# Patient Record
Sex: Female | Born: 1950 | Race: Black or African American | Hispanic: No | Marital: Married | Smoking: Former smoker
Health system: Southern US, Community
[De-identification: ages and names within clinical notes are randomized; demographics above are authoritative.]

## PROBLEM LIST (undated history)

## (undated) DIAGNOSIS — J302 Other seasonal allergic rhinitis: Secondary | ICD-10-CM

## (undated) DIAGNOSIS — G629 Polyneuropathy, unspecified: Secondary | ICD-10-CM

## (undated) DIAGNOSIS — J189 Pneumonia, unspecified organism: Secondary | ICD-10-CM

## (undated) DIAGNOSIS — I10 Essential (primary) hypertension: Secondary | ICD-10-CM

## (undated) DIAGNOSIS — S0990XA Unspecified injury of head, initial encounter: Secondary | ICD-10-CM

## (undated) DIAGNOSIS — E113521 Type 2 diabetes mellitus with proliferative diabetic retinopathy with traction retinal detachment involving the macula, right eye: Secondary | ICD-10-CM

## (undated) DIAGNOSIS — Z8719 Personal history of other diseases of the digestive system: Secondary | ICD-10-CM

## (undated) DIAGNOSIS — D649 Anemia, unspecified: Secondary | ICD-10-CM

## (undated) DIAGNOSIS — Z9889 Other specified postprocedural states: Secondary | ICD-10-CM

## (undated) DIAGNOSIS — J45909 Unspecified asthma, uncomplicated: Secondary | ICD-10-CM

## (undated) DIAGNOSIS — M1990 Unspecified osteoarthritis, unspecified site: Secondary | ICD-10-CM

## (undated) DIAGNOSIS — R112 Nausea with vomiting, unspecified: Secondary | ICD-10-CM

## (undated) DIAGNOSIS — R0989 Other specified symptoms and signs involving the circulatory and respiratory systems: Secondary | ICD-10-CM

## (undated) DIAGNOSIS — N186 End stage renal disease: Secondary | ICD-10-CM

## (undated) DIAGNOSIS — IMO0001 Reserved for inherently not codable concepts without codable children: Secondary | ICD-10-CM

## (undated) DIAGNOSIS — K5792 Diverticulitis of intestine, part unspecified, without perforation or abscess without bleeding: Secondary | ICD-10-CM

## (undated) DIAGNOSIS — F419 Anxiety disorder, unspecified: Secondary | ICD-10-CM

## (undated) DIAGNOSIS — K219 Gastro-esophageal reflux disease without esophagitis: Secondary | ICD-10-CM

## (undated) DIAGNOSIS — T4145XA Adverse effect of unspecified anesthetic, initial encounter: Secondary | ICD-10-CM

## (undated) DIAGNOSIS — J449 Chronic obstructive pulmonary disease, unspecified: Secondary | ICD-10-CM

## (undated) DIAGNOSIS — G4730 Sleep apnea, unspecified: Secondary | ICD-10-CM

## (undated) DIAGNOSIS — M5090 Cervical disc disorder, unspecified, unspecified cervical region: Secondary | ICD-10-CM

## (undated) DIAGNOSIS — M810 Age-related osteoporosis without current pathological fracture: Secondary | ICD-10-CM

## (undated) DIAGNOSIS — K922 Gastrointestinal hemorrhage, unspecified: Secondary | ICD-10-CM

## (undated) DIAGNOSIS — E785 Hyperlipidemia, unspecified: Secondary | ICD-10-CM

## (undated) DIAGNOSIS — I739 Peripheral vascular disease, unspecified: Secondary | ICD-10-CM

## (undated) DIAGNOSIS — I509 Heart failure, unspecified: Secondary | ICD-10-CM

## (undated) DIAGNOSIS — T8859XA Other complications of anesthesia, initial encounter: Secondary | ICD-10-CM

## (undated) HISTORY — DX: Other specified symptoms and signs involving the circulatory and respiratory systems: R09.89

## (undated) HISTORY — PX: ABDOMINAL HYSTERECTOMY: SHX81

## (undated) HISTORY — DX: Type 2 diabetes mellitus with proliferative diabetic retinopathy with traction retinal detachment involving the macula, right eye: E11.3521

## (undated) HISTORY — PX: EYE SURGERY: SHX253

## (undated) HISTORY — DX: Age-related osteoporosis without current pathological fracture: M81.0

---

## 1998-06-14 ENCOUNTER — Emergency Department (HOSPITAL_COMMUNITY): Admission: EM | Admit: 1998-06-14 | Discharge: 1998-06-14 | Payer: Self-pay | Admitting: Emergency Medicine

## 1998-06-14 ENCOUNTER — Encounter: Payer: Self-pay | Admitting: Emergency Medicine

## 1998-06-17 ENCOUNTER — Encounter: Admission: RE | Admit: 1998-06-17 | Discharge: 1998-09-15 | Payer: Self-pay | Admitting: *Deleted

## 1998-07-03 ENCOUNTER — Emergency Department (HOSPITAL_COMMUNITY): Admission: EM | Admit: 1998-07-03 | Discharge: 1998-07-03 | Payer: Self-pay | Admitting: Emergency Medicine

## 1998-07-03 ENCOUNTER — Encounter: Payer: Self-pay | Admitting: Emergency Medicine

## 2000-12-12 ENCOUNTER — Emergency Department (HOSPITAL_COMMUNITY): Admission: EM | Admit: 2000-12-12 | Discharge: 2000-12-12 | Payer: Self-pay | Admitting: Emergency Medicine

## 2001-07-18 ENCOUNTER — Encounter: Payer: Self-pay | Admitting: Emergency Medicine

## 2001-07-18 ENCOUNTER — Emergency Department (HOSPITAL_COMMUNITY): Admission: EM | Admit: 2001-07-18 | Discharge: 2001-07-18 | Payer: Self-pay | Admitting: Emergency Medicine

## 2002-07-01 ENCOUNTER — Emergency Department (HOSPITAL_COMMUNITY): Admission: EM | Admit: 2002-07-01 | Discharge: 2002-07-01 | Payer: Self-pay | Admitting: Emergency Medicine

## 2002-07-26 ENCOUNTER — Encounter: Payer: Self-pay | Admitting: Urology

## 2002-07-31 ENCOUNTER — Observation Stay (HOSPITAL_COMMUNITY): Admission: AD | Admit: 2002-07-31 | Discharge: 2002-08-01 | Payer: Self-pay | Admitting: Urology

## 2005-06-10 ENCOUNTER — Emergency Department (HOSPITAL_COMMUNITY): Admission: EM | Admit: 2005-06-10 | Discharge: 2005-06-10 | Payer: Self-pay | Admitting: Emergency Medicine

## 2005-08-10 ENCOUNTER — Ambulatory Visit (HOSPITAL_COMMUNITY): Admission: RE | Admit: 2005-08-10 | Discharge: 2005-08-10 | Payer: Self-pay | Admitting: Cardiovascular Disease

## 2007-12-27 ENCOUNTER — Ambulatory Visit: Payer: Self-pay | Admitting: Internal Medicine

## 2007-12-27 DIAGNOSIS — J301 Allergic rhinitis due to pollen: Secondary | ICD-10-CM | POA: Insufficient documentation

## 2007-12-27 DIAGNOSIS — I10 Essential (primary) hypertension: Secondary | ICD-10-CM | POA: Insufficient documentation

## 2007-12-27 DIAGNOSIS — E1169 Type 2 diabetes mellitus with other specified complication: Secondary | ICD-10-CM | POA: Insufficient documentation

## 2007-12-27 DIAGNOSIS — J45909 Unspecified asthma, uncomplicated: Secondary | ICD-10-CM | POA: Insufficient documentation

## 2007-12-27 DIAGNOSIS — E119 Type 2 diabetes mellitus without complications: Secondary | ICD-10-CM | POA: Insufficient documentation

## 2007-12-27 DIAGNOSIS — Z8679 Personal history of other diseases of the circulatory system: Secondary | ICD-10-CM | POA: Insufficient documentation

## 2007-12-27 DIAGNOSIS — E785 Hyperlipidemia, unspecified: Secondary | ICD-10-CM

## 2008-01-03 ENCOUNTER — Ambulatory Visit: Payer: Self-pay | Admitting: Internal Medicine

## 2008-04-12 ENCOUNTER — Ambulatory Visit: Payer: Self-pay | Admitting: Internal Medicine

## 2008-05-10 ENCOUNTER — Telehealth: Payer: Self-pay | Admitting: Internal Medicine

## 2008-05-22 ENCOUNTER — Encounter: Payer: Self-pay | Admitting: Internal Medicine

## 2008-11-29 ENCOUNTER — Inpatient Hospital Stay (HOSPITAL_COMMUNITY): Admission: EM | Admit: 2008-11-29 | Discharge: 2008-12-04 | Payer: Self-pay | Admitting: Emergency Medicine

## 2008-12-06 ENCOUNTER — Encounter: Admission: RE | Admit: 2008-12-06 | Discharge: 2008-12-06 | Payer: Self-pay | Admitting: Internal Medicine

## 2009-01-09 ENCOUNTER — Encounter: Admission: RE | Admit: 2009-01-09 | Discharge: 2009-01-09 | Payer: Self-pay | Admitting: Internal Medicine

## 2009-10-15 ENCOUNTER — Emergency Department (HOSPITAL_COMMUNITY): Admission: EM | Admit: 2009-10-15 | Discharge: 2009-10-15 | Payer: Self-pay | Admitting: Emergency Medicine

## 2010-01-01 ENCOUNTER — Emergency Department (HOSPITAL_COMMUNITY): Admission: EM | Admit: 2010-01-01 | Discharge: 2010-01-01 | Payer: Self-pay | Admitting: Emergency Medicine

## 2010-08-17 ENCOUNTER — Encounter: Payer: Self-pay | Admitting: Internal Medicine

## 2010-08-26 NOTE — Assessment & Plan Note (Signed)
Summary: 2 MONTH ROV/NJR/resch with tp/jls   Vital Signs:  Patient Profile:   60 Years Old Female Height:     66 inches Weight:      150 pounds Temp:     98.3 degrees F oral Pulse rate:   117 / minute BP sitting:   160 / 120  (left arm) Cuff size:   regular  Vitals Entered By: Julious Payer (April 12, 2008 3:39 PM)                 Chief Complaint:  2 month rov.  History of Present Illness: 60 year old female seen today for follow-up of her diabetes, hypertension, and dyslipidemia.  Still no home blood sugar monitoring, blood pressure on arrival to that was quite high.  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  She has asthma which has been stable    Current Allergies: No known allergies   Past Medical History:    Reviewed history from 12/27/2007 and no changes required:       Asthma       Diabetes mellitus, type II       Hyperlipidemia       Hypertension       Allergic rhinitis   Family History:    Reviewed history from 12/27/2007 and no changes required:       father  age  35, unclear  health              Without a 74 history of hypertension; died of cancer, unclear type              one brother in good health    Review of Systems  The patient denies anorexia, fever, weight loss, weight gain, vision loss, decreased hearing, hoarseness, chest pain, syncope, dyspnea on exertion, peripheral edema, prolonged cough, headaches, hemoptysis, abdominal pain, melena, hematochezia, severe indigestion/heartburn, hematuria, incontinence, genital sores, muscle weakness, suspicious skin lesions, transient blindness, difficulty walking, depression, unusual weight change, abnormal bleeding, enlarged lymph nodes, angioedema, and breast masses.     Physical Exam  General:     well-developed.  repeat blood pressure 160/100 Head:     Normocephalic and atraumatic without obvious abnormalities. No apparent alopecia or balding. Eyes:     No corneal or  conjunctival inflammation noted. EOMI. Perrla. Funduscopic exam benign, without hemorrhages, exudates or papilledema. Vision grossly normal. Mouth:     Oral mucosa and oropharynx without lesions or exudates.  Teeth in good repair. Neck:     No deformities, masses, or tenderness noted. Lungs:     Normal respiratory effort, chest expands symmetrically. Lungs are clear to auscultation, no crackles or wheezes. Heart:     Normal rate and regular rhythm. S1 and S2 normal without gallop, murmur, click, rub or other extra sounds. Abdomen:     Bowel sounds positive,abdomen soft and non-tender without masses, organomegaly or hernias noted.    Impression & Recommendations:  Problem # 1:  HYPERTENSION (ICD-401.9)  The following medications were removed from the medication list:    Lisinopril-hydrochlorothiazide 20-25 Mg Tabs (Lisinopril-hydrochlorothiazide) ..... One daily  Her updated medication list for this problem includes:    Lisinopril-hydrochlorothiazide 20-12.5 Mg Tabs (Lisinopril-hydrochlorothiazide) .Marland Kitchen..Marland Kitchen Two daily compliance issues stressed  Problem # 2:  DIABETES MELLITUS, TYPE II (ICD-250.00)  The following medications were removed from the medication list:    Lisinopril-hydrochlorothiazide 20-25 Mg Tabs (Lisinopril-hydrochlorothiazide) ..... One daily  Her updated medication list for this problem includes:    Glimepiride 4 Mg  Tabs (Glimepiride) ..... One every am    Metformin Hcl 500 Mg Tb24 (Metformin hcl) .Marland Kitchen... 2  twice daily    Lisinopril-hydrochlorothiazide 20-12.5 Mg Tabs (Lisinopril-hydrochlorothiazide) .Marland Kitchen..Marland Kitchen Two daily a complementary glucometer, and new prescriptions for test strips and lancets, dispensed   Problem # 3:  ASTHMA (ICD-493.90)  Her updated medication list for this problem includes:    Proair Hfa 108 (90 Base) Mcg/act Aers (Albuterol sulfate) .Marland Kitchen... As directed   Problem # 4:  HYPERLIPIDEMIA (B2193296.4)  Her updated medication list for this problem  includes:    Simvastatin 40 Mg Tabs (Simvastatin) ..... One daily   Complete Medication List: 1)  Amlodipine Besylate 10 Mg Tabs (amlodipine Besylate)  .... One daily 2)  Glimepiride 4 Mg Tabs (Glimepiride) .... One every am 3)  Metformin Hcl 500 Mg Tb24 (Metformin hcl) .... 2  twice daily 4)  Simvastatin 40 Mg Tabs (Simvastatin) .... One daily 5)  Proair Hfa 108 (90 Base) Mcg/act Aers (Albuterol sulfate) .... As directed 6)  Lisinopril-hydrochlorothiazide 20-12.5 Mg Tabs (Lisinopril-hydrochlorothiazide) .... Two daily 7)  Freestyle Lite Test Strp (Glucose blood) .... Use daily 8)  Monolet Lancets Misc (Lancets) .... Use as directed   Patient Instructions: 1)  Please schedule a follow-up appointment in 1 month. 2)  Limit your Sodium (Salt) to less than 2 grams a day(slightly less than 1/2 a teaspoon) to prevent fluid retention, swelling, or worsening of symptoms. 3)  Check your blood sugars regularly. If your readings are usually above : or below 70 you should contact our office. 4)  It is important that your Diabetic A1c level is checked every 3 months. 5)  See your eye doctor yearly to check for diabetic eye damage.   Prescriptions: MONOLET LANCETS  MISC (LANCETS) use as directed  #90 x 6   Entered and Authorized by:   Marletta Lor  MD   Signed by:   Marletta Lor  MD on 04/12/2008   Method used:   Print then Give to Patient   RxID:   [id number] FREESTYLE LITE TEST  STRP (GLUCOSE BLOOD) use daily  #90 x 6   Entered and Authorized by:   Marletta Lor  MD   Signed by:   Marletta Lor  MD on 04/12/2008   Method used:   Print then Give to Patient   RxID:   [id number] LISINOPRIL-HYDROCHLOROTHIAZIDE 20-12.5 MG TABS (LISINOPRIL-HYDROCHLOROTHIAZIDE) two daily  #180 x 6   Entered and Authorized by:   Marletta Lor  MD   Signed by:   Marletta Lor  MD on 04/12/2008   Method used:   Print then Give to Patient   [id number]  ]

## 2010-08-26 NOTE — Letter (Signed)
Summary: [facility]  [facility]   Imported By: Laural Benes 06/14/2008 13:47:16  _____________________________________________________________________  External Attachment:    Type:   Image     Comment:   External Document

## 2010-08-26 NOTE — Assessment & Plan Note (Signed)
Summary: new pt to establish/[employer]/CCM   Vital Signs:  Patient Profile:   60 Years Old Female Height:     66 inches Weight:      158 pounds Temp:     97.2 degrees F oral BP sitting:   220 / 110  (left arm) Cuff size:   regular  Vitals Entered By: Nira Conn LPN (June  2, 579FGE 579FGE PM)                 Chief Complaint:  to establish and HBP concerns.  History of Present Illness: 60 year old, black female, seen today to establish with our practice.  She has a long history of type 2 diabetes, asthma, hypertension, and dyslipidemia.  There has been much confusion about  her medications and difficulty with compliance due to cost issues. She does receive annual gynecologic checks mammograms and eye exams.  She states that she does not tolerate clonidine due to drowsiness.  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      Physical Exam  General:     Well-developed,well-nourished,in no acute distress; alert,appropriate and cooperative throughout examination ;blood pressure 200 100 Head:     Normocephalic and atraumatic without obvious abnormalities. No apparent alopecia or balding. Eyes:     No corneal or conjunctival inflammation noted. EOMI. Perrla. Funduscopic exam benign, without hemorrhages, exudates or papilledema. Vision grossly normal. Ears:     External ear exam shows no significant lesions or deformities.  Otoscopic examination reveals clear canals, tympanic membranes are intact bilaterally without bulging, retraction, inflammation or discharge. Hearing is grossly normal bilaterally. Mouth:     scattered dentition in poor repair Neck:     No deformities, masses, or tenderness noted. Chest Wall:     No deformities, masses, or tenderness noted. Breasts:     No mass, nodules, thickening, tenderness, bulging, retraction, inflamation, nipple discharge or skin changes noted.   Lungs:     Normal respiratory effort, chest expands symmetrically. Lungs are clear to auscultation, no crackles or wheezes. Heart:     sinus tachycardia, no gallop Abdomen:     Bowel sounds positive,abdomen soft and non-tender without masses, organomegaly or hernias noted. Msk:     No deformity or scoliosis noted of thoracic or lumbar spine.   Pulses:     R and L  carotid,radial,femoral,dorsalis pedis and posterior tibial pulses are full and equal bilaterally Extremities:     No clubbing, cyanosis, edema, or deformity noted with normal full range of motion of all joints.      Impression & Recommendations:  Problem # 1:  HYPERTENSION, HX OF (ICD-V12.50)  Orders: EKG w/ Interpretation (93000) extremely poor control; suspect  compliance with her medications.  A huge issue, both in terms of cost, and side effects  Problem # 2:  DIABETES MELLITUS, TYPE II (ICD-250.00)  Her updated medication list for this problem  includes:    Lisinopril-hydrochlorothiazide 20-25 Mg Tabs (Lisinopril-hydrochlorothiazide) ..... One daily    Glimepiride 4 Mg Tabs (Glimepiride) ..... One every am    Metformin Hcl 500 Mg Tb24 (Metformin hcl) .Marland Kitchen... 2  twice daily poor  control;  Orders: Glucose, (CBG) [id number])   Problem # 3:  HYPERLIPIDEMIA (ICD-272.4)  Her updated medication list for this problem includes:    Simvastatin 40 Mg Tabs (Simvastatin) ..... One daily   Complete Medication List: 1)  Lisinopril-hydrochlorothiazide 20-25 Mg Tabs (Lisinopril-hydrochlorothiazide) .... One daily 2)  Amlodipine Besylate 5 Mg Tabs (Amlodipine besylate) .... One daily 3)  Glimepiride 4 Mg Tabs (Glimepiride) .... One every am 4)  Metformin Hcl 500 Mg Tb24 (Metformin hcl) .... 2  twice daily 5)  Simvastatin 40 Mg Tabs (Simvastatin) .... One daily 6)  Proair Hfa 108 (90 Base) Mcg/act Aers (Albuterol sulfate) .... As directed   Patient Instructions: 1)  return office visit in one week 2)  Limit your Sodium (Salt) to less than 2 grams a day(slightly less than 1/2 a teaspoon) to prevent fluid retention, swelling, or worsening of symptoms. 3)  It is important that you exercise regularly at least 20 minutes 5 times a week. If you develop chest pain, have severe difficulty breathing, or feel very tired , stop exercising immediately and seek medical attention. 4)  Schedule a colonoscopy/sigmoidoscopy to help detect colon cancer. 5)  Check your blood sugars regularly. If your readings are usually above : or below 70 you should contact our office. 6)  It is important that your Diabetic A1c level is checked every 3 months. 7)  See your eye doctor yearly to check for diabetic eye damage.   Prescriptions: PROAIR HFA 108 (90 BASE) MCG/ACT  AERS (ALBUTEROL SULFATE) as directed  #3 x 4   Entered and Authorized by:   Marletta Lor  MD   Signed by:   Marletta Lor  MD on 12/27/2007   Method used:   Print then Give to Patient   RxID:    [id number] SIMVASTATIN 40 MG  TABS (SIMVASTATIN) one daily  #90 x 4   Entered and Authorized by:   Marletta Lor  MD   Signed by:   Marletta Lor  MD on 12/27/2007   Method used:   Print then Give to Patient   RxID:   [id number] METFORMIN HCL 500 MG  TB24 (METFORMIN HCL) 2  twice daily  #180 x 4   Entered and Authorized by:   Marletta Lor  MD   Signed by:   Marletta Lor  MD on 12/27/2007   Method used:   Print then Give to Patient   RxID:   [id number] GLIMEPIRIDE 4 MG  TABS (GLIMEPIRIDE) one every am  #90 x 4   Entered and Authorized by:   Marletta Lor  MD   Signed by:   Marletta Lor  MD on 12/27/2007  Method used:   Print then Give to Patient   RxID:   [id number] AMLODIPINE BESYLATE 5 MG  TABS (AMLODIPINE BESYLATE) one daily  #90 x 4   Entered and Authorized by:   Marletta Lor  MD   Signed by:   Marletta Lor  MD on 12/27/2007   Method used:   Print then Give to Patient   RxID:   [id number] LISINOPRIL-HYDROCHLOROTHIAZIDE 20-25 MG  TABS (LISINOPRIL-HYDROCHLOROTHIAZIDE) one daily  #90 x 4   Entered and Authorized by:   Marletta Lor  MD   Signed by:   Marletta Lor  MD on 12/27/2007   Method used:   Print then Give to Patient   RxID:   [id number]  ]

## 2010-08-26 NOTE — Assessment & Plan Note (Signed)
Summary: 1 wk f/up//db   Vital Signs:  Patient Profile:   60 Years Old Female Height:     66 inches Weight:      158 pounds Temp:     98.3 degrees F Pulse rate:   128 / minute Pulse rhythm:   irregular BP sitting:   180 / 78  (left arm) Cuff size:   regular  Vitals Entered By: Chipper Oman, RN (January 03, 2008 3:22 PM)                 Chief Complaint:  1 week ROV.Marland Kitchen  History of Present Illness: 60 year old female seen today for follow-up of her hypertension and type 2 diabetes.  Has no test strips to check blood sugars.  She does feel well today    Current Allergies: No known allergies       Physical Exam  General:     Well-developed,well-nourished,in no acute distress; alert,appropriate and cooperative throughout examination;  somewhat anxious, but pressure 160/80 Head:     Normocephalic and atraumatic without obvious abnormalities. No apparent alopecia or balding. Neck:     No deformities, masses, or tenderness noted. Lungs:     Normal respiratory effort, chest expands symmetrically. Lungs are clear to auscultation, no crackles or wheezes. Heart:     Normal rate and regular rhythm. S1 and S2 normal without gallop, murmur, click, rub or other extra sounds. regular tachycardia with rate of 110    Impression & Recommendations:  Problem # 1:  HYPERTENSION (ICD-401.9)  Her updated medication list for this problem includes:    Lisinopril-hydrochlorothiazide 20-25 Mg Tabs (Lisinopril-hydrochlorothiazide) ..... One daily   Problem # 2:  DIABETES MELLITUS, TYPE II (ICD-250.00)  Her updated medication list for this problem includes:    Lisinopril-hydrochlorothiazide 20-25 Mg Tabs (Lisinopril-hydrochlorothiazide) ..... One daily    Glimepiride 4 Mg Tabs (Glimepiride) ..... One every am    Metformin Hcl 500 Mg Tb24 (Metformin hcl) .Marland Kitchen... 2  twice daily   Complete Medication List: 1)  Lisinopril-hydrochlorothiazide 20-25 Mg Tabs (Lisinopril-hydrochlorothiazide)  .... One daily 2)  Amlodipine Besylate 10 Mg Tabs (amlodipine Besylate)  .... One daily 3)  Glimepiride 4 Mg Tabs (Glimepiride) .... One every am 4)  Metformin Hcl 500 Mg Tb24 (Metformin hcl) .... 2  twice daily 5)  Simvastatin 40 Mg Tabs (Simvastatin) .... One daily 6)  Proair Hfa 108 (90 Base) Mcg/act Aers (Albuterol sulfate) .... As directed   Patient Instructions: 1)  Please schedule a follow-up appointment in 2 months. 2)  Limit your Sodium (Salt) to less than 2 grams a day(slightly less than 1/2 a teaspoon) to prevent fluid retention, swelling, or worsening of symptoms. 3)  It is important that you exercise regularly at least 20 minutes 5 times a week. If you develop chest pain, have severe difficulty breathing, or feel very tired , stop exercising immediately and seek medical attention. 4)  You need to lose weight. Consider a lower calorie diet and regular exercise.  5)  Check your blood sugars regularly. If your readings are usually above : or below 70 you should contact our office. 6)  It is important that your Diabetic A1c level is checked every 3 months. 7)  See your eye doctor yearly to check for diabetic eye damage.   Prescriptions: AMLODIPINE BESYLATE 10 MG  TABS (AMLODIPINE BESYLATE) one daily  #90 x 6   Entered and Authorized by:   Marletta Lor  MD   Signed by:   Collier Salina  Sherwood Gambler  MD on 01/03/2008   Method used:   Print then Give to Patient   RxID:   [id number]  ]

## 2010-10-13 LAB — URINE MICROSCOPIC-ADD ON

## 2010-10-13 LAB — DIFFERENTIAL
Basophils Absolute: 0 10*3/uL (ref 0.0–0.1)
Basophils Relative: 1 % (ref 0–1)
Eosinophils Absolute: 0.1 10*3/uL (ref 0.0–0.7)
Eosinophils Relative: 1 % (ref 0–5)
Lymphocytes Relative: 30 % (ref 12–46)
Lymphs Abs: 2.3 10*3/uL (ref 0.7–4.0)
Monocytes Absolute: 0.4 10*3/uL (ref 0.1–1.0)
Monocytes Relative: 5 % (ref 3–12)
Neutro Abs: 4.9 10*3/uL (ref 1.7–7.7)
Neutrophils Relative %: 64 % (ref 43–77)

## 2010-10-13 LAB — BASIC METABOLIC PANEL
BUN: 8 mg/dL (ref 6–23)
CO2: 35 mEq/L — ABNORMAL HIGH (ref 19–32)
Calcium: 9.1 mg/dL (ref 8.4–10.5)
Chloride: 91 mEq/L — ABNORMAL LOW (ref 96–112)
Creatinine, Ser: 0.52 mg/dL (ref 0.4–1.2)
GFR calc Af Amer: >60 mL/min (ref >60)
GFR calc non Af Amer: >60 mL/min (ref >60)
Glucose, Bld: 175 mg/dL — ABNORMAL HIGH (ref 70–99)
Potassium: 3 mEq/L — ABNORMAL LOW (ref 3.5–5.1)
Sodium: 133 mEq/L — ABNORMAL LOW (ref 135–145)

## 2010-10-13 LAB — URINALYSIS, ROUTINE W REFLEX MICROSCOPIC
Bilirubin Urine: NEGATIVE
Glucose, UA: NEGATIVE mg/dL
Hgb urine dipstick: NEGATIVE
Ketones, ur: NEGATIVE mg/dL
Leukocytes, UA: NEGATIVE
Nitrite: NEGATIVE
Protein, ur: 100 mg/dL — AB
Specific Gravity, Urine: 1.012 (ref 1.005–1.030)
Urobilinogen, UA: 1 mg/dL (ref 0.0–1.0)
pH: 8 (ref 5.0–8.0)

## 2010-10-13 LAB — CBC
HCT: 40.6 % (ref 36.0–46.0)
Hemoglobin: 13.6 g/dL (ref 12.0–15.0)
MCHC: 33.5 g/dL (ref 30.0–36.0)
MCV: 90.8 fL (ref 78.0–100.0)
Platelets: 401 10*3/uL — ABNORMAL HIGH (ref 150–400)
RBC: 4.47 MIL/uL (ref 3.87–5.11)
RDW: 12.6 % (ref 11.5–15.5)
WBC: 7.7 10*3/uL (ref 4.0–10.5)

## 2010-10-20 LAB — POCT I-STAT, CHEM 8
BUN: 7 mg/dL (ref 6–23)
Calcium, Ion: 1.07 mmol/L — ABNORMAL LOW (ref 1.12–1.32)
Chloride: 92 mEq/L — ABNORMAL LOW (ref 96–112)
Creatinine, Ser: 0.7 mg/dL (ref 0.4–1.2)
Glucose, Bld: 394 mg/dL — ABNORMAL HIGH (ref 70–99)
HCT: 43 % (ref 36.0–46.0)
Hemoglobin: 14.6 g/dL (ref 12.0–15.0)
Potassium: 3.5 mEq/L (ref 3.5–5.1)
Sodium: 132 mEq/L — ABNORMAL LOW (ref 135–145)
TCO2: 38 mmol/L (ref 0–100)

## 2010-10-20 LAB — GLUCOSE, CAPILLARY: Glucose-Capillary: 394 mg/dL — ABNORMAL HIGH (ref 70–99)

## 2010-10-24 ENCOUNTER — Observation Stay (HOSPITAL_COMMUNITY)
Admission: EM | Admit: 2010-10-24 | Discharge: 2010-10-25 | Disposition: A | Discharge status: Home / Self Care | Payer: BC Managed Care – PPO | Attending: Emergency Medicine | Admitting: Emergency Medicine

## 2010-10-24 ENCOUNTER — Emergency Department (HOSPITAL_COMMUNITY): Payer: BC Managed Care – PPO

## 2010-10-24 ENCOUNTER — Encounter (HOSPITAL_COMMUNITY): Payer: Self-pay | Admitting: Radiology

## 2010-10-24 DIAGNOSIS — Z9119 Patient's noncompliance with other medical treatment and regimen: Secondary | ICD-10-CM | POA: Insufficient documentation

## 2010-10-24 DIAGNOSIS — E119 Type 2 diabetes mellitus without complications: Principal | ICD-10-CM | POA: Insufficient documentation

## 2010-10-24 DIAGNOSIS — R1110 Vomiting, unspecified: Secondary | ICD-10-CM | POA: Insufficient documentation

## 2010-10-24 DIAGNOSIS — R0602 Shortness of breath: Secondary | ICD-10-CM | POA: Insufficient documentation

## 2010-10-24 DIAGNOSIS — Z794 Long term (current) use of insulin: Secondary | ICD-10-CM | POA: Insufficient documentation

## 2010-10-24 DIAGNOSIS — Z91199 Patient's noncompliance with other medical treatment and regimen due to unspecified reason: Secondary | ICD-10-CM | POA: Insufficient documentation

## 2010-10-24 DIAGNOSIS — E876 Hypokalemia: Secondary | ICD-10-CM | POA: Insufficient documentation

## 2010-10-24 DIAGNOSIS — E860 Dehydration: Secondary | ICD-10-CM | POA: Insufficient documentation

## 2010-10-24 HISTORY — DX: Heart failure, unspecified: I50.9

## 2010-10-24 LAB — DIFFERENTIAL
Basophils Absolute: 0 10*3/uL (ref 0.0–0.1)
Basophils Relative: 0 % (ref 0–1)
Eosinophils Absolute: 0.1 10*3/uL (ref 0.0–0.7)
Eosinophils Relative: 1 % (ref 0–5)
Lymphocytes Relative: 32 % (ref 12–46)
Lymphs Abs: 2.9 10*3/uL (ref 0.7–4.0)
Monocytes Absolute: 0.5 10*3/uL (ref 0.1–1.0)
Monocytes Relative: 6 % (ref 3–12)
Neutro Abs: 5.7 10*3/uL (ref 1.7–7.7)
Neutrophils Relative %: 62 % (ref 43–77)

## 2010-10-24 LAB — COMPREHENSIVE METABOLIC PANEL
ALT: 27 U/L (ref 0–35)
AST: 21 U/L (ref 0–37)
Albumin: 2.8 g/dL — ABNORMAL LOW (ref 3.5–5.2)
Alkaline Phosphatase: 306 U/L — ABNORMAL HIGH (ref 39–117)
BUN: 15 mg/dL (ref 6–23)
CO2: 37 mEq/L — ABNORMAL HIGH (ref 19–32)
Calcium: 8.4 mg/dL (ref 8.4–10.5)
Chloride: 84 mEq/L — ABNORMAL LOW (ref 96–112)
Creatinine, Ser: 1.17 mg/dL (ref 0.4–1.2)
GFR calc Af Amer: 57 mL/min — ABNORMAL LOW (ref >60)
GFR calc non Af Amer: 47 mL/min — ABNORMAL LOW (ref >60)
Glucose, Bld: 456 mg/dL — ABNORMAL HIGH (ref 70–99)
Potassium: 2.7 mEq/L — CL (ref 3.5–5.1)
Sodium: 128 mEq/L — ABNORMAL LOW (ref 135–145)
Total Bilirubin: 0.4 mg/dL (ref 0.3–1.2)
Total Protein: 6.1 g/dL (ref 6.0–8.3)

## 2010-10-24 LAB — URINALYSIS, ROUTINE W REFLEX MICROSCOPIC
Bilirubin Urine: NEGATIVE
Glucose, UA: >1000 mg/dL — AB
Hgb urine dipstick: NEGATIVE
Ketones, ur: NEGATIVE mg/dL
Leukocytes, UA: NEGATIVE
Nitrite: NEGATIVE
Protein, ur: >300 mg/dL — AB
Specific Gravity, Urine: 1.023 (ref 1.005–1.030)
Urobilinogen, UA: 1 mg/dL (ref 0.0–1.0)
pH: 5.5 (ref 5.0–8.0)

## 2010-10-24 LAB — GLUCOSE, CAPILLARY
Glucose-Capillary: 310 mg/dL — ABNORMAL HIGH (ref 70–99)
Glucose-Capillary: 206 mg/dL — ABNORMAL HIGH (ref 70–99)

## 2010-10-24 LAB — CBC
HCT: 34.3 % — ABNORMAL LOW (ref 36.0–46.0)
Hemoglobin: 11.7 g/dL — ABNORMAL LOW (ref 12.0–15.0)
MCH: 28.9 pg (ref 26.0–34.0)
MCHC: 34.1 g/dL (ref 30.0–36.0)
MCV: 84.7 fL (ref 78.0–100.0)
Platelets: 376 10*3/uL (ref 150–400)
RBC: 4.05 MIL/uL (ref 3.87–5.11)
RDW: 12.3 % (ref 11.5–15.5)
WBC: 9.3 10*3/uL (ref 4.0–10.5)

## 2010-10-24 LAB — BASIC METABOLIC PANEL
BUN: 12 mg/dL (ref 6–23)
CO2: 36 mEq/L — ABNORMAL HIGH (ref 19–32)
Calcium: 8.1 mg/dL — ABNORMAL LOW (ref 8.4–10.5)
Chloride: 92 mEq/L — ABNORMAL LOW (ref 96–112)
Creatinine, Ser: 0.86 mg/dL (ref 0.4–1.2)
GFR calc Af Amer: >60 mL/min (ref >60)
GFR calc non Af Amer: >60 mL/min (ref >60)
Glucose, Bld: 166 mg/dL — ABNORMAL HIGH (ref 70–99)
Potassium: 2.5 mEq/L — CL (ref 3.5–5.1)
Sodium: 136 mEq/L (ref 135–145)

## 2010-10-24 LAB — POCT CARDIAC MARKERS
CKMB, poc: 1.9 ng/mL (ref 1.0–8.0)
Myoglobin, poc: 92.7 ng/mL (ref 12–200)
Troponin i, poc: <0.05 ng/mL (ref 0.00–0.09)

## 2010-10-24 LAB — POCT I-STAT 3, VENOUS BLOOD GAS (G3P V)
Acid-Base Excess: 14 mmol/L — ABNORMAL HIGH (ref 0.0–2.0)
Bicarbonate: 41 mEq/L — ABNORMAL HIGH (ref 20.0–24.0)
O2 Saturation: 53 %
Patient temperature: 98.4
TCO2: 43 mmol/L (ref 0–100)
pCO2, Ven: 60.6 mmHg — ABNORMAL HIGH (ref 45.0–50.0)
pH, Ven: 7.438 — ABNORMAL HIGH (ref 7.250–7.300)
pO2, Ven: 28 mmHg — CL (ref 30.0–45.0)

## 2010-10-24 LAB — URINE MICROSCOPIC-ADD ON

## 2010-10-25 LAB — BASIC METABOLIC PANEL
BUN: 13 mg/dL (ref 6–23)
CO2: 31 mEq/L (ref 19–32)
Calcium: 8 mg/dL — ABNORMAL LOW (ref 8.4–10.5)
Chloride: 96 mEq/L (ref 96–112)
Creatinine, Ser: 0.8 mg/dL (ref 0.4–1.2)
GFR calc Af Amer: >60 mL/min (ref >60)
GFR calc non Af Amer: >60 mL/min (ref >60)
Glucose, Bld: 285 mg/dL — ABNORMAL HIGH (ref 70–99)
Potassium: 3.2 mEq/L — ABNORMAL LOW (ref 3.5–5.1)
Sodium: 134 mEq/L — ABNORMAL LOW (ref 135–145)

## 2010-11-04 LAB — GLUCOSE, CAPILLARY
Comment 3: 0
Glucose-Capillary: 103 mg/dL — ABNORMAL HIGH (ref 70–99)
Glucose-Capillary: 155 mg/dL — ABNORMAL HIGH (ref 70–99)
Glucose-Capillary: 157 mg/dL — ABNORMAL HIGH (ref 70–99)
Glucose-Capillary: 158 mg/dL — ABNORMAL HIGH (ref 70–99)
Glucose-Capillary: 165 mg/dL — ABNORMAL HIGH (ref 70–99)
Glucose-Capillary: 166 mg/dL — ABNORMAL HIGH (ref 70–99)
Glucose-Capillary: 180 mg/dL — ABNORMAL HIGH (ref 70–99)
Glucose-Capillary: 194 mg/dL — ABNORMAL HIGH (ref 70–99)
Glucose-Capillary: 195 mg/dL — ABNORMAL HIGH (ref 70–99)
Glucose-Capillary: 205 mg/dL — ABNORMAL HIGH (ref 70–99)
Glucose-Capillary: 207 mg/dL — ABNORMAL HIGH (ref 70–99)
Glucose-Capillary: 212 mg/dL — ABNORMAL HIGH (ref 70–99)
Glucose-Capillary: 218 mg/dL — ABNORMAL HIGH (ref 70–99)
Glucose-Capillary: 229 mg/dL — ABNORMAL HIGH (ref 70–99)
Glucose-Capillary: 230 mg/dL — ABNORMAL HIGH (ref 70–99)
Glucose-Capillary: 232 mg/dL — ABNORMAL HIGH (ref 70–99)
Glucose-Capillary: 235 mg/dL — ABNORMAL HIGH (ref 70–99)
Glucose-Capillary: 254 mg/dL — ABNORMAL HIGH (ref 70–99)
Glucose-Capillary: 257 mg/dL — ABNORMAL HIGH (ref 70–99)
Glucose-Capillary: 261 mg/dL — ABNORMAL HIGH (ref 70–99)
Glucose-Capillary: 265 mg/dL — ABNORMAL HIGH (ref 70–99)
Glucose-Capillary: 269 mg/dL — ABNORMAL HIGH (ref 70–99)
Glucose-Capillary: 283 mg/dL — ABNORMAL HIGH (ref 70–99)
Glucose-Capillary: 299 mg/dL — ABNORMAL HIGH (ref 70–99)
Glucose-Capillary: 310 mg/dL — ABNORMAL HIGH (ref 70–99)
Glucose-Capillary: 320 mg/dL — ABNORMAL HIGH (ref 70–99)
Glucose-Capillary: 361 mg/dL — ABNORMAL HIGH (ref 70–99)
Glucose-Capillary: 362 mg/dL — ABNORMAL HIGH (ref 70–99)
Glucose-Capillary: 363 mg/dL — ABNORMAL HIGH (ref 70–99)
Glucose-Capillary: 423 mg/dL — ABNORMAL HIGH (ref 70–99)
Glucose-Capillary: 448 mg/dL — ABNORMAL HIGH (ref 70–99)
Glucose-Capillary: 68 mg/dL — ABNORMAL LOW (ref 70–99)
Glucose-Capillary: 68 mg/dL — ABNORMAL LOW (ref 70–99)

## 2010-11-04 LAB — POCT CARDIAC MARKERS
CKMB, poc: 1.6 ng/mL (ref 1.0–8.0)
Myoglobin, poc: 74 ng/mL (ref 12–200)
Troponin i, poc: <0.05 ng/mL (ref 0.00–0.09)

## 2010-11-04 LAB — BASIC METABOLIC PANEL
BUN: 11 mg/dL (ref 6–23)
BUN: 11 mg/dL (ref 6–23)
BUN: 5 mg/dL — ABNORMAL LOW (ref 6–23)
BUN: 6 mg/dL (ref 6–23)
BUN: 6 mg/dL (ref 6–23)
CO2: 26 mEq/L (ref 19–32)
CO2: 27 mEq/L (ref 19–32)
CO2: 28 mEq/L (ref 19–32)
CO2: 29 mEq/L (ref 19–32)
CO2: 31 mEq/L (ref 19–32)
Calcium: 8.7 mg/dL (ref 8.4–10.5)
Calcium: 8.9 mg/dL (ref 8.4–10.5)
Calcium: 9.1 mg/dL (ref 8.4–10.5)
Calcium: 9.2 mg/dL (ref 8.4–10.5)
Calcium: 9.6 mg/dL (ref 8.4–10.5)
Chloride: 100 mEq/L (ref 96–112)
Chloride: 100 mEq/L (ref 96–112)
Chloride: 103 mEq/L (ref 96–112)
Chloride: 90 mEq/L — ABNORMAL LOW (ref 96–112)
Chloride: 98 mEq/L (ref 96–112)
Creatinine, Ser: 0.48 mg/dL (ref 0.4–1.2)
Creatinine, Ser: 0.49 mg/dL (ref 0.4–1.2)
Creatinine, Ser: 0.56 mg/dL (ref 0.4–1.2)
Creatinine, Ser: 0.58 mg/dL (ref 0.4–1.2)
Creatinine, Ser: 0.59 mg/dL (ref 0.4–1.2)
GFR calc Af Amer: >60 mL/min (ref >60)
GFR calc Af Amer: >60 mL/min (ref >60)
GFR calc Af Amer: >60 mL/min (ref >60)
GFR calc Af Amer: >60 mL/min (ref >60)
GFR calc Af Amer: >60 mL/min (ref >60)
GFR calc non Af Amer: >60 mL/min (ref >60)
GFR calc non Af Amer: >60 mL/min (ref >60)
GFR calc non Af Amer: >60 mL/min (ref >60)
GFR calc non Af Amer: >60 mL/min (ref >60)
GFR calc non Af Amer: >60 mL/min (ref >60)
Glucose, Bld: 126 mg/dL — ABNORMAL HIGH (ref 70–99)
Glucose, Bld: 174 mg/dL — ABNORMAL HIGH (ref 70–99)
Glucose, Bld: 276 mg/dL — ABNORMAL HIGH (ref 70–99)
Glucose, Bld: 329 mg/dL — ABNORMAL HIGH (ref 70–99)
Glucose, Bld: 422 mg/dL — ABNORMAL HIGH (ref 70–99)
Potassium: 2.8 mEq/L — ABNORMAL LOW (ref 3.5–5.1)
Potassium: 3.1 mEq/L — ABNORMAL LOW (ref 3.5–5.1)
Potassium: 3.1 mEq/L — ABNORMAL LOW (ref 3.5–5.1)
Potassium: 3.5 mEq/L (ref 3.5–5.1)
Potassium: 3.9 mEq/L (ref 3.5–5.1)
Sodium: 133 mEq/L — ABNORMAL LOW (ref 135–145)
Sodium: 134 mEq/L — ABNORMAL LOW (ref 135–145)
Sodium: 135 mEq/L (ref 135–145)
Sodium: 136 mEq/L (ref 135–145)
Sodium: 138 mEq/L (ref 135–145)

## 2010-11-04 LAB — CROSSMATCH
ABO/RH(D): B POS
Antibody Screen: NEGATIVE

## 2010-11-04 LAB — CARDIAC PANEL(CRET KIN+CKTOT+MB+TROPI)
CK, MB: 2.1 ng/mL (ref 0.3–4.0)
CK, MB: 2.2 ng/mL (ref 0.3–4.0)
Relative Index: INVALID (ref 0.0–2.5)
Relative Index: INVALID (ref 0.0–2.5)
Total CK: 36 U/L (ref 7–177)
Total CK: 38 U/L (ref 7–177)
Troponin I: 0.03 ng/mL (ref 0.00–0.06)
Troponin I: 0.04 ng/mL (ref 0.00–0.06)

## 2010-11-04 LAB — URINALYSIS, ROUTINE W REFLEX MICROSCOPIC
Bilirubin Urine: NEGATIVE
Glucose, UA: >1000 mg/dL — AB
Ketones, ur: 15 mg/dL — AB
Nitrite: NEGATIVE
Protein, ur: 100 mg/dL — AB
Specific Gravity, Urine: 1.035 — ABNORMAL HIGH (ref 1.005–1.030)
Urobilinogen, UA: 1 mg/dL (ref 0.0–1.0)
pH: 6 (ref 5.0–8.0)

## 2010-11-04 LAB — HEMOGLOBIN AND HEMATOCRIT, BLOOD
HCT: 35.9 % — ABNORMAL LOW (ref 36.0–46.0)
HCT: 38.4 % (ref 36.0–46.0)
Hemoglobin: 12.4 g/dL (ref 12.0–15.0)
Hemoglobin: 13.4 g/dL (ref 12.0–15.0)

## 2010-11-04 LAB — URINE MICROSCOPIC-ADD ON

## 2010-11-04 LAB — COMPREHENSIVE METABOLIC PANEL
ALT: 32 U/L (ref 0–35)
AST: 36 U/L (ref 0–37)
Albumin: 2.7 g/dL — ABNORMAL LOW (ref 3.5–5.2)
Alkaline Phosphatase: 196 U/L — ABNORMAL HIGH (ref 39–117)
BUN: 4 mg/dL — ABNORMAL LOW (ref 6–23)
CO2: 27 mEq/L (ref 19–32)
Calcium: 8.6 mg/dL (ref 8.4–10.5)
Chloride: 102 mEq/L (ref 96–112)
Creatinine, Ser: 0.44 mg/dL (ref 0.4–1.2)
GFR calc Af Amer: >60 mL/min (ref >60)
GFR calc non Af Amer: >60 mL/min (ref >60)
Glucose, Bld: 253 mg/dL — ABNORMAL HIGH (ref 70–99)
Potassium: 4.2 mEq/L (ref 3.5–5.1)
Sodium: 134 mEq/L — ABNORMAL LOW (ref 135–145)
Total Bilirubin: 0.8 mg/dL (ref 0.3–1.2)
Total Protein: 5.9 g/dL — ABNORMAL LOW (ref 6.0–8.3)

## 2010-11-04 LAB — FERRITIN: Ferritin: 122 ng/mL (ref 10–291)

## 2010-11-04 LAB — CBC
HCT: 26.8 % — ABNORMAL LOW (ref 36.0–46.0)
HCT: 39.1 % (ref 36.0–46.0)
Hemoglobin: 13.4 g/dL (ref 12.0–15.0)
Hemoglobin: 8.8 g/dL — ABNORMAL LOW (ref 12.0–15.0)
MCHC: 33 g/dL (ref 30.0–36.0)
MCHC: 34.4 g/dL (ref 30.0–36.0)
MCV: 87.7 fL (ref 78.0–100.0)
MCV: 88.5 fL (ref 78.0–100.0)
Platelets: 144 10*3/uL — ABNORMAL LOW (ref 150–400)
Platelets: 357 10*3/uL (ref 150–400)
RBC: 3.05 MIL/uL — ABNORMAL LOW (ref 3.87–5.11)
RBC: 4.41 MIL/uL (ref 3.87–5.11)
RDW: 13.1 % (ref 11.5–15.5)
RDW: 18.1 % — ABNORMAL HIGH (ref 11.5–15.5)
WBC: 10.9 10*3/uL — ABNORMAL HIGH (ref 4.0–10.5)
WBC: 5 10*3/uL (ref 4.0–10.5)

## 2010-11-04 LAB — LIPID PANEL
Cholesterol: 132 mg/dL (ref 0–200)
HDL: 36 mg/dL — ABNORMAL LOW (ref >39)
LDL Cholesterol: 82 mg/dL (ref 0–99)
Total CHOL/HDL Ratio: 3.7 RATIO
Triglycerides: 72 mg/dL (ref <150)
VLDL: 14 mg/dL (ref 0–40)

## 2010-11-04 LAB — POCT I-STAT, CHEM 8
BUN: 5 mg/dL — ABNORMAL LOW (ref 6–23)
Calcium, Ion: 1.11 mmol/L — ABNORMAL LOW (ref 1.12–1.32)
Chloride: 91 mEq/L — ABNORMAL LOW (ref 96–112)
Creatinine, Ser: 0.7 mg/dL (ref 0.4–1.2)
Glucose, Bld: 445 mg/dL — ABNORMAL HIGH (ref 70–99)
HCT: 47 % — ABNORMAL HIGH (ref 36.0–46.0)
Hemoglobin: 16 g/dL — ABNORMAL HIGH (ref 12.0–15.0)
Potassium: 3 mEq/L — ABNORMAL LOW (ref 3.5–5.1)
Sodium: 133 mEq/L — ABNORMAL LOW (ref 135–145)
TCO2: 34 mmol/L (ref 0–100)

## 2010-11-04 LAB — URINE CULTURE: Colony Count: >=100000

## 2010-11-04 LAB — ABO/RH: ABO/RH(D): B POS

## 2010-11-04 LAB — FOLATE: Folate: 16.1 ng/mL

## 2010-11-04 LAB — TSH: TSH: 0.801 u[IU]/mL (ref 0.350–4.500)

## 2010-11-04 LAB — CULTURE, BLOOD (ROUTINE X 2)
Culture: NO GROWTH
Culture: NO GROWTH

## 2010-11-04 LAB — HEMOCCULT GUIAC POC 1CARD (OFFICE)
Fecal Occult Bld: NEGATIVE
Fecal Occult Bld: NEGATIVE

## 2010-11-04 LAB — MAGNESIUM
Magnesium: 1.5 mg/dL (ref 1.5–2.5)
Magnesium: 1.5 mg/dL (ref 1.5–2.5)
Magnesium: 1.8 mg/dL (ref 1.5–2.5)

## 2010-11-04 LAB — RETICULOCYTES
RBC.: 4.49 MIL/uL (ref 3.87–5.11)
Retic Count, Absolute: 76.3 10*3/uL (ref 19.0–186.0)
Retic Ct Pct: 1.7 % (ref 0.4–3.1)

## 2010-11-04 LAB — IRON AND TIBC
Iron: 59 ug/dL (ref 42–135)
Saturation Ratios: 23 % (ref 20–55)
TIBC: 255 ug/dL (ref 250–470)
UIBC: 196 ug/dL

## 2010-11-04 LAB — HEMOGLOBIN A1C
Hgb A1c MFr Bld: 17.6 % — ABNORMAL HIGH (ref 4.6–6.1)
Mean Plasma Glucose: 458 mg/dL

## 2010-11-04 LAB — VITAMIN B12: Vitamin B-12: 930 pg/mL — ABNORMAL HIGH (ref 211–911)

## 2010-11-30 ENCOUNTER — Emergency Department (HOSPITAL_COMMUNITY): Payer: BC Managed Care – PPO

## 2010-11-30 ENCOUNTER — Inpatient Hospital Stay (HOSPITAL_COMMUNITY)
Admission: EM | Admit: 2010-11-30 | Discharge: 2010-12-04 | DRG: 127 | Disposition: A | Discharge status: Home Health | Payer: BC Managed Care – PPO | Attending: Internal Medicine | Admitting: Internal Medicine

## 2010-11-30 DIAGNOSIS — I509 Heart failure, unspecified: Secondary | ICD-10-CM | POA: Diagnosis present

## 2010-11-30 DIAGNOSIS — K5900 Constipation, unspecified: Secondary | ICD-10-CM | POA: Diagnosis not present

## 2010-11-30 DIAGNOSIS — R0602 Shortness of breath: Secondary | ICD-10-CM | POA: Diagnosis present

## 2010-11-30 DIAGNOSIS — F411 Generalized anxiety disorder: Secondary | ICD-10-CM | POA: Diagnosis present

## 2010-11-30 DIAGNOSIS — I10 Essential (primary) hypertension: Secondary | ICD-10-CM | POA: Diagnosis present

## 2010-11-30 DIAGNOSIS — E876 Hypokalemia: Secondary | ICD-10-CM | POA: Diagnosis present

## 2010-11-30 DIAGNOSIS — I5031 Acute diastolic (congestive) heart failure: Principal | ICD-10-CM | POA: Diagnosis present

## 2010-11-30 DIAGNOSIS — F17200 Nicotine dependence, unspecified, uncomplicated: Secondary | ICD-10-CM | POA: Diagnosis present

## 2010-11-30 DIAGNOSIS — Z794 Long term (current) use of insulin: Secondary | ICD-10-CM

## 2010-11-30 DIAGNOSIS — E1169 Type 2 diabetes mellitus with other specified complication: Secondary | ICD-10-CM | POA: Diagnosis present

## 2010-11-30 LAB — URINE MICROSCOPIC-ADD ON

## 2010-11-30 LAB — COMPREHENSIVE METABOLIC PANEL
ALT: 26 U/L (ref 0–35)
AST: 31 U/L (ref 0–37)
Albumin: 3.1 g/dL — ABNORMAL LOW (ref 3.5–5.2)
Alkaline Phosphatase: 250 U/L — ABNORMAL HIGH (ref 39–117)
BUN: 9 mg/dL (ref 6–23)
CO2: 29 mEq/L (ref 19–32)
Calcium: 9.6 mg/dL (ref 8.4–10.5)
Chloride: 95 mEq/L — ABNORMAL LOW (ref 96–112)
Creatinine, Ser: 0.66 mg/dL (ref 0.4–1.2)
GFR calc Af Amer: >60 mL/min (ref >60)
GFR calc non Af Amer: >60 mL/min (ref >60)
Glucose, Bld: 171 mg/dL — ABNORMAL HIGH (ref 70–99)
Potassium: 3.1 mEq/L — ABNORMAL LOW (ref 3.5–5.1)
Sodium: 135 mEq/L (ref 135–145)
Total Bilirubin: 0.3 mg/dL (ref 0.3–1.2)
Total Protein: 7.4 g/dL (ref 6.0–8.3)

## 2010-11-30 LAB — DIFFERENTIAL
Basophils Absolute: 0 10*3/uL (ref 0.0–0.1)
Basophils Relative: 0 % (ref 0–1)
Eosinophils Absolute: 0.2 10*3/uL (ref 0.0–0.7)
Eosinophils Relative: 2 % (ref 0–5)
Lymphocytes Relative: 41 % (ref 12–46)
Lymphs Abs: 4.3 10*3/uL — ABNORMAL HIGH (ref 0.7–4.0)
Monocytes Absolute: 0.6 10*3/uL (ref 0.1–1.0)
Monocytes Relative: 6 % (ref 3–12)
Neutro Abs: 5.3 10*3/uL (ref 1.7–7.7)
Neutrophils Relative %: 51 % (ref 43–77)

## 2010-11-30 LAB — CBC
HCT: 35.9 % — ABNORMAL LOW (ref 36.0–46.0)
Hemoglobin: 12.2 g/dL (ref 12.0–15.0)
MCH: 29 pg (ref 26.0–34.0)
MCHC: 34 g/dL (ref 30.0–36.0)
MCV: 85.5 fL (ref 78.0–100.0)
Platelets: 397 10*3/uL (ref 150–400)
RBC: 4.2 MIL/uL (ref 3.87–5.11)
RDW: 12.8 % (ref 11.5–15.5)
WBC: 10.4 10*3/uL (ref 4.0–10.5)

## 2010-11-30 LAB — URINALYSIS, ROUTINE W REFLEX MICROSCOPIC
Bilirubin Urine: NEGATIVE
Glucose, UA: NEGATIVE mg/dL
Ketones, ur: NEGATIVE mg/dL
Leukocytes, UA: NEGATIVE
Nitrite: NEGATIVE
Protein, ur: 100 mg/dL — AB
Specific Gravity, Urine: 1.007 (ref 1.005–1.030)
Urobilinogen, UA: 0.2 mg/dL (ref 0.0–1.0)
pH: 7 (ref 5.0–8.0)

## 2010-11-30 LAB — LIPASE, BLOOD: Lipase: 15 U/L (ref 11–59)

## 2010-11-30 LAB — GLUCOSE, CAPILLARY: Glucose-Capillary: 180 mg/dL — ABNORMAL HIGH (ref 70–99)

## 2010-12-01 ENCOUNTER — Inpatient Hospital Stay (HOSPITAL_COMMUNITY): Payer: BC Managed Care – PPO

## 2010-12-01 LAB — CARDIAC PANEL(CRET KIN+CKTOT+MB+TROPI)
CK, MB: 2.3 ng/mL (ref 0.3–4.0)
Relative Index: INVALID (ref 0.0–2.5)
Total CK: 47 U/L (ref 7–177)
Troponin I: <0.3 ng/mL (ref <0.30)

## 2010-12-01 LAB — LIPID PANEL
Cholesterol: 184 mg/dL (ref 0–200)
HDL: 88 mg/dL (ref >39)
LDL Cholesterol: 79 mg/dL (ref 0–99)
Total CHOL/HDL Ratio: 2.1 RATIO
Triglycerides: 86 mg/dL (ref <150)
VLDL: 17 mg/dL (ref 0–40)

## 2010-12-01 LAB — CBC
HCT: 29.8 % — ABNORMAL LOW (ref 36.0–46.0)
Hemoglobin: 10.1 g/dL — ABNORMAL LOW (ref 12.0–15.0)
MCH: 29.4 pg (ref 26.0–34.0)
MCHC: 33.9 g/dL (ref 30.0–36.0)
MCV: 86.6 fL (ref 78.0–100.0)
Platelets: 332 10*3/uL (ref 150–400)
RBC: 3.44 MIL/uL — ABNORMAL LOW (ref 3.87–5.11)
RDW: 13 % (ref 11.5–15.5)
WBC: 7.9 10*3/uL (ref 4.0–10.5)

## 2010-12-01 LAB — GLUCOSE, CAPILLARY
Glucose-Capillary: 247 mg/dL — ABNORMAL HIGH (ref 70–99)
Glucose-Capillary: 117 mg/dL — ABNORMAL HIGH (ref 70–99)
Glucose-Capillary: 173 mg/dL — ABNORMAL HIGH (ref 70–99)
Glucose-Capillary: 384 mg/dL — ABNORMAL HIGH (ref 70–99)
Glucose-Capillary: 110 mg/dL — ABNORMAL HIGH (ref 70–99)
Glucose-Capillary: 38 mg/dL — CL (ref 70–99)
Glucose-Capillary: 55 mg/dL — ABNORMAL LOW (ref 70–99)

## 2010-12-01 LAB — HEPATIC FUNCTION PANEL
ALT: 18 U/L (ref 0–35)
AST: 20 U/L (ref 0–37)
Albumin: 2.4 g/dL — ABNORMAL LOW (ref 3.5–5.2)
Alkaline Phosphatase: 190 U/L — ABNORMAL HIGH (ref 39–117)
Bilirubin, Direct: 0.1 mg/dL (ref 0.0–0.3)
Indirect Bilirubin: 0.2 mg/dL — ABNORMAL LOW (ref 0.3–0.9)
Total Bilirubin: 0.3 mg/dL (ref 0.3–1.2)
Total Protein: 5.7 g/dL — ABNORMAL LOW (ref 6.0–8.3)

## 2010-12-01 LAB — POCT CARDIAC MARKERS
CKMB, poc: 1.9 ng/mL (ref 1.0–8.0)
Myoglobin, poc: 75.8 ng/mL (ref 12–200)
Troponin i, poc: <0.05 ng/mL (ref 0.00–0.09)

## 2010-12-01 LAB — PROTIME-INR
INR: 0.99 (ref 0.00–1.49)
Prothrombin Time: 13.3 seconds (ref 11.6–15.2)

## 2010-12-01 LAB — MAGNESIUM: Magnesium: 1.7 mg/dL (ref 1.5–2.5)

## 2010-12-01 LAB — DIFFERENTIAL
Basophils Absolute: 0 10*3/uL (ref 0.0–0.1)
Basophils Relative: 0 % (ref 0–1)
Eosinophils Absolute: 0.2 10*3/uL (ref 0.0–0.7)
Eosinophils Relative: 2 % (ref 0–5)
Lymphocytes Relative: 30 % (ref 12–46)
Lymphs Abs: 2.3 10*3/uL (ref 0.7–4.0)
Monocytes Absolute: 0.7 10*3/uL (ref 0.1–1.0)
Monocytes Relative: 9 % (ref 3–12)
Neutro Abs: 4.7 10*3/uL (ref 1.7–7.7)
Neutrophils Relative %: 59 % (ref 43–77)

## 2010-12-01 LAB — HEMOGLOBIN A1C
Hgb A1c MFr Bld: 10 % — ABNORMAL HIGH (ref <5.7)
Mean Plasma Glucose: 240 mg/dL — ABNORMAL HIGH (ref <117)

## 2010-12-01 LAB — GAMMA GT: GGT: 301 U/L — ABNORMAL HIGH (ref 7–51)

## 2010-12-01 LAB — TSH: TSH: 1.97 u[IU]/mL (ref 0.350–4.500)

## 2010-12-01 LAB — D-DIMER, QUANTITATIVE (NOT AT ARMC): D-Dimer, Quant: 1.08 ug/mL-FEU — ABNORMAL HIGH (ref 0.00–0.48)

## 2010-12-01 LAB — PHOSPHORUS: Phosphorus: 3.6 mg/dL (ref 2.3–4.6)

## 2010-12-01 LAB — TROPONIN I: Troponin I: <0.3 ng/mL (ref <0.30)

## 2010-12-01 LAB — APTT: aPTT: 31 seconds (ref 24–37)

## 2010-12-01 LAB — PRO B NATRIURETIC PEPTIDE
Pro B Natriuretic peptide (BNP): 3855 pg/mL — ABNORMAL HIGH (ref 0–125)
Pro B Natriuretic peptide (BNP): 4141 pg/mL — ABNORMAL HIGH (ref 0–125)

## 2010-12-01 LAB — MRSA PCR SCREENING: MRSA by PCR: NEGATIVE

## 2010-12-02 LAB — GLUCOSE, CAPILLARY
Glucose-Capillary: 100 mg/dL — ABNORMAL HIGH (ref 70–99)
Glucose-Capillary: 108 mg/dL — ABNORMAL HIGH (ref 70–99)
Glucose-Capillary: 127 mg/dL — ABNORMAL HIGH (ref 70–99)
Glucose-Capillary: 141 mg/dL — ABNORMAL HIGH (ref 70–99)
Glucose-Capillary: 51 mg/dL — ABNORMAL LOW (ref 70–99)
Glucose-Capillary: 72 mg/dL (ref 70–99)
Glucose-Capillary: 79 mg/dL (ref 70–99)
Glucose-Capillary: 88 mg/dL (ref 70–99)
Glucose-Capillary: 96 mg/dL (ref 70–99)

## 2010-12-02 LAB — BASIC METABOLIC PANEL
BUN: 20 mg/dL (ref 6–23)
CO2: 32 mEq/L (ref 19–32)
Calcium: 9.8 mg/dL (ref 8.4–10.5)
Chloride: 95 mEq/L — ABNORMAL LOW (ref 96–112)
Creatinine, Ser: 0.78 mg/dL (ref 0.4–1.2)
GFR calc Af Amer: >60 mL/min (ref >60)
GFR calc non Af Amer: >60 mL/min (ref >60)
Glucose, Bld: 138 mg/dL — ABNORMAL HIGH (ref 70–99)
Potassium: 3.7 mEq/L (ref 3.5–5.1)
Sodium: 135 mEq/L (ref 135–145)

## 2010-12-02 LAB — TSH: TSH: 2.088 u[IU]/mL (ref 0.350–4.500)

## 2010-12-02 LAB — PRO B NATRIURETIC PEPTIDE: Pro B Natriuretic peptide (BNP): 2831 pg/mL — ABNORMAL HIGH (ref 0–125)

## 2010-12-03 LAB — BASIC METABOLIC PANEL
BUN: 18 mg/dL (ref 6–23)
CO2: 33 mEq/L — ABNORMAL HIGH (ref 19–32)
Calcium: 9.5 mg/dL (ref 8.4–10.5)
Chloride: 96 mEq/L (ref 96–112)
Creatinine, Ser: 0.67 mg/dL (ref 0.4–1.2)
GFR calc Af Amer: >60 mL/min (ref >60)
GFR calc non Af Amer: >60 mL/min (ref >60)
Glucose, Bld: 101 mg/dL — ABNORMAL HIGH (ref 70–99)
Potassium: 3.4 mEq/L — ABNORMAL LOW (ref 3.5–5.1)
Sodium: 136 mEq/L (ref 135–145)

## 2010-12-03 LAB — PRO B NATRIURETIC PEPTIDE: Pro B Natriuretic peptide (BNP): 2816 pg/mL — ABNORMAL HIGH (ref 0–125)

## 2010-12-03 LAB — GLUCOSE, CAPILLARY
Glucose-Capillary: 103 mg/dL — ABNORMAL HIGH (ref 70–99)
Glucose-Capillary: 181 mg/dL — ABNORMAL HIGH (ref 70–99)
Glucose-Capillary: 100 mg/dL — ABNORMAL HIGH (ref 70–99)
Glucose-Capillary: 89 mg/dL (ref 70–99)
Glucose-Capillary: 234 mg/dL — ABNORMAL HIGH (ref 70–99)
Glucose-Capillary: 248 mg/dL — ABNORMAL HIGH (ref 70–99)
Glucose-Capillary: 222 mg/dL — ABNORMAL HIGH (ref 70–99)
Glucose-Capillary: 245 mg/dL — ABNORMAL HIGH (ref 70–99)

## 2010-12-04 LAB — BASIC METABOLIC PANEL
BUN: 22 mg/dL (ref 6–23)
CO2: 31 mEq/L (ref 19–32)
Calcium: 9.8 mg/dL (ref 8.4–10.5)
Chloride: 95 mEq/L — ABNORMAL LOW (ref 96–112)
Creatinine, Ser: 0.74 mg/dL (ref 0.4–1.2)
GFR calc Af Amer: >60 mL/min (ref >60)
GFR calc non Af Amer: >60 mL/min (ref >60)
Glucose, Bld: 94 mg/dL (ref 70–99)
Potassium: 4.1 mEq/L (ref 3.5–5.1)
Sodium: 133 mEq/L — ABNORMAL LOW (ref 135–145)

## 2010-12-04 LAB — GLUCOSE, CAPILLARY
Glucose-Capillary: 109 mg/dL — ABNORMAL HIGH (ref 70–99)
Glucose-Capillary: 97 mg/dL (ref 70–99)

## 2010-12-04 LAB — PRO B NATRIURETIC PEPTIDE: Pro B Natriuretic peptide (BNP): 2580 pg/mL — ABNORMAL HIGH (ref 0–125)

## 2010-12-04 NOTE — Consult Note (Signed)
NAMEBELLAH, Destiny Day            ACCOUNT NO.:  [id number]  MEDICAL RECORD NO.:  [id number]           PATIENT TYPE:  I  LOCATION:  [id number]                         FACILITY[facility]  [facility]  PHYSICIAN:  Jettie Booze, MDDATE OF BIRTH:  04-05-1951  DATE OF CONSULTATION:  12/01/2010 DATE OF DISCHARGE:                                CONSULTATION   PRIMARY CARE PHYSICIAN:  Wenda Low, MD.  REASON FOR CONSULTATION:  Shortness of breath.  HISTORY OF PRESENT ILLNESS:  The patient is a 54-year with hypertension who has been short of breath for the past few days.  She stopped taking her blood pressure medicines several days ago thinking that they were making her symptoms worse.  She continued to be short of breath and had trouble lying flat.  She finally came to the emergency room and was found to have a blood pressure in the [id number] systolic.  She also had an elevated BNP and evidence of fluid overload on x-ray.  She was admitted and had her blood pressure lowered gradually.  She is also diuresed. She is feeling somewhat better.  She does still have abdominal fullness and discomfort.  Her abdominal x-ray showed evidence of constipation.  PAST MEDICAL HISTORY: 1. Hypertension. 2. Diabetes. 3. Hyperlipidemia.  HOME MEDICATIONS: 1. Norvasc 10 mg daily. 2. Losartan/HCTZ 100/25. 3. Metoprolol 100 mg b.i.d. 4. Simvastatin 40 mg daily. 5. Potassium 20 mEq daily. 6. Metformin 500 mg b.i.d. 7. Aspirin 81 mg daily. 8. Humulin 70/30. 9. Hydralazine 25 mg b.i.d.  PAST SURGICAL HISTORY:  Prior cardiac cath with abdominal aortogram showed no significant CAD, no significant renal artery stenosis.  ALLERGIES:  She is intolerant of lisinopril, cough.  FAMILY HISTORY:  Mother died of cancer. She has a sister with diabetes.  SOCIAL HISTORY:  She smokes intermittently.  She is trying to quit.  No alcohol or drugs.  She is married.  REVIEW OF SYSTEMS:  Significant for the abdominal discomfort,  shortness of breath.  No significant swelling.  No bleeding problems.  No focal weakness.  No rash.  All other systems negative.  PHYSICAL EXAMINATION:  VITAL SIGNS:  Blood pressure 167/84, heart rate 82. GENERAL:  She is awake and alert, no apparent distress. HEAD:  Normocephalic, atraumatic. EYES:  Extraocular movements intact. NECK:  No JVD. CARDIOVASCULAR:  Regular rate and rhythm, S1, S2. LUNGS:  Bibasilar crackles. ABDOMEN:  Soft, mildly tender.  No rebound or guarding. EXTREMITIES:  Showed no edema. NEURO:  No focal motor or sensory deficits. SKIN:  No rash. BACK:  No kyphosis. PSYCH:  Normal mood and affect.  LABORATORY DATA:  Lab work shows a creatinine of 0.6, hemoglobin 10.1, potassium 3.1.  ECG shows normal sinus rhythm with ST-segment changes in the lateral leads which are somewhat dynamic. They could be consistent with either ischemia or LV strain, troponin negative.  Echocardiogram showed LVH and normal LV function.  Chest x-ray findings as noted above.  ASSESSMENT/PLAN: 1. 59-year or shortness of breath likely secondary to acute diastolic     heart failure.  Continue Lasix.  She needs aggressive blood     pressure control, could up titrate  hydralazine.  Otherwise I agree     with the calcium channel blocker, ARB, and beta-blocker. 2. Constipation is probably was bothering her the most.  Currently     given that she is on Norvasc and she will need to be on something     chronic to prevent constipation. 3. Given the shortness of breath and evidence of failure, we would     consider repeating an outpatient stress test.  It is reassuring     that she had a negative cardiac cath back in 2007.  She also had a     full workup for renal artery stenosis at that time which was     negative. Will follow while she is in the hospital.     Jettie Booze, MD     JSV/MEDQ  D:  12/01/2010  T:  12/02/2010  Job:  [id number]  Electronically Signed by Larae Grooms  MD on 12/04/2010 12:52:07 PM

## 2010-12-09 NOTE — H&P (Signed)
Destiny Day, CLARIZIO            ACCOUNT NO.:  [id number]   MEDICAL RECORD NO.:  [id number]          PATIENT TYPE:  INP   LOCATION:                               FACILITY:  [facility]   PHYSICIAN:  Rise Patience, MDDATE OF BIRTH:  June 15, 1951   DATE OF ADMISSION:  11/29/2008  DATE OF DISCHARGE:                              HISTORY & PHYSICAL   PRIMARY CARE PHYSICIAN:  Unassigned.   CHIEF COMPLAINT:  Increased urination.   HISTORY OF PRESENT ILLNESS:  This 60 year old female with a history of  diabetes mellitus type 2 and hypertension, on no medications for around  six months, as the patient stated that she is finding it difficult to  take so many medications. She came to the ER because of increasing  urination and frequent urination for the last one month.  In the ER the  patient was found to have high blood pressure and high blood sugar and  presently is on IV insulin drip with an anion gap of 13.  The patient  has been admitted for further observation and management.   The patient states she has been having increased urination but did not  have any dysuria.  Denies any abdominal pain, nausea, vomiting,  diarrhea, dysuria or discharges.  Denies any fever or chills.  Denies  any chest pain, shortness of breath.  Has fatigue.  Denies any focal  deficits, headache or any blood vision.   PAST MEDICAL HISTORY:  1. Diabetes mellitus, type 2.  2. Hypertension.   PAST SURGICAL HISTORY:  1. Hysterectomy.  2. Cardiac catheterization, which was normal.   MEDICATIONS ON ADMISSION:  None.   ALLERGIES:  NO KNOWN DRUG ALLERGIES, but __________ causes her to have a  cough.   FAMILY HISTORY:  Nothing contributory.   SOCIAL HISTORY:  The patient smokes.  Strongly advised to quit smoking.  Denies any alcohol or drug abuse.   REVIEW OF SYSTEMS:  As stated in the history of present illness.  Nothing else significant.   PHYSICAL EXAMINATION:  The patient examined at bedside, not in  acute  distress.  VITAL SIGNS:  Blood pressure is 189/110.  When she came in it was  214/136, pulse 110, O2 sat 98%, respiration 20 per minute, temperature  98.2.  HEENT: Anicteric.  No pallor.  The tongue is appearing very dry  CHEST:  Bilateral air entry present.  No crepitation.  HEART:  S1 and S2 heard.  ABDOMEN:  Soft, nontender.  Bowel sounds heard.  NEUROLOGIC:  Alert, awake and oriented to person, place and person.  EXTREMITIES:  Both upper and lower extremities:  Intact.  Peripheral  pulses felt.  No edema.   LABORATORY DATA:  Electrocardiogram:  Normal sinus rhythm with mild  tachycardia, nonspecific ST-T changes.   BMET:  Sodium 135, potassium 2.8, initially was 133 and 3 respectively,  chloride 90, carbon dioxide 31, anion gap 14, BUN 6,  creatinine 0.5,  calcium 9.6.  Troponin-I less than 0.05.  A UA showing ketones, glucose  more than 1000, leukocytes moderate, bacteria many, WBCs too numerous to  count.   ASSESSMENT:  1. Diabetic ketoacidosis.  2. Axillary hypertension.  3. Dehydration.  4. Urinary tract infection.  5. Noncompliance.   PLAN:  Will admit the patient to he step-down unit.  Will follow DKA  protocol, IV insulin drip and Cipro for urinary tract infection.  Aggressively hydrate the patient intravenously.  Will send urine for  urine cultures, blood cultures and also check cardiac enzymes, as at  this time the patient is not having any chest pain.  The patient advised  to quit smoking and has to be compliant with medication.  The patient  already has a plan to follow with Dr. Katina Degree, her  endocrinologist.  Will also need to be set up with her primary care  physician for the patient, once she gets discharged.      Rise Patience, MD  Electronically Signed     ANK/MEDQ  D:  11/29/2008  T:  11/29/2008  Job:  [id number]

## 2010-12-09 NOTE — Discharge Summary (Signed)
[name], Day            ACCOUNT NO.:  [id number]   MEDICAL RECORD NO.:  [id number]          PATIENT TYPE:  INP   LOCATION:  [id number]                         FACILITY:  [facility]   PHYSICIAN:  Vernell Leep, MD     DATE OF BIRTH:  1950-10-13   DATE OF ADMISSION:  11/29/2008  DATE OF DISCHARGE:  12/04/2008                               DISCHARGE SUMMARY   PRIMARY MEDICAL DOCTOR:  The patient will see Dr. Berneta Sages as a new  patient.   DISCHARGE DIAGNOSES:  1. Accelerated hypertension.  2. Uncontrolled type 2 diabetes mellitus, insulin requiring.  3. Methicillin-sensitive Staphylococcus aureus urinary tract      infection.  4. Hypomagnesemia and hypokalemia.  5. Dehydration, resolved.   DISCHARGE MEDICATIONS:  1. NovoLog sliding scale insulin as per directions.  2. Olmesartan 40 mg p.o. daily.  3. Doxycycline 100 mg p.o. b.i.d. for 4 days.  4. Lantus 15 units subcutaneously daily.  5. NovoLog 5 units subcutaneously t.i.d. with meals.  6. Hydrochlorothiazide 25 mg p.o. daily.  7. Metoprolol 100 mg p.o. b.i.d.  8. Amlodipine 10 mg p.o. daily.  9. Potassium chloride 20 meq p.o. daily.   PROCEDURES:  None.   PERTINENT LABORATORY DATA:  Magnesium 1.5.  Basic metabolic panel today  with potassium 3.5, BUN 11, creatinine 0.59.  Fecal occult blood testing  negative.  Urine culture with Staphylococcus aureus that was sensitive  to gentamicin, levofloxacin, nitrofurantoin, oxacillin, rifampin,  Bactrim, vancomycin and tetracycline.  Was resistant to penicillin.   Anemia panel with absolute reticulocyte 76.3, iron 59, total iron-  binding capacity 255% saturation 23.  Vitamin B12 930, serum folate 16.1  and ferritin 122.  Blood cultures from May 6:  No growth to date.  Lipid  panel with HDL 36, triglycerides 72, cholesterol 132, LDL 82, VLDL 14.  Hemoglobin A1c of 17.6.  TSH 0.801.  Cardiac panel was cycled and  negative.  Urinalysis with many bacteria and too numerous to count  white  blood cells.  CBCs with hemoglobin 13.4, hematocrit 39.1, white blood  cell 10.9, platelets 357.   CONSULTATIONS:  None.   HOSPITAL COURSE AND PATIENT DISPOSITION:  Please refer to the history  and physical note for initial admission details.  In summary, Destiny Day  is a very pleasant 60 year old African American female patient with  history of type 2 diabetes and hypertension who has been noncompliant  with her medications for 6 months.  She now presented to the emergency  room with polyuria.  In the emergency room, she was found to have very  high blood pressure and high blood sugar.  She was thereby admitted to  the hospital for further evaluation and management.  Her bicarbonate on  admission was 31 and glucose of 445.   1. Accelerated hypertension.  The patient had a blood pressure of      214/136 mmHg initially.  She was admitted to the hospital.  She was      started on Benicar and p.r.n. labetalol.  However, over the next      few days, her blood pressures continued to be  high following which      medications were added and doses were adjusted.  Today, on the      above regimen, her blood pressures mostly range in the 170s/80s to      90s.  Will increase the dose of her hydrochlorothiazide and      amlodipine.  Some of this high blood pressure may be related to      white-coat hypertension and anxiety related to being in the      hospital.  In any event, the patient indicates she has been      hypertensive since the age of 6 or 12 when she had her first      child.  She is asymptomatic with no chest pain, dyspnea, headache,      visual symptoms.  She is ambulating in the halls without any      complaints.  She has been advised to follow-up with her new primary      medical doctor within the next 3-5 days so that she can have her      blood pressure check and necessary blood pressure medication      adjustment.  Also consider outpatient evaluation for secondary       causes of hypertension if not able to adequately control.  2. Uncontrolled type 2 diabetes mellitus.  The patient was briefly      placed on IV insulin drip which was soon changed to Lantus.  Again,      over the course of the next few days, the patient's insulins were      adjusted.  Currently on Lantus 10 units and NovoLog 4 units t.i.d.      Her sugars are in the mid 260s.  According to the diabetes      treatment program, they recommend Lantus 20 units daily and      recommend increasing the mealtime as needed.  Will increase Lantus      to 15 units subcutaneously daily and NovoLog mealtime to 5 units      subcutaneously t.i.d. with meals.  The patient has used insulin in      the past, and she has been educated regarding insulin self-      administration, CBG checks and monitoring for hypoglycemia.  Also,      an outpatient diabetes education consult has been requested.  The      patient was provided with nutrition consult.  3. Methicillin-sensitive Staphylococcus aureus.  The patient was      initially placed on ciprofloxacin which once the results were back      was changed to doxycycline of which she will complete a week's      course.  4. Hypomagnesemia and hypokalemia.  Will replete prior to discharge.  5. Dehydration, resolved.   I have been talking to the patient and her spouse and rest of the family  almost on a daily basis, updating them about her care.  All their  questions have been answered to their satisfaction.  The patient also  wanted a letter to take to her work place.  I have provided a letter  whereby the patient will need to see her primary MD in the next couple  of days and cleared prior to returning to work.  The patient's spouse  indicates that he has already called Dr. Danna Hefty office for an  appointment to be seen by the end of this week or early next week.   The patient  at this time is stable for discharge home.   Time taken in coordinating this discharge  is 45 minutes.      Vernell Leep, MD  Electronically Signed     AH/MEDQ  D:  12/04/2008  T:  12/04/2008  Job:  [id number]   cc:   Berneta Sages, M.D.  Fax: [phone]

## 2010-12-12 NOTE — Op Note (Signed)
NAME:  Destiny Day, Destiny Day                      ACCOUNT NO.:  [id number]   MEDICAL RECORD NO.:  [id number]                   PATIENT TYPE:  AMB   LOCATION:  DAY                                  FACILITY:  [facility]   PHYSICIAN:  Bernestine Amass, M.D.               DATE OF BIRTH:  Jul 03, 1951   DATE OF PROCEDURE:  07/31/2002  DATE OF DISCHARGE:                                 OPERATIVE REPORT   PREOPERATIVE DIAGNOSES:  1. Urethral diverticulum.  2. Chronic cystitis.   POSTOPERATIVE DIAGNOSES:  1. Urethral diverticulum.  2. Chronic cystitis.   PROCEDURE PERFORMED:  Excision of urethral diverticulum with urethral  closure.   SURGEON:  Bernestine Amass, M.D.   ASSISTANT:  Dutch Gray, M.D. and Sigmund I. Gaynelle Arabian, M.D.   ANESTHESIA:  General.   INDICATIONS:  The patient is Day 60 year old female.  She presented with some  nonspecific voiding complaints, vaginal irritation and discomfort.  She was  seen at one of the urgent care centers and thought to have acute cystitis.  There was also some concern about Day urethral mass.  When we saw her she had  Day tender suburethral mass.  Initial urine showed considerable pyuria but Day  culture showed mixed flora.  When she returned, her urinalysis continued to  show too numerous to count white blood cells.  Day catheterized urine was  performed which was relatively clear, indicating that the inflammation was  indeed within her urethra.  Palpably, she had Day tender 3-4 cm suburethral  mass consistent with Day probable urethral diverticulum that was inflamed.  We  could not rule out the possibility of periurethral gland inflammation.  Cystoscopy in our office revealed some purulent material in the urethra with  compression of this mass suggesting Day communication to the urethra.  Given  her abnormal urinalysis we also felt that there was likely Day communication  and likely to be Day urethral diverticulum.  The patient was put on extensive  course of Keflex  and presents now for exploration and excision of this.  In  the interim, her symptoms have resolved.   TECHNIQUE AND FINDINGS:  The patient was brought to the operating room where  she had successful induction of general anesthesia.  She was placed in  lithotomy position and prepped and draped in the usual manner.  The majority  of the suburethral mass had resolved at the time of examination under  anesthesia.  There was still some induration along the course of her mid  urethra but Day discrete mass was no longer present.  Cystoscopy did not  reveal an obvious opening but we felt that there was likely to be Day  communication.  We infiltrated the anterior vaginal mucosa with some  lidocaine.  An inverted U incision was then made from the distal urethra  back down towards the bladder neck and Day flap of vaginal tissue was raised.  Underlying tissue  overlying the mid urethra was markedly indurated and there  were essentially no normal tissue planes.  We were able to get Day second  layer of tissue dissected before getting right on top of the mid urethra.  There we were able to identify what appeared to be Day small and fibrotic sac  consistent with urethral diverticulum.  Cystoscopy was then again performed  and we thought we saw small ostium in the mid urethra just about the 7  o'clock position but we were unable to cannulate this.  After dissecting  around the urethra and determining the extent of the small fibrotic sac, the  sac itself was opened.  The appearance did appear to be consistent with Day  small and somewhat fibrotic and scarred urethral diverticular sac.  The  entire sac was removed and Day very small opening in the urethra was then  identified which presumably was the ostium.  Day couple of 3-0 Vicryl sutures  were used to close this.  The remaining tissue around the urethra itself  looked reasonably normal, though again there was some induration and some  thickening.  We were able to  bring several flaps of tissue over this area to  buttress the urethra and provide multiple layers of closure and tissue  interposition between the urethra and vaginal wall.  We then trimmed the  vaginal mucosa and were able to advance this flap up towards the most distal  aspect of the urethra to provide Day suture line well away from the previous  suture line.  The vaginal incision was closed with some 2-0 Vicryl suture.  Some standard vaginal packing was utilized.  Day urethral catheter was left  indwelling.  Blood loss was quite minimal and there were no obvious  complications or problems.                                                 Bernestine Amass, M.D.    DSG/MEDQ  D:  07/31/2002  T:  07/31/2002  Job:  [id number]

## 2010-12-12 NOTE — Cardiovascular Report (Signed)
NAME:  Destiny Day, AROSTEGUI            ACCOUNT NO.:  [id number]   MEDICAL RECORD NO.:  [id number]          PATIENT TYPE:  AMB   LOCATION:  SDS                          FACILITY:  [facility]   PHYSICIAN:  Quay Burow, M.D.   DATE OF BIRTH:  [birth year]   DATE OF PROCEDURE:  DATE OF DISCHARGE:  08/10/2005                              CARDIAC CATHETERIZATION   [name] is a 60 year old African American female, patient of Dr. Morrie Sheldon at [facility] who was referred for evaluation of  uncontrolled hypertension.  Renal Dopplers suggested moderate right renal  artery stenosis.  Persantine Cardiolite revealed mild lateral ischemia.  The  patient presents now for cardiac catheterization and abdominal aortography  plus/minus __________  angiography for evaluation of renal vascular  hypertension and positive cardiac stress test.   PROCEDURE DESCRIPTION:  The patient was brought to the second floor [facility] cardiac cath lab in a postabsorptive state.  She was premedicated with  p.o. valium.  Her right groin was prepped and shaved in usual sterile  fashion.  One percent Xylocaine was used for local anesthesia.  A 6 French  sheath was inserted into the right femoral artery using standard Seldinger  technique.  A 6 French right and left Judkins' catheter, as well as a [place]  pigtail catheter were used for selective coronary angiography, left  ventricular angiography and additional abdominal aortography.  Visipaque dye  was used for the entirety of the case.  Retrograde aortic, left ventricular  ample provided pressures were recorded.   HEMODYNAMICS:  1.  The aortic systolic pressure Q000111Q, diastolic pressure A999333.  2.  Left ventricular systolic pressure A999333 and diastolic pressure 9. There      did not appear however to be a pullback gradient on pullback.   SELECTIVE CORONARY ANGIOGRAPHY:  1.  Left main normal.  2.  LAD normal.  3.  Left circumflex normal.  4.  Right coronary  was dominant normal.   LEFT VENTRICULOGRAPHY:  RAO left ventriculogram was performed using 25 cc of  Visipaque dye at 12 cc per second.  The overall LV EF was estimated greater  than 60% without focal wall motion abnormalities.   DISTAL ABDOMINAL AORTOGRAPHY:  Distal abdominal aortogram was performed  using 20 cc of Visipaque dye at 20 cc per second.  The renal arteries were  mildly patent.  The renal abdominal aortogram and __________  prior changes.   IMPRESSION:  Ms. Savary has essential normal coronary arteries to normal LV  function, as well as renal arteries.  I believe her Cardiolite was false-  positive, probably related to breast attenuation and her hypertension is  essentially.  There is no renal artery stenosis noted.  The patient will  receive IV labetalol for blood pressure control prior to  sheath removal.  She will be discharged home later today as an outpatient.  She will see me in the office in 1-2 weeks in follow up.   Medical therapy will be recommended. She left the lab in stable condition.      Quay Burow, M.D.  Electronically Signed  JB/MEDQ  D:  08/10/2005  T:  08/10/2005  Job:  [id number]   cc:   [facility]   [facility]  [address]  [place]  [place] [address]   Chipper Herb, M.D.  Fax: [phone]

## 2010-12-18 NOTE — Discharge Summary (Signed)
Destiny Day, Destiny Day            ACCOUNT NO.:  [id number]  MEDICAL RECORD NO.:  [id number]           PATIENT TYPE:  I  LOCATION:  [id number]                         [facility]ACILITY:  [facility]  PHYSICIAN:  Wenda Low, MD      DATE OF BIRTH:  1950/09/20  DATE OF ADMISSION:  11/30/2010 DATE OF DISCHARGE:  12/04/2010                              DISCHARGE SUMMARY   DISCHARGE DIAGNOSES: 1. Acute congestive heart failure diastolic dysfunction. 2. Hypertensive crisis. 3. Diabetes. 4. Constipation. 5. Hypokalemia. 6. History of tobacco use. 7. Mild anxiety.  MEDICATION ON DISCHARGE: 1. Hydralazine 50 mg p.o. t.i.d. 2. Aspirin 81 mg daily. 3. Colace 100 mg b.i.d. p.r.n. 4. Losartan 100 mg every day. 5. Potassium 20 mEq twice a day. 6. Lasix 40 mg b.i.d. 7. Lopressor 100 mg b.i.d. 8. MiraLax 17 g daily p.r.n. 9. Norvasc 10 mg q.p.m. 10.Insulin NovoLog mix 70/30, 16 units in the evening, 20 units in the     morning. 11.Metformin 500 mg b.i.d. 12.Senokot p.r.n.  CONSULTATION:  Cardiology.  TESTS DONE.:  A 2-D echocardiogram, normal with EF of 60-65% with some LVH without any valvular disorder.  LABORATORY DATA:  Cardiac markers negative.  Chest x-ray showed CHF. Blood chemistries; potassium 4.1, creatinine of 0.74, BUN of 22.  Sodium 133, hemoglobin of 10.1, white count of 7.9, TSH of 2.0.  BNP initially at the time of admission was 1300, at time of discharge was 2500.  A1c was 10.0.  D-dimer was mildly elevated.  HOSPITAL COURSE:  This is a 60 year old female admitted with hypertensive crisis, blood pressure systolic over A999333, found to have acute CHF with shortness of breath and hypoxia: 1. Cardiac.  The patient was admitted to ICU step-down, was monitored,     cardiac marker was negative.  Her blood pressure controlled     initially with IV nitroglycerin drip and IV hydralazine.  She also     started on IV diuresing with Lasix and responded very well with IV     Lasix with improved  shortness of breath and BNP improved.     Cardiology was consulted, had 2-D echocardiogram showed EF of 60-     65% with LVH thought to be diastolic dysfunction in setting of     hypertensive crisis.  She had a normal cath in 2007.  The patient     was medically managed with diuresing, Lasix changed to p.o. 40 mg.     Blood pressure medication was adjusted.  Her blood pressure     remained in the 160s.  She will get outpatient followup and     clinically improved shortness of breath, no chest pain.  Her room     air oxygenation remained 95-97%. 2. Hypokalemia, due to diuresing which was replaced.  Potassium was     adjusted. 3. Constipation.  Because of multiple medication effects she required     some bowel regimen and laxative which good results.  X-ray of the     abdomen did not show any obstruction. 4. Type 2 diabetes.  She did have hypoglycemia in the hospital     initially.  Her  insulin regular dose was cut down.  Her blood sugar     at time of discharge was going over 200 which we will resume her     home dose of blood pressure medication as well as her metformin. 5. History of tobacco use.  The patient requires nitroglycerin.  The     patient did cut some Nicorette patch for tobacco cessation.  I     encouraged her to quit tobacco and counseling.  The patient was     ambulatory and oxygen saturation remained stable without any chest     pain or shortness of breath.  The patient will be discharged to     home with home health.  I will follow her up in 1 week and we will     do an outpatient cardiology evaluation if needed.  Discharge planning time taken over 30 minutes.     Wenda Low, MD     KH/MEDQ  D:  12/04/2010  T:  12/04/2010  Job:  [id number]  Electronically Signed by Wenda Low MD on 12/18/2010 08:57:57 AM

## 2010-12-25 NOTE — H&P (Signed)
Destiny Day, Destiny Day            ACCOUNT NO.:  [id number]  MEDICAL RECORD NO.:  [id number]           PATIENT TYPE:  E  LOCATION:  WLED                         FACILITY:  [facility]  PHYSICIAN:  Barbette Merino, M.D.      DATE OF BIRTH:  May 27, 1951  DATE OF ADMISSION:  11/30/2010 DATE OF DISCHARGE:                             HISTORY & PHYSICAL   DATE OF ADMISSION:  Dec 01, 2010  CHIEF COMPLAINT:  Short of breath.  HISTORY OF PRESENT ILLNESS:  This 60 year old female is followed in primary care by Dr. Lysle Rubens.  She presents to [facility] Emergency Room with complaints of shortness of breath.  This started about 3 days ago with a sensation of abdominal bloating.  She stopped taking her prescribed blood pressure medications because she felt they were making this abdominal bloating worse.  This progressed to shortness of breath and orthopnea which ultimately brought her to the hospital today.  This shortness of breath has been progressively worsening over the past 2 days.  With evaluation in the emergency room she was noted to have a hypertensive crisis systolic blood pressure in the 220 range.  Her chest x-ray suggests congestive heart failure and she has a markedly elevated pro BNP.  She has no prior cardiac history.  She has experienced no chest pain or palpitation.  She has had no nausea or vomiting.  She has some dyspnea with exertion.  She is referred for admission to [facility] and will be under the service of Dr. Lysle Rubens.  PAST MEDICAL HISTORY: 1. Accelerated hypertension. 2. Diabetes. 3. MRSA urinary tract infection. 4. Hypomagnesemia. 5. Hypokalemia. 6. Dehydration with hospitalization May, 2010. 7. Prior hysterectomy. 8. Prior cardiac catheterization, January 2007 reported as normal. 9. Hyperlipidemia.  MEDICATIONS:  List pending completion of med reconciliation.  ALLERGIES:  No known drug allergies.  FAMILY HISTORY:  Mother died of complications of cancer.   Father is living but in unknown health.  The patient has five siblings all in relatively good health.  One sister with diabetes.  Family propensity for diabetes and hypertension.  SOCIAL HISTORY:  The patient is married.  She continues to work but will retire later this year.  She smokes about one-quarter pack cigarettes daily.  No alcohol.  No illicit drugs.  REVIEW OF SYSTEMS:  EYES:  No cataracts or glaucoma.  No visual change. EARS: No hearing loss, discharge, pain.  NOSE:  No rhinitis or sinusitis.  MOUTH/THROAT:  No oral or dental pain.  No dysphagia. CARDIAC:  No history of coronary artery disease.  She does have a history of hypertension and hyperlipidemia.  No central chest pain or palpitation.  LUNGS:  Dyspnea with exertion and orthopnea. No cough or sputum.  The patient states that she does have a history of asthma. Progressive shortness of breath and a feeling of abdominal distention as above. ABDOMEN:  Again, the patient has described a feeling of abdominal distention over the past 3 days.  She believes she has gained weight and possibly 4 to 6 pounds.  URINARY/GENITAL:  No dysuria or other symptoms to suggest urinary tract infection.  EXTREMITIES: No peripheral  edema. Denies any calf pain.  MUSCULOSKELETAL:  Denies any falls or trauma.  No myalgias or arthralgias.  Neurologic: Denies any focal weakness.  No history of stroke or seizure.  Hematologic no abnormal bleeding or bruising.  PHYSICAL EXAM:  VITAL SIGNS: Temperature 98.5, pulse 80 to 107, respirations 19 to 22.  Present blood pressure 238/127.  Currently systolic blood pressure in the 150s, on a nitroglycerin drip.  O2 sats 96%. GENERAL APPEARANCE:  A well-developed, middle-aged female in no distress.  She is alert and oriented. HEENT: Head normocephalic. Eyes: Pupils equal and reactive.  Ears: Canals clear and hearing normal to conversational tone.  Nose: Nares patent without discharge noted.  Oral mucosa pink  and moist. NECK: No jugular venous distention, bruits, adenopathy, or thyromegaly. CARDIAC:  Rate and rhythm regular with a split S2 intermittently. Occasional irregular beat.  No murmur appreciated.  No peripheral edema. Negative Homans. LUNGS:  Crackles midlung to base bilaterally.  Otherwise, breath sounds are clear and there is no distress with stable O2 sats on low-flow nasal cannula oxygen. ABDOMEN:  Soft with positive bowel sounds in all four quadrants.  No pain, guarding, rebound tenderness.  No masses or bruits noted. URINARY/GENITAL:  No bladder pain or CVA tenderness. MUSCULOSKELETAL:  Range of motion is full in all four extremities. Strength 5/5 and equal x4. NEUROLOGIC:  Cranial nerves II-XII grossly intact.  No unilateral or focal defects. HEMATOLOGIC: No abnormal bleeding or bruising seen.  RADIOLOGY AND LABORATORY DATA:  A one-view chest x-ray notes interstitial edema related congestive heart failure. CBC with diff found WBC 10.4, hemoglobin 12.2, hematocrit 35.9, platelets 397.  Differential essentially unremarkable.  Comprehensive metabolic panel, sodium [id number], potassium low 3.1, chloride 95, CO2 29, BUN 9, and creatinine 0.66.  GFR greater than 60.  Glucose 171.  Alkaline phosphatase, high 250 with other liver components unremarkable.  Albumin low at 3.1.  Lipase 15. Urine microscopic 32 RBC, zero to 2 WBCs, rare bacteria.  Point-of-care cardiac markers CK-MB 1.9, troponin less than 0.05, myoglobin 75.8. Troponin less than 0.30.  B natriuretic peptide elevated at 3855.  IMPRESSION/PLAN: 1. Hypertensive emergency.  Will continue nitroglycerin drip as     started by emergency department physician.  I have requested 5 mcg     rate and target systolic blood pressure of 140.  Will place in step-     down unit to monitor her closely.  A 12-lead EKG q.6 h for three     results.  Also cardiac enzymes for three results every 6 hours.     Will check a 2-D echocardiogram in  a.m..  Will need to clarify her     home medications with completion of med reconciliation and     introduce these.  I have also placed her on hydralazine p.r.n.. 2. Congestive heart failure.  There is no prior history of this.  This     is confirmed by both chest x-ray and B natriuretic peptide. He may     well be that the failure is secondary to her hypertensive urgency.     I will place her on Lasix 40 mg IV q.12 h and again a 2-D     echocardiogram in a.m.Marland Kitchen  Cardiac consult per discretion of rounding     physician.  Will recheck a B natriuretic peptide in a.m. 3. Hypokalemia.  Will replace with 4 runs of 10 mEq potassium     chloride.  Follow a basic metabolic panel with morning labs.  Will     check a magnesium and phosphorus level and replace these if needed. 4. Diabetes.  Unsure of her home medications.  Apparently, she is on     insulin.  Will place her on a carb modified diet as tolerated.  CBG     is every 4 hours initially and cover with a moderate NovoLog     sliding scale. 5. Elevated alkaline phosphatase.  Etiology is unclear. Will obtain a     serum GGT and if elevated then a further GI workup may well be     needed.  I will also check a KUB of the abdomen.  Given the     patient's complaints of bloating sensation. 6. Tobacco smoker.  The patient declines nicotine patch but does agree     to tobacco cessation consult. 7. DVT prophylaxis.  We will use Lovenox 40 mg subcu daily. 8. Code status.  The patient is a full code.     Harless Litten, N.P.   ______________________________ Barbette Merino, M.D.    TC/MEDQ  D:  12/01/2010  T:  12/01/2010  Job:  [id number]  cc:   Wenda Low, MD Fax: [phone]  Electronically Signed by Harless Litten N.P. on 12/05/2010 06:58:18 PM Electronically Signed by Barbette Merino M.D. on 12/25/2010 11:44:03 AM

## 2011-05-16 ENCOUNTER — Emergency Department (HOSPITAL_COMMUNITY): Payer: BC Managed Care – PPO

## 2011-05-16 ENCOUNTER — Inpatient Hospital Stay (HOSPITAL_COMMUNITY)
Admission: EM | Admit: 2011-05-16 | Discharge: 2011-05-20 | DRG: 127 | Disposition: A | Discharge status: Home Health | Payer: BC Managed Care – PPO | Attending: Interventional Cardiology | Admitting: Interventional Cardiology

## 2011-05-16 DIAGNOSIS — R0602 Shortness of breath: Secondary | ICD-10-CM

## 2011-05-16 DIAGNOSIS — I509 Heart failure, unspecified: Secondary | ICD-10-CM | POA: Diagnosis present

## 2011-05-16 DIAGNOSIS — E8779 Other fluid overload: Secondary | ICD-10-CM | POA: Diagnosis present

## 2011-05-16 DIAGNOSIS — Z91199 Patient's noncompliance with other medical treatment and regimen due to unspecified reason: Secondary | ICD-10-CM

## 2011-05-16 DIAGNOSIS — F17200 Nicotine dependence, unspecified, uncomplicated: Secondary | ICD-10-CM | POA: Diagnosis present

## 2011-05-16 DIAGNOSIS — Z9119 Patient's noncompliance with other medical treatment and regimen: Secondary | ICD-10-CM

## 2011-05-16 DIAGNOSIS — Z23 Encounter for immunization: Secondary | ICD-10-CM

## 2011-05-16 DIAGNOSIS — I5031 Acute diastolic (congestive) heart failure: Principal | ICD-10-CM | POA: Diagnosis present

## 2011-05-16 DIAGNOSIS — Z794 Long term (current) use of insulin: Secondary | ICD-10-CM

## 2011-05-16 DIAGNOSIS — E1169 Type 2 diabetes mellitus with other specified complication: Secondary | ICD-10-CM | POA: Diagnosis present

## 2011-05-16 DIAGNOSIS — E785 Hyperlipidemia, unspecified: Secondary | ICD-10-CM | POA: Diagnosis present

## 2011-05-16 DIAGNOSIS — Z7982 Long term (current) use of aspirin: Secondary | ICD-10-CM

## 2011-05-16 DIAGNOSIS — Z79899 Other long term (current) drug therapy: Secondary | ICD-10-CM

## 2011-05-16 DIAGNOSIS — Z87440 Personal history of urinary (tract) infections: Secondary | ICD-10-CM

## 2011-05-16 DIAGNOSIS — I10 Essential (primary) hypertension: Secondary | ICD-10-CM | POA: Diagnosis present

## 2011-05-16 DIAGNOSIS — Z8614 Personal history of Methicillin resistant Staphylococcus aureus infection: Secondary | ICD-10-CM

## 2011-05-16 LAB — CBC
HCT: 37.7 % (ref 36.0–46.0)
Hemoglobin: 12.8 g/dL (ref 12.0–15.0)
MCH: 29.6 pg (ref 26.0–34.0)
MCHC: 34 g/dL (ref 30.0–36.0)
MCV: 87.3 fL (ref 78.0–100.0)
Platelets: 342 10*3/uL (ref 150–400)
RBC: 4.32 MIL/uL (ref 3.87–5.11)
RDW: 12.6 % (ref 11.5–15.5)
WBC: 11.9 10*3/uL — ABNORMAL HIGH (ref 4.0–10.5)

## 2011-05-16 LAB — DIFFERENTIAL
Basophils Absolute: 0 10*3/uL (ref 0.0–0.1)
Basophils Relative: 0 % (ref 0–1)
Eosinophils Absolute: 0.3 10*3/uL (ref 0.0–0.7)
Eosinophils Relative: 2 % (ref 0–5)
Lymphocytes Relative: 24 % (ref 12–46)
Lymphs Abs: 2.9 10*3/uL (ref 0.7–4.0)
Monocytes Absolute: 0.4 10*3/uL (ref 0.1–1.0)
Monocytes Relative: 3 % (ref 3–12)
Neutro Abs: 8.4 10*3/uL — ABNORMAL HIGH (ref 1.7–7.7)
Neutrophils Relative %: 70 % (ref 43–77)

## 2011-05-16 LAB — PRO B NATRIURETIC PEPTIDE: Pro B Natriuretic peptide (BNP): 5654 pg/mL — ABNORMAL HIGH (ref 0–125)

## 2011-05-16 LAB — BASIC METABOLIC PANEL
BUN: 14 mg/dL (ref 6–23)
CO2: 29 mEq/L (ref 19–32)
Calcium: 9.3 mg/dL (ref 8.4–10.5)
Chloride: 94 mEq/L — ABNORMAL LOW (ref 96–112)
Creatinine, Ser: 0.94 mg/dL (ref 0.50–1.10)
GFR calc Af Amer: 75 mL/min — ABNORMAL LOW (ref >90)
GFR calc non Af Amer: 65 mL/min — ABNORMAL LOW (ref >90)
Glucose, Bld: 267 mg/dL — ABNORMAL HIGH (ref 70–99)
Potassium: 2.9 mEq/L — ABNORMAL LOW (ref 3.5–5.1)
Sodium: 134 mEq/L — ABNORMAL LOW (ref 135–145)

## 2011-05-16 LAB — CK: Total CK: 72 U/L (ref 7–177)

## 2011-05-16 LAB — TROPONIN I: Troponin I: <0.3 ng/mL (ref <0.30)

## 2011-05-17 LAB — CBC
HCT: 31.9 % — ABNORMAL LOW (ref 36.0–46.0)
Hemoglobin: 10.5 g/dL — ABNORMAL LOW (ref 12.0–15.0)
MCH: 28.7 pg (ref 26.0–34.0)
MCHC: 32.9 g/dL (ref 30.0–36.0)
MCV: 87.2 fL (ref 78.0–100.0)
Platelets: 352 10*3/uL (ref 150–400)
RBC: 3.66 MIL/uL — ABNORMAL LOW (ref 3.87–5.11)
RDW: 12.7 % (ref 11.5–15.5)
WBC: 12.8 10*3/uL — ABNORMAL HIGH (ref 4.0–10.5)

## 2011-05-17 LAB — TSH: TSH: 1.241 u[IU]/mL (ref 0.350–4.500)

## 2011-05-17 LAB — CK TOTAL AND CKMB (NOT AT ARMC)
CK, MB: 3.6 ng/mL (ref 0.3–4.0)
CK, MB: 3.5 ng/mL (ref 0.3–4.0)
Relative Index: INVALID (ref 0.0–2.5)
Relative Index: INVALID (ref 0.0–2.5)
Total CK: 52 U/L (ref 7–177)
Total CK: 54 U/L (ref 7–177)

## 2011-05-17 LAB — BASIC METABOLIC PANEL
BUN: 17 mg/dL (ref 6–23)
CO2: 30 mEq/L (ref 19–32)
Calcium: 8.7 mg/dL (ref 8.4–10.5)
Chloride: 98 mEq/L (ref 96–112)
Creatinine, Ser: 1.18 mg/dL — ABNORMAL HIGH (ref 0.50–1.10)
GFR calc Af Amer: 57 mL/min — ABNORMAL LOW (ref >90)
GFR calc non Af Amer: 49 mL/min — ABNORMAL LOW (ref >90)
Glucose, Bld: 197 mg/dL — ABNORMAL HIGH (ref 70–99)
Potassium: 3.8 mEq/L (ref 3.5–5.1)
Sodium: 137 mEq/L (ref 135–145)

## 2011-05-17 LAB — MRSA PCR SCREENING: MRSA by PCR: NEGATIVE

## 2011-05-17 LAB — GLUCOSE, CAPILLARY
Glucose-Capillary: 188 mg/dL — ABNORMAL HIGH (ref 70–99)
Glucose-Capillary: 73 mg/dL (ref 70–99)
Glucose-Capillary: 144 mg/dL — ABNORMAL HIGH (ref 70–99)
Glucose-Capillary: 74 mg/dL (ref 70–99)

## 2011-05-17 LAB — HEMOGLOBIN A1C
Hgb A1c MFr Bld: 9.3 % — ABNORMAL HIGH (ref <5.7)
Mean Plasma Glucose: 220 mg/dL — ABNORMAL HIGH (ref <117)

## 2011-05-17 LAB — TROPONIN I
Troponin I: <0.3 ng/mL (ref <0.30)
Troponin I: <0.3 ng/mL (ref <0.30)

## 2011-05-18 LAB — GLUCOSE, CAPILLARY
Glucose-Capillary: 128 mg/dL — ABNORMAL HIGH (ref 70–99)
Glucose-Capillary: 60 mg/dL — ABNORMAL LOW (ref 70–99)
Glucose-Capillary: 84 mg/dL (ref 70–99)
Glucose-Capillary: 107 mg/dL — ABNORMAL HIGH (ref 70–99)
Glucose-Capillary: 107 mg/dL — ABNORMAL HIGH (ref 70–99)

## 2011-05-18 LAB — BASIC METABOLIC PANEL
BUN: 21 mg/dL (ref 6–23)
CO2: 30 mEq/L (ref 19–32)
Calcium: 8.9 mg/dL (ref 8.4–10.5)
Chloride: 96 mEq/L (ref 96–112)
Creatinine, Ser: 1.08 mg/dL (ref 0.50–1.10)
GFR calc Af Amer: 63 mL/min — ABNORMAL LOW (ref >90)
GFR calc non Af Amer: 55 mL/min — ABNORMAL LOW (ref >90)
Glucose, Bld: 134 mg/dL — ABNORMAL HIGH (ref 70–99)
Potassium: 4.1 mEq/L (ref 3.5–5.1)
Sodium: 133 mEq/L — ABNORMAL LOW (ref 135–145)

## 2011-05-18 LAB — PRO B NATRIURETIC PEPTIDE
Pro B Natriuretic peptide (BNP): 3200 pg/mL — ABNORMAL HIGH (ref 0–125)
Pro B Natriuretic peptide (BNP): 3325 pg/mL — ABNORMAL HIGH (ref 0–125)

## 2011-05-19 LAB — BASIC METABOLIC PANEL
BUN: 19 mg/dL (ref 6–23)
CO2: 28 mEq/L (ref 19–32)
Calcium: 9.3 mg/dL (ref 8.4–10.5)
Chloride: 94 mEq/L — ABNORMAL LOW (ref 96–112)
Creatinine, Ser: 1.05 mg/dL (ref 0.50–1.10)
GFR calc Af Amer: 66 mL/min — ABNORMAL LOW (ref >90)
GFR calc non Af Amer: 57 mL/min — ABNORMAL LOW (ref >90)
Glucose, Bld: 135 mg/dL — ABNORMAL HIGH (ref 70–99)
Potassium: 4.2 mEq/L (ref 3.5–5.1)
Sodium: 132 mEq/L — ABNORMAL LOW (ref 135–145)

## 2011-05-19 LAB — GLUCOSE, CAPILLARY
Glucose-Capillary: 139 mg/dL — ABNORMAL HIGH (ref 70–99)
Glucose-Capillary: 208 mg/dL — ABNORMAL HIGH (ref 70–99)
Glucose-Capillary: 192 mg/dL — ABNORMAL HIGH (ref 70–99)
Glucose-Capillary: 196 mg/dL — ABNORMAL HIGH (ref 70–99)

## 2011-05-20 LAB — BASIC METABOLIC PANEL
BUN: 22 mg/dL (ref 6–23)
CO2: 32 mEq/L (ref 19–32)
Calcium: 9.3 mg/dL (ref 8.4–10.5)
Chloride: 96 mEq/L (ref 96–112)
Creatinine, Ser: 1.02 mg/dL (ref 0.50–1.10)
GFR calc Af Amer: 68 mL/min — ABNORMAL LOW (ref >90)
GFR calc non Af Amer: 59 mL/min — ABNORMAL LOW (ref >90)
Glucose, Bld: 171 mg/dL — ABNORMAL HIGH (ref 70–99)
Potassium: 4.3 mEq/L (ref 3.5–5.1)
Sodium: 134 mEq/L — ABNORMAL LOW (ref 135–145)

## 2011-05-20 LAB — PRO B NATRIURETIC PEPTIDE: Pro B Natriuretic peptide (BNP): 2924 pg/mL — ABNORMAL HIGH (ref 0–125)

## 2011-05-20 LAB — GLUCOSE, CAPILLARY: Glucose-Capillary: 158 mg/dL — ABNORMAL HIGH (ref 70–99)

## 2011-05-25 NOTE — Discharge Summary (Signed)
  Destiny Day, Destiny Day            ACCOUNT NO.:  [id number]  MEDICAL RECORD NO.:  [id number]  LOCATION:  [id number]                         FACILITY:  [facility]  PHYSICIAN:  Jettie Booze, MDDATE OF BIRTH:  1950-08-22  DATE OF ADMISSION:  05/16/2011 DATE OF DISCHARGE:  05/20/2011                              DISCHARGE SUMMARY   FINAL DIAGNOSES: 1. Acute diastolic heart failure. 2. Hypertension. 3. Fluid overload. 4. Diabetes.  HOSPITAL COURSE:  The patient was admitted due to shortness of breath. She was found to have pulmonary edema and significantly elevated blood pressure.  She had been out of her medicines and she also had been eating excessive salt at fast food restaurants.  She was having trouble affording her medications and therefore was not taking some of them. She was diuresed in the hospital.  Her home medicines were restarted. She still had elevated blood pressure.  Spironolactone 25 mg daily was added and her blood pressure was better controlled.  She diuresed well. Her kidney function stayed stable as did her potassium.  She walked in the halls on the day of discharge, and had no further chest discomfort. She did request a Podiatry consult, which we could not do as an inpatient.  She will follow up with them as an outpatient.  She also had diabetes education.  She had a couple of hypoglycemic episodes while in the hospital.  Overall, she felt well at the time of discharge.  I stressed the importance of taking her medications regularly.  She is agreeable.  We set up home health for CHF check.  DISCHARGE MEDICATIONS: 1. Furosemide 40 mg p.o. b.i.d. 2. Spironolactone 25 mg daily. 3. Xanax 0.25 mg b.i.d. p.r.n. 4. Amlodipine 10 mg daily. 5. Aspirin 81 mg daily. 6. Cetirizine 10 mg daily p.r.n. 7. Celexa 20 mg daily. 8. Vitamin D. 9. Humulin 70/30. 10.Hydralazine 50 mg p.o. t.i.d. 11.Potassium 20 mEq b.i.d. 12.Losartan 100 mg p.o. daily. 13.Metformin 500 mg  b.i.d. 14.Metoprolol 100 mg p.o. b.i.d. 15.Polyethylene glycol p.r.n. 16.Simvastatin 40 mg daily.  INSTRUCTIONS:  Follow up with Dr. Hassell Done office on May 27, 2011, at 10:15 a.m.  She will also have labs at that time.  Of note, we will likely have to switch her simvastatin from the 40 mg dose to either atorvastatin 20 mg or simvastatin due to the interaction with amlodipine, currently are working on finding out where the cheapest medicines for her would be.  ACTIVITY:  Increase activity slowly.  She should try to increase her exercise.  DIET:  Low-sodium heart healthy diet.  SPECIAL INSTRUCTIONS:  She will call us if she has any further shortness of breath.     Jettie Booze, MD     JSV/MEDQ  D:  05/21/2011  T:  05/21/2011  Job:  [id number]  Electronically Signed by Larae Grooms MD on 05/25/2011 01:26:33 PM

## 2011-05-26 NOTE — H&P (Signed)
Destiny Day, Destiny Day            ACCOUNT NO.:  [id number]  MEDICAL RECORD NO.:  [id number]  LOCATION:  2602                         FACILITY:  [facility]  PHYSICIAN:  Minus Breeding, MD, FACCDATE OF BIRTH:  [birth year]  DATE OF ADMISSION:  05/16/2011 DATE OF DISCHARGE:                             HISTORY & PHYSICAL   PRIMARY PHYSICIAN:  Wenda Low, MD  CARDIOLOGIST:  Jettie Booze, MD  REASON FOR PRESENTATION:  Evaluate the patient with acute shortness of breath.  HISTORY OF PRESENT ILLNESS:  The patient is a pleasant 60 year old female with a history of diastolic dysfunction.  She has had a preserved ejection fraction.  However, she has had previous presentations with acute pulmonary edema.  She has had very difficult to control high blood pressure over the years.  She actually has not been taking all of her medications because she has been trying to make them last.  Her co-pays are too expensive.  She does not always watch her salt.  Earlier today, she had C[employer] rtice and [employer].  She later in the evening developed some shortness of breath, followed by anxiety, followed by progressive acute dyspnea.  By the time she presented to the emergency room, her blood pressure was 238/133.  She was in acute pulmonary edema.  She was treated with IV diuresis, BiPAP and Lasix and had significant improvement.  She is now quite comfortable.  She has not been having any chest discomfort, neck or arm discomfort.  She has not been having any palpitations, presyncope or syncope.  She has had no new shortness of breath prior to this.  She sleeps chronically with her head elevated, but is not describing classic PND or orthopnea.  She does not weigh herself daily, but she has had no swelling.  PAST MEDICAL HISTORY:  Diastolic dysfunction with congestive heart failure, hypertension, difficult to control, diabetes, hyperlipidemia, MRSA, UTI.  PAST SURGICAL HISTORY:   Hysterectomy.  ALLERGIES:  None.  MEDICATIONS: 1. Alprazolam. 2. Losartan. 3. Amlodipine 10 mg daily. 4. Aspirin 81 mg daily. 5. Cetirizine. 6. Hydralazine 50 mg t.i.d. 7. Lasix 40 mg b.i.d. p.r.n. 8. Metoprolol 100 mg b.i.d. 9. Metformin 500 mg b.i.d. 10.Citalopram. 11.Potassium 20 mEq daily. 12.Vitamin D. 13.Humulin insulin as needed. 14.MiraLax. 15.Simvastatin 40 mg daily.  SOCIAL HISTORY:  The patient does still smoke a few cigarettes daily, but she is finding these hard to afford.  She is married.  FAMILY HISTORY:  Noncontributory for early coronary artery disease.  REVIEW OF SYSTEMS:  As stated in the HPI and otherwise negative for all other systems.  PHYSICAL EXAMINATION:  GENERAL:  The patient is in no distress. VITAL SIGNS:  Blood pressure 155/90, heart rate 76 and regular, afebrile. HEENT:  Eyelids are unremarkable.  Pupils equal, round and reactive to light.  Fundi not visualized, oral mucosa unremarkable, poor dentition. NECK:  No jugular venous distention at 45 degrees.  Carotid upstroke brisk and symmetric.  No bruits, no thyromegaly. LYMPHATICS:  No cervical, axillary or inguinal adenopathy. LUNGS:  Bilateral diffuse crackles, no wheezing. BACK:  No costovertebral angle tenderness. CHEST:  Unremarkable. HEART:  PMI not displaced or sustained, distant heart sounds.  S1 and S2 within normal  limits.  No S3, no S4.  No clicks, no rubs, no murmurs. ABDOMEN:  Flat, positive bowel sounds.  Normal in frequency and pitch. No bruits, no rebound, no guarding or midline pulsatile mass.  No hepatomegaly, splenomegaly. SKIN:  No rashes, no nodules. EXTREMITIES:  Pulses 2+ throughout.  No edema, no cyanosis or clubbing. NEUROLOGIC:  Oriented to person, place and time.  Cranial nerves II through XII grossly intact, motor grossly intact throughout.  EKG sinus rhythm, rate 97, axis within normal limits, QTc prolonged, poor anterior R-wave progression, no acute ST-T  wave changes.  LABORATORY DATA:  Sodium 134, potassium 2.9, BUN 14, creatinine 0.94, WBC 11.9, hemoglobin 12.8, platelets 342. Chest x-ray, bilateral acute pulmonary edema.  ASSESSMENT AND PLAN: 1. Congestive heart failure.  This is secondary to hypertensive     urgency combined with missing her medications and salt as well as     diastolic dysfunction.  I had a long discussion with her and her     husband about this.  She is currently oxygenating well and I will     switch her to nasal cannula off of her BiPAP.  I will continue her     IV nitroglycerin for now and resume her previous medications.  She     will get IV Lasix.  She will need education.  She ultimately needs     to be switched to generic medications to try to lower her co-pay.     Her list appears to be predominantly generic now, but there maybe     some tweaking we can do. 2. Hypertension.  Blood pressure will be managed as above. 3. Diabetes.  I will continue the previous medications with sliding     scale insulin.  I will check a hemoglobin A1c. 4. Dyslipidemia.  She ultimately could not be sent home on Zocor as     she is on the amlodipine.  This should be changed.  She will have     Lipitor substituted in the hospital. 5. Tobacco. She has been educated and we will continue to do this.     Minus Breeding, MD, [facility]     JH/MEDQ  D:  05/17/2011  T:  05/17/2011  Job:  [id number]  Electronically Signed by Minus Breeding MD [facility] on 05/26/2011 05:46:55 PM

## 2011-08-22 ENCOUNTER — Encounter (HOSPITAL_COMMUNITY): Payer: Self-pay

## 2011-08-22 ENCOUNTER — Inpatient Hospital Stay (HOSPITAL_COMMUNITY)
Admission: EM | Admit: 2011-08-22 | Discharge: 2011-08-24 | DRG: 127 | Disposition: A | Discharge status: Home Health | Payer: BC Managed Care – PPO | Attending: Internal Medicine | Admitting: Internal Medicine

## 2011-08-22 ENCOUNTER — Emergency Department (HOSPITAL_COMMUNITY): Payer: BC Managed Care – PPO

## 2011-08-22 ENCOUNTER — Other Ambulatory Visit: Payer: Self-pay

## 2011-08-22 DIAGNOSIS — Z79899 Other long term (current) drug therapy: Secondary | ICD-10-CM

## 2011-08-22 DIAGNOSIS — Z9119 Patient's noncompliance with other medical treatment and regimen: Secondary | ICD-10-CM

## 2011-08-22 DIAGNOSIS — Z794 Long term (current) use of insulin: Secondary | ICD-10-CM

## 2011-08-22 DIAGNOSIS — Z91199 Patient's noncompliance with other medical treatment and regimen due to unspecified reason: Secondary | ICD-10-CM

## 2011-08-22 DIAGNOSIS — R9431 Abnormal electrocardiogram [ECG] [EKG]: Secondary | ICD-10-CM | POA: Diagnosis present

## 2011-08-22 DIAGNOSIS — E119 Type 2 diabetes mellitus without complications: Secondary | ICD-10-CM

## 2011-08-22 DIAGNOSIS — I5033 Acute on chronic diastolic (congestive) heart failure: Principal | ICD-10-CM

## 2011-08-22 DIAGNOSIS — Z7982 Long term (current) use of aspirin: Secondary | ICD-10-CM

## 2011-08-22 DIAGNOSIS — K5730 Diverticulosis of large intestine without perforation or abscess without bleeding: Secondary | ICD-10-CM | POA: Diagnosis present

## 2011-08-22 DIAGNOSIS — F17200 Nicotine dependence, unspecified, uncomplicated: Secondary | ICD-10-CM | POA: Diagnosis present

## 2011-08-22 DIAGNOSIS — I10 Essential (primary) hypertension: Secondary | ICD-10-CM | POA: Diagnosis present

## 2011-08-22 DIAGNOSIS — IMO0001 Reserved for inherently not codable concepts without codable children: Secondary | ICD-10-CM | POA: Diagnosis present

## 2011-08-22 DIAGNOSIS — I509 Heart failure, unspecified: Secondary | ICD-10-CM

## 2011-08-22 DIAGNOSIS — E785 Hyperlipidemia, unspecified: Secondary | ICD-10-CM

## 2011-08-22 HISTORY — DX: Hyperlipidemia, unspecified: E78.5

## 2011-08-22 HISTORY — DX: Essential (primary) hypertension: I10

## 2011-08-22 HISTORY — DX: Diverticulitis of intestine, part unspecified, without perforation or abscess without bleeding: K57.92

## 2011-08-22 LAB — DIFFERENTIAL
Basophils Absolute: 0 10*3/uL (ref 0.0–0.1)
Basophils Relative: 0 % (ref 0–1)
Eosinophils Absolute: 0.2 10*3/uL (ref 0.0–0.7)
Eosinophils Relative: 2 % (ref 0–5)
Lymphocytes Relative: 25 % (ref 12–46)
Lymphs Abs: 2.7 10*3/uL (ref 0.7–4.0)
Monocytes Absolute: 0.5 10*3/uL (ref 0.1–1.0)
Monocytes Relative: 5 % (ref 3–12)
Neutro Abs: 7.3 10*3/uL (ref 1.7–7.7)
Neutrophils Relative %: 68 % (ref 43–77)

## 2011-08-22 LAB — CBC
HCT: 35.5 % — ABNORMAL LOW (ref 36.0–46.0)
Hemoglobin: 11.8 g/dL — ABNORMAL LOW (ref 12.0–15.0)
MCH: 28.8 pg (ref 26.0–34.0)
MCHC: 33.2 g/dL (ref 30.0–36.0)
MCV: 86.6 fL (ref 78.0–100.0)
Platelets: 363 10*3/uL (ref 150–400)
RBC: 4.1 MIL/uL (ref 3.87–5.11)
RDW: 13.6 % (ref 11.5–15.5)
WBC: 10.8 10*3/uL — ABNORMAL HIGH (ref 4.0–10.5)

## 2011-08-22 LAB — URINALYSIS, ROUTINE W REFLEX MICROSCOPIC
Bilirubin Urine: NEGATIVE
Glucose, UA: NEGATIVE mg/dL
Ketones, ur: NEGATIVE mg/dL
Leukocytes, UA: NEGATIVE
Nitrite: NEGATIVE
Protein, ur: 100 mg/dL — AB
Specific Gravity, Urine: 1.008 (ref 1.005–1.030)
Urobilinogen, UA: 0.2 mg/dL (ref 0.0–1.0)
pH: 6.5 (ref 5.0–8.0)

## 2011-08-22 LAB — BASIC METABOLIC PANEL
BUN: 14 mg/dL (ref 6–23)
CO2: 27 mEq/L (ref 19–32)
Calcium: 9.1 mg/dL (ref 8.4–10.5)
Chloride: 102 mEq/L (ref 96–112)
Creatinine, Ser: 0.76 mg/dL (ref 0.50–1.10)
GFR calc Af Amer: >90 mL/min (ref >90)
GFR calc non Af Amer: 90 mL/min — ABNORMAL LOW (ref >90)
Glucose, Bld: 110 mg/dL — ABNORMAL HIGH (ref 70–99)
Potassium: 3.5 mEq/L (ref 3.5–5.1)
Sodium: 141 mEq/L (ref 135–145)

## 2011-08-22 LAB — POCT I-STAT TROPONIN I: Troponin i, poc: 0.01 ng/mL (ref 0.00–0.08)

## 2011-08-22 LAB — GLUCOSE, CAPILLARY
Glucose-Capillary: 217 mg/dL — ABNORMAL HIGH (ref 70–99)
Glucose-Capillary: 164 mg/dL — ABNORMAL HIGH (ref 70–99)
Glucose-Capillary: 139 mg/dL — ABNORMAL HIGH (ref 70–99)

## 2011-08-22 LAB — CARDIAC PANEL(CRET KIN+CKTOT+MB+TROPI)
CK, MB: 4 ng/mL (ref 0.3–4.0)
Relative Index: INVALID (ref 0.0–2.5)
Total CK: 74 U/L (ref 7–177)
Troponin I: <0.3 ng/mL (ref <0.30)

## 2011-08-22 LAB — URINE MICROSCOPIC-ADD ON

## 2011-08-22 LAB — MAGNESIUM: Magnesium: 1.5 mg/dL (ref 1.5–2.5)

## 2011-08-22 LAB — HEMOGLOBIN A1C
Hgb A1c MFr Bld: 7.4 % — ABNORMAL HIGH (ref <5.7)
Mean Plasma Glucose: 166 mg/dL — ABNORMAL HIGH (ref <117)

## 2011-08-22 LAB — PRO B NATRIURETIC PEPTIDE: Pro B Natriuretic peptide (BNP): 5539 pg/mL — ABNORMAL HIGH (ref 0–125)

## 2011-08-22 MED ORDER — AMLODIPINE BESYLATE 10 MG PO TABS
10.0000 mg | ORAL_TABLET | Freq: Every day | ORAL | Status: DC
  Administered 2011-08-22 – 2011-08-24 (×3): 10 mg via ORAL
  Filled 2011-08-22 (×3): qty 1

## 2011-08-22 MED ORDER — SPIRONOLACTONE 25 MG PO TABS
25.0000 mg | ORAL_TABLET | Freq: Every day | ORAL | Status: DC
  Administered 2011-08-22 – 2011-08-24 (×3): 25 mg via ORAL
  Filled 2011-08-22 (×3): qty 1

## 2011-08-22 MED ORDER — ACETAMINOPHEN 325 MG PO TABS: 650.0000 mg | ORAL_TABLET | Freq: Four times a day (QID) | ORAL | Status: DC | PRN

## 2011-08-22 MED ORDER — FUROSEMIDE 10 MG/ML IJ SOLN
40.0000 mg | Freq: Once | INTRAMUSCULAR | Status: AC
  Administered 2011-08-22: 40 mg via INTRAVENOUS
  Filled 2011-08-22: qty 4

## 2011-08-22 MED ORDER — HYDRALAZINE HCL 50 MG PO TABS
50.0000 mg | ORAL_TABLET | Freq: Three times a day (TID) | ORAL | Status: DC
  Administered 2011-08-22 – 2011-08-23 (×3): 50 mg via ORAL
  Filled 2011-08-22 (×5): qty 1

## 2011-08-22 MED ORDER — ALBUTEROL SULFATE (5 MG/ML) 0.5% IN NEBU: 2.5000 mg | INHALATION_SOLUTION | RESPIRATORY_TRACT | Status: DC | PRN

## 2011-08-22 MED ORDER — METFORMIN HCL 500 MG PO TABS
500.0000 mg | ORAL_TABLET | Freq: Two times a day (BID) | ORAL | Status: DC
  Administered 2011-08-22 – 2011-08-24 (×4): 500 mg via ORAL
  Filled 2011-08-22 (×6): qty 1

## 2011-08-22 MED ORDER — INSULIN ASPART 100 UNIT/ML ~~LOC~~ SOLN
0.0000 [IU] | Freq: Three times a day (TID) | SUBCUTANEOUS | Status: DC
  Administered 2011-08-22 – 2011-08-23 (×2): 2 [IU] via SUBCUTANEOUS
  Administered 2011-08-23: 1 [IU] via SUBCUTANEOUS
  Administered 2011-08-23: 2 [IU] via SUBCUTANEOUS
  Administered 2011-08-24: 1 [IU] via SUBCUTANEOUS
  Filled 2011-08-22: qty 3

## 2011-08-22 MED ORDER — SODIUM CHLORIDE 0.9 % IV SOLN
INTRAVENOUS | Status: DC
  Administered 2011-08-22: 09:00:00 via INTRAVENOUS

## 2011-08-22 MED ORDER — ROSUVASTATIN CALCIUM 10 MG PO TABS
10.0000 mg | ORAL_TABLET | Freq: Every evening | ORAL | Status: DC
Start: 2011-08-22 — End: 2011-08-24
  Administered 2011-08-22 – 2011-08-23 (×2): 10 mg via ORAL
  Filled 2011-08-22 (×3): qty 1

## 2011-08-22 MED ORDER — ENOXAPARIN SODIUM 40 MG/0.4ML ~~LOC~~ SOLN
40.0000 mg | SUBCUTANEOUS | Status: DC
  Administered 2011-08-22 – 2011-08-23 (×2): 40 mg via SUBCUTANEOUS
  Filled 2011-08-22 (×3): qty 0.4

## 2011-08-22 MED ORDER — METOPROLOL TARTRATE 100 MG PO TABS
100.0000 mg | ORAL_TABLET | Freq: Two times a day (BID) | ORAL | Status: DC
Start: 2011-08-22 — End: 2011-08-24
  Administered 2011-08-22 – 2011-08-24 (×4): 100 mg via ORAL
  Filled 2011-08-22 (×5): qty 1

## 2011-08-22 MED ORDER — POTASSIUM CHLORIDE CRYS ER 20 MEQ PO TBCR
20.0000 meq | EXTENDED_RELEASE_TABLET | Freq: Two times a day (BID) | ORAL | Status: DC
  Administered 2011-08-22 – 2011-08-24 (×5): 20 meq via ORAL
  Filled 2011-08-22 (×6): qty 1

## 2011-08-22 MED ORDER — INSULIN ASPART 100 UNIT/ML ~~LOC~~ SOLN: 0.0000 [IU] | Freq: Every day | SUBCUTANEOUS | Status: DC

## 2011-08-22 MED ORDER — ACETAMINOPHEN 650 MG RE SUPP: 650.0000 mg | Freq: Four times a day (QID) | RECTAL | Status: DC | PRN

## 2011-08-22 MED ORDER — ASPIRIN 81 MG PO CHEW
81.0000 mg | CHEWABLE_TABLET | Freq: Every day | ORAL | Status: DC
  Administered 2011-08-22 – 2011-08-24 (×3): 81 mg via ORAL
  Filled 2011-08-22 (×3): qty 1

## 2011-08-22 MED ORDER — INSULIN ASPART PROT & ASPART (70-30 MIX) 100 UNIT/ML ~~LOC~~ SUSP
15.0000 [IU] | Freq: Two times a day (BID) | SUBCUTANEOUS | Status: DC
  Administered 2011-08-22 – 2011-08-24 (×4): 15 [IU] via SUBCUTANEOUS
  Filled 2011-08-22: qty 3

## 2011-08-22 MED ORDER — LOSARTAN POTASSIUM 50 MG PO TABS
100.0000 mg | ORAL_TABLET | Freq: Every day | ORAL | Status: DC
  Administered 2011-08-23 – 2011-08-24 (×2): 100 mg via ORAL
  Filled 2011-08-22 (×2): qty 2

## 2011-08-22 MED ORDER — POTASSIUM CHLORIDE CRYS ER 20 MEQ PO TBCR
40.0000 meq | EXTENDED_RELEASE_TABLET | Freq: Once | ORAL | Status: AC
  Administered 2011-08-22: 40 meq via ORAL
  Filled 2011-08-22: qty 2

## 2011-08-22 MED ORDER — FUROSEMIDE 10 MG/ML IJ SOLN
40.0000 mg | Freq: Two times a day (BID) | INTRAMUSCULAR | Status: DC
  Administered 2011-08-22 – 2011-08-24 (×4): 40 mg via INTRAVENOUS
  Filled 2011-08-22 (×6): qty 4

## 2011-08-22 MED ORDER — HYDRALAZINE HCL 20 MG/ML IJ SOLN
10.0000 mg | Freq: Once | INTRAMUSCULAR | Status: AC
  Administered 2011-08-22: 10 mg via INTRAVENOUS
  Filled 2011-08-22: qty 0.5

## 2011-08-22 NOTE — ED Notes (Signed)
This RN with 2 IV attempts, unsuccessful, another RN in to attempt

## 2011-08-22 NOTE — H&P (Addendum)
PCP:   Wenda Low, MD, MD   Primary cardiologist: Dr. Thamas Jaegers  Chief Complaint:  Worsening dyspnea and cough  HPI: 61 year old African American female patient with history of difficult to control hypertension, type 2 diabetes mellitus/insulin-dependent, chronic diastolic congestive heart failure, hyperlipidemia who indicates that she was in her usual state of health until approximately 4 AM today. She denies any dyspnea, dyspnea on exertion, chest pain, palpitations or cough when she went to bed last night. She complains of easy fatigability since diagnosis of congestive heart failure in May of 2012. She also chronically sleeps on 4-5 pillows. At approximately 4 this morning she woke up to use the bathroom and on returning noticed that she was having difficulty breathing and started coughing white colored sputum. The symptoms progressively got worse and she informed her husband who brought her to the emergency department. She denies any chest pain, fevers or chills or sickly contacts. She denies leg swelling or abdominal swelling. She and her husband volunteer to compliance with her medications but they do verbalize that she has been chronically consuming food with excess salt content at home and drinks a lot of liquids. In the emergency department she was found to have markedly elevated blood pressures of 230/108 mmHg and clinical picture suggestive of decompensated congestive heart failure. She has not taken her medications today. She was given a dose of IV Lasix and patient indicates that she feels a whole lot better with improvement in her dyspnea. [facility]ologists were contacted by the ED physician and the cardiologist recommended that the hospitalists admit for further evaluation and management.  Past Medical History: Past Medical History  Diagnosis Date  . CHF (congestive heart failure)   . Diabetes mellitus   . Hypertension   . Diverticulitis   . Hyperlipidemia     Past  Surgical History: Past Surgical History  Procedure Date  . Abdominal hysterectomy     Allergies:   Allergies  Allergen Reactions  . Lisinopril Cough    Medications: Prior to Admission medications   Medication Sig Start Date End Date Taking? Authorizing Provider  amLODipine (NORVASC) 10 MG tablet Take 10 mg by mouth daily.   Yes Historical Provider, MD  aspirin 81 MG chewable tablet Chew 81 mg by mouth daily.   Yes Historical Provider, MD  furosemide (LASIX) 40 MG tablet Take 40 mg by mouth 2 (two) times daily.   Yes Historical Provider, MD  hydrALAZINE (APRESOLINE) 50 MG tablet Take 50 mg by mouth 3 (three) times daily.   Yes Historical Provider, MD  Insulin Isophane & Regular (HUMULIN 70/30 [place]) Inject 15-20 Units into the skin 2 (two) times daily. Takes 20 units in the morning and 15 units in the evening   Yes Historical Provider, MD  losartan (COZAAR) 100 MG tablet Take 100 mg by mouth daily.   Yes Historical Provider, MD  metFORMIN (GLUCOPHAGE) 500 MG tablet Take 500 mg by mouth 2 (two) times daily with a meal.   Yes Historical Provider, MD  metoprolol (LOPRESSOR) 100 MG tablet Take 100 mg by mouth 2 (two) times daily.   Yes Historical Provider, MD  potassium chloride SA (K-DUR,KLOR-CON) 20 MEQ tablet Take 20 mEq by mouth 2 (two) times daily. Due to the size of this pill, the patient may take by dissolving in a spoonful of water   Yes Historical Provider, MD  simvastatin (ZOCOR) 40 MG tablet Take 40 mg by mouth every evening.   Yes Historical Provider, MD  spironolactone (ALDACTONE) 25  MG tablet Take 25 mg by mouth daily.   Yes Historical Provider, MD    Family History: History reviewed. No pertinent family history.  Social History:  reports that she has been smoking.  She does not have any smokeless tobacco history on file. She reports that she does not drink alcohol or use illicit drugs. patient has a 30 year smoking history and has been trying to quit. Currently she's been  smoking one to 2 cigarettes per day. She is a retired Optometrist. She lives with her spouse and is independent of activities of daily living.  Review of Systems:  All systems reviewed and apart from history of presenting illness is negative  Physical Exam: Filed Vitals:   08/22/11 1130 08/22/11 1200 08/22/11 1206 08/22/11 1300  BP: 171/76 139/104 139/104 159/74  Pulse: 84 87 87 88  Resp: 21 28 19    Height:      Weight:      SpO2: 99% 97% 99% 98%   General exam: Moderately built and nourished pleasant female patient who is lying propped up in the gurney comfortably and in no obvious distress. Head, eyes and ENT: Bespectacled, pupils equally reacting to light and accommodation. Head is nontraumatic and normocephalic. Oral mucosa is moist. Lymphatics: No lymphadenopathy. Neck: Supple. JVD present. No carotid bruit. Respiratory system: Slightly decreased breath sounds in the bases with few fine basal crackles. Rest of the lung fields are clear. No increased work of breathing. Cardiovascular system: First and second heart sounds heard, regular. No JVD or pedal edema. Gastrointestinal system: Abdomen is nondistended, soft and normal bowel sounds heard. No organomegaly or masses appreciated. Central nervous system: Alert and oriented. No focal neurological deficits. Extremities: Symmetric 5 x 5 power. Skin: Without any rashes  Labs on Admission:   [facility] 08/22/11 0808  NA 141  K 3.5  CL 102  CO2 27  GLUCOSE 110*  BUN 14  CREATININE 0.76  CALCIUM 9.1  MG --  PHOS --   No results found for this basename: AST:2,ALT:2,ALKPHOS:2,BILITOT:2,PROT:2,ALBUMIN:2 in the last 72 hours No results found for this basename: LIPASE:2,AMYLASE:2 in the last 72 hours  Basename 08/22/11 0808  WBC 10.8*  NEUTROABS 7.3  HGB 11.8*  HCT 35.5*  MCV 86.6  PLT 363   pro BNP is 5539  No results found for this basename: CKTOTAL:3,CKMB:3,CKMBINDEX:3,TROPONINI:3 in the last 72 hours No results  found for this basename: TSH,T4TOTAL,FREET3,T3FREE,THYROIDAB in the last 72 hours No results found for this basename: VITAMINB12:2,FOLATE:2,FERRITIN:2,TIBC:2,IRON:2,RETICCTPCT:2 in the last 72 hours  Radiological Exams on Admission: Dg Chest Port 1 View  08/22/2011  *RADIOLOGY REPORT*  Clinical Data: Evaluate for infiltrate, congestive heart failure  PORTABLE CHEST - 1 VIEW  Comparison: 05/16/2011; 11/30/2010  Findings: Grossly unchanged cardiac silhouette and mediastinal contours.  Overall improved aeration of the bilateral lungs with persistent minimal bibasilar heterogeneous opacities.  Mild pulmonary venous congestion without frank evidence of pulmonary edema.  No pleural effusion or pneumothorax.  Unchanged bones.  IMPRESSION: 1.  Overall improved aeration of the lungs with persistent bibasilar opacities favored to represent atelectasis or scar. 2.  Mild pulmonary venous congestion without frank evidence of pulmonary edema.  Original Report Authenticated By: Rachel Moulds, M.D.   EKG shows sinus rhythm at 93 beats per minute, normal axis, T wave inversion in lateral leads and QTC. of 497 milliseconds      Assessment/Plan 1. Acute on chronic diastolic congestive heart failure, possibly secondary to uncontrolled hypertension and dietary indiscretion. Admit to telemetry.  Cycle cardiac enzymes. Repeat 2-D echocardiogram. Change Lasix to 40 mg IV twice a day. Strict input output and daily weights. Continue aspirin and metoprolol. Patient and spouse have been counseled regarding compliance with diet and fluid restriction. 2. Accelerated hypertension: Resume all home medications and monitor blood pressures closely. 3. Uncontrolled type 2 diabetes mellitus/insulin-dependent: Check hemoglobin A1c and continue home 70/30 insulin and sensitive sliding scale insulin. Monitor. 4. Anemia: Possibly chronic. Follow CBC tomorrow. 5. Prolonged QTc: Keep potassium greater than 4 and magnesium greater than 2.  Repeat EKG tomorrow. 6. Tobacco abuse: Cessation counseled 7. Full code  Discussed patient's care at length with her spouse was at the bedside and answered all questions.   Dejanay Wamboldt 08/22/2011, 1:25 PM

## 2011-08-22 NOTE — ED Notes (Signed)
MD at bedside.admission MD

## 2011-08-22 NOTE — ED Notes (Signed)
P t got up to go to bathroom and  Couldn't go back to sleep, started coughing and having SOB, coughing clear phlegm

## 2011-08-22 NOTE — ED Notes (Signed)
X-ray at bedside

## 2011-08-22 NOTE — ED Notes (Signed)
Patient undressed and in a gown. Cardiac monitor, pulse ox, and bp cuff hooked up and on.

## 2011-08-22 NOTE — ED Notes (Signed)
Dr Lindaann Pascal notified of patient CBG of 217, is writing orders

## 2011-08-22 NOTE — ED Provider Notes (Signed)
History     CSN: [id number]  Arrival date & time 08/22/11  0746     Chief Complaint  Patient presents with  . Shortness of Breath    HPI Pt was seen at 0800.  Per pt, c/o gradual onset and worsening of persistent SOB for the past several days, worse this morning.  Pt states her SOB worsens when she lays down or ambulates/exerts herself.  States she has been unable to "walk as far as she used to" without getting SOB and having to stop and rest for "a while now."  Pt states she has sporadically not been taking her meds as prescribed, often skipping doses, because she was "trying to make them last."  Also endorses she "knows I haven't been eating the right things" regarding her sodium intake.  Denies palpitations/CP, no abd pain, no back pain, no fevers, no N/V/D.     PMD:  Dr. Roland Earl Cards:  Dr. Irish Lack Past Medical History  Diagnosis Date  . CHF (congestive heart failure)   . Diabetes mellitus   . Hypertension   . Diverticulitis   . Hyperlipidemia     Past Surgical History  Procedure Date  . Abdominal hysterectomy     History  Substance Use Topics  . Smoking status: Current Everyday Smoker  . Smokeless tobacco: Not on file  . Alcohol Use: No    Review of Systems ROS: Statement: All systems negative except as marked or noted in the HPI; Constitutional: Negative for fever and chills. ; ; Eyes: Negative for eye pain, redness and discharge. ; ; ENMT: Negative for ear pain, hoarseness, nasal congestion, sinus pressure and sore throat. ; ; Cardiovascular: Negative for chest pain, palpitations, diaphoresis, and peripheral edema. ; ; Respiratory: +SOB, DOE. Negative for cough, wheezing and stridor. ; ; Gastrointestinal: Negative for nausea, vomiting, diarrhea, abdominal pain, blood in stool, hematemesis, jaundice and rectal bleeding. . ; ; Genitourinary: Negative for dysuria, flank pain and hematuria. ; ; Musculoskeletal: Negative for back pain and neck pain. Negative for swelling  and trauma.; ; Skin: Negative for pruritus, rash, abrasions, blisters, bruising and skin lesion.; ; Neuro: Negative for headache, lightheadedness and neck stiffness. Negative for weakness, altered level of consciousness , altered mental status, extremity weakness, paresthesias, involuntary movement, seizure and syncope.     Allergies  Lisinopril  Home Medications   Current Outpatient Rx  Name Route Sig Dispense Refill  . AMLODIPINE BESYLATE 10 MG PO TABS Oral Take 10 mg by mouth daily.    . ASPIRIN 81 MG PO CHEW Oral Chew 81 mg by mouth daily.    . FUROSEMIDE 40 MG PO TABS Oral Take 40 mg by mouth 2 (two) times daily.    Marland Kitchen HYDRALAZINE HCL 50 MG PO TABS Oral Take 50 mg by mouth 3 (three) times daily.    Marland Kitchen HUMULIN 70/30 [place] Subcutaneous Inject 15-20 Units into the skin 2 (two) times daily. Takes 20 units in the morning and 15 units in the evening    . LOSARTAN POTASSIUM 100 MG PO TABS Oral Take 100 mg by mouth daily.    Marland Kitchen METFORMIN HCL 500 MG PO TABS Oral Take 500 mg by mouth 2 (two) times daily with a meal.    . METOPROLOL TARTRATE 100 MG PO TABS Oral Take 100 mg by mouth 2 (two) times daily.    Marland Kitchen POTASSIUM CHLORIDE CRYS ER 20 MEQ PO TBCR Oral Take 20 mEq by mouth 2 (two) times daily. Due to the size  of this pill, the patient may take by dissolving in a spoonful of water    . SIMVASTATIN 40 MG PO TABS Oral Take 40 mg by mouth every evening.    Marland Kitchen SPIRONOLACTONE 25 MG PO TABS Oral Take 25 mg by mouth daily.      BP 230/108  Resp 28  Ht 5\' 4"  (1.626 m)  Wt 140 lb (63.504 kg)  BMI 24.03 kg/m2  SpO2 100%  Physical Exam 0805: Physical examination:  Nursing notes reviewed; Vital signs and O2 SAT reviewed;  Constitutional: Well developed, Well nourished, Well hydrated, In no acute distress; Head:  Normocephalic, atraumatic; Eyes: EOMI, PERRL, No scleral icterus; ENMT: Mouth and pharynx normal, Mucous membranes moist; Neck: Supple, Full range of motion, No lymphadenopathy; Cardiovascular:  Regular rate and rhythm, No murmur or gallop; Respiratory: Breath sounds coarse & equal bilaterally, No wheezes, Normal respiratory effort/excursion; Chest: Nontender, Movement normal; Abdomen: Soft, Nontender, Nondistended, Normal bowel sounds; Extremities: Pulses normal, No tenderness, No edema, No calf edema or asymmetry.; Neuro: AA&Ox3, Major CN grossly intact. Speech clear, no facial droop. No gross focal motor or sensory deficits in extremities.; Skin: Color normal, Warm, Dry, no rash.   ED Course  Procedures   MDM  MDM Reviewed: previous chart, nursing note and vitals Reviewed previous: ECG and labs Interpretation: ECG, x-ray and labs Consults: admitting MD and cardiology    Date: 08/22/2011  Rate: 93  Rhythm: normal sinus rhythm  QRS Axis: normal  Intervals: normal  ST/T Wave abnormalities: nonspecific ST/T changes  Conduction Disutrbances:none  Narrative Interpretation:   Old EKG Reviewed: unchanged; no significant changes from previous EKG dated 05/17/2011.  Results for orders placed during the hospital encounter of 08/22/11  PRO B NATRIURETIC PEPTIDE      Component Value Range   Pro B Natriuretic peptide (BNP) 5539.0 (*) 0 - 125 (pg/mL)  BASIC METABOLIC PANEL      Component Value Range   Sodium 141  135 - 145 (mEq/L)   Potassium 3.5  3.5 - 5.1 (mEq/L)   Chloride 102  96 - 112 (mEq/L)   CO2 27  19 - 32 (mEq/L)   Glucose, Bld 110 (*) 70 - 99 (mg/dL)   BUN 14  6 - 23 (mg/dL)   Creatinine, Ser 0.76  0.50 - 1.10 (mg/dL)   Calcium 9.1  8.4 - 10.5 (mg/dL)   GFR calc non Af Amer 90 (*) >90 (mL/min)   GFR calc Af Amer >90  >90 (mL/min)  CBC      Component Value Range   WBC 10.8 (*) 4.0 - 10.5 (K/uL)   RBC 4.10  3.87 - 5.11 (MIL/uL)   Hemoglobin 11.8 (*) 12.0 - 15.0 (g/dL)   HCT 35.5 (*) 36.0 - 46.0 (%)   MCV 86.6  78.0 - 100.0 (fL)   MCH 28.8  26.0 - 34.0 (pg)   MCHC 33.2  30.0 - 36.0 (g/dL)   RDW 13.6  11.5 - 15.5 (%)   Platelets 363  150 - 400 (K/uL)    DIFFERENTIAL      Component Value Range   Neutrophils Relative 68  43 - 77 (%)   Neutro Abs 7.3  1.7 - 7.7 (K/uL)   Lymphocytes Relative 25  12 - 46 (%)   Lymphs Abs 2.7  0.7 - 4.0 (K/uL)   Monocytes Relative 5  3 - 12 (%)   Monocytes Absolute 0.5  0.1 - 1.0 (K/uL)   Eosinophils Relative 2  0 - 5 (%)  Eosinophils Absolute 0.2  0.0 - 0.7 (K/uL)   Basophils Relative 0  0 - 1 (%)   Basophils Absolute 0.0  0.0 - 0.1 (K/uL)  URINALYSIS, ROUTINE W REFLEX MICROSCOPIC      Component Value Range   Color, Urine YELLOW  YELLOW    APPearance CLEAR  CLEAR    Specific Gravity, Urine 1.008  1.005 - 1.030    pH 6.5  5.0 - 8.0    Glucose, UA NEGATIVE  NEGATIVE (mg/dL)   Hgb urine dipstick SMALL (*) NEGATIVE    Bilirubin Urine NEGATIVE  NEGATIVE    Ketones, ur NEGATIVE  NEGATIVE (mg/dL)   Protein, ur 100 (*) NEGATIVE (mg/dL)   Urobilinogen, UA 0.2  0.0 - 1.0 (mg/dL)   Nitrite NEGATIVE  NEGATIVE    Leukocytes, UA NEGATIVE  NEGATIVE   POCT I-STAT TROPONIN I      Component Value Range   Troponin i, poc 0.01  0.00 - 0.08 (ng/mL)   Comment 3           URINE MICROSCOPIC-ADD ON      Component Value Range   WBC, UA 0-2  <3 (WBC/hpf)   RBC / HPF 3-6  <3 (RBC/hpf)    Results for HOWARD, MESSNER (MRN [id number]) as of 08/22/2011 11:01  Ref. Range 05/20/2011 06:05 08/22/2011 08:08  Pro B Natriuretic peptide (BNP) Latest Range: 0-125 pg/mL 2924.0 (H) 5539.0 (H)    Dg Chest Port 1 View 08/22/2011  *RADIOLOGY REPORT*  Clinical Data: Evaluate for infiltrate, congestive heart failure  PORTABLE CHEST - 1 VIEW  Comparison: 05/16/2011; 11/30/2010  Findings: Grossly unchanged cardiac silhouette and mediastinal contours.  Overall improved aeration of the bilateral lungs with persistent minimal bibasilar heterogeneous opacities.  Mild pulmonary venous congestion without frank evidence of pulmonary edema.  No pleural effusion or pneumothorax.  Unchanged bones.  IMPRESSION: 1.  Overall improved aeration of the  lungs with persistent bibasilar opacities favored to represent atelectasis or scar. 2.  Mild pulmonary venous congestion without frank evidence of pulmonary edema.  Original Report Authenticated By: Rachel Moulds, M.D.    [phone]:  No overt pulmonary edema on CXR.  Sats maintain 97-100% on R/A and O2 2L N/C.  Denies CP.  Will give IV hydralazine (pt did not take her own this morning) for HTN.   1000:  BNP elevated from previous, will give IV lasix.  IV hydralazine given with SBP improving to 170's.  SOB improved.  Pt continues to deny CP.  Will need admit.  Dx testing d/w pt and family.  Questions answered.  Verb understanding, agreeable to admit.  1010:  T/C to Cards Dr. Marlou Porch (on call for Dr. Irish Lack), case discussed, including:  HPI, pertinent PM/SHx, VS/PE, dx testing, ED course and treatment.  Agreeable with ED treatment.  Requests to admit to Internal Medicine service, Cards can consult if needed.  1045:  T/C to [facility] Dr. Algis Liming, case discussed, including:  HPI, pertinent PM/SHx, VS/PE, dx testing, ED course and treatment.  Agreeable to admit.  Requests to obtain  temporary orders, tele bed to Dr. Glenna Durand service.    [place], DO 08/23/11 2026

## 2011-08-23 LAB — CBC
HCT: 31.1 % — ABNORMAL LOW (ref 36.0–46.0)
Hemoglobin: 10.2 g/dL — ABNORMAL LOW (ref 12.0–15.0)
MCH: 28.3 pg (ref 26.0–34.0)
MCHC: 32.8 g/dL (ref 30.0–36.0)
MCV: 86.1 fL (ref 78.0–100.0)
Platelets: 348 10*3/uL (ref 150–400)
RBC: 3.61 MIL/uL — ABNORMAL LOW (ref 3.87–5.11)
RDW: 13.6 % (ref 11.5–15.5)
WBC: 10.5 10*3/uL (ref 4.0–10.5)

## 2011-08-23 LAB — BASIC METABOLIC PANEL
BUN: 19 mg/dL (ref 6–23)
CO2: 28 mEq/L (ref 19–32)
Calcium: 8.8 mg/dL (ref 8.4–10.5)
Chloride: 101 mEq/L (ref 96–112)
Creatinine, Ser: 0.95 mg/dL (ref 0.50–1.10)
GFR calc Af Amer: 74 mL/min — ABNORMAL LOW (ref >90)
GFR calc non Af Amer: 64 mL/min — ABNORMAL LOW (ref >90)
Glucose, Bld: 145 mg/dL — ABNORMAL HIGH (ref 70–99)
Potassium: 3.8 mEq/L (ref 3.5–5.1)
Sodium: 139 mEq/L (ref 135–145)

## 2011-08-23 LAB — URINE CULTURE
Colony Count: 15000
Culture  Setup Time: 201301261211

## 2011-08-23 LAB — CARDIAC PANEL(CRET KIN+CKTOT+MB+TROPI)
CK, MB: 3.3 ng/mL (ref 0.3–4.0)
CK, MB: 3.4 ng/mL (ref 0.3–4.0)
Relative Index: INVALID (ref 0.0–2.5)
Relative Index: INVALID (ref 0.0–2.5)
Total CK: 66 U/L (ref 7–177)
Total CK: 73 U/L (ref 7–177)
Troponin I: <0.3 ng/mL (ref <0.30)
Troponin I: <0.3 ng/mL (ref <0.30)

## 2011-08-23 LAB — GLUCOSE, CAPILLARY
Glucose-Capillary: 143 mg/dL — ABNORMAL HIGH (ref 70–99)
Glucose-Capillary: 183 mg/dL — ABNORMAL HIGH (ref 70–99)
Glucose-Capillary: 190 mg/dL — ABNORMAL HIGH (ref 70–99)
Glucose-Capillary: 81 mg/dL (ref 70–99)

## 2011-08-23 MED ORDER — HYDRALAZINE HCL 100 MG PO TABS: 100.0000 mg | ORAL_TABLET | Freq: Three times a day (TID) | ORAL | Status: DC

## 2011-08-23 MED ORDER — HYDRALAZINE HCL 50 MG PO TABS
100.0000 mg | ORAL_TABLET | Freq: Three times a day (TID) | ORAL | Status: DC
  Administered 2011-08-23 – 2011-08-24 (×3): 100 mg via ORAL
  Filled 2011-08-23 (×6): qty 2

## 2011-08-23 NOTE — Plan of Care (Signed)
Problem: Phase II Progression Outcomes Goal: Case manager referral Outcome: Completed/Met Date Met:  08/23/11 Pt needs scale for home. Unable to get a scale on Sunday will follow up with Case manager on Monday Delray Alt RN

## 2011-08-23 NOTE — Progress Notes (Signed)
   CARE MANAGEMENT NOTE 08/23/2011  Patient:  Day,[name]   Account Number:  [id number]  Date Initiated:  08/23/2011  Documentation initiated by:  [facility]  Subjective/Objective Assessment:   CHF     Action/Plan:   lives at home with husband and grandson   Anticipated DC Date:     Anticipated DC Plan:  [place]  CM consult      Choice offered to / List presented to:             Status of service:  In process, will continue to follow Medicare Important Message given?   (If response is "NO", the following Medicare IM given date fields will be blank) Date Medicare IM given:   Date Additional Medicare IM given:    Discharge Disposition:    Per UR Regulation:    Comments:  08/23/2011 1500 Pt is requesting a scale. States he has been out of work since May 2012. States she has Living Better with Heart Failure packet. States she is reviewing the information. States she tries to monitor her sodium intake and plans to monitor her fluid intake. Will have HF NCM follow up with pt. Jonnie Finner RN CCM Case Mgmt phone [phone]

## 2011-08-23 NOTE — Progress Notes (Signed)
Subjective: 60yof with phm of HTN, DM and diastolic dysfunction presents with acute shortness of breath over night.  This morning she is doing much better.  No further shortness of breath   Objective: Vital signs in last 24 hours: Temp:  [98.1 F (36.7 C)-98.7 F (37.1 C)] 98.7 F (37.1 C) (01/27 1333) Pulse Rate:  [77-112] 77  (01/27 1333) Resp:  [18-22] 18  (01/27 1333) BP: (155-184)/(74-96) 158/74 mmHg (01/27 1333) SpO2:  [95 %-100 %] 100 % (01/27 1333) Weight:  [64.6 kg (142 lb 6.7 oz)] 64.6 kg (142 lb 6.7 oz) (01/27 0448) Weight change:  Last BM Date: 08/22/11  Intake/Output from previous day: 01/26 0701 - 01/27 0700 In: 1064 [P.O.:860; I.V.:200; IV Piggyback:4] Out: 1125 [Urine:1125] Intake/Output this shift: Total I/O In: 480 [P.O.:480] Out: 900 [Urine:900]  General appearance: alert, cooperative and no distress Resp: clear to auscultation bilaterally Cardio: regular rate and rhythm, S1, S2 normal, no murmur, click, rub or gallop GI: soft, non-tender; bowel sounds normal; no masses,  no organomegaly Extremities: extremities normal, atraumatic, no cyanosis or edema  Lab Results:  Basename 08/23/11 0127 08/22/11 0808  WBC 10.5 10.8*  HGB 10.2* 11.8*  HCT 31.1* 35.5*  PLT 348 363   BMET  Basename 08/23/11 0127 08/22/11 0808  NA 139 141  K 3.8 3.5  CL 101 102  CO2 28 27  GLUCOSE 145* 110*  BUN 19 14  CREATININE 0.95 0.76  CALCIUM 8.8 9.1    Studies/Results: Dg Chest Port 1 View  08/22/2011  *RADIOLOGY REPORT*  Clinical Data: Evaluate for infiltrate, congestive heart failure  PORTABLE CHEST - 1 VIEW  Comparison: 05/16/2011; 11/30/2010  Findings: Grossly unchanged cardiac silhouette and mediastinal contours.  Overall improved aeration of the bilateral lungs with persistent minimal bibasilar heterogeneous opacities.  Mild pulmonary venous congestion without frank evidence of pulmonary edema.  No pleural effusion or pneumothorax.  Unchanged bones.  IMPRESSION:  1.  Overall improved aeration of the lungs with persistent bibasilar opacities favored to represent atelectasis or scar. 2.  Mild pulmonary venous congestion without frank evidence of pulmonary edema.  Original Report Authenticated By: Rachel Moulds, M.D.    Medications:  I have reviewed the patient's current medications. Prior to Admission:  Prescriptions prior to admission  Medication Sig Dispense Refill  . amLODipine (NORVASC) 10 MG tablet Take 10 mg by mouth daily.      Marland Kitchen aspirin 81 MG chewable tablet Chew 81 mg by mouth daily.      . furosemide (LASIX) 40 MG tablet Take 40 mg by mouth 2 (two) times daily.      . hydrALAZINE (APRESOLINE) 50 MG tablet Take 50 mg by mouth 3 (three) times daily.      . Insulin Isophane & Regular (HUMULIN 70/30 [place]) Inject 15-20 Units into the skin 2 (two) times daily. Takes 20 units in the morning and 15 units in the evening      . losartan (COZAAR) 100 MG tablet Take 100 mg by mouth daily.      . metFORMIN (GLUCOPHAGE) 500 MG tablet Take 500 mg by mouth 2 (two) times daily with a meal.      . metoprolol (LOPRESSOR) 100 MG tablet Take 100 mg by mouth 2 (two) times daily.      . potassium chloride SA (K-DUR,KLOR-CON) 20 MEQ tablet Take 20 mEq by mouth 2 (two) times daily. Due to the size of this pill, the patient may take by dissolving in a spoonful of water      .  simvastatin (ZOCOR) 40 MG tablet Take 40 mg by mouth every evening.      Marland Kitchen spironolactone (ALDACTONE) 25 MG tablet Take 25 mg by mouth daily.       Scheduled:   . amLODipine  10 mg Oral Daily  . aspirin  81 mg Oral Daily  . enoxaparin  40 mg Subcutaneous Q24H  . furosemide  40 mg Intravenous BID  . hydrALAZINE  50 mg Oral TID  . insulin aspart  0-5 Units Subcutaneous QHS  . insulin aspart  0-9 Units Subcutaneous TID WC  . insulin aspart protamine-insulin aspart  15 Units Subcutaneous BID WC  . losartan  100 mg Oral Daily  . metFORMIN  500 mg Oral BID WC  . metoprolol  100 mg Oral BID  .  potassium chloride SA  20 mEq Oral BID  . potassium chloride  40 mEq Oral Once  . rosuvastatin  10 mg Oral QPM  . spironolactone  25 mg Oral Daily   Continuous:   . DISCONTD: sodium chloride Stopped (08/22/11 1310)   [id number], acetaminophen, albuterol  Assessment/Plan: 1)  Diastolic CHF, acute on chronic: Elevated BNP and shortness of breath.  Now doing much better after diuresis.  Cardiac enzymes are negative.  Awaiting ECHO report.  Had a long discussion regarding salt intake, blood pressure control. 2) diabetes mellitus: cbg's high in hospital. A1C is 7.4 indicating good control at home. Continue metformin and sliding scale, carb modified diet. 3) HTN (hypertension), malignant: still elevated today in the 160/90's.  Currently asymptomatic. Continue amlodipine, increase hydralazine, continue losartan and spironolactone. 4) dispo: clinically much improved.  Had considered discharge today, but BP still elevated and ECHO pending. Likely dc in am.    LOS: 1 day   WALSH,CATHERINE 08/23/2011, 1:44 PM

## 2011-08-23 NOTE — Plan of Care (Signed)
Problem: Consults Goal: Heart Failure Patient Education (See Patient Education module for education specifics.)  Outcome: Progressing Went over low or no salt added foods , Daily weights using teach back method. Delray Alt RN

## 2011-08-23 NOTE — Progress Notes (Signed)
2D Echo completed.  Alvin Critchley, RDCS

## 2011-08-24 LAB — BASIC METABOLIC PANEL
BUN: 25 mg/dL — ABNORMAL HIGH (ref 6–23)
CO2: 28 mEq/L (ref 19–32)
Calcium: 9.5 mg/dL (ref 8.4–10.5)
Chloride: 100 mEq/L (ref 96–112)
Creatinine, Ser: 0.99 mg/dL (ref 0.50–1.10)
GFR calc Af Amer: 70 mL/min — ABNORMAL LOW (ref >90)
GFR calc non Af Amer: 61 mL/min — ABNORMAL LOW (ref >90)
Glucose, Bld: 130 mg/dL — ABNORMAL HIGH (ref 70–99)
Potassium: 3.9 mEq/L (ref 3.5–5.1)
Sodium: 139 mEq/L (ref 135–145)

## 2011-08-24 LAB — CBC
HCT: 32.4 % — ABNORMAL LOW (ref 36.0–46.0)
Hemoglobin: 10.5 g/dL — ABNORMAL LOW (ref 12.0–15.0)
MCH: 28.1 pg (ref 26.0–34.0)
MCHC: 32.4 g/dL (ref 30.0–36.0)
MCV: 86.6 fL (ref 78.0–100.0)
Platelets: 352 10*3/uL (ref 150–400)
RBC: 3.74 MIL/uL — ABNORMAL LOW (ref 3.87–5.11)
RDW: 13.7 % (ref 11.5–15.5)
WBC: 8.1 10*3/uL (ref 4.0–10.5)

## 2011-08-24 LAB — PRO B NATRIURETIC PEPTIDE: Pro B Natriuretic peptide (BNP): 3814 pg/mL — ABNORMAL HIGH (ref 0–125)

## 2011-08-24 LAB — GLUCOSE, CAPILLARY: Glucose-Capillary: 124 mg/dL — ABNORMAL HIGH (ref 70–99)

## 2011-08-24 NOTE — Progress Notes (Signed)
08/24/11 1400 UR Completed. Llana Aliment, RN, BSN

## 2011-08-24 NOTE — Discharge Summary (Signed)
Physician Discharge Summary  Patient ID: Destiny Day MRN: [id number] DOB/AGE: 03/31/1951 61 y.o.  Admit date: 08/22/2011 Discharge date: 08/24/2011  Admission Diagnoses:  Discharge Diagnoses:  Principal Problem:  *Diastolic CHF, acute on chronic Active Problems:  HTN (hypertension), malignant Type 2 diabetes  Disposition: Home-Health Care Svc   Consults: None  Significant Diagnostic Studies: labs: blood chemistries normal, creatinine 0.9 BUN of 20 sodium 139 count ration 3.8 hemoglobin 10.2 white count 10.5 cardiac markers x3 negative hemoglobin A1c 7.4 initial proBNP 5539, improved to  3000 and radiology: CXR: CHF  Treatments: cardiac meds: metoprolol, norvasc and furosemide  Hospital Course: 61 years old female with a known history of diastolic heart failure, hypertension, diabetes admitted with shortness of breath acute diastolic dysfunction, elevated blood pressure. Acute diastolic CHF, most likely due to uncontrolled hypertension noncompliance and dietary discretion, patient improved with controlling the blood pressure was shortness of breath improved, her BNP improved, blood pressure medication adjusted with increasing the hydralazine to 3 times a day 100 mg, other medications stay the same patient continued on Lasix twice a day. Her blood work including blood chemistries cardiac markers normal. Patient was instructed with low sodium diet, with weighing herself daily. And poor compliance with the medication. Diabetes: A1c 7.4 blood sugar in the could control, continue her insulin and her oral medications. Without any change Hypertension uncontrolled with admission hypertension over systolic of [id number], with medication adjustment and compliance patient will continue on her blood pressure medication. Dyslipidemia continue on her Zocor Follow up in one week  Discharged Condition: improved  Discharge Orders    Future Orders Please Complete By Expires   Diet - low sodium heart  healthy      Diet - low sodium heart healthy      Increase activity slowly      Increase activity slowly        Medication List  As of 08/24/2011  8:27 AM   TAKE these medications         amLODipine 10 MG tablet   Commonly known as: NORVASC   Take 10 mg by mouth daily.      aspirin 81 MG chewable tablet   Chew 81 mg by mouth daily.      furosemide 40 MG tablet   Commonly known as: LASIX   Take 40 mg by mouth 2 (two) times daily.      HUMULIN 70/30 [place]   Inject 15-20 Units into the skin 2 (two) times daily. Takes 20 units in the morning and 15 units in the evening      hydrALAZINE 100 MG tablet   Commonly known as: APRESOLINE   Take 1 tablet (100 mg total) by mouth every 8 (eight) hours.      losartan 100 MG tablet   Commonly known as: COZAAR   Take 100 mg by mouth daily.      metFORMIN 500 MG tablet   Commonly known as: GLUCOPHAGE   Take 500 mg by mouth 2 (two) times daily with a meal.      metoprolol 100 MG tablet   Commonly known as: LOPRESSOR   Take 100 mg by mouth 2 (two) times daily.      potassium chloride SA 20 MEQ tablet   Commonly known as: K-DUR,KLOR-CON   Take 20 mEq by mouth 2 (two) times daily. Due to the size of this pill, the patient may take by dissolving in a spoonful of water      simvastatin 40 MG  tablet   Commonly known as: ZOCOR   Take 40 mg by mouth every evening.      spironolactone 25 MG tablet   Commonly known as: ALDACTONE   Take 25 mg by mouth daily.           Follow-up Information    Follow up with Wenda Low, MD in 1 week.         SignedWenda Low 08/24/2011, 8:27 AM

## 2011-08-24 NOTE — Progress Notes (Signed)
CSW met with patient at bedside and provided support and also provided education on CHF management. Patient had also requested a scale and CSW provided patient with a scale from the gift fund. CSW reviewed all CHF management guidelines with the patient and stressed the importance of adhering to all guidelines. The patient was well educated about CHF management and reported that she was going to change her ways. Patient reported no symptoms of depression and also had no home health needs. Patient reported good support from her husband and children. Clinical Social Worker will sign off for now as social work intervention is no longer needed. Please consult [place] again if new need arises.

## 2011-08-24 NOTE — Progress Notes (Signed)
   CARE MANAGEMENT NOTE 08/24/2011  Patient:  Day,[name]   Account Number:  [id number]  Date Initiated:  08/23/2011  Documentation initiated by:  [facility]  Subjective/Objective Assessment:   CHF     Action/Plan:   lives at home with husband and grandson   Anticipated DC Date:     Anticipated DC Plan:  [place]  CM consult      Choice offered to / List presented to:          [facility] arranged  [place] - 11 Patient Refused      Status of service:  Completed, signed off Medicare Important Message given?  NO (If response is "NO", the following Medicare IM given date fields will be blank) Date Medicare IM given:   Date Additional Medicare IM given:    Discharge Disposition:  HOME/SELF CARE  Per UR Regulation:  Reviewed for med. necessity/level of care/duration of stay  Comments:  08/24/11 1400 UR Completed. Llana Aliment, RN, BSN   08/24/11 1100 HF CSW Amy Stuckey gave pt. a scale.  She also completed HF education with pt.   Pt. refused [place] RN for HF management. Llana Aliment, RN, BSN   08/23/2011 1500 Pt is requesting a scale. States he has been out of work since May 2012. States she has Living Better with Heart Failure packet. States she is reviewing the information. States she tries to monitor her sodium intake and plans to monitor her fluid intake. Will have HF NCM follow up with pt. Jonnie Finner RN CCM Case Mgmt phone [phone]

## 2011-08-24 NOTE — Progress Notes (Signed)
Pt and husband declined to view HF video said they have both seen it before. Pt has HF ed book, and 2 gm NA diet info and meal sample plan. Teach back done on both. Pt now has scales to weigh self daily. Delray Alt RN

## 2011-08-24 NOTE — Progress Notes (Signed)
Pt discharged per W/C per volunteer  after all D/C instruction completed. Pt aware of follow up MD appt and home meds. Prescription given. Pt has all belongings.

## 2011-09-11 ENCOUNTER — Emergency Department (HOSPITAL_COMMUNITY)
Admission: EM | Admit: 2011-09-11 | Discharge: 2011-09-11 | Disposition: A | Discharge status: Home / Self Care | Payer: Self-pay | Attending: Emergency Medicine | Admitting: Emergency Medicine

## 2011-09-11 ENCOUNTER — Encounter (HOSPITAL_COMMUNITY): Payer: Self-pay

## 2011-09-11 ENCOUNTER — Emergency Department (HOSPITAL_COMMUNITY): Payer: Self-pay

## 2011-09-11 DIAGNOSIS — S92909A Unspecified fracture of unspecified foot, initial encounter for closed fracture: Secondary | ICD-10-CM

## 2011-09-11 DIAGNOSIS — S92309A Fracture of unspecified metatarsal bone(s), unspecified foot, initial encounter for closed fracture: Secondary | ICD-10-CM | POA: Insufficient documentation

## 2011-09-11 DIAGNOSIS — S60221A Contusion of right hand, initial encounter: Secondary | ICD-10-CM

## 2011-09-11 DIAGNOSIS — I509 Heart failure, unspecified: Secondary | ICD-10-CM | POA: Insufficient documentation

## 2011-09-11 DIAGNOSIS — I10 Essential (primary) hypertension: Secondary | ICD-10-CM | POA: Insufficient documentation

## 2011-09-11 DIAGNOSIS — M79609 Pain in unspecified limb: Secondary | ICD-10-CM | POA: Insufficient documentation

## 2011-09-11 DIAGNOSIS — Z7982 Long term (current) use of aspirin: Secondary | ICD-10-CM | POA: Insufficient documentation

## 2011-09-11 DIAGNOSIS — E119 Type 2 diabetes mellitus without complications: Secondary | ICD-10-CM | POA: Insufficient documentation

## 2011-09-11 DIAGNOSIS — S60229A Contusion of unspecified hand, initial encounter: Secondary | ICD-10-CM | POA: Insufficient documentation

## 2011-09-11 DIAGNOSIS — W010XXA Fall on same level from slipping, tripping and stumbling without subsequent striking against object, initial encounter: Secondary | ICD-10-CM | POA: Insufficient documentation

## 2011-09-11 DIAGNOSIS — Z794 Long term (current) use of insulin: Secondary | ICD-10-CM | POA: Insufficient documentation

## 2011-09-11 DIAGNOSIS — E785 Hyperlipidemia, unspecified: Secondary | ICD-10-CM | POA: Insufficient documentation

## 2011-09-11 DIAGNOSIS — M7989 Other specified soft tissue disorders: Secondary | ICD-10-CM | POA: Insufficient documentation

## 2011-09-11 DIAGNOSIS — Z79899 Other long term (current) drug therapy: Secondary | ICD-10-CM | POA: Insufficient documentation

## 2011-09-11 DIAGNOSIS — Y9289 Other specified places as the place of occurrence of the external cause: Secondary | ICD-10-CM | POA: Insufficient documentation

## 2011-09-11 MED ORDER — HYDROCODONE-ACETAMINOPHEN 5-325 MG PO TABS: 1.0000 | ORAL_TABLET | Freq: Four times a day (QID) | ORAL | Status: AC | PRN

## 2011-09-11 MED ORDER — TETANUS-DIPHTH-ACELL PERTUSSIS 5-2.5-18.5 LF-MCG/0.5 IM SUSP
0.5000 mL | Freq: Once | INTRAMUSCULAR | Status: AC
  Administered 2011-09-11: 0.5 mL via INTRAMUSCULAR
  Filled 2011-09-11: qty 0.5

## 2011-09-11 NOTE — Progress Notes (Signed)
Orthopedic Tech Progress Note Patient Details:  Destiny Day 02-08-1951 [id number]  Other Ortho Devices Type of Ortho Device: Postop boot;Crutches Ortho Device Location: (L) LE Ortho Device Interventions: Application;Ordered   Braulio Bosch 09/11/2011, 6:18 PM

## 2011-09-11 NOTE — ED Provider Notes (Signed)
History     CSN: [id number]  Arrival date & time 09/11/11  1532   First MD Initiated Contact with Patient 09/11/11 1717      Chief Complaint  Patient presents with  . Fall    (Consider location/radiation/quality/duration/timing/severity/associated sxs/prior treatment) Patient is a 61 y.o. female presenting with fall. The history is provided by the patient (Patient states that she was walking in a parking lot at [facility] and tried to avoid someone standing there and slipped and fell she hurt her left foot in her right hand.). No language interpreter was used.  Fall The accident occurred 3 to 5 hours ago. The fall occurred while walking. She fell from a height of 1 to 2 ft. She landed on concrete. The volume of blood lost was minimal. Point of impact: Right hand and left foot. The pain is at a severity of 3/10. The pain is moderate. She was ambulatory at the scene. There was no drug use involved in the accident. Pertinent negatives include no abdominal pain, no hematuria and no headaches. The symptoms are aggravated by activity. She has tried nothing for the symptoms.    Past Medical History  Diagnosis Date  . CHF (congestive heart failure)   . Diabetes mellitus   . Hypertension   . Diverticulitis   . Hyperlipidemia     Past Surgical History  Procedure Date  . Abdominal hysterectomy     History reviewed. No pertinent family history.  History  Substance Use Topics  . Smoking status: Former Research scientist (life sciences)  . Smokeless tobacco: Not on file  . Alcohol Use: No    OB History    Grav Para Term Preterm Abortions TAB SAB Ect Mult Living                  Review of Systems  Constitutional: Negative for fatigue.  HENT: Negative for congestion, sinus pressure and ear discharge.   Eyes: Negative for discharge.  Respiratory: Negative for cough.   Cardiovascular: Negative for chest pain.  Gastrointestinal: Negative for abdominal pain and diarrhea.  Genitourinary: Negative for  frequency and hematuria.  Musculoskeletal: Negative for back pain.       Pain in right hand in left foot  Skin: Negative for rash.  Neurological: Negative for seizures and headaches.  Hematological: Negative.   Psychiatric/Behavioral: Negative for hallucinations.    Allergies  Lisinopril  Home Medications   Current Outpatient Rx  Name Route Sig Dispense Refill  . AMLODIPINE BESYLATE 10 MG PO TABS Oral Take 10 mg by mouth daily.    . ASPIRIN 81 MG PO CHEW Oral Chew 81 mg by mouth daily.    . FUROSEMIDE 40 MG PO TABS Oral Take 40 mg by mouth 2 (two) times daily.    Marland Kitchen HYDRALAZINE HCL 100 MG PO TABS Oral Take 100 mg by mouth every 8 (eight) hours.    Marland Kitchen HUMULIN 70/30 [place] Subcutaneous Inject 15-20 Units into the skin 2 (two) times daily. Takes 20 units in the morning and 15 units in the evening    . LOSARTAN POTASSIUM 100 MG PO TABS Oral Take 100 mg by mouth daily.    Marland Kitchen METFORMIN HCL 500 MG PO TABS Oral Take 500 mg by mouth 2 (two) times daily with a meal.    . METOPROLOL TARTRATE 100 MG PO TABS Oral Take 100 mg by mouth 2 (two) times daily.    Marland Kitchen POTASSIUM CHLORIDE CRYS ER 20 MEQ PO TBCR Oral Take 20 mEq by mouth 2 (  two) times daily. Due to the size of this pill, the patient may take by dissolving in a spoonful of water    . SIMVASTATIN 40 MG PO TABS Oral Take 40 mg by mouth every evening.    Marland Kitchen SPIRONOLACTONE 25 MG PO TABS Oral Take 25 mg by mouth daily.    Marland Kitchen HYDROCODONE-ACETAMINOPHEN 5-325 MG PO TABS Oral Take 1 tablet by mouth every 6 (six) hours as needed for pain. 20 tablet 0    BP 174/80  Pulse 78  Temp(Src) 99 F (37.2 C) (Oral)  Resp 18  Physical Exam  Constitutional: She is oriented to person, place, and time. She appears well-developed.  HENT:  Head: Normocephalic and atraumatic.  Eyes: Conjunctivae and EOM are normal. No scleral icterus.  Neck: Neck supple. No thyromegaly present.  Cardiovascular: Normal rate and regular rhythm.  Exam reveals no gallop and no friction  rub.   No murmur heard. Pulmonary/Chest: No stridor. She has no wheezes. She has no rales. She exhibits no tenderness.  Abdominal: She exhibits no distension. There is no tenderness. There is no rebound.  Musculoskeletal: She exhibits no edema.       Mild tenderness to dorsum of right hand no swelling. Left foot has swelling laterally near the fifth metatarsal. Neurovascular exam normal on both extremities  Lymphadenopathy:    She has no cervical adenopathy.  Neurological: She is oriented to person, place, and time. Coordination normal.  Skin: No rash noted. No erythema.  Psychiatric: She has a normal mood and affect. Her behavior is normal.    ED Course  Procedures (including critical care time)  Labs Reviewed - No data to display Dg Hand Complete Right  09/11/2011  *RADIOLOGY REPORT*  Clinical Data: Fall, pain  RIGHT HAND - COMPLETE 3+ VIEW  Comparison:  None.  Findings:  There is no evidence of fracture or dislocation.  There is no evidence of arthropathy or other focal bone abnormality. Soft tissues are unremarkable.  IMPRESSION: Negative.  Original Report Authenticated By: Staci Righter, M.D.   Dg Foot Complete Left  09/11/2011  *RADIOLOGY REPORT*  Clinical Data: Fall, pain  LEFT FOOT - COMPLETE 3+ VIEW  Comparison: None.  Findings: Slightly displaced acute fracture base of fifth metatarsal, with soft tissue swelling.  Achilles and plantar spurs. Vascular calcification consistent with diabetes.  IMPRESSION: Slightly displaced fracture base of fifth metatarsal, with mild soft tissue swelling.  Original Report Authenticated By: Staci Righter, M.D.     1. Foot fracture   2. Contusion of right hand       MDM          Maudry Diego, MD 09/11/11 1754

## 2011-09-11 NOTE — ED Notes (Signed)
Ortho paged. 

## 2011-09-11 NOTE — ED Notes (Signed)
Pt was walking and tripped while walking on a sidewalk. She is having right hand pain and swelling and left foot pain and swelling. No meds pta. Alert and oriented. Pt has small abrasion to the left hand.

## 2011-10-05 ENCOUNTER — Emergency Department (HOSPITAL_COMMUNITY): Payer: BC Managed Care – PPO

## 2011-10-05 ENCOUNTER — Emergency Department (HOSPITAL_COMMUNITY)
Admission: EM | Admit: 2011-10-05 | Discharge: 2011-10-05 | Disposition: A | Discharge status: Home / Self Care | Payer: BC Managed Care – PPO | Attending: Emergency Medicine | Admitting: Emergency Medicine

## 2011-10-05 ENCOUNTER — Encounter (HOSPITAL_COMMUNITY): Payer: Self-pay | Admitting: *Deleted

## 2011-10-05 ENCOUNTER — Other Ambulatory Visit: Payer: Self-pay

## 2011-10-05 DIAGNOSIS — R1110 Vomiting, unspecified: Secondary | ICD-10-CM | POA: Insufficient documentation

## 2011-10-05 DIAGNOSIS — J40 Bronchitis, not specified as acute or chronic: Secondary | ICD-10-CM | POA: Insufficient documentation

## 2011-10-05 DIAGNOSIS — E785 Hyperlipidemia, unspecified: Secondary | ICD-10-CM | POA: Insufficient documentation

## 2011-10-05 DIAGNOSIS — R062 Wheezing: Secondary | ICD-10-CM | POA: Insufficient documentation

## 2011-10-05 DIAGNOSIS — R059 Cough, unspecified: Secondary | ICD-10-CM | POA: Insufficient documentation

## 2011-10-05 DIAGNOSIS — R05 Cough: Secondary | ICD-10-CM | POA: Insufficient documentation

## 2011-10-05 DIAGNOSIS — E119 Type 2 diabetes mellitus without complications: Secondary | ICD-10-CM | POA: Insufficient documentation

## 2011-10-05 DIAGNOSIS — I10 Essential (primary) hypertension: Secondary | ICD-10-CM | POA: Insufficient documentation

## 2011-10-05 DIAGNOSIS — Z794 Long term (current) use of insulin: Secondary | ICD-10-CM | POA: Insufficient documentation

## 2011-10-05 DIAGNOSIS — Z79899 Other long term (current) drug therapy: Secondary | ICD-10-CM | POA: Insufficient documentation

## 2011-10-05 LAB — DIFFERENTIAL
Basophils Absolute: 0 10*3/uL (ref 0.0–0.1)
Basophils Relative: 0 % (ref 0–1)
Eosinophils Absolute: 0 10*3/uL (ref 0.0–0.7)
Eosinophils Relative: 0 % (ref 0–5)
Lymphocytes Relative: 20 % (ref 12–46)
Lymphs Abs: 1.9 10*3/uL (ref 0.7–4.0)
Monocytes Absolute: 0.5 10*3/uL (ref 0.1–1.0)
Monocytes Relative: 5 % (ref 3–12)
Neutro Abs: 7.1 10*3/uL (ref 1.7–7.7)
Neutrophils Relative %: 75 % (ref 43–77)

## 2011-10-05 LAB — BASIC METABOLIC PANEL
BUN: 21 mg/dL (ref 6–23)
CO2: 26 mEq/L (ref 19–32)
Calcium: 8.9 mg/dL (ref 8.4–10.5)
Chloride: 93 mEq/L — ABNORMAL LOW (ref 96–112)
Creatinine, Ser: 1.12 mg/dL — ABNORMAL HIGH (ref 0.50–1.10)
GFR calc Af Amer: 61 mL/min — ABNORMAL LOW (ref >90)
GFR calc non Af Amer: 52 mL/min — ABNORMAL LOW (ref >90)
Glucose, Bld: 171 mg/dL — ABNORMAL HIGH (ref 70–99)
Potassium: 3.9 mEq/L (ref 3.5–5.1)
Sodium: 130 mEq/L — ABNORMAL LOW (ref 135–145)

## 2011-10-05 LAB — CBC
HCT: 29.8 % — ABNORMAL LOW (ref 36.0–46.0)
Hemoglobin: 10.1 g/dL — ABNORMAL LOW (ref 12.0–15.0)
MCH: 28.6 pg (ref 26.0–34.0)
MCHC: 33.9 g/dL (ref 30.0–36.0)
MCV: 84.4 fL (ref 78.0–100.0)
Platelets: 323 10*3/uL (ref 150–400)
RBC: 3.53 MIL/uL — ABNORMAL LOW (ref 3.87–5.11)
RDW: 13.2 % (ref 11.5–15.5)
WBC: 9.5 10*3/uL (ref 4.0–10.5)

## 2011-10-05 LAB — PRO B NATRIURETIC PEPTIDE: Pro B Natriuretic peptide (BNP): 8370 pg/mL — ABNORMAL HIGH (ref 0–125)

## 2011-10-05 MED ORDER — ALBUTEROL SULFATE (5 MG/ML) 0.5% IN NEBU
5.0000 mg | INHALATION_SOLUTION | Freq: Once | RESPIRATORY_TRACT | Status: AC
  Administered 2011-10-05: 5 mg via RESPIRATORY_TRACT
  Filled 2011-10-05: qty 1

## 2011-10-05 MED ORDER — ACETAMINOPHEN 325 MG PO TABS
650.0000 mg | ORAL_TABLET | Freq: Once | ORAL | Status: AC
  Administered 2011-10-05: 650 mg via ORAL
  Filled 2011-10-05: qty 2

## 2011-10-05 MED ORDER — MOXIFLOXACIN HCL IN NACL 400 MG/250ML IV SOLN
400.0000 mg | Freq: Once | INTRAVENOUS | Status: AC
  Administered 2011-10-05: 400 mg via INTRAVENOUS
  Filled 2011-10-05: qty 250

## 2011-10-05 MED ORDER — ONDANSETRON HCL 4 MG/2ML IJ SOLN
4.0000 mg | Freq: Once | INTRAMUSCULAR | Status: DC
  Filled 2011-10-05: qty 2

## 2011-10-05 MED ORDER — IPRATROPIUM BROMIDE 0.02 % IN SOLN
0.5000 mg | Freq: Once | RESPIRATORY_TRACT | Status: AC
  Administered 2011-10-05: 0.5 mg via RESPIRATORY_TRACT
  Filled 2011-10-05: qty 2.5

## 2011-10-05 MED ORDER — MOXIFLOXACIN HCL 400 MG PO TABS: 400.0000 mg | ORAL_TABLET | Freq: Every day | ORAL | Status: AC

## 2011-10-05 NOTE — ED Provider Notes (Signed)
History     CSN: [id number]  Arrival date & time 10/05/11  70   First MD Initiated Contact with Patient 10/05/11 1808      Chief Complaint  Patient presents with  . Emesis    reports vomiting x's 5 on yesterday.   . Cough    pt reports cough starting on saturday reports vomiting started yesterday. pt c/o aches all over.     (Consider location/radiation/quality/duration/timing/severity/associated sxs/prior treatment) HPI  Patient presents to ER complaining of a 3 day hx of gradual onset productive cough and chills with acute onset vomiting x 5 times after taking PO cough syrup yesterday but denies abdominal pain or vomiting today. Patient states she was hospitalized in ER in January for CHF exacerbation and is followed by Dr. Loralie Champagne. Patient states that 3 days ago she felt hot and chilled and then began to have productive cough with going chills and productive cough and therefore took a cough syrup yesterday without relief of symptoms. She has taken no OTC antipyretics or pain medication PTA. Denies associated CP or hemoptysis. Patient denies abdominal pain, diarrhea, dysuria, hematuria or blood in stool. She denies aggravating or alleviating factors. Patient states cough is associated with SOB. Patient has no recorded known fever but felt "feverish." patient states SOB is mostly when coughing stating that " I dont feel like I have fluid on me" denying peripheral edema, SOB at rest, PND, orthopnea or weight gain stating she monitors her weight daily without weight increase.   Past Medical History  Diagnosis Date  . CHF (congestive heart failure)   . Diabetes mellitus   . Hypertension   . Diverticulitis   . Hyperlipidemia     Past Surgical History  Procedure Date  . Abdominal hysterectomy     History reviewed. No pertinent family history.  History  Substance Use Topics  . Smoking status: Former Research scientist (life sciences)  . Smokeless tobacco: Not on file  . Alcohol Use: No    OB History      Grav Para Term Preterm Abortions TAB SAB Ect Mult Living                  Review of Systems  All other systems reviewed and are negative.    Allergies  Lisinopril  Home Medications   Current Outpatient Rx  Name Route Sig Dispense Refill  . ACETAMINOPHEN 500 MG PO TABS Oral Take 500 mg by mouth every 6 (six) hours as needed. For pain/fever    . AMLODIPINE BESYLATE 10 MG PO TABS Oral Take 10 mg by mouth daily.    . ASPIRIN 81 MG PO CHEW Oral Chew 81 mg by mouth daily.    . FUROSEMIDE 40 MG PO TABS Oral Take 40 mg by mouth 2 (two) times daily.    Marland Kitchen HYDRALAZINE HCL 100 MG PO TABS Oral Take 100 mg by mouth every 8 (eight) hours.    Marland Kitchen HUMULIN 70/30 [place] Subcutaneous Inject 15-20 Units into the skin 2 (two) times daily. Takes 20 units in the morning and 15 units in the evening    . LOSARTAN POTASSIUM 100 MG PO TABS Oral Take 100 mg by mouth daily.    Marland Kitchen METFORMIN HCL 500 MG PO TABS Oral Take 500 mg by mouth 2 (two) times daily with a meal.    . METOPROLOL TARTRATE 100 MG PO TABS Oral Take 100 mg by mouth 2 (two) times daily.    Marland Kitchen POTASSIUM CHLORIDE CRYS ER 20 MEQ PO TBCR Oral  Take 20 mEq by mouth 2 (two) times daily. Due to the size of this pill, the patient may take by dissolving in a spoonful of water    . SIMVASTATIN 40 MG PO TABS Oral Take 40 mg by mouth every evening.    Marland Kitchen SPIRONOLACTONE 25 MG PO TABS Oral Take 25 mg by mouth daily.      BP 159/64  Pulse 102  Temp(Src) 100.2 F (37.9 C) (Oral)  Resp 20  SpO2 95%  Physical Exam  Nursing note and vitals reviewed. Constitutional: She is oriented to person, place, and time. She appears well-developed and well-nourished. No distress.  HENT:  Head: Normocephalic and atraumatic.  Eyes: Conjunctivae are normal.  Neck: Normal range of motion. Neck supple.  Cardiovascular: Normal rate, regular rhythm, normal heart sounds and intact distal pulses.  Exam reveals no gallop and no friction rub.   No murmur heard. Pulmonary/Chest:  Effort normal. No respiratory distress. She has wheezes. She has rales. She exhibits no tenderness.       Faint bilateral crackles and exp wheezing.   Abdominal: Soft. Bowel sounds are normal. She exhibits no distension and no mass. There is no tenderness. There is no rebound and no guarding.  Musculoskeletal: Normal range of motion. She exhibits no edema and no tenderness.  Neurological: She is alert and oriented to person, place, and time.  Skin: Skin is warm and dry. No rash noted. She is not diaphoretic. No erythema.  Psychiatric: She has a normal mood and affect.    ED Course  Procedures (including critical care time)  Neb albuterol/atrovent  Labs Reviewed  CBC - Abnormal; Notable for the following:    RBC 3.53 (*)    Hemoglobin 10.1 (*)    HCT 29.8 (*)    All other components within normal limits  BASIC METABOLIC PANEL - Abnormal; Notable for the following:    Sodium 130 (*)    Chloride 93 (*)    Glucose, Bld 171 (*)    Creatinine, Ser 1.12 (*)    GFR calc non Af Amer 52 (*)    GFR calc Af Amer 61 (*)    All other components within normal limits  PRO B NATRIURETIC PEPTIDE - Abnormal; Notable for the following:    Pro B Natriuretic peptide (BNP) 8370.0 (*)    All other components within normal limits  DIFFERENTIAL   Dg Chest 2 View  10/05/2011  *RADIOLOGY REPORT*  Clinical Data: Cough  CHEST - 2 VIEW  Comparison: 08/22/2011  Findings: Mild cardiomegaly.  Vascular congestion.  Improved bibasilar opacities.  No pneumothorax.  Stable bony framework.  No pleural effusion.  IMPRESSION: Improving bibasilar pulmonary opacities.  Original Report Authenticated By: Jamas Lav, M.D.     1. Bronchitis       MDM  Patient without clinical evidence of CHF with no fluid overload on cxr with improving chest xray. Question bronchitis with fever but no respiratory distress and no evidence of PNA on xray. VSS. Pulse ox >95% on room air. Patient is agreeable to taking oral abx at home  and close follow up with PCP but returning to ER for changing or worsening of symptoms.         Eben Burow, [place] 10/05/11 2031

## 2011-10-05 NOTE — ED Notes (Signed)
Pt. Discharge to home with husband via w/c, pt. Alert and oriented, NAD noted, respirations, even and regular

## 2011-10-05 NOTE — Discharge Instructions (Signed)
Take antibiotic in full course and stay well hydrated. Use at home needed inhaler for cough. Call your primary care provider and/or cardiologist tomorrow for close follow up appointment but return to ER for changing or worsening of symptoms.   Bronchitis Bronchitis is the body's way of reacting to injury and/or infection (inflammation) of the bronchi. Bronchi are the air tubes that extend from the windpipe into the lungs. If the inflammation becomes severe, it may cause shortness of breath. CAUSES  Inflammation may be caused by:  A virus.   Germs (bacteria).   Dust.   Allergens.   Pollutants and many other irritants.  The cells lining the bronchial tree are covered with tiny hairs (cilia). These constantly beat upward, away from the lungs, toward the mouth. This keeps the lungs free of pollutants. When these cells become too irritated and are unable to do their job, mucus begins to develop. This causes the characteristic cough of bronchitis. The cough clears the lungs when the cilia are unable to do their job. Without either of these protective mechanisms, the mucus would settle in the lungs. Then you would develop pneumonia. Smoking is a common cause of bronchitis and can contribute to pneumonia. Stopping this habit is the single most important thing you can do to help yourself. TREATMENT   Your caregiver may prescribe an antibiotic if the cough is caused by bacteria. Also, medicines that open up your airways make it easier to breathe. Your caregiver may also recommend or prescribe an expectorant. It will loosen the mucus to be coughed up. Only take over-the-counter or prescription medicines for pain, discomfort, or fever as directed by your caregiver.   Removing whatever causes the problem (smoking, for example) is critical to preventing the problem from getting worse.   Cough suppressants may be prescribed for relief of cough symptoms.   Inhaled medicines may be prescribed to help with  symptoms now and to help prevent problems from returning.   For those with recurrent (chronic) bronchitis, there may be a need for steroid medicines.  SEEK IMMEDIATE MEDICAL CARE IF:   During treatment, you develop more pus-like mucus (purulent sputum).   You have a fever.   Your baby is older than 3 months with a rectal temperature of 102 F (38.9 C) or higher.   Your baby is 72 months old or younger with a rectal temperature of 100.4 F (38 C) or higher.   You become progressively more ill.   You have increased difficulty breathing, wheezing, or shortness of breath.  It is necessary to seek immediate medical care if you are elderly or sick from any other disease. MAKE SURE YOU:   Understand these instructions.   Will watch your condition.   Will get help right away if you are not doing well or get worse.  Document Released: 07/13/2005 Document Revised: 07/02/2011 Document Reviewed: 05/22/2008 [facility] Patient Information 2012 [place].

## 2011-10-06 LAB — GLUCOSE, CAPILLARY: Glucose-Capillary: 163 mg/dL — ABNORMAL HIGH (ref 70–99)

## 2011-10-06 NOTE — ED Provider Notes (Signed)
Medical screening examination/treatment/procedure(s) were performed by non-physician practitioner and as supervising physician I was immediately available for consultation/collaboration.  Richarda Blade, MD 10/06/11 1302

## 2011-10-07 ENCOUNTER — Emergency Department (HOSPITAL_COMMUNITY)
Admission: EM | Admit: 2011-10-07 | Discharge: 2011-10-07 | Disposition: A | Discharge status: Home Health | Payer: BC Managed Care – PPO | Attending: Emergency Medicine | Admitting: Emergency Medicine

## 2011-10-07 ENCOUNTER — Emergency Department (HOSPITAL_COMMUNITY): Payer: BC Managed Care – PPO

## 2011-10-07 ENCOUNTER — Encounter (HOSPITAL_COMMUNITY): Payer: Self-pay | Admitting: *Deleted

## 2011-10-07 DIAGNOSIS — IMO0001 Reserved for inherently not codable concepts without codable children: Secondary | ICD-10-CM | POA: Insufficient documentation

## 2011-10-07 DIAGNOSIS — R059 Cough, unspecified: Secondary | ICD-10-CM | POA: Insufficient documentation

## 2011-10-07 DIAGNOSIS — Z79899 Other long term (current) drug therapy: Secondary | ICD-10-CM | POA: Insufficient documentation

## 2011-10-07 DIAGNOSIS — R0602 Shortness of breath: Secondary | ICD-10-CM | POA: Insufficient documentation

## 2011-10-07 DIAGNOSIS — R071 Chest pain on breathing: Secondary | ICD-10-CM | POA: Insufficient documentation

## 2011-10-07 DIAGNOSIS — I10 Essential (primary) hypertension: Secondary | ICD-10-CM | POA: Insufficient documentation

## 2011-10-07 DIAGNOSIS — R5381 Other malaise: Secondary | ICD-10-CM | POA: Insufficient documentation

## 2011-10-07 DIAGNOSIS — I509 Heart failure, unspecified: Secondary | ICD-10-CM | POA: Insufficient documentation

## 2011-10-07 DIAGNOSIS — R5383 Other fatigue: Secondary | ICD-10-CM | POA: Insufficient documentation

## 2011-10-07 DIAGNOSIS — E119 Type 2 diabetes mellitus without complications: Secondary | ICD-10-CM | POA: Insufficient documentation

## 2011-10-07 DIAGNOSIS — R0989 Other specified symptoms and signs involving the circulatory and respiratory systems: Secondary | ICD-10-CM | POA: Insufficient documentation

## 2011-10-07 DIAGNOSIS — Z794 Long term (current) use of insulin: Secondary | ICD-10-CM | POA: Insufficient documentation

## 2011-10-07 DIAGNOSIS — J3489 Other specified disorders of nose and nasal sinuses: Secondary | ICD-10-CM | POA: Insufficient documentation

## 2011-10-07 DIAGNOSIS — R509 Fever, unspecified: Secondary | ICD-10-CM | POA: Insufficient documentation

## 2011-10-07 DIAGNOSIS — R05 Cough: Secondary | ICD-10-CM | POA: Insufficient documentation

## 2011-10-07 DIAGNOSIS — E785 Hyperlipidemia, unspecified: Secondary | ICD-10-CM | POA: Insufficient documentation

## 2011-10-07 DIAGNOSIS — J189 Pneumonia, unspecified organism: Secondary | ICD-10-CM | POA: Insufficient documentation

## 2011-10-07 LAB — DIFFERENTIAL
Basophils Absolute: 0 10*3/uL (ref 0.0–0.1)
Basophils Relative: 0 % (ref 0–1)
Eosinophils Absolute: 0 10*3/uL (ref 0.0–0.7)
Eosinophils Relative: 0 % (ref 0–5)
Lymphocytes Relative: 16 % (ref 12–46)
Lymphs Abs: 1.4 10*3/uL (ref 0.7–4.0)
Monocytes Absolute: 0.3 10*3/uL (ref 0.1–1.0)
Monocytes Relative: 3 % (ref 3–12)
Neutro Abs: 7.2 10*3/uL (ref 1.7–7.7)
Neutrophils Relative %: 80 % — ABNORMAL HIGH (ref 43–77)

## 2011-10-07 LAB — BASIC METABOLIC PANEL
BUN: 24 mg/dL — ABNORMAL HIGH (ref 6–23)
CO2: 25 mEq/L (ref 19–32)
Calcium: 8.6 mg/dL (ref 8.4–10.5)
Chloride: 92 mEq/L — ABNORMAL LOW (ref 96–112)
Creatinine, Ser: 1.27 mg/dL — ABNORMAL HIGH (ref 0.50–1.10)
GFR calc Af Amer: 52 mL/min — ABNORMAL LOW (ref >90)
GFR calc non Af Amer: 45 mL/min — ABNORMAL LOW (ref >90)
Glucose, Bld: 91 mg/dL (ref 70–99)
Potassium: 3.9 mEq/L (ref 3.5–5.1)
Sodium: 128 mEq/L — ABNORMAL LOW (ref 135–145)

## 2011-10-07 LAB — CBC
HCT: 30.2 % — ABNORMAL LOW (ref 36.0–46.0)
Hemoglobin: 10.3 g/dL — ABNORMAL LOW (ref 12.0–15.0)
MCH: 28.6 pg (ref 26.0–34.0)
MCHC: 34.1 g/dL (ref 30.0–36.0)
MCV: 83.9 fL (ref 78.0–100.0)
Platelets: 290 10*3/uL (ref 150–400)
RBC: 3.6 MIL/uL — ABNORMAL LOW (ref 3.87–5.11)
RDW: 12.9 % (ref 11.5–15.5)
WBC: 9 10*3/uL (ref 4.0–10.5)

## 2011-10-07 MED ORDER — ACETAMINOPHEN 325 MG PO TABS
650.0000 mg | ORAL_TABLET | Freq: Once | ORAL | Status: AC
  Administered 2011-10-07: 650 mg via ORAL
  Filled 2011-10-07: qty 2

## 2011-10-07 MED ORDER — SODIUM CHLORIDE 0.9 % IV BOLUS (SEPSIS)
1000.0000 mL | Freq: Once | INTRAVENOUS | Status: AC
  Administered 2011-10-07: 1000 mL via INTRAVENOUS

## 2011-10-07 MED ORDER — IBUPROFEN 800 MG PO TABS
800.0000 mg | ORAL_TABLET | Freq: Once | ORAL | Status: AC
  Administered 2011-10-07: 800 mg via ORAL
  Filled 2011-10-07: qty 1

## 2011-10-07 MED ORDER — HYDROCOD POLST-CHLORPHEN POLST 10-8 MG/5ML PO LQCR
5.0000 mL | Freq: Once | ORAL | Status: AC
  Administered 2011-10-07: 5 mL via ORAL
  Filled 2011-10-07: qty 5

## 2011-10-07 MED ORDER — HYDROCOD POLST-CHLORPHEN POLST 10-8 MG/5ML PO LQCR: 5.0000 mL | Freq: Two times a day (BID) | ORAL | Status: DC

## 2011-10-07 NOTE — Discharge Instructions (Signed)
Please continue to take your antibiotics as prescribed.  Drink plenty of fluids to stay hydrated.  Please follow up with a primary care provider.  Return to the emergency room for worsening shortness of breath.  Take Tylenol and or Ibuprofen for fever.   Pneumonia, Adult Pneumonia is an infection of the lungs.  CAUSES Pneumonia may be caused by bacteria or a virus. Usually, these infections are caused by breathing infectious particles into the lungs (respiratory tract). SYMPTOMS   Cough.   Fever.   Chest pain.   Increased rate of breathing.   Wheezing.   Mucus production.  DIAGNOSIS  If you have the common symptoms of pneumonia, your caregiver will typically confirm the diagnosis with a chest X-ray. The X-ray will show an abnormality in the lung (pulmonary infiltrate) if you have pneumonia. Other tests of your blood, urine, or sputum may be done to find the specific cause of your pneumonia. Your caregiver may also do tests (blood gases or pulse oximetry) to see how well your lungs are working. TREATMENT  Some forms of pneumonia may be spread to other people when you cough or sneeze. You may be asked to wear a mask before and during your exam. Pneumonia that is caused by bacteria is treated with antibiotic medicine. Pneumonia that is caused by the influenza virus may be treated with an antiviral medicine. Most other viral infections must run their course. These infections will not respond to antibiotics.  PREVENTION A pneumococcal shot (vaccine) is available to prevent a common bacterial cause of pneumonia. This is usually suggested for:  People over 78 years old.   Patients on chemotherapy.   People with chronic lung problems, such as bronchitis or emphysema.   People with immune system problems.  If you are over 65 or have a high risk condition, you may receive the pneumococcal vaccine if you have not received it before. In some countries, a routine influenza vaccine is also  recommended. This vaccine can help prevent some cases of pneumonia.You may be offered the influenza vaccine as part of your care. If you smoke, it is time to quit. You may receive instructions on how to stop smoking. Your caregiver can provide medicines and counseling to help you quit. HOME CARE INSTRUCTIONS   Cough suppressants may be used if you are losing too much rest. However, coughing protects you by clearing your lungs. You should avoid using cough suppressants if you can.   Your caregiver may have prescribed medicine if he or she thinks your pneumonia is caused by a bacteria or influenza. Finish your medicine even if you start to feel better.   Your caregiver may also prescribe an expectorant. This loosens the mucus to be coughed up.   Only take over-the-counter or prescription medicines for pain, discomfort, or fever as directed by your caregiver.   Do not smoke. Smoking is a common cause of bronchitis and can contribute to pneumonia. If you are a smoker and continue to smoke, your cough may last several weeks after your pneumonia has cleared.   A cold steam vaporizer or humidifier in your room or home may help loosen mucus.   Coughing is often worse at night. Sleeping in a semi-upright position in a recliner or using a couple pillows under your head will help with this.   Get rest as you feel it is needed. Your body will usually let you know when you need to rest.  SEEK IMMEDIATE MEDICAL CARE IF:   Your illness  becomes worse. This is especially true if you are elderly or weakened from any other disease.   You cannot control your cough with suppressants and are losing sleep.   You begin coughing up blood.   You develop pain which is getting worse or is uncontrolled with medicines.   You have a fever.   Any of the symptoms which initially brought you in for treatment are getting worse rather than better.   You develop shortness of breath or chest pain.  MAKE SURE YOU:    Understand these instructions.   Will watch your condition.   Will get help right away if you are not doing well or get worse.  Document Released: 07/13/2005 Document Revised: 07/02/2011 Document Reviewed: 10/02/2010 [facility] Patient Information 2012 [place].     RESOURCE GUIDE  Dental Problems  Patients with Medicaid: [place] [facility] Phone:  [phone]                                                  Phone:  [phone]  If unable to pay or uninsured, contact:  [facility] or [facility]. to become qualified for the adult dental clinic.  Chronic Pain Problems Contact [facility]  [phone] Patients need to be referred by their primary care doctor.  Insufficient Money for Medicine Contact [employer]:  call "211" or [place] [phone].  No Primary Care Doctor Call [facility]  [phone] Other agencies that provide inexpensive medical care    [place]  [phone]    [facility]  [place]  [phone]    [facility]  [phone]    [facility]  [place]  [place]  [phone] [place]   [phone] (emergency services ([phone])  [facility]  ([phone] [facility] [phone] [facility] [place] [phone] [place] [place] [phone] Residential & Outpatient Substance Abuse Program  ([phone]  Abuse/Neglect [place] [phone] [place] [phone] (After Hours)  Emergency [place] [phone]  [place] at the [place] [phone] [place] [phone]  MRSA Hotline #:   [phone]    [facility] Dept. [address]      [place]  [place]ela [place] Phone:  [phone]                                   Phone:  [phone]                 Phone:  [place] Phone:  [place] [phone] (After Hours)

## 2011-10-07 NOTE — ED Notes (Signed)
Pt seen at [facility] on Monday. Dx with bronchitis and taking abx. Family states reluctance to give ibu or tylenol for fear of affecting chf or dm. Education provided.

## 2011-10-07 NOTE — ED Notes (Signed)
Patient with cough and shortness of breath since Saturday.  Patient has been coughing up white phlegm.  Patient also is experiencing fever and took 500mg  tylenol around 2:15 am.  Respiratory intact.

## 2011-10-07 NOTE — ED Notes (Signed)
Removed pt's iv before pt left for discharge

## 2011-10-07 NOTE — ED Notes (Signed)
Patient was seen at [facility] ED on 10/06/11 for same.

## 2011-10-07 NOTE — ED Provider Notes (Signed)
History     CSN: [id number]  Arrival date & time 10/07/11  [id number]   First MD Initiated Contact with Patient 10/07/11 0335      Chief Complaint  Patient presents with  . Shortness of Breath     HPI  History provided by the patient. Patient is a 61 year old female with history of hypertension, hyperlipidemia, diabetes and CHF who presents with complaints of productive cough for the past several days. Patient reports symptoms began with slight runny nose and cough Saturday 4 days ago. Symptoms have progressed since that time with feelings of fever and chills at home. Patient was evaluated for these symptoms on Monday at the emergency room. She was diagnosed with bronchitis given an albuterol inhaler to use her symptoms. Patient has been taking this without change in symptoms. She also has Tylenol with last dose around 2 AM for her fever symptoms. Patient complains of continued shortness of breath and generalized fatigue and body aches. Cough has been productive of white phlegm. She denies hemoptysis. She has some chest wall aches and pains worse breathing or cough.   Past Medical History  Diagnosis Date  . CHF (congestive heart failure)   . Diabetes mellitus   . Hypertension   . Diverticulitis   . Hyperlipidemia     Past Surgical History  Procedure Date  . Abdominal hysterectomy     No family history on file.  History  Substance Use Topics  . Smoking status: Former Research scientist (life sciences)  . Smokeless tobacco: Not on file  . Alcohol Use: No    OB History    Grav Para Term Preterm Abortions TAB SAB Ect Mult Living                  Review of Systems  Constitutional: Positive for fever and chills.  HENT: Positive for rhinorrhea. Negative for congestion and sore throat.   Respiratory: Positive for cough and shortness of breath.   Cardiovascular: Positive for chest pain.  Gastrointestinal: Negative for vomiting, abdominal pain, diarrhea and constipation.  All other systems reviewed and are  negative.    Allergies  Lisinopril  Home Medications   Current Outpatient Rx  Name Route Sig Dispense Refill  . ACETAMINOPHEN 500 MG PO TABS Oral Take 500 mg by mouth every 6 (six) hours as needed. For pain/fever    . AMLODIPINE BESYLATE 10 MG PO TABS Oral Take 10 mg by mouth daily.    . ASPIRIN 81 MG PO CHEW Oral Chew 81 mg by mouth daily.    . FUROSEMIDE 40 MG PO TABS Oral Take 40 mg by mouth 2 (two) times daily.    Marland Kitchen HYDRALAZINE HCL 100 MG PO TABS Oral Take 100 mg by mouth every 8 (eight) hours.    Marland Kitchen HUMULIN 70/30 [place] Subcutaneous Inject 15-20 Units into the skin 2 (two) times daily. Takes 20 units in the morning and 15 units in the evening    . LOSARTAN POTASSIUM 100 MG PO TABS Oral Take 100 mg by mouth daily.    Marland Kitchen METFORMIN HCL 500 MG PO TABS Oral Take 500 mg by mouth 2 (two) times daily with a meal.    . METOPROLOL TARTRATE 100 MG PO TABS Oral Take 100 mg by mouth 2 (two) times daily.    Marland Kitchen MOXIFLOXACIN HCL 400 MG PO TABS Oral Take 1 tablet (400 mg total) by mouth daily. 7 tablet 0  . POTASSIUM CHLORIDE CRYS ER 20 MEQ PO TBCR Oral Take 20 mEq by mouth  2 (two) times daily. Due to the size of this pill, the patient may take by dissolving in a spoonful of water    . SIMVASTATIN 40 MG PO TABS Oral Take 40 mg by mouth every evening.    Marland Kitchen SPIRONOLACTONE 25 MG PO TABS Oral Take 25 mg by mouth daily.      BP 152/65  Pulse 81  Temp(Src) 102.2 F (39 C) (Oral)  Resp 20  SpO2 96%  Physical Exam  Nursing note and vitals reviewed. Constitutional: She is oriented to person, place, and time. She appears well-developed and well-nourished. No distress.  HENT:  Head: Normocephalic and atraumatic.  Mouth/Throat: Oropharynx is clear and moist.  Cardiovascular: Normal rate and regular rhythm.   Pulmonary/Chest: Effort normal. No respiratory distress. She has no wheezes. She has rales. She exhibits no tenderness.  Abdominal: Soft. She exhibits no distension. There is no tenderness.    Musculoskeletal: She exhibits no edema and no tenderness.  Neurological: She is alert and oriented to person, place, and time.  Skin: Skin is warm and dry. No rash noted.  Psychiatric: She has a normal mood and affect. Her behavior is normal.    ED Course  Procedures   Results for orders placed during the hospital encounter of 10/07/11  CBC      Component Value Range   WBC 9.0  4.0 - 10.5 (K/uL)   RBC 3.60 (*) 3.87 - 5.11 (MIL/uL)   Hemoglobin 10.3 (*) 12.0 - 15.0 (g/dL)   HCT 30.2 (*) 36.0 - 46.0 (%)   MCV 83.9  78.0 - 100.0 (fL)   MCH 28.6  26.0 - 34.0 (pg)   MCHC 34.1  30.0 - 36.0 (g/dL)   RDW 12.9  11.5 - 15.5 (%)   Platelets 290  150 - 400 (K/uL)  DIFFERENTIAL      Component Value Range   Neutrophils Relative 80 (*) 43 - 77 (%)   Neutro Abs 7.2  1.7 - 7.7 (K/uL)   Lymphocytes Relative 16  12 - 46 (%)   Lymphs Abs 1.4  0.7 - 4.0 (K/uL)   Monocytes Relative 3  3 - 12 (%)   Monocytes Absolute 0.3  0.1 - 1.0 (K/uL)   Eosinophils Relative 0  0 - 5 (%)   Eosinophils Absolute 0.0  0.0 - 0.7 (K/uL)   Basophils Relative 0  0 - 1 (%)   Basophils Absolute 0.0  0.0 - 0.1 (K/uL)        Dg Chest 2 View  10/07/2011  *RADIOLOGY REPORT*  Clinical Data: Cough, fever, shortness of breath.  CHEST - 2 VIEW  Comparison: 10/05/2011  Findings: Bilateral lower lobe nodular airspace opacities have increased in the interval.  Cardiomediastinal contours are unchanged.  Aortic arch atherosclerosis is mild.  No pneumothorax. No pleural effusion.  No acute osseous abnormality.  IMPRESSION: Increased bilateral patchy and nodular airspace opacities. Infection or edema favored given the clinical history and rapid progression.  Recommend radiographic follow-up to resolution.  Original Report Authenticated By: Suanne Marker, M.D.     1. CAP (community acquired pneumonia)       MDM  Patient seen and evaluated. Patient in no acute distress.  Pt discussed with attending physician.  Pt  currently on avelox fof possible CAP.  She has only taken one dose and is early in treatment.  Pt is not tachycardic or hypoxic.  Temp has improved after treatment with Ibuprofen and tylenol.  Pt instructed to continue medications and increase  tylenol dose at home with addition of ibuprofen for fever.  Plan to d/c home with pcp followup.      Martie Lee, [place] 10/08/11 [phone]

## 2011-10-11 NOTE — ED Provider Notes (Signed)
Medical screening examination/treatment/procedure(s) were performed by non-physician practitioner and as supervising physician I was immediately available for consultation/collaboration.   Carmin Muskrat, MD 10/11/11 1510

## 2012-02-04 ENCOUNTER — Encounter (HOSPITAL_COMMUNITY): Payer: Self-pay | Admitting: *Deleted

## 2012-02-04 ENCOUNTER — Emergency Department (HOSPITAL_COMMUNITY)
Admission: EM | Admit: 2012-02-04 | Discharge: 2012-02-05 | Disposition: A | Discharge status: Home / Self Care | Payer: BC Managed Care – PPO | Attending: Emergency Medicine | Admitting: Emergency Medicine

## 2012-02-04 ENCOUNTER — Emergency Department (HOSPITAL_COMMUNITY): Payer: BC Managed Care – PPO

## 2012-02-04 DIAGNOSIS — E119 Type 2 diabetes mellitus without complications: Secondary | ICD-10-CM | POA: Insufficient documentation

## 2012-02-04 DIAGNOSIS — E785 Hyperlipidemia, unspecified: Secondary | ICD-10-CM | POA: Insufficient documentation

## 2012-02-04 DIAGNOSIS — R0602 Shortness of breath: Secondary | ICD-10-CM | POA: Insufficient documentation

## 2012-02-04 DIAGNOSIS — R0989 Other specified symptoms and signs involving the circulatory and respiratory systems: Secondary | ICD-10-CM | POA: Insufficient documentation

## 2012-02-04 DIAGNOSIS — I10 Essential (primary) hypertension: Secondary | ICD-10-CM | POA: Insufficient documentation

## 2012-02-04 DIAGNOSIS — E876 Hypokalemia: Secondary | ICD-10-CM

## 2012-02-04 DIAGNOSIS — Z79899 Other long term (current) drug therapy: Secondary | ICD-10-CM | POA: Insufficient documentation

## 2012-02-04 DIAGNOSIS — Z7982 Long term (current) use of aspirin: Secondary | ICD-10-CM | POA: Insufficient documentation

## 2012-02-04 DIAGNOSIS — R0609 Other forms of dyspnea: Secondary | ICD-10-CM | POA: Insufficient documentation

## 2012-02-04 DIAGNOSIS — I509 Heart failure, unspecified: Secondary | ICD-10-CM | POA: Insufficient documentation

## 2012-02-04 LAB — POCT I-STAT, CHEM 8
BUN: 15 mg/dL (ref 6–23)
Calcium, Ion: 1.13 mmol/L (ref 1.13–1.30)
Chloride: 100 mEq/L (ref 96–112)
Creatinine, Ser: 1.4 mg/dL — ABNORMAL HIGH (ref 0.50–1.10)
Glucose, Bld: 243 mg/dL — ABNORMAL HIGH (ref 70–99)
HCT: 34 % — ABNORMAL LOW (ref 36.0–46.0)
Hemoglobin: 11.6 g/dL — ABNORMAL LOW (ref 12.0–15.0)
Potassium: 3 mEq/L — ABNORMAL LOW (ref 3.5–5.1)
Sodium: 137 mEq/L (ref 135–145)
TCO2: 26 mmol/L (ref 0–100)

## 2012-02-04 LAB — CBC WITH DIFFERENTIAL/PLATELET
Basophils Absolute: 0 10*3/uL (ref 0.0–0.1)
Basophils Relative: 0 % (ref 0–1)
Eosinophils Absolute: 0.3 10*3/uL (ref 0.0–0.7)
Eosinophils Relative: 3 % (ref 0–5)
HCT: 33.2 % — ABNORMAL LOW (ref 36.0–46.0)
Hemoglobin: 11 g/dL — ABNORMAL LOW (ref 12.0–15.0)
Lymphocytes Relative: 18 % (ref 12–46)
Lymphs Abs: 1.8 10*3/uL (ref 0.7–4.0)
MCH: 28.4 pg (ref 26.0–34.0)
MCHC: 33.1 g/dL (ref 30.0–36.0)
MCV: 85.6 fL (ref 78.0–100.0)
Monocytes Absolute: 0.4 10*3/uL (ref 0.1–1.0)
Monocytes Relative: 4 % (ref 3–12)
Neutro Abs: 7.3 10*3/uL (ref 1.7–7.7)
Neutrophils Relative %: 75 % (ref 43–77)
Platelets: 443 10*3/uL — ABNORMAL HIGH (ref 150–400)
RBC: 3.88 MIL/uL (ref 3.87–5.11)
RDW: 13.4 % (ref 11.5–15.5)
WBC: 9.6 10*3/uL (ref 4.0–10.5)

## 2012-02-04 LAB — POCT I-STAT 3, VENOUS BLOOD GAS (G3P V)
Acid-Base Excess: 4 mmol/L — ABNORMAL HIGH (ref 0.0–2.0)
Bicarbonate: 29.6 mEq/L — ABNORMAL HIGH (ref 20.0–24.0)
O2 Saturation: 72 %
TCO2: 31 mmol/L (ref 0–100)
pCO2, Ven: 48.7 mmHg (ref 45.0–50.0)
pH, Ven: 7.392 — ABNORMAL HIGH (ref 7.250–7.300)
pO2, Ven: 39 mmHg (ref 30.0–45.0)

## 2012-02-04 MED ORDER — SODIUM CHLORIDE 0.9 % IV SOLN
Freq: Once | INTRAVENOUS | Status: AC
  Administered 2012-02-04: via INTRAVENOUS

## 2012-02-04 MED ORDER — NITROGLYCERIN IN D5W 200-5 MCG/ML-% IV SOLN
INTRAVENOUS | Status: AC
  Filled 2012-02-04: qty 250

## 2012-02-04 MED ORDER — NITROGLYCERIN IN D5W 200-5 MCG/ML-% IV SOLN
2.0000 ug/min | Freq: Once | INTRAVENOUS | Status: AC
  Administered 2012-02-04: 5 ug/min via INTRAVENOUS

## 2012-02-04 NOTE — ED Notes (Signed)
The pt  C/o   Sob.  sats 78  On a non-rebreather.  The pt arrived on c-pap .   Wheezes .  No c/o  Pain.  Alert

## 2012-02-04 NOTE — ED Provider Notes (Signed)
History     CSN: [id number]  Arrival date & time 02/04/12  2300   First MD Initiated Contact with Patient 02/04/12 2318      Chief Complaint  Patient presents with  . Shortness of Breath    (Consider location/radiation/quality/duration/timing/severity/associated sxs/prior treatment) HPI Level 5 Caveat: on BiPAP. This is a 45 black female with a history of congestive heart failure. She had the fairly sudden onset of dyspnea this evening. Her husband gave her 40 mg of Lasix by mouth. He was driving her to the hospital but she worsens so he took her to a nearby far station. The first dictation provided oxygen and EMS was summoned. EMS placed her on CPAP. They report that she initially had diffuse rales but by the time she arrived here she only had rales in the bases. She was placed on BiPAP by respiratory therapy on arrival here. Her shortness of breath was moderate to severe at its worst but is improved significantly on BiPAP. She denies chest pain. She has had nausea and vomiting which is common with her episodes of CHF. She's had diarrhea which she attributes to her diabetes medications.  Past Medical History  Diagnosis Date  . CHF (congestive heart failure)   . Diabetes mellitus   . Hypertension   . Diverticulitis   . Hyperlipidemia     Past Surgical History  Procedure Date  . Abdominal hysterectomy     No family history on file.  History  Substance Use Topics  . Smoking status: Former Research scientist (life sciences)  . Smokeless tobacco: Not on file  . Alcohol Use: No    OB History    Grav Para Term Preterm Abortions TAB SAB Ect Mult Living                  Review of Systems  Unable to perform ROS   Allergies  Lisinopril  Home Medications   Current Outpatient Rx  Name Route Sig Dispense Refill  . ACETAMINOPHEN 500 MG PO TABS Oral Take 500 mg by mouth every 6 (six) hours as needed. For pain/fever    . AMLODIPINE BESYLATE 10 MG PO TABS Oral Take 10 mg by mouth daily.      . ASPIRIN 81 MG PO CHEW Oral Chew 81 mg by mouth daily.    Marland Kitchen HYDROCOD POLST-CPM POLST ER 10-8 MG/5ML PO LQCR Oral Take 5 mLs by mouth every 12 (twelve) hours. Take 5 mLs by mouth every 12 (twelve) hours. 140 mL 0  . FUROSEMIDE 40 MG PO TABS Oral Take 40 mg by mouth 2 (two) times daily.    Marland Kitchen HYDRALAZINE HCL 100 MG PO TABS Oral Take 100 mg by mouth every 8 (eight) hours.    . INSULIN ISOPHANE & REGULAR (70-30) 100 UNIT/ML [place] SUSP Subcutaneous Inject 15-20 Units into the skin 2 (two) times daily with a meal. Use 20 units in the morning Use 15 units in the evening    . LOSARTAN POTASSIUM 100 MG PO TABS Oral Take 100 mg by mouth daily.    Marland Kitchen METFORMIN HCL 500 MG PO TABS Oral Take 500 mg by mouth 2 (two) times daily with a meal.    . METOPROLOL TARTRATE 100 MG PO TABS Oral Take 100 mg by mouth 2 (two) times daily.    Marland Kitchen POTASSIUM CHLORIDE CRYS ER 20 MEQ PO TBCR Oral Take 20 mEq by mouth 2 (two) times daily. Due to the size of this pill, the patient may take by dissolving in a  spoonful of water    . SIMVASTATIN 40 MG PO TABS Oral Take 40 mg by mouth every evening.    Marland Kitchen SPIRONOLACTONE 25 MG PO TABS Oral Take 25 mg by mouth daily.      BP 218/124  Pulse 127  Temp 96.7 F (35.9 C) (Axillary)  Resp 28  SpO2 100%  Physical Exam General: Well-developed, well-nourished female in no acute distress; appearance consistent with age of record HENT: normocephalic, atraumatic Eyes: pupils equal round and reactive to light; extraocular muscles intact Neck: supple Heart: regular rate and rhythm; tachycardic; distant sounds Lungs: On BiPAP; tachypnea; rales in bases Abdomen: soft; nondistended; nontender Extremities: No deformity; full range of motion Neurologic: Awake, alert; motor function intact in all extremities and symmetric; no facial droop Skin: Warm and dry     ED Course  Procedures (including critical care time)     MDM   Nursing notes and vitals signs, including pulse oximetry,  reviewed.  Summary of this visit's results, reviewed by myself:  Labs:  Results for orders placed during the hospital encounter of 02/04/12  URINALYSIS, ROUTINE W REFLEX MICROSCOPIC      Component Value Range   Color, Urine YELLOW  YELLOW   APPearance CLEAR  CLEAR   Specific Gravity, Urine 1.014  1.005 - 1.030   pH 6.5  5.0 - 8.0   Glucose, UA 250 (*) NEGATIVE mg/dL   Hgb urine dipstick SMALL (*) NEGATIVE   Bilirubin Urine NEGATIVE  NEGATIVE   Ketones, ur NEGATIVE  NEGATIVE mg/dL   Protein, ur >300 (*) NEGATIVE mg/dL   Urobilinogen, UA 1.0  0.0 - 1.0 mg/dL   Nitrite NEGATIVE  NEGATIVE   Leukocytes, UA NEGATIVE  NEGATIVE  CBC WITH DIFFERENTIAL      Component Value Range   WBC 9.6  4.0 - 10.5 K/uL   RBC 3.88  3.87 - 5.11 MIL/uL   Hemoglobin 11.0 (*) 12.0 - 15.0 g/dL   HCT 33.2 (*) 36.0 - 46.0 %   MCV 85.6  78.0 - 100.0 fL   MCH 28.4  26.0 - 34.0 pg   MCHC 33.1  30.0 - 36.0 g/dL   RDW 13.4  11.5 - 15.5 %   Platelets 443 (*) 150 - 400 K/uL   Neutrophils Relative 75  43 - 77 %   Neutro Abs 7.3  1.7 - 7.7 K/uL   Lymphocytes Relative 18  12 - 46 %   Lymphs Abs 1.8  0.7 - 4.0 K/uL   Monocytes Relative 4  3 - 12 %   Monocytes Absolute 0.4  0.1 - 1.0 K/uL   Eosinophils Relative 3  0 - 5 %   Eosinophils Absolute 0.3  0.0 - 0.7 K/uL   Basophils Relative 0  0 - 1 %   Basophils Absolute 0.0  0.0 - 0.1 K/uL  TROPONIN I      Component Value Range   Troponin I <0.30  <0.30 ng/mL  PRO B NATRIURETIC PEPTIDE      Component Value Range   Pro B Natriuretic peptide (BNP) 8233.0 (*) 0 - 125 pg/mL  POCT I-STAT 3, BLOOD GAS (G3P V)      Component Value Range   pH, Ven 7.392 (*) 7.250 - 7.300   pCO2, Ven 48.7  45.0 - 50.0 mmHg   pO2, Ven 39.0  30.0 - 45.0 mmHg   Bicarbonate 29.6 (*) 20.0 - 24.0 mEq/L   TCO2 31  0 - 100 mmol/L   O2 Saturation 72.0  Acid-Base Excess 4.0 (*) 0.0 - 2.0 mmol/L   Sample type VENOUS     Comment NOTIFIED PHYSICIAN    POCT I-STAT, CHEM 8      Component  Value Range   Sodium 137  135 - 145 mEq/L   Potassium 3.0 (*) 3.5 - 5.1 mEq/L   Chloride 100  96 - 112 mEq/L   BUN 15  6 - 23 mg/dL   Creatinine, Ser 1.40 (*) 0.50 - 1.10 mg/dL   Glucose, Bld 243 (*) 70 - 99 mg/dL   Calcium, Ion 1.13  1.13 - 1.30 mmol/L   TCO2 26  0 - 100 mmol/L   Hemoglobin 11.6 (*) 12.0 - 15.0 g/dL   HCT 34.0 (*) 36.0 - 46.0 %    Imaging Studies: Dg Chest Portable 1 View  02/04/2012  *RADIOLOGY REPORT*  Clinical Data: Shortness of breath.  PORTABLE CHEST - 1 VIEW  Comparison: Chest radiograph performed 10/07/2011  Findings: The lungs are well-aerated.  Mild vascular congestion is noted, with mild bibasilar atelectasis.  There is no evidence of pleural effusion or pneumothorax.  The cardiomediastinal silhouette is borderline normal in size.  No acute osseous abnormalities are seen.  IMPRESSION: Mild vascular congestion, and mild bibasilar atelectasis.  Original Report Authenticated By: Santa Lighter, M.D.   1:04 AM Comfortable on nasal cannula @ 3 L.  2:54 AM The patient is now ambulating around the department without dyspnea. Oxygen saturation on room air is 94%. She does not wish to be admitted, but states she will return if her symptoms worsen.   EKG Interpretation:  Date & Time: 02/04/2012 11:02 PM  Rate: 126  Rhythm: sinus tachycardia  QRS Axis: normal  Intervals: normal  ST/T Wave abnormalities: nonspecific T wave changes  Conduction Disutrbances:none  Narrative Interpretation: Poor R-wave progression  Old EKG Reviewed: Rate is faster         Wynetta Fines, MD 02/05/12 0255

## 2012-02-04 NOTE — ED Notes (Signed)
Pt come into ED via EMS in respiratory distress and on CPAP. RT placed pt on BIPAP 10/5, 50%. Pt tolerating well, RT will continue to monitor.

## 2012-02-05 LAB — URINE MICROSCOPIC-ADD ON

## 2012-02-05 LAB — URINALYSIS, ROUTINE W REFLEX MICROSCOPIC
Bilirubin Urine: NEGATIVE
Glucose, UA: 250 mg/dL — AB
Ketones, ur: NEGATIVE mg/dL
Leukocytes, UA: NEGATIVE
Nitrite: NEGATIVE
Protein, ur: >300 mg/dL — AB
Specific Gravity, Urine: 1.014 (ref 1.005–1.030)
Urobilinogen, UA: 1 mg/dL (ref 0.0–1.0)
pH: 6.5 (ref 5.0–8.0)

## 2012-02-05 LAB — PRO B NATRIURETIC PEPTIDE: Pro B Natriuretic peptide (BNP): 8233 pg/mL — ABNORMAL HIGH (ref 0–125)

## 2012-02-05 LAB — TROPONIN I: Troponin I: <0.3 ng/mL (ref <0.30)

## 2012-02-05 MED ORDER — POTASSIUM CHLORIDE CRYS ER 20 MEQ PO TBCR
40.0000 meq | EXTENDED_RELEASE_TABLET | Freq: Once | ORAL | Status: AC
  Administered 2012-02-05: 40 meq via ORAL
  Filled 2012-02-05: qty 2

## 2012-02-05 NOTE — ED Notes (Signed)
Spoke with Dr. Florina Ou, okay to stop Nitro and continue to assess pt. Pt is currently on Venti Mask, saturations 88-92% on mask at this time.  Pt is resting. Will continue to assess

## 2012-08-23 ENCOUNTER — Emergency Department (HOSPITAL_COMMUNITY): Payer: BC Managed Care – PPO

## 2012-08-23 ENCOUNTER — Inpatient Hospital Stay (HOSPITAL_COMMUNITY)
Admission: EM | Admit: 2012-08-23 | Discharge: 2012-08-26 | DRG: 127 | Disposition: A | Discharge status: Home Health | Payer: BC Managed Care – PPO | Attending: Internal Medicine | Admitting: Internal Medicine

## 2012-08-23 ENCOUNTER — Encounter (HOSPITAL_COMMUNITY): Payer: Self-pay

## 2012-08-23 DIAGNOSIS — Z79899 Other long term (current) drug therapy: Secondary | ICD-10-CM

## 2012-08-23 DIAGNOSIS — Z87891 Personal history of nicotine dependence: Secondary | ICD-10-CM

## 2012-08-23 DIAGNOSIS — Z794 Long term (current) use of insulin: Secondary | ICD-10-CM

## 2012-08-23 DIAGNOSIS — E1165 Type 2 diabetes mellitus with hyperglycemia: Secondary | ICD-10-CM | POA: Diagnosis not present

## 2012-08-23 DIAGNOSIS — Z91199 Patient's noncompliance with other medical treatment and regimen due to unspecified reason: Secondary | ICD-10-CM

## 2012-08-23 DIAGNOSIS — Z7982 Long term (current) use of aspirin: Secondary | ICD-10-CM

## 2012-08-23 DIAGNOSIS — I5033 Acute on chronic diastolic (congestive) heart failure: Principal | ICD-10-CM | POA: Diagnosis present

## 2012-08-23 DIAGNOSIS — I509 Heart failure, unspecified: Secondary | ICD-10-CM | POA: Diagnosis present

## 2012-08-23 DIAGNOSIS — J45909 Unspecified asthma, uncomplicated: Secondary | ICD-10-CM | POA: Diagnosis present

## 2012-08-23 DIAGNOSIS — E1169 Type 2 diabetes mellitus with other specified complication: Secondary | ICD-10-CM | POA: Diagnosis present

## 2012-08-23 DIAGNOSIS — I160 Hypertensive urgency: Secondary | ICD-10-CM

## 2012-08-23 DIAGNOSIS — I10 Essential (primary) hypertension: Secondary | ICD-10-CM | POA: Diagnosis present

## 2012-08-23 DIAGNOSIS — E785 Hyperlipidemia, unspecified: Secondary | ICD-10-CM | POA: Diagnosis present

## 2012-08-23 DIAGNOSIS — R739 Hyperglycemia, unspecified: Secondary | ICD-10-CM

## 2012-08-23 DIAGNOSIS — M1710 Unilateral primary osteoarthritis, unspecified knee: Secondary | ICD-10-CM | POA: Diagnosis present

## 2012-08-23 DIAGNOSIS — J309 Allergic rhinitis, unspecified: Secondary | ICD-10-CM

## 2012-08-23 DIAGNOSIS — R0600 Dyspnea, unspecified: Secondary | ICD-10-CM

## 2012-08-23 DIAGNOSIS — E119 Type 2 diabetes mellitus without complications: Secondary | ICD-10-CM | POA: Diagnosis present

## 2012-08-23 DIAGNOSIS — E876 Hypokalemia: Secondary | ICD-10-CM | POA: Diagnosis present

## 2012-08-23 DIAGNOSIS — Z9119 Patient's noncompliance with other medical treatment and regimen: Secondary | ICD-10-CM

## 2012-08-23 DIAGNOSIS — IMO0002 Reserved for concepts with insufficient information to code with codable children: Secondary | ICD-10-CM | POA: Diagnosis not present

## 2012-08-23 HISTORY — DX: Unspecified asthma, uncomplicated: J45.909

## 2012-08-23 HISTORY — DX: Unspecified osteoarthritis, unspecified site: M19.90

## 2012-08-23 LAB — CBC WITH DIFFERENTIAL/PLATELET
Basophils Absolute: 0 10*3/uL (ref 0.0–0.1)
Basophils Relative: 0 % (ref 0–1)
Eosinophils Absolute: 0.4 10*3/uL (ref 0.0–0.7)
Eosinophils Relative: 4 % (ref 0–5)
HCT: 31.1 % — ABNORMAL LOW (ref 36.0–46.0)
Hemoglobin: 10.6 g/dL — ABNORMAL LOW (ref 12.0–15.0)
Lymphocytes Relative: 32 % (ref 12–46)
Lymphs Abs: 3.6 10*3/uL (ref 0.7–4.0)
MCH: 29 pg (ref 26.0–34.0)
MCHC: 34.1 g/dL (ref 30.0–36.0)
MCV: 85.2 fL (ref 78.0–100.0)
Monocytes Absolute: 0.7 10*3/uL (ref 0.1–1.0)
Monocytes Relative: 6 % (ref 3–12)
Neutro Abs: 6.5 10*3/uL (ref 1.7–7.7)
Neutrophils Relative %: 58 % (ref 43–77)
Platelets: 464 10*3/uL — ABNORMAL HIGH (ref 150–400)
RBC: 3.65 MIL/uL — ABNORMAL LOW (ref 3.87–5.11)
RDW: 12.8 % (ref 11.5–15.5)
WBC: 11.2 10*3/uL — ABNORMAL HIGH (ref 4.0–10.5)

## 2012-08-23 MED ORDER — ASPIRIN 81 MG PO CHEW
324.0000 mg | CHEWABLE_TABLET | Freq: Once | ORAL | Status: AC
  Administered 2012-08-23: 324 mg via ORAL
  Filled 2012-08-23: qty 4

## 2012-08-23 MED ORDER — FUROSEMIDE 10 MG/ML IJ SOLN
40.0000 mg | Freq: Once | INTRAMUSCULAR | Status: AC
  Administered 2012-08-23: 40 mg via INTRAVENOUS
  Filled 2012-08-23: qty 4

## 2012-08-23 MED ORDER — HYDRALAZINE HCL 20 MG/ML IJ SOLN
10.0000 mg | Freq: Once | INTRAMUSCULAR | Status: AC
  Administered 2012-08-23: 10 mg via INTRAVENOUS
  Filled 2012-08-23: qty 1

## 2012-08-23 MED ORDER — NITROGLYCERIN IN D5W 200-5 MCG/ML-% IV SOLN: 5.0000 ug/min | INTRAVENOUS | Status: DC

## 2012-08-23 NOTE — ED Notes (Addendum)
Patient presents with c/o cough x 1 week. PCP phoned in [place] which she completed today. Patient continues to have a productive cough, occasional chills and midchest tightness. Denies fevers, arm pain, shoulder pain, N/V, headache or dizziness.

## 2012-08-23 NOTE — ED Provider Notes (Signed)
History     CSN: [id number]  Arrival date & time 08/23/12  2317   First MD Initiated Contact with Patient 08/23/12 2318      Chief Complaint  Patient presents with  . Cough  . Chest Pain  . Shortness of Breath    (Consider location/radiation/quality/duration/timing/severity/associated sxs/prior treatment) HPI 62 year old female presents to emergency room from home with complaint of shortness of breath. She reports she's had a cough for the last week and a half years she finished a Z-Pak today. She denies any fever, denies any chest pain. She reports she laid down for a nap, and awoke acutely at 11:30 with worsening shortness of breath. She has history of hypertension, diabetes, congestive heart failure. She reports she's not taken doses of her evening meds, but has otherwise been compliant with her medication regimen. She reports recently had a change in her blood pressure medicines. She's been off the clonidine patch for 2 weeks. She denies any headache, no weakness, no change in vision. Cough is nonproductive. Husband reports she has had posttussive emesis at times. Past Medical History  Diagnosis Date  . CHF (congestive heart failure)   . Diabetes mellitus   . Hypertension   . Diverticulitis   . Hyperlipidemia     Past Surgical History  Procedure Date  . Abdominal hysterectomy     No family history on file.  History  Substance Use Topics  . Smoking status: Former Research scientist (life sciences)  . Smokeless tobacco: Never Used  . Alcohol Use: No    OB History    Grav Para Term Preterm Abortions TAB SAB Ect Mult Living                  Review of Systems  See History of Present Illness; otherwise all other systems are reviewed and negative  Allergies  Lisinopril  Home Medications   Current Outpatient Rx  Name  Route  Sig  Dispense  Refill  . ACETAMINOPHEN 500 MG PO TABS   Oral   Take 500 mg by mouth every 6 (six) hours as needed. For pain/fever         . AMLODIPINE BESYLATE 10  MG PO TABS   Oral   Take 10 mg by mouth daily.         . ASPIRIN 81 MG PO CHEW   Oral   Chew 81 mg by mouth daily.         Marland Kitchen HYDROCOD POLST-CPM POLST ER 10-8 MG/5ML PO LQCR   Oral   Take 5 mLs by mouth every 12 (twelve) hours. Take 5 mLs by mouth every 12 (twelve) hours.   140 mL   0   . FUROSEMIDE 40 MG PO TABS   Oral   Take 40 mg by mouth 2 (two) times daily.         Marland Kitchen HYDRALAZINE HCL 100 MG PO TABS   Oral   Take 100 mg by mouth every 8 (eight) hours.         . INSULIN ISOPHANE & REGULAR (70-30) 100 UNIT/ML [place] SUSP   Subcutaneous   Inject 15-20 Units into the skin 2 (two) times daily with a meal. Use 20 units in the morning Use 15 units in the evening         . LOSARTAN POTASSIUM 100 MG PO TABS   Oral   Take 100 mg by mouth daily.         Marland Kitchen METFORMIN HCL 500 MG PO TABS  Oral   Take 500 mg by mouth 2 (two) times daily with a meal.         . METOPROLOL TARTRATE 100 MG PO TABS   Oral   Take 100 mg by mouth 2 (two) times daily.         Marland Kitchen POTASSIUM CHLORIDE CRYS ER 20 MEQ PO TBCR   Oral   Take 20 mEq by mouth 2 (two) times daily. Due to the size of this pill, the patient may take by dissolving in a spoonful of water         . SIMVASTATIN 40 MG PO TABS   Oral   Take 40 mg by mouth every evening.         Marland Kitchen SPIRONOLACTONE 25 MG PO TABS   Oral   Take 25 mg by mouth daily.           BP 255/120  Temp 97.7 F (36.5 C) (Oral)  Resp 22  SpO2 94%  Physical Exam  Nursing note and vitals reviewed. Constitutional: She is oriented to person, place, and time. She appears well-developed and well-nourished. She appears distressed (mild tachypnea).  HENT:  Head: Normocephalic and atraumatic.  Nose: Nose normal.  Mouth/Throat: Oropharynx is clear and moist.  Eyes: Conjunctivae normal and EOM are normal. Pupils are equal, round, and reactive to light.  Neck: Normal range of motion. Neck supple. No JVD present. No tracheal deviation present. No  thyromegaly present.  Cardiovascular: Regular rhythm, normal heart sounds and intact distal pulses.  Exam reveals no gallop and no friction rub.   No murmur heard.      Significant tachycardia noted  Pulmonary/Chest: Effort normal. No stridor. No respiratory distress. She has no wheezes. She has rales (in bases bilaterally). She exhibits no tenderness.       Patient with low oxygen saturations, 90-91 on room air, noted to be tachypneic  Abdominal: Soft. Bowel sounds are normal. She exhibits no distension and no mass. There is no tenderness. There is no rebound and no guarding.  Musculoskeletal: Normal range of motion. She exhibits no edema and no tenderness.  Lymphadenopathy:    She has no cervical adenopathy.  Neurological: She is alert and oriented to person, place, and time. She exhibits normal muscle tone. Coordination normal.  Skin: Skin is warm and dry. No rash noted. No erythema. No pallor.  Psychiatric: She has a normal mood and affect. Her behavior is normal. Judgment and thought content normal.    ED Course  Procedures (including critical care time)  Labs Reviewed  CBC WITH DIFFERENTIAL - Abnormal; Notable for the following:    WBC 11.2 (*)     RBC 3.65 (*)     Hemoglobin 10.6 (*)     HCT 31.1 (*)     Platelets 464 (*)     All other components within normal limits  PRO B NATRIURETIC PEPTIDE  TROPONIN I  TROPONIN I  TROPONIN I  BASIC METABOLIC PANEL   No results found.   Date: 08/23/2012  Rate: 106  Rhythm: sinus tachycardia  QRS Axis: normal  Intervals: QT prolonged  ST/T Wave abnormalities: nonspecific T wave changes  Conduction Disutrbances:none  Narrative Interpretation:   Old EKG Reviewed: unchanged   Date: 08/23/2012  Rate: 141  Rhythm: sinus tachycardia  QRS Axis: normal  Intervals: QT prolonged  ST/T Wave abnormalities: nonspecific T wave changes  Conduction Disutrbances:none  Narrative Interpretation:   Old EKG Reviewed: unchanged     1.  Hypertensive  urgency   2. Diastolic CHF, acute on chronic   3. DIABETES MELLITUS, TYPE II       MDM  62 year old female with acute shortness of breath after 8-10 days of coughing. Patient with significant hypertension and tachycardia upon arrival with tachypnea. Will check chest x-ray, BNP baseline labs and troponin. We'll give her her evening doses of medications. Patient may require admission to the hospital if she is in signficant congestive heart failure.   12:31 AM Patient noted to have hyperglycemia, hypokalemia, and signs of congestive heart failure. She has elevated BNP, as well as fluid noted on chest x-ray. Her blood pressure is slightly improved after IV Lasix and hydralazine. Will discuss with hospitalist for admission. It appears that she was recently taken off her ARB and data blocker, as well as clonidine patch. It is unclear if this change in medications prompted her symptoms.       Kalman Drape, MD 08/24/12 [phone]

## 2012-08-23 NOTE — ED Notes (Signed)
MD at bedside. 

## 2012-08-24 DIAGNOSIS — E876 Hypokalemia: Secondary | ICD-10-CM

## 2012-08-24 DIAGNOSIS — R0609 Other forms of dyspnea: Secondary | ICD-10-CM

## 2012-08-24 DIAGNOSIS — I5033 Acute on chronic diastolic (congestive) heart failure: Principal | ICD-10-CM

## 2012-08-24 DIAGNOSIS — I509 Heart failure, unspecified: Secondary | ICD-10-CM

## 2012-08-24 DIAGNOSIS — I10 Essential (primary) hypertension: Secondary | ICD-10-CM

## 2012-08-24 DIAGNOSIS — E119 Type 2 diabetes mellitus without complications: Secondary | ICD-10-CM

## 2012-08-24 LAB — GLUCOSE, CAPILLARY
Glucose-Capillary: 226 mg/dL — ABNORMAL HIGH (ref 70–99)
Glucose-Capillary: 192 mg/dL — ABNORMAL HIGH (ref 70–99)
Glucose-Capillary: 61 mg/dL — ABNORMAL LOW (ref 70–99)
Glucose-Capillary: 68 mg/dL — ABNORMAL LOW (ref 70–99)
Glucose-Capillary: 109 mg/dL — ABNORMAL HIGH (ref 70–99)
Glucose-Capillary: 161 mg/dL — ABNORMAL HIGH (ref 70–99)
Glucose-Capillary: 39 mg/dL — CL (ref 70–99)
Glucose-Capillary: 42 mg/dL — CL (ref 70–99)
Glucose-Capillary: 79 mg/dL (ref 70–99)
Glucose-Capillary: 174 mg/dL — ABNORMAL HIGH (ref 70–99)

## 2012-08-24 LAB — PRO B NATRIURETIC PEPTIDE: Pro B Natriuretic peptide (BNP): 7978 pg/mL — ABNORMAL HIGH (ref 0–125)

## 2012-08-24 LAB — BASIC METABOLIC PANEL
BUN: 24 mg/dL — ABNORMAL HIGH (ref 6–23)
BUN: 24 mg/dL — ABNORMAL HIGH (ref 6–23)
CO2: 24 mEq/L (ref 19–32)
CO2: 27 mEq/L (ref 19–32)
Calcium: 8.6 mg/dL (ref 8.4–10.5)
Calcium: 8.7 mg/dL (ref 8.4–10.5)
Chloride: 100 mEq/L (ref 96–112)
Chloride: 97 mEq/L (ref 96–112)
Creatinine, Ser: 1.31 mg/dL — ABNORMAL HIGH (ref 0.50–1.10)
Creatinine, Ser: 1.35 mg/dL — ABNORMAL HIGH (ref 0.50–1.10)
GFR calc Af Amer: 48 mL/min — ABNORMAL LOW (ref >90)
GFR calc Af Amer: 50 mL/min — ABNORMAL LOW (ref >90)
GFR calc non Af Amer: 41 mL/min — ABNORMAL LOW (ref >90)
GFR calc non Af Amer: 43 mL/min — ABNORMAL LOW (ref >90)
Glucose, Bld: 241 mg/dL — ABNORMAL HIGH (ref 70–99)
Glucose, Bld: 295 mg/dL — ABNORMAL HIGH (ref 70–99)
Potassium: 2.7 mEq/L — CL (ref 3.5–5.1)
Potassium: 3.2 mEq/L — ABNORMAL LOW (ref 3.5–5.1)
Sodium: 136 mEq/L (ref 135–145)
Sodium: 137 mEq/L (ref 135–145)

## 2012-08-24 LAB — CBC
HCT: 29.8 % — ABNORMAL LOW (ref 36.0–46.0)
Hemoglobin: 9.9 g/dL — ABNORMAL LOW (ref 12.0–15.0)
MCH: 28.3 pg (ref 26.0–34.0)
MCHC: 33.2 g/dL (ref 30.0–36.0)
MCV: 85.1 fL (ref 78.0–100.0)
Platelets: 447 10*3/uL — ABNORMAL HIGH (ref 150–400)
RBC: 3.5 MIL/uL — ABNORMAL LOW (ref 3.87–5.11)
RDW: 12.7 % (ref 11.5–15.5)
WBC: 11.3 10*3/uL — ABNORMAL HIGH (ref 4.0–10.5)

## 2012-08-24 LAB — TROPONIN I
Troponin I: <0.3 ng/mL (ref <0.30)
Troponin I: <0.3 ng/mL (ref <0.30)
Troponin I: <0.3 ng/mL (ref <0.30)
Troponin I: <0.3 ng/mL (ref <0.30)

## 2012-08-24 LAB — HEMOGLOBIN A1C
Hgb A1c MFr Bld: 9.5 % — ABNORMAL HIGH (ref <5.7)
Mean Plasma Glucose: 226 mg/dL — ABNORMAL HIGH (ref <117)

## 2012-08-24 MED ORDER — HYDRALAZINE HCL 50 MG PO TABS
100.0000 mg | ORAL_TABLET | Freq: Two times a day (BID) | ORAL | Status: DC
  Administered 2012-08-24 (×3): 100 mg via ORAL
  Filled 2012-08-24 (×5): qty 2

## 2012-08-24 MED ORDER — LABETALOL HCL 5 MG/ML IV SOLN
5.0000 mg | INTRAVENOUS | Status: DC | PRN
  Administered 2012-08-25: 5 mg via INTRAVENOUS
  Filled 2012-08-24 (×2): qty 4

## 2012-08-24 MED ORDER — HYDROCOD POLST-CHLORPHEN POLST 10-8 MG/5ML PO LQCR
5.0000 mL | Freq: Once | ORAL | Status: AC
  Administered 2012-08-24: 5 mL via ORAL
  Filled 2012-08-24: qty 5

## 2012-08-24 MED ORDER — ACETAMINOPHEN 650 MG RE SUPP: 650.0000 mg | Freq: Four times a day (QID) | RECTAL | Status: DC | PRN

## 2012-08-24 MED ORDER — SODIUM CHLORIDE 0.9 % IJ SOLN: 3.0000 mL | Freq: Two times a day (BID) | INTRAMUSCULAR | Status: DC

## 2012-08-24 MED ORDER — INSULIN ASPART 100 UNIT/ML ~~LOC~~ SOLN
0.0000 [IU] | Freq: Three times a day (TID) | SUBCUTANEOUS | Status: DC
  Administered 2012-08-24 (×2): 3 [IU] via SUBCUTANEOUS

## 2012-08-24 MED ORDER — POTASSIUM CHLORIDE CRYS ER 20 MEQ PO TBCR
20.0000 meq | EXTENDED_RELEASE_TABLET | Freq: Two times a day (BID) | ORAL | Status: DC
  Administered 2012-08-24 – 2012-08-26 (×6): 20 meq via ORAL
  Filled 2012-08-24 (×7): qty 1

## 2012-08-24 MED ORDER — MORPHINE SULFATE 2 MG/ML IJ SOLN: 2.0000 mg | INTRAMUSCULAR | Status: DC | PRN

## 2012-08-24 MED ORDER — ONDANSETRON HCL 4 MG PO TABS: 4.0000 mg | ORAL_TABLET | Freq: Four times a day (QID) | ORAL | Status: DC | PRN

## 2012-08-24 MED ORDER — ACETAMINOPHEN 325 MG PO TABS: 650.0000 mg | ORAL_TABLET | Freq: Four times a day (QID) | ORAL | Status: DC | PRN

## 2012-08-24 MED ORDER — METOPROLOL TARTRATE 50 MG PO TABS
50.0000 mg | ORAL_TABLET | Freq: Two times a day (BID) | ORAL | Status: DC
  Administered 2012-08-24 – 2012-08-25 (×2): 50 mg via ORAL
  Filled 2012-08-24 (×3): qty 1

## 2012-08-24 MED ORDER — HYDROCODONE-ACETAMINOPHEN 5-325 MG PO TABS: 1.0000 | ORAL_TABLET | ORAL | Status: DC | PRN

## 2012-08-24 MED ORDER — MORPHINE SULFATE 2 MG/ML IJ SOLN
2.0000 mg | Freq: Once | INTRAMUSCULAR | Status: AC
  Administered 2012-08-24: 2 mg via INTRAVENOUS
  Filled 2012-08-24: qty 1

## 2012-08-24 MED ORDER — INSULIN ASPART PROT & ASPART (70-30 MIX) 100 UNIT/ML ~~LOC~~ SUSP
15.0000 [IU] | Freq: Two times a day (BID) | SUBCUTANEOUS | Status: DC
  Administered 2012-08-24 (×2): 15 [IU] via SUBCUTANEOUS
  Filled 2012-08-24: qty 10

## 2012-08-24 MED ORDER — SODIUM CHLORIDE 0.9 % IV SOLN: 250.0000 mL | INTRAVENOUS | Status: DC | PRN

## 2012-08-24 MED ORDER — POTASSIUM CHLORIDE 10 MEQ/100ML IV SOLN
10.0000 meq | INTRAVENOUS | Status: AC
  Administered 2012-08-24 (×3): 10 meq via INTRAVENOUS
  Filled 2012-08-24 (×3): qty 100

## 2012-08-24 MED ORDER — AMLODIPINE BESYLATE 10 MG PO TABS
10.0000 mg | ORAL_TABLET | Freq: Every day | ORAL | Status: DC
  Administered 2012-08-24 – 2012-08-25 (×2): 10 mg via ORAL
  Filled 2012-08-24 (×2): qty 1

## 2012-08-24 MED ORDER — SODIUM CHLORIDE 0.9 % IJ SOLN
3.0000 mL | Freq: Two times a day (BID) | INTRAMUSCULAR | Status: DC
  Administered 2012-08-24 – 2012-08-26 (×5): 3 mL via INTRAVENOUS

## 2012-08-24 MED ORDER — METOPROLOL TARTRATE 25 MG PO TABS
25.0000 mg | ORAL_TABLET | Freq: Once | ORAL | Status: AC
  Administered 2012-08-24: 25 mg via ORAL
  Filled 2012-08-24: qty 1

## 2012-08-24 MED ORDER — ONDANSETRON HCL 4 MG/2ML IJ SOLN: 4.0000 mg | Freq: Four times a day (QID) | INTRAMUSCULAR | Status: DC | PRN

## 2012-08-24 MED ORDER — FUROSEMIDE 10 MG/ML IJ SOLN
60.0000 mg | Freq: Two times a day (BID) | INTRAMUSCULAR | Status: DC
  Administered 2012-08-24 – 2012-08-25 (×3): 60 mg via INTRAVENOUS
  Filled 2012-08-24 (×5): qty 6

## 2012-08-24 MED ORDER — POTASSIUM CHLORIDE CRYS ER 20 MEQ PO TBCR
40.0000 meq | EXTENDED_RELEASE_TABLET | Freq: Once | ORAL | Status: AC
  Administered 2012-08-24: 40 meq via ORAL
  Filled 2012-08-24: qty 2

## 2012-08-24 MED ORDER — SPIRONOLACTONE 25 MG PO TABS
25.0000 mg | ORAL_TABLET | Freq: Every day | ORAL | Status: DC
  Administered 2012-08-24 – 2012-08-25 (×2): 25 mg via ORAL
  Filled 2012-08-24 (×2): qty 1

## 2012-08-24 MED ORDER — ASPIRIN 81 MG PO CHEW
81.0000 mg | CHEWABLE_TABLET | Freq: Every day | ORAL | Status: DC
  Administered 2012-08-24 – 2012-08-26 (×3): 81 mg via ORAL
  Filled 2012-08-24 (×3): qty 1

## 2012-08-24 MED ORDER — METOPROLOL TARTRATE 25 MG PO TABS
25.0000 mg | ORAL_TABLET | Freq: Two times a day (BID) | ORAL | Status: DC
  Administered 2012-08-24 (×2): 25 mg via ORAL
  Filled 2012-08-24 (×4): qty 1

## 2012-08-24 MED ORDER — SODIUM CHLORIDE 0.9 % IJ SOLN: 3.0000 mL | INTRAMUSCULAR | Status: DC | PRN

## 2012-08-24 MED ORDER — GLUCOSE-VITAMIN C 4-6 GM-MG PO CHEW
CHEWABLE_TABLET | ORAL | Status: AC
  Filled 2012-08-24: qty 1

## 2012-08-24 MED ORDER — ZOLPIDEM TARTRATE 5 MG PO TABS
5.0000 mg | ORAL_TABLET | Freq: Every evening | ORAL | Status: DC | PRN
  Filled 2012-08-24: qty 1

## 2012-08-24 MED ORDER — METFORMIN HCL 500 MG PO TABS
500.0000 mg | ORAL_TABLET | Freq: Two times a day (BID) | ORAL | Status: DC
  Administered 2012-08-24: 500 mg via ORAL
  Filled 2012-08-24 (×3): qty 1

## 2012-08-24 MED ORDER — ENOXAPARIN SODIUM 40 MG/0.4ML ~~LOC~~ SOLN
40.0000 mg | SUBCUTANEOUS | Status: DC
  Administered 2012-08-24 – 2012-08-26 (×3): 40 mg via SUBCUTANEOUS
  Filled 2012-08-24 (×3): qty 0.4

## 2012-08-24 MED ORDER — HYDRALAZINE HCL 100 MG PO TABS: 100.0000 mg | ORAL_TABLET | Freq: Two times a day (BID) | ORAL | Status: DC

## 2012-08-24 MED ORDER — SIMVASTATIN 40 MG PO TABS
40.0000 mg | ORAL_TABLET | Freq: Every evening | ORAL | Status: DC
  Administered 2012-08-24 – 2012-08-25 (×2): 40 mg via ORAL
  Filled 2012-08-24 (×3): qty 1

## 2012-08-24 NOTE — Progress Notes (Signed)
Patient ID: Destiny Day  female  [id number]    DOB: 1951/02/22    DOA: 08/23/2012  PCP: Salena Saner., MD  Assessment/Plan:   HTN (hypertension), malignant: likely precipitated by stress at home (grandson now living with her and husband, financial issues), dietary indiscretion. States takes medications and is compliant - Will increase BB, continue Lasix, aldactone , Norvasc, hydralazine   - Nutrition consult for diet education   Diastolic CHF, acute on chronic - Await 2-D echo, continue IV Lasix   DIABETES MELLITUS, TYPE II: Uncontrolled - On metformin, insulin, hemoglobin A1c 9.5   HYPERLIPIDEMIA: Continue Zocor    Hypokalemia: replaced  DVT Prophylaxis: lovenox  Code Status:  Disposition: DC tomorrow if stable, BP controlled    Subjective: Stressed out at home, no SI or HI, no CP, SOB, abd pain   Objective: Weight change:   Intake/Output Summary (Last 24 hours) at 08/24/12 1132 Last data filed at 08/24/12 1109  Gross per 24 hour  Intake    720 ml  Output   1500 ml  Net   -780 ml   Blood pressure 151/70, pulse 84, temperature 97.8 F (36.6 C), temperature source Oral, resp. rate 18, height 5\' 5"  (1.651 m), weight 69.4 kg (153 lb), SpO2 100.00%.  Physical Exam: General: Alert and awake, oriented x3, not in any acute distress. HEENT: anicteric sclera, pupils reactive to light and accommodation, EOMI CVS: S1-S2 clear, no murmur rubs or gallops Chest: clear to auscultation bilaterally, no wheezing, rales or rhonchi Abdomen: soft nontender, nondistended, normal bowel sounds, no organomegaly Extremities: no cyanosis, clubbing or edema noted bilaterally Neuro: Cranial nerves II-XII intact, no focal neurological deficits  Lab Results: Basic Metabolic Panel:  Lab [id number] 0235 08/23/12 2336  NA 136 137  K 3.2* 2.7*  CL 100 97  CO2 24 27  GLUCOSE 241* 295*  BUN 24* 24*  CREATININE 1.31* 1.35*  CALCIUM 8.6 8.7  MG -- --  PHOS -- --  CBC:  Lab  08/24/12 0235 08/23/12 2336  WBC 11.3* 11.2*  NEUTROABS -- 6.5  HGB 9.9* 10.6*  HCT 29.8* 31.1*  MCV 85.1 85.2  PLT 447* 464*   Cardiac Enzymes:  Lab 08/24/12 0907 08/24/12 0232 08/23/12 2335  CKTOTAL -- -- --  CKMB -- -- --  CKMBINDEX -- -- --  TROPONINI <0.30 <0.30 <0.30   BNP: No components found with this basename: POCBNP:2 CBG:  Lab 08/24/12 0643 08/24/12 0210  GLUCAP 192* 226*     Micro Results: No results found for this or any previous visit (from the past 240 hour(s)).  Studies/Results: Dg Chest Port 1 View  08/24/2012  *RADIOLOGY REPORT*  Clinical Data: Shortness of breath  PORTABLE CHEST - 1 VIEW  Comparison: 02/04/2012  Findings: Cardiomegaly.  Central vascular congestion.  Bilateral nodular and patchy airspace opacities. Small effusions not excluded.  No pneumothorax.  No acute osseous finding.  IMPRESSION: Bilateral nodular and patchy airspace opacities, favor edema or infection.  Recommend radiograph follow-up after therapy to document resolution/exclude underlying nodules.   Original Report Authenticated By: Carlos Levering, M.D.     Medications: Scheduled Meds:   . amLODipine  10 mg Oral Daily  . aspirin  81 mg Oral Daily  . enoxaparin (LOVENOX) injection  40 mg Subcutaneous Q24H  . furosemide  60 mg Intravenous Q12H  . hydrALAZINE  100 mg Oral BID  . insulin aspart  0-15 Units Subcutaneous TID WC  . insulin aspart protamine-insulin aspart  15 Units Subcutaneous BID  WC  . metFORMIN  500 mg Oral BID WC  . metoprolol tartrate  50 mg Oral BID  . potassium chloride  20 mEq Oral BID  . simvastatin  40 mg Oral QPM  . sodium chloride  3 mL Intravenous Q12H  . sodium chloride  3 mL Intravenous Q12H  . spironolactone  25 mg Oral Daily      LOS: 1 day   Destiny Day M.D. [facility] 08/24/2012, 11:32 AM Pager: [phone]  If 7PM-7AM, please contact night-coverage [email or web address] Password [email or web address]

## 2012-08-24 NOTE — Progress Notes (Signed)
Pt's blood sugar 61mg /dl at 1120 & 68mg /dl.& 109mg /dl at 1251.  Pt declined additional hypoglycemic med.  Stated I feel ok and will just eat my lunch and will be fine.  Pt remain asymptomatic.  Will continue to monitor.  Karie Kirks, Therapist, sports.

## 2012-08-24 NOTE — Plan of Care (Addendum)
Problem: Food- and Nutrition-Related Knowledge Deficit (NB-1.1) Goal: Nutrition education Formal process to instruct or train a patient/client in a skill or to impart knowledge to help patients/clients voluntarily manage or modify food choices and eating behavior to maintain or improve health.  Outcome: Completed/Met Date Met:  08/24/12 Body mass index is 25.46 kg/(m^2). - Overweight  PO intake good  (100% at breakfast)  Pt is 62 yo female admitted for exacerbation of congestive heart failure. Pt has PMH of diastolic congestive heart failure and T2DM.  Pt's DM is uncontrolled and pt reportedly noncompliant with fluid restriction.   RD consulted for diet education regarding sodium and fluid restriction. When speaking with pt, pt was aware of sodium restriction but confused about the numbers. Pt stated she was not adding extra salt to her foods. Pt stated she was unaware of her fluid restriction. RD provided pt with low sodium diet education handout from [facility] and handout with an example of a nutrition food label.  RD reviewied foods in pt's home diet that did not follow the low sodium diet.  Problem foods for pt include canned vegetables, soups, [place] bacon and fast food. RD suggested purchasing low sodium foods, frozen vegetables, rinsing canned vegetables.  Also discussed alternate ways to cook food (steam vegetables, bake chicken) as well as alternate ways to season foods (mrs. Dash, herbs, spices).  RD clarified 1500 mg sodium limit and how to read food labels. Emphasized that serving size, and servings per container must be taken into consideration. RD pointed out that any food with more than 300 mg of sodium was not a good option.  Lastly, RD reviewed fluid restriction of 2L with pt. Made sure pt was aware that any food that is liquid at room temperature must be included in her 2L restriction.    Pt seemed curious and asked questions when she needed clarification. Pt seemed willing to make some of the  suggested changes but also a bit overwhelmed. Pt especially surprised to learn about the fluid restriction.    Body mass index is 25.46 kg/(m^2). overweight Diet: Carb Mod Low PO intake: 100%  No other nutrition interventions necessary at this time. Please consult RD if needed.          Orson Slick RD, LDN Pager [phone] After Hours pager [phone]

## 2012-08-24 NOTE — Plan of Care (Signed)
Problem: Food- and Nutrition-Related Knowledge Deficit (NB-1.1) Goal: Nutrition education Formal process to instruct or train a patient/client in a skill or to impart knowledge to help patients/clients voluntarily manage or modify food choices and eating behavior to maintain or improve health. Outcome: Completed/Met Date Met:  08/24/12 RD consulted for low sodium and fluid restriction diet education.  RD reviewed food high in sodium in pt's diet. RD also reviewed how to read a food label and what foods high in sodium to avoid.  Also reviewed fluid restriction and informed pt to count foods liquids at room temp.

## 2012-08-24 NOTE — Progress Notes (Signed)
Notified Melissa in echo that pt can travel off tele and ready to be picked up.  Stated ok.  Karie Kirks, Therapist, sports.

## 2012-08-24 NOTE — Progress Notes (Signed)
Utilization review completed.  P.J. Jacquette Canales,RN,BSN Case Manager [phone]  

## 2012-08-24 NOTE — H&P (Signed)
[facility]                                                                                    Patient Demographics  Destiny Day, is a 62 y.o. female  CSN: [id number]  MRN: [id number]  DOB - 01-27-1951  Admit Date - 08/23/2012  Outpatient Primary MD for the patient is Salena Saner., MD   With History of -  Past Medical History  Diagnosis Date  . CHF (congestive heart failure)   . Diabetes mellitus   . Hypertension   . Diverticulitis   . Hyperlipidemia       Past Surgical History  Procedure Date  . Abdominal hysterectomy     in for   Chief Complaint  Patient presents with  . Cough  . Chest Pain  . Shortness of Breath     HPI  Destiny Day  is a 62 y.o. female,  with past medical history significant for diastolic congestive heart failure presenting with a one-week history of cough with frothy sputum. Her primary medical doctor prescribed her Z-Pak last week. Today she became more dyspneic at night. No chest pain, had no fever but chills reported yesterday. No nausea vomiting, no history of leg swelling. Patient is noncompliant with fluid intake.    Review of Systems    In addition to the HPI above,  Had chills yesterday, No Headache, No changes with Vision or hearing, No problems swallowing food or Liquids, No Chest pain, No Abdominal pain, No Nausea or Vommitting, Bowel movements are regular, No Blood in stool or Urine, No dysuria, No new skin rashes or bruises, No new joints pains-aches,  No new weakness, tingling, numbness in any extremity, No recent weight gain or loss, No polyuria, polydypsia or polyphagia, No significant Mental Stressors.  A full 10 point Review of Systems was done, except as stated above, all other Review of Systems were negative.   Social History History  Substance Use Topics  . Smoking status: Former Research scientist (life sciences)  . Smokeless tobacco: Never Used  . Alcohol Use: No     Family History Diabetes  mellitus and hypertension run in her family  Prior to Admission medications   Medication Sig Start Date End Date Taking? Authorizing Provider  acetaminophen (TYLENOL) 500 MG tablet Take 500 mg by mouth every 6 (six) hours as needed. For pain/fever   Yes Historical Provider, MD  amLODipine (NORVASC) 10 MG tablet Take 10 mg by mouth daily.   Yes Historical Provider, MD  aspirin 81 MG chewable tablet Chew 81 mg by mouth daily.   Yes Historical Provider, MD  furosemide (LASIX) 40 MG tablet Take 40 mg by mouth 2 (two) times daily.   Yes Historical Provider, MD  hydrALAZINE (APRESOLINE) 100 MG tablet Take 100 mg by mouth 2 (two) times daily.   Yes Historical Provider, MD  insulin NPH-insulin regular (NOVOLIN 70/30) (70-30) 100 UNIT/ML injection Inject 15-20 Units into the skin 2 (two) times daily with a meal. Use 20 units in the morning Use 15 units in the evening   Yes Historical Provider, MD  metFORMIN (GLUCOPHAGE) 500 MG tablet Take 500 mg by mouth 2 (two) times  daily with a meal.   Yes Historical Provider, MD  potassium chloride SA (K-DUR,KLOR-CON) 20 MEQ tablet Take 20 mEq by mouth daily.   Yes Historical Provider, MD  simvastatin (ZOCOR) 40 MG tablet Take 40 mg by mouth every evening.   Yes Historical Provider, MD  spironolactone (ALDACTONE) 25 MG tablet Take 25 mg by mouth daily.   Yes Historical Provider, MD  chlorpheniramine-HYDROcodone (TUSSIONEX PENNKINETIC ER) 10-8 MG/5ML LQCR Take 5 mLs by mouth every 12 (twelve) hours. Take 5 mLs by mouth every 12 (twelve) hours. 10/07/11   Martie Lee, PA  hydrALAZINE (APRESOLINE) 100 MG tablet Take 100 mg by mouth every 8 (eight) hours. 08/23/11 08/22/12  Wenda Low, MD    Allergies  Allergen Reactions  . Lisinopril Cough    Physical Exam  Vitals  Blood pressure 186/91, pulse 94, temperature 97.7 F (36.5 C), temperature source Oral, resp. rate 20, SpO2 99.00%.   1. General African American female in moderate respiratory distress  2.  Normal affect and insight, Not Suicidal or Homicidal, Awake Alert, Oriented X 3.  3. No F.N deficits, ALL C.Nerves Intact, Strength 5/5 all 4 extremities, Sensation intact all 4 extremities, Plantars down going.  4. Ears and Eyes appear Normal, Conjunctivae clear, PERRLA. Moist Oral Mucosa.  5. Supple Neck, mild JVD, No cervical lymphadenopathy appriciated, No Carotid Bruits.  6. Symmetrical Chest wall movement, Good air movement bilaterally, bilateral basal crackles.  7. RRR, No Gallops, Rubs or Murmurs, No Parasternal Heave.  8. Positive Bowel Sounds, Abdomen Soft, Non tender, No organomegaly appriciated,No rebound -guarding or rigidity.  9.  No Cyanosis, Normal Skin Turgor, No Skin Rash or Bruise.  10. Good muscle tone,  joints appear normal , no effusions, Normal ROM.  11. No Palpable Lymph Nodes in Neck or Axillae  12. Extremities: No clubbing cyanosis or edema.    Data Review  CBC  Lab 08/23/12 2336  WBC 11.2*  HGB 10.6*  HCT 31.1*  PLT 464*  MCV 85.2  MCH 29.0  MCHC 34.1  RDW 12.8  LYMPHSABS 3.6  MONOABS 0.7  EOSABS 0.4  BASOSABS 0.0  BANDABS --   ------------------------------------------------------------------------------------------------------------------  Chemistries   Lab 08/23/12 2336  NA 137  K 2.7*  CL 97  CO2 27  GLUCOSE 295*  BUN 24*  CREATININE 1.35*  CALCIUM 8.7  MG --  AST --  ALT --  ALKPHOS --  BILITOT --   ------------------------------------------------------------------------------------------------------------------ -------------------------------------------------------------------------------------------------------------------  Cardiac Enzymes  Lab 08/23/12 2335  CKMB --  TROPONINI <0.30  MYOGLOBIN --   ------------------------------------------------------------------------------------------------------------------ No components found with this basename:  POCBNP:3   ---------------------------------------------------------------------------------------------------------------  Urinalysis    Component Value Date/Time   COLORURINE YELLOW 02/05/2012 0014   APPEARANCEUR CLEAR 02/05/2012 0014   LABSPEC 1.014 02/05/2012 0014   PHURINE 6.5 02/05/2012 0014   GLUCOSEU 250* 02/05/2012 0014   HGBUR SMALL* 02/05/2012 0014   BILIRUBINUR NEGATIVE 02/05/2012 0014   KETONESUR NEGATIVE 02/05/2012 0014   PROTEINUR >300* 02/05/2012 0014   UROBILINOGEN 1.0 02/05/2012 0014   NITRITE NEGATIVE 02/05/2012 0014   LEUKOCYTESUR NEGATIVE 02/05/2012 0014    ----------------------------------------------------------------------------------------------------------------     Imaging results:   Dg Chest Port 1 View  08/24/2012  *RADIOLOGY REPORT*  Clinical Data: Shortness of breath  PORTABLE CHEST - 1 VIEW  Comparison: 02/04/2012  Findings: Cardiomegaly.  Central vascular congestion.  Bilateral nodular and patchy airspace opacities. Small effusions not excluded.  No pneumothorax.  No acute osseous finding.  IMPRESSION: Bilateral nodular and patchy airspace  opacities, favor edema or infection.  Recommend radiograph follow-up after therapy to document resolution/exclude underlying nodules.   Original Report Authenticated By: Carlos Levering, M.D.     My personal review of EKG: Sinus tachycardia at a heart rate of 141 with by at the radial enlargement and possible incomplete left bundle branch block.  Personally reviewed Old Chart from 08/23/2011  Assessment & Plan  1. acute exacerbation of congestive heart failure. Patient has history of diastolic congestive heart failure with last echocardiogram done on 08/23/2011 showing a grade 3 diastolic congestive heart failure with 55-60% ejection fraction 2. Urgent hypertension 3. Hypokalemia 3 potassium IV riders ordered in the ER  4. Diabetes mellitus 2 not controlled on insulin  5. Questionable lung nodules to be followed  after congestive heart failure episode resolves.  Plan Admit to telemetry IV Lasix Recheck her echo By mouth Lopressor Continue her medications Morphine sulfate. Consult Dr. Everitt Amber. In  AM. 4 congestive heart failure Serial troponins  replace potassium and check level today at 6 AM   DVT Prophylaxis Lovenox  AM Labs Ordered, also please review Full Orders  Family Communication: Admission, patients condition and plan of care including tests being ordered have been discussed with the patient and husband who indicate understanding and agree with the plan and Code Status.  Code Status full  Disposition Plan: Admit to telemetry then discharge home when stable  Time spent in minutes : 50 minutes  Condition GUARDED

## 2012-08-24 NOTE — Progress Notes (Signed)
Dr. Tana Coast returned call r/t pt's blood sugar being 61, 68 and 109mg .  Instructed will d/c pt's metformin.  Will continue to monitor.  Karie Kirks, Therapist, sports.

## 2012-08-24 NOTE — Progress Notes (Signed)
  Echocardiogram 2D Echocardiogram has been performed.  Destiny Day 08/24/2012, 1:59 PM

## 2012-08-25 ENCOUNTER — Inpatient Hospital Stay (HOSPITAL_COMMUNITY): Payer: BC Managed Care – PPO

## 2012-08-25 ENCOUNTER — Encounter (HOSPITAL_COMMUNITY): Payer: Self-pay | Admitting: General Practice

## 2012-08-25 DIAGNOSIS — I10 Essential (primary) hypertension: Secondary | ICD-10-CM

## 2012-08-25 DIAGNOSIS — E785 Hyperlipidemia, unspecified: Secondary | ICD-10-CM

## 2012-08-25 LAB — BASIC METABOLIC PANEL
BUN: 27 mg/dL — ABNORMAL HIGH (ref 6–23)
CO2: 26 mEq/L (ref 19–32)
Calcium: 9.3 mg/dL (ref 8.4–10.5)
Chloride: 97 mEq/L (ref 96–112)
Creatinine, Ser: 1.39 mg/dL — ABNORMAL HIGH (ref 0.50–1.10)
GFR calc Af Amer: 46 mL/min — ABNORMAL LOW (ref >90)
GFR calc non Af Amer: 40 mL/min — ABNORMAL LOW (ref >90)
Glucose, Bld: 280 mg/dL — ABNORMAL HIGH (ref 70–99)
Potassium: 4.3 mEq/L (ref 3.5–5.1)
Sodium: 137 mEq/L (ref 135–145)

## 2012-08-25 LAB — HEMOGLOBIN A1C
Hgb A1c MFr Bld: 9.3 % — ABNORMAL HIGH (ref <5.7)
Mean Plasma Glucose: 220 mg/dL — ABNORMAL HIGH (ref <117)

## 2012-08-25 LAB — GLUCOSE, CAPILLARY
Glucose-Capillary: 136 mg/dL — ABNORMAL HIGH (ref 70–99)
Glucose-Capillary: 264 mg/dL — ABNORMAL HIGH (ref 70–99)
Glucose-Capillary: 102 mg/dL — ABNORMAL HIGH (ref 70–99)
Glucose-Capillary: 74 mg/dL (ref 70–99)
Glucose-Capillary: 184 mg/dL — ABNORMAL HIGH (ref 70–99)

## 2012-08-25 MED ORDER — BENZONATATE 100 MG PO CAPS
100.0000 mg | ORAL_CAPSULE | Freq: Three times a day (TID) | ORAL | Status: DC
  Administered 2012-08-25 – 2012-08-26 (×4): 100 mg via ORAL
  Filled 2012-08-25 (×7): qty 1

## 2012-08-25 MED ORDER — AMLODIPINE BESYLATE 10 MG PO TABS
10.0000 mg | ORAL_TABLET | Freq: Every day | ORAL | Status: DC
  Administered 2012-08-26: 10 mg via ORAL
  Filled 2012-08-25: qty 1

## 2012-08-25 MED ORDER — AMLODIPINE BESYLATE-VALSARTAN 10-320 MG PO TABS: 1.0000 | ORAL_TABLET | Freq: Every day | ORAL | Status: DC

## 2012-08-25 MED ORDER — HYDRALAZINE HCL 50 MG PO TABS
100.0000 mg | ORAL_TABLET | Freq: Three times a day (TID) | ORAL | Status: DC
  Administered 2012-08-25 – 2012-08-26 (×4): 100 mg via ORAL
  Filled 2012-08-25 (×8): qty 2

## 2012-08-25 MED ORDER — IRBESARTAN 300 MG PO TABS
300.0000 mg | ORAL_TABLET | Freq: Every day | ORAL | Status: DC
  Administered 2012-08-25 – 2012-08-26 (×2): 300 mg via ORAL
  Filled 2012-08-25 (×2): qty 1

## 2012-08-25 MED ORDER — FUROSEMIDE 40 MG PO TABS
60.0000 mg | ORAL_TABLET | Freq: Two times a day (BID) | ORAL | Status: DC
  Administered 2012-08-25 – 2012-08-26 (×3): 60 mg via ORAL
  Filled 2012-08-25 (×6): qty 1

## 2012-08-25 MED ORDER — INSULIN ASPART 100 UNIT/ML ~~LOC~~ SOLN
0.0000 [IU] | Freq: Three times a day (TID) | SUBCUTANEOUS | Status: DC
  Administered 2012-08-25: 13:00:00 via SUBCUTANEOUS
  Administered 2012-08-26: 1 [IU] via SUBCUTANEOUS

## 2012-08-25 MED ORDER — INSULIN ASPART 100 UNIT/ML ~~LOC~~ SOLN: 0.0000 [IU] | Freq: Every day | SUBCUTANEOUS | Status: DC

## 2012-08-25 MED ORDER — HYDROCOD POLST-CHLORPHEN POLST 10-8 MG/5ML PO LQCR
5.0000 mL | Freq: Two times a day (BID) | ORAL | Status: DC
  Administered 2012-08-25 – 2012-08-26 (×3): 5 mL via ORAL
  Filled 2012-08-25 (×3): qty 5

## 2012-08-25 MED ORDER — CARVEDILOL 25 MG PO TABS
25.0000 mg | ORAL_TABLET | Freq: Two times a day (BID) | ORAL | Status: DC
  Administered 2012-08-25 – 2012-08-26 (×2): 25 mg via ORAL
  Filled 2012-08-25 (×4): qty 1

## 2012-08-25 MED ORDER — CLONIDINE HCL 0.2 MG/24HR TD PTWK
0.2000 mg | MEDICATED_PATCH | TRANSDERMAL | Status: DC
  Administered 2012-08-25: 0.2 mg via TRANSDERMAL
  Filled 2012-08-25: qty 1

## 2012-08-25 MED ORDER — INSULIN ASPART PROT & ASPART (70-30 MIX) 100 UNIT/ML ~~LOC~~ SUSP
10.0000 [IU] | Freq: Two times a day (BID) | SUBCUTANEOUS | Status: DC
  Filled 2012-08-25: qty 10

## 2012-08-25 NOTE — Progress Notes (Signed)
1/30 Spoke with patient about her diabetes. States that she has had diabetes for a long time.  Was on Lantus and Novolog before she retired, but had to change to 70/30 mix insulin due to cost.  Sees Dr. Karlton Lemon for her PCP.  States that she may have a low blood sugar in the evening every other day.  Does not check her blood sugars the way she should....usually checks only in the morning because she is saving strips due to her set income.  Suggested the Reli-on meter and strips from [employer] which is much cheaper than the one she has. Left a prescription form for a home blood glucose meter on the shadow chart to be signed by physician.  Will give patient information on sample meals to prepare.  Will continue to follow while in hospital.  Recommend a lower dosage of 70/30 until she can be seen by her PCP.  Does know S/S of hypoglycemia and how to treat.   Harvel Ricks RN BSN CDE

## 2012-08-25 NOTE — Progress Notes (Signed)
Pt had a CBG 42, gave OJ, crackers&PB, fruit cup, rechecked 79, gave another fruit cup due to prevent it from dropping significantly again, rechecked about an hour later and went to 174, will continue to monitor, Thanks, Arvella Nigh RN

## 2012-08-25 NOTE — Progress Notes (Signed)
Patient ID: Destiny Day  female  [id number]    DOB: [birth year]    DOA: 08/23/2012  PCP: Salena Saner., MD  Assessment/Plan:   HTN (hypertension), malignant: likely precipitated by stress at home (grandson now living with her and husband, financial issues), dietary indiscretion and medication noncompliance (patient is confused between what medication she is supposed to take, she has been to a different medical practices with the last one month) - Spoke to Dr. Einar Gip (patient's cardiologist), and was found to be on entirely different antihypertensives that she is on inpatient. Discussed with pharmacy and highly appreciate for changing the medications. DC'ed the current medications and restarted the medications as per recommendations from Dr Einar Gip.    - CXR shows improved patchy bilateral opacities, now favoring pulmonary edema, changed lasix to oral today. - She is not supposed to be on aldactone per Dr Irven Shelling recommendations but was taking it. I have requested Dr Einar Gip to follow-up inpatient, her BP is till very uncontrolled.    - Nutrition consult for diet education obtained.    Diastolic CHF, acute on chronic - 2-D echo done,EF [id number], grade 2 diastolic dysfunction, cont lasix   DIABETES MELLITUS, TYPE II: Uncontrolled with hypoglycemia episodes, again non-compliant at home does not check her blood sugars, has been having low blood sugar at home as well. hemoglobin A1c 9.5 - Discontinued metformin, reduce the sliding scale coverage to sensitive - Decreased NovoLog 70/30 to 10 units BID (was on 15units BID inpatient. at home she is 20units qam, 15units qpm)    HYPERLIPIDEMIA: Continue Zocor    Hypokalemia: RESOLVED  DVT Prophylaxis: lovenox  Code Status:  Disposition: DC tomorrow if BP controlled, bs are stable    Subjective: Denies any chest pain, shortness of breath abdomen pain. Multiple hypoglycemia episodes.   Objective: Weight change: 1.089 kg (2 lb 6.4  oz)  Intake/Output Summary (Last 24 hours) at 08/25/12 1443 Last data filed at 08/25/12 1200  Gross per 24 hour  Intake   1092 ml  Output   2000 ml  Net   -908 ml   Blood pressure 172/83, pulse 78, temperature 98 F (36.7 C), temperature source Oral, resp. rate 20, height 5\' 5"  (1.651 m), weight 70.489 kg (155 lb 6.4 oz), SpO2 100.00%.  Physical Exam: General: Alert and awake, oriented x3, not in any acute distress. HEENT: anicteric sclera, pupils reactive to light and accommodation, EOMI CVS: S1-S2 clear, no murmur rubs or gallops Chest: clear to auscultation bilaterally, no wheezing, rales or rhonchi Abdomen: soft nontender, nondistended, normal bowel sounds, no organomegaly Extremities: no cyanosis, clubbing or edema noted bilaterally Neuro: Cranial nerves II-XII intact, no focal neurological deficits  Lab Results: Basic Metabolic Panel:  Lab [id number] 0912 08/24/12 0235  NA 137 136  K 4.3 3.2*  CL 97 100  CO2 26 24  GLUCOSE 280* 241*  BUN 27* 24*  CREATININE 1.39* 1.31*  CALCIUM 9.3 8.6  MG -- --  PHOS -- --  CBC:  Lab 08/24/12 0235 08/23/12 2336  WBC 11.3* 11.2*  NEUTROABS -- 6.5  HGB 9.9* 10.6*  HCT 29.8* 31.1*  MCV 85.1 85.2  PLT 447* 464*   Cardiac Enzymes:  Lab 08/24/12 1420 08/24/12 0907 08/24/12 0232  CKTOTAL -- -- --  CKMB -- -- --  CKMBINDEX -- -- --  TROPONINI <0.30 <0.30 <0.30   BNP: No components found with this basename: POCBNP:2 CBG:  Lab 08/25/12 0601 08/24/12 2246 08/24/12 2138 08/24/12 2106 08/24/12 2104  GLUCAP 136* 174*  79 39* 42*     Micro Results: No results found for this or any previous visit (from the past 240 hour(s)).  Studies/Results: Dg Chest Port 1 View  08/24/2012  *RADIOLOGY REPORT*  Clinical Data: Shortness of breath  PORTABLE CHEST - 1 VIEW  Comparison: 02/04/2012  Findings: Cardiomegaly.  Central vascular congestion.  Bilateral nodular and patchy airspace opacities. Small effusions not excluded.  No pneumothorax.   No acute osseous finding.  IMPRESSION: Bilateral nodular and patchy airspace opacities, favor edema or infection.  Recommend radiograph follow-up after therapy to document resolution/exclude underlying nodules.   Original Report Authenticated By: Carlos Levering, M.D.     Medications: Scheduled Meds:    . amLODipine  10 mg Oral Daily  . aspirin  81 mg Oral Daily  . benzonatate  100 mg Oral TID  . carvedilol  25 mg Oral BID WC  . chlorpheniramine-HYDROcodone  5 mL Oral Q12H  . cloNIDine  0.2 mg Transdermal Weekly  . enoxaparin (LOVENOX) injection  40 mg Subcutaneous Q24H  . furosemide  60 mg Oral BID  . hydrALAZINE  100 mg Oral Q8H  . insulin aspart  0-5 Units Subcutaneous QHS  . insulin aspart  0-9 Units Subcutaneous TID WC  . insulin aspart protamine-insulin aspart  10 Units Subcutaneous BID WC  . irbesartan  300 mg Oral Daily  . potassium chloride  20 mEq Oral BID  . simvastatin  40 mg Oral QPM  . sodium chloride  3 mL Intravenous Q12H  . sodium chloride  3 mL Intravenous Q12H      LOS: 2 days   RAI,RIPUDEEP M.D. [facility] 08/25/2012, 2:43 PM Pager: [phone]  If 7PM-7AM, please contact night-coverage [email or web address] Password [email or web address]

## 2012-08-25 NOTE — Progress Notes (Signed)
1/30 Given Living Well with Diabetes booklet and CHO counting information folder.  States that she does not need to see a dietician.  Suggested that she change a couple of things with her eating over the next 6-8 weeks such as changing to diet soda and using sugar substitute in her tea.  Husband was with her.  Had some good questions to answer.  Will need follow up with her PCP at discharge.  Harvel Ricks RN BSN CDe

## 2012-08-25 NOTE — Plan of Care (Signed)
Problem: Phase II Progression Outcomes Goal: Discharge plan established Outcome: Completed/Met Date Met:  08/25/12 Discharge to home with spouse.  Drives self to MD appts.  Denied problems with purchasing medications.

## 2012-08-26 DIAGNOSIS — R7309 Other abnormal glucose: Secondary | ICD-10-CM

## 2012-08-26 DIAGNOSIS — J45909 Unspecified asthma, uncomplicated: Secondary | ICD-10-CM

## 2012-08-26 LAB — GLUCOSE, CAPILLARY: Glucose-Capillary: 138 mg/dL — ABNORMAL HIGH (ref 70–99)

## 2012-08-26 MED ORDER — BENZONATATE 100 MG PO CAPS: 100.0000 mg | ORAL_CAPSULE | Freq: Three times a day (TID) | ORAL | Status: DC

## 2012-08-26 MED ORDER — AMLODIPINE BESYLATE-VALSARTAN 10-320 MG PO TABS: 1.0000 | ORAL_TABLET | Freq: Every day | ORAL | Status: DC

## 2012-08-26 MED ORDER — INSULIN ASPART PROT & ASPART (70-30 MIX) 100 UNIT/ML ~~LOC~~ SUSP: 8.0000 [IU] | Freq: Two times a day (BID) | SUBCUTANEOUS | Status: DC

## 2012-08-26 MED ORDER — CLONIDINE HCL 0.2 MG/24HR TD PTWK: 1.0000 | MEDICATED_PATCH | TRANSDERMAL | Status: DC

## 2012-08-26 MED ORDER — INSULIN NPH ISOPHANE & REGULAR (70-30) 100 UNIT/ML ~~LOC~~ SUSP: 8.0000 [IU] | Freq: Two times a day (BID) | SUBCUTANEOUS | Status: DC

## 2012-08-26 MED ORDER — HYDRALAZINE HCL 100 MG PO TABS: 100.0000 mg | ORAL_TABLET | Freq: Three times a day (TID) | ORAL | Status: DC

## 2012-08-26 MED ORDER — CARVEDILOL 25 MG PO TABS: 25.0000 mg | ORAL_TABLET | Freq: Two times a day (BID) | ORAL | Status: DC

## 2012-08-26 MED ORDER — FUROSEMIDE 40 MG PO TABS: 40.0000 mg | ORAL_TABLET | Freq: Two times a day (BID) | ORAL | Status: DC

## 2012-08-26 NOTE — Discharge Summary (Signed)
Physician Discharge Summary  Patient ID: Destiny [name] MRN: [id number] DOB/AGE: October 04, 1950 62 y.o.  Admit date: 08/23/2012 Discharge date: 08/26/2012  Primary Care Physician:  Salena Saner., MD  Discharge Diagnoses:    . Hypokalemia . Diastolic CHF, acute on chronic- now compensated  . HTN (hypertension), malignant . DIABETES MELLITUS, TYPE II- uncontrolled  . HYPERLIPIDEMIA  Consults: Cardiology, Dr. Einar Gip   Discharge Medications:   Medication List     As of 08/26/2012 10:26 AM    STOP taking these medications         metFORMIN 500 MG tablet   Commonly known as: GLUCOPHAGE      spironolactone 25 MG tablet   Commonly known as: ALDACTONE      TAKE these medications         acetaminophen 500 MG tablet   Commonly known as: TYLENOL   Take 500 mg by mouth every 6 (six) hours as needed. For pain/fever      amLODipine-valsartan 10-320 MG per tablet   Commonly known as: EXFORGE   Take 1 tablet by mouth daily.      aspirin 81 MG chewable tablet   Chew 81 mg by mouth daily.      benzonatate 100 MG capsule   Commonly known as: TESSALON   Take 1 capsule (100 mg total) by mouth 3 (three) times daily. X 10 days      carvedilol 25 MG tablet   Commonly known as: COREG   Take 1 tablet (25 mg total) by mouth 2 (two) times daily with a meal.      chlorpheniramine-HYDROcodone 10-8 MG/5ML Lqcr   Commonly known as: TUSSIONEX   Take 5 mLs by mouth every 12 (twelve) hours. Take 5 mLs by mouth every 12 (twelve) hours.      cloNIDine 0.2 mg/24hr patch   Commonly known as: CATAPRES - Dosed in mg/24 hr   Place 1 patch (0.2 mg total) onto the skin once a week. On Thursdays      furosemide 40 MG tablet   Commonly known as: LASIX   Take 1 tablet (40 mg total) by mouth 2 (two) times daily.      hydrALAZINE 100 MG tablet   Commonly known as: APRESOLINE   Take 1 tablet (100 mg total) by mouth 3 (three) times daily.      insulin NPH-insulin regular (70-30) 100 UNIT/ML  injection   Commonly known as: NOVOLIN 70/30   Inject 8 Units into the skin 2 (two) times daily with a meal.      potassium chloride SA 20 MEQ tablet   Commonly known as: K-DUR,KLOR-CON   Take 20 mEq by mouth daily.      simvastatin 40 MG tablet   Commonly known as: ZOCOR   Take 40 mg by mouth every evening.         Brief H and P: For complete details please refer to admission H and P, but in brief Destiny [name] is a 62 y.o. female, with past medical history significant for diastolic congestive heart failure presented with a one-week history of cough with frothy sputum. Her primary medical doctor prescribed her Z-Pak last week. On the day of admission she had become more dyspneic at night but she denied any chest pain, had no fever but chills. No nausea vomiting, no history of leg swelling. Patient is noncompliant with fluid intake   Hospital Course:  HTN (hypertension), malignant: likely precipitated by stress at home (grandson now living with her and  husband, financial issues), dietary indiscretion and medication noncompliance (patient is confused between what medication she is supposed to take, she has been to a different medical practices with the last one month)  Cardiology was consulted and patient was seen by Dr. Einar Gip (patient's cardiologist). Patient's medications were adjusted and changed as there was significant confusion as to what she was actually taking due to change in medical practices within last month. Patient was started on IV Lasix at the time of admission, chest x-ray was obtained and showed improved patchy bilateral opacities, favoring pulmonary edema. Lasix was transitioned to oral and will be resumed on home dose. She is not supposed to be on aldactone per Dr Irven Shelling recommendations but was taking it. Nutrition consult for diet education was obtained. Patient is also extremely noncompliant with her diet and medications hands home health nurse was arranged to review  her pill box and medication changes.  Diastolic CHF, acute on chronic: Improved, 2-D echo was done which showed EF [id number], grade 2 diastolic dysfunction, cont lasix   DIABETES MELLITUS, TYPE II: Uncontrolled with hypoglycemia episodes inpatient, again non-compliant at home does not check her blood sugars, has been having low blood sugar at home as well. hemoglobin A1c 9.5. Metformin was discontinued and decreased NovoLog 70/30 to 8 units BID at discharge (was on 15units BID inpatient. at home she is 20units qam, 15units qpm). Patient was instructed to at least check the blood sugars twice daily and take the log to her primary care physician on appointment for further adjustments in the insulin regimen. Prescription was provided for glucometer.   HYPERLIPIDEMIA: Continue Zocor  Hypokalemia: RESOLVED    [name] of Discharge BP 143/70  Pulse 77  Temp 97.9 F (36.6 C) (Oral)  Resp 18  Ht 5\' 5"  (1.651 m)  Wt 69.31 kg (152 lb 12.8 oz)  BMI 25.43 kg/m2  SpO2 100%  Physical Exam: General: Alert and awake oriented x3 not in any acute distress. HEENT: anicteric sclera, pupils reactive to light and accommodation CVS: S1-S2 clear no murmur rubs or gallops Chest: clear to auscultation bilaterally, no wheezing rales or rhonchi Abdomen: soft nontender, nondistended, normal bowel sounds, no organomegaly Extremities: no cyanosis, clubbing or edema noted bilaterally Neuro: Cranial nerves II-XII intact, no focal neurological deficits   The results of significant diagnostics from this hospitalization (including imaging, microbiology, ancillary and laboratory) are listed below for reference.    LAB RESULTS: Basic Metabolic Panel:  Lab [id number] 08/24/12 0235  NA 137 136  K 4.3 3.2*  CL 97 100  CO2 26 24  GLUCOSE 280* 241*  BUN 27* 24*  CREATININE 1.39* 1.31*  CALCIUM 9.3 8.6  MG -- --  PHOS -- --   CBC:  Lab 08/24/12 0235 08/23/12 2336  WBC 11.3* 11.2*  NEUTROABS -- 6.5  HGB 9.9*  10.6*  HCT 29.8* 31.1*  MCV 85.1 --  PLT 447* 464*   Cardiac Enzymes:  Lab 08/24/12 1420 08/24/12 0907  CKTOTAL -- --  CKMB -- --  CKMBINDEX -- --  TROPONINI <0.30 <0.30   BNP: No components found with this basename: POCBNP:2 CBG:  Lab 08/26/12 0622 08/25/12 1956  GLUCAP 138* 184*    Significant Diagnostic Studies:  Dg Chest Port 1 View  08/24/2012  *RADIOLOGY REPORT*  Clinical Data: Shortness of breath  PORTABLE CHEST - 1 VIEW  Comparison: 02/04/2012  Findings: Cardiomegaly.  Central vascular congestion.  Bilateral nodular and patchy airspace opacities. Small effusions not excluded.  No pneumothorax.  No  acute osseous finding.  IMPRESSION: Bilateral nodular and patchy airspace opacities, favor edema or infection.  Recommend radiograph follow-up after therapy to document resolution/exclude underlying nodules.   Original Report Authenticated By: Carlos Levering, M.D.      Disposition and Follow-up:     Discharge Orders    Future Orders Please Complete By Expires   Diet Carb Modified      Increase activity slowly      Discharge instructions      Comments:   1) Please review all your medications, several changes have been made according to your cardiologist's instructions. 2) stop metformin, decrease NovoLog 70/30 insulin to 8units twice a day with meals       DISPOSITION: Home DIET: Carb modified diet, low salt ACTIVITY: As tolerated  DISCHARGE FOLLOW-UP Follow-up Information    Follow up with Laverda Page, MD. On 09/02/2012. (12 noon. Arrive 15 min early. Bring all medications with you.)    Contact information:   [address] [place] [address] [phone]       Follow up with Salena Saner., MD. Schedule an appointment as soon as possible for a visit in 10 days. (for your diabetes. check your BS twice a day and take your log to the appt.)    Contact information:   [address] [place] [address] [phone]           Time spent on Discharge: 38 minutes  Signed:   RAI,RIPUDEEP M.D. [facility] 08/26/2012, 10:26 AM Pager: [phone]

## 2012-08-26 NOTE — Care Management Note (Signed)
    Page 1 of 1   08/26/2012     11:50:03 AM   CARE MANAGEMENT NOTE 08/26/2012  Patient:  Destiny [name]   Account Number:  [id number]  Date Initiated:  08/26/2012  Documentation initiated by:  Marvetta Gibbons  Subjective/Objective Assessment:   Pt admitted with C/P, DM     Action/Plan:   PTA pt lived at home with spouse-   Anticipated DC Date:  08/26/2012   Anticipated DC Plan:  [place]  CM consult      [facility] Choice  HOME HEALTH   Choice offered to / List presented to:  C-1 Patient        [place] arranged  [place] - 11 Patient Refused      Status of service:  Completed, signed off Medicare Important Message given?   (If response is "NO", the following Medicare IM given date fields will be blank) Date Medicare IM given:   Date Additional Medicare IM given:    Discharge Disposition:  HOME/SELF CARE  Per UR Regulation:  Reviewed for med. necessity/level of care/duration of stay  If discussed at [place] of Stay Meetings, dates discussed:    Comments:  08/26/12- 1130- Marvetta Gibbons RN, BSN [phone] Referral for [facility]- RN- pt for d/c today- in to speak with pt - per conversation pt states that she has had orders for HH-RN in the past and has wanted to use [facility]- but her insurance  copay for [facility] services is $150 and she states she can not afford that on her fixed income- she politely declines [place] services at this time due to cost. No referral made. RN aware pt ready for discharge and no HH services arranged

## 2012-08-26 NOTE — Consult Note (Addendum)
CARDIOLOGY CONSULT NOTE  Patient ID: Destiny [name] MRN: [id number] DOB/AGE: [birth year] 62 y.o.  Admit date: 08/23/2012 Referring Physician  Ripu Rai, MD Primary Physician:  Salena Saner., MD Reason for Consultation  Hypertensive urgency  HPI: Patient is a 62 year old African American female with history of uncontrolled diabetes, uncontrolled hypertension mostly due to noncompliance. And recently acquired her after her previous cardiologist had discontinued care do to noncompliance. I have been seeing her on a very frequent basis and she had done really well until recently again after her brother died in an accident, she has started to be noncompliant again. She is now admitted with worsening shortness of breath and dyspnea on exertion and hypertensive urgency. She also did not bring any of her medications with her and there was confusion regarding medication history.  Presently patient states that she is feeling a whole lot better and she's laying flat in bed and states that her dyspnea has resolved. She denies any chest pain or palpitations. She denies any neurological weaknesses, visual disturbances, nausea or vomiting.  Past Medical History  Diagnosis Date  . CHF (congestive heart failure)   . Hypertension   . Diverticulitis   . Hyperlipidemia   . Asthma   . Diabetes mellitus     insulin dependent  . Arthritis     knee     Past Surgical History  Procedure Date  . Abdominal hysterectomy      History reviewed. No pertinent family history.   Social History: History   Social History  . Marital Status: Married    Spouse Name: N/A    Number of Children: N/A  . Years of Education: N/A   Occupational History  . Not on file.   Social History Main Topics  . Smoking status: Former Smoker    Quit date: 05/10/2012  . Smokeless tobacco: Never Used  . Alcohol Use: No  . Drug Use: No  . Sexually Active:    Other Topics Concern  . Not on file   Social History  Narrative  . No narrative on file     Prescriptions prior to admission  Medication Sig Dispense Refill  . acetaminophen (TYLENOL) 500 MG tablet Take 500 mg by mouth every 6 (six) hours as needed. For pain/fever      . amLODipine-valsartan (EXFORGE) 10-320 MG per tablet Take 1 tablet by mouth daily.      Marland Kitchen aspirin 81 MG chewable tablet Chew 81 mg by mouth daily.      . carvedilol (COREG) 25 MG tablet Take 25 mg by mouth 2 (two) times daily with a meal.      . cloNIDine (CATAPRES - DOSED IN MG/24 HR) 0.2 mg/24hr patch Place 1 patch onto the skin once a week. On Sundays      . furosemide (LASIX) 40 MG tablet Take 40 mg by mouth 2 (two) times daily.      . hydrALAZINE (APRESOLINE) 100 MG tablet Take 100 mg by mouth 2 (two) times daily. MD instructions q8hrs but pt only takes BID      . insulin NPH-insulin regular (NOVOLIN 70/30) (70-30) 100 UNIT/ML injection Inject 15-20 Units into the skin 2 (two) times daily with a meal. Use 20 units in the morning Use 15 units in the evening      . metFORMIN (GLUCOPHAGE) 500 MG tablet Take 500 mg by mouth 2 (two) times daily with a meal.      . potassium chloride SA (K-DUR,KLOR-CON) 20 MEQ tablet Take 20  mEq by mouth daily.      . simvastatin (ZOCOR) 40 MG tablet Take 40 mg by mouth every evening.      Marland Kitchen spironolactone (ALDACTONE) 25 MG tablet Take 25 mg by mouth daily.      . chlorpheniramine-HYDROcodone (TUSSIONEX PENNKINETIC ER) 10-8 MG/5ML LQCR Take 5 mLs by mouth every 12 (twelve) hours. Take 5 mLs by mouth every 12 (twelve) hours.  140 mL  0  . hydrALAZINE (APRESOLINE) 100 MG tablet Take 100 mg by mouth every 8 (eight) hours.        Scheduled Meds:   . amLODipine  10 mg Oral Daily  . aspirin  81 mg Oral Daily  . benzonatate  100 mg Oral TID  . carvedilol  25 mg Oral BID WC  . chlorpheniramine-HYDROcodone  5 mL Oral Q12H  . cloNIDine  0.2 mg Transdermal Weekly  . enoxaparin (LOVENOX) injection  40 mg Subcutaneous Q24H  . furosemide  60 mg Oral BID   . hydrALAZINE  100 mg Oral Q8H  . insulin aspart  0-5 Units Subcutaneous QHS  . insulin aspart  0-9 Units Subcutaneous TID WC  . irbesartan  300 mg Oral Daily  . potassium chloride  20 mEq Oral BID  . simvastatin  40 mg Oral QPM  . sodium chloride  3 mL Intravenous Q12H  . sodium chloride  3 mL Intravenous Q12H   Continuous Infusions:  PRN Meds:.sodium chloride, acetaminophen, acetaminophen, HYDROcodone-acetaminophen, labetalol, morphine injection, ondansetron (ZOFRAN) IV, ondansetron, sodium chloride, zolpidem  ROS: General: no fevers/chills/night sweats Eyes: no blurry vision, diplopia, or amaurosis ENT: no sore throat or hearing loss Resp: no cough, wheezing, or hemoptysis, has mild dyspnea which is chronic but she thinks she is back to baseline. CV: no edema or palpitations GI: no abdominal pain, nausea, vomiting, diarrhea, or constipation GU: no dysuria, frequency, or hematuria Skin: no rash Neuro: no headache, numbness, tingling, or weakness of extremities Musculoskeletal: no joint pain or swelling Heme: no bleeding, DVT, or easy bruising Endo: no polydipsia or polyuria    Physical Exam: Blood pressure 133/61, pulse 87, temperature 98.5 F (36.9 C), temperature source Oral, resp. rate 20, height 5\' 5"  (1.651 m), weight 69.31 kg (152 lb 12.8 oz), SpO2 99.00%.   General appearance: alert, cooperative, appears stated age and no distress Neck: no adenopathy, no carotid bruit, no JVD, supple, symmetrical, trachea midline and thyroid not enlarged, symmetric, no tenderness/mass/nodules Lungs: clear to auscultation bilaterally Heart: S1, S2 normal, S4 present, no click and no rub Abdomen: soft, non-tender; bowel sounds normal; no masses,  no organomegaly Extremities: extremities normal, atraumatic, no cyanosis or edema Pulses: Carotid pulses are normal without any bruit. No prominent abdominal aortic pulsation. No bowel bruit. Femoral pulses are 2+, popliteal pulses are 1+  bilaterally and pedal pulses are very faint. Neurologic: Grossly normal  Labs:   Lab Results  Component Value Date   WBC 11.3* 08/24/2012   HGB 9.9* 08/24/2012   HCT 29.8* 08/24/2012   MCV 85.1 08/24/2012   PLT 447* 08/24/2012    Lab 08/25/12 0912  NA 137  K 4.3  CL 97  CO2 26  BUN 27*  CREATININE 1.39*  CALCIUM 9.3  PROT --  BILITOT --  ALKPHOS --  ALT --  AST --  GLUCOSE 280*   Lab Results  Component Value Date   CKTOTAL 73 08/23/2011   CKMB 3.4 08/23/2011   TROPONINI <0.30 08/24/2012    Lipid Panel     Component Value  Date/Time   CHOL 184 12/01/2010 0455   TRIG 86 12/01/2010 0455   HDL 88 12/01/2010 0455   CHOLHDL 2.1 12/01/2010 0455   VLDL 17 12/01/2010 0455   LDLCALC  Value: 79        Total Cholesterol/HDL:CHD Risk Coronary Heart Disease Risk Table                     Men   Women  1/2 Average Risk   3.4   3.3  Average Risk       5.0   4.4  2 X Average Risk   9.6   7.1  3 X Average Risk  23.4   11.0        Use the calculated Patient Ratio above and the CHD Risk Table to determine the patient's CHD Risk.        ATP III CLASSIFICATION (LDL):  <100     mg/dL   Optimal  100-129  mg/dL   Near or Above                    Optimal  130-159  mg/dL   Borderline  160-189  mg/dL   High  >190     mg/dL   Very High 12/01/2010 0455    EKG: Independently read and reported 08/24/2012: unchanged from previous tracings, sinus rhythm at a rate of 76 beats a minute, normal axis, left atrial abnormality, LVH with repolarization abnormality cannot exclude lateral wall ischemia..    Radiology: X-ray Chest Pa And Lateral   08/25/2012  *RADIOLOGY REPORT*  Clinical Data: Cough.  Pulmonary nodules.  CHEST - 2 VIEW  Comparison: Plain film chest 02/04/2012 and 09/14.  Findings: There is mild cardiomegaly.  Patchy bilateral airspace opacities persist but have improved.  New very small bilateral pleural effusions are noted.  Heart size is upper normal.  IMPRESSION: Improved patchy bilateral airspace opacities  likely due to decreased edema.  Recommend continued follow-up.   Original Report Authenticated By: Orlean Patten, M.D.    Echocardiogram: 08/25/2011: Normal her physical systolic function, moderate left hypertrophy, grade 2 diastolic dysfunction.   ASSESSMENT AND PLAN:  1. Hypertensive urgency with patient presenting with acute on chronic diastolic heart failure and uncontrolled hypertension 2. Noncompliance with medical advice 3. Diabetes mellitus with renal manifestation, mild renal insufficiency with estimated GFR of 50 mL. 4. Diabetes mellitus type 2 uncontrolled hemoglobin A1c 9.3%. 5. Hyperlipidemia  Recommendation: Patient's blood pressure is now stabilized with the medication that had started her on. I will set her up appointment to see me next week to improve the compliance and I will continue to see her on a frequent basis to avoid hospitalization. I have outlined discussed with the patient that she bring all her medications with her every time she is a visit either at the hospital or the physician's office. Patient does admit to being noncompliant with salt and fluid restriction and does not give me a good reason. I be treated this again. Her pressure is stable today and she can be discharged from my standpoint. I will set up an appointment to see me next week. Laverda Page, MD 08/26/2012, 8:45 AM [facility] [place] Pager: [phone] Office: [phone] If no answer Cell [phone]

## 2013-02-15 ENCOUNTER — Other Ambulatory Visit: Payer: Self-pay

## 2013-02-15 DIAGNOSIS — Z1231 Encounter for screening mammogram for malignant neoplasm of breast: Secondary | ICD-10-CM

## 2013-02-28 ENCOUNTER — Ambulatory Visit
Admission: RE | Admit: 2013-02-28 | Discharge: 2013-02-28 | Disposition: A | Discharge status: Home / Self Care | Payer: BC Managed Care – PPO | Source: Ambulatory Visit

## 2013-02-28 DIAGNOSIS — Z1231 Encounter for screening mammogram for malignant neoplasm of breast: Secondary | ICD-10-CM

## 2013-03-31 ENCOUNTER — Inpatient Hospital Stay (HOSPITAL_COMMUNITY)
Admission: EM | Admit: 2013-03-31 | Discharge: 2013-04-01 | DRG: 174 | Disposition: A | Discharge status: Home / Self Care | Payer: BC Managed Care – PPO | Attending: Internal Medicine | Admitting: Internal Medicine

## 2013-03-31 ENCOUNTER — Encounter (HOSPITAL_COMMUNITY): Payer: Self-pay | Admitting: Emergency Medicine

## 2013-03-31 ENCOUNTER — Emergency Department (HOSPITAL_COMMUNITY): Payer: BC Managed Care – PPO

## 2013-03-31 ENCOUNTER — Encounter (HOSPITAL_COMMUNITY)
Admission: EM | Disposition: A | Discharge status: Home / Self Care | Payer: Self-pay | Source: Home / Self Care | Attending: Internal Medicine

## 2013-03-31 DIAGNOSIS — J309 Allergic rhinitis, unspecified: Secondary | ICD-10-CM

## 2013-03-31 DIAGNOSIS — W19XXXA Unspecified fall, initial encounter: Secondary | ICD-10-CM | POA: Diagnosis present

## 2013-03-31 DIAGNOSIS — Z888 Allergy status to other drugs, medicaments and biological substances status: Secondary | ICD-10-CM

## 2013-03-31 DIAGNOSIS — K319 Disease of stomach and duodenum, unspecified: Secondary | ICD-10-CM | POA: Diagnosis present

## 2013-03-31 DIAGNOSIS — Z794 Long term (current) use of insulin: Secondary | ICD-10-CM

## 2013-03-31 DIAGNOSIS — Z87891 Personal history of nicotine dependence: Secondary | ICD-10-CM

## 2013-03-31 DIAGNOSIS — I509 Heart failure, unspecified: Secondary | ICD-10-CM | POA: Diagnosis present

## 2013-03-31 DIAGNOSIS — I5033 Acute on chronic diastolic (congestive) heart failure: Secondary | ICD-10-CM

## 2013-03-31 DIAGNOSIS — Y92009 Unspecified place in unspecified non-institutional (private) residence as the place of occurrence of the external cause: Secondary | ICD-10-CM

## 2013-03-31 DIAGNOSIS — Z7982 Long term (current) use of aspirin: Secondary | ICD-10-CM

## 2013-03-31 DIAGNOSIS — J45909 Unspecified asthma, uncomplicated: Secondary | ICD-10-CM

## 2013-03-31 DIAGNOSIS — I10 Essential (primary) hypertension: Secondary | ICD-10-CM

## 2013-03-31 DIAGNOSIS — K922 Gastrointestinal hemorrhage, unspecified: Secondary | ICD-10-CM

## 2013-03-31 DIAGNOSIS — E119 Type 2 diabetes mellitus without complications: Secondary | ICD-10-CM | POA: Diagnosis present

## 2013-03-31 DIAGNOSIS — E876 Hypokalemia: Secondary | ICD-10-CM | POA: Diagnosis present

## 2013-03-31 DIAGNOSIS — R55 Syncope and collapse: Secondary | ICD-10-CM

## 2013-03-31 DIAGNOSIS — K920 Hematemesis: Principal | ICD-10-CM

## 2013-03-31 DIAGNOSIS — E785 Hyperlipidemia, unspecified: Secondary | ICD-10-CM

## 2013-03-31 HISTORY — PX: ESOPHAGOGASTRODUODENOSCOPY: SHX5428

## 2013-03-31 HISTORY — DX: Gastrointestinal hemorrhage, unspecified: K92.2

## 2013-03-31 LAB — APTT: aPTT: 26 seconds (ref 24–37)

## 2013-03-31 LAB — CBC WITH DIFFERENTIAL/PLATELET
Basophils Absolute: 0 10*3/uL (ref 0.0–0.1)
Basophils Relative: 0 % (ref 0–1)
Eosinophils Absolute: 0.3 10*3/uL (ref 0.0–0.7)
Eosinophils Relative: 3 % (ref 0–5)
HCT: 33.2 % — ABNORMAL LOW (ref 36.0–46.0)
Hemoglobin: 11.8 g/dL — ABNORMAL LOW (ref 12.0–15.0)
Lymphocytes Relative: 45 % (ref 12–46)
Lymphs Abs: 4.2 10*3/uL — ABNORMAL HIGH (ref 0.7–4.0)
MCH: 29.8 pg (ref 26.0–34.0)
MCHC: 35.5 g/dL (ref 30.0–36.0)
MCV: 83.8 fL (ref 78.0–100.0)
Monocytes Absolute: 0.5 10*3/uL (ref 0.1–1.0)
Monocytes Relative: 5 % (ref 3–12)
Neutro Abs: 4.4 10*3/uL (ref 1.7–7.7)
Neutrophils Relative %: 47 % (ref 43–77)
Platelets: 402 10*3/uL — ABNORMAL HIGH (ref 150–400)
RBC: 3.96 MIL/uL (ref 3.87–5.11)
RDW: 12.1 % (ref 11.5–15.5)
WBC: 9.3 10*3/uL (ref 4.0–10.5)

## 2013-03-31 LAB — CBC
HCT: 31.5 % — ABNORMAL LOW (ref 36.0–46.0)
Hemoglobin: 11.1 g/dL — ABNORMAL LOW (ref 12.0–15.0)
MCH: 29.7 pg (ref 26.0–34.0)
MCHC: 35.2 g/dL (ref 30.0–36.0)
MCV: 84.2 fL (ref 78.0–100.0)
Platelets: 394 10*3/uL (ref 150–400)
RBC: 3.74 MIL/uL — ABNORMAL LOW (ref 3.87–5.11)
RDW: 12.3 % (ref 11.5–15.5)
WBC: 9.1 10*3/uL (ref 4.0–10.5)

## 2013-03-31 LAB — POCT I-STAT TROPONIN I: Troponin i, poc: 0.06 ng/mL (ref 0.00–0.08)

## 2013-03-31 LAB — BASIC METABOLIC PANEL
BUN: 28 mg/dL — ABNORMAL HIGH (ref 6–23)
CO2: 29 mEq/L (ref 19–32)
Calcium: 9.6 mg/dL (ref 8.4–10.5)
Chloride: 94 mEq/L — ABNORMAL LOW (ref 96–112)
Creatinine, Ser: 1.55 mg/dL — ABNORMAL HIGH (ref 0.50–1.10)
GFR calc Af Amer: 40 mL/min — ABNORMAL LOW (ref >90)
GFR calc non Af Amer: 35 mL/min — ABNORMAL LOW (ref >90)
Glucose, Bld: 127 mg/dL — ABNORMAL HIGH (ref 70–99)
Potassium: 2.4 mEq/L — CL (ref 3.5–5.1)
Sodium: 136 mEq/L (ref 135–145)

## 2013-03-31 LAB — ABO/RH: ABO/RH(D): B POS

## 2013-03-31 LAB — TYPE AND SCREEN
ABO/RH(D): B POS
Antibody Screen: NEGATIVE

## 2013-03-31 LAB — GLUCOSE, CAPILLARY
Glucose-Capillary: 170 mg/dL — ABNORMAL HIGH (ref 70–99)
Glucose-Capillary: 195 mg/dL — ABNORMAL HIGH (ref 70–99)
Glucose-Capillary: 178 mg/dL — ABNORMAL HIGH (ref 70–99)
Glucose-Capillary: 315 mg/dL — ABNORMAL HIGH (ref 70–99)

## 2013-03-31 LAB — HEPATIC FUNCTION PANEL
ALT: 17 U/L (ref 0–35)
AST: 20 U/L (ref 0–37)
Albumin: 3 g/dL — ABNORMAL LOW (ref 3.5–5.2)
Alkaline Phosphatase: 142 U/L — ABNORMAL HIGH (ref 39–117)
Bilirubin, Direct: <0.1 mg/dL (ref 0.0–0.3)
Total Bilirubin: 0.1 mg/dL — ABNORMAL LOW (ref 0.3–1.2)
Total Protein: 7.4 g/dL (ref 6.0–8.3)

## 2013-03-31 LAB — PROTIME-INR
INR: 0.82 (ref 0.00–1.49)
Prothrombin Time: 11.2 seconds — ABNORMAL LOW (ref 11.6–15.2)

## 2013-03-31 LAB — OCCULT BLOOD GASTRIC / DUODENUM (SPECIMEN CUP): Occult Blood, Gastric: POSITIVE — AB

## 2013-03-31 SURGERY — EGD (ESOPHAGOGASTRODUODENOSCOPY)
Anesthesia: Moderate Sedation

## 2013-03-31 MED ORDER — FENTANYL CITRATE 0.05 MG/ML IJ SOLN
INTRAMUSCULAR | Status: AC
  Filled 2013-03-31: qty 2

## 2013-03-31 MED ORDER — FENTANYL CITRATE 0.05 MG/ML IJ SOLN
INTRAMUSCULAR | Status: DC | PRN
  Administered 2013-03-31 (×2): 25 ug via INTRAVENOUS

## 2013-03-31 MED ORDER — HYDRALAZINE HCL 20 MG/ML IJ SOLN: 10.0000 mg | Freq: Four times a day (QID) | INTRAMUSCULAR | Status: DC | PRN

## 2013-03-31 MED ORDER — ATORVASTATIN CALCIUM 20 MG PO TABS
20.0000 mg | ORAL_TABLET | Freq: Every day | ORAL | Status: DC
  Administered 2013-03-31: 20 mg via ORAL
  Filled 2013-03-31 (×2): qty 1

## 2013-03-31 MED ORDER — METOCLOPRAMIDE HCL 5 MG/ML IJ SOLN
10.0000 mg | Freq: Once | INTRAMUSCULAR | Status: AC
  Administered 2013-03-31: 10 mg via INTRAVENOUS
  Filled 2013-03-31: qty 2

## 2013-03-31 MED ORDER — CLONIDINE HCL 0.2 MG/24HR TD PTWK: 0.2000 mg | MEDICATED_PATCH | TRANSDERMAL | Status: DC

## 2013-03-31 MED ORDER — CARVEDILOL 25 MG PO TABS
25.0000 mg | ORAL_TABLET | Freq: Two times a day (BID) | ORAL | Status: DC
  Administered 2013-03-31 – 2013-04-01 (×3): 25 mg via ORAL
  Filled 2013-03-31 (×5): qty 1

## 2013-03-31 MED ORDER — SODIUM CHLORIDE 0.9 % IV SOLN
8.0000 mg/h | INTRAVENOUS | Status: DC
  Administered 2013-03-31: 8 mg/h via INTRAVENOUS
  Filled 2013-03-31 (×3): qty 80

## 2013-03-31 MED ORDER — AMLODIPINE BESYLATE-VALSARTAN 10-320 MG PO TABS: 1.0000 | ORAL_TABLET | Freq: Every day | ORAL | Status: DC

## 2013-03-31 MED ORDER — MIDAZOLAM HCL 10 MG/2ML IJ SOLN
INTRAMUSCULAR | Status: DC | PRN
  Administered 2013-03-31 (×2): 2 mg via INTRAVENOUS

## 2013-03-31 MED ORDER — BUTAMBEN-TETRACAINE-BENZOCAINE 2-2-14 % EX AERO
INHALATION_SPRAY | CUTANEOUS | Status: DC | PRN
  Administered 2013-03-31: 2 via TOPICAL

## 2013-03-31 MED ORDER — IRBESARTAN 300 MG PO TABS
300.0000 mg | ORAL_TABLET | Freq: Every day | ORAL | Status: DC
  Administered 2013-03-31 – 2013-04-01 (×2): 300 mg via ORAL
  Filled 2013-03-31 (×2): qty 1

## 2013-03-31 MED ORDER — ONDANSETRON HCL 4 MG/2ML IJ SOLN
4.0000 mg | Freq: Once | INTRAMUSCULAR | Status: AC
  Administered 2013-03-31: 4 mg via INTRAVENOUS
  Filled 2013-03-31: qty 2

## 2013-03-31 MED ORDER — MIDAZOLAM HCL 5 MG/ML IJ SOLN
INTRAMUSCULAR | Status: AC
  Filled 2013-03-31: qty 2

## 2013-03-31 MED ORDER — PANTOPRAZOLE SODIUM 40 MG IV SOLR: 40.0000 mg | Freq: Two times a day (BID) | INTRAVENOUS | Status: DC

## 2013-03-31 MED ORDER — SODIUM CHLORIDE 0.9 % IV SOLN: INTRAVENOUS | Status: DC

## 2013-03-31 MED ORDER — POTASSIUM CHLORIDE 10 MEQ/100ML IV SOLN
10.0000 meq | INTRAVENOUS | Status: AC
  Administered 2013-03-31 (×4): 10 meq via INTRAVENOUS
  Filled 2013-03-31 (×4): qty 100

## 2013-03-31 MED ORDER — DIPHENHYDRAMINE HCL 50 MG/ML IJ SOLN
INTRAMUSCULAR | Status: AC
  Filled 2013-03-31: qty 1

## 2013-03-31 MED ORDER — HYDRALAZINE HCL 50 MG PO TABS
100.0000 mg | ORAL_TABLET | Freq: Three times a day (TID) | ORAL | Status: DC
  Administered 2013-03-31 – 2013-04-01 (×4): 100 mg via ORAL
  Filled 2013-03-31 (×7): qty 2

## 2013-03-31 MED ORDER — POTASSIUM CHLORIDE 10 MEQ/100ML IV SOLN
10.0000 meq | Freq: Once | INTRAVENOUS | Status: DC
  Filled 2013-03-31: qty 100

## 2013-03-31 MED ORDER — PANTOPRAZOLE SODIUM 40 MG IV SOLR
80.0000 mg | Freq: Once | INTRAVENOUS | Status: AC
  Administered 2013-03-31: 80 mg via INTRAVENOUS
  Filled 2013-03-31: qty 80

## 2013-03-31 MED ORDER — POTASSIUM CHLORIDE 10 MEQ/100ML IV SOLN: 10.0000 meq | Freq: Once | INTRAVENOUS | Status: DC

## 2013-03-31 MED ORDER — SODIUM CHLORIDE 0.9 % IV BOLUS (SEPSIS): 250.0000 mL | INTRAVENOUS | Status: DC | PRN

## 2013-03-31 MED ORDER — SIMVASTATIN 40 MG PO TABS
40.0000 mg | ORAL_TABLET | Freq: Every evening | ORAL | Status: DC
  Filled 2013-03-31: qty 1

## 2013-03-31 MED ORDER — AMLODIPINE BESYLATE 10 MG PO TABS
10.0000 mg | ORAL_TABLET | Freq: Every day | ORAL | Status: DC
  Administered 2013-03-31 – 2013-04-01 (×2): 10 mg via ORAL
  Filled 2013-03-31 (×2): qty 1

## 2013-03-31 MED ORDER — PANTOPRAZOLE SODIUM 40 MG IV SOLR
40.0000 mg | Freq: Once | INTRAVENOUS | Status: AC
  Administered 2013-03-31: 40 mg via INTRAVENOUS
  Filled 2013-03-31: qty 40

## 2013-03-31 MED ORDER — SODIUM CHLORIDE 0.9 % IJ SOLN
3.0000 mL | Freq: Two times a day (BID) | INTRAMUSCULAR | Status: DC
  Administered 2013-03-31 (×2): 3 mL via INTRAVENOUS

## 2013-03-31 MED ORDER — INSULIN ASPART 100 UNIT/ML ~~LOC~~ SOLN
0.0000 [IU] | SUBCUTANEOUS | Status: DC
  Administered 2013-03-31 – 2013-04-01 (×3): 2 [IU] via SUBCUTANEOUS
  Administered 2013-04-01: 5 [IU] via SUBCUTANEOUS

## 2013-03-31 MED ORDER — POTASSIUM CHLORIDE CRYS ER 20 MEQ PO TBCR
20.0000 meq | EXTENDED_RELEASE_TABLET | Freq: Every day | ORAL | Status: DC
  Administered 2013-03-31: 20 meq via ORAL
  Filled 2013-03-31 (×2): qty 1

## 2013-03-31 NOTE — Progress Notes (Signed)
Pt rec from endo. Pt no complaints of pain n/v or sob. Meal order for Pt.  freq vs set to 4x43min. Will continue to monitor Pt

## 2013-03-31 NOTE — ED Notes (Addendum)
Pt's husband reports, pt fell and hit her head. Per pt's husband, pt's eyes rolled to the back of her head and she lost consciousness for a few minutes. Per pt's husband, pt has been vomiting ever since. Pt presented to the ED vomiting. 4mg  of zofran administered to the pt en route by EMS. Pt is A&O.

## 2013-03-31 NOTE — ED Notes (Signed)
Attempted to give report 

## 2013-03-31 NOTE — ED Notes (Signed)
Pt's husband reports pt has a hx of very high BP readings, pt's husband reports that pt takes her BP medication regularly.

## 2013-03-31 NOTE — ED Notes (Signed)
Iv team called, two RNs attempted to start an IV on pt.

## 2013-03-31 NOTE — Progress Notes (Signed)
[facility] PROGRESS NOTE Assessment/Plan: *UGI bleed: - protonix drip. Hbg stable. No further vomiting. - NPO, GI consulted. No use of NSAID's or steroids.   HTN (hypertension), malignant: - resume home meds. - hydralazine PRN.  DIABETES MELLITUS, TYPE II - NPO, SSI.  Hypokalemia: - replete IV. - b-met in am.  Code Status: full Family Communication: husband  Disposition Plan: inpatient   Consultants:  GI  Procedures:  EGD  Antibiotics:  none   HPI/Subjective: Complaing of head aches  Objective: Filed Vitals:   03/31/13 0630 03/31/13 0742 03/31/13 0747 03/31/13 0800  BP: 145/73  227/96 211/91  Pulse: 63  71 71  Temp:      TempSrc:      Resp: 16  18   Height:   5\' 4"  (1.626 m)   Weight:  63.821 kg (140 lb 11.2 oz) 63.821 kg (140 lb 11.2 oz)   SpO2: 100%  100%     Intake/Output Summary (Last 24 hours) at 03/31/13 0817 Last data filed at 03/31/13 [id number]  Gross per 24 hour  Intake    400 ml  Output      0 ml  Net    400 ml   Filed Weights   03/31/13 0742 03/31/13 0747  Weight: 63.821 kg (140 lb 11.2 oz) 63.821 kg (140 lb 11.2 oz)    Exam:  General: Alert, awake, oriented x3, in no acute distress.  HEENT: No bruits, no goiter.  Heart: Regular rate and rhythm, without murmurs, rubs, gallops.  Lungs: Good air movement, clear to auscultation. Abdomen: Soft, nontender, nondistended, positive bowel sounds.  Neuro: Grossly intact, nonfocal.   Data Reviewed: Basic Metabolic Panel:  Recent Labs Lab 03/31/13 0225  NA 136  K 2.4*  CL 94*  CO2 29  GLUCOSE 127*  BUN 28*  CREATININE 1.55*  CALCIUM 9.6   Liver Function Tests:  Recent Labs Lab 03/31/13 0225  AST 20  ALT 17  ALKPHOS 142*  BILITOT 0.1*  PROT 7.4  ALBUMIN 3.0*   No results found for this basename: LIPASE, AMYLASE,  in the last 168 hours No results found for this basename: AMMONIA,  in the last 168 hours CBC:  Recent Labs Lab 03/31/13 0225 03/31/13 0517  WBC 9.3  9.1  NEUTROABS 4.4  --   HGB 11.8* 11.1*  HCT 33.2* 31.5*  MCV 83.8 84.2  PLT 402* 394   Cardiac Enzymes: No results found for this basename: CKTOTAL, CKMB, CKMBINDEX, TROPONINI,  in the last 168 hours BNP (last 3 results)  Recent Labs  08/23/12 2335  PROBNP 7978.0*   CBG: No results found for this basename: GLUCAP,  in the last 168 hours  No results found for this or any previous visit (from the past 240 hour(s)).   Studies: Ct Head Wo Contrast  03/31/2013   *RADIOLOGY REPORT*  Clinical Data: Dizziness, vomiting  CT HEAD WITHOUT CONTRAST  Technique:  Contiguous axial images were obtained from the base of the skull through the vertex without contrast.  Comparison: None.  Findings: There is no acute intracranial hemorrhage or infarct.  No mass lesion or midline shift.  Subtle calcification is noted within the basal ganglia bilaterally.  Gray-white matter differentiation is maintained.  No midline shift or mass lesion.  No extra-axial fluid collection.  Calvarium is intact.  Orbital soft tissues are normal.  There is scattered mucosal thickening within the ethmoidal air cells bilaterally.  Paranasal sinuses are otherwise clear.  Mastoid air cells well pneumatized.  IMPRESSION:  No acute intracranial process.   Original Report Authenticated By: Jeannine Boga, M.D.   Dg Chest Portable 1 View  03/31/2013   *RADIOLOGY REPORT*  Clinical Data: Vomiting.  PORTABLE CHEST - 1 VIEW  Comparison: 08/25/2012.  Findings: Mild cardiomegaly is unchanged from prior, as are upper mediastinal contours.  Aortic arch atherosclerosis.  No infiltrate, edema, effusion, pneumothorax.  Glenohumeral and acromioclavicular osteoarthritis, right more than left.  IMPRESSION: No evidence of acute cardiopulmonary disease.   Original Report Authenticated By: Jorje Guild    Scheduled Meds: . amLODipine  10 mg Oral Daily   And  . irbesartan  300 mg Oral Daily  . carvedilol  25 mg Oral BID WC  . [START ON  04/06/2013] cloNIDine  0.2 mg Transdermal Q Thu  . hydrALAZINE  100 mg Oral Q8H  . insulin aspart  0-9 Units Subcutaneous Q4H  . [START ON 04/03/2013] pantoprazole (PROTONIX) IV  40 mg Intravenous Q12H  . potassium chloride  10 mEq Intravenous Once  . potassium chloride  10 mEq Intravenous Q1 Hr x 4  . potassium chloride SA  20 mEq Oral Daily  . simvastatin  40 mg Oral QPM  . sodium chloride  3 mL Intravenous Q12H   Continuous Infusions: . pantoprozole (PROTONIX) infusion 8 mg/hr (03/31/13 0615)     Charlynne Cousins  [facility] Pager [phone]. If 8PM-8AM, please contact night-coverage at [email or web address], password [facility] 03/31/2013, 8:17 AM  LOS: 0 days

## 2013-03-31 NOTE — Consult Note (Signed)
[facility] Consult: 8:40 AM 03/31/2013   Referring Provider:  Dr Alcario Drought Primary Care Physician:  Salena Saner., MD Primary Gastroenterologist:  Althia Forts, none  Reason for Consultation:  Hematemesis  HPI: Destiny Day is a 62 y.o. female.  Hx IDDM, diastolic heart failure.  Remote hx of diverticulitis hx but pt has never had colonoscopy.    After taking HS insulin, went to bed feeling fine.  Awakened with nausea 1 AM and proceeded to vomit brown, maroon emesis several times.  No abd pain.  No recent GI problems, does not have heart burn.  Nausea, vomiting resolved after meds in ED.  She had dizziness at home and fell backwards.  Her head hit the dresser.  CT of head is benign.  Hgb is stable went from 11.8 to 11.1 within several hours.  On BID IV Protonix.  BUN and creatinine are slightly increased over baseline.  Takes 81 mg ASA daily but no thinners, no NSAIDs.  Only dysphagia is with swallowing Potassium pill.   Last BM was in AM yesterday, it was brown.   Past Medical History  Diagnosis Date  . CHF (congestive heart failure)   . Hypertension   . Diverticulitis   . Hyperlipidemia   . Asthma   . Diabetes mellitus     insulin dependent  . Arthritis     knee    Past Surgical History  Procedure Laterality Date  . Abdominal hysterectomy      Prior to Admission medications   Medication Sig Start Date End Date Taking? Authorizing Provider  acetaminophen (TYLENOL) 500 MG tablet Take 500 mg by mouth every 6 (six) hours as needed. For pain/fever    Historical Provider, MD  amLODipine-valsartan (EXFORGE) 10-320 MG per tablet Take 1 tablet by mouth daily. 08/26/12   Ripudeep Krystal Eaton, MD  aspirin 81 MG chewable tablet Chew 81 mg by mouth daily.    Historical Provider, MD  benzonatate (TESSALON) 100 MG capsule Take 1 capsule (100 mg total) by mouth 3 (three) times daily. X 10 days 08/26/12   Ripudeep Krystal Eaton, MD  carvedilol (COREG) 25  MG tablet Take 1 tablet (25 mg total) by mouth 2 (two) times daily with a meal. 08/26/12   Ripudeep Krystal Eaton, MD  chlorpheniramine-HYDROcodone (TUSSIONEX PENNKINETIC ER) 10-8 MG/5ML LQCR Take 5 mLs by mouth every 12 (twelve) hours. Take 5 mLs by mouth every 12 (twelve) hours. 10/07/11   Ruthell Rummage Dammen, PA-C  cloNIDine (CATAPRES - DOSED IN MG/24 HR) 0.2 mg/24hr patch Place 1 patch (0.2 mg total) onto the skin once a week. On Thursdays 08/26/12   Ripudeep Krystal Eaton, MD  furosemide (LASIX) 40 MG tablet Take 1 tablet (40 mg total) by mouth 2 (two) times daily. 08/26/12   Ripudeep Krystal Eaton, MD  hydrALAZINE (APRESOLINE) 100 MG tablet Take 100 mg by mouth every 8 (eight) hours. 08/23/11 08/22/12  Wenda Low, MD  hydrALAZINE (APRESOLINE) 100 MG tablet Take 1 tablet (100 mg total) by mouth 3 (three) times daily. 08/26/12 08/26/13  Ripudeep Krystal Eaton, MD  insulin NPH-insulin regular (NOVOLIN 70/30) (70-30) 100 UNIT/ML injection Inject 8 Units into the skin 2 (two) times daily with a meal. 08/26/12   Ripudeep K Rai, MD  potassium chloride SA (K-DUR,KLOR-CON) 20 MEQ tablet Take 20 mEq by mouth daily.    Historical Provider, MD  simvastatin (ZOCOR) 40 MG tablet Take 40 mg by mouth every evening.    Historical Provider, MD    Scheduled Meds: . amLODipine  10 mg Oral Daily   And  . irbesartan  300 mg Oral Daily  . carvedilol  25 mg Oral BID WC  . [START ON 04/06/2013] cloNIDine  0.2 mg Transdermal Q Thu  . hydrALAZINE  100 mg Oral Q8H  . insulin aspart  0-9 Units Subcutaneous Q4H  . [START ON 04/03/2013] pantoprazole (PROTONIX) IV  40 mg Intravenous Q12H  . potassium chloride  10 mEq Intravenous Once  . potassium chloride SA  20 mEq Oral Daily  . simvastatin  40 mg Oral QPM  . sodium chloride  3 mL Intravenous Q12H   Infusions: . pantoprozole (PROTONIX) infusion 8 mg/hr (03/31/13 0615)   PRN Meds: hydrALAZINE   Allergies as of 03/31/2013 - Review Complete 03/31/2013  Allergen Reaction Noted  . Lisinopril Cough  08/22/2011    No family history on file.  History   Social History  . Marital Status: Married    Spouse Name: N/A    Number of Children: N/A  . Years of Education: N/A   Occupational History  . Not on file.   Social History Main Topics  . Smoking status: Former Smoker    Quit date: 05/10/2012  . Smokeless tobacco: Never Used  . Alcohol Use: No  . Drug Use: No  . Sexual Activity:    Other Topics Concern  . Not on file   Social History Narrative  . No narrative on file    REVIEW OF SYSTEMS: Constitutional:  Stable weight.  Still works part time as International aid/development worker, if she had gone to work today, she would have worked 20 hours this week ENT:  No nose bleeds, no dental problems Pulm:  No dyspnea or cough CV:  No palpitations GU:  No blood in urine, + occasional frequency, no incontinence GI:  Per HPI Heme:  No hx anemia.    Transfusions:  none Neuro:  No headaches MS:  No pain in body Derm:  No rash, itching or sores Endocrine:  No sweats no chills.  Sugars run 70s to high 100s.  Immunization:  Not queried Travel:  None.    PHYSICAL EXAM: Vital signs in last 24 hours: Temp:  [97.5 F (36.4 C)] 97.5 F (36.4 C) (09/05 0221) Pulse Rate:  [62-78] 71 (09/05 0800) Resp:  [13-19] 18 (09/05 0747) BP: (129-227)/(63-103) 211/91 mmHg (09/05 0800) SpO2:  [98 %-100 %] 100 % (09/05 0747) Weight:  [63.821 kg (140 lb 11.2 oz)] 63.821 kg (140 lb 11.2 oz) (09/05 0747)  General: pleasant, tired but well appearing AAF, NAD Head:  No signs of trauma  Eyes:  No icterus, no pallor Ears:  Not HOH  Nose:  No discharge Mouth:  Rusty brown staining on tongue.  Moist oral MM Neck:  No mass, no TMG, no bruits, no JVD Lungs:  Clear bil.  No cough or dyspnea Heart: RRR.  No MRG Abdomen:  Soft, NT, ND.  No mass or HSM.   Rectal: deferred   Musc/Skeltl: no joint swelling, no signs of trauma Extremities:  No CCE  Neurologic:  No confusion, no limb weakness, no tremor   Oriented x 3.  Skin:  No rash, no lacerations Tattoos:  none Nodes:  No cervical adenopathy   Psych:  Pleasant, cooperative, flat affect.   Intake/Output from previous day: 09/04 0701 - 09/05 0700 In: 500 [I.V.:400; IV Piggyback:100] Out: -  Intake/Output this shift: Total I/O In: 157.1 [I.V.:57.1; IV Piggyback:100] Out: -   LAB RESULTS:  Recent Labs  03/31/13  0225 03/31/13 0517  WBC 9.3 9.1  HGB 11.8* 11.1*  HCT 33.2* 31.5*  PLT 402* 394   BMET Lab Results  Component Value Date   NA 136 03/31/2013   NA 137 08/25/2012   NA 136 08/24/2012   K 2.4* 03/31/2013   K 4.3 08/25/2012   K 3.2* 08/24/2012   CL 94* 03/31/2013   CL 97 08/25/2012   CL 100 08/24/2012   CO2 29 03/31/2013   CO2 26 08/25/2012   CO2 24 08/24/2012   GLUCOSE 127* 03/31/2013   GLUCOSE 280* 08/25/2012   GLUCOSE 241* 08/24/2012   BUN 28* 03/31/2013   BUN 27* 08/25/2012   BUN 24* 08/24/2012   CREATININE 1.55* 03/31/2013   CREATININE 1.39* 08/25/2012   CREATININE 1.31* 08/24/2012   CALCIUM 9.6 03/31/2013   CALCIUM 9.3 08/25/2012   CALCIUM 8.6 08/24/2012   LFT  Recent Labs  03/31/13 0225  PROT 7.4  ALBUMIN 3.0*  AST 20  ALT 17  ALKPHOS 142*  BILITOT 0.1*  BILIDIR <0.1  IBILI NOT CALCULATED   PT/INR Lab Results  Component Value Date   INR 0.82 03/31/2013   INR 0.99 12/01/2010    RADIOLOGY STUDIES: Ct Head Wo Contrast  03/31/2013   *RADIOLOGY REPORT*  Clinical Data: Dizziness, vomiting  CT HEAD WITHOUT CONTRAST  Technique:  Contiguous axial images were obtained from the base of the skull through the vertex without contrast.  Comparison: None.  Findings: There is no acute intracranial hemorrhage or infarct.  No mass lesion or midline shift.  Subtle calcification is noted within the basal ganglia bilaterally.  Gray-white matter differentiation is maintained.  No midline shift or mass lesion.  No extra-axial fluid collection.  Calvarium is intact.  Orbital soft tissues are normal.  There is scattered mucosal thickening  within the ethmoidal air cells bilaterally.  Paranasal sinuses are otherwise clear.  Mastoid air cells well pneumatized.  IMPRESSION: No acute intracranial process.   Original Report Authenticated By: Jeannine Boga, M.D.   Dg Chest Portable 1 View  03/31/2013   *RADIOLOGY REPORT*  Clinical Data: Vomiting.  PORTABLE CHEST - 1 VIEW  Comparison: 08/25/2012.  Findings: Mild cardiomegaly is unchanged from prior, as are upper mediastinal contours.  Aortic arch atherosclerosis.  No infiltrate, edema, effusion, pneumothorax.  Glenohumeral and acromioclavicular osteoarthritis, right more than left.  IMPRESSION: No evidence of acute cardiopulmonary disease.   Original Report Authenticated By: Jorje Guild    ENDOSCOPIC STUDIES: None ever  IMPRESSION: *  UGI bleed *  Syncope with head hitting dresser during fall, CT head non-traumatic.  *  Stable Hgb *  IDDM  PLAN: *  EGD this afternoon.   *  OK to have Ice chips, sips until 1 PM today.    LOS: 0 days   Azucena Freed  03/31/2013, 8:40 AM Pager: [phone]     [facility] Attending  I have also seen and assessed the patient and agree with the above note. EGD to evaluate hematemesis The risks and benefits as well as alternatives of endoscopic procedure(s) have been discussed and reviewed. All questions answered. The patient agrees to proceed.  Gatha Mayer, MD, [facility] [phone] (pager) 03/31/2013 4:49 PM

## 2013-03-31 NOTE — ED Notes (Signed)
Potassium chloride stopped. Inquired MD if he wanted 2nd run of potassium chloride, MD said not at this time. Wait at least one hour.

## 2013-03-31 NOTE — Op Note (Signed)
[facility] [place], [id number]   ENDOSCOPY PROCEDURE REPORT  PATIENT: Destiny, Day  MR#: [id number] BIRTHDATE: 1951/07/06 , 62  yrs. old GENDER: Female ENDOSCOPIST: Gatha Mayer, MD, [facility] PROCEDURE DATE:  03/31/2013 PROCEDURE:  EGD w/ biopsy ASA CLASS:     Class II INDICATIONS:  Hematemesis. MEDICATIONS: Fentanyl 50 mcg IV and Versed 4 mg IV TOPICAL ANESTHETIC: Cetacaine Spray  DESCRIPTION OF PROCEDURE: After the risks benefits and alternatives of the procedure were thoroughly explained, informed consent was obtained.  The PENTAX GASTOROSCOPE [id number] endoscope was introduced through the mouth and advanced to the second portion of the duodenum. Without limitations.  The instrument was slowly withdrawn as the mucosa was fully examined.        STOMACH: Mild gastropathy was found in the gastric fundus. Patchy irreghular and erythematous mucosa Multiple biopsies were performed using cold forceps.  Sample sent for histology.  The remainder of the upper endoscopy exam was otherwise normal. Retroflexed views revealed as above.     The scope was then withdrawn from the patient and the procedure completed.  COMPLICATIONS: There were no complications. ENDOSCOPIC IMPRESSION: 1.   Gastropathy was found in the gastric fundus; multiple biopsies - this could have been a result of vomiting and a source of bleeding but not a source for major bleeding, it seems 2.   The remainder of the upper endoscopy exam was otherwise normal  RECOMMENDATIONS: Supportive care - not clear she will need PPI beyond hospitalization I will f/u tomorrow - have orderd iron studies and B12    eSigned:  Gatha Mayer, MD, [facility] 03/31/2013 5:56 PM CC:Kim Karlton Lemon, MD and The Patient

## 2013-03-31 NOTE — ED Notes (Signed)
Admitting MD at bedside.

## 2013-03-31 NOTE — ED Notes (Signed)
Per EMS: Pt from home, husband called EMS after syncopal episode. Pt found on floor vomiting. Pt denies taking her BP medication before bed like she normally does. 4mg  Zofran given EMS. No obvious injuries denies pain. BP 212/120, P80, R18, CBG 194.

## 2013-03-31 NOTE — ED Notes (Signed)
Report given at 0645, unable to transport pt at time of report given. Transporting pt now.

## 2013-03-31 NOTE — Progress Notes (Signed)
Utilization review completed. Lamarr Feenstra, RN, BSN. 

## 2013-03-31 NOTE — H&P (Signed)
[facility] History and Physical  Destiny Day [id number] DOB: [birth year] DOA: 03/31/2013  Referring physician: ED PCP: Salena Saner., MD   Chief Complaint: Syncope, hematemesis  HPI: Destiny Day is a 62 y.o. female with PMH of malignant HTN, CHF, who presents to the ED.  She had acute onset of dizziness / lightheadedness that started while lying down in bed, she stood up because she was feeling warm (husband states room was very hot actually), after standing up she fell backwards and hit her head on the dresser.  She then was confused for about a minute per husband and eyes rolled to back of head.  After waking up patient began vomiting dark material which was confirmed to be heme positive in the ED.  After treating the patient with Reglan she is feeling much better and has had no additional episodes of emesis for the past 3 hours.  GI has been consulted and feels that patient stable to wait for a couple of hours before she is seen for her EGD (already 5:15 AM at this point), hospitalist has been asked to admit.  Review of Systems: 12 systems reviewed and otherwise negative.  Past Medical History  Diagnosis Date  . CHF (congestive heart failure)   . Hypertension   . Diverticulitis   . Hyperlipidemia   . Asthma   . Diabetes mellitus     insulin dependent  . Arthritis     knee   Past Surgical History  Procedure Laterality Date  . Abdominal hysterectomy     Social History:  reports that she quit smoking about 10 months ago. She has never used smokeless tobacco. She reports that she does not drink alcohol or use illicit drugs.   Allergies  Allergen Reactions  . Lisinopril Cough    No family history on file.  Prior to Admission medications   Medication Sig Start Date End Date Taking? Authorizing Provider  acetaminophen (TYLENOL) 500 MG tablet Take 500 mg by mouth every 6 (six) hours as needed. For pain/fever    Historical Provider, MD   amLODipine-valsartan (EXFORGE) 10-320 MG per tablet Take 1 tablet by mouth daily. 08/26/12   Ripudeep Krystal Eaton, MD  aspirin 81 MG chewable tablet Chew 81 mg by mouth daily.    Historical Provider, MD  benzonatate (TESSALON) 100 MG capsule Take 1 capsule (100 mg total) by mouth 3 (three) times daily. X 10 days 08/26/12   Ripudeep Krystal Eaton, MD  carvedilol (COREG) 25 MG tablet Take 1 tablet (25 mg total) by mouth 2 (two) times daily with a meal. 08/26/12   Ripudeep Krystal Eaton, MD  chlorpheniramine-HYDROcodone (TUSSIONEX PENNKINETIC ER) 10-8 MG/5ML LQCR Take 5 mLs by mouth every 12 (twelve) hours. Take 5 mLs by mouth every 12 (twelve) hours. 10/07/11   Ruthell Rummage Dammen, PA-C  cloNIDine (CATAPRES - DOSED IN MG/24 HR) 0.2 mg/24hr patch Place 1 patch (0.2 mg total) onto the skin once a week. On Thursdays 08/26/12   Ripudeep Krystal Eaton, MD  furosemide (LASIX) 40 MG tablet Take 1 tablet (40 mg total) by mouth 2 (two) times daily. 08/26/12   Ripudeep Krystal Eaton, MD  hydrALAZINE (APRESOLINE) 100 MG tablet Take 100 mg by mouth every 8 (eight) hours. 08/23/11 08/22/12  Wenda Low, MD  hydrALAZINE (APRESOLINE) 100 MG tablet Take 1 tablet (100 mg total) by mouth 3 (three) times daily. 08/26/12 08/26/13  Ripudeep Krystal Eaton, MD  insulin NPH-insulin regular (NOVOLIN 70/30) (70-30) 100 UNIT/ML injection Inject 8 Units into  the skin 2 (two) times daily with a meal. 08/26/12   Ripudeep K Rai, MD  potassium chloride SA (K-DUR,KLOR-CON) 20 MEQ tablet Take 20 mEq by mouth daily.    Historical Provider, MD  simvastatin (ZOCOR) 40 MG tablet Take 40 mg by mouth every evening.    Historical Provider, MD   Physical Exam: Filed Vitals:   03/31/13 0315  BP: 172/81  Pulse: 70  Temp:   Resp: 19    General:  NAD, resting comfortably in bed Eyes: PEERLA EOMI ENT: mucous membranes moist Neck: supple w/o JVD Cardiovascular: RRR w/o MRG Respiratory: CTA B Abdomen: soft, nt, nd, bs+ Skin: no rash nor lesion Musculoskeletal: MAE, full ROM all 4  extremities Psychiatric: normal tone and affect Neurologic: AAOx3, grossly non-focal  Labs on Admission:  Basic Metabolic Panel:  Recent Labs Lab 03/31/13 0225  NA 136  K 2.4*  CL 94*  CO2 29  GLUCOSE 127*  BUN 28*  CREATININE 1.55*  CALCIUM 9.6   Liver Function Tests:  Recent Labs Lab 03/31/13 0225  AST 20  ALT 17  ALKPHOS 142*  BILITOT 0.1*  PROT 7.4  ALBUMIN 3.0*   No results found for this basename: LIPASE, AMYLASE,  in the last 168 hours No results found for this basename: AMMONIA,  in the last 168 hours CBC:  Recent Labs Lab 03/31/13 0225  WBC 9.3  NEUTROABS 4.4  HGB 11.8*  HCT 33.2*  MCV 83.8  PLT 402*   Cardiac Enzymes: No results found for this basename: CKTOTAL, CKMB, CKMBINDEX, TROPONINI,  in the last 168 hours  BNP (last 3 results)  Recent Labs  08/23/12 2335  PROBNP 7978.0*   CBG: No results found for this basename: GLUCAP,  in the last 168 hours  Radiological Exams on Admission: Ct Head Wo Contrast  03/31/2013   *RADIOLOGY REPORT*  Clinical Data: Dizziness, vomiting  CT HEAD WITHOUT CONTRAST  Technique:  Contiguous axial images were obtained from the base of the skull through the vertex without contrast.  Comparison: None.  Findings: There is no acute intracranial hemorrhage or infarct.  No mass lesion or midline shift.  Subtle calcification is noted within the basal ganglia bilaterally.  Gray-white matter differentiation is maintained.  No midline shift or mass lesion.  No extra-axial fluid collection.  Calvarium is intact.  Orbital soft tissues are normal.  There is scattered mucosal thickening within the ethmoidal air cells bilaterally.  Paranasal sinuses are otherwise clear.  Mastoid air cells well pneumatized.  IMPRESSION: No acute intracranial process.   Original Report Authenticated By: Jeannine Boga, M.D.   Dg Chest Portable 1 View  03/31/2013   *RADIOLOGY REPORT*  Clinical Data: Vomiting.  PORTABLE CHEST - 1 VIEW  Comparison:  08/25/2012.  Findings: Mild cardiomegaly is unchanged from prior, as are upper mediastinal contours.  Aortic arch atherosclerosis.  No infiltrate, edema, effusion, pneumothorax.  Glenohumeral and acromioclavicular osteoarthritis, right more than left.  IMPRESSION: No evidence of acute cardiopulmonary disease.   Original Report Authenticated By: Jorje Guild    EKG: Independently reviewed.  Assessment/Plan Principal Problem:   UGI bleed Active Problems:   DIABETES MELLITUS, TYPE II   HTN (hypertension), malignant   1. UGI bleed - GI consulted, EGD in a couple of hours hopefully, no further vomiting for past 3 hours, admit to tele monitor, repeat HGB to see how much blood loss she actually has had, give blood if needed, appears hemodynamically stable at this point.  PPI bolus and gtt. 2.  DM 2 - NPO and on low dose SSI 3. HTN - continue home meds other than lasix    Code Status: Full Code (must indicate code status--if unknown or must be presumed, indicate so) Family Communication: Spoke with husband at bedside (indicate person spoken with, if applicable, with phone number if by telephone) Disposition Plan: Admit to inpatient (indicate anticipated LOS)  Time spent: 70 min  Lizmarie Witters M. [facility] Pager [phone]  If 7PM-7AM, please contact night-coverage [email or web address] Password [email or web address] 03/31/2013, 5:15 AM

## 2013-03-31 NOTE — ED Provider Notes (Signed)
CSN: [id number]     Arrival date & time 03/31/13  0200 History   First MD Initiated Contact with Patient 03/31/13 0206     No chief complaint on file.  (Consider location/radiation/quality/duration/timing/severity/associated sxs/prior Treatment) HPI Comments: 62 year old female with acute dizziness/lightheadedness that started while she was laying in bed. She got up because she was feeling warm and dizzy and fell backwards towards a dresser. Her husband heard this and got up and found her slumped over at the dresser. He states she was confused for about a minute and believes she passed out. Since that time patient has been having vomiting of dark material. She denies any headaches, blurry vision, numbness, weakness, neck pain, chest pain or abdominal pain. She's not had any melanotic or bloody stools. She is up her blood pressure in the past and EMS her blood pressure was over 200.  The history is provided by the patient and the spouse.    Past Medical History  Diagnosis Date  . CHF (congestive heart failure)   . Hypertension   . Diverticulitis   . Hyperlipidemia   . Asthma   . Diabetes mellitus     insulin dependent  . Arthritis     knee   Past Surgical History  Procedure Laterality Date  . Abdominal hysterectomy     No family history on file. History  Substance Use Topics  . Smoking status: Former Smoker    Quit date: 05/10/2012  . Smokeless tobacco: Never Used  . Alcohol Use: No   OB History   Grav Para Term Preterm Abortions TAB SAB Ect Mult Living                 Review of Systems  Constitutional: Positive for diaphoresis. Negative for fever and chills.  Respiratory: Negative for shortness of breath.   Cardiovascular: Negative for chest pain.  Gastrointestinal: Positive for nausea and vomiting. Negative for abdominal pain and blood in stool.  Neurological: Positive for dizziness, syncope and light-headedness. Negative for weakness and headaches.  All other systems  reviewed and are negative.    Allergies  Lisinopril  Home Medications   Current Outpatient Rx  Name  Route  Sig  Dispense  Refill  . acetaminophen (TYLENOL) 500 MG tablet   Oral   Take 500 mg by mouth every 6 (six) hours as needed. For pain/fever         . amLODipine-valsartan (EXFORGE) 10-320 MG per tablet   Oral   Take 1 tablet by mouth daily.   30 tablet   3   . aspirin 81 MG chewable tablet   Oral   Chew 81 mg by mouth daily.         . benzonatate (TESSALON) 100 MG capsule   Oral   Take 1 capsule (100 mg total) by mouth 3 (three) times daily. X 10 days   30 capsule   0   . carvedilol (COREG) 25 MG tablet   Oral   Take 1 tablet (25 mg total) by mouth 2 (two) times daily with a meal.   60 tablet   3   . chlorpheniramine-HYDROcodone (TUSSIONEX PENNKINETIC ER) 10-8 MG/5ML LQCR   Oral   Take 5 mLs by mouth every 12 (twelve) hours. Take 5 mLs by mouth every 12 (twelve) hours.   140 mL   0   . cloNIDine (CATAPRES - DOSED IN MG/24 HR) 0.2 mg/24hr patch   Transdermal   Place 1 patch (0.2 mg total) onto the skin  once a week. On Thursdays   4 patch   3   . furosemide (LASIX) 40 MG tablet   Oral   Take 1 tablet (40 mg total) by mouth 2 (two) times daily.   60 tablet   3   . EXPIRED: hydrALAZINE (APRESOLINE) 100 MG tablet   Oral   Take 100 mg by mouth every 8 (eight) hours.         . hydrALAZINE (APRESOLINE) 100 MG tablet   Oral   Take 1 tablet (100 mg total) by mouth 3 (three) times daily.   90 tablet   3   . insulin NPH-insulin regular (NOVOLIN 70/30) (70-30) 100 UNIT/ML injection   Subcutaneous   Inject 8 Units into the skin 2 (two) times daily with a meal.   10 mL   3   . potassium chloride SA (K-DUR,KLOR-CON) 20 MEQ tablet   Oral   Take 20 mEq by mouth daily.         . simvastatin (ZOCOR) 40 MG tablet   Oral   Take 40 mg by mouth every evening.          BP 226/103  Pulse 78  Temp(Src) 97.5 F (36.4 C) (Oral)  Resp 18  SpO2  100% Physical Exam  Nursing note and vitals reviewed. Constitutional: She is oriented to person, place, and time. She appears well-developed and well-nourished.  vomiting  HENT:  Head: Normocephalic and atraumatic.  Right Ear: External ear normal.  Left Ear: External ear normal.  Nose: Nose normal.  Eyes: EOM are normal. Pupils are equal, round, and reactive to light. Right eye exhibits no discharge. Left eye exhibits no discharge.  Cardiovascular: Normal rate, regular rhythm and normal heart sounds.   Pulmonary/Chest: Effort normal and breath sounds normal.  Abdominal: Soft. There is no tenderness.  Neurological: She is alert and oriented to person, place, and time. She has normal strength. No cranial nerve deficit. Coordination normal. GCS eye subscore is 4. GCS verbal subscore is 5. GCS motor subscore is 6.  Skin: Skin is warm and dry.    ED Course  Procedures (including critical care time) Labs Review Labs Reviewed  OCCULT BLOOD GASTRIC / DUODENUM (SPECIMEN CUP) - Abnormal; Notable for the following:    Occult Blood, Gastric POSITIVE (*)    All other components within normal limits  CBC WITH DIFFERENTIAL - Abnormal; Notable for the following:    Hemoglobin 11.8 (*)    HCT 33.2 (*)    Platelets 402 (*)    Lymphs Abs 4.2 (*)    All other components within normal limits  BASIC METABOLIC PANEL - Abnormal; Notable for the following:    Potassium 2.4 (*)    Chloride 94 (*)    Glucose, Bld 127 (*)    BUN 28 (*)    Creatinine, Ser 1.55 (*)    GFR calc non Af Amer 35 (*)    GFR calc Af Amer 40 (*)    All other components within normal limits  HEPATIC FUNCTION PANEL - Abnormal; Notable for the following:    Albumin 3.0 (*)    Alkaline Phosphatase 142 (*)    Total Bilirubin 0.1 (*)    All other components within normal limits  PROTIME-INR - Abnormal; Notable for the following:    Prothrombin Time 11.2 (*)    All other components within normal limits  APTT  POCT I-STAT  TROPONIN I  TYPE AND SCREEN   Imaging Review Ct Head Wo Contrast  03/31/2013   *RADIOLOGY REPORT*  Clinical Data: Dizziness, vomiting  CT HEAD WITHOUT CONTRAST  Technique:  Contiguous axial images were obtained from the base of the skull through the vertex without contrast.  Comparison: None.  Findings: There is no acute intracranial hemorrhage or infarct.  No mass lesion or midline shift.  Subtle calcification is noted within the basal ganglia bilaterally.  Gray-white matter differentiation is maintained.  No midline shift or mass lesion.  No extra-axial fluid collection.  Calvarium is intact.  Orbital soft tissues are normal.  There is scattered mucosal thickening within the ethmoidal air cells bilaterally.  Paranasal sinuses are otherwise clear.  Mastoid air cells well pneumatized.  IMPRESSION: No acute intracranial process.   Original Report Authenticated By: Jeannine Boga, M.D.   Dg Chest Portable 1 View  03/31/2013   *RADIOLOGY REPORT*  Clinical Data: Vomiting.  PORTABLE CHEST - 1 VIEW  Comparison: 08/25/2012.  Findings: Mild cardiomegaly is unchanged from prior, as are upper mediastinal contours.  Aortic arch atherosclerosis.  No infiltrate, edema, effusion, pneumothorax.  Glenohumeral and acromioclavicular osteoarthritis, right more than left.  IMPRESSION: No evidence of acute cardiopulmonary disease.   Original Report Authenticated By: Jorje Guild    MDM   1. Hematemesis   2. Syncope   3. Hypertension    New onset of hematemesis tonight. Neuro exam is normal, and head CT is normal as well. Unlikely to be stroke as well as acute GI bleed. Vomiting stopped after multiple doses of zofran as well as protonix and reglan. Benign abd exam. Not on blood thinners. HTN likely related to acute vomiting, her BP normalized after she stopped vomiting. D/w GI and hospitalist, will admit to step down and get scope later today.     Ephraim Hamburger, MD 03/31/13 [phone]

## 2013-03-31 NOTE — Progress Notes (Signed)
Pt rec for ED. PT O4x, no complaints of Pain BP in 200s med aware, PO BP meds giving. Will continue to monitor

## 2013-04-01 DIAGNOSIS — E119 Type 2 diabetes mellitus without complications: Secondary | ICD-10-CM

## 2013-04-01 LAB — BASIC METABOLIC PANEL
BUN: 34 mg/dL — ABNORMAL HIGH (ref 6–23)
CO2: 27 mEq/L (ref 19–32)
Calcium: 8.7 mg/dL (ref 8.4–10.5)
Chloride: 92 mEq/L — ABNORMAL LOW (ref 96–112)
Creatinine, Ser: 2.16 mg/dL — ABNORMAL HIGH (ref 0.50–1.10)
GFR calc Af Amer: 27 mL/min — ABNORMAL LOW (ref >90)
GFR calc non Af Amer: 23 mL/min — ABNORMAL LOW (ref >90)
Glucose, Bld: 318 mg/dL — ABNORMAL HIGH (ref 70–99)
Potassium: 3.7 mEq/L (ref 3.5–5.1)
Sodium: 130 mEq/L — ABNORMAL LOW (ref 135–145)

## 2013-04-01 LAB — GLUCOSE, CAPILLARY
Glucose-Capillary: 195 mg/dL — ABNORMAL HIGH (ref 70–99)
Glucose-Capillary: 286 mg/dL — ABNORMAL HIGH (ref 70–99)
Glucose-Capillary: 165 mg/dL — ABNORMAL HIGH (ref 70–99)

## 2013-04-01 LAB — IRON AND TIBC
Iron: 33 ug/dL — ABNORMAL LOW (ref 42–135)
Saturation Ratios: 15 % — ABNORMAL LOW (ref 20–55)
TIBC: 227 ug/dL — ABNORMAL LOW (ref 250–470)
UIBC: 194 ug/dL (ref 125–400)

## 2013-04-01 LAB — VITAMIN B12: Vitamin B-12: 419 pg/mL (ref 211–911)

## 2013-04-01 LAB — FERRITIN: Ferritin: 124 ng/mL (ref 10–291)

## 2013-04-01 MED ORDER — POTASSIUM CHLORIDE CRYS ER 20 MEQ PO TBCR
60.0000 meq | EXTENDED_RELEASE_TABLET | Freq: Two times a day (BID) | ORAL | Status: DC
  Administered 2013-04-01: 60 meq via ORAL
  Filled 2013-04-01: qty 3

## 2013-04-01 NOTE — Progress Notes (Signed)
[facility] Progress Note  Subjective:  Ate and tolerated breakfast.  No nausea or vomiting.  Feels well.  Objective:  Vital signs in last 24 hours: Temp:  [98.1 F (36.7 C)-98.7 F (37.1 C)] 98.2 F (36.8 C) (09/06 0448) Pulse Rate:  [73-84] 74 (09/06 0448) Resp:  [14-18] 18 (09/06 0448) BP: (104-196)/(63-108) 131/68 mmHg (09/06 0448) SpO2:  [98 %-100 %] 99 % (09/06 0448) Weight:  [142 lb 8 oz (64.638 kg)] 142 lb 8 oz (64.638 kg) (09/06 0448) Last BM Date: 03/31/13 General:   Alert,Well-developed, in NAD Heart:  Regular rate and rhythm; no murmurs Pulm:  CTAB.  No W/R/R. Abdomen:  Soft, non-tender and non-distended. Normal bowel sounds. Extremities:  Without edema. Neurologic:  Alert and  oriented x4;  grossly normal neurologically. Psych:  Alert and cooperative. Normal mood and affect.  Intake/Output from previous day: 09/05 0701 - 09/06 0700 In: 1047.1 [P.O.:300; I.V.:307.1; IV Piggyback:200] Out: 975 [Urine:975]  Lab Results:  Recent Labs  03/31/13 0225 03/31/13 0517  WBC 9.3 9.1  HGB 11.8* 11.1*  HCT 33.2* 31.5*  PLT 402* 394   BMET  Recent Labs  03/31/13 0225  NA 136  K 2.4*  CL 94*  CO2 29  GLUCOSE 127*  BUN 28*  CREATININE 1.55*  CALCIUM 9.6   LFT  Recent Labs  03/31/13 0225  PROT 7.4  ALBUMIN 3.0*  AST 20  ALT 17  ALKPHOS 142*  BILITOT 0.1*  BILIDIR <0.1  IBILI NOT CALCULATED   PT/INR  Recent Labs  03/31/13 0225  LABPROT 11.2*  INR 0.82   Assessment / Plan: -Hematemesis:  Possibly secondary to diffuse gastropathy seen on EGD 9/5.  Hgb stable this AM.  Follow-up biopsy results.  Await results of iron and Vitamin B12 studies.  [place] for discharge from GI standpoint.   LOS: 1 day   ZEHR, JESSICA D.  04/01/2013, 8:51 AM  Pager number [phone]   [place] Attending  I have also seen and assessed the patient and agree with the above note. Will call her w/ pathology results and also arrange a screening  colonoscopy.  Gatha Mayer, MD, [facility] [phone] (pager) 04/01/2013 10:44 AM

## 2013-04-01 NOTE — Discharge Summary (Signed)
Physician Discharge Summary  Destiny Day [id number] DOB: [birth year] DOA: 03/31/2013  PCP: Salena Saner., MD  Admit date: 03/31/2013 Discharge date: 04/01/2013  Time spent: 35 minutes  Recommendations for Outpatient Follow-up:  1. Follow up with in 4 weeks.  Discharge Diagnoses:  Principal Problem:   UGI bleed Active Problems:   DIABETES MELLITUS, TYPE II   HTN (hypertension), malignant   Discharge Condition: stable  Diet recommendation: cab modified   Filed Weights   03/31/13 0742 03/31/13 0747 04/01/13 0448  Weight: 63.821 kg (140 lb 11.2 oz) 63.821 kg (140 lb 11.2 oz) 64.638 kg (142 lb 8 oz)    History of present illness:  62 y.o. female with PMH of malignant HTN, CHF, who presents to the ED. She had acute onset of dizziness / lightheadedness that started while lying down in bed, she stood up because she was feeling warm (husband states room was very hot actually), after standing up she fell backwards and hit her head on the dresser. She then was confused for about a minute per husband and eyes rolled to back of head. After waking up patient began vomiting dark material which was confirmed to be heme positive in the ED. After treating the patient with Reglan she is feeling much better and has had no additional episodes of emesis for the past 3 hours. GI has been consulted and feels that patient stable to wait for a couple of hours before she is seen for her EGD (already 5:15 AM at this point), hospitalist has been asked to admit.   Hospital Course:  UGI bleed:  - protonix drip. Hbg stable. No further vomiting.  - EGD on 9.5.2014: Gastropathy was found in the gastric fundus; multiple biopsies - this could have been a result of vomiting and a source of bleeding but not a source for major bleeding - GI consulted. No use of NSAID's or steroids.   HTN (hypertension), malignant:  - resume home meds.   DIABETES MELLITUS, TYPE II  - resume home meds.  Hypokalemia:  -  due to N/V, replete.   Procedures:  EGD  Consultations:  GI  Discharge Exam: Filed Vitals:   04/01/13 0448  BP: 131/68  Pulse: 74  Temp: 98.2 F (36.8 C)  Resp: 18    General: A&O x3 Cardiovascular: RRR Respiratory: good air movement CTA B/L  Discharge Instructions  Discharge Orders   Future Orders Complete By Expires   Diet - low sodium heart healthy  As directed    Increase activity slowly  As directed        Medication List         acetaminophen 500 MG tablet  Commonly known as:  TYLENOL  Take 500 mg by mouth every 6 (six) hours as needed. For pain/fever     aspirin 81 MG chewable tablet  Chew 81 mg by mouth daily.     carvedilol 25 MG tablet  Commonly known as:  COREG  Take 1 tablet (25 mg total) by mouth 2 (two) times daily with a meal.     chlorthalidone 25 MG tablet  Commonly known as:  HYGROTON  Take 25 mg by mouth every evening.     cloNIDine 0.2 mg/24hr patch  Commonly known as:  CATAPRES - Dosed in mg/24 hr  Place 1 patch (0.2 mg total) onto the skin once a week. On Thursdays     furosemide 40 MG tablet  Commonly known as:  LASIX  Take 1 tablet (40 mg total)  by mouth 2 (two) times daily.     hydrALAZINE 100 MG tablet  Commonly known as:  APRESOLINE  Take 1 tablet (100 mg total) by mouth 3 (three) times daily.     insulin NPH-regular (70-30) 100 UNIT/ML injection  Commonly known as:  NOVOLIN 70/30  Inject 8 Units into the skin 2 (two) times daily with a meal.     metFORMIN 500 MG tablet  Commonly known as:  GLUCOPHAGE  Take 1,000 mg by mouth 2 (two) times daily with a meal.     potassium chloride SA 20 MEQ tablet  Commonly known as:  K-DUR,KLOR-CON  Take 20 mEq by mouth daily.     simvastatin 40 MG tablet  Commonly known as:  ZOCOR  Take 40 mg by mouth every evening.       Allergies  Allergen Reactions  . Lisinopril Cough       Follow-up Information   Follow up with Silvano Rusk, MD In 2 weeks. (hospital follow up)     Specialty:  Gastroenterology   Contact information:   [address] [place] [address] [phone]        The results of significant diagnostics from this hospitalization (including imaging, microbiology, ancillary and laboratory) are listed below for reference.    Significant Diagnostic Studies: Ct Head Wo Contrast  03/31/2013   *RADIOLOGY REPORT*  Clinical Data: Dizziness, vomiting  CT HEAD WITHOUT CONTRAST  Technique:  Contiguous axial images were obtained from the base of the skull through the vertex without contrast.  Comparison: None.  Findings: There is no acute intracranial hemorrhage or infarct.  No mass lesion or midline shift.  Subtle calcification is noted within the basal ganglia bilaterally.  Gray-white matter differentiation is maintained.  No midline shift or mass lesion.  No extra-axial fluid collection.  Calvarium is intact.  Orbital soft tissues are normal.  There is scattered mucosal thickening within the ethmoidal air cells bilaterally.  Paranasal sinuses are otherwise clear.  Mastoid air cells well pneumatized.  IMPRESSION: No acute intracranial process.   Original Report Authenticated By: Jeannine Boga, M.D.   Dg Chest Portable 1 View  03/31/2013   *RADIOLOGY REPORT*  Clinical Data: Vomiting.  PORTABLE CHEST - 1 VIEW  Comparison: 08/25/2012.  Findings: Mild cardiomegaly is unchanged from prior, as are upper mediastinal contours.  Aortic arch atherosclerosis.  No infiltrate, edema, effusion, pneumothorax.  Glenohumeral and acromioclavicular osteoarthritis, right more than left.  IMPRESSION: No evidence of acute cardiopulmonary disease.   Original Report Authenticated By: Jorje Guild    Microbiology: No results found for this or any previous visit (from the past 240 hour(s)).   Labs: Basic Metabolic Panel:  Recent Labs Lab 03/31/13 0225  NA 136  K 2.4*  CL 94*  CO2 29  GLUCOSE 127*  BUN 28*  CREATININE 1.55*  CALCIUM 9.6   Liver Function  Tests:  Recent Labs Lab 03/31/13 0225  AST 20  ALT 17  ALKPHOS 142*  BILITOT 0.1*  PROT 7.4  ALBUMIN 3.0*   No results found for this basename: LIPASE, AMYLASE,  in the last 168 hours No results found for this basename: AMMONIA,  in the last 168 hours CBC:  Recent Labs Lab 03/31/13 0225 03/31/13 0517  WBC 9.3 9.1  NEUTROABS 4.4  --   HGB 11.8* 11.1*  HCT 33.2* 31.5*  MCV 83.8 84.2  PLT 402* 394   Cardiac Enzymes: No results found for this basename: CKTOTAL, CKMB, CKMBINDEX, TROPONINI,  in the  last 168 hours BNP: BNP (last 3 results)  Recent Labs  08/23/12 2335  PROBNP 7978.0*   CBG:  Recent Labs Lab 03/31/13 1341 03/31/13 1827 03/31/13 2024 03/31/13 2350 04/01/13 0356  GLUCAP 195* 178* 315* 286* 195*       Signed:  Charlynne Cousins  [facility] 04/01/2013, 8:56 AM

## 2013-04-03 ENCOUNTER — Encounter (HOSPITAL_COMMUNITY): Payer: Self-pay | Admitting: Internal Medicine

## 2013-04-04 NOTE — Progress Notes (Signed)
Quick Note:  Let her know the biopsies showed minmila stomach inflammation - not a problem in my mind As long as she is doing ok/feeling better let's have her set up a screening colonoscopy (pre-visit, etc)  If still having problems then Office visit me or APP   ______

## 2013-04-12 ENCOUNTER — Telehealth: Payer: Self-pay | Admitting: *Deleted

## 2013-04-12 ENCOUNTER — Ambulatory Visit: Payer: BC Managed Care – PPO | Admitting: *Deleted

## 2013-04-12 VITALS — Ht 64.0 in | Wt 141.8 lb

## 2013-04-12 DIAGNOSIS — Z1211 Encounter for screening for malignant neoplasm of colon: Secondary | ICD-10-CM

## 2013-04-12 DIAGNOSIS — E871 Hypo-osmolality and hyponatremia: Secondary | ICD-10-CM

## 2013-04-12 DIAGNOSIS — N289 Disorder of kidney and ureter, unspecified: Secondary | ICD-10-CM

## 2013-04-12 MED ORDER — NA SULFATE-K SULFATE-MG SULF 17.5-3.13-1.6 GM/177ML PO SOLN: 1.0000 | Freq: Once | ORAL | Status: DC

## 2013-04-12 NOTE — Progress Notes (Signed)
No egg or soy allergy. No anesthesia problems.  

## 2013-04-12 NOTE — Telephone Encounter (Signed)
Pt was seen in pre-visit today for Direct colon on 04/21/13. Pt has CHF and is on 2 liter/day fluid restriction. Per prep instructions, we advise  to drink at least 64 oz before they begin their prep,she was given suprep instructions which would add another 48 oz to her fluid intake, Completed pre-visit. Her EF was 60-65% January 2014. On 03/31/13 she had EGD by you.  Any different instruction for fluid intake for procedure on 04/21/13? Does pt need to be seen in office? Please review chart and advise-adm

## 2013-04-12 NOTE — Telephone Encounter (Signed)
Pt was seen in pre-visit today for Direct colon on 04/21/13. Pt has CHF and is on 2 liter/day fluid restriction. Per prep instructions, we advise to drink at least 64 oz before they begin their prep,she was given suprep instructions which would add another 48 oz to her fluid intake, Completed pre-visit. Her EF was 60-65% January 2014. On 03/31/13 she had EGD by you. Any different instruction for fluid intake for procedure on 04/21/13? Does pt need to be seen in office? Please review chart and advise-adm

## 2013-04-13 ENCOUNTER — Inpatient Hospital Stay (HOSPITAL_COMMUNITY)
Admission: EM | Admit: 2013-04-13 | Discharge: 2013-04-14 | DRG: 566 | Disposition: A | Discharge status: Home / Self Care | Payer: BC Managed Care – PPO | Attending: Internal Medicine | Admitting: Internal Medicine

## 2013-04-13 ENCOUNTER — Encounter: Payer: Self-pay | Admitting: Internal Medicine

## 2013-04-13 ENCOUNTER — Other Ambulatory Visit (INDEPENDENT_AMBULATORY_CARE_PROVIDER_SITE_OTHER): Payer: BC Managed Care – PPO

## 2013-04-13 ENCOUNTER — Encounter (HOSPITAL_COMMUNITY): Payer: Self-pay | Admitting: *Deleted

## 2013-04-13 DIAGNOSIS — E876 Hypokalemia: Secondary | ICD-10-CM

## 2013-04-13 DIAGNOSIS — I509 Heart failure, unspecified: Secondary | ICD-10-CM | POA: Diagnosis present

## 2013-04-13 DIAGNOSIS — J45909 Unspecified asthma, uncomplicated: Secondary | ICD-10-CM | POA: Diagnosis present

## 2013-04-13 DIAGNOSIS — E871 Hypo-osmolality and hyponatremia: Secondary | ICD-10-CM

## 2013-04-13 DIAGNOSIS — Z5989 Other problems related to housing and economic circumstances: Secondary | ICD-10-CM

## 2013-04-13 DIAGNOSIS — E785 Hyperlipidemia, unspecified: Secondary | ICD-10-CM | POA: Diagnosis present

## 2013-04-13 DIAGNOSIS — Z7982 Long term (current) use of aspirin: Secondary | ICD-10-CM

## 2013-04-13 DIAGNOSIS — Z79899 Other long term (current) drug therapy: Secondary | ICD-10-CM

## 2013-04-13 DIAGNOSIS — Z794 Long term (current) use of insulin: Secondary | ICD-10-CM

## 2013-04-13 DIAGNOSIS — I10 Essential (primary) hypertension: Secondary | ICD-10-CM

## 2013-04-13 DIAGNOSIS — N183 Chronic kidney disease, stage 3 unspecified: Secondary | ICD-10-CM | POA: Diagnosis present

## 2013-04-13 DIAGNOSIS — E870 Hyperosmolality and hypernatremia: Secondary | ICD-10-CM | POA: Diagnosis present

## 2013-04-13 DIAGNOSIS — E1101 Type 2 diabetes mellitus with hyperosmolarity with coma: Principal | ICD-10-CM | POA: Diagnosis present

## 2013-04-13 DIAGNOSIS — Z23 Encounter for immunization: Secondary | ICD-10-CM

## 2013-04-13 DIAGNOSIS — I5032 Chronic diastolic (congestive) heart failure: Secondary | ICD-10-CM

## 2013-04-13 DIAGNOSIS — N289 Disorder of kidney and ureter, unspecified: Secondary | ICD-10-CM

## 2013-04-13 DIAGNOSIS — Z5987 Material hardship due to limited financial resources, not elsewhere classified: Secondary | ICD-10-CM

## 2013-04-13 DIAGNOSIS — E1100 Type 2 diabetes mellitus with hyperosmolarity without nonketotic hyperglycemic-hyperosmolar coma (NKHHC): Secondary | ICD-10-CM

## 2013-04-13 DIAGNOSIS — R739 Hyperglycemia, unspecified: Secondary | ICD-10-CM

## 2013-04-13 DIAGNOSIS — M810 Age-related osteoporosis without current pathological fracture: Secondary | ICD-10-CM | POA: Diagnosis present

## 2013-04-13 DIAGNOSIS — E119 Type 2 diabetes mellitus without complications: Secondary | ICD-10-CM

## 2013-04-13 DIAGNOSIS — M1710 Unilateral primary osteoarthritis, unspecified knee: Secondary | ICD-10-CM | POA: Diagnosis present

## 2013-04-13 DIAGNOSIS — E1169 Type 2 diabetes mellitus with other specified complication: Secondary | ICD-10-CM | POA: Diagnosis present

## 2013-04-13 DIAGNOSIS — Z598 Other problems related to housing and economic circumstances: Secondary | ICD-10-CM

## 2013-04-13 DIAGNOSIS — J309 Allergic rhinitis, unspecified: Secondary | ICD-10-CM

## 2013-04-13 DIAGNOSIS — I129 Hypertensive chronic kidney disease with stage 1 through stage 4 chronic kidney disease, or unspecified chronic kidney disease: Secondary | ICD-10-CM | POA: Diagnosis present

## 2013-04-13 DIAGNOSIS — Z87891 Personal history of nicotine dependence: Secondary | ICD-10-CM

## 2013-04-13 DIAGNOSIS — I5033 Acute on chronic diastolic (congestive) heart failure: Secondary | ICD-10-CM | POA: Diagnosis present

## 2013-04-13 LAB — BASIC METABOLIC PANEL
BUN: 22 mg/dL (ref 6–23)
CO2: 32 mEq/L (ref 19–32)
Calcium: 8.7 mg/dL (ref 8.4–10.5)
Chloride: 89 mEq/L — ABNORMAL LOW (ref 96–112)
Creatinine, Ser: 1.6 mg/dL — ABNORMAL HIGH (ref 0.4–1.2)
GFR: 40.81 mL/min — ABNORMAL LOW (ref >60.00)
Glucose, Bld: 636 mg/dL (ref 70–99)
Potassium: 3.1 mEq/L — ABNORMAL LOW (ref 3.5–5.1)
Sodium: 128 mEq/L — ABNORMAL LOW (ref 135–145)

## 2013-04-13 LAB — CBC
HCT: 31.2 % — ABNORMAL LOW (ref 36.0–46.0)
Hemoglobin: 11.2 g/dL — ABNORMAL LOW (ref 12.0–15.0)
MCH: 29.8 pg (ref 26.0–34.0)
MCHC: 35.9 g/dL (ref 30.0–36.0)
MCV: 83 fL (ref 78.0–100.0)
Platelets: 404 10*3/uL — ABNORMAL HIGH (ref 150–400)
RBC: 3.76 MIL/uL — ABNORMAL LOW (ref 3.87–5.11)
RDW: 11.9 % (ref 11.5–15.5)
WBC: 8.7 10*3/uL (ref 4.0–10.5)

## 2013-04-13 LAB — URINALYSIS, ROUTINE W REFLEX MICROSCOPIC
Bilirubin Urine: NEGATIVE
Glucose, UA: >1000 mg/dL — AB
Ketones, ur: NEGATIVE mg/dL
Leukocytes, UA: NEGATIVE
Nitrite: NEGATIVE
Protein, ur: >300 mg/dL — AB
Specific Gravity, Urine: 1.023 (ref 1.005–1.030)
Urobilinogen, UA: 0.2 mg/dL (ref 0.0–1.0)
pH: 5.5 (ref 5.0–8.0)

## 2013-04-13 LAB — POCT I-STAT 3, VENOUS BLOOD GAS (G3P V)
Acid-Base Excess: 6 mmol/L — ABNORMAL HIGH (ref 0.0–2.0)
Bicarbonate: 31.7 mEq/L — ABNORMAL HIGH (ref 20.0–24.0)
O2 Saturation: 99 %
TCO2: 33 mmol/L (ref 0–100)
pCO2, Ven: 47.9 mmHg (ref 45.0–50.0)
pH, Ven: 7.429 — ABNORMAL HIGH (ref 7.250–7.300)
pO2, Ven: 116 mmHg — ABNORMAL HIGH (ref 30.0–45.0)

## 2013-04-13 LAB — URINE MICROSCOPIC-ADD ON

## 2013-04-13 LAB — COMPREHENSIVE METABOLIC PANEL
ALT: 18 U/L (ref 0–35)
AST: 18 U/L (ref 0–37)
Albumin: 2.9 g/dL — ABNORMAL LOW (ref 3.5–5.2)
Alkaline Phosphatase: 177 U/L — ABNORMAL HIGH (ref 39–117)
BUN: 22 mg/dL (ref 6–23)
CO2: 28 mEq/L (ref 19–32)
Calcium: 9.2 mg/dL (ref 8.4–10.5)
Chloride: 81 mEq/L — ABNORMAL LOW (ref 96–112)
Creatinine, Ser: 1.44 mg/dL — ABNORMAL HIGH (ref 0.50–1.10)
GFR calc Af Amer: 44 mL/min — ABNORMAL LOW (ref >90)
GFR calc non Af Amer: 38 mL/min — ABNORMAL LOW (ref >90)
Glucose, Bld: 601 mg/dL (ref 70–99)
Potassium: 2.5 mEq/L — CL (ref 3.5–5.1)
Sodium: 123 mEq/L — ABNORMAL LOW (ref 135–145)
Total Bilirubin: 0.1 mg/dL — ABNORMAL LOW (ref 0.3–1.2)
Total Protein: 7.1 g/dL (ref 6.0–8.3)

## 2013-04-13 LAB — GLUCOSE, CAPILLARY
Glucose-Capillary: >600 mg/dL (ref 70–99)
Glucose-Capillary: 456 mg/dL — ABNORMAL HIGH (ref 70–99)
Glucose-Capillary: 368 mg/dL — ABNORMAL HIGH (ref 70–99)
Glucose-Capillary: 362 mg/dL — ABNORMAL HIGH (ref 70–99)

## 2013-04-13 MED ORDER — FUROSEMIDE 40 MG PO TABS
40.0000 mg | ORAL_TABLET | Freq: Two times a day (BID) | ORAL | Status: DC
  Administered 2013-04-14: 40 mg via ORAL
  Filled 2013-04-13 (×3): qty 1

## 2013-04-13 MED ORDER — IRON 325 (65 FE) MG PO TABS: 1.0000 | ORAL_TABLET | Freq: Every day | ORAL | Status: DC

## 2013-04-13 MED ORDER — SODIUM CHLORIDE 0.9 % IV SOLN
INTRAVENOUS | Status: DC
  Administered 2013-04-13: 6 [IU]/h via INTRAVENOUS

## 2013-04-13 MED ORDER — IRBESARTAN 300 MG PO TABS
300.0000 mg | ORAL_TABLET | Freq: Every day | ORAL | Status: DC
  Administered 2013-04-14: 300 mg via ORAL
  Filled 2013-04-13: qty 1

## 2013-04-13 MED ORDER — SODIUM CHLORIDE 0.9 % IV SOLN: INTRAVENOUS | Status: DC

## 2013-04-13 MED ORDER — ACETAMINOPHEN 650 MG RE SUPP: 650.0000 mg | Freq: Four times a day (QID) | RECTAL | Status: DC | PRN

## 2013-04-13 MED ORDER — CLONIDINE HCL 0.2 MG/24HR TD PTWK
0.2000 mg | MEDICATED_PATCH | TRANSDERMAL | Status: DC
  Administered 2013-04-14: 0.2 mg via TRANSDERMAL
  Filled 2013-04-13: qty 1

## 2013-04-13 MED ORDER — POTASSIUM CHLORIDE CRYS ER 20 MEQ PO TBCR
30.0000 meq | EXTENDED_RELEASE_TABLET | Freq: Once | ORAL | Status: AC
  Administered 2013-04-13: 30 meq via ORAL
  Filled 2013-04-13: qty 1.5

## 2013-04-13 MED ORDER — ONDANSETRON HCL 4 MG PO TABS: 4.0000 mg | ORAL_TABLET | Freq: Four times a day (QID) | ORAL | Status: DC | PRN

## 2013-04-13 MED ORDER — CHOLECALCIFEROL 50 MCG (2000 UT) PO CAPS: 1.0000 | ORAL_CAPSULE | Freq: Every day | ORAL | Status: DC

## 2013-04-13 MED ORDER — SODIUM CHLORIDE 0.9 % IV SOLN
INTRAVENOUS | Status: DC
  Administered 2013-04-13: 3.1 [IU]/h via INTRAVENOUS
  Filled 2013-04-13: qty 1

## 2013-04-13 MED ORDER — SODIUM CHLORIDE 0.9 % IV SOLN
INTRAVENOUS | Status: DC
  Administered 2013-04-13: 23:00:00 via INTRAVENOUS

## 2013-04-13 MED ORDER — ALUM & MAG HYDROXIDE-SIMETH 200-200-20 MG/5ML PO SUSP: 30.0000 mL | Freq: Four times a day (QID) | ORAL | Status: DC | PRN

## 2013-04-13 MED ORDER — CALCIUM 600+D PLUS MINERALS 600-400 MG-UNIT PO TABS: 1.0000 | ORAL_TABLET | Freq: Every day | ORAL | Status: DC

## 2013-04-13 MED ORDER — ONDANSETRON HCL 4 MG/2ML IJ SOLN: 4.0000 mg | Freq: Four times a day (QID) | INTRAMUSCULAR | Status: DC | PRN

## 2013-04-13 MED ORDER — POTASSIUM CHLORIDE 10 MEQ/100ML IV SOLN
10.0000 meq | INTRAVENOUS | Status: DC
  Administered 2013-04-13 (×2): 10 meq via INTRAVENOUS
  Filled 2013-04-13 (×4): qty 100

## 2013-04-13 MED ORDER — ALBUTEROL SULFATE HFA 108 (90 BASE) MCG/ACT IN AERS: 2.0000 | INHALATION_SPRAY | Freq: Four times a day (QID) | RESPIRATORY_TRACT | Status: DC | PRN

## 2013-04-13 MED ORDER — ASPIRIN 81 MG PO CHEW
81.0000 mg | CHEWABLE_TABLET | Freq: Every day | ORAL | Status: DC
  Administered 2013-04-14: 81 mg via ORAL
  Filled 2013-04-13: qty 1

## 2013-04-13 MED ORDER — DEXTROSE 50 % IV SOLN: 25.0000 mL | INTRAVENOUS | Status: DC | PRN

## 2013-04-13 MED ORDER — HYDRALAZINE HCL 20 MG/ML IJ SOLN: 10.0000 mg | Freq: Four times a day (QID) | INTRAMUSCULAR | Status: DC | PRN

## 2013-04-13 MED ORDER — ENOXAPARIN SODIUM 40 MG/0.4ML ~~LOC~~ SOLN
40.0000 mg | SUBCUTANEOUS | Status: DC
  Administered 2013-04-14: 40 mg via SUBCUTANEOUS
  Filled 2013-04-13: qty 0.4

## 2013-04-13 MED ORDER — FERROUS SULFATE 325 (65 FE) MG PO TABS
325.0000 mg | ORAL_TABLET | Freq: Every day | ORAL | Status: DC
  Administered 2013-04-14: 325 mg via ORAL
  Filled 2013-04-13 (×2): qty 1

## 2013-04-13 MED ORDER — ACETAMINOPHEN 325 MG PO TABS: 650.0000 mg | ORAL_TABLET | Freq: Four times a day (QID) | ORAL | Status: DC | PRN

## 2013-04-13 MED ORDER — HYDROCHLOROTHIAZIDE 25 MG PO TABS
25.0000 mg | ORAL_TABLET | Freq: Every day | ORAL | Status: DC
  Administered 2013-04-14: 25 mg via ORAL
  Filled 2013-04-13: qty 1

## 2013-04-13 MED ORDER — CARVEDILOL 25 MG PO TABS
25.0000 mg | ORAL_TABLET | Freq: Two times a day (BID) | ORAL | Status: DC
  Administered 2013-04-14: 25 mg via ORAL
  Filled 2013-04-13 (×3): qty 1

## 2013-04-13 MED ORDER — SODIUM CHLORIDE 0.9 % IJ SOLN
3.0000 mL | Freq: Two times a day (BID) | INTRAMUSCULAR | Status: DC
  Administered 2013-04-14: 3 mL via INTRAVENOUS

## 2013-04-13 MED ORDER — DEXTROSE-NACL 5-0.45 % IV SOLN
INTRAVENOUS | Status: DC
  Administered 2013-04-14: 02:00:00 via INTRAVENOUS

## 2013-04-13 MED ORDER — SODIUM CHLORIDE 0.9 % IV BOLUS (SEPSIS)
1000.0000 mL | Freq: Once | INTRAVENOUS | Status: AC
  Administered 2013-04-13: 1000 mL via INTRAVENOUS

## 2013-04-13 MED ORDER — VITAMIN D3 25 MCG (1000 UNIT) PO TABS
2000.0000 [IU] | ORAL_TABLET | Freq: Every day | ORAL | Status: DC
  Administered 2013-04-14: 2000 [IU] via ORAL
  Filled 2013-04-13: qty 2

## 2013-04-13 MED ORDER — CALCIUM CARBONATE-VITAMIN D 500-200 MG-UNIT PO TABS
1.0000 | ORAL_TABLET | Freq: Every day | ORAL | Status: DC
  Administered 2013-04-14: 09:00:00 1 via ORAL
  Filled 2013-04-13 (×2): qty 1

## 2013-04-13 MED ORDER — INSULIN REGULAR BOLUS VIA INFUSION
0.0000 [IU] | Freq: Three times a day (TID) | INTRAVENOUS | Status: DC
  Filled 2013-04-13: qty 10

## 2013-04-13 MED ORDER — OLMESARTAN-AMLODIPINE-HCTZ 40-5-25 MG PO TABS: 1.0000 | ORAL_TABLET | Freq: Every day | ORAL | Status: DC

## 2013-04-13 MED ORDER — SIMVASTATIN 40 MG PO TABS
40.0000 mg | ORAL_TABLET | Freq: Every evening | ORAL | Status: DC
  Filled 2013-04-13: qty 1

## 2013-04-13 MED ORDER — POTASSIUM CHLORIDE CRYS ER 20 MEQ PO TBCR
40.0000 meq | EXTENDED_RELEASE_TABLET | Freq: Three times a day (TID) | ORAL | Status: DC
  Administered 2013-04-13 – 2013-04-14 (×2): 40 meq via ORAL
  Filled 2013-04-13 (×4): qty 2

## 2013-04-13 NOTE — ED Provider Notes (Signed)
.  Medical screening examination/treatment/procedure(s) were performed by non-physician practitioner and as supervising physician I was immediately available for consultation/collaboration.  [place], DO 04/13/13 2259

## 2013-04-13 NOTE — Progress Notes (Signed)
Quick Note:  Please call her - glucose dangerously high and needs to get to urgent care or ED  Copy PCP please ______

## 2013-04-13 NOTE — ED Notes (Signed)
Placed patient on 5 lead.

## 2013-04-13 NOTE — Progress Notes (Signed)
Quick Note:  We did this as part of evaluation to see if she was going to be ok for colonoscopy prep, etc.  Will need to know she is doing ok re: blood sugars etc before going through with colonoscopy - please have her see me or an APP next week to revisit things. Colonoscopy is 9/26   ______

## 2013-04-13 NOTE — ED Notes (Signed)
Pharmacy notified of missing Potassium PO.  Informed that pyxis is not allowing this RN to remove medication.  Pharmacy stated they would send down missing dose.

## 2013-04-13 NOTE — H&P (Addendum)
PATIENT DETAILS Name: Destiny Day Age: 62 y.o. Sex: female Date of Birth: 1950/10/08 Admit Date: 04/13/2013 [id number] R., MD   CHIEF COMPLAINT:  Hyperglycemia  HPI: Destiny Day is a 62 y.o. female with a Past Medical History of diabetes, chronic diastolic heart failure, uncontrolled hypertension who presents today with the above noted complaint. Patient was recently hospitalized for an upper GI bleed, and was doing well until last week, when she claims she had a hypoglycemic episode. She normally takes insulin 70/3020 units in the morning and 15 units in the evening, because of the hypoglycemic episodes, she cut down to 2 units in the morning and 2 units in the evening. She is currently seeing Dr. Silvano Rusk of [facility] gastroenterology and is scheduled for a colonoscopy, he had ordered some routine labs before the procedure, which showed a blood glucose of more than 600, she was then asked to go to the emergency room for further evaluation and treatment. I was then subsequently asked to admit this patient. During my evaluation, patient was very comfortable, she was awake and alert and not in any distress. She did confirm some symptoms of polyuria and polydipsia. She is now being admitted to the hospitalist service for further evaluation and treatment.   ALLERGIES:   Allergies  Allergen Reactions  . Lisinopril Cough    PAST MEDICAL HISTORY: Past Medical History  Diagnosis Date  . Hypertension   . Diverticulitis   . Hyperlipidemia   . Asthma   . Diabetes mellitus     insulin dependent  . Arthritis     knee  . GI bleed 03/31/2013  . CHF (congestive heart failure)   . Osteoporosis     PAST SURGICAL HISTORY: Past Surgical History  Procedure Laterality Date  . Abdominal hysterectomy    . Esophagogastroduodenoscopy N/A 03/31/2013    Procedure: ESOPHAGOGASTRODUODENOSCOPY (EGD);  Surgeon: Gatha Mayer, MD;  Location: [facility] ENDOSCOPY;  Service: Endoscopy;   Laterality: N/A;    MEDICATIONS AT HOME: Prior to Admission medications   Medication Sig Start Date End Date Taking? Authorizing Provider  albuterol (PROVENTIL HFA;VENTOLIN HFA) 108 (90 BASE) MCG/ACT inhaler Inhale 2 puffs into the lungs every 6 (six) hours as needed for wheezing.   Yes Historical Provider, MD  aspirin 81 MG chewable tablet Chew 81 mg by mouth daily.   Yes Historical Provider, MD  Calcium Carbonate-Vit D-Min (CALCIUM 600+D PLUS MINERALS) 600-400 MG-UNIT TABS Take 1 tablet by mouth daily.   Yes Historical Provider, MD  carvedilol (COREG) 25 MG tablet Take 1 tablet (25 mg total) by mouth 2 (two) times daily with a meal. 08/26/12  Yes Ripudeep K Rai, MD  Cholecalciferol (D3 SUPER STRENGTH) 2000 UNITS CAPS Take 1 capsule by mouth daily.   Yes Historical Provider, MD  cloNIDine (CATAPRES - DOSED IN MG/24 HR) 0.2 mg/24hr patch Place 1 patch (0.2 mg total) onto the skin once a week. On Thursdays 08/26/12  Yes Ripudeep Krystal Eaton, MD  Ferrous Sulfate (IRON) 325 (65 FE) MG TABS Take 1 capsule by mouth daily.   Yes Historical Provider, MD  furosemide (LASIX) 40 MG tablet Take 1 tablet (40 mg total) by mouth 2 (two) times daily. 08/26/12  Yes Ripudeep Krystal Eaton, MD  insulin NPH-insulin regular (NOVOLIN 70/30) (70-30) 100 UNIT/ML injection Inject 8 Units into the skin 2 (two) times daily with a meal. 08/26/12  Yes Ripudeep K Rai, MD  metFORMIN (GLUCOPHAGE) 500 MG tablet Take 1,000 mg by mouth 2 (two) times daily  with a meal.   Yes Historical Provider, MD  Olmesartan-Amlodipine-HCTZ (TRIBENZOR) 40-5-25 MG TABS Take 1 tablet by mouth daily.   Yes Historical Provider, MD  potassium chloride SA (K-DUR,KLOR-CON) 20 MEQ tablet Take 20 mEq by mouth daily.   Yes Historical Provider, MD  simvastatin (ZOCOR) 40 MG tablet Take 40 mg by mouth every evening.   Yes Historical Provider, MD  Na Sulfate-K Sulfate-Mg Sulf (SUPREP BOWEL PREP) SOLN Take 1 kit by mouth once. Name brand only, suprep as directed, no  substitutions 04/12/13   Gatha Mayer, MD    FAMILY HISTORY: Family History  Problem Relation Age of Onset  . Colon cancer Neg Hx     SOCIAL HISTORY:  reports that she quit smoking about 11 months ago. She has never used smokeless tobacco. She reports that she does not drink alcohol or use illicit drugs.  REVIEW OF SYSTEMS:  Constitutional:   No  weight loss, night sweats,  Fevers, chills, fatigue.  HEENT:    No headaches, Difficulty swallowing,Tooth/dental problems,Sore throat,  No sneezing, itching, ear ache, nasal congestion, post nasal drip,   Cardio-vascular: No chest pain,  Orthopnea, PND, swelling in lower extremities, anasarca,         dizziness, palpitations  GI:  No heartburn, indigestion, abdominal pain, nausea, vomiting, diarrhea, change in       bowel habits, loss of appetite  Resp: No shortness of breath with exertion or at rest.  No excess mucus, no productive cough, No non-productive cough,  No coughing up of blood.No change in color of mucus.No wheezing.No chest wall deformity  Skin:  no rash or lesions.  GU:  no dysuria, change in color of urine, no urgency   No flank pain.  Musculoskeletal: No joint pain or swelling.  No decreased range of motion.  No back pain.  Psych: No change in mood or affect. No depression or anxiety.  No memory loss.   PHYSICAL EXAM: Blood pressure 215/87, pulse 43, temperature 98.8 F (37.1 C), temperature source Oral, resp. rate 17, height 5\' 4"  (1.626 m), weight 65.499 kg (144 lb 6.4 oz), SpO2 98.00%.  General appearance :Awake, alert, not in any distress. Speech Clear. Not toxic Looking HEENT: Atraumatic and Normocephalic, pupils equally reactive to light and accomodation Neck: supple, no JVD. No cervical lymphadenopathy.  Chest:Good air entry bilaterally, no added sounds  CVS: S1 S2 regular, no murmurs.  Abdomen: Bowel sounds present, Non tender and not distended with no gaurding, rigidity or rebound. Extremities:  B/L Lower Ext shows no edema, both legs are warm to touch Neurology: Awake alert, and oriented X 3, CN II-XII intact, Non focal Skin:No Rash Wounds:N/A  LABS ON ADMISSION:   Recent Labs  04/13/13 1203 04/13/13 1713  NA 128* 123*  K 3.1* 2.5*  CL 89* 81*  CO2 32 28  GLUCOSE 636* 601*  BUN 22 22  CREATININE 1.6* 1.44*  CALCIUM 8.7 9.2    Recent Labs  04/13/13 1713  AST 18  ALT 18  ALKPHOS 177*  BILITOT 0.1*  PROT 7.1  ALBUMIN 2.9*   No results found for this basename: LIPASE, AMYLASE,  in the last 72 hours  Recent Labs  04/13/13 1641  WBC 8.7  HGB 11.2*  HCT 31.2*  MCV 83.0  PLT 404*   No results found for this basename: CKTOTAL, CKMB, CKMBINDEX, TROPONINI,  in the last 72 hours No results found for this basename: DDIMER,  in the last 72 hours No components found with  this basename: POCBNP,    RADIOLOGIC STUDIES ON ADMISSION: No results found.   ASSESSMENT AND PLAN: Present on Admission:  . Type 2 diabetes mellitus with hyperosmolar nonketotic hyperglycemia - Will admit to telemetry, placed on insulin infusion.  - Uncontrolled hyperglycemia is secondary to being on significantly low doses of insulin.  - She has chronic kidney disease at baseline, I do not think she should be on metformin as an outpatient as well. While inpatient metformin will be on hold.  - Once her sugars are in range, we will transition her to subcutaneous insulin   . HTN (hypertension), malignant - BP continues to be uncontrolled, we will continue all her usual antihypertensive medications.  - Will use as needed hydralazine if SBP is more than 180 mmHg  - We'll continue to monitor closely and adjust medications as needed   . HYPERLIPIDEMIA - Continue statin   . Hypokalemia - ED physician has already given her 6 runs of IV KCL prior to starting her on a insulin infusion, I will continue KCL 40 mEq 3 times a day, and check the lytes frequently and replace potassium as needed   .  Chronic diastolic CHF (congestive heart failure) - She does have a history of chronic diastolic heart failure, monitor closely while she is on IV fluids. Currently she is clinically compensated, lungs are completely clear on exam and she has no edema in her legs.  . Stage III chronic kidney disease - Monitor electrolytes closely-currently reacting not very far from her usual baseline.  Further plan will depend as patient's clinical course evolves and further radiologic and laboratory data become available. Patient will be monitored closely.   DVT Prophylaxis: Prophylactic Lovenox   Code Status: Full Code   Total time spent for admission equals 45 minutes.  [facility] Pager [phone]  If 7PM-7AM, please contact night-coverage [email or web address] Password [email or web address] 04/13/2013, 9:00 PM

## 2013-04-13 NOTE — Telephone Encounter (Signed)
She is probably ok since the prep is not absorbed - it passeds through and sucks fluid out Her creatinine was up at end of hospitalization earlier this month ( I saw her then)   Have her get a BMET  Today or tomorrow and I will review and decide further  Use dx hyponatremmia and renal insufficiency

## 2013-04-13 NOTE — ED Notes (Signed)
Pt was seen at pcp today for labs pre-colonoscopy.  Found her blood glucose level in 600's and told her to come here.  Presently glucose meter reads HIGH.  Pt denies fatigue, dizziness, nausea or pain.  AO x 4. Recently d/c'd from hospital for tx for hypoglycemia.  Pt has been taking decreased doses of insulin b/c she is afraid to have another hypoglycemic episode.

## 2013-04-13 NOTE — Telephone Encounter (Signed)
Called pt, advised pt Dr. Carlean Purl wanted her to have labs drawn today in basement, lab orders were entered, pt states she will come this afternoon, instructions were given to go to lab in basement and if any problems to have them call up to admitting. Also advised pt that Dr. Carlean Purl thought prep instructions would be fine but just wanted to check labs and he would reevaluate after labs were drawn-adm

## 2013-04-13 NOTE — ED Provider Notes (Signed)
CSN: ID:[id number]     Arrival date & time 04/13/13  1626 History   First MD Initiated Contact with Patient 04/13/13 1703     Chief Complaint  Patient presents with  . Hyperglycemia   (Consider location/radiation/quality/duration/timing/severity/associated sxs/prior Treatment) Patient is a 62 y.o. female presenting with hyperglycemia. The history is provided by the patient. No language interpreter was used.  Hyperglycemia Blood sugar level PTA:  "High" Severity:  Mild Onset quality:  Unable to specify Diabetes status:  Controlled with insulin Current diabetic therapy:  Sliding scale insulin tid Context comment:  Patient with recent hypoglycemic episode, has been reluctant to take full sliding scale dose of insulin Associated symptoms: polyuria   Associated symptoms: no nausea and no vomiting     Past Medical History  Diagnosis Date  . Hypertension   . Diverticulitis   . Hyperlipidemia   . Asthma   . Diabetes mellitus     insulin dependent  . Arthritis     knee  . GI bleed 03/31/2013  . CHF (congestive heart failure)   . Osteoporosis    Past Surgical History  Procedure Laterality Date  . Abdominal hysterectomy    . Esophagogastroduodenoscopy N/A 03/31/2013    Procedure: ESOPHAGOGASTRODUODENOSCOPY (EGD);  Surgeon: Gatha Mayer, MD;  Location: [facility] ENDOSCOPY;  Service: Endoscopy;  Laterality: N/A;   Family History  Problem Relation Age of Onset  . Colon cancer Neg Hx    History  Substance Use Topics  . Smoking status: Former Smoker    Quit date: 05/10/2012  . Smokeless tobacco: Never Used  . Alcohol Use: No   OB History   Grav Para Term Preterm Abortions TAB SAB Ect Mult Living                 Review of Systems  Gastrointestinal: Negative for nausea and vomiting.  Endocrine: Positive for polyuria.  Genitourinary: Positive for frequency.  All other systems reviewed and are negative.    Allergies  Lisinopril  Home Medications   Current Outpatient Rx  Name   Route  Sig  Dispense  Refill  . albuterol (PROVENTIL HFA;VENTOLIN HFA) 108 (90 BASE) MCG/ACT inhaler   Inhalation   Inhale 2 puffs into the lungs every 6 (six) hours as needed for wheezing.         Marland Kitchen acetaminophen (TYLENOL) 500 MG tablet   Oral   Take 500 mg by mouth every 6 (six) hours as needed. For pain/fever         . aspirin 81 MG chewable tablet   Oral   Chew 81 mg by mouth daily.         . Calcium Carbonate-Vit D-Min (CALCIUM 600+D PLUS MINERALS) 600-400 MG-UNIT TABS   Oral   Take 1 tablet by mouth daily.         . carvedilol (COREG) 25 MG tablet   Oral   Take 1 tablet (25 mg total) by mouth 2 (two) times daily with a meal.   60 tablet   3   . Cholecalciferol (D3 SUPER STRENGTH) 2000 UNITS CAPS   Oral   Take 1 capsule by mouth daily.         . cloNIDine (CATAPRES - DOSED IN MG/24 HR) 0.2 mg/24hr patch   Transdermal   Place 1 patch (0.2 mg total) onto the skin once a week. On Thursdays   4 patch   3   . Ferrous Sulfate (IRON) 325 (65 FE) MG TABS   Oral  Take 1 capsule by mouth daily.         . furosemide (LASIX) 40 MG tablet   Oral   Take 1 tablet (40 mg total) by mouth 2 (two) times daily.   60 tablet   3   . hydrALAZINE (APRESOLINE) 100 MG tablet   Oral   Take 1 tablet (100 mg total) by mouth 3 (three) times daily.   90 tablet   3   . insulin NPH-insulin regular (NOVOLIN 70/30) (70-30) 100 UNIT/ML injection   Subcutaneous   Inject 8 Units into the skin 2 (two) times daily with a meal.   10 mL   3   . metFORMIN (GLUCOPHAGE) 500 MG tablet   Oral   Take 1,000 mg by mouth 2 (two) times daily with a meal.         . Na Sulfate-K Sulfate-Mg Sulf (SUPREP BOWEL PREP) SOLN   Oral   Take 1 kit by mouth once. Name brand only, suprep as directed, no substitutions   354 mL   0   . Olmesartan-Amlodipine-HCTZ (TRIBENZOR) 40-5-25 MG TABS   Oral   Take 1 tablet by mouth daily.         . potassium chloride SA (K-DUR,KLOR-CON) 20 MEQ tablet    Oral   Take 20 mEq by mouth daily.         . simvastatin (ZOCOR) 40 MG tablet   Oral   Take 40 mg by mouth every evening.          BP 222/103  Pulse 91  Temp(Src) 98.8 F (37.1 C) (Oral)  Resp 16  Ht 5\' 4"  (1.626 m)  Wt 144 lb 6.4 oz (65.499 kg)  BMI 24.77 kg/m2  SpO2 98% Physical Exam  Nursing note and vitals reviewed. Constitutional: She is oriented to person, place, and time. She appears well-developed and well-nourished.  HENT:  Head: Normocephalic and atraumatic.  Eyes: Conjunctivae are normal. Pupils are equal, round, and reactive to light.  Neck: Normal range of motion.  Cardiovascular: Normal rate and regular rhythm.   Pulmonary/Chest: Effort normal and breath sounds normal.  Abdominal: Soft. Bowel sounds are normal.  Musculoskeletal: Normal range of motion. She exhibits no edema and no tenderness.  Lymphadenopathy:    She has no cervical adenopathy.  Neurological: She is alert and oriented to person, place, and time.  Skin: Skin is warm and dry.  Psychiatric: She has a normal mood and affect. Her behavior is normal. Judgment and thought content normal.    ED Course  Procedures (including critical care time) Labs Review Labs Reviewed  CBC - Abnormal; Notable for the following:    RBC 3.76 (*)    Hemoglobin 11.2 (*)    HCT 31.2 (*)    Platelets 404 (*)    All other components within normal limits  GLUCOSE, CAPILLARY - Abnormal; Notable for the following:    Glucose-Capillary >600 (*)    All other components within normal limits  URINALYSIS, ROUTINE W REFLEX MICROSCOPIC  COMPREHENSIVE METABOLIC PANEL   Imaging Review No results found. Non-toxic appearing patient with hyperglycemic episode--meter reading "high".  Will check labs, IV fluids, insulin coverage. Potassium of 2.5. Replacement started.   Non-ketotic hyperglycemia, anion gap of 26.  Will admit.  Patient discussed with Dr. Leonides Schanz. MDM   Hyperglycemia, non-ketotic. Hypokalemia.    Norman Herrlich, NP 04/13/13 2109

## 2013-04-14 DIAGNOSIS — E119 Type 2 diabetes mellitus without complications: Secondary | ICD-10-CM

## 2013-04-14 DIAGNOSIS — J309 Allergic rhinitis, unspecified: Secondary | ICD-10-CM

## 2013-04-14 DIAGNOSIS — R7309 Other abnormal glucose: Secondary | ICD-10-CM

## 2013-04-14 LAB — BASIC METABOLIC PANEL
BUN: 18 mg/dL (ref 6–23)
BUN: 17 mg/dL (ref 6–23)
CO2: 28 mEq/L (ref 19–32)
CO2: 25 mEq/L (ref 19–32)
Calcium: 8.7 mg/dL (ref 8.4–10.5)
Calcium: 8.5 mg/dL (ref 8.4–10.5)
Chloride: 94 mEq/L — ABNORMAL LOW (ref 96–112)
Chloride: 100 mEq/L (ref 96–112)
Creatinine, Ser: 1.21 mg/dL — ABNORMAL HIGH (ref 0.50–1.10)
Creatinine, Ser: 1.11 mg/dL — ABNORMAL HIGH (ref 0.50–1.10)
GFR calc Af Amer: 54 mL/min — ABNORMAL LOW (ref >90)
GFR calc Af Amer: 60 mL/min — ABNORMAL LOW (ref >90)
GFR calc non Af Amer: 47 mL/min — ABNORMAL LOW (ref >90)
GFR calc non Af Amer: 52 mL/min — ABNORMAL LOW (ref >90)
Glucose, Bld: 277 mg/dL — ABNORMAL HIGH (ref 70–99)
Glucose, Bld: 166 mg/dL — ABNORMAL HIGH (ref 70–99)
Potassium: 2.8 mEq/L — ABNORMAL LOW (ref 3.5–5.1)
Potassium: 3 mEq/L — ABNORMAL LOW (ref 3.5–5.1)
Sodium: 133 mEq/L — ABNORMAL LOW (ref 135–145)
Sodium: 135 mEq/L (ref 135–145)

## 2013-04-14 LAB — COMPREHENSIVE METABOLIC PANEL
ALT: 14 U/L (ref 0–35)
AST: 15 U/L (ref 0–37)
Albumin: 2.6 g/dL — ABNORMAL LOW (ref 3.5–5.2)
Alkaline Phosphatase: 132 U/L — ABNORMAL HIGH (ref 39–117)
BUN: 17 mg/dL (ref 6–23)
CO2: 30 mEq/L (ref 19–32)
Calcium: 8.5 mg/dL (ref 8.4–10.5)
Chloride: 99 mEq/L (ref 96–112)
Creatinine, Ser: 1.2 mg/dL — ABNORMAL HIGH (ref 0.50–1.10)
GFR calc Af Amer: 55 mL/min — ABNORMAL LOW (ref >90)
GFR calc non Af Amer: 47 mL/min — ABNORMAL LOW (ref >90)
Glucose, Bld: 105 mg/dL — ABNORMAL HIGH (ref 70–99)
Potassium: 3 mEq/L — ABNORMAL LOW (ref 3.5–5.1)
Sodium: 136 mEq/L (ref 135–145)
Total Bilirubin: 0.2 mg/dL — ABNORMAL LOW (ref 0.3–1.2)
Total Protein: 6.2 g/dL (ref 6.0–8.3)

## 2013-04-14 LAB — CBC
HCT: 29.5 % — ABNORMAL LOW (ref 36.0–46.0)
HCT: 30.3 % — ABNORMAL LOW (ref 36.0–46.0)
Hemoglobin: 10.3 g/dL — ABNORMAL LOW (ref 12.0–15.0)
Hemoglobin: 10.7 g/dL — ABNORMAL LOW (ref 12.0–15.0)
MCH: 29.2 pg (ref 26.0–34.0)
MCH: 29.3 pg (ref 26.0–34.0)
MCHC: 34.9 g/dL (ref 30.0–36.0)
MCHC: 35.3 g/dL (ref 30.0–36.0)
MCV: 82.6 fL (ref 78.0–100.0)
MCV: 83.8 fL (ref 78.0–100.0)
Platelets: 358 10*3/uL (ref 150–400)
Platelets: 358 10*3/uL (ref 150–400)
RBC: 3.52 MIL/uL — ABNORMAL LOW (ref 3.87–5.11)
RBC: 3.67 MIL/uL — ABNORMAL LOW (ref 3.87–5.11)
RDW: 11.7 % (ref 11.5–15.5)
RDW: 12 % (ref 11.5–15.5)
WBC: 9.2 10*3/uL (ref 4.0–10.5)
WBC: 9.9 10*3/uL (ref 4.0–10.5)

## 2013-04-14 LAB — GLUCOSE, CAPILLARY
Glucose-Capillary: 111 mg/dL — ABNORMAL HIGH (ref 70–99)
Glucose-Capillary: 114 mg/dL — ABNORMAL HIGH (ref 70–99)
Glucose-Capillary: 126 mg/dL — ABNORMAL HIGH (ref 70–99)
Glucose-Capillary: 154 mg/dL — ABNORMAL HIGH (ref 70–99)
Glucose-Capillary: 170 mg/dL — ABNORMAL HIGH (ref 70–99)
Glucose-Capillary: 179 mg/dL — ABNORMAL HIGH (ref 70–99)
Glucose-Capillary: 189 mg/dL — ABNORMAL HIGH (ref 70–99)
Glucose-Capillary: 247 mg/dL — ABNORMAL HIGH (ref 70–99)

## 2013-04-14 LAB — HEMOGLOBIN A1C
Hgb A1c MFr Bld: 15.6 % — ABNORMAL HIGH (ref <5.7)
Mean Plasma Glucose: 401 mg/dL — ABNORMAL HIGH (ref <117)

## 2013-04-14 MED ORDER — INSULIN NPH ISOPHANE & REGULAR (70-30) 100 UNIT/ML ~~LOC~~ SUSP: SUBCUTANEOUS | Status: DC

## 2013-04-14 MED ORDER — LIVING WELL WITH DIABETES BOOK
Freq: Once | Status: AC
  Administered 2013-04-14: 12:00:00
  Filled 2013-04-14: qty 1

## 2013-04-14 MED ORDER — INFLUENZA VAC SPLIT QUAD 0.5 ML IM SUSP: 0.5000 mL | INTRAMUSCULAR | Status: DC

## 2013-04-14 MED ORDER — INSULIN ASPART 100 UNIT/ML ~~LOC~~ SOLN
0.0000 [IU] | SUBCUTANEOUS | Status: DC
  Administered 2013-04-14: 1 [IU] via SUBCUTANEOUS
  Administered 2013-04-14 (×2): 2 [IU] via SUBCUTANEOUS

## 2013-04-14 MED ORDER — SODIUM CHLORIDE 0.9 % IV SOLN
INTRAVENOUS | Status: DC
  Administered 2013-04-14: 06:00:00 via INTRAVENOUS

## 2013-04-14 MED ORDER — INFLUENZA VAC SPLIT QUAD 0.5 ML IM SUSP
0.5000 mL | Freq: Once | INTRAMUSCULAR | Status: AC
  Administered 2013-04-14: 0.5 mL via INTRAMUSCULAR
  Filled 2013-04-14: qty 0.5

## 2013-04-14 MED ORDER — INSULIN GLARGINE 100 UNIT/ML ~~LOC~~ SOLN
10.0000 [IU] | Freq: Every day | SUBCUTANEOUS | Status: DC
  Administered 2013-04-14: 10 [IU] via SUBCUTANEOUS
  Filled 2013-04-14 (×2): qty 0.1

## 2013-04-14 NOTE — Progress Notes (Signed)
Pt has had 3rd reading within target range of 140-180. Per glucostabilizer to set new rate to 0. Paged MD Rogue Bussing for further orders. Will continue to monitor pt.

## 2013-04-14 NOTE — Discharge Summary (Signed)
Physician Discharge Summary  MALENI BETKE [id number] DOB: April 10, 1951 DOA: 04/13/2013  PCP: Salena Saner., MD  Admit date: 04/13/2013 Discharge date: 04/14/2013  Time spent: 45 minutes  Recommendations for Outpatient Follow-up:  1. Continue to monitor BP, compliance vs change in medication 2. Continue to monitor CBG 3. Continue diabetic education/nutrition counseling 4. Patient assistance with medical necessities (glucometer strips, etc)   Discharge Diagnoses:  Principal Problem:   Type 2 diabetes mellitus with hyperosmolar nonketotic hyperglycemia Active Problems:   HYPERLIPIDEMIA   HYPERTENSION   HTN (hypertension), malignant   Hypokalemia   Chronic diastolic CHF (congestive heart failure)   Discharge Condition: Stable  Diet recommendation: Carb modified and heart healthy  Filed Weights   04/13/13 1637 04/13/13 2259  Weight: 65.499 kg (144 lb 6.4 oz) 64.1 kg (141 lb 5 oz)    History of present illness:  LOANA WAFER is a 62 y.o. female with a Past Medical History of diabetes, chronic diastolic heart failure, uncontrolled hypertension who presents today with the above noted complaint. Patient was recently hospitalized for an upper GI bleed, and was doing well until last week, when she claims she had a hypoglycemic episode. She normally takes insulin 70/30 20 units in the morning and 15 units in the evening, but because of the hypoglycemic episodes, she cut down to 2 units in the morning and 2 units in the evening. She is currently seeing Dr. Silvano Rusk of [facility] gastroenterology and is scheduled for a colonoscopy (04/21/13). He had ordered some routine labs before the procedure, which showed a blood glucose of more than 600, she was then asked to go to the emergency room for further evaluation and treatment. I was then subsequently asked to admit this patient.  During my evaluation, patient was very comfortable, she was awake and alert and not in any distress.  She did confirm some symptoms of polyuria and polydipsia. She is now being admitted to the hospitalist service for further evaluation and treatment.   Hospital Course:   DM2 with Hyperosmolar Nonketotic Hyperglycemia: - Secondary to self-reduced SQ insulin injections without monitoring (reports no monitoring strips due to financial constraints) - denies polydipsia and polyuria currently - IV insulin drip,  transitioned back to SQ insulin - tolerated well and feels "good"  - SQ insulin at home will be 8 units am and pm, but she was taking only 1-2 units of the 70/30 insulin. - Continue to monitor CBG - Continue patient diabetic education - Asked the patient to take 15 units in the AM and 10 units with dinner.  Chronic Diastolic Heart Failure: - Stable and compensated - Lungs clear to auscultation, no peripheral edema - Continue current Tx (lasix)   Uncontrolled HTN: - No Sx currently (H/A, blurred vision) - Continue preadmission home medications.  Procedures:  None  Consultations:  Diabetic Coordinator  Nutrition Consult  Discharge Exam: Filed Vitals:   04/14/13 0605  BP: 177/78  Pulse: 68  Temp: 98.6 F (37 C)  Resp: 18   General: No apparent distress.  Resting comfortably in bed Cardiovascular: RRR, no murmur, gallop, rub Respiratory: Clear to auscultation bilaterally.  No rhonchi, rales, or wheezes. Abdomen: Soft, non-tender.  Bowel sounds heard in all 4 quadranst. Musculoskeletal: Full ROM, 5/5 strength all extremities.  No peripheral edema.  Distal pulses intact and symmetrical. Psychiatric: Good affect, smiling, compliant, responds appropriately, well groomed  Discharge Instructions   Future Appointments Provider Department Dept Phone   04/21/2013 2:00 PM Gatha Mayer, MD [place]  [place] [phone]       Medication List    ASK your doctor about these medications       albuterol 108 (90 BASE) MCG/ACT inhaler  Commonly known as:   PROVENTIL HFA;VENTOLIN HFA  Inhale 2 puffs into the lungs every 6 (six) hours as needed for wheezing.     aspirin 81 MG chewable tablet  Chew 81 mg by mouth daily.     CALCIUM 600+D PLUS MINERALS 600-400 MG-UNIT Tabs  Take 1 tablet by mouth daily.     carvedilol 25 MG tablet  Commonly known as:  COREG  Take 1 tablet (25 mg total) by mouth 2 (two) times daily with a meal.     cloNIDine 0.2 mg/24hr patch  Commonly known as:  CATAPRES - Dosed in mg/24 hr  Place 1 patch (0.2 mg total) onto the skin once a week. On Thursdays     D3 SUPER STRENGTH 2000 UNITS Caps  Generic drug:  Cholecalciferol  Take 1 capsule by mouth daily.     furosemide 40 MG tablet  Commonly known as:  LASIX  Take 1 tablet (40 mg total) by mouth 2 (two) times daily.     insulin NPH-regular (70-30) 100 UNIT/ML injection  Commonly known as:  NOVOLIN 70/30  Inject 8 Units into the skin 2 (two) times daily with a meal.     Iron 325 (65 FE) MG Tabs  Take 1 capsule by mouth daily.     metFORMIN 500 MG tablet  Commonly known as:  GLUCOPHAGE  Take 1,000 mg by mouth 2 (two) times daily with a meal.     Na Sulfate-K Sulfate-Mg Sulf Soln  Commonly known as:  SUPREP BOWEL PREP  Take 1 kit by mouth once. Name brand only, suprep as directed, no substitutions     potassium chloride SA 20 MEQ tablet  Commonly known as:  K-DUR,KLOR-CON  Take 20 mEq by mouth daily.     simvastatin 40 MG tablet  Commonly known as:  ZOCOR  Take 40 mg by mouth every evening.     TRIBENZOR 40-5-25 MG Tabs  Generic drug:  Olmesartan-Amlodipine-HCTZ  Take 1 tablet by mouth daily.       Allergies  Allergen Reactions  . Lisinopril Cough       Follow-up Information   Follow up with Salena Saner., MD. Schedule an appointment as soon as possible for a visit in 2 weeks.   Specialty:  Internal Medicine   Contact information:   [address] [place] [address] [phone]        The results of significant  diagnostics from this hospitalization (including imaging, microbiology, ancillary and laboratory) are listed below for reference.    Significant Diagnostic Studies: Ct Head Wo Contrast  03/31/2013   *RADIOLOGY REPORT*  Clinical Data: Dizziness, vomiting  CT HEAD WITHOUT CONTRAST  Technique:  Contiguous axial images were obtained from the base of the skull through the vertex without contrast.  Comparison: None.  Findings: There is no acute intracranial hemorrhage or infarct.  No mass lesion or midline shift.  Subtle calcification is noted within the basal ganglia bilaterally.  Gray-white matter differentiation is maintained.  No midline shift or mass lesion.  No extra-axial fluid collection.  Calvarium is intact.  Orbital soft tissues are normal.  There is scattered mucosal thickening within the ethmoidal air cells bilaterally.  Paranasal sinuses are otherwise clear.  Mastoid air cells well pneumatized.  IMPRESSION: No acute intracranial process.  Original Report Authenticated By: Jeannine Boga, M.D.   Dg Chest Portable 1 View  03/31/2013   *RADIOLOGY REPORT*  Clinical Data: Vomiting.  PORTABLE CHEST - 1 VIEW  Comparison: 08/25/2012.  Findings: Mild cardiomegaly is unchanged from prior, as are upper mediastinal contours.  Aortic arch atherosclerosis.  No infiltrate, edema, effusion, pneumothorax.  Glenohumeral and acromioclavicular osteoarthritis, right more than left.  IMPRESSION: No evidence of acute cardiopulmonary disease.   Original Report Authenticated By: Jorje Guild    Microbiology: No results found for this or any previous visit (from the past 240 hour(s)).   Labs: Basic Metabolic Panel:  Recent Labs Lab 04/13/13 1203 04/13/13 1713 04/13/13 2355 04/14/13 0245 04/14/13 0635  NA 128* 123* 133* 136 135  K 3.1* 2.5* 2.8* 3.0* 3.0*  CL 89* 81* 94* 99 100  CO2 32 28 28 30 25   GLUCOSE 636* 601* 277* 105* 166*  BUN 22 22 18 17 17   CREATININE 1.6* 1.44* 1.21* 1.20* 1.11*  CALCIUM  8.7 9.2 8.7 8.5 8.5   Liver Function Tests:  Recent Labs Lab 04/13/13 1713 04/14/13 0245  AST 18 15  ALT 18 14  ALKPHOS 177* 132*  BILITOT 0.1* 0.2*  PROT 7.1 6.2  ALBUMIN 2.9* 2.6*   CBC:  Recent Labs Lab 04/13/13 1641 04/13/13 2355 04/14/13 0245  WBC 8.7 9.9 9.2  HGB 11.2* 10.7* 10.3*  HCT 31.2* 30.3* 29.5*  MCV 83.0 82.6 83.8  PLT 404* 358 358   BNP: BNP (last 3 results)  Recent Labs  08/23/12 2335  PROBNP 7978.0*   CBG:  Recent Labs Lab 04/14/13 0214 04/14/13 0315 04/14/13 0423 04/14/13 0527 04/14/13 0823  GLUCAP 114* 111* 154* 170* 126*     Signed:  Fenton Malling PA-S  [facility] 04/14/2013, 11:32 AM    Addendum  Patient seen and examined, chart and data base reviewed.  I agree with the above assessment and plan.  For full details please see Mrs. Fenton Malling PA-S note.  Addition and revision was done by my to the above note   Birdie Hopes, MD [facility] Pager: [phone] 04/14/2013, 12:27 PM

## 2013-04-14 NOTE — Plan of Care (Signed)
Problem: Limited Adherence to Nutrition-Related Recommendations (NB-1.6) Goal: Nutrition education Formal process to instruct or train a patient/client in a skill or to impart knowledge to help patients/clients voluntarily manage or modify food choices and eating behavior to maintain or improve health. Outcome: Completed/Met Date Met:  04/14/13  RD consulted for nutrition education regarding diabetes.     Lab Results  Component Value Date    HGBA1C 9.3* 08/25/2012    RD provided "Carbohydrate Counting for People with Diabetes" handout from the Academy of Nutrition and Dietetics. Discussed different food groups and their effects on blood sugar, emphasizing carbohydrate-containing foods. Provided list of carbohydrates and recommended serving sizes of common foods.  Discussed importance of controlled and consistent carbohydrate intake throughout the day. Provided examples of ways to balance meals/snacks and encouraged intake of high-fiber, whole grain complex carbohydrates. Teach back method used.  Expect fair compliance.  Body mass index is 24.24 kg/(m^2). Pt meets criteria for WNL based on current BMI.  Current diet order is Carb Mod, patient is consuming approximately 85% of meals at this time. Labs and medications reviewed. No further nutrition interventions warranted at this time. RD contact information provided. If additional nutrition issues arise, please re-consult RD.  Pricilla Holm RD, LDN Pager [phone] After Hours pager [phone]

## 2013-04-14 NOTE — Progress Notes (Signed)
Received a consult for diabetes Coordinator:  Spoke with patient about her diabetes.  States that she was diagnosed in 85.  Sees Dr. Willey Blade as her PCP.  Was last in hospital on 03/31/13 with low blood sugar and was afraid to take the dosage of insulin that she had been on prior to that time.  States that she had been taking 70/30 insulin 20 units in the AM and 15 units in the PM.  Was only taking 1-2 units at home because she was by herself and was afraid she would go low and pass out.  Was not checking her blood sugars since she could not afford the strips.  States that she is on a fixed income. States she will make appointment with Dr. Karlton Lemon next month when she can afford to pay.  Suggested that she get a [employer] meter and strips for the price.  Dietician consult has been ordered since patient has questions about her meal planning. Living Well with Diabetes booklet ordered.  Recommend patient taking about half of dosage of 70/30 insulin that she was on which would be equal to  8 or 10 units BID.  Stressed to the patient that she needs to check her blood sugars and take her insulin.  If in doubt, call her doctor. Will continue to follow while in hospital.  Harvel Ricks RN BSN CDE

## 2013-04-14 NOTE — Progress Notes (Signed)
Discharge home.  Patient educated on discharge instructions.  AVF signed.  Flu vaccination administered.  Belongings gathered.  IV removed.  Escorted off unit via wheelchair with volunteer services.

## 2013-04-18 ENCOUNTER — Other Ambulatory Visit: Payer: Self-pay

## 2013-04-18 DIAGNOSIS — E871 Hypo-osmolality and hyponatremia: Secondary | ICD-10-CM

## 2013-04-18 NOTE — Progress Notes (Signed)
Quick Note:    Noted    ______

## 2013-04-21 ENCOUNTER — Encounter: Payer: BC Managed Care – PPO | Admitting: Internal Medicine

## 2013-05-23 ENCOUNTER — Emergency Department (HOSPITAL_COMMUNITY): Payer: BC Managed Care – PPO

## 2013-05-23 ENCOUNTER — Encounter (HOSPITAL_COMMUNITY): Payer: Self-pay | Admitting: Emergency Medicine

## 2013-05-23 ENCOUNTER — Inpatient Hospital Stay (HOSPITAL_COMMUNITY)
Admission: EM | Admit: 2013-05-23 | Discharge: 2013-06-01 | DRG: 291 | Disposition: A | Discharge status: Home / Self Care | Payer: BC Managed Care – PPO | Attending: Internal Medicine | Admitting: Internal Medicine

## 2013-05-23 DIAGNOSIS — Z91199 Patient's noncompliance with other medical treatment and regimen due to unspecified reason: Secondary | ICD-10-CM

## 2013-05-23 DIAGNOSIS — Z7982 Long term (current) use of aspirin: Secondary | ICD-10-CM

## 2013-05-23 DIAGNOSIS — E1100 Type 2 diabetes mellitus with hyperosmolarity without nonketotic hyperglycemic-hyperosmolar coma (NKHHC): Secondary | ICD-10-CM

## 2013-05-23 DIAGNOSIS — N183 Chronic kidney disease, stage 3 unspecified: Secondary | ICD-10-CM | POA: Diagnosis present

## 2013-05-23 DIAGNOSIS — M1710 Unilateral primary osteoarthritis, unspecified knee: Secondary | ICD-10-CM | POA: Diagnosis present

## 2013-05-23 DIAGNOSIS — E1122 Type 2 diabetes mellitus with diabetic chronic kidney disease: Secondary | ICD-10-CM | POA: Diagnosis present

## 2013-05-23 DIAGNOSIS — Z79899 Other long term (current) drug therapy: Secondary | ICD-10-CM

## 2013-05-23 DIAGNOSIS — J9600 Acute respiratory failure, unspecified whether with hypoxia or hypercapnia: Secondary | ICD-10-CM | POA: Diagnosis present

## 2013-05-23 DIAGNOSIS — I119 Hypertensive heart disease without heart failure: Secondary | ICD-10-CM

## 2013-05-23 DIAGNOSIS — J811 Chronic pulmonary edema: Secondary | ICD-10-CM | POA: Diagnosis present

## 2013-05-23 DIAGNOSIS — Z9119 Patient's noncompliance with other medical treatment and regimen: Secondary | ICD-10-CM

## 2013-05-23 DIAGNOSIS — E1121 Type 2 diabetes mellitus with diabetic nephropathy: Secondary | ICD-10-CM

## 2013-05-23 DIAGNOSIS — E785 Hyperlipidemia, unspecified: Secondary | ICD-10-CM | POA: Diagnosis present

## 2013-05-23 DIAGNOSIS — E1129 Type 2 diabetes mellitus with other diabetic kidney complication: Secondary | ICD-10-CM | POA: Diagnosis present

## 2013-05-23 DIAGNOSIS — Z87891 Personal history of nicotine dependence: Secondary | ICD-10-CM

## 2013-05-23 DIAGNOSIS — M810 Age-related osteoporosis without current pathological fracture: Secondary | ICD-10-CM | POA: Diagnosis present

## 2013-05-23 DIAGNOSIS — E1169 Type 2 diabetes mellitus with other specified complication: Secondary | ICD-10-CM | POA: Diagnosis present

## 2013-05-23 DIAGNOSIS — E1101 Type 2 diabetes mellitus with hyperosmolarity with coma: Secondary | ICD-10-CM

## 2013-05-23 DIAGNOSIS — Z794 Long term (current) use of insulin: Secondary | ICD-10-CM

## 2013-05-23 DIAGNOSIS — J45909 Unspecified asthma, uncomplicated: Secondary | ICD-10-CM | POA: Diagnosis present

## 2013-05-23 DIAGNOSIS — I509 Heart failure, unspecified: Secondary | ICD-10-CM

## 2013-05-23 DIAGNOSIS — I5032 Chronic diastolic (congestive) heart failure: Secondary | ICD-10-CM

## 2013-05-23 DIAGNOSIS — E119 Type 2 diabetes mellitus without complications: Secondary | ICD-10-CM | POA: Diagnosis present

## 2013-05-23 DIAGNOSIS — IMO0002 Reserved for concepts with insufficient information to code with codable children: Secondary | ICD-10-CM

## 2013-05-23 DIAGNOSIS — I10 Essential (primary) hypertension: Secondary | ICD-10-CM | POA: Diagnosis present

## 2013-05-23 DIAGNOSIS — E876 Hypokalemia: Secondary | ICD-10-CM

## 2013-05-23 DIAGNOSIS — N289 Disorder of kidney and ureter, unspecified: Secondary | ICD-10-CM

## 2013-05-23 DIAGNOSIS — I130 Hypertensive heart and chronic kidney disease with heart failure and stage 1 through stage 4 chronic kidney disease, or unspecified chronic kidney disease: Principal | ICD-10-CM | POA: Diagnosis present

## 2013-05-23 DIAGNOSIS — I5033 Acute on chronic diastolic (congestive) heart failure: Secondary | ICD-10-CM | POA: Diagnosis present

## 2013-05-23 DIAGNOSIS — I160 Hypertensive urgency: Secondary | ICD-10-CM | POA: Insufficient documentation

## 2013-05-23 DIAGNOSIS — I161 Hypertensive emergency: Secondary | ICD-10-CM

## 2013-05-23 DIAGNOSIS — R739 Hyperglycemia, unspecified: Secondary | ICD-10-CM

## 2013-05-23 DIAGNOSIS — Z66 Do not resuscitate: Secondary | ICD-10-CM | POA: Diagnosis present

## 2013-05-23 DIAGNOSIS — N058 Unspecified nephritic syndrome with other morphologic changes: Secondary | ICD-10-CM | POA: Diagnosis present

## 2013-05-23 LAB — CBC
HCT: 28.6 % — ABNORMAL LOW (ref 36.0–46.0)
Hemoglobin: 9.7 g/dL — ABNORMAL LOW (ref 12.0–15.0)
MCH: 29.3 pg (ref 26.0–34.0)
MCHC: 33.9 g/dL (ref 30.0–36.0)
MCV: 86.4 fL (ref 78.0–100.0)
Platelets: 349 10*3/uL (ref 150–400)
RBC: 3.31 MIL/uL — ABNORMAL LOW (ref 3.87–5.11)
RDW: 12.7 % (ref 11.5–15.5)
WBC: 11.6 10*3/uL — ABNORMAL HIGH (ref 4.0–10.5)

## 2013-05-23 LAB — BASIC METABOLIC PANEL
BUN: 18 mg/dL (ref 6–23)
CO2: 21 mEq/L (ref 19–32)
Calcium: 8.6 mg/dL (ref 8.4–10.5)
Chloride: 97 mEq/L (ref 96–112)
Creatinine, Ser: 1.56 mg/dL — ABNORMAL HIGH (ref 0.50–1.10)
GFR calc Af Amer: 40 mL/min — ABNORMAL LOW (ref >90)
GFR calc non Af Amer: 35 mL/min — ABNORMAL LOW (ref >90)
Glucose, Bld: 350 mg/dL — ABNORMAL HIGH (ref 70–99)
Potassium: 3.5 mEq/L (ref 3.5–5.1)
Sodium: 135 mEq/L (ref 135–145)

## 2013-05-23 LAB — URINALYSIS, ROUTINE W REFLEX MICROSCOPIC
Bilirubin Urine: NEGATIVE
Glucose, UA: 500 mg/dL — AB
Ketones, ur: NEGATIVE mg/dL
Leukocytes, UA: NEGATIVE
Nitrite: NEGATIVE
Protein, ur: >300 mg/dL — AB
Specific Gravity, Urine: 1.013 (ref 1.005–1.030)
Urobilinogen, UA: 0.2 mg/dL (ref 0.0–1.0)
pH: 6.5 (ref 5.0–8.0)

## 2013-05-23 LAB — GLUCOSE, CAPILLARY
Glucose-Capillary: 318 mg/dL — ABNORMAL HIGH (ref 70–99)
Glucose-Capillary: 60 mg/dL — ABNORMAL LOW (ref 70–99)
Glucose-Capillary: 69 mg/dL — ABNORMAL LOW (ref 70–99)
Glucose-Capillary: 95 mg/dL (ref 70–99)

## 2013-05-23 LAB — CBC WITH DIFFERENTIAL/PLATELET
Basophils Absolute: 0 10*3/uL (ref 0.0–0.1)
Basophils Relative: 0 % (ref 0–1)
Eosinophils Absolute: 0.3 10*3/uL (ref 0.0–0.7)
Eosinophils Relative: 2 % (ref 0–5)
HCT: 32.7 % — ABNORMAL LOW (ref 36.0–46.0)
Hemoglobin: 10.7 g/dL — ABNORMAL LOW (ref 12.0–15.0)
Lymphocytes Relative: 45 % (ref 12–46)
Lymphs Abs: 6.4 10*3/uL — ABNORMAL HIGH (ref 0.7–4.0)
MCH: 29.1 pg (ref 26.0–34.0)
MCHC: 32.7 g/dL (ref 30.0–36.0)
MCV: 88.9 fL (ref 78.0–100.0)
Monocytes Absolute: 1 10*3/uL (ref 0.1–1.0)
Monocytes Relative: 7 % (ref 3–12)
Neutro Abs: 6.5 10*3/uL (ref 1.7–7.7)
Neutrophils Relative %: 46 % (ref 43–77)
Platelets: 425 10*3/uL — ABNORMAL HIGH (ref 150–400)
RBC: 3.68 MIL/uL — ABNORMAL LOW (ref 3.87–5.11)
RDW: 12.8 % (ref 11.5–15.5)
WBC: 14.2 10*3/uL — ABNORMAL HIGH (ref 4.0–10.5)

## 2013-05-23 LAB — URINE MICROSCOPIC-ADD ON

## 2013-05-23 LAB — HEMOGLOBIN A1C
Hgb A1c MFr Bld: 12.2 % — ABNORMAL HIGH (ref <5.7)
Mean Plasma Glucose: 303 mg/dL — ABNORMAL HIGH (ref <117)

## 2013-05-23 LAB — CREATININE, SERUM
Creatinine, Ser: 1.62 mg/dL — ABNORMAL HIGH (ref 0.50–1.10)
GFR calc Af Amer: 38 mL/min — ABNORMAL LOW (ref >90)
GFR calc non Af Amer: 33 mL/min — ABNORMAL LOW (ref >90)

## 2013-05-23 LAB — PRO B NATRIURETIC PEPTIDE: Pro B Natriuretic peptide (BNP): 12897 pg/mL — ABNORMAL HIGH (ref 0–125)

## 2013-05-23 LAB — TROPONIN I
Troponin I: <0.3 ng/mL (ref <0.30)
Troponin I: <0.3 ng/mL (ref <0.30)
Troponin I: <0.3 ng/mL (ref <0.30)
Troponin I: <0.3 ng/mL (ref <0.30)

## 2013-05-23 LAB — PROTIME-INR
INR: 0.95 (ref 0.00–1.49)
Prothrombin Time: 12.5 seconds (ref 11.6–15.2)

## 2013-05-23 LAB — MRSA PCR SCREENING: MRSA by PCR: NEGATIVE

## 2013-05-23 LAB — TSH: TSH: 2.307 u[IU]/mL (ref 0.350–4.500)

## 2013-05-23 MED ORDER — CLONIDINE HCL 0.1 MG PO TABS
0.1000 mg | ORAL_TABLET | Freq: Three times a day (TID) | ORAL | Status: DC
  Administered 2013-05-23 – 2013-05-24 (×6): 0.1 mg via ORAL
  Filled 2013-05-23 (×9): qty 1

## 2013-05-23 MED ORDER — ONDANSETRON HCL 4 MG PO TABS
4.0000 mg | ORAL_TABLET | Freq: Four times a day (QID) | ORAL | Status: DC | PRN
  Administered 2013-05-26: 4 mg via ORAL
  Filled 2013-05-23: qty 1

## 2013-05-23 MED ORDER — HEPARIN SODIUM (PORCINE) 5000 UNIT/ML IJ SOLN
5000.0000 [IU] | Freq: Three times a day (TID) | INTRAMUSCULAR | Status: DC
  Administered 2013-05-23 – 2013-06-01 (×26): 5000 [IU] via SUBCUTANEOUS
  Filled 2013-05-23 (×30): qty 1

## 2013-05-23 MED ORDER — NITROGLYCERIN 0.4 MG SL SUBL
0.4000 mg | SUBLINGUAL_TABLET | SUBLINGUAL | Status: DC | PRN
  Administered 2013-05-23: 0.4 mg via SUBLINGUAL

## 2013-05-23 MED ORDER — SODIUM CHLORIDE 0.9 % IJ SOLN
3.0000 mL | Freq: Two times a day (BID) | INTRAMUSCULAR | Status: DC
  Administered 2013-05-23 – 2013-05-29 (×13): 3 mL via INTRAVENOUS

## 2013-05-23 MED ORDER — AMLODIPINE BESYLATE 5 MG PO TABS: 5.0000 mg | ORAL_TABLET | Freq: Every day | ORAL | Status: DC

## 2013-05-23 MED ORDER — FUROSEMIDE 10 MG/ML IJ SOLN
40.0000 mg | Freq: Once | INTRAMUSCULAR | Status: AC
  Administered 2013-05-23: 40 mg via INTRAVENOUS
  Filled 2013-05-23: qty 4

## 2013-05-23 MED ORDER — CARVEDILOL 12.5 MG PO TABS
12.5000 mg | ORAL_TABLET | Freq: Two times a day (BID) | ORAL | Status: DC
  Filled 2013-05-23 (×3): qty 1

## 2013-05-23 MED ORDER — FUROSEMIDE 10 MG/ML IJ SOLN
20.0000 mg | Freq: Once | INTRAMUSCULAR | Status: AC
  Administered 2013-05-23: 20 mg via INTRAVENOUS
  Filled 2013-05-23: qty 2

## 2013-05-23 MED ORDER — INSULIN ASPART PROT & ASPART (70-30 MIX) 100 UNIT/ML ~~LOC~~ SUSP
25.0000 [IU] | Freq: Two times a day (BID) | SUBCUTANEOUS | Status: DC
  Administered 2013-05-23: 25 [IU] via SUBCUTANEOUS
  Administered 2013-05-24: 15 [IU] via SUBCUTANEOUS
  Filled 2013-05-23 (×3): qty 10

## 2013-05-23 MED ORDER — ASPIRIN 81 MG PO CHEW
81.0000 mg | CHEWABLE_TABLET | Freq: Every day | ORAL | Status: DC
  Administered 2013-05-23 – 2013-06-01 (×10): 81 mg via ORAL
  Filled 2013-05-23 (×10): qty 1

## 2013-05-23 MED ORDER — VALSARTAN 160 MG PO TABS
320.0000 mg | ORAL_TABLET | Freq: Every day | ORAL | Status: DC
  Administered 2013-05-23 – 2013-06-01 (×10): 320 mg via ORAL
  Filled 2013-05-23 (×11): qty 2

## 2013-05-23 MED ORDER — FUROSEMIDE 10 MG/ML IJ SOLN: 40.0000 mg | Freq: Two times a day (BID) | INTRAMUSCULAR | Status: DC

## 2013-05-23 MED ORDER — ALBUTEROL SULFATE (5 MG/ML) 0.5% IN NEBU: 2.5000 mg | INHALATION_SOLUTION | RESPIRATORY_TRACT | Status: DC | PRN

## 2013-05-23 MED ORDER — ATORVASTATIN CALCIUM 20 MG PO TABS
20.0000 mg | ORAL_TABLET | Freq: Every day | ORAL | Status: DC
  Administered 2013-05-23 – 2013-05-31 (×9): 20 mg via ORAL
  Filled 2013-05-23 (×10): qty 1

## 2013-05-23 MED ORDER — ACETAMINOPHEN 325 MG PO TABS: 650.0000 mg | ORAL_TABLET | Freq: Four times a day (QID) | ORAL | Status: DC | PRN

## 2013-05-23 MED ORDER — CARVEDILOL 25 MG PO TABS
25.0000 mg | ORAL_TABLET | Freq: Two times a day (BID) | ORAL | Status: DC
  Administered 2013-05-23 – 2013-06-01 (×18): 25 mg via ORAL
  Filled 2013-05-23 (×20): qty 1

## 2013-05-23 MED ORDER — LABETALOL HCL 5 MG/ML IV SOLN
20.0000 mg | Freq: Once | INTRAVENOUS | Status: AC
  Administered 2013-05-23: 20 mg via INTRAVENOUS
  Filled 2013-05-23: qty 4

## 2013-05-23 MED ORDER — HYDRALAZINE HCL 20 MG/ML IJ SOLN
10.0000 mg | Freq: Four times a day (QID) | INTRAMUSCULAR | Status: DC | PRN
  Administered 2013-05-27: 10 mg via INTRAVENOUS
  Filled 2013-05-23 (×2): qty 1

## 2013-05-23 MED ORDER — FERROUS SULFATE 325 (65 FE) MG PO TABS
325.0000 mg | ORAL_TABLET | Freq: Every day | ORAL | Status: DC
  Administered 2013-05-24 – 2013-06-01 (×9): 325 mg via ORAL
  Filled 2013-05-23 (×10): qty 1

## 2013-05-23 MED ORDER — AMLODIPINE BESYLATE-VALSARTAN 10-320 MG PO TABS: 1.0000 | ORAL_TABLET | Freq: Every day | ORAL | Status: DC

## 2013-05-23 MED ORDER — IRON 325 (65 FE) MG PO TABS: 1.0000 | ORAL_TABLET | Freq: Every day | ORAL | Status: DC

## 2013-05-23 MED ORDER — LORAZEPAM 0.5 MG PO TABS
0.5000 mg | ORAL_TABLET | Freq: Once | ORAL | Status: AC
  Administered 2013-05-23: 0.5 mg via ORAL
  Filled 2013-05-23: qty 1

## 2013-05-23 MED ORDER — MAGNESIUM SULFATE 40 MG/ML IJ SOLN
1.0000 g | Freq: Once | INTRAMUSCULAR | Status: AC
  Administered 2013-05-23: 1 g via INTRAVENOUS
  Filled 2013-05-23: qty 50

## 2013-05-23 MED ORDER — NITROGLYCERIN IN D5W 200-5 MCG/ML-% IV SOLN
5.0000 ug/min | INTRAVENOUS | Status: DC
  Administered 2013-05-23: 10 ug/min via INTRAVENOUS
  Filled 2013-05-23: qty 250

## 2013-05-23 MED ORDER — ONDANSETRON HCL 4 MG/2ML IJ SOLN
4.0000 mg | Freq: Once | INTRAMUSCULAR | Status: AC
  Administered 2013-05-23: 4 mg via INTRAVENOUS
  Filled 2013-05-23: qty 2

## 2013-05-23 MED ORDER — POLYETHYLENE GLYCOL 3350 17 G PO PACK
17.0000 g | PACK | Freq: Every day | ORAL | Status: DC | PRN
  Administered 2013-05-24: 17 g via ORAL
  Filled 2013-05-23: qty 1

## 2013-05-23 MED ORDER — ACETAMINOPHEN 650 MG RE SUPP: 650.0000 mg | Freq: Four times a day (QID) | RECTAL | Status: DC | PRN

## 2013-05-23 MED ORDER — ISOSORBIDE MONONITRATE ER 30 MG PO TB24
30.0000 mg | ORAL_TABLET | Freq: Every day | ORAL | Status: DC
  Administered 2013-05-24: 30 mg via ORAL
  Filled 2013-05-23 (×2): qty 1

## 2013-05-23 MED ORDER — GUAIFENESIN-DM 100-10 MG/5ML PO SYRP
5.0000 mL | ORAL_SOLUTION | ORAL | Status: DC | PRN
  Administered 2013-05-24 – 2013-05-30 (×3): 5 mL via ORAL
  Filled 2013-05-23 (×4): qty 5

## 2013-05-23 MED ORDER — HYDROCODONE-ACETAMINOPHEN 5-325 MG PO TABS
1.0000 | ORAL_TABLET | ORAL | Status: DC | PRN
  Administered 2013-05-25 – 2013-05-28 (×3): 1 via ORAL
  Filled 2013-05-23: qty 1
  Filled 2013-05-23: qty 2
  Filled 2013-05-23: qty 1

## 2013-05-23 MED ORDER — ONDANSETRON HCL 4 MG/2ML IJ SOLN: 4.0000 mg | Freq: Four times a day (QID) | INTRAMUSCULAR | Status: DC | PRN

## 2013-05-23 MED ORDER — FUROSEMIDE 10 MG/ML IJ SOLN
40.0000 mg | Freq: Two times a day (BID) | INTRAMUSCULAR | Status: DC
  Administered 2013-05-24 – 2013-05-25 (×3): 40 mg via INTRAVENOUS
  Filled 2013-05-23 (×5): qty 4

## 2013-05-23 MED ORDER — AMLODIPINE BESYLATE 10 MG PO TABS
10.0000 mg | ORAL_TABLET | Freq: Every day | ORAL | Status: DC
  Administered 2013-05-23 – 2013-05-27 (×5): 10 mg via ORAL
  Filled 2013-05-23 (×6): qty 1

## 2013-05-23 MED ORDER — POTASSIUM CHLORIDE CRYS ER 20 MEQ PO TBCR
40.0000 meq | EXTENDED_RELEASE_TABLET | Freq: Three times a day (TID) | ORAL | Status: AC
  Administered 2013-05-23 (×2): 40 meq via ORAL
  Filled 2013-05-23 (×2): qty 2

## 2013-05-23 MED ORDER — INSULIN ASPART 100 UNIT/ML ~~LOC~~ SOLN
0.0000 [IU] | Freq: Three times a day (TID) | SUBCUTANEOUS | Status: DC
  Administered 2013-05-23: 7 [IU] via SUBCUTANEOUS
  Administered 2013-05-24 – 2013-05-25 (×2): 1 [IU] via SUBCUTANEOUS
  Administered 2013-05-25: 3 [IU] via SUBCUTANEOUS
  Administered 2013-05-26: 1 [IU] via SUBCUTANEOUS
  Administered 2013-05-26: 3 [IU] via SUBCUTANEOUS
  Administered 2013-05-27 (×3): 1 [IU] via SUBCUTANEOUS

## 2013-05-23 MED ORDER — SIMVASTATIN 40 MG PO TABS: 40.0000 mg | ORAL_TABLET | Freq: Every evening | ORAL | Status: DC

## 2013-05-23 MED ORDER — FUROSEMIDE 10 MG/ML IJ SOLN
40.0000 mg | Freq: Three times a day (TID) | INTRAMUSCULAR | Status: AC
  Administered 2013-05-23 (×2): 40 mg via INTRAVENOUS
  Filled 2013-05-23 (×2): qty 4

## 2013-05-23 NOTE — H&P (Signed)
[facility]                                                                                    Patient Demographics  Destiny Day, is a 62 y.o. female  MRN: [id number]   DOB - 07-Jun-1951  Admit Date - 05/23/2013  Outpatient Primary MD for the patient is Salena Saner., MD   With History of -  Past Medical History  Diagnosis Date  . Hypertension   . Diverticulitis   . Hyperlipidemia   . Asthma   . Diabetes mellitus     insulin dependent  . Arthritis     knee  . GI bleed 03/31/2013  . CHF (congestive heart failure)   . Osteoporosis       Past Surgical History  Procedure Laterality Date  . Abdominal hysterectomy    . Esophagogastroduodenoscopy N/A 03/31/2013    Procedure: ESOPHAGOGASTRODUODENOSCOPY (EGD);  Surgeon: Gatha Mayer, MD;  Location: [facility] ENDOSCOPY;  Service: Endoscopy;  Laterality: N/A;    in for   Chief Complaint  Patient presents with  . Congestive Heart Failure     HPI  Destiny Day  is a 62 y.o. female, with history of hypertension ROM a type 2 diabetes mellitus, poor medication compliance, chronic diastolic CHF, asthma, dyslipidemia, arthritis presents to the hospital with chief complaints of shortness of breath, in the ER she was found to be in hypertensive crisis with blood pressure 220/115, per husband patient is not compliant with her medications, also her glucose was in excess of 300, chest x-ray was suggestive of pulmonary edema, she was given nitroglycerin IV drip, Lasix along with CPAP with good results, pulmonary critical care was consulted who suggested hospitalist admission.    By the time of my interview patient is feeling much better she is off of CPAP, blood pressures have improved, she is being titrated down on nitro drip, denies any headache, no chest pain or palpitations, no cough fever, no abdominal pain, no blood in stool or urine. No focal weakness. No problems with vision or hearing.     Review of Systems    In  addition to the HPI above,   No Fever-chills, No Headache, No changes with Vision or hearing, No problems swallowing food or Liquids, No Chest pain, Cough , ++ Shortness of Breath mild orthopnea, No Abdominal pain, No Nausea or Vommitting, Bowel movements are regular, No Blood in stool or Urine, No dysuria, No new skin rashes or bruises, No new joints pains-aches,  No new weakness, tingling, numbness in any extremity, No recent weight gain or loss, No polyuria, polydypsia or polyphagia, No significant Mental Stressors.  A full 10 point Review of Systems was done, except as stated above, all other Review of Systems were negative.   Social History History  Substance Use Topics  . Smoking status: Former Smoker    Quit date: 05/10/2012  . Smokeless tobacco: Never Used  . Alcohol Use: No      Family History Family History  Problem Relation Age of Onset  . Colon cancer Neg Hx       Prior to Admission medications   Medication Sig Start Date End  Date Taking? Authorizing Provider  albuterol (PROVENTIL HFA;VENTOLIN HFA) 108 (90 BASE) MCG/ACT inhaler Inhale 2 puffs into the lungs every 6 (six) hours as needed for wheezing.   Yes Historical Provider, MD  amLODipine-valsartan (EXFORGE) 10-320 MG per tablet Take 1 tablet by mouth daily.  05/08/13  Yes Historical Provider, MD  aspirin 81 MG chewable tablet Chew 81 mg by mouth daily.   Yes Historical Provider, MD  Calcium Carbonate-Vit D-Min (CALCIUM 600+D PLUS MINERALS) 600-400 MG-UNIT TABS Take 1 tablet by mouth daily.   Yes Historical Provider, MD  carvedilol (COREG) 25 MG tablet Take 1 tablet (25 mg total) by mouth 2 (two) times daily with a meal. 08/26/12  Yes Ripudeep K Rai, MD  Cholecalciferol (D3 SUPER STRENGTH) 2000 UNITS CAPS Take 1 capsule by mouth daily.   Yes Historical Provider, MD  cloNIDine (CATAPRES - DOSED IN MG/24 HR) 0.2 mg/24hr patch Place 1 patch onto the skin once a week. On Sundays   Yes Historical Provider, MD   Ferrous Sulfate (IRON) 325 (65 FE) MG TABS Take 1 capsule by mouth daily.   Yes Historical Provider, MD  furosemide (LASIX) 40 MG tablet Take 1 tablet (40 mg total) by mouth 2 (two) times daily. 08/26/12  Yes Ripudeep Krystal Eaton, MD  HYDROcodone-homatropine (HYCODAN) 5-1.5 MG/5ML syrup Take 5 mLs by mouth every 6 (six) hours as needed for cough.  05/11/13  Yes Historical Provider, MD  insulin NPH-regular (NOVOLIN 70/30) (70-30) 100 UNIT/ML injection Take 15 units in AM and 10 units with dinner 04/14/13  Yes Verlee Monte, MD  metFORMIN (GLUCOPHAGE) 500 MG tablet Take 1,000 mg by mouth 2 (two) times daily with a meal.   Yes Historical Provider, MD  Olmesartan-Amlodipine-HCTZ (TRIBENZOR) 40-5-25 MG TABS Take 1 tablet by mouth daily.   Yes Historical Provider, MD  potassium chloride SA (K-DUR,KLOR-CON) 20 MEQ tablet Take 20 mEq by mouth daily.   Yes Historical Provider, MD  simvastatin (ZOCOR) 40 MG tablet Take 40 mg by mouth every evening.   Yes Historical Provider, MD  azithromycin (ZITHROMAX) 250 MG tablet Take 250 mg by mouth daily.  05/11/13   Historical Provider, MD  predniSONE (STERAPRED UNI-PAK) 10 MG tablet Take 10 mg by mouth daily.  05/11/13   Historical Provider, MD    Allergies  Allergen Reactions  . Lisinopril Cough    Physical Exam  Vitals  Blood pressure 171/81, pulse 73, resp. rate 21, SpO2 100.00%.   1. General middle-aged African American female lying in bed in NAD,     2. Normal affect and insight, Not Suicidal or Homicidal, Awake Alert, Oriented X 3.  3. No F.N deficits, ALL C.Nerves Intact, Strength 5/5 all 4 extremities, Sensation intact all 4 extremities, Plantars down going.  4. Ears and Eyes appear Normal, Conjunctivae clear, PERRLA. Moist Oral Mucosa.  5. Supple Neck, + JVD, No cervical lymphadenopathy appriciated, No Carotid Bruits.  6. Symmetrical Chest wall movement, Good air movement bilaterally, few basilar rales  7. RRR, No Gallops, Rubs or Murmurs, No  Parasternal Heave.  8. Positive Bowel Sounds, Abdomen Soft, Non tender, No organomegaly appriciated,No rebound -guarding or rigidity.  9.  No Cyanosis, Normal Skin Turgor, No Skin Rash or Bruise.  10. Good muscle tone,  joints appear normal , no effusions, Normal ROM.  11. No Palpable Lymph Nodes in Neck or Axillae     Data Review  CBC  Recent Labs Lab 05/23/13 0540  WBC 14.2*  HGB 10.7*  HCT 32.7*  PLT 425*  MCV 88.9  MCH 29.1  MCHC 32.7  RDW 12.8  LYMPHSABS 6.4*  MONOABS 1.0  EOSABS 0.3  BASOSABS 0.0   ------------------------------------------------------------------------------------------------------------------  Chemistries   Recent Labs Lab 05/23/13 0540  NA 135  K 3.5  CL 97  CO2 21  GLUCOSE 350*  BUN 18  CREATININE 1.56*  CALCIUM 8.6   ------------------------------------------------------------------------------------------------------------------ CrCl is unknown because both a height and weight (above a minimum accepted value) are required for this calculation. ------------------------------------------------------------------------------------------------------------------ No results found for this basename: TSH, T4TOTAL, FREET3, T3FREE, THYROIDAB,  in the last 72 hours   Coagulation profile No results found for this basename: INR, PROTIME,  in the last 168 hours ------------------------------------------------------------------------------------------------------------------- No results found for this basename: DDIMER,  in the last 72 hours -------------------------------------------------------------------------------------------------------------------  Cardiac Enzymes  Recent Labs Lab 05/23/13 0540  TROPONINI <0.30   ------------------------------------------------------------------------------------------------------------------ No components found with this basename: POCBNP,     ---------------------------------------------------------------------------------------------------------------  Urinalysis    Component Value Date/Time   COLORURINE STRAW* 05/23/2013 0615   APPEARANCEUR CLEAR 05/23/2013 0615   LABSPEC 1.013 05/23/2013 0615   PHURINE 6.5 05/23/2013 0615   GLUCOSEU 500* 05/23/2013 0615   HGBUR SMALL* 05/23/2013 0615   BILIRUBINUR NEGATIVE 05/23/2013 0615   KETONESUR NEGATIVE 05/23/2013 0615   PROTEINUR >300* 05/23/2013 0615   UROBILINOGEN 0.2 05/23/2013 0615   NITRITE NEGATIVE 05/23/2013 0615   LEUKOCYTESUR NEGATIVE 05/23/2013 0615    ----------------------------------------------------------------------------------------------------------------  Imaging results:   Dg Chest Portable 1 View  05/23/2013   CLINICAL DATA:  Congestive heart failure  EXAM: PORTABLE CHEST - 1 VIEW  COMPARISON:  03/31/2013  FINDINGS: Extensive bilateral interstitial and airspace disease. Chronic cardiopericardial enlargement. There could be small pleural effusions. No pneumothorax.  IMPRESSION: Congestive heart failure with extensive alveolar edema.   Electronically Signed   By: Jorje Guild M.D.   On: 05/23/2013 05:34    My personal review of EKG: Rhythm NSR,   no Acute ST changes    Assessment & Plan    1. Hypertensive crisis causing acute on chronic diastolic CHF and pulmonary edema. Patient will be admitted to step down, nitroglycerin drip will be titrated, oral blood pressure medications have been adjusted and will be given in the ER, once systolic blood pressure is less than 160 nitroglycerin drip will be removed. IV Lasix has already been given with good effect. Patient has been counseled on compliance with medications.    2. Acute respiratory failure due to acute on chronic diastolic CHF (EF [id number] on echogram done in January 2014)  secondary to hypertensive crisis. Blood pressure treatment as above, and long-acting nitrate, IV Lasix, fluid salt  restriction. Oxygen nebulizer treatment as needed, off of CPAP.     3. Diabetes mellitus type 2. Poor compliance, sugars greater than 300, check A1c, home insulin regimen adjusted, and sliding scale. Hold Glucophage for now    4. CK D. stage III baseline creatinine around 1.6. Close to baseline monitor.    5. History of asthma. No wheezing. No acute issues.    6. Dyslipidemia continue home dose statin.      DVT Prophylaxis Heparin    AM Labs Ordered, also please review Full Orders  Family Communication: Admission, patients condition and plan of care including tests being ordered have been discussed with the patient who indicates understanding and agree with the plan and Code Status.  Code Status DNR  Likely DC to  home  Condition GUARDED   Time spent in minutes : [place]  K M.D on 05/23/2013 at 8:43 AM  Between 7am to 7pm - Pager - [phone]  After 7pm go to [email or web address] - password [email or web address]  And look for the night coverage person covering me after hours  [facility] Office  [phone]

## 2013-05-23 NOTE — ED Notes (Signed)
Attempt to give report, receiving RN was in another patients room.

## 2013-05-23 NOTE — ED Notes (Signed)
Singh MD at bedside

## 2013-05-23 NOTE — Progress Notes (Signed)
CRITICAL VALUE ALERT  Critical value received: 60 Blood sugar  Date of notification:  05/23/2013  Time of notification:  2054  Critical value read back:yes  Nurse who received alert:  Glee Arvin  MD notified (1st page):  K.Kirby midlevel  Time of first page:  2084  MD notified (2nd page):  Time of second page:  Responding MD:  *K.Kirby  Time MD responded:

## 2013-05-23 NOTE — ED Notes (Signed)
Yacoumb MD at bedside.

## 2013-05-23 NOTE — ED Notes (Signed)
Report given to [facility] on 2600, pt will be transported to room 2612 via stretcher on monitor by [employer]

## 2013-05-23 NOTE — Progress Notes (Signed)
  Echocardiogram  2D Echocardiogram has been performed.  Mauricio Po 05/23/2013, 3:34 PM

## 2013-05-23 NOTE — ED Provider Notes (Signed)
CSN: [id number]     Arrival date & time 05/23/13  0454 History   First MD Initiated Contact with Patient 05/23/13 0525     Chief Complaint  Patient presents with  . Congestive Heart Failure   (Consider location/radiation/quality/duration/timing/severity/associated sxs/prior Treatment) HPI 62 year old female presents to emergency department from home with complaint of acute onset of shortness of breath.  At 1 AM this morning, along with elevated blood pressure.  Husband gives most of the history, as patient has severe respiratory distress.  Patient has had coughing of white frothy sputum and some posttussive emesis.  Initial O2 saturation 60% on room air.  She's had similar presentation in the past.  Husband reports "she is so stubborn, I try to get her to take her medications, but she doesn't".  Patient reports that she is compliant to medications, but noted not to be wearing clonidine patch prescribed to her.  She denies any alteration in her diet or increase salt.  Past Medical History  Diagnosis Date  . Hypertension   . Diverticulitis   . Hyperlipidemia   . Asthma   . Diabetes mellitus     insulin dependent  . Arthritis     knee  . GI bleed 03/31/2013  . CHF (congestive heart failure)   . Osteoporosis    Past Surgical History  Procedure Laterality Date  . Abdominal hysterectomy    . Esophagogastroduodenoscopy N/A 03/31/2013    Procedure: ESOPHAGOGASTRODUODENOSCOPY (EGD);  Surgeon: Gatha Mayer, MD;  Location: [facility] ENDOSCOPY;  Service: Endoscopy;  Laterality: N/A;   Family History  Problem Relation Age of Onset  . Colon cancer Neg Hx    History  Substance Use Topics  . Smoking status: Former Smoker    Quit date: 05/10/2012  . Smokeless tobacco: Never Used  . Alcohol Use: No   OB History   Grav Para Term Preterm Abortions TAB SAB Ect Mult Living                 Review of Systems  Unable to perform ROS: Acuity of condition    Allergies  Lisinopril  Home  Medications   Current Outpatient Rx  Name  Route  Sig  Dispense  Refill  . albuterol (PROVENTIL HFA;VENTOLIN HFA) 108 (90 BASE) MCG/ACT inhaler   Inhalation   Inhale 2 puffs into the lungs every 6 (six) hours as needed for wheezing.         Marland Kitchen amLODipine-valsartan (EXFORGE) 10-320 MG per tablet   Oral   Take 1 tablet by mouth daily.          Marland Kitchen aspirin 81 MG chewable tablet   Oral   Chew 81 mg by mouth daily.         . Calcium Carbonate-Vit D-Min (CALCIUM 600+D PLUS MINERALS) 600-400 MG-UNIT TABS   Oral   Take 1 tablet by mouth daily.         . carvedilol (COREG) 25 MG tablet   Oral   Take 1 tablet (25 mg total) by mouth 2 (two) times daily with a meal.   60 tablet   3   . Cholecalciferol (D3 SUPER STRENGTH) 2000 UNITS CAPS   Oral   Take 1 capsule by mouth daily.         . cloNIDine (CATAPRES - DOSED IN MG/24 HR) 0.2 mg/24hr patch   Transdermal   Place 1 patch onto the skin once a week. On Sundays         . Ferrous Sulfate (  IRON) 325 (65 FE) MG TABS   Oral   Take 1 capsule by mouth daily.         . furosemide (LASIX) 40 MG tablet   Oral   Take 1 tablet (40 mg total) by mouth 2 (two) times daily.   60 tablet   3   . HYDROcodone-homatropine (HYCODAN) 5-1.5 MG/5ML syrup   Oral   Take 5 mLs by mouth every 6 (six) hours as needed for cough.          . insulin NPH-regular (NOVOLIN 70/30) (70-30) 100 UNIT/ML injection      Take 15 units in AM and 10 units with dinner         . metFORMIN (GLUCOPHAGE) 500 MG tablet   Oral   Take 1,000 mg by mouth 2 (two) times daily with a meal.         . Olmesartan-Amlodipine-HCTZ (TRIBENZOR) 40-5-25 MG TABS   Oral   Take 1 tablet by mouth daily.         . potassium chloride SA (K-DUR,KLOR-CON) 20 MEQ tablet   Oral   Take 20 mEq by mouth daily.         . simvastatin (ZOCOR) 40 MG tablet   Oral   Take 40 mg by mouth every evening.         Marland Kitchen azithromycin (ZITHROMAX) 250 MG tablet   Oral   Take 250 mg  by mouth daily.          . predniSONE (STERAPRED UNI-PAK) 10 MG tablet   Oral   Take 10 mg by mouth daily.           BP 208/111  Pulse 96  Resp 25  SpO2 100% Physical Exam  Constitutional: She appears distressed.  HENT:  Head: Normocephalic and atraumatic.  Nose: Nose normal.  Mouth/Throat: Oropharynx is clear and moist.  Eyes: Conjunctivae and EOM are normal. Pupils are equal, round, and reactive to light.  Neck: Normal range of motion. Neck supple. JVD present. No tracheal deviation present. No thyromegaly present.  Cardiovascular: Intact distal pulses.  Exam reveals no gallop and no friction rub.   No murmur heard. Tachycardia noted  Pulmonary/Chest: No stridor. She is in respiratory distress. She has rales.  Tachypnea, tripod position, hypoxia  Abdominal: Soft. Bowel sounds are normal. She exhibits no distension and no mass. There is no tenderness. There is no rebound and no guarding.  Musculoskeletal: She exhibits edema.  Lymphadenopathy:    She has no cervical adenopathy.  Skin: She is diaphoretic.    ED Course  Procedures (including critical care time)  CRITICAL CARE Performed by: Kalman Drape Total critical care time: 60 min Critical care time was exclusive of separately billable procedures and treating other patients. Critical care was necessary to treat or prevent imminent or life-threatening deterioration. Critical care was time spent personally by me on the following activities: development of treatment plan with patient and/or surrogate as well as nursing, discussions with consultants, evaluation of patient's response to treatment, examination of patient, obtaining history from patient or surrogate, ordering and performing treatments and interventions, ordering and review of laboratory studies, ordering and review of radiographic studies, pulse oximetry and re-evaluation of patient's condition.  Labs Review Labs Reviewed  CBC WITH DIFFERENTIAL - Abnormal;  Notable for the following:    WBC 14.2 (*)    RBC 3.68 (*)    Hemoglobin 10.7 (*)    HCT 32.7 (*)    Platelets 425 (*)  Lymphs Abs 6.4 (*)    All other components within normal limits  BASIC METABOLIC PANEL - Abnormal; Notable for the following:    Glucose, Bld 350 (*)    Creatinine, Ser 1.56 (*)    GFR calc non Af Amer 35 (*)    GFR calc Af Amer 40 (*)    All other components within normal limits  PRO B NATRIURETIC PEPTIDE - Abnormal; Notable for the following:    Pro B Natriuretic peptide (BNP) 12897.0 (*)    All other components within normal limits  TROPONIN I  URINALYSIS, ROUTINE W REFLEX MICROSCOPIC   Imaging Review Dg Chest Portable 1 View  05/23/2013   CLINICAL DATA:  Congestive heart failure  EXAM: PORTABLE CHEST - 1 VIEW  COMPARISON:  03/31/2013  FINDINGS: Extensive bilateral interstitial and airspace disease. Chronic cardiopericardial enlargement. There could be small pleural effusions. No pneumothorax.  IMPRESSION: Congestive heart failure with extensive alveolar edema.   Electronically Signed   By: Jorje Guild M.D.   On: 05/23/2013 05:34     EKG Interpretation     Ventricular Rate:  115 PR Interval:  154 QRS Duration: 90 QT Interval:  370 QTC Calculation: 512 R Axis:   56 Text Interpretation:  Sinus tachycardia Probable left atrial enlargement Anteroseptal infarct, old Repol abnrm suggests ischemia, lateral leads Prolonged QT interval No significant change since last tracing            MDM   1. Hypertensive emergency   2. CHF (congestive heart failure)   3. Hyperglycemia without ketosis   4. Acute renal insufficiency   5. Medically noncompliant    62 yo female with acute shortness of breath tonight at 1 am.  Pt is critically ill upon arrival, poor air movement, hypoxia, rales, diaphoresis.  She was emergently started on bipap, ntg drip.  Drip required upward titration several times with minimal improvement.  BP improved with iv labetalol.  D/w  hospitalist who felt pt required ICU admission due to iv control of BP.  D/w ICU who will see and admit.  Pt's husband and patient updated on findings and plan.    Kalman Drape, MD 05/23/13 [phone]

## 2013-05-23 NOTE — Progress Notes (Addendum)
*  Preliminary Results* Bilateral lower extremity venous duplex completed. Bilateral lower extremities are negative for deep vein thrombosis. There is evidence of Baker's cyst bilaterally.  05/23/2013  Maudry Mayhew, RVT, RDCS, RDMS

## 2013-05-23 NOTE — ED Notes (Signed)
VO V&RB by Dr. Candiss Norse SBP around 160 you can take off of Nitro drip

## 2013-05-23 NOTE — ED Notes (Signed)
Husband noticed her having difficulty breathing around 0100 and it has progressively gotten worse throughout the night.  O2  Saturation was 60 on room air upon arrival.  Pt is posturing and coughing up sputum

## 2013-05-23 NOTE — Consult Note (Signed)
PULMONARY  / CRITICAL CARE MEDICINE  Name: Destiny Day MRN: [id number] DOB: 07/04/1951    ADMISSION DATE:  05/23/2013 CONSULTATION DATE:  05/23/2013  REFERRING MD :  EDP PRIMARY SERVICE: TRH  CHIEF COMPLAINT:  SOB and HTN emergency  BRIEF PATIENT DESCRIPTION: 62 year old with history of CHF who has been noticing increase in leg edema for the past 3-4 days presents to the hospital after going to bed in NAD but woke up at 4 AM gasping for air.  Upon presenting to the hospital was noted to have saturation in the 60's on RA that improved to 88% on 5L [place].  Patient was seen by EDP, NTG drip was started and patient was given 20 mg of IV labetalol and 40 mg of IV lasix and PCCM was asked to admit.  Upon evaluating the patient, she was speaking in full sentences, sat of 100% on 40% BiPAP.  BiPAP was removed and interestingly the patient felt better.  SBP in EDP upon my evaluation was 160 on 30 of NTG which was decreased to avoid over reduction in BP.  Patient denied any CP but did report orthopnea and PND.  History of asthma only on albuterol.  Patient normally on norvasc, valsartan, clonidine and coreg at home but does not remember if she took them all.  CXR in ED showed significant pulmonary edema and troponins were negative.  SIGNIFICANT EVENTS / STUDIES:  10/28 Respiratory failure due to pulmonary edema requiring BiPAP.  LINES / TUBES: PIV  CULTURES: None  ANTIBIOTICS: None  PAST MEDICAL HISTORY :  Past Medical History  Diagnosis Date  . Hypertension   . Diverticulitis   . Hyperlipidemia   . Asthma   . Diabetes mellitus     insulin dependent  . Arthritis     knee  . GI bleed 03/31/2013  . CHF (congestive heart failure)   . Osteoporosis    Past Surgical History  Procedure Laterality Date  . Abdominal hysterectomy    . Esophagogastroduodenoscopy N/A 03/31/2013    Procedure: ESOPHAGOGASTRODUODENOSCOPY (EGD);  Surgeon: Gatha Mayer, MD;  Location: [facility] ENDOSCOPY;  Service:  Endoscopy;  Laterality: N/A;   Prior to Admission medications   Medication Sig Start Date End Date Taking? Authorizing Provider  albuterol (PROVENTIL HFA;VENTOLIN HFA) 108 (90 BASE) MCG/ACT inhaler Inhale 2 puffs into the lungs every 6 (six) hours as needed for wheezing.   Yes Historical Provider, MD  amLODipine-valsartan (EXFORGE) 10-320 MG per tablet Take 1 tablet by mouth daily.  05/08/13  Yes Historical Provider, MD  aspirin 81 MG chewable tablet Chew 81 mg by mouth daily.   Yes Historical Provider, MD  Calcium Carbonate-Vit D-Min (CALCIUM 600+D PLUS MINERALS) 600-400 MG-UNIT TABS Take 1 tablet by mouth daily.   Yes Historical Provider, MD  carvedilol (COREG) 25 MG tablet Take 1 tablet (25 mg total) by mouth 2 (two) times daily with a meal. 08/26/12  Yes Ripudeep K Rai, MD  Cholecalciferol (D3 SUPER STRENGTH) 2000 UNITS CAPS Take 1 capsule by mouth daily.   Yes Historical Provider, MD  cloNIDine (CATAPRES - DOSED IN MG/24 HR) 0.2 mg/24hr patch Place 1 patch onto the skin once a week. On Sundays   Yes Historical Provider, MD  Ferrous Sulfate (IRON) 325 (65 FE) MG TABS Take 1 capsule by mouth daily.   Yes Historical Provider, MD  furosemide (LASIX) 40 MG tablet Take 1 tablet (40 mg total) by mouth 2 (two) times daily. 08/26/12  Yes Ripudeep Krystal Eaton, MD  HYDROcodone-homatropine (HYCODAN) 5-1.5 MG/5ML syrup Take 5 mLs by mouth every 6 (six) hours as needed for cough.  05/11/13  Yes Historical Provider, MD  insulin NPH-regular (NOVOLIN 70/30) (70-30) 100 UNIT/ML injection Take 15 units in AM and 10 units with dinner 04/14/13  Yes Verlee Monte, MD  metFORMIN (GLUCOPHAGE) 500 MG tablet Take 1,000 mg by mouth 2 (two) times daily with a meal.   Yes Historical Provider, MD  Olmesartan-Amlodipine-HCTZ (TRIBENZOR) 40-5-25 MG TABS Take 1 tablet by mouth daily.   Yes Historical Provider, MD  potassium chloride SA (K-DUR,KLOR-CON) 20 MEQ tablet Take 20 mEq by mouth daily.   Yes Historical Provider, MD  simvastatin  (ZOCOR) 40 MG tablet Take 40 mg by mouth every evening.   Yes Historical Provider, MD  azithromycin (ZITHROMAX) 250 MG tablet Take 250 mg by mouth daily.  05/11/13   Historical Provider, MD  predniSONE (STERAPRED UNI-PAK) 10 MG tablet Take 10 mg by mouth daily.  05/11/13   Historical Provider, MD   Allergies  Allergen Reactions  . Lisinopril Cough    FAMILY HISTORY:  Family History  Problem Relation Age of Onset  . Colon cancer Neg Hx    SOCIAL HISTORY:  reports that she quit smoking about a year ago. She has never used smokeless tobacco. She reports that she does not drink alcohol or use illicit drugs.  REVIEW OF SYSTEMS:  12 point review of system is negative other than above.  SUBJECTIVE: SOB.  VITAL SIGNS: Pulse Rate:  [52-115] 73 (10/28 0815) Resp:  [13-29] 21 (10/28 0815) BP: (142-251)/(75-226) 171/81 mmHg (10/28 0815) SpO2:  [87 %-100 %] 100 % (10/28 0815) FiO2 (%):  [40 %] 40 % (10/28 0720)  HEMODYNAMICS:   VENTILATOR SETTINGS: Vent Mode:  [-]  FiO2 (%):  [40 %] 40 % INTAKE / OUTPUT: Intake/Output   None    PHYSICAL EXAMINATION: General:  Chronically ill appearing female, currently in NAD. Neuro:  Alert and interactive, moving all ext to command. HEENT:  [place]/AT, PERRL, EOM-I and -LAN. Cardiovascular:  RRR, Nl S1/S2, -M/R/G. Lungs:  Diffuse crackles in all lung fields. Abdomen:  Soft, NT, ND and +BS. Musculoskeletal:  1+ edema R>L. Skin:  Intact.  LABS:  CBC Recent Labs     05/23/13  0540  WBC  14.2*  HGB  10.7*  HCT  32.7*  PLT  425*   Coag's No results found for this basename: APTT, INR,  in the last 72 hours BMET Recent Labs     05/23/13  0540  NA  135  K  3.5  CL  97  CO2  21  BUN  18  CREATININE  1.56*  GLUCOSE  350*   Electrolytes Recent Labs     05/23/13  0540  CALCIUM  8.6   Sepsis Markers No results found for this basename: LACTICACIDVEN, PROCALCITON, O2SATVEN,  in the last 72 hours ABG No results found for this basename:  PHART, PCO2ART, PO2ART,  in the last 72 hours Liver Enzymes No results found for this basename: AST, ALT, ALKPHOS, BILITOT, ALBUMIN,  in the last 72 hours Cardiac Enzymes Recent Labs     05/23/13  0540  TROPONINI  <0.30  PROBNP  12897.0*   Glucose No results found for this basename: GLUCAP,  in the last 72 hours  Imaging Dg Chest Portable 1 View  05/23/2013   CLINICAL DATA:  Congestive heart failure  EXAM: PORTABLE CHEST - 1 VIEW  COMPARISON:  03/31/2013  FINDINGS: Extensive bilateral interstitial  and airspace disease. Chronic cardiopericardial enlargement. There could be small pleural effusions. No pneumothorax.  IMPRESSION: Congestive heart failure with extensive alveolar edema.   Electronically Signed   By: Jorje Guild M.D.   On: 05/23/2013 05:34   CXR: Diffuse pulmonary edema.  ASSESSMENT / PLAN:  PULMONARY A: VDRF due to pulmonary edema.  History of asthma. P:   - D/C BiPAP. - Titrate O2 for sats of 88-92%. - Albuterol PRN. - IS per RT protocol.  CARDIOVASCULAR A: CHF by history, HTN urgency, unknown whether patient took her anti-HTN overnight.  Her primary cardiology is Dr. Einar Gip. P:  - Give half home dose of amlodipine and coreg. - Hold ARB. - Hold clonidine. - Titrate NTG to off for target BP of 160-170. - Follow troponin. - 2D echo ordered. - Recommend calling Dr. Einar Gip but will defer to [facility] as the admitting service.  RENAL A:  ARF likely diabetes and HTN related. P:   - Lasix given x1, will give two additional doses. - K-dur x2 doses given. - Strict I/O. - BMET in AM.  GASTROINTESTINAL A:  No active issues. P:   - If tolerating off BiPAP will consider starting diet, NPO for now.  HEMATOLOGIC A:  No active issues. P:  - CBC in AM.  INFECTIOUS A:  No active infection. P:   - Monitor WBC and fever curve.  ENDOCRINE A:  DM by history.  On Metformin.   P:   - Will defer to primary.  NEUROLOGIC A:  No active issues. P:   -  Monitor.  Patient stable for SDU, TRH to admit and PCCM will be available PRN.  I have personally obtained a history, examined the patient, evaluated laboratory and imaging results, formulated the assessment and plan and placed orders.  Rush Farmer, M.D. Pulmonary and [place] Pager: [phone]  05/23/2013, 8:21 AM

## 2013-05-23 NOTE — ED Notes (Addendum)
This RN spoke with Dr. Candiss Norse about pt's new DNR status at the request of the receiving RN. Dr. Candiss Norse reports this complete documentation can be found in pt's chart on the computer

## 2013-05-23 NOTE — ED Notes (Signed)
RT at bedside, VO  V&RB by [place] to take pt off of Bipap and place on 5L [place] titrate as needed to keep O2 sats above 88%

## 2013-05-24 LAB — CBC
HCT: 26.9 % — ABNORMAL LOW (ref 36.0–46.0)
Hemoglobin: 9.1 g/dL — ABNORMAL LOW (ref 12.0–15.0)
MCH: 29.4 pg (ref 26.0–34.0)
MCHC: 33.8 g/dL (ref 30.0–36.0)
MCV: 87.1 fL (ref 78.0–100.0)
Platelets: 336 10*3/uL (ref 150–400)
RBC: 3.09 MIL/uL — ABNORMAL LOW (ref 3.87–5.11)
RDW: 13 % (ref 11.5–15.5)
WBC: 9.8 10*3/uL (ref 4.0–10.5)

## 2013-05-24 LAB — GLUCOSE, CAPILLARY
Glucose-Capillary: 78 mg/dL (ref 70–99)
Glucose-Capillary: 140 mg/dL — ABNORMAL HIGH (ref 70–99)
Glucose-Capillary: 127 mg/dL — ABNORMAL HIGH (ref 70–99)
Glucose-Capillary: 80 mg/dL (ref 70–99)

## 2013-05-24 LAB — BASIC METABOLIC PANEL
BUN: 26 mg/dL — ABNORMAL HIGH (ref 6–23)
CO2: 26 mEq/L (ref 19–32)
Calcium: 8.4 mg/dL (ref 8.4–10.5)
Chloride: 99 mEq/L (ref 96–112)
Creatinine, Ser: 1.94 mg/dL — ABNORMAL HIGH (ref 0.50–1.10)
GFR calc Af Amer: 31 mL/min — ABNORMAL LOW (ref >90)
GFR calc non Af Amer: 27 mL/min — ABNORMAL LOW (ref >90)
Glucose, Bld: 55 mg/dL — ABNORMAL LOW (ref 70–99)
Potassium: 3.5 mEq/L (ref 3.5–5.1)
Sodium: 134 mEq/L — ABNORMAL LOW (ref 135–145)

## 2013-05-24 LAB — PHOSPHORUS: Phosphorus: 3.7 mg/dL (ref 2.3–4.6)

## 2013-05-24 LAB — MAGNESIUM: Magnesium: 1.8 mg/dL (ref 1.5–2.5)

## 2013-05-24 MED ORDER — ALPRAZOLAM 0.5 MG PO TABS
0.5000 mg | ORAL_TABLET | Freq: Three times a day (TID) | ORAL | Status: DC | PRN
  Administered 2013-05-24 – 2013-05-29 (×5): 0.5 mg via ORAL
  Filled 2013-05-24 (×5): qty 1

## 2013-05-24 MED ORDER — INSULIN ASPART PROT & ASPART (70-30 MIX) 100 UNIT/ML ~~LOC~~ SUSP
15.0000 [IU] | Freq: Every day | SUBCUTANEOUS | Status: DC
  Administered 2013-05-25: 15 [IU] via SUBCUTANEOUS
  Filled 2013-05-24: qty 10

## 2013-05-24 MED ORDER — INSULIN ASPART PROT & ASPART (70-30 MIX) 100 UNIT/ML ~~LOC~~ SUSP
10.0000 [IU] | Freq: Every day | SUBCUTANEOUS | Status: DC
Start: 2013-05-24 — End: 2013-05-26
  Administered 2013-05-24 – 2013-05-25 (×2): 10 [IU] via SUBCUTANEOUS

## 2013-05-24 NOTE — Progress Notes (Signed)
[facility] Progress Note [place] TEAM 1 - Stepdown/ICU TEAM   Destiny Day [id number] DOB: 03-30-1951 DOA: 05/23/2013 PCP: Salena Saner., MD  Admit HPI / Brief Narrative: 62 y.o. female, with history of hypertension, type 2 diabetes mellitus, poor medication compliance, chronic diastolic CHF, asthma, dyslipidemia, and arthritis who presented to the hospital with a chief complaints of shortness of breath.  In the ER she was found to be in hypertensive crisis with blood pressure 220/115.  Per husband patient is not compliant with her medications.  Her glucose was in excess of 300 and her chest x-ray was suggestive of pulmonary edema.  She was given nitroglycerin IV drip and Lasix along with CPAP with good results.    Assessment/Plan:  Hypertensive crisis causing acute on chronic diastolic CHF and pulmonary edema - BP now well controlled - likely a noncompliance issue - pt states "lasix was taking the fluid off like in the beginning" - may require higher home diuretic dose (vows compliance w/ fluid restriction of 2L)  Acute hypoxic respiratory failure due to acute on chronic diastolic CHF (EF 123456 on echogram done in January 2014) secondary to hypertensive crisis - much improved - begin to wean O2  Chronic diastolic CHF with acute exacerbation due to HTN crisis  tx plan as discussed above - follow Is/Os and daily weights   Diabetes mellitus type 2 CBGs erratic - adjust tx gently and follow trend - A1c markedly elevated at 12.2, likely due to dietary and medication non-compliance at home   CKD stage III baseline creatinine around 1.6 - follow trend with stabilization in BP - avoid potential nephrotoxins - balance diuresis with adequate renal perfusion   Dyslipidemia continue home dose statin  Code Status: NO CODE Family Communication: no family present at time of exam Disposition Plan: transfer to tele bed - cont to diurese - follow CBG - ambulate - possible d/c  in 48hrs or more   Consultants: none  Procedures: none  Antibiotics: none  DVT prophylaxis: lovenox  HPI/Subjective: The patient states she feels better overall but is not yet back to her baseline.  She feels some shortness of breath when attempting to ambulate.  She denies chest pain nausea vomiting fevers chills or abdominal pain.  Objective: Blood pressure 130/62, pulse 64, temperature 97.8 F (36.6 C), temperature source Oral, resp. rate 18, height 5\' 4"  (1.626 m), weight 75 kg (165 lb 5.5 oz), SpO2 100.00%.  Intake/Output Summary (Last 24 hours) at 05/24/13 1228 Last data filed at 05/24/13 1202  Gross per 24 hour  Intake  961.5 ml  Output   1950 ml  Net -988.5 ml   Exam: General: No acute respiratory distress at rest Lungs: Mild bilateral basilar crackles with no wheeze with good air movement throughout other fields Cardiovascular: Regular rate and rhythm without murmur gallop or rub normal S1 and S2 Abdomen: Nontender, nondistended, soft, bowel sounds positive, no rebound, no ascites, no appreciable mass Extremities: 1+ bilateral lower extremity edema without cyanosis or clubbing appreciable  Data Reviewed: Basic Metabolic Panel:  Recent Labs Lab 05/23/13 0540 05/23/13 0852 05/24/13 0503  NA 135  --  134*  K 3.5  --  3.5  CL 97  --  99  CO2 21  --  26  GLUCOSE 350*  --  55*  BUN 18  --  26*  CREATININE 1.56* 1.62* 1.94*  CALCIUM 8.6  --  8.4  MG  --   --  1.8  PHOS  --   --  3.7   Liver Function Tests: No results found for this basename: AST, ALT, ALKPHOS, BILITOT, PROT, ALBUMIN,  in the last 168 hours  CBC:  Recent Labs Lab 05/23/13 0540 05/23/13 1215 05/24/13 0503  WBC 14.2* 11.6* 9.8  NEUTROABS 6.5  --   --   HGB 10.7* 9.7* 9.1*  HCT 32.7* 28.6* 26.9*  MCV 88.9 86.4 87.1  PLT 425* 349 336   Cardiac Enzymes:  Recent Labs Lab 05/23/13 0540 05/23/13 0852 05/23/13 1515 05/23/13 2045  TROPONINI <0.30 <0.30 <0.30 <0.30   BNP (last 3  results)  Recent Labs  08/23/12 2335 05/23/13 0540  PROBNP 7978.0* 12897.0*   CBG:  Recent Labs Lab 05/23/13 2038 05/23/13 2205 05/23/13 2306 05/24/13 0803 05/24/13 1156  GLUCAP 60* 69* 95 78 140*    Recent Results (from the past 240 hour(s))  MRSA PCR SCREENING     Status: None   Collection Time    05/23/13 11:46 AM      Result Value Range Status   MRSA by PCR NEGATIVE  NEGATIVE Final   Comment:            The GeneXpert MRSA Assay (FDA     approved for NASAL specimens     only), is one component of a     comprehensive MRSA colonization     surveillance program. It is not     intended to diagnose MRSA     infection nor to guide or     monitor treatment for     MRSA infections.     Studies:  Recent x-ray studies have been reviewed in detail by the Attending Physician  Scheduled Meds:  Scheduled Meds: . amLODipine  10 mg Oral Daily   And  . valsartan  320 mg Oral Daily  . aspirin  81 mg Oral Daily  . atorvastatin  20 mg Oral q1800  . carvedilol  25 mg Oral BID WC  . cloNIDine  0.1 mg Oral TID  . ferrous sulfate  325 mg Oral Q breakfast  . furosemide  40 mg Intravenous BID  . heparin  5,000 Units Subcutaneous Q8H  . insulin aspart  0-9 Units Subcutaneous TID WC  . insulin aspart protamine- aspart  25 Units Subcutaneous BID WC  . isosorbide mononitrate  30 mg Oral Daily  . sodium chloride  3 mL Intravenous Q12H    Time spent on care of this patient: 35 mins   [place]  [phone] Pager - Text Page per Shea Evans as per below:  On-Call/Text Page:      [email or web address]      password [email or web address]  If 7PM-7AM, please contact night-coverage [email or web address] Password [email or web address] 05/24/2013, 12:28 PM   LOS: 1 day

## 2013-05-24 NOTE — Progress Notes (Signed)
Pt transferred from [facility] to 4E15.  Pt has been placed on telemetry and is currently NSR.  Vitals taken.  Afebrile.  BP 155/78, HR 70, R 16, 100% on RA.  CCMD has been notified of pt tx and information has been confirmed.  Pt has been oriented to unit.  Call bell within reach.  Will review orders and follow up. Gae Gallop RN

## 2013-05-24 NOTE — Progress Notes (Signed)
Inpatient Diabetes Program Recommendations  AACE/ADA: New Consensus Statement on Inpatient Glycemic Control (2013)  Target Ranges:  Prepandial:   less than 140 mg/dL      Peak postprandial:   less than 180 mg/dL (1-2 hours)      Critically ill patients:  140 - 180 mg/dL  Results for TAVIONA, TRIBE (MRN [id number]) as of 05/24/2013 09:46  Ref. Range 05/23/2013 17:13 05/23/2013 20:38 05/23/2013 22:05 05/23/2013 23:06 05/24/2013 08:03  Glucose-Capillary Latest Range: 70-99 mg/dL 318 (H) 60 (L) 69 (L) 95 78  Results for JUSTYNE, BARTLETTE A (MRN [id number]) as of 05/24/2013 09:46  Ref. Range 05/24/2013 05:03  Glucose Latest Range: 70-99 mg/dL 55 (L)   Inpatient Diabetes Program Recommendations Insulin - Basal: decrease 70/30 to home doses per med reconciliation  HgbA1C: =12.2 considering low Hgb. Thank you  Raoul Pitch BSN, RN,CDE Inpatient Diabetes Coordinator [phone] (team pager)

## 2013-05-24 NOTE — Evaluation (Signed)
Occupational Therapy Evaluation Patient Details Name: Destiny Day MRN: [id number] DOB: Feb 21, 1951 Today's Date: 05/24/2013 Time: [id number] OT Time Calculation (min): 17 min  OT Assessment / Plan / Recommendation History of present illness 62 y.o. female, with history of hypertension, type 2 diabetes mellitus, poor medication compliance, chronic diastolic CHF, asthma, dyslipidemia, and arthritis who presented to the hospital with a chief complaints of shortness of breath.  In the ER she was found to be in hypertensive crisis with blood pressure 220/115.  Per husband patient is not compliant with her medications   Clinical Impression   Pt admitted with above.  She presents to OT with generalized weakness and impaired balance.  SHe will benefit from continued acute OT to maximize safety and independence with BADLs.  Feel she would benefit from [facility]; however, pt report that her copays are >$100/visit for [facility], and that she can't afford it.   Discussed options for tub DME.  Will continue to follow     OT Assessment  Patient needs continued OT Services    Follow Up Recommendations  Home health OT;Supervision/Assistance - 24 hour    Barriers to Discharge Decreased caregiver support spouse works  Diplomatic Services operational officer for Other Services    Frequency  Min 2X/week    Precautions / Restrictions Precautions Precautions: Fall   Pertinent Vitals/Pain     ADL  Eating/Feeding: Independent Where Assessed - Eating/Feeding: Chair Grooming: Wash/dry hands;Wash/dry face;Teeth care;Minimal assistance Where Assessed - Grooming: Supported standing;Unsupported standing Upper Body Bathing: Set up;Supervision/safety Where Assessed - Upper Body Bathing: Unsupported sitting Lower Body Bathing: Minimal assistance Where Assessed - Lower Body Bathing: Supported sit to stand Upper Body Dressing: Set up;Supervision/safety Where Assessed - Upper Body Dressing:  Unsupported sitting Lower Body Dressing: Minimal assistance Where Assessed - Lower Body Dressing: Unsupported sit to stand;Supported sit to Lobbyist: Psychiatric nurse Method: Sit to Loss adjuster, chartered: Comfort height toilet Toileting - Water quality scientist and Hygiene: Minimal assistance Where Assessed - Best boy and Hygiene: Standing Transfers/Ambulation Related to ADLs: min a ADL Comments: Pt requires min A due to decreased balance.  Discussed options for tub DME - tub chair vs. tub transfer bench    OT Diagnosis: Generalized weakness  OT Problem List: Decreased strength;Decreased activity tolerance;Impaired balance (sitting and/or standing);Decreased knowledge of use of DME or AE OT Treatment Interventions: Self-care/ADL training;DME and/or AE instruction;Therapeutic activities;Patient/family education;Balance training   OT Goals(Current goals can be found in the care plan section) Acute Rehab OT Goals Patient Stated Goal: To get better OT Goal Formulation: With patient Time For Goal Achievement: 05/31/13 Potential to Achieve Goals: Good ADL Goals Pt Will Perform Grooming: with modified independence;standing Pt Will Perform Lower Body Bathing: with modified independence;sit to/from stand Pt Will Perform Lower Body Dressing: with modified independence;sit to/from stand Pt Will Transfer to Toilet: with modified independence;regular height toilet;ambulating Pt Will Perform Toileting - Clothing Manipulation and hygiene: with modified independence;sit to/from stand Pt Will Perform Tub/Shower Transfer: Tub transfer;with modified independence;ambulating;tub bench  Visit Information  Last OT Received On: 05/24/13 Assistance Needed: +1 History of Present Illness: 62 y.o. female, with history of hypertension, type 2 diabetes mellitus, poor medication compliance, chronic diastolic CHF, asthma, dyslipidemia, and arthritis who  presented to the hospital with a chief complaints of shortness of breath.  In the ER she was found to be in hypertensive crisis with blood pressure 220/115.  Per husband patient is not compliant with her medications  Prior [place] expects to be discharged to:: Private residence Living Arrangements: Spouse/significant other Available Help at Discharge: Available PRN/intermittently (spouse works during day) Type of Home: [place] Access: Level entry (from garage) Home Layout: Two level;Able to live on main level with bedroom/bathroom (but her bedroom is upstairs) Alternate Level Stairs-Number of Steps: 12 Alternate Level Stairs-Rails: Right Home Equipment: Walker - 2 wheels Additional Comments: Pt has a borrowed walker Prior Function Level of Independence: Independent Comments: Pt reports she was independent with BADLs PTA Communication Communication: No difficulties Dominant Hand: Right         Vision/Perception     Cognition  Cognition Arousal/Alertness: Awake/alert Behavior During Therapy: WFL for tasks assessed/performed Overall Cognitive Status: Within Functional Limits for tasks assessed    Extremity/Trunk Assessment Upper Extremity Assessment Upper Extremity Assessment: Overall WFL for tasks assessed Lower Extremity Assessment Lower Extremity Assessment: Defer to PT evaluation Cervical / Trunk Assessment Cervical / Trunk Assessment: Normal     Mobility Bed Mobility Bed Mobility: Not assessed Transfers Transfers: Sit to Stand;Stand to Sit Sit to Stand: 4: Min assist;With upper extremity assist;From chair/3-in-1 Stand to Sit: 4: Min assist;With upper extremity assist;To chair/3-in-1 Details for Transfer Assistance: assist to steady     Exercise     Balance     End of Session OT - End of Session Activity Tolerance: Patient limited by fatigue Patient left: in chair;with call bell/phone within reach;Other (comment)  (in w/c to be transported to 4E) Nurse Communication: Mobility status  GO     Davon Abdelaziz, Ellard Artis M 05/24/2013, 5:25 PM

## 2013-05-24 NOTE — Progress Notes (Signed)
Pt transferred from [facility] with foley catheter in place.  Currently no indication to keep foley in place.  Foley d/c'd at 17:30 pm.  Will continue to monitor for post removal void. Gae Gallop RN

## 2013-05-24 NOTE — Progress Notes (Signed)
Pt has voided post catheter removal. Gae Gallop RN

## 2013-05-24 NOTE — Progress Notes (Signed)
RN arrived to pt room to find pt sitting on the bedside appearing tearful.  Pt states "I am sorry.  I am having an anxiety attack."  Pt comforted.  MD notified.  New orders given. Gae Gallop RN

## 2013-05-25 DIAGNOSIS — I119 Hypertensive heart disease without heart failure: Secondary | ICD-10-CM | POA: Diagnosis present

## 2013-05-25 DIAGNOSIS — I5033 Acute on chronic diastolic (congestive) heart failure: Secondary | ICD-10-CM

## 2013-05-25 DIAGNOSIS — N183 Chronic kidney disease, stage 3 unspecified: Secondary | ICD-10-CM | POA: Diagnosis present

## 2013-05-25 LAB — CBC
HCT: 30.2 % — ABNORMAL LOW (ref 36.0–46.0)
Hemoglobin: 9.7 g/dL — ABNORMAL LOW (ref 12.0–15.0)
MCH: 28.1 pg (ref 26.0–34.0)
MCHC: 32.1 g/dL (ref 30.0–36.0)
MCV: 87.5 fL (ref 78.0–100.0)
Platelets: 360 10*3/uL (ref 150–400)
RBC: 3.45 MIL/uL — ABNORMAL LOW (ref 3.87–5.11)
RDW: 12.6 % (ref 11.5–15.5)
WBC: 8.6 10*3/uL (ref 4.0–10.5)

## 2013-05-25 LAB — BASIC METABOLIC PANEL
BUN: 32 mg/dL — ABNORMAL HIGH (ref 6–23)
CO2: 27 mEq/L (ref 19–32)
Calcium: 8.8 mg/dL (ref 8.4–10.5)
Chloride: 98 mEq/L (ref 96–112)
Creatinine, Ser: 1.86 mg/dL — ABNORMAL HIGH (ref 0.50–1.10)
GFR calc Af Amer: 32 mL/min — ABNORMAL LOW (ref >90)
GFR calc non Af Amer: 28 mL/min — ABNORMAL LOW (ref >90)
Glucose, Bld: 198 mg/dL — ABNORMAL HIGH (ref 70–99)
Potassium: 4 mEq/L (ref 3.5–5.1)
Sodium: 137 mEq/L (ref 135–145)

## 2013-05-25 LAB — GLUCOSE, CAPILLARY
Glucose-Capillary: 228 mg/dL — ABNORMAL HIGH (ref 70–99)
Glucose-Capillary: 130 mg/dL — ABNORMAL HIGH (ref 70–99)
Glucose-Capillary: 113 mg/dL — ABNORMAL HIGH (ref 70–99)
Glucose-Capillary: 138 mg/dL — ABNORMAL HIGH (ref 70–99)

## 2013-05-25 MED ORDER — ISOSORB DINITRATE-HYDRALAZINE 20-37.5 MG PO TABS
1.0000 | ORAL_TABLET | Freq: Two times a day (BID) | ORAL | Status: DC
  Administered 2013-05-25 – 2013-05-27 (×6): 1 via ORAL
  Filled 2013-05-25 (×8): qty 1

## 2013-05-25 MED ORDER — INSULIN ASPART PROT & ASPART (70-30 MIX) 100 UNIT/ML ~~LOC~~ SUSP
22.0000 [IU] | Freq: Every day | SUBCUTANEOUS | Status: DC
  Administered 2013-05-26: 22 [IU] via SUBCUTANEOUS
  Filled 2013-05-25: qty 10

## 2013-05-25 MED ORDER — HYDRALAZINE HCL 10 MG PO TABS: 10.0000 mg | ORAL_TABLET | Freq: Three times a day (TID) | ORAL | Status: DC

## 2013-05-25 MED ORDER — INSULIN ASPART PROT & ASPART (70-30 MIX) 100 UNIT/ML ~~LOC~~ SUSP: 25.0000 [IU] | Freq: Every day | SUBCUTANEOUS | Status: DC

## 2013-05-25 MED ORDER — FUROSEMIDE 10 MG/ML IJ SOLN
40.0000 mg | Freq: Three times a day (TID) | INTRAMUSCULAR | Status: DC
  Administered 2013-05-25 (×2): 40 mg via INTRAVENOUS
  Filled 2013-05-25 (×4): qty 4

## 2013-05-25 NOTE — Progress Notes (Signed)
[facility] PROGRESS NOTE  Assessment/Plan: Hypertensive heart disease/*Malignant hypertension - BP stable, elevation likely due to non compliance. - On norvasc, valsartan, coreg, lasix, add Bidil. D/c clonidine and imdur.  - Good UOP on current dose. - I think her Cr will continue to rise as I think it is hemodiluted. She still has JVD and lower extremity edema. - Will call her cardiologist. - Estimated dry weight is 140 lb.  Diastolic CHF, acute on chronic - On lasix, max dose ARB and cored still her BP is High - Add Bidil. - D/c clonidine as there is no decrease mortality in heart failure with clonidine.  Acute respiratory failure due to  Pulmonary edema - Resolved due to non complains wit lasix. - sat >90% on RA.  Diabetic nephropathy/CKD stage 3 - Creat 1.6 - i think her Cr. Is hemodiluted as the pt becomes evulosemic. Her creatinine will increase.  DIABETES MELLITUS, TYPE II - poorly controlled. - increase 70/30.   HYPERLIPIDEMIA - cont statins    Code Status: full Family Communication: none  Disposition Plan: inaptient   Consultants:  none  Procedures:  ECho pending  Antibiotics:  none  HPI/Subjective: Relates her SOB is improved.  Objective: Filed Vitals:   05/24/13 1500 05/24/13 1622 05/24/13 2047 05/25/13 0523  BP:  155/78 121/52 168/60  Pulse: 64 70 64 68  Temp:  97.5 F (36.4 C) 97.5 F (36.4 C) 97.4 F (36.3 C)  TempSrc:  Oral Oral Oral  Resp: 15 16 18 18   Height:  5\' 4"  (1.626 m)    Weight:  72.303 kg (159 lb 6.4 oz)  72.848 kg (160 lb 9.6 oz)  SpO2: 99% 100% 99% 100%    Intake/Output Summary (Last 24 hours) at 05/25/13 0855 Last data filed at 05/25/13 0500  Gross per 24 hour  Intake   1080 ml  Output   2150 ml  Net  -1070 ml   Filed Weights   05/24/13 0500 05/24/13 1622 05/25/13 0523  Weight: 75 kg (165 lb 5.5 oz) 72.303 kg (159 lb 6.4 oz) 72.848 kg (160 lb 9.6 oz)    Exam:  General: Alert, awake, oriented x3, in  no acute distress.  HEENT: No bruits, no goiter. +JVD Heart: Regular rate and rhythm, lower extremity edema. Lungs: Good air movement, bilateral air movement.  Abdomen: Soft, nontender, nondistended, positive bowel sounds.  Neuro: Grossly intact, nonfocal.   Data Reviewed: Basic Metabolic Panel:  Recent Labs Lab 05/23/13 0540 05/23/13 0852 05/24/13 0503 05/25/13 0516  NA 135  --  134* 137  K 3.5  --  3.5 4.0  CL 97  --  99 98  CO2 21  --  26 27  GLUCOSE 350*  --  55* 198*  BUN 18  --  26* 32*  CREATININE 1.56* 1.62* 1.94* 1.86*  CALCIUM 8.6  --  8.4 8.8  MG  --   --  1.8  --   PHOS  --   --  3.7  --    Liver Function Tests: No results found for this basename: AST, ALT, ALKPHOS, BILITOT, PROT, ALBUMIN,  in the last 168 hours No results found for this basename: LIPASE, AMYLASE,  in the last 168 hours No results found for this basename: AMMONIA,  in the last 168 hours CBC:  Recent Labs Lab 05/23/13 0540 05/23/13 1215 05/24/13 0503 05/25/13 0516  WBC 14.2* 11.6* 9.8 8.6  NEUTROABS 6.5  --   --   --   HGB 10.7*  9.7* 9.1* 9.7*  HCT 32.7* 28.6* 26.9* 30.2*  MCV 88.9 86.4 87.1 87.5  PLT 425* 349 336 360   Cardiac Enzymes:  Recent Labs Lab 05/23/13 0540 05/23/13 0852 05/23/13 1515 05/23/13 2045  TROPONINI <0.30 <0.30 <0.30 <0.30   BNP (last 3 results)  Recent Labs  08/23/12 2335 05/23/13 0540  PROBNP 7978.0* 12897.0*   CBG:  Recent Labs Lab 05/24/13 0803 05/24/13 1156 05/24/13 1605 05/24/13 2127 05/25/13 0614  GLUCAP 78 140* 127* 80 228*    Recent Results (from the past 240 hour(s))  MRSA PCR SCREENING     Status: None   Collection Time    05/23/13 11:46 AM      Result Value Range Status   MRSA by PCR NEGATIVE  NEGATIVE Final   Comment:            The GeneXpert MRSA Assay (FDA     approved for NASAL specimens     only), is one component of a     comprehensive MRSA colonization     surveillance program. It is not     intended to diagnose  MRSA     infection nor to guide or     monitor treatment for     MRSA infections.     Studies: No results found.  Scheduled Meds: . amLODipine  10 mg Oral Daily   And  . valsartan  320 mg Oral Daily  . aspirin  81 mg Oral Daily  . atorvastatin  20 mg Oral q1800  . carvedilol  25 mg Oral BID WC  . cloNIDine  0.1 mg Oral TID  . ferrous sulfate  325 mg Oral Q breakfast  . furosemide  40 mg Intravenous BID  . heparin  5,000 Units Subcutaneous Q8H  . insulin aspart  0-9 Units Subcutaneous TID WC  . insulin aspart protamine- aspart  10 Units Subcutaneous Q supper  . insulin aspart protamine- aspart  15 Units Subcutaneous Q breakfast  . isosorbide mononitrate  30 mg Oral Daily  . sodium chloride  3 mL Intravenous Q12H   Continuous Infusions:    Charlynne Cousins  [facility] Pager [phone]. If 8PM-8AM, please contact night-coverage at [email or web address], password [facility] 05/25/2013, 8:55 AM  LOS: 2 days

## 2013-05-25 NOTE — Evaluation (Signed)
Physical Therapy Evaluation Patient Details Name: Destiny Day MRN: [id number] DOB: 1950-11-11 Today's Date: 05/25/2013 Time: [id number] PT Time Calculation (min): 22 min  PT Assessment / Plan / Recommendation History of Present Illness  62 y.o. female, with history of hypertension, type 2 diabetes mellitus, poor medication compliance, chronic diastolic CHF, asthma, dyslipidemia, and arthritis who presented to the hospital with a chief complaints of shortness of breath.  In the ER she was found to be in hypertensive crisis with blood pressure 220/115.  Per husband patient is not compliant with her medications  Clinical Impression  Pt very pleasant and eager to be able to care for herself again. Pt with below deficits of gait, balance and function who will benefit from acute therapy to maximize mobility, safety and function to decrease burden of care. Pt states HHPT is too high of a copay and she will not be able to have these services but understands the recommendation. Pt encouraged to increase mobility in general and discussed the ability [place] had [place].     PT Assessment  Patient needs continued PT services    Follow Up Recommendations  Home health PT;Supervision - Intermittent    Does the patient have the potential to tolerate intense rehabilitation      Barriers to Discharge Decreased caregiver support      Equipment Recommendations  Rolling walker with 5" wheels    Recommendations for Other Services     Frequency Min 3X/week    Precautions / Restrictions Precautions Precautions: Fall   Pertinent Vitals/Pain No pain      Mobility  Bed Mobility Bed Mobility: Not assessed Details for Bed Mobility Assistance: pt standing on arrival and sitting EOB end of session Transfers Stand to Sit: 4: Min guard;To bed Ambulation/Gait Ambulation/Gait Assistance: 4: Min guard Ambulation Distance (Feet): 300 Feet Assistive device:  Rolling walker Ambulation/Gait Assistance Details: Pt standing holding onto spouse on arrival and walked 47ft in room reaching for environmental supports throughout, use of RW throughout gait with increased stability but continued slow cautious gait Gait Pattern: Step-through pattern;Decreased stride length Gait velocity: decreased 20'/42 sec= .476 placing pt at high fall risk Stairs: Yes Stairs Assistance: 5: Supervision Stairs Assistance Details (indicate cue type and reason): supervision for safety with education for how to ascend with folded RW Stair Management Technique: One rail Left;Forwards;Step to pattern Number of Stairs: 11    Exercises     PT Diagnosis: Difficulty walking  PT Problem List: Decreased strength;Decreased activity tolerance;Decreased balance;Decreased mobility;Decreased knowledge of use of DME PT Treatment Interventions: Gait training;Functional mobility training;Therapeutic activities;Therapeutic exercise;Patient/family education;Balance training;DME instruction     PT Goals(Current goals can be found in the care plan section) Acute Rehab PT Goals Patient Stated Goal: be able to walk better PT Goal Formulation: With patient Time For Goal Achievement: 06/08/13 Potential to Achieve Goals: Fair  Visit Information  Last PT Received On: 05/25/13 Assistance Needed: +1 History of Present Illness: 62 y.o. female, with history of hypertension, type 2 diabetes mellitus, poor medication compliance, chronic diastolic CHF, asthma, dyslipidemia, and arthritis who presented to the hospital with a chief complaints of shortness of breath.  In the ER she was found to be in hypertensive crisis with blood pressure 220/115.  Per husband patient is not compliant with her medications       Prior [place] expects to be discharged to:: Private residence Living Arrangements: Spouse/significant other Available Help at Discharge:  Available  PRN/intermittently (spouse works during day) Type of Home: House Home Access: Level entry (from garage) Home Layout: Two level;Bed/bath upstairs (but her bedroom is upstairs, split level) Alternate Level Stairs-Number of Steps: 2 sets of 7 Alternate Level Stairs-Rails: Left Home Equipment: Walker - 2 wheels Additional Comments: Pt has a borrowed walker Prior Function Level of Independence: Independent Comments: Pt reports she was independent with BADLs PTA Communication Communication: No difficulties Dominant Hand: Right    Cognition  Cognition Arousal/Alertness: Awake/alert Behavior During Therapy: WFL for tasks assessed/performed Overall Cognitive Status: Within Functional Limits for tasks assessed    Extremity/Trunk Assessment Lower Extremity Assessment Lower Extremity Assessment: Generalized weakness (Right hip flexion 3/5, all other bil LE myotomes 4/5) Cervical / Trunk Assessment Cervical / Trunk Assessment: Normal   Balance    End of Session PT - End of Session Equipment Utilized During Treatment: Gait belt Activity Tolerance: Patient tolerated treatment well Patient left: in bed;with call bell/phone within reach;with nursing/sitter in room (EOB) Nurse Communication: Mobility status  GP     Lanetta Inch Beth 05/25/2013, 1:14 PM Elwyn Reach, [place]

## 2013-05-26 LAB — CREATININE, URINE, RANDOM: Creatinine, Urine: 62.99 mg/dL

## 2013-05-26 LAB — GLUCOSE, CAPILLARY
Glucose-Capillary: 134 mg/dL — ABNORMAL HIGH (ref 70–99)
Glucose-Capillary: 102 mg/dL — ABNORMAL HIGH (ref 70–99)
Glucose-Capillary: 53 mg/dL — ABNORMAL LOW (ref 70–99)
Glucose-Capillary: 56 mg/dL — ABNORMAL LOW (ref 70–99)
Glucose-Capillary: 58 mg/dL — ABNORMAL LOW (ref 70–99)
Glucose-Capillary: 124 mg/dL — ABNORMAL HIGH (ref 70–99)
Glucose-Capillary: 243 mg/dL — ABNORMAL HIGH (ref 70–99)
Glucose-Capillary: 198 mg/dL — ABNORMAL HIGH (ref 70–99)
Glucose-Capillary: 157 mg/dL — ABNORMAL HIGH (ref 70–99)

## 2013-05-26 LAB — SODIUM, URINE, RANDOM: Sodium, Ur: 42 mEq/L

## 2013-05-26 MED ORDER — METOLAZONE 2.5 MG PO TABS
2.5000 mg | ORAL_TABLET | Freq: Once | ORAL | Status: AC
  Administered 2013-05-26: 2.5 mg via ORAL
  Filled 2013-05-26: qty 1

## 2013-05-26 MED ORDER — INSULIN DETEMIR 100 UNIT/ML ~~LOC~~ SOLN
30.0000 [IU] | Freq: Every day | SUBCUTANEOUS | Status: DC
  Administered 2013-05-26: 30 [IU] via SUBCUTANEOUS
  Filled 2013-05-26 (×2): qty 0.3

## 2013-05-26 MED ORDER — FUROSEMIDE 10 MG/ML IJ SOLN
60.0000 mg | Freq: Three times a day (TID) | INTRAMUSCULAR | Status: DC
  Administered 2013-05-26 (×3): 60 mg via INTRAVENOUS
  Filled 2013-05-26 (×5): qty 6

## 2013-05-26 MED ORDER — GLUCOSE 40 % PO GEL
ORAL | Status: AC
  Administered 2013-05-26: 1
  Filled 2013-05-26: qty 1

## 2013-05-26 MED ORDER — INSULIN DETEMIR 100 UNIT/ML ~~LOC~~ SOLN
30.0000 [IU] | Freq: Every day | SUBCUTANEOUS | Status: DC
  Filled 2013-05-26: qty 0.3

## 2013-05-26 MED ORDER — INSULIN ASPART PROT & ASPART (70-30 MIX) 100 UNIT/ML ~~LOC~~ SUSP
25.0000 [IU] | Freq: Every day | SUBCUTANEOUS | Status: DC
  Filled 2013-05-26: qty 10

## 2013-05-26 MED ORDER — METOLAZONE 2.5 MG PO TABS
2.5000 mg | ORAL_TABLET | Freq: Once | ORAL | Status: AC
  Administered 2013-05-26: 2.5 mg via ORAL
  Filled 2013-05-26 (×2): qty 1

## 2013-05-26 MED ORDER — INSULIN DETEMIR 100 UNIT/ML ~~LOC~~ SOLN: 30.0000 [IU] | Freq: Every day | SUBCUTANEOUS | Status: DC

## 2013-05-26 NOTE — Progress Notes (Signed)
Occupational Therapy Treatment Patient Details Name: Destiny Day MRN: [id number] DOB: September 09, 1950 Today's Date: 05/26/2013 Time: 1010-1039 OT Time Calculation (min): 29 min  OT Assessment / Plan / Recommendation  History of present illness 62 y.o. female, with history of hypertension, type 2 diabetes mellitus, poor medication compliance, chronic diastolic CHF, asthma, dyslipidemia, and arthritis who presented to the hospital with a chief complaints of shortness of breath.  In the ER she was found to be in hypertensive crisis with blood pressure 220/115.  Per husband patient is not compliant with her medications   OT comments  Pt making progress with functional goals and should continue with acute OT services to help restore PLOF to return home safely. Pt became nauseous while seated in recliner during bathing activities and requested crackers to eat  Follow Up Recommendations  Home health OT;Supervision/Assistance - 24 hour    Barriers to Discharge   None    Equipment Recommendations  Tub/shower bench    Recommendations for Other Services    Frequency Min 2X/week   Progress towards OT Goals Progress towards OT goals: Progressing toward goals  Plan Discharge plan remains appropriate    Precautions / Restrictions Precautions Precautions: Fall Restrictions Weight Bearing Restrictions: No   Pertinent Vitals/Pain No c/o pain    ADL  Grooming: Wash/dry hands;Wash/dry face;Teeth care;Min guard Lower Body Bathing: Performed;Min guard Where Assessed - Lower Body Bathing: Supported sit to stand;Unsupported sitting Upper Body Dressing: Performed;Min guard Where Assessed - Upper Body Dressing: Unsupported standing Toilet Transfer: Performed;Min guard Toilet Transfer Method: Sit to Loss adjuster, chartered: Regular height toilet;Grab bars;Bedside commode Toileting - Clothing Manipulation and Hygiene: Performed;Min guard Where Assessed - Camera operator Manipulation and  Hygiene: Standing Tub/Shower Transfer Method: Not assessed Transfers/Ambulation Related to ADLs: assist to steady, pt guarded and moving at slow pace requiring increased time to complete ADL/ADL mobility tasks ADL Comments: pt began to feel nauseous during bathing while seated in recliner    OT Diagnosis:    OT Problem List:   OT Treatment Interventions:     OT Goals(current goals can now be found in the care plan section)    Visit Information  Last OT Received On: 05/26/13 Assistance Needed: +1 History of Present Illness: 62 y.o. female, with history of hypertension, type 2 diabetes mellitus, poor medication compliance, chronic diastolic CHF, asthma, dyslipidemia, and arthritis who presented to the hospital with a chief complaints of shortness of breath.  In the ER she was found to be in hypertensive crisis with blood pressure 220/115.  Per husband patient is not compliant with her medications    Subjective Data      Prior Functioning       Cognition  Cognition Arousal/Alertness: Awake/alert Behavior During Therapy: WFL for tasks assessed/performed Overall Cognitive Status: Within Functional Limits for tasks assessed    Mobility  Bed Mobility Bed Mobility: Not assessed Transfers Transfers: Sit to Stand;Stand to Sit Sit to Stand: 4: Min guard;From chair/3-in-1;From toilet;With upper extremity assist Stand to Sit: 4: Min guard;To chair/3-in-1;To toilet;Without upper extremity assist Details for Transfer Assistance: assist to steady, pt guarded and moving at slow pace    Exercises      Balance Balance Balance Assessed: Yes Dynamic Sitting Balance Dynamic Sitting - Balance Support: No upper extremity supported;Feet unsupported;During functional activity Dynamic Sitting - Level of Assistance: 5: Stand by assistance Dynamic Standing Balance Dynamic Standing - Balance Support: No upper extremity supported;During functional activity Dynamic Standing - Level of Assistance: 5:  Stand by  assistance;Other (comment) (min guard A)   End of Session OT - End of Session Equipment Utilized During Treatment: Rolling walker Activity Tolerance: Patient limited by fatigue;Other (comment) (pt became nauseous) Patient left: in chair;with call bell/phone within reach;with nursing/sitter in room  GO     Britt Bottom 05/26/2013, 12:03 PM

## 2013-05-26 NOTE — Plan of Care (Signed)
Problem: Phase I Progression Outcomes Goal: OOB as tolerated unless otherwise ordered Outcome: Completed/Met Date Met:  05/26/13 Pt OOB to chair walking in hallway with walker and husband. Goal: Initial discharge plan identified Outcome: Completed/Met Date Met:  05/26/13 Pt to return home with husband at discharge

## 2013-05-26 NOTE — Care Management Note (Addendum)
  Page 2 of 2   05/26/2013     11:10:47 AM   CARE MANAGEMENT NOTE 05/26/2013  Patient:  Destiny Day,Destiny Day   Account Number:  [id number]  Date Initiated:  05/26/2013  Documentation initiated by:  [facility]  Subjective/Objective Assessment:   62 y.o. female, hx of HTN ROM Day type 2 DM, poor medication compliance, chronic diastolic CHF, asthma, dyslipidemia, arthritis presents to the hospital with chief complaints of SOB, in the ER HTN crisis 220/115. / HM with spouse     Action/Plan:   IV antihypertensives; IV diuretic; CPAP//hm with HHPT; benefits check for Bidil   Anticipated DC Date:  05/27/2013   Anticipated DC Plan:  [place]  CM consult      [facility] Choice  HOME HEALTH   Choice offered to / List presented to:  C-1 Patient        [place] arranged  HH-2 PT      [facility]   Status of service:  Completed, signed off Medicare Important Message given?   (If response is "NO", the following Medicare IM given date fields will be blank) Date Medicare IM given:   Date Additional Medicare IM given:    Discharge Disposition:    Per UR Regulation:    If discussed at Long Length of Stay Meetings, dates discussed:    Comments:  05/26/13 [place], RN, BSN, [employer] [phone] Spoke with pt at bedside regarding discharge planning for [facility]. Offered pt list of home health agencies to choose from.  Pt chose [facility] to render services of PT. Debbie  of [place] notified.  No DME needs identified at this time.   Benefits check for Bidil are as followed: [employer] @ [employer] # [phone] COVERED-YES PRIOR APPROVAL - NO 30 DAY SUPPLY $ 64.00 PHARMACY:  [facility], [facility] , [facility] [address]. Sharene Skeans notified.

## 2013-05-26 NOTE — Progress Notes (Signed)
Report given to receiving RN. Patient is stable with no verbal complaints and no signs or symptoms of distress or discomfort.

## 2013-05-26 NOTE — Progress Notes (Signed)
Physical Therapy Treatment Patient Details Name: Destiny Day MRN: [id number] DOB: 1950-11-29 Today's Date: 05/26/2013 Time: 0950-1015 PT Time Calculation (min): 25 min  PT Assessment / Plan / Recommendation  History of Present Illness 62 y.o. female, with history of hypertension, type 2 diabetes mellitus, poor medication compliance, chronic diastolic CHF, asthma, dyslipidemia, and arthritis who presented to the hospital with a chief complaints of shortness of breath.  In the ER she was found to be in hypertensive crisis with blood pressure 220/115.  Per husband patient is not compliant with her medications   PT Comments   Pt admitted with above. Pt currently with functional limitations due to balance and endurance deficits.  Pt will benefit from skilled PT to increase their independence and safety with mobility to allow discharge to the venue listed below.   Follow Up Recommendations  Home health PT;Supervision - Intermittent                 Equipment Recommendations  Rolling walker with 5" wheels        Frequency Min 3X/week   Progress towards PT Goals Progress towards PT goals: Progressing toward goals  Plan Current plan remains appropriate    Precautions / Restrictions Precautions Precautions: Fall Restrictions Weight Bearing Restrictions: No   Pertinent Vitals/Pain VSS, no pain    Mobility  Bed Mobility Bed Mobility: Rolling Right;Right Sidelying to Sit;Sitting - Scoot to Edge of Bed Rolling Right: 5: Supervision Right Sidelying to Sit: 5: Supervision Sitting - Scoot to Edge of Bed: 5: Supervision Transfers Transfers: Sit to Stand;Stand to Sit Sit to Stand: 4: Min guard;From chair/3-in-1;From toilet;With upper extremity assist Stand to Sit: 4: Min guard;To chair/3-in-1;Without upper extremity assist Details for Transfer Assistance: assist to steady, pt guarded and moving at slow pace Ambulation/Gait Ambulation/Gait Assistance: 4: Min guard Ambulation  Distance (Feet): 400 Feet Assistive device: Rolling walker Ambulation/Gait Assistance Details: Pt with good safety overall with RW.  Good technique.  Slow and cautious.   Gait Pattern: Step-through pattern;Decreased stride length Stairs: No Wheelchair Mobility Wheelchair Mobility: No    Exercises General Exercises - Lower Extremity Ankle Circles/Pumps: AROM;Both;10 reps;Seated Long Arc Quad: AROM;Both;10 reps;Seated Hip Flexion/Marching: AROM;Both;10 reps;Seated   PT Goals (current goals can now be found in the care plan section)    Visit Information  Last PT Received On: 05/26/13 Assistance Needed: +1 History of Present Illness: 62 y.o. female, with history of hypertension, type 2 diabetes mellitus, poor medication compliance, chronic diastolic CHF, asthma, dyslipidemia, and arthritis who presented to the hospital with a chief complaints of shortness of breath.  In the ER she was found to be in hypertensive crisis with blood pressure 220/115.  Per husband patient is not compliant with her medications    Subjective Data  Subjective: "I need tobe up more.  It's so tiring.":   Cognition  Cognition Arousal/Alertness: Awake/alert Behavior During Therapy: WFL for tasks assessed/performed Overall Cognitive Status: Within Functional Limits for tasks assessed    Balance  Balance Balance Assessed: Yes Dynamic Sitting Balance Dynamic Sitting - Balance Support: No upper extremity supported;Feet unsupported;During functional activity Dynamic Sitting - Level of Assistance: 5: Stand by assistance Dynamic Standing Balance Dynamic Standing - Balance Support: No upper extremity supported;During functional activity Dynamic Standing - Level of Assistance: 5: Stand by assistance;Other (comment) (min guard A)  End of Session PT - End of Session Equipment Utilized During Treatment: Gait belt Activity Tolerance: Patient tolerated treatment well Patient left: in chair;with call bell/phone within  reach  Nurse Communication: Mobility status        INGOLD,Alysse Rathe 05/26/2013, 12:54 PM  [facility] [phone] (pager)

## 2013-05-26 NOTE — Progress Notes (Signed)
[facility] PROGRESS NOTE  Assessment/Plan: Hypertensive heart disease/*Malignant hypertension - BP stable, elevation likely due to non compliance. - On norvasc, valsartan, coreg, lasix, add Bidil.  - Moderate UOP on current dose. - I think her Cr will continue to rise as I think it is hemodiluted. She still has JVD and lower extremity edema. - Estimated dry weight is 140 lb. Now 161 lb.  Diastolic CHF, acute on chronic - Increase IV  lasix, max dose ARB and coreg still her BP is High - cont Bidil small dose zaroxolyn.  Acute respiratory failure due to  Pulmonary edema - Resolved due to non complains wit lasix. - sat >90% on RA.  Diabetic nephropathy/CKD stage 3 - Creat 1.6 - i think her Cr. Is hemodiluted as the pt becomes evulosemic. Her creatinine will increase.  DIABETES MELLITUS, TYPE II - poorly controlled. - increase 70/30.   HYPERLIPIDEMIA - cont statins    Code Status: full Family Communication: none  Disposition Plan: inaptient   Consultants:  none  Procedures:  ECho pending  Antibiotics:  none  HPI/Subjective: Relates her SOB is improved.  Objective: Filed Vitals:   05/26/13 0927 05/26/13 0931 05/26/13 0933 05/26/13 0938  BP: 132/49 143/57 135/56 146/61  Pulse: 70 73 74 77  Temp:      TempSrc:      Resp:      Height:      Weight:      SpO2:        Intake/Output Summary (Last 24 hours) at 05/26/13 1021 Last data filed at 05/26/13 0930  Gross per 24 hour  Intake   1203 ml  Output   1600 ml  Net   -397 ml   Filed Weights   05/24/13 1622 05/25/13 0523 05/26/13 0437  Weight: 72.303 kg (159 lb 6.4 oz) 72.848 kg (160 lb 9.6 oz) 73.12 kg (161 lb 3.2 oz)    Exam:  General: Alert, awake, oriented x3, in no acute distress.  HEENT: No bruits, no goiter. +JVD Heart: Regular rate and rhythm, lower extremity edema + 2. Lungs: Good air movement, bilateral air movement.  Abdomen: Soft, nontender, nondistended, positive bowel sounds.   Neuro: Grossly intact, nonfocal.   Data Reviewed: Basic Metabolic Panel:  Recent Labs Lab 05/23/13 0540 05/23/13 0852 05/24/13 0503 05/25/13 0516  NA 135  --  134* 137  K 3.5  --  3.5 4.0  CL 97  --  99 98  CO2 21  --  26 27  GLUCOSE 350*  --  55* 198*  BUN 18  --  26* 32*  CREATININE 1.56* 1.62* 1.94* 1.86*  CALCIUM 8.6  --  8.4 8.8  MG  --   --  1.8  --   PHOS  --   --  3.7  --    Liver Function Tests: No results found for this basename: AST, ALT, ALKPHOS, BILITOT, PROT, ALBUMIN,  in the last 168 hours No results found for this basename: LIPASE, AMYLASE,  in the last 168 hours No results found for this basename: AMMONIA,  in the last 168 hours CBC:  Recent Labs Lab 05/23/13 0540 05/23/13 1215 05/24/13 0503 05/25/13 0516  WBC 14.2* 11.6* 9.8 8.6  NEUTROABS 6.5  --   --   --   HGB 10.7* 9.7* 9.1* 9.7*  HCT 32.7* 28.6* 26.9* 30.2*  MCV 88.9 86.4 87.1 87.5  PLT 425* 349 336 360   Cardiac Enzymes:  Recent Labs Lab 05/23/13 0540 05/23/13 0852 05/23/13  1515 05/23/13 2045  TROPONINI <0.30 <0.30 <0.30 <0.30   BNP (last 3 results)  Recent Labs  08/23/12 2335 05/23/13 0540  PROBNP 7978.0* 12897.0*   CBG:  Recent Labs Lab 05/25/13 0614 05/25/13 1111 05/25/13 1634 05/25/13 2125 05/26/13 0548  GLUCAP 228* 130* 113* 138* 134*    Recent Results (from the past 240 hour(s))  MRSA PCR SCREENING     Status: None   Collection Time    05/23/13 11:46 AM      Result Value Range Status   MRSA by PCR NEGATIVE  NEGATIVE Final   Comment:            The GeneXpert MRSA Assay (FDA     approved for NASAL specimens     only), is one component of a     comprehensive MRSA colonization     surveillance program. It is not     intended to diagnose MRSA     infection nor to guide or     monitor treatment for     MRSA infections.     Studies: No results found.  Scheduled Meds: . amLODipine  10 mg Oral Daily   And  . valsartan  320 mg Oral Daily  . aspirin   81 mg Oral Daily  . atorvastatin  20 mg Oral q1800  . carvedilol  25 mg Oral BID WC  . ferrous sulfate  325 mg Oral Q breakfast  . furosemide  60 mg Intravenous TID  . heparin  5,000 Units Subcutaneous Q8H  . insulin aspart  0-9 Units Subcutaneous TID WC  . insulin aspart protamine- aspart  10 Units Subcutaneous Q supper  . insulin aspart protamine- aspart  22 Units Subcutaneous Q breakfast  . isosorbide-hydrALAZINE  1 tablet Oral BID  . sodium chloride  3 mL Intravenous Q12H   Continuous Infusions:    Charlynne Cousins  [facility] Pager [phone]. If 8PM-8AM, please contact night-coverage at [email or web address], password [facility] 05/26/2013, 10:21 AM  LOS: 3 days

## 2013-05-26 NOTE — Progress Notes (Signed)
CRITICAL VALUE ALERT  Critical value received: CBG=53  Date of notification:  05-26-13  Time of notification:  1208PM  Critical value read back:yes  Nurse who received alert: Lonzo Cloud RN  MD notified (1st page):  Feliz-Ortiz  Time of first page:  Verbally at 1208  MD notified (2nd page):  Time of second page:  Responding MD:  Aileen Fass  Time MD responded: 1208  New orders given.

## 2013-05-27 DIAGNOSIS — I119 Hypertensive heart disease without heart failure: Secondary | ICD-10-CM

## 2013-05-27 LAB — GLUCOSE, CAPILLARY
Glucose-Capillary: 127 mg/dL — ABNORMAL HIGH (ref 70–99)
Glucose-Capillary: 122 mg/dL — ABNORMAL HIGH (ref 70–99)
Glucose-Capillary: 142 mg/dL — ABNORMAL HIGH (ref 70–99)
Glucose-Capillary: 274 mg/dL — ABNORMAL HIGH (ref 70–99)

## 2013-05-27 LAB — BASIC METABOLIC PANEL
BUN: 40 mg/dL — ABNORMAL HIGH (ref 6–23)
CO2: 30 mEq/L (ref 19–32)
Calcium: 8.8 mg/dL (ref 8.4–10.5)
Chloride: 94 mEq/L — ABNORMAL LOW (ref 96–112)
Creatinine, Ser: 2.07 mg/dL — ABNORMAL HIGH (ref 0.50–1.10)
GFR calc Af Amer: 28 mL/min — ABNORMAL LOW (ref >90)
GFR calc non Af Amer: 25 mL/min — ABNORMAL LOW (ref >90)
Glucose, Bld: 134 mg/dL — ABNORMAL HIGH (ref 70–99)
Potassium: 3.8 mEq/L (ref 3.5–5.1)
Sodium: 135 mEq/L (ref 135–145)

## 2013-05-27 MED ORDER — POTASSIUM CHLORIDE CRYS ER 20 MEQ PO TBCR
20.0000 meq | EXTENDED_RELEASE_TABLET | Freq: Two times a day (BID) | ORAL | Status: AC
  Administered 2013-05-27 (×2): 20 meq via ORAL
  Filled 2013-05-27 (×2): qty 1

## 2013-05-27 MED ORDER — METOLAZONE 2.5 MG PO TABS
2.5000 mg | ORAL_TABLET | Freq: Two times a day (BID) | ORAL | Status: DC
  Administered 2013-05-27: 2.5 mg via ORAL
  Filled 2013-05-27 (×2): qty 1

## 2013-05-27 MED ORDER — INSULIN DETEMIR 100 UNIT/ML ~~LOC~~ SOLN
32.0000 [IU] | Freq: Every day | SUBCUTANEOUS | Status: DC
  Administered 2013-05-27: 32 [IU] via SUBCUTANEOUS
  Filled 2013-05-27: qty 0.32

## 2013-05-27 MED ORDER — METOLAZONE 2.5 MG PO TABS
2.5000 mg | ORAL_TABLET | Freq: Every day | ORAL | Status: DC
  Administered 2013-05-28: 2.5 mg via ORAL
  Filled 2013-05-27 (×3): qty 1

## 2013-05-27 MED ORDER — FUROSEMIDE 10 MG/ML IJ SOLN
8.0000 mg/h | INTRAVENOUS | Status: DC
  Administered 2013-05-27: 10 mg/h via INTRAVENOUS
  Administered 2013-05-28 – 2013-05-30 (×3): 8 mg/h via INTRAVENOUS
  Filled 2013-05-27 (×6): qty 25

## 2013-05-27 NOTE — Progress Notes (Signed)
Blood pressure 180/90 manually. PRN Hydralazine given. Will recheck blood pressure in about an hour. Patient complains of no headache, pain, or anxiety. Patient stated she was up walking with walker in hallway about 60minutes prior to blood pressure being taken. Will continue to monitor to end of shift and will recheck blood pressure in about an hour.

## 2013-05-27 NOTE — Progress Notes (Addendum)
[facility] PROGRESS NOTE  Assessment/Plan: Diastolic CHF, acute on chronic - Change to lasix drip and metolazone. - max dose ARB and coreg still her BP is High - cont Bidil small. - Moderate UOP on current dose. Still overloaded, + edema with + JVD. - Estimated dry weight is 140 lb. Now 161 lb.  Hypertensive heart disease/*Malignant hypertension - BP stable, elevation likely due to non compliance. - On norvasc, valsartan, coreg,  Lasix and Bidil.   Acute respiratory failure due to  Pulmonary edema - Resolved. - sat >90% on RA.  Diabetic nephropathy/CKD stage 3 - Creat 1.6 - i think her Cr. Is hemodiluted as the pt becomes evulosemic. Her creatinine will increase.  DIABETES MELLITUS, TYPE II - poorly controlled. - increase 70/30.   HYPERLIPIDEMIA - cont statins    Code Status: full Family Communication: none  Disposition Plan: inaptient   Consultants:  none  Procedures:  ECho pending  Antibiotics:  none  HPI/Subjective: Relates her SOB is improved.  Objective: Filed Vitals:   05/26/13 2125 05/27/13 0539 05/27/13 0543 05/27/13 0847  BP: 166/69 187/76 170/78 161/65  Pulse: 77 86  80  Temp: 98.1 F (36.7 C) 98.1 F (36.7 C)    TempSrc: Oral Oral    Resp: 16 18  20   Height:      Weight:  72.031 kg (158 lb 12.8 oz)    SpO2: 97% 95%  99%    Intake/Output Summary (Last 24 hours) at 05/27/13 1003 Last data filed at 05/27/13 0856  Gross per 24 hour  Intake    840 ml  Output   3400 ml  Net  -2560 ml   Filed Weights   05/25/13 0523 05/26/13 0437 05/27/13 0539  Weight: 72.848 kg (160 lb 9.6 oz) 73.12 kg (161 lb 3.2 oz) 72.031 kg (158 lb 12.8 oz)    Exam:  General: Alert, awake, oriented x3, in no acute distress.  HEENT: No bruits, no goiter. +JVD Heart: Regular rate and rhythm, lower extremity edema + 2. Lungs: Good air movement, bilateral air movement.  Abdomen: Soft, nontender, nondistended, positive bowel sounds.  Neuro: Grossly  intact, nonfocal.   Data Reviewed: Basic Metabolic Panel:  Recent Labs Lab 05/23/13 0540 05/23/13 0852 05/24/13 0503 05/25/13 0516  NA 135  --  134* 137  K 3.5  --  3.5 4.0  CL 97  --  99 98  CO2 21  --  26 27  GLUCOSE 350*  --  55* 198*  BUN 18  --  26* 32*  CREATININE 1.56* 1.62* 1.94* 1.86*  CALCIUM 8.6  --  8.4 8.8  MG  --   --  1.8  --   PHOS  --   --  3.7  --    Liver Function Tests: No results found for this basename: AST, ALT, ALKPHOS, BILITOT, PROT, ALBUMIN,  in the last 168 hours No results found for this basename: LIPASE, AMYLASE,  in the last 168 hours No results found for this basename: AMMONIA,  in the last 168 hours CBC:  Recent Labs Lab 05/23/13 0540 05/23/13 1215 05/24/13 0503 05/25/13 0516  WBC 14.2* 11.6* 9.8 8.6  NEUTROABS 6.5  --   --   --   HGB 10.7* 9.7* 9.1* 9.7*  HCT 32.7* 28.6* 26.9* 30.2*  MCV 88.9 86.4 87.1 87.5  PLT 425* 349 336 360   Cardiac Enzymes:  Recent Labs Lab 05/23/13 0540 05/23/13 0852 05/23/13 1515 05/23/13 2045  TROPONINI <0.30 <0.30 <0.30 <0.30  BNP (last 3 results)  Recent Labs  08/23/12 2335 05/23/13 0540  PROBNP 7978.0* 12897.0*   CBG:  Recent Labs Lab 05/26/13 1339 05/26/13 1644 05/26/13 1918 05/26/13 2143 05/27/13 0602  GLUCAP 124* 243* 198* 157* 127*    Recent Results (from the past 240 hour(s))  MRSA PCR SCREENING     Status: None   Collection Time    05/23/13 11:46 AM      Result Value Range Status   MRSA by PCR NEGATIVE  NEGATIVE Final   Comment:            The GeneXpert MRSA Assay (FDA     approved for NASAL specimens     only), is one component of a     comprehensive MRSA colonization     surveillance program. It is not     intended to diagnose MRSA     infection nor to guide or     monitor treatment for     MRSA infections.     Studies: No results found.  Scheduled Meds: . amLODipine  10 mg Oral Daily   And  . valsartan  320 mg Oral Daily  . aspirin  81 mg Oral Daily   . atorvastatin  20 mg Oral q1800  . carvedilol  25 mg Oral BID WC  . ferrous sulfate  325 mg Oral Q breakfast  . furosemide  60 mg Intravenous TID  . heparin  5,000 Units Subcutaneous Q8H  . insulin aspart  0-9 Units Subcutaneous TID WC  . insulin detemir  30 Units Subcutaneous QHS  . isosorbide-hydrALAZINE  1 tablet Oral BID  . metolazone  2.5 mg Oral BID  . potassium chloride  20 mEq Oral BID  . sodium chloride  3 mL Intravenous Q12H   Continuous Infusions:    Charlynne Cousins  [facility] Pager [phone]. If 8PM-8AM, please contact night-coverage at [email or web address], password [facility] 05/27/2013, 10:03 AM  LOS: 4 days

## 2013-05-27 NOTE — Progress Notes (Signed)
Rechecked blood pressure and has decreased to 153/65. Notified oncoming nurse of this issue of having to give PRN hydralazine to help lower blood pressure. Will continue to monitor to end of shift.

## 2013-05-28 LAB — BASIC METABOLIC PANEL
BUN: 44 mg/dL — ABNORMAL HIGH (ref 6–23)
CO2: 30 mEq/L (ref 19–32)
Calcium: 8.9 mg/dL (ref 8.4–10.5)
Chloride: 96 mEq/L (ref 96–112)
Creatinine, Ser: 2.03 mg/dL — ABNORMAL HIGH (ref 0.50–1.10)
GFR calc Af Amer: 29 mL/min — ABNORMAL LOW (ref >90)
GFR calc non Af Amer: 25 mL/min — ABNORMAL LOW (ref >90)
Glucose, Bld: 94 mg/dL (ref 70–99)
Potassium: 3.5 mEq/L (ref 3.5–5.1)
Sodium: 137 mEq/L (ref 135–145)

## 2013-05-28 LAB — GLUCOSE, CAPILLARY
Glucose-Capillary: 110 mg/dL — ABNORMAL HIGH (ref 70–99)
Glucose-Capillary: 53 mg/dL — ABNORMAL LOW (ref 70–99)
Glucose-Capillary: 266 mg/dL — ABNORMAL HIGH (ref 70–99)
Glucose-Capillary: 93 mg/dL (ref 70–99)
Glucose-Capillary: 231 mg/dL — ABNORMAL HIGH (ref 70–99)

## 2013-05-28 MED ORDER — INSULIN DETEMIR 100 UNIT/ML ~~LOC~~ SOLN
28.0000 [IU] | Freq: Every day | SUBCUTANEOUS | Status: DC
  Administered 2013-05-28 – 2013-05-30 (×3): 28 [IU] via SUBCUTANEOUS
  Filled 2013-05-28 (×3): qty 0.28

## 2013-05-28 MED ORDER — ISOSORB DINITRATE-HYDRALAZINE 20-37.5 MG PO TABS
2.0000 | ORAL_TABLET | Freq: Two times a day (BID) | ORAL | Status: DC
  Administered 2013-05-28 – 2013-06-01 (×9): 2 via ORAL
  Filled 2013-05-28 (×10): qty 2

## 2013-05-28 MED ORDER — INSULIN ASPART 100 UNIT/ML ~~LOC~~ SOLN
0.0000 [IU] | Freq: Three times a day (TID) | SUBCUTANEOUS | Status: DC
  Administered 2013-05-28: 5 [IU] via SUBCUTANEOUS
  Administered 2013-05-29: 3 [IU] via SUBCUTANEOUS
  Administered 2013-05-30 (×2): 2 [IU] via SUBCUTANEOUS
  Administered 2013-05-31: 3 [IU] via SUBCUTANEOUS
  Administered 2013-05-31: 1 [IU] via SUBCUTANEOUS
  Administered 2013-06-01: 2 [IU] via SUBCUTANEOUS
  Administered 2013-06-01: 5 [IU] via SUBCUTANEOUS

## 2013-05-28 MED ORDER — INSULIN ASPART 100 UNIT/ML ~~LOC~~ SOLN
3.0000 [IU] | Freq: Three times a day (TID) | SUBCUTANEOUS | Status: DC
  Administered 2013-05-28 – 2013-05-31 (×8): 3 [IU] via SUBCUTANEOUS
  Administered 2013-05-31: 13:00:00 via SUBCUTANEOUS
  Administered 2013-06-01 (×2): 3 [IU] via SUBCUTANEOUS

## 2013-05-28 MED ORDER — GLUCOSE 40 % PO GEL
ORAL | Status: AC
  Administered 2013-05-28: 37.5 g
  Filled 2013-05-28: qty 1

## 2013-05-28 MED ORDER — INSULIN ASPART 100 UNIT/ML ~~LOC~~ SOLN
0.0000 [IU] | Freq: Every day | SUBCUTANEOUS | Status: DC
  Administered 2013-05-28 – 2013-05-30 (×2): 2 [IU] via SUBCUTANEOUS

## 2013-05-28 NOTE — Progress Notes (Signed)
Hypoglycemic Event  CBG: 53  Treatment: oral glucose  Symptoms: weakness, dizziness  Follow-up CBG: Time:0605 CBG Result 110  Possible Reasons for Event: Lantus 32 units given last night  Comments/MD notified:hypoglycemic order set initiated    Vernon Prey  Remember to initiate Hypoglycemia Order Set & complete

## 2013-05-28 NOTE — Progress Notes (Signed)
[facility] PROGRESS NOTE  Assessment/Plan: Diastolic CHF, acute on chronic - Cont lasix drip and metolazone. - max dose ARB and coreg, still her BP is High - increase Bidil. - Good UOP on current dose. Still overloaded, + edema with + JVD. - Estimated dry weight is 115 lb.   Hypertensive heart disease/*Malignant hypertension - BP stable, elevation likely due to non compliance. - D/cnorvasc, valsartan, coreg,  Lasix and increase Bidil.   Acute respiratory failure due to  Pulmonary edema - Resolved. - sat >90% on RA.  Diabetic nephropathy/CKD stage 3 - Creat 1.6 - I think her Cr. Is hemodiluted as the pt becomes evulosemic. Her creatinine will increase.  DIABETES MELLITUS, TYPE II - poorly controlled. - change to lantus.   HYPERLIPIDEMIA - cont statins    Code Status: full Family Communication: none  Disposition Plan: inaptient   Consultants:  none  Procedures:  ECho pending  Antibiotics:  none  HPI/Subjective: No SOB  Objective: Filed Vitals:   05/27/13 1755 05/27/13 1946 05/27/13 2143 05/28/13 0632  BP: 180/90 153/65 146/55 143/62  Pulse:  79 82 77  Temp:   98.3 F (36.8 C) 98.1 F (36.7 C)  TempSrc:   Oral Oral  Resp:   18 18  Height:      Weight:    72.1 kg (158 lb 15.2 oz)  SpO2:   97% 99%    Intake/Output Summary (Last 24 hours) at 05/28/13 0835 Last data filed at 05/28/13 0600  Gross per 24 hour  Intake 683.63 ml  Output   2950 ml  Net -2266.37 ml   Filed Weights   05/26/13 0437 05/27/13 0539 05/28/13 [id number]  Weight: 73.12 kg (161 lb 3.2 oz) 72.031 kg (158 lb 12.8 oz) 72.1 kg (158 lb 15.2 oz)    Exam:  General: Alert, awake, oriented x3, in no acute distress.  HEENT: No bruits, no goiter. No JVD, able to lay flat Heart: Regular rate and rhythm, lower extremity edema + 1. Lungs: Good air movement, bilateral air movement.  Abdomen: Soft, nontender, nondistended, positive bowel sounds.  Neuro: Grossly intact,  nonfocal.   Data Reviewed: Basic Metabolic Panel:  Recent Labs Lab 05/23/13 0540 05/23/13 0852 05/24/13 0503 05/25/13 0516 05/27/13 1020 05/28/13 0625  NA 135  --  134* 137 135 137  K 3.5  --  3.5 4.0 3.8 3.5  CL 97  --  99 98 94* 96  CO2 21  --  26 27 30 30   GLUCOSE 350*  --  55* 198* 134* 94  BUN 18  --  26* 32* 40* 44*  CREATININE 1.56* 1.62* 1.94* 1.86* 2.07* 2.03*  CALCIUM 8.6  --  8.4 8.8 8.8 8.9  MG  --   --  1.8  --   --   --   PHOS  --   --  3.7  --   --   --    Liver Function Tests: No results found for this basename: AST, ALT, ALKPHOS, BILITOT, PROT, ALBUMIN,  in the last 168 hours No results found for this basename: LIPASE, AMYLASE,  in the last 168 hours No results found for this basename: AMMONIA,  in the last 168 hours CBC:  Recent Labs Lab 05/23/13 0540 05/23/13 1215 05/24/13 0503 05/25/13 0516  WBC 14.2* 11.6* 9.8 8.6  NEUTROABS 6.5  --   --   --   HGB 10.7* 9.7* 9.1* 9.7*  HCT 32.7* 28.6* 26.9* 30.2*  MCV 88.9 86.4 87.1 87.5  PLT 425* 349 336 360   Cardiac Enzymes:  Recent Labs Lab 05/23/13 0540 05/23/13 0852 05/23/13 1515 05/23/13 2045  TROPONINI <0.30 <0.30 <0.30 <0.30   BNP (last 3 results)  Recent Labs  08/23/12 2335 05/23/13 0540  PROBNP 7978.0* 12897.0*   CBG:  Recent Labs Lab 05/27/13 1130 05/27/13 1606 05/27/13 2127 05/28/13 0542 05/28/13 0609  GLUCAP 122* 142* 274* 53* 110*    Recent Results (from the past 240 hour(s))  MRSA PCR SCREENING     Status: None   Collection Time    05/23/13 11:46 AM      Result Value Range Status   MRSA by PCR NEGATIVE  NEGATIVE Final   Comment:            The GeneXpert MRSA Assay (FDA     approved for NASAL specimens     only), is one component of a     comprehensive MRSA colonization     surveillance program. It is not     intended to diagnose MRSA     infection nor to guide or     monitor treatment for     MRSA infections.     Studies: No results found.  Scheduled  Meds: . aspirin  81 mg Oral Daily  . atorvastatin  20 mg Oral q1800  . carvedilol  25 mg Oral BID WC  . ferrous sulfate  325 mg Oral Q breakfast  . heparin  5,000 Units Subcutaneous Q8H  . insulin aspart  0-5 Units Subcutaneous QHS  . insulin aspart  0-9 Units Subcutaneous TID WC  . insulin aspart  3 Units Subcutaneous TID WC  . insulin detemir  28 Units Subcutaneous QHS  . isosorbide-hydrALAZINE  1 tablet Oral BID  . metolazone  2.5 mg Oral Daily  . sodium chloride  3 mL Intravenous Q12H  . valsartan  320 mg Oral Daily   Continuous Infusions: . furosemide (LASIX) infusion 4 mg/hr (05/27/13 1558)     Venetia Constable Marguarite Arbour  [facility] Pager [phone]. If 8PM-8AM, please contact night-coverage at [email or web address], password [facility] 05/28/2013, 8:35 AM  LOS: 5 days

## 2013-05-29 DIAGNOSIS — N058 Unspecified nephritic syndrome with other morphologic changes: Secondary | ICD-10-CM

## 2013-05-29 DIAGNOSIS — E1129 Type 2 diabetes mellitus with other diabetic kidney complication: Secondary | ICD-10-CM

## 2013-05-29 LAB — GLUCOSE, CAPILLARY
Glucose-Capillary: 89 mg/dL (ref 70–99)
Glucose-Capillary: 120 mg/dL — ABNORMAL HIGH (ref 70–99)
Glucose-Capillary: 220 mg/dL — ABNORMAL HIGH (ref 70–99)
Glucose-Capillary: 125 mg/dL — ABNORMAL HIGH (ref 70–99)

## 2013-05-29 LAB — BASIC METABOLIC PANEL
BUN: 47 mg/dL — ABNORMAL HIGH (ref 6–23)
CO2: 29 mEq/L (ref 19–32)
Calcium: 9 mg/dL (ref 8.4–10.5)
Chloride: 91 mEq/L — ABNORMAL LOW (ref 96–112)
Creatinine, Ser: 1.93 mg/dL — ABNORMAL HIGH (ref 0.50–1.10)
GFR calc Af Amer: 31 mL/min — ABNORMAL LOW (ref >90)
GFR calc non Af Amer: 27 mL/min — ABNORMAL LOW (ref >90)
Glucose, Bld: 138 mg/dL — ABNORMAL HIGH (ref 70–99)
Potassium: 4.1 mEq/L (ref 3.5–5.1)
Sodium: 132 mEq/L — ABNORMAL LOW (ref 135–145)

## 2013-05-29 LAB — PRO B NATRIURETIC PEPTIDE: Pro B Natriuretic peptide (BNP): 3477 pg/mL — ABNORMAL HIGH (ref 0–125)

## 2013-05-29 MED ORDER — METOLAZONE 5 MG PO TABS: 5.0000 mg | ORAL_TABLET | Freq: Every day | ORAL | Status: DC

## 2013-05-29 MED ORDER — METOLAZONE 5 MG PO TABS
5.0000 mg | ORAL_TABLET | Freq: Two times a day (BID) | ORAL | Status: DC
  Administered 2013-05-29 (×2): 5 mg via ORAL
  Filled 2013-05-29 (×5): qty 1

## 2013-05-29 NOTE — Progress Notes (Signed)
Physical Therapy Treatment Patient Details Name: Destiny Day MRN: [id number] DOB: 20-Mar-1951 Today's Date: 05/29/2013 Time: [id number] PT Time Calculation (min): 15 min  PT Assessment / Plan / Recommendation  History of Present Illness 62 y.o. female, with history of hypertension, type 2 diabetes mellitus, poor medication compliance, chronic diastolic CHF, asthma, dyslipidemia, and arthritis who presented to the hospital with a chief complaints of shortness of breath.  In the ER she was found to be in hypertensive crisis with blood pressure 220/115.  Per husband patient is not compliant with her medications   PT Comments   Pt cont' to progress but remains to have decreased overall activity tolerance. Pt reports sister-in-law to be coming to assist pt at home. Pt progressing to supervision level but remains to depend on RW for safe ambulation. Acute PT to con't to follow to progress amb.   Follow Up Recommendations  Home health PT;Supervision - Intermittent     Does the patient have the potential to tolerate intense rehabilitation     Barriers to Discharge        Equipment Recommendations  Rolling walker with 5" wheels    Recommendations for Other Services    Frequency Min 3X/week   Progress towards PT Goals Progress towards PT goals: Progressing toward goals  Plan Current plan remains appropriate    Precautions / Restrictions Precautions Precautions: Fall   Pertinent Vitals/Pain Denies pain    Mobility  Bed Mobility Bed Mobility: Not assessed (sitting eob) Transfers Transfers: Sit to Stand;Stand to Sit Sit to Stand: 5: Supervision;With upper extremity assist;From chair/3-in-1 Stand to Sit: 5: Supervision;With upper extremity assist;To chair/3-in-1 Details for Transfer Assistance: pt with good technique Ambulation/Gait Ambulation/Gait Assistance: 5: Supervision Ambulation Distance (Feet): 120 Feet Assistive device: Rolling walker Ambulation/Gait Assistance Details:  slow/guarded, v/c's to stand more upright and not push walker to far in front. pt with increased bilat UE WBing, mild SOB Gait Pattern: Step-through pattern;Decreased stride length Gait velocity: slow/cautious General Gait Details: pt assisted to bathroom. pt used rail and was supervision with pericare and hand washing. v/c's for safe walker management Stairs: No    Exercises     PT Diagnosis:    PT Problem List:   PT Treatment Interventions:     PT Goals (current goals can now be found in the care plan section)    Visit Information  Last PT Received On: 05/29/13 Assistance Needed: +1 History of Present Illness: 62 y.o. female, with history of hypertension, type 2 diabetes mellitus, poor medication compliance, chronic diastolic CHF, asthma, dyslipidemia, and arthritis who presented to the hospital with a chief complaints of shortness of breath.  In the ER she was found to be in hypertensive crisis with blood pressure 220/115.  Per husband patient is not compliant with her medications    Subjective Data  Subjective: i need to go to the bathroom   Cognition  Cognition Arousal/Alertness: Awake/alert Behavior During Therapy: WFL for tasks assessed/performed Overall Cognitive Status: Within Functional Limits for tasks assessed    Balance     End of Session PT - End of Session Equipment Utilized During Treatment: Gait belt Activity Tolerance: Patient tolerated treatment well Patient left: with nursing/sitter in room;with family/visitor present (sitting EOB) Nurse Communication: Mobility status   GP     Destiny Day 05/29/2013, 5:08 PM  Destiny Day, PT, DPT Pager #: [phone] Office #: [phone]

## 2013-05-29 NOTE — Progress Notes (Signed)
Occupational Therapy Treatment Patient Details Name: Destiny Day MRN: [id number] DOB: 1951-06-06 Today's Date: 05/29/2013 Time: [id number] OT Time Calculation (min): 48 min  OT Assessment / Plan / Recommendation  History of present illness 62 y.o. female, with history of hypertension, type 2 diabetes mellitus, poor medication compliance, chronic diastolic CHF, asthma, dyslipidemia, and arthritis who presented to the hospital with a chief complaints of shortness of breath.  In the ER she was found to be in hypertensive crisis with blood pressure 220/115.  Per husband patient is not compliant with her medications   OT comments  Pt voicing many concerns about having to comply with diabetic diet. Improved mobility and increasing independence in mobility.  Pt continues to have decreased activity tolerance and balance deficits. Provided pt with handout with picture of tub transfer bench.    Follow Up Recommendations  Home health OT;Supervision/Assistance - 24 hour    Barriers to Discharge       Equipment Recommendations   (tub bench recommended, ??pt can afford)    Recommendations for Other Services    Frequency Min 2X/week   Progress towards OT Goals Progress towards OT goals: Progressing toward goals  Plan Discharge plan remains appropriate    Precautions / Restrictions Precautions Precautions: Fall   Pertinent Vitals/Pain VSS, no c/o pain    ADL  Grooming: Wash/dry hands;Wash/dry face;Teeth care;Supervision/safety Where Assessed - Grooming: Unsupported standing Toilet Transfer: Copy Method: Sit to Loss adjuster, chartered: Regular height toilet;Grab bars Toileting - Clothing Manipulation and Hygiene: Supervision/safety Where Assessed - Best boy and Hygiene: Standing Equipment Used: Rolling walker Transfers/Ambulation Related to ADLs: moves slowly, supervision for use of RW, but did not require physical assist ADL  Comments: pt with nausea with activity    OT Diagnosis:    OT Problem List:   OT Treatment Interventions:     OT Goals(current goals can now be found in the care plan section) Acute Rehab OT Goals Patient Stated Goal: home with husband  Visit Information  Last OT Received On: 05/29/13 Assistance Needed: +1 History of Present Illness: 62 y.o. female, with history of hypertension, type 2 diabetes mellitus, poor medication compliance, chronic diastolic CHF, asthma, dyslipidemia, and arthritis who presented to the hospital with a chief complaints of shortness of breath.  In the ER she was found to be in hypertensive crisis with blood pressure 220/115.  Per husband patient is not compliant with her medications    Subjective Data      Prior Functioning       Cognition  Cognition Arousal/Alertness: Awake/alert Behavior During Therapy: WFL for tasks assessed/performed Overall Cognitive Status: Within Functional Limits for tasks assessed    Mobility  Bed Mobility Bed Mobility: Not assessed (pt already up in chair) Transfers Transfers: Sit to Stand;Stand to Sit Sit to Stand: 5: Supervision;With upper extremity assist;From chair/3-in-1 Stand to Sit: 5: Supervision;With upper extremity assist;To chair/3-in-1    Exercises      Balance     End of Session OT - End of Session Activity Tolerance: Patient limited by fatigue Patient left: in chair;with call bell/phone within reach  GO     Destiny Day 05/29/2013, 12:57 PM [phone]

## 2013-05-29 NOTE — Progress Notes (Signed)
[facility] PROGRESS NOTE  Assessment/Plan: Diastolic CHF, acute on chronic - Cont lasix drip and metolazone. Still vol overloaded. - max dose ARB, bidil and coreg, still her BP is High - Good UOP on current dose.  - Estimated dry weight is 130 lb.   Hypertensive heart disease/*Malignant hypertension - BP stable, elevation likely due to non compliance. - D/cnorvasc, valsartan, coreg,  Lasix and increase Bidil.   Acute respiratory failure due to  Pulmonary edema - Resolved. - sat >90% on RA.  Diabetic nephropathy/CKD stage 3 - Creat 1.6 - I think her Cr. Is hemodiluted as the pt becomes evulosemic. Her creatinine will increase.  DIABETES MELLITUS, TYPE II - poorly controlled. - change to lantus.   HYPERLIPIDEMIA - cont statins    Code Status: full Family Communication: none  Disposition Plan: inaptient   Consultants:  none  Procedures: ECho 0000000: grade 2 diastolic dysfunction   Antibiotics:  none  HPI/Subjective: No SOB  Objective: Filed Vitals:   05/28/13 1217 05/28/13 1432 05/28/13 2209 05/29/13 0617  BP: 176/80 149/63 144/59 144/63  Pulse:  75 78 72  Temp:  97.4 F (36.3 C) 98.1 F (36.7 C) 97.6 F (36.4 C)  TempSrc:  Oral Oral Oral  Resp:  19 18 17   Height:      Weight:    71.4 kg (157 lb 6.5 oz)  SpO2:  100% 98% 100%    Intake/Output Summary (Last 24 hours) at 05/29/13 0843 Last data filed at 05/29/13 [id number]  Gross per 24 hour  Intake    240 ml  Output   3350 ml  Net  -3110 ml   Filed Weights   05/27/13 0539 05/28/13 0632 05/29/13 0617  Weight: 72.031 kg (158 lb 12.8 oz) 72.1 kg (158 lb 15.2 oz) 71.4 kg (157 lb 6.5 oz)    Exam:  General: Alert, awake, oriented x3, in no acute distress.  HEENT: No bruits, no goiter. No JVD, able to lay flat Heart: Regular rate and rhythm, lower extremity edema + 1. Lungs: Good air movement, bilateral air movement.  Abdomen: Soft, nontender, nondistended, positive bowel sounds.  Neuro:  Grossly intact, nonfocal.   Data Reviewed: Basic Metabolic Panel:  Recent Labs Lab 05/23/13 0540 05/23/13 0852 05/24/13 0503 05/25/13 0516 05/27/13 1020 05/28/13 0625  NA 135  --  134* 137 135 137  K 3.5  --  3.5 4.0 3.8 3.5  CL 97  --  99 98 94* 96  CO2 21  --  26 27 30 30   GLUCOSE 350*  --  55* 198* 134* 94  BUN 18  --  26* 32* 40* 44*  CREATININE 1.56* 1.62* 1.94* 1.86* 2.07* 2.03*  CALCIUM 8.6  --  8.4 8.8 8.8 8.9  MG  --   --  1.8  --   --   --   PHOS  --   --  3.7  --   --   --    Liver Function Tests: No results found for this basename: AST, ALT, ALKPHOS, BILITOT, PROT, ALBUMIN,  in the last 168 hours No results found for this basename: LIPASE, AMYLASE,  in the last 168 hours No results found for this basename: AMMONIA,  in the last 168 hours CBC:  Recent Labs Lab 05/23/13 0540 05/23/13 1215 05/24/13 0503 05/25/13 0516  WBC 14.2* 11.6* 9.8 8.6  NEUTROABS 6.5  --   --   --   HGB 10.7* 9.7* 9.1* 9.7*  HCT 32.7* 28.6* 26.9* 30.2*  MCV 88.9 86.4 87.1 87.5  PLT 425* 349 336 360   Cardiac Enzymes:  Recent Labs Lab 05/23/13 0540 05/23/13 0852 05/23/13 1515 05/23/13 2045  TROPONINI <0.30 <0.30 <0.30 <0.30   BNP (last 3 results)  Recent Labs  08/23/12 2335 05/23/13 0540 05/29/13 0520  PROBNP 7978.0* 12897.0* 3477.0*   CBG:  Recent Labs Lab 05/28/13 0609 05/28/13 1147 05/28/13 1621 05/28/13 2147 05/29/13 0607  GLUCAP 110* 266* 93 231* 89    Recent Results (from the past 240 hour(s))  MRSA PCR SCREENING     Status: None   Collection Time    05/23/13 11:46 AM      Result Value Range Status   MRSA by PCR NEGATIVE  NEGATIVE Final   Comment:            The GeneXpert MRSA Assay (FDA     approved for NASAL specimens     only), is one component of a     comprehensive MRSA colonization     surveillance program. It is not     intended to diagnose MRSA     infection nor to guide or     monitor treatment for     MRSA infections.      Studies: No results found.  Scheduled Meds: . aspirin  81 mg Oral Daily  . atorvastatin  20 mg Oral q1800  . carvedilol  25 mg Oral BID WC  . ferrous sulfate  325 mg Oral Q breakfast  . heparin  5,000 Units Subcutaneous Q8H  . insulin aspart  0-5 Units Subcutaneous QHS  . insulin aspart  0-9 Units Subcutaneous TID WC  . insulin aspart  3 Units Subcutaneous TID WC  . insulin detemir  28 Units Subcutaneous QHS  . isosorbide-hydrALAZINE  2 tablet Oral BID  . metolazone  5 mg Oral BID  . sodium chloride  3 mL Intravenous Q12H  . valsartan  320 mg Oral Daily   Continuous Infusions: . furosemide (LASIX) infusion 8 mg/hr (05/29/13 0521)     Charlynne Cousins  [facility] Pager [phone]. If 8PM-8AM, please contact night-coverage at [email or web address], password [facility] 05/29/2013, 8:43 AM  LOS: 6 days

## 2013-05-30 LAB — GLUCOSE, CAPILLARY
Glucose-Capillary: 180 mg/dL — ABNORMAL HIGH (ref 70–99)
Glucose-Capillary: 199 mg/dL — ABNORMAL HIGH (ref 70–99)
Glucose-Capillary: 99 mg/dL (ref 70–99)
Glucose-Capillary: 200 mg/dL — ABNORMAL HIGH (ref 70–99)
Glucose-Capillary: 146 mg/dL — ABNORMAL HIGH (ref 70–99)

## 2013-05-30 LAB — BASIC METABOLIC PANEL
BUN: 53 mg/dL — ABNORMAL HIGH (ref 6–23)
CO2: 33 mEq/L — ABNORMAL HIGH (ref 19–32)
Calcium: 8.8 mg/dL (ref 8.4–10.5)
Chloride: 90 mEq/L — ABNORMAL LOW (ref 96–112)
Creatinine, Ser: 2.16 mg/dL — ABNORMAL HIGH (ref 0.50–1.10)
GFR calc Af Amer: 27 mL/min — ABNORMAL LOW (ref >90)
GFR calc non Af Amer: 23 mL/min — ABNORMAL LOW (ref >90)
Glucose, Bld: 228 mg/dL — ABNORMAL HIGH (ref 70–99)
Potassium: 3.3 mEq/L — ABNORMAL LOW (ref 3.5–5.1)
Sodium: 134 mEq/L — ABNORMAL LOW (ref 135–145)

## 2013-05-30 MED ORDER — METOLAZONE 2.5 MG PO TABS
2.5000 mg | ORAL_TABLET | Freq: Two times a day (BID) | ORAL | Status: DC
  Administered 2013-05-30: 2.5 mg via ORAL
  Filled 2013-05-30 (×2): qty 1

## 2013-05-30 MED ORDER — METOLAZONE 2.5 MG PO TABS
2.5000 mg | ORAL_TABLET | Freq: Two times a day (BID) | ORAL | Status: DC
  Administered 2013-05-30 – 2013-05-31 (×2): 2.5 mg via ORAL
  Administered 2013-05-31: 12:00:00 via ORAL
  Administered 2013-06-01: 2.5 mg via ORAL
  Filled 2013-05-30 (×5): qty 1

## 2013-05-30 MED ORDER — POTASSIUM CHLORIDE CRYS ER 20 MEQ PO TBCR
40.0000 meq | EXTENDED_RELEASE_TABLET | Freq: Two times a day (BID) | ORAL | Status: AC
  Administered 2013-05-30 – 2013-05-31 (×2): 40 meq via ORAL
  Filled 2013-05-30 (×2): qty 2

## 2013-05-30 MED ORDER — MAGNESIUM OXIDE 400 (241.3 MG) MG PO TABS
400.0000 mg | ORAL_TABLET | Freq: Two times a day (BID) | ORAL | Status: AC
  Administered 2013-05-30 – 2013-05-31 (×2): 400 mg via ORAL
  Filled 2013-05-30 (×4): qty 1

## 2013-05-30 MED ORDER — METOLAZONE 2.5 MG PO TABS: 2.5000 mg | ORAL_TABLET | Freq: Every day | ORAL | Status: DC

## 2013-05-30 NOTE — Progress Notes (Signed)
Occupational Therapy Treatment Patient Details Name: Destiny Day MRN: [id number] DOB: 1950-12-27 Today's Date: 05/30/2013 Time: [id number] OT Time Calculation (min): 24 min  OT Assessment / Plan / Recommendation  History of present illness 62 y.o. female, with history of hypertension, type 2 diabetes mellitus, poor medication compliance, chronic diastolic CHF, asthma, dyslipidemia, and arthritis who presented to the hospital with a chief complaints of shortness of breath.  In the ER she was found to be in hypertensive crisis with blood pressure 220/115.  Per husband patient is not compliant with her medications   OT comments  Pt progressing nicely toward POC/goals for acute OT. She is overall supervision level for ADL, transfers. Cont to demonstrate impairments w/ activity tolerance, however she states that this is slowly improving as well. Check goals next visit.  Follow Up Recommendations  Home health OT;Supervision/Assistance - 24 hour    Barriers to Discharge       Equipment Recommendations       Recommendations for Other Services    Frequency Min 2X/week   Progress towards OT Goals Progress towards OT goals: Progressing toward goals  Plan Discharge plan remains appropriate    Precautions / Restrictions Precautions Precautions: Fall Restrictions Weight Bearing Restrictions: No   Pertinent Vitals/Pain No c/o pain/nausea today.    ADL  Grooming: Performed;Wash/dry hands;Wash/dry face;Supervision/safety Where Assessed - Grooming: Unsupported standing Lower Body Dressing: Performed;Supervision/safety;Set up (Don/doff slippers, socks) Where Assessed - Lower Body Dressing: Unsupported sitting;Supported sit to stand Toilet Transfer: Performed;Supervision/safety Toilet Transfer Method: Sit to Loss adjuster, chartered: Regular height toilet;Grab bars Toileting - Clothing Manipulation and Hygiene: Supervision/safety;Performed Where Assessed - Toileting Clothing  Manipulation and Hygiene: Standing Transfers/Ambulation Related to ADLs: moves slowly, supervision for use of RW, but does not require physical assist ADL Comments: Pt also performing functional mobility and ambulation in hallway to assist w/ increase endurance & activity tolerance today. Discussed tub bench use and other possible areas to get them, ie. church, [employer], [employer]. Pt progressing nicely, no c/o nausea today.    OT Diagnosis:    OT Problem List:   OT Treatment Interventions:     OT Goals(current goals can now be found in the care plan section) Acute Rehab OT Goals Patient Stated Goal: home with husband Time For Goal Achievement: 05/31/13 Potential to Achieve Goals: Good  Visit Information  Last OT Received On: 05/30/13 Assistance Needed: +1 History of Present Illness: 62 y.o. female, with history of hypertension, type 2 diabetes mellitus, poor medication compliance, chronic diastolic CHF, asthma, dyslipidemia, and arthritis who presented to the hospital with a chief complaints of shortness of breath.  In the ER she was found to be in hypertensive crisis with blood pressure 220/115.  Per husband patient is not compliant with her medications    Subjective Data      Prior Functioning       Cognition  Cognition Arousal/Alertness: Awake/alert Behavior During Therapy: WFL for tasks assessed/performed Overall Cognitive Status: Within Functional Limits for tasks assessed    Mobility  Bed Mobility Bed Mobility: Not assessed Transfers Transfers: Sit to Stand;Stand to Sit Sit to Stand: 5: Supervision;With upper extremity assist;From chair/3-in-1 Stand to Sit: 5: Supervision;To chair/3-in-1;With armrests Details for Transfer Assistance: pt with good technique            End of Session OT - End of Session Equipment Utilized During Treatment: Rolling walker Activity Tolerance: Patient tolerated treatment well Patient left: in chair;with call bell/phone within  reach  GO  Josephine Igo Dixon 05/30/2013, 10:48 AM

## 2013-05-30 NOTE — Progress Notes (Signed)
Physical Therapy Treatment Patient Details Name: Destiny Day MRN: [id number] DOB: 11-26-1950 Today's Date: 05/30/2013 Time: [id number] PT Time Calculation (min): 24 min  PT Assessment / Plan / Recommendation  History of Present Illness 62 y.o. female, with history of hypertension, type 2 diabetes mellitus, poor medication compliance, chronic diastolic CHF, asthma, dyslipidemia, and arthritis who presented to the hospital with a chief complaints of shortness of breath.  In the ER she was found to be in hypertensive crisis with blood pressure 220/115.  Per husband patient is not compliant with her medications   PT Comments   Pt admitted with above. Pt currently with functional limitations due to balance and endurance deficits. Pt Needs to continue practice with up and down stairs and has desire to progress to cane.  Will continue PT.  Pt will benefit from skilled PT to increase their independence and safety with mobility to allow discharge to the venue listed below.   Follow Up Recommendations  Home health PT;Supervision - Intermittent                 Equipment Recommendations  Rolling walker with 5" wheels        Frequency Min 3X/week   Progress towards PT Goals Progress towards PT goals: Progressing toward goals  Plan Current plan remains appropriate    Precautions / Restrictions Precautions Precautions: Fall Restrictions Weight Bearing Restrictions: No   Pertinent Vitals/Pain VSS, No pain    Mobility  Bed Mobility Bed Mobility: Not assessed Transfers Transfers: Sit to Stand;Stand to Sit Sit to Stand: 5: Supervision;With upper extremity assist;From chair/3-in-1 Stand to Sit: 5: Supervision;To chair/3-in-1;With armrests Details for Transfer Assistance: pt with good technique Ambulation/Gait Ambulation/Gait Assistance: 6: Modified independent (Device/Increase time) Ambulation Distance (Feet): 350 Feet Assistive device: Rolling walker Ambulation/Gait Assistance  Details: ambulates with slow and guarded gait with verbal cues to stand upright and stay close to RW.  Incr bil UE weight bearing as she fatigues as well as incr SOB as she fatigues.  C/o weakness in LEs toward end of walk.   Gait Pattern: Step-through pattern;Decreased stride length Gait velocity: slow/cautious Stairs: Yes Stairs Assistance: 4: Min guard Stairs Assistance Details (indicate cue type and reason): needed cues for technique and steadying assit.  Made pt very tired.  Showed pt how to go up with RW backwards as well.   Stair Management Technique: One rail Left;Forwards;Step to pattern Number of Stairs: 4 Wheelchair Mobility Wheelchair Mobility: No    PT Goals (current goals can now be found in the care plan section)    Visit Information  Last PT Received On: 05/30/13 Assistance Needed: +1 History of Present Illness: 63 y.o. female, with history of hypertension, type 2 diabetes mellitus, poor medication compliance, chronic diastolic CHF, asthma, dyslipidemia, and arthritis who presented to the hospital with a chief complaints of shortness of breath.  In the ER she was found to be in hypertensive crisis with blood pressure 220/115.  Per husband patient is not compliant with her medications    Subjective Data  Subjective: "I can do the stairs."   Cognition  Cognition Arousal/Alertness: Awake/alert Behavior During Therapy: WFL for tasks assessed/performed Overall Cognitive Status: Within Functional Limits for tasks assessed    Balance  Dynamic Sitting Balance Dynamic Sitting - Level of Assistance: Not tested (comment) Dynamic Standing Balance Dynamic Standing - Balance Support: Bilateral upper extremity supported;During functional activity Dynamic Standing - Level of Assistance: 4: Min assist  End of Session PT - End of Session Equipment  Utilized During Treatment: Gait belt Activity Tolerance: Patient tolerated treatment well Patient left: in chair;with call bell/phone  within reach Nurse Communication: Mobility status        Day,Destiny Mahurin 05/30/2013, 4:00 PM [facility] Acute Rehabilitation [phone] (pager)

## 2013-05-30 NOTE — Progress Notes (Deleted)
Physical Therapy Treatment Patient Details Name: Destiny Day MRN: [id number] DOB: August 17, 1950 Today's Date: 05/30/2013 Time: [id number] PT Time Calculation (min): 24 min  PT Assessment / Plan / Recommendation  History of Present Illness 62 y.o. female, with history of hypertension, type 2 diabetes mellitus, poor medication compliance, chronic diastolic CHF, asthma, dyslipidemia, and arthritis who presented to the hospital with a chief complaints of shortness of breath.  In the ER she was found to be in hypertensive crisis with blood pressure 220/115.  Per husband patient is not compliant with her medications   PT Comments   Pt admitted with above. Pt currently with functional limitations due to balance and endurance deficits. Pt will benefit from skilled PT to increase their independence and safety with mobility to allow discharge to the venue listed below.   Follow Up Recommendations  Home health PT;Supervision - Intermittent                 Equipment Recommendations  Rolling walker with 5" wheels        Frequency Min 3X/week   Progress towards PT Goals Progress towards PT goals: Progressing toward goals  Plan Current plan remains appropriate    Precautions / Restrictions Precautions Precautions: Fall Restrictions Weight Bearing Restrictions: No   Pertinent Vitals/Pain VSS,No pain    Mobility  Bed Mobility Bed Mobility: Not assessed Transfers Transfers: Sit to Stand;Stand to Sit Sit to Stand: 5: Supervision;With upper extremity assist;From chair/3-in-1 Stand to Sit: 5: Supervision;To chair/3-in-1;With armrests Details for Transfer Assistance: pt with good technique Ambulation/Gait Ambulation/Gait Assistance: 6: Modified independent (Device/Increase time) Ambulation Distance (Feet): 350 Feet Assistive device: Rolling walker Ambulation/Gait Assistance Details: ambulates with slow and guarded gait with verbal cues to stand upright and stay close to RW.  Incr bil UE  weight bearing as she fatigues as well as incr SOB as she fatigues.  C/o weakness in LEs toward end of walk.   Gait Pattern: Step-through pattern;Decreased stride length Gait velocity: slow/cautious Stairs: Yes Stairs Assistance: 4: Min guard Stairs Assistance Details (indicate cue type and reason): needed cues for technique and steadying assit.  Made pt very tired.  Showed pt how to go up with RW backwards as well.   Stair Management Technique: One rail Left;Forwards;Step to pattern Number of Stairs: 4 Wheelchair Mobility Wheelchair Mobility: No     PT Goals (current goals can now be found in the care plan section)    Visit Information  Last PT Received On: 05/30/13 Assistance Needed: +1 History of Present Illness: 62 y.o. female, with history of hypertension, type 2 diabetes mellitus, poor medication compliance, chronic diastolic CHF, asthma, dyslipidemia, and arthritis who presented to the hospital with a chief complaints of shortness of breath.  In the ER she was found to be in hypertensive crisis with blood pressure 220/115.  Per husband patient is not compliant with her medications    Subjective Data  Subjective: "I can do the stairs."   Cognition  Cognition Arousal/Alertness: Awake/alert Behavior During Therapy: WFL for tasks assessed/performed Overall Cognitive Status: Within Functional Limits for tasks assessed    Balance  Dynamic Sitting Balance Dynamic Sitting - Level of Assistance: Not tested (comment) Dynamic Standing Balance Dynamic Standing - Balance Support: Bilateral upper extremity supported;During functional activity Dynamic Standing - Level of Assistance: 4: Min assist  End of Session PT - End of Session Equipment Utilized During Treatment: Gait belt Activity Tolerance: Patient tolerated treatment well Patient left: in chair;with call bell/phone within reach Nurse Communication: Mobility  status       INGOLD,Keryl Gholson 05/30/2013, 3:55 PM [facility] Acute  Rehabilitation [phone] (pager)

## 2013-05-30 NOTE — Progress Notes (Addendum)
[facility] PROGRESS NOTE  Assessment/Plan: Diastolic CHF, acute on chronic - Cont lasix drip and metolazone. + JVD and lower extremity edema. - Max dose ARB, bidil and coreg,  BP improved. - Good UOP on current dose.  - Estimated dry weight is 140 lb. Check standing weights. - check for RAS renal doppler flow.  Hypertensive heart disease/*Malignant hypertension - BP stable, elevation likely due to non compliance. - D/cnorvasc. - valsartan, coreg,  Lasix and Bidil. Avoid aldactone due to elevated Cr.  Acute respiratory failure due to  Pulmonary edema - Resolved. - sat >90% on RA.  Diabetic nephropathy/CKD stage 3 - Creat 1.6 - I think her Cr. was hemodiluted. - Cr stable 1.9-2.1  DIABETES MELLITUS, TYPE II - improved controlled on current regimen. - d'cd 70/30 and now Levemir plus SSI. - change to lantus.   HYPERLIPIDEMIA - cont statins    Code Status: full Family Communication: none  Disposition Plan: inaptient   Consultants:  none  Procedures: ECho 0000000: grade 2 diastolic dysfunction   Antibiotics:  none  HPI/Subjective: No SOB  Objective: Filed Vitals:   05/29/13 0617 05/29/13 1423 05/29/13 2007 05/30/13 0451  BP: 144/63 168/72 152/71 137/62  Pulse: 72 80 74 74  Temp: 97.6 F (36.4 C) 98.3 F (36.8 C) 98.1 F (36.7 C) 98 F (36.7 C)  TempSrc: Oral Oral Oral Oral  Resp: 17 18 20 20   Height:      Weight: 71.4 kg (157 lb 6.5 oz)   70.6 kg (155 lb 10.3 oz)  SpO2: 100% 100% 97% 95%    Intake/Output Summary (Last 24 hours) at 05/30/13 0728 Last data filed at 05/30/13 [id number]  Gross per 24 hour  Intake 1339.2 ml  Output   3250 ml  Net -1910.8 ml   Filed Weights   05/28/13 0632 05/29/13 0617 05/30/13 0451  Weight: 72.1 kg (158 lb 15.2 oz) 71.4 kg (157 lb 6.5 oz) 70.6 kg (155 lb 10.3 oz)    Exam:  General: Alert, awake, oriented x3, in no acute distress.  HEENT: No bruits, no goiter. + JVD, able to lay flat Heart: Regular rate  and rhythm, lower extremity edema + 1. Lungs: Good air movement, bilateral air movement.  Abdomen: Soft, nontender, nondistended, positive bowel sounds.  Neuro: Grossly intact, nonfocal.   Data Reviewed: Basic Metabolic Panel:  Recent Labs Lab 05/24/13 0503 05/25/13 0516 05/27/13 1020 05/28/13 0625 05/29/13 1000 05/30/13 0440  NA 134* 137 135 137 132* 134*  K 3.5 4.0 3.8 3.5 4.1 3.3*  CL 99 98 94* 96 91* 90*  CO2 26 27 30 30 29  33*  GLUCOSE 55* 198* 134* 94 138* 228*  BUN 26* 32* 40* 44* 47* 53*  CREATININE 1.94* 1.86* 2.07* 2.03* 1.93* 2.16*  CALCIUM 8.4 8.8 8.8 8.9 9.0 8.8  MG 1.8  --   --   --   --   --   PHOS 3.7  --   --   --   --   --    Liver Function Tests: No results found for this basename: AST, ALT, ALKPHOS, BILITOT, PROT, ALBUMIN,  in the last 168 hours No results found for this basename: LIPASE, AMYLASE,  in the last 168 hours No results found for this basename: AMMONIA,  in the last 168 hours CBC:  Recent Labs Lab 05/23/13 1215 05/24/13 0503 05/25/13 0516  WBC 11.6* 9.8 8.6  HGB 9.7* 9.1* 9.7*  HCT 28.6* 26.9* 30.2*  MCV 86.4 87.1 87.5  PLT  349 336 360   Cardiac Enzymes:  Recent Labs Lab 05/23/13 0852 05/23/13 1515 05/23/13 2045  TROPONINI <0.30 <0.30 <0.30   BNP (last 3 results)  Recent Labs  08/23/12 2335 05/23/13 0540 05/29/13 0520  PROBNP 7978.0* 12897.0* 3477.0*   CBG:  Recent Labs Lab 05/29/13 1035 05/29/13 1614 05/29/13 2058 05/30/13 0548 05/30/13 0610  GLUCAP 120* 220* 125* 199* 180*    Recent Results (from the past 240 hour(s))  MRSA PCR SCREENING     Status: None   Collection Time    05/23/13 11:46 AM      Result Value Range Status   MRSA by PCR NEGATIVE  NEGATIVE Final   Comment:            The GeneXpert MRSA Assay (FDA     approved for NASAL specimens     only), is one component of a     comprehensive MRSA colonization     surveillance program. It is not     intended to diagnose MRSA     infection nor to  guide or     monitor treatment for     MRSA infections.     Studies: No results found.  Scheduled Meds: . aspirin  81 mg Oral Daily  . atorvastatin  20 mg Oral q1800  . carvedilol  25 mg Oral BID WC  . ferrous sulfate  325 mg Oral Q breakfast  . heparin  5,000 Units Subcutaneous Q8H  . insulin aspart  0-5 Units Subcutaneous QHS  . insulin aspart  0-9 Units Subcutaneous TID WC  . insulin aspart  3 Units Subcutaneous TID WC  . insulin detemir  28 Units Subcutaneous QHS  . isosorbide-hydrALAZINE  2 tablet Oral BID  . metolazone  5 mg Oral BID  . potassium chloride  40 mEq Oral BID  . sodium chloride  3 mL Intravenous Q12H  . valsartan  320 mg Oral Daily   Continuous Infusions: . furosemide (LASIX) infusion 8 mg/hr (05/29/13 0521)     Charlynne Cousins  [facility] Pager [phone]. If 8PM-8AM, please contact night-coverage at [email or web address], password [facility] 05/30/2013, 7:28 AM  LOS: 7 days

## 2013-05-30 NOTE — Progress Notes (Signed)
I cosign for [employer]'s Production assistant, radio) assessment, I&O's, care plan, education, and med administration.

## 2013-05-31 ENCOUNTER — Ambulatory Visit: Payer: BC Managed Care – PPO | Admitting: Cardiology

## 2013-05-31 LAB — GLUCOSE, CAPILLARY
Glucose-Capillary: 80 mg/dL (ref 70–99)
Glucose-Capillary: 51 mg/dL — ABNORMAL LOW (ref 70–99)
Glucose-Capillary: 140 mg/dL — ABNORMAL HIGH (ref 70–99)
Glucose-Capillary: 135 mg/dL — ABNORMAL HIGH (ref 70–99)
Glucose-Capillary: 233 mg/dL — ABNORMAL HIGH (ref 70–99)
Glucose-Capillary: 147 mg/dL — ABNORMAL HIGH (ref 70–99)

## 2013-05-31 LAB — BASIC METABOLIC PANEL
BUN: 60 mg/dL — ABNORMAL HIGH (ref 6–23)
CO2: 36 mEq/L — ABNORMAL HIGH (ref 19–32)
Calcium: 9.4 mg/dL (ref 8.4–10.5)
Chloride: 91 mEq/L — ABNORMAL LOW (ref 96–112)
Creatinine, Ser: 2.27 mg/dL — ABNORMAL HIGH (ref 0.50–1.10)
GFR calc Af Amer: 25 mL/min — ABNORMAL LOW (ref >90)
GFR calc non Af Amer: 22 mL/min — ABNORMAL LOW (ref >90)
Glucose, Bld: 122 mg/dL — ABNORMAL HIGH (ref 70–99)
Potassium: 3.8 mEq/L (ref 3.5–5.1)
Sodium: 137 mEq/L (ref 135–145)

## 2013-05-31 MED ORDER — DEXTROSE 50 % IV SOLN: 25.0000 mL | Freq: Once | INTRAVENOUS | Status: AC | PRN

## 2013-05-31 MED ORDER — FUROSEMIDE 40 MG PO TABS
40.0000 mg | ORAL_TABLET | Freq: Two times a day (BID) | ORAL | Status: DC
  Administered 2013-05-31 – 2013-06-01 (×2): 40 mg via ORAL
  Filled 2013-05-31 (×5): qty 1

## 2013-05-31 MED ORDER — INSULIN DETEMIR 100 UNIT/ML ~~LOC~~ SOLN
20.0000 [IU] | Freq: Every day | SUBCUTANEOUS | Status: DC
  Filled 2013-05-31 (×2): qty 0.2

## 2013-05-31 MED ORDER — GLUCOSE 40 % PO GEL
1.0000 | ORAL | Status: DC | PRN
  Administered 2013-05-31: 37.5 g via ORAL

## 2013-05-31 MED ORDER — AMLODIPINE BESYLATE 10 MG PO TABS
10.0000 mg | ORAL_TABLET | Freq: Every day | ORAL | Status: DC
  Administered 2013-05-31 – 2013-06-01 (×2): 10 mg via ORAL
  Filled 2013-05-31 (×2): qty 1

## 2013-05-31 MED ORDER — GLUCOSE 40 % PO GEL
ORAL | Status: AC
  Filled 2013-05-31: qty 1

## 2013-05-31 NOTE — Significant Event (Signed)
Hypoglycemic Event  CBG: 51  Treatment: glucose gel and breakfast PO  Symptoms: "cold sweat, hungry, kind of out of it."  Follow-up CBG: Time:0750hrs CBG Result:140  Possible Reasons for Event: Patient did not receive sufficient snacks during the night.  Comments/MD notified: Gherghe MD    Altha Harm T  Remember to initiate Hypoglycemia Order Set & complete

## 2013-05-31 NOTE — Progress Notes (Signed)
[facility] PROGRESS NOTE  Destiny Day [id number] DOB: [birth year] DOA: 05/23/2013 PCP: Salena Saner., MD  HPI: Destiny Day is a 62 y.o. female, with history of hypertension ROM a type 2 diabetes mellitus, poor medication compliance, chronic diastolic CHF, asthma, dyslipidemia, arthritis presents to the hospital with chief complaints of shortness of breath, in the ER she was found to be in hypertensive crisis with blood pressure 220/115, per husband patient is not compliant with her medications, also her glucose was in excess of 300, chest x-ray was suggestive of pulmonary edema, she was given nitroglycerin IV drip, Lasix along with CPAP with good results, pulmonary critical care was consulted who suggested hospitalist admission.  Assessment/Plan: Diastolic CHF, acute on chronic  - discontinue Lasix gtt today given worsening renal function. Switch to po. Continue Metolazone.   - Max dose ARB, bidil and coreg, BP improved.  - Good UOP still, monitor now that she is off of her lasix drip. - Estimated dry weight is 140 lb. Check standing weights.  - check for RAS renal doppler flow.  Hypertensive heart disease/Malignant hypertension  - BP elevated.  - resume norvasc.  - valsartan, coreg, Lasix and Bidil. Avoid aldactone due to elevated Cr.  Acute respiratory failure due to Pulmonary edema  - Resolved.  - sat >90% on RA.  Diabetic nephropathy/CKD stage 3 - Creat 1.6  - Cr relatively stable, mild worsening today DIABETES MELLITUS, TYPE II  - improved controlled on current regimen.  - d'cd 70/30 and now Levemir plus SSI.  - change to lantus.  HYPERLIPIDEMIA  - cont statins  Diet: carb modified Fluids: none DVT Prophylaxis: heparin  Code Status: Full Family Communication: none  Disposition Plan: inpatient  Consultants:  none  Procedures:  2D echo pending.   Antibiotics - none  HPI/Subjective: Feeling better, breathing  comfortably.  Objective: Filed Vitals:   05/30/13 2006 05/31/13 0506 05/31/13 0850 05/31/13 1019  BP: 142/69 143/60 197/79 181/83  Pulse: 76 72 68   Temp: 98.2 F (36.8 C) 98 F (36.7 C) 97.9 F (36.6 C)   TempSrc: Oral Oral Oral   Resp: 20 20 17    Height:      Weight:  69.1 kg (152 lb 5.4 oz)    SpO2: 99% 98% 97%     Intake/Output Summary (Last 24 hours) at 05/31/13 1247 Last data filed at 05/31/13 1200  Gross per 24 hour  Intake 1522.4 ml  Output   4850 ml  Net -3327.6 ml   Filed Weights   05/29/13 0617 05/30/13 0451 05/31/13 0506  Weight: 71.4 kg (157 lb 6.5 oz) 70.6 kg (155 lb 10.3 oz) 69.1 kg (152 lb 5.4 oz)    Exam:   General:  NAD  Cardiovascular: regular rate and rhythm, without MRG, minimal JVD  Respiratory: good air movement, clear to auscultation throughout, no wheezing, ronchi or rales  Abdomen: soft, not tender to palpation, positive bowel sounds  MSK: 1+ peripheral edema  Neuro: non focal  Data Reviewed: Basic Metabolic Panel:  Recent Labs Lab 05/27/13 1020 05/28/13 0625 05/29/13 1000 05/30/13 0440 05/31/13 0415  NA 135 137 132* 134* 137  K 3.8 3.5 4.1 3.3* 3.8  CL 94* 96 91* 90* 91*  CO2 30 30 29  33* 36*  GLUCOSE 134* 94 138* 228* 122*  BUN 40* 44* 47* 53* 60*  CREATININE 2.07* 2.03* 1.93* 2.16* 2.27*  CALCIUM 8.8 8.9 9.0 8.8 9.4   CBC:  Recent Labs Lab 05/25/13 0516  WBC 8.6  HGB 9.7*  HCT 30.2*  MCV 87.5  PLT 360   BNP (last 3 results)  Recent Labs  08/23/12 2335 05/23/13 0540 05/29/13 0520  PROBNP 7978.0* 12897.0* 3477.0*   CBG:  Recent Labs Lab 05/30/13 2101 05/31/13 0611 05/31/13 0725 05/31/13 0749 05/31/13 1124  GLUCAP 146* 80 51* 140* 135*    Recent Results (from the past 240 hour(s))  MRSA PCR SCREENING     Status: None   Collection Time    05/23/13 11:46 AM      Result Value Range Status   MRSA by PCR NEGATIVE  NEGATIVE Final   Comment:            The GeneXpert MRSA Assay (FDA     approved  for NASAL specimens     only), is one component of a     comprehensive MRSA colonization     surveillance program. It is not     intended to diagnose MRSA     infection nor to guide or     monitor treatment for     MRSA infections.     Studies: No results found.  Scheduled Meds: . aspirin  81 mg Oral Daily  . atorvastatin  20 mg Oral q1800  . carvedilol  25 mg Oral BID WC  . ferrous sulfate  325 mg Oral Q breakfast  . furosemide  40 mg Oral BID  . heparin  5,000 Units Subcutaneous Q8H  . insulin aspart  0-5 Units Subcutaneous QHS  . insulin aspart  0-9 Units Subcutaneous TID WC  . insulin aspart  3 Units Subcutaneous TID WC  . insulin detemir  20 Units Subcutaneous QHS  . isosorbide-hydrALAZINE  2 tablet Oral BID  . metolazone  2.5 mg Oral BID  . valsartan  320 mg Oral Daily   Continuous Infusions:   Principal Problem:   Malignant hypertension Active Problems:   Type II or unspecified type diabetes mellitus without mention of complication, not stated as uncontrolled   HYPERLIPIDEMIA   ASTHMA   Diastolic CHF, acute on chronic   HTN (hypertension), malignant   Chronic diastolic CHF (congestive heart failure)   Acute respiratory failure   Pulmonary edema   Type 2 diabetes mellitus with diabetic nephropathy   Hypertensive heart disease   CKD (chronic kidney disease) stage 3, GFR 30-59 ml/min   Time spent: Sarg[place], MD [facility] Pager [phone]. If 7 PM - 7 AM, please contact night-coverage at [email or web address], password [facility] 05/31/2013, 12:47 PM  LOS: 8 days

## 2013-06-01 LAB — BASIC METABOLIC PANEL
BUN: 63 mg/dL — ABNORMAL HIGH (ref 6–23)
CO2: 33 mEq/L — ABNORMAL HIGH (ref 19–32)
Calcium: 9.6 mg/dL (ref 8.4–10.5)
Chloride: 89 mEq/L — ABNORMAL LOW (ref 96–112)
Creatinine, Ser: 2.22 mg/dL — ABNORMAL HIGH (ref 0.50–1.10)
GFR calc Af Amer: 26 mL/min — ABNORMAL LOW (ref >90)
GFR calc non Af Amer: 23 mL/min — ABNORMAL LOW (ref >90)
Glucose, Bld: 188 mg/dL — ABNORMAL HIGH (ref 70–99)
Potassium: 3.7 mEq/L (ref 3.5–5.1)
Sodium: 134 mEq/L — ABNORMAL LOW (ref 135–145)

## 2013-06-01 LAB — GLUCOSE, CAPILLARY
Glucose-Capillary: 139 mg/dL — ABNORMAL HIGH (ref 70–99)
Glucose-Capillary: 177 mg/dL — ABNORMAL HIGH (ref 70–99)
Glucose-Capillary: 255 mg/dL — ABNORMAL HIGH (ref 70–99)

## 2013-06-01 LAB — CBC
HCT: 30.2 % — ABNORMAL LOW (ref 36.0–46.0)
Hemoglobin: 10.1 g/dL — ABNORMAL LOW (ref 12.0–15.0)
MCH: 29.4 pg (ref 26.0–34.0)
MCHC: 33.4 g/dL (ref 30.0–36.0)
MCV: 88 fL (ref 78.0–100.0)
Platelets: 373 10*3/uL (ref 150–400)
RBC: 3.43 MIL/uL — ABNORMAL LOW (ref 3.87–5.11)
RDW: 13.2 % (ref 11.5–15.5)
WBC: 7.5 10*3/uL (ref 4.0–10.5)

## 2013-06-01 MED ORDER — AMLODIPINE BESYLATE-VALSARTAN 10-320 MG PO TABS: 1.0000 | ORAL_TABLET | Freq: Every day | ORAL | Status: DC

## 2013-06-01 MED ORDER — ISOSORB DINITRATE-HYDRALAZINE 20-37.5 MG PO TABS: 2.0000 | ORAL_TABLET | Freq: Two times a day (BID) | ORAL | Status: DC

## 2013-06-01 MED ORDER — CARVEDILOL 25 MG PO TABS: 25.0000 mg | ORAL_TABLET | Freq: Two times a day (BID) | ORAL | Status: DC

## 2013-06-01 MED ORDER — ATORVASTATIN CALCIUM 20 MG PO TABS: 20.0000 mg | ORAL_TABLET | Freq: Every day | ORAL | Status: DC

## 2013-06-01 MED ORDER — FUROSEMIDE 40 MG PO TABS: 40.0000 mg | ORAL_TABLET | Freq: Two times a day (BID) | ORAL | Status: DC

## 2013-06-01 NOTE — Progress Notes (Signed)
Occupational Therapy Treatment Patient Details Name: Destiny Day MRN: [id number] DOB: [birth year] Today's Date: 06/01/2013 Time: [id number] OT Time Calculation (min): 36 min  OT Assessment / Plan / Recommendation  History of present illness 62 y.o. female, with history of hypertension, type 2 diabetes mellitus, poor medication compliance, chronic diastolic CHF, asthma, dyslipidemia, and arthritis who presented to the hospital with a chief complaints of shortness of breath.  In the ER she was found to be in hypertensive crisis with blood pressure 220/115.  Per husband patient is not compliant with her medications   OT comments  Pt plans to d/c home later today w/ husband assist. Reviewed OT recommendations for DME and energy conservation techniques. BP after funtional mobility in room/hall 122/70, HR 80. Pt Mod I level and meeting goals, see care plan.  Follow Up Recommendations  Home health OT;Supervision/Assistance - 24 hour    Barriers to Discharge       Equipment Recommendations       Recommendations for Other Services    Frequency Min 2X/week   Progress towards OT Goals Progress towards OT goals: Goals met and updated - see care plan  Plan Discharge plan remains appropriate    Precautions / Restrictions Precautions Precautions: Fall Restrictions Weight Bearing Restrictions: No   Pertinent Vitals/Pain No denies pain    ADL  Grooming: Performed;Modified independent;Wash/dry hands;Wash/dry face;Teeth care Where Assessed - Grooming: Unsupported sitting;Unsupported standing Upper Body Bathing: Performed;Chest;Right arm;Left arm;Abdomen;Modified independent Where Assessed - Upper Body Bathing: Unsupported sitting Lower Body Bathing: Performed;Modified independent Where Assessed - Lower Body Bathing: Unsupported sitting;Supported sit to stand Upper Body Dressing: Performed;Modified independent Where Assessed - Upper Body Dressing: Unsupported sitting Lower Body Dressing:  Modified independent;Performed Where Assessed - Lower Body Dressing: Unsupported sitting;Supported sit to stand Toilet Transfer: Performed;Modified independent Toilet Transfer Method: Sit to Loss adjuster, chartered: Comfort height toilet Toileting - Clothing Manipulation and Hygiene: Performed;Modified independent Where Assessed - Toileting Clothing Manipulation and Hygiene: Standing Tub/Shower Transfer Method: Not assessed Equipment Used: Rolling walker Transfers/Ambulation Related to ADLs: Pt ambulating Mod I in hall upon OT arrival today. Pt Mod I toilet transfers and functional mobility in room ADL Comments: Pt Mod I for grooming, bathing, dressing UB/LB today. Pt mod I toileting all aspects. No c/o fatigue or activity intolerance. Pt plans to d/c home later today w/ husband assist. Reviewed OT recommendations for DME and energy conservation techniques. BP after funtional mobility in room/hall 122/70, HR 80.    OT Diagnosis:    OT Problem List:   OT Treatment Interventions:     OT Goals(current goals can now be found in the care plan section) Acute Rehab OT Goals Patient Stated Goal: home with husband OT Goal Formulation: With patient Time For Goal Achievement: 05/31/13 Potential to Achieve Goals: Good  Visit Information  Last OT Received On: 06/01/13 History of Present Illness: 62 y.o. female, with history of hypertension, type 2 diabetes mellitus, poor medication compliance, chronic diastolic CHF, asthma, dyslipidemia, and arthritis who presented to the hospital with a chief complaints of shortness of breath.  In the ER she was found to be in hypertensive crisis with blood pressure 220/115.  Per husband patient is not compliant with her medications    Subjective Data      Prior Functioning       Cognition  Cognition Arousal/Alertness: Awake/alert Behavior During Therapy: WFL for tasks assessed/performed Overall Cognitive Status: Within Functional Limits for tasks  assessed    Mobility  Bed Mobility Bed Mobility:  Not assessed Transfers Transfers: Sit to Stand;Stand to Sit Sit to Stand: 6: Modified independent (Device/Increase time);From bed;From chair/3-in-1;From toilet;With armrests Stand to Sit: 6: Modified independent (Device/Increase time);To bed;To chair/3-in-1;To toilet;With armrests Details for Transfer Assistance: pt with good technique             End of Session OT - End of Session Equipment Utilized During Treatment: Rolling walker Activity Tolerance: Patient tolerated treatment well Patient left: in chair;with call bell/phone within reach;with nursing/sitter in room Nurse Communication: Mobility status;Other (comment) (BP 122/70 HR 80 after activity today)  GO     Carlynn Herald, Shalinda Burkholder Beth Dixon 06/01/2013, 9:22 AM

## 2013-06-01 NOTE — Progress Notes (Signed)
Physical Therapy Treatment and D/C Patient Details Name: Destiny Day MRN: [id number] DOB: [birth year] Today's Date: 06/01/2013 Time: [id number] PT Time Calculation (min): 9 min  PT Assessment / Plan / Recommendation  History of Present Illness 62 y.o. female, with history of hypertension, type 2 diabetes mellitus, poor medication compliance, chronic diastolic CHF, asthma, dyslipidemia, and arthritis who presented to the hospital with a chief complaints of shortness of breath.  In the ER she was found to be in hypertensive crisis with blood pressure 220/115.  Per husband patient is not compliant with her medications   PT Comments   Pt admitted with above. Pt currently has met PT goals for inpatient stay.  Pt no longer needs skilled PT.  Talked about home set up as pt states she cannot afford HHPT f/u.  Discussed safety in home and pt appears to have home set up for safety based on discussion.  No further PT needs at this time.     Follow Up Recommendations  No PT follow up;Supervision - Intermittent                 Equipment Recommendations  Rolling walker with 5" wheels        Frequency Min 3X/week   Progress towards PT Goals Progress towards PT goals: Goals met/education completed, patient discharged from PT  Plan Current plan remains appropriate    Precautions / Restrictions Precautions Precautions: None Restrictions Weight Bearing Restrictions: No   Pertinent Vitals/Pain VSS, No pain    Mobility  Bed Mobility Bed Mobility: Not assessed Transfers Transfers: Sit to Stand;Stand to Sit Sit to Stand: 6: Modified independent (Device/Increase time);From bed;From chair/3-in-1;From toilet;With armrests Stand to Sit: 6: Modified independent (Device/Increase time);To bed;To chair/3-in-1;To toilet;With armrests Details for Transfer Assistance: pt with good technique Ambulation/Gait Ambulation/Gait Assistance: 6: Modified independent (Device/Increase time) Ambulation Distance  (Feet): 200 Feet Assistive device: Rolling walker Ambulation/Gait Assistance Details: Pt ambulates with RW in room and halls with Modif I.  All PT goals met.  Pt ready for d/c home when medically stable.   Gait Pattern: Within Functional Limits Gait velocity: slow/cautious Stairs: No Stairs Assistance Details (indicate cue type and reason): offered to practice again but pt declined. Wheelchair Mobility Wheelchair Mobility: No     PT Goals (current goals can now be found in the care plan section) Acute Rehab PT Goals Patient Stated Goal: home with husband  Visit Information  Last PT Received On: 06/01/13 Assistance Needed: +1 History of Present Illness: 62 y.o. female, with history of hypertension, type 2 diabetes mellitus, poor medication compliance, chronic diastolic CHF, asthma, dyslipidemia, and arthritis who presented to the hospital with a chief complaints of shortness of breath.  In the ER she was found to be in hypertensive crisis with blood pressure 220/115.  Per husband patient is not compliant with her medications    Subjective Data  Subjective: "I am ready to go." Patient Stated Goal: home with husband   Cognition  Cognition Arousal/Alertness: Awake/alert Behavior During Therapy: WFL for tasks assessed/performed Overall Cognitive Status: Within Functional Limits for tasks assessed    Balance  Dynamic Sitting Balance Dynamic Sitting - Level of Assistance: Not tested (comment) Dynamic Standing Balance Dynamic Standing - Balance Support: Bilateral upper extremity supported;During functional activity Dynamic Standing - Level of Assistance: 6: Modified independent (Device/Increase time)  End of Session PT - End of Session Equipment Utilized During Treatment: Gait belt Activity Tolerance: Patient tolerated treatment well Patient left: in chair;with call bell/phone within reach Nurse  Communication: Mobility status        INGOLD,Destiny Day 06/01/2013, 10:18 AM [facility] [phone] (pager)

## 2013-06-01 NOTE — Discharge Summary (Signed)
Physician Discharge Summary  Destiny Day [id number] DOB: 1950-09-09 DOA: 05/23/2013  PCP: Salena Saner., MD  Admit date: 05/23/2013 Discharge date: 06/01/2013  Time spent: 45 minutes  Recommendations for Outpatient Follow-up:  1. Follow up with PCP in ~5 days. Appointment has been made for Monday Nov 10th at 9:30 2. Follow up with Dr. Einar Gip in 1 week   Recommendations for primary care physician for things to follow:  Repeat BMP  Discharge Diagnoses:  Principal Problem:   Malignant hypertension Active Problems:   Type II or unspecified type diabetes mellitus without mention of complication, not stated as uncontrolled   HYPERLIPIDEMIA   ASTHMA   Diastolic CHF, acute on chronic   HTN (hypertension), malignant   Chronic diastolic CHF (congestive heart failure)   Acute respiratory failure   Pulmonary edema   Type 2 diabetes mellitus with diabetic nephropathy   Hypertensive heart disease   CKD (chronic kidney disease) stage 3, GFR 30-59 ml/min  Discharge Condition: stable  Diet recommendation: low sodium, heart healthy  Filed Weights   05/30/13 0451 05/31/13 0506 06/01/13 0525  Weight: 70.6 kg (155 lb 10.3 oz) 69.1 kg (152 lb 5.4 oz) 64.501 kg (142 lb 3.2 oz)   History of present illness:  Destiny Day is a 62 y.o. female, with history of hypertension ROM a type 2 diabetes mellitus, poor medication compliance, chronic diastolic CHF, asthma, dyslipidemia, arthritis presents to the hospital with chief complaints of shortness of breath, in the ER she was found to be in hypertensive crisis with blood pressure 220/115, per husband patient is not compliant with her medications, also her glucose was in excess of 300, chest x-ray was suggestive of pulmonary edema, she was given nitroglycerin IV drip, Lasix along with CPAP with good results, pulmonary critical care was consulted who suggested hospitalist admission.  Hospital Course:  Diastolic CHF, acute on chronic -  patient was on Lasix gtt with excellent urine output; her drip was discontinued on 11/5 as patient was euvolemic and had mild AKI and was transitioned to oral Lasix. During this hospitalization she was net negative 17.2 L, with significant improvement in her edema. Patient was clinically at baseline and she was able to ambulate on her own in the hallway breathing comfortably on room air. She was counseled extensively regarding daily weights, salt restriction, dietary approach and compliance with her medications. An appointment was scheduled for her to see her PCP on 06/05/2013 at 9:30 am. With transitioning from Lasix gtt to oral Lasix her renal function has stabilized and she will need a repeat BMP in her PCP office. She was given prescriptions for all of her home medications on discharge including Lasix.  Hypertensive heart disease/Malignant hypertension - BP with better control now, she is to follow up with her PCP on Monday for recheck.  Acute respiratory failure due to Pulmonary edema  - Resolved.  - sat >90% on RA.  Diabetic nephropathy/CKD stage 3 - Cr overall stable after diuresis, repeat BMP on Monday in PCP office.  DIABETES MELLITUS, TYPE II - to resume her home medications.  HYPERLIPIDEMIA - cont statins  Procedures:  2D echo Study Conclusions - Left ventricle: The cavity size was normal. Wall thickness was increased in a pattern of mild LVH. There was mild concentric hypertrophy. Systolic function was normal. The estimated ejection fraction was in the range of 50% to 55%. Wall motion was normal; there were no regional wall motion abnormalities. Features are consistent with a pseudonormal left ventricular filling pattern, with  concomitant abnormal relaxation and increased filling pressure (grade 2 diastolic dysfunction). Doppler parameters are consistent with both elevated ventricular end-diastolic filling pressure and elevated left atrial filling pressure. - Atrial septum: No defect or  patent foramen ovale was identified.   Consultations:  none  Discharge Exam: Filed Vitals:   06/01/13 0625 06/01/13 0629 06/01/13 0937 06/01/13 1117  BP: 170/70 190/70 168/80 122/62  Pulse: 77 77 76   Temp:      TempSrc:      Resp:      Height:      Weight:      SpO2:       General: NAD Cardiovascular: RRR Respiratory: CTA biL  Discharge Instructions    Medication List    STOP taking these medications       cloNIDine 0.2 mg/24hr patch  Commonly known as:  CATAPRES - Dosed in mg/24 hr     metFORMIN 500 MG tablet  Commonly known as:  GLUCOPHAGE     predniSONE 10 MG tablet  Commonly known as:  STERAPRED UNI-PAK     simvastatin 40 MG tablet  Commonly known as:  ZOCOR     TRIBENZOR 40-5-25 MG Tabs  Generic drug:  Olmesartan-Amlodipine-HCTZ      TAKE these medications       albuterol 108 (90 BASE) MCG/ACT inhaler  Commonly known as:  PROVENTIL HFA;VENTOLIN HFA  Inhale 2 puffs into the lungs every 6 (six) hours as needed for wheezing.     amLODipine-valsartan 10-320 MG per tablet  Commonly known as:  EXFORGE  Take 1 tablet by mouth daily.     aspirin 81 MG chewable tablet  Chew 81 mg by mouth daily.     atorvastatin 20 MG tablet  Commonly known as:  LIPITOR  Take 1 tablet (20 mg total) by mouth daily at 6 PM.     azithromycin 250 MG tablet  Commonly known as:  ZITHROMAX  Take 250 mg by mouth daily.     CALCIUM 600+D PLUS MINERALS 600-400 MG-UNIT Tabs  Take 1 tablet by mouth daily.     carvedilol 25 MG tablet  Commonly known as:  COREG  Take 1 tablet (25 mg total) by mouth 2 (two) times daily with a meal.     D3 SUPER STRENGTH 2000 UNITS Caps  Generic drug:  Cholecalciferol  Take 1 capsule by mouth daily.     furosemide 40 MG tablet  Commonly known as:  LASIX  Take 1 tablet (40 mg total) by mouth 2 (two) times daily.     HYDROcodone-homatropine 5-1.5 MG/5ML syrup  Commonly known as:  HYCODAN  Take 5 mLs by mouth every 6 (six) hours as needed  for cough.     insulin NPH-regular (70-30) 100 UNIT/ML injection  Commonly known as:  NOVOLIN 70/30  Take 15 units in AM and 10 units with dinner     Iron 325 (65 FE) MG Tabs  Take 1 capsule by mouth daily.     isosorbide-hydrALAZINE 20-37.5 MG per tablet  Commonly known as:  BIDIL  Take 2 tablets by mouth 2 (two) times daily.     potassium chloride SA 20 MEQ tablet  Commonly known as:  K-DUR,KLOR-CON  Take 20 mEq by mouth daily.       Follow-up Information   Follow up with [facility]     On 05/31/2013. (Please call and make your appt when discharged )    Contact information:   [address]  [place] [address] [phone]      Follow up with [facility]. (Home Health Physical Therapy )    Contact information:   [phone]      Follow up with Laverda Page, MD. Schedule an appointment as soon as possible for a visit in 1 week.   Specialty:  Cardiology   Contact information:   [address] [place] [address] [phone]       Follow up with Salena Saner., MD. (Monday Nov 10th at 9:30)    Specialty:  Internal Medicine   Contact information:   [address] [place] [address] [phone]       The results of significant diagnostics from this hospitalization (including imaging, microbiology, ancillary and laboratory) are listed below for reference.    Significant Diagnostic Studies: Dg Chest Portable 1 View  05/23/2013   CLINICAL DATA:  Congestive heart failure  EXAM: PORTABLE CHEST - 1 VIEW  COMPARISON:  03/31/2013  FINDINGS: Extensive bilateral interstitial and airspace disease. Chronic cardiopericardial enlargement. There could be small pleural effusions. No pneumothorax.  IMPRESSION: Congestive heart failure with extensive alveolar edema.   Electronically Signed   By: Jorje Guild M.D.   On: 05/23/2013 05:34    Microbiology: Recent Results (from the past 240  hour(s))  MRSA PCR SCREENING     Status: None   Collection Time    05/23/13 11:46 AM      Result Value Range Status   MRSA by PCR NEGATIVE  NEGATIVE Final   Comment:            The GeneXpert MRSA Assay (FDA     approved for NASAL specimens     only), is one component of a     comprehensive MRSA colonization     surveillance program. It is not     intended to diagnose MRSA     infection nor to guide or     monitor treatment for     MRSA infections.     Labs: Basic Metabolic Panel:  Recent Labs Lab 05/28/13 0625 05/29/13 1000 05/30/13 0440 05/31/13 0415 06/01/13 0540  NA 137 132* 134* 137 134*  K 3.5 4.1 3.3* 3.8 3.7  CL 96 91* 90* 91* 89*  CO2 30 29 33* 36* 33*  GLUCOSE 94 138* 228* 122* 188*  BUN 44* 47* 53* 60* 63*  CREATININE 2.03* 1.93* 2.16* 2.27* 2.22*  CALCIUM 8.9 9.0 8.8 9.4 9.6   CBC:  Recent Labs Lab 06/01/13 0540  WBC 7.5  HGB 10.1*  HCT 30.2*  MCV 88.0  PLT 373   BNP: BNP (last 3 results)  Recent Labs  08/23/12 2335 05/23/13 0540 05/29/13 0520  PROBNP 7978.0* 12897.0* 3477.0*   CBG:  Recent Labs Lab 05/31/13 1632 05/31/13 1848 05/31/13 2126 06/01/13 0637 06/01/13 1056  GLUCAP 233* 147* 139* 177* 255*   Signed:  Cruzita Lederer, COSTIN  [facility] 06/01/2013, 2:28 PM

## 2013-07-12 ENCOUNTER — Encounter (HOSPITAL_COMMUNITY): Payer: Self-pay | Admitting: Emergency Medicine

## 2013-07-12 ENCOUNTER — Emergency Department (HOSPITAL_COMMUNITY): Payer: BC Managed Care – PPO

## 2013-07-12 ENCOUNTER — Emergency Department (HOSPITAL_COMMUNITY)
Admission: EM | Admit: 2013-07-12 | Discharge: 2013-07-12 | Disposition: A | Discharge status: Home / Self Care | Payer: BC Managed Care – PPO | Attending: Emergency Medicine | Admitting: Emergency Medicine

## 2013-07-12 DIAGNOSIS — J329 Chronic sinusitis, unspecified: Secondary | ICD-10-CM

## 2013-07-12 DIAGNOSIS — Z7982 Long term (current) use of aspirin: Secondary | ICD-10-CM | POA: Insufficient documentation

## 2013-07-12 DIAGNOSIS — R079 Chest pain, unspecified: Secondary | ICD-10-CM | POA: Insufficient documentation

## 2013-07-12 DIAGNOSIS — Z8659 Personal history of other mental and behavioral disorders: Secondary | ICD-10-CM | POA: Insufficient documentation

## 2013-07-12 DIAGNOSIS — I509 Heart failure, unspecified: Secondary | ICD-10-CM | POA: Insufficient documentation

## 2013-07-12 DIAGNOSIS — Z79899 Other long term (current) drug therapy: Secondary | ICD-10-CM | POA: Insufficient documentation

## 2013-07-12 DIAGNOSIS — E785 Hyperlipidemia, unspecified: Secondary | ICD-10-CM | POA: Insufficient documentation

## 2013-07-12 DIAGNOSIS — IMO0002 Reserved for concepts with insufficient information to code with codable children: Secondary | ICD-10-CM | POA: Insufficient documentation

## 2013-07-12 DIAGNOSIS — E119 Type 2 diabetes mellitus without complications: Secondary | ICD-10-CM | POA: Insufficient documentation

## 2013-07-12 DIAGNOSIS — M1710 Unilateral primary osteoarthritis, unspecified knee: Secondary | ICD-10-CM | POA: Insufficient documentation

## 2013-07-12 DIAGNOSIS — M810 Age-related osteoporosis without current pathological fracture: Secondary | ICD-10-CM | POA: Insufficient documentation

## 2013-07-12 DIAGNOSIS — Z87891 Personal history of nicotine dependence: Secondary | ICD-10-CM | POA: Insufficient documentation

## 2013-07-12 DIAGNOSIS — J45901 Unspecified asthma with (acute) exacerbation: Secondary | ICD-10-CM | POA: Insufficient documentation

## 2013-07-12 DIAGNOSIS — I10 Essential (primary) hypertension: Secondary | ICD-10-CM | POA: Insufficient documentation

## 2013-07-12 DIAGNOSIS — Z8719 Personal history of other diseases of the digestive system: Secondary | ICD-10-CM | POA: Insufficient documentation

## 2013-07-12 DIAGNOSIS — Z794 Long term (current) use of insulin: Secondary | ICD-10-CM | POA: Insufficient documentation

## 2013-07-12 HISTORY — DX: Other seasonal allergic rhinitis: J30.2

## 2013-07-12 HISTORY — DX: Anxiety disorder, unspecified: F41.9

## 2013-07-12 LAB — POCT I-STAT TROPONIN I: Troponin i, poc: 0.01 ng/mL (ref 0.00–0.08)

## 2013-07-12 LAB — BASIC METABOLIC PANEL
BUN: 33 mg/dL — ABNORMAL HIGH (ref 6–23)
CO2: 27 mEq/L (ref 19–32)
Calcium: 9 mg/dL (ref 8.4–10.5)
Chloride: 100 mEq/L (ref 96–112)
Creatinine, Ser: 1.78 mg/dL — ABNORMAL HIGH (ref 0.50–1.10)
GFR calc Af Amer: 34 mL/min — ABNORMAL LOW (ref >90)
GFR calc non Af Amer: 29 mL/min — ABNORMAL LOW (ref >90)
Glucose, Bld: 224 mg/dL — ABNORMAL HIGH (ref 70–99)
Potassium: 3.3 mEq/L — ABNORMAL LOW (ref 3.5–5.1)
Sodium: 137 mEq/L (ref 135–145)

## 2013-07-12 LAB — PRO B NATRIURETIC PEPTIDE: Pro B Natriuretic peptide (BNP): 5576 pg/mL — ABNORMAL HIGH (ref 0–125)

## 2013-07-12 LAB — CBC
HCT: 31.3 % — ABNORMAL LOW (ref 36.0–46.0)
Hemoglobin: 10.4 g/dL — ABNORMAL LOW (ref 12.0–15.0)
MCH: 28.7 pg (ref 26.0–34.0)
MCHC: 33.2 g/dL (ref 30.0–36.0)
MCV: 86.5 fL (ref 78.0–100.0)
Platelets: 360 10*3/uL (ref 150–400)
RBC: 3.62 MIL/uL — ABNORMAL LOW (ref 3.87–5.11)
RDW: 13.3 % (ref 11.5–15.5)
WBC: 7.3 10*3/uL (ref 4.0–10.5)

## 2013-07-12 MED ORDER — AMOXICILLIN-POT CLAVULANATE 875-125 MG PO TABS: 1.0000 | ORAL_TABLET | Freq: Two times a day (BID) | ORAL | Status: DC

## 2013-07-12 NOTE — ED Notes (Signed)
Pt c/o chest tightness with sob x approx 30-45 minutes.  Pt states she has been having problems with allergies (bilateral eye drainage, nose sores, non-productive cough) and started receiving allergy shots 3 weeks ago.  States she was seen at PCP office on Thursday and they were suppose to call Rx in but did not.  States she went back to PCP office on Monday and was started on Z-pack.  Denies nausea and vomiting.

## 2013-07-12 NOTE — ED Provider Notes (Signed)
CSN: [id number]     Arrival date & time 07/12/13  2014 History   First MD Initiated Contact with Patient 07/12/13 2107     Chief Complaint  Patient presents with  . Chest Pain  . Shortness of Breath   (Consider location/radiation/quality/duration/timing/severity/associated sxs/prior Treatment) Patient is a 62 y.o. female presenting with chest pain and shortness of breath. The history is provided by the patient.  Chest Pain Associated symptoms: shortness of breath   Shortness of Breath Associated symptoms: chest pain    patient here complaining of Head congestion with associated nonproductive cough without fever x3 weeks. Symptoms started after she received her allergy shots. Was seen by her physician this week and was started on a Z-Pak and given Tessalon Perles. No relief of her symptoms with this. Denies any anginal type chest pain. Denies any peripheral edema. Symptoms are worse at night but it is unsure if she is having orthopnea. She does have a history of CHF. She also notes actually sinus pain as well as postnasal drip. No visual changes. No severe headaches.  Past Medical History  Diagnosis Date  . Hypertension   . Diverticulitis   . Hyperlipidemia   . Asthma   . Diabetes mellitus     insulin dependent  . Arthritis     knee  . GI bleed 03/31/2013  . CHF (congestive heart failure)   . Osteoporosis   . Seasonal allergies   . Anxiety    Past Surgical History  Procedure Laterality Date  . Abdominal hysterectomy    . Esophagogastroduodenoscopy N/A 03/31/2013    Procedure: ESOPHAGOGASTRODUODENOSCOPY (EGD);  Surgeon: Gatha Mayer, MD;  Location[facility] ENDOSCOPY;  Service: Endoscopy;  Laterality: N/A;   Family History  Problem Relation Age of Onset  . Colon cancer Neg Hx    History  Substance Use Topics  . Smoking status: Former Smoker    Quit date: 05/10/2012  . Smokeless tobacco: Never Used  . Alcohol Use: No   OB History   Grav Para Term Preterm Abortions TAB SAB  Ect Mult Living                 Review of Systems  Respiratory: Positive for shortness of breath.   Cardiovascular: Positive for chest pain.  All other systems reviewed and are negative.    Allergies  Lisinopril  Home Medications   Current Outpatient Rx  Name  Route  Sig  Dispense  Refill  . albuterol (PROVENTIL HFA;VENTOLIN HFA) 108 (90 BASE) MCG/ACT inhaler   Inhalation   Inhale 2 puffs into the lungs every 6 (six) hours as needed for wheezing.         Marland Kitchen amLODipine-valsartan (EXFORGE) 10-320 MG per tablet   Oral   Take 1 tablet by mouth daily.   30 tablet   1   . aspirin 81 MG chewable tablet   Oral   Chew 81 mg by mouth daily.         Marland Kitchen atorvastatin (LIPITOR) 20 MG tablet   Oral   Take 20 mg by mouth daily at 6 PM.         . azithromycin (ZITHROMAX) 250 MG tablet   Oral   Take 250-500 mg by mouth daily. Start 12.15.14 for 5 days, 2 tabs day 1, then 1 tab on day 2-5         . benzonatate (TESSALON) 100 MG capsule   Oral   Take 100 mg by mouth 3 (three) times daily as  needed for cough.         . budesonide-formoterol (SYMBICORT) 160-4.5 MCG/ACT inhaler   Inhalation   Inhale 1 puff into the lungs every evening.         . Calcium Carbonate-Vit D-Min (CALCIUM 600+D PLUS MINERALS) 600-400 MG-UNIT TABS   Oral   Take 1 tablet by mouth daily.         . carvedilol (COREG) 25 MG tablet   Oral   Take 1 tablet (25 mg total) by mouth 2 (two) times daily with a meal.   60 tablet   1   . Cholecalciferol (D3 SUPER STRENGTH) 2000 UNITS CAPS   Oral   Take 1 capsule by mouth daily.         . Ferrous Sulfate (IRON) 325 (65 FE) MG TABS   Oral   Take 1 capsule by mouth daily.         . furosemide (LASIX) 40 MG tablet   Oral   Take 1 tablet (40 mg total) by mouth 2 (two) times daily.   60 tablet   1   . insulin aspart (NOVOLOG) 100 UNIT/ML injection   Subcutaneous   Inject into the skin 3 (three) times daily before meals. Sliding scale           . insulin detemir (LEVEMIR) 100 UNIT/ML injection   Subcutaneous   Inject 10 Units into the skin at bedtime.         . isosorbide-hydrALAZINE (BIDIL) 20-37.5 MG per tablet   Oral   Take 1 tablet by mouth 3 (three) times daily.         . potassium chloride SA (K-DUR,KLOR-CON) 20 MEQ tablet   Oral   Take 20 mEq by mouth 2 (two) times daily.          BP 213/92  Pulse 87  Temp(Src) 97.3 F (36.3 C) (Oral)  Resp 22  Ht 5\' 4"  (1.626 m)  Wt 161 lb 12.8 oz (73.392 kg)  BMI 27.76 kg/m2  SpO2 92% Physical Exam  Nursing note and vitals reviewed. Constitutional: She is oriented to person, place, and time. She appears well-developed and well-nourished.  Non-toxic appearance. No distress.  HENT:  Head: Normocephalic and atraumatic.    Eyes: Conjunctivae, EOM and lids are normal. Pupils are equal, round, and reactive to light.  Neck: Normal range of motion. Neck supple. No tracheal deviation present. No mass present.  Cardiovascular: Normal rate, regular rhythm and normal heart sounds.  Exam reveals no gallop.   No murmur heard. Pulmonary/Chest: Effort normal. No stridor. No respiratory distress. She has no decreased breath sounds. She has no wheezes. She has no rhonchi. She has no rales.  Abdominal: Soft. Normal appearance and bowel sounds are normal. She exhibits no distension. There is no tenderness. There is no rebound and no CVA tenderness.  Musculoskeletal: Normal range of motion. She exhibits no edema and no tenderness.  Neurological: She is alert and oriented to person, place, and time. She has normal strength. No cranial nerve deficit or sensory deficit. GCS eye subscore is 4. GCS verbal subscore is 5. GCS motor subscore is 6.  Skin: Skin is warm and dry. No abrasion and no rash noted.  Psychiatric: She has a normal mood and affect. Her speech is normal and behavior is normal.    ED Course  Procedures (including critical care time) Labs Review Labs Reviewed  CBC -  Abnormal; Notable for the following:    RBC 3.62 (*)  Hemoglobin 10.4 (*)    HCT 31.3 (*)    All other components within normal limits  BASIC METABOLIC PANEL - Abnormal; Notable for the following:    Potassium 3.3 (*)    Glucose, Bld 224 (*)    BUN 33 (*)    Creatinine, Ser 1.78 (*)    GFR calc non Af Amer 29 (*)    GFR calc Af Amer 34 (*)    All other components within normal limits  PRO B NATRIURETIC PEPTIDE - Abnormal; Notable for the following:    Pro B Natriuretic peptide (BNP) 5576.0 (*)    All other components within normal limits  POCT I-STAT TROPONIN I   Imaging Review No results found.  EKG Interpretation    Date/Time:  Wednesday July 12 2013 20:19:07 EST Ventricular Rate:  91 PR Interval:  164 QRS Duration: 82 QT Interval:  388 QTC Calculation: 477 R Axis:   66 Text Interpretation:  Normal sinus rhythm Normal ECG No significant change since last tracing Confirmed by [place] ([id number]) on 07/12/2013 9:08:30 PM            MDM  No diagnosis found. The patient and her normal blood pressure medication this evening a blood pressure has improved. Her CHF has improved to baseline her chest x-ray. She also took her evening dose of Lasix while she was here. Suspect that she probably has sinusitis and will place her on Augmentin. She has no evidence of ACS. She is stable for discharge    Leota Jacobsen, MD 07/12/13 2323

## 2013-07-20 ENCOUNTER — Other Ambulatory Visit: Payer: Self-pay

## 2013-07-20 ENCOUNTER — Inpatient Hospital Stay (HOSPITAL_COMMUNITY)
Admission: EM | Admit: 2013-07-20 | Discharge: 2013-07-23 | DRG: 291 | Disposition: A | Discharge status: Home / Self Care | Payer: BC Managed Care – PPO | Attending: Family Medicine | Admitting: Family Medicine

## 2013-07-20 ENCOUNTER — Emergency Department (HOSPITAL_COMMUNITY): Payer: BC Managed Care – PPO

## 2013-07-20 ENCOUNTER — Encounter (HOSPITAL_COMMUNITY): Payer: Self-pay | Admitting: Emergency Medicine

## 2013-07-20 DIAGNOSIS — E1100 Type 2 diabetes mellitus with hyperosmolarity without nonketotic hyperglycemic-hyperosmolar coma (NKHHC): Secondary | ICD-10-CM

## 2013-07-20 DIAGNOSIS — N058 Unspecified nephritic syndrome with other morphologic changes: Secondary | ICD-10-CM | POA: Diagnosis present

## 2013-07-20 DIAGNOSIS — Z91199 Patient's noncompliance with other medical treatment and regimen due to unspecified reason: Secondary | ICD-10-CM

## 2013-07-20 DIAGNOSIS — I509 Heart failure, unspecified: Secondary | ICD-10-CM

## 2013-07-20 DIAGNOSIS — J309 Allergic rhinitis, unspecified: Secondary | ICD-10-CM

## 2013-07-20 DIAGNOSIS — I161 Hypertensive emergency: Secondary | ICD-10-CM

## 2013-07-20 DIAGNOSIS — Z7982 Long term (current) use of aspirin: Secondary | ICD-10-CM

## 2013-07-20 DIAGNOSIS — E119 Type 2 diabetes mellitus without complications: Secondary | ICD-10-CM

## 2013-07-20 DIAGNOSIS — Z794 Long term (current) use of insulin: Secondary | ICD-10-CM

## 2013-07-20 DIAGNOSIS — I119 Hypertensive heart disease without heart failure: Secondary | ICD-10-CM | POA: Diagnosis present

## 2013-07-20 DIAGNOSIS — M810 Age-related osteoporosis without current pathological fracture: Secondary | ICD-10-CM | POA: Diagnosis present

## 2013-07-20 DIAGNOSIS — E785 Hyperlipidemia, unspecified: Secondary | ICD-10-CM

## 2013-07-20 DIAGNOSIS — K922 Gastrointestinal hemorrhage, unspecified: Secondary | ICD-10-CM

## 2013-07-20 DIAGNOSIS — Z22322 Carrier or suspected carrier of Methicillin resistant Staphylococcus aureus: Secondary | ICD-10-CM

## 2013-07-20 DIAGNOSIS — Z87891 Personal history of nicotine dependence: Secondary | ICD-10-CM

## 2013-07-20 DIAGNOSIS — F411 Generalized anxiety disorder: Secondary | ICD-10-CM | POA: Diagnosis present

## 2013-07-20 DIAGNOSIS — Z9119 Patient's noncompliance with other medical treatment and regimen: Secondary | ICD-10-CM

## 2013-07-20 DIAGNOSIS — I160 Hypertensive urgency: Secondary | ICD-10-CM

## 2013-07-20 DIAGNOSIS — E1169 Type 2 diabetes mellitus with other specified complication: Secondary | ICD-10-CM | POA: Diagnosis present

## 2013-07-20 DIAGNOSIS — I5033 Acute on chronic diastolic (congestive) heart failure: Secondary | ICD-10-CM | POA: Diagnosis present

## 2013-07-20 DIAGNOSIS — N183 Chronic kidney disease, stage 3 unspecified: Secondary | ICD-10-CM

## 2013-07-20 DIAGNOSIS — D631 Anemia in chronic kidney disease: Secondary | ICD-10-CM | POA: Diagnosis present

## 2013-07-20 DIAGNOSIS — E876 Hypokalemia: Secondary | ICD-10-CM | POA: Diagnosis present

## 2013-07-20 DIAGNOSIS — J45909 Unspecified asthma, uncomplicated: Secondary | ICD-10-CM

## 2013-07-20 DIAGNOSIS — I110 Hypertensive heart disease with heart failure: Principal | ICD-10-CM | POA: Diagnosis present

## 2013-07-20 DIAGNOSIS — E1121 Type 2 diabetes mellitus with diabetic nephropathy: Secondary | ICD-10-CM

## 2013-07-20 DIAGNOSIS — J811 Chronic pulmonary edema: Secondary | ICD-10-CM

## 2013-07-20 DIAGNOSIS — I5032 Chronic diastolic (congestive) heart failure: Secondary | ICD-10-CM

## 2013-07-20 DIAGNOSIS — E1129 Type 2 diabetes mellitus with other diabetic kidney complication: Secondary | ICD-10-CM | POA: Diagnosis present

## 2013-07-20 DIAGNOSIS — I10 Essential (primary) hypertension: Secondary | ICD-10-CM

## 2013-07-20 DIAGNOSIS — J9600 Acute respiratory failure, unspecified whether with hypoxia or hypercapnia: Secondary | ICD-10-CM | POA: Diagnosis present

## 2013-07-20 DIAGNOSIS — E1122 Type 2 diabetes mellitus with diabetic chronic kidney disease: Secondary | ICD-10-CM | POA: Diagnosis present

## 2013-07-20 DIAGNOSIS — M129 Arthropathy, unspecified: Secondary | ICD-10-CM | POA: Diagnosis present

## 2013-07-20 LAB — POCT I-STAT 3, ART BLOOD GAS (G3+)
Bicarbonate: 26.2 mEq/L — ABNORMAL HIGH (ref 20.0–24.0)
O2 Saturation: 98 %
Patient temperature: 98.6
TCO2: 28 mmol/L (ref 0–100)
pCO2 arterial: 48.7 mmHg — ABNORMAL HIGH (ref 35.0–45.0)
pH, Arterial: 7.339 — ABNORMAL LOW (ref 7.350–7.450)
pO2, Arterial: 106 mmHg — ABNORMAL HIGH (ref 80.0–100.0)

## 2013-07-20 LAB — CBC
HCT: 31.7 % — ABNORMAL LOW (ref 36.0–46.0)
Hemoglobin: 10.7 g/dL — ABNORMAL LOW (ref 12.0–15.0)
MCH: 29.4 pg (ref 26.0–34.0)
MCHC: 33.8 g/dL (ref 30.0–36.0)
MCV: 87.1 fL (ref 78.0–100.0)
Platelets: 361 10*3/uL (ref 150–400)
RBC: 3.64 MIL/uL — ABNORMAL LOW (ref 3.87–5.11)
RDW: 13.6 % (ref 11.5–15.5)
WBC: 7.8 10*3/uL (ref 4.0–10.5)

## 2013-07-20 LAB — BASIC METABOLIC PANEL
BUN: 25 mg/dL — ABNORMAL HIGH (ref 6–23)
CO2: 24 mEq/L (ref 19–32)
Calcium: 8.8 mg/dL (ref 8.4–10.5)
Chloride: 100 mEq/L (ref 96–112)
Creatinine, Ser: 1.61 mg/dL — ABNORMAL HIGH (ref 0.50–1.10)
GFR calc Af Amer: 39 mL/min — ABNORMAL LOW (ref >90)
GFR calc non Af Amer: 33 mL/min — ABNORMAL LOW (ref >90)
Glucose, Bld: 181 mg/dL — ABNORMAL HIGH (ref 70–99)
Potassium: 3.3 mEq/L — ABNORMAL LOW (ref 3.5–5.1)
Sodium: 137 mEq/L (ref 135–145)

## 2013-07-20 LAB — POCT I-STAT, CHEM 8
BUN: 25 mg/dL — ABNORMAL HIGH (ref 6–23)
Calcium, Ion: 1.17 mmol/L (ref 1.13–1.30)
Chloride: 104 mEq/L (ref 96–112)
Creatinine, Ser: 1.7 mg/dL — ABNORMAL HIGH (ref 0.50–1.10)
Glucose, Bld: 187 mg/dL — ABNORMAL HIGH (ref 70–99)
HCT: 34 % — ABNORMAL LOW (ref 36.0–46.0)
Hemoglobin: 11.6 g/dL — ABNORMAL LOW (ref 12.0–15.0)
Potassium: 3.3 mEq/L — ABNORMAL LOW (ref 3.5–5.1)
Sodium: 139 mEq/L (ref 135–145)
TCO2: 25 mmol/L (ref 0–100)

## 2013-07-20 LAB — POCT I-STAT TROPONIN I: Troponin i, poc: 0.01 ng/mL (ref 0.00–0.08)

## 2013-07-20 MED ORDER — POTASSIUM CHLORIDE CRYS ER 20 MEQ PO TBCR
40.0000 meq | EXTENDED_RELEASE_TABLET | Freq: Once | ORAL | Status: AC
  Administered 2013-07-21: 40 meq via ORAL
  Filled 2013-07-20: qty 2

## 2013-07-20 MED ORDER — NITROGLYCERIN IN D5W 200-5 MCG/ML-% IV SOLN: 2.0000 ug/min | INTRAVENOUS | Status: DC

## 2013-07-20 MED ORDER — NITROGLYCERIN IN D5W 200-5 MCG/ML-% IV SOLN
10.0000 ug/min | INTRAVENOUS | Status: DC
  Administered 2013-07-20: 10 ug/min via INTRAVENOUS

## 2013-07-20 MED ORDER — SODIUM CHLORIDE 0.9 % IV SOLN: 250.0000 mL | INTRAVENOUS | Status: DC | PRN

## 2013-07-20 MED ORDER — HEPARIN SODIUM (PORCINE) 5000 UNIT/ML IJ SOLN
5000.0000 [IU] | Freq: Three times a day (TID) | INTRAMUSCULAR | Status: DC
  Administered 2013-07-21 – 2013-07-23 (×7): 5000 [IU] via SUBCUTANEOUS
  Filled 2013-07-20 (×11): qty 1

## 2013-07-20 MED ORDER — NITROGLYCERIN IN D5W 200-5 MCG/ML-% IV SOLN
INTRAVENOUS | Status: AC
  Filled 2013-07-20: qty 250

## 2013-07-20 MED ORDER — ALBUTEROL SULFATE HFA 108 (90 BASE) MCG/ACT IN AERS: 2.0000 | INHALATION_SPRAY | Freq: Four times a day (QID) | RESPIRATORY_TRACT | Status: DC | PRN

## 2013-07-20 MED ORDER — FUROSEMIDE 10 MG/ML IJ SOLN
80.0000 mg | Freq: Once | INTRAMUSCULAR | Status: AC
  Administered 2013-07-20: 80 mg via INTRAVENOUS
  Filled 2013-07-20: qty 8

## 2013-07-20 MED ORDER — ASPIRIN 300 MG RE SUPP: 300.0000 mg | RECTAL | Status: AC

## 2013-07-20 MED ORDER — SODIUM CHLORIDE 0.9 % IV SOLN
250.0000 mL | INTRAVENOUS | Status: DC | PRN
  Administered 2013-07-21: 10 mL via INTRAVENOUS

## 2013-07-20 MED ORDER — ASPIRIN 81 MG PO CHEW
324.0000 mg | CHEWABLE_TABLET | ORAL | Status: AC
  Administered 2013-07-21: 324 mg via ORAL
  Filled 2013-07-20: qty 4

## 2013-07-20 MED ORDER — SODIUM CHLORIDE 0.9 % IJ SOLN: 3.0000 mL | INTRAMUSCULAR | Status: DC | PRN

## 2013-07-20 MED ORDER — ATORVASTATIN CALCIUM 20 MG PO TABS
20.0000 mg | ORAL_TABLET | Freq: Every day | ORAL | Status: DC
  Administered 2013-07-21 – 2013-07-22 (×2): 20 mg via ORAL
  Filled 2013-07-20 (×3): qty 1

## 2013-07-20 MED ORDER — BUDESONIDE-FORMOTEROL FUMARATE 160-4.5 MCG/ACT IN AERO
1.0000 | INHALATION_SPRAY | Freq: Every evening | RESPIRATORY_TRACT | Status: DC
  Administered 2013-07-22: 1 via RESPIRATORY_TRACT
  Filled 2013-07-20 (×2): qty 6

## 2013-07-20 MED ORDER — SODIUM CHLORIDE 0.9 % IJ SOLN
3.0000 mL | Freq: Two times a day (BID) | INTRAMUSCULAR | Status: DC
  Administered 2013-07-21 (×2): 3 mL via INTRAVENOUS

## 2013-07-20 MED ORDER — PANTOPRAZOLE SODIUM 40 MG PO TBEC
40.0000 mg | DELAYED_RELEASE_TABLET | Freq: Every day | ORAL | Status: DC
  Administered 2013-07-21: 40 mg via ORAL
  Filled 2013-07-20: qty 1

## 2013-07-20 MED ORDER — NITROGLYCERIN IN D5W 200-5 MCG/ML-% IV SOLN: 2.0000 ug/min | Freq: Once | INTRAVENOUS | Status: DC

## 2013-07-20 MED ORDER — CARVEDILOL 25 MG PO TABS
25.0000 mg | ORAL_TABLET | Freq: Two times a day (BID) | ORAL | Status: DC
  Administered 2013-07-21 – 2013-07-23 (×5): 25 mg via ORAL
  Filled 2013-07-20 (×7): qty 1

## 2013-07-20 MED ORDER — FUROSEMIDE 10 MG/ML IJ SOLN
80.0000 mg | Freq: Three times a day (TID) | INTRAMUSCULAR | Status: DC
Start: 2013-07-20 — End: 2013-07-21
  Administered 2013-07-21 (×2): 80 mg via INTRAVENOUS
  Filled 2013-07-20 (×3): qty 8

## 2013-07-20 NOTE — ED Notes (Signed)
Pt's husband reports pt began having SOB tonight.  Pt sent straight back from triage without being triaged to POD A-1.  Pt tripoding, RR >30, RA sats 80's.  EDP [place] to bedside.

## 2013-07-20 NOTE — ED Provider Notes (Addendum)
CSN: [id number]     Arrival date & time 07/20/13  2124 History   First MD Initiated Contact with Patient 07/20/13 2127    Level V caveat: Acute dyspnea, difficulty communicating Chief complaint shortness of breath HPI Patient presents to the emergency room with complaints of acute shortness of breath. The patient has a history of congestive heart failure and malignant hypertension. The patient states today she started having severe difficulty with her breathing. This started somewhat acutely. She denies any chest pain. She denies any fevers. History is primarily obtained by the patient's significant other. The patient has been taking her blood pressure medications. She denies any recent salt intake. Past Medical History  Diagnosis Date  . Hypertension   . Diverticulitis   . Hyperlipidemia   . Asthma   . Diabetes mellitus     insulin dependent  . Arthritis     knee  . GI bleed 03/31/2013  . CHF (congestive heart failure)   . Osteoporosis   . Seasonal allergies   . Anxiety    Past Surgical History  Procedure Laterality Date  . Abdominal hysterectomy    . Esophagogastroduodenoscopy N/A 03/31/2013    Procedure: ESOPHAGOGASTRODUODENOSCOPY (EGD);  Surgeon: Gatha Mayer, MD;  Location: [facility] ENDOSCOPY;  Service: Endoscopy;  Laterality: N/A;   Family History  Problem Relation Age of Onset  . Colon cancer Neg Hx    History  Substance Use Topics  . Smoking status: Former Smoker    Quit date: 05/10/2012  . Smokeless tobacco: Never Used  . Alcohol Use: No   OB History   Grav Para Term Preterm Abortions TAB SAB Ect Mult Living                 Review of Systems  All other systems reviewed and are negative.    Allergies  Lisinopril  Home Medications   Current Outpatient Rx  Name  Route  Sig  Dispense  Refill  . albuterol (PROVENTIL HFA;VENTOLIN HFA) 108 (90 BASE) MCG/ACT inhaler   Inhalation   Inhale 2 puffs into the lungs every 6 (six) hours as needed for wheezing.          Marland Kitchen amLODipine-valsartan (EXFORGE) 10-320 MG per tablet   Oral   Take 1 tablet by mouth daily.   30 tablet   1   . amoxicillin-clavulanate (AUGMENTIN) 875-125 MG per tablet   Oral   Take 1 tablet by mouth 2 (two) times daily.   20 tablet   0   . aspirin 81 MG chewable tablet   Oral   Chew 81 mg by mouth daily.         Marland Kitchen atorvastatin (LIPITOR) 20 MG tablet   Oral   Take 20 mg by mouth daily at 6 PM.         . benzonatate (TESSALON) 100 MG capsule   Oral   Take 100 mg by mouth 3 (three) times daily as needed for cough.         . budesonide-formoterol (SYMBICORT) 160-4.5 MCG/ACT inhaler   Inhalation   Inhale 1 puff into the lungs every evening.         . Calcium Carbonate-Vit D-Min (CALCIUM 600+D PLUS MINERALS) 600-400 MG-UNIT TABS   Oral   Take 1 tablet by mouth daily.         . carvedilol (COREG) 25 MG tablet   Oral   Take 1 tablet (25 mg total) by mouth 2 (two) times daily with a meal.  60 tablet   1   . Cholecalciferol (D3 SUPER STRENGTH) 2000 UNITS CAPS   Oral   Take 1 capsule by mouth daily.         . Ferrous Sulfate (IRON) 325 (65 FE) MG TABS   Oral   Take 1 capsule by mouth daily.         . furosemide (LASIX) 40 MG tablet   Oral   Take 1 tablet (40 mg total) by mouth 2 (two) times daily.   60 tablet   1   . insulin aspart (NOVOLOG) 100 UNIT/ML injection   Subcutaneous   Inject into the skin 3 (three) times daily before meals. Sliding scale         . insulin detemir (LEVEMIR) 100 UNIT/ML injection   Subcutaneous   Inject 10 Units into the skin at bedtime.         . isosorbide-hydrALAZINE (BIDIL) 20-37.5 MG per tablet   Oral   Take 1 tablet by mouth 3 (three) times daily.         . potassium chloride SA (K-DUR,KLOR-CON) 20 MEQ tablet   Oral   Take 20 mEq by mouth 2 (two) times daily.          BP 198/83  Pulse 94  Temp(Src) 97.6 F (36.4 C) (Axillary)  Resp 24  SpO2 100% Physical Exam  Nursing note and vitals  reviewed. Constitutional: She appears distressed.  HENT:  Head: Normocephalic and atraumatic.  Right Ear: External ear normal.  Left Ear: External ear normal.  Mouth/Throat: No oropharyngeal exudate.  Eyes: Conjunctivae are normal. Right eye exhibits no discharge. Left eye exhibits no discharge. No scleral icterus.  Neck: Neck supple. No tracheal deviation present.  Cardiovascular: Regular rhythm and intact distal pulses.  Tachycardia present.   Pulmonary/Chest: Accessory muscle usage present. No stridor. Tachypnea noted. She is in respiratory distress. She has no wheezes. She has rales.  Coughing up clear frothy sputum  Abdominal: Soft. Bowel sounds are normal. She exhibits no distension. There is no tenderness. There is no rebound and no guarding.  Musculoskeletal: She exhibits no edema and no tenderness.  Neurological: She is alert. She has normal strength. No sensory deficit. Cranial nerve deficit:  no gross defecits noted. She exhibits normal muscle tone. She displays no seizure activity. Coordination normal.  Skin: Skin is warm. No rash noted. She is diaphoretic.  Psychiatric: She has a normal mood and affect.    ED Course  Procedures  IV Insertion US guided IV placed Left AC.  20 gauge needle inserted by me.  Blood drawn back, flushed easily.    Bipap initiated 2258  Pt continues to improve.  BP has been improving and pt is breathing more easily.  Tolerating Bipap  CRITICAL CARE Performed by: Kathalene Frames Total critical care time: 45 Critical care time was exclusive of separately billable procedures and treating other patients. Critical care was necessary to treat or prevent imminent or life-threatening deterioration. Critical care was time spent personally by me on the following activities: development of treatment plan with patient and/or surrogate as well as nursing, discussions with consultants, evaluation of patient's response to treatment, examination of patient, obtaining  history from patient or surrogate, ordering and performing treatments and interventions, ordering and review of laboratory studies, ordering and review of radiographic studies, pulse oximetry and re-evaluation of patient's condition.   Labs Review Labs Reviewed  BASIC METABOLIC PANEL - Abnormal; Notable for the following:    Potassium 3.3 (*)  Glucose, Bld 181 (*)    BUN 25 (*)    Creatinine, Ser 1.61 (*)    GFR calc non Af Amer 33 (*)    GFR calc Af Amer 39 (*)    All other components within normal limits  CBC - Abnormal; Notable for the following:    RBC 3.64 (*)    Hemoglobin 10.7 (*)    HCT 31.7 (*)    All other components within normal limits  POCT I-STAT 3, BLOOD GAS (G3+) - Abnormal; Notable for the following:    pH, Arterial 7.339 (*)    pCO2 arterial 48.7 (*)    pO2, Arterial 106.0 (*)    Bicarbonate 26.2 (*)    All other components within normal limits  POCT I-STAT, CHEM 8 - Abnormal; Notable for the following:    Potassium 3.3 (*)    BUN 25 (*)    Creatinine, Ser 1.70 (*)    Glucose, Bld 187 (*)    Hemoglobin 11.6 (*)    HCT 34.0 (*)    All other components within normal limits  PRO B NATRIURETIC PEPTIDE  POCT I-STAT TROPONIN I   Imaging Review Dg Chest Port 1 View  07/20/2013   CLINICAL DATA:  Shortness of breath.  EXAM: PORTABLE CHEST - 1 VIEW  COMPARISON:  07/12/2013  FINDINGS: Moderate interstitial edema/ CHF present. There are likely small bilateral pleural effusions. Stable cardiomegaly present.  IMPRESSION: Moderate CHF.   Electronically Signed   By: Aletta Edouard M.D.   On: 07/20/2013 21:58    EKG Interpretation    Date/Time:  Thursday July 20 2013 21:50:54 EST Ventricular Rate:  132 PR Interval:  116 QRS Duration: 98 QT Interval:  338 QTC Calculation: 501 R Axis:   51 Text Interpretation:  Age not entered, assumed to be  62 years old for purpose of ECG interpretation Sinus tachycardia Probable anteroseptal infarct, old Repol abnrm suggests  ischemia, lateral leads, new since last tracing Prolonged QT interval Confirmed by Lemond Griffee  MD-J, Adamariz Gillott ([id number]) on 07/20/2013 10:10:40 PM            Repeat EKG at 2317 Sinus rhythm rate 84 Normal axis, normal intervals Borderline prolonged QT Improved ST-T wave Medications  nitroGLYCERIN 0.2 mg/mL in dextrose 5 % infusion (10 mcg/min Intravenous New Bag/Given 07/20/13 2157)  furosemide (LASIX) injection 80 mg (80 mg Intravenous Given 07/20/13 2214)     MDM   1. Acute congestive heart failure   2. Hypertensive emergency    Patient has an unfortunate history of recurrent episodes of hypertensive emergency and flash pulmonary edema. Patient had a recurrent episode today. She was emergently treated with BiPAP and a nitroglycerin infusion. Patient has improved significantly. I will consult with the critical care service regarding admission. The patient does seem to be responding well to treatment.   Kathalene Frames, MD 07/20/13 Momence Damiana Berrian, MD 07/20/13 [phone]

## 2013-07-21 ENCOUNTER — Inpatient Hospital Stay (HOSPITAL_COMMUNITY): Payer: BC Managed Care – PPO

## 2013-07-21 DIAGNOSIS — I10 Essential (primary) hypertension: Secondary | ICD-10-CM

## 2013-07-21 LAB — GLUCOSE, CAPILLARY
Glucose-Capillary: 114 mg/dL — ABNORMAL HIGH (ref 70–99)
Glucose-Capillary: 153 mg/dL — ABNORMAL HIGH (ref 70–99)
Glucose-Capillary: 204 mg/dL — ABNORMAL HIGH (ref 70–99)
Glucose-Capillary: 205 mg/dL — ABNORMAL HIGH (ref 70–99)
Glucose-Capillary: 34 mg/dL — CL (ref 70–99)
Glucose-Capillary: 78 mg/dL (ref 70–99)
Glucose-Capillary: 88 mg/dL (ref 70–99)

## 2013-07-21 LAB — URINE MICROSCOPIC-ADD ON

## 2013-07-21 LAB — URINALYSIS, ROUTINE W REFLEX MICROSCOPIC
Bilirubin Urine: NEGATIVE
Glucose, UA: 100 mg/dL — AB
Ketones, ur: NEGATIVE mg/dL
Leukocytes, UA: NEGATIVE
Nitrite: NEGATIVE
Protein, ur: >300 mg/dL — AB
Specific Gravity, Urine: 1.009 (ref 1.005–1.030)
Urobilinogen, UA: 0.2 mg/dL (ref 0.0–1.0)
pH: 5.5 (ref 5.0–8.0)

## 2013-07-21 LAB — TROPONIN I
Troponin I: <0.3 ng/mL (ref <0.30)
Troponin I: <0.3 ng/mL (ref <0.30)
Troponin I: <0.3 ng/mL (ref <0.30)

## 2013-07-21 LAB — PRO B NATRIURETIC PEPTIDE: Pro B Natriuretic peptide (BNP): 7797 pg/mL — ABNORMAL HIGH (ref 0–125)

## 2013-07-21 LAB — PROTIME-INR
INR: 0.91 (ref 0.00–1.49)
Prothrombin Time: 12.1 seconds (ref 11.6–15.2)

## 2013-07-21 LAB — MRSA PCR SCREENING: MRSA by PCR: POSITIVE — AB

## 2013-07-21 LAB — APTT: aPTT: 30 seconds (ref 24–37)

## 2013-07-21 MED ORDER — HYDRALAZINE HCL 20 MG/ML IJ SOLN
10.0000 mg | INTRAMUSCULAR | Status: DC | PRN
  Administered 2013-07-21: 20 mg via INTRAVENOUS
  Administered 2013-07-23: 10 mg via INTRAVENOUS
  Filled 2013-07-21 (×2): qty 1

## 2013-07-21 MED ORDER — INSULIN DETEMIR 100 UNIT/ML ~~LOC~~ SOLN
10.0000 [IU] | Freq: Every day | SUBCUTANEOUS | Status: DC
  Administered 2013-07-21 – 2013-07-22 (×3): 10 [IU] via SUBCUTANEOUS
  Filled 2013-07-21 (×4): qty 0.1

## 2013-07-21 MED ORDER — INSULIN ASPART 100 UNIT/ML ~~LOC~~ SOLN
0.0000 [IU] | SUBCUTANEOUS | Status: DC
  Administered 2013-07-21: 3 [IU] via SUBCUTANEOUS
  Administered 2013-07-21 (×2): 5 [IU] via SUBCUTANEOUS

## 2013-07-21 MED ORDER — BIOTENE DRY MOUTH MT LIQD
15.0000 mL | Freq: Two times a day (BID) | OROMUCOSAL | Status: DC
  Administered 2013-07-21: 15 mL via OROMUCOSAL

## 2013-07-21 MED ORDER — CHLORHEXIDINE GLUCONATE CLOTH 2 % EX PADS: 6.0000 | MEDICATED_PAD | Freq: Every day | CUTANEOUS | Status: DC

## 2013-07-21 MED ORDER — FUROSEMIDE 10 MG/ML IJ SOLN
40.0000 mg | Freq: Two times a day (BID) | INTRAMUSCULAR | Status: DC
  Administered 2013-07-22 – 2013-07-23 (×3): 40 mg via INTRAVENOUS
  Filled 2013-07-21 (×4): qty 4

## 2013-07-21 MED ORDER — MUPIROCIN 2 % EX OINT
1.0000 "application " | TOPICAL_OINTMENT | Freq: Two times a day (BID) | CUTANEOUS | Status: DC
  Administered 2013-07-21 – 2013-07-23 (×5): 1 via NASAL
  Filled 2013-07-21: qty 22

## 2013-07-21 MED ORDER — HYDRALAZINE HCL 25 MG PO TABS
25.0000 mg | ORAL_TABLET | Freq: Three times a day (TID) | ORAL | Status: DC
  Administered 2013-07-21 – 2013-07-23 (×7): 25 mg via ORAL
  Filled 2013-07-21 (×10): qty 1

## 2013-07-21 MED ORDER — CHLORHEXIDINE GLUCONATE CLOTH 2 % EX PADS
6.0000 | MEDICATED_PAD | Freq: Every day | CUTANEOUS | Status: DC
  Administered 2013-07-21: 6 via TOPICAL

## 2013-07-21 MED ORDER — AMLODIPINE BESYLATE 10 MG PO TABS
10.0000 mg | ORAL_TABLET | Freq: Every day | ORAL | Status: DC
  Administered 2013-07-21 – 2013-07-23 (×3): 10 mg via ORAL
  Filled 2013-07-21 (×4): qty 1

## 2013-07-21 MED ORDER — INSULIN ASPART 100 UNIT/ML ~~LOC~~ SOLN
0.0000 [IU] | Freq: Three times a day (TID) | SUBCUTANEOUS | Status: DC
Start: 2013-07-22 — End: 2013-07-23
  Administered 2013-07-22 – 2013-07-23 (×2): 3 [IU] via SUBCUTANEOUS

## 2013-07-21 MED ORDER — ASPIRIN 81 MG PO CHEW
81.0000 mg | CHEWABLE_TABLET | Freq: Every day | ORAL | Status: DC
  Administered 2013-07-21 – 2013-07-23 (×3): 81 mg via ORAL
  Filled 2013-07-21: qty 1

## 2013-07-21 MED ORDER — GUAIFENESIN 100 MG/5ML PO SYRP
200.0000 mg | ORAL_SOLUTION | ORAL | Status: DC | PRN
  Administered 2013-07-21 – 2013-07-23 (×3): 200 mg via ORAL
  Filled 2013-07-21 (×3): qty 10

## 2013-07-21 NOTE — Progress Notes (Signed)
CRITICAL VALUE ALERT  Critical value received:  CBG 34   Date of notification: 07/21/2013  Time of notification:  7:18 AM  Critical value read back:yes  Nurse who received alert:  BShepherd RN  MD notified (1st page):  Zubelevistey  Time of first page:  7:19 AM  MD notified (2nd page):  Time of second page:  Responding MD:  Lauris Chroman  Time MD responded:  7:20 AM

## 2013-07-21 NOTE — Progress Notes (Signed)
Pt received as transfer from the unit. In Isolation for pos MRSA family and pt educated.. VS done and placed on telemetry. Alert/oriented oriented to room pt was accompanied by daughter and husband.

## 2013-07-21 NOTE — Progress Notes (Signed)
Utilization review completed. Tiwan Schnitker, RN, BSN. 

## 2013-07-21 NOTE — H&P (Signed)
Name: Destiny Day MRN: [id number] DOB: Sep 26, 1950    ADMISSION DATE:  07/20/2013  REFERRING MD :  Tomi Bamberger, ED PRIMARY SERVICE: PCCM PCP: Salena Saner., MD Cards - ganji  CHIEF COMPLAINT:  resp distress  BRIEF PATIENT DESCRIPTION: 5 th admission last 34months for this 62 y.o , Htn, DM-2, CKD-3  with hypertensive crisis & acute pulmonary edema ,  Adm to ICU on CPAP, NTG gtt  SIGNIFICANT EVENTS / STUDIES:  Echo 04/2013 - gr 2 diast dysfn  LINES / TUBES:   CULTURES:   ANTIBIOTICS:   HISTORY OF PRESENT ILLNESS:  5 th admission last 52months for this 62 y.o , Htn, DM-2 with hypertensive crisis & acute pulmonary edema , poor compliance with meds. Adm to ICU on CPAP, NTG gtt BIBEMS with sudden  Onset resp distress, BP 164/146 on arrival, improved with bipap Lasi adm 04/2013  Saw PCP & Cards within last week - Bp 160 range, sugars were OK, claims compliance with meds, has generic for exforge Denies blurred vision, headache or hematuria, c/o  dyspnea, lower extremity edema,inspite of taking lasix bid    PAST MEDICAL HISTORY :  Past Medical History  Diagnosis Date  . Hypertension   . Diverticulitis   . Hyperlipidemia   . Asthma   . Diabetes mellitus     insulin dependent  . Arthritis     knee  . GI bleed 03/31/2013  . CHF (congestive heart failure)   . Osteoporosis   . Seasonal allergies   . Anxiety    Past Surgical History  Procedure Laterality Date  . Abdominal hysterectomy    . Esophagogastroduodenoscopy N/A 03/31/2013    Procedure: ESOPHAGOGASTRODUODENOSCOPY (EGD);  Surgeon: Gatha Mayer, MD;  Location: [facility] ENDOSCOPY;  Service: Endoscopy;  Laterality: N/A;   Prior to Admission medications   Medication Sig Start Date End Date Taking? Authorizing Provider  albuterol (PROVENTIL HFA;VENTOLIN HFA) 108 (90 BASE) MCG/ACT inhaler Inhale 2 puffs into the lungs every 6 (six) hours as needed for wheezing.   Yes Historical Provider, MD  amLODipine-valsartan  (EXFORGE) 10-320 MG per tablet Take 1 tablet by mouth daily. 06/01/13  Yes Costin Karlyne Greenspan, MD  amoxicillin-clavulanate (AUGMENTIN) 875-125 MG per tablet Take 1 tablet by mouth 2 (two) times daily. 07/12/13  Yes Leota Jacobsen, MD  aspirin 81 MG chewable tablet Chew 81 mg by mouth daily.   Yes Historical Provider, MD  atorvastatin (LIPITOR) 20 MG tablet Take 20 mg by mouth daily at 6 PM.   Yes Historical Provider, MD  benzonatate (TESSALON) 100 MG capsule Take 100 mg by mouth 3 (three) times daily as needed for cough.   Yes Historical Provider, MD  budesonide-formoterol (SYMBICORT) 160-4.5 MCG/ACT inhaler Inhale 1 puff into the lungs every evening.   Yes Historical Provider, MD  Calcium Carbonate-Vit D-Min (CALCIUM 600+D PLUS MINERALS) 600-400 MG-UNIT TABS Take 1 tablet by mouth daily.   Yes Historical Provider, MD  carvedilol (COREG) 25 MG tablet Take 1 tablet (25 mg total) by mouth 2 (two) times daily with a meal. 06/01/13  Yes Costin Karlyne Greenspan, MD  Cholecalciferol (D3 SUPER STRENGTH) 2000 UNITS CAPS Take 1 capsule by mouth daily.   Yes Historical Provider, MD  Ferrous Sulfate (IRON) 325 (65 FE) MG TABS Take 1 capsule by mouth daily.   Yes Historical Provider, MD  furosemide (LASIX) 40 MG tablet Take 1 tablet (40 mg total) by mouth 2 (two) times daily. 06/01/13  Yes Costin Karlyne Greenspan, MD  insulin  aspart (NOVOLOG) 100 UNIT/ML injection Inject into the skin 3 (three) times daily before meals. Sliding scale   Yes Historical Provider, MD  insulin detemir (LEVEMIR) 100 UNIT/ML injection Inject 10 Units into the skin at bedtime.   Yes Historical Provider, MD  isosorbide-hydrALAZINE (BIDIL) 20-37.5 MG per tablet Take 1 tablet by mouth 3 (three) times daily.   Yes Historical Provider, MD  potassium chloride SA (K-DUR,KLOR-CON) 20 MEQ tablet Take 20 mEq by mouth 2 (two) times daily.   Yes Historical Provider, MD   Allergies  Allergen Reactions  . Lisinopril Cough    FAMILY HISTORY:  Family History    Problem Relation Age of Onset  . Colon cancer Neg Hx    SOCIAL HISTORY:  reports that she quit smoking about 14 months ago. She has never used smokeless tobacco. She reports that she does not drink alcohol or use illicit drugs.  REVIEW OF SYSTEMS:  Constitutional: negative for anorexia, fevers and sweats  Eyes: negative for irritation, redness and visual disturbance  Ears, nose, mouth, throat, and face: negative for earaches, epistaxis, nasal congestion and sore throat  Respiratory: negative for cough, sputum and wheezing  Cardiovascular: negative for chest pain, orthopnea, palpitations and syncope  Gastrointestinal: negative for abdominal pain, constipation, diarrhea, melena, nausea and vomiting  Genitourinary:negative for dysuria, frequency and hematuria  Hematologic/lymphatic: negative for bleeding, easy bruising and lymphadenopathy  Musculoskeletal:negative for arthralgias, muscle weakness and stiff joints  Neurological: negative for coordination problems, gait problems, headaches and weakness  Endocrine: negative for diabetic symptoms including polydipsia, polyuria and weight loss   SUBJECTIVE:   VITAL SIGNS: Temp:  [97.6 F (36.4 C)-98 F (36.7 C)] 98 F (36.7 C) (12/26 0004) Pulse Rate:  [79-143] 79 (12/25 2345) Resp:  [18-33] 18 (12/25 2345) BP: (164-294)/(79-194) 195/86 mmHg (12/25 2345) SpO2:  [95 %-100 %] 97 % (12/25 2345) HEMODYNAMICS:   VENTILATOR SETTINGS:   INTAKE / OUTPUT: Intake/Output   None     PHYSICAL EXAMINATION: Gen. Pleasant, well-nourished, in no distress, anxious affect ENT - no lesions, no post nasal drip Neck: No JVD, no thyromegaly, no carotid bruits Lungs: no use of accessory muscles, no dullness to percussion, bibasal rales , no rhonchi  Cardiovascular: Rhythm regular, heart sounds  normal, no murmurs, no peripheral edema Abdomen: soft and non-tender, no hepatosplenomegaly, BS normal. Musculoskeletal: No deformities, no cyanosis or  clubbing Neuro:  alert, non focal Skin:  Warm, no lesions/ rash   LABS:  CBC  Recent Labs Lab 07/20/13 2210 07/20/13 2229  WBC 7.8  --   HGB 10.7* 11.6*  HCT 31.7* 34.0*  PLT 361  --    Coag's No results found for this basename: APTT, INR,  in the last 168 hours BMET  Recent Labs Lab 07/20/13 2210 07/20/13 2229  NA 137 139  K 3.3* 3.3*  CL 100 104  CO2 24  --   BUN 25* 25*  CREATININE 1.61* 1.70*  GLUCOSE 181* 187*   Electrolytes  Recent Labs Lab 07/20/13 2210  CALCIUM 8.8   Sepsis Markers No results found for this basename: LATICACIDVEN, PROCALCITON, O2SATVEN,  in the last 168 hours ABG  Recent Labs Lab 07/20/13 2201  PHART 7.339*  PCO2ART 48.7*  PO2ART 106.0*   Liver Enzymes No results found for this basename: AST, ALT, ALKPHOS, BILITOT, ALBUMIN,  in the last 168 hours Cardiac Enzymes  Recent Labs Lab 07/20/13 2319 07/20/13 2335  TROPONINI <0.30  --   PROBNP  --  7797.0*   Glucose  No results found for this basename: GLUCAP,  in the last 168 hours  Imaging Dg Chest Port 1 View  07/20/2013   CLINICAL DATA:  Shortness of breath.  EXAM: PORTABLE CHEST - 1 VIEW  COMPARISON:  07/12/2013  FINDINGS: Moderate interstitial edema/ CHF present. There are likely small bilateral pleural effusions. Stable cardiomegaly present.  IMPRESSION: Moderate CHF.   Electronically Signed   By: Aletta Edouard M.D.   On: 07/20/2013 21:58     CXR: pulm edema  ASSESSMENT / PLAN:  PULMONARY A:Acute resp failure due to acute pulm edema P:   Bipap prn - ok to take off & place on [place] Consider sleep study as outpt given difficult to control htn requiring > 3 meds-high pre test prob  CARDIOVASCULAR A: Acute pulm edema Diastolic CHF Hypertensive emergency P:  Lasix 80 q 8h x 3 doses , then reduce Resume coreg, amlodipin NTG gtt  Hydralazine po 25 tid & titrate, IV prn Aim for MAP 100-110 range next 12 h, use cardene gtt if unable to control with NTG alone or  if limited by headache but doubt will be needed  RENAL A:  CKD- 3 P:   Was on exforge -prefer to avoid valsartan Consider wu for secondary causes - renal US to r/o RAS, urine for pheo?   GASTROINTESTINAL A:  No issues P:   Resume diet if stays off bipap by am  HEMATOLOGIC A:  Anemia of CKD P:  Not a candidate for epo  INFECTIOUS A:  No issues P:     ENDOCRINE A:  DM-2, poorly controlled with complications   P:   SSI + lantus 5    NEUROLOGIC A:  No issues P:     TODAY'S SUMMARY: Aim for MAP 100 acutely & normalise after 12h - consider wu for secondary causes & call cards in am. Try to get off drips by starting PO hydralazine & nitrates  I have personally obtained a history, examined the patient, evaluated laboratory and imaging results, formulated the assessment and plan and placed orders. CRITICAL CARE: The patient is critically ill with multiple organ systems failure and requires high complexity decision making for assessment and support, frequent evaluation and titration of therapies, application of advanced monitoring technologies and extensive interpretation of multiple databases. Critical Care Time devoted to patient care services described in this note is 60  minutes.   Rigoberto Noel  Pulmonary and [place] Pager: [phone]  07/21/2013, 12:28 AM

## 2013-07-21 NOTE — H&P (Signed)
PULMONARY / CRITICAL CARE MEDICINE  Name: Destiny Day MRN: [id number] DOB: 08/27/1950    ADMISSION DATE:  07/20/2013  REFERRING MD :  EDP PRIMARY SERVICE: PCCM  CHIEF COMPLAINT:  Acute respiratory failure  BRIEF PATIENT DESCRIPTION: 62 yo with CKD, HTN and chronic diastolic heart failure ( multiple admissions for hypertensive emergency since 6 months ago ) admitted with hypertensive emergency, pulmonary edema and acute respiratory failure requiring BiPAP and Nitroglycerin gtt.  SIGNIFICANT EVENTS / STUDIES:  12/26  Renal artery Doppler >>>  LINES / TUBES:  CULTURES:  ANTIBIOTICS:  SUBJECTIVE: Denies headache and dyspnea. Off BiPAP / Nitroglycerin gtt  VITAL SIGNS: Temp:  [97.6 F (36.4 C)-98.3 F (36.8 C)] 98 F (36.7 C) (12/26 1153) Pulse Rate:  [70-143] 74 (12/26 1430) Resp:  [13-33] 20 (12/26 1430) BP: (132-294)/(54-194) 159/60 mmHg (12/26 1430) SpO2:  [90 %-100 %] 96 % (12/26 1430) Weight:  [70.4 kg (155 lb 3.3 oz)] 70.4 kg (155 lb 3.3 oz) (12/26 0500)  HEMODYNAMICS:   VENTILATOR SETTINGS:   INTAKE / OUTPUT: Intake/Output     12/25 0701 - 12/26 0700 12/26 0701 - 12/27 0700   P.O. 30    I.V. (mL/kg) 116.3 (1.7) 74.3 (1.1)   Total Intake(mL/kg) 146.3 (2.1) 74.3 (1.1)   Urine (mL/kg/hr) 800 350 (0.6)   Total Output 800 350   Net -653.7 -275.7          PHYSICAL EXAMINATION: General:  No distress, resting comfortable breathing ambient air Neuro:  Awake, alert, cooperative HEENT:  PERRL Cardiovascular:  RRR, no m/r/g Lungs:  Clear to ausculation Abdomen:  Soft, nontender, bowel sounds normal Musculoskeletal:  Moves all extremities, no edema Skin:  Intact  LABS:  CBC  Recent Labs Lab 07/20/13 2210 07/20/13 2229  WBC 7.8  --   HGB 10.7* 11.6*  HCT 31.7* 34.0*  PLT 361  --    Coag's  Recent Labs Lab 07/20/13 2335  APTT 30  INR 0.91   BMET  Recent Labs Lab 07/20/13 2210 07/20/13 2229  NA 137 139  K 3.3* 3.3*  CL 100 104  CO2  24  --   BUN 25* 25*  CREATININE 1.61* 1.70*  GLUCOSE 181* 187*   Electrolytes  Recent Labs Lab 07/20/13 2210  CALCIUM 8.8   Sepsis Markers No results found for this basename: LATICACIDVEN, PROCALCITON, O2SATVEN,  in the last 168 hours ABG  Recent Labs Lab 07/20/13 2201  PHART 7.339*  PCO2ART 48.7*  PO2ART 106.0*   Liver Enzymes No results found for this basename: AST, ALT, ALKPHOS, BILITOT, ALBUMIN,  in the last 168 hours Cardiac Enzymes  Recent Labs Lab 07/20/13 2319 07/20/13 2335 07/21/13 0445 07/21/13 1220  TROPONINI <0.30  --  <0.30 <0.30  PROBNP  --  7797.0*  --   --    Glucose  Recent Labs Lab 07/21/13 0030 07/21/13 0404 07/21/13 0713 07/21/13 0845 07/21/13 1153  GLUCAP 205* 153* 34* 78 114*   CXR: 12/26 >>> Resolving bilateral airspace disease, effusions  ASSESSMENT / PLAN:  PULMONARY A: Acute resp failure in setting of acute pulmonary edema. Reactive airway disease? P:   Goal SpO2>92 Supplemental oxygen PRN D/c BiPAP Consider sleep study as outpt given difficult to control hypertension Albuterol / Symbicort   CARDIOVASCULAR A:  Acute on chronic diastolic heart failure.  Hypertensive emergency. Discussed with Dr.Ganji - patient is noncompliant with medical regiment. P:  Goal BP<160/90 D/c Nitroglycerin gtt ASA, Lipitor Coreg, Norvasc, Hydralazine Hydralazine PRN Renal US Would not pursue  any further workup if normotensive in controlled setting  RENAL A:  CKD. Hypokalemia. P:   Was on exforge - prefer to avoid valsartan K 40 x 1 last night Trend BMP Lasix decrease to 40 q12h  GASTROINTESTINAL A:  No active issues. P:   Cardiac diet D/c Protonix - no indications  HEMATOLOGIC A:  Anemia of CKD. P:  Trend CBC  INFECTIOUS A:  No active issues. P:   No intervention required  ENDOCRINE A:  DM2. Hyperglycemia. P:   SSI Levemir 5  NEUROLOGIC A:  No active issues. P: No intervention required  Transfer to  telemetry. TRH to assume care 12/27.  I have personally obtained history, examined patient, evaluated and interpreted laboratory and imaging results, reviewed medical records, formulated assessment / plan and placed orders.  CRITICAL CARE:  The patient is critically ill with multiple organ systems failure and requires high complexity decision making for assessment and support, frequent evaluation and titration of therapies, application of advanced monitoring technologies and extensive interpretation of multiple databases. Critical Care Time devoted to patient care services described in this note is 30 minutes.   Doree Fudge, MD Pulmonary and [place] Pager: [phone]  07/21/2013, 4:18 PM

## 2013-07-21 NOTE — Progress Notes (Addendum)
Bilateral renal artery duplex completed.  No evidence of significant renal artery stenosis.  Abnormal intrarenal resistive indices.

## 2013-07-22 DIAGNOSIS — N183 Chronic kidney disease, stage 3 unspecified: Secondary | ICD-10-CM

## 2013-07-22 DIAGNOSIS — I10 Essential (primary) hypertension: Secondary | ICD-10-CM

## 2013-07-22 DIAGNOSIS — I5032 Chronic diastolic (congestive) heart failure: Secondary | ICD-10-CM

## 2013-07-22 DIAGNOSIS — J9600 Acute respiratory failure, unspecified whether with hypoxia or hypercapnia: Secondary | ICD-10-CM

## 2013-07-22 DIAGNOSIS — I509 Heart failure, unspecified: Secondary | ICD-10-CM

## 2013-07-22 DIAGNOSIS — I119 Hypertensive heart disease without heart failure: Secondary | ICD-10-CM

## 2013-07-22 DIAGNOSIS — I5033 Acute on chronic diastolic (congestive) heart failure: Secondary | ICD-10-CM

## 2013-07-22 LAB — GLUCOSE, CAPILLARY
Glucose-Capillary: 118 mg/dL — ABNORMAL HIGH (ref 70–99)
Glucose-Capillary: 152 mg/dL — ABNORMAL HIGH (ref 70–99)
Glucose-Capillary: 181 mg/dL — ABNORMAL HIGH (ref 70–99)
Glucose-Capillary: 158 mg/dL — ABNORMAL HIGH (ref 70–99)

## 2013-07-22 MED ORDER — SPIRONOLACTONE 12.5 MG HALF TABLET
12.5000 mg | ORAL_TABLET | Freq: Every day | ORAL | Status: DC
  Administered 2013-07-22 – 2013-07-23 (×2): 12.5 mg via ORAL
  Filled 2013-07-22 (×2): qty 1

## 2013-07-22 NOTE — Progress Notes (Signed)
[facility] PROGRESS NOTE  Destiny Day [id number] DOB: 1950-11-26 DOA: 07/20/2013 PCP: Salena Saner., MD  Assessment/Plan: 1. Acute respiratory failure - Resolved and most likely 2ary to acute on chronic diastolic HF - improved with improvement in blood pressure control and Lasix administration per notes from PCCM  2. Malignant Htn - patient will require further work up for 2ary causes of hypertension like hyperaldosteronism, sleep apnea, or pheochromocytoma (last one least likely given no reports in HPI suspicious for this) - Add spironolactone, +/- imdur depending on blood pressure values on spironolactone  3. Diastolic HF - Will transition to oral lasix regimen today 07/22/13  4. HPL - Continue statin  5. DM - diabetic diet - continue Levemir and SSI while in house.   Code Status: full Family Communication: Discussed with patient and husband. Spent more than 25 minutes explaining condition and treatment. Disposition Plan: d/c home with improvement in BP's   Consultants:  PCCM initially involved  Procedures:  none  Antibiotics:  None  HPI/Subjective: Pt feels better today. Requesting discharge home. PCCM did not feel comfortable discharging home due to elevated blood pressures.  Patient aware and agreeable.  Objective: Filed Vitals:   07/22/13 1300  BP: 176/70  Pulse: 82  Temp: 98 F (36.7 C)  Resp: 20    Intake/Output Summary (Last 24 hours) at 07/22/13 1419 Last data filed at 07/22/13 1300  Gross per 24 hour  Intake   1020 ml  Output   1700 ml  Net   -680 ml   Filed Weights   07/21/13 0500 07/21/13 1834 07/22/13 0525  Weight: 70.4 kg (155 lb 3.3 oz) 71.5 kg (157 lb 10.1 oz) 71.532 kg (157 lb 11.2 oz)    Exam:   General:  Pt in NAD, Alert and awake  Cardiovascular: RRR, no MRG  Respiratory: CTA BL, no wheezes, no increased WOB, comfortable on room air.  Abdomen: soft, NT, ND  Musculoskeletal: no cyanosis or  clubbing   Data Reviewed: Basic Metabolic Panel:  Recent Labs Lab 07/20/13 2210 07/20/13 2229  NA 137 139  K 3.3* 3.3*  CL 100 104  CO2 24  --   GLUCOSE 181* 187*  BUN 25* 25*  CREATININE 1.61* 1.70*  CALCIUM 8.8  --    Liver Function Tests: No results found for this basename: AST, ALT, ALKPHOS, BILITOT, PROT, ALBUMIN,  in the last 168 hours No results found for this basename: LIPASE, AMYLASE,  in the last 168 hours No results found for this basename: AMMONIA,  in the last 168 hours CBC:  Recent Labs Lab 07/20/13 2210 07/20/13 2229  WBC 7.8  --   HGB 10.7* 11.6*  HCT 31.7* 34.0*  MCV 87.1  --   PLT 361  --    Cardiac Enzymes:  Recent Labs Lab 07/20/13 2319 07/21/13 0445 07/21/13 1220  TROPONINI <0.30 <0.30 <0.30   BNP (last 3 results)  Recent Labs  05/29/13 0520 07/12/13 2035 07/20/13 2335  PROBNP 3477.0* 5576.0* 7797.0*   CBG:  Recent Labs Lab 07/21/13 0845 07/21/13 1153 07/21/13 1524 07/21/13 2035 07/22/13 0655  GLUCAP 78 114* 204* 88 118*    Recent Results (from the past 240 hour(s))  MRSA PCR SCREENING     Status: Abnormal   Collection Time    07/21/13 12:28 AM      Result Value Range Status   MRSA by PCR POSITIVE (*) NEGATIVE Final   Comment:  The GeneXpert MRSA Assay (FDA     approved for NASAL specimens     only), is one component of a     comprehensive MRSA colonization     surveillance program. It is not     intended to diagnose MRSA     infection nor to guide or     monitor treatment for     MRSA infections.     RESULT CALLED TO, READ BACK BY AND VERIFIED WITH:     CALLED TO RN Izola Price [facility] [id number] @0449  THANEY     Studies: Dg Chest Port 1 View  07/21/2013   CLINICAL DATA:  62 year old female shortness of breath. Acute respiratory failure. Initial encounter.  EXAM: PORTABLE CHEST - 1 VIEW  COMPARISON:  07/20/2013 and earlier.  FINDINGS: Portable AP semi upright view at 0551 hrs. Stable lung volumes. Stable  cardiac size and mediastinal contours. No pneumothorax. Mild veiling opacity at both lung bases. Interval decreased widespread interstitial opacity. Residual infrahilar streaky opacity. No definite consolidation.  IMPRESSION: Interval decreased interstitial opacity with mild residual streaky and veiling bibasilar. Favor regression of pulmonary edema with residual small effusions and atelectasis.   Electronically Signed   By: Lars Pinks M.D.   On: 07/21/2013 08:03   Dg Chest Port 1 View  07/20/2013   CLINICAL DATA:  Shortness of breath.  EXAM: PORTABLE CHEST - 1 VIEW  COMPARISON:  07/12/2013  FINDINGS: Moderate interstitial edema/ CHF present. There are likely small bilateral pleural effusions. Stable cardiomegaly present.  IMPRESSION: Moderate CHF.   Electronically Signed   By: Aletta Edouard M.D.   On: 07/20/2013 21:58    Scheduled Meds: . amLODipine  10 mg Oral Daily  . aspirin  81 mg Oral Daily  . atorvastatin  20 mg Oral q1800  . budesonide-formoterol  1 puff Inhalation QPM  . carvedilol  25 mg Oral BID WC  . Chlorhexidine Gluconate Cloth  6 each Topical Q0600  . furosemide  40 mg Intravenous Q12H  . heparin  5,000 Units Subcutaneous Q8H  . hydrALAZINE  25 mg Oral TID  . insulin aspart  0-15 Units Subcutaneous TID AC  . insulin detemir  10 Units Subcutaneous QHS  . mupirocin ointment  1 application Nasal BID   Continuous Infusions:    Time spent: > 45 minutes    Velvet Bathe  [facility] Pager [phone] If 7PM-7AM, please contact night-coverage at [email or web address], password [facility] 07/22/2013, 2:19 PM  LOS: 2 days

## 2013-07-22 NOTE — Progress Notes (Signed)
Pt systolic BP elevated. MAP=107. Pt asymptomatic no c/o chest pains nor SOB. Will recheck BP manually after giving !o PM meds. Continued to observe closely.

## 2013-07-23 DIAGNOSIS — E785 Hyperlipidemia, unspecified: Secondary | ICD-10-CM

## 2013-07-23 LAB — BASIC METABOLIC PANEL
BUN: 34 mg/dL — ABNORMAL HIGH (ref 6–23)
CO2: 25 mEq/L (ref 19–32)
Calcium: 8.6 mg/dL (ref 8.4–10.5)
Chloride: 100 mEq/L (ref 96–112)
Creatinine, Ser: 1.63 mg/dL — ABNORMAL HIGH (ref 0.50–1.10)
GFR calc Af Amer: 38 mL/min — ABNORMAL LOW (ref >90)
GFR calc non Af Amer: 33 mL/min — ABNORMAL LOW (ref >90)
Glucose, Bld: 91 mg/dL (ref 70–99)
Potassium: 3.2 mEq/L — ABNORMAL LOW (ref 3.5–5.1)
Sodium: 137 mEq/L (ref 135–145)

## 2013-07-23 LAB — GLUCOSE, CAPILLARY
Glucose-Capillary: 78 mg/dL (ref 70–99)
Glucose-Capillary: 166 mg/dL — ABNORMAL HIGH (ref 70–99)

## 2013-07-23 MED ORDER — SPIRONOLACTONE 25 MG PO TABS
25.0000 mg | ORAL_TABLET | Freq: Every day | ORAL | Status: DC
Start: 2013-07-23 — End: 2013-07-23
  Administered 2013-07-23: 25 mg via ORAL
  Filled 2013-07-23: qty 1

## 2013-07-23 MED ORDER — ISOSORB DINITRATE-HYDRALAZINE 20-37.5 MG PO TABS
1.0000 | ORAL_TABLET | Freq: Three times a day (TID) | ORAL | Status: DC
  Filled 2013-07-23 (×2): qty 1

## 2013-07-23 MED ORDER — SPIRONOLACTONE 25 MG PO TABS: 25.0000 mg | ORAL_TABLET | Freq: Every day | ORAL | Status: DC

## 2013-07-23 MED ORDER — ISOSORB DINITRATE-HYDRALAZINE 20-37.5 MG PO TABS
1.0000 | ORAL_TABLET | Freq: Three times a day (TID) | ORAL | Status: DC
  Administered 2013-07-23: 1 via ORAL
  Filled 2013-07-23 (×3): qty 1

## 2013-07-23 MED ORDER — AMLODIPINE BESYLATE 10 MG PO TABS: 10.0000 mg | ORAL_TABLET | Freq: Every day | ORAL | Status: DC

## 2013-07-23 NOTE — Discharge Summary (Signed)
Physician Discharge Summary  Destiny Day [id number] DOB: 1951/07/15 DOA: 07/20/2013  PCP: Salena Saner., MD  Admit date: 07/20/2013 Discharge date: 07/23/2013  Time spent: > 35 minutes  Recommendations for Outpatient Follow-up:  1. Please be sure to followup with your primary care physician in one week 2. Consider reassessing serum creatinine and potassium levels 3. Encourage compliance with new antihypertensive regimen. 4. Reassess blood pressures and adjust antihypertensive medication as needed. 5. Consider further workup for secondary hypertension such as hyperaldosterone anemia,bilateral renal artery stenosis, sleep apnea, pheochromocytoma, etc.  Discharge Diagnoses:  Active Problems:   HYPERLIPIDEMIA   Diastolic CHF, acute on chronic   HTN (hypertension), malignant   Acute respiratory failure   Type 2 diabetes mellitus with diabetic nephropathy   Hypertensive heart disease   Discharge Condition: stable  Diet recommendation: Low sodium/heart healthy/diabetic diet  Filed Weights   07/21/13 1834 07/22/13 0525 07/23/13 0530  Weight: 71.5 kg (157 lb 10.1 oz) 71.532 kg (157 lb 11.2 oz) 70.716 kg (155 lb 14.4 oz)    History of present illness:  Patient is a 62 year old with chronic kidney disease, hypertension, diastolic heart failure with multiple hospital admissions secondary to hypertensive emergency, who presented to the hospital in acute respiratory failure with pulmonary edema requiring BiPAP and nitroglycerin drip.  Hospital Course:  1. Acute respiratory failure - Resolved and most likely 2ary to acute on chronic diastolic HF secondary to uncontrolled hypertension - improved with improvement in blood pressure control and Lasix administration per notes from PCCM  - as mentioned above initially patient had to be admitted by critical care and patient required BiPAP and nitroglycerin drip of which she was able to be weaned off.  2. Malignant Htn  -  patient will require further work up for 2ary causes of hypertension like hyperaldosteronism, sleep apnea, or pheochromocytoma, etc. We'll defer to outpatient primary care physician - Add spironolactone and Bidil, last blood pressureof 142/58  3. Diastolic HF  - currently compensated we'll continue home medication  4. HPL  - Continue statin   5. DM  - diabetic diet  - continue home regimen  Procedures:  none  Consultations:  Critical care  Discharge Exam: Filed Vitals:   07/23/13 0530  BP: 188/76  Pulse: 76  Temp: 98 F (36.7 C)  Resp: 18    General: Pt in NAD, alert and awake Cardiovascular: RRR, no MRG Respiratory: CTA BL, no wheezes, no increased wob, breathing comfortably on room air.  Discharge Instructions  Discharge Orders   Future Orders Complete By Expires   Call MD for:  difficulty breathing, headache or visual disturbances  As directed    Call MD for:  extreme fatigue  As directed    Call MD for:  temperature >100.4  As directed    Diet - low sodium heart healthy  As directed    Discharge instructions  As directed    Comments:     Please take your antihypertensive medication as recommended. Also you're in need to followup with your primary care physician within the next week. I would recommend having your potassium levels rechecked at that time.   Increase activity slowly  As directed        Medication List    STOP taking these medications       amLODipine-valsartan 10-320 MG per tablet  Commonly known as:  EXFORGE     amoxicillin-clavulanate 875-125 MG per tablet  Commonly known as:  AUGMENTIN      TAKE these medications  albuterol 108 (90 BASE) MCG/ACT inhaler  Commonly known as:  PROVENTIL HFA;VENTOLIN HFA  Inhale 2 puffs into the lungs every 6 (six) hours as needed for wheezing.     amLODipine 10 MG tablet  Commonly known as:  NORVASC  Take 1 tablet (10 mg total) by mouth daily.     aspirin 81 MG chewable tablet  Chew 81 mg  by mouth daily.     atorvastatin 20 MG tablet  Commonly known as:  LIPITOR  Take 20 mg by mouth daily at 6 PM.     benzonatate 100 MG capsule  Commonly known as:  TESSALON  Take 100 mg by mouth 3 (three) times daily as needed for cough.     budesonide-formoterol 160-4.5 MCG/ACT inhaler  Commonly known as:  SYMBICORT  Inhale 1 puff into the lungs every evening.     CALCIUM 600+D PLUS MINERALS 600-400 MG-UNIT Tabs  Take 1 tablet by mouth daily.     carvedilol 25 MG tablet  Commonly known as:  COREG  Take 1 tablet (25 mg total) by mouth 2 (two) times daily with a meal.     D3 SUPER STRENGTH 2000 UNITS Caps  Generic drug:  Cholecalciferol  Take 1 capsule by mouth daily.     furosemide 40 MG tablet  Commonly known as:  LASIX  Take 1 tablet (40 mg total) by mouth 2 (two) times daily.     insulin aspart 100 UNIT/ML injection  Commonly known as:  novoLOG  Inject into the skin 3 (three) times daily before meals. Sliding scale     insulin detemir 100 UNIT/ML injection  Commonly known as:  LEVEMIR  Inject 10 Units into the skin at bedtime.     Iron 325 (65 FE) MG Tabs  Take 1 capsule by mouth daily.     isosorbide-hydrALAZINE 20-37.5 MG per tablet  Commonly known as:  BIDIL  Take 1 tablet by mouth 3 (three) times daily.     potassium chloride SA 20 MEQ tablet  Commonly known as:  K-DUR,KLOR-CON  Take 20 mEq by mouth 2 (two) times daily.     spironolactone 25 MG tablet  Commonly known as:  ALDACTONE  Take 1 tablet (25 mg total) by mouth daily.       Allergies  Allergen Reactions  . Lisinopril Cough      The results of significant diagnostics from this hospitalization (including imaging, microbiology, ancillary and laboratory) are listed below for reference.    Significant Diagnostic Studies: Dg Chest 2 View  07/12/2013   CLINICAL DATA:  Chest tightness, shortness of breath, history hypertension, diabetes, asthma, CHF, former smoker  EXAM: CHEST  2 VIEW   COMPARISON:  05/23/2013  FINDINGS: Enlargement of cardiac silhouette with pulmonary vascular congestion.  Atherosclerotic calcification aorta.  Scattered interstitial prominence improved since previous exam compatible with improved failure.  Underlying emphysematous and bronchitic changes.  No pleural effusion, segmental consolidation or pneumothorax.  Diffuse osseous demineralization.  IMPRESSION: Improved CHF.  Question underlying COPD.   Electronically Signed   By: Lavonia Dana M.D.   On: 07/12/2013 22:03   Dg Chest Port 1 View  07/21/2013   CLINICAL DATA:  62 year old female shortness of breath. Acute respiratory failure. Initial encounter.  EXAM: PORTABLE CHEST - 1 VIEW  COMPARISON:  07/20/2013 and earlier.  FINDINGS: Portable AP semi upright view at 0551 hrs. Stable lung volumes. Stable cardiac size and mediastinal contours. No pneumothorax. Mild veiling opacity at both lung bases. Interval decreased widespread  interstitial opacity. Residual infrahilar streaky opacity. No definite consolidation.  IMPRESSION: Interval decreased interstitial opacity with mild residual streaky and veiling bibasilar. Favor regression of pulmonary edema with residual small effusions and atelectasis.   Electronically Signed   By: Lars Pinks M.D.   On: 07/21/2013 08:03   Dg Chest Port 1 View  07/20/2013   CLINICAL DATA:  Shortness of breath.  EXAM: PORTABLE CHEST - 1 VIEW  COMPARISON:  07/12/2013  FINDINGS: Moderate interstitial edema/ CHF present. There are likely small bilateral pleural effusions. Stable cardiomegaly present.  IMPRESSION: Moderate CHF.   Electronically Signed   By: Aletta Edouard M.D.   On: 07/20/2013 21:58    Microbiology: Recent Results (from the past 240 hour(s))  MRSA PCR SCREENING     Status: Abnormal   Collection Time    07/21/13 12:28 AM      Result Value Range Status   MRSA by PCR POSITIVE (*) NEGATIVE Final   Comment:            The GeneXpert MRSA Assay (FDA     approved for NASAL  specimens     only), is one component of a     comprehensive MRSA colonization     surveillance program. It is not     intended to diagnose MRSA     infection nor to guide or     monitor treatment for     MRSA infections.     RESULT CALLED TO, READ BACK BY AND VERIFIED WITH:     CALLED TO RN [facility] [id number] @0449  THANEY     Labs: Basic Metabolic Panel:  Recent Labs Lab 07/20/13 2210 07/20/13 2229 07/23/13 0641  NA 137 139 137  K 3.3* 3.3* 3.2*  CL 100 104 100  CO2 24  --  25  GLUCOSE 181* 187* 91  BUN 25* 25* 34*  CREATININE 1.61* 1.70* 1.63*  CALCIUM 8.8  --  8.6   Liver Function Tests: No results found for this basename: AST, ALT, ALKPHOS, BILITOT, PROT, ALBUMIN,  in the last 168 hours No results found for this basename: LIPASE, AMYLASE,  in the last 168 hours No results found for this basename: AMMONIA,  in the last 168 hours CBC:  Recent Labs Lab 07/20/13 2210 07/20/13 2229  WBC 7.8  --   HGB 10.7* 11.6*  HCT 31.7* 34.0*  MCV 87.1  --   PLT 361  --    Cardiac Enzymes:  Recent Labs Lab 07/20/13 2319 07/21/13 0445 07/21/13 1220  TROPONINI <0.30 <0.30 <0.30   BNP: BNP (last 3 results)  Recent Labs  05/29/13 0520 07/12/13 2035 07/20/13 2335  PROBNP 3477.0* 5576.0* 7797.0*   CBG:  Recent Labs Lab 07/22/13 1041 07/22/13 1547 07/22/13 2115 07/23/13 0551 07/23/13 1037  GLUCAP 152* 181* 158* 78 166*       Signed:  Ota Ebersole  [facility] 07/23/2013, 2:49 PM

## 2013-07-26 LAB — ALDOSTERONE + RENIN ACTIVITY W/ RATIO
ALDO / PRA Ratio: 27.3 Ratio (ref 0.9–28.9)
Aldosterone: 3 ng/dL
PRA LC/MS/MS: 0.11 ng/mL/h — ABNORMAL LOW (ref 0.25–5.82)

## 2013-09-08 ENCOUNTER — Emergency Department (HOSPITAL_COMMUNITY)
Admission: EM | Admit: 2013-09-08 | Discharge: 2013-09-08 | Disposition: A | Discharge status: Home / Self Care | Payer: BC Managed Care – PPO | Attending: Emergency Medicine | Admitting: Emergency Medicine

## 2013-09-08 ENCOUNTER — Encounter (HOSPITAL_COMMUNITY): Payer: Self-pay | Admitting: Emergency Medicine

## 2013-09-08 ENCOUNTER — Emergency Department (HOSPITAL_COMMUNITY): Payer: BC Managed Care – PPO

## 2013-09-08 DIAGNOSIS — IMO0002 Reserved for concepts with insufficient information to code with codable children: Secondary | ICD-10-CM | POA: Insufficient documentation

## 2013-09-08 DIAGNOSIS — Z794 Long term (current) use of insulin: Secondary | ICD-10-CM | POA: Insufficient documentation

## 2013-09-08 DIAGNOSIS — I10 Essential (primary) hypertension: Secondary | ICD-10-CM | POA: Insufficient documentation

## 2013-09-08 DIAGNOSIS — E785 Hyperlipidemia, unspecified: Secondary | ICD-10-CM | POA: Insufficient documentation

## 2013-09-08 DIAGNOSIS — I509 Heart failure, unspecified: Secondary | ICD-10-CM | POA: Insufficient documentation

## 2013-09-08 DIAGNOSIS — Z79899 Other long term (current) drug therapy: Secondary | ICD-10-CM | POA: Insufficient documentation

## 2013-09-08 DIAGNOSIS — Z7982 Long term (current) use of aspirin: Secondary | ICD-10-CM | POA: Insufficient documentation

## 2013-09-08 DIAGNOSIS — J45901 Unspecified asthma with (acute) exacerbation: Secondary | ICD-10-CM | POA: Insufficient documentation

## 2013-09-08 DIAGNOSIS — Z8719 Personal history of other diseases of the digestive system: Secondary | ICD-10-CM | POA: Insufficient documentation

## 2013-09-08 DIAGNOSIS — F411 Generalized anxiety disorder: Secondary | ICD-10-CM | POA: Insufficient documentation

## 2013-09-08 DIAGNOSIS — M810 Age-related osteoporosis without current pathological fracture: Secondary | ICD-10-CM | POA: Insufficient documentation

## 2013-09-08 DIAGNOSIS — M1710 Unilateral primary osteoarthritis, unspecified knee: Secondary | ICD-10-CM | POA: Insufficient documentation

## 2013-09-08 DIAGNOSIS — Z87891 Personal history of nicotine dependence: Secondary | ICD-10-CM | POA: Insufficient documentation

## 2013-09-08 DIAGNOSIS — E119 Type 2 diabetes mellitus without complications: Secondary | ICD-10-CM | POA: Insufficient documentation

## 2013-09-08 LAB — CBC
HCT: 32.2 % — ABNORMAL LOW (ref 36.0–46.0)
Hemoglobin: 10.7 g/dL — ABNORMAL LOW (ref 12.0–15.0)
MCH: 28.4 pg (ref 26.0–34.0)
MCHC: 33.2 g/dL (ref 30.0–36.0)
MCV: 85.4 fL (ref 78.0–100.0)
Platelets: 324 10*3/uL (ref 150–400)
RBC: 3.77 MIL/uL — ABNORMAL LOW (ref 3.87–5.11)
RDW: 13.5 % (ref 11.5–15.5)
WBC: 6.6 10*3/uL (ref 4.0–10.5)

## 2013-09-08 LAB — BASIC METABOLIC PANEL
BUN: 30 mg/dL — ABNORMAL HIGH (ref 6–23)
CO2: 27 mEq/L (ref 19–32)
Calcium: 8.5 mg/dL (ref 8.4–10.5)
Chloride: 100 mEq/L (ref 96–112)
Creatinine, Ser: 1.95 mg/dL — ABNORMAL HIGH (ref 0.50–1.10)
GFR calc Af Amer: 31 mL/min — ABNORMAL LOW (ref >90)
GFR calc non Af Amer: 26 mL/min — ABNORMAL LOW (ref >90)
Glucose, Bld: 146 mg/dL — ABNORMAL HIGH (ref 70–99)
Potassium: 3.7 mEq/L (ref 3.7–5.3)
Sodium: 139 mEq/L (ref 137–147)

## 2013-09-08 LAB — PRO B NATRIURETIC PEPTIDE: Pro B Natriuretic peptide (BNP): 10751 pg/mL — ABNORMAL HIGH (ref 0–125)

## 2013-09-08 LAB — POCT I-STAT TROPONIN I: Troponin i, poc: 0.03 ng/mL (ref 0.00–0.08)

## 2013-09-08 MED ORDER — MOMETASONE FUROATE 50 MCG/ACT NA SUSP: 2.0000 | Freq: Every day | NASAL | Status: DC

## 2013-09-08 MED ORDER — CARVEDILOL 25 MG PO TABS
25.0000 mg | ORAL_TABLET | Freq: Two times a day (BID) | ORAL | Status: DC
  Administered 2013-09-08: 25 mg via ORAL
  Filled 2013-09-08 (×2): qty 1

## 2013-09-08 MED ORDER — ISOSORB DINITRATE-HYDRALAZINE 20-37.5 MG PO TABS
1.0000 | ORAL_TABLET | Freq: Three times a day (TID) | ORAL | Status: DC
  Administered 2013-09-08: 1 via ORAL
  Filled 2013-09-08: qty 1

## 2013-09-08 NOTE — ED Notes (Signed)
Patient transported to X-ray 

## 2013-09-08 NOTE — Discharge Instructions (Signed)
Heart Failure Heart failure means your heart has trouble pumping blood. This makes it hard for your body to work well. Heart failure is usually a long-term (chronic) condition. You must take good care of yourself and follow your doctor's treatment plan. HOME CARE  Take your heart medicine as told by your doctor.  Do not stop taking medicine unless your doctor tells you to.  Do not skip any dose of medicine.  Refill your medicines before they run out.  Take other medicines only as told by your doctor or pharmacist.  Stay active if told by your doctor. The elderly and people with severe heart failure should talk with a doctor about physical activity.  Eat heart healthy foods. Choose foods that are without trans fat and are low in saturated fat, cholesterol, and salt (sodium). This includes fresh or frozen fruits and vegetables, fish, lean meats, fat-free or low-fat dairy foods, whole grains, and high-fiber foods. Lentils and dried peas and beans (legumes) are also good choices.  Limit salt if told by your doctor.  Cook in a healthy way. Roast, grill, broil, bake, poach, steam, or stir-fry foods.  Limit fluids as told by your doctor.  Weigh yourself every morning. Do this after you pee (urinate) and before you eat breakfast. Write down your weight to give to your doctor.  Take your blood pressure and write it down if your doctor tell you to.  Ask your doctor how to check your pulse. Check your pulse as told.  Lose weight if told by your doctor.  Stop smoking or chewing tobacco. Do not use gum or patches that help you quit without your doctor's approval.  Schedule and go to doctor visits as told.  Nonpregnant women should have no more than 1 drink a day. Men should have no more than 2 drinks a day. Talk to your doctor about drinking alcohol.  Stop illegal drug use.  Stay current with shots (immunizations).  Manage your health conditions as told by your doctor.  Learn to manage  your stress.  Rest when you are tired.  If it is really hot outside:  Avoid intense activities.  Use air conditioning or fans, or get in a cooler place.  Avoid caffeine and alcohol.  Wear loose-fitting, lightweight, and light-colored clothing.  If it is really cold outside:  Avoid intense activities.  Layer your clothing.  Wear mittens or gloves, a hat, and a scarf when going outside.  Avoid alcohol.  Learn about heart failure and get support as needed.  Get help to maintain or improve your quality of life and your ability to care for yourself as needed. GET HELP IF:   You gain 03 lb/1.4 kg or more in 1 day or 05 lb/2.3 kg in a week.  You are more short of breath than usual.  You cannot do your normal activities.  You tire easily.  You cough more than normal, especially with activity.  You have any or more puffiness (swelling) in areas such as your hands, feet, ankles, or belly (abdomen).  You cannot sleep because it is hard to breathe.  You feel like your heart is beating fast (palpitations).  You get dizzy or lightheaded when you stand up. GET HELP RIGHT AWAY IF:   You have trouble breathing.  There is a change in mental status, such as becoming less alert or not being able to focus.  You have chest pain or discomfort.  You faint. MAKE SURE YOU:   Understand these   instructions.  Will watch your condition.  Will get help right away if you are not doing well or get worse. Document Released: 04/21/2008 Document Revised: 11/07/2012 Document Reviewed: 02/11/2012 [employer] Patient Information 2014 [employer].  

## 2013-09-08 NOTE — ED Notes (Signed)
Pt has had a nonproductive cough for a couple of days. Husband concerned that pt may be in fluid overload from CHF. Pt has no edema in extremities.

## 2013-09-08 NOTE — ED Provider Notes (Addendum)
CSN: [id number]     Arrival date & time 09/08/13  1858 History   First MD Initiated Contact with Patient 09/08/13 2003     Chief Complaint  Patient presents with  . Nasal Congestion  . Shortness of Breath     (Consider location/radiation/quality/duration/timing/severity/associated sxs/prior Treatment) HPI Comments: Pt came in with her husband as he felt over the last 24hrs she had had more trouble with her breathing.  Pt states she always has nasal congestion and a dry cough and that she feels the same as normal.  She denies SOB, wheezing, fever, productive cough, chest pain or swelling.  She is taking all her medication as prescribed.  She is hTN here and states she had a salty meal for lunch and is due for her night meds.  The history is provided by the patient and the spouse.    Past Medical History  Diagnosis Date  . Hypertension   . Diverticulitis   . Hyperlipidemia   . Asthma   . Diabetes mellitus     insulin dependent  . Arthritis     knee  . GI bleed 03/31/2013  . CHF (congestive heart failure)   . Osteoporosis   . Seasonal allergies   . Anxiety    Past Surgical History  Procedure Laterality Date  . Abdominal hysterectomy    . Esophagogastroduodenoscopy N/A 03/31/2013    Procedure: ESOPHAGOGASTRODUODENOSCOPY (EGD);  Surgeon: Gatha Mayer, MD;  Location: [facility] ENDOSCOPY;  Service: Endoscopy;  Laterality: N/A;   Family History  Problem Relation Age of Onset  . Colon cancer Neg Hx    History  Substance Use Topics  . Smoking status: Former Smoker    Quit date: 05/10/2012  . Smokeless tobacco: Never Used  . Alcohol Use: No   OB History   Grav Para Term Preterm Abortions TAB SAB Ect Mult Living                 Review of Systems  HENT: Positive for postnasal drip, rhinorrhea and sinus pressure.   Respiratory: Positive for cough. Negative for shortness of breath.   All other systems reviewed and are negative.      Allergies  Lisinopril and  Prednisone  Home Medications   Current Outpatient Rx  Name  Route  Sig  Dispense  Refill  . albuterol (PROVENTIL HFA;VENTOLIN HFA) 108 (90 BASE) MCG/ACT inhaler   Inhalation   Inhale 2 puffs into the lungs every 6 (six) hours as needed for wheezing.         Marland Kitchen amLODipine (NORVASC) 10 MG tablet   Oral   Take 1 tablet (10 mg total) by mouth daily.   30 tablet   0   . aspirin 81 MG chewable tablet   Oral   Chew 81 mg by mouth daily.         Marland Kitchen atorvastatin (LIPITOR) 20 MG tablet   Oral   Take 20 mg by mouth daily at 6 PM.         . budesonide-formoterol (SYMBICORT) 160-4.5 MCG/ACT inhaler   Inhalation   Inhale 1 puff into the lungs every evening.         . Calcium Carbonate-Vit D-Min (CALCIUM 600+D PLUS MINERALS) 600-400 MG-UNIT TABS   Oral   Take 1 tablet by mouth daily.         . carvedilol (COREG) 25 MG tablet   Oral   Take 1 tablet (25 mg total) by mouth 2 (two) times daily with a  meal.   60 tablet   1   . Cholecalciferol (D3 SUPER STRENGTH) 2000 UNITS CAPS   Oral   Take 1 capsule by mouth daily.         . Ferrous Sulfate (IRON) 325 (65 FE) MG TABS   Oral   Take 1 capsule by mouth daily.         . furosemide (LASIX) 40 MG tablet   Oral   Take 1 tablet (40 mg total) by mouth 2 (two) times daily.   60 tablet   1   . insulin aspart (NOVOLOG) 100 UNIT/ML injection   Subcutaneous   Inject 0-8 Units into the skin 3 (three) times daily before meals. Sliding scale         . insulin detemir (LEVEMIR) 100 UNIT/ML injection   Subcutaneous   Inject 10 Units into the skin at bedtime.         . isosorbide-hydrALAZINE (BIDIL) 20-37.5 MG per tablet   Oral   Take 1 tablet by mouth 3 (three) times daily.         Marland Kitchen losartan (COZAAR) 25 MG tablet   Oral   Take 25 mg by mouth daily.          . potassium chloride SA (K-DUR,KLOR-CON) 20 MEQ tablet   Oral   Take 20 mEq by mouth 2 (two) times daily.         Marland Kitchen spironolactone (ALDACTONE) 25 MG  tablet   Oral   Take 1 tablet (25 mg total) by mouth daily.   30 tablet   0    BP 196/94  Pulse 77  Temp(Src) 97.8 F (36.6 C) (Oral)  Resp 19  Ht 5\' 4"  (1.626 m)  Wt 149 lb (67.586 kg)  BMI 25.56 kg/m2  SpO2 100% Physical Exam  Nursing note and vitals reviewed. Constitutional: She is oriented to person, place, and time. She appears well-developed and well-nourished. No distress.  HENT:  Head: Normocephalic and atraumatic.  Nose: Mucosal edema present. Right sinus exhibits no maxillary sinus tenderness and no frontal sinus tenderness. Left sinus exhibits no maxillary sinus tenderness and no frontal sinus tenderness.  Mouth/Throat: Oropharynx is clear and moist.  Swollen nasal turbinates bilaterally with dried blood and superficial ulcers  Eyes: Conjunctivae and EOM are normal. Pupils are equal, round, and reactive to light.  Neck: Normal range of motion. Neck supple.  Cardiovascular: Normal rate, regular rhythm and intact distal pulses.   No murmur heard. Pulmonary/Chest: Effort normal and breath sounds normal. No respiratory distress. She has no wheezes. She has no rales.  Abdominal: Soft. She exhibits no distension. There is no tenderness. There is no rebound and no guarding.  Musculoskeletal: Normal range of motion. She exhibits no edema and no tenderness.  Neurological: She is alert and oriented to person, place, and time.  Skin: Skin is warm and dry. No rash noted. No erythema.  Psychiatric: She has a normal mood and affect. Her behavior is normal.    ED Course  Procedures (including critical care time) Labs Review Labs Reviewed  CBC - Abnormal; Notable for the following:    RBC 3.77 (*)    Hemoglobin 10.7 (*)    HCT 32.2 (*)    All other components within normal limits  BASIC METABOLIC PANEL - Abnormal; Notable for the following:    Glucose, Bld 146 (*)    BUN 30 (*)    Creatinine, Ser 1.95 (*)    GFR calc non Af Amer 26 (*)  GFR calc Af Amer 31 (*)    All  other components within normal limits  PRO B NATRIURETIC PEPTIDE - Abnormal; Notable for the following:    Pro B Natriuretic peptide (BNP) 10751.0 (*)    All other components within normal limits  POCT I-STAT TROPONIN I   Imaging Review Dg Chest 2 View  09/08/2013   CLINICAL DATA:  Cough, short of breath, congestion  EXAM: CHEST  2 VIEW  COMPARISON:  Prior chest x-ray 07/21/2013  FINDINGS: Stable cardiomegaly. Ectatic and atherosclerotic thoracic aorta. Prominence of right paratracheal soft tissues are similar compared to prior and favored to reflect underlying vascular structures. Diffuse chronic bronchitic changes, interstitial prominence and bilateral upper lung emphysematous changes suggest underlying COPD. No pneumothorax or pleural effusion. No edema. Degenerative changes noted in the right shoulder joint. No acute osseous abnormality.  IMPRESSION: 1. Background changes suggest COPD and emphysema. 2. No acute cardiopulmonary process by conventional radiography. 3. Stable cardiomegaly without evidence of edema.   Electronically Signed   By: Jacqulynn Cadet M.D.   On: 09/08/2013 20:52    EKG Interpretation    Date/Time:  Friday September 08 2013 19:04:26 EST Ventricular Rate:  85 PR Interval:  154 QRS Duration: 80 QT Interval:  394 QTC Calculation: 468 R Axis:   22 Text Interpretation:  Normal sinus rhythm Septal infarct , age undetermined ST \\T \ T wave abnormality, consider inferolateral ischemia No significant change since last tracing Confirmed by Maryan Rued  MD, Zyair Russi ([id number]) on 09/08/2013 8:44:13 PM            MDM   Final diagnoses:  Congestive heart failure    Patient brought in by her husband because he felt that she was having difficulty breathing. Patient states that she has constant allergies with nasal congestion and drainage and a dry cough but she denies worsening shortness of breath, nocturnal dyspnea, swelling, and patient denies any chest pain. She still is  taking her medications as prescribed. She states that she had a very salty meal today and feels that that is why her blood pressure is higher than normal and she is scheduled to take her medications around this time.  No sx suggestive of PE and no wheezing or productive cough fever to make me concerned for asthma exacerbation or PNA. Patient's lung sounds are clear with no evidence of distal edema. She does have swollen irritated nasal turbinates with some post pharyngeal drainage but she is otherwise well-appearing and satting 100% on room air in no acute distress. X-ray today without any signs of fluid overload. Her BNP is slightly up from baseline and creatinine is 1.95 however she ranges from 1.6-2.2.  EKG with some flattening of t-waves inferiorly but o/w unchanged.  Encouraged patient to avoid salty meals and followup with her PCP as well as taking her medications as she regularly does. Patient will follow up with her doctor next week for any problems or return sooner.    Blanchie Dessert, MD 09/08/13 2125  Blanchie Dessert, MD 09/08/13 2250

## 2013-09-08 NOTE — ED Notes (Signed)
Pt in with SOB, dry cough, and nasal congestion x2 days. Pt denies chest pain, dizziness, abd/back pain, or diaphoresis

## 2014-01-07 ENCOUNTER — Emergency Department (HOSPITAL_COMMUNITY)
Admission: EM | Admit: 2014-01-07 | Discharge: 2014-01-07 | Disposition: A | Discharge status: Home / Self Care | Payer: BC Managed Care – PPO | Attending: Emergency Medicine | Admitting: Emergency Medicine

## 2014-01-07 ENCOUNTER — Encounter (HOSPITAL_COMMUNITY): Payer: Self-pay | Admitting: Emergency Medicine

## 2014-01-07 ENCOUNTER — Emergency Department (HOSPITAL_COMMUNITY): Payer: BC Managed Care – PPO

## 2014-01-07 DIAGNOSIS — E119 Type 2 diabetes mellitus without complications: Secondary | ICD-10-CM | POA: Insufficient documentation

## 2014-01-07 DIAGNOSIS — Z87891 Personal history of nicotine dependence: Secondary | ICD-10-CM | POA: Insufficient documentation

## 2014-01-07 DIAGNOSIS — E785 Hyperlipidemia, unspecified: Secondary | ICD-10-CM | POA: Insufficient documentation

## 2014-01-07 DIAGNOSIS — F411 Generalized anxiety disorder: Secondary | ICD-10-CM | POA: Insufficient documentation

## 2014-01-07 DIAGNOSIS — R05 Cough: Secondary | ICD-10-CM | POA: Insufficient documentation

## 2014-01-07 DIAGNOSIS — I10 Essential (primary) hypertension: Secondary | ICD-10-CM | POA: Insufficient documentation

## 2014-01-07 DIAGNOSIS — R059 Cough, unspecified: Secondary | ICD-10-CM

## 2014-01-07 DIAGNOSIS — Z7982 Long term (current) use of aspirin: Secondary | ICD-10-CM | POA: Insufficient documentation

## 2014-01-07 DIAGNOSIS — Z8719 Personal history of other diseases of the digestive system: Secondary | ICD-10-CM | POA: Insufficient documentation

## 2014-01-07 DIAGNOSIS — M810 Age-related osteoporosis without current pathological fracture: Secondary | ICD-10-CM | POA: Insufficient documentation

## 2014-01-07 DIAGNOSIS — J45909 Unspecified asthma, uncomplicated: Secondary | ICD-10-CM | POA: Insufficient documentation

## 2014-01-07 DIAGNOSIS — M1710 Unilateral primary osteoarthritis, unspecified knee: Secondary | ICD-10-CM | POA: Insufficient documentation

## 2014-01-07 DIAGNOSIS — IMO0002 Reserved for concepts with insufficient information to code with codable children: Secondary | ICD-10-CM | POA: Insufficient documentation

## 2014-01-07 DIAGNOSIS — Z794 Long term (current) use of insulin: Secondary | ICD-10-CM | POA: Insufficient documentation

## 2014-01-07 DIAGNOSIS — Z79899 Other long term (current) drug therapy: Secondary | ICD-10-CM | POA: Insufficient documentation

## 2014-01-07 DIAGNOSIS — M549 Dorsalgia, unspecified: Secondary | ICD-10-CM | POA: Insufficient documentation

## 2014-01-07 DIAGNOSIS — I509 Heart failure, unspecified: Secondary | ICD-10-CM | POA: Insufficient documentation

## 2014-01-07 DIAGNOSIS — R079 Chest pain, unspecified: Secondary | ICD-10-CM | POA: Insufficient documentation

## 2014-01-07 LAB — BASIC METABOLIC PANEL
BUN: 28 mg/dL — ABNORMAL HIGH (ref 6–23)
CO2: 26 mEq/L (ref 19–32)
Calcium: 8.4 mg/dL (ref 8.4–10.5)
Chloride: 96 mEq/L (ref 96–112)
Creatinine, Ser: 2.35 mg/dL — ABNORMAL HIGH (ref 0.50–1.10)
GFR calc Af Amer: 24 mL/min — ABNORMAL LOW (ref >90)
GFR calc non Af Amer: 21 mL/min — ABNORMAL LOW (ref >90)
Glucose, Bld: 276 mg/dL — ABNORMAL HIGH (ref 70–99)
Potassium: 2.8 mEq/L — CL (ref 3.7–5.3)
Sodium: 137 mEq/L (ref 137–147)

## 2014-01-07 LAB — CBC
HCT: 30.7 % — ABNORMAL LOW (ref 36.0–46.0)
Hemoglobin: 9.9 g/dL — ABNORMAL LOW (ref 12.0–15.0)
MCH: 27.5 pg (ref 26.0–34.0)
MCHC: 32.2 g/dL (ref 30.0–36.0)
MCV: 85.3 fL (ref 78.0–100.0)
Platelets: 286 10*3/uL (ref 150–400)
RBC: 3.6 MIL/uL — ABNORMAL LOW (ref 3.87–5.11)
RDW: 13.3 % (ref 11.5–15.5)
WBC: 7.7 10*3/uL (ref 4.0–10.5)

## 2014-01-07 LAB — I-STAT TROPONIN, ED: Troponin i, poc: 0.01 ng/mL (ref 0.00–0.08)

## 2014-01-07 LAB — PRO B NATRIURETIC PEPTIDE: Pro B Natriuretic peptide (BNP): 15049 pg/mL — ABNORMAL HIGH (ref 0–125)

## 2014-01-07 MED ORDER — POTASSIUM CHLORIDE CRYS ER 20 MEQ PO TBCR
40.0000 meq | EXTENDED_RELEASE_TABLET | Freq: Once | ORAL | Status: AC
  Administered 2014-01-07: 40 meq via ORAL
  Filled 2014-01-07: qty 2

## 2014-01-07 MED ORDER — HYDROCOD POLST-CHLORPHEN POLST 10-8 MG/5ML PO LQCR: 5.0000 mL | Freq: Two times a day (BID) | ORAL | Status: DC

## 2014-01-07 MED ORDER — HYDROCOD POLST-CHLORPHEN POLST 10-8 MG/5ML PO LQCR
5.0000 mL | Freq: Once | ORAL | Status: AC
  Administered 2014-01-07: 5 mL via ORAL
  Filled 2014-01-07: qty 5

## 2014-01-07 NOTE — ED Notes (Signed)
Sub sternal cp when coughing. Productive cough - Destiny Day.  Now, bilateral shoulders and lower back pain.

## 2014-01-07 NOTE — Discharge Instructions (Signed)
Back Pain, Adult Low back pain is very common. About 1 in 5 people have back pain.The cause of low back pain is rarely dangerous. The pain often gets better over time.About half of people with a sudden onset of back pain feel better in just 2 weeks. About 8 in 10 people feel better by 6 weeks.  CAUSES Some common causes of back pain include:  Strain of the muscles or ligaments supporting the spine.  Wear and tear (degeneration) of the spinal discs.  Arthritis.  Direct injury to the back. DIAGNOSIS Most of the time, the direct cause of low back pain is not known.However, back pain can be treated effectively even when the exact cause of the pain is unknown.Answering your caregiver's questions about your overall health and symptoms is one of the most accurate ways to make sure the cause of your pain is not dangerous. If your caregiver needs more information, he or she may order lab work or imaging tests (X-rays or MRIs).However, even if imaging tests show changes in your back, this usually does not require surgery. HOME CARE INSTRUCTIONS For many people, back pain returns.Since low back pain is rarely dangerous, it is often a condition that people can learn to [facility] their own.   Remain active. It is stressful on the back to sit or stand in one place. Do not sit, drive, or stand in one place for more than 30 minutes at a time. Take short walks on level surfaces as soon as pain allows.Try to increase the length of time you walk each day.  Do not stay in bed.Resting more than 1 or 2 days can delay your recovery.  Do not avoid exercise or work.Your body is made to move.It is not dangerous to be active, even though your back may hurt.Your back will likely heal faster if you return to being active before your pain is gone.  Pay attention to your body when you bend and lift. Many people have less discomfortwhen lifting if they bend their knees, keep the load close to their bodies,and  avoid twisting. Often, the most comfortable positions are those that put less stress on your recovering back.  Find a comfortable position to sleep. Use a firm mattress and lie on your side with your knees slightly bent. If you lie on your back, put a pillow under your knees.  Only take over-the-counter or prescription medicines as directed by your caregiver. Over-the-counter medicines to reduce pain and inflammation are often the most helpful.Your caregiver may prescribe muscle relaxant drugs.These medicines help dull your pain so you can more quickly return to your normal activities and healthy exercise.  Put ice on the injured area.  Put ice in a plastic bag.  Place a towel between your skin and the bag.  Leave the ice on for 15-20 minutes, 03-04 times a day for the first 2 to 3 days. After that, ice and heat may be alternated to reduce pain and spasms.  Ask your caregiver about trying back exercises and gentle massage. This may be of some benefit.  Avoid feeling anxious or stressed.Stress increases muscle tension and can worsen back pain.It is important to recognize when you are anxious or stressed and learn ways to manage it.Exercise is a great option. SEEK MEDICAL CARE IF:  You have pain that is not relieved with rest or medicine.  You have pain that does not improve in 1 week.  You have new symptoms.  You are generally not feeling well. SEEK  IMMEDIATE MEDICAL CARE IF:   You have pain that radiates from your back into your legs.  You develop new bowel or bladder control problems.  You have unusual weakness or numbness in your arms or legs.  You develop nausea or vomiting.  You develop abdominal pain.  You feel faint. Document Released: 07/13/2005 Document Revised: 01/12/2012 Document Reviewed: 12/01/2010 [facility] Patient Information 2014 [place], [place].  Cough, Adult  A cough is a reflex that helps clear your throat and airways. It can help heal the body or may  be a reaction to an irritated airway. A cough may only last 2 or 3 weeks (acute) or may last more than 8 weeks (chronic).  CAUSES Acute cough:  Viral or bacterial infections. Chronic cough:  Infections.  Allergies.  Asthma.  Post-nasal drip.  Smoking.  Heartburn or acid reflux.  Some medicines.  Chronic lung problems (COPD).  Cancer. SYMPTOMS   Cough.  Fever.  Chest pain.  Increased breathing rate.  High-pitched whistling sound when breathing (wheezing).  Colored mucus that you cough up (sputum). TREATMENT   A bacterial cough may be treated with antibiotic medicine.  A viral cough must run its course and will not respond to antibiotics.  Your caregiver may recommend other treatments if you have a chronic cough. HOME CARE INSTRUCTIONS   Only take over-the-counter or prescription medicines for pain, discomfort, or fever as directed by your caregiver. Use cough suppressants only as directed by your caregiver.  Use a cold steam vaporizer or humidifier in your bedroom or home to help loosen secretions.  Sleep in a semi-upright position if your cough is worse at night.  Rest as needed.  Stop smoking if you smoke. SEEK IMMEDIATE MEDICAL CARE IF:   You have pus in your sputum.  Your cough starts to worsen.  You cannot control your cough with suppressants and are losing sleep.  You begin coughing up blood.  You have difficulty breathing.  You develop pain which is getting worse or is uncontrolled with medicine.  You have a fever. MAKE SURE YOU:   Understand these instructions.  Will watch your condition.  Will get help right away if you are not doing well or get worse. Document Released: 01/09/2011 Document Revised: 10/05/2011 Document Reviewed: 01/09/2011 [facility] Patient Information 2014 [place].

## 2014-01-07 NOTE — ED Provider Notes (Signed)
CSN: [id number]     Arrival date & time 01/07/14  0049 History   First MD Initiated Contact with Patient 01/07/14 0253     Chief Complaint  Patient presents with  . Chest Pain  . Cough     (Consider location/radiation/quality/duration/timing/severity/associated sxs/prior Treatment) Patient is a 63 y.o. female presenting with chest pain and cough. The history is provided by the patient.  Chest Pain Associated symptoms: cough   Cough Associated symptoms: chest pain    She complains of chronic cough, productive of white sputum. She came in tonight because of right scapular pain , which started spontaneously, today. She denies fever, chills, nausea, or vomiting. She's taking her usual medications, without relief. She denies chest pain. No weakness, or dizziness. There are no other known modifying factors.   Past Medical History  Diagnosis Date  . Hypertension   . Diverticulitis   . Hyperlipidemia   . Asthma   . Diabetes mellitus     insulin dependent  . Arthritis     knee  . GI bleed 03/31/2013  . CHF (congestive heart failure)   . Osteoporosis   . Seasonal allergies   . Anxiety    Past Surgical History  Procedure Laterality Date  . Abdominal hysterectomy    . Esophagogastroduodenoscopy N/A 03/31/2013    Procedure: ESOPHAGOGASTRODUODENOSCOPY (EGD);  Surgeon: Gatha Mayer, MD;  Location: [facility] ENDOSCOPY;  Service: Endoscopy;  Laterality: N/A;   Family History  Problem Relation Age of Onset  . Colon cancer Neg Hx    History  Substance Use Topics  . Smoking status: Former Smoker    Quit date: 05/10/2012  . Smokeless tobacco: Never Used  . Alcohol Use: No   OB History   Grav Para Term Preterm Abortions TAB SAB Ect Mult Living                 Review of Systems  Respiratory: Positive for cough.   Cardiovascular: Positive for chest pain.  All other systems reviewed and are negative.     Allergies  Lisinopril and Prednisone  Home Medications   Prior to  Admission medications   Medication Sig Start Date End Date Taking? Authorizing Provider  albuterol (PROVENTIL HFA;VENTOLIN HFA) 108 (90 BASE) MCG/ACT inhaler Inhale 2 puffs into the lungs every 6 (six) hours as needed for wheezing.   Yes Historical Provider, MD  amLODipine (NORVASC) 10 MG tablet Take 1 tablet (10 mg total) by mouth daily. 07/23/13  Yes Velvet Bathe, MD  aspirin 81 MG chewable tablet Chew 81 mg by mouth daily.   Yes Historical Provider, MD  atorvastatin (LIPITOR) 20 MG tablet Take 20 mg by mouth daily at 6 PM.   Yes Historical Provider, MD  budesonide-formoterol (SYMBICORT) 160-4.5 MCG/ACT inhaler Inhale 1 puff into the lungs every evening.   Yes Historical Provider, MD  Calcium Carbonate-Vit D-Min (CALCIUM 600+D PLUS MINERALS) 600-400 MG-UNIT TABS Take 1 tablet by mouth daily.   Yes Historical Provider, MD  carvedilol (COREG) 25 MG tablet Take 1 tablet (25 mg total) by mouth 2 (two) times daily with a meal. 06/01/13  Yes Costin Karlyne Greenspan, MD  escitalopram (LEXAPRO) 10 MG tablet Take 10 mg by mouth daily as needed (ANXIETY).   Yes Historical Provider, MD  Ferrous Sulfate (IRON) 325 (65 FE) MG TABS Take 1 capsule by mouth daily.   Yes Historical Provider, MD  furosemide (LASIX) 40 MG tablet Take 1 tablet (40 mg total) by mouth 2 (two) times daily. 06/01/13  Yes Costin Karlyne Greenspan, MD  insulin aspart (NOVOLOG) 100 UNIT/ML injection Inject 0-8 Units into the skin 3 (three) times daily before meals. Sliding scale   Yes Historical Provider, MD  insulin detemir (LEVEMIR) 100 UNIT/ML injection Inject 10 Units into the skin at bedtime.   Yes Historical Provider, MD  isosorbide-hydrALAZINE (BIDIL) 20-37.5 MG per tablet Take 1 tablet by mouth 3 (three) times daily.   Yes Historical Provider, MD  loratadine (CLARITIN) 10 MG tablet Take 10 mg by mouth daily as needed for allergies.   Yes Historical Provider, MD  mometasone (NASONEX) 50 MCG/ACT nasal spray Place 2 sprays into both nostrils daily as  needed (ALLERGIES).   Yes Historical Provider, MD  potassium chloride SA (K-DUR,KLOR-CON) 20 MEQ tablet Take 20 mEq by mouth 2 (two) times daily.   Yes Historical Provider, MD  Vitamin D, Ergocalciferol, (DRISDOL) 50000 UNITS CAPS capsule Take 50,000 Units by mouth every 7 (seven) days. On wednesday   Yes Historical Provider, MD  chlorpheniramine-HYDROcodone (TUSSIONEX PENNKINETIC ER) 10-8 MG/5ML LQCR Take 5 mLs by mouth 2 (two) times daily. For cough, or pain 01/07/14   Richarda Blade, MD   BP 210/97  Pulse 98  Temp(Src) 98.4 F (36.9 C) (Oral)  Resp 21  Wt 151 lb 9.6 oz (68.765 kg)  SpO2 97% Physical Exam  Nursing note and vitals reviewed. Constitutional: She is oriented to person, place, and time. She appears well-developed.  Elderly, frail  HENT:  Head: Normocephalic and atraumatic.  Eyes: Conjunctivae and EOM are normal. Pupils are equal, round, and reactive to light.  Neck: Normal range of motion and phonation normal. Neck supple.  Cardiovascular: Normal rate, regular rhythm and intact distal pulses.   Pulmonary/Chest: Effort normal and breath sounds normal. She exhibits no tenderness.  Abdominal: Soft. She exhibits no distension. There is no tenderness. There is no guarding.  Musculoskeletal: Normal range of motion.  Neurological: She is alert and oriented to person, place, and time. She exhibits normal muscle tone.  Skin: Skin is warm and dry.  Psychiatric: She has a normal mood and affect. Her behavior is normal. Judgment and thought content normal.    ED Course  Procedures (including critical care time) Labs Review Labs Reviewed  CBC - Abnormal; Notable for the following:    RBC 3.60 (*)    Hemoglobin 9.9 (*)    HCT 30.7 (*)    All other components within normal limits  BASIC METABOLIC PANEL - Abnormal; Notable for the following:    Potassium 2.8 (*)    Glucose, Bld 276 (*)    BUN 28 (*)    Creatinine, Ser 2.35 (*)    GFR calc non Af Amer 21 (*)    GFR calc Af  Amer 24 (*)    All other components within normal limits  PRO B NATRIURETIC PEPTIDE - Abnormal; Notable for the following:    Pro B Natriuretic peptide (BNP) 15049.0 (*)    All other components within normal limits  I-STAT TROPOININ, ED   Component     Latest Ref Rng 09/08/2013 01/07/2014           Sodium     137 - 147 mEq/L 139 137  Potassium     3.7 - 5.3 mEq/L 3.7 2.8 (LL)  Chloride     96 - 112 mEq/L 100 96  CO2     19 - 32 mEq/L 27 26  Glucose     70 - 99 mg/dL 146 (H) 276 (  H)  BUN     6 - 23 mg/dL 30 (H) 28 (H)  Creatinine     0.50 - 1.10 mg/dL 1.95 (H) 2.35 (H)  Calcium     8.4 - 10.5 mg/dL 8.5 8.4  GFR calc non Af Amer     >90 mL/min 26 (L) 21 (L)  GFR calc Af Amer     >90 mL/min 31 (L) 24 (L)     Imaging Review Dg Chest 2 View  01/07/2014   CLINICAL DATA:  Cough, substernal chest pain  EXAM: CHEST  2 VIEW  COMPARISON:  09/08/2013  FINDINGS: Chronic interstitial markings. No focal consolidation. No pleural effusion or pneumothorax.  The heart is top-normal in size.  Degenerative changes of the visualized thoracolumbar spine.  IMPRESSION: No evidence of acute cardiopulmonary disease.   Electronically Signed   By: Julian Hy M.D.   On: 01/07/2014 02:10     Date: 01/07/14  Rate: 79  Rhythm: normal sinus rhythm  QRS Axis: normal  PR and QT Intervals: normal  ST/T Wave abnormalities: nonspecific T wave changes  PR and QRS Conduction Disutrbances:none  Narrative Interpretation:   Old EKG Reviewed: unchanged- 09/08/13    EKG Interpretation None      MDM   Final diagnoses:  Cough  Back pain    Nonspecific, cough, and chest pain. Doubt ACS, PE, or pneumonia. Elevated BNP, but chest x-ray, not indicative of congestive heart failure. She has increased renal insufficiency, from her baseline 4 months ago. There is incidental hypokalemia.    Richarda Blade, MD 01/07/14 [phone]

## 2014-01-09 ENCOUNTER — Emergency Department (HOSPITAL_COMMUNITY): Payer: BC Managed Care – PPO

## 2014-01-09 ENCOUNTER — Inpatient Hospital Stay (HOSPITAL_COMMUNITY)
Admission: EM | Admit: 2014-01-09 | Discharge: 2014-01-12 | DRG: 291 | Disposition: A | Discharge status: Home / Self Care | Payer: BC Managed Care – PPO | Attending: Internal Medicine | Admitting: Internal Medicine

## 2014-01-09 DIAGNOSIS — Z794 Long term (current) use of insulin: Secondary | ICD-10-CM

## 2014-01-09 DIAGNOSIS — N183 Chronic kidney disease, stage 3 unspecified: Secondary | ICD-10-CM

## 2014-01-09 DIAGNOSIS — I471 Supraventricular tachycardia, unspecified: Secondary | ICD-10-CM | POA: Diagnosis not present

## 2014-01-09 DIAGNOSIS — E119 Type 2 diabetes mellitus without complications: Secondary | ICD-10-CM

## 2014-01-09 DIAGNOSIS — E1165 Type 2 diabetes mellitus with hyperglycemia: Secondary | ICD-10-CM | POA: Diagnosis present

## 2014-01-09 DIAGNOSIS — I129 Hypertensive chronic kidney disease with stage 1 through stage 4 chronic kidney disease, or unspecified chronic kidney disease: Secondary | ICD-10-CM | POA: Diagnosis present

## 2014-01-09 DIAGNOSIS — J9600 Acute respiratory failure, unspecified whether with hypoxia or hypercapnia: Secondary | ICD-10-CM

## 2014-01-09 DIAGNOSIS — Z79899 Other long term (current) drug therapy: Secondary | ICD-10-CM

## 2014-01-09 DIAGNOSIS — I5033 Acute on chronic diastolic (congestive) heart failure: Principal | ICD-10-CM | POA: Diagnosis present

## 2014-01-09 DIAGNOSIS — E876 Hypokalemia: Secondary | ICD-10-CM | POA: Diagnosis present

## 2014-01-09 DIAGNOSIS — Z9119 Patient's noncompliance with other medical treatment and regimen: Secondary | ICD-10-CM

## 2014-01-09 DIAGNOSIS — J9601 Acute respiratory failure with hypoxia: Secondary | ICD-10-CM

## 2014-01-09 DIAGNOSIS — Z7982 Long term (current) use of aspirin: Secondary | ICD-10-CM

## 2014-01-09 DIAGNOSIS — Z91199 Patient's noncompliance with other medical treatment and regimen due to unspecified reason: Secondary | ICD-10-CM

## 2014-01-09 DIAGNOSIS — J45909 Unspecified asthma, uncomplicated: Secondary | ICD-10-CM | POA: Diagnosis present

## 2014-01-09 DIAGNOSIS — E1121 Type 2 diabetes mellitus with diabetic nephropathy: Secondary | ICD-10-CM

## 2014-01-09 DIAGNOSIS — I4891 Unspecified atrial fibrillation: Secondary | ICD-10-CM | POA: Diagnosis not present

## 2014-01-09 DIAGNOSIS — I10 Essential (primary) hypertension: Secondary | ICD-10-CM

## 2014-01-09 DIAGNOSIS — E1129 Type 2 diabetes mellitus with other diabetic kidney complication: Secondary | ICD-10-CM | POA: Diagnosis present

## 2014-01-09 DIAGNOSIS — I509 Heart failure, unspecified: Secondary | ICD-10-CM | POA: Diagnosis present

## 2014-01-09 DIAGNOSIS — M810 Age-related osteoporosis without current pathological fracture: Secondary | ICD-10-CM | POA: Diagnosis present

## 2014-01-09 DIAGNOSIS — I160 Hypertensive urgency: Secondary | ICD-10-CM

## 2014-01-09 DIAGNOSIS — I5032 Chronic diastolic (congestive) heart failure: Secondary | ICD-10-CM

## 2014-01-09 DIAGNOSIS — I498 Other specified cardiac arrhythmias: Secondary | ICD-10-CM | POA: Diagnosis not present

## 2014-01-09 DIAGNOSIS — N058 Unspecified nephritic syndrome with other morphologic changes: Secondary | ICD-10-CM | POA: Diagnosis present

## 2014-01-09 DIAGNOSIS — Z87891 Personal history of nicotine dependence: Secondary | ICD-10-CM

## 2014-01-09 DIAGNOSIS — N289 Disorder of kidney and ureter, unspecified: Secondary | ICD-10-CM

## 2014-01-09 DIAGNOSIS — J810 Acute pulmonary edema: Secondary | ICD-10-CM

## 2014-01-09 DIAGNOSIS — N184 Chronic kidney disease, stage 4 (severe): Secondary | ICD-10-CM | POA: Diagnosis present

## 2014-01-09 DIAGNOSIS — E785 Hyperlipidemia, unspecified: Secondary | ICD-10-CM

## 2014-01-09 LAB — I-STAT ARTERIAL BLOOD GAS, ED
Acid-Base Excess: 1 mmol/L (ref 0.0–2.0)
Bicarbonate: 27.4 mEq/L — ABNORMAL HIGH (ref 20.0–24.0)
O2 Saturation: 94 %
Patient temperature: 98.6
TCO2: 29 mmol/L (ref 0–100)
pCO2 arterial: 49.3 mmHg — ABNORMAL HIGH (ref 35.0–45.0)
pH, Arterial: 7.354 (ref 7.350–7.450)
pO2, Arterial: 76 mmHg — ABNORMAL LOW (ref 80.0–100.0)

## 2014-01-09 LAB — CBC WITH DIFFERENTIAL/PLATELET
Basophils Absolute: 0 10*3/uL (ref 0.0–0.1)
Basophils Relative: 0 % (ref 0–1)
Eosinophils Absolute: 0.3 10*3/uL (ref 0.0–0.7)
Eosinophils Relative: 3 % (ref 0–5)
HCT: 37.8 % (ref 36.0–46.0)
Hemoglobin: 12.4 g/dL (ref 12.0–15.0)
Lymphocytes Relative: 51 % — ABNORMAL HIGH (ref 12–46)
Lymphs Abs: 4.7 10*3/uL — ABNORMAL HIGH (ref 0.7–4.0)
MCH: 28.2 pg (ref 26.0–34.0)
MCHC: 32.8 g/dL (ref 30.0–36.0)
MCV: 86.1 fL (ref 78.0–100.0)
Monocytes Absolute: 0.5 10*3/uL (ref 0.1–1.0)
Monocytes Relative: 5 % (ref 3–12)
Neutro Abs: 3.9 10*3/uL (ref 1.7–7.7)
Neutrophils Relative %: 41 % — ABNORMAL LOW (ref 43–77)
Platelets: 382 10*3/uL (ref 150–400)
RBC: 4.39 MIL/uL (ref 3.87–5.11)
RDW: 13.4 % (ref 11.5–15.5)
WBC: 9.4 10*3/uL (ref 4.0–10.5)

## 2014-01-09 LAB — I-STAT CHEM 8, ED
BUN: 24 mg/dL — ABNORMAL HIGH (ref 6–23)
Calcium, Ion: 1.06 mmol/L — ABNORMAL LOW (ref 1.13–1.30)
Chloride: 104 mEq/L (ref 96–112)
Creatinine, Ser: 2.4 mg/dL — ABNORMAL HIGH (ref 0.50–1.10)
Glucose, Bld: 313 mg/dL — ABNORMAL HIGH (ref 70–99)
HCT: 42 % (ref 36.0–46.0)
Hemoglobin: 14.3 g/dL (ref 12.0–15.0)
Potassium: 3.3 mEq/L — ABNORMAL LOW (ref 3.7–5.3)
Sodium: 136 mEq/L — ABNORMAL LOW (ref 137–147)
TCO2: 27 mmol/L (ref 0–100)

## 2014-01-09 LAB — PROTIME-INR
INR: 0.96 (ref 0.00–1.49)
Prothrombin Time: 12.6 seconds (ref 11.6–15.2)

## 2014-01-09 LAB — I-STAT CG4 LACTIC ACID, ED: Lactic Acid, Venous: 2.37 mmol/L — ABNORMAL HIGH (ref 0.5–2.2)

## 2014-01-09 LAB — MAGNESIUM: Magnesium: 1.7 mg/dL (ref 1.5–2.5)

## 2014-01-09 LAB — I-STAT TROPONIN, ED: Troponin i, poc: 0.03 ng/mL (ref 0.00–0.08)

## 2014-01-09 LAB — PRO B NATRIURETIC PEPTIDE: Pro B Natriuretic peptide (BNP): 20261 pg/mL — ABNORMAL HIGH (ref 0–125)

## 2014-01-09 MED ORDER — VANCOMYCIN HCL IN DEXTROSE 1-5 GM/200ML-% IV SOLN
1000.0000 mg | Freq: Once | INTRAVENOUS | Status: AC
  Administered 2014-01-10: 1000 mg via INTRAVENOUS
  Filled 2014-01-09: qty 200

## 2014-01-09 MED ORDER — FUROSEMIDE 10 MG/ML IJ SOLN
40.0000 mg | Freq: Once | INTRAMUSCULAR | Status: AC
  Administered 2014-01-09: 40 mg via INTRAVENOUS
  Filled 2014-01-09: qty 4

## 2014-01-09 MED ORDER — ALBUTEROL SULFATE (2.5 MG/3ML) 0.083% IN NEBU: 2.5000 mg | INHALATION_SOLUTION | Freq: Four times a day (QID) | RESPIRATORY_TRACT | Status: DC

## 2014-01-09 MED ORDER — LEVALBUTEROL HCL 0.63 MG/3ML IN NEBU
0.6300 mg | INHALATION_SOLUTION | RESPIRATORY_TRACT | Status: AC
  Administered 2014-01-10: 0.63 mg via RESPIRATORY_TRACT
  Filled 2014-01-09: qty 3

## 2014-01-09 MED ORDER — ASPIRIN 300 MG RE SUPP
300.0000 mg | Freq: Once | RECTAL | Status: AC
  Administered 2014-01-10: 300 mg via RECTAL
  Filled 2014-01-09: qty 1

## 2014-01-09 MED ORDER — NITROGLYCERIN IN D5W 200-5 MCG/ML-% IV SOLN
2.0000 ug/min | Freq: Once | INTRAVENOUS | Status: AC
Start: 2014-01-09 — End: 2014-01-09
  Administered 2014-01-09: 5 ug/min via INTRAVENOUS

## 2014-01-09 MED ORDER — PIPERACILLIN-TAZOBACTAM 3.375 G IVPB 30 MIN
3.3750 g | Freq: Once | INTRAVENOUS | Status: AC
  Administered 2014-01-10: 3.375 g via INTRAVENOUS
  Filled 2014-01-09: qty 50

## 2014-01-09 MED ORDER — ASPIRIN 81 MG PO CHEW
324.0000 mg | CHEWABLE_TABLET | Freq: Once | ORAL | Status: DC
  Filled 2014-01-09: qty 4

## 2014-01-09 MED ORDER — POTASSIUM CHLORIDE 10 MEQ/100ML IV SOLN
10.0000 meq | Freq: Once | INTRAVENOUS | Status: AC
  Administered 2014-01-10: 10 meq via INTRAVENOUS
  Filled 2014-01-09: qty 100

## 2014-01-09 MED ORDER — NITROGLYCERIN IN D5W 200-5 MCG/ML-% IV SOLN
INTRAVENOUS | Status: AC
  Filled 2014-01-09: qty 250

## 2014-01-09 NOTE — ED Notes (Signed)
[facility] with sudden onset shortness of breath. Hx of CHF. Patient placed on c-pap en route.

## 2014-01-09 NOTE — ED Provider Notes (Signed)
CSN: [id number]     Arrival date & time 01/09/14  2311 History   First MD Initiated Contact with Patient 01/09/14 2316     Chief Complaint  Patient presents with  . Shortness of Breath     (Consider location/radiation/quality/duration/timing/severity/associated sxs/prior Treatment) Patient is a 63 y.o. female presenting with shortness of breath. The history is provided by the EMS personnel. The history is limited by the condition of the patient.  Shortness of Breath Severity:  Severe Onset quality:  Gradual Timing:  Constant Progression:  Worsening Chronicity:  Recurrent Relieved by:  Nothing Worsened by:  Nothing tried Ineffective treatments:  None tried Associated symptoms: no fever   Risk factors: no oral contraceptive use     Past Medical History  Diagnosis Date  . Hypertension   . Diverticulitis   . Hyperlipidemia   . Asthma   . Diabetes mellitus     insulin dependent  . Arthritis     knee  . GI bleed 03/31/2013  . CHF (congestive heart failure)   . Osteoporosis   . Seasonal allergies   . Anxiety    Past Surgical History  Procedure Laterality Date  . Abdominal hysterectomy    . Esophagogastroduodenoscopy N/A 03/31/2013    Procedure: ESOPHAGOGASTRODUODENOSCOPY (EGD);  Surgeon: Gatha Mayer, MD;  Location: [facility] ENDOSCOPY;  Service: Endoscopy;  Laterality: N/A;   Family History  Problem Relation Age of Onset  . Colon cancer Neg Hx    History  Substance Use Topics  . Smoking status: Former Smoker    Quit date: 05/10/2012  . Smokeless tobacco: Never Used  . Alcohol Use: No   OB History   Grav Para Term Preterm Abortions TAB SAB Ect Mult Living                 Review of Systems  Unable to perform ROS Constitutional: Negative for fever.  Respiratory: Positive for shortness of breath.       Allergies  Lisinopril and Prednisone  Home Medications   Prior to Admission medications   Medication Sig Start Date End Date Taking? Authorizing Provider   albuterol (PROVENTIL HFA;VENTOLIN HFA) 108 (90 BASE) MCG/ACT inhaler Inhale 2 puffs into the lungs every 6 (six) hours as needed for wheezing.    Historical Provider, MD  amLODipine (NORVASC) 10 MG tablet Take 1 tablet (10 mg total) by mouth daily. 07/23/13   Velvet Bathe, MD  aspirin 81 MG chewable tablet Chew 81 mg by mouth daily.    Historical Provider, MD  atorvastatin (LIPITOR) 20 MG tablet Take 20 mg by mouth daily at 6 PM.    Historical Provider, MD  budesonide-formoterol (SYMBICORT) 160-4.5 MCG/ACT inhaler Inhale 1 puff into the lungs every evening.    Historical Provider, MD  Calcium Carbonate-Vit D-Min (CALCIUM 600+D PLUS MINERALS) 600-400 MG-UNIT TABS Take 1 tablet by mouth daily.    Historical Provider, MD  carvedilol (COREG) 25 MG tablet Take 1 tablet (25 mg total) by mouth 2 (two) times daily with a meal. 06/01/13   Costin Karlyne Greenspan, MD  chlorpheniramine-HYDROcodone (TUSSIONEX PENNKINETIC ER) 10-8 MG/5ML LQCR Take 5 mLs by mouth 2 (two) times daily. For cough, or pain 01/07/14   Richarda Blade, MD  escitalopram (LEXAPRO) 10 MG tablet Take 10 mg by mouth daily as needed (ANXIETY).    Historical Provider, MD  Ferrous Sulfate (IRON) 325 (65 FE) MG TABS Take 1 capsule by mouth daily.    Historical Provider, MD  furosemide (LASIX) 40 MG tablet  Take 1 tablet (40 mg total) by mouth 2 (two) times daily. 06/01/13   Costin Karlyne Greenspan, MD  insulin aspart (NOVOLOG) 100 UNIT/ML injection Inject 0-8 Units into the skin 3 (three) times daily before meals. Sliding scale    Historical Provider, MD  insulin detemir (LEVEMIR) 100 UNIT/ML injection Inject 10 Units into the skin at bedtime.    Historical Provider, MD  isosorbide-hydrALAZINE (BIDIL) 20-37.5 MG per tablet Take 1 tablet by mouth 3 (three) times daily.    Historical Provider, MD  loratadine (CLARITIN) 10 MG tablet Take 10 mg by mouth daily as needed for allergies.    Historical Provider, MD  mometasone (NASONEX) 50 MCG/ACT nasal spray Place 2  sprays into both nostrils daily as needed (ALLERGIES).    Historical Provider, MD  potassium chloride SA (K-DUR,KLOR-CON) 20 MEQ tablet Take 20 mEq by mouth 2 (two) times daily.    Historical Provider, MD  Vitamin D, Ergocalciferol, (DRISDOL) 50000 UNITS CAPS capsule Take 50,000 Units by mouth every 7 (seven) days. On wednesday    Historical Provider, MD   BP 209/134  Pulse 110  Temp(Src) 96.4 F (35.8 C) (Axillary)  Resp 21  SpO2 100% Physical Exam  Constitutional: She appears well-developed and well-nourished.  HENT:  Head: Normocephalic and atraumatic.  Eyes: Conjunctivae are normal. Pupils are equal, round, and reactive to light.  Neck: Normal range of motion. Neck supple.  Cardiovascular: Regular rhythm.  Tachycardia present.   Pulmonary/Chest: No stridor. She is in respiratory distress. She has wheezes. She has rales. She exhibits no tenderness.  Abdominal: Soft. Bowel sounds are normal. There is no tenderness. There is no rebound and no guarding.  Musculoskeletal: She exhibits no edema and no tenderness.  Neurological: She is alert. She has normal reflexes.  Skin: Skin is warm and dry. No rash noted. She is not diaphoretic.    ED Course  Procedures (including critical care time) Labs Review Labs Reviewed  CBC WITH DIFFERENTIAL - Abnormal; Notable for the following:    Neutrophils Relative % 41 (*)    Lymphocytes Relative 51 (*)    Lymphs Abs 4.7 (*)    All other components within normal limits  I-STAT CHEM 8, ED - Abnormal; Notable for the following:    Sodium 136 (*)    Potassium 3.3 (*)    BUN 24 (*)    Creatinine, Ser 2.40 (*)    Glucose, Bld 313 (*)    Calcium, Ion 1.06 (*)    All other components within normal limits  I-STAT CG4 LACTIC ACID, ED - Abnormal; Notable for the following:    Lactic Acid, Venous 2.37 (*)    All other components within normal limits  I-STAT ARTERIAL BLOOD GAS, ED - Abnormal; Notable for the following:    pCO2 arterial 49.3 (*)     pO2, Arterial 76.0 (*)    Bicarbonate 27.4 (*)    All other components within normal limits  PRO B NATRIURETIC PEPTIDE  PROTIME-INR  MAGNESIUM  I-STAT TROPOININ, ED    Imaging Review Dg Chest Portable 1 View  01/09/2014   CLINICAL DATA:  Shortness of breath.  EXAM: PORTABLE CHEST - 1 VIEW  COMPARISON:  Chest radiograph January 07, 2014  FINDINGS: Cardiac silhouette appears mildly enlarged, mediastinal silhouette is nonsuspicious, mildly calcified aortic knob. Mild central pulmonary vasculature congestion with decreasing interstitial prominence, right lower lobe airspace opacity. No pleural effusions. No pneumothorax. Moderate degenerate change of the shoulders. Multiple EKG lines overlie the patient and may  obscure subtle underlying pathology.  IMPRESSION: Stable cardiomegaly, increasing interstitial prominence with right lower lobe airspace opacity concerning for pulmonary edema and confluent edema versus pneumonia. Recommend followup chest radiograph after treatment to verify improvement.   Electronically Signed   By: Elon Alas   On: 01/09/2014 23:34     EKG Interpretation   Date/Time:  Tuesday January 09 2014 23:14:40 EDT Ventricular Rate:  150 PR Interval:  106 QRS Duration: 89 QT Interval:  326 QTC Calculation: 515 R Axis:   59 Text Interpretation:  Sinus tachycardia Anteroseptal infarct, old  Repolarization abnormality, prob rate related Prolonged QT interval  Confirmed by [facility]  MD, APRIL ([id number]) on 01/09/2014 11:29:57 PM      MDM   Final diagnoses:  None    MDM Reviewed: previous chart, nursing note and vitals Reviewed previous: labs, ECG and x-ray Interpretation: labs, ECG and x-ray (hypokalemia renal insufficiency) Total time providing critical care: 75-105 minutes. This excludes time spent performing separately reportable procedures and services. Consults: pulmonary   Medications  vancomycin (VANCOCIN) IVPB 1000 mg/200 mL premix (1,000 mg Intravenous  New Bag/Given 01/10/14 0042)  piperacillin-tazobactam (ZOSYN) IVPB 3.375 g (3.375 g Intravenous New Bag/Given 01/10/14 0042)  potassium chloride 10 mEq in 100 mL IVPB (not administered)  potassium chloride 10 mEq in 100 mL IVPB (not administered)  furosemide (LASIX) injection 20 mg (not administered)  furosemide (LASIX) injection 40 mg (40 mg Intravenous Given 01/09/14 2343)  nitroGLYCERIN 0.2 mg/mL in dextrose 5 % infusion (10 mcg/min Intravenous Rate/Dose Change 01/09/14 2344)  levalbuterol (XOPENEX) nebulizer solution 0.63 mg (0.63 mg Nebulization Given 01/10/14 0053)  aspirin suppository 300 mg (300 mg Rectal Given 01/10/14 0056)   CRITICAL CARE Performed by: Carlisle Beers Total critical care time: 90 minutes Critical care time was exclusive of separately billable procedures and treating other patients. Critical care was necessary to treat or prevent imminent or life-threatening deterioration. Critical care was time spent personally by me on the following activities: development of treatment plan with patient and/or surrogate as well as nursing, discussions with consultants, evaluation of patient's response to treatment, examination of patient, obtaining history from patient or surrogate, ordering and performing treatments and interventions, ordering and review of laboratory studies, ordering and review of radiographic studies, pulse oximetry and re-evaluation of patient's condition.     Carlisle Beers, MD 01/10/14 [phone]

## 2014-01-10 ENCOUNTER — Encounter (HOSPITAL_COMMUNITY): Payer: Self-pay | Admitting: Emergency Medicine

## 2014-01-10 DIAGNOSIS — I5032 Chronic diastolic (congestive) heart failure: Secondary | ICD-10-CM

## 2014-01-10 DIAGNOSIS — J9600 Acute respiratory failure, unspecified whether with hypoxia or hypercapnia: Secondary | ICD-10-CM

## 2014-01-10 DIAGNOSIS — I509 Heart failure, unspecified: Secondary | ICD-10-CM | POA: Diagnosis present

## 2014-01-10 LAB — PROCALCITONIN: Procalcitonin: 0.28 ng/mL

## 2014-01-10 LAB — BASIC METABOLIC PANEL
BUN: 26 mg/dL — ABNORMAL HIGH (ref 6–23)
BUN: 28 mg/dL — ABNORMAL HIGH (ref 6–23)
CO2: 27 mEq/L (ref 19–32)
CO2: 26 mEq/L (ref 19–32)
Calcium: 8.4 mg/dL (ref 8.4–10.5)
Calcium: 8.7 mg/dL (ref 8.4–10.5)
Chloride: 95 mEq/L — ABNORMAL LOW (ref 96–112)
Chloride: 98 mEq/L (ref 96–112)
Creatinine, Ser: 2.13 mg/dL — ABNORMAL HIGH (ref 0.50–1.10)
Creatinine, Ser: 2.24 mg/dL — ABNORMAL HIGH (ref 0.50–1.10)
GFR calc Af Amer: 27 mL/min — ABNORMAL LOW (ref >90)
GFR calc Af Amer: 26 mL/min — ABNORMAL LOW (ref >90)
GFR calc non Af Amer: 24 mL/min — ABNORMAL LOW (ref >90)
GFR calc non Af Amer: 22 mL/min — ABNORMAL LOW (ref >90)
Glucose, Bld: 331 mg/dL — ABNORMAL HIGH (ref 70–99)
Glucose, Bld: 267 mg/dL — ABNORMAL HIGH (ref 70–99)
Potassium: 3.3 mEq/L — ABNORMAL LOW (ref 3.7–5.3)
Potassium: 3.3 mEq/L — ABNORMAL LOW (ref 3.7–5.3)
Sodium: 135 mEq/L — ABNORMAL LOW (ref 137–147)
Sodium: 138 mEq/L (ref 137–147)

## 2014-01-10 LAB — GLUCOSE, CAPILLARY
Glucose-Capillary: 297 mg/dL — ABNORMAL HIGH (ref 70–99)
Glucose-Capillary: 45 mg/dL — ABNORMAL LOW (ref 70–99)
Glucose-Capillary: 75 mg/dL (ref 70–99)
Glucose-Capillary: 156 mg/dL — ABNORMAL HIGH (ref 70–99)
Glucose-Capillary: 252 mg/dL — ABNORMAL HIGH (ref 70–99)
Glucose-Capillary: 265 mg/dL — ABNORMAL HIGH (ref 70–99)
Glucose-Capillary: 62 mg/dL — ABNORMAL LOW (ref 70–99)

## 2014-01-10 LAB — CBC
HCT: 30.9 % — ABNORMAL LOW (ref 36.0–46.0)
Hemoglobin: 10 g/dL — ABNORMAL LOW (ref 12.0–15.0)
MCH: 27.8 pg (ref 26.0–34.0)
MCHC: 32.4 g/dL (ref 30.0–36.0)
MCV: 85.8 fL (ref 78.0–100.0)
Platelets: 295 10*3/uL (ref 150–400)
RBC: 3.6 MIL/uL — ABNORMAL LOW (ref 3.87–5.11)
RDW: 13.4 % (ref 11.5–15.5)
WBC: 10.3 10*3/uL (ref 4.0–10.5)

## 2014-01-10 LAB — TROPONIN I
Troponin I: <0.3 ng/mL (ref <0.30)
Troponin I: <0.3 ng/mL (ref <0.30)
Troponin I: <0.3 ng/mL (ref <0.30)

## 2014-01-10 LAB — MAGNESIUM
Magnesium: 1.6 mg/dL (ref 1.5–2.5)
Magnesium: 2.9 mg/dL — ABNORMAL HIGH (ref 1.5–2.5)

## 2014-01-10 LAB — PHOSPHORUS
Phosphorus: 3.7 mg/dL (ref 2.3–4.6)
Phosphorus: 3 mg/dL (ref 2.3–4.6)

## 2014-01-10 LAB — MRSA PCR SCREENING: MRSA by PCR: POSITIVE — AB

## 2014-01-10 MED ORDER — HEPARIN SODIUM (PORCINE) 5000 UNIT/ML IJ SOLN
5000.0000 [IU] | Freq: Three times a day (TID) | INTRAMUSCULAR | Status: DC
  Administered 2014-01-10 – 2014-01-11 (×5): 5000 [IU] via SUBCUTANEOUS
  Filled 2014-01-10 (×8): qty 1

## 2014-01-10 MED ORDER — SODIUM CHLORIDE 0.9 % IV SOLN: 250.0000 mL | INTRAVENOUS | Status: DC | PRN

## 2014-01-10 MED ORDER — IRBESARTAN 300 MG PO TABS
300.0000 mg | ORAL_TABLET | Freq: Every day | ORAL | Status: DC
  Administered 2014-01-10 – 2014-01-12 (×3): 300 mg via ORAL
  Filled 2014-01-10 (×3): qty 1

## 2014-01-10 MED ORDER — BIOTENE DRY MOUTH MT LIQD
15.0000 mL | Freq: Two times a day (BID) | OROMUCOSAL | Status: DC
  Administered 2014-01-10 – 2014-01-12 (×4): 15 mL via OROMUCOSAL

## 2014-01-10 MED ORDER — ASPIRIN 81 MG PO CHEW: 324.0000 mg | CHEWABLE_TABLET | ORAL | Status: AC

## 2014-01-10 MED ORDER — HYDRALAZINE HCL 20 MG/ML IJ SOLN
5.0000 mg | INTRAMUSCULAR | Status: DC | PRN
  Administered 2014-01-10: 5 mg via INTRAVENOUS
  Filled 2014-01-10: qty 1

## 2014-01-10 MED ORDER — MUPIROCIN 2 % EX OINT
1.0000 "application " | TOPICAL_OINTMENT | Freq: Two times a day (BID) | CUTANEOUS | Status: DC
  Administered 2014-01-10 – 2014-01-12 (×5): 1 via NASAL
  Filled 2014-01-10 (×2): qty 22

## 2014-01-10 MED ORDER — ATORVASTATIN CALCIUM 20 MG PO TABS
20.0000 mg | ORAL_TABLET | Freq: Every day | ORAL | Status: DC
  Administered 2014-01-10 – 2014-01-11 (×2): 20 mg via ORAL
  Filled 2014-01-10 (×3): qty 1

## 2014-01-10 MED ORDER — AMLODIPINE BESYLATE 10 MG PO TABS
10.0000 mg | ORAL_TABLET | Freq: Every day | ORAL | Status: DC
  Administered 2014-01-10 – 2014-01-12 (×3): 10 mg via ORAL
  Filled 2014-01-10 (×3): qty 1

## 2014-01-10 MED ORDER — SODIUM CHLORIDE 0.9 % IJ SOLN: 3.0000 mL | INTRAMUSCULAR | Status: DC | PRN

## 2014-01-10 MED ORDER — POTASSIUM CHLORIDE 10 MEQ/100ML IV SOLN: 10.0000 meq | INTRAVENOUS | Status: DC

## 2014-01-10 MED ORDER — INSULIN ASPART 100 UNIT/ML ~~LOC~~ SOLN
0.0000 [IU] | SUBCUTANEOUS | Status: DC
  Administered 2014-01-10: 8 [IU] via SUBCUTANEOUS
  Administered 2014-01-10: 3 [IU] via SUBCUTANEOUS

## 2014-01-10 MED ORDER — MAGNESIUM SULFATE 50 % IJ SOLN
4.0000 g | Freq: Once | INTRAVENOUS | Status: DC
  Filled 2014-01-10: qty 8

## 2014-01-10 MED ORDER — POTASSIUM CHLORIDE 10 MEQ/100ML IV SOLN
10.0000 meq | INTRAVENOUS | Status: AC
  Administered 2014-01-10 (×2): 10 meq via INTRAVENOUS

## 2014-01-10 MED ORDER — POTASSIUM CHLORIDE 10 MEQ/100ML IV SOLN
10.0000 meq | Freq: Once | INTRAVENOUS | Status: DC
  Filled 2014-01-10: qty 100

## 2014-01-10 MED ORDER — ARFORMOTEROL TARTRATE 15 MCG/2ML IN NEBU
15.0000 ug | INHALATION_SOLUTION | Freq: Two times a day (BID) | RESPIRATORY_TRACT | Status: DC
  Administered 2014-01-10 – 2014-01-12 (×5): 15 ug via RESPIRATORY_TRACT
  Filled 2014-01-10 (×7): qty 2

## 2014-01-10 MED ORDER — FUROSEMIDE 10 MG/ML IJ SOLN
20.0000 mg | Freq: Once | INTRAMUSCULAR | Status: AC
  Administered 2014-01-10: 20 mg via INTRAVENOUS
  Filled 2014-01-10: qty 2

## 2014-01-10 MED ORDER — INSULIN ASPART 100 UNIT/ML ~~LOC~~ SOLN
0.0000 [IU] | Freq: Three times a day (TID) | SUBCUTANEOUS | Status: DC
  Administered 2014-01-10: 8 [IU] via SUBCUTANEOUS
  Administered 2014-01-11: 3 [IU] via SUBCUTANEOUS
  Administered 2014-01-11: 8 [IU] via SUBCUTANEOUS
  Administered 2014-01-12 (×2): 3 [IU] via SUBCUTANEOUS

## 2014-01-10 MED ORDER — HYDRALAZINE HCL 20 MG/ML IJ SOLN
10.0000 mg | INTRAMUSCULAR | Status: DC | PRN
  Administered 2014-01-10: 10 mg via INTRAVENOUS
  Filled 2014-01-10: qty 2

## 2014-01-10 MED ORDER — ALBUTEROL SULFATE (2.5 MG/3ML) 0.083% IN NEBU: 2.5000 mg | INHALATION_SOLUTION | RESPIRATORY_TRACT | Status: DC | PRN

## 2014-01-10 MED ORDER — MAGNESIUM SULFATE 4000MG/100ML IJ SOLN
4.0000 g | Freq: Once | INTRAMUSCULAR | Status: AC
  Administered 2014-01-10: 4 g via INTRAVENOUS
  Filled 2014-01-10: qty 100

## 2014-01-10 MED ORDER — SODIUM CHLORIDE 0.9 % IJ SOLN
3.0000 mL | Freq: Two times a day (BID) | INTRAMUSCULAR | Status: DC
Start: 2014-01-10 — End: 2014-01-12
  Administered 2014-01-10 – 2014-01-11 (×3): 3 mL via INTRAVENOUS

## 2014-01-10 MED ORDER — BUDESONIDE 0.25 MG/2ML IN SUSP
0.5000 mg | Freq: Two times a day (BID) | RESPIRATORY_TRACT | Status: DC
  Administered 2014-01-10 – 2014-01-11 (×4): 0.5 mg via RESPIRATORY_TRACT
  Filled 2014-01-10 (×7): qty 4

## 2014-01-10 MED ORDER — NITROGLYCERIN IN D5W 200-5 MCG/ML-% IV SOLN: 2.0000 ug/min | INTRAVENOUS | Status: DC

## 2014-01-10 MED ORDER — ASPIRIN 300 MG RE SUPP: 300.0000 mg | RECTAL | Status: AC

## 2014-01-10 MED ORDER — GUAIFENESIN 100 MG/5ML PO SYRP
100.0000 mg | ORAL_SOLUTION | ORAL | Status: DC | PRN
Start: 2014-01-10 — End: 2014-01-12
  Administered 2014-01-10 – 2014-01-11 (×2): 100 mg via ORAL
  Filled 2014-01-10 (×2): qty 5

## 2014-01-10 MED ORDER — CARVEDILOL 25 MG PO TABS
25.0000 mg | ORAL_TABLET | Freq: Two times a day (BID) | ORAL | Status: DC
  Administered 2014-01-10 – 2014-01-12 (×4): 25 mg via ORAL
  Filled 2014-01-10 (×6): qty 1

## 2014-01-10 MED ORDER — CHLORHEXIDINE GLUCONATE CLOTH 2 % EX PADS
6.0000 | MEDICATED_PAD | Freq: Every day | CUTANEOUS | Status: DC
  Administered 2014-01-10 – 2014-01-12 (×3): 6 via TOPICAL

## 2014-01-10 MED ORDER — FUROSEMIDE 10 MG/ML IJ SOLN
40.0000 mg | Freq: Two times a day (BID) | INTRAMUSCULAR | Status: AC
  Administered 2014-01-10 (×2): 40 mg via INTRAVENOUS
  Filled 2014-01-10 (×2): qty 4

## 2014-01-10 MED ORDER — SPIRONOLACTONE-HCTZ 25-25 MG PO TABS: 1.0000 | ORAL_TABLET | Freq: Every day | ORAL | Status: DC

## 2014-01-10 NOTE — Progress Notes (Signed)
Pt transported to [place] room 22 on 2 lt nasal canula. Sats remained 95-97%. No WOB noted.  Pt was in no distress during transport.

## 2014-01-10 NOTE — Progress Notes (Signed)
Hypoglycemic Event  CBG: 45  Treatment: 15 GM carbohydrate snack finished at 0840  Symptoms: Shaky, just doesn't "feel right"  Follow-up CBG: Time:0901 CBG Result:75  Possible Reasons for Event: Other: pt states she has this happen sometimes first thing in the morning  Comments/MD notified:Dr. D. Rockey Situ, Winifred Olive  Remember to initiate Hypoglycemia Order Set & complete

## 2014-01-10 NOTE — H&P (Signed)
PULMONARY / CRITICAL CARE MEDICINE   Name: Destiny Day MRN: [id number] DOB: January 07, 1951    ADMISSION DATE:  01/09/2014 CONSULTATION DATE:  01/10/2014  REFERRING MD :  EDP PRIMARY SERVICE: PCCM  CHIEF COMPLAINT:  SOB  BRIEF PATIENT DESCRIPTION: 63 y.o. F with known CHF presents with dyspnea x 3 - 4 days.  In ED, CXR suggestive of edema, BNP 20.2K.  Pt started on BiPAP and PCCM consulted for admission.  SIGNIFICANT EVENTS / STUDIES:  6/14 - presented to ED for non-productive cough, given tussionex pearls. 6/16 - presented to ED again for dyspnea / SOB, presumed secondary to CHF exac.  LINES / TUBES: PIV  CULTURES: Blood 6/17 >>>  ANTIBIOTICS: Vanc 6/17 x 1 Zosyn 6/17 x 1  HISTORY OF PRESENT ILLNESS:  Destiny Day is a 63 y.o. F with PMH as outlined below.  She presented to the [facility] ED on 6/16 with complaints of SOB / dyspnea x 3 - 4 days.  She states that she actually came to ED on 6/14 for a non-productive cough and chest tightness.  At that time, she was given tussionex pearls which did help her cough and helped loosen her chest tightness. She states that she also had mild SOB at the time, but didn't think much of it.  SOB has persisted since then and on evening of 6/16 while on the couch watching TV, she got significant more dyspneic.  Her husband took her to the fire department that is near their house and EMS was dispatched. Upon arrival to ED, pt was tachypneic and hypertensive.  CXR suggested pulmonary edema and BNP was elevated at 20.2K  She was started on BiPAP, nitro drip, lasix.  PCCM was consulted for admission.  PAST MEDICAL HISTORY :  Past Medical History  Diagnosis Date  . Hypertension   . Diverticulitis   . Hyperlipidemia   . Asthma   . Diabetes mellitus     insulin dependent  . Arthritis     knee  . GI bleed 03/31/2013  . CHF (congestive heart failure)   . Osteoporosis   . Seasonal allergies   . Anxiety    Past Surgical History  Procedure Laterality  Date  . Abdominal hysterectomy    . Esophagogastroduodenoscopy N/A 03/31/2013    Procedure: ESOPHAGOGASTRODUODENOSCOPY (EGD);  Surgeon: Gatha Mayer, MD;  Location: [facility] ENDOSCOPY;  Service: Endoscopy;  Laterality: N/A;   Prior to Admission medications   Medication Sig Start Date End Date Taking? Authorizing Provider  albuterol (PROVENTIL HFA;VENTOLIN HFA) 108 (90 BASE) MCG/ACT inhaler Inhale 2 puffs into the lungs every 6 (six) hours as needed for wheezing.   Yes Historical Provider, MD  amLODipine (NORVASC) 10 MG tablet Take 1 tablet (10 mg total) by mouth daily. 07/23/13  Yes Velvet Bathe, MD  aspirin 81 MG chewable tablet Chew 81 mg by mouth daily.   Yes Historical Provider, MD  atorvastatin (LIPITOR) 20 MG tablet Take 20 mg by mouth daily at 6 PM.   Yes Historical Provider, MD  budesonide-formoterol (SYMBICORT) 160-4.5 MCG/ACT inhaler Inhale 1 puff into the lungs every evening.   Yes Historical Provider, MD  Calcium Carbonate-Vit D-Min (CALCIUM 600+D PLUS MINERALS) 600-400 MG-UNIT TABS Take 1 tablet by mouth daily.   Yes Historical Provider, MD  carvedilol (COREG) 25 MG tablet Take 1 tablet (25 mg total) by mouth 2 (two) times daily with a meal. 06/01/13  Yes Costin Karlyne Greenspan, MD  chlorpheniramine-HYDROcodone (TUSSIONEX PENNKINETIC ER) 10-8 MG/5ML LQCR Take 5 mLs  by mouth 2 (two) times daily. For cough, or pain 01/07/14  Yes Richarda Blade, MD  escitalopram (LEXAPRO) 10 MG tablet Take 10 mg by mouth daily as needed (ANXIETY).   Yes Historical Provider, MD  Ferrous Sulfate (IRON) 325 (65 FE) MG TABS Take 1 capsule by mouth daily.   Yes Historical Provider, MD  furosemide (LASIX) 40 MG tablet Take 1 tablet (40 mg total) by mouth 2 (two) times daily. 06/01/13  Yes Costin Karlyne Greenspan, MD  insulin aspart (NOVOLOG) 100 UNIT/ML injection Inject 0-8 Units into the skin 3 (three) times daily before meals. Sliding scale   Yes Historical Provider, MD  insulin detemir (LEVEMIR) 100 UNIT/ML injection Inject 10  Units into the skin at bedtime.   Yes Historical Provider, MD  isosorbide-hydrALAZINE (BIDIL) 20-37.5 MG per tablet Take 1 tablet by mouth 3 (three) times daily.   Yes Historical Provider, MD  loratadine (CLARITIN) 10 MG tablet Take 10 mg by mouth daily as needed for allergies.   Yes Historical Provider, MD  mometasone (NASONEX) 50 MCG/ACT nasal spray Place 2 sprays into both nostrils daily as needed (ALLERGIES).   Yes Historical Provider, MD  potassium chloride SA (K-DUR,KLOR-CON) 20 MEQ tablet Take 20 mEq by mouth 2 (two) times daily.   Yes Historical Provider, MD  Vitamin D, Ergocalciferol, (DRISDOL) 50000 UNITS CAPS capsule Take 50,000 Units by mouth every 7 (seven) days. On wednesday   Yes Historical Provider, MD   Allergies  Allergen Reactions  . Lisinopril Cough  . Prednisone Other (See Comments)    Excessive fluid buildup    FAMILY HISTORY:  Family History  Problem Relation Age of Onset  . Colon cancer Neg Hx    SOCIAL HISTORY:  reports that she quit smoking about 20 months ago. She has never used smokeless tobacco. She reports that she does not drink alcohol or use illicit drugs.  REVIEW OF SYSTEMS:  All negative; except for those that are bolded, which indicate positives.  Constitutional: weight loss, weight gain, night sweats, fevers, chills, fatigue, weakness.  HEENT: headaches, sore throat, sneezing, nasal congestion, post nasal drip, difficulty swallowing, tooth/dental problems, visual complaints, visual changes, ear aches. Neuro: difficulty with speech, weakness, numbness, ataxia. CV:  chest pain, orthopnea, PND, swelling in lower extremities, dizziness, palpitations, syncope.  Resp: cough, hemoptysis, dyspnea, wheezing. GI  heartburn, indigestion, abdominal pain, nausea, vomiting, diarrhea, constipation, change in bowel habits, loss of appetite, hematemesis, melena, hematochezia.  GU: dysuria, change in color of urine, urgency or frequency, flank pain, hematuria. MSK:  joint pain or swelling, decreased range of motion. Psych: change in mood or affect, depression, anxiety, suicidal ideations, homicidal ideations. Skin: rash, itching, bruising.   SUBJECTIVE: Feels much better on BiPAP.  VITAL SIGNS: Temp:  [96.4 F (35.8 C)] 96.4 F (35.8 C) (06/16 2320) Pulse Rate:  [97-147] 97 (06/17 0020) Resp:  [20-34] 20 (06/17 0020) BP: (176-258)/(98-162) 180/99 mmHg (06/17 0020) SpO2:  [99 %-100 %] 100 % (06/17 0020) FiO2 (%):  [50 %] 50 % (06/16 2340) HEMODYNAMICS:   VENTILATOR SETTINGS: Vent Mode:  [-] BIPAP FiO2 (%):  [50 %] 50 % Set Rate:  [15 bmp] 15 bmp INTAKE / OUTPUT: Intake/Output   None     PHYSICAL EXAMINATION: General: Pleasant elderly female, resting in bed, in NAD, on BiPAP. Neuro: A&O x 3, non-focal.  HEENT: [place]/AT. PERRL, sclerae anicteric.  BiPAP in place. Cardiovascular: RRR, no M/R/G.  Lungs: Respirations even and unlabored.  Crackles bilaterally. Abdomen: BS x 4, soft,  NT/ND.  Musculoskeletal: No gross deformities, no edema.  Skin: Intact, warm, no rashes.    LABS:  CBC  Recent Labs Lab 01/07/14 0121 01/09/14 2320 01/09/14 2326  WBC 7.7 9.4  --   HGB 9.9* 12.4 14.3  HCT 30.7* 37.8 42.0  PLT 286 382  --    Coag's  Recent Labs Lab 01/09/14 2320  INR 0.96   BMET  Recent Labs Lab 01/07/14 0121 01/09/14 2326  NA 137 136*  K 2.8* 3.3*  CL 96 104  CO2 26  --   BUN 28* 24*  CREATININE 2.35* 2.40*  GLUCOSE 276* 313*   Electrolytes  Recent Labs Lab 01/07/14 0121 01/09/14 2320  CALCIUM 8.4  --   MG  --  1.7   Sepsis Markers  Recent Labs Lab 01/09/14 2329  LATICACIDVEN 2.37*   ABG  Recent Labs Lab 01/09/14 2325  PHART 7.354  PCO2ART 49.3*  PO2ART 76.0*   Liver Enzymes No results found for this basename: AST, ALT, ALKPHOS, BILITOT, ALBUMIN,  in the last 168 hours Cardiac Enzymes  Recent Labs Lab 01/07/14 0122 01/09/14 2320  PROBNP 15049.0* 20261.0*   Glucose No results found for  this basename: GLUCAP,  in the last 168 hours  Imaging Dg Chest Portable 1 View  01/09/2014   CLINICAL DATA:  Shortness of breath.  EXAM: PORTABLE CHEST - 1 VIEW  COMPARISON:  Chest radiograph January 07, 2014  FINDINGS: Cardiac silhouette appears mildly enlarged, mediastinal silhouette is nonsuspicious, mildly calcified aortic knob. Mild central pulmonary vasculature congestion with decreasing interstitial prominence, right lower lobe airspace opacity. No pleural effusions. No pneumothorax. Moderate degenerate change of the shoulders. Multiple EKG lines overlie the patient and may obscure subtle underlying pathology.  IMPRESSION: Stable cardiomegaly, increasing interstitial prominence with right lower lobe airspace opacity concerning for pulmonary edema and confluent edema versus pneumonia. Recommend followup chest radiograph after treatment to verify improvement.   Electronically Signed   By: Elon Alas   On: 01/09/2014 23:34     ASSESSMENT / PLAN:  PULMONARY A: Pulmonary Edema Asthma HTN emergency contribution P:   - Nitro gtt, lasix, continuous BiPAP, will need interruption this am goal 1 hr off, 4 hr on cycle - Hold outpatient Symbicort. - Use Budesonide / Brovana while in hospital. - Albuterol q4hrs PRN wheezing. -neg balance further goals -HTn control, see cvs -pcxr in am   CARDIOVASCULAR A:  Acute on chronic diastolic CHF Exacerbation HTN emegrency Hx HLD P:  - Hold outpatient Amlodipine, Atorvastatin, Carvedilol, Hydralazine while NPO. -likely to start orals this am  - Nitro gtt. - Lasix to neg balance - Echo (last one in 2014), reviewed prior with grade 2 diastolic dz -[id number] reduction from highest MAP from admission  RENAL A:   AKI Hypokalemia P:   - Lasix required - K repletion. -mag supp - BMP in AM. -bmet, mg, phos in afternoon -kvo  GASTROINTESTINAL A:   Nutrition P:   - NPO for now -re eval off nimv to determine diet  HEMATOLOGIC A: DVt  prveention No acute issues. P:  - CBC in AM -sub q hep  INFECTIOUS A:  Concern for PNA - doubt so given no productive cough, leukocytosis.  CXR appears more like edema. P:   - D/c vanc / zosyn as low suspicion for PNA. - Monitor fever curve / WBC's. - PCT algo to  Limit abx  ENDOCRINE A:  DM  P:   - CBG's q4h. - SSI.  NEUROLOGIC A:  No acute issues. P:   - Hold outpatient Lexapro while NPO. - onitor.  Global:  Admit to SDU.  Goal to reduce bipap, BP control, likley to add oral meds, NTG, hydral, echo, trop   Montey Hora, PA - [facility] Pgr: [phone] - [phone]  or [phone] [phone]  I have personally obtained a history, examined the patient, evaluated laboratory and imaging results, formulated the assessment and plan and placed orders.  Lavon Paganini. Titus Mould, MD, [place] Pgr: [facility]  Pulmonary and [place] Pager: [phone]  01/10/2014, 1:45 AM

## 2014-01-10 NOTE — Consult Note (Signed)
CARDIOLOGY CONSULT NOTE  Patient ID: Destiny Day MRN: [id number] DOB/AGE: 02-Aug-1950 63 y.o.  Admit date: 01/09/2014 Referring Physician  Merrie Roof, MD Primary Physician:  Velna Hatchet, MD Reason for Consultation  CHF  HPI: Patient is a 63 year old after female with history of difficult to control hypertension, uncontrolled diabetes mellitus, multiple hospital admissions due to noncompliance who had been doing well and was recently evaluated in the emergency room one-week ago when she complained of cough. She was discharged home on cough medicine. Over the weekend she developed chest pain worsening shortness of breath and was admitted to the hospital with hypertensive emergency and congestive heart failure. I was consulted for the management of hypertension and CHF.  Patient states that since being in the hospital she is feeling better, has not had any further chest pain. She is able to lay flat, but feels generally weak and tired. No neurologic weakness. No headache, no visual disturbances.  On further questioning, patient states that she stopped taking BiDil as the pharmacy did not have it for a few days and also patient previously was on Edarbyclor 40/25 mg by mouth daily which had controlled her blood pressure very well. She said that the medication had been discontinued as per pharmacy and hence stopped taking the medication about 10 days to 2 weeks ago.  Past Medical History  Diagnosis Date  . Hypertension   . Diverticulitis   . Hyperlipidemia   . Asthma   . Diabetes mellitus     insulin dependent  . Arthritis     knee  . GI bleed 03/31/2013  . CHF (congestive heart failure)   . Osteoporosis   . Seasonal allergies   . Anxiety      Past Surgical History  Procedure Laterality Date  . Abdominal hysterectomy    . Esophagogastroduodenoscopy N/A 03/31/2013    Procedure: ESOPHAGOGASTRODUODENOSCOPY (EGD);  Surgeon: Gatha Mayer, MD;  Location: [facility] ENDOSCOPY;  Service:  Endoscopy;  Laterality: N/A;     Family History  Problem Relation Age of Onset  . Colon cancer Neg Hx      Social History: History   Social History  . Marital Status: Married    Spouse Name: N/A    Number of Children: N/A  . Years of Education: N/A   Occupational History  . Not on file.   Social History Main Topics  . Smoking status: Former Smoker    Quit date: 05/10/2012  . Smokeless tobacco: Never Used  . Alcohol Use: No  . Drug Use: No  . Sexual Activity: Not on file   Other Topics Concern  . Not on file   Social History Narrative  . No narrative on file     Prescriptions prior to admission  Medication Sig Dispense Refill  . albuterol (PROVENTIL HFA;VENTOLIN HFA) 108 (90 BASE) MCG/ACT inhaler Inhale 2 puffs into the lungs every 6 (six) hours as needed for wheezing.      Marland Kitchen amLODipine (NORVASC) 10 MG tablet Take 1 tablet (10 mg total) by mouth daily.  30 tablet  0  . aspirin 81 MG chewable tablet Chew 81 mg by mouth daily.      Marland Kitchen atorvastatin (LIPITOR) 20 MG tablet Take 20 mg by mouth daily at 6 PM.      . budesonide-formoterol (SYMBICORT) 160-4.5 MCG/ACT inhaler Inhale 1 puff into the lungs every evening.      . Calcium Carbonate-Vit D-Min (CALCIUM 600+D PLUS MINERALS) 600-400 MG-UNIT TABS Take 1 tablet by mouth daily.      Marland Kitchen  carvedilol (COREG) 25 MG tablet Take 1 tablet (25 mg total) by mouth 2 (two) times daily with a meal.  60 tablet  1  . chlorpheniramine-HYDROcodone (TUSSIONEX PENNKINETIC ER) 10-8 MG/5ML LQCR Take 5 mLs by mouth 2 (two) times daily. For cough, or pain  120 mL  0  . escitalopram (LEXAPRO) 10 MG tablet Take 10 mg by mouth daily as needed (ANXIETY).      . Ferrous Sulfate (IRON) 325 (65 FE) MG TABS Take 1 capsule by mouth daily.      . furosemide (LASIX) 40 MG tablet Take 1 tablet (40 mg total) by mouth 2 (two) times daily.  60 tablet  1  . insulin aspart (NOVOLOG) 100 UNIT/ML injection Inject 0-8 Units into the skin 3 (three) times daily before  meals. Sliding scale      . insulin detemir (LEVEMIR) 100 UNIT/ML injection Inject 10 Units into the skin at bedtime.      . isosorbide-hydrALAZINE (BIDIL) 20-37.5 MG per tablet Take 1 tablet by mouth 3 (three) times daily.      Marland Kitchen loratadine (CLARITIN) 10 MG tablet Take 10 mg by mouth daily as needed for allergies.      . mometasone (NASONEX) 50 MCG/ACT nasal spray Place 2 sprays into both nostrils daily as needed (ALLERGIES).      . potassium chloride SA (K-DUR,KLOR-CON) 20 MEQ tablet Take 20 mEq by mouth 2 (two) times daily.      . Vitamin D, Ergocalciferol, (DRISDOL) 50000 UNITS CAPS capsule Take 50,000 Units by mouth every 7 (seven) days. On wednesday        Scheduled Meds: . amLODipine  10 mg Oral Daily  . antiseptic oral rinse  15 mL Mouth Rinse BID  . arformoterol  15 mcg Nebulization BID  . aspirin  324 mg Oral NOW   Or  . aspirin  300 mg Rectal NOW  . atorvastatin  20 mg Oral q1800  . budesonide  0.5 mg Nebulization BID  . carvedilol  25 mg Oral BID WC  . Chlorhexidine Gluconate Cloth  6 each Topical Q0600  . heparin  5,000 Units Subcutaneous 3 times per day  . insulin aspart  0-15 Units Subcutaneous TID WC  . irbesartan  300 mg Oral Daily  . mupirocin ointment  1 application Nasal BID  . potassium chloride  10 mEq Intravenous Once  . sodium chloride  3 mL Intravenous Q12H   Continuous Infusions: . nitroGLYCERIN 10 mcg/min (01/10/14 0700)   PRN Meds:.sodium chloride, sodium chloride, albuterol, hydrALAZINE, sodium chloride  ROS: General: no fevers/chills/night sweats Eyes: no blurry vision, diplopia, or amaurosis ENT: no sore throat or hearing loss CV: no edema or palpitations GI: no abdominal pain, nausea, vomiting, diarrhea, or constipation GU: no dysuria, frequency, or hematuria Skin: no rash Neuro: no headache, numbness, tingling, or weakness of extremities Musculoskeletal: no joint pain or swelling Heme: no bleeding, DVT, or easy bruising Endo: no polydipsia  or polyuria   Physical Exam: Blood pressure 201/95, pulse 117, temperature 98.3 F (36.8 C), temperature source Oral, resp. rate 18, height 5\' 5"  (1.651 m), weight 68 kg (149 lb 14.6 oz), SpO2 100.00%.   General appearance: alert, cooperative, appears stated age and no distress Neck: no adenopathy, no carotid bruit, no JVD and thyroid not enlarged, symmetric, no tenderness/mass/nodules Lungs: clear to auscultation bilaterally Heart: S1, S2 normal, S4 present, no rub and No murmur Abdomen: soft, non-tender; bowel sounds normal; no masses,  no organomegaly Extremities: extremities normal, atraumatic, no  cyanosis or edema Pulses:  Carotids soft bruit bilateral. Extremities: Femoral pulse normal. bilateral bruit present. Popliteal pulse 1 plus ; Pedal pulse feeble. Prominent abdominal bruit heard. Otherwise No prominent pulse felt in the abdomen. Neurologic: Grossly normal  Labs:   Lab Results  Component Value Date   WBC 10.3 01/10/2014   HGB 10.0* 01/10/2014   HCT 30.9* 01/10/2014   MCV 85.8 01/10/2014   PLT 295 01/10/2014    Recent Labs Lab 01/10/14 1625  NA 138  K 3.3*  CL 98  CO2 26  BUN 28*  CREATININE 2.24*  CALCIUM 8.7  GLUCOSE 267*   Lab Results  Component Value Date   CKTOTAL 73 08/23/2011   CKMB 3.4 08/23/2011   TROPONINI <0.30 01/10/2014    Lipid Panel     Component Value Date/Time   CHOL 184 12/01/2010 0455   TRIG 86 12/01/2010 0455   HDL 88 12/01/2010 0455   CHOLHDL 2.1 12/01/2010 0455   VLDL 17 12/01/2010 0455   LDLCALC  Value: 79        Total Cholesterol/HDL:CHD Risk Coronary Heart Disease Risk Table                     Men   Women  1/2 Average Risk   3.4   3.3  Average Risk       5.0   4.4  2 X Average Risk   9.6   7.1  3 X Average Risk  23.4   11.0        Use the calculated Patient Ratio above and the CHD Risk Table to determine the patient's CHD Risk.        ATP III CLASSIFICATION (LDL):  <100     mg/dL   Optimal  100-129  mg/dL   Near or Above                     Optimal  130-159  mg/dL   Borderline  160-189  mg/dL   High  >190     mg/dL   Very High 12/01/2010 0455    EKG: EKG 01/09/2014: Normal sinus rhythm, left hypertrophy nonspecific T. abnormality. EKG performed 01/09/2014 at 11:14 PM reveals sinus tachycardia at a rate of 150 beats a minute. I cannot exclude brief episode of atrial tachycardia.   Radiology: Dg Chest Portable 1 View  01/09/2014   CLINICAL DATA:  Shortness of breath.  EXAM: PORTABLE CHEST - 1 VIEW  COMPARISON:  Chest radiograph January 07, 2014  FINDINGS: Cardiac silhouette appears mildly enlarged, mediastinal silhouette is nonsuspicious, mildly calcified aortic knob. Mild central pulmonary vasculature congestion with decreasing interstitial prominence, right lower lobe airspace opacity. No pleural effusions. No pneumothorax. Moderate degenerate change of the shoulders. Multiple EKG lines overlie the patient and may obscure subtle underlying pathology.  IMPRESSION: Stable cardiomegaly, increasing interstitial prominence with right lower lobe airspace opacity concerning for pulmonary edema and confluent edema versus pneumonia. Recommend followup chest radiograph after treatment to verify improvement.   Electronically Signed   By: Elon Alas   On: 01/09/2014 23:34    Renal duplex 03/14/12: Normal renal duplex without stenosis. Abdominal aorta mild atheresclerotic plaque.  Echocardiogram 01/10/2014: Low normal LV systolic function, EF 99991111. Grade 3 diastolic dysfunction with elevated left ventricle end-diastolic and left atrial pressure. IVC was dilated suggestive of elevated right heart pressure. No other significant valvular abnormalities.   ASSESSMENT AND PLAN:  1. Acute on chronic diastolic heart failure secondary to  noncompliance with medication. 2. 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 3. Chronic kidney disease stage IV, 09/25/2013: Serum glucose elevated at 174 mg. BUN 31, serum creatinine 2.05. E. GFR 29 mL.  serum creatinine stable in spite of use of high dose of ARB. 4. Abdominal atherosclerosis and abdominal bruit 5. History of tobacco use disorder, patient now history smokes, previously heavy smoker 6. Hypertensive crisis with acute pulmonary edema  Recommendation: I again discussed with the patient and her husband at the bedside regarding compliance with medications and to contact me if there is any issues regarding nonavailability of the medications. I reviewed her outpatient medical records and also her medications, she started her outpatient medications, patient's blood pressure had been well controlled on ARB Edarbyclor and in spite of use of maximum dose of the medication, her serum creatinine had remained stable. I will restart ARB here in the hospital and at this point continue IV Lasix for diuresis. And then to follow the patient along with you.  I suspect patient will Monday have significant cardiovascular event due to noncompliance with diabetes control and hypertension control and continued smoking although she says she is completely quit husband feels that she probably smokes occasionally when she is stressed.  Laverda Page, MD 01/10/2014, 7:13 PM [facility]. [place] Pager: [phone] Office: [phone] If no answer Cell [phone]

## 2014-01-10 NOTE — Progress Notes (Signed)
eLink Physician-Brief Progress Note Patient Name: Destiny Day DOB: 07/28/1950 MRN: [id number]  Date of Service  01/10/2014   HPI/Events of Note   Hypertension on nitro gtt MAP on admission was 190! MAP goal ~140  eICU Interventions  Nitro gtt and prn hydralazine titrated to SBP < 180   Intervention Category Intermediate Interventions: Hypertension - evaluation and management  MCQUAID, DOUGLAS 01/10/2014, 3:35 AM

## 2014-01-10 NOTE — Progress Notes (Signed)
Echocardiogram 2D Echocardiogram has been performed.  [name], Destiny Day, 9:04 AM

## 2014-01-11 ENCOUNTER — Inpatient Hospital Stay (HOSPITAL_COMMUNITY): Payer: BC Managed Care – PPO

## 2014-01-11 DIAGNOSIS — I509 Heart failure, unspecified: Secondary | ICD-10-CM

## 2014-01-11 DIAGNOSIS — J9600 Acute respiratory failure, unspecified whether with hypoxia or hypercapnia: Secondary | ICD-10-CM

## 2014-01-11 DIAGNOSIS — I5032 Chronic diastolic (congestive) heart failure: Secondary | ICD-10-CM

## 2014-01-11 LAB — CBC WITH DIFFERENTIAL/PLATELET
Basophils Absolute: 0 10*3/uL (ref 0.0–0.1)
Basophils Relative: 0 % (ref 0–1)
Eosinophils Absolute: 0.2 10*3/uL (ref 0.0–0.7)
Eosinophils Relative: 3 % (ref 0–5)
HCT: 30.2 % — ABNORMAL LOW (ref 36.0–46.0)
Hemoglobin: 9.7 g/dL — ABNORMAL LOW (ref 12.0–15.0)
Lymphocytes Relative: 25 % (ref 12–46)
Lymphs Abs: 2 10*3/uL (ref 0.7–4.0)
MCH: 27.6 pg (ref 26.0–34.0)
MCHC: 32.1 g/dL (ref 30.0–36.0)
MCV: 85.8 fL (ref 78.0–100.0)
Monocytes Absolute: 0.5 10*3/uL (ref 0.1–1.0)
Monocytes Relative: 7 % (ref 3–12)
Neutro Abs: 5 10*3/uL (ref 1.7–7.7)
Neutrophils Relative %: 65 % (ref 43–77)
Platelets: 304 10*3/uL (ref 150–400)
RBC: 3.52 MIL/uL — ABNORMAL LOW (ref 3.87–5.11)
RDW: 13.4 % (ref 11.5–15.5)
WBC: 7.8 10*3/uL (ref 4.0–10.5)

## 2014-01-11 LAB — GLUCOSE, CAPILLARY
Glucose-Capillary: 166 mg/dL — ABNORMAL HIGH (ref 70–99)
Glucose-Capillary: 252 mg/dL — ABNORMAL HIGH (ref 70–99)
Glucose-Capillary: 61 mg/dL — ABNORMAL LOW (ref 70–99)
Glucose-Capillary: 70 mg/dL (ref 70–99)
Glucose-Capillary: 290 mg/dL — ABNORMAL HIGH (ref 70–99)

## 2014-01-11 LAB — PHOSPHORUS: Phosphorus: 4.1 mg/dL (ref 2.3–4.6)

## 2014-01-11 LAB — MAGNESIUM: Magnesium: 2.5 mg/dL (ref 1.5–2.5)

## 2014-01-11 LAB — BASIC METABOLIC PANEL
BUN: 31 mg/dL — ABNORMAL HIGH (ref 6–23)
CO2: 24 mEq/L (ref 19–32)
Calcium: 8.5 mg/dL (ref 8.4–10.5)
Chloride: 95 mEq/L — ABNORMAL LOW (ref 96–112)
Creatinine, Ser: 2.28 mg/dL — ABNORMAL HIGH (ref 0.50–1.10)
GFR calc Af Amer: 25 mL/min — ABNORMAL LOW (ref >90)
GFR calc non Af Amer: 22 mL/min — ABNORMAL LOW (ref >90)
Glucose, Bld: 169 mg/dL — ABNORMAL HIGH (ref 70–99)
Potassium: 3.5 mEq/L — ABNORMAL LOW (ref 3.7–5.3)
Sodium: 136 mEq/L — ABNORMAL LOW (ref 137–147)

## 2014-01-11 MED ORDER — AMIODARONE HCL 200 MG PO TABS
400.0000 mg | ORAL_TABLET | Freq: Three times a day (TID) | ORAL | Status: DC
  Administered 2014-01-11 – 2014-01-12 (×4): 400 mg via ORAL
  Filled 2014-01-11 (×7): qty 2

## 2014-01-11 MED ORDER — ONDANSETRON HCL 4 MG/2ML IJ SOLN
INTRAMUSCULAR | Status: AC
  Filled 2014-01-11: qty 2

## 2014-01-11 MED ORDER — ISOSORB DINITRATE-HYDRALAZINE 20-37.5 MG PO TABS
2.0000 | ORAL_TABLET | Freq: Three times a day (TID) | ORAL | Status: DC
  Administered 2014-01-11 – 2014-01-12 (×4): 2 via ORAL
  Filled 2014-01-11 (×7): qty 2

## 2014-01-11 MED ORDER — ONDANSETRON HCL 4 MG/2ML IJ SOLN
4.0000 mg | Freq: Four times a day (QID) | INTRAMUSCULAR | Status: DC | PRN
  Administered 2014-01-11: 4 mg via INTRAVENOUS

## 2014-01-11 MED ORDER — ENOXAPARIN SODIUM 30 MG/0.3ML ~~LOC~~ SOLN
30.0000 mg | SUBCUTANEOUS | Status: DC
  Administered 2014-01-11: 30 mg via SUBCUTANEOUS
  Filled 2014-01-11 (×3): qty 0.3

## 2014-01-11 NOTE — Progress Notes (Signed)
Inpatient Diabetes Program Recommendations  AACE/ADA: New Consensus Statement on Inpatient Glycemic Control (2013)  Target Ranges:  Prepandial:   less than 140 mg/dL      Peak postprandial:   less than 180 mg/dL (1-2 hours)      Critically ill patients:  140 - 180 mg/dL  Results for BUFFEY, THURLOW (MRN [id number]) as of 01/11/2014 09:52  Ref. Range 01/10/2014 12:11 01/10/2014 16:30 01/10/2014 16:31 01/10/2014 21:07 01/11/2014 08:12  Glucose-Capillary Latest Range: 70-99 mg/dL 156 (H) 265 (H) 252 (H) 62 (L) 166 (H)    Inpatient Diabetes Program Recommendations Insulin - Basal: Levemir 10 (home dose) Correction (SSI): decrease to sensitive scale  Thank you  Raoul Pitch BSN, RN,CDE Inpatient Diabetes Coordinator [phone] (team pager)

## 2014-01-11 NOTE — Progress Notes (Addendum)
RN assessed change in Pt's cardiac rhythm. EKG performed. Pt now in Wenckebach AV Block from NSR. Pt denies SOB or CP. HR in 90's BP 151/73. Cards MD paged. MD made aware of change. No new orders given. Central Telemetry made aware. RN will continue to assess and monitor pt's condition.

## 2014-01-11 NOTE — Progress Notes (Signed)
Off BiPAP X 24 hrs. No distress on RA. No new complaints  Filed Vitals:   01/11/14 0810 01/11/14 0847 01/11/14 1117 01/11/14 1200  BP: 183/116  154/86   Pulse:    94  Temp: 97.8 F (36.6 C)  97.7 F (36.5 C)   TempSrc: Oral  Oral   Resp: 23  24 12   Height:      Weight:      SpO2: 98% 98% 99%    NAD No JVD noted No wheezes Minimal bibasilar crackles RRR NABS Ext warm, no edema   BMET    Component Value Date/Time   NA 136* 01/11/2014 0241   K 3.5* 01/11/2014 0241   CL 95* 01/11/2014 0241   CO2 24 01/11/2014 0241   GLUCOSE 169* 01/11/2014 0241   BUN 31* 01/11/2014 0241   CREATININE 2.28* 01/11/2014 0241   CALCIUM 8.5 01/11/2014 0241   GFRNONAA 22* 01/11/2014 0241   GFRAA 25* 01/11/2014 0241    CBC    Component Value Date/Time   WBC 7.8 01/11/2014 0241   RBC 3.52* 01/11/2014 0241   RBC 4.49 11/30/2008 0825   HGB 9.7* 01/11/2014 0241   HCT 30.2* 01/11/2014 0241   PLT 304 01/11/2014 0241   MCV 85.8 01/11/2014 0241   MCH 27.6 01/11/2014 0241   MCHC 32.1 01/11/2014 0241   RDW 13.4 01/11/2014 0241   LYMPHSABS 2.0 01/11/2014 0241   MONOABS 0.5 01/11/2014 0241   EOSABS 0.2 01/11/2014 0241   BASOSABS 0.0 01/11/2014 0241    CXR: Much improved edema  IMPRESSION: Acute resp failure, resolved Pulm edema, resolving Hypertensive emergency, controlled Acute on CKD - baseline Cr 1.9-2.2 range DM II   PLAN: DC BiPAP order Transfer to tele Transfer to [facility] service in AM 6/19 and PCCM to sign off  Discussed with Dr Sherral Hammers Cards following Cont current med Rx Caution with ARB in light of renal insuff   Merton Border, MD ; [facility] service Mobile [phone].  After 5:30 PM or weekends, call [phone]

## 2014-01-11 NOTE — Progress Notes (Signed)
Subjective:  Patient feels better today, shortness of breath has improved year no further chest pain.  Blood pressure continues to be uncontrolled.  Objective:  Vital Signs in the last 24 hours: Temp:  [97.7 F (36.5 C)-98.3 F (36.8 C)] 97.7 F (36.5 C) (06/18 1117) Pulse Rate:  [69-117] 102 (06/18 0800) Resp:  [8-28] 24 (06/18 1117) BP: (117-219)/(64-116) 154/86 mmHg (06/18 1117) SpO2:  [96 %-100 %] 99 % (06/18 1117) Weight:  [68.1 kg (150 lb 2.1 oz)] 68.1 kg (150 lb 2.1 oz) (06/18 0436)  Intake/Output from previous day: 06/17 0701 - 06/18 0700 In: 1392 [P.O.:1080; I.V.:312] Out: 900 [Urine:900]  Physical Exam:   General appearance: alert, cooperative, appears stated age and no distress  Neck: no adenopathy, no carotid bruit, no JVD and thyroid not enlarged, symmetric, no tenderness/mass/nodules  Lungs: clear to auscultation bilaterally  Heart: S1, S2 normal, S4 present, no rub and No murmur. Tachycardia present. Abdomen: soft, non-tender; bowel sounds normal; no masses, no organomegaly  Extremities: extremities normal, atraumatic, no cyanosis or edema  Pulses: Carotids normal. Abdominal bruit present.Pedal pulses feeble.  Lab Results: BMP  Recent Labs  01/10/14 0222 01/10/14 1625 01/11/14 0241  NA 135* 138 136*  K 3.3* 3.3* 3.5*  CL 95* 98 95*  CO2 27 26 24   GLUCOSE 331* 267* 169*  BUN 26* 28* 31*  CREATININE 2.13* 2.24* 2.28*  CALCIUM 8.4 8.7 8.5  GFRNONAA 24* 22* 22*  GFRAA 27* 26* 25*    CBC  Recent Labs Lab 01/11/14 0241  WBC 7.8  RBC 3.52*  HGB 9.7*  HCT 30.2*  PLT 304  MCV 85.8  MCH 27.6  MCHC 32.1  RDW 13.4  LYMPHSABS 2.0  MONOABS 0.5  EOSABS 0.2  BASOSABS 0.0    HEMOGLOBIN A1C Lab Results  Component Value Date   HGBA1C 12.2* 05/23/2013   MPG 303* 05/23/2013    Cardiac Panel (last 3 results)  Recent Labs  01/10/14 0950 01/10/14 1625 01/10/14 2155  TROPONINI <0.30 <0.30 <0.30    BNP (last 3 results)  Recent Labs  09/08/13 1909 01/07/14 0122 01/09/14 2320  PROBNP 10751.0* 15049.0* 20261.0*    TSH  Recent Labs  05/23/13 0852  TSH 2.307    CHOLESTEROL No results found for this basename: CHOL,  in the last 8760 hours  Hepatic Function Panel  Recent Labs  03/31/13 0225 04/13/13 1713 04/14/13 0245  PROT 7.4 7.1 6.2  ALBUMIN 3.0* 2.9* 2.6*  AST 20 18 15   ALT 17 18 14   ALKPHOS 142* 177* 132*  BILITOT 0.1* 0.1* 0.2*  BILIDIR <0.1  --   --   IBILI NOT CALCULATED  --   --    EKG: EKG 01/09/2014: Normal sinus rhythm, left hypertrophy nonspecific T. abnormality.  EKG performed 01/09/2014 at 11:14 PM reveals sinus tachycardia at a rate of 150 beats a minute. I cannot exclude brief episode of atrial tachycardia.  EKG 01/11/2014: ? Atrial tachycardia, paroxysmal.  Renal duplex 03/14/12 and also Dec 2014:  Normal renal duplex without stenosis. Abdominal aorta mild atheresclerotic plaque.  Assessment/Plan:  Malignant hypertension DM-2 with renal manifestation Rec: Added Bidil. Continue high dose ARB. S. Cr stable. Add Amiodarone for PSVT. Even if A. FIb/A. Flutter , high risk of bleed due to uncontrolled hypertension and non-compliance.    Laverda Page, M.D. 01/11/2014, 11:35 AM [facility] Pager: [phone] Office: [phone] If no answer: [phone]

## 2014-01-12 DIAGNOSIS — J810 Acute pulmonary edema: Secondary | ICD-10-CM

## 2014-01-12 DIAGNOSIS — N058 Unspecified nephritic syndrome with other morphologic changes: Secondary | ICD-10-CM

## 2014-01-12 DIAGNOSIS — I10 Essential (primary) hypertension: Secondary | ICD-10-CM

## 2014-01-12 DIAGNOSIS — I5033 Acute on chronic diastolic (congestive) heart failure: Principal | ICD-10-CM

## 2014-01-12 DIAGNOSIS — J45909 Unspecified asthma, uncomplicated: Secondary | ICD-10-CM

## 2014-01-12 DIAGNOSIS — E1129 Type 2 diabetes mellitus with other diabetic kidney complication: Secondary | ICD-10-CM

## 2014-01-12 LAB — GLUCOSE, CAPILLARY
Glucose-Capillary: 182 mg/dL — ABNORMAL HIGH (ref 70–99)
Glucose-Capillary: 131 mg/dL — ABNORMAL HIGH (ref 70–99)

## 2014-01-12 MED ORDER — ISOSORB DINITRATE-HYDRALAZINE 20-37.5 MG PO TABS: 2.0000 | ORAL_TABLET | Freq: Three times a day (TID) | ORAL | Status: DC

## 2014-01-12 MED ORDER — FUROSEMIDE 40 MG PO TABS: 60.0000 mg | ORAL_TABLET | Freq: Two times a day (BID) | ORAL | Status: DC

## 2014-01-12 MED ORDER — IRBESARTAN 150 MG PO TABS: 150.0000 mg | ORAL_TABLET | Freq: Every day | ORAL | Status: DC

## 2014-01-12 MED ORDER — AMIODARONE HCL 200 MG PO TABS: 200.0000 mg | ORAL_TABLET | Freq: Two times a day (BID) | ORAL | Status: DC

## 2014-01-12 MED ORDER — BUDESONIDE 0.5 MG/2ML IN SUSP
0.5000 mg | Freq: Two times a day (BID) | RESPIRATORY_TRACT | Status: DC
  Administered 2014-01-12: 0.5 mg via RESPIRATORY_TRACT
  Filled 2014-01-12 (×3): qty 2

## 2014-01-12 MED ORDER — FUROSEMIDE 10 MG/ML IJ SOLN
40.0000 mg | Freq: Two times a day (BID) | INTRAMUSCULAR | Status: DC
  Administered 2014-01-12: 40 mg via INTRAVENOUS
  Filled 2014-01-12: qty 4

## 2014-01-12 NOTE — Progress Notes (Signed)
Subjective:  Patient feels better today, shortness of breath has improved year no further chest pain.  Cough has improved and slept well. Blood pressure controlled.  Objective:  Vital Signs in the last 24 hours: Temp:  [97.5 F (36.4 C)-97.7 F (36.5 C)] 97.6 F (36.4 C) (06/19 0517) Pulse Rate:  [73-114] 76 (06/19 0517) Resp:  [12-24] 18 (06/19 0517) BP: (112-154)/(54-86) 123/63 mmHg (06/19 0517) SpO2:  [95 %-100 %] 98 % (06/19 0517) Weight:  [68.629 kg (151 lb 4.8 oz)-69.264 kg (152 lb 11.2 oz)] 69.264 kg (152 lb 11.2 oz) (06/19 0517)  Intake/Output from previous day: 06/18 0701 - 06/19 0700 In: 1167.5 [P.O.:1140; I.V.:27.5] Out: 1001 [Urine:1001]  Physical Exam:   General appearance: alert, cooperative, appears stated age and no distress  Neck: no adenopathy, no carotid bruit, no JVD and thyroid not enlarged, symmetric, no tenderness/mass/nodules  Lungs: clear to auscultation bilaterally  Heart: S1, S2 normal, S4 present, no rub and No murmur. Tachycardia present. Abdomen: soft, non-tender; bowel sounds normal; no masses, no organomegaly  Extremities: extremities normal, atraumatic, no cyanosis or edema  Pulses: Carotids normal. Abdominal bruit present.Pedal pulses feeble.  Lab Results: BMP  Recent Labs  01/10/14 0222 01/10/14 1625 01/11/14 0241  NA 135* 138 136*  K 3.3* 3.3* 3.5*  CL 95* 98 95*  CO2 27 26 24   GLUCOSE 331* 267* 169*  BUN 26* 28* 31*  CREATININE 2.13* 2.24* 2.28*  CALCIUM 8.4 8.7 8.5  GFRNONAA 24* 22* 22*  GFRAA 27* 26* 25*    CBC  Recent Labs Lab 01/11/14 0241  WBC 7.8  RBC 3.52*  HGB 9.7*  HCT 30.2*  PLT 304  MCV 85.8  MCH 27.6  MCHC 32.1  RDW 13.4  LYMPHSABS 2.0  MONOABS 0.5  EOSABS 0.2  BASOSABS 0.0    HEMOGLOBIN A1C Lab Results  Component Value Date   HGBA1C 12.2* 05/23/2013   MPG 303* 05/23/2013    Cardiac Panel (last 3 results)  Recent Labs  01/10/14 0950 01/10/14 1625 01/10/14 2155  TROPONINI <0.30 <0.30  <0.30    BNP (last 3 results)  Recent Labs  09/08/13 1909 01/07/14 0122 01/09/14 2320  PROBNP 10751.0* 15049.0* 20261.0*    TSH  Recent Labs  05/23/13 0852  TSH 2.307    CHOLESTEROL No results found for this basename: CHOL,  in the last 8760 hours  Hepatic Function Panel  Recent Labs  03/31/13 0225 04/13/13 1713 04/14/13 0245  PROT 7.4 7.1 6.2  ALBUMIN 3.0* 2.9* 2.6*  AST 20 18 15   ALT 17 18 14   ALKPHOS 142* 177* 132*  BILITOT 0.1* 0.1* 0.2*  BILIDIR <0.1  --   --   IBILI NOT CALCULATED  --   --    EKG: EKG 01/09/2014: Normal sinus rhythm, left hypertrophy nonspecific T. abnormality.  EKG performed 01/09/2014 at 11:14 PM reveals sinus tachycardia at a rate of 150 beats a minute. I cannot exclude brief episode of atrial tachycardia.  EKG 01/11/2014: ? Atrial tachycardia, paroxysmal.  Tele 01/12/2014: NSR Renal duplex 03/14/12 and also Dec 2014:  Normal renal duplex without stenosis. Abdominal aorta mild atheresclerotic plaque.  Assessment/Plan:  Malignant hypertension DM-2 with renal manifestation Atrial tachycardia probably atypical A. Flutter due to malignant hypertension  Rec:  BP now controlled, chf resolved. Continue Amiodarone at 200 mg bid on discharge and I will see her in 2 weeks. Although has stage 4 CKD, tolerated ARB in the past (Edarbyclor 40/25mg ), but we can try Avalide 300/25mg  for now  due to cost. Even if A. FIb/A. Flutter , high risk of bleed due to uncontrolled hypertension and non-compliance. OV in 2 weeks   Laverda Page, M.D. 01/12/2014, 8:36 AM [facility] Pager: [phone] Office: [phone] If no answer: [phone]

## 2014-01-12 NOTE — Discharge Summary (Signed)
Physician Discharge Summary  KALICIA WICHT [id number] DOB: 10-27-1950 DOA: 01/09/2014  PCP: Velna Hatchet, MD  Admit date: 01/09/2014 Discharge date: 01/12/2014  Time spent: >30 minutes  Recommendations for Outpatient Follow-up:  1. BMET to follow electrolytes and renal function 2. Reassess BP and adjust medications as needed 3. Patient will benefit of outpatient referral to establish care with nephrologist (given stage 4 CKD) 4. Close follow up of her CBG's and diabetes; further adjustments to hypoglycemic regimen as needed   Discharge Diagnoses:  Acute resp failure with hypoxia Malignant HTN Acute pulmonary edema Acute on chronic diastolic heart failure Asthma HLD DM CKD stage 4 Atrial fibrillation Hx of GI bleed  Discharge Condition: stable and improved. Discharged home. Will follow with PCP in 10 days and with cardiology (Dr. Einar Gip) on 01/23/14.  Diet recommendation: low sodium and low carbohydrates diet  Filed Weights   01/11/14 0436 01/11/14 1444 01/12/14 0517  Weight: 68.1 kg (150 lb 2.1 oz) 68.629 kg (151 lb 4.8 oz) 69.264 kg (152 lb 11.2 oz)    History of present illness:  63 year old after female with history of difficult to control hypertension, uncontrolled diabetes mellitus, multiple hospital admissions due to noncompliance who had been doing well and was recently evaluated in the emergency room one-week ago when she complained of cough. She was discharged home on cough medicine. Over the weekend she developed chest pain worsening shortness of breath and was admitted to the hospital with hypertensive emergency and congestive heart failure. I was consulted for the management of hypertension and CHF.  Patient states that since being in the hospital she is feeling better, has not had any further chest pain. She is able to lay flat, but feels generally weak and tired. No neurologic weakness. No headache, no visual disturbances. Of note; on further questioning,  patient states that she stopped taking BiDil as the pharmacy did not have it for a few days and also patient previously was on Edarbyclor 40/25 mg by mouth daily which had controlled her blood    Hospital Course:  1-Acute resp failure with hypoxia: due to pulmonary edema and acute on chronic diastolic heart failure -patient required BIPAP -resolved at discharge -no need for oxygen supplementation -discharge on adjusted dose of lasix and with instruction to follow a low sodium diet  2-Malignant HTN: -well controlled at discharge --patient discharge on BIDIL, avapro and adjusted dose of lasix (60mg  BID).  3-Acute pulmonary edema: due to malignant HTN and diastolic heart failure (acute on chronic) -as mentioned above; patient required BIPAP initially -then after controlling BP and aggressive diuresis patient SOB resolved and lung auscultation clear at discharge -medication has been adjusted for better BP control and to manage fluid better  4-Acute on chronic diastolic heart failure: grade 3 with preserved EF -recommended to take lasix 60mg  BID -to follow low sodium diet (less than 2 grams) -will follow with cardiology for further evaluation -she is already on B-blocker and ARB  5-Asthma: stable currently. No wheezing -will continue symbicort and PRN albuterol  6-HLD: continue statins  7-DM: continue current hypoglycemic regimen  8-CKD stage 4: stable. -Close follow up of renal function as an outpatient especially with adjustments done to lasix and initiation of ARB -will benefit of establishing care with nephrology  9-Atrial fibrillation: rate controlled and sinus at discharge -will discharge on amiodarone 200mg  BID and coreg 25BID -patient with hx of GIB; per cardiology rec's will use just ASA for now  10-Hx of GI bleed: no overt bleeding appreciated  during this admission   Procedures:  See below for x-ray reports   2-D echo: 01/10/14 - Left ventricle: The cavity size  was normal. There was moderate concentric hypertrophy. Systolic function was normal. The estimated ejection fraction was in the range of 50% to 55%. Wall motion was normal; there were no regional wall motion abnormalities. Doppler parameters are consistent with a reversible restrictive pattern, indicative of decreased left ventricular diastolic compliance and/or increased left atrial pressure (grade 3 diastolic dysfunction). Doppler parameters are consistent with both elevated ventricular end-diastolic filling pressure and elevated left atrial filling pressure. - Mitral valve: Calcified annulus. - Left atrium: The atrium was mildly dilated. - Pulmonary arteries: PA peak pressure: 38 mm Hg (S). - Pericardium, extracardiac: A trivial pericardial effusion was identified.  Consultations:  PCCM  Cardiology   Discharge Exam: Filed Vitals:   01/12/14 0928  BP: 139/72  Pulse: 66  Temp:   Resp:    General appearance: alert, cooperative, appears stated age and no distress  Neck: no adenopathy, no carotid bruit, no JVD and thyroid not enlarged, symmetric, no tenderness/mass/nodules  Lungs: clear to auscultation bilaterally  Heart: S1, S2 normal, S4 present, no rub and No murmur. Tachycardia present.  Abdomen: soft, non-tender; bowel sounds normal; no masses, no organomegaly  Extremities: extremities normal, atraumatic, no cyanosis or edema  Pulses: Carotids normal. Abdominal bruit present.Pedal pulses feeble.    Discharge Instructions You were cared for by a hospitalist during your hospital stay. If you have any questions about your discharge medications or the care you received while you were in the hospital after you are discharged, you can call the unit and asked to speak with the hospitalist on call if the hospitalist that took care of you is not available. Once you are discharged, your primary care physician will handle any further medical issues. Please note that NO REFILLS for  any discharge medications will be authorized once you are discharged, as it is imperative that you return to your primary care physician (or establish a relationship with a primary care physician if you do not have one) for your aftercare needs so that they can reassess your need for medications and monitor your lab values.  Discharge Instructions   Diet - low sodium heart healthy    Complete by:  As directed      Discharge instructions    Complete by:  As directed   Follow up with Dr. Einar Gip in 1 week Take medications as prescribed Check your weight on daily basis Follow a low sodium diet (less than 2 grams daily)            Medication List         albuterol 108 (90 BASE) MCG/ACT inhaler  Commonly known as:  PROVENTIL HFA;VENTOLIN HFA  Inhale 2 puffs into the lungs every 6 (six) hours as needed for wheezing.     amiodarone 200 MG tablet  Commonly known as:  PACERONE  Take 1 tablet (200 mg total) by mouth 2 (two) times daily.     amLODipine 10 MG tablet  Commonly known as:  NORVASC  Take 1 tablet (10 mg total) by mouth daily.     aspirin 81 MG chewable tablet  Chew 81 mg by mouth daily.     atorvastatin 20 MG tablet  Commonly known as:  LIPITOR  Take 20 mg by mouth daily at 6 PM.     budesonide-formoterol 160-4.5 MCG/ACT inhaler  Commonly known as:  SYMBICORT  Inhale 1  puff into the lungs every evening.     CALCIUM 600+D PLUS MINERALS 600-400 MG-UNIT Tabs  Take 1 tablet by mouth daily.     carvedilol 25 MG tablet  Commonly known as:  COREG  Take 1 tablet (25 mg total) by mouth 2 (two) times daily with a meal.     chlorpheniramine-HYDROcodone 10-8 MG/5ML Lqcr  Commonly known as:  TUSSIONEX PENNKINETIC ER  Take 5 mLs by mouth 2 (two) times daily. For cough, or pain     escitalopram 10 MG tablet  Commonly known as:  LEXAPRO  Take 10 mg by mouth daily as needed (ANXIETY).     furosemide 40 MG tablet  Commonly known as:  LASIX  Take 1.5 tablets (60 mg total) by  mouth 2 (two) times daily.     insulin aspart 100 UNIT/ML injection  Commonly known as:  novoLOG  Inject 0-8 Units into the skin 3 (three) times daily before meals. Sliding scale     insulin detemir 100 UNIT/ML injection  Commonly known as:  LEVEMIR  Inject 10 Units into the skin at bedtime.     irbesartan 150 MG tablet  Commonly known as:  AVAPRO  Take 1 tablet (150 mg total) by mouth daily.     Iron 325 (65 FE) MG Tabs  Take 1 capsule by mouth daily.     isosorbide-hydrALAZINE 20-37.5 MG per tablet  Commonly known as:  BIDIL  Take 2 tablets by mouth 3 (three) times daily.     loratadine 10 MG tablet  Commonly known as:  CLARITIN  Take 10 mg by mouth daily as needed for allergies.     mometasone 50 MCG/ACT nasal spray  Commonly known as:  NASONEX  Place 2 sprays into both nostrils daily as needed (ALLERGIES).     potassium chloride SA 20 MEQ tablet  Commonly known as:  K-DUR,KLOR-CON  Take 20 mEq by mouth 2 (two) times daily.     Vitamin D (Ergocalciferol) 50000 UNITS Caps capsule  Commonly known as:  DRISDOL  Take 50,000 Units by mouth every 7 (seven) days. On wednesday       Allergies  Allergen Reactions  . Lisinopril Cough  . Prednisone Other (See Comments)    Excessive fluid buildup       Follow-up Information   Follow up with Laverda Page, MD On 01/23/2014. (1245 pm come with all medications)    Specialty:  Cardiology   Contact information:   [place] [place] [address] [phone]       Follow up with Velna Hatchet, MD In 10 days.   Specialty:  Internal Medicine   Contact information:   [place] [address] [phone]        The results of significant diagnostics from this hospitalization (including imaging, microbiology, ancillary and laboratory) are listed below for reference.    Significant Diagnostic Studies: Dg Chest 2 View  01/07/2014   CLINICAL DATA:  Cough, substernal chest pain  EXAM:  CHEST  2 VIEW  COMPARISON:  09/08/2013  FINDINGS: Chronic interstitial markings. No focal consolidation. No pleural effusion or pneumothorax.  The heart is top-normal in size.  Degenerative changes of the visualized thoracolumbar spine.  IMPRESSION: No evidence of acute cardiopulmonary disease.   Electronically Signed   By: Julian Hy M.D.   On: 01/07/2014 02:10   Dg Chest Port 1 View  01/11/2014   CLINICAL DATA:  Pulmonary edema.  EXAM: PORTABLE CHEST - 1  VIEW  COMPARISON:  01/09/2014.  FINDINGS: Mediastinum and hilar structures unremarkable. Cardiomegaly with pulmonary vascular prominence and pulmonary interstitial edema, improved from prior exam. No pleural effusion or pneumothorax. No acute abnormality.  IMPRESSION: Congestive heart failure and pulmonary interstitial edema. Significant improvement from prior exam.   Electronically Signed   By: [place]   On: 01/11/2014 07:35   Dg Chest Portable 1 View  01/09/2014   CLINICAL DATA:  Shortness of breath.  EXAM: PORTABLE CHEST - 1 VIEW  COMPARISON:  Chest radiograph January 07, 2014  FINDINGS: Cardiac silhouette appears mildly enlarged, mediastinal silhouette is nonsuspicious, mildly calcified aortic knob. Mild central pulmonary vasculature congestion with decreasing interstitial prominence, right lower lobe airspace opacity. No pleural effusions. No pneumothorax. Moderate degenerate change of the shoulders. Multiple EKG lines overlie the patient and may obscure subtle underlying pathology.  IMPRESSION: Stable cardiomegaly, increasing interstitial prominence with right lower lobe airspace opacity concerning for pulmonary edema and confluent edema versus pneumonia. Recommend followup chest radiograph after treatment to verify improvement.   Electronically Signed   By: Elon Alas   On: 01/09/2014 23:34    Microbiology: Recent Results (from the past 240 hour(s))  CULTURE, BLOOD (ROUTINE X 2)     Status: None   Collection Time    01/10/14  12:10 AM      Result Value Ref Range Status   Specimen Description BLOOD LEFT ARM   Final   Special Requests BOTTLES DRAWN AEROBIC AND ANAEROBIC 10CC EACH   Final   Culture  Setup Time     Final   Value: 01/10/2014 08:39     Performed at [facility]   Culture     Final   Value:        BLOOD CULTURE RECEIVED NO GROWTH TO DATE CULTURE WILL BE HELD FOR 5 DAYS BEFORE ISSUING A FINAL NEGATIVE REPORT     Performed at [facility]   Report Status PENDING   Incomplete  CULTURE, BLOOD (ROUTINE X 2)     Status: None   Collection Time    01/10/14 12:20 AM      Result Value Ref Range Status   Specimen Description BLOOD LEFT HAND   Final   Special Requests BOTTLES DRAWN AEROBIC ONLY 5CC   Final   Culture  Setup Time     Final   Value: 01/10/2014 08:39     Performed at [facility]   Culture     Final   Value:        BLOOD CULTURE RECEIVED NO GROWTH TO DATE CULTURE WILL BE HELD FOR 5 DAYS BEFORE ISSUING A FINAL NEGATIVE REPORT     Performed at [facility]   Report Status PENDING   Incomplete  MRSA PCR SCREENING     Status: Abnormal   Collection Time    01/10/14  3:16 AM      Result Value Ref Range Status   MRSA by PCR POSITIVE (*) NEGATIVE Final   Comment:            The GeneXpert MRSA Assay (FDA     approved for NASAL specimens     only), is one component of a     comprehensive MRSA colonization     surveillance program. It is not     intended to diagnose MRSA     infection nor to guide or     monitor treatment for     MRSA infections.  RESULT CALLED TO, READ BACK BY AND VERIFIED WITH:     CALLED TO RN Fawn Kirk EK:[phone] @0545  THANEY     Labs: Basic Metabolic Panel:  Recent Labs Lab 01/07/14 0121 01/09/14 2320 01/09/14 2326 01/10/14 0222 01/10/14 1625 01/11/14 0241  NA 137  --  136* 135* 138 136*  K 2.8*  --  3.3* 3.3* 3.3* 3.5*  CL 96  --  104 95* 98 95*  CO2 26  --   --  27 26 24   GLUCOSE 276*  --  313* 331* 267* 169*  BUN 28*  --   24* 26* 28* 31*  CREATININE 2.35*  --  2.40* 2.13* 2.24* 2.28*  CALCIUM 8.4  --   --  8.4 8.7 8.5  MG  --  1.7  --  1.6 2.9* 2.5  PHOS  --   --   --  3.7 3.0 4.1   CBC:  Recent Labs Lab 01/07/14 0121 01/09/14 2320 01/09/14 2326 01/10/14 0222 01/11/14 0241  WBC 7.7 9.4  --  10.3 7.8  NEUTROABS  --  3.9  --   --  5.0  HGB 9.9* 12.4 14.3 10.0* 9.7*  HCT 30.7* 37.8 42.0 30.9* 30.2*  MCV 85.3 86.1  --  85.8 85.8  PLT 286 382  --  295 304   Cardiac Enzymes:  Recent Labs Lab 01/10/14 0950 01/10/14 1625 01/10/14 2155  TROPONINI <0.30 <0.30 <0.30   BNP: BNP (last 3 results)  Recent Labs  09/08/13 1909 01/07/14 0122 01/09/14 2320  PROBNP 10751.0* 15049.0* 20261.0*   CBG:  Recent Labs Lab 01/11/14 1516 01/11/14 1533 01/11/14 2252 01/12/14 0720 01/12/14 1123  GLUCAP 61* 70 290* 182* 131*    Signed:  Barton Dubois  [facility] 01/12/2014, 11:47 AM

## 2014-01-16 ENCOUNTER — Emergency Department (HOSPITAL_COMMUNITY)
Admission: EM | Admit: 2014-01-16 | Discharge: 2014-01-17 | Disposition: A | Discharge status: Home / Self Care | Payer: BC Managed Care – PPO | Attending: Emergency Medicine | Admitting: Emergency Medicine

## 2014-01-16 ENCOUNTER — Encounter (HOSPITAL_COMMUNITY): Payer: Self-pay | Admitting: Emergency Medicine

## 2014-01-16 ENCOUNTER — Emergency Department (HOSPITAL_COMMUNITY): Payer: BC Managed Care – PPO

## 2014-01-16 DIAGNOSIS — I509 Heart failure, unspecified: Secondary | ICD-10-CM | POA: Insufficient documentation

## 2014-01-16 DIAGNOSIS — J45901 Unspecified asthma with (acute) exacerbation: Secondary | ICD-10-CM | POA: Insufficient documentation

## 2014-01-16 DIAGNOSIS — Z87891 Personal history of nicotine dependence: Secondary | ICD-10-CM | POA: Insufficient documentation

## 2014-01-16 DIAGNOSIS — E785 Hyperlipidemia, unspecified: Secondary | ICD-10-CM | POA: Insufficient documentation

## 2014-01-16 DIAGNOSIS — E119 Type 2 diabetes mellitus without complications: Secondary | ICD-10-CM | POA: Insufficient documentation

## 2014-01-16 DIAGNOSIS — Z794 Long term (current) use of insulin: Secondary | ICD-10-CM | POA: Insufficient documentation

## 2014-01-16 DIAGNOSIS — Z8719 Personal history of other diseases of the digestive system: Secondary | ICD-10-CM | POA: Insufficient documentation

## 2014-01-16 DIAGNOSIS — I10 Essential (primary) hypertension: Secondary | ICD-10-CM | POA: Insufficient documentation

## 2014-01-16 DIAGNOSIS — IMO0002 Reserved for concepts with insufficient information to code with codable children: Secondary | ICD-10-CM | POA: Insufficient documentation

## 2014-01-16 DIAGNOSIS — Z7982 Long term (current) use of aspirin: Secondary | ICD-10-CM | POA: Insufficient documentation

## 2014-01-16 DIAGNOSIS — M1710 Unilateral primary osteoarthritis, unspecified knee: Secondary | ICD-10-CM | POA: Insufficient documentation

## 2014-01-16 DIAGNOSIS — Z79899 Other long term (current) drug therapy: Secondary | ICD-10-CM | POA: Insufficient documentation

## 2014-01-16 LAB — COMPREHENSIVE METABOLIC PANEL
ALT: 23 U/L (ref 0–35)
AST: 30 U/L (ref 0–37)
Albumin: 2.9 g/dL — ABNORMAL LOW (ref 3.5–5.2)
Alkaline Phosphatase: 140 U/L — ABNORMAL HIGH (ref 39–117)
BUN: 36 mg/dL — ABNORMAL HIGH (ref 6–23)
CO2: 24 mEq/L (ref 19–32)
Calcium: 8.8 mg/dL (ref 8.4–10.5)
Chloride: 99 mEq/L (ref 96–112)
Creatinine, Ser: 2.99 mg/dL — ABNORMAL HIGH (ref 0.50–1.10)
GFR calc Af Amer: 18 mL/min — ABNORMAL LOW (ref >90)
GFR calc non Af Amer: 16 mL/min — ABNORMAL LOW (ref >90)
Glucose, Bld: 227 mg/dL — ABNORMAL HIGH (ref 70–99)
Potassium: 4.5 mEq/L (ref 3.7–5.3)
Sodium: 137 mEq/L (ref 137–147)
Total Bilirubin: <0.2 mg/dL — ABNORMAL LOW (ref 0.3–1.2)
Total Protein: 7.2 g/dL (ref 6.0–8.3)

## 2014-01-16 LAB — CBC WITH DIFFERENTIAL/PLATELET
Basophils Absolute: 0 10*3/uL (ref 0.0–0.1)
Basophils Relative: 0 % (ref 0–1)
Eosinophils Absolute: 0.3 10*3/uL (ref 0.0–0.7)
Eosinophils Relative: 3 % (ref 0–5)
HCT: 28.8 % — ABNORMAL LOW (ref 36.0–46.0)
Hemoglobin: 9.3 g/dL — ABNORMAL LOW (ref 12.0–15.0)
Lymphocytes Relative: 25 % (ref 12–46)
Lymphs Abs: 2.1 10*3/uL (ref 0.7–4.0)
MCH: 27.5 pg (ref 26.0–34.0)
MCHC: 32.3 g/dL (ref 30.0–36.0)
MCV: 85.2 fL (ref 78.0–100.0)
Monocytes Absolute: 0.6 10*3/uL (ref 0.1–1.0)
Monocytes Relative: 7 % (ref 3–12)
Neutro Abs: 5.5 10*3/uL (ref 1.7–7.7)
Neutrophils Relative %: 65 % (ref 43–77)
Platelets: 337 10*3/uL (ref 150–400)
RBC: 3.38 MIL/uL — ABNORMAL LOW (ref 3.87–5.11)
RDW: 13.6 % (ref 11.5–15.5)
WBC: 8.6 10*3/uL (ref 4.0–10.5)

## 2014-01-16 LAB — CULTURE, BLOOD (ROUTINE X 2)
Culture: NO GROWTH
Culture: NO GROWTH

## 2014-01-16 LAB — PRO B NATRIURETIC PEPTIDE: Pro B Natriuretic peptide (BNP): 6445 pg/mL — ABNORMAL HIGH (ref 0–125)

## 2014-01-16 MED ORDER — FUROSEMIDE 10 MG/ML IJ SOLN
80.0000 mg | Freq: Once | INTRAMUSCULAR | Status: AC
  Administered 2014-01-16: 80 mg via INTRAVENOUS
  Filled 2014-01-16: qty 8

## 2014-01-16 NOTE — ED Notes (Addendum)
While placing pt on bedpan, pt became sob, oxygen sats decreased to 90%. Pt placed on nasal cannula  At 2L, oxygen increased to 95%. Pt axo, nad noted.

## 2014-01-16 NOTE — ED Notes (Signed)
Patient presents to ED via POV with husband. Patient states that her doctor changed her blood pressure medication on Friday and since then she has felt "tired." Patient also c/o of her stomach feeling "bloated" and her ankles "swelling." Patient is A&Ox4. Respirations even and unlabored, speaking in clear and complete sentences. No acute distress noted at this time.

## 2014-01-16 NOTE — ED Provider Notes (Signed)
CSN: [id number]     Arrival date & time 01/16/14  1949 History   First MD Initiated Contact with Patient 01/16/14 2034     Chief Complaint  Patient presents with  . Leg Swelling  . Bloated     (Consider location/radiation/quality/duration/timing/severity/associated sxs/prior Treatment) Patient is a 63 y.o. female presenting with general illness. The history is provided by the patient and medical records.  Illness Severity:  Moderate Onset quality:  Gradual Timing:  Constant Progression:  Worsening Chronicity:  Recurrent Associated symptoms: shortness of breath   Associated symptoms: no chest pain, no congestion, no cough, no diarrhea, no fever, no headaches, no rhinorrhea and no vomiting  Abdominal pain: tightness.     63 yo F pw fatigue, edema, abd distension, and sob. H/o CHF and severe hypertension. Since being discharged home ~5 days ago patient with worsening of above symptoms. Similar to prior. Although states abd distension worse than prior. Concerned it is related to starting Irbesarten and amiodarone recently. No cough or fevers. No chest pain. Mild abd tightness. Denies h/o liver problems.  Does endorse generalized fatigue.  No h/o DVT/PE. No leg pain.    Past Medical History  Diagnosis Date  . Hypertension   . Diverticulitis   . Hyperlipidemia   . Asthma   . Diabetes mellitus     insulin dependent  . Arthritis     knee  . GI bleed 03/31/2013  . CHF (congestive heart failure)   . Osteoporosis   . Seasonal allergies   . Anxiety    Past Surgical History  Procedure Laterality Date  . Abdominal hysterectomy    . Esophagogastroduodenoscopy N/A 03/31/2013    Procedure: ESOPHAGOGASTRODUODENOSCOPY (EGD);  Surgeon: Gatha Mayer, MD;  Location: [facility] ENDOSCOPY;  Service: Endoscopy;  Laterality: N/A;   Family History  Problem Relation Age of Onset  . Colon cancer Neg Hx    History  Substance Use Topics  . Smoking status: Former Smoker    Quit date: 05/10/2012  .  Smokeless tobacco: Never Used  . Alcohol Use: No   OB History   Grav Para Term Preterm Abortions TAB SAB Ect Mult Living                 Review of Systems  Constitutional: Negative for fever and chills.  HENT: Negative for congestion and rhinorrhea.   Respiratory: Positive for shortness of breath. Negative for cough.   Cardiovascular: Positive for leg swelling. Negative for chest pain.  Gastrointestinal: Negative for vomiting and diarrhea. Abdominal pain: tightness.  Genitourinary: Negative for dysuria, flank pain and difficulty urinating.  Neurological: Negative for dizziness and headaches.  All other systems reviewed and are negative.     Allergies  Lisinopril and Prednisone  Home Medications   Prior to Admission medications   Medication Sig Start Date End Date Taking? Authorizing Provider  albuterol (PROVENTIL HFA;VENTOLIN HFA) 108 (90 BASE) MCG/ACT inhaler Inhale 2 puffs into the lungs every 6 (six) hours as needed for wheezing.   Yes Historical Provider, MD  amiodarone (PACERONE) 200 MG tablet Take 1 tablet (200 mg total) by mouth 2 (two) times daily. 01/12/14  Yes Barton Dubois, MD  amLODipine (NORVASC) 10 MG tablet Take 1 tablet (10 mg total) by mouth daily. 07/23/13  Yes Velvet Bathe, MD  aspirin 81 MG chewable tablet Chew 81 mg by mouth daily.   Yes Historical Provider, MD  atorvastatin (LIPITOR) 20 MG tablet Take 20 mg by mouth daily at 6 PM.   Yes  Historical Provider, MD  budesonide-formoterol (SYMBICORT) 160-4.5 MCG/ACT inhaler Inhale 1 puff into the lungs every evening.   Yes Historical Provider, MD  Calcium Carbonate-Vit D-Min (CALCIUM 600+D PLUS MINERALS) 600-400 MG-UNIT TABS Take 1 tablet by mouth daily.   Yes Historical Provider, MD  carvedilol (COREG) 25 MG tablet Take 1 tablet (25 mg total) by mouth 2 (two) times daily with a meal. 06/01/13  Yes Costin Karlyne Greenspan, MD  chlorpheniramine-HYDROcodone (TUSSIONEX PENNKINETIC ER) 10-8 MG/5ML LQCR Take 5 mLs by mouth 2  (two) times daily. For cough, or pain 01/07/14  Yes Richarda Blade, MD  Ferrous Sulfate (IRON) 325 (65 FE) MG TABS Take 1 capsule by mouth daily.   Yes Historical Provider, MD  furosemide (LASIX) 40 MG tablet Take 1.5 tablets (60 mg total) by mouth 2 (two) times daily. 01/12/14  Yes Barton Dubois, MD  insulin aspart (NOVOLOG) 100 UNIT/ML injection Inject 0-8 Units into the skin 3 (three) times daily before meals. Sliding scale   Yes Historical Provider, MD  insulin detemir (LEVEMIR) 100 UNIT/ML injection Inject 10 Units into the skin at bedtime.   Yes Historical Provider, MD  irbesartan (AVAPRO) 150 MG tablet Take 1 tablet (150 mg total) by mouth daily. 01/12/14  Yes Barton Dubois, MD  isosorbide-hydrALAZINE (BIDIL) 20-37.5 MG per tablet Take 2 tablets by mouth 3 (three) times daily. 01/12/14  Yes Barton Dubois, MD  loratadine (CLARITIN) 10 MG tablet Take 10 mg by mouth daily as needed for allergies.   Yes Historical Provider, MD  mometasone (NASONEX) 50 MCG/ACT nasal spray Place 2 sprays into both nostrils daily as needed (ALLERGIES).   Yes Historical Provider, MD  potassium chloride SA (K-DUR,KLOR-CON) 20 MEQ tablet Take 20 mEq by mouth 2 (two) times daily.   Yes Historical Provider, MD  Vitamin D, Ergocalciferol, (DRISDOL) 50000 UNITS CAPS capsule Take 50,000 Units by mouth every 7 (seven) days. On wednesday   Yes Historical Provider, MD  escitalopram (LEXAPRO) 10 MG tablet Take 10 mg by mouth daily as needed (ANXIETY).    Historical Provider, MD   BP 192/81  Pulse 90  Temp(Src) 98.4 F (36.9 C) (Oral)  Resp 21  Ht 5\' 5"  (1.651 m)  Wt 151 lb (68.493 kg)  BMI 25.13 kg/m2  SpO2 99% Physical Exam  Nursing note and vitals reviewed. Constitutional: She is oriented to person, place, and time. She appears well-developed and well-nourished. No distress.  HENT:  Head: Normocephalic and atraumatic.  Eyes: Conjunctivae are normal. Right eye exhibits no discharge. Left eye exhibits no discharge.   Neck: No tracheal deviation present.  Cardiovascular: Normal heart sounds and intact distal pulses.   Pulmonary/Chest: Effort normal and breath sounds normal. No stridor. No respiratory distress. She has no wheezes. She has no rales.  Abdominal: Soft. She exhibits no distension (soft, obese. do not appreciate any distension on exam). There is no tenderness. There is no guarding.  Musculoskeletal: She exhibits edema. She exhibits no tenderness.  Neurological: She is alert and oriented to person, place, and time.  Skin: Skin is warm and dry.  Psychiatric: She has a normal mood and affect. Her behavior is normal.  some presacral edema.  ED Course  Procedures (including critical care time) Labs Review Labs Reviewed  PRO B NATRIURETIC PEPTIDE - Abnormal; Notable for the following:    Pro B Natriuretic peptide (BNP) 6445.0 (*)    All other components within normal limits  COMPREHENSIVE METABOLIC PANEL - Abnormal; Notable for the following:    Glucose,  Bld 227 (*)    BUN 36 (*)    Creatinine, Ser 2.99 (*)    Albumin 2.9 (*)    Alkaline Phosphatase 140 (*)    Total Bilirubin <0.2 (*)    GFR calc non Af Amer 16 (*)    GFR calc Af Amer 18 (*)    All other components within normal limits  CBC WITH DIFFERENTIAL - Abnormal; Notable for the following:    RBC 3.38 (*)    Hemoglobin 9.3 (*)    HCT 28.8 (*)    All other components within normal limits    Imaging Review Dg Chest Portable 1 View  01/16/2014   CLINICAL DATA:  Epigastric pain.  Shortness of breath.  EXAM: PORTABLE CHEST - 1 VIEW  COMPARISON:  01/11/2014  FINDINGS: Mild cardiomegaly with Kerley B-lines and interstitial accentuation. Currently no overt airspace opacity. The interstitial accentuation is increased from the exam 5 days ago.  Atherosclerotic aortic arch. Trace blunting of both costophrenic angles.  IMPRESSION: 1. Interstitial pulmonary edema, increased from the exam of 5 days ago. 2. Suspected trace bilateral pleural  effusions. 3. Cardiomegaly.   Electronically Signed   By: Sherryl Barters M.D.   On: 01/16/2014 21:22     EKG Interpretation   Date/Time:  Tuesday January 16 2014 19:58:06 EDT Ventricular Rate:  90 PR Interval:  181 QRS Duration: 87 QT Interval:  379 QTC Calculation: 464 R Axis:   53 Text Interpretation:  Sinus rhythm Anteroseptal infarct, old Nonspecific T  abnormalities, lateral leads Baseline wander in lead(s) V4 When compared  with ECG of 01/11/2014, T wave abnormality has improved Confirmed by [facility]   MD, DAVID ([id number]) on 01/16/2014 10:28:40 PM      MDM   Final diagnoses:  Acute exacerbation of CHF (congestive heart failure)    63 yo F h/o CHF pw s/s volume overload. Mild. Speaks in full sentences. Not hypoxic. Similar to prior episodes. BNP elevated, but much improved from prior.  Given lasix 80mg  IV. Has close pcp f/u <48 hours.  Patient remains well appearing. NAD. Do not believe patient requires inpt mgmt at this time as she has close follow up, no O2 requirement, no increased WOB.  Patient discharged home. Return precautions given. To follow up with pcp. patient in agreement with plan. To double home lasix AM dose next 2 days.   Labs and imaging reviewed by myself and considered in medical decision making if ordered. Imaging interpreted by radiology.   Discussed case with Dr. Roxanne Mins who is in agreement with assessment and plan.    Bonnita Hollow, MD 01/17/14 [phone]

## 2014-01-17 NOTE — Discharge Instructions (Signed)
Please double your morning dose of lasix tomorrow and the next day.    Heart Failure Heart failure means your heart has trouble pumping blood. This makes it hard for your body to work well. Heart failure is usually a long-term (chronic) condition. You must take good care of yourself and follow your doctor's treatment plan. HOME CARE  Take your heart medicine as told by your doctor.  Do not stop taking medicine unless your doctor tells you to.  Do not skip any dose of medicine.  Refill your medicines before they run out.  Take other medicines only as told by your doctor or pharmacist.  Stay active if told by your doctor. The elderly and people with severe heart failure should talk with a doctor about physical activity.  Eat heart healthy foods. Choose foods that are without trans fat and are low in saturated fat, cholesterol, and salt (sodium). This includes fresh or frozen fruits and vegetables, fish, lean meats, fat-free or low-fat dairy foods, whole grains, and high-fiber foods. Lentils and dried peas and beans (legumes) are also good choices.  Limit salt if told by your doctor.  Cook in a healthy way. Roast, grill, broil, bake, poach, steam, or stir-fry foods.  Limit fluids as told by your doctor.  Weigh yourself every morning. Do this after you pee (urinate) and before you eat breakfast. Write down your weight to give to your doctor.  Take your blood pressure and write it down if your doctor tell you to.  Ask your doctor how to check your pulse. Check your pulse as told.  Lose weight if told by your doctor.  Stop smoking or chewing tobacco. Do not use gum or patches that help you quit without your doctor's approval.  Schedule and go to doctor visits as told.  Nonpregnant women should have no more than 1 drink a day. Men should have no more than 2 drinks a day. Talk to your doctor about drinking alcohol.  Stop illegal drug use.  Stay current with shots  (immunizations).  Manage your health conditions as told by your doctor.  Learn to manage your stress.  Rest when you are tired.  If it is really hot outside:  Avoid intense activities.  Use air conditioning or fans, or get in a cooler place.  Avoid caffeine and alcohol.  Wear loose-fitting, lightweight, and light-colored clothing.  If it is really cold outside:  Avoid intense activities.  Layer your clothing.  Wear mittens or gloves, a hat, and a scarf when going outside.  Avoid alcohol.  Learn about heart failure and get support as needed.  Get help to maintain or improve your quality of life and your ability to care for yourself as needed. GET HELP IF:   You gain 03 lb/1.4 kg or more in 1 day or 05 lb/2.3 kg in a week.  You are more short of breath than usual.  You cannot do your normal activities.  You tire easily.  You cough more than normal, especially with activity.  You have any or more puffiness (swelling) in areas such as your hands, feet, ankles, or belly (abdomen).  You cannot sleep because it is hard to breathe.  You feel like your heart is beating fast (palpitations).  You get dizzy or lightheaded when you stand up. GET HELP RIGHT AWAY IF:   You have trouble breathing.  There is a change in mental status, such as becoming less alert or not being able to focus.  You  have chest pain or discomfort.  You faint. MAKE SURE YOU:   Understand these instructions.  Will watch your condition.  Will get help right away if you are not doing well or get worse. Document Released: 04/21/2008 Document Revised: 11/07/2012 Document Reviewed: 02/11/2012 [facility] Patient Information 2015 [place], [place]. This information is not intended to replace advice given to you by your health care provider. Make sure you discuss any questions you have with your health care provider.

## 2014-01-17 NOTE — ED Provider Notes (Signed)
63 year old female with history of congestive heart failure had recently been discharged following exacerbation of same. She states that she has been compliant with her medications but has noted some increasing swelling and difficulty breathing. On exam, there is no neck vein distention. Lungs have fine bibasilar rales. There is 2+ presacral edema although very little in the way of pretibial and pedal edema. Heart has regular rate and rhythm. BP is actually down compared with baseline but chest x-ray does show increasing edema. She is actually resting comfortably with good oxygen saturation and do not feel she needs to be admitted to the hospital at this time. She's given a dose of intravenous Lasix and is discharged with instructions to see her PCP in 36 hours. She is to double or morning dose of furosemide until then and return to the ED if symptoms are worsening.  I saw and evaluated the patient, reviewed the resident's note and I agree with the findings and plan.   EKG Interpretation   Date/Time:  Tuesday January 16 2014 19:58:06 EDT Ventricular Rate:  90 PR Interval:  181 QRS Duration: 87 QT Interval:  379 QTC Calculation: 464 R Axis:   53 Text Interpretation:  Sinus rhythm Anteroseptal infarct, old Nonspecific T  abnormalities, lateral leads Baseline wander in lead(s) V4 When compared  with ECG of 01/11/2014, T wave abnormality has improved Confirmed by [facility]   MD, DAVID ([id number]) on 01/16/2014 10:28:40 PM        Delora Fuel, [place] [id number]

## 2014-01-30 ENCOUNTER — Encounter: Payer: Self-pay | Admitting: Neurology

## 2014-01-30 ENCOUNTER — Encounter: Payer: BC Managed Care – PPO | Admitting: Diagnostic Neuroimaging

## 2014-01-30 ENCOUNTER — Ambulatory Visit (INDEPENDENT_AMBULATORY_CARE_PROVIDER_SITE_OTHER): Payer: BC Managed Care – PPO | Admitting: Neurology

## 2014-01-30 ENCOUNTER — Encounter: Payer: Self-pay | Admitting: Diagnostic Neuroimaging

## 2014-01-30 VITALS — BP 137/63 | HR 80 | Temp 98.0°F | Ht 65.5 in | Wt 150.0 lb

## 2014-01-30 DIAGNOSIS — I5030 Unspecified diastolic (congestive) heart failure: Secondary | ICD-10-CM

## 2014-01-30 DIAGNOSIS — G4710 Hypersomnia, unspecified: Secondary | ICD-10-CM

## 2014-01-30 DIAGNOSIS — R0609 Other forms of dyspnea: Secondary | ICD-10-CM

## 2014-01-30 DIAGNOSIS — R0683 Snoring: Secondary | ICD-10-CM

## 2014-01-30 DIAGNOSIS — R0989 Other specified symptoms and signs involving the circulatory and respiratory systems: Secondary | ICD-10-CM

## 2014-01-30 DIAGNOSIS — G4761 Periodic limb movement disorder: Secondary | ICD-10-CM

## 2014-01-30 DIAGNOSIS — N183 Chronic kidney disease, stage 3 unspecified: Secondary | ICD-10-CM

## 2014-01-30 DIAGNOSIS — R400 Somnolence: Secondary | ICD-10-CM

## 2014-01-30 DIAGNOSIS — I509 Heart failure, unspecified: Secondary | ICD-10-CM

## 2014-01-30 DIAGNOSIS — I4891 Unspecified atrial fibrillation: Secondary | ICD-10-CM

## 2014-01-30 DIAGNOSIS — I480 Paroxysmal atrial fibrillation: Secondary | ICD-10-CM

## 2014-01-30 DIAGNOSIS — G2581 Restless legs syndrome: Secondary | ICD-10-CM

## 2014-01-30 NOTE — Patient Instructions (Addendum)
Please remember to try to maintain good sleep hygiene, which means: Keep a regular sleep and wake schedule, try not to exercise or have a meal within 2 hours of your bedtime, try to keep your bedroom conducive for sleep, that is, cool and dark, without light distractors such as an illuminated alarm clock, and refrain from watching TV right before sleep or in the middle of the night and do not keep the TV or radio on during the night. Also, try not to use or play on electronic devices at bedtime, such as your cell phone, tablet PC or laptop. If you like to read at bedtime on an electronic device, try to dim the background light as much as possible. Do not eat in the middle of the night.   Based on your symptoms and your exam I believe you are at risk for obstructive sleep apnea or OSA, and I think we should proceed with a sleep study to determine whether you do or do not have OSA and how severe it is. If you have more than mild OSA, I want you to consider treatment with CPAP. Please remember, the risks and ramifications of moderate to severe obstructive sleep apnea or OSA are: Cardiovascular disease, including congestive heart failure, stroke, difficult to control hypertension, arrhythmias, and even type 2 diabetes has been linked to untreated OSA. Sleep apnea causes disruption of sleep and sleep deprivation in most cases, which, in turn, can cause recurrent headaches, problems with memory, mood, concentration, focus, and vigilance. Most people with untreated sleep apnea report excessive daytime sleepiness, which can affect their ability to drive. Please do not drive if you feel sleepy.  I will see you back after your sleep study to go over the test results and where to go from there. We will call you after your sleep study and to set up an appointment at the time.

## 2014-01-30 NOTE — Progress Notes (Signed)
Subjective:    Patient ID: Destiny Day is a 63 y.o. female.  HPI    Destiny Age, MD, PhD [facility] [address], [address] [place], [place] [address]  Dear Destiny Day,    I saw your patient, Destiny Day, upon your kind request in my neurologic clinic today for initial consultation of her sleep disorder, concern for underlying obstructive sleep apnea. The patient is unaccompanied today. As you know, Destiny Day is a 63 year old right-handed woman with an underlying medical history of hypertension, diastolic heart failure, osteoporosis, chronic kidney disease, hyperlipidemia, asthma, allergies, anxiety, obesity, A. fib, diabetes, history of GI bleed, who was admitted to [facility] last month for 3 days for hypertensive emergency, pulmonary edema and respiratory failure. She was placed on BiPAP in the hospital and she remembers using it and tolerating it fairly well. She complains of daytime somnolence and reports snoring, per husband, and she is not sure if there are apneas. She has had rare gasping sensations while asleep. She had morning HAs in the past.  She used to work 2nd shift, from 3-11 PM in the distant past and used to have a late bedtime as late as 2 AM. She has no set schedule and has anxiety, worse at night, especially since the last hospitalization. She tends to watch TV in bed and it tends to stay on all night, she also watches TV on the internet on her Desktop computer and she has a larger monitor which faces her bed. She also plays on the computer. She has never had a sleep study. Her wake time varies as well, between 8 AM to 11:30 AM. She has to get up to use the bathroom about 3-4 times per night. Her husband sleeps in another room. She has no pets.   She consumes 1 caffeinated beverages per day, usually in the form of soda. She has to limit her fluid intake to 2 l per day.  She endorses some RLS symptoms and has woken of from  jerking in her sleep. She dreams that she is running and legs feel tired in the AM.    Her Past Medical History Is Significant For: Past Medical History  Diagnosis Date  . Hypertension   . Diverticulitis   . Hyperlipidemia   . Asthma   . Diabetes mellitus     insulin dependent  . Arthritis     knee  . GI bleed 03/31/2013  . CHF (congestive heart failure)   . Osteoporosis   . Seasonal allergies   . Anxiety   . Abdominal bruit   . Chronic kidney disease     stage 3    Her Past Surgical History Is Significant For: Past Surgical History  Procedure Laterality Date  . Abdominal hysterectomy  1993`  . Esophagogastroduodenoscopy N/A 03/31/2013    Procedure: ESOPHAGOGASTRODUODENOSCOPY (EGD);  Surgeon: Gatha Mayer, MD;  Location: [facility] ENDOSCOPY;  Service: Endoscopy;  Laterality: N/A;    Her Family History Is Significant For: Family History  Problem Relation Day of Onset  . Colon cancer Neg Hx   . Other Mother     not sure of cause of death  . Diabetes Father   . Pancreatic cancer Maternal Grandmother     Her Social History Is Significant For: History   Social History  . Marital Status: Married    Spouse Name: Destiny Day    Number of Children: 4  . Years of Education: some collg   Occupational  History  . Retired    Social History Main Topics  . Smoking status: Former Smoker -- 0.35 packs/day for 40 years    Types: Cigarettes    Quit date: 05/10/2012  . Smokeless tobacco: Never Used  . Alcohol Use: No  . Drug Use: Yes     Comment: marijuana; quit in early 1980's  . Sexual Activity: None   Other Topics Concern  . None   Social History Narrative   Patient lives at home with spouse.   Caffeine Use: 1 cup daily    Her Allergies Are:  Allergies  Allergen Reactions  . Lisinopril Cough  . Prednisone Other (See Comments)    Excessive fluid buildup  :   Her Current Medications Are:  Outpatient Encounter Prescriptions as of 01/30/2014  Medication Sig  .  albuterol (PROVENTIL HFA;VENTOLIN HFA) 108 (90 BASE) MCG/ACT inhaler Inhale 2 puffs into the lungs every 6 (six) hours as needed for wheezing.  Marland Kitchen amiodarone (PACERONE) 200 MG tablet Take 1 tablet (200 mg total) by mouth 2 (two) times daily.  Marland Kitchen amLODipine (NORVASC) 10 MG tablet Take 1 tablet (10 mg total) by mouth daily.  Marland Kitchen aspirin 81 MG chewable tablet Chew 81 mg by mouth daily.  Marland Kitchen atorvastatin (LIPITOR) 20 MG tablet Take 20 mg by mouth daily at 6 PM.  . budesonide-formoterol (SYMBICORT) 160-4.5 MCG/ACT inhaler Inhale 1 puff into the lungs every evening.  . Calcium Carbonate-Vit D-Min (CALCIUM 600+D PLUS MINERALS) 600-400 MG-UNIT TABS Take 1 tablet by mouth daily.  . carvedilol (COREG) 25 MG tablet Take 1 tablet (25 mg total) by mouth 2 (two) times daily with a meal.  . chlorpheniramine-HYDROcodone (TUSSIONEX PENNKINETIC ER) 10-8 MG/5ML LQCR Take 5 mLs by mouth 2 (two) times daily. For cough, or pain  . escitalopram (LEXAPRO) 10 MG tablet Take 10 mg by mouth daily as needed (ANXIETY).  . Ferrous Sulfate (IRON) 325 (65 FE) MG TABS Take 1 capsule by mouth daily.  . furosemide (LASIX) 40 MG tablet Take 1.5 tablets (60 mg total) by mouth 2 (two) times daily.  . insulin aspart (NOVOLOG) 100 UNIT/ML injection Inject 0-8 Units into the skin 3 (three) times daily before meals. Sliding scale  . insulin detemir (LEVEMIR) 100 UNIT/ML injection Inject 10 Units into the skin at bedtime.  . irbesartan (AVAPRO) 150 MG tablet Take 1 tablet (150 mg total) by mouth daily.  . isosorbide-hydrALAZINE (BIDIL) 20-37.5 MG per tablet Take 2 tablets by mouth 3 (three) times daily.  Marland Kitchen loratadine (CLARITIN) 10 MG tablet Take 10 mg by mouth daily as needed for allergies.  . metolazone (ZAROXOLYN) 5 MG tablet Take 1 tablet by mouth as needed.  . mometasone (NASONEX) 50 MCG/ACT nasal spray Place 2 sprays into both nostrils daily as needed (ALLERGIES).  . ONE TOUCH ULTRA TEST test strip   . potassium chloride SA  (K-DUR,KLOR-CON) 20 MEQ tablet Take 20 mEq by mouth 2 (two) times daily.  . Vitamin D, Ergocalciferol, (DRISDOL) 50000 UNITS CAPS capsule Take 50,000 Units by mouth every 7 (seven) days. On wednesday  :  Review of Systems:  Out of a complete 14 point review of systems, all are reviewed and negative with the exception of these symptoms as listed below:   Review of Systems  Constitutional: Positive for fatigue.  HENT: Negative.   Eyes: Negative.   Respiratory: Negative.   Cardiovascular: Negative.   Gastrointestinal: Negative.   Endocrine: Negative.   Genitourinary: Negative.   Musculoskeletal: Negative.   Skin: Negative.  Allergic/Immunologic: Negative.   Neurological: Negative.   Hematological: Negative.   Psychiatric/Behavioral: Negative.     Objective:  Neurologic Exam  Physical Exam Physical Examination:   Filed Vitals:   01/30/14 1334  BP: 137/63  Pulse: 80  Temp: 98 F (36.7 C)    General Examination: The patient is a very pleasant 63 y.o. female in no acute distress. She appears well-developed and well-nourished and adequately groomed. She looks deconditioned.  HEENT: Normocephalic, atraumatic, pupils are equal, round and reactive to light and accommodation. Funduscopic exam is normal with sharp disc margins noted. Extraocular tracking is good without limitation to gaze excursion or nystagmus noted. Normal smooth pursuit is noted. Hearing is grossly intact. Tympanic membranes are clear bilaterally. Face is symmetric with normal facial animation and normal facial sensation. Speech is clear with no dysarthria noted. There is no hypophonia. There is no lip, neck/head, jaw or voice tremor. Neck is supple with full range of passive and active motion. There are no carotid bruits on auscultation. Oropharynx exam reveals: mild mouth dryness, poor dental hygiene with no teeth on the top and most missing on the bottom, and moderate airway crowding, due to large tongue and narrow  airway entry. Mallampati is class II. Tongue protrudes centrally and palate elevates symmetrically. Tonsils are small in size. Neck size is 15 inches.   Chest: Clear to auscultation without wheezing, rhonchi or crackles noted.  Heart: S1+S2+0, regular and normal without murmurs, rubs or gallops noted.   Abdomen: Soft, non-tender and non-distended with normal bowel sounds appreciated on auscultation.  Extremities: There is no pitting edema in the distal lower extremities bilaterally. Pedal pulses are intact.  Skin: Warm and dry without trophic changes noted. There are no varicose veins.  Musculoskeletal: exam reveals no obvious joint deformities, tenderness or joint swelling or erythema.   Neurologically:  Mental status: The patient is awake, alert and oriented in all 4 spheres. Her immediate and remote memory, attention, language skills and fund of knowledge are appropriate. There is no evidence of aphasia, agnosia, apraxia or anomia. Speech is clear with normal prosody and enunciation. Thought process is linear. Mood is normal and affect is normal.  Cranial nerves II - XII are as described above under HEENT exam. In addition: shoulder shrug is normal with equal shoulder height noted. Motor exam: thin bulk, strength and tone is noted. There is no drift, tremor or rebound. Romberg is negative. Reflexes are 2+ throughout. Babinski: Toes are flexor bilaterally. Fine motor skills and coordination: intact with normal finger taps, normal hand movements, normal rapid alternating patting, normal foot taps and normal foot agility.  Cerebellar testing: No dysmetria or intention tremor on finger to nose testing. Heel to shin is unremarkable bilaterally. There is no truncal or gait ataxia.  Sensory exam: intact to light touch, pinprick, vibration, temperature sense in the upper and lower extremities.  Gait, station and balance: She stands easily. No veering to one side is noted. No leaning to one side is  noted. Posture is Day-appropriate and stance is narrow based. Gait shows normal stride length and normal pace. No problems turning are noted. She turns en bloc. Tandem walk is difficult for her. Intact toe and heel stance is noted.               Assessment and Plan:   In summary, LITISHA MOHLMAN is a very pleasant 63 y.o.-year old female with a complex medical history of hypertension, diastolic heart failure, osteoporosis, chronic kidney disease, hyperlipidemia, asthma,  allergies, anxiety, obesity, A. fib, diabetes, and history of GI bleed, with a history and physical exam concerning for obstructive sleep apnea (OSA). She has a history of snoring, nocturia, poor sleep consolidation and also poor sleep hygiene. In addition, she has symptoms of RLS and possible PLMs. I talked to her about improving her sleep hygiene.   I had a long chat with the patient about my findings and the diagnosis of OSA, its prognosis and treatment options. We talked about medical treatments, surgical interventions and non-pharmacological approaches. I explained in particular the risks and ramifications of untreated moderate to severe OSA, especially with respect to developing cardiovascular disease down the Road, including congestive heart failure, difficult to treat hypertension, cardiac arrhythmias, or stroke. Even type 2 diabetes has, in part, been linked to untreated OSA. Symptoms of untreated OSA include daytime sleepiness, memory problems, mood irritability and mood disorder such as depression and anxiety, lack of energy, as well as recurrent headaches, especially morning headaches. We talked about trying to maintain a healthy lifestyle in general, as well as the importance of weight control. I encouraged the patient to eat healthy, exercise daily and keep well hydrated, to keep a scheduled bedtime and wake time routine, to not skip any meals and eat healthy snacks in between meals. I advised the patient not to drive when  feeling sleepy. I recommended the following at this time: sleep study with potential positive airway pressure titration. She may benefit from BiPAP treatment over CPAP given her medical Hx.  I explained the sleep test procedure to the patient and also outlined possible surgical and non-surgical treatment options of OSA, including the use of a custom-made dental device (which would require a referral to a specialist dentist or oral surgeon), upper airway surgical options, such as pillar implants, radiofrequency surgery, tongue base surgery, and UPPP (which would involve a referral to an ENT surgeon). Rarely, jaw surgery such as mandibular advancement may be considered.  I also explained the CPAP treatment option to the patient, who indicated that she would be willing to try CPAP or BiPAP if the need arises. I explained the importance of being compliant with PAP treatment, not only for insurance purposes but primarily to improve Her symptoms, and for the patient's long term health benefit, including to reduce Her cardiovascular risks. I answered all her questions today and the patient was in agreement. I would like to see her back after the sleep study is completed and encouraged her to call with any interim questions, concerns, problems or updates.   Thank you very much for allowing me to participate in the care of this nice patient. If I can be of any further assistance to you please do not hesitate to call me at [phone].  Sincerely,   Destiny Age, MD, PhD

## 2014-02-01 ENCOUNTER — Ambulatory Visit (INDEPENDENT_AMBULATORY_CARE_PROVIDER_SITE_OTHER): Payer: BC Managed Care – PPO | Admitting: Neurology

## 2014-02-01 DIAGNOSIS — R400 Somnolence: Secondary | ICD-10-CM

## 2014-02-01 DIAGNOSIS — N183 Chronic kidney disease, stage 3 unspecified: Secondary | ICD-10-CM

## 2014-02-01 DIAGNOSIS — G4761 Periodic limb movement disorder: Secondary | ICD-10-CM

## 2014-02-01 DIAGNOSIS — I5030 Unspecified diastolic (congestive) heart failure: Secondary | ICD-10-CM

## 2014-02-01 DIAGNOSIS — R0683 Snoring: Secondary | ICD-10-CM

## 2014-02-01 DIAGNOSIS — I480 Paroxysmal atrial fibrillation: Secondary | ICD-10-CM

## 2014-02-01 DIAGNOSIS — G4733 Obstructive sleep apnea (adult) (pediatric): Secondary | ICD-10-CM

## 2014-02-01 NOTE — Progress Notes (Signed)
This encounter was created in error - please disregard.

## 2014-02-13 ENCOUNTER — Other Ambulatory Visit: Payer: Self-pay | Admitting: Nephrology

## 2014-02-13 DIAGNOSIS — N184 Chronic kidney disease, stage 4 (severe): Secondary | ICD-10-CM

## 2014-02-16 ENCOUNTER — Telehealth: Payer: Self-pay | Admitting: Neurology

## 2014-02-16 ENCOUNTER — Ambulatory Visit
Admission: RE | Admit: 2014-02-16 | Discharge: 2014-02-16 | Disposition: A | Discharge status: Home / Self Care | Payer: BC Managed Care – PPO | Source: Ambulatory Visit | Attending: Nephrology | Admitting: Nephrology

## 2014-02-16 DIAGNOSIS — G4733 Obstructive sleep apnea (adult) (pediatric): Secondary | ICD-10-CM

## 2014-02-16 DIAGNOSIS — N184 Chronic kidney disease, stage 4 (severe): Secondary | ICD-10-CM

## 2014-02-16 DIAGNOSIS — I509 Heart failure, unspecified: Secondary | ICD-10-CM

## 2014-02-16 NOTE — Telephone Encounter (Signed)
Please call and notify the patient that the recent sleep study did confirm the diagnosis of obstructive sleep apnea and that I recommend treatment for this in the form of CPAP. This will require a repeat sleep study for proper titration and mask fitting. Please explain to patient and arrange for a CPAP titration study. I have placed an order in the chart. Thanks, Raeqwon Lux, MD, PhD [facility] ([facility])  

## 2014-02-17 NOTE — Telephone Encounter (Signed)
Pt understands dx of mild osa.  She has scheduled a repeat sleep study for CPAP titration.  A copy of this report was faxed to Dr. Einar Gip and will be mailed to the pt's home address.

## 2014-02-21 ENCOUNTER — Other Ambulatory Visit: Payer: Self-pay | Admitting: Nephrology

## 2014-02-21 DIAGNOSIS — N839 Noninflammatory disorder of ovary, fallopian tube and broad ligament, unspecified: Secondary | ICD-10-CM

## 2014-02-23 ENCOUNTER — Ambulatory Visit
Admission: RE | Admit: 2014-02-23 | Discharge: 2014-02-23 | Disposition: A | Discharge status: Home / Self Care | Payer: BC Managed Care – PPO | Source: Ambulatory Visit | Attending: Nephrology | Admitting: Nephrology

## 2014-02-23 DIAGNOSIS — N839 Noninflammatory disorder of ovary, fallopian tube and broad ligament, unspecified: Secondary | ICD-10-CM

## 2014-03-22 ENCOUNTER — Encounter (HOSPITAL_COMMUNITY): Payer: Self-pay | Admitting: Emergency Medicine

## 2014-03-22 DIAGNOSIS — Z79899 Other long term (current) drug therapy: Secondary | ICD-10-CM | POA: Insufficient documentation

## 2014-03-22 DIAGNOSIS — M810 Age-related osteoporosis without current pathological fracture: Secondary | ICD-10-CM | POA: Diagnosis not present

## 2014-03-22 DIAGNOSIS — M129 Arthropathy, unspecified: Secondary | ICD-10-CM | POA: Insufficient documentation

## 2014-03-22 DIAGNOSIS — E785 Hyperlipidemia, unspecified: Secondary | ICD-10-CM | POA: Insufficient documentation

## 2014-03-22 DIAGNOSIS — F411 Generalized anxiety disorder: Secondary | ICD-10-CM | POA: Diagnosis not present

## 2014-03-22 DIAGNOSIS — J45909 Unspecified asthma, uncomplicated: Secondary | ICD-10-CM | POA: Insufficient documentation

## 2014-03-22 DIAGNOSIS — Z87891 Personal history of nicotine dependence: Secondary | ICD-10-CM | POA: Insufficient documentation

## 2014-03-22 DIAGNOSIS — R05 Cough: Secondary | ICD-10-CM | POA: Diagnosis present

## 2014-03-22 DIAGNOSIS — Z794 Long term (current) use of insulin: Secondary | ICD-10-CM | POA: Diagnosis not present

## 2014-03-22 DIAGNOSIS — Z7982 Long term (current) use of aspirin: Secondary | ICD-10-CM | POA: Diagnosis not present

## 2014-03-22 DIAGNOSIS — J309 Allergic rhinitis, unspecified: Secondary | ICD-10-CM | POA: Diagnosis not present

## 2014-03-22 DIAGNOSIS — R079 Chest pain, unspecified: Secondary | ICD-10-CM | POA: Diagnosis not present

## 2014-03-22 DIAGNOSIS — N183 Chronic kidney disease, stage 3 unspecified: Secondary | ICD-10-CM | POA: Insufficient documentation

## 2014-03-22 DIAGNOSIS — E119 Type 2 diabetes mellitus without complications: Secondary | ICD-10-CM | POA: Diagnosis not present

## 2014-03-22 DIAGNOSIS — Z8719 Personal history of other diseases of the digestive system: Secondary | ICD-10-CM | POA: Diagnosis not present

## 2014-03-22 DIAGNOSIS — I509 Heart failure, unspecified: Secondary | ICD-10-CM | POA: Diagnosis not present

## 2014-03-22 DIAGNOSIS — I129 Hypertensive chronic kidney disease with stage 1 through stage 4 chronic kidney disease, or unspecified chronic kidney disease: Secondary | ICD-10-CM | POA: Diagnosis not present

## 2014-03-22 DIAGNOSIS — Z90710 Acquired absence of both cervix and uterus: Secondary | ICD-10-CM | POA: Insufficient documentation

## 2014-03-22 DIAGNOSIS — R059 Cough, unspecified: Secondary | ICD-10-CM | POA: Diagnosis present

## 2014-03-22 NOTE — ED Notes (Signed)
Presents with cough and left sided shoulder pain began this evening while lying in bed. States, "when I began coughing usually I am having a CHF exacerbation" bilateral breath sounds clear, unlabored, denies SOB, denies pedal edema or swellling.

## 2014-03-23 ENCOUNTER — Emergency Department (HOSPITAL_COMMUNITY): Payer: BC Managed Care – PPO

## 2014-03-23 ENCOUNTER — Emergency Department (HOSPITAL_COMMUNITY)
Admission: EM | Admit: 2014-03-23 | Discharge: 2014-03-23 | Disposition: A | Discharge status: Home / Self Care | Payer: BC Managed Care – PPO | Attending: Emergency Medicine | Admitting: Emergency Medicine

## 2014-03-23 DIAGNOSIS — R05 Cough: Secondary | ICD-10-CM

## 2014-03-23 DIAGNOSIS — R058 Other specified cough: Secondary | ICD-10-CM

## 2014-03-23 LAB — BASIC METABOLIC PANEL
Anion gap: 14 (ref 5–15)
BUN: 48 mg/dL — ABNORMAL HIGH (ref 6–23)
CO2: 29 mEq/L (ref 19–32)
Calcium: 9.3 mg/dL (ref 8.4–10.5)
Chloride: 93 mEq/L — ABNORMAL LOW (ref 96–112)
Creatinine, Ser: 2.48 mg/dL — ABNORMAL HIGH (ref 0.50–1.10)
GFR calc Af Amer: 23 mL/min — ABNORMAL LOW (ref >90)
GFR calc non Af Amer: 20 mL/min — ABNORMAL LOW (ref >90)
Glucose, Bld: 209 mg/dL — ABNORMAL HIGH (ref 70–99)
Potassium: 2.8 mEq/L — CL (ref 3.7–5.3)
Sodium: 136 mEq/L — ABNORMAL LOW (ref 137–147)

## 2014-03-23 LAB — CBC
HCT: 32.9 % — ABNORMAL LOW (ref 36.0–46.0)
Hemoglobin: 11.1 g/dL — ABNORMAL LOW (ref 12.0–15.0)
MCH: 28.2 pg (ref 26.0–34.0)
MCHC: 33.7 g/dL (ref 30.0–36.0)
MCV: 83.7 fL (ref 78.0–100.0)
Platelets: 351 10*3/uL (ref 150–400)
RBC: 3.93 MIL/uL (ref 3.87–5.11)
RDW: 13.2 % (ref 11.5–15.5)
WBC: 7.1 10*3/uL (ref 4.0–10.5)

## 2014-03-23 LAB — PRO B NATRIURETIC PEPTIDE: Pro B Natriuretic peptide (BNP): 3101 pg/mL — ABNORMAL HIGH (ref 0–125)

## 2014-03-23 LAB — I-STAT TROPONIN, ED: Troponin i, poc: 0.03 ng/mL (ref 0.00–0.08)

## 2014-03-23 MED ORDER — ACETAMINOPHEN-CODEINE #3 300-30 MG PO TABS: 1.0000 | ORAL_TABLET | Freq: Three times a day (TID) | ORAL | Status: DC | PRN

## 2014-03-23 MED ORDER — POTASSIUM CHLORIDE CRYS ER 20 MEQ PO TBCR
40.0000 meq | EXTENDED_RELEASE_TABLET | Freq: Once | ORAL | Status: AC
  Administered 2014-03-23: 40 meq via ORAL
  Filled 2014-03-23: qty 2

## 2014-03-23 NOTE — ED Provider Notes (Signed)
CSN: [id number]     Arrival date & time 03/22/14  2300 History   First MD Initiated Contact with Patient 03/23/14 0200     Chief Complaint  Patient presents with  . Cough  . Chest Pain     (Consider location/radiation/quality/duration/timing/severity/associated sxs/prior Treatment) HPI  [name] is a 63 year old female with a past medical history of congestive heart failure presents emergency Department with cough. Patient states he always has a chronic cough however her husband was concerned because he thought it was worse this evening. Her cough is always productive of clear sputum. She denies any CHF symptoms worsening. She states on the same number of pillows, which is the same dyspnea on exertion, and she's never had swelling in her bilateral lower extremities. Patient is compliant with her Lasix medication. She states that she is concerned she is having a CHF flare as her cough was worse this evening. She has no chest pain or shortness of breath, she denies fevers recent infections. She does have allergies as she normally takes Claritin or Tessalon Perles at home for her cough. These are not helping.  Past Medical History  Diagnosis Date  . Hypertension   . Diverticulitis   . Hyperlipidemia   . Asthma   . Diabetes mellitus     insulin dependent  . Arthritis     knee  . GI bleed 03/31/2013  . CHF (congestive heart failure)   . Osteoporosis   . Seasonal allergies   . Anxiety   . Abdominal bruit   . Chronic kidney disease     stage 3   Past Surgical History  Procedure Laterality Date  . Abdominal hysterectomy  1993`  . Esophagogastroduodenoscopy N/A 03/31/2013    Procedure: ESOPHAGOGASTRODUODENOSCOPY (EGD);  Surgeon: Gatha Mayer, MD;  Location: [facility] ENDOSCOPY;  Service: Endoscopy;  Laterality: N/A;   Family History  Problem Relation Age of Onset  . Colon cancer Neg Hx   . Other Mother     not sure of cause of death  . Diabetes Father   . Pancreatic cancer Maternal  Grandmother    History  Substance Use Topics  . Smoking status: Former Smoker -- 0.35 packs/day for 40 years    Types: Cigarettes    Quit date: 05/10/2012  . Smokeless tobacco: Never Used  . Alcohol Use: No   OB History   Grav Para Term Preterm Abortions TAB SAB Ect Mult Living                 Review of Systems 10 Systems reviewed and are negative for acute change except as noted in the HPI.    Allergies  Lisinopril and Prednisone  Home Medications   Prior to Admission medications   Medication Sig Start Date End Date Taking? Authorizing Provider  albuterol (PROVENTIL HFA;VENTOLIN HFA) 108 (90 BASE) MCG/ACT inhaler Inhale 2 puffs into the lungs every 6 (six) hours as needed for wheezing.   Yes Historical Provider, MD  amLODipine (NORVASC) 10 MG tablet Take 1 tablet (10 mg total) by mouth daily. 07/23/13  Yes Velvet Bathe, MD  aspirin 81 MG chewable tablet Chew 81 mg by mouth daily.   Yes Historical Provider, MD  atorvastatin (LIPITOR) 20 MG tablet Take 20 mg by mouth daily at 6 PM.   Yes Historical Provider, MD  budesonide-formoterol (SYMBICORT) 160-4.5 MCG/ACT inhaler Inhale 1 puff into the lungs every evening.   Yes Historical Provider, MD  Calcium Carbonate-Vit D-Min (CALCIUM 600+D PLUS MINERALS) 600-400 MG-UNIT TABS  Take 1 tablet by mouth daily.   Yes Historical Provider, MD  carvedilol (COREG) 25 MG tablet Take 1 tablet (25 mg total) by mouth 2 (two) times daily with a meal. 06/01/13  Yes Costin Karlyne Greenspan, MD  escitalopram (LEXAPRO) 10 MG tablet Take 10 mg by mouth daily as needed (ANXIETY).   Yes Historical Provider, MD  Ferrous Sulfate (IRON) 325 (65 FE) MG TABS Take 1 capsule by mouth daily.   Yes Historical Provider, MD  furosemide (LASIX) 40 MG tablet Take 1.5 tablets (60 mg total) by mouth 2 (two) times daily. 01/12/14  Yes Barton Dubois, MD  insulin aspart (NOVOLOG) 100 UNIT/ML injection Inject 0-8 Units into the skin 3 (three) times daily before meals. Sliding scale    Yes Historical Provider, MD  insulin detemir (LEVEMIR) 100 UNIT/ML injection Inject 10 Units into the skin at bedtime.   Yes Historical Provider, MD  irbesartan (AVAPRO) 150 MG tablet Take 1 tablet (150 mg total) by mouth daily. 01/12/14  Yes Barton Dubois, MD  isosorbide-hydrALAZINE (BIDIL) 20-37.5 MG per tablet Take 2 tablets by mouth 3 (three) times daily. 01/12/14  Yes Barton Dubois, MD  loratadine (CLARITIN) 10 MG tablet Take 10 mg by mouth daily as needed for allergies.   Yes Historical Provider, MD  metolazone (ZAROXOLYN) 5 MG tablet Take 1 tablet by mouth as needed. 01/17/14  Yes Historical Provider, MD  potassium chloride SA (K-DUR,KLOR-CON) 20 MEQ tablet Take 20 mEq by mouth 2 (two) times daily.   Yes Historical Provider, MD  Vitamin D, Ergocalciferol, (DRISDOL) 50000 UNITS CAPS capsule Take 50,000 Units by mouth every 7 (seven) days. On wednesday   Yes Historical Provider, MD  acetaminophen-codeine (TYLENOL #3) 300-30 MG per tablet Take 1 tablet by mouth every 8 (eight) hours as needed (cough). 03/23/14   Everlene Balls, MD  ONE TOUCH ULTRA TEST test strip  12/19/13   Historical Provider, MD   BP 134/61  Pulse 68  Temp(Src) 98 F (36.7 C) (Oral)  Resp 20  Ht 5\' 5"  (1.651 m)  Wt 148 lb (67.132 kg)  BMI 24.63 kg/m2  SpO2 100% Physical Exam  Nursing note and vitals reviewed. Constitutional: She is oriented to person, place, and time. She appears well-developed and well-nourished. No distress.  HENT:  Head: Normocephalic and atraumatic.  Nose: Nose normal.  Mouth/Throat: Oropharynx is clear and moist. No oropharyngeal exudate.  Eyes: Conjunctivae and EOM are normal. Pupils are equal, round, and reactive to light. No scleral icterus.  Neck: Normal range of motion. Neck supple. No JVD present. No tracheal deviation present. No thyromegaly present.  Cardiovascular: Normal rate, regular rhythm and normal heart sounds.  Exam reveals no gallop and no friction rub.   No murmur  heard. Pulmonary/Chest: Effort normal and breath sounds normal. No respiratory distress. She has no wheezes. She exhibits no tenderness.  Abdominal: Soft. Bowel sounds are normal. She exhibits no distension and no mass. There is no tenderness. There is no rebound and no guarding.  Musculoskeletal: Normal range of motion. She exhibits no edema and no tenderness.  Lymphadenopathy:    She has no cervical adenopathy.  Neurological: She is alert and oriented to person, place, and time.  Skin: Skin is warm and dry. No rash noted. No erythema. No pallor.    ED Course  Procedures (including critical care time) Labs Review Labs Reviewed  CBC - Abnormal; Notable for the following:    Hemoglobin 11.1 (*)    HCT 32.9 (*)  All other components within normal limits  PRO B NATRIURETIC PEPTIDE - Abnormal; Notable for the following:    Pro B Natriuretic peptide (BNP) 3101.0 (*)    All other components within normal limits  BASIC METABOLIC PANEL - Abnormal; Notable for the following:    Sodium 136 (*)    Potassium 2.8 (*)    Chloride 93 (*)    Glucose, Bld 209 (*)    BUN 48 (*)    Creatinine, Ser 2.48 (*)    GFR calc non Af Amer 20 (*)    GFR calc Af Amer 23 (*)    All other components within normal limits  Randolm Idol, ED    Imaging Review Dg Chest 2 View  03/23/2014   CLINICAL DATA:  Productive cough and chest pain.  EXAM: CHEST  2 VIEW  COMPARISON:  01/16/2014  FINDINGS: Mild hyperinflation, likely emphysema. The heart size and mediastinal contours are within normal limits. Both lungs are clear. The visualized skeletal structures are unremarkable. Tortuous and calcified aorta. Degenerative changes in the spine and shoulders.  IMPRESSION: No active cardiopulmonary disease.   Electronically Signed   By: Lucienne Capers M.D.   On: 03/23/2014 00:21     EKG Interpretation None    MUSE not working - NSR, TWI lead I aVL, prolonged QT, unchanged from previous  MDM   Final diagnoses:   Cough variant not due to asthma    History presents to the emergency department for evaluation of cough. This is infectious in etiology and she denies any fevers or change in her sputum. This could be attributed to her allergies. However she states the Claritin is not working. This could be a cardiac cause as well related to her heart failure. He has been compliant with her Lasix, she does not feel short of breath nor does she appear fluid overloaded on exam. Patient's potassium was replaced in the emergency department. She'll be sent home with Tylenol 3 with codeine to help terminate her cough. She was advised to follow with her regular doctor within 3 days for continued care. Patients vital signs remain within normal limits for her, she is safe for discharge.    Everlene Balls, MD 03/23/14 [phone]

## 2014-03-23 NOTE — Discharge Instructions (Signed)
Cough, Adult Destiny Day, you were seen today for a cough.  Your lab values are at their normal baseline.  We replaced your potassium level.  You can take tylenol #3 at home to help with your cough.  Follow up with your regular doctor within one week for continued care.  If your symptoms worsen, return to the ED for repeat evaluation.  Thank you.  A cough is a reflex. It helps you clear your throat and airways. A cough can help heal your body. A cough can last 2 or 3 weeks (acute) or may last more than 8 weeks (chronic). Some common causes of a cough can include an infection, allergy, or a cold. HOME CARE  Only take medicine as told by your doctor.  If given, take your medicines (antibiotics) as told. Finish them even if you start to feel better.  Use a cold steam vaporizer or humidifier in your home. This can help loosen thick spit (secretions).  Sleep so you are almost sitting up (semi-upright). Use pillows to do this. This helps reduce coughing.  Rest as needed.  Stop smoking if you smoke. GET HELP RIGHT AWAY IF:  You have yellowish-white fluid (pus) in your thick spit.  Your cough gets worse.  Your medicine does not reduce coughing, and you are losing sleep.  You cough up blood.  You have trouble breathing.  Your pain gets worse and medicine does not help.  You have a fever. MAKE SURE YOU:   Understand these instructions.  Will watch your condition.  Will get help right away if you are not doing well or get worse. Document Released: 03/26/2011 Document Revised: 11/27/2013 Document Reviewed: 03/26/2011 [facility] Patient Information 2015 [place], [place]. This information is not intended to replace advice given to you by your health care provider. Make sure you discuss any questions you have with your health care provider.

## 2014-03-30 ENCOUNTER — Ambulatory Visit (INDEPENDENT_AMBULATORY_CARE_PROVIDER_SITE_OTHER): Payer: BC Managed Care – PPO | Admitting: Neurology

## 2014-03-30 DIAGNOSIS — I509 Heart failure, unspecified: Secondary | ICD-10-CM

## 2014-03-30 DIAGNOSIS — G4761 Periodic limb movement disorder: Secondary | ICD-10-CM

## 2014-03-30 DIAGNOSIS — G4733 Obstructive sleep apnea (adult) (pediatric): Secondary | ICD-10-CM

## 2014-04-13 ENCOUNTER — Telehealth: Payer: Self-pay | Admitting: Neurology

## 2014-04-13 DIAGNOSIS — G4733 Obstructive sleep apnea (adult) (pediatric): Secondary | ICD-10-CM

## 2014-04-13 NOTE — Telephone Encounter (Signed)
Please call and inform patient that I have entered an order for treatment with PAP. She did well during the latest sleep study with CPAP. We will, therefore, arrange for a machine for home use through a DME (durable medical equipment) company of Her choice; and I will see the patient back in follow-up in about 6 weeks. Please also explain to the patient that I will be looking out for compliance data downloaded from the machine, which can be done remotely through a modem at times or stored on an SD card in the back of the machine. At the time of the followup appointment we will discuss sleep study results and how it is going with PAP treatment at home. Please advise patient to bring Her machine at the time of the visit; at least for the first visit, even though this is cumbersome. Bringing the machine for every visit after that may not be needed, but often helps for the first visit. Please also make sure, the patient has a follow-up appointment with me in about 6 weeks from the setup date, thanks.   Kymorah Korf, MD, PhD [facility] ([facility]  

## 2014-04-16 ENCOUNTER — Encounter: Payer: Self-pay | Admitting: Neurology

## 2014-04-17 NOTE — Telephone Encounter (Signed)
Spoke with patient and provided her the results of her CPAP titration report.  Patient was instructed that she would be set up with a local DME, [facility].  Patient was informed that we would provide her a copy of the report and that a copy would be faxed to the referring provider, Dr. Einar Gip.  Patient was in agreement with starting CPAP therapy and informed our office would be in touch with her in regards to scheduling a follow up appointment.

## 2014-05-01 ENCOUNTER — Emergency Department (HOSPITAL_COMMUNITY)
Admission: EM | Admit: 2014-05-01 | Discharge: 2014-05-01 | Disposition: A | Discharge status: Home / Self Care | Payer: BC Managed Care – PPO | Attending: Emergency Medicine | Admitting: Emergency Medicine

## 2014-05-01 ENCOUNTER — Encounter (HOSPITAL_COMMUNITY): Payer: Self-pay | Admitting: Emergency Medicine

## 2014-05-01 DIAGNOSIS — Z8719 Personal history of other diseases of the digestive system: Secondary | ICD-10-CM | POA: Diagnosis not present

## 2014-05-01 DIAGNOSIS — I509 Heart failure, unspecified: Secondary | ICD-10-CM | POA: Diagnosis not present

## 2014-05-01 DIAGNOSIS — Z7982 Long term (current) use of aspirin: Secondary | ICD-10-CM | POA: Diagnosis not present

## 2014-05-01 DIAGNOSIS — J45909 Unspecified asthma, uncomplicated: Secondary | ICD-10-CM | POA: Diagnosis not present

## 2014-05-01 DIAGNOSIS — M810 Age-related osteoporosis without current pathological fracture: Secondary | ICD-10-CM | POA: Insufficient documentation

## 2014-05-01 DIAGNOSIS — M898X1 Other specified disorders of bone, shoulder: Secondary | ICD-10-CM

## 2014-05-01 DIAGNOSIS — E785 Hyperlipidemia, unspecified: Secondary | ICD-10-CM | POA: Diagnosis not present

## 2014-05-01 DIAGNOSIS — F419 Anxiety disorder, unspecified: Secondary | ICD-10-CM | POA: Insufficient documentation

## 2014-05-01 DIAGNOSIS — N183 Chronic kidney disease, stage 3 (moderate): Secondary | ICD-10-CM | POA: Diagnosis not present

## 2014-05-01 DIAGNOSIS — E119 Type 2 diabetes mellitus without complications: Secondary | ICD-10-CM | POA: Diagnosis not present

## 2014-05-01 DIAGNOSIS — I129 Hypertensive chronic kidney disease with stage 1 through stage 4 chronic kidney disease, or unspecified chronic kidney disease: Secondary | ICD-10-CM | POA: Insufficient documentation

## 2014-05-01 DIAGNOSIS — M1990 Unspecified osteoarthritis, unspecified site: Secondary | ICD-10-CM | POA: Insufficient documentation

## 2014-05-01 DIAGNOSIS — M79622 Pain in left upper arm: Secondary | ICD-10-CM | POA: Diagnosis not present

## 2014-05-01 DIAGNOSIS — Z7951 Long term (current) use of inhaled steroids: Secondary | ICD-10-CM | POA: Insufficient documentation

## 2014-05-01 DIAGNOSIS — Z87891 Personal history of nicotine dependence: Secondary | ICD-10-CM | POA: Diagnosis not present

## 2014-05-01 DIAGNOSIS — Z794 Long term (current) use of insulin: Secondary | ICD-10-CM | POA: Insufficient documentation

## 2014-05-01 DIAGNOSIS — M25512 Pain in left shoulder: Secondary | ICD-10-CM | POA: Insufficient documentation

## 2014-05-01 DIAGNOSIS — Z79899 Other long term (current) drug therapy: Secondary | ICD-10-CM | POA: Insufficient documentation

## 2014-05-01 DIAGNOSIS — M79602 Pain in left arm: Secondary | ICD-10-CM

## 2014-05-01 DIAGNOSIS — M549 Dorsalgia, unspecified: Secondary | ICD-10-CM | POA: Diagnosis present

## 2014-05-01 LAB — CBC
HCT: 31.8 % — ABNORMAL LOW (ref 36.0–46.0)
Hemoglobin: 10.4 g/dL — ABNORMAL LOW (ref 12.0–15.0)
MCH: 27.7 pg (ref 26.0–34.0)
MCHC: 32.7 g/dL (ref 30.0–36.0)
MCV: 84.6 fL (ref 78.0–100.0)
Platelets: 359 10*3/uL (ref 150–400)
RBC: 3.76 MIL/uL — ABNORMAL LOW (ref 3.87–5.11)
RDW: 13.6 % (ref 11.5–15.5)
WBC: 6.9 10*3/uL (ref 4.0–10.5)

## 2014-05-01 LAB — BASIC METABOLIC PANEL
Anion gap: 12 (ref 5–15)
BUN: 44 mg/dL — ABNORMAL HIGH (ref 6–23)
CO2: 32 mEq/L (ref 19–32)
Calcium: 9.3 mg/dL (ref 8.4–10.5)
Chloride: 93 mEq/L — ABNORMAL LOW (ref 96–112)
Creatinine, Ser: 2.89 mg/dL — ABNORMAL HIGH (ref 0.50–1.10)
GFR calc Af Amer: 19 mL/min — ABNORMAL LOW (ref >90)
GFR calc non Af Amer: 16 mL/min — ABNORMAL LOW (ref >90)
Glucose, Bld: 131 mg/dL — ABNORMAL HIGH (ref 70–99)
Potassium: 3.2 mEq/L — ABNORMAL LOW (ref 3.7–5.3)
Sodium: 137 mEq/L (ref 137–147)

## 2014-05-01 LAB — I-STAT TROPONIN, ED: Troponin i, poc: 0.03 ng/mL (ref 0.00–0.08)

## 2014-05-01 MED ORDER — HYDROMORPHONE HCL 1 MG/ML IJ SOLN
1.0000 mg | Freq: Once | INTRAMUSCULAR | Status: AC
  Administered 2014-05-01: 1 mg via INTRAMUSCULAR
  Filled 2014-05-01: qty 1

## 2014-05-01 MED ORDER — OXYCODONE-ACETAMINOPHEN 5-325 MG PO TABS
1.0000 | ORAL_TABLET | Freq: Four times a day (QID) | ORAL | Status: DC | PRN
Start: 2014-05-01 — End: 2014-10-29

## 2014-05-01 MED ORDER — ONDANSETRON 4 MG PO TBDP: ORAL_TABLET | ORAL | Status: DC

## 2014-05-01 MED ORDER — ONDANSETRON 4 MG PO TBDP
4.0000 mg | ORAL_TABLET | Freq: Once | ORAL | Status: AC
  Administered 2014-05-01: 4 mg via ORAL
  Filled 2014-05-01: qty 1

## 2014-05-01 NOTE — ED Notes (Signed)
resentw with over one week of left sided back and shoulder pain radiates into neck and arm pain rated 10/10. Pain is positional and worse with movement. Given tramadol by Primary doctor and that is not helping.

## 2014-05-01 NOTE — ED Notes (Signed)
Explained to pt the laws and dangers of driving under influence after received narcotic pain medication.

## 2014-05-01 NOTE — ED Provider Notes (Signed)
CSN: [id number]     Arrival date & time 05/01/14  1614 History   First MD Initiated Contact with Patient 05/01/14 2005     Chief Complaint  Patient presents with  . Back Pain     (Consider location/radiation/quality/duration/timing/severity/associated sxs/prior Treatment) Patient is a 63 y.o. female presenting with back pain. The history is provided by the patient.  Back Pain Pain location: Left scapula. Quality: sharp. Radiates to: Radiates to left lateral neck and left arm. Pain severity:  Moderate Pain is:  Same all the time Onset quality:  Gradual Duration: 2-3 weeks. Timing:  Constant Progression:  Unchanged Chronicity:  New Context comment:  After washing a car. Relieved by:  Nothing Worsened by:  Nothing tried Ineffective treatments: Tramadol and Robaxin. Associated symptoms: no abdominal pain, no chest pain, no dysuria, no fever and no headaches     Past Medical History  Diagnosis Date  . Hypertension   . Diverticulitis   . Hyperlipidemia   . Asthma   . Diabetes mellitus     insulin dependent  . Arthritis     knee  . GI bleed 03/31/2013  . CHF (congestive heart failure)   . Osteoporosis   . Seasonal allergies   . Anxiety   . Abdominal bruit   . Chronic kidney disease     stage 3   Past Surgical History  Procedure Laterality Date  . Abdominal hysterectomy  1993`  . Esophagogastroduodenoscopy N/A 03/31/2013    Procedure: ESOPHAGOGASTRODUODENOSCOPY (EGD);  Surgeon: Gatha Mayer, MD;  Location: [facility] ENDOSCOPY;  Service: Endoscopy;  Laterality: N/A;   Family History  Problem Relation Age of Onset  . Colon cancer Neg Hx   . Other Mother     not sure of cause of death  . Diabetes Father   . Pancreatic cancer Maternal Grandmother    History  Substance Use Topics  . Smoking status: Former Smoker -- 0.35 packs/day for 40 years    Types: Cigarettes    Quit date: 05/10/2012  . Smokeless tobacco: Never Used  . Alcohol Use: No   OB History   Grav Para  Term Preterm Abortions TAB SAB Ect Mult Living                 Review of Systems  Constitutional: Negative for fever and fatigue.  HENT: Negative for congestion and drooling.   Eyes: Negative for pain.  Respiratory: Negative for cough and shortness of breath.   Cardiovascular: Negative for chest pain.  Gastrointestinal: Negative for nausea, vomiting, abdominal pain and diarrhea.  Genitourinary: Negative for dysuria and hematuria.  Musculoskeletal: Negative for back pain, gait problem and neck pain.  Skin: Negative for color change.  Neurological: Negative for dizziness and headaches.  Hematological: Negative for adenopathy.  Psychiatric/Behavioral: Negative for behavioral problems.  All other systems reviewed and are negative.     Allergies  Lisinopril and Prednisone  Home Medications   Prior to Admission medications   Medication Sig Start Date End Date Taking? Authorizing Provider  acetaminophen-codeine (TYLENOL #3) 300-30 MG per tablet Take 1 tablet by mouth every 8 (eight) hours as needed (cough). 03/23/14   Everlene Balls, MD  albuterol (PROVENTIL HFA;VENTOLIN HFA) 108 (90 BASE) MCG/ACT inhaler Inhale 2 puffs into the lungs every 6 (six) hours as needed for wheezing.    Historical Provider, MD  amLODipine (NORVASC) 10 MG tablet Take 1 tablet (10 mg total) by mouth daily. 07/23/13   Velvet Bathe, MD  aspirin 81 MG chewable tablet  Chew 81 mg by mouth daily.    Historical Provider, MD  atorvastatin (LIPITOR) 20 MG tablet Take 20 mg by mouth daily at 6 PM.    Historical Provider, MD  budesonide-formoterol (SYMBICORT) 160-4.5 MCG/ACT inhaler Inhale 1 puff into the lungs every evening.    Historical Provider, MD  Calcium Carbonate-Vit D-Min (CALCIUM 600+D PLUS MINERALS) 600-400 MG-UNIT TABS Take 1 tablet by mouth daily.    Historical Provider, MD  carvedilol (COREG) 25 MG tablet Take 1 tablet (25 mg total) by mouth 2 (two) times daily with a meal. 06/01/13   Costin Karlyne Greenspan, MD   escitalopram (LEXAPRO) 10 MG tablet Take 10 mg by mouth daily as needed (ANXIETY).    Historical Provider, MD  Ferrous Sulfate (IRON) 325 (65 FE) MG TABS Take 1 capsule by mouth daily.    Historical Provider, MD  furosemide (LASIX) 40 MG tablet Take 1.5 tablets (60 mg total) by mouth 2 (two) times daily. 01/12/14   Barton Dubois, MD  insulin aspart (NOVOLOG) 100 UNIT/ML injection Inject 0-8 Units into the skin 3 (three) times daily before meals. Sliding scale    Historical Provider, MD  insulin detemir (LEVEMIR) 100 UNIT/ML injection Inject 10 Units into the skin at bedtime.    Historical Provider, MD  irbesartan (AVAPRO) 150 MG tablet Take 1 tablet (150 mg total) by mouth daily. 01/12/14   Barton Dubois, MD  isosorbide-hydrALAZINE (BIDIL) 20-37.5 MG per tablet Take 2 tablets by mouth 3 (three) times daily. 01/12/14   Barton Dubois, MD  loratadine (CLARITIN) 10 MG tablet Take 10 mg by mouth daily as needed for allergies.    Historical Provider, MD  metolazone (ZAROXOLYN) 5 MG tablet Take 1 tablet by mouth as needed. 01/17/14   Historical Provider, MD  ONE TOUCH ULTRA TEST test strip  12/19/13   Historical Provider, MD  potassium chloride SA (K-DUR,KLOR-CON) 20 MEQ tablet Take 20 mEq by mouth 2 (two) times daily.    Historical Provider, MD  Vitamin D, Ergocalciferol, (DRISDOL) 50000 UNITS CAPS capsule Take 50,000 Units by mouth every 7 (seven) days. On wednesday    Historical Provider, MD   BP 168/92  Pulse 101  Temp(Src) 97.7 F (36.5 C) (Oral)  Resp 20  SpO2 100% Physical Exam  Nursing note and vitals reviewed. Constitutional: She is oriented to person, place, and time. She appears well-developed and well-nourished.  HENT:  Head: Normocephalic and atraumatic.  Mouth/Throat: Oropharynx is clear and moist. No oropharyngeal exudate.  Eyes: Conjunctivae and EOM are normal. Pupils are equal, round, and reactive to light.  Neck: Normal range of motion. Neck supple.  Cardiovascular: Normal rate,  regular rhythm, normal heart sounds and intact distal pulses.  Exam reveals no gallop and no friction rub.   No murmur heard. Pulmonary/Chest: Effort normal and breath sounds normal. No respiratory distress. She has no wheezes.  Abdominal: Soft. Bowel sounds are normal. There is no tenderness. There is no rebound and no guarding.  Musculoskeletal: Normal range of motion. She exhibits tenderness. She exhibits no edema.  Mild to moderate tenderness to palpation along the border of the left scapula.  Pain with range of motion of the left shoulder.  Normal strength and sensation in bilateral upper extremities.  2+ distal and proximal pulses in the upper extremities.  Neurological: She is alert and oriented to person, place, and time.  Skin: Skin is warm and dry.  Psychiatric: She has a normal mood and affect. Her behavior is normal.    ED  Course  Procedures (including critical care time) Labs Review Labs Reviewed  CBC - Abnormal; Notable for the following:    RBC 3.76 (*)    Hemoglobin 10.4 (*)    HCT 31.8 (*)    All other components within normal limits  BASIC METABOLIC PANEL - Abnormal; Notable for the following:    Potassium 3.2 (*)    Chloride 93 (*)    Glucose, Bld 131 (*)    BUN 44 (*)    Creatinine, Ser 2.89 (*)    GFR calc non Af Amer 16 (*)    GFR calc Af Amer 19 (*)    All other components within normal limits  I-STAT TROPOININ, ED    Imaging Review No results found.   EKG Interpretation   Date/Time:  Tuesday May 01 2014 16:27:07 EDT Ventricular Rate:  97 PR Interval:  144 QRS Duration: 90 QT Interval:  406 QTC Calculation: 515 R Axis:   54 Text Interpretation:  Normal sinus rhythm Nonspecific T wave abnormality  Prolonged QT No significant change since last tracing Confirmed by  Kohler Pellerito  MD, Delane Stalling ([id number]) on 05/01/2014 8:14:48 PM      MDM   Final diagnoses:  Pain of left scapula  Left arm pain    8:32 PM 63 y.o. female w hx of HTN, DM, CHF,  CKD who presents with left scapular pain radiating down the left arm which began several weeks ago. She has seen her PCP who prescribed her Robaxin and tramadol. She notes that these medications make her nauseous and she is not tolerating them well. In discussion with her husband she believes that her pain began after washing her car. She denies any specific injury. She denies any chest pain or shortness of breath. Her pain is easily reproduced on exam with range of motion of the left shoulder and also palpation of the left scapula. She has a normal neurologic exam in her upper extremities and I don't think imaging would help identify the cause of her pain as it is likely msk. Will get pain control with IM dilaudid and zofran.   9:20 PM: Pt feeling better. Will start percocet and zofran.  I have discussed the diagnosis/risks/treatment options with the patient and believe the pt to be eligible for discharge home to follow-up with her pcp for likely referral to ortho. We also discussed returning to the ED immediately if new or worsening sx occur. We discussed the sx which are most concerning (e.g., worsening pain, cp, sob) that necessitate immediate return. Medications administered to the patient during their visit and any new prescriptions provided to the patient are listed below.  Medications given during this visit Medications  HYDROmorphone (DILAUDID) injection 1 mg (1 mg Intramuscular Given 05/01/14 2029)  ondansetron (ZOFRAN-ODT) disintegrating tablet 4 mg (4 mg Oral Given 05/01/14 2028)    Discharge Medication List as of 05/01/2014  9:22 PM    START taking these medications   Details  ondansetron (ZOFRAN ODT) 4 MG disintegrating tablet 4mg  ODT q4 hours prn nausea/vomit, Print    oxyCODONE-acetaminophen (PERCOCET) 5-325 MG per tablet Take 1-2 tablets by mouth every 6 (six) hours as needed for moderate pain., Starting 05/01/2014, Until Discontinued, Print         Pamella Pert, MD 05/02/14  0003

## 2014-05-11 ENCOUNTER — Ambulatory Visit (INDEPENDENT_AMBULATORY_CARE_PROVIDER_SITE_OTHER): Payer: BC Managed Care – PPO

## 2014-05-11 ENCOUNTER — Ambulatory Visit (INDEPENDENT_AMBULATORY_CARE_PROVIDER_SITE_OTHER): Payer: BC Managed Care – PPO | Admitting: Family Medicine

## 2014-05-11 VITALS — BP 126/74 | HR 92 | Temp 98.4°F | Resp 18 | Ht 64.0 in | Wt 142.0 lb

## 2014-05-11 DIAGNOSIS — M546 Pain in thoracic spine: Secondary | ICD-10-CM

## 2014-05-11 DIAGNOSIS — M25512 Pain in left shoulder: Secondary | ICD-10-CM

## 2014-05-11 MED ORDER — CYCLOBENZAPRINE HCL 10 MG PO TABS: 10.0000 mg | ORAL_TABLET | Freq: Two times a day (BID) | ORAL | Status: DC | PRN

## 2014-05-11 NOTE — Progress Notes (Addendum)
[facility] [address], [place] [address] [phone]  Date:  05/11/2014   Name:  Destiny Day   DOB:  04-Dec-1950   MRN:  [id number]  PCP:  Velna Hatchet, MD    Chief Complaint: Neck Pain and Shoulder Pain   History of Present Illness:  Destiny Day is a 63 y.o. very pleasant female patient who presents with the following:  She was in the ER on 10/6 with pain in her left scapula.  She was treated with IM dilauded and was sent home with percocet, asked to follow-up with her PCP.    She is here today as a new patient, now with complaint of pain in her left sided neck, shoulder blade, and down her left arm and into her fingers.  Prior to her visit to the ER she also saw her PCP and received muscle relaxer- robaxin.  She states this "just made me sick on my stomach."  She states that percocet is also not helping with her pain.   She is not aware of any causative factors, denies any overuse, fall, MVA or other trauma.  She has never has this problem in the past   Wt Readings from Last 3 Encounters:  05/11/14 142 lb (64.411 kg)  03/22/14 148 lb (67.132 kg)  01/30/14 150 lb (68.04 kg)    Patient Active Problem List   Diagnosis Date Noted  . Acute exacerbation of CHF (congestive heart failure) 01/10/2014  . Hypertensive heart disease 05/25/2013  . CKD (chronic kidney disease) stage 3, GFR 30-59 ml/min 05/25/2013  . Malignant hypertension 05/23/2013  . Acute respiratory failure 05/23/2013  . Hypertensive urgency 05/23/2013  . Pulmonary edema 05/23/2013  . Type 2 diabetes mellitus with diabetic nephropathy 05/23/2013  . Type 2 diabetes mellitus with hyperosmolar nonketotic hyperglycemia 04/13/2013  . Chronic diastolic CHF (congestive heart failure) 04/13/2013  . UGI bleed 03/31/2013  . Hypokalemia 08/24/2012  . Diastolic CHF, acute on chronic 08/22/2011  . HTN (hypertension), malignant 08/22/2011  . Type II or unspecified type diabetes  mellitus without mention of complication, not stated as uncontrolled 12/27/2007  . HYPERLIPIDEMIA 12/27/2007  . HYPERTENSION 12/27/2007  . ALLERGIC RHINITIS 12/27/2007  . ASTHMA 12/27/2007    Past Medical History  Diagnosis Date  . Hypertension   . Diverticulitis   . Hyperlipidemia   . Asthma   . Diabetes mellitus     insulin dependent  . Arthritis     knee  . GI bleed 03/31/2013  . CHF (congestive heart failure)   . Osteoporosis   . Seasonal allergies   . Anxiety   . Abdominal bruit   . Chronic kidney disease     stage 3    Past Surgical History  Procedure Laterality Date  . Abdominal hysterectomy  1993`  . Esophagogastroduodenoscopy N/A 03/31/2013    Procedure: ESOPHAGOGASTRODUODENOSCOPY (EGD);  Surgeon: Gatha Mayer, MD;  Location: [facility] ENDOSCOPY;  Service: Endoscopy;  Laterality: N/A;    History  Substance Use Topics  . Smoking status: Former Smoker -- 0.35 packs/day for 40 years    Types: Cigarettes    Quit date: 05/10/2012  . Smokeless tobacco: Never Used  . Alcohol Use: No    Family History  Problem Relation Age of Onset  . Colon cancer Neg Hx   . Other Mother     not sure of cause of death  . Diabetes Father   . Pancreatic cancer Maternal Grandmother  Allergies  Allergen Reactions  . Lisinopril Cough  . Prednisone Other (See Comments)    Excessive fluid buildup    Medication list has been reviewed and updated.  Current Outpatient Prescriptions on File Prior to Visit  Medication Sig Dispense Refill  . acetaminophen-codeine (TYLENOL #3) 300-30 MG per tablet Take 1 tablet by mouth every 8 (eight) hours as needed (cough).  6 tablet  0  . albuterol (PROVENTIL HFA;VENTOLIN HFA) 108 (90 BASE) MCG/ACT inhaler Inhale 2 puffs into the lungs every 6 (six) hours as needed for wheezing.      Marland Kitchen amLODipine (NORVASC) 10 MG tablet Take 1 tablet (10 mg total) by mouth daily.  30 tablet  0  . aspirin 81 MG chewable tablet Chew 81 mg by mouth daily.      Marland Kitchen  atorvastatin (LIPITOR) 20 MG tablet Take 20 mg by mouth daily at 6 PM.      . budesonide-formoterol (SYMBICORT) 160-4.5 MCG/ACT inhaler Inhale 1 puff into the lungs every evening.      . Calcium Carbonate-Vit D-Min (CALCIUM 600+D PLUS MINERALS) 600-400 MG-UNIT TABS Take 1 tablet by mouth daily.      . carvedilol (COREG) 25 MG tablet Take 1 tablet (25 mg total) by mouth 2 (two) times daily with a meal.  60 tablet  1  . escitalopram (LEXAPRO) 10 MG tablet Take 10 mg by mouth daily as needed (ANXIETY).      . Ferrous Sulfate (IRON) 325 (65 FE) MG TABS Take 1 capsule by mouth daily.      . furosemide (LASIX) 40 MG tablet Take 1.5 tablets (60 mg total) by mouth 2 (two) times daily.  90 tablet  1  . insulin aspart (NOVOLOG) 100 UNIT/ML injection Inject 0-8 Units into the skin 3 (three) times daily before meals. Sliding scale      . insulin detemir (LEVEMIR) 100 UNIT/ML injection Inject 10 Units into the skin at bedtime.      . irbesartan (AVAPRO) 150 MG tablet Take 1 tablet (150 mg total) by mouth daily.  30 tablet  1  . isosorbide-hydrALAZINE (BIDIL) 20-37.5 MG per tablet Take 2 tablets by mouth 3 (three) times daily.  180 tablet  1  . loratadine (CLARITIN) 10 MG tablet Take 10 mg by mouth daily as needed for allergies.      . metolazone (ZAROXOLYN) 5 MG tablet Take 1 tablet by mouth as needed.      . ondansetron (ZOFRAN ODT) 4 MG disintegrating tablet 4mg  ODT q4 hours prn nausea/vomit  25 tablet  0  . ONE TOUCH ULTRA TEST test strip       . oxyCODONE-acetaminophen (PERCOCET) 5-325 MG per tablet Take 1-2 tablets by mouth every 6 (six) hours as needed for moderate pain.  25 tablet  0  . potassium chloride SA (K-DUR,KLOR-CON) 20 MEQ tablet Take 20 mEq by mouth 2 (two) times daily.      . Vitamin D, Ergocalciferol, (DRISDOL) 50000 UNITS CAPS capsule Take 50,000 Units by mouth every 7 (seven) days. On wednesday       No current facility-administered medications on file prior to visit.    Review of  Systems:  As per HPI- otherwise negative.   Physical Examination: Filed Vitals:   05/11/14 1648  BP: 126/74  Pulse: 92  Temp: 98.4 F (36.9 C)  Resp: 18   Filed Vitals:   05/11/14 1648  Height: 5\' 4"  (1.626 m)  Weight: 142 lb (64.411 kg)   Body mass index is  24.36 kg/(m^2). Ideal Body Weight: Weight in (lb) to have BMI = 25: 145.3  GEN: WDWN, NAD, Non-toxic, A & O x 3 HEENT: Atraumatic, Normocephalic. Neck supple. No masses, No LAD. Ears and Nose: No external deformity. CV: RRR, No M/G/R. No JVD. No thrill. No extra heart sounds. PULM: CTA B, no wheezes, crackles, rhonchi. No retractions. No resp. distress. No accessory muscle use. ABD: S, NT, ND EXTR: No c/c/e NEURO Normal gait.  PSYCH: Normally interactive. Conversant. Not depressed or anxious appearing.  Calm demeanor.  Complaint of pain in her left thoracic back, over the left scapula, in the left shoulder and the left neck.   Pt seems to be in extreme pain with exam.   She cries and moans when I touch her skin even lightly with my stethoscope.  Shoulder has normal ROM.  No lesion or rash on the skin.  Exam limited by her resistance and apparent extreme pain.  [facility] reading (PRIMARY) by  Dr. Lorelei Pont. CXR: negative Cervical spine: degenerative change and spurring Left shoulder: negative  CLINICAL DATA: Previous smoker, left-sided thoracic pain  EXAM: CHEST 2 VIEW  COMPARISON: 03/23/2014  FINDINGS: The heart size and mediastinal contours are within normal limits. Both lungs are clear. The visualized skeletal structures are unremarkable.  IMPRESSION: No active cardiopulmonary disease.   CERVICAL SPINE 4+ VIEWS  COMPARISON: None.  FINDINGS: Slight straightening of the cervical spine.  Mild C5-6 and C6-7 disc space narrowing with minimal bony spur. Uncinate hypertrophy with mild foraminal narrowing bilaterally C3-4 and C5-6 with minimal foraminal narrowing C6-7 level bilaterally. Bilateral apical pleural  thickening without associated bony destruction. Carotid calcifications. No fracture or dislocation. No osseous destructive  IMPRESSION: Mild C5-6 and C6-7 disc space narrowing with minimal bony spur.  Uncinate hypertrophy with mild foraminal narrowing bilaterally C3-4 and C5-6 with minimal foraminal narrowing C6-7 level bilaterally.  LEFT SHOULDER - 2+ VIEW  COMPARISON: 03/23/2014 chest x-ray.  FINDINGS: Mild acromioclavicular joint degenerative changes. Os acromiale incidentally noted. No fracture or dislocation. No osseous destructive lesion Calcified tortuous aorta. Carotid bifurcation calcifications.  IMPRESSION: Mild acromioclavicular joint degenerative changes. Os acromiale incidentally noted. No fracture or dislocation. No osseous destructive lesion Calcified tortuous aorta. Carotid bifurcation calcifications.   Assessment and Plan: Pain in left shoulder - Plan: DG Cervical Spine Complete, DG Shoulder Left, DG Chest 2 View, MR Cervical Spine Wo Contrast, cyclobenzaprine (FLEXERIL) 10 MG tablet  Left-sided thoracic back pain - Plan: DG Cervical Spine Complete, DG Shoulder Left, DG Chest 2 View, MR Cervical Spine Wo Contrast, cyclobenzaprine (FLEXERIL) 10 MG tablet  Destiny Day is here today with pain in her left shoulder and thoracic back for a couple of weeks with no definite cause.  She does have degenerative change in her neck- nerve impingement may be contributing.  Will schedule an MRI of her cervical spine, and try flexeril as needed for current discomfort. Advised that this can be sedating and should not be combined with other sedating medications. She does not have any current sign of CHF, no LE edema and her CXR is reassuring. Recent EF was normal (June of this year).  Realized later possible contraindication with flexeril use in CHF patients.  Discussed with pharmD who also had not been aware of this possible concern and who did not feel it is likely significant.   Will touch base with pt regarding this tomorrow.    Signed Lamar Blinks, MD

## 2014-05-11 NOTE — Patient Instructions (Signed)
I am going to request and MRI of your cervical spine (neck) to try and find out if a pinched nerve is causing your pain.  In the meantime you can try the flexeril as needed- this is a stronger muscle relaxer than you had before.  Be careful of sedation with this medication.

## 2014-05-12 ENCOUNTER — Telehealth: Payer: Self-pay | Admitting: Family Medicine

## 2014-05-12 NOTE — Telephone Encounter (Signed)
Called and spoke with her.  She is feeling better.  The flexeril did seem to help.  Let her know that there is a listed contraindication for CHF pts with flexeril.  However the pharmD did not feel there is likely a significant risk as she is currently asymptomatic from a CHF standpoint. She will watch her sx and DC if any sign of CHF exacerbation.

## 2014-05-14 ENCOUNTER — Telehealth: Payer: Self-pay

## 2014-05-14 NOTE — Telephone Encounter (Signed)
Please call [facility] to call and give more clinical notes for an mri c spine scan

## 2014-05-14 NOTE — Telephone Encounter (Signed)
Prior Auth # in referral

## 2014-05-15 ENCOUNTER — Emergency Department (HOSPITAL_COMMUNITY): Payer: BC Managed Care – PPO

## 2014-05-15 ENCOUNTER — Encounter (HOSPITAL_COMMUNITY): Payer: Self-pay | Admitting: Emergency Medicine

## 2014-05-15 ENCOUNTER — Emergency Department (HOSPITAL_COMMUNITY)
Admission: EM | Admit: 2014-05-15 | Discharge: 2014-05-16 | Disposition: A | Discharge status: Home / Self Care | Payer: BC Managed Care – PPO | Attending: Emergency Medicine | Admitting: Emergency Medicine

## 2014-05-15 DIAGNOSIS — Z79899 Other long term (current) drug therapy: Secondary | ICD-10-CM | POA: Insufficient documentation

## 2014-05-15 DIAGNOSIS — M179 Osteoarthritis of knee, unspecified: Secondary | ICD-10-CM | POA: Diagnosis not present

## 2014-05-15 DIAGNOSIS — M25512 Pain in left shoulder: Secondary | ICD-10-CM | POA: Diagnosis present

## 2014-05-15 DIAGNOSIS — I129 Hypertensive chronic kidney disease with stage 1 through stage 4 chronic kidney disease, or unspecified chronic kidney disease: Secondary | ICD-10-CM | POA: Diagnosis not present

## 2014-05-15 DIAGNOSIS — J45909 Unspecified asthma, uncomplicated: Secondary | ICD-10-CM | POA: Diagnosis not present

## 2014-05-15 DIAGNOSIS — Z7982 Long term (current) use of aspirin: Secondary | ICD-10-CM | POA: Diagnosis not present

## 2014-05-15 DIAGNOSIS — Z8719 Personal history of other diseases of the digestive system: Secondary | ICD-10-CM | POA: Diagnosis not present

## 2014-05-15 DIAGNOSIS — X58XXXA Exposure to other specified factors, initial encounter: Secondary | ICD-10-CM | POA: Diagnosis not present

## 2014-05-15 DIAGNOSIS — Y929 Unspecified place or not applicable: Secondary | ICD-10-CM | POA: Insufficient documentation

## 2014-05-15 DIAGNOSIS — I509 Heart failure, unspecified: Secondary | ICD-10-CM | POA: Insufficient documentation

## 2014-05-15 DIAGNOSIS — IMO0001 Reserved for inherently not codable concepts without codable children: Secondary | ICD-10-CM

## 2014-05-15 DIAGNOSIS — F419 Anxiety disorder, unspecified: Secondary | ICD-10-CM | POA: Diagnosis not present

## 2014-05-15 DIAGNOSIS — E119 Type 2 diabetes mellitus without complications: Secondary | ICD-10-CM | POA: Diagnosis not present

## 2014-05-15 DIAGNOSIS — S43492A Other sprain of left shoulder joint, initial encounter: Secondary | ICD-10-CM | POA: Diagnosis not present

## 2014-05-15 DIAGNOSIS — Y939 Activity, unspecified: Secondary | ICD-10-CM | POA: Insufficient documentation

## 2014-05-15 DIAGNOSIS — S46812A Strain of other muscles, fascia and tendons at shoulder and upper arm level, left arm, initial encounter: Secondary | ICD-10-CM

## 2014-05-15 DIAGNOSIS — E785 Hyperlipidemia, unspecified: Secondary | ICD-10-CM | POA: Diagnosis not present

## 2014-05-15 DIAGNOSIS — Z794 Long term (current) use of insulin: Secondary | ICD-10-CM | POA: Insufficient documentation

## 2014-05-15 DIAGNOSIS — N183 Chronic kidney disease, stage 3 (moderate): Secondary | ICD-10-CM | POA: Diagnosis not present

## 2014-05-15 DIAGNOSIS — Z87891 Personal history of nicotine dependence: Secondary | ICD-10-CM | POA: Insufficient documentation

## 2014-05-15 DIAGNOSIS — M810 Age-related osteoporosis without current pathological fracture: Secondary | ICD-10-CM | POA: Insufficient documentation

## 2014-05-15 LAB — BASIC METABOLIC PANEL
Anion gap: 14 (ref 5–15)
BUN: 38 mg/dL — ABNORMAL HIGH (ref 6–23)
CO2: 29 mEq/L (ref 19–32)
Calcium: 9.8 mg/dL (ref 8.4–10.5)
Chloride: 91 mEq/L — ABNORMAL LOW (ref 96–112)
Creatinine, Ser: 2.58 mg/dL — ABNORMAL HIGH (ref 0.50–1.10)
GFR calc Af Amer: 22 mL/min — ABNORMAL LOW (ref >90)
GFR calc non Af Amer: 19 mL/min — ABNORMAL LOW (ref >90)
Glucose, Bld: 273 mg/dL — ABNORMAL HIGH (ref 70–99)
Potassium: 3.3 mEq/L — ABNORMAL LOW (ref 3.7–5.3)
Sodium: 134 mEq/L — ABNORMAL LOW (ref 137–147)

## 2014-05-15 LAB — CBC
HCT: 33 % — ABNORMAL LOW (ref 36.0–46.0)
Hemoglobin: 11.2 g/dL — ABNORMAL LOW (ref 12.0–15.0)
MCH: 27.9 pg (ref 26.0–34.0)
MCHC: 33.9 g/dL (ref 30.0–36.0)
MCV: 82.3 fL (ref 78.0–100.0)
Platelets: 352 10*3/uL (ref 150–400)
RBC: 4.01 MIL/uL (ref 3.87–5.11)
RDW: 13.4 % (ref 11.5–15.5)
WBC: 8.1 10*3/uL (ref 4.0–10.5)

## 2014-05-15 LAB — TROPONIN I: Troponin I: <0.3 ng/mL (ref <0.30)

## 2014-05-15 MED ORDER — HYDRALAZINE HCL 20 MG/ML IJ SOLN
10.0000 mg | INTRAMUSCULAR | Status: AC
  Administered 2014-05-15: 10 mg via INTRAVENOUS
  Filled 2014-05-15: qty 1

## 2014-05-15 MED ORDER — HYDROMORPHONE HCL 1 MG/ML IJ SOLN
1.0000 mg | Freq: Once | INTRAMUSCULAR | Status: AC
  Administered 2014-05-15: 1 mg via INTRAVENOUS
  Filled 2014-05-15: qty 1

## 2014-05-15 MED ORDER — DIAZEPAM 5 MG PO TABS: 5.0000 mg | ORAL_TABLET | Freq: Three times a day (TID) | ORAL | Status: DC | PRN

## 2014-05-15 MED ORDER — DIAZEPAM 5 MG/ML IJ SOLN
5.0000 mg | Freq: Once | INTRAMUSCULAR | Status: AC
  Administered 2014-05-15: 5 mg via INTRAVENOUS
  Filled 2014-05-15: qty 2

## 2014-05-15 MED ORDER — OXYCODONE-ACETAMINOPHEN 5-325 MG PO TABS
1.0000 | ORAL_TABLET | Freq: Once | ORAL | Status: AC
  Administered 2014-05-15: 1 via ORAL
  Filled 2014-05-15: qty 1

## 2014-05-15 MED ORDER — OXYCODONE-ACETAMINOPHEN 5-325 MG PO TABS
1.0000 | ORAL_TABLET | ORAL | Status: DC | PRN
Start: 2014-05-15 — End: 2014-08-01

## 2014-05-15 MED ORDER — ONDANSETRON 4 MG PO TBDP: ORAL_TABLET | ORAL | Status: DC

## 2014-05-15 NOTE — ED Notes (Signed)
Pt in c/o left shoulder pain and arm pain, states this has been going on since 10/4, has been seen for same, pt states symptoms are worse with movement, no relief with home medication

## 2014-05-15 NOTE — Discharge Instructions (Signed)
1. Medications: percocet, valium, zofran, usual home medications 2. Treatment: rest, drink plenty of fluids,  3. Follow Up: Please followup with your primary doctor and orthopedics for discussion of your diagnoses and further evaluation after today's visit;       Rotator Cuff Injury Rotator cuff injury is any type of injury to the set of muscles and tendons that make up the stabilizing unit of your shoulder. This unit holds the ball of your upper arm bone (humerus) in the socket of your shoulder blade (scapula).  CAUSES Injuries to your rotator cuff most commonly come from sports or activities that cause your arm to be moved repeatedly over your head. Examples of this include throwing, weight lifting, swimming, or racquet sports. Long lasting (chronic) irritation of your rotator cuff can cause soreness and swelling (inflammation), bursitis, and eventual damage to your tendons, such as a tear (rupture). SIGNS AND SYMPTOMS Acute rotator cuff tear:  Sudden tearing sensation followed by severe pain shooting from your upper shoulder down your arm toward your elbow.  Decreased range of motion of your shoulder because of pain and muscle spasm.  Severe pain.  Inability to raise your arm out to the side because of pain and loss of muscle power (large tears). Chronic rotator cuff tear:  Pain that usually is worse at night and may interfere with sleep.  Gradual weakness and decreased shoulder motion as the pain worsens.  Decreased range of motion. Rotator cuff tendinitis:  Deep ache in your shoulder and the outside upper arm over your shoulder.  Pain that comes on gradually and becomes worse when lifting your arm to the side or turning it inward. DIAGNOSIS Rotator cuff injury is diagnosed through a medical history, physical exam, and imaging exam. The medical history helps determine the type of rotator cuff injury. Your health care provider will look at your injured shoulder, feel the  injured area, and ask you to move your shoulder in different positions. X-ray exams typically are done to rule out other causes of shoulder pain, such as fractures. MRI is the exam of choice for the most severe shoulder injuries because the images show muscles and tendons.  TREATMENT  Chronic tear:  Medicine for pain, such as acetaminophen or ibuprofen.  Physical therapy and range-of-motion exercises may be helpful in maintaining shoulder function and strength.  Steroid injections into your shoulder joint.  Surgical repair of the rotator cuff if the injury does not heal with noninvasive treatment. Acute tear:  Anti-inflammatory medicines such as ibuprofen and naproxen to help reduce pain and swelling.  A sling to help support your arm and rest your rotator cuff muscles. Long-term use of a sling is not advised. It may cause significant stiffening of the shoulder joint.  Surgery may be considered within a few weeks, especially in younger, active people, to return the shoulder to full function.  Indications for surgical treatment include the following:  Age younger than 74 years.  Rotator cuff tears that are complete.  Physical therapy, rest, and anti-inflammatory medicines have been used for 6-8 weeks, with no improvement.  Employment or sporting activity that requires constant shoulder use. Tendinitis:  Anti-inflammatory medicines such as ibuprofen and naproxen to help reduce pain and swelling.  A sling to help support your arm and rest your rotator cuff muscles. Long-term use of a sling is not advised. It may cause significant stiffening of the shoulder joint.  Severe tendinitis may require:  Steroid injections into your shoulder joint.  Physical therapy.  Surgery. HOME CARE INSTRUCTIONS   Apply ice to your injury:  Put ice in a plastic bag.  Place a towel between your skin and the bag.  Leave the ice on for 20 minutes, 2-3 times a day.  If you have a shoulder  immobilizer (sling and straps), wear it until told otherwise by your health care provider.  You may want to sleep on several pillows or in a recliner at night to lessen swelling and pain.  Only take over-the-counter or prescription medicines for pain, discomfort, or fever as directed by your health care provider.  Do simple hand squeezing exercises with a soft rubber ball to decrease hand swelling. SEEK MEDICAL CARE IF:   Your shoulder pain increases, or new pain or numbness develops in your arm, hand, or fingers.  Your hand or fingers are colder than your other hand. SEEK IMMEDIATE MEDICAL CARE IF:   Your arm, hand, or fingers are numb or tingling.  Your arm, hand, or fingers are increasingly swollen and painful, or they turn white or blue. MAKE SURE YOU:  Understand these instructions.  Will watch your condition.  Will get help right away if you are not doing well or get worse. Document Released: 07/10/2000 Document Revised: 07/18/2013 Document Reviewed: 02/22/2013 [facility] Patient Information 2015 [place], [place]. This information is not intended to replace advice given to you by your health care provider. Make sure you discuss any questions you have with your health care provider.

## 2014-05-15 NOTE — ED Notes (Signed)
IV insertion attempted x2 without success, PA-C made aware and IV team paged.

## 2014-05-15 NOTE — ED Notes (Signed)
MRI called for update.

## 2014-05-15 NOTE — ED Provider Notes (Signed)
CSN: [id number]     Arrival date & time 05/15/14  1242 History   First MD Initiated Contact with Patient 05/15/14 1556     Chief Complaint  Patient presents with  . Shoulder Pain     (Consider location/radiation/quality/duration/timing/severity/associated sxs/prior Treatment) The history is provided by the patient and medical records. No language interpreter was used.    Destiny Day is a 63 y.o. female  with a hx of HTN, diverticulitis, asthma, IDDM, GI bleeding, CHF, anxiety, CKD presents to the Emergency Department complaining of gradual, persistent, progressively worsening left shoulder and left back pain onset Oct 4th.  Pt reports taking flexeril, zofran and percocet without relief.  Pt reports the pain is constant, sharp, rated at a 10/10.  Pt reports it perhaps started after washing her car, but she is right handed.  Pt was seen by her PCP and was scheduled for an MRI on 05/20/14.  Associated symptoms include radiation of the pain to her left neck and down her left arm. Movement and palpation makes it worse and nothing makes it better.  Pt reports she [place] unable to sleep because of the pain.  Pt denies fever, chills, headache, neck pain, chest pain, SOB, abd pain, N/V/D, weakness, dizziness, syncope.     Past Medical History  Diagnosis Date  . Hypertension   . Diverticulitis   . Hyperlipidemia   . Asthma   . Diabetes mellitus     insulin dependent  . Arthritis     knee  . GI bleed 03/31/2013  . CHF (congestive heart failure)   . Osteoporosis   . Seasonal allergies   . Anxiety   . Abdominal bruit   . Chronic kidney disease     stage 3   Past Surgical History  Procedure Laterality Date  . Abdominal hysterectomy  1993`  . Esophagogastroduodenoscopy N/A 03/31/2013    Procedure: ESOPHAGOGASTRODUODENOSCOPY (EGD);  Surgeon: Gatha Mayer, MD;  Location: [facility] ENDOSCOPY;  Service: Endoscopy;  Laterality: N/A;   Family History  Problem Relation Age of Onset  . Colon cancer  Neg Hx   . Other Mother     not sure of cause of death  . Diabetes Father   . Pancreatic cancer Maternal Grandmother    History  Substance Use Topics  . Smoking status: Former Smoker -- 0.35 packs/day for 40 years    Types: Cigarettes    Quit date: 05/10/2012  . Smokeless tobacco: Never Used  . Alcohol Use: No   OB History   Grav Para Term Preterm Abortions TAB SAB Ect Mult Living                 Review of Systems  Constitutional: Negative for fever, chills, diaphoresis, appetite change, fatigue and unexpected weight change.  HENT: Negative for mouth sores.   Eyes: Negative for visual disturbance.  Respiratory: Negative for cough, chest tightness, shortness of breath and wheezing.   Cardiovascular: Negative for chest pain.  Gastrointestinal: Negative for nausea, vomiting, abdominal pain, diarrhea and constipation.  Endocrine: Negative for polydipsia, polyphagia and polyuria.  Genitourinary: Negative for dysuria, urgency, frequency and hematuria.  Musculoskeletal: Positive for arthralgias (left shoulder) and joint swelling. Negative for back pain, neck pain and neck stiffness.  Skin: Negative for rash and wound.  Allergic/Immunologic: Negative for immunocompromised state.  Neurological: Negative for syncope, light-headedness, numbness and headaches.  Hematological: Does not bruise/bleed easily.  Psychiatric/Behavioral: Negative for sleep disturbance. The patient is not nervous/anxious.   All other systems reviewed  and are negative.     Allergies  Lisinopril and Prednisone  Home Medications   Prior to Admission medications   Medication Sig Start Date End Date Taking? Authorizing Provider  albuterol (PROVENTIL HFA;VENTOLIN HFA) 108 (90 BASE) MCG/ACT inhaler Inhale 2 puffs into the lungs every 6 (six) hours as needed for wheezing.   Yes Historical Provider, MD  amLODipine (NORVASC) 10 MG tablet Take 1 tablet (10 mg total) by mouth daily. 07/23/13  Yes Velvet Bathe, MD   aspirin 81 MG chewable tablet Chew 81 mg by mouth daily.   Yes Historical Provider, MD  atorvastatin (LIPITOR) 20 MG tablet Take 20 mg by mouth daily at 6 PM.   Yes Historical Provider, MD  budesonide-formoterol (SYMBICORT) 160-4.5 MCG/ACT inhaler Inhale 1 puff into the lungs every evening.   Yes Historical Provider, MD  Calcium Carbonate-Vit D-Min (CALCIUM 600+D PLUS MINERALS) 600-400 MG-UNIT TABS Take 1 tablet by mouth daily.   Yes Historical Provider, MD  carvedilol (COREG) 25 MG tablet Take 1 tablet (25 mg total) by mouth 2 (two) times daily with a meal. 06/01/13  Yes Costin Karlyne Greenspan, MD  cyclobenzaprine (FLEXERIL) 10 MG tablet Take 1 tablet (10 mg total) by mouth 2 (two) times daily as needed for muscle spasms. 05/11/14  Yes Jessica C Copland, MD  escitalopram (LEXAPRO) 10 MG tablet Take 10 mg by mouth daily as needed (ANXIETY).   Yes Historical Provider, MD  Ferrous Sulfate (IRON) 325 (65 FE) MG TABS Take 1 capsule by mouth daily.   Yes Historical Provider, MD  furosemide (LASIX) 40 MG tablet Take 1.5 tablets (60 mg total) by mouth 2 (two) times daily. 01/12/14  Yes Barton Dubois, MD  insulin aspart (NOVOLOG) 100 UNIT/ML injection Inject 0-8 Units into the skin 3 (three) times daily before meals. Sliding scale   Yes Historical Provider, MD  insulin detemir (LEVEMIR) 100 UNIT/ML injection Inject 10 Units into the skin at bedtime.   Yes Historical Provider, MD  irbesartan (AVAPRO) 150 MG tablet Take 1 tablet (150 mg total) by mouth daily. 01/12/14  Yes Barton Dubois, MD  isosorbide-hydrALAZINE (BIDIL) 20-37.5 MG per tablet Take 2 tablets by mouth 3 (three) times daily. 01/12/14  Yes Barton Dubois, MD  loratadine (CLARITIN) 10 MG tablet Take 10 mg by mouth daily as needed for allergies.   Yes Historical Provider, MD  metolazone (ZAROXOLYN) 5 MG tablet Take 1 tablet by mouth as needed. 01/17/14  Yes Historical Provider, MD  ondansetron (ZOFRAN ODT) 4 MG disintegrating tablet 4mg  ODT q4 hours prn  nausea/vomit 05/01/14  Yes Pamella Pert, MD  oxyCODONE-acetaminophen (PERCOCET) 5-325 MG per tablet Take 1-2 tablets by mouth every 6 (six) hours as needed for moderate pain. 05/01/14  Yes Pamella Pert, MD  potassium chloride SA (K-DUR,KLOR-CON) 20 MEQ tablet Take 20 mEq by mouth 2 (two) times daily.   Yes Historical Provider, MD  Vitamin D, Ergocalciferol, (DRISDOL) 50000 UNITS CAPS capsule Take 50,000 Units by mouth every 7 (seven) days. On wednesday   Yes Historical Provider, MD  diazepam (VALIUM) 5 MG tablet Take 1 tablet (5 mg total) by mouth every 8 (eight) hours as needed for anxiety (spasms). 05/15/14   Sala Tague, PA-C  ondansetron (ZOFRAN ODT) 4 MG disintegrating tablet 4mg  ODT q4 hours prn nausea/vomit 05/15/14   Avyon Herendeen, PA-C  ONE TOUCH ULTRA TEST test strip  12/19/13   Historical Provider, MD  oxyCODONE-acetaminophen (PERCOCET) 5-325 MG per tablet Take 1-2 tablets by mouth every 4 (four) hours as needed.  05/15/14   Yaresly Menzel, PA-C   BP 209/99  Pulse 100  Temp(Src) 97.8 F (36.6 C) (Oral)  Resp 19  SpO2 100% Physical Exam  Nursing note and vitals reviewed. Constitutional: She is oriented to person, place, and time. She appears well-developed and well-nourished. No distress.  HENT:  Head: Normocephalic and atraumatic.  Mouth/Throat: Oropharynx is clear and moist.  Eyes: Conjunctivae and EOM are normal. Pupils are equal, round, and reactive to light. No scleral icterus.  No horizontal, vertical or rotational nystagmus  Neck: Normal range of motion. Neck supple.  Full active and passive ROM with mild pain No midline tenderness; left-sided paraspinal tenderness No nuchal rigidity or meningeal signs  Cardiovascular: Normal rate, regular rhythm, normal heart sounds and intact distal pulses.   No murmur heard. Capillary refill < 3 sec  Pulmonary/Chest: Effort normal and breath sounds normal. No respiratory distress. She has no wheezes. She has no  rales.  Abdominal: Soft. Bowel sounds are normal. There is no tenderness. There is no rebound and no guarding.  Musculoskeletal: Normal range of motion. She exhibits tenderness. She exhibits no edema.  ROM: Significantly decreased range of motion of the left shoulder secondary to poor effort in significant pain;   Lymphadenopathy:    She has no cervical adenopathy.  Neurological: She is alert and oriented to person, place, and time. She has normal reflexes. No cranial nerve deficit. She exhibits normal muscle tone. Coordination normal.  Mental Status:  Alert, oriented, thought content appropriate. Speech fluent without evidence of aphasia. Able to follow 2 step commands without difficulty.  Cranial Nerves:  II:  Peripheral visual fields grossly normal, pupils equal, round, reactive to light III,IV, VI: ptosis not present, extra-ocular motions intact bilaterally  V,VII: smile symmetric, facial light touch sensation equal VIII: hearing grossly normal bilaterally  IX,X: gag reflex present  XI: bilateral shoulder shrug equal and strong XII: midline tongue extension  Motor:  5/5 in lower extremities bilaterally including dorsiflexion/plantar flexion 5/5 in upper extremities bilaterally including strong and equal grip strength; excluding the left shoulder which is 2/5 secondary to pain Sensory: Pinprick and light touch normal in all extremities.  Deep Tendon Reflexes: 2+ and symmetric  Cerebellar: normal finger-to-nose with bilateral upper extremities Gait: normal gait and balance CV: distal pulses palpable throughout  No clonus  Skin: Skin is warm and dry. No rash noted. She is not diaphoretic.  No tenting of the skin  Psychiatric: She has a normal mood and affect. Her behavior is normal. Judgment and thought content normal.    ED Course  Procedures (including critical care time) Labs Review Labs Reviewed  CBC - Abnormal; Notable for the following:    Hemoglobin 11.2 (*)    HCT 33.0  (*)    All other components within normal limits  BASIC METABOLIC PANEL - Abnormal; Notable for the following:    Sodium 134 (*)    Potassium 3.3 (*)    Chloride 91 (*)    Glucose, Bld 273 (*)    BUN 38 (*)    Creatinine, Ser 2.58 (*)    GFR calc non Af Amer 19 (*)    GFR calc Af Amer 22 (*)    All other components within normal limits  TROPONIN I    Imaging Review Dg Chest 2 View  05/15/2014   CLINICAL DATA:  63 year old female complaining of left-sided chest pain and shortness of breath. History of hypertension.  EXAM: CHEST  2 VIEW  COMPARISON:  Chest x-ray  05/11/2014.  FINDINGS: Lung volumes are normal. No consolidative airspace disease. No pleural effusions. Heart size is borderline enlarged. The patient is rotated to the right on today's exam, resulting in distortion of the mediastinal contours and reduced diagnostic sensitivity and specificity for mediastinal pathology. Pulmonary venous congestion, without frank pulmonary edema. Atherosclerosis in the thoracic aorta.  IMPRESSION: 1. Borderline cardiomegaly with pulmonary venous congestion, but no frank pulmonary edema. 2. Atherosclerosis.   Electronically Signed   By: Vinnie Langton M.D.   On: 05/15/2014 18:20   Mr Cervical Spine Wo Contrast  05/15/2014   CLINICAL DATA:  Initial evaluation for left shoulder pain and arm pain since 04/29/2014. No known injury.  EXAM: MRI CERVICAL SPINE WITHOUT CONTRAST  TECHNIQUE: Multiplanar, multisequence MR imaging of the cervical spine was performed. No intravenous contrast was administered.  COMPARISON:  Prior radiograph from 05/11/2014  FINDINGS: Hyperintense T2 signal intensity within the central pons may be related to chronic small vessel ischemic changes. Visualized portions of the brain and posterior fossa are otherwise unremarkable. Craniocervical junction is widely patent.  There is slight reversal of the normal cervical lordosis without listhesis. Vertebral body heights are preserved. No  acute fracture. No focal osseous lesion. Signal intensity within the vertebral body bone marrow is normal.  Signal intensity within the cervical spinal cord is within normal limits.  Paraspinous soft tissues are unremarkable. Normal flow voids seen within the vertebral arteries bilaterally.  C2-3: Mild diffuse degenerative disc osteophyte with bilateral uncovertebral spurring and facet arthrosis, left slightly worse than right. There is resultant mild bilateral foraminal narrowing without significant central canal stenosis.  C3-4: Diffuse degenerative disc osteophyte with bilateral uncovertebral spurring and facet arthrosis. There is resultant severe left with moderate right foraminal narrowing. Posterior disc osteophyte indents and largely effaces the ventral thecal sac with resultant moderate canal stenosis. Mild flattening of the cervical spinal cord present without cord signal change.  C4-5: Diffuse degenerative disc osteophyte with bilateral uncovertebral spurring and facet arthrosis. There is resultant severe left with moderate to severe right foraminal narrowing. Posterior disc osteophyte narrows the thecal sac and results in moderate canal stenosis.  C5-6: Diffuse degenerative disc osteophyte with bilateral uncovertebral spurring and facet arthrosis. Large central posterior disc osteophyte indents the ventral thecal sac and impinges upon the cervical spinal cord. There is mild cord flattening without cord signal changes. There is moderate bilateral foraminal narrowing with moderate canal stenosis.  C6-7: Diffuse degenerative disc osteophyte with bilateral uncovertebral spurring and facet arthrosis. Posterior disc osteophyte indents the ventral thecal sac and impinges upon the cervical spinal cord with secondary cord flattening. No associated cord signal changes. There is resultant moderate canal stenosis. There is moderate left with severe right foraminal narrowing  C7-T1: Broad-based degenerative disc  osteophyte with left greater than right uncovertebral spurring. Left foraminal degenerative disc osteophyte and facet arthrosis results in fairly severe left foraminal stenosis. There is mild right foraminal narrowing at this level as well. No significant canal stenosis.  IMPRESSION: 1. Diffuse degenerative disc osteophyte and bilateral facet arthropathy at C3-4 and C4-5 with resultant severe left foraminal narrowing, most severe at C3-4. 2. Diffuse degenerative changes extending from C3-4 through C6-7 with resultant moderate canal stenosis. Multilevel foraminal narrowing at these levels as above.   Electronically Signed   By: Jeannine Boga M.D.   On: 05/15/2014 22:39     EKG Interpretation   Date/Time:  Tuesday May 15 2014 12:55:14 EDT Ventricular Rate:  112 PR Interval:  144 QRS Duration: 78  QT Interval:  348 QTC Calculation: 475 R Axis:   31 Text Interpretation:  Sinus tachycardia Septal infarct , age undetermined  T wave abnormality, consider lateral ischemia Abnormal ECG Confirmed by  ALLEN  MD, ANTHONY ([id number]) on 05/15/2014 5:48:25 PM      MDM   Final diagnoses:  Left shoulder pain  Supraspinatus tendon tear, left, initial encounter   [name] presents with Left shoulder pain, ?radicular in nature. Pt with poor effort in the LUE due to pain.  Pt also significantly hypertensive but without chest pain, shortness of breath, headache, vision changes.  Highly unlikely to be aortic dissection with length of symptoms and absence of anginal symptoms.   Patient reports taking her hypertension medications today as directed.  We'll attempt blood pressure and pain control. MRI of cervical spine and shoulder.  8:29 PM Pt pending MRI. Her blood pressure is increasing however so is her pain. Will give pain control and reassess.  9:30PM Patient is significantly decreased blood pressure.  No difference between left and right arm blood pressures.  Highly doubt aortic  dissection.  No evidence of hypertensive urgency as patient is asymptomatic aside from her shoulder pain.  MRI pending.  11:15 PM MRI shoulder: complete supraspinatus tendon tear w/ mild atrophy MRI Cervical spine: Diffuse degenerative disc osteophyte and bilateral facet arthropathy at C3-4 and C4-5 with resultant severe left foraminal narrowing, most severe at C3-4 and diffuse degenerative changes extending from C3-4 through C6-7 with resultant moderate canal stenosis.   Foraminal narrowing and supraspinatus tendon tear are the source of patient's pain. Recommend close primary care in orthopedic followup for surgical repair. Patient is resting comfortably this time and is in agreement of the plan.   I have personally reviewed patient's vitals, nursing note and any pertinent labs or imaging.  I performed an undressed physical exam.    It has been determined that no acute conditions requiring further emergency intervention are present at this time. The patient/guardian have been advised of the diagnosis and plan. I reviewed all labs and imaging including any potential incidental findings. We have discussed signs and symptoms that warrant return to the ED, such as unbearable pain, change in pain, other concerning symptoms.  Patient/guardian has voiced understanding and agreed to follow-up with the PCP or specialist in 7 days.  Vital signs are stable at discharge.   BP 181/96  Pulse 97  Temp(Src) 98.5 F (36.9 C) (Oral)  Resp 12  SpO2 100%   The patient was discussed with and seen by Dr. Zenia Resides who agrees with the treatment plan.         Jarrett Soho Easton Fetty, PA-C 05/16/14 0221

## 2014-05-15 NOTE — ED Notes (Signed)
IV team returned paged, informed pt need IV now, BP-262/142.

## 2014-05-15 NOTE — ED Notes (Signed)
Per PA, MRI called; pt placed on 2 L [place]. RN to observe for first part of MRI.

## 2014-05-15 NOTE — ED Notes (Signed)
PT HUSBAND HAS COME BY TO CHECK ON PT. HE HAS BEEN UPDATED ON THE DELAY IN GETTING MRI. (THEY ADVISE WILL BE AROUND 8 PM). HE WILL RETURN AROUND 9 PM

## 2014-05-15 NOTE — ED Notes (Signed)
RN called MRI; pt reportedly doing well.

## 2014-05-15 NOTE — ED Notes (Signed)
Pt transported to MRI and is alert and talking to pt; sating 100% RA during transport. PA aware. MRI staff instructed to place on 2 L [place] for procedure.

## 2014-05-15 NOTE — Progress Notes (Signed)
Orthopedic Tech Progress Note Patient Details:  Destiny Day 03-18-1951 [id number]  Ortho Devices Type of Ortho Device: Arm sling Ortho Device/Splint Interventions: Application   Katheren Shams 05/15/2014, 11:52 PM

## 2014-05-15 NOTE — ED Notes (Signed)
Pt requesting meds for claustrophobia before MRI

## 2014-05-15 NOTE — ED Notes (Addendum)
Patient transported to MRI without distress.

## 2014-05-18 NOTE — ED Provider Notes (Signed)
Medical screening examination/treatment/procedure(s) were performed by non-physician practitioner and as supervising physician I was immediately available for consultation/collaboration.   EKG Interpretation   Date/Time:  Tuesday May 15 2014 12:55:14 EDT Ventricular Rate:  112 PR Interval:  144 QRS Duration: 78 QT Interval:  348 QTC Calculation: 475 R Axis:   31 Text Interpretation:  Sinus tachycardia Septal infarct , age undetermined  T wave abnormality, consider lateral ischemia Abnormal ECG Confirmed by  Zenia Resides  MD, Jaheim Canino ([id number]) on 05/15/2014 5:48:25 PM       Leota Jacobsen, MD 05/18/14 1150

## 2014-05-20 ENCOUNTER — Other Ambulatory Visit: Payer: BC Managed Care – PPO

## 2014-06-12 NOTE — Telephone Encounter (Signed)
Received notification from [facility] that patient was unable to afford CPAP setup.

## 2014-06-20 ENCOUNTER — Other Ambulatory Visit: Payer: Self-pay

## 2014-06-20 DIAGNOSIS — Z01810 Encounter for preprocedural cardiovascular examination: Secondary | ICD-10-CM

## 2014-06-20 DIAGNOSIS — N184 Chronic kidney disease, stage 4 (severe): Secondary | ICD-10-CM

## 2014-06-26 ENCOUNTER — Encounter: Payer: Self-pay | Admitting: Vascular Surgery

## 2014-06-27 ENCOUNTER — Ambulatory Visit (INDEPENDENT_AMBULATORY_CARE_PROVIDER_SITE_OTHER)
Admission: RE | Admit: 2014-06-27 | Discharge: 2014-06-27 | Disposition: A | Discharge status: Home / Self Care | Payer: BC Managed Care – PPO | Source: Ambulatory Visit | Attending: Vascular Surgery | Admitting: Vascular Surgery

## 2014-06-27 ENCOUNTER — Encounter: Payer: Self-pay | Admitting: Vascular Surgery

## 2014-06-27 ENCOUNTER — Ambulatory Visit (HOSPITAL_COMMUNITY)
Admission: RE | Admit: 2014-06-27 | Discharge: 2014-06-27 | Disposition: A | Discharge status: Home / Self Care | Payer: BC Managed Care – PPO | Source: Ambulatory Visit | Attending: Vascular Surgery | Admitting: Vascular Surgery

## 2014-06-27 ENCOUNTER — Ambulatory Visit (INDEPENDENT_AMBULATORY_CARE_PROVIDER_SITE_OTHER): Payer: BC Managed Care – PPO | Admitting: Vascular Surgery

## 2014-06-27 VITALS — BP 208/93 | HR 89 | Ht 64.0 in | Wt 144.0 lb

## 2014-06-27 DIAGNOSIS — N184 Chronic kidney disease, stage 4 (severe): Secondary | ICD-10-CM | POA: Diagnosis not present

## 2014-06-27 DIAGNOSIS — Z01810 Encounter for preprocedural cardiovascular examination: Secondary | ICD-10-CM | POA: Diagnosis not present

## 2014-06-27 NOTE — Progress Notes (Signed)
Patient ID: Destiny Day, female   DOB: 09-15-1950, 63 y.o.   MRN: [id number]  Reason for Consult: Evaluate for hemodialysis access.   Referred by Dr. Jamal Maes  Subjective:     HPI:  Destiny Day is a 63 y.o. female who was referred for evaluation for hemodialysis access. She has stage IV chronic kidney disease. This is secondary to diabetes and hypertension. She has diabetic nephropathy and nephrotic proteinuria. I have reviewed her records from Dr. Allean Found office. Her GFR on 06/14/2014 was 21.  The patient is right-handed. She has had some fluid retention in the past. She denies any history of anorexia, fatigue, palpitations, nausea, or vomiting.  Past Medical History  Diagnosis Date  . Hypertension   . Diverticulitis   . Hyperlipidemia   . Asthma   . Diabetes mellitus     insulin dependent  . Arthritis     knee  . GI bleed 03/31/2013  . CHF (congestive heart failure)   . Osteoporosis   . Seasonal allergies   . Anxiety   . Abdominal bruit   . Chronic kidney disease     stage 3   Family History  Problem Relation Age of Onset  . Colon cancer Neg Hx   . Other Mother     not sure of cause of death  . Diabetes Father   . Pancreatic cancer Maternal Grandmother    Past Surgical History  Procedure Laterality Date  . Abdominal hysterectomy  1993`  . Esophagogastroduodenoscopy N/A 03/31/2013    Procedure: ESOPHAGOGASTRODUODENOSCOPY (EGD);  Surgeon: Gatha Mayer, MD;  Location: [facility] ENDOSCOPY;  Service: Endoscopy;  Laterality: N/A;    Short Social History:  History  Substance Use Topics  . Smoking status: Former Smoker -- 0.35 packs/day for 40 years    Types: Cigarettes    Quit date: 05/10/2012  . Smokeless tobacco: Never Used  . Alcohol Use: No    Allergies  Allergen Reactions  . Lisinopril Cough  . Prednisone Other (See Comments)    Excessive fluid buildup    Current Outpatient Prescriptions  Medication Sig Dispense Refill  .  albuterol (PROVENTIL HFA;VENTOLIN HFA) 108 (90 BASE) MCG/ACT inhaler Inhale 2 puffs into the lungs every 6 (six) hours as needed for wheezing.    Marland Kitchen amLODipine (NORVASC) 10 MG tablet Take 1 tablet (10 mg total) by mouth daily. 30 tablet 0  . aspirin 81 MG chewable tablet Chew 81 mg by mouth daily.    Marland Kitchen atorvastatin (LIPITOR) 20 MG tablet Take 20 mg by mouth daily at 6 PM.    . budesonide-formoterol (SYMBICORT) 160-4.5 MCG/ACT inhaler Inhale 1 puff into the lungs every evening.    . Calcium Carbonate-Vit D-Min (CALCIUM 600+D PLUS MINERALS) 600-400 MG-UNIT TABS Take 1 tablet by mouth daily.    . carvedilol (COREG) 25 MG tablet Take 1 tablet (25 mg total) by mouth 2 (two) times daily with a meal. 60 tablet 1  . cyclobenzaprine (FLEXERIL) 10 MG tablet Take 1 tablet (10 mg total) by mouth 2 (two) times daily as needed for muscle spasms. 20 tablet 0  . diazepam (VALIUM) 5 MG tablet Take 1 tablet (5 mg total) by mouth every 8 (eight) hours as needed for anxiety (spasms). 20 tablet 0  . escitalopram (LEXAPRO) 10 MG tablet Take 10 mg by mouth daily as needed (ANXIETY).    . Ferrous Sulfate (IRON) 325 (65 FE) MG TABS Take 1 capsule by mouth daily.    . furosemide (  LASIX) 40 MG tablet Take 1.5 tablets (60 mg total) by mouth 2 (two) times daily. 90 tablet 1  . insulin aspart (NOVOLOG) 100 UNIT/ML injection Inject 0-8 Units into the skin 3 (three) times daily before meals. Sliding scale    . insulin detemir (LEVEMIR) 100 UNIT/ML injection Inject 10 Units into the skin at bedtime.    . irbesartan (AVAPRO) 150 MG tablet Take 1 tablet (150 mg total) by mouth daily. 30 tablet 1  . isosorbide-hydrALAZINE (BIDIL) 20-37.5 MG per tablet Take 2 tablets by mouth 3 (three) times daily. 180 tablet 1  . loratadine (CLARITIN) 10 MG tablet Take 10 mg by mouth daily as needed for allergies.    . metolazone (ZAROXOLYN) 5 MG tablet Take 1 tablet by mouth as needed.    . ondansetron (ZOFRAN ODT) 4 MG disintegrating tablet 4mg  ODT  q4 hours prn nausea/vomit 25 tablet 0  . ondansetron (ZOFRAN ODT) 4 MG disintegrating tablet 4mg  ODT q4 hours prn nausea/vomit 10 tablet 0  . ONE TOUCH ULTRA TEST test strip     . oxyCODONE-acetaminophen (PERCOCET) 5-325 MG per tablet Take 1-2 tablets by mouth every 6 (six) hours as needed for moderate pain. 25 tablet 0  . oxyCODONE-acetaminophen (PERCOCET) 5-325 MG per tablet Take 1-2 tablets by mouth every 4 (four) hours as needed. 20 tablet 0  . potassium chloride SA (K-DUR,KLOR-CON) 20 MEQ tablet Take 20 mEq by mouth 2 (two) times daily.    . Vitamin D, Ergocalciferol, (DRISDOL) 50000 UNITS CAPS capsule Take 50,000 Units by mouth every 7 (seven) days. On wednesday     No current facility-administered medications for this visit.    Review of Systems  Constitutional: Negative for chills and fever.  Eyes: Negative for loss of vision.  Respiratory: Positive for wheezing. Negative for cough.  Cardiovascular: Negative for chest pain, chest tightness, claudication, dyspnea with exertion, orthopnea and palpitations.  GI: Negative for blood in stool and vomiting.  GU: Negative for dysuria and hematuria.  Musculoskeletal: Negative for leg pain, joint pain and myalgias.  Skin: Negative for rash and wound.  Neurological: Negative for dizziness and speech difficulty.  Hematologic: Negative for bruises/bleeds easily. Psychiatric: Negative for depressed mood.        Objective:  Objective  Filed Vitals:   06/27/14 1541  BP: 208/93  Pulse: 89  Height: 5\' 4"  (1.626 m)  Weight: 144 lb (65.318 kg)  SpO2: 98%   Body mass index is 24.71 kg/(m^2).  Physical Exam  Constitutional: She is oriented to person, place, and time. She appears well-developed and well-nourished.  HENT:  Head: Normocephalic and atraumatic.  Neck: Neck supple. No JVD present. No thyromegaly present.  Cardiovascular: Normal rate, regular rhythm and normal heart sounds.  Exam reveals no friction rub.   No murmur  heard. Pulses:      Radial pulses are 2+ on the right side, and 2+ on the left side.  I do not detect carotid bruits.  Pulmonary/Chest: Breath sounds normal. She has no wheezes. She has no rales.  Abdominal: Soft. Bowel sounds are normal. There is no tenderness.  Musculoskeletal: Normal range of motion. She exhibits no edema.  Lymphadenopathy:    She has no cervical adenopathy.  Neurological: She is alert and oriented to person, place, and time. She has normal strength. No sensory deficit.  Skin: No lesion and no rash noted.  Psychiatric: She has a normal mood and affect.   Data: I have independently interpreted her vein mapping today. The forearm  and upper arm cephalic vein on the right are somewhat small but potentially usable. The basilic vein appears reasonable in size on the right. On the left side the forearm and upper arm cephalic vein appear potentially usable for a fistula. The basilic vein also looks reasonable.  I have also independently interpreted her arterial Doppler study which shows triphasic Doppler signals in the radial and ulnar positions bilaterally.      Assessment/Plan:    CKD (chronic kidney disease) stage 4, GFR 15-29 ml/min The patient appears to be a reasonable candidate for a fistula in the left arm given that she is right-handed. Of note she does have some rotator cuff issues on the left social may want to consider having this done in the right arm which I think is reasonable. Since she is not on dialysis I have explained that we typically not place an AV graft at this time. I have explained the indications for placement of an AV fistula. I've explained that if at all possible we will place an AV fistula.  I have reviewed the risks of placement of an AV fistula including but not limited to: failure of the fistula to mature, need for subsequent interventions, and thrombosis. All the patient's questions were answered and they are agreeable to proceed with surgery. This  has been scheduled for 07/10/2014.     Angelia Mould MD [facility]

## 2014-06-27 NOTE — Assessment & Plan Note (Signed)
The patient appears to be a reasonable candidate for a fistula in the left arm given that she is right-handed. Of note she does have some rotator cuff issues on the left social may want to consider having this done in the right arm which I think is reasonable. Since she is not on dialysis I have explained that we typically not place an AV graft at this time. I have explained the indications for placement of an AV fistula. I've explained that if at all possible we will place an AV fistula.  I have reviewed the risks of placement of an AV fistula including but not limited to: failure of the fistula to mature, need for subsequent interventions, and thrombosis. All the patient's questions were answered and they are agreeable to proceed with surgery. This has been scheduled for 07/10/2014.

## 2014-06-28 ENCOUNTER — Other Ambulatory Visit: Payer: Self-pay

## 2014-07-09 ENCOUNTER — Encounter (HOSPITAL_COMMUNITY): Payer: Self-pay | Admitting: *Deleted

## 2014-07-09 MED ORDER — CHLORHEXIDINE GLUCONATE 4 % EX LIQD
60.0000 mL | Freq: Once | CUTANEOUS | Status: DC
  Filled 2014-07-09: qty 60

## 2014-07-09 MED ORDER — DEXTROSE 5 % IV SOLN
1.5000 g | INTRAVENOUS | Status: AC
  Administered 2014-07-10: 1.5 g via INTRAVENOUS
  Filled 2014-07-09: qty 1.5

## 2014-07-09 MED ORDER — SODIUM CHLORIDE 0.9 % IV SOLN
INTRAVENOUS | Status: DC
  Administered 2014-07-10: 09:00:00 via INTRAVENOUS

## 2014-07-09 MED ORDER — MUPIROCIN 2 % EX OINT: 1.0000 "application " | TOPICAL_OINTMENT | Freq: Once | CUTANEOUS | Status: DC

## 2014-07-09 NOTE — Progress Notes (Signed)
Pt denies any recent chest pain or sob. Denies cardiac history

## 2014-07-10 ENCOUNTER — Ambulatory Visit (HOSPITAL_COMMUNITY)
Admission: RE | Admit: 2014-07-10 | Discharge: 2014-07-10 | Disposition: A | Discharge status: Home / Self Care | Payer: BC Managed Care – PPO | Source: Ambulatory Visit | Attending: Vascular Surgery | Admitting: Vascular Surgery

## 2014-07-10 ENCOUNTER — Ambulatory Visit (HOSPITAL_COMMUNITY): Payer: BC Managed Care – PPO | Admitting: Certified Registered Nurse Anesthetist

## 2014-07-10 ENCOUNTER — Other Ambulatory Visit: Payer: Self-pay | Admitting: *Deleted

## 2014-07-10 ENCOUNTER — Encounter (HOSPITAL_COMMUNITY)
Admission: RE | Disposition: A | Discharge status: Home / Self Care | Payer: Self-pay | Source: Ambulatory Visit | Attending: Vascular Surgery

## 2014-07-10 ENCOUNTER — Encounter (HOSPITAL_COMMUNITY): Payer: Self-pay | Admitting: Certified Registered Nurse Anesthetist

## 2014-07-10 DIAGNOSIS — Z794 Long term (current) use of insulin: Secondary | ICD-10-CM | POA: Insufficient documentation

## 2014-07-10 DIAGNOSIS — Z4931 Encounter for adequacy testing for hemodialysis: Secondary | ICD-10-CM

## 2014-07-10 DIAGNOSIS — M1990 Unspecified osteoarthritis, unspecified site: Secondary | ICD-10-CM | POA: Diagnosis not present

## 2014-07-10 DIAGNOSIS — Z7982 Long term (current) use of aspirin: Secondary | ICD-10-CM | POA: Diagnosis not present

## 2014-07-10 DIAGNOSIS — Z87891 Personal history of nicotine dependence: Secondary | ICD-10-CM | POA: Diagnosis not present

## 2014-07-10 DIAGNOSIS — M810 Age-related osteoporosis without current pathological fracture: Secondary | ICD-10-CM | POA: Diagnosis not present

## 2014-07-10 DIAGNOSIS — K219 Gastro-esophageal reflux disease without esophagitis: Secondary | ICD-10-CM | POA: Insufficient documentation

## 2014-07-10 DIAGNOSIS — N184 Chronic kidney disease, stage 4 (severe): Secondary | ICD-10-CM

## 2014-07-10 DIAGNOSIS — J45909 Unspecified asthma, uncomplicated: Secondary | ICD-10-CM | POA: Diagnosis not present

## 2014-07-10 DIAGNOSIS — I509 Heart failure, unspecified: Secondary | ICD-10-CM | POA: Diagnosis not present

## 2014-07-10 DIAGNOSIS — I129 Hypertensive chronic kidney disease with stage 1 through stage 4 chronic kidney disease, or unspecified chronic kidney disease: Secondary | ICD-10-CM | POA: Diagnosis not present

## 2014-07-10 DIAGNOSIS — Z833 Family history of diabetes mellitus: Secondary | ICD-10-CM | POA: Diagnosis not present

## 2014-07-10 DIAGNOSIS — G4730 Sleep apnea, unspecified: Secondary | ICD-10-CM | POA: Insufficient documentation

## 2014-07-10 DIAGNOSIS — E119 Type 2 diabetes mellitus without complications: Secondary | ICD-10-CM | POA: Insufficient documentation

## 2014-07-10 DIAGNOSIS — Z79899 Other long term (current) drug therapy: Secondary | ICD-10-CM | POA: Insufficient documentation

## 2014-07-10 DIAGNOSIS — N186 End stage renal disease: Secondary | ICD-10-CM

## 2014-07-10 DIAGNOSIS — E785 Hyperlipidemia, unspecified: Secondary | ICD-10-CM | POA: Diagnosis not present

## 2014-07-10 HISTORY — DX: Sleep apnea, unspecified: G47.30

## 2014-07-10 HISTORY — DX: Pneumonia, unspecified organism: J18.9

## 2014-07-10 HISTORY — DX: Gastro-esophageal reflux disease without esophagitis: K21.9

## 2014-07-10 HISTORY — PX: BASCILIC VEIN TRANSPOSITION: SHX5742

## 2014-07-10 LAB — GLUCOSE, CAPILLARY: Glucose-Capillary: 152 mg/dL — ABNORMAL HIGH (ref 70–99)

## 2014-07-10 LAB — POCT I-STAT 4, (NA,K, GLUC, HGB,HCT)
Glucose, Bld: 157 mg/dL — ABNORMAL HIGH (ref 70–99)
HCT: 31 % — ABNORMAL LOW (ref 36.0–46.0)
Hemoglobin: 10.5 g/dL — ABNORMAL LOW (ref 12.0–15.0)
Potassium: 3.3 mEq/L — ABNORMAL LOW (ref 3.7–5.3)
Sodium: 138 mEq/L (ref 137–147)

## 2014-07-10 LAB — SURGICAL PCR SCREEN
MRSA, PCR: NEGATIVE
Staphylococcus aureus: NEGATIVE

## 2014-07-10 SURGERY — TRANSPOSITION, VEIN, BASILIC
Anesthesia: Monitor Anesthesia Care | Site: Arm Upper | Laterality: Left

## 2014-07-10 MED ORDER — PROTAMINE SULFATE 10 MG/ML IV SOLN
INTRAVENOUS | Status: DC | PRN
  Administered 2014-07-10 (×3): 10 mg via INTRAVENOUS

## 2014-07-10 MED ORDER — PHENYLEPHRINE HCL 10 MG/ML IJ SOLN
INTRAMUSCULAR | Status: DC | PRN
  Administered 2014-07-10: 80 ug via INTRAVENOUS
  Administered 2014-07-10 (×2): 40 ug via INTRAVENOUS
  Administered 2014-07-10: 80 ug via INTRAVENOUS
  Administered 2014-07-10: 40 ug via INTRAVENOUS
  Administered 2014-07-10: 80 ug via INTRAVENOUS
  Administered 2014-07-10 (×3): 40 ug via INTRAVENOUS

## 2014-07-10 MED ORDER — PROPOFOL 10 MG/ML IV BOLUS
INTRAVENOUS | Status: AC
  Filled 2014-07-10: qty 20

## 2014-07-10 MED ORDER — PROPOFOL INFUSION 10 MG/ML OPTIME
INTRAVENOUS | Status: DC | PRN
  Administered 2014-07-10: 50 ug/kg/min via INTRAVENOUS

## 2014-07-10 MED ORDER — PHENYLEPHRINE 40 MCG/ML (10ML) SYRINGE FOR IV PUSH (FOR BLOOD PRESSURE SUPPORT)
PREFILLED_SYRINGE | INTRAVENOUS | Status: AC
  Filled 2014-07-10: qty 10

## 2014-07-10 MED ORDER — OXYCODONE HCL 5 MG PO TABS
ORAL_TABLET | ORAL | Status: AC
  Filled 2014-07-10: qty 1

## 2014-07-10 MED ORDER — OXYCODONE HCL 5 MG/5ML PO SOLN: 5.0000 mg | Freq: Once | ORAL | Status: AC | PRN

## 2014-07-10 MED ORDER — HYDROMORPHONE HCL 1 MG/ML IJ SOLN: 0.2500 mg | INTRAMUSCULAR | Status: DC | PRN

## 2014-07-10 MED ORDER — LIDOCAINE HCL (PF) 1 % IJ SOLN
INTRAMUSCULAR | Status: DC | PRN
  Administered 2014-07-10: 10 mL
  Administered 2014-07-10: 30 mL

## 2014-07-10 MED ORDER — SODIUM CHLORIDE 0.9 % IV SOLN
INTRAVENOUS | Status: DC | PRN
  Administered 2014-07-10: 09:00:00 via INTRAVENOUS

## 2014-07-10 MED ORDER — OXYCODONE-ACETAMINOPHEN 5-325 MG PO TABS: 1.0000 | ORAL_TABLET | Freq: Four times a day (QID) | ORAL | Status: DC | PRN

## 2014-07-10 MED ORDER — FENTANYL CITRATE 0.05 MG/ML IJ SOLN
INTRAMUSCULAR | Status: AC
  Filled 2014-07-10: qty 5

## 2014-07-10 MED ORDER — MIDAZOLAM HCL 5 MG/5ML IJ SOLN
INTRAMUSCULAR | Status: DC | PRN
  Administered 2014-07-10: 1 mg via INTRAVENOUS

## 2014-07-10 MED ORDER — LIDOCAINE HCL (PF) 1 % IJ SOLN
INTRAMUSCULAR | Status: AC
  Filled 2014-07-10: qty 30

## 2014-07-10 MED ORDER — MUPIROCIN 2 % EX OINT
1.0000 "application " | TOPICAL_OINTMENT | Freq: Once | CUTANEOUS | Status: AC
  Administered 2014-07-10: 1 via TOPICAL

## 2014-07-10 MED ORDER — HEPARIN SODIUM (PORCINE) 1000 UNIT/ML IJ SOLN
INTRAMUSCULAR | Status: DC | PRN
  Administered 2014-07-10: 5000 [IU] via INTRAVENOUS

## 2014-07-10 MED ORDER — 0.9 % SODIUM CHLORIDE (POUR BTL) OPTIME
TOPICAL | Status: DC | PRN
  Administered 2014-07-10: 1000 mL

## 2014-07-10 MED ORDER — THROMBIN 20000 UNITS EX SOLR
CUTANEOUS | Status: AC
  Filled 2014-07-10: qty 20000

## 2014-07-10 MED ORDER — LIDOCAINE HCL (CARDIAC) 20 MG/ML IV SOLN
INTRAVENOUS | Status: AC
  Filled 2014-07-10: qty 5

## 2014-07-10 MED ORDER — HEPARIN SODIUM (PORCINE) 1000 UNIT/ML IJ SOLN
INTRAMUSCULAR | Status: AC
  Filled 2014-07-10: qty 1

## 2014-07-10 MED ORDER — PAPAVERINE HCL 30 MG/ML IJ SOLN
INTRAMUSCULAR | Status: AC
  Filled 2014-07-10: qty 2

## 2014-07-10 MED ORDER — ONDANSETRON HCL 4 MG/2ML IJ SOLN
INTRAMUSCULAR | Status: DC | PRN
  Administered 2014-07-10: 4 mg via INTRAVENOUS

## 2014-07-10 MED ORDER — SODIUM CHLORIDE 0.9 % IR SOLN
Status: DC | PRN
  Administered 2014-07-10: 11:00:00

## 2014-07-10 MED ORDER — ONDANSETRON HCL 4 MG/2ML IJ SOLN
INTRAMUSCULAR | Status: AC
  Filled 2014-07-10: qty 2

## 2014-07-10 MED ORDER — PROMETHAZINE HCL 25 MG/ML IJ SOLN
6.2500 mg | INTRAMUSCULAR | Status: DC | PRN
Start: 2014-07-10 — End: 2014-07-10

## 2014-07-10 MED ORDER — ROCURONIUM BROMIDE 50 MG/5ML IV SOLN
INTRAVENOUS | Status: AC
  Filled 2014-07-10: qty 1

## 2014-07-10 MED ORDER — OXYCODONE HCL 5 MG PO TABS
5.0000 mg | ORAL_TABLET | Freq: Once | ORAL | Status: AC | PRN
  Administered 2014-07-10: 5 mg via ORAL

## 2014-07-10 MED ORDER — MUPIROCIN 2 % EX OINT
TOPICAL_OINTMENT | CUTANEOUS | Status: AC
  Filled 2014-07-10: qty 22

## 2014-07-10 MED ORDER — MIDAZOLAM HCL 2 MG/2ML IJ SOLN
INTRAMUSCULAR | Status: AC
  Filled 2014-07-10: qty 2

## 2014-07-10 MED ORDER — FENTANYL CITRATE 0.05 MG/ML IJ SOLN
INTRAMUSCULAR | Status: DC | PRN
  Administered 2014-07-10: 50 ug via INTRAVENOUS

## 2014-07-10 SURGICAL SUPPLY — 32 items
ARMBAND PINK RESTRICT EXTREMIT (MISCELLANEOUS) ×4 IMPLANT
BLADE SURG 10 STRL SS (BLADE) IMPLANT
CANISTER SUCTION 2500CC (MISCELLANEOUS) ×4 IMPLANT
CANNULA VESSEL 3MM 2 BLNT TIP (CANNULA) ×4 IMPLANT
CLIP TI MEDIUM 6 (CLIP) ×4 IMPLANT
CLIP TI WIDE RED SMALL 6 (CLIP) ×12 IMPLANT
COVER PROBE W GEL 5X96 (DRAPES) ×4 IMPLANT
COVER SURGICAL LIGHT HANDLE (MISCELLANEOUS) ×4 IMPLANT
DECANTER SPIKE VIAL GLASS SM (MISCELLANEOUS) IMPLANT
DRAIN PENROSE 1/2X12 LTX STRL (WOUND CARE) IMPLANT
ELECT REM PT RETURN 9FT ADLT (ELECTROSURGICAL) ×4
ELECTRODE REM PT RTRN 9FT ADLT (ELECTROSURGICAL) ×2 IMPLANT
GLOVE BIO SURGEON STRL SZ7.5 (GLOVE) ×4 IMPLANT
GLOVE BIOGEL PI IND STRL 8 (GLOVE) ×2 IMPLANT
GLOVE BIOGEL PI INDICATOR 8 (GLOVE) ×2
GOWN STRL REUS W/ TWL LRG LVL3 (GOWN DISPOSABLE) ×10 IMPLANT
GOWN STRL REUS W/TWL LRG LVL3 (GOWN DISPOSABLE) ×10
KIT BASIN OR (CUSTOM PROCEDURE TRAY) ×4 IMPLANT
KIT ROOM TURNOVER OR (KITS) ×4 IMPLANT
LIQUID BAND (GAUZE/BANDAGES/DRESSINGS) ×4 IMPLANT
NS IRRIG 1000ML POUR BTL (IV SOLUTION) ×4 IMPLANT
PACK CV ACCESS (CUSTOM PROCEDURE TRAY) ×4 IMPLANT
PAD ARMBOARD 7.5X6 YLW CONV (MISCELLANEOUS) ×8 IMPLANT
PROBE PENCIL 8 MHZ STRL DISP (MISCELLANEOUS) ×4 IMPLANT
SPONGE SURGIFOAM ABS GEL 100 (HEMOSTASIS) IMPLANT
SUT PROLENE 6 0 BV (SUTURE) ×8 IMPLANT
SUT SILK 2 0 SH (SUTURE) ×8 IMPLANT
SUT VIC AB 3-0 SH 27 (SUTURE) ×4
SUT VIC AB 3-0 SH 27X BRD (SUTURE) ×4 IMPLANT
SUT VICRYL 4-0 PS2 18IN ABS (SUTURE) ×8 IMPLANT
UNDERPAD 30X30 INCONTINENT (UNDERPADS AND DIAPERS) ×4 IMPLANT
WATER STERILE IRR 1000ML POUR (IV SOLUTION) ×4 IMPLANT

## 2014-07-10 NOTE — Transfer of Care (Signed)
Immediate Anesthesia Transfer of Care Note  Patient: Destiny Day  Procedure(s) Performed: Procedure(s): [place] (Left)  Patient Location: PACU  Anesthesia Type:MAC  Level of Consciousness: awake, alert  and oriented  Airway & Oxygen Therapy: Patient Spontanous Breathing and Patient connected to nasal cannula oxygen  Post-op Assessment: Report given to PACU RN, Post -op Vital signs reviewed and stable and Patient moving all extremities X 4  Post vital signs: Reviewed and stable  Complications: No apparent anesthesia complications

## 2014-07-10 NOTE — Anesthesia Postprocedure Evaluation (Signed)
Anesthesia Post Note  Patient: Destiny Day  Procedure(s) Performed: Procedure(s) (LRB): BASCILIC VEIN TRANSPOSITION (Left)  Anesthesia type: MAC  Patient location: PACU  Post pain: Pain level controlled  Post assessment: Patient's Cardiovascular Status Stable  Last Vitals:  Filed Vitals:   07/10/14 1430  BP: 110/72  Pulse: 66  Temp:   Resp: 13    Post vital signs: Reviewed and stable  Level of consciousness: sedated  Complications: No apparent anesthesia complications

## 2014-07-10 NOTE — H&P (View-Only) (Signed)
Patient ID: Destiny Day, female   DOB: 14-Feb-1951, 63 y.o.   MRN: [id number]  Reason for Consult: Evaluate for hemodialysis access.   Referred by Dr. Jamal Maes  Subjective:     HPI:  Destiny Day is a 63 y.o. female who was referred for evaluation for hemodialysis access. She has stage IV chronic kidney disease. This is secondary to diabetes and hypertension. She has diabetic nephropathy and nephrotic proteinuria. I have reviewed her records from Dr. Allean Found office. Her GFR on 06/14/2014 was 21.  The patient is right-handed. She has had some fluid retention in the past. She denies any history of anorexia, fatigue, palpitations, nausea, or vomiting.  Past Medical History  Diagnosis Date  . Hypertension   . Diverticulitis   . Hyperlipidemia   . Asthma   . Diabetes mellitus     insulin dependent  . Arthritis     knee  . GI bleed 03/31/2013  . CHF (congestive heart failure)   . Osteoporosis   . Seasonal allergies   . Anxiety   . Abdominal bruit   . Chronic kidney disease     stage 3   Family History  Problem Relation Age of Onset  . Colon cancer Neg Hx   . Other Mother     not sure of cause of death  . Diabetes Father   . Pancreatic cancer Maternal Grandmother    Past Surgical History  Procedure Laterality Date  . Abdominal hysterectomy  1993`  . Esophagogastroduodenoscopy N/A 03/31/2013    Procedure: ESOPHAGOGASTRODUODENOSCOPY (EGD);  Surgeon: Gatha Mayer, MD;  Location[facility] ENDOSCOPY;  Service: Endoscopy;  Laterality: N/A;    Short Social History:  History  Substance Use Topics  . Smoking status: Former Smoker -- 0.35 packs/day for 40 years    Types: Cigarettes    Quit date: 05/10/2012  . Smokeless tobacco: Never Used  . Alcohol Use: No    Allergies  Allergen Reactions  . Lisinopril Cough  . Prednisone Other (See Comments)    Excessive fluid buildup    Current Outpatient Prescriptions  Medication Sig Dispense Refill  .  albuterol (PROVENTIL HFA;VENTOLIN HFA) 108 (90 BASE) MCG/ACT inhaler Inhale 2 puffs into the lungs every 6 (six) hours as needed for wheezing.    Marland Kitchen amLODipine (NORVASC) 10 MG tablet Take 1 tablet (10 mg total) by mouth daily. 30 tablet 0  . aspirin 81 MG chewable tablet Chew 81 mg by mouth daily.    Marland Kitchen atorvastatin (LIPITOR) 20 MG tablet Take 20 mg by mouth daily at 6 PM.    . budesonide-formoterol (SYMBICORT) 160-4.5 MCG/ACT inhaler Inhale 1 puff into the lungs every evening.    . Calcium Carbonate-Vit D-Min (CALCIUM 600+D PLUS MINERALS) 600-400 MG-UNIT TABS Take 1 tablet by mouth daily.    . carvedilol (COREG) 25 MG tablet Take 1 tablet (25 mg total) by mouth 2 (two) times daily with a meal. 60 tablet 1  . cyclobenzaprine (FLEXERIL) 10 MG tablet Take 1 tablet (10 mg total) by mouth 2 (two) times daily as needed for muscle spasms. 20 tablet 0  . diazepam (VALIUM) 5 MG tablet Take 1 tablet (5 mg total) by mouth every 8 (eight) hours as needed for anxiety (spasms). 20 tablet 0  . escitalopram (LEXAPRO) 10 MG tablet Take 10 mg by mouth daily as needed (ANXIETY).    . Ferrous Sulfate (IRON) 325 (65 FE) MG TABS Take 1 capsule by mouth daily.    . furosemide (  LASIX) 40 MG tablet Take 1.5 tablets (60 mg total) by mouth 2 (two) times daily. 90 tablet 1  . insulin aspart (NOVOLOG) 100 UNIT/ML injection Inject 0-8 Units into the skin 3 (three) times daily before meals. Sliding scale    . insulin detemir (LEVEMIR) 100 UNIT/ML injection Inject 10 Units into the skin at bedtime.    . irbesartan (AVAPRO) 150 MG tablet Take 1 tablet (150 mg total) by mouth daily. 30 tablet 1  . isosorbide-hydrALAZINE (BIDIL) 20-37.5 MG per tablet Take 2 tablets by mouth 3 (three) times daily. 180 tablet 1  . loratadine (CLARITIN) 10 MG tablet Take 10 mg by mouth daily as needed for allergies.    . metolazone (ZAROXOLYN) 5 MG tablet Take 1 tablet by mouth as needed.    . ondansetron (ZOFRAN ODT) 4 MG disintegrating tablet 4mg  ODT  q4 hours prn nausea/vomit 25 tablet 0  . ondansetron (ZOFRAN ODT) 4 MG disintegrating tablet 4mg  ODT q4 hours prn nausea/vomit 10 tablet 0  . ONE TOUCH ULTRA TEST test strip     . oxyCODONE-acetaminophen (PERCOCET) 5-325 MG per tablet Take 1-2 tablets by mouth every 6 (six) hours as needed for moderate pain. 25 tablet 0  . oxyCODONE-acetaminophen (PERCOCET) 5-325 MG per tablet Take 1-2 tablets by mouth every 4 (four) hours as needed. 20 tablet 0  . potassium chloride SA (K-DUR,KLOR-CON) 20 MEQ tablet Take 20 mEq by mouth 2 (two) times daily.    . Vitamin D, Ergocalciferol, (DRISDOL) 50000 UNITS CAPS capsule Take 50,000 Units by mouth every 7 (seven) days. On wednesday     No current facility-administered medications for this visit.    Review of Systems  Constitutional: Negative for chills and fever.  Eyes: Negative for loss of vision.  Respiratory: Positive for wheezing. Negative for cough.  Cardiovascular: Negative for chest pain, chest tightness, claudication, dyspnea with exertion, orthopnea and palpitations.  GI: Negative for blood in stool and vomiting.  GU: Negative for dysuria and hematuria.  Musculoskeletal: Negative for leg pain, joint pain and myalgias.  Skin: Negative for rash and wound.  Neurological: Negative for dizziness and speech difficulty.  Hematologic: Negative for bruises/bleeds easily. Psychiatric: Negative for depressed mood.        Objective:  Objective  Filed Vitals:   06/27/14 1541  BP: 208/93  Pulse: 89  Height: 5\' 4"  (1.626 m)  Weight: 144 lb (65.318 kg)  SpO2: 98%   Body mass index is 24.71 kg/(m^2).  Physical Exam  Constitutional: She is oriented to person, place, and time. She appears well-developed and well-nourished.  HENT:  Head: Normocephalic and atraumatic.  Neck: Neck supple. No JVD present. No thyromegaly present.  Cardiovascular: Normal rate, regular rhythm and normal heart sounds.  Exam reveals no friction rub.   No murmur  heard. Pulses:      Radial pulses are 2+ on the right side, and 2+ on the left side.  I do not detect carotid bruits.  Pulmonary/Chest: Breath sounds normal. She has no wheezes. She has no rales.  Abdominal: Soft. Bowel sounds are normal. There is no tenderness.  Musculoskeletal: Normal range of motion. She exhibits no edema.  Lymphadenopathy:    She has no cervical adenopathy.  Neurological: She is alert and oriented to person, place, and time. She has normal strength. No sensory deficit.  Skin: No lesion and no rash noted.  Psychiatric: She has a normal mood and affect.   Data: I have independently interpreted her vein mapping today. The forearm  and upper arm cephalic vein on the right are somewhat small but potentially usable. The basilic vein appears reasonable in size on the right. On the left side the forearm and upper arm cephalic vein appear potentially usable for a fistula. The basilic vein also looks reasonable.  I have also independently interpreted her arterial Doppler study which shows triphasic Doppler signals in the radial and ulnar positions bilaterally.      Assessment/Plan:    CKD (chronic kidney disease) stage 4, GFR 15-29 ml/min The patient appears to be a reasonable candidate for a fistula in the left arm given that she is right-handed. Of note she does have some rotator cuff issues on the left social may want to consider having this done in the right arm which I think is reasonable. Since she is not on dialysis I have explained that we typically not place an AV graft at this time. I have explained the indications for placement of an AV fistula. I've explained that if at all possible we will place an AV fistula.  I have reviewed the risks of placement of an AV fistula including but not limited to: failure of the fistula to mature, need for subsequent interventions, and thrombosis. All the patient's questions were answered and they are agreeable to proceed with surgery. This  has been scheduled for 07/10/2014.     Angelia Mould MD [facility]

## 2014-07-10 NOTE — Anesthesia Procedure Notes (Signed)
Procedure Name: MAC Date/Time: 07/10/2014 10:43 AM Performed by: Garrison Columbus T Pre-anesthesia Checklist: Patient identified, Emergency Drugs available, Suction available and Patient being monitored Patient Re-evaluated:Patient Re-evaluated prior to inductionOxygen Delivery Method: Simple face mask Preoxygenation: Pre-oxygenation with 100% oxygen Intubation Type: IV induction Placement Confirmation: positive ETCO2 and breath sounds checked- equal and bilateral Dental Injury: Teeth and Oropharynx as per pre-operative assessment

## 2014-07-10 NOTE — Op Note (Signed)
    NAME: Destiny Day   MRN: [id number] DOB: 1950/09/06    DATE OF OPERATION: 07/10/2014  PREOP DIAGNOSIS: Stage IV chronic kidney disease  POSTOP DIAGNOSIS: Same  PROCEDURE:  1. Exploration of left upper arm cephalic vein 2. Left basilic vein transposition  SURGEON: Judeth Cornfield. Scot Dock, MD, FACS  ASSIST: Gerri Lins PA  ANESTHESIA: Local with sedation   EBL: minimal  INDICATIONS: Destiny Day is a 63 y.o. female who is not yet on dialysis. She presents for hemodialysis access.  FINDINGS: The left upper arm cephalic vein was small. When I opened that it was significantly sclerotic and not usable. I therefore placed a left basilic vein transposition.  TECHNIQUE: The patient was taken to the operating room and sedated by anesthesia. The left upper extremity was prepped and draped in the usual sterile fashion. With L sound scanner looked like the upper arm cephalic vein may potentially be usable. After the skin was anesthetized and a transverse incision was made just above the antecubital level. Here the cephalic vein was dissected free. It was ligated distally. I tempted to irrigate this up to distended however the vein was sclerotic and was not usable. It was therefore ligated. The brachial artery was dissected free beneath the fascia through this incision.  Next using 2 longitudinal incisions along the medial aspect of the left upper arm, the basilic vein was harvested to mid upper arm where it entered the brachial vein. Branches were divided between clips and 3-0 silk ties. Where it entered the brachial vein I then continued the dissection up the brachial vein to allow adequate length for a transposition. Branches were divided between clips and 3-0 silk ties. The vein was then distended to prevent twisting. It was then brought through a tunnel created from the antecubital incision up towards the axilla. The patient was then heparinized. The brachial artery was clamped  proximally and distally and a longitudinal arteriotomy was made. The vein was spatulated and sewn end-to-side to the brachial artery using continuous 6-0 Prolene suture. At the completion was a good thrill in the fistula and a good radial and ulnar signal with the Doppler. The heparin was partially reversed with protamine. The vein harvest sites were irrigated and hemostasis obtained. They were each closed with a deep layer of 3-0 Vicryl and the skin closed with 4-0 Vicryl. Incision at the antecubital level was irrigated and hemostasis obtained this wound was closed with a clear 3-0 Vicryl and the skin closed with 4-0 Vicryl. Dermabond was applied. The patient tolerated the procedure well and was transferred to the recovery room in stable condition. All needle and sponge counts were correct.  [name], MD, FACS [facility]  DATE OF DICTATION:   07/10/2014

## 2014-07-10 NOTE — Interval H&P Note (Signed)
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 After consideration of risks, benefits and other options for treatment, the patient has consented to  Procedure(s): ARTERIOVENOUS (AV) FISTULA CREATION - LEFT (Left) as a surgical intervention .  The patient's history has been reviewed, patient examined, no change in status, stable for surgery.  I have reviewed the patient's chart and labs.  Questions were answered to the patient's satisfaction.     DICKSON,CHRISTOPHER S

## 2014-07-10 NOTE — Anesthesia Preprocedure Evaluation (Addendum)
Anesthesia Evaluation  Patient identified by MRN, date of birth, ID band Patient awake    Reviewed: Allergy & Precautions, NPO status , Patient's Chart, lab work & pertinent test results, reviewed documented beta blocker date and time   Airway Mallampati: II  TM Distance: >3 FB Neck ROM: Full    Dental  (+) Edentulous Upper, Poor Dentition, Dental Advisory Given   Pulmonary asthma , sleep apnea , former smoker,    Pulmonary exam normal       Cardiovascular hypertension, Pt. on home beta blockers and Pt. on medications +CHF     Neuro/Psych PSYCHIATRIC DISORDERS Anxiety negative neurological ROS     GI/Hepatic Neg liver ROS, GERD-  Medicated,  Endo/Other  diabetes, Insulin Dependent  Renal/GU ESRFRenal disease     Musculoskeletal   Abdominal   Peds  Hematology   Anesthesia Other Findings   Reproductive/Obstetrics                            Anesthesia Physical Anesthesia Plan  ASA: III  Anesthesia Plan: MAC   Post-op Pain Management:    Induction: Intravenous  Airway Management Planned: Simple Face Mask  Additional Equipment:   Intra-op Plan:   Post-operative Plan: Extubation in OR  Informed Consent: I have reviewed the patients History and Physical, chart, labs and discussed the procedure including the risks, benefits and alternatives for the proposed anesthesia with the patient or authorized representative who has indicated his/her understanding and acceptance.   Dental advisory given  Plan Discussed with: CRNA, Anesthesiologist and Surgeon  Anesthesia Plan Comments:         Anesthesia Quick Evaluation

## 2014-07-10 NOTE — Discharge Instructions (Signed)
What to eat: ° °For your first meals, you should eat lightly; only small meals initially.  If you do not have nausea, you may eat larger meals.  Avoid spicy, greasy and heavy food.   ° °General Anesthesia, Adult, Care After  °Refer to this sheet in the next few weeks. These instructions provide you with information on caring for yourself after your procedure. Your health care provider may also give you more specific instructions. Your treatment has been planned according to current medical practices, but problems sometimes occur. Call your health care provider if you have any problems or questions after your procedure.  °WHAT TO EXPECT AFTER THE PROCEDURE  °After the procedure, it is typical to experience:  °Sleepiness.  °Nausea and vomiting. °HOME CARE INSTRUCTIONS  °For the first 24 hours after general anesthesia:  °Have a responsible person with you.  °Do not drive a car. If you are alone, do not take public transportation.  °Do not drink alcohol.  °Do not take medicine that has not been prescribed by your health care provider.  °Do not sign important papers or make important decisions.  °You may resume a normal diet and activities as directed by your health care provider.  °Change bandages (dressings) as directed.  °If you have questions or problems that seem related to general anesthesia, call the hospital and ask for the anesthetist or anesthesiologist on call. °SEEK MEDICAL CARE IF:  °You have nausea and vomiting that continue the day after anesthesia.  °You develop a rash. °SEEK IMMEDIATE MEDICAL CARE IF:  °You have difficulty breathing.  °You have chest pain.  °You have any allergic problems. °Document Released: 10/19/2000 Document Revised: 03/15/2013 Document Reviewed: 01/26/2013  °ExitCare® Patient Information ©2014 [employer].  ° ° °

## 2014-07-11 ENCOUNTER — Encounter (HOSPITAL_COMMUNITY): Payer: Self-pay | Admitting: Vascular Surgery

## 2014-07-12 ENCOUNTER — Telehealth: Payer: Self-pay | Admitting: Vascular Surgery

## 2014-07-12 NOTE — Telephone Encounter (Addendum)
-----   Message from Mena Goes, RN sent at 07/10/2014  2:07 PM EST ----- Regarding: Schedule   ----- Message -----    From: Angelia Mould, MD    Sent: 07/10/2014   1:36 PM      To: Vvs Charge Pool Subject: charge and f/u                                 PROCEDURE:  1. Exploration of left upper arm cephalic vein 2. Left basilic vein transposition  SURGEON: Judeth Cornfield. Scot Dock, MD, FACS  ASSIST: Gerri Lins PA  She will need a follow up visit in 6 weeks to check on the maturation of her fistula. She will need a duplex at that time. Thank you. CD  07/12/14: spoke with pt, dpm

## 2014-07-30 ENCOUNTER — Emergency Department (HOSPITAL_COMMUNITY): Payer: BC Managed Care – PPO

## 2014-07-30 ENCOUNTER — Inpatient Hospital Stay (HOSPITAL_COMMUNITY)
Admission: EM | Admit: 2014-07-30 | Discharge: 2014-08-01 | DRG: 291 | Disposition: A | Discharge status: Home / Self Care | Payer: BC Managed Care – PPO | Attending: Internal Medicine | Admitting: Internal Medicine

## 2014-07-30 DIAGNOSIS — G4730 Sleep apnea, unspecified: Secondary | ICD-10-CM | POA: Diagnosis present

## 2014-07-30 DIAGNOSIS — Z8701 Personal history of pneumonia (recurrent): Secondary | ICD-10-CM | POA: Diagnosis not present

## 2014-07-30 DIAGNOSIS — E1122 Type 2 diabetes mellitus with diabetic chronic kidney disease: Secondary | ICD-10-CM | POA: Diagnosis present

## 2014-07-30 DIAGNOSIS — Z888 Allergy status to other drugs, medicaments and biological substances status: Secondary | ICD-10-CM | POA: Diagnosis not present

## 2014-07-30 DIAGNOSIS — Z87891 Personal history of nicotine dependence: Secondary | ICD-10-CM | POA: Diagnosis not present

## 2014-07-30 DIAGNOSIS — Z794 Long term (current) use of insulin: Secondary | ICD-10-CM | POA: Diagnosis not present

## 2014-07-30 DIAGNOSIS — Z9114 Patient's other noncompliance with medication regimen: Secondary | ICD-10-CM | POA: Diagnosis present

## 2014-07-30 DIAGNOSIS — E1165 Type 2 diabetes mellitus with hyperglycemia: Secondary | ICD-10-CM | POA: Diagnosis present

## 2014-07-30 DIAGNOSIS — T502X5A Adverse effect of carbonic-anhydrase inhibitors, benzothiadiazides and other diuretics, initial encounter: Secondary | ICD-10-CM | POA: Diagnosis present

## 2014-07-30 DIAGNOSIS — D649 Anemia, unspecified: Secondary | ICD-10-CM | POA: Diagnosis present

## 2014-07-30 DIAGNOSIS — N184 Chronic kidney disease, stage 4 (severe): Secondary | ICD-10-CM | POA: Diagnosis present

## 2014-07-30 DIAGNOSIS — I110 Hypertensive heart disease with heart failure: Secondary | ICD-10-CM

## 2014-07-30 DIAGNOSIS — F419 Anxiety disorder, unspecified: Secondary | ICD-10-CM | POA: Diagnosis present

## 2014-07-30 DIAGNOSIS — N179 Acute kidney failure, unspecified: Secondary | ICD-10-CM | POA: Diagnosis present

## 2014-07-30 DIAGNOSIS — R0602 Shortness of breath: Secondary | ICD-10-CM | POA: Diagnosis not present

## 2014-07-30 DIAGNOSIS — I509 Heart failure, unspecified: Secondary | ICD-10-CM

## 2014-07-30 DIAGNOSIS — I5033 Acute on chronic diastolic (congestive) heart failure: Secondary | ICD-10-CM | POA: Diagnosis present

## 2014-07-30 DIAGNOSIS — I130 Hypertensive heart and chronic kidney disease with heart failure and stage 1 through stage 4 chronic kidney disease, or unspecified chronic kidney disease: Secondary | ICD-10-CM | POA: Diagnosis not present

## 2014-07-30 DIAGNOSIS — J45909 Unspecified asthma, uncomplicated: Secondary | ICD-10-CM | POA: Diagnosis present

## 2014-07-30 DIAGNOSIS — E1140 Type 2 diabetes mellitus with diabetic neuropathy, unspecified: Secondary | ICD-10-CM | POA: Diagnosis present

## 2014-07-30 DIAGNOSIS — Z90710 Acquired absence of both cervix and uterus: Secondary | ICD-10-CM | POA: Diagnosis not present

## 2014-07-30 DIAGNOSIS — M1990 Unspecified osteoarthritis, unspecified site: Secondary | ICD-10-CM | POA: Diagnosis present

## 2014-07-30 DIAGNOSIS — E785 Hyperlipidemia, unspecified: Secondary | ICD-10-CM | POA: Diagnosis present

## 2014-07-30 DIAGNOSIS — I5030 Unspecified diastolic (congestive) heart failure: Secondary | ICD-10-CM

## 2014-07-30 DIAGNOSIS — M810 Age-related osteoporosis without current pathological fracture: Secondary | ICD-10-CM | POA: Diagnosis present

## 2014-07-30 DIAGNOSIS — K219 Gastro-esophageal reflux disease without esophagitis: Secondary | ICD-10-CM | POA: Diagnosis present

## 2014-07-30 LAB — URINE MICROSCOPIC-ADD ON

## 2014-07-30 LAB — CBC WITH DIFFERENTIAL/PLATELET
Basophils Absolute: 0 10*3/uL (ref 0.0–0.1)
Basophils Relative: 1 % (ref 0–1)
Eosinophils Absolute: 0.2 10*3/uL (ref 0.0–0.7)
Eosinophils Relative: 4 % (ref 0–5)
HCT: 31.7 % — ABNORMAL LOW (ref 36.0–46.0)
Hemoglobin: 10.1 g/dL — ABNORMAL LOW (ref 12.0–15.0)
Lymphocytes Relative: 38 % (ref 12–46)
Lymphs Abs: 2.2 10*3/uL (ref 0.7–4.0)
MCH: 27.2 pg (ref 26.0–34.0)
MCHC: 31.9 g/dL (ref 30.0–36.0)
MCV: 85.4 fL (ref 78.0–100.0)
Monocytes Absolute: 0.4 10*3/uL (ref 0.1–1.0)
Monocytes Relative: 8 % (ref 3–12)
Neutro Abs: 2.8 10*3/uL (ref 1.7–7.7)
Neutrophils Relative %: 49 % (ref 43–77)
Platelets: 364 10*3/uL (ref 150–400)
RBC: 3.71 MIL/uL — ABNORMAL LOW (ref 3.87–5.11)
RDW: 12.8 % (ref 11.5–15.5)
WBC: 5.7 10*3/uL (ref 4.0–10.5)

## 2014-07-30 LAB — URINALYSIS, ROUTINE W REFLEX MICROSCOPIC
Bilirubin Urine: NEGATIVE
Glucose, UA: 250 mg/dL — AB
Ketones, ur: NEGATIVE mg/dL
Leukocytes, UA: NEGATIVE
Nitrite: NEGATIVE
Protein, ur: >300 mg/dL — AB
Specific Gravity, Urine: 1.012 (ref 1.005–1.030)
Urobilinogen, UA: 0.2 mg/dL (ref 0.0–1.0)
pH: 6.5 (ref 5.0–8.0)

## 2014-07-30 LAB — COMPREHENSIVE METABOLIC PANEL
ALT: 17 U/L (ref 0–35)
AST: 26 U/L (ref 0–37)
Albumin: 3.3 g/dL — ABNORMAL LOW (ref 3.5–5.2)
Alkaline Phosphatase: 120 U/L — ABNORMAL HIGH (ref 39–117)
Anion gap: 8 (ref 5–15)
BUN: 34 mg/dL — ABNORMAL HIGH (ref 6–23)
CO2: 29 mmol/L (ref 19–32)
Calcium: 8.9 mg/dL (ref 8.4–10.5)
Chloride: 99 mEq/L (ref 96–112)
Creatinine, Ser: 2.89 mg/dL — ABNORMAL HIGH (ref 0.50–1.10)
GFR calc Af Amer: 19 mL/min — ABNORMAL LOW (ref >90)
GFR calc non Af Amer: 16 mL/min — ABNORMAL LOW (ref >90)
Glucose, Bld: 225 mg/dL — ABNORMAL HIGH (ref 70–99)
Potassium: 3.2 mmol/L — ABNORMAL LOW (ref 3.5–5.1)
Sodium: 136 mmol/L (ref 135–145)
Total Bilirubin: 0.2 mg/dL — ABNORMAL LOW (ref 0.3–1.2)
Total Protein: 7.3 g/dL (ref 6.0–8.3)

## 2014-07-30 LAB — CBG MONITORING, ED: Glucose-Capillary: 222 mg/dL — ABNORMAL HIGH (ref 70–99)

## 2014-07-30 LAB — I-STAT TROPONIN, ED: Troponin i, poc: 0.02 ng/mL (ref 0.00–0.08)

## 2014-07-30 LAB — LIPASE, BLOOD: Lipase: 27 U/L (ref 11–59)

## 2014-07-30 LAB — BRAIN NATRIURETIC PEPTIDE: B Natriuretic Peptide: 1049.7 pg/mL — ABNORMAL HIGH (ref 0.0–100.0)

## 2014-07-30 MED ORDER — ONDANSETRON HCL 4 MG/2ML IJ SOLN
4.0000 mg | Freq: Once | INTRAMUSCULAR | Status: AC
  Administered 2014-07-30: 4 mg via INTRAVENOUS

## 2014-07-30 MED ORDER — HYDRALAZINE HCL 20 MG/ML IJ SOLN
10.0000 mg | INTRAMUSCULAR | Status: DC | PRN
  Administered 2014-07-30: 10 mg via INTRAVENOUS
  Filled 2014-07-30: qty 1

## 2014-07-30 MED ORDER — METOLAZONE 5 MG PO TABS
5.0000 mg | ORAL_TABLET | Freq: Once | ORAL | Status: AC
  Administered 2014-07-30: 5 mg via ORAL
  Filled 2014-07-30: qty 1

## 2014-07-30 MED ORDER — NITROGLYCERIN IN D5W 200-5 MCG/ML-% IV SOLN
100.0000 ug/min | Freq: Once | INTRAVENOUS | Status: AC
  Administered 2014-07-30: 100 ug/min via INTRAVENOUS
  Filled 2014-07-30: qty 250

## 2014-07-30 MED ORDER — FUROSEMIDE 10 MG/ML IJ SOLN
60.0000 mg | Freq: Once | INTRAMUSCULAR | Status: AC
  Administered 2014-07-30: 60 mg via INTRAVENOUS
  Filled 2014-07-30: qty 6

## 2014-07-30 MED ORDER — ONDANSETRON HCL 4 MG/2ML IJ SOLN
INTRAMUSCULAR | Status: AC
  Administered 2014-07-30: 4 mg via INTRAVENOUS
  Filled 2014-07-30: qty 2

## 2014-07-30 NOTE — ED Provider Notes (Signed)
Complains of shortness of breath and nonproductive cough typical of her CHF onset approximate 4 PM today denies pain anywhere. On exam speaks in paragraphs no respiratory distress while on oxygen. HEENT exam no facial asymmetry neck supple positive JVD lungs Rales at bases bilaterally extremities without edema  Orlie Dakin, MD 07/30/14 2049

## 2014-07-30 NOTE — ED Notes (Signed)
States I have CHF and Ive been SOB today.  Actively vomiting in triage.  Sats in 80's on RA.

## 2014-07-30 NOTE — H&P (Signed)
Destiny Day is an 64 y.o. female.   Chief Complaint: sob HPI: [name] is a pleasant female with multiple medical problems.  She has poorly controlled diabetes with the last A1C at 9.7% 2 mos. Ago. She has CKD.  A fistula was placed in her left arm two weeks ago in anticipation of going on hemodialysis at some point. She was in her usual state of health until today. She does report eating more salty foods lately.  She developed acute onset Of sob, cough and n/v (she states that N/V is one her symptoms with chf).  She presented to the ER. BP was very high at 200/100. She was given LASIX and ntg gtt and feels a bit better. She will be admitted.  Past Medical History  Diagnosis Date  . Hypertension   . Diverticulitis   . Hyperlipidemia   . Asthma   . Diabetes mellitus     insulin dependent  . Arthritis     knee  . GI bleed 03/31/2013  . CHF (congestive heart failure)   . Osteoporosis   . Seasonal allergies   . Anxiety   . Abdominal bruit   . Chronic kidney disease     stage 3  . Sleep apnea     can't afford cpap  . Pneumonia   . GERD (gastroesophageal reflux disease)     from medications    Past Surgical History  Procedure Laterality Date  . Abdominal hysterectomy  1993`  . Esophagogastroduodenoscopy N/A 03/31/2013    Procedure: ESOPHAGOGASTRODUODENOSCOPY (EGD);  Surgeon: Gatha Mayer, MD;  Location: [facility] ENDOSCOPY;  Service: Endoscopy;  Laterality: N/A;  . Eye surgery      laser surgery  . Bascilic vein transposition Left 07/10/2014    Procedure: BASCILIC VEIN TRANSPOSITION;  Surgeon: Angelia Mould, MD;  Location: [facility] OR;  Service: Vascular;  Laterality: Left;    Family History  Problem Relation Age of Onset  . Colon cancer Neg Hx   . Other Mother     not sure of cause of death  . Diabetes Father   . Pancreatic cancer Maternal Grandmother    Social History:  reports that she quit smoking about 2 years ago. Her smoking use included Cigarettes. She has a  14 pack-year smoking history. She has never used smokeless tobacco. She reports that she uses illicit drugs. She reports that she does not drink alcohol.  Husband's name is Destiny Day and he is with her.  She has 4 children and 9 GC and 2 GGC.  Allergies:  Allergies  Allergen Reactions  . Lisinopril Cough  . Prednisone Other (See Comments)    Excessive fluid buildup     See full med rec for home med list.  Results for orders placed or performed during the hospital encounter of 07/30/14 (from the past 48 hour(s))  CBC with Differential     Status: Abnormal   Collection Time: 07/30/14  7:35 PM  Result Value Ref Range   WBC 5.7 4.0 - 10.5 K/uL   RBC 3.71 (L) 3.87 - 5.11 MIL/uL   Hemoglobin 10.1 (L) 12.0 - 15.0 g/dL   HCT 31.7 (L) 36.0 - 46.0 %   MCV 85.4 78.0 - 100.0 fL   MCH 27.2 26.0 - 34.0 pg   MCHC 31.9 30.0 - 36.0 g/dL   RDW 12.8 11.5 - 15.5 %   Platelets 364 150 - 400 K/uL   Neutrophils Relative % 49 43 - 77 %   Neutro Abs 2.8  1.7 - 7.7 K/uL   Lymphocytes Relative 38 12 - 46 %   Lymphs Abs 2.2 0.7 - 4.0 K/uL   Monocytes Relative 8 3 - 12 %   Monocytes Absolute 0.4 0.1 - 1.0 K/uL   Eosinophils Relative 4 0 - 5 %   Eosinophils Absolute 0.2 0.0 - 0.7 K/uL   Basophils Relative 1 0 - 1 %   Basophils Absolute 0.0 0.0 - 0.1 K/uL  Comprehensive metabolic panel     Status: Abnormal   Collection Time: 07/30/14  7:35 PM  Result Value Ref Range   Sodium 136 135 - 145 mmol/L    Comment: Please note change in reference range.   Potassium 3.2 (L) 3.5 - 5.1 mmol/L    Comment: Please note change in reference range.   Chloride 99 96 - 112 mEq/L   CO2 29 19 - 32 mmol/L   Glucose, Bld 225 (H) 70 - 99 mg/dL   BUN 34 (H) 6 - 23 mg/dL   Creatinine, Ser 2.89 (H) 0.50 - 1.10 mg/dL   Calcium 8.9 8.4 - 10.5 mg/dL   Total Protein 7.3 6.0 - 8.3 g/dL   Albumin 3.3 (L) 3.5 - 5.2 g/dL   AST 26 0 - 37 U/L   ALT 17 0 - 35 U/L   Alkaline Phosphatase 120 (H) 39 - 117 U/L   Total Bilirubin 0.2 (L)  0.3 - 1.2 mg/dL   GFR calc non Af Amer 16 (L) >90 mL/min   GFR calc Af Amer 19 (L) >90 mL/min    Comment: (NOTE) The eGFR has been calculated using the CKD EPI equation. This calculation has not been validated in all clinical situations. eGFR's persistently <90 mL/min signify possible Chronic Kidney Disease.    Anion gap 8 5 - 15  Lipase, blood     Status: None   Collection Time: 07/30/14  7:35 PM  Result Value Ref Range   Lipase 27 11 - 59 U/L  I-stat troponin, ED (only if pt is 64 y.o. or older & pain is above umbilicus) - do not order at [facility]     Status: None   Collection Time: 07/30/14  7:43 PM  Result Value Ref Range   Troponin i, poc 0.02 0.00 - 0.08 ng/mL   Comment 3            Comment: Due to the release kinetics of cTnI, a negative result within the first hours of the onset of symptoms does not rule out myocardial infarction with certainty. If myocardial infarction is still suspected, repeat the test at appropriate intervals.   Urinalysis, Routine w reflex microscopic     Status: Abnormal   Collection Time: 07/30/14  8:00 PM  Result Value Ref Range   Color, Urine YELLOW YELLOW   APPearance HAZY (A) CLEAR   Specific Gravity, Urine 1.012 1.005 - 1.030   pH 6.5 5.0 - 8.0   Glucose, UA 250 (A) NEGATIVE mg/dL   Hgb urine dipstick TRACE (A) NEGATIVE   Bilirubin Urine NEGATIVE NEGATIVE   Ketones, ur NEGATIVE NEGATIVE mg/dL   Protein, ur >300 (A) NEGATIVE mg/dL   Urobilinogen, UA 0.2 0.0 - 1.0 mg/dL   Nitrite NEGATIVE NEGATIVE   Leukocytes, UA NEGATIVE NEGATIVE  Urine microscopic-add on     Status: Abnormal   Collection Time: 07/30/14  8:00 PM  Result Value Ref Range   Squamous Epithelial / LPF MANY (A) RARE   WBC, UA 0-2 <3 WBC/hpf   RBC / HPF  0-2 <3 RBC/hpf   Bacteria, UA RARE RARE   Casts HYALINE CASTS (A) NEGATIVE   Dg Chest Portable 1 View  07/30/2014   CLINICAL DATA:  Shortness of breath today with vomiting.  EXAM: PORTABLE CHEST - 1 VIEW  COMPARISON:   05/15/2014.  FINDINGS: Mildly enlarged cardiac silhouette with a mild increase in size. Diffuse increase in prominence of the pulmonary vasculature and interstitial markings, including Kerley lines. Probable small bilateral pleural effusions. Diffuse osteopenia. Bilateral shoulder degenerative changes.  IMPRESSION: Interval cardiomegaly and changes of acute congestive heart failure.   Electronically Signed   By: Enrique Sack M.D.   On: 07/30/2014 19:42    ROS:as per hpi.  She says weight has been stable. She states her home bp machines are not working so not monitoring at home.  Blood pressure 191/80, pulse 92, resp. rate 22, SpO2 97 %.  she appears weak. semi-supine in nad.  she has jvd. she has coarse breath sounds bilaterally.  heart is rrr with no m/r/g. abd is soft, nt, nd. no mass or hsm. there is no edema and she has grossly nL strength throughout.  Assessment/Plan 64 yo female with acute on chronic congestive heart failure. We will admit. We will diurese with lasix IV.  We will ask for cardiology consultation (she states Dr. Einar Gip is her heart doctor). For her ckd we will follow bun/cr closely.  She is a full code status.  Jerlyn Ly, MD 07/30/2014, 10:22 PM

## 2014-07-30 NOTE — ED Provider Notes (Signed)
CSN: [id number]     Arrival date & time 07/30/14  1909 History   First MD Initiated Contact with Patient 07/30/14 1945     Chief Complaint  Patient presents with  . Shortness of Breath  . Nausea     (Consider location/radiation/quality/duration/timing/severity/associated sxs/prior Treatment) Patient is a 64 y.o. female presenting with shortness of breath.  Shortness of Breath Severity:  Severe Onset quality:  Gradual Duration:  3 hours Timing:  Constant Progression:  Worsening Chronicity:  New Relieved by:  Sitting up Exacerbated by: laying down. Ineffective treatments:  Diuretics and rest Associated symptoms: PND and vomiting   Associated symptoms: no abdominal pain, no chest pain, no cough, no fever, no headaches, no neck pain, no rash, no sore throat and no wheezing   Risk factors: no hx of PE/DVT and no prolonged immobilization     Past Medical History  Diagnosis Date  . Hypertension   . Diverticulitis   . Hyperlipidemia   . Asthma   . Diabetes mellitus     insulin dependent  . Arthritis     knee  . GI bleed 03/31/2013  . CHF (congestive heart failure)   . Osteoporosis   . Seasonal allergies   . Anxiety   . Abdominal bruit   . Chronic kidney disease     stage 3  . Sleep apnea     can't afford cpap  . Pneumonia   . GERD (gastroesophageal reflux disease)     from medications   Past Surgical History  Procedure Laterality Date  . Abdominal hysterectomy  1993`  . Esophagogastroduodenoscopy N/A 03/31/2013    Procedure: ESOPHAGOGASTRODUODENOSCOPY (EGD);  Surgeon: Gatha Mayer, MD;  Location: [facility] ENDOSCOPY;  Service: Endoscopy;  Laterality: N/A;  . Eye surgery      laser surgery  . Bascilic vein transposition Left 07/10/2014    Procedure: BASCILIC VEIN TRANSPOSITION;  Surgeon: Angelia Mould, MD;  Location: [facility] OR;  Service: Vascular;  Laterality: Left;   Family History  Problem Relation Age of Onset  . Colon cancer Neg Hx   . Other Mother     not sure  of cause of death  . Diabetes Father   . Pancreatic cancer Maternal Grandmother    History  Substance Use Topics  . Smoking status: Former Smoker -- 0.35 packs/day for 40 years    Types: Cigarettes    Quit date: 05/10/2012  . Smokeless tobacco: Never Used  . Alcohol Use: No   OB History    No data available     Review of Systems  Constitutional: Positive for activity change, appetite change and fatigue. Negative for fever.  HENT: Negative for sore throat.   Eyes: Negative for visual disturbance.  Respiratory: Positive for shortness of breath. Negative for cough and wheezing.   Cardiovascular: Positive for PND. Negative for chest pain.  Gastrointestinal: Positive for nausea and vomiting. Negative for abdominal pain, diarrhea and constipation.  Genitourinary: Negative for difficulty urinating.  Musculoskeletal: Negative for back pain and neck pain.  Skin: Negative for rash.  Neurological: Negative for syncope and headaches.      Allergies  Lisinopril and Prednisone  Home Medications   Prior to Admission medications   Medication Sig Start Date End Date Taking? Authorizing Provider  albuterol (PROVENTIL HFA;VENTOLIN HFA) 108 (90 BASE) MCG/ACT inhaler Inhale 2 puffs into the lungs every 6 (six) hours as needed for wheezing.   Yes Historical Provider, MD  amLODipine (NORVASC) 10 MG tablet Take 1 tablet (10  mg total) by mouth daily. 07/23/13  Yes Velvet Bathe, MD  aspirin 81 MG chewable tablet Chew 81 mg by mouth daily.   Yes Historical Provider, MD  atorvastatin (LIPITOR) 20 MG tablet Take 20 mg by mouth daily at 6 PM.   Yes Historical Provider, MD  budesonide-formoterol (SYMBICORT) 160-4.5 MCG/ACT inhaler Inhale 1 puff into the lungs every evening.   Yes Historical Provider, MD  calcitRIOL (ROCALTROL) 0.25 MCG capsule Take 0.25 mcg by mouth daily.   Yes Historical Provider, MD  Calcium Carbonate-Vit D-Min (CALCIUM 600+D PLUS MINERALS) 600-400 MG-UNIT TABS Take 1 tablet by mouth  daily.   Yes Historical Provider, MD  escitalopram (LEXAPRO) 10 MG tablet Take 10 mg by mouth daily as needed (ANXIETY).   Yes Historical Provider, MD  famotidine (PEPCID) 20 MG tablet Take 20 mg by mouth daily.   Yes Historical Provider, MD  furosemide (LASIX) 40 MG tablet Take 1.5 tablets (60 mg total) by mouth 2 (two) times daily. Patient taking differently: Take 80 mg by mouth 2 (two) times daily.  01/12/14  Yes Barton Dubois, MD  insulin aspart (NOVOLOG) 100 UNIT/ML injection Inject 0-8 Units into the skin 3 (three) times daily before meals. Sliding scale   Yes Historical Provider, MD  insulin detemir (LEVEMIR) 100 UNIT/ML injection Inject 10 Units into the skin at bedtime.   Yes Historical Provider, MD  irbesartan (AVAPRO) 150 MG tablet Take 1 tablet (150 mg total) by mouth daily. 01/12/14  Yes Barton Dubois, MD  isosorbide-hydrALAZINE (BIDIL) 20-37.5 MG per tablet Take 2 tablets by mouth 3 (three) times daily. 01/12/14  Yes Barton Dubois, MD  loratadine (CLARITIN) 10 MG tablet Take 10 mg by mouth daily as needed for allergies.   Yes Historical Provider, MD  metolazone (ZAROXOLYN) 5 MG tablet Take 1 tablet by mouth as needed (urination).  01/17/14  Yes Historical Provider, MD  potassium chloride SA (K-DUR,KLOR-CON) 20 MEQ tablet Take 20 mEq by mouth 2 (two) times daily.   Yes Historical Provider, MD  traMADol (ULTRAM) 50 MG tablet Take 50 mg by mouth every 6 (six) hours as needed (pain).  04/25/14  Yes Historical Provider, MD  Vitamin D, Ergocalciferol, (DRISDOL) 50000 UNITS CAPS capsule Take 50,000 Units by mouth every 7 (seven) days. On wednesday   Yes Historical Provider, MD  carvedilol (COREG) 25 MG tablet Take 1 tablet (25 mg total) by mouth 2 (two) times daily with a meal. Patient not taking: Reported on 07/09/2014 06/01/13   Costin Karlyne Greenspan, MD  cyclobenzaprine (FLEXERIL) 10 MG tablet Take 1 tablet (10 mg total) by mouth 2 (two) times daily as needed for muscle spasms. Patient not taking:  Reported on 07/30/2014 05/11/14   Gay Filler Copland, MD  diazepam (VALIUM) 5 MG tablet Take 1 tablet (5 mg total) by mouth every 8 (eight) hours as needed for anxiety (spasms). Patient not taking: Reported on 07/30/2014 05/15/14   Jarrett Soho Muthersbaugh, PA-C  ondansetron (ZOFRAN ODT) 4 MG disintegrating tablet 4mg  ODT q4 hours prn nausea/vomit Patient not taking: Reported on 07/09/2014 05/01/14   Pamella Pert, MD  ondansetron (ZOFRAN ODT) 4 MG disintegrating tablet 4mg  ODT q4 hours prn nausea/vomit Patient not taking: Reported on 07/09/2014 05/15/14   Jarrett Soho Muthersbaugh, PA-C  oxyCODONE-acetaminophen (PERCOCET) 5-325 MG per tablet Take 1-2 tablets by mouth every 6 (six) hours as needed for moderate pain. Patient not taking: Reported on 07/09/2014 05/01/14   Pamella Pert, MD  oxyCODONE-acetaminophen (PERCOCET) 5-325 MG per tablet Take 1-2 tablets by mouth every  4 (four) hours as needed. Patient taking differently: Take 1-2 tablets by mouth every 4 (four) hours as needed (pain).  05/15/14   Hannah Muthersbaugh, PA-C  oxyCODONE-acetaminophen (PERCOCET/ROXICET) 5-325 MG per tablet Take 1 tablet by mouth every 6 (six) hours as needed. Patient not taking: Reported on 07/30/2014 07/10/14   Ulyses Amor, PA-C   BP 153/59 mmHg  Pulse 86  Temp(Src) 98.7 F (37.1 C) (Oral)  Resp 19  SpO2 100% Physical Exam  Constitutional: She is oriented to person, place, and time. She appears well-developed and well-nourished. No distress.  HENT:  Head: Normocephalic and atraumatic.  Eyes: Conjunctivae and EOM are normal.  Neck: Normal range of motion. JVD present.  Cardiovascular: Normal rate, regular rhythm, normal heart sounds and intact distal pulses.  Exam reveals no gallop and no friction rub.   No murmur heard. Pulmonary/Chest: Effort normal. No respiratory distress. She has decreased breath sounds in the right lower field and the left lower field. She has no wheezes. She has no rales.  Abdominal: Soft.  She exhibits no distension. There is no tenderness. There is no guarding.  Musculoskeletal: She exhibits no edema or tenderness.  Neurological: She is alert and oriented to person, place, and time.  Skin: Skin is warm and dry. No rash noted. She is not diaphoretic. No erythema.  Nursing note and vitals reviewed.   ED Course  Procedures (including critical care time) Labs Review Labs Reviewed  CBC WITH DIFFERENTIAL - Abnormal; Notable for the following:    RBC 3.71 (*)    Hemoglobin 10.1 (*)    HCT 31.7 (*)    All other components within normal limits  COMPREHENSIVE METABOLIC PANEL - Abnormal; Notable for the following:    Potassium 3.2 (*)    Glucose, Bld 225 (*)    BUN 34 (*)    Creatinine, Ser 2.89 (*)    Albumin 3.3 (*)    Alkaline Phosphatase 120 (*)    Total Bilirubin 0.2 (*)    GFR calc non Af Amer 16 (*)    GFR calc Af Amer 19 (*)    All other components within normal limits  URINALYSIS, ROUTINE W REFLEX MICROSCOPIC - Abnormal; Notable for the following:    APPearance HAZY (*)    Glucose, UA 250 (*)    Hgb urine dipstick TRACE (*)    Protein, ur >300 (*)    All other components within normal limits  BRAIN NATRIURETIC PEPTIDE - Abnormal; Notable for the following:    B Natriuretic Peptide 1049.7 (*)    All other components within normal limits  URINE MICROSCOPIC-ADD ON - Abnormal; Notable for the following:    Squamous Epithelial / LPF MANY (*)    Casts HYALINE CASTS (*)    All other components within normal limits  COMPREHENSIVE METABOLIC PANEL - Abnormal; Notable for the following:    Sodium 134 (*)    Potassium 3.2 (*)    Glucose, Bld 212 (*)    BUN 35 (*)    Creatinine, Ser 3.02 (*)    Albumin 2.9 (*)    GFR calc non Af Amer 15 (*)    GFR calc Af Amer 18 (*)    All other components within normal limits  CBC WITH DIFFERENTIAL - Abnormal; Notable for the following:    RBC 3.05 (*)    Hemoglobin 8.4 (*)    HCT 25.8 (*)    All other components within normal  limits  CBG MONITORING, ED - Abnormal;  Notable for the following:    Glucose-Capillary 222 (*)    All other components within normal limits  LIPASE, BLOOD  MAGNESIUM  PHOSPHORUS  HEMOGLOBIN A1C  I-STAT TROPOININ, ED    Imaging Review Dg Chest Portable 1 View  07/30/2014   CLINICAL DATA:  Shortness of breath today with vomiting.  EXAM: PORTABLE CHEST - 1 VIEW  COMPARISON:  05/15/2014.  FINDINGS: Mildly enlarged cardiac silhouette with a mild increase in size. Diffuse increase in prominence of the pulmonary vasculature and interstitial markings, including Kerley lines. Probable small bilateral pleural effusions. Diffuse osteopenia. Bilateral shoulder degenerative changes.  IMPRESSION: Interval cardiomegaly and changes of acute congestive heart failure.   Electronically Signed   By: Enrique Sack M.D.   On: 07/30/2014 19:42     EKG Interpretation   Date/Time:  Monday July 30 2014 19:13:24 EST Ventricular Rate:  118 PR Interval:  148 QRS Duration: 80 QT Interval:  324 QTC Calculation: 454 R Axis:   72 Text Interpretation:  Sinus tachycardia Otherwise normal ECG No  significant change since last tracing Confirmed by Winfred Leeds  MD, SAM  [phone]) on 07/30/2014 7:29:44 PM      MDM   Final diagnoses:  Acute on chronic diastolic congestive heart failure  Hypertensive heart disease with heart failure   64 year old female with a history of chronic diastolic CHF, hypertension, hyperlipidemia, asthma, type 2 diabetes, CKD with recent fistula placement, presents with concern of shortness of breath, nausea and vomiting. She reports this is similar to her past CHF exacerbations. She is hypoxic on room air to the 80s and was placed on oxygen (initially 6L) which was weaned down to 3 L.  Higher suspicion for CHF exacerbation than PE given clinical history and history of same. Her chest x-ray showed signs of CHF exacerbation with cardiomegaly and pulmonary edema.  Her blood pressures were elevated to  that over [id number] systolic and she was placed on a nitro drip with improvement in blood pressures.  Her renal function appears at baseline. She was given 60 mg of IV Lasix. She will be admitted by [facility] for further care.   Alvino Chapel, MD 07/31/14 [id number]  Orlie Dakin, MD 08/01/14 1213

## 2014-07-31 ENCOUNTER — Encounter (HOSPITAL_COMMUNITY): Payer: Self-pay | Admitting: *Deleted

## 2014-07-31 ENCOUNTER — Inpatient Hospital Stay (HOSPITAL_COMMUNITY): Payer: BC Managed Care – PPO

## 2014-07-31 LAB — COMPREHENSIVE METABOLIC PANEL
ALT: 15 U/L (ref 0–35)
AST: 19 U/L (ref 0–37)
Albumin: 2.9 g/dL — ABNORMAL LOW (ref 3.5–5.2)
Alkaline Phosphatase: 96 U/L (ref 39–117)
Anion gap: 7 (ref 5–15)
BUN: 35 mg/dL — ABNORMAL HIGH (ref 6–23)
CO2: 29 mmol/L (ref 19–32)
Calcium: 8.5 mg/dL (ref 8.4–10.5)
Chloride: 98 mEq/L (ref 96–112)
Creatinine, Ser: 3.02 mg/dL — ABNORMAL HIGH (ref 0.50–1.10)
GFR calc Af Amer: 18 mL/min — ABNORMAL LOW (ref >90)
GFR calc non Af Amer: 15 mL/min — ABNORMAL LOW (ref >90)
Glucose, Bld: 212 mg/dL — ABNORMAL HIGH (ref 70–99)
Potassium: 3.2 mmol/L — ABNORMAL LOW (ref 3.5–5.1)
Sodium: 134 mmol/L — ABNORMAL LOW (ref 135–145)
Total Bilirubin: 0.4 mg/dL (ref 0.3–1.2)
Total Protein: 6.3 g/dL (ref 6.0–8.3)

## 2014-07-31 LAB — HEMOGLOBIN A1C
Hgb A1c MFr Bld: 9.2 % — ABNORMAL HIGH (ref <5.7)
Mean Plasma Glucose: 217 mg/dL — ABNORMAL HIGH (ref <117)

## 2014-07-31 LAB — PHOSPHORUS: Phosphorus: 4.1 mg/dL (ref 2.3–4.6)

## 2014-07-31 LAB — GLUCOSE, CAPILLARY: Glucose-Capillary: 183 mg/dL — ABNORMAL HIGH (ref 70–99)

## 2014-07-31 LAB — MRSA PCR SCREENING: MRSA by PCR: NEGATIVE

## 2014-07-31 LAB — TYPE AND SCREEN
ABO/RH(D): B POS
Antibody Screen: NEGATIVE

## 2014-07-31 LAB — MAGNESIUM: Magnesium: 1.6 mg/dL (ref 1.5–2.5)

## 2014-07-31 LAB — TROPONIN I: Troponin I: 0.05 ng/mL — ABNORMAL HIGH (ref <0.031)

## 2014-07-31 LAB — BRAIN NATRIURETIC PEPTIDE: B Natriuretic Peptide: 1110.5 pg/mL — ABNORMAL HIGH (ref 0.0–100.0)

## 2014-07-31 MED ORDER — SODIUM CHLORIDE 0.9 % IJ SOLN: 3.0000 mL | Freq: Two times a day (BID) | INTRAMUSCULAR | Status: DC

## 2014-07-31 MED ORDER — CALCITRIOL 0.25 MCG PO CAPS
0.2500 ug | ORAL_CAPSULE | Freq: Every day | ORAL | Status: DC
  Administered 2014-07-31 – 2014-08-01 (×2): 0.25 ug via ORAL
  Filled 2014-07-31 (×2): qty 1

## 2014-07-31 MED ORDER — IRBESARTAN 150 MG PO TABS
150.0000 mg | ORAL_TABLET | Freq: Every day | ORAL | Status: DC
  Administered 2014-07-31 – 2014-08-01 (×2): 150 mg via ORAL
  Filled 2014-07-31 (×2): qty 1

## 2014-07-31 MED ORDER — FUROSEMIDE 10 MG/ML IJ SOLN
60.0000 mg | Freq: Three times a day (TID) | INTRAMUSCULAR | Status: DC
  Administered 2014-07-31 (×2): 60 mg via INTRAVENOUS
  Filled 2014-07-31 (×4): qty 6

## 2014-07-31 MED ORDER — LORATADINE 10 MG PO TABS
10.0000 mg | ORAL_TABLET | Freq: Every day | ORAL | Status: DC | PRN
  Filled 2014-07-31: qty 1

## 2014-07-31 MED ORDER — ATORVASTATIN CALCIUM 20 MG PO TABS
20.0000 mg | ORAL_TABLET | Freq: Every day | ORAL | Status: DC
  Administered 2014-07-31: 20 mg via ORAL
  Filled 2014-07-31 (×2): qty 1

## 2014-07-31 MED ORDER — ESCITALOPRAM OXALATE 10 MG PO TABS
10.0000 mg | ORAL_TABLET | Freq: Every day | ORAL | Status: DC | PRN
  Filled 2014-07-31: qty 1

## 2014-07-31 MED ORDER — AMLODIPINE BESYLATE 10 MG PO TABS
10.0000 mg | ORAL_TABLET | Freq: Every day | ORAL | Status: DC
  Administered 2014-07-31 – 2014-08-01 (×2): 10 mg via ORAL
  Filled 2014-07-31 (×2): qty 1

## 2014-07-31 MED ORDER — ASPIRIN 81 MG PO CHEW
81.0000 mg | CHEWABLE_TABLET | Freq: Every day | ORAL | Status: DC
Start: 2014-07-31 — End: 2014-08-01
  Administered 2014-07-31 – 2014-08-01 (×2): 81 mg via ORAL
  Filled 2014-07-31 (×2): qty 1

## 2014-07-31 MED ORDER — OXYCODONE-ACETAMINOPHEN 5-325 MG PO TABS
1.0000 | ORAL_TABLET | Freq: Four times a day (QID) | ORAL | Status: DC | PRN
  Administered 2014-07-31: 2 via ORAL
  Filled 2014-07-31: qty 2

## 2014-07-31 MED ORDER — INSULIN ASPART 100 UNIT/ML ~~LOC~~ SOLN
0.0000 [IU] | Freq: Three times a day (TID) | SUBCUTANEOUS | Status: DC
  Administered 2014-07-31: 1 [IU] via SUBCUTANEOUS
  Administered 2014-07-31: 2 [IU] via SUBCUTANEOUS
  Administered 2014-08-01: 1 [IU] via SUBCUTANEOUS

## 2014-07-31 MED ORDER — METOLAZONE 5 MG PO TABS
5.0000 mg | ORAL_TABLET | Freq: Every day | ORAL | Status: DC
  Administered 2014-07-31 – 2014-08-01 (×2): 5 mg via ORAL
  Filled 2014-07-31 (×2): qty 1

## 2014-07-31 MED ORDER — CARVEDILOL 25 MG PO TABS
25.0000 mg | ORAL_TABLET | Freq: Two times a day (BID) | ORAL | Status: DC
  Administered 2014-07-31 – 2014-08-01 (×3): 25 mg via ORAL
  Filled 2014-07-31 (×5): qty 1

## 2014-07-31 MED ORDER — CETYLPYRIDINIUM CHLORIDE 0.05 % MT LIQD
7.0000 mL | Freq: Two times a day (BID) | OROMUCOSAL | Status: DC
  Administered 2014-07-31 – 2014-08-01 (×3): 7 mL via OROMUCOSAL

## 2014-07-31 MED ORDER — POTASSIUM CHLORIDE CRYS ER 20 MEQ PO TBCR
20.0000 meq | EXTENDED_RELEASE_TABLET | Freq: Once | ORAL | Status: AC
  Administered 2014-07-31: 20 meq via ORAL
  Filled 2014-07-31: qty 1

## 2014-07-31 MED ORDER — ONDANSETRON 4 MG PO TBDP
4.0000 mg | ORAL_TABLET | Freq: Three times a day (TID) | ORAL | Status: DC | PRN
  Filled 2014-07-31: qty 1

## 2014-07-31 MED ORDER — ALBUTEROL SULFATE (2.5 MG/3ML) 0.083% IN NEBU: 3.0000 mL | INHALATION_SOLUTION | Freq: Four times a day (QID) | RESPIRATORY_TRACT | Status: DC | PRN

## 2014-07-31 MED ORDER — VITAMIN D (ERGOCALCIFEROL) 1.25 MG (50000 UNIT) PO CAPS
50000.0000 [IU] | ORAL_CAPSULE | ORAL | Status: DC
  Administered 2014-08-01: 50000 [IU] via ORAL
  Filled 2014-07-31: qty 1

## 2014-07-31 MED ORDER — INSULIN DETEMIR 100 UNIT/ML ~~LOC~~ SOLN
12.0000 [IU] | Freq: Every day | SUBCUTANEOUS | Status: DC
  Administered 2014-07-31: 12 [IU] via SUBCUTANEOUS
  Filled 2014-07-31 (×3): qty 0.12

## 2014-07-31 MED ORDER — INSULIN DETEMIR 100 UNIT/ML ~~LOC~~ SOLN
10.0000 [IU] | Freq: Every day | SUBCUTANEOUS | Status: DC
  Filled 2014-07-31: qty 0.1

## 2014-07-31 MED ORDER — ENOXAPARIN SODIUM 30 MG/0.3ML ~~LOC~~ SOLN
30.0000 mg | SUBCUTANEOUS | Status: DC
  Administered 2014-07-31: 30 mg via SUBCUTANEOUS
  Filled 2014-07-31 (×2): qty 0.3

## 2014-07-31 MED ORDER — ISOSORB DINITRATE-HYDRALAZINE 20-37.5 MG PO TABS
2.0000 | ORAL_TABLET | Freq: Three times a day (TID) | ORAL | Status: DC
  Administered 2014-07-31 – 2014-08-01 (×4): 2 via ORAL
  Filled 2014-07-31 (×6): qty 2

## 2014-07-31 MED ORDER — BUDESONIDE-FORMOTEROL FUMARATE 160-4.5 MCG/ACT IN AERO
1.0000 | INHALATION_SPRAY | Freq: Every evening | RESPIRATORY_TRACT | Status: DC
  Administered 2014-07-31: 1 via RESPIRATORY_TRACT
  Filled 2014-07-31: qty 6

## 2014-07-31 MED ORDER — FAMOTIDINE 20 MG PO TABS
20.0000 mg | ORAL_TABLET | Freq: Every day | ORAL | Status: DC
  Administered 2014-07-31 – 2014-08-01 (×2): 20 mg via ORAL
  Filled 2014-07-31 (×2): qty 1

## 2014-07-31 MED ORDER — FUROSEMIDE 40 MG PO TABS
60.0000 mg | ORAL_TABLET | Freq: Two times a day (BID) | ORAL | Status: DC
  Administered 2014-07-31 – 2014-08-01 (×2): 60 mg via ORAL
  Filled 2014-07-31 (×4): qty 1

## 2014-07-31 MED ORDER — NITROGLYCERIN IN D5W 200-5 MCG/ML-% IV SOLN
2.0000 ug/min | INTRAVENOUS | Status: DC
  Administered 2014-07-31: 150 ug/min via INTRAVENOUS
  Administered 2014-07-31: 140 ug/min via INTRAVENOUS
  Filled 2014-07-31 (×2): qty 250

## 2014-07-31 MED ORDER — INSULIN ASPART 100 UNIT/ML ~~LOC~~ SOLN
0.0000 [IU] | Freq: Every day | SUBCUTANEOUS | Status: DC
  Administered 2014-07-31: 2 [IU] via SUBCUTANEOUS

## 2014-07-31 MED ORDER — INSULIN DETEMIR 100 UNIT/ML ~~LOC~~ SOLN
10.0000 [IU] | Freq: Every day | SUBCUTANEOUS | Status: DC
  Administered 2014-07-31: 10 [IU] via SUBCUTANEOUS
  Filled 2014-07-31: qty 0.1

## 2014-07-31 MED ORDER — POTASSIUM CHLORIDE CRYS ER 20 MEQ PO TBCR
20.0000 meq | EXTENDED_RELEASE_TABLET | Freq: Two times a day (BID) | ORAL | Status: DC
  Administered 2014-07-31 – 2014-08-01 (×4): 20 meq via ORAL
  Filled 2014-07-31 (×5): qty 1

## 2014-07-31 MED ORDER — SODIUM CHLORIDE 0.9 % IJ SOLN
3.0000 mL | Freq: Two times a day (BID) | INTRAMUSCULAR | Status: DC
  Administered 2014-07-31 (×3): 3 mL via INTRAVENOUS

## 2014-07-31 NOTE — Progress Notes (Signed)
Patient transferred with belongings, chart and medication to 3E25. VSS. No complaints at this time. Settled in room and given call bell. CCMD and [place] notified. Family aware of patient's transfer. Receiving RN aware.   Lum Babe, RN

## 2014-07-31 NOTE — Progress Notes (Signed)
Called report to [place], RN on [facility].

## 2014-07-31 NOTE — Progress Notes (Signed)
Utilization review completed.  

## 2014-07-31 NOTE — Consult Note (Signed)
CARDIOLOGY CONSULT NOTE  Patient ID: Destiny Day MRN: [id number] DOB/AGE: 64-May-1952 64 y.o.  Admit date: 07/30/2014 Referring Physician:  EDP Primary Physician:  Velna Hatchet, MD Reason for Consultation: SOB, CHF   HPI: Destiny Day is a 64 year old African American female who presented to the emergency department on 07/30/2014 for sudden onset SOB and abd swelling.  Blood pressure was over 035W systolic and she was started on Ntg gtt and IV Lasix.  She states she has been compliant with medications but admits to eating a lot of high sodium food lately.  She denies chest pain, PND, orthopnea, or extremity edema.  She reports feeling much better at this time.   She has no specific complaints this morning and feels well.  Past Medical History  Diagnosis Date  . Hypertension   . Diverticulitis   . Hyperlipidemia   . Asthma   . Diabetes mellitus     insulin dependent  . Arthritis     knee  . GI bleed 03/31/2013  . CHF (congestive heart failure)   . Osteoporosis   . Seasonal allergies   . Anxiety   . Abdominal bruit   . Chronic kidney disease     stage 3  . Sleep apnea     can't afford cpap  . Pneumonia   . GERD (gastroesophageal reflux disease)     from medications     Past Surgical History  Procedure Laterality Date  . Abdominal hysterectomy  1993`  . Esophagogastroduodenoscopy N/A 03/31/2013    Procedure: ESOPHAGOGASTRODUODENOSCOPY (EGD);  Surgeon: Gatha Mayer, MD;  Location: [facility] ENDOSCOPY;  Service: Endoscopy;  Laterality: N/A;  . Eye surgery      laser surgery  . Bascilic vein transposition Left 07/10/2014    Procedure: BASCILIC VEIN TRANSPOSITION;  Surgeon: Angelia Mould, MD;  Location: [facility] OR;  Service: Vascular;  Laterality: Left;     Family History  Problem Relation Age of Onset  . Colon cancer Neg Hx   . Other Mother     not sure of cause of death  . Diabetes Father   . Pancreatic cancer Maternal Grandmother      Social History: History    Social History  . Marital Status: Married    Spouse Name: [name]    Number of Children: 4  . Years of Education: some collg   Occupational History  . Retired    Social History Main Topics  . Smoking status: Former Smoker -- 0.35 packs/day for 40 years    Types: Cigarettes    Quit date: 05/10/2012  . Smokeless tobacco: Never Used  . Alcohol Use: No  . Drug Use: Yes     Comment: marijuana; quit in early 1980's  . Sexual Activity: Yes   Other Topics Concern  . Not on file   Social History Narrative   Patient lives at home with spouse.   Caffeine Use: 1 cup daily     Prescriptions prior to admission  Medication Sig Dispense Refill Last Dose  . albuterol (PROVENTIL HFA;VENTOLIN HFA) 108 (90 BASE) MCG/ACT inhaler Inhale 2 puffs into the lungs every 6 (six) hours as needed for wheezing.   unknown  . amLODipine (NORVASC) 10 MG tablet Take 1 tablet (10 mg total) by mouth daily. 30 tablet 0 07/30/2014 at Unknown time  . aspirin 81 MG chewable tablet Chew 81 mg by mouth daily.   07/29/2014 at Unknown time  . atorvastatin (LIPITOR) 20 MG tablet Take 20 mg by  mouth daily at 6 PM.   07/29/2014 at Unknown time  . budesonide-formoterol (SYMBICORT) 160-4.5 MCG/ACT inhaler Inhale 1 puff into the lungs every evening.   07/29/2014 at Unknown time  . calcitRIOL (ROCALTROL) 0.25 MCG capsule Take 0.25 mcg by mouth daily.   07/30/2014 at Unknown time  . Calcium Carbonate-Vit D-Min (CALCIUM 600+D PLUS MINERALS) 600-400 MG-UNIT TABS Take 1 tablet by mouth daily.   07/30/2014 at Unknown time  . escitalopram (LEXAPRO) 10 MG tablet Take 10 mg by mouth daily as needed (ANXIETY).   07/30/2014 at Unknown time  . famotidine (PEPCID) 20 MG tablet Take 20 mg by mouth daily.   07/30/2014 at Unknown time  . furosemide (LASIX) 40 MG tablet Take 1.5 tablets (60 mg total) by mouth 2 (two) times daily. (Patient taking differently: Take 80 mg by mouth 2 (two) times daily. ) 90 tablet 1 07/30/2014 at Unknown time  . insulin aspart  (NOVOLOG) 100 UNIT/ML injection Inject 0-8 Units into the skin 3 (three) times daily before meals. Sliding scale   07/30/2014 at Unknown time  . insulin detemir (LEVEMIR) 100 UNIT/ML injection Inject 10 Units into the skin at bedtime.   07/29/2014 at Unknown time  . irbesartan (AVAPRO) 150 MG tablet Take 1 tablet (150 mg total) by mouth daily. 30 tablet 1 07/30/2014 at Unknown time  . isosorbide-hydrALAZINE (BIDIL) 20-37.5 MG per tablet Take 2 tablets by mouth 3 (three) times daily. 180 tablet 1 07/30/2014 at Unknown time  . loratadine (CLARITIN) 10 MG tablet Take 10 mg by mouth daily as needed for allergies.   unknown  . metolazone (ZAROXOLYN) 5 MG tablet Take 1 tablet by mouth as needed (urination).    2 weeks  . potassium chloride SA (K-DUR,KLOR-CON) 20 MEQ tablet Take 20 mEq by mouth 2 (two) times daily.   07/30/2014 at Unknown time  . traMADol (ULTRAM) 50 MG tablet Take 50 mg by mouth every 6 (six) hours as needed (pain).    Past Week at Unknown time  . Vitamin D, Ergocalciferol, (DRISDOL) 50000 UNITS CAPS capsule Take 50,000 Units by mouth every 7 (seven) days. On wednesday   07/25/2014  . carvedilol (COREG) 25 MG tablet Take 1 tablet (25 mg total) by mouth 2 (two) times daily with a meal. (Patient not taking: Reported on 07/09/2014) 60 tablet 1 Not Taking at Unknown time  . cyclobenzaprine (FLEXERIL) 10 MG tablet Take 1 tablet (10 mg total) by mouth 2 (two) times daily as needed for muscle spasms. (Patient not taking: Reported on 07/30/2014) 20 tablet 0 Taking  . diazepam (VALIUM) 5 MG tablet Take 1 tablet (5 mg total) by mouth every 8 (eight) hours as needed for anxiety (spasms). (Patient not taking: Reported on 07/30/2014) 20 tablet 0 Taking  . ondansetron (ZOFRAN ODT) 4 MG disintegrating tablet 37m ODT q4 hours prn nausea/vomit (Patient not taking: Reported on 07/09/2014) 25 tablet 0 Not Taking at Unknown time  . ondansetron (ZOFRAN ODT) 4 MG disintegrating tablet 469mODT q4 hours prn nausea/vomit (Patient  not taking: Reported on 07/09/2014) 10 tablet 0 Not Taking at Unknown time  . oxyCODONE-acetaminophen (PERCOCET) 5-325 MG per tablet Take 1-2 tablets by mouth every 6 (six) hours as needed for moderate pain. (Patient not taking: Reported on 07/09/2014) 25 tablet 0 Not Taking at Unknown time  . oxyCODONE-acetaminophen (PERCOCET) 5-325 MG per tablet Take 1-2 tablets by mouth every 4 (four) hours as needed. (Patient taking differently: Take 1-2 tablets by mouth every 4 (four) hours as needed (  pain). ) 20 tablet 0 07/15/2014  . oxyCODONE-acetaminophen (PERCOCET/ROXICET) 5-325 MG per tablet Take 1 tablet by mouth every 6 (six) hours as needed. (Patient not taking: Reported on 07/30/2014) 30 tablet 0     Scheduled Meds: . amLODipine  10 mg Oral Daily  . antiseptic oral rinse  7 mL Mouth Rinse BID  . aspirin  81 mg Oral Daily  . atorvastatin  20 mg Oral q1800  . budesonide-formoterol  1 puff Inhalation QPM  . calcitRIOL  0.25 mcg Oral Daily  . carvedilol  25 mg Oral BID WC  . enoxaparin (LOVENOX) injection  30 mg Subcutaneous Q24H  . famotidine  20 mg Oral Daily  . furosemide  60 mg Intravenous 3 times per day  . insulin aspart  0-5 Units Subcutaneous QHS  . insulin aspart  0-9 Units Subcutaneous TID WC  . insulin detemir  12 Units Subcutaneous QHS  . irbesartan  150 mg Oral Daily  . isosorbide-hydrALAZINE  2 tablet Oral TID  . potassium chloride SA  20 mEq Oral BID  . sodium chloride  3 mL Intravenous Q12H  . sodium chloride  3 mL Intravenous Q12H  . [START ON 08/01/2014] Vitamin D (Ergocalciferol)  50,000 Units Oral Q7 days   Continuous Infusions: . nitroGLYCERIN 150 mcg/min (07/31/14 0928)   PRN Meds:.albuterol, escitalopram, hydrALAZINE, loratadine, ondansetron, oxyCODONE-acetaminophen  ROS: General: no fevers/chills/night sweats Eyes: no blurry vision, diplopia, or amaurosis ENT: no sore throat or hearing loss Resp: no cough, wheezing, or hemoptysis CV: no edema or palpitations GI: no  abdominal pain, nausea, vomiting, diarrhea, or constipation GU: no dysuria, frequency, or hematuria Skin: no rash Neuro: no headache, numbness, tingling, or weakness of extremities Musculoskeletal: no joint pain or swelling Heme: no bleeding, DVT, or easy bruising Endo: no polydipsia or polyuria    Physical Exam: Blood pressure 145/66, pulse 81, temperature 97.9 F (36.6 C), temperature source Oral, resp. rate 13, height 5' 5" (1.651 m), weight 64.6 kg (142 lb 6.7 oz), SpO2 100 %.   General appearance: alert, cooperative and no distress Neck: no adenopathy, no JVD, supple, symmetrical, trachea midline, thyroid not enlarged, symmetric, no tenderness/mass/nodules and soft bruit bilaterally Lungs: diminished breath sounds base - bilaterally and rales bilaterally Chest wall: no tenderness Heart: regular rate and rhythm, S1, S2 normal, no murmur, click, rub or gallop Extremities: extremities normal, atraumatic, no cyanosis or edema Pulses: 2+ and symmetric Skin: Skin color, texture, turgor normal. No rashes or lesions  Labs:   Lab Results  Component Value Date   WBC 10.4 07/31/2014   HGB 8.4* 07/31/2014   HCT 25.8* 07/31/2014   MCV 84.6 07/31/2014   PLT 338 07/31/2014    Recent Labs Lab 07/31/14 0117  NA 134*  K 3.2*  CL 98  CO2 29  BUN 35*  CREATININE 3.02*  CALCIUM 8.5  PROT 6.3  BILITOT 0.4  ALKPHOS 96  ALT 15  AST 19  GLUCOSE 212*   Lab Results  Component Value Date   CKTOTAL 73 08/23/2011   CKMB 3.4 08/23/2011   TROPONINI <0.30 05/15/2014    Lipid Panel     Component Value Date/Time   CHOL 184 12/01/2010 0455   TRIG 86 12/01/2010 0455   HDL 88 12/01/2010 0455   CHOLHDL 2.1 12/01/2010 0455   VLDL 17 12/01/2010 0455   LDLCALC  12/01/2010 0455    79        Total Cholesterol/HDL:CHD Risk Coronary Heart Disease Risk Table  Men   Women  1/2 Average Risk   3.4   3.3  Average Risk       5.0   4.4  2 X Average Risk   9.6   7.1  3 X  Average Risk  23.4   11.0        Use the calculated Patient Ratio above and the CHD Risk Table to determine the patient's CHD Risk.        ATP III CLASSIFICATION (LDL):  <100     mg/dL   Optimal  100-129  mg/dL   Near or Above                    Optimal  130-159  mg/dL   Borderline  160-189  mg/dL   High  >190     mg/dL   Very High    EKG 07/30/2014: Compared to 05/15/2014: Normal sinus rhythm/sinus tachycardia, left atrial abnormality, LVH with repolarization abnormality, cannot exclude lateral ischemia. No significant change. PAC.    Radiology: Dg Chest Port 1 View  07/31/2014   CLINICAL DATA:  Congestive heart failure, shortness of breath.  EXAM: PORTABLE CHEST - 1 VIEW  COMPARISON:  July 30, 2014.  FINDINGS: Stable cardiomegaly. Stable mild central pulmonary vascular congestion is noted. No pneumothorax is noted. Improved bilateral lung opacities are noted consistent with improving edema. Probable small bilateral pleural effusions are present. Degenerative changes seen involving the right shoulder with subacromial narrowing suggesting rotator cuff injury.  IMPRESSION: Stable cardiomegaly and central pulmonary vascular congestion. Improved pulmonary edema is noted compared to prior exam, although small bilateral pleural effusions remain.   Electronically Signed   By: Sabino Dick M.D.   On: 07/31/2014 08:41   Dg Chest Portable 1 View  07/30/2014   CLINICAL DATA:  Shortness of breath today with vomiting.  EXAM: PORTABLE CHEST - 1 VIEW  COMPARISON:  05/15/2014.  FINDINGS: Mildly enlarged cardiac silhouette with a mild increase in size. Diffuse increase in prominence of the pulmonary vasculature and interstitial markings, including Kerley lines. Probable small bilateral pleural effusions. Diffuse osteopenia. Bilateral shoulder degenerative changes.  IMPRESSION: Interval cardiomegaly and changes of acute congestive heart failure.   Electronically Signed   By: Enrique Sack M.D.   On: 07/30/2014  19:42      ASSESSMENT AND PLAN:  1. Acute on chronic diastolic HF 2. HTN with hypertensive urgency 3. 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 Diabetes mellitus type 2 uncontrolled 5. Noncompliance with medications and diet 6. Hyperlipidemia  Recommendation: Patient previously has had similar presentation, presenting with shortness of breath and congestive heart failure with hypertensive emergency. But on appropriate medications, her blood pressures returned back to baseline, patient's symptoms are improved. At this point reinforcement of compliance is the only thing that I can offer. I had been seeing her frequently in the office, however patient stated that her insurance and co-pays are expensive and hence I admitted her when necessary and she had previously promised that she'll take her medications appropriately, especially after she found out that she is nearing dialysis due to uncontrolled diabetes and hypertension. I have discontinued IV NTG and can transition back to PO lasix and restart Zaroxolyn home dose. Met husband and son also.  Rachel Bo, NP-C 07/31/2014, 10:39 AM [facility] Pager: [phone] Office: [phone]  I have personally reviewed the patient's record and performed physical exam and agree with the assessment and plan of Ms. Neldon Labella,  NP-C.  Laverda Page, MD [facility] Pager: [phone].   Office: [phone] If no answer: Mobile: [phone]

## 2014-07-31 NOTE — Progress Notes (Signed)
Pt's hgb has dropped from 10.1 to 8.4,  Md has been paged [phone]), awaiting a call back. Will pass it off to on-coming rn to follow up.--------Holleigh Crihfield, rn

## 2014-07-31 NOTE — Progress Notes (Signed)
Subjective: Admitted last night c Flash Pulm Edema/Acute CHF/HTN Urgency - Treated c Diuretics and NTG gtt. She is much better this am. Sitting up and no tachypnea. No CP   Objective: Vital signs in last 24 hours: Temp:  [98.2 F (36.8 C)-98.7 F (37.1 C)] 98.2 F (36.8 C) (01/05 0437) Pulse Rate:  [86-107] 90 (01/05 0437) Resp:  [9-33] 19 (01/05 0437) BP: (144-214)/(59-132) 156/70 mmHg (01/05 0437) SpO2:  [84 %-100 %] 100 % (01/05 0437) Weight:  [64.6 kg (142 lb 6.7 oz)] 64.6 kg (142 lb 6.7 oz) (01/05 0500) Weight change:  Last BM Date:  (pt don't remember, states she go for days without a bm)  CBG (last 3)   Recent Labs  07/30/14 2314  GLUCAP 222*    Intake/Output from previous day:  Intake/Output Summary (Last 24 hours) at 07/31/14 0745 Last data filed at 07/31/14 0700  Gross per 24 hour  Intake 311.25 ml  Output    575 ml  Net -263.75 ml   01/04 0701 - 01/05 0700 In: 311.3 [I.V.:311.3] Out: 575 [Urine:575]   Physical Exam General appearance: Back to baseline Eyes: no scleral icterus Throat: oropharynx moist without erythema Resp: Clearer Cardio: Reg GI: soft, non-tender; bowel sounds normal; no masses,  no organomegaly Extremities: no clubbing, cyanosis or edema   Lab Results:  Recent Labs  07/30/14 1935 07/31/14 0117  NA 136 134*  K 3.2* 3.2*  CL 99 98  CO2 29 29  GLUCOSE 225* 212*  BUN 34* 35*  CREATININE 2.89* 3.02*  CALCIUM 8.9 8.5  MG  --  1.6  PHOS  --  4.1     Recent Labs  07/30/14 1935 07/31/14 0117  AST 26 19  ALT 17 15  ALKPHOS 120* 96  BILITOT 0.2* 0.4  PROT 7.3 6.3  ALBUMIN 3.3* 2.9*     Recent Labs  07/30/14 1935 07/31/14 0117  WBC 5.7 10.4  NEUTROABS 2.8 7.5  HGB 10.1* 8.4*  HCT 31.7* 25.8*  MCV 85.4 84.6  PLT 364 338    Lab Results  Component Value Date   INR 0.96 01/09/2014   INR 0.91 07/20/2013   INR 0.95 05/23/2013    No results for input(s): CKTOTAL, CKMB, CKMBINDEX, TROPONINI in the last 72  hours.  No results for input(s): TSH, T4TOTAL, T3FREE, THYROIDAB in the last 72 hours.  Invalid input(s): FREET3  No results for input(s): VITAMINB12, FOLATE, FERRITIN, TIBC, IRON, RETICCTPCT in the last 72 hours.  Micro Results: Recent Results (from the past 240 hour(s))  MRSA PCR Screening     Status: None   Collection Time: 07/31/14  2:45 AM  Result Value Ref Range Status   MRSA by PCR NEGATIVE NEGATIVE Final    Comment:        The GeneXpert MRSA Assay (FDA approved for NASAL specimens only), is one component of a comprehensive MRSA colonization surveillance program. It is not intended to diagnose MRSA infection nor to guide or monitor treatment for MRSA infections.      Studies/Results: Dg Chest Portable 1 View  07/30/2014   CLINICAL DATA:  Shortness of breath today with vomiting.  EXAM: PORTABLE CHEST - 1 VIEW  COMPARISON:  05/15/2014.  FINDINGS: Mildly enlarged cardiac silhouette with a mild increase in size. Diffuse increase in prominence of the pulmonary vasculature and interstitial markings, including Kerley lines. Probable small bilateral pleural effusions. Diffuse osteopenia. Bilateral shoulder degenerative changes.  IMPRESSION: Interval cardiomegaly and changes of acute congestive heart failure.  Electronically Signed   By: Enrique Sack Day.D.   On: 07/30/2014 19:42     Medications: Scheduled: . amLODipine  10 mg Oral Daily  . antiseptic oral rinse  7 mL Mouth Rinse BID  . aspirin  81 mg Oral Daily  . atorvastatin  20 mg Oral q1800  . budesonide-formoterol  1 puff Inhalation QPM  . calcitRIOL  0.25 mcg Oral Daily  . carvedilol  25 mg Oral BID WC  . enoxaparin (LOVENOX) injection  30 mg Subcutaneous Q24H  . famotidine  20 mg Oral Daily  . furosemide  60 mg Intravenous 3 times per day  . insulin aspart  0-5 Units Subcutaneous QHS  . insulin aspart  0-9 Units Subcutaneous TID WC  . insulin detemir  10 Units Subcutaneous QHS  . irbesartan  150 mg Oral Daily  .  isosorbide-hydrALAZINE  2 tablet Oral TID  . potassium chloride SA  20 mEq Oral BID  . sodium chloride  3 mL Intravenous Q12H  . sodium chloride  3 mL Intravenous Q12H  . [START ON 08/01/2014] Vitamin D (Ergocalciferol)  50,000 Units Oral Q7 days   Continuous: . nitroGLYCERIN 150 mcg/min (07/31/14 0105)     Assessment/Plan: Active Problems:   CHF (congestive heart failure)  CHF/Vol Overload/Flash Pulm Edema - Diuresed and placed on NTG gtt. BP some better. Follow up on CXR and BNP Will need to wean NTG gtt and use orals. Dr Einar Gip to see pt later today and titrate meds Continue Lasix 60 IV TID  Replete K  AKI on CKDz - May worsen c Diuresis.  Has Fistula in place.  No Acute HD needs.  Cr up to 3.02 and has been 2.5-2.9 lately  Poorly controlled DM2 - Levimir and ISS - Titrate to keep CBGs 100-200 -  Recent Labs  07/30/14 2314  GLUCAP 222*    Anemia - Hbg 8.4 - do not believe she is bleeding.  Type and screen ordered.  HTN - S/P Urgency/Emergency - BP doing some better than in ED.  DVT Prophylaxis    LOS: 1 day   Destiny Day 07/31/2014, 7:45 AM

## 2014-08-01 LAB — CBC WITH DIFFERENTIAL/PLATELET
Basophils Absolute: 0 10*3/uL (ref 0.0–0.1)
Basophils Absolute: 0 10*3/uL (ref 0.0–0.1)
Basophils Relative: 0 % (ref 0–1)
Basophils Relative: 0 % (ref 0–1)
Eosinophils Absolute: 0.1 10*3/uL (ref 0.0–0.7)
Eosinophils Absolute: 0.2 10*3/uL (ref 0.0–0.7)
Eosinophils Relative: 1 % (ref 0–5)
Eosinophils Relative: 3 % (ref 0–5)
HCT: 25.8 % — ABNORMAL LOW (ref 36.0–46.0)
HCT: 25.7 % — ABNORMAL LOW (ref 36.0–46.0)
Hemoglobin: 8.4 g/dL — ABNORMAL LOW (ref 12.0–15.0)
Hemoglobin: 8.3 g/dL — ABNORMAL LOW (ref 12.0–15.0)
Lymphocytes Relative: 22 % (ref 12–46)
Lymphocytes Relative: 43 % (ref 12–46)
Lymphs Abs: 2.3 10*3/uL (ref 0.7–4.0)
Lymphs Abs: 2.8 10*3/uL (ref 0.7–4.0)
MCH: 27.5 pg (ref 26.0–34.0)
MCH: 28.4 pg (ref 26.0–34.0)
MCHC: 32.6 g/dL (ref 30.0–36.0)
MCHC: 32.3 g/dL (ref 30.0–36.0)
MCV: 84.6 fL (ref 78.0–100.0)
MCV: 88 fL (ref 78.0–100.0)
Monocytes Absolute: 0.6 10*3/uL (ref 0.1–1.0)
Monocytes Absolute: 0.5 10*3/uL (ref 0.1–1.0)
Monocytes Relative: 5 % (ref 3–12)
Monocytes Relative: 7 % (ref 3–12)
Neutro Abs: 7.5 10*3/uL (ref 1.7–7.7)
Neutro Abs: 3.1 10*3/uL (ref 1.7–7.7)
Neutrophils Relative %: 72 % (ref 43–77)
Neutrophils Relative %: 47 % (ref 43–77)
Platelets: 338 10*3/uL (ref 150–400)
Platelets: 290 10*3/uL (ref 150–400)
RBC: 3.05 MIL/uL — ABNORMAL LOW (ref 3.87–5.11)
RBC: 2.92 MIL/uL — ABNORMAL LOW (ref 3.87–5.11)
RDW: 13 % (ref 11.5–15.5)
RDW: 13.2 % (ref 11.5–15.5)
WBC: 10.4 10*3/uL (ref 4.0–10.5)
WBC: 6.7 10*3/uL (ref 4.0–10.5)

## 2014-08-01 LAB — COMPREHENSIVE METABOLIC PANEL
ALT: 12 U/L (ref 0–35)
AST: 18 U/L (ref 0–37)
Albumin: 2.7 g/dL — ABNORMAL LOW (ref 3.5–5.2)
Alkaline Phosphatase: 85 U/L (ref 39–117)
Anion gap: 9 (ref 5–15)
BUN: 39 mg/dL — ABNORMAL HIGH (ref 6–23)
CO2: 29 mmol/L (ref 19–32)
Calcium: 8.5 mg/dL (ref 8.4–10.5)
Chloride: 98 mEq/L (ref 96–112)
Creatinine, Ser: 3.63 mg/dL — ABNORMAL HIGH (ref 0.50–1.10)
GFR calc Af Amer: 14 mL/min — ABNORMAL LOW (ref >90)
GFR calc non Af Amer: 12 mL/min — ABNORMAL LOW (ref >90)
Glucose, Bld: 196 mg/dL — ABNORMAL HIGH (ref 70–99)
Potassium: 3.7 mmol/L (ref 3.5–5.1)
Sodium: 136 mmol/L (ref 135–145)
Total Bilirubin: 0.5 mg/dL (ref 0.3–1.2)
Total Protein: 5.8 g/dL — ABNORMAL LOW (ref 6.0–8.3)

## 2014-08-01 LAB — GLUCOSE, CAPILLARY: Glucose-Capillary: 138 mg/dL — ABNORMAL HIGH (ref 70–99)

## 2014-08-01 LAB — BRAIN NATRIURETIC PEPTIDE: B Natriuretic Peptide: 433.9 pg/mL — ABNORMAL HIGH (ref 0.0–100.0)

## 2014-08-01 MED ORDER — DARBEPOETIN ALFA 100 MCG/0.5ML IJ SOSY
100.0000 ug | PREFILLED_SYRINGE | INTRAMUSCULAR | Status: DC
  Filled 2014-08-01: qty 0.5

## 2014-08-01 MED ORDER — DARBEPOETIN ALFA 40 MCG/0.4ML IJ SOSY
40.0000 ug | PREFILLED_SYRINGE | INTRAMUSCULAR | Status: DC
  Filled 2014-08-01 (×2): qty 0.4

## 2014-08-01 MED ORDER — DARBEPOETIN ALFA 100 MCG/0.5ML IJ SOSY: 100.0000 ug | PREFILLED_SYRINGE | INTRAMUSCULAR | Status: DC

## 2014-08-01 NOTE — Progress Notes (Signed)
Called Dr. Keane Police office, spoke to [facility] (Psychologist, sport and exercise) to inform him of pt's BP 171/68; HR 81.  Pt is scheduled to be discharged.

## 2014-08-01 NOTE — Progress Notes (Signed)
MEDICATION RELATED CONSULT NOTE - INITIAL   Pharmacy Consult for aranesp Indication: anemia d/t CKD  Allergies  Allergen Reactions  . Lisinopril Cough  . Prednisone Other (See Comments)    Excessive fluid buildup    Patient Measurements: Height: 5\' 5"  (165.1 cm) Weight: 144 lb 10 oz (65.6 kg) IBW/kg (Calculated) : 57 Adjusted Body Weight:   Vital Signs: Temp: 98.3 F (36.8 C) (01/06 0525) Temp Source: Oral (01/06 0525) BP: 151/65 mmHg (01/06 0525) Pulse Rate: 81 (01/06 0525) Intake/Output from previous day: 01/05 0701 - 01/06 0700 In: 430 [P.O.:430] Out: 925 [Urine:925] Intake/Output from this shift:    Labs:  Recent Labs  07/30/14 1935 07/31/14 0117 08/01/14 0316  WBC 5.7 10.4 6.7  HGB 10.1* 8.4* 8.3*  HCT 31.7* 25.8* 25.7*  PLT 364 338 290  CREATININE 2.89* 3.02* 3.63*  MG  --  1.6  --   PHOS  --  4.1  --   ALBUMIN 3.3* 2.9* 2.7*  PROT 7.3 6.3 5.8*  AST 26 19 18   ALT 17 15 12   ALKPHOS 120* 96 85  BILITOT 0.2* 0.4 0.5   Estimated Creatinine Clearance: 14.3 mL/min (by C-G formula based on Cr of 3.63).   Microbiology: Recent Results (from the past 720 hour(s))  Surgical pcr screen     Status: None   Collection Time: 07/10/14  8:42 AM  Result Value Ref Range Status   MRSA, PCR NEGATIVE NEGATIVE Final   Staphylococcus aureus NEGATIVE NEGATIVE Final    Comment:        The Xpert SA Assay (FDA approved for NASAL specimens in patients over 14 years of age), is one component of a comprehensive surveillance program.  Test performance has been validated by [employer] for patients greater than or equal to 77 year old. It is not intended to diagnose infection nor to guide or monitor treatment.   MRSA PCR Screening     Status: None   Collection Time: 07/31/14  2:45 AM  Result Value Ref Range Status   MRSA by PCR NEGATIVE NEGATIVE Final    Comment:        The GeneXpert MRSA Assay (FDA approved for NASAL specimens only), is one component of  a comprehensive MRSA colonization surveillance program. It is not intended to diagnose MRSA infection nor to guide or monitor treatment for MRSA infections.     Medical History: Past Medical History  Diagnosis Date  . Hypertension   . Diverticulitis   . Hyperlipidemia   . Asthma   . Diabetes mellitus     insulin dependent  . Arthritis     knee  . GI bleed 03/31/2013  . CHF (congestive heart failure)   . Osteoporosis   . Seasonal allergies   . Anxiety   . Abdominal bruit   . Chronic kidney disease     stage 3  . Sleep apnea     can't afford cpap  . Pneumonia   . GERD (gastroesophageal reflux disease)     from medications    Medications:  Prescriptions prior to admission  Medication Sig Dispense Refill Last Dose  . albuterol (PROVENTIL HFA;VENTOLIN HFA) 108 (90 BASE) MCG/ACT inhaler Inhale 2 puffs into the lungs every 6 (six) hours as needed for wheezing.   unknown  . amLODipine (NORVASC) 10 MG tablet Take 1 tablet (10 mg total) by mouth daily. 30 tablet 0 07/30/2014 at Unknown time  . aspirin 81 MG chewable tablet Chew 81 mg by mouth daily.  07/29/2014 at Unknown time  . atorvastatin (LIPITOR) 20 MG tablet Take 20 mg by mouth daily at 6 PM.   07/29/2014 at Unknown time  . budesonide-formoterol (SYMBICORT) 160-4.5 MCG/ACT inhaler Inhale 1 puff into the lungs every evening.   07/29/2014 at Unknown time  . calcitRIOL (ROCALTROL) 0.25 MCG capsule Take 0.25 mcg by mouth daily.   07/30/2014 at Unknown time  . Calcium Carbonate-Vit D-Min (CALCIUM 600+D PLUS MINERALS) 600-400 MG-UNIT TABS Take 1 tablet by mouth daily.   07/30/2014 at Unknown time  . escitalopram (LEXAPRO) 10 MG tablet Take 10 mg by mouth daily as needed (ANXIETY).   07/30/2014 at Unknown time  . famotidine (PEPCID) 20 MG tablet Take 20 mg by mouth daily.   07/30/2014 at Unknown time  . furosemide (LASIX) 40 MG tablet Take 1.5 tablets (60 mg total) by mouth 2 (two) times daily. (Patient taking differently: Take 80 mg by mouth  2 (two) times daily. ) 90 tablet 1 07/30/2014 at Unknown time  . insulin aspart (NOVOLOG) 100 UNIT/ML injection Inject 0-8 Units into the skin 3 (three) times daily before meals. Sliding scale   07/30/2014 at Unknown time  . insulin detemir (LEVEMIR) 100 UNIT/ML injection Inject 10 Units into the skin at bedtime.   07/29/2014 at Unknown time  . irbesartan (AVAPRO) 150 MG tablet Take 1 tablet (150 mg total) by mouth daily. 30 tablet 1 07/30/2014 at Unknown time  . isosorbide-hydrALAZINE (BIDIL) 20-37.5 MG per tablet Take 2 tablets by mouth 3 (three) times daily. 180 tablet 1 07/30/2014 at Unknown time  . loratadine (CLARITIN) 10 MG tablet Take 10 mg by mouth daily as needed for allergies.   unknown  . metolazone (ZAROXOLYN) 5 MG tablet Take 1 tablet by mouth as needed (urination).    2 weeks  . potassium chloride SA (K-DUR,KLOR-CON) 20 MEQ tablet Take 20 mEq by mouth 2 (two) times daily.   07/30/2014 at Unknown time  . traMADol (ULTRAM) 50 MG tablet Take 50 mg by mouth every 6 (six) hours as needed (pain).    Past Week at Unknown time  . Vitamin D, Ergocalciferol, (DRISDOL) 50000 UNITS CAPS capsule Take 50,000 Units by mouth every 7 (seven) days. On wednesday   07/25/2014  . carvedilol (COREG) 25 MG tablet Take 1 tablet (25 mg total) by mouth 2 (two) times daily with a meal. (Patient not taking: Reported on 07/09/2014) 60 tablet 1 Not Taking at Unknown time  . cyclobenzaprine (FLEXERIL) 10 MG tablet Take 1 tablet (10 mg total) by mouth 2 (two) times daily as needed for muscle spasms. (Patient not taking: Reported on 07/30/2014) 20 tablet 0 Taking  . diazepam (VALIUM) 5 MG tablet Take 1 tablet (5 mg total) by mouth every 8 (eight) hours as needed for anxiety (spasms). (Patient not taking: Reported on 07/30/2014) 20 tablet 0 Taking  . ondansetron (ZOFRAN ODT) 4 MG disintegrating tablet 4mg  ODT q4 hours prn nausea/vomit (Patient not taking: Reported on 07/09/2014) 25 tablet 0 Not Taking at Unknown time  . ondansetron  (ZOFRAN ODT) 4 MG disintegrating tablet 4mg  ODT q4 hours prn nausea/vomit (Patient not taking: Reported on 07/09/2014) 10 tablet 0 Not Taking at Unknown time  . oxyCODONE-acetaminophen (PERCOCET) 5-325 MG per tablet Take 1-2 tablets by mouth every 6 (six) hours as needed for moderate pain. (Patient not taking: Reported on 07/09/2014) 25 tablet 0 Not Taking at Unknown time  . oxyCODONE-acetaminophen (PERCOCET) 5-325 MG per tablet Take 1-2 tablets by mouth every 4 (four) hours as  needed. (Patient taking differently: Take 1-2 tablets by mouth every 4 (four) hours as needed (pain). ) 20 tablet 0 07/15/2014  . oxyCODONE-acetaminophen (PERCOCET/ROXICET) 5-325 MG per tablet Take 1 tablet by mouth every 6 (six) hours as needed. (Patient not taking: Reported on 07/30/2014) 30 tablet 0     Assessment: 79 yof presented to the hospital with SOB. Pt is now anemic to receive aranesp d/t kidney disease. Hgb today is 8.3. No bleeding noted. No anemia panel available.   Goal of Therapy:  Treatment of anemia  Plan:  1. Aranesp 4mcg SQ Q7 days 2. Anemia panel now 3. F/u CBC  Caitland Porchia, Rande Lawman 08/01/2014,8:58 AM

## 2014-08-01 NOTE — Progress Notes (Signed)
D/C'd tele; D/C'd IV; D/C'd pt.  Explained discharge instructions to pt and she had no further questions at this time.  Pt in no acute distress.

## 2014-08-01 NOTE — Discharge Summary (Addendum)
Physician Discharge Summary  DISCHARGE SUMMARY   Patient ID: Destiny Day#: [id number] DOB/AGE: [birth year] 64 y.o.   Attending Physician:Keysi Oelkers M  Patient's QBH:[id number], Destiny Reaper, MD  Consults:  Dr Einar Gip  Admit date: 07/30/2014 Discharge date: 08/01/2014  Discharge Diagnoses:  Active Problems:   CHF (congestive heart failure)   Patient Active Problem List   Diagnosis Date Noted  . CHF (congestive heart failure) 07/30/2014  . CKD (chronic kidney disease) stage 4, GFR 15-29 ml/min 06/27/2014  . Acute exacerbation of CHF (congestive heart failure) 01/10/2014  . Hypertensive heart disease 05/25/2013  . CKD (chronic kidney disease) stage 3, GFR 30-59 ml/min 05/25/2013  . Malignant hypertension 05/23/2013  . Acute respiratory failure 05/23/2013  . Hypertensive urgency 05/23/2013  . Pulmonary edema 05/23/2013  . Type 2 diabetes mellitus with diabetic nephropathy 05/23/2013  . Type 2 diabetes mellitus with hyperosmolar nonketotic hyperglycemia 04/13/2013  . Chronic diastolic CHF (congestive heart failure) 04/13/2013  . UGI bleed 03/31/2013  . Hypokalemia 08/24/2012  . Diastolic CHF, acute on chronic 08/22/2011  . HTN (hypertension), malignant 08/22/2011  . Type II or unspecified type diabetes mellitus without mention of complication, not stated as uncontrolled 12/27/2007  . HYPERLIPIDEMIA 12/27/2007  . HYPERTENSION 12/27/2007  . ALLERGIC RHINITIS 12/27/2007  . ASTHMA 12/27/2007   Past Medical History  Diagnosis Date  . Hypertension   . Diverticulitis   . Hyperlipidemia   . Asthma   . Diabetes mellitus     insulin dependent  . Arthritis     knee  . GI bleed 03/31/2013  . CHF (congestive heart failure)   . Osteoporosis   . Seasonal allergies   . Anxiety   . Abdominal bruit   . Chronic kidney disease     stage 3  . Sleep apnea     can't afford cpap  . Pneumonia   . GERD (gastroesophageal reflux disease)     from medications    Discharged Condition:  good   Discharge Medications:   Medication List    TAKE these medications        albuterol 108 (90 BASE) MCG/ACT inhaler  Commonly known as:  PROVENTIL HFA;VENTOLIN HFA  Inhale 2 puffs into the lungs every 6 (six) hours as needed for wheezing.     amLODipine 10 MG tablet  Commonly known as:  NORVASC  Take 1 tablet (10 mg total) by mouth daily.     aspirin 81 MG chewable tablet  Chew 81 mg by mouth daily.     atorvastatin 20 MG tablet  Commonly known as:  LIPITOR  Take 20 mg by mouth daily at 6 PM.     budesonide-formoterol 160-4.5 MCG/ACT inhaler  Commonly known as:  SYMBICORT  Inhale 1 puff into the lungs every evening.     calcitRIOL 0.25 MCG capsule  Commonly known as:  ROCALTROL  Take 0.25 mcg by mouth daily.     CALCIUM 600+D PLUS MINERALS 600-400 MG-UNIT Tabs  Take 1 tablet by mouth daily.     carvedilol 25 MG tablet  Commonly known as:  COREG  Take 1 tablet (25 mg total) by mouth 2 (two) times daily with a meal.     cyclobenzaprine 10 MG tablet  Commonly known as:  FLEXERIL  Take 1 tablet (10 mg total) by mouth 2 (two) times daily as needed for muscle spasms.     diazepam 5 MG tablet  Commonly known as:  VALIUM  Take 1 tablet (5 mg total) by mouth every 8 (  eight) hours as needed for anxiety (spasms).     escitalopram 10 MG tablet  Commonly known as:  LEXAPRO  Take 10 mg by mouth daily as needed (ANXIETY).     furosemide 40 MG tablet  Commonly known as:  LASIX  Take 1.5 tablets (60 mg total) by mouth 2 (two) times daily.     insulin aspart 100 UNIT/ML injection  Commonly known as:  novoLOG  Inject 0-8 Units into the skin 3 (three) times daily before meals. Sliding scale     insulin detemir 100 UNIT/ML injection  Commonly known as:  LEVEMIR  Inject 10 Units into the skin at bedtime.     irbesartan 150 MG tablet  Commonly known as:  AVAPRO  Take 1 tablet (150 mg total) by mouth daily.     isosorbide-hydrALAZINE 20-37.5 MG per tablet  Commonly  known as:  BIDIL  Take 2 tablets by mouth 3 (three) times daily.     loratadine 10 MG tablet  Commonly known as:  CLARITIN  Take 10 mg by mouth daily as needed for allergies.     metolazone 5 MG tablet  Commonly known as:  ZAROXOLYN  Take 1 tablet by mouth as needed (urination).     ondansetron 4 MG disintegrating tablet  Commonly known as:  ZOFRAN ODT  61m ODT q4 hours prn nausea/vomit     oxyCODONE-acetaminophen 5-325 MG per tablet  Commonly known as:  PERCOCET  Take 1-2 tablets by mouth every 6 (six) hours as needed for moderate pain.     PEPCID 20 MG tablet  Generic drug:  famotidine  Take 20 mg by mouth daily.     potassium chloride SA 20 MEQ tablet  Commonly known as:  K-DUR,KLOR-CON  Take 20 mEq by mouth 2 (two) times daily.     traMADol 50 MG tablet  Commonly known as:  ULTRAM  Take 50 mg by mouth every 6 (six) hours as needed (pain).     Vitamin D (Ergocalciferol) 50000 UNITS Caps capsule  Commonly known as:  DRISDOL  Take 50,000 Units by mouth every 7 (seven) days. [facility]cedures: Dg Chest Port 1 View  07/31/2014   CLINICAL DATA:  Congestive heart failure, shortness of breath.  EXAM: PORTABLE CHEST - 1 VIEW  COMPARISON:  July 30, 2014.  FINDINGS: Stable cardiomegaly. Stable mild central pulmonary vascular congestion is noted. No pneumothorax is noted. Improved bilateral lung opacities are noted consistent with improving edema. Probable small bilateral pleural effusions are present. Degenerative changes seen involving the right shoulder with subacromial narrowing suggesting rotator cuff injury.  IMPRESSION: Stable cardiomegaly and central pulmonary vascular congestion. Improved pulmonary edema is noted compared to prior exam, although small bilateral pleural effusions remain.   Electronically Signed   By: JSabino DickM.D.   On: 07/31/2014 08:41   Dg Chest Portable 1 View  07/30/2014   CLINICAL DATA:  Shortness of breath today with  vomiting.  EXAM: PORTABLE CHEST - 1 VIEW  COMPARISON:  05/15/2014.  FINDINGS: Mildly enlarged cardiac silhouette with a mild increase in size. Diffuse increase in prominence of the pulmonary vasculature and interstitial markings, including Kerley lines. Probable small bilateral pleural effusions. Diffuse osteopenia. Bilateral shoulder degenerative changes.  IMPRESSION: Interval cardiomegaly and changes of acute congestive heart failure.   Electronically Signed   By: SEnrique SackM.D.   On: 07/30/2014 19:42    History of Present Illness: Presented to med attention 1/4  night c Flash Pulm Edema/Acute CHF/HTN Emergancy - Treated c Diuretics and NTG gtt. Admitted [facility] Course: Admitted c Flash Pulm Edema/Acute CHF/HTN Emergancy - Treated c Diuretics and NTG gtt. She was much better 1/5 am CXR improved. Sitting up and no tachypnea. No CP Seen by Dr Einar Gip 1/5 and his note stated: 1. Acute on chronic diastolic HF 2. HTN with hypertensive urgency 3. 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 Diabetes mellitus type 2 uncontrolled 5. Noncompliance with medications and diet 6. Hyperlipidemia  Recommendation: Patient previously has had similar presentation, presenting with shortness of breath and congestive heart failure with hypertensive emergency. But on appropriate medications, her blood pressures returned back to baseline, patient's symptoms are improved. At this point reinforcement of compliance is the only thing that I can offer. I had been seeing her frequently in the office, however patient stated that her insurance and co-pays are expensive and hence I admitted her when necessary and she had previously promised that she'll take her medications appropriately, especially after she found out that she is nearing dialysis due to uncontrolled diabetes and hypertension. I have discontinued IV NTG and can transition back to PO lasix and  restart Zaroxolyn home dose. Met husband and son also.  After that encounter I moved/Transferred her to [address].  She is CP/SOB free.  She is eating.  She is moving about well.  She is talking s Tachypnea.  I tried to increase her Lantus to 12 but she refused.  Sugars this am 138-183. Cr bumped to 3.63 after all the IV Lasix and PO Zarox.  She will need quick follow up to follow up on her Cr.  No HD needs identified.  She has fistula in place from 3 weeks ago.  Will see Dr Doren Custard on Feb 3rd to see if it is mature.  Nepho is Dr Lorrene Reid and unfortunately Ms Puebla will not be able to make that appt.  I called Dr Lorrene Reid and updated her on Admission and findings. Hbg at 8.3-8.4.  Iron parameters ordered and 100 mcg Aranesp ordered.  She was typed and screened 07/31/13.  Will give Aranesp x 1 here to help aid her blood counts.  Recent Hbgs have been steady at 10.3 - 10.6 and the worsening anemia is new.  If worsens consider other w/up as necessary.  She denies Bloody stools or Melena.     Peak Trop I 0.05  She is much better and ready for D/c on home meds today.  She needs to stay compliant and keep salt down. Will d/c post aranesp. BPs are lots better than admit of > 200. Will need f/up labs and BP eval at follow up.    Day of Discharge Exam BP 151/65 mmHg  Pulse 81  Temp(Src) 98.3 F (36.8 C) (Oral)  Resp 18  Ht '5\' 5"'  (1.651 m)  Wt 65.6 kg (144 lb 10 oz)  BMI 24.07 kg/m2  SpO2 97%  Physical Exam: General appearance: Back to baseline Eyes: no scleral icterus Throat: oropharynx moist without erythema Resp: Clear Cardio: Reg GI: soft, non-tender; bowel sounds normal; no masses, no organomegaly Extremities: no clubbing, cyanosis or edema  Discharge Labs:  Recent Labs  07/31/14 0117 08/01/14 0316  NA 134* 136  K 3.2* 3.7  CL 98 98  CO2 29 29  GLUCOSE 212* 196*  BUN 35* 39*  CREATININE 3.02* 3.63*  CALCIUM 8.5 8.5  MG 1.6  --  PHOS 4.1  --     Recent Labs  07/31/14 0117  08/01/14 0316  AST 19 18  ALT 15 12  ALKPHOS 96 85  BILITOT 0.4 0.5  PROT 6.3 5.8*  ALBUMIN 2.9* 2.7*    Recent Labs  07/31/14 0117 08/01/14 0316  WBC 10.4 6.7  NEUTROABS 7.5 3.1  HGB 8.4* 8.3*  HCT 25.8* 25.7*  MCV 84.6 88.0  PLT 338 290    Recent Labs  07/31/14 0941  TROPONINI 0.05*   No results for input(s): TSH, T4TOTAL, T3FREE, THYROIDAB in the last 72 hours.  Invalid input(s): FREET3 No results for input(s): VITAMINB12, FOLATE, FERRITIN, TIBC, IRON, RETICCTPCT in the last 72 hours. Lab Results  Component Value Date   INR 0.96 01/09/2014   INR 0.91 07/20/2013   INR 0.95 05/23/2013       Discharge instructions:  01-Home or Self Care Follow-up Information    Follow up with Velna Hatchet, MD On 08/07/2014.   Specialty:  Internal Medicine   Why:  11:15   Contact information:   [place] [address] [phone]       Follow up with DUNHAM,CYNTHIA B, MD In 2 weeks.   Specialty:  Nephrology   Contact information:   [place] [address] [phone]       Follow up with Laverda Page, MD In 3 weeks.   Specialty:  Cardiology   Contact information:   [address] [place] [address] [phone]       Follow up with Angelia Mould, MD On 08/29/2014.   Specialty:  Vascular Surgery   Contact information:   [address] [place] [address] [phone]        Disposition: Home  Follow-up Appts: Follow-up with Dr. Lemmie Evens at [facility] Tuesday Jan 12 @ 11:15.  Condition on Discharge: Much better  Tests Needing Follow-up: BMET, BNP, CBC, BP check  Time spent in discharge (includes decision making & examination of pt): 35 min  Signed: Foy Mungia M 08/01/2014, 9:09 AM

## 2014-08-28 ENCOUNTER — Encounter: Payer: Self-pay | Admitting: Vascular Surgery

## 2014-08-29 ENCOUNTER — Ambulatory Visit (HOSPITAL_COMMUNITY)
Admission: RE | Admit: 2014-08-29 | Discharge: 2014-08-29 | Disposition: A | Discharge status: Home / Self Care | Payer: BC Managed Care – PPO | Source: Ambulatory Visit | Attending: Vascular Surgery | Admitting: Vascular Surgery

## 2014-08-29 ENCOUNTER — Encounter: Payer: Self-pay | Admitting: Vascular Surgery

## 2014-08-29 ENCOUNTER — Other Ambulatory Visit (HOSPITAL_COMMUNITY): Payer: Self-pay

## 2014-08-29 ENCOUNTER — Ambulatory Visit (INDEPENDENT_AMBULATORY_CARE_PROVIDER_SITE_OTHER): Payer: BC Managed Care – PPO | Admitting: Vascular Surgery

## 2014-08-29 VITALS — BP 148/61 | HR 78 | Ht 65.0 in | Wt 139.0 lb

## 2014-08-29 DIAGNOSIS — Z4931 Encounter for adequacy testing for hemodialysis: Secondary | ICD-10-CM

## 2014-08-29 DIAGNOSIS — N186 End stage renal disease: Secondary | ICD-10-CM | POA: Insufficient documentation

## 2014-08-29 DIAGNOSIS — N184 Chronic kidney disease, stage 4 (severe): Secondary | ICD-10-CM

## 2014-08-29 NOTE — Addendum Note (Signed)
Addended by: Mena Goes on: 08/29/2014 03:32 PM   Modules accepted: Orders

## 2014-08-29 NOTE — Progress Notes (Signed)
    Established Dialysis Access  History of Present Illness  Destiny Day is a 64 y.o. ([birth year]) female who presents for evaluation of her AV fistula.  She had a BVT created by Dr. Scot Dock 6 weeks ago.  She is not yet on dialysis.  She reports oi numbness,or decreased motor function in there left hand.    The patient's PMH, PSH, SH, FamHx, Med, and Allergies are unchanged from  Physical Examination  Filed Vitals:   08/29/14 1421  BP: 148/61  Pulse: 78  Height: 5\' 5"  (1.651 m)  Weight: 139 lb (63.05 kg)  SpO2: 99%   Body mass index is 23.13 kg/(m^2).  General:  No acute distress Vascular palpable thrill throughout the left BVT.  Incisions healed well Palpable radial pulse. Lungs CTA Heart RRR  Non-Invasive Vascular Imaging  left arm Access Duplex  (Date: 08/29/2014):   Diameters:  0.48-0.79 cm  Depth:  0.55-0.13 cm  Medical Decision Making  Destiny Day is a 64 y.o. female who presents for post op check of her left BCT fistula She is not yet on dialysis.  We would like to give the fistula more time to mature.  She can perform activities as tolerates and we encourage left upper extremitie use to help the fistula mature.  She will f/u in 6 weeks for a repeat duplex.  We may have to schedule an fistulogram if the vein has not matured on her next visit.  She was seen today in conjunction with dr. Pryor Montes, [place] PA-C [facility]: [phone]  08/29/2014, 2:44 PM

## 2014-08-30 ENCOUNTER — Encounter (HOSPITAL_COMMUNITY)
Admission: RE | Admit: 2014-08-30 | Discharge: 2014-08-30 | Disposition: A | Discharge status: Home / Self Care | Payer: BC Managed Care – PPO | Source: Ambulatory Visit | Attending: Nephrology | Admitting: Nephrology

## 2014-08-30 DIAGNOSIS — Z79899 Other long term (current) drug therapy: Secondary | ICD-10-CM | POA: Insufficient documentation

## 2014-08-30 DIAGNOSIS — N184 Chronic kidney disease, stage 4 (severe): Secondary | ICD-10-CM

## 2014-08-30 DIAGNOSIS — Z5181 Encounter for therapeutic drug level monitoring: Secondary | ICD-10-CM | POA: Insufficient documentation

## 2014-08-30 DIAGNOSIS — D631 Anemia in chronic kidney disease: Secondary | ICD-10-CM | POA: Insufficient documentation

## 2014-08-30 LAB — POCT HEMOGLOBIN-HEMACUE: Hemoglobin: 8.1 g/dL — ABNORMAL LOW (ref 12.0–15.0)

## 2014-08-30 MED ORDER — DARBEPOETIN ALFA 100 MCG/0.5ML IJ SOSY
PREFILLED_SYRINGE | INTRAMUSCULAR | Status: AC
  Administered 2014-08-30: 100 ug via SUBCUTANEOUS
  Filled 2014-08-30: qty 0.5

## 2014-08-30 MED ORDER — SODIUM CHLORIDE 0.9 % IV SOLN
510.0000 mg | INTRAVENOUS | Status: DC
  Administered 2014-08-30: 510 mg via INTRAVENOUS
  Filled 2014-08-30: qty 17

## 2014-08-30 MED ORDER — DARBEPOETIN ALFA 100 MCG/0.5ML IJ SOSY: 100.0000 ug | PREFILLED_SYRINGE | INTRAMUSCULAR | Status: DC

## 2014-08-30 NOTE — Discharge Instructions (Signed)
Ferumoxytol injection What is this medicine? FERUMOXYTOL is an iron complex. Iron is used to make healthy red blood cells, which carry oxygen and nutrients throughout the body. This medicine is used to treat iron deficiency anemia in people with chronic kidney disease. This medicine may be used for other purposes; ask your health care provider or pharmacist if you have questions. COMMON BRAND NAME(S): Feraheme What should I tell my health care provider before I take this medicine? They need to know if you have any of these conditions: -anemia not caused by low iron levels -high levels of iron in the blood -magnetic resonance imaging (MRI) test scheduled -an unusual or allergic reaction to iron, other medicines, foods, dyes, or preservatives -pregnant or trying to get pregnant -breast-feeding How should I use this medicine? This medicine is for injection into a vein. It is given by a health care professional in a hospital or clinic setting. Talk to your pediatrician regarding the use of this medicine in children. Special care may be needed. Overdosage: If you think you've taken too much of this medicine contact a poison control center or emergency room at once. Overdosage: If you think you have taken too much of this medicine contact a poison control center or emergency room at once. NOTE: This medicine is only for you. Do not share this medicine with others. What if I miss a dose? It is important not to miss your dose. Call your doctor or health care professional if you are unable to keep an appointment. What may interact with this medicine? This medicine may interact with the following medications: -other iron products This list may not describe all possible interactions. Give your health care provider a list of all the medicines, herbs, non-prescription drugs, or dietary supplements you use. Also tell them if you smoke, drink alcohol, or use illegal drugs. Some items may interact with your  medicine. What should I watch for while using this medicine? Visit your doctor or healthcare professional regularly. Tell your doctor or healthcare professional if your symptoms do not start to get better or if they get worse. You may need blood work done while you are taking this medicine. You may need to follow a special diet. Talk to your doctor. Foods that contain iron include: whole grains/cereals, dried fruits, beans, or peas, leafy green vegetables, and organ meats (liver, kidney). What side effects may I notice from receiving this medicine? Side effects that you should report to your doctor or health care professional as soon as possible: -allergic reactions like skin rash, itching or hives, swelling of the face, lips, or tongue -breathing problems -changes in blood pressure -feeling faint or lightheaded, falls -fever or chills -flushing, sweating, or hot feelings -swelling of the ankles or feet Side effects that usually do not require medical attention (Report these to your doctor or health care professional if they continue or are bothersome.): -diarrhea -headache -nausea, vomiting -stomach pain This list may not describe all possible side effects. Call your doctor for medical advice about side effects. You may report side effects to FDA at 1-800-FDA-1088. Where should I keep my medicine? This drug is given in a hospital or clinic and will not be stored at home. NOTE: This sheet is a summary. It may not cover all possible information. If you have questions about this medicine, talk to your doctor, pharmacist, or health care provider.  2015, Elsevier/Gold Standard. (2012-02-26 15:23:36)   Darbepoetin Alfa injection What is this medicine? DARBEPOETIN ALFA (dar be POE   e tin AL fa) helps your body make more red blood cells. It is used to treat anemia caused by chronic kidney failure and chemotherapy. This medicine may be used for other purposes; ask your health care provider or  pharmacist if you have questions. COMMON BRAND NAME(S): Aranesp What should I tell my health care provider before I take this medicine? They need to know if you have any of these conditions: -blood clotting disorders or history of blood clots -cancer patient not on chemotherapy -cystic fibrosis -heart disease, such as angina, heart failure, or a history of a heart attack -hemoglobin level of 12 g/dL or greater -high blood pressure -low levels of folate, iron, or vitamin B12 -seizures -an unusual or allergic reaction to darbepoetin, erythropoietin, albumin, hamster proteins, latex, other medicines, foods, dyes, or preservatives -pregnant or trying to get pregnant -breast-feeding How should I use this medicine? This medicine is for injection into a vein or under the skin. It is usually given by a health care professional in a hospital or clinic setting. If you get this medicine at home, you will be taught how to prepare and give this medicine. Do not shake the solution before you withdraw a dose. Use exactly as directed. Take your medicine at regular intervals. Do not take your medicine more often than directed. It is important that you put your used needles and syringes in a special sharps container. Do not put them in a trash can. If you do not have a sharps container, call your pharmacist or healthcare provider to get one. Talk to your pediatrician regarding the use of this medicine in children. While this medicine may be used in children as young as 1 year for selected conditions, precautions do apply. Overdosage: If you think you have taken too much of this medicine contact a poison control center or emergency room at once. NOTE: This medicine is only for you. Do not share this medicine with others. What if I miss a dose? If you miss a dose, take it as soon as you can. If it is almost time for your next dose, take only that dose. Do not take double or extra doses. What may interact with  this medicine? Do not take this medicine with any of the following medications: -epoetin alfa This list may not describe all possible interactions. Give your health care provider a list of all the medicines, herbs, non-prescription drugs, or dietary supplements you use. Also tell them if you smoke, drink alcohol, or use illegal drugs. Some items may interact with your medicine. What should I watch for while using this medicine? Visit your prescriber or health care professional for regular checks on your progress and for the needed blood tests and blood pressure measurements. It is especially important for the doctor to make sure your hemoglobin level is in the desired range, to limit the risk of potential side effects and to give you the best benefit. Keep all appointments for any recommended tests. Check your blood pressure as directed. Ask your doctor what your blood pressure should be and when you should contact him or her. As your body makes more red blood cells, you may need to take iron, folic acid, or vitamin B supplements. Ask your doctor or health care provider which products are right for you. If you have kidney disease continue dietary restrictions, even though this medication can make you feel better. Talk with your doctor or health care professional about the foods you eat and the vitamins that   you take. What side effects may I notice from receiving this medicine? Side effects that you should report to your doctor or health care professional as soon as possible: -allergic reactions like skin rash, itching or hives, swelling of the face, lips, or tongue -breathing problems -changes in vision -chest pain -confusion, trouble speaking or understanding -feeling faint or lightheaded, falls -high blood pressure -muscle aches or pains -pain, swelling, warmth in the leg -rapid weight gain -severe headaches -sudden numbness or weakness of the face, arm or leg -trouble walking, dizziness, loss  of balance or coordination -seizures (convulsions) -swelling of the ankles, feet, hands -unusually weak or tired Side effects that usually do not require medical attention (report to your doctor or health care professional if they continue or are bothersome): -diarrhea -fever, chills (flu-like symptoms) -headaches -nausea, vomiting -redness, stinging, or swelling at site where injected This list may not describe all possible side effects. Call your doctor for medical advice about side effects. You may report side effects to FDA at 1-800-FDA-1088. Where should I keep my medicine? Keep out of the reach of children. Store in a refrigerator between 2 and 8 degrees C (36 and 46 degrees F). Do not freeze. Do not shake. Throw away any unused portion if using a single-dose vial. Throw away any unused medicine after the expiration date. NOTE: This sheet is a summary. It may not cover all possible information. If you have questions about this medicine, talk to your doctor, pharmacist, or health care provider.  2015, Elsevier/Gold Standard. (2008-06-26 10:23:57)  

## 2014-08-31 ENCOUNTER — Emergency Department (HOSPITAL_COMMUNITY): Payer: BC Managed Care – PPO

## 2014-08-31 ENCOUNTER — Encounter (HOSPITAL_COMMUNITY): Payer: Self-pay | Admitting: *Deleted

## 2014-08-31 ENCOUNTER — Inpatient Hospital Stay (HOSPITAL_COMMUNITY)
Admission: EM | Admit: 2014-08-31 | Discharge: 2014-09-05 | DRG: 291 | Disposition: A | Discharge status: Home / Self Care | Payer: BC Managed Care – PPO | Attending: Internal Medicine | Admitting: Internal Medicine

## 2014-08-31 ENCOUNTER — Other Ambulatory Visit: Payer: Self-pay

## 2014-08-31 DIAGNOSIS — M810 Age-related osteoporosis without current pathological fracture: Secondary | ICD-10-CM | POA: Diagnosis present

## 2014-08-31 DIAGNOSIS — J45909 Unspecified asthma, uncomplicated: Secondary | ICD-10-CM | POA: Diagnosis present

## 2014-08-31 DIAGNOSIS — M1990 Unspecified osteoarthritis, unspecified site: Secondary | ICD-10-CM | POA: Diagnosis present

## 2014-08-31 DIAGNOSIS — J9601 Acute respiratory failure with hypoxia: Secondary | ICD-10-CM | POA: Diagnosis present

## 2014-08-31 DIAGNOSIS — Z888 Allergy status to other drugs, medicaments and biological substances status: Secondary | ICD-10-CM

## 2014-08-31 DIAGNOSIS — F419 Anxiety disorder, unspecified: Secondary | ICD-10-CM | POA: Diagnosis present

## 2014-08-31 DIAGNOSIS — Z992 Dependence on renal dialysis: Secondary | ICD-10-CM | POA: Diagnosis not present

## 2014-08-31 DIAGNOSIS — E1121 Type 2 diabetes mellitus with diabetic nephropathy: Secondary | ICD-10-CM | POA: Diagnosis present

## 2014-08-31 DIAGNOSIS — R1110 Vomiting, unspecified: Secondary | ICD-10-CM

## 2014-08-31 DIAGNOSIS — N185 Chronic kidney disease, stage 5: Secondary | ICD-10-CM | POA: Diagnosis present

## 2014-08-31 DIAGNOSIS — I132 Hypertensive heart and chronic kidney disease with heart failure and with stage 5 chronic kidney disease, or end stage renal disease: Principal | ICD-10-CM | POA: Diagnosis present

## 2014-08-31 DIAGNOSIS — I509 Heart failure, unspecified: Secondary | ICD-10-CM

## 2014-08-31 DIAGNOSIS — I160 Hypertensive urgency: Secondary | ICD-10-CM | POA: Diagnosis present

## 2014-08-31 DIAGNOSIS — E1165 Type 2 diabetes mellitus with hyperglycemia: Secondary | ICD-10-CM | POA: Diagnosis present

## 2014-08-31 DIAGNOSIS — R0602 Shortness of breath: Secondary | ICD-10-CM | POA: Diagnosis present

## 2014-08-31 DIAGNOSIS — I5033 Acute on chronic diastolic (congestive) heart failure: Secondary | ICD-10-CM | POA: Diagnosis present

## 2014-08-31 DIAGNOSIS — Z7982 Long term (current) use of aspirin: Secondary | ICD-10-CM

## 2014-08-31 DIAGNOSIS — Z794 Long term (current) use of insulin: Secondary | ICD-10-CM | POA: Diagnosis not present

## 2014-08-31 DIAGNOSIS — K5900 Constipation, unspecified: Secondary | ICD-10-CM | POA: Diagnosis not present

## 2014-08-31 DIAGNOSIS — G4730 Sleep apnea, unspecified: Secondary | ICD-10-CM | POA: Diagnosis present

## 2014-08-31 DIAGNOSIS — Z7951 Long term (current) use of inhaled steroids: Secondary | ICD-10-CM | POA: Diagnosis not present

## 2014-08-31 DIAGNOSIS — Z90710 Acquired absence of both cervix and uterus: Secondary | ICD-10-CM | POA: Diagnosis not present

## 2014-08-31 DIAGNOSIS — D631 Anemia in chronic kidney disease: Secondary | ICD-10-CM | POA: Diagnosis present

## 2014-08-31 DIAGNOSIS — I10 Essential (primary) hypertension: Secondary | ICD-10-CM

## 2014-08-31 DIAGNOSIS — J9600 Acute respiratory failure, unspecified whether with hypoxia or hypercapnia: Secondary | ICD-10-CM | POA: Diagnosis present

## 2014-08-31 DIAGNOSIS — K219 Gastro-esophageal reflux disease without esophagitis: Secondary | ICD-10-CM | POA: Diagnosis present

## 2014-08-31 DIAGNOSIS — I5031 Acute diastolic (congestive) heart failure: Secondary | ICD-10-CM

## 2014-08-31 DIAGNOSIS — E785 Hyperlipidemia, unspecified: Secondary | ICD-10-CM | POA: Diagnosis present

## 2014-08-31 DIAGNOSIS — Z87891 Personal history of nicotine dependence: Secondary | ICD-10-CM

## 2014-08-31 DIAGNOSIS — E1122 Type 2 diabetes mellitus with diabetic chronic kidney disease: Secondary | ICD-10-CM | POA: Diagnosis present

## 2014-08-31 LAB — CBC WITH DIFFERENTIAL/PLATELET
Basophils Absolute: 0 10*3/uL (ref 0.0–0.1)
Basophils Relative: 0 % (ref 0–1)
Eosinophils Absolute: 0.3 10*3/uL (ref 0.0–0.7)
Eosinophils Relative: 2 % (ref 0–5)
HCT: 29.7 % — ABNORMAL LOW (ref 36.0–46.0)
Hemoglobin: 9.8 g/dL — ABNORMAL LOW (ref 12.0–15.0)
Lymphocytes Relative: 20 % (ref 12–46)
Lymphs Abs: 2.2 10*3/uL (ref 0.7–4.0)
MCH: 27.8 pg (ref 26.0–34.0)
MCHC: 33 g/dL (ref 30.0–36.0)
MCV: 84.1 fL (ref 78.0–100.0)
Monocytes Absolute: 0.6 10*3/uL (ref 0.1–1.0)
Monocytes Relative: 6 % (ref 3–12)
Neutro Abs: 7.8 10*3/uL — ABNORMAL HIGH (ref 1.7–7.7)
Neutrophils Relative %: 71 % (ref 43–77)
Platelets: 317 10*3/uL (ref 150–400)
RBC: 3.53 MIL/uL — ABNORMAL LOW (ref 3.87–5.11)
RDW: 13.4 % (ref 11.5–15.5)
WBC: 11 10*3/uL — ABNORMAL HIGH (ref 4.0–10.5)

## 2014-08-31 LAB — BASIC METABOLIC PANEL
Anion gap: 10 (ref 5–15)
BUN: 62 mg/dL — ABNORMAL HIGH (ref 6–23)
CO2: 27 mmol/L (ref 19–32)
Calcium: 9.4 mg/dL (ref 8.4–10.5)
Chloride: 99 mmol/L (ref 96–112)
Creatinine, Ser: 3.26 mg/dL — ABNORMAL HIGH (ref 0.50–1.10)
GFR calc Af Amer: 16 mL/min — ABNORMAL LOW (ref >90)
GFR calc non Af Amer: 14 mL/min — ABNORMAL LOW (ref >90)
Glucose, Bld: 238 mg/dL — ABNORMAL HIGH (ref 70–99)
Potassium: 3.5 mmol/L (ref 3.5–5.1)
Sodium: 136 mmol/L (ref 135–145)

## 2014-08-31 LAB — BRAIN NATRIURETIC PEPTIDE: B Natriuretic Peptide: 713.6 pg/mL — ABNORMAL HIGH (ref 0.0–100.0)

## 2014-08-31 LAB — I-STAT TROPONIN, ED: Troponin i, poc: 0.01 ng/mL (ref 0.00–0.08)

## 2014-08-31 MED ORDER — NITROGLYCERIN IN D5W 200-5 MCG/ML-% IV SOLN
0.0000 ug/min | Freq: Once | INTRAVENOUS | Status: AC
  Administered 2014-08-31: 5 ug/min via INTRAVENOUS
  Filled 2014-08-31: qty 250

## 2014-08-31 MED ORDER — HYDROMORPHONE HCL 1 MG/ML IJ SOLN
1.0000 mg | Freq: Once | INTRAMUSCULAR | Status: AC
  Administered 2014-08-31: 1 mg via INTRAVENOUS
  Filled 2014-08-31: qty 1

## 2014-08-31 MED ORDER — ONDANSETRON HCL 4 MG/2ML IJ SOLN
4.0000 mg | Freq: Once | INTRAMUSCULAR | Status: AC
  Administered 2014-08-31: 4 mg via INTRAVENOUS
  Filled 2014-08-31: qty 2

## 2014-08-31 NOTE — ED Notes (Signed)
Spoke to [place], pt unable to be accepted due to nitro drip.  Paged Dr. Roel Cluck who will speak to EDP about admission and follow up.

## 2014-08-31 NOTE — ED Notes (Signed)
The pt  Has a hx of  Chf..  C/o sudden nionset of sob with n and v.  And ches pain.  According to family these are the symptoms she has with chf

## 2014-08-31 NOTE — ED Provider Notes (Signed)
CSN: [id number]     Arrival date & time 08/31/14  1849 History   First MD Initiated Contact with Patient 08/31/14 1902     Chief Complaint  Patient presents with  . Shortness of Breath     (Consider location/radiation/quality/duration/timing/severity/associated sxs/prior Treatment) HPI 64 year old female past medical history as below notable for CHF, chronic kidney disease who is awaiting initiation of dialysis soon, diabetes, hypertension who presents to ED complaining of one day of gradual worsening shortness of breath and chest pressure. Patient denies having any chest pain states she feels as if she is fluid overloaded. States this feels similar to her CHF exacerbations in the past. Husband accompanies her today says exactly how she presents with CHF. Patient has had nausea and vomiting over the last day. She denies having cough, fever, abdominal pain, diarrhea, urinary symptoms. Patient is taking all her medications as instructed. Patient had AV fistula placement recently in preparation for starting HD. No other complaints at this time. Past Medical History  Diagnosis Date  . Hypertension   . Diverticulitis   . Hyperlipidemia   . Asthma   . Diabetes mellitus     insulin dependent  . Arthritis     knee  . GI bleed 03/31/2013  . CHF (congestive heart failure)   . Osteoporosis   . Seasonal allergies   . Anxiety   . Abdominal bruit   . Chronic kidney disease     stage 3  . Sleep apnea     can't afford cpap  . Pneumonia   . GERD (gastroesophageal reflux disease)     from medications   Past Surgical History  Procedure Laterality Date  . Abdominal hysterectomy  1993`  . Esophagogastroduodenoscopy N/A 03/31/2013    Procedure: ESOPHAGOGASTRODUODENOSCOPY (EGD);  Surgeon: Gatha Mayer, MD;  Location: [facility] ENDOSCOPY;  Service: Endoscopy;  Laterality: N/A;  . Eye surgery      laser surgery  . Bascilic vein transposition Left 07/10/2014    Procedure: BASCILIC VEIN TRANSPOSITION;   Surgeon: Angelia Mould, MD;  Location: [facility] OR;  Service: Vascular;  Laterality: Left;   Family History  Problem Relation Age of Onset  . Colon cancer Neg Hx   . Other Mother     not sure of cause of death  . Diabetes Father   . Pancreatic cancer Maternal Grandmother    History  Substance Use Topics  . Smoking status: Former Smoker -- 0.35 packs/day for 40 years    Types: Cigarettes    Quit date: 05/10/2012  . Smokeless tobacco: Never Used  . Alcohol Use: No   OB History    No data available     Review of Systems  Constitutional: Negative for fever and chills.  HENT: Negative for congestion, rhinorrhea and sore throat.   Eyes: Negative for visual disturbance.  Respiratory: Positive for chest tightness and shortness of breath. Negative for cough.   Cardiovascular: Positive for leg swelling. Negative for chest pain and palpitations.  Gastrointestinal: Positive for nausea and vomiting. Negative for abdominal pain, diarrhea and constipation.  Genitourinary: Negative for dysuria, hematuria, vaginal bleeding and vaginal discharge.  Musculoskeletal: Negative for back pain and neck pain.  Skin: Negative for rash.  Neurological: Negative for weakness and headaches.  All other systems reviewed and are negative.     Allergies  Lisinopril and Prednisone  Home Medications   Prior to Admission medications   Medication Sig Start Date End Date Taking? Authorizing Provider  albuterol (PROVENTIL HFA;VENTOLIN HFA) 108 (  90 BASE) MCG/ACT inhaler Inhale 2 puffs into the lungs every 6 (six) hours as needed for wheezing.    Historical Provider, MD  amLODipine (NORVASC) 10 MG tablet Take 1 tablet (10 mg total) by mouth daily. 07/23/13   Velvet Bathe, MD  aspirin 81 MG chewable tablet Chew 81 mg by mouth daily.    Historical Provider, MD  atorvastatin (LIPITOR) 20 MG tablet Take 20 mg by mouth daily at 6 PM.    Historical Provider, MD  budesonide-formoterol (SYMBICORT) 160-4.5 MCG/ACT  inhaler Inhale 1 puff into the lungs every evening.    Historical Provider, MD  calcitRIOL (ROCALTROL) 0.25 MCG capsule Take 0.25 mcg by mouth daily.    Historical Provider, MD  Calcium Carbonate-Vit D-Min (CALCIUM 600+D PLUS MINERALS) 600-400 MG-UNIT TABS Take 1 tablet by mouth daily.    Historical Provider, MD  carvedilol (COREG) 25 MG tablet Take 1 tablet (25 mg total) by mouth 2 (two) times daily with a meal. 06/01/13   Costin Karlyne Greenspan, MD  cyclobenzaprine (FLEXERIL) 10 MG tablet Take 1 tablet (10 mg total) by mouth 2 (two) times daily as needed for muscle spasms. 05/11/14   Gay Filler Copland, MD  diazepam (VALIUM) 5 MG tablet Take 1 tablet (5 mg total) by mouth every 8 (eight) hours as needed for anxiety (spasms). 05/15/14   Hannah Muthersbaugh, PA-C  escitalopram (LEXAPRO) 10 MG tablet Take 10 mg by mouth daily as needed (ANXIETY).    Historical Provider, MD  famotidine (PEPCID) 20 MG tablet Take 20 mg by mouth daily.    Historical Provider, MD  furosemide (LASIX) 40 MG tablet Take 1.5 tablets (60 mg total) by mouth 2 (two) times daily. Patient taking differently: Take 80 mg by mouth 2 (two) times daily.  01/12/14   Barton Dubois, MD  insulin aspart (NOVOLOG) 100 UNIT/ML injection Inject 0-8 Units into the skin 3 (three) times daily before meals. Sliding scale    Historical Provider, MD  insulin detemir (LEVEMIR) 100 UNIT/ML injection Inject 10 Units into the skin at bedtime.    Historical Provider, MD  irbesartan (AVAPRO) 150 MG tablet Take 1 tablet (150 mg total) by mouth daily. 01/12/14   Barton Dubois, MD  isosorbide-hydrALAZINE (BIDIL) 20-37.5 MG per tablet Take 2 tablets by mouth 3 (three) times daily. 01/12/14   Barton Dubois, MD  loratadine (CLARITIN) 10 MG tablet Take 10 mg by mouth daily as needed for allergies.    Historical Provider, MD  metolazone (ZAROXOLYN) 5 MG tablet Take 1 tablet by mouth as needed (urination).  01/17/14   Historical Provider, MD  ondansetron (ZOFRAN ODT) 4 MG  disintegrating tablet 4mg  ODT q4 hours prn nausea/vomit 05/01/14   Pamella Pert, MD  oxyCODONE-acetaminophen (PERCOCET) 5-325 MG per tablet Take 1-2 tablets by mouth every 6 (six) hours as needed for moderate pain. 05/01/14   Pamella Pert, MD  potassium chloride SA (K-DUR,KLOR-CON) 20 MEQ tablet Take 20 mEq by mouth 2 (two) times daily.    Historical Provider, MD  traMADol (ULTRAM) 50 MG tablet Take 50 mg by mouth every 6 (six) hours as needed (pain).  04/25/14   Historical Provider, MD  Vitamin D, Ergocalciferol, (DRISDOL) 50000 UNITS CAPS capsule Take 50,000 Units by mouth every 7 (seven) days. On wednesday    Historical Provider, MD   BP 241/94 mmHg  Pulse 100  Resp 21  Ht 5\' 5"  (1.651 m)  Wt 139 lb (63.05 kg)  BMI 23.13 kg/m2  SpO2 94% Physical Exam  Constitutional: She  is oriented to person, place, and time. She appears well-developed and well-nourished. She appears distressed.  HENT:  Head: Normocephalic and atraumatic.  Eyes: Conjunctivae are normal.  Neck: Normal range of motion.  Cardiovascular: Normal rate, regular rhythm, normal heart sounds and intact distal pulses.   No murmur heard. Pulmonary/Chest: Accessory muscle usage present. Tachypnea noted. She is in respiratory distress. She has no wheezes. She has rales (diffusely). She exhibits no tenderness.  Abdominal: Soft. Bowel sounds are normal. She exhibits no distension.  Musculoskeletal: Normal range of motion. She exhibits edema (2+ BL lower extremities).  Neurological: She is alert and oriented to person, place, and time. No cranial nerve deficit.  Skin: Skin is warm and dry. She is not diaphoretic.  Psychiatric: She has a normal mood and affect.  Nursing note and vitals reviewed.   ED Course  Procedures (including critical care time) Labs Review Labs Reviewed  CBC WITH DIFFERENTIAL/PLATELET - Abnormal; Notable for the following:    WBC 11.0 (*)    RBC 3.53 (*)    Hemoglobin 9.8 (*)    HCT 29.7 (*)     Neutro Abs 7.8 (*)    All other components within normal limits  BASIC METABOLIC PANEL - Abnormal; Notable for the following:    Glucose, Bld 238 (*)    BUN 62 (*)    Creatinine, Ser 3.26 (*)    GFR calc non Af Amer 14 (*)    GFR calc Af Amer 16 (*)    All other components within normal limits  BRAIN NATRIURETIC PEPTIDE - Abnormal; Notable for the following:    B Natriuretic Peptide 713.6 (*)    All other components within normal limits  I-STAT TROPOININ, ED    Imaging Review Dg Chest Portable 1 View  08/31/2014   CLINICAL DATA:  Chest pain and shortness of breath for 2 hr.  EXAM: PORTABLE CHEST - 1 VIEW  COMPARISON:  07/31/2014  FINDINGS: The heart is mildly enlarged but stable. Slightly prominent mediastinal and hilar contours are unchanged. There is tortuosity and calcification of the thoracic aorta. There is central vascular congestion and moderate pulmonary edema. No definite pleural effusions. The bony thorax is intact.  IMPRESSION: Congestive heart failure.   Electronically Signed   By: Kalman Jewels M.D.   On: 08/31/2014 20:36     EKG Interpretation None      MDM   Final diagnoses:  None    Destiny Day is a 64 y.o. female with H&P as above.patient presents with shortness of breathand fluid overload. Patient has crackles diffusely on exam. She has increased O2 requirement. Slight increased work of breathing. EKG shows ST, NSTWA and otherwise unchanged from prior tracing.Workup notable for CHF exacerbation. Patient was started on nitro drip while in ED. Patient will be admitted to hospitalist for further management of her care.  Pt seen in conjunction with Dr. Thresa Ross, [facility] Resident - PGY-2      Kirstie Peri, MD 08/31/14 [id number]  Mariea Clonts, MD 09/01/14 [phone]

## 2014-08-31 NOTE — ED Notes (Signed)
Dr Reather Converse made aware of Destiny Day BP - 241/94

## 2014-08-31 NOTE — H&P (Signed)
PCP:  Velna Hatchet, MD  [facility] Einar Gip  Chief Complaint:  Shortness of breath  HPI: Destiny Day is a 64 y.o. female   has a past medical history of Hypertension; Diverticulitis; Hyperlipidemia; Asthma; Diabetes mellitus; Arthritis; GI bleed (03/31/2013); CHF (congestive heart failure); Osteoporosis; Seasonal allergies; Anxiety; Abdominal bruit; Chronic kidney disease; Sleep apnea; Pneumonia; and GERD (gastroesophageal reflux disease).   Presented with  This afternoon she started to have worsening shortness of breath. No chest pain but chest tightness. She states she has been taking all her medications. She have not had any leg swelling, no dietery discrepancies. She is unsure of her dose of Lasix. Patient presented to emergency department was found to have blood pressure 200s over 90s. She was acutely short of breath. Chest x-ray taken suggestive of congestive heart failure. Last echo was done in June 2015 showing EF of 50-55% and grade 3 diastolic dysfunction. She is on Lasix 40 twice a day but it's unclear if has been increased to 60 twice a day she is also supposed and metolazone was needed but hasn't been taking. She endorses some nausea vomiting. Chest tightness which is typical for CHF exacerbations. IN ER she was started on nitro drip. Her blood pressure has improved down to 160s/90.   Hospitalist was called for admission for CHF exacerbation  Review of Systems:    Pertinent positives include:  nausea, vomiting, shortness of breath at rest, dyspnea on exertion,  Constitutional:  No weight loss, night sweats, Fevers, chills, fatigue, weight loss  HEENT:  No headaches, Difficulty swallowing,Tooth/dental problems,Sore throat,  No sneezing, itching, ear ache, nasal congestion, post nasal drip,  Cardio-vascular:  No chest pain, Orthopnea, PND, anasarca, dizziness, palpitations.no Bilateral lower extremity swelling  GI:  No heartburn, indigestion, abdominal  pain,  diarrhea, change in bowel habits, loss of appetite, melena, blood in stool, hematemesis Resp:  no . No  No excess mucus, no productive cough, No non-productive cough, No coughing up of blood.No change in color of mucus.No wheezing. Skin:  no rash or lesions. No jaundice GU:  no dysuria, change in color of urine, no urgency or frequency. No straining to urinate.  No flank pain.  Musculoskeletal:  No joint pain or no joint swelling. No decreased range of motion. No back pain.  Psych:  No change in mood or affect. No depression or anxiety. No memory loss.  Neuro: no localizing neurological complaints, no tingling, no weakness, no double vision, no gait abnormality, no slurred speech, no confusion  Otherwise ROS are negative except for above, 10 systems were reviewed  Past Medical History: Past Medical History  Diagnosis Date  . Hypertension   . Diverticulitis   . Hyperlipidemia   . Asthma   . Diabetes mellitus     insulin dependent  . Arthritis     knee  . GI bleed 03/31/2013  . CHF (congestive heart failure)   . Osteoporosis   . Seasonal allergies   . Anxiety   . Abdominal bruit   . Chronic kidney disease     stage 3  . Sleep apnea     can't afford cpap  . Pneumonia   . GERD (gastroesophageal reflux disease)     from medications   Past Surgical History  Procedure Laterality Date  . Abdominal hysterectomy  1993`  . Esophagogastroduodenoscopy N/A 03/31/2013    Procedure: ESOPHAGOGASTRODUODENOSCOPY (EGD);  Surgeon: Gatha Mayer, MD;  Location: [facility] ENDOSCOPY;  Service: Endoscopy;  Laterality: N/A;  .  Eye surgery      laser surgery  . Bascilic vein transposition Left 07/10/2014    Procedure: BASCILIC VEIN TRANSPOSITION;  Surgeon: Angelia Mould, MD;  Location: [place];  Service: Vascular;  Laterality: Left;     Medications: Prior to Admission medications   Medication Sig Start Date End Date Taking? Authorizing Provider  amLODipine (NORVASC) 10 MG tablet  Take 1 tablet (10 mg total) by mouth daily. 07/23/13  Yes Velvet Bathe, MD  aspirin 81 MG chewable tablet Chew 81 mg by mouth daily.   Yes Historical Provider, MD  atorvastatin (LIPITOR) 20 MG tablet Take 20 mg by mouth daily at 6 PM.   Yes Historical Provider, MD  budesonide-formoterol (SYMBICORT) 160-4.5 MCG/ACT inhaler Inhale 1 puff into the lungs every evening.   Yes Historical Provider, MD  calcitRIOL (ROCALTROL) 0.25 MCG capsule Take 0.25 mcg by mouth daily.   Yes Historical Provider, MD  carvedilol (COREG) 25 MG tablet Take 1 tablet (25 mg total) by mouth 2 (two) times daily with a meal. 06/01/13  Yes Costin Karlyne Greenspan, MD  famotidine (PEPCID) 20 MG tablet Take 20 mg by mouth daily.   Yes Historical Provider, MD  furosemide (LASIX) 40 MG tablet Take 1.5 tablets (60 mg total) by mouth 2 (two) times daily. Patient taking differently: Take 40 mg by mouth 2 (two) times daily.  01/12/14  Yes Barton Dubois, MD  insulin aspart (NOVOLOG) 100 UNIT/ML injection Inject 0-8 Units into the skin 3 (three) times daily before meals. Sliding scale   Yes Historical Provider, MD  insulin detemir (LEVEMIR) 100 UNIT/ML injection Inject 10 Units into the skin at bedtime.   Yes Historical Provider, MD  irbesartan (AVAPRO) 150 MG tablet Take 1 tablet (150 mg total) by mouth daily. 01/12/14  Yes Barton Dubois, MD  isosorbide-hydrALAZINE (BIDIL) 20-37.5 MG per tablet Take 2 tablets by mouth 3 (three) times daily. 01/12/14  Yes Barton Dubois, MD  metolazone (ZAROXOLYN) 5 MG tablet Take 1 tablet by mouth as needed (urination).  01/17/14  Yes Historical Provider, MD  oxyCODONE-acetaminophen (PERCOCET) 5-325 MG per tablet Take 1-2 tablets by mouth every 6 (six) hours as needed for moderate pain. 05/01/14  Yes Pamella Pert, MD  potassium chloride SA (K-DUR,KLOR-CON) 20 MEQ tablet Take 20 mEq by mouth 2 (two) times daily.   Yes Historical Provider, MD  Vitamin D, Ergocalciferol, (DRISDOL) 50000 UNITS CAPS capsule Take 50,000  Units by mouth every 7 (seven) days. On wednesday   Yes Historical Provider, MD  cyclobenzaprine (FLEXERIL) 10 MG tablet Take 1 tablet (10 mg total) by mouth 2 (two) times daily as needed for muscle spasms. 05/11/14   Gay Filler Copland, MD  diazepam (VALIUM) 5 MG tablet Take 1 tablet (5 mg total) by mouth every 8 (eight) hours as needed for anxiety (spasms). 05/15/14   Hannah Muthersbaugh, PA-C  ondansetron (ZOFRAN ODT) 4 MG disintegrating tablet 4mg  ODT q4 hours prn nausea/vomit 05/01/14   Pamella Pert, MD    Allergies:   Allergies  Allergen Reactions  . Lisinopril Cough  . Prednisone Other (See Comments)    Excessive fluid buildup    Social History:  Ambulatory   independently or cane,  Lives at home   With family     reports that she quit smoking about 2 years ago. Her smoking use included Cigarettes. She has a 14 pack-year smoking history. She has never used smokeless tobacco. She reports that she uses illicit drugs. She reports that she does not drink  alcohol.    Family History: family history includes Diabetes in her father; Other in her mother; Pancreatic cancer in her maternal grandmother. There is no history of Colon cancer.    Physical Exam: Patient Vitals for the past 24 hrs:  BP Pulse Resp SpO2 Height Weight  08/31/14 2205 172/72 mmHg 87 17 97 % - -  08/31/14 2155 183/77 mmHg 89 20 99 % - -  08/31/14 2135 179/74 mmHg 92 21 97 % - -  08/31/14 2130 178/71 mmHg 88 17 93 % - -  08/31/14 2125 181/79 mmHg 90 15 98 % - -  08/31/14 2120 176/77 mmHg 93 17 94 % - -  08/31/14 2115 178/77 mmHg 94 19 97 % - -  08/31/14 2110 185/77 mmHg 93 21 93 % - -  08/31/14 2105 176/71 mmHg 92 14 96 % - -  08/31/14 2100 182/76 mmHg 94 20 95 % - -  08/31/14 2055 192/84 mmHg 94 22 94 % - -  08/31/14 2050 195/80 mmHg 96 25 93 % - -  08/31/14 2045 (!) 211/86 mmHg 97 21 95 % - -  08/31/14 2040 191/72 mmHg 92 26 95 % - -  08/31/14 2035 190/71 mmHg 93 (!) 27 97 % - -  08/31/14 2030 195/70  mmHg 93 21 93 % - -  08/31/14 2025 200/79 mmHg 96 26 90 % - -  08/31/14 2020 (!) 164/117 mmHg 96 20 94 % - -  08/31/14 2017 199/72 mmHg 97 20 95 % - -  08/31/14 2005 (!) 214/76 mmHg 97 (!) 28 94 % - -  08/31/14 1915 (!) 241/94 mmHg 100 21 94 % - -  08/31/14 1856 (!) 185/119 mmHg 101 26 91 % 5\' 5"  (1.651 m) 63.05 kg (139 lb)    1. General:  in No Acute distress 2. Psychological: Alert and  Oriented 3. Head/ENT:   Moist  Mucous Membranes                          Head Non traumatic, neck supple                          Normal  Dentition 4. SKIN: normal   Skin turgor,  Skin clean Dry and intact no rash 5. Heart: Regular rate and rhythm no Murmur, Rub or gallop 6. Lungs: no wheezes some crackles   7. Abdomen: Soft, non-tender, Non distended 8. Lower extremities: no clubbing, cyanosis, or edema 9. Neurologically Grossly intact, moving all 4 extremities equally 10. MSK: Normal range of motion  body mass index is 23.13 kg/(m^2).   Labs on Admission:   Results for orders placed or performed during the hospital encounter of 08/31/14 (from the past 24 hour(s))  CBC with Differential     Status: Abnormal   Collection Time: 08/31/14  7:40 PM  Result Value Ref Range   WBC 11.0 (H) 4.0 - 10.5 K/uL   RBC 3.53 (L) 3.87 - 5.11 MIL/uL   Hemoglobin 9.8 (L) 12.0 - 15.0 g/dL   HCT 29.7 (L) 36.0 - 46.0 %   MCV 84.1 78.0 - 100.0 fL   MCH 27.8 26.0 - 34.0 pg   MCHC 33.0 30.0 - 36.0 g/dL   RDW 13.4 11.5 - 15.5 %   Platelets 317 150 - 400 K/uL   Neutrophils Relative % 71 43 - 77 %   Neutro Abs 7.8 (H) 1.7 - 7.7 K/uL  Lymphocytes Relative 20 12 - 46 %   Lymphs Abs 2.2 0.7 - 4.0 K/uL   Monocytes Relative 6 3 - 12 %   Monocytes Absolute 0.6 0.1 - 1.0 K/uL   Eosinophils Relative 2 0 - 5 %   Eosinophils Absolute 0.3 0.0 - 0.7 K/uL   Basophils Relative 0 0 - 1 %   Basophils Absolute 0.0 0.0 - 0.1 K/uL  Basic metabolic panel     Status: Abnormal   Collection Time: 08/31/14  7:40 PM  Result Value  Ref Range   Sodium 136 135 - 145 mmol/L   Potassium 3.5 3.5 - 5.1 mmol/L   Chloride 99 96 - 112 mmol/L   CO2 27 19 - 32 mmol/L   Glucose, Bld 238 (H) 70 - 99 mg/dL   BUN 62 (H) 6 - 23 mg/dL   Creatinine, Ser 3.26 (H) 0.50 - 1.10 mg/dL   Calcium 9.4 8.4 - 10.5 mg/dL   GFR calc non Af Amer 14 (L) >90 mL/min   GFR calc Af Amer 16 (L) >90 mL/min   Anion gap 10 5 - 15  Brain natriuretic peptide     Status: Abnormal   Collection Time: 08/31/14  7:41 PM  Result Value Ref Range   B Natriuretic Peptide 713.6 (H) 0.0 - 100.0 pg/mL  I-Stat Troponin, ED (not at [facility])     Status: None   Collection Time: 08/31/14  7:51 PM  Result Value Ref Range   Troponin i, poc 0.01 0.00 - 0.08 ng/mL   Comment 3            UA not obtained  Lab Results  Component Value Date   HGBA1C 9.2* 07/31/2014    Estimated Creatinine Clearance: 15.9 mL/min (by C-G formula based on Cr of 3.26).  BNP (last 3 results)  Recent Labs  01/09/14 2320 01/16/14 2132 03/22/14 2338  PROBNP 20261.0* 6445.0* 3101.0*    Other results:  I have pearsonaly reviewed this: ECG REPORT  Rate: 103  Rhythm: ST ST&T Change: no ischemic changes   Filed Weights   08/31/14 1856  Weight: 63.05 kg (139 lb)     Cultures:    Component Value Date/Time   SDES BLOOD LEFT HAND 01/10/2014 0020   SPECREQUEST BOTTLES DRAWN AEROBIC ONLY 5CC 01/10/2014 0020   CULT  01/10/2014 0020    NO GROWTH 5 DAYS Performed at [place] 01/16/2014 FINAL 01/10/2014 0020     Radiological Exams on Admission: Dg Chest Portable 1 View  08/31/2014   CLINICAL DATA:  Chest pain and shortness of breath for 2 hr.  EXAM: PORTABLE CHEST - 1 VIEW  COMPARISON:  07/31/2014  FINDINGS: The heart is mildly enlarged but stable. Slightly prominent mediastinal and hilar contours are unchanged. There is tortuosity and calcification of the thoracic aorta. There is central vascular congestion and moderate pulmonary edema. No definite pleural  effusions. The bony thorax is intact.  IMPRESSION: Congestive heart failure.   Electronically Signed   By: Kalman Jewels M.D.   On: 08/31/2014 20:36    Chart has been reviewed  Assessment/Plan  64 year old female history of diastolic heart failure chronic kidney disease status post fistula to left arm not on hemodialysis yet with history of poorly controlled hypertension and diabetes presents with shortness of breath and evidence of CHF and hypertensive urgency  Present on Admission:  . Hypertensive urgency - mid to step down on nitro drip start home medications  . Acute respiratory failure -  secondary to CHF currently improved continue with nitro drip to control blood pressure and Lasix  Acute exacerbation of diastolic heart failure  - - admit on telemetry, cycle cardiac enzymes, obtain serial ECG, to evaluate for ischemia as a cause of heart failure  monitor daily weight  diurese with IV lasix and monitor orthostatics and creatinine to avoid over diuresis.  Order echogram to evaluate EF and valves Make sure patient is on ACE/ARBi  . Asthma - albuterol as needed   . Type 2 diabetes mellitus with diabetic nephropathy - sensitive sliding scale continue home insulin   Prophylaxis:   Lovenox, Protonix  CODE STATUS:  FULL CODE    Other plan as per orders.  I have spent a total of  55 min on this admission  Surina Storts 08/31/2014, 10:49 PM  [facility]  Pager [phone]   after 2 AM please page floor coverage PA If 7AM-7PM, please contact the day team taking care of the patient  [email or web address]  Password [email or web address]

## 2014-09-01 LAB — COMPREHENSIVE METABOLIC PANEL
ALT: 16 U/L (ref 0–35)
AST: 20 U/L (ref 0–37)
Albumin: 3 g/dL — ABNORMAL LOW (ref 3.5–5.2)
Alkaline Phosphatase: 90 U/L (ref 39–117)
Anion gap: 9 (ref 5–15)
BUN: 60 mg/dL — ABNORMAL HIGH (ref 6–23)
CO2: 28 mmol/L (ref 19–32)
Calcium: 8.8 mg/dL (ref 8.4–10.5)
Chloride: 99 mmol/L (ref 96–112)
Creatinine, Ser: 3.31 mg/dL — ABNORMAL HIGH (ref 0.50–1.10)
GFR calc Af Amer: 16 mL/min — ABNORMAL LOW (ref >90)
GFR calc non Af Amer: 14 mL/min — ABNORMAL LOW (ref >90)
Glucose, Bld: 155 mg/dL — ABNORMAL HIGH (ref 70–99)
Potassium: 3.3 mmol/L — ABNORMAL LOW (ref 3.5–5.1)
Sodium: 136 mmol/L (ref 135–145)
Total Bilirubin: 0.5 mg/dL (ref 0.3–1.2)
Total Protein: 6.7 g/dL (ref 6.0–8.3)

## 2014-09-01 LAB — CBC WITH DIFFERENTIAL/PLATELET
Basophils Absolute: 0 10*3/uL (ref 0.0–0.1)
Basophils Relative: 0 % (ref 0–1)
Eosinophils Absolute: 0.2 10*3/uL (ref 0.0–0.7)
Eosinophils Relative: 2 % (ref 0–5)
HCT: 25.2 % — ABNORMAL LOW (ref 36.0–46.0)
Hemoglobin: 8.2 g/dL — ABNORMAL LOW (ref 12.0–15.0)
Lymphocytes Relative: 30 % (ref 12–46)
Lymphs Abs: 3.2 10*3/uL (ref 0.7–4.0)
MCH: 27.8 pg (ref 26.0–34.0)
MCHC: 32.5 g/dL (ref 30.0–36.0)
MCV: 85.4 fL (ref 78.0–100.0)
Monocytes Absolute: 0.7 10*3/uL (ref 0.1–1.0)
Monocytes Relative: 7 % (ref 3–12)
Neutro Abs: 6.5 10*3/uL (ref 1.7–7.7)
Neutrophils Relative %: 61 % (ref 43–77)
Platelets: 276 10*3/uL (ref 150–400)
RBC: 2.95 MIL/uL — ABNORMAL LOW (ref 3.87–5.11)
RDW: 13.4 % (ref 11.5–15.5)
WBC: 10.6 10*3/uL — ABNORMAL HIGH (ref 4.0–10.5)

## 2014-09-01 LAB — CBC
HCT: 25.6 % — ABNORMAL LOW (ref 36.0–46.0)
HCT: 24.2 % — ABNORMAL LOW (ref 36.0–46.0)
Hemoglobin: 8.3 g/dL — ABNORMAL LOW (ref 12.0–15.0)
Hemoglobin: 7.8 g/dL — ABNORMAL LOW (ref 12.0–15.0)
MCH: 27.5 pg (ref 26.0–34.0)
MCH: 27.5 pg (ref 26.0–34.0)
MCHC: 32.4 g/dL (ref 30.0–36.0)
MCHC: 32.2 g/dL (ref 30.0–36.0)
MCV: 84.8 fL (ref 78.0–100.0)
MCV: 85.2 fL (ref 78.0–100.0)
Platelets: 252 10*3/uL (ref 150–400)
Platelets: 272 10*3/uL (ref 150–400)
RBC: 3.02 MIL/uL — ABNORMAL LOW (ref 3.87–5.11)
RBC: 2.84 MIL/uL — ABNORMAL LOW (ref 3.87–5.11)
RDW: 13.4 % (ref 11.5–15.5)
RDW: 13.4 % (ref 11.5–15.5)
WBC: 10.9 10*3/uL — ABNORMAL HIGH (ref 4.0–10.5)
WBC: 9.5 10*3/uL (ref 4.0–10.5)

## 2014-09-01 LAB — RETICULOCYTES
RBC.: 2.84 MIL/uL — ABNORMAL LOW (ref 3.87–5.11)
Retic Count, Absolute: 45.4 10*3/uL (ref 19.0–186.0)
Retic Ct Pct: 1.6 % (ref 0.4–3.1)

## 2014-09-01 LAB — IRON AND TIBC
Iron: 313 ug/dL — ABNORMAL HIGH (ref 42–145)
Saturation Ratios: 72 % — ABNORMAL HIGH (ref 20–55)
TIBC: 433 ug/dL (ref 250–470)
UIBC: 120 ug/dL — ABNORMAL LOW (ref 125–400)

## 2014-09-01 LAB — GLUCOSE, CAPILLARY
Glucose-Capillary: 132 mg/dL — ABNORMAL HIGH (ref 70–99)
Glucose-Capillary: 115 mg/dL — ABNORMAL HIGH (ref 70–99)
Glucose-Capillary: 206 mg/dL — ABNORMAL HIGH (ref 70–99)
Glucose-Capillary: 89 mg/dL (ref 70–99)

## 2014-09-01 LAB — CBG MONITORING, ED: Glucose-Capillary: 162 mg/dL — ABNORMAL HIGH (ref 70–99)

## 2014-09-01 LAB — MRSA PCR SCREENING: MRSA by PCR: NEGATIVE

## 2014-09-01 LAB — VITAMIN B12: Vitamin B-12: 465 pg/mL (ref 211–911)

## 2014-09-01 LAB — FOLATE: Folate: 18.5 ng/mL

## 2014-09-01 LAB — CREATININE, SERUM
Creatinine, Ser: 3.3 mg/dL — ABNORMAL HIGH (ref 0.50–1.10)
GFR calc Af Amer: 16 mL/min — ABNORMAL LOW (ref >90)
GFR calc non Af Amer: 14 mL/min — ABNORMAL LOW (ref >90)

## 2014-09-01 LAB — PREPARE RBC (CROSSMATCH)

## 2014-09-01 LAB — TROPONIN I
Troponin I: 0.03 ng/mL (ref <0.031)
Troponin I: 0.04 ng/mL — ABNORMAL HIGH (ref <0.031)
Troponin I: 0.04 ng/mL — ABNORMAL HIGH (ref <0.031)

## 2014-09-01 LAB — FERRITIN: Ferritin: 280 ng/mL (ref 10–291)

## 2014-09-01 MED ORDER — POTASSIUM CHLORIDE CRYS ER 20 MEQ PO TBCR
20.0000 meq | EXTENDED_RELEASE_TABLET | Freq: Two times a day (BID) | ORAL | Status: DC
  Administered 2014-09-01 – 2014-09-05 (×8): 20 meq via ORAL
  Filled 2014-09-01 (×10): qty 1

## 2014-09-01 MED ORDER — ASPIRIN 81 MG PO CHEW
81.0000 mg | CHEWABLE_TABLET | Freq: Every day | ORAL | Status: DC
  Administered 2014-09-01 – 2014-09-05 (×4): 81 mg via ORAL
  Filled 2014-09-01 (×4): qty 1

## 2014-09-01 MED ORDER — LABETALOL HCL 5 MG/ML IV SOLN
15.0000 mg | Freq: Four times a day (QID) | INTRAVENOUS | Status: DC
  Administered 2014-09-01 – 2014-09-03 (×8): 15 mg via INTRAVENOUS
  Filled 2014-09-01 (×13): qty 4

## 2014-09-01 MED ORDER — SODIUM CHLORIDE 0.9 % IV SOLN: 250.0000 mL | INTRAVENOUS | Status: DC | PRN

## 2014-09-01 MED ORDER — FAMOTIDINE 20 MG PO TABS
20.0000 mg | ORAL_TABLET | Freq: Every day | ORAL | Status: DC
  Administered 2014-09-01 – 2014-09-05 (×4): 20 mg via ORAL
  Filled 2014-09-01 (×5): qty 1

## 2014-09-01 MED ORDER — NITROGLYCERIN IN D5W 200-5 MCG/ML-% IV SOLN
0.0000 ug/min | Freq: Once | INTRAVENOUS | Status: AC
  Administered 2014-09-01: 100 ug/min via INTRAVENOUS
  Filled 2014-09-01: qty 250

## 2014-09-01 MED ORDER — METOLAZONE 5 MG PO TABS
5.0000 mg | ORAL_TABLET | Freq: Every day | ORAL | Status: DC | PRN
  Filled 2014-09-01: qty 1

## 2014-09-01 MED ORDER — FUROSEMIDE 10 MG/ML IJ SOLN: 60.0000 mg | Freq: Once | INTRAMUSCULAR | Status: DC

## 2014-09-01 MED ORDER — AMLODIPINE BESYLATE 10 MG PO TABS
10.0000 mg | ORAL_TABLET | Freq: Every day | ORAL | Status: DC
  Administered 2014-09-01 – 2014-09-05 (×4): 10 mg via ORAL
  Filled 2014-09-01 (×5): qty 1

## 2014-09-01 MED ORDER — INSULIN DETEMIR 100 UNIT/ML ~~LOC~~ SOLN
10.0000 [IU] | Freq: Every day | SUBCUTANEOUS | Status: DC
  Administered 2014-09-01 – 2014-09-04 (×5): 10 [IU] via SUBCUTANEOUS
  Filled 2014-09-01 (×6): qty 0.1

## 2014-09-01 MED ORDER — CALCITRIOL 0.25 MCG PO CAPS
0.2500 ug | ORAL_CAPSULE | Freq: Every day | ORAL | Status: DC
  Administered 2014-09-01 – 2014-09-05 (×4): 0.25 ug via ORAL
  Filled 2014-09-01 (×5): qty 1

## 2014-09-01 MED ORDER — HYDRALAZINE HCL 20 MG/ML IJ SOLN
10.0000 mg | INTRAMUSCULAR | Status: DC | PRN
  Administered 2014-09-02 – 2014-09-04 (×5): 10 mg via INTRAVENOUS
  Filled 2014-09-01 (×5): qty 1

## 2014-09-01 MED ORDER — ATORVASTATIN CALCIUM 20 MG PO TABS
20.0000 mg | ORAL_TABLET | Freq: Every day | ORAL | Status: DC
  Administered 2014-09-01 – 2014-09-04 (×3): 20 mg via ORAL
  Filled 2014-09-01 (×5): qty 1

## 2014-09-01 MED ORDER — BUDESONIDE-FORMOTEROL FUMARATE 160-4.5 MCG/ACT IN AERO
1.0000 | INHALATION_SPRAY | Freq: Every evening | RESPIRATORY_TRACT | Status: DC
  Administered 2014-09-01 – 2014-09-04 (×4): 1 via RESPIRATORY_TRACT
  Filled 2014-09-01: qty 6

## 2014-09-01 MED ORDER — SENNOSIDES-DOCUSATE SODIUM 8.6-50 MG PO TABS
2.0000 | ORAL_TABLET | Freq: Every evening | ORAL | Status: DC | PRN
  Filled 2014-09-01: qty 2

## 2014-09-01 MED ORDER — INSULIN ASPART 100 UNIT/ML ~~LOC~~ SOLN
0.0000 [IU] | Freq: Three times a day (TID) | SUBCUTANEOUS | Status: DC
  Administered 2014-09-01: 1 [IU] via SUBCUTANEOUS
  Administered 2014-09-01: 3 [IU] via SUBCUTANEOUS
  Administered 2014-09-03: 2 [IU] via SUBCUTANEOUS
  Administered 2014-09-03 – 2014-09-04 (×2): 1 [IU] via SUBCUTANEOUS

## 2014-09-01 MED ORDER — ENOXAPARIN SODIUM 30 MG/0.3ML ~~LOC~~ SOLN
30.0000 mg | Freq: Every day | SUBCUTANEOUS | Status: DC
  Administered 2014-09-01 – 2014-09-05 (×4): 30 mg via SUBCUTANEOUS
  Filled 2014-09-01 (×5): qty 0.3

## 2014-09-01 MED ORDER — CARVEDILOL 25 MG PO TABS
25.0000 mg | ORAL_TABLET | Freq: Two times a day (BID) | ORAL | Status: DC
  Filled 2014-09-01 (×3): qty 1

## 2014-09-01 MED ORDER — DARBEPOETIN ALFA 40 MCG/0.4ML IJ SOSY: 40.0000 ug | PREFILLED_SYRINGE | INTRAMUSCULAR | Status: DC

## 2014-09-01 MED ORDER — INSULIN ASPART 100 UNIT/ML ~~LOC~~ SOLN
0.0000 [IU] | Freq: Every day | SUBCUTANEOUS | Status: DC
  Administered 2014-09-04: 2 [IU] via SUBCUTANEOUS

## 2014-09-01 MED ORDER — FERROUS SULFATE 325 (65 FE) MG PO TABS
325.0000 mg | ORAL_TABLET | Freq: Three times a day (TID) | ORAL | Status: DC
  Administered 2014-09-01 (×2): 325 mg via ORAL
  Filled 2014-09-01 (×6): qty 1

## 2014-09-01 MED ORDER — ISOSORB DINITRATE-HYDRALAZINE 20-37.5 MG PO TABS
2.0000 | ORAL_TABLET | Freq: Three times a day (TID) | ORAL | Status: DC
  Administered 2014-09-01 – 2014-09-05 (×12): 2 via ORAL
  Filled 2014-09-01 (×15): qty 2

## 2014-09-01 MED ORDER — SODIUM CHLORIDE 0.9 % IJ SOLN: 3.0000 mL | INTRAMUSCULAR | Status: DC | PRN

## 2014-09-01 MED ORDER — ONDANSETRON HCL 4 MG/2ML IJ SOLN
4.0000 mg | Freq: Four times a day (QID) | INTRAMUSCULAR | Status: DC | PRN
  Administered 2014-09-02: 4 mg via INTRAVENOUS
  Filled 2014-09-01: qty 2

## 2014-09-01 MED ORDER — IRBESARTAN 150 MG PO TABS
150.0000 mg | ORAL_TABLET | Freq: Every day | ORAL | Status: DC
  Administered 2014-09-01: 150 mg via ORAL
  Filled 2014-09-01 (×2): qty 1

## 2014-09-01 MED ORDER — DARBEPOETIN ALFA 40 MCG/0.4ML IJ SOSY
40.0000 ug | PREFILLED_SYRINGE | INTRAMUSCULAR | Status: DC
Start: 2014-09-02 — End: 2014-09-01

## 2014-09-01 MED ORDER — ACETAMINOPHEN 325 MG PO TABS: 650.0000 mg | ORAL_TABLET | ORAL | Status: DC | PRN

## 2014-09-01 MED ORDER — SODIUM CHLORIDE 0.9 % IV SOLN
Freq: Once | INTRAVENOUS | Status: AC
Start: 2014-09-01 — End: 2014-09-01

## 2014-09-01 MED ORDER — FUROSEMIDE 10 MG/ML IJ SOLN: 60.0000 mg | Freq: Two times a day (BID) | INTRAMUSCULAR | Status: DC

## 2014-09-01 MED ORDER — NITROGLYCERIN IN D5W 200-5 MCG/ML-% IV SOLN
0.0000 ug/min | Freq: Once | INTRAVENOUS | Status: AC
  Administered 2014-09-01: 90 ug/min via INTRAVENOUS
  Filled 2014-09-01: qty 250

## 2014-09-01 MED ORDER — SODIUM CHLORIDE 0.9 % IJ SOLN
3.0000 mL | Freq: Two times a day (BID) | INTRAMUSCULAR | Status: DC
  Administered 2014-09-01 – 2014-09-04 (×6): 3 mL via INTRAVENOUS

## 2014-09-01 MED ORDER — OXYCODONE HCL 5 MG PO TABS
10.0000 mg | ORAL_TABLET | ORAL | Status: DC | PRN
  Administered 2014-09-01 – 2014-09-02 (×2): 10 mg via ORAL
  Filled 2014-09-01 (×2): qty 2

## 2014-09-01 MED ORDER — TORSEMIDE 100 MG PO TABS
100.0000 mg | ORAL_TABLET | Freq: Every day | ORAL | Status: DC
  Administered 2014-09-01: 100 mg via ORAL
  Filled 2014-09-01 (×3): qty 1

## 2014-09-01 MED ORDER — SODIUM CHLORIDE 0.9 % IV SOLN
Freq: Once | INTRAVENOUS | Status: AC
  Administered 2014-09-01: 10:00:00 via INTRAVENOUS

## 2014-09-01 NOTE — Progress Notes (Signed)
Subjective: Admitted last night by hospitalists for HTN Urgency/Flash Pulmonary edema.  Objective: Vital signs in last 24 hours: Temp:  [98.1 F (36.7 C)-98.3 F (36.8 C)] 98.1 F (36.7 C) (02/06 0726) Pulse Rate:  [72-101] 83 (02/06 0726) Resp:  [12-28] 15 (02/06 0726) BP: (140-241)/(61-119) 222/81 mmHg (02/06 0726) SpO2:  [90 %-100 %] 99 % (02/06 0726) Weight:  [63.05 kg (139 lb)-64.7 kg (142 lb 10.2 oz)] 64.7 kg (142 lb 10.2 oz) (02/06 0726) Weight change:  Last BM Date: 08/31/14  CBG (last 3)   Recent Labs  09/01/14 0130 09/01/14 0725  GLUCAP 162* 132*    Intake/Output from previous day:  Intake/Output Summary (Last 24 hours) at 09/01/14 0819 Last data filed at 09/01/14 0445  Gross per 24 hour  Intake      0 ml  Output    275 ml  Net   -275 ml   02/05 0701 - 02/06 0700 In: -  Out: 275 [Urine:275]   Physical Exam  General appearance: alert and O.  Wearing FIO2.  Not in distress. Eyes: no scleral icterus Throat: oropharynx moist without erythema Resp: Mild Rales Cardio: Reg, gallop, 1/6 m, + JVD GI: soft, non-tender; bowel sounds normal; no masses,  no organomegaly Extremities: no clubbing, cyanosis or edema L Arm Fistula +Thrill and Bruit   Lab Results:  Recent Labs  08/31/14 1940 09/01/14 0124 09/01/14 0632  NA 136  --  136  K 3.5  --  3.3*  CL 99  --  99  CO2 27  --  28  GLUCOSE 238*  --  155*  BUN 62*  --  60*  CREATININE 3.26* 3.30* 3.31*  CALCIUM 9.4  --  8.8     Recent Labs  09/01/14 0632  AST 20  ALT 16  ALKPHOS 90  BILITOT 0.5  PROT 6.7  ALBUMIN 3.0*     Recent Labs  08/31/14 1940 09/01/14 0124 09/01/14 0632  WBC 11.0* 10.9* 10.6*  NEUTROABS 7.8*  --  6.5  HGB 9.8* 8.3* 8.2*  HCT 29.7* 25.6* 25.2*  MCV 84.1 84.8 85.4  PLT 317 252 276    Lab Results  Component Value Date   INR 0.96 01/09/2014   INR 0.91 07/20/2013   INR 0.95 05/23/2013     Recent Labs  09/01/14 0124 09/01/14 0632  TROPONINI 0.03 0.04*     No results for input(s): TSH, T4TOTAL, T3FREE, THYROIDAB in the last 72 hours.  Invalid input(s): FREET3  No results for input(s): VITAMINB12, FOLATE, FERRITIN, TIBC, IRON, RETICCTPCT in the last 72 hours.  Micro Results: No results found for this or any previous visit (from the past 240 hour(s)).   Studies/Results: Dg Chest Portable 1 View  08/31/2014   CLINICAL DATA:  Chest pain and shortness of breath for 2 hr.  EXAM: PORTABLE CHEST - 1 VIEW  COMPARISON:  07/31/2014  FINDINGS: The heart is mildly enlarged but stable. Slightly prominent mediastinal and hilar contours are unchanged. There is tortuosity and calcification of the thoracic aorta. There is central vascular congestion and moderate pulmonary edema. No definite pleural effusions. The bony thorax is intact.  IMPRESSION: Congestive heart failure.   Electronically Signed   By: Kalman Jewels M.D.   On: 08/31/2014 20:36     Medications: Scheduled: . amLODipine  10 mg Oral Daily  . aspirin  81 mg Oral Daily  . atorvastatin  20 mg Oral q1800  . budesonide-formoterol  1 puff Inhalation QPM  . calcitRIOL  0.25 mcg Oral Daily  . carvedilol  25 mg Oral BID WC  . enoxaparin (LOVENOX) injection  30 mg Subcutaneous Daily  . famotidine  20 mg Oral Daily  . furosemide  60 mg Intravenous BID  . insulin aspart  0-5 Units Subcutaneous QHS  . insulin aspart  0-9 Units Subcutaneous TID WC  . insulin detemir  10 Units Subcutaneous QHS  . irbesartan  150 mg Oral Daily  . isosorbide-hydrALAZINE  2 tablet Oral TID  . sodium chloride  3 mL Intravenous Q12H   Continuous:    Assessment/Plan: Active Problems:   Asthma   Acute respiratory failure   Hypertensive urgency   Type 2 diabetes mellitus with diabetic nephropathy   CHF, acute on chronic   Acute CHF   CHF exacerbation   Hypertensive urgency/Flash Pulm Edema/Acute Hypoxic resp Failure/Acute diastolic heart failure - Admitted to step down on nitro drip and oral home medications  (lasix, bidil, Avapro, Metalozone).  Known poor compliance Goal is to control blood pressure and diurese/IV Lasix. Replace K Cardiac enzymes (-) for ACS 0.01 - 0.04 Dr Einar Gip informed of admit Daily weights Another echocardiogram was ordered to evaluate EF and valves Make sure patient is on ACE/ARBi   CKD - Cr 3.6 on D/c one month ago and 3.3 today - Stable.  Dr Lorrene Reid is Nephro.  Has Near Mature AVF in place.  Saw Vasc Surg 2/3 She will f/u in 6 weeks for a repeat duplex. We may have to schedule an fistulogram if the vein has not matured on her next visit.  Anemia - Transfuse 2 units PRBCs and diurese to avoid increased vol.  Iron/Aranesp.  Asthma - albuterol as needed   Prophylaxis: Lovenox, Protonix  CODE STATUS: FULL CODE   Type 2 diabetes mellitus with diabetic nephropathy - sensitive sliding scale continue home insulin  -  Recent Labs  09/01/14 0130 09/01/14 0725  GLUCAP 162* 132*      LOS: 1 day   Deshannon Hinchliffe M 09/01/2014, 8:19 AM

## 2014-09-01 NOTE — Progress Notes (Signed)
MEDICATION RELATED CONSULT NOTE - INITIAL   Pharmacy Consult for Aranesp Indication: Anemia in CKD  Allergies  Allergen Reactions  . Lisinopril Cough  . Prednisone Other (See Comments)    Excessive fluid buildup    Patient Measurements: Height: 5\' 5"  (165.1 cm) Weight: 142 lb 10.2 oz (64.7 kg) IBW/kg (Calculated) : 57   Vital Signs: Temp: 98.1 F (36.7 C) (02/06 0726) Temp Source: Oral (02/06 0726) BP: 237/93 mmHg (02/06 0800) Pulse Rate: 83 (02/06 0800) Intake/Output from previous day: 02/05 0701 - 02/06 0700 In: -  Out: 275 [Urine:275] Intake/Output from this shift:    Labs:  Recent Labs  08/31/14 1940 09/01/14 0124 09/01/14 0632  WBC 11.0* 10.9* 10.6*  HGB 9.8* 8.3* 8.2*  HCT 29.7* 25.6* 25.2*  PLT 317 252 276  CREATININE 3.26* 3.30* 3.31*  ALBUMIN  --   --  3.0*  PROT  --   --  6.7  AST  --   --  20  ALT  --   --  16  ALKPHOS  --   --  90  BILITOT  --   --  0.5   Estimated Creatinine Clearance: 15.7 mL/min (by C-G formula based on Cr of 3.31).   Microbiology: No results found for this or any previous visit (from the past 720 hour(s)).  Medical History: Past Medical History  Diagnosis Date  . Hypertension   . Diverticulitis   . Hyperlipidemia   . Asthma   . Diabetes mellitus     insulin dependent  . Arthritis     knee  . GI bleed 03/31/2013  . CHF (congestive heart failure)   . Osteoporosis   . Seasonal allergies   . Anxiety   . Abdominal bruit   . Chronic kidney disease     stage 3  . Sleep apnea     can't afford cpap  . Pneumonia   . GERD (gastroesophageal reflux disease)     from medications    Assessment: 64yo female with CKD who presented w/ SOB, admitted for CHF exacerbation.  Pharmacy consulted to dose Aranesp.  Pt not yet on HD, had AVF surgery on 07/10/2014, now near mature.  Hgb on admission 9.8, now 8.2 - decrease appears to be dilutional.  Iron studies pending.  Pt previously receiving Aranesp qWed, most recently  q28days, last dose 08/30/2014.   Plan:  Aranesp 40mg  SubQ weekly, next dose 09/06/2014 if still inpatient Ferrous sulfate 325mg  PO TID F/U iron studies  Drucie Opitz, PharmD Clinical Pharmacy Resident Pager: [phone] 09/01/2014 9:25 AM

## 2014-09-01 NOTE — ED Notes (Addendum)
Spoke to Pharmacy about missing Levemir and Lovenox dose, requested approx 1 hour ago.  Will send Levemir.  Sts okay to wait on Lovenox until morning dose.

## 2014-09-01 NOTE — ED Notes (Addendum)
Spoke to Dr. Rudean Hitt from [facility] about resulted CBC  At 2:07am with decreased Hgb since previous at 7pm 08/31/14 (from 9.8 to 8.3).  Requests additional CBC for 5am where he would then like follow up with result and monitoring for continued dropping.  Requests follow up before if active bleeding noticed in meantime.

## 2014-09-01 NOTE — Progress Notes (Signed)
Admitted from the ED by bed with cont. Nitro gtt at 90 mcg/hr. bp [id number] systolic, increased nitro. Gtt to 100 mcg. MD aware and gave order to continue nitro. Denied any discomfort.

## 2014-09-01 NOTE — Consult Note (Addendum)
CARDIOLOGY CONSULT NOTE  Patient ID: MELANIA SKILLIN MRN: [id number] DOB/AGE: [id number] y.o.  Admit date: 08/31/2014 Referring Physician  Shon Baton, MD Primary Physician:  Velna Hatchet, MD Reason for Consultation  Hypertensive emergency  HPI: Patient is a 64 year old African American female with history of present stent hypertension, stage IV chronic kidney disease, admitted to the hospital with acute pulmonary edema. I was asked to see the patient for management of hypertension and pulmonary edema.  Patient has started to feel better since admission to the hospital, still has mild shortness of breath. States that she is able to lay down flat now. No chest pain, no hemoptysis, no leg edema.  She has stage IV chronic kidney disease, hemodialysis has not started yet. No other specific complaints, she states that she's been following a salt restricted diet and has been taking all her medications as prescribed. Patient also has uncontrolled diabetes mellitus  Past Medical History  Diagnosis Date  . Hypertension   . Diverticulitis   . Hyperlipidemia   . Asthma   . Diabetes mellitus     insulin dependent  . Arthritis     knee  . GI bleed 03/31/2013  . CHF (congestive heart failure)   . Osteoporosis   . Seasonal allergies   . Anxiety   . Abdominal bruit   . Chronic kidney disease     stage 3  . Sleep apnea     can't afford cpap  . Pneumonia   . GERD (gastroesophageal reflux disease)     from medications     Past Surgical History  Procedure Laterality Date  . Abdominal hysterectomy  1993`  . Esophagogastroduodenoscopy N/A 03/31/2013    Procedure: ESOPHAGOGASTRODUODENOSCOPY (EGD);  Surgeon: Gatha Mayer, MD;  Location: [facility] ENDOSCOPY;  Service: Endoscopy;  Laterality: N/A;  . Eye surgery      laser surgery  . Bascilic vein transposition Left 07/10/2014    Procedure: BASCILIC VEIN TRANSPOSITION;  Surgeon: Angelia Mould, MD;  Location: [facility] OR;  Service: Vascular;   Laterality: Left;     Family History  Problem Relation Age of Onset  . Colon cancer Neg Hx   . Other Mother     not sure of cause of death  . Diabetes Father   . Pancreatic cancer Maternal Grandmother      Social History: History   Social History  . Marital Status: Married    Spouse Name: [name]    Number of Children: 4  . Years of Education: some collg   Occupational History  . Retired    Social History Main Topics  . Smoking status: Former Smoker -- 0.35 packs/day for 40 years    Types: Cigarettes    Quit date: 05/10/2012  . Smokeless tobacco: Never Used  . Alcohol Use: No  . Drug Use: Yes     Comment: marijuana; quit in early 1980's  . Sexual Activity: Yes   Other Topics Concern  . Not on file   Social History Narrative   Patient lives at home with spouse.   Caffeine Use: 1 cup daily     Prescriptions prior to admission  Medication Sig Dispense Refill Last Dose  . amLODipine (NORVASC) 10 MG tablet Take 1 tablet (10 mg total) by mouth daily. 30 tablet 0 08/31/2014 at Unknown time  . aspirin 81 MG chewable tablet Chew 81 mg by mouth daily.   08/31/2014 at Unknown time  . atorvastatin (LIPITOR) 20 MG tablet Take 20 mg by mouth  daily at 6 PM.   08/30/2014 at Unknown time  . budesonide-formoterol (SYMBICORT) 160-4.5 MCG/ACT inhaler Inhale 1 puff into the lungs every evening.   08/30/2014 at Unknown time  . calcitRIOL (ROCALTROL) 0.25 MCG capsule Take 0.25 mcg by mouth daily.   08/31/2014 at Unknown time  . carvedilol (COREG) 25 MG tablet Take 1 tablet (25 mg total) by mouth 2 (two) times daily with a meal. 60 tablet 1 08/31/2014 at 1000  . famotidine (PEPCID) 20 MG tablet Take 20 mg by mouth daily.   08/31/2014 at Unknown time  . furosemide (LASIX) 40 MG tablet Take 1.5 tablets (60 mg total) by mouth 2 (two) times daily. (Patient taking differently: Take 40 mg by mouth 2 (two) times daily. ) 90 tablet 1 08/31/2014 at Unknown time  . insulin aspart (NOVOLOG) 100 UNIT/ML injection  Inject 0-8 Units into the skin 3 (three) times daily before meals. Sliding scale   08/30/2014 at Unknown time  . insulin detemir (LEVEMIR) 100 UNIT/ML injection Inject 10 Units into the skin at bedtime.   08/30/2014 at Unknown time  . irbesartan (AVAPRO) 150 MG tablet Take 1 tablet (150 mg total) by mouth daily. 30 tablet 1 08/31/2014 at Unknown time  . isosorbide-hydrALAZINE (BIDIL) 20-37.5 MG per tablet Take 2 tablets by mouth 3 (three) times daily. 180 tablet 1 08/31/2014 at Unknown time  . metolazone (ZAROXOLYN) 5 MG tablet Take 1 tablet by mouth as needed (urination).    Past Week at Unknown time  . oxyCODONE-acetaminophen (PERCOCET) 5-325 MG per tablet Take 1-2 tablets by mouth every 6 (six) hours as needed for moderate pain. 25 tablet 0 Past Week at Unknown time  . potassium chloride SA (K-DUR,KLOR-CON) 20 MEQ tablet Take 20 mEq by mouth 2 (two) times daily.   08/31/2014 at Unknown time  . Vitamin D, Ergocalciferol, (DRISDOL) 50000 UNITS CAPS capsule Take 50,000 Units by mouth every 7 (seven) days. On wednesday   Past Week at Unknown time  . cyclobenzaprine (FLEXERIL) 10 MG tablet Take 1 tablet (10 mg total) by mouth 2 (two) times daily as needed for muscle spasms. 20 tablet 0 Taking  . diazepam (VALIUM) 5 MG tablet Take 1 tablet (5 mg total) by mouth every 8 (eight) hours as needed for anxiety (spasms). 20 tablet 0 Taking  . ondansetron (ZOFRAN ODT) 4 MG disintegrating tablet 4mg  ODT q4 hours prn nausea/vomit 25 tablet 0 Taking    Scheduled Meds: . sodium chloride   Intravenous Once  . sodium chloride   Intravenous Once  . amLODipine  10 mg Oral Daily  . aspirin  81 mg Oral Daily  . atorvastatin  20 mg Oral q1800  . budesonide-formoterol  1 puff Inhalation QPM  . calcitRIOL  0.25 mcg Oral Daily  . [START ON 09/02/2014] darbepoetin (ARANESP) injection - NON-DIALYSIS  40 mcg Subcutaneous Q Sun-1800  . enoxaparin (LOVENOX) injection  30 mg Subcutaneous Daily  . famotidine  20 mg Oral Daily  .  ferrous sulfate  325 mg Oral TID WC  . furosemide  60 mg Intravenous BID  . insulin aspart  0-5 Units Subcutaneous QHS  . insulin aspart  0-9 Units Subcutaneous TID WC  . insulin detemir  10 Units Subcutaneous QHS  . irbesartan  150 mg Oral Daily  . isosorbide-hydrALAZINE  2 tablet Oral TID  . labetalol  15 mg Intravenous Q6H  . nitroGLYCERIN  0-200 mcg/min Intravenous Once  . potassium chloride  20 mEq Oral BID  . sodium chloride  3 mL Intravenous Q12H  . torsemide  100 mg Oral Daily   Continuous Infusions:  PRN Meds:.sodium chloride, acetaminophen, hydrALAZINE, metolazone, ondansetron (ZOFRAN) IV, senna-docusate, sodium chloride  ROS: General: no fevers/chills/night sweats Eyes: no blurry vision, diplopia, or amaurosis ENT: no sore throat or hearing loss Resp: Sudden onset of cough and shortness of breath started yesterday. CV: no edema or palpitations GI: no abdominal pain, nausea, vomiting, diarrhea, or constipation GU: no dysuria, frequency, or hematuria Skin: no rash Neuro: Has mild headaches headache, denies numbness, tingling, or weakness of extremities Musculoskeletal: no joint pain or swelling Heme: no bleeding, DVT, or easy bruising Endo: no polydipsia or polyuria    Physical Exam: Blood pressure 237/93, pulse 83, temperature 98.1 F (36.7 C), temperature source Oral, resp. rate 15, height 5\' 5"  (1.651 m), weight 64.7 kg (142 lb 10.2 oz), SpO2 99 %.   General appearance: alert, cooperative, appears stated age and no distress Lungs: rales bibasilar and bilaterally Chest wall: no tenderness Heart: S1, S2 normal, S4 present and No murmur or rub. Abdomen: soft, non-tender; bowel sounds normal; no masses,  no organomegaly Extremities: extremities normal, atraumatic, no cyanosis or edema Pulses: 2+ and symmetric , pedal pulse 1+ bilateral, left axillary a fistula present with continuous hum Neurologic: Grossly normal  Labs:   Lab Results  Component Value Date    WBC 10.6* 09/01/2014   HGB 8.2* 09/01/2014   HCT 25.2* 09/01/2014   MCV 85.4 09/01/2014   PLT 276 09/01/2014    Recent Labs Lab 09/01/14 0632  NA 136  K 3.3*  CL 99  CO2 28  BUN 60*  CREATININE 3.31*  CALCIUM 8.8  PROT 6.7  BILITOT 0.5  ALKPHOS 90  ALT 16  AST 20  GLUCOSE 155*   Lab Results  Component Value Date   CKTOTAL 73 08/23/2011   CKMB 3.4 08/23/2011   TROPONINI 0.04* 09/01/2014    Lipid Panel     Component Value Date/Time   CHOL 184 12/01/2010 0455   TRIG 86 12/01/2010 0455   HDL 88 12/01/2010 0455   CHOLHDL 2.1 12/01/2010 0455   VLDL 17 12/01/2010 0455   LDLCALC  12/01/2010 0455    79        Total Cholesterol/HDL:CHD Risk Coronary Heart Disease Risk Table                     Men   Women  1/2 Average Risk   3.4   3.3  Average Risk       5.0   4.4  2 X Average Risk   9.6   7.1  3 X Average Risk  23.4   11.0        Use the calculated Patient Ratio above and the CHD Risk Table to determine the patient's CHD Risk.        ATP III CLASSIFICATION (LDL):  <100     mg/dL   Optimal  100-129  mg/dL   Near or Above                    Optimal  130-159  mg/dL   Borderline  160-189  mg/dL   High  >190     mg/dL   Very High    EKG: Normal sinus rhythm, normal axis. LVH with repolarization abnormality, cannot exclude lateral ischemia. No change from prior EKG.   Echocardiogram 01/10/2014: Normal LV systolic function, EF [id number] without regional wall motion abnormality. Restrictive physiology with  elevated LA pressure. Mild pulmonary hypertension. Radiology: Dg Chest Portable 1 View  08/31/2014   CLINICAL DATA:  Chest pain and shortness of breath for 2 hr.  EXAM: PORTABLE CHEST - 1 VIEW  COMPARISON:  07/31/2014  FINDINGS: The heart is mildly enlarged but stable. Slightly prominent mediastinal and hilar contours are unchanged. There is tortuosity and calcification of the thoracic aorta. There is central vascular congestion and moderate pulmonary edema. No definite  pleural effusions. The bony thorax is intact.  IMPRESSION: Congestive heart failure.   Electronically Signed   By: Kalman Jewels M.D.   On: 08/31/2014 20:36    ASSESSMENT AND PLAN:  1. Hypertensive crisis 2. Acute pulmonary edema due to hypertension 3. Diabetes mellitus type 2 uncontrolled 4. Hyperlipidemia 5. Positive troponins, type II leak, do not think acute coronary syndrome.  Recommendation: I have discontinued furosemide, started her on 100 mg of Demadex, patient does not responded with diuresis with IV Lasix. Also added labetalol intravenously 15 mg every 6 hours, can be increased to every 4 hours depending on her response. We may have to consider early initiation of dialysis if blood pressure does not get controlled. She has had renal duplex in the past that did not reveal renal artery stenosis. I still suspect some noncompliance.  Laverda Page, MD 09/01/2014, 9:24 AM [facility]. [place] Pager: [phone] Office: [phone] If no answer Cell [phone]

## 2014-09-01 NOTE — ED Notes (Signed)
Pt CBG result was 162. Informed RN.

## 2014-09-02 ENCOUNTER — Inpatient Hospital Stay (HOSPITAL_COMMUNITY): Payer: BC Managed Care – PPO

## 2014-09-02 LAB — CBC
HCT: 30.5 % — ABNORMAL LOW (ref 36.0–46.0)
Hemoglobin: 10 g/dL — ABNORMAL LOW (ref 12.0–15.0)
MCH: 28.2 pg (ref 26.0–34.0)
MCHC: 32.8 g/dL (ref 30.0–36.0)
MCV: 85.9 fL (ref 78.0–100.0)
Platelets: 268 10*3/uL (ref 150–400)
RBC: 3.55 MIL/uL — ABNORMAL LOW (ref 3.87–5.11)
RDW: 13.6 % (ref 11.5–15.5)
WBC: 10.1 10*3/uL (ref 4.0–10.5)

## 2014-09-02 LAB — GLUCOSE, CAPILLARY
Glucose-Capillary: 70 mg/dL (ref 70–99)
Glucose-Capillary: 131 mg/dL — ABNORMAL HIGH (ref 70–99)
Glucose-Capillary: 120 mg/dL — ABNORMAL HIGH (ref 70–99)
Glucose-Capillary: 103 mg/dL — ABNORMAL HIGH (ref 70–99)
Glucose-Capillary: 197 mg/dL — ABNORMAL HIGH (ref 70–99)

## 2014-09-02 LAB — TYPE AND SCREEN
ABO/RH(D): B POS
Antibody Screen: NEGATIVE
Unit division: 0

## 2014-09-02 LAB — BRAIN NATRIURETIC PEPTIDE: B Natriuretic Peptide: 777.8 pg/mL — ABNORMAL HIGH (ref 0.0–100.0)

## 2014-09-02 MED ORDER — PROMETHAZINE HCL 25 MG/ML IJ SOLN
12.5000 mg | Freq: Four times a day (QID) | INTRAMUSCULAR | Status: DC | PRN
  Administered 2014-09-02: 12.5 mg via INTRAVENOUS
  Filled 2014-09-02: qty 1

## 2014-09-02 MED ORDER — FUROSEMIDE 80 MG PO TABS
80.0000 mg | ORAL_TABLET | Freq: Two times a day (BID) | ORAL | Status: DC
  Administered 2014-09-02: 80 mg via ORAL
  Filled 2014-09-02: qty 1
  Filled 2014-09-02: qty 2
  Filled 2014-09-02 (×3): qty 1

## 2014-09-02 NOTE — Progress Notes (Signed)
Subjective: Admitted 2/5 by hospitalists for HTN Urgency/Flash Pulmonary edema. Yesterday am she looked OK and was energetic and interactive. This am she is sitting over a bucket spitting up c N/V.  She reports Anorexia.  She feels bad and has not been able to take pills. S/P 1 unit PRBC and Hbg better  Objective: Vital signs in last 24 hours: Temp:  [97.9 F (36.6 C)-98.3 F (36.8 C)] 98 F (36.7 C) (02/07 0839) Pulse Rate:  [74-85] 79 (02/07 0915) Resp:  [8-18] 17 (02/07 0915) BP: (156-245)/(64-133) 240/100 mmHg (02/07 0915) SpO2:  [88 %-100 %] 99 % (02/07 0915) Weight:  [64.7 kg (142 lb 10.2 oz)] 64.7 kg (142 lb 10.2 oz) (02/07 0406) Weight change: 1.65 kg (3 lb 10.2 oz) Last BM Date: 09/01/14  CBG (last 3)   Recent Labs  09/01/14 1248 09/01/14 1648 09/01/14 2135  GLUCAP 115* 206* 89    Intake/Output from previous day:  Intake/Output Summary (Last 24 hours) at 09/02/14 1025 Last data filed at 09/02/14 0423  Gross per 24 hour  Intake    940 ml  Output   1100 ml  Net   -160 ml   02/06 0701 - 02/07 0700 In: [id number] [P.O.:350; I.V.:245; Blood:365] Out: 1250 [Urine:1250]   Physical Exam  General appearance: alert and O.  Not in distress.  Vomiting Eyes: no scleral icterus Throat: oropharynx moist without erythema Resp: Mild Rales Cardio: Reg, gallop, 1/6 m, + JVD GI: soft, non-tender;  Extremities: no clubbing, cyanosis or edema L Arm Fistula +Thrill and Bruit   Lab Results:  Recent Labs  08/31/14 1940 09/01/14 0124 09/01/14 0632  NA 136  --  136  K 3.5  --  3.3*  CL 99  --  99  CO2 27  --  28  GLUCOSE 238*  --  155*  BUN 62*  --  60*  CREATININE 3.26* 3.30* 3.31*  CALCIUM 9.4  --  8.8     Recent Labs  09/01/14 0632  AST 20  ALT 16  ALKPHOS 90  BILITOT 0.5  PROT 6.7  ALBUMIN 3.0*     Recent Labs  08/31/14 1940  09/01/14 0632 09/01/14 0900 09/02/14 0421  WBC 11.0*  < > 10.6* 9.5 10.1  NEUTROABS 7.8*  --  6.5  --   --   HGB 9.8*  <  > 8.2* 7.8* 10.0*  HCT 29.7*  < > 25.2* 24.2* 30.5*  MCV 84.1  < > 85.4 85.2 85.9  PLT 317  < > 276 272 268  < > = values in this interval not displayed.  Lab Results  Component Value Date   INR 0.96 01/09/2014   INR 0.91 07/20/2013   INR 0.95 05/23/2013     Recent Labs  09/01/14 0124 09/01/14 0632 09/01/14 1200  TROPONINI 0.03 0.04* 0.04*    No results for input(s): TSH, T4TOTAL, T3FREE, THYROIDAB in the last 72 hours.  Invalid input(s): FREET3   Recent Labs  09/01/14 0900  VITAMINB12 465  FOLATE 18.5  FERRITIN 280  TIBC 433  IRON 313*  RETICCTPCT 1.6    Micro Results: Recent Results (from the past 240 hour(s))  MRSA PCR Screening     Status: None   Collection Time: 09/01/14  7:16 AM  Result Value Ref Range Status   MRSA by PCR NEGATIVE NEGATIVE Final    Comment:        The GeneXpert MRSA Assay (FDA approved for NASAL specimens only), is one component of  a comprehensive MRSA colonization surveillance program. It is not intended to diagnose MRSA infection nor to guide or monitor treatment for MRSA infections.      Studies/Results: Dg Chest Port 1 View  09/02/2014   CLINICAL DATA:  CHF, weakness, history of hypertension, asthma, diabetes, former smoker  EXAM: PORTABLE CHEST - 1 VIEW  COMPARISON:  08/31/2014; 07/30/2014; 05/15/2014  FINDINGS: Grossly unchanged enlarged cardiac silhouette. Overall improved aeration of the lungs. Mild pulmonary venous congestion without frank evidence of edema. No pleural effusion or pneumothorax. No focal airspace opacities. Unchanged bones.  IMPRESSION: 1. Improved aeration along suggests resolving edema and atelectasis. 2. Cardiomegaly and pulmonary venous congestion without acute cardiopulmonary disease.   Electronically Signed   By: Sandi Mariscal M.D.   On: 09/02/2014 08:41   Dg Chest Portable 1 View  08/31/2014   CLINICAL DATA:  Chest pain and shortness of breath for 2 hr.  EXAM: PORTABLE CHEST - 1 VIEW  COMPARISON:   07/31/2014  FINDINGS: The heart is mildly enlarged but stable. Slightly prominent mediastinal and hilar contours are unchanged. There is tortuosity and calcification of the thoracic aorta. There is central vascular congestion and moderate pulmonary edema. No definite pleural effusions. The bony thorax is intact.  IMPRESSION: Congestive heart failure.   Electronically Signed   By: Kalman Jewels M.D.   On: 08/31/2014 20:36     Medications: Scheduled: . amLODipine  10 mg Oral Daily  . aspirin  81 mg Oral Daily  . atorvastatin  20 mg Oral q1800  . budesonide-formoterol  1 puff Inhalation QPM  . calcitRIOL  0.25 mcg Oral Daily  . [START ON 09/06/2014] darbepoetin (ARANESP) injection - NON-DIALYSIS  40 mcg Subcutaneous Weekly  . enoxaparin (LOVENOX) injection  30 mg Subcutaneous Daily  . famotidine  20 mg Oral Daily  . ferrous sulfate  325 mg Oral TID WC  . insulin aspart  0-5 Units Subcutaneous QHS  . insulin aspart  0-9 Units Subcutaneous TID WC  . insulin detemir  10 Units Subcutaneous QHS  . irbesartan  150 mg Oral Daily  . isosorbide-hydrALAZINE  2 tablet Oral TID  . labetalol  15 mg Intravenous Q6H  . potassium chloride  20 mEq Oral BID  . sodium chloride  3 mL Intravenous Q12H  . torsemide  100 mg Oral Q breakfast   Continuous:    Assessment/Plan: Active Problems:   Asthma   Acute respiratory failure   Hypertensive urgency   Type 2 diabetes mellitus with diabetic nephropathy   CHF, acute on chronic   Acute CHF   CHF exacerbation   Hypertensive urgency/Flash Pulm Edema/Acute Hypoxic resp Failure/Acute diastolic heart failure - Admitted to step down on nitro drip and oral home medications (lasix, bidil, Avapro, Metalozone).  Known poor compliance Goal is to control blood pressure and diurese/IV Demedex. Replace K Cardiac enzymes (-) for ACS 0.01 - 0.04 Dr Einar Gip consulted and wonders if pt should just go on Dialysis to help control BP? Daily weights Another echocardiogram  was ordered to evaluate EF and valves CXR already better  CKD - Cr 3.6 on D/c one month ago and 3.3 last check - Stable.  Dr Lorrene Reid is Nephro.  Has Near Mature AVF in place.  Saw Vasc Surg 2/3 She will f/u in 6 weeks for a repeat duplex. We may have to schedule an fistulogram if the vein has not matured on her next visit. Uremia today.  BP still poor.  ?need for early HD  ? Uremia-  c N/V - check Ab [place] and control Sxs. Zofran/Phenergan.  Recheck LFTs and Lipase.  Check KUB  Anemia - Transfused 1 units PRBC and HBg better.  Iron/Aranesp.  Asthma - albuterol as needed   Prophylaxis: Lovenox, Protonix  CODE STATUS: FULL CODE   Type 2 diabetes mellitus with diabetic nephropathy - sensitive sliding scale continue home insulin  -   Recent Labs  09/01/14 1248 09/01/14 1648 09/01/14 2135  GLUCAP 115* 206* 89      LOS: 2 days   Carvel Huskins M 09/02/2014, 10:25 AM

## 2014-09-02 NOTE — Consult Note (Signed)
Destiny Day is an 64 y.o. female referred by Dr Virgina Jock   Chief Complaint: CKD 4, HTN, pulm edema HPI: 64yo black female with CKD 4 admitted 09/01/14 for sob.  Found to be hypertensive with pulm edema similar to previous admissions.  Has had thorough workup in past for renal art stenosis that has been negative.  Has baseline Scr upper 2's to 3.  Scr 3.2 yest and 3.3 today.  UO 1.2L yest.  She "ate like a pig" yest according to husband but today she developed N/V after given pain medication.    Past Medical History  Diagnosis Date  . Hypertension   . Diverticulitis   . Hyperlipidemia   . Asthma   . Diabetes mellitus     insulin dependent  . Arthritis     knee  . GI bleed 03/31/2013  . CHF (congestive heart failure)   . Osteoporosis   . Seasonal allergies   . Anxiety   . Abdominal bruit   . Chronic kidney disease     stage 3  . Sleep apnea     can't afford cpap  . Pneumonia   . GERD (gastroesophageal reflux disease)     from medications    Past Surgical History  Procedure Laterality Date  . Abdominal hysterectomy  1993`  . Esophagogastroduodenoscopy N/A 03/31/2013    Procedure: ESOPHAGOGASTRODUODENOSCOPY (EGD);  Surgeon: Gatha Mayer, MD;  Location: [facility] ENDOSCOPY;  Service: Endoscopy;  Laterality: N/A;  . Eye surgery      laser surgery  . Bascilic vein transposition Left 07/10/2014    Procedure: BASCILIC VEIN TRANSPOSITION;  Surgeon: Angelia Mould, MD;  Location: [facility] OR;  Service: Vascular;  Laterality: Left;    Family History  Problem Relation Age of Onset  . Colon cancer Neg Hx   . Other Mother     not sure of cause of death  . Diabetes Father   . Pancreatic cancer Maternal Grandmother   FH neg for renal ds  Social History:  reports that she quit smoking about 2 years ago. Her smoking use included Cigarettes. She has a 14 pack-year smoking history. She has never used smokeless tobacco. She reports that she uses illicit drugs. She reports that she does not  drink alcohol. Married and lives with husband in [place]  Allergies:  Allergies  Allergen Reactions  . Lisinopril Cough  . Prednisone Other (See Comments)    Excessive fluid buildup    Medications Prior to Admission  Medication Sig Dispense Refill  . amLODipine (NORVASC) 10 MG tablet Take 1 tablet (10 mg total) by mouth daily. 30 tablet 0  . aspirin 81 MG chewable tablet Chew 81 mg by mouth daily.    Marland Kitchen atorvastatin (LIPITOR) 20 MG tablet Take 20 mg by mouth daily at 6 PM.    . budesonide-formoterol (SYMBICORT) 160-4.5 MCG/ACT inhaler Inhale 1 puff into the lungs every evening.    . calcitRIOL (ROCALTROL) 0.25 MCG capsule Take 0.25 mcg by mouth daily.    . carvedilol (COREG) 25 MG tablet Take 1 tablet (25 mg total) by mouth 2 (two) times daily with a meal. 60 tablet 1  . famotidine (PEPCID) 20 MG tablet Take 20 mg by mouth daily.    . furosemide (LASIX) 40 MG tablet Take 1.5 tablets (60 mg total) by mouth 2 (two) times daily. (Patient taking differently: Take 40 mg by mouth 2 (two) times daily. ) 90 tablet 1  . insulin aspart (NOVOLOG) 100 UNIT/ML injection Inject 0-8  Units into the skin 3 (three) times daily before meals. Sliding scale    . insulin detemir (LEVEMIR) 100 UNIT/ML injection Inject 10 Units into the skin at bedtime.    . irbesartan (AVAPRO) 150 MG tablet Take 1 tablet (150 mg total) by mouth daily. 30 tablet 1  . isosorbide-hydrALAZINE (BIDIL) 20-37.5 MG per tablet Take 2 tablets by mouth 3 (three) times daily. 180 tablet 1  . metolazone (ZAROXOLYN) 5 MG tablet Take 1 tablet by mouth as needed (urination).     Marland Kitchen oxyCODONE-acetaminophen (PERCOCET) 5-325 MG per tablet Take 1-2 tablets by mouth every 6 (six) hours as needed for moderate pain. 25 tablet 0  . potassium chloride SA (K-DUR,KLOR-CON) 20 MEQ tablet Take 20 mEq by mouth 2 (two) times daily.    . Vitamin D, Ergocalciferol, (DRISDOL) 50000 UNITS CAPS capsule Take 50,000 Units by mouth every 7 (seven) days. On wednesday    .  cyclobenzaprine (FLEXERIL) 10 MG tablet Take 1 tablet (10 mg total) by mouth 2 (two) times daily as needed for muscle spasms. 20 tablet 0  . diazepam (VALIUM) 5 MG tablet Take 1 tablet (5 mg total) by mouth every 8 (eight) hours as needed for anxiety (spasms). 20 tablet 0  . ondansetron (ZOFRAN ODT) 4 MG disintegrating tablet 4mg  ODT q4 hours prn nausea/vomit 25 tablet 0     Lab Results: UA: ND   Recent Labs  09/01/14 0632 09/01/14 0900 09/02/14 0421  WBC 10.6* 9.5 10.1  HGB 8.2* 7.8* 10.0*  HCT 25.2* 24.2* 30.5*  PLT 276 272 268   BMET  Recent Labs  08/31/14 1940 09/01/14 0124 09/01/14 0632  NA 136  --  136  K 3.5  --  3.3*  CL 99  --  99  CO2 27  --  28  GLUCOSE 238*  --  155*  BUN 62*  --  60*  CREATININE 3.26* 3.30* 3.31*  CALCIUM 9.4  --  8.8   LFT  Recent Labs  09/01/14 0632  PROT 6.7  ALBUMIN 3.0*  AST 20  ALT 16  ALKPHOS 90  BILITOT 0.5   Dg Chest Port 1 View  09/02/2014   CLINICAL DATA:  CHF, weakness, history of hypertension, asthma, diabetes, former smoker  EXAM: PORTABLE CHEST - 1 VIEW  COMPARISON:  08/31/2014; 07/30/2014; 05/15/2014  FINDINGS: Grossly unchanged enlarged cardiac silhouette. Overall improved aeration of the lungs. Mild pulmonary venous congestion without frank evidence of edema. No pleural effusion or pneumothorax. No focal airspace opacities. Unchanged bones.  IMPRESSION: 1. Improved aeration along suggests resolving edema and atelectasis. 2. Cardiomegaly and pulmonary venous congestion without acute cardiopulmonary disease.   Electronically Signed   By: Sandi Mariscal M.D.   On: 09/02/2014 08:41   Dg Chest Portable 1 View  08/31/2014   CLINICAL DATA:  Chest pain and shortness of breath for 2 hr.  EXAM: PORTABLE CHEST - 1 VIEW  COMPARISON:  07/31/2014  FINDINGS: The heart is mildly enlarged but stable. Slightly prominent mediastinal and hilar contours are unchanged. There is tortuosity and calcification of the thoracic aorta. There is central  vascular congestion and moderate pulmonary edema. No definite pleural effusions. The bony thorax is intact.  IMPRESSION: Congestive heart failure.   Electronically Signed   By: Kalman Jewels M.D.   On: 08/31/2014 20:36    ROS: No change in vision Breathing better No CP +N/V, denies abd pain Chronic pain in Lt arm since AVF placed, just worse now No edema No dysuria  PHYSICAL EXAM: Blood pressure 200/69, pulse 90, temperature 97.7 F (36.5 C), temperature source Oral, resp. rate 17, height 5\' 5"  (1.651 m), weight 64.7 kg (142 lb 10.2 oz), SpO2 96 %. HEENT: PERRLA EOMI NECK:No JVD LUNGS: few faint basilar crackles CARDIAC:RRR 1/6 systolic M ABD:+ BS NTND No HSM EXT:No edema  LUA ARF + bruit NEURO:CNI Ox3 No asterixis  Assessment: 1. CKD 4 2. HTN with pulm edema, BP high now in midst of N/V 3. N/V ? If sec to pain med vs other etiology ie centrally mediated.  At this time it is not due to her CKD which is stable 4. DM 5.  Sec HPTH on calcitriol PLAN: 1. Hold ARB til able to take PO as in this setting it can worsen renal fx in this setting 2. Renal diet 3. Check PO4 4. DC PO iron 5. Cont aranesp 6. Daily Scr 7. Change torsemide to lasix  Hussein Macdougal T 09/02/2014, 1:45 PM

## 2014-09-02 NOTE — Care Management Note (Signed)
    Page 1 of 1   09/02/2014     10:26:20 AM CARE MANAGEMENT NOTE 09/02/2014  Patient:  Destiny Day,Destiny Day   Account Number:  [id number]  Date Initiated:  09/02/2014  Documentation initiated by:  [facility]  Subjective/Objective Assessment:   adm: Hypertensive emergency     Action/Plan:   discharge planning   Anticipated DC Date:  09/04/2014   Anticipated DC Plan:  [place]  CM consult      [facility] Choice  HOME HEALTH   Choice offered to / List presented to:          [facility] arranged  [place] - 11 Patient Refused      Status of service:   Medicare Important Message given?   (If response is "NO", the following Medicare IM given date fields will be blank) Date Medicare IM given:   Medicare IM given by:   Date Additional Medicare IM given:   Additional Medicare IM given by:    Discharge Disposition:  HOME/SELF CARE  Per UR Regulation:    If discussed at Long Length of Stay Meetings, dates discussed:    Comments:  09/02/14 10:00 CM met with pt in room to discuss discharge needs.  Pt states she has had [facility] in the past render [facility] service but then received copay bills totaling over 125.00. Pt states she cannot afford co-pays on [facility] services and, kindly and soberly, refuses [facility] services.  No other CM needs were communicated.  Mariane Masters, BSN, CM [phone].

## 2014-09-02 NOTE — Progress Notes (Signed)
  Echocardiogram 2D Echocardiogram has been performed.  Lysle Rubens 09/02/2014, 12:36 PM

## 2014-09-03 LAB — COMPREHENSIVE METABOLIC PANEL
ALT: 11 U/L (ref 0–35)
AST: 19 U/L (ref 0–37)
Albumin: 3 g/dL — ABNORMAL LOW (ref 3.5–5.2)
Alkaline Phosphatase: 80 U/L (ref 39–117)
Anion gap: 9 (ref 5–15)
BUN: 61 mg/dL — ABNORMAL HIGH (ref 6–23)
CO2: 29 mmol/L (ref 19–32)
Calcium: 9.2 mg/dL (ref 8.4–10.5)
Chloride: 99 mmol/L (ref 96–112)
Creatinine, Ser: 4.09 mg/dL — ABNORMAL HIGH (ref 0.50–1.10)
GFR calc Af Amer: 12 mL/min — ABNORMAL LOW (ref >90)
GFR calc non Af Amer: 11 mL/min — ABNORMAL LOW (ref >90)
Glucose, Bld: 112 mg/dL — ABNORMAL HIGH (ref 70–99)
Potassium: 3.8 mmol/L (ref 3.5–5.1)
Sodium: 137 mmol/L (ref 135–145)
Total Bilirubin: 0.6 mg/dL (ref 0.3–1.2)
Total Protein: 6.4 g/dL (ref 6.0–8.3)

## 2014-09-03 LAB — PHOSPHORUS: Phosphorus: 4.6 mg/dL (ref 2.3–4.6)

## 2014-09-03 LAB — CBC
HCT: 29.2 % — ABNORMAL LOW (ref 36.0–46.0)
Hemoglobin: 9.3 g/dL — ABNORMAL LOW (ref 12.0–15.0)
MCH: 28.4 pg (ref 26.0–34.0)
MCHC: 31.8 g/dL (ref 30.0–36.0)
MCV: 89.3 fL (ref 78.0–100.0)
Platelets: 270 10*3/uL (ref 150–400)
RBC: 3.27 MIL/uL — ABNORMAL LOW (ref 3.87–5.11)
RDW: 14 % (ref 11.5–15.5)
WBC: 8.9 10*3/uL (ref 4.0–10.5)

## 2014-09-03 LAB — BRAIN NATRIURETIC PEPTIDE: B Natriuretic Peptide: 895.9 pg/mL — ABNORMAL HIGH (ref 0.0–100.0)

## 2014-09-03 MED ORDER — HYDRALAZINE HCL 25 MG PO TABS: 25.0000 mg | ORAL_TABLET | Freq: Three times a day (TID) | ORAL | Status: DC

## 2014-09-03 MED ORDER — OXYCODONE-ACETAMINOPHEN 5-325 MG PO TABS
1.0000 | ORAL_TABLET | Freq: Four times a day (QID) | ORAL | Status: DC | PRN
  Administered 2014-09-03 – 2014-09-04 (×2): 1 via ORAL
  Filled 2014-09-03 (×2): qty 1

## 2014-09-03 MED ORDER — POLYETHYLENE GLYCOL 3350 17 G PO PACK
17.0000 g | PACK | Freq: Every day | ORAL | Status: DC
  Administered 2014-09-03 – 2014-09-05 (×3): 17 g via ORAL
  Filled 2014-09-03 (×4): qty 1

## 2014-09-03 MED ORDER — CLONIDINE HCL 0.1 MG PO TABS
0.1000 mg | ORAL_TABLET | Freq: Three times a day (TID) | ORAL | Status: DC
  Administered 2014-09-03 – 2014-09-05 (×6): 0.1 mg via ORAL
  Filled 2014-09-03 (×8): qty 1

## 2014-09-03 MED ORDER — TORSEMIDE 100 MG PO TABS
100.0000 mg | ORAL_TABLET | Freq: Every day | ORAL | Status: DC
  Administered 2014-09-03 – 2014-09-05 (×3): 100 mg via ORAL
  Filled 2014-09-03 (×3): qty 1

## 2014-09-03 MED ORDER — DARBEPOETIN ALFA 100 MCG/0.5ML IJ SOSY: 100.0000 ug | PREFILLED_SYRINGE | INTRAMUSCULAR | Status: DC

## 2014-09-03 MED ORDER — CARVEDILOL 25 MG PO TABS
25.0000 mg | ORAL_TABLET | Freq: Two times a day (BID) | ORAL | Status: DC
  Administered 2014-09-03 – 2014-09-05 (×5): 25 mg via ORAL
  Filled 2014-09-03 (×7): qty 1

## 2014-09-03 MED ORDER — DOCUSATE SODIUM 100 MG PO CAPS
100.0000 mg | ORAL_CAPSULE | Freq: Every day | ORAL | Status: DC
  Administered 2014-09-03 – 2014-09-05 (×3): 100 mg via ORAL
  Filled 2014-09-03 (×3): qty 1

## 2014-09-03 NOTE — Progress Notes (Addendum)
Subjective: No events overnight  Objective: Vital signs in last 24 hours: Temp:  [97.7 F (36.5 C)-98.8 F (37.1 C)] 98.4 F (36.9 C) (02/08 0413) Pulse Rate:  [79-98] 83 (02/08 0413) Resp:  [8-17] 16 (02/07 1959) BP: (166-245)/(63-105) 180/63 mmHg (02/08 0413) SpO2:  [88 %-99 %] 90 % (02/08 0413) Weight:  [143 lb 1.3 oz (64.9 kg)] 143 lb 1.3 oz (64.9 kg) (02/08 0413) Weight change: 7.1 oz (0.2 kg) Last BM Date: 09/01/14  CBG (last 3)   Recent Labs  09/02/14 1451 09/02/14 1630 09/02/14 2117  GLUCAP 120* 103* 197*    Intake/Output from previous day:  Intake/Output Summary (Last 24 hours) at 09/03/14 0730 Last data filed at 09/02/14 1700  Gross per 24 hour  Intake    240 ml  Output    600 ml  Net   -360 ml   02/07 0701 - 02/08 0700 In: 240 [P.O.:240] Out: 600 [Urine:600]   Physical Exam  General appearance: alert and O.  Not in distress.    Eyes: no scleral icterus Throat: oropharynx moist without erythema Resp: Mild Rales Cardio: Reg, gallop, 1/6 m, + JVD GI: soft, non-tender;  Extremities: no clubbing, cyanosis or edema L Arm Fistula +Thrill and Bruit   Lab Results:  Recent Labs  09/01/14 0632 09/03/14 0400  NA 136 137  K 3.3* 3.8  CL 99 99  CO2 28 29  GLUCOSE 155* 112*  BUN 60* 61*  CREATININE 3.31* 4.09*  CALCIUM 8.8 9.2  PHOS  --  4.6     Recent Labs  09/01/14 0632 09/03/14 0400  AST 20 19  ALT 16 11  ALKPHOS 90 80  BILITOT 0.5 0.6  PROT 6.7 6.4  ALBUMIN 3.0* 3.0*     Recent Labs  08/31/14 1940  09/01/14 0632  09/02/14 0421 09/03/14 0400  WBC 11.0*  < > 10.6*  < > 10.1 8.9  NEUTROABS 7.8*  --  6.5  --   --   --   HGB 9.8*  < > 8.2*  < > 10.0* 9.3*  HCT 29.7*  < > 25.2*  < > 30.5* 29.2*  MCV 84.1  < > 85.4  < > 85.9 89.3  PLT 317  < > 276  < > 268 270  < > = values in this interval not displayed.  Lab Results  Component Value Date   INR 0.96 01/09/2014   INR 0.91 07/20/2013   INR 0.95 05/23/2013     Recent  Labs  09/01/14 0124 09/01/14 0632 09/01/14 1200  TROPONINI 0.03 0.04* 0.04*    No results for input(s): TSH, T4TOTAL, T3FREE, THYROIDAB in the last 72 hours.  Invalid input(s): FREET3   Recent Labs  09/01/14 0900  VITAMINB12 465  FOLATE 18.5  FERRITIN 280  TIBC 433  IRON 313*  RETICCTPCT 1.6    Micro Results: Recent Results (from the past 240 hour(s))  MRSA PCR Screening     Status: None   Collection Time: 09/01/14  7:16 AM  Result Value Ref Range Status   MRSA by PCR NEGATIVE NEGATIVE Final    Comment:        The GeneXpert MRSA Assay (FDA approved for NASAL specimens only), is one component of a comprehensive MRSA colonization surveillance program. It is not intended to diagnose MRSA infection nor to guide or monitor treatment for MRSA infections.      Studies/Results: US Abdomen Complete  09/02/2014   CLINICAL DATA:  Vomiting  EXAM: ULTRASOUND ABDOMEN  COMPLETE  COMPARISON:  None.  FINDINGS: Gallbladder: 5 mm gallstone. No gallbladder wall thickening or pericholecystic fluid. Negative sonographic Murphy's sign.  Common bile duct: Diameter: 4 mm  Liver: No focal lesion identified. Within normal limits in parenchymal echogenicity.  IVC: No abnormality visualized.  Pancreas: Poorly visualized.  Spleen: Size and appearance within normal limits.  Right Kidney: Length: 10.7 cm.  No mass or hydronephrosis.  Left Kidney: Length: 9.9 cm.  No mass or hydronephrosis.  Abdominal aorta: Atherosclerosis.  No aneurysm visualized.  Other findings: None.  IMPRESSION: Cholelithiasis, without associated sonographic findings to suggest acute cholecystitis.   Electronically Signed   By: Julian Hy M.D.   On: 09/02/2014 16:49   Dg Chest Port 1 View  09/02/2014   CLINICAL DATA:  CHF, weakness, history of hypertension, asthma, diabetes, former smoker  EXAM: PORTABLE CHEST - 1 VIEW  COMPARISON:  08/31/2014; 07/30/2014; 05/15/2014  FINDINGS: Grossly unchanged enlarged cardiac silhouette.  Overall improved aeration of the lungs. Mild pulmonary venous congestion without frank evidence of edema. No pleural effusion or pneumothorax. No focal airspace opacities. Unchanged bones.  IMPRESSION: 1. Improved aeration along suggests resolving edema and atelectasis. 2. Cardiomegaly and pulmonary venous congestion without acute cardiopulmonary disease.   Electronically Signed   By: Sandi Mariscal M.D.   On: 09/02/2014 08:41   Dg Abd Portable 1v  09/02/2014   CLINICAL DATA:  Vomiting  EXAM: PORTABLE ABDOMEN - 1 VIEW  COMPARISON:  None.  FINDINGS: Nonobstructive bowel gas pattern.  Moderate colonic stool burden.  Degenerative changes of the lumbar spine.  Vascular calcifications.  IMPRESSION: No evidence of bowel obstruction.  Moderate colonic stool burden.   Electronically Signed   By: Julian Hy M.D.   On: 09/02/2014 15:47     Medications: Scheduled: . amLODipine  10 mg Oral Daily  . aspirin  81 mg Oral Daily  . atorvastatin  20 mg Oral q1800  . budesonide-formoterol  1 puff Inhalation QPM  . calcitRIOL  0.25 mcg Oral Daily  . [START ON 09/06/2014] darbepoetin (ARANESP) injection - NON-DIALYSIS  40 mcg Subcutaneous Weekly  . enoxaparin (LOVENOX) injection  30 mg Subcutaneous Daily  . famotidine  20 mg Oral Daily  . furosemide  80 mg Oral BID  . insulin aspart  0-5 Units Subcutaneous QHS  . insulin aspart  0-9 Units Subcutaneous TID WC  . insulin detemir  10 Units Subcutaneous QHS  . isosorbide-hydrALAZINE  2 tablet Oral TID  . labetalol  15 mg Intravenous Q6H  . potassium chloride  20 mEq Oral BID  . sodium chloride  3 mL Intravenous Q12H   Continuous:    Assessment/Plan: Active Problems:   Asthma   Acute respiratory failure   Hypertensive urgency   Type 2 diabetes mellitus with diabetic nephropathy   CHF, acute on chronic   Acute CHF   CHF exacerbation   Hypertensive urgency/Flash Pulm Edema/Acute Hypoxic resp Failure/Acute diastolic heart failure - in SDU while her BPs  remain in [id number] systolic. Was intolerant to most HTN meds yesterday AM, assess compliance today. ACE on hold in setting of worsening CKD. Cards placed patient on torsemide based on lack of response to lasix in outpatient setting. Then, nephro placed patient back on lasix yesterday. Cards and nephro to determine optimal regimen for patient, and please consider IV route given her waxing/waning emesis    Stage V CKD - progressing. Cr now up to 4. awaiting timing of AVF maturation and iHD initiation, per nephro.  Dr Lorrene Reid is Nephro.  Has Near Mature AVF in place.  Saw Vasc Surg 2/3 . Appreciate nephro help. Uremia today.  BP still poor.  Cards and I question need for early iHD  Constipation - miralax and colace   Anemia - s/p arenesp and 1U PRBC . Back to 9s today. Watching and xfuse < 8.   Asthma - albuterol as needed   Prophylaxis: Lovenox, Protonix  CODE STATUS: FULL CODE   Type 2 diabetes mellitus with diabetic nephropathy - sensitive sliding scale continue home insulin  -   Recent Labs  09/02/14 1451 09/02/14 1630 09/02/14 2117  GLUCAP 120* 103* 197*      LOS: 3 days   Destiny Day 09/03/2014, 7:30 AM

## 2014-09-03 NOTE — Progress Notes (Signed)
Admit: 08/31/2014 LOS: 3  51F w/ CKD4 (BL 2.9-3.5) admit with HTN urgency, SOB/Pulm Edema, and mild AoCKD.    Subjective:  Feels improved this AM No further N/V, thinks prev was related to PO narcotic No SOB BP still up Ate entirety of breakfast Denies preceding N/V, anorexia before acutely ill  Weight unchanged I/Os minimal negative  02/07 0701 - 02/08 0700 In: 240 [P.O.:240] Out: 600 [Urine:600]  Filed Weights   09/01/14 0726 09/02/14 0406 09/03/14 0413  Weight: 64.7 kg (142 lb 10.2 oz) 64.7 kg (142 lb 10.2 oz) 64.9 kg (143 lb 1.3 oz)    Scheduled Meds: . amLODipine  10 mg Oral Daily  . aspirin  81 mg Oral Daily  . atorvastatin  20 mg Oral q1800  . budesonide-formoterol  1 puff Inhalation QPM  . calcitRIOL  0.25 mcg Oral Daily  . [START ON 09/06/2014] darbepoetin (ARANESP) injection - NON-DIALYSIS  40 mcg Subcutaneous Weekly  . docusate sodium  100 mg Oral Daily  . enoxaparin (LOVENOX) injection  30 mg Subcutaneous Daily  . famotidine  20 mg Oral Daily  . furosemide  80 mg Oral BID  . insulin aspart  0-5 Units Subcutaneous QHS  . insulin aspart  0-9 Units Subcutaneous TID WC  . insulin detemir  10 Units Subcutaneous QHS  . isosorbide-hydrALAZINE  2 tablet Oral TID  . labetalol  15 mg Intravenous Q6H  . polyethylene glycol  17 g Oral Daily  . potassium chloride  20 mEq Oral BID  . sodium chloride  3 mL Intravenous Q12H   Continuous Infusions:  PRN Meds:.sodium chloride, acetaminophen, hydrALAZINE, metolazone, ondansetron (ZOFRAN) IV, oxyCODONE, promethazine, senna-docusate, sodium chloride  Current Labs: reviewed    Physical Exam:  Blood pressure 180/63, pulse 83, temperature 98.4 F (36.9 C), temperature source Oral, resp. rate 16, height 5\' 5"  (1.651 m), weight 64.9 kg (143 lb 1.3 oz), SpO2 90 %. NAD, pleasant CTAB, nl wob, no crackles RRR, nl s1s2 No LEE S/nt/nd, nabs NCAT EOMI  A/P 1. AoCKD 1. BL SCr low to mid 3s 2. Has L BC AVF VVS [place] 3. Pt GI  Sx not clearly related to uremia and imrpvoed today 4. She does not need HD, she needs BP and Vol control 5. Follows with Lorrene Reid [facility] 6. AVF + B/T 2. HTN, uncontrolled 1. Home meds amlodipine, carvedilol, lasix 60 BID, irbesartan 150 daily, prn metolazone 2. ARB held at admission 3. Prev RAS w/u negative 06/2013 Renal Duplex 4. Remains elevated 5. Return to PO regimen today, stop IV labetabol 1. Restart carvedilol 25 BID 2. On ISDN/Hydralazine 3. Stop PO lasix, resume Torsemide 100mg  PO daily 4. Next medication would likely be clonidine or alpha blocker 6. Follow I/Os, can use prn metolazone  7. Dietary Na restriction, have dietician see as well. 3. Pulm Edema, improving 1. ? Dietary indiscretion of Na 2. As above  Pearson Grippe MD 09/03/2014, 8:56 AM   Recent Labs Lab 08/31/14 1940 09/01/14 0124 09/01/14 0632 09/03/14 0400  NA 136  --  136 137  K 3.5  --  3.3* 3.8  CL 99  --  99 99  CO2 27  --  28 29  GLUCOSE 238*  --  155* 112*  BUN 62*  --  60* 61*  CREATININE 3.26* 3.30* 3.31* 4.09*  CALCIUM 9.4  --  8.8 9.2  PHOS  --   --   --  4.6    Recent Labs Lab 08/31/14 1940  09/01/14 [id number] 09/01/14  0900 09/02/14 0421 09/03/14 0400  WBC 11.0*  < > 10.6* 9.5 10.1 8.9  NEUTROABS 7.8*  --  6.5  --   --   --   HGB 9.8*  < > 8.2* 7.8* 10.0* 9.3*  HCT 29.7*  < > 25.2* 24.2* 30.5* 29.2*  MCV 84.1  < > 85.4 85.2 85.9 89.3  PLT 317  < > 276 272 268 270  < > = values in this interval not displayed.

## 2014-09-03 NOTE — Progress Notes (Addendum)
[place] for Aranesp Indication: Anemia in CKD  Allergies  Allergen Reactions  . Lisinopril Cough  . Prednisone Other (See Comments)    Excessive fluid buildup    Patient Measurements: Height: 5\' 5"  (165.1 cm) Weight: 143 lb 1.3 oz (64.9 kg) IBW/kg (Calculated) : 57   Vital Signs: Temp: 98.2 F (36.8 C) (02/08 1222) Temp Source: Oral (02/08 1222) BP: 199/72 mmHg (02/08 1222) Pulse Rate: 88 (02/08 0754) Intake/Output from previous day: 02/07 0701 - 02/08 0700 In: 240 [P.O.:240] Out: 600 [Urine:600] Intake/Output from this shift: Total I/O In: -  Out: 750 [Urine:750]  Labs:  Recent Labs  09/01/14 0124 09/01/14 0632 09/01/14 0900 09/02/14 0421 09/03/14 0400  WBC 10.9* 10.6* 9.5 10.1 8.9  HGB 8.3* 8.2* 7.8* 10.0* 9.3*  HCT 25.6* 25.2* 24.2* 30.5* 29.2*  PLT 252 276 272 268 270  CREATININE 3.30* 3.31*  --   --  4.09*  PHOS  --   --   --   --  4.6  ALBUMIN  --  3.0*  --   --  3.0*  PROT  --  6.7  --   --  6.4  AST  --  20  --   --  19  ALT  --  16  --   --  11  ALKPHOS  --  90  --   --  80  BILITOT  --  0.5  --   --  0.6   Estimated Creatinine Clearance: 12.7 mL/min (by C-G formula based on Cr of 4.09).   Microbiology: Recent Results (from the past 720 hour(s))  MRSA PCR Screening     Status: None   Collection Time: 09/01/14  7:16 AM  Result Value Ref Range Status   MRSA by PCR NEGATIVE NEGATIVE Final    Comment:        The GeneXpert MRSA Assay (FDA approved for NASAL specimens only), is one component of a comprehensive MRSA colonization surveillance program. It is not intended to diagnose MRSA infection nor to guide or monitor treatment for MRSA infections.     Assessment: 64yo female with CKD who presented w/ SOB, admitted for CHF exacerbation.  Pharmacy consulted to dose Aranesp.  Pt not yet on HD. Pt previously receiving Aranesp 143mcg qWed q28days last dose 08/30/2014 in [facility] Stay at [facility].   Plan:  -With last Aranesp dose 2/4 and on a q28 day schedule next dose is due 09/27/2014 -Will change aranesp to 100mg  q28days to maintain the outpatient schedule -Will consider aranesp 73mcg sq weekly if patient remains here for a prolonged period  Hildred Laser, Pharm D 09/03/2014 2:47 PM

## 2014-09-03 NOTE — Progress Notes (Signed)
Subjective:  Feels much better. Dyspnea improved and no chest pain  Objective:  Vital Signs in the last 24 hours: Temp:  [97.7 F (36.5 C)-98.8 F (37.1 C)] 97.9 F (36.6 C) (02/08 0754) Pulse Rate:  [83-98] 88 (02/08 0754) Resp:  [16-17] 16 (02/07 1959) BP: (166-237)/(63-82) 225/80 mmHg (02/08 0754) SpO2:  [90 %-99 %] 90 % (02/08 0754) Weight:  [64.9 kg (143 lb 1.3 oz)] 64.9 kg (143 lb 1.3 oz) (02/08 0413)  Intake/Output from previous day: 02/07 0701 - 02/08 0700 In: 240 [P.O.:240] Out: 600 [Urine:600]  Physical Exam:   General appearance: alert, cooperative, appears stated age and no distress Lungs:  Clear bilaterally Chest wall: no tenderness Heart: S1, S2 normal, S4 present and No murmur or rub. Abdomen: soft, non-tender; bowel sounds normal; no masses, no organomegaly Extremities: extremities normal, atraumatic, no cyanosis or edema Pulses: 2+ and symmetric , pedal pulse 1+ bilateral, left axillary a fistula present with continuous hum Neurologic: Grossly normal    Lab Results: BMP  Recent Labs  08/31/14 1940 09/01/14 0124 09/01/14 0632 09/03/14 0400  NA 136  --  136 137  K 3.5  --  3.3* 3.8  CL 99  --  99 99  CO2 27  --  28 29  GLUCOSE 238*  --  155* 112*  BUN 62*  --  60* 61*  CREATININE 3.26* 3.30* 3.31* 4.09*  CALCIUM 9.4  --  8.8 9.2  GFRNONAA 14* 14* 14* 11*  GFRAA 16* 16* 16* 12*    CBC  Recent Labs Lab 09/01/14 0632  09/03/14 0400  WBC 10.6*  < > 8.9  RBC 2.95*  < > 3.27*  HGB 8.2*  < > 9.3*  HCT 25.2*  < > 29.2*  PLT 276  < > 270  MCV 85.4  < > 89.3  MCH 27.8  < > 28.4  MCHC 32.5  < > 31.8  RDW 13.4  < > 14.0  LYMPHSABS 3.2  --   --   MONOABS 0.7  --   --   EOSABS 0.2  --   --   BASOSABS 0.0  --   --   < > = values in this interval not displayed.  HEMOGLOBIN A1C Lab Results  Component Value Date   HGBA1C 9.2* 07/31/2014   MPG 217* 07/31/2014    Cardiac Panel (last 3 results)  Recent Labs  09/01/14 0124  09/01/14 0632 09/01/14 1200  TROPONINI 0.03 0.04* 0.04*    BNP (last 3 results)  Recent Labs  01/09/14 2320 01/16/14 2132 03/22/14 2338  PROBNP 20261.0* 6445.0* 3101.0*     Hepatic Function Panel  Recent Labs  08/01/14 0316 09/01/14 0632 09/03/14 0400  PROT 5.8* 6.7 6.4  ALBUMIN 2.7* 3.0* 3.0*  AST 18 20 19   ALT 12 16 11   ALKPHOS 85 90 80  BILITOT 0.5 0.5 0.6     Cardiac Studies:  Echo: Low normal LVEF, EF 50%. Mod LVY grade 3 diastolic dysfunction with elevated LA/LV end diastolic pressure and dilated IVC suggests elevated right heart pressure. No change from 01/10/2014.   EKG: Normal sinus rhythm, normal axis. LVH with repolarization abnormality, cannot exclude lateral ischemia. No change from prior EKG.  Assessment/Plan:  1. Hypertensive emergency with pulmonary edema Renal angio 2006 no RAS. Renal duplex x 2 negative for RAS, last I think was in 2005. 2. Hypertensive heart and renovascular disease, stage 4 CKD 3. DM-2 uncontrolled  Rec: Agree with Dr. Joelyn Oms with medical management  of hypertension. However, if she were to have recurrence of pulmonary edema, her only option I feel is starting dialysis. Will sign off. Call if questions   Laverda Page, M.D. 09/03/2014, 9:31 AM [facility] Pager: [phone] Office: [phone] If no answer: [phone]

## 2014-09-04 LAB — GLUCOSE, CAPILLARY
Glucose-Capillary: 92 mg/dL (ref 70–99)
Glucose-Capillary: 127 mg/dL — ABNORMAL HIGH (ref 70–99)
Glucose-Capillary: 154 mg/dL — ABNORMAL HIGH (ref 70–99)
Glucose-Capillary: 174 mg/dL — ABNORMAL HIGH (ref 70–99)
Glucose-Capillary: 128 mg/dL — ABNORMAL HIGH (ref 70–99)
Glucose-Capillary: 138 mg/dL — ABNORMAL HIGH (ref 70–99)
Glucose-Capillary: 236 mg/dL — ABNORMAL HIGH (ref 70–99)

## 2014-09-04 LAB — CBC
HCT: 28.6 % — ABNORMAL LOW (ref 36.0–46.0)
Hemoglobin: 9.2 g/dL — ABNORMAL LOW (ref 12.0–15.0)
MCH: 28.4 pg (ref 26.0–34.0)
MCHC: 32.2 g/dL (ref 30.0–36.0)
MCV: 88.3 fL (ref 78.0–100.0)
Platelets: 276 10*3/uL (ref 150–400)
RBC: 3.24 MIL/uL — ABNORMAL LOW (ref 3.87–5.11)
RDW: 14.3 % (ref 11.5–15.5)
WBC: 8.7 10*3/uL (ref 4.0–10.5)

## 2014-09-04 LAB — COMPREHENSIVE METABOLIC PANEL
ALT: 12 U/L (ref 0–35)
AST: 18 U/L (ref 0–37)
Albumin: 3 g/dL — ABNORMAL LOW (ref 3.5–5.2)
Alkaline Phosphatase: 75 U/L (ref 39–117)
Anion gap: 14 (ref 5–15)
BUN: 67 mg/dL — ABNORMAL HIGH (ref 6–23)
CO2: 25 mmol/L (ref 19–32)
Calcium: 9.2 mg/dL (ref 8.4–10.5)
Chloride: 97 mmol/L (ref 96–112)
Creatinine, Ser: 4.14 mg/dL — ABNORMAL HIGH (ref 0.50–1.10)
GFR calc Af Amer: 12 mL/min — ABNORMAL LOW (ref >90)
GFR calc non Af Amer: 11 mL/min — ABNORMAL LOW (ref >90)
Glucose, Bld: 126 mg/dL — ABNORMAL HIGH (ref 70–99)
Potassium: 4.2 mmol/L (ref 3.5–5.1)
Sodium: 136 mmol/L (ref 135–145)
Total Bilirubin: 0.3 mg/dL (ref 0.3–1.2)
Total Protein: 6.5 g/dL (ref 6.0–8.3)

## 2014-09-04 MED ORDER — METOLAZONE 5 MG PO TABS
5.0000 mg | ORAL_TABLET | Freq: Every day | ORAL | Status: DC
  Administered 2014-09-04: 5 mg via ORAL
  Filled 2014-09-04 (×2): qty 1

## 2014-09-04 NOTE — Plan of Care (Signed)
Problem: Food- and Nutrition-Related Knowledge Deficit (NB-1.1) Goal: Nutrition education Formal process to instruct or train a patient/client in a skill or to impart knowledge to help patients/clients voluntarily manage or modify food choices and eating behavior to maintain or improve health. Outcome: Adequate for Discharge Nutrition Education Note  RD consulted for nutrition education regarding low sodium diet.  RD provided "Low Sodium Nutrition Therapy" and "Sodium Free Seasonings" handouts from the Academy of [employer]. Reviewed patient's dietary recall. Provided examples on ways to decrease sodium intake in diet. Discouraged intake of processed foods and use of salt shaker. Encouraged fresh fruits and vegetables as well as whole grain sources of carbohydrates to maximize fiber intake.   RD discussed why it is important for patient to adhere to diet recommendations, and emphasized the role of fluids and foods to avoid.. Teach back method used.  Expect good compliance.  Body mass index is 23.96 kg/(m^2). Pt meets criteria for normal weight based on current BMI.  Current diet order is Carb modified, Heart Healthy, patient is consuming approximately 100% of meals at this time. Labs and medications reviewed. No further nutrition interventions warranted at this time. RD contact information provided. If additional nutrition issues arise, please re-consult RD.   Laiylah Roettger A. Jimmye Norman, RD, LDN, CDE Pager: [phone] After hours Pager: [phone]

## 2014-09-04 NOTE — Progress Notes (Signed)
Admit: 08/31/2014 LOS: 4  88F w/ CKD4 (BL 2.9-3.5) admit with HTN urgency, SOB/Pulm Edema, and mild AoCKD.    Subjective:  BPs overall improved On scheduled torsemide and metolazone Clonidine started this AM Not all UOP recorded Good appetite, no N/V, no uremia  02/08 0701 - 02/09 0700 In: -  Out: 750 [Urine:750]  Filed Weights   09/02/14 0406 09/03/14 0413 09/04/14 0404  Weight: 64.7 kg (142 lb 10.2 oz) 64.9 kg (143 lb 1.3 oz) 65.3 kg (143 lb 15.4 oz)    Scheduled Meds: . amLODipine  10 mg Oral Daily  . aspirin  81 mg Oral Daily  . atorvastatin  20 mg Oral q1800  . budesonide-formoterol  1 puff Inhalation QPM  . calcitRIOL  0.25 mcg Oral Daily  . carvedilol  25 mg Oral BID WC  . cloNIDine  0.1 mg Oral TID  . [START ON 09/27/2014] darbepoetin (ARANESP) injection - NON-DIALYSIS  100 mcg Subcutaneous Q28 days  . docusate sodium  100 mg Oral Daily  . enoxaparin (LOVENOX) injection  30 mg Subcutaneous Daily  . famotidine  20 mg Oral Daily  . insulin aspart  0-5 Units Subcutaneous QHS  . insulin aspart  0-9 Units Subcutaneous TID WC  . insulin detemir  10 Units Subcutaneous QHS  . isosorbide-hydrALAZINE  2 tablet Oral TID  . metolazone  5 mg Oral Daily  . polyethylene glycol  17 g Oral Daily  . potassium chloride  20 mEq Oral BID  . sodium chloride  3 mL Intravenous Q12H  . torsemide  100 mg Oral Daily   Continuous Infusions:  PRN Meds:.sodium chloride, acetaminophen, hydrALAZINE, ondansetron (ZOFRAN) IV, oxyCODONE, oxyCODONE-acetaminophen, promethazine, senna-docusate, sodium chloride  Current Labs: reviewed    Physical Exam:  Blood pressure 226/91, pulse 78, temperature 97.9 F (36.6 C), temperature source Oral, resp. rate 18, height 5\' 5"  (1.651 m), weight 65.3 kg (143 lb 15.4 oz), SpO2 99 %. NAD, pleasant CTAB, nl wob, no crackles RRR, nl s1s2 No LEE S/nt/nd, nabs NCAT EOMI  A/P 1. AoCKD 1. BL SCr low to mid 3s 2. Has L BC AVF VVS [place] 3. Not currently  uremic 4. Need to cont to work towards BP / Vol control with diuretics and BP meds 5. Follows with Lorrene Reid [facility] 6. AVF + B/T 2. HTN, uncontrolled 1. Home meds amlodipine, carvedilol, lasix 60 BID, irbesartan 150 daily, prn metolazone 2. ARB held at admission 3. Prev RAS w/u negative 06/2013 Renal Duplex 4. Remains elevated 1. Clonidine added today 2. Follow 5. Follow I/Os, 6. Dietary Na restriction, have dietician see as well. 3. Pulm Edema, improving 1. ? Dietary indiscretion of Na 2. As above  Pearson Grippe MD 09/04/2014, 9:48 AM   Recent Labs Lab 09/01/14 0632 09/03/14 0400 09/04/14 0259  NA 136 137 136  K 3.3* 3.8 4.2  CL 99 99 97  CO2 28 29 25   GLUCOSE 155* 112* 126*  BUN 60* 61* 67*  CREATININE 3.31* 4.09* 4.14*  CALCIUM 8.8 9.2 9.2  PHOS  --  4.6  --     Recent Labs Lab 08/31/14 1940  09/01/14 0632  09/02/14 0421 09/03/14 0400 09/04/14 0259  WBC 11.0*  < > 10.6*  < > 10.1 8.9 8.7  NEUTROABS 7.8*  --  6.5  --   --   --   --   HGB 9.8*  < > 8.2*  < > 10.0* 9.3* 9.2*  HCT 29.7*  < > 25.2*  < > 30.5*  29.2* 28.6*  MCV 84.1  < > 85.4  < > 85.9 89.3 88.3  PLT 317  < > 276  < > 268 270 276  < > = values in this interval not displayed.

## 2014-09-04 NOTE — Evaluation (Signed)
Physical Therapy Evaluation Patient Details Name: Destiny Day MRN: [id number] DOB: [birth year] Today's Date: 09/04/2014   History of Present Illness  Pt adm with hypertensive urgency/Flash Pulm Edema. Pt also with acute on chronic heart failure and respiratory failure. PMH - HTN, DM, CHF, asthma, arthritis  Clinical Impression  Pt admitted with above diagnosis. Pt currently with functional limitations due to the deficits listed below (see PT Problem List).  Pt will benefit from skilled PT to increase their independence and safety with mobility to allow discharge to the venue listed below.  Will follow acutely but no PT after dc recommended.     Follow Up Recommendations No PT follow up    Equipment Recommendations  None recommended by PT    Recommendations for Other Services       Precautions / Restrictions Precautions Precautions: None Restrictions Weight Bearing Restrictions: No      Mobility  Bed Mobility                  Transfers Overall transfer level: Modified independent   Transfers: Sit to/from Stand Sit to Stand: Modified independent (Device/Increase time)            Ambulation/Gait Ambulation/Gait assistance: Min guard Ambulation Distance (Feet): 140 Feet Assistive device: Rolling walker (2 wheeled) Gait Pattern/deviations: Step-through pattern;Decreased stride length;Trunk flexed   Gait velocity interpretation: Below normal speed for age/gender General Gait Details: Pt fatigues quickly with activity and required 1 standing rest break.  Stairs            Wheelchair Mobility    Modified Rankin (Stroke Patients Only)       Balance Overall balance assessment: Needs assistance Sitting-balance support: No upper extremity supported Sitting balance-Leahy Scale: Normal     Standing balance support: No upper extremity supported Standing balance-Leahy Scale: Good                               Pertinent Vitals/Pain  Pain Assessment: No/denies pain    Home Living Family/patient expects to be discharged to:: Private residence Living Arrangements: Spouse/significant other Available Help at Discharge: Family;Available PRN/intermittently Type of Home: House Home Access: Level entry     Home Layout: Two level;Bed/bath upstairs Home Equipment: Walker - 2 wheels;Cane - single point      Prior Function Level of Independence: Independent with assistive device(s)         Comments: Pt amb with walker, cane, or no assistive device depending on how she feels.     Hand Dominance   Dominant Hand: Right    Extremity/Trunk Assessment   Upper Extremity Assessment: Overall WFL for tasks assessed           Lower Extremity Assessment: Generalized weakness         Communication   Communication: No difficulties  Cognition Arousal/Alertness: Awake/alert Behavior During Therapy: WFL for tasks assessed/performed Overall Cognitive Status: Within Functional Limits for tasks assessed                      General Comments      Exercises        Assessment/Plan    PT Assessment Patient needs continued PT services  PT Diagnosis Difficulty walking;Generalized weakness   PT Problem List Decreased strength;Decreased activity tolerance;Decreased balance;Decreased mobility  PT Treatment Interventions DME instruction;Gait training;Functional mobility training;Therapeutic activities;Therapeutic exercise;Patient/family education;Balance training   PT Goals (Current goals can be found in the Care  Plan section) Acute Rehab PT Goals Patient Stated Goal: return home PT Goal Formulation: With patient Time For Goal Achievement: 09/11/14 Potential to Achieve Goals: Good    Frequency Min 3X/week   Barriers to discharge        Co-evaluation               End of Session   Activity Tolerance: Patient limited by fatigue Patient left: in bed;with call bell/phone within reach Nurse  Communication: Mobility status         Time: [id number] PT Time Calculation (min) (ACUTE ONLY): 8 min   Charges:   PT Evaluation $Initial PT Evaluation Tier I: 1 Procedure     PT G Codes:        Marieclaire Bettenhausen 09-28-14, 3:51 PM  [facility] PT [phone]

## 2014-09-04 NOTE — Progress Notes (Signed)
Subjective: No events overnight  Objective: Vital signs in last 24 hours: Temp:  [97.6 F (36.4 C)-98.3 F (36.8 C)] 98.3 F (36.8 C) (02/09 0404) Pulse Rate:  [70-78] 70 (02/09 0404) Resp:  [14-16] 16 (02/08 1652) BP: (147-221)/(63-92) 147/63 mmHg (02/09 0404) SpO2:  [95 %-99 %] 95 % (02/09 0002) Weight:  [143 lb 15.4 oz (65.3 kg)] 143 lb 15.4 oz (65.3 kg) (02/09 0404) Weight change: 14.1 oz (0.4 kg) Last BM Date: 09/01/14  CBG (last 3)   Recent Labs  09/02/14 1451 09/02/14 1630 09/02/14 2117  GLUCAP 120* 103* 197*    Intake/Output from previous day:  Intake/Output Summary (Last 24 hours) at 09/04/14 0805 Last data filed at 09/03/14 1200  Gross per 24 hour  Intake      0 ml  Output    750 ml  Net   -750 ml   02/08 0701 - 02/09 0700 In: -  Out: 750 [Urine:750]   Physical Exam  General appearance: alert and O.  Not in distress.    Eyes: no scleral icterus Throat: oropharynx moist without erythema Resp: Mild Rales Cardio: RRR, gallop, 1/6 m  GI: soft, non-tender;  Extremities: no clubbing, cyanosis or edema L Arm Fistula +Thrill and Bruit   Lab Results:  Recent Labs  09/03/14 0400 09/04/14 0259  NA 137 136  K 3.8 4.2  CL 99 97  CO2 29 25  GLUCOSE 112* 126*  BUN 61* 67*  CREATININE 4.09* 4.14*  CALCIUM 9.2 9.2  PHOS 4.6  --      Recent Labs  09/03/14 0400 09/04/14 0259  AST 19 18  ALT 11 12  ALKPHOS 80 75  BILITOT 0.6 0.3  PROT 6.4 6.5  ALBUMIN 3.0* 3.0*     Recent Labs  09/03/14 0400 09/04/14 0259  WBC 8.9 8.7  HGB 9.3* 9.2*  HCT 29.2* 28.6*  MCV 89.3 88.3  PLT 270 276    Lab Results  Component Value Date   INR 0.96 01/09/2014   INR 0.91 07/20/2013   INR 0.95 05/23/2013     Recent Labs  09/01/14 1200  TROPONINI 0.04*    No results for input(s): TSH, T4TOTAL, T3FREE, THYROIDAB in the last 72 hours.  Invalid input(s): FREET3   Recent Labs  09/01/14 0900  VITAMINB12 465  FOLATE 18.5  FERRITIN 280  TIBC  433  IRON 313*  RETICCTPCT 1.6    Micro Results: Recent Results (from the past 240 hour(s))  MRSA PCR Screening     Status: None   Collection Time: 09/01/14  7:16 AM  Result Value Ref Range Status   MRSA by PCR NEGATIVE NEGATIVE Final    Comment:        The GeneXpert MRSA Assay (FDA approved for NASAL specimens only), is one component of a comprehensive MRSA colonization surveillance program. It is not intended to diagnose MRSA infection nor to guide or monitor treatment for MRSA infections.      Studies/Results: US Abdomen Complete  09/02/2014   CLINICAL DATA:  Vomiting  EXAM: ULTRASOUND ABDOMEN COMPLETE  COMPARISON:  None.  FINDINGS: Gallbladder: 5 mm gallstone. No gallbladder wall thickening or pericholecystic fluid. Negative sonographic Murphy's sign.  Common bile duct: Diameter: 4 mm  Liver: No focal lesion identified. Within normal limits in parenchymal echogenicity.  IVC: No abnormality visualized.  Pancreas: Poorly visualized.  Spleen: Size and appearance within normal limits.  Right Kidney: Length: 10.7 cm.  No mass or hydronephrosis.  Left Kidney: Length: 9.9 cm.  No mass or hydronephrosis.  Abdominal aorta: Atherosclerosis.  No aneurysm visualized.  Other findings: None.  IMPRESSION: Cholelithiasis, without associated sonographic findings to suggest acute cholecystitis.   Electronically Signed   By: Julian Hy M.D.   On: 09/02/2014 16:49   Dg Abd Portable 1v  09/02/2014   CLINICAL DATA:  Vomiting  EXAM: PORTABLE ABDOMEN - 1 VIEW  COMPARISON:  None.  FINDINGS: Nonobstructive bowel gas pattern.  Moderate colonic stool burden.  Degenerative changes of the lumbar spine.  Vascular calcifications.  IMPRESSION: No evidence of bowel obstruction.  Moderate colonic stool burden.   Electronically Signed   By: Julian Hy M.D.   On: 09/02/2014 15:47     Medications: Scheduled: . amLODipine  10 mg Oral Daily  . aspirin  81 mg Oral Daily  . atorvastatin  20 mg Oral q1800   . budesonide-formoterol  1 puff Inhalation QPM  . calcitRIOL  0.25 mcg Oral Daily  . carvedilol  25 mg Oral BID WC  . cloNIDine  0.1 mg Oral TID  . [START ON 09/27/2014] darbepoetin (ARANESP) injection - NON-DIALYSIS  100 mcg Subcutaneous Q28 days  . docusate sodium  100 mg Oral Daily  . enoxaparin (LOVENOX) injection  30 mg Subcutaneous Daily  . famotidine  20 mg Oral Daily  . insulin aspart  0-5 Units Subcutaneous QHS  . insulin aspart  0-9 Units Subcutaneous TID WC  . insulin detemir  10 Units Subcutaneous QHS  . isosorbide-hydrALAZINE  2 tablet Oral TID  . metolazone  5 mg Oral Daily  . polyethylene glycol  17 g Oral Daily  . potassium chloride  20 mEq Oral BID  . sodium chloride  3 mL Intravenous Q12H  . torsemide  100 mg Oral Daily   Continuous:    Assessment/Plan: Active Problems:   Asthma   Acute respiratory failure   Hypertensive urgency   Type 2 diabetes mellitus with diabetic nephropathy   CHF, acute on chronic   Acute CHF   CHF exacerbation   Hypertensive urgency/Flash Pulm Edema -  Added clonidine and resumed coreg yesterday. SBPs now steadily < 200 for first time of admission. Transfer to tele bed today  Acute Hypoxic resp Failure/Acute diastolic heart failure - on torsemide + zaroxolyn. Weights are uptrending, so making zaroxolyn scheduled today. Daily weights.   Stage V CKD - progressing. Cr now up to 4s. Plan per nephro   Constipation - miralax and colace   Anemia - s/p arenesp and 1U PRBC . Hgb in 9s. Watching daily and xfuse < 8.   Asthma - albuterol as needed   Prophylaxis: Lovenox, Protonix  CODE STATUS: FULL CODE   Type 2 diabetes mellitus with diabetic nephropathy - sensitive sliding scale continue home insulin  -   Recent Labs  09/02/14 1451 09/02/14 1630 09/02/14 2117  GLUCAP 120* 103* 197*   Dispo - to tele bed today. PT/OT consult . Home in 1-2 days if renal function and weights stabilize    LOS: 4 days   Darnesha Diloreto,  Jamond Neels 09/04/2014, 8:05 AM

## 2014-09-05 LAB — COMPREHENSIVE METABOLIC PANEL
ALT: 13 U/L (ref 0–35)
AST: 18 U/L (ref 0–37)
Albumin: 3.1 g/dL — ABNORMAL LOW (ref 3.5–5.2)
Alkaline Phosphatase: 79 U/L (ref 39–117)
Anion gap: 13 (ref 5–15)
BUN: 66 mg/dL — ABNORMAL HIGH (ref 6–23)
CO2: 26 mmol/L (ref 19–32)
Calcium: 9.2 mg/dL (ref 8.4–10.5)
Chloride: 96 mmol/L (ref 96–112)
Creatinine, Ser: 4.31 mg/dL — ABNORMAL HIGH (ref 0.50–1.10)
GFR calc Af Amer: 12 mL/min — ABNORMAL LOW (ref >90)
GFR calc non Af Amer: 10 mL/min — ABNORMAL LOW (ref >90)
Glucose, Bld: 52 mg/dL — ABNORMAL LOW (ref 70–99)
Potassium: 4.1 mmol/L (ref 3.5–5.1)
Sodium: 135 mmol/L (ref 135–145)
Total Bilirubin: 0.5 mg/dL (ref 0.3–1.2)
Total Protein: 6.5 g/dL (ref 6.0–8.3)

## 2014-09-05 LAB — GLUCOSE, CAPILLARY
Glucose-Capillary: 107 mg/dL — ABNORMAL HIGH (ref 70–99)
Glucose-Capillary: 59 mg/dL — ABNORMAL LOW (ref 70–99)
Glucose-Capillary: 88 mg/dL (ref 70–99)
Glucose-Capillary: 122 mg/dL — ABNORMAL HIGH (ref 70–99)

## 2014-09-05 LAB — CBC
HCT: 29 % — ABNORMAL LOW (ref 36.0–46.0)
Hemoglobin: 9.3 g/dL — ABNORMAL LOW (ref 12.0–15.0)
MCH: 28 pg (ref 26.0–34.0)
MCHC: 32.1 g/dL (ref 30.0–36.0)
MCV: 87.3 fL (ref 78.0–100.0)
Platelets: 313 10*3/uL (ref 150–400)
RBC: 3.32 MIL/uL — ABNORMAL LOW (ref 3.87–5.11)
RDW: 14.1 % (ref 11.5–15.5)
WBC: 7.8 10*3/uL (ref 4.0–10.5)

## 2014-09-05 MED ORDER — TORSEMIDE 100 MG PO TABS: 100.0000 mg | ORAL_TABLET | Freq: Every day | ORAL | Status: DC

## 2014-09-05 MED ORDER — CLONIDINE HCL 0.1 MG PO TABS: 0.1000 mg | ORAL_TABLET | Freq: Three times a day (TID) | ORAL | Status: DC

## 2014-09-05 NOTE — Progress Notes (Signed)
Discharge instructions given to pt. Pt verbally acknowldged understanding. Pt voiced no questions when prompted.  Pt stated wanted to eat lunch prior to leaving hospital.  Friend at bedside who will provide private transportation for pt to home.   Will monitor    Angeline Slim I 09/05/2014 12:10 PM

## 2014-09-05 NOTE — Progress Notes (Signed)
Subjective: No events overnight  Objective: Vital signs in last 24 hours: Temp:  [97 F (36.1 C)-97.9 F (36.6 C)] 97 F (36.1 C) (02/10 0554) Pulse Rate:  [69-78] 70 (02/10 0554) Resp:  [18] 18 (02/10 0554) BP: (149-226)/(62-91) 168/75 mmHg (02/10 0554) SpO2:  [93 %-100 %] 93 % (02/10 0554) Weight:  [140 lb 11.2 oz (63.821 kg)-141 lb 1.6 oz (64.003 kg)] 140 lb 11.2 oz (63.821 kg) (02/10 0554) Weight change: -2 lb 13.8 oz (-1.298 kg) Last BM Date: 09/01/14  CBG (last 3)   Recent Labs  09/04/14 2057 09/05/14 0629 09/05/14 0658  GLUCAP 236* 59* 88    Intake/Output from previous day:  Intake/Output Summary (Last 24 hours) at 09/05/14 0749 Last data filed at 09/05/14 0703  Gross per 24 hour  Intake    480 ml  Output   1275 ml  Net   -795 ml   02/09 0701 - 02/10 0700 In: 480 [P.O.:480] Out: 1050 [Urine:1050]   Physical Exam  General appearance: alert and O.  Not in distress.    Eyes: no scleral icterus Throat: oropharynx moist without erythema Resp: Mild Rales Cardio: RRR, gallop, 1/6 m  GI: soft, non-tender;  Extremities: no clubbing, cyanosis or edema L Arm Fistula +Thrill and Bruit   Lab Results:  Recent Labs  09/03/14 0400 09/04/14 0259 09/05/14 0510  NA 137 136 135  K 3.8 4.2 4.1  CL 99 97 96  CO2 29 25 26   GLUCOSE 112* 126* 52*  BUN 61* 67* 66*  CREATININE 4.09* 4.14* 4.31*  CALCIUM 9.2 9.2 9.2  PHOS 4.6  --   --      Recent Labs  09/04/14 0259 09/05/14 0510  AST 18 18  ALT 12 13  ALKPHOS 75 79  BILITOT 0.3 0.5  PROT 6.5 6.5  ALBUMIN 3.0* 3.1*     Recent Labs  09/04/14 0259 09/05/14 0510  WBC 8.7 7.8  HGB 9.2* 9.3*  HCT 28.6* 29.0*  MCV 88.3 87.3  PLT 276 313    Lab Results  Component Value Date   INR 0.96 01/09/2014   INR 0.91 07/20/2013   INR 0.95 05/23/2013    No results for input(s): CKTOTAL, CKMB, CKMBINDEX, TROPONINI in the last 72 hours.  No results for input(s): TSH, T4TOTAL, T3FREE, THYROIDAB in the  last 72 hours.  Invalid input(s): FREET3  No results for input(s): VITAMINB12, FOLATE, FERRITIN, TIBC, IRON, RETICCTPCT in the last 72 hours.  Micro Results: Recent Results (from the past 240 hour(s))  MRSA PCR Screening     Status: None   Collection Time: 09/01/14  7:16 AM  Result Value Ref Range Status   MRSA by PCR NEGATIVE NEGATIVE Final    Comment:        The GeneXpert MRSA Assay (FDA approved for NASAL specimens only), is one component of a comprehensive MRSA colonization surveillance program. It is not intended to diagnose MRSA infection nor to guide or monitor treatment for MRSA infections.      Studies/Results: No results found.   Medications: Scheduled: . amLODipine  10 mg Oral Daily  . aspirin  81 mg Oral Daily  . atorvastatin  20 mg Oral q1800  . budesonide-formoterol  1 puff Inhalation QPM  . calcitRIOL  0.25 mcg Oral Daily  . carvedilol  25 mg Oral BID WC  . cloNIDine  0.1 mg Oral TID  . [START ON 09/27/2014] darbepoetin (ARANESP) injection - NON-DIALYSIS  100 mcg Subcutaneous Q28 days  . docusate  sodium  100 mg Oral Daily  . enoxaparin (LOVENOX) injection  30 mg Subcutaneous Daily  . famotidine  20 mg Oral Daily  . insulin aspart  0-5 Units Subcutaneous QHS  . insulin aspart  0-9 Units Subcutaneous TID WC  . insulin detemir  10 Units Subcutaneous QHS  . isosorbide-hydrALAZINE  2 tablet Oral TID  . polyethylene glycol  17 g Oral Daily  . potassium chloride  20 mEq Oral BID  . torsemide  100 mg Oral Daily   Continuous:    Assessment/Plan: Active Problems:   Asthma   Acute respiratory failure   Hypertensive urgency   Type 2 diabetes mellitus with diabetic nephropathy   CHF, acute on chronic   Acute CHF   CHF exacerbation   Hypertensive urgency/Flash Pulm Edema -  HTN is now controlled w/o spikes for over 24 hours. HTN is medically stable for discharge on current regimen   Acute Hypoxic resp Failure/Acute diastolic heart failure - her weight  is far below her dry weight. Her I/Os have been net neg daily for her entire admission. Dropping her metolazone today given uptrending Cr and adequate response to torsemide. Medically stable for discharge today on current regimen  Stage V CKD - Cr has increased over 1 mg/dl since admission, however nephrology only states we need to control #1 and #2 , which we have have done. Pt is following w/ Lorrene Reid as outpatient as she progresses towards iHD. This start date is nephrology's decision and they have made it clear that she does not currently meet criteria at this time. Unless nephrology has active plans today, other than trending her renal function  (which can be done outside the hospital) then she is medically clear for discharge today from medicine's standpoint w/ close nephrology f/u of her BUN/Cr until iHD is necessary. She has AVF in place on RUE w/ + thrill. This can be arranged by the nephrology team  Constipation - miralax and colace . Stable   Anemia - stable . s/p arenesp and 1U PRBC . Hgb in 9s. Watching daily and xfuse < 8.   Asthma - albuterol as needed   Prophylaxis: Lovenox, Protonix  CODE STATUS: FULL CODE   Type 2 diabetes mellitus with diabetic nephropathy - sensitive sliding scale continue home insulin  -   Recent Labs  09/04/14 2057 09/05/14 0629 09/05/14 0658  GLUCAP 236* 59* 88   Dispo - PT states no HH needed. Likely discharge home today unless active plans outlined by nephrology in their progress note today    LOS: 5 days   Destiny Day 09/05/2014, 7:49 AM

## 2014-09-05 NOTE — Progress Notes (Signed)
Pt transported out of unit per wheelchair by guest services. No acute distress noted.  Friend at side with belongings in hand.  Angeline Slim I 09/05/2014 12:39 PM

## 2014-09-05 NOTE — Progress Notes (Signed)
Hypoglycemic Event  CBG: 59  Treatment: 15 GM carbohydrate snack  Symptoms: Hungry, dry mouth  Follow-up CBG: [id number] CBG Result:88  Possible Reasons for Event: Inadequate meal intake  Comments/MD notified:    Ronnette Hila  Remember to initiate Hypoglycemia Order Set & complete

## 2014-09-05 NOTE — Discharge Summary (Signed)
Physician Discharge Summary    YIXUAN PELAEZ  MR#: [id number]  DOB:Apr 08, 1951  Date of Admission: 08/31/2014 Date of Discharge: 09/05/2014  Attending Physician:Maliki Gignac  Patient's [id number], Nicki Reaper, MD  Consults:Treatment Team:  Windy Kalata, MD    Discharge Diagnoses: Active Problems:   Asthma   Acute respiratory failure   Hypertensive urgency   Type 2 diabetes mellitus with diabetic nephropathy   CHF, acute on chronic   Acute CHF   CHF exacerbation   Discharge Medications:   Medication List    STOP taking these medications        furosemide 40 MG tablet  Commonly known as:  LASIX     irbesartan 150 MG tablet  Commonly known as:  AVAPRO     metolazone 5 MG tablet  Commonly known as:  ZAROXOLYN      TAKE these medications        amLODipine 10 MG tablet  Commonly known as:  NORVASC  Take 1 tablet (10 mg total) by mouth daily.     aspirin 81 MG chewable tablet  Chew 81 mg by mouth daily.     atorvastatin 20 MG tablet  Commonly known as:  LIPITOR  Take 20 mg by mouth daily at 6 PM.     budesonide-formoterol 160-4.5 MCG/ACT inhaler  Commonly known as:  SYMBICORT  Inhale 1 puff into the lungs every evening.     calcitRIOL 0.25 MCG capsule  Commonly known as:  ROCALTROL  Take 0.25 mcg by mouth daily.     carvedilol 25 MG tablet  Commonly known as:  COREG  Take 1 tablet (25 mg total) by mouth 2 (two) times daily with a meal.     cloNIDine 0.1 MG tablet  Commonly known as:  CATAPRES  Take 1 tablet (0.1 mg total) by mouth 3 (three) times daily.     cyclobenzaprine 10 MG tablet  Commonly known as:  FLEXERIL  Take 1 tablet (10 mg total) by mouth 2 (two) times daily as needed for muscle spasms.     diazepam 5 MG tablet  Commonly known as:  VALIUM  Take 1 tablet (5 mg total) by mouth every 8 (eight) hours as needed for anxiety (spasms).     insulin aspart 100 UNIT/ML injection  Commonly known as:  novoLOG  Inject 0-8 Units into  the skin 3 (three) times daily before meals. Sliding scale     insulin detemir 100 UNIT/ML injection  Commonly known as:  LEVEMIR  Inject 10 Units into the skin at bedtime.     isosorbide-hydrALAZINE 20-37.5 MG per tablet  Commonly known as:  BIDIL  Take 2 tablets by mouth 3 (three) times daily.     ondansetron 4 MG disintegrating tablet  Commonly known as:  ZOFRAN ODT  4mg  ODT q4 hours prn nausea/vomit     oxyCODONE-acetaminophen 5-325 MG per tablet  Commonly known as:  PERCOCET  Take 1-2 tablets by mouth every 6 (six) hours as needed for moderate pain.     PEPCID 20 MG tablet  Generic drug:  famotidine  Take 20 mg by mouth daily.     potassium chloride SA 20 MEQ tablet  Commonly known as:  K-DUR,KLOR-CON  Take 20 mEq by mouth 2 (two) times daily.     torsemide 100 MG tablet  Commonly known as:  DEMADEX  Take 1 tablet (100 mg total) by mouth daily.     Vitamin D (Ergocalciferol) 50000 UNITS Caps capsule  Commonly known as:  DRISDOL  Take 50,000 Units by mouth every 7 (seven) days. On [facility] Procedures: US Abdomen Complete  09/02/2014   CLINICAL DATA:  Vomiting  EXAM: ULTRASOUND ABDOMEN COMPLETE  COMPARISON:  None.  FINDINGS: Gallbladder: 5 mm gallstone. No gallbladder wall thickening or pericholecystic fluid. Negative sonographic Murphy's sign.  Common bile duct: Diameter: 4 mm  Liver: No focal lesion identified. Within normal limits in parenchymal echogenicity.  IVC: No abnormality visualized.  Pancreas: Poorly visualized.  Spleen: Size and appearance within normal limits.  Right Kidney: Length: 10.7 cm.  No mass or hydronephrosis.  Left Kidney: Length: 9.9 cm.  No mass or hydronephrosis.  Abdominal aorta: Atherosclerosis.  No aneurysm visualized.  Other findings: None.  IMPRESSION: Cholelithiasis, without associated sonographic findings to suggest acute cholecystitis.   Electronically Signed   By: Julian Hy M.D.   On: 09/02/2014 16:49   Dg Chest  Port 1 View  09/02/2014   CLINICAL DATA:  CHF, weakness, history of hypertension, asthma, diabetes, former smoker  EXAM: PORTABLE CHEST - 1 VIEW  COMPARISON:  08/31/2014; 07/30/2014; 05/15/2014  FINDINGS: Grossly unchanged enlarged cardiac silhouette. Overall improved aeration of the lungs. Mild pulmonary venous congestion without frank evidence of edema. No pleural effusion or pneumothorax. No focal airspace opacities. Unchanged bones.  IMPRESSION: 1. Improved aeration along suggests resolving edema and atelectasis. 2. Cardiomegaly and pulmonary venous congestion without acute cardiopulmonary disease.   Electronically Signed   By: Sandi Mariscal M.D.   On: 09/02/2014 08:41   Dg Chest Portable 1 View  08/31/2014   CLINICAL DATA:  Chest pain and shortness of breath for 2 hr.  EXAM: PORTABLE CHEST - 1 VIEW  COMPARISON:  07/31/2014  FINDINGS: The heart is mildly enlarged but stable. Slightly prominent mediastinal and hilar contours are unchanged. There is tortuosity and calcification of the thoracic aorta. There is central vascular congestion and moderate pulmonary edema. No definite pleural effusions. The bony thorax is intact.  IMPRESSION: Congestive heart failure.   Electronically Signed   By: Kalman Jewels M.D.   On: 08/31/2014 20:36   Dg Abd Portable 1v  09/02/2014   CLINICAL DATA:  Vomiting  EXAM: PORTABLE ABDOMEN - 1 VIEW  COMPARISON:  None.  FINDINGS: Nonobstructive bowel gas pattern.  Moderate colonic stool burden.  Degenerative changes of the lumbar spine.  Vascular calcifications.  IMPRESSION: No evidence of bowel obstruction.  Moderate colonic stool burden.   Electronically Signed   By: Julian Hy M.D.   On: 09/02/2014 15:47    History of Present Illness:  Presented with  This afternoon she started to have worsening shortness of breath. No chest pain but chest tightness. She states she has been taking all her medications. She have not had any leg swelling, no dietery discrepancies. She is  unsure of her dose of Lasix. Patient presented to emergency department was found to have blood pressure 200s over 90s. She was acutely short of breath. Chest x-ray taken suggestive of congestive heart failure. Last echo was done in June 2015 showing EF of 50-55% and grade 3 diastolic dysfunction. She is on Lasix 40 twice a day but it's unclear if has been increased to 60 twice a day she is also supposed and metolazone was needed but hasn't been taking. She endorses some nausea vomiting. Chest tightness which is typical for CHF exacerbations. IN ER she was started on nitro drip. Her blood pressure has improved down to 160s/90.  Hospital Course:  HTN emergency/flash pulmonary edema - Initially treated w/ nitro ggt. After this was stopped her SBP ranged from 130-220 w/ prn hydralazine IV used to try and keep BP out of emergent ranges. It was very difficulty to control. Her ARB was stopped 2/2 progressive end stage CKD. Her lasix was changed to torsemide 100mg  daily. Clonidine was added at 0.1mg  TID. The remainder of her home meds remained the same. Slowly her BP did improve on this regimen until she was persistently better than [id number] systolic. Her edema resolved and she was net  2.2L negative w/ torsemide 100mg  daily PO + zaroxolyn daily. Her weight is in line w/ her dry weight at 63kg. She will be sent home today given stability of her HTN and her stability on RA w/o recurrent edema. She will f/u w/ nephro and my office w/in the week.   Acute on chronic Diastolic CHF - responded well to torsemide 100mg  PO daily + daily zaroxolyn for 3 days . She will have the zaroxolyn held on discharge to prevent CKD progression. Weight at discharge was 140lbs.   CKD, stage V - Cr progressed from 3.0 on admission to 4.3 on discharge, however this is likely 2/2 aggressive diuresis and patient is approaching dialysis stage. So there was nothing further to do in house, nephrology will have to trend until reaches iHD stage. I  spoke w/ on call attending today (Dr Joelyn Oms) and he will arrange BMet draw on Friday. RECHECK BMET AT OFFICE ON TOC FOLLOW UP     Day of Discharge Exam BP 153/69 mmHg  Pulse 70  Temp(Src) 97 F (36.1 C) (Oral)  Resp 18  Ht 5\' 5"  (1.651 m)  Wt 140 lb 11.2 oz (63.821 kg)  BMI 23.41 kg/m2  SpO2 93%  General appearance: alert and O. Not in distress.  Eyes: no scleral icterus Throat: oropharynx moist without erythema Resp: Mild Rales Cardio: RRR, gallop, 1/6 m  GI: soft, non-tender;  Extremities: no clubbing, cyanosis or edema L Arm Fistula +Thrill and Bruit  Discharge Labs: Results for DEEANN, KATONA (MRN [id number]) as of 09/05/2014 13:03  Ref. Range 07/31/2014 01:17 08/01/2014 03:16 08/31/2014 19:40 09/01/2014 01:24 09/01/2014 06:32 09/03/2014 04:00 09/04/2014 02:59 09/05/2014 05:10  Sodium Latest Range: 135-145 mmol/L 134 (L) 136 136  136 137 136 135  Potassium Latest Range: 3.5-5.1 mmol/L 3.2 (L) 3.7 3.5  3.3 (L) 3.8 4.2 4.1  Chloride Latest Range: 96-112 mmol/L 98 98 99  99 99 97 96  CO2 Latest Range: 19-32 mmol/L 29 29 27  28 29 25 26   Mean Plasma Glucose Latest Range: <117 mg/dL 217 (H)         BUN Latest Range: 6-23 mg/dL 35 (H) 39 (H) 62 (H)  60 (H) 61 (H) 67 (H) 66 (H)  Creatinine Latest Range: 0.50-1.10 mg/dL 3.02 (H) 3.63 (H) 3.26 (H) 3.30 (H) 3.31 (H) 4.09 (H) 4.14 (H) 4.31 (H)  Calcium Latest Range: 8.4-10.5 mg/dL 8.5 8.5 9.4  8.8 9.2 9.2 9.2  GFR calc non Af Amer Latest Range: >90 mL/min 15 (L) 12 (L) 14 (L) 14 (L) 14 (L) 11 (L) 11 (L) 10 (L)  GFR calc Af Amer Latest Range: >90 mL/min 18 (L) 14 (L) 16 (L) 16 (L) 16 (L) 12 (L) 12 (L) 12 (L)  Glucose Latest Range: 70-99 mg/dL 212 (H) 196 (H) 238 (H)  155 (H) 112 (H) 126 (H) 52 (L)  Anion gap Latest Range: 5-15  7 9 10  9 9 14  13  Phosphorus Latest Range: 2.3-4.6 mg/dL 4.1     4.6    Magnesium Latest Range: 1.5-2.5 mg/dL 1.6         Alkaline Phosphatase Latest Range: 39-117 U/L 96 85   90 80 75 79  Albumin Latest Range:  3.5-5.2 g/dL 2.9 (L) 2.7 (L)   3.0 (L) 3.0 (L) 3.0 (L) 3.1 (L)  AST Latest Range: 0-37 U/L 19 18   20 19 18 18   ALT Latest Range: 0-35 U/L 15 12   16 11 12 13   Total Protein Latest Range: 6.0-8.3 g/dL 6.3 5.8 (L)   6.7 6.4 6.5 6.5  Total Bilirubin Latest Range: 0.3-1.2 mg/dL 0.4 0.5   0.5 0.6 0.3 0.5    Recent Labs  09/04/14 0259 09/05/14 0510  AST 18 18  ALT 12 13  ALKPHOS 75 79  BILITOT 0.3 0.5  PROT 6.5 6.5  ALBUMIN 3.0* 3.1*    Recent Labs  09/04/14 0259 09/05/14 0510  WBC 8.7 7.8  HGB 9.2* 9.3*  HCT 28.6* 29.0*  MCV 88.3 87.3  PLT 276 313   Lab Results  Component Value Date   INR 0.96 01/09/2014   INR 0.91 07/20/2013   INR 0.95 05/23/2013   No results for input(s): CKTOTAL, CKMB, CKMBINDEX, TROPONINI in the last 72 hours. No results for input(s): TSH, T4TOTAL, T3FREE, THYROIDAB in the last 72 hours.  Invalid input(s): FREET3 No results for input(s): VITAMINB12, FOLATE, FERRITIN, TIBC, IRON, RETICCTPCT in the last 72 hours.  Discharge instructions:     Discharge Instructions    (HEART FAILURE PATIENTS) Call MD:  Anytime you have any of the following symptoms: 1) 3 pound weight gain in 24 hours or 5 pounds in 1 week 2) shortness of breath, with or without a dry hacking cough 3) swelling in the hands, feet or stomach 4) if you have to sleep on extra pillows at night in order to breathe.    Complete by:  As directed      Call MD for:  difficulty breathing, headache or visual disturbances    Complete by:  As directed      Diet - low sodium heart healthy    Complete by:  As directed      Increase activity slowly    Complete by:  As directed           01-Home or Self Care   Disposition:  Home   Follow-up Appts: Follow-up with Dr. Ardeth Perfect at [facility] in 1 week. We will  Call for appointment.  Condition on Discharge: stable   Tests Needing Follow-up: order BMet at follow up   Time spent in discharge (includes decision making &  examination of pt): 45 minutes    Signed: Morning Halberg 09/05/2014, 10:47 AM

## 2014-09-06 ENCOUNTER — Encounter (HOSPITAL_COMMUNITY): Payer: BC Managed Care – PPO

## 2014-09-25 ENCOUNTER — Other Ambulatory Visit (HOSPITAL_COMMUNITY): Payer: Self-pay | Admitting: *Deleted

## 2014-09-26 ENCOUNTER — Encounter (HOSPITAL_COMMUNITY)
Admission: RE | Admit: 2014-09-26 | Discharge: 2014-09-26 | Disposition: A | Discharge status: Home / Self Care | Payer: BC Managed Care – PPO | Source: Ambulatory Visit | Attending: Nephrology | Admitting: Nephrology

## 2014-09-26 DIAGNOSIS — D631 Anemia in chronic kidney disease: Secondary | ICD-10-CM | POA: Insufficient documentation

## 2014-09-26 DIAGNOSIS — Z79899 Other long term (current) drug therapy: Secondary | ICD-10-CM | POA: Insufficient documentation

## 2014-09-26 DIAGNOSIS — Z5181 Encounter for therapeutic drug level monitoring: Secondary | ICD-10-CM | POA: Diagnosis not present

## 2014-09-26 DIAGNOSIS — N184 Chronic kidney disease, stage 4 (severe): Secondary | ICD-10-CM | POA: Diagnosis not present

## 2014-09-26 LAB — POCT HEMOGLOBIN-HEMACUE: Hemoglobin: 12.1 g/dL (ref 12.0–15.0)

## 2014-09-26 LAB — IRON AND TIBC
Iron: 38 ug/dL — ABNORMAL LOW (ref 42–145)
Saturation Ratios: 37 % (ref 20–55)
TIBC: 102 ug/dL — ABNORMAL LOW (ref 250–470)
UIBC: 64 ug/dL — ABNORMAL LOW (ref 125–400)

## 2014-09-26 LAB — FERRITIN: Ferritin: 209 ng/mL (ref 10–291)

## 2014-09-26 MED ORDER — SODIUM CHLORIDE 0.9 % IV SOLN
510.0000 mg | INTRAVENOUS | Status: AC
  Administered 2014-09-26: 510 mg via INTRAVENOUS
  Filled 2014-09-26: qty 17

## 2014-09-26 MED ORDER — DARBEPOETIN ALFA 100 MCG/0.5ML IJ SOSY
PREFILLED_SYRINGE | INTRAMUSCULAR | Status: AC
  Filled 2014-09-26: qty 0.5

## 2014-09-26 MED ORDER — DARBEPOETIN ALFA 100 MCG/0.5ML IJ SOSY: 100.0000 ug | PREFILLED_SYRINGE | INTRAMUSCULAR | Status: DC

## 2014-10-09 ENCOUNTER — Encounter: Payer: Self-pay | Admitting: Vascular Surgery

## 2014-10-10 ENCOUNTER — Ambulatory Visit (INDEPENDENT_AMBULATORY_CARE_PROVIDER_SITE_OTHER): Payer: Self-pay | Admitting: Vascular Surgery

## 2014-10-10 ENCOUNTER — Encounter: Payer: Self-pay | Admitting: Vascular Surgery

## 2014-10-10 ENCOUNTER — Other Ambulatory Visit: Payer: Self-pay

## 2014-10-10 ENCOUNTER — Ambulatory Visit (HOSPITAL_COMMUNITY)
Admission: RE | Admit: 2014-10-10 | Discharge: 2014-10-10 | Disposition: A | Discharge status: Home / Self Care | Payer: BC Managed Care – PPO | Source: Ambulatory Visit | Attending: Vascular Surgery | Admitting: Vascular Surgery

## 2014-10-10 ENCOUNTER — Encounter (HOSPITAL_COMMUNITY)
Admission: RE | Admit: 2014-10-10 | Discharge: 2014-10-10 | Disposition: A | Discharge status: Home / Self Care | Payer: BC Managed Care – PPO | Source: Ambulatory Visit | Attending: Nephrology | Admitting: Nephrology

## 2014-10-10 VITALS — BP 195/78 | HR 83 | Ht 65.0 in | Wt 140.0 lb

## 2014-10-10 DIAGNOSIS — N184 Chronic kidney disease, stage 4 (severe): Secondary | ICD-10-CM

## 2014-10-10 DIAGNOSIS — N186 End stage renal disease: Secondary | ICD-10-CM

## 2014-10-10 DIAGNOSIS — Z4931 Encounter for adequacy testing for hemodialysis: Secondary | ICD-10-CM | POA: Diagnosis not present

## 2014-10-10 LAB — POCT HEMOGLOBIN-HEMACUE: Hemoglobin: 11.6 g/dL — ABNORMAL LOW (ref 12.0–15.0)

## 2014-10-10 MED ORDER — DARBEPOETIN ALFA 100 MCG/0.5ML IJ SOSY
100.0000 ug | PREFILLED_SYRINGE | INTRAMUSCULAR | Status: DC
  Administered 2014-10-10: 100 ug via SUBCUTANEOUS

## 2014-10-10 MED ORDER — DARBEPOETIN ALFA 100 MCG/0.5ML IJ SOSY
PREFILLED_SYRINGE | INTRAMUSCULAR | Status: AC
  Filled 2014-10-10: qty 0.5

## 2014-10-10 NOTE — Progress Notes (Signed)
   Patient name: Destiny Day MRN: [id number] DOB: Apr 22, 1951 Sex: female  REASON FOR VISIT: 6 week follow up visit.  HPI: Destiny Day is a 64 y.o. female who had a left basilic vein transposition placed on 07/10/2014. She was last seen in our office on 08/29/2014. At that time, the diameters of the fistula ranged from 0.48-0.79 cm. She comes in for a follow up visit. He has no specific complaints referrable to her left upper extremity. She is not on dialysis.  REVIEW OF SYSTEMS: Valu.Nieves ] denotes positive finding; [  ] denotes negative finding  CARDIOVASCULAR:  [ ]  chest pain   [ ]  dyspnea on exertion    CONSTITUTIONAL:  [ ]  fever   [ ]  chills  PHYSICAL EXAM: Filed Vitals:   10/10/14 1411  BP: 195/78  Pulse: 83  Height: 5\' 5"  (Q000111Q m)  Weight: 140 lb (63.504 kg)  SpO2: 100%   Body mass index is 23.3 kg/(m^2). GENERAL: The patient is a well-nourished female, in no acute distress. The vital signs are documented above. CARDIOVASCULAR: There is a regular rate and rhythm. PULMONARY: There is good air exchange bilaterally without wheezing or rales. Her fistula does not appear to be enlarging significantly in size. It is slightly pulsatile.  I have independently interpreted her duplex of her fistula which shows that the diameters of the fistula range from 0.32 cm-0.50 cm. There are several areas of stenosis within the fistula noted.  MEDICAL ISSUES: I have independently interpreted her duplex of her fistula which shows that the diameters of the fistula range from 0.32 cm-0.50 cm. There are several areas of stenosis within the fistula noted. For this reason, I have recommended that we proceed with a fistulogram in order to determine if there is any thing we can do to assist with maturation of the fistula. Otherwise she would require consideration for new access. Says been scheduled for 4/416. I'll make further recommendations pending these results.  [place] [facility] [place] Beeper: [phone]

## 2014-10-29 ENCOUNTER — Encounter (HOSPITAL_COMMUNITY): Payer: Self-pay | Admitting: Vascular Surgery

## 2014-10-29 ENCOUNTER — Encounter (HOSPITAL_COMMUNITY)
Admission: RE | Disposition: A | Discharge status: Home / Self Care | Payer: Self-pay | Source: Ambulatory Visit | Attending: Vascular Surgery

## 2014-10-29 ENCOUNTER — Ambulatory Visit (HOSPITAL_COMMUNITY)
Admission: RE | Admit: 2014-10-29 | Discharge: 2014-10-29 | Disposition: A | Discharge status: Home / Self Care | Payer: BC Managed Care – PPO | Source: Ambulatory Visit | Attending: Vascular Surgery | Admitting: Vascular Surgery

## 2014-10-29 DIAGNOSIS — T82898A Other specified complication of vascular prosthetic devices, implants and grafts, initial encounter: Secondary | ICD-10-CM | POA: Diagnosis not present

## 2014-10-29 DIAGNOSIS — N186 End stage renal disease: Secondary | ICD-10-CM | POA: Diagnosis not present

## 2014-10-29 DIAGNOSIS — N184 Chronic kidney disease, stage 4 (severe): Secondary | ICD-10-CM | POA: Diagnosis present

## 2014-10-29 DIAGNOSIS — N185 Chronic kidney disease, stage 5: Secondary | ICD-10-CM | POA: Diagnosis not present

## 2014-10-29 HISTORY — PX: FISTULOGRAM: SHX5832

## 2014-10-29 LAB — POCT I-STAT, CHEM 8
BUN: 55 mg/dL — ABNORMAL HIGH (ref 6–23)
Calcium, Ion: 1.07 mmol/L — ABNORMAL LOW (ref 1.13–1.30)
Chloride: 94 mmol/L — ABNORMAL LOW (ref 96–112)
Creatinine, Ser: 3.2 mg/dL — ABNORMAL HIGH (ref 0.50–1.10)
Glucose, Bld: 182 mg/dL — ABNORMAL HIGH (ref 70–99)
HCT: 42 % (ref 36.0–46.0)
Hemoglobin: 14.3 g/dL (ref 12.0–15.0)
Potassium: 3 mmol/L — ABNORMAL LOW (ref 3.5–5.1)
Sodium: 134 mmol/L — ABNORMAL LOW (ref 135–145)
TCO2: 27 mmol/L (ref 0–100)

## 2014-10-29 SURGERY — FISTULOGRAM
Anesthesia: LOCAL | Laterality: Left

## 2014-10-29 MED ORDER — MIDAZOLAM HCL 2 MG/2ML IJ SOLN
INTRAMUSCULAR | Status: AC
Start: 2014-10-29 — End: 2014-10-29
  Filled 2014-10-29: qty 2

## 2014-10-29 MED ORDER — FENTANYL CITRATE 0.05 MG/ML IJ SOLN
INTRAMUSCULAR | Status: AC
  Filled 2014-10-29: qty 2

## 2014-10-29 MED ORDER — LIDOCAINE HCL (PF) 1 % IJ SOLN
INTRAMUSCULAR | Status: AC
  Filled 2014-10-29: qty 30

## 2014-10-29 MED ORDER — HEPARIN (PORCINE) IN NACL 2-0.9 UNIT/ML-% IJ SOLN
INTRAMUSCULAR | Status: AC
  Filled 2014-10-29: qty 500

## 2014-10-29 NOTE — Discharge Instructions (Signed)
Fistulogram, Care After °Refer to this sheet in the next few weeks. These instructions provide you with information on caring for yourself after your procedure. Your health care provider may also give you more specific instructions. Your treatment has been planned according to current medical practices, but problems sometimes occur. Call your health care provider if you have any problems or questions after your procedure. °WHAT TO EXPECT AFTER THE PROCEDURE °After your procedure, it is typical to have the following: °· A small amount of discomfort in the area where the catheters were placed. °· A small amount of bruising around the fistula. °· Sleepiness and fatigue. °HOME CARE INSTRUCTIONS °· Rest at home for the day following your procedure. °· Do not drive or operate heavy machinery while taking pain medicine. °· Take medicines only as directed by your health care provider. °· Do not take baths, swim, or use a hot tub until your health care provider approves. You may shower 24 hours after the procedure or as directed by your health care provider. °· There are many different ways to close and cover an incision, including stitches, skin glue, and adhesive strips. Follow your health care provider's instructions on: °¨ Incision care. °¨ Bandage (dressing) changes and removal. °¨ Incision closure removal. °· Monitor your dialysis fistula carefully. °SEEK MEDICAL CARE IF: °· You have drainage, redness, swelling, or pain at your catheter site. °· You have a fever. °· You have chills. °SEEK IMMEDIATE MEDICAL CARE IF: °· You feel weak. °· You have trouble balancing. °· You have trouble moving your arms or legs. °· You have problems with your speech or vision. °· You can no longer feel a vibration or buzz when you put your fingers over your dialysis fistula. °· The limb that was used for the procedure: °¨ Swells. °¨ Is painful. °¨ Is cold. °¨ Is discolored, such as blue or pale white. °Document Released: 11/27/2013  Document Reviewed: 09/01/2013 °ExitCare® Patient Information ©2015 [employer]. This information is not intended to replace advice given to you by your health care provider. Make sure you discuss any questions you have with your health care provider. ° °

## 2014-10-29 NOTE — Op Note (Signed)
   PATIENT: Destiny Day  MRN: [id number] DOB: August 03, 1950    DATE OF PROCEDURE: 10/29/2014  INDICATIONS: Destiny Day is a 64 y.o. female who is not yet on dialysis. She had a basilic vein transposition in December which has not matured adequately. She presents for a fistulogram.  PROCEDURE:  1. Ultrasound-guided access to the left basilic vein transposition 2. Fistulogram left basilic vein transposition  SURGEON: Judeth Cornfield. Scot Dock, MD, FACS  ANESTHESIA: local with sedation   EBL: minimal  TECHNIQUE: The patient was brought to the peripheral vascular lab and received 1 mg of Versed and 50 g of fentanyl. The left upper extremity was prepped and draped in usual sterile fashion.Under ultrasound guidance, after the skin was anesthetized, the proximal fistula was cannulated with a micropuncture needle and a micropuncture sheath introduced over the wire. A fistulogram was then obtained to evaluate the fistula from the cannulation site to the central veins.  Next the fistula was compressed to allow reflux to the proximal fistula and arterial anastomosis. At the completion of the procedure a 4-0 Monocryl was placed for hemostasis. No immediate complications were noted.  FINDINGS:  1. There is no central venous stenosis. 2. The proximal half of the fistula from the anastomosis to the mid upper arm is narrowed with diffuse scarring. There is no focal stenosis. Beyond this the fistula is widely patent.  CLINICAL NOTE: The fistula was not amenable to venoplasty. She will need to be evaluated for new access. I have reviewed her previous vein map the veins in the right arm do not appear any better than on the left. Therefore she will likely require placement of an AV graft. As she is not yet on dialysis I would favor waiting to place a graft until she is closer to needing dialysis.  [name], MD, FACS [facility]  DATE OF DICTATION:    10/29/2014

## 2014-10-29 NOTE — Interval H&P Note (Signed)
History and Physical Interval Note:  10/29/2014 9:01 AM  Destiny Day  has presented today for surgery, with the diagnosis of end stage renal  The various methods of treatment have been discussed with the patient and family. After consideration of risks, benefits and other options for treatment, the patient has consented to  Procedure(s): FISTULOGRAM (Left) as a surgical intervention .  The patient's history has been reviewed, patient examined, no change in status, stable for surgery.  I have reviewed the patient's chart and labs.  Questions were answered to the patient's satisfaction.     Xavior Niazi S

## 2014-10-29 NOTE — H&P (View-Only) (Signed)
   Patient name: Destiny Day MRN: [id number] DOB: 1951-05-14 Sex: female  REASON FOR VISIT: 6 week follow up visit.  HPI: Destiny Day is a 64 y.o. female who had a left basilic vein transposition placed on 07/10/2014. She was last seen in our office on 08/29/2014. At that time, the diameters of the fistula ranged from 0.48-0.79 cm. She comes in for a follow up visit. He has no specific complaints referrable to her left upper extremity. She is not on dialysis.  REVIEW OF SYSTEMS: Valu.Nieves ] denotes positive finding; [  ] denotes negative finding  CARDIOVASCULAR:  [ ]  chest pain   [ ]  dyspnea on exertion    CONSTITUTIONAL:  [ ]  fever   [ ]  chills  PHYSICAL EXAM: Filed Vitals:   10/10/14 1411  BP: 195/78  Pulse: 83  Height: 5\' 5"  (1.651 m)  Weight: 140 lb (63.504 kg)  SpO2: 100%   Body mass index is 23.3 kg/(m^2). GENERAL: The patient is a well-nourished female, in no acute distress. The vital signs are documented above. CARDIOVASCULAR: There is a regular rate and rhythm. PULMONARY: There is good air exchange bilaterally without wheezing or rales. Her fistula does not appear to be enlarging significantly in size. It is slightly pulsatile.  I have independently interpreted her duplex of her fistula which shows that the diameters of the fistula range from 0.32 cm-0.50 cm. There are several areas of stenosis within the fistula noted.  MEDICAL ISSUES: I have independently interpreted her duplex of her fistula which shows that the diameters of the fistula range from 0.32 cm-0.50 cm. There are several areas of stenosis within the fistula noted. For this reason, I have recommended that we proceed with a fistulogram in order to determine if there is any thing we can do to assist with maturation of the fistula. Otherwise she would require consideration for new access. Says been scheduled for 4/416. I'll make further recommendations pending these results.  [place] [facility] [place] Beeper: [phone]

## 2014-10-29 NOTE — Progress Notes (Signed)
Notified  Dr Scot Dock of K 3.0, no new orders

## 2014-10-30 ENCOUNTER — Other Ambulatory Visit: Payer: Self-pay

## 2014-11-07 ENCOUNTER — Encounter (HOSPITAL_COMMUNITY): Payer: Self-pay | Admitting: *Deleted

## 2014-11-07 ENCOUNTER — Encounter (HOSPITAL_COMMUNITY)
Admission: RE | Admit: 2014-11-07 | Discharge: 2014-11-07 | Disposition: A | Discharge status: Home / Self Care | Payer: BC Managed Care – PPO | Source: Ambulatory Visit | Attending: Nephrology | Admitting: Nephrology

## 2014-11-07 DIAGNOSIS — Z5181 Encounter for therapeutic drug level monitoring: Secondary | ICD-10-CM | POA: Diagnosis not present

## 2014-11-07 DIAGNOSIS — D631 Anemia in chronic kidney disease: Secondary | ICD-10-CM | POA: Diagnosis not present

## 2014-11-07 DIAGNOSIS — Z79899 Other long term (current) drug therapy: Secondary | ICD-10-CM | POA: Insufficient documentation

## 2014-11-07 DIAGNOSIS — N184 Chronic kidney disease, stage 4 (severe): Secondary | ICD-10-CM

## 2014-11-07 LAB — IRON AND TIBC
Iron: 66 ug/dL (ref 42–145)
Saturation Ratios: 28 % (ref 20–55)
TIBC: 240 ug/dL — ABNORMAL LOW (ref 250–470)
UIBC: 174 ug/dL (ref 125–400)

## 2014-11-07 LAB — FERRITIN: Ferritin: 283 ng/mL (ref 10–291)

## 2014-11-07 LAB — POCT HEMOGLOBIN-HEMACUE: Hemoglobin: 12.8 g/dL (ref 12.0–15.0)

## 2014-11-07 MED ORDER — SODIUM CHLORIDE 0.9 % IV SOLN
INTRAVENOUS | Status: DC
  Administered 2014-11-08 (×2): via INTRAVENOUS

## 2014-11-07 MED ORDER — CHLORHEXIDINE GLUCONATE CLOTH 2 % EX PADS: 6.0000 | MEDICATED_PAD | Freq: Once | CUTANEOUS | Status: DC

## 2014-11-07 MED ORDER — DEXTROSE 5 % IV SOLN
1.5000 g | INTRAVENOUS | Status: AC
  Administered 2014-11-08: 1.5 g via INTRAVENOUS
  Filled 2014-11-07: qty 1.5

## 2014-11-07 MED ORDER — DARBEPOETIN ALFA 100 MCG/0.5ML IJ SOSY: 100.0000 ug | PREFILLED_SYRINGE | INTRAMUSCULAR | Status: DC

## 2014-11-07 NOTE — Progress Notes (Signed)
Pt denies SOB and chest pain. Pt is under the care of Dr. Einar Gip, cardiology. Pt made aware to not take any diabetic medications the mornng of surgery. Pt made aware to stop otc vitamins, herbal medications and NSAIDs. Pt verbalized understanding of all  Pre-op  instructions.

## 2014-11-08 ENCOUNTER — Ambulatory Visit (HOSPITAL_COMMUNITY): Payer: BC Managed Care – PPO | Admitting: Anesthesiology

## 2014-11-08 ENCOUNTER — Encounter (HOSPITAL_COMMUNITY)
Admission: RE | Disposition: A | Discharge status: Home / Self Care | Payer: Self-pay | Source: Ambulatory Visit | Attending: Vascular Surgery

## 2014-11-08 ENCOUNTER — Other Ambulatory Visit: Payer: Self-pay | Admitting: *Deleted

## 2014-11-08 ENCOUNTER — Ambulatory Visit (HOSPITAL_COMMUNITY)
Admission: RE | Admit: 2014-11-08 | Discharge: 2014-11-08 | Disposition: A | Discharge status: Home / Self Care | Payer: BC Managed Care – PPO | Source: Ambulatory Visit | Attending: Vascular Surgery | Admitting: Vascular Surgery

## 2014-11-08 ENCOUNTER — Encounter (HOSPITAL_COMMUNITY): Payer: Self-pay | Admitting: Critical Care Medicine

## 2014-11-08 DIAGNOSIS — N186 End stage renal disease: Secondary | ICD-10-CM | POA: Insufficient documentation

## 2014-11-08 DIAGNOSIS — K219 Gastro-esophageal reflux disease without esophagitis: Secondary | ICD-10-CM | POA: Diagnosis not present

## 2014-11-08 DIAGNOSIS — Z79899 Other long term (current) drug therapy: Secondary | ICD-10-CM | POA: Diagnosis not present

## 2014-11-08 DIAGNOSIS — I509 Heart failure, unspecified: Secondary | ICD-10-CM | POA: Diagnosis not present

## 2014-11-08 DIAGNOSIS — Z87891 Personal history of nicotine dependence: Secondary | ICD-10-CM | POA: Insufficient documentation

## 2014-11-08 DIAGNOSIS — G4730 Sleep apnea, unspecified: Secondary | ICD-10-CM | POA: Diagnosis not present

## 2014-11-08 DIAGNOSIS — E119 Type 2 diabetes mellitus without complications: Secondary | ICD-10-CM | POA: Diagnosis not present

## 2014-11-08 DIAGNOSIS — M1990 Unspecified osteoarthritis, unspecified site: Secondary | ICD-10-CM | POA: Insufficient documentation

## 2014-11-08 DIAGNOSIS — J45909 Unspecified asthma, uncomplicated: Secondary | ICD-10-CM | POA: Insufficient documentation

## 2014-11-08 DIAGNOSIS — I120 Hypertensive chronic kidney disease with stage 5 chronic kidney disease or end stage renal disease: Secondary | ICD-10-CM | POA: Insufficient documentation

## 2014-11-08 DIAGNOSIS — Z4931 Encounter for adequacy testing for hemodialysis: Secondary | ICD-10-CM

## 2014-11-08 DIAGNOSIS — N184 Chronic kidney disease, stage 4 (severe): Secondary | ICD-10-CM | POA: Diagnosis not present

## 2014-11-08 HISTORY — PX: BASCILIC VEIN TRANSPOSITION: SHX5742

## 2014-11-08 HISTORY — DX: Adverse effect of unspecified anesthetic, initial encounter: T41.45XA

## 2014-11-08 HISTORY — DX: Anemia, unspecified: D64.9

## 2014-11-08 HISTORY — DX: Other complications of anesthesia, initial encounter: T88.59XA

## 2014-11-08 LAB — GLUCOSE, CAPILLARY: Glucose-Capillary: 163 mg/dL — ABNORMAL HIGH (ref 70–99)

## 2014-11-08 LAB — POCT I-STAT 4, (NA,K, GLUC, HGB,HCT)
Glucose, Bld: 185 mg/dL — ABNORMAL HIGH (ref 70–99)
HCT: 42 % (ref 36.0–46.0)
Hemoglobin: 14.3 g/dL (ref 12.0–15.0)
Potassium: 3.1 mmol/L — ABNORMAL LOW (ref 3.5–5.1)
Sodium: 135 mmol/L (ref 135–145)

## 2014-11-08 SURGERY — TRANSPOSITION, VEIN, BASILIC
Anesthesia: General | Site: Arm Lower | Laterality: Right

## 2014-11-08 MED ORDER — HEPARIN SODIUM (PORCINE) 1000 UNIT/ML IJ SOLN
INTRAMUSCULAR | Status: DC | PRN
  Administered 2014-11-08: 4000 [IU] via INTRAVENOUS

## 2014-11-08 MED ORDER — FENTANYL CITRATE 0.05 MG/ML IJ SOLN
INTRAMUSCULAR | Status: AC
  Filled 2014-11-08: qty 5

## 2014-11-08 MED ORDER — SODIUM CHLORIDE 0.9 % IR SOLN
Status: DC | PRN
  Administered 2014-11-08: 10:00:00

## 2014-11-08 MED ORDER — ONDANSETRON HCL 4 MG/2ML IJ SOLN
INTRAMUSCULAR | Status: DC | PRN
  Administered 2014-11-08 (×2): 4 mg via INTRAVENOUS

## 2014-11-08 MED ORDER — PAPAVERINE HCL 30 MG/ML IJ SOLN
INTRAMUSCULAR | Status: AC
  Filled 2014-11-08: qty 2

## 2014-11-08 MED ORDER — PROTAMINE SULFATE 10 MG/ML IV SOLN
INTRAVENOUS | Status: DC | PRN
  Administered 2014-11-08: 20 mg via INTRAVENOUS

## 2014-11-08 MED ORDER — MIDAZOLAM HCL 2 MG/2ML IJ SOLN
INTRAMUSCULAR | Status: AC
  Filled 2014-11-08: qty 2

## 2014-11-08 MED ORDER — ONDANSETRON HCL 4 MG/2ML IJ SOLN
INTRAMUSCULAR | Status: AC
  Filled 2014-11-08: qty 4

## 2014-11-08 MED ORDER — MEPERIDINE HCL 25 MG/ML IJ SOLN: 6.2500 mg | INTRAMUSCULAR | Status: DC | PRN

## 2014-11-08 MED ORDER — HYDROMORPHONE HCL 1 MG/ML IJ SOLN
0.2500 mg | INTRAMUSCULAR | Status: DC | PRN
  Administered 2014-11-08 (×2): 0.5 mg via INTRAVENOUS

## 2014-11-08 MED ORDER — LIDOCAINE HCL (CARDIAC) 10 MG/ML IV SOLN
INTRAVENOUS | Status: DC | PRN
  Administered 2014-11-08: 20 mg via INTRAVENOUS

## 2014-11-08 MED ORDER — OXYCODONE HCL 5 MG PO TABS: 5.0000 mg | ORAL_TABLET | Freq: Once | ORAL | Status: DC | PRN

## 2014-11-08 MED ORDER — MIDAZOLAM HCL 5 MG/5ML IJ SOLN
INTRAMUSCULAR | Status: DC | PRN
  Administered 2014-11-08: 2 mg via INTRAVENOUS

## 2014-11-08 MED ORDER — 0.9 % SODIUM CHLORIDE (POUR BTL) OPTIME
TOPICAL | Status: DC | PRN
  Administered 2014-11-08: 1000 mL

## 2014-11-08 MED ORDER — OXYCODONE HCL 5 MG/5ML PO SOLN: 5.0000 mg | Freq: Once | ORAL | Status: DC | PRN

## 2014-11-08 MED ORDER — THROMBIN 20000 UNITS EX SOLR
CUTANEOUS | Status: AC
  Filled 2014-11-08: qty 20000

## 2014-11-08 MED ORDER — HYDROMORPHONE HCL 1 MG/ML IJ SOLN
INTRAMUSCULAR | Status: AC
  Filled 2014-11-08: qty 1

## 2014-11-08 MED ORDER — PROPOFOL 10 MG/ML IV BOLUS
INTRAVENOUS | Status: DC | PRN
  Administered 2014-11-08: 150 mg via INTRAVENOUS

## 2014-11-08 MED ORDER — LIDOCAINE HCL (CARDIAC) 20 MG/ML IV SOLN
INTRAVENOUS | Status: AC
  Filled 2014-11-08: qty 5

## 2014-11-08 MED ORDER — FENTANYL CITRATE 0.05 MG/ML IJ SOLN
INTRAMUSCULAR | Status: DC | PRN
  Administered 2014-11-08 (×4): 25 ug via INTRAVENOUS

## 2014-11-08 MED ORDER — LIDOCAINE HCL (PF) 1 % IJ SOLN
INTRAMUSCULAR | Status: AC
  Filled 2014-11-08: qty 30

## 2014-11-08 MED ORDER — LIDOCAINE-EPINEPHRINE (PF) 1 %-1:200000 IJ SOLN
INTRAMUSCULAR | Status: AC
  Filled 2014-11-08: qty 10

## 2014-11-08 MED ORDER — PROMETHAZINE HCL 25 MG/ML IJ SOLN: 6.2500 mg | INTRAMUSCULAR | Status: DC | PRN

## 2014-11-08 MED ORDER — OXYCODONE HCL 5 MG PO TABS: 5.0000 mg | ORAL_TABLET | Freq: Four times a day (QID) | ORAL | Status: DC | PRN

## 2014-11-08 SURGICAL SUPPLY — 32 items
CANISTER SUCTION 2500CC (MISCELLANEOUS) ×3 IMPLANT
CANNULA VESSEL 3MM 2 BLNT TIP (CANNULA) IMPLANT
CLIP TI MEDIUM 24 (CLIP) ×3 IMPLANT
CLIP TI WIDE RED SMALL 24 (CLIP) ×3 IMPLANT
COVER PROBE W GEL 5X96 (DRAPES) ×3 IMPLANT
DECANTER SPIKE VIAL GLASS SM (MISCELLANEOUS) ×3 IMPLANT
DRAIN PENROSE 1/2X12 LTX STRL (WOUND CARE) IMPLANT
ELECT REM PT RETURN 9FT ADLT (ELECTROSURGICAL) ×3
ELECTRODE REM PT RTRN 9FT ADLT (ELECTROSURGICAL) ×1 IMPLANT
GLOVE BIO SURGEON STRL SZ 6.5 (GLOVE) ×2 IMPLANT
GLOVE BIO SURGEON STRL SZ7.5 (GLOVE) ×3 IMPLANT
GLOVE BIO SURGEONS STRL SZ 6.5 (GLOVE) ×1
GLOVE BIOGEL PI IND STRL 6.5 (GLOVE) ×1 IMPLANT
GLOVE BIOGEL PI IND STRL 8 (GLOVE) ×1 IMPLANT
GLOVE BIOGEL PI INDICATOR 6.5 (GLOVE) ×2
GLOVE BIOGEL PI INDICATOR 8 (GLOVE) ×2
GOWN STRL REUS W/ TWL LRG LVL3 (GOWN DISPOSABLE) ×3 IMPLANT
GOWN STRL REUS W/TWL LRG LVL3 (GOWN DISPOSABLE) ×6
KIT BASIN OR (CUSTOM PROCEDURE TRAY) ×3 IMPLANT
KIT ROOM TURNOVER OR (KITS) ×3 IMPLANT
LIQUID BAND (GAUZE/BANDAGES/DRESSINGS) ×3 IMPLANT
NS IRRIG 1000ML POUR BTL (IV SOLUTION) ×3 IMPLANT
PACK CV ACCESS (CUSTOM PROCEDURE TRAY) ×3 IMPLANT
PAD ARMBOARD 7.5X6 YLW CONV (MISCELLANEOUS) ×6 IMPLANT
SPONGE SURGIFOAM ABS GEL 100 (HEMOSTASIS) IMPLANT
SUT PROLENE 6 0 BV (SUTURE) ×3 IMPLANT
SUT SILK 2 0 SH (SUTURE) ×3 IMPLANT
SUT VIC AB 3-0 SH 27 (SUTURE) ×2
SUT VIC AB 3-0 SH 27X BRD (SUTURE) ×1 IMPLANT
SUT VICRYL 4-0 PS2 18IN ABS (SUTURE) ×3 IMPLANT
UNDERPAD 30X30 INCONTINENT (UNDERPADS AND DIAPERS) ×3 IMPLANT
WATER STERILE IRR 1000ML POUR (IV SOLUTION) ×3 IMPLANT

## 2014-11-08 NOTE — Anesthesia Procedure Notes (Signed)
Procedure Name: MAC Date/Time: 11/08/2014 10:45 AM Performed by: Merrilyn Puma B Pre-anesthesia Checklist: Patient identified, Timeout performed, Emergency Drugs available, Suction available and Patient being monitored Patient Re-evaluated:Patient Re-evaluated prior to inductionOxygen Delivery Method: Circle system utilized Preoxygenation: Pre-oxygenation with 100% oxygen Intubation Type: IV induction LMA: LMA inserted LMA Size: 4.0 Number of attempts: 1 Placement Confirmation: positive ETCO2 and breath sounds checked- equal and bilateral Tube secured with: Tape Dental Injury: Teeth and Oropharynx as per pre-operative assessment

## 2014-11-08 NOTE — Discharge Instructions (Signed)
° ° °  11/08/2014 Destiny Day [id number] Feb 15, 1951  Surgeon(s): Angelia Mould, MD  Procedure(s): FIRST STAGE BASILIC VEIN TRANSPOSITION-right  x Do not stick fistula for 12 weeks

## 2014-11-08 NOTE — H&P (View-Only) (Signed)
   Patient name: Destiny Day MRN: [id number] DOB: 02-03-1951 Sex: female  REASON FOR VISIT: 6 week follow up visit.  HPI: Destiny Day is a 64 y.o. female who had a left basilic vein transposition placed on 07/10/2014. She was last seen in our office on 08/29/2014. At that time, the diameters of the fistula ranged from 0.48-0.79 cm. She comes in for a follow up visit. He has no specific complaints referrable to her left upper extremity. She is not on dialysis.  REVIEW OF SYSTEMS: Valu.Nieves ] denotes positive finding; [  ] denotes negative finding  CARDIOVASCULAR:  [ ]  chest pain   [ ]  dyspnea on exertion    CONSTITUTIONAL:  [ ]  fever   [ ]  chills  PHYSICAL EXAM: Filed Vitals:   10/10/14 1411  BP: 195/78  Pulse: 83  Height: 5\' 5"  (1.651 m)  Weight: 140 lb (63.504 kg)  SpO2: 100%   Body mass index is 23.3 kg/(m^2). GENERAL: The patient is a well-nourished female, in no acute distress. The vital signs are documented above. CARDIOVASCULAR: There is a regular rate and rhythm. PULMONARY: There is good air exchange bilaterally without wheezing or rales. Her fistula does not appear to be enlarging significantly in size. It is slightly pulsatile.  I have independently interpreted her duplex of her fistula which shows that the diameters of the fistula range from 0.32 cm-0.50 cm. There are several areas of stenosis within the fistula noted.  MEDICAL ISSUES: I have independently interpreted her duplex of her fistula which shows that the diameters of the fistula range from 0.32 cm-0.50 cm. There are several areas of stenosis within the fistula noted. For this reason, I have recommended that we proceed with a fistulogram in order to determine if there is any thing we can do to assist with maturation of the fistula. Otherwise she would require consideration for new access. Says been scheduled for 4/416. I'll make further recommendations pending these results.  [place] [facility] [place] Beeper: [phone]

## 2014-11-08 NOTE — Anesthesia Postprocedure Evaluation (Signed)
Anesthesia Post Note  Patient: Destiny Day  Procedure(s) Performed: Procedure(s) (LRB): FIRST STAGE BASILIC VEIN TRANSPOSITION (Right)  Anesthesia type: General  Patient location: PACU  Post pain: Pain level controlled  Post assessment: Post-op Vital signs reviewed  Last Vitals: BP 162/76 mmHg  Pulse 71  Temp(Src) 36.1 C (Oral)  Resp 16  Ht 5\' 5"  (1.651 m)  Wt 134 lb (60.782 kg)  BMI 22.30 kg/m2  SpO2 96%  Post vital signs: Reviewed  Level of consciousness: sedated  Complications: No apparent anesthesia complications

## 2014-11-08 NOTE — Anesthesia Preprocedure Evaluation (Addendum)
Anesthesia Evaluation  Patient identified by MRN, date of birth, ID band Patient awake    Reviewed: Allergy & Precautions, NPO status , Patient's Chart, lab work & pertinent test results, reviewed documented beta blocker date and time   History of Anesthesia Complications Negative for: history of anesthetic complications  Airway Mallampati: II  TM Distance: >3 FB Neck ROM: Full    Dental no notable dental hx. (+) Dental Advisory Given   Pulmonary asthma , sleep apnea , former smoker,  breath sounds clear to auscultation  Pulmonary exam normal       Cardiovascular hypertension, Pt. on medications and Pt. on home beta blockers +CHF Rhythm:Regular Rate:Normal  08/2014 - echo - - Left ventricle: The cavity size was normal. There was moderate concentric hypertrophy. Systolic function was normal. The estimated ejection fraction was in the range of 50% to 55%. Wall motion was normal; there were no regional wall motion abnormalities. Doppler parameters are consistent with arestrictive pattern, indicative of decreased left ventricular diastolic compliance and/or increased left atrial pressure (grade 3 diastolic dysfunction). Doppler parameters are consistent withboth elevated ventricular end-diastolic filling pressure and elevated left atrial filling pressure. - Left atrium: The atrium was mildly dilated. - Atrial septum: No defect or patent foramen ovale was identified.   Neuro/Psych Anxiety    GI/Hepatic GERD-  Medicated,  Endo/Other  diabetes, Type 2, Insulin Dependent  Renal/GU ESRFRenal disease     Musculoskeletal  (+) Arthritis -,   Abdominal   Peds  Hematology  (+) anemia ,   Anesthesia Other Findings   Reproductive/Obstetrics                            Anesthesia Physical Anesthesia Plan  ASA: III  Anesthesia Plan: General   Post-op Pain Management:    Induction: Intravenous  Airway  Management Planned: LMA  Additional Equipment: None  Intra-op Plan:   Post-operative Plan: Extubation in OR  Informed Consent: I have reviewed the patients History and Physical, chart, labs and discussed the procedure including the risks, benefits and alternatives for the proposed anesthesia with the patient or authorized representative who has indicated his/her understanding and acceptance.   Dental advisory given  Plan Discussed with: CRNA  Anesthesia Plan Comments:        Anesthesia Quick Evaluation

## 2014-11-08 NOTE — Transfer of Care (Signed)
Immediate Anesthesia Transfer of Care Note  Patient: Destiny Day  Procedure(s) Performed: Procedure(s): FIRST STAGE BASILIC VEIN TRANSPOSITION (Right)  Patient Location: PACU  Anesthesia Type:General  Level of Consciousness: sedated  Airway & Oxygen Therapy: Patient Spontanous Breathing and Patient connected to nasal cannula oxygen  Post-op Assessment: Report given to RN and Post -op Vital signs reviewed and stable  Post vital signs: Reviewed and stable  Last Vitals:  Filed Vitals:   11/08/14 0834  BP: 157/63  Pulse: 69  Temp: 36.7 C  Resp: 20    Complications: No apparent anesthesia complications

## 2014-11-08 NOTE — Op Note (Signed)
    NAME: Destiny Day   MRN: [id number] DOB: [birth year]    DATE OF OPERATION: 11/08/2014  PREOP DIAGNOSIS: Stage 4 CKD   POSTOP DIAGNOSIS: Same   PROCEDURE: First stage right basilic transposition  SURGEON: Judeth Cornfield. Scot Dock, MD, FACS  ASSIST: Leontine Locket, PA  ANESTHESIA: General    EBL: min  INDICATIONS: Destiny Day is a 64 y.o. female was not yet on dialysis. She had a left basilic vein transposition which failed to mature adequately. She comes in for attempted access in the right arm. The only reasonable vein appeared to be the basilic vein and we elected to attempt this in 2 stages.  FINDINGS: 3.5 mm right basilic vein  TECHNIQUE: The patient was taken to the operating room and received general anesthetic. The the right upper extremity was prepped and draped in usual sterile fashion. A longitudinal incision was made over the basilic vein above the antecubital level.  Here the vein was mobilized proximally and distally. I dissected it distally down to where it branched and both branches were clipped and divided allowing for a more widely spatulated anastomosis. Brachial artery was dissected free beneath the fascia. The patient was then heparinized. The brachial artery was clamped proximal and distally and longitudinal arteriotomy was made. I then anastomose these basilic vein to the brachial artery using continuous 6-0 Prolene suture. I left this redundant so that the second stage would be easier to mobilize the vein. There was an excellent thrill in the fistula. Hemostasis was obtained in the wound. The wound was closed with deep 3-0 Vicryl and the skin closed with 4-0 Vicryl. Dermabond was applied. The patient tolerated the procedure well and transferred to the recovery room in stable condition. All needle and sponge counts were correct.  [name], MD, FACS [facility]  DATE OF DICTATION:   11/08/2014

## 2014-11-08 NOTE — Interval H&P Note (Signed)
History and Physical Interval Note:  11/08/2014 10:26 AM  Destiny Day  has presented today for surgery, with the diagnosis of End Stage Renal Disease N18.6  The various methods of treatment have been discussed with the patient and family. After consideration of risks, benefits and other options for treatment, the patient has consented to  Procedure(s): FIRST STAGE BASILIC VEIN TRANSPOSITION (Right) as a surgical intervention .  The patient's history has been reviewed, patient examined, no change in status, stable for surgery.  I have reviewed the patient's chart and labs.  Questions were answered to the patient's satisfaction.     [name]

## 2014-11-12 ENCOUNTER — Encounter (HOSPITAL_COMMUNITY): Payer: Self-pay | Admitting: Vascular Surgery

## 2014-11-13 ENCOUNTER — Telehealth: Payer: Self-pay | Admitting: Vascular Surgery

## 2014-11-13 NOTE — Telephone Encounter (Signed)
-----   Message from Mena Goes, RN sent at 11/08/2014 12:19 PM EDT ----- Regarding: Schedule   ----- Message -----    From: Gabriel Earing, PA-C    Sent: 11/08/2014  11:40 AM      To: [facility] Charge Pool  S/p right 1st stage BVT 11/08/14.  F/u with Dr. Scot Dock in 6 weeks with duplex of fistula.  Thanks, Aldona Bar

## 2014-11-13 NOTE — Telephone Encounter (Signed)
Spoke with patient to schedule,dpm

## 2014-11-20 ENCOUNTER — Telehealth: Payer: Self-pay | Admitting: Neurology

## 2014-11-20 DIAGNOSIS — G4733 Obstructive sleep apnea (adult) (pediatric): Secondary | ICD-10-CM

## 2014-11-20 NOTE — Telephone Encounter (Signed)
Have re-entered CPAP order, as the patient now would like to start CPAP. Order from 9/15 was over 33 mo old.  Pls set up through DME and pls schedule sleep FU with me in about 8-10 weeks, thx

## 2014-11-21 ENCOUNTER — Encounter: Payer: Self-pay | Admitting: Neurology

## 2014-11-21 ENCOUNTER — Encounter (HOSPITAL_COMMUNITY)
Admission: RE | Admit: 2014-11-21 | Discharge: 2014-11-21 | Disposition: A | Discharge status: Home / Self Care | Payer: BC Managed Care – PPO | Source: Ambulatory Visit | Attending: Nephrology | Admitting: Nephrology

## 2014-11-21 DIAGNOSIS — N184 Chronic kidney disease, stage 4 (severe): Secondary | ICD-10-CM | POA: Diagnosis not present

## 2014-11-21 LAB — POCT HEMOGLOBIN-HEMACUE: Hemoglobin: 11.8 g/dL — ABNORMAL LOW (ref 12.0–15.0)

## 2014-11-21 MED ORDER — DARBEPOETIN ALFA 100 MCG/0.5ML IJ SOSY
PREFILLED_SYRINGE | INTRAMUSCULAR | Status: AC
  Filled 2014-11-21: qty 0.5

## 2014-11-21 MED ORDER — DARBEPOETIN ALFA 100 MCG/0.5ML IJ SOSY
100.0000 ug | PREFILLED_SYRINGE | INTRAMUSCULAR | Status: DC
Start: 2014-11-21 — End: 2014-11-22
  Administered 2014-11-21: 100 ug via SUBCUTANEOUS

## 2014-11-21 NOTE — Telephone Encounter (Signed)
Patient processed with [facility].  Patient informed of need of f/u appointment post set up.

## 2014-12-10 ENCOUNTER — Encounter (HOSPITAL_COMMUNITY): Payer: Self-pay | Admitting: Emergency Medicine

## 2014-12-10 ENCOUNTER — Emergency Department (HOSPITAL_COMMUNITY)
Admission: EM | Admit: 2014-12-10 | Discharge: 2014-12-10 | Disposition: A | Discharge status: Home / Self Care | Payer: BC Managed Care – PPO | Attending: Emergency Medicine | Admitting: Emergency Medicine

## 2014-12-10 ENCOUNTER — Emergency Department (HOSPITAL_COMMUNITY): Payer: BC Managed Care – PPO

## 2014-12-10 DIAGNOSIS — M1990 Unspecified osteoarthritis, unspecified site: Secondary | ICD-10-CM | POA: Insufficient documentation

## 2014-12-10 DIAGNOSIS — E119 Type 2 diabetes mellitus without complications: Secondary | ICD-10-CM | POA: Insufficient documentation

## 2014-12-10 DIAGNOSIS — J45901 Unspecified asthma with (acute) exacerbation: Secondary | ICD-10-CM | POA: Insufficient documentation

## 2014-12-10 DIAGNOSIS — I129 Hypertensive chronic kidney disease with stage 1 through stage 4 chronic kidney disease, or unspecified chronic kidney disease: Secondary | ICD-10-CM | POA: Insufficient documentation

## 2014-12-10 DIAGNOSIS — Z8701 Personal history of pneumonia (recurrent): Secondary | ICD-10-CM | POA: Insufficient documentation

## 2014-12-10 DIAGNOSIS — R05 Cough: Secondary | ICD-10-CM

## 2014-12-10 DIAGNOSIS — Z862 Personal history of diseases of the blood and blood-forming organs and certain disorders involving the immune mechanism: Secondary | ICD-10-CM | POA: Diagnosis not present

## 2014-12-10 DIAGNOSIS — Z8659 Personal history of other mental and behavioral disorders: Secondary | ICD-10-CM | POA: Insufficient documentation

## 2014-12-10 DIAGNOSIS — Z794 Long term (current) use of insulin: Secondary | ICD-10-CM | POA: Diagnosis not present

## 2014-12-10 DIAGNOSIS — I509 Heart failure, unspecified: Secondary | ICD-10-CM | POA: Insufficient documentation

## 2014-12-10 DIAGNOSIS — Z7982 Long term (current) use of aspirin: Secondary | ICD-10-CM | POA: Insufficient documentation

## 2014-12-10 DIAGNOSIS — Z87891 Personal history of nicotine dependence: Secondary | ICD-10-CM | POA: Insufficient documentation

## 2014-12-10 DIAGNOSIS — R109 Unspecified abdominal pain: Secondary | ICD-10-CM | POA: Insufficient documentation

## 2014-12-10 DIAGNOSIS — Z79899 Other long term (current) drug therapy: Secondary | ICD-10-CM | POA: Diagnosis not present

## 2014-12-10 DIAGNOSIS — N183 Chronic kidney disease, stage 3 (moderate): Secondary | ICD-10-CM | POA: Diagnosis not present

## 2014-12-10 DIAGNOSIS — Z8719 Personal history of other diseases of the digestive system: Secondary | ICD-10-CM | POA: Diagnosis not present

## 2014-12-10 DIAGNOSIS — E876 Hypokalemia: Secondary | ICD-10-CM | POA: Insufficient documentation

## 2014-12-10 DIAGNOSIS — E785 Hyperlipidemia, unspecified: Secondary | ICD-10-CM | POA: Insufficient documentation

## 2014-12-10 DIAGNOSIS — R0602 Shortness of breath: Secondary | ICD-10-CM | POA: Diagnosis present

## 2014-12-10 DIAGNOSIS — R0600 Dyspnea, unspecified: Secondary | ICD-10-CM

## 2014-12-10 DIAGNOSIS — R059 Cough, unspecified: Secondary | ICD-10-CM

## 2014-12-10 LAB — CBC
HCT: 40.3 % (ref 36.0–46.0)
Hemoglobin: 13.5 g/dL (ref 12.0–15.0)
MCH: 29.3 pg (ref 26.0–34.0)
MCHC: 33.5 g/dL (ref 30.0–36.0)
MCV: 87.6 fL (ref 78.0–100.0)
Platelets: 292 10*3/uL (ref 150–400)
RBC: 4.6 MIL/uL (ref 3.87–5.11)
RDW: 14.6 % (ref 11.5–15.5)
WBC: 7.2 10*3/uL (ref 4.0–10.5)

## 2014-12-10 LAB — BASIC METABOLIC PANEL
Anion gap: 13 (ref 5–15)
BUN: 56 mg/dL — ABNORMAL HIGH (ref 6–20)
CO2: 25 mmol/L (ref 22–32)
Calcium: 9 mg/dL (ref 8.9–10.3)
Chloride: 91 mmol/L — ABNORMAL LOW (ref 101–111)
Creatinine, Ser: 2.89 mg/dL — ABNORMAL HIGH (ref 0.44–1.00)
GFR calc Af Amer: 19 mL/min — ABNORMAL LOW (ref >60)
GFR calc non Af Amer: 16 mL/min — ABNORMAL LOW (ref >60)
Glucose, Bld: 333 mg/dL — ABNORMAL HIGH (ref 65–99)
Potassium: 2.8 mmol/L — ABNORMAL LOW (ref 3.5–5.1)
Sodium: 129 mmol/L — ABNORMAL LOW (ref 135–145)

## 2014-12-10 LAB — I-STAT TROPONIN, ED: Troponin i, poc: 0.03 ng/mL (ref 0.00–0.08)

## 2014-12-10 LAB — BRAIN NATRIURETIC PEPTIDE: B Natriuretic Peptide: 1121.7 pg/mL — ABNORMAL HIGH (ref 0.0–100.0)

## 2014-12-10 MED ORDER — POTASSIUM CHLORIDE CRYS ER 20 MEQ PO TBCR: 20.0000 meq | EXTENDED_RELEASE_TABLET | Freq: Two times a day (BID) | ORAL | Status: DC

## 2014-12-10 MED ORDER — GUAIFENESIN 100 MG/5ML PO LIQD: 100.0000 mg | ORAL | Status: DC | PRN

## 2014-12-10 MED ORDER — INSULIN ASPART 100 UNIT/ML ~~LOC~~ SOLN
5.0000 [IU] | Freq: Once | SUBCUTANEOUS | Status: AC
  Administered 2014-12-10: 5 [IU] via SUBCUTANEOUS
  Filled 2014-12-10: qty 1

## 2014-12-10 MED ORDER — POTASSIUM CHLORIDE CRYS ER 20 MEQ PO TBCR
40.0000 meq | EXTENDED_RELEASE_TABLET | Freq: Once | ORAL | Status: AC
  Administered 2014-12-10: 40 meq via ORAL
  Filled 2014-12-10: qty 2

## 2014-12-10 NOTE — ED Provider Notes (Signed)
CSN: [id number]     Arrival date & time 12/10/14  0027 History  This chart was scribed for Destiny Flemings, MD by Mercy Moore, ED scribe.  This patient was seen in room D36C/D36C and the patient's care was started at 1:11 AM.    Chief Complaint  Patient presents with  . Shortness of Breath   The history is provided by the patient. No language interpreter was used.   HPI Comments: Destiny Day is a 64 y.o. female with PMHx of CHF brought in by ambulance, who presents to the Emergency Department complaining of progressively worsening shortness of breath, onset tonight 2 hours ago. Patient reports that she's been "dragging around" today, but states that this was unconcerning to her because she was unaware it would progress to this. Patient reports swelling and "tightness" extending across her abdomen consistent with past heart failure.   Husband reports blood pressure measured at 198/103 prior to arrival. Patient is taking several medications for her CHF including clonidine, torsemide, calcitriol, amlodipine, etc. Patient shares having her blood pressure evaluated recently: BP measured at 120/73.  Patient with history of chronic kidney disease reports improvement of her kidney function at her last visit and states that her dialysis treatment has been postponed.     Past Medical History  Diagnosis Date  . Hypertension   . Diverticulitis   . Hyperlipidemia   . Asthma   . Diabetes mellitus     insulin dependent  . Arthritis     knee  . GI bleed 03/31/2013  . CHF (congestive heart failure)   . Osteoporosis   . Seasonal allergies   . Anxiety   . Abdominal bruit   . Chronic kidney disease     stage 3  . Sleep apnea     can't afford cpap  . Pneumonia   . GERD (gastroesophageal reflux disease)     from medications  . Complication of anesthesia     " after I got home from my last procedure, I started itching."  . Anemia    Past Surgical History  Procedure Laterality Date  .  Abdominal hysterectomy  1993`  . Esophagogastroduodenoscopy N/A 03/31/2013    Procedure: ESOPHAGOGASTRODUODENOSCOPY (EGD);  Surgeon: Gatha Mayer, MD;  Location[facility] ENDOSCOPY;  Service: Endoscopy;  Laterality: N/A;  . Eye surgery      laser surgery  . Bascilic vein transposition Left 07/10/2014    Procedure: BASCILIC VEIN TRANSPOSITION;  Surgeon: Angelia Mould, MD;  Location: [place];  Service: Vascular;  Laterality: Left;  . Fistulogram Left 10/29/2014    Procedure: FISTULOGRAM;  Surgeon: Angelia Mould, MD;  Location: [facility] CATH LAB;  Service: Cardiovascular;  Laterality: Left;  . Bascilic vein transposition Right 11/08/2014    Procedure: FIRST STAGE BASILIC VEIN TRANSPOSITION;  Surgeon: Angelia Mould, MD;  Location: [facility] OR;  Service: Vascular;  Laterality: Right;   Family History  Problem Relation Age of Onset  . Colon cancer Neg Hx   . Other Mother     not sure of cause of death  . Diabetes Father   . Pancreatic cancer Maternal Grandmother    History  Substance Use Topics  . Smoking status: Former Smoker -- 0.35 packs/day for 40 years    Types: Cigarettes    Quit date: 05/10/2012  . Smokeless tobacco: Never Used  . Alcohol Use: No   OB History    No data available     Review of Systems  Constitutional: Negative for  fever and chills.  Respiratory: Positive for shortness of breath. Negative for cough and wheezing.   Cardiovascular: Negative for chest pain and leg swelling.  Gastrointestinal: Positive for abdominal pain.      Allergies  Lisinopril and Prednisone  Home Medications   Prior to Admission medications   Medication Sig Start Date End Date Taking? Authorizing Provider  amLODipine (NORVASC) 10 MG tablet Take 1 tablet (10 mg total) by mouth daily. 07/23/13   Velvet Bathe, MD  aspirin 81 MG chewable tablet Chew 81 mg by mouth daily.    Historical Provider, MD  atorvastatin (LIPITOR) 20 MG tablet Take 20 mg by mouth daily at 6 PM.    Historical  Provider, MD  budesonide-formoterol (SYMBICORT) 160-4.5 MCG/ACT inhaler Inhale 1 puff into the lungs every evening.    Historical Provider, MD  calcitRIOL (ROCALTROL) 0.25 MCG capsule Take 0.25 mcg by mouth daily.    Historical Provider, MD  carvedilol (COREG) 25 MG tablet Take 1 tablet (25 mg total) by mouth 2 (two) times daily with a meal. 06/01/13   Costin Karlyne Greenspan, MD  cloNIDine (CATAPRES) 0.1 MG tablet Take 1 tablet (0.1 mg total) by mouth 3 (three) times daily. 09/05/14   Velna Hatchet, MD  famotidine (PEPCID) 20 MG tablet Take 20 mg by mouth daily.    Historical Provider, MD  insulin aspart (NOVOLOG) 100 UNIT/ML injection Inject 0-8 Units into the skin 3 (three) times daily before meals. Sliding scale    Historical Provider, MD  insulin detemir (LEVEMIR) 100 UNIT/ML injection Inject 10 Units into the skin at bedtime.    Historical Provider, MD  isosorbide-hydrALAZINE (BIDIL) 20-37.5 MG per tablet Take 2 tablets by mouth 3 (three) times daily. 01/12/14   Barton Dubois, MD  metolazone (ZAROXOLYN) 5 MG tablet Take 5 mg by mouth daily as needed. 08/08/14   Historical Provider, MD  oxyCODONE (ROXICODONE) 5 MG immediate release tablet Take 1 tablet (5 mg total) by mouth every 6 (six) hours as needed. 11/08/14   Samantha J Rhyne, PA-C  potassium chloride SA (K-DUR,KLOR-CON) 20 MEQ tablet Take 20 mEq by mouth 2 (two) times daily.    Historical Provider, MD  torsemide (DEMADEX) 100 MG tablet Take 1 tablet (100 mg total) by mouth daily. 09/05/14   Velna Hatchet, MD  Vitamin D, Ergocalciferol, (DRISDOL) 50000 UNITS CAPS capsule Take 50,000 Units by mouth every 7 (seven) days. On wednesday    Historical Provider, MD   Triage Vitals: BP 198/103 mmHg  Pulse 97  Temp(Src) 98.2 F (36.8 C) (Oral)  Resp 17  Ht 5\' 5"  (1.651 m)  Wt 137 lb 9.6 oz (62.415 kg)  BMI 22.90 kg/m2  SpO2 94% Physical Exam  Constitutional: She is oriented to person, place, and time. She appears well-developed and well-nourished. No  distress.  HENT:  Head: Normocephalic and atraumatic.  Nose: Nose normal.  Mouth/Throat: Oropharynx is clear and moist.  Eyes: Conjunctivae and EOM are normal. Pupils are equal, round, and reactive to light.  Neck: Normal range of motion. Neck supple. No JVD present. No tracheal deviation present. No thyromegaly present.  Cardiovascular: Normal rate, regular rhythm, normal heart sounds and intact distal pulses.  Exam reveals no gallop and no friction rub.   No murmur heard. Pulmonary/Chest: Effort normal and breath sounds normal. No stridor. No respiratory distress. She has no wheezes. She has no rales. She exhibits no tenderness.  Abdominal: Soft. Bowel sounds are normal. She exhibits no distension and no mass. There is no tenderness.  There is no rebound and no guarding.  Musculoskeletal: Normal range of motion. She exhibits no edema or tenderness.  Lymphadenopathy:    She has no cervical adenopathy.  Neurological: She is alert and oriented to person, place, and time. She displays normal reflexes. She exhibits normal muscle tone. Coordination normal.  Skin: Skin is warm and dry. No rash noted. No erythema. No pallor.  Psychiatric: She has a normal mood and affect. Her behavior is normal. Judgment and thought content normal.  Nursing note and vitals reviewed.   ED Course  Procedures (including critical care time)  COORDINATION OF CARE: 1:20 AM- Discussed treatment plan with patient at bedside and patient agreed to plan.   Labs Review Labs Reviewed  BASIC METABOLIC PANEL - Abnormal; Notable for the following:    Sodium 129 (*)    Potassium 2.8 (*)    Chloride 91 (*)    Glucose, Bld 333 (*)    BUN 56 (*)    Creatinine, Ser 2.89 (*)    GFR calc non Af Amer 16 (*)    GFR calc Af Amer 19 (*)    All other components within normal limits  BRAIN NATRIURETIC PEPTIDE - Abnormal; Notable for the following:    B Natriuretic Peptide 1121.7 (*)    All other components within normal limits   CBC  I-STAT TROPOININ, ED    Imaging Review Dg Chest Port 1 View  12/10/2014   CLINICAL DATA:  Acute onset of shortness of breath. Initial encounter.  EXAM: PORTABLE CHEST - 1 VIEW  COMPARISON:  Chest radiograph performed 09/02/2014  FINDINGS: The lungs are well-aerated. Minimal bibasilar atelectasis is noted. There is no evidence of pleural effusion or pneumothorax. A right-sided skin fold is noted.  The cardiomediastinal silhouette is mildly enlarged. No acute osseous abnormalities are seen.  IMPRESSION: Minimal bibasilar atelectasis noted.  Mild cardiomegaly.   Electronically Signed   By: Garald Balding M.D.   On: 12/10/2014 02:13     EKG Interpretation   Date/Time:  Monday Dec 10 2014 00:43:17 EDT Ventricular Rate:  97 PR Interval:  164 QRS Duration: 88 QT Interval:  398 QTC Calculation: 505 R Axis:   12 Text Interpretation:  Normal sinus rhythm Septal infarct , age  undetermined Abnormal ECG Confirmed by Avilene Marrin  MD, Niobe Dick ([id number]) on  12/10/2014 12:53:41 AM      MDM   Final diagnoses:  None   64 year old female with history of COPD, diabetes, CHF, end-stage renal disease, stage IV renal disease presents to the emergency department with complaint of shortness of breath and abdominal tightness.  She reports increasing cough without fever or sputum production.  She reports that she feels as though she is about to have a hypertensive urgency and/or CHF exacerbation.  She reports that she is compliant with her medications.  She denies any diet indiscretions.  Workup here in the emergency department shows no signs of fluid overload.  Initial blood pressure was elevated, but has resolved without intervention.  There is no wheezing.  Chest x-ray is without infiltrate or fluid.  Creatinine has continued to improve.  Patient does have cough, and this may be contributing to her abdominal tightness.  She reports that she has Tussionex at home.  We'll replete potassium.  Patient to follow-up with  her doctors for recheck this week.  She has been instructed to return for worsening condition or new concerning symptoms.  I personally performed the services described in this documentation, which was scribed in  my presence. The recorded information has been reviewed and is accurate.     Destiny Flemings, MD 12/10/14 [phone]

## 2014-12-10 NOTE — ED Notes (Signed)
MD at bedside. 

## 2014-12-10 NOTE — ED Notes (Signed)
Patient with shortness of breath, increasing in the this evening.  Patient denies chest pain.  Patient is going to have dialysis, not yet in treatment.  Patient has fistula in right and left upper arms, both with good thrills.

## 2014-12-10 NOTE — Discharge Instructions (Signed)
Your workup today did not show any fluid in your lungs, no signs of infection.  You did not have any wheezing.  It is not thought that you are having worsening of your congestive heart failure or pneumonia.  Continue taking your medications as prescribed.  Your potassium was low, use the information below to boost potassium in your diet as well as taking potassium supplements.  Follow-up with your doctors.  Return to the emergency department for worsening condition or new concerning symptoms.   Cough, Adult  A cough is a reflex that helps clear your throat and airways. It can help heal the body or may be a reaction to an irritated airway. A cough may only last 2 or 3 weeks (acute) or may last more than 8 weeks (chronic).  CAUSES Acute cough:  Viral or bacterial infections. Chronic cough:  Infections.  Allergies.  Asthma.  Post-nasal drip.  Smoking.  Heartburn or acid reflux.  Some medicines.  Chronic lung problems (COPD).  Cancer. SYMPTOMS   Cough.  Fever.  Chest pain.  Increased breathing rate.  High-pitched whistling sound when breathing (wheezing).  Colored mucus that you cough up (sputum). TREATMENT   A bacterial cough may be treated with antibiotic medicine.  A viral cough must run its course and will not respond to antibiotics.  Your caregiver may recommend other treatments if you have a chronic cough. HOME CARE INSTRUCTIONS   Only take over-the-counter or prescription medicines for pain, discomfort, or fever as directed by your caregiver. Use cough suppressants only as directed by your caregiver.  Use a cold steam vaporizer or humidifier in your bedroom or home to help loosen secretions.  Sleep in a semi-upright position if your cough is worse at night.  Rest as needed.  Stop smoking if you smoke. SEEK IMMEDIATE MEDICAL CARE IF:   You have pus in your sputum.  Your cough starts to worsen.  You cannot control your cough with suppressants and are  losing sleep.  You begin coughing up blood.  You have difficulty breathing.  You develop pain which is getting worse or is uncontrolled with medicine.  You have a fever. MAKE SURE YOU:   Understand these instructions.  Will watch your condition.  Will get help right away if you are not doing well or get worse. Document Released: 01/09/2011 Document Revised: 10/05/2011 Document Reviewed: 01/09/2011 [facility] Patient Information 2015 [place], [place]. This information is not intended to replace advice given to you by your health care provider. Make sure you discuss any questions you have with your health care provider.  Hypokalemia Hypokalemia means that the amount of potassium in the blood is lower than normal.Potassium is a chemical, called an electrolyte, that helps regulate the amount of fluid in the body. It also stimulates muscle contraction and helps nerves function properly.Most of the body's potassium is inside of cells, and only a very small amount is in the blood. Because the amount in the blood is so small, minor changes can be life-threatening. CAUSES  Antibiotics.  Diarrhea or vomiting.  Using laxatives too much, which can cause diarrhea.  Chronic kidney disease.  Water pills (diuretics).  Eating disorders (bulimia).  Low magnesium level.  Sweating a lot. SIGNS AND SYMPTOMS  Weakness.  Constipation.  Fatigue.  Muscle cramps.  Mental confusion.  Skipped heartbeats or irregular heartbeat (palpitations).  Tingling or numbness. DIAGNOSIS  Your health care provider can diagnose hypokalemia with blood tests. In addition to checking your potassium level, your health care  provider may also check other lab tests. TREATMENT Hypokalemia can be treated with potassium supplements taken by mouth or adjustments in your current medicines. If your potassium level is very low, you may need to get potassium through a vein (IV) and be monitored in the hospital. A diet  high in potassium is also helpful. Foods high in potassium are:  Nuts, such as peanuts and pistachios.  Seeds, such as sunflower seeds and pumpkin seeds.  Peas, lentils, and lima beans.  Whole grain and bran cereals and breads.  Fresh fruit and vegetables, such as apricots, avocado, bananas, cantaloupe, kiwi, oranges, tomatoes, asparagus, and potatoes.  Orange and tomato juices.  Red meats.  Fruit yogurt. HOME CARE INSTRUCTIONS  Take all medicines as prescribed by your health care provider.  Maintain a healthy diet by including nutritious food, such as fruits, vegetables, nuts, whole grains, and lean meats.  If you are taking a laxative, be sure to follow the directions on the label. SEEK MEDICAL CARE IF:  Your weakness gets worse.  You feel your heart pounding or racing.  You are vomiting or having diarrhea.  You are diabetic and having trouble keeping your blood glucose in the normal range. SEEK IMMEDIATE MEDICAL CARE IF:  You have chest pain, shortness of breath, or dizziness.  You are vomiting or having diarrhea for more than 2 days.  You faint. MAKE SURE YOU:   Understand these instructions.  Will watch your condition.  Will get help right away if you are not doing well or get worse. Document Released: 07/13/2005 Document Revised: 05/03/2013 Document Reviewed: 01/13/2013 [facility] Patient Information 2015 [place], [place]. This information is not intended to replace advice given to you by your health care provider. Make sure you discuss any questions you have with your health care provider.  Potassium Content of Foods Potassium is a mineral found in many foods and drinks. It helps keep fluids and minerals balanced in your body and affects how steadily your heart beats. Potassium also helps control your blood pressure and keep your muscles and nervous system healthy. Certain health conditions and medicines may change the balance of potassium in your body. When  this happens, you can help balance your level of potassium through the foods that you do or do not eat. Your health care provider or dietitian may recommend an amount of potassium that you should have each day. The following lists of foods provide the amount of potassium (in parentheses) per serving in each item. HIGH IN POTASSIUM  The following foods and beverages have 200 mg or more of potassium per serving:  Apricots, 2 raw or 5 dry (200 mg).  Artichoke, 1 medium (345 mg).  Avocado, raw,  each (245 mg).  Banana, 1 medium (425 mg).  Beans, lima, or baked beans, canned,  cup (280 mg).  Beans, white, canned,  cup (595 mg).  Beef roast, 3 oz (320 mg).  Beef, ground, 3 oz (270 mg).  Beets, raw or cooked,  cup (260 mg).  Bran muffin, 2 oz (300 mg).  Broccoli,  cup (230 mg).  Brussels sprouts,  cup (250 mg).  Cantaloupe,  cup (215 mg).  Cereal, 100% bran,  cup (200-400 mg).  Cheeseburger, single, fast food, 1 each (225-400 mg).  Chicken, 3 oz (220 mg).  Clams, canned, 3 oz (535 mg).  Crab, 3 oz (225 mg).  Dates, 5 each (270 mg).  Dried beans and peas,  cup (300-475 mg).  Figs, dried, 2 each (260 mg).  Fish: halibut, tuna, cod, snapper, 3 oz (480 mg).  Fish: salmon, haddock, swordfish, perch, 3 oz (300 mg).  Fish, tuna, canned 3 oz (200 mg).  [place] fries, fast food, 3 oz (470 mg).  Granola with fruit and nuts,  cup (200 mg).  Grapefruit juice,  cup (200 mg).  Greens, beet,  cup (655 mg).  Honeydew melon,  cup (200 mg).  Kale, raw, 1 cup (300 mg).  Kiwi, 1 medium (240 mg).  Kohlrabi, rutabaga, parsnips,  cup (280 mg).  Lentils,  cup (365 mg).  Mango, 1 each (325 mg).  Milk, chocolate, 1 cup (420 mg).  Milk: nonfat, low-fat, whole, buttermilk, 1 cup (350-380 mg).  Molasses, 1 Tbsp (295 mg).  Mushrooms,  cup (280) mg.  Nectarine, 1 each (275 mg).  Nuts: almonds, peanuts, hazelnuts, [place], cashew, mixed, 1 oz (200  mg).  Nuts, pistachios, 1 oz (295 mg).  Orange, 1 each (240 mg).  Orange juice,  cup (235 mg).  Papaya, medium,  fruit (390 mg).  Peanut butter, chunky, 2 Tbsp (240 mg).  Peanut butter, smooth, 2 Tbsp (210 mg).  Pear, 1 medium (200 mg).  Pomegranate, 1 whole (400 mg).  Pomegranate juice,  cup (215 mg).  Pork, 3 oz (350 mg).  Potato chips, salted, 1 oz (465 mg).  Potato, baked with skin, 1 medium (925 mg).  Potatoes, boiled,  cup (255 mg).  Potatoes, mashed,  cup (330 mg).  Prune juice,  cup (370 mg).  Prunes, 5 each (305 mg).  Pudding, chocolate,  cup (230 mg).  Pumpkin, canned,  cup (250 mg).  Raisins, seedless,  cup (270 mg).  Seeds, sunflower or pumpkin, 1 oz (240 mg).  Soy milk, 1 cup (300 mg).  Spinach,  cup (420 mg).  Spinach, canned,  cup (370 mg).  Sweet potato, baked with skin, 1 medium (450 mg).  Swiss chard,  cup (480 mg).  Tomato or vegetable juice,  cup (275 mg).  Tomato sauce or puree,  cup (400-550 mg).  Tomato, raw, 1 medium (290 mg).  Tomatoes, canned,  cup (200-300 mg).  [place], 3 oz (250 mg).  Wheat germ, 1 oz (250 mg).  Winter squash,  cup (250 mg).  Yogurt, plain or fruited, 6 oz (260-435 mg).  Zucchini,  cup (220 mg). MODERATE IN POTASSIUM The following foods and beverages have 50-200 mg of potassium per serving:  Apple, 1 each (150 mg).  Apple juice,  cup (150 mg).  Applesauce,  cup (90 mg).  Apricot nectar,  cup (140 mg).  Asparagus, small spears,  cup or 6 spears (155 mg).  Bagel, cinnamon raisin, 1 each (130 mg).  Bagel, egg or plain, 4 in., 1 each (70 mg).  Beans, green,  cup (90 mg).  Beans, yellow,  cup (190 mg).  Beer, regular, 12 oz (100 mg).  Beets, canned,  cup (125 mg).  Blackberries,  cup (115 mg).  Blueberries,  cup (60 mg).  Bread, whole wheat, 1 slice (70 mg).  Broccoli, raw,  cup (145 mg).  Cabbage,  cup (150 mg).  Carrots, cooked or raw,  cup  (180 mg).  Cauliflower, raw,  cup (150 mg).  Celery, raw,  cup (155 mg).  Cereal, bran flakes, cup (120-150 mg).  Cheese, cottage,  cup (110 mg).  Cherries, 10 each (150 mg).  Chocolate, 1 oz bar (165 mg).  Coffee, brewed 6 oz (90 mg).  Corn,  cup or 1 ear (195 mg).  Cucumbers,  cup (80 mg).  Egg, large, 1 each (60 mg).  Eggplant,  cup (60 mg).  Endive, raw, cup (80 mg).  English muffin, 1 each (65 mg).  Fish, orange roughy, 3 oz (150 mg).  Frankfurter, beef or pork, 1 each (75 mg).  Fruit cocktail,  cup (115 mg).  Grape juice,  cup (170 mg).  Grapefruit,  fruit (175 mg).  Grapes,  cup (155 mg).  Greens: kale, turnip, collard,  cup (110-150 mg).  Ice cream or frozen yogurt, chocolate,  cup (175 mg).  Ice cream or frozen yogurt, vanilla,  cup (120-150 mg).  Lemons, limes, 1 each (80 mg).  Lettuce, all types, 1 cup (100 mg).  Mixed vegetables,  cup (150 mg).  Mushrooms, raw,  cup (110 mg).  Nuts: walnuts, pecans, or macadamia, 1 oz (125 mg).  Oatmeal,  cup (80 mg).  Okra,  cup (110 mg).  Onions, raw,  cup (120 mg).  Peach, 1 each (185 mg).  Peaches, canned,  cup (120 mg).  Pears, canned,  cup (120 mg).  Peas, green, frozen,  cup (90 mg).  Peppers, green,  cup (130 mg).  Peppers, red,  cup (160 mg).  Pineapple juice,  cup (165 mg).  Pineapple, fresh or canned,  cup (100 mg).  Plums, 1 each (105 mg).  Pudding, vanilla,  cup (150 mg).  Raspberries,  cup (90 mg).  Rhubarb,  cup (115 mg).  Rice, wild,  cup (80 mg).  Shrimp, 3 oz (155 mg).  Spinach, raw, 1 cup (170 mg).  Strawberries,  cup (125 mg).  Summer squash  cup (175-200 mg).  Swiss chard, raw, 1 cup (135 mg).  Tangerines, 1 each (140 mg).  Tea, brewed, 6 oz (65 mg).  Turnips,  cup (140 mg).  Watermelon,  cup (85 mg).  Wine, red, table, 5 oz (180 mg).  Wine, white, table, 5 oz (100 mg). LOW IN POTASSIUM The following foods  and beverages have less than 50 mg of potassium per serving.  Bread, white, 1 slice (30 mg).  Carbonated beverages, 12 oz (less than 5 mg).  Cheese, 1 oz (20-30 mg).  Cranberries,  cup (45 mg).  Cranberry juice cocktail,  cup (20 mg).  Fats and oils, 1 Tbsp (less than 5 mg).  Hummus, 1 Tbsp (32 mg).  Nectar: papaya, mango, or pear,  cup (35 mg).  Rice, white or brown,  cup (50 mg).  Spaghetti or macaroni,  cup cooked (30 mg).  Tortilla, flour or corn, 1 each (50 mg).  Waffle, 4 in., 1 each (50 mg).  Water chestnuts,  cup (40 mg). Document Released: 02/24/2005 Document Revised: 07/18/2013 Document Reviewed: 06/09/2013 [facility] Patient Information 2015 [place], [place]. This information is not intended to replace advice given to you by your health care provider. Make sure you discuss any questions you have with your health care provider.

## 2014-12-19 ENCOUNTER — Encounter (HOSPITAL_COMMUNITY)
Admission: RE | Admit: 2014-12-19 | Discharge: 2014-12-19 | Disposition: A | Discharge status: Home / Self Care | Payer: BC Managed Care – PPO | Source: Ambulatory Visit | Attending: Nephrology | Admitting: Nephrology

## 2014-12-19 DIAGNOSIS — Z5181 Encounter for therapeutic drug level monitoring: Secondary | ICD-10-CM | POA: Diagnosis not present

## 2014-12-19 DIAGNOSIS — Z79899 Other long term (current) drug therapy: Secondary | ICD-10-CM | POA: Diagnosis not present

## 2014-12-19 DIAGNOSIS — N184 Chronic kidney disease, stage 4 (severe): Secondary | ICD-10-CM | POA: Diagnosis not present

## 2014-12-19 DIAGNOSIS — D631 Anemia in chronic kidney disease: Secondary | ICD-10-CM | POA: Diagnosis not present

## 2014-12-19 LAB — FERRITIN: Ferritin: 182 ng/mL (ref 11–307)

## 2014-12-19 LAB — IRON AND TIBC
Iron: 60 ug/dL (ref 28–170)
Saturation Ratios: 23 % (ref 10.4–31.8)
TIBC: 263 ug/dL (ref 250–450)
UIBC: 203 ug/dL

## 2014-12-19 MED ORDER — DARBEPOETIN ALFA 100 MCG/0.5ML IJ SOSY: 100.0000 ug | PREFILLED_SYRINGE | INTRAMUSCULAR | Status: DC

## 2014-12-20 LAB — POCT HEMOGLOBIN-HEMACUE: Hemoglobin: 12.3 g/dL (ref 12.0–15.0)

## 2014-12-21 ENCOUNTER — Encounter: Payer: Self-pay | Admitting: Vascular Surgery

## 2014-12-26 ENCOUNTER — Ambulatory Visit (HOSPITAL_COMMUNITY)
Admission: RE | Admit: 2014-12-26 | Discharge: 2014-12-26 | Disposition: A | Discharge status: Home / Self Care | Payer: BC Managed Care – PPO | Source: Ambulatory Visit | Attending: Vascular Surgery | Admitting: Vascular Surgery

## 2014-12-26 ENCOUNTER — Ambulatory Visit (INDEPENDENT_AMBULATORY_CARE_PROVIDER_SITE_OTHER): Payer: Self-pay | Admitting: Vascular Surgery

## 2014-12-26 ENCOUNTER — Encounter: Payer: Self-pay | Admitting: Vascular Surgery

## 2014-12-26 VITALS — BP 136/70 | HR 80 | Temp 97.9°F | Resp 16 | Ht 65.0 in | Wt 140.0 lb

## 2014-12-26 DIAGNOSIS — N184 Chronic kidney disease, stage 4 (severe): Secondary | ICD-10-CM

## 2014-12-26 DIAGNOSIS — N186 End stage renal disease: Secondary | ICD-10-CM | POA: Insufficient documentation

## 2014-12-26 DIAGNOSIS — Z4931 Encounter for adequacy testing for hemodialysis: Secondary | ICD-10-CM

## 2014-12-26 NOTE — Progress Notes (Signed)
Patient name: Destiny Day MRN: [id number] DOB: December 30, 1950 Sex: female  REASON FOR VISIT: follow up of AV fistula  HPI: Destiny Day is a 64 y.o. female who had a the basilic vein transposition in the left arm which failed. Most recently she had a first stage right basilic vein transposition on 11/08/2014. At the time of surgery she was noted to have a 3.5 mm right basilic vein. She comes in for his 6 week follow up visit.  She has no specific complaints except for some mild tenderness where the medial aspect of right arm rubs against her chest.  Current Outpatient Prescriptions  Medication Sig Dispense Refill  . amLODipine (NORVASC) 10 MG tablet Take 1 tablet (10 mg total) by mouth daily. 30 tablet 0  . aspirin 81 MG chewable tablet Chew 81 mg by mouth daily.    Marland Kitchen atorvastatin (LIPITOR) 20 MG tablet Take 20 mg by mouth daily at 6 PM.    . budesonide-formoterol (SYMBICORT) 160-4.5 MCG/ACT inhaler Inhale 1 puff into the lungs every evening.    . calcitRIOL (ROCALTROL) 0.25 MCG capsule Take 0.25 mcg by mouth daily.    . carvedilol (COREG) 25 MG tablet Take 1 tablet (25 mg total) by mouth 2 (two) times daily with a meal. 60 tablet 1  . cloNIDine (CATAPRES) 0.1 MG tablet Take 1 tablet (0.1 mg total) by mouth 3 (three) times daily. 90 tablet 2  . famotidine (PEPCID) 20 MG tablet Take 20 mg by mouth daily.    Marland Kitchen guaiFENesin (ROBITUSSIN) 100 MG/5ML liquid Take 5-10 mLs (100-200 mg total) by mouth every 4 (four) hours as needed for cough. 60 mL 0  . insulin aspart (NOVOLOG) 100 UNIT/ML injection Inject 0-8 Units into the skin 3 (three) times daily before meals. Sliding scale    . insulin detemir (LEVEMIR) 100 UNIT/ML injection Inject 10 Units into the skin at bedtime.    . isosorbide-hydrALAZINE (BIDIL) 20-37.5 MG per tablet Take 2 tablets by mouth 3 (three) times daily. 180 tablet 1  . metolazone (ZAROXOLYN) 5 MG tablet Take 5 mg by mouth daily as needed.    Marland Kitchen oxyCODONE (ROXICODONE) 5  MG immediate release tablet Take 1 tablet (5 mg total) by mouth every 6 (six) hours as needed. 20 tablet 0  . potassium chloride SA (K-DUR,KLOR-CON) 20 MEQ tablet Take 1 tablet (20 mEq total) by mouth 2 (two) times daily. 20 tablet 0  . torsemide (DEMADEX) 100 MG tablet Take 1 tablet (100 mg total) by mouth daily. 30 tablet 2  . Vitamin D, Ergocalciferol, (DRISDOL) 50000 UNITS CAPS capsule Take 50,000 Units by mouth every 7 (seven) days. On wednesday     No current facility-administered medications for this visit.   REVIEW OF SYSTEMS: Valu.Nieves ] denotes positive finding; [  ] denotes negative finding  CARDIOVASCULAR:  [ ]  chest pain   [ ]  dyspnea on exertion    CONSTITUTIONAL:  [ ]  fever   [ ]  chills  PHYSICAL EXAM: Filed Vitals:   12/26/14 1409  BP: 136/70  Pulse: 80  Temp: 97.9 F (36.6 C)  TempSrc: Oral  Resp: 16  Height: 5\' 5"  (1.651 m)  Weight: 140 lb (63.504 kg)  SpO2: 98%   GENERAL: The patient is a well-nourished female, in no acute distress. The vital signs are documented above. CARDIOVASCULAR: There is a regular rate and rhythm. PULMONARY: There is good air exchange bilaterally without wheezing or rales. Her basilic vein transposition has a good thrill.  DUPLEX  RIGHT AV FISTULA: The fistula in the right arm is patent with diameters ranging from 0.40 cm-0.73 cm. There is some elevated velocities at the anastomosis noted but no other significant problems noted.  MEDICAL ISSUES: STAGE IV CHRONIC KIDNEY DISEASE: Her basilic vein appears to be maturing adequately. For this reason we will proceed with a second stage to her basilic vein transposition. This has been scheduled for 01/18/2015. I have discussed the procedure and potential claudications with her and she is agreeable to proceed.  [name] [facility] [place]: [phone]

## 2014-12-27 ENCOUNTER — Other Ambulatory Visit: Payer: Self-pay

## 2015-01-02 ENCOUNTER — Encounter (HOSPITAL_COMMUNITY)
Admission: RE | Admit: 2015-01-02 | Discharge: 2015-01-02 | Disposition: A | Discharge status: Home / Self Care | Payer: BC Managed Care – PPO | Source: Ambulatory Visit | Attending: Nephrology | Admitting: Nephrology

## 2015-01-02 DIAGNOSIS — D631 Anemia in chronic kidney disease: Secondary | ICD-10-CM | POA: Diagnosis not present

## 2015-01-02 DIAGNOSIS — N184 Chronic kidney disease, stage 4 (severe): Secondary | ICD-10-CM | POA: Diagnosis not present

## 2015-01-02 DIAGNOSIS — Z5181 Encounter for therapeutic drug level monitoring: Secondary | ICD-10-CM | POA: Diagnosis not present

## 2015-01-02 DIAGNOSIS — Z79899 Other long term (current) drug therapy: Secondary | ICD-10-CM | POA: Diagnosis not present

## 2015-01-02 LAB — POCT HEMOGLOBIN-HEMACUE: Hemoglobin: 12 g/dL (ref 12.0–15.0)

## 2015-01-02 MED ORDER — DARBEPOETIN ALFA 100 MCG/0.5ML IJ SOSY: 100.0000 ug | PREFILLED_SYRINGE | INTRAMUSCULAR | Status: DC

## 2015-01-09 ENCOUNTER — Encounter (HOSPITAL_COMMUNITY)
Admission: RE | Admit: 2015-01-09 | Discharge: 2015-01-09 | Disposition: A | Discharge status: Home / Self Care | Payer: BC Managed Care – PPO | Source: Ambulatory Visit | Attending: Vascular Surgery | Admitting: Vascular Surgery

## 2015-01-09 ENCOUNTER — Encounter (HOSPITAL_COMMUNITY): Payer: Self-pay

## 2015-01-09 DIAGNOSIS — Z01818 Encounter for other preprocedural examination: Secondary | ICD-10-CM | POA: Diagnosis not present

## 2015-01-09 LAB — SURGICAL PCR SCREEN
MRSA, PCR: POSITIVE — AB
Staphylococcus aureus: POSITIVE — AB

## 2015-01-09 LAB — GLUCOSE, CAPILLARY: Glucose-Capillary: 131 mg/dL — ABNORMAL HIGH (ref 65–99)

## 2015-01-09 NOTE — Pre-Procedure Instructions (Signed)
Destiny Day  01/09/2015      [id number] [name], [place] [id number] [place] [id number] Phone: [phone] Fax: [phone]  [facility] [place] ([place] [id number] [place] ([place] [address] Phone: [phone] Fax: [phone]    Your procedure is scheduled on Tuesday, June 24th   Report to [place] at  5:30 A.M.  Call this number if you have problems the morning of surgery:  [phone]   Remember:  Do not eat food or drink liquids after midnight Monday .  Take these medicines the morning of surgery with A SIP OF WATER : Amlodipine, Carvedilol, Famotidine, Oxycodone.                          Do not wear jewelry, make-up or nail polish.  Do not wear lotions, powders, or perfumes.  You may wear deodorant.  Do not shave underarms & legs 48 hours prior to surgery.     Do not bring valuables to the hospital.  [facility] is not responsible for any belongings or valuables.  Contacts, dentures or bridgework may not be worn into surgery.  Leave your suitcase in the car.  After surgery it may be brought to your room. Patients discharged the day of surgery will not be allowed to drive home.   Name and phone number of your driver:     Special instructions:  "Preparing for Surgery" instruction sheet.  Please read over the following fact sheets that you were given. Pain Booklet and Surgical Site Infection Prevention

## 2015-01-09 NOTE — Progress Notes (Addendum)
Saw Dr. Christen Butter when she came into hospital with CHF.  LOV was in 2015 and he has "cut her loose" Dr Lorrene Reid is her nephrologist.   PCP is Dr. Ardeth Perfect.   Blood sugar today was 131. Sleep study was done at [facility] back in Oct 2015.  Uses CPAP.--setting at 7 (she thinks)

## 2015-01-10 LAB — HEMOGLOBIN A1C
Hgb A1c MFr Bld: 11 % — ABNORMAL HIGH (ref 4.8–5.6)
Mean Plasma Glucose: 269 mg/dL

## 2015-01-16 ENCOUNTER — Inpatient Hospital Stay (HOSPITAL_COMMUNITY): Admission: RE | Admit: 2015-01-16 | Payer: BC Managed Care – PPO | Source: Ambulatory Visit

## 2015-01-18 ENCOUNTER — Ambulatory Visit (HOSPITAL_COMMUNITY)
Admission: RE | Admit: 2015-01-18 | Discharge: 2015-01-18 | Disposition: A | Discharge status: Home / Self Care | Payer: BC Managed Care – PPO | Source: Ambulatory Visit | Attending: Vascular Surgery | Admitting: Vascular Surgery

## 2015-01-18 ENCOUNTER — Other Ambulatory Visit: Payer: Self-pay | Admitting: *Deleted

## 2015-01-18 ENCOUNTER — Ambulatory Visit (HOSPITAL_COMMUNITY): Payer: BC Managed Care – PPO | Admitting: Anesthesiology

## 2015-01-18 ENCOUNTER — Encounter (HOSPITAL_COMMUNITY)
Admission: RE | Disposition: A | Discharge status: Home / Self Care | Payer: Self-pay | Source: Ambulatory Visit | Attending: Vascular Surgery

## 2015-01-18 ENCOUNTER — Encounter (HOSPITAL_COMMUNITY): Payer: Self-pay | Admitting: Certified Registered"

## 2015-01-18 DIAGNOSIS — N186 End stage renal disease: Secondary | ICD-10-CM

## 2015-01-18 DIAGNOSIS — N184 Chronic kidney disease, stage 4 (severe): Secondary | ICD-10-CM | POA: Diagnosis not present

## 2015-01-18 DIAGNOSIS — Z4931 Encounter for adequacy testing for hemodialysis: Secondary | ICD-10-CM

## 2015-01-18 DIAGNOSIS — G4730 Sleep apnea, unspecified: Secondary | ICD-10-CM | POA: Diagnosis not present

## 2015-01-18 DIAGNOSIS — J45909 Unspecified asthma, uncomplicated: Secondary | ICD-10-CM | POA: Insufficient documentation

## 2015-01-18 DIAGNOSIS — I129 Hypertensive chronic kidney disease with stage 1 through stage 4 chronic kidney disease, or unspecified chronic kidney disease: Secondary | ICD-10-CM | POA: Insufficient documentation

## 2015-01-18 DIAGNOSIS — Z992 Dependence on renal dialysis: Secondary | ICD-10-CM | POA: Diagnosis not present

## 2015-01-18 DIAGNOSIS — F419 Anxiety disorder, unspecified: Secondary | ICD-10-CM | POA: Diagnosis not present

## 2015-01-18 DIAGNOSIS — E118 Type 2 diabetes mellitus with unspecified complications: Secondary | ICD-10-CM | POA: Insufficient documentation

## 2015-01-18 DIAGNOSIS — Z7982 Long term (current) use of aspirin: Secondary | ICD-10-CM | POA: Diagnosis not present

## 2015-01-18 DIAGNOSIS — I509 Heart failure, unspecified: Secondary | ICD-10-CM | POA: Diagnosis not present

## 2015-01-18 DIAGNOSIS — Z79899 Other long term (current) drug therapy: Secondary | ICD-10-CM | POA: Diagnosis not present

## 2015-01-18 DIAGNOSIS — Z794 Long term (current) use of insulin: Secondary | ICD-10-CM | POA: Diagnosis not present

## 2015-01-18 DIAGNOSIS — Z87891 Personal history of nicotine dependence: Secondary | ICD-10-CM | POA: Insufficient documentation

## 2015-01-18 HISTORY — PX: BASCILIC VEIN TRANSPOSITION: SHX5742

## 2015-01-18 HISTORY — PX: PATCH ANGIOPLASTY: SHX6230

## 2015-01-18 LAB — POCT I-STAT 4, (NA,K, GLUC, HGB,HCT)
Glucose, Bld: 141 mg/dL — ABNORMAL HIGH (ref 65–99)
HCT: 35 % — ABNORMAL LOW (ref 36.0–46.0)
Hemoglobin: 11.9 g/dL — ABNORMAL LOW (ref 12.0–15.0)
Potassium: 3.4 mmol/L — ABNORMAL LOW (ref 3.5–5.1)
Sodium: 136 mmol/L (ref 135–145)

## 2015-01-18 LAB — GLUCOSE, CAPILLARY
Glucose-Capillary: 134 mg/dL — ABNORMAL HIGH (ref 65–99)
Glucose-Capillary: 132 mg/dL — ABNORMAL HIGH (ref 65–99)
Glucose-Capillary: 134 mg/dL — ABNORMAL HIGH (ref 65–99)

## 2015-01-18 SURGERY — TRANSPOSITION, VEIN, BASILIC
Anesthesia: General | Site: Arm Upper | Laterality: Right

## 2015-01-18 MED ORDER — DEXTROSE 5 % IV SOLN
1.5000 g | INTRAVENOUS | Status: AC
  Administered 2015-01-18: 1.5 g via INTRAVENOUS
  Filled 2015-01-18: qty 1.5

## 2015-01-18 MED ORDER — HYDROMORPHONE HCL 1 MG/ML IJ SOLN
INTRAMUSCULAR | Status: AC
  Filled 2015-01-18: qty 1

## 2015-01-18 MED ORDER — PROTAMINE SULFATE 10 MG/ML IV SOLN
INTRAVENOUS | Status: DC | PRN
  Administered 2015-01-18: 30 mg via INTRAVENOUS

## 2015-01-18 MED ORDER — PROMETHAZINE HCL 25 MG/ML IJ SOLN
6.2500 mg | INTRAMUSCULAR | Status: DC | PRN
  Administered 2015-01-18: 6.25 mg via INTRAVENOUS

## 2015-01-18 MED ORDER — CHLORHEXIDINE GLUCONATE CLOTH 2 % EX PADS: 6.0000 | MEDICATED_PAD | Freq: Once | CUTANEOUS | Status: DC

## 2015-01-18 MED ORDER — LIDOCAINE HCL (CARDIAC) 20 MG/ML IV SOLN
INTRAVENOUS | Status: DC | PRN
  Administered 2015-01-18: 60 mg via INTRAVENOUS

## 2015-01-18 MED ORDER — MIDAZOLAM HCL 2 MG/2ML IJ SOLN
INTRAMUSCULAR | Status: AC
  Filled 2015-01-18: qty 2

## 2015-01-18 MED ORDER — PHENYLEPHRINE HCL 10 MG/ML IJ SOLN
10.0000 mg | INTRAVENOUS | Status: DC | PRN
  Administered 2015-01-18: 20 ug/min via INTRAVENOUS

## 2015-01-18 MED ORDER — HEPARIN SODIUM (PORCINE) 1000 UNIT/ML IJ SOLN
INTRAMUSCULAR | Status: DC | PRN
  Administered 2015-01-18: 5000 [IU] via INTRAVENOUS

## 2015-01-18 MED ORDER — LIDOCAINE HCL (CARDIAC) 20 MG/ML IV SOLN
INTRAVENOUS | Status: AC
  Filled 2015-01-18: qty 5

## 2015-01-18 MED ORDER — HEPARIN SODIUM (PORCINE) 1000 UNIT/ML IJ SOLN
INTRAMUSCULAR | Status: AC
  Filled 2015-01-18: qty 1

## 2015-01-18 MED ORDER — HYDROMORPHONE HCL 1 MG/ML IJ SOLN
0.2500 mg | INTRAMUSCULAR | Status: DC | PRN
  Administered 2015-01-18 (×3): 0.5 mg via INTRAVENOUS

## 2015-01-18 MED ORDER — FENTANYL CITRATE (PF) 250 MCG/5ML IJ SOLN
INTRAMUSCULAR | Status: AC
  Filled 2015-01-18: qty 5

## 2015-01-18 MED ORDER — ONDANSETRON HCL 4 MG/2ML IJ SOLN
INTRAMUSCULAR | Status: DC | PRN
  Administered 2015-01-18: 4 mg via INTRAVENOUS

## 2015-01-18 MED ORDER — OXYCODONE HCL 5 MG PO TABS
ORAL_TABLET | ORAL | Status: AC
  Filled 2015-01-18: qty 1

## 2015-01-18 MED ORDER — FENTANYL CITRATE (PF) 250 MCG/5ML IJ SOLN
INTRAMUSCULAR | Status: DC | PRN
  Administered 2015-01-18 (×2): 25 ug via INTRAVENOUS
  Administered 2015-01-18: 50 ug via INTRAVENOUS

## 2015-01-18 MED ORDER — SODIUM CHLORIDE 0.9 % IR SOLN
Status: DC | PRN
  Administered 2015-01-18: 11:00:00

## 2015-01-18 MED ORDER — PROPOFOL 10 MG/ML IV BOLUS
INTRAVENOUS | Status: AC
  Filled 2015-01-18: qty 20

## 2015-01-18 MED ORDER — SODIUM CHLORIDE 0.9 % IV SOLN
INTRAVENOUS | Status: DC
  Administered 2015-01-18 (×2): via INTRAVENOUS

## 2015-01-18 MED ORDER — MEPERIDINE HCL 25 MG/ML IJ SOLN: 6.2500 mg | INTRAMUSCULAR | Status: DC | PRN

## 2015-01-18 MED ORDER — 0.9 % SODIUM CHLORIDE (POUR BTL) OPTIME
TOPICAL | Status: DC | PRN
  Administered 2015-01-18: 1000 mL

## 2015-01-18 MED ORDER — PHENYLEPHRINE HCL 10 MG/ML IJ SOLN
INTRAMUSCULAR | Status: DC | PRN
  Administered 2015-01-18: 120 ug via INTRAVENOUS
  Administered 2015-01-18: 80 ug via INTRAVENOUS

## 2015-01-18 MED ORDER — OXYCODONE HCL 5 MG PO TABS: 5.0000 mg | ORAL_TABLET | Freq: Once | ORAL | Status: DC

## 2015-01-18 MED ORDER — ONDANSETRON HCL 4 MG/2ML IJ SOLN
INTRAMUSCULAR | Status: AC
  Filled 2015-01-18: qty 2

## 2015-01-18 MED ORDER — OXYCODONE HCL 5 MG PO TABS: 5.0000 mg | ORAL_TABLET | Freq: Four times a day (QID) | ORAL | Status: DC | PRN

## 2015-01-18 MED ORDER — PROPOFOL 10 MG/ML IV BOLUS
INTRAVENOUS | Status: DC | PRN
  Administered 2015-01-18: 150 mg via INTRAVENOUS

## 2015-01-18 MED ORDER — MIDAZOLAM HCL 2 MG/2ML IJ SOLN
INTRAMUSCULAR | Status: DC | PRN
  Administered 2015-01-18: 2 mg via INTRAVENOUS

## 2015-01-18 MED ORDER — ARTIFICIAL TEARS OP OINT
TOPICAL_OINTMENT | OPHTHALMIC | Status: AC
  Filled 2015-01-18: qty 3.5

## 2015-01-18 SURGICAL SUPPLY — 34 items
CANISTER SUCTION 2500CC (MISCELLANEOUS) ×3 IMPLANT
CANNULA VESSEL 3MM 2 BLNT TIP (CANNULA) IMPLANT
CLIP TI MEDIUM 24 (CLIP) ×3 IMPLANT
CLIP TI WIDE RED SMALL 24 (CLIP) ×3 IMPLANT
COVER PROBE W GEL 5X96 (DRAPES) ×3 IMPLANT
DECANTER SPIKE VIAL GLASS SM (MISCELLANEOUS) ×3 IMPLANT
DERMABOND ADVANCED (GAUZE/BANDAGES/DRESSINGS) ×2
DERMABOND ADVANCED .7 DNX12 (GAUZE/BANDAGES/DRESSINGS) ×1 IMPLANT
DRAIN PENROSE 1/2X12 LTX STRL (WOUND CARE) IMPLANT
ELECT REM PT RETURN 9FT ADLT (ELECTROSURGICAL) ×3
ELECTRODE REM PT RTRN 9FT ADLT (ELECTROSURGICAL) ×1 IMPLANT
GLOVE BIO SURGEON STRL SZ 6.5 (GLOVE) ×6 IMPLANT
GLOVE BIO SURGEON STRL SZ7.5 (GLOVE) ×3 IMPLANT
GLOVE BIO SURGEONS STRL SZ 6.5 (GLOVE) ×3
GLOVE BIOGEL PI IND STRL 6.5 (GLOVE) ×1 IMPLANT
GLOVE BIOGEL PI IND STRL 8 (GLOVE) ×1 IMPLANT
GLOVE BIOGEL PI INDICATOR 6.5 (GLOVE) ×2
GLOVE BIOGEL PI INDICATOR 8 (GLOVE) ×2
GOWN STRL REUS W/ TWL LRG LVL3 (GOWN DISPOSABLE) ×5 IMPLANT
GOWN STRL REUS W/TWL LRG LVL3 (GOWN DISPOSABLE) ×10
KIT BASIN OR (CUSTOM PROCEDURE TRAY) ×3 IMPLANT
KIT ROOM TURNOVER OR (KITS) ×3 IMPLANT
NS IRRIG 1000ML POUR BTL (IV SOLUTION) ×3 IMPLANT
PACK CV ACCESS (CUSTOM PROCEDURE TRAY) ×3 IMPLANT
PAD ARMBOARD 7.5X6 YLW CONV (MISCELLANEOUS) ×6 IMPLANT
PATCH VASCULAR VASCU GUARD 1X6 (Vascular Products) ×3 IMPLANT
SPONGE SURGIFOAM ABS GEL 100 (HEMOSTASIS) IMPLANT
SUT PROLENE 6 0 BV (SUTURE) ×12 IMPLANT
SUT SILK 2 0 SH (SUTURE) ×3 IMPLANT
SUT VIC AB 3-0 SH 27 (SUTURE) ×6
SUT VIC AB 3-0 SH 27X BRD (SUTURE) ×3 IMPLANT
SUT VICRYL 4-0 PS2 18IN ABS (SUTURE) ×6 IMPLANT
UNDERPAD 30X30 INCONTINENT (UNDERPADS AND DIAPERS) ×3 IMPLANT
WATER STERILE IRR 1000ML POUR (IV SOLUTION) ×3 IMPLANT

## 2015-01-18 NOTE — Op Note (Signed)
    NAME: Destiny Day   MRN: [id number] DOB: 07-15-1951    DATE OF OPERATION: 01/18/2015  PREOP DIAGNOSIS: Stage IV chronic kidney disease  POSTOP DIAGNOSIS: Same  PROCEDURE:  1. Right second stage basilic vein transposition 2. Bovine pericardial patch angioplasty of right basilic vein  SURGEON: Judeth Cornfield. Scot Dock, MD, FACS  ASSIST: Leontine Locket, PA  ANESTHESIA: Gen.   EBL: minimal  INDICATIONS: Destiny Day is a 64 y.o. female who is not yet on dialysis. She presents for a second stage basilic vein transposition.  FINDINGS: there was marked intimal hyperplasia of the proximal basilic vein beyond the anastomosis. This was patched with a bovine pericardial patch.  TECHNIQUE: The patient was taken to the operating room and received a general anesthetic. The right upper extremity was prepped and draped in usual sterile fashion. Using 2 incisions along the medial aspect of the right upper arm the basilic vein was harvested up to the axilla and fully mobilized. There was one large connecting perforating vein which connected with the deep system which was very short and required suture ligation of the basilic vein side and ligation with a 2-0 silk of the brachial vein side. In addition there was a nerve overlying the vein and therefore elected to transpose the vein on top of the nerve by dividing it distally. After the vein had been mobilized, the patient was heparinized. The area of scar tissue and small hyperplasia was divided. The vein was then brought on top of the nerve. Then spatulated the vein in the area of minimal hyperplasia and sewed the back wall together using 2 continuous 60 proline sutures. This was then patched with a bovine pericardial patch which was sewn with continuous 60 proline suture. At the completion was a good thrill in the fistula. I then created a pocket superficially and the vein was placed within the pocket and then closed into the pocket. The wounds  were irrigated and hemostasis obtained. The wounds were closed with 3-0 Vicryl. The skin was closed with a 4-0 subcuticular stitch using Vicryl. Dermabond was applied. The patient tolerated the procedure well and transferred to the recovery room in stable condition. All needle and sponge counts were correct.  [name], MD, FACS [facility]  DATE OF DICTATION:   01/18/2015

## 2015-01-18 NOTE — Anesthesia Postprocedure Evaluation (Signed)
Anesthesia Post Note  Patient: Destiny Day  Procedure(s) Performed: Procedure(s) (LRB): SECOND STAGE BASILIC VEIN TRANSPOSITION (Right) BASILIC VEIN PATCH ANGIOPLASTY USING VASCUGUARD PATCH (Right)  Anesthesia type: General  Patient location: PACU  Post pain: Pain level controlled  Post assessment: Post-op Vital signs reviewed  Last Vitals: BP 92/60 mmHg  Pulse 69  Temp(Src) 36.7 C (Oral)  Resp 17  Ht 5\' 5"  (1.651 m)  Wt 141 lb 6 oz (64.127 kg)  BMI 23.53 kg/m2  SpO2 95%  Post vital signs: Reviewed  Level of consciousness: sedated  Complications: No apparent anesthesia complications

## 2015-01-18 NOTE — Transfer of Care (Signed)
Immediate Anesthesia Transfer of Care Note  Patient: Destiny Day  Procedure(s) Performed: Procedure(s): SECOND STAGE BASILIC VEIN TRANSPOSITION (Right) BASILIC VEIN PATCH ANGIOPLASTY USING VASCUGUARD PATCH (Right)  Patient Location: PACU  Anesthesia Type:General  Level of Consciousness: awake, alert  and oriented  Airway & Oxygen Therapy: Patient Spontanous Breathing and Patient connected to nasal cannula oxygen  Post-op Assessment: Report given to RN, Post -op Vital signs reviewed and stable and Patient moving all extremities X 4  Post vital signs: Reviewed and stable  Last Vitals:  Filed Vitals:   01/18/15 0554  BP: 129/53  Pulse: 72  Temp: 36.5 C  Resp: 18    Complications: No apparent anesthesia complications

## 2015-01-18 NOTE — Discharge Instructions (Signed)
° ° °  01/18/2015 Destiny Day [id number] 1951-03-27  Surgeon(s): Angelia Mould, MD  Procedure(s): SECOND STAGE BASILIC VEIN TRANSPOSITION-right BASILIC VEIN PATCH ANGIOPLASTY USING VASCUGUARD PATCH  x Do not stick fistula for 12 weeks

## 2015-01-18 NOTE — Interval H&P Note (Signed)
History and Physical Interval Note:  01/18/2015 9:55 AM  [name]  has presented today for surgery, with the diagnosis of Stage IV Chronic Kidney Disease N18.4  The various methods of treatment have been discussed with the patient and family. After consideration of risks, benefits and other options for treatment, the patient has consented to  Procedure(s): SECOND STAGE BASILIC VEIN TRANSPOSITION (Right) as a surgical intervention .  The patient's history has been reviewed, patient examined, no change in status, stable for surgery.  I have reviewed the patient's chart and labs.  Questions were answered to the patient's satisfaction.     [name]

## 2015-01-18 NOTE — Anesthesia Procedure Notes (Signed)
Procedure Name: LMA Insertion Date/Time: 01/18/2015 10:32 AM Performed by: Merrilyn Puma B Pre-anesthesia Checklist: Timeout performed, Patient identified, Emergency Drugs available, Suction available and Patient being monitored Patient Re-evaluated:Patient Re-evaluated prior to inductionOxygen Delivery Method: Circle system utilized Preoxygenation: Pre-oxygenation with 100% oxygen Intubation Type: IV induction Ventilation: Mask ventilation without difficulty LMA: LMA inserted LMA Size: 4.0 Placement Confirmation: positive ETCO2 and breath sounds checked- equal and bilateral Dental Injury: Teeth and Oropharynx as per pre-operative assessment

## 2015-01-18 NOTE — Progress Notes (Signed)
Pt has positive bruit & thrill to RUE upon adm.  &  DC PACU

## 2015-01-18 NOTE — Anesthesia Preprocedure Evaluation (Addendum)
Anesthesia Evaluation  Patient identified by MRN, date of birth, ID band Patient awake    Reviewed: Allergy & Precautions, NPO status , Patient's Chart, lab work & pertinent test results, reviewed documented beta blocker date and time   History of Anesthesia Complications Negative for: history of anesthetic complications  Airway Mallampati: II  TM Distance: >3 FB Neck ROM: Full    Dental no notable dental hx. (+) Dental Advisory Given, Edentulous Upper, Partial Lower, Poor Dentition   Pulmonary asthma , sleep apnea , pneumonia -, former smoker,  breath sounds clear to auscultation  Pulmonary exam normal       Cardiovascular hypertension, Pt. on medications and Pt. on home beta blockers +CHF Normal cardiovascular examRhythm:Regular Rate:Normal  08/2014 - echo - - Left ventricle: The cavity size was normal. There was moderate concentric hypertrophy. Systolic function was normal. The estimated ejection fraction was in the range of 50% to 55%. Wall motion was normal; there were no regional wall motion abnormalities. Doppler parameters are consistent with arestrictive pattern, indicative of decreased left ventricular diastolic compliance and/or increased left atrial pressure (grade 3 diastolic dysfunction). Doppler parameters are consistent withboth elevated ventricular end-diastolic filling pressure and elevated left atrial filling pressure. - Left atrium: The atrium was mildly dilated. - Atrial septum: No defect or patent foramen ovale was identified.   Neuro/Psych Anxiety    GI/Hepatic GERD-  Medicated,  Endo/Other  diabetes, Type 2, Insulin Dependent  Renal/GU ESRFRenal disease     Musculoskeletal  (+) Arthritis -,   Abdominal   Peds  Hematology  (+) anemia ,   Anesthesia Other Findings   Reproductive/Obstetrics                            Anesthesia Physical  Anesthesia Plan  ASA:  III  Anesthesia Plan: General   Post-op Pain Management:    Induction: Intravenous  Airway Management Planned: LMA  Additional Equipment: None  Intra-op Plan:   Post-operative Plan: Extubation in OR  Informed Consent: I have reviewed the patients History and Physical, chart, labs and discussed the procedure including the risks, benefits and alternatives for the proposed anesthesia with the patient or authorized representative who has indicated his/her understanding and acceptance.   Dental advisory given  Plan Discussed with: CRNA  Anesthesia Plan Comments:         Anesthesia Quick Evaluation

## 2015-01-18 NOTE — H&P (View-Only) (Signed)
Patient name: Destiny Day MRN: [id number] DOB: May 20, 1951 Sex: female  REASON FOR VISIT: follow up of AV fistula  HPI: Destiny Day is a 64 y.o. female who had a the basilic vein transposition in the left arm which failed. Most recently she had a first stage right basilic vein transposition on 11/08/2014. At the time of surgery she was noted to have a 3.5 mm right basilic vein. She comes in for his 6 week follow up visit.  She has no specific complaints except for some mild tenderness where the medial aspect of right arm rubs against her chest.  Current Outpatient Prescriptions  Medication Sig Dispense Refill  . amLODipine (NORVASC) 10 MG tablet Take 1 tablet (10 mg total) by mouth daily. 30 tablet 0  . aspirin 81 MG chewable tablet Chew 81 mg by mouth daily.    Marland Kitchen atorvastatin (LIPITOR) 20 MG tablet Take 20 mg by mouth daily at 6 PM.    . budesonide-formoterol (SYMBICORT) 160-4.5 MCG/ACT inhaler Inhale 1 puff into the lungs every evening.    . calcitRIOL (ROCALTROL) 0.25 MCG capsule Take 0.25 mcg by mouth daily.    . carvedilol (COREG) 25 MG tablet Take 1 tablet (25 mg total) by mouth 2 (two) times daily with a meal. 60 tablet 1  . cloNIDine (CATAPRES) 0.1 MG tablet Take 1 tablet (0.1 mg total) by mouth 3 (three) times daily. 90 tablet 2  . famotidine (PEPCID) 20 MG tablet Take 20 mg by mouth daily.    Marland Kitchen guaiFENesin (ROBITUSSIN) 100 MG/5ML liquid Take 5-10 mLs (100-200 mg total) by mouth every 4 (four) hours as needed for cough. 60 mL 0  . insulin aspart (NOVOLOG) 100 UNIT/ML injection Inject 0-8 Units into the skin 3 (three) times daily before meals. Sliding scale    . insulin detemir (LEVEMIR) 100 UNIT/ML injection Inject 10 Units into the skin at bedtime.    . isosorbide-hydrALAZINE (BIDIL) 20-37.5 MG per tablet Take 2 tablets by mouth 3 (three) times daily. 180 tablet 1  . metolazone (ZAROXOLYN) 5 MG tablet Take 5 mg by mouth daily as needed.    Marland Kitchen oxyCODONE (ROXICODONE) 5  MG immediate release tablet Take 1 tablet (5 mg total) by mouth every 6 (six) hours as needed. 20 tablet 0  . potassium chloride SA (K-DUR,KLOR-CON) 20 MEQ tablet Take 1 tablet (20 mEq total) by mouth 2 (two) times daily. 20 tablet 0  . torsemide (DEMADEX) 100 MG tablet Take 1 tablet (100 mg total) by mouth daily. 30 tablet 2  . Vitamin D, Ergocalciferol, (DRISDOL) 50000 UNITS CAPS capsule Take 50,000 Units by mouth every 7 (seven) days. On wednesday     No current facility-administered medications for this visit.   REVIEW OF SYSTEMS: Valu.Nieves ] denotes positive finding; [  ] denotes negative finding  CARDIOVASCULAR:  [ ]  chest pain   [ ]  dyspnea on exertion    CONSTITUTIONAL:  [ ]  fever   [ ]  chills  PHYSICAL EXAM: Filed Vitals:   12/26/14 1409  BP: 136/70  Pulse: 80  Temp: 97.9 F (36.6 C)  TempSrc: Oral  Resp: 16  Height: 5\' 5"  (1.651 m)  Weight: 140 lb (63.504 kg)  SpO2: 98%   GENERAL: The patient is a well-nourished female, in no acute distress. The vital signs are documented above. CARDIOVASCULAR: There is a regular rate and rhythm. PULMONARY: There is good air exchange bilaterally without wheezing or rales. Her basilic vein transposition has a good thrill.  DUPLEX  RIGHT AV FISTULA: The fistula in the right arm is patent with diameters ranging from 0.40 cm-0.73 cm. There is some elevated velocities at the anastomosis noted but no other significant problems noted.  MEDICAL ISSUES: STAGE IV CHRONIC KIDNEY DISEASE: Her basilic vein appears to be maturing adequately. For this reason we will proceed with a second stage to her basilic vein transposition. This has been scheduled for 01/18/2015. I have discussed the procedure and potential claudications with her and she is agreeable to proceed.  [name] [facility] [place]: [phone]

## 2015-01-21 ENCOUNTER — Telehealth: Payer: Self-pay | Admitting: Vascular Surgery

## 2015-01-21 ENCOUNTER — Encounter (HOSPITAL_COMMUNITY): Payer: Self-pay | Admitting: Vascular Surgery

## 2015-01-21 NOTE — Telephone Encounter (Addendum)
-----   Message from Mena Goes, RN sent at 01/18/2015  1:08 PM EDT ----- Regarding: Schedule   ----- Message -----    From: Gabriel Earing, PA-C    Sent: 01/18/2015  12:44 PM      To: [facility] Charge Pool  S/p SECOND STAGE BASILIC VEIN TRANSPOSITION BASILIC VEIN PATCH ANGIOPLASTY 01/18/15.  F/u with Dr. Scot Dock in 6 weeks with duplex.  Thanks, Aldona Bar  01/21/15: spoke with pt, dm

## 2015-01-24 ENCOUNTER — Encounter (HOSPITAL_COMMUNITY)
Admission: RE | Admit: 2015-01-24 | Discharge: 2015-01-24 | Disposition: A | Discharge status: Home / Self Care | Payer: BC Managed Care – PPO | Source: Ambulatory Visit | Attending: Nephrology | Admitting: Nephrology

## 2015-01-24 DIAGNOSIS — N184 Chronic kidney disease, stage 4 (severe): Secondary | ICD-10-CM | POA: Diagnosis not present

## 2015-01-24 LAB — IRON AND TIBC
Iron: 64 ug/dL (ref 28–170)
Saturation Ratios: 24 % (ref 10.4–31.8)
TIBC: 270 ug/dL (ref 250–450)
UIBC: 206 ug/dL

## 2015-01-24 LAB — FERRITIN: Ferritin: 247 ng/mL (ref 11–307)

## 2015-01-24 LAB — POCT HEMOGLOBIN-HEMACUE: Hemoglobin: 11 g/dL — ABNORMAL LOW (ref 12.0–15.0)

## 2015-01-24 MED ORDER — DARBEPOETIN ALFA 100 MCG/0.5ML IJ SOSY
100.0000 ug | PREFILLED_SYRINGE | INTRAMUSCULAR | Status: DC
  Administered 2015-01-24: 100 ug via SUBCUTANEOUS

## 2015-01-24 MED ORDER — DARBEPOETIN ALFA 100 MCG/0.5ML IJ SOSY
PREFILLED_SYRINGE | INTRAMUSCULAR | Status: AC
  Filled 2015-01-24: qty 0.5

## 2015-02-03 ENCOUNTER — Emergency Department (HOSPITAL_COMMUNITY): Payer: BC Managed Care – PPO

## 2015-02-03 ENCOUNTER — Encounter (HOSPITAL_COMMUNITY): Payer: Self-pay | Admitting: *Deleted

## 2015-02-03 ENCOUNTER — Emergency Department (HOSPITAL_COMMUNITY)
Admission: EM | Admit: 2015-02-03 | Discharge: 2015-02-03 | Disposition: A | Discharge status: Home / Self Care | Payer: BC Managed Care – PPO | Attending: Emergency Medicine | Admitting: Emergency Medicine

## 2015-02-03 DIAGNOSIS — R05 Cough: Secondary | ICD-10-CM | POA: Diagnosis not present

## 2015-02-03 DIAGNOSIS — Z79899 Other long term (current) drug therapy: Secondary | ICD-10-CM | POA: Insufficient documentation

## 2015-02-03 DIAGNOSIS — J45909 Unspecified asthma, uncomplicated: Secondary | ICD-10-CM | POA: Insufficient documentation

## 2015-02-03 DIAGNOSIS — K219 Gastro-esophageal reflux disease without esophagitis: Secondary | ICD-10-CM | POA: Insufficient documentation

## 2015-02-03 DIAGNOSIS — E119 Type 2 diabetes mellitus without complications: Secondary | ICD-10-CM | POA: Insufficient documentation

## 2015-02-03 DIAGNOSIS — J45901 Unspecified asthma with (acute) exacerbation: Secondary | ICD-10-CM | POA: Insufficient documentation

## 2015-02-03 DIAGNOSIS — I509 Heart failure, unspecified: Secondary | ICD-10-CM | POA: Insufficient documentation

## 2015-02-03 DIAGNOSIS — Z794 Long term (current) use of insulin: Secondary | ICD-10-CM | POA: Insufficient documentation

## 2015-02-03 DIAGNOSIS — M179 Osteoarthritis of knee, unspecified: Secondary | ICD-10-CM | POA: Diagnosis not present

## 2015-02-03 DIAGNOSIS — Z87891 Personal history of nicotine dependence: Secondary | ICD-10-CM | POA: Insufficient documentation

## 2015-02-03 DIAGNOSIS — F419 Anxiety disorder, unspecified: Secondary | ICD-10-CM | POA: Insufficient documentation

## 2015-02-03 DIAGNOSIS — I129 Hypertensive chronic kidney disease with stage 1 through stage 4 chronic kidney disease, or unspecified chronic kidney disease: Secondary | ICD-10-CM | POA: Insufficient documentation

## 2015-02-03 DIAGNOSIS — R059 Cough, unspecified: Secondary | ICD-10-CM

## 2015-02-03 DIAGNOSIS — R140 Abdominal distension (gaseous): Secondary | ICD-10-CM | POA: Insufficient documentation

## 2015-02-03 DIAGNOSIS — Z8701 Personal history of pneumonia (recurrent): Secondary | ICD-10-CM | POA: Diagnosis not present

## 2015-02-03 DIAGNOSIS — R0981 Nasal congestion: Secondary | ICD-10-CM | POA: Diagnosis not present

## 2015-02-03 DIAGNOSIS — N183 Chronic kidney disease, stage 3 (moderate): Secondary | ICD-10-CM | POA: Diagnosis not present

## 2015-02-03 DIAGNOSIS — E785 Hyperlipidemia, unspecified: Secondary | ICD-10-CM | POA: Diagnosis not present

## 2015-02-03 DIAGNOSIS — Z7982 Long term (current) use of aspirin: Secondary | ICD-10-CM | POA: Diagnosis not present

## 2015-02-03 DIAGNOSIS — Z8669 Personal history of other diseases of the nervous system and sense organs: Secondary | ICD-10-CM | POA: Insufficient documentation

## 2015-02-03 DIAGNOSIS — D649 Anemia, unspecified: Secondary | ICD-10-CM | POA: Diagnosis not present

## 2015-02-03 LAB — BASIC METABOLIC PANEL
Anion gap: 10 (ref 5–15)
BUN: 47 mg/dL — ABNORMAL HIGH (ref 6–20)
CO2: 27 mmol/L (ref 22–32)
Calcium: 9.1 mg/dL (ref 8.9–10.3)
Chloride: 98 mmol/L — ABNORMAL LOW (ref 101–111)
Creatinine, Ser: 3.12 mg/dL — ABNORMAL HIGH (ref 0.44–1.00)
GFR calc Af Amer: 17 mL/min — ABNORMAL LOW (ref >60)
GFR calc non Af Amer: 15 mL/min — ABNORMAL LOW (ref >60)
Glucose, Bld: 194 mg/dL — ABNORMAL HIGH (ref 65–99)
Potassium: 3.4 mmol/L — ABNORMAL LOW (ref 3.5–5.1)
Sodium: 135 mmol/L (ref 135–145)

## 2015-02-03 LAB — BRAIN NATRIURETIC PEPTIDE: B Natriuretic Peptide: 895.3 pg/mL — ABNORMAL HIGH (ref 0.0–100.0)

## 2015-02-03 LAB — CBC
HCT: 34.7 % — ABNORMAL LOW (ref 36.0–46.0)
Hemoglobin: 11.5 g/dL — ABNORMAL LOW (ref 12.0–15.0)
MCH: 29.9 pg (ref 26.0–34.0)
MCHC: 33.1 g/dL (ref 30.0–36.0)
MCV: 90.4 fL (ref 78.0–100.0)
Platelets: 255 10*3/uL (ref 150–400)
RBC: 3.84 MIL/uL — ABNORMAL LOW (ref 3.87–5.11)
RDW: 14.7 % (ref 11.5–15.5)
WBC: 8.6 10*3/uL (ref 4.0–10.5)

## 2015-02-03 LAB — I-STAT TROPONIN, ED: Troponin i, poc: 0 ng/mL (ref 0.00–0.08)

## 2015-02-03 MED ORDER — FUROSEMIDE 10 MG/ML IJ SOLN
100.0000 mg | Freq: Once | INTRAMUSCULAR | Status: AC
  Administered 2015-02-03: 100 mg via INTRAVENOUS
  Filled 2015-02-03: qty 10

## 2015-02-03 MED ORDER — HYDROCODONE-HOMATROPINE 5-1.5 MG/5ML PO SYRP: 5.0000 mL | ORAL_SOLUTION | Freq: Four times a day (QID) | ORAL | Status: DC | PRN

## 2015-02-03 MED ORDER — BENZONATATE 100 MG PO CAPS: 100.0000 mg | ORAL_CAPSULE | Freq: Three times a day (TID) | ORAL | Status: DC | PRN

## 2015-02-03 MED ORDER — METOLAZONE 5 MG PO TABS
5.0000 mg | ORAL_TABLET | ORAL | Status: AC
  Administered 2015-02-03: 5 mg via ORAL
  Filled 2015-02-03: qty 1

## 2015-02-03 NOTE — ED Notes (Signed)
Pt ambulated in hallway. O2 level stayed at 100%.

## 2015-02-03 NOTE — ED Notes (Signed)
Pt reports hx of chf and renal failure. Pt having sob and productive with white sputum, denies swelling to extremities. Airway intact at triage, ekg done.

## 2015-02-03 NOTE — Discharge Instructions (Signed)
Cough, Adult  A cough is a reflex that helps clear your throat and airways. It can help heal the body or may be a reaction to an irritated airway. A cough may only last 2 or 3 weeks (acute) or may last more than 8 weeks (chronic).  CAUSES Acute cough:  Viral or bacterial infections. Chronic cough:  Infections.  Allergies.  Asthma.  Post-nasal drip.  Smoking.  Heartburn or acid reflux.  Some medicines.  Chronic lung problems (COPD).  Cancer. SYMPTOMS   Cough.  Fever.  Chest pain.  Increased breathing rate.  High-pitched whistling sound when breathing (wheezing).  Colored mucus that you cough up (sputum). TREATMENT   A bacterial cough may be treated with antibiotic medicine.  A viral cough must run its course and will not respond to antibiotics.  Your caregiver may recommend other treatments if you have a chronic cough. HOME CARE INSTRUCTIONS   Only take over-the-counter or prescription medicines for pain, discomfort, or fever as directed by your caregiver. Use cough suppressants only as directed by your caregiver.  Use a cold steam vaporizer or humidifier in your bedroom or home to help loosen secretions.  Sleep in a semi-upright position if your cough is worse at night.  Rest as needed.  Stop smoking if you smoke. SEEK IMMEDIATE MEDICAL CARE IF:   You have pus in your sputum.  Your cough starts to worsen.  You cannot control your cough with suppressants and are losing sleep.  You begin coughing up blood.  You have difficulty breathing.  You develop pain which is getting worse or is uncontrolled with medicine.  You have a fever. MAKE SURE YOU:   Understand these instructions.  Will watch your condition.  Will get help right away if you are not doing well or get worse. Document Released: 01/09/2011 Document Revised: 10/05/2011 Document Reviewed: 01/09/2011 [facility] Patient Information 2015 [place], [place]. This information is not intended  to replace advice given to you by your health care provider. Make sure you discuss any questions you have with your health care provider.    Heart Failure Heart failure is a condition in which the heart has trouble pumping blood. This means your heart does not pump blood efficiently for your body to work well. In some cases of heart failure, fluid may back up into your lungs or you may have swelling (edema) in your lower legs. Heart failure is usually a long-term (chronic) condition. It is important for you to take good care of yourself and follow your health care provider's treatment plan. CAUSES  Some health conditions can cause heart failure. Those health conditions include:  High blood pressure (hypertension). Hypertension causes the heart muscle to work harder than normal. When pressure in the blood vessels is high, the heart needs to pump (contract) with more force in order to circulate blood throughout the body. High blood pressure eventually causes the heart to become stiff and weak.  Coronary artery disease (CAD). CAD is the buildup of cholesterol and fat (plaque) in the arteries of the heart. The blockage in the arteries deprives the heart muscle of oxygen and blood. This can cause chest pain and may lead to a heart attack. High blood pressure can also contribute to CAD.  Heart attack (myocardial infarction). A heart attack occurs when one or more arteries in the heart become blocked. The loss of oxygen damages the muscle tissue of the heart. When this happens, part of the heart muscle dies. The injured tissue does not  contract as well and weakens the heart's ability to pump blood.  Abnormal heart valves. When the heart valves do not open and close properly, it can cause heart failure. This makes the heart muscle pump harder to keep the blood flowing.  Heart muscle disease (cardiomyopathy or myocarditis). Heart muscle disease is damage to the heart muscle from a variety of causes. These  can include drug or alcohol abuse, infections, or unknown reasons. These can increase the risk of heart failure.  Lung disease. Lung disease makes the heart work harder because the lungs do not work properly. This can cause a strain on the heart, leading it to fail.  Diabetes. Diabetes increases the risk of heart failure. High blood sugar contributes to high fat (lipid) levels in the blood. Diabetes can also cause slow damage to tiny blood vessels that carry important nutrients to the heart muscle. When the heart does not get enough oxygen and food, it can cause the heart to become weak and stiff. This leads to a heart that does not contract efficiently.  Other conditions can contribute to heart failure. These include abnormal heart rhythms, thyroid problems, and low blood counts (anemia). Certain unhealthy behaviors can increase the risk of heart failure, including:  Being overweight.  Smoking or chewing tobacco.  Eating foods high in fat and cholesterol.  Abusing illicit drugs or alcohol.  Lacking physical activity. SYMPTOMS  Heart failure symptoms may vary and can be hard to detect. Symptoms may include:  Shortness of breath with activity, such as climbing stairs.  Persistent cough.  Swelling of the feet, ankles, legs, or abdomen.  Unexplained weight gain.  Difficulty breathing when lying flat (orthopnea).  Waking from sleep because of the need to sit up and get more air.  Rapid heartbeat.  Fatigue and loss of energy.  Feeling light-headed, dizzy, or close to fainting.  Loss of appetite.  Nausea.  Increased urination during the night (nocturia). DIAGNOSIS  A diagnosis of heart failure is based on your history, symptoms, physical examination, and diagnostic tests. Diagnostic tests for heart failure may include:  Echocardiography.  Electrocardiography.  Chest X-ray.  Blood tests.  Exercise stress test.  Cardiac angiography.  Radionuclide scans. TREATMENT    Treatment is aimed at managing the symptoms of heart failure. Medicines, behavioral changes, or surgical intervention may be necessary to treat heart failure.  Medicines to help treat heart failure may include:  Angiotensin-converting enzyme (ACE) inhibitors. This type of medicine blocks the effects of a blood protein called angiotensin-converting enzyme. ACE inhibitors relax (dilate) the blood vessels and help lower blood pressure.  Angiotensin receptor blockers (ARBs). This type of medicine blocks the actions of a blood protein called angiotensin. Angiotensin receptor blockers dilate the blood vessels and help lower blood pressure.  Water pills (diuretics). Diuretics cause the kidneys to remove salt and water from the blood. The extra fluid is removed through urination. This loss of extra fluid lowers the volume of blood the heart pumps.  Beta blockers. These prevent the heart from beating too fast and improve heart muscle strength.  Digitalis. This increases the force of the heartbeat.  Healthy behavior changes include:  Obtaining and maintaining a healthy weight.  Stopping smoking or chewing tobacco.  Eating heart-healthy foods.  Limiting or avoiding alcohol.  Stopping illicit drug use.  Physical activity as directed by your health care provider.  Surgical treatment for heart failure may include:  A procedure to open blocked arteries, repair damaged heart valves, or remove damaged heart  muscle tissue.  A pacemaker to improve heart muscle function and control certain abnormal heart rhythms.  An internal cardioverter defibrillator to treat certain serious abnormal heart rhythms.  A left ventricular assist device (LVAD) to assist the pumping ability of the heart. HOME CARE INSTRUCTIONS   Take medicines only as directed by your health care provider. Medicines are important in reducing the workload of your heart, slowing the progression of heart failure, and improving your  symptoms.  Do not stop taking your medicine unless directed by your health care provider.  Do not skip any dose of medicine.  Refill your prescriptions before you run out of medicine. Your medicines are needed every day.  Engage in moderate physical activity if directed by your health care provider. Moderate physical activity can benefit some people. The elderly and people with severe heart failure should consult with a health care provider for physical activity recommendations.  Eat heart-healthy foods. Food choices should be free of trans fat and low in saturated fat, cholesterol, and salt (sodium). Healthy choices include fresh or frozen fruits and vegetables, fish, lean meats, legumes, fat-free or low-fat dairy products, and whole grain or high fiber foods. Talk to a dietitian to learn more about heart-healthy foods.  Limit sodium if directed by your health care provider. Sodium restriction may reduce symptoms of heart failure in some people. Talk to a dietitian to learn more about heart-healthy seasonings.  Use healthy cooking methods. Healthy cooking methods include roasting, grilling, broiling, baking, poaching, steaming, or stir-frying. Talk to a dietitian to learn more about healthy cooking methods.  Limit fluids if directed by your health care provider. Fluid restriction may reduce symptoms of heart failure in some people.  Weigh yourself every day. Daily weights are important in the early recognition of excess fluid. You should weigh yourself every morning after you urinate and before you eat breakfast. Wear the same amount of clothing each time you weigh yourself. Record your daily weight. Provide your health care provider with your weight record.  Monitor and record your blood pressure if directed by your health care provider.  Check your pulse if directed by your health care provider.  Lose weight if directed by your health care provider. Weight loss may reduce symptoms of heart  failure in some people.  Stop smoking or chewing tobacco. Nicotine makes your heart work harder by causing your blood vessels to constrict. Do not use nicotine gum or patches before talking to your health care provider.  Keep all follow-up visits as directed by your health care provider. This is important.  Limit alcohol intake to no more than 1 drink per day for nonpregnant women and 2 drinks per day for men. One drink equals 12 ounces of beer, 5 ounces of wine, or 1 ounces of hard liquor. Drinking more than that is harmful to your heart. Tell your health care provider if you drink alcohol several times a week. Talk with your health care provider about whether alcohol is safe for you. If your heart has already been damaged by alcohol or you have severe heart failure, drinking alcohol should be stopped completely.  Stop illicit drug use.  Stay up-to-date with immunizations. It is especially important to prevent respiratory infections through current pneumococcal and influenza immunizations.  Manage other health conditions such as hypertension, diabetes, thyroid disease, or abnormal heart rhythms as directed by your health care provider.  Learn to manage stress.  Plan rest periods when fatigued.  Learn strategies to manage high temperatures.  If the weather is extremely hot:  Avoid vigorous physical activity.  Use air conditioning or fans or seek a cooler location.  Avoid caffeine and alcohol.  Wear loose-fitting, lightweight, and light-colored clothing.  Learn strategies to manage cold temperatures. If the weather is extremely cold:  Avoid vigorous physical activity.  Layer clothes.  Wear mittens or gloves, a hat, and a scarf when going outside.  Avoid alcohol.  Obtain ongoing education and support as needed.  Participate in or seek rehabilitation as needed to maintain or improve independence and quality of life. SEEK MEDICAL CARE IF:   Your weight increases by 03 lb/1.4 kg  in 1 day or 05 lb/2.3 kg in a week.  You have increasing shortness of breath that is unusual for you.  You are unable to participate in your usual physical activities.  You tire easily.  You cough more than normal, especially with physical activity.  You have any or more swelling in areas such as your hands, feet, ankles, or abdomen.  You are unable to sleep because it is hard to breathe.  You feel like your heart is beating fast (palpitations).  You become dizzy or light-headed upon standing up. SEEK IMMEDIATE MEDICAL CARE IF:   You have difficulty breathing.  There is a change in mental status such as decreased alertness or difficulty with concentration.  You have a pain or discomfort in your chest.  You have an episode of fainting (syncope). MAKE SURE YOU:   Understand these instructions.  Will watch your condition.  Will get help right away if you are not doing well or get worse. Document Released: 07/13/2005 Document Revised: 11/27/2013 Document Reviewed: 08/12/2012 [facility] Patient Information 2015 [place], [place]. This information is not intended to replace advice given to you by your health care provider. Make sure you discuss any questions you have with your health care provider.

## 2015-02-03 NOTE — ED Provider Notes (Signed)
CSN: [id number]     Arrival date & time 02/03/15  1157 History   First MD Initiated Contact with Patient 02/03/15 1257     Chief Complaint  Patient presents with  . Shortness of Breath  . Cough     (Consider location/radiation/quality/duration/timing/severity/associated sxs/prior Treatment) HPI  64 year old female with a history of diabetes, CHF, and chronic kidney disease that is nearing dialysis presents with cough and shortness of breath over the last couple days. The cough is productive of white sputum and she feels congestion in her chest. She also feels some upper abdominal swelling but denies leg swelling. The symptoms are consistent with multiple recurrent CHF exacerbations. She feels like this is early and went to get it taken care of before got worse. There is no chest pain. The patient denies any fevers. She states typically her swelling is abdominal as opposed to leg. She is on torsemide 100 mg daily and has been taking this as instructed.  Past Medical History  Diagnosis Date  . Hypertension   . Diverticulitis   . Hyperlipidemia   . Asthma   . Diabetes mellitus     insulin dependent  . Arthritis     knee  . GI bleed 03/31/2013  . CHF (congestive heart failure)   . Osteoporosis   . Seasonal allergies   . Anxiety   . Abdominal bruit   . Chronic kidney disease     stage 3  . Sleep apnea     can't afford cpap  . Pneumonia   . GERD (gastroesophageal reflux disease)     from medications  . Complication of anesthesia     " after I got home from my last procedure, I started itching."  . Anemia    Past Surgical History  Procedure Laterality Date  . Abdominal hysterectomy  1993`  . Esophagogastroduodenoscopy N/A 03/31/2013    Procedure: ESOPHAGOGASTRODUODENOSCOPY (EGD);  Surgeon: Gatha Mayer, MD;  Location: [facility] ENDOSCOPY;  Service: Endoscopy;  Laterality: N/A;  . Eye surgery      laser surgery  . Bascilic vein transposition Left 07/10/2014    Procedure: BASCILIC  VEIN TRANSPOSITION;  Surgeon: Angelia Mould, MD;  Location: [place];  Service: Vascular;  Laterality: Left;  . Fistulogram Left 10/29/2014    Procedure: FISTULOGRAM;  Surgeon: Angelia Mould, MD;  Location: [facility] CATH LAB;  Service: Cardiovascular;  Laterality: Left;  . Bascilic vein transposition Right 11/08/2014    Procedure: FIRST STAGE BASILIC VEIN TRANSPOSITION;  Surgeon: Angelia Mould, MD;  Location: [place];  Service: Vascular;  Laterality: Right;  . Bascilic vein transposition Right 01/18/2015    Procedure: SECOND STAGE BASILIC VEIN TRANSPOSITION;  Surgeon: Angelia Mould, MD;  Location: [facility] OR;  Service: Vascular;  Laterality: Right;  . Patch angioplasty Right 01/18/2015    Procedure: BASILIC VEIN PATCH ANGIOPLASTY USING VASCUGUARD PATCH;  Surgeon: Angelia Mould, MD;  Location: [facility] OR;  Service: Vascular;  Laterality: Right;   Family History  Problem Relation Age of Onset  . Colon cancer Neg Hx   . Other Mother     not sure of cause of death  . Diabetes Father   . Pancreatic cancer Maternal Grandmother    History  Substance Use Topics  . Smoking status: Former Smoker -- 0.35 packs/day for 40 years    Types: Cigarettes    Quit date: 05/10/2012  . Smokeless tobacco: Never Used  . Alcohol Use: No   OB History    No  data available     Review of Systems  Constitutional: Negative for fever.  HENT: Positive for congestion.   Respiratory: Positive for cough and shortness of breath.   Cardiovascular: Negative for chest pain and leg swelling.  Gastrointestinal: Positive for abdominal distention. Negative for abdominal pain.  All other systems reviewed and are negative.     Allergies  Lisinopril and Prednisone  Home Medications   Prior to Admission medications   Medication Sig Start Date End Date Taking? Authorizing Provider  albuterol (PROVENTIL HFA;VENTOLIN HFA) 108 (90 BASE) MCG/ACT inhaler Inhale 1-2 puffs into the lungs every 6 (six) hours as  needed for wheezing or shortness of breath.    Historical Provider, MD  amLODipine (NORVASC) 10 MG tablet Take 1 tablet (10 mg total) by mouth daily. 07/23/13   Velvet Bathe, MD  aspirin 81 MG chewable tablet Chew 81 mg by mouth daily.    Historical Provider, MD  atorvastatin (LIPITOR) 20 MG tablet Take 20 mg by mouth daily at 6 PM.    Historical Provider, MD  budesonide-formoterol (SYMBICORT) 160-4.5 MCG/ACT inhaler Inhale 1 puff into the lungs every evening.    Historical Provider, MD  calcitRIOL (ROCALTROL) 0.25 MCG capsule Take 0.25 mcg by mouth daily.    Historical Provider, MD  carvedilol (COREG) 25 MG tablet Take 1 tablet (25 mg total) by mouth 2 (two) times daily with a meal. 06/01/13   Costin Karlyne Greenspan, MD  cloNIDine (CATAPRES) 0.1 MG tablet Take 1 tablet (0.1 mg total) by mouth 3 (three) times daily. 09/05/14   Velna Hatchet, MD  ferrous sulfate 325 (65 FE) MG tablet Take 325 mg by mouth daily with breakfast.    Historical Provider, MD  insulin aspart (NOVOLOG) 100 UNIT/ML injection Inject 0-8 Units into the skin 3 (three) times daily before meals. Sliding scale    Historical Provider, MD  insulin detemir (LEVEMIR) 100 UNIT/ML injection Inject 10 Units into the skin at bedtime.    Historical Provider, MD  isosorbide-hydrALAZINE (BIDIL) 20-37.5 MG per tablet Take 2 tablets by mouth 3 (three) times daily. 01/12/14   Barton Dubois, MD  loratadine (CLARITIN) 10 MG tablet Take 10 mg by mouth daily as needed for allergies.    Historical Provider, MD  metolazone (ZAROXOLYN) 5 MG tablet Take 5 mg by mouth daily as needed. 08/08/14   Historical Provider, MD  omeprazole (PRILOSEC) 20 MG capsule Take 20 mg by mouth daily.    Historical Provider, MD  oxyCODONE (ROXICODONE) 5 MG immediate release tablet Take 1 tablet (5 mg total) by mouth every 6 (six) hours as needed. 01/18/15   Samantha J Rhyne, PA-C  potassium chloride SA (K-DUR,KLOR-CON) 20 MEQ tablet Take 1 tablet (20 mEq total) by mouth 2 (two) times  daily. 12/10/14   Linton Flemings, MD  torsemide (DEMADEX) 100 MG tablet Take 1 tablet (100 mg total) by mouth daily. 09/05/14   Velna Hatchet, MD  Vitamin D, Ergocalciferol, (DRISDOL) 50000 UNITS CAPS capsule Take 50,000 Units by mouth every 7 (seven) days. Mondays    Historical Provider, MD   BP 156/69 mmHg  Pulse 82  Temp(Src) 98.4 F (36.9 C) (Oral)  Resp 18  Ht 5\' 5"  (1.651 m)  Wt 144 lb (65.318 kg)  BMI 23.96 kg/m2  SpO2 100% Physical Exam  Constitutional: She is oriented to person, place, and time. She appears well-developed and well-nourished. No distress.  HENT:  Head: Normocephalic and atraumatic.  Right Ear: External ear normal.  Left Ear: External ear normal.  Nose: Nose normal.  Eyes: Right eye exhibits no discharge. Left eye exhibits no discharge.  Cardiovascular: Normal rate, regular rhythm and normal heart sounds.   Pulmonary/Chest: Effort normal and breath sounds normal. She has no wheezes. She has no rales.  Abdominal: Soft. She exhibits no distension. There is no tenderness.  Musculoskeletal: She exhibits no edema.  Neurological: She is alert and oriented to person, place, and time.  Skin: Skin is warm and dry. She is not diaphoretic.  Nursing note and vitals reviewed.   ED Course  Procedures (including critical care time) Labs Review Labs Reviewed  CBC - Abnormal; Notable for the following:    RBC 3.84 (*)    Hemoglobin 11.5 (*)    HCT 34.7 (*)    All other components within normal limits  BASIC METABOLIC PANEL - Abnormal; Notable for the following:    Potassium 3.4 (*)    Chloride 98 (*)    Glucose, Bld 194 (*)    BUN 47 (*)    Creatinine, Ser 3.12 (*)    GFR calc non Af Amer 15 (*)    GFR calc Af Amer 17 (*)    All other components within normal limits  BRAIN NATRIURETIC PEPTIDE - Abnormal; Notable for the following:    B Natriuretic Peptide 895.3 (*)    All other components within normal limits  I-STAT TROPOININ, ED    Imaging Review Dg Chest 2  View  02/03/2015   CLINICAL DATA:  Shortness of breath and productive cough for 1 week. Congestive heart failure. Renal failure.  EXAM: CHEST  2 VIEW  COMPARISON:  12/10/2014  FINDINGS: Mild cardiomegaly and pulmonary vascular congestion appears stable. No evidence of acute infiltrate or edema. No evidence of pleural effusion. No mass or lymphadenopathy identified.  IMPRESSION: Stable mild cardiomegaly and pulmonary vascular congestion. No acute findings.   Electronically Signed   By: Earle Gell M.D.   On: 02/03/2015 13:51     EKG Interpretation   Date/Time:  Sunday February 03 2015 12:01:29 EDT Ventricular Rate:  87 PR Interval:  156 QRS Duration: 86 QT Interval:  402 QTC Calculation: 483 R Axis:   45 Text Interpretation:  Normal sinus rhythm Septal infarct , age  undetermined Abnormal ECG no significant change since May 2016 Confirmed  by Regenia Skeeter  MD, Myishia Kasik [id number]) on 02/03/2015 12:58:31 PM      MDM   Final diagnoses:  Acute on chronic congestive heart failure, unspecified congestive heart failure type  Cough    Patient is well-appearing here, no increased work of breathing, hypoxia, and has clear lungs for me. Was ambulated with a pulse oximeter and had no hypoxia or increased work of breathing. Discussed with her cardiologist, Dr. Einar Gip, who recommends 100 mg lasix IV and zaroxolyn 5 mg po now. No further change in meds, f/u with him this week. While she has had surgery recently have extremely low suspicion for pulmonary embolism given no tachycardia, hypoxia, and this is similar to multiple prior CHF exacerbations. Will for discharge home.     Sherwood Gambler, MD 02/03/15 [phone]

## 2015-02-03 NOTE — ED Notes (Signed)
Patient transported to X-ray 

## 2015-02-20 ENCOUNTER — Other Ambulatory Visit (HOSPITAL_COMMUNITY): Payer: Self-pay | Admitting: *Deleted

## 2015-02-21 ENCOUNTER — Encounter (HOSPITAL_COMMUNITY)
Admission: RE | Admit: 2015-02-21 | Discharge: 2015-02-21 | Disposition: A | Discharge status: Home / Self Care | Payer: BC Managed Care – PPO | Source: Ambulatory Visit | Attending: Nephrology | Admitting: Nephrology

## 2015-02-21 DIAGNOSIS — Z5181 Encounter for therapeutic drug level monitoring: Secondary | ICD-10-CM | POA: Diagnosis not present

## 2015-02-21 DIAGNOSIS — N184 Chronic kidney disease, stage 4 (severe): Secondary | ICD-10-CM | POA: Insufficient documentation

## 2015-02-21 DIAGNOSIS — Z79899 Other long term (current) drug therapy: Secondary | ICD-10-CM | POA: Diagnosis not present

## 2015-02-21 DIAGNOSIS — D631 Anemia in chronic kidney disease: Secondary | ICD-10-CM | POA: Diagnosis not present

## 2015-02-21 LAB — POCT HEMOGLOBIN-HEMACUE: Hemoglobin: 10.8 g/dL — ABNORMAL LOW (ref 12.0–15.0)

## 2015-02-21 LAB — IRON AND TIBC
Iron: 35 ug/dL (ref 28–170)
Saturation Ratios: 14 % (ref 10.4–31.8)
TIBC: 246 ug/dL — ABNORMAL LOW (ref 250–450)
UIBC: 211 ug/dL

## 2015-02-21 LAB — FERRITIN: Ferritin: 191 ng/mL (ref 11–307)

## 2015-02-21 MED ORDER — DARBEPOETIN ALFA 100 MCG/0.5ML IJ SOSY
PREFILLED_SYRINGE | INTRAMUSCULAR | Status: AC
  Filled 2015-02-21: qty 0.5

## 2015-02-21 MED ORDER — DARBEPOETIN ALFA 100 MCG/0.5ML IJ SOSY
100.0000 ug | PREFILLED_SYRINGE | INTRAMUSCULAR | Status: DC
  Administered 2015-02-21: 100 ug via SUBCUTANEOUS

## 2015-02-25 ENCOUNTER — Encounter: Payer: Self-pay | Admitting: Vascular Surgery

## 2015-02-27 ENCOUNTER — Ambulatory Visit (INDEPENDENT_AMBULATORY_CARE_PROVIDER_SITE_OTHER): Payer: Self-pay | Admitting: Vascular Surgery

## 2015-02-27 ENCOUNTER — Encounter: Payer: Self-pay | Admitting: Vascular Surgery

## 2015-02-27 ENCOUNTER — Ambulatory Visit (HOSPITAL_COMMUNITY)
Admission: RE | Admit: 2015-02-27 | Discharge: 2015-02-27 | Disposition: A | Discharge status: Home / Self Care | Payer: BC Managed Care – PPO | Source: Ambulatory Visit | Attending: Vascular Surgery | Admitting: Vascular Surgery

## 2015-02-27 VITALS — BP 176/80 | HR 73 | Temp 98.2°F | Resp 14 | Ht 65.0 in | Wt 142.0 lb

## 2015-02-27 DIAGNOSIS — Z4931 Encounter for adequacy testing for hemodialysis: Secondary | ICD-10-CM | POA: Insufficient documentation

## 2015-02-27 DIAGNOSIS — N186 End stage renal disease: Secondary | ICD-10-CM

## 2015-02-27 NOTE — Progress Notes (Signed)
Patient name: Destiny Day MRN: [id number] DOB: Sep 22, 1950 Sex: female  REASON FOR VISIT: Follow up after second stage right basilic vein transposition.  HPI: Destiny Day is a 64 y.o. female who underwent a second stage basilic vein transposition on 01/18/2015. She comes in for follow up visit. She is not on dialysis. She has no specific complaints referral to her right upper extremity.  Current Outpatient Prescriptions  Medication Sig Dispense Refill  . albuterol (PROVENTIL HFA;VENTOLIN HFA) 108 (90 BASE) MCG/ACT inhaler Inhale 1-2 puffs into the lungs every 6 (six) hours as needed for wheezing or shortness of breath.    Marland Kitchen amLODipine (NORVASC) 10 MG tablet Take 1 tablet (10 mg total) by mouth daily. 30 tablet 0  . aspirin 81 MG chewable tablet Chew 81 mg by mouth daily.    Marland Kitchen atorvastatin (LIPITOR) 20 MG tablet Take 20 mg by mouth daily at 6 PM.    . benzonatate (TESSALON) 100 MG capsule Take 1 capsule (100 mg total) by mouth 3 (three) times daily as needed for cough. 21 capsule 0  . budesonide-formoterol (SYMBICORT) 160-4.5 MCG/ACT inhaler Inhale 1 puff into the lungs every evening.    . calcitRIOL (ROCALTROL) 0.25 MCG capsule Take 0.25 mcg by mouth daily.    . carvedilol (COREG) 25 MG tablet Take 1 tablet (25 mg total) by mouth 2 (two) times daily with a meal. 60 tablet 1  . cloNIDine (CATAPRES) 0.1 MG tablet Take 1 tablet (0.1 mg total) by mouth 3 (three) times daily. 90 tablet 2  . ferrous sulfate 325 (65 FE) MG tablet Take 325 mg by mouth daily with breakfast.    . HYDROcodone-homatropine (HYCODAN) 5-1.5 MG/5ML syrup Take 5 mLs by mouth every 6 (six) hours as needed for cough. 120 mL 0  . insulin aspart (NOVOLOG) 100 UNIT/ML injection Inject 0-8 Units into the skin 3 (three) times daily before meals. Sliding scale    . isosorbide-hydrALAZINE (BIDIL) 20-37.5 MG per tablet Take 2 tablets by mouth 3 (three) times daily. 180 tablet 1  . LEVEMIR FLEXTOUCH 100 UNIT/ML Pen Inject  10 Units into the muscle every evening.    . loratadine (CLARITIN) 10 MG tablet Take 10 mg by mouth daily as needed for allergies.    . metolazone (ZAROXOLYN) 5 MG tablet Take 5 mg by mouth daily as needed.    . multivitamin (RENA-VIT) TABS tablet Take 1 tablet by mouth daily.  5  . omeprazole (PRILOSEC) 20 MG capsule Take 20 mg by mouth daily.    Marland Kitchen oxyCODONE (ROXICODONE) 5 MG immediate release tablet Take 1 tablet (5 mg total) by mouth every 6 (six) hours as needed. (Patient taking differently: Take 5 mg by mouth every 6 (six) hours as needed for moderate pain. ) 20 tablet 0  . potassium chloride SA (K-DUR,KLOR-CON) 20 MEQ tablet Take 1 tablet (20 mEq total) by mouth 2 (two) times daily. 20 tablet 0  . torsemide (DEMADEX) 100 MG tablet Take 1 tablet (100 mg total) by mouth daily. 30 tablet 2  . Vitamin D, Ergocalciferol, (DRISDOL) 50000 UNITS CAPS capsule Take 50,000 Units by mouth every 7 (seven) days. Mondays     No current facility-administered medications for this visit.   REVIEW OF SYSTEMS: Valu.Nieves ] denotes positive finding; [  ] denotes negative finding  CARDIOVASCULAR:  [ ]  chest pain   [ ]  dyspnea on exertion    CONSTITUTIONAL:  [ ]  fever   [ ]  chills  PHYSICAL EXAM: Filed Vitals:  02/27/15 1135 02/27/15 1137  BP: 180/70 176/80  Pulse: 72 73  Temp: 98.2 F (36.8 C)   TempSrc: Oral   Resp: 14   Height: 5\' 5"  (1.651 m)   Weight: 142 lb (64.411 kg)   SpO2: 98%    GENERAL: The patient is a well-nourished female, in no acute distress. The vital signs are documented above. CARDIOVASCULAR: There is a regular rate and rhythm. PULMONARY: There is good air exchange bilaterally without wheezing or rales. Her incisions are healing nicely. She has an excellent thrill in her fistula. She has a palpable right radial pulse.  DIALYSIS FISTULA DUPLEX: There is no significant narrowing noted in her fistula. The diameters range from 0.62-1.1 cm. The depths ranged from 0.48-0.58 cm.  MEDICAL  ISSUES:  HER BASILIC VEIN TRANSPOSITION IN THE RIGHT APPEARS TO BE MATURING NICELY AND SHOULD BE USABLE FOR ACCESS IF IT IS NEEDED. I WILL SEE HER BACK AS NEEDED.  [name] [facility]: [phone]

## 2015-02-28 ENCOUNTER — Other Ambulatory Visit (HOSPITAL_COMMUNITY): Payer: Self-pay | Admitting: *Deleted

## 2015-03-01 ENCOUNTER — Ambulatory Visit (HOSPITAL_COMMUNITY)
Admission: RE | Admit: 2015-03-01 | Discharge: 2015-03-01 | Disposition: A | Discharge status: Home / Self Care | Payer: BC Managed Care – PPO | Source: Ambulatory Visit | Attending: Nephrology | Admitting: Nephrology

## 2015-03-01 DIAGNOSIS — D509 Iron deficiency anemia, unspecified: Secondary | ICD-10-CM | POA: Diagnosis not present

## 2015-03-01 MED ORDER — FERUMOXYTOL INJECTION 510 MG/17 ML
510.0000 mg | Freq: Once | INTRAVENOUS | Status: AC
  Administered 2015-03-01: 510 mg via INTRAVENOUS
  Filled 2015-03-01: qty 17

## 2015-03-15 ENCOUNTER — Inpatient Hospital Stay (HOSPITAL_COMMUNITY)
Admission: EM | Admit: 2015-03-15 | Discharge: 2015-03-17 | DRG: 292 | Disposition: A | Discharge status: Home / Self Care | Payer: BC Managed Care – PPO | Attending: Internal Medicine | Admitting: Internal Medicine

## 2015-03-15 ENCOUNTER — Emergency Department (HOSPITAL_COMMUNITY): Payer: BC Managed Care – PPO

## 2015-03-15 ENCOUNTER — Encounter (HOSPITAL_COMMUNITY): Payer: Self-pay

## 2015-03-15 DIAGNOSIS — M810 Age-related osteoporosis without current pathological fracture: Secondary | ICD-10-CM | POA: Diagnosis present

## 2015-03-15 DIAGNOSIS — N184 Chronic kidney disease, stage 4 (severe): Secondary | ICD-10-CM | POA: Diagnosis present

## 2015-03-15 DIAGNOSIS — R590 Localized enlarged lymph nodes: Secondary | ICD-10-CM | POA: Diagnosis present

## 2015-03-15 DIAGNOSIS — I5033 Acute on chronic diastolic (congestive) heart failure: Secondary | ICD-10-CM | POA: Diagnosis present

## 2015-03-15 DIAGNOSIS — K219 Gastro-esophageal reflux disease without esophagitis: Secondary | ICD-10-CM | POA: Diagnosis present

## 2015-03-15 DIAGNOSIS — Z7951 Long term (current) use of inhaled steroids: Secondary | ICD-10-CM

## 2015-03-15 DIAGNOSIS — K5792 Diverticulitis of intestine, part unspecified, without perforation or abscess without bleeding: Secondary | ICD-10-CM | POA: Diagnosis present

## 2015-03-15 DIAGNOSIS — Z87891 Personal history of nicotine dependence: Secondary | ICD-10-CM | POA: Diagnosis not present

## 2015-03-15 DIAGNOSIS — Z794 Long term (current) use of insulin: Secondary | ICD-10-CM

## 2015-03-15 DIAGNOSIS — J45909 Unspecified asthma, uncomplicated: Secondary | ICD-10-CM | POA: Diagnosis present

## 2015-03-15 DIAGNOSIS — Z888 Allergy status to other drugs, medicaments and biological substances status: Secondary | ICD-10-CM

## 2015-03-15 DIAGNOSIS — M1990 Unspecified osteoarthritis, unspecified site: Secondary | ICD-10-CM | POA: Diagnosis present

## 2015-03-15 DIAGNOSIS — R0602 Shortness of breath: Secondary | ICD-10-CM

## 2015-03-15 DIAGNOSIS — I5030 Unspecified diastolic (congestive) heart failure: Secondary | ICD-10-CM

## 2015-03-15 DIAGNOSIS — N185 Chronic kidney disease, stage 5: Secondary | ICD-10-CM | POA: Diagnosis present

## 2015-03-15 DIAGNOSIS — F419 Anxiety disorder, unspecified: Secondary | ICD-10-CM | POA: Diagnosis present

## 2015-03-15 DIAGNOSIS — Z7982 Long term (current) use of aspirin: Secondary | ICD-10-CM | POA: Diagnosis not present

## 2015-03-15 DIAGNOSIS — R1110 Vomiting, unspecified: Secondary | ICD-10-CM | POA: Diagnosis present

## 2015-03-15 DIAGNOSIS — I120 Hypertensive chronic kidney disease with stage 5 chronic kidney disease or end stage renal disease: Secondary | ICD-10-CM | POA: Diagnosis present

## 2015-03-15 DIAGNOSIS — G4730 Sleep apnea, unspecified: Secondary | ICD-10-CM | POA: Diagnosis present

## 2015-03-15 DIAGNOSIS — E1121 Type 2 diabetes mellitus with diabetic nephropathy: Secondary | ICD-10-CM | POA: Diagnosis present

## 2015-03-15 DIAGNOSIS — I10 Essential (primary) hypertension: Secondary | ICD-10-CM | POA: Diagnosis not present

## 2015-03-15 DIAGNOSIS — E785 Hyperlipidemia, unspecified: Secondary | ICD-10-CM | POA: Diagnosis present

## 2015-03-15 DIAGNOSIS — E1122 Type 2 diabetes mellitus with diabetic chronic kidney disease: Secondary | ICD-10-CM | POA: Diagnosis present

## 2015-03-15 DIAGNOSIS — Z79899 Other long term (current) drug therapy: Secondary | ICD-10-CM

## 2015-03-15 DIAGNOSIS — R112 Nausea with vomiting, unspecified: Secondary | ICD-10-CM | POA: Diagnosis present

## 2015-03-15 DIAGNOSIS — I509 Heart failure, unspecified: Secondary | ICD-10-CM | POA: Diagnosis not present

## 2015-03-15 LAB — CBC WITH DIFFERENTIAL/PLATELET
Basophils Absolute: 0 10*3/uL (ref 0.0–0.1)
Basophils Absolute: 0 10*3/uL (ref 0.0–0.1)
Basophils Relative: 1 % (ref 0–1)
Basophils Relative: 0 % (ref 0–1)
Eosinophils Absolute: 0.3 10*3/uL (ref 0.0–0.7)
Eosinophils Absolute: 0.1 10*3/uL (ref 0.0–0.7)
Eosinophils Relative: 4 % (ref 0–5)
Eosinophils Relative: 2 % (ref 0–5)
HCT: 40.1 % (ref 36.0–46.0)
HCT: 34.1 % — ABNORMAL LOW (ref 36.0–46.0)
Hemoglobin: 13.5 g/dL (ref 12.0–15.0)
Hemoglobin: 11.3 g/dL — ABNORMAL LOW (ref 12.0–15.0)
Lymphocytes Relative: 40 % (ref 12–46)
Lymphocytes Relative: 24 % (ref 12–46)
Lymphs Abs: 2.6 10*3/uL (ref 0.7–4.0)
Lymphs Abs: 1.8 10*3/uL (ref 0.7–4.0)
MCH: 30.7 pg (ref 26.0–34.0)
MCH: 30.5 pg (ref 26.0–34.0)
MCHC: 33.7 g/dL (ref 30.0–36.0)
MCHC: 33.1 g/dL (ref 30.0–36.0)
MCV: 91.1 fL (ref 78.0–100.0)
MCV: 92.2 fL (ref 78.0–100.0)
Monocytes Absolute: 0.3 10*3/uL (ref 0.1–1.0)
Monocytes Absolute: 0.4 10*3/uL (ref 0.1–1.0)
Monocytes Relative: 4 % (ref 3–12)
Monocytes Relative: 5 % (ref 3–12)
Neutro Abs: 3.3 10*3/uL (ref 1.7–7.7)
Neutro Abs: 5.2 10*3/uL (ref 1.7–7.7)
Neutrophils Relative %: 51 % (ref 43–77)
Neutrophils Relative %: 69 % (ref 43–77)
Platelets: 294 10*3/uL (ref 150–400)
Platelets: 251 10*3/uL (ref 150–400)
RBC: 4.4 MIL/uL (ref 3.87–5.11)
RBC: 3.7 MIL/uL — ABNORMAL LOW (ref 3.87–5.11)
RDW: 14.3 % (ref 11.5–15.5)
RDW: 14.5 % (ref 11.5–15.5)
WBC: 6.4 10*3/uL (ref 4.0–10.5)
WBC: 7.5 10*3/uL (ref 4.0–10.5)

## 2015-03-15 LAB — GLUCOSE, CAPILLARY
Glucose-Capillary: 186 mg/dL — ABNORMAL HIGH (ref 65–99)
Glucose-Capillary: 206 mg/dL — ABNORMAL HIGH (ref 65–99)
Glucose-Capillary: 128 mg/dL — ABNORMAL HIGH (ref 65–99)
Glucose-Capillary: 204 mg/dL — ABNORMAL HIGH (ref 65–99)

## 2015-03-15 LAB — COMPREHENSIVE METABOLIC PANEL
ALT: 35 U/L (ref 14–54)
ALT: 28 U/L (ref 14–54)
AST: 37 U/L (ref 15–41)
AST: 29 U/L (ref 15–41)
Albumin: 3.5 g/dL (ref 3.5–5.0)
Albumin: 2.8 g/dL — ABNORMAL LOW (ref 3.5–5.0)
Alkaline Phosphatase: 204 U/L — ABNORMAL HIGH (ref 38–126)
Alkaline Phosphatase: 159 U/L — ABNORMAL HIGH (ref 38–126)
Anion gap: 15 (ref 5–15)
Anion gap: 11 (ref 5–15)
BUN: 50 mg/dL — ABNORMAL HIGH (ref 6–20)
BUN: 52 mg/dL — ABNORMAL HIGH (ref 6–20)
CO2: 25 mmol/L (ref 22–32)
CO2: 27 mmol/L (ref 22–32)
Calcium: 9.2 mg/dL (ref 8.9–10.3)
Calcium: 8.6 mg/dL — ABNORMAL LOW (ref 8.9–10.3)
Chloride: 97 mmol/L — ABNORMAL LOW (ref 101–111)
Chloride: 97 mmol/L — ABNORMAL LOW (ref 101–111)
Creatinine, Ser: 3.07 mg/dL — ABNORMAL HIGH (ref 0.44–1.00)
Creatinine, Ser: 3.01 mg/dL — ABNORMAL HIGH (ref 0.44–1.00)
GFR calc Af Amer: 17 mL/min — ABNORMAL LOW (ref >60)
GFR calc Af Amer: 18 mL/min — ABNORMAL LOW (ref >60)
GFR calc non Af Amer: 15 mL/min — ABNORMAL LOW (ref >60)
GFR calc non Af Amer: 15 mL/min — ABNORMAL LOW (ref >60)
Glucose, Bld: 196 mg/dL — ABNORMAL HIGH (ref 65–99)
Glucose, Bld: 191 mg/dL — ABNORMAL HIGH (ref 65–99)
Potassium: 3.1 mmol/L — ABNORMAL LOW (ref 3.5–5.1)
Potassium: 3 mmol/L — ABNORMAL LOW (ref 3.5–5.1)
Sodium: 137 mmol/L (ref 135–145)
Sodium: 135 mmol/L (ref 135–145)
Total Bilirubin: 0.4 mg/dL (ref 0.3–1.2)
Total Bilirubin: 0.7 mg/dL (ref 0.3–1.2)
Total Protein: 7.9 g/dL (ref 6.5–8.1)
Total Protein: 6.6 g/dL (ref 6.5–8.1)

## 2015-03-15 LAB — D-DIMER, QUANTITATIVE (NOT AT ARMC): D-Dimer, Quant: 1.65 ug/mL-FEU — ABNORMAL HIGH (ref 0.00–0.48)

## 2015-03-15 LAB — I-STAT TROPONIN, ED: Troponin i, poc: 0.03 ng/mL (ref 0.00–0.08)

## 2015-03-15 LAB — MRSA PCR SCREENING: MRSA by PCR: NEGATIVE

## 2015-03-15 LAB — BRAIN NATRIURETIC PEPTIDE: B Natriuretic Peptide: 1025.4 pg/mL — ABNORMAL HIGH (ref 0.0–100.0)

## 2015-03-15 LAB — TROPONIN I: Troponin I: 0.03 ng/mL (ref <0.031)

## 2015-03-15 MED ORDER — ACETAMINOPHEN 325 MG PO TABS: 650.0000 mg | ORAL_TABLET | Freq: Four times a day (QID) | ORAL | Status: DC | PRN

## 2015-03-15 MED ORDER — ATORVASTATIN CALCIUM 20 MG PO TABS
20.0000 mg | ORAL_TABLET | Freq: Every day | ORAL | Status: DC
  Administered 2015-03-15 – 2015-03-16 (×2): 20 mg via ORAL
  Filled 2015-03-15 (×4): qty 1

## 2015-03-15 MED ORDER — FUROSEMIDE 10 MG/ML IJ SOLN: 60.0000 mg | Freq: Two times a day (BID) | INTRAMUSCULAR | Status: DC

## 2015-03-15 MED ORDER — AMLODIPINE BESYLATE 10 MG PO TABS
10.0000 mg | ORAL_TABLET | Freq: Every day | ORAL | Status: DC
  Administered 2015-03-15 – 2015-03-17 (×3): 10 mg via ORAL
  Filled 2015-03-15 (×3): qty 1

## 2015-03-15 MED ORDER — FUROSEMIDE 10 MG/ML IJ SOLN
60.0000 mg | Freq: Once | INTRAMUSCULAR | Status: AC
  Administered 2015-03-15: 60 mg via INTRAVENOUS
  Filled 2015-03-15: qty 6

## 2015-03-15 MED ORDER — ENOXAPARIN SODIUM 30 MG/0.3ML ~~LOC~~ SOLN
30.0000 mg | Freq: Every day | SUBCUTANEOUS | Status: DC
  Administered 2015-03-15 – 2015-03-17 (×3): 30 mg via SUBCUTANEOUS
  Filled 2015-03-15 (×3): qty 0.3

## 2015-03-15 MED ORDER — VITAMIN D (ERGOCALCIFEROL) 1.25 MG (50000 UNIT) PO CAPS: 50000.0000 [IU] | ORAL_CAPSULE | ORAL | Status: DC

## 2015-03-15 MED ORDER — LORATADINE 10 MG PO TABS
10.0000 mg | ORAL_TABLET | Freq: Every day | ORAL | Status: DC | PRN
  Administered 2015-03-15 – 2015-03-16 (×2): 10 mg via ORAL
  Filled 2015-03-15 (×2): qty 1

## 2015-03-15 MED ORDER — PANTOPRAZOLE SODIUM 40 MG PO TBEC
40.0000 mg | DELAYED_RELEASE_TABLET | Freq: Every day | ORAL | Status: DC
  Administered 2015-03-15 – 2015-03-17 (×3): 40 mg via ORAL
  Filled 2015-03-15 (×3): qty 1

## 2015-03-15 MED ORDER — SODIUM CHLORIDE 0.9 % IJ SOLN
3.0000 mL | Freq: Two times a day (BID) | INTRAMUSCULAR | Status: DC
  Administered 2015-03-15 – 2015-03-17 (×5): 3 mL via INTRAVENOUS

## 2015-03-15 MED ORDER — INSULIN DETEMIR 100 UNIT/ML ~~LOC~~ SOLN
10.0000 [IU] | Freq: Every day | SUBCUTANEOUS | Status: DC
  Administered 2015-03-15 – 2015-03-16 (×2): 10 [IU] via SUBCUTANEOUS
  Filled 2015-03-15 (×3): qty 0.1

## 2015-03-15 MED ORDER — ASPIRIN 81 MG PO CHEW
81.0000 mg | CHEWABLE_TABLET | Freq: Every day | ORAL | Status: DC
  Administered 2015-03-15 – 2015-03-16 (×2): 81 mg via ORAL
  Filled 2015-03-15 (×3): qty 1

## 2015-03-15 MED ORDER — RENA-VITE PO TABS
1.0000 | ORAL_TABLET | Freq: Every day | ORAL | Status: DC
  Administered 2015-03-15 – 2015-03-16 (×2): 1 via ORAL
  Filled 2015-03-15 (×3): qty 1

## 2015-03-15 MED ORDER — ONDANSETRON HCL 4 MG/2ML IJ SOLN: 4.0000 mg | Freq: Four times a day (QID) | INTRAMUSCULAR | Status: DC | PRN

## 2015-03-15 MED ORDER — HYDROCOD POLST-CPM POLST ER 10-8 MG/5ML PO SUER
5.0000 mL | Freq: Two times a day (BID) | ORAL | Status: DC | PRN
  Administered 2015-03-15: 5 mL via ORAL
  Filled 2015-03-15: qty 5

## 2015-03-15 MED ORDER — GUAIFENESIN 100 MG/5ML PO SYRP
100.0000 mg | ORAL_SOLUTION | Freq: Once | ORAL | Status: AC
  Administered 2015-03-15: 100 mg via ORAL
  Filled 2015-03-15: qty 5

## 2015-03-15 MED ORDER — BUDESONIDE-FORMOTEROL FUMARATE 160-4.5 MCG/ACT IN AERO
1.0000 | INHALATION_SPRAY | Freq: Every evening | RESPIRATORY_TRACT | Status: DC
  Administered 2015-03-16 (×2): 1 via RESPIRATORY_TRACT
  Filled 2015-03-15 (×3): qty 6

## 2015-03-15 MED ORDER — POTASSIUM CHLORIDE CRYS ER 20 MEQ PO TBCR
20.0000 meq | EXTENDED_RELEASE_TABLET | Freq: Two times a day (BID) | ORAL | Status: DC
  Administered 2015-03-15 (×2): 20 meq via ORAL
  Filled 2015-03-15 (×4): qty 1

## 2015-03-15 MED ORDER — CARVEDILOL 25 MG PO TABS
25.0000 mg | ORAL_TABLET | Freq: Two times a day (BID) | ORAL | Status: DC
  Administered 2015-03-15 – 2015-03-17 (×5): 25 mg via ORAL
  Filled 2015-03-15 (×7): qty 1

## 2015-03-15 MED ORDER — ALBUTEROL SULFATE (2.5 MG/3ML) 0.083% IN NEBU
2.5000 mg | INHALATION_SOLUTION | Freq: Four times a day (QID) | RESPIRATORY_TRACT | Status: DC | PRN
Start: 2015-03-15 — End: 2015-03-17

## 2015-03-15 MED ORDER — ISOSORB DINITRATE-HYDRALAZINE 20-37.5 MG PO TABS
2.0000 | ORAL_TABLET | Freq: Three times a day (TID) | ORAL | Status: DC
  Administered 2015-03-15 – 2015-03-17 (×7): 2 via ORAL
  Filled 2015-03-15 (×9): qty 2

## 2015-03-15 MED ORDER — CALCITRIOL 0.25 MCG PO CAPS
0.2500 ug | ORAL_CAPSULE | Freq: Every day | ORAL | Status: DC
  Administered 2015-03-15 – 2015-03-17 (×3): 0.25 ug via ORAL
  Filled 2015-03-15 (×3): qty 1

## 2015-03-15 MED ORDER — ACETAMINOPHEN 650 MG RE SUPP: 650.0000 mg | Freq: Four times a day (QID) | RECTAL | Status: DC | PRN

## 2015-03-15 MED ORDER — INSULIN ASPART 100 UNIT/ML ~~LOC~~ SOLN
0.0000 [IU] | Freq: Three times a day (TID) | SUBCUTANEOUS | Status: DC
  Administered 2015-03-15: 1 [IU] via SUBCUTANEOUS
  Administered 2015-03-15: 3 [IU] via SUBCUTANEOUS
  Administered 2015-03-15 – 2015-03-16 (×2): 2 [IU] via SUBCUTANEOUS
  Administered 2015-03-17: 1 [IU] via SUBCUTANEOUS

## 2015-03-15 MED ORDER — FERROUS SULFATE 325 (65 FE) MG PO TABS
325.0000 mg | ORAL_TABLET | Freq: Every day | ORAL | Status: DC
  Administered 2015-03-15 – 2015-03-17 (×3): 325 mg via ORAL
  Filled 2015-03-15 (×4): qty 1

## 2015-03-15 MED ORDER — FUROSEMIDE 10 MG/ML IJ SOLN
80.0000 mg | Freq: Three times a day (TID) | INTRAMUSCULAR | Status: DC
  Administered 2015-03-15 – 2015-03-17 (×6): 80 mg via INTRAVENOUS
  Filled 2015-03-15 (×8): qty 8

## 2015-03-15 MED ORDER — CLONIDINE HCL 0.1 MG PO TABS
0.1000 mg | ORAL_TABLET | Freq: Three times a day (TID) | ORAL | Status: DC
Start: 2015-03-15 — End: 2015-03-17
  Administered 2015-03-15 – 2015-03-17 (×7): 0.1 mg via ORAL
  Filled 2015-03-15 (×10): qty 1

## 2015-03-15 MED ORDER — ONDANSETRON HCL 4 MG PO TABS: 4.0000 mg | ORAL_TABLET | Freq: Four times a day (QID) | ORAL | Status: DC | PRN

## 2015-03-15 MED ORDER — ONDANSETRON HCL 4 MG/2ML IJ SOLN
4.0000 mg | Freq: Once | INTRAMUSCULAR | Status: AC
  Administered 2015-03-15: 4 mg via INTRAVENOUS
  Filled 2015-03-15: qty 2

## 2015-03-15 NOTE — ED Provider Notes (Signed)
CSN: [id number]     Arrival date & time 03/15/15  0159 History  This chart was scribed for Destiny Rice, MD by Randa Evens, ED Scribe. This patient was seen in room B16C/B16C and the patient's care was started at 2:03 AM.    Chief Complaint  Patient presents with  . Shortness of Breath  . Congestive Heart Failure   The history is provided by the patient. No language interpreter was used.   HPI Comments: Destiny FAUROT is a 64 y.o. female with PMHx listed below who presents to the Emergency Department complaining of worsening  SOB onset tonight. Pt reports associated cough and post tussive vomiting. Pt states that her SOB is worse when coughing.  Pt doesn't report any alleviating factors. Pt reports HX of CHF and feels that she has a lot of fluid built up. Pt denies abdominal pain or CP. Pt reports recent fistula  placement in July and is due to start dialysis soon.     Past Medical History  Diagnosis Date  . Hypertension   . Diverticulitis   . Hyperlipidemia   . Asthma   . Diabetes mellitus     insulin dependent  . Arthritis     knee  . GI bleed 03/31/2013  . CHF (congestive heart failure)   . Osteoporosis   . Seasonal allergies   . Anxiety   . Abdominal bruit   . Chronic kidney disease     stage 3  . Sleep apnea     can't afford cpap  . Pneumonia   . GERD (gastroesophageal reflux disease)     from medications  . Complication of anesthesia     " after I got home from my last procedure, I started itching."  . Anemia    Past Surgical History  Procedure Laterality Date  . Abdominal hysterectomy  1993`  . Esophagogastroduodenoscopy N/A 03/31/2013    Procedure: ESOPHAGOGASTRODUODENOSCOPY (EGD);  Surgeon: Gatha Mayer, MD;  Location: [facility] ENDOSCOPY;  Service: Endoscopy;  Laterality: N/A;  . Eye surgery      laser surgery  . Bascilic vein transposition Left 07/10/2014    Procedure: BASCILIC VEIN TRANSPOSITION;  Surgeon: Angelia Mould, MD;  Location: [place];   Service: Vascular;  Laterality: Left;  . Fistulogram Left 10/29/2014    Procedure: FISTULOGRAM;  Surgeon: Angelia Mould, MD;  Location: [facility] CATH LAB;  Service: Cardiovascular;  Laterality: Left;  . Bascilic vein transposition Right 11/08/2014    Procedure: FIRST STAGE BASILIC VEIN TRANSPOSITION;  Surgeon: Angelia Mould, MD;  Location: [place];  Service: Vascular;  Laterality: Right;  . Bascilic vein transposition Right 01/18/2015    Procedure: SECOND STAGE BASILIC VEIN TRANSPOSITION;  Surgeon: Angelia Mould, MD;  Location: [facility] OR;  Service: Vascular;  Laterality: Right;  . Patch angioplasty Right 01/18/2015    Procedure: BASILIC VEIN PATCH ANGIOPLASTY USING VASCUGUARD PATCH;  Surgeon: Angelia Mould, MD;  Location: [facility] OR;  Service: Vascular;  Laterality: Right;   Family History  Problem Relation Age of Onset  . Colon cancer Neg Hx   . Other Mother     not sure of cause of death  . Diabetes Father   . Pancreatic cancer Maternal Grandmother    Social History  Substance Use Topics  . Smoking status: Former Smoker -- 0.35 packs/day for 40 years    Types: Cigarettes    Quit date: 05/10/2012  . Smokeless tobacco: Never Used  . Alcohol Use: No   OB  History    No data available     Review of Systems  Respiratory: Positive for cough and shortness of breath.   Cardiovascular: Negative for chest pain, palpitations and leg swelling.  Gastrointestinal: Positive for vomiting. Negative for nausea and anal bleeding.  Musculoskeletal: Negative for back pain.  Neurological: Negative for dizziness, light-headedness and headaches.  All other systems reviewed and are negative.     Allergies  Lisinopril and Prednisone  Home Medications   Prior to Admission medications   Medication Sig Start Date End Date Taking? Authorizing Provider  albuterol (PROVENTIL HFA;VENTOLIN HFA) 108 (90 BASE) MCG/ACT inhaler Inhale 1-2 puffs into the lungs every 6 (six) hours as needed for  wheezing or shortness of breath.   Yes Historical Provider, MD  amLODipine (NORVASC) 10 MG tablet Take 1 tablet (10 mg total) by mouth daily. 07/23/13  Yes Velvet Bathe, MD  aspirin 81 MG chewable tablet Chew 81 mg by mouth daily.   Yes Historical Provider, MD  atorvastatin (LIPITOR) 20 MG tablet Take 20 mg by mouth daily at 6 PM.   Yes Historical Provider, MD  budesonide-formoterol (SYMBICORT) 160-4.5 MCG/ACT inhaler Inhale 1 puff into the lungs every evening.   Yes Historical Provider, MD  calcitRIOL (ROCALTROL) 0.25 MCG capsule Take 0.25 mcg by mouth daily.   Yes Historical Provider, MD  carvedilol (COREG) 25 MG tablet Take 1 tablet (25 mg total) by mouth 2 (two) times daily with a meal. 06/01/13  Yes Costin Karlyne Greenspan, MD  cloNIDine (CATAPRES) 0.1 MG tablet Take 1 tablet (0.1 mg total) by mouth 3 (three) times daily. 09/05/14  Yes Velna Hatchet, MD  ferrous sulfate 325 (65 FE) MG tablet Take 325 mg by mouth daily with breakfast.   Yes Historical Provider, MD  insulin aspart (NOVOLOG) 100 UNIT/ML injection Inject 0-8 Units into the skin 3 (three) times daily before meals. Sliding scale   Yes Historical Provider, MD  isosorbide-hydrALAZINE (BIDIL) 20-37.5 MG per tablet Take 2 tablets by mouth 3 (three) times daily. 01/12/14  Yes Barton Dubois, MD  LEVEMIR FLEXTOUCH 100 UNIT/ML Pen Inject 10 Units into the muscle at bedtime.  01/16/15  Yes Historical Provider, MD  loratadine (CLARITIN) 10 MG tablet Take 10 mg by mouth daily as needed for allergies.   Yes Historical Provider, MD  metolazone (ZAROXOLYN) 5 MG tablet Take 5 mg by mouth daily as needed (fluid).  08/08/14  Yes Historical Provider, MD  multivitamin (RENA-VIT) TABS tablet Take 1 tablet by mouth daily. 01/24/15  Yes Historical Provider, MD  omeprazole (PRILOSEC) 20 MG capsule Take 20 mg by mouth daily.   Yes Historical Provider, MD  potassium chloride SA (K-DUR,KLOR-CON) 20 MEQ tablet Take 1 tablet (20 mEq total) by mouth 2 (two) times daily.  12/10/14  Yes Linton Flemings, MD  torsemide (DEMADEX) 100 MG tablet Take 1 tablet (100 mg total) by mouth daily. 09/05/14  Yes Velna Hatchet, MD  Vitamin D, Ergocalciferol, (DRISDOL) 50000 UNITS CAPS capsule Take 50,000 Units by mouth every 7 (seven) days. Mondays   Yes Historical Provider, MD   BP 166/78 mmHg  Pulse 82  Resp 19  Ht 5\' 5"  (1.651 m)  Wt 140 lb (63.504 kg)  BMI 23.30 kg/m2  SpO2 94%   Physical Exam  Constitutional: She is oriented to person, place, and time. She appears well-developed and well-nourished. She appears distressed.  Patient actively vomiting  HENT:  Head: Normocephalic and atraumatic.  Mouth/Throat: Oropharynx is clear and moist.  Eyes: EOM are normal.  Pupils are equal, round, and reactive to light.  Neck: Normal range of motion. Neck supple.  Cardiovascular: Normal rate and regular rhythm.   Pulmonary/Chest: She is in respiratory distress. She has no wheezes. She has rales.  Patient with increased respiratory effort and tachypnea. Crackles in bilateral bases.  Abdominal: Soft. Bowel sounds are normal.  Musculoskeletal: Normal range of motion. She exhibits no edema or tenderness.  Neurological: She is alert and oriented to person, place, and time.  Skin: Skin is warm and dry. No rash noted. No erythema.  Psychiatric: She has a normal mood and affect. Her behavior is normal.  Nursing note and vitals reviewed.   ED Course  Procedures (including critical care time)  COORDINATION OF CARE: 2:11 AM-Discussed treatment plan with pt at bedside and pt agreed to plan.     Labs Review Labs Reviewed  COMPREHENSIVE METABOLIC PANEL - Abnormal; Notable for the following:    Potassium 3.1 (*)    Chloride 97 (*)    Glucose, Bld 196 (*)    BUN 50 (*)    Creatinine, Ser 3.07 (*)    Alkaline Phosphatase 204 (*)    GFR calc non Af Amer 15 (*)    GFR calc Af Amer 17 (*)    All other components within normal limits  BRAIN NATRIURETIC PEPTIDE - Abnormal; Notable for  the following:    B Natriuretic Peptide 1025.4 (*)    All other components within normal limits  CBC WITH DIFFERENTIAL/PLATELET  Randolm Idol, ED    Imaging Review Dg Chest Port 1 View  03/15/2015   CLINICAL DATA:  Acute onset of shortness of breath and decreased O2 saturation. Vomiting. Initial encounter.  EXAM: PORTABLE CHEST - 1 VIEW  COMPARISON:  Chest radiograph performed 02/03/2015  FINDINGS: The lungs are well-aerated. Vascular congestion is noted, with bibasilar airspace opacities, likely reflecting mild interstitial edema. No pleural effusion or pneumothorax is seen.  The cardiomediastinal silhouette is borderline normal in size. No acute osseous abnormalities are seen. There is chronic superior subluxation of both humeral heads.  IMPRESSION: Vascular congestion, with bibasilar airspace opacities, likely reflecting mild interstitial edema.   Electronically Signed   By: Garald Balding M.D.   On: 03/15/2015 03:04   I have personally reviewed and evaluated these images and lab results as part of my medical decision-making.   EKG Interpretation   Date/Time:  Friday March 15 2015 02:14:50 EDT Ventricular Rate:  104 PR Interval:  185 QRS Duration: 85 QT Interval:  358 QTC Calculation: 471 R Axis:   49 Text Interpretation:  Sinus tachycardia Probable left atrial enlargement  Anteroseptal infarct, old Nonspecific T abnormalities, lateral leads  Minimal ST elevation, inferior leads Confirmed by Lita Mains  MD, Megan Presti  ([id number]) on 03/15/2015 4:38:15 AM      MDM   Final diagnoses:  CHF exacerbation      I personally performed the services described in this documentation, which was scribed in my presence. The recorded information has been reviewed and is accurate.  Patient's breathing is improved after Lasix and she has diuresed some in the emergency department. Chest x-ray with evidence of pulmonary edema. Discussed with [facility] hospitalists and will admit to telemetry bed for  continued diuresis.     Destiny Rice, MD 03/15/15 [phone]

## 2015-03-15 NOTE — H&P (Signed)
[facility] History and Physical  Destiny Day [id number] DOB: March 15, 1951 DOA: 03/15/2015  Referring physician: Dr. Lita Mains. PCP: Velna Hatchet, MD  Specialists: Dr. Lorrene Reid. Nephrologist.  Chief Complaint: Shortness of breath.  HPI: Destiny Day is a 64 y.o. 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 Patient states she went to bed around 1 AM and patient suddenly became short of breath and is finding it difficult to lie flat. Patient has been having chronic cough for last 2 months. Patient had 3-4 episodes of nausea vomiting after patient became short of breath. Patient states her vomiting was triggered by her coughing spells. In the ER patient was found to have elevated JVD with chest x-ray showing congestion. Patient has been admitted for CHF. Patient denies any chest pain fever chills. Denies any abdominal pain and abdomen appears benign on exam. Patient has received 60 mg of Lasix in the ER so far had 200 mL output. Patient states she has been compliant with her torsemide.  Review of Systems: As presented in the history of presenting illness, rest negative.  Past Medical History  Diagnosis Date  . Hypertension   . Diverticulitis   . Hyperlipidemia   . Asthma   . Diabetes mellitus     insulin dependent  . Arthritis     knee  . GI bleed 03/31/2013  . CHF (congestive heart failure)   . Osteoporosis   . Seasonal allergies   . Anxiety   . Abdominal bruit   . Chronic kidney disease     stage 3  . Sleep apnea     can't afford cpap  . Pneumonia   . GERD (gastroesophageal reflux disease)     from medications  . Complication of anesthesia     " after I got home from my last procedure, I started itching."  . Anemia    Past  Surgical History  Procedure Laterality Date  . Abdominal hysterectomy  1993`  . Esophagogastroduodenoscopy N/A 03/31/2013    Procedure: ESOPHAGOGASTRODUODENOSCOPY (EGD);  Surgeon: Gatha Mayer, MD;  Location: [facility] ENDOSCOPY;  Service: Endoscopy;  Laterality: N/A;  . Eye surgery      laser surgery  . Bascilic vein transposition Left 07/10/2014    Procedure: BASCILIC VEIN TRANSPOSITION;  Surgeon: Angelia Mould, MD;  Location: [place];  Service: Vascular;  Laterality: Left;  . Fistulogram Left 10/29/2014    Procedure: FISTULOGRAM;  Surgeon: Angelia Mould, MD;  Location: [facility] CATH LAB;  Service: Cardiovascular;  Laterality: Left;  . Bascilic vein transposition Right 11/08/2014    Procedure: FIRST STAGE BASILIC VEIN TRANSPOSITION;  Surgeon: Angelia Mould, MD;  Location: [facility];  Service: Vascular;  Laterality: Right;  . Bascilic vein transposition Right 01/18/2015    Procedure: SECOND STAGE BASILIC VEIN TRANSPOSITION;  Surgeon: Angelia Mould, MD;  Location: [place];  Service: Vascular;  Laterality: Right;  . Patch angioplasty Right 01/18/2015    Procedure: BASILIC VEIN PATCH ANGIOPLASTY USING VASCUGUARD PATCH;  Surgeon: Angelia Mould, MD;  Location: [place];  Service: Vascular;  Laterality: Right;   Social History:  reports that she quit smoking about 2 years ago. Her smoking use included Cigarettes. She has a 14 pack-year smoking history. She has never used smokeless tobacco. She reports that she uses  illicit drugs. She reports that she does not drink alcohol. Where does patient live home. Can patient participate in ADLs? Yes.  Allergies  Allergen Reactions  . Lisinopril Cough  . Prednisone Other (See Comments)    Excessive fluid buildup    Family History:  Family History  Problem Relation Age of Onset  . Colon cancer Neg Hx   . Other Mother     not sure of cause of death  . Diabetes Father   . Pancreatic cancer Maternal Grandmother       Prior to Admission  medications   Medication Sig Start Date End Date Taking? Authorizing Provider  albuterol (PROVENTIL HFA;VENTOLIN HFA) 108 (90 BASE) MCG/ACT inhaler Inhale 1-2 puffs into the lungs every 6 (six) hours as needed for wheezing or shortness of breath.   Yes Historical Provider, MD  amLODipine (NORVASC) 10 MG tablet Take 1 tablet (10 mg total) by mouth daily. 07/23/13  Yes Velvet Bathe, MD  aspirin 81 MG chewable tablet Chew 81 mg by mouth daily.   Yes Historical Provider, MD  atorvastatin (LIPITOR) 20 MG tablet Take 20 mg by mouth daily at 6 PM.   Yes Historical Provider, MD  budesonide-formoterol (SYMBICORT) 160-4.5 MCG/ACT inhaler Inhale 1 puff into the lungs every evening.   Yes Historical Provider, MD  calcitRIOL (ROCALTROL) 0.25 MCG capsule Take 0.25 mcg by mouth daily.   Yes Historical Provider, MD  carvedilol (COREG) 25 MG tablet Take 1 tablet (25 mg total) by mouth 2 (two) times daily with a meal. 06/01/13  Yes Costin Karlyne Greenspan, MD  cloNIDine (CATAPRES) 0.1 MG tablet Take 1 tablet (0.1 mg total) by mouth 3 (three) times daily. 09/05/14  Yes Velna Hatchet, MD  ferrous sulfate 325 (65 FE) MG tablet Take 325 mg by mouth daily with breakfast.   Yes Historical Provider, MD  insulin aspart (NOVOLOG) 100 UNIT/ML injection Inject 0-8 Units into the skin 3 (three) times daily before meals. Sliding scale   Yes Historical Provider, MD  isosorbide-hydrALAZINE (BIDIL) 20-37.5 MG per tablet Take 2 tablets by mouth 3 (three) times daily. 01/12/14  Yes Barton Dubois, MD  LEVEMIR FLEXTOUCH 100 UNIT/ML Pen Inject 10 Units into the muscle at bedtime.  01/16/15  Yes Historical Provider, MD  loratadine (CLARITIN) 10 MG tablet Take 10 mg by mouth daily as needed for allergies.   Yes Historical Provider, MD  metolazone (ZAROXOLYN) 5 MG tablet Take 5 mg by mouth daily as needed (fluid).  08/08/14  Yes Historical Provider, MD  multivitamin (RENA-VIT) TABS tablet Take 1 tablet by mouth daily. 01/24/15  Yes Historical Provider,  MD  omeprazole (PRILOSEC) 20 MG capsule Take 20 mg by mouth daily.   Yes Historical Provider, MD  potassium chloride SA (K-DUR,KLOR-CON) 20 MEQ tablet Take 1 tablet (20 mEq total) by mouth 2 (two) times daily. 12/10/14  Yes Linton Flemings, MD  torsemide (DEMADEX) 100 MG tablet Take 1 tablet (100 mg total) by mouth daily. 09/05/14  Yes Velna Hatchet, MD  Vitamin D, Ergocalciferol, (DRISDOL) 50000 UNITS CAPS capsule Take 50,000 Units by mouth every 7 (seven) days. Mondays   Yes Historical Provider, MD    Physical Exam: Filed Vitals:   03/15/15 0430 03/15/15 0445 03/15/15 0500 03/15/15 0547  BP: 150/69 160/66 153/85 179/69  Pulse: 76 76 75 79  Temp:    98 F (36.7 C)  TempSrc:    Oral  Resp: 13 19 14 20   Height:    5\' 5"  (1.651 m)  Weight:  65.953 kg (145 lb 6.4 oz)  SpO2: 97% 97% 100% 100%     General:  Moderately built and nourished.  Eyes: Anicteric no pallor.  ENT: No discharge from the ears eyes nose and mouth.  Neck: JVD is elevated. No mass felt.  Cardiovascular: S1 and S2 heard.  Respiratory: No rhonchi or crepitations.  Abdomen: Soft nontender bowel sounds present. No guarding or rigidity.  Skin: No rash.  Musculoskeletal: No edema.  Psychiatric: Appears normal.  Neurologic: Alert awake oriented to time place and person. Moves all extremities.  Labs on Admission:  Basic Metabolic Panel:  Recent Labs Lab 03/15/15 0225  NA 137  K 3.1*  CL 97*  CO2 25  GLUCOSE 196*  BUN 50*  CREATININE 3.07*  CALCIUM 9.2   Liver Function Tests:  Recent Labs Lab 03/15/15 0225  AST 37  ALT 35  ALKPHOS 204*  BILITOT 0.4  PROT 7.9  ALBUMIN 3.5   No results for input(s): LIPASE, AMYLASE in the last 168 hours. No results for input(s): AMMONIA in the last 168 hours. CBC:  Recent Labs Lab 03/15/15 0225  WBC 6.4  NEUTROABS 3.3  HGB 13.5  HCT 40.1  MCV 91.1  PLT 294   Cardiac Enzymes: No results for input(s): CKTOTAL, CKMB, CKMBINDEX, TROPONINI in the last  168 hours.  BNP (last 3 results)  Recent Labs  12/10/14 0047 02/03/15 1337 03/15/15 0225  BNP 1121.7* 895.3* 1025.4*    ProBNP (last 3 results)  Recent Labs  03/22/14 2338  PROBNP 3101.0*    CBG: No results for input(s): GLUCAP in the last 168 hours.  Radiological Exams on Admission: Dg Chest Port 1 View  03/15/2015   CLINICAL DATA:  Acute onset of shortness of breath and decreased O2 saturation. Vomiting. Initial encounter.  EXAM: PORTABLE CHEST - 1 VIEW  COMPARISON:  Chest radiograph performed 02/03/2015  FINDINGS: The lungs are well-aerated. Vascular congestion is noted, with bibasilar airspace opacities, likely reflecting mild interstitial edema. No pleural effusion or pneumothorax is seen.  The cardiomediastinal silhouette is borderline normal in size. No acute osseous abnormalities are seen. There is chronic superior subluxation of both humeral heads.  IMPRESSION: Vascular congestion, with bibasilar airspace opacities, likely reflecting mild interstitial edema.   Electronically Signed   By: Garald Balding M.D.   On: 03/15/2015 03:04    EKG: Independently reviewed. Sinus tachycardia with nonspecific ST changes.  Assessment/Plan Principal Problem:   Diastolic CHF, acute on chronic Active Problems:   Essential hypertension   Asthma   Type 2 diabetes mellitus with diabetic nephropathy   CKD (chronic kidney disease) stage 4, GFR 15-29 ml/min   CHF (congestive heart failure)   1. Shortness of breath most likely secondary to acute on chronic diastolic CHF last EF measured in February 2016 was 50-55% with grade 3 diastolic dysfunction - at this time I have placed patient on Lasix 60 mg IV every 12 hourly. Patient also has progressive renal failure with AV fistula in her right arm. If patient does not make significant urine output and may need to consult nephrology. Closely follow intake and output metabolic panel. Check daily weights. Patient's weight has actually not changed  much from a baseline of 140 pounds. Check d-dimer. 2. 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 - see #1. 3. Hypertension uncontrolled - at this time patient has been placed on IV Lasix and continue home medications of clonidine and amlodipine and Bidil. 4. History of asthma - presently not wheezing continue inhalers. Patient does  have chronic cough which could be either from fluid overload or asthma. 5. Diabetes mellitus type 2 - patient is on Levemir and I have placed patient on sliding scale coverage.  I have reviewed patient's old charts and labs. I have personally reviewed patient's chest x-ray and EKG.   DVT Prophylaxis Lovenox.  Code Status: Full code.  Family Communication: Discussed with patient's husband.  Disposition Plan: Admit to inpatient.    Linn Goetze N. [facility] Pager [phone].  If 7PM-7AM, please contact night-coverage [email or web address] Password [facility] 03/15/2015, 6:24 AM

## 2015-03-15 NOTE — ED Notes (Signed)
Admitting MD in to see patient. °

## 2015-03-15 NOTE — Progress Notes (Signed)
PATIENT DETAILS Name: Destiny Day Age: 64 y.o. Sex: female Date of Birth: [birth year] Admit Date: 03/15/2015 Admitting Physician Rise Patience, MD [id number], Nicki Reaper, MD  Subjective: Improved-still with shortness of breath and orthopnea.  Assessment/Plan: Principal Problem: Diastolic CHF, acute on chronic: Improved with intravenous Lasix-continue, follow weights/intake output-currently unreliable as just admitted earlier this morning.  Active Problems: Chronic kidney disease stage IV: Creatinine close to her usual baseline. Follow closely while on Lasix. Follows with Dr. Everlean Alstrom has access in right arm.  Essential hypertension: Continue amlodipine, clonidine, Coreg, BiDil-blood pressure reasonably controlled-following titrate accordingly.   Bronchial Asthma: Appears stable-lungs clear.  Continue bronchodilators.    Type 2 diabetes: CBGs stableContinue Levemir 10 units and SSI. Follow-up.   Dyslipidemia: Continue statin  GERD: Continue PPI   Disposition: Remain inpatient  Antimicrobial agents  See below  Anti-infectives    None      DVT Prophylaxis: Prophylactic Lovenox   Code Status: Full code   Family Communication None at bedside   Procedures: None  CONSULTS:  None  Time spent 30 minutes-Greater than 50% of this time was spent in counseling, explanation of diagnosis, planning of further management, and coordination of care.  MEDICATIONS: Scheduled Meds: . amLODipine  10 mg Oral Daily  . aspirin  81 mg Oral Daily  . atorvastatin  20 mg Oral q1800  . budesonide-formoterol  1 puff Inhalation QPM  . calcitRIOL  0.25 mcg Oral Daily  . carvedilol  25 mg Oral BID WC  . cloNIDine  0.1 mg Oral 3 times per day  . enoxaparin (LOVENOX) injection  30 mg Subcutaneous Daily  . ferrous sulfate  325 mg Oral Q breakfast  . furosemide  80 mg Intravenous 3 times per day  . insulin aspart  0-9 Units Subcutaneous TID WC  .  insulin detemir  10 Units Subcutaneous QHS  . isosorbide-hydrALAZINE  2 tablet Oral TID  . multivitamin  1 tablet Oral QHS  . pantoprazole  40 mg Oral Daily  . potassium chloride SA  20 mEq Oral BID  . sodium chloride  3 mL Intravenous Q12H  . [START ON 03/18/2015] Vitamin D (Ergocalciferol)  50,000 Units Oral Q Mon   Continuous Infusions:  PRN Meds:.acetaminophen **OR** acetaminophen, albuterol, chlorpheniramine-HYDROcodone, loratadine, ondansetron **OR** ondansetron (ZOFRAN) IV    PHYSICAL EXAM: Vital signs in last 24 hours: Filed Vitals:   03/15/15 0445 03/15/15 0500 03/15/15 0547 03/15/15 0943  BP: 160/66 153/85 179/69 130/78  Pulse: 76 75 79   Temp:   98 F (36.7 C)   TempSrc:   Oral   Resp: 19 14 20    Height:   5\' 5"  (1.651 m)   Weight:   65.953 kg (145 lb 6.4 oz)   SpO2: 97% 100% 100%     Weight change:  Filed Weights   03/15/15 0219 03/15/15 0547  Weight: 63.504 kg (140 lb) 65.953 kg (145 lb 6.4 oz)   Body mass index is 24.2 kg/(m^2).   Gen Exam: Awake and alert with clear speech.  Neck: Supple, No JVD.   Chest: Bibasilar rales CVS: S1 S2 Regular, no murmurs.  Abdomen: soft, BS +, non tender, non distended.  Extremities: no edema, lower extremities warm to touch. Neurologic: Non Focal.   Skin: No Rash.   Wounds: N/A.   Intake/Output from previous day:  Intake/Output Summary (Last 24 hours) at 03/15/15 1238 Last data filed at 03/15/15 [phone]  Gross per 24 hour  Intake    480 ml  Output    500 ml  Net    -20 ml     LAB RESULTS: CBC  Recent Labs Lab 03/15/15 0225 03/15/15 0744  WBC 6.4 7.5  HGB 13.5 11.3*  HCT 40.1 34.1*  PLT 294 251  MCV 91.1 92.2  MCH 30.7 30.5  MCHC 33.7 33.1  RDW 14.3 14.5  LYMPHSABS 2.6 1.8  MONOABS 0.3 0.4  EOSABS 0.3 0.1  BASOSABS 0.0 0.0    Chemistries   Recent Labs Lab 03/15/15 0225 03/15/15 0744  NA 137 135  K 3.1* 3.0*  CL 97* 97*  CO2 25 27  GLUCOSE 196* 191*  BUN 50* 52*  CREATININE 3.07* 3.01*    CALCIUM 9.2 8.6*    CBG:  Recent Labs Lab 03/15/15 0705 03/15/15 1145  GLUCAP 186* 206*    GFR Estimated Creatinine Clearance: 17 mL/min (by C-G formula based on Cr of 3.01).  Coagulation profile No results for input(s): INR, PROTIME in the last 168 hours.  Cardiac Enzymes  Recent Labs Lab 03/15/15 0744  TROPONINI 0.03    Invalid input(s): POCBNP  Recent Labs  03/15/15 0744  DDIMER 1.65*   No results for input(s): HGBA1C in the last 72 hours. No results for input(s): CHOL, HDL, LDLCALC, TRIG, CHOLHDL, LDLDIRECT in the last 72 hours. No results for input(s): TSH, T4TOTAL, T3FREE, THYROIDAB in the last 72 hours.  Invalid input(s): FREET3 No results for input(s): VITAMINB12, FOLATE, FERRITIN, TIBC, IRON, RETICCTPCT in the last 72 hours. No results for input(s): LIPASE, AMYLASE in the last 72 hours.  Urine Studies No results for input(s): UHGB, CRYS in the last 72 hours.  Invalid input(s): UACOL, UAPR, USPG, UPH, UTP, UGL, UKET, UBIL, UNIT, UROB, ULEU, UEPI, UWBC, URBC, UBAC, CAST, UCOM, BILUA  MICROBIOLOGY: Recent Results (from the past 240 hour(s))  MRSA PCR Screening     Status: None   Collection Time: 03/15/15  5:52 AM  Result Value Ref Range Status   MRSA by PCR NEGATIVE NEGATIVE Final    Comment:        The GeneXpert MRSA Assay (FDA approved for NASAL specimens only), is one component of a comprehensive MRSA colonization surveillance program. It is not intended to diagnose MRSA infection nor to guide or monitor treatment for MRSA infections.     RADIOLOGY STUDIES/RESULTS: Dg Chest Port 1 View  03/15/2015   CLINICAL DATA:  Acute onset of shortness of breath and decreased O2 saturation. Vomiting. Initial encounter.  EXAM: PORTABLE CHEST - 1 VIEW  COMPARISON:  Chest radiograph performed 02/03/2015  FINDINGS: The lungs are well-aerated. Vascular congestion is noted, with bibasilar airspace opacities, likely reflecting mild interstitial edema. No  pleural effusion or pneumothorax is seen.  The cardiomediastinal silhouette is borderline normal in size. No acute osseous abnormalities are seen. There is chronic superior subluxation of both humeral heads.  IMPRESSION: Vascular congestion, with bibasilar airspace opacities, likely reflecting mild interstitial edema.   Electronically Signed   By: Garald Balding M.D.   On: 03/15/2015 03:04    Oren Binet, MD  [facility] Pager:[phone]  If 7PM-7AM, please contact night-coverage [email or web address] Password [email or web address] 03/15/2015, 12:38 PM   LOS: 0 days

## 2015-03-15 NOTE — ED Notes (Signed)
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 Pt c/o brown thick sputum and then started vomiting.

## 2015-03-15 NOTE — Progress Notes (Signed)
Received pt report from Barbara, RN -ED. 

## 2015-03-15 NOTE — Progress Notes (Signed)
Nutrition Education Note  RD consulted for nutrition education regarding CHF.  RD provided "Heart Failure Nutrition Therapy" handout from the Academy of Nutrition and Dietetics as well as "Tips for Eating with Less Salt" and "Sodium Free Flavoring tips". Reviewed patient's dietary recall. Provided examples on ways to decrease sodium intake in diet. Discouraged intake of processed foods and use of salt shaker. Encouraged fresh fruits and vegetables as well as whole grain sources of carbohydrates to maximize fiber intake. Discouraged eating out and discussed tips for eating less sodium at restaurants.   RD discussed why it is important for patient to adhere to diet recommendations, and emphasized the role of fluids, foods to avoid, and importance of weighing self daily. Teach back method used. Pt states she plans to eat out less and plans to stop eating bologna. RD provided pt with low sodium meal recipes from American Heart Association (Heart.org).   Expect good compliance.  Body mass index is 24.2 kg/(m^2). Pt meets criteria for Normal Weight based on current BMI.  Current diet order is Heart Healthy/carb Modified, patient is consuming approximately 50-75% of meals at this time. She states that her appetite is good ans she is eating well. Labs and medications reviewed. No further nutrition interventions warranted at this time. RD contact information provided. If additional nutrition issues arise, please re-consult RD.   Scarlette Ar RD, LDN Inpatient Clinical Dietitian Pager: [phone] After Hours Pager: [phone]

## 2015-03-16 ENCOUNTER — Inpatient Hospital Stay (HOSPITAL_COMMUNITY): Payer: BC Managed Care – PPO

## 2015-03-16 DIAGNOSIS — I5033 Acute on chronic diastolic (congestive) heart failure: Principal | ICD-10-CM

## 2015-03-16 DIAGNOSIS — R0602 Shortness of breath: Secondary | ICD-10-CM

## 2015-03-16 LAB — GLUCOSE, CAPILLARY
Glucose-Capillary: 92 mg/dL (ref 65–99)
Glucose-Capillary: 194 mg/dL — ABNORMAL HIGH (ref 65–99)
Glucose-Capillary: 96 mg/dL (ref 65–99)
Glucose-Capillary: 174 mg/dL — ABNORMAL HIGH (ref 65–99)

## 2015-03-16 MED ORDER — POTASSIUM CHLORIDE CRYS ER 20 MEQ PO TBCR
40.0000 meq | EXTENDED_RELEASE_TABLET | Freq: Two times a day (BID) | ORAL | Status: DC
  Administered 2015-03-16 – 2015-03-17 (×3): 40 meq via ORAL
  Filled 2015-03-16 (×3): qty 2

## 2015-03-16 MED ORDER — HYDROCOD POLST-CPM POLST ER 10-8 MG/5ML PO SUER
5.0000 mL | Freq: Four times a day (QID) | ORAL | Status: DC | PRN
  Administered 2015-03-16 (×2): 5 mL via ORAL
  Filled 2015-03-16 (×2): qty 5

## 2015-03-16 MED ORDER — MAGNESIUM SULFATE IN D5W 10-5 MG/ML-% IV SOLN
1.0000 g | Freq: Once | INTRAVENOUS | Status: AC
  Administered 2015-03-16: 1 g via INTRAVENOUS
  Filled 2015-03-16: qty 100

## 2015-03-16 MED ORDER — HYDROCOD POLST-CPM POLST ER 10-8 MG/5ML PO SUER: 5.0000 mL | Freq: Two times a day (BID) | ORAL | Status: DC | PRN

## 2015-03-16 MED ORDER — GUAIFENESIN-DM 100-10 MG/5ML PO SYRP
10.0000 mL | ORAL_SOLUTION | ORAL | Status: DC | PRN
  Filled 2015-03-16: qty 10

## 2015-03-16 NOTE — Progress Notes (Signed)
*  PRELIMINARY RESULTS* Vascular Ultrasound Lower extremity venous duplex has been completed.  Preliminary findings: negative for DVT. Right baker's cyst noted. Bilateral enlarged inguinal lymph nodes.    Landry Mellow, RDMS, RVT  03/16/2015, 12:11 PM

## 2015-03-16 NOTE — Progress Notes (Signed)
Ambulated patient in the hall, remained above 97% on roomair.

## 2015-03-16 NOTE — Progress Notes (Addendum)
Attempted to walk patient for the second time but was informed by patient's husband that he had already walked her.

## 2015-03-16 NOTE — Progress Notes (Signed)
PATIENT DETAILS Name: Destiny Day Age: 64 y.o. Sex: female Date of Birth: 11-Mar-1951 Admit Date: 03/15/2015 Admitting Physician Rise Patience, MD [id number], Nicki Reaper, MD  Subjective:  Patient sitting up in bed, denies any headache, no chest pain or cough, improved shortness of breath, no abdominal pain or focal weakness.  Assessment/Plan:   Acute on chronic Diastolic CHF, EF [id number] on recent echocardiogram: Improved with intravenous Lasix-continue,  clinically much improved increase activity likely discharge in 1-2 days. Home oxygen eval. Do not think weight is reliable.  Filed Weights   03/15/15 0219 03/15/15 0547 03/16/15 0520  Weight: 63.504 kg (140 lb) 65.953 kg (145 lb 6.4 oz) 66.8 kg (147 lb 4.3 oz)     Chronic kidney disease stage IV: Creatinine close to her usual baseline of 3.3. Follow closely while on Lasix. Follows with Dr. Everlean Alstrom has access in right arm.  Essential hypertension: Continue amlodipine, clonidine, Coreg, BiDil-blood pressure reasonably controlled-following titrate accordingly.   Bronchial Asthma: Appears stable-lungs clear.  Continue bronchodilators.  No wheezing on exam.  Dyslipidemia: Continue statin  GERD: Continue PPI   Nonspecific elevation of d-dimer ordered upon admission. She has no chest pain or tachycardia, clinically much improved with diuresis, exam consistent with acute on chronic diastolic CHF, check lower extremity duplex is unremarkable no further workup.   Type 2 diabetes: CBGs stable, Continue Levemir 10 units and SSI. Follow-up.   CBG (last 3)   Recent Labs  03/15/15 1625 03/15/15 2130 03/16/15 0652  GLUCAP 128* 204* 92      Disposition: Remain inpatient  Antimicrobial agents  See below  Anti-infectives    None      DVT Prophylaxis: Prophylactic Lovenox   Code Status: Full code   Family Communication Husband bedside   Procedures:  Recent TTE Feb 2016  - Left  ventricle: The cavity size was normal. There was moderateconcentric hypertrophy. Systolic function was normal. Theestimated ejection fraction was in the range of 50% to 55%. Wallmotion was normal; there were no regional wall motionabnormalities. Doppler parameters are consistent with arestrictive pattern, indicative of decreased left ventriculardiastolic compliance and/or increased left atrial pressure (grade3 diastolic dysfunction). Doppler parameters are consistent withboth elevated ventricular end-diastolic filling pressure andelevated left atrial filling pressure. - Left atrium: The atrium was mildly dilated. - Atrial septum: No defect or patent foramen ovale was identified.  Lower extremity venous duplex ordered  CONSULTS:  None  Time spent 30 minutes-Greater than 50% of this time was spent in counseling, explanation of diagnosis, planning of further management, and coordination of care.  MEDICATIONS: Scheduled Meds: . amLODipine  10 mg Oral Daily  . aspirin  81 mg Oral Daily  . atorvastatin  20 mg Oral q1800  . budesonide-formoterol  1 puff Inhalation QPM  . calcitRIOL  0.25 mcg Oral Daily  . carvedilol  25 mg Oral BID WC  . cloNIDine  0.1 mg Oral 3 times per day  . enoxaparin (LOVENOX) injection  30 mg Subcutaneous Daily  . ferrous sulfate  325 mg Oral Q breakfast  . furosemide  80 mg Intravenous 3 times per day  . insulin aspart  0-9 Units Subcutaneous TID WC  . insulin detemir  10 Units Subcutaneous QHS  . isosorbide-hydrALAZINE  2 tablet Oral TID  . multivitamin  1 tablet Oral QHS  . pantoprazole  40 mg Oral Daily  . potassium chloride SA  40 mEq Oral BID  .  sodium chloride  3 mL Intravenous Q12H  . [START ON 03/18/2015] Vitamin D (Ergocalciferol)  50,000 Units Oral Q Mon   Continuous Infusions:  PRN Meds:.acetaminophen **OR** acetaminophen, albuterol, chlorpheniramine-HYDROcodone, guaiFENesin-dextromethorphan, loratadine, ondansetron **OR** ondansetron (ZOFRAN)  IV    PHYSICAL EXAM: Vital signs in last 24 hours: Filed Vitals:   03/15/15 1734 03/15/15 2127 03/16/15 0123 03/16/15 0520  BP: 149/68 130/46 136/61 157/65  Pulse: 69 71 70 72  Temp: 97.7 F (36.5 C) 98.4 F (36.9 C) 97.6 F (36.4 C) 97.6 F (36.4 C)  TempSrc: Oral Oral Oral Oral  Resp: 16 18 18 18   Height:      Weight:    66.8 kg (147 lb 4.3 oz)  SpO2: 99% 99% 100% 100%    Weight change: 3.297 kg (7 lb 4.3 oz) Filed Weights   03/15/15 0219 03/15/15 0547 03/16/15 0520  Weight: 63.504 kg (140 lb) 65.953 kg (145 lb 6.4 oz) 66.8 kg (147 lb 4.3 oz)   Body mass index is 24.51 kg/(m^2).   Gen Exam: Awake and alert with clear speech.  Neck: Supple, +ve JVD.   Chest: Bibasilar rales CVS: S1 S2 Regular, no murmurs.  Abdomen: soft, BS +, non tender, non distended.  Extremities: no edema, lower extremities warm to touch. Neurologic: Non Focal.   Skin: No Rash.   Wounds: N/A.   Intake/Output from previous day:  Intake/Output Summary (Last 24 hours) at 03/16/15 1102 Last data filed at 03/16/15 0847  Gross per 24 hour  Intake    840 ml  Output   1300 ml  Net   -460 ml     LAB RESULTS: CBC  Recent Labs Lab 03/15/15 0225 03/15/15 0744  WBC 6.4 7.5  HGB 13.5 11.3*  HCT 40.1 34.1*  PLT 294 251  MCV 91.1 92.2  MCH 30.7 30.5  MCHC 33.7 33.1  RDW 14.3 14.5  LYMPHSABS 2.6 1.8  MONOABS 0.3 0.4  EOSABS 0.3 0.1  BASOSABS 0.0 0.0    Chemistries   Recent Labs Lab 03/15/15 0225 03/15/15 0744  NA 137 135  K 3.1* 3.0*  CL 97* 97*  CO2 25 27  GLUCOSE 196* 191*  BUN 50* 52*  CREATININE 3.07* 3.01*  CALCIUM 9.2 8.6*    CBG:  Recent Labs Lab 03/15/15 0705 03/15/15 1145 03/15/15 1625 03/15/15 2130 03/16/15 0652  GLUCAP 186* 206* 128* 204* 92    GFR Estimated Creatinine Clearance: 17 mL/min (by C-G formula based on Cr of 3.01).  Coagulation profile No results for input(s): INR, PROTIME in the last 168 hours.  Cardiac Enzymes  Recent Labs Lab  03/15/15 0744  TROPONINI 0.03    Invalid input(s): POCBNP No results for input(s): HGBA1C in the last 72 hours. No results for input(s): CHOL, HDL, LDLCALC, TRIG, CHOLHDL, LDLDIRECT in the last 72 hours. No results for input(s): TSH, T4TOTAL, T3FREE, THYROIDAB in the last 72 hours.  Invalid input(s): FREET3 No results for input(s): VITAMINB12, FOLATE, FERRITIN, TIBC, IRON, RETICCTPCT in the last 72 hours. No results for input(s): LIPASE, AMYLASE in the last 72 hours.  Urine Studies No results for input(s): UHGB, CRYS in the last 72 hours.  Invalid input(s): UACOL, UAPR, USPG, UPH, UTP, UGL, UKET, UBIL, UNIT, UROB, ULEU, UEPI, UWBC, URBC, UBAC, CAST, UCOM, BILUA  MICROBIOLOGY: Recent Results (from the past 240 hour(s))  MRSA PCR Screening     Status: None   Collection Time: 03/15/15  5:52 AM  Result Value Ref Range Status   MRSA by PCR  NEGATIVE NEGATIVE Final    Comment:        The GeneXpert MRSA Assay (FDA approved for NASAL specimens only), is one component of a comprehensive MRSA colonization surveillance program. It is not intended to diagnose MRSA infection nor to guide or monitor treatment for MRSA infections.     RADIOLOGY STUDIES/RESULTS: Dg Chest Port 1 View  03/15/2015   CLINICAL DATA:  Acute onset of shortness of breath and decreased O2 saturation. Vomiting. Initial encounter.  EXAM: PORTABLE CHEST - 1 VIEW  COMPARISON:  Chest radiograph performed 02/03/2015  FINDINGS: The lungs are well-aerated. Vascular congestion is noted, with bibasilar airspace opacities, likely reflecting mild interstitial edema. No pleural effusion or pneumothorax is seen.  The cardiomediastinal silhouette is borderline normal in size. No acute osseous abnormalities are seen. There is chronic superior subluxation of both humeral heads.  IMPRESSION: Vascular congestion, with bibasilar airspace opacities, likely reflecting mild interstitial edema.   Electronically Signed   By: Garald Balding  M.D.   On: 03/15/2015 03:04    Thurnell Lose, MD  [facility] Pager:[phone]  If 7PM-7AM, please contact night-coverage [email or web address] Password [email or web address] 03/16/2015, 11:02 AM   LOS: 1 day

## 2015-03-17 LAB — BASIC METABOLIC PANEL
Anion gap: 10 (ref 5–15)
BUN: 68 mg/dL — ABNORMAL HIGH (ref 6–20)
CO2: 26 mmol/L (ref 22–32)
Calcium: 8.9 mg/dL (ref 8.9–10.3)
Chloride: 99 mmol/L — ABNORMAL LOW (ref 101–111)
Creatinine, Ser: 3.41 mg/dL — ABNORMAL HIGH (ref 0.44–1.00)
GFR calc Af Amer: 15 mL/min — ABNORMAL LOW (ref >60)
GFR calc non Af Amer: 13 mL/min — ABNORMAL LOW (ref >60)
Glucose, Bld: 147 mg/dL — ABNORMAL HIGH (ref 65–99)
Potassium: 4.4 mmol/L (ref 3.5–5.1)
Sodium: 135 mmol/L (ref 135–145)

## 2015-03-17 LAB — GLUCOSE, CAPILLARY
Glucose-Capillary: 123 mg/dL — ABNORMAL HIGH (ref 65–99)
Glucose-Capillary: 55 mg/dL — ABNORMAL LOW (ref 65–99)
Glucose-Capillary: 133 mg/dL — ABNORMAL HIGH (ref 65–99)
Glucose-Capillary: 200 mg/dL — ABNORMAL HIGH (ref 65–99)

## 2015-03-17 LAB — MAGNESIUM: Magnesium: 2.3 mg/dL (ref 1.7–2.4)

## 2015-03-17 MED ORDER — METOLAZONE 2.5 MG PO TABS: 2.5000 mg | ORAL_TABLET | Freq: Once | ORAL | Status: DC

## 2015-03-17 MED ORDER — METOLAZONE 5 MG PO TABS: 5.0000 mg | ORAL_TABLET | Freq: Once | ORAL | Status: DC

## 2015-03-17 MED ORDER — HYDROCOD POLST-CPM POLST ER 10-8 MG/5ML PO SUER: 5.0000 mL | Freq: Four times a day (QID) | ORAL | Status: DC | PRN

## 2015-03-17 MED ORDER — METOLAZONE 2.5 MG PO TABS: 2.5000 mg | ORAL_TABLET | ORAL | Status: DC

## 2015-03-17 NOTE — Progress Notes (Signed)
Utilization Review Completed.Destiny Day T8/21/2016  

## 2015-03-17 NOTE — Progress Notes (Signed)
This am patient cbg drop to 55, patient was symptomatic, juice , cracker and ate breakfast. cbg came up to 133. MD notified. Ambulate patient in hall way, oxygen saturation 98-99%/RA. Patient c/o minimal shortness of breath. MD notified, will continue to monitor patient.

## 2015-03-17 NOTE — Discharge Summary (Signed)
Destiny Day, is a 64 y.o. female  DOB [birth year]  MRN [id number].  Admission date:  03/15/2015  Admitting Physician  Rise Patience, MD  Discharge Date:  03/17/2015   Primary MD  Velna Hatchet, MD  Recommendations for primary care physician for things to follow:   Monitor weight, BMP and diuretic dose closely  Has bilateral inguinal lymphadenopathy on ultrasound, outpatient clinical follow-up by PCP as appropriate.  Must follow with cardiology and renal closely.   Admission Diagnosis  CHF exacerbation [I50.9]   Discharge Diagnosis  CHF exacerbation [I50.9]     Principal Problem:   Diastolic CHF, acute on chronic Active Problems:   Essential hypertension   Asthma   Type 2 diabetes mellitus with diabetic nephropathy   CKD (chronic kidney disease) stage 4, GFR 15-29 ml/min   CHF (congestive heart failure)      Past Medical History  Diagnosis Date  . Hypertension   . Diverticulitis   . Hyperlipidemia   . Asthma   . Diabetes mellitus     insulin dependent  . Arthritis     knee  . GI bleed 03/31/2013  . CHF (congestive heart failure)   . Osteoporosis   . Seasonal allergies   . Anxiety   . Abdominal bruit   . Chronic kidney disease     stage 3  . Sleep apnea     can't afford cpap  . Pneumonia   . GERD (gastroesophageal reflux disease)     from medications  . Complication of anesthesia     " after I got home from my last procedure, I started itching."  . Anemia     Past Surgical History  Procedure Laterality Date  . Abdominal hysterectomy  1993`  . Esophagogastroduodenoscopy N/A 03/31/2013    Procedure: ESOPHAGOGASTRODUODENOSCOPY (EGD);  Surgeon: Gatha Mayer, MD;  Location: [facility] ENDOSCOPY;  Service: Endoscopy;  Laterality: N/A;  . Eye surgery      laser surgery  . Bascilic  vein transposition Left 07/10/2014    Procedure: BASCILIC VEIN TRANSPOSITION;  Surgeon: Angelia Mould, MD;  Location: [facility];  Service: Vascular;  Laterality: Left;  . Fistulogram Left 10/29/2014    Procedure: FISTULOGRAM;  Surgeon: Angelia Mould, MD;  [facility] CATH LAB;  Service: Cardiovascular;  Laterality: Left;  . Bascilic vein transposition Right 11/08/2014    Procedure: FIRST STAGE BASILIC VEIN TRANSPOSITION;  Surgeon: Angelia Mould, MD;  Location: [facility];  Service: Vascular;  Laterality: Right;  . Bascilic vein transposition Right 01/18/2015    Procedure: SECOND STAGE BASILIC VEIN TRANSPOSITION;  Surgeon: Angelia Mould, MD;  Location: [facility];  Service: Vascular;  Laterality: Right;  . Patch angioplasty Right 01/18/2015    Procedure: BASILIC VEIN PATCH ANGIOPLASTY USING VASCUGUARD PATCH;  Surgeon: Angelia Mould, MD;  Location: [facility];  Service: Vascular;  Laterality: Right;       HPI  from the history and physical done on the day of admission:    Destiny Day is  a 64 y.o. 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 Patient states she went to bed around 1 AM and patient suddenly became short of breath and is finding it difficult to lie flat. Patient has been having chronic cough for last 2 months. Patient had 3-4 episodes of nausea vomiting after patient became short of breath. Patient states her vomiting was triggered by her coughing spells. In the ER patient was found to have elevated JVD with chest x-ray showing congestion. Patient has been admitted for CHF. Patient denies any chest pain fever chills. Denies any abdominal pain and abdomen appears benign on exam. Patient has received 60 mg of Lasix in the ER so far had 200 mL  output. Patient states she has been compliant with her torsemide.     Hospital Course:     Acute on chronic Diastolic CHF, EF [id number] on recent echocardiogram: Improved with intravenous Lasix-continue, clinically much improved, no shortness of breath, ambulated on room air and pulse ox stayed above 95% at all times, she will continue her home dose Demadex and I have ordered scheduled Zaroxolyn every 3 days 2.5 mg. Will request PCP to kindly monitor weight, BMP and aortic dose closely. We'll have her follow with cardiology outpatient.  Chronic kidney disease stage IV: Creatinine close to her usual baseline of 3.3. Follow closely while on Lasix. Follows with Dr. Everlean Alstrom has access in right arm. Requested to follow with her nephrologist within a week.  Essential hypertension: Continue amlodipine, clonidine, Coreg, BiDil-blood pressure reasonably controlled.  Bronchial Asthma: Appears stable-lungs clear. Continue bronchodilators. No wheezing on exam.  Dyslipidemia: Continue statin  GERD: Continue PPI   Nonspecific elevation of d-dimer ordered upon admission. She has no chest pain or tachycardia, clinically much improved with diuresis, exam consistent with acute on chronic diastolic CHF, negative lower extremity venous duplex, no tachycardia or shortness of breath at this time, ambulating on room air with pulse ox above 95% at all times.  Type 2 diabetes: CBGs stable, and a new home regimen and follow with PCP.   Nonspecific inguinal lymphadenopathy noted on lower extremity venous duplex ultrasound. Request PCP to do appropriate outpatient clinical workup.     Discharge Condition: stable  Follow UP  Follow-up Information    Follow up with Velna Hatchet, MD. Schedule an appointment as soon as possible for a visit in 3 days.   Specialty:  Internal Medicine   Contact information:   [place] [address] [phone]       Follow up with DUNHAM,CYNTHIA B, MD.  Schedule an appointment as soon as possible for a visit in 3 days.   Specialty:  Nephrology   Why:  BMP check   Contact information:   [place] [address] [phone]       Follow up with [facility]. Schedule an appointment as soon as possible for a visit in 1 week.   Why:  CHF   Contact information:   [address] [place] [phone]        Consults obtained - none  Diet and Activity recommendation: See Discharge Instructions below  Discharge Instructions       Discharge Instructions    Discharge instructions    Complete by:  As directed   Follow with Primary MD Velna Hatchet, MD in 3 days   Get CBC, CMP, 2 view Chest X ray checked  by Primary MD next visit.    Activity: As tolerated with Full fall precautions use walker/cane & assistance as needed   Disposition Home     Diet: Heart Healthy Low Carb,  Check your Weight same time everyday, if you gain over 2 pounds, or you develop in leg swelling, experience more shortness of breath or chest pain, call your Primary MD immediately. Follow Cardiac Low Salt Diet and 1.5 lit/day fluid restriction.   On your next visit with your primary care physician please Get Medicines reviewed and adjusted.   Please request your Prim.MD to go over all Hospital Tests and Procedure/Radiological results at the follow up, please get all Hospital records sent to your Prim MD by signing hospital release before you go home.   If you experience worsening of your admission symptoms, develop shortness of breath, life threatening emergency, suicidal or homicidal thoughts you must seek medical attention immediately by calling 911 or calling your MD immediately  if symptoms less severe.  You Must read complete instructions/literature along with all the possible adverse reactions/side effects for all the Medicines you take and that have been prescribed to you. Take any new Medicines after  you have completely understood and accpet all the possible adverse reactions/side effects.   Do not drive, operating heavy machinery, perform activities at heights, swimming or participation in water activities or provide baby sitting services if your were admitted for syncope or siezures until you have seen by Primary MD or a Neurologist and advised to do so again.  Do not drive when taking Pain medications.    Do not take more than prescribed Pain, Sleep and Anxiety Medications  Special Instructions: If you have smoked or chewed Tobacco  in the last 2 yrs please stop smoking, stop any regular Alcohol  and or any Recreational drug use.  Wear Seat belts while driving.   Please note  You were cared for by a hospitalist during your hospital stay. If you have any questions about your discharge medications or the care you received while you were in the hospital after you are discharged, you can call the unit and asked to Day with the hospitalist on call if the hospitalist that took care of you is not available. Once you are discharged, your primary care physician will handle any further medical issues. Please note that NO REFILLS for any discharge medications will be authorized once you are discharged, as it is imperative that you return to your primary care physician (or establish a relationship with a primary care physician if you do not have one) for your aftercare needs so that they can reassess your need for medications and monitor your lab values.     Increase activity slowly    Complete by:  As directed              Discharge Medications       Medication List    TAKE these medications        albuterol 108 (90 BASE) MCG/ACT inhaler  Commonly known as:  PROVENTIL HFA;VENTOLIN HFA  Inhale 1-2 puffs into the lungs every 6 (six) hours as needed for wheezing or shortness of breath.     amLODipine 10 MG tablet  Commonly known as:  NORVASC  Take 1 tablet (10 mg total) by mouth  daily.     aspirin  81 MG chewable tablet  Chew 81 mg by mouth daily.     atorvastatin 20 MG tablet  Commonly known as:  LIPITOR  Take 20 mg by mouth daily at 6 PM.     budesonide-formoterol 160-4.5 MCG/ACT inhaler  Commonly known as:  SYMBICORT  Inhale 1 puff into the lungs every evening.     calcitRIOL 0.25 MCG capsule  Commonly known as:  ROCALTROL  Take 0.25 mcg by mouth daily.     carvedilol 25 MG tablet  Commonly known as:  COREG  Take 1 tablet (25 mg total) by mouth 2 (two) times daily with a meal.     cloNIDine 0.1 MG tablet  Commonly known as:  CATAPRES  Take 1 tablet (0.1 mg total) by mouth 3 (three) times daily.     ferrous sulfate 325 (65 FE) MG tablet  Take 325 mg by mouth daily with breakfast.     insulin aspart 100 UNIT/ML injection  Commonly known as:  novoLOG  Inject 0-8 Units into the skin 3 (three) times daily before meals. Sliding scale     isosorbide-hydrALAZINE 20-37.5 MG per tablet  Commonly known as:  BIDIL  Take 2 tablets by mouth 3 (three) times daily.     LEVEMIR FLEXTOUCH 100 UNIT/ML Pen  Generic drug:  Insulin Detemir  Inject 10 Units into the muscle at bedtime.     loratadine 10 MG tablet  Commonly known as:  CLARITIN  Take 10 mg by mouth daily as needed for allergies.     metolazone 2.5 MG tablet  Commonly known as:  ZAROXOLYN  Take 1 tablet (2.5 mg total) by mouth every 3 (three) days.     multivitamin Tabs tablet  Take 1 tablet by mouth daily.     omeprazole 20 MG capsule  Commonly known as:  PRILOSEC  Take 20 mg by mouth daily.     potassium chloride SA 20 MEQ tablet  Commonly known as:  K-DUR,KLOR-CON  Take 1 tablet (20 mEq total) by mouth 2 (two) times daily.     torsemide 100 MG tablet  Commonly known as:  DEMADEX  Take 1 tablet (100 mg total) by mouth daily.     Vitamin D (Ergocalciferol) 50000 UNITS Caps capsule  Commonly known as:  DRISDOL  Take 50,000 Units by mouth every 7 (seven) days. Mondays        Major  procedures and Radiology Reports - PLEASE review detailed and final reports for all details, in brief -     Recent TTE Feb 2016  - Left ventricle: The cavity size was normal. There was moderateconcentric hypertrophy. Systolic function was normal. Theestimated ejection fraction was in the range of 50% to 55%. Wallmotion was normal; there were no regional wall motionabnormalities. Doppler parameters are consistent with arestrictive pattern, indicative of decreased left ventriculardiastolic compliance and/or increased left atrial pressure (grade3 diastolic dysfunction). Doppler parameters are consistent withboth elevated ventricular end-diastolic filling pressure andelevated left atrial filling pressure. - Left atrium: The atrium was mildly dilated. - Atrial septum: No defect or patent foramen ovale was identified.  Lower extremity venous duplex - Lower extremity venous duplex has been completed. Preliminary findings: negative for DVT. Right baker's cyst noted. Bilateral enlarged inguinal lymph nodes.    Dg Chest Port 1 View  03/15/2015   CLINICAL DATA:  Acute onset of shortness of breath and decreased O2 saturation. Vomiting. Initial encounter.  EXAM: PORTABLE CHEST - 1 VIEW  COMPARISON:  Chest radiograph performed 02/03/2015  FINDINGS:  The lungs are well-aerated. Vascular congestion is noted, with bibasilar airspace opacities, likely reflecting mild interstitial edema. No pleural effusion or pneumothorax is seen.  The cardiomediastinal silhouette is borderline normal in size. No acute osseous abnormalities are seen. There is chronic superior subluxation of both humeral heads.  IMPRESSION: Vascular congestion, with bibasilar airspace opacities, likely reflecting mild interstitial edema.   Electronically Signed   By: Garald Balding M.D.   On: 03/15/2015 03:04    Micro Results      Recent Results (from the past 240 hour(s))  MRSA PCR Screening     Status: None   Collection Time:  03/15/15  5:52 AM  Result Value Ref Range Status   MRSA by PCR NEGATIVE NEGATIVE Final    Comment:        The GeneXpert MRSA Assay (FDA approved for NASAL specimens only), is one component of a comprehensive MRSA colonization surveillance program. It is not intended to diagnose MRSA infection nor to guide or monitor treatment for MRSA infections.        Today   Subjective    Destiny Day today has no headache,no chest abdominal pain,no new weakness tingling or numbness, feels much better wants to go home today.     Objective   Blood pressure 135/60, pulse 73, temperature 97.9 F (36.6 C), temperature source Oral, resp. rate 18, height 5\' 5"  (1.651 m), weight 66.9 kg (147 lb 7.8 oz), SpO2 97 %.   Intake/Output Summary (Last 24 hours) at 03/17/15 1028 Last data filed at 03/17/15 0700  Gross per 24 hour  Intake    900 ml  Output   2200 ml  Net  -1300 ml    Exam Awake Alert, Oriented x 3, No new F.N deficits, Normal affect [place].AT,PERRAL Supple Neck,No JVD, No cervical lymphadenopathy appriciated.  Symmetrical Chest wall movement, Good air movement bilaterally, minimal to no rales RRR,No Gallops,Rubs or new Murmurs, No Parasternal Heave +ve B.Sounds, Abd Soft, Non tender, No organomegaly appriciated, No rebound -guarding or rigidity. No Cyanosis, Clubbing or edema, No new Rash or bruise   Data Review   CBC w Diff: Lab Results  Component Value Date   WBC 7.5 03/15/2015   HGB 11.3* 03/15/2015   HCT 34.1* 03/15/2015   PLT 251 03/15/2015   LYMPHOPCT 24 03/15/2015   MONOPCT 5 03/15/2015   EOSPCT 2 03/15/2015   BASOPCT 0 03/15/2015    CMP: Lab Results  Component Value Date   NA 135 03/17/2015   K 4.4 03/17/2015   CL 99* 03/17/2015   CO2 26 03/17/2015   BUN 68* 03/17/2015   CREATININE 3.41* 03/17/2015   PROT 6.6 03/15/2015   ALBUMIN 2.8* 03/15/2015   BILITOT 0.7 03/15/2015   ALKPHOS 159* 03/15/2015   AST 29 03/15/2015   ALT 28 03/15/2015  .  Lab  Results  Component Value Date   HGBA1C 11.0* 01/09/2015    Total Time in preparing paper work, data evaluation and todays exam - 35 minutes  Thurnell Lose M.D on 03/17/2015 at 10:28 AM  [facility]   Office  [phone]

## 2015-03-17 NOTE — Discharge Instructions (Signed)
Follow with Primary MD Velna Hatchet, MD in 3 days   Get CBC, CMP, 2 view Chest X ray checked  by Primary MD next visit.    Activity: As tolerated with Full fall precautions use walker/cane & assistance as needed   Disposition Home     Diet: Heart Healthy Low Carb,  Check your Weight same time everyday, if you gain over 2 pounds, or you develop in leg swelling, experience more shortness of breath or chest pain, call your Primary MD immediately. Follow Cardiac Low Salt Diet and 1.5 lit/day fluid restriction.   On your next visit with your primary care physician please Get Medicines reviewed and adjusted.   Please request your Prim.MD to go over all Hospital Tests and Procedure/Radiological results at the follow up, please get all Hospital records sent to your Prim MD by signing hospital release before you go home.   If you experience worsening of your admission symptoms, develop shortness of breath, life threatening emergency, suicidal or homicidal thoughts you must seek medical attention immediately by calling 911 or calling your MD immediately  if symptoms less severe.  You Must read complete instructions/literature along with all the possible adverse reactions/side effects for all the Medicines you take and that have been prescribed to you. Take any new Medicines after you have completely understood and accpet all the possible adverse reactions/side effects.   Do not drive, operating heavy machinery, perform activities at heights, swimming or participation in water activities or provide baby sitting services if your were admitted for syncope or siezures until you have seen by Primary MD or a Neurologist and advised to do so again.  Do not drive when taking Pain medications.    Do not take more than prescribed Pain, Sleep and Anxiety Medications  Special Instructions: If you have smoked or chewed Tobacco  in the last 2 yrs please stop smoking, stop any regular Alcohol  and or any  Recreational drug use.  Wear Seat belts while driving.   Please note  You were cared for by a hospitalist during your hospital stay. If you have any questions about your discharge medications or the care you received while you were in the hospital after you are discharged, you can call the unit and asked to speak with the hospitalist on call if the hospitalist that took care of you is not available. Once you are discharged, your primary care physician will handle any further medical issues. Please note that NO REFILLS for any discharge medications will be authorized once you are discharged, as it is imperative that you return to your primary care physician (or establish a relationship with a primary care physician if you do not have one) for your aftercare needs so that they can reassess your need for medications and monitor your lab values.

## 2015-03-21 ENCOUNTER — Encounter (HOSPITAL_COMMUNITY)
Admission: RE | Admit: 2015-03-21 | Discharge: 2015-03-21 | Disposition: A | Discharge status: Home / Self Care | Payer: BC Managed Care – PPO | Source: Ambulatory Visit | Attending: Nephrology | Admitting: Nephrology

## 2015-03-21 DIAGNOSIS — D631 Anemia in chronic kidney disease: Secondary | ICD-10-CM | POA: Diagnosis not present

## 2015-03-21 DIAGNOSIS — Z5181 Encounter for therapeutic drug level monitoring: Secondary | ICD-10-CM | POA: Diagnosis not present

## 2015-03-21 DIAGNOSIS — Z79899 Other long term (current) drug therapy: Secondary | ICD-10-CM | POA: Insufficient documentation

## 2015-03-21 DIAGNOSIS — N184 Chronic kidney disease, stage 4 (severe): Secondary | ICD-10-CM | POA: Diagnosis not present

## 2015-03-21 LAB — IRON AND TIBC
Iron: 62 ug/dL (ref 28–170)
Saturation Ratios: 24 % (ref 10.4–31.8)
TIBC: 258 ug/dL (ref 250–450)
UIBC: 196 ug/dL

## 2015-03-21 LAB — FERRITIN: Ferritin: 431 ng/mL — ABNORMAL HIGH (ref 11–307)

## 2015-03-21 LAB — POCT HEMOGLOBIN-HEMACUE: Hemoglobin: 11.5 g/dL — ABNORMAL LOW (ref 12.0–15.0)

## 2015-03-21 MED ORDER — DARBEPOETIN ALFA 100 MCG/0.5ML IJ SOSY
100.0000 ug | PREFILLED_SYRINGE | INTRAMUSCULAR | Status: DC
  Administered 2015-03-21: 100 ug via SUBCUTANEOUS

## 2015-03-21 MED ORDER — DARBEPOETIN ALFA 100 MCG/0.5ML IJ SOSY
PREFILLED_SYRINGE | INTRAMUSCULAR | Status: AC
Start: 2015-03-21 — End: 2015-03-21
  Filled 2015-03-21: qty 0.5

## 2015-04-18 ENCOUNTER — Encounter (HOSPITAL_COMMUNITY): Payer: BC Managed Care – PPO

## 2015-04-22 ENCOUNTER — Encounter (HOSPITAL_COMMUNITY)
Admission: RE | Admit: 2015-04-22 | Discharge: 2015-04-22 | Disposition: A | Discharge status: Home / Self Care | Payer: BC Managed Care – PPO | Source: Ambulatory Visit | Attending: Nephrology | Admitting: Nephrology

## 2015-04-22 DIAGNOSIS — N184 Chronic kidney disease, stage 4 (severe): Secondary | ICD-10-CM | POA: Diagnosis not present

## 2015-04-22 DIAGNOSIS — Z5181 Encounter for therapeutic drug level monitoring: Secondary | ICD-10-CM | POA: Diagnosis not present

## 2015-04-22 DIAGNOSIS — Z79899 Other long term (current) drug therapy: Secondary | ICD-10-CM | POA: Insufficient documentation

## 2015-04-22 DIAGNOSIS — D631 Anemia in chronic kidney disease: Secondary | ICD-10-CM | POA: Insufficient documentation

## 2015-04-22 LAB — POCT HEMOGLOBIN-HEMACUE: Hemoglobin: 13.6 g/dL (ref 12.0–15.0)

## 2015-04-22 LAB — IRON AND TIBC
Iron: 54 ug/dL (ref 28–170)
Saturation Ratios: 20 % (ref 10.4–31.8)
TIBC: 274 ug/dL (ref 250–450)
UIBC: 220 ug/dL

## 2015-04-22 LAB — FERRITIN: Ferritin: 318 ng/mL — ABNORMAL HIGH (ref 11–307)

## 2015-04-22 MED ORDER — DARBEPOETIN ALFA 100 MCG/0.5ML IJ SOSY: 100.0000 ug | PREFILLED_SYRINGE | INTRAMUSCULAR | Status: DC

## 2015-05-06 ENCOUNTER — Encounter (HOSPITAL_COMMUNITY)
Admission: RE | Admit: 2015-05-06 | Discharge: 2015-05-06 | Disposition: A | Discharge status: Home / Self Care | Payer: BC Managed Care – PPO | Source: Ambulatory Visit | Attending: Nephrology | Admitting: Nephrology

## 2015-05-06 DIAGNOSIS — Z79899 Other long term (current) drug therapy: Secondary | ICD-10-CM | POA: Insufficient documentation

## 2015-05-06 DIAGNOSIS — Z5181 Encounter for therapeutic drug level monitoring: Secondary | ICD-10-CM | POA: Insufficient documentation

## 2015-05-06 DIAGNOSIS — N184 Chronic kidney disease, stage 4 (severe): Secondary | ICD-10-CM

## 2015-05-06 DIAGNOSIS — D631 Anemia in chronic kidney disease: Secondary | ICD-10-CM | POA: Diagnosis not present

## 2015-05-06 LAB — POCT HEMOGLOBIN-HEMACUE: Hemoglobin: 13 g/dL (ref 12.0–15.0)

## 2015-05-06 MED ORDER — DARBEPOETIN ALFA 100 MCG/0.5ML IJ SOSY: 100.0000 ug | PREFILLED_SYRINGE | INTRAMUSCULAR | Status: DC

## 2015-05-09 ENCOUNTER — Ambulatory Visit: Payer: BC Managed Care – PPO | Admitting: Podiatry

## 2015-06-03 ENCOUNTER — Encounter (HOSPITAL_COMMUNITY): Payer: BC Managed Care – PPO

## 2015-06-11 ENCOUNTER — Ambulatory Visit (INDEPENDENT_AMBULATORY_CARE_PROVIDER_SITE_OTHER): Payer: BC Managed Care – PPO | Admitting: Podiatry

## 2015-06-11 ENCOUNTER — Encounter: Payer: Self-pay | Admitting: Podiatry

## 2015-06-11 VITALS — BP 85/46 | HR 83 | Resp 16

## 2015-06-11 DIAGNOSIS — B353 Tinea pedis: Secondary | ICD-10-CM | POA: Diagnosis not present

## 2015-06-11 DIAGNOSIS — B351 Tinea unguium: Secondary | ICD-10-CM

## 2015-06-11 DIAGNOSIS — M79676 Pain in unspecified toe(s): Secondary | ICD-10-CM | POA: Diagnosis not present

## 2015-06-11 MED ORDER — CLOTRIMAZOLE 1 % EX CREA: 1.0000 "application " | TOPICAL_CREAM | Freq: Two times a day (BID) | CUTANEOUS | Status: DC

## 2015-06-11 NOTE — Progress Notes (Signed)
   Subjective:    Patient ID: Destiny Day, female    DOB: [birth year], 64 y.o.   MRN: [id number]  HPI Comments:   Diabetic since 1993 and last A1C was 8.5 Patient has also been prepped for dialysis, but hasn't started yet.  Diabetes   she presents today with a chief complaint of painful elongated toenails 1 through 5 bilateral. She states that she is insulin-dependent diabetic currently in renal failure waiting to start dialysis. She states that she has been a diabetic since 1993 and her hemoglobin A1c is 8.5. She states that she is concerned because her hallux nails are growing over onto the second toe that are causing her toes to be sore.    Review of Systems  HENT: Positive for sinus pressure and sneezing.   Respiratory: Positive for cough.   Allergic/Immunologic: Positive for environmental allergies.  All other systems reviewed and are negative.      Objective:   Physical Exam: 64 year old black female presents today with nausea and vomiting stating that it has to do with her renal failure. She states that she has frequently forgot to take her medication. She feels much better after emesis. Vital signs are stable she is alert and oriented 3 pulses are palpable slightly decreased sensorium. Vibratory sensation is hypersensitive indicative of early peripheral neuropathy. Deep tendon reflexes are intact bilateral and muscle strength +5 over 5 dorsiflexion plantar flexors and inverters and everters all internal musculature is intact. Orthopedic evaluation does rate all joints distal to the ankle for range of motion without crepitation. Cutaneous evaluation of a straight supple well-hydrated cutis no erythema edema saline as drainage or odor. Hallux nails are excessively long irritating the second toe with postinflammatory hyperpigmentation to the second digit. I see no signs of infection other than onychomycosis. She might have a very slight tinea pedis bilateral with some scaling to  the plantar aspect of the bilateral foot in a moccasin type distribution.        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 Tinea pedis bilateral.  Plan: Discussed etiology pathology conservative versus surgical therapies. Debridement nails 1 through 5 bilateral. We filled out paperwork to send for diabetic shoes. I will follow-up with her in 3 months. Also wrote prescription for Spectazole cream to be applied twice a day.

## 2015-06-12 ENCOUNTER — Encounter (HOSPITAL_COMMUNITY): Payer: Self-pay

## 2015-06-12 ENCOUNTER — Emergency Department (HOSPITAL_COMMUNITY): Payer: BC Managed Care – PPO

## 2015-06-12 ENCOUNTER — Inpatient Hospital Stay (HOSPITAL_COMMUNITY)
Admission: EM | Admit: 2015-06-12 | Discharge: 2015-06-14 | DRG: 948 | Disposition: A | Discharge status: Home / Self Care | Payer: BC Managed Care – PPO | Attending: Internal Medicine | Admitting: Internal Medicine

## 2015-06-12 ENCOUNTER — Observation Stay (HOSPITAL_COMMUNITY): Payer: BC Managed Care – PPO

## 2015-06-12 DIAGNOSIS — E1121 Type 2 diabetes mellitus with diabetic nephropathy: Secondary | ICD-10-CM | POA: Diagnosis present

## 2015-06-12 DIAGNOSIS — R079 Chest pain, unspecified: Secondary | ICD-10-CM

## 2015-06-12 DIAGNOSIS — I10 Essential (primary) hypertension: Secondary | ICD-10-CM | POA: Diagnosis present

## 2015-06-12 DIAGNOSIS — G4730 Sleep apnea, unspecified: Secondary | ICD-10-CM | POA: Diagnosis present

## 2015-06-12 DIAGNOSIS — E785 Hyperlipidemia, unspecified: Secondary | ICD-10-CM | POA: Diagnosis present

## 2015-06-12 DIAGNOSIS — N189 Chronic kidney disease, unspecified: Secondary | ICD-10-CM

## 2015-06-12 DIAGNOSIS — I5033 Acute on chronic diastolic (congestive) heart failure: Secondary | ICD-10-CM | POA: Diagnosis present

## 2015-06-12 DIAGNOSIS — Z7982 Long term (current) use of aspirin: Secondary | ICD-10-CM

## 2015-06-12 DIAGNOSIS — N183 Chronic kidney disease, stage 3 unspecified: Secondary | ICD-10-CM | POA: Diagnosis present

## 2015-06-12 DIAGNOSIS — E1142 Type 2 diabetes mellitus with diabetic polyneuropathy: Secondary | ICD-10-CM | POA: Diagnosis present

## 2015-06-12 DIAGNOSIS — N390 Urinary tract infection, site not specified: Secondary | ICD-10-CM | POA: Diagnosis present

## 2015-06-12 DIAGNOSIS — Z87891 Personal history of nicotine dependence: Secondary | ICD-10-CM

## 2015-06-12 DIAGNOSIS — N2581 Secondary hyperparathyroidism of renal origin: Secondary | ICD-10-CM | POA: Diagnosis present

## 2015-06-12 DIAGNOSIS — J45909 Unspecified asthma, uncomplicated: Secondary | ICD-10-CM | POA: Diagnosis present

## 2015-06-12 DIAGNOSIS — R05 Cough: Secondary | ICD-10-CM | POA: Diagnosis not present

## 2015-06-12 DIAGNOSIS — E871 Hypo-osmolality and hyponatremia: Secondary | ICD-10-CM | POA: Diagnosis present

## 2015-06-12 DIAGNOSIS — R680 Hypothermia, not associated with low environmental temperature: Principal | ICD-10-CM | POA: Diagnosis present

## 2015-06-12 DIAGNOSIS — N185 Chronic kidney disease, stage 5: Secondary | ICD-10-CM | POA: Diagnosis present

## 2015-06-12 DIAGNOSIS — E860 Dehydration: Secondary | ICD-10-CM | POA: Diagnosis present

## 2015-06-12 DIAGNOSIS — N179 Acute kidney failure, unspecified: Secondary | ICD-10-CM | POA: Diagnosis not present

## 2015-06-12 DIAGNOSIS — T68XXXA Hypothermia, initial encounter: Secondary | ICD-10-CM | POA: Diagnosis not present

## 2015-06-12 DIAGNOSIS — F419 Anxiety disorder, unspecified: Secondary | ICD-10-CM | POA: Diagnosis present

## 2015-06-12 DIAGNOSIS — D649 Anemia, unspecified: Secondary | ICD-10-CM | POA: Diagnosis present

## 2015-06-12 DIAGNOSIS — N186 End stage renal disease: Secondary | ICD-10-CM | POA: Diagnosis present

## 2015-06-12 DIAGNOSIS — M810 Age-related osteoporosis without current pathological fracture: Secondary | ICD-10-CM | POA: Diagnosis present

## 2015-06-12 DIAGNOSIS — K219 Gastro-esophageal reflux disease without esophagitis: Secondary | ICD-10-CM | POA: Diagnosis present

## 2015-06-12 DIAGNOSIS — Z794 Long term (current) use of insulin: Secondary | ICD-10-CM

## 2015-06-12 DIAGNOSIS — I5032 Chronic diastolic (congestive) heart failure: Secondary | ICD-10-CM | POA: Diagnosis present

## 2015-06-12 DIAGNOSIS — I132 Hypertensive heart and chronic kidney disease with heart failure and with stage 5 chronic kidney disease, or end stage renal disease: Secondary | ICD-10-CM | POA: Diagnosis present

## 2015-06-12 DIAGNOSIS — I959 Hypotension, unspecified: Secondary | ICD-10-CM | POA: Diagnosis present

## 2015-06-12 DIAGNOSIS — E1122 Type 2 diabetes mellitus with diabetic chronic kidney disease: Secondary | ICD-10-CM | POA: Diagnosis present

## 2015-06-12 DIAGNOSIS — M13869 Other specified arthritis, unspecified knee: Secondary | ICD-10-CM | POA: Diagnosis present

## 2015-06-12 DIAGNOSIS — Z79899 Other long term (current) drug therapy: Secondary | ICD-10-CM

## 2015-06-12 LAB — URINE MICROSCOPIC-ADD ON: Bacteria, UA: NONE SEEN

## 2015-06-12 LAB — CBC WITH DIFFERENTIAL/PLATELET
Basophils Absolute: 0 10*3/uL (ref 0.0–0.1)
Basophils Relative: 0 %
Eosinophils Absolute: 0.1 10*3/uL (ref 0.0–0.7)
Eosinophils Relative: 2 %
HCT: 38.2 % (ref 36.0–46.0)
Hemoglobin: 12.6 g/dL (ref 12.0–15.0)
Lymphocytes Relative: 27 %
Lymphs Abs: 1.9 10*3/uL (ref 0.7–4.0)
MCH: 29.3 pg (ref 26.0–34.0)
MCHC: 33 g/dL (ref 30.0–36.0)
MCV: 88.8 fL (ref 78.0–100.0)
Monocytes Absolute: 0.4 10*3/uL (ref 0.1–1.0)
Monocytes Relative: 6 %
Neutro Abs: 4.7 10*3/uL (ref 1.7–7.7)
Neutrophils Relative %: 65 %
Platelets: 297 10*3/uL (ref 150–400)
RBC: 4.3 MIL/uL (ref 3.87–5.11)
RDW: 13.3 % (ref 11.5–15.5)
WBC: 7.2 10*3/uL (ref 4.0–10.5)

## 2015-06-12 LAB — COMPREHENSIVE METABOLIC PANEL
ALT: 19 U/L (ref 14–54)
AST: 23 U/L (ref 15–41)
Albumin: 3 g/dL — ABNORMAL LOW (ref 3.5–5.0)
Alkaline Phosphatase: 117 U/L (ref 38–126)
Anion gap: 15 (ref 5–15)
BUN: 78 mg/dL — ABNORMAL HIGH (ref 6–20)
CO2: 31 mmol/L (ref 22–32)
Calcium: 8.8 mg/dL — ABNORMAL LOW (ref 8.9–10.3)
Chloride: 88 mmol/L — ABNORMAL LOW (ref 101–111)
Creatinine, Ser: 5.12 mg/dL — ABNORMAL HIGH (ref 0.44–1.00)
GFR calc Af Amer: 9 mL/min — ABNORMAL LOW (ref >60)
GFR calc non Af Amer: 8 mL/min — ABNORMAL LOW (ref >60)
Glucose, Bld: 129 mg/dL — ABNORMAL HIGH (ref 65–99)
Potassium: 2.9 mmol/L — ABNORMAL LOW (ref 3.5–5.1)
Sodium: 134 mmol/L — ABNORMAL LOW (ref 135–145)
Total Bilirubin: 0.7 mg/dL (ref 0.3–1.2)
Total Protein: 7.1 g/dL (ref 6.5–8.1)

## 2015-06-12 LAB — URINALYSIS, ROUTINE W REFLEX MICROSCOPIC
Bilirubin Urine: NEGATIVE
Glucose, UA: 250 mg/dL — AB
Hgb urine dipstick: NEGATIVE
Ketones, ur: NEGATIVE mg/dL
Leukocytes, UA: NEGATIVE
Nitrite: NEGATIVE
Protein, ur: >300 mg/dL — AB
Specific Gravity, Urine: 1.016 (ref 1.005–1.030)
pH: 6 (ref 5.0–8.0)

## 2015-06-12 LAB — GLUCOSE, CAPILLARY: Glucose-Capillary: 384 mg/dL — ABNORMAL HIGH (ref 65–99)

## 2015-06-12 LAB — INFLUENZA PANEL BY PCR (TYPE A & B)
H1N1 flu by pcr: NOT DETECTED
Influenza A By PCR: NEGATIVE
Influenza B By PCR: NEGATIVE

## 2015-06-12 LAB — I-STAT CG4 LACTIC ACID, ED: Lactic Acid, Venous: 1.47 mmol/L (ref 0.5–2.0)

## 2015-06-12 LAB — I-STAT TROPONIN, ED: Troponin i, poc: 0.04 ng/mL (ref 0.00–0.08)

## 2015-06-12 LAB — TSH: TSH: 0.818 u[IU]/mL (ref 0.350–4.500)

## 2015-06-12 LAB — TROPONIN I: Troponin I: 0.05 ng/mL — ABNORMAL HIGH (ref <0.031)

## 2015-06-12 MED ORDER — FERROUS SULFATE 325 (65 FE) MG PO TABS
325.0000 mg | ORAL_TABLET | Freq: Every day | ORAL | Status: DC
  Administered 2015-06-13 – 2015-06-14 (×2): 325 mg via ORAL
  Filled 2015-06-12 (×2): qty 1

## 2015-06-12 MED ORDER — PANTOPRAZOLE SODIUM 40 MG PO TBEC
40.0000 mg | DELAYED_RELEASE_TABLET | Freq: Every day | ORAL | Status: DC
  Administered 2015-06-12 – 2015-06-14 (×3): 40 mg via ORAL
  Filled 2015-06-12 (×3): qty 1

## 2015-06-12 MED ORDER — ONDANSETRON HCL 4 MG/2ML IJ SOLN
4.0000 mg | Freq: Four times a day (QID) | INTRAMUSCULAR | Status: DC | PRN
  Administered 2015-06-14 (×2): 4 mg via INTRAVENOUS
  Filled 2015-06-12 (×2): qty 2

## 2015-06-12 MED ORDER — POTASSIUM CHLORIDE CRYS ER 20 MEQ PO TBCR
40.0000 meq | EXTENDED_RELEASE_TABLET | Freq: Once | ORAL | Status: AC
  Administered 2015-06-12: 40 meq via ORAL
  Filled 2015-06-12: qty 2

## 2015-06-12 MED ORDER — CARVEDILOL 25 MG PO TABS
25.0000 mg | ORAL_TABLET | Freq: Two times a day (BID) | ORAL | Status: DC
  Administered 2015-06-13: 25 mg via ORAL
  Filled 2015-06-12: qty 1

## 2015-06-12 MED ORDER — ACETAMINOPHEN 500 MG PO TABS
1000.0000 mg | ORAL_TABLET | Freq: Once | ORAL | Status: AC
Start: 2015-06-12 — End: 2015-06-12
  Administered 2015-06-12: 1000 mg via ORAL
  Filled 2015-06-12: qty 2

## 2015-06-12 MED ORDER — HYDROCOD POLST-CPM POLST ER 10-8 MG/5ML PO SUER
5.0000 mL | Freq: Two times a day (BID) | ORAL | Status: DC | PRN
  Administered 2015-06-12 – 2015-06-13 (×2): 5 mL via ORAL
  Filled 2015-06-12 (×2): qty 5

## 2015-06-12 MED ORDER — ACETAMINOPHEN 325 MG PO TABS: 650.0000 mg | ORAL_TABLET | Freq: Four times a day (QID) | ORAL | Status: DC | PRN

## 2015-06-12 MED ORDER — CALCITRIOL 0.25 MCG PO CAPS
0.2500 ug | ORAL_CAPSULE | Freq: Every day | ORAL | Status: DC
  Administered 2015-06-13 – 2015-06-14 (×3): 0.25 ug via ORAL
  Filled 2015-06-12 (×3): qty 1

## 2015-06-12 MED ORDER — ATORVASTATIN CALCIUM 20 MG PO TABS
20.0000 mg | ORAL_TABLET | Freq: Every day | ORAL | Status: DC
  Administered 2015-06-13: 20 mg via ORAL
  Filled 2015-06-12: qty 1

## 2015-06-12 MED ORDER — SODIUM CHLORIDE 0.9 % IJ SOLN
3.0000 mL | Freq: Two times a day (BID) | INTRAMUSCULAR | Status: DC
  Administered 2015-06-12 – 2015-06-14 (×3): 3 mL via INTRAVENOUS

## 2015-06-12 MED ORDER — TECHNETIUM TC 99M DIETHYLENETRIAME-PENTAACETIC ACID: 31.7000 | Freq: Once | INTRAVENOUS | Status: DC | PRN

## 2015-06-12 MED ORDER — DEXTROMETHORPHAN POLISTIREX ER 30 MG/5ML PO SUER
30.0000 mg | Freq: Two times a day (BID) | ORAL | Status: DC | PRN
  Administered 2015-06-13: 30 mg via ORAL
  Filled 2015-06-12 (×2): qty 5

## 2015-06-12 MED ORDER — ACETAMINOPHEN 650 MG RE SUPP: 650.0000 mg | Freq: Four times a day (QID) | RECTAL | Status: DC | PRN

## 2015-06-12 MED ORDER — HEPARIN SODIUM (PORCINE) 5000 UNIT/ML IJ SOLN
5000.0000 [IU] | Freq: Three times a day (TID) | INTRAMUSCULAR | Status: DC
  Administered 2015-06-12 – 2015-06-14 (×5): 5000 [IU] via SUBCUTANEOUS
  Filled 2015-06-12 (×5): qty 1

## 2015-06-12 MED ORDER — ASPIRIN 81 MG PO CHEW
81.0000 mg | CHEWABLE_TABLET | Freq: Every day | ORAL | Status: DC
  Administered 2015-06-12 – 2015-06-14 (×3): 81 mg via ORAL
  Filled 2015-06-12 (×3): qty 1

## 2015-06-12 MED ORDER — ISOSORB DINITRATE-HYDRALAZINE 20-37.5 MG PO TABS
2.0000 | ORAL_TABLET | Freq: Three times a day (TID) | ORAL | Status: DC
  Filled 2015-06-12 (×2): qty 2

## 2015-06-12 MED ORDER — BUDESONIDE-FORMOTEROL FUMARATE 160-4.5 MCG/ACT IN AERO
1.0000 | INHALATION_SPRAY | Freq: Every evening | RESPIRATORY_TRACT | Status: DC
  Administered 2015-06-13: 1 via RESPIRATORY_TRACT
  Filled 2015-06-12: qty 6

## 2015-06-12 MED ORDER — TECHNETIUM TO 99M ALBUMIN AGGREGATED
4.3300 | Freq: Once | INTRAVENOUS | Status: AC | PRN
  Administered 2015-06-12: 4 via INTRAVENOUS

## 2015-06-12 MED ORDER — VITAMIN B-1 100 MG PO TABS
100.0000 mg | ORAL_TABLET | Freq: Every day | ORAL | Status: DC
  Administered 2015-06-12 – 2015-06-14 (×3): 100 mg via ORAL
  Filled 2015-06-12 (×3): qty 1

## 2015-06-12 MED ORDER — CLONIDINE HCL 0.1 MG PO TABS
0.1000 mg | ORAL_TABLET | Freq: Three times a day (TID) | ORAL | Status: DC
  Filled 2015-06-12 (×2): qty 1

## 2015-06-12 MED ORDER — INSULIN DETEMIR 100 UNIT/ML ~~LOC~~ SOLN
5.0000 [IU] | Freq: Every day | SUBCUTANEOUS | Status: DC
  Administered 2015-06-12 – 2015-06-13 (×2): 5 [IU] via SUBCUTANEOUS
  Filled 2015-06-12 (×3): qty 0.05

## 2015-06-12 MED ORDER — INSULIN ASPART 100 UNIT/ML ~~LOC~~ SOLN
0.0000 [IU] | Freq: Three times a day (TID) | SUBCUTANEOUS | Status: DC
  Administered 2015-06-13 (×2): 3 [IU] via SUBCUTANEOUS
  Administered 2015-06-13: 5 [IU] via SUBCUTANEOUS
  Administered 2015-06-14: 1 [IU] via SUBCUTANEOUS

## 2015-06-12 MED ORDER — ONDANSETRON HCL 4 MG PO TABS: 4.0000 mg | ORAL_TABLET | Freq: Four times a day (QID) | ORAL | Status: DC | PRN

## 2015-06-12 MED ORDER — AMLODIPINE BESYLATE 10 MG PO TABS
10.0000 mg | ORAL_TABLET | Freq: Every day | ORAL | Status: DC
  Filled 2015-06-12: qty 1

## 2015-06-12 MED ORDER — SODIUM CHLORIDE 0.45 % IV SOLN
INTRAVENOUS | Status: DC
  Administered 2015-06-12: 1000 mL via INTRAVENOUS

## 2015-06-12 NOTE — ED Notes (Signed)
Pt. Attempted to use bedpan. Pt. Unable to void at this time. Will try again.

## 2015-06-12 NOTE — ED Notes (Signed)
Pt. Presents from PCP office with complaint of fatigue and N/V. Pt. Does have stage 4 renal disease. Pt. Has new graft to R side, no use yet. BP 94/50 at PCP. CBG 118. Pt. PCP found K 2.7, BUN 78, Creatinine 5.0

## 2015-06-12 NOTE — H&P (Signed)
[facility] History and Physical  Destiny Day [id number] DOB: 01/31/1951 DOA: 06/12/2015   PCP: Velna Hatchet, MD  Specialists: Followed by Dr. Lorrene Reid who is her nephrologist  Chief Complaint: Cough and left-sided chest pain  HPI: Destiny Day is a 64 y.o. female with a past medical history of chronic kidney disease, hypertension, diabetes, who was in her usual state of health till a few weeks ago when she started coughing. The symptoms were on and off. She denies any yellowish expectoration. No blood in the sputum. No fever. And then over the past 2-3 days she's noticed pain in the left side of her chest radiating to the arm. The pain gets worse with movement. Denies any falls or injuries. She denies any leg swelling. No nausea, vomiting. No abdominal pain. No dizziness or lightheadedness. No exposure to cold recently. No recent changes to her medications. She has noticed poor appetite in the last few days. In the emergency department she was found to have a temperature 42F. She is also noted to have acute on chronic kidney disease. Medicine was consulted for further management.  Home Medications: Prior to Admission medications   Medication Sig Start Date End Date Taking? Authorizing Provider  albuterol (PROVENTIL HFA;VENTOLIN HFA) 108 (90 BASE) MCG/ACT inhaler Inhale 1-2 puffs into the lungs every 6 (six) hours as needed for wheezing or shortness of breath.   Yes Historical Provider, MD  amLODipine (NORVASC) 10 MG tablet Take 1 tablet (10 mg total) by mouth daily. 07/23/13  Yes Velvet Bathe, MD  aspirin 81 MG chewable tablet Chew 81 mg by mouth daily.   Yes Historical Provider, MD  atorvastatin (LIPITOR) 20 MG tablet Take 20 mg by mouth daily at 6 PM.   Yes Historical Provider, MD  budesonide-formoterol (SYMBICORT) 160-4.5 MCG/ACT inhaler Inhale 1 puff into the lungs every evening.   Yes Historical Provider, MD  calcitRIOL (ROCALTROL) 0.25 MCG capsule Take 0.25 mcg  by mouth daily.   Yes Historical Provider, MD  carvedilol (COREG) 25 MG tablet Take 1 tablet (25 mg total) by mouth 2 (two) times daily with a meal. 06/01/13  Yes Costin Karlyne Greenspan, MD  cloNIDine (CATAPRES) 0.1 MG tablet Take 1 tablet (0.1 mg total) by mouth 3 (three) times daily. 09/05/14  Yes Velna Hatchet, MD  clotrimazole (LOTRIMIN) 1 % cream Apply 1 application topically 2 (two) times daily. 06/11/15  Yes Max T Hyatt, DPM  ferrous sulfate 325 (65 FE) MG tablet Take 325 mg by mouth daily with breakfast.   Yes Historical Provider, MD  insulin aspart (NOVOLOG) 100 UNIT/ML injection Inject 0-8 Units into the skin 3 (three) times daily before meals. Sliding scale   Yes Historical Provider, MD  isosorbide-hydrALAZINE (BIDIL) 20-37.5 MG per tablet Take 2 tablets by mouth 3 (three) times daily. 01/12/14  Yes Barton Dubois, MD  LEVEMIR FLEXTOUCH 100 UNIT/ML Pen Inject 10 Units into the muscle at bedtime.  01/16/15  Yes Historical Provider, MD  loratadine (CLARITIN) 10 MG tablet Take 10 mg by mouth daily as needed for allergies.   Yes Historical Provider, MD  multivitamin (RENA-VIT) TABS tablet Take 1 tablet by mouth daily. 01/24/15  Yes Historical Provider, MD  omeprazole (PRILOSEC) 20 MG capsule Take 20 mg by mouth daily.   Yes Historical Provider, MD  potassium chloride SA (K-DUR,KLOR-CON) 20 MEQ tablet Take 1 tablet (20 mEq total) by mouth 2 (two) times daily. 12/10/14  Yes Linton Flemings, MD  torsemide (DEMADEX) 100 MG tablet Take 1 tablet (100  mg total) by mouth daily. 09/05/14  Yes Velna Hatchet, MD  Vitamin D, Ergocalciferol, (DRISDOL) 50000 UNITS CAPS capsule Take 50,000 Units by mouth every 7 (seven) days. Mondays   Yes Historical Provider, MD  metolazone (ZAROXOLYN) 2.5 MG tablet Take 1 tablet (2.5 mg total) by mouth every 3 (three) days. 03/17/15   Thurnell Lose, MD    Allergies:  Allergies  Allergen Reactions  . Lisinopril Cough  . Prednisone Other (See Comments)    Excessive fluid buildup     Past Medical History: Past Medical History  Diagnosis Date  . Hypertension   . Diverticulitis   . Hyperlipidemia   . Asthma   . Diabetes mellitus     insulin dependent  . Arthritis     knee  . GI bleed 03/31/2013  . CHF (congestive heart failure) ([place])   . Osteoporosis   . Seasonal allergies   . Anxiety   . Abdominal bruit   . Chronic kidney disease     stage 3  . Sleep apnea     can't afford cpap  . Pneumonia   . GERD (gastroesophageal reflux disease)     from medications  . Complication of anesthesia     " after I got home from my last procedure, I started itching."  . Anemia     Past Surgical History  Procedure Laterality Date  . Abdominal hysterectomy  1993`  . Esophagogastroduodenoscopy N/A 03/31/2013    Procedure: ESOPHAGOGASTRODUODENOSCOPY (EGD);  Surgeon: Gatha Mayer, MD;  Location[facility] ENDOSCOPY;  Service: Endoscopy;  Laterality: N/A;  . Eye surgery      laser surgery  . Bascilic vein transposition Left 07/10/2014    Procedure: BASCILIC VEIN TRANSPOSITION;  Surgeon: Angelia Mould, MD;  Location: [place];  Service: Vascular;  Laterality: Left;  . Fistulogram Left 10/29/2014    Procedure: FISTULOGRAM;  Surgeon: Angelia Mould, MD;  Location: [facility] CATH LAB;  Service: Cardiovascular;  Laterality: Left;  . Bascilic vein transposition Right 11/08/2014    Procedure: FIRST STAGE BASILIC VEIN TRANSPOSITION;  Surgeon: Angelia Mould, MD;  Location: [place];  Service: Vascular;  Laterality: Right;  . Bascilic vein transposition Right 01/18/2015    Procedure: SECOND STAGE BASILIC VEIN TRANSPOSITION;  Surgeon: Angelia Mould, MD;  Location: [place];  Service: Vascular;  Laterality: Right;  . Patch angioplasty Right 01/18/2015    Procedure: BASILIC VEIN PATCH ANGIOPLASTY USING VASCUGUARD PATCH;  Surgeon: Angelia Mould, MD;  Location: [facility] OR;  Service: Vascular;  Laterality: Right;    Social History: She lives in [place] with her family.  Denies smoking, alcohol use or illicit drug use. Uses a cane or a walker to ambulate.  Family History:  Family History  Problem Relation Age of Onset  . Colon cancer Neg Hx   . Other Mother     not sure of cause of death  . Diabetes Father   . Pancreatic cancer Maternal Grandmother      Review of Systems - History obtained from the patient General ROS: negative Psychological ROS: negative Ophthalmic ROS: negative ENT ROS: negative Allergy and Immunology ROS: negative Hematological and Lymphatic ROS: negative Endocrine ROS: negative Respiratory ROS: as in hpi Cardiovascular ROS: as in hpi Gastrointestinal ROS: no abdominal pain, change in bowel habits, or black or bloody stools Genito-Urinary ROS: no dysuria, trouble voiding, or hematuria Musculoskeletal ROS: negative Neurological ROS: no TIA or stroke symptoms Dermatological ROS: negative  Physical Examination  Filed Vitals:  06/12/15 1700 06/12/15 1745 06/12/15 1830 06/12/15 1839  BP: 122/58 118/53 150/68   Pulse: 71 77 80   Temp:    96.6 F (35.9 C)  TempSrc:    Rectal  Resp:  12 20   SpO2: 98% 98% 96%     BP 150/68 mmHg  Pulse 80  Temp(Src) 96.6 F (35.9 C) (Rectal)  Resp 20  SpO2 96%  General appearance: alert, cooperative, appears stated age and no distress Head: Normocephalic, without obvious abnormality, atraumatic Eyes: conjunctivae/corneas clear. PERRL, EOM's intact.  Throat: lips, mucosa, and tongue normal; teeth and gums normal Neck: no adenopathy, no carotid bruit, no JVD, supple, symmetrical, trachea midline and thyroid not enlarged, symmetric, no tenderness/mass/nodules Resp: clear to auscultation bilaterally Cardio: S1, S2 is normal, regular. No S3, S4. No rubs, murmurs, or bruit. Left side of the chest is tender to palpation in the area that she points towards. No rashes noted. No lesions are appreciated. GI: soft, non-tender; bowel sounds normal; no masses,  no organomegaly Extremities:  extremities normal, atraumatic, no cyanosis or edema Pulses: 2+ and symmetric Skin: Skin color, texture, turgor normal. No rashes or lesions Lymph nodes: Cervical, supraclavicular, and axillary nodes normal. Neurologic: Alert and oriented 3. No focal neurological deficits are noted.  Laboratory Data: Results for orders placed or performed during the hospital encounter of 06/12/15 (from the past 48 hour(s))  CBC with Differential     Status: None   Collection Time: 06/12/15  2:15 PM  Result Value Ref Range   WBC 7.2 4.0 - 10.5 K/uL   RBC 4.30 3.87 - 5.11 MIL/uL   Hemoglobin 12.6 12.0 - 15.0 g/dL   HCT 38.2 36.0 - 46.0 %   MCV 88.8 78.0 - 100.0 fL   MCH 29.3 26.0 - 34.0 pg   MCHC 33.0 30.0 - 36.0 g/dL   RDW 13.3 11.5 - 15.5 %   Platelets 297 150 - 400 K/uL   Neutrophils Relative % 65 %   Neutro Abs 4.7 1.7 - 7.7 K/uL   Lymphocytes Relative 27 %   Lymphs Abs 1.9 0.7 - 4.0 K/uL   Monocytes Relative 6 %   Monocytes Absolute 0.4 0.1 - 1.0 K/uL   Eosinophils Relative 2 %   Eosinophils Absolute 0.1 0.0 - 0.7 K/uL   Basophils Relative 0 %   Basophils Absolute 0.0 0.0 - 0.1 K/uL  I-stat troponin, ED     Status: None   Collection Time: 06/12/15  2:38 PM  Result Value Ref Range   Troponin i, poc 0.04 0.00 - 0.08 ng/mL   Comment 3            Comment: Due to the release kinetics of cTnI, a negative result within the first hours of the onset of symptoms does not rule out myocardial infarction with certainty. If myocardial infarction is still suspected, repeat the test at appropriate intervals.   I-Stat CG4 Lactic Acid, ED  (not at [facility])     Status: None   Collection Time: 06/12/15  2:40 PM  Result Value Ref Range   Lactic Acid, Venous 1.47 0.5 - 2.0 mmol/L  TSH     Status: None   Collection Time: 06/12/15  4:13 PM  Result Value Ref Range   TSH 0.818 0.350 - 4.500 uIU/mL  Comprehensive metabolic panel     Status: Abnormal   Collection Time: 06/12/15  4:13 PM  Result Value Ref  Range   Sodium 134 (L) 135 - 145 mmol/L  Potassium 2.9 (L) 3.5 - 5.1 mmol/L   Chloride 88 (L) 101 - 111 mmol/L   CO2 31 22 - 32 mmol/L   Glucose, Bld 129 (H) 65 - 99 mg/dL   BUN 78 (H) 6 - 20 mg/dL   Creatinine, Ser 5.12 (H) 0.44 - 1.00 mg/dL   Calcium 8.8 (L) 8.9 - 10.3 mg/dL   Total Protein 7.1 6.5 - 8.1 g/dL   Albumin 3.0 (L) 3.5 - 5.0 g/dL   AST 23 15 - 41 U/L   ALT 19 14 - 54 U/L   Alkaline Phosphatase 117 38 - 126 U/L   Total Bilirubin 0.7 0.3 - 1.2 mg/dL   GFR calc non Af Amer 8 (L) >60 mL/min   GFR calc Af Amer 9 (L) >60 mL/min    Comment: (NOTE) The eGFR has been calculated using the CKD EPI equation. This calculation has not been validated in all clinical situations. eGFR's persistently <60 mL/min signify possible Chronic Kidney Disease.    Anion gap 15 5 - 15  Urinalysis, Routine w reflex microscopic     Status: Abnormal   Collection Time: 06/12/15  4:22 PM  Result Value Ref Range   Color, Urine YELLOW YELLOW   APPearance CLEAR CLEAR   Specific Gravity, Urine 1.016 1.005 - 1.030   pH 6.0 5.0 - 8.0   Glucose, UA 250 (A) NEGATIVE mg/dL   Hgb urine dipstick NEGATIVE NEGATIVE   Bilirubin Urine NEGATIVE NEGATIVE   Ketones, ur NEGATIVE NEGATIVE mg/dL   Protein, ur >300 (A) NEGATIVE mg/dL   Nitrite NEGATIVE NEGATIVE   Leukocytes, UA NEGATIVE NEGATIVE  Urine microscopic-add on     Status: Abnormal   Collection Time: 06/12/15  4:22 PM  Result Value Ref Range   Squamous Epithelial / LPF 6-30 (A) NONE SEEN    Comment: Please note change in reference range.   WBC, UA 0-5 0 - 5 WBC/hpf    Comment: Please note change in reference range.   RBC / HPF 0-5 0 - 5 RBC/hpf    Comment: Please note change in reference range.   Bacteria, UA NONE SEEN NONE SEEN    Comment: Please note change in reference range.   Casts HYALINE CASTS (A) NEGATIVE    Radiology Reports: Dg Chest Portable 1 View  06/12/2015  CLINICAL DATA:  Chest pain on the left for 2 days, cough EXAM: PORTABLE  CHEST - 1 VIEW COMPARISON:  03/15/2015 FINDINGS: Cardiac shadow is within normal limits. The lungs are well aerated bilaterally. No focal infiltrate or sizable effusion is seen. Aortic calcifications are again noted. IMPRESSION: No acute abnormality noted. Electronically Signed   By: Inez Catalina M.D.   On: 06/12/2015 14:59    My interpretation of Electrocardiogram: EKG shows sinus rhythm in the 60s. Normal axis. QT interval is noted to be prolonged. T-wave inversion noted in the anterolateral leads. Some of these changes are new compared to previous EKG.  Problem List  Principal Problem:   Hypothermia Active Problems:   Essential hypertension   Chronic diastolic CHF (congestive heart failure) (HCC)   Type 2 diabetes mellitus with diabetic nephropathy (HCC)   CKD (chronic kidney disease) stage 3, GFR 30-59 ml/min   Acute on chronic renal failure (HCC)   Assessment: This is 64 year old African-American female with past medical history as stated earlier, who presents with nonspecific complaints and is noted to be hypothermic. She has left-sided chest pain which appears to be musculoskeletal based on examination. She is a poor  historian overall. She has noted to have acute worsening of her chronic kidney disease.  Plan: #1 Hypothermia: Patient denies any cold exposure recently. TSH is normal. Will check cortisol level. We'll place on thiamine. Uremia is known to cause hypothermia. However, her BUN is not much more elevated than her baseline. However, this remains a possibility. Apply warming blankets. Do not suspect sepsis as an etiology as her white cell count is normal. There is no obvious foci of infection. Lactic acid level is normal. Continue to monitor in step down for now.  #2 acute on chronic kidney disease: Her baseline creatinine is around 3.5 and baseline BUN is between 50-60. She does report poor appetite in the last few weeks, which could've accounted for worsening of her renal  failure. She does have a fistula in her right upper extremity in preparations for eventual dialysis. She continues to make urine. We will gently hydrate her and reevaluate her renal function tomorrow morning. May need to consult nephrology if there is no improvement especially if uremia is responsible for her hypothermia. She was given potassium by the ED physician.  #3 Chest pain: Left side of the chest was tender to palpation. Troponin is normal. EKG shows nonspecific changes which could be due to hypothermia. We will cycle troponins. We will check a VQ scan. Reevaluate in the morning. Etiology for her cough is not entirely clear. Chest x-ray is unremarkable.  #3 history of type 2 diabetes with diabetic nephropathy: Continue with the Levemir at a lower dose. Sliding scale coverage. Check HbA1c in the morning.  #4 history of hypertension: Continue with her home medications.   DVT Prophylaxis: Subcutaneous heparin Code Status: Full code Family Communication: Discussed with the patient  Disposition Plan: To stepdown   Further management decisions will depend on results of further testing and patient's response to treatment.   [facility]  [facility] Pager [phone]  If 7PM-7AM, please contact night-coverage [email or web address] Password [facility]  06/12/2015, 6:43 PM

## 2015-06-12 NOTE — ED Notes (Signed)
Family at bedside. 

## 2015-06-12 NOTE — ED Provider Notes (Signed)
CSN: [id number]     Arrival date & time 06/12/15  1350 History   First MD Initiated Contact with Patient 06/12/15 1352     Chief Complaint  Patient presents with  . Fatigue  . Abnormal Lab   (Consider location/radiation/quality/duration/timing/severity/associated sxs/prior Treatment) HPI  Patient is a 64 year old female with history of CKD, CHF, HTN, HLD, and DM who presents from her PCP office with fatigue, cough, pleuritic chest pain, hypotension, and hypokalemia. Patient reports that she has had chronic, daily nausea, vomiting, and fatigue over the past several months after being diagnosed with kidney disease. She recently have a AVF placed in her right upper extremity in anticipation for dialysis. She is not currently on dialysis. Patient also reports that she has had a chronic, nonproductive cough for the past few months. She developed pleuritic left sided chest pain over the past few days and went to her PCP today for further evaluation. At her PCP's office she was found to be hypotensive to 94/50, hypokalemic (K 2.7), and uremic (BUN 78) with worsening of her renal function Cr (5.0). Patient endorses occasional shortness of breath. The chest pain is located under her left breast and radiates towards her back when she coughs. Pain is made worse with exertion. She has not noticed anything that improves the pain. She denies any fevers or chills. No abdominal pain. No family history of VTE. No recent prolonged immobilization.   Patient took her daily dose of amlodipine, coreg, clonidine, and bidil this morning.   Past Medical History  Diagnosis Date  . Hypertension   . Diverticulitis   . Hyperlipidemia   . Asthma   . Diabetes mellitus     insulin dependent  . Arthritis     knee  . GI bleed 03/31/2013  . CHF (congestive heart failure) ([place])   . Osteoporosis   . Seasonal allergies   . Anxiety   . Abdominal bruit   . Chronic kidney disease     stage 3  . Sleep apnea     can't  afford cpap  . Pneumonia   . GERD (gastroesophageal reflux disease)     from medications  . Complication of anesthesia     " after I got home from my last procedure, I started itching."  . Anemia    Past Surgical History  Procedure Laterality Date  . Abdominal hysterectomy  1993`  . Esophagogastroduodenoscopy N/A 03/31/2013    Procedure: ESOPHAGOGASTRODUODENOSCOPY (EGD);  Surgeon: Gatha Mayer, MD;  Location: [facility] ENDOSCOPY;  Service: Endoscopy;  Laterality: N/A;  . Eye surgery      laser surgery  . Bascilic vein transposition Left 07/10/2014    Procedure: BASCILIC VEIN TRANSPOSITION;  Surgeon: Angelia Mould, MD;  Location: [place];  Service: Vascular;  Laterality: Left;  . Fistulogram Left 10/29/2014    Procedure: FISTULOGRAM;  Surgeon: Angelia Mould, MD;  Location: [facility] CATH LAB;  Service: Cardiovascular;  Laterality: Left;  . Bascilic vein transposition Right 11/08/2014    Procedure: FIRST STAGE BASILIC VEIN TRANSPOSITION;  Surgeon: Angelia Mould, MD;  Location: [place];  Service: Vascular;  Laterality: Right;  . Bascilic vein transposition Right 01/18/2015    Procedure: SECOND STAGE BASILIC VEIN TRANSPOSITION;  Surgeon: Angelia Mould, MD;  Location: [place];  Service: Vascular;  Laterality: Right;  . Patch angioplasty Right 01/18/2015    Procedure: BASILIC VEIN PATCH ANGIOPLASTY USING VASCUGUARD PATCH;  Surgeon: Angelia Mould, MD;  Location: [place];  Service: Vascular;  Laterality: Right;   Family History  Problem Relation Age of Onset  . Colon cancer Neg Hx   . Other Mother     not sure of cause of death  . Diabetes Father   . Pancreatic cancer Maternal Grandmother    Social History  Substance Use Topics  . Smoking status: Former Smoker -- 0.35 packs/day for 40 years    Types: Cigarettes    Quit date: 05/10/2012  . Smokeless tobacco: Never Used  . Alcohol Use: No   OB History    No data available     Review of Systems  Constitutional:  Positive for fatigue.  HENT: Negative.   Eyes: Negative.   Respiratory: Positive for cough.   Cardiovascular: Positive for chest pain. Negative for leg swelling.  Gastrointestinal: Positive for nausea and vomiting. Negative for abdominal pain.  Endocrine: Negative.   Genitourinary: Negative.   Musculoskeletal: Negative.   Skin: Negative.   Allergic/Immunologic: Negative.   Neurological: Negative.   Hematological: Negative.   Psychiatric/Behavioral: Negative.    Allergies  Lisinopril and Prednisone  Home Medications   Prior to Admission medications   Medication Sig Start Date End Date Taking? Authorizing Provider  albuterol (PROVENTIL HFA;VENTOLIN HFA) 108 (90 BASE) MCG/ACT inhaler Inhale 1-2 puffs into the lungs every 6 (six) hours as needed for wheezing or shortness of breath.   Yes Historical Provider, MD  amLODipine (NORVASC) 10 MG tablet Take 1 tablet (10 mg total) by mouth daily. 07/23/13  Yes Velvet Bathe, MD  aspirin 81 MG chewable tablet Chew 81 mg by mouth daily.   Yes Historical Provider, MD  atorvastatin (LIPITOR) 20 MG tablet Take 20 mg by mouth daily at 6 PM.   Yes Historical Provider, MD  budesonide-formoterol (SYMBICORT) 160-4.5 MCG/ACT inhaler Inhale 1 puff into the lungs every evening.   Yes Historical Provider, MD  calcitRIOL (ROCALTROL) 0.25 MCG capsule Take 0.25 mcg by mouth daily.   Yes Historical Provider, MD  carvedilol (COREG) 25 MG tablet Take 1 tablet (25 mg total) by mouth 2 (two) times daily with a meal. 06/01/13  Yes Costin Karlyne Greenspan, MD  cloNIDine (CATAPRES) 0.1 MG tablet Take 1 tablet (0.1 mg total) by mouth 3 (three) times daily. 09/05/14  Yes Velna Hatchet, MD  clotrimazole (LOTRIMIN) 1 % cream Apply 1 application topically 2 (two) times daily. 06/11/15  Yes Max T Hyatt, DPM  ferrous sulfate 325 (65 FE) MG tablet Take 325 mg by mouth daily with breakfast.   Yes Historical Provider, MD  insulin aspart (NOVOLOG) 100 UNIT/ML injection Inject 0-8 Units into  the skin 3 (three) times daily before meals. Sliding scale   Yes Historical Provider, MD  isosorbide-hydrALAZINE (BIDIL) 20-37.5 MG per tablet Take 2 tablets by mouth 3 (three) times daily. 01/12/14  Yes Barton Dubois, MD  LEVEMIR FLEXTOUCH 100 UNIT/ML Pen Inject 10 Units into the muscle at bedtime.  01/16/15  Yes Historical Provider, MD  loratadine (CLARITIN) 10 MG tablet Take 10 mg by mouth daily as needed for allergies.   Yes Historical Provider, MD  multivitamin (RENA-VIT) TABS tablet Take 1 tablet by mouth daily. 01/24/15  Yes Historical Provider, MD  omeprazole (PRILOSEC) 20 MG capsule Take 20 mg by mouth daily.   Yes Historical Provider, MD  potassium chloride SA (K-DUR,KLOR-CON) 20 MEQ tablet Take 1 tablet (20 mEq total) by mouth 2 (two) times daily. 12/10/14  Yes Linton Flemings, MD  torsemide (DEMADEX) 100 MG tablet Take 1 tablet (100 mg total) by mouth daily.  09/05/14  Yes Velna Hatchet, MD  Vitamin D, Ergocalciferol, (DRISDOL) 50000 UNITS CAPS capsule Take 50,000 Units by mouth every 7 (seven) days. Mondays   Yes Historical Provider, MD  metolazone (ZAROXOLYN) 2.5 MG tablet Take 1 tablet (2.5 mg total) by mouth every 3 (three) days. 03/17/15   Thurnell Lose, MD   BP 117/61 mmHg  Pulse 64  Temp(Src) 93.9 F (34.4 C) (Rectal)  Resp 16  SpO2 100% Physical Exam  Constitutional: She is oriented to person, place, and time. She appears well-developed. She appears ill.  HENT:  Head: Normocephalic and atraumatic.  Eyes: EOM are normal. Pupils are equal, round, and reactive to light.  Neck: Normal range of motion. Neck supple.  Cardiovascular: Normal rate, regular rhythm and normal heart sounds.   Pulmonary/Chest: Effort normal and breath sounds normal. No respiratory distress. She has no wheezes. She has no rales.  Abdominal: Soft. Bowel sounds are normal. She exhibits no distension. There is no tenderness. There is no rebound.  Musculoskeletal: Normal range of motion. She exhibits no edema.   Neurological: She is alert and oriented to person, place, and time. No cranial nerve deficit.  Skin: Skin is warm and dry. No erythema.  Psychiatric: She has a normal mood and affect. Her behavior is normal.  Nursing note and vitals reviewed.   ED Course  Procedures (including critical care time) Labs Review Labs Reviewed  CBC WITH DIFFERENTIAL/PLATELET  URINALYSIS, ROUTINE W REFLEX MICROSCOPIC (NOT AT [facility])  TSH  COMPREHENSIVE METABOLIC PANEL  I-STAT CG4 LACTIC ACID, ED  Randolm Idol, ED    Imaging Review Dg Chest Portable 1 View  06/12/2015  CLINICAL DATA:  Chest pain on the left for 2 days, cough EXAM: PORTABLE CHEST - 1 VIEW COMPARISON:  03/15/2015 FINDINGS: Cardiac shadow is within normal limits. The lungs are well aerated bilaterally. No focal infiltrate or sizable effusion is seen. Aortic calcifications are again noted. IMPRESSION: No acute abnormality noted. Electronically Signed   By: Inez Catalina M.D.   On: 06/12/2015 14:59   I have personally reviewed and evaluated these images and lab results as part of my medical decision-making.   EKG Interpretation   Date/Time:  Wednesday June 12 2015 14:03:16 EST Ventricular Rate:  63 PR Interval:  192 QRS Duration: 105 QT Interval:  573 QTC Calculation: 587 R Axis:   20 Text Interpretation:  Sinus rhythm Probable left atrial enlargement LVH  with secondary repolarization abnormality Anterior Q waves, possibly due  to LVH Prolonged QT interval st t  wave changes since last tracing  Confirmed by KNAPP  MD-J, JON [id number]) on 06/12/2015 2:31:18 PM      MDM   Final diagnoses:  None   2:33 PM Patient is a 64 year old female with history of CKD, CHF, HTN, HLD, and DM presenting with fatigue, pleuritic chest pain, nausea and vomiting. Patient hypothermic to 93.70F, otherwise vitals within normal limits. Patient appears ill, otherwise physical exam benign. Patient placed under bair-hugger. EKG with new T-wave inversions  in inferior and lateral leads. Will check CBC, CMP, UA, lactate, troponin, TSH, and CXR.   4:20 PM CBC, lactate, troponin, and CXR negative. TSH, UA, and CMP pending. Will likely need admission for chest pain rule out and hypothermia pending results of CMP. Signed out to oncoming team.   Vivi Barrack, MD 06/12/15 [place], MD 06/12/15 [phone]

## 2015-06-12 NOTE — ED Notes (Signed)
Notified Dr Jerline Pain rectal temp 93.9 and placed pt on blanket warmer.

## 2015-06-12 NOTE — ED Provider Notes (Signed)
She complains of chest pain rating to her back worse with cough for the past 2 days, and by nausea. No other associated symptoms. No vomiting. Cough is improved with time. She has had chronic cough for the past 2 months. Exam alert and nontoxic lungs clear auscultation heart regular rate and rhythm abdomen nondistended nontender irritation noted be hypothermic. Doubt sepsis, no leukocytosis, and no lactic acidosis. Chest x-ray reviewed by me. Spoke with Dr. [name] land 23 hour observation stepdown unit rewarming. Results for orders placed or performed during the hospital encounter of 06/12/15  CBC with Differential  Result Value Ref Range   WBC 7.2 4.0 - 10.5 K/uL   RBC 4.30 3.87 - 5.11 MIL/uL   Hemoglobin 12.6 12.0 - 15.0 g/dL   HCT 38.2 36.0 - 46.0 %   MCV 88.8 78.0 - 100.0 fL   MCH 29.3 26.0 - 34.0 pg   MCHC 33.0 30.0 - 36.0 g/dL   RDW 13.3 11.5 - 15.5 %   Platelets 297 150 - 400 K/uL   Neutrophils Relative % 65 %   Neutro Abs 4.7 1.7 - 7.7 K/uL   Lymphocytes Relative 27 %   Lymphs Abs 1.9 0.7 - 4.0 K/uL   Monocytes Relative 6 %   Monocytes Absolute 0.4 0.1 - 1.0 K/uL   Eosinophils Relative 2 %   Eosinophils Absolute 0.1 0.0 - 0.7 K/uL   Basophils Relative 0 %   Basophils Absolute 0.0 0.0 - 0.1 K/uL  Urinalysis, Routine w reflex microscopic  Result Value Ref Range   Color, Urine YELLOW YELLOW   APPearance CLEAR CLEAR   Specific Gravity, Urine 1.016 1.005 - 1.030   pH 6.0 5.0 - 8.0   Glucose, UA 250 (A) NEGATIVE mg/dL   Hgb urine dipstick NEGATIVE NEGATIVE   Bilirubin Urine NEGATIVE NEGATIVE   Ketones, ur NEGATIVE NEGATIVE mg/dL   Protein, ur >300 (A) NEGATIVE mg/dL   Nitrite NEGATIVE NEGATIVE   Leukocytes, UA NEGATIVE NEGATIVE  TSH  Result Value Ref Range   TSH 0.818 0.350 - 4.500 uIU/mL  Comprehensive metabolic panel  Result Value Ref Range   Sodium 134 (L) 135 - 145 mmol/L   Potassium 2.9 (L) 3.5 - 5.1 mmol/L   Chloride 88 (L) 101 - 111 mmol/L   CO2 31 22 - 32  mmol/L   Glucose, Bld 129 (H) 65 - 99 mg/dL   BUN 78 (H) 6 - 20 mg/dL   Creatinine, Ser 5.12 (H) 0.44 - 1.00 mg/dL   Calcium 8.8 (L) 8.9 - 10.3 mg/dL   Total Protein 7.1 6.5 - 8.1 g/dL   Albumin 3.0 (L) 3.5 - 5.0 g/dL   AST 23 15 - 41 U/L   ALT 19 14 - 54 U/L   Alkaline Phosphatase 117 38 - 126 U/L   Total Bilirubin 0.7 0.3 - 1.2 mg/dL   GFR calc non Af Amer 8 (L) >60 mL/min   GFR calc Af Amer 9 (L) >60 mL/min   Anion gap 15 5 - 15  Urine microscopic-add on  Result Value Ref Range   Squamous Epithelial / LPF 6-30 (A) NONE SEEN   WBC, UA 0-5 0 - 5 WBC/hpf   RBC / HPF 0-5 0 - 5 RBC/hpf   Bacteria, UA NONE SEEN NONE SEEN   Casts HYALINE CASTS (A) NEGATIVE  I-Stat CG4 Lactic Acid, ED  (not at [facility])  Result Value Ref Range   Lactic Acid, Venous 1.47 0.5 - 2.0 mmol/L  I-stat troponin, ED  Result Value Ref Range   Troponin i, poc 0.04 0.00 - 0.08 ng/mL   Comment 3           Dg Chest Portable 1 View  06/12/2015  CLINICAL DATA:  Chest pain on the left for 2 days, cough EXAM: PORTABLE CHEST - 1 VIEW COMPARISON:  03/15/2015 FINDINGS: Cardiac shadow is within normal limits. The lungs are well aerated bilaterally. No focal infiltrate or sizable effusion is seen. Aortic calcifications are again noted. IMPRESSION: No acute abnormality noted. Electronically Signed   By: Inez Catalina M.D.   On: 06/12/2015 14:59    Dx #1 hypothermia  #2 acute on chronic renal failure  Orlie Dakin, MD 06/12/15 [id number]

## 2015-06-13 ENCOUNTER — Observation Stay (HOSPITAL_COMMUNITY): Payer: BC Managed Care – PPO

## 2015-06-13 DIAGNOSIS — I5032 Chronic diastolic (congestive) heart failure: Secondary | ICD-10-CM | POA: Diagnosis present

## 2015-06-13 DIAGNOSIS — Z87891 Personal history of nicotine dependence: Secondary | ICD-10-CM | POA: Diagnosis not present

## 2015-06-13 DIAGNOSIS — E871 Hypo-osmolality and hyponatremia: Secondary | ICD-10-CM | POA: Diagnosis present

## 2015-06-13 DIAGNOSIS — I959 Hypotension, unspecified: Secondary | ICD-10-CM | POA: Diagnosis present

## 2015-06-13 DIAGNOSIS — Z7982 Long term (current) use of aspirin: Secondary | ICD-10-CM | POA: Diagnosis not present

## 2015-06-13 DIAGNOSIS — Z79899 Other long term (current) drug therapy: Secondary | ICD-10-CM | POA: Diagnosis not present

## 2015-06-13 DIAGNOSIS — M13869 Other specified arthritis, unspecified knee: Secondary | ICD-10-CM | POA: Diagnosis present

## 2015-06-13 DIAGNOSIS — R05 Cough: Secondary | ICD-10-CM | POA: Diagnosis present

## 2015-06-13 DIAGNOSIS — E1122 Type 2 diabetes mellitus with diabetic chronic kidney disease: Secondary | ICD-10-CM | POA: Diagnosis present

## 2015-06-13 DIAGNOSIS — F419 Anxiety disorder, unspecified: Secondary | ICD-10-CM | POA: Diagnosis present

## 2015-06-13 DIAGNOSIS — N183 Chronic kidney disease, stage 3 (moderate): Secondary | ICD-10-CM | POA: Diagnosis not present

## 2015-06-13 DIAGNOSIS — I132 Hypertensive heart and chronic kidney disease with heart failure and with stage 5 chronic kidney disease, or end stage renal disease: Secondary | ICD-10-CM | POA: Diagnosis present

## 2015-06-13 DIAGNOSIS — E1142 Type 2 diabetes mellitus with diabetic polyneuropathy: Secondary | ICD-10-CM | POA: Diagnosis present

## 2015-06-13 DIAGNOSIS — N185 Chronic kidney disease, stage 5: Secondary | ICD-10-CM | POA: Diagnosis present

## 2015-06-13 DIAGNOSIS — N179 Acute kidney failure, unspecified: Secondary | ICD-10-CM | POA: Diagnosis present

## 2015-06-13 DIAGNOSIS — G4730 Sleep apnea, unspecified: Secondary | ICD-10-CM | POA: Diagnosis present

## 2015-06-13 DIAGNOSIS — I10 Essential (primary) hypertension: Secondary | ICD-10-CM | POA: Diagnosis not present

## 2015-06-13 DIAGNOSIS — T68XXXA Hypothermia, initial encounter: Secondary | ICD-10-CM | POA: Diagnosis not present

## 2015-06-13 DIAGNOSIS — J45909 Unspecified asthma, uncomplicated: Secondary | ICD-10-CM | POA: Diagnosis present

## 2015-06-13 DIAGNOSIS — E785 Hyperlipidemia, unspecified: Secondary | ICD-10-CM | POA: Diagnosis present

## 2015-06-13 DIAGNOSIS — N2581 Secondary hyperparathyroidism of renal origin: Secondary | ICD-10-CM | POA: Diagnosis present

## 2015-06-13 DIAGNOSIS — K219 Gastro-esophageal reflux disease without esophagitis: Secondary | ICD-10-CM | POA: Diagnosis present

## 2015-06-13 DIAGNOSIS — Z794 Long term (current) use of insulin: Secondary | ICD-10-CM | POA: Diagnosis not present

## 2015-06-13 DIAGNOSIS — R680 Hypothermia, not associated with low environmental temperature: Secondary | ICD-10-CM | POA: Diagnosis present

## 2015-06-13 DIAGNOSIS — D649 Anemia, unspecified: Secondary | ICD-10-CM | POA: Diagnosis present

## 2015-06-13 DIAGNOSIS — E1121 Type 2 diabetes mellitus with diabetic nephropathy: Secondary | ICD-10-CM | POA: Diagnosis present

## 2015-06-13 DIAGNOSIS — N390 Urinary tract infection, site not specified: Secondary | ICD-10-CM | POA: Diagnosis present

## 2015-06-13 DIAGNOSIS — M810 Age-related osteoporosis without current pathological fracture: Secondary | ICD-10-CM | POA: Diagnosis present

## 2015-06-13 DIAGNOSIS — E860 Dehydration: Secondary | ICD-10-CM | POA: Diagnosis present

## 2015-06-13 LAB — COMPREHENSIVE METABOLIC PANEL
ALT: 17 U/L (ref 14–54)
AST: 21 U/L (ref 15–41)
Albumin: 2.8 g/dL — ABNORMAL LOW (ref 3.5–5.0)
Alkaline Phosphatase: 103 U/L (ref 38–126)
Anion gap: 15 (ref 5–15)
BUN: 85 mg/dL — ABNORMAL HIGH (ref 6–20)
CO2: 27 mmol/L (ref 22–32)
Calcium: 8.2 mg/dL — ABNORMAL LOW (ref 8.9–10.3)
Chloride: 88 mmol/L — ABNORMAL LOW (ref 101–111)
Creatinine, Ser: 5.15 mg/dL — ABNORMAL HIGH (ref 0.44–1.00)
GFR calc Af Amer: 9 mL/min — ABNORMAL LOW (ref >60)
GFR calc non Af Amer: 8 mL/min — ABNORMAL LOW (ref >60)
Glucose, Bld: 251 mg/dL — ABNORMAL HIGH (ref 65–99)
Potassium: 3 mmol/L — ABNORMAL LOW (ref 3.5–5.1)
Sodium: 130 mmol/L — ABNORMAL LOW (ref 135–145)
Total Bilirubin: 0.7 mg/dL (ref 0.3–1.2)
Total Protein: 6.8 g/dL (ref 6.5–8.1)

## 2015-06-13 LAB — TROPONIN I
Troponin I: 0.05 ng/mL — ABNORMAL HIGH (ref <0.031)
Troponin I: 0.04 ng/mL — ABNORMAL HIGH (ref <0.031)

## 2015-06-13 LAB — GLUCOSE, CAPILLARY
Glucose-Capillary: 228 mg/dL — ABNORMAL HIGH (ref 65–99)
Glucose-Capillary: 239 mg/dL — ABNORMAL HIGH (ref 65–99)
Glucose-Capillary: 290 mg/dL — ABNORMAL HIGH (ref 65–99)
Glucose-Capillary: 278 mg/dL — ABNORMAL HIGH (ref 65–99)

## 2015-06-13 LAB — MRSA PCR SCREENING: MRSA by PCR: POSITIVE — AB

## 2015-06-13 LAB — CBC
HCT: 31.3 % — ABNORMAL LOW (ref 36.0–46.0)
Hemoglobin: 10.4 g/dL — ABNORMAL LOW (ref 12.0–15.0)
MCH: 29.5 pg (ref 26.0–34.0)
MCHC: 33.2 g/dL (ref 30.0–36.0)
MCV: 88.9 fL (ref 78.0–100.0)
Platelets: 257 10*3/uL (ref 150–400)
RBC: 3.52 MIL/uL — ABNORMAL LOW (ref 3.87–5.11)
RDW: 12.9 % (ref 11.5–15.5)
WBC: 7.1 10*3/uL (ref 4.0–10.5)

## 2015-06-13 LAB — PROTIME-INR
INR: 1.05 (ref 0.00–1.49)
Prothrombin Time: 13.9 seconds (ref 11.6–15.2)

## 2015-06-13 LAB — CORTISOL-AM, BLOOD: Cortisol - AM: 11.3 ug/dL (ref 6.7–22.6)

## 2015-06-13 LAB — MAGNESIUM: Magnesium: 1.8 mg/dL (ref 1.7–2.4)

## 2015-06-13 MED ORDER — HYDROCODONE-ACETAMINOPHEN 5-325 MG PO TABS
1.0000 | ORAL_TABLET | ORAL | Status: AC | PRN
  Administered 2015-06-13 – 2015-06-14 (×2): 1 via ORAL
  Filled 2015-06-13 (×2): qty 1

## 2015-06-13 MED ORDER — CHLORHEXIDINE GLUCONATE CLOTH 2 % EX PADS
6.0000 | MEDICATED_PAD | Freq: Every day | CUTANEOUS | Status: DC
  Administered 2015-06-13 – 2015-06-14 (×2): 6 via TOPICAL

## 2015-06-13 MED ORDER — POTASSIUM CHLORIDE CRYS ER 20 MEQ PO TBCR
40.0000 meq | EXTENDED_RELEASE_TABLET | ORAL | Status: AC
  Administered 2015-06-13 (×2): 40 meq via ORAL
  Filled 2015-06-13 (×2): qty 2

## 2015-06-13 MED ORDER — SODIUM CHLORIDE 0.9 % IV SOLN
INTRAVENOUS | Status: DC
  Administered 2015-06-13 – 2015-06-14 (×2): via INTRAVENOUS

## 2015-06-13 MED ORDER — DEXTROSE 5 % IV SOLN
1.0000 g | INTRAVENOUS | Status: DC
  Administered 2015-06-13: 1 g via INTRAVENOUS
  Filled 2015-06-13 (×2): qty 10

## 2015-06-13 MED ORDER — CARVEDILOL 6.25 MG PO TABS
6.2500 mg | ORAL_TABLET | Freq: Two times a day (BID) | ORAL | Status: DC
  Administered 2015-06-13 – 2015-06-14 (×2): 6.25 mg via ORAL
  Filled 2015-06-13 (×2): qty 1

## 2015-06-13 MED ORDER — MUPIROCIN 2 % EX OINT
1.0000 "application " | TOPICAL_OINTMENT | Freq: Two times a day (BID) | CUTANEOUS | Status: DC
  Administered 2015-06-13 – 2015-06-14 (×3): 1 via NASAL
  Filled 2015-06-13: qty 22

## 2015-06-13 MED ORDER — MUPIROCIN 2 % EX OINT
TOPICAL_OINTMENT | CUTANEOUS | Status: AC
  Administered 2015-06-13: 1 via NASAL
  Filled 2015-06-13: qty 22

## 2015-06-13 MED ORDER — ISOSORB DINITRATE-HYDRALAZINE 20-37.5 MG PO TABS
2.0000 | ORAL_TABLET | Freq: Three times a day (TID) | ORAL | Status: DC
  Administered 2015-06-14: 2 via ORAL
  Filled 2015-06-13: qty 2

## 2015-06-13 MED ORDER — SODIUM CHLORIDE 0.45 % IV SOLN
INTRAVENOUS | Status: DC
  Administered 2015-06-13: 10:00:00 via INTRAVENOUS

## 2015-06-13 NOTE — Consult Note (Signed)
Referring Provider: No ref. provider found Primary Care Physician:  Velna Hatchet, MD Primary Nephrologist:  Dr Lorrene Reid.   Reason for Consultation:  Chronic renal disease stage 5  Hypertension diabetes with diabetic nephropathy   HPI: 64 y.o. female with a past medical history of chronic kidney disease, hypertension, diabetes, who was in her usual state of health till a few weeks ago when she started coughing. The symptoms were on and off. She denies any yellowish expectoration. No blood in the sputum. No fever. No nausea and no vomiting today. Baseline creatinine of 3.5 with GFR of about 20cc/min secondary to diabetic nephropathy. She has had frequent visits to the ER with HTN and shortness of breath. She also has acute on chronic renal  Failure at these visits. She has an AVF which was a 2 stage procedure done 4/16 and 6/16 in the right arm.  She is managed by Dr Lorrene Reid for BP and volume control and secondary hyperparathyroidism and anemia.  Past Medical History  Diagnosis Date  . Hypertension   . Diverticulitis   . Hyperlipidemia   . Asthma   . Diabetes mellitus     insulin dependent  . Arthritis     knee  . GI bleed 03/31/2013  . CHF (congestive heart failure) ([place])   . Osteoporosis   . Seasonal allergies   . Anxiety   . Abdominal bruit   . Chronic kidney disease     stage 3  . Sleep apnea     can't afford cpap  . Pneumonia   . GERD (gastroesophageal reflux disease)     from medications  . Complication of anesthesia     " after I got home from my last procedure, I started itching."  . Anemia     Past Surgical History  Procedure Laterality Date  . Abdominal hysterectomy  1993`  . Esophagogastroduodenoscopy N/A 03/31/2013    Procedure: ESOPHAGOGASTRODUODENOSCOPY (EGD);  Surgeon: Gatha Mayer, MD;  Location: [facility] ENDOSCOPY;  Service: Endoscopy;  Laterality: N/A;  . Eye surgery      laser surgery  . Bascilic vein transposition Left 07/10/2014    Procedure: BASCILIC VEIN  TRANSPOSITION;  Surgeon: Angelia Mould, MD;  Location: [place];  Service: Vascular;  Laterality: Left;  . Fistulogram Left 10/29/2014    Procedure: FISTULOGRAM;  Surgeon: Angelia Mould, MD;  Location: [facility] CATH LAB;  Service: Cardiovascular;  Laterality: Left;  . Bascilic vein transposition Right 11/08/2014    Procedure: FIRST STAGE BASILIC VEIN TRANSPOSITION;  Surgeon: Angelia Mould, MD;  Location: [place];  Service: Vascular;  Laterality: Right;  . Bascilic vein transposition Right 01/18/2015    Procedure: SECOND STAGE BASILIC VEIN TRANSPOSITION;  Surgeon: Angelia Mould, MD;  Location: [place];  Service: Vascular;  Laterality: Right;  . Patch angioplasty Right 01/18/2015    Procedure: BASILIC VEIN PATCH ANGIOPLASTY USING VASCUGUARD PATCH;  Surgeon: Angelia Mould, MD;  Location: [place];  Service: Vascular;  Laterality: Right;    Prior to Admission medications   Medication Sig Start Date End Date Taking? Authorizing Provider  albuterol (PROVENTIL HFA;VENTOLIN HFA) 108 (90 BASE) MCG/ACT inhaler Inhale 1-2 puffs into the lungs every 6 (six) hours as needed for wheezing or shortness of breath.   Yes Historical Provider, MD  amLODipine (NORVASC) 10 MG tablet Take 1 tablet (10 mg total) by mouth daily. 07/23/13  Yes Velvet Bathe, MD  aspirin 81 MG chewable tablet Chew 81 mg by mouth daily.   Yes Historical  Provider, MD  atorvastatin (LIPITOR) 20 MG tablet Take 20 mg by mouth daily at 6 PM.   Yes Historical Provider, MD  budesonide-formoterol (SYMBICORT) 160-4.5 MCG/ACT inhaler Inhale 1 puff into the lungs every evening.   Yes Historical Provider, MD  calcitRIOL (ROCALTROL) 0.25 MCG capsule Take 0.25 mcg by mouth daily.   Yes Historical Provider, MD  carvedilol (COREG) 25 MG tablet Take 1 tablet (25 mg total) by mouth 2 (two) times daily with a meal. 06/01/13  Yes Costin Karlyne Greenspan, MD  cloNIDine (CATAPRES) 0.1 MG tablet Take 1 tablet (0.1 mg total) by mouth 3 (three) times  daily. 09/05/14  Yes Velna Hatchet, MD  clotrimazole (LOTRIMIN) 1 % cream Apply 1 application topically 2 (two) times daily. 06/11/15  Yes Max T Hyatt, DPM  ferrous sulfate 325 (65 FE) MG tablet Take 325 mg by mouth daily with breakfast.   Yes Historical Provider, MD  insulin aspart (NOVOLOG) 100 UNIT/ML injection Inject 0-8 Units into the skin 3 (three) times daily before meals. Sliding scale   Yes Historical Provider, MD  isosorbide-hydrALAZINE (BIDIL) 20-37.5 MG per tablet Take 2 tablets by mouth 3 (three) times daily. 01/12/14  Yes Barton Dubois, MD  LEVEMIR FLEXTOUCH 100 UNIT/ML Pen Inject 10 Units into the muscle at bedtime.  01/16/15  Yes Historical Provider, MD  loratadine (CLARITIN) 10 MG tablet Take 10 mg by mouth daily as needed for allergies.   Yes Historical Provider, MD  multivitamin (RENA-VIT) TABS tablet Take 1 tablet by mouth daily. 01/24/15  Yes Historical Provider, MD  omeprazole (PRILOSEC) 20 MG capsule Take 20 mg by mouth daily.   Yes Historical Provider, MD  potassium chloride SA (K-DUR,KLOR-CON) 20 MEQ tablet Take 1 tablet (20 mEq total) by mouth 2 (two) times daily. 12/10/14  Yes Linton Flemings, MD  torsemide (DEMADEX) 100 MG tablet Take 1 tablet (100 mg total) by mouth daily. 09/05/14  Yes Velna Hatchet, MD  Vitamin D, Ergocalciferol, (DRISDOL) 50000 UNITS CAPS capsule Take 50,000 Units by mouth every 7 (seven) days. Mondays   Yes Historical Provider, MD  metolazone (ZAROXOLYN) 2.5 MG tablet Take 1 tablet (2.5 mg total) by mouth every 3 (three) days. 03/17/15   Thurnell Lose, MD    Current Facility-Administered Medications  Medication Dose Route Frequency Provider Last Rate Last Dose  . 0.9 %  sodium chloride infusion   Intravenous Continuous Thurnell Lose, MD      . acetaminophen (TYLENOL) tablet 650 mg  650 mg Oral Q6H PRN Bonnielee Haff, MD      . aspirin chewable tablet 81 mg  81 mg Oral Daily Bonnielee Haff, MD   81 mg at 06/13/15 0916  . atorvastatin (LIPITOR) tablet  20 mg  20 mg Oral q1800 Bonnielee Haff, MD      . budesonide-formoterol (SYMBICORT) 160-4.5 MCG/ACT inhaler 1 puff  1 puff Inhalation QPM Bonnielee Haff, MD   1 puff at 06/12/15 2000  . calcitRIOL (ROCALTROL) capsule 0.25 mcg  0.25 mcg Oral Daily Bonnielee Haff, MD   0.25 mcg at 06/13/15 0915  . carvedilol (COREG) tablet 6.25 mg  6.25 mg Oral BID WC Thurnell Lose, MD      . cefTRIAXone (ROCEPHIN) 1 g in dextrose 5 % 50 mL IVPB  1 g Intravenous Q24H Thurnell Lose, MD      . Chlorhexidine Gluconate Cloth 2 % PADS 6 each  6 each Topical Q0600 Thurnell Lose, MD   6 each at 06/13/15 0719  .  chlorpheniramine-HYDROcodone (TUSSIONEX) 10-8 MG/5ML suspension 5 mL  5 mL Oral Q12H PRN Gardiner Barefoot, NP   5 mL at 06/12/15 2139  . dextromethorphan (DELSYM) 30 MG/5ML liquid 30 mg  30 mg Oral Q12H PRN Gardiner Barefoot, NP      . ferrous sulfate tablet 325 mg  325 mg Oral Q breakfast Bonnielee Haff, MD   325 mg at 06/13/15 0751  . heparin injection 5,000 Units  5,000 Units Subcutaneous 3 times per day Bonnielee Haff, MD   5,000 Units at 06/13/15 0500  . insulin aspart (novoLOG) injection 0-9 Units  0-9 Units Subcutaneous TID WC Bonnielee Haff, MD   3 Units at 06/13/15 0908  . insulin detemir (LEVEMIR) injection 5 Units  5 Units Subcutaneous QHS Bonnielee Haff, MD   5 Units at 06/12/15 2139  . [START ON 06/14/2015] isosorbide-hydrALAZINE (BIDIL) 20-37.5 MG per tablet 2 tablet  2 tablet Oral TID Thurnell Lose, MD      . mupirocin ointment (BACTROBAN) 2 % 1 application  1 application Nasal BID Thurnell Lose, MD   1 application at [id number] [phone]  . ondansetron (ZOFRAN) injection 4 mg  4 mg Intravenous Q6H PRN Bonnielee Haff, MD      . pantoprazole (PROTONIX) EC tablet 40 mg  40 mg Oral Daily Bonnielee Haff, MD   40 mg at 06/13/15 0915  . potassium chloride SA (K-DUR,KLOR-CON) CR tablet 40 mEq  40 mEq Oral Q4H Thurnell Lose, MD      . sodium chloride 0.9 % injection 3 mL  3 mL Intravenous Q12H  Bonnielee Haff, MD   3 mL at 06/12/15 2140  . technetium TC 26M diethylenetriame-pentaacetic acid (DTPA) injection 31.7 milli Curie  31.7 milli Curie Inhalation Once PRN Bonnielee Haff, MD      . thiamine (VITAMIN B-1) tablet 100 mg  100 mg Oral Daily Bonnielee Haff, MD   100 mg at 06/13/15 0915    Allergies as of 06/12/2015 - Review Complete 06/12/2015  Allergen Reaction Noted  . Lisinopril Cough 08/22/2011  . Prednisone Other (See Comments) 09/08/2013    Family History  Problem Relation Age of Onset  . Colon cancer Neg Hx   . Other Mother     not sure of cause of death  . Diabetes Father   . Pancreatic cancer Maternal Grandmother     Social History   Social History  . Marital Status: Married    Spouse Name: [name]  . Number of Children: 4  . Years of Education: some collg   Occupational History  . Retired    Social History Main Topics  . Smoking status: Former Smoker -- 0.35 packs/day for 40 years    Types: Cigarettes    Quit date: 05/10/2012  . Smokeless tobacco: Never Used  . Alcohol Use: No  . Drug Use: Yes     Comment: marijuana; quit in early 1980's  . Sexual Activity: Yes   Other Topics Concern  . Not on file   Social History Narrative   Patient lives at home with spouse.   Caffeine Use: 1 cup daily    Review of Systems: Gen: + anorexia, fatigue, weakness, malaise, weight loss, and sleep disorder HEENT: No visual complaints, +history of Retinopathy. Normal external appearance No Epistaxis or Sore throat. No sinusitis.   CV: Denies chest pain, angina, palpitations, syncope, orthopnea, PND, peripheral edema, and claudication. Resp: + SOB with exercise, +cough, sputum dry  GI: Denies vomiting blood, jaundice, and fecal incontinence.  Denies dysphagia or odynophagia. GU : Denies urinary burning, blood in urine, urinary frequency, urinary hesitancy, nocturnal urination, and urinary incontinence.  No renal calculi. MS: Denies joint pain, limitation of  movement, and swelling, stiffness, low back pain, extremity pain. Denies muscle weakness, cramps, atrophy.  No use of non steroidal antiinflammatory drugs. Derm: Denies rash, itching, dry skin, hives, moles, warts, or unhealing ulcers.  Psych: Denies depression, anxiety, memory loss, suicidal ideation, hallucinations, paranoia, and confusion. Heme: Denies bruising, bleeding, and enlarged lymph nodes. Neuro: No headache.  No diplopia. No dysarthria.  No dysphasia.  No history of CVA.  No Seizures. No paresthesias.  No weakness. EndocrineDM.  No Thyroid disease.  No Adrenal disease.  Physical Exam: Vital signs in last 24 hours: Temp:  [93.7 F (34.3 C)-98.6 F (37 C)] 97.8 F (36.6 C) (11/17 1100) Pulse Rate:  [59-80] 66 (11/17 1100) Resp:  [11-21] 14 (11/17 1100) BP: (94-164)/(51-86) 164/77 mmHg (11/17 1100) SpO2:  [94 %-100 %] 97 % (11/17 1100) Weight:  [65 kg (143 lb 4.8 oz)-66 kg (145 lb 8.1 oz)] 65 kg (143 lb 4.8 oz) (11/17 0401) Last BM Date: 06/11/15 General:   Alert,  Well-developed, well-nourished, pleasant and cooperative in NAD Head:  Normocephalic and atraumatic. Eyes:  Sclera clear, no icterus.   Conjunctiva pink. Ears:  Normal auditory acuity. Nose:  No deformity, discharge,  or lesions. Mouth:  No deformity or lesions, dentition normal. Neck:  Supple; no masses or thyromegaly. JVP not elevated Lungs:  Clear throughout to auscultation.   No wheezes, crackles, or rhonchi. No acute distress. Heart:  Regular rate and rhythm; no murmurs, clicks, rubs,  or gallops. Abdomen:  Soft, nontender and nondistended. No masses, hepatosplenomegaly or hernias noted. Normal bowel sounds, without guarding, and without rebound.   Msk:  Symmetrical without gross deformities. Normal posture. Pulses:  No carotid, renal, femoral bruits. DP and PT symmetrical and equal Extremities:  Without clubbing or edema. Right AVF Neurologic:  Alert and  oriented x4;  grossly normal neurologically. Skin:   Intact without significant lesions or rashes. Cervical Nodes:  No significant cervical adenopathy. Psych:  Alert and cooperative. Normal mood and affect.  Intake/Output from previous day: 11/16 0701 - 11/17 0700 In: 794.3 [P.O.:240; I.V.:554.3] Out: 900 [Urine:900] Intake/Output this shift: Total I/O In: 240 [P.O.:240] Out: -   Lab Results:  Recent Labs  06/12/15 1415 06/13/15 0840  WBC 7.2 7.1  HGB 12.6 10.4*  HCT 38.2 31.3*  PLT 297 257   BMET  Recent Labs  06/12/15 1613 06/13/15 0840  NA 134* 130*  K 2.9* 3.0*  CL 88* 88*  CO2 31 27  GLUCOSE 129* 251*  BUN 78* 85*  CREATININE 5.12* 5.15*  CALCIUM 8.8* 8.2*   LFT  Recent Labs  06/13/15 0840  PROT 6.8  ALBUMIN 2.8*  AST 21  ALT 17  ALKPHOS 103  BILITOT 0.7   PT/INR  Recent Labs  06/13/15 0840  LABPROT 13.9  INR 1.05   Hepatitis Panel No results for input(s): HEPBSAG, HCVAB, HEPAIGM, HEPBIGM in the last 72 hours.  Studies/Results: Nm Pulmonary Perf And Vent  06/12/2015  CLINICAL DATA:  Chronic cough, with acute onset of left breast soreness and nausea. Initial encounter. EXAM: NUCLEAR MEDICINE VENTILATION - PERFUSION LUNG SCAN TECHNIQUE: Ventilation images were obtained in multiple projections using inhaled aerosol Tc-3m DTPA. Perfusion images were obtained in multiple projections after intravenous injection of Tc-48m MAA. RADIOPHARMACEUTICALS:  31.7 mCi Technetium-88m DTPA aerosol inhalation and 4.33 mCi Technetium-63m  MAA IV COMPARISON:  Chest radiograph performed earlier today at 2:42 p.m. FINDINGS: Ventilation: No focal ventilation defect. Perfusion: No wedge shaped peripheral perfusion defects to suggest acute pulmonary embolism. IMPRESSION: Normal V/Q scan.  No evidence for pulmonary embolus. Electronically Signed   By: Garald Balding M.D.   On: 06/12/2015 23:56   Dg Chest Portable 1 View  06/12/2015  CLINICAL DATA:  Chest pain on the left for 2 days, cough EXAM: PORTABLE CHEST - 1 VIEW  COMPARISON:  03/15/2015 FINDINGS: Cardiac shadow is within normal limits. The lungs are well aerated bilaterally. No focal infiltrate or sizable effusion is seen. Aortic calcifications are again noted. IMPRESSION: No acute abnormality noted. Electronically Signed   By: Inez Catalina M.D.   On: 06/12/2015 14:59    Assessment/Plan:      Chronic renal disease stage 5 with low GFR   Secondary to diabetes Followed by Dr Lorrene Reid Not uremic and no real urgent indications for dialysis Renally adjust medications Serial chemistries       Hypertension Appears stable secondary to chronic kidney  disease Continue current medications to control-- coreg appears to be working well       Secondary hyperparathyroidism Continue vitamin D analogue Follow calcium and phosporus        Anemia Hb goal 10-12   Was receiving darbepoietin in clinic       Vascular Access  Mature and ready Thrill and Bruit   No indications for dialysis at this point and will be happy to follow      LOS:  Cindee Mclester W @TODAY @12 :57 PM

## 2015-06-13 NOTE — Progress Notes (Addendum)
Patient Demographics:    Destiny Day, is a 64 y.o. female, DOB - [birth year], WN:[id number]  Admit date - 06/12/2015   Admitting Physician Bonnielee Haff, MD  Outpatient Primary MD for the patient is Velna Hatchet, MD  LOS -    Chief Complaint  Patient presents with  . Fatigue  . Abnormal Lab        Subjective:    Destiny Day today has, No headache, No chest pain, No abdominal pain - No Nausea, No new weakness tingling or numbness, mild chronic Cough - no SOB.    Assessment  & Plan :    1. Hyponatremia. Reason unclear but now completely resolved, no clinical signs of sepsis, TSH and cortisol stable. Continue to monitor on room temperature. Currently without warming blankets or any rewarming measures.  2. 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 Baseline creatinine around 2.4. Likely due to combination of hypotension and dehydration, being hydrated, minimize blood pressure medications, obtain renal ultrasound and nephrology consult. Monitor BMP.  3. Hypotension. Likely reflective of dehydration. Afebrile, nontoxic, no leukocytosis, normal lactate, cut down on blood pressure medications, hydrate and monitor. TSH cortisol stable.  4. Musculoskeletal left-sided chest pain due to chronic cough. EKG nonacute, VQ scan normal, will obtain echogram to evaluate wall motion. Mild troponin rise is in non-ACS pattern and likely secondary to renal failure. Currently on aspirin and Coreg along with statin which will be continued.  5. ? UTI - UA borderline, place on Rocephin and follow cultures.    6. 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 Currently on Levemir and sliding scale continued.  Lab Results  Component Value Date   HGBA1C 11.0* 01/09/2015   CBG (last 3)   Recent Labs  06/12/15 2118  06/13/15 0828  GLUCAP 384* 228*      Code Status : Full  Family Communication  : None present  Disposition Plan  : Home 1-2 days  Consults  :  Renal  Procedures  :  VQ scan. Normal  Renal ultrasound ordered  Echogram ordered  DVT Prophylaxis  :    Heparin    Lab Results  Component Value Date   PLT 257 06/13/2015    Inpatient Medications  Scheduled Meds: . aspirin  81 mg Oral Daily  . atorvastatin  20 mg Oral q1800  . budesonide-formoterol  1 puff Inhalation QPM  . calcitRIOL  0.25 mcg Oral Daily  . carvedilol  6.25 mg Oral BID WC  . Chlorhexidine Gluconate Cloth  6 each Topical Q0600  . ferrous sulfate  325 mg Oral Q breakfast  . heparin  5,000 Units Subcutaneous 3 times per day  . insulin aspart  0-9 Units Subcutaneous TID WC  . insulin detemir  5 Units Subcutaneous QHS  . [START ON 06/14/2015] isosorbide-hydrALAZINE  2 tablet Oral TID  . mupirocin ointment  1 application Nasal BID  . pantoprazole  40 mg Oral Daily  . potassium chloride  40 mEq Oral Q4H  . sodium chloride  3 mL Intravenous Q12H  . thiamine  100 mg Oral Daily   Continuous Infusions: . sodium chloride     PRN Meds:.acetaminophen **OR** [DISCONTINUED] acetaminophen, chlorpheniramine-HYDROcodone, dextromethorphan, [DISCONTINUED] ondansetron **OR** ondansetron (ZOFRAN) IV, technetium TC 96M diethylenetriame-pentaacetic acid  Antibiotics  :  Anti-infectives    None        Objective:   Filed Vitals:   06/13/15 0700 06/13/15 0823 06/13/15 0909 06/13/15 0914  BP: 126/60 114/57 116/58 116/58  Pulse: 73 74    Temp:  97.6 F (36.4 C)    TempSrc:  Oral    Resp: 11 21    Height:      Weight:      SpO2: 98% 100%      Wt Readings from Last 3 Encounters:  06/13/15 65 kg (143 lb 4.8 oz)  03/17/15 66.9 kg (147 lb 7.8 oz)  03/01/15 63.05 kg (139 lb)     Intake/Output Summary (Last 24 hours) at 06/13/15 1158 Last data filed at 06/13/15 0900  Gross per 24 hour  Intake 1034.25 ml   Output    900 ml  Net 134.25 ml     Physical Exam  Awake Alert, Oriented X 3, No new F.N deficits, Normal affect Destiny Day.AT,PERRAL Supple Neck,No JVD, No cervical lymphadenopathy appriciated.  Symmetrical Chest wall movement, Good air movement bilaterally, CTAB RRR,No Gallops,Rubs or new Murmurs, No Parasternal Heave +ve B.Sounds, Abd Soft, No tenderness, No organomegaly appriciated, No rebound - guarding or rigidity. No Cyanosis, Clubbing or edema, No new Rash or bruise      Data Review:   Micro Results Recent Results (from the past 240 hour(s))  MRSA PCR Screening     Status: Abnormal   Collection Time: 06/12/15 11:01 PM  Result Value Ref Range Status   MRSA by PCR POSITIVE (A) NEGATIVE Final    Comment:        The GeneXpert MRSA Assay (FDA approved for NASAL specimens only), is one component of a comprehensive MRSA colonization surveillance program. It is not intended to diagnose MRSA infection nor to guide or monitor treatment for MRSA infections. RESULT CALLED TO, READ BACK BY AND VERIFIED WITHDolan Amen RN [id number] 06/13/15 A BROWNING     Radiology Reports Nm Pulmonary Perf And Vent  06/12/2015  CLINICAL DATA:  Chronic cough, with acute onset of left breast soreness and nausea. Initial encounter. EXAM: NUCLEAR MEDICINE VENTILATION - PERFUSION LUNG SCAN TECHNIQUE: Ventilation images were obtained in multiple projections using inhaled aerosol Tc-30m DTPA. Perfusion images were obtained in multiple projections after intravenous injection of Tc-73m MAA. RADIOPHARMACEUTICALS:  31.7 mCi Technetium-44m DTPA aerosol inhalation and 4.33 mCi Technetium-72m MAA IV COMPARISON:  Chest radiograph performed earlier today at 2:42 p.m. FINDINGS: Ventilation: No focal ventilation defect. Perfusion: No wedge shaped peripheral perfusion defects to suggest acute pulmonary embolism. IMPRESSION: Normal V/Q scan.  No evidence for pulmonary embolus. Electronically Signed   By: Garald Balding  M.D.   On: 06/12/2015 23:56   Dg Chest Portable 1 View  06/12/2015  CLINICAL DATA:  Chest pain on the left for 2 days, cough EXAM: PORTABLE CHEST - 1 VIEW COMPARISON:  03/15/2015 FINDINGS: Cardiac shadow is within normal limits. The lungs are well aerated bilaterally. No focal infiltrate or sizable effusion is seen. Aortic calcifications are again noted. IMPRESSION: No acute abnormality noted. Electronically Signed   By: Inez Catalina M.D.   On: 06/12/2015 14:59     CBC  Recent Labs Lab 06/12/15 1415 06/13/15 0840  WBC 7.2 7.1  HGB 12.6 10.4*  HCT 38.2 31.3*  PLT 297 257  MCV 88.8 88.9  MCH 29.3 29.5  MCHC 33.0 33.2  RDW 13.3 12.9  LYMPHSABS 1.9  --   MONOABS 0.4  --   EOSABS 0.1  --  BASOSABS 0.0  --     Chemistries   Recent Labs Lab 06/12/15 1613 06/13/15 0810 06/13/15 0840  NA 134*  --  130*  K 2.9*  --  3.0*  CL 88*  --  88*  CO2 31  --  27  GLUCOSE 129*  --  251*  BUN 78*  --  85*  CREATININE 5.12*  --  5.15*  CALCIUM 8.8*  --  8.2*  MG  --  1.8  --   AST 23  --  21  ALT 19  --  17  ALKPHOS 117  --  103  BILITOT 0.7  --  0.7   ------------------------------------------------------------------------------------------------------------------ estimated creatinine clearance is 9.9 mL/min (by C-G formula based on Cr of 5.15). ------------------------------------------------------------------------------------------------------------------ No results for input(s): HGBA1C in the last 72 hours. ------------------------------------------------------------------------------------------------------------------ No results for input(s): CHOL, HDL, LDLCALC, TRIG, CHOLHDL, LDLDIRECT in the last 72 hours. ------------------------------------------------------------------------------------------------------------------  Recent Labs  06/12/15 1613  TSH 0.818    ------------------------------------------------------------------------------------------------------------------ No results for input(s): VITAMINB12, FOLATE, FERRITIN, TIBC, IRON, RETICCTPCT in the last 72 hours.  Coagulation profile  Recent Labs Lab 06/13/15 0840  INR 1.05    No results for input(s): DDIMER in the last 72 hours.  Cardiac Enzymes  Recent Labs Lab 06/12/15 2226 06/13/15 0221 06/13/15 0840  TROPONINI 0.05* 0.05* 0.04*   ------------------------------------------------------------------------------------------------------------------ Invalid input(s): POCBNP   Time Spent in minutes 35   Haydn Cush K M.D on 06/13/2015 at 11:58 AM  Between 7am to 7pm - Pager - [phone]  After 7pm go to [email or web address] - password [facility]  [facility] -  Office  [phone]

## 2015-06-14 ENCOUNTER — Inpatient Hospital Stay (HOSPITAL_COMMUNITY): Payer: BC Managed Care – PPO

## 2015-06-14 DIAGNOSIS — N179 Acute kidney failure, unspecified: Secondary | ICD-10-CM | POA: Insufficient documentation

## 2015-06-14 DIAGNOSIS — N183 Chronic kidney disease, stage 3 (moderate): Secondary | ICD-10-CM

## 2015-06-14 DIAGNOSIS — I10 Essential (primary) hypertension: Secondary | ICD-10-CM

## 2015-06-14 DIAGNOSIS — E1121 Type 2 diabetes mellitus with diabetic nephropathy: Secondary | ICD-10-CM

## 2015-06-14 LAB — BASIC METABOLIC PANEL
Anion gap: 15 (ref 5–15)
BUN: 81 mg/dL — ABNORMAL HIGH (ref 6–20)
CO2: 26 mmol/L (ref 22–32)
Calcium: 8.6 mg/dL — ABNORMAL LOW (ref 8.9–10.3)
Chloride: 93 mmol/L — ABNORMAL LOW (ref 101–111)
Creatinine, Ser: 4.37 mg/dL — ABNORMAL HIGH (ref 0.44–1.00)
GFR calc Af Amer: 11 mL/min — ABNORMAL LOW (ref >60)
GFR calc non Af Amer: 10 mL/min — ABNORMAL LOW (ref >60)
Glucose, Bld: 183 mg/dL — ABNORMAL HIGH (ref 65–99)
Potassium: 3.5 mmol/L (ref 3.5–5.1)
Sodium: 134 mmol/L — ABNORMAL LOW (ref 135–145)

## 2015-06-14 LAB — HEMOGLOBIN A1C
Hgb A1c MFr Bld: 11.3 % — ABNORMAL HIGH (ref 4.8–5.6)
Mean Plasma Glucose: 278 mg/dL

## 2015-06-14 LAB — GLUCOSE, CAPILLARY: Glucose-Capillary: 124 mg/dL — ABNORMAL HIGH (ref 65–99)

## 2015-06-14 MED ORDER — ONDANSETRON HCL 4 MG PO TABS: 4.0000 mg | ORAL_TABLET | Freq: Three times a day (TID) | ORAL | Status: DC | PRN

## 2015-06-14 MED ORDER — CARVEDILOL 12.5 MG PO TABS
12.5000 mg | ORAL_TABLET | Freq: Two times a day (BID) | ORAL | Status: DC
Start: 2015-06-14 — End: 2015-07-03

## 2015-06-14 MED ORDER — METOLAZONE 2.5 MG PO TABS: 2.5000 mg | ORAL_TABLET | ORAL | Status: DC

## 2015-06-14 MED ORDER — POTASSIUM CHLORIDE CRYS ER 20 MEQ PO TBCR: 20.0000 meq | EXTENDED_RELEASE_TABLET | Freq: Two times a day (BID) | ORAL | Status: DC

## 2015-06-14 MED ORDER — ISOSORB DINITRATE-HYDRALAZINE 20-37.5 MG PO TABS: 2.0000 | ORAL_TABLET | Freq: Two times a day (BID) | ORAL | Status: DC

## 2015-06-14 MED ORDER — PROMETHAZINE HCL 25 MG RE SUPP: 25.0000 mg | Freq: Four times a day (QID) | RECTAL | Status: DC | PRN

## 2015-06-14 MED ORDER — TORSEMIDE 100 MG PO TABS: 100.0000 mg | ORAL_TABLET | Freq: Every day | ORAL | Status: DC

## 2015-06-14 MED ORDER — HYDRALAZINE HCL 20 MG/ML IJ SOLN: 10.0000 mg | INTRAMUSCULAR | Status: DC | PRN

## 2015-06-14 MED ORDER — CEFPODOXIME PROXETIL 200 MG PO TABS: 200.0000 mg | ORAL_TABLET | Freq: Two times a day (BID) | ORAL | Status: DC

## 2015-06-14 NOTE — Progress Notes (Signed)
Patient discharged per orders. Patient's husband at bedside for d/c teaching/instructions. Prescriptions, medications, home care, self care, follow up appointments discussed with time allowed for questions and concerns. Patient and her husband state they have none. Patient awaiting a volunteer for wheelchair transport to the main entrance.  Soyla Dryer, RN

## 2015-06-14 NOTE — Discharge Instructions (Signed)
Follow with Primary MD Velna Hatchet, MD in 7 days   Get CBC, CMP, 2 view Chest X ray checked  by Primary MD next visit.    Activity: As tolerated with Full fall precautions use walker/cane & assistance as needed   Disposition Home     Diet: Renal low Carb. Check your Weight same time everyday, if you gain over 2 pounds, or you develop in leg swelling, experience more shortness of breath or chest pain, call your Primary MD immediately. Follow Cardiac Low Salt Diet and 1.5 lit/day fluid restriction.   On your next visit with your primary care physician please Get Medicines reviewed and adjusted.   Please request your Prim.MD to go over all Hospital Tests and Procedure/Radiological results at the follow up, please get all Hospital records sent to your Prim MD by signing hospital release before you go home.   If you experience worsening of your admission symptoms, develop shortness of breath, life threatening emergency, suicidal or homicidal thoughts you must seek medical attention immediately by calling 911 or calling your MD immediately  if symptoms less severe.  You Must read complete instructions/literature along with all the possible adverse reactions/side effects for all the Medicines you take and that have been prescribed to you. Take any new Medicines after you have completely understood and accpet all the possible adverse reactions/side effects.   Do not drive, operating heavy machinery, perform activities at heights, swimming or participation in water activities or provide baby sitting services if your were admitted for syncope or siezures until you have seen by Primary MD or a Neurologist and advised to do so again.  Do not drive when taking Pain medications.    Do not take more than prescribed Pain, Sleep and Anxiety Medications  Special Instructions: If you have smoked or chewed Tobacco  in the last 2 yrs please stop smoking, stop any regular Alcohol  and or any  Recreational drug use.  Wear Seat belts while driving.   Please note  You were cared for by a hospitalist during your hospital stay. If you have any questions about your discharge medications or the care you received while you were in the hospital after you are discharged, you can call the unit and asked to speak with the hospitalist on call if the hospitalist that took care of you is not available. Once you are discharged, your primary care physician will handle any further medical issues. Please note that NO REFILLS for any discharge medications will be authorized once you are discharged, as it is imperative that you return to your primary care physician (or establish a relationship with a primary care physician if you do not have one) for your aftercare needs so that they can reassess your need for medications and monitor your lab values.

## 2015-06-14 NOTE — Progress Notes (Signed)
[facility] ROUNDING NOTE   Subjective:   Interval History: undergoing 2 D Echo in room -- many questions about BP control -  This is labile with some symptomatic hypotension  Objective:  Vital signs in last 24 hours:  Temp:  [97.4 F (36.3 C)-97.9 F (36.6 C)] 97.4 F (36.3 C) (11/18 0810) Pulse Rate:  [72-85] 85 (11/18 0810) Resp:  [15-23] 23 (11/18 0810) BP: (104-198)/(67-133) 198/133 mmHg (11/18 0810) SpO2:  [96 %-100 %] 96 % (11/18 0810) Weight:  [65.862 kg (145 lb 3.2 oz)] 65.862 kg (145 lb 3.2 oz) (11/18 0418)  Weight change: -0.138 kg (-4.9 oz) Filed Weights   06/12/15 2000 06/13/15 0401 06/14/15 0418  Weight: 66 kg (145 lb 8.1 oz) 65 kg (143 lb 4.8 oz) 65.862 kg (145 lb 3.2 oz)    Intake/Output: I/O last 3 completed shifts: In: 4019.3 [P.O.:1540; I.V.:1904.3; Other:525; IV Piggyback:50] Out: 2850 [Urine:2850]   Intake/Output this shift:  Total I/O In: -  Out: 300 [Urine:300]  General: Alert, Well-developed, well-nourished, pleasant and cooperative in NAD Head: Normocephalic and atraumatic. Neck: Supple; no masses or thyromegaly. JVP not elevated Lungs: Clear throughout to auscultation. No wheezes, crackles, or rhonchi. No acute distress. Heart: Regular rate and rhythm; no murmurs, clicks, rubs, or gallops. Abdomen: Soft, nontender and nondistended. No masses, hepatosplenomegaly or hernias noted. Normal bowel sounds, without guarding, and without rebound.  Msk: Symmetrical without gross deformities. Normal posture. Extremities: Without clubbing or edema. Right AVF   Basic Metabolic Panel:  Recent Labs Lab 06/12/15 1613 06/13/15 0810 06/13/15 0840 06/14/15 0323  NA 134*  --  130* 134*  K 2.9*  --  3.0* 3.5  CL 88*  --  88* 93*  CO2 31  --  27 26  GLUCOSE 129*  --  251* 183*  BUN 78*  --  85* 81*  CREATININE 5.12*  --  5.15* 4.37*  CALCIUM 8.8*  --  8.2* 8.6*  MG  --  1.8  --   --     Liver Function Tests:  Recent  Labs Lab 06/12/15 1613 06/13/15 0840  AST 23 21  ALT 19 17  ALKPHOS 117 103  BILITOT 0.7 0.7  PROT 7.1 6.8  ALBUMIN 3.0* 2.8*   No results for input(s): LIPASE, AMYLASE in the last 168 hours. No results for input(s): AMMONIA in the last 168 hours.  CBC:  Recent Labs Lab 06/12/15 1415 06/13/15 0840  WBC 7.2 7.1  NEUTROABS 4.7  --   HGB 12.6 10.4*  HCT 38.2 31.3*  MCV 88.8 88.9  PLT 297 257    Cardiac Enzymes:  Recent Labs Lab 06/12/15 2226 06/13/15 0221 06/13/15 0840  TROPONINI 0.05* 0.05* 0.04*    BNP: Invalid input(s): POCBNP  CBG:  Recent Labs Lab 06/12/15 2118 06/13/15 0828 06/13/15 1201 06/13/15 1648 06/13/15 2101  GLUCAP 384* 81* 63* 290* 278*    Microbiology: Results for orders placed or performed during the hospital encounter of 06/12/15  MRSA PCR Screening     Status: Abnormal   Collection Time: 06/12/15 11:01 PM  Result Value Ref Range Status   MRSA by PCR POSITIVE (A) NEGATIVE Final    Comment:        The GeneXpert MRSA Assay (FDA approved for NASAL specimens only), is one component of a comprehensive MRSA colonization surveillance program. It is not intended to diagnose MRSA infection nor to guide or monitor treatment for MRSA infections. RESULT CALLED TO, READ BACK BY AND VERIFIED WITH: K  [facility] RN [id number] 06/13/15 A BROWNING     Coagulation Studies:  Recent Labs  06/13/15 0840  LABPROT 13.9  INR 1.05    Urinalysis:  Recent Labs  06/12/15 1622  COLORURINE YELLOW  LABSPEC 1.016  PHURINE 6.0  GLUCOSEU 250*  HGBUR NEGATIVE  BILIRUBINUR NEGATIVE  KETONESUR NEGATIVE  PROTEINUR >300*  NITRITE NEGATIVE  LEUKOCYTESUR NEGATIVE      Imaging: US Renal  06/13/2015  CLINICAL DATA:  Acute renal failure EXAM: RENAL / URINARY TRACT ULTRASOUND COMPLETE COMPARISON:  Renal ultrasound 02/16/2014 FINDINGS: Right Kidney: Length: 11.5 cm. Echogenicity within normal limits. No mass or hydronephrosis visualized. Left  Kidney: Length: 11.8 cm. Echogenicity within normal limits. No mass or hydronephrosis visualized. Bladder: Appears normal for degree of bladder distention. IMPRESSION: Negative renal ultrasound Electronically Signed   By: Franchot Gallo M.D.   On: 06/13/2015 15:19   Nm Pulmonary Perf And Vent  06/12/2015  CLINICAL DATA:  Chronic cough, with acute onset of left breast soreness and nausea. Initial encounter. EXAM: NUCLEAR MEDICINE VENTILATION - PERFUSION LUNG SCAN TECHNIQUE: Ventilation images were obtained in multiple projections using inhaled aerosol Tc-62m DTPA. Perfusion images were obtained in multiple projections after intravenous injection of Tc-38m MAA. RADIOPHARMACEUTICALS:  31.7 mCi Technetium-64m DTPA aerosol inhalation and 4.33 mCi Technetium-67m MAA IV COMPARISON:  Chest radiograph performed earlier today at 2:42 p.m. FINDINGS: Ventilation: No focal ventilation defect. Perfusion: No wedge shaped peripheral perfusion defects to suggest acute pulmonary embolism. IMPRESSION: Normal V/Q scan.  No evidence for pulmonary embolus. Electronically Signed   By: Garald Balding M.D.   On: 06/12/2015 23:56   Dg Chest Portable 1 View  06/12/2015  CLINICAL DATA:  Chest pain on the left for 2 days, cough EXAM: PORTABLE CHEST - 1 VIEW COMPARISON:  03/15/2015 FINDINGS: Cardiac shadow is within normal limits. The lungs are well aerated bilaterally. No focal infiltrate or sizable effusion is seen. Aortic calcifications are again noted. IMPRESSION: No acute abnormality noted. Electronically Signed   By: Inez Catalina M.D.   On: 06/12/2015 14:59     Medications:   . sodium chloride 75 mL/hr at 06/14/15 0051   . aspirin  81 mg Oral Daily  . atorvastatin  20 mg Oral q1800  . budesonide-formoterol  1 puff Inhalation QPM  . calcitRIOL  0.25 mcg Oral Daily  . carvedilol  6.25 mg Oral BID WC  . cefTRIAXone (ROCEPHIN)  IV  1 g Intravenous Q24H  . Chlorhexidine Gluconate Cloth  6 each Topical Q0600  . ferrous  sulfate  325 mg Oral Q breakfast  . heparin  5,000 Units Subcutaneous 3 times per day  . insulin aspart  0-9 Units Subcutaneous TID WC  . insulin detemir  5 Units Subcutaneous QHS  . isosorbide-hydrALAZINE  2 tablet Oral TID  . mupirocin ointment  1 application Nasal BID  . pantoprazole  40 mg Oral Daily  . sodium chloride  3 mL Intravenous Q12H  . thiamine  100 mg Oral Daily   acetaminophen **OR** [DISCONTINUED] acetaminophen, chlorpheniramine-HYDROcodone, dextromethorphan, hydrALAZINE, [DISCONTINUED] ondansetron **OR** ondansetron (ZOFRAN) IV, technetium TC 52M diethylenetriame-pentaacetic acid  Assessment/ Plan:  1. Chronic renal disease stage 5 with low GFR  Secondary to diabetes Followed by Dr Lorrene Reid Not uremic and no real urgent indications for dialysis Renally adjust medications Serial chemistries: creatinine lower this AM  2.  Hypertension Appears stable secondary to chronic kidney disease Continue current medications to control-- labile -- Amlodipine and clonidine stopped   Lower dose of coreg and  continues on bidil   ( patient is going to self monitor BP at home and call )    3. Secondary hyperparathyroidism Continue vitamin D analogue Follow calcium and phosporus   4. Anemia Hb goal 10-12  Was receiving darbepoietin in clinic    5. Vascular Access  Mature and ready Thrill and Bruit   No indications for dialysis at this point and will have Dr Lorrene Reid follow up on Wednesday   LOS: 1 Destiny Day W @TODAY @11 :21 AM

## 2015-06-14 NOTE — Discharge Summary (Signed)
Destiny Day, is a 64 y.o. female  DOB [birth year]  MRN [id number].  Admission date:  06/12/2015  Admitting Physician  Bonnielee Haff, MD  Discharge Date:  06/14/2015   Primary MD  Velna Hatchet, MD  Recommendations for primary care physician for things to follow:   Monitor blood pressure, weight and BMP closely. Close outpatient renal follow-up.   Admission Diagnosis  Hypothermia, initial encounter [T68.XXXA]   Discharge Diagnosis  Hypothermia, initial encounter [T68.XXXA]    Principal Problem:   Hypothermia Active Problems:   Essential hypertension   Chronic diastolic CHF (congestive heart failure) (HCC)   Type 2 diabetes mellitus with diabetic nephropathy (HCC)   CKD (chronic kidney disease) stage 3, GFR 30-59 ml/min   Acute on chronic renal failure (HCC)   ARF (acute renal failure) (HCC)      Past Medical History  Diagnosis Date  . Hypertension   . Diverticulitis   . Hyperlipidemia   . Asthma   . Diabetes mellitus     insulin dependent  . Arthritis     knee  . GI bleed 03/31/2013  . CHF (congestive heart failure) ([place])   . Osteoporosis   . Seasonal allergies   . Anxiety   . Abdominal bruit   . Chronic kidney disease     stage 3  . Sleep apnea     can't afford cpap  . Pneumonia   . GERD (gastroesophageal reflux disease)     from medications  . Complication of anesthesia     " after I got home from my last procedure, I started itching."  . Anemia     Past Surgical History  Procedure Laterality Date  . Abdominal hysterectomy  1993`  . Esophagogastroduodenoscopy N/A 03/31/2013    Procedure: ESOPHAGOGASTRODUODENOSCOPY (EGD);  Surgeon: Gatha Mayer, MD;  Location: [facility] ENDOSCOPY;  Service: Endoscopy;  Laterality: N/A;  . Eye surgery      laser surgery  . Bascilic vein  transposition Left 07/10/2014    Procedure: BASCILIC VEIN TRANSPOSITION;  Surgeon: Angelia Mould, MD;  Location: [facility];  Service: Vascular;  Laterality: Left;  . Fistulogram Left 10/29/2014    Procedure: FISTULOGRAM;  Surgeon: Angelia Mould, MD;  Location: [facility] CATH LAB;  Service: Cardiovascular;  Laterality: Left;  . Bascilic vein transposition Right 11/08/2014    Procedure: FIRST STAGE BASILIC VEIN TRANSPOSITION;  Surgeon: Angelia Mould, MD;  Location: [place];  Service: Vascular;  Laterality: Right;  . Bascilic vein transposition Right 01/18/2015    Procedure: SECOND STAGE BASILIC VEIN TRANSPOSITION;  Surgeon: Angelia Mould, MD;  Location: [place];  Service: Vascular;  Laterality: Right;  . Patch angioplasty Right 01/18/2015    Procedure: BASILIC VEIN PATCH ANGIOPLASTY USING VASCUGUARD PATCH;  Surgeon: Angelia Mould, MD;  Location: [facility] OR;  Service: Vascular;  Laterality: Right;       HPI  from the history and physical done on the day of admission:    Destiny Day is a 64 y.o. female with  a past medical history of chronic kidney disease, hypertension, diabetes, who was in her usual state of health till a few weeks ago when she started coughing. The symptoms were on and off. She denies any yellowish expectoration. No blood in the sputum. No fever. And then over the past 2-3 days she's noticed pain in the left side of her chest radiating to the arm. The pain gets worse with movement. Denies any falls or injuries. She denies any leg swelling. No nausea, vomiting. No abdominal pain. No dizziness or lightheadedness. No exposure to cold recently. No recent changes to her medications. She has noticed poor appetite in the last few days. In the emergency department she was found to have a temperature 43F. She is also noted to have acute on chronic kidney disease. Medicine was consulted for further management.    Hospital Course:   1. Hyponatremia. Reason unclear but  now completely resolved, could have been due to dehydration and hypovolemia, no clinical signs of sepsis, there was questionable UTI but no sepsis, TSH and cortisol stable. Completely stable on room temperature without any vomiting measures for 24 hours.   2. 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 Baseline creatinine around 2.4. Likely due to combination of hypotension and dehydration, improved after hydration seen and cleared by renal for discharge, have stopped her clonidine and Norvasc, cut her Coreg dose into half, changed BiDil from 3 times a day to twice a day, hold diuretics for another 3 days, follow with PCP and nephrology closely post discharge. She has some nausea for the last 3 months and I suspect this is due to uremia, she was resistant for dialysis and prefers to follow with her nephrologist postdischarge. Nausea medications provided.   Request PCP to continue monitoring blood pressure closely, patient says that she has been running low blood pressures for weeks now at home.   3. Hypotension. Likely reflective of dehydration. Afebrile, nontoxic, no leukocytosis, normal lactate, cut down on blood pressure medications as above, been hydrated and improved. TSH cortisol stable.  4. Musculoskeletal left-sided chest pain due to chronic cough. EKG nonacute, VQ scan normal, will obtain echogram to evaluate wall motion. Mild troponin rise is in non-ACS pattern and likely secondary to renal failure. Currently on aspirin and Coreg along with statin which will be continued.   5. ? UTI - UA borderline, was given Rocephin 1 dose will give another couple of doses of Vantin upon discharge.   6. 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 In 2 new home regimen follow with PCP.     Discharge Condition: Fair  Follow UP  Follow-up Information    Follow up with Velna Hatchet, MD. Schedule an appointment as soon as possible for a visit in 3 days.   Specialty:  Internal Medicine   Contact information:   [place] [address] [phone]       Follow up with DUNHAM,CYNTHIA B, MD. Schedule an appointment as soon as possible for a visit in 1 week.   Specialty:  Nephrology   Contact information:   [place] [address] [phone]        Consults obtained - Renal  Diet and Activity recommendation: See Discharge Instructions below  Discharge Instructions         Discharge Instructions    Discharge instructions    Complete by:  As directed   Follow with Primary MD Velna Hatchet, MD in 7 days   Get CBC, CMP, 2 view Chest  X ray checked  by Primary MD next visit.    Activity: As tolerated with Full fall precautions use walker/cane & assistance as needed   Disposition Home     Diet: Renal Low Carb. Check your Weight same time everyday, if you gain over 2 pounds, or you develop in leg swelling, experience more shortness of breath or chest pain, call your Primary MD immediately. Follow Cardiac Low Salt Diet and 1.5 lit/day fluid restriction.   On your next visit with your primary care physician please Get Medicines reviewed and adjusted.   Please request your Prim.MD to go over all Hospital Tests and Procedure/Radiological results at the follow up, please get all Hospital records sent to your Prim MD by signing hospital release before you go home.   If you experience worsening of your admission symptoms, develop shortness of breath, life threatening emergency, suicidal or homicidal thoughts you must seek medical attention immediately by calling 911 or calling your MD immediately  if symptoms less severe.  You Must read complete instructions/literature along with all the possible adverse reactions/side effects for all the Medicines you take and that have been prescribed to you. Take any new Medicines after you have completely understood and accpet all the possible adverse reactions/side effects.   Do not drive, operating heavy machinery, perform  activities at heights, swimming or participation in water activities or provide baby sitting services if your were admitted for syncope or siezures until you have seen by Primary MD or a Neurologist and advised to do so again.  Do not drive when taking Pain medications.    Do not take more than prescribed Pain, Sleep and Anxiety Medications  Special Instructions: If you have smoked or chewed Tobacco  in the last 2 yrs please stop smoking, stop any regular Alcohol  and or any Recreational drug use.  Wear Seat belts while driving.   Please note  You were cared for by a hospitalist during your hospital stay. If you have any questions about your discharge medications or the care you received while you were in the hospital after you are discharged, you can call the unit and asked to Day with the hospitalist on call if the hospitalist that took care of you is not available. Once you are discharged, your primary care physician will handle any further medical issues. Please note that NO REFILLS for any discharge medications will be authorized once you are discharged, as it is imperative that you return to your primary care physician (or establish a relationship with a primary care physician if you do not have one) for your aftercare needs so that they can reassess your need for medications and monitor your lab values.     Increase activity slowly    Complete by:  As directed              Discharge Medications       Medication List    STOP taking these medications        amLODipine 10 MG tablet  Commonly known as:  NORVASC     cloNIDine 0.1 MG tablet  Commonly known as:  CATAPRES      TAKE these medications        albuterol 108 (90 BASE) MCG/ACT inhaler  Commonly known as:  PROVENTIL HFA;VENTOLIN HFA  Inhale 1-2 puffs into the lungs every 6 (six) hours as needed for wheezing or shortness of breath.     aspirin 81 MG chewable tablet  Chew 81 mg by  mouth daily.     atorvastatin  20 MG tablet  Commonly known as:  LIPITOR  Take 20 mg by mouth daily at 6 PM.     budesonide-formoterol 160-4.5 MCG/ACT inhaler  Commonly known as:  SYMBICORT  Inhale 1 puff into the lungs every evening.     calcitRIOL 0.25 MCG capsule  Commonly known as:  ROCALTROL  Take 0.25 mcg by mouth daily.     carvedilol 12.5 MG tablet  Commonly known as:  COREG  Take 1 tablet (12.5 mg total) by mouth 2 (two) times daily with a meal.     cefpodoxime 200 MG tablet  Commonly known as:  VANTIN  Take 1 tablet (200 mg total) by mouth 2 (two) times daily.     clotrimazole 1 % cream  Commonly known as:  LOTRIMIN  Apply 1 application topically 2 (two) times daily.     ferrous sulfate 325 (65 FE) MG tablet  Take 325 mg by mouth daily with breakfast.     insulin aspart 100 UNIT/ML injection  Commonly known as:  novoLOG  Inject 0-8 Units into the skin 3 (three) times daily before meals. Sliding scale     isosorbide-hydrALAZINE 20-37.5 MG tablet  Commonly known as:  BIDIL  Take 2 tablets by mouth 2 (two) times daily.     LEVEMIR FLEXTOUCH 100 UNIT/ML Pen  Generic drug:  Insulin Detemir  Inject 10 Units into the muscle at bedtime.     loratadine 10 MG tablet  Commonly known as:  CLARITIN  Take 10 mg by mouth daily as needed for allergies.     metolazone 2.5 MG tablet  Commonly known as:  ZAROXOLYN  Take 1 tablet (2.5 mg total) by mouth every 3 (three) days.  Start taking on:  06/17/2015     multivitamin Tabs tablet  Take 1 tablet by mouth daily.     omeprazole 20 MG capsule  Commonly known as:  PRILOSEC  Take 20 mg by mouth daily.     ondansetron 4 MG tablet  Commonly known as:  ZOFRAN  Take 1 tablet (4 mg total) by mouth every 8 (eight) hours as needed for nausea or vomiting.     potassium chloride SA 20 MEQ tablet  Commonly known as:  K-DUR,KLOR-CON  Take 1 tablet (20 mEq total) by mouth 2 (two) times daily.  Start taking on:  06/17/2015     promethazine 25 MG suppository    Commonly known as:  PHENERGAN  Place 1 suppository (25 mg total) rectally every 6 (six) hours as needed for nausea or vomiting.     torsemide 100 MG tablet  Commonly known as:  DEMADEX  Take 1 tablet (100 mg total) by mouth daily.  Start taking on:  06/17/2015     Vitamin D (Ergocalciferol) 50000 UNITS Caps capsule  Commonly known as:  DRISDOL  Take 50,000 Units by mouth every 7 (seven) days. Mondays        Major procedures and Radiology Reports - PLEASE review detailed and final reports for all details, in brief -      US Renal  06/13/2015  CLINICAL DATA:  Acute renal failure EXAM: RENAL / URINARY TRACT ULTRASOUND COMPLETE COMPARISON:  Renal ultrasound 02/16/2014 FINDINGS: Right Kidney: Length: 11.5 cm. Echogenicity within normal limits. No mass or hydronephrosis visualized. Left Kidney: Length: 11.8 cm. Echogenicity within normal limits. No mass or hydronephrosis visualized. Bladder: Appears normal for degree of bladder distention. IMPRESSION: Negative renal ultrasound Electronically Signed  By: Franchot Gallo M.D.   On: 06/13/2015 15:19   Nm Pulmonary Perf And Vent  06/12/2015  CLINICAL DATA:  Chronic cough, with acute onset of left breast soreness and nausea. Initial encounter. EXAM: NUCLEAR MEDICINE VENTILATION - PERFUSION LUNG SCAN TECHNIQUE: Ventilation images were obtained in multiple projections using inhaled aerosol Tc-81m DTPA. Perfusion images were obtained in multiple projections after intravenous injection of Tc-39m MAA. RADIOPHARMACEUTICALS:  31.7 mCi Technetium-50m DTPA aerosol inhalation and 4.33 mCi Technetium-34m MAA IV COMPARISON:  Chest radiograph performed earlier today at 2:42 p.m. FINDINGS: Ventilation: No focal ventilation defect. Perfusion: No wedge shaped peripheral perfusion defects to suggest acute pulmonary embolism. IMPRESSION: Normal V/Q scan.  No evidence for pulmonary embolus. Electronically Signed   By: Garald Balding M.D.   On: 06/12/2015 23:56   Dg  Chest Portable 1 View  06/12/2015  CLINICAL DATA:  Chest pain on the left for 2 days, cough EXAM: PORTABLE CHEST - 1 VIEW COMPARISON:  03/15/2015 FINDINGS: Cardiac shadow is within normal limits. The lungs are well aerated bilaterally. No focal infiltrate or sizable effusion is seen. Aortic calcifications are again noted. IMPRESSION: No acute abnormality noted. Electronically Signed   By: Inez Catalina M.D.   On: 06/12/2015 14:59    Micro Results      Recent Results (from the past 240 hour(s))  MRSA PCR Screening     Status: Abnormal   Collection Time: 06/12/15 11:01 PM  Result Value Ref Range Status   MRSA by PCR POSITIVE (A) NEGATIVE Final    Comment:        The GeneXpert MRSA Assay (FDA approved for NASAL specimens only), is one component of a comprehensive MRSA colonization surveillance program. It is not intended to diagnose MRSA infection nor to guide or monitor treatment for MRSA infections. RESULT CALLED TO, READ BACK BY AND VERIFIED WITHDolan Amen RN [id number] 06/13/15 A BROWNING        Today   Subjective    Destiny Day today has no headache,no chest abdominal pain,no new weakness tingling or numbness, feels much better wants to go home today. Mild Nausea.   Objective   Blood pressure 140/78, pulse 72, temperature 97.6 F (36.4 C), temperature source Oral, resp. rate 20, height 5\' 5"  (1.651 m), weight 65.862 kg (145 lb 3.2 oz), SpO2 99 %.   Intake/Output Summary (Last 24 hours) at 06/14/15 0943 Last data filed at 06/14/15 0700  Gross per 24 hour  Intake   2760 ml  Output   1950 ml  Net    810 ml    Exam Awake Alert, Oriented x 3, No new F.N deficits, Normal affect [place].AT,PERRAL Supple Neck,No JVD, No cervical lymphadenopathy appriciated.  Symmetrical Chest wall movement, Good air movement bilaterally, CTAB RRR,No Gallops,Rubs or new Murmurs, No Parasternal Heave +ve B.Sounds, Abd Soft, Non tender, No organomegaly appriciated, No rebound -guarding  or rigidity. No Cyanosis, Clubbing or edema, No new Rash or bruise   Data Review   CBC w Diff:  Lab Results  Component Value Date   WBC 7.1 06/13/2015   HGB 10.4* 06/13/2015   HCT 31.3* 06/13/2015   PLT 257 06/13/2015   LYMPHOPCT 27 06/12/2015   MONOPCT 6 06/12/2015   EOSPCT 2 06/12/2015   BASOPCT 0 06/12/2015    CMP:  Lab Results  Component Value Date   NA 134* 06/14/2015   K 3.5 06/14/2015   CL 93* 06/14/2015   CO2 26 06/14/2015   BUN 81* 06/14/2015  CREATININE 4.37* 06/14/2015   PROT 6.8 06/13/2015   ALBUMIN 2.8* 06/13/2015   BILITOT 0.7 06/13/2015   ALKPHOS 103 06/13/2015   AST 21 06/13/2015   ALT 17 06/13/2015  .   Total Time in preparing paper work, data evaluation and todays exam - 35 minutes  Thurnell Lose M.D on 06/14/2015 at 9:43 AM  [facility]   Office  [phone]

## 2015-06-18 ENCOUNTER — Emergency Department (HOSPITAL_COMMUNITY): Payer: BC Managed Care – PPO

## 2015-06-18 ENCOUNTER — Emergency Department (HOSPITAL_COMMUNITY)
Admission: EM | Admit: 2015-06-18 | Discharge: 2015-06-18 | Disposition: A | Discharge status: Home / Self Care | Payer: BC Managed Care – PPO | Attending: Emergency Medicine | Admitting: Emergency Medicine

## 2015-06-18 ENCOUNTER — Encounter (HOSPITAL_COMMUNITY): Payer: Self-pay | Admitting: Emergency Medicine

## 2015-06-18 DIAGNOSIS — R531 Weakness: Secondary | ICD-10-CM | POA: Diagnosis not present

## 2015-06-18 DIAGNOSIS — R55 Syncope and collapse: Secondary | ICD-10-CM | POA: Insufficient documentation

## 2015-06-18 DIAGNOSIS — Z792 Long term (current) use of antibiotics: Secondary | ICD-10-CM | POA: Insufficient documentation

## 2015-06-18 DIAGNOSIS — R110 Nausea: Secondary | ICD-10-CM | POA: Insufficient documentation

## 2015-06-18 DIAGNOSIS — N183 Chronic kidney disease, stage 3 (moderate): Secondary | ICD-10-CM | POA: Diagnosis not present

## 2015-06-18 DIAGNOSIS — R5383 Other fatigue: Secondary | ICD-10-CM | POA: Insufficient documentation

## 2015-06-18 DIAGNOSIS — K219 Gastro-esophageal reflux disease without esophagitis: Secondary | ICD-10-CM | POA: Insufficient documentation

## 2015-06-18 DIAGNOSIS — M1990 Unspecified osteoarthritis, unspecified site: Secondary | ICD-10-CM | POA: Insufficient documentation

## 2015-06-18 DIAGNOSIS — I509 Heart failure, unspecified: Secondary | ICD-10-CM | POA: Diagnosis not present

## 2015-06-18 DIAGNOSIS — J45901 Unspecified asthma with (acute) exacerbation: Secondary | ICD-10-CM | POA: Diagnosis not present

## 2015-06-18 DIAGNOSIS — E119 Type 2 diabetes mellitus without complications: Secondary | ICD-10-CM | POA: Diagnosis not present

## 2015-06-18 DIAGNOSIS — Z87891 Personal history of nicotine dependence: Secondary | ICD-10-CM | POA: Insufficient documentation

## 2015-06-18 DIAGNOSIS — Z7982 Long term (current) use of aspirin: Secondary | ICD-10-CM | POA: Insufficient documentation

## 2015-06-18 DIAGNOSIS — Z8719 Personal history of other diseases of the digestive system: Secondary | ICD-10-CM | POA: Insufficient documentation

## 2015-06-18 DIAGNOSIS — Z8701 Personal history of pneumonia (recurrent): Secondary | ICD-10-CM | POA: Insufficient documentation

## 2015-06-18 DIAGNOSIS — I129 Hypertensive chronic kidney disease with stage 1 through stage 4 chronic kidney disease, or unspecified chronic kidney disease: Secondary | ICD-10-CM | POA: Diagnosis not present

## 2015-06-18 DIAGNOSIS — F419 Anxiety disorder, unspecified: Secondary | ICD-10-CM | POA: Diagnosis not present

## 2015-06-18 DIAGNOSIS — Z79899 Other long term (current) drug therapy: Secondary | ICD-10-CM | POA: Diagnosis not present

## 2015-06-18 DIAGNOSIS — Z7951 Long term (current) use of inhaled steroids: Secondary | ICD-10-CM | POA: Diagnosis not present

## 2015-06-18 DIAGNOSIS — E785 Hyperlipidemia, unspecified: Secondary | ICD-10-CM | POA: Diagnosis not present

## 2015-06-18 DIAGNOSIS — Z794 Long term (current) use of insulin: Secondary | ICD-10-CM | POA: Diagnosis not present

## 2015-06-18 DIAGNOSIS — R0602 Shortness of breath: Secondary | ICD-10-CM | POA: Diagnosis present

## 2015-06-18 LAB — BASIC METABOLIC PANEL
Anion gap: 12 (ref 5–15)
BUN: 67 mg/dL — ABNORMAL HIGH (ref 6–20)
CO2: 29 mmol/L (ref 22–32)
Calcium: 9.3 mg/dL (ref 8.9–10.3)
Chloride: 92 mmol/L — ABNORMAL LOW (ref 101–111)
Creatinine, Ser: 4.28 mg/dL — ABNORMAL HIGH (ref 0.44–1.00)
GFR calc Af Amer: 12 mL/min — ABNORMAL LOW (ref >60)
GFR calc non Af Amer: 10 mL/min — ABNORMAL LOW (ref >60)
Glucose, Bld: 271 mg/dL — ABNORMAL HIGH (ref 65–99)
Potassium: 3.6 mmol/L (ref 3.5–5.1)
Sodium: 133 mmol/L — ABNORMAL LOW (ref 135–145)

## 2015-06-18 LAB — CBC
HCT: 34.9 % — ABNORMAL LOW (ref 36.0–46.0)
Hemoglobin: 11.8 g/dL — ABNORMAL LOW (ref 12.0–15.0)
MCH: 30 pg (ref 26.0–34.0)
MCHC: 33.8 g/dL (ref 30.0–36.0)
MCV: 88.8 fL (ref 78.0–100.0)
Platelets: 280 10*3/uL (ref 150–400)
RBC: 3.93 MIL/uL (ref 3.87–5.11)
RDW: 12.8 % (ref 11.5–15.5)
WBC: 8.1 10*3/uL (ref 4.0–10.5)

## 2015-06-18 LAB — CBG MONITORING, ED: Glucose-Capillary: 261 mg/dL — ABNORMAL HIGH (ref 65–99)

## 2015-06-18 LAB — I-STAT TROPONIN, ED: Troponin i, poc: 0.07 ng/mL (ref 0.00–0.08)

## 2015-06-18 MED ORDER — ONDANSETRON HCL 4 MG PO TABS: 4.0000 mg | ORAL_TABLET | Freq: Four times a day (QID) | ORAL | Status: DC

## 2015-06-18 NOTE — ED Notes (Signed)
[id number]

## 2015-06-18 NOTE — Discharge Instructions (Signed)
Near-Syncope Near-syncope (commonly known as near fainting) is sudden weakness, dizziness, or feeling like you might pass out. During an episode of near-syncope, you may also develop pale skin, have tunnel vision, or feel sick to your stomach (nauseous). Near-syncope may occur when getting up after sitting or while standing for a long time. It is caused by a sudden decrease in blood flow to the brain. This decrease can result from various causes or triggers, most of which are not serious. However, because near-syncope can sometimes be a sign of something serious, a medical evaluation is required. The specific cause is often not determined. HOME CARE INSTRUCTIONS  Monitor your condition for any changes. The following actions may help to alleviate any discomfort you are experiencing:  Have someone stay with you until you feel stable.  Lie down right away and prop your feet up if you start feeling like you might faint. Breathe deeply and steadily. Wait until all the symptoms have passed. Most of these episodes last only a few minutes. You may feel tired for several hours.   Drink enough fluids to keep your urine clear or pale yellow.   If you are taking blood pressure or heart medicine, get up slowly when seated or lying down. Take several minutes to sit and then stand. This can reduce dizziness.  Follow up with your health care provider as directed. SEEK IMMEDIATE MEDICAL CARE IF:   You have a severe headache.   You have unusual pain in the chest, abdomen, or back.   You are bleeding from the mouth or rectum, or you have black or tarry stool.   You have an irregular or very fast heartbeat.   You have repeated fainting or have seizure-like jerking during an episode.   You faint when sitting or lying down.   You have confusion.   You have difficulty walking.   You have severe weakness.   You have vision problems.  MAKE SURE YOU:   Understand these instructions.  Will  watch your condition.  Will get help right away if you are not doing well or get worse.   This information is not intended to replace advice given to you by your health care provider. Make sure you discuss any questions you have with your health care provider.   Document Released: 07/13/2005 Document Revised: 07/18/2013 Document Reviewed: 12/16/2012 Elsevier Interactive Patient Education 2016 [employer]  

## 2015-06-18 NOTE — ED Provider Notes (Signed)
CSN: [id number]     Arrival date & time 06/18/15  1948 History  By signing my name below, I, Emmanuella Mensah, attest that this documentation has been prepared under the direction and in the presence of Orpah Greek, MD. Electronically Signed: Judithann Sauger, ED Scribe. 06/18/2015. 11:29 PM.    Chief Complaint  Patient presents with  . Shortness of Breath   The history is provided by the patient. No language interpreter was used.   HPI Comments: Destiny Day is a 64 y.o. female with a hx of HTN, HLD, Asthma, DM, CHF, chronic kidney disease, and anemia who presents to the Emergency Department complaining of gradually worsening intermittent episodes of dizziness onset tonight. She reports associated fatigue, weakness, and nausea. She denies any vomiting, leg swelling, or any pain. She states that she has been taking Phenergan for the nausea. She reports that she was admitted to the hospital on 06/12/15 and discharged on 06/14/15 due to a drop in BP two days in a row with CHF. Pt has an appointment with Nephrology tomorrow.    Past Medical History  Diagnosis Date  . Hypertension   . Diverticulitis   . Hyperlipidemia   . Asthma   . Diabetes mellitus     insulin dependent  . Arthritis     knee  . GI bleed 03/31/2013  . CHF (congestive heart failure) ([place])   . Osteoporosis   . Seasonal allergies   . Anxiety   . Abdominal bruit   . Chronic kidney disease     stage 3  . Sleep apnea     can't afford cpap  . Pneumonia   . GERD (gastroesophageal reflux disease)     from medications  . Complication of anesthesia     " after I got home from my last procedure, I started itching."  . Anemia    Past Surgical History  Procedure Laterality Date  . Abdominal hysterectomy  1993`  . Esophagogastroduodenoscopy N/A 03/31/2013    Procedure: ESOPHAGOGASTRODUODENOSCOPY (EGD);  Surgeon: Gatha Mayer, MD;  Location: [facility] ENDOSCOPY;  Service: Endoscopy;  Laterality: N/A;  . Eye  surgery      laser surgery  . Bascilic vein transposition Left 07/10/2014    Procedure: BASCILIC VEIN TRANSPOSITION;  Surgeon: Angelia Mould, MD;  Location: [place];  Service: Vascular;  Laterality: Left;  . Fistulogram Left 10/29/2014    Procedure: FISTULOGRAM;  Surgeon: Angelia Mould, MD;  Location[facility] C[facility];  Service: Cardiovascular;  Laterality: Left;  . Bascilic vein transposition Right 11/08/2014    Procedure: FIRST STAGE BASILIC VEIN TRANSPOSITION;  Surgeon: Angelia Mould, MD;  Location: [place];  Service: Vascular;  Laterality: Right;  . Bascilic vein transposition Right 01/18/2015    Procedure: SECOND STAGE BASILIC VEIN TRANSPOSITION;  Surgeon: Angelia Mould, MD;  Location: [facility] OR;  Service: Vascular;  Laterality: Right;  . Patch angioplasty Right 01/18/2015    Procedure: BASILIC VEIN PATCH ANGIOPLASTY USING VASCUGUARD PATCH;  Surgeon: Angelia Mould, MD;  Location: [facility] OR;  Service: Vascular;  Laterality: Right;   Family History  Problem Relation Age of Onset  . Colon cancer Neg Hx   . Other Mother     not sure of cause of death  . Diabetes Father   . Pancreatic cancer Maternal Grandmother    Social History  Substance Use Topics  . Smoking status: Former Smoker -- 0.35 packs/day for 40 years    Types: Cigarettes    Quit  date: 05/10/2012  . Smokeless tobacco: Never Used  . Alcohol Use: No   OB History    No data available     Review of Systems  Constitutional: Positive for fatigue.  Cardiovascular: Negative for chest pain.  Gastrointestinal: Positive for nausea. Negative for vomiting and abdominal pain.  Musculoskeletal: Negative for myalgias, joint swelling and arthralgias.  Neurological: Positive for dizziness and weakness.  All other systems reviewed and are negative.     Allergies  Lisinopril and Prednisone  Home Medications   Prior to Admission medications   Medication Sig Start Date End Date Taking? Authorizing Provider   albuterol (PROVENTIL HFA;VENTOLIN HFA) 108 (90 BASE) MCG/ACT inhaler Inhale 1-2 puffs into the lungs every 6 (six) hours as needed for wheezing or shortness of breath.    Historical Provider, MD  aspirin 81 MG chewable tablet Chew 81 mg by mouth daily.    Historical Provider, MD  atorvastatin (LIPITOR) 20 MG tablet Take 20 mg by mouth daily at 6 PM.    Historical Provider, MD  budesonide-formoterol (SYMBICORT) 160-4.5 MCG/ACT inhaler Inhale 1 puff into the lungs every evening.    Historical Provider, MD  calcitRIOL (ROCALTROL) 0.25 MCG capsule Take 0.25 mcg by mouth daily.    Historical Provider, MD  carvedilol (COREG) 12.5 MG tablet Take 1 tablet (12.5 mg total) by mouth 2 (two) times daily with a meal. 06/14/15   Thurnell Lose, MD  cefpodoxime (VANTIN) 200 MG tablet Take 1 tablet (200 mg total) by mouth 2 (two) times daily. 06/14/15   Thurnell Lose, MD  clotrimazole (LOTRIMIN) 1 % cream Apply 1 application topically 2 (two) times daily. 06/11/15   Max T Hyatt, DPM  ferrous sulfate 325 (65 FE) MG tablet Take 325 mg by mouth daily with breakfast.    Historical Provider, MD  insulin aspart (NOVOLOG) 100 UNIT/ML injection Inject 0-8 Units into the skin 3 (three) times daily before meals. Sliding scale    Historical Provider, MD  isosorbide-hydrALAZINE (BIDIL) 20-37.5 MG tablet Take 2 tablets by mouth 2 (two) times daily. 06/14/15   Thurnell Lose, MD  LEVEMIR FLEXTOUCH 100 UNIT/ML Pen Inject 10 Units into the muscle at bedtime.  01/16/15   Historical Provider, MD  loratadine (CLARITIN) 10 MG tablet Take 10 mg by mouth daily as needed for allergies.    Historical Provider, MD  metolazone (ZAROXOLYN) 2.5 MG tablet Take 1 tablet (2.5 mg total) by mouth every 3 (three) days. 06/17/15   Thurnell Lose, MD  multivitamin (RENA-VIT) TABS tablet Take 1 tablet by mouth daily. 01/24/15   Historical Provider, MD  omeprazole (PRILOSEC) 20 MG capsule Take 20 mg by mouth daily.    Historical Provider, MD   ondansetron (ZOFRAN) 4 MG tablet Take 1 tablet (4 mg total) by mouth every 8 (eight) hours as needed for nausea or vomiting. 06/14/15   Thurnell Lose, MD  potassium chloride SA (K-DUR,KLOR-CON) 20 MEQ tablet Take 1 tablet (20 mEq total) by mouth 2 (two) times daily. 06/17/15   Thurnell Lose, MD  promethazine (PHENERGAN) 25 MG suppository Place 1 suppository (25 mg total) rectally every 6 (six) hours as needed for nausea or vomiting. 06/14/15   Thurnell Lose, MD  torsemide (DEMADEX) 100 MG tablet Take 1 tablet (100 mg total) by mouth daily. 06/17/15   Thurnell Lose, MD  Vitamin D, Ergocalciferol, (DRISDOL) 50000 UNITS CAPS capsule Take 50,000 Units by mouth every 7 (seven) days. Mondays    Historical Provider,  MD   BP 152/99 mmHg  Pulse 87  Temp(Src) 98.1 F (36.7 C) (Oral)  Resp 16  Ht 5\' 6"  (1.676 m)  Wt 145 lb (65.772 kg)  BMI 23.41 kg/m2  SpO2 100% Physical Exam  Constitutional: She is oriented to person, place, and time. She appears well-developed and well-nourished. No distress.  HENT:  Head: Normocephalic and atraumatic.  Right Ear: Hearing normal.  Left Ear: Hearing normal.  Nose: Nose normal.  Mouth/Throat: Oropharynx is clear and moist and mucous membranes are normal.  Eyes: Conjunctivae and EOM are normal. Pupils are equal, round, and reactive to light.  Neck: Normal range of motion. Neck supple.  Cardiovascular: Regular rhythm, S1 normal and S2 normal.  Exam reveals no gallop and no friction rub.   No murmur heard. Pulmonary/Chest: Effort normal and breath sounds normal. No respiratory distress. She exhibits no tenderness.  Abdominal: Soft. Normal appearance and bowel sounds are normal. There is no hepatosplenomegaly. There is no tenderness. There is no rebound, no guarding, no tenderness at McBurney's point and negative Murphy's sign. No hernia.  Musculoskeletal: Normal range of motion.  Neurological: She is alert and oriented to person, place, and time. She  has normal strength. No cranial nerve deficit or sensory deficit. Coordination normal. GCS eye subscore is 4. GCS verbal subscore is 5. GCS motor subscore is 6.  Skin: Skin is warm, dry and intact. No rash noted. No cyanosis.  Psychiatric: She has a normal mood and affect. Her speech is normal and behavior is normal. Thought content normal.  Nursing note and vitals reviewed.   ED Course  Procedures (including critical care time) DIAGNOSTIC STUDIES: Oxygen Saturation is 97% on RA, adequate by my interpretation.    COORDINATION OF CARE: 11:19 PM- Pt advised of plan for treatment and pt agrees. Informed of lab results. Will monitor BP with movement. Advised to honor appointment tomorrow to switch BP medications to the appropriate medications.    Labs Review Labs Reviewed  BASIC METABOLIC PANEL - Abnormal; Notable for the following:    Sodium 133 (*)    Chloride 92 (*)    Glucose, Bld 271 (*)    BUN 67 (*)    Creatinine, Ser 4.28 (*)    GFR calc non Af Amer 10 (*)    GFR calc Af Amer 12 (*)    All other components within normal limits  CBC - Abnormal; Notable for the following:    Hemoglobin 11.8 (*)    HCT 34.9 (*)    All other components within normal limits  CBG MONITORING, ED - Abnormal; Notable for the following:    Glucose-Capillary 261 (*)    All other components within normal limits  I-STAT TROPOININ, ED    Imaging Review Dg Chest 2 View  06/18/2015  CLINICAL DATA:  Shortness of breath for 2 days. Previous smoker. Weakness, fatigue, dizziness, near-syncope. EXAM: CHEST  2 VIEW COMPARISON:  06/12/2015 FINDINGS: Heart size and pulmonary vascularity are normal. Mild emphysematous changes in the lungs with central interstitial change and peribronchial thickening likely due to chronic bronchitis. Suggestion of mild bronchiectasis centrally. No focal airspace disease or consolidation in the lungs. No blunting of costophrenic angles. No pneumothorax. Calcified and tortuous aorta.  Degenerative changes in the spine and shoulders. IMPRESSION: Emphysematous and chronic bronchitic changes in the lungs. No evidence of active pulmonary disease. Electronically Signed   By: Lucienne Capers M.D.   On: 06/18/2015 20:35   Orpah Greek, MD has personally reviewed and  evaluated these images and lab results as part of his medical decision-making.   EKG Interpretation   Date/Time:  Tuesday June 18 2015 19:54:54 EST Ventricular Rate:  91 PR Interval:  162 QRS Duration: 86 QT Interval:  368 QTC Calculation: 452 R Axis:   31 Text Interpretation:  Normal sinus rhythm Septal infarct , age  undetermined Nonspecific ST and T wave abnormality Abnormal ECG nsc  Confirmed by Marlicia Sroka  MD, Jonessa Triplett [phone]) on 06/18/2015 11:02:27 PM      MDM   Final diagnoses:  None   dizziness Near-syncope  Presents to the emergency department for generalized weakness and dizziness with sensation of feeling like she is going to pass out. Patient was hospitalized this past week for hypotension secondary to multiple blood pressure medications and dehydration. She has had 2 of her blood pressure medications stopped. Blood pressure is actually elevated here in the ER today. She is not orthostatic. Examination is unremarkable. She does normally torsemide at home, this was stopped during her hospitalization while she was hydrated. She has restarted her torsemide at home. She does not have any evidence of decompensation here today. Patient has follow-up scheduled tomorrow with nephrology. Blood pressure medications can be addressed at that time. Patient does tell me that she has been taking Phenergan for nausea since her admission, some of her symptoms are likely secondary to the Phenergan. Will switch to Zofran.  I personally performed the services described in this documentation, which was scribed in my presence. The recorded information has been reviewed and is accurate.'  Orpah Greek, MD 06/18/15 [phone]

## 2015-06-18 NOTE — ED Notes (Signed)
Pt states she was discharge from hospital on Friday with congestive heart failure and low bp per pt. Pt states since going home she has just been very weak and fatigue.  Also today started feeling short of breath worse on exertion. Pt states today she felt to weak to walk at home without assistance. Pt denies any pain at this time. Pt is alert and ox4.

## 2015-06-30 ENCOUNTER — Inpatient Hospital Stay (HOSPITAL_COMMUNITY)
Admission: EM | Admit: 2015-06-30 | Discharge: 2015-07-03 | DRG: 304 | Disposition: A | Discharge status: Home / Self Care | Payer: BC Managed Care – PPO | Attending: Internal Medicine | Admitting: Internal Medicine

## 2015-06-30 ENCOUNTER — Emergency Department (HOSPITAL_COMMUNITY): Payer: BC Managed Care – PPO

## 2015-06-30 ENCOUNTER — Encounter (HOSPITAL_COMMUNITY): Payer: Self-pay | Admitting: Emergency Medicine

## 2015-06-30 DIAGNOSIS — Z888 Allergy status to other drugs, medicaments and biological substances status: Secondary | ICD-10-CM

## 2015-06-30 DIAGNOSIS — J9601 Acute respiratory failure with hypoxia: Secondary | ICD-10-CM | POA: Diagnosis present

## 2015-06-30 DIAGNOSIS — F419 Anxiety disorder, unspecified: Secondary | ICD-10-CM | POA: Diagnosis present

## 2015-06-30 DIAGNOSIS — G4730 Sleep apnea, unspecified: Secondary | ICD-10-CM | POA: Diagnosis present

## 2015-06-30 DIAGNOSIS — Z87891 Personal history of nicotine dependence: Secondary | ICD-10-CM | POA: Diagnosis not present

## 2015-06-30 DIAGNOSIS — Z7982 Long term (current) use of aspirin: Secondary | ICD-10-CM | POA: Diagnosis not present

## 2015-06-30 DIAGNOSIS — I161 Hypertensive emergency: Secondary | ICD-10-CM | POA: Diagnosis present

## 2015-06-30 DIAGNOSIS — I160 Hypertensive urgency: Secondary | ICD-10-CM | POA: Diagnosis present

## 2015-06-30 DIAGNOSIS — I5033 Acute on chronic diastolic (congestive) heart failure: Secondary | ICD-10-CM | POA: Diagnosis present

## 2015-06-30 DIAGNOSIS — I10 Essential (primary) hypertension: Secondary | ICD-10-CM | POA: Diagnosis present

## 2015-06-30 DIAGNOSIS — Z7951 Long term (current) use of inhaled steroids: Secondary | ICD-10-CM | POA: Diagnosis not present

## 2015-06-30 DIAGNOSIS — I130 Hypertensive heart and chronic kidney disease with heart failure and stage 1 through stage 4 chronic kidney disease, or unspecified chronic kidney disease: Secondary | ICD-10-CM | POA: Diagnosis present

## 2015-06-30 DIAGNOSIS — Z794 Long term (current) use of insulin: Secondary | ICD-10-CM

## 2015-06-30 DIAGNOSIS — Z833 Family history of diabetes mellitus: Secondary | ICD-10-CM | POA: Diagnosis not present

## 2015-06-30 DIAGNOSIS — J302 Other seasonal allergic rhinitis: Secondary | ICD-10-CM | POA: Diagnosis present

## 2015-06-30 DIAGNOSIS — Z800 Family history of malignant neoplasm of digestive organs: Secondary | ICD-10-CM | POA: Diagnosis not present

## 2015-06-30 DIAGNOSIS — J810 Acute pulmonary edema: Secondary | ICD-10-CM | POA: Diagnosis present

## 2015-06-30 DIAGNOSIS — E1121 Type 2 diabetes mellitus with diabetic nephropathy: Secondary | ICD-10-CM | POA: Diagnosis present

## 2015-06-30 DIAGNOSIS — M810 Age-related osteoporosis without current pathological fracture: Secondary | ICD-10-CM | POA: Diagnosis present

## 2015-06-30 DIAGNOSIS — K219 Gastro-esophageal reflux disease without esophagitis: Secondary | ICD-10-CM | POA: Diagnosis present

## 2015-06-30 DIAGNOSIS — E876 Hypokalemia: Secondary | ICD-10-CM | POA: Diagnosis present

## 2015-06-30 DIAGNOSIS — N184 Chronic kidney disease, stage 4 (severe): Secondary | ICD-10-CM | POA: Diagnosis present

## 2015-06-30 DIAGNOSIS — E1122 Type 2 diabetes mellitus with diabetic chronic kidney disease: Secondary | ICD-10-CM | POA: Diagnosis present

## 2015-06-30 DIAGNOSIS — J45998 Other asthma: Secondary | ICD-10-CM | POA: Diagnosis present

## 2015-06-30 DIAGNOSIS — E785 Hyperlipidemia, unspecified: Secondary | ICD-10-CM | POA: Diagnosis present

## 2015-06-30 DIAGNOSIS — M179 Osteoarthritis of knee, unspecified: Secondary | ICD-10-CM | POA: Diagnosis present

## 2015-06-30 DIAGNOSIS — D649 Anemia, unspecified: Secondary | ICD-10-CM | POA: Diagnosis present

## 2015-06-30 LAB — CBC
HCT: 33.7 % — ABNORMAL LOW (ref 36.0–46.0)
Hemoglobin: 11.2 g/dL — ABNORMAL LOW (ref 12.0–15.0)
MCH: 29.7 pg (ref 26.0–34.0)
MCHC: 33.2 g/dL (ref 30.0–36.0)
MCV: 89.4 fL (ref 78.0–100.0)
Platelets: 249 10*3/uL (ref 150–400)
RBC: 3.77 MIL/uL — ABNORMAL LOW (ref 3.87–5.11)
RDW: 12.9 % (ref 11.5–15.5)
WBC: 9.2 10*3/uL (ref 4.0–10.5)

## 2015-06-30 LAB — I-STAT VENOUS BLOOD GAS, ED
Acid-Base Excess: 8 mmol/L — ABNORMAL HIGH (ref 0.0–2.0)
Bicarbonate: 30.3 mEq/L — ABNORMAL HIGH (ref 20.0–24.0)
O2 Saturation: 100 %
TCO2: 31 mmol/L (ref 0–100)
pCO2, Ven: 33.7 mmHg — ABNORMAL LOW (ref 45.0–50.0)
pH, Ven: 7.562 — ABNORMAL HIGH (ref 7.250–7.300)
pO2, Ven: 153 mmHg — ABNORMAL HIGH (ref 30.0–45.0)

## 2015-06-30 LAB — BASIC METABOLIC PANEL
Anion gap: 12 (ref 5–15)
BUN: 46 mg/dL — ABNORMAL HIGH (ref 6–20)
CO2: 26 mmol/L (ref 22–32)
Calcium: 9.4 mg/dL (ref 8.9–10.3)
Chloride: 97 mmol/L — ABNORMAL LOW (ref 101–111)
Creatinine, Ser: 3.23 mg/dL — ABNORMAL HIGH (ref 0.44–1.00)
GFR calc Af Amer: 16 mL/min — ABNORMAL LOW (ref >60)
GFR calc non Af Amer: 14 mL/min — ABNORMAL LOW (ref >60)
Glucose, Bld: 163 mg/dL — ABNORMAL HIGH (ref 65–99)
Potassium: 3.2 mmol/L — ABNORMAL LOW (ref 3.5–5.1)
Sodium: 135 mmol/L (ref 135–145)

## 2015-06-30 LAB — I-STAT TROPONIN, ED: Troponin i, poc: 0.07 ng/mL (ref 0.00–0.08)

## 2015-06-30 LAB — I-STAT CHEM 8, ED
BUN: 44 mg/dL — ABNORMAL HIGH (ref 6–20)
Calcium, Ion: 1.08 mmol/L — ABNORMAL LOW (ref 1.13–1.30)
Chloride: 97 mmol/L — ABNORMAL LOW (ref 101–111)
Creatinine, Ser: 3.2 mg/dL — ABNORMAL HIGH (ref 0.44–1.00)
Glucose, Bld: 158 mg/dL — ABNORMAL HIGH (ref 65–99)
HCT: 35 % — ABNORMAL LOW (ref 36.0–46.0)
Hemoglobin: 11.9 g/dL — ABNORMAL LOW (ref 12.0–15.0)
Potassium: 3.2 mmol/L — ABNORMAL LOW (ref 3.5–5.1)
Sodium: 137 mmol/L (ref 135–145)
TCO2: 28 mmol/L (ref 0–100)

## 2015-06-30 LAB — BRAIN NATRIURETIC PEPTIDE: B Natriuretic Peptide: 1532.5 pg/mL — ABNORMAL HIGH (ref 0.0–100.0)

## 2015-06-30 MED ORDER — LORATADINE 10 MG PO TABS
10.0000 mg | ORAL_TABLET | Freq: Every day | ORAL | Status: DC | PRN
  Administered 2015-07-01 – 2015-07-02 (×2): 10 mg via ORAL
  Filled 2015-06-30 (×2): qty 1

## 2015-06-30 MED ORDER — FERROUS SULFATE 325 (65 FE) MG PO TABS
325.0000 mg | ORAL_TABLET | Freq: Every day | ORAL | Status: DC
  Administered 2015-07-01 – 2015-07-03 (×3): 325 mg via ORAL
  Filled 2015-06-30 (×3): qty 1

## 2015-06-30 MED ORDER — ONDANSETRON HCL 4 MG PO TABS: 4.0000 mg | ORAL_TABLET | Freq: Three times a day (TID) | ORAL | Status: DC | PRN

## 2015-06-30 MED ORDER — TORSEMIDE 100 MG PO TABS: 100.0000 mg | ORAL_TABLET | Freq: Every day | ORAL | Status: DC

## 2015-06-30 MED ORDER — TORSEMIDE 100 MG PO TABS
100.0000 mg | ORAL_TABLET | Freq: Every day | ORAL | Status: DC
  Filled 2015-06-30: qty 1

## 2015-06-30 MED ORDER — FUROSEMIDE 10 MG/ML IJ SOLN
40.0000 mg | Freq: Once | INTRAMUSCULAR | Status: AC
  Administered 2015-06-30: 40 mg via INTRAVENOUS
  Filled 2015-06-30: qty 4

## 2015-06-30 MED ORDER — INSULIN ASPART 100 UNIT/ML ~~LOC~~ SOLN
0.0000 [IU] | Freq: Three times a day (TID) | SUBCUTANEOUS | Status: DC
  Administered 2015-07-01: 2 [IU] via SUBCUTANEOUS
  Administered 2015-07-01: 3 [IU] via SUBCUTANEOUS
  Administered 2015-07-02: 5 [IU] via SUBCUTANEOUS
  Administered 2015-07-02: 1 [IU] via SUBCUTANEOUS

## 2015-06-30 MED ORDER — NITROGLYCERIN 0.4 MG SL SUBL: 0.4000 mg | SUBLINGUAL_TABLET | SUBLINGUAL | Status: DC | PRN

## 2015-06-30 MED ORDER — ISOSORB DINITRATE-HYDRALAZINE 20-37.5 MG PO TABS
1.0000 | ORAL_TABLET | Freq: Two times a day (BID) | ORAL | Status: DC
  Administered 2015-07-01 – 2015-07-03 (×6): 1 via ORAL
  Filled 2015-06-30 (×6): qty 1

## 2015-06-30 MED ORDER — ATORVASTATIN CALCIUM 20 MG PO TABS
20.0000 mg | ORAL_TABLET | Freq: Every day | ORAL | Status: DC
  Administered 2015-07-01 – 2015-07-02 (×2): 20 mg via ORAL
  Filled 2015-06-30 (×2): qty 1

## 2015-06-30 MED ORDER — ONDANSETRON HCL 4 MG/2ML IJ SOLN
4.0000 mg | Freq: Once | INTRAMUSCULAR | Status: AC
  Administered 2015-06-30: 4 mg via INTRAVENOUS
  Filled 2015-06-30: qty 2

## 2015-06-30 MED ORDER — CARVEDILOL 25 MG PO TABS
25.0000 mg | ORAL_TABLET | Freq: Two times a day (BID) | ORAL | Status: DC
  Administered 2015-07-01 – 2015-07-03 (×5): 25 mg via ORAL
  Filled 2015-06-30 (×5): qty 1

## 2015-06-30 MED ORDER — METOLAZONE 2.5 MG PO TABS
2.5000 mg | ORAL_TABLET | ORAL | Status: DC
  Administered 2015-07-03: 2.5 mg via ORAL
  Filled 2015-06-30: qty 1

## 2015-06-30 MED ORDER — BUDESONIDE-FORMOTEROL FUMARATE 160-4.5 MCG/ACT IN AERO
1.0000 | INHALATION_SPRAY | Freq: Every evening | RESPIRATORY_TRACT | Status: DC
  Administered 2015-07-02: 1 via RESPIRATORY_TRACT
  Filled 2015-06-30 (×2): qty 6

## 2015-06-30 MED ORDER — ALBUTEROL SULFATE HFA 108 (90 BASE) MCG/ACT IN AERS: 1.0000 | INHALATION_SPRAY | Freq: Four times a day (QID) | RESPIRATORY_TRACT | Status: DC | PRN

## 2015-06-30 MED ORDER — RENA-VITE PO TABS
1.0000 | ORAL_TABLET | Freq: Every day | ORAL | Status: DC
  Administered 2015-07-01 – 2015-07-02 (×2): 1 via ORAL
  Filled 2015-06-30 (×2): qty 1

## 2015-06-30 MED ORDER — POTASSIUM CHLORIDE CRYS ER 20 MEQ PO TBCR
20.0000 meq | EXTENDED_RELEASE_TABLET | Freq: Once | ORAL | Status: AC
  Administered 2015-07-01: 20 meq via ORAL
  Filled 2015-06-30: qty 1

## 2015-06-30 MED ORDER — HEPARIN SODIUM (PORCINE) 5000 UNIT/ML IJ SOLN
5000.0000 [IU] | Freq: Three times a day (TID) | INTRAMUSCULAR | Status: DC
  Administered 2015-07-01 – 2015-07-03 (×7): 5000 [IU] via SUBCUTANEOUS
  Filled 2015-06-30 (×7): qty 1

## 2015-06-30 MED ORDER — POTASSIUM CHLORIDE CRYS ER 20 MEQ PO TBCR
20.0000 meq | EXTENDED_RELEASE_TABLET | Freq: Two times a day (BID) | ORAL | Status: DC
  Administered 2015-07-01 – 2015-07-03 (×6): 20 meq via ORAL
  Filled 2015-06-30 (×6): qty 1

## 2015-06-30 MED ORDER — INSULIN DETEMIR 100 UNIT/ML FLEXPEN: 10.0000 [IU] | PEN_INJECTOR | Freq: Every day | SUBCUTANEOUS | Status: DC

## 2015-06-30 MED ORDER — CLOTRIMAZOLE 1 % EX CREA
1.0000 "application " | TOPICAL_CREAM | Freq: Two times a day (BID) | CUTANEOUS | Status: DC
  Administered 2015-07-01 – 2015-07-03 (×4): 1 via TOPICAL
  Filled 2015-06-30 (×2): qty 15

## 2015-06-30 MED ORDER — ASPIRIN 81 MG PO CHEW
81.0000 mg | CHEWABLE_TABLET | Freq: Every day | ORAL | Status: DC
  Administered 2015-07-01 – 2015-07-03 (×3): 81 mg via ORAL
  Filled 2015-06-30 (×3): qty 1

## 2015-06-30 MED ORDER — NITROGLYCERIN IN D5W 200-5 MCG/ML-% IV SOLN
10.0000 ug/min | INTRAVENOUS | Status: DC
  Administered 2015-06-30: 100 ug/min via INTRAVENOUS
  Filled 2015-06-30: qty 250

## 2015-06-30 MED ORDER — PANTOPRAZOLE SODIUM 40 MG PO TBEC
40.0000 mg | DELAYED_RELEASE_TABLET | Freq: Every day | ORAL | Status: DC
  Administered 2015-07-01 – 2015-07-03 (×3): 40 mg via ORAL
  Filled 2015-06-30 (×3): qty 1

## 2015-06-30 MED ORDER — CALCITRIOL 0.25 MCG PO CAPS
0.2500 ug | ORAL_CAPSULE | Freq: Every day | ORAL | Status: DC
  Administered 2015-07-01 – 2015-07-03 (×3): 0.25 ug via ORAL
  Filled 2015-06-30 (×3): qty 1

## 2015-06-30 NOTE — ED Provider Notes (Signed)
CSN: [id number]     Arrival date & time 06/30/15  2009 History   First MD Initiated Contact with Patient 06/30/15 2025     Chief Complaint  Patient presents with  . Shortness of Breath  . Abdominal Pain  . Generalized Body Aches  . Nausea     (Consider location/radiation/quality/duration/timing/severity/associated sxs/prior Treatment) Patient is a 64 y.o. female presenting with shortness of breath. The history is provided by the patient and the spouse.  Shortness of Breath Severity:  Severe Onset quality:  Sudden Duration:  1 day Timing:  Constant Progression:  Worsening Chronicity:  Recurrent Context: URI   Relieved by:  Nothing Worsened by:  Nothing tried Ineffective treatments:  None tried Associated symptoms: cough   Associated symptoms: no chest pain, no fever, no headaches, no vomiting and no wheezing    64 yo F with a chief complaint shortness of breath. The started today. Patient has a history of CHF and thinks this is recurrence of that. Patient denies any increased weight gain denies any increased swelling. Patient has had a upper respiratory infection for the past week or so. Patient was recently seen by her nephrologist and suggested that she was on too much blood pressure medications so most of them were stopped. Patient is continuing to take her Lasix as directed.  Past Medical History  Diagnosis Date  . Hypertension   . Diverticulitis   . Hyperlipidemia   . Asthma   . Diabetes mellitus     insulin dependent  . Arthritis     knee  . GI bleed 03/31/2013  . CHF (congestive heart failure) ([place])   . Osteoporosis   . Seasonal allergies   . Anxiety   . Abdominal bruit   . Chronic kidney disease     stage 3  . Sleep apnea     can't afford cpap  . Pneumonia   . GERD (gastroesophageal reflux disease)     from medications  . Complication of anesthesia     " after I got home from my last procedure, I started itching."  . Anemia    Past Surgical History   Procedure Laterality Date  . Abdominal hysterectomy  1993`  . Esophagogastroduodenoscopy N/A 03/31/2013    Procedure: ESOPHAGOGASTRODUODENOSCOPY (EGD);  Surgeon: Gatha Mayer, MD;  Location: [facility] ENDOSCOPY;  Service: Endoscopy;  Laterality: N/A;  . Eye surgery      laser surgery  . Bascilic vein transposition Left 07/10/2014    Procedure: BASCILIC VEIN TRANSPOSITION;  Surgeon: Angelia Mould, MD;  Location: [place];  Service: Vascular;  Laterality: Left;  . Fistulogram Left 10/29/2014    Procedure: FISTULOGRAM;  Surgeon: Angelia Mould, MD;  Location: [facility] CATH LAB;  Service: Cardiovascular;  Laterality: Left;  . Bascilic vein transposition Right 11/08/2014    Procedure: FIRST STAGE BASILIC VEIN TRANSPOSITION;  Surgeon: Angelia Mould, MD;  Location: [place];  Service: Vascular;  Laterality: Right;  . Bascilic vein transposition Right 01/18/2015    Procedure: SECOND STAGE BASILIC VEIN TRANSPOSITION;  Surgeon: Angelia Mould, MD;  Location: [facility] OR;  Service: Vascular;  Laterality: Right;  . Patch angioplasty Right 01/18/2015    Procedure: BASILIC VEIN PATCH ANGIOPLASTY USING VASCUGUARD PATCH;  Surgeon: Angelia Mould, MD;  Location: [facility] OR;  Service: Vascular;  Laterality: Right;   Family History  Problem Relation Age of Onset  . Colon cancer Neg Hx   . Other Mother     not sure of cause of death  .  Diabetes Father   . Pancreatic cancer Maternal Grandmother    Social History  Substance Use Topics  . Smoking status: Former Smoker -- 0.35 packs/day for 40 years    Types: Cigarettes    Quit date: 05/10/2012  . Smokeless tobacco: Never Used  . Alcohol Use: No   OB History    No data available     Review of Systems  Constitutional: Negative for fever and chills.  HENT: Positive for congestion. Negative for rhinorrhea.   Eyes: Negative for redness and visual disturbance.  Respiratory: Positive for cough and shortness of breath. Negative for wheezing.    Cardiovascular: Negative for chest pain and palpitations.  Gastrointestinal: Negative for nausea and vomiting.  Genitourinary: Negative for dysuria and urgency.  Musculoskeletal: Negative for myalgias and arthralgias.  Skin: Negative for pallor and wound.  Neurological: Negative for dizziness and headaches.      Allergies  Lisinopril and Prednisone  Home Medications   Prior to Admission medications   Medication Sig Start Date End Date Taking? Authorizing Provider  albuterol (PROVENTIL HFA;VENTOLIN HFA) 108 (90 BASE) MCG/ACT inhaler Inhale 1-2 puffs into the lungs every 6 (six) hours as needed for wheezing or shortness of breath.   Yes Historical Provider, MD  aspirin 81 MG chewable tablet Chew 81 mg by mouth daily.   Yes Historical Provider, MD  atorvastatin (LIPITOR) 20 MG tablet Take 20 mg by mouth daily at 6 PM.   Yes Historical Provider, MD  budesonide-formoterol (SYMBICORT) 160-4.5 MCG/ACT inhaler Inhale 1 puff into the lungs every evening.   Yes Historical Provider, MD  calcitRIOL (ROCALTROL) 0.25 MCG capsule Take 0.25 mcg by mouth daily.   Yes Historical Provider, MD  carvedilol (COREG) 12.5 MG tablet Take 1 tablet (12.5 mg total) by mouth 2 (two) times daily with a meal. 06/14/15  Yes Thurnell Lose, MD  clotrimazole (LOTRIMIN) 1 % cream Apply 1 application topically 2 (two) times daily. 06/11/15  Yes Max T Hyatt, DPM  ferrous sulfate 325 (65 FE) MG tablet Take 325 mg by mouth daily with breakfast.   Yes Historical Provider, MD  insulin aspart (NOVOLOG) 100 UNIT/ML injection Inject 0-8 Units into the skin 3 (three) times daily before meals. Sliding scale   Yes Historical Provider, MD  isosorbide-hydrALAZINE (BIDIL) 20-37.5 MG tablet Take 2 tablets by mouth 2 (two) times daily. Patient taking differently: Take 1 tablet by mouth 2 (two) times daily.  06/14/15  Yes Thurnell Lose, MD  LEVEMIR FLEXTOUCH 100 UNIT/ML Pen Inject 10 Units into the muscle at bedtime.  01/16/15  Yes  Historical Provider, MD  loratadine (CLARITIN) 10 MG tablet Take 10 mg by mouth daily as needed for allergies.   Yes Historical Provider, MD  metolazone (ZAROXOLYN) 2.5 MG tablet Take 1 tablet (2.5 mg total) by mouth every 3 (three) days. Patient taking differently: Take 2.5 mg by mouth See admin instructions.  06/17/15  Yes Thurnell Lose, MD  multivitamin (RENA-VIT) TABS tablet Take 1 tablet by mouth daily. 01/24/15  Yes Historical Provider, MD  omeprazole (PRILOSEC) 20 MG capsule Take 20 mg by mouth daily.   Yes Historical Provider, MD  ondansetron (ZOFRAN) 4 MG tablet Take 1 tablet (4 mg total) by mouth every 8 (eight) hours as needed for nausea or vomiting. 06/14/15  Yes Thurnell Lose, MD  potassium chloride SA (K-DUR,KLOR-CON) 20 MEQ tablet Take 1 tablet (20 mEq total) by mouth 2 (two) times daily. 06/17/15  Yes Thurnell Lose, MD  torsemide (  DEMADEX) 100 MG tablet Take 1 tablet (100 mg total) by mouth daily. 06/17/15  Yes Thurnell Lose, MD   BP 170/83 mmHg  Pulse 86  Temp(Src) 97.5 F (36.4 C) (Oral)  Resp 14  Ht 5\' 5"  (1.651 m)  Wt 150 lb (68.04 kg)  BMI 24.96 kg/m2  SpO2 97% Physical Exam  Constitutional: She is oriented to person, place, and time. She appears well-developed and well-nourished. No distress.  HENT:  Head: Normocephalic and atraumatic.  Eyes: EOM are normal. Pupils are equal, round, and reactive to light.  Neck: Normal range of motion. Neck supple.  Cardiovascular: Normal rate and regular rhythm.  Exam reveals no gallop and no friction rub.   No murmur heard. Pulmonary/Chest: Effort normal. She has no wheezes. She has no rales.  Abdominal: Soft. She exhibits no distension. There is no tenderness. There is no rebound and no guarding.  Musculoskeletal: She exhibits no edema or tenderness.  Neurological: She is alert and oriented to person, place, and time.  Skin: Skin is warm and dry. She is not diaphoretic.  Psychiatric: She has a normal mood and  affect. Her behavior is normal.  Nursing note and vitals reviewed.   ED Course  Procedures (including critical care time) Labs Review Labs Reviewed  BASIC METABOLIC PANEL - Abnormal; Notable for the following:    Potassium 3.2 (*)    Chloride 97 (*)    Glucose, Bld 163 (*)    BUN 46 (*)    Creatinine, Ser 3.23 (*)    GFR calc non Af Amer 14 (*)    GFR calc Af Amer 16 (*)    All other components within normal limits  CBC - Abnormal; Notable for the following:    RBC 3.77 (*)    Hemoglobin 11.2 (*)    HCT 33.7 (*)    All other components within normal limits  BRAIN NATRIURETIC PEPTIDE - Abnormal; Notable for the following:    B Natriuretic Peptide 1532.5 (*)    All other components within normal limits  I-STAT CHEM 8, ED - Abnormal; Notable for the following:    Potassium 3.2 (*)    Chloride 97 (*)    BUN 44 (*)    Creatinine, Ser 3.20 (*)    Glucose, Bld 158 (*)    Calcium, Ion 1.08 (*)    Hemoglobin 11.9 (*)    HCT 35.0 (*)    All other components within normal limits  I-STAT VENOUS BLOOD GAS, ED - Abnormal; Notable for the following:    pH, Ven 7.562 (*)    pCO2, Ven 33.7 (*)    pO2, Ven 153.0 (*)    Bicarbonate 30.3 (*)    Acid-Base Excess 8.0 (*)    All other components within normal limits  BLOOD GAS, VENOUS  I-STAT TROPOININ, ED    Imaging Review Dg Chest Portable 1 View  06/30/2015  CLINICAL DATA:  Shortness of breath. Productive cough. Diffuse myalgias. Epigastric abdominal pain and nausea for the past week. EXAM: PORTABLE CHEST 1 VIEW COMPARISON:  06/18/2015. FINDINGS: Mildly progressive enlargement of the cardiac silhouette and prominence of the pulmonary vasculature and interstitial markings. Mild peribronchial thickening with progression. Probable tiny right pleural effusion. Thoracic spine degenerative changes. IMPRESSION: Interval changes of acute congestive heart failure. Electronically Signed   By: Claudie Revering M.D.   On: 06/30/2015 20:59   I have  personally reviewed and evaluated these images and lab results as part of my medical decision-making.  EKG Interpretation   Date/Time:  Sunday June 30 2015 20:12:06 EST Ventricular Rate:  107 PR Interval:  156 QRS Duration: 88 QT Interval:  374 QTC Calculation: 499 R Axis:   38 Text Interpretation:  Sinus tachycardia Septal infarct , age undetermined  T wave abnormality, consider lateral ischemia Abnormal ECG t waves with  increased voltage in I and aVL Confirmed by Bhavik Cabiness MD, Quillian Quince [id number]) on  06/30/2015 8:27:49 PM      Procedure note: Ultrasound Guided Peripheral IV Ultrasound guided peripheral 1.88 inch angiocath IV placement performed by me. Indications: Nursing unable to place IV. Details: The antecubital fossa and upper arm were evaluated with a multifrequency linear probe. Patent brachial veins were noted. 1 attempt was made to cannulate a vein under realtime US guidance with successful cannulation of the vein and catheter placement. There is return of non-pulsatile dark red blood. The patient tolerated the procedure well without complications. Images archived electronically.  CPT codes: [phone] and [phone]  CRITICAL CARE Performed by: Cecilio Asper   Total critical care time: 65 minutes  Critical care time was exclusive of separately billable procedures and treating other patients.  Critical care was necessary to treat or prevent imminent or life-threatening deterioration.  Critical care was time spent personally by me on the following activities: development of treatment plan with patient and/or surrogate as well as nursing, discussions with consultants, evaluation of patient's response to treatment, examination of patient, obtaining history from patient or surrogate, ordering and performing treatments and interventions, ordering and review of laboratory studies, ordering and review of radiographic studies, pulse oximetry and re-evaluation of patient's  condition.   MDM   Final diagnoses:  Acute pulmonary edema ([place])    64 yo F with a chief complaint of shortness of breath. The started abruptly today. Patient has a history of CHF and has had multiple admissions for the same. On my exam patient history and physical seem typical of acute pulmonary edema. Will place patient on BiPAP. Started on a nitro drip.  Patient was on a nitro drip at 100 mcg/m while I was in the room. Stated the room for approximately 20 minutes for the patient's blood pressure was titrated down to 150/90. The nitro drip was then halfed.  Patient feeling much better. Discussed the case with the patient's cardiologist, Dr. Einar Gip.  As patient has a history of chronic kidney disease he feels that should be better served as a medicine admit.  The patients results and plan were reviewed and discussed.   Any x-rays performed were independently reviewed by myself.   Differential diagnosis were considered with the presenting HPI.  Medications  nitroGLYCERIN (NITROSTAT) SL tablet 0.4 mg (not administered)  nitroGLYCERIN 50 mg in dextrose 5 % 250 mL (0.2 mg/mL) infusion (10 mcg/min Intravenous Rate/Dose Change 06/30/15 2213)  albuterol (PROVENTIL HFA;VENTOLIN HFA) 108 (90 BASE) MCG/ACT inhaler 1-2 puff (not administered)  aspirin chewable tablet 81 mg (not administered)  atorvastatin (LIPITOR) tablet 20 mg (not administered)  budesonide-formoterol (SYMBICORT) 160-4.5 MCG/ACT inhaler 1 puff (not administered)  calcitRIOL (ROCALTROL) capsule 0.25 mcg (not administered)  carvedilol (COREG) tablet 25 mg (not administered)  potassium chloride SA (K-DUR,KLOR-CON) CR tablet 20 mEq (not administered)  isosorbide-hydrALAZINE (BIDIL) 20-37.5 MG per tablet 1 tablet (not administered)  metolazone (ZAROXOLYN) tablet 2.5 mg (not administered)  ondansetron (ZOFRAN) tablet 4 mg (not administered)  multivitamin (RENA-VIT) tablet 1 tablet (not administered)  pantoprazole (PROTONIX) EC tablet  40 mg (not administered)  loratadine (CLARITIN) tablet 10 mg (  not administered)  Insulin Detemir (LEVEMIR) FlexPen 10 Units (not administered)  ferrous sulfate tablet 325 mg (not administered)  clotrimazole (LOTRIMIN) 1 % cream 1 application (not administered)  furosemide (LASIX) injection 40 mg (40 mg Intravenous Given 06/30/15 2129)  ondansetron (ZOFRAN) injection 4 mg (4 mg Intravenous Given 06/30/15 2155)    Filed Vitals:   06/30/15 2200 06/30/15 2205 06/30/15 2210 06/30/15 2230  BP: 130/102 142/99 138/77 170/83  Pulse: 93 87 85 86  Temp:      TempSrc:      Resp: 26 26 20 14   Height:      Weight:      SpO2: 100% 99% 98% 97%    Final diagnoses:  Acute pulmonary edema (HCC)    Admission/ observation were discussed with the admitting physician, patient and/or family and they are comfortable with the plan.     Deno Etienne, DO 06/30/15 2258

## 2015-06-30 NOTE — ED Notes (Addendum)
Pt. reports SOB with productive cough onset this evening , generalized body aches , epigastric pain and nausea this week . History of CHF and kidney disease her cardiologist is Dr. Filbert Schilder . Hypertensive at triage .

## 2015-06-30 NOTE — Progress Notes (Signed)
Placed patient on BiPAP due to increased and labored WOB. Patient is tolerating it well and RT will continue to follow.

## 2015-06-30 NOTE — H&P (Signed)
[facility] History and Physical  Destiny Day [id number] DOB: 12-Jan-1951 DOA: 06/30/2015  Referring physician: EDP PCP: Velna Hatchet, MD   Chief Complaint: SOB   HPI: Destiny Day is a 64 y.o. female with h/o CKD stage 4, malignant HTN.  Patient presents to the ED with 1 day history of worsening SOB that is severe.  Not responding to taking doses of torsemide and zaroxolyn this morning when she found out her weight was up 5 lbs.  A week or so ago she saw her nephrologist in the office.  At that time she was having difficulty with hypotension.  Dr. Lorrene Reid reportedly per patient took the patient off of norvasc and clonadine, halved her dose of metoprolol, reduced BIDIL to BID dosing from TID dosing, and told her not to take torsemide unless her weight was up (like it was today).  She also takes zaroxolyn every 3rd day or when her weight is up.  BP on arrival to ED today is 0000000 systolic, patient has pulmonary edema on CXR, weight is up to 150 on our scale (she says baseline is 138 for her at home).  Reports increasing abdominal girth which is where she typically gets her peripheral edema.  Review of Systems: Systems reviewed.  As above, otherwise negative  Past Medical History  Diagnosis Date  . Hypertension   . Diverticulitis   . Hyperlipidemia   . Asthma   . Diabetes mellitus     insulin dependent  . Arthritis     knee  . GI bleed 03/31/2013  . CHF (congestive heart failure) ([place])   . Osteoporosis   . Seasonal allergies   . Anxiety   . Abdominal bruit   . Chronic kidney disease     stage 3  . Sleep apnea     can't afford cpap  . Pneumonia   . GERD (gastroesophageal reflux disease)     from medications  . Complication of anesthesia     " after I got home from my last procedure, I started itching."  . Anemia    Past Surgical History  Procedure Laterality Date  . Abdominal hysterectomy  1993`  . Esophagogastroduodenoscopy N/A 03/31/2013   Procedure: ESOPHAGOGASTRODUODENOSCOPY (EGD);  Surgeon: Gatha Mayer, MD;  Location: [facility] ENDOSCOPY;  Service: Endoscopy;  Laterality: N/A;  . Eye surgery      laser surgery  . Bascilic vein transposition Left 07/10/2014    Procedure: BASCILIC VEIN TRANSPOSITION;  Surgeon: Angelia Mould, MD;  Location: [facility];  Service: Vascular;  Laterality: Left;  . Fistulogram Left 10/29/2014    Procedure: FISTULOGRAM;  Surgeon: Angelia Mould, MD;  Location: [facility] CATH LAB;  Service: Cardiovascular;  Laterality: Left;  . Bascilic vein transposition Right 11/08/2014    Procedure: FIRST STAGE BASILIC VEIN TRANSPOSITION;  Surgeon: Angelia Mould, MD;  Location: [employer];  Service: Vascular;  Laterality: Right;  . Bascilic vein transposition Right 01/18/2015    Procedure: SECOND STAGE BASILIC VEIN TRANSPOSITION;  Surgeon: Angelia Mould, MD;  Location: [place];  Service: Vascular;  Laterality: Right;  . Patch angioplasty Right 01/18/2015    Procedure: BASILIC VEIN PATCH ANGIOPLASTY USING VASCUGUARD PATCH;  Surgeon: Angelia Mould, MD;  Location: [place];  Service: Vascular;  Laterality: Right;   Social History:  reports that she quit smoking about 3 years ago. Her smoking use included Cigarettes. She has a 14 pack-year smoking history. She has never used smokeless tobacco. She reports that she uses illicit  drugs. She reports that she does not drink alcohol.  Allergies  Allergen Reactions  . Lisinopril Cough  . Prednisone Other (See Comments)    Excessive fluid buildup    Family History  Problem Relation Age of Onset  . Colon cancer Neg Hx   . Other Mother     not sure of cause of death  . Diabetes Father   . Pancreatic cancer Maternal Grandmother      Prior to Admission medications   Medication Sig Start Date End Date Taking? Authorizing Provider  albuterol (PROVENTIL HFA;VENTOLIN HFA) 108 (90 BASE) MCG/ACT inhaler Inhale 1-2 puffs into the lungs every 6 (six) hours as needed for  wheezing or shortness of breath.   Yes Historical Provider, MD  aspirin 81 MG chewable tablet Chew 81 mg by mouth daily.   Yes Historical Provider, MD  atorvastatin (LIPITOR) 20 MG tablet Take 20 mg by mouth daily at 6 PM.   Yes Historical Provider, MD  budesonide-formoterol (SYMBICORT) 160-4.5 MCG/ACT inhaler Inhale 1 puff into the lungs every evening.   Yes Historical Provider, MD  calcitRIOL (ROCALTROL) 0.25 MCG capsule Take 0.25 mcg by mouth daily.   Yes Historical Provider, MD  carvedilol (COREG) 12.5 MG tablet Take 1 tablet (12.5 mg total) by mouth 2 (two) times daily with a meal. 06/14/15  Yes Thurnell Lose, MD  clotrimazole (LOTRIMIN) 1 % cream Apply 1 application topically 2 (two) times daily. 06/11/15  Yes Max T Hyatt, DPM  ferrous sulfate 325 (65 FE) MG tablet Take 325 mg by mouth daily with breakfast.   Yes Historical Provider, MD  insulin aspart (NOVOLOG) 100 UNIT/ML injection Inject 0-8 Units into the skin 3 (three) times daily before meals. Sliding scale   Yes Historical Provider, MD  isosorbide-hydrALAZINE (BIDIL) 20-37.5 MG tablet Take 2 tablets by mouth 2 (two) times daily. Patient taking differently: Take 1 tablet by mouth 2 (two) times daily.  06/14/15  Yes Thurnell Lose, MD  LEVEMIR FLEXTOUCH 100 UNIT/ML Pen Inject 10 Units into the muscle at bedtime.  01/16/15  Yes Historical Provider, MD  loratadine (CLARITIN) 10 MG tablet Take 10 mg by mouth daily as needed for allergies.   Yes Historical Provider, MD  metolazone (ZAROXOLYN) 2.5 MG tablet Take 1 tablet (2.5 mg total) by mouth every 3 (three) days. Patient taking differently: Take 2.5 mg by mouth See admin instructions.  06/17/15  Yes Thurnell Lose, MD  multivitamin (RENA-VIT) TABS tablet Take 1 tablet by mouth daily. 01/24/15  Yes Historical Provider, MD  omeprazole (PRILOSEC) 20 MG capsule Take 20 mg by mouth daily.   Yes Historical Provider, MD  ondansetron (ZOFRAN) 4 MG tablet Take 1 tablet (4 mg total) by mouth  every 8 (eight) hours as needed for nausea or vomiting. 06/14/15  Yes Thurnell Lose, MD  potassium chloride SA (K-DUR,KLOR-CON) 20 MEQ tablet Take 1 tablet (20 mEq total) by mouth 2 (two) times daily. 06/17/15  Yes Thurnell Lose, MD  torsemide (DEMADEX) 100 MG tablet Take 1 tablet (100 mg total) by mouth daily. 06/17/15  Yes Thurnell Lose, MD   Physical Exam: Filed Vitals:   06/30/15 2210 06/30/15 2230  BP: 138/77 170/83  Pulse: 85 86  Temp:    Resp: 20 14    BP 170/83 mmHg  Pulse 86  Temp(Src) 97.5 F (36.4 C) (Oral)  Resp 14  Ht 5\' 5"  (1.651 m)  Wt 68.04 kg (150 lb)  BMI 24.96 kg/m2  SpO2 97%  General Appearance:    Alert, oriented, no distress, appears stated age  Head:    Normocephalic, atraumatic  Eyes:    PERRL, EOMI, sclera non-icteric        Nose:   Nares without drainage or epistaxis. Mucosa, turbinates normal  Throat:   Moist mucous membranes. Oropharynx without erythema or exudate.  Neck:   Supple. No carotid bruits.  No thyromegaly.  No lymphadenopathy.   Back:     No CVA tenderness, no spinal tenderness  Lungs:     Clear to auscultation bilaterally, without wheezes, rhonchi or rales  Chest wall:    No tenderness to palpitation  Heart:    Regular rate and rhythm without murmurs, gallops, rubs  Abdomen:     Soft, non-tender, nondistended, normal bowel sounds, no organomegaly  Genitalia:    deferred  Rectal:    deferred  Extremities:   No clubbing, cyanosis or edema.  Pulses:   2+ and symmetric all extremities  Skin:   Skin color, texture, turgor normal, no rashes or lesions  Lymph nodes:   Cervical, supraclavicular, and axillary nodes normal  Neurologic:   CNII-XII intact. Normal strength, sensation and reflexes      throughout    Labs on Admission:  Basic Metabolic Panel:  Recent Labs Lab 06/30/15 2042 06/30/15 2050  NA 135 137  K 3.2* 3.2*  CL 97* 97*  CO2 26  --   GLUCOSE 163* 158*  BUN 46* 44*  CREATININE 3.23* 3.20*  CALCIUM 9.4  --     Liver Function Tests: No results for input(s): AST, ALT, ALKPHOS, BILITOT, PROT, ALBUMIN in the last 168 hours. No results for input(s): LIPASE, AMYLASE in the last 168 hours. No results for input(s): AMMONIA in the last 168 hours. CBC:  Recent Labs Lab 06/30/15 2042 06/30/15 2050  WBC 9.2  --   HGB 11.2* 11.9*  HCT 33.7* 35.0*  MCV 89.4  --   PLT 249  --    Cardiac Enzymes: No results for input(s): CKTOTAL, CKMB, CKMBINDEX, TROPONINI in the last 168 hours.  BNP (last 3 results) No results for input(s): PROBNP in the last 8760 hours. CBG: No results for input(s): GLUCAP in the last 168 hours.  Radiological Exams on Admission: Dg Chest Portable 1 View  06/30/2015  CLINICAL DATA:  Shortness of breath. Productive cough. Diffuse myalgias. Epigastric abdominal pain and nausea for the past week. EXAM: PORTABLE CHEST 1 VIEW COMPARISON:  06/18/2015. FINDINGS: Mildly progressive enlargement of the cardiac silhouette and prominence of the pulmonary vasculature and interstitial markings. Mild peribronchial thickening with progression. Probable tiny right pleural effusion. Thoracic spine degenerative changes. IMPRESSION: Interval changes of acute congestive heart failure. Electronically Signed   By: Claudie Revering M.D.   On: 06/30/2015 20:59    EKG: Independently reviewed.  Assessment/Plan Principal Problem:   Hypertensive urgency Active Problems:   Diastolic CHF, acute on chronic (HCC)   HTN (hypertension), malignant   Type 2 diabetes mellitus with diabetic nephropathy (HCC)   CKD (chronic kidney disease) stage 4, GFR 15-29 ml/min ([place])   1. Hypertensive urgency - CHF exacerbation is improved after patients BP is somewhat controlled with NTG gtt 1. Resume prior (higher) dose of metoprolol 2. Continue home BIDIL 3. Resuming torsemide daily for the moment 4. Will leave her off of the clonidine and Norvasc for the moment 5. Likely will want to discuss during day with Dr. Lorrene Reid who  is managing her BP meds 6. Got lasix 40mg  IV in  ED x1, took her torsemide and zaroxolyn earlier today 7. Titrate patient off of NTG gtt as able 1. Add back Norvasc as next medication if BP still running high 2. DM2 - 1. continue home levemir 10 2. SSI ac/hs sensitive scale 3. CKD 4 - 1. Dr. Lorrene Reid is patients nephrologist 2. BMP in AM to monitor    Code Status: Full  Family Communication: Husband at bedside Disposition Plan: Admit to inpatient   Time spent: 70 min  Ebelyn Bohnet M. [facility] Pager [phone]  If 7AM-7PM, please contact the day team taking care of the patient [email or web address] Password [email or web address] 06/30/2015, 11:10 PM

## 2015-07-01 DIAGNOSIS — Z794 Long term (current) use of insulin: Secondary | ICD-10-CM

## 2015-07-01 DIAGNOSIS — E1121 Type 2 diabetes mellitus with diabetic nephropathy: Secondary | ICD-10-CM

## 2015-07-01 DIAGNOSIS — N184 Chronic kidney disease, stage 4 (severe): Secondary | ICD-10-CM

## 2015-07-01 DIAGNOSIS — I10 Essential (primary) hypertension: Secondary | ICD-10-CM

## 2015-07-01 DIAGNOSIS — I5033 Acute on chronic diastolic (congestive) heart failure: Secondary | ICD-10-CM

## 2015-07-01 LAB — BASIC METABOLIC PANEL
Anion gap: 11 (ref 5–15)
BUN: 48 mg/dL — ABNORMAL HIGH (ref 6–20)
CO2: 28 mmol/L (ref 22–32)
Calcium: 8.7 mg/dL — ABNORMAL LOW (ref 8.9–10.3)
Chloride: 95 mmol/L — ABNORMAL LOW (ref 101–111)
Creatinine, Ser: 3.6 mg/dL — ABNORMAL HIGH (ref 0.44–1.00)
GFR calc Af Amer: 14 mL/min — ABNORMAL LOW (ref >60)
GFR calc non Af Amer: 12 mL/min — ABNORMAL LOW (ref >60)
Glucose, Bld: 295 mg/dL — ABNORMAL HIGH (ref 65–99)
Potassium: 3.5 mmol/L (ref 3.5–5.1)
Sodium: 134 mmol/L — ABNORMAL LOW (ref 135–145)

## 2015-07-01 LAB — GLUCOSE, CAPILLARY
Glucose-Capillary: 189 mg/dL — ABNORMAL HIGH (ref 65–99)
Glucose-Capillary: 182 mg/dL — ABNORMAL HIGH (ref 65–99)
Glucose-Capillary: 121 mg/dL — ABNORMAL HIGH (ref 65–99)
Glucose-Capillary: 146 mg/dL — ABNORMAL HIGH (ref 65–99)

## 2015-07-01 MED ORDER — MUPIROCIN 2 % EX OINT
1.0000 "application " | TOPICAL_OINTMENT | Freq: Two times a day (BID) | CUTANEOUS | Status: DC
  Administered 2015-07-01 – 2015-07-03 (×5): 1 via NASAL
  Filled 2015-07-01 (×2): qty 22

## 2015-07-01 MED ORDER — HYDROCOD POLST-CPM POLST ER 10-8 MG/5ML PO SUER
5.0000 mL | Freq: Two times a day (BID) | ORAL | Status: DC | PRN
  Administered 2015-07-01: 5 mL via ORAL
  Filled 2015-07-01: qty 5

## 2015-07-01 MED ORDER — ALBUTEROL SULFATE (2.5 MG/3ML) 0.083% IN NEBU
2.5000 mg | INHALATION_SOLUTION | Freq: Four times a day (QID) | RESPIRATORY_TRACT | Status: DC | PRN
  Administered 2015-07-02: 2.5 mg via RESPIRATORY_TRACT
  Filled 2015-07-01: qty 3

## 2015-07-01 MED ORDER — INSULIN DETEMIR 100 UNIT/ML ~~LOC~~ SOLN
10.0000 [IU] | Freq: Every day | SUBCUTANEOUS | Status: DC
Start: 2015-07-01 — End: 2015-07-03
  Administered 2015-07-01 – 2015-07-02 (×3): 10 [IU] via SUBCUTANEOUS
  Filled 2015-07-01 (×4): qty 0.1

## 2015-07-01 MED ORDER — FUROSEMIDE 80 MG PO TABS
80.0000 mg | ORAL_TABLET | Freq: Two times a day (BID) | ORAL | Status: DC
  Administered 2015-07-01: 80 mg via ORAL
  Filled 2015-07-01: qty 1

## 2015-07-01 MED ORDER — HYDRALAZINE HCL 20 MG/ML IJ SOLN
10.0000 mg | Freq: Four times a day (QID) | INTRAMUSCULAR | Status: DC | PRN
  Administered 2015-07-02 – 2015-07-03 (×2): 10 mg via INTRAVENOUS
  Filled 2015-07-01 (×2): qty 1

## 2015-07-01 MED ORDER — CHLORHEXIDINE GLUCONATE CLOTH 2 % EX PADS
6.0000 | MEDICATED_PAD | Freq: Every day | CUTANEOUS | Status: DC
  Administered 2015-07-01 – 2015-07-03 (×3): 6 via TOPICAL

## 2015-07-01 MED ORDER — FUROSEMIDE 10 MG/ML IJ SOLN
80.0000 mg | Freq: Two times a day (BID) | INTRAMUSCULAR | Status: DC
  Administered 2015-07-01 – 2015-07-02 (×3): 80 mg via INTRAVENOUS
  Filled 2015-07-01 (×3): qty 8

## 2015-07-01 NOTE — Progress Notes (Addendum)
PATIENT DETAILS Name: Destiny Day Age: 64 y.o. Sex: female Date of Birth: [birth year] Admit Date: 06/30/2015 Admitting Physician Etta Quill, DO KX:[id number], Nicki Reaper, MD  Subjective: Feels better-significantly less shortness of breath.  Assessment/Plan: Principal Problem: Acute hypoxic respiratory failure: Secondary to flash pulmonary edema from uncontrolled hypertension. Required BiPAP on admission, now much better with IV Lasix/nitroglycerin-transitionwd to nasal cannula.Follow  Active Problems: Acute diastolic heart failure (EF 60-65% on TTE 06/14/15): Likely precipitated by uncontrolled hypertension. Continue Lasix, claims dry weight is 138 pounds. Follow clinical course.  Hypertensive emergency: Required IV nitroglycerin on admission. Apparently as an outpatient had episodes of hypotension requiring adjustments of antihypertensives/diuretics. For now continue with Coreg, BiDil, Lasix-will continue to follow and adjust accordingly. Amlodipine and clonidine remain on hold  Addendum 11 AM: Spoke with Dr. Suzan Garibaldi has had numerous renal artery duplex and workup for secondary hypertension-therefore will not order any. Patient has an appointment with Dr. Lorrene Reid next week.  Chronic kidney disease stage IV: Creatinine close to her baseline. Has underlying diabetic nephropathy. Follow closely.  Type 2 diabetes: CBGs relatively stable-continue 10 units of Levemir and SSI.  GERD: Continue PPI  Hypokalemia: Continue potassium supplements he did follow  Dyslipidemia: Continue statin  History of bronchial asthma: Stable, continue as needed bronchodilators.  Disposition: Remain inpatient  Antimicrobial agents  See below  Anti-infectives    None      DVT Prophylaxis: Prophylactic Heparin  Code Status: Full code  Family Communication None at bedside  Procedures: None  CONSULTS:  None  Time spent 30 minutes-Greater than 50% of this  time was spent in counseling, explanation of diagnosis, planning of further management, and coordination of care.  MEDICATIONS: Scheduled Meds: . aspirin  81 mg Oral Daily  . atorvastatin  20 mg Oral q1800  . budesonide-formoterol  1 puff Inhalation QPM  . calcitRIOL  0.25 mcg Oral Daily  . carvedilol  25 mg Oral BID WC  . Chlorhexidine Gluconate Cloth  6 each Topical Q0600  . clotrimazole  1 application Topical BID  . ferrous sulfate  325 mg Oral Q breakfast  . furosemide  80 mg Oral BID  . heparin  5,000 Units Subcutaneous 3 times per day  . insulin aspart  0-9 Units Subcutaneous TID WC  . insulin detemir  10 Units Subcutaneous QHS  . isosorbide-hydrALAZINE  1 tablet Oral BID  . [START ON 07/03/2015] metolazone  2.5 mg Oral Q3 days  . multivitamin  1 tablet Oral Daily  . mupirocin ointment  1 application Nasal BID  . pantoprazole  40 mg Oral Daily  . potassium chloride SA  20 mEq Oral BID   Continuous Infusions:  PRN Meds:.albuterol, loratadine, nitroGLYCERIN, ondansetron    PHYSICAL EXAM: Vital signs in last 24 hours: Filed Vitals:   07/01/15 0056 07/01/15 0255 07/01/15 0453 07/01/15 0500  BP: 112/55 147/69 150/62   Pulse: 90  80   Temp: 98.1 F (36.7 C)   98 F (36.7 C)  TempSrc: Oral     Resp: 14     Height: 5\' 5"  (1.651 m)     Weight: 64.411 kg (142 lb)   64.184 kg (141 lb 8 oz)  SpO2: 98%  95%     Weight change:  Filed Weights   06/30/15 2021 07/01/15 0056 07/01/15 0500  Weight: 68.04 kg (150 lb) 64.411 kg (142 lb) 64.184 kg (141 lb 8 oz)   Body  mass index is 23.55 kg/(m^2).   Gen Exam: Awake and alert with clear speech.   Neck: Supple, No JVD.   Chest: B/L Clear.   CVS: S1 S2 Regular, no murmurs.  Abdomen: soft, BS +, non tender, non distended.  Extremities: no edema, lower extremities warm to touch. Neurologic: Non Focal.   Skin: No Rash.   Wounds: N/A.   Intake/Output from previous day:  Intake/Output Summary (Last 24 hours) at 07/01/15  1047 Last data filed at 07/01/15 1043  Gross per 24 hour  Intake    240 ml  Output   1100 ml  Net   -860 ml     LAB RESULTS: CBC  Recent Labs Lab 06/30/15 2042 06/30/15 2050  WBC 9.2  --   HGB 11.2* 11.9*  HCT 33.7* 35.0*  PLT 249  --   MCV 89.4  --   MCH 29.7  --   MCHC 33.2  --   RDW 12.9  --     Chemistries   Recent Labs Lab 06/30/15 2042 06/30/15 2050 07/01/15 0421  NA 135 137 134*  K 3.2* 3.2* 3.5  CL 97* 97* 95*  CO2 26  --  28  GLUCOSE 163* 158* 295*  BUN 46* 44* 48*  CREATININE 3.23* 3.20* 3.60*  CALCIUM 9.4  --  8.7*    CBG:  Recent Labs Lab 07/01/15 0059 07/01/15 0749  GLUCAP 189* 182*    GFR Estimated Creatinine Clearance: 14.2 mL/min (by C-G formula based on Cr of 3.6).  Coagulation profile No results for input(s): INR, PROTIME in the last 168 hours.  Cardiac Enzymes No results for input(s): CKMB, TROPONINI, MYOGLOBIN in the last 168 hours.  Invalid input(s): CK  Invalid input(s): POCBNP No results for input(s): DDIMER in the last 72 hours. No results for input(s): HGBA1C in the last 72 hours. No results for input(s): CHOL, HDL, LDLCALC, TRIG, CHOLHDL, LDLDIRECT in the last 72 hours. No results for input(s): TSH, T4TOTAL, T3FREE, THYROIDAB in the last 72 hours.  Invalid input(s): FREET3 No results for input(s): VITAMINB12, FOLATE, FERRITIN, TIBC, IRON, RETICCTPCT in the last 72 hours. No results for input(s): LIPASE, AMYLASE in the last 72 hours.  Urine Studies No results for input(s): UHGB, CRYS in the last 72 hours.  Invalid input(s): UACOL, UAPR, USPG, UPH, UTP, UGL, UKET, UBIL, UNIT, UROB, ULEU, UEPI, UWBC, URBC, UBAC, CAST, UCOM, BILUA  MICROBIOLOGY: No results found for this or any previous visit (from the past 240 hour(s)).  RADIOLOGY STUDIES/RESULTS: Dg Chest 2 View  06/18/2015  CLINICAL DATA:  Shortness of breath for 2 days. Previous smoker. Weakness, fatigue, dizziness, near-syncope. EXAM: CHEST  2 VIEW  COMPARISON:  06/12/2015 FINDINGS: Heart size and pulmonary vascularity are normal. Mild emphysematous changes in the lungs with central interstitial change and peribronchial thickening likely due to chronic bronchitis. Suggestion of mild bronchiectasis centrally. No focal airspace disease or consolidation in the lungs. No blunting of costophrenic angles. No pneumothorax. Calcified and tortuous aorta. Degenerative changes in the spine and shoulders. IMPRESSION: Emphysematous and chronic bronchitic changes in the lungs. No evidence of active pulmonary disease. Electronically Signed   By: Lucienne Capers M.D.   On: 06/18/2015 20:35   US Renal  06/13/2015  CLINICAL DATA:  Acute renal failure EXAM: RENAL / URINARY TRACT ULTRASOUND COMPLETE COMPARISON:  Renal ultrasound 02/16/2014 FINDINGS: Right Kidney: Length: 11.5 cm. Echogenicity within normal limits. No mass or hydronephrosis visualized. Left Kidney: Length: 11.8 cm. Echogenicity within normal limits. No mass or  hydronephrosis visualized. Bladder: Appears normal for degree of bladder distention. IMPRESSION: Negative renal ultrasound Electronically Signed   By: Franchot Gallo M.D.   On: 06/13/2015 15:19   Nm Pulmonary Perf And Vent  06/12/2015  CLINICAL DATA:  Chronic cough, with acute onset of left breast soreness and nausea. Initial encounter. EXAM: NUCLEAR MEDICINE VENTILATION - PERFUSION LUNG SCAN TECHNIQUE: Ventilation images were obtained in multiple projections using inhaled aerosol Tc-59m DTPA. Perfusion images were obtained in multiple projections after intravenous injection of Tc-54m MAA. RADIOPHARMACEUTICALS:  31.7 mCi Technetium-67m DTPA aerosol inhalation and 4.33 mCi Technetium-38m MAA IV COMPARISON:  Chest radiograph performed earlier today at 2:42 p.m. FINDINGS: Ventilation: No focal ventilation defect. Perfusion: No wedge shaped peripheral perfusion defects to suggest acute pulmonary embolism. IMPRESSION: Normal V/Q scan.  No evidence for  pulmonary embolus. Electronically Signed   By: Garald Balding M.D.   On: 06/12/2015 23:56   Dg Chest Portable 1 View  06/30/2015  CLINICAL DATA:  Shortness of breath. Productive cough. Diffuse myalgias. Epigastric abdominal pain and nausea for the past week. EXAM: PORTABLE CHEST 1 VIEW COMPARISON:  06/18/2015. FINDINGS: Mildly progressive enlargement of the cardiac silhouette and prominence of the pulmonary vasculature and interstitial markings. Mild peribronchial thickening with progression. Probable tiny right pleural effusion. Thoracic spine degenerative changes. IMPRESSION: Interval changes of acute congestive heart failure. Electronically Signed   By: Claudie Revering M.D.   On: 06/30/2015 20:59   Dg Chest Portable 1 View  06/12/2015  CLINICAL DATA:  Chest pain on the left for 2 days, cough EXAM: PORTABLE CHEST - 1 VIEW COMPARISON:  03/15/2015 FINDINGS: Cardiac shadow is within normal limits. The lungs are well aerated bilaterally. No focal infiltrate or sizable effusion is seen. Aortic calcifications are again noted. IMPRESSION: No acute abnormality noted. Electronically Signed   By: Inez Catalina M.D.   On: 06/12/2015 14:59    Oren Binet, MD  [facility] Pager:[phone]  If 7PM-7AM, please contact night-coverage [email or web address] Password [email or web address] 07/01/2015, 10:47 AM   LOS: 1 day

## 2015-07-01 NOTE — Progress Notes (Signed)
UR Completed Kiersten Coss Graves-Bigelow, RN,BSN [phone]  

## 2015-07-02 ENCOUNTER — Ambulatory Visit: Payer: BC Managed Care – PPO

## 2015-07-02 LAB — BASIC METABOLIC PANEL
Anion gap: 9 (ref 5–15)
BUN: 60 mg/dL — ABNORMAL HIGH (ref 6–20)
CO2: 27 mmol/L (ref 22–32)
Calcium: 9 mg/dL (ref 8.9–10.3)
Chloride: 99 mmol/L — ABNORMAL LOW (ref 101–111)
Creatinine, Ser: 3.95 mg/dL — ABNORMAL HIGH (ref 0.44–1.00)
GFR calc Af Amer: 13 mL/min — ABNORMAL LOW (ref >60)
GFR calc non Af Amer: 11 mL/min — ABNORMAL LOW (ref >60)
Glucose, Bld: 146 mg/dL — ABNORMAL HIGH (ref 65–99)
Potassium: 4.4 mmol/L (ref 3.5–5.1)
Sodium: 135 mmol/L (ref 135–145)

## 2015-07-02 LAB — GLUCOSE, CAPILLARY
Glucose-Capillary: 229 mg/dL — ABNORMAL HIGH (ref 65–99)
Glucose-Capillary: 131 mg/dL — ABNORMAL HIGH (ref 65–99)
Glucose-Capillary: 265 mg/dL — ABNORMAL HIGH (ref 65–99)
Glucose-Capillary: 142 mg/dL — ABNORMAL HIGH (ref 65–99)
Glucose-Capillary: 317 mg/dL — ABNORMAL HIGH (ref 65–99)

## 2015-07-02 MED ORDER — DIAZEPAM 2 MG PO TABS
1.0000 mg | ORAL_TABLET | Freq: Once | ORAL | Status: DC
  Filled 2015-07-02: qty 1

## 2015-07-02 MED ORDER — LORAZEPAM 1 MG PO TABS
1.0000 mg | ORAL_TABLET | Freq: Once | ORAL | Status: AC
  Administered 2015-07-02: 1 mg via ORAL
  Filled 2015-07-02: qty 1

## 2015-07-02 MED ORDER — AMLODIPINE BESYLATE 2.5 MG PO TABS
2.5000 mg | ORAL_TABLET | Freq: Every day | ORAL | Status: DC
Start: 2015-07-02 — End: 2015-07-03
  Administered 2015-07-02 – 2015-07-03 (×2): 2.5 mg via ORAL
  Filled 2015-07-02 (×3): qty 1

## 2015-07-02 NOTE — Progress Notes (Signed)
PATIENT DETAILS Name: Destiny Day Age: 64 y.o. Sex: female Date of Birth: 1950/11/07 Admit Date: 06/30/2015 Admitting Physician Etta Quill, DO [id number], Nicki Reaper, MD  Subjective: Mild shortness of breath on ambulation-but much better than on admission  Assessment/Plan: Principal Problem: Acute hypoxic respiratory failure: Secondary to flash pulmonary edema from uncontrolled hypertension. Required BiPAP on admission, now much better with IV Lasix/nitroglycerin-transitionwd to nasal cannula.Follow  Active Problems: Acute diastolic heart failure (EF 60-65% on TTE 06/14/15): Likely precipitated by uncontrolled hypertension. Continue Lasix, claims dry weight is 138 pounds. Follow clinical course.  Hypertensive emergency: Required IV nitroglycerin on admission. Apparently as an outpatient had episodes of hypotension requiring adjustments of antihypertensives/diuretics. Spoke with patient's primary nephrologist on 12/5-Dr. Bedelia Person has had extensive workup in the outpatient setting for secondary causes of hypertension including renal artery duplex. Blood pressure continues to fluctuate-it is particularly elevated this morning, continue Coreg, BiDil and Lasix, and low-dose amlodipine. Plan is to allow some mild permissive hypertension given fluctuations in blood pressure.   Chronic kidney disease stage IV: Creatinine close to her baseline. Has underlying diabetic nephropathy. Follow closely.  Type 2 diabetes: CBGs relatively stable-continue 10 units of Levemir and SSI.  GERD: Continue PPI  Hypokalemia: Continue potassium supplements he did follow  Dyslipidemia: Continue statin  History of bronchial asthma: Stable, continue as needed bronchodilators.  Disposition: Remain inpatient-ambulate today-home tomorrow if blood pressure better  Antimicrobial agents  See below  Anti-infectives    None      DVT Prophylaxis: Prophylactic Heparin  Code  Status: Full code  Family Communication None at bedside  Procedures: None  CONSULTS:  None  Time spent 30 minutes-Greater than 50% of this time was spent in counseling, explanation of diagnosis, planning of further management, and coordination of care.  MEDICATIONS: Scheduled Meds: . amLODipine  2.5 mg Oral Daily  . aspirin  81 mg Oral Daily  . atorvastatin  20 mg Oral q1800  . budesonide-formoterol  1 puff Inhalation QPM  . calcitRIOL  0.25 mcg Oral Daily  . carvedilol  25 mg Oral BID WC  . Chlorhexidine Gluconate Cloth  6 each Topical Q0600  . clotrimazole  1 application Topical BID  . ferrous sulfate  325 mg Oral Q breakfast  . furosemide  80 mg Intravenous BID  . heparin  5,000 Units Subcutaneous 3 times per day  . insulin aspart  0-9 Units Subcutaneous TID WC  . insulin detemir  10 Units Subcutaneous QHS  . isosorbide-hydrALAZINE  1 tablet Oral BID  . [START ON 07/03/2015] metolazone  2.5 mg Oral Q3 days  . multivitamin  1 tablet Oral Daily  . mupirocin ointment  1 application Nasal BID  . pantoprazole  40 mg Oral Daily  . potassium chloride SA  20 mEq Oral BID   Continuous Infusions:  PRN Meds:.albuterol, chlorpheniramine-HYDROcodone, hydrALAZINE, loratadine, nitroGLYCERIN, ondansetron    PHYSICAL EXAM: Vital signs in last 24 hours: Filed Vitals:   07/02/15 0115 07/02/15 0448 07/02/15 0528 07/02/15 0816  BP:  193/85 185/80   Pulse: 77 80    Temp:  97.9 F (36.6 C)  98.5 F (36.9 C)  TempSrc:  Oral  Oral  Resp: 22 18    Height:      Weight:  64.955 kg (143 lb 3.2 oz)    SpO2: 96% 97%      Weight change: -3.084 kg (-6 lb 12.8 oz) Filed Weights  07/01/15 0056 07/01/15 0500 07/02/15 0448  Weight: 64.411 kg (142 lb) 64.184 kg (141 lb 8 oz) 64.955 kg (143 lb 3.2 oz)   Body mass index is 23.83 kg/(m^2).   Gen Exam: Awake and alert with clear speech.   Neck: Supple, No JVD.   Chest: B/L Clear.   CVS: S1 S2 Regular, no murmurs.  Abdomen: soft, BS +,  non tender, non distended.  Extremities: no edema, lower extremities warm to touch. Neurologic: Non Focal.   Skin: No Rash.   Wounds: N/A.   Intake/Output from previous day:  Intake/Output Summary (Last 24 hours) at 07/02/15 1053 Last data filed at 07/02/15 1012  Gross per 24 hour  Intake   1362 ml  Output   2055 ml  Net   -693 ml     LAB RESULTS: CBC  Recent Labs Lab 06/30/15 2042 06/30/15 2050  WBC 9.2  --   HGB 11.2* 11.9*  HCT 33.7* 35.0*  PLT 249  --   MCV 89.4  --   MCH 29.7  --   MCHC 33.2  --   RDW 12.9  --     Chemistries   Recent Labs Lab 06/30/15 2042 06/30/15 2050 07/01/15 0421 07/02/15 0412  NA 135 137 134* 135  K 3.2* 3.2* 3.5 4.4  CL 97* 97* 95* 99*  CO2 26  --  28 27  GLUCOSE 163* 158* 295* 146*  BUN 46* 44* 48* 60*  CREATININE 3.23* 3.20* 3.60* 3.95*  CALCIUM 9.4  --  8.7* 9.0    CBG:  Recent Labs Lab 07/01/15 0749 07/01/15 1123 07/01/15 1632 07/01/15 2117 07/02/15 0722  GLUCAP 182* 121* 229* 146* 131*    GFR Estimated Creatinine Clearance: 12.9 mL/min (by C-G formula based on Cr of 3.95).  Coagulation profile No results for input(s): INR, PROTIME in the last 168 hours.  Cardiac Enzymes No results for input(s): CKMB, TROPONINI, MYOGLOBIN in the last 168 hours.  Invalid input(s): CK  Invalid input(s): POCBNP No results for input(s): DDIMER in the last 72 hours. No results for input(s): HGBA1C in the last 72 hours. No results for input(s): CHOL, HDL, LDLCALC, TRIG, CHOLHDL, LDLDIRECT in the last 72 hours. No results for input(s): TSH, T4TOTAL, T3FREE, THYROIDAB in the last 72 hours.  Invalid input(s): FREET3 No results for input(s): VITAMINB12, FOLATE, FERRITIN, TIBC, IRON, RETICCTPCT in the last 72 hours. No results for input(s): LIPASE, AMYLASE in the last 72 hours.  Urine Studies No results for input(s): UHGB, CRYS in the last 72 hours.  Invalid input(s): UACOL, UAPR, USPG, UPH, UTP, UGL, UKET, UBIL, UNIT, UROB,  ULEU, UEPI, UWBC, URBC, UBAC, CAST, UCOM, BILUA  MICROBIOLOGY: No results found for this or any previous visit (from the past 240 hour(s)).  RADIOLOGY STUDIES/RESULTS: Dg Chest 2 View  06/18/2015  CLINICAL DATA:  Shortness of breath for 2 days. Previous smoker. Weakness, fatigue, dizziness, near-syncope. EXAM: CHEST  2 VIEW COMPARISON:  06/12/2015 FINDINGS: Heart size and pulmonary vascularity are normal. Mild emphysematous changes in the lungs with central interstitial change and peribronchial thickening likely due to chronic bronchitis. Suggestion of mild bronchiectasis centrally. No focal airspace disease or consolidation in the lungs. No blunting of costophrenic angles. No pneumothorax. Calcified and tortuous aorta. Degenerative changes in the spine and shoulders. IMPRESSION: Emphysematous and chronic bronchitic changes in the lungs. No evidence of active pulmonary disease. Electronically Signed   By: Lucienne Capers M.D.   On: 06/18/2015 20:35   US Renal  06/13/2015  CLINICAL DATA:  Acute renal failure EXAM: RENAL / URINARY TRACT ULTRASOUND COMPLETE COMPARISON:  Renal ultrasound 02/16/2014 FINDINGS: Right Kidney: Length: 11.5 cm. Echogenicity within normal limits. No mass or hydronephrosis visualized. Left Kidney: Length: 11.8 cm. Echogenicity within normal limits. No mass or hydronephrosis visualized. Bladder: Appears normal for degree of bladder distention. IMPRESSION: Negative renal ultrasound Electronically Signed   By: Franchot Gallo M.D.   On: 06/13/2015 15:19   Nm Pulmonary Perf And Vent  06/12/2015  CLINICAL DATA:  Chronic cough, with acute onset of left breast soreness and nausea. Initial encounter. EXAM: NUCLEAR MEDICINE VENTILATION - PERFUSION LUNG SCAN TECHNIQUE: Ventilation images were obtained in multiple projections using inhaled aerosol Tc-17m DTPA. Perfusion images were obtained in multiple projections after intravenous injection of Tc-1m MAA. RADIOPHARMACEUTICALS:  31.7 mCi  Technetium-18m DTPA aerosol inhalation and 4.33 mCi Technetium-64m MAA IV COMPARISON:  Chest radiograph performed earlier today at 2:42 p.m. FINDINGS: Ventilation: No focal ventilation defect. Perfusion: No wedge shaped peripheral perfusion defects to suggest acute pulmonary embolism. IMPRESSION: Normal V/Q scan.  No evidence for pulmonary embolus. Electronically Signed   By: Garald Balding M.D.   On: 06/12/2015 23:56   Dg Chest Portable 1 View  06/30/2015  CLINICAL DATA:  Shortness of breath. Productive cough. Diffuse myalgias. Epigastric abdominal pain and nausea for the past week. EXAM: PORTABLE CHEST 1 VIEW COMPARISON:  06/18/2015. FINDINGS: Mildly progressive enlargement of the cardiac silhouette and prominence of the pulmonary vasculature and interstitial markings. Mild peribronchial thickening with progression. Probable tiny right pleural effusion. Thoracic spine degenerative changes. IMPRESSION: Interval changes of acute congestive heart failure. Electronically Signed   By: Claudie Revering M.D.   On: 06/30/2015 20:59   Dg Chest Portable 1 View  06/12/2015  CLINICAL DATA:  Chest pain on the left for 2 days, cough EXAM: PORTABLE CHEST - 1 VIEW COMPARISON:  03/15/2015 FINDINGS: Cardiac shadow is within normal limits. The lungs are well aerated bilaterally. No focal infiltrate or sizable effusion is seen. Aortic calcifications are again noted. IMPRESSION: No acute abnormality noted. Electronically Signed   By: Inez Catalina M.D.   On: 06/12/2015 14:59    Oren Binet, MD  [facility] Pager:[phone]  If 7PM-7AM, please contact night-coverage [email or web address] Password [email or web address] 07/02/2015, 10:53 AM   LOS: 2 days

## 2015-07-02 NOTE — Progress Notes (Signed)
Pt resting comfortable with O2 sats of 98% and other vital signs within normal range. Pt not in distress, BiPAP not needed at this time. RT will continue to monitor as needed

## 2015-07-02 NOTE — Evaluation (Signed)
Physical Therapy Evaluation Patient Details Name: Destiny Day MRN: [id number] DOB: Aug 18, 1950 Today's Date: 07/02/2015   History of Present Illness  Pt is a 64 y/o admitted w/ acute hypoxic respiratory failure due to flash pulmonary edema from uncontrolled hypertension.  Pt's PMH includes CHF, osteoporosis, anxiety, anemia.  Clinical Impression  Pt admitted with above diagnosis. Pt currently with functional limitations due to the deficits listed below (see PT Problem List). No instability noted but Destiny Day demonstrates a very guarded and dec gait speed and was provided cues to relax shoulders.  Pt will benefit from skilled PT to increase their independence and safety with mobility to allow discharge to the venue listed below.      Follow Up Recommendations Home health PT;Supervision - Intermittent    Equipment Recommendations  None recommended by PT    Recommendations for Other Services       Precautions / Restrictions Precautions Precautions: Fall Restrictions Weight Bearing Restrictions: No      Mobility  Bed Mobility               General bed mobility comments: Pt sitting EOB at start and end of session  Transfers Overall transfer level: Needs assistance Equipment used: Rolling walker (2 wheeled) Transfers: Sit to/from Stand Sit to Stand: Supervision         General transfer comment: Cues for hand placement and proper technique using RW.  Pt slow to sit and stand.  Ambulation/Gait Ambulation/Gait assistance: Supervision Ambulation Distance (Feet): 100 Feet Assistive device: Rolling walker (2 wheeled) Gait Pattern/deviations: Step-through pattern;Decreased stride length;Antalgic;Trunk flexed   Gait velocity interpretation: <1.8 ft/sec, indicative of risk for recurrent falls General Gait Details: Pt WB through Bil UEs and shrugging shoulders, cues to relax shoulders and look ahead.  Very dec gait speed which pt reports is her baseline.    Stairs             Wheelchair Mobility    Modified Rankin (Stroke Patients Only)       Balance Overall balance assessment: Needs assistance;History of Falls (fall ~2 wks ago when attempting to change vents at home) Sitting-balance support: Feet supported Sitting balance-Leahy Scale: Good     Standing balance support: Bilateral upper extremity supported;During functional activity Standing balance-Leahy Scale: Poor Standing balance comment: relies on RW for support                             Pertinent Vitals/Pain Pain Assessment: 0-10 Pain Score: 4  Pain Location: "bottom" Pain Descriptors / Indicators: Aching;Sore Pain Intervention(s): Limited activity within patient's tolerance;Monitored during session;Repositioned;Other (comment) (RN notified)    Home Living Family/patient expects to be discharged to:: Private residence Living Arrangements: Spouse/significant other Available Help at Discharge: Family;Available PRN/intermittently Type of Home: House Home Access: Stairs to enter Entrance Stairs-Rails: Left Entrance Stairs-Number of Steps: 6 Home Layout: Two level;Bed/bath upstairs Home Equipment: Walker - 4 wheels;Cane - single point Agricultural consultant)      Prior Function Level of Independence: Independent with assistive device(s)         Comments: Cane inside home, rollator out in community so she can sit when she fatigues     Hand Dominance   Dominant Hand: Right    Extremity/Trunk Assessment   Upper Extremity Assessment: Generalized weakness           Lower Extremity Assessment: RLE deficits/detail;LLE deficits/detail RLE Deficits / Details: grossly 3/5 LLE Deficits / Details: grossly 3/5  Communication   Communication: No difficulties  Cognition Arousal/Alertness: Awake/alert Behavior During Therapy: WFL for tasks assessed/performed Overall Cognitive Status: Within Functional Limits for tasks assessed                      General  Comments General comments (skin integrity, edema, etc.): Pt reports she has ambulated slowly every since she was diagnosed w/ CHF, no other reason noted.    Exercises General Exercises - Lower Extremity Ankle Circles/Pumps: AROM;Both;10 reps;Seated Long Arc Quad: AROM;Both;10 reps;Seated      Assessment/Plan    PT Assessment Patient needs continued PT services  PT Diagnosis Difficulty walking;Generalized weakness   PT Problem List Decreased strength;Decreased range of motion;Decreased activity tolerance;Decreased balance;Decreased mobility;Decreased knowledge of use of DME;Decreased safety awareness;Decreased knowledge of precautions;Cardiopulmonary status limiting activity;Pain  PT Treatment Interventions DME instruction;Gait training;Stair training;Functional mobility training;Therapeutic activities;Therapeutic exercise;Balance training;Neuromuscular re-education;Patient/family education   PT Goals (Current goals can be found in the Care Plan section) Acute Rehab PT Goals Patient Stated Goal: to go home w/ husband PT Goal Formulation: With patient/family Time For Goal Achievement: 07/16/15 Potential to Achieve Goals: Good    Frequency Min 3X/week   Barriers to discharge Inaccessible home environment stairs at home    Co-evaluation               End of Session Equipment Utilized During Treatment: Gait belt Activity Tolerance: Patient limited by fatigue Patient left: in bed;with call bell/phone within reach;with bed alarm set;with family/visitor present;Other (comment) (sitting EOB eating lunch) Nurse Communication: Mobility status;Other (comment) (pt's report of pain on her "buttocks")         Time: [id number] PT Time Calculation (min) (ACUTE ONLY): 28 min   Charges:   PT Evaluation $Initial PT Evaluation Tier I: 1 Procedure PT Treatments $Gait Training: 8-22 mins   PT G CodesJoslyn Hy PT, DPT [phone] Pager: [phone] 07/02/2015, 1:30 PM

## 2015-07-03 LAB — BASIC METABOLIC PANEL
Anion gap: 13 (ref 5–15)
BUN: 62 mg/dL — ABNORMAL HIGH (ref 6–20)
CO2: 27 mmol/L (ref 22–32)
Calcium: 9.2 mg/dL (ref 8.9–10.3)
Chloride: 93 mmol/L — ABNORMAL LOW (ref 101–111)
Creatinine, Ser: 4.08 mg/dL — ABNORMAL HIGH (ref 0.44–1.00)
GFR calc Af Amer: 12 mL/min — ABNORMAL LOW (ref >60)
GFR calc non Af Amer: 11 mL/min — ABNORMAL LOW (ref >60)
Glucose, Bld: 190 mg/dL — ABNORMAL HIGH (ref 65–99)
Potassium: 3.6 mmol/L (ref 3.5–5.1)
Sodium: 133 mmol/L — ABNORMAL LOW (ref 135–145)

## 2015-07-03 LAB — GLUCOSE, CAPILLARY: Glucose-Capillary: 109 mg/dL — ABNORMAL HIGH (ref 65–99)

## 2015-07-03 MED ORDER — TORSEMIDE 20 MG PO TABS
100.0000 mg | ORAL_TABLET | Freq: Every day | ORAL | Status: DC
  Administered 2015-07-03: 100 mg via ORAL
  Filled 2015-07-03: qty 5

## 2015-07-03 MED ORDER — ISOSORB DINITRATE-HYDRALAZINE 20-37.5 MG PO TABS: 1.0000 | ORAL_TABLET | Freq: Two times a day (BID) | ORAL | Status: DC

## 2015-07-03 MED ORDER — AMLODIPINE BESYLATE 2.5 MG PO TABS: 2.5000 mg | ORAL_TABLET | Freq: Every day | ORAL | Status: DC

## 2015-07-03 MED ORDER — HYDROCOD POLST-CPM POLST ER 10-8 MG/5ML PO SUER: 5.0000 mL | Freq: Two times a day (BID) | ORAL | Status: DC | PRN

## 2015-07-03 MED ORDER — CARVEDILOL 12.5 MG PO TABS: 25.0000 mg | ORAL_TABLET | Freq: Two times a day (BID) | ORAL | Status: DC

## 2015-07-03 MED ORDER — METOLAZONE 2.5 MG PO TABS: 2.5000 mg | ORAL_TABLET | ORAL | Status: DC

## 2015-07-03 NOTE — Progress Notes (Signed)
Inpatient Diabetes Program Recommendations  AACE/ADA: New Consensus Statement on Inpatient Glycemic Control (2015)  Target Ranges:  Prepandial:   less than 140 mg/dL      Peak postprandial:   less than 180 mg/dL (1-2 hours)      Critically ill patients:  140 - 180 mg/dL   Review of Glycemic Control  Diabetes history: DM 2 Outpatient Diabetes medications: Levemir 10, Novolog 0-8 TID Current orders for Inpatient glycemic control: Levemir 10 QHS, Novolog Sensitive TID  Inpatient Diabetes Program Recommendations: Correction (SSI): Glucose was 317 mg/dl at bedtime. Please consider ordering Novolog HS scale. Insulin - Meal Coverage: Glucose increased around meal times into the 200-300 range. Please consider ordering Novolog 3 units TID meal coverage.  Thanks,  Tama Headings RN, MSN, [facility] Inpatient Diabetes Coordinator Team Pager [phone] (8a-5p)

## 2015-07-03 NOTE — Care Management Note (Signed)
Case Management Note  Patient Details  Name: Destiny Day MRN: [id number] Date of Birth: 1951-07-09  Subjective/Objective: Pt admitted for hypertensive urgency. Pt is from home with husband and the plan is to return home.                   Action/Plan: CM did discuss [place] PT services with pt. Pt unable to utilize PT at this time due to co pay amounts being too expensive @ 125.00 a visit. Pt has DME RW and cane. Husband states he is in and out during the day. CM did stress the importance of safety while ambulating. No further needs from CM at this time.    Expected Discharge Date:                  Expected Discharge Plan:  Home/Self Care  In-House Referral:  NA  Discharge planning Services  CM Consult  Post Acute Care Choice:  NA Choice offered to:  NA  DME Arranged:  N/A DME Agency:  NA  HH Arranged:  NA HH Agency:  NA  Status of Service:  Completed, signed off  Medicare Important Message Given:    Date Medicare IM Given:    Medicare IM give by:    Date Additional Medicare IM Given:    Additional Medicare Important Message give by:     If discussed at [place] of Stay Meetings, dates discussed:    Additional Comments:  Bethena Roys, RN 07/03/2015, 9:50 AM

## 2015-07-03 NOTE — Discharge Summary (Signed)
PATIENT DETAILS Name: Destiny Day Age: 64 y.o. Sex: female Date of Birth: May 22, 1951 MRN: [id number]. Admitting Physician: Etta Quill, DO [id number], Nicki Reaper, MD  Admit Date: 06/30/2015 Discharge date: 07/03/2015  Recommendations for Outpatient Follow-up:  1. Allow some permissive hypertension (blood pressure difficult to control her-has had episodes of symptomatic hypotension in the recent past)  2. Ensure follow-up with nephrology.  3. Please repeat CBC/BMET at next visit  PRIMARY DISCHARGE DIAGNOSIS:  Principal Problem:   Hypertensive urgency Active Problems:   Diastolic CHF, acute on chronic (HCC)   HTN (hypertension), malignant   Type 2 diabetes mellitus with diabetic nephropathy (HCC)   CKD (chronic kidney disease) stage 4, GFR 15-29 ml/min (HCC)      PAST MEDICAL HISTORY: Past Medical History  Diagnosis Date  . Hypertension   . Diverticulitis   . Hyperlipidemia   . Asthma   . Diabetes mellitus     insulin dependent  . Arthritis     knee  . GI bleed 03/31/2013  . CHF (congestive heart failure) ([place])   . Osteoporosis   . Seasonal allergies   . Anxiety   . Abdominal bruit   . Chronic kidney disease     stage 3  . Sleep apnea     can't afford cpap  . Pneumonia   . GERD (gastroesophageal reflux disease)     from medications  . Complication of anesthesia     " after I got home from my last procedure, I started itching."  . Anemia     DISCHARGE MEDICATIONS: Discharge Medication List as of 07/03/2015  9:31 AM    START taking these medications   Details  amLODipine (NORVASC) 2.5 MG tablet Take 1 tablet (2.5 mg total) by mouth daily., Starting 07/03/2015, Until Discontinued, Print      CONTINUE these medications which have CHANGED   Details  carvedilol (COREG) 12.5 MG tablet Take 2 tablets (25 mg total) by mouth 2 (two) times daily with a meal., Starting 07/03/2015, Until Discontinued, Print    isosorbide-hydrALAZINE (BIDIL) 20-37.5 MG tablet  Take 1 tablet by mouth 2 (two) times daily., Starting 07/03/2015, Until Discontinued, Print    metolazone (ZAROXOLYN) 2.5 MG tablet Take 1 tablet (2.5 mg total) by mouth every 3 (three) days., Starting 07/03/2015, Until Discontinued, No Print      CONTINUE these medications which have NOT CHANGED   Details  albuterol (PROVENTIL HFA;VENTOLIN HFA) 108 (90 BASE) MCG/ACT inhaler Inhale 1-2 puffs into the lungs every 6 (six) hours as needed for wheezing or shortness of breath., Until Discontinued, Historical Med    aspirin 81 MG chewable tablet Chew 81 mg by mouth daily., Until Discontinued, Historical Med    atorvastatin (LIPITOR) 20 MG tablet Take 20 mg by mouth daily at 6 PM., Until Discontinued, Historical Med    budesonide-formoterol (SYMBICORT) 160-4.5 MCG/ACT inhaler Inhale 1 puff into the lungs every evening., Until Discontinued, Historical Med    calcitRIOL (ROCALTROL) 0.25 MCG capsule Take 0.25 mcg by mouth daily., Until Discontinued, Historical Med    clotrimazole (LOTRIMIN) 1 % cream Apply 1 application topically 2 (two) times daily., Starting 06/11/2015, Until Discontinued, Normal    ferrous sulfate 325 (65 FE) MG tablet Take 325 mg by mouth daily with breakfast., Until Discontinued, Historical Med    insulin aspart (NOVOLOG) 100 UNIT/ML injection Inject 0-8 Units into the skin 3 (three) times daily before meals. Sliding scale, Until Discontinued, Historical Med    LEVEMIR FLEXTOUCH 100 UNIT/ML Pen Inject  10 Units into the muscle at bedtime. , Starting 01/16/2015, Until Discontinued, Historical Med    loratadine (CLARITIN) 10 MG tablet Take 10 mg by mouth daily as needed for allergies., Until Discontinued, Historical Med    multivitamin (RENA-VIT) TABS tablet Take 1 tablet by mouth daily., Starting 01/24/2015, Until Discontinued, Historical Med    omeprazole (PRILOSEC) 20 MG capsule Take 20 mg by mouth daily., Until Discontinued, Historical Med    ondansetron (ZOFRAN) 4 MG tablet  Take 1 tablet (4 mg total) by mouth every 8 (eight) hours as needed for nausea or vomiting., Starting 06/14/2015, Until Discontinued, Normal    potassium chloride SA (K-DUR,KLOR-CON) 20 MEQ tablet Take 1 tablet (20 mEq total) by mouth 2 (two) times daily., Starting 06/17/2015, Until Discontinued, Print    torsemide (DEMADEX) 100 MG tablet Take 1 tablet (100 mg total) by mouth daily., Starting 06/17/2015, Until Discontinued, Normal        ALLERGIES:   Allergies  Allergen Reactions  . Lisinopril Cough  . Prednisone Other (See Comments)    Excessive fluid buildup    BRIEF HPI:  See H&P, Labs, Consult and Test reports for all details in brief, patient was admitted for evaluation of flash pulmonary edema.  CONSULTATIONS:   None  PERTINENT RADIOLOGIC STUDIES: Dg Chest 2 View  06/18/2015  CLINICAL DATA:  Shortness of breath for 2 days. Previous smoker. Weakness, fatigue, dizziness, near-syncope. EXAM: CHEST  2 VIEW COMPARISON:  06/12/2015 FINDINGS: Heart size and pulmonary vascularity are normal. Mild emphysematous changes in the lungs with central interstitial change and peribronchial thickening likely due to chronic bronchitis. Suggestion of mild bronchiectasis centrally. No focal airspace disease or consolidation in the lungs. No blunting of costophrenic angles. No pneumothorax. Calcified and tortuous aorta. Degenerative changes in the spine and shoulders. IMPRESSION: Emphysematous and chronic bronchitic changes in the lungs. No evidence of active pulmonary disease. Electronically Signed   By: Lucienne Capers M.D.   On: 06/18/2015 20:35   US Renal  06/13/2015  CLINICAL DATA:  Acute renal failure EXAM: RENAL / URINARY TRACT ULTRASOUND COMPLETE COMPARISON:  Renal ultrasound 02/16/2014 FINDINGS: Right Kidney: Length: 11.5 cm. Echogenicity within normal limits. No mass or hydronephrosis visualized. Left Kidney: Length: 11.8 cm. Echogenicity within normal limits. No mass or hydronephrosis  visualized. Bladder: Appears normal for degree of bladder distention. IMPRESSION: Negative renal ultrasound Electronically Signed   By: Franchot Gallo M.D.   On: 06/13/2015 15:19   Nm Pulmonary Perf And Vent  06/12/2015  CLINICAL DATA:  Chronic cough, with acute onset of left breast soreness and nausea. Initial encounter. EXAM: NUCLEAR MEDICINE VENTILATION - PERFUSION LUNG SCAN TECHNIQUE: Ventilation images were obtained in multiple projections using inhaled aerosol Tc-18m DTPA. Perfusion images were obtained in multiple projections after intravenous injection of Tc-61m MAA. RADIOPHARMACEUTICALS:  31.7 mCi Technetium-76m DTPA aerosol inhalation and 4.33 mCi Technetium-55m MAA IV COMPARISON:  Chest radiograph performed earlier today at 2:42 p.m. FINDINGS: Ventilation: No focal ventilation defect. Perfusion: No wedge shaped peripheral perfusion defects to suggest acute pulmonary embolism. IMPRESSION: Normal V/Q scan.  No evidence for pulmonary embolus. Electronically Signed   By: Garald Balding M.D.   On: 06/12/2015 23:56   Dg Chest Portable 1 View  06/30/2015  CLINICAL DATA:  Shortness of breath. Productive cough. Diffuse myalgias. Epigastric abdominal pain and nausea for the past week. EXAM: PORTABLE CHEST 1 VIEW COMPARISON:  06/18/2015. FINDINGS: Mildly progressive enlargement of the cardiac silhouette and prominence of the pulmonary vasculature and interstitial markings. Mild peribronchial  thickening with progression. Probable tiny right pleural effusion. Thoracic spine degenerative changes. IMPRESSION: Interval changes of acute congestive heart failure. Electronically Signed   By: Claudie Revering M.D.   On: 06/30/2015 20:59   Dg Chest Portable 1 View  06/12/2015  CLINICAL DATA:  Chest pain on the left for 2 days, cough EXAM: PORTABLE CHEST - 1 VIEW COMPARISON:  03/15/2015 FINDINGS: Cardiac shadow is within normal limits. The lungs are well aerated bilaterally. No focal infiltrate or sizable effusion is  seen. Aortic calcifications are again noted. IMPRESSION: No acute abnormality noted. Electronically Signed   By: Inez Catalina M.D.   On: 06/12/2015 14:59     PERTINENT LAB RESULTS: CBC:  Recent Labs  06/30/15 2042 06/30/15 2050  WBC 9.2  --   HGB 11.2* 11.9*  HCT 33.7* 35.0*  PLT 249  --    CMET CMP     Component Value Date/Time   NA 133* 07/03/2015 0353   K 3.6 07/03/2015 0353   CL 93* 07/03/2015 0353   CO2 27 07/03/2015 0353   GLUCOSE 190* 07/03/2015 0353   BUN 62* 07/03/2015 0353   CREATININE 4.08* 07/03/2015 0353   CALCIUM 9.2 07/03/2015 0353   PROT 6.8 06/13/2015 0840   ALBUMIN 2.8* 06/13/2015 0840   AST 21 06/13/2015 0840   ALT 17 06/13/2015 0840   ALKPHOS 103 06/13/2015 0840   BILITOT 0.7 06/13/2015 0840   GFRNONAA 11* 07/03/2015 0353   GFRAA 12* 07/03/2015 0353    GFR Estimated Creatinine Clearance: 12.5 mL/min (by C-G formula based on Cr of 4.08). No results for input(s): LIPASE, AMYLASE in the last 72 hours. No results for input(s): CKTOTAL, CKMB, CKMBINDEX, TROPONINI in the last 72 hours. Invalid input(s): POCBNP No results for input(s): DDIMER in the last 72 hours. No results for input(s): HGBA1C in the last 72 hours. No results for input(s): CHOL, HDL, LDLCALC, TRIG, CHOLHDL, LDLDIRECT in the last 72 hours. No results for input(s): TSH, T4TOTAL, T3FREE, THYROIDAB in the last 72 hours.  Invalid input(s): FREET3 No results for input(s): VITAMINB12, FOLATE, FERRITIN, TIBC, IRON, RETICCTPCT in the last 72 hours. Coags: No results for input(s): INR in the last 72 hours.  Invalid input(s): PT Microbiology: No results found for this or any previous visit (from the past 240 hour(s)).   BRIEF HOSPITAL COURSE:  Acute hypoxic respiratory failure: Resolved. Secondary to flash pulmonary edema from uncontrolled hypertension. Required BiPAP on admission, now much better with IV Lasix/nitroglycerin-transitioned to nasal cannula. On room air at the time of  discharge.  Active Problems: Acute diastolic heart failure (EF 60-65% on TTE 06/14/15): Likely precipitated by uncontrolled hypertension. Treated with intravenous Lasix, transition to Demadex on discharge. Os per weight is unreliable, completely compensated at the time of discharge.   Hypertensive emergency: Required IV nitroglycerin on admission. Apparently as an outpatient had episodes of hypotension requiring adjustments of antihypertensives/diuretics. Spoke with patient's primary nephrologist on 12/5-Dr. Bedelia Person has had extensive workup in the outpatient setting for secondary causes of hypertension including renal artery duplex. Blood pressure continues to fluctuate-it is particularly elevated this morning, continue Coreg, BiDil and Lasix, and low-dose amlodipine. Plan is to allow some mild permissive hypertension given fluctuations in blood pressure.   Chronic kidney disease stage IV: Creatinine close to her baseline. Has underlying diabetic nephropathy. Follow closely.  Type 2 diabetes: CBGs relatively stable-continue 10 units of Levemir and SSI.  GERD: Continue PPI  Hypokalemia: Continue potassium supplements he did follow  Dyslipidemia: Continue statin  History  of bronchial asthma: Stable, continue as needed bronchodilators.  TODAY-DAY OF DISCHARGE:  Subjective:   Destiny Day today has no headache,no chest abdominal pain,no new weakness tingling or numbness, feels much better wants to go home today.   Objective:   Blood pressure 147/56, pulse 80, temperature 97.8 F (36.6 C), temperature source Oral, resp. rate 15, height 5\' 5"  (1.651 m), weight 65.318 kg (144 lb), SpO2 95 %.  Intake/Output Summary (Last 24 hours) at 07/03/15 1146 Last data filed at 07/03/15 0859  Gross per 24 hour  Intake   1200 ml  Output    900 ml  Net    300 ml   Filed Weights   07/02/15 0448 07/02/15 1455 07/03/15 0526  Weight: 64.955 kg (143 lb 3.2 oz) 100.925 kg (222 lb 8 oz) 65.318 kg  (144 lb)    Exam Awake Alert, Oriented *3, No new F.N deficits, Normal affect [place].AT,PERRAL Supple Neck,No JVD, No cervical lymphadenopathy appriciated.  Symmetrical Chest wall movement, Good air movement bilaterally, CTAB RRR,No Gallops,Rubs or new Murmurs, No Parasternal Heave +ve B.Sounds, Abd Soft, Non tender, No organomegaly appriciated, No rebound -guarding or rigidity. No Cyanosis, Clubbing or edema, No new Rash or bruise  DISCHARGE CONDITION: Stable  DISPOSITION: Home with home health services  DISCHARGE INSTRUCTIONS:    Activity:  As tolerated with Full fall precautions use walker/cane & assistance as needed  Get Medicines reviewed and adjusted: Please take all your medications with you for your next visit with your Primary MD  Please request your Primary MD to go over all hospital tests and procedure/radiological results at the follow up, please ask your Primary MD to get all Hospital records sent to his/her office.  If you experience worsening of your admission symptoms, develop shortness of breath, life threatening emergency, suicidal or homicidal thoughts you must seek medical attention immediately by calling 911 or calling your MD immediately  if symptoms less severe.  You must read complete instructions/literature along with all the possible adverse reactions/side effects for all the Medicines you take and that have been prescribed to you. Take any new Medicines after you have completely understood and accpet all the possible adverse reactions/side effects.   Do not drive when taking Pain medications.   Do not take more than prescribed Pain, Sleep and Anxiety Medications  Special Instructions: If you have smoked or chewed Tobacco  in the last 2 yrs please stop smoking, stop any regular Alcohol  and or any Recreational drug use.  Wear Seat belts while driving.  Please note  You were cared for by a hospitalist during your hospital stay. Once you are discharged,  your primary care physician will handle any further medical issues. Please note that NO REFILLS for any discharge medications will be authorized once you are discharged, as it is imperative that you return to your primary care physician (or establish a relationship with a primary care physician if you do not have one) for your aftercare needs so that they can reassess your need for medications and monitor your lab values.   Diet recommendation: Diabetic Diet Heart Healthy diet  Discharge Instructions    (HEART FAILURE PATIENTS) Call MD:  Anytime you have any of the following symptoms: 1) 3 pound weight gain in 24 hours or 5 pounds in 1 week 2) shortness of breath, with or without a dry hacking cough 3) swelling in the hands, feet or stomach 4) if you have to sleep on extra pillows at night in order to  breathe.    Complete by:  As directed      Call MD for:  persistant dizziness or light-headedness    Complete by:  As directed      Call MD for:  persistant nausea and vomiting    Complete by:  As directed      Diet - low sodium heart healthy    Complete by:  As directed      Increase activity slowly    Complete by:  As directed            Follow-up Information    Follow up with Velna Hatchet, MD. Schedule an appointment as soon as possible for a visit in 1 week.   Specialty:  Internal Medicine   Contact information:   [place] [address] [phone]       Follow up with Lucrezia Starch, MD.   Specialty:  Nephrology   Why:  keep existing appointment   Contact information:   [place] [id number] [phone]       Total Time spent on discharge equals  45 minutes.  SignedOren Binet 07/03/2015 11:46 AM

## 2015-07-03 NOTE — Discharge Instructions (Signed)
Pulmonary Edema Pulmonary edema (PE) is a condition in which fluid collects in the lungs. This makes it hard to breathe. PE may be a result of the heart not pumping very well or a result of injury.  CAUSES   Coronary artery disease causes blockages in the arteries of the heart. This deprives the heart muscle of oxygen and weakens the muscle. A heart attack is a form of coronary artery disease.  High blood pressure causes the heart muscle to work harder than usual. Over time, the heart muscle may get stiff, and it starts to work less efficiently. It may also fatigue and weaken.  Viral infection of the heart (myocarditis) may weaken the heart muscle.  Metabolic conditions such as thyroid disease, excessive alcohol use, certain vitamin deficiencies, or diabetes may also weaken the heart muscle.  Leaky or stiff heart valves may impair normal heart function.  Lung disease may strain the heart muscle.  Excessive demands on the heart such as too much salt or fluid intake.  Failure to take prescribed medicines.  Lung injury from heat or toxins, such as poisonous gas.  Infection in the lungs or other parts of the body.  Fluid overload caused by kidney failure or medicines. SYMPTOMS   Shortness of breath at rest or with exertion.  Grunting, wheezing, or gurgling while breathing.  Feeling like you cannot get enough air.  Breaths are shallow and fast.  A lot of coughing with frothy or bloody mucus.  Skin may become cool, damp, and turn a pale or bluish color. DIAGNOSIS  Initial diagnosis may be based on your history, symptoms, and a physical examination. Additional tests for PE may include:  Electrocardiography.  Chest X-ray.  Blood tests.  Stress test.  Ultrasound evaluation of the heart (echocardiography).  Evaluation by a heart doctor (cardiologist).  Test of the heart arteries to look for blockages (angiography).  Check of blood oxygen. TREATMENT  Treatment of PE will  depend on the underlying cause and will focus first on relieving the symptoms.   Extra oxygen to make breathing easier and assist with removing mucus. This may include breathing treatments or a tube into the lungs and a breathing machine.  Medicine to help the body get rid of extra water, usually through an IV tube.  Medicine to help the heart pump better.  If poor heart function is the cause, treatment may include:  Procedures to open blocked arteries, repair damaged heart valves, or remove some of the damaged heart muscle.  A pacemaker to help the heart pump with less effort. HOME CARE INSTRUCTIONS   Your health care provider will help you determine what type of exercise program may be helpful. It is important to maintain strength and increase it if possible. Pace your activities to avoid shortness of breath or chest pain. Rest for at least 1 hour before and after meals. Cardiac rehabilitation programs are available in some locations.  Eat a heart-healthy diet low in salt, saturated fat, and cholesterol. Ask for help with choices.  Make a list of every medicine, vitamin, or herbal supplement you are taking. Keep the list with you at all times. Show it to your health care provider at every visit and before starting a new medicine. Keep the list up to date.  Ask your health care provider or pharmacist to help you write a plan or schedule so that you know things about each medicine such as:  Why you are taking it.  The possible side effects.  The best time of day to take it.  Foods to take with it or avoid.  When to stop taking it.  Record your hospital or clinic weight. When you get home, compare it to your scale and record your weight. Then, weigh yourself first thing in the morning daily, and record the weights. You should weigh yourself every morning after you urinate and before you eat breakfast. Wear the same amount of clothing each time you weigh yourself. Provide your health  care provider with your weight record. Daily weights are important in the early recognition of excess fluid. Tell your health care provider right away if you have gained 03 lb/1.4 kg in 1 day, 05 lb/2.3 kg in a week, or as directed by your health care provider. Your medicines may need to be adjusted.  Blood pressure monitoring should be done as often as directed. You can get a home blood pressure cuff at your drugstore. Record these values and bring them with you for your clinic visits. Notify your health care provider if you become dizzy or light-headed when standing up.  If you are currently a smoker, it is time to quit. Nicotine makes your heart work harder and is one of the leading causes of cardiac deaths. Do not use nicotine gum or patches before talking to your doctor.  Make a follow-up appointment with your health care provider as directed.  Ask your health care provider for a copy of your latest heart tracing (ECG) and keep a copy with you at all times. SEEK IMMEDIATE MEDICAL CARE IF:   You have severe chest pain, especially if the pain is crushing or pressure-like and spreads to the arms, back, neck, or jaw. THIS IS AN EMERGENCY. Do not wait to see if the pain will go away. Call for local emergency medical help. Do not drive yourself to the hospital.  You have sweating, feel sick to your stomach (nauseous), or are experiencing shortness of breath.  Your weight increases by 03 lb/1.4 kg in 1 day or 05 lb/2.3 kg in a week.  You notice increasing shortness of breath that is unusual for you. This may happen during rest, sleep, or with activity.  You develop chest pain (angina) or pain that is unusual for you.  You notice more swelling in your hands, feet, ankles, or abdomen.  You notice lasting (persistent) dizziness, blurred vision, headache, or unsteadiness.  You begin to cough up bloody mucus (sputum).  You are unable to sleep because it is hard to breathe.  You begin to feel a  "jumping" or "fluttering" sensation (palpitations) in the chest that is unusual for you. MAKE SURE YOU:  Understand these instructions.  Will watch your condition.  Will get help right away if you are not doing well or get worse.   This information is not intended to replace advice given to you by your health care provider. Make sure you discuss any questions you have with your health care provider.   Document Released: 10/03/2002 Document Revised: 07/18/2013 Document Reviewed: 03/20/2013 Elsevier Interactive Patient Education [employer].

## 2015-07-03 NOTE — Progress Notes (Signed)
Pt discharged to home via W/C, condition stable, d/c teaching complete with verbal understanding expressed by pt and spouse.  Edward Qualia RN

## 2015-07-12 ENCOUNTER — Ambulatory Visit: Payer: BC Managed Care – PPO | Admitting: *Deleted

## 2015-07-12 DIAGNOSIS — B353 Tinea pedis: Secondary | ICD-10-CM

## 2015-07-12 NOTE — Progress Notes (Signed)
Patient ID: Destiny Day, female   DOB: 1951/07/26, 64 y.o.   MRN: [id number] Patient presents to be scanned and measured for diabetic shoes and inserts.

## 2015-08-13 ENCOUNTER — Encounter (HOSPITAL_COMMUNITY): Payer: Self-pay | Admitting: *Deleted

## 2015-08-13 ENCOUNTER — Emergency Department (HOSPITAL_COMMUNITY)
Admission: EM | Admit: 2015-08-13 | Discharge: 2015-08-13 | Disposition: A | Discharge status: Home / Self Care | Payer: BC Managed Care – PPO | Source: Home / Self Care | Attending: Emergency Medicine | Admitting: Emergency Medicine

## 2015-08-13 DIAGNOSIS — M75102 Unspecified rotator cuff tear or rupture of left shoulder, not specified as traumatic: Secondary | ICD-10-CM | POA: Diagnosis not present

## 2015-08-13 DIAGNOSIS — M5412 Radiculopathy, cervical region: Secondary | ICD-10-CM

## 2015-08-13 LAB — POCT I-STAT, CHEM 8
BUN: 66 mg/dL — ABNORMAL HIGH (ref 6–20)
Calcium, Ion: 1.02 mmol/L — ABNORMAL LOW (ref 1.13–1.30)
Chloride: 89 mmol/L — ABNORMAL LOW (ref 101–111)
Creatinine, Ser: 4 mg/dL — ABNORMAL HIGH (ref 0.44–1.00)
Glucose, Bld: 238 mg/dL — ABNORMAL HIGH (ref 65–99)
HCT: 36 % (ref 36.0–46.0)
Hemoglobin: 12.2 g/dL (ref 12.0–15.0)
Potassium: 3.2 mmol/L — ABNORMAL LOW (ref 3.5–5.1)
Sodium: 134 mmol/L — ABNORMAL LOW (ref 135–145)
TCO2: 33 mmol/L (ref 0–100)

## 2015-08-13 MED ORDER — ONDANSETRON 4 MG PO TBDP
4.0000 mg | ORAL_TABLET | Freq: Once | ORAL | Status: AC
  Administered 2015-08-13: 4 mg via ORAL

## 2015-08-13 MED ORDER — GABAPENTIN 300 MG PO CAPS: 300.0000 mg | ORAL_CAPSULE | Freq: Every day | ORAL | Status: DC

## 2015-08-13 MED ORDER — ONDANSETRON 4 MG PO TBDP: 4.0000 mg | ORAL_TABLET | Freq: Three times a day (TID) | ORAL | Status: DC | PRN

## 2015-08-13 MED ORDER — MORPHINE SULFATE (PF) 2 MG/ML IV SOLN
INTRAVENOUS | Status: AC
  Filled 2015-08-13: qty 1

## 2015-08-13 MED ORDER — MORPHINE SULFATE (PF) 2 MG/ML IV SOLN
2.0000 mg | Freq: Once | INTRAVENOUS | Status: AC
  Administered 2015-08-13: 2 mg via INTRAMUSCULAR

## 2015-08-13 MED ORDER — OXYCODONE-ACETAMINOPHEN 5-325 MG PO TABS: 2.0000 | ORAL_TABLET | ORAL | Status: DC | PRN

## 2015-08-13 MED ORDER — ONDANSETRON 4 MG PO TBDP
ORAL_TABLET | ORAL | Status: AC
Start: 2015-08-13 — End: 2015-08-13
  Filled 2015-08-13: qty 1

## 2015-08-13 NOTE — Discharge Instructions (Signed)
Please avoid lifting or carrying anything more than 5 pounds. Take gabapentin 300 mg daily to help with the pinched nerve pain. Use the Percocet every 4-6 hours as needed for pain. Use the Zofran every 8 hours as needed for nausea or vomiting. Please follow-up with Dr. Joya Salm in the next few weeks.

## 2015-08-13 NOTE — ED Provider Notes (Signed)
CSN: [id number]     Arrival date & time 08/13/15  1349 History   First MD Initiated Contact with Patient 08/13/15 1439     Chief Complaint  Patient presents with  . Shoulder Pain   (Consider location/radiation/quality/duration/timing/severity/associated sxs/prior Treatment) HPI  She is a 65 year old woman here for evaluation of left shoulder and arm pain. She states for the last 4 days she has had severe pain in her left neck, shoulder, and into her left arm. She has a known left cervical radiculopathy as well as a torn rotator cuff in the left shoulder. She was supposed to get surgery for this, but that was put on hold due to kidney problems. She is not currently on dialysis, but has had a fistula placed. She is also been vomiting the last several days. She attributes this to the pain. She denies any specific injury or trauma. Her husband states that she has been carrying heavy laundry baskets often for the last several days.  She states she tried taking a hydrocodone on without improvement.  The pain goes from her left neck down the back of her arm and into her fingers. She also has some radiating down her shoulder blade. She denies any weakness.  Shoulder MRI in October 2015 shows complete tear of supraspinatus as well as tendinosis of infraspinatus and biceps tendon. Cervical spine MRI in October 2015 shows severe foraminal narrowing on the left, particularly at C3-C4.  Past Medical History  Diagnosis Date  . Hypertension   . Diverticulitis   . Hyperlipidemia   . Asthma   . Diabetes mellitus     insulin dependent  . Arthritis     knee  . GI bleed 03/31/2013  . CHF (congestive heart failure) ([place])   . Osteoporosis   . Seasonal allergies   . Anxiety   . Abdominal bruit   . Chronic kidney disease     stage 3  . Sleep apnea     can't afford cpap  . Pneumonia   . GERD (gastroesophageal reflux disease)     from medications  . Complication of anesthesia     " after I got home from  my last procedure, I started itching."  . Anemia    Past Surgical History  Procedure Laterality Date  . Abdominal hysterectomy  1993`  . Esophagogastroduodenoscopy N/A 03/31/2013    Procedure: ESOPHAGOGASTRODUODENOSCOPY (EGD);  Surgeon: Gatha Mayer, MD;  Location: [facility] ENDOSCOPY;  Service: Endoscopy;  Laterality: N/A;  . Eye surgery      laser surgery  . Bascilic vein transposition Left 07/10/2014    Procedure: BASCILIC VEIN TRANSPOSITION;  Surgeon: Angelia Mould, MD;  Location: [place];  Service: Vascular;  Laterality: Left;  . Fistulogram Left 10/29/2014    Procedure: FISTULOGRAM;  Surgeon: Angelia Mould, MD;  Location: [facility] CATH LAB;  Service: Cardiovascular;  Laterality: Left;  . Bascilic vein transposition Right 11/08/2014    Procedure: FIRST STAGE BASILIC VEIN TRANSPOSITION;  Surgeon: Angelia Mould, MD;  Location: [place];  Service: Vascular;  Laterality: Right;  . Bascilic vein transposition Right 01/18/2015    Procedure: SECOND STAGE BASILIC VEIN TRANSPOSITION;  Surgeon: Angelia Mould, MD;  Location: [facility] OR;  Service: Vascular;  Laterality: Right;  . Patch angioplasty Right 01/18/2015    Procedure: BASILIC VEIN PATCH ANGIOPLASTY USING VASCUGUARD PATCH;  Surgeon: Angelia Mould, MD;  Location: [facility] OR;  Service: Vascular;  Laterality: Right;   Family History  Problem Relation Age of Onset  .  Colon cancer Neg Hx   . Other Mother     not sure of cause of death  . Diabetes Father   . Pancreatic cancer Maternal Grandmother    Social History  Substance Use Topics  . Smoking status: Former Smoker -- 0.35 packs/day for 40 years    Types: Cigarettes    Quit date: 05/10/2012  . Smokeless tobacco: Never Used  . Alcohol Use: No   OB History    No data available     Review of Systems As in history of present illness Allergies  Lisinopril and Prednisone  Home Medications   Prior to Admission medications   Medication Sig Start Date End Date Taking?  Authorizing Provider  albuterol (PROVENTIL HFA;VENTOLIN HFA) 108 (90 BASE) MCG/ACT inhaler Inhale 1-2 puffs into the lungs every 6 (six) hours as needed for wheezing or shortness of breath.    Historical Provider, MD  amLODipine (NORVASC) 2.5 MG tablet Take 1 tablet (2.5 mg total) by mouth daily. 07/03/15   Shanker Kristeen Mans, MD  aspirin 81 MG chewable tablet Chew 81 mg by mouth daily.    Historical Provider, MD  atorvastatin (LIPITOR) 20 MG tablet Take 20 mg by mouth daily at 6 PM.    Historical Provider, MD  budesonide-formoterol (SYMBICORT) 160-4.5 MCG/ACT inhaler Inhale 1 puff into the lungs every evening.    Historical Provider, MD  calcitRIOL (ROCALTROL) 0.25 MCG capsule Take 0.25 mcg by mouth daily.    Historical Provider, MD  carvedilol (COREG) 12.5 MG tablet Take 2 tablets (25 mg total) by mouth 2 (two) times daily with a meal. 07/03/15   Shanker Kristeen Mans, MD  chlorpheniramine-HYDROcodone (TUSSIONEX) 10-8 MG/5ML SUER Take 5 mLs by mouth every 12 (twelve) hours as needed for cough. 07/03/15   Shanker Kristeen Mans, MD  clotrimazole (LOTRIMIN) 1 % cream Apply 1 application topically 2 (two) times daily. 06/11/15   Max T Hyatt, DPM  ferrous sulfate 325 (65 FE) MG tablet Take 325 mg by mouth daily with breakfast.    Historical Provider, MD  gabapentin (NEURONTIN) 300 MG capsule Take 1 capsule (300 mg total) by mouth daily. 08/13/15   Melony Overly, MD  insulin aspart (NOVOLOG) 100 UNIT/ML injection Inject 0-8 Units into the skin 3 (three) times daily before meals. Sliding scale    Historical Provider, MD  isosorbide-hydrALAZINE (BIDIL) 20-37.5 MG tablet Take 1 tablet by mouth 2 (two) times daily. 07/03/15   Shanker Kristeen Mans, MD  LEVEMIR FLEXTOUCH 100 UNIT/ML Pen Inject 10 Units into the muscle at bedtime.  01/16/15   Historical Provider, MD  loratadine (CLARITIN) 10 MG tablet Take 10 mg by mouth daily as needed for allergies.    Historical Provider, MD  metolazone (ZAROXOLYN) 2.5 MG tablet Take 1 tablet  (2.5 mg total) by mouth every 3 (three) days. 07/03/15   Shanker Kristeen Mans, MD  multivitamin (RENA-VIT) TABS tablet Take 1 tablet by mouth daily. 01/24/15   Historical Provider, MD  omeprazole (PRILOSEC) 20 MG capsule Take 20 mg by mouth daily.    Historical Provider, MD  ondansetron (ZOFRAN ODT) 4 MG disintegrating tablet Take 1 tablet (4 mg total) by mouth every 8 (eight) hours as needed for nausea or vomiting. 08/13/15   Melony Overly, MD  ondansetron (ZOFRAN) 4 MG tablet Take 1 tablet (4 mg total) by mouth every 8 (eight) hours as needed for nausea or vomiting. 06/14/15   Thurnell Lose, MD  oxyCODONE-acetaminophen (PERCOCET/ROXICET) 5-325 MG tablet Take 2 tablets  by mouth every 4 (four) hours as needed for severe pain. 08/13/15   Melony Overly, MD  potassium chloride SA (K-DUR,KLOR-CON) 20 MEQ tablet Take 1 tablet (20 mEq total) by mouth 2 (two) times daily. 06/17/15   Thurnell Lose, MD  torsemide (DEMADEX) 100 MG tablet Take 1 tablet (100 mg total) by mouth daily. 06/17/15   Thurnell Lose, MD   Meds Ordered and Administered this Visit   Medications  ondansetron (ZOFRAN-ODT) disintegrating tablet 4 mg (4 mg Oral Given 08/13/15 1517)  morphine 2 MG/ML injection 2 mg (2 mg Intramuscular Given 08/13/15 1517)    BP 209/98 mmHg  Pulse 85  Temp(Src) 98.2 F (36.8 C) (Oral)  Resp 16  SpO2 98% No data found.   Physical Exam  Constitutional: She appears well-developed and well-nourished. She appears distressed.  Neck: Neck supple.  Positive Spurling's on the left.  Left grip and interosseous is very slightly decreased compared to the right.  Cardiovascular: Regular rhythm and normal heart sounds.   No murmur heard. Pulmonary/Chest: Effort normal and breath sounds normal. No respiratory distress. She has no wheezes. She has no rales.  Musculoskeletal:  Left shoulder: Mid exam due to pain. Diffusely tender to palpation over the anterior and posterior shoulder.    ED Course  Procedures  (including critical care time)  Labs Review Labs Reviewed  POCT I-STAT, CHEM 8 - Abnormal; Notable for the following:    Sodium 134 (*)    Potassium 3.2 (*)    Chloride 89 (*)    BUN 66 (*)    Creatinine, Ser 4.00 (*)    Glucose, Bld 238 (*)    Calcium, Ion 1.02 (*)    All other components within normal limits    Imaging Review No results found.   MDM   1. Cervical radiculopathy   2. Left rotator cuff tear    Pain and nausea is much improved after Zofran and IM morphine. Her kidney function is stable with a creatinine of 4. Potassium slightly low at 3.2, but this is likely due to her vomiting. We'll discharge home with Zofran as needed for nausea or vomiting. Gabapentin 3 mg daily for neuropathic pain. Prescription for Percocet 5-325, #20 tablets given to use as needed for pain. No heavy lifting or carrying. Recommended follow-up with her neurosurgeon, Dr. Joya Salm, to discuss nonsurgical options.    Melony Overly, MD 08/13/15 [phone]

## 2015-08-13 NOTE — ED Notes (Signed)
Pt reports     Pain       r  Shoulder   X  4  Days         denys  Any  Injury   bp  Is  High  Has not taken meds  Today  For  bp    Pt  Has   esrd  Has  Fistula  But  States  Is  Not getting  Dialysis  Yet

## 2015-08-20 ENCOUNTER — Ambulatory Visit (INDEPENDENT_AMBULATORY_CARE_PROVIDER_SITE_OTHER): Payer: BC Managed Care – PPO | Admitting: Podiatry

## 2015-08-20 DIAGNOSIS — E1140 Type 2 diabetes mellitus with diabetic neuropathy, unspecified: Secondary | ICD-10-CM

## 2015-08-20 DIAGNOSIS — M2040 Other hammer toe(s) (acquired), unspecified foot: Secondary | ICD-10-CM

## 2015-08-20 DIAGNOSIS — B353 Tinea pedis: Secondary | ICD-10-CM

## 2015-08-20 DIAGNOSIS — M79676 Pain in unspecified toe(s): Secondary | ICD-10-CM

## 2015-08-20 NOTE — Patient Instructions (Signed)

## 2015-08-20 NOTE — Progress Notes (Signed)
Patient ID: Destiny Day, female   DOB: 1950-10-12, 65 y.o.   MRN: [id number] Patient presents for diabetic shoe pick up, shoes are tried on for good fit.  Patient received 1 Pair Apex A830W Linda black in women's size 8.5 medium and 3 pairs custom molded diabetic inserts.  Verbal and written break in and wear instructions given.  Patient will follow up for scheduled routine care.

## 2015-08-23 ENCOUNTER — Ambulatory Visit: Payer: BC Managed Care – PPO | Admitting: Internal Medicine

## 2015-09-17 ENCOUNTER — Encounter: Payer: BC Managed Care – PPO | Admitting: Podiatry

## 2015-10-06 ENCOUNTER — Emergency Department (HOSPITAL_COMMUNITY): Payer: BC Managed Care – PPO

## 2015-10-06 ENCOUNTER — Encounter (HOSPITAL_COMMUNITY): Payer: Self-pay

## 2015-10-06 ENCOUNTER — Inpatient Hospital Stay (HOSPITAL_COMMUNITY)
Admission: EM | Admit: 2015-10-06 | Discharge: 2015-10-09 | DRG: 291 | Disposition: A | Discharge status: Home / Self Care | Payer: BC Managed Care – PPO | Attending: Internal Medicine | Admitting: Internal Medicine

## 2015-10-06 DIAGNOSIS — I129 Hypertensive chronic kidney disease with stage 1 through stage 4 chronic kidney disease, or unspecified chronic kidney disease: Secondary | ICD-10-CM | POA: Diagnosis present

## 2015-10-06 DIAGNOSIS — I5033 Acute on chronic diastolic (congestive) heart failure: Secondary | ICD-10-CM | POA: Diagnosis present

## 2015-10-06 DIAGNOSIS — Z90710 Acquired absence of both cervix and uterus: Secondary | ICD-10-CM

## 2015-10-06 DIAGNOSIS — Z992 Dependence on renal dialysis: Secondary | ICD-10-CM

## 2015-10-06 DIAGNOSIS — I161 Hypertensive emergency: Secondary | ICD-10-CM

## 2015-10-06 DIAGNOSIS — I160 Hypertensive urgency: Secondary | ICD-10-CM | POA: Diagnosis present

## 2015-10-06 DIAGNOSIS — E785 Hyperlipidemia, unspecified: Secondary | ICD-10-CM | POA: Diagnosis present

## 2015-10-06 DIAGNOSIS — I132 Hypertensive heart and chronic kidney disease with heart failure and with stage 5 chronic kidney disease, or end stage renal disease: Principal | ICD-10-CM | POA: Diagnosis present

## 2015-10-06 DIAGNOSIS — K219 Gastro-esophageal reflux disease without esophagitis: Secondary | ICD-10-CM | POA: Diagnosis present

## 2015-10-06 DIAGNOSIS — E1121 Type 2 diabetes mellitus with diabetic nephropathy: Secondary | ICD-10-CM | POA: Diagnosis present

## 2015-10-06 DIAGNOSIS — Z7982 Long term (current) use of aspirin: Secondary | ICD-10-CM

## 2015-10-06 DIAGNOSIS — N184 Chronic kidney disease, stage 4 (severe): Secondary | ICD-10-CM | POA: Diagnosis present

## 2015-10-06 DIAGNOSIS — M810 Age-related osteoporosis without current pathological fracture: Secondary | ICD-10-CM | POA: Diagnosis present

## 2015-10-06 DIAGNOSIS — J45909 Unspecified asthma, uncomplicated: Secondary | ICD-10-CM | POA: Diagnosis present

## 2015-10-06 DIAGNOSIS — Z800 Family history of malignant neoplasm of digestive organs: Secondary | ICD-10-CM

## 2015-10-06 DIAGNOSIS — Z794 Long term (current) use of insulin: Secondary | ICD-10-CM

## 2015-10-06 DIAGNOSIS — J9601 Acute respiratory failure with hypoxia: Secondary | ICD-10-CM | POA: Diagnosis present

## 2015-10-06 DIAGNOSIS — Z7951 Long term (current) use of inhaled steroids: Secondary | ICD-10-CM

## 2015-10-06 DIAGNOSIS — D649 Anemia, unspecified: Secondary | ICD-10-CM

## 2015-10-06 DIAGNOSIS — E1100 Type 2 diabetes mellitus with hyperosmolarity without nonketotic hyperglycemic-hyperosmolar coma (NKHHC): Secondary | ICD-10-CM

## 2015-10-06 DIAGNOSIS — J810 Acute pulmonary edema: Secondary | ICD-10-CM

## 2015-10-06 DIAGNOSIS — N186 End stage renal disease: Secondary | ICD-10-CM | POA: Diagnosis present

## 2015-10-06 DIAGNOSIS — E876 Hypokalemia: Secondary | ICD-10-CM | POA: Diagnosis present

## 2015-10-06 DIAGNOSIS — J811 Chronic pulmonary edema: Secondary | ICD-10-CM | POA: Diagnosis present

## 2015-10-06 DIAGNOSIS — E1122 Type 2 diabetes mellitus with diabetic chronic kidney disease: Secondary | ICD-10-CM | POA: Diagnosis present

## 2015-10-06 DIAGNOSIS — Z79899 Other long term (current) drug therapy: Secondary | ICD-10-CM

## 2015-10-06 DIAGNOSIS — G4733 Obstructive sleep apnea (adult) (pediatric): Secondary | ICD-10-CM | POA: Diagnosis present

## 2015-10-06 DIAGNOSIS — D631 Anemia in chronic kidney disease: Secondary | ICD-10-CM | POA: Diagnosis present

## 2015-10-06 DIAGNOSIS — N2581 Secondary hyperparathyroidism of renal origin: Secondary | ICD-10-CM | POA: Diagnosis present

## 2015-10-06 DIAGNOSIS — E1169 Type 2 diabetes mellitus with other specified complication: Secondary | ICD-10-CM | POA: Diagnosis present

## 2015-10-06 DIAGNOSIS — I10 Essential (primary) hypertension: Secondary | ICD-10-CM

## 2015-10-06 DIAGNOSIS — F419 Anxiety disorder, unspecified: Secondary | ICD-10-CM | POA: Diagnosis present

## 2015-10-06 DIAGNOSIS — J189 Pneumonia, unspecified organism: Secondary | ICD-10-CM | POA: Diagnosis present

## 2015-10-06 DIAGNOSIS — M1710 Unilateral primary osteoarthritis, unspecified knee: Secondary | ICD-10-CM | POA: Diagnosis present

## 2015-10-06 DIAGNOSIS — J9600 Acute respiratory failure, unspecified whether with hypoxia or hypercapnia: Secondary | ICD-10-CM | POA: Diagnosis present

## 2015-10-06 DIAGNOSIS — Z833 Family history of diabetes mellitus: Secondary | ICD-10-CM

## 2015-10-06 DIAGNOSIS — Z09 Encounter for follow-up examination after completed treatment for conditions other than malignant neoplasm: Secondary | ICD-10-CM

## 2015-10-06 DIAGNOSIS — Z87891 Personal history of nicotine dependence: Secondary | ICD-10-CM

## 2015-10-06 DIAGNOSIS — Z888 Allergy status to other drugs, medicaments and biological substances status: Secondary | ICD-10-CM

## 2015-10-06 LAB — I-STAT CHEM 8, ED
BUN: 61 mg/dL — ABNORMAL HIGH (ref 6–20)
Calcium, Ion: 0.92 mmol/L — ABNORMAL LOW (ref 1.13–1.30)
Chloride: 93 mmol/L — ABNORMAL LOW (ref 101–111)
Creatinine, Ser: 3.7 mg/dL — ABNORMAL HIGH (ref 0.44–1.00)
Glucose, Bld: 255 mg/dL — ABNORMAL HIGH (ref 65–99)
HCT: 37 % (ref 36.0–46.0)
Hemoglobin: 12.6 g/dL (ref 12.0–15.0)
Potassium: 3.1 mmol/L — ABNORMAL LOW (ref 3.5–5.1)
Sodium: 134 mmol/L — ABNORMAL LOW (ref 135–145)
TCO2: 28 mmol/L (ref 0–100)

## 2015-10-06 MED ORDER — NITROGLYCERIN IN D5W 200-5 MCG/ML-% IV SOLN
50.0000 ug/min | INTRAVENOUS | Status: DC
  Administered 2015-10-06: 50 ug/min via INTRAVENOUS
  Filled 2015-10-06: qty 250

## 2015-10-06 MED ORDER — ONDANSETRON HCL 4 MG/2ML IJ SOLN
4.0000 mg | Freq: Once | INTRAMUSCULAR | Status: AC
  Administered 2015-10-06: 4 mg via INTRAVENOUS

## 2015-10-06 MED ORDER — HYDRALAZINE HCL 20 MG/ML IJ SOLN
10.0000 mg | Freq: Once | INTRAMUSCULAR | Status: AC
  Administered 2015-10-06: 10 mg via INTRAVENOUS
  Filled 2015-10-06: qty 1

## 2015-10-06 MED ORDER — FUROSEMIDE 10 MG/ML IJ SOLN
100.0000 mg | Freq: Once | INTRAVENOUS | Status: AC
  Administered 2015-10-07: 100 mg via INTRAVENOUS
  Filled 2015-10-06: qty 10

## 2015-10-06 MED ORDER — NITROGLYCERIN 0.4 MG SL SUBL
0.4000 mg | SUBLINGUAL_TABLET | SUBLINGUAL | Status: DC | PRN
  Administered 2015-10-06: 0.4 mg via SUBLINGUAL

## 2015-10-06 NOTE — Procedures (Signed)
Unable to obtain ABG because pt has fistula's in both arms.

## 2015-10-06 NOTE — ED Notes (Signed)
Pt states verbally "I don't want the arterial line unless I just absolutely have to have it"

## 2015-10-06 NOTE — ED Notes (Signed)
Pt comes from home via [facility], had sudden onset of SOB, used inhaler at home with no relief, pt was speaking in no words with EMS. Pt placed on CPAP and started vomiting. Pt received PTA two nitro and zofran. Now speaking one word sentences.

## 2015-10-07 DIAGNOSIS — Z794 Long term (current) use of insulin: Secondary | ICD-10-CM | POA: Diagnosis not present

## 2015-10-07 DIAGNOSIS — I5033 Acute on chronic diastolic (congestive) heart failure: Secondary | ICD-10-CM | POA: Diagnosis present

## 2015-10-07 DIAGNOSIS — Z992 Dependence on renal dialysis: Secondary | ICD-10-CM | POA: Diagnosis not present

## 2015-10-07 DIAGNOSIS — J810 Acute pulmonary edema: Secondary | ICD-10-CM | POA: Diagnosis not present

## 2015-10-07 DIAGNOSIS — E785 Hyperlipidemia, unspecified: Secondary | ICD-10-CM | POA: Diagnosis present

## 2015-10-07 DIAGNOSIS — Z79899 Other long term (current) drug therapy: Secondary | ICD-10-CM | POA: Diagnosis not present

## 2015-10-07 DIAGNOSIS — I10 Essential (primary) hypertension: Secondary | ICD-10-CM | POA: Diagnosis present

## 2015-10-07 DIAGNOSIS — N186 End stage renal disease: Secondary | ICD-10-CM | POA: Diagnosis present

## 2015-10-07 DIAGNOSIS — E1121 Type 2 diabetes mellitus with diabetic nephropathy: Secondary | ICD-10-CM | POA: Diagnosis present

## 2015-10-07 DIAGNOSIS — Z87891 Personal history of nicotine dependence: Secondary | ICD-10-CM | POA: Diagnosis not present

## 2015-10-07 DIAGNOSIS — J9601 Acute respiratory failure with hypoxia: Secondary | ICD-10-CM

## 2015-10-07 DIAGNOSIS — F419 Anxiety disorder, unspecified: Secondary | ICD-10-CM | POA: Diagnosis present

## 2015-10-07 DIAGNOSIS — I160 Hypertensive urgency: Secondary | ICD-10-CM

## 2015-10-07 DIAGNOSIS — D631 Anemia in chronic kidney disease: Secondary | ICD-10-CM | POA: Diagnosis present

## 2015-10-07 DIAGNOSIS — N184 Chronic kidney disease, stage 4 (severe): Secondary | ICD-10-CM | POA: Diagnosis not present

## 2015-10-07 DIAGNOSIS — E1122 Type 2 diabetes mellitus with diabetic chronic kidney disease: Secondary | ICD-10-CM | POA: Diagnosis present

## 2015-10-07 DIAGNOSIS — J45909 Unspecified asthma, uncomplicated: Secondary | ICD-10-CM | POA: Diagnosis present

## 2015-10-07 DIAGNOSIS — Z888 Allergy status to other drugs, medicaments and biological substances status: Secondary | ICD-10-CM | POA: Diagnosis not present

## 2015-10-07 DIAGNOSIS — E876 Hypokalemia: Secondary | ICD-10-CM | POA: Diagnosis present

## 2015-10-07 DIAGNOSIS — M810 Age-related osteoporosis without current pathological fracture: Secondary | ICD-10-CM | POA: Diagnosis present

## 2015-10-07 DIAGNOSIS — K219 Gastro-esophageal reflux disease without esophagitis: Secondary | ICD-10-CM | POA: Diagnosis present

## 2015-10-07 DIAGNOSIS — N2581 Secondary hyperparathyroidism of renal origin: Secondary | ICD-10-CM | POA: Diagnosis present

## 2015-10-07 DIAGNOSIS — G4733 Obstructive sleep apnea (adult) (pediatric): Secondary | ICD-10-CM | POA: Diagnosis present

## 2015-10-07 DIAGNOSIS — I132 Hypertensive heart and chronic kidney disease with heart failure and with stage 5 chronic kidney disease, or end stage renal disease: Secondary | ICD-10-CM | POA: Diagnosis present

## 2015-10-07 DIAGNOSIS — Z7982 Long term (current) use of aspirin: Secondary | ICD-10-CM | POA: Diagnosis not present

## 2015-10-07 DIAGNOSIS — Z7951 Long term (current) use of inhaled steroids: Secondary | ICD-10-CM | POA: Diagnosis not present

## 2015-10-07 DIAGNOSIS — I509 Heart failure, unspecified: Secondary | ICD-10-CM | POA: Diagnosis not present

## 2015-10-07 LAB — COMPREHENSIVE METABOLIC PANEL
ALT: 51 U/L (ref 14–54)
AST: 76 U/L — ABNORMAL HIGH (ref 15–41)
Albumin: 3.4 g/dL — ABNORMAL LOW (ref 3.5–5.0)
Alkaline Phosphatase: 169 U/L — ABNORMAL HIGH (ref 38–126)
Anion gap: 16 — ABNORMAL HIGH (ref 5–15)
BUN: 59 mg/dL — ABNORMAL HIGH (ref 6–20)
CO2: 26 mmol/L (ref 22–32)
Calcium: 9.2 mg/dL (ref 8.9–10.3)
Chloride: 94 mmol/L — ABNORMAL LOW (ref 101–111)
Creatinine, Ser: 3.78 mg/dL — ABNORMAL HIGH (ref 0.44–1.00)
GFR calc Af Amer: 14 mL/min — ABNORMAL LOW (ref >60)
GFR calc non Af Amer: 12 mL/min — ABNORMAL LOW (ref >60)
Glucose, Bld: 250 mg/dL — ABNORMAL HIGH (ref 65–99)
Potassium: 3.1 mmol/L — ABNORMAL LOW (ref 3.5–5.1)
Sodium: 136 mmol/L (ref 135–145)
Total Bilirubin: 0.8 mg/dL (ref 0.3–1.2)
Total Protein: 7.5 g/dL (ref 6.5–8.1)

## 2015-10-07 LAB — GLUCOSE, CAPILLARY
Glucose-Capillary: 210 mg/dL — ABNORMAL HIGH (ref 65–99)
Glucose-Capillary: 178 mg/dL — ABNORMAL HIGH (ref 65–99)

## 2015-10-07 LAB — PROTIME-INR
INR: 1.02 (ref 0.00–1.49)
Prothrombin Time: 13.6 seconds (ref 11.6–15.2)

## 2015-10-07 LAB — CBC WITH DIFFERENTIAL/PLATELET
Basophils Absolute: 0 10*3/uL (ref 0.0–0.1)
Basophils Relative: 0 %
Eosinophils Absolute: 0.2 10*3/uL (ref 0.0–0.7)
Eosinophils Relative: 2 %
HCT: 34 % — ABNORMAL LOW (ref 36.0–46.0)
Hemoglobin: 11.1 g/dL — ABNORMAL LOW (ref 12.0–15.0)
Lymphocytes Relative: 20 %
Lymphs Abs: 1.9 10*3/uL (ref 0.7–4.0)
MCH: 29.7 pg (ref 26.0–34.0)
MCHC: 32.6 g/dL (ref 30.0–36.0)
MCV: 90.9 fL (ref 78.0–100.0)
Monocytes Absolute: 0.3 10*3/uL (ref 0.1–1.0)
Monocytes Relative: 4 %
Neutro Abs: 7 10*3/uL (ref 1.7–7.7)
Neutrophils Relative %: 74 %
Platelets: 292 10*3/uL (ref 150–400)
RBC: 3.74 MIL/uL — ABNORMAL LOW (ref 3.87–5.11)
RDW: 12.1 % (ref 11.5–15.5)
WBC: 9.5 10*3/uL (ref 4.0–10.5)

## 2015-10-07 LAB — BRAIN NATRIURETIC PEPTIDE: B Natriuretic Peptide: 799.3 pg/mL — ABNORMAL HIGH (ref 0.0–100.0)

## 2015-10-07 LAB — TROPONIN I
Troponin I: 0.04 ng/mL — ABNORMAL HIGH (ref <0.031)
Troponin I: 0.06 ng/mL — ABNORMAL HIGH (ref <0.031)
Troponin I: 0.06 ng/mL — ABNORMAL HIGH (ref <0.031)
Troponin I: 0.08 ng/mL — ABNORMAL HIGH (ref <0.031)

## 2015-10-07 LAB — MAGNESIUM: Magnesium: 1.7 mg/dL (ref 1.7–2.4)

## 2015-10-07 LAB — CBG MONITORING, ED: Glucose-Capillary: 195 mg/dL — ABNORMAL HIGH (ref 65–99)

## 2015-10-07 LAB — MRSA PCR SCREENING: MRSA by PCR: NEGATIVE

## 2015-10-07 MED ORDER — ASPIRIN 81 MG PO CHEW
81.0000 mg | CHEWABLE_TABLET | Freq: Every day | ORAL | Status: DC
  Administered 2015-10-07 – 2015-10-09 (×3): 81 mg via ORAL
  Filled 2015-10-07 (×3): qty 1

## 2015-10-07 MED ORDER — LORATADINE 10 MG PO TABS
10.0000 mg | ORAL_TABLET | Freq: Every day | ORAL | Status: DC | PRN
  Administered 2015-10-08: 10 mg via ORAL
  Filled 2015-10-07: qty 1

## 2015-10-07 MED ORDER — DM-GUAIFENESIN ER 30-600 MG PO TB12
1.0000 | ORAL_TABLET | Freq: Two times a day (BID) | ORAL | Status: DC
  Administered 2015-10-07 – 2015-10-09 (×6): 1 via ORAL
  Filled 2015-10-07 (×6): qty 1

## 2015-10-07 MED ORDER — PANTOPRAZOLE SODIUM 40 MG PO TBEC
40.0000 mg | DELAYED_RELEASE_TABLET | Freq: Every day | ORAL | Status: DC
  Administered 2015-10-07 – 2015-10-09 (×3): 40 mg via ORAL
  Filled 2015-10-07 (×3): qty 1

## 2015-10-07 MED ORDER — ZOLPIDEM TARTRATE 5 MG PO TABS: 5.0000 mg | ORAL_TABLET | Freq: Every evening | ORAL | Status: DC | PRN

## 2015-10-07 MED ORDER — SODIUM CHLORIDE 0.9 % IV SOLN: 250.0000 mL | INTRAVENOUS | Status: DC | PRN

## 2015-10-07 MED ORDER — GABAPENTIN 300 MG PO CAPS
300.0000 mg | ORAL_CAPSULE | Freq: Every day | ORAL | Status: DC
  Administered 2015-10-08 – 2015-10-09 (×2): 300 mg via ORAL
  Filled 2015-10-07 (×2): qty 1

## 2015-10-07 MED ORDER — DEXTROSE 5 % IV SOLN
500.0000 mg | INTRAVENOUS | Status: DC
  Administered 2015-10-07 – 2015-10-08 (×2): 500 mg via INTRAVENOUS
  Filled 2015-10-07 (×3): qty 500

## 2015-10-07 MED ORDER — HEPARIN SODIUM (PORCINE) 5000 UNIT/ML IJ SOLN
5000.0000 [IU] | Freq: Three times a day (TID) | INTRAMUSCULAR | Status: DC
  Administered 2015-10-07 – 2015-10-09 (×7): 5000 [IU] via SUBCUTANEOUS
  Filled 2015-10-07 (×7): qty 1

## 2015-10-07 MED ORDER — POTASSIUM CHLORIDE 20 MEQ/15ML (10%) PO SOLN
40.0000 meq | Freq: Once | ORAL | Status: AC
  Administered 2015-10-07: 40 meq via ORAL
  Filled 2015-10-07: qty 30

## 2015-10-07 MED ORDER — FUROSEMIDE 10 MG/ML IJ SOLN: 40.0000 mg | Freq: Once | INTRAMUSCULAR | Status: DC

## 2015-10-07 MED ORDER — AMLODIPINE BESYLATE 2.5 MG PO TABS
2.5000 mg | ORAL_TABLET | Freq: Every day | ORAL | Status: DC
  Administered 2015-10-08 – 2015-10-09 (×2): 2.5 mg via ORAL
  Filled 2015-10-07 (×3): qty 1

## 2015-10-07 MED ORDER — FERROUS SULFATE 325 (65 FE) MG PO TABS
325.0000 mg | ORAL_TABLET | Freq: Every day | ORAL | Status: DC
  Administered 2015-10-08 – 2015-10-09 (×2): 325 mg via ORAL
  Filled 2015-10-07 (×2): qty 1

## 2015-10-07 MED ORDER — CALCITRIOL 0.25 MCG PO CAPS
0.2500 ug | ORAL_CAPSULE | Freq: Every day | ORAL | Status: DC
  Administered 2015-10-07 – 2015-10-09 (×3): 0.25 ug via ORAL
  Filled 2015-10-07 (×3): qty 1

## 2015-10-07 MED ORDER — INSULIN DETEMIR 100 UNIT/ML FLEXPEN: 8.0000 [IU] | PEN_INJECTOR | Freq: Every day | SUBCUTANEOUS | Status: DC

## 2015-10-07 MED ORDER — ONDANSETRON HCL 4 MG/2ML IJ SOLN: 4.0000 mg | Freq: Three times a day (TID) | INTRAMUSCULAR | Status: DC | PRN

## 2015-10-07 MED ORDER — SODIUM CHLORIDE 0.9% FLUSH
3.0000 mL | Freq: Two times a day (BID) | INTRAVENOUS | Status: DC
Start: 2015-10-07 — End: 2015-10-09
  Administered 2015-10-07 – 2015-10-08 (×4): 3 mL via INTRAVENOUS

## 2015-10-07 MED ORDER — FUROSEMIDE 10 MG/ML IJ SOLN
60.0000 mg | Freq: Three times a day (TID) | INTRAMUSCULAR | Status: AC
  Administered 2015-10-07 – 2015-10-08 (×4): 60 mg via INTRAVENOUS
  Filled 2015-10-07 (×4): qty 6

## 2015-10-07 MED ORDER — FUROSEMIDE 10 MG/ML IJ SOLN: 120.0000 mg | Freq: Every day | INTRAVENOUS | Status: DC

## 2015-10-07 MED ORDER — ALPRAZOLAM 0.25 MG PO TABS
0.2500 mg | ORAL_TABLET | Freq: Two times a day (BID) | ORAL | Status: DC | PRN
  Administered 2015-10-08: 0.25 mg via ORAL
  Filled 2015-10-07: qty 1

## 2015-10-07 MED ORDER — MOMETASONE FURO-FORMOTEROL FUM 200-5 MCG/ACT IN AERO
2.0000 | INHALATION_SPRAY | Freq: Two times a day (BID) | RESPIRATORY_TRACT | Status: DC
  Administered 2015-10-08 – 2015-10-09 (×3): 2 via RESPIRATORY_TRACT
  Filled 2015-10-07 (×2): qty 8.8

## 2015-10-07 MED ORDER — OXYCODONE-ACETAMINOPHEN 5-325 MG PO TABS: 2.0000 | ORAL_TABLET | ORAL | Status: DC | PRN

## 2015-10-07 MED ORDER — DEXTROSE 5 % IV SOLN
1.0000 g | INTRAVENOUS | Status: DC
  Administered 2015-10-07 – 2015-10-08 (×2): 1 g via INTRAVENOUS
  Filled 2015-10-07 (×3): qty 10

## 2015-10-07 MED ORDER — ATORVASTATIN CALCIUM 20 MG PO TABS
20.0000 mg | ORAL_TABLET | Freq: Every day | ORAL | Status: DC
  Administered 2015-10-07 – 2015-10-08 (×2): 20 mg via ORAL
  Filled 2015-10-07 (×2): qty 1

## 2015-10-07 MED ORDER — IPRATROPIUM-ALBUTEROL 0.5-2.5 (3) MG/3ML IN SOLN: 3.0000 mL | Freq: Once | RESPIRATORY_TRACT | Status: DC

## 2015-10-07 MED ORDER — HYDROCOD POLST-CPM POLST ER 10-8 MG/5ML PO SUER
5.0000 mL | Freq: Two times a day (BID) | ORAL | Status: DC | PRN
  Administered 2015-10-08 (×2): 5 mL via ORAL
  Filled 2015-10-07 (×2): qty 5

## 2015-10-07 MED ORDER — CLOTRIMAZOLE 1 % EX CREA
1.0000 "application " | TOPICAL_CREAM | Freq: Two times a day (BID) | CUTANEOUS | Status: DC
  Administered 2015-10-08: 1 via TOPICAL
  Filled 2015-10-07 (×2): qty 15

## 2015-10-07 MED ORDER — FUROSEMIDE 10 MG/ML IJ SOLN: 20.0000 mg | Freq: Once | INTRAMUSCULAR | Status: DC

## 2015-10-07 MED ORDER — SODIUM CHLORIDE 0.9% FLUSH: 3.0000 mL | INTRAVENOUS | Status: DC | PRN

## 2015-10-07 MED ORDER — MAGNESIUM SULFATE IN D5W 10-5 MG/ML-% IV SOLN
1.0000 g | Freq: Once | INTRAVENOUS | Status: AC
  Administered 2015-10-07: 1 g via INTRAVENOUS
  Filled 2015-10-07: qty 100

## 2015-10-07 MED ORDER — IPRATROPIUM-ALBUTEROL 0.5-2.5 (3) MG/3ML IN SOLN: 3.0000 mL | RESPIRATORY_TRACT | Status: DC | PRN

## 2015-10-07 MED ORDER — POTASSIUM CHLORIDE 20 MEQ/15ML (10%) PO SOLN
40.0000 meq | Freq: Two times a day (BID) | ORAL | Status: AC
  Administered 2015-10-07 – 2015-10-08 (×4): 40 meq via ORAL
  Filled 2015-10-07 (×4): qty 30

## 2015-10-07 MED ORDER — INSULIN DETEMIR 100 UNIT/ML ~~LOC~~ SOLN
8.0000 [IU] | Freq: Every day | SUBCUTANEOUS | Status: DC
  Administered 2015-10-07 – 2015-10-08 (×3): 8 [IU] via SUBCUTANEOUS
  Filled 2015-10-07 (×4): qty 0.08

## 2015-10-07 MED ORDER — RENA-VITE PO TABS
1.0000 | ORAL_TABLET | Freq: Every day | ORAL | Status: DC
  Administered 2015-10-08 – 2015-10-09 (×2): 1 via ORAL
  Filled 2015-10-07 (×2): qty 1

## 2015-10-07 MED ORDER — ISOSORB DINITRATE-HYDRALAZINE 20-37.5 MG PO TABS
1.0000 | ORAL_TABLET | Freq: Two times a day (BID) | ORAL | Status: DC
  Administered 2015-10-07 – 2015-10-09 (×5): 1 via ORAL
  Filled 2015-10-07 (×5): qty 1

## 2015-10-07 MED ORDER — ACETAMINOPHEN 325 MG PO TABS: 650.0000 mg | ORAL_TABLET | ORAL | Status: DC | PRN

## 2015-10-07 MED ORDER — CARVEDILOL 25 MG PO TABS
25.0000 mg | ORAL_TABLET | Freq: Two times a day (BID) | ORAL | Status: DC
  Administered 2015-10-07 – 2015-10-09 (×5): 25 mg via ORAL
  Filled 2015-10-07 (×2): qty 1
  Filled 2015-10-07 (×2): qty 2
  Filled 2015-10-07 (×2): qty 1

## 2015-10-07 NOTE — Progress Notes (Signed)
Pt has active order for NTG gtt, RN was told by ED RN that order was DC.  MD paged for order clarification.

## 2015-10-07 NOTE — ED Notes (Signed)
A carb mortified, heart heathy diet ordered for patient for lunch.

## 2015-10-07 NOTE — H&P (Addendum)
[facility] History and Physical  [name] [id number] DOB: 06/14/1951 DOA: 10/06/2015  Referring physician: ED physician PCP: Velna Hatchet, MD  Specialists:   Chief Complaint: SOB  HPI: Destiny Day is a 65 y.o. female with PMH of hypertension, hyperlipidemia, diabetes mellitus, asthma, GERD, anxiety, GI bleeding, diastolic congestive heart failure, chronic kidney disease-stage IV, anemia, OSA not on CPAP, who presents with shortness of breath.  Patient reports that she started having shortness of breath and this afternoon, which has been progressively getting worse. She has a mild cough with clear mucus production, no chest pain, fever or chills. Patient has nausea, vomited once, but no abdominal pain or diarrhea. Patient does not have symptoms of a UTI, unilateral weakness.  In ED, patient was found to have elevated blood pressure at 246/116, INR 1.02, WBC 9.5, temperature normal, tachycardia, tachypnea, stable renal function. CXR showed bilateral airspace disease--> pulmonary edema versus pneumonia. Patient is admitted to inpatient for further evaluation and treatment.  EKG: Not done in ED, will get one.   Where does patient live?   At home Can patient participate in ADLs? Some   Review of Systems:   General: no fevers, chills, no changes in body weight, has fatigue HEENT: no blurry vision, hearing changes or sore throat Pulm: has dyspnea, coughing, no wheezing CV: no chest pain, no palpitations Abd: has nausea, vomiting, no abdominal pain, diarrhea, constipation GU: no dysuria, burning on urination, increased urinary frequency, hematuria  Ext: no leg edema Neuro: no unilateral weakness, numbness, or tingling, no vision change or hearing loss Skin: no rash MSK: No muscle spasm, no deformity, no limitation of range of movement in spin Heme: No easy bruising.  Travel history: No recent long distant travel.  Allergy:  Allergies  Allergen Reactions  .  Lisinopril Cough  . Prednisone Other (See Comments)    Excessive fluid buildup    Past Medical History  Diagnosis Date  . Hypertension   . Diverticulitis   . Hyperlipidemia   . Asthma   . Diabetes mellitus     insulin dependent  . Arthritis     knee  . GI bleed 03/31/2013  . CHF (congestive heart failure) ([place])   . Osteoporosis   . Seasonal allergies   . Anxiety   . Abdominal bruit   . Chronic kidney disease     stage 3  . Sleep apnea     can't afford cpap  . Pneumonia   . GERD (gastroesophageal reflux disease)     from medications  . Complication of anesthesia     " after I got home from my last procedure, I started itching."  . Anemia     Past Surgical History  Procedure Laterality Date  . Abdominal hysterectomy  1993`  . Esophagogastroduodenoscopy N/A 03/31/2013    Procedure: ESOPHAGOGASTRODUODENOSCOPY (EGD);  Surgeon: Gatha Mayer, MD;  Location: [facility] ENDOSCOPY;  Service: Endoscopy;  Laterality: N/A;  . Eye surgery      laser surgery  . Bascilic vein transposition Left 07/10/2014    Procedure: BASCILIC VEIN TRANSPOSITION;  Surgeon: Angelia Mould, MD;  Location: [place];  Service: Vascular;  Laterality: Left;  . Fistulogram Left 10/29/2014    Procedure: FISTULOGRAM;  Surgeon: Angelia Mould, MD;  Location: [facility] CATH LAB;  Service: Cardiovascular;  Laterality: Left;  . Bascilic vein transposition Right 11/08/2014    Procedure: FIRST STAGE BASILIC VEIN TRANSPOSITION;  Surgeon: Angelia Mould, MD;  Location: [place];  Service: Vascular;  Laterality: Right;  . Bascilic vein transposition Right 01/18/2015    Procedure: SECOND STAGE BASILIC VEIN TRANSPOSITION;  Surgeon: Angelia Mould, MD;  Location: [place];  Service: Vascular;  Laterality: Right;  . Patch angioplasty Right 01/18/2015    Procedure: BASILIC VEIN PATCH ANGIOPLASTY USING VASCUGUARD PATCH;  Surgeon: Angelia Mould, MD;  Location: [place];  Service: Vascular;  Laterality: Right;     Social History:  reports that she quit smoking about 3 years ago. Her smoking use included Cigarettes. She has a 14 pack-year smoking history. She has never used smokeless tobacco. She reports that she uses illicit drugs. She reports that she does not drink alcohol.  Family History:  Family History  Problem Relation Age of Onset  . Colon cancer Neg Hx   . Other Mother     not sure of cause of death  . Diabetes Father   . Pancreatic cancer Maternal Grandmother      Prior to Admission medications   Medication Sig Start Date End Date Taking? Authorizing Provider  albuterol (PROVENTIL HFA;VENTOLIN HFA) 108 (90 BASE) MCG/ACT inhaler Inhale 1-2 puffs into the lungs every 6 (six) hours as needed for wheezing or shortness of breath.    Historical Provider, MD  amLODipine (NORVASC) 2.5 MG tablet Take 1 tablet (2.5 mg total) by mouth daily. 07/03/15   Shanker Kristeen Mans, MD  aspirin 81 MG chewable tablet Chew 81 mg by mouth daily.    Historical Provider, MD  atorvastatin (LIPITOR) 20 MG tablet Take 20 mg by mouth daily at 6 PM.    Historical Provider, MD  budesonide-formoterol (SYMBICORT) 160-4.5 MCG/ACT inhaler Inhale 1 puff into the lungs every evening.    Historical Provider, MD  calcitRIOL (ROCALTROL) 0.25 MCG capsule Take 0.25 mcg by mouth daily.    Historical Provider, MD  carvedilol (COREG) 12.5 MG tablet Take 2 tablets (25 mg total) by mouth 2 (two) times daily with a meal. 07/03/15   Shanker Kristeen Mans, MD  chlorpheniramine-HYDROcodone (TUSSIONEX) 10-8 MG/5ML SUER Take 5 mLs by mouth every 12 (twelve) hours as needed for cough. 07/03/15   Shanker Kristeen Mans, MD  clotrimazole (LOTRIMIN) 1 % cream Apply 1 application topically 2 (two) times daily. 06/11/15   Max T Hyatt, DPM  ferrous sulfate 325 (65 FE) MG tablet Take 325 mg by mouth daily with breakfast.    Historical Provider, MD  gabapentin (NEURONTIN) 300 MG capsule Take 1 capsule (300 mg total) by mouth daily. 08/13/15   Melony Overly, MD   insulin aspart (NOVOLOG) 100 UNIT/ML injection Inject 0-8 Units into the skin 3 (three) times daily before meals. Sliding scale    Historical Provider, MD  isosorbide-hydrALAZINE (BIDIL) 20-37.5 MG tablet Take 1 tablet by mouth 2 (two) times daily. 07/03/15   Shanker Kristeen Mans, MD  LEVEMIR FLEXTOUCH 100 UNIT/ML Pen Inject 10 Units into the muscle at bedtime.  01/16/15   Historical Provider, MD  loratadine (CLARITIN) 10 MG tablet Take 10 mg by mouth daily as needed for allergies.    Historical Provider, MD  metolazone (ZAROXOLYN) 2.5 MG tablet Take 1 tablet (2.5 mg total) by mouth every 3 (three) days. 07/03/15   Shanker Kristeen Mans, MD  multivitamin (RENA-VIT) TABS tablet Take 1 tablet by mouth daily. 01/24/15   Historical Provider, MD  omeprazole (PRILOSEC) 20 MG capsule Take 20 mg by mouth daily.    Historical Provider, MD  ondansetron (ZOFRAN ODT) 4 MG disintegrating tablet Take 1 tablet (4 mg total) by  mouth every 8 (eight) hours as needed for nausea or vomiting. 08/13/15   Melony Overly, MD  ondansetron (ZOFRAN) 4 MG tablet Take 1 tablet (4 mg total) by mouth every 8 (eight) hours as needed for nausea or vomiting. 06/14/15   Thurnell Lose, MD  oxyCODONE-acetaminophen (PERCOCET/ROXICET) 5-325 MG tablet Take 2 tablets by mouth every 4 (four) hours as needed for severe pain. 08/13/15   Melony Overly, MD  potassium chloride SA (K-DUR,KLOR-CON) 20 MEQ tablet Take 1 tablet (20 mEq total) by mouth 2 (two) times daily. 06/17/15   Thurnell Lose, MD  torsemide (DEMADEX) 100 MG tablet Take 1 tablet (100 mg total) by mouth daily. 06/17/15   Thurnell Lose, MD    Physical Exam: Filed Vitals:   10/06/15 2259 10/06/15 2306 10/07/15 0000 10/07/15 0015  BP: 246/116 204/123 135/114 114/84  Pulse:   89 92  Temp:  97.6 F (36.4 C)    TempSrc:  Axillary    Resp:   20 26  Height:      Weight:      SpO2:   100% 100%   General: Not in acute distress HEENT:       Eyes: PERRL, EOMI, no scleral icterus.        ENT: No discharge from the ears and nose, no pharynx injection, no tonsillar enlargement.        Neck: No JVD, no bruit, no mass felt. Heme: No neck lymph node enlargement. Cardiac: S1/S2, RRR, No murmurs, No gallops or rubs. Pulm: has crackles bilaterally, no wheezing, rhonchi or rubs. Abd: Soft, nondistended, nontender, no rebound pain, no organomegaly, BS present. Ext: No pitting leg edema bilaterally. 2+DP/PT pulse bilaterally. Musculoskeletal: No joint deformities, No joint redness or warmth, no limitation of ROM in spin. Skin: No rashes.  Neuro: Alert, oriented X3, cranial nerves II-XII grossly intact, moves all extremities normally. Psych: Patient is not psychotic, no suicidal or hemocidal ideation.  Labs on Admission:  Basic Metabolic Panel:  Recent Labs Lab 10/06/15 2338 10/06/15 2348  NA 134* 136  K 3.1* 3.1*  CL 93* 94*  CO2  --  26  GLUCOSE 255* 250*  BUN 61* 59*  CREATININE 3.70* 3.78*  CALCIUM  --  9.2   Liver Function Tests:  Recent Labs Lab 10/06/15 2348  AST 76*  ALT 51  ALKPHOS 169*  BILITOT 0.8  PROT 7.5  ALBUMIN 3.4*   No results for input(s): LIPASE, AMYLASE in the last 168 hours. No results for input(s): AMMONIA in the last 168 hours. CBC:  Recent Labs Lab 10/06/15 2338 10/06/15 2348  WBC  --  9.5  NEUTROABS  --  7.0  HGB 12.6 11.1*  HCT 37.0 34.0*  MCV  --  90.9  PLT  --  292   Cardiac Enzymes:  Recent Labs Lab 10/06/15 2348  TROPONINI 0.04*    BNP (last 3 results)  Recent Labs  02/03/15 1337 03/15/15 0225 06/30/15 2043  BNP 895.3* 1025.4* 1532.5*    ProBNP (last 3 results) No results for input(s): PROBNP in the last 8760 hours.  CBG: No results for input(s): GLUCAP in the last 168 hours.  Radiological Exams on Admission: Dg Chest Port 1 View  10/07/2015  CLINICAL DATA:  Acute onset of dyspnea.  Initial encounter. EXAM: PORTABLE CHEST 1 VIEW COMPARISON:  Chest radiograph performed 06/30/2015 FINDINGS: The lungs  are well-aerated. Bilateral airspace opacification, more prominent on the right, may reflect pulmonary edema or pneumonia. No definite  pleural effusion or pneumothorax is seen. The cardiomediastinal silhouette is borderline enlarged. No acute osseous abnormalities are seen. IMPRESSION: Bilateral airspace opacification, more prominent on the right, may reflect pulmonary edema or pneumonia. Borderline cardiomegaly. Electronically Signed   By: Garald Balding M.D.   On: 10/07/2015 00:04    Assessment/Plan Principal Problem:   Acute respiratory failure (HCC) Active Problems:   HLD (hyperlipidemia)   Asthma   Diastolic CHF, acute on chronic (HCC)   Hypertensive urgency   Pulmonary edema   Type 2 diabetes mellitus with diabetic nephropathy (HCC)   CKD (chronic kidney disease) stage 4, GFR 15-29 ml/min (HCC)   Acute pulmonary edema (HCC)   GERD (gastroesophageal reflux disease)   Acute respiratory failure with hypoxia due to flash pulmonary edema 2/2 hypertensive urgency: Patient's blood pressure is elevated at 246/116. She bilateral crackles on lung auscultation. CXR is consistent with pulmonary edema. The patient does not have cough, fever, chills, chest pain, less likely to have  pneumonia. -will admit to SDU-->change to tele -Nitroglycerin drip - lasix 120 mg daily -trop x 3 -2d echo -will continue home coreg and ASA -Daily weights -strict I/O's -Low salt diet -prn BiAP  Addendum: pt's blood bp has improved, and is off of BiPAP-->change bed to Tele  Acute on chronic diastolic CHF: 2-D echo on 06/14/15 showed EF CT-65% with grade 2 diastolic dysfunction. Patient is on high-dose torsemide 100 mg daily and metolazone at home. She does not have leg edema, but presents with flush of pulmonary edema secondary to hypertensive urgency. -lasix 120 mg daily as above -check BNP -continue ASA and coreg  Asthma: No wheezing. -prn Duoneb nebs -Dulera inhaler  HLD: Last LDL was 79  12/01/10. -Continue home medications: Lipitor  DM-II: Last A1c 11.3 on 06/13/15, poorly controled. Patient is taking Levemir at home -will decrease Levemir dose from  10 units to 8 units daily -SSI  CKD-IV: stable. Baseline creatinine 4.0. Her creatinine is 0.7, BUN 61, which is at the baseline. -Follow-up renal function BMP  GERD: -Protonix  DVT ppx:  SQ Lovenox  Code Status: Full code Family Communication:    Yes, patient's  husband at bed side Disposition Plan: Admit to inpatient   Date of Service 10/07/2015    Ivor Costa [facility] Pager [phone]  If 7PM-7AM, please contact night-coverage [email or web address] Password [facility] 10/07/2015, 12:57 AM

## 2015-10-07 NOTE — ED Notes (Signed)
Pt is stable and wants to take biPaP off. Respiratory therapist in room to remove cPAP and placed pt on 2L [place]. Pt tolerating well.

## 2015-10-07 NOTE — Procedures (Signed)
Pt requested to be removed from bipap.  RT placed pt on 2L [place].  Pt is resting comfortably without any distress.

## 2015-10-07 NOTE — Progress Notes (Addendum)
Patient seen and examined  65 y.o. female with PMH of hypertension, hyperlipidemia, diabetes mellitus, asthma, GERD, anxiety, GI bleeding, diastolic congestive heart failure, chronic kidney disease-stage IV, anemia, OSA not on CPAP, who presents with shortness of breath.comes from home via [facility], had sudden onset of SOB, used inhaler at home with no relief, pt was speaking in no words with EMS  Assessment and plan Acute respiratory failure with hypoxia due to flash pulmonary edema 2/2 hypertensive urgency: Patient's blood pressure was elevated at 246/116. She had bilateral crackles on lung auscultation. CXR is consistent with pulmonary edema. The patient does not have cough, fever, chills, chest pain, less likely to have pneumonia. -will admit to SDU-->change to tele, slightly abnormal enzymes likely in the setting of hypertensive urgency , continue to trend  -Nitroglycerin drip, titrate this off for systolic less than [id number] Received Lasix 120 mg in the ER for pulmonary edema, continue Lasix 60 mg IV every 8 Abnormal troponin in the setting of chronic kidney disease, continue to follow -2d echo pending -will continue home coreg and ASA -Daily weights -strict I/O's -Low salt diet -prn BiAP  Probable community-acquired pneumonia Patient started on Rocephin/azithromycin, continue antibiotics for now and repeat chest x-ray to rule out pneumonia definitively tomorrow  Acute on chronic diastolic CHF: 2-D echo on 06/14/15 showed EF CT-65% with grade 2 diastolic dysfunction. Patient is on high-dose torsemide 100 mg daily and metolazone at home. She does not have leg edema, but presents with flush of pulmonary edema secondary to hypertensive urgency. -lasix 120 mg daily as above -check BNP -continue ASA and coreg  Hypokalemia-replete  Hypertensive urgency extensive workup in the outpatient setting for secondary causes of hypertension including renal artery duplex. Blood pressure continues to  fluctuate-it is particularly elevated this morning, continue Coreg, BiDil and Lasix, and low-dose amlodipine  Asthma: No wheezing. -prn Duoneb nebs -Dulera inhaler  HLD: Last LDL was 79 12/01/10. -Continue home medications: Lipitor  DM-II: Last A1c 11.3 on 06/13/15, poorly controled. Patient is taking Levemir at home -will decrease Levemir dose from 10 units to 8 units daily -SSI  CKD-IV: stable. Baseline creatinine 4.0. Her creatinine is 0.7, BUN 61, which is at the baseline. -Follow-up renal function BMP primary nephrologist on 12/5-Dr. Lorrene Reid  GERD: -Protonix

## 2015-10-07 NOTE — ED Notes (Signed)
Attempted report 

## 2015-10-07 NOTE — ED Notes (Signed)
Dr Kathrynn Humble ordered to stop Nitro if BP is less than the last reading of 114/84

## 2015-10-07 NOTE — ED Notes (Signed)
CBG 195 

## 2015-10-07 NOTE — ED Notes (Signed)
Pt requested cough medicine and Dr Blaine Hamper notified.

## 2015-10-07 NOTE — ED Provider Notes (Signed)
CSN: [id number]     Arrival date & time 10/06/15  2247 History   First MD Initiated Contact with Patient 10/06/15 2242     Chief Complaint  Patient presents with  . Respiratory Distress     (Consider location/radiation/quality/duration/timing/severity/associated sxs/prior Treatment) HPI Comments: LEVEL 5 CAVEAT DUE TO RESPIRATORY DISTRESS 65 y.o. female with h/o CKD stage 4, malignant HTN comes in with cc of chest pain and dib.  Pt reports sudden onset dib prior to ER arrival. She has been having some non productive cough, but got acutely worse prior to ER arrival. EMS reports SBP of 240 mmhg. She received nitro SL and was started on bipap. Pt started having emesis right as EMS arrived to the ER.  Pt reports no fevers. Husband at bedside shortly after - reports that she has had similar attacks in the past with fluid around her long and elevated BP. Pt reports taking her meds as prescribed. Her nausea got better with zofran - and she agreed another trial of bipap.  Pt denies any fevers, chills. No sick contacts.   The history is provided by the patient and the EMS personnel. The history is limited by the condition of the patient.    Past Medical History  Diagnosis Date  . Hypertension   . Diverticulitis   . Hyperlipidemia   . Asthma   . Diabetes mellitus     insulin dependent  . Arthritis     knee  . GI bleed 03/31/2013  . CHF (congestive heart failure) ([place])   . Osteoporosis   . Seasonal allergies   . Anxiety   . Abdominal bruit   . Chronic kidney disease     stage 3  . Sleep apnea     can't afford cpap  . Pneumonia   . GERD (gastroesophageal reflux disease)     from medications  . Complication of anesthesia     " after I got home from my last procedure, I started itching."  . Anemia    Past Surgical History  Procedure Laterality Date  . Abdominal hysterectomy  1993`  . Esophagogastroduodenoscopy N/A 03/31/2013    Procedure: ESOPHAGOGASTRODUODENOSCOPY (EGD);   Surgeon: Gatha Mayer, MD;  Location: [facility] ENDOSCOPY;  Service: Endoscopy;  Laterality: N/A;  . Eye surgery      laser surgery  . Bascilic vein transposition Left 07/10/2014    Procedure: BASCILIC VEIN TRANSPOSITION;  Surgeon: Angelia Mould, MD;  Location: [place];  Service: Vascular;  Laterality: Left;  . Fistulogram Left 10/29/2014    Procedure: FISTULOGRAM;  Surgeon: Angelia Mould, MD;  Location: [facility] CATH LAB;  Service: Cardiovascular;  Laterality: Left;  . Bascilic vein transposition Right 11/08/2014    Procedure: FIRST STAGE BASILIC VEIN TRANSPOSITION;  Surgeon: Angelia Mould, MD;  Location: [place];  Service: Vascular;  Laterality: Right;  . Bascilic vein transposition Right 01/18/2015    Procedure: SECOND STAGE BASILIC VEIN TRANSPOSITION;  Surgeon: Angelia Mould, MD;  Location: [facility] OR;  Service: Vascular;  Laterality: Right;  . Patch angioplasty Right 01/18/2015    Procedure: BASILIC VEIN PATCH ANGIOPLASTY USING VASCUGUARD PATCH;  Surgeon: Angelia Mould, MD;  Location: [facility];  Service: Vascular;  Laterality: Right;   Family History  Problem Relation Age of Onset  . Colon cancer Neg Hx   . Other Mother     not sure of cause of death  . Diabetes Father   . Pancreatic cancer Maternal Grandmother    Social History  Substance Use Topics  . Smoking status: Former Smoker -- 0.35 packs/day for 40 years    Types: Cigarettes    Quit date: 05/10/2012  . Smokeless tobacco: Never Used  . Alcohol Use: No   OB History    No data available     Review of Systems  Unable to perform ROS: Severe respiratory distress      Allergies  Lisinopril and Prednisone  Home Medications   Prior to Admission medications   Medication Sig Start Date End Date Taking? Authorizing Provider  albuterol (PROVENTIL HFA;VENTOLIN HFA) 108 (90 BASE) MCG/ACT inhaler Inhale 1-2 puffs into the lungs every 6 (six) hours as needed for wheezing or shortness of breath.    Historical  Provider, MD  amLODipine (NORVASC) 2.5 MG tablet Take 1 tablet (2.5 mg total) by mouth daily. 07/03/15   Shanker Kristeen Mans, MD  aspirin 81 MG chewable tablet Chew 81 mg by mouth daily.    Historical Provider, MD  atorvastatin (LIPITOR) 20 MG tablet Take 20 mg by mouth daily at 6 PM.    Historical Provider, MD  budesonide-formoterol (SYMBICORT) 160-4.5 MCG/ACT inhaler Inhale 1 puff into the lungs every evening.    Historical Provider, MD  calcitRIOL (ROCALTROL) 0.25 MCG capsule Take 0.25 mcg by mouth daily.    Historical Provider, MD  carvedilol (COREG) 12.5 MG tablet Take 2 tablets (25 mg total) by mouth 2 (two) times daily with a meal. 07/03/15   Shanker Kristeen Mans, MD  chlorpheniramine-HYDROcodone (TUSSIONEX) 10-8 MG/5ML SUER Take 5 mLs by mouth every 12 (twelve) hours as needed for cough. 07/03/15   Shanker Kristeen Mans, MD  clotrimazole (LOTRIMIN) 1 % cream Apply 1 application topically 2 (two) times daily. 06/11/15   Max T Hyatt, DPM  ferrous sulfate 325 (65 FE) MG tablet Take 325 mg by mouth daily with breakfast.    Historical Provider, MD  gabapentin (NEURONTIN) 300 MG capsule Take 1 capsule (300 mg total) by mouth daily. 08/13/15   Melony Overly, MD  insulin aspart (NOVOLOG) 100 UNIT/ML injection Inject 0-8 Units into the skin 3 (three) times daily before meals. Sliding scale    Historical Provider, MD  isosorbide-hydrALAZINE (BIDIL) 20-37.5 MG tablet Take 1 tablet by mouth 2 (two) times daily. 07/03/15   Shanker Kristeen Mans, MD  LEVEMIR FLEXTOUCH 100 UNIT/ML Pen Inject 10 Units into the muscle at bedtime.  01/16/15   Historical Provider, MD  loratadine (CLARITIN) 10 MG tablet Take 10 mg by mouth daily as needed for allergies.    Historical Provider, MD  metolazone (ZAROXOLYN) 2.5 MG tablet Take 1 tablet (2.5 mg total) by mouth every 3 (three) days. 07/03/15   Shanker Kristeen Mans, MD  multivitamin (RENA-VIT) TABS tablet Take 1 tablet by mouth daily. 01/24/15   Historical Provider, MD  omeprazole (PRILOSEC)  20 MG capsule Take 20 mg by mouth daily.    Historical Provider, MD  ondansetron (ZOFRAN ODT) 4 MG disintegrating tablet Take 1 tablet (4 mg total) by mouth every 8 (eight) hours as needed for nausea or vomiting. 08/13/15   Melony Overly, MD  ondansetron (ZOFRAN) 4 MG tablet Take 1 tablet (4 mg total) by mouth every 8 (eight) hours as needed for nausea or vomiting. 06/14/15   Thurnell Lose, MD  oxyCODONE-acetaminophen (PERCOCET/ROXICET) 5-325 MG tablet Take 2 tablets by mouth every 4 (four) hours as needed for severe pain. 08/13/15   Melony Overly, MD  potassium chloride SA (K-DUR,KLOR-CON) 20 MEQ tablet Take 1 tablet (  20 mEq total) by mouth 2 (two) times daily. 06/17/15   Thurnell Lose, MD  torsemide (DEMADEX) 100 MG tablet Take 1 tablet (100 mg total) by mouth daily. 06/17/15   Thurnell Lose, MD   BP 114/84 mmHg  Pulse 92  Temp(Src) 97.6 F (36.4 C) (Axillary)  Resp 26  Ht 5\' 4"  (1.626 m)  Wt 144 lb (65.318 kg)  BMI 24.71 kg/m2  SpO2 100% Physical Exam  Constitutional: She is oriented to person, place, and time. She appears well-developed.  HENT:  Head: Normocephalic and atraumatic.  Eyes: Conjunctivae and EOM are normal. Pupils are equal, round, and reactive to light.  Neck: Normal range of motion. Neck supple.  Cardiovascular: Normal rate, regular rhythm and normal heart sounds.   Pulmonary/Chest: Breath sounds normal. She is in respiratory distress.  Poor aeration diffusely with rales at the bases  Abdominal: Soft. Bowel sounds are normal. She exhibits no distension. There is no tenderness. There is no rebound and no guarding.  Musculoskeletal: She exhibits no edema.  Neurological: She is alert and oriented to person, place, and time.  Skin: Skin is warm and dry.  Nursing note and vitals reviewed.   ED Course  Procedures (including critical care time) Labs Review Labs Reviewed  CBC WITH DIFFERENTIAL/PLATELET - Abnormal; Notable for the following:    RBC 3.74 (*)     Hemoglobin 11.1 (*)    HCT 34.0 (*)    All other components within normal limits  COMPREHENSIVE METABOLIC PANEL - Abnormal; Notable for the following:    Potassium 3.1 (*)    Chloride 94 (*)    Glucose, Bld 250 (*)    BUN 59 (*)    Creatinine, Ser 3.78 (*)    Albumin 3.4 (*)    AST 76 (*)    Alkaline Phosphatase 169 (*)    GFR calc non Af Amer 12 (*)    GFR calc Af Amer 14 (*)    Anion gap 16 (*)    All other components within normal limits  TROPONIN I - Abnormal; Notable for the following:    Troponin I 0.04 (*)    All other components within normal limits  I-STAT CHEM 8, ED - Abnormal; Notable for the following:    Sodium 134 (*)    Potassium 3.1 (*)    Chloride 93 (*)    BUN 61 (*)    Creatinine, Ser 3.70 (*)    Glucose, Bld 255 (*)    Calcium, Ion 0.92 (*)    All other components within normal limits  PROTIME-INR  BLOOD GAS, ARTERIAL  BRAIN NATRIURETIC PEPTIDE    Imaging Review Dg Chest Port 1 View  10/07/2015  CLINICAL DATA:  Acute onset of dyspnea.  Initial encounter. EXAM: PORTABLE CHEST 1 VIEW COMPARISON:  Chest radiograph performed 06/30/2015 FINDINGS: The lungs are well-aerated. Bilateral airspace opacification, more prominent on the right, may reflect pulmonary edema or pneumonia. No definite pleural effusion or pneumothorax is seen. The cardiomediastinal silhouette is borderline enlarged. No acute osseous abnormalities are seen. IMPRESSION: Bilateral airspace opacification, more prominent on the right, may reflect pulmonary edema or pneumonia. Borderline cardiomegaly. Electronically Signed   By: Garald Balding M.D.   On: 10/07/2015 00:04   I have personally reviewed and evaluated these images and lab results as part of my medical decision-making.   EKG Interpretation None      MDM   Final diagnoses:  Acute pulmonary edema (HCC)  Malignant hypertension  Hypertensive emergency  Pt comes in with cc of dib. She is in acute hypoxic respiratory failure.  Sudden onset, no fevers, elevated BP with hx of flash pulm edema that presented similarly. Poor aeration with rales at the vase. Hx of asthma as well.  Clinical concerns are for flash pulm edema. We have started bipap, started nitro drip. Goal MAP is around 90 mmhg.   12:52 AM Pt is breathing very comfortably. Will admit to stepdown icu - nitro drip with parameters ordered.  CRITICAL CARE Performed by: Varney Biles   Total critical care time: 55 minutes  Critical care time was exclusive of separately billable procedures and treating other patients.  Critical care was necessary to treat or prevent imminent or life-threatening deterioration.  Critical care was time spent personally by me on the following activities: development of treatment plan with patient and/or surrogate as well as nursing, discussions with consultants, evaluation of patient's response to treatment, examination of patient, obtaining history from patient or surrogate, ordering and performing treatments and interventions, ordering and review of laboratory studies, ordering and review of radiographic studies, pulse oximetry and re-evaluation of patient's condition.     Varney Biles, MD 10/07/15 [phone]

## 2015-10-08 ENCOUNTER — Inpatient Hospital Stay (HOSPITAL_COMMUNITY): Payer: BC Managed Care – PPO

## 2015-10-08 LAB — COMPREHENSIVE METABOLIC PANEL
ALT: 29 U/L (ref 14–54)
AST: 26 U/L (ref 15–41)
Albumin: 3 g/dL — ABNORMAL LOW (ref 3.5–5.0)
Alkaline Phosphatase: 126 U/L (ref 38–126)
Anion gap: 13 (ref 5–15)
BUN: 68 mg/dL — ABNORMAL HIGH (ref 6–20)
CO2: 28 mmol/L (ref 22–32)
Calcium: 9.1 mg/dL (ref 8.9–10.3)
Chloride: 94 mmol/L — ABNORMAL LOW (ref 101–111)
Creatinine, Ser: 4.15 mg/dL — ABNORMAL HIGH (ref 0.44–1.00)
GFR calc Af Amer: 12 mL/min — ABNORMAL LOW (ref >60)
GFR calc non Af Amer: 10 mL/min — ABNORMAL LOW (ref >60)
Glucose, Bld: 232 mg/dL — ABNORMAL HIGH (ref 65–99)
Potassium: 3.9 mmol/L (ref 3.5–5.1)
Sodium: 135 mmol/L (ref 135–145)
Total Bilirubin: 0.6 mg/dL (ref 0.3–1.2)
Total Protein: 6.7 g/dL (ref 6.5–8.1)

## 2015-10-08 LAB — GLUCOSE, CAPILLARY
Glucose-Capillary: 189 mg/dL — ABNORMAL HIGH (ref 65–99)
Glucose-Capillary: 182 mg/dL — ABNORMAL HIGH (ref 65–99)
Glucose-Capillary: 203 mg/dL — ABNORMAL HIGH (ref 65–99)
Glucose-Capillary: 296 mg/dL — ABNORMAL HIGH (ref 65–99)
Glucose-Capillary: 253 mg/dL — ABNORMAL HIGH (ref 65–99)

## 2015-10-08 LAB — CBC
HCT: 28.9 % — ABNORMAL LOW (ref 36.0–46.0)
Hemoglobin: 9.3 g/dL — ABNORMAL LOW (ref 12.0–15.0)
MCH: 29.4 pg (ref 26.0–34.0)
MCHC: 32.2 g/dL (ref 30.0–36.0)
MCV: 91.5 fL (ref 78.0–100.0)
Platelets: 263 10*3/uL (ref 150–400)
RBC: 3.16 MIL/uL — ABNORMAL LOW (ref 3.87–5.11)
RDW: 12.2 % (ref 11.5–15.5)
WBC: 7.8 10*3/uL (ref 4.0–10.5)

## 2015-10-08 MED ORDER — INSULIN ASPART 100 UNIT/ML ~~LOC~~ SOLN
0.0000 [IU] | Freq: Three times a day (TID) | SUBCUTANEOUS | Status: DC
  Administered 2015-10-09 (×2): 3 [IU] via SUBCUTANEOUS

## 2015-10-08 MED ORDER — HYDRALAZINE HCL 20 MG/ML IJ SOLN
10.0000 mg | Freq: Four times a day (QID) | INTRAMUSCULAR | Status: DC | PRN
  Administered 2015-10-08: 10 mg via INTRAVENOUS
  Filled 2015-10-08: qty 1

## 2015-10-08 NOTE — Progress Notes (Signed)
Inpatient Diabetes Program Recommendations  AACE/ADA: New Consensus Statement on Inpatient Glycemic Control (2015)  Target Ranges:  Prepandial:   less than 140 mg/dL      Peak postprandial:   less than 180 mg/dL (1-2 hours)      Critically ill patients:  140 - 180 mg/dL   Review of Glycemic Control  Inpatient Diabetes Program Recommendations:  Correction (SSI): please add Novolog sensitive scale TID Thank you  Raoul Pitch BSN, RN,CDE Inpatient Diabetes Coordinator [phone] (team pager)

## 2015-10-08 NOTE — Clinical Documentation Improvement (Signed)
Hospitalist  Can the diagnosis of anemia be further specified?   Iron deficiency Anemia  Nutritional anemia, including the nutrition or mineral deficits  Chronic Anemia, including the suspected or known cause  Anemia of chronic disease, including the associated chronic disease state  Other  Clinically Undetermined  Document any associated diagnoses/conditions.   Supporting Information: Patient with a history of anemia per 3/13 progress notes. H/H: 3/14: 9.3/28.9.   Please exercise your independent, professional judgment when responding. A specific answer is not anticipated or expected.   Thank You,  [place] [phone]

## 2015-10-08 NOTE — Clinical Documentation Improvement (Signed)
Hospitalist  Abnormal Lab/Test Results:   Bun/creat: 3/12:  61 / 3.70 3/14:  68 / 4.15  Possible Clinical Conditions associated with below indicators  Acute Renal Failure on Chronic Kidney Disease, Stage IV  Other Condition  Cannot Clinically Determine   Supporting Information: Patient with a history of chronic kidney disease, stage IV per 3/13 progress notes.   Please exercise your independent, professional judgment when responding. A specific answer is not anticipated or expected.   Thank You,  [place] [phone]

## 2015-10-08 NOTE — Evaluation (Signed)
Occupational Therapy Evaluation Patient Details Name: Destiny Day MRN: [id number] DOB: April 30, 1951 Today's Date: 10/08/2015    History of Present Illness 65 y.o. female with PMH of hypertension, hyperlipidemia, diabetes mellitus, asthma, GERD, anxiety, GI bleeding, diastolic congestive heart failure, chronic kidney disease-stage IV, anemia, and OSA. She was admitted for acute respiratory failure. In ED she presented with flash pulmonary edema due to hypertensive urgency, BP of 246/116.     Clinical Impression   Pt with multiple admissions starting 08/2014 every month for an entire year. OT to follow acutely for energy conservation and activity tolerance this admission. Pt completed full adl at sink level and reports extreme fatigue. Pt with multiple staff members in room this AM and pt very fatigued now after all the morning activity. Pt does not want HHOT due to extremely high co pay of 125 dollars.     Follow Up Recommendations  No OT follow up    Equipment Recommendations  None recommended by OT    Recommendations for Other Services       Precautions / Restrictions Precautions Precautions: Fall      Mobility Bed Mobility Overal bed mobility: Needs Assistance Bed Mobility: Supine to Sit     Supine to sit: HOB elevated;Supervision Sit to supine: HOB elevated;Supervision   General bed mobility comments: use of rail, verbal cues for sequencing. Pt leaning heavily on R side to push up  Transfers Overall transfer level: Needs assistance Equipment used: Rolling walker (2 wheeled) Transfers: Sit to/from Stand Sit to Stand: Min guard         General transfer comment: min guard for safety only    Balance                                            ADL Overall ADL's : Needs assistance/impaired     Grooming: Wash/dry face;Supervision/safety;Sitting;Oral care   Upper Body Bathing: Supervision/ safety;Sitting   Lower Body Bathing:  Supervison/ safety;Sitting/lateral leans           Toilet Transfer: Min guard;Ambulation           Functional mobility during ADLs: Minimal assistance General ADL Comments: Pt educated on energy conservation and provided handout . Pt demonstrates several EC techniques at baseline. Pt educated on laundry. pt currently climbing stairs kicking a draw string bag down the stairs then kicking it out to the garage. Pt advised to have family (A)     Vision     Perception     Praxis      Pertinent Vitals/Pain Pain Assessment: No/denies pain     Hand Dominance Right   Extremity/Trunk Assessment Upper Extremity Assessment Upper Extremity Assessment: LUE deficits/detail LUE Deficits / Details: hx L rotator cuff tear and neck pain on L side- pain and numbness from shoulder to fingers   Lower Extremity Assessment Lower Extremity Assessment: Defer to PT evaluation   Cervical / Trunk Assessment Cervical / Trunk Assessment: Other exceptions (neurosurg- bone loss in cervical recommending fusion) Cervical / Trunk Exceptions: pt with recommendation for surg but currently not having surg   Communication Communication Communication: No difficulties   Cognition Arousal/Alertness: Awake/alert Behavior During Therapy: WFL for tasks assessed/performed Overall Cognitive Status: Within Functional Limits for tasks assessed                     General Comments  Exercises       Shoulder Instructions      Home Living Family/patient expects to be discharged to:: Private residence Living Arrangements: Spouse/significant other Available Help at Discharge: Family;Available PRN/intermittently Type of Home: House Home Access: Stairs to enter [employer] of Steps: 6 Entrance Stairs-Rails: Left Home Layout: Two level;Bed/bath upstairs Alternate Level Stairs-Number of Steps: 7 Alternate Level Stairs-Rails: Left Bathroom Shower/Tub: Tub/shower unit;Curtain    Bathroom Toilet: Standard     Home Equipment: Environmental consultant - 4 wheels;Cane - single point;Shower seat          Prior Functioning/Environment Level of Independence: Independent with assistive device(s)        Comments: Pt has primarily been using cane.    OT Diagnosis: Generalized weakness   OT Problem List: Decreased strength;Decreased activity tolerance;Impaired balance (sitting and/or standing);Decreased safety awareness;Decreased knowledge of use of DME or AE;Cardiopulmonary status limiting activity   OT Treatment/Interventions: Self-care/ADL training;Therapeutic exercise;Energy conservation;DME and/or AE instruction;Therapeutic activities;Patient/family education;Balance training    OT Goals(Current goals can be found in the care plan section) Acute Rehab OT Goals Patient Stated Goal: get stronger OT Goal Formulation: With patient Time For Goal Achievement: 10/22/15 Potential to Achieve Goals: Good  OT Frequency: Min 2X/week   Barriers to D/C: Decreased caregiver support  husband works part time       [employer] Equipment Utilized During Treatment: Teacher, adult education Communication: Mobility status;Precautions  Activity Tolerance: Patient tolerated treatment well Patient left: in bed;with call bell/phone within reach   Time: 0933-1019 OT Time Calculation (min): 46 min Charges:  OT General Charges $OT Visit: 1 Procedure OT Evaluation $OT Eval Moderate Complexity: 1 Procedure OT Treatments $Self Care/Home Management : 23-37 mins G-Codes:    Parke Poisson B 07-Nov-2015, 11:07 AM   Jeri Modena   OTR/L PagerIP:[phone] Office: [phone] .

## 2015-10-08 NOTE — Care Management Note (Addendum)
Case Management Note  Patient Details  Name: EMPRYSS NEWNUM MRN: [id number] Date of Birth: [birth year]  Subjective/Objective:    Pt lives with spouse, has cane and rollator.  Discussed home health services and pt refuses, states her copay for home health services is $125 per visit and she can't afford to pay that amount.  PT recommends outpatient rehab and pt requests CM determine copay.  Per check by Crystal in billing, pt would need to pay $25 per visit.  Provided information to pt who states she prefers to return home with husband's assistance                          Expected Discharge Plan:  Home/Self Care  Discharge planning Services  CM Consult  Status of Service:  Completed, signed off  Jazel Nimmons, Kym Groom, RN 10/08/2015, 12:56 PM  AFTER TALKING WITH SPOUSE, PT HAS NOW DECIDED SHE WANTS TO [place].  REFERRAL IS ON SHADOW CHART FOR MD SIGNATURE.

## 2015-10-08 NOTE — Evaluation (Addendum)
Physical Therapy Evaluation Patient Details Name: Destiny Day MRN: [id number] DOB: 1951/01/18 Today's Date: 10/08/2015   History of Present Illness  65 y.o. female with PMH of hypertension, hyperlipidemia, diabetes mellitus, asthma, GERD, anxiety, GI bleeding, diastolic congestive heart failure, chronic kidney disease-stage IV, anemia, and OSA. She was admitted for acute respiratory failure. In ED she presented with flash pulmonary edema due to hypertensive urgency, BP of 246/116.    Clinical Impression  Pt admitted with above diagnosis. Pt currently with functional limitations due to the deficits listed below (see PT Problem List).  On eval, pt required min guard assist for transfers and gait using RW. HR remained in the 80s and O2 sats in the 90s on RA during mobility. Pt will benefit from skilled PT to increase their independence and safety with mobility to allow discharge to the venue listed below.  Recommending OPPT at d/c. Pt/spouse report HHPT was recommended after previous hospitalization in Dec. 2016. They declined services because their co-pay was too high. Pt/spouse agreeable to try OPPT, if visits are covered by insurance.     Follow Up Recommendations Outpatient PT;Supervision for mobility/OOB    Equipment Recommendations  None recommended by PT    Recommendations for Other Services       Precautions / Restrictions Precautions Precautions: Fall      Mobility  Bed Mobility Overal bed mobility: Needs Assistance Bed Mobility: Supine to Sit;Sit to Supine     Supine to sit: HOB elevated;Supervision Sit to supine: HOB elevated;Supervision   General bed mobility comments: use of rail, verbal cues for sequencing  Transfers Overall transfer level: Needs assistance Equipment used: Rolling walker (2 wheeled) Transfers: Sit to/from Stand Sit to Stand: Min guard         General transfer comment: min guard for safety only  Ambulation/Gait Ambulation/Gait  assistance: Min guard Ambulation Distance (Feet): 150 Feet Assistive device: Rolling walker (2 wheeled) Gait Pattern/deviations: Step-through pattern;Decreased stride length Gait velocity: very slow Gait velocity interpretation: Below normal speed for age/gender General Gait Details: steady gait  Stairs            Wheelchair Mobility    Modified Rankin (Stroke Patients Only)       Balance                                             Pertinent Vitals/Pain Pain Assessment: No/denies pain    Home Living Family/patient expects to be discharged to:: Private residence Living Arrangements: Spouse/significant other Available Help at Discharge: Family;Available PRN/intermittently Type of Home: House Home Access: Stairs to enter Entrance Stairs-Rails: Left Entrance Stairs-Number of Steps: 6 Home Layout: Two level;Bed/bath upstairs Home Equipment: Walker - 4 wheels;Cane - single point;Shower seat      Prior Function Level of Independence: Independent with assistive device(s)         Comments: Pt has primarily been using cane.     Hand Dominance   Dominant Hand: Right    Extremity/Trunk Assessment   Upper Extremity Assessment: Defer to OT evaluation           Lower Extremity Assessment: Generalized weakness      Cervical / Trunk Assessment: Normal  Communication   Communication: No difficulties  Cognition Arousal/Alertness: Awake/alert Behavior During Therapy: WFL for tasks assessed/performed Overall Cognitive Status: Within Functional Limits for tasks assessed  General Comments      Exercises        Assessment/Plan    PT Assessment Patient needs continued PT services  PT Diagnosis Generalized weakness;Difficulty walking   PT Problem List Decreased strength;Decreased activity tolerance;Decreased mobility;Cardiopulmonary status limiting activity;Decreased balance  PT Treatment Interventions DME  instruction;Gait training;Stair training;Functional mobility training;Therapeutic activities;Therapeutic exercise;Patient/family education;Balance training   PT Goals (Current goals can be found in the Care Plan section) Acute Rehab PT Goals Patient Stated Goal: get stronger PT Goal Formulation: With patient/family Time For Goal Achievement: 10/22/15 Potential to Achieve Goals: Good    Frequency Min 3X/week   Barriers to discharge        Co-evaluation               End of Session Equipment Utilized During Treatment: Gait belt Activity Tolerance: Patient tolerated treatment well Patient left: in bed;with call bell/phone within reach;with family/visitor present Nurse Communication: Mobility status         Time: [id number] PT Time Calculation (min) (ACUTE ONLY): 29 min   Charges:   PT Evaluation $PT Eval Moderate Complexity: 1 Procedure PT Treatments $Gait Training: 8-22 mins   PT G Codes:        Lorriane Shire 10/08/2015, 9:28 AM

## 2015-10-08 NOTE — Progress Notes (Signed)
MD paged concerning pt's cbgs and home SSI.  New orders will be placed, will continue to monitor pt.

## 2015-10-08 NOTE — Progress Notes (Signed)
Utilization review completed. Hanley Woerner, RN, BSN. 

## 2015-10-08 NOTE — Progress Notes (Signed)
[facility] PROGRESS NOTE  Destiny Day [id number] DOB: Jan 01, 1951 DOA: 10/06/2015 PCP: Velna Hatchet, MD Interim summary: 65 y.o. female with PMH of hypertension, hyperlipidemia, diabetes mellitus, asthma, GERD, anxiety, GI bleeding, diastolic congestive heart failure, chronic kidney disease-stage IV, anemia, OSA not on CPAP, who presents with shortness of breath, possibly from flash pulm edema from hypertensive urgency.   Assessment/Plan: 1. Acute respiratory failure with hypoxia probably from flash pulm edema  Sec to hypertensive urgency: - resolved. Able to take her off oxygen. Her initial CXR shows pulm edema. She was started on IV lasix, IV NTG gtt, . Her BP measurements normalized and NTG gtt was stopped. Repeat CXR showed resolved pulm edema.  2 d ECHO pending.  Daily weights and strint intake and output.  Resume home meds.    2. Questionable CAP: Started on IV antibiotics. repeat CXR shows resolution. Plan to change to po antibiotics in am.    3. Hypokalemia: repleted.   4. Hypertensive urgency: Resolved.   5. Acute on chronic diastolic heart failure: Much improved.   6. Diabetes mellitus: hgba1c ordered.  SSI.  Resume levemir.  CBG (last 3)   Recent Labs  10/08/15 0638 10/08/15 1207 10/08/15 1633  GLUCAP 182* 203* 296*    7. Asthma: no wheezing heard.  Resume dulera.   8. stage4 CKD: Creatinine is at baselin.  She missed her appt with Dr Lorrene Reid yesterday.  Outpatient follo wup with Dr Lorrene Reid.    Code Status: full code Family Communication: discussed the plan of care with pt and husband.  Disposition Plan: possibly tomorrow.    Consultants:  none  Procedures:  none  Antibiotics:  Rocephin and zithromax  HPI/Subjective: Reports breathing is good, no nausea or vomiting. Denies any chest pain.   Objective: Filed Vitals:   10/08/15 0926 10/08/15 1209  BP: 142/54 135/65  Pulse: 82 74  Temp: 98.1 F (36.7 C) 97.3 F (36.3  C)  Resp: 20 18    Intake/Output Summary (Last 24 hours) at 10/08/15 1830 Last data filed at 10/08/15 1808  Gross per 24 hour  Intake    480 ml  Output   2250 ml  Net  -1770 ml   Filed Weights   10/06/15 2253 10/07/15 1549 10/08/15 0612  Weight: 65.318 kg (144 lb) 64.683 kg (142 lb 9.6 oz) 64.411 kg (142 lb)    Exam:   General:  Alert afebrile comfortable.   Cardiovascular: s1s2  Respiratory: clear to auscultation, no wheezing or rhonchi  Abdomen:soft non tender non distended bowel sounds heard  Musculoskeletal: no pedal edema.  Data Reviewed: Basic Metabolic Panel:  Recent Labs Lab 10/06/15 2338 10/06/15 2348 10/07/15 0347 10/08/15 0427  NA 134* 136  --  135  K 3.1* 3.1*  --  3.9  CL 93* 94*  --  94*  CO2  --  26  --  28  GLUCOSE 255* 250*  --  232*  BUN 61* 59*  --  68*  CREATININE 3.70* 3.78*  --  4.15*  CALCIUM  --  9.2  --  9.1  MG  --   --  1.7  --    Liver Function Tests:  Recent Labs Lab 10/06/15 2348 10/08/15 0427  AST 76* 26  ALT 51 29  ALKPHOS 169* 126  BILITOT 0.8 0.6  PROT 7.5 6.7  ALBUMIN 3.4* 3.0*   No results for input(s): LIPASE, AMYLASE in the last 168 hours. No results for input(s): AMMONIA in the last 168 hours.  CBC:  Recent Labs Lab 10/06/15 2338 10/06/15 2348 10/08/15 0427  WBC  --  9.5 7.8  NEUTROABS  --  7.0  --   HGB 12.6 11.1* 9.3*  HCT 37.0 34.0* 28.9*  MCV  --  90.9 91.5  PLT  --  292 263   Cardiac Enzymes:  Recent Labs Lab 10/06/15 2348 10/07/15 0346 10/07/15 0735 10/07/15 1256  TROPONINI 0.04* 0.06* 0.08* 0.06*   BNP (last 3 results)  Recent Labs  03/15/15 0225 06/30/15 2043 10/06/15 2348  BNP 1025.4* 1532.5* 799.3*    ProBNP (last 3 results) No results for input(s): PROBNP in the last 8760 hours.  CBG:  Recent Labs Lab 10/07/15 2056 10/08/15 0309 10/08/15 [id number] 10/08/15 1207 10/08/15 1633  GLUCAP 178* 189* 182* 203* 296*    Recent Results (from the past 240 hour(s))  MRSA  PCR Screening     Status: None   Collection Time: 10/07/15  6:09 PM  Result Value Ref Range Status   MRSA by PCR NEGATIVE NEGATIVE Final    Comment:        The GeneXpert MRSA Assay (FDA approved for NASAL specimens only), is one component of a comprehensive MRSA colonization surveillance program. It is not intended to diagnose MRSA infection nor to guide or monitor treatment for MRSA infections.      Studies: Dg Chest 2 View  10/08/2015  CLINICAL DATA:  65 year old female admitted for acute shortness of breath. Initial encounter. EXAM: CHEST  2 VIEW COMPARISON:  Portable chest 10/06/2015 and earlier. FINDINGS: Significantly regressed bilateral indistinct and basilar predominant pulmonary opacity suggesting regression of pulmonary edema. Pulmonary vascularity now appears near baseline. Small bilateral pleural effusions remain. Stable mild cardiomegaly. Other mediastinal contours are within normal limits. Visualized tracheal air column is within normal limits. No pneumothorax. No acute osseous abnormality identified. Widespread calcified aortic atherosclerosis. IMPRESSION: Virtually resolved pulmonary edema. Small bilateral pleural effusions persist. Electronically Signed   By: Genevie Ann M.D.   On: 10/08/2015 14:30   Dg Chest Port 1 View  10/07/2015  CLINICAL DATA:  Acute onset of dyspnea.  Initial encounter. EXAM: PORTABLE CHEST 1 VIEW COMPARISON:  Chest radiograph performed 06/30/2015 FINDINGS: The lungs are well-aerated. Bilateral airspace opacification, more prominent on the right, may reflect pulmonary edema or pneumonia. No definite pleural effusion or pneumothorax is seen. The cardiomediastinal silhouette is borderline enlarged. No acute osseous abnormalities are seen. IMPRESSION: Bilateral airspace opacification, more prominent on the right, may reflect pulmonary edema or pneumonia. Borderline cardiomegaly. Electronically Signed   By: Garald Balding M.D.   On: 10/07/2015 00:04     Scheduled Meds: . amLODipine  2.5 mg Oral Daily  . aspirin  81 mg Oral Daily  . atorvastatin  20 mg Oral q1800  . azithromycin  500 mg Intravenous Q24H  . calcitRIOL  0.25 mcg Oral Daily  . carvedilol  25 mg Oral BID WC  . cefTRIAXone (ROCEPHIN)  IV  1 g Intravenous Q24H  . clotrimazole  1 application Topical BID  . dextromethorphan-guaiFENesin  1 tablet Oral BID  . ferrous sulfate  325 mg Oral Q breakfast  . furosemide  20 mg Intravenous Once  . gabapentin  300 mg Oral Daily  . heparin  5,000 Units Subcutaneous 3 times per day  . [START ON 10/09/2015] insulin aspart  0-9 Units Subcutaneous TID WC  . insulin detemir  8 Units Subcutaneous QHS  . isosorbide-hydrALAZINE  1 tablet Oral BID  . mometasone-formoterol  2 puff Inhalation BID  .  multivitamin  1 tablet Oral Daily  . pantoprazole  40 mg Oral Daily  . potassium chloride  40 mEq Oral BID  . sodium chloride flush  3 mL Intravenous Q12H   Continuous Infusions:   Principal Problem:   Acute respiratory failure (HCC) Active Problems:   HLD (hyperlipidemia)   Asthma   Diastolic CHF, acute on chronic (HCC)   Hypertensive urgency   Pulmonary edema   Type 2 diabetes mellitus with diabetic nephropathy (HCC)   CKD (chronic kidney disease) stage 4, GFR 15-29 ml/min (HCC)   Acute pulmonary edema (HCC)   GERD (gastroesophageal reflux disease)    Time spent: 35 minutes.     [facility] Pager [phone]. If 7PM-7AM, please contact night-coverage at [email or web address], password [facility] 10/08/2015, 6:30 PM  LOS: 1 day

## 2015-10-09 ENCOUNTER — Inpatient Hospital Stay (HOSPITAL_COMMUNITY): Payer: BC Managed Care – PPO

## 2015-10-09 ENCOUNTER — Encounter (HOSPITAL_COMMUNITY): Payer: Self-pay | Admitting: *Deleted

## 2015-10-09 ENCOUNTER — Emergency Department (HOSPITAL_COMMUNITY): Payer: BC Managed Care – PPO

## 2015-10-09 ENCOUNTER — Inpatient Hospital Stay (HOSPITAL_COMMUNITY)
Admission: EM | Admit: 2015-10-09 | Discharge: 2015-10-15 | Disposition: A | Discharge status: Home / Self Care | Payer: BC Managed Care – PPO | Source: Home / Self Care | Attending: Internal Medicine | Admitting: Internal Medicine

## 2015-10-09 DIAGNOSIS — J811 Chronic pulmonary edema: Secondary | ICD-10-CM | POA: Diagnosis present

## 2015-10-09 DIAGNOSIS — E1121 Type 2 diabetes mellitus with diabetic nephropathy: Secondary | ICD-10-CM | POA: Diagnosis present

## 2015-10-09 DIAGNOSIS — N184 Chronic kidney disease, stage 4 (severe): Secondary | ICD-10-CM | POA: Diagnosis present

## 2015-10-09 DIAGNOSIS — J810 Acute pulmonary edema: Secondary | ICD-10-CM

## 2015-10-09 DIAGNOSIS — R05 Cough: Secondary | ICD-10-CM

## 2015-10-09 DIAGNOSIS — R059 Cough, unspecified: Secondary | ICD-10-CM

## 2015-10-09 DIAGNOSIS — E1122 Type 2 diabetes mellitus with diabetic chronic kidney disease: Secondary | ICD-10-CM | POA: Diagnosis present

## 2015-10-09 DIAGNOSIS — I160 Hypertensive urgency: Secondary | ICD-10-CM | POA: Diagnosis present

## 2015-10-09 DIAGNOSIS — I509 Heart failure, unspecified: Secondary | ICD-10-CM

## 2015-10-09 DIAGNOSIS — I161 Hypertensive emergency: Secondary | ICD-10-CM

## 2015-10-09 LAB — CBC WITH DIFFERENTIAL/PLATELET
Basophils Absolute: 0 10*3/uL (ref 0.0–0.1)
Basophils Relative: 0 %
Eosinophils Absolute: 0.3 10*3/uL (ref 0.0–0.7)
Eosinophils Relative: 4 %
HCT: 31.6 % — ABNORMAL LOW (ref 36.0–46.0)
Hemoglobin: 10.3 g/dL — ABNORMAL LOW (ref 12.0–15.0)
Lymphocytes Relative: 29 %
Lymphs Abs: 2.2 10*3/uL (ref 0.7–4.0)
MCH: 30.3 pg (ref 26.0–34.0)
MCHC: 32.6 g/dL (ref 30.0–36.0)
MCV: 92.9 fL (ref 78.0–100.0)
Monocytes Absolute: 0.4 10*3/uL (ref 0.1–1.0)
Monocytes Relative: 5 %
Neutro Abs: 4.7 10*3/uL (ref 1.7–7.7)
Neutrophils Relative %: 62 %
Platelets: 287 10*3/uL (ref 150–400)
RBC: 3.4 MIL/uL — ABNORMAL LOW (ref 3.87–5.11)
RDW: 12.2 % (ref 11.5–15.5)
WBC: 7.6 10*3/uL (ref 4.0–10.5)

## 2015-10-09 LAB — COMPREHENSIVE METABOLIC PANEL
ALT: 25 U/L (ref 14–54)
AST: 27 U/L (ref 15–41)
Albumin: 3.3 g/dL — ABNORMAL LOW (ref 3.5–5.0)
Alkaline Phosphatase: 137 U/L — ABNORMAL HIGH (ref 38–126)
Anion gap: 14 (ref 5–15)
BUN: 68 mg/dL — ABNORMAL HIGH (ref 6–20)
CO2: 25 mmol/L (ref 22–32)
Calcium: 9.5 mg/dL (ref 8.9–10.3)
Chloride: 93 mmol/L — ABNORMAL LOW (ref 101–111)
Creatinine, Ser: 4.18 mg/dL — ABNORMAL HIGH (ref 0.44–1.00)
GFR calc Af Amer: 12 mL/min — ABNORMAL LOW (ref >60)
GFR calc non Af Amer: 10 mL/min — ABNORMAL LOW (ref >60)
Glucose, Bld: 206 mg/dL — ABNORMAL HIGH (ref 65–99)
Potassium: 5 mmol/L (ref 3.5–5.1)
Sodium: 132 mmol/L — ABNORMAL LOW (ref 135–145)
Total Bilirubin: 0.6 mg/dL (ref 0.3–1.2)
Total Protein: 7.5 g/dL (ref 6.5–8.1)

## 2015-10-09 LAB — ECHOCARDIOGRAM COMPLETE
Height: 64 in
Weight: 2310.4 oz

## 2015-10-09 LAB — GLUCOSE, CAPILLARY
Glucose-Capillary: 232 mg/dL — ABNORMAL HIGH (ref 65–99)
Glucose-Capillary: 202 mg/dL — ABNORMAL HIGH (ref 65–99)

## 2015-10-09 LAB — I-STAT ARTERIAL BLOOD GAS, ED
Acid-Base Excess: 4 mmol/L — ABNORMAL HIGH (ref 0.0–2.0)
Bicarbonate: 29.7 mEq/L — ABNORMAL HIGH (ref 20.0–24.0)
O2 Saturation: 100 %
Patient temperature: 98
TCO2: 31 mmol/L (ref 0–100)
pCO2 arterial: 49.3 mmHg — ABNORMAL HIGH (ref 35.0–45.0)
pH, Arterial: 7.387 (ref 7.350–7.450)
pO2, Arterial: 183 mmHg — ABNORMAL HIGH (ref 80.0–100.0)

## 2015-10-09 LAB — I-STAT TROPONIN, ED
Troponin i, poc: 0.04 ng/mL (ref 0.00–0.08)
Troponin i, poc: 0.04 ng/mL (ref 0.00–0.08)

## 2015-10-09 LAB — I-STAT CG4 LACTIC ACID, ED
Lactic Acid, Venous: 0.86 mmol/L (ref 0.5–2.0)
Lactic Acid, Venous: 1.25 mmol/L (ref 0.5–2.0)

## 2015-10-09 LAB — BRAIN NATRIURETIC PEPTIDE: B Natriuretic Peptide: 966.4 pg/mL — ABNORMAL HIGH (ref 0.0–100.0)

## 2015-10-09 MED ORDER — ACETAMINOPHEN 325 MG PO TABS: 650.0000 mg | ORAL_TABLET | Freq: Four times a day (QID) | ORAL | Status: DC | PRN

## 2015-10-09 MED ORDER — LORATADINE 10 MG PO TABS: 10.0000 mg | ORAL_TABLET | Freq: Every day | ORAL | Status: DC | PRN

## 2015-10-09 MED ORDER — CEFUROXIME AXETIL 500 MG PO TABS
250.0000 mg | ORAL_TABLET | Freq: Two times a day (BID) | ORAL | Status: DC
  Administered 2015-10-09: 250 mg via ORAL
  Filled 2015-10-09: qty 1

## 2015-10-09 MED ORDER — FERROUS SULFATE 325 (65 FE) MG PO TABS
325.0000 mg | ORAL_TABLET | Freq: Every day | ORAL | Status: DC
  Administered 2015-10-10 – 2015-10-15 (×6): 325 mg via ORAL
  Filled 2015-10-09 (×6): qty 1

## 2015-10-09 MED ORDER — NITROGLYCERIN IN D5W 200-5 MCG/ML-% IV SOLN
0.0000 ug/min | Freq: Once | INTRAVENOUS | Status: AC
  Administered 2015-10-10: 35 ug/min via INTRAVENOUS

## 2015-10-09 MED ORDER — ONDANSETRON HCL 4 MG PO TABS: 4.0000 mg | ORAL_TABLET | Freq: Four times a day (QID) | ORAL | Status: DC | PRN

## 2015-10-09 MED ORDER — PANTOPRAZOLE SODIUM 40 MG PO TBEC
40.0000 mg | DELAYED_RELEASE_TABLET | Freq: Every day | ORAL | Status: DC
  Administered 2015-10-10 – 2015-10-15 (×6): 40 mg via ORAL
  Filled 2015-10-09 (×6): qty 1

## 2015-10-09 MED ORDER — NITROGLYCERIN IN D5W 200-5 MCG/ML-% IV SOLN
0.0000 ug/min | Freq: Once | INTRAVENOUS | Status: AC
  Administered 2015-10-09: 5 ug/min via INTRAVENOUS
  Filled 2015-10-09: qty 250

## 2015-10-09 MED ORDER — INSULIN ASPART 100 UNIT/ML ~~LOC~~ SOLN
0.0000 [IU] | Freq: Three times a day (TID) | SUBCUTANEOUS | Status: DC
  Administered 2015-10-10 – 2015-10-12 (×4): 3 [IU] via SUBCUTANEOUS
  Administered 2015-10-13: 2 [IU] via SUBCUTANEOUS
  Administered 2015-10-13: 3 [IU] via SUBCUTANEOUS
  Administered 2015-10-13: 1 [IU] via SUBCUTANEOUS
  Administered 2015-10-14: 3 [IU] via SUBCUTANEOUS
  Administered 2015-10-14 – 2015-10-15 (×2): 1 [IU] via SUBCUTANEOUS

## 2015-10-09 MED ORDER — FUROSEMIDE 10 MG/ML IJ SOLN
60.0000 mg | Freq: Two times a day (BID) | INTRAMUSCULAR | Status: DC
  Administered 2015-10-10: 60 mg via INTRAVENOUS
  Filled 2015-10-09: qty 6

## 2015-10-09 MED ORDER — CEFUROXIME AXETIL 250 MG PO TABS: 250.0000 mg | ORAL_TABLET | Freq: Two times a day (BID) | ORAL | Status: DC

## 2015-10-09 MED ORDER — CEFUROXIME AXETIL 250 MG PO TABS
250.0000 mg | ORAL_TABLET | Freq: Two times a day (BID) | ORAL | Status: DC
  Administered 2015-10-10 – 2015-10-11 (×3): 250 mg via ORAL
  Filled 2015-10-09 (×4): qty 1

## 2015-10-09 MED ORDER — AZITHROMYCIN 250 MG PO TABS
250.0000 mg | ORAL_TABLET | Freq: Every day | ORAL | Status: DC
  Administered 2015-10-10 – 2015-10-11 (×2): 250 mg via ORAL
  Filled 2015-10-09 (×3): qty 1

## 2015-10-09 MED ORDER — ATORVASTATIN CALCIUM 20 MG PO TABS
20.0000 mg | ORAL_TABLET | Freq: Every day | ORAL | Status: DC
  Administered 2015-10-10 – 2015-10-14 (×5): 20 mg via ORAL
  Filled 2015-10-09 (×5): qty 1

## 2015-10-09 MED ORDER — POTASSIUM CHLORIDE CRYS ER 20 MEQ PO TBCR
20.0000 meq | EXTENDED_RELEASE_TABLET | Freq: Two times a day (BID) | ORAL | Status: DC
  Administered 2015-10-10 – 2015-10-12 (×6): 20 meq via ORAL
  Filled 2015-10-09 (×6): qty 1

## 2015-10-09 MED ORDER — MOMETASONE FURO-FORMOTEROL FUM 200-5 MCG/ACT IN AERO
2.0000 | INHALATION_SPRAY | Freq: Two times a day (BID) | RESPIRATORY_TRACT | Status: DC
  Administered 2015-10-11 – 2015-10-15 (×6): 2 via RESPIRATORY_TRACT
  Filled 2015-10-09 (×2): qty 8.8

## 2015-10-09 MED ORDER — AMLODIPINE BESYLATE 5 MG PO TABS: 2.5000 mg | ORAL_TABLET | Freq: Every day | ORAL | Status: DC

## 2015-10-09 MED ORDER — CALCITRIOL 0.25 MCG PO CAPS
0.2500 ug | ORAL_CAPSULE | Freq: Every day | ORAL | Status: DC
  Administered 2015-10-10 – 2015-10-12 (×3): 0.25 ug via ORAL
  Filled 2015-10-09 (×5): qty 1

## 2015-10-09 MED ORDER — AZITHROMYCIN 250 MG PO TABS
250.0000 mg | ORAL_TABLET | Freq: Every day | ORAL | Status: DC
  Administered 2015-10-09: 250 mg via ORAL
  Filled 2015-10-09: qty 1

## 2015-10-09 MED ORDER — ASPIRIN 81 MG PO CHEW
81.0000 mg | CHEWABLE_TABLET | Freq: Every day | ORAL | Status: DC
  Administered 2015-10-10 – 2015-10-15 (×6): 81 mg via ORAL
  Filled 2015-10-09 (×6): qty 1

## 2015-10-09 MED ORDER — ALBUTEROL SULFATE HFA 108 (90 BASE) MCG/ACT IN AERS: 1.0000 | INHALATION_SPRAY | Freq: Four times a day (QID) | RESPIRATORY_TRACT | Status: DC | PRN

## 2015-10-09 MED ORDER — HEPARIN SODIUM (PORCINE) 5000 UNIT/ML IJ SOLN
5000.0000 [IU] | Freq: Three times a day (TID) | INTRAMUSCULAR | Status: DC
  Administered 2015-10-10 – 2015-10-15 (×11): 5000 [IU] via SUBCUTANEOUS
  Filled 2015-10-09 (×10): qty 1

## 2015-10-09 MED ORDER — INSULIN DETEMIR 100 UNIT/ML FLEXPEN: 10.0000 [IU] | PEN_INJECTOR | Freq: Every day | SUBCUTANEOUS | Status: DC

## 2015-10-09 MED ORDER — FUROSEMIDE 10 MG/ML IJ SOLN
40.0000 mg | Freq: Once | INTRAMUSCULAR | Status: AC
  Administered 2015-10-09: 40 mg via INTRAVENOUS
  Filled 2015-10-09: qty 4

## 2015-10-09 MED ORDER — AZITHROMYCIN 250 MG PO TABS: ORAL_TABLET | ORAL | Status: DC

## 2015-10-09 MED ORDER — ISOSORB DINITRATE-HYDRALAZINE 20-37.5 MG PO TABS: 1.0000 | ORAL_TABLET | Freq: Two times a day (BID) | ORAL | Status: DC

## 2015-10-09 MED ORDER — CARVEDILOL 25 MG PO TABS
25.0000 mg | ORAL_TABLET | Freq: Two times a day (BID) | ORAL | Status: DC
  Administered 2015-10-10 – 2015-10-15 (×8): 25 mg via ORAL
  Filled 2015-10-09 (×10): qty 1

## 2015-10-09 MED ORDER — RENA-VITE PO TABS
1.0000 | ORAL_TABLET | Freq: Every day | ORAL | Status: DC
  Administered 2015-10-10 – 2015-10-15 (×6): 1 via ORAL
  Filled 2015-10-09 (×9): qty 1

## 2015-10-09 MED ORDER — ACETAMINOPHEN 650 MG RE SUPP: 650.0000 mg | Freq: Four times a day (QID) | RECTAL | Status: DC | PRN

## 2015-10-09 MED ORDER — METOLAZONE 2.5 MG PO TABS: 2.5000 mg | ORAL_TABLET | ORAL | Status: DC

## 2015-10-09 MED ORDER — ONDANSETRON HCL 4 MG/2ML IJ SOLN: 4.0000 mg | Freq: Four times a day (QID) | INTRAMUSCULAR | Status: DC | PRN

## 2015-10-09 NOTE — ED Notes (Signed)
The pt is c/o being sob  She was just discahrged this afternoon from this hospital.  She has a fistula in her rt arm that has not been used just yet

## 2015-10-09 NOTE — H&P (Signed)
[facility] History and Physical  ANTONIA STANSBERRY [id number] DOB: January 24, 1951 DOA: 10/09/2015  Referring physician: Dr. Adela Glimpse. PCP: Velna Hatchet, MD  Specialists: Nephrologist.  Chief Complaint: Shortness of breath.  HPI: Destiny Day is a 65 y.o. female was discharged home yesterday after being treated for acute pulmonary edema persists back to the ER after developing sudden onset of shortness of breath after reaching home. In the ER patient was found to have systolic blood pressure more than 200 with chest x-ray showing congestion. Patient still has some productive cough but denies any chest pain. Patient was afebrile. Patient was given Lasix 40 mg with nitroglycerin infusion for acute pulmonary edema. Patient will be admitted for respiratory failure secondary to acute pulmonary edema and hypertensive urgency. During last admission patient's 2-D echo showed grade 2 diastolic dysfunction with EF of 55-60%.  Review of Systems: As presented in the history of presenting illness, rest negative.  Past Medical History  Diagnosis Date  . Hypertension   . Diverticulitis   . Hyperlipidemia   . Asthma   . Diabetes mellitus     insulin dependent  . Arthritis     knee  . GI bleed 03/31/2013  . CHF (congestive heart failure) ([place])   . Osteoporosis   . Seasonal allergies   . Anxiety   . Abdominal bruit   . Chronic kidney disease     stage 3  . Sleep apnea     can't afford cpap  . Pneumonia   . GERD (gastroesophageal reflux disease)     from medications  . Complication of anesthesia     " after I got home from my last procedure, I started itching."  . Anemia    Past Surgical History  Procedure Laterality Date  . Abdominal hysterectomy  1993`  . Esophagogastroduodenoscopy N/A 03/31/2013    Procedure: ESOPHAGOGASTRODUODENOSCOPY (EGD);  Surgeon: Gatha Mayer, MD;  Location: [facility] ENDOSCOPY;  Service: Endoscopy;  Laterality: N/A;  . Eye surgery      laser surgery  .  Bascilic vein transposition Left 07/10/2014    Procedure: BASCILIC VEIN TRANSPOSITION;  Surgeon: Angelia Mould, MD;  Location: [place];  Service: Vascular;  Laterality: Left;  . Fistulogram Left 10/29/2014    Procedure: FISTULOGRAM;  Surgeon: Angelia Mould, MD;  Location: [facility] CATH LAB;  Service: Cardiovascular;  Laterality: Left;  . Bascilic vein transposition Right 11/08/2014    Procedure: FIRST STAGE BASILIC VEIN TRANSPOSITION;  Surgeon: Angelia Mould, MD;  Location: [place];  Service: Vascular;  Laterality: Right;  . Bascilic vein transposition Right 01/18/2015    Procedure: SECOND STAGE BASILIC VEIN TRANSPOSITION;  Surgeon: Angelia Mould, MD;  Location: [place];  Service: Vascular;  Laterality: Right;  . Patch angioplasty Right 01/18/2015    Procedure: BASILIC VEIN PATCH ANGIOPLASTY USING VASCUGUARD PATCH;  Surgeon: Angelia Mould, MD;  Location: [place];  Service: Vascular;  Laterality: Right;   Social History:  reports that she quit smoking about 3 years ago. Her smoking use included Cigarettes. She has a 14 pack-year smoking history. She has never used smokeless tobacco. She reports that she uses illicit drugs. She reports that she does not drink alcohol. Where does patient live Home. Can patient participate in ADLs? Yes.  Allergies  Allergen Reactions  . Lisinopril Cough  . Prednisone Other (See Comments)    Excessive fluid buildup    Family History:  Family History  Problem Relation Age of Onset  . Colon cancer Neg  Hx   . Other Mother     not sure of cause of death  . Diabetes Father   . Pancreatic cancer Maternal Grandmother       Prior to Admission medications   Medication Sig Start Date End Date Taking? Authorizing Provider  albuterol (PROVENTIL HFA;VENTOLIN HFA) 108 (90 BASE) MCG/ACT inhaler Inhale 1-2 puffs into the lungs every 6 (six) hours as needed for wheezing or shortness of breath.   Yes Historical Provider, MD  amLODipine (NORVASC) 2.5  MG tablet Take 1 tablet (2.5 mg total) by mouth daily. 07/03/15  Yes Shanker Kristeen Mans, MD  aspirin 81 MG chewable tablet Chew 81 mg by mouth daily.   Yes Historical Provider, MD  atorvastatin (LIPITOR) 20 MG tablet Take 20 mg by mouth daily at 6 PM.   Yes Historical Provider, MD  azithromycin (ZITHROMAX) 250 MG tablet 1 tab po qday x 3 days, zero refills 10/09/15  Yes Donne Hazel, MD  budesonide-formoterol [facility]) 160-4.5 MCG/ACT inhaler Inhale 1 puff into the lungs every evening.   Yes Historical Provider, MD  calcitRIOL (ROCALTROL) 0.25 MCG capsule Take 0.25 mcg by mouth daily.   Yes Historical Provider, MD  carvedilol (COREG) 12.5 MG tablet Take 2 tablets (25 mg total) by mouth 2 (two) times daily with a meal. 07/03/15  Yes Shanker Kristeen Mans, MD  cefUROXime (CEFTIN) 250 MG tablet Take 1 tablet (250 mg total) by mouth 2 (two) times daily with a meal. 10/09/15  Yes Donne Hazel, MD  ferrous sulfate 325 (65 FE) MG tablet Take 325 mg by mouth daily with breakfast.   Yes Historical Provider, MD  insulin aspart (NOVOLOG) 100 UNIT/ML injection Inject 0-8 Units into the skin 3 (three) times daily before meals. Sliding scale   Yes Historical Provider, MD  isosorbide-hydrALAZINE (BIDIL) 20-37.5 MG tablet Take 1 tablet by mouth 2 (two) times daily. 07/03/15  Yes Shanker Kristeen Mans, MD  LEVEMIR FLEXTOUCH 100 UNIT/ML Pen Inject 10 Units into the muscle at bedtime.  01/16/15  Yes Historical Provider, MD  loratadine (CLARITIN) 10 MG tablet Take 10 mg by mouth daily as needed for allergies.   Yes Historical Provider, MD  metolazone (ZAROXOLYN) 2.5 MG tablet Take 1 tablet (2.5 mg total) by mouth every 3 (three) days. 07/03/15  Yes Shanker Kristeen Mans, MD  multivitamin (RENA-VIT) TABS tablet Take 1 tablet by mouth daily. 01/24/15  Yes Historical Provider, MD  omeprazole (PRILOSEC) 20 MG capsule Take 20 mg by mouth daily.   Yes Historical Provider, MD  potassium chloride SA (K-DUR,KLOR-CON) 20 MEQ tablet Take 1  tablet (20 mEq total) by mouth 2 (two) times daily. 06/17/15  Yes Thurnell Lose, MD  torsemide (DEMADEX) 100 MG tablet Take 1 tablet (100 mg total) by mouth daily. 06/17/15  Yes Thurnell Lose, MD    Physical Exam: Filed Vitals:   10/09/15 2130 10/09/15 2200 10/09/15 2230 10/09/15 2245  BP: 195/84 166/86 196/86 190/83  Pulse: 79 74 73 74  Temp:      TempSrc:      Resp: 18 16 22 15   Height:      SpO2: 99% 100% 99% 100%     General:  Moderately built and nourished.  Eyes: Anicteric no pallor.  ENT: No discharge from the ears eyes nose or mouth.  Neck: No mass felt. JVD elevated.  Cardiovascular: S1 and S2 heard.  Respiratory: No rhonchi or crepitations.  Abdomen: Soft nontender bowel sounds present.  Skin: No rash.  Musculoskeletal: No edema.  Psychiatric: Appears normal.  Neurologic: Alert awake oriented to time place and person. Moves all extremities.  Labs on Admission:  Basic Metabolic Panel:  Recent Labs Lab 10/06/15 2338 10/06/15 2348 10/07/15 0347 10/08/15 0427 10/09/15 1952  NA 134* 136  --  135 132*  K 3.1* 3.1*  --  3.9 5.0  CL 93* 94*  --  94* 93*  CO2  --  26  --  28 25  GLUCOSE 255* 250*  --  232* 206*  BUN 61* 59*  --  68* 68*  CREATININE 3.70* 3.78*  --  4.15* 4.18*  CALCIUM  --  9.2  --  9.1 9.5  MG  --   --  1.7  --   --    Liver Function Tests:  Recent Labs Lab 10/06/15 2348 10/08/15 0427 10/09/15 1952  AST 76* 26 27  ALT 51 29 25  ALKPHOS 169* 126 137*  BILITOT 0.8 0.6 0.6  PROT 7.5 6.7 7.5  ALBUMIN 3.4* 3.0* 3.3*   No results for input(s): LIPASE, AMYLASE in the last 168 hours. No results for input(s): AMMONIA in the last 168 hours. CBC:  Recent Labs Lab 10/06/15 2338 10/06/15 2348 10/08/15 0427 10/09/15 1952  WBC  --  9.5 7.8 7.6  NEUTROABS  --  7.0  --  4.7  HGB 12.6 11.1* 9.3* 10.3*  HCT 37.0 34.0* 28.9* 31.6*  MCV  --  90.9 91.5 92.9  PLT  --  292 263 287   Cardiac Enzymes:  Recent Labs Lab  10/06/15 2348 10/07/15 0346 10/07/15 0735 10/07/15 1256  TROPONINI 0.04* 0.06* 0.08* 0.06*    BNP (last 3 results)  Recent Labs  06/30/15 2043 10/06/15 2348 10/09/15 1952  BNP 1532.5* 799.3* 966.4*    ProBNP (last 3 results) No results for input(s): PROBNP in the last 8760 hours.  CBG:  Recent Labs Lab 10/08/15 1207 10/08/15 1633 10/08/15 2121 10/09/15 0555 10/09/15 1224  GLUCAP 203* 296* 253* 232* 202*    Radiological Exams on Admission: Dg Chest 2 View  10/08/2015  CLINICAL DATA:  65 year old female admitted for acute shortness of breath. Initial encounter. EXAM: CHEST  2 VIEW COMPARISON:  Portable chest 10/06/2015 and earlier. FINDINGS: Significantly regressed bilateral indistinct and basilar predominant pulmonary opacity suggesting regression of pulmonary edema. Pulmonary vascularity now appears near baseline. Small bilateral pleural effusions remain. Stable mild cardiomegaly. Other mediastinal contours are within normal limits. Visualized tracheal air column is within normal limits. No pneumothorax. No acute osseous abnormality identified. Widespread calcified aortic atherosclerosis. IMPRESSION: Virtually resolved pulmonary edema. Small bilateral pleural effusions persist. Electronically Signed   By: Genevie Ann M.D.   On: 10/08/2015 14:30   Dg Chest Port 1 View  10/09/2015  CLINICAL DATA:  Shortness of breath for 1 day EXAM: PORTABLE CHEST 1 VIEW COMPARISON:  10/08/2015 FINDINGS: Cardiac shadow is stable. Mild increased vascular congestion is again noted with interstitial edema. This has increased in the interval from the prior exam. Small pleural effusions are noted. No bony abnormality is seen. IMPRESSION: Increase in vascular congestion and interstitial edema. Electronically Signed   By: Inez Catalina M.D.   On: 10/09/2015 20:24    EKG: Independently reviewed. Sinus tachycardia.  Assessment/Plan Active Problems:   Hypertensive urgency   Pulmonary edema   Type 2  diabetes mellitus with diabetic nephropathy (HCC)   CKD (chronic kidney disease) stage 4, GFR 15-29 ml/min (HCC)   Acute pulmonary edema ([place])   1. Acute  pulmonary edema - patient has been placed on IV nitroglycerin infusion and Lasix 60 mg IV every 12 hourly. Patient's recent 2-D echo done yesterday showed EF of 55-60% with grade 2 diastolic dysfunction. Cycle cardiac markers. Closely follow blood pressure trends. Continue antihypertensives. 2. Hypertensive urgency probably contributing to #1 - see #1. 3. Diabetes mellitus type 2 - on Levemir 10 units at bedtime. I have placed patient on sliding scale coverage. 4. 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 - patient does have a AV fistula. Patient probably will need nephrology will soon and may discuss with nephrologist in a.m. as patient was just readmitted again. 5. Chronic anemia from CK D - follow CBC. 6. Patient was recently admitted and was placed on antibiotics for possible pneumonia on the course of which patient still has 3 more days of antibiotics.   DVT Prophylaxis heparin.  Code Status: Full code.  Family Communication: Discussed with patient's husband.  Disposition Plan: Admit to inpatient.    KAKRAKANDY,ARSHAD N. [facility] Pager [phone].  If 7PM-7AM, please contact night-coverage [email or web address] Password [facility] 10/09/2015, 11:25 PM

## 2015-10-09 NOTE — Progress Notes (Signed)
Echocardiogram 2D Echocardiogram has been performed.  Destiny Day 10/09/2015, 10:55 AM

## 2015-10-09 NOTE — ED Notes (Signed)
The pt has been more relaxed for the past 30 minutes  bp coming down slwoly.  Per edp  Rate of nitro drip increased to 20 mcg/min at a rate of 56ml

## 2015-10-09 NOTE — Care Management Note (Signed)
Case Management Note  Patient Details  Name: Destiny Day MRN: [id number] Date of Birth: [birth year]  Subjective/Objective:                    Action/Plan:Faxed referral for Outpatient Physical Therapy @ [address] [place], [place] [address]     Expected Discharge Date:                  Expected Discharge Plan:  Home/Self Care  In-House Referral:     Discharge planning Services  CM Consult  Post Acute Care Choice:    Choice offered to:     DME Arranged:    DME Agency:     HH Arranged:    Energy Agency:     Status of Service:  Completed, signed off  Medicare Important Message Given:    Date Medicare IM Given:    Medicare IM give by:    Date Additional Medicare IM Given:    Additional Medicare Important Message give by:     If discussed at [place] of Stay Meetings, dates discussed:    Additional Comments:  Delrae Sawyers, RN 10/09/2015, 4:39 PM

## 2015-10-09 NOTE — Progress Notes (Signed)
Physical Therapy Treatment Patient Details Name: Destiny Day MRN: [id number] DOB: November 22, 1950 Today's Date: 10/09/2015    History of Present Illness 65 y.o. female with PMH of hypertension, hyperlipidemia, diabetes mellitus, asthma, GERD, anxiety, GI bleeding, diastolic congestive heart failure, chronic kidney disease-stage IV, anemia, and OSA. She was admitted for acute respiratory failure. In ED she presented with flash pulmonary edema due to hypertensive urgency, BP of 246/116.      PT Comments    Pt admitted with above diagnosis. Pt currently with functional limitations due to balance and endurance deficits.  Pt ambulates with RW with good safety overall with no LOB with RW.  Should progress well and Outpt PT can challenge pt to return pt to baseline.  Pt will benefit from skilled PT to increase their independence and safety with mobility to allow discharge to the venue listed below.    Follow Up Recommendations  Outpatient PT;Supervision for mobility/OOB     Equipment Recommendations  None recommended by PT    Recommendations for Other Services       Precautions / Restrictions Precautions Precautions: Fall Restrictions Weight Bearing Restrictions: No    Mobility  Bed Mobility Overal bed mobility: Needs Assistance Bed Mobility: Supine to Sit     Supine to sit: HOB elevated;Supervision     General bed mobility comments: use of rail, verbal cues for sequencing.   Transfers Overall transfer level: Needs assistance Equipment used: Rolling walker (2 wheeled) Transfers: Sit to/from Stand Sit to Stand: Supervision         General transfer comment: no assist needed.  Ambulation/Gait Ambulation/Gait assistance: Supervision Ambulation Distance (Feet): 200 Feet Assistive device: Rolling walker (2 wheeled) Gait Pattern/deviations: Step-through pattern;Decreased stride length   Gait velocity interpretation: Below normal speed for age/gender General Gait Details:  steady gait with RW without LOB.  Will use RW at home.     Stairs Stairs: Yes Stairs assistance: Modified independent (Device/Increase time) Stair Management: One rail Left;Step to pattern;Forwards Number of Stairs: 6 General stair comments: No difficulties and no assist needed.   Wheelchair Mobility    Modified Rankin (Stroke Patients Only)       Balance Overall balance assessment: Needs assistance Sitting-balance support: No upper extremity supported;Feet supported Sitting balance-Leahy Scale: Fair     Standing balance support: Bilateral upper extremity supported;During functional activity Standing balance-Leahy Scale: Fair Standing balance comment: can stand statically without UE support for brief periods of time                    Cognition Arousal/Alertness: Awake/alert Behavior During Therapy: WFL for tasks assessed/performed Overall Cognitive Status: Within Functional Limits for tasks assessed                      Exercises      General Comments        Pertinent Vitals/Pain Pain Assessment: No/denies pain  VSS    Home Living                      Prior Function            PT Goals (current goals can now be found in the care plan section) Progress towards PT goals: Progressing toward goals    Frequency  Min 3X/week    PT Plan Current plan remains appropriate    Co-evaluation             End of Session Equipment Utilized During Treatment: Gait  belt Activity Tolerance: Patient tolerated treatment well Patient left: in chair;with call bell/phone within reach;with chair alarm set;with nursing/sitter in room     Time: [id number] PT Time Calculation (min) (ACUTE ONLY): 14 min  Charges:  $Gait Training: 8-22 mins                    G Codes:      Destiny Day Oct 23, 2015, 12:12 PM [facility] [facility] [phone] (pager)

## 2015-10-09 NOTE — Discharge Summary (Signed)
Physician Discharge Summary  Destiny Day [id number] DOB: 11-Mar-1951 DOA: 10/06/2015  PCP: Velna Hatchet, MD  Admit date: 10/06/2015 Discharge date: 10/09/2015  Time spent: 20 minutes  Recommendations for Outpatient Follow-up:  1. Follow up with PCP in 2-3 weeks   Discharge Diagnoses:  Principal Problem:   Acute respiratory failure ([place]) Active Problems:   HLD (hyperlipidemia)   Asthma   Diastolic CHF, acute on chronic (HCC)   Hypertensive urgency   Pulmonary edema   Type 2 diabetes mellitus with diabetic nephropathy (HCC)   CKD (chronic kidney disease) stage 4, GFR 15-29 ml/min (HCC)   Acute pulmonary edema (HCC)   GERD (gastroesophageal reflux disease)   Discharge Condition: Improved  Diet recommendation: Diabetic, heart healthy  Filed Weights   10/07/15 1549 10/08/15 0612 10/09/15 0512  Weight: 64.683 kg (142 lb 9.6 oz) 64.411 kg (142 lb) 65.499 kg (144 lb 6.4 oz)    History of present illness:  Please review dictated H and P from 3/12 for details. Briefly, 65 y.o. female with PMH of hypertension, hyperlipidemia, diabetes mellitus, asthma, GERD, anxiety, GI bleeding, diastolic congestive heart failure, chronic kidney disease-stage IV, anemia, OSA not on CPAP, who presents with shortness of breath, possibly from flash pulm edema from hypertensive urgency.   Hospital Course:  1. Acute respiratory failure with hypoxia probably from flash pulm edema Sec to hypertensive urgency: - resolved. Her initial CXR shows pulm edema. She was started on IV lasix, IV NTG gtt, . Her BP measurements normalized and NTG gtt was stopped. Repeat CXR showed resolved pulm edema. 2 d ECHO with grade 2 diastolic dysfunction.Resumed home meds.   2. Questionable CAP: Patient was started on IV antibiotics empirically. Repeat CXR shows resolution after diuresis. Will complete empiric course of PO abx on discharge    3. Hypokalemia: repleted.   4. Hypertensive urgency: Resolved.    5. Acute on chronic diastolic heart failure: Much improved.   6. Diabetes mellitus: hgba1c ordered.  SSI.  Resume levemir.  Discharge Exam: Filed Vitals:   10/09/15 0014 10/09/15 0512 10/09/15 0849 10/09/15 1230  BP: 158/73 162/78  160/61  Pulse: 78 81  75  Temp: 98.2 F (36.8 C) 98.5 F (36.9 C)  98.3 F (36.8 C)  TempSrc: Oral Oral  Oral  Resp: 17 18  18   Height:      Weight:  65.499 kg (144 lb 6.4 oz)    SpO2: 98% 95% 98% 100%    General: Awake, in nad Cardiovascular: regular, s1, s2 Respiratory: normal resp effort, no wheezing  Discharge Instructions      Discharge Instructions    Ambulatory referral to Physical Therapy    Complete by:  As directed   For strengthening and endurance            Medication List    TAKE these medications        albuterol 108 (90 Base) MCG/ACT inhaler  Commonly known as:  PROVENTIL HFA;VENTOLIN HFA  Inhale 1-2 puffs into the lungs every 6 (six) hours as needed for wheezing or shortness of breath.     amLODipine 2.5 MG tablet  Commonly known as:  NORVASC  Take 1 tablet (2.5 mg total) by mouth daily.     aspirin 81 MG chewable tablet  Chew 81 mg by mouth daily.     atorvastatin 20 MG tablet  Commonly known as:  LIPITOR  Take 20 mg by mouth daily at 6 PM.     azithromycin 250 MG tablet  Commonly known as:  ZITHROMAX  1 tab po qday x 3 days, zero refills     budesonide-formoterol 160-4.5 MCG/ACT inhaler  Commonly known as:  SYMBICORT  Inhale 1 puff into the lungs every evening.     calcitRIOL 0.25 MCG capsule  Commonly known as:  ROCALTROL  Take 0.25 mcg by mouth daily.     carvedilol 12.5 MG tablet  Commonly known as:  COREG  Take 2 tablets (25 mg total) by mouth 2 (two) times daily with a meal.     cefUROXime 250 MG tablet  Commonly known as:  CEFTIN  Take 1 tablet (250 mg total) by mouth 2 (two) times daily with a meal.     ferrous sulfate 325 (65 FE) MG tablet  Take 325 mg by mouth daily with  breakfast.     insulin aspart 100 UNIT/ML injection  Commonly known as:  novoLOG  Inject 0-8 Units into the skin 3 (three) times daily before meals. Sliding scale     isosorbide-hydrALAZINE 20-37.5 MG tablet  Commonly known as:  BIDIL  Take 1 tablet by mouth 2 (two) times daily.     LEVEMIR FLEXTOUCH 100 UNIT/ML Pen  Generic drug:  Insulin Detemir  Inject 10 Units into the muscle at bedtime.     loratadine 10 MG tablet  Commonly known as:  CLARITIN  Take 10 mg by mouth daily as needed for allergies.     metolazone 2.5 MG tablet  Commonly known as:  ZAROXOLYN  Take 1 tablet (2.5 mg total) by mouth every 3 (three) days.     multivitamin Tabs tablet  Take 1 tablet by mouth daily.     omeprazole 20 MG capsule  Commonly known as:  PRILOSEC  Take 20 mg by mouth daily.     potassium chloride SA 20 MEQ tablet  Commonly known as:  K-DUR,KLOR-CON  Take 1 tablet (20 mEq total) by mouth 2 (two) times daily.     torsemide 100 MG tablet  Commonly known as:  DEMADEX  Take 1 tablet (100 mg total) by mouth daily.       Allergies  Allergen Reactions  . Lisinopril Cough  . Prednisone Other (See Comments)    Excessive fluid buildup   Follow-up Information    Follow up with [facility].   Specialty:  Rehabilitation   Why:  They will call you for a start up date and time for your outpatient physical therapy   Contact information:   [place] [phone]      Follow up with Velna Hatchet, MD. Schedule an appointment as soon as possible for a visit in 2 weeks.   Specialty:  Internal Medicine   Why:  Hospital follow up   Contact information:   [address] [place] [address] [phone]        The results of significant diagnostics from this hospitalization (including imaging, microbiology, ancillary and laboratory) are listed below for reference.    Significant  Diagnostic Studies: Dg Chest 2 View  10/08/2015  CLINICAL DATA:  65 year old female admitted for acute shortness of breath. Initial encounter. EXAM: CHEST  2 VIEW COMPARISON:  Portable chest 10/06/2015 and earlier. FINDINGS: Significantly regressed bilateral indistinct and basilar predominant pulmonary opacity suggesting regression of pulmonary edema. Pulmonary vascularity now appears near baseline. Small bilateral pleural effusions remain. Stable mild cardiomegaly. Other mediastinal contours are within normal limits. Visualized tracheal air column is within normal limits. No  pneumothorax. No acute osseous abnormality identified. Widespread calcified aortic atherosclerosis. IMPRESSION: Virtually resolved pulmonary edema. Small bilateral pleural effusions persist. Electronically Signed   By: Genevie Ann M.D.   On: 10/08/2015 14:30   Dg Chest Port 1 View  10/07/2015  CLINICAL DATA:  Acute onset of dyspnea.  Initial encounter. EXAM: PORTABLE CHEST 1 VIEW COMPARISON:  Chest radiograph performed 06/30/2015 FINDINGS: The lungs are well-aerated. Bilateral airspace opacification, more prominent on the right, may reflect pulmonary edema or pneumonia. No definite pleural effusion or pneumothorax is seen. The cardiomediastinal silhouette is borderline enlarged. No acute osseous abnormalities are seen. IMPRESSION: Bilateral airspace opacification, more prominent on the right, may reflect pulmonary edema or pneumonia. Borderline cardiomegaly. Electronically Signed   By: Garald Balding M.D.   On: 10/07/2015 00:04    Microbiology: Recent Results (from the past 240 hour(s))  MRSA PCR Screening     Status: None   Collection Time: 10/07/15  6:09 PM  Result Value Ref Range Status   MRSA by PCR NEGATIVE NEGATIVE Final    Comment:        The GeneXpert MRSA Assay (FDA approved for NASAL specimens only), is one component of a comprehensive MRSA colonization surveillance program. It is not intended to diagnose  MRSA infection nor to guide or monitor treatment for MRSA infections.      Labs: Basic Metabolic Panel:  Recent Labs Lab 10/06/15 2338 10/06/15 2348 10/07/15 0347 10/08/15 0427  NA 134* 136  --  135  K 3.1* 3.1*  --  3.9  CL 93* 94*  --  94*  CO2  --  26  --  28  GLUCOSE 255* 250*  --  232*  BUN 61* 59*  --  68*  CREATININE 3.70* 3.78*  --  4.15*  CALCIUM  --  9.2  --  9.1  MG  --   --  1.7  --    Liver Function Tests:  Recent Labs Lab 10/06/15 2348 10/08/15 0427  AST 76* 26  ALT 51 29  ALKPHOS 169* 126  BILITOT 0.8 0.6  PROT 7.5 6.7  ALBUMIN 3.4* 3.0*   No results for input(s): LIPASE, AMYLASE in the last 168 hours. No results for input(s): AMMONIA in the last 168 hours. CBC:  Recent Labs Lab 10/06/15 2338 10/06/15 2348 10/08/15 0427  WBC  --  9.5 7.8  NEUTROABS  --  7.0  --   HGB 12.6 11.1* 9.3*  HCT 37.0 34.0* 28.9*  MCV  --  90.9 91.5  PLT  --  292 263   Cardiac Enzymes:  Recent Labs Lab 10/06/15 2348 10/07/15 0346 10/07/15 0735 10/07/15 1256  TROPONINI 0.04* 0.06* 0.08* 0.06*   BNP: BNP (last 3 results)  Recent Labs  03/15/15 0225 06/30/15 2043 10/06/15 2348  BNP 1025.4* 1532.5* 799.3*    ProBNP (last 3 results) No results for input(s): PROBNP in the last 8760 hours.  CBG:  Recent Labs Lab 10/08/15 1207 10/08/15 1633 10/08/15 2121 10/09/15 0555 10/09/15 1224  GLUCAP 203* 296* 253* 232* 202*    Signed:  CHIU, STEPHEN K  [facility] 10/09/2015, 4:30 PM

## 2015-10-09 NOTE — ED Provider Notes (Signed)
CSN: [id number]     Arrival date & time 10/09/15  1941 History   First MD Initiated Contact with Patient 10/09/15 1947     Chief Complaint  Patient presents with  . Shortness of Breath     HPI 65 y.o. female with history of hypertension, asthma, insulin-dependent diabetes mellitus, preserved ejection fraction CHF with last LVEF of 55-60%, stage IV CKD, currently undergoing treatment for community-acquired pneumonia on Ceftin and azithromycin, who presents with severe shortness of breath. Of note, patient was just discharged from the hospital 3 hours ago after hypoxic respiratory failure thought to be secondary to flash pulmonary edema from hypertensive emergency. Patient was initially on BiPAP and required nitroglycerin drip. During the hospitalization she was also diagnosed with community-acquired pneumonia. She reports that she was breathing much better when she was discharged home but then all the sudden became more dyspneic. Denies chest pain. Reports a cough productive of thin white sputum. Denies nausea, vomiting, abdominal pain, chest pain, lightheadedness, fevers, body aches, dysuria, frequency.  Past Medical History  Diagnosis Date  . Hypertension   . Diverticulitis   . Hyperlipidemia   . Asthma   . Diabetes mellitus     insulin dependent  . Arthritis     knee  . GI bleed 03/31/2013  . CHF (congestive heart failure) ([place])   . Osteoporosis   . Seasonal allergies   . Anxiety   . Abdominal bruit   . Chronic kidney disease     stage 3  . Sleep apnea     can't afford cpap  . Pneumonia   . GERD (gastroesophageal reflux disease)     from medications  . Complication of anesthesia     " after I got home from my last procedure, I started itching."  . Anemia    Past Surgical History  Procedure Laterality Date  . Abdominal hysterectomy  1993`  . Esophagogastroduodenoscopy N/A 03/31/2013    Procedure: ESOPHAGOGASTRODUODENOSCOPY (EGD);  Surgeon: Gatha Mayer, MD;  Location: [facility]  ENDOSCOPY;  Service: Endoscopy;  Laterality: N/A;  . Eye surgery      laser surgery  . Bascilic vein transposition Left 07/10/2014    Procedure: BASCILIC VEIN TRANSPOSITION;  Surgeon: Angelia Mould, MD;  Location: [place];  Service: Vascular;  Laterality: Left;  . Fistulogram Left 10/29/2014    Procedure: FISTULOGRAM;  Surgeon: Angelia Mould, MD;  Location: [facility] CATH LAB;  Service: Cardiovascular;  Laterality: Left;  . Bascilic vein transposition Right 11/08/2014    Procedure: FIRST STAGE BASILIC VEIN TRANSPOSITION;  Surgeon: Angelia Mould, MD;  Location: [place];  Service: Vascular;  Laterality: Right;  . Bascilic vein transposition Right 01/18/2015    Procedure: SECOND STAGE BASILIC VEIN TRANSPOSITION;  Surgeon: Angelia Mould, MD;  Location: [facility] OR;  Service: Vascular;  Laterality: Right;  . Patch angioplasty Right 01/18/2015    Procedure: BASILIC VEIN PATCH ANGIOPLASTY USING VASCUGUARD PATCH;  Surgeon: Angelia Mould, MD;  Location: [facility] OR;  Service: Vascular;  Laterality: Right;   Family History  Problem Relation Age of Onset  . Colon cancer Neg Hx   . Other Mother     not sure of cause of death  . Diabetes Father   . Pancreatic cancer Maternal Grandmother    Social History  Substance Use Topics  . Smoking status: Former Smoker -- 0.35 packs/day for 40 years    Types: Cigarettes    Quit date: 05/10/2012  . Smokeless tobacco: Never Used  .  Alcohol Use: No   OB History    No data available     Review of Systems  Constitutional: Negative for fever and chills.  HENT: Negative for congestion, rhinorrhea and sore throat.   Eyes: Negative for visual disturbance.  Respiratory: Positive for cough and shortness of breath. Negative for wheezing.   Cardiovascular: Negative for chest pain, palpitations and leg swelling.  Gastrointestinal: Negative for nausea, vomiting, abdominal pain, diarrhea and abdominal distention.  Genitourinary: Negative for dysuria and  frequency.  Musculoskeletal: Negative for myalgias, back pain, joint swelling, arthralgias, gait problem, neck pain and neck stiffness.  Skin: Negative for rash.  Neurological: Negative for dizziness, tremors, syncope, facial asymmetry, speech difficulty, weakness, numbness and headaches.  Psychiatric/Behavioral: Negative for behavioral problems, confusion and agitation.      Allergies  Lisinopril and Prednisone  Home Medications   Prior to Admission medications   Medication Sig Start Date End Date Taking? Authorizing Provider  albuterol (PROVENTIL HFA;VENTOLIN HFA) 108 (90 BASE) MCG/ACT inhaler Inhale 1-2 puffs into the lungs every 6 (six) hours as needed for wheezing or shortness of breath.   Yes Historical Provider, MD  amLODipine (NORVASC) 2.5 MG tablet Take 1 tablet (2.5 mg total) by mouth daily. 07/03/15  Yes Shanker Kristeen Mans, MD  aspirin 81 MG chewable tablet Chew 81 mg by mouth daily.   Yes Historical Provider, MD  atorvastatin (LIPITOR) 20 MG tablet Take 20 mg by mouth daily at 6 PM.   Yes Historical Provider, MD  azithromycin (ZITHROMAX) 250 MG tablet 1 tab po qday x 3 days, zero refills 10/09/15  Yes Donne Hazel, MD  budesonide-formoterol [facility]) 160-4.5 MCG/ACT inhaler Inhale 1 puff into the lungs every evening.   Yes Historical Provider, MD  calcitRIOL (ROCALTROL) 0.25 MCG capsule Take 0.25 mcg by mouth daily.   Yes Historical Provider, MD  carvedilol (COREG) 12.5 MG tablet Take 2 tablets (25 mg total) by mouth 2 (two) times daily with a meal. 07/03/15  Yes Shanker Kristeen Mans, MD  cefUROXime (CEFTIN) 250 MG tablet Take 1 tablet (250 mg total) by mouth 2 (two) times daily with a meal. 10/09/15  Yes Donne Hazel, MD  ferrous sulfate 325 (65 FE) MG tablet Take 325 mg by mouth daily with breakfast.   Yes Historical Provider, MD  insulin aspart (NOVOLOG) 100 UNIT/ML injection Inject 0-8 Units into the skin 3 (three) times daily before meals. Sliding scale   Yes Historical  Provider, MD  isosorbide-hydrALAZINE (BIDIL) 20-37.5 MG tablet Take 1 tablet by mouth 2 (two) times daily. 07/03/15  Yes Shanker Kristeen Mans, MD  LEVEMIR FLEXTOUCH 100 UNIT/ML Pen Inject 10 Units into the muscle at bedtime.  01/16/15  Yes Historical Provider, MD  loratadine (CLARITIN) 10 MG tablet Take 10 mg by mouth daily as needed for allergies.   Yes Historical Provider, MD  metolazone (ZAROXOLYN) 2.5 MG tablet Take 1 tablet (2.5 mg total) by mouth every 3 (three) days. 07/03/15  Yes Shanker Kristeen Mans, MD  multivitamin (RENA-VIT) TABS tablet Take 1 tablet by mouth daily. 01/24/15  Yes Historical Provider, MD  omeprazole (PRILOSEC) 20 MG capsule Take 20 mg by mouth daily.   Yes Historical Provider, MD  potassium chloride SA (K-DUR,KLOR-CON) 20 MEQ tablet Take 1 tablet (20 mEq total) by mouth 2 (two) times daily. 06/17/15  Yes Thurnell Lose, MD  torsemide (DEMADEX) 100 MG tablet Take 1 tablet (100 mg total) by mouth daily. 06/17/15  Yes Thurnell Lose, MD   BP  190/83 mmHg  Pulse 74  Temp(Src) 98 F (36.7 C) (Oral)  Resp 15  Ht 5\' 4"  (1.626 m)  SpO2 100% Physical Exam  Constitutional: She is oriented to person, place, and time. She appears well-developed and well-nourished. No distress.  HENT:  Head: Normocephalic and atraumatic.  Right Ear: External ear normal.  Left Ear: External ear normal.  Nose: Nose normal.  Mouth/Throat: Oropharynx is clear and moist. No oropharyngeal exudate.  Eyes: Conjunctivae and EOM are normal. Pupils are equal, round, and reactive to light. Right eye exhibits no discharge. Left eye exhibits no discharge.  Neck: Normal range of motion. Neck supple.  Cardiovascular: Normal rate, regular rhythm and normal heart sounds.  Exam reveals no gallop and no friction rub.   No murmur heard. Pulmonary/Chest: She is in respiratory distress. She has no wheezes. She has no rales.  Increased work of breathing. Course crackles throughout. Tachypneic  Abdominal: Soft. Bowel  sounds are normal. She exhibits no distension and no mass. There is no tenderness. There is no rebound and no guarding.  Musculoskeletal: Normal range of motion. She exhibits no edema or tenderness.  Neurological: She is alert and oriented to person, place, and time. She exhibits normal muscle tone.  Skin: Skin is warm and dry. No rash noted. She is not diaphoretic.  Psychiatric: She has a normal mood and affect. Her behavior is normal. Judgment and thought content normal.    ED Course  Procedures (including critical care time) Labs Review Labs Reviewed  CBC WITH DIFFERENTIAL/PLATELET - Abnormal; Notable for the following:    RBC 3.40 (*)    Hemoglobin 10.3 (*)    HCT 31.6 (*)    All other components within normal limits  COMPREHENSIVE METABOLIC PANEL - Abnormal; Notable for the following:    Sodium 132 (*)    Chloride 93 (*)    Glucose, Bld 206 (*)    BUN 68 (*)    Creatinine, Ser 4.18 (*)    Albumin 3.3 (*)    Alkaline Phosphatase 137 (*)    GFR calc non Af Amer 10 (*)    GFR calc Af Amer 12 (*)    All other components within normal limits  BRAIN NATRIURETIC PEPTIDE - Abnormal; Notable for the following:    B Natriuretic Peptide 966.4 (*)    All other components within normal limits  I-STAT ARTERIAL BLOOD GAS, ED - Abnormal; Notable for the following:    pCO2 arterial 49.3 (*)    pO2, Arterial 183.0 (*)    Bicarbonate 29.7 (*)    Acid-Base Excess 4.0 (*)    All other components within normal limits  TROPONIN I  TROPONIN I  TROPONIN I  CBC  CREATININE, SERUM  COMPREHENSIVE METABOLIC PANEL  CBC WITH DIFFERENTIAL/PLATELET  I-STAT TROPOININ, ED  I-STAT CG4 LACTIC ACID, ED  I-STAT TROPOININ, ED  I-STAT CG4 LACTIC ACID, ED    Imaging Review Dg Chest 2 View  10/08/2015  CLINICAL DATA:  65 year old female admitted for acute shortness of breath. Initial encounter. EXAM: CHEST  2 VIEW COMPARISON:  Portable chest 10/06/2015 and earlier. FINDINGS: Significantly regressed  bilateral indistinct and basilar predominant pulmonary opacity suggesting regression of pulmonary edema. Pulmonary vascularity now appears near baseline. Small bilateral pleural effusions remain. Stable mild cardiomegaly. Other mediastinal contours are within normal limits. Visualized tracheal air column is within normal limits. No pneumothorax. No acute osseous abnormality identified. Widespread calcified aortic atherosclerosis. IMPRESSION: Virtually resolved pulmonary edema. Small bilateral pleural effusions persist. Electronically Signed  By: Genevie Ann M.D.   On: 10/08/2015 14:30   Dg Chest Port 1 View  10/09/2015  CLINICAL DATA:  Shortness of breath for 1 day EXAM: PORTABLE CHEST 1 VIEW COMPARISON:  10/08/2015 FINDINGS: Cardiac shadow is stable. Mild increased vascular congestion is again noted with interstitial edema. This has increased in the interval from the prior exam. Small pleural effusions are noted. No bony abnormality is seen. IMPRESSION: Increase in vascular congestion and interstitial edema. Electronically Signed   By: Inez Catalina M.D.   On: 10/09/2015 20:24   I have personally reviewed and evaluated these images and lab results as part of my medical decision-making.   EKG Interpretation   Date/Time:  Wednesday October 09 2015 19:54:52 EDT Ventricular Rate:  100 PR Interval:  166 QRS Duration: 92 QT Interval:  365 QTC Calculation: 471 R Axis:   59 Text Interpretation:  Sinus tachycardia Left atrial enlargement  Nonspecific T abnormalities, lateral leads New nonspecific changes V6 No  other significant changes Confirmed by [facility] MD, ERIN ([id number]) on  10/09/2015 8:47:09 PM      MDM   Final diagnoses:  Acute pulmonary edema ([place])  Hypertensive emergency    Patient noted to be tachypneic and in respiratory distress on arrival. Coarse crackles heard throughout all lung fields. Patient started on BiPAP with great improvement in work of breathing. Arterial blood gas with PCO2  of 49.3, pH of 7.387, and PO2 of 183. Chest x-ray revealed worsening vascular congestion and interstitial edema. BNP elevated to 966.4. Patient was hypertensive on arrival with blood pressure of 230/170. She was started on a nitroglycerin drip with improvement in blood pressure as well as work of breathing. Suspect flash pulmonary edema in the setting of hypertensive emergency. Patient is currently undergoing treatment for community-acquired pneumonia with Ceftin and azithromycin. Recommend completing this treatment as prescribed as an inpatient. Creatinine elevated to 4.18 which appears to be at the patient's baseline. She does continue to make urine. We'll administer IV Lasix in the emergency department for improvement in fluid overload state. EKG without evidence of acute ischemic event and patient denies chest pain. No lactic acidosis and initial troponin is 0.04. We'll plan on obtaining delta. CBC was no leukocytosis. Patient will be admitted to the stepdown unit for further management. She is stable for transfer.    Jenifer Algernon Huxley, MD 10/10/15 [id number]  Gareth Morgan, MD 10/10/15 1353

## 2015-10-09 NOTE — Progress Notes (Signed)
Pt discharged home, IV and tele removed. Discharged instructions given. Questions answered.

## 2015-10-10 DIAGNOSIS — I160 Hypertensive urgency: Secondary | ICD-10-CM

## 2015-10-10 DIAGNOSIS — J810 Acute pulmonary edema: Secondary | ICD-10-CM

## 2015-10-10 DIAGNOSIS — E1121 Type 2 diabetes mellitus with diabetic nephropathy: Secondary | ICD-10-CM

## 2015-10-10 DIAGNOSIS — N184 Chronic kidney disease, stage 4 (severe): Secondary | ICD-10-CM

## 2015-10-10 LAB — CBC WITH DIFFERENTIAL/PLATELET
Basophils Absolute: 0 10*3/uL (ref 0.0–0.1)
Basophils Relative: 0 %
Eosinophils Absolute: 0.2 10*3/uL (ref 0.0–0.7)
Eosinophils Relative: 3 %
HCT: 29.4 % — ABNORMAL LOW (ref 36.0–46.0)
Hemoglobin: 9.3 g/dL — ABNORMAL LOW (ref 12.0–15.0)
Lymphocytes Relative: 24 %
Lymphs Abs: 1.9 10*3/uL (ref 0.7–4.0)
MCH: 29.2 pg (ref 26.0–34.0)
MCHC: 31.6 g/dL (ref 30.0–36.0)
MCV: 92.5 fL (ref 78.0–100.0)
Monocytes Absolute: 0.5 10*3/uL (ref 0.1–1.0)
Monocytes Relative: 6 %
Neutro Abs: 5.3 10*3/uL (ref 1.7–7.7)
Neutrophils Relative %: 67 %
Platelets: 261 10*3/uL (ref 150–400)
RBC: 3.18 MIL/uL — ABNORMAL LOW (ref 3.87–5.11)
RDW: 12.1 % (ref 11.5–15.5)
WBC: 7.9 10*3/uL (ref 4.0–10.5)

## 2015-10-10 LAB — COMPREHENSIVE METABOLIC PANEL
ALT: 23 U/L (ref 14–54)
AST: 20 U/L (ref 15–41)
Albumin: 3.1 g/dL — ABNORMAL LOW (ref 3.5–5.0)
Alkaline Phosphatase: 119 U/L (ref 38–126)
Anion gap: 15 (ref 5–15)
BUN: 74 mg/dL — ABNORMAL HIGH (ref 6–20)
CO2: 26 mmol/L (ref 22–32)
Calcium: 9.6 mg/dL (ref 8.9–10.3)
Chloride: 95 mmol/L — ABNORMAL LOW (ref 101–111)
Creatinine, Ser: 4.35 mg/dL — ABNORMAL HIGH (ref 0.44–1.00)
GFR calc Af Amer: 11 mL/min — ABNORMAL LOW (ref >60)
GFR calc non Af Amer: 10 mL/min — ABNORMAL LOW (ref >60)
Glucose, Bld: 183 mg/dL — ABNORMAL HIGH (ref 65–99)
Potassium: 4.4 mmol/L (ref 3.5–5.1)
Sodium: 136 mmol/L (ref 135–145)
Total Bilirubin: 0.5 mg/dL (ref 0.3–1.2)
Total Protein: 6.8 g/dL (ref 6.5–8.1)

## 2015-10-10 LAB — CBC
HCT: 29.4 % — ABNORMAL LOW (ref 36.0–46.0)
Hemoglobin: 9.6 g/dL — ABNORMAL LOW (ref 12.0–15.0)
MCH: 30.2 pg (ref 26.0–34.0)
MCHC: 32.7 g/dL (ref 30.0–36.0)
MCV: 92.5 fL (ref 78.0–100.0)
Platelets: 258 10*3/uL (ref 150–400)
RBC: 3.18 MIL/uL — ABNORMAL LOW (ref 3.87–5.11)
RDW: 12.1 % (ref 11.5–15.5)
WBC: 8.5 10*3/uL (ref 4.0–10.5)

## 2015-10-10 LAB — TROPONIN I
Troponin I: 0.04 ng/mL — ABNORMAL HIGH (ref <0.031)
Troponin I: 0.04 ng/mL — ABNORMAL HIGH (ref <0.031)
Troponin I: 0.04 ng/mL — ABNORMAL HIGH (ref <0.031)

## 2015-10-10 LAB — CREATININE, SERUM
Creatinine, Ser: 4.27 mg/dL — ABNORMAL HIGH (ref 0.44–1.00)
GFR calc Af Amer: 12 mL/min — ABNORMAL LOW (ref >60)
GFR calc non Af Amer: 10 mL/min — ABNORMAL LOW (ref >60)

## 2015-10-10 LAB — CBG MONITORING, ED: Glucose-Capillary: 220 mg/dL — ABNORMAL HIGH (ref 65–99)

## 2015-10-10 LAB — GLUCOSE, CAPILLARY
Glucose-Capillary: 203 mg/dL — ABNORMAL HIGH (ref 65–99)
Glucose-Capillary: 187 mg/dL — ABNORMAL HIGH (ref 65–99)

## 2015-10-10 LAB — HEMOGLOBIN A1C
Hgb A1c MFr Bld: 9.8 % — ABNORMAL HIGH (ref 4.8–5.6)
Mean Plasma Glucose: 235 mg/dL

## 2015-10-10 MED ORDER — ISOSORB DINITRATE-HYDRALAZINE 20-37.5 MG PO TABS
1.0000 | ORAL_TABLET | Freq: Two times a day (BID) | ORAL | Status: DC
  Administered 2015-10-10 – 2015-10-14 (×9): 1 via ORAL
  Filled 2015-10-10 (×12): qty 1

## 2015-10-10 MED ORDER — AMLODIPINE BESYLATE 5 MG PO TABS
5.0000 mg | ORAL_TABLET | Freq: Every day | ORAL | Status: DC
  Administered 2015-10-10: 5 mg via ORAL
  Filled 2015-10-10: qty 1

## 2015-10-10 MED ORDER — METOLAZONE 2.5 MG PO TABS
2.5000 mg | ORAL_TABLET | Freq: Every day | ORAL | Status: DC
  Administered 2015-10-10 – 2015-10-11 (×2): 2.5 mg via ORAL
  Filled 2015-10-10 (×3): qty 1

## 2015-10-10 MED ORDER — SODIUM CHLORIDE 0.9 % IV SOLN: 100.0000 mL | INTRAVENOUS | Status: DC | PRN

## 2015-10-10 MED ORDER — PENTAFLUOROPROP-TETRAFLUOROETH EX AERO: 1.0000 "application " | INHALATION_SPRAY | CUTANEOUS | Status: DC | PRN

## 2015-10-10 MED ORDER — LIDOCAINE-PRILOCAINE 2.5-2.5 % EX CREA: 1.0000 "application " | TOPICAL_CREAM | CUTANEOUS | Status: DC | PRN

## 2015-10-10 MED ORDER — LIDOCAINE HCL (PF) 1 % IJ SOLN: 5.0000 mL | INTRAMUSCULAR | Status: DC | PRN

## 2015-10-10 MED ORDER — ALTEPLASE 2 MG IJ SOLR: 2.0000 mg | Freq: Once | INTRAMUSCULAR | Status: DC | PRN

## 2015-10-10 MED ORDER — FUROSEMIDE 10 MG/ML IJ SOLN
160.0000 mg | Freq: Once | INTRAVENOUS | Status: AC
  Administered 2015-10-10: 160 mg via INTRAVENOUS
  Filled 2015-10-10: qty 16

## 2015-10-10 MED ORDER — HEPARIN SODIUM (PORCINE) 1000 UNIT/ML DIALYSIS
20.0000 [IU]/kg | INTRAMUSCULAR | Status: DC | PRN
  Filled 2015-10-10: qty 2

## 2015-10-10 MED ORDER — INSULIN DETEMIR 100 UNIT/ML ~~LOC~~ SOLN
10.0000 [IU] | Freq: Every day | SUBCUTANEOUS | Status: DC
  Administered 2015-10-10 – 2015-10-14 (×5): 10 [IU] via SUBCUTANEOUS
  Filled 2015-10-10 (×7): qty 0.1

## 2015-10-10 MED ORDER — HEPARIN SODIUM (PORCINE) 1000 UNIT/ML DIALYSIS
1000.0000 [IU] | INTRAMUSCULAR | Status: DC | PRN
  Filled 2015-10-10: qty 1

## 2015-10-10 NOTE — Progress Notes (Signed)
[facility] PROGRESS NOTE  Destiny Day [id number] DOB: 1950-09-14 DOA: 10/09/2015 PCP: Velna Hatchet, MD  HPI: 65 year old female who was discharged home yesterday after being treated for acute pulmonary edema and hypertensive urgency presents to the ED after developing sudden onset of shortness of breath after arriving home. Pt states she had just picked up her discharge prescriptions and arrived home when she suddenly became short of breath. Presented to the ED with BP of 255/109, placed on nitro drip. Chest x-ray on presentation in ED (3/15) shows increased vascular congestion and interstitial edema which is new from chest x-ray on discharge of 3/14 which showed resolved pulmonary edema. 2D echo on 3/15 also showed grade 2 diastolic dysfunction with EF of 55-60%. Given IV lasix.   Subjective: States her shortness of breath has improved since arrival in the ED. Denies any chest pain or significant lower extremity edema.  Assessment/Plan:  Acute pulmonary edema secondary to hypertensive urgency - fluid overload, suspect related to CKD with impending need for dialysis - presented for same on 3/13, placed on nitro drip and IV Lasix at that time with resolution, then had acute return of symptoms upon arrival home after discharge - chest x-ray on presentation shows increased vascular congestion and interstitial edema, pt requiring oxygen therapy - 2D echo on 3/15 showed EF of 55-60% with grade 2 diastolic dysfunction - placed on Lasix 60 mg IV BID, nitro drip - closely monitor O2 sats, BP, troponin showed flat trend, likely not ACS. - Nephrology to evaluate  Hypertensive urgency  - presented for same on 3/13, likely volume related - placed on nitro drip and IV Lasix - BP of 255/109 on arrival, recheck at 1200 today with BP reading of 179/80, continue to monitor  Acute respiratory failure with hypoxia - due to the above, sats 100% on 2L O2 via nasal cannula  - was  discharged on 3/15 with sats 100% on RA - will continue to monitor and gradually wean off of oxygen after Lasix therapy with improvement of pulmonary edema  Acute on chronic diastolic CHF - 2D echo on 3/15 showed EF of 55-60% with grade 2 diastolic dysfunction - pt on torsemide 100 mg daily and metolazone at home, presents with trace LE edema and pulmonary edema - lasix 60 mg IV BID - monitor daily weights, strict I & O, sodium restricted diet - continue ASA and coreg  Questionable pneumonia - placed on azithromycin for questionable pneumonia on chest x-ray, 3 days remaining   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  - AV fistula placed in October 2016, has not been dialyzed yet but will likely need this to remove fluid excess and resolve pulmonary edema - creatinine 4.35, near baseline ~4.1 - Nephrology on consult  Chronic anemia from CKD  - Hgb 9.3, stable, will continue to monitor   Diabetes mellitus type 2  - on Levemir 10 units at bedtime, will likely need adjustment as Hgb A1C was 9.8 on 3/15 - sliding scale insulin while inpatient   Code Status: Full code Family Communication: Husband at bedside Disposition Plan: [place]   Consultants:  Nephrology  Procedures:  None  Antibiotics:  azithromycin   Objective: Filed Vitals:   10/10/15 0800 10/10/15 0830  BP: 182/76 171/67  Pulse: 80 78  Temp:    Resp: 16 16    Intake/Output Summary (Last 24 hours) at 10/10/15 1113 Last data filed at 10/10/15 0800  Gross per 24 hour  Intake      0 ml  Output    350 ml  Net   -350 ml   There were no vitals filed for this visit.  Exam:   General:  Alert and oriented, in no acute distress  Cardiovascular: RRR  Respiratory: CTAB  Abdomen: Soft, nontender  Musculoskeletal: Trace edema, bilateral lower extremities  Data Reviewed: Basic Metabolic Panel:  Recent Labs Lab 10/06/15 2338 10/06/15 2348 10/07/15 0347 10/08/15 0427 10/09/15 1952 10/10/15 0125  10/10/15 0523  NA 134* 136  --  135 132*  --  136  K 3.1* 3.1*  --  3.9 5.0  --  4.4  CL 93* 94*  --  94* 93*  --  95*  CO2  --  26  --  28 25  --  26  GLUCOSE 255* 250*  --  232* 206*  --  183*  BUN 61* 59*  --  68* 68*  --  74*  CREATININE 3.70* 3.78*  --  4.15* 4.18* 4.27* 4.35*  CALCIUM  --  9.2  --  9.1 9.5  --  9.6  MG  --   --  1.7  --   --   --   --    Liver Function Tests:  Recent Labs Lab 10/06/15 2348 10/08/15 0427 10/09/15 1952 10/10/15 0523  AST 76* 26 27 20   ALT 51 29 25 23   ALKPHOS 169* 126 137* 119  BILITOT 0.8 0.6 0.6 0.5  PROT 7.5 6.7 7.5 6.8  ALBUMIN 3.4* 3.0* 3.3* 3.1*   No results for input(s): LIPASE, AMYLASE in the last 168 hours. No results for input(s): AMMONIA in the last 168 hours. CBC:  Recent Labs Lab 10/06/15 2348 10/08/15 0427 10/09/15 1952 10/10/15 0125 10/10/15 0523  WBC 9.5 7.8 7.6 8.5 7.9  NEUTROABS 7.0  --  4.7  --  5.3  HGB 11.1* 9.3* 10.3* 9.6* 9.3*  HCT 34.0* 28.9* 31.6* 29.4* 29.4*  MCV 90.9 91.5 92.9 92.5 92.5  PLT 292 263 287 258 261   Cardiac Enzymes:  Recent Labs Lab 10/07/15 0346 10/07/15 0735 10/07/15 1256 10/10/15 0125 10/10/15 0523  TROPONINI 0.06* 0.08* 0.06* 0.04* 0.04*   BNP (last 3 results)  Recent Labs  06/30/15 2043 10/06/15 2348 10/09/15 1952  BNP 1532.5* 799.3* 966.4*    ProBNP (last 3 results) No results for input(s): PROBNP in the last 8760 hours.  CBG:  Recent Labs Lab 10/08/15 1207 10/08/15 1633 10/08/15 2121 10/09/15 0555 10/09/15 1224  GLUCAP 203* 296* 253* 232* 202*    Recent Results (from the past 240 hour(s))  MRSA PCR Screening     Status: None   Collection Time: 10/07/15  6:09 PM  Result Value Ref Range Status   MRSA by PCR NEGATIVE NEGATIVE Final    Comment:        The GeneXpert MRSA Assay (FDA approved for NASAL specimens only), is one component of a comprehensive MRSA colonization surveillance program. It is not intended to diagnose MRSA infection nor to  guide or monitor treatment for MRSA infections.      Studies: Dg Chest 2 View  10/08/2015  CLINICAL DATA:  65 year old female admitted for acute shortness of breath. Initial encounter. EXAM: CHEST  2 VIEW COMPARISON:  Portable chest 10/06/2015 and earlier. FINDINGS: Significantly regressed bilateral indistinct and basilar predominant pulmonary opacity suggesting regression of pulmonary edema. Pulmonary vascularity now appears near baseline. Small bilateral pleural effusions remain. Stable mild cardiomegaly. Other mediastinal contours are within normal limits. Visualized tracheal air column is within normal limits.  No pneumothorax. No acute osseous abnormality identified. Widespread calcified aortic atherosclerosis. IMPRESSION: Virtually resolved pulmonary edema. Small bilateral pleural effusions persist. Electronically Signed   By: Genevie Ann M.D.   On: 10/08/2015 14:30   Dg Chest Port 1 View  10/09/2015  CLINICAL DATA:  Shortness of breath for 1 day EXAM: PORTABLE CHEST 1 VIEW COMPARISON:  10/08/2015 FINDINGS: Cardiac shadow is stable. Mild increased vascular congestion is again noted with interstitial edema. This has increased in the interval from the prior exam. Small pleural effusions are noted. No bony abnormality is seen. IMPRESSION: Increase in vascular congestion and interstitial edema. Electronically Signed   By: Inez Catalina M.D.   On: 10/09/2015 20:24    Scheduled Meds: . amLODipine  5 mg Oral Daily  . aspirin  81 mg Oral Daily  . atorvastatin  20 mg Oral q1800  . azithromycin  250 mg Oral Daily  . calcitRIOL  0.25 mcg Oral Daily  . carvedilol  25 mg Oral BID WC  . cefUROXime  250 mg Oral BID WC  . ferrous sulfate  325 mg Oral Q breakfast  . furosemide  60 mg Intravenous Q12H  . heparin  5,000 Units Subcutaneous 3 times per day  . insulin aspart  0-9 Units Subcutaneous TID WC  . Insulin Detemir  10 Units Subcutaneous QHS  . isosorbide-hydrALAZINE  1 tablet Oral BID  . metolazone   2.5 mg Oral Daily  . mometasone-formoterol  2 puff Inhalation BID  . multivitamin  1 tablet Oral Daily  . nitroGLYCERIN  0-200 mcg/min Intravenous Once  . pantoprazole  40 mg Oral Daily  . potassium chloride SA  20 mEq Oral BID   Continuous Infusions:   Active Problems:   Hypertensive urgency   Pulmonary edema   Type 2 diabetes mellitus with diabetic nephropathy (HCC)   CKD (chronic kidney disease) stage 4, GFR 15-29 ml/min (HCC)   Acute pulmonary edema ([place])    Time spent: [place], PA-S wrote parts of this note [facility] Pager [phone] If 7PM-7AM, please contact night-coverage at [email or web address], password [facility] 10/10/2015, 11:13 AM  LOS: 1 day     Birdie Hopes Pager: [phone] 10/10/2015, 1:13 PM

## 2015-10-10 NOTE — Progress Notes (Signed)
Rn called to room secondary to pt feeling "lighthead." BP was 109/48 on nitro gtt. Notified Dr. Hartford Poli and new orders provided to reduce nitro for 30 minutes and then turn off the gtt. Will continue to monitor for changes.

## 2015-10-10 NOTE — ED Notes (Signed)
Report given.

## 2015-10-10 NOTE — Consult Note (Signed)
[facility] Renal Consultation Note  Requesting MD: Elmahi Indication for Consultation: advanced CKD- pulm edema   HPI:  Destiny Day is a 65 y.o. female with past medical history significant for hypertension, diabetes mellitus, hyperlipidemia, mild heart failure as well as advanced CKD followed by Dr. Lorrene Reid at [facility] last seen on January 31. She has a mature AV fistula and initiation of dialysis was discussed at that office visit due to was felt to be uremic symptoms. Patient is now status post hospitalization from 3/13  To 3/15 for pulmonary edema. Workup during that hospitalization showed a good ejection fraction at 55-60%. She was discharged on the 15th. She noted even last night she was having difficulty breathing and re-presented to the emergency room today with shortness of breath requiring BiPAP support but currently on nasal cannula oxygen. She also reports decreased energy and decreased appetite so this likely represents uremic symptomatology. She understands that with these recent events and recent symptoms it is likely time to initiate dialysis therapy and she is agreeable.  CREATININE, SER  Date/Time Value Ref Range Status  10/10/2015 05:23 AM 4.35* 0.44 - 1.00 mg/dL Final  10/10/2015 01:25 AM 4.27* 0.44 - 1.00 mg/dL Final  10/09/2015 07:52 PM 4.18* 0.44 - 1.00 mg/dL Final  10/08/2015 04:27 AM 4.15* 0.44 - 1.00 mg/dL Final  10/06/2015 11:48 PM 3.78* 0.44 - 1.00 mg/dL Final  10/06/2015 11:38 PM 3.70* 0.44 - 1.00 mg/dL Final  08/13/2015 03:33 PM 4.00* 0.44 - 1.00 mg/dL Final  07/03/2015 03:53 AM 4.08* 0.44 - 1.00 mg/dL Final  07/02/2015 04:12 AM 3.95* 0.44 - 1.00 mg/dL Final  07/01/2015 04:21 AM 3.60* 0.44 - 1.00 mg/dL Final  06/30/2015 08:50 PM 3.20* 0.44 - 1.00 mg/dL Final  06/30/2015 08:42 PM 3.23* 0.44 - 1.00 mg/dL Final  06/18/2015 07:59 PM 4.28* 0.44 - 1.00 mg/dL Final  06/14/2015 03:23 AM 4.37* 0.44 - 1.00 mg/dL Final  06/13/2015  08:40 AM 5.15* 0.44 - 1.00 mg/dL Final  06/12/2015 04:13 PM 5.12* 0.44 - 1.00 mg/dL Final  03/17/2015 04:49 AM 3.41* 0.44 - 1.00 mg/dL Final  03/15/2015 07:44 AM 3.01* 0.44 - 1.00 mg/dL Final  03/15/2015 02:25 AM 3.07* 0.44 - 1.00 mg/dL Final  02/03/2015 01:37 PM 3.12* 0.44 - 1.00 mg/dL Final  12/10/2014 12:47 AM 2.89* 0.44 - 1.00 mg/dL Final  10/29/2014 07:35 AM 3.20* 0.50 - 1.10 mg/dL Final  09/05/2014 05:10 AM 4.31* 0.50 - 1.10 mg/dL Final  09/04/2014 02:59 AM 4.14* 0.50 - 1.10 mg/dL Final  09/03/2014 04:00 AM 4.09* 0.50 - 1.10 mg/dL Final  09/01/2014 06:32 AM 3.31* 0.50 - 1.10 mg/dL Final  09/01/2014 01:24 AM 3.30* 0.50 - 1.10 mg/dL Final  08/31/2014 07:40 PM 3.26* 0.50 - 1.10 mg/dL Final  08/01/2014 03:16 AM 3.63* 0.50 - 1.10 mg/dL Final  07/31/2014 01:17 AM 3.02* 0.50 - 1.10 mg/dL Final  07/30/2014 07:35 PM 2.89* 0.50 - 1.10 mg/dL Final  05/15/2014 04:17 PM 2.58* 0.50 - 1.10 mg/dL Final  05/01/2014 04:36 PM 2.89* 0.50 - 1.10 mg/dL Final  03/22/2014 11:38 PM 2.48* 0.50 - 1.10 mg/dL Final  01/16/2014 09:32 PM 2.99* 0.50 - 1.10 mg/dL Final  01/11/2014 02:41 AM 2.28* 0.50 - 1.10 mg/dL Final  01/10/2014 04:25 PM 2.24* 0.50 - 1.10 mg/dL Final  01/10/2014 02:22 AM 2.13* 0.50 - 1.10 mg/dL Final  01/09/2014 11:26 PM 2.40* 0.50 - 1.10 mg/dL Final  01/07/2014 01:21 AM 2.35* 0.50 - 1.10 mg/dL Final  09/08/2013 07:09 PM 1.95* 0.50 - 1.10 mg/dL  Final  07/23/2013 06:41 AM 1.63* 0.50 - 1.10 mg/dL Final  07/20/2013 10:29 PM 1.70* 0.50 - 1.10 mg/dL Final  07/20/2013 10:10 PM 1.61* 0.50 - 1.10 mg/dL Final  07/12/2013 08:35 PM 1.78* 0.50 - 1.10 mg/dL Final  06/01/2013 05:40 AM 2.22* 0.50 - 1.10 mg/dL Final  05/31/2013 04:15 AM 2.27* 0.50 - 1.10 mg/dL Final  05/30/2013 04:40 AM 2.16* 0.50 - 1.10 mg/dL Final  05/29/2013 10:00 AM 1.93* 0.50 - 1.10 mg/dL Final  05/28/2013 06:25 AM 2.03* 0.50 - 1.10 mg/dL Final  05/27/2013 10:20 AM 2.07* 0.50 - 1.10 mg/dL Final  05/25/2013 05:16 AM 1.86* 0.50 -  1.10 mg/dL Final     PMHx:   Past Medical History  Diagnosis Date  . Hypertension   . Diverticulitis   . Hyperlipidemia   . Asthma   . Diabetes mellitus     insulin dependent  . Arthritis     knee  . GI bleed 03/31/2013  . CHF (congestive heart failure) ([place])   . Osteoporosis   . Seasonal allergies   . Anxiety   . Abdominal bruit   . Chronic kidney disease     stage 3  . Sleep apnea     can't afford cpap  . Pneumonia   . GERD (gastroesophageal reflux disease)     from medications  . Complication of anesthesia     " after I got home from my last procedure, I started itching."  . Anemia     Past Surgical History  Procedure Laterality Date  . Abdominal hysterectomy  1993`  . Esophagogastroduodenoscopy N/A 03/31/2013    Procedure: ESOPHAGOGASTRODUODENOSCOPY (EGD);  Surgeon: Gatha Mayer, MD;  Location[facility] ENDOSCOPY;  Service: Endoscopy;  Laterality: N/A;  . Eye surgery      laser surgery  . Bascilic vein transposition Left 07/10/2014    Procedure: BASCILIC VEIN TRANSPOSITION;  Surgeon: Angelia Mould, MD;  Location: [place];  Service: Vascular;  Laterality: Left;  . Fistulogram Left 10/29/2014    Procedure: FISTULOGRAM;  Surgeon: Angelia Mould, MD;  Location: [facility] CATH LAB;  Service: Cardiovascular;  Laterality: Left;  . Bascilic vein transposition Right 11/08/2014    Procedure: FIRST STAGE BASILIC VEIN TRANSPOSITION;  Surgeon: Angelia Mould, MD;  Location: [place];  Service: Vascular;  Laterality: Right;  . Bascilic vein transposition Right 01/18/2015    Procedure: SECOND STAGE BASILIC VEIN TRANSPOSITION;  Surgeon: Angelia Mould, MD;  Location: [place];  Service: Vascular;  Laterality: Right;  . Patch angioplasty Right 01/18/2015    Procedure: BASILIC VEIN PATCH ANGIOPLASTY USING VASCUGUARD PATCH;  Surgeon: Angelia Mould, MD;  Location: [facility] OR;  Service: Vascular;  Laterality: Right;    Family Hx:  Family History  Problem Relation Age of  Onset  . Colon cancer Neg Hx   . Other Mother     not sure of cause of death  . Diabetes Father   . Pancreatic cancer Maternal Grandmother     Social History:  reports that she quit smoking about 3 years ago. Her smoking use included Cigarettes. She has a 14 pack-year smoking history. She has never used smokeless tobacco. She reports that she uses illicit drugs. She reports that she does not drink alcohol.  Allergies:  Allergies  Allergen Reactions  . Lisinopril Cough  . Prednisone Other (See Comments)    Excessive fluid buildup    Medications: Prior to Admission medications   Medication Sig Start Date End Date Taking? Authorizing Provider  albuterol (PROVENTIL HFA;VENTOLIN HFA) 108 (90 BASE) MCG/ACT inhaler Inhale 1-2 puffs into the lungs every 6 (six) hours as needed for wheezing or shortness of breath.   Yes Historical Provider, MD  amLODipine (NORVASC) 2.5 MG tablet Take 1 tablet (2.5 mg total) by mouth daily. 07/03/15  Yes Shanker Kristeen Mans, MD  aspirin 81 MG chewable tablet Chew 81 mg by mouth daily.   Yes Historical Provider, MD  atorvastatin (LIPITOR) 20 MG tablet Take 20 mg by mouth daily at 6 PM.   Yes Historical Provider, MD  azithromycin (ZITHROMAX) 250 MG tablet 1 tab po qday x 3 days, zero refills 10/09/15  Yes Donne Hazel, MD  budesonide-formoterol [facility]) 160-4.5 MCG/ACT inhaler Inhale 1 puff into the lungs every evening.   Yes Historical Provider, MD  calcitRIOL (ROCALTROL) 0.25 MCG capsule Take 0.25 mcg by mouth daily.   Yes Historical Provider, MD  carvedilol (COREG) 12.5 MG tablet Take 2 tablets (25 mg total) by mouth 2 (two) times daily with a meal. 07/03/15  Yes Shanker Kristeen Mans, MD  cefUROXime (CEFTIN) 250 MG tablet Take 1 tablet (250 mg total) by mouth 2 (two) times daily with a meal. 10/09/15  Yes Donne Hazel, MD  ferrous sulfate 325 (65 FE) MG tablet Take 325 mg by mouth daily with breakfast.   Yes Historical Provider, MD  insulin aspart (NOVOLOG) 100  UNIT/ML injection Inject 0-8 Units into the skin 3 (three) times daily before meals. Sliding scale   Yes Historical Provider, MD  isosorbide-hydrALAZINE (BIDIL) 20-37.5 MG tablet Take 1 tablet by mouth 2 (two) times daily. 07/03/15  Yes Shanker Kristeen Mans, MD  LEVEMIR FLEXTOUCH 100 UNIT/ML Pen Inject 10 Units into the muscle at bedtime.  01/16/15  Yes Historical Provider, MD  loratadine (CLARITIN) 10 MG tablet Take 10 mg by mouth daily as needed for allergies.   Yes Historical Provider, MD  metolazone (ZAROXOLYN) 2.5 MG tablet Take 1 tablet (2.5 mg total) by mouth every 3 (three) days. 07/03/15  Yes Shanker Kristeen Mans, MD  multivitamin (RENA-VIT) TABS tablet Take 1 tablet by mouth daily. 01/24/15  Yes Historical Provider, MD  omeprazole (PRILOSEC) 20 MG capsule Take 20 mg by mouth daily.   Yes Historical Provider, MD  potassium chloride SA (K-DUR,KLOR-CON) 20 MEQ tablet Take 1 tablet (20 mEq total) by mouth 2 (two) times daily. 06/17/15  Yes Thurnell Lose, MD  torsemide (DEMADEX) 100 MG tablet Take 1 tablet (100 mg total) by mouth daily. 06/17/15  Yes Thurnell Lose, MD    I have reviewed the patient's current medications.  Labs:  Results for orders placed or performed during the hospital encounter of 10/09/15 (from the past 48 hour(s))  CBC with Differential     Status: Abnormal   Collection Time: 10/09/15  7:52 PM  Result Value Ref Range   WBC 7.6 4.0 - 10.5 K/uL   RBC 3.40 (L) 3.87 - 5.11 MIL/uL   Hemoglobin 10.3 (L) 12.0 - 15.0 g/dL   HCT 31.6 (L) 36.0 - 46.0 %   MCV 92.9 78.0 - 100.0 fL   MCH 30.3 26.0 - 34.0 pg   MCHC 32.6 30.0 - 36.0 g/dL   RDW 12.2 11.5 - 15.5 %   Platelets 287 150 - 400 K/uL   Neutrophils Relative % 62 %   Neutro Abs 4.7 1.7 - 7.7 K/uL   Lymphocytes Relative 29 %   Lymphs Abs 2.2 0.7 - 4.0 K/uL   Monocytes Relative 5 %  Monocytes Absolute 0.4 0.1 - 1.0 K/uL   Eosinophils Relative 4 %   Eosinophils Absolute 0.3 0.0 - 0.7 K/uL   Basophils Relative 0 %    Basophils Absolute 0.0 0.0 - 0.1 K/uL  Comprehensive metabolic panel     Status: Abnormal   Collection Time: 10/09/15  7:52 PM  Result Value Ref Range   Sodium 132 (L) 135 - 145 mmol/L   Potassium 5.0 3.5 - 5.1 mmol/L    Comment: NO VISIBLE HEMOLYSIS   Chloride 93 (L) 101 - 111 mmol/L   CO2 25 22 - 32 mmol/L   Glucose, Bld 206 (H) 65 - 99 mg/dL   BUN 68 (H) 6 - 20 mg/dL   Creatinine, Ser 4.18 (H) 0.44 - 1.00 mg/dL   Calcium 9.5 8.9 - 10.3 mg/dL   Total Protein 7.5 6.5 - 8.1 g/dL   Albumin 3.3 (L) 3.5 - 5.0 g/dL   AST 27 15 - 41 U/L   ALT 25 14 - 54 U/L   Alkaline Phosphatase 137 (H) 38 - 126 U/L   Total Bilirubin 0.6 0.3 - 1.2 mg/dL   GFR calc non Af Amer 10 (L) >60 mL/min   GFR calc Af Amer 12 (L) >60 mL/min    Comment: (NOTE) The eGFR has been calculated using the CKD EPI equation. This calculation has not been validated in all clinical situations. eGFR's persistently <60 mL/min signify possible Chronic Kidney Disease.    Anion gap 14 5 - 15  Brain natriuretic peptide     Status: Abnormal   Collection Time: 10/09/15  7:52 PM  Result Value Ref Range   B Natriuretic Peptide 966.4 (H) 0.0 - 100.0 pg/mL  I-Stat Troponin, ED - 0, 3, 6 hours (not at [name])     Status: None   Collection Time: 10/09/15  8:02 PM  Result Value Ref Range   Troponin i, poc 0.04 0.00 - 0.08 ng/mL   Comment 3            Comment: Due to the release kinetics of cTnI, a negative result within the first hours of the onset of symptoms does not rule out myocardial infarction with certainty. If myocardial infarction is still suspected, repeat the test at appropriate intervals.   I-Stat CG4 Lactic Acid, ED     Status: None   Collection Time: 10/09/15  8:04 PM  Result Value Ref Range   Lactic Acid, Venous 0.86 0.5 - 2.0 mmol/L  I-Stat Arterial Blood Gas, ED - (order at [facility] and [facility] only)     Status: Abnormal   Collection Time: 10/09/15  8:31 PM  Result Value Ref Range   pH, Arterial 7.387 7.350 - 7.450    pCO2 arterial 49.3 (H) 35.0 - 45.0 mmHg   pO2, Arterial 183.0 (H) 80.0 - 100.0 mmHg   Bicarbonate 29.7 (H) 20.0 - 24.0 mEq/L   TCO2 31 0 - 100 mmol/L   O2 Saturation 100.0 %   Acid-Base Excess 4.0 (H) 0.0 - 2.0 mmol/L   Patient temperature 98.0 F    Collection site RADIAL, ALLEN'S TEST ACCEPTABLE    Drawn by Operator    Sample type ARTERIAL   I-Stat Troponin, ED - 0, 3, 6 hours (not at [facility])     Status: None   Collection Time: 10/09/15 11:19 PM  Result Value Ref Range   Troponin i, poc 0.04 0.00 - 0.08 ng/mL   Comment 3            Comment:  Due to the release kinetics of cTnI, a negative result within the first hours of the onset of symptoms does not rule out myocardial infarction with certainty. If myocardial infarction is still suspected, repeat the test at appropriate intervals.   I-Stat CG4 Lactic Acid, ED     Status: None   Collection Time: 10/09/15 11:21 PM  Result Value Ref Range   Lactic Acid, Venous 1.25 0.5 - 2.0 mmol/L  Troponin I (q 6hr x 3)     Status: Abnormal   Collection Time: 10/10/15  1:25 AM  Result Value Ref Range   Troponin I 0.04 (H) <0.031 ng/mL    Comment:        PERSISTENTLY INCREASED TROPONIN VALUES IN THE RANGE OF 0.04-0.49 ng/mL CAN BE SEEN IN:       -UNSTABLE ANGINA       -CONGESTIVE HEART FAILURE       -MYOCARDITIS       -CHEST TRAUMA       -ARRYHTHMIAS       -LATE PRESENTING MYOCARDIAL INFARCTION       -COPD   CLINICAL FOLLOW-UP RECOMMENDED.   CBC     Status: Abnormal   Collection Time: 10/10/15  1:25 AM  Result Value Ref Range   WBC 8.5 4.0 - 10.5 K/uL   RBC 3.18 (L) 3.87 - 5.11 MIL/uL   Hemoglobin 9.6 (L) 12.0 - 15.0 g/dL   HCT 29.4 (L) 36.0 - 46.0 %   MCV 92.5 78.0 - 100.0 fL   MCH 30.2 26.0 - 34.0 pg   MCHC 32.7 30.0 - 36.0 g/dL   RDW 12.1 11.5 - 15.5 %   Platelets 258 150 - 400 K/uL  Creatinine, serum     Status: Abnormal   Collection Time: 10/10/15  1:25 AM  Result Value Ref Range   Creatinine, Ser 4.27 (H) 0.44 - 1.00 mg/dL    GFR calc non Af Amer 10 (L) >60 mL/min   GFR calc Af Amer 12 (L) >60 mL/min    Comment: (NOTE) The eGFR has been calculated using the CKD EPI equation. This calculation has not been validated in all clinical situations. eGFR's persistently <60 mL/min signify possible Chronic Kidney Disease.   Comprehensive metabolic panel     Status: Abnormal   Collection Time: 10/10/15  5:23 AM  Result Value Ref Range   Sodium 136 135 - 145 mmol/L   Potassium 4.4 3.5 - 5.1 mmol/L   Chloride 95 (L) 101 - 111 mmol/L   CO2 26 22 - 32 mmol/L   Glucose, Bld 183 (H) 65 - 99 mg/dL   BUN 74 (H) 6 - 20 mg/dL   Creatinine, Ser 4.35 (H) 0.44 - 1.00 mg/dL   Calcium 9.6 8.9 - 10.3 mg/dL   Total Protein 6.8 6.5 - 8.1 g/dL   Albumin 3.1 (L) 3.5 - 5.0 g/dL   AST 20 15 - 41 U/L   ALT 23 14 - 54 U/L   Alkaline Phosphatase 119 38 - 126 U/L   Total Bilirubin 0.5 0.3 - 1.2 mg/dL   GFR calc non Af Amer 10 (L) >60 mL/min   GFR calc Af Amer 11 (L) >60 mL/min    Comment: (NOTE) The eGFR has been calculated using the CKD EPI equation. This calculation has not been validated in all clinical situations. eGFR's persistently <60 mL/min signify possible Chronic Kidney Disease.    Anion gap 15 5 - 15  CBC WITH DIFFERENTIAL     Status: Abnormal  Collection Time: 10/10/15  5:23 AM  Result Value Ref Range   WBC 7.9 4.0 - 10.5 K/uL   RBC 3.18 (L) 3.87 - 5.11 MIL/uL   Hemoglobin 9.3 (L) 12.0 - 15.0 g/dL   HCT 29.4 (L) 36.0 - 46.0 %   MCV 92.5 78.0 - 100.0 fL   MCH 29.2 26.0 - 34.0 pg   MCHC 31.6 30.0 - 36.0 g/dL   RDW 12.1 11.5 - 15.5 %   Platelets 261 150 - 400 K/uL   Neutrophils Relative % 67 %   Neutro Abs 5.3 1.7 - 7.7 K/uL   Lymphocytes Relative 24 %   Lymphs Abs 1.9 0.7 - 4.0 K/uL   Monocytes Relative 6 %   Monocytes Absolute 0.5 0.1 - 1.0 K/uL   Eosinophils Relative 3 %   Eosinophils Absolute 0.2 0.0 - 0.7 K/uL   Basophils Relative 0 %   Basophils Absolute 0.0 0.0 - 0.1 K/uL  Troponin I     Status:  Abnormal   Collection Time: 10/10/15  5:23 AM  Result Value Ref Range   Troponin I 0.04 (H) <0.031 ng/mL    Comment:        PERSISTENTLY INCREASED TROPONIN VALUES IN THE RANGE OF 0.04-0.49 ng/mL CAN BE SEEN IN:       -UNSTABLE ANGINA       -CONGESTIVE HEART FAILURE       -MYOCARDITIS       -CHEST TRAUMA       -ARRYHTHMIAS       -LATE PRESENTING MYOCARDIAL INFARCTION       -COPD   CLINICAL FOLLOW-UP RECOMMENDED.      ROS:  A comprehensive review of systems was negative except for: Constitutional: positive for fatigue Respiratory: positive for dyspnea on exertion Gastrointestinal: positive for nausea  Physical Exam: Filed Vitals:   10/10/15 1130 10/10/15 1200  BP: 184/74 179/80  Pulse: 81 78  Temp:    Resp: 18 18     General: Fairly well appearing black female in mild distress, slightly anxious HEENT: Pupils are equal round reactive to light, extraocular motions are intact, mucous membranes are moist Neck: There is jugular venous distention Heart: Regular rate and rhythm without murmur Lungs: Some crackles at the bases. Prolonged expiratory phase Abdomen: Soft, nontender, nondistended Extremities: Trace peripheral edema Skin: Warm and dry Neuro: Alert and nonfocal Patent AV fistula in the right upper arm  Assessment/Plan: 65 year old black female with advanced CKD now with recent bounce back hospitalization most notably for pulmonary edema 1.Renal- patient with advanced CKD. GFR close to 10. It appears as if she's having uremic symptoms. This in addition to the difficulty that she's had with pulmonary edema/shortness of breath/hypertension tells me that it is time to initiate dialysis to try to get a handle on all of these issues. She is agreeable to this plan. Inpatient dialysis would not be able to do her first treatment until the evening time. There is no urgency-  will do first treatment in the morning where there is more people around. We'll plan for daily dialysis  Friday and Saturday and also start arrangements to find outpatient dialysis unit for her. Hopefully her fistula will be usable- we'll request nurse with experience to stick.  2. Hypertension/volume  - volume is definitely playing a part. Her ejection fraction was reasonable when checked just a few days ago. She is currently on Coreg, BiDil, torsemide and Zaroxolyn.  Currently requiring a nitro drip. Would continue her home meds tonight and  then once we initiate dialysis will discontinue the diuretics. As her volume gets under better control her other antihypertensives are likely to be able to be weaned.  I will give one big dose of lasix tonight and order UF of one liter with HD tomorrow 3. Anemia  - has been on ESA in the past. Currently had only been receiving iron when necessary. Will check iron stores and likely initiate both pending results  4. Secondary hyperparathyroidism- patient is on calcitriol as an outpatient with last PTH in the 200s. Calcitriol will be continued on her dialysis days  Thank you for this consultation. We will follow with you    Huey Scalia A 10/10/2015, 12:52 PM

## 2015-10-10 NOTE — ED Notes (Signed)
Pt came off bi-pap approx 1-2 hours ago  sats good resting.  Moved to a regular hosp bed for comfort

## 2015-10-10 NOTE — ED Notes (Signed)
Pt got up attempting to get the  Bedpan  And she accidentally voided in the floor  Up with no 02 and her stas were 98%.  No noticeable breathing difficulty    Pt cleaned up and back to bed

## 2015-10-10 NOTE — ED Notes (Signed)
Checked patient blood sugar it was 220 notified RN [place] of blood sugar

## 2015-10-10 NOTE — ED Notes (Signed)
attempted report 

## 2015-10-11 LAB — RENAL FUNCTION PANEL
Albumin: 2.9 g/dL — ABNORMAL LOW (ref 3.5–5.0)
Anion gap: 15 (ref 5–15)
BUN: 76 mg/dL — ABNORMAL HIGH (ref 6–20)
CO2: 23 mmol/L (ref 22–32)
Calcium: 9.3 mg/dL (ref 8.9–10.3)
Chloride: 95 mmol/L — ABNORMAL LOW (ref 101–111)
Creatinine, Ser: 4.28 mg/dL — ABNORMAL HIGH (ref 0.44–1.00)
GFR calc Af Amer: 12 mL/min — ABNORMAL LOW (ref >60)
GFR calc non Af Amer: 10 mL/min — ABNORMAL LOW (ref >60)
Glucose, Bld: 127 mg/dL — ABNORMAL HIGH (ref 65–99)
Phosphorus: 4.6 mg/dL (ref 2.5–4.6)
Potassium: 4.4 mmol/L (ref 3.5–5.1)
Sodium: 133 mmol/L — ABNORMAL LOW (ref 135–145)

## 2015-10-11 LAB — GLUCOSE, CAPILLARY
Glucose-Capillary: 84 mg/dL (ref 65–99)
Glucose-Capillary: 216 mg/dL — ABNORMAL HIGH (ref 65–99)
Glucose-Capillary: 102 mg/dL — ABNORMAL HIGH (ref 65–99)
Glucose-Capillary: 308 mg/dL — ABNORMAL HIGH (ref 65–99)

## 2015-10-11 LAB — IRON AND TIBC
Iron: 48 ug/dL (ref 28–170)
Saturation Ratios: 19 % (ref 10.4–31.8)
TIBC: 256 ug/dL (ref 250–450)
UIBC: 208 ug/dL

## 2015-10-11 LAB — HEPATITIS C ANTIBODY: HCV Ab: 0.3 s/co ratio (ref 0.0–0.9)

## 2015-10-11 LAB — HEPATITIS B SURFACE ANTIBODY,QUALITATIVE: Hep B S Ab: REACTIVE

## 2015-10-11 LAB — HEPATITIS B CORE ANTIBODY, TOTAL: Hep B Core Total Ab: NEGATIVE

## 2015-10-11 LAB — HEPATITIS B SURFACE ANTIGEN: Hepatitis B Surface Ag: NEGATIVE

## 2015-10-11 MED ORDER — ALPRAZOLAM 0.25 MG PO TABS
0.2500 mg | ORAL_TABLET | Freq: Once | ORAL | Status: AC
  Administered 2015-10-11: 0.25 mg via ORAL
  Filled 2015-10-11: qty 1

## 2015-10-11 MED ORDER — DARBEPOETIN ALFA 60 MCG/0.3ML IJ SOSY
60.0000 ug | PREFILLED_SYRINGE | INTRAMUSCULAR | Status: DC
  Administered 2015-10-11: 60 ug via INTRAVENOUS
  Filled 2015-10-11: qty 0.3

## 2015-10-11 MED ORDER — AMLODIPINE BESYLATE 5 MG PO TABS: 5.0000 mg | ORAL_TABLET | Freq: Every day | ORAL | Status: DC

## 2015-10-11 MED ORDER — HYDROCOD POLST-CPM POLST ER 10-8 MG/5ML PO SUER
5.0000 mL | Freq: Two times a day (BID) | ORAL | Status: DC | PRN
  Administered 2015-10-11: 5 mL via ORAL
  Filled 2015-10-11: qty 5

## 2015-10-11 MED ORDER — SODIUM CHLORIDE 0.9 % IV SOLN
125.0000 mg | INTRAVENOUS | Status: DC
  Administered 2015-10-11 – 2015-10-14 (×2): 125 mg via INTRAVENOUS
  Filled 2015-10-11 (×4): qty 10

## 2015-10-11 MED ORDER — DARBEPOETIN ALFA 60 MCG/0.3ML IJ SOSY
PREFILLED_SYRINGE | INTRAMUSCULAR | Status: AC
  Filled 2015-10-11: qty 0.3

## 2015-10-11 NOTE — Progress Notes (Signed)
[facility] PROGRESS NOTE  Destiny Day [id number] DOB: 1951-03-30 DOA: 10/09/2015 PCP: Velna Hatchet, MD   HPI: 65 year old female with a history of CKD, HTN, DM2 who was discharged home on 3/15 after being treated for acute pulmonary edema and hypertensive urgency presented to the ED after developing sudden onset of shortness of breath after arriving home. Returned to the ED on 3/15 with BP of 255/109, placed on nitro drip. Chest x-ray on presentation showed increased vascular congestion and interstitial edema, which is new from chest x-ray on 3/14 which showed resolved pulmonary edema. 2D echo on 3/15 showed grade 2 diastolic dysfunction with EF of 55-60%. Given IV lasix. Nephrology on consult, plan for dialysis today and Saturday. Pt had fistula placed in October 2016, but has not yet begun treatments.  Subjective: Reports feeling more short of breath during the night, while lying flat, but this has improved this morning.  Assessment/Plan:  Acute pulmonary edema secondary to hypertensive urgency - fluid overload, suspect related to CKD with impending need for dialysis - presented for same on 3/13, placed on nitro drip and IV Lasix at that time with resolution, then had acute return of symptoms upon arrival home after discharge - chest x-ray on presentation showed increased vascular congestion and interstitial edema, pt requiring supplemental oxygen - 2D echo on 3/15 showed EF of 55-60% with grade 2 diastolic dysfunction - placed on Lasix 60 mg IV BID and nitro drip with improvement, discontinued today, nephrology consulted and plan to dialyze today and tomorrow - continue to monitor O2 sats, BP  Hypertensive urgency  - presented for same on 3/13, likely volume related - BP of 255/109 on arrival, placed on nitro drip and IV Lasix with improvement, discontinued this today, nephrology consulted and will dialyze today and tomorrow  Acute respiratory failure with hypoxia -  due to the above, initially requiring 2-3 L of supplemental oxygen, after treatment of the above, now down to 1 L with sats 100% - will continue to monitor and gradually wean off of oxygen, suspect she will be back to baseline after dialysis treatments today and tomorrow - pt is not on home oxygen  Acute on chronic diastolic CHF - 2D echo on 3/15 showed EF of 55-60% with grade 2 diastolic dysfunction - pt on torsemide 100 mg daily and metolazone at home, presented with trace LE edema and pulmonary edema - given lasix 60 mg IV BID, discontinued now as pt has begun dialysis treatments - will continue to monitor daily weights, strict I & O, sodium restricted/renal diet  Questionable pneumonia - placed on azithromycin for questionable pneumonia on chest x-ray, discontinue antibiotics.  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  - AV fistula placed in October 2016, nephrology consulted and plan is to dialyze today and tomorrow - Creatinine 4.28, near baseline ~4.1  Chronic anemia from CKD  - Hgb 9.3, stable, will continue to monitor   Diabetes mellitus type 2  - on Levemir 10 units at bedtime, will likely need adjustment as Hgb A1C was 9.8 on 3/15 - sliding scale insulin while inpatient   Code Status: Full code Family Communication: Husband at bedside Disposition Plan: Admit   Consultants:  Nephrology  Procedures:  None  Antibiotics:  azithromycin   Objective: Filed Vitals:   10/11/15 0700 10/11/15 0730  BP: 187/78   Pulse: 77   Temp:  98 F (36.7 C)  Resp: 13     Intake/Output Summary (Last 24 hours) at 10/11/15 1402 Last data filed at  10/11/15 1300  Gross per 24 hour  Intake    633 ml  Output   1500 ml  Net   -867 ml   Filed Weights   10/10/15 1356 10/11/15 0600  Weight: 65.6 kg (144 lb 10 oz) 67.3 kg (148 lb 5.9 oz)    Exam:   General: Alert and oriented, in no acute distress  Cardiovascular: RRR  Respiratory: CTAB  Abdomen: Soft,  nontender  Musculoskeletal: No edema  Data Reviewed: Basic Metabolic Panel:  Recent Labs Lab 10/06/15 2348 10/07/15 0347 10/08/15 0427 10/09/15 1952 10/10/15 0125 10/10/15 0523 10/11/15 0720  NA 136  --  135 132*  --  136 133*  K 3.1*  --  3.9 5.0  --  4.4 4.4  CL 94*  --  94* 93*  --  95* 95*  CO2 26  --  28 25  --  26 23  GLUCOSE 250*  --  232* 206*  --  183* 127*  BUN 59*  --  68* 68*  --  74* 76*  CREATININE 3.78*  --  4.15* 4.18* 4.27* 4.35* 4.28*  CALCIUM 9.2  --  9.1 9.5  --  9.6 9.3  MG  --  1.7  --   --   --   --   --   PHOS  --   --   --   --   --   --  4.6   Liver Function Tests:  Recent Labs Lab 10/06/15 2348 10/08/15 0427 10/09/15 1952 10/10/15 0523 10/11/15 0720  AST 76* 26 27 20   --   ALT 51 29 25 23   --   ALKPHOS 169* 126 137* 119  --   BILITOT 0.8 0.6 0.6 0.5  --   PROT 7.5 6.7 7.5 6.8  --   ALBUMIN 3.4* 3.0* 3.3* 3.1* 2.9*   No results for input(s): LIPASE, AMYLASE in the last 168 hours. No results for input(s): AMMONIA in the last 168 hours. CBC:  Recent Labs Lab 10/06/15 2348 10/08/15 0427 10/09/15 1952 10/10/15 0125 10/10/15 0523  WBC 9.5 7.8 7.6 8.5 7.9  NEUTROABS 7.0  --  4.7  --  5.3  HGB 11.1* 9.3* 10.3* 9.6* 9.3*  HCT 34.0* 28.9* 31.6* 29.4* 29.4*  MCV 90.9 91.5 92.9 92.5 92.5  PLT 292 263 287 258 261   Cardiac Enzymes:  Recent Labs Lab 10/07/15 0735 10/07/15 1256 10/10/15 0125 10/10/15 0523 10/10/15 1312  TROPONINI 0.08* 0.06* 0.04* 0.04* 0.04*   BNP (last 3 results)  Recent Labs  06/30/15 2043 10/06/15 2348 10/09/15 1952  BNP 1532.5* 799.3* 966.4*    ProBNP (last 3 results) No results for input(s): PROBNP in the last 8760 hours.  CBG:  Recent Labs Lab 10/09/15 0555 10/09/15 1224 10/10/15 1331 10/10/15 1549 10/10/15 2123  GLUCAP 232* 202* 220* 203* 187*    Recent Results (from the past 240 hour(s))  MRSA PCR Screening     Status: None   Collection Time: 10/07/15  6:09 PM  Result Value Ref  Range Status   MRSA by PCR NEGATIVE NEGATIVE Final    Comment:        The GeneXpert MRSA Assay (FDA approved for NASAL specimens only), is one component of a comprehensive MRSA colonization surveillance program. It is not intended to diagnose MRSA infection nor to guide or monitor treatment for MRSA infections.      Studies: Dg Chest Port 1 View  10/09/2015  CLINICAL DATA:  Shortness of breath for  1 day EXAM: PORTABLE CHEST 1 VIEW COMPARISON:  10/08/2015 FINDINGS: Cardiac shadow is stable. Mild increased vascular congestion is again noted with interstitial edema. This has increased in the interval from the prior exam. Small pleural effusions are noted. No bony abnormality is seen. IMPRESSION: Increase in vascular congestion and interstitial edema. Electronically Signed   By: Inez Catalina M.D.   On: 10/09/2015 20:24    Scheduled Meds: . amLODipine  5 mg Oral Daily  . aspirin  81 mg Oral Daily  . atorvastatin  20 mg Oral q1800  . azithromycin  250 mg Oral Daily  . calcitRIOL  0.25 mcg Oral Daily  . carvedilol  25 mg Oral BID WC  . cefUROXime  250 mg Oral BID WC  . ferrous sulfate  325 mg Oral Q breakfast  . heparin  5,000 Units Subcutaneous 3 times per day  . insulin aspart  0-9 Units Subcutaneous TID WC  . insulin detemir  10 Units Subcutaneous QHS  . isosorbide-hydrALAZINE  1 tablet Oral BID  . metolazone  2.5 mg Oral Daily  . mometasone-formoterol  2 puff Inhalation BID  . multivitamin  1 tablet Oral Daily  . pantoprazole  40 mg Oral Daily  . potassium chloride SA  20 mEq Oral BID   Continuous Infusions:   Active Problems:   Hypertensive urgency   Pulmonary edema   Type 2 diabetes mellitus with diabetic nephropathy (HCC)   CKD (chronic kidney disease) stage 4, GFR 15-29 ml/min (HCC)   Acute pulmonary edema ([place])    Time spent: [place] PA-S [facility] Pager [phone]. If 7PM-7AM, please contact night-coverage at [email or web address], password  [facility] 10/11/2015, 9:16 AM  LOS: 2 days     Birdie Hopes Pager: [phone] 10/11/2015, 2:02 PM

## 2015-10-11 NOTE — Progress Notes (Signed)
Admit: 10/09/2015 LOS: 2  13F New ESRD, with HTN emergency and vol O/l  Subjective:  Off NTG gtt Good UOP For HD today, Tx #1 Questions answered with pt and husband Looks like will clip to [facility] To use RUE AVF   03/16 0701 - 03/17 0700 In: 31 [I.V.:33] Out: 1475 [Urine:1475]  Filed Weights   10/10/15 1356 10/11/15 0600  Weight: 65.6 kg (144 lb 10 oz) 67.3 kg (148 lb 5.9 oz)    Scheduled Meds: . amLODipine  5 mg Oral Daily  . aspirin  81 mg Oral Daily  . atorvastatin  20 mg Oral q1800  . azithromycin  250 mg Oral Daily  . calcitRIOL  0.25 mcg Oral Daily  . carvedilol  25 mg Oral BID WC  . cefUROXime  250 mg Oral BID WC  . ferrous sulfate  325 mg Oral Q breakfast  . heparin  5,000 Units Subcutaneous 3 times per day  . insulin aspart  0-9 Units Subcutaneous TID WC  . insulin detemir  10 Units Subcutaneous QHS  . isosorbide-hydrALAZINE  1 tablet Oral BID  . metolazone  2.5 mg Oral Daily  . mometasone-formoterol  2 puff Inhalation BID  . multivitamin  1 tablet Oral Daily  . pantoprazole  40 mg Oral Daily  . potassium chloride SA  20 mEq Oral BID   Continuous Infusions:  PRN Meds:.sodium chloride, sodium chloride, acetaminophen **OR** acetaminophen, albuterol, alteplase, heparin, heparin, lidocaine (PF), lidocaine-prilocaine, loratadine, ondansetron **OR** ondansetron (ZOFRAN) IV, pentafluoroprop-tetrafluoroeth  Current Labs: reviewed    Physical Exam:  Blood pressure 187/78, pulse 77, temperature 98 F (36.7 C), temperature source Oral, resp. rate 13, height 5\' 4"  (1.626 m), weight 67.3 kg (148 lb 5.9 oz), SpO2 100 %. NAD CTAB RRR no rub Trace LEE RUE AVF +B/T S/nt/nd NCAT EOMI  A 1. New ESRD with Uremia 2. HTN with emergency and vol overload 3. DM2 4. CHF 5. 2HPTH 6. Anemia TSAT 19% 3/17  P 1. HD#1 today 2. Start half Fe load 3. Begin ESA 100 qFri 4. Next HD#2 Sat, then Monday 3/20 5. Cont calcitriol 6. CLIP pt, [facility] to [facility] 7. Stop  metolazone 8. Amlodipine to qhs   Pearson Grippe MD 10/11/2015, 11:11 AM   Recent Labs Lab 10/09/15 1952 10/10/15 0125 10/10/15 0523 10/11/15 0720  NA 132*  --  136 133*  K 5.0  --  4.4 4.4  CL 93*  --  95* 95*  CO2 25  --  26 23  GLUCOSE 206*  --  183* 127*  BUN 68*  --  74* 76*  CREATININE 4.18* 4.27* 4.35* 4.28*  CALCIUM 9.5  --  9.6 9.3  PHOS  --   --   --  4.6    Recent Labs Lab 10/06/15 2348  10/09/15 1952 10/10/15 0125 10/10/15 0523  WBC 9.5  < > 7.6 8.5 7.9  NEUTROABS 7.0  --  4.7  --  5.3  HGB 11.1*  < > 10.3* 9.6* 9.3*  HCT 34.0*  < > 31.6* 29.4* 29.4*  MCV 90.9  < > 92.9 92.5 92.5  PLT 292  < > 287 258 261  < > = values in this interval not displayed.

## 2015-10-11 NOTE — Procedures (Signed)
I was present at this dialysis session. I have reviewed the session itself and made appropriate changes.   Tolerating.  Cannulation successful 17g x2.  For HD#2 tomorrow.    Pearson Grippe  MD 10/11/2015, 3:12 PM

## 2015-10-11 NOTE — Progress Notes (Signed)
UR COMPLETED  

## 2015-10-12 LAB — CBC WITH DIFFERENTIAL/PLATELET
Basophils Absolute: 0 10*3/uL (ref 0.0–0.1)
Basophils Relative: 0 %
Eosinophils Absolute: 0.2 10*3/uL (ref 0.0–0.7)
Eosinophils Relative: 3 %
HCT: 27.5 % — ABNORMAL LOW (ref 36.0–46.0)
Hemoglobin: 8.8 g/dL — ABNORMAL LOW (ref 12.0–15.0)
Lymphocytes Relative: 34 %
Lymphs Abs: 2.5 10*3/uL (ref 0.7–4.0)
MCH: 29.4 pg (ref 26.0–34.0)
MCHC: 32 g/dL (ref 30.0–36.0)
MCV: 92 fL (ref 78.0–100.0)
Monocytes Absolute: 0.9 10*3/uL (ref 0.1–1.0)
Monocytes Relative: 12 %
Neutro Abs: 3.8 10*3/uL (ref 1.7–7.7)
Neutrophils Relative %: 51 %
Platelets: 252 10*3/uL (ref 150–400)
RBC: 2.99 MIL/uL — ABNORMAL LOW (ref 3.87–5.11)
RDW: 12.4 % (ref 11.5–15.5)
WBC: 7.4 10*3/uL (ref 4.0–10.5)

## 2015-10-12 LAB — GLUCOSE, CAPILLARY
Glucose-Capillary: 111 mg/dL — ABNORMAL HIGH (ref 65–99)
Glucose-Capillary: 236 mg/dL — ABNORMAL HIGH (ref 65–99)
Glucose-Capillary: 98 mg/dL (ref 65–99)

## 2015-10-12 LAB — CBC
HCT: 25.8 % — ABNORMAL LOW (ref 36.0–46.0)
Hemoglobin: 8.5 g/dL — ABNORMAL LOW (ref 12.0–15.0)
MCH: 30.1 pg (ref 26.0–34.0)
MCHC: 32.9 g/dL (ref 30.0–36.0)
MCV: 91.5 fL (ref 78.0–100.0)
Platelets: 237 10*3/uL (ref 150–400)
RBC: 2.82 MIL/uL — ABNORMAL LOW (ref 3.87–5.11)
RDW: 12.1 % (ref 11.5–15.5)
WBC: 6.8 10*3/uL (ref 4.0–10.5)

## 2015-10-12 LAB — RENAL FUNCTION PANEL
Albumin: 2.9 g/dL — ABNORMAL LOW (ref 3.5–5.0)
Anion gap: 12 (ref 5–15)
BUN: 40 mg/dL — ABNORMAL HIGH (ref 6–20)
CO2: 27 mmol/L (ref 22–32)
Calcium: 9.4 mg/dL (ref 8.9–10.3)
Chloride: 97 mmol/L — ABNORMAL LOW (ref 101–111)
Creatinine, Ser: 3.48 mg/dL — ABNORMAL HIGH (ref 0.44–1.00)
GFR calc Af Amer: 15 mL/min — ABNORMAL LOW (ref >60)
GFR calc non Af Amer: 13 mL/min — ABNORMAL LOW (ref >60)
Glucose, Bld: 194 mg/dL — ABNORMAL HIGH (ref 65–99)
Phosphorus: 3.4 mg/dL (ref 2.5–4.6)
Potassium: 4.8 mmol/L (ref 3.5–5.1)
Sodium: 136 mmol/L (ref 135–145)

## 2015-10-12 LAB — PARATHYROID HORMONE, INTACT (NO CA): PTH: 166 pg/mL — ABNORMAL HIGH (ref 15–65)

## 2015-10-12 MED ORDER — AMLODIPINE BESYLATE 10 MG PO TABS
10.0000 mg | ORAL_TABLET | Freq: Every day | ORAL | Status: DC
  Administered 2015-10-12 – 2015-10-14 (×3): 10 mg via ORAL
  Filled 2015-10-12 (×3): qty 1

## 2015-10-12 NOTE — Progress Notes (Signed)
Admit: 10/09/2015 LOS: 3  38F New ESRD, with HTN emergency and vol O/l  Subjective:  HD#1 yesterday, 2.5h, 1.1L UF, used AVF 17g x2 BP remains up today For HD#2 today Feels well Questions answered from pt/husband  03/17 0701 - 03/18 0700 In: 1200 [P.O.:1200] Out: 2102 [Urine:1000]  Filed Weights   10/11/15 1450 10/11/15 1720 10/12/15 0610  Weight: 63.6 kg (140 lb 3.4 oz) 62.1 kg (136 lb 14.5 oz) 64.411 kg (142 lb)    Scheduled Meds: . amLODipine  5 mg Oral QHS  . aspirin  81 mg Oral Daily  . atorvastatin  20 mg Oral q1800  . calcitRIOL  0.25 mcg Oral Daily  . carvedilol  25 mg Oral BID WC  . darbepoetin (ARANESP) injection - DIALYSIS  60 mcg Intravenous Q Fri-HD  . ferric gluconate (FERRLECIT/NULECIT) IV  125 mg Intravenous Q M,W,F-HD  . ferrous sulfate  325 mg Oral Q breakfast  . heparin  5,000 Units Subcutaneous 3 times per day  . insulin aspart  0-9 Units Subcutaneous TID WC  . insulin detemir  10 Units Subcutaneous QHS  . isosorbide-hydrALAZINE  1 tablet Oral BID  . mometasone-formoterol  2 puff Inhalation BID  . multivitamin  1 tablet Oral Daily  . pantoprazole  40 mg Oral Daily  . potassium chloride SA  20 mEq Oral BID   Continuous Infusions:  PRN Meds:.acetaminophen **OR** acetaminophen, albuterol, chlorpheniramine-HYDROcodone, loratadine, ondansetron **OR** ondansetron (ZOFRAN) IV  Current Labs: reviewed    Physical Exam:  Blood pressure 192/70, pulse 76, temperature 98.4 F (36.9 C), temperature source Oral, resp. rate 16, height 5\' 4"  (1.626 m), weight 64.411 kg (142 lb), SpO2 99 %. NAD CTAB RRR no rub Trace LEE RUE AVF +B/T S/nt/nd NCAT EOMI  A 1. New ESRD with Uremia 2. HTN with emergency and vol overload 3. DM2 4. CHF 5. 2HPTH on VDRA, Phos ok right now 6. Anemia TSAT 19% 3/17 on qFri ESA and Fe Load  P 1. HD#2 today, 2L UF 2. Inc amlodipine to 10mg  qhs 3. More UF for BP control 4. Cont calcitriol 5. CLIP pt, likely to [facility] -- strong  preference for MWF given husbands work schedule    Pearson Grippe MD 10/12/2015, 9:17 AM   Recent Labs Lab 10/09/15 1952 10/10/15 0125 10/10/15 0523 10/11/15 0720  NA 132*  --  136 133*  K 5.0  --  4.4 4.4  CL 93*  --  95* 95*  CO2 25  --  26 23  GLUCOSE 206*  --  183* 127*  BUN 68*  --  74* 76*  CREATININE 4.18* 4.27* 4.35* 4.28*  CALCIUM 9.5  --  9.6 9.3  PHOS  --   --   --  4.6    Recent Labs Lab 10/06/15 2348  10/09/15 1952 10/10/15 0125 10/10/15 0523 10/12/15 0522  WBC 9.5  < > 7.6 8.5 7.9 6.8  NEUTROABS 7.0  --  4.7  --  5.3  --   HGB 11.1*  < > 10.3* 9.6* 9.3* 8.5*  HCT 34.0*  < > 31.6* 29.4* 29.4* 25.8*  MCV 90.9  < > 92.9 92.5 92.5 91.5  PLT 292  < > 287 258 261 237  < > = values in this interval not displayed.

## 2015-10-12 NOTE — Progress Notes (Signed)
[facility] PROGRESS NOTE  KHALESSI SCHLARB [id number] DOB: Jun 17, 1951 DOA: 10/09/2015 PCP: Velna Hatchet, MD   HPI: 65 year old female with a history of CKD, HTN, DM2 who was discharged home on 3/15 after being treated for acute pulmonary edema and hypertensive urgency presented to the ED after developing sudden onset of shortness of breath after arriving home. Returned to the ED on 3/15 with BP of 255/109, placed on nitro drip. Chest x-ray on presentation showed increased vascular congestion and interstitial edema, which is new from chest x-ray on 3/14 which showed resolved pulmonary edema. 2D echo on 3/15 showed grade 2 diastolic dysfunction with EF of 55-60%. Given IV lasix. Nephrology on consult, plan for dialysis today and Saturday. Pt had fistula placed in October 2016, but has not yet begun treatments.  Subjective: Had first session of dialysis yesterday, feeling much better less cough and SOB at night. Started the "CLIP" process, dialysis again today.  Assessment/Plan:  Acute pulmonary edema secondary to hypertensive urgency - fluid overload, suspect related to CKD with impending need for dialysis - presented for same on 3/13, placed on nitro drip and IV Lasix at that time with resolution, then had acute return of symptoms upon arrival home after discharge - chest x-ray on presentation showed increased vascular congestion and interstitial edema, pt requiring supplemental oxygen - 2D echo on 3/15 showed EF of 55-60% with grade 2 diastolic dysfunction - Started on dialysis, hopefully better volume status will help with pulmonary edema and blood pressure control.  Hypertensive urgency  - presented for same on 3/13, likely volume related - BP of 255/109 on arrival, was on nitroglycerin drip on admission, discontinued now. -On oral blood pressure medications, likely she will need less antihypertensive as dialysis takes place.  Acute respiratory failure with hypoxia - due  to the above, initially requiring 2-3 L of supplemental oxygen, after treatment of the above, now down to 1 L with sats 100% - will continue to monitor and gradually wean off of oxygen, suspect she will be back to baseline after dialysis treatments today and tomorrow - pt is not on home oxygen  Acute on chronic diastolic CHF - 2D echo on 3/15 showed EF of 55-60% with grade 2 diastolic dysfunction - pt on torsemide 100 mg daily and metolazone at home, presented with trace LE edema and pulmonary edema - given lasix 60 mg IV BID, discontinued now as pt has begun dialysis treatments - will continue to monitor daily weights, strict I & O, sodium restricted/renal diet  Questionable pneumonia - placed on azithromycin for questionable pneumonia on chest x-ray, discontinue antibiotics.  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  Diabetes mellitus type 2  - on Levemir 10 units at bedtime, will likely need adjustment as Hgb A1C was 9.8 on 3/15 - sliding scale insulin while inpatient   Code Status: Full code Family Communication: Husband at bedside Disposition Plan: Admit   Consultants:  Nephrology  Procedures:  None  Antibiotics:  azithromycin   Objective: Filed Vitals:   10/12/15 0942 10/12/15 1131  BP: 165/95 138/61  Pulse: 85 77  Temp:  98.5 F (36.9 C)  Resp: 20 16    Intake/Output Summary (Last 24 hours) at 10/12/15 1215 Last data filed at 10/12/15 0900  Gross per  24 hour  Intake    960 ml  Output   1752 ml  Net   -792 ml   Filed Weights   10/11/15 1450 10/11/15 1720 10/12/15 0610  Weight: 63.6 kg (140 lb 3.4 oz) 62.1 kg (136 lb 14.5 oz) 64.411 kg (142 lb)    Exam:   General: Alert and oriented, in no acute distress  Cardiovascular:  RRR  Respiratory: CTAB  Abdomen: Soft, nontender  Musculoskeletal: No edema  Data Reviewed: Basic Metabolic Panel:  Recent Labs Lab 10/06/15 2348 10/07/15 0347 10/08/15 0427 10/09/15 1952 10/10/15 0125 10/10/15 0523 10/11/15 0720  NA 136  --  135 132*  --  136 133*  K 3.1*  --  3.9 5.0  --  4.4 4.4  CL 94*  --  94* 93*  --  95* 95*  CO2 26  --  28 25  --  26 23  GLUCOSE 250*  --  232* 206*  --  183* 127*  BUN 59*  --  68* 68*  --  74* 76*  CREATININE 3.78*  --  4.15* 4.18* 4.27* 4.35* 4.28*  CALCIUM 9.2  --  9.1 9.5  --  9.6 9.3  MG  --  1.7  --   --   --   --   --   PHOS  --   --   --   --   --   --  4.6   Liver Function Tests:  Recent Labs Lab 10/06/15 2348 10/08/15 0427 10/09/15 1952 10/10/15 0523 10/11/15 0720  AST 76* 26 27 20   --   ALT 51 29 25 23   --   ALKPHOS 169* 126 137* 119  --   BILITOT 0.8 0.6 0.6 0.5  --   PROT 7.5 6.7 7.5 6.8  --   ALBUMIN 3.4* 3.0* 3.3* 3.1* 2.9*   No results for input(s): LIPASE, AMYLASE in the last 168 hours. No results for input(s): AMMONIA in the last 168 hours. CBC:  Recent Labs Lab 10/06/15 2348 10/08/15 0427 10/09/15 1952 10/10/15 0125 10/10/15 0523 10/12/15 0522  WBC 9.5 7.8 7.6 8.5 7.9 6.8  NEUTROABS 7.0  --  4.7  --  5.3  --   HGB 11.1* 9.3* 10.3* 9.6* 9.3* 8.5*  HCT 34.0* 28.9* 31.6* 29.4* 29.4* 25.8*  MCV 90.9 91.5 92.9 92.5 92.5 91.5  PLT 292 263 287 258 261 237   Cardiac Enzymes:  Recent Labs Lab 10/07/15 0735 10/07/15 1256 10/10/15 0125 10/10/15 0523 10/10/15 1312  TROPONINI 0.08* 0.06* 0.04* 0.04* 0.04*   BNP (last 3 results)  Recent Labs  06/30/15 2043 10/06/15 2348 10/09/15 1952  BNP 1532.5* 799.3* 966.4*    ProBNP (last 3 results) No results for input(s): PROBNP in the last 8760 hours.  CBG:  Recent Labs Lab 10/11/15 1111 10/11/15 1832 10/11/15 2246 10/12/15 0826 10/12/15 1129  GLUCAP 216* 102* 308* 111* 236*    Recent Results (from the past 240 hour(s))  MRSA PCR  Screening     Status: None   Collection Time: 10/07/15  6:09 PM  Result Value Ref Range Status   MRSA by PCR NEGATIVE NEGATIVE Final    Comment:        The GeneXpert MRSA Assay (FDA approved for NASAL specimens only), is one component of a comprehensive MRSA colonization surveillance program. It is not intended to diagnose MRSA infection nor to guide or monitor treatment for MRSA infections.      Studies: No results found.  Scheduled Meds: . amLODipine  10 mg Oral QHS  . aspirin  81 mg Oral Daily  . atorvastatin  20 mg Oral q1800  . calcitRIOL  0.25 mcg Oral Daily  . carvedilol  25 mg Oral BID WC  . darbepoetin (ARANESP) injection - DIALYSIS  60 mcg Intravenous Q Fri-HD  . ferric gluconate (FERRLECIT/NULECIT) IV  125 mg Intravenous Q M,W,F-HD  . ferrous sulfate  325 mg Oral Q breakfast  . heparin  5,000 Units Subcutaneous 3 times per day  . insulin aspart  0-9 Units Subcutaneous TID WC  . insulin detemir  10 Units Subcutaneous QHS  . isosorbide-hydrALAZINE  1 tablet Oral BID  . mometasone-formoterol  2 puff Inhalation BID  . multivitamin  1 tablet Oral Daily  . pantoprazole  40 mg Oral Daily  . potassium chloride SA  20 mEq Oral BID   Continuous Infusions:   Active Problems:   Hypertensive urgency   Pulmonary edema   Type 2 diabetes mellitus with diabetic nephropathy (HCC)   CKD (chronic kidney disease) stage 4, GFR 15-29 ml/min (HCC)   Acute pulmonary edema ([place])    Time spent: [place] A PA-S [facility] Pager [phone]. If 7PM-7AM, please contact night-coverage at [email or web address], password [facility] 10/12/2015, 12:15 PM  LOS: 3 days     Birdie Hopes Pager: [phone] 10/12/2015, 12:15 PM

## 2015-10-12 NOTE — Progress Notes (Signed)
Spoke with HD nurse and gave report, pt will have HD then transfer to [place]. Called [place] RN on [place] and gave report. Pt taken off telemetry to go to HD then pt will transfer to med surg. Consuelo Pandy RN

## 2015-10-13 LAB — GLUCOSE, CAPILLARY
Glucose-Capillary: 159 mg/dL — ABNORMAL HIGH (ref 65–99)
Glucose-Capillary: 161 mg/dL — ABNORMAL HIGH (ref 65–99)
Glucose-Capillary: 159 mg/dL — ABNORMAL HIGH (ref 65–99)
Glucose-Capillary: 235 mg/dL — ABNORMAL HIGH (ref 65–99)
Glucose-Capillary: 169 mg/dL — ABNORMAL HIGH (ref 65–99)

## 2015-10-13 LAB — PARATHYROID HORMONE, INTACT (NO CA): PTH: 153 pg/mL — ABNORMAL HIGH (ref 15–65)

## 2015-10-13 MED ORDER — CALCITRIOL 0.5 MCG PO CAPS
0.5000 ug | ORAL_CAPSULE | ORAL | Status: DC
  Administered 2015-10-14: 0.5 ug via ORAL
  Filled 2015-10-13: qty 1

## 2015-10-13 NOTE — Progress Notes (Signed)
Dr. Hartford Poli notified of patient feeling dizzy. VSS. CBG 161. Will continue to monitor

## 2015-10-13 NOTE — Progress Notes (Signed)
Admit: 10/09/2015 LOS: 4  40F New ESRD, with HTN emergency and vol O/l  Subjective:  HD#2 yesterday, 3h, 2K, 2.5Ca, 2L UF, post weight 62.4kg BP improved this AM, rec higher dose CCB Questions from patient and husband answered  03/18 0701 - 03/19 0700 In: 240 [P.O.:240] Out: 2000   Filed Weights   10/12/15 0610 10/12/15 1550 10/12/15 1858  Weight: 64.411 kg (142 lb) 65 kg (143 lb 4.8 oz) 62.4 kg (137 lb 9.1 oz)    Scheduled Meds: . amLODipine  10 mg Oral QHS  . aspirin  81 mg Oral Daily  . atorvastatin  20 mg Oral q1800  . calcitRIOL  0.25 mcg Oral Daily  . carvedilol  25 mg Oral BID WC  . darbepoetin (ARANESP) injection - DIALYSIS  60 mcg Intravenous Q Fri-HD  . ferric gluconate (FERRLECIT/NULECIT) IV  125 mg Intravenous Q M,W,F-HD  . ferrous sulfate  325 mg Oral Q breakfast  . heparin  5,000 Units Subcutaneous 3 times per day  . insulin aspart  0-9 Units Subcutaneous TID WC  . insulin detemir  10 Units Subcutaneous QHS  . isosorbide-hydrALAZINE  1 tablet Oral BID  . mometasone-formoterol  2 puff Inhalation BID  . multivitamin  1 tablet Oral Daily  . pantoprazole  40 mg Oral Daily  . potassium chloride SA  20 mEq Oral BID   Continuous Infusions:  PRN Meds:.acetaminophen **OR** acetaminophen, albuterol, chlorpheniramine-HYDROcodone, loratadine, ondansetron **OR** ondansetron (ZOFRAN) IV  Current Labs: reviewed    Physical Exam:  Blood pressure 147/63, pulse 75, temperature 98 F (36.7 C), temperature source Oral, resp. rate 19, height 5\' 4"  (1.626 m), weight 62.4 kg (137 lb 9.1 oz), SpO2 98 %. NAD CTAB RRR no rub Trace LEE RUE AVF +B/T S/nt/nd NCAT EOMI  A 1. New ESRD with Uremia 2. HTN with emergency and vol overload 3. DM2 4. CHF 5. 2HPTH on VDRA, Phos ok right now 6. Anemia TSAT 19% 3/17 on qFri ESA and Fe Load 7. 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 HD#3 3/20 1. 2K, 2.5 Ca 2. No heparin 3. 2L UF attempt 4. 3.5h 5. 17g x2 AVF 2. Stop KCL  BID 3. Cont calcitriol but will provide at outpt HD, not to cont at home 4. Will not resume diuretics upon discharge 5. Await CLIP, likely to [facility] -- strong preference for MWF given husbands work schedule    Pearson Grippe MD 10/13/2015, 8:46 AM   Recent Labs Lab 10/10/15 0523 10/11/15 0720 10/12/15 1601  NA 136 133* 136  K 4.4 4.4 4.8  CL 95* 95* 97*  CO2 26 23 27   GLUCOSE 183* 127* 194*  BUN 74* 76* 40*  CREATININE 4.35* 4.28* 3.48*  CALCIUM 9.6 9.3 9.4  PHOS  --  4.6 3.4    Recent Labs Lab 10/09/15 1952  10/10/15 0523 10/12/15 0522 10/12/15 1600  WBC 7.6  < > 7.9 6.8 7.4  NEUTROABS 4.7  --  5.3  --  3.8  HGB 10.3*  < > 9.3* 8.5* 8.8*  HCT 31.6*  < > 29.4* 25.8* 27.5*  MCV 92.9  < > 92.5 91.5 92.0  PLT 287  < > 261 237 252  < > = values in this interval not displayed.

## 2015-10-13 NOTE — Progress Notes (Signed)
[facility] PROGRESS NOTE  Destiny Day [id number] DOB: Jul 07, 1951 DOA: 10/09/2015 PCP: Velna Hatchet, MD   HPI: 65 year old female with a history of CKD, HTN, DM2 who was discharged home on 3/15 after being treated for acute pulmonary edema and hypertensive urgency presented to the ED after developing sudden onset of shortness of breath after arriving home. Returned to the ED on 3/15 with BP of 255/109, placed on nitro drip. Chest x-ray on presentation showed increased vascular congestion and interstitial edema, which is new from chest x-ray on 3/14 which showed resolved pulmonary edema. 2D echo on 3/15 showed grade 2 diastolic dysfunction with EF of 55-60%. Given IV lasix. Nephrology on consult, plan for dialysis today and Saturday. Pt had fistula placed in October 2016, but has not yet begun treatments.  Subjective: Has had 2 dialysis sessions since admission. Feeling much better in terms of shortness of breath, BP still in the high side will follow nephrology recommendations.  Assessment/Plan:  Acute pulmonary edema secondary to hypertensive urgency - fluid overload, suspect related to CKD with impending need for dialysis - presented for same on 3/13, placed on nitro drip and IV Lasix at that time with resolution, then had acute return of symptoms upon arrival home after discharge - chest x-ray on presentation showed increased vascular congestion and interstitial edema, pt requiring supplemental oxygen - 2D echo on 3/15 showed EF of 55-60% with grade 2 diastolic dysfunction - Started on dialysis, hopefully better volume status will help with pulmonary edema and blood pressure control.  Hypertensive urgency  - presented for same on 3/13, likely volume related - BP of 255/109 on arrival, was on nitroglycerin drip on admission, discontinued now. -On oral blood pressure medications, likely she will need less antihypertensive as dialysis takes place.  Acute respiratory  failure with hypoxia - due to the above, initially requiring 2-3 L of supplemental oxygen, after treatment of the above, now on RA. - This is resolved.  Acute on chronic diastolic CHF - 2D echo on 3/15 showed EF of 55-60% with grade 2 diastolic dysfunction - will continue to monitor daily weights, strict I & O, sodium restricted/renal diet. - This is improved after dialysis, no shortness of breath.  Questionable pneumonia - placed on azithromycin for questionable pneumonia on chest x-ray, discontinue antibiotics.  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  Diabetes mellitus type 2  - on Levemir 10 units at bedtime, will likely need adjustment as Hgb A1C was 9.8 on 3/15 - sliding scale insulin while inpatient   Code Status: Full code Family Communication: Husband at bedside Disposition Plan: Admit   Consultants:  Nephrology  Procedures:  None  Antibiotics:  azithromycin   Objective: Filed Vitals:   10/13/15 0959 10/13/15 1000  BP: 142/61 151/62  Pulse:    Temp:    Resp:      Intake/Output Summary (Last 24 hours) at 10/13/15 1023 Last data filed at 10/13/15 0900  Gross per 24 hour  Intake    240 ml  Output   2000 ml  Net  -1760 ml   Filed Weights   10/12/15 0610 10/12/15 1550 10/12/15 1858  Weight: 64.411 kg (142 lb) 65 kg (143 lb 4.8 oz) 62.4 kg (137 lb 9.1 oz)    Exam:  General: Alert and oriented, in no acute distress  Cardiovascular: RRR  Respiratory: CTAB  Abdomen: Soft, nontender  Musculoskeletal: No edema  Data Reviewed: Basic Metabolic Panel:  Recent Labs Lab 10/07/15 0347 10/08/15 0427 10/09/15 1952 10/10/15 0125 10/10/15 0523 10/11/15 0720 10/12/15 1601  NA  --  135 132*  --  136 133* 136  K  --  3.9 5.0   --  4.4 4.4 4.8  CL  --  94* 93*  --  95* 95* 97*  CO2  --  28 25  --  26 23 27   GLUCOSE  --  232* 206*  --  183* 127* 194*  BUN  --  68* 68*  --  74* 76* 40*  CREATININE  --  4.15* 4.18* 4.27* 4.35* 4.28* 3.48*  CALCIUM  --  9.1 9.5  --  9.6 9.3 9.4  MG 1.7  --   --   --   --   --   --   PHOS  --   --   --   --   --  4.6 3.4   Liver Function Tests:  Recent Labs Lab 10/06/15 2348 10/08/15 0427 10/09/15 1952 10/10/15 0523 10/11/15 0720 10/12/15 1601  AST 76* 26 27 20   --   --   ALT 51 29 25 23   --   --   ALKPHOS 169* 126 137* 119  --   --   BILITOT 0.8 0.6 0.6 0.5  --   --   PROT 7.5 6.7 7.5 6.8  --   --   ALBUMIN 3.4* 3.0* 3.3* 3.1* 2.9* 2.9*   No results for input(s): LIPASE, AMYLASE in the last 168 hours. No results for input(s): AMMONIA in the last 168 hours. CBC:  Recent Labs Lab 10/06/15 2348  10/09/15 1952 10/10/15 0125 10/10/15 0523 10/12/15 0522 10/12/15 1600  WBC 9.5  < > 7.6 8.5 7.9 6.8 7.4  NEUTROABS 7.0  --  4.7  --  5.3  --  3.8  HGB 11.1*  < > 10.3* 9.6* 9.3* 8.5* 8.8*  HCT 34.0*  < > 31.6* 29.4* 29.4* 25.8* 27.5*  MCV 90.9  < > 92.9 92.5 92.5 91.5 92.0  PLT 292  < > 287 258 261 237 252  < > = values in this interval not displayed. Cardiac Enzymes:  Recent Labs Lab 10/07/15 0735 10/07/15 1256 10/10/15 0125 10/10/15 0523 10/10/15 1312  TROPONINI 0.08* 0.06* 0.04* 0.04* 0.04*   BNP (last 3 results)  Recent Labs  06/30/15 2043 10/06/15 2348 10/09/15 1952  BNP 1532.5* 799.3* 966.4*    ProBNP (last 3 results) No results for input(s): PROBNP in the last 8760 hours.  CBG:  Recent Labs Lab 10/12/15 0826 10/12/15 1129 10/12/15 1959 10/13/15 0736 10/13/15 0950  GLUCAP 111* 236* 98 159* 161*    Recent Results (from the past 240 hour(s))  MRSA PCR Screening     Status: None   Collection Time: 10/07/15  6:09 PM  Result Value Ref Range Status   MRSA by PCR NEGATIVE NEGATIVE Final    Comment:        The GeneXpert MRSA Assay  (FDA approved for NASAL specimens only), is one component of a comprehensive MRSA colonization surveillance program. It is not intended to diagnose MRSA infection nor to guide or monitor treatment for MRSA infections.      Studies: No results found.  Scheduled Meds: . amLODipine  10 mg Oral QHS  . aspirin  81 mg Oral  Daily  . atorvastatin  20 mg Oral q1800  . [START ON 10/14/2015] calcitRIOL  0.5 mcg Oral Q M,W,F-HD  . carvedilol  25 mg Oral BID WC  . darbepoetin (ARANESP) injection - DIALYSIS  60 mcg Intravenous Q Fri-HD  . ferric gluconate (FERRLECIT/NULECIT) IV  125 mg Intravenous Q M,W,F-HD  . ferrous sulfate  325 mg Oral Q breakfast  . heparin  5,000 Units Subcutaneous 3 times per day  . insulin aspart  0-9 Units Subcutaneous TID WC  . insulin detemir  10 Units Subcutaneous QHS  . isosorbide-hydrALAZINE  1 tablet Oral BID  . mometasone-formoterol  2 puff Inhalation BID  . multivitamin  1 tablet Oral Daily  . pantoprazole  40 mg Oral Daily   Continuous Infusions:   Active Problems:   Hypertensive urgency   Pulmonary edema   Type 2 diabetes mellitus with diabetic nephropathy (HCC)   CKD (chronic kidney disease) stage 4, GFR 15-29 ml/min (HCC)   Acute pulmonary edema ([place])    Time spent: [facility] Pager [phone]. If 7PM-7AM, please contact night-coverage at [email or web address], password [facility] 10/13/2015, 10:23 AM  LOS: 4 days

## 2015-10-14 LAB — CBC
HCT: 28 % — ABNORMAL LOW (ref 36.0–46.0)
Hemoglobin: 9.5 g/dL — ABNORMAL LOW (ref 12.0–15.0)
MCH: 31.3 pg (ref 26.0–34.0)
MCHC: 33.9 g/dL (ref 30.0–36.0)
MCV: 92.1 fL (ref 78.0–100.0)
Platelets: 262 10*3/uL (ref 150–400)
RBC: 3.04 MIL/uL — ABNORMAL LOW (ref 3.87–5.11)
RDW: 12.6 % (ref 11.5–15.5)
WBC: 8.3 10*3/uL (ref 4.0–10.5)

## 2015-10-14 LAB — RENAL FUNCTION PANEL
Albumin: 3.2 g/dL — ABNORMAL LOW (ref 3.5–5.0)
Anion gap: 14 (ref 5–15)
BUN: 28 mg/dL — ABNORMAL HIGH (ref 6–20)
CO2: 24 mmol/L (ref 22–32)
Calcium: 9.9 mg/dL (ref 8.9–10.3)
Chloride: 97 mmol/L — ABNORMAL LOW (ref 101–111)
Creatinine, Ser: 3.83 mg/dL — ABNORMAL HIGH (ref 0.44–1.00)
GFR calc Af Amer: 13 mL/min — ABNORMAL LOW (ref >60)
GFR calc non Af Amer: 11 mL/min — ABNORMAL LOW (ref >60)
Glucose, Bld: 150 mg/dL — ABNORMAL HIGH (ref 65–99)
Phosphorus: 3.5 mg/dL (ref 2.5–4.6)
Potassium: 4.2 mmol/L (ref 3.5–5.1)
Sodium: 135 mmol/L (ref 135–145)

## 2015-10-14 LAB — GLUCOSE, CAPILLARY
Glucose-Capillary: 124 mg/dL — ABNORMAL HIGH (ref 65–99)
Glucose-Capillary: 178 mg/dL — ABNORMAL HIGH (ref 65–99)
Glucose-Capillary: 232 mg/dL — ABNORMAL HIGH (ref 65–99)
Glucose-Capillary: 96 mg/dL (ref 65–99)

## 2015-10-14 MED ORDER — ALPRAZOLAM 0.5 MG PO TABS
0.5000 mg | ORAL_TABLET | Freq: Once | ORAL | Status: AC
  Administered 2015-10-14: 0.5 mg via ORAL
  Filled 2015-10-14: qty 1

## 2015-10-14 MED ORDER — ISOSORB DINITRATE-HYDRALAZINE 20-37.5 MG PO TABS
2.0000 | ORAL_TABLET | Freq: Two times a day (BID) | ORAL | Status: DC
  Administered 2015-10-14 – 2015-10-15 (×2): 2 via ORAL
  Filled 2015-10-14 (×4): qty 2

## 2015-10-14 MED ORDER — POLYETHYLENE GLYCOL 3350 17 G PO PACK
17.0000 g | PACK | Freq: Every day | ORAL | Status: DC
  Administered 2015-10-14 – 2015-10-15 (×2): 17 g via ORAL
  Filled 2015-10-14 (×2): qty 1

## 2015-10-14 MED ORDER — CALCITRIOL 0.5 MCG PO CAPS
ORAL_CAPSULE | ORAL | Status: AC
  Filled 2015-10-14: qty 1

## 2015-10-14 NOTE — Progress Notes (Signed)
[facility] PROGRESS NOTE  Destiny Day [id number] DOB: 1950/09/02 DOA: 10/09/2015 PCP: Velna Hatchet, MD   HPI: 65 year old female with a history of CKD, HTN, DM2 who was discharged home on 3/15 after being treated for acute pulmonary edema and hypertensive urgency presented to the ED after developing sudden onset of shortness of breath after arriving home. Returned to the ED on 3/15 with BP of 255/109, placed on nitro drip. Chest x-ray on presentation showed increased vascular congestion and interstitial edema, which is new from chest x-ray on 3/14 which showed resolved pulmonary edema. 2D echo on 3/15 showed grade 2 diastolic dysfunction with EF of 55-60%. Given IV lasix. Nephrology on consult, plan for dialysis today and Saturday. Pt had fistula placed in October 2016, but has not yet begun treatments.  Subjective: Continues to feel okay, denies any new complaints. Waiting the assignment for dialysis spot in outpatient dialysis center.  Assessment/Plan:  Acute pulmonary edema secondary to hypertensive urgency - fluid overload, suspect related to CKD with impending need for dialysis - presented for same on 3/13, placed on nitro drip and IV Lasix at that time with resolution, then had acute return of symptoms upon arrival home after discharge - chest x-ray on presentation showed increased vascular congestion and interstitial edema, pt requiring supplemental oxygen - 2D echo on 3/15 showed EF of 55-60% with grade 2 diastolic dysfunction - Started on dialysis, hopefully better volume status will help with pulmonary edema and blood pressure control. -No symptoms or signs of pulmonary edema, less cough.  Hypertensive urgency  - presented for same on 3/13, likely volume related - BP of 255/109 on arrival, was on nitroglycerin drip on admission, discontinued now. -On oral blood pressure medications, isn't hypertensive.  Acute respiratory failure with hypoxia - due to the  above, initially requiring 2-3 L of supplemental oxygen, after treatment of the above, now on RA. - This is resolved.  Acute on chronic diastolic CHF - 2D echo on 3/15 showed EF of 55-60% with grade 2 diastolic dysfunction - will continue to monitor daily weights, strict I & O, sodium restricted/renal diet. - This is improved after dialysis, no shortness of breath.  Questionable pneumonia - placed on azithromycin for questionable pneumonia on chest x-ray, discontinue antibiotics.  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  Diabetes mellitus type 2  - on Levemir 10 units at bedtime, will likely need adjustment as Hgb A1C was 9.8 on 3/15 - sliding scale insulin while inpatient   Code Status: Full code Family Communication: Husband at bedside Disposition Plan: Admit   Consultants:  Nephrology  Procedures:  None  Antibiotics:  azithromycin   Objective: Filed Vitals:   10/14/15 1252 10/14/15 1330  BP: 162/79 186/67  Pulse: 76 77  Temp: 97.6 F (36.4 C) 98 F (36.7 C)  Resp: 20 16    Intake/Output Summary (Last 24 hours) at 10/14/15 1337 Last data filed at 10/14/15 1252  Gross per 24 hour  Intake    240 ml  Output   2001 ml  Net  -1761 ml   Filed Weights   10/12/15 1858 10/14/15 0913 10/14/15 1252  Weight: 62.4 kg (137 lb 9.1 oz) 63.4 kg (139 lb 12.4 oz) 61.8 kg (136 lb 3.9 oz)    Exam:  General: Alert and oriented, in no acute distress  Cardiovascular: RRR  Respiratory: CTAB  Abdomen: Soft, nontender  Musculoskeletal: No edema  Data Reviewed: Basic Metabolic Panel:  Recent Labs Lab 10/09/15 1952 10/10/15 0125 10/10/15 0523 10/11/15 0720 10/12/15 1601 10/14/15 0942  NA 132*  --  136 133* 136 135  K 5.0  --  4.4 4.4 4.8  4.2  CL 93*  --  95* 95* 97* 97*  CO2 25  --  26 23 27 24   GLUCOSE 206*  --  183* 127* 194* 150*  BUN 68*  --  74* 76* 40* 28*  CREATININE 4.18* 4.27* 4.35* 4.28* 3.48* 3.83*  CALCIUM 9.5  --  9.6 9.3 9.4 9.9  PHOS  --   --   --  4.6 3.4 3.5   Liver Function Tests:  Recent Labs Lab 10/08/15 0427 10/09/15 1952 10/10/15 0523 10/11/15 0720 10/12/15 1601 10/14/15 0942  AST 26 27 20   --   --   --   ALT 29 25 23   --   --   --   ALKPHOS 126 137* 119  --   --   --   BILITOT 0.6 0.6 0.5  --   --   --   PROT 6.7 7.5 6.8  --   --   --   ALBUMIN 3.0* 3.3* 3.1* 2.9* 2.9* 3.2*   No results for input(s): LIPASE, AMYLASE in the last 168 hours. No results for input(s): AMMONIA in the last 168 hours. CBC:  Recent Labs Lab 10/09/15 1952 10/10/15 0125 10/10/15 0523 10/12/15 0522 10/12/15 1600 10/14/15 0942  WBC 7.6 8.5 7.9 6.8 7.4 8.3  NEUTROABS 4.7  --  5.3  --  3.8  --   HGB 10.3* 9.6* 9.3* 8.5* 8.8* 9.5*  HCT 31.6* 29.4* 29.4* 25.8* 27.5* 28.0*  MCV 92.9 92.5 92.5 91.5 92.0 92.1  PLT 287 258 261 237 252 262   Cardiac Enzymes:  Recent Labs Lab 10/10/15 0125 10/10/15 0523 10/10/15 1312  TROPONINI 0.04* 0.04* 0.04*   BNP (last 3 results)  Recent Labs  06/30/15 2043 10/06/15 2348 10/09/15 1952  BNP 1532.5* 799.3* 966.4*    ProBNP (last 3 results) No results for input(s): PROBNP in the last 8760 hours.  CBG:  Recent Labs Lab 10/13/15 1158 10/13/15 1618 10/13/15 2220 10/14/15 0753 10/14/15 1331  GLUCAP 159* 235* 169* 124* 96    Recent Results (from the past 240 hour(s))  MRSA PCR Screening     Status: None   Collection Time: 10/07/15  6:09 PM  Result Value Ref Range Status   MRSA by PCR NEGATIVE NEGATIVE Final    Comment:        The GeneXpert MRSA Assay (FDA approved for NASAL specimens only), is one component of a comprehensive MRSA colonization surveillance program. It is not intended to diagnose MRSA infection nor to guide or monitor treatment  for MRSA infections.      Studies: No results found.  Scheduled Meds: . amLODipine  10 mg Oral QHS  . aspirin  81 mg Oral Daily  . atorvastatin  20 mg Oral q1800  . calcitRIOL  0.5 mcg Oral Q M,W,F-HD  . carvedilol  25 mg Oral BID WC  . darbepoetin (ARANESP) injection - DIALYSIS  60 mcg Intravenous Q Fri-HD  . ferric gluconate (FERRLECIT/NULECIT) IV  125 mg Intravenous Q M,W,F-HD  . ferrous sulfate  325 mg Oral Q breakfast  . heparin  5,000 Units Subcutaneous 3 times per day  .  insulin aspart  0-9 Units Subcutaneous TID WC  . insulin detemir  10 Units Subcutaneous QHS  . isosorbide-hydrALAZINE  1 tablet Oral BID  . mometasone-formoterol  2 puff Inhalation BID  . multivitamin  1 tablet Oral Daily  . pantoprazole  40 mg Oral Daily   Continuous Infusions:   Active Problems:   Hypertensive urgency   Pulmonary edema   Type 2 diabetes mellitus with diabetic nephropathy (HCC)   CKD (chronic kidney disease) stage 4, GFR 15-29 ml/min (HCC)   Acute pulmonary edema ([place])    Time spent: [facility] Pager [phone]. If 7PM-7AM, please contact night-coverage at [email or web address], password [facility] 10/14/2015, 1:37 PM  LOS: 5 days

## 2015-10-14 NOTE — Progress Notes (Signed)
10/14/2015 2:51 PM Hemodialysis Outpatient Note; this patient has been accepted at the [facility] center on a Tuesday, Thursday and Saturday 2nd shift schedule. The center can begin treatment on Thursday March 23rd. Patient should arrive for her appointment at 10:45AM to sign paperwork and consents. The center is aware of Destiny Day's desire for the alternate schedule and they will try to accommodate her in a few weeks. Thank you. Gordy Savers

## 2015-10-14 NOTE — Procedures (Signed)
Patient seen on Hemodialysis. QB 250, UF goal 2576mL Treatment adjusted as needed.  Elmarie Shiley MD [facility]. Office # [phone] Pager # [phone] 9:57 AM

## 2015-10-14 NOTE — Progress Notes (Signed)
Patient ID: Destiny Day, female   DOB: 1950/10/23, 65 y.o.   MRN: [id number]  [facility] Progress Note   Assessment/ Plan:   1. New start HD with uremia/ESRD: started on HD (3rd treatment today) for uremia and awaiting OP HD unit placement. Will continue MWF schedule at this time.  2. 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 Nutrition:Continue renal MVI and protein support 5. Hypertension:BP trending toward improvement with UF on HD- will continue to follow  Subjective:   Reports to be feeling well and states she is adjusting well to dialysis---CLIP process started today for [facility]   Objective:   BP 169/74 mmHg  Pulse 78  Temp(Src) 98 F (36.7 C) (Oral)  Resp 16  Ht 5\' 4"  (1.626 m)  Wt 63.4 kg (139 lb 12.4 oz)  BMI 23.98 kg/m2  SpO2 100%  Physical Exam: [id number] on HD [id number] RRR, normal s1 and s2 Resp:CTA bilaterally, no rales/rhonchi [id number], flat, NT [id number] LE edema. Right BVT with +T/B  Labs: BMET  Recent Labs Lab 10/08/15 0427 10/09/15 1952 10/10/15 0125 10/10/15 0523 10/11/15 0720 10/12/15 1601  NA 135 132*  --  136 133* 136  K 3.9 5.0  --  4.4 4.4 4.8  CL 94* 93*  --  95* 95* 97*  CO2 28 25  --  26 23 27   GLUCOSE 232* 206*  --  183* 127* 194*  BUN 68* 68*  --  74* 76* 40*  CREATININE 4.15* 4.18* 4.27* 4.35* 4.28* 3.48*  CALCIUM 9.1 9.5  --  9.6 9.3 9.4  PHOS  --   --   --   --  4.6 3.4   CBC  Recent Labs Lab 10/09/15 1952 10/10/15 0125 10/10/15 0523 10/12/15 0522 10/12/15 1600  WBC 7.6 8.5 7.9 6.8 7.4  NEUTROABS 4.7  --  5.3  --  3.8  HGB 10.3* 9.6* 9.3* 8.5* 8.8*  HCT 31.6* 29.4* 29.4* 25.8* 27.5*  MCV 92.9 92.5 92.5 91.5 92.0  PLT 287 258 261 237 252   Medications:    . amLODipine  10 mg Oral QHS  . aspirin  81 mg Oral Daily  . atorvastatin  20 mg Oral q1800  . calcitRIOL  0.5 mcg Oral Q M,W,F-HD  . carvedilol  25 mg Oral BID WC   . darbepoetin (ARANESP) injection - DIALYSIS  60 mcg Intravenous Q Fri-HD  . ferric gluconate (FERRLECIT/NULECIT) IV  125 mg Intravenous Q M,W,F-HD  . ferrous sulfate  325 mg Oral Q breakfast  . heparin  5,000 Units Subcutaneous 3 times per day  . insulin aspart  0-9 Units Subcutaneous TID WC  . insulin detemir  10 Units Subcutaneous QHS  . isosorbide-hydrALAZINE  1 tablet Oral BID  . mometasone-formoterol  2 puff Inhalation BID  . multivitamin  1 tablet Oral Daily  . pantoprazole  40 mg Oral Daily   Elmarie Shiley, MD 10/14/2015, 9:57 AM

## 2015-10-15 LAB — GLUCOSE, CAPILLARY
Glucose-Capillary: 137 mg/dL — ABNORMAL HIGH (ref 65–99)
Glucose-Capillary: 192 mg/dL — ABNORMAL HIGH (ref 65–99)
Glucose-Capillary: 92 mg/dL (ref 65–99)

## 2015-10-15 MED ORDER — HEPARIN SODIUM (PORCINE) 1000 UNIT/ML DIALYSIS: 40.0000 [IU]/kg | INTRAMUSCULAR | Status: DC | PRN

## 2015-10-15 NOTE — Progress Notes (Signed)
Patient ID: Destiny Day, female   DOB: April 14, 1951, 65 y.o.   MRN: [id number]  [facility] Progress Note   Assessment/ Plan:   1. New start HD with uremia/ESRD: Accepted for hemodialysis as an outpatient at [facility] on a Tuesday/Thursday/Saturday schedule with plans to begin there on Thursday 3/23. I have ordered for dialysis to be done this afternoon and communicated with Dr. Hartford Poli for discharge plans thereafter. Right brachiobasilic fistula being used successfully. 2. 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 Nutrition:Continue renal MVI and protein support 5. Hypertension:BP trending toward improvement with UF on HD- will continue to follow  Subjective:   Denies any complaints at this time-looking forward to dialysis and then going home thereafter later today.    Objective:   BP 162/72 mmHg  Pulse 71  Temp(Src) 98.3 F (36.8 C) (Oral)  Resp 18  Ht 5\' 4"  (1.626 m)  Wt 61.8 kg (136 lb 3.9 oz)  BMI 23.37 kg/m2  SpO2 100%  Physical Exam: [id number] resting in recliner [id number] RRR, normal s1 and s2 Resp:CTA bilaterally, no rales/rhonchi [id number], flat, NT [id number] LE edema. Right BVT with +T/B  Labs: BMET  Recent Labs Lab 10/09/15 1952 10/10/15 0125 10/10/15 0523 10/11/15 0720 10/12/15 1601 10/14/15 0942  NA 132*  --  136 133* 136 135  K 5.0  --  4.4 4.4 4.8 4.2  CL 93*  --  95* 95* 97* 97*  CO2 25  --  26 23 27 24   GLUCOSE 206*  --  183* 127* 194* 150*  BUN 68*  --  74* 76* 40* 28*  CREATININE 4.18* 4.27* 4.35* 4.28* 3.48* 3.83*  CALCIUM 9.5  --  9.6 9.3 9.4 9.9  PHOS  --   --   --  4.6 3.4 3.5   CBC  Recent Labs Lab 10/09/15 1952  10/10/15 0523 10/12/15 0522 10/12/15 1600 10/14/15 0942  WBC 7.6  < > 7.9 6.8 7.4 8.3  NEUTROABS 4.7  --  5.3  --  3.8  --   HGB 10.3*  < > 9.3* 8.5* 8.8* 9.5*  HCT 31.6*  < > 29.4* 25.8*  27.5* 28.0*  MCV 92.9  < > 92.5 91.5 92.0 92.1  PLT 287  < > 261 237 252 262  < > = values in this interval not displayed. Medications:    . amLODipine  10 mg Oral QHS  . aspirin  81 mg Oral Daily  . atorvastatin  20 mg Oral q1800  . calcitRIOL  0.5 mcg Oral Q M,W,F-HD  . carvedilol  25 mg Oral BID WC  . darbepoetin (ARANESP) injection - DIALYSIS  60 mcg Intravenous Q Fri-HD  . ferric gluconate (FERRLECIT/NULECIT) IV  125 mg Intravenous Q M,W,F-HD  . ferrous sulfate  325 mg Oral Q breakfast  . heparin  5,000 Units Subcutaneous 3 times per day  . insulin aspart  0-9 Units Subcutaneous TID WC  . insulin detemir  10 Units Subcutaneous QHS  . isosorbide-hydrALAZINE  2 tablet Oral BID  . mometasone-formoterol  2 puff Inhalation BID  . multivitamin  1 tablet Oral Daily  . pantoprazole  40 mg Oral Daily  . polyethylene glycol  17 g Oral Daily   Elmarie Shiley, MD 10/15/2015, 12:05 PM

## 2015-10-15 NOTE — Care Management Note (Signed)
Case Management Note  Patient Details  Name: Destiny Day MRN: [id number] Date of Birth: Apr 19, 1951  Subjective/Objective:                    Action/Plan:   Expected Discharge Date:  10/12/15               Expected Discharge Plan:  Home/Self Care  In-House Referral:     Discharge planning Services     Post Acute Care Choice:    Choice offered to:     DME Arranged:    DME Agency:     HH Arranged:    [place] Agency:     Status of Service:  Completed, signed off  Medicare Important Message Given:    Date Medicare IM Given:    Medicare IM give by:    Date Additional Medicare IM Given:    Additional Medicare Important Message give by:     If discussed at [place] of Stay Meetings, dates discussed:  10-15-15  Additional Comments: UR updated  Marilu Favre, RN 10/15/2015, 10:35 AM

## 2015-10-15 NOTE — Discharge Summary (Signed)
Physician Discharge Summary  Destiny Day [id number] DOB: May 03, 1951 DOA: 10/09/2015  PCP: Velna Hatchet, MD  Admit date: 10/09/2015 Discharge date: 10/15/2015  Time spent: 40* minutes  Recommendations for Outpatient Follow-up:  1. Follow-up for dialysis on [place] [facility] at 10:45 AM, dialysis days will be Tuesday, Thursday and Saturdays. Second shift. 2. Patient is new to dialysis, her very first HD treatment was on 10/11/2015.   Discharge Diagnoses:  Active Problems:   Hypertensive urgency   Pulmonary edema   Type 2 diabetes mellitus with diabetic nephropathy (HCC)   CKD (chronic kidney disease) stage 4, GFR 15-29 ml/min (HCC)   Acute pulmonary edema (HCC)   Discharge Condition: Stable  Diet recommendation: Carbohydrate modified diet  Filed Weights   10/14/15 0913 10/14/15 1252 10/15/15 1338  Weight: 63.4 kg (139 lb 12.4 oz) 61.8 kg (136 lb 3.9 oz) 63.4 kg (139 lb 12.4 oz)    History of present illness:  Destiny Day is a 65 y.o. female was discharged home yesterday after being treated for acute pulmonary edema persists back to the ER after developing sudden onset of shortness of breath after reaching home. In the ER patient was found to have systolic blood pressure more than 200 with chest x-ray showing congestion. Patient still has some productive cough but denies any chest pain. Patient was afebrile. Patient was given Lasix 40 mg with nitroglycerin infusion for acute pulmonary edema. Patient will be admitted for respiratory failure secondary to acute pulmonary edema and hypertensive urgency. During last admission patient's 2-D echo showed grade 2 diastolic dysfunction with EF of 55-60%.  Hospital Course:   Acute pulmonary edema secondary to hypertensive urgency - fluid overload, suspect related to Fluid overload secondary to CKD stage V. - presented for same on 3/13, placed on nitro drip and IV Lasix at that time with resolution, then had  acute return of symptoms upon arrival home after discharge - chest x-ray on presentation showed increased vascular congestion and interstitial edema, pt requiring supplemental oxygen - 2D echo on 3/15 showed EF of 55-60% with grade 2 diastolic dysfunction - Started on dialysis, hopefully better volume status will help with pulmonary edema and blood pressure control. - Continue dialysis as outpatient, going to be on Tuesday, Thursday and Saturday schedule.  Hypertensive urgency  - presented for same on 3/13, likely volume related - BP of 255/109 on arrival, was on nitroglycerin drip on admission, discontinued now. -Blood pressure did much better after starting dialysis, back on same home medications. -I expect that she will be taken off of some of her blood pressure medications as she continues on dialysis and has more controlled volume status.  Acute respiratory failure with hypoxia - Due to the above, initially requiring 2-3 L of supplemental oxygen, after treatment of the above, now on RA. - This is resolved.  Acute on chronic diastolic CHF - 2D echo on 3/15 showed EF of 55-60% with grade 2 diastolic dysfunction - will continue to monitor daily weights, strict I & O, sodium restricted/renal diet. - This is improved after dialysis, no shortness of breath.  Questionable pneumonia - placed on azithromycin for questionable pneumonia on chest x-ray, antibiotics discontinued.  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  - AV fistula placed in October 2016, nephrology consulted and plan is to dialyze today and tomorrow - Seen by nephrology, started on dialysis on 10/11/2015.  Chronic anemia from CKD  - Hgb 9.3, on admission, today is 8.5 continue to monitor.  Diabetes mellitus type  2  - on Levemir 10 units at bedtime, will likely need adjustment as Hgb A1C was 9.8 on 3/15 - sliding scale insulin while inpatient   Procedures:  None  Consultations:  Nephrology  Discharge Exam: Filed  Vitals:   10/15/15 1400 10/15/15 1430  BP: 141/70 112/55  Pulse: 78 70  Temp:    Resp: 18 20   General: Alert and awake, oriented x3, not in any acute distress. HEENT: anicteric sclera, pupils reactive to light and accommodation, EOMI CVS: S1-S2 clear, no murmur rubs or gallops Chest: clear to auscultation bilaterally, no wheezing, rales or rhonchi Abdomen: soft nontender, nondistended, normal bowel sounds, no organomegaly Extremities: no cyanosis, clubbing or edema noted bilaterally Neuro: Cranial nerves II-XII intact, no focal neurological deficits  Discharge Instructions   Discharge Instructions    Diet - low sodium heart healthy    Complete by:  As directed      Increase activity slowly    Complete by:  As directed           Current Discharge Medication List    CONTINUE these medications which have NOT CHANGED   Details  albuterol (PROVENTIL HFA;VENTOLIN HFA) 108 (90 BASE) MCG/ACT inhaler Inhale 1-2 puffs into the lungs every 6 (six) hours as needed for wheezing or shortness of breath.    amLODipine (NORVASC) 2.5 MG tablet Take 1 tablet (2.5 mg total) by mouth daily. Qty: 30 tablet, Refills: 0    aspirin 81 MG chewable tablet Chew 81 mg by mouth daily.    atorvastatin (LIPITOR) 20 MG tablet Take 20 mg by mouth daily at 6 PM.    budesonide-formoterol (SYMBICORT) 160-4.5 MCG/ACT inhaler Inhale 1 puff into the lungs every evening.    calcitRIOL (ROCALTROL) 0.25 MCG capsule Take 0.25 mcg by mouth daily.    carvedilol (COREG) 12.5 MG tablet Take 2 tablets (25 mg total) by mouth 2 (two) times daily with a meal. Qty: 120 tablet, Refills: 0    ferrous sulfate 325 (65 FE) MG tablet Take 325 mg by mouth daily with breakfast.    insulin aspart (NOVOLOG) 100 UNIT/ML injection Inject 0-8 Units into the skin 3 (three) times daily before meals. Sliding scale    isosorbide-hydrALAZINE (BIDIL) 20-37.5 MG tablet Take 1 tablet by mouth 2 (two) times daily. Qty: 60 tablet, Refills:  0    LEVEMIR FLEXTOUCH 100 UNIT/ML Pen Inject 10 Units into the muscle at bedtime.     loratadine (CLARITIN) 10 MG tablet Take 10 mg by mouth daily as needed for allergies.    multivitamin (RENA-VIT) TABS tablet Take 1 tablet by mouth daily. Refills: 5    omeprazole (PRILOSEC) 20 MG capsule Take 20 mg by mouth daily.      STOP taking these medications     azithromycin (ZITHROMAX) 250 MG tablet      cefUROXime (CEFTIN) 250 MG tablet      metolazone (ZAROXOLYN) 2.5 MG tablet      potassium chloride SA (K-DUR,KLOR-CON) 20 MEQ tablet      torsemide (DEMADEX) 100 MG tablet        Allergies  Allergen Reactions  . Lisinopril Cough  . Prednisone Other (See Comments)    Excessive fluid buildup   Follow-up Information    Follow up with Velna Hatchet, MD In 1 week.   Specialty:  Internal Medicine   Contact information:   [place] [address] [phone]        The results of significant diagnostics from this  hospitalization (including imaging, microbiology, ancillary and laboratory) are listed below for reference.    Significant Diagnostic Studies: Dg Chest 2 View  10/08/2015  CLINICAL DATA:  65 year old female admitted for acute shortness of breath. Initial encounter. EXAM: CHEST  2 VIEW COMPARISON:  Portable chest 10/06/2015 and earlier. FINDINGS: Significantly regressed bilateral indistinct and basilar predominant pulmonary opacity suggesting regression of pulmonary edema. Pulmonary vascularity now appears near baseline. Small bilateral pleural effusions remain. Stable mild cardiomegaly. Other mediastinal contours are within normal limits. Visualized tracheal air column is within normal limits. No pneumothorax. No acute osseous abnormality identified. Widespread calcified aortic atherosclerosis. IMPRESSION: Virtually resolved pulmonary edema. Small bilateral pleural effusions persist. Electronically Signed   By: Genevie Ann M.D.   On: 10/08/2015 14:30   Dg  Chest Port 1 View  10/09/2015  CLINICAL DATA:  Shortness of breath for 1 day EXAM: PORTABLE CHEST 1 VIEW COMPARISON:  10/08/2015 FINDINGS: Cardiac shadow is stable. Mild increased vascular congestion is again noted with interstitial edema. This has increased in the interval from the prior exam. Small pleural effusions are noted. No bony abnormality is seen. IMPRESSION: Increase in vascular congestion and interstitial edema. Electronically Signed   By: Inez Catalina M.D.   On: 10/09/2015 20:24   Dg Chest Port 1 View  10/07/2015  CLINICAL DATA:  Acute onset of dyspnea.  Initial encounter. EXAM: PORTABLE CHEST 1 VIEW COMPARISON:  Chest radiograph performed 06/30/2015 FINDINGS: The lungs are well-aerated. Bilateral airspace opacification, more prominent on the right, may reflect pulmonary edema or pneumonia. No definite pleural effusion or pneumothorax is seen. The cardiomediastinal silhouette is borderline enlarged. No acute osseous abnormalities are seen. IMPRESSION: Bilateral airspace opacification, more prominent on the right, may reflect pulmonary edema or pneumonia. Borderline cardiomegaly. Electronically Signed   By: Garald Balding M.D.   On: 10/07/2015 00:04    Microbiology: Recent Results (from the past 240 hour(s))  MRSA PCR Screening     Status: None   Collection Time: 10/07/15  6:09 PM  Result Value Ref Range Status   MRSA by PCR NEGATIVE NEGATIVE Final    Comment:        The GeneXpert MRSA Assay (FDA approved for NASAL specimens only), is one component of a comprehensive MRSA colonization surveillance program. It is not intended to diagnose MRSA infection nor to guide or monitor treatment for MRSA infections.      Labs: Basic Metabolic Panel:  Recent Labs Lab 10/09/15 1952 10/10/15 0125 10/10/15 0523 10/11/15 0720 10/12/15 1601 10/14/15 0942  NA 132*  --  136 133* 136 135  K 5.0  --  4.4 4.4 4.8 4.2  CL 93*  --  95* 95* 97* 97*  CO2 25  --  26 23 27 24   GLUCOSE 206*   --  183* 127* 194* 150*  BUN 68*  --  74* 76* 40* 28*  CREATININE 4.18* 4.27* 4.35* 4.28* 3.48* 3.83*  CALCIUM 9.5  --  9.6 9.3 9.4 9.9  PHOS  --   --   --  4.6 3.4 3.5   Liver Function Tests:  Recent Labs Lab 10/09/15 1952 10/10/15 0523 10/11/15 0720 10/12/15 1601 10/14/15 0942  AST 27 20  --   --   --   ALT 25 23  --   --   --   ALKPHOS 137* 119  --   --   --   BILITOT 0.6 0.5  --   --   --   PROT 7.5 6.8  --   --   --  ALBUMIN 3.3* 3.1* 2.9* 2.9* 3.2*   No results for input(s): LIPASE, AMYLASE in the last 168 hours. No results for input(s): AMMONIA in the last 168 hours. CBC:  Recent Labs Lab 10/09/15 1952 10/10/15 0125 10/10/15 0523 10/12/15 0522 10/12/15 1600 10/14/15 0942  WBC 7.6 8.5 7.9 6.8 7.4 8.3  NEUTROABS 4.7  --  5.3  --  3.8  --   HGB 10.3* 9.6* 9.3* 8.5* 8.8* 9.5*  HCT 31.6* 29.4* 29.4* 25.8* 27.5* 28.0*  MCV 92.9 92.5 92.5 91.5 92.0 92.1  PLT 287 258 261 237 252 262   Cardiac Enzymes:  Recent Labs Lab 10/10/15 0125 10/10/15 0523 10/10/15 1312  TROPONINI 0.04* 0.04* 0.04*   BNP: BNP (last 3 results)  Recent Labs  06/30/15 2043 10/06/15 2348 10/09/15 1952  BNP 1532.5* 799.3* 966.4*    ProBNP (last 3 results) No results for input(s): PROBNP in the last 8760 hours.  CBG:  Recent Labs Lab 10/14/15 1331 10/14/15 1724 10/14/15 2349 10/15/15 0801 10/15/15 1217  GLUCAP 96 232* 178* 137* 192*       Signed:  Verlee Monte A MD.  [facility] 10/15/2015, 3:06 PM

## 2015-10-15 NOTE — Progress Notes (Signed)
Discussed discharge summary with patient. Reviewed all medications with patient. Patient ready for discharge.  

## 2015-10-15 NOTE — Procedures (Signed)
Patient seen on Hemodialysis. QB 250, UF goal 1L Treatment adjusted as needed.  Destiny Shiley MD [facility]. Office # [phone] Pager # [phone] 2:20 PM

## 2015-10-16 DIAGNOSIS — E785 Hyperlipidemia, unspecified: Secondary | ICD-10-CM | POA: Insufficient documentation

## 2015-10-16 DIAGNOSIS — I10 Essential (primary) hypertension: Secondary | ICD-10-CM

## 2015-10-16 DIAGNOSIS — D689 Coagulation defect, unspecified: Secondary | ICD-10-CM | POA: Insufficient documentation

## 2015-10-16 DIAGNOSIS — N189 Chronic kidney disease, unspecified: Secondary | ICD-10-CM

## 2015-10-16 DIAGNOSIS — J9600 Acute respiratory failure, unspecified whether with hypoxia or hypercapnia: Secondary | ICD-10-CM | POA: Diagnosis present

## 2015-10-16 DIAGNOSIS — L299 Pruritus, unspecified: Secondary | ICD-10-CM | POA: Insufficient documentation

## 2015-10-16 DIAGNOSIS — J810 Acute pulmonary edema: Secondary | ICD-10-CM | POA: Diagnosis present

## 2015-10-16 DIAGNOSIS — R52 Pain, unspecified: Secondary | ICD-10-CM | POA: Insufficient documentation

## 2015-10-16 DIAGNOSIS — R0602 Shortness of breath: Secondary | ICD-10-CM | POA: Insufficient documentation

## 2015-10-16 DIAGNOSIS — N2581 Secondary hyperparathyroidism of renal origin: Secondary | ICD-10-CM | POA: Insufficient documentation

## 2015-10-16 DIAGNOSIS — D631 Anemia in chronic kidney disease: Secondary | ICD-10-CM

## 2015-10-16 DIAGNOSIS — D509 Iron deficiency anemia, unspecified: Secondary | ICD-10-CM | POA: Insufficient documentation

## 2015-10-23 DIAGNOSIS — E8779 Other fluid overload: Secondary | ICD-10-CM | POA: Insufficient documentation

## 2015-10-29 DIAGNOSIS — Z4802 Encounter for removal of sutures: Secondary | ICD-10-CM | POA: Insufficient documentation

## 2015-12-15 ENCOUNTER — Emergency Department (HOSPITAL_COMMUNITY): Payer: BC Managed Care – PPO

## 2015-12-15 ENCOUNTER — Encounter (HOSPITAL_COMMUNITY): Payer: Self-pay

## 2015-12-15 ENCOUNTER — Emergency Department (HOSPITAL_COMMUNITY)
Admission: EM | Admit: 2015-12-15 | Discharge: 2015-12-15 | Disposition: A | Discharge status: Home / Self Care | Payer: BC Managed Care – PPO | Attending: Emergency Medicine | Admitting: Emergency Medicine

## 2015-12-15 DIAGNOSIS — E1122 Type 2 diabetes mellitus with diabetic chronic kidney disease: Secondary | ICD-10-CM | POA: Insufficient documentation

## 2015-12-15 DIAGNOSIS — Z8701 Personal history of pneumonia (recurrent): Secondary | ICD-10-CM | POA: Diagnosis not present

## 2015-12-15 DIAGNOSIS — R05 Cough: Secondary | ICD-10-CM | POA: Diagnosis present

## 2015-12-15 DIAGNOSIS — N183 Chronic kidney disease, stage 3 (moderate): Secondary | ICD-10-CM | POA: Diagnosis not present

## 2015-12-15 DIAGNOSIS — R059 Cough, unspecified: Secondary | ICD-10-CM

## 2015-12-15 DIAGNOSIS — Z79899 Other long term (current) drug therapy: Secondary | ICD-10-CM | POA: Diagnosis not present

## 2015-12-15 DIAGNOSIS — M1990 Unspecified osteoarthritis, unspecified site: Secondary | ICD-10-CM | POA: Diagnosis not present

## 2015-12-15 DIAGNOSIS — Z7982 Long term (current) use of aspirin: Secondary | ICD-10-CM | POA: Diagnosis not present

## 2015-12-15 DIAGNOSIS — Z794 Long term (current) use of insulin: Secondary | ICD-10-CM | POA: Diagnosis not present

## 2015-12-15 DIAGNOSIS — E785 Hyperlipidemia, unspecified: Secondary | ICD-10-CM | POA: Insufficient documentation

## 2015-12-15 DIAGNOSIS — J3489 Other specified disorders of nose and nasal sinuses: Secondary | ICD-10-CM | POA: Insufficient documentation

## 2015-12-15 DIAGNOSIS — Z8659 Personal history of other mental and behavioral disorders: Secondary | ICD-10-CM | POA: Insufficient documentation

## 2015-12-15 DIAGNOSIS — I509 Heart failure, unspecified: Secondary | ICD-10-CM | POA: Diagnosis not present

## 2015-12-15 DIAGNOSIS — Z862 Personal history of diseases of the blood and blood-forming organs and certain disorders involving the immune mechanism: Secondary | ICD-10-CM | POA: Diagnosis not present

## 2015-12-15 DIAGNOSIS — R0981 Nasal congestion: Secondary | ICD-10-CM | POA: Diagnosis not present

## 2015-12-15 DIAGNOSIS — I129 Hypertensive chronic kidney disease with stage 1 through stage 4 chronic kidney disease, or unspecified chronic kidney disease: Secondary | ICD-10-CM | POA: Insufficient documentation

## 2015-12-15 DIAGNOSIS — J45909 Unspecified asthma, uncomplicated: Secondary | ICD-10-CM | POA: Insufficient documentation

## 2015-12-15 MED ORDER — HYDROCODONE-HOMATROPINE 5-1.5 MG/5ML PO SYRP: 5.0000 mL | ORAL_SOLUTION | Freq: Four times a day (QID) | ORAL | Status: DC | PRN

## 2015-12-15 MED ORDER — IPRATROPIUM BROMIDE 0.06 % NA SOLN
2.0000 | NASAL | Status: AC
  Administered 2015-12-15: 2 via NASAL
  Filled 2015-12-15: qty 15

## 2015-12-15 NOTE — ED Provider Notes (Signed)
CSN: [id number]     Arrival date & time 12/15/15  2030 History   First MD Initiated Contact with Patient 12/15/15 2216     Chief Complaint  Patient presents with  . Cough     (Consider location/radiation/quality/duration/timing/severity/associated sxs/prior Treatment) HPI Comments: This a 65 year old female with a history of hypertension, CHF, diabetes, end-stage renal disease on hemodialysis Monday, Wednesday, Friday.  She presents to the emergency room tonight with a month worth of productive cough and 4 days of nasal congestion.  Phlegm is clear. She states that she was seen by her primary care physician 3 weeks ago, was told to either take Mucinex or Delsym, which she states she has tried without any relief.  She's tried Flonase for her nasal congestion as well.  She states she is using her inhaler and does not report any wheezing at this time.  She denies any fever, chills,or extremity swelling.  She states she is compliant with her dialysis.  She is due in the morning  Patient is a 65 y.o. female presenting with cough. The history is provided by the patient.  Cough Cough characteristics:  Non-productive Severity:  Moderate Onset quality:  Gradual Timing:  Intermittent Progression:  Unchanged Chronicity:  Recurrent Smoker: no   Context: upper respiratory infection   Context: not sick contacts   Relieved by:  Nothing Worsened by:  Nothing tried Ineffective treatments:  Cough suppressants and beta-agonist inhaler (flanace) Associated symptoms: rhinorrhea and sinus congestion   Associated symptoms: no chest pain, no chills, no eye discharge, no fever, no myalgias, no shortness of breath, no sore throat and no wheezing   Rhinorrhea:    Quality:  Clear   Severity:  Mild   Timing:  Intermittent   Past Medical History  Diagnosis Date  . Hypertension   . Diverticulitis   . Hyperlipidemia   . Asthma   . Diabetes mellitus     insulin dependent  . Arthritis     knee  . GI bleed  03/31/2013  . CHF (congestive heart failure) ([place])   . Osteoporosis   . Seasonal allergies   . Anxiety   . Abdominal bruit   . Chronic kidney disease     stage 3  . Sleep apnea     can't afford cpap  . Pneumonia   . GERD (gastroesophageal reflux disease)     from medications  . Complication of anesthesia     " after I got home from my last procedure, I started itching."  . Anemia    Past Surgical History  Procedure Laterality Date  . Abdominal hysterectomy  1993`  . Esophagogastroduodenoscopy N/A 03/31/2013    Procedure: ESOPHAGOGASTRODUODENOSCOPY (EGD);  Surgeon: Gatha Mayer, MD;  Location: [facility] ENDOSCOPY;  Service: Endoscopy;  Laterality: N/A;  . Eye surgery      laser surgery  . Bascilic vein transposition Left 07/10/2014    Procedure: BASCILIC VEIN TRANSPOSITION;  Surgeon: Angelia Mould, MD;  Location: [place];  Service: Vascular;  Laterality: Left;  . Fistulogram Left 10/29/2014    Procedure: FISTULOGRAM;  Surgeon: Angelia Mould, MD;  Location: [facility] CATH LAB;  Service: Cardiovascular;  Laterality: Left;  . Bascilic vein transposition Right 11/08/2014    Procedure: FIRST STAGE BASILIC VEIN TRANSPOSITION;  Surgeon: Angelia Mould, MD;  Location: [place];  Service: Vascular;  Laterality: Right;  . Bascilic vein transposition Right 01/18/2015    Procedure: SECOND STAGE BASILIC VEIN TRANSPOSITION;  Surgeon: Angelia Mould, MD;  Location:  [facility] OR;  Service: Vascular;  Laterality: Right;  . Patch angioplasty Right 01/18/2015    Procedure: BASILIC VEIN PATCH ANGIOPLASTY USING VASCUGUARD PATCH;  Surgeon: Angelia Mould, MD;  Location: [facility] OR;  Service: Vascular;  Laterality: Right;   Family History  Problem Relation Age of Onset  . Colon cancer Neg Hx   . Other Mother     not sure of cause of death  . Diabetes Father   . Pancreatic cancer Maternal Grandmother    Social History  Substance Use Topics  . Smoking status: Former Smoker -- 0.35 packs/day for  40 years    Types: Cigarettes    Quit date: 05/10/2012  . Smokeless tobacco: Never Used  . Alcohol Use: No   OB History    No data available     Review of Systems  Constitutional: Negative for fever and chills.  HENT: Positive for congestion and rhinorrhea. Negative for postnasal drip, sinus pressure and sore throat.   Eyes: Negative for discharge.  Respiratory: Positive for cough. Negative for shortness of breath and wheezing.   Cardiovascular: Negative for chest pain and leg swelling.  Musculoskeletal: Negative for myalgias.  All other systems reviewed and are negative.     Allergies  Lisinopril and Prednisone  Home Medications   Prior to Admission medications   Medication Sig Start Date End Date Taking? Authorizing Provider  albuterol (PROVENTIL HFA;VENTOLIN HFA) 108 (90 BASE) MCG/ACT inhaler Inhale 1-2 puffs into the lungs every 6 (six) hours as needed for wheezing or shortness of breath.   Yes Historical Provider, MD  amLODipine (NORVASC) 2.5 MG tablet Take 1 tablet (2.5 mg total) by mouth daily. 07/03/15  Yes Shanker Kristeen Mans, MD  aspirin 81 MG chewable tablet Chew 81 mg by mouth daily.   Yes Historical Provider, MD  atorvastatin (LIPITOR) 20 MG tablet Take 20 mg by mouth daily at 6 PM.   Yes Historical Provider, MD  budesonide-formoterol (SYMBICORT) 160-4.5 MCG/ACT inhaler Inhale 1 puff into the lungs every evening.   Yes Historical Provider, MD  carvedilol (COREG) 12.5 MG tablet Take 2 tablets (25 mg total) by mouth 2 (two) times daily with a meal. 07/03/15  Yes Shanker Kristeen Mans, MD  insulin aspart (NOVOLOG) 100 UNIT/ML injection Inject 0-8 Units into the skin 3 (three) times daily before meals. Sliding scale   Yes Historical Provider, MD  isosorbide-hydrALAZINE (BIDIL) 20-37.5 MG tablet Take 1 tablet by mouth 2 (two) times daily. 07/03/15  Yes Shanker Kristeen Mans, MD  LEVEMIR FLEXTOUCH 100 UNIT/ML Pen Inject 10 Units into the muscle at bedtime.  01/16/15  Yes Historical  Provider, MD  loratadine (CLARITIN) 10 MG tablet Take 10 mg by mouth daily as needed for allergies.   Yes Historical Provider, MD  multivitamin (RENA-VIT) TABS tablet Take 1 tablet by mouth daily. 01/24/15  Yes Historical Provider, MD  HYDROcodone-homatropine (HYCODAN) 5-1.5 MG/5ML syrup Take 5 mLs by mouth every 6 (six) hours as needed for cough. 12/15/15   Junius Creamer, NP   BP 139/61 mmHg  Pulse 81  Temp(Src) 98.3 F (36.8 C) (Oral)  Resp 16  SpO2 99% Physical Exam  Constitutional: She appears well-developed and well-nourished. No distress.  HENT:  Head: Normocephalic and atraumatic.  Nose: Right sinus exhibits no maxillary sinus tenderness and no frontal sinus tenderness. Left sinus exhibits no maxillary sinus tenderness and no frontal sinus tenderness.  Mouth/Throat: Oropharynx is clear and moist.  Eyes: Pupils are equal, round, and reactive to light.  Neck: Normal  range of motion.  Cardiovascular: Normal rate.   Pulmonary/Chest: Effort normal. No respiratory distress. She has no wheezes.  cough  Abdominal: Soft.  Musculoskeletal: Normal range of motion.  Lymphadenopathy:    She has no cervical adenopathy.  Neurological: She is alert.  Skin: Skin is warm and dry. She is not diaphoretic.  Nursing note and vitals reviewed.   ED Course  Procedures (including critical care time) Labs Review Labs Reviewed - No data to display  Imaging Review Dg Chest 2 View  12/15/2015  CLINICAL DATA:  Cough, central chest pain with coughing and sneezing for 3 weeks. EXAM: CHEST  2 VIEW COMPARISON:  10/09/2015 FINDINGS: Heart is normal in size, atherosclerosis and tortuosity of the thoracic aorta. Decreased vascular congestion and pulmonary edema from prior exam. No confluent airspace disease. Minimal atelectasis left lung base. No pleural effusion or pneumothorax. Stable appearance of the osseous structures. IMPRESSION: Decreased vascular congestion and pulmonary edema from prior exam. Minimal  left basilar atelectasis. No evidence of pneumonia. Electronically Signed   By: Jeb Levering M.D.   On: 12/15/2015 22:24   I have personally reviewed and evaluated these images and lab results as part of my medical decision-making.   EKG Interpretation None      MDM  Patient's chest x-ray reviewed.  No sign of failure or pneumonia.  She will be treated with Hycodan as a cough suppressant as well as being provided with Atrovent nasal inhaler for her congestion.  She has been instructed to follow-up with her primary care physician Final diagnoses:  Cough  Nasal congestion         Junius Creamer, NP 12/15/15 [place], MD 12/17/15 [id number]

## 2015-12-15 NOTE — ED Notes (Signed)
Pt reports persistent, productive cough for a month with no relief from OTC meds or cough meds prescribed by her PCP. She reports watery eyes, ears being stopped up, itchy throat, sneezing and reports her cough has a bad taste.

## 2015-12-15 NOTE — ED Notes (Signed)
Pt is in stable condition upon d/c and ambulates from ED. 

## 2015-12-15 NOTE — Discharge Instructions (Signed)
U been given an Atrovent inhaler for your nasal congestion.  Please use this as follows 1 spray to each nostril twice a day for the next 5 days.  You've also been given a prescription for Hycodan, which should suppress your cough.  Please take this as directed.  Please make an appointment with your primary care physician for follow-up

## 2016-01-10 DIAGNOSIS — Z1159 Encounter for screening for other viral diseases: Secondary | ICD-10-CM

## 2016-02-26 DIAGNOSIS — E1122 Type 2 diabetes mellitus with diabetic chronic kidney disease: Secondary | ICD-10-CM | POA: Diagnosis not present

## 2016-02-26 DIAGNOSIS — N2581 Secondary hyperparathyroidism of renal origin: Secondary | ICD-10-CM | POA: Diagnosis not present

## 2016-02-26 DIAGNOSIS — D631 Anemia in chronic kidney disease: Secondary | ICD-10-CM | POA: Diagnosis not present

## 2016-02-26 DIAGNOSIS — D509 Iron deficiency anemia, unspecified: Secondary | ICD-10-CM | POA: Diagnosis not present

## 2016-02-26 DIAGNOSIS — N186 End stage renal disease: Secondary | ICD-10-CM | POA: Diagnosis not present

## 2016-02-27 DIAGNOSIS — I871 Compression of vein: Secondary | ICD-10-CM | POA: Diagnosis not present

## 2016-02-27 DIAGNOSIS — D509 Iron deficiency anemia, unspecified: Secondary | ICD-10-CM | POA: Diagnosis not present

## 2016-02-27 DIAGNOSIS — N2581 Secondary hyperparathyroidism of renal origin: Secondary | ICD-10-CM | POA: Diagnosis not present

## 2016-02-27 DIAGNOSIS — T82858D Stenosis of vascular prosthetic devices, implants and grafts, subsequent encounter: Secondary | ICD-10-CM | POA: Diagnosis not present

## 2016-02-27 DIAGNOSIS — Z992 Dependence on renal dialysis: Secondary | ICD-10-CM | POA: Diagnosis not present

## 2016-02-27 DIAGNOSIS — N186 End stage renal disease: Secondary | ICD-10-CM | POA: Diagnosis not present

## 2016-02-27 DIAGNOSIS — E1122 Type 2 diabetes mellitus with diabetic chronic kidney disease: Secondary | ICD-10-CM | POA: Diagnosis not present

## 2016-02-27 DIAGNOSIS — D631 Anemia in chronic kidney disease: Secondary | ICD-10-CM | POA: Diagnosis not present

## 2016-02-28 DIAGNOSIS — T82858D Stenosis of vascular prosthetic devices, implants and grafts, subsequent encounter: Secondary | ICD-10-CM | POA: Diagnosis not present

## 2016-02-28 DIAGNOSIS — Z992 Dependence on renal dialysis: Secondary | ICD-10-CM | POA: Diagnosis not present

## 2016-02-28 DIAGNOSIS — N186 End stage renal disease: Secondary | ICD-10-CM | POA: Diagnosis not present

## 2016-02-28 DIAGNOSIS — I871 Compression of vein: Secondary | ICD-10-CM | POA: Diagnosis not present

## 2016-02-29 DIAGNOSIS — D631 Anemia in chronic kidney disease: Secondary | ICD-10-CM | POA: Diagnosis not present

## 2016-02-29 DIAGNOSIS — E1122 Type 2 diabetes mellitus with diabetic chronic kidney disease: Secondary | ICD-10-CM | POA: Diagnosis not present

## 2016-02-29 DIAGNOSIS — T829XXA Unspecified complication of cardiac and vascular prosthetic device, implant and graft, initial encounter: Secondary | ICD-10-CM | POA: Insufficient documentation

## 2016-02-29 DIAGNOSIS — D509 Iron deficiency anemia, unspecified: Secondary | ICD-10-CM | POA: Diagnosis not present

## 2016-02-29 DIAGNOSIS — N2581 Secondary hyperparathyroidism of renal origin: Secondary | ICD-10-CM | POA: Diagnosis not present

## 2016-02-29 DIAGNOSIS — N186 End stage renal disease: Secondary | ICD-10-CM | POA: Diagnosis not present

## 2016-03-02 DIAGNOSIS — N186 End stage renal disease: Secondary | ICD-10-CM | POA: Diagnosis not present

## 2016-03-02 DIAGNOSIS — N2581 Secondary hyperparathyroidism of renal origin: Secondary | ICD-10-CM | POA: Diagnosis not present

## 2016-03-02 DIAGNOSIS — D631 Anemia in chronic kidney disease: Secondary | ICD-10-CM | POA: Diagnosis not present

## 2016-03-02 DIAGNOSIS — E1122 Type 2 diabetes mellitus with diabetic chronic kidney disease: Secondary | ICD-10-CM | POA: Diagnosis not present

## 2016-03-02 DIAGNOSIS — D509 Iron deficiency anemia, unspecified: Secondary | ICD-10-CM | POA: Diagnosis not present

## 2016-03-04 DIAGNOSIS — N2581 Secondary hyperparathyroidism of renal origin: Secondary | ICD-10-CM | POA: Diagnosis not present

## 2016-03-04 DIAGNOSIS — D631 Anemia in chronic kidney disease: Secondary | ICD-10-CM | POA: Diagnosis not present

## 2016-03-04 DIAGNOSIS — E1122 Type 2 diabetes mellitus with diabetic chronic kidney disease: Secondary | ICD-10-CM | POA: Diagnosis not present

## 2016-03-04 DIAGNOSIS — D509 Iron deficiency anemia, unspecified: Secondary | ICD-10-CM | POA: Diagnosis not present

## 2016-03-04 DIAGNOSIS — N186 End stage renal disease: Secondary | ICD-10-CM | POA: Diagnosis not present

## 2016-03-06 DIAGNOSIS — N186 End stage renal disease: Secondary | ICD-10-CM | POA: Diagnosis not present

## 2016-03-06 DIAGNOSIS — D509 Iron deficiency anemia, unspecified: Secondary | ICD-10-CM | POA: Diagnosis not present

## 2016-03-06 DIAGNOSIS — D631 Anemia in chronic kidney disease: Secondary | ICD-10-CM | POA: Diagnosis not present

## 2016-03-06 DIAGNOSIS — E1122 Type 2 diabetes mellitus with diabetic chronic kidney disease: Secondary | ICD-10-CM | POA: Diagnosis not present

## 2016-03-06 DIAGNOSIS — N2581 Secondary hyperparathyroidism of renal origin: Secondary | ICD-10-CM | POA: Diagnosis not present

## 2016-03-09 DIAGNOSIS — N186 End stage renal disease: Secondary | ICD-10-CM | POA: Diagnosis not present

## 2016-03-09 DIAGNOSIS — N2581 Secondary hyperparathyroidism of renal origin: Secondary | ICD-10-CM | POA: Diagnosis not present

## 2016-03-09 DIAGNOSIS — E1122 Type 2 diabetes mellitus with diabetic chronic kidney disease: Secondary | ICD-10-CM | POA: Diagnosis not present

## 2016-03-09 DIAGNOSIS — D631 Anemia in chronic kidney disease: Secondary | ICD-10-CM | POA: Diagnosis not present

## 2016-03-09 DIAGNOSIS — D509 Iron deficiency anemia, unspecified: Secondary | ICD-10-CM | POA: Diagnosis not present

## 2016-03-11 DIAGNOSIS — N186 End stage renal disease: Secondary | ICD-10-CM | POA: Diagnosis not present

## 2016-03-11 DIAGNOSIS — E1122 Type 2 diabetes mellitus with diabetic chronic kidney disease: Secondary | ICD-10-CM | POA: Diagnosis not present

## 2016-03-11 DIAGNOSIS — D631 Anemia in chronic kidney disease: Secondary | ICD-10-CM | POA: Diagnosis not present

## 2016-03-11 DIAGNOSIS — N2581 Secondary hyperparathyroidism of renal origin: Secondary | ICD-10-CM | POA: Diagnosis not present

## 2016-03-11 DIAGNOSIS — D509 Iron deficiency anemia, unspecified: Secondary | ICD-10-CM | POA: Diagnosis not present

## 2016-03-13 DIAGNOSIS — D631 Anemia in chronic kidney disease: Secondary | ICD-10-CM | POA: Diagnosis not present

## 2016-03-13 DIAGNOSIS — N186 End stage renal disease: Secondary | ICD-10-CM | POA: Diagnosis not present

## 2016-03-13 DIAGNOSIS — D509 Iron deficiency anemia, unspecified: Secondary | ICD-10-CM | POA: Diagnosis not present

## 2016-03-13 DIAGNOSIS — E1122 Type 2 diabetes mellitus with diabetic chronic kidney disease: Secondary | ICD-10-CM | POA: Diagnosis not present

## 2016-03-13 DIAGNOSIS — N2581 Secondary hyperparathyroidism of renal origin: Secondary | ICD-10-CM | POA: Diagnosis not present

## 2016-03-16 DIAGNOSIS — E1122 Type 2 diabetes mellitus with diabetic chronic kidney disease: Secondary | ICD-10-CM | POA: Diagnosis not present

## 2016-03-16 DIAGNOSIS — D509 Iron deficiency anemia, unspecified: Secondary | ICD-10-CM | POA: Diagnosis not present

## 2016-03-16 DIAGNOSIS — D631 Anemia in chronic kidney disease: Secondary | ICD-10-CM | POA: Diagnosis not present

## 2016-03-16 DIAGNOSIS — N186 End stage renal disease: Secondary | ICD-10-CM | POA: Diagnosis not present

## 2016-03-16 DIAGNOSIS — N2581 Secondary hyperparathyroidism of renal origin: Secondary | ICD-10-CM | POA: Diagnosis not present

## 2016-03-18 DIAGNOSIS — N2581 Secondary hyperparathyroidism of renal origin: Secondary | ICD-10-CM | POA: Diagnosis not present

## 2016-03-18 DIAGNOSIS — D509 Iron deficiency anemia, unspecified: Secondary | ICD-10-CM | POA: Diagnosis not present

## 2016-03-18 DIAGNOSIS — E1122 Type 2 diabetes mellitus with diabetic chronic kidney disease: Secondary | ICD-10-CM | POA: Diagnosis not present

## 2016-03-18 DIAGNOSIS — N186 End stage renal disease: Secondary | ICD-10-CM | POA: Diagnosis not present

## 2016-03-18 DIAGNOSIS — D631 Anemia in chronic kidney disease: Secondary | ICD-10-CM | POA: Diagnosis not present

## 2016-03-20 DIAGNOSIS — N186 End stage renal disease: Secondary | ICD-10-CM | POA: Diagnosis not present

## 2016-03-20 DIAGNOSIS — D509 Iron deficiency anemia, unspecified: Secondary | ICD-10-CM | POA: Diagnosis not present

## 2016-03-20 DIAGNOSIS — D631 Anemia in chronic kidney disease: Secondary | ICD-10-CM | POA: Diagnosis not present

## 2016-03-20 DIAGNOSIS — N2581 Secondary hyperparathyroidism of renal origin: Secondary | ICD-10-CM | POA: Diagnosis not present

## 2016-03-20 DIAGNOSIS — E1122 Type 2 diabetes mellitus with diabetic chronic kidney disease: Secondary | ICD-10-CM | POA: Diagnosis not present

## 2016-03-23 DIAGNOSIS — N186 End stage renal disease: Secondary | ICD-10-CM | POA: Diagnosis not present

## 2016-03-23 DIAGNOSIS — E1122 Type 2 diabetes mellitus with diabetic chronic kidney disease: Secondary | ICD-10-CM | POA: Diagnosis not present

## 2016-03-23 DIAGNOSIS — D631 Anemia in chronic kidney disease: Secondary | ICD-10-CM | POA: Diagnosis not present

## 2016-03-23 DIAGNOSIS — D509 Iron deficiency anemia, unspecified: Secondary | ICD-10-CM | POA: Diagnosis not present

## 2016-03-23 DIAGNOSIS — N2581 Secondary hyperparathyroidism of renal origin: Secondary | ICD-10-CM | POA: Diagnosis not present

## 2016-03-25 DIAGNOSIS — D509 Iron deficiency anemia, unspecified: Secondary | ICD-10-CM | POA: Diagnosis not present

## 2016-03-25 DIAGNOSIS — N186 End stage renal disease: Secondary | ICD-10-CM | POA: Diagnosis not present

## 2016-03-25 DIAGNOSIS — N2581 Secondary hyperparathyroidism of renal origin: Secondary | ICD-10-CM | POA: Diagnosis not present

## 2016-03-25 DIAGNOSIS — D631 Anemia in chronic kidney disease: Secondary | ICD-10-CM | POA: Diagnosis not present

## 2016-03-25 DIAGNOSIS — E1122 Type 2 diabetes mellitus with diabetic chronic kidney disease: Secondary | ICD-10-CM | POA: Diagnosis not present

## 2016-03-26 DIAGNOSIS — N186 End stage renal disease: Secondary | ICD-10-CM | POA: Diagnosis not present

## 2016-03-26 DIAGNOSIS — I159 Secondary hypertension, unspecified: Secondary | ICD-10-CM | POA: Diagnosis not present

## 2016-03-26 DIAGNOSIS — Z992 Dependence on renal dialysis: Secondary | ICD-10-CM | POA: Diagnosis not present

## 2016-03-27 DIAGNOSIS — N2581 Secondary hyperparathyroidism of renal origin: Secondary | ICD-10-CM | POA: Diagnosis not present

## 2016-03-27 DIAGNOSIS — Z23 Encounter for immunization: Secondary | ICD-10-CM | POA: Diagnosis not present

## 2016-03-27 DIAGNOSIS — D509 Iron deficiency anemia, unspecified: Secondary | ICD-10-CM | POA: Diagnosis not present

## 2016-03-27 DIAGNOSIS — N186 End stage renal disease: Secondary | ICD-10-CM | POA: Diagnosis not present

## 2016-03-27 DIAGNOSIS — E1122 Type 2 diabetes mellitus with diabetic chronic kidney disease: Secondary | ICD-10-CM | POA: Diagnosis not present

## 2016-03-30 DIAGNOSIS — Z23 Encounter for immunization: Secondary | ICD-10-CM | POA: Diagnosis not present

## 2016-03-30 DIAGNOSIS — E1122 Type 2 diabetes mellitus with diabetic chronic kidney disease: Secondary | ICD-10-CM | POA: Diagnosis not present

## 2016-03-30 DIAGNOSIS — D509 Iron deficiency anemia, unspecified: Secondary | ICD-10-CM | POA: Diagnosis not present

## 2016-03-30 DIAGNOSIS — N2581 Secondary hyperparathyroidism of renal origin: Secondary | ICD-10-CM | POA: Diagnosis not present

## 2016-03-30 DIAGNOSIS — N186 End stage renal disease: Secondary | ICD-10-CM | POA: Diagnosis not present

## 2016-04-01 DIAGNOSIS — D509 Iron deficiency anemia, unspecified: Secondary | ICD-10-CM | POA: Diagnosis not present

## 2016-04-01 DIAGNOSIS — E1122 Type 2 diabetes mellitus with diabetic chronic kidney disease: Secondary | ICD-10-CM | POA: Diagnosis not present

## 2016-04-01 DIAGNOSIS — N186 End stage renal disease: Secondary | ICD-10-CM | POA: Diagnosis not present

## 2016-04-01 DIAGNOSIS — N2581 Secondary hyperparathyroidism of renal origin: Secondary | ICD-10-CM | POA: Diagnosis not present

## 2016-04-01 DIAGNOSIS — Z23 Encounter for immunization: Secondary | ICD-10-CM | POA: Diagnosis not present

## 2016-04-03 ENCOUNTER — Encounter: Payer: Self-pay | Admitting: Vascular Surgery

## 2016-04-03 DIAGNOSIS — Z23 Encounter for immunization: Secondary | ICD-10-CM | POA: Diagnosis not present

## 2016-04-03 DIAGNOSIS — E1122 Type 2 diabetes mellitus with diabetic chronic kidney disease: Secondary | ICD-10-CM | POA: Diagnosis not present

## 2016-04-03 DIAGNOSIS — D509 Iron deficiency anemia, unspecified: Secondary | ICD-10-CM | POA: Diagnosis not present

## 2016-04-03 DIAGNOSIS — N2581 Secondary hyperparathyroidism of renal origin: Secondary | ICD-10-CM | POA: Diagnosis not present

## 2016-04-03 DIAGNOSIS — N186 End stage renal disease: Secondary | ICD-10-CM | POA: Diagnosis not present

## 2016-04-06 DIAGNOSIS — D509 Iron deficiency anemia, unspecified: Secondary | ICD-10-CM | POA: Diagnosis not present

## 2016-04-06 DIAGNOSIS — N2581 Secondary hyperparathyroidism of renal origin: Secondary | ICD-10-CM | POA: Diagnosis not present

## 2016-04-06 DIAGNOSIS — E1122 Type 2 diabetes mellitus with diabetic chronic kidney disease: Secondary | ICD-10-CM | POA: Diagnosis not present

## 2016-04-06 DIAGNOSIS — N186 End stage renal disease: Secondary | ICD-10-CM | POA: Diagnosis not present

## 2016-04-06 DIAGNOSIS — Z23 Encounter for immunization: Secondary | ICD-10-CM | POA: Diagnosis not present

## 2016-04-08 DIAGNOSIS — D509 Iron deficiency anemia, unspecified: Secondary | ICD-10-CM | POA: Diagnosis not present

## 2016-04-08 DIAGNOSIS — N186 End stage renal disease: Secondary | ICD-10-CM | POA: Diagnosis not present

## 2016-04-08 DIAGNOSIS — Z23 Encounter for immunization: Secondary | ICD-10-CM | POA: Diagnosis not present

## 2016-04-08 DIAGNOSIS — N2581 Secondary hyperparathyroidism of renal origin: Secondary | ICD-10-CM | POA: Diagnosis not present

## 2016-04-08 DIAGNOSIS — E1122 Type 2 diabetes mellitus with diabetic chronic kidney disease: Secondary | ICD-10-CM | POA: Diagnosis not present

## 2016-04-09 ENCOUNTER — Ambulatory Visit (INDEPENDENT_AMBULATORY_CARE_PROVIDER_SITE_OTHER): Payer: Medicare Other | Admitting: Vascular Surgery

## 2016-04-09 ENCOUNTER — Other Ambulatory Visit: Payer: Self-pay | Admitting: *Deleted

## 2016-04-09 ENCOUNTER — Encounter: Payer: Self-pay | Admitting: Vascular Surgery

## 2016-04-09 VITALS — BP 181/78 | HR 83 | Temp 97.8°F | Resp 16 | Ht 64.0 in | Wt 143.0 lb

## 2016-04-09 DIAGNOSIS — Z992 Dependence on renal dialysis: Secondary | ICD-10-CM | POA: Diagnosis not present

## 2016-04-09 DIAGNOSIS — N186 End stage renal disease: Secondary | ICD-10-CM

## 2016-04-09 NOTE — Progress Notes (Signed)
Referring Physician: Dr. Arty Baumgartner  Patient name: Destiny Day MRN: [id number] DOB: 12-Feb-1951 Sex: female  REASON FOR CONSULT: Poorly functioning right arm fistula  HPI: Destiny Day is a 65 y.o. female,  who previously had a left brachiocephalic AV fistula which never matured. She subsequently had a right basilic vein fistula placed in 2016. This did eventually mature but has required several recent symptoms percutaneous interventions. Patient also has significant pain when cannulating the fistula. She presents today for placement of a new hemodialysis access. She is currently dialyzing via a right-sided catheter. Other medical problems include anemia, arthritis, congestive failure, diabetes, hyperlipidemia, hypertension, all of which have been stable.  Past Medical History:  Diagnosis Date  . Abdominal bruit   . Anemia   . Anxiety   . Arthritis    knee  . Asthma   . CHF (congestive heart failure) ([place])   . Chronic kidney disease    stage 3  . Complication of anesthesia    " after I got home from my last procedure, I started itching."  . Diabetes mellitus    insulin dependent  . Diverticulitis   . GERD (gastroesophageal reflux disease)    from medications  . GI bleed 03/31/2013  . Hyperlipidemia   . Hypertension   . Osteoporosis   . Pneumonia   . Seasonal allergies   . Sleep apnea    can't afford cpap   Past Surgical History:  Procedure Laterality Date  . ABDOMINAL HYSTERECTOMY  1993`  . [place] TRANSPOSITION Left 07/10/2014   Procedure: BASCILIC VEIN TRANSPOSITION;  Surgeon: Angelia Mould, MD;  Location: [place];  Service: Vascular;  Laterality: Left;  . BASCILIC VEIN TRANSPOSITION Right 11/08/2014   Procedure: FIRST STAGE BASILIC VEIN TRANSPOSITION;  Surgeon: Angelia Mould, MD;  Location: [place];  Service: Vascular;  Laterality: Right;  . BASCILIC VEIN TRANSPOSITION Right 01/18/2015   Procedure: SECOND STAGE BASILIC VEIN TRANSPOSITION;   Surgeon: Angelia Mould, MD;  Location: [facility] OR;  Service: Vascular;  Laterality: Right;  . ESOPHAGOGASTRODUODENOSCOPY N/A 03/31/2013   Procedure: ESOPHAGOGASTRODUODENOSCOPY (EGD);  Surgeon: Gatha Mayer, MD;  Location: [facility] ENDOSCOPY;  Service: Endoscopy;  Laterality: N/A;  . EYE SURGERY     laser surgery  . FISTULOGRAM Left 10/29/2014   Procedure: FISTULOGRAM;  Surgeon: Angelia Mould, MD;  Location: [facility] CATH LAB;  Service: Cardiovascular;  Laterality: Left;  . PATCH ANGIOPLASTY Right 01/18/2015   Procedure: BASILIC VEIN PATCH ANGIOPLASTY USING VASCUGUARD PATCH;  Surgeon: Angelia Mould, MD;  Location: [facility] OR;  Service: Vascular;  Laterality: Right;    Family History  Problem Relation Age of Onset  . Colon cancer Neg Hx   . Other Mother     not sure of cause of death  . Diabetes Father   . Pancreatic cancer Maternal Grandmother     SOCIAL HISTORY: Social History   Social History  . Marital status: Married    Spouse name: [name]  . Number of children: 4  . Years of education: some collg   Occupational History  . Retired    Social History Main Topics  . Smoking status: Former Smoker    Packs/day: 0.35    Years: 40.00    Types: Cigarettes    Quit date: 05/10/2012  . Smokeless tobacco: Never Used  . Alcohol use No  . Drug use:      Comment: marijuana; quit in early 1980's  . Sexual activity: Yes   Other  Topics Concern  . Not on file   Social History Narrative   Patient lives at home with spouse.   Caffeine Use: 1 cup daily    Allergies  Allergen Reactions  . Lisinopril Cough  . Prednisone Other (See Comments)    Excessive fluid buildup    Current Outpatient Prescriptions  Medication Sig Dispense Refill  . albuterol (PROVENTIL HFA;VENTOLIN HFA) 108 (90 BASE) MCG/ACT inhaler Inhale 1-2 puffs into the lungs every 6 (six) hours as needed for wheezing or shortness of breath.    Marland Kitchen amLODipine (NORVASC) 2.5 MG tablet Take 1 tablet (2.5 mg total) by  mouth daily. 30 tablet 0  . aspirin 81 MG chewable tablet Chew 81 mg by mouth daily.    Marland Kitchen atorvastatin (LIPITOR) 20 MG tablet Take 20 mg by mouth daily at 6 PM.    . budesonide-formoterol (SYMBICORT) 160-4.5 MCG/ACT inhaler Inhale 1 puff into the lungs every evening.    . calcium acetate, Phos Binder, (PHOSLYRA) 667 MG/5ML SOLN Take by mouth 3 (three) times daily with meals.    . carvedilol (COREG) 12.5 MG tablet Take 2 tablets (25 mg total) by mouth 2 (two) times daily with a meal. 120 tablet 0  . HYDROcodone-homatropine (HYCODAN) 5-1.5 MG/5ML syrup Take 5 mLs by mouth every 6 (six) hours as needed for cough. 120 mL 0  . insulin aspart (NOVOLOG) 100 UNIT/ML injection Inject 0-8 Units into the skin 3 (three) times daily before meals. Sliding scale    . isosorbide-hydrALAZINE (BIDIL) 20-37.5 MG tablet Take 1 tablet by mouth 2 (two) times daily. 60 tablet 0  . LEVEMIR FLEXTOUCH 100 UNIT/ML Pen Inject 10 Units into the muscle at bedtime.     Marland Kitchen loratadine (CLARITIN) 10 MG tablet Take 10 mg by mouth daily as needed for allergies.    . multivitamin (RENA-VIT) TABS tablet Take 1 tablet by mouth daily.  5   No current facility-administered medications for this visit.     ROS:   General:  No weight loss, Fever, chills  HEENT: No recent headaches, no nasal bleeding, no visual changes, no sore throat  Neurologic: No dizziness, blackouts, seizures. No recent symptoms of stroke or mini- stroke. No recent episodes of slurred speech, or temporary blindness.  Cardiac: No recent episodes of chest pain/pressure, no shortness of breath at rest.  No shortness of breath with exertion.  Denies history of atrial fibrillation or irregular heartbeat  Vascular: No history of rest pain in feet.  No history of claudication.  No history of non-healing ulcer, No history of DVT   Pulmonary: No home oxygen, no productive cough, no hemoptysis,  No asthma or wheezing  Musculoskeletal:  [ ]  Arthritis, [ ]  Low back pain,   [X]  Joint pain  Hematologic:No history of hypercoagulable state.  No history of easy bleeding.  No history of anemia  Gastrointestinal: No hematochezia or melena,  No gastroesophageal reflux, no trouble swallowing  Urinary: [X]  chronic Kidney disease, [X]  on HD - [X]  MWF or [ ]  TTHS, [ ]  Burning with urination, [ ]  Frequent urination, [ ]  Difficulty urinating;   Skin: No rashes  Psychological: No history of anxiety,  No history of depression   Physical Examination  Vitals:   04/09/16 1028 04/09/16 1029  BP: (!) 191/79 (!) 181/78  Pulse: 83   Resp: 16   Temp: 97.8 F (36.6 C)   TempSrc: Oral   SpO2: 100%   Weight: 143 lb (64.9 kg)   Height: 5\' 4"  (1.626 m)  Body mass index is 24.55 kg/m.  General:  Alert and oriented, no acute distress HEENT: Normal Neck: No bruit or JVD Pulmonary: Clear to auscultation bilaterally Cardiac: Regular Rate and Rhythm without murmur Abdomen: Soft, non-tender, non-distended, no mass Skin: No rash Extremity Pulses:  2+ radial, brachial, Pulsatile fistula left upper arm to small to cannulate, palpable thrill in fistula right upper extremity audible bruit fistula is palpable beneath the scar Musculoskeletal: No deformity or edema  Neurologic: Upper and lower extremity motor 5/5 and symmetric  ASSESSMENT:  Patient needs long-term hemodialysis access.   PLAN:  We will place a left arm AV graft. I will also ligate her left brachiocephalic fistula at the same time to reduce risk of steal. These are catheter for now. But if her fistula in the right arm functions reasonably this could be used as an alternative access until the graft matures in her arm. Risks benefits possible, patient's procedure details were trying to the patient today including not limited to graft thrombosis risk 25% per year bleeding infection ischemic steal. She understands and wishes to proceed. This is scheduled for 04/21/2016.   Ruta Hinds, MD [facility] [place] Office: [phone] Pager: [phone]

## 2016-04-10 ENCOUNTER — Encounter: Payer: Self-pay | Admitting: *Deleted

## 2016-04-10 DIAGNOSIS — N2581 Secondary hyperparathyroidism of renal origin: Secondary | ICD-10-CM | POA: Diagnosis not present

## 2016-04-10 DIAGNOSIS — E1122 Type 2 diabetes mellitus with diabetic chronic kidney disease: Secondary | ICD-10-CM | POA: Diagnosis not present

## 2016-04-10 DIAGNOSIS — Z23 Encounter for immunization: Secondary | ICD-10-CM | POA: Diagnosis not present

## 2016-04-10 DIAGNOSIS — N186 End stage renal disease: Secondary | ICD-10-CM | POA: Diagnosis not present

## 2016-04-10 DIAGNOSIS — D509 Iron deficiency anemia, unspecified: Secondary | ICD-10-CM | POA: Diagnosis not present

## 2016-04-13 DIAGNOSIS — D509 Iron deficiency anemia, unspecified: Secondary | ICD-10-CM | POA: Diagnosis not present

## 2016-04-13 DIAGNOSIS — E1122 Type 2 diabetes mellitus with diabetic chronic kidney disease: Secondary | ICD-10-CM | POA: Diagnosis not present

## 2016-04-13 DIAGNOSIS — N186 End stage renal disease: Secondary | ICD-10-CM | POA: Diagnosis not present

## 2016-04-13 DIAGNOSIS — N2581 Secondary hyperparathyroidism of renal origin: Secondary | ICD-10-CM | POA: Diagnosis not present

## 2016-04-13 DIAGNOSIS — Z23 Encounter for immunization: Secondary | ICD-10-CM | POA: Diagnosis not present

## 2016-04-15 DIAGNOSIS — Z23 Encounter for immunization: Secondary | ICD-10-CM | POA: Diagnosis not present

## 2016-04-15 DIAGNOSIS — E1122 Type 2 diabetes mellitus with diabetic chronic kidney disease: Secondary | ICD-10-CM | POA: Diagnosis not present

## 2016-04-15 DIAGNOSIS — N2581 Secondary hyperparathyroidism of renal origin: Secondary | ICD-10-CM | POA: Diagnosis not present

## 2016-04-15 DIAGNOSIS — D509 Iron deficiency anemia, unspecified: Secondary | ICD-10-CM | POA: Diagnosis not present

## 2016-04-15 DIAGNOSIS — N186 End stage renal disease: Secondary | ICD-10-CM | POA: Diagnosis not present

## 2016-04-17 DIAGNOSIS — N186 End stage renal disease: Secondary | ICD-10-CM | POA: Diagnosis not present

## 2016-04-17 DIAGNOSIS — N2581 Secondary hyperparathyroidism of renal origin: Secondary | ICD-10-CM | POA: Diagnosis not present

## 2016-04-17 DIAGNOSIS — E1122 Type 2 diabetes mellitus with diabetic chronic kidney disease: Secondary | ICD-10-CM | POA: Diagnosis not present

## 2016-04-17 DIAGNOSIS — Z23 Encounter for immunization: Secondary | ICD-10-CM | POA: Diagnosis not present

## 2016-04-17 DIAGNOSIS — D509 Iron deficiency anemia, unspecified: Secondary | ICD-10-CM | POA: Diagnosis not present

## 2016-04-20 ENCOUNTER — Encounter (HOSPITAL_COMMUNITY): Payer: Self-pay | Admitting: *Deleted

## 2016-04-20 DIAGNOSIS — D509 Iron deficiency anemia, unspecified: Secondary | ICD-10-CM | POA: Diagnosis not present

## 2016-04-20 DIAGNOSIS — N2581 Secondary hyperparathyroidism of renal origin: Secondary | ICD-10-CM | POA: Diagnosis not present

## 2016-04-20 DIAGNOSIS — E1122 Type 2 diabetes mellitus with diabetic chronic kidney disease: Secondary | ICD-10-CM | POA: Diagnosis not present

## 2016-04-20 DIAGNOSIS — Z23 Encounter for immunization: Secondary | ICD-10-CM | POA: Diagnosis not present

## 2016-04-20 DIAGNOSIS — N186 End stage renal disease: Secondary | ICD-10-CM | POA: Diagnosis not present

## 2016-04-21 ENCOUNTER — Ambulatory Visit (HOSPITAL_COMMUNITY)
Admission: RE | Admit: 2016-04-21 | Discharge: 2016-04-21 | Disposition: A | Discharge status: Home / Self Care | Payer: Medicare Other | Source: Ambulatory Visit | Attending: Vascular Surgery | Admitting: Vascular Surgery

## 2016-04-21 ENCOUNTER — Ambulatory Visit (HOSPITAL_COMMUNITY): Payer: Medicare Other | Admitting: Certified Registered Nurse Anesthetist

## 2016-04-21 ENCOUNTER — Encounter (HOSPITAL_COMMUNITY)
Admission: RE | Disposition: A | Discharge status: Home / Self Care | Payer: Self-pay | Source: Ambulatory Visit | Attending: Vascular Surgery

## 2016-04-21 DIAGNOSIS — Z800 Family history of malignant neoplasm of digestive organs: Secondary | ICD-10-CM | POA: Insufficient documentation

## 2016-04-21 DIAGNOSIS — N185 Chronic kidney disease, stage 5: Secondary | ICD-10-CM | POA: Diagnosis not present

## 2016-04-21 DIAGNOSIS — M810 Age-related osteoporosis without current pathological fracture: Secondary | ICD-10-CM | POA: Diagnosis not present

## 2016-04-21 DIAGNOSIS — Z79899 Other long term (current) drug therapy: Secondary | ICD-10-CM | POA: Insufficient documentation

## 2016-04-21 DIAGNOSIS — I132 Hypertensive heart and chronic kidney disease with heart failure and with stage 5 chronic kidney disease, or end stage renal disease: Secondary | ICD-10-CM | POA: Diagnosis not present

## 2016-04-21 DIAGNOSIS — T82898A Other specified complication of vascular prosthetic devices, implants and grafts, initial encounter: Secondary | ICD-10-CM | POA: Diagnosis not present

## 2016-04-21 DIAGNOSIS — Z794 Long term (current) use of insulin: Secondary | ICD-10-CM | POA: Insufficient documentation

## 2016-04-21 DIAGNOSIS — J45909 Unspecified asthma, uncomplicated: Secondary | ICD-10-CM | POA: Diagnosis not present

## 2016-04-21 DIAGNOSIS — G4730 Sleep apnea, unspecified: Secondary | ICD-10-CM | POA: Insufficient documentation

## 2016-04-21 DIAGNOSIS — N186 End stage renal disease: Secondary | ICD-10-CM | POA: Diagnosis not present

## 2016-04-21 DIAGNOSIS — M1990 Unspecified osteoarthritis, unspecified site: Secondary | ICD-10-CM | POA: Diagnosis not present

## 2016-04-21 DIAGNOSIS — Z992 Dependence on renal dialysis: Secondary | ICD-10-CM | POA: Insufficient documentation

## 2016-04-21 DIAGNOSIS — I509 Heart failure, unspecified: Secondary | ICD-10-CM | POA: Diagnosis not present

## 2016-04-21 DIAGNOSIS — Z87891 Personal history of nicotine dependence: Secondary | ICD-10-CM | POA: Diagnosis not present

## 2016-04-21 DIAGNOSIS — Z9109 Other allergy status, other than to drugs and biological substances: Secondary | ICD-10-CM | POA: Diagnosis not present

## 2016-04-21 DIAGNOSIS — Z833 Family history of diabetes mellitus: Secondary | ICD-10-CM | POA: Insufficient documentation

## 2016-04-21 DIAGNOSIS — E1122 Type 2 diabetes mellitus with diabetic chronic kidney disease: Secondary | ICD-10-CM | POA: Diagnosis not present

## 2016-04-21 DIAGNOSIS — X58XXXA Exposure to other specified factors, initial encounter: Secondary | ICD-10-CM | POA: Insufficient documentation

## 2016-04-21 DIAGNOSIS — K219 Gastro-esophageal reflux disease without esophagitis: Secondary | ICD-10-CM | POA: Diagnosis not present

## 2016-04-21 DIAGNOSIS — E785 Hyperlipidemia, unspecified: Secondary | ICD-10-CM | POA: Insufficient documentation

## 2016-04-21 DIAGNOSIS — Z7982 Long term (current) use of aspirin: Secondary | ICD-10-CM | POA: Diagnosis not present

## 2016-04-21 DIAGNOSIS — Z90710 Acquired absence of both cervix and uterus: Secondary | ICD-10-CM | POA: Diagnosis not present

## 2016-04-21 DIAGNOSIS — F419 Anxiety disorder, unspecified: Secondary | ICD-10-CM | POA: Insufficient documentation

## 2016-04-21 DIAGNOSIS — D649 Anemia, unspecified: Secondary | ICD-10-CM | POA: Insufficient documentation

## 2016-04-21 DIAGNOSIS — N189 Chronic kidney disease, unspecified: Secondary | ICD-10-CM | POA: Diagnosis not present

## 2016-04-21 HISTORY — PX: LIGATION OF ARTERIOVENOUS  FISTULA: SHX5948

## 2016-04-21 HISTORY — PX: AV FISTULA PLACEMENT: SHX1204

## 2016-04-21 HISTORY — DX: Reserved for inherently not codable concepts without codable children: IMO0001

## 2016-04-21 HISTORY — DX: Cervical disc disorder, unspecified, unspecified cervical region: M50.90

## 2016-04-21 LAB — GLUCOSE, CAPILLARY
Glucose-Capillary: 125 mg/dL — ABNORMAL HIGH (ref 65–99)
Glucose-Capillary: 150 mg/dL — ABNORMAL HIGH (ref 65–99)

## 2016-04-21 LAB — POCT I-STAT 4, (NA,K, GLUC, HGB,HCT)
Glucose, Bld: 145 mg/dL — ABNORMAL HIGH (ref 65–99)
HCT: 37 % (ref 36.0–46.0)
Hemoglobin: 12.6 g/dL (ref 12.0–15.0)
Potassium: 4.2 mmol/L (ref 3.5–5.1)
Sodium: 137 mmol/L (ref 135–145)

## 2016-04-21 SURGERY — LIGATION OF ARTERIOVENOUS  FISTULA
Anesthesia: General | Site: Arm Upper | Laterality: Left

## 2016-04-21 MED ORDER — FENTANYL CITRATE (PF) 100 MCG/2ML IJ SOLN
25.0000 ug | INTRAMUSCULAR | Status: DC | PRN
  Administered 2016-04-21: 50 ug via INTRAVENOUS
  Administered 2016-04-21 (×2): 25 ug via INTRAVENOUS

## 2016-04-21 MED ORDER — OXYCODONE HCL 5 MG/5ML PO SOLN: 5.0000 mg | Freq: Once | ORAL | Status: AC | PRN

## 2016-04-21 MED ORDER — LIDOCAINE 2% (20 MG/ML) 5 ML SYRINGE
INTRAMUSCULAR | Status: DC | PRN
  Administered 2016-04-21: 40 mg via INTRAVENOUS

## 2016-04-21 MED ORDER — PHENYLEPHRINE HCL 10 MG/ML IJ SOLN
INTRAMUSCULAR | Status: DC | PRN
  Administered 2016-04-21: 120 ug via INTRAVENOUS
  Administered 2016-04-21 (×2): 80 ug via INTRAVENOUS
  Administered 2016-04-21: 120 ug via INTRAVENOUS

## 2016-04-21 MED ORDER — LIDOCAINE HCL (PF) 1 % IJ SOLN
INTRAMUSCULAR | Status: AC
Start: 2016-04-21 — End: 2016-04-21
  Filled 2016-04-21: qty 30

## 2016-04-21 MED ORDER — PROPOFOL 10 MG/ML IV BOLUS
INTRAVENOUS | Status: DC | PRN
  Administered 2016-04-21: 150 mg via INTRAVENOUS

## 2016-04-21 MED ORDER — ONDANSETRON HCL 4 MG/2ML IJ SOLN: 4.0000 mg | Freq: Once | INTRAMUSCULAR | Status: DC | PRN

## 2016-04-21 MED ORDER — SODIUM CHLORIDE 0.9 % IV SOLN: INTRAVENOUS | Status: DC

## 2016-04-21 MED ORDER — ONDANSETRON HCL 4 MG/2ML IJ SOLN
INTRAMUSCULAR | Status: DC | PRN
  Administered 2016-04-21: 4 mg via INTRAVENOUS

## 2016-04-21 MED ORDER — SODIUM CHLORIDE 0.9 % IV SOLN
INTRAVENOUS | Status: DC | PRN
  Administered 2016-04-21: 500 mL

## 2016-04-21 MED ORDER — DEXTROSE 5 % IV SOLN
1.5000 g | INTRAVENOUS | Status: AC
  Administered 2016-04-21: 1.5 g via INTRAVENOUS

## 2016-04-21 MED ORDER — HEPARIN SODIUM (PORCINE) 1000 UNIT/ML IJ SOLN
INTRAMUSCULAR | Status: DC | PRN
  Administered 2016-04-21: 5000 [IU] via INTRAVENOUS

## 2016-04-21 MED ORDER — OXYCODONE HCL 5 MG PO TABS
ORAL_TABLET | ORAL | Status: AC
  Filled 2016-04-21: qty 2

## 2016-04-21 MED ORDER — FENTANYL CITRATE (PF) 100 MCG/2ML IJ SOLN
INTRAMUSCULAR | Status: DC | PRN
  Administered 2016-04-21 (×3): 50 ug via INTRAVENOUS
  Administered 2016-04-21 (×2): 25 ug via INTRAVENOUS

## 2016-04-21 MED ORDER — OXYCODONE HCL 5 MG PO TABS: 5.0000 mg | ORAL_TABLET | Freq: Four times a day (QID) | ORAL | 0 refills | Status: DC | PRN

## 2016-04-21 MED ORDER — PROTAMINE SULFATE 10 MG/ML IV SOLN
INTRAVENOUS | Status: AC
Start: 2016-04-21 — End: 2016-04-21
  Filled 2016-04-21: qty 10

## 2016-04-21 MED ORDER — SODIUM CHLORIDE 0.9 % IV SOLN
INTRAVENOUS | Status: DC
  Administered 2016-04-21 (×2): via INTRAVENOUS

## 2016-04-21 MED ORDER — MIDAZOLAM HCL 2 MG/2ML IJ SOLN
INTRAMUSCULAR | Status: AC
  Filled 2016-04-21: qty 2

## 2016-04-21 MED ORDER — CHLORHEXIDINE GLUCONATE CLOTH 2 % EX PADS: 6.0000 | MEDICATED_PAD | Freq: Once | CUTANEOUS | Status: DC

## 2016-04-21 MED ORDER — OXYCODONE HCL 5 MG PO TABS
5.0000 mg | ORAL_TABLET | Freq: Once | ORAL | Status: AC | PRN
  Administered 2016-04-21: 5 mg via ORAL

## 2016-04-21 MED ORDER — FENTANYL CITRATE (PF) 100 MCG/2ML IJ SOLN
INTRAMUSCULAR | Status: AC
  Filled 2016-04-21: qty 2

## 2016-04-21 MED ORDER — PROTAMINE SULFATE 10 MG/ML IV SOLN
INTRAVENOUS | Status: DC | PRN
  Administered 2016-04-21: 40 mg via INTRAVENOUS
  Administered 2016-04-21: 10 mg via INTRAVENOUS

## 2016-04-21 MED ORDER — HEPARIN SODIUM (PORCINE) 1000 UNIT/ML IJ SOLN
INTRAMUSCULAR | Status: AC
  Filled 2016-04-21: qty 1

## 2016-04-21 MED ORDER — FENTANYL CITRATE (PF) 100 MCG/2ML IJ SOLN
INTRAMUSCULAR | Status: AC
  Administered 2016-04-21: 25 ug via INTRAVENOUS
  Filled 2016-04-21: qty 2

## 2016-04-21 MED ORDER — HEMOSTATIC AGENTS (NO CHARGE) OPTIME
TOPICAL | Status: DC | PRN
  Administered 2016-04-21: 1 via TOPICAL

## 2016-04-21 MED ORDER — PHENYLEPHRINE HCL 10 MG/ML IJ SOLN
INTRAVENOUS | Status: DC | PRN
  Administered 2016-04-21: 25 ug/min via INTRAVENOUS

## 2016-04-21 MED ORDER — MIDAZOLAM HCL 5 MG/5ML IJ SOLN
INTRAMUSCULAR | Status: DC | PRN
  Administered 2016-04-21: 2 mg via INTRAVENOUS

## 2016-04-21 MED ORDER — 0.9 % SODIUM CHLORIDE (POUR BTL) OPTIME
TOPICAL | Status: DC | PRN
  Administered 2016-04-21: 1000 mL

## 2016-04-21 MED ORDER — OXYCODONE HCL 5 MG PO TABS
5.0000 mg | ORAL_TABLET | Freq: Once | ORAL | Status: AC
  Administered 2016-04-21: 5 mg via ORAL

## 2016-04-21 MED ORDER — DEXTROSE 5 % IV SOLN
INTRAVENOUS | Status: AC
  Filled 2016-04-21: qty 1.5

## 2016-04-21 MED ORDER — THROMBIN 20000 UNITS EX SOLR
CUTANEOUS | Status: AC
Start: 2016-04-21 — End: 2016-04-21
  Filled 2016-04-21: qty 20000

## 2016-04-21 MED ORDER — ONDANSETRON HCL 4 MG/2ML IJ SOLN
INTRAMUSCULAR | Status: AC
  Filled 2016-04-21: qty 2

## 2016-04-21 MED ORDER — THROMBIN 20000 UNITS EX KIT
PACK | CUTANEOUS | Status: AC
Start: 2016-04-21 — End: 2016-04-21
  Filled 2016-04-21: qty 1

## 2016-04-21 SURGICAL SUPPLY — 28 items
ARMBAND PINK RESTRICT EXTREMIT (MISCELLANEOUS) ×4 IMPLANT
CANISTER SUCTION 2500CC (MISCELLANEOUS) ×2 IMPLANT
CANNULA VESSEL 3MM 2 BLNT TIP (CANNULA) ×2 IMPLANT
CLIP TI MEDIUM 6 (CLIP) ×2 IMPLANT
CLIP TI WIDE RED SMALL 6 (CLIP) ×2 IMPLANT
DECANTER SPIKE VIAL GLASS SM (MISCELLANEOUS) ×2 IMPLANT
ELECT REM PT RETURN 9FT ADLT (ELECTROSURGICAL) ×2
ELECTRODE REM PT RTRN 9FT ADLT (ELECTROSURGICAL) ×1 IMPLANT
GEL ULTRASOUND 20GR AQUASONIC (MISCELLANEOUS) ×2 IMPLANT
GLOVE BIO SURGEON STRL SZ7.5 (GLOVE) ×2 IMPLANT
GOWN STRL REUS W/ TWL LRG LVL3 (GOWN DISPOSABLE) ×3 IMPLANT
GOWN STRL REUS W/TWL LRG LVL3 (GOWN DISPOSABLE) ×3
GRAFT GORETEX STRT 4-7X45 (Vascular Products) ×2 IMPLANT
HEMOSTAT SPONGE AVITENE ULTRA (HEMOSTASIS) ×2 IMPLANT
KIT BASIN OR (CUSTOM PROCEDURE TRAY) ×2 IMPLANT
KIT ROOM TURNOVER OR (KITS) ×2 IMPLANT
LIQUID BAND (GAUZE/BANDAGES/DRESSINGS) ×2 IMPLANT
LOOP VESSEL MINI RED (MISCELLANEOUS) ×2 IMPLANT
NS IRRIG 1000ML POUR BTL (IV SOLUTION) ×2 IMPLANT
PACK CV ACCESS (CUSTOM PROCEDURE TRAY) ×2 IMPLANT
PAD ARMBOARD 7.5X6 YLW CONV (MISCELLANEOUS) ×4 IMPLANT
SUT PROLENE 6 0 CC (SUTURE) ×6 IMPLANT
SUT SILK 0 (SUTURE) IMPLANT
SUT VIC AB 3-0 SH 27 (SUTURE) ×2
SUT VIC AB 3-0 SH 27X BRD (SUTURE) ×2 IMPLANT
SUT VICRYL 4-0 PS2 18IN ABS (SUTURE) ×4 IMPLANT
UNDERPAD 30X30 (UNDERPADS AND DIAPERS) ×2 IMPLANT
WATER STERILE IRR 1000ML POUR (IV SOLUTION) ×2 IMPLANT

## 2016-04-21 NOTE — H&P (View-Only) (Signed)
Referring Physician: Dr. Arty Baumgartner  Patient name: Destiny Day MRN: [id number] DOB: 1951-04-01 Sex: female  REASON FOR CONSULT: Poorly functioning right arm fistula  HPI: Destiny Day is a 65 y.o. female,  who previously had a left brachiocephalic AV fistula which never matured. She subsequently had a right basilic vein fistula placed in 2016. This did eventually mature but has required several recent symptoms percutaneous interventions. Patient also has significant pain when cannulating the fistula. She presents today for placement of a new hemodialysis access. She is currently dialyzing via a right-sided catheter. Other medical problems include anemia, arthritis, congestive failure, diabetes, hyperlipidemia, hypertension, all of which have been stable.  Past Medical History:  Diagnosis Date  . Abdominal bruit   . Anemia   . Anxiety   . Arthritis    knee  . Asthma   . CHF (congestive heart failure) ([place])   . Chronic kidney disease    stage 3  . Complication of anesthesia    " after I got home from my last procedure, I started itching."  . Diabetes mellitus    insulin dependent  . Diverticulitis   . GERD (gastroesophageal reflux disease)    from medications  . GI bleed 03/31/2013  . Hyperlipidemia   . Hypertension   . Osteoporosis   . Pneumonia   . Seasonal allergies   . Sleep apnea    can't afford cpap   Past Surgical History:  Procedure Laterality Date  . ABDOMINAL HYSTERECTOMY  1993`  . [place] TRANSPOSITION Left 07/10/2014   Procedure: BASCILIC VEIN TRANSPOSITION;  Surgeon: Angelia Mould, MD;  Location: [place];  Service: Vascular;  Laterality: Left;  . BASCILIC VEIN TRANSPOSITION Right 11/08/2014   Procedure: FIRST STAGE BASILIC VEIN TRANSPOSITION;  Surgeon: Angelia Mould, MD;  Location: [place];  Service: Vascular;  Laterality: Right;  . BASCILIC VEIN TRANSPOSITION Right 01/18/2015   Procedure: SECOND STAGE BASILIC VEIN TRANSPOSITION;   Surgeon: Angelia Mould, MD;  Location: [facility] OR;  Service: Vascular;  Laterality: Right;  . ESOPHAGOGASTRODUODENOSCOPY N/A 03/31/2013   Procedure: ESOPHAGOGASTRODUODENOSCOPY (EGD);  Surgeon: Gatha Mayer, MD;  Location: [facility] ENDOSCOPY;  Service: Endoscopy;  Laterality: N/A;  . EYE SURGERY     laser surgery  . FISTULOGRAM Left 10/29/2014   Procedure: FISTULOGRAM;  Surgeon: Angelia Mould, MD;  Location: [facility] CATH LAB;  Service: Cardiovascular;  Laterality: Left;  . PATCH ANGIOPLASTY Right 01/18/2015   Procedure: BASILIC VEIN PATCH ANGIOPLASTY USING VASCUGUARD PATCH;  Surgeon: Angelia Mould, MD;  Location: [facility] OR;  Service: Vascular;  Laterality: Right;    Family History  Problem Relation Age of Onset  . Colon cancer Neg Hx   . Other Mother     not sure of cause of death  . Diabetes Father   . Pancreatic cancer Maternal Grandmother     SOCIAL HISTORY: Social History   Social History  . Marital status: Married    Spouse name: [name]  . Number of children: 4  . Years of education: some collg   Occupational History  . Retired    Social History Main Topics  . Smoking status: Former Smoker    Packs/day: 0.35    Years: 40.00    Types: Cigarettes    Quit date: 05/10/2012  . Smokeless tobacco: Never Used  . Alcohol use No  . Drug use:      Comment: marijuana; quit in early 1980's  . Sexual activity: Yes   Other  Topics Concern  . Not on file   Social History Narrative   Patient lives at home with spouse.   Caffeine Use: 1 cup daily    Allergies  Allergen Reactions  . Lisinopril Cough  . Prednisone Other (See Comments)    Excessive fluid buildup    Current Outpatient Prescriptions  Medication Sig Dispense Refill  . albuterol (PROVENTIL HFA;VENTOLIN HFA) 108 (90 BASE) MCG/ACT inhaler Inhale 1-2 puffs into the lungs every 6 (six) hours as needed for wheezing or shortness of breath.    Marland Kitchen amLODipine (NORVASC) 2.5 MG tablet Take 1 tablet (2.5 mg total) by  mouth daily. 30 tablet 0  . aspirin 81 MG chewable tablet Chew 81 mg by mouth daily.    Marland Kitchen atorvastatin (LIPITOR) 20 MG tablet Take 20 mg by mouth daily at 6 PM.    . budesonide-formoterol (SYMBICORT) 160-4.5 MCG/ACT inhaler Inhale 1 puff into the lungs every evening.    . calcium acetate, Phos Binder, (PHOSLYRA) 667 MG/5ML SOLN Take by mouth 3 (three) times daily with meals.    . carvedilol (COREG) 12.5 MG tablet Take 2 tablets (25 mg total) by mouth 2 (two) times daily with a meal. 120 tablet 0  . HYDROcodone-homatropine (HYCODAN) 5-1.5 MG/5ML syrup Take 5 mLs by mouth every 6 (six) hours as needed for cough. 120 mL 0  . insulin aspart (NOVOLOG) 100 UNIT/ML injection Inject 0-8 Units into the skin 3 (three) times daily before meals. Sliding scale    . isosorbide-hydrALAZINE (BIDIL) 20-37.5 MG tablet Take 1 tablet by mouth 2 (two) times daily. 60 tablet 0  . LEVEMIR FLEXTOUCH 100 UNIT/ML Pen Inject 10 Units into the muscle at bedtime.     Marland Kitchen loratadine (CLARITIN) 10 MG tablet Take 10 mg by mouth daily as needed for allergies.    . multivitamin (RENA-VIT) TABS tablet Take 1 tablet by mouth daily.  5   No current facility-administered medications for this visit.     ROS:   General:  No weight loss, Fever, chills  HEENT: No recent headaches, no nasal bleeding, no visual changes, no sore throat  Neurologic: No dizziness, blackouts, seizures. No recent symptoms of stroke or mini- stroke. No recent episodes of slurred speech, or temporary blindness.  Cardiac: No recent episodes of chest pain/pressure, no shortness of breath at rest.  No shortness of breath with exertion.  Denies history of atrial fibrillation or irregular heartbeat  Vascular: No history of rest pain in feet.  No history of claudication.  No history of non-healing ulcer, No history of DVT   Pulmonary: No home oxygen, no productive cough, no hemoptysis,  No asthma or wheezing  Musculoskeletal:  [ ]  Arthritis, [ ]  Low back pain,   [X]  Joint pain  Hematologic:No history of hypercoagulable state.  No history of easy bleeding.  No history of anemia  Gastrointestinal: No hematochezia or melena,  No gastroesophageal reflux, no trouble swallowing  Urinary: [X]  chronic Kidney disease, [X]  on HD - [X]  MWF or [ ]  TTHS, [ ]  Burning with urination, [ ]  Frequent urination, [ ]  Difficulty urinating;   Skin: No rashes  Psychological: No history of anxiety,  No history of depression   Physical Examination  Vitals:   04/09/16 1028 04/09/16 1029  BP: (!) 191/79 (!) 181/78  Pulse: 83   Resp: 16   Temp: 97.8 F (36.6 C)   TempSrc: Oral   SpO2: 100%   Weight: 143 lb (64.9 kg)   Height: 5\' 4"  (1.626 m)  Body mass index is 24.55 kg/m.  General:  Alert and oriented, no acute distress HEENT: Normal Neck: No bruit or JVD Pulmonary: Clear to auscultation bilaterally Cardiac: Regular Rate and Rhythm without murmur Abdomen: Soft, non-tender, non-distended, no mass Skin: No rash Extremity Pulses:  2+ radial, brachial, Pulsatile fistula left upper arm to small to cannulate, palpable thrill in fistula right upper extremity audible bruit fistula is palpable beneath the scar Musculoskeletal: No deformity or edema  Neurologic: Upper and lower extremity motor 5/5 and symmetric  ASSESSMENT:  Patient needs long-term hemodialysis access.   PLAN:  We will place a left arm AV graft. I will also ligate her left brachiocephalic fistula at the same time to reduce risk of steal. These are catheter for now. But if her fistula in the right arm functions reasonably this could be used as an alternative access until the graft matures in her arm. Risks benefits possible, patient's procedure details were trying to the patient today including not limited to graft thrombosis risk 25% per year bleeding infection ischemic steal. She understands and wishes to proceed. This is scheduled for 04/21/2016.   Ruta Hinds, MD [facility] [place] Office: [phone] Pager: [phone]

## 2016-04-21 NOTE — Anesthesia Procedure Notes (Signed)
Procedure Name: LMA Insertion Date/Time: 04/21/2016 9:42 AM Performed by: Salli Quarry Erielle Gawronski Pre-anesthesia Checklist: Patient identified, Emergency Drugs available, Suction available and Patient being monitored Patient Re-evaluated:Patient Re-evaluated prior to inductionOxygen Delivery Method: Circle System Utilized Preoxygenation: Pre-oxygenation with 100% oxygen Intubation Type: IV induction Ventilation: Mask ventilation without difficulty LMA: LMA inserted LMA Size: 4.0 Number of attempts: 1 Airway Equipment and Method: Bite block Placement Confirmation: positive ETCO2 Tube secured with: Tape Dental Injury: Teeth and Oropharynx as per pre-operative assessment

## 2016-04-21 NOTE — Anesthesia Postprocedure Evaluation (Signed)
Anesthesia Post Note  Patient: Destiny Day  Procedure(s) Performed: Procedure(s) (LRB): LIGATION OF ARTERIOVENOUS  FISTULA LEFT ARM (Left) INSERTION OF ARTERIOVENOUS (AV) GORE-TEX GRAFT ARM LEFT (Left)  Patient location during evaluation: PACU Anesthesia Type: General Level of consciousness: awake, awake and alert and oriented Pain management: pain level controlled Vital Signs Assessment: post-procedure vital signs reviewed and stable Respiratory status: spontaneous breathing, nonlabored ventilation and respiratory function stable Cardiovascular status: blood pressure returned to baseline Anesthetic complications: no    Last Vitals:  Vitals:   04/21/16 1230 04/21/16 1250  BP: (!) 97/52 132/67  Pulse: 73   Resp: 12 16  Temp: 36.7 C     Last Pain:  Vitals:   04/21/16 1250  TempSrc:   PainSc: 2                  Veona Bittman COKER

## 2016-04-21 NOTE — Anesthesia Preprocedure Evaluation (Signed)
Anesthesia Evaluation  Patient identified by MRN, date of birth, ID band Patient awake    Reviewed: Allergy & Precautions, NPO status , Patient's Chart, lab work & pertinent test results  Airway Mallampati: II  TM Distance: >3 FB Neck ROM: Full    Dental  (+) Edentulous Upper, Dental Advisory Given   Pulmonary former smoker,    breath sounds clear to auscultation       Cardiovascular hypertension,  Rhythm:Regular Rate:Normal     Neuro/Psych    GI/Hepatic   Endo/Other  diabetes  Renal/GU      Musculoskeletal   Abdominal   Peds  Hematology   Anesthesia Other Findings   Reproductive/Obstetrics                             Anesthesia Physical Anesthesia Plan  ASA: III  Anesthesia Plan: General   Post-op Pain Management:    Induction: Intravenous  Airway Management Planned: LMA  Additional Equipment:   Intra-op Plan:   Post-operative Plan: Extubation in OR  Informed Consent: I have reviewed the patients History and Physical, chart, labs and discussed the procedure including the risks, benefits and alternatives for the proposed anesthesia with the patient or authorized representative who has indicated his/her understanding and acceptance.   Dental advisory given  Plan Discussed with: CRNA and Anesthesiologist  Anesthesia Plan Comments:         Anesthesia Quick Evaluation

## 2016-04-21 NOTE — Op Note (Signed)
Procedure: Left Upper Arm AV graft, ligation left upper arm fistula  Preop: ESRD  Postop: ESRD  Anesthesia: General  Findings:4-7 mm PTFE end to side to axillary vein   Procedure Details: The left upper extremity was prepped and draped in usual sterile fashion.  A longitudinal incision was then made near the antecubital crease the left arm.  There were no suitable antecubital veins for outflow.   The incision was carried into the subcutaneous tissues down to level of the brachial artery. There was a pre existing brachial artery based fistula and this was dissected free circumferentially.  Next the brachial artery was dissected free in the medial portion incision. The artery was  3 mm in diameter. The vessel loops were placed proximal and distal to the planned site of arteriotomy. At this point, a longitudinal incision was made in the axilla and carried through the subcutaneous tissues and fascia to expose the axillary vein.  The nerves were protected.  The vein was approximately 7 mm in diameter. Next, a subcutaneous tunnel was created connecting the upper arm to the lower arm incision in an arcing configuration over the biceps muscle.  A 4-7 mm PTFE graft was then brought through this subcutaneous tunnel. The patient was given 5000 units of intravenous heparin. After appropriate circulation time, the vessel loops were used to control the artery. The fistula was ligated and the old anastomosis taken down utilizing the old arteriotomy.  The 4 mm end of the graft was beveled and sewn end to side to the artery using a 6 0 prolene.  At completion of the anastomosis the artery was forward bled, backbled and thoroughly flushed.  The anastomosis was secured, vessel loops were released and there was palpable pulse in the graft.  The graft was clamped just above the arterial anastomosis with a fistula clamp. The graft was then pulled taut to length at the axillary incision.  The axillary vein was controlled with  a fine bulldog clamp proximally and distally in the upper axilla.  The vein was opened longitudinally.  The distal end of the graft was then beveled and sewn end to side to the vein using a running 6 0 prolene.  Just prior to completion of the anastomosis, everything was forward bled, back bled and thoroughly flushed.  The anastomosis was secured and the fistula clamp removed from the proximal graft.  A thrill was immediately palpable in the graft. The patient was given 50 mg of protamine to assist with hemostasis.  After hemostasis was obtained, the subcutaneous tissues were reapproximated using a running 3-0 Vicryl suture. The skin was then closed with a 4 0 Vicryl subcuticular stitch. Dermabond was applied to the skin incisions.  The patient tolerated the procedure well and there were no complications.  Instrument sponge and needle count was correct at the end of the case.  The patient was taken to the recovery room in stable condition. The patient had audible radial and ulnar doppler signals at the end of the case this augmented about 50% with compression of the graft.  Destiny Hinds, MD [facility] [employer] [facility] Office: [phone] Pager: [phone]

## 2016-04-21 NOTE — Transfer of Care (Signed)
Immediate Anesthesia Transfer of Care Note  Patient: Destiny Day  Procedure(s) Performed: Procedure(s): LIGATION OF ARTERIOVENOUS  FISTULA LEFT ARM (Left) INSERTION OF ARTERIOVENOUS (AV) GORE-TEX GRAFT ARM LEFT (Left)  Patient Location: PACU  Anesthesia Type:General  Level of Consciousness: awake, patient cooperative and responds to stimulation  Airway & Oxygen Therapy: Patient Spontanous Breathing and Patient connected to nasal cannula oxygen  Post-op Assessment: Report given to RN and Post -op Vital signs reviewed and stable  Post vital signs: Reviewed and stable  Last Vitals:  Vitals:   04/21/16 0801 04/21/16 1127  BP: (!) 146/65 (P) 138/66  Pulse: 75   Resp: 16 (P) 15  Temp: 36.5 C (P) 36.6 C    Last Pain:  Vitals:   04/21/16 0801  TempSrc: Oral      Patients Stated Pain Goal: 3 (48/01/65 5374)  Complications: No apparent anesthesia complications

## 2016-04-21 NOTE — Interval H&P Note (Signed)
History and Physical Interval Note:  04/21/2016 9:06 AM  Destiny Day  has presented today for surgery, with the diagnosis of Chronic kidney disease  The various methods of treatment have been discussed with the patient and family. After consideration of risks, benefits and other options for treatment, the patient has consented to  Procedure(s): LIGATION OF ARTERIOVENOUS  FISTULA LEFT ARM (Left) INSERTION OF ARTERIOVENOUS (AV) GORE-TEX GRAFT ARM (Left) as a surgical intervention .  The patient's history has been reviewed, patient examined, no change in status, stable for surgery.  I have reviewed the patient's chart and labs.  Questions were answered to the patient's satisfaction.     Ruta Hinds

## 2016-04-22 ENCOUNTER — Encounter (HOSPITAL_COMMUNITY): Payer: Self-pay | Admitting: Vascular Surgery

## 2016-04-22 DIAGNOSIS — N2581 Secondary hyperparathyroidism of renal origin: Secondary | ICD-10-CM | POA: Diagnosis not present

## 2016-04-22 DIAGNOSIS — Z23 Encounter for immunization: Secondary | ICD-10-CM | POA: Diagnosis not present

## 2016-04-22 DIAGNOSIS — N186 End stage renal disease: Secondary | ICD-10-CM | POA: Diagnosis not present

## 2016-04-22 DIAGNOSIS — D509 Iron deficiency anemia, unspecified: Secondary | ICD-10-CM | POA: Diagnosis not present

## 2016-04-22 DIAGNOSIS — E1122 Type 2 diabetes mellitus with diabetic chronic kidney disease: Secondary | ICD-10-CM | POA: Diagnosis not present

## 2016-04-24 DIAGNOSIS — N186 End stage renal disease: Secondary | ICD-10-CM | POA: Diagnosis not present

## 2016-04-24 DIAGNOSIS — D509 Iron deficiency anemia, unspecified: Secondary | ICD-10-CM | POA: Diagnosis not present

## 2016-04-24 DIAGNOSIS — N2581 Secondary hyperparathyroidism of renal origin: Secondary | ICD-10-CM | POA: Diagnosis not present

## 2016-04-24 DIAGNOSIS — E1122 Type 2 diabetes mellitus with diabetic chronic kidney disease: Secondary | ICD-10-CM | POA: Diagnosis not present

## 2016-04-24 DIAGNOSIS — Z23 Encounter for immunization: Secondary | ICD-10-CM | POA: Diagnosis not present

## 2016-04-25 DIAGNOSIS — N186 End stage renal disease: Secondary | ICD-10-CM | POA: Diagnosis not present

## 2016-04-25 DIAGNOSIS — I159 Secondary hypertension, unspecified: Secondary | ICD-10-CM | POA: Diagnosis not present

## 2016-04-25 DIAGNOSIS — Z992 Dependence on renal dialysis: Secondary | ICD-10-CM | POA: Diagnosis not present

## 2016-04-27 DIAGNOSIS — D509 Iron deficiency anemia, unspecified: Secondary | ICD-10-CM | POA: Diagnosis not present

## 2016-04-27 DIAGNOSIS — N186 End stage renal disease: Secondary | ICD-10-CM | POA: Diagnosis not present

## 2016-04-27 DIAGNOSIS — N2581 Secondary hyperparathyroidism of renal origin: Secondary | ICD-10-CM | POA: Diagnosis not present

## 2016-04-27 DIAGNOSIS — D689 Coagulation defect, unspecified: Secondary | ICD-10-CM | POA: Diagnosis not present

## 2016-04-27 DIAGNOSIS — D631 Anemia in chronic kidney disease: Secondary | ICD-10-CM | POA: Diagnosis not present

## 2016-04-29 DIAGNOSIS — D631 Anemia in chronic kidney disease: Secondary | ICD-10-CM | POA: Diagnosis not present

## 2016-04-29 DIAGNOSIS — D509 Iron deficiency anemia, unspecified: Secondary | ICD-10-CM | POA: Diagnosis not present

## 2016-04-29 DIAGNOSIS — N2581 Secondary hyperparathyroidism of renal origin: Secondary | ICD-10-CM | POA: Diagnosis not present

## 2016-04-29 DIAGNOSIS — D689 Coagulation defect, unspecified: Secondary | ICD-10-CM | POA: Diagnosis not present

## 2016-04-29 DIAGNOSIS — N186 End stage renal disease: Secondary | ICD-10-CM | POA: Diagnosis not present

## 2016-05-01 DIAGNOSIS — D631 Anemia in chronic kidney disease: Secondary | ICD-10-CM | POA: Diagnosis not present

## 2016-05-01 DIAGNOSIS — D509 Iron deficiency anemia, unspecified: Secondary | ICD-10-CM | POA: Diagnosis not present

## 2016-05-01 DIAGNOSIS — D689 Coagulation defect, unspecified: Secondary | ICD-10-CM | POA: Diagnosis not present

## 2016-05-01 DIAGNOSIS — N2581 Secondary hyperparathyroidism of renal origin: Secondary | ICD-10-CM | POA: Diagnosis not present

## 2016-05-01 DIAGNOSIS — N186 End stage renal disease: Secondary | ICD-10-CM | POA: Diagnosis not present

## 2016-05-04 DIAGNOSIS — D631 Anemia in chronic kidney disease: Secondary | ICD-10-CM | POA: Diagnosis not present

## 2016-05-04 DIAGNOSIS — N2581 Secondary hyperparathyroidism of renal origin: Secondary | ICD-10-CM | POA: Diagnosis not present

## 2016-05-04 DIAGNOSIS — D689 Coagulation defect, unspecified: Secondary | ICD-10-CM | POA: Diagnosis not present

## 2016-05-04 DIAGNOSIS — D509 Iron deficiency anemia, unspecified: Secondary | ICD-10-CM | POA: Diagnosis not present

## 2016-05-04 DIAGNOSIS — N186 End stage renal disease: Secondary | ICD-10-CM | POA: Diagnosis not present

## 2016-05-06 DIAGNOSIS — D689 Coagulation defect, unspecified: Secondary | ICD-10-CM | POA: Diagnosis not present

## 2016-05-06 DIAGNOSIS — D509 Iron deficiency anemia, unspecified: Secondary | ICD-10-CM | POA: Diagnosis not present

## 2016-05-06 DIAGNOSIS — N186 End stage renal disease: Secondary | ICD-10-CM | POA: Diagnosis not present

## 2016-05-06 DIAGNOSIS — N2581 Secondary hyperparathyroidism of renal origin: Secondary | ICD-10-CM | POA: Diagnosis not present

## 2016-05-06 DIAGNOSIS — D631 Anemia in chronic kidney disease: Secondary | ICD-10-CM | POA: Diagnosis not present

## 2016-05-08 DIAGNOSIS — N186 End stage renal disease: Secondary | ICD-10-CM | POA: Diagnosis not present

## 2016-05-08 DIAGNOSIS — N2581 Secondary hyperparathyroidism of renal origin: Secondary | ICD-10-CM | POA: Diagnosis not present

## 2016-05-08 DIAGNOSIS — D689 Coagulation defect, unspecified: Secondary | ICD-10-CM | POA: Diagnosis not present

## 2016-05-08 DIAGNOSIS — D631 Anemia in chronic kidney disease: Secondary | ICD-10-CM | POA: Diagnosis not present

## 2016-05-08 DIAGNOSIS — D509 Iron deficiency anemia, unspecified: Secondary | ICD-10-CM | POA: Diagnosis not present

## 2016-05-11 DIAGNOSIS — N2581 Secondary hyperparathyroidism of renal origin: Secondary | ICD-10-CM | POA: Diagnosis not present

## 2016-05-11 DIAGNOSIS — D509 Iron deficiency anemia, unspecified: Secondary | ICD-10-CM | POA: Diagnosis not present

## 2016-05-11 DIAGNOSIS — D689 Coagulation defect, unspecified: Secondary | ICD-10-CM | POA: Diagnosis not present

## 2016-05-11 DIAGNOSIS — D631 Anemia in chronic kidney disease: Secondary | ICD-10-CM | POA: Diagnosis not present

## 2016-05-11 DIAGNOSIS — N186 End stage renal disease: Secondary | ICD-10-CM | POA: Diagnosis not present

## 2016-05-13 DIAGNOSIS — N186 End stage renal disease: Secondary | ICD-10-CM | POA: Diagnosis not present

## 2016-05-13 DIAGNOSIS — D689 Coagulation defect, unspecified: Secondary | ICD-10-CM | POA: Diagnosis not present

## 2016-05-13 DIAGNOSIS — D509 Iron deficiency anemia, unspecified: Secondary | ICD-10-CM | POA: Diagnosis not present

## 2016-05-13 DIAGNOSIS — N2581 Secondary hyperparathyroidism of renal origin: Secondary | ICD-10-CM | POA: Diagnosis not present

## 2016-05-13 DIAGNOSIS — D631 Anemia in chronic kidney disease: Secondary | ICD-10-CM | POA: Diagnosis not present

## 2016-05-15 DIAGNOSIS — D631 Anemia in chronic kidney disease: Secondary | ICD-10-CM | POA: Diagnosis not present

## 2016-05-15 DIAGNOSIS — D689 Coagulation defect, unspecified: Secondary | ICD-10-CM | POA: Diagnosis not present

## 2016-05-15 DIAGNOSIS — N2581 Secondary hyperparathyroidism of renal origin: Secondary | ICD-10-CM | POA: Diagnosis not present

## 2016-05-15 DIAGNOSIS — N186 End stage renal disease: Secondary | ICD-10-CM | POA: Diagnosis not present

## 2016-05-15 DIAGNOSIS — D509 Iron deficiency anemia, unspecified: Secondary | ICD-10-CM | POA: Diagnosis not present

## 2016-05-18 DIAGNOSIS — N186 End stage renal disease: Secondary | ICD-10-CM | POA: Diagnosis not present

## 2016-05-18 DIAGNOSIS — D509 Iron deficiency anemia, unspecified: Secondary | ICD-10-CM | POA: Diagnosis not present

## 2016-05-18 DIAGNOSIS — N2581 Secondary hyperparathyroidism of renal origin: Secondary | ICD-10-CM | POA: Diagnosis not present

## 2016-05-18 DIAGNOSIS — D631 Anemia in chronic kidney disease: Secondary | ICD-10-CM | POA: Diagnosis not present

## 2016-05-18 DIAGNOSIS — D689 Coagulation defect, unspecified: Secondary | ICD-10-CM | POA: Diagnosis not present

## 2016-05-20 DIAGNOSIS — N2581 Secondary hyperparathyroidism of renal origin: Secondary | ICD-10-CM | POA: Diagnosis not present

## 2016-05-20 DIAGNOSIS — E1122 Type 2 diabetes mellitus with diabetic chronic kidney disease: Secondary | ICD-10-CM | POA: Diagnosis not present

## 2016-05-20 DIAGNOSIS — N186 End stage renal disease: Secondary | ICD-10-CM | POA: Diagnosis not present

## 2016-05-20 DIAGNOSIS — D509 Iron deficiency anemia, unspecified: Secondary | ICD-10-CM | POA: Diagnosis not present

## 2016-05-20 DIAGNOSIS — D689 Coagulation defect, unspecified: Secondary | ICD-10-CM | POA: Diagnosis not present

## 2016-05-20 DIAGNOSIS — D631 Anemia in chronic kidney disease: Secondary | ICD-10-CM | POA: Diagnosis not present

## 2016-05-22 DIAGNOSIS — D689 Coagulation defect, unspecified: Secondary | ICD-10-CM | POA: Diagnosis not present

## 2016-05-22 DIAGNOSIS — D509 Iron deficiency anemia, unspecified: Secondary | ICD-10-CM | POA: Diagnosis not present

## 2016-05-22 DIAGNOSIS — D631 Anemia in chronic kidney disease: Secondary | ICD-10-CM | POA: Diagnosis not present

## 2016-05-22 DIAGNOSIS — N186 End stage renal disease: Secondary | ICD-10-CM | POA: Diagnosis not present

## 2016-05-22 DIAGNOSIS — N2581 Secondary hyperparathyroidism of renal origin: Secondary | ICD-10-CM | POA: Diagnosis not present

## 2016-05-25 DIAGNOSIS — N186 End stage renal disease: Secondary | ICD-10-CM | POA: Diagnosis not present

## 2016-05-25 DIAGNOSIS — N2581 Secondary hyperparathyroidism of renal origin: Secondary | ICD-10-CM | POA: Diagnosis not present

## 2016-05-25 DIAGNOSIS — D509 Iron deficiency anemia, unspecified: Secondary | ICD-10-CM | POA: Diagnosis not present

## 2016-05-25 DIAGNOSIS — D631 Anemia in chronic kidney disease: Secondary | ICD-10-CM | POA: Diagnosis not present

## 2016-05-25 DIAGNOSIS — D689 Coagulation defect, unspecified: Secondary | ICD-10-CM | POA: Diagnosis not present

## 2016-05-26 DIAGNOSIS — Z992 Dependence on renal dialysis: Secondary | ICD-10-CM | POA: Diagnosis not present

## 2016-05-26 DIAGNOSIS — N186 End stage renal disease: Secondary | ICD-10-CM | POA: Diagnosis not present

## 2016-05-26 DIAGNOSIS — I159 Secondary hypertension, unspecified: Secondary | ICD-10-CM | POA: Diagnosis not present

## 2016-05-27 DIAGNOSIS — E1122 Type 2 diabetes mellitus with diabetic chronic kidney disease: Secondary | ICD-10-CM | POA: Diagnosis not present

## 2016-05-27 DIAGNOSIS — N2581 Secondary hyperparathyroidism of renal origin: Secondary | ICD-10-CM | POA: Diagnosis not present

## 2016-05-27 DIAGNOSIS — D631 Anemia in chronic kidney disease: Secondary | ICD-10-CM | POA: Diagnosis not present

## 2016-05-27 DIAGNOSIS — D509 Iron deficiency anemia, unspecified: Secondary | ICD-10-CM | POA: Diagnosis not present

## 2016-05-27 DIAGNOSIS — N186 End stage renal disease: Secondary | ICD-10-CM | POA: Diagnosis not present

## 2016-05-29 DIAGNOSIS — D631 Anemia in chronic kidney disease: Secondary | ICD-10-CM | POA: Diagnosis not present

## 2016-05-29 DIAGNOSIS — D509 Iron deficiency anemia, unspecified: Secondary | ICD-10-CM | POA: Diagnosis not present

## 2016-05-29 DIAGNOSIS — N2581 Secondary hyperparathyroidism of renal origin: Secondary | ICD-10-CM | POA: Diagnosis not present

## 2016-05-29 DIAGNOSIS — N186 End stage renal disease: Secondary | ICD-10-CM | POA: Diagnosis not present

## 2016-05-29 DIAGNOSIS — E1122 Type 2 diabetes mellitus with diabetic chronic kidney disease: Secondary | ICD-10-CM | POA: Diagnosis not present

## 2016-06-01 DIAGNOSIS — N2581 Secondary hyperparathyroidism of renal origin: Secondary | ICD-10-CM | POA: Diagnosis not present

## 2016-06-01 DIAGNOSIS — D631 Anemia in chronic kidney disease: Secondary | ICD-10-CM | POA: Diagnosis not present

## 2016-06-01 DIAGNOSIS — E1122 Type 2 diabetes mellitus with diabetic chronic kidney disease: Secondary | ICD-10-CM | POA: Diagnosis not present

## 2016-06-01 DIAGNOSIS — D509 Iron deficiency anemia, unspecified: Secondary | ICD-10-CM | POA: Diagnosis not present

## 2016-06-01 DIAGNOSIS — N186 End stage renal disease: Secondary | ICD-10-CM | POA: Diagnosis not present

## 2016-06-03 DIAGNOSIS — D509 Iron deficiency anemia, unspecified: Secondary | ICD-10-CM | POA: Diagnosis not present

## 2016-06-03 DIAGNOSIS — D631 Anemia in chronic kidney disease: Secondary | ICD-10-CM | POA: Diagnosis not present

## 2016-06-03 DIAGNOSIS — E1122 Type 2 diabetes mellitus with diabetic chronic kidney disease: Secondary | ICD-10-CM | POA: Diagnosis not present

## 2016-06-03 DIAGNOSIS — N2581 Secondary hyperparathyroidism of renal origin: Secondary | ICD-10-CM | POA: Diagnosis not present

## 2016-06-03 DIAGNOSIS — N186 End stage renal disease: Secondary | ICD-10-CM | POA: Diagnosis not present

## 2016-06-05 DIAGNOSIS — D631 Anemia in chronic kidney disease: Secondary | ICD-10-CM | POA: Diagnosis not present

## 2016-06-05 DIAGNOSIS — N186 End stage renal disease: Secondary | ICD-10-CM | POA: Diagnosis not present

## 2016-06-05 DIAGNOSIS — N2581 Secondary hyperparathyroidism of renal origin: Secondary | ICD-10-CM | POA: Diagnosis not present

## 2016-06-05 DIAGNOSIS — D509 Iron deficiency anemia, unspecified: Secondary | ICD-10-CM | POA: Diagnosis not present

## 2016-06-05 DIAGNOSIS — E1122 Type 2 diabetes mellitus with diabetic chronic kidney disease: Secondary | ICD-10-CM | POA: Diagnosis not present

## 2016-06-08 ENCOUNTER — Other Ambulatory Visit (HOSPITAL_COMMUNITY): Payer: Self-pay | Admitting: Internal Medicine

## 2016-06-08 ENCOUNTER — Ambulatory Visit (HOSPITAL_COMMUNITY)
Admission: RE | Admit: 2016-06-08 | Discharge: 2016-06-08 | Disposition: A | Discharge status: Home / Self Care | Payer: Medicare Other | Source: Ambulatory Visit | Attending: Vascular Surgery | Admitting: Vascular Surgery

## 2016-06-08 DIAGNOSIS — D509 Iron deficiency anemia, unspecified: Secondary | ICD-10-CM | POA: Diagnosis not present

## 2016-06-08 DIAGNOSIS — E785 Hyperlipidemia, unspecified: Secondary | ICD-10-CM | POA: Insufficient documentation

## 2016-06-08 DIAGNOSIS — E08621 Diabetes mellitus due to underlying condition with foot ulcer: Secondary | ICD-10-CM | POA: Diagnosis not present

## 2016-06-08 DIAGNOSIS — D631 Anemia in chronic kidney disease: Secondary | ICD-10-CM | POA: Diagnosis not present

## 2016-06-08 DIAGNOSIS — I10 Essential (primary) hypertension: Secondary | ICD-10-CM | POA: Diagnosis not present

## 2016-06-08 DIAGNOSIS — L97524 Non-pressure chronic ulcer of other part of left foot with necrosis of bone: Principal | ICD-10-CM

## 2016-06-08 DIAGNOSIS — R938 Abnormal findings on diagnostic imaging of other specified body structures: Secondary | ICD-10-CM | POA: Diagnosis not present

## 2016-06-08 DIAGNOSIS — N2581 Secondary hyperparathyroidism of renal origin: Secondary | ICD-10-CM | POA: Diagnosis not present

## 2016-06-08 DIAGNOSIS — E11621 Type 2 diabetes mellitus with foot ulcer: Secondary | ICD-10-CM | POA: Insufficient documentation

## 2016-06-08 DIAGNOSIS — N186 End stage renal disease: Secondary | ICD-10-CM | POA: Diagnosis not present

## 2016-06-08 DIAGNOSIS — E1151 Type 2 diabetes mellitus with diabetic peripheral angiopathy without gangrene: Secondary | ICD-10-CM | POA: Insufficient documentation

## 2016-06-08 DIAGNOSIS — Z87891 Personal history of nicotine dependence: Secondary | ICD-10-CM | POA: Diagnosis not present

## 2016-06-08 DIAGNOSIS — E1122 Type 2 diabetes mellitus with diabetic chronic kidney disease: Secondary | ICD-10-CM | POA: Diagnosis not present

## 2016-06-09 DIAGNOSIS — Z452 Encounter for adjustment and management of vascular access device: Secondary | ICD-10-CM | POA: Diagnosis not present

## 2016-06-10 DIAGNOSIS — N2581 Secondary hyperparathyroidism of renal origin: Secondary | ICD-10-CM | POA: Diagnosis not present

## 2016-06-10 DIAGNOSIS — D631 Anemia in chronic kidney disease: Secondary | ICD-10-CM | POA: Diagnosis not present

## 2016-06-10 DIAGNOSIS — N186 End stage renal disease: Secondary | ICD-10-CM | POA: Diagnosis not present

## 2016-06-10 DIAGNOSIS — E1122 Type 2 diabetes mellitus with diabetic chronic kidney disease: Secondary | ICD-10-CM | POA: Diagnosis not present

## 2016-06-10 DIAGNOSIS — D509 Iron deficiency anemia, unspecified: Secondary | ICD-10-CM | POA: Diagnosis not present

## 2016-06-11 ENCOUNTER — Ambulatory Visit (INDEPENDENT_AMBULATORY_CARE_PROVIDER_SITE_OTHER): Payer: Medicare Other | Admitting: Orthopedic Surgery

## 2016-06-11 ENCOUNTER — Ambulatory Visit (INDEPENDENT_AMBULATORY_CARE_PROVIDER_SITE_OTHER): Payer: Medicare Other

## 2016-06-11 VITALS — Ht 64.0 in | Wt 143.0 lb

## 2016-06-11 DIAGNOSIS — I96 Gangrene, not elsewhere classified: Secondary | ICD-10-CM | POA: Diagnosis not present

## 2016-06-11 DIAGNOSIS — M79672 Pain in left foot: Secondary | ICD-10-CM

## 2016-06-11 MED ORDER — MUPIROCIN 2 % EX OINT: 1.0000 "application " | TOPICAL_OINTMENT | Freq: Two times a day (BID) | CUTANEOUS | 6 refills | Status: DC

## 2016-06-11 NOTE — Addendum Note (Signed)
Addended by: Meridee Score on: 06/11/2016 05:16 PM   Modules accepted: Orders

## 2016-06-11 NOTE — Progress Notes (Signed)
Office Visit Note   Patient: Destiny Day           Date of Birth: [birth year]           MRN: [id number] Visit Date: 06/11/2016              Requested by: Velna Hatchet, MD [place], [place] [id number] PCP: Velna Hatchet, MD   Assessment & Plan: Visit Diagnoses:  1. Gangrene of toe of left foot (HCC)   2. Pain in left foot     Plan: Ulcer debridement left great toe of skin and soft tissue. Patient has exposed bone with necrotic tissue she does not have a palpable pulse. Ankle-brachial indices shows severe peripheral vascular disease. A consult was made for her to follow-up with vascular vein surgery to see if she is a candidate for revascularization to the left lower extremity she will need at least amputation of the left great toe possible transmetatarsal amputation possible higher level amputation if she is not a candidate for revascularization.  Follow-Up Instructions: Return in about 3 weeks (around 07/02/2016).   Orders:  Orders Placed This Encounter  Procedures  . XR Foot Complete Left  . Ambulatory referral to Vascular Surgery   No orders of the defined types were placed in this encounter.     Procedures: No procedures performed   Clinical Data: No additional findings.   Subjective: Chief Complaint  Patient presents with  . Left Foot - Pain, Wound Check    Tip of great toe ulcer     Patient is here for a consult for her left great toe ulcer. This developed the beginning of November. She is a diabetic dialysis patient. She was referred to vein and vascular this Monday and states that she had a doppler to check blood flow but does not yet know the results. This is a dry gangrene at the tip of her toe. Foot is dusky and discolored and painful. There is swelling. No dressing applied and pt states that she is not taking any antibiotics.     Wound Check     Review of Systems   Objective: Vital Signs: Ht 5\' 4"  (1.626 m)   Wt 143 lb (64.9  kg)   BMI 24.55 kg/m   Physical Exam on examination patient is alert oriented no adenopathy well-dressed normal affect normal respiratory effort she has an antalgic gait. She does not have a palpable pulse ankle-brachial indices were reviewed which showed severe peripheral vessel disease the left lower extremity. She has dry gangrenous changes to the second toe and great toe after informed consent a 10 blade knife was used to debride the necrotic tissue right toe there is no abscess there is exposed bone. Ulcers approximately 15 mm in diameter 3 mm deep of the great toe with dry gangrenous changes.  Ortho Exam  Specialty Comments:  No specialty comments available.  Imaging: Xr Foot Complete Left  Result Date: 06/11/2016 Radiographs show severe peripheral vascular disease with calcification of her digital vessels. She has destructive changes of the tuft of the great toe consistent with osteomyelitis.    PMFS History: Patient Active Problem List   Diagnosis Date Noted  . Acute pulmonary edema ([place]) 10/07/2015  . GERD (gastroesophageal reflux disease) 10/07/2015  . ARF (acute renal failure) ([place])   . Hypothermia 06/12/2015  . Acute on chronic renal failure ([place]) 06/12/2015  . CHF (congestive heart failure) ([place]) 07/30/2014  . CKD (chronic kidney disease) stage 4, GFR 15-29  ml/min ([place]) 06/27/2014  . Acute exacerbation of CHF (congestive heart failure) ([place]) 01/10/2014  . Hypertensive heart disease 05/25/2013  . CKD (chronic kidney disease) stage 3, GFR 30-59 ml/min 05/25/2013  . Malignant hypertension 05/23/2013  . Acute respiratory failure ([place]) 05/23/2013  . Hypertensive urgency 05/23/2013  . Pulmonary edema 05/23/2013  . Type 2 diabetes mellitus with diabetic nephropathy ([place]) 05/23/2013  . Type 2 diabetes mellitus with hyperosmolar nonketotic hyperglycemia ([place]) 04/13/2013  . Chronic diastolic CHF (congestive heart failure) ([place]) 04/13/2013  . UGI bleed 03/31/2013  .  Hypokalemia 08/24/2012  . Diastolic CHF, acute on chronic ([place]) 08/22/2011  . HTN (hypertension), malignant 08/22/2011  . Type II or unspecified type diabetes mellitus without mention of complication, not stated as uncontrolled 12/27/2007  . HLD (hyperlipidemia) 12/27/2007  . Essential hypertension 12/27/2007  . ALLERGIC RHINITIS 12/27/2007  . Asthma 12/27/2007   Past Medical History:  Diagnosis Date  . Abdominal bruit   . Anemia   . Anxiety   . Arthritis    Osteoarthritis  . Asthma   . Cervical disc disease    "pinced nerve"  . CHF (congestive heart failure) ([place])   . Complication of anesthesia    " after I got home from my last procedure, I started itching."  . Diabetes mellitus    insulin dependent  . Diverticulitis   . ESRD on peritoneal dialysis [facility])    Hemodialysis - MWF  . GERD (gastroesophageal reflux disease)    from medications  . GI bleed 03/31/2013  . Hyperlipidemia   . Hypertension   . Osteoporosis   . Pneumonia   . Seasonal allergies   . Shortness of breath dyspnea    WIth exertion  . Sleep apnea    can't afford cpap    Family History  Problem Relation Age of Onset  . Other Mother     not sure of cause of death  . Diabetes Father   . Pancreatic cancer Maternal Grandmother   . Colon cancer Neg Hx     Past Surgical History:  Procedure Laterality Date  . ABDOMINAL HYSTERECTOMY  1993`  . AV FISTULA PLACEMENT Left 04/21/2016   Procedure: INSERTION OF ARTERIOVENOUS (AV) GORE-TEX GRAFT ARM LEFT;  Surgeon: Elam Dutch, MD;  Location: [place];  Service: Vascular;  Laterality: Left;  . [place] TRANSPOSITION Left 07/10/2014   Procedure: BASCILIC VEIN TRANSPOSITION;  Surgeon: Angelia Mould, MD;  Location: [place];  Service: Vascular;  Laterality: Left;  . BASCILIC VEIN TRANSPOSITION Right 11/08/2014   Procedure: FIRST STAGE BASILIC VEIN TRANSPOSITION;  Surgeon: Angelia Mould, MD;  Location: [place];  Service: Vascular;  Laterality: Right;    . BASCILIC VEIN TRANSPOSITION Right 01/18/2015   Procedure: SECOND STAGE BASILIC VEIN TRANSPOSITION;  Surgeon: Angelia Mould, MD;  Location: [facility] OR;  Service: Vascular;  Laterality: Right;  . ESOPHAGOGASTRODUODENOSCOPY N/A 03/31/2013   Procedure: ESOPHAGOGASTRODUODENOSCOPY (EGD);  Surgeon: Gatha Mayer, MD;  Location: [facility] ENDOSCOPY;  Service: Endoscopy;  Laterality: N/A;  . EYE SURGERY     laser surgery  . FISTULOGRAM Left 10/29/2014   Procedure: FISTULOGRAM;  Surgeon: Angelia Mould, MD;  Location: [facility] CATH LAB;  Service: Cardiovascular;  Laterality: Left;  . LIGATION OF ARTERIOVENOUS  FISTULA Left 04/21/2016   Procedure: LIGATION OF ARTERIOVENOUS  FISTULA LEFT ARM;  Surgeon: Elam Dutch, MD;  Location: [place];  Service: Vascular;  Laterality: Left;  . PATCH ANGIOPLASTY Right 01/18/2015   Procedure: BASILIC VEIN PATCH ANGIOPLASTY  USING VASCUGUARD PATCH;  Surgeon: Angelia Mould, MD;  Location: [facility] OR;  Service: Vascular;  Laterality: Right;   Social History   Occupational History  . Retired    Social History Main Topics  . Smoking status: Former Smoker    Packs/day: 0.35    Years: 40.00    Types: Cigarettes    Quit date: 05/10/2012  . Smokeless tobacco: Never Used  . Alcohol use No  . Drug use:      Comment: marijuana; quit in early 1980's  . Sexual activity: Yes

## 2016-06-12 ENCOUNTER — Telehealth (INDEPENDENT_AMBULATORY_CARE_PROVIDER_SITE_OTHER): Payer: Self-pay | Admitting: Orthopedic Surgery

## 2016-06-12 ENCOUNTER — Encounter: Payer: Self-pay | Admitting: Vascular Surgery

## 2016-06-12 DIAGNOSIS — N2581 Secondary hyperparathyroidism of renal origin: Secondary | ICD-10-CM | POA: Diagnosis not present

## 2016-06-12 DIAGNOSIS — D509 Iron deficiency anemia, unspecified: Secondary | ICD-10-CM | POA: Diagnosis not present

## 2016-06-12 DIAGNOSIS — E1122 Type 2 diabetes mellitus with diabetic chronic kidney disease: Secondary | ICD-10-CM | POA: Diagnosis not present

## 2016-06-12 DIAGNOSIS — D631 Anemia in chronic kidney disease: Secondary | ICD-10-CM | POA: Diagnosis not present

## 2016-06-12 DIAGNOSIS — N186 End stage renal disease: Secondary | ICD-10-CM | POA: Diagnosis not present

## 2016-06-12 NOTE — Telephone Encounter (Signed)
PATIENT  CALLED, [facility] DID NOT RECEIVE THE [facility] FOR HER MEDICATION TO TREAT THE INFECTION IN HER TOE.  [facility] [address]   [phone]

## 2016-06-14 DIAGNOSIS — N2581 Secondary hyperparathyroidism of renal origin: Secondary | ICD-10-CM | POA: Diagnosis not present

## 2016-06-14 DIAGNOSIS — E1122 Type 2 diabetes mellitus with diabetic chronic kidney disease: Secondary | ICD-10-CM | POA: Diagnosis not present

## 2016-06-14 DIAGNOSIS — N186 End stage renal disease: Secondary | ICD-10-CM | POA: Diagnosis not present

## 2016-06-14 DIAGNOSIS — D509 Iron deficiency anemia, unspecified: Secondary | ICD-10-CM | POA: Diagnosis not present

## 2016-06-14 DIAGNOSIS — D631 Anemia in chronic kidney disease: Secondary | ICD-10-CM | POA: Diagnosis not present

## 2016-06-15 ENCOUNTER — Other Ambulatory Visit (INDEPENDENT_AMBULATORY_CARE_PROVIDER_SITE_OTHER): Payer: Self-pay | Admitting: Family

## 2016-06-15 ENCOUNTER — Telehealth: Payer: Self-pay | Admitting: Vascular Surgery

## 2016-06-15 MED ORDER — MUPIROCIN 2 % EX OINT: 1.0000 "application " | TOPICAL_OINTMENT | Freq: Two times a day (BID) | CUTANEOUS | 6 refills | Status: DC

## 2016-06-15 NOTE — Telephone Encounter (Signed)
-----   Message from Mena Goes, RN sent at 06/14/2016  8:07 PM EST ----- Regarding: RE: Appt Question Yep just give the first available. They will call us back if that's not good enough, Put them on the waitlist though.  ----- Message ----- From: Lujean Amel Sent: 06/12/2016   9:06 AM To: Mena Goes, RN Subject: Appt Question                                  Charma Igo are my go to person to run stuff by....and I apologize for piling more work onto your already busy schedule. But I do need your help with this patient. Dr.Duda referred this patient back to [place] for gangrene of L great and second toe. She had abi's here on 06/08/16.  It does seem she has been seen before by Dr.Fields 03/2016 and also Dr.Dickson for placement of AVG/AVF. So technically, she would be a new patient right?? :( The first appt I have is 12/14 with Dr.Chen unless someone cancels or I move another new pt. Just wanted to know if I could squeeze her in as a follow up appt. Thanks in advance for your help!! Anne Ng

## 2016-06-15 NOTE — Telephone Encounter (Signed)
I contacted the patient on Friday 06/12/16 with an appt to see Dr.Early on 06/16/16. The patient can keep this appt. awt

## 2016-06-16 ENCOUNTER — Other Ambulatory Visit: Payer: Self-pay

## 2016-06-16 ENCOUNTER — Encounter: Payer: Self-pay | Admitting: Vascular Surgery

## 2016-06-16 ENCOUNTER — Ambulatory Visit (INDEPENDENT_AMBULATORY_CARE_PROVIDER_SITE_OTHER): Payer: Self-pay | Admitting: Vascular Surgery

## 2016-06-16 VITALS — BP 158/78 | HR 82 | Temp 97.3°F | Resp 16 | Ht 65.0 in | Wt 145.0 lb

## 2016-06-16 DIAGNOSIS — I96 Gangrene, not elsewhere classified: Secondary | ICD-10-CM

## 2016-06-16 DIAGNOSIS — M79675 Pain in left toe(s): Secondary | ICD-10-CM

## 2016-06-16 DIAGNOSIS — E1122 Type 2 diabetes mellitus with diabetic chronic kidney disease: Secondary | ICD-10-CM | POA: Diagnosis not present

## 2016-06-16 DIAGNOSIS — I70245 Atherosclerosis of native arteries of left leg with ulceration of other part of foot: Secondary | ICD-10-CM

## 2016-06-16 DIAGNOSIS — N186 End stage renal disease: Secondary | ICD-10-CM | POA: Diagnosis not present

## 2016-06-16 DIAGNOSIS — N2581 Secondary hyperparathyroidism of renal origin: Secondary | ICD-10-CM | POA: Diagnosis not present

## 2016-06-16 DIAGNOSIS — D631 Anemia in chronic kidney disease: Secondary | ICD-10-CM | POA: Diagnosis not present

## 2016-06-16 DIAGNOSIS — D509 Iron deficiency anemia, unspecified: Secondary | ICD-10-CM | POA: Diagnosis not present

## 2016-06-16 MED ORDER — TRAMADOL HCL 50 MG PO TABS: 50.0000 mg | ORAL_TABLET | Freq: Four times a day (QID) | ORAL | 0 refills | Status: DC | PRN

## 2016-06-16 NOTE — Progress Notes (Signed)
Vascular and Vein Specialist of [place]  Patient name: Destiny Day MRN: [id number] DOB: 1951/05/07 Sex: female  REASON FOR VISIT: Evaluation for ulceration and osteomyelitis left great toe with arterial insufficiency  HPI: Destiny Day is a 65 y.o. female referred by Dr. Sharol Given for evaluation of lower from the arterial insufficiency left leg. She is a very pleasant 65 year old female with end-stage renal disease. She has a long history of insulin-dependent diabetes. She has a ulceration on the distal tip of her left great toe and x-rays showed osteomyelitis. Dr. Sharol Given is seen her for a possible toe amputation and referred her for evaluation of adequate arterial flow. She does have symptoms consistent with calf claudication bilaterally which is been present for a great period of time. She has never had any other tissue loss. Does have pain consistent with rest pain in her left foot as well.  Past Medical History:  Diagnosis Date  . Abdominal bruit   . Anemia   . Anxiety   . Arthritis    Osteoarthritis  . Asthma   . Cervical disc disease    "pinced nerve"  . CHF (congestive heart failure) ([place])   . Complication of anesthesia    " after I got home from my last procedure, I started itching."  . Diabetes mellitus    insulin dependent  . Diverticulitis   . ESRD on peritoneal dialysis [facility])    Hemodialysis - MWF  . GERD (gastroesophageal reflux disease)    from medications  . GI bleed 03/31/2013  . Hyperlipidemia   . Hypertension   . Osteoporosis   . Pneumonia   . Seasonal allergies   . Shortness of breath dyspnea    WIth exertion  . Sleep apnea    can't afford cpap    Family History  Problem Relation Age of Onset  . Other Mother     not sure of cause of death  . Diabetes Father   . Pancreatic cancer Maternal Grandmother   . Colon cancer Neg Hx     SOCIAL HISTORY: Social History  Substance Use Topics  . Smoking status:  Former Smoker    Packs/day: 0.35    Years: 40.00    Types: Cigarettes    Quit date: 05/10/2012  . Smokeless tobacco: Never Used  . Alcohol use No    Allergies  Allergen Reactions  . Lisinopril Cough  . Prednisone Other (See Comments)    Excessive fluid buildup    Current Outpatient Prescriptions  Medication Sig Dispense Refill  . albuterol (PROVENTIL HFA;VENTOLIN HFA) 108 (90 BASE) MCG/ACT inhaler Inhale 1-2 puffs into the lungs every 6 (six) hours as needed for wheezing or shortness of breath.    Marland Kitchen amLODipine (NORVASC) 10 MG tablet Take 10 mg by mouth daily.    Marland Kitchen aspirin 81 MG chewable tablet Chew 81 mg by mouth daily.    Marland Kitchen atorvastatin (LIPITOR) 20 MG tablet Take 20 mg by mouth daily at 6 PM.    . budesonide-formoterol (SYMBICORT) 160-4.5 MCG/ACT inhaler Inhale 1 puff into the lungs every evening.    . calcium acetate, Phos Binder, (PHOSLYRA) 667 MG/5ML SOLN Take by mouth 3 (three) times daily with meals.    . carvedilol (COREG) 12.5 MG tablet Take 2 tablets (25 mg total) by mouth 2 (two) times daily with a meal. 120 tablet 0  . cinacalcet (SENSIPAR) 30 MG tablet Take 30 mg by mouth daily.    . insulin aspart (NOVOLOG) 100 UNIT/ML injection  Inject 0-8 Units into the skin 3 (three) times daily before meals. Sliding scale    . isosorbide-hydrALAZINE (BIDIL) 20-37.5 MG tablet Take 1 tablet by mouth 2 (two) times daily. 60 tablet 0  . LEVEMIR FLEXTOUCH 100 UNIT/ML Pen Inject 10 Units into the muscle at bedtime.     Marland Kitchen loratadine (CLARITIN) 10 MG tablet Take 10 mg by mouth daily as needed for allergies.    . multivitamin (RENA-VIT) TABS tablet Take 1 tablet by mouth daily.  5  . mupirocin ointment (BACTROBAN) 2 % Apply 1 application topically 2 (two) times daily. 22 g 6  . omeprazole (PRILOSEC) 20 MG capsule Take 20 mg by mouth daily.    . potassium chloride SA (K-DUR,KLOR-CON) 20 MEQ tablet Take 20 mEq by mouth 2 (two) times daily.    . traMADol (ULTRAM) 50 MG tablet Take 1 tablet (50  mg total) by mouth every 6 (six) hours as needed. 30 tablet 0  . Vitamin D, Ergocalciferol, (DRISDOL) 50000 units CAPS capsule Take 50,000 Units by mouth every 7 (seven) days. Mondays  3   No current facility-administered medications for this visit.     REVIEW OF SYSTEMS:  [X]  denotes positive finding, [ ]  denotes negative finding Cardiac  Comments:  Chest pain or chest pressure:    Shortness of breath upon exertion:    Short of breath when lying flat:    Irregular heart rhythm:        Vascular    Pain in calf, thigh, or hip brought on by ambulation:    Pain in feet at night that wakes you up from your sleep:     Blood clot in your veins:    Leg swelling:           PHYSICAL EXAM: Vitals:   06/16/16 1004 06/16/16 1005  BP: (!) 166/76 (!) 158/78  Pulse: 82   Resp: 16   Temp: 97.3 F (36.3 C)   TempSrc: Oral   SpO2: 95%   Weight: 145 lb (65.8 kg)   Height: 5\' 5"  (1.651 m)     GENERAL: The patient is a well-nourished female, in no acute distress. The vital signs are documented above. CARDIOVASCULAR: Palpable femoral pulses bilaterally. Absent popliteal and distal pulses bilaterally. Does have a patent left upper arm AV graft PULMONARY: There is good air exchange  MUSCULOSKELETAL: There are no major deformities or cyanosis. NEUROLOGIC: No focal weakness or paresthesias are detected. SKIN: There are no ulcers or rashes noted. He does have an open ulceration over the tip of her left great toe with no fluctuance PSYCHIATRIC: The patient has a normal affect.  DATA:  Noninvasive studies of her left foot reveal dampened flow. This is inaudible in the dorsalis pedis her ankle arm index is 0.46 in the posterior tibial with monophasic waveform  MEDICAL ISSUES: Severe bilateral [place] arterial insufficiency left greater than right with tissue loss in her great toe. Discussed this at length with the patient. Have recommended arteriography for further evaluation. Explained that she  might be a candidate for endovascular treatment but also is likely would require a femoral-popliteal bypass for improvement of flow and healing of her trip great toe. That she is certainly at risk for higher level of amputation if improved arterial flow is not obtained. She understands. We will proceed with this one week from today and her next irregular nondialysis day    Rosetta Posner, MD [facility] Vascular and Vein Specialists of [facility] Tel [phone] Pager [phone]  [phone]  

## 2016-06-19 DIAGNOSIS — E1122 Type 2 diabetes mellitus with diabetic chronic kidney disease: Secondary | ICD-10-CM | POA: Diagnosis not present

## 2016-06-19 DIAGNOSIS — N186 End stage renal disease: Secondary | ICD-10-CM | POA: Diagnosis not present

## 2016-06-19 DIAGNOSIS — D509 Iron deficiency anemia, unspecified: Secondary | ICD-10-CM | POA: Diagnosis not present

## 2016-06-19 DIAGNOSIS — D631 Anemia in chronic kidney disease: Secondary | ICD-10-CM | POA: Diagnosis not present

## 2016-06-19 DIAGNOSIS — N2581 Secondary hyperparathyroidism of renal origin: Secondary | ICD-10-CM | POA: Diagnosis not present

## 2016-06-22 DIAGNOSIS — N2581 Secondary hyperparathyroidism of renal origin: Secondary | ICD-10-CM | POA: Diagnosis not present

## 2016-06-22 DIAGNOSIS — N186 End stage renal disease: Secondary | ICD-10-CM | POA: Diagnosis not present

## 2016-06-22 DIAGNOSIS — D509 Iron deficiency anemia, unspecified: Secondary | ICD-10-CM | POA: Diagnosis not present

## 2016-06-22 DIAGNOSIS — E1122 Type 2 diabetes mellitus with diabetic chronic kidney disease: Secondary | ICD-10-CM | POA: Diagnosis not present

## 2016-06-22 DIAGNOSIS — D631 Anemia in chronic kidney disease: Secondary | ICD-10-CM | POA: Diagnosis not present

## 2016-06-23 ENCOUNTER — Encounter (HOSPITAL_COMMUNITY)
Admission: RE | Disposition: A | Discharge status: Home / Self Care | Payer: Self-pay | Source: Ambulatory Visit | Attending: Surgery

## 2016-06-23 ENCOUNTER — Other Ambulatory Visit: Payer: Self-pay | Admitting: *Deleted

## 2016-06-23 ENCOUNTER — Ambulatory Visit (HOSPITAL_COMMUNITY)
Admission: RE | Admit: 2016-06-23 | Discharge: 2016-06-24 | Disposition: A | Discharge status: Home / Self Care | Payer: Medicare Other | Source: Ambulatory Visit | Attending: Surgery | Admitting: Surgery

## 2016-06-23 DIAGNOSIS — Z7982 Long term (current) use of aspirin: Secondary | ICD-10-CM | POA: Insufficient documentation

## 2016-06-23 DIAGNOSIS — Z87891 Personal history of nicotine dependence: Secondary | ICD-10-CM | POA: Diagnosis not present

## 2016-06-23 DIAGNOSIS — N186 End stage renal disease: Secondary | ICD-10-CM | POA: Diagnosis not present

## 2016-06-23 DIAGNOSIS — Z992 Dependence on renal dialysis: Secondary | ICD-10-CM | POA: Insufficient documentation

## 2016-06-23 DIAGNOSIS — Z794 Long term (current) use of insulin: Secondary | ICD-10-CM | POA: Diagnosis not present

## 2016-06-23 DIAGNOSIS — I509 Heart failure, unspecified: Secondary | ICD-10-CM | POA: Insufficient documentation

## 2016-06-23 DIAGNOSIS — L97529 Non-pressure chronic ulcer of other part of left foot with unspecified severity: Secondary | ICD-10-CM | POA: Insufficient documentation

## 2016-06-23 DIAGNOSIS — I70245 Atherosclerosis of native arteries of left leg with ulceration of other part of foot: Secondary | ICD-10-CM | POA: Diagnosis not present

## 2016-06-23 DIAGNOSIS — E1122 Type 2 diabetes mellitus with diabetic chronic kidney disease: Secondary | ICD-10-CM | POA: Diagnosis not present

## 2016-06-23 DIAGNOSIS — E1169 Type 2 diabetes mellitus with other specified complication: Secondary | ICD-10-CM | POA: Diagnosis not present

## 2016-06-23 DIAGNOSIS — E785 Hyperlipidemia, unspecified: Secondary | ICD-10-CM | POA: Diagnosis not present

## 2016-06-23 DIAGNOSIS — E1152 Type 2 diabetes mellitus with diabetic peripheral angiopathy with gangrene: Secondary | ICD-10-CM | POA: Insufficient documentation

## 2016-06-23 DIAGNOSIS — K219 Gastro-esophageal reflux disease without esophagitis: Secondary | ICD-10-CM | POA: Insufficient documentation

## 2016-06-23 DIAGNOSIS — I739 Peripheral vascular disease, unspecified: Secondary | ICD-10-CM

## 2016-06-23 DIAGNOSIS — M869 Osteomyelitis, unspecified: Secondary | ICD-10-CM | POA: Diagnosis not present

## 2016-06-23 DIAGNOSIS — J45909 Unspecified asthma, uncomplicated: Secondary | ICD-10-CM | POA: Insufficient documentation

## 2016-06-23 DIAGNOSIS — G4730 Sleep apnea, unspecified: Secondary | ICD-10-CM | POA: Insufficient documentation

## 2016-06-23 DIAGNOSIS — I132 Hypertensive heart and chronic kidney disease with heart failure and with stage 5 chronic kidney disease, or end stage renal disease: Secondary | ICD-10-CM | POA: Insufficient documentation

## 2016-06-23 DIAGNOSIS — I70262 Atherosclerosis of native arteries of extremities with gangrene, left leg: Secondary | ICD-10-CM | POA: Diagnosis not present

## 2016-06-23 DIAGNOSIS — Z01810 Encounter for preprocedural cardiovascular examination: Secondary | ICD-10-CM

## 2016-06-23 DIAGNOSIS — Z79899 Other long term (current) drug therapy: Secondary | ICD-10-CM | POA: Diagnosis not present

## 2016-06-23 HISTORY — PX: PERIPHERAL VASCULAR CATHETERIZATION: SHX172C

## 2016-06-23 LAB — POCT I-STAT, CHEM 8
BUN: 29 mg/dL — ABNORMAL HIGH (ref 6–20)
Calcium, Ion: 1.03 mmol/L — ABNORMAL LOW (ref 1.15–1.40)
Chloride: 96 mmol/L — ABNORMAL LOW (ref 101–111)
Creatinine, Ser: 5 mg/dL — ABNORMAL HIGH (ref 0.44–1.00)
Glucose, Bld: 147 mg/dL — ABNORMAL HIGH (ref 65–99)
HCT: 38 % (ref 36.0–46.0)
Hemoglobin: 12.9 g/dL (ref 12.0–15.0)
Potassium: 4.7 mmol/L (ref 3.5–5.1)
Sodium: 133 mmol/L — ABNORMAL LOW (ref 135–145)
TCO2: 27 mmol/L (ref 0–100)

## 2016-06-23 LAB — POCT ACTIVATED CLOTTING TIME
Activated Clotting Time: 208 seconds
Activated Clotting Time: 230 seconds
Activated Clotting Time: 180 seconds
Activated Clotting Time: 191 seconds

## 2016-06-23 LAB — GLUCOSE, CAPILLARY
Glucose-Capillary: 69 mg/dL (ref 65–99)
Glucose-Capillary: 151 mg/dL — ABNORMAL HIGH (ref 65–99)
Glucose-Capillary: 317 mg/dL — ABNORMAL HIGH (ref 65–99)

## 2016-06-23 SURGERY — ABDOMINAL AORTOGRAM W/LOWER EXTREMITY

## 2016-06-23 MED ORDER — INSULIN ASPART 100 UNIT/ML ~~LOC~~ SOLN: 0.0000 [IU] | Freq: Three times a day (TID) | SUBCUTANEOUS | Status: DC

## 2016-06-23 MED ORDER — ONDANSETRON HCL 4 MG/2ML IJ SOLN
4.0000 mg | Freq: Four times a day (QID) | INTRAMUSCULAR | Status: DC | PRN
  Administered 2016-06-24: 4 mg via INTRAVENOUS
  Filled 2016-06-23: qty 2

## 2016-06-23 MED ORDER — CARVEDILOL 12.5 MG PO TABS
12.5000 mg | ORAL_TABLET | Freq: Once | ORAL | Status: AC
  Administered 2016-06-23: 12.5 mg via ORAL
  Filled 2016-06-23: qty 1

## 2016-06-23 MED ORDER — MIDAZOLAM HCL 2 MG/2ML IJ SOLN
INTRAMUSCULAR | Status: AC
  Filled 2016-06-23: qty 2

## 2016-06-23 MED ORDER — CARVEDILOL 25 MG PO TABS: 25.0000 mg | ORAL_TABLET | Freq: Two times a day (BID) | ORAL | Status: DC

## 2016-06-23 MED ORDER — LABETALOL HCL 5 MG/ML IV SOLN
10.0000 mg | INTRAVENOUS | Status: DC | PRN
  Administered 2016-06-23 (×2): 10 mg via INTRAVENOUS

## 2016-06-23 MED ORDER — MIDAZOLAM HCL 2 MG/2ML IJ SOLN
INTRAMUSCULAR | Status: DC | PRN
  Administered 2016-06-23 (×2): 1 mg via INTRAVENOUS
  Administered 2016-06-23: 2 mg via INTRAVENOUS
  Administered 2016-06-23: 1 mg via INTRAVENOUS

## 2016-06-23 MED ORDER — HYDRALAZINE HCL 20 MG/ML IJ SOLN: 5.0000 mg | INTRAMUSCULAR | Status: DC | PRN

## 2016-06-23 MED ORDER — HEPARIN SODIUM (PORCINE) 1000 UNIT/ML IJ SOLN
INTRAMUSCULAR | Status: DC | PRN
  Administered 2016-06-23: 6500 [IU] via INTRAVENOUS
  Administered 2016-06-23: 1000 [IU] via INTRAVENOUS

## 2016-06-23 MED ORDER — LORATADINE 10 MG PO TABS: 10.0000 mg | ORAL_TABLET | Freq: Every day | ORAL | Status: DC | PRN

## 2016-06-23 MED ORDER — FENTANYL CITRATE (PF) 100 MCG/2ML IJ SOLN
INTRAMUSCULAR | Status: DC | PRN
  Administered 2016-06-23 (×3): 25 ug via INTRAVENOUS
  Administered 2016-06-23: 50 ug via INTRAVENOUS

## 2016-06-23 MED ORDER — MOMETASONE FURO-FORMOTEROL FUM 200-5 MCG/ACT IN AERO
2.0000 | INHALATION_SPRAY | Freq: Two times a day (BID) | RESPIRATORY_TRACT | Status: DC
  Administered 2016-06-24: 2 via RESPIRATORY_TRACT
  Filled 2016-06-23: qty 8.8

## 2016-06-23 MED ORDER — CINACALCET HCL 30 MG PO TABS
30.0000 mg | ORAL_TABLET | Freq: Every evening | ORAL | Status: DC
  Administered 2016-06-23: 30 mg via ORAL
  Filled 2016-06-23: qty 1

## 2016-06-23 MED ORDER — METOPROLOL TARTRATE 5 MG/5ML IV SOLN: 2.0000 mg | INTRAVENOUS | Status: DC | PRN

## 2016-06-23 MED ORDER — AMLODIPINE BESYLATE 10 MG PO TABS
10.0000 mg | ORAL_TABLET | Freq: Every day | ORAL | Status: DC
  Administered 2016-06-23: 10 mg via ORAL
  Filled 2016-06-23: qty 1

## 2016-06-23 MED ORDER — FENTANYL CITRATE (PF) 100 MCG/2ML IJ SOLN
INTRAMUSCULAR | Status: AC
  Filled 2016-06-23: qty 2

## 2016-06-23 MED ORDER — IODIXANOL 320 MG/ML IV SOLN
INTRAVENOUS | Status: DC | PRN
Start: 2016-06-23 — End: 2016-06-23
  Administered 2016-06-23: 150 mL via INTRA_ARTERIAL

## 2016-06-23 MED ORDER — ALUM & MAG HYDROXIDE-SIMETH 200-200-20 MG/5ML PO SUSP: 15.0000 mL | ORAL | Status: DC | PRN

## 2016-06-23 MED ORDER — MORPHINE SULFATE (PF) 2 MG/ML IV SOLN: 2.0000 mg | INTRAVENOUS | Status: DC | PRN

## 2016-06-23 MED ORDER — MORPHINE SULFATE (PF) 10 MG/ML IV SOLN
2.0000 mg | INTRAVENOUS | Status: DC | PRN
  Administered 2016-06-23: 2 mg via INTRAVENOUS

## 2016-06-23 MED ORDER — VITAMIN D (ERGOCALCIFEROL) 1.25 MG (50000 UNIT) PO CAPS: 50000.0000 [IU] | ORAL_CAPSULE | ORAL | Status: DC

## 2016-06-23 MED ORDER — SODIUM CHLORIDE 0.9 % IV SOLN
1.0000 mL/kg/h | INTRAVENOUS | Status: DC
  Administered 2016-06-23: 1 mL/kg/h via INTRAVENOUS

## 2016-06-23 MED ORDER — CLOPIDOGREL BISULFATE 75 MG PO TABS: 75.0000 mg | ORAL_TABLET | Freq: Every day | ORAL | 11 refills | Status: DC

## 2016-06-23 MED ORDER — ALBUTEROL SULFATE (2.5 MG/3ML) 0.083% IN NEBU: 2.5000 mg | INHALATION_SOLUTION | Freq: Four times a day (QID) | RESPIRATORY_TRACT | Status: DC | PRN

## 2016-06-23 MED ORDER — INSULIN ASPART 100 UNIT/ML ~~LOC~~ SOLN
0.0000 [IU] | Freq: Three times a day (TID) | SUBCUTANEOUS | Status: DC
  Administered 2016-06-23: 7 [IU] via SUBCUTANEOUS

## 2016-06-23 MED ORDER — RENA-VITE PO TABS
1.0000 | ORAL_TABLET | Freq: Every evening | ORAL | Status: DC
  Administered 2016-06-23: 1 via ORAL
  Filled 2016-06-23: qty 1

## 2016-06-23 MED ORDER — LIDOCAINE HCL (PF) 1 % IJ SOLN
INTRAMUSCULAR | Status: DC | PRN
  Administered 2016-06-23: 30 mL

## 2016-06-23 MED ORDER — ASPIRIN 81 MG PO CHEW
81.0000 mg | CHEWABLE_TABLET | Freq: Every evening | ORAL | Status: DC
Start: 2016-06-23 — End: 2016-06-24
  Administered 2016-06-23: 81 mg via ORAL
  Filled 2016-06-23: qty 1

## 2016-06-23 MED ORDER — HEPARIN SODIUM (PORCINE) 1000 UNIT/ML IJ SOLN
INTRAMUSCULAR | Status: AC
  Filled 2016-06-23: qty 1

## 2016-06-23 MED ORDER — SODIUM CHLORIDE 0.9 % IV SOLN: INTRAVENOUS | Status: DC

## 2016-06-23 MED ORDER — FERRIC CITRATE 1 GM 210 MG(FE) PO TABS: 630.0000 mg | ORAL_TABLET | Freq: Three times a day (TID) | ORAL | Status: DC

## 2016-06-23 MED ORDER — HEPARIN (PORCINE) IN NACL 2-0.9 UNIT/ML-% IJ SOLN
INTRAMUSCULAR | Status: DC | PRN
Start: 2016-06-23 — End: 2016-06-23
  Administered 2016-06-23: 1000 mL via INTRA_ARTERIAL

## 2016-06-23 MED ORDER — DOCUSATE SODIUM 100 MG PO CAPS: 100.0000 mg | ORAL_CAPSULE | Freq: Every day | ORAL | Status: DC

## 2016-06-23 MED ORDER — PANTOPRAZOLE SODIUM 40 MG PO TBEC
40.0000 mg | DELAYED_RELEASE_TABLET | Freq: Every day | ORAL | Status: DC
  Administered 2016-06-23: 40 mg via ORAL
  Filled 2016-06-23: qty 1

## 2016-06-23 MED ORDER — MORPHINE SULFATE (PF) 4 MG/ML IV SOLN
INTRAVENOUS | Status: AC
  Filled 2016-06-23: qty 1

## 2016-06-23 MED ORDER — HEPARIN (PORCINE) IN NACL 2-0.9 UNIT/ML-% IJ SOLN
INTRAMUSCULAR | Status: AC
  Filled 2016-06-23: qty 1000

## 2016-06-23 MED ORDER — PHENOL 1.4 % MT LIQD: 1.0000 | OROMUCOSAL | Status: DC | PRN

## 2016-06-23 MED ORDER — ISOSORB DINITRATE-HYDRALAZINE 20-37.5 MG PO TABS
1.0000 | ORAL_TABLET | Freq: Two times a day (BID) | ORAL | Status: DC
  Administered 2016-06-23: 1 via ORAL
  Filled 2016-06-23: qty 1

## 2016-06-23 MED ORDER — LABETALOL HCL 5 MG/ML IV SOLN
INTRAVENOUS | Status: AC
  Filled 2016-06-23: qty 4

## 2016-06-23 MED ORDER — GUAIFENESIN-DM 100-10 MG/5ML PO SYRP: 15.0000 mL | ORAL_SOLUTION | ORAL | Status: DC | PRN

## 2016-06-23 MED ORDER — ATORVASTATIN CALCIUM 20 MG PO TABS
20.0000 mg | ORAL_TABLET | Freq: Every day | ORAL | Status: DC
  Administered 2016-06-23: 20 mg via ORAL
  Filled 2016-06-23: qty 1

## 2016-06-23 MED ORDER — OXYCODONE HCL 5 MG PO TABS
5.0000 mg | ORAL_TABLET | ORAL | Status: DC | PRN
  Administered 2016-06-24: 10 mg via ORAL
  Filled 2016-06-23: qty 2

## 2016-06-23 MED ORDER — SODIUM CHLORIDE 0.9% FLUSH: 3.0000 mL | INTRAVENOUS | Status: DC | PRN

## 2016-06-23 MED ORDER — LIDOCAINE HCL (PF) 1 % IJ SOLN
INTRAMUSCULAR | Status: AC
  Filled 2016-06-23: qty 30

## 2016-06-23 SURGICAL SUPPLY — 22 items
CATH OMNI FLUSH 5F 65CM (CATHETERS) ×3 IMPLANT
CATH SOFT-VU 4F 65 STRAIGHT (CATHETERS) ×2 IMPLANT
CATH SOFT-VU STRAIGHT 4F 65CM (CATHETERS) ×1
COVER PRB 48X5XTLSCP FOLD TPE (BAG) ×4 IMPLANT
COVER PROBE 5X48 (BAG) ×2
DEVICE TORQUE H2O (MISCELLANEOUS) ×3 IMPLANT
DRAPE ZERO GRAVITY STERILE (DRAPES) ×6 IMPLANT
GUIDEWIRE ANGLED .035X150CM (WIRE) ×3 IMPLANT
KIT ENCORE 26 ADVANTAGE (KITS) ×3 IMPLANT
KIT MICROINTRODUCER STIFF 5F (SHEATH) ×3 IMPLANT
KIT PV (KITS) ×3 IMPLANT
SHEATH BRITE TIP 6FR 35CM (SHEATH) ×3 IMPLANT
SHEATH PINNACLE 5F 10CM (SHEATH) ×3 IMPLANT
SHEATH PINNACLE MP 7F 45CM (SHEATH) ×3 IMPLANT
STENT EXPRESS LD 7X17X75 (Permanent Stent) ×3 IMPLANT
STENT EXPRESS LD 8X27X75 (Permanent Stent) ×3 IMPLANT
SYR MEDRAD MARK V 150ML (SYRINGE) ×3 IMPLANT
TRANSDUCER W/STOPCOCK (MISCELLANEOUS) ×3 IMPLANT
TRAY PV CATH (CUSTOM PROCEDURE TRAY) ×3 IMPLANT
WIRE BENTSON .035X145CM (WIRE) ×3 IMPLANT
WIRE J 3MM .035X145CM (WIRE) IMPLANT
WIRE ROSEN-J .035X180CM (WIRE) ×3 IMPLANT

## 2016-06-23 NOTE — Interval H&P Note (Signed)
History and Physical Interval Note:  06/23/2016 12:09 PM  Destiny Day  has presented today for surgery, with the diagnosis of pvd with left great toe gangreen  The various methods of treatment have been discussed with the patient and family. After consideration of risks, benefits and other options for treatment, the patient has consented to  Procedure(s): Abdominal Aortogram w/Lower Extremity (N/A) as a surgical intervention .  The patient's history has been reviewed, patient examined, no change in status, stable for surgery.  I have reviewed the patient's chart and labs.  Questions were answered to the patient's satisfaction.     Annamarie Major

## 2016-06-23 NOTE — Progress Notes (Signed)
Site area: left groin Site Prior to Removal:  Level 0 Pressure Applied For:  20 minutes Manual:   yes Patient Status During Pull:  stable Post Pull Site:  Level  0 Post Pull Instructions Given:  yes Post Pull Pulses Present: yes Dressing Applied:  tegaderm Bedrest begins @  8527 Comments:

## 2016-06-23 NOTE — H&P (View-Only) (Signed)
Vascular and Vein Specialist of [place]  Patient name: Destiny Day MRN: [id number] DOB: [birth year] Sex: female  REASON FOR VISIT: Evaluation for ulceration and osteomyelitis left great toe with arterial insufficiency  HPI: Destiny Day is a 65 y.o. female referred by Dr. Sharol Given for evaluation of lower from the arterial insufficiency left leg. She is a very pleasant 65 year old female with end-stage renal disease. She has a long history of insulin-dependent diabetes. She has a ulceration on the distal tip of her left great toe and x-rays showed osteomyelitis. Dr. Sharol Given is seen her for a possible toe amputation and referred her for evaluation of adequate arterial flow. She does have symptoms consistent with calf claudication bilaterally which is been present for a great period of time. She has never had any other tissue loss. Does have pain consistent with rest pain in her left foot as well.  Past Medical History:  Diagnosis Date  . Abdominal bruit   . Anemia   . Anxiety   . Arthritis    Osteoarthritis  . Asthma   . Cervical disc disease    "pinced nerve"  . CHF (congestive heart failure) ([place])   . Complication of anesthesia    " after I got home from my last procedure, I started itching."  . Diabetes mellitus    insulin dependent  . Diverticulitis   . ESRD on peritoneal dialysis [facility])    Hemodialysis - MWF  . GERD (gastroesophageal reflux disease)    from medications  . GI bleed 03/31/2013  . Hyperlipidemia   . Hypertension   . Osteoporosis   . Pneumonia   . Seasonal allergies   . Shortness of breath dyspnea    WIth exertion  . Sleep apnea    can't afford cpap    Family History  Problem Relation Age of Onset  . Other Mother     not sure of cause of death  . Diabetes Father   . Pancreatic cancer Maternal Grandmother   . Colon cancer Neg Hx     SOCIAL HISTORY: Social History  Substance Use Topics  . Smoking status:  Former Smoker    Packs/day: 0.35    Years: 40.00    Types: Cigarettes    Quit date: 05/10/2012  . Smokeless tobacco: Never Used  . Alcohol use No    Allergies  Allergen Reactions  . Lisinopril Cough  . Prednisone Other (See Comments)    Excessive fluid buildup    Current Outpatient Prescriptions  Medication Sig Dispense Refill  . albuterol (PROVENTIL HFA;VENTOLIN HFA) 108 (90 BASE) MCG/ACT inhaler Inhale 1-2 puffs into the lungs every 6 (six) hours as needed for wheezing or shortness of breath.    Marland Kitchen amLODipine (NORVASC) 10 MG tablet Take 10 mg by mouth daily.    Marland Kitchen aspirin 81 MG chewable tablet Chew 81 mg by mouth daily.    Marland Kitchen atorvastatin (LIPITOR) 20 MG tablet Take 20 mg by mouth daily at 6 PM.    . budesonide-formoterol (SYMBICORT) 160-4.5 MCG/ACT inhaler Inhale 1 puff into the lungs every evening.    . calcium acetate, Phos Binder, (PHOSLYRA) 667 MG/5ML SOLN Take by mouth 3 (three) times daily with meals.    . carvedilol (COREG) 12.5 MG tablet Take 2 tablets (25 mg total) by mouth 2 (two) times daily with a meal. 120 tablet 0  . cinacalcet (SENSIPAR) 30 MG tablet Take 30 mg by mouth daily.    . insulin aspart (NOVOLOG) 100 UNIT/ML injection  Inject 0-8 Units into the skin 3 (three) times daily before meals. Sliding scale    . isosorbide-hydrALAZINE (BIDIL) 20-37.5 MG tablet Take 1 tablet by mouth 2 (two) times daily. 60 tablet 0  . LEVEMIR FLEXTOUCH 100 UNIT/ML Pen Inject 10 Units into the muscle at bedtime.     Marland Kitchen loratadine (CLARITIN) 10 MG tablet Take 10 mg by mouth daily as needed for allergies.    . multivitamin (RENA-VIT) TABS tablet Take 1 tablet by mouth daily.  5  . mupirocin ointment (BACTROBAN) 2 % Apply 1 application topically 2 (two) times daily. 22 g 6  . omeprazole (PRILOSEC) 20 MG capsule Take 20 mg by mouth daily.    . potassium chloride SA (K-DUR,KLOR-CON) 20 MEQ tablet Take 20 mEq by mouth 2 (two) times daily.    . traMADol (ULTRAM) 50 MG tablet Take 1 tablet (50  mg total) by mouth every 6 (six) hours as needed. 30 tablet 0  . Vitamin D, Ergocalciferol, (DRISDOL) 50000 units CAPS capsule Take 50,000 Units by mouth every 7 (seven) days. Mondays  3   No current facility-administered medications for this visit.     REVIEW OF SYSTEMS:  [X]  denotes positive finding, [ ]  denotes negative finding Cardiac  Comments:  Chest pain or chest pressure:    Shortness of breath upon exertion:    Short of breath when lying flat:    Irregular heart rhythm:        Vascular    Pain in calf, thigh, or hip brought on by ambulation:    Pain in feet at night that wakes you up from your sleep:     Blood clot in your veins:    Leg swelling:           PHYSICAL EXAM: Vitals:   06/16/16 1004 06/16/16 1005  BP: (!) 166/76 (!) 158/78  Pulse: 82   Resp: 16   Temp: 97.3 F (36.3 C)   TempSrc: Oral   SpO2: 95%   Weight: 145 lb (65.8 kg)   Height: 5\' 5"  (1.651 m)     GENERAL: The patient is a well-nourished female, in no acute distress. The vital signs are documented above. CARDIOVASCULAR: Palpable femoral pulses bilaterally. Absent popliteal and distal pulses bilaterally. Does have a patent left upper arm AV graft PULMONARY: There is good air exchange  MUSCULOSKELETAL: There are no major deformities or cyanosis. NEUROLOGIC: No focal weakness or paresthesias are detected. SKIN: There are no ulcers or rashes noted. He does have an open ulceration over the tip of her left great toe with no fluctuance PSYCHIATRIC: The patient has a normal affect.  DATA:  Noninvasive studies of her left foot reveal dampened flow. This is inaudible in the dorsalis pedis her ankle arm index is 0.46 in the posterior tibial with monophasic waveform  MEDICAL ISSUES: Severe bilateral [place] arterial insufficiency left greater than right with tissue loss in her great toe. Discussed this at length with the patient. Have recommended arteriography for further evaluation. Explained that she  might be a candidate for endovascular treatment but also is likely would require a femoral-popliteal bypass for improvement of flow and healing of her trip great toe. That she is certainly at risk for higher level of amputation if improved arterial flow is not obtained. She understands. We will proceed with this one week from today and her next irregular nondialysis day    Rosetta Posner, MD [facility] Vascular and Vein Specialists of [facility] Tel [phone] Pager [phone]  [phone]  

## 2016-06-23 NOTE — Progress Notes (Signed)
Anderson Malta,  RN called and made aware that pt ate a [place] sandwich and one boiled egg at 0530 this morning. Okay to proceed with preop per Anderson Malta.

## 2016-06-23 NOTE — Progress Notes (Signed)
Site area: rt groin  Site Prior to Removal:  Level 0 Pressure Applied For: 20 minutes Manual:   yes Patient Status During Pull:  stable Post Pull Site:  Level  0 Post Pull Instructions Given:  yes Post Pull Pulses Present: yes  Dressing Applied:  Gauze/tegaderm Bedrest begins @  Comments:

## 2016-06-23 NOTE — Op Note (Signed)
Patient name: Destiny Day MRN: [id number] DOB: 06/08/1951 Sex: female  06/23/2016 Pre-operative Diagnosis: Left leg ulcer Post-operative diagnosis:  Same Surgeon:  Annamarie Major Procedure Performed:  1.  Ultrasound-guided access, right femoral artery  2.  Ultrasound-guided access, left femoral artery  3.  Abdominal aortogram  4.  Bilateral lower extremity runoff  5.  Stent, left common iliac artery  6.  Stent, left external iliac artery  7.  Conscious sedation (83 minutes)   Indications:  The patient suffers from end-stage renal disease and diabetes.  She has a left foot ulcer.  She comes in today for evaluation.  Procedure:  The patient was identified in the holding area and taken to room 8.  The patient was then placed supine on the table and prepped and draped in the usual sterile fashion.  A time out was called.  Conscious sedation was administered with the use of IV fentanyl and Versed in continuous physician and nurse monitoring.  Heart rate, blood pressure, and oxygen saturation were continuously monitored.  Ultrasound was used to evaluate the right common femoral artery.  It was patent .  A digital ultrasound image was acquired.  A micropuncture needle was used to access the right common femoral artery under ultrasound guidance.  An 018 wire was advanced without resistance and a micropuncture sheath was placed.  The 018 wire was removed and a benson wire was placed.  The micropuncture sheath was exchanged for a 5 french sheath.  An omniflush catheter was advanced over the wire to the level of L-1.  An abdominal angiogram was obtained.  Next, using the omniflush catheter and a benson wire, as well as a Glidewire and a 4 [place] straight catheter the aortic bifurcation was crossed and the catheter was placed into theleft external iliac artery and left runoff was obtained.  right runoff was performed via retrograde sheath injections.  Findings:   Aortogram:  No significant aortic  stenosis however the artery is heavily calcified.  Heavily calcified common and external iliac arteries on the right without hemodynamic significant lesion.  A high-grade stenosis is identified in the proximal left common iliac artery and the proximal left external iliac artery.  Right Lower Extremity:  The right common femoral and profunda femoral artery are patent.  The superficial femoral artery is occluded with reconstitution of the above-knee popliteal artery at the level of the patella.  The popliteal artery is patent throughout it's course.  There is calcific disease at its distal extent with diseased three-vessel runoff.  Left Lower Extremity:  Left common femoral profunda femoral artery are patent without significant stenosis.  The superficial femoral artery is occluded.  There is reconstitution of the popliteal artery at the level of the patella.  There is two-vessel runoff via the posterior tibial and peroneal artery  Intervention:  After the above images were acquired the decision was made to proceed with intervention.  A pullback gradient was obtained across the external iliac artery which was 40 mm.  I advanced a 7 French 45 cm sheath over a Rosen wire into the left external iliac artery.  The patient was fully heparinized.  I selected a 7 x 17 [employer] balloon expandable stent and ended up stenting the proximal left external iliac artery.  The balloon was taken to 12 atm completion imaging revealed resolution of the stenosis.  Next I performed a pullback pressure across the proximal left common iliac artery and also obtained a 40 mm gradient.  This  could not be treated from the right side and therefore left-sided access was required.  The left common femoral artery was evaluated with ultrasound and found to be calcified but patent.  One percent lidocaine was used for local anesthesia.  The left common femoral artery was cannulated under ultrasound guidance with a micropuncture needle.  An  018 wire was advanced without resistance followed by insertion of a micropuncture sheath.  A 035 Bentson wire was advanced into the aorta.  The micropuncture sheath was exchanged out for a 6 French 35 cm bright tip sheath.  I then selected a [employer] 8 x 27 balloon-expandable stent and stented the proximal left common iliac artery.  Completion imaging showed resolution of the stenosis.  This point time the decision was made to terminate the procedure.  The catheters and wires were removed.  The patient be taken to the shelving area for sheath pull once her platelets are profile corrects.  Impression:  #1  successful stenting of the left common iliac artery using a 8 x 27 balloon-expandable stent.  This was done for a 40 mm gradient  #2  successful stenting of the left external iliac artery using a 7 x 17 balloon-expandable stent.  This was done for 40 mm gradient \  V. Wells , M.D. [facility] [place] Office: [phone] Pager:  [phone] 

## 2016-06-24 ENCOUNTER — Encounter (HOSPITAL_COMMUNITY): Payer: Self-pay | Admitting: Surgery

## 2016-06-24 DIAGNOSIS — N186 End stage renal disease: Secondary | ICD-10-CM | POA: Diagnosis not present

## 2016-06-24 DIAGNOSIS — D509 Iron deficiency anemia, unspecified: Secondary | ICD-10-CM | POA: Diagnosis not present

## 2016-06-24 DIAGNOSIS — I132 Hypertensive heart and chronic kidney disease with heart failure and with stage 5 chronic kidney disease, or end stage renal disease: Secondary | ICD-10-CM | POA: Diagnosis not present

## 2016-06-24 DIAGNOSIS — N2581 Secondary hyperparathyroidism of renal origin: Secondary | ICD-10-CM | POA: Diagnosis not present

## 2016-06-24 DIAGNOSIS — I70262 Atherosclerosis of native arteries of extremities with gangrene, left leg: Secondary | ICD-10-CM | POA: Diagnosis not present

## 2016-06-24 DIAGNOSIS — E1152 Type 2 diabetes mellitus with diabetic peripheral angiopathy with gangrene: Secondary | ICD-10-CM | POA: Diagnosis not present

## 2016-06-24 DIAGNOSIS — E1122 Type 2 diabetes mellitus with diabetic chronic kidney disease: Secondary | ICD-10-CM | POA: Diagnosis not present

## 2016-06-24 DIAGNOSIS — D631 Anemia in chronic kidney disease: Secondary | ICD-10-CM | POA: Diagnosis not present

## 2016-06-24 DIAGNOSIS — E1169 Type 2 diabetes mellitus with other specified complication: Secondary | ICD-10-CM | POA: Diagnosis not present

## 2016-06-24 DIAGNOSIS — L97529 Non-pressure chronic ulcer of other part of left foot with unspecified severity: Secondary | ICD-10-CM | POA: Diagnosis not present

## 2016-06-24 LAB — BASIC METABOLIC PANEL
Anion gap: 13 (ref 5–15)
BUN: 30 mg/dL — ABNORMAL HIGH (ref 6–20)
CO2: 23 mmol/L (ref 22–32)
Calcium: 8.4 mg/dL — ABNORMAL LOW (ref 8.9–10.3)
Chloride: 95 mmol/L — ABNORMAL LOW (ref 101–111)
Creatinine, Ser: 5.65 mg/dL — ABNORMAL HIGH (ref 0.44–1.00)
GFR calc Af Amer: 8 mL/min — ABNORMAL LOW (ref >60)
GFR calc non Af Amer: 7 mL/min — ABNORMAL LOW (ref >60)
Glucose, Bld: 62 mg/dL — ABNORMAL LOW (ref 65–99)
Potassium: 4.3 mmol/L (ref 3.5–5.1)
Sodium: 131 mmol/L — ABNORMAL LOW (ref 135–145)

## 2016-06-24 LAB — CBC
HCT: 28.2 % — ABNORMAL LOW (ref 36.0–46.0)
Hemoglobin: 8.9 g/dL — ABNORMAL LOW (ref 12.0–15.0)
MCH: 28.4 pg (ref 26.0–34.0)
MCHC: 31.6 g/dL (ref 30.0–36.0)
MCV: 90.1 fL (ref 78.0–100.0)
Platelets: 257 10*3/uL (ref 150–400)
RBC: 3.13 MIL/uL — ABNORMAL LOW (ref 3.87–5.11)
RDW: 15.1 % (ref 11.5–15.5)
WBC: 6.6 10*3/uL (ref 4.0–10.5)

## 2016-06-24 LAB — GLUCOSE, CAPILLARY: Glucose-Capillary: 81 mg/dL (ref 65–99)

## 2016-06-24 NOTE — Discharge Summary (Signed)
Discharge Summary    Destiny Day [birth year] 65 y.o. female  [id number]  Admission Date: 06/23/2016  Discharge Date: 06/24/16  Physician: Serafina Mitchell, MD  Admission Diagnosis: pvd with left great toe gangreen   HPI:   This is a 65 y.o. female referred by Dr. Sharol Given for evaluation of lower from the arterial insufficiency left leg. She is a very pleasant 65 year old female with end-stage renal disease. She has a long history of insulin-dependent diabetes. She has a ulceration on the distal tip of her left great toe and x-rays showed osteomyelitis. Dr. Sharol Given is seen her for a possible toe amputation and referred her for evaluation of adequate arterial flow. She does have symptoms consistent with calf claudication bilaterally which is been present for a great period of time. She has never had any other tissue loss. Does have pain consistent with rest pain in her left foot as well.  Hospital Course:  The patient was admitted to the hospital and taken to the operating room on 06/23/2016 and underwent:             1.  Ultrasound-guided access, right femoral artery             2.  Ultrasound-guided access, left femoral artery             3.  Abdominal aortogram             4.  Bilateral lower extremity runoff             5.  Stent, left common iliac artery             6.  Stent, left external iliac artery             7.  Conscious sedation (83 minutes)    The pt tolerated the procedure well and was transported to the PACU in good condition.   She was admitted to the hospital for observation as it was late in the day when the sheaths were removed.    On POD 1, she is doing well.  Her pain is improved.   Her groins are soft without hematomas.  She is discharged home.  The remainder of the hospital course consisted of increasing mobilization and increasing intake of solids without difficulty.  CBC    Component Value Date/Time   WBC 6.6 06/24/2016 0413   RBC 3.13 (L)  06/24/2016 0413   HGB 8.9 (L) 06/24/2016 0413   HCT 28.2 (L) 06/24/2016 0413   PLT 257 06/24/2016 0413   MCV 90.1 06/24/2016 0413   MCH 28.4 06/24/2016 0413   MCHC 31.6 06/24/2016 0413   RDW 15.1 06/24/2016 0413   LYMPHSABS 2.5 10/12/2015 1600   MONOABS 0.9 10/12/2015 1600   EOSABS 0.2 10/12/2015 1600   BASOSABS 0.0 10/12/2015 1600    BMET    Component Value Date/Time   NA 131 (L) 06/24/2016 0413   K 4.3 06/24/2016 0413   CL 95 (L) 06/24/2016 0413   CO2 23 06/24/2016 0413   GLUCOSE 62 (L) 06/24/2016 0413   BUN 30 (H) 06/24/2016 0413   CREATININE 5.65 (H) 06/24/2016 0413   CALCIUM 8.4 (L) 06/24/2016 0413   GFRNONAA 7 (L) 06/24/2016 0413   GFRAA 8 (L) 06/24/2016 0413      Discharge Instructions    Call MD for:  redness, tenderness, or signs of infection (pain, swelling, bleeding, redness, odor or green/yellow discharge around incision site)    Complete by:  As directed  Call MD for:  severe or increased pain, loss or decreased feeling  in affected limb(s)    Complete by:  As directed    Call MD for:  temperature >100.5    Complete by:  As directed    Discharge wound care:    Complete by:  As directed    Continue current wound care to left great toe.  Shower daily with soap and water starting 06/24/16.   Driving Restrictions    Complete by:  As directed    No driving for 24 hours and while taking pain medication.   Lifting restrictions    Complete by:  As directed    No lifting for 2 weeks   Resume previous diet    Complete by:  As directed       Discharge Diagnosis:  pvd with left great toe gangreen  Secondary Diagnosis: Patient Active Problem List   Diagnosis Date Noted  . Atherosclerosis of native artery of extremity ([place]) 06/23/2016  . Acute pulmonary edema ([place]) 10/07/2015  . GERD (gastroesophageal reflux disease) 10/07/2015  . ARF (acute renal failure) ([place])   . Hypothermia 06/12/2015  . Acute on chronic renal failure ([place]) 06/12/2015  . CHF  (congestive heart failure) ([place]) 07/30/2014  . CKD (chronic kidney disease) stage 4, GFR 15-29 ml/min ([facility]) 06/27/2014  . Acute exacerbation of CHF (congestive heart failure) ([place]) 01/10/2014  . Hypertensive heart disease 05/25/2013  . CKD (chronic kidney disease) stage 3, GFR 30-59 ml/min 05/25/2013  . Malignant hypertension 05/23/2013  . Acute respiratory failure ([place]) 05/23/2013  . Hypertensive urgency 05/23/2013  . Pulmonary edema 05/23/2013  . Type 2 diabetes mellitus with diabetic nephropathy ([place]) 05/23/2013  . Type 2 diabetes mellitus with hyperosmolar nonketotic hyperglycemia ([place]) 04/13/2013  . Chronic diastolic CHF (congestive heart failure) ([place]) 04/13/2013  . UGI bleed 03/31/2013  . Hypokalemia 08/24/2012  . Diastolic CHF, acute on chronic ([place]) 08/22/2011  . HTN (hypertension), malignant 08/22/2011  . Type II or unspecified type diabetes mellitus without mention of complication, not stated as uncontrolled 12/27/2007  . HLD (hyperlipidemia) 12/27/2007  . Essential hypertension 12/27/2007  . ALLERGIC RHINITIS 12/27/2007  . Asthma 12/27/2007   Past Medical History:  Diagnosis Date  . Abdominal bruit   . Anemia   . Anxiety   . Arthritis    Osteoarthritis  . Asthma   . Cervical disc disease    "pinced nerve"  . CHF (congestive heart failure) ([name])   . Complication of anesthesia    " after I got home from my last procedure, I started itching."  . Diabetes mellitus    insulin dependent  . Diverticulitis   . ESRD on peritoneal dialysis [facility])    Hemodialysis - MWF  . GERD (gastroesophageal reflux disease)    from medications  . GI bleed 03/31/2013  . Hyperlipidemia   . Hypertension   . Osteoporosis   . Pneumonia   . Seasonal allergies   . Shortness of breath dyspnea    WIth exertion  . Sleep apnea    can't afford cpap       Medication List    TAKE these medications   albuterol 108 (90 Base) MCG/ACT inhaler Commonly known as:  PROVENTIL HFA;VENTOLIN  HFA Inhale 1-2 puffs into the lungs every 6 (six) hours as needed for wheezing or shortness of breath.   amLODipine 10 MG tablet Commonly known as:  NORVASC Take 10 mg by mouth at bedtime.   aspirin 81 MG chewable tablet Chew  81 mg by mouth every evening.   atorvastatin 20 MG tablet Commonly known as:  LIPITOR Take 20 mg by mouth at bedtime.   AURYXIA 1 GM 210 MG(Fe) Tabs Generic drug:  Ferric Citrate Take 630 mg by mouth 3 (three) times daily with meals.   budesonide-formoterol 160-4.5 MCG/ACT inhaler Commonly known as:  SYMBICORT Inhale 1 puff into the lungs every evening.   carvedilol 12.5 MG tablet Commonly known as:  COREG Take 2 tablets (25 mg total) by mouth 2 (two) times daily with a meal.   cinacalcet 30 MG tablet Commonly known as:  SENSIPAR Take 30 mg by mouth every evening.   clopidogrel 75 MG tablet Commonly known as:  PLAVIX Take 1 tablet (75 mg total) by mouth daily.   insulin aspart 100 UNIT/ML injection Commonly known as:  novoLOG Inject 0-8 Units into the skin 3 (three) times daily before meals. Sliding scale   isosorbide-hydrALAZINE 20-37.5 MG tablet Commonly known as:  BIDIL Take 1 tablet by mouth 2 (two) times daily.   LEVEMIR FLEXTOUCH 100 UNIT/ML Pen Generic drug:  Insulin Detemir Inject 10 Units into the muscle at bedtime.   loratadine 10 MG tablet Commonly known as:  CLARITIN Take 10 mg by mouth daily as needed for allergies.   multivitamin Tabs tablet Take 1 tablet by mouth every evening.   mupirocin ointment 2 % Commonly known as:  BACTROBAN Apply 1 application topically 2 (two) times daily.   omeprazole 20 MG capsule Commonly known as:  PRILOSEC Take 20 mg by mouth daily.   traMADol 50 MG tablet Commonly known as:  ULTRAM Take 1 tablet (50 mg total) by mouth every 6 (six) hours as needed. What changed:  reasons to take this   Vitamin D (Ergocalciferol) 50000 units Caps capsule Commonly known as:  DRISDOL Take 50,000 Units  by mouth every Monday. Mondays       Prescriptions given: None given.  Instructions: 1.  No driving for 24 hours and while taking pain medication 2.  No heavy lifting x 2 weeks 3.  Shower daily starting 06/24/16  Disposition: home  Patient's condition: is Good  Follow up: 1. Dr. Donnetta Hutching in 1 week to discuss results and options. 2. Dr. Sharol Given July 02, 2016   Leontine Locket, PA-C [facility] [phone] 06/24/2016  8:25 AM

## 2016-06-24 NOTE — Progress Notes (Signed)
  Progress Note    06/24/2016 8:11 AM 1 Day Post-Op  Subjective:  Says she is not having any pain in her foot and is better than before.  Afebrile HR 70's-80's NSR 779'T-903'E systolic 092% RA  Vitals:   06/23/16 2248 06/24/16 0319  BP: (!) 127/49 (!) 102/43  Pulse: 82 79  Resp: 18 16  Temp: 98.4 F (36.9 C) 97.8 F (36.6 C)    Physical Exam: Cardiac:  regular Lungs:  Non labored Incisions:  Bilateral groins are soft without hematoma Extremities:  + doppler signals left DP/peroneal and right PT; minimal swelling.  Bandage in place left great toe   CBC    Component Value Date/Time   WBC 6.6 06/24/2016 0413   RBC 3.13 (L) 06/24/2016 0413   HGB 8.9 (L) 06/24/2016 0413   HCT 28.2 (L) 06/24/2016 0413   PLT 257 06/24/2016 0413   MCV 90.1 06/24/2016 0413   MCH 28.4 06/24/2016 0413   MCHC 31.6 06/24/2016 0413   RDW 15.1 06/24/2016 0413   LYMPHSABS 2.5 10/12/2015 1600   MONOABS 0.9 10/12/2015 1600   EOSABS 0.2 10/12/2015 1600   BASOSABS 0.0 10/12/2015 1600    BMET    Component Value Date/Time   NA 131 (L) 06/24/2016 0413   K 4.3 06/24/2016 0413   CL 95 (L) 06/24/2016 0413   CO2 23 06/24/2016 0413   GLUCOSE 62 (L) 06/24/2016 0413   BUN 30 (H) 06/24/2016 0413   CREATININE 5.65 (H) 06/24/2016 0413   CALCIUM 8.4 (L) 06/24/2016 0413   GFRNONAA 7 (L) 06/24/2016 0413   GFRAA 8 (L) 06/24/2016 0413    INR    Component Value Date/Time   INR 1.02 10/06/2015 2348    No intake or output data in the 24 hours ending 06/24/16 0811   Assessment:  65 y.o. female is s/p:              1.  Ultrasound-guided access, right femoral artery             2.  Ultrasound-guided access, left femoral artery             3.  Abdominal aortogram             4.  Bilateral lower extremity runoff             5.  Stent, left common iliac artery             6.  Stent, left external iliac artery             7.  Conscious sedation (83 minutes)  1 Day Post-Op   Plan: -pt with +  doppler signals left DP/peroneal and right PT -continue dressing changes with dial soaks as she has been doing bid -discharge home today and she will see Dr. Donnetta Hutching next week to discuss further options.  The office will call her to arrange her appointment.    Leontine Locket, PA-C [facility] [phone] 06/24/2016 8:11 AM

## 2016-06-24 NOTE — Progress Notes (Signed)
2mg  of Morphine was given to Destiny Day for severe leg pain. 2mg  of Morphine was wasted in sink and verified/witnessed by Chip Anderson-RN. Due to extenuating circumstances beyond our control, drug waste 2mg  Morphine was not documented in Pyxis at the same time.

## 2016-06-25 ENCOUNTER — Ambulatory Visit (HOSPITAL_COMMUNITY)
Admission: RE | Admit: 2016-06-25 | Discharge: 2016-06-25 | Disposition: A | Discharge status: Home / Self Care | Payer: Medicare Other | Source: Ambulatory Visit | Attending: Vascular Surgery | Admitting: Vascular Surgery

## 2016-06-25 DIAGNOSIS — I739 Peripheral vascular disease, unspecified: Secondary | ICD-10-CM | POA: Insufficient documentation

## 2016-06-25 DIAGNOSIS — Z992 Dependence on renal dialysis: Secondary | ICD-10-CM | POA: Diagnosis not present

## 2016-06-25 DIAGNOSIS — Z01810 Encounter for preprocedural cardiovascular examination: Secondary | ICD-10-CM | POA: Diagnosis not present

## 2016-06-25 DIAGNOSIS — I159 Secondary hypertension, unspecified: Secondary | ICD-10-CM | POA: Diagnosis not present

## 2016-06-25 DIAGNOSIS — N186 End stage renal disease: Secondary | ICD-10-CM | POA: Diagnosis not present

## 2016-06-25 LAB — HEMOGLOBIN A1C
Hgb A1c MFr Bld: 7.5 % — ABNORMAL HIGH (ref 4.8–5.6)
Mean Plasma Glucose: 169 mg/dL

## 2016-06-26 DIAGNOSIS — E1122 Type 2 diabetes mellitus with diabetic chronic kidney disease: Secondary | ICD-10-CM | POA: Diagnosis not present

## 2016-06-26 DIAGNOSIS — L03032 Cellulitis of left toe: Secondary | ICD-10-CM | POA: Diagnosis not present

## 2016-06-26 DIAGNOSIS — D631 Anemia in chronic kidney disease: Secondary | ICD-10-CM | POA: Diagnosis not present

## 2016-06-26 DIAGNOSIS — N186 End stage renal disease: Secondary | ICD-10-CM | POA: Diagnosis not present

## 2016-06-26 DIAGNOSIS — N2581 Secondary hyperparathyroidism of renal origin: Secondary | ICD-10-CM | POA: Diagnosis not present

## 2016-06-26 DIAGNOSIS — D509 Iron deficiency anemia, unspecified: Secondary | ICD-10-CM | POA: Diagnosis not present

## 2016-06-29 ENCOUNTER — Encounter: Payer: Self-pay | Admitting: Vascular Surgery

## 2016-06-29 DIAGNOSIS — D509 Iron deficiency anemia, unspecified: Secondary | ICD-10-CM | POA: Diagnosis not present

## 2016-06-29 DIAGNOSIS — N2581 Secondary hyperparathyroidism of renal origin: Secondary | ICD-10-CM | POA: Diagnosis not present

## 2016-06-29 DIAGNOSIS — E1122 Type 2 diabetes mellitus with diabetic chronic kidney disease: Secondary | ICD-10-CM | POA: Diagnosis not present

## 2016-06-29 DIAGNOSIS — L03032 Cellulitis of left toe: Secondary | ICD-10-CM | POA: Diagnosis not present

## 2016-06-29 DIAGNOSIS — D631 Anemia in chronic kidney disease: Secondary | ICD-10-CM | POA: Diagnosis not present

## 2016-06-29 DIAGNOSIS — N186 End stage renal disease: Secondary | ICD-10-CM | POA: Diagnosis not present

## 2016-06-30 ENCOUNTER — Other Ambulatory Visit: Payer: Self-pay

## 2016-06-30 ENCOUNTER — Encounter: Payer: Self-pay | Admitting: Vascular Surgery

## 2016-06-30 ENCOUNTER — Ambulatory Visit (INDEPENDENT_AMBULATORY_CARE_PROVIDER_SITE_OTHER): Payer: Medicare Other | Admitting: Vascular Surgery

## 2016-06-30 VITALS — BP 90/48 | HR 85 | Temp 98.3°F | Resp 18 | Ht 65.0 in | Wt 144.0 lb

## 2016-06-30 DIAGNOSIS — I739 Peripheral vascular disease, unspecified: Secondary | ICD-10-CM | POA: Diagnosis not present

## 2016-06-30 DIAGNOSIS — I70245 Atherosclerosis of native arteries of left leg with ulceration of other part of foot: Secondary | ICD-10-CM

## 2016-06-30 MED ORDER — OXYCODONE-ACETAMINOPHEN 5-325 MG PO TABS: 1.0000 | ORAL_TABLET | Freq: Three times a day (TID) | ORAL | 0 refills | Status: DC | PRN

## 2016-06-30 NOTE — Progress Notes (Signed)
POST OPERATIVE OFFICE NOTE    CC:  F/u for surgery  HPI:  This is a 65 y.o. female who is s/p angiogram with iliac stent placement in both common and external iliacs.  Since her procedure she has now developed a 4th and 5th digit "kissing " ulcer between her toes on the left.  She is here to discuss further revascularization intervention with Dr. Donnetta Hutching.  Destiny Day is a 65 y.o. female referred by Dr. Sharol Given for evaluation of lower from the arterial insufficiency left leg. She is a very pleasant 65 year old female with end-stage renal disease. She has a long history of insulin-dependent diabetes. She has a ulceration on the distal tip of her left great toe and x-rays showed osteomyelitis. Dr. Sharol Given is seen her for a possible toe amputation and referred her for evaluation of adequate arterial flow. She does have symptoms consistent with calf claudication bilaterally which is been present for a great period of time. She has never had any other tissue loss. Does have pain consistent with rest pain in her left foot as well.   Allergies  Allergen Reactions  . Lisinopril Cough  . Prednisone Other (See Comments)    Excessive fluid buildup    Current Outpatient Prescriptions  Medication Sig Dispense Refill  . albuterol (PROVENTIL HFA;VENTOLIN HFA) 108 (90 BASE) MCG/ACT inhaler Inhale 1-2 puffs into the lungs every 6 (six) hours as needed for wheezing or shortness of breath.    Marland Kitchen amLODipine (NORVASC) 10 MG tablet Take 10 mg by mouth at bedtime.     Marland Kitchen aspirin 81 MG chewable tablet Chew 81 mg by mouth every evening.     Marland Kitchen atorvastatin (LIPITOR) 20 MG tablet Take 20 mg by mouth at bedtime.     . budesonide-formoterol (SYMBICORT) 160-4.5 MCG/ACT inhaler Inhale 1 puff into the lungs every evening.    . carvedilol (COREG) 12.5 MG tablet Take 2 tablets (25 mg total) by mouth 2 (two) times daily with a meal. 120 tablet 0  . CEFOXITIN SODIUM IV Inject into the vein.    . cinacalcet (SENSIPAR) 30 MG  tablet Take 30 mg by mouth every evening.     . clopidogrel (PLAVIX) 75 MG tablet Take 1 tablet (75 mg total) by mouth daily. 30 tablet 11  . Ferric Citrate (AURYXIA) 1 GM 210 MG(Fe) TABS Take 630 mg by mouth 3 (three) times daily with meals.    . insulin aspart (NOVOLOG) 100 UNIT/ML injection Inject 0-8 Units into the skin 3 (three) times daily before meals. Sliding scale    . isosorbide-hydrALAZINE (BIDIL) 20-37.5 MG tablet Take 1 tablet by mouth 2 (two) times daily. 60 tablet 0  . LEVEMIR FLEXTOUCH 100 UNIT/ML Pen Inject 10 Units into the muscle at bedtime.     Marland Kitchen loratadine (CLARITIN) 10 MG tablet Take 10 mg by mouth daily as needed for allergies.    . multivitamin (RENA-VIT) TABS tablet Take 1 tablet by mouth every evening.   5  . mupirocin ointment (BACTROBAN) 2 % Apply 1 application topically 2 (two) times daily. 22 g 6  . omeprazole (PRILOSEC) 20 MG capsule Take 20 mg by mouth daily.    . traMADol (ULTRAM) 50 MG tablet Take 1 tablet (50 mg total) by mouth every 6 (six) hours as needed. (Patient taking differently: Take 50 mg by mouth every 6 (six) hours as needed (for moderate to severe pain.). ) 30 tablet 0  . VANCOMYCIN HCL IV Inject into the vein one time in  dialysis.    . Vitamin D, Ergocalciferol, (DRISDOL) 50000 units CAPS capsule Take 50,000 Units by mouth every Monday. Mondays  3  . oxyCODONE-acetaminophen (PERCOCET/ROXICET) 5-325 MG tablet Take 1 tablet by mouth every 8 (eight) hours as needed. 20 tablet 0   No current facility-administered medications for this visit.      ROS:  See HPI  Physical Exam:  Vitals:   06/30/16 1327  BP: (!) 90/48  Pulse: 85  Resp: 18  Temp: 98.3 F (36.8 C)    Incision:  Right femoral stick site well healed Extremities:  Great toe ulcer unchanged,  4th and 5th digit "kissing " ulcer between her toes on the left.  Darkened skin discoloration of left foot. DATA: Noninvasive studies of her left foot reveal dampened flow. This is inaudible in  the dorsalis pedis her ankle arm index is 0.46 in the posterior tibial with monophasic waveform  Angiogram:   Left Lower Extremity:  Left common femoral profunda femoral artery are patent without significant stenosis.  The superficial femoral artery is occluded.  There is reconstitution of the popliteal artery at the level of the patella.  There is two-Day runoff via the posterior tibial and peroneal artery.  Procedure: Stent, left common iliac artery,  Stent, left external iliac artery  Vein mapping  Left GSV  0.54-0.22  Assessment/Plan:  This is a 65 y.o. female who is s/p: Angiogram with left iliac stent in external and common.    The superficial femoral artery is occluded.  There is reconstitution of the popliteal artery at the level of the patella.  There is two-Day runoff via the posterior tibial and peroneal artery.  Dr. Donnetta Hutching discussed a plan for revascularization of the left LE by performing a fem-popliteal by pass.  The patient is in agreement.  She will be scheduled for next week.  We will ask her to stop her Plavix , but continue her daily Aspirin.  Her rest pain causes her significant pain and she states the tramadol that she was given is not helping her, especially when she is trying to sleep.  We gave her a prescription for oxycodone # 20 1 q 8 prn for pain today in the office.   Destiny Sers EMMA [facility] PA-C [facility] [phone]  Clinic MD:  Pt seen and examined with Dr. Donnetta Hutching  I have examined the patient, reviewed and agree with above. Tinea pedis to have severe rest pain at night and also with hemodialysis. Has developed new ulceration the medial aspect of her toe. Reviewed her arteriogram discussed at length with the patient. She had a very nice result of iliac angioplasty with Dr. Trula Slade on the left iliac system. She has complete occlusion of her superficial femoral artery at its origin and a subtotal occlusion of her deep femoral artery just  past the origin. She does have reconstitution of the popliteal artery with a very nice below-knee popliteal segment and two-Day runoff via the posterior tibial and peroneal arteries. Her vein shows a good caliber throughout its course. I have recommended left femoral to popliteal block bypass with vein for limb salvage. Explained that she is certainly at risk for limb loss if we cannot improve flow to her foot. She wished to proceed since as soon as possible  Curt Jews, MD 06/30/2016 2:31 PM

## 2016-07-01 DIAGNOSIS — E1122 Type 2 diabetes mellitus with diabetic chronic kidney disease: Secondary | ICD-10-CM | POA: Diagnosis not present

## 2016-07-01 DIAGNOSIS — D509 Iron deficiency anemia, unspecified: Secondary | ICD-10-CM | POA: Diagnosis not present

## 2016-07-01 DIAGNOSIS — N2581 Secondary hyperparathyroidism of renal origin: Secondary | ICD-10-CM | POA: Diagnosis not present

## 2016-07-01 DIAGNOSIS — L03032 Cellulitis of left toe: Secondary | ICD-10-CM | POA: Diagnosis not present

## 2016-07-01 DIAGNOSIS — N186 End stage renal disease: Secondary | ICD-10-CM | POA: Diagnosis not present

## 2016-07-01 DIAGNOSIS — D631 Anemia in chronic kidney disease: Secondary | ICD-10-CM | POA: Diagnosis not present

## 2016-07-02 ENCOUNTER — Ambulatory Visit (INDEPENDENT_AMBULATORY_CARE_PROVIDER_SITE_OTHER): Payer: Medicare Other | Admitting: Orthopedic Surgery

## 2016-07-02 VITALS — Ht 65.0 in | Wt 144.0 lb

## 2016-07-02 DIAGNOSIS — I70262 Atherosclerosis of native arteries of extremities with gangrene, left leg: Secondary | ICD-10-CM | POA: Diagnosis not present

## 2016-07-02 NOTE — Progress Notes (Signed)
Office Visit Note   Patient: Destiny Day           Date of Birth: Feb 17, 1951           MRN: [id number] Visit Date: 07/02/2016              Requested by: Velna Hatchet, MD [place], [place] [address] PCP: Velna Hatchet, MD   Assessment & Plan: Visit Diagnoses:  1. Atherosclerosis of native artery of left lower extremity with gangrene [facility])     Plan: Patient just had arterial studies and will be undergoing a vascular reconstruction procedure with Dr. early next week. Anticipate I will follow-up with her in the hospital discussed that she may require a transmetatarsal amputation with good results from her revascularization. She has gangrene which involves the great toe fourth and fifth toes.  Follow-Up Instructions: Return in about 4 weeks (around 07/30/2016).   Orders:  No orders of the defined types were placed in this encounter.  No orders of the defined types were placed in this encounter.     Procedures: No procedures performed   Clinical Data: No additional findings.   Subjective: Chief Complaint  Patient presents with  . Left Foot - Follow-up    06/16/16 stenting with Dr. Ollen Gross    Patient is in the office today for evaluation of left foot. Gangrene of the left great toe and an ulcer has now developed between the 4th and 5th toe. She began receiving abx at dialysis (vanc and ceftazidime) this was started on 06/29/16 on dialysis days. Patient is also sch for additional surgery with vascular. Pt will have a femoral bypass of the LLE later on this month. The foot is painful she is weight bearing with one crutch and a post op shoe. There is a dry dressing applied today although she does advise that she had been applying silvadene ointment to this.    Review of Systems   Objective: Vital Signs: Ht 5\' 5"  (1.651 m)   Wt 144 lb (65.3 kg)   BMI 23.96 kg/m   Physical Exam on examination patient is alert and oriented no adenopathy well-dressed  normal affect normal respiratory effort she does have an antalgic gait uses one crutch. Examination she does not of a palpable dorsalis pedis pulse. She has dry gangrene involving the great toe as well as the fourth and fifth toes of the left foot. There is no ascending cellulitis there is an odor with no purulent drainage.  Ortho Exam  Specialty Comments:  No specialty comments available.  Imaging: No results found.   PMFS History: Patient Active Problem List   Diagnosis Date Noted  . Atherosclerosis of native artery of left lower extremity with gangrene ([place]) 06/23/2016  . Acute pulmonary edema ([place]) 10/07/2015  . GERD (gastroesophageal reflux disease) 10/07/2015  . ARF (acute renal failure) ([place])   . Hypothermia 06/12/2015  . Acute on chronic renal failure ([place]) 06/12/2015  . CHF (congestive heart failure) ([place]) 07/30/2014  . CKD (chronic kidney disease) stage 4, GFR 15-29 ml/min (HCC) 06/27/2014  . Acute exacerbation of CHF (congestive heart failure) ([place]) 01/10/2014  . Hypertensive heart disease 05/25/2013  . CKD (chronic kidney disease) stage 3, GFR 30-59 ml/min 05/25/2013  . Malignant hypertension 05/23/2013  . Acute respiratory failure ([place]) 05/23/2013  . Hypertensive urgency 05/23/2013  . Pulmonary edema 05/23/2013  . Type 2 diabetes mellitus with diabetic nephropathy ([place]) 05/23/2013  . Type 2 diabetes mellitus with hyperosmolar nonketotic hyperglycemia (HCC)  04/13/2013  . Chronic diastolic CHF (congestive heart failure) ([place]) 04/13/2013  . UGI bleed 03/31/2013  . Hypokalemia 08/24/2012  . Diastolic CHF, acute on chronic ([place]) 08/22/2011  . HTN (hypertension), malignant 08/22/2011  . Type II or unspecified type diabetes mellitus without mention of complication, not stated as uncontrolled 12/27/2007  . HLD (hyperlipidemia) 12/27/2007  . Essential hypertension 12/27/2007  . ALLERGIC RHINITIS 12/27/2007  . Asthma 12/27/2007   Past Medical History:  Diagnosis Date  .  Abdominal bruit   . Anemia   . Anxiety   . Arthritis    Osteoarthritis  . Asthma   . Cervical disc disease    "pinced nerve"  . CHF (congestive heart failure) ([place])   . Complication of anesthesia    " after I got home from my last procedure, I started itching."  . Diabetes mellitus    insulin dependent  . Diverticulitis   . ESRD on peritoneal dialysis [facility])    Hemodialysis - MWF  . GERD (gastroesophageal reflux disease)    from medications  . GI bleed 03/31/2013  . Hyperlipidemia   . Hypertension   . Osteoporosis   . Pneumonia   . Seasonal allergies   . Shortness of breath dyspnea    WIth exertion  . Sleep apnea    can't afford cpap    Family History  Problem Relation Age of Onset  . Other Mother     not sure of cause of death  . Diabetes Father   . Pancreatic cancer Maternal Grandmother   . Colon cancer Neg Hx     Past Surgical History:  Procedure Laterality Date  . ABDOMINAL HYSTERECTOMY  1993`  . AV FISTULA PLACEMENT Left 04/21/2016   Procedure: INSERTION OF ARTERIOVENOUS (AV) GORE-TEX GRAFT ARM LEFT;  Surgeon: Elam Dutch, MD;  Location: [place];  Service: Vascular;  Laterality: Left;  . [place] TRANSPOSITION Left 07/10/2014   Procedure: BASCILIC VEIN TRANSPOSITION;  Surgeon: Angelia Mould, MD;  Location: [place];  Service: Vascular;  Laterality: Left;  . BASCILIC VEIN TRANSPOSITION Right 11/08/2014   Procedure: FIRST STAGE BASILIC VEIN TRANSPOSITION;  Surgeon: Angelia Mould, MD;  Location: [place];  Service: Vascular;  Laterality: Right;  . BASCILIC VEIN TRANSPOSITION Right 01/18/2015   Procedure: SECOND STAGE BASILIC VEIN TRANSPOSITION;  Surgeon: Angelia Mould, MD;  Location: [facility] OR;  Service: Vascular;  Laterality: Right;  . ESOPHAGOGASTRODUODENOSCOPY N/A 03/31/2013   Procedure: ESOPHAGOGASTRODUODENOSCOPY (EGD);  Surgeon: Gatha Mayer, MD;  [facility];  Service: Endoscopy;  Laterality: N/A;  . EYE SURGERY     laser surgery   . FISTULOGRAM Left 10/29/2014   Procedure: FISTULOGRAM;  Surgeon: Angelia Mould, MD;  Location: [facility] CATH LAB;  Service: Cardiovascular;  Laterality: Left;  . LIGATION OF ARTERIOVENOUS  FISTULA Left 04/21/2016   Procedure: LIGATION OF ARTERIOVENOUS  FISTULA LEFT ARM;  Surgeon: Elam Dutch, MD;  Location: [place];  Service: Vascular;  Laterality: Left;  . PATCH ANGIOPLASTY Right 01/18/2015   Procedure: BASILIC VEIN PATCH ANGIOPLASTY USING VASCUGUARD PATCH;  Surgeon: Angelia Mould, MD;  Location: [place];  Service: Vascular;  Laterality: Right;  . PERIPHERAL VASCULAR CATHETERIZATION N/A 06/23/2016   Procedure: Abdominal Aortogram w/Lower Extremity;  Surgeon: Serafina Mitchell, MD;  Location: [place] CV LAB;  Service: Cardiovascular;  Laterality: N/A;  . PERIPHERAL VASCULAR CATHETERIZATION  06/23/2016   Procedure: Peripheral Vascular Intervention;  Surgeon: Serafina Mitchell, MD;  Location: [place] CV LAB;  Service: Cardiovascular;;  lt common and external illiac artery   Social History   Occupational History  . Retired    Social History Main Topics  . Smoking status: Former Smoker    Packs/day: 0.35    Years: 40.00    Types: Cigarettes    Quit date: 05/10/2012  . Smokeless tobacco: Never Used  . Alcohol use No  . Drug use:      Comment: marijuana; quit in early 1980's  . Sexual activity: Yes

## 2016-07-03 DIAGNOSIS — D631 Anemia in chronic kidney disease: Secondary | ICD-10-CM | POA: Diagnosis not present

## 2016-07-03 DIAGNOSIS — N2581 Secondary hyperparathyroidism of renal origin: Secondary | ICD-10-CM | POA: Diagnosis not present

## 2016-07-03 DIAGNOSIS — L03032 Cellulitis of left toe: Secondary | ICD-10-CM | POA: Diagnosis not present

## 2016-07-03 DIAGNOSIS — D509 Iron deficiency anemia, unspecified: Secondary | ICD-10-CM | POA: Diagnosis not present

## 2016-07-03 DIAGNOSIS — N186 End stage renal disease: Secondary | ICD-10-CM | POA: Diagnosis not present

## 2016-07-03 DIAGNOSIS — E1122 Type 2 diabetes mellitus with diabetic chronic kidney disease: Secondary | ICD-10-CM | POA: Diagnosis not present

## 2016-07-06 DIAGNOSIS — E1122 Type 2 diabetes mellitus with diabetic chronic kidney disease: Secondary | ICD-10-CM | POA: Diagnosis not present

## 2016-07-06 DIAGNOSIS — N2581 Secondary hyperparathyroidism of renal origin: Secondary | ICD-10-CM | POA: Diagnosis not present

## 2016-07-06 DIAGNOSIS — L03032 Cellulitis of left toe: Secondary | ICD-10-CM | POA: Diagnosis not present

## 2016-07-06 DIAGNOSIS — D631 Anemia in chronic kidney disease: Secondary | ICD-10-CM | POA: Diagnosis not present

## 2016-07-06 DIAGNOSIS — N186 End stage renal disease: Secondary | ICD-10-CM | POA: Diagnosis not present

## 2016-07-06 DIAGNOSIS — D509 Iron deficiency anemia, unspecified: Secondary | ICD-10-CM | POA: Diagnosis not present

## 2016-07-07 ENCOUNTER — Other Ambulatory Visit (HOSPITAL_COMMUNITY): Payer: Self-pay | Admitting: *Deleted

## 2016-07-07 ENCOUNTER — Encounter (HOSPITAL_COMMUNITY)
Admission: RE | Admit: 2016-07-07 | Discharge: 2016-07-07 | Disposition: A | Discharge status: Home / Self Care | Payer: Medicare Other | Source: Ambulatory Visit | Attending: Vascular Surgery | Admitting: Vascular Surgery

## 2016-07-07 ENCOUNTER — Encounter (HOSPITAL_COMMUNITY): Payer: Self-pay

## 2016-07-07 DIAGNOSIS — D631 Anemia in chronic kidney disease: Secondary | ICD-10-CM | POA: Diagnosis not present

## 2016-07-07 DIAGNOSIS — Z01812 Encounter for preprocedural laboratory examination: Secondary | ICD-10-CM | POA: Insufficient documentation

## 2016-07-07 DIAGNOSIS — I70222 Atherosclerosis of native arteries of extremities with rest pain, left leg: Secondary | ICD-10-CM | POA: Insufficient documentation

## 2016-07-07 DIAGNOSIS — N186 End stage renal disease: Secondary | ICD-10-CM | POA: Diagnosis not present

## 2016-07-07 DIAGNOSIS — E1122 Type 2 diabetes mellitus with diabetic chronic kidney disease: Secondary | ICD-10-CM | POA: Diagnosis not present

## 2016-07-07 DIAGNOSIS — L03032 Cellulitis of left toe: Secondary | ICD-10-CM | POA: Diagnosis not present

## 2016-07-07 DIAGNOSIS — D509 Iron deficiency anemia, unspecified: Secondary | ICD-10-CM | POA: Diagnosis not present

## 2016-07-07 DIAGNOSIS — N2581 Secondary hyperparathyroidism of renal origin: Secondary | ICD-10-CM | POA: Diagnosis not present

## 2016-07-07 HISTORY — DX: Personal history of other diseases of the digestive system: Z87.19

## 2016-07-07 LAB — URINALYSIS, ROUTINE W REFLEX MICROSCOPIC
Glucose, UA: NEGATIVE mg/dL
Ketones, ur: 15 mg/dL — AB
Nitrite: NEGATIVE
Protein, ur: >300 mg/dL — AB
Specific Gravity, Urine: >1.03 — ABNORMAL HIGH (ref 1.005–1.030)
pH: 5.5 (ref 5.0–8.0)

## 2016-07-07 LAB — COMPREHENSIVE METABOLIC PANEL
ALT: 14 U/L (ref 14–54)
AST: 21 U/L (ref 15–41)
Albumin: 3.2 g/dL — ABNORMAL LOW (ref 3.5–5.0)
Alkaline Phosphatase: 120 U/L (ref 38–126)
Anion gap: 13 (ref 5–15)
BUN: 16 mg/dL (ref 6–20)
CO2: 26 mmol/L (ref 22–32)
Calcium: 9.5 mg/dL (ref 8.9–10.3)
Chloride: 95 mmol/L — ABNORMAL LOW (ref 101–111)
Creatinine, Ser: 4.2 mg/dL — ABNORMAL HIGH (ref 0.44–1.00)
GFR calc Af Amer: 12 mL/min — ABNORMAL LOW (ref >60)
GFR calc non Af Amer: 10 mL/min — ABNORMAL LOW (ref >60)
Glucose, Bld: 150 mg/dL — ABNORMAL HIGH (ref 65–99)
Potassium: 3.8 mmol/L (ref 3.5–5.1)
Sodium: 134 mmol/L — ABNORMAL LOW (ref 135–145)
Total Bilirubin: 0.3 mg/dL (ref 0.3–1.2)
Total Protein: 7.7 g/dL (ref 6.5–8.1)

## 2016-07-07 LAB — URINALYSIS, MICROSCOPIC (REFLEX)

## 2016-07-07 LAB — CBC
HCT: 31.7 % — ABNORMAL LOW (ref 36.0–46.0)
Hemoglobin: 10.3 g/dL — ABNORMAL LOW (ref 12.0–15.0)
MCH: 29.3 pg (ref 26.0–34.0)
MCHC: 32.5 g/dL (ref 30.0–36.0)
MCV: 90.3 fL (ref 78.0–100.0)
Platelets: 454 10*3/uL — ABNORMAL HIGH (ref 150–400)
RBC: 3.51 MIL/uL — ABNORMAL LOW (ref 3.87–5.11)
RDW: 14.6 % (ref 11.5–15.5)
WBC: 10.9 10*3/uL — ABNORMAL HIGH (ref 4.0–10.5)

## 2016-07-07 LAB — SURGICAL PCR SCREEN
MRSA, PCR: NEGATIVE
Staphylococcus aureus: NEGATIVE

## 2016-07-07 LAB — APTT: aPTT: 36 seconds (ref 24–36)

## 2016-07-07 LAB — PROTIME-INR
INR: 1.03
Prothrombin Time: 13.6 seconds (ref 11.4–15.2)

## 2016-07-07 LAB — GLUCOSE, CAPILLARY: Glucose-Capillary: 116 mg/dL — ABNORMAL HIGH (ref 65–99)

## 2016-07-07 NOTE — Progress Notes (Signed)
Stephanie from Dr. Luther Parody office called and stated that Dr. Donnetta Hutching said it was fine that pt took Plavix last PM.

## 2016-07-07 NOTE — Pre-Procedure Instructions (Signed)
BABETTE STUM  07/07/2016    Your procedure is scheduled on Wednesday, July 08, 2016 at 12:15 PM.   Report to [facility] Entrance "A" Admitting Office at 10:15 AM.   Call this number if you have problems the morning of surgery: [phone]   Remember:  Do not eat food or drink liquids after midnight tonight.  Take these medicines the morning of surgery with A SIP OF WATER: Carvedilol (Coreg),  Isosorbide-Hydralazine (Bidil), Omeprazole (Prilosec), Oxycodone - if needed, Albuterol inhaler - if needed (bring this inhaler with you in the AM)   How to Manage Your Diabetes Before Surgery   Why is it important to control my blood sugar before and after surgery?   Improving blood sugar levels before and after surgery helps healing and can limit problems.  A way of improving blood sugar control is eating a healthy diet by:  - Eating less sugar and carbohydrates  - Increasing activity/exercise  - Talk with your doctor about reaching your blood sugar goals  High blood sugars (greater than 180 mg/dL) can raise your risk of infections and slow down your recovery so you will need to focus on controlling your diabetes during the weeks before surgery.  Make sure that the doctor who takes care of your diabetes knows about your planned surgery including the date and location.  How do I manage my blood sugars before surgery?   Check your blood sugar at least 4 times a day, 2 days before surgery to make sure that they are not too high or low.  Check your blood sugar the morning of your surgery when you wake up and every 2 hours until you get to the Short-Stay unit.  Treat a low blood sugar (less than 70 mg/dL) with 1/2 cup of clear juice (cranberry or apple), 4 glucose tablets, OR glucose gel.  Recheck blood sugar in 15 minutes after treatment (to make sure it is greater than 70 mg/dL).  If blood sugar is not greater than 70 mg/dL on re-check, call [phone] for further  instructions.   Report your blood sugar to the Short-Stay nurse when you get to Short-Stay.  References:  [facility], 2007 "How to Manage your Diabetes Before and After Surgery".  What do I do about my diabetes medications?   THE NIGHT BEFORE SURGERY, take 5 units of Levemir Insulin.   If your CBG is greater than 220 mg/dL, you may take 1/2 of your sliding scale (correction) dose of insulin.   Do not wear jewelry, make-up or nail polish.  Do not wear lotions, powders, or perfumes.  Do not shave 48 hours prior to surgery.    Do not bring valuables to the hospital.  [facility] is not responsible for any belongings or valuables.  Contacts, dentures or bridgework may not be worn into surgery.  Leave your suitcase in the car.  After surgery it may be brought to your room.  For patients admitted to the hospital, discharge time will be determined by your treatment team.  Special instructions:   - Preparing for Surgery  Before surgery, you can play an important role.  Because skin is not sterile, your skin needs to be as free of germs as possible.  You can reduce the number of germs on you skin by washing with CHG (chlorahexidine gluconate) soap before surgery.  CHG is an antiseptic cleaner which kills germs and bonds with the skin to continue killing germs even  after washing.  Please DO NOT use if you have an allergy to CHG or antibacterial soaps.  If your skin becomes reddened/irritated stop using the CHG and inform your nurse when you arrive at Short Stay.  Do not shave (including legs and underarms) for at least 48 hours prior to the first CHG shower.  You may shave your face.  Please follow these instructions carefully:   1.  Shower with CHG Soap the night before surgery and the                                morning of Surgery.  2.  If you choose to wash your hair, wash your hair first as usual with your       normal shampoo.  3.  After you  shampoo, rinse your hair and body thoroughly to remove the                      Shampoo.  4.  Use CHG as you would any other liquid soap.  You can apply chg directly       to the skin and wash gently with scrungie or a clean washcloth.  5.  Apply the CHG Soap to your body ONLY FROM THE NECK DOWN.        Do not use on open wounds or open sores.  Avoid contact with your eyes,       ears, mouth and genitals (private parts).  Wash genitals (private parts)       with your normal soap.  6.  Wash thoroughly, paying special attention to the area where your surgery        will be performed.  7.  Thoroughly rinse your body with warm water from the neck down.  8.  DO NOT shower/wash with your normal soap after using and rinsing off       the CHG Soap.  9.  Pat yourself dry with a clean towel.            10.  Wear clean pajamas.            11.  Place clean sheets on your bed the night of your first shower and do not        sleep with pets.  Day of Surgery  Do not apply any lotions the morning of surgery.  Please wear clean clothes to the hospital.  Please read over the fact sheets that you were given.

## 2016-07-07 NOTE — Progress Notes (Signed)
Pt has hx of CHF, denies any recent chest pain or sob. Pt is diabetic. Last A1C was 7.5 on 06/25/16. Pt states fasting blood sugar is usually around 85. Today's CBG was 116 and pt had not eaten. Pt states her last dose of Plavix was last pm, but then states she didn't take it Friday, Saturday or Sunday. I've called and left message for one of the nurses at [facility] to notify them of pt not stopping 5 days prior to surgery.

## 2016-07-08 ENCOUNTER — Encounter (HOSPITAL_COMMUNITY)
Admission: RE | Disposition: A | Discharge status: Skilled Nursing Facility | Payer: Self-pay | Source: Ambulatory Visit | Attending: Vascular Surgery

## 2016-07-08 ENCOUNTER — Inpatient Hospital Stay (HOSPITAL_COMMUNITY)
Admission: RE | Admit: 2016-07-08 | Discharge: 2016-07-13 | DRG: 252 | Disposition: A | Discharge status: Skilled Nursing Facility | Payer: Medicare Other | Source: Ambulatory Visit | Attending: Vascular Surgery | Admitting: Vascular Surgery

## 2016-07-08 ENCOUNTER — Inpatient Hospital Stay (HOSPITAL_COMMUNITY): Payer: Medicare Other | Admitting: Anesthesiology

## 2016-07-08 ENCOUNTER — Encounter (HOSPITAL_COMMUNITY): Payer: Self-pay | Admitting: *Deleted

## 2016-07-08 DIAGNOSIS — E1122 Type 2 diabetes mellitus with diabetic chronic kidney disease: Secondary | ICD-10-CM | POA: Diagnosis present

## 2016-07-08 DIAGNOSIS — E11621 Type 2 diabetes mellitus with foot ulcer: Secondary | ICD-10-CM | POA: Diagnosis not present

## 2016-07-08 DIAGNOSIS — Z794 Long term (current) use of insulin: Secondary | ICD-10-CM

## 2016-07-08 DIAGNOSIS — D638 Anemia in other chronic diseases classified elsewhere: Secondary | ICD-10-CM | POA: Diagnosis present

## 2016-07-08 DIAGNOSIS — N2581 Secondary hyperparathyroidism of renal origin: Secondary | ICD-10-CM | POA: Diagnosis not present

## 2016-07-08 DIAGNOSIS — E1169 Type 2 diabetes mellitus with other specified complication: Secondary | ICD-10-CM | POA: Diagnosis not present

## 2016-07-08 DIAGNOSIS — Z7951 Long term (current) use of inhaled steroids: Secondary | ICD-10-CM | POA: Diagnosis not present

## 2016-07-08 DIAGNOSIS — K5900 Constipation, unspecified: Secondary | ICD-10-CM | POA: Diagnosis not present

## 2016-07-08 DIAGNOSIS — B353 Tinea pedis: Secondary | ICD-10-CM | POA: Diagnosis present

## 2016-07-08 DIAGNOSIS — Z800 Family history of malignant neoplasm of digestive organs: Secondary | ICD-10-CM

## 2016-07-08 DIAGNOSIS — E1129 Type 2 diabetes mellitus with other diabetic kidney complication: Secondary | ICD-10-CM | POA: Diagnosis not present

## 2016-07-08 DIAGNOSIS — I96 Gangrene, not elsewhere classified: Secondary | ICD-10-CM

## 2016-07-08 DIAGNOSIS — Z833 Family history of diabetes mellitus: Secondary | ICD-10-CM

## 2016-07-08 DIAGNOSIS — I110 Hypertensive heart disease with heart failure: Secondary | ICD-10-CM | POA: Diagnosis not present

## 2016-07-08 DIAGNOSIS — K5903 Drug induced constipation: Secondary | ICD-10-CM | POA: Diagnosis not present

## 2016-07-08 DIAGNOSIS — M79672 Pain in left foot: Secondary | ICD-10-CM | POA: Diagnosis not present

## 2016-07-08 DIAGNOSIS — N186 End stage renal disease: Secondary | ICD-10-CM | POA: Diagnosis present

## 2016-07-08 DIAGNOSIS — E1152 Type 2 diabetes mellitus with diabetic peripheral angiopathy with gangrene: Principal | ICD-10-CM | POA: Diagnosis present

## 2016-07-08 DIAGNOSIS — M810 Age-related osteoporosis without current pathological fracture: Secondary | ICD-10-CM | POA: Diagnosis present

## 2016-07-08 DIAGNOSIS — Z90710 Acquired absence of both cervix and uterus: Secondary | ICD-10-CM

## 2016-07-08 DIAGNOSIS — Z7902 Long term (current) use of antithrombotics/antiplatelets: Secondary | ICD-10-CM

## 2016-07-08 DIAGNOSIS — E871 Hypo-osmolality and hyponatremia: Secondary | ICD-10-CM | POA: Diagnosis not present

## 2016-07-08 DIAGNOSIS — M86672 Other chronic osteomyelitis, left ankle and foot: Secondary | ICD-10-CM | POA: Diagnosis present

## 2016-07-08 DIAGNOSIS — Z79899 Other long term (current) drug therapy: Secondary | ICD-10-CM

## 2016-07-08 DIAGNOSIS — I120 Hypertensive chronic kidney disease with stage 5 chronic kidney disease or end stage renal disease: Secondary | ICD-10-CM | POA: Diagnosis present

## 2016-07-08 DIAGNOSIS — T40605A Adverse effect of unspecified narcotics, initial encounter: Secondary | ICD-10-CM | POA: Diagnosis not present

## 2016-07-08 DIAGNOSIS — G4730 Sleep apnea, unspecified: Secondary | ICD-10-CM | POA: Diagnosis present

## 2016-07-08 DIAGNOSIS — Z7982 Long term (current) use of aspirin: Secondary | ICD-10-CM | POA: Diagnosis not present

## 2016-07-08 DIAGNOSIS — M79606 Pain in leg, unspecified: Secondary | ICD-10-CM | POA: Diagnosis not present

## 2016-07-08 DIAGNOSIS — Z992 Dependence on renal dialysis: Secondary | ICD-10-CM

## 2016-07-08 DIAGNOSIS — M6281 Muscle weakness (generalized): Secondary | ICD-10-CM | POA: Diagnosis not present

## 2016-07-08 DIAGNOSIS — M79605 Pain in left leg: Secondary | ICD-10-CM

## 2016-07-08 DIAGNOSIS — Z48812 Encounter for surgical aftercare following surgery on the circulatory system: Secondary | ICD-10-CM | POA: Diagnosis not present

## 2016-07-08 DIAGNOSIS — D62 Acute posthemorrhagic anemia: Secondary | ICD-10-CM | POA: Diagnosis not present

## 2016-07-08 DIAGNOSIS — J45909 Unspecified asthma, uncomplicated: Secondary | ICD-10-CM | POA: Diagnosis present

## 2016-07-08 DIAGNOSIS — Z87891 Personal history of nicotine dependence: Secondary | ICD-10-CM | POA: Diagnosis not present

## 2016-07-08 DIAGNOSIS — D631 Anemia in chronic kidney disease: Secondary | ICD-10-CM | POA: Diagnosis not present

## 2016-07-08 DIAGNOSIS — I70262 Atherosclerosis of native arteries of extremities with gangrene, left leg: Secondary | ICD-10-CM | POA: Diagnosis not present

## 2016-07-08 DIAGNOSIS — E119 Type 2 diabetes mellitus without complications: Secondary | ICD-10-CM | POA: Diagnosis not present

## 2016-07-08 DIAGNOSIS — R1110 Vomiting, unspecified: Secondary | ICD-10-CM

## 2016-07-08 DIAGNOSIS — R112 Nausea with vomiting, unspecified: Secondary | ICD-10-CM | POA: Diagnosis not present

## 2016-07-08 DIAGNOSIS — Z79891 Long term (current) use of opiate analgesic: Secondary | ICD-10-CM | POA: Diagnosis not present

## 2016-07-08 DIAGNOSIS — I70222 Atherosclerosis of native arteries of extremities with rest pain, left leg: Secondary | ICD-10-CM | POA: Diagnosis not present

## 2016-07-08 DIAGNOSIS — I739 Peripheral vascular disease, unspecified: Secondary | ICD-10-CM | POA: Diagnosis present

## 2016-07-08 DIAGNOSIS — K219 Gastro-esophageal reflux disease without esophagitis: Secondary | ICD-10-CM | POA: Diagnosis present

## 2016-07-08 DIAGNOSIS — R262 Difficulty in walking, not elsewhere classified: Secondary | ICD-10-CM | POA: Diagnosis not present

## 2016-07-08 DIAGNOSIS — E785 Hyperlipidemia, unspecified: Secondary | ICD-10-CM | POA: Diagnosis present

## 2016-07-08 DIAGNOSIS — I5032 Chronic diastolic (congestive) heart failure: Secondary | ICD-10-CM | POA: Diagnosis not present

## 2016-07-08 DIAGNOSIS — E8889 Other specified metabolic disorders: Secondary | ICD-10-CM | POA: Diagnosis present

## 2016-07-08 DIAGNOSIS — R278 Other lack of coordination: Secondary | ICD-10-CM | POA: Diagnosis not present

## 2016-07-08 HISTORY — PX: FEMORAL-POPLITEAL BYPASS GRAFT: SHX937

## 2016-07-08 LAB — POCT I-STAT 4, (NA,K, GLUC, HGB,HCT)
Glucose, Bld: 116 mg/dL — ABNORMAL HIGH (ref 65–99)
HCT: 33 % — ABNORMAL LOW (ref 36.0–46.0)
Hemoglobin: 11.2 g/dL — ABNORMAL LOW (ref 12.0–15.0)
Potassium: 6 mmol/L — ABNORMAL HIGH (ref 3.5–5.1)
Sodium: 131 mmol/L — ABNORMAL LOW (ref 135–145)

## 2016-07-08 LAB — GLUCOSE, CAPILLARY: Glucose-Capillary: 126 mg/dL — ABNORMAL HIGH (ref 65–99)

## 2016-07-08 SURGERY — BYPASS GRAFT FEMORAL-POPLITEAL ARTERY
Anesthesia: General | Site: Leg Lower | Laterality: Left

## 2016-07-08 MED ORDER — CHLORHEXIDINE GLUCONATE CLOTH 2 % EX PADS: 6.0000 | MEDICATED_PAD | Freq: Once | CUTANEOUS | Status: DC

## 2016-07-08 MED ORDER — FLUTICASONE PROPIONATE 50 MCG/ACT NA SUSP: 1.0000 | Freq: Every day | NASAL | Status: DC | PRN

## 2016-07-08 MED ORDER — CEFUROXIME SODIUM 1.5 G IJ SOLR
1.5000 g | INTRAMUSCULAR | Status: AC
  Administered 2016-07-08: 1.5 g via INTRAVENOUS

## 2016-07-08 MED ORDER — ALBUMIN HUMAN 5 % IV SOLN
INTRAVENOUS | Status: DC | PRN
  Administered 2016-07-08 (×2): via INTRAVENOUS

## 2016-07-08 MED ORDER — ONDANSETRON HCL 4 MG/2ML IJ SOLN
INTRAMUSCULAR | Status: DC | PRN
  Administered 2016-07-08: 4 mg via INTRAVENOUS

## 2016-07-08 MED ORDER — PHENOL 1.4 % MT LIQD: 1.0000 | OROMUCOSAL | Status: DC | PRN

## 2016-07-08 MED ORDER — SODIUM CHLORIDE 0.9 % IV SOLN
INTRAVENOUS | Status: DC
  Administered 2016-07-08 (×4): via INTRAVENOUS

## 2016-07-08 MED ORDER — ATORVASTATIN CALCIUM 20 MG PO TABS
20.0000 mg | ORAL_TABLET | Freq: Every day | ORAL | Status: DC
  Administered 2016-07-08 – 2016-07-12 (×5): 20 mg via ORAL
  Filled 2016-07-08 (×5): qty 1

## 2016-07-08 MED ORDER — HEPARIN SODIUM (PORCINE) 5000 UNIT/ML IJ SOLN
INTRAMUSCULAR | Status: DC | PRN
  Administered 2016-07-08: 14:00:00

## 2016-07-08 MED ORDER — FENTANYL CITRATE (PF) 100 MCG/2ML IJ SOLN
INTRAMUSCULAR | Status: DC | PRN
Start: 2016-07-08 — End: 2016-07-08
  Administered 2016-07-08 (×2): 25 ug via INTRAVENOUS
  Administered 2016-07-08 (×2): 50 ug via INTRAVENOUS

## 2016-07-08 MED ORDER — GUAIFENESIN-DM 100-10 MG/5ML PO SYRP: 15.0000 mL | ORAL_SOLUTION | ORAL | Status: DC | PRN

## 2016-07-08 MED ORDER — DEXTROSE 5 % IV SOLN: INTRAVENOUS | Status: DC | PRN

## 2016-07-08 MED ORDER — HYDROMORPHONE HCL 1 MG/ML IJ SOLN
INTRAMUSCULAR | Status: AC
  Filled 2016-07-08: qty 0.5

## 2016-07-08 MED ORDER — PHENYLEPHRINE HCL 10 MG/ML IJ SOLN
INTRAVENOUS | Status: DC | PRN
  Administered 2016-07-08: 20 ug/min via INTRAVENOUS

## 2016-07-08 MED ORDER — MUPIROCIN 2 % EX OINT
1.0000 "application " | TOPICAL_OINTMENT | Freq: Two times a day (BID) | CUTANEOUS | Status: DC
  Administered 2016-07-08 – 2016-07-13 (×10): 1 via TOPICAL
  Filled 2016-07-08 (×5): qty 22

## 2016-07-08 MED ORDER — HEPARIN SODIUM (PORCINE) 1000 UNIT/ML IJ SOLN
INTRAMUSCULAR | Status: DC | PRN
  Administered 2016-07-08: 7000 [IU] via INTRAVENOUS

## 2016-07-08 MED ORDER — BISACODYL 10 MG RE SUPP: 10.0000 mg | Freq: Every day | RECTAL | Status: DC | PRN

## 2016-07-08 MED ORDER — CEFUROXIME SODIUM 1.5 G IJ SOLR
INTRAMUSCULAR | Status: AC
  Filled 2016-07-08: qty 1.5

## 2016-07-08 MED ORDER — OXYCODONE-ACETAMINOPHEN 5-325 MG PO TABS: 1.0000 | ORAL_TABLET | Freq: Four times a day (QID) | ORAL | Status: DC | PRN

## 2016-07-08 MED ORDER — PANTOPRAZOLE SODIUM 40 MG PO TBEC
40.0000 mg | DELAYED_RELEASE_TABLET | Freq: Every day | ORAL | Status: DC
  Administered 2016-07-09 – 2016-07-13 (×4): 40 mg via ORAL
  Filled 2016-07-08 (×5): qty 1

## 2016-07-08 MED ORDER — CARVEDILOL 25 MG PO TABS
25.0000 mg | ORAL_TABLET | Freq: Two times a day (BID) | ORAL | Status: DC
  Administered 2016-07-09 – 2016-07-12 (×5): 25 mg via ORAL
  Filled 2016-07-08 (×7): qty 1

## 2016-07-08 MED ORDER — DEXTROSE 5 % IV SOLN
1.5000 g | Freq: Two times a day (BID) | INTRAVENOUS | Status: AC
  Administered 2016-07-09 (×2): 1.5 g via INTRAVENOUS
  Filled 2016-07-08 (×3): qty 1.5

## 2016-07-08 MED ORDER — EPHEDRINE 5 MG/ML INJ
INTRAVENOUS | Status: AC
  Filled 2016-07-08: qty 10

## 2016-07-08 MED ORDER — MIDAZOLAM HCL 5 MG/5ML IJ SOLN
INTRAMUSCULAR | Status: DC | PRN
  Administered 2016-07-08: 2 mg via INTRAVENOUS

## 2016-07-08 MED ORDER — PHENYLEPHRINE 40 MCG/ML (10ML) SYRINGE FOR IV PUSH (FOR BLOOD PRESSURE SUPPORT)
PREFILLED_SYRINGE | INTRAVENOUS | Status: AC
  Filled 2016-07-08: qty 10

## 2016-07-08 MED ORDER — PROTAMINE SULFATE 10 MG/ML IV SOLN
INTRAVENOUS | Status: DC | PRN
  Administered 2016-07-08 (×2): 50 mg via INTRAVENOUS

## 2016-07-08 MED ORDER — PROPOFOL 10 MG/ML IV BOLUS
INTRAVENOUS | Status: DC | PRN
  Administered 2016-07-08: 100 mg via INTRAVENOUS
  Administered 2016-07-08 (×2): 50 mg via INTRAVENOUS

## 2016-07-08 MED ORDER — PROTAMINE SULFATE 10 MG/ML IV SOLN
INTRAVENOUS | Status: AC
  Filled 2016-07-08: qty 5

## 2016-07-08 MED ORDER — ACETAMINOPHEN 325 MG PO TABS: 325.0000 mg | ORAL_TABLET | ORAL | Status: DC | PRN

## 2016-07-08 MED ORDER — HEPARIN SODIUM (PORCINE) 1000 UNIT/ML IJ SOLN
INTRAMUSCULAR | Status: AC
  Filled 2016-07-08: qty 1

## 2016-07-08 MED ORDER — MIDAZOLAM HCL 2 MG/2ML IJ SOLN
INTRAMUSCULAR | Status: AC
  Filled 2016-07-08: qty 2

## 2016-07-08 MED ORDER — HEMOSTATIC AGENTS (NO CHARGE) OPTIME
TOPICAL | Status: DC | PRN
  Administered 2016-07-08: 1 via TOPICAL

## 2016-07-08 MED ORDER — ACETAMINOPHEN 325 MG RE SUPP: 325.0000 mg | RECTAL | Status: DC | PRN

## 2016-07-08 MED ORDER — ROCURONIUM BROMIDE 100 MG/10ML IV SOLN
INTRAVENOUS | Status: DC | PRN
  Administered 2016-07-08: 50 mg via INTRAVENOUS

## 2016-07-08 MED ORDER — INSULIN DETEMIR 100 UNIT/ML ~~LOC~~ SOLN
10.0000 [IU] | Freq: Every day | SUBCUTANEOUS | Status: DC
  Administered 2016-07-09 – 2016-07-12 (×3): 10 [IU] via SUBCUTANEOUS
  Filled 2016-07-08 (×5): qty 0.1

## 2016-07-08 MED ORDER — 0.9 % SODIUM CHLORIDE (POUR BTL) OPTIME
TOPICAL | Status: DC | PRN
  Administered 2016-07-08: 2000 mL

## 2016-07-08 MED ORDER — LORATADINE 10 MG PO TABS: 10.0000 mg | ORAL_TABLET | Freq: Every day | ORAL | Status: DC | PRN

## 2016-07-08 MED ORDER — PROMETHAZINE HCL 25 MG/ML IJ SOLN: 6.2500 mg | INTRAMUSCULAR | Status: DC | PRN

## 2016-07-08 MED ORDER — SODIUM CHLORIDE 0.9 % IV SOLN: 500.0000 mL | Freq: Once | INTRAVENOUS | Status: DC | PRN

## 2016-07-08 MED ORDER — DOCUSATE SODIUM 100 MG PO CAPS
100.0000 mg | ORAL_CAPSULE | Freq: Every day | ORAL | Status: DC
  Administered 2016-07-09 – 2016-07-13 (×4): 100 mg via ORAL
  Filled 2016-07-08 (×5): qty 1

## 2016-07-08 MED ORDER — HYDROMORPHONE HCL 1 MG/ML IJ SOLN
0.2500 mg | INTRAMUSCULAR | Status: DC | PRN
  Administered 2016-07-08 (×2): 0.5 mg via INTRAVENOUS

## 2016-07-08 MED ORDER — PHENYLEPHRINE HCL 10 MG/ML IJ SOLN
INTRAMUSCULAR | Status: DC | PRN
  Administered 2016-07-08 (×3): 40 ug via INTRAVENOUS
  Administered 2016-07-08: 80 ug via INTRAVENOUS
  Administered 2016-07-08: 40 ug via INTRAVENOUS

## 2016-07-08 MED ORDER — FERRIC CITRATE 1 GM 210 MG(FE) PO TABS
630.0000 mg | ORAL_TABLET | Freq: Three times a day (TID) | ORAL | Status: DC
  Administered 2016-07-09 – 2016-07-13 (×9): 630 mg via ORAL
  Filled 2016-07-08 (×16): qty 3

## 2016-07-08 MED ORDER — SCOPOLAMINE 1 MG/3DAYS TD PT72: 1.0000 | MEDICATED_PATCH | TRANSDERMAL | Status: DC

## 2016-07-08 MED ORDER — FENTANYL CITRATE (PF) 100 MCG/2ML IJ SOLN
INTRAMUSCULAR | Status: AC
  Filled 2016-07-08: qty 2

## 2016-07-08 MED ORDER — RENA-VITE PO TABS
1.0000 | ORAL_TABLET | Freq: Every day | ORAL | Status: DC
  Administered 2016-07-08 – 2016-07-12 (×5): 1 via ORAL
  Filled 2016-07-08 (×5): qty 1

## 2016-07-08 MED ORDER — EPHEDRINE SULFATE 50 MG/ML IJ SOLN
INTRAMUSCULAR | Status: DC | PRN
  Administered 2016-07-08 (×3): 10 mg via INTRAVENOUS

## 2016-07-08 MED ORDER — ISOSORB DINITRATE-HYDRALAZINE 20-37.5 MG PO TABS
1.0000 | ORAL_TABLET | Freq: Two times a day (BID) | ORAL | Status: DC
  Administered 2016-07-09 – 2016-07-13 (×7): 1 via ORAL
  Filled 2016-07-08 (×9): qty 1

## 2016-07-08 MED ORDER — ASPIRIN 81 MG PO CHEW
81.0000 mg | CHEWABLE_TABLET | Freq: Every evening | ORAL | Status: DC
  Administered 2016-07-10 – 2016-07-12 (×3): 81 mg via ORAL
  Filled 2016-07-08 (×3): qty 1

## 2016-07-08 MED ORDER — MORPHINE SULFATE (PF) 2 MG/ML IV SOLN
2.0000 mg | INTRAVENOUS | Status: DC | PRN
  Administered 2016-07-09 (×3): 2 mg via INTRAVENOUS
  Filled 2016-07-08 (×3): qty 1

## 2016-07-08 MED ORDER — HYDRALAZINE HCL 20 MG/ML IJ SOLN
5.0000 mg | INTRAMUSCULAR | Status: DC | PRN
  Administered 2016-07-10: 5 mg via INTRAVENOUS
  Filled 2016-07-08: qty 1

## 2016-07-08 MED ORDER — POLYETHYLENE GLYCOL 3350 17 G PO PACK
17.0000 g | PACK | Freq: Every day | ORAL | Status: DC | PRN
  Administered 2016-07-12: 17 g via ORAL
  Filled 2016-07-08: qty 1

## 2016-07-08 MED ORDER — ONDANSETRON HCL 4 MG/2ML IJ SOLN
4.0000 mg | Freq: Four times a day (QID) | INTRAMUSCULAR | Status: DC | PRN
  Administered 2016-07-09 – 2016-07-12 (×6): 4 mg via INTRAVENOUS
  Filled 2016-07-08 (×6): qty 2

## 2016-07-08 MED ORDER — AMLODIPINE BESYLATE 10 MG PO TABS
10.0000 mg | ORAL_TABLET | Freq: Every day | ORAL | Status: DC
  Administered 2016-07-10 – 2016-07-12 (×3): 10 mg via ORAL
  Filled 2016-07-08 (×4): qty 1

## 2016-07-08 MED ORDER — LIDOCAINE HCL (CARDIAC) 20 MG/ML IV SOLN
INTRAVENOUS | Status: DC | PRN
  Administered 2016-07-08: 100 mg via INTRAVENOUS

## 2016-07-08 MED ORDER — IOPAMIDOL (ISOVUE-300) INJECTION 61%
INTRAVENOUS | Status: AC
  Filled 2016-07-08: qty 50

## 2016-07-08 MED ORDER — CINACALCET HCL 30 MG PO TABS
30.0000 mg | ORAL_TABLET | Freq: Every day | ORAL | Status: DC
  Administered 2016-07-10 – 2016-07-12 (×3): 30 mg via ORAL
  Filled 2016-07-08 (×4): qty 1

## 2016-07-08 MED ORDER — ALBUTEROL SULFATE (2.5 MG/3ML) 0.083% IN NEBU: 3.0000 mL | INHALATION_SOLUTION | Freq: Four times a day (QID) | RESPIRATORY_TRACT | Status: DC | PRN

## 2016-07-08 MED ORDER — METOPROLOL TARTRATE 5 MG/5ML IV SOLN: 2.0000 mg | INTRAVENOUS | Status: DC | PRN

## 2016-07-08 MED ORDER — PROPOFOL 10 MG/ML IV BOLUS
INTRAVENOUS | Status: AC
Start: 2016-07-08 — End: 2016-07-08
  Filled 2016-07-08: qty 20

## 2016-07-08 MED ORDER — LABETALOL HCL 5 MG/ML IV SOLN: 10.0000 mg | INTRAVENOUS | Status: DC | PRN

## 2016-07-08 MED ORDER — CLOPIDOGREL BISULFATE 75 MG PO TABS
75.0000 mg | ORAL_TABLET | Freq: Every evening | ORAL | Status: DC
  Administered 2016-07-10 – 2016-07-12 (×3): 75 mg via ORAL
  Filled 2016-07-08 (×3): qty 1

## 2016-07-08 SURGICAL SUPPLY — 58 items
BANDAGE ESMARK 6X9 LF (GAUZE/BANDAGES/DRESSINGS) IMPLANT
BNDG ESMARK 6X9 LF (GAUZE/BANDAGES/DRESSINGS)
CANISTER SUCTION 2500CC (MISCELLANEOUS) ×2 IMPLANT
CANNULA VESSEL 3MM 2 BLNT TIP (CANNULA) ×4 IMPLANT
CLIP LIGATING EXTRA MED SLVR (CLIP) ×2 IMPLANT
CLIP LIGATING EXTRA SM BLUE (MISCELLANEOUS) ×4 IMPLANT
CUFF TOURNIQUET SINGLE 34IN LL (TOURNIQUET CUFF) IMPLANT
CUFF TOURNIQUET SINGLE 44IN (TOURNIQUET CUFF) IMPLANT
DERMABOND ADVANCED (GAUZE/BANDAGES/DRESSINGS) ×5
DERMABOND ADVANCED .7 DNX12 (GAUZE/BANDAGES/DRESSINGS) ×5 IMPLANT
DRAIN SNY 10X20 3/4 PERF (WOUND CARE) IMPLANT
DRAPE PROXIMA HALF (DRAPES) IMPLANT
DRAPE X-RAY CASS 24X20 (DRAPES) IMPLANT
DRSG COVADERM 4X10 (GAUZE/BANDAGES/DRESSINGS) IMPLANT
DRSG COVADERM 4X8 (GAUZE/BANDAGES/DRESSINGS) IMPLANT
ELECT REM PT RETURN 9FT ADLT (ELECTROSURGICAL) ×2
ELECTRODE REM PT RTRN 9FT ADLT (ELECTROSURGICAL) ×1 IMPLANT
EVACUATOR SILICONE 100CC (DRAIN) IMPLANT
GAUZE SPONGE 4X4 12PLY STRL (GAUZE/BANDAGES/DRESSINGS) ×2 IMPLANT
GLOVE BIO SURGEON STRL SZ 6.5 (GLOVE) ×4 IMPLANT
GLOVE BIOGEL PI IND STRL 7.0 (GLOVE) ×1 IMPLANT
GLOVE BIOGEL PI IND STRL 7.5 (GLOVE) ×1 IMPLANT
GLOVE BIOGEL PI INDICATOR 7.0 (GLOVE) ×1
GLOVE BIOGEL PI INDICATOR 7.5 (GLOVE) ×1
GLOVE ECLIPSE 7.0 STRL STRAW (GLOVE) ×2 IMPLANT
GLOVE INDICATOR 7.5 STRL GRN (GLOVE) ×4 IMPLANT
GLOVE SS BIOGEL STRL SZ 7.5 (GLOVE) ×1 IMPLANT
GLOVE SUPERSENSE BIOGEL SZ 7.5 (GLOVE) ×1
GLOVE SURG SS PI 7.0 STRL IVOR (GLOVE) IMPLANT
GOWN STRL REUS W/ TWL LRG LVL3 (GOWN DISPOSABLE) ×6 IMPLANT
GOWN STRL REUS W/TWL LRG LVL3 (GOWN DISPOSABLE) ×6
GRAFT PROPATEN W/RING 6X80X60 (Vascular Products) ×2 IMPLANT
HEMOSTAT SNOW SURGICEL 2X4 (HEMOSTASIS) ×2 IMPLANT
INSERT FOGARTY SM (MISCELLANEOUS) ×2 IMPLANT
KIT BASIN OR (CUSTOM PROCEDURE TRAY) ×2 IMPLANT
KIT ROOM TURNOVER OR (KITS) ×2 IMPLANT
NS IRRIG 1000ML POUR BTL (IV SOLUTION) ×4 IMPLANT
PACK PERIPHERAL VASCULAR (CUSTOM PROCEDURE TRAY) ×2 IMPLANT
PAD ARMBOARD 7.5X6 YLW CONV (MISCELLANEOUS) ×4 IMPLANT
PADDING CAST COTTON 6X4 STRL (CAST SUPPLIES) ×2 IMPLANT
SET COLLECT BLD 21X3/4 12 (NEEDLE) IMPLANT
STAPLER VISISTAT 35W (STAPLE) IMPLANT
STOPCOCK 4 WAY LG BORE MALE ST (IV SETS) IMPLANT
SUT ETHILON 3 0 PS 1 (SUTURE) IMPLANT
SUT PROLENE 5 0 C 1 24 (SUTURE) ×6 IMPLANT
SUT PROLENE 6 0 CC (SUTURE) ×14 IMPLANT
SUT SILK 2 0 SH (SUTURE) ×2 IMPLANT
SUT SILK 3 0 (SUTURE) ×1
SUT SILK 3-0 18XBRD TIE 12 (SUTURE) ×1 IMPLANT
SUT SILK 4 0 (SUTURE) ×2
SUT SILK 4-0 18XBRD TIE 12 (SUTURE) ×2 IMPLANT
SUT VIC AB 2-0 CTX 36 (SUTURE) ×4 IMPLANT
SUT VIC AB 3-0 SH 27 (SUTURE) ×7
SUT VIC AB 3-0 SH 27X BRD (SUTURE) ×7 IMPLANT
TRAY FOLEY W/METER SILVER 16FR (SET/KITS/TRAYS/PACK) IMPLANT
TUBING EXTENTION W/L.L. (IV SETS) IMPLANT
UNDERPAD 30X30 (UNDERPADS AND DIAPERS) ×2 IMPLANT
WATER STERILE IRR 1000ML POUR (IV SOLUTION) ×2 IMPLANT

## 2016-07-08 NOTE — H&P (View-Only) (Signed)
POST OPERATIVE OFFICE NOTE    CC:  F/u for surgery  HPI:  This is a 65 y.o. female who is s/p angiogram with iliac stent placement in both common and external iliacs.  Since her procedure she has now developed a 4th and 5th digit "kissing " ulcer between her toes on the left.  She is here to discuss further revascularization intervention with Dr. Donnetta Hutching.  Destiny Day is a 65 y.o. female referred by Dr. Sharol Given for evaluation of lower from the arterial insufficiency left leg. She is a very pleasant 65 year old female with end-stage renal disease. She has a long history of insulin-dependent diabetes. She has a ulceration on the distal tip of her left great toe and x-rays showed osteomyelitis. Dr. Sharol Given is seen her for a possible toe amputation and referred her for evaluation of adequate arterial flow. She does have symptoms consistent with calf claudication bilaterally which is been present for a great period of time. She has never had any other tissue loss. Does have pain consistent with rest pain in her left foot as well.   Allergies  Allergen Reactions  . Lisinopril Cough  . Prednisone Other (See Comments)    Excessive fluid buildup    Current Outpatient Prescriptions  Medication Sig Dispense Refill  . albuterol (PROVENTIL HFA;VENTOLIN HFA) 108 (90 BASE) MCG/ACT inhaler Inhale 1-2 puffs into the lungs every 6 (six) hours as needed for wheezing or shortness of breath.    Marland Kitchen amLODipine (NORVASC) 10 MG tablet Take 10 mg by mouth at bedtime.     Marland Kitchen aspirin 81 MG chewable tablet Chew 81 mg by mouth every evening.     Marland Kitchen atorvastatin (LIPITOR) 20 MG tablet Take 20 mg by mouth at bedtime.     . budesonide-formoterol (SYMBICORT) 160-4.5 MCG/ACT inhaler Inhale 1 puff into the lungs every evening.    . carvedilol (COREG) 12.5 MG tablet Take 2 tablets (25 mg total) by mouth 2 (two) times daily with a meal. 120 tablet 0  . CEFOXITIN SODIUM IV Inject into the vein.    . cinacalcet (SENSIPAR) 30 MG  tablet Take 30 mg by mouth every evening.     . clopidogrel (PLAVIX) 75 MG tablet Take 1 tablet (75 mg total) by mouth daily. 30 tablet 11  . Ferric Citrate (AURYXIA) 1 GM 210 MG(Fe) TABS Take 630 mg by mouth 3 (three) times daily with meals.    . insulin aspart (NOVOLOG) 100 UNIT/ML injection Inject 0-8 Units into the skin 3 (three) times daily before meals. Sliding scale    . isosorbide-hydrALAZINE (BIDIL) 20-37.5 MG tablet Take 1 tablet by mouth 2 (two) times daily. 60 tablet 0  . LEVEMIR FLEXTOUCH 100 UNIT/ML Pen Inject 10 Units into the muscle at bedtime.     Marland Kitchen loratadine (CLARITIN) 10 MG tablet Take 10 mg by mouth daily as needed for allergies.    . multivitamin (RENA-VIT) TABS tablet Take 1 tablet by mouth every evening.   5  . mupirocin ointment (BACTROBAN) 2 % Apply 1 application topically 2 (two) times daily. 22 g 6  . omeprazole (PRILOSEC) 20 MG capsule Take 20 mg by mouth daily.    . traMADol (ULTRAM) 50 MG tablet Take 1 tablet (50 mg total) by mouth every 6 (six) hours as needed. (Patient taking differently: Take 50 mg by mouth every 6 (six) hours as needed (for moderate to severe pain.). ) 30 tablet 0  . VANCOMYCIN HCL IV Inject into the vein one time in  dialysis.    . Vitamin D, Ergocalciferol, (DRISDOL) 50000 units CAPS capsule Take 50,000 Units by mouth every Monday. Mondays  3  . oxyCODONE-acetaminophen (PERCOCET/ROXICET) 5-325 MG tablet Take 1 tablet by mouth every 8 (eight) hours as needed. 20 tablet 0   No current facility-administered medications for this visit.      ROS:  See HPI  Physical Exam:  Vitals:   06/30/16 1327  BP: (!) 90/48  Pulse: 85  Resp: 18  Temp: 98.3 F (36.8 C)    Incision:  Right femoral stick site well healed Extremities:  Great toe ulcer unchanged,  4th and 5th digit "kissing " ulcer between her toes on the left.  Darkened skin discoloration of left foot. DATA: Noninvasive studies of her left foot reveal dampened flow. This is inaudible in  the dorsalis pedis her ankle arm index is 0.46 in the posterior tibial with monophasic waveform  Angiogram:   Left Lower Extremity:  Left common femoral profunda femoral artery are patent without significant stenosis.  The superficial femoral artery is occluded.  There is reconstitution of the popliteal artery at the level of the patella.  There is two-vessel runoff via the posterior tibial and peroneal artery.  Procedure: Stent, left common iliac artery,  Stent, left external iliac artery  Vein mapping  Left GSV  0.54-0.22  Assessment/Plan:  This is a 65 y.o. female who is s/p: Angiogram with left iliac stent in external and common.    The superficial femoral artery is occluded.  There is reconstitution of the popliteal artery at the level of the patella.  There is two-vessel runoff via the posterior tibial and peroneal artery.  Dr. Donnetta Hutching discussed a plan for revascularization of the left LE by performing a fem-popliteal by pass.  The patient is in agreement.  She will be scheduled for next week.  We will ask her to stop her Plavix , but continue her daily Aspirin.  Her rest pain causes her significant pain and she states the tramadol that she was given is not helping her, especially when she is trying to sleep.  We gave her a prescription for oxycodone # 20 1 q 8 prn for pain today in the office.   Destiny Day Destiny Day [facility] PA-C [facility] [phone]  Clinic MD:  Pt seen and examined with Dr. Donnetta Hutching  I have examined the patient, reviewed and agree with above. Tinea pedis to have severe rest pain at night and also with hemodialysis. Has developed new ulceration the medial aspect of her toe. Reviewed her arteriogram discussed at length with the patient. She had a very nice result of iliac angioplasty with Dr. Trula Slade on the left iliac system. She has complete occlusion of her superficial femoral artery at its origin and a subtotal occlusion of her deep femoral artery just  past the origin. She does have reconstitution of the popliteal artery with a very nice below-knee popliteal segment and two-vessel runoff via the posterior tibial and peroneal arteries. Her vein shows a good caliber throughout its course. I have recommended left femoral to popliteal block bypass with vein for limb salvage. Explained that she is certainly at risk for limb loss if we cannot improve flow to her foot. She wished to proceed since as soon as possible  Curt Jews, MD 06/30/2016 2:31 PM

## 2016-07-08 NOTE — Interval H&P Note (Signed)
History and Physical Interval Note:  07/08/2016 11:00 AM  Destiny Day  has presented today for surgery, with the diagnosis of Peripheral vascular disease with left lower extremity rest pain I70.222  The various methods of treatment have been discussed with the patient and family. After consideration of risks, benefits and other options for treatment, the patient has consented to  Procedure(s): [place] (Left) as a surgical intervention .  The patient's history has been reviewed, patient examined, no change in status, stable for surgery.  I have reviewed the patient's chart and labs.  Questions were answered to the patient's satisfaction.     Curt Jews

## 2016-07-08 NOTE — Anesthesia Postprocedure Evaluation (Signed)
Anesthesia Post Note  Patient: Destiny Day  Procedure(s) Performed: Procedure(s) (LRB): LEFT  FEMORAL-BELOW KNEE POPLITEAL ARTERY BYPASS GRAFT USING 6MM X 80 CM PROPATEN GORETEX GRAFT WITH RINGS. (Left)  Patient location during evaluation: PACU Anesthesia Type: General Level of consciousness: awake, awake and alert and oriented Pain management: pain level controlled Vital Signs Assessment: post-procedure vital signs reviewed and stable Respiratory status: spontaneous breathing, nonlabored ventilation and respiratory function stable Cardiovascular status: blood pressure returned to baseline Anesthetic complications: no    Last Vitals:  Vitals:   07/08/16 1945 07/08/16 2000  BP: (!) 102/44 (!) 97/44  Pulse: 81 82  Resp: 10 10  Temp:      Last Pain:  Vitals:   07/08/16 1845  TempSrc:   PainSc: 9                  Auren Valdes COKER

## 2016-07-08 NOTE — Anesthesia Procedure Notes (Signed)
Procedure Name: Intubation Date/Time: 07/08/2016 1:36 PM Performed by: Scheryl Darter Pre-anesthesia Checklist: Patient identified, Emergency Drugs available, Suction available and Patient being monitored Patient Re-evaluated:Patient Re-evaluated prior to inductionOxygen Delivery Method: Circle System Utilized Preoxygenation: Pre-oxygenation with 100% oxygen Intubation Type: IV induction Ventilation: Mask ventilation without difficulty Laryngoscope Size: Mac and 3 Grade View: Grade II Tube type: Oral Tube size: 7.5 mm Number of attempts: 1 Airway Equipment and Method: Stylet and Oral airway Placement Confirmation: ETT inserted through vocal cords under direct vision,  positive ETCO2 and breath sounds checked- equal and bilateral Secured at: 22 cm Tube secured with: Tape Dental Injury: Teeth and Oropharynx as per pre-operative assessment

## 2016-07-08 NOTE — Op Note (Signed)
OPERATIVE REPORT  DATE OF SURGERY: 07/08/2016  PATIENT: Destiny Day, 65 y.o. female MRN: [id number]  DOB: 05/27/1951  PRE-OPERATIVE DIAGNOSIS: Critical limb ischemia left leg with gangrenous changes left foot  POST-OPERATIVE DIAGNOSIS:  Same  PROCEDURE: Left femoral to below-knee popliteal bypass with 6 mm probe patent Gore-Tex graft  SURGEON:  Curt Jews, M.D.  PHYSICIAN ASSISTANT: Gerri Lins PAC, Samantha Rhyne PA-C  ANESTHESIA:  Gen.  EBL: 200 ml  Total I/O In: 7564 [I.V.:1150; IV Piggyback:500] Out: 200 [Blood:200]  BLOOD ADMINISTERED: None  DRAINS: None  SPECIMEN: None  COUNTS CORRECT:  YES  PLAN OF CARE: PACU   PATIENT DISPOSITION:  PACU - hemodynamically stable  PROCEDURE DETAILS: Patient was taken up replacing most of area of the left groin left leg were prepped and draped in usual sterile fashion. SonoSite ultrasound was used to visualize the saphenous vein. The patient had a branching just past the saphenofemoral junction with an anterior branch and a posterior branch. These appear to be relatively equal size. The anterior branch was initially unroofed through on multiple skin bridges and became small and unusable above the level of the knee. The saphenous vein was exposed through the below-knee area and of the posterior branch was harvested from the level of the calf to the groin. The vein was small in caliber. Multiple trigger branches were ligated with 3 or 4 silk ties and divided. The vein was gently dilated and was approximately 2-1/2 mm in size. Felt of the vein was too small to use a valvulotome inserted decision was made to use this in a reversed fashion. The below-knee popliteal artery was exposed through the vein harvest incision from the medial approach. The artery was very calcified. Tunnel was created between the level of the below-knee popliteal artery to the groin. The patient was given 7000 units intravenous heparin. After adequate  circulation time the common superficial femoral and profundus femoris arteries were occluded. The common femoral artery was opened with an 11 blade this and lost any with Potts scissors. The artery was very calcified but did have a widely patent lumen. The vein was brought onto the field and was removed spatulated and reversed. The vein was sewn end-to-side to the artery with a running 6-0 Prolene suture. 2 half dilator passed through the vein at the proximal anastomosis. The anastomosis was completed and clamps removed from the artery. There is very poor flow through the vein graft. I confirm that there was no twisting or no other technical problems regarding this. The vein was very small throughout its course. For this reason the decision was made to remove the vein and the band and the vein graft in place a prosthetic graft. The common superficial femoral and profundus reverse arteries were reoccluded. The vein was removed from the artery. The 6 mm probe patent Gore-Tex ringed graft was brought onto the field and was spatulated and sewn end-to-side to the artery with a running 5-0 Prolene suture. This anastomosis was tested and found to be adequate. The graft was brought to the prior created tunnel down to the level of the below-knee popliteal artery. A pneumatic tourniquet was placed in the above-knee position and the leg was exsanguinated with an Esmarch tourniquet. The pneumatic tourniquet was inflated to 250 mmHg. The below-knee popliteal artery was opened with 11 blade and sent ulcerative Potts scissors. The vein was extremely calcified but did have a good lumen. The knee was straightened and the graft was cut to appropriate length  and spatulated and sewn end-to-side to the artery with a running 5-0 Prolene suture. Prior to completion of the anastomosis the tourniquet was deflated and the usual flushing maneuvers were undertaken. Anastomosis completed and flow was restored. There was excellent graft  dependent Doppler flow in the popliteal artery and at the posterior tibial artery. The patient was given 100 mg of protamine to reverse heparin. Wounds irrigated with saline. Hemostasis tablet cautery. Wounds were closed with a 3-0 Vicryl in the vein harvest incisions. The groin and popliteal incisions were closed with 2-0 Vicryl in the fascia and 3-0 Vicryl in the subcuticular tissue. She'll dressing was applied and the patient was transferred to the recovery room stable condition   Destiny Day, M.D., [facility] 07/08/2016 6:05 PM

## 2016-07-08 NOTE — Anesthesia Postprocedure Evaluation (Signed)
Anesthesia Post Note  Patient: Destiny Day  Procedure(s) Performed: Procedure(s) (LRB): LEFT  FEMORAL-BELOW KNEE POPLITEAL ARTERY BYPASS GRAFT USING 6MM X 80 CM PROPATEN GORETEX GRAFT WITH RINGS. (Left)  Patient location during evaluation: PACU Anesthesia Type: General Level of consciousness: awake Pain management: pain level controlled Vital Signs Assessment: post-procedure vital signs reviewed and stable Respiratory status: spontaneous breathing Cardiovascular status: stable Anesthetic complications: no    Last Vitals:  Vitals:   07/08/16 1104 07/08/16 1815  BP: (!) 179/73   Pulse:    Resp:    Temp:  36.8 C    Last Pain:  Vitals:   07/08/16 1815  TempSrc:   PainSc: 10-Worst pain ever                 Annalese Stiner

## 2016-07-08 NOTE — Anesthesia Preprocedure Evaluation (Addendum)
Anesthesia Evaluation  Patient identified by MRN, date of birth, ID band Patient awake    Reviewed: Allergy & Precautions, NPO status , Patient's Chart, lab work & pertinent test results  History of Anesthesia Complications Negative for: history of anesthetic complications  Airway Mallampati: II  TM Distance: >3 FB Neck ROM: Full    Dental  (+) Edentulous Upper, Dental Advisory Given   Pulmonary asthma , sleep apnea , former smoker,    breath sounds clear to auscultation       Cardiovascular hypertension, + Peripheral Vascular Disease   Rhythm:Regular Rate:Normal     Neuro/Psych Anxiety negative neurological ROS     GI/Hepatic Neg liver ROS, hiatal hernia, GERD  ,  Endo/Other  diabetes  Renal/GU Renal InsufficiencyRenal disease     Musculoskeletal   Abdominal   Peds  Hematology   Anesthesia Other Findings   Reproductive/Obstetrics                             Anesthesia Physical  Anesthesia Plan  ASA: III  Anesthesia Plan: General   Post-op Pain Management:    Induction: Intravenous  Airway Management Planned: Oral ETT  Additional Equipment:   Intra-op Plan:   Post-operative Plan: Extubation in OR  Informed Consent: I have reviewed the patients History and Physical, chart, labs and discussed the procedure including the risks, benefits and alternatives for the proposed anesthesia with the patient or authorized representative who has indicated his/her understanding and acceptance.   Dental advisory given  Plan Discussed with: CRNA and Anesthesiologist  Anesthesia Plan Comments:        Anesthesia Quick Evaluation                                   Anesthesia Evaluation  Patient identified by MRN, date of birth, ID band Patient awake    Reviewed: Allergy & Precautions, NPO status , Patient's Chart, lab work & pertinent test results, reviewed documented beta blocker  date and time   History of Anesthesia Complications Negative for: history of anesthetic complications  Airway Mallampati: II  TM Distance: >3 FB Neck ROM: Full    Dental no notable dental hx. (+) Dental Advisory Given, Edentulous Upper, Partial Lower, Poor Dentition   Pulmonary asthma , sleep apnea , pneumonia -, former smoker,  breath sounds clear to auscultation  Pulmonary exam normal       Cardiovascular hypertension, Pt. on medications and Pt. on home beta blockers +CHF Normal cardiovascular examRhythm:Regular Rate:Normal  08/2014 - echo - - Left ventricle: The cavity size was normal. There was moderate concentric hypertrophy. Systolic function was normal. The estimated ejection fraction was in the range of 50% to 55%. Wall motion was normal; there were no regional wall motion abnormalities. Doppler parameters are consistent with arestrictive pattern, indicative of decreased left ventricular diastolic compliance and/or increased left atrial pressure (grade 3 diastolic dysfunction). Doppler parameters are consistent withboth elevated ventricular end-diastolic filling pressure and elevated left atrial filling pressure. - Left atrium: The atrium was mildly dilated. - Atrial septum: No defect or patent foramen ovale was identified.   Neuro/Psych Anxiety    GI/Hepatic GERD-  Medicated,  Endo/Other  diabetes, Type 2, Insulin Dependent  Renal/GU ESRFRenal disease     Musculoskeletal  (+) Arthritis -,   Abdominal   Peds  Hematology  (+) anemia ,   Anesthesia Other Findings  Reproductive/Obstetrics                            Anesthesia Physical  Anesthesia Plan  ASA: III  Anesthesia Plan: General   Post-op Pain Management:    Induction: Intravenous  Airway Management Planned: LMA  Additional Equipment: None  Intra-op Plan:   Post-operative Plan: Extubation in OR  Informed Consent: I have reviewed the patients History  and Physical, chart, labs and discussed the procedure including the risks, benefits and alternatives for the proposed anesthesia with the patient or authorized representative who has indicated his/her understanding and acceptance.   Dental advisory given  Plan Discussed with: CRNA  Anesthesia Plan Comments:         Anesthesia Quick Evaluation

## 2016-07-08 NOTE — Transfer of Care (Signed)
Immediate Anesthesia Transfer of Care Note  Patient: Destiny Day  Procedure(s) Performed: Procedure(s): LEFT  FEMORAL-BELOW KNEE POPLITEAL ARTERY BYPASS GRAFT USING 6MM X 80 CM PROPATEN GORETEX GRAFT WITH RINGS. (Left)  Patient Location: PACU  Anesthesia Type:General  Level of Consciousness: awake, oriented and patient cooperative  Airway & Oxygen Therapy: Patient Spontanous Breathing and Patient connected to nasal cannula oxygen  Post-op Assessment: Report given to RN, Post -op Vital signs reviewed and stable and Patient moving all extremities  Post vital signs: Reviewed and stable  Last Vitals:  Vitals:   07/08/16 1041 07/08/16 1104  BP: (!) 192/72 (!) 179/73  Pulse: 89   Resp: 18   Temp:      Last Pain:  Vitals:   07/08/16 1040  TempSrc: Oral  PainSc: 0-No pain         Complications: No apparent anesthesia complications

## 2016-07-09 ENCOUNTER — Encounter: Payer: Medicare Other | Admitting: Vascular Surgery

## 2016-07-09 ENCOUNTER — Inpatient Hospital Stay (HOSPITAL_COMMUNITY): Payer: Medicare Other

## 2016-07-09 ENCOUNTER — Encounter (HOSPITAL_COMMUNITY): Payer: Self-pay | Admitting: Vascular Surgery

## 2016-07-09 DIAGNOSIS — I739 Peripheral vascular disease, unspecified: Secondary | ICD-10-CM

## 2016-07-09 LAB — BASIC METABOLIC PANEL
Anion gap: 14 (ref 5–15)
BUN: 21 mg/dL — ABNORMAL HIGH (ref 6–20)
CO2: 23 mmol/L (ref 22–32)
Calcium: 8.9 mg/dL (ref 8.9–10.3)
Chloride: 99 mmol/L — ABNORMAL LOW (ref 101–111)
Creatinine, Ser: 4.77 mg/dL — ABNORMAL HIGH (ref 0.44–1.00)
GFR calc Af Amer: 10 mL/min — ABNORMAL LOW (ref >60)
GFR calc non Af Amer: 9 mL/min — ABNORMAL LOW (ref >60)
Glucose, Bld: 162 mg/dL — ABNORMAL HIGH (ref 65–99)
Potassium: 5 mmol/L (ref 3.5–5.1)
Sodium: 136 mmol/L (ref 135–145)

## 2016-07-09 LAB — PREPARE RBC (CROSSMATCH)

## 2016-07-09 LAB — CBC
HCT: 22.9 % — ABNORMAL LOW (ref 36.0–46.0)
Hemoglobin: 7 g/dL — ABNORMAL LOW (ref 12.0–15.0)
MCH: 28.8 pg (ref 26.0–34.0)
MCHC: 30.6 g/dL (ref 30.0–36.0)
MCV: 94.2 fL (ref 78.0–100.0)
Platelets: 312 10*3/uL (ref 150–400)
RBC: 2.43 MIL/uL — ABNORMAL LOW (ref 3.87–5.11)
RDW: 15.3 % (ref 11.5–15.5)
WBC: 13.2 10*3/uL — ABNORMAL HIGH (ref 4.0–10.5)

## 2016-07-09 LAB — GLUCOSE, CAPILLARY
Glucose-Capillary: 143 mg/dL — ABNORMAL HIGH (ref 65–99)
Glucose-Capillary: 148 mg/dL — ABNORMAL HIGH (ref 65–99)
Glucose-Capillary: 165 mg/dL — ABNORMAL HIGH (ref 65–99)

## 2016-07-09 MED ORDER — BISACODYL 10 MG RE SUPP
10.0000 mg | Freq: Every day | RECTAL | Status: DC | PRN
  Administered 2016-07-10: 10 mg via RECTAL
  Filled 2016-07-09: qty 1

## 2016-07-09 MED ORDER — OXYCODONE-ACETAMINOPHEN 5-325 MG PO TABS
1.0000 | ORAL_TABLET | ORAL | Status: DC | PRN
  Administered 2016-07-09: 2 via ORAL
  Administered 2016-07-10 – 2016-07-12 (×3): 1 via ORAL
  Filled 2016-07-09: qty 2
  Filled 2016-07-09: qty 1
  Filled 2016-07-09: qty 2
  Filled 2016-07-09 (×2): qty 1

## 2016-07-09 MED ORDER — SODIUM CHLORIDE 0.9 % IV SOLN: Freq: Once | INTRAVENOUS | Status: AC

## 2016-07-09 MED ORDER — TRAMADOL HCL 50 MG PO TABS
50.0000 mg | ORAL_TABLET | Freq: Two times a day (BID) | ORAL | Status: DC | PRN
  Administered 2016-07-12: 50 mg via ORAL
  Filled 2016-07-09: qty 1

## 2016-07-09 MED ORDER — PROMETHAZINE HCL 25 MG/ML IJ SOLN
12.5000 mg | Freq: Four times a day (QID) | INTRAMUSCULAR | Status: DC | PRN
  Administered 2016-07-09: 12.5 mg via INTRAVENOUS
  Filled 2016-07-09 (×2): qty 1

## 2016-07-09 MED ORDER — SODIUM CHLORIDE 0.9 % IV SOLN: Freq: Once | INTRAVENOUS | Status: DC

## 2016-07-09 MED ORDER — DOXERCALCIFEROL 4 MCG/2ML IV SOLN
4.0000 ug | INTRAVENOUS | Status: DC
  Administered 2016-07-10 – 2016-07-13 (×2): 4 ug via INTRAVENOUS
  Filled 2016-07-09 (×2): qty 2

## 2016-07-09 MED ORDER — OXYCODONE HCL 5 MG PO TABS: 10.0000 mg | ORAL_TABLET | ORAL | Status: DC | PRN

## 2016-07-09 NOTE — Progress Notes (Addendum)
Per shift report from daytime nurse; patient is to receive 2nd unit of packed RBC in dialysis tomorrow via verbal order from MD.

## 2016-07-09 NOTE — Progress Notes (Signed)
PT Cancellation Note  Patient Details Name: Destiny Day MRN: [id number] DOB: 1951-05-30   Cancelled Treatment:    Reason Eval/Treat Not Completed: Patient declined, no reason specified;Medical issues which prohibited therapy Attempted to perform PT evaluation however pt with + emesis x2 in room. Reports not feeling well. RN present. To start blood transfusion soon. Will follow up as time allows.   Marguarite Arbour A Parmvir Boomer 07/09/2016, 10:51 AM Wray Kearns, PT, DPT [phone]

## 2016-07-09 NOTE — Progress Notes (Addendum)
[facility]  Subjective  - Painful left leg incisions, some complaints of nausea.   Objective (!) 118/45 91 98.6 F (37 C) (Oral) 17 100%  Intake/Output Summary (Last 24 hours) at 07/09/16 0743 Last data filed at 07/08/16 1758  Gross per 24 hour  Intake             1650 ml  Output              200 ml  Net             1450 ml    Doppler PT/DP/peroneal signals Incisions clean and dry, no hematoma left groin Heart RRR Lungs non labored breathing O2   Assessment/Planning: POD # 1  PROCEDURE: Left femoral to below-knee popliteal bypass with 6 mm probe patent Gore-Tex graft Nausea treated with Zofran, tolerating ice chips HGB 7.0 will transfuse 2 units PRBC PT for mobility Transfer to 2 W after transfusion  Laurence Slate [facility] 07/09/2016 7:43 AM --  Laboratory Lab Results:  Recent Labs  07/07/16 0915 07/08/16 1054 07/09/16 0639  WBC 10.9*  --  13.2*  HGB 10.3* 11.2* 7.0*  HCT 31.7* 33.0* 22.9*  PLT 454*  --  312   BMET  Recent Labs  07/07/16 0915 07/08/16 1054 07/09/16 0501  NA 134* 131* 136  K 3.8 6.0* 5.0  CL 95*  --  99*  CO2 26  --  23  GLUCOSE 150* 116* 162*  BUN 16  --  21*  CREATININE 4.20*  --  4.77*  CALCIUM 9.5  --  8.9    COAG Lab Results  Component Value Date   INR 1.03 07/07/2016   INR 1.02 10/06/2015   INR 1.05 06/13/2015   No results found for: PTT  I have examined the patient, reviewed and agree with above.Incisional soreness. Does have a palpable posterior tibial pulse. All incisions are healing without hematoma. Will encourage her use of the pain medication and explained the importance of mobilization. Will ask Dr. Sharol Given to see. Okay for treatment of toe infection as needed  Curt Jews, MD 07/09/2016 8:52 AM

## 2016-07-09 NOTE — Progress Notes (Signed)
VASCULAR LAB PRELIMINARY  ARTERIAL  ABI completed: Unable to obtain left brachial pressure due to arteriovenous fistula. 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 Unable to obtain TBI's due to toe ulceration and patient movement. Left sided femoral to popliteal artery bypass graft was placed on 07/08/16. Compared to the previous ABI obtained on 06/08/16, right ABI has diminished from 0.56. Left ABI has improved from 0.46.   RIGHT    LEFT    PRESSURE WAVEFORM  PRESSURE WAVEFORM  BRACHIAL 140 Biphasic BRACHIAL AVF   DP 48 Monophasic DP 88 Monophasic  PT 43 Monophasic PT 95 Monophasic    RIGHT LEFT  ABI 0.34 0.68     Legrand Como, RVT 07/09/2016, 2:38 PM

## 2016-07-09 NOTE — Progress Notes (Signed)
OT Cancellation Note  Patient Details Name: Destiny Day MRN: [id number] DOB: 24-Nov-1950   Cancelled Treatment:    Reason Eval/Treat Not Completed: Medical issues which prohibited therapy. Pt with hgb of 7.0 and orders to transfuse blood. Will follow.  [name] 07/09/2016, 8:30 AM  [phone]

## 2016-07-09 NOTE — Consult Note (Signed)
[facility] Renal Consultation Note    Indication for Consultation:  Management of ESRD/hemodialysis; anemia, hypertension/volume and secondary hyperparathyroidism PCP: Dr. Eugenia Pancoast  HPI: Destiny Day is a 65 y.o. female with ESRD 2/2 HTN on hemodialysis since 10/11/2015. PMH significant for HTN, DM, HF, PVD, GIB, GERD,diverticulitis, HLD, arthritis, anemia of chronic disease,SHPT. Recent angiogram with iliac stent placement in both common and external iliacs arteries. She developed "kissing ulcers" between 4th and 5th toes L foot post procedure.   Patient was admitted for critical limb ischemia/L great toe gangrene/osteomylitis, ulcers between 4th and 5th toes. She had scheduled L femoral to below knee popliteal bypass grafting 07/08/16. Did well in surgery, has had issues with pain, nausea post op. HGB has dropped to 7.0 from HGB 10 07/07/16. 2 units of PRBCs have been ordered. She is to receive one today and one tomorrow with HD. Patient seen in SDU, is awake, alert, oriented. She C/O pain in L leg along incision lines, states she has had some nausea, otherwise says she is doing OK. Denies chest pain, SOB, fever, chills, diarrhea, abdominal pain, says she has had no other issues.    Patient started hemodialysis in Spring, 2017. She attends HD regularly, leaves within acceptable range of EDW. She has issues with binder compliance/hyperphosphatemia, otherwise a very compliant HD pt.   Past Medical History:  Diagnosis Date  . Abdominal bruit   . Anemia   . Anxiety   . Arthritis    Osteoarthritis  . Asthma   . Cervical disc disease    "pinced nerve"  . CHF (congestive heart failure) ([place])   . Complication of anesthesia    " after I got home from my last procedure, I started itching."  . Diabetes mellitus    insulin dependent  . Diverticulitis   . ESRD on peritoneal dialysis [facility])    Hemodialysis - MWF  . GERD (gastroesophageal reflux disease)    from medications  .  GI bleed 03/31/2013  . History of hiatal hernia   . Hyperlipidemia   . Hypertension   . Osteoporosis   . Pneumonia   . Seasonal allergies   . Shortness of breath dyspnea    WIth exertion  . Sleep apnea    can't afford cpap   Past Surgical History:  Procedure Laterality Date  . ABDOMINAL HYSTERECTOMY  1993`  . AV FISTULA PLACEMENT Left 04/21/2016   Procedure: INSERTION OF ARTERIOVENOUS (AV) GORE-TEX GRAFT ARM LEFT;  Surgeon: Elam Dutch, MD;  Location: [place];  Service: Vascular;  Laterality: Left;  . [place] TRANSPOSITION Left 07/10/2014   Procedure: BASCILIC VEIN TRANSPOSITION;  Surgeon: Angelia Mould, MD;  Location: [place];  Service: Vascular;  Laterality: Left;  . BASCILIC VEIN TRANSPOSITION Right 11/08/2014   Procedure: FIRST STAGE BASILIC VEIN TRANSPOSITION;  Surgeon: Angelia Mould, MD;  Location: [place];  Service: Vascular;  Laterality: Right;  . BASCILIC VEIN TRANSPOSITION Right 01/18/2015   Procedure: SECOND STAGE BASILIC VEIN TRANSPOSITION;  Surgeon: Angelia Mould, MD;  Location[facility] OR;  Service: Vascular;  Laterality: Right;  . ESOPHAGOGASTRODUODENOSCOPY N/A 03/31/2013   Procedure: ESOPHAGOGASTRODUODENOSCOPY (EGD);  Surgeon: Gatha Mayer, MD;  Location: [facility] ENDOSCOPY;  Service: Endoscopy;  Laterality: N/A;  . EYE SURGERY     laser surgery  . FEMORAL-POPLITEAL BYPASS GRAFT Left 07/08/2016   Procedure: LEFT  FEMORAL-BELOW KNEE POPLITEAL ARTERY BYPASS GRAFT USING 6MM X 80 CM PROPATEN GORETEX GRAFT WITH RINGS.;  Surgeon: Rosetta Posner, MD;  Location: [facility] OR;  Service: Vascular;  Laterality: Left;  . FISTULOGRAM Left 10/29/2014   Procedure: FISTULOGRAM;  Surgeon: Angelia Mould, MD;  Location: [facility] CATH LAB;  Service: Cardiovascular;  Laterality: Left;  . LIGATION OF ARTERIOVENOUS  FISTULA Left 04/21/2016   Procedure: LIGATION OF ARTERIOVENOUS  FISTULA LEFT ARM;  Surgeon: Elam Dutch, MD;  Location: [place];  Service: Vascular;  Laterality: Left;   . PATCH ANGIOPLASTY Right 01/18/2015   Procedure: BASILIC VEIN PATCH ANGIOPLASTY USING VASCUGUARD PATCH;  Surgeon: Angelia Mould, MD;  Location: [place];  Service: Vascular;  Laterality: Right;  . PERIPHERAL VASCULAR CATHETERIZATION N/A 06/23/2016   Procedure: Abdominal Aortogram w/Lower Extremity;  Surgeon: Serafina Mitchell, MD;  Location: [place] CV LAB;  Service: Cardiovascular;  Laterality: N/A;  . PERIPHERAL VASCULAR CATHETERIZATION  06/23/2016   Procedure: Peripheral Vascular Intervention;  Surgeon: Serafina Mitchell, MD;  Location: [place] CV LAB;  Service: Cardiovascular;;  lt common and external illiac artery   Family History  Problem Relation Age of Onset  . Other Mother     not sure of cause of death  . Diabetes Father   . Pancreatic cancer Maternal Grandmother   . Colon cancer Neg Hx    Social History:  reports that she quit smoking about 4 years ago. Her smoking use included Cigarettes. She has a 14.00 pack-year smoking history. She has never used smokeless tobacco. She reports that she uses drugs. She reports that she does not drink alcohol. Allergies  Allergen Reactions  . Lisinopril Cough  . Prednisone Other (See Comments)    Excessive fluid buildup   Prior to Admission medications   Medication Sig Start Date End Date Taking? Authorizing Provider  amLODipine (NORVASC) 10 MG tablet Take 10 mg by mouth at bedtime.    Yes Historical Provider, MD  aspirin 81 MG chewable tablet Chew 81 mg by mouth every evening.    Yes Historical Provider, MD  atorvastatin (LIPITOR) 20 MG tablet Take 20 mg by mouth at bedtime.    Yes Historical Provider, MD  budesonide-formoterol (SYMBICORT) 160-4.5 MCG/ACT inhaler Inhale 1 puff into the lungs every evening.   Yes Historical Provider, MD  carvedilol (COREG) 12.5 MG tablet Take 2 tablets (25 mg total) by mouth 2 (two) times daily with a meal. 07/03/15  Yes Jonetta Osgood, MD  CEFOXITIN SODIUM IV Inject into the vein.   Yes  Historical Provider, MD  cinacalcet (SENSIPAR) 30 MG tablet Take 30 mg by mouth every evening.    Yes Historical Provider, MD  clopidogrel (PLAVIX) 75 MG tablet Take 1 tablet (75 mg total) by mouth daily. Patient taking differently: Take 75 mg by mouth every evening.  06/23/16  Yes Serafina Mitchell, MD  Ferric Citrate (AURYXIA) 1 GM 210 MG(Fe) TABS Take 630 mg by mouth 3 (three) times daily with meals.   Yes Historical Provider, MD  fluticasone (FLONASE) 50 MCG/ACT nasal spray Place 1 spray into both nostrils daily as needed for allergies or rhinitis.   Yes Historical Provider, MD  insulin aspart (NOVOLOG) 100 UNIT/ML injection Inject 0-8 Units into the skin 3 (three) times daily before meals. Sliding scale   Yes Historical Provider, MD  isosorbide-hydrALAZINE (BIDIL) 20-37.5 MG tablet Take 1 tablet by mouth 2 (two) times daily. 07/03/15  Yes Shanker Kristeen Mans, MD  LEVEMIR FLEXTOUCH 100 UNIT/ML Pen Inject 10 Units into the muscle at bedtime.  01/16/15  Yes Historical Provider, MD  loratadine (CLARITIN) 10 MG tablet  Take 10 mg by mouth daily as needed for allergies.   Yes Historical Provider, MD  multivitamin (RENA-VIT) TABS tablet Take 1 tablet by mouth every evening.  01/24/15  Yes Historical Provider, MD  mupirocin ointment (BACTROBAN) 2 % Apply 1 application topically 2 (two) times daily. 06/15/16  Yes Chrystie Nose, NP  omeprazole (PRILOSEC) 20 MG capsule Take 20 mg by mouth daily.   Yes Historical Provider, MD  oxyCODONE-acetaminophen (PERCOCET/ROXICET) 5-325 MG tablet Take 1 tablet by mouth every 8 (eight) hours as needed. Patient taking differently: Take 1 tablet by mouth every 8 (eight) hours as needed for moderate pain.  06/30/16  Yes Ulyses Amor, PA-C  VANCOMYCIN HCL IV Inject into the vein one time in dialysis.   Yes Historical Provider, MD  Vitamin D, Ergocalciferol, (DRISDOL) 50000 units CAPS capsule Take 50,000 Units by mouth every 7 (seven) days. Mondays 01/17/16  Yes Historical  Provider, MD  albuterol (PROVENTIL HFA;VENTOLIN HFA) 108 (90 BASE) MCG/ACT inhaler Inhale 1-2 puffs into the lungs every 6 (six) hours as needed for wheezing or shortness of breath.    Historical Provider, MD  traMADol (ULTRAM) 50 MG tablet Take 1 tablet (50 mg total) by mouth every 6 (six) hours as needed. 06/16/16   Rosetta Posner, MD   Current Facility-Administered Medications  Medication Dose Route Frequency Provider Last Rate Last Dose  . 0.9 %  sodium chloride infusion  500 mL Intravenous Once PRN Samantha J Rhyne, PA-C      . 0.9 %  sodium chloride infusion   Intravenous Once Ulyses Amor, PA-C 10 mL/hr at 07/09/16 1300    . acetaminophen (TYLENOL) tablet 325-650 mg  325-650 mg Oral Q4H PRN Samantha J Rhyne, PA-C       Or  . acetaminophen (TYLENOL) suppository 325-650 mg  325-650 mg Rectal Q4H PRN Samantha J Rhyne, PA-C      . albuterol (PROVENTIL) (2.5 MG/3ML) 0.083% nebulizer solution 3 mL  3 mL Inhalation Q6H PRN Samantha J Rhyne, PA-C      . amLODipine (NORVASC) tablet 10 mg  10 mg Oral QHS Samantha J Rhyne, PA-C      . aspirin chewable tablet 81 mg  81 mg Oral QPM Samantha J Rhyne, PA-C      . atorvastatin (LIPITOR) tablet 20 mg  20 mg Oral QHS Samantha J Rhyne, PA-C   20 mg at 07/08/16 2250  . bisacodyl (DULCOLAX) suppository 10 mg  10 mg Rectal Daily PRN Hulen Shouts Rhyne, PA-C      . carvedilol (COREG) tablet 25 mg  25 mg Oral BID WC Samantha J Rhyne, PA-C   25 mg at 07/09/16 0836  . cefUROXime (ZINACEF) 1.5 g in dextrose 5 % 50 mL IVPB  1.5 g Intravenous Q12H Samantha J Rhyne, PA-C   1.5 g at 07/09/16 1502  . cinacalcet (SENSIPAR) tablet 30 mg  30 mg Oral Q supper Samantha J Rhyne, PA-C      . clopidogrel (PLAVIX) tablet 75 mg  75 mg Oral QPM Samantha J Rhyne, PA-C      . docusate sodium (COLACE) capsule 100 mg  100 mg Oral Daily Samantha J Rhyne, PA-C   100 mg at 07/09/16 0829  . ferric citrate (AURYXIA) tablet 630 mg  630 mg Oral TID WC Samantha J Rhyne, PA-C   630 mg at  07/09/16 1019  . fluticasone (FLONASE) 50 MCG/ACT nasal spray 1 spray  1 spray Each Nare Daily PRN Samantha J Rhyne, PA-C      .  guaiFENesin-dextromethorphan (ROBITUSSIN DM) 100-10 MG/5ML syrup 15 mL  15 mL Oral Q4H PRN Samantha J Rhyne, PA-C      . hydrALAZINE (APRESOLINE) injection 5 mg  5 mg Intravenous Q20 Min PRN Samantha J Rhyne, PA-C      . insulin detemir (LEVEMIR) injection 10 Units  10 Units Subcutaneous QHS Samantha J Rhyne, PA-C      . isosorbide-hydrALAZINE (BIDIL) 20-37.5 MG per tablet 1 tablet  1 tablet Oral BID Gabriel Earing, PA-C   1 tablet at 07/09/16 0836  . labetalol (NORMODYNE,TRANDATE) injection 10 mg  10 mg Intravenous Q10 min PRN Samantha J Rhyne, PA-C      . loratadine (CLARITIN) tablet 10 mg  10 mg Oral Daily PRN Samantha J Rhyne, PA-C      . metoprolol (LOPRESSOR) injection 2-5 mg  2-5 mg Intravenous Q2H PRN Samantha J Rhyne, PA-C      . morphine 2 MG/ML injection 2 mg  2 mg Intravenous Q2H PRN Hulen Shouts Rhyne, PA-C   2 mg at 07/09/16 1341  . multivitamin (RENA-VIT) tablet 1 tablet  1 tablet Oral QHS Hulen Shouts Rhyne, PA-C   1 tablet at 07/08/16 2200  . mupirocin ointment (BACTROBAN) 2 % 1 application  1 application Topical BID Gabriel Earing, PA-C   1 application at 78/29/56 0830  . ondansetron (ZOFRAN) injection 4 mg  4 mg Intravenous Q6H PRN Hulen Shouts Rhyne, PA-C   4 mg at 07/09/16 1457  . oxyCODONE-acetaminophen (PERCOCET/ROXICET) 5-325 MG per tablet 1-2 tablet  1-2 tablet Oral Q4H PRN Ulyses Amor, PA-C   2 tablet at 07/09/16 2130  . pantoprazole (PROTONIX) EC tablet 40 mg  40 mg Oral Daily Samantha J Rhyne, PA-C   40 mg at 07/09/16 0830  . phenol (CHLORASEPTIC) mouth spray 1 spray  1 spray Mouth/Throat PRN Samantha J Rhyne, PA-C      . polyethylene glycol (MIRALAX / GLYCOLAX) packet 17 g  17 g Oral Daily PRN Samantha J Rhyne, PA-C      . promethazine (PHENERGAN) injection 12.5 mg  12.5 mg Intravenous Q6H PRN Ulyses Amor, PA-C   12.5 mg at 07/09/16 1238    Labs: Basic Metabolic Panel:  Recent Labs Lab 07/07/16 0915 07/08/16 1054 07/09/16 0501  NA 134* 131* 136  K 3.8 6.0* 5.0  CL 95*  --  99*  CO2 26  --  23  GLUCOSE 150* 116* 162*  BUN 16  --  21*  CREATININE 4.20*  --  4.77*  CALCIUM 9.5  --  8.9   Liver Function Tests:  Recent Labs Lab 07/07/16 0915  AST 21  ALT 14  ALKPHOS 120  BILITOT 0.3  PROT 7.7  ALBUMIN 3.2*   CBC:  Recent Labs Lab 07/07/16 0915 07/08/16 1054 07/09/16 0639  WBC 10.9*  --  13.2*  HGB 10.3* 11.2* 7.0*  HCT 31.7* 33.0* 22.9*  MCV 90.3  --  94.2  PLT 454*  --  312    CBG:  Recent Labs Lab 07/07/16 0818 07/08/16 1809 07/09/16 0001 07/09/16 0847 07/09/16 1206  GLUCAP 116* 126* 148* 143* 165*   IStudies/Results: No results found.  ROS: As per HPI otherwise negative.   Physical Exam: Vitals:   07/09/16 1200 07/09/16 1245 07/09/16 1300 07/09/16 1342  BP: (!) 120/56 (!) 134/52 (!) 129/49   Pulse: 88 95 95 95  Resp:  (!) 21    Temp:  98.1 F (36.7 C) 98.4 F (36.9 C)  TempSrc:  Oral    SpO2: 100% 100% 100% 100%  Weight:      Height:         General: Pleasant, thin female in NAD Head: Normocephalic, atraumatic, sclera non-icteric, mucus membranes are moist Neck: Supple. JVD not elevated. Lungs: Clear bilaterally to auscultation without wheezes, rales, or rhonchi. Breathing is unlabored. Heart: RRR with S1 S2. No murmurs, rubs, or gallops appreciated. Abdomen: Soft, non-tender, non-distended with normoactive bowel sounds. No rebound/guarding. No obvious abdominal masses. Lower extremities: Incision L LE with dermabond. Doppler DP/pedal pulse present. Very tender to touch. Has ulcer on top of L great toe no drainage, ulcers between 4 & 5th toes, dried dark drainage, no odor.  Neuro: Alert and oriented X 3. Moves all extremities spontaneously. Psych:  Responds to questions appropriately with a normal affect. Dialysis Access: LUA AVG + bruit  Dialysis Orders: [facility]  MWF 4 hours 400/800 63.5 kg 3.0 K/2.25 Ca UF Profile 4 Heparin 2500 units IV initial bolus/1000 units IV mid run Mircera 50 mcg IV q 4 weeks (last dose 12/12/201 Last HGB 10) Venofer 50 mg IV weekly (last dose 07/07/16 Fe 29 Tsat 12%. Recent FE load) Hectoral 4 mcg IV q tx (Last PTH 453 05/20/16)  BMD medications: Auryxia 210 mg PO TID w/meals  Sensipar 60 mg PO q day with supper Last Phos 6.4 Ca 9.2 Ca 9.3  Assessment/Plan: 1.  L Critical Limb Ischemia, gangrene/osteo L great toe/S/P L femoral to below knee popliteal bypass grafting. PO day #1. Per [facility]VS 2.  ESRD - MWF [place]. Had HD at center 07/06/16 and 07/07/16. Next treatment tomorrow. K+ 5.0. No heparin.  3.  Hypertension/volume  - BP 124/44 which is lower than usual for pt . Bidil 20-37.5 mg 1 tab PO BID and amlodipine 10 mg PO q day, coreg 12.5 mg PO BID on OP med list. These have not been resumed, Coreg dose presently ordered is higher than OP med list-have asked nurse to call husband for clarification of antihypertensive meds as pt does not remember. Wt today 63.5 kg. She is at OP EDW. Very poor PO intake D/T nausea. Run even tomorrow.  4.  Anemia  - HGB 7.0. Has rec'd 1 unit of PRBCs. Need 2nd unit with HD tomorrow. Recheck HGB prior to HD tomorrow.  5.  Metabolic bone disease -  Cont binders, sensipar when eating. Cont VDRA.  6.  Nutrition -Clear liquids at present, Unable to eat D/T nausea.   Rita H. Owens Shark, NP-C 07/09/2016, 3:03 PM  [employer] [phone]  Pt seen, examined and agree w A/P as above.  Kelly Splinter MD [employer] pager [phone]   07/09/2016, 4:39 PM

## 2016-07-09 NOTE — Progress Notes (Signed)
Pt unable to tolerate a lot of activity today, d/t n/v and pain.  Pain is controlled at this time.  However, pt has vomiited bile numerous times even after zofran.  Instructed pt not to put anything on stomach at this time d/t possibility of illeus.  Notified Gerri Lins with [facility]VS and received order for phenergan.  "Will try phenergan first but if pt continues, order KUB"

## 2016-07-09 NOTE — Care Management Note (Signed)
Case Management Note  Patient Details  Name: Destiny Day MRN: [id number] Date of Birth: 1951/03/23  Subjective/Objective:  S/p fem pop bypass, hgb 7.0 will receive 2 units PRBC.  She has been preset up with [facility] at [place] office for [facility], [place], and [place].  NCM spoke with patient's husband in the room  With patient he states this is fine with them.  Will still await pt/ot eval.  NCM will cont to follow for dc needs.                   Action/Plan:   Expected Discharge Date:  07/11/16               Expected Discharge Plan:  [place]  In-House Referral:     Discharge planning Services  CM Consult  Post Acute Care Choice:  Home Health Choice offered to:  Spouse  DME Arranged:    DME Agency:     HH Arranged:  RN, PT, OT HH Agency:  [place]  Status of Service:  In process, will continue to follow  If discussed at Long Length of Stay Meetings, dates discussed:    Additional Comments:  Zenon Mayo, RN 07/09/2016, 11:15 AM

## 2016-07-09 NOTE — Progress Notes (Signed)
Destiny Fairly, PA from nephrology instructs to ONLY give 1 unit PRBC today and 2nd unit to be given in HD tomorrow.  Changed order in Epic.  Will report thoroughly to oncoming RN

## 2016-07-09 NOTE — Progress Notes (Signed)
Pt back from [place].  Still c/o n/v and vomitted bile again, even after receiving zofran and phenergan earlier this shift.  Gave zofran again at this time.  Ordering KUB per Gerri Lins at this time.

## 2016-07-10 DIAGNOSIS — I96 Gangrene, not elsewhere classified: Secondary | ICD-10-CM

## 2016-07-10 LAB — BASIC METABOLIC PANEL
Anion gap: 17 — ABNORMAL HIGH (ref 5–15)
BUN: 40 mg/dL — ABNORMAL HIGH (ref 6–20)
CO2: 23 mmol/L (ref 22–32)
Calcium: 9.3 mg/dL (ref 8.9–10.3)
Chloride: 96 mmol/L — ABNORMAL LOW (ref 101–111)
Creatinine, Ser: 6.7 mg/dL — ABNORMAL HIGH (ref 0.44–1.00)
GFR calc Af Amer: 7 mL/min — ABNORMAL LOW (ref >60)
GFR calc non Af Amer: 6 mL/min — ABNORMAL LOW (ref >60)
Glucose, Bld: 200 mg/dL — ABNORMAL HIGH (ref 65–99)
Potassium: 5.1 mmol/L (ref 3.5–5.1)
Sodium: 136 mmol/L (ref 135–145)

## 2016-07-10 LAB — CBC
HCT: 27.5 % — ABNORMAL LOW (ref 36.0–46.0)
Hemoglobin: 8.7 g/dL — ABNORMAL LOW (ref 12.0–15.0)
MCH: 29.1 pg (ref 26.0–34.0)
MCHC: 31.6 g/dL (ref 30.0–36.0)
MCV: 92 fL (ref 78.0–100.0)
Platelets: 370 10*3/uL (ref 150–400)
RBC: 2.99 MIL/uL — ABNORMAL LOW (ref 3.87–5.11)
RDW: 15.8 % — ABNORMAL HIGH (ref 11.5–15.5)
WBC: 20.7 10*3/uL — ABNORMAL HIGH (ref 4.0–10.5)

## 2016-07-10 LAB — GLUCOSE, CAPILLARY: Glucose-Capillary: 99 mg/dL (ref 65–99)

## 2016-07-10 MED ORDER — DOXERCALCIFEROL 4 MCG/2ML IV SOLN
INTRAVENOUS | Status: AC
  Administered 2016-07-10: 20:00:00
  Filled 2016-07-10: qty 2

## 2016-07-10 MED ORDER — NEPRO/CARBSTEADY PO LIQD
237.0000 mL | Freq: Two times a day (BID) | ORAL | Status: DC
  Administered 2016-07-11 – 2016-07-12 (×4): 237 mL via ORAL
  Filled 2016-07-10 (×8): qty 237

## 2016-07-10 NOTE — Progress Notes (Signed)
Report called to 2W, pt will transfer to 2W31 via bed and belongings. Waiting for pt to go to dialysis.

## 2016-07-10 NOTE — Progress Notes (Signed)
[facility] Progress Note   Subjective:  Says today was a better day, less pain. No dyspnea. Just getting on dialysis now.  Objective Vitals:   07/10/16 1100 07/10/16 1200 07/10/16 1208 07/10/16 1453  BP: (!) 130/47 123/76 123/76 (!) 135/45  Pulse: 94 98 (!) 110 86  Resp:   18   Temp:   98.1 F (36.7 C) 98 F (36.7 C)  TempSrc:   Oral Oral  SpO2: 99% 100% 98% 95%  Weight:      Height:       Physical Exam General: Well appearing female, NAD. Heart: RRR; no murmur. Lungs: CTA anteriorly, pain with sitting up for posterior exam. Extremities: Bandaged L foot, long intact/dry incision down L leg s/p procedure. Dialysis Access: LUE AVF + thrill  Additional Objective Labs: Basic Metabolic Panel:  Recent Labs Lab 07/07/16 0915 07/08/16 1054 07/09/16 0501 07/10/16 0743  NA 134* 131* 136 136  K 3.8 6.0* 5.0 5.1  CL 95*  --  99* 96*  CO2 26  --  23 23  GLUCOSE 150* 116* 162* 200*  BUN 16  --  21* 40*  CREATININE 4.20*  --  4.77* 6.70*  CALCIUM 9.5  --  8.9 9.3   Liver Function Tests:  Recent Labs Lab 07/07/16 0915  AST 21  ALT 14  ALKPHOS 120  BILITOT 0.3  PROT 7.7  ALBUMIN 3.2*   CBC:  Recent Labs Lab 07/07/16 0915 07/08/16 1054 07/09/16 0639 07/10/16 0743  WBC 10.9*  --  13.2* 20.7*  HGB 10.3* 11.2* 7.0* 8.7*  HCT 31.7* 33.0* 22.9* 27.5*  MCV 90.3  --  94.2 92.0  PLT 454*  --  312 370   CBG:  Recent Labs Lab 07/07/16 0818 07/08/16 1809 07/09/16 0001 07/09/16 0847 07/09/16 1206  GLUCAP 116* 126* 148* 143* 165*   Studies/Results: Dg Abd Portable 1v  Result Date: 07/09/2016 CLINICAL DATA:  Vomiting EXAM: PORTABLE ABDOMEN - 1 VIEW COMPARISON:  None. FINDINGS: Scattered large and small bowel gas is noted. Fecal material is noted within the colon consistent with a mild degree of constipation. No obstructive changes are seen. Aortic calcifications are noted. Left iliac arterial stents are seen. Calcifications are noted in the hila  of the kidneys bilaterally likely related to renal arterial calcifications. No bony abnormality is seen. Degenerative changes of the lumbar spine are noted. IMPRESSION: Mild constipation.  No other focal abnormality is noted. Electronically Signed   By: Inez Catalina M.D.   On: 07/09/2016 15:26   Medications:  . sodium chloride   Intravenous Once  . amLODipine  10 mg Oral QHS  . aspirin  81 mg Oral QPM  . atorvastatin  20 mg Oral QHS  . carvedilol  25 mg Oral BID WC  . cinacalcet  30 mg Oral Q supper  . clopidogrel  75 mg Oral QPM  . docusate sodium  100 mg Oral Daily  . doxercalciferol  4 mcg Intravenous Q M,W,F-HD  . ferric citrate  630 mg Oral TID WC  . insulin detemir  10 Units Subcutaneous QHS  . isosorbide-hydrALAZINE  1 tablet Oral BID  . multivitamin  1 tablet Oral QHS  . mupirocin ointment  1 application Topical BID  . pantoprazole  40 mg Oral Daily    Dialysis Orders: [place] MWF 4 hours 400/800 63.5 kg 3.0 K/2.25 Ca UF Profile 4 Heparin 2500 units IV initial bolus/1000 units IV mid run Mircera 50 mcg IV q 4 weeks (last dose  12/12/201 Last HGB 10) Venofer 50 mg IV weekly (last dose 07/07/16 Fe 29 Tsat 12%. Recent FE load) Hectoral 4 mcg IV q tx (Last PTH 453 05/20/16)  BMD medications: Auryxia 210 mg PO TID w/meals  Sensipar 60 mg PO q day with supper Last Phos 6.4 Ca 9.2 Ca 9.3  Assessment/Plan: 1.  L Critical Limb Ischemia (with gangrene/osteo L great toe): S/p L femoral to below knee popliteal bypass grafting 12/13. VVS following. 2.  ESRD - MWF [place], for HD now. No heparin.  3.  Hypertension/volume: BP ok. Continue meds and EDW. 4.  Anemia: Hgb 8.7, s/p 1U PRBC on 12/14. Monitor. 5.  Metabolic bone disease -  Cont binders, sensipar when eating. Cont VDRA.  6.  Nutrition: Alb 3.2, adding Nepro supplements. 7. DM: On insulin, Per primary.  Veneta Penton, PA-C 07/10/2016, 4:52 PM  [facility] Pager: [phone]  Pt seen, examined and agree w  A/P as above.  Kelly Splinter MD [employer] pager [phone]   07/10/2016, 5:40 PM

## 2016-07-10 NOTE — Progress Notes (Signed)
PRN dose of 5mg  of hydralazine given for BP of 175/56.   07/10/16 0400  Vitals  BP (!) 175/56  MAP (mmHg) 89  BP Location Right Arm  BP Method Automatic  Patient Position (if appropriate) Sitting  Pulse Rate 97  Pulse Rate Source Monitor  ECG Heart Rate 97  Cardiac Rhythm NSR  Oxygen Therapy  SpO2 100 %

## 2016-07-10 NOTE — Progress Notes (Addendum)
[facility]  Subjective  - Nausea is better, tolerating liquids slowly.   Objective (!) 172/58 97 98 F (36.7 C) (Axillary) 16 100%  Intake/Output Summary (Last 24 hours) at 07/10/16 0623 Last data filed at 07/10/16 0400  Gross per 24 hour  Intake          1310.33 ml  Output                0 ml  Net          1310.33 ml    Doppler PT/DP/peroneal signals left LE Groin soft without hematoma, incisions clean and dry Heart RRR Lungs non labored breathing   Assessment/Planning: POD # 2 PROCEDURE: Left femoral to below-knee popliteal bypass with 6 mm probe patent Gore-Tex graft  Zofran changed to phenergan for nausea, seems to be subsiding. We will continue a clear liquid diet this am, if she tolerates this ok we will advance her diet later today. Labs for today pending 1 unit of PRBC transfused yesterday, plan for second unit today with HD Hypertension currently 143/77 , likely due to nausea and vomiting yesterday.  She was given PRN dose of 5mg  of hydralazine given for BP of 175/56.     Laurence Slate [facility] 07/10/2016 7:23 AM --  Laboratory Lab Results:  Recent Labs  07/07/16 0915 07/08/16 1054 07/09/16 0639  WBC 10.9*  --  13.2*  HGB 10.3* 11.2* 7.0*  HCT 31.7* 33.0* 22.9*  PLT 454*  --  312   BMET  Recent Labs  07/07/16 0915 07/08/16 1054 07/09/16 0501  NA 134* 131* 136  K 3.8 6.0* 5.0  CL 95*  --  99*  CO2 26  --  23  GLUCOSE 150* 116* 162*  BUN 16  --  21*  CREATININE 4.20*  --  4.77*  CALCIUM 9.5  --  8.9    COAG Lab Results  Component Value Date   INR 1.03 07/07/2016   INR 1.02 10/06/2015   INR 1.05 06/13/2015   No results found for: PTT  I have examined the patient, reviewed and agree with above.Palp PT pulse.  Great and 4th toe with dry gangrene.  Dr Sharol Given consulted  Nikkole Placzek, Sherren Mocha, MD 07/10/2016 10:09 AM

## 2016-07-10 NOTE — Evaluation (Signed)
Occupational Therapy Evaluation Patient Details Name: Destiny Day MRN: [id number] DOB: Dec 15, 1950 Today's Date: 07/10/2016    History of Present Illness This 65 yo female with PMHx: HTN, DM, asthma, anxiety, CHF, CKD presents to acute OT s/p eft femoral to below-knee popliteal bypass with 6 mm probe patent Gore-Tex graft.   Clinical Impression   This 65 yo female admitted and underwent above presents to acute OT with deficits below (see OT problem list) thus affecting her PLOF of Mod I to independent with basic and most IADLs. She will benefit from acute OT with follow up OT at SNF to get back to PLOF.    Follow Up Recommendations  SNF    Equipment Recommendations  Other (comment) (TBD at next venue)       Precautions / Restrictions Precautions Precautions: Fall Restrictions Weight Bearing Restrictions: No      Mobility Bed Mobility Overal bed mobility: Needs Assistance Bed Mobility: Supine to Sit     Supine to sit: HOB elevated;Mod assist     General bed mobility comments: assist for L LE, trunk upright and scooting to EOB  Transfers Overall transfer level: Needs assistance Equipment used: Rolling walker (2 wheeled) Transfers: Sit to/from Stand Sit to Stand: +2 safety/equipment;Mod assist         General transfer comment: up from EOB, lifting assist, cues for L LE weight bearing, increased time, assist to sit with cues for L LE positioning and hand placement    Balance Overall balance assessment: Needs assistance Sitting-balance support: Feet unsupported Sitting balance-Leahy Scale: Fair Sitting balance - Comments: sitting on uneven surface on bed   Standing balance support: Bilateral upper extremity supported Standing balance-Leahy Scale: Poor Standing balance comment: UE support and assist for balance                            ADL Overall ADL's : Needs assistance/impaired Eating/Feeding: Independent;Sitting   Grooming: Set  up;Sitting;Supervision/safety   Upper Body Bathing: Set up;Sitting;Supervision/ safety   Lower Body Bathing: Maximal assistance (Min A sit<>stand)   Upper Body Dressing : Supervision/safety;Set up;Sitting   Lower Body Dressing: Maximal assistance (Min A sit<>stand)   Toilet Transfer: Minimal assistance;Ambulation;RW Toilet Transfer Details (indicate cue type and reason): Bed around to recliner on other side Toileting- Clothing Manipulation and Hygiene: Moderate assistance (Min A sit<>stand)                         Pertinent Vitals/Pain Pain Assessment: 0-10 Pain Score: 6  Pain Location: left thigh Pain Descriptors / Indicators: Aching Pain Intervention(s): Limited activity within patient's tolerance;Monitored during session;Repositioned     Hand Dominance Right   Extremity/Trunk Assessment Upper Extremity Assessment Upper Extremity Assessment: Overall WFL for tasks assessed   Lower Extremity Assessment Lower Extremity Assessment: RLE deficits/detail RLE Deficits / Details: AROM limited by pain, does not fully extend the knee noted incisions on medial knee RLE: Unable to fully assess due to pain       Communication Communication Communication: No difficulties   Cognition Arousal/Alertness: Awake/alert Behavior During Therapy: WFL for tasks assessed/performed Overall Cognitive Status: Within Functional Limits for tasks assessed                                Home Living Family/patient expects to be discharged to:: Skilled nursing facility Living Arrangements: Spouse/significant other Available Help at  Discharge: Family;Available PRN/intermittently Type of Home: House Home Access: Stairs to enter [employer] of Steps: 2 Entrance Stairs-Rails: Left Home Layout: Multi-level;Bed/bath upstairs Alternate Level Stairs-Number of Steps: 6 and 6 split level Alternate Level Stairs-Rails: Left Bathroom Shower/Tub: Curtain;Tub/shower  unit Shower/tub characteristics: Curtain Biochemist, clinical: Standard Bathroom Accessibility: No   Home Equipment: Environmental consultant - 4 wheels;Cane - single point;Shower seat;Crutches          Prior Functioning/Environment Level of Independence: Independent with assistive device(s)        Comments: Uses 1 crutch for ambulation. Drives self to dialysis. Does not cook, clean recently due to pain in LE.        OT Problem List: Decreased strength;Decreased range of motion;Impaired balance (sitting and/or standing);Pain   OT Treatment/Interventions: Self-care/ADL training;Therapeutic activities;Patient/family education;DME and/or AE instruction;Balance training    OT Goals(Current goals can be found in the care plan section) Acute Rehab OT Goals Patient Stated Goal: To return to independent OT Goal Formulation: With patient/family Time For Goal Achievement: 07/24/16 Potential to Achieve Goals: Good  OT Frequency: Min 2X/week   Barriers to D/C: Decreased caregiver support          Co-evaluation PT/OT/SLP Co-Evaluation/Treatment: Yes Reason for Co-Treatment: For patient/therapist safety PT goals addressed during session: Mobility/safety with mobility;Proper use of DME OT goals addressed during session: Strengthening/ROM      End of Session Equipment Utilized During Treatment: Gait belt;Rolling walker Nurse Communication: Mobility status  Activity Tolerance: Patient tolerated treatment well Patient left: in chair;with call bell/phone within reach   Time: 1013-1042 OT Time Calculation (min): 29 min Charges:  OT General Charges $OT Visit: 1 Procedure OT Evaluation $OT Eval Moderate Complexity: 1 Procedure  Almon Register [phone] 07/10/2016, 1:20 PM

## 2016-07-10 NOTE — Evaluation (Signed)
Physical Therapy Evaluation Patient Details Name: Destiny Day MRN: [id number] DOB: 02/06/1951 Today's Date: 07/10/2016   History of Present Illness  This 65 yo female with PMHx: HTN, DM, asthma, anxiety, CHF, CKD presents to acute OT s/p eft femoral to below-knee popliteal bypass with 6 mm probe patent Gore-Tex graft.  Clinical Impression  Patient presents with decreased independence with mobility due to deficits listed in PT problem list.  She presents at mod A for mobility with pain limiting ambulation.  Feel she is at risk for falls if she attempts to get to [facility] for transportation to dialysis as she reports multiple steps at entry and inclined drive.  PT to follow acutely.     Follow Up Recommendations SNF;Supervision/Assistance - 24 hour    Equipment Recommendations  Rolling walker with 5" wheels    Recommendations for Other Services       Precautions / Restrictions Precautions Precautions: Fall Restrictions Weight Bearing Restrictions: No      Mobility  Bed Mobility Overal bed mobility: Needs Assistance Bed Mobility: Supine to Sit     Supine to sit: HOB elevated;Mod assist     General bed mobility comments: assist for L LE, trunk upright and scooting to EOB  Transfers Overall transfer level: Needs assistance Equipment used: Rolling walker (2 wheeled) Transfers: Sit to/from Stand Sit to Stand: +2 safety/equipment;Mod assist         General transfer comment: up from EOB, lifting assist, cues for L LE weight bearing, increased time, assist to sit with cues for L LE positioning and hand placement  Ambulation/Gait Ambulation/Gait assistance: Min assist;+2 safety/equipment Ambulation Distance (Feet): 15 Feet Assistive device: Rolling walker (2 wheeled) Gait Pattern/deviations: Step-to pattern;Antalgic;Trunk flexed     General Gait Details: assist for balance, lines and safety, around bed to recliner  Stairs            Wheelchair Mobility     Modified Rankin (Stroke Patients Only)       Balance Overall balance assessment: Needs assistance Sitting-balance support: Feet unsupported Sitting balance-Leahy Scale: Fair Sitting balance - Comments: sitting on uneven surface on bed   Standing balance support: Bilateral upper extremity supported Standing balance-Leahy Scale: Poor Standing balance comment: UE support and assist for balance                             Pertinent Vitals/Pain Pain Assessment: 0-10 Pain Score: 6  Pain Location: left thigh Pain Descriptors / Indicators: Aching Pain Intervention(s): Limited activity within patient's tolerance;Monitored during session;Repositioned    Home Living Family/patient expects to be discharged to:: Private residence Living Arrangements: Spouse/significant other Available Help at Discharge: Family;Available PRN/intermittently Type of Home: House Home Access: Stairs to enter Entrance Stairs-Rails: Left Entrance Stairs-Number of Steps: 2 Home Layout: Multi-level;Bed/bath upstairs Home Equipment: Walker - 4 wheels;Cane - single point;Shower seat;Crutches      Prior Function Level of Independence: Independent with assistive device(s)         Comments: Uses 1 crutch for ambulation. Drives self to dialysis. Does not cook, clean recently due to pain in LE.     Hand Dominance   Dominant Hand: Right    Extremity/Trunk Assessment   Upper Extremity Assessment Upper Extremity Assessment: Defer to OT evaluation    Lower Extremity Assessment Lower Extremity Assessment: RLE deficits/detail RLE Deficits / Details: AROM limited by pain, does not fully extend the knee noted incisions on medial knee RLE: Unable to fully assess  due to pain       Communication   Communication: No difficulties  Cognition Arousal/Alertness: Awake/alert Behavior During Therapy: WFL for tasks assessed/performed Overall Cognitive Status: Within Functional Limits for tasks  assessed                      General Comments      Exercises     Assessment/Plan    PT Assessment Patient needs continued PT services  PT Problem List Decreased strength;Decreased mobility;Decreased range of motion;Decreased balance;Decreased activity tolerance;Pain;Decreased knowledge of use of DME          PT Treatment Interventions DME instruction;Gait training;Functional mobility training;Stair training;Therapeutic exercise;Therapeutic activities;Patient/family education;Balance training    PT Goals (Current goals can be found in the Care Plan section)  Acute Rehab PT Goals Patient Stated Goal: To return to independent PT Goal Formulation: With patient Time For Goal Achievement: 07/17/16 Potential to Achieve Goals: Good    Frequency Min 3X/week   Barriers to discharge Decreased caregiver support spouse works    Co-evaluation PT/OT/SLP Co-Evaluation/Treatment: Yes Reason for Co-Treatment: For patient/therapist safety PT goals addressed during session: Mobility/safety with mobility;Proper use of DME OT goals addressed during session: ADL's and self-care;Strengthening/ROM       End of Session Equipment Utilized During Treatment: Gait belt Activity Tolerance: Patient limited by pain Patient left: in chair;with call bell/phone within reach;with chair alarm set Nurse Communication: Mobility status         Time: 1015-1035 PT Time Calculation (min) (ACUTE ONLY): 20 min   Charges:   PT Evaluation $PT Eval Moderate Complexity: 1 Procedure     PT G CodesReginia Naas 07-23-16, 12:47 PM  Magda Kiel, [place] 07/23/16

## 2016-07-10 NOTE — Consult Note (Signed)
ORTHOPAEDIC CONSULTATION  REQUESTING PHYSICIAN: Rosetta Posner, MD  Chief Complaint: Ischemic changes left foot  HPI: Destiny Day is a 65 y.o. female who presents with gangrenous changes to the left great toe and left fourth toe with ulcerations. Patient has undergone revascularization with excellent inflow into the left foot. Patient states that she is doing much better. Patient is seen for evaluation for the gangrene and ulceration of the great toe and fourth toe.  Past Medical History:  Diagnosis Date  . Abdominal bruit   . Anemia   . Anxiety   . Arthritis    Osteoarthritis  . Asthma   . Cervical disc disease    "pinced nerve"  . CHF (congestive heart failure) ([place])   . Complication of anesthesia    " after I got home from my last procedure, I started itching."  . Diabetes mellitus    insulin dependent  . Diverticulitis   . ESRD on peritoneal dialysis [facility])    Hemodialysis - MWF  . GERD (gastroesophageal reflux disease)    from medications  . GI bleed 03/31/2013  . History of hiatal hernia   . Hyperlipidemia   . Hypertension   . Osteoporosis   . Pneumonia   . Seasonal allergies   . Shortness of breath dyspnea    WIth exertion  . Sleep apnea    can't afford cpap   Past Surgical History:  Procedure Laterality Date  . ABDOMINAL HYSTERECTOMY  1993`  . AV FISTULA PLACEMENT Left 04/21/2016   Procedure: INSERTION OF ARTERIOVENOUS (AV) GORE-TEX GRAFT ARM LEFT;  Surgeon: Elam Dutch, MD;  Location: [place];  Service: Vascular;  Laterality: Left;  . [place] TRANSPOSITION Left 07/10/2014   Procedure: BASCILIC VEIN TRANSPOSITION;  Surgeon: Angelia Mould, MD;  Location: [place];  Service: Vascular;  Laterality: Left;  . BASCILIC VEIN TRANSPOSITION Right 11/08/2014   Procedure: FIRST STAGE BASILIC VEIN TRANSPOSITION;  Surgeon: Angelia Mould, MD;  Location: [facility];  Service: Vascular;  Laterality: Right;  . BASCILIC VEIN TRANSPOSITION Right 01/18/2015    Procedure: SECOND STAGE BASILIC VEIN TRANSPOSITION;  Surgeon: Angelia Mould, MD;  Location: [facility] OR;  Service: Vascular;  Laterality: Right;  . ESOPHAGOGASTRODUODENOSCOPY N/A 03/31/2013   Procedure: ESOPHAGOGASTRODUODENOSCOPY (EGD);  Surgeon: Gatha Mayer, MD;  Location: [facility] ENDOSCOPY;  Service: Endoscopy;  Laterality: N/A;  . EYE SURGERY     laser surgery  . FEMORAL-POPLITEAL BYPASS GRAFT Left 07/08/2016   Procedure: LEFT  FEMORAL-BELOW KNEE POPLITEAL ARTERY BYPASS GRAFT USING 6MM X 80 CM PROPATEN GORETEX GRAFT WITH RINGS.;  Surgeon: Rosetta Posner, MD;  Location: [place];  Service: Vascular;  Laterality: Left;  . FISTULOGRAM Left 10/29/2014   Procedure: FISTULOGRAM;  Surgeon: Angelia Mould, MD;  Location: [facility] CATH LAB;  Service: Cardiovascular;  Laterality: Left;  . LIGATION OF ARTERIOVENOUS  FISTULA Left 04/21/2016   Procedure: LIGATION OF ARTERIOVENOUS  FISTULA LEFT ARM;  Surgeon: Elam Dutch, MD;  Location: [place];  Service: Vascular;  Laterality: Left;  . PATCH ANGIOPLASTY Right 01/18/2015   Procedure: BASILIC VEIN PATCH ANGIOPLASTY USING VASCUGUARD PATCH;  Surgeon: Angelia Mould, MD;  Location: [place];  Service: Vascular;  Laterality: Right;  . PERIPHERAL VASCULAR CATHETERIZATION N/A 06/23/2016   Procedure: Abdominal Aortogram w/Lower Extremity;  Surgeon: Serafina Mitchell, MD;  Location: [employer] CV LAB;  Service: Cardiovascular;  Laterality: N/A;  . PERIPHERAL VASCULAR CATHETERIZATION  06/23/2016   Procedure: Peripheral Vascular Intervention;  Surgeon:  Serafina Mitchell, MD;  Location: [place] CV LAB;  Service: Cardiovascular;;  lt common and external illiac artery   Social History   Social History  . Marital status: Married    Spouse name: [name]  . Number of children: 4  . Years of education: some collg   Occupational History  . Retired    Social History Main Topics  . Smoking status: Former Smoker    Packs/day: 0.35    Years: 40.00    Types:  Cigarettes    Quit date: 05/10/2012  . Smokeless tobacco: Never Used  . Alcohol use No  . Drug use:      Comment: marijuana; quit in early 1980's  . Sexual activity: Yes   Other Topics Concern  . None   Social History Narrative   Patient lives at home with spouse.   Caffeine Use: 1 cup daily   Family History  Problem Relation Age of Onset  . Other Mother     not sure of cause of death  . Diabetes Father   . Pancreatic cancer Maternal Grandmother   . Colon cancer Neg Hx    - negative except otherwise stated in the family history section Allergies  Allergen Reactions  . Lisinopril Cough  . Prednisone Other (See Comments)    Excessive fluid buildup   Prior to Admission medications   Medication Sig Start Date End Date Taking? Authorizing Provider  amLODipine (NORVASC) 10 MG tablet Take 10 mg by mouth at bedtime.    Yes Historical Provider, MD  aspirin 81 MG chewable tablet Chew 81 mg by mouth every evening.    Yes Historical Provider, MD  atorvastatin (LIPITOR) 20 MG tablet Take 20 mg by mouth at bedtime.    Yes Historical Provider, MD  budesonide-formoterol (SYMBICORT) 160-4.5 MCG/ACT inhaler Inhale 1 puff into the lungs every evening.   Yes Historical Provider, MD  carvedilol (COREG) 12.5 MG tablet Take 2 tablets (25 mg total) by mouth 2 (two) times daily with a meal. 07/03/15  Yes Jonetta Osgood, MD  CEFOXITIN SODIUM IV Inject into the vein.   Yes Historical Provider, MD  cinacalcet (SENSIPAR) 30 MG tablet Take 30 mg by mouth every evening.    Yes Historical Provider, MD  clopidogrel (PLAVIX) 75 MG tablet Take 1 tablet (75 mg total) by mouth daily. Patient taking differently: Take 75 mg by mouth every evening.  06/23/16  Yes Serafina Mitchell, MD  Ferric Citrate (AURYXIA) 1 GM 210 MG(Fe) TABS Take 630 mg by mouth 3 (three) times daily with meals.   Yes Historical Provider, MD  fluticasone (FLONASE) 50 MCG/ACT nasal spray Place 1 spray into both nostrils daily as needed for  allergies or rhinitis.   Yes Historical Provider, MD  insulin aspart (NOVOLOG) 100 UNIT/ML injection Inject 0-8 Units into the skin 3 (three) times daily before meals. Sliding scale   Yes Historical Provider, MD  isosorbide-hydrALAZINE (BIDIL) 20-37.5 MG tablet Take 1 tablet by mouth 2 (two) times daily. 07/03/15  Yes Shanker Kristeen Mans, MD  LEVEMIR FLEXTOUCH 100 UNIT/ML Pen Inject 10 Units into the muscle at bedtime.  01/16/15  Yes Historical Provider, MD  loratadine (CLARITIN) 10 MG tablet Take 10 mg by mouth daily as needed for allergies.   Yes Historical Provider, MD  multivitamin (RENA-VIT) TABS tablet Take 1 tablet by mouth every evening.  01/24/15  Yes Historical Provider, MD  mupirocin ointment (BACTROBAN) 2 % Apply 1 application topically 2 (two) times daily. 06/15/16  Yes Chrystie Nose, NP  omeprazole (PRILOSEC) 20 MG capsule Take 20 mg by mouth daily.   Yes Historical Provider, MD  oxyCODONE-acetaminophen (PERCOCET/ROXICET) 5-325 MG tablet Take 1 tablet by mouth every 8 (eight) hours as needed. Patient taking differently: Take 1 tablet by mouth every 8 (eight) hours as needed for moderate pain.  06/30/16  Yes Ulyses Amor, PA-C  VANCOMYCIN HCL IV Inject into the vein one time in dialysis.   Yes Historical Provider, MD  Vitamin D, Ergocalciferol, (DRISDOL) 50000 units CAPS capsule Take 50,000 Units by mouth every 7 (seven) days. Mondays 01/17/16  Yes Historical Provider, MD  albuterol (PROVENTIL HFA;VENTOLIN HFA) 108 (90 BASE) MCG/ACT inhaler Inhale 1-2 puffs into the lungs every 6 (six) hours as needed for wheezing or shortness of breath.    Historical Provider, MD  traMADol (ULTRAM) 50 MG tablet Take 1 tablet (50 mg total) by mouth every 6 (six) hours as needed. 06/16/16   Rosetta Posner, MD   Dg Abd Portable 1v  Result Date: 07/09/2016 CLINICAL DATA:  Vomiting EXAM: PORTABLE ABDOMEN - 1 VIEW COMPARISON:  None. FINDINGS: Scattered large and small bowel gas is noted. Fecal material is  noted within the colon consistent with a mild degree of constipation. No obstructive changes are seen. Aortic calcifications are noted. Left iliac arterial stents are seen. Calcifications are noted in the hila of the kidneys bilaterally likely related to renal arterial calcifications. No bony abnormality is seen. Degenerative changes of the lumbar spine are noted. IMPRESSION: Mild constipation.  No other focal abnormality is noted. Electronically Signed   By: Inez Catalina M.D.   On: 07/09/2016 15:26   - pertinent xrays, CT, MRI studies were reviewed and independently interpreted  Positive ROS: All other systems have been reviewed and were otherwise negative with the exception of those mentioned in the HPI and as above.  Physical Exam: General: Alert, no acute distress Psychiatric: Patient is competent for consent with normal mood and affect Lymphatic: No axillary or cervical lymphadenopathy Cardiovascular: No pedal edema Respiratory: No cyanosis, no use of accessory musculature GI: No organomegaly, abdomen is soft and non-tender  Skin: Patient has a ischemic ulcer over the tip of the left great toe and the lateral border of the fourth toe. There is dry dark ischemic changes to the fourth toe with decreased turgor. Great toe has exposed bone of the tuft with no ascending cellulitis no abscess no odor no drainage.   Neurologic: Patient does not have protective sensation bilateral lower extremities.   MUSCULOSKELETAL:  Examination patient has excellent inflow to the left foot her foot is warm. She has chronic ischemic changes to the fourth toe but no signs of abscess great toe has a chronic osteomyelitis but is stable with a distal open ulcer.  Assessment: Assessment: Revascularization left lower extremity with osteomyelitis and ischemic changes to the great toe and fourth toe.  Plan: Plan: Patient's foot is stable I think it would be helpful to give her some time to see how much of the  fourth toe and great toe recover with the improved circulation. Anticipate discharge to skilled nursing I will follow-up 2 weeks after discharge and evaluate for surgical intervention at that time.  Thank you for the consult and the opportunity to see Ms. Karleen Hampshire, [place] [phone] 3:55 PM

## 2016-07-11 ENCOUNTER — Inpatient Hospital Stay (HOSPITAL_COMMUNITY): Payer: Medicare Other

## 2016-07-11 DIAGNOSIS — M150 Primary generalized (osteo)arthritis: Secondary | ICD-10-CM | POA: Insufficient documentation

## 2016-07-11 DIAGNOSIS — M5090 Cervical disc disorder, unspecified, unspecified cervical region: Secondary | ICD-10-CM | POA: Insufficient documentation

## 2016-07-11 DIAGNOSIS — K5792 Diverticulitis of intestine, part unspecified, without perforation or abscess without bleeding: Secondary | ICD-10-CM | POA: Insufficient documentation

## 2016-07-11 DIAGNOSIS — M810 Age-related osteoporosis without current pathological fracture: Secondary | ICD-10-CM | POA: Insufficient documentation

## 2016-07-11 DIAGNOSIS — J188 Other pneumonia, unspecified organism: Secondary | ICD-10-CM | POA: Diagnosis present

## 2016-07-11 DIAGNOSIS — K2100 Gastro-esophageal reflux disease with esophagitis, without bleeding: Secondary | ICD-10-CM | POA: Insufficient documentation

## 2016-07-11 DIAGNOSIS — K469 Unspecified abdominal hernia without obstruction or gangrene: Secondary | ICD-10-CM | POA: Insufficient documentation

## 2016-07-11 LAB — CBC
HCT: 26.2 % — ABNORMAL LOW (ref 36.0–46.0)
Hemoglobin: 8.6 g/dL — ABNORMAL LOW (ref 12.0–15.0)
MCH: 30 pg (ref 26.0–34.0)
MCHC: 32.8 g/dL (ref 30.0–36.0)
MCV: 91.3 fL (ref 78.0–100.0)
Platelets: 296 10*3/uL (ref 150–400)
RBC: 2.87 MIL/uL — ABNORMAL LOW (ref 3.87–5.11)
RDW: 15.3 % (ref 11.5–15.5)
WBC: 18.6 10*3/uL — ABNORMAL HIGH (ref 4.0–10.5)

## 2016-07-11 LAB — GLUCOSE, CAPILLARY
Glucose-Capillary: 134 mg/dL — ABNORMAL HIGH (ref 65–99)
Glucose-Capillary: 140 mg/dL — ABNORMAL HIGH (ref 65–99)
Glucose-Capillary: 214 mg/dL — ABNORMAL HIGH (ref 65–99)
Glucose-Capillary: 206 mg/dL — ABNORMAL HIGH (ref 65–99)
Glucose-Capillary: 152 mg/dL — ABNORMAL HIGH (ref 65–99)

## 2016-07-11 LAB — TYPE AND SCREEN
ABO/RH(D): B POS
Antibody Screen: NEGATIVE
Unit division: 0
Unit division: 0

## 2016-07-11 LAB — BASIC METABOLIC PANEL
Anion gap: 14 (ref 5–15)
BUN: 18 mg/dL (ref 6–20)
CO2: 25 mmol/L (ref 22–32)
Calcium: 8.6 mg/dL — ABNORMAL LOW (ref 8.9–10.3)
Chloride: 93 mmol/L — ABNORMAL LOW (ref 101–111)
Creatinine, Ser: 4.57 mg/dL — ABNORMAL HIGH (ref 0.44–1.00)
GFR calc Af Amer: 11 mL/min — ABNORMAL LOW (ref >60)
GFR calc non Af Amer: 9 mL/min — ABNORMAL LOW (ref >60)
Glucose, Bld: 130 mg/dL — ABNORMAL HIGH (ref 65–99)
Potassium: 3.8 mmol/L (ref 3.5–5.1)
Sodium: 132 mmol/L — ABNORMAL LOW (ref 135–145)

## 2016-07-11 MED ORDER — POLYETHYLENE GLYCOL 3350 17 G PO PACK
17.0000 g | PACK | Freq: Every day | ORAL | Status: DC
  Administered 2016-07-11 – 2016-07-13 (×3): 17 g via ORAL
  Filled 2016-07-11 (×3): qty 1

## 2016-07-11 MED ORDER — INSULIN ASPART 100 UNIT/ML ~~LOC~~ SOLN: 0.0000 [IU] | Freq: Three times a day (TID) | SUBCUTANEOUS | Status: DC

## 2016-07-11 MED ORDER — INSULIN ASPART 100 UNIT/ML ~~LOC~~ SOLN
0.0000 [IU] | Freq: Three times a day (TID) | SUBCUTANEOUS | Status: DC
  Administered 2016-07-11: 3 [IU] via SUBCUTANEOUS
  Administered 2016-07-12: 5 [IU] via SUBCUTANEOUS
  Administered 2016-07-12: 1 [IU] via SUBCUTANEOUS
  Administered 2016-07-12: 2 [IU] via SUBCUTANEOUS

## 2016-07-11 MED ORDER — INSULIN ASPART 100 UNIT/ML ~~LOC~~ SOLN
0.0000 [IU] | Freq: Every day | SUBCUTANEOUS | Status: DC
  Administered 2016-07-12: 2 [IU] via SUBCUTANEOUS

## 2016-07-11 NOTE — Progress Notes (Signed)
[facility] Progress Note   Subjective:  Having sig N/V, had 1 stool since admission, yest, hard  Objective Vitals:   07/10/16 2000 07/10/16 2035 07/10/16 2119 07/11/16 0539  BP: (!) 159/64 (!) 156/63 135/62 (!) 143/53  Pulse: 96 99 95 88  Resp: 18 17  18   Temp:  99 F (37.2 C) 98.3 F (36.8 C) 97.7 F (36.5 C)  TempSrc:  Oral Oral Oral  SpO2:  99% 100% 97%  Weight:  63.2 kg (139 lb 5.3 oz)    Height:       Physical Exam General: chron ill appearing, no distress Heart: RRR; no murmur. Lungs: CTA anteriorly, pain with sitting up for posterior exam Abdomen: soft ntnd +bs Extremities: Bandaged L foot, long intact/dry incision down L leg s/p procedure. Dialysis Access: LUE AVF + thrill  Additional Objective Labs: Basic Metabolic Panel:  Recent Labs Lab 07/09/16 0501 07/10/16 0743 07/11/16 0338  NA 136 136 132*  K 5.0 5.1 3.8  CL 99* 96* 93*  CO2 23 23 25   GLUCOSE 162* 200* 130*  BUN 21* 40* 18  CREATININE 4.77* 6.70* 4.57*  CALCIUM 8.9 9.3 8.6*   Liver Function Tests:  Recent Labs Lab 07/07/16 0915  AST 21  ALT 14  ALKPHOS 120  BILITOT 0.3  PROT 7.7  ALBUMIN 3.2*   CBC:  Recent Labs Lab 07/07/16 0915  07/09/16 0639 07/10/16 0743 07/11/16 0338  WBC 10.9*  --  13.2* 20.7* 18.6*  HGB 10.3*  < > 7.0* 8.7* 8.6*  HCT 31.7*  < > 22.9* 27.5* 26.2*  MCV 90.3  --  94.2 92.0 91.3  PLT 454*  --  312 370 296  < > = values in this interval not displayed. CBG:  Recent Labs Lab 07/09/16 0847 07/09/16 1206 07/10/16 2122 07/11/16 0616 07/11/16 1130  GLUCAP 143* 165* 99 134* 140*   Studies/Results: Dg Abd Portable 1v  Result Date: 07/09/2016 CLINICAL DATA:  Vomiting EXAM: PORTABLE ABDOMEN - 1 VIEW COMPARISON:  None. FINDINGS: Scattered large and small bowel gas is noted. Fecal material is noted within the colon consistent with a mild degree of constipation. No obstructive changes are seen. Aortic calcifications are noted. Left iliac  arterial stents are seen. Calcifications are noted in the hila of the kidneys bilaterally likely related to renal arterial calcifications. No bony abnormality is seen. Degenerative changes of the lumbar spine are noted. IMPRESSION: Mild constipation.  No other focal abnormality is noted. Electronically Signed   By: Inez Catalina M.D.   On: 07/09/2016 15:26   Medications:  . sodium chloride   Intravenous Once  . amLODipine  10 mg Oral QHS  . aspirin  81 mg Oral QPM  . atorvastatin  20 mg Oral QHS  . carvedilol  25 mg Oral BID WC  . cinacalcet  30 mg Oral Q supper  . clopidogrel  75 mg Oral QPM  . docusate sodium  100 mg Oral Daily  . doxercalciferol  4 mcg Intravenous Q M,W,F-HD  . feeding supplement (NEPRO CARB STEADY)  237 mL Oral BID BM  . ferric citrate  630 mg Oral TID WC  . insulin detemir  10 Units Subcutaneous QHS  . isosorbide-hydrALAZINE  1 tablet Oral BID  . multivitamin  1 tablet Oral QHS  . mupirocin ointment  1 application Topical BID  . pantoprazole  40 mg Oral Daily    Dialysis Orders: [facility] MWF 4 hours 400/800 63.5 kg 3.0 K/2.25 Ca UF Profile 4  Heparin 2500 units IV initial bolus/1000 units IV mid run Mircera 50 mcg IV q 4 weeks (last dose 12/12/201 Last HGB 10) Venofer 50 mg IV weekly (last dose 07/07/16 Fe 29 Tsat 12%. Recent FE load) Hectoral 4 mcg IV q tx (Last PTH 453 05/20/16)  BMD medications: Auryxia 210 mg PO TID w/meals  Sensipar 60 mg PO q day with supper Last Phos 6.4 Ca 9.2 Ca 9.3  Assessment: 1.  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 Nausea/ vomiting - possible constipation vs other (infection). Get plain film, enema if needed 3.  ESRD - MWF [place], for HD now. No heparin.  4.  Hypertension/volume: BP ok. Continue meds and EDW. 5.  Anemia: Hgb 8.7, s/p 1U PRBC on 12/14. Monitor. 6.  Metabolic bone disease  -  Cont binders, sensipar when eating. Cont VDRA.  7.  Nutrition: Alb 3.2, adding Nepro supplements. 8. DM: On insulin, Per primary.  Plan - plain film abd, consider lax/ enemas  Kelly Splinter MD [place] pager [phone]   07/11/2016, 12:44 PM

## 2016-07-11 NOTE — Progress Notes (Signed)
Patient lethargic, easy to arouse, but quickly closes eyes and goes back to sleep. Patient has napped in between meals today. Blood sugar by finger stick taken and result 206. Vital signs stable. Patient alert and oriented x4. Patient moans periodically throughout assessment and states "my hands ache." patient's spouse at bedside states that this is normal for the patient at home. Dr Bridgett Larsson notified of patient's status and results from vitals and CBG.

## 2016-07-11 NOTE — Progress Notes (Addendum)
Progress Note  Subjective  - POD #3  Some pain with left leg incisions.  Objective Vitals:   07/10/16 2119 07/11/16 0539  BP: 135/62 (!) 143/53  Pulse: 95 88  Resp:  18  Temp: 98.3 F (36.8 C) 97.7 F (36.5 C)    Intake/Output Summary (Last 24 hours) at 07/11/16 1610 Last data filed at 07/10/16 2035  Gross per 24 hour  Intake              935 ml  Output               15 ml  Net              920 ml   Leg leg incisions healing well. Brisk left PT, DP and peroneal doppler signals Dry gangrene left great toe and left 4th toe  Assessment/Planning: 65 y.o. female is s/p: Left femoral to below-knee popliteal bypass with 6 mm probe patent Gore-Tex graft 3 Days Post-Op   Bypass is patent.  Incisions healing well. Appreciate Dr. Sharol Given seeing. Will allow time to toes to declare themselves. He will follow-up as an outpatient regarding gangrenous toes.  CSW for SNF placement.  [place] for d/c to SNF once bed available.   Alvia Grove 07/11/2016 9:07 AM --  Laboratory CBC    Component Value Date/Time   WBC 18.6 (H) 07/11/2016 0338   HGB 8.6 (L) 07/11/2016 0338   HCT 26.2 (L) 07/11/2016 0338   PLT 296 07/11/2016 0338    BMET    Component Value Date/Time   NA 132 (L) 07/11/2016 0338   K 3.8 07/11/2016 0338   CL 93 (L) 07/11/2016 0338   CO2 25 07/11/2016 0338   GLUCOSE 130 (H) 07/11/2016 0338   BUN 18 07/11/2016 0338   CREATININE 4.57 (H) 07/11/2016 0338   CALCIUM 8.6 (L) 07/11/2016 0338   GFRNONAA 9 (L) 07/11/2016 0338   GFRAA 11 (L) 07/11/2016 0338    COAG Lab Results  Component Value Date   INR 1.03 07/07/2016   INR 1.02 10/06/2015   INR 1.05 06/13/2015   No results found for: PTT  Antibiotics Anti-infectives    Start     Dose/Rate Route Frequency Ordered Stop   07/09/16 0100  cefUROXime (ZINACEF) 1.5 g in dextrose 5 % 50 mL IVPB     1.5 g 100 mL/hr over 30 Minutes Intravenous Every 12 hours 07/08/16 2044 07/09/16 1532   07/08/16 1036  dextrose 5  % with cefUROXime (ZINACEF) ADS Med    Comments:  Scronce, Trina   : cabinet override      07/08/16 1036 07/08/16 1345   07/08/16 1031  cefUROXime (ZINACEF) 1.5 g in dextrose 5 % 50 mL IVPB     1.5 g 100 mL/hr over 30 Minutes Intravenous 30 min pre-op 07/08/16 1031 07/08/16 1345       Virgina Jock, PA-C [facility] Office: [phone] Pager: [phone] 07/11/2016 9:07 AM   Addendum   RIGHT   LEFT    PRESSURE WAVEFORM  PRESSURE WAVEFORM  BRACHIAL 140 Biphasic BRACHIAL AVF   DP 48 Monophasic DP 88 Monophasic  PT 43 Monophasic PT 95 Monophasic    RIGHT LEFT  ABI 0.34 0.68    I have independently interviewed and examined the patient, and I agree with the physician assistant's findings.  Incision look good.  ABI document augmentation in flow from 0.46 to 0.68.  Mgmt of toes per Dr. Sharol Given.  Awaiting SNF placement  Adele Barthel, MD, FACS [facility] [place] Office: [phone] Pager: [phone]  07/11/2016, 9:18 AM

## 2016-07-12 LAB — CBC
HCT: 25.3 % — ABNORMAL LOW (ref 36.0–46.0)
Hemoglobin: 8.2 g/dL — ABNORMAL LOW (ref 12.0–15.0)
MCH: 29.8 pg (ref 26.0–34.0)
MCHC: 32.4 g/dL (ref 30.0–36.0)
MCV: 92 fL (ref 78.0–100.0)
Platelets: 322 10*3/uL (ref 150–400)
RBC: 2.75 MIL/uL — ABNORMAL LOW (ref 3.87–5.11)
RDW: 15.4 % (ref 11.5–15.5)
WBC: 15.3 10*3/uL — ABNORMAL HIGH (ref 4.0–10.5)

## 2016-07-12 LAB — BASIC METABOLIC PANEL
Anion gap: 14 (ref 5–15)
BUN: 29 mg/dL — ABNORMAL HIGH (ref 6–20)
CO2: 25 mmol/L (ref 22–32)
Calcium: 8.4 mg/dL — ABNORMAL LOW (ref 8.9–10.3)
Chloride: 91 mmol/L — ABNORMAL LOW (ref 101–111)
Creatinine, Ser: 6.06 mg/dL — ABNORMAL HIGH (ref 0.44–1.00)
GFR calc Af Amer: 8 mL/min — ABNORMAL LOW (ref >60)
GFR calc non Af Amer: 7 mL/min — ABNORMAL LOW (ref >60)
Glucose, Bld: 177 mg/dL — ABNORMAL HIGH (ref 65–99)
Potassium: 4.7 mmol/L (ref 3.5–5.1)
Sodium: 130 mmol/L — ABNORMAL LOW (ref 135–145)

## 2016-07-12 LAB — GLUCOSE, CAPILLARY
Glucose-Capillary: 150 mg/dL — ABNORMAL HIGH (ref 65–99)
Glucose-Capillary: 197 mg/dL — ABNORMAL HIGH (ref 65–99)
Glucose-Capillary: 266 mg/dL — ABNORMAL HIGH (ref 65–99)
Glucose-Capillary: 218 mg/dL — ABNORMAL HIGH (ref 65–99)

## 2016-07-12 MED ORDER — PROMETHAZINE HCL 12.5 MG RE SUPP
12.5000 mg | Freq: Four times a day (QID) | RECTAL | Status: DC | PRN
Start: 2016-07-12 — End: 2016-07-13
  Administered 2016-07-12: 12.5 mg via RECTAL
  Filled 2016-07-12 (×2): qty 1

## 2016-07-12 MED ORDER — ACETAMINOPHEN 325 MG PO TABS: 650.0000 mg | ORAL_TABLET | ORAL | Status: DC | PRN

## 2016-07-12 MED ORDER — BISACODYL 10 MG RE SUPP
10.0000 mg | Freq: Once | RECTAL | Status: AC
  Administered 2016-07-12: 10 mg via RECTAL
  Filled 2016-07-12: qty 1

## 2016-07-12 MED ORDER — ACETAMINOPHEN 650 MG RE SUPP: 650.0000 mg | RECTAL | Status: DC | PRN

## 2016-07-12 NOTE — Progress Notes (Signed)
IV team unable to gain access. Notified Dr. Bridgett Larsson. Ok to leave iv out.

## 2016-07-12 NOTE — Progress Notes (Signed)
[facility] Progress Note   Subjective:  Did well yest, threw up 2 hrs after breakfast this am  Objective Vitals:   07/11/16 1800 07/11/16 1941 07/12/16 0425 07/12/16 0830  BP: (!) 128/45 (!) 128/50 (!) 154/52 (!) 147/56  Pulse: 85 90 82 80  Resp:  20 18   Temp: 98.8 F (37.1 C) 98 F (36.7 C) 98.1 F (36.7 C)   TempSrc:  Oral Oral   SpO2: 100% 99% 100%   Weight:      Height:       Physical Exam General: chron ill appearing, no distress Heart: RRR; no murmur. Lungs: CTA bilat  Abdomen: soft ntnd +bs Extremities: Bandaged L foot, long intact/dry incision down L leg s/p procedure. Dialysis Access: LUE AVF + thrill  Additional Objective Labs: Basic Metabolic Panel:  Recent Labs Lab 07/10/16 0743 07/11/16 0338 07/12/16 0317  NA 136 132* 130*  K 5.1 3.8 4.7  CL 96* 93* 91*  CO2 23 25 25   GLUCOSE 200* 130* 177*  BUN 40* 18 29*  CREATININE 6.70* 4.57* 6.06*  CALCIUM 9.3 8.6* 8.4*   Liver Function Tests:  Recent Labs Lab 07/07/16 0915  AST 21  ALT 14  ALKPHOS 120  BILITOT 0.3  PROT 7.7  ALBUMIN 3.2*   CBC:  Recent Labs Lab 07/07/16 0915  07/09/16 0639 07/10/16 0743 07/11/16 0338 07/12/16 0317  WBC 10.9*  --  13.2* 20.7* 18.6* 15.3*  HGB 10.3*  < > 7.0* 8.7* 8.6* 8.2*  HCT 31.7*  < > 22.9* 27.5* 26.2* 25.3*  MCV 90.3  --  94.2 92.0 91.3 92.0  PLT 454*  --  312 370 296 322  < > = values in this interval not displayed. CBG:  Recent Labs Lab 07/11/16 1625 07/11/16 1822 07/11/16 2301 07/12/16 0620 07/12/16 1118  GLUCAP 214* 206* 152* 150* 197*   Studies/Results: Dg Abd Portable 1v  Result Date: 07/11/2016 CLINICAL DATA:  Nausea and vomiting for the past 4 days. EXAM: PORTABLE ABDOMEN - 1 VIEW COMPARISON:  07/09/2016. FINDINGS: Normal bowel gas pattern. Mildly prominent stool in the colon. Extensive arterial calcifications, including the abdominal aorta and its branches. Left common iliac artery stents. Left groin vascular  clips. Lumbar and lower thoracic spine degenerative changes. IMPRESSION: 1. No acute abnormality. 2. Mildly prominent stool. 3. Aortic atherosclerosis. Electronically Signed   By: Claudie Revering M.D.   On: 07/11/2016 13:47   Medications:  . sodium chloride   Intravenous Once  . amLODipine  10 mg Oral QHS  . aspirin  81 mg Oral QPM  . atorvastatin  20 mg Oral QHS  . carvedilol  25 mg Oral BID WC  . cinacalcet  30 mg Oral Q supper  . clopidogrel  75 mg Oral QPM  . docusate sodium  100 mg Oral Daily  . doxercalciferol  4 mcg Intravenous Q M,W,F-HD  . feeding supplement (NEPRO CARB STEADY)  237 mL Oral BID BM  . ferric citrate  630 mg Oral TID WC  . insulin aspart  0-5 Units Subcutaneous QHS  . insulin aspart  0-9 Units Subcutaneous TID WC  . insulin detemir  10 Units Subcutaneous QHS  . isosorbide-hydrALAZINE  1 tablet Oral BID  . multivitamin  1 tablet Oral QHS  . mupirocin ointment  1 application Topical BID  . pantoprazole  40 mg Oral Daily  . polyethylene glycol  17 g Oral Daily    Dialysis Orders: [facility] MWF   4h  63.5kg  3K/ 2.25 bath P4  Hep 2500 + 1000 midrun Mircera 50 mcg IV q 4 weeks (last dose 12/12/201 Last HGB 10) Venofer 50 mg IV weekly (last dose 07/07/16 Fe 29 Tsat 12%. Recent FE load) Hectoral 4 mcg IV q tx (Last PTH 453 05/20/16)  BMD medications: Auryxia 210 mg PO TID w/meals  Sensipar 60 mg PO q day with supper Last Phos 6.4 Ca 9.2 Ca 9.3  Assessment: 1. Gangrene L foot/ sp L fem-pop bypass 12/13 - per primary plan is for dc to rehab center 2. Nausea/ vomiting - constipated from narcotics, pt agreeable to dc of narcotics, use Tylenol for pain 3. ESRD - MWF hd 4. HTN - bp's good, cont meds 5. Vol - is at dry wt 6. Anemia: Hgb 8.7, s/p 1U PRBC on 12/14. Monitor. 7. Metabolic bone disease -  Cont binders, sensipar when eating. Cont VDRA.  8. Nutrition: Alb 3.2, adding Nepro supplements. 9. DM: On insulin, Per primary.  Plan - as above  Kelly Splinter  MD [employer] pager [phone]   07/12/2016, 11:45 AM

## 2016-07-12 NOTE — Progress Notes (Addendum)
   Progress Note  Subjective  - POD #4  No nausea or vomiting this am. Denies abdominal pain. Had small BM yesterday. Spouse states that his wife seems a bit "out of it" on the phone yesterday but still knows who she is.   Objective Vitals:   07/11/16 1941 07/12/16 0425  BP: (!) 128/50 (!) 154/52  Pulse: 90 82  Resp: 20 18  Temp: 98 F (36.7 C) 98.1 F (36.7 C)   No intake or output data in the 24 hours ending 07/12/16 0539  Alert and oriented to person, place, time and situation. Moving all extremities equally.  Left leg incisions clean and intact.  Left foot is warm. Dry gangrene left great toe and 4th toe.   Assessment/Planning: 65 y.o. female is s/p: Left femoral to below-knee popliteal bypass with 6 mm probe patent Gore-Tex graft 4 Days Post-Op   Nausea and vomiting resolved. Abdominal x-ray with mild stool burden. No obstruction. Suppository today. Miralax daily. Enema prn.  Hyponatremia: Na 130 today. Will have renal manage.  CSW for SNF placement.  ABLA: Hgb stable. Has received 2 units of prbcs post-op.  Continue to mobilize.  SNF when bed available.   Alvia Grove 07/12/2016 8:12 AM --  Laboratory CBC    Component Value Date/Time   WBC 15.3 (H) 07/12/2016 0317   HGB 8.2 (L) 07/12/2016 0317   HCT 25.3 (L) 07/12/2016 0317   PLT 322 07/12/2016 0317    BMET    Component Value Date/Time   NA 130 (L) 07/12/2016 0317   K 4.7 07/12/2016 0317   CL 91 (L) 07/12/2016 0317   CO2 25 07/12/2016 0317   GLUCOSE 177 (H) 07/12/2016 0317   BUN 29 (H) 07/12/2016 0317   CREATININE 6.06 (H) 07/12/2016 0317   CALCIUM 8.4 (L) 07/12/2016 0317   GFRNONAA 7 (L) 07/12/2016 0317   GFRAA 8 (L) 07/12/2016 0317    COAG Lab Results  Component Value Date   INR 1.03 07/07/2016   INR 1.02 10/06/2015   INR 1.05 06/13/2015   No results found for: PTT  Antibiotics Anti-infectives    Start     Dose/Rate Route Frequency Ordered Stop   07/09/16 0100  cefUROXime  (ZINACEF) 1.5 g in dextrose 5 % 50 mL IVPB     1.5 g 100 mL/hr over 30 Minutes Intravenous Every 12 hours 07/08/16 2044 07/09/16 1532   07/08/16 1036  dextrose 5 % with cefUROXime (ZINACEF) ADS Med    Comments:  Scronce, Trina   : cabinet override      07/08/16 1036 07/08/16 1345   07/08/16 1031  cefUROXime (ZINACEF) 1.5 g in dextrose 5 % 50 mL IVPB     1.5 g 100 mL/hr over 30 Minutes Intravenous 30 min pre-op 07/08/16 1031 07/08/16 [place], PA-C [facility] Office: [phone] Pager: [phone] 07/12/2016 8:12 AM  Addendum  I have independently interviewed and examined the patient, and I agree with the physician assistant's findings.  Pt is fully alert and awake today.  Exam is unchanged.  Awaiting SNF placement.  Dr. Donnetta Hutching will be back tomorrow.   Adele Barthel, MD, FACS [facility] [place] Office: [phone] Pager: [phone]  07/12/2016, 8:55 AM

## 2016-07-13 DIAGNOSIS — K5901 Slow transit constipation: Secondary | ICD-10-CM | POA: Diagnosis not present

## 2016-07-13 DIAGNOSIS — E785 Hyperlipidemia, unspecified: Secondary | ICD-10-CM | POA: Diagnosis not present

## 2016-07-13 DIAGNOSIS — E782 Mixed hyperlipidemia: Secondary | ICD-10-CM | POA: Diagnosis not present

## 2016-07-13 DIAGNOSIS — I96 Gangrene, not elsewhere classified: Secondary | ICD-10-CM | POA: Diagnosis not present

## 2016-07-13 DIAGNOSIS — E1121 Type 2 diabetes mellitus with diabetic nephropathy: Secondary | ICD-10-CM | POA: Diagnosis not present

## 2016-07-13 DIAGNOSIS — M6281 Muscle weakness (generalized): Secondary | ICD-10-CM | POA: Diagnosis not present

## 2016-07-13 DIAGNOSIS — E119 Type 2 diabetes mellitus without complications: Secondary | ICD-10-CM | POA: Diagnosis not present

## 2016-07-13 DIAGNOSIS — I110 Hypertensive heart disease with heart failure: Secondary | ICD-10-CM | POA: Diagnosis not present

## 2016-07-13 DIAGNOSIS — I5032 Chronic diastolic (congestive) heart failure: Secondary | ICD-10-CM | POA: Diagnosis not present

## 2016-07-13 DIAGNOSIS — N184 Chronic kidney disease, stage 4 (severe): Secondary | ICD-10-CM | POA: Diagnosis not present

## 2016-07-13 DIAGNOSIS — E1122 Type 2 diabetes mellitus with diabetic chronic kidney disease: Secondary | ICD-10-CM | POA: Diagnosis not present

## 2016-07-13 DIAGNOSIS — I120 Hypertensive chronic kidney disease with stage 5 chronic kidney disease or end stage renal disease: Secondary | ICD-10-CM | POA: Diagnosis not present

## 2016-07-13 DIAGNOSIS — D631 Anemia in chronic kidney disease: Secondary | ICD-10-CM | POA: Diagnosis not present

## 2016-07-13 DIAGNOSIS — E1129 Type 2 diabetes mellitus with other diabetic kidney complication: Secondary | ICD-10-CM | POA: Diagnosis not present

## 2016-07-13 DIAGNOSIS — R262 Difficulty in walking, not elsewhere classified: Secondary | ICD-10-CM | POA: Diagnosis not present

## 2016-07-13 DIAGNOSIS — I10 Essential (primary) hypertension: Secondary | ICD-10-CM | POA: Diagnosis not present

## 2016-07-13 DIAGNOSIS — N2581 Secondary hyperparathyroidism of renal origin: Secondary | ICD-10-CM | POA: Diagnosis not present

## 2016-07-13 DIAGNOSIS — M79606 Pain in leg, unspecified: Secondary | ICD-10-CM | POA: Diagnosis not present

## 2016-07-13 DIAGNOSIS — Z992 Dependence on renal dialysis: Secondary | ICD-10-CM | POA: Diagnosis not present

## 2016-07-13 DIAGNOSIS — I739 Peripheral vascular disease, unspecified: Secondary | ICD-10-CM | POA: Diagnosis not present

## 2016-07-13 DIAGNOSIS — N186 End stage renal disease: Secondary | ICD-10-CM | POA: Diagnosis not present

## 2016-07-13 DIAGNOSIS — Z48812 Encounter for surgical aftercare following surgery on the circulatory system: Secondary | ICD-10-CM | POA: Diagnosis not present

## 2016-07-13 DIAGNOSIS — R112 Nausea with vomiting, unspecified: Secondary | ICD-10-CM | POA: Diagnosis not present

## 2016-07-13 DIAGNOSIS — F411 Generalized anxiety disorder: Secondary | ICD-10-CM | POA: Diagnosis not present

## 2016-07-13 DIAGNOSIS — D509 Iron deficiency anemia, unspecified: Secondary | ICD-10-CM | POA: Diagnosis not present

## 2016-07-13 DIAGNOSIS — L03032 Cellulitis of left toe: Secondary | ICD-10-CM | POA: Diagnosis not present

## 2016-07-13 DIAGNOSIS — F4321 Adjustment disorder with depressed mood: Secondary | ICD-10-CM | POA: Diagnosis not present

## 2016-07-13 DIAGNOSIS — R2681 Unsteadiness on feet: Secondary | ICD-10-CM | POA: Diagnosis not present

## 2016-07-13 DIAGNOSIS — Z794 Long term (current) use of insulin: Secondary | ICD-10-CM | POA: Diagnosis not present

## 2016-07-13 DIAGNOSIS — D62 Acute posthemorrhagic anemia: Secondary | ICD-10-CM | POA: Diagnosis not present

## 2016-07-13 DIAGNOSIS — R5381 Other malaise: Secondary | ICD-10-CM | POA: Diagnosis not present

## 2016-07-13 DIAGNOSIS — R278 Other lack of coordination: Secondary | ICD-10-CM | POA: Diagnosis not present

## 2016-07-13 DIAGNOSIS — K219 Gastro-esophageal reflux disease without esophagitis: Secondary | ICD-10-CM | POA: Diagnosis not present

## 2016-07-13 LAB — CBC
HCT: 23 % — ABNORMAL LOW (ref 36.0–46.0)
Hemoglobin: 7.5 g/dL — ABNORMAL LOW (ref 12.0–15.0)
MCH: 29.9 pg (ref 26.0–34.0)
MCHC: 32.6 g/dL (ref 30.0–36.0)
MCV: 91.6 fL (ref 78.0–100.0)
Platelets: 297 10*3/uL (ref 150–400)
RBC: 2.51 MIL/uL — ABNORMAL LOW (ref 3.87–5.11)
RDW: 15.3 % (ref 11.5–15.5)
WBC: 3.3 10*3/uL — ABNORMAL LOW (ref 4.0–10.5)

## 2016-07-13 LAB — RENAL FUNCTION PANEL
Albumin: 2.6 g/dL — ABNORMAL LOW (ref 3.5–5.0)
Anion gap: 15 (ref 5–15)
BUN: 41 mg/dL — ABNORMAL HIGH (ref 6–20)
CO2: 25 mmol/L (ref 22–32)
Calcium: 8.6 mg/dL — ABNORMAL LOW (ref 8.9–10.3)
Chloride: 89 mmol/L — ABNORMAL LOW (ref 101–111)
Creatinine, Ser: 7.89 mg/dL — ABNORMAL HIGH (ref 0.44–1.00)
GFR calc Af Amer: 6 mL/min — ABNORMAL LOW (ref >60)
GFR calc non Af Amer: 5 mL/min — ABNORMAL LOW (ref >60)
Glucose, Bld: 90 mg/dL (ref 65–99)
Phosphorus: 4.3 mg/dL (ref 2.5–4.6)
Potassium: 5 mmol/L (ref 3.5–5.1)
Sodium: 129 mmol/L — ABNORMAL LOW (ref 135–145)

## 2016-07-13 LAB — GLUCOSE, CAPILLARY
Glucose-Capillary: 95 mg/dL (ref 65–99)
Glucose-Capillary: 81 mg/dL (ref 65–99)

## 2016-07-13 LAB — HEMOGLOBIN AND HEMATOCRIT, BLOOD
HCT: 22.6 % — ABNORMAL LOW (ref 36.0–46.0)
Hemoglobin: 7.5 g/dL — ABNORMAL LOW (ref 12.0–15.0)

## 2016-07-13 LAB — PREPARE RBC (CROSSMATCH)

## 2016-07-13 MED ORDER — INSULIN ASPART 100 UNIT/ML ~~LOC~~ SOLN: 0.0000 [IU] | Freq: Three times a day (TID) | SUBCUTANEOUS | 11 refills | Status: DC

## 2016-07-13 MED ORDER — CLOPIDOGREL BISULFATE 75 MG PO TABS: 75.0000 mg | ORAL_TABLET | Freq: Every evening | ORAL | Status: DC

## 2016-07-13 MED ORDER — SODIUM CHLORIDE 0.9 % IV SOLN: Freq: Once | INTRAVENOUS | Status: DC

## 2016-07-13 MED ORDER — NEPRO/CARBSTEADY PO LIQD: 237.0000 mL | Freq: Two times a day (BID) | ORAL | 0 refills | Status: DC

## 2016-07-13 MED ORDER — OXYCODONE-ACETAMINOPHEN 5-325 MG PO TABS: 1.0000 | ORAL_TABLET | Freq: Three times a day (TID) | ORAL | 0 refills | Status: DC | PRN

## 2016-07-13 MED ORDER — DOXERCALCIFEROL 4 MCG/2ML IV SOLN
INTRAVENOUS | Status: AC
  Filled 2016-07-13: qty 2

## 2016-07-13 NOTE — Procedures (Signed)
Tol HD.  Goal was to keep even, but will increase to -1L. BP ok. LLE post op swelling. For SNF. Delorice Bannister C

## 2016-07-13 NOTE — Discharge Summary (Signed)
[facility] Discharge Summary  Destiny Day 15-Sep-1950 65 y.o. female  [id number]  Admission Date: 07/08/2016  Discharge Date: 07/13/2016  Physician: Rosetta Posner, MD  Admission Diagnosis: Peripheral vascular disease with left lower extremity rest pain I70.222  HPI:   This is a 65 y.o. female who is s/p angiogram with iliac stent placement in both common and external iliacs.  Since her procedure she has now developed a 4th and 5th digit "kissing " ulcer between her toes on the left.  She is here to discuss further revascularization intervention with Dr. Donnetta Hutching.  She is referred by Dr. Sharol Given for evaluation of lower from the arterial insufficiency left leg. She is a very pleasant 65 year old female with end-stage renal disease. She has a long history of insulin-dependent diabetes. She has a ulceration on the distal tip of her left great toe and x-rays showed osteomyelitis. Dr. Sharol Given is seen her for a possible toe amputation and referred her for evaluation of adequate arterial flow. She does have symptoms consistent with calf claudication bilaterally which is been present for a great period of time. She has never had any other tissue loss. Does have pain consistent with rest pain in her left foot as well.  Hospital Course:  The patient was admitted to the hospital and taken to the operating room on 07/08/2016 and underwent: Left femoral to below-knee popliteal bypass with 6 mm propaten Gore-Tex graft    The patient tolerated the procedure well and was transported to the PACU in  stable condition.   Postoperatively, the patient had a palpable posterior tibial pulse. Her incisions were healing well without hematoma. She did complain of some nausea. She was transfused 2 units of packed red blood cells for acute surgical blood loss anemia. Dr. Sharol Given was asked to see her regarding her toe gangrene. Nephrology was consult for dialysis.  Postoperative ABIs 07/09/2016  ABI  completed: Unable to obtain left brachial pressure due to arteriovenous fistula. 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 Unable to obtain TBI's due to toe ulceration and patient movement. Left sided femoral to popliteal artery bypass graft was placed on 07/08/16. Compared to the previous ABI obtained on 06/08/16, right ABI has diminished from 0.56. Left ABI has improved from 0.46.   RIGHT   LEFT    PRESSURE WAVEFORM  PRESSURE WAVEFORM  BRACHIAL 140 Biphasic BRACHIAL AVF   DP 48 Monophasic DP 88 Monophasic  PT 43 Monophasic PT 95 Monophasic    RIGHT LEFT  ABI 0.34 0.68   She continued to have nausea on postop day 2. She was continued on a clear liquid diet due to this.  Dr. Sharol Given evaluated her on 07/10/2016. He recommended observation of her toes to see how much she would recover with improved circulation. He will follow-up with her 2 weeks after discharge from the skilled nursing facility.  The remainder of her hospital course included resultant of nausea and vomiting. She required a laxative and suppository for constipation. Physical therapy and occupational therapy recommended SNF placement. Social work was consulted for SNF placement. Given the lack of support at home, the patient was agreeable to this. By postop day 5, she continued to have acute blood loss anemia but was asymptomatic. This was monitored per renal recommendations. She was discharged to a skilled nursing facility on postop day 5 in good condition.  CBC  Component Value Date/Time   WBC 3.3 (L) 07/13/2016 0652   RBC 2.51 (L) 07/13/2016 0652   HGB 7.5 (L) 07/13/2016 0852   HCT 22.6 (L) 07/13/2016 0852   PLT 297 07/13/2016 0652   MCV 91.6 07/13/2016 0652   MCH 29.9 07/13/2016 0652   MCHC 32.6 07/13/2016 0652   RDW 15.3 07/13/2016 0652   LYMPHSABS 2.5 10/12/2015 1600   MONOABS 0.9  10/12/2015 1600   EOSABS 0.2 10/12/2015 1600   BASOSABS 0.0 10/12/2015 1600    BMET    Component Value Date/Time   NA 129 (L) 07/13/2016 0652   K 5.0 07/13/2016 0652   CL 89 (L) 07/13/2016 0652   CO2 25 07/13/2016 0652   GLUCOSE 90 07/13/2016 0652   BUN 41 (H) 07/13/2016 0652   CREATININE 7.89 (H) 07/13/2016 0652   CALCIUM 8.6 (L) 07/13/2016 0652   GFRNONAA 5 (L) 07/13/2016 0652   GFRAA 6 (L) 07/13/2016 0652     Discharge Instructions:   The patient is discharged to SNF with extensive instructions on wound care and progressive ambulation.  They are instructed not to drive or perform any heavy lifting until returning to see the physician in his office.  Discharge Instructions    Call MD for:  redness, tenderness, or signs of infection (pain, swelling, bleeding, redness, odor or green/yellow discharge around incision site)    Complete by:  As directed    Call MD for:  severe or increased pain, loss or decreased feeling  in affected limb(s)    Complete by:  As directed    Call MD for:  temperature >100.5    Complete by:  As directed    Discharge wound care:    Complete by:  As directed    Wash the groin wound with soap and water daily and pat dry. (No tub bath-only shower)  Then put a dry gauze or washcloth there to keep this area dry daily and as needed.  Do not use Vaseline or neosporin on your incisions.  Only use soap and water on your incisions and then protect and keep dry.  Wash remaining incisions daily with soap and water and pat dry. Do not pull off the skin glue, it will peel off on its own.   Driving Restrictions    Complete by:  As directed    No driving for 3 weeks or until cleared by Dr. Donnetta Hutching.   Increase activity slowly    Complete by:  As directed    Walk with assistance use walker or cane as needed   Lifting restrictions    Complete by:  As directed    No heavy lifting for 2 weeks   Resume previous diet    Complete by:  As directed       Discharge  Diagnosis:  Peripheral vascular disease with left lower extremity rest pain I70.222  Secondary Diagnosis: Patient Active Problem List   Diagnosis Date Noted  . Gangrene of toe of left foot ([place])   . PAD (peripheral artery disease) ([place]) 07/08/2016  . Atherosclerosis of native artery of left lower extremity with gangrene ([place]) 06/23/2016  . Acute pulmonary edema ([place]) 10/07/2015  . GERD (gastroesophageal reflux disease) 10/07/2015  . ARF (acute renal failure) ([name])   . Hypothermia 06/12/2015  . Acute on chronic renal failure ([place]) 06/12/2015  . CHF (congestive heart failure) ([place]) 07/30/2014  . CKD (chronic kidney disease) stage 4, GFR 15-29 ml/min (H[facility]) 06/27/2014  . Acute exacerbation of CHF (congestive  heart failure) ([place]) 01/10/2014  . Hypertensive heart disease 05/25/2013  . CKD (chronic kidney disease) stage 3, GFR 30-59 ml/min 05/25/2013  . Malignant hypertension 05/23/2013  . Acute respiratory failure ([place]) 05/23/2013  . Hypertensive urgency 05/23/2013  . Pulmonary edema 05/23/2013  . Type 2 diabetes mellitus with diabetic nephropathy ([place]) 05/23/2013  . Type 2 diabetes mellitus with hyperosmolar nonketotic hyperglycemia ([place]) 04/13/2013  . Chronic diastolic CHF (congestive heart failure) ([place]) 04/13/2013  . UGI bleed 03/31/2013  . Hypokalemia 08/24/2012  . Diastolic CHF, acute on chronic ([place]) 08/22/2011  . HTN (hypertension), malignant 08/22/2011  . Type II or unspecified type diabetes mellitus without mention of complication, not stated as uncontrolled 12/27/2007  . HLD (hyperlipidemia) 12/27/2007  . Essential hypertension 12/27/2007  . ALLERGIC RHINITIS 12/27/2007  . Asthma 12/27/2007   Past Medical History:  Diagnosis Date  . Abdominal bruit   . Anemia   . Anxiety   . Arthritis    Osteoarthritis  . Asthma   . Cervical disc disease    "pinced nerve"  . CHF (congestive heart failure) ([place])   . Complication of anesthesia    " after I got home from my last  procedure, I started itching."  . Diabetes mellitus    insulin dependent  . Diverticulitis   . ESRD on peritoneal dialysis [facility])    Hemodialysis - MWF  . GERD (gastroesophageal reflux disease)    from medications  . GI bleed 03/31/2013  . History of hiatal hernia   . Hyperlipidemia   . Hypertension   . Osteoporosis   . Pneumonia   . Seasonal allergies   . Shortness of breath dyspnea    WIth exertion  . Sleep apnea    can't afford cpap     Allergies as of 07/13/2016      Reactions   Lisinopril Cough   Prednisone Other (See Comments)   Excessive fluid buildup      Medication List    STOP taking these medications   CEFOXITIN SODIUM IV   traMADol 50 MG tablet Commonly known as:  ULTRAM   VANCOMYCIN HCL IV     TAKE these medications   albuterol 108 (90 Base) MCG/ACT inhaler Commonly known as:  PROVENTIL HFA;VENTOLIN HFA Inhale 1-2 puffs into the lungs every 6 (six) hours as needed for wheezing or shortness of breath.   amLODipine 10 MG tablet Commonly known as:  NORVASC Take 10 mg by mouth at bedtime.   aspirin 81 MG chewable tablet Chew 81 mg by mouth every evening.   atorvastatin 20 MG tablet Commonly known as:  LIPITOR Take 20 mg by mouth at bedtime.   AURYXIA 1 GM 210 MG(Fe) tablet Generic drug:  ferric citrate Take 630 mg by mouth 3 (three) times daily with meals.   budesonide-formoterol 160-4.5 MCG/ACT inhaler Commonly known as:  SYMBICORT Inhale 1 puff into the lungs every evening.   carvedilol 12.5 MG tablet Commonly known as:  COREG Take 2 tablets (25 mg total) by mouth 2 (two) times daily with a meal.   cinacalcet 30 MG tablet Commonly known as:  SENSIPAR Take 30 mg by mouth every evening.   clopidogrel 75 MG tablet Commonly known as:  PLAVIX Take 1 tablet (75 mg total) by mouth every evening.   feeding supplement (NEPRO CARB STEADY) Liqd Take 237 mLs by mouth 2 (two) times daily between meals. Start taking on:  07/14/2016     fluticasone 50 MCG/ACT nasal spray Commonly known as:  FLONASE  Place 1 spray into both nostrils daily as needed for allergies or rhinitis.   insulin aspart 100 UNIT/ML injection Commonly known as:  novoLOG Inject 0-8 Units into the skin 3 (three) times daily with meals. Sliding scale What changed:  when to take this   isosorbide-hydrALAZINE 20-37.5 MG tablet Commonly known as:  BIDIL Take 1 tablet by mouth 2 (two) times daily.   LEVEMIR FLEXTOUCH 100 UNIT/ML Pen Generic drug:  Insulin Detemir Inject 10 Units into the muscle at bedtime.   loratadine 10 MG tablet Commonly known as:  CLARITIN Take 10 mg by mouth daily as needed for allergies.   multivitamin Tabs tablet Take 1 tablet by mouth every evening.   mupirocin ointment 2 % Commonly known as:  BACTROBAN Apply 1 application topically 2 (two) times daily.   omeprazole 20 MG capsule Commonly known as:  PRILOSEC Take 20 mg by mouth daily.   oxyCODONE-acetaminophen 5-325 MG tablet Commonly known as:  PERCOCET/ROXICET Take 1 tablet by mouth every 8 (eight) hours as needed for moderate pain.   Vitamin D (Ergocalciferol) 50000 units Caps capsule Commonly known as:  DRISDOL Take 50,000 Units by mouth every 7 (seven) days. Mondays       Percocet #15 No Refill  Disposition: SNF  Patient's condition: is Good  Follow up: 1. Dr. Donnetta Hutching in 2 weeks 2. Dr. Sharol Given 2 weeks after SNF discharge.    Virgina Jock, PA-C [facility] [phone] 07/13/2016  2:22 PM  - For VQI Registry use --- Instructions: Press F2 to tab through selections.  Delete question if not applicable.   Post-op:  Wound infection: No  Graft infection: No  Transfusion: Yes  If yes, 1 unit given New Arrhythmia: No Ipsilateral amputation: No, [ ]  Minor, [ ]  BKA, [ ]  AKA Discharge patency: [x ] Primary, [ ]  Primary assisted, [ ]  Secondary, [ ]  Occluded Patency judged by: [ ]  Dopper only, [ ]  Palpable graft pulse, [x ] Palpable  distal pulse, [ ]  ABI inc. > 0.15, [ ]  Duplex Discharge ABI: R 0.34, L 0.68 D/C Ambulatory Status: Ambulatory with Assistance  Complications: MI: No, [ ]  Troponin only, [ ]  EKG or Clinical CHF: No Resp failure:No, [ ]  Pneumonia, [ ]  Ventilator Chg in renal function: No, [ ]  Inc. Cr > 0.5, [ ]  Temp. Dialysis, [ ]  Permanent dialysis Stroke: No, [ ]  Minor, [ ]  Major Return to OR: No  Reason for return to OR: [ ]  Bleeding, [ ]  Infection, [ ]  Thrombosis, [ ]  Revision  Discharge medications: Statin use:  yes ASA use:  yes Plavix use:  yes Beta blocker use: no Coumadin use: no

## 2016-07-13 NOTE — Clinical Social Work Placement (Addendum)
   CLINICAL SOCIAL WORK PLACEMENT  NOTE  Date:  07/13/2016  Patient Details  Name: Destiny Day MRN: [id number] Date of Birth: 01-14-1951  Clinical Social Work is seeking post-discharge placement for this patient at the [place] level of care (*CSW will initial, date and re-position this form in  chart as items are completed):      Patient/family provided with [place] Work Department's list of facilities offering this level of care within the geographic area requested by the patient (or if unable, by the patient's family).  Yes   Patient/family informed of their freedom to choose among providers that offer the needed level of care, that participate in Medicare, Medicaid or managed care program needed by the patient, have an available bed and are willing to accept the patient.      Patient/family informed of [facility]'s ownership interest in [facility] and [facility], as well as of the fact that they are under no obligation to receive care at these facilities.  PASRR submitted to EDS on       PASRR number received on 07/13/16     Existing PASRR number confirmed on       FL2 transmitted to all facilities in geographic area requested by pt/family on 07/13/16     FL2 transmitted to all facilities within larger geographic area on       Patient informed that his/her managed care company has contracts with or will negotiate with certain facilities, including the following:        Yes   Patient/family informed of bed offers received.  Patient chooses bed at [facility].     Physician recommends and patient chooses bed at      Patient to be transferred to [facility] on 07/13/16.  Patient to be transferred to facility by PT[facility]     Patient family notified on 07/13/16 of transfer.  Name of family member notified:  [name]     PHYSICIAN Please prepare priority discharge summary, including medications, Please prepare  prescriptions, Please sign FL2     Additional Comment:    _______________________________________________ Alla German, LCSW 07/13/2016, 1:56 PM

## 2016-07-13 NOTE — Clinical Social Work Note (Signed)
Clinical Social Work Assessment  Patient Details  Name: Destiny Day MRN: [id number] Date of Birth: [birth year]  Date of referral:  07/13/16               Reason for consult:  Facility Placement                Permission sought to share information with:  Family Supports Permission granted to share information::  Yes, Verbal Permission Granted  Name::     [name]  Agency::     Relationship::  Spouse  Contact Information:  [phone]  Housing/Transportation Living arrangements for the past 2 months:  [place] of Information:  Patient, Spouse Patient Interpreter Needed:  None Criminal Activity/Legal Involvement Pertinent to Current Situation/Hospitalization:  No - Comment as needed Significant Relationships:  Spouse Lives with:  Spouse Do you feel safe going back to the place where you live?  Yes Need for family participation in patient care:  Yes (Comment)  Care giving concerns:  Pt spouse present at bedside during initial assessment. Pt spouse denies any concern at this time.    Social Worker assessment / plan:  CSW spoke with pt and pt's spouse at bedside to complete initial assessment. Pt lives at home with spouse. Pt is agreeable to SNF placement at this time. Pt prefers [facility] CSW will reach out to facility and follow up with pt.   Employment status:  Retired Nurse, adult PT Recommendations:  [place] / Referral to community resources:  [place]  Patient/Family's Response to care:  Pt verbalized understanding of CSW role and appreciation of support. Pt and pt's spouse denies any concern regarding pt care at this time.  Patient/Family's Understanding of and Emotional Response to Diagnosis, Current Treatment, and Prognosis:  Pt understanding and realistic regarding physical limitations and the need for rehab at d/c. Pt agreeable to SNF placement at d/c. Pt denies any concern  regarding treatment plan at this time. CSW will continue to provide support to pt and pt's family.   Emotional Assessment Appearance:  Appears stated age Attitude/Demeanor/Rapport:   (Patient was appropriate) Affect (typically observed):  Accepting, Appropriate Orientation:  Oriented to Self, Oriented to Place, Oriented to Situation, Oriented to  Time Alcohol / Substance use:  Not Applicable Psych involvement (Current and /or in the community):  No (Comment)  Discharge Needs  Concerns to be addressed:  No discharge needs identified Readmission within the last 30 days:  Yes Current discharge risk:  Dependent with Mobility Barriers to Discharge:  Continued Medical Work up   [employer], LCSW 07/13/2016, 1:52 PM

## 2016-07-13 NOTE — Progress Notes (Signed)
Order received to discharge/transfer patient.  Patient expresses readiness to discharge.  Telemetry monitor was removed and CCMD notified.  Patient has no PIV access at this time.  Patient is transferred without difficulty via [facility] to [facility] for rehab.

## 2016-07-13 NOTE — Progress Notes (Signed)
PT Cancellation Note  Patient Details Name: Destiny Day MRN: [id number] DOB: 22-Jan-1951   Cancelled Treatment:    Reason Eval/Treat Not Completed: Patient at procedure or test/unavailable. Pt off the floor at HD. PT to return as able.   Destiny Day M Destiny Day 07/13/2016, 7:26 AM   Destiny Day, PT, DPT Pager #: [phone] Office #: [phone]

## 2016-07-13 NOTE — Progress Notes (Signed)
Report was called from Hemodialysis before patient returned.  It was reported that they removed 1 liter of fluid and gave the patient 1 unit of blood during dialyis.

## 2016-07-13 NOTE — [place] FL2 (Addendum)
[place] MEDICAID FL2 LEVEL OF CARE SCREENING TOOL     IDENTIFICATION  Patient Name: Destiny Day Birthdate: 09-Jan-1951 Sex: female Admission Date (Current Location): 07/08/2016  [facility] and [place] Number:  Herbalist and Address:  [facility] [place], [place] [address], [place], [place] [address]      Provider Number: [id number]  Attending Physician Name and Address:  Rosetta Posner, MD  Relative Name and Phone Number:       Current Level of Care: Hospital Recommended Level of Care: [place] Prior Approval Number:    Date Approved/Denied: 07/13/16 PASRR Number: [id number]   Discharge Plan: SNF    Current Diagnoses: Patient Active Problem List   Diagnosis Date Noted  . Gangrene of toe of left foot ([place])   . PAD (peripheral artery disease) ([place]) 07/08/2016  . Atherosclerosis of native artery of left lower extremity with gangrene ([place]) 06/23/2016  . Acute pulmonary edema ([place]) 10/07/2015  . GERD (gastroesophageal reflux disease) 10/07/2015  . ARF (acute renal failure) ([place])   . Hypothermia 06/12/2015  . Acute on chronic renal failure ([place]) 06/12/2015  . CHF (congestive heart failure) ([place]) 07/30/2014  . CKD (chronic kidney disease) stage 4, GFR 15-29 ml/min (HCC) 06/27/2014  . Acute exacerbation of CHF (congestive heart failure) ([place]) 01/10/2014  . Hypertensive heart disease 05/25/2013  . CKD (chronic kidney disease) stage 3, GFR 30-59 ml/min 05/25/2013  . Malignant hypertension 05/23/2013  . Acute respiratory failure ([place]) 05/23/2013  . Hypertensive urgency 05/23/2013  . Pulmonary edema 05/23/2013  . Type 2 diabetes mellitus with diabetic nephropathy ([place]) 05/23/2013  . Type 2 diabetes mellitus with hyperosmolar nonketotic hyperglycemia ([place]) 04/13/2013  . Chronic diastolic CHF (congestive heart failure) ([place]) 04/13/2013  . UGI bleed 03/31/2013  . Hypokalemia 08/24/2012  . Diastolic CHF, acute on chronic ([place])  08/22/2011  . HTN (hypertension), malignant 08/22/2011  . Type II or unspecified type diabetes mellitus without mention of complication, not stated as uncontrolled 12/27/2007  . HLD (hyperlipidemia) 12/27/2007  . Essential hypertension 12/27/2007  . ALLERGIC RHINITIS 12/27/2007  . Asthma 12/27/2007    Orientation RESPIRATION BLADDER Height & Weight     Time, Situation, Place, Self  Normal Continent Weight: 113 lb 5.1 oz (51.4 kg) Height:  5\' 5"  (165.1 cm)  BEHAVIORAL SYMPTOMS/MOOD NEUROLOGICAL BOWEL NUTRITION STATUS         (Please see d/c summary)  AMBULATORY STATUS COMMUNICATION OF NEEDS Skin   Extensive Assist Verbally Skin abrasions, Surgical wounds (Closed incision left groin. Closed incision left thigh.  Closed incision left leg. Diabetic ulcer on left toe)                       Personal Care Assistance Level of Assistance  Feeding, Dressing, Bathing Bathing Assistance: Maximum assistance Feeding assistance: Limited assistance Dressing Assistance: Maximum assistance     Functional Limitations Info  Sight, Hearing, Speech Sight Info: Adequate Hearing Info: Adequate Speech Info: Adequate    SPECIAL CARE FACTORS FREQUENCY  PT (By licensed PT), OT (By licensed OT)     PT Frequency: min 3x week OT Frequency: min 3x week            Contractures Contractures Info: Not present    Additional Factors Info  Code Status, Allergies Code Status Info: Full Allergies Info: Lisinophril, Prednisone           Current Medications (07/13/2016):  This is the current hospital active medication list Current  Facility-Administered Medications  Medication Dose Route Frequency Provider Last Rate Last Dose  . 0.9 %  sodium chloride infusion  500 mL Intravenous Once PRN Samantha J Rhyne, PA-C      . 0.9 %  sodium chloride infusion   Intravenous Once Ulyses Amor, PA-C 10 mL/hr at 07/09/16 1800    . 0.9 %  sodium chloride infusion   Intravenous Once Estanislado Emms, MD       . acetaminophen (TYLENOL) tablet 650 mg  650 mg Oral Q4H PRN Roney Jaffe, MD       Or  . acetaminophen (TYLENOL) suppository 650 mg  650 mg Rectal Q4H PRN Roney Jaffe, MD      . albuterol (PROVENTIL) (2.5 MG/3ML) 0.083% nebulizer solution 3 mL  3 mL Inhalation Q6H PRN Samantha J Rhyne, PA-C      . amLODipine (NORVASC) tablet 10 mg  10 mg Oral QHS Hulen Shouts Rhyne, PA-C   10 mg at 07/12/16 2232  . aspirin chewable tablet 81 mg  81 mg Oral QPM Samantha J Rhyne, PA-C   81 mg at 07/12/16 1855  . atorvastatin (LIPITOR) tablet 20 mg  20 mg Oral QHS Samantha J Rhyne, PA-C   20 mg at 07/12/16 2231  . bisacodyl (DULCOLAX) suppository 10 mg  10 mg Rectal Daily PRN Ulyses Amor, PA-C   10 mg at 07/10/16 0342  . carvedilol (COREG) tablet 25 mg  25 mg Oral BID WC Samantha J Rhyne, PA-C   25 mg at 07/12/16 1755  . cinacalcet (SENSIPAR) tablet 30 mg  30 mg Oral Q supper Hulen Shouts Rhyne, PA-C   30 mg at 07/12/16 1755  . clopidogrel (PLAVIX) tablet 75 mg  75 mg Oral QPM Samantha J Rhyne, PA-C   75 mg at 07/12/16 1855  . docusate sodium (COLACE) capsule 100 mg  100 mg Oral Daily Samantha J Rhyne, PA-C   100 mg at 07/13/16 1329  . doxercalciferol (HECTOROL) injection 4 mcg  4 mcg Intravenous Q M,W,F-HD Valentina Gu, NP   4 mcg at 07/13/16 1048  . feeding supplement (NEPRO CARB STEADY) liquid 237 mL  237 mL Oral BID BM [place] Ganja Stovall, PA-C   237 mL at 07/12/16 1303  . ferric citrate (AURYXIA) tablet 630 mg  630 mg Oral TID WC Samantha J Rhyne, PA-C   630 mg at 07/13/16 1327  . fluticasone (FLONASE) 50 MCG/ACT nasal spray 1 spray  1 spray Each Nare Daily PRN Samantha J Rhyne, PA-C      . guaiFENesin-dextromethorphan (ROBITUSSIN DM) 100-10 MG/5ML syrup 15 mL  15 mL Oral Q4H PRN Samantha J Rhyne, PA-C      . hydrALAZINE (APRESOLINE) injection 5 mg  5 mg Intravenous Q20 Min PRN Hulen Shouts Rhyne, PA-C   5 mg at 07/10/16 0406  . insulin aspart (novoLOG) injection 0-5 Units  0-5 Units Subcutaneous QHS  Conrad Bridgeton, MD   2 Units at 07/12/16 2228  . insulin aspart (novoLOG) injection 0-9 Units  0-9 Units Subcutaneous TID WC Conrad [place], MD   5 Units at 07/12/16 1855  . insulin detemir (LEVEMIR) injection 10 Units  10 Units Subcutaneous QHS Gabriel Earing, PA-C   10 Units at 07/12/16 2228  . isosorbide-hydrALAZINE (BIDIL) 20-37.5 MG per tablet 1 tablet  1 tablet Oral BID Hulen Shouts Rhyne, PA-C   1 tablet at 07/13/16 1328  . labetalol (NORMODYNE,TRANDATE) injection 10 mg  10 mg Intravenous Q10 min PRN Hulen Shouts  Rhyne, PA-C      . loratadine (CLARITIN) tablet 10 mg  10 mg Oral Daily PRN Samantha J Rhyne, PA-C      . metoprolol (LOPRESSOR) injection 2-5 mg  2-5 mg Intravenous Q2H PRN Samantha J Rhyne, PA-C      . multivitamin (RENA-VIT) tablet 1 tablet  1 tablet Oral QHS Hulen Shouts Rhyne, PA-C   1 tablet at 07/12/16 2231  . mupirocin ointment (BACTROBAN) 2 % 1 application  1 application Topical BID Gabriel Earing, PA-C   1 application at 73/42/87 1329  . ondansetron (ZOFRAN) injection 4 mg  4 mg Intravenous Q6H PRN Hulen Shouts Rhyne, PA-C   4 mg at 07/12/16 0839  . pantoprazole (PROTONIX) EC tablet 40 mg  40 mg Oral Daily Samantha J Rhyne, PA-C   40 mg at 07/13/16 1327  . phenol (CHLORASEPTIC) mouth spray 1 spray  1 spray Mouth/Throat PRN Samantha J Rhyne, PA-C      . polyethylene glycol (MIRALAX / GLYCOLAX) packet 17 g  17 g Oral Daily PRN Hulen Shouts Rhyne, PA-C   17 g at 07/12/16 1303  . polyethylene glycol (MIRALAX / GLYCOLAX) packet 17 g  17 g Oral Daily Conrad [place], MD   17 g at 07/13/16 1327  . promethazine (PHENERGAN) injection 12.5 mg  12.5 mg Intravenous Q6H PRN Ulyses Amor, PA-C   12.5 mg at 07/09/16 1238  . promethazine (PHENERGAN) suppository 12.5 mg  12.5 mg Rectal Q6H PRN Conrad [place], MD   12.5 mg at 07/12/16 1303  . traMADol (ULTRAM) tablet 50 mg  50 mg Oral Q12H PRN Ulyses Amor, PA-C   50 mg at 07/12/16 2231     Discharge Medications: Please see discharge summary for a  list of discharge medications.  Relevant Imaging Results:  Relevant Lab Results:   Additional Information SSN: [id number]  Alla German, LCSW

## 2016-07-13 NOTE — Progress Notes (Addendum)
  [facility] Progress Note  Subjective  - POD #5  Patient seen in HD. Denies nausea and vomiting. Had BM yesterday.   Objective Vitals:   07/13/16 0527 07/13/16 0725  BP: (!) 148/54 (!) 170/73  Pulse: 80 83  Resp: 18 15  Temp: 98.1 F (36.7 C)    No intake or output data in the 24 hours ending 07/13/16 0751  Left leg incisions healing well.  Left foot warm with mild edema around ankle. Dry gangrene left great toe and left 4th toe.  Left upper arm AVF in use.   Assessment/Planning: 65 y.o. female is s/p: Left femoral to below-knee popliteal bypass with 6 mm propaten Gore-Tex graft 5 Days Post-Op   No further nausea and vomiting.  CSW for SNF placement.  Needs to mobilize today.  Ok to d/c to SNF when bed available.   Alvia Grove 07/13/2016 7:51 AM --  Laboratory CBC    Component Value Date/Time   WBC 15.3 (H) 07/12/2016 0317   HGB 8.2 (L) 07/12/2016 0317   HCT 25.3 (L) 07/12/2016 0317   PLT 322 07/12/2016 0317    BMET    Component Value Date/Time   NA 130 (L) 07/12/2016 0317   K 4.7 07/12/2016 0317   CL 91 (L) 07/12/2016 0317   CO2 25 07/12/2016 0317   GLUCOSE 177 (H) 07/12/2016 0317   BUN 29 (H) 07/12/2016 0317   CREATININE 6.06 (H) 07/12/2016 0317   CALCIUM 8.4 (L) 07/12/2016 0317   GFRNONAA 7 (L) 07/12/2016 0317   GFRAA 8 (L) 07/12/2016 0317    COAG Lab Results  Component Value Date   INR 1.03 07/07/2016   INR 1.02 10/06/2015   INR 1.05 06/13/2015   No results found for: PTT  Antibiotics Anti-infectives    Start     Dose/Rate Route Frequency Ordered Stop   07/09/16 0100  cefUROXime (ZINACEF) 1.5 g in dextrose 5 % 50 mL IVPB     1.5 g 100 mL/hr over 30 Minutes Intravenous Every 12 hours 07/08/16 2044 07/09/16 1532   07/08/16 1036  dextrose 5 % with cefUROXime (ZINACEF) ADS Med    Comments:  Scronce, Trina   : cabinet override      07/08/16 1036 07/08/16 1345   07/08/16 1031  cefUROXime (ZINACEF) 1.5 g in dextrose 5  % 50 mL IVPB     1.5 g 100 mL/hr over 30 Minutes Intravenous 30 min pre-op 07/08/16 1031 07/08/16 [place], PA-C [facility] Office: [phone] Pager: [phone] 07/13/2016 7:51 AM  I have examined the patient, reviewed and agree with above.Still with acute post op blood loss anemia.  Will defer transfusion decision to renal.  Last note recommended to follow  Curt Jews, MD 07/13/2016 11:19 AM

## 2016-07-13 NOTE — Clinical Social Work Note (Signed)
Clinical Social Worker facilitated patient discharge including contacting patient family and facility to confirm patient discharge plans.  Clinical information faxed to facility and family agreeable with plan.  CSW arranged ambulance transport via [facility] to [facility] .  RN to call [phone] for report prior to discharge. Pt will be going to room 702 at [facility].  Clinical Social Worker will sign off for now as social work intervention is no longer needed. Please consult [place] again if new need arises.  [address], [place]

## 2016-07-13 NOTE — Progress Notes (Signed)
Report called to [place] at [facility]

## 2016-07-14 ENCOUNTER — Non-Acute Institutional Stay (SKILLED_NURSING_FACILITY): Payer: Medicare Other | Admitting: Family

## 2016-07-14 DIAGNOSIS — R112 Nausea with vomiting, unspecified: Secondary | ICD-10-CM | POA: Diagnosis not present

## 2016-07-14 DIAGNOSIS — K5901 Slow transit constipation: Secondary | ICD-10-CM

## 2016-07-14 DIAGNOSIS — F411 Generalized anxiety disorder: Secondary | ICD-10-CM

## 2016-07-14 DIAGNOSIS — T82392A Other mechanical complication of femoral arterial graft (bypass), initial encounter: Secondary | ICD-10-CM | POA: Insufficient documentation

## 2016-07-14 LAB — TYPE AND SCREEN
ABO/RH(D): B POS
Antibody Screen: NEGATIVE
Unit division: 0

## 2016-07-14 NOTE — Progress Notes (Signed)
Location:  [place] Room Number: [place]:  SNF [phone]) Provider:  Lilibeth Opie FNP-C   Velna Hatchet, MD  Patient Care Team: Velna Hatchet, MD as PCP - General (Internal Medicine) Jamal Maes, MD as Consulting Physician (Nephrology) Donato Heinz, MD as Consulting Physician (Nephrology) Newt Minion, MD as Consulting Physician (Orthopedic Surgery) [facility]  Extended Emergency Contact Information Primary Emergency Contact: [name],[name] Address: [address]          [place], [place] [address] [name] Phone: [phone] Mobile Phone: [phone] Relation: Spouse Secondary Emergency Contact: [name], [name] [address] [name] Phone: [phone] Relation: Daughter  Code Status: Full Code  Goals of care: Advanced Directive information Advanced Directives 07/08/2016  Does Patient Have a Medical Advance Directive? No  Would patient like information on creating a medical advance directive? No - Patient declined  Pre-existing out of facility DNR order (yellow form or pink MOST form) -     Chief Complaint  Patient presents with  . Acute Visit    nausea and vomiting     HPI:  Pt is a 65 y.o. female seen today at [facility]  for an acute visit for evaluation of nausea and vomiting. She has a medical history of HTN, CHF, ESRD on hemodialysis M,W and F, PAD, Type 2 DM, Hyperlipidemia among other conditions. She is seen in her room lying in bed with Husband at bedside. She complains of nausea and vomiting unable to eat her breakfast this morning.Also complains of no bowel movement for the past three days. Patient's husband states patient had nausea and vomiting during recent hospital admission KUB done was negative. She also states having anxiety previously on diazepam for anxiety but not on medication list. Of note she  is status post hospital admission from 07/08/2016-07/13/2016 for evaluation of left leg arterial insufficiency. She underwent Left femoral to below-knee popliteal bypass with 6 mm propaten Gore-Tex graft on 07/08/2016.She complained of nausea post-op. She also developed left toe gangrene. She is to follow up with Dr. Sharol Given in two weeks.     Past Medical History:  Diagnosis Date  . Abdominal bruit   . Anemia   . Anxiety   . Arthritis    Osteoarthritis  . Asthma   . Cervical disc disease    "pinced nerve"  . CHF (congestive heart failure) ([place])   . Complication of anesthesia    " after I got home from my last procedure, I started itching."  . Diabetes mellitus    insulin dependent  . Diverticulitis   . ESRD on peritoneal dialysis [facility])    Hemodialysis - MWF  . GERD (gastroesophageal reflux disease)    from medications  . GI bleed 03/31/2013  . History of hiatal hernia   . Hyperlipidemia   . Hypertension   . Osteoporosis   . Pneumonia   . Seasonal allergies   . Shortness of breath dyspnea    WIth exertion  . Sleep apnea    can't afford cpap   Past Surgical History:  Procedure Laterality Date  . ABDOMINAL HYSTERECTOMY  1993`  . AV FISTULA PLACEMENT Left 04/21/2016   Procedure: INSERTION OF ARTERIOVENOUS (AV) GORE-TEX GRAFT ARM LEFT;  Surgeon: Elam Dutch, MD;  Location: [place];  Service: Vascular;  Laterality: Left;  . BASCILIC VEIN TRANSPOSITION Left 07/10/2014  Procedure: BASCILIC VEIN TRANSPOSITION;  Surgeon: Angelia Mould, MD;  Location: [place];  Service: Vascular;  Laterality: Left;  . BASCILIC VEIN TRANSPOSITION Right 11/08/2014   Procedure: FIRST STAGE BASILIC VEIN TRANSPOSITION;  Surgeon: Angelia Mould, MD;  Location: [place];  Service: Vascular;  Laterality: Right;  . BASCILIC VEIN TRANSPOSITION Right 01/18/2015   Procedure: SECOND STAGE BASILIC VEIN TRANSPOSITION;  Surgeon: Angelia Mould, MD;  Location: [facility] OR;  Service: Vascular;  Laterality:  Right;  . ESOPHAGOGASTRODUODENOSCOPY N/A 03/31/2013   Procedure: ESOPHAGOGASTRODUODENOSCOPY (EGD);  Surgeon: Gatha Mayer, MD;  Location: [facility] ENDOSCOPY;  Service: Endoscopy;  Laterality: N/A;  . EYE SURGERY     laser surgery  . FEMORAL-POPLITEAL BYPASS GRAFT Left 07/08/2016   Procedure: LEFT  FEMORAL-BELOW KNEE POPLITEAL ARTERY BYPASS GRAFT USING 6MM X 80 CM PROPATEN GORETEX GRAFT WITH RINGS.;  Surgeon: Rosetta Posner, MD;  Location: [place];  Service: Vascular;  Laterality: Left;  . FISTULOGRAM Left 10/29/2014   Procedure: FISTULOGRAM;  Surgeon: Angelia Mould, MD;  Location: [facility] CATH LAB;  Service: Cardiovascular;  Laterality: Left;  . LIGATION OF ARTERIOVENOUS  FISTULA Left 04/21/2016   Procedure: LIGATION OF ARTERIOVENOUS  FISTULA LEFT ARM;  Surgeon: Elam Dutch, MD;  Location: [place];  Service: Vascular;  Laterality: Left;  . PATCH ANGIOPLASTY Right 01/18/2015   Procedure: BASILIC VEIN PATCH ANGIOPLASTY USING VASCUGUARD PATCH;  Surgeon: Angelia Mould, MD;  Location: [place];  Service: Vascular;  Laterality: Right;  . PERIPHERAL VASCULAR CATHETERIZATION N/A 06/23/2016   Procedure: Abdominal Aortogram w/Lower Extremity;  Surgeon: Serafina Mitchell, MD;  Location: [place] CV LAB;  Service: Cardiovascular;  Laterality: N/A;  . PERIPHERAL VASCULAR CATHETERIZATION  06/23/2016   Procedure: Peripheral Vascular Intervention;  Surgeon: Serafina Mitchell, MD;  Location: [place] CV LAB;  Service: Cardiovascular;;  lt common and external illiac artery    Allergies  Allergen Reactions  . Lisinopril Cough  . Prednisone Other (See Comments)    Excessive fluid buildup    Allergies as of 07/14/2016      Reactions   Lisinopril Cough   Prednisone Other (See Comments)   Excessive fluid buildup      Medication List       Accurate as of 07/14/16  4:43 PM. Always use your most recent med list.          albuterol 108 (90 Base) MCG/ACT inhaler Commonly known as:  PROVENTIL  HFA;VENTOLIN HFA Inhale 1-2 puffs into the lungs every 6 (six) hours as needed for wheezing or shortness of breath.   amLODipine 10 MG tablet Commonly known as:  NORVASC Take 10 mg by mouth at bedtime.   aspirin 81 MG chewable tablet Chew 81 mg by mouth every evening.   atorvastatin 20 MG tablet Commonly known as:  LIPITOR Take 20 mg by mouth at bedtime.   AURYXIA 1 GM 210 MG(Fe) tablet Generic drug:  ferric citrate Take 630 mg by mouth 3 (three) times daily with meals.   budesonide-formoterol 160-4.5 MCG/ACT inhaler Commonly known as:  SYMBICORT Inhale 1 puff into the lungs every evening.   carvedilol 12.5 MG tablet Commonly known as:  COREG Take 2 tablets (25 mg total) by mouth 2 (two) times daily with a meal.   cinacalcet 30 MG tablet Commonly known as:  SENSIPAR Take 30 mg by mouth every evening.   clopidogrel 75 MG tablet Commonly known as:  PLAVIX Take 1 tablet (75 mg total) by mouth every evening.  feeding supplement (NEPRO CARB STEADY) Liqd Take 237 mLs by mouth 2 (two) times daily between meals.   fluticasone 50 MCG/ACT nasal spray Commonly known as:  FLONASE Place 1 spray into both nostrils daily as needed for allergies or rhinitis.   insulin aspart 100 UNIT/ML injection Commonly known as:  novoLOG Inject 0-8 Units into the skin 3 (three) times daily with meals. Sliding scale   isosorbide-hydrALAZINE 20-37.5 MG tablet Commonly known as:  BIDIL Take 1 tablet by mouth 2 (two) times daily.   LEVEMIR FLEXTOUCH 100 UNIT/ML Pen Generic drug:  Insulin Detemir Inject 10 Units into the muscle at bedtime.   loratadine 10 MG tablet Commonly known as:  CLARITIN Take 10 mg by mouth daily as needed for allergies.   multivitamin Tabs tablet Take 1 tablet by mouth every evening.   mupirocin ointment 2 % Commonly known as:  BACTROBAN Apply 1 application topically 2 (two) times daily.   omeprazole 20 MG capsule Commonly known as:  PRILOSEC Take 20 mg by  mouth daily.   oxyCODONE-acetaminophen 5-325 MG tablet Commonly known as:  PERCOCET/ROXICET Take 1 tablet by mouth every 8 (eight) hours as needed for moderate pain.   Vitamin D (Ergocalciferol) 50000 units Caps capsule Commonly known as:  DRISDOL Take 50,000 Units by mouth every 7 (seven) days. Mondays       Review of Systems  Constitutional: Negative for activity change, appetite change, chills and fever.  HENT: Negative for congestion, rhinorrhea, sinus pain, sinus pressure and sore throat.   Eyes: Negative.   Respiratory: Negative for cough, chest tightness, shortness of breath and wheezing.   Gastrointestinal: Positive for constipation, nausea and vomiting. Negative for abdominal pain and diarrhea.  Endocrine: Negative.   Genitourinary: Negative for frequency and urgency.       ESRD on dialysis   Musculoskeletal: Positive for gait problem.  Skin: Negative for color change, pallor and rash.       Left toe drsg   Neurological: Negative for dizziness, seizures, syncope, light-headedness and headaches.  Hematological: Does not bruise/bleed easily.  Psychiatric/Behavioral: Negative for agitation, confusion, hallucinations and sleep disturbance. The patient is nervous/anxious.     Immunization History  Administered Date(s) Administered  . Influenza,inj,Quad PF,36+ Mos 04/14/2013  . Tdap 09/11/2011   Pertinent  Health Maintenance Due  Topic Date Due  . FOOT EXAM  03/05/1961  . OPHTHALMOLOGY EXAM  03/05/1961  . URINE MICROALBUMIN  03/05/1961  . PAP SMEAR  03/05/1972  . COLONOSCOPY  03/05/2001  . MAMMOGRAM  03/01/2015  . INFLUENZA VACCINE  02/25/2016  . DEXA SCAN  03/05/2016  . PNA vac Low Risk Adult (1 of 2 - PCV13) 03/05/2016  . HEMOGLOBIN A1C  12/22/2016      Vitals:   07/14/16 1115  BP: (!) 135/59  Pulse: 79  Temp: 98.9 F (37.2 C)  Weight: 136 lb (61.7 kg)   Body mass index is 22.63 kg/m. Physical Exam  Constitutional: She is oriented to person, place,  and time. She appears well-developed and well-nourished. No distress.  HENT:  Head: Normocephalic.  Mouth/Throat: Oropharynx is clear and moist. No oropharyngeal exudate.  Eyes: Conjunctivae and EOM are normal. Pupils are equal, round, and reactive to light. Right eye exhibits no discharge. Left eye exhibits no discharge. No scleral icterus.  Neck: Normal range of motion. No JVD present. No thyromegaly present.  Cardiovascular: Normal rate, regular rhythm, normal heart sounds and intact distal pulses.  Exam reveals no gallop and no friction rub.  No murmur heard. Pulmonary/Chest: Effort normal and breath sounds normal. No respiratory distress. She has no wheezes. She has no rales.  Abdominal: Soft. Bowel sounds are normal. She exhibits no distension. There is no tenderness. There is no rebound and no guarding.  Musculoskeletal: She exhibits no tenderness or deformity.  Moves x 4 extremities. 1-2 + edema left foot.   Lymphadenopathy:    She has no cervical adenopathy.  Neurological: She is oriented to person, place, and time.  Skin: Skin is warm and dry. No rash noted. No erythema. No pallor.  Left toe drsg dry, clean and intact.   Psychiatric:  Anxious     Labs reviewed:  Recent Labs  10/07/15 0347  10/12/15 1601 10/14/15 0942  07/11/16 0338 07/12/16 0317 07/13/16 0652  NA  --   < > 136 135  < > 132* 130* 129*  K  --   < > 4.8 4.2  < > 3.8 4.7 5.0  CL  --   < > 97* 97*  < > 93* 91* 89*  CO2  --   < > 27 24  < > 25 25 25   GLUCOSE  --   < > 194* 150*  < > 130* 177* 90  BUN  --   < > 40* 28*  < > 18 29* 41*  CREATININE  --   < > 3.48* 3.83*  < > 4.57* 6.06* 7.89*  CALCIUM  --   < > 9.4 9.9  < > 8.6* 8.4* 8.6*  MG 1.7  --   --   --   --   --   --   --   PHOS  --   < > 3.4 3.5  --   --   --  4.3  < > = values in this interval not displayed.  Recent Labs  10/09/15 1952 10/10/15 0523  10/14/15 0942 07/07/16 0915 07/13/16 0652  AST 27 20  --   --  21  --   ALT 25 23  --    --  14  --   ALKPHOS 137* 119  --   --  120  --   BILITOT 0.6 0.5  --   --  0.3  --   PROT 7.5 6.8  --   --  7.7  --   ALBUMIN 3.3* 3.1*  < > 3.2* 3.2* 2.6*  < > = values in this interval not displayed.  Recent Labs  10/09/15 1952  10/10/15 0523  10/12/15 1600  07/11/16 0338 07/12/16 0317 07/13/16 0652 07/13/16 0852  WBC 7.6  < > 7.9  < > 7.4  < > 18.6* 15.3* 3.3*  --   NEUTROABS 4.7  --  5.3  --  3.8  --   --   --   --   --   HGB 10.3*  < > 9.3*  < > 8.8*  < > 8.6* 8.2* 7.5* 7.5*  HCT 31.6*  < > 29.4*  < > 27.5*  < > 26.2* 25.3* 23.0* 22.6*  MCV 92.9  < > 92.5  < > 92.0  < > 91.3 92.0 91.6  --   PLT 287  < > 261  < > 252  < > 296 322 297  --   < > = values in this interval not displayed. Lab Results  Component Value Date   TSH 0.818 06/12/2015   Lab Results  Component Value Date   HGBA1C 7.5 (H) 06/24/2016  Lab Results  Component Value Date   CHOL 184 12/01/2010   HDL 88 12/01/2010   LDLCALC  12/01/2010    79        Total Cholesterol/HDL:CHD Risk Coronary Heart Disease Risk Table                     Men   Women  1/2 Average Risk   3.4   3.3  Average Risk       5.0   4.4  2 X Average Risk   9.6   7.1  3 X Average Risk  23.4   11.0        Use the calculated Patient Ratio above and the CHD Risk Table to determine the patient's CHD Risk.        ATP III CLASSIFICATION (LDL):  <100     mg/dL   Optimal  100-129  mg/dL   Near or Above                    Optimal  130-159  mg/dL   Borderline  160-189  mg/dL   High  >190     mg/dL   Very High   TRIG 86 12/01/2010   CHOLHDL 2.1 12/01/2010    Significant Diagnostic Results in last 30 days:  Dg Abd Portable 1v  Result Date: 07/11/2016 CLINICAL DATA:  Nausea and vomiting for the past 4 days. EXAM: PORTABLE ABDOMEN - 1 VIEW COMPARISON:  07/09/2016. FINDINGS: Normal bowel gas pattern. Mildly prominent stool in the colon. Extensive arterial calcifications, including the abdominal aorta and its branches. Left common  iliac artery stents. Left groin vascular clips. Lumbar and lower thoracic spine degenerative changes. IMPRESSION: 1. No acute abnormality. 2. Mildly prominent stool. 3. Aortic atherosclerosis. Electronically Signed   By: Claudie Revering M.D.   On: 07/11/2016 13:47   Dg Abd Portable 1v  Result Date: 07/09/2016 CLINICAL DATA:  Vomiting EXAM: PORTABLE ABDOMEN - 1 VIEW COMPARISON:  None. FINDINGS: Scattered large and small bowel gas is noted. Fecal material is noted within the colon consistent with a mild degree of constipation. No obstructive changes are seen. Aortic calcifications are noted. Left iliac arterial stents are seen. Calcifications are noted in the hila of the kidneys bilaterally likely related to renal arterial calcifications. No bony abnormality is seen. Degenerative changes of the lumbar spine are noted. IMPRESSION: Mild constipation.  No other focal abnormality is noted. Electronically Signed   By: Inez Catalina M.D.   On: 07/09/2016 15:26    Assessment/Plan 1. Intractable vomiting with nausea, unspecified vomiting type Symptoms occurred since post-op Left femoral to below-knee popliteal bypass with 6 mm propaten Gore-Tex graft on 07/08/2016.Will start on Zofran 4 mg tablet every 6 Hours as needed. Facility Nurse to offer Zofran one hour prior to meals. Obtain CBC, BMP 07/15/2016.   2. Generalized anxiety disorder Previously on Diazepam. Start Alprazolam 0.25 mg Tablet one by mouth daily at bedtime.   3. Slow transit constipation No BM X 3 days.start Duclax supp one PR x 1 dose now may repeat x 1 dose if no BM results.     Family/ staff Communication: Reviewed plan of care with patient, Patient's Husband and facility Nurse supervisor.   Labs/tests ordered: CBC, BMP 07/15/2016.

## 2016-07-15 DIAGNOSIS — D631 Anemia in chronic kidney disease: Secondary | ICD-10-CM | POA: Diagnosis not present

## 2016-07-15 DIAGNOSIS — L03032 Cellulitis of left toe: Secondary | ICD-10-CM | POA: Diagnosis not present

## 2016-07-15 DIAGNOSIS — N186 End stage renal disease: Secondary | ICD-10-CM | POA: Diagnosis not present

## 2016-07-15 DIAGNOSIS — D509 Iron deficiency anemia, unspecified: Secondary | ICD-10-CM | POA: Diagnosis not present

## 2016-07-15 DIAGNOSIS — N2581 Secondary hyperparathyroidism of renal origin: Secondary | ICD-10-CM | POA: Diagnosis not present

## 2016-07-15 DIAGNOSIS — E1122 Type 2 diabetes mellitus with diabetic chronic kidney disease: Secondary | ICD-10-CM | POA: Diagnosis not present

## 2016-07-15 LAB — CBC AND DIFFERENTIAL
HCT: 28 % — AB (ref 36–46)
Hemoglobin: 9 g/dL — AB (ref 12.0–16.0)
Platelets: 332 10*3/uL (ref 150–399)
WBC: 12.1 10^3/mL

## 2016-07-15 LAB — BASIC METABOLIC PANEL
BUN: 30 mg/dL — AB (ref 4–21)
Creatinine: 6 mg/dL — AB (ref 0.5–1.1)
Glucose: 106 mg/dL
Potassium: 4.5 mmol/L (ref 3.4–5.3)
Sodium: 135 mmol/L — AB (ref 137–147)

## 2016-07-16 ENCOUNTER — Encounter: Payer: Self-pay | Admitting: Vascular Surgery

## 2016-07-16 ENCOUNTER — Encounter: Payer: Self-pay | Admitting: Internal Medicine

## 2016-07-16 ENCOUNTER — Non-Acute Institutional Stay (SKILLED_NURSING_FACILITY): Payer: Medicare Other | Admitting: Internal Medicine

## 2016-07-16 DIAGNOSIS — D62 Acute posthemorrhagic anemia: Secondary | ICD-10-CM | POA: Diagnosis not present

## 2016-07-16 DIAGNOSIS — F4321 Adjustment disorder with depressed mood: Secondary | ICD-10-CM

## 2016-07-16 DIAGNOSIS — R112 Nausea with vomiting, unspecified: Secondary | ICD-10-CM | POA: Diagnosis not present

## 2016-07-16 DIAGNOSIS — I5032 Chronic diastolic (congestive) heart failure: Secondary | ICD-10-CM | POA: Diagnosis not present

## 2016-07-16 DIAGNOSIS — I96 Gangrene, not elsewhere classified: Secondary | ICD-10-CM | POA: Diagnosis not present

## 2016-07-16 DIAGNOSIS — E785 Hyperlipidemia, unspecified: Secondary | ICD-10-CM | POA: Diagnosis not present

## 2016-07-16 DIAGNOSIS — I10 Essential (primary) hypertension: Secondary | ICD-10-CM

## 2016-07-16 DIAGNOSIS — Z992 Dependence on renal dialysis: Secondary | ICD-10-CM

## 2016-07-16 DIAGNOSIS — I739 Peripheral vascular disease, unspecified: Secondary | ICD-10-CM

## 2016-07-16 DIAGNOSIS — K219 Gastro-esophageal reflux disease without esophagitis: Secondary | ICD-10-CM | POA: Diagnosis not present

## 2016-07-16 DIAGNOSIS — E871 Hypo-osmolality and hyponatremia: Secondary | ICD-10-CM

## 2016-07-16 DIAGNOSIS — R5381 Other malaise: Secondary | ICD-10-CM

## 2016-07-16 DIAGNOSIS — E1121 Type 2 diabetes mellitus with diabetic nephropathy: Secondary | ICD-10-CM

## 2016-07-16 DIAGNOSIS — N186 End stage renal disease: Secondary | ICD-10-CM | POA: Diagnosis not present

## 2016-07-16 NOTE — Progress Notes (Signed)
LOCATION: [facility]  PCP: Velna Hatchet, MD   Code Status: Full Code  Goals of care: Advanced Directive information Advanced Directives 07/08/2016  Does Patient Have a Medical Advance Directive? No  Would patient like information on creating a medical advance directive? No - Patient declined  Pre-existing out of facility DNR order (yellow form or pink MOST form) -       Extended Emergency Contact Information Primary Emergency Contact: Swoboda,[name] Address: [address]          [place], [place] [address] [place] Phone: [phone] Mobile Phone: [phone] Relation: Spouse Secondary Emergency Contact: [place], [place] [address] [name] Phone: [phone] Relation: Daughter   Allergies  Allergen Reactions  . Lisinopril Cough  . Prednisone Other (See Comments)    Excessive fluid buildup    Chief Complaint  Patient presents with  . New Admit To SNF    New Admission Visit      HPI:  Patient is a 65 y.o. female seen today for short term rehabilitation post hospital admission from 07/08/16-07/13/16 with PVD with LLE pain and gangrene to her toes. She underwent left femoral to below knee popliteal bypass with graft on 07/08/16. She required 2 u prbc transfusion. She has history of ESRD on HD, PVD, CHF, GERD, anxiety, HTN among others. She is seen in her room today with husband present.   Review of Systems:  Constitutional: Negative for fever, chills, diaphoresis.  HENT: Negative for headache, congestion, nasal discharge, difficulty swallowing.   Eyes: Negative for double vision and discharge.  Respiratory: Negative for cough, shortness of breath and wheezing.   Cardiovascular: Negative for chest pain, palpitations, leg swelling.  Gastrointestinal: Negative for heartburn,abdominal pain. Positive for nausea and vomiting. Last bowel movement was 2 days back. She is passing flatus. She has poor  appetite.  Genitourinary: Negative for dysuria.  Musculoskeletal: Negative for back pain, fall. denies pain.  Skin: Negative for itching, rash.  Neurological: Negative for dizziness. Positive for generalized weakness. Psychiatric/Behavioral: positive for anxiety and depression.    Past Medical History:  Diagnosis Date  . Abdominal bruit   . Anemia   . Anxiety   . Arthritis    Osteoarthritis  . Asthma   . Cervical disc disease    "pinced nerve"  . CHF (congestive heart failure) ([place])   . Complication of anesthesia    " after I got home from my last procedure, I started itching."  . Diabetes mellitus    insulin dependent  . Diverticulitis   . ESRD on peritoneal dialysis [facility])    Hemodialysis - MWF  . GERD (gastroesophageal reflux disease)    from medications  . GI bleed 03/31/2013  . History of hiatal hernia   . Hyperlipidemia   . Hypertension   . Osteoporosis   . Pneumonia   . Seasonal allergies   . Shortness of breath dyspnea    WIth exertion  . Sleep apnea    can't afford cpap   Past Surgical History:  Procedure Laterality Date  . ABDOMINAL HYSTERECTOMY  1993`  . AV FISTULA PLACEMENT Left 04/21/2016   Procedure: INSERTION OF ARTERIOVENOUS (AV) GORE-TEX GRAFT ARM LEFT;  Surgeon: Elam Dutch, MD;  Location: [facility];  Service: Vascular;  Laterality: Left;  . [place] TRANSPOSITION Left 07/10/2014   Procedure: BASCILIC VEIN TRANSPOSITION;  Surgeon: Angelia Mould, MD;  Location: [place];  Service: Vascular;  Laterality: Left;  . BASCILIC VEIN TRANSPOSITION Right 11/08/2014   Procedure: FIRST STAGE BASILIC VEIN TRANSPOSITION;  Surgeon: Angelia Mould, MD;  Location: [facility];  Service: Vascular;  Laterality: Right;  . BASCILIC VEIN TRANSPOSITION Right 01/18/2015   Procedure: SECOND STAGE BASILIC VEIN TRANSPOSITION;  Surgeon: Angelia Mould, MD;  Location: [facility] OR;  Service: Vascular;  Laterality: Right;  . ESOPHAGOGASTRODUODENOSCOPY N/A 03/31/2013    Procedure: ESOPHAGOGASTRODUODENOSCOPY (EGD);  Surgeon: Gatha Mayer, MD;  [facility] ENDOSCOPY;  Service: Endoscopy;  Laterality: N/A;  . EYE SURGERY     laser surgery  . FEMORAL-POPLITEAL BYPASS GRAFT Left 07/08/2016   Procedure: LEFT  FEMORAL-BELOW KNEE POPLITEAL ARTERY BYPASS GRAFT USING 6MM X 80 CM PROPATEN GORETEX GRAFT WITH RINGS.;  Surgeon: Rosetta Posner, MD;  Location: [facility];  Service: Vascular;  Laterality: Left;  . FISTULOGRAM Left 10/29/2014   Procedure: FISTULOGRAM;  Surgeon: Angelia Mould, MD;  Location: [facility] C[facility] LAB;  Service: Cardiovascular;  Laterality: Left;  . LIGATION OF ARTERIOVENOUS  FISTULA Left 04/21/2016   Procedure: LIGATION OF ARTERIOVENOUS  FISTULA LEFT ARM;  Surgeon: Elam Dutch, MD;  Location: [facility];  Service: Vascular;  Laterality: Left;  . PATCH ANGIOPLASTY Right 01/18/2015   Procedure: BASILIC VEIN PATCH ANGIOPLASTY USING VASCUGUARD PATCH;  Surgeon: Angelia Mould, MD;  Location: [facility];  Service: Vascular;  Laterality: Right;  . PERIPHERAL VASCULAR CATHETERIZATION N/A 06/23/2016   Procedure: Abdominal Aortogram w/Lower Extremity;  Surgeon: Serafina Mitchell, MD;  Location: [facility] CV LAB;  Service: Cardiovascular;  Laterality: N/A;  . PERIPHERAL VASCULAR CATHETERIZATION  06/23/2016   Procedure: Peripheral Vascular Intervention;  Surgeon: Serafina Mitchell, MD;  Location: [facility] CV LAB;  Service: Cardiovascular;;  lt common and external illiac artery   Social History:   reports that she quit smoking about 4 years ago. Her smoking use included Cigarettes. She has a 14.00 pack-year smoking history. She has never used smokeless tobacco. She reports that she uses drugs. She reports that she does not drink alcohol.  Family History  Problem Relation Age of Onset  . Other Mother     not sure of cause of death  . Diabetes Father   . Pancreatic cancer Maternal Grandmother   . Colon cancer Neg Hx     Medications: Allergies as of 07/16/2016       Reactions   Lisinopril Cough   Prednisone Other (See Comments)   Excessive fluid buildup      Medication List       Accurate as of 07/16/16  7:03 PM. Always use your most recent med list.          albuterol 108 (90 Base) MCG/ACT inhaler Commonly known as:  PROVENTIL HFA;VENTOLIN HFA Inhale 1-2 puffs into the lungs every 6 (six) hours as needed for wheezing or shortness of breath.   ALPRAZolam 0.25 MG tablet Commonly known as:  XANAX Take 0.25 mg by mouth daily.   amLODipine 10 MG tablet Commonly known as:  NORVASC Take 10 mg by mouth at bedtime.   aspirin 81 MG chewable tablet Chew 81 mg by mouth every evening.   atorvastatin 20 MG tablet Commonly known as:  LIPITOR Take 20 mg by mouth at bedtime.   AURYXIA 1 GM 210 MG(Fe) tablet Generic drug:  ferric citrate Take 630 mg by mouth 3 (three) times daily with meals.   budesonide-formoterol 160-4.5 MCG/ACT inhaler Commonly known as:  SYMBICORT Inhale 1 puff into  the lungs every evening.   carvedilol 12.5 MG tablet Commonly known as:  COREG Take 2 tablets (25 mg total) by mouth 2 (two) times daily with a meal.   cinacalcet 30 MG tablet Commonly known as:  SENSIPAR Take 30 mg by mouth every evening.   clopidogrel 75 MG tablet Commonly known as:  PLAVIX Take 1 tablet (75 mg total) by mouth every evening.   feeding supplement (NEPRO CARB STEADY) Liqd Take 237 mLs by mouth 2 (two) times daily between meals.   fluticasone 50 MCG/ACT nasal spray Commonly known as:  FLONASE Place 1 spray into both nostrils daily as needed for allergies or rhinitis.   insulin aspart 100 UNIT/ML injection Commonly known as:  novoLOG Inject 0-8 Units into the skin 3 (three) times daily with meals. Sliding scale   isosorbide-hydrALAZINE 20-37.5 MG tablet Commonly known as:  BIDIL Take 1 tablet by mouth 2 (two) times daily.   LEVEMIR FLEXTOUCH 100 UNIT/ML Pen Generic drug:  Insulin Detemir Inject 10 Units into the muscle at  bedtime.   loratadine 10 MG tablet Commonly known as:  CLARITIN Take 10 mg by mouth daily as needed for allergies.   multivitamin Tabs tablet Take 1 tablet by mouth every evening.   mupirocin ointment 2 % Commonly known as:  BACTROBAN Apply 1 application topically 2 (two) times daily.   omeprazole 20 MG capsule Commonly known as:  PRILOSEC Take 20 mg by mouth daily.   ondansetron 4 MG tablet Commonly known as:  ZOFRAN Take 4 mg by mouth every 6 (six) hours as needed for nausea or vomiting.   oxyCODONE-acetaminophen 5-325 MG tablet Commonly known as:  PERCOCET/ROXICET Take 1 tablet by mouth every 8 (eight) hours as needed for moderate pain.   Vitamin D (Ergocalciferol) 50000 units Caps capsule Commonly known as:  DRISDOL Take 50,000 Units by mouth every 7 (seven) days. Mondays       Immunizations: Immunization History  Administered Date(s) Administered  . Influenza,inj,Quad PF,36+ Mos 04/14/2013  . Tdap 09/11/2011     Physical Exam:  Vitals:   07/16/16 1255  BP: (!) 147/62  Pulse: 84  Resp: 17  Temp: 97.7 F (36.5 C)  TempSrc: Oral  SpO2: 98%  Weight: 136 lb (61.7 kg)  Height: 5\' 5"  (1.651 m)   Body mass index is 22.63 kg/m.  General- elderly female, frail and chronically ill appearing, in no acute distress Head- normocephalic, atraumatic Nose- no maxillary or frontal sinus tenderness, no nasal discharge Throat- moist mucus membrane  Eyes- PERRLA, EOMI, no pallor, no icterus Neck- no cervical lymphadenopathy Cardiovascular- normal s1,s2, no murmur Respiratory- bilateral clear to auscultation, no wheeze, no rhonchi, no crackles, no use of accessory muscles Abdomen- bowel sounds present, soft, non tender, no guarding or rigidity, no CVA tenderness Musculoskeletal- able to move all 4 extremities, surgical incision from left groin extending to left below knee 4 incision healing well with glue and internal sutures. Neurological- alert and oriented to  person, place and time Skin- warm and dry, 4th and 5th digit of LLE kissing ulcer with toe gangrene of left great toe, fourth and 5th toes, left UE AV fistula Nails- hypertrophied nails Psychiatry- flat affect    Labs reviewed: Basic Metabolic Panel:  Recent Labs  10/07/15 0347  10/12/15 1601 10/14/15 0942  07/11/16 0338 07/12/16 0317 07/13/16 0652 07/15/16  NA  --   < > 136 135  < > 132* 130* 129* 135*  K  --   < > 4.8  4.2  < > 3.8 4.7 5.0 4.5  CL  --   < > 97* 97*  < > 93* 91* 89*  --   CO2  --   < > 27 24  < > 25 25 25   --   GLUCOSE  --   < > 194* 150*  < > 130* 177* 90  --   BUN  --   < > 40* 28*  < > 18 29* 41* 30*  CREATININE  --   < > 3.48* 3.83*  < > 4.57* 6.06* 7.89* 6.0*  CALCIUM  --   < > 9.4 9.9  < > 8.6* 8.4* 8.6*  --   MG 1.7  --   --   --   --   --   --   --   --   PHOS  --   < > 3.4 3.5  --   --   --  4.3  --   < > = values in this interval not displayed. Liver Function Tests:  Recent Labs  10/09/15 1952 10/10/15 0523  10/14/15 0942 07/07/16 0915 07/13/16 0652  AST 27 20  --   --  21  --   ALT 25 23  --   --  14  --   ALKPHOS 137* 119  --   --  120  --   BILITOT 0.6 0.5  --   --  0.3  --   PROT 7.5 6.8  --   --  7.7  --   ALBUMIN 3.3* 3.1*  < > 3.2* 3.2* 2.6*  < > = values in this interval not displayed. No results for input(s): LIPASE, AMYLASE in the last 8760 hours. No results for input(s): AMMONIA in the last 8760 hours. CBC:  Recent Labs  10/09/15 1952  10/10/15 0523  10/12/15 1600  07/11/16 0338 07/12/16 0317 07/13/16 0652 07/13/16 0852 07/15/16  WBC 7.6  < > 7.9  < > 7.4  < > 18.6* 15.3* 3.3*  --  12.1  NEUTROABS 4.7  --  5.3  --  3.8  --   --   --   --   --   --   HGB 10.3*  < > 9.3*  < > 8.8*  < > 8.6* 8.2* 7.5* 7.5* 9.0*  HCT 31.6*  < > 29.4*  < > 27.5*  < > 26.2* 25.3* 23.0* 22.6* 28*  MCV 92.9  < > 92.5  < > 92.0  < > 91.3 92.0 91.6  --   --   PLT 287  < > 261  < > 252  < > 296 322 297  --  332  < > = values in this interval  not displayed. Cardiac Enzymes:  Recent Labs  10/10/15 0125 10/10/15 0523 10/10/15 1312  TROPONINI 0.04* 0.04* 0.04*   BNP: Invalid input(s): POCBNP CBG:  Recent Labs  07/12/16 2100 07/13/16 0608 07/13/16 1344  GLUCAP 218* 95 81    Radiological Exams: Dg Abd Portable 1v  Result Date: 07/11/2016 CLINICAL DATA:  Nausea and vomiting for the past 4 days. EXAM: PORTABLE ABDOMEN - 1 VIEW COMPARISON:  07/09/2016. FINDINGS: Normal bowel gas pattern. Mildly prominent stool in the colon. Extensive arterial calcifications, including the abdominal aorta and its branches. Left common iliac artery stents. Left groin vascular clips. Lumbar and lower thoracic spine degenerative changes. IMPRESSION: 1. No acute abnormality. 2. Mildly prominent stool. 3. Aortic atherosclerosis. Electronically Signed   By: Remo Lipps  Joneen Caraway M.D.   On: 07/11/2016 13:47   Dg Abd Portable 1v  Result Date: 07/09/2016 CLINICAL DATA:  Vomiting EXAM: PORTABLE ABDOMEN - 1 VIEW COMPARISON:  None. FINDINGS: Scattered large and small bowel gas is noted. Fecal material is noted within the colon consistent with a mild degree of constipation. No obstructive changes are seen. Aortic calcifications are noted. Left iliac arterial stents are seen. Calcifications are noted in the hila of the kidneys bilaterally likely related to renal arterial calcifications. No bony abnormality is seen. Degenerative changes of the lumbar spine are noted. IMPRESSION: Mild constipation.  No other focal abnormality is noted. Electronically Signed   By: Inez Catalina M.D.   On: 07/09/2016 15:26    Assessment/Plan  Physical deconditioning Will have her work with physical therapy and occupational therapy team to help with gait training and muscle strengthening exercises.fall precautions. Skin care. Encourage to be out of bed.   Left PAD S/p left femoral to below knee popliteal bypass graft. Has follow up with vascular surgery. Will have patient work with  PT/OT as tolerated to regain strength and restore function.  Fall precautions are in place.continue wound care. Continue percocet 5-325 mg 1 tab q8h prn pain.   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   Blood loss anemia Post op with 2 u prbc transfusion. Lab shows improved Hb compared to in hospital. Monitor cbc. Continue ferric citrate.   Situational depression and anxiety Get psychotherapy consult to assess further continue alprazolam 1 mg qhs.   Nausea and vomiting No findings of acute abdomen on exam. Reviewed labs. Her ESRD and GERD likely contributing. Phenergan im x 1 now and zofran 4 mg prn nausea for now. See below. Her ferric citrate can also contribute to this.   gerd On plavix, d/c omeprazole. Start pantoprazole 40 mg bid for now and monitor.  Hyponatremia Improved sodium level. Monitor bmp  ESRD On dialysis 3 days a week  Chronic diastolic chf Continue hydralazine, imdur, metoprolol and monitor  HTN Monitor bp, continue norvasc, bidil and carvedilol.   Dm type 2 with renal complication Lab Results  Component Value Date   HGBA1C 7.5 (H) 06/24/2016   Monitor cbg. Continue levemir 10 u daily and SSI humalog.   HLD Continue atorvastatin     Goals of care: short term rehabilitation   Labs/tests ordered:  Family/ staff Communication: reviewed care plan with patient and nursing supervisor    Destiny Serve, MD Internal Medicine [place], [place] [address] Cell Phone (Monday-Friday 8 am - 5 pm): [phone] On Call: [phone] and follow prompts after 5 pm and on weekends Office Phone: [phone] Office Fax: [phone]

## 2016-07-17 DIAGNOSIS — N2581 Secondary hyperparathyroidism of renal origin: Secondary | ICD-10-CM | POA: Diagnosis not present

## 2016-07-17 DIAGNOSIS — D631 Anemia in chronic kidney disease: Secondary | ICD-10-CM | POA: Diagnosis not present

## 2016-07-17 DIAGNOSIS — L03032 Cellulitis of left toe: Secondary | ICD-10-CM | POA: Diagnosis not present

## 2016-07-17 DIAGNOSIS — N186 End stage renal disease: Secondary | ICD-10-CM | POA: Diagnosis not present

## 2016-07-17 DIAGNOSIS — E1122 Type 2 diabetes mellitus with diabetic chronic kidney disease: Secondary | ICD-10-CM | POA: Diagnosis not present

## 2016-07-17 DIAGNOSIS — D509 Iron deficiency anemia, unspecified: Secondary | ICD-10-CM | POA: Diagnosis not present

## 2016-07-19 DIAGNOSIS — L03032 Cellulitis of left toe: Secondary | ICD-10-CM | POA: Diagnosis not present

## 2016-07-19 DIAGNOSIS — E1122 Type 2 diabetes mellitus with diabetic chronic kidney disease: Secondary | ICD-10-CM | POA: Diagnosis not present

## 2016-07-19 DIAGNOSIS — D631 Anemia in chronic kidney disease: Secondary | ICD-10-CM | POA: Diagnosis not present

## 2016-07-19 DIAGNOSIS — N186 End stage renal disease: Secondary | ICD-10-CM | POA: Diagnosis not present

## 2016-07-19 DIAGNOSIS — D509 Iron deficiency anemia, unspecified: Secondary | ICD-10-CM | POA: Diagnosis not present

## 2016-07-19 DIAGNOSIS — N2581 Secondary hyperparathyroidism of renal origin: Secondary | ICD-10-CM | POA: Diagnosis not present

## 2016-07-22 DIAGNOSIS — D509 Iron deficiency anemia, unspecified: Secondary | ICD-10-CM | POA: Diagnosis not present

## 2016-07-22 DIAGNOSIS — D631 Anemia in chronic kidney disease: Secondary | ICD-10-CM | POA: Diagnosis not present

## 2016-07-22 DIAGNOSIS — L03032 Cellulitis of left toe: Secondary | ICD-10-CM | POA: Diagnosis not present

## 2016-07-22 DIAGNOSIS — N186 End stage renal disease: Secondary | ICD-10-CM | POA: Diagnosis not present

## 2016-07-22 DIAGNOSIS — E1122 Type 2 diabetes mellitus with diabetic chronic kidney disease: Secondary | ICD-10-CM | POA: Diagnosis not present

## 2016-07-22 DIAGNOSIS — N2581 Secondary hyperparathyroidism of renal origin: Secondary | ICD-10-CM | POA: Diagnosis not present

## 2016-07-23 ENCOUNTER — Non-Acute Institutional Stay (SKILLED_NURSING_FACILITY): Payer: Medicare Other | Admitting: Family

## 2016-07-23 ENCOUNTER — Encounter: Payer: Self-pay | Admitting: Family

## 2016-07-23 DIAGNOSIS — E782 Mixed hyperlipidemia: Secondary | ICD-10-CM

## 2016-07-23 DIAGNOSIS — I739 Peripheral vascular disease, unspecified: Secondary | ICD-10-CM | POA: Diagnosis not present

## 2016-07-23 DIAGNOSIS — E1121 Type 2 diabetes mellitus with diabetic nephropathy: Secondary | ICD-10-CM

## 2016-07-23 DIAGNOSIS — I5032 Chronic diastolic (congestive) heart failure: Secondary | ICD-10-CM | POA: Diagnosis not present

## 2016-07-23 DIAGNOSIS — Z794 Long term (current) use of insulin: Secondary | ICD-10-CM

## 2016-07-23 DIAGNOSIS — I96 Gangrene, not elsewhere classified: Secondary | ICD-10-CM | POA: Diagnosis not present

## 2016-07-23 DIAGNOSIS — I110 Hypertensive heart disease with heart failure: Secondary | ICD-10-CM | POA: Diagnosis not present

## 2016-07-23 DIAGNOSIS — N184 Chronic kidney disease, stage 4 (severe): Secondary | ICD-10-CM

## 2016-07-23 DIAGNOSIS — R2681 Unsteadiness on feet: Secondary | ICD-10-CM

## 2016-07-23 NOTE — Progress Notes (Signed)
Location:  [place] Room Number: 702-P Place of Service:  SNF (31)  Provider: Marlowe Sax FNP-C   PCP: Velna Hatchet, MD Patient Care Team: Velna Hatchet, MD as PCP - General (Internal Medicine) Jamal Maes, MD as Consulting Physician (Nephrology) Donato Heinz, MD as Consulting Physician (Nephrology) Newt Minion, MD as Consulting Physician (Orthopedic Surgery) [facility]  Extended Emergency Contact Information Primary Emergency Contact: Remley,[name] Address: [address]          [place], [place] [address] [name] of [place] Phone: [phone] Mobile Phone: [phone] Relation: Spouse Secondary Emergency Contact: Vivian, [place] [address] [name] of [place] Phone: [phone] Relation: Daughter  Code Status: Full Code  Goals of care:  Advanced Directive information Advanced Directives 07/08/2016  Does Patient Have a Medical Advance Directive? No  Would patient like information on creating a medical advance directive? No - Patient declined  Pre-existing out of facility DNR order (yellow form or pink MOST form) -     Allergies  Allergen Reactions  . Lisinopril Cough  . Prednisone Other (See Comments)    Excessive fluid buildup    Chief Complaint  Patient presents with  . Discharge Note    Discharge from SNF    HPI:  65 y.o. female seen today at  [facility] for discharge home. She was here for short term Rehabilitation post hospital admission from 07/08/2016-07/13/2016 with PVD with LLE pain and gangrene to her toes. She underwent left femoral to below knee popliteal bypass with graft on 07/08/16. She required 2 units of PRBC transfusion. She has a medical history of HTN, ESRD on HD, PVD, CHF, GERD, anxiety among other conditions. She is seen in her room today. She denies any acute issues this visit. She states left foot under  control. She has worked well with PT/OT now stable for discharge home.She will be discharged home with Home health PT/OT to continue with ROM, Exercise, Gait stability and muscle strengthening. She will also will also discharge home with [facility] RN for left 4th and 5 th digit " Kissing" ulcer between toes for wound management.She does not require any DME states has own Rolator at home.Home health services will be arranged by facility social worker prior to discharge. Prescription medication will be written x 1 month then patient to follow up with PCP in 1-2 weeks. Facility staff report no new concerns.      Past Medical History:  Diagnosis Date  . Abdominal bruit   . Anemia   . Anxiety   . Arthritis    Osteoarthritis  . Asthma   . Cervical disc disease    "pinced nerve"  . CHF (congestive heart failure) ([place])   . Complication of anesthesia    " after I got home from my last procedure, I started itching."  . Diabetes mellitus    insulin dependent  . Diverticulitis   . ESRD on peritoneal dialysis [facility])    Hemodialysis - MWF  . GERD (gastroesophageal reflux disease)    from medications  . GI bleed 03/31/2013  . History of hiatal hernia   . Hyperlipidemia   . Hypertension   . Osteoporosis   . Pneumonia   . Seasonal allergies   . Shortness of breath dyspnea    WIth exertion  . Sleep apnea    can't afford cpap    Past Surgical History:  Procedure Laterality Date  . ABDOMINAL HYSTERECTOMY  1993`  . AV FISTULA PLACEMENT Left 04/21/2016   Procedure: INSERTION OF ARTERIOVENOUS (AV) GORE-TEX GRAFT ARM LEFT;  Surgeon: Elam Dutch, MD;  Location: [place];  Service: Vascular;  Laterality: Left;  . [place] TRANSPOSITION Left 07/10/2014   Procedure: BASCILIC VEIN TRANSPOSITION;  Surgeon: Angelia Mould, MD;  Location: [place];  Service: Vascular;  Laterality: Left;  . BASCILIC VEIN TRANSPOSITION Right 11/08/2014   Procedure: FIRST STAGE BASILIC VEIN TRANSPOSITION;  Surgeon:  Angelia Mould, MD;  Location: [place];  Service: Vascular;  Laterality: Right;  . BASCILIC VEIN TRANSPOSITION Right 01/18/2015   Procedure: SECOND STAGE BASILIC VEIN TRANSPOSITION;  Surgeon: Angelia Mould, MD;  Location: [facility] OR;  Service: Vascular;  Laterality: Right;  . ESOPHAGOGASTRODUODENOSCOPY N/A 03/31/2013   Procedure: ESOPHAGOGASTRODUODENOSCOPY (EGD);  Surgeon: Gatha Mayer, MD;  Location: [facility] ENDOSCOPY;  Service: Endoscopy;  Laterality: N/A;  . EYE SURGERY     laser surgery  . FEMORAL-POPLITEAL BYPASS GRAFT Left 07/08/2016   Procedure: LEFT  FEMORAL-BELOW KNEE POPLITEAL ARTERY BYPASS GRAFT USING 6MM X 80 CM PROPATEN GORETEX GRAFT WITH RINGS.;  Surgeon: Rosetta Posner, MD;  Location: [place];  Service: Vascular;  Laterality: Left;  . FISTULOGRAM Left 10/29/2014   Procedure: FISTULOGRAM;  Surgeon: Angelia Mould, MD;  Location: [facility] CATH LAB;  Service: Cardiovascular;  Laterality: Left;  . LIGATION OF ARTERIOVENOUS  FISTULA Left 04/21/2016   Procedure: LIGATION OF ARTERIOVENOUS  FISTULA LEFT ARM;  Surgeon: Elam Dutch, MD;  Location: [place];  Service: Vascular;  Laterality: Left;  . PATCH ANGIOPLASTY Right 01/18/2015   Procedure: BASILIC VEIN PATCH ANGIOPLASTY USING VASCUGUARD PATCH;  Surgeon: Angelia Mould, MD;  Location: [place];  Service: Vascular;  Laterality: Right;  . PERIPHERAL VASCULAR CATHETERIZATION N/A 06/23/2016   Procedure: Abdominal Aortogram w/Lower Extremity;  Surgeon: Serafina Mitchell, MD;  Location: [place] CV LAB;  Service: Cardiovascular;  Laterality: N/A;  . PERIPHERAL VASCULAR CATHETERIZATION  06/23/2016   Procedure: Peripheral Vascular Intervention;  Surgeon: Serafina Mitchell, MD;  Location: [place] CV LAB;  Service: Cardiovascular;;  lt common and external illiac artery      reports that she quit smoking about 4 years ago. Her smoking use included Cigarettes. She has a 14.00 pack-year smoking history. She has never used smokeless tobacco. She  reports that she uses drugs. She reports that she does not drink alcohol. Social History   Social History  . Marital status: Married    Spouse name: [name]  . Number of children: 4  . Years of education: some collg   Occupational History  . Retired    Social History Main Topics  . Smoking status: Former Smoker    Packs/day: 0.35    Years: 40.00    Types: Cigarettes    Quit date: 05/10/2012  . Smokeless tobacco: Never Used  . Alcohol use No  . Drug use:      Comment: marijuana; quit in early 1980's  . Sexual activity: Yes   Other Topics Concern  . Not on file   Social History Narrative   Patient lives at home with spouse.   Caffeine Use: 1 cup daily    Allergies  Allergen Reactions  . Lisinopril Cough  . Prednisone Other (See Comments)    Excessive fluid buildup    Pertinent  Health Maintenance Due  Topic Date Due  . FOOT EXAM  03/05/1961  . OPHTHALMOLOGY EXAM  03/05/1961  . URINE MICROALBUMIN  03/05/1961  . PAP SMEAR  03/05/1972  . COLONOSCOPY  03/05/2001  . MAMMOGRAM  03/01/2015  . INFLUENZA VACCINE  02/25/2016  . DEXA SCAN  03/05/2016  . PNA vac Low Risk Adult (1 of 2 - PCV13) 03/05/2016  . HEMOGLOBIN A1C  12/22/2016    Medications: Allergies as of 07/23/2016      Reactions   Lisinopril Cough   Prednisone Other (See Comments)   Excessive fluid buildup      Medication List       Accurate as of 07/23/16  3:13 PM. Always use your most recent med list.          albuterol 108 (90 Base) MCG/ACT inhaler Commonly known as:  PROVENTIL HFA;VENTOLIN HFA Inhale 1-2 puffs into the lungs every 6 (six) hours as needed for wheezing or shortness of breath.   ALPRAZolam 0.25 MG tablet Commonly known as:  XANAX Take 0.25 mg by mouth daily.   amLODipine 10 MG tablet Commonly known as:  NORVASC Take 10 mg by mouth at bedtime.   aspirin 81 MG chewable tablet Chew 81 mg by mouth every evening.   atorvastatin 20 MG tablet Commonly known as:   LIPITOR Take 20 mg by mouth at bedtime.   AURYXIA 1 GM 210 MG(Fe) tablet Generic drug:  ferric citrate Take 630 mg by mouth 3 (three) times daily with meals.   bisacodyl 10 MG suppository Commonly known as:  DULCOLAX Place 10 mg rectally every three (3) days as needed for moderate constipation (If no BM).   budesonide-formoterol 160-4.5 MCG/ACT inhaler Commonly known as:  SYMBICORT Inhale 1 puff into the lungs every evening.   carvedilol 25 MG tablet Commonly known as:  COREG Take 25 mg by mouth. Take BID on Tue-Thu-Sat-Sun   carvedilol 25 MG tablet Commonly known as:  COREG Take 25 mg by mouth. Take at 5PM on dialysis days of M-W-F   cinacalcet 30 MG tablet Commonly known as:  SENSIPAR Take 30 mg by mouth every evening.   clopidogrel 75 MG tablet Commonly known as:  PLAVIX Take 1 tablet (75 mg total) by mouth every evening.   docusate sodium 100 MG capsule Commonly known as:  COLACE Take 100 mg by mouth 2 (two) times daily.   feeding supplement (NEPRO CARB STEADY) Liqd Take 237 mLs by mouth 2 (two) times daily between meals.   fluticasone 50 MCG/ACT nasal spray Commonly known as:  FLONASE Place 1 spray into both nostrils daily as needed for allergies or rhinitis.   HUMALOG 100 UNIT/ML injection Generic drug:  insulin lispro Inject 0-8 Units into the skin 3 (three) times daily before meals. 0-59 = hypoglycemic protocol, 60-149 = 0 units, 150-250 = 5 units, 251-300 = 8 units, >301 notify MD/NP   isosorbide-hydrALAZINE 20-37.5 MG tablet Commonly known as:  BIDIL Take 1 tablet by mouth 2 (two) times daily.   LEVEMIR FLEXTOUCH 100 UNIT/ML Pen Generic drug:  Insulin Detemir Inject 10 Units into the skin at bedtime.   loratadine 10 MG tablet Commonly known as:  CLARITIN Take 10 mg by mouth daily as needed for allergies.   multivitamin Tabs tablet Take 1 tablet by mouth every evening.   mupirocin ointment 2 % Commonly known as:  BACTROBAN Apply 1 application  topically 2 (two) times daily.   omeprazole 20 MG capsule Commonly known as:  PRILOSEC Take 20 mg by mouth daily.   ondansetron 4 MG tablet Commonly known as:  ZOFRAN Take  4 mg by mouth every 6 (six) hours as needed for nausea or vomiting. Offer 1 hour prior to meals   oxyCODONE-acetaminophen 5-325 MG tablet Commonly known as:  PERCOCET/ROXICET Take 1 tablet by mouth every 8 (eight) hours as needed for moderate pain.   pantoprazole 40 MG tablet Commonly known as:  PROTONIX Take 40 mg by mouth 2 (two) times daily.   Vitamin D (Ergocalciferol) 50000 units Caps capsule Commonly known as:  DRISDOL Take 50,000 Units by mouth every 7 (seven) days. Mondays       Review of Systems  Constitutional: Negative for activity change, appetite change, chills and fever.  HENT: Negative for congestion, rhinorrhea, sinus pain, sinus pressure and sore throat.   Eyes: Negative.   Respiratory: Negative for cough, chest tightness, shortness of breath and wheezing.   Gastrointestinal: Negative for abdominal pain, constipation, diarrhea, nausea and vomiting.  Endocrine: Negative for cold intolerance, heat intolerance, polydipsia, polyphagia and polyuria.  Genitourinary: Negative for frequency and urgency.       ESRD on dialysis Mon, Wed and Fri  Musculoskeletal: Positive for gait problem.  Skin: Negative for color change, pallor and rash.       Left toe drsg dry, clean and intact  Neurological: Negative for dizziness, seizures, syncope, light-headedness and headaches.  Hematological: Does not bruise/bleed easily.  Psychiatric/Behavioral: Negative for agitation, confusion, hallucinations and sleep disturbance. The patient is not nervous/anxious.     Vitals:   07/23/16 0831  BP: 134/62  Pulse: 88  Resp: 18  Temp: 98.6 F (37 C)  TempSrc: Oral  SpO2: 98%  Weight: 136 lb (61.7 kg)  Height: 5\' 5"  (1.651 m)   Body mass index is 22.63 kg/m. Physical Exam  Constitutional: She is oriented to  person, place, and time. She appears well-developed and well-nourished. No distress.  HENT:  Head: Normocephalic.  Mouth/Throat: Oropharynx is clear and moist. No oropharyngeal exudate.  Eyes: Conjunctivae and EOM are normal. Pupils are equal, round, and reactive to light. Right eye exhibits no discharge. Left eye exhibits no discharge. No scleral icterus.  Neck: Normal range of motion. No JVD present. No thyromegaly present.  Cardiovascular: Normal rate, regular rhythm, normal heart sounds and intact distal pulses.  Exam reveals no gallop and no friction rub.   No murmur heard. Pulmonary/Chest: Effort normal and breath sounds normal. No respiratory distress. She has no wheezes. She has no rales.  Abdominal: Soft. Bowel sounds are normal. She exhibits no distension. There is no tenderness. There is no rebound and no guarding.  Musculoskeletal: She exhibits no tenderness or deformity.  Moves x 4 extremities. trace edema to left foot.   Lymphadenopathy:    She has no cervical adenopathy.  Neurological: She is oriented to person, place, and time.  Skin: Skin is warm and dry. No rash noted. No erythema. No pallor.  Left toe drsg dry, clean and intact. Graft site incision intact without any signs of infections.   Psychiatric: She has a normal mood and affect.    Labs reviewed: Basic Metabolic Panel:  Recent Labs  10/07/15 0347  10/12/15 1601 10/14/15 0942  07/11/16 0338 07/12/16 0317 07/13/16 0652 07/15/16  NA  --   < > 136 135  < > 132* 130* 129* 135*  K  --   < > 4.8 4.2  < > 3.8 4.7 5.0 4.5  CL  --   < > 97* 97*  < > 93* 91* 89*  --   CO2  --   < >  27 24  < > 25 25 25   --   GLUCOSE  --   < > 194* 150*  < > 130* 177* 90  --   BUN  --   < > 40* 28*  < > 18 29* 41* 30*  CREATININE  --   < > 3.48* 3.83*  < > 4.57* 6.06* 7.89* 6.0*  CALCIUM  --   < > 9.4 9.9  < > 8.6* 8.4* 8.6*  --   MG 1.7  --   --   --   --   --   --   --   --   PHOS  --   < > 3.4 3.5  --   --   --  4.3  --   < >  = values in this interval not displayed. Liver Function Tests:  Recent Labs  10/09/15 1952 10/10/15 0523  10/14/15 0942 07/07/16 0915 07/13/16 0652  AST 27 20  --   --  21  --   ALT 25 23  --   --  14  --   ALKPHOS 137* 119  --   --  120  --   BILITOT 0.6 0.5  --   --  0.3  --   PROT 7.5 6.8  --   --  7.7  --   ALBUMIN 3.3* 3.1*  < > 3.2* 3.2* 2.6*  < > = values in this interval not displayed.  CBC:  Recent Labs  10/09/15 1952  10/10/15 0523  10/12/15 1600  07/11/16 0338 07/12/16 0317 07/13/16 0652 07/13/16 0852 07/15/16  WBC 7.6  < > 7.9  < > 7.4  < > 18.6* 15.3* 3.3*  --  12.1  NEUTROABS 4.7  --  5.3  --  3.8  --   --   --   --   --   --   HGB 10.3*  < > 9.3*  < > 8.8*  < > 8.6* 8.2* 7.5* 7.5* 9.0*  HCT 31.6*  < > 29.4*  < > 27.5*  < > 26.2* 25.3* 23.0* 22.6* 28*  MCV 92.9  < > 92.5  < > 92.0  < > 91.3 92.0 91.6  --   --   PLT 287  < > 261  < > 252  < > 296 322 297  --  332  < > = values in this interval not displayed. Cardiac Enzymes:  Recent Labs  10/10/15 0125 10/10/15 0523 10/10/15 1312  TROPONINI 0.04* 0.04* 0.04*     Recent Labs  07/12/16 2100 07/13/16 0608 07/13/16 1344  GLUCAP 218* 95 81   Assessment/Plan:   1. Hypertensive heart disease with heart failure (HCC) B/p stable. Continue on Amlodipine, Coreg and Bidil. BMP in 1-2 weeks with PCP.   2. Chronic diastolic CHF (congestive heart failure) (HCC) No recent abrupt weight gain.Exam findings negative for wheezing or shortness of breath. Continue to monitor weight.    3. PAD (peripheral artery disease) ([place]) Status post hospital admission from 07/08/16-07/13/16 with PVD with LLE pain and gangrene to her toes. She underwent left femoral to below knee popliteal bypass with graft on 07/08/16. Continue on ASA and Plavix. Has upcoming appointment with Vascular specialist  Dr. Sherren Mocha Early on 07/28/2016.   4. Type 2 diabetes mellitus with diabetic nephropathy, with long-term current use of insulin  (HCC) CBG's ranging 90's-200's. Continue on Humalog per SSI and Levemir.Continue on ASA, amlodipine and Atorvastatin. Monitor Hgb A1C.  5. CKD (chronic kidney disease) stage 4, GFR 15-29 ml/min (HCC) On Hemodialysis three times per week on Mon, wed and Fri.   6. Mixed hyperlipidemia Continue on Atorvastatin.   7. Gangrene of toe of left foot ([place]) post hospital admission from 07/08/16-07/13/16 with PVD with LLE pain and gangrene to her toes. She underwent left femoral to below knee popliteal bypass with graft on 07/08/16. Will discharge home with [facility] RN for left 4th and 5 th digit " Kissing" ulcer between toes management. Has upcoming appointment with Ortho Dr. Sharol Given 07/30/2016. Continue to monitor.   8. Unsteady gait Has worked with PT/OT with much improvement. Will Discharge home with PT/OT to continue with ROM, Exercise, gait stability and muscle strengthening. No DME required has own Rolator. Fall and safety precautions.   Patient is being discharged with the following home health services:    -PT/OT to continue with ROM, Exercise, gait stability and muscle strengthening. -[facility] RN for left 4th and 5 th digit " Kissing" ulcer between toes management.  Patient is being discharged with the following durable medical equipment:   -No DME required has own Rolator.  Patient has been advised to f/u with their PCP in 1-2 weeks to for a transitions of care visit.  Social services at their facility was responsible for arranging this appointment.  Pt was provided with adequate prescriptions of noncontrolled medications to reach the scheduled appointment .  For controlled substances, a limited supply was provided as appropriate for the individual patient.  If the pt normally receives these medications from a pain clinic or has a contract with another physician, these medications should be received from that clinic or physician only).    Future labs/tests needed: CBC, BMP in 1-2 weeks with PCP

## 2016-07-24 DIAGNOSIS — N2581 Secondary hyperparathyroidism of renal origin: Secondary | ICD-10-CM | POA: Diagnosis not present

## 2016-07-24 DIAGNOSIS — D631 Anemia in chronic kidney disease: Secondary | ICD-10-CM | POA: Diagnosis not present

## 2016-07-24 DIAGNOSIS — E1122 Type 2 diabetes mellitus with diabetic chronic kidney disease: Secondary | ICD-10-CM | POA: Diagnosis not present

## 2016-07-24 DIAGNOSIS — D509 Iron deficiency anemia, unspecified: Secondary | ICD-10-CM | POA: Diagnosis not present

## 2016-07-24 DIAGNOSIS — N186 End stage renal disease: Secondary | ICD-10-CM | POA: Diagnosis not present

## 2016-07-24 DIAGNOSIS — L03032 Cellulitis of left toe: Secondary | ICD-10-CM | POA: Diagnosis not present

## 2016-07-25 DIAGNOSIS — N186 End stage renal disease: Secondary | ICD-10-CM | POA: Diagnosis not present

## 2016-07-25 DIAGNOSIS — I120 Hypertensive chronic kidney disease with stage 5 chronic kidney disease or end stage renal disease: Secondary | ICD-10-CM | POA: Diagnosis not present

## 2016-07-25 DIAGNOSIS — E1122 Type 2 diabetes mellitus with diabetic chronic kidney disease: Secondary | ICD-10-CM | POA: Diagnosis not present

## 2016-07-25 DIAGNOSIS — I70745 Atherosclerosis of other type of bypass graft(s) of the left leg with ulceration of other part of foot: Secondary | ICD-10-CM | POA: Diagnosis not present

## 2016-07-25 DIAGNOSIS — L97521 Non-pressure chronic ulcer of other part of left foot limited to breakdown of skin: Secondary | ICD-10-CM | POA: Diagnosis not present

## 2016-07-25 DIAGNOSIS — Z992 Dependence on renal dialysis: Secondary | ICD-10-CM | POA: Diagnosis not present

## 2016-07-26 DIAGNOSIS — D631 Anemia in chronic kidney disease: Secondary | ICD-10-CM | POA: Diagnosis not present

## 2016-07-26 DIAGNOSIS — I159 Secondary hypertension, unspecified: Secondary | ICD-10-CM | POA: Diagnosis not present

## 2016-07-26 DIAGNOSIS — D509 Iron deficiency anemia, unspecified: Secondary | ICD-10-CM | POA: Diagnosis not present

## 2016-07-26 DIAGNOSIS — Z992 Dependence on renal dialysis: Secondary | ICD-10-CM | POA: Diagnosis not present

## 2016-07-26 DIAGNOSIS — L03032 Cellulitis of left toe: Secondary | ICD-10-CM | POA: Diagnosis not present

## 2016-07-26 DIAGNOSIS — N186 End stage renal disease: Secondary | ICD-10-CM | POA: Diagnosis not present

## 2016-07-26 DIAGNOSIS — E1122 Type 2 diabetes mellitus with diabetic chronic kidney disease: Secondary | ICD-10-CM | POA: Diagnosis not present

## 2016-07-26 DIAGNOSIS — N2581 Secondary hyperparathyroidism of renal origin: Secondary | ICD-10-CM | POA: Diagnosis not present

## 2016-07-27 DIAGNOSIS — I70745 Atherosclerosis of other type of bypass graft(s) of the left leg with ulceration of other part of foot: Secondary | ICD-10-CM | POA: Diagnosis not present

## 2016-07-27 DIAGNOSIS — N186 End stage renal disease: Secondary | ICD-10-CM | POA: Diagnosis not present

## 2016-07-27 DIAGNOSIS — L97521 Non-pressure chronic ulcer of other part of left foot limited to breakdown of skin: Secondary | ICD-10-CM | POA: Diagnosis not present

## 2016-07-27 DIAGNOSIS — E1122 Type 2 diabetes mellitus with diabetic chronic kidney disease: Secondary | ICD-10-CM | POA: Diagnosis not present

## 2016-07-27 DIAGNOSIS — I120 Hypertensive chronic kidney disease with stage 5 chronic kidney disease or end stage renal disease: Secondary | ICD-10-CM | POA: Diagnosis not present

## 2016-07-27 DIAGNOSIS — Z992 Dependence on renal dialysis: Secondary | ICD-10-CM | POA: Diagnosis not present

## 2016-07-28 ENCOUNTER — Encounter: Payer: Self-pay | Admitting: Vascular Surgery

## 2016-07-28 ENCOUNTER — Ambulatory Visit (INDEPENDENT_AMBULATORY_CARE_PROVIDER_SITE_OTHER): Payer: Medicare Other | Admitting: Vascular Surgery

## 2016-07-28 VITALS — BP 145/71 | HR 95 | Temp 97.6°F | Resp 14 | Ht 65.0 in | Wt 144.0 lb

## 2016-07-28 DIAGNOSIS — I70745 Atherosclerosis of other type of bypass graft(s) of the left leg with ulceration of other part of foot: Secondary | ICD-10-CM | POA: Diagnosis not present

## 2016-07-28 DIAGNOSIS — N186 End stage renal disease: Secondary | ICD-10-CM | POA: Diagnosis not present

## 2016-07-28 DIAGNOSIS — Z992 Dependence on renal dialysis: Secondary | ICD-10-CM | POA: Diagnosis not present

## 2016-07-28 DIAGNOSIS — I120 Hypertensive chronic kidney disease with stage 5 chronic kidney disease or end stage renal disease: Secondary | ICD-10-CM | POA: Diagnosis not present

## 2016-07-28 DIAGNOSIS — L97521 Non-pressure chronic ulcer of other part of left foot limited to breakdown of skin: Secondary | ICD-10-CM | POA: Diagnosis not present

## 2016-07-28 DIAGNOSIS — E1122 Type 2 diabetes mellitus with diabetic chronic kidney disease: Secondary | ICD-10-CM | POA: Diagnosis not present

## 2016-07-28 DIAGNOSIS — I70245 Atherosclerosis of native arteries of left leg with ulceration of other part of foot: Secondary | ICD-10-CM

## 2016-07-28 NOTE — Progress Notes (Signed)
Patient name: Destiny Day MRN: [id number] DOB: 1950/11/27 Sex: female  REASON FOR VISIT: Follow-up recent left femoral to popliteal bypass on 07/08/2016  HPI: Destiny Day is a 66 y.o. female here today for follow-up. Had critical limb ischemia with ulceration of her great and fourth toe. She underwent initial arteriogram with left common and external iliac artery stenting with Dr. Trula Slade on 06/23/2016. She subsequently underwent left femoral to below-knee bypass by myself. She had harvesting of her saphenous vein and actually proximal anastomosis with very poor flow through this vein. This was abandoned and she had prostatic probe patent Gore-Tex graft placement. She did well the hospital was discharged to home.  Current Outpatient Prescriptions  Medication Sig Dispense Refill  . albuterol (PROVENTIL HFA;VENTOLIN HFA) 108 (90 BASE) MCG/ACT inhaler Inhale 1-2 puffs into the lungs every 6 (six) hours as needed for wheezing or shortness of breath.    . ALPRAZolam (XANAX) 0.25 MG tablet Take 0.25 mg by mouth daily.    Marland Kitchen amLODipine (NORVASC) 10 MG tablet Take 10 mg by mouth at bedtime.     Marland Kitchen aspirin 81 MG chewable tablet Chew 81 mg by mouth every evening.     Marland Kitchen atorvastatin (LIPITOR) 20 MG tablet Take 20 mg by mouth at bedtime.     . bisacodyl (DULCOLAX) 10 MG suppository Place 10 mg rectally every three (3) days as needed for moderate constipation (If no BM).    . budesonide-formoterol (SYMBICORT) 160-4.5 MCG/ACT inhaler Inhale 1 puff into the lungs every evening.    . carvedilol (COREG) 25 MG tablet Take 25 mg by mouth. Take BID on Tue-Thu-Sat-Sun    . carvedilol (COREG) 25 MG tablet Take 25 mg by mouth. Take at 5PM on dialysis days of M-W-F    . cinacalcet (SENSIPAR) 30 MG tablet Take 30 mg by mouth every evening.     . clopidogrel (PLAVIX) 75 MG tablet Take 1 tablet (75 mg total) by mouth every evening.    . docusate sodium (COLACE) 100 MG capsule  Take 100 mg by mouth 2 (two) times daily.    . Ferric Citrate (AURYXIA) 1 GM 210 MG(Fe) TABS Take 630 mg by mouth 3 (three) times daily with meals.    . fluticasone (FLONASE) 50 MCG/ACT nasal spray Place 1 spray into both nostrils daily as needed for allergies or rhinitis.    Marland Kitchen insulin lispro (HUMALOG) 100 UNIT/ML injection Inject 0-8 Units into the skin 3 (three) times daily before meals. 0-59 = hypoglycemic protocol, 60-149 = 0 units, 150-250 = 5 units, 251-300 = 8 units, >301 notify MD/NP    . isosorbide-hydrALAZINE (BIDIL) 20-37.5 MG tablet Take 1 tablet by mouth 2 (two) times daily. 60 tablet 0  . LEVEMIR FLEXTOUCH 100 UNIT/ML Pen Inject 10 Units into the skin at bedtime.     Marland Kitchen loratadine (CLARITIN) 10 MG tablet Take 10 mg by mouth daily as needed for allergies.    . multivitamin (RENA-VIT) TABS tablet Take 1 tablet by mouth every evening.   5  . mupirocin ointment (BACTROBAN) 2 % Apply 1 application topically 2 (two) times daily. 22 g 6  . Nutritional Supplements (FEEDING SUPPLEMENT, NEPRO CARB STEADY,) LIQD Take 237 mLs by mouth 2 (two) times daily between meals.  0  . omeprazole (PRILOSEC) 20 MG capsule Take 20 mg by mouth daily.     . pantoprazole (PROTONIX) 40 MG tablet Take 40 mg by mouth 2 (two) times daily.    . Vitamin D, Ergocalciferol, (  DRISDOL) 50000 units CAPS capsule Take 50,000 Units by mouth every 7 (seven) days. Mondays  3  . ondansetron (ZOFRAN) 4 MG tablet Take 4 mg by mouth every 6 (six) hours as needed for nausea or vomiting. Offer 1 hour prior to meals    . oxyCODONE-acetaminophen (PERCOCET/ROXICET) 5-325 MG tablet Take 1 tablet by mouth every 8 (eight) hours as needed for moderate pain. (Patient not taking: Reported on 07/28/2016) 15 tablet 0   No current facility-administered medications for this visit.      PHYSICAL EXAM: Vitals:   07/28/16 0852 07/28/16 0855  BP: (!) 158/73 (!) 145/71  Pulse: 95 95  Resp: 14   Temp: 97.6 F (36.4 C)   Weight: 144 lb (65.3 kg)    Height: 5\' 5"  (1.651 m)     GENERAL: The patient is a well-nourished female, in no acute distress. The vital signs are documented above. Her groin vein harvest and below-knee popliteal incisions are all healing. There is a slight area of wound separation in the incision at the top with some mild fat necrosis below this and no subcutaneous abscess. She does have 2-3+ popliteal graft pulse. Moderate swelling in her foot. She does have dry gangrene of her fourth toe and on the very tip of her great toe with no surrounding erythema  MEDICAL ISSUES: Stable overall. We'll continue her walking program with no limitation. We will see me again in 6 weeks with noninvasive studies. She is to see Dr. Sharol Given later this week regarding her toes. I did discuss the potential continued observation for autoamputation of these areas. Also explained that he may feel that a surgical amputation of these is warranted. We will see her again in 6 weeks   Rosetta Posner, MD [facility] Tel [phone] Pager [phone]

## 2016-07-28 NOTE — Progress Notes (Signed)
Vitals:   07/28/16 0852  BP: (!) 158/73  Pulse: 95  Resp: 14  Temp: 97.6 F (36.4 C)  Weight: 144 lb (65.3 kg)  Height: 5\' 5"  (1.651 m)

## 2016-07-29 DIAGNOSIS — D631 Anemia in chronic kidney disease: Secondary | ICD-10-CM | POA: Diagnosis not present

## 2016-07-29 DIAGNOSIS — E46 Unspecified protein-calorie malnutrition: Secondary | ICD-10-CM | POA: Insufficient documentation

## 2016-07-29 DIAGNOSIS — D509 Iron deficiency anemia, unspecified: Secondary | ICD-10-CM | POA: Diagnosis not present

## 2016-07-29 DIAGNOSIS — E1122 Type 2 diabetes mellitus with diabetic chronic kidney disease: Secondary | ICD-10-CM | POA: Diagnosis not present

## 2016-07-29 DIAGNOSIS — N186 End stage renal disease: Secondary | ICD-10-CM | POA: Diagnosis not present

## 2016-07-29 DIAGNOSIS — N2581 Secondary hyperparathyroidism of renal origin: Secondary | ICD-10-CM | POA: Diagnosis not present

## 2016-07-29 NOTE — Addendum Note (Signed)
Addended by: Lianne Cure A on: 07/29/2016 09:19 AM   Modules accepted: Orders

## 2016-07-30 ENCOUNTER — Encounter (INDEPENDENT_AMBULATORY_CARE_PROVIDER_SITE_OTHER): Payer: Self-pay | Admitting: Orthopedic Surgery

## 2016-07-30 ENCOUNTER — Ambulatory Visit (INDEPENDENT_AMBULATORY_CARE_PROVIDER_SITE_OTHER): Payer: Medicare Other | Admitting: Orthopedic Surgery

## 2016-07-30 VITALS — Ht 65.0 in | Wt 144.0 lb

## 2016-07-30 DIAGNOSIS — I70262 Atherosclerosis of native arteries of extremities with gangrene, left leg: Secondary | ICD-10-CM | POA: Diagnosis not present

## 2016-07-30 NOTE — Progress Notes (Signed)
Office Visit Note   Patient: Destiny Day           Date of Birth: 1950-12-06           MRN: [id number] Visit Date: 07/30/2016              Requested by: Velna Hatchet, MD [place], [place] [address] PCP: Velna Hatchet, MD  Chief Complaint  Patient presents with  . Left Foot - Follow-up    Dry gangrene GT &4th toe    HPI: Patient presents for repeat evaluation of left foot with dry gangrene 4th and GT. She was just seen by Dr. Luther Parody office on 07/28/16. She has no drainage today, no odor, minimal swelling. Doing dry dressing changes daily. [facility] is coming out for nursing and therapy twice a week. Maxcine Ham, RT  Patient states that due to her recent surgery she is not interested in any type of surgery at this time. She is status post bypass surgery as well as dialysis access.  Assessment & Plan: Visit Diagnoses:  1. Atherosclerosis of native artery of left lower extremity with gangrene ([place])     Plan: Follow-up in 4 weeks. Patient also has a follow-up appointment Dr. early in 4 weeks. Discussed that if she develops any infection or drainage or odor from her foot to go the emergency room immediately we would need to proceed with urgent surgery. Have recommended proceeding with a transmetatarsal amputation and anticipate scheduling at follow-up.  Follow-Up Instructions: Return in about 4 weeks (around 08/27/2016).   Physical Exam: On examination patient is alert oriented no adenopathy well-dressed normal affect numerous [place] she does have an antalgic gait. Examination patient has had repeat vascular is a shin to the left lower extremity with excellent inflow. She has a strong dorsalis pedis pulse. She has thin atrophic skin but there is no cellulitis no drainage no odor. She has dry gangrene involving the great toe and fourth toe.  Imaging: No results found.  Orders:  No orders of the defined types were placed in this encounter.  No  orders of the defined types were placed in this encounter.    Procedures: No procedures performed  Clinical Data: No additional findings.  Subjective: Review of Systems  Objective: Vital Signs: Ht 5\' 5"  (1.651 m)   Wt 144 lb (65.3 kg)   BMI 23.96 kg/m   Specialty Comments:  No specialty comments available.  PMFS History: Patient Active Problem List   Diagnosis Date Noted  . Gangrene of toe of left foot ([place])   . PAD (peripheral artery disease) ([place]) 07/08/2016  . Atherosclerosis of native artery of left lower extremity with gangrene ([place]) 06/23/2016  . GERD (gastroesophageal reflux disease) 10/07/2015  . ARF (acute renal failure) ([place])   . Hypothermia 06/12/2015  . Acute on chronic renal failure ([place]) 06/12/2015  . CHF (congestive heart failure) ([place]) 07/30/2014  . CKD (chronic kidney disease) stage 4, GFR 15-29 ml/min (HCC) 06/27/2014  . Hypertensive heart disease 05/25/2013  . CKD (chronic kidney disease) stage 3, GFR 30-59 ml/min 05/25/2013  . Pulmonary edema 05/23/2013  . Type 2 diabetes mellitus with diabetic nephropathy ([place]) 05/23/2013  . Type 2 diabetes mellitus with hyperosmolar nonketotic hyperglycemia ([place]) 04/13/2013  . Chronic diastolic CHF (congestive heart failure) ([place]) 04/13/2013  . UGI bleed 03/31/2013  . Hypokalemia 08/24/2012  . Type II or unspecified type diabetes mellitus without mention of complication, not stated as uncontrolled 12/27/2007  . HLD (hyperlipidemia)  12/27/2007  . ALLERGIC RHINITIS 12/27/2007  . Asthma 12/27/2007   Past Medical History:  Diagnosis Date  . Abdominal bruit   . Anemia   . Anxiety   . Arthritis    Osteoarthritis  . Asthma   . Cervical disc disease    "pinced nerve"  . CHF (congestive heart failure) ([place])   . Complication of anesthesia    " after I got home from my last procedure, I started itching."  . Diabetes mellitus    insulin dependent  . Diverticulitis   . ESRD on peritoneal dialysis [facility])     Hemodialysis - MWF  . GERD (gastroesophageal reflux disease)    from medications  . GI bleed 03/31/2013  . History of hiatal hernia   . Hyperlipidemia   . Hypertension   . Osteoporosis   . Pneumonia   . Seasonal allergies   . Shortness of breath dyspnea    WIth exertion  . Sleep apnea    can't afford cpap    Family History  Problem Relation Age of Onset  . Other Mother     not sure of cause of death  . Diabetes Father   . Pancreatic cancer Maternal Grandmother   . Colon cancer Neg Hx     Past Surgical History:  Procedure Laterality Date  . ABDOMINAL HYSTERECTOMY  1993`  . AV FISTULA PLACEMENT Left 04/21/2016   Procedure: INSERTION OF ARTERIOVENOUS (AV) GORE-TEX GRAFT ARM LEFT;  Surgeon: Elam Dutch, MD;  Location: [place];  Service: Vascular;  Laterality: Left;  . [place] TRANSPOSITION Left 07/10/2014   Procedure: BASCILIC VEIN TRANSPOSITION;  Surgeon: Angelia Mould, MD;  Location: [place];  Service: Vascular;  Laterality: Left;  . BASCILIC VEIN TRANSPOSITION Right 11/08/2014   Procedure: FIRST STAGE BASILIC VEIN TRANSPOSITION;  Surgeon: Angelia Mould, MD;  Location: [place];  Service: Vascular;  Laterality: Right;  . BASCILIC VEIN TRANSPOSITION Right 01/18/2015   Procedure: SECOND STAGE BASILIC VEIN TRANSPOSITION;  Surgeon: Angelia Mould, MD;  Location: [facility] OR;  Service: Vascular;  Laterality: Right;  . ESOPHAGOGASTRODUODENOSCOPY N/A 03/31/2013   Procedure: ESOPHAGOGASTRODUODENOSCOPY (EGD);  Surgeon: Gatha Mayer, MD;  Location: [facility] ENDOSCOPY;  Service: Endoscopy;  Laterality: N/A;  . EYE SURGERY     laser surgery  . FEMORAL-POPLITEAL BYPASS GRAFT Left 07/08/2016   Procedure: LEFT  FEMORAL-BELOW KNEE POPLITEAL ARTERY BYPASS GRAFT USING 6MM X 80 CM PROPATEN GORETEX GRAFT WITH RINGS.;  Surgeon: Rosetta Posner, MD;  Location: [place];  Service: Vascular;  Laterality: Left;  . FISTULOGRAM Left 10/29/2014   Procedure: FISTULOGRAM;  Surgeon: Angelia Mould, MD;  Location: [facility] CATH LAB;  Service: Cardiovascular;  Laterality: Left;  . LIGATION OF ARTERIOVENOUS  FISTULA Left 04/21/2016   Procedure: LIGATION OF ARTERIOVENOUS  FISTULA LEFT ARM;  Surgeon: Elam Dutch, MD;  Location: [place];  Service: Vascular;  Laterality: Left;  . PATCH ANGIOPLASTY Right 01/18/2015   Procedure: BASILIC VEIN PATCH ANGIOPLASTY USING VASCUGUARD PATCH;  Surgeon: Angelia Mould, MD;  Location: [place];  Service: Vascular;  Laterality: Right;  . PERIPHERAL VASCULAR CATHETERIZATION N/A 06/23/2016   Procedure: Abdominal Aortogram w/Lower Extremity;  Surgeon: Serafina Mitchell, MD;  Location: [place] CV LAB;  Service: Cardiovascular;  Laterality: N/A;  . PERIPHERAL VASCULAR CATHETERIZATION  06/23/2016   Procedure: Peripheral Vascular Intervention;  Surgeon: Serafina Mitchell, MD;  Location: [place] CV LAB;  Service: Cardiovascular;;  lt common and external illiac artery  Social History   Occupational History  . Retired    Social History Main Topics  . Smoking status: Former Smoker    Packs/day: 0.35    Years: 40.00    Types: Cigarettes    Quit date: 05/10/2012  . Smokeless tobacco: Never Used  . Alcohol use No  . Drug use:      Comment: marijuana; quit in early 1980's  . Sexual activity: Yes

## 2016-07-31 DIAGNOSIS — D631 Anemia in chronic kidney disease: Secondary | ICD-10-CM | POA: Diagnosis not present

## 2016-07-31 DIAGNOSIS — D509 Iron deficiency anemia, unspecified: Secondary | ICD-10-CM | POA: Diagnosis not present

## 2016-07-31 DIAGNOSIS — E1122 Type 2 diabetes mellitus with diabetic chronic kidney disease: Secondary | ICD-10-CM | POA: Diagnosis not present

## 2016-07-31 DIAGNOSIS — N186 End stage renal disease: Secondary | ICD-10-CM | POA: Diagnosis not present

## 2016-07-31 DIAGNOSIS — N2581 Secondary hyperparathyroidism of renal origin: Secondary | ICD-10-CM | POA: Diagnosis not present

## 2016-08-01 DIAGNOSIS — I120 Hypertensive chronic kidney disease with stage 5 chronic kidney disease or end stage renal disease: Secondary | ICD-10-CM | POA: Diagnosis not present

## 2016-08-01 DIAGNOSIS — E1122 Type 2 diabetes mellitus with diabetic chronic kidney disease: Secondary | ICD-10-CM | POA: Diagnosis not present

## 2016-08-01 DIAGNOSIS — I70745 Atherosclerosis of other type of bypass graft(s) of the left leg with ulceration of other part of foot: Secondary | ICD-10-CM | POA: Diagnosis not present

## 2016-08-01 DIAGNOSIS — L97521 Non-pressure chronic ulcer of other part of left foot limited to breakdown of skin: Secondary | ICD-10-CM | POA: Diagnosis not present

## 2016-08-01 DIAGNOSIS — Z992 Dependence on renal dialysis: Secondary | ICD-10-CM | POA: Diagnosis not present

## 2016-08-01 DIAGNOSIS — N186 End stage renal disease: Secondary | ICD-10-CM | POA: Diagnosis not present

## 2016-08-03 DIAGNOSIS — N186 End stage renal disease: Secondary | ICD-10-CM | POA: Diagnosis not present

## 2016-08-03 DIAGNOSIS — D631 Anemia in chronic kidney disease: Secondary | ICD-10-CM | POA: Diagnosis not present

## 2016-08-03 DIAGNOSIS — D509 Iron deficiency anemia, unspecified: Secondary | ICD-10-CM | POA: Diagnosis not present

## 2016-08-03 DIAGNOSIS — N2581 Secondary hyperparathyroidism of renal origin: Secondary | ICD-10-CM | POA: Diagnosis not present

## 2016-08-03 DIAGNOSIS — E1122 Type 2 diabetes mellitus with diabetic chronic kidney disease: Secondary | ICD-10-CM | POA: Diagnosis not present

## 2016-08-04 DIAGNOSIS — E1122 Type 2 diabetes mellitus with diabetic chronic kidney disease: Secondary | ICD-10-CM | POA: Diagnosis not present

## 2016-08-04 DIAGNOSIS — I120 Hypertensive chronic kidney disease with stage 5 chronic kidney disease or end stage renal disease: Secondary | ICD-10-CM | POA: Diagnosis not present

## 2016-08-04 DIAGNOSIS — N186 End stage renal disease: Secondary | ICD-10-CM | POA: Diagnosis not present

## 2016-08-04 DIAGNOSIS — I70745 Atherosclerosis of other type of bypass graft(s) of the left leg with ulceration of other part of foot: Secondary | ICD-10-CM | POA: Diagnosis not present

## 2016-08-04 DIAGNOSIS — L97521 Non-pressure chronic ulcer of other part of left foot limited to breakdown of skin: Secondary | ICD-10-CM | POA: Diagnosis not present

## 2016-08-04 DIAGNOSIS — Z992 Dependence on renal dialysis: Secondary | ICD-10-CM | POA: Diagnosis not present

## 2016-08-05 DIAGNOSIS — N186 End stage renal disease: Secondary | ICD-10-CM | POA: Diagnosis not present

## 2016-08-05 DIAGNOSIS — D631 Anemia in chronic kidney disease: Secondary | ICD-10-CM | POA: Diagnosis not present

## 2016-08-05 DIAGNOSIS — E1122 Type 2 diabetes mellitus with diabetic chronic kidney disease: Secondary | ICD-10-CM | POA: Diagnosis not present

## 2016-08-05 DIAGNOSIS — D509 Iron deficiency anemia, unspecified: Secondary | ICD-10-CM | POA: Diagnosis not present

## 2016-08-05 DIAGNOSIS — N2581 Secondary hyperparathyroidism of renal origin: Secondary | ICD-10-CM | POA: Diagnosis not present

## 2016-08-06 ENCOUNTER — Ambulatory Visit (INDEPENDENT_AMBULATORY_CARE_PROVIDER_SITE_OTHER): Payer: Medicare Other | Admitting: Family

## 2016-08-06 ENCOUNTER — Encounter: Payer: Self-pay | Admitting: Family

## 2016-08-06 VITALS — BP 159/70 | HR 90 | Temp 98.0°F | Resp 16 | Ht 65.0 in | Wt 139.0 lb

## 2016-08-06 DIAGNOSIS — Z992 Dependence on renal dialysis: Secondary | ICD-10-CM | POA: Diagnosis not present

## 2016-08-06 DIAGNOSIS — I70745 Atherosclerosis of other type of bypass graft(s) of the left leg with ulceration of other part of foot: Secondary | ICD-10-CM | POA: Diagnosis not present

## 2016-08-06 DIAGNOSIS — I120 Hypertensive chronic kidney disease with stage 5 chronic kidney disease or end stage renal disease: Secondary | ICD-10-CM | POA: Diagnosis not present

## 2016-08-06 DIAGNOSIS — L97521 Non-pressure chronic ulcer of other part of left foot limited to breakdown of skin: Secondary | ICD-10-CM | POA: Diagnosis not present

## 2016-08-06 DIAGNOSIS — T8131XA Disruption of external operation (surgical) wound, not elsewhere classified, initial encounter: Secondary | ICD-10-CM

## 2016-08-06 DIAGNOSIS — E1122 Type 2 diabetes mellitus with diabetic chronic kidney disease: Secondary | ICD-10-CM | POA: Diagnosis not present

## 2016-08-06 DIAGNOSIS — E1151 Type 2 diabetes mellitus with diabetic peripheral angiopathy without gangrene: Secondary | ICD-10-CM

## 2016-08-06 DIAGNOSIS — Z95828 Presence of other vascular implants and grafts: Secondary | ICD-10-CM

## 2016-08-06 DIAGNOSIS — N186 End stage renal disease: Secondary | ICD-10-CM | POA: Diagnosis not present

## 2016-08-06 DIAGNOSIS — Z87891 Personal history of nicotine dependence: Secondary | ICD-10-CM

## 2016-08-06 DIAGNOSIS — I70245 Atherosclerosis of native arteries of left leg with ulceration of other part of foot: Secondary | ICD-10-CM

## 2016-08-06 MED ORDER — CEPHALEXIN 500 MG PO CAPS: 500.0000 mg | ORAL_CAPSULE | Freq: Two times a day (BID) | ORAL | 0 refills | Status: DC

## 2016-08-06 NOTE — Progress Notes (Signed)
Postoperative Visit   History of Present Illness  Destiny Day is a 66 y.o. year old female who had left critical limb ischemia with ulceration of her great and fourth toe. She underwent initial arteriogram with left common and external iliac artery stenting with Dr. Trula Slade on 06/23/2016.  She subsequently underwent left femoral to below-knee bypass by Dr. Donnetta Hutching. She had harvesting of her saphenous vein and proximal anastomosis with very poor flow through this vein. This was abandoned and she had left femoral to popliteal bypass prosthetic Gore-Tex graft placement on 07-08-16. She did well the hospital was discharged to home.  She returns today with concerns re her left groin since she started wearing her panties a few days ago and seems to be irritating her left groin, wound is opening, odor to wound. Left leg medial thigh and calf incisions are healing well. She denies fever or chills.   She has an appointment with Dr. Sharol Given on 08-13-16 and has decided to go with Dr. Jess Barters recommendation to have a left foot transmetatarsal amputation; [facility] is currently dressing her toe wounds twice/week.   She has a follow up appointment with non invasive vascular lab testing on 08-15-16 and with Dr. Donnetta Hutching.  She has a left upper arm AV graft placed in September 2017 by Dr. Oneida Alar, she dialyzes M-W-F.  The patient notes resolution of left foot pain.  The patient is able to complete their activities of daily living.    For VQI Use Only  PRE-ADM LIVING: Home  AMB STATUS: Ambulatory    Past Medical History:  Diagnosis Date  . Abdominal bruit   . Anemia   . Anxiety   . Arthritis    Osteoarthritis  . Asthma   . Cervical disc disease    "pinced nerve"  . CHF (congestive heart failure) ([place])   . Complication of anesthesia    " after I got home from my last procedure, I started itching."  . Diabetes mellitus    insulin dependent  . Diverticulitis   . ESRD on peritoneal dialysis  [facility])    Hemodialysis - MWF  . GERD (gastroesophageal reflux disease)    from medications  . GI bleed 03/31/2013  . History of hiatal hernia   . Hyperlipidemia   . Hypertension   . Osteoporosis   . Pneumonia   . Seasonal allergies   . Shortness of breath dyspnea    WIth exertion  . Sleep apnea    can't afford cpap   Past Surgical History:  Procedure Laterality Date  . ABDOMINAL HYSTERECTOMY  1993`  . AV FISTULA PLACEMENT Left 04/21/2016   Procedure: INSERTION OF ARTERIOVENOUS (AV) GORE-TEX GRAFT ARM LEFT;  Surgeon: Elam Dutch, MD;  Location: [place];  Service: Vascular;  Laterality: Left;  . [place] TRANSPOSITION Left 07/10/2014   Procedure: BASCILIC VEIN TRANSPOSITION;  Surgeon: Angelia Mould, MD;  Location: [place];  Service: Vascular;  Laterality: Left;  . BASCILIC VEIN TRANSPOSITION Right 11/08/2014   Procedure: FIRST STAGE BASILIC VEIN TRANSPOSITION;  Surgeon: Angelia Mould, MD;  Location: [place];  Service: Vascular;  Laterality: Right;  . BASCILIC VEIN TRANSPOSITION Right 01/18/2015   Procedure: SECOND STAGE BASILIC VEIN TRANSPOSITION;  Surgeon: Angelia Mould, MD;  [facility] OR;  Service: Vascular;  Laterality: Right;  . ESOPHAGOGASTRODUODENOSCOPY N/A 03/31/2013   Procedure: ESOPHAGOGASTRODUODENOSCOPY (EGD);  Surgeon: Gatha Mayer, MD;  Location: [facility] ENDOSCOPY;  Service: Endoscopy;  Laterality: N/A;  . EYE SURGERY  laser surgery  . FEMORAL-POPLITEAL BYPASS GRAFT Left 07/08/2016   Procedure: LEFT  FEMORAL-BELOW KNEE POPLITEAL ARTERY BYPASS GRAFT USING 6MM X 80 CM PROPATEN GORETEX GRAFT WITH RINGS.;  Surgeon: Rosetta Posner, MD;  Location: [place];  Service: Vascular;  Laterality: Left;  . FISTULOGRAM Left 10/29/2014   Procedure: FISTULOGRAM;  Surgeon: Angelia Mould, MD;  Location: [facility] CATH LAB;  Service: Cardiovascular;  Laterality: Left;  . LIGATION OF ARTERIOVENOUS  FISTULA Left 04/21/2016   Procedure: LIGATION OF ARTERIOVENOUS  FISTULA LEFT  ARM;  Surgeon: Elam Dutch, MD;  Location: [place];  Service: Vascular;  Laterality: Left;  . PATCH ANGIOPLASTY Right 01/18/2015   Procedure: BASILIC VEIN PATCH ANGIOPLASTY USING VASCUGUARD PATCH;  Surgeon: Angelia Mould, MD;  Location: [place];  Service: Vascular;  Laterality: Right;  . PERIPHERAL VASCULAR CATHETERIZATION N/A 06/23/2016   Procedure: Abdominal Aortogram w/Lower Extremity;  Surgeon: Serafina Mitchell, MD;  Location: [place] CV LAB;  Service: Cardiovascular;  Laterality: N/A;  . PERIPHERAL VASCULAR CATHETERIZATION  06/23/2016   Procedure: Peripheral Vascular Intervention;  Surgeon: Serafina Mitchell, MD;  Location: [place] CV LAB;  Service: Cardiovascular;;  lt common and external illiac artery     Social History   Social History  . Marital status: Married    Spouse name: [name]  . Number of children: 4  . Years of education: some collg   Occupational History  . Retired    Social History Main Topics  . Smoking status: Former Smoker    Packs/day: 0.35    Years: 40.00    Types: Cigarettes    Quit date: 05/10/2012  . Smokeless tobacco: Never Used  . Alcohol use No  . Drug use:      Comment: marijuana; quit in early 1980's  . Sexual activity: Yes   Other Topics Concern  . Not on file   Social History Narrative   Patient lives at home with spouse.   Caffeine Use: 1 cup daily   Current Outpatient Prescriptions on File Prior to Visit  Medication Sig Dispense Refill  . albuterol (PROVENTIL HFA;VENTOLIN HFA) 108 (90 BASE) MCG/ACT inhaler Inhale 1-2 puffs into the lungs every 6 (six) hours as needed for wheezing or shortness of breath.    . ALPRAZolam (XANAX) 0.25 MG tablet Take 0.25 mg by mouth daily.    Marland Kitchen amLODipine (NORVASC) 10 MG tablet Take 10 mg by mouth at bedtime.     Marland Kitchen aspirin 81 MG chewable tablet Chew 81 mg by mouth every evening.     Marland Kitchen atorvastatin (LIPITOR) 20 MG tablet Take 20 mg by mouth at bedtime.     . budesonide-formoterol  (SYMBICORT) 160-4.5 MCG/ACT inhaler Inhale 1 puff into the lungs every evening.    . carvedilol (COREG) 25 MG tablet Take 25 mg by mouth. Take BID on Tue-Thu-Sat-Sun    . carvedilol (COREG) 25 MG tablet Take 25 mg by mouth. Take at 5PM on dialysis days of M-W-F    . cinacalcet (SENSIPAR) 30 MG tablet Take 30 mg by mouth every evening.     . clopidogrel (PLAVIX) 75 MG tablet Take 1 tablet (75 mg total) by mouth every evening.    . Ferric Citrate (AURYXIA) 1 GM 210 MG(Fe) TABS Take 630 mg by mouth 3 (three) times daily with meals.    . fluticasone (FLONASE) 50 MCG/ACT nasal spray Place 1 spray into both nostrils daily as needed for allergies or rhinitis.    Marland Kitchen insulin lispro (HUMALOG)  100 UNIT/ML injection Inject 0-8 Units into the skin 3 (three) times daily before meals. 0-59 = hypoglycemic protocol, 60-149 = 0 units, 150-250 = 5 units, 251-300 = 8 units, >301 notify MD/NP    . isosorbide-hydrALAZINE (BIDIL) 20-37.5 MG tablet Take 1 tablet by mouth 2 (two) times daily. 60 tablet 0  . LEVEMIR FLEXTOUCH 100 UNIT/ML Pen Inject 10 Units into the skin at bedtime.     Marland Kitchen loratadine (CLARITIN) 10 MG tablet Take 10 mg by mouth daily as needed for allergies.    . multivitamin (RENA-VIT) TABS tablet Take 1 tablet by mouth every evening.   5  . omeprazole (PRILOSEC) 20 MG capsule Take 20 mg by mouth daily.     . ondansetron (ZOFRAN) 4 MG tablet Take 4 mg by mouth every 6 (six) hours as needed for nausea or vomiting. Offer 1 hour prior to meals    . Vitamin D, Ergocalciferol, (DRISDOL) 50000 units CAPS capsule Take 50,000 Units by mouth every 7 (seven) days. Mondays  3  . bisacodyl (DULCOLAX) 10 MG suppository Place 10 mg rectally every three (3) days as needed for moderate constipation (If no BM).    Marland Kitchen docusate sodium (COLACE) 100 MG capsule Take 100 mg by mouth 2 (two) times daily.    . mupirocin ointment (BACTROBAN) 2 % Apply 1 application topically 2 (two) times daily. (Patient not taking: Reported on  08/06/2016) 22 g 6  . Nutritional Supplements (FEEDING SUPPLEMENT, NEPRO CARB STEADY,) LIQD Take 237 mLs by mouth 2 (two) times daily between meals. (Patient not taking: Reported on 08/06/2016)  0  . oxyCODONE-acetaminophen (PERCOCET/ROXICET) 5-325 MG tablet Take 1 tablet by mouth every 8 (eight) hours as needed for moderate pain. (Patient not taking: Reported on 08/06/2016) 15 tablet 0  . pantoprazole (PROTONIX) 40 MG tablet Take 40 mg by mouth 2 (two) times daily.    . [DISCONTINUED] promethazine (PHENERGAN) 25 MG suppository Place 1 suppository (25 mg total) rectally every 6 (six) hours as needed for nausea or vomiting. (Patient not taking: Reported on 06/18/2015) 20 each 0   No current facility-administered medications on file prior to visit.      Physical Examination  Vitals:   08/06/16 1111 08/06/16 1115  BP: (!) 193/78 (!) 159/70  Pulse: 95 90  Resp: 16   Temp: 98 F (36.7 C)   SpO2: 100%   Weight: 139 lb (63 kg)   Height: 5\' 5"  (1.651 m)    Body mass index is 23.13 kg/m.  Left groin incision edges have separated to depth of fat tissue, incision edges are macerated, small abdominal panus with erythema in the fold, no drainage from macerated areas, but foul odor present at macerated open areas of left groin incision.  Left leg medial thigh and calf incisions are healing well.  Bilateral DP and PT pedal arteries with brisk Doppler signals. Left peroneal artery also with brisk Doppler signal.   Medical Decision Making  JACQUILINE ZURCHER is a 66 y.o. year old female who presents s/p left common and external iliac artery stenting with Dr. Trula Slade on 06/23/2016. She subsequently underwent left femoral to below-knee bypass by Dr. Donnetta Hutching. She had harvesting of her saphenous vein and proximal anastomosis with very poor flow through this vein. This was abandoned and she had left femoral to popliteal bypass prosthetic Gore-Tex graft placement on 07-08-16.  Pt states [place] will visit  today, check her blood pressure and change her left toes dressings.  Pt also states she  has not yet taken her blood pressure medications today.   Dr. Oneida Alar spoke with and examined pt.   Since she has artificial material and not her own vein as a bypass graft, will prescribe Keflex 500 mg po bid x 10 days, disp #20, 0 refills.  I advised pt to wash her left groin daily with soap and warm water, and to apply a sterile 4x4 gauze to be held in place by slightly snug, but not too snug, fitting underwear.   Follow up with Dr. Donnetta Hutching as shceduled on 08-15-16 with ABI and bypass duplex studies.  I discussed in depth with the patient the nature of atherosclerosis, and emphasized the importance of maximal medical management including strict control of blood pressure, blood glucose, and lipid levels, obtaining regular exercise, and cessation of smoking.  The patient is aware that without maximal medical management the underlying atherosclerotic disease process will progress, limiting the benefit of any interventions.    I emphasized the importance of routine surveillance of the patient's bypass, as the vascular surgery literature emphasize the improved patency possible with assisted primary patency procedures versus secondary patency procedures. The patient agrees to participate in their maximal medical care and routine surveillance.  Thank you for allowing [place] to participate in this patient's care.  NICKEL, Sharmon Leyden, RN, MSN, FNP-C [facility] Office: [phone]  08/06/2016, 11:20 AM  Clinic MD: Oneida Alar

## 2016-08-06 NOTE — Patient Instructions (Signed)

## 2016-08-07 DIAGNOSIS — N2581 Secondary hyperparathyroidism of renal origin: Secondary | ICD-10-CM | POA: Diagnosis not present

## 2016-08-07 DIAGNOSIS — D509 Iron deficiency anemia, unspecified: Secondary | ICD-10-CM | POA: Diagnosis not present

## 2016-08-07 DIAGNOSIS — D631 Anemia in chronic kidney disease: Secondary | ICD-10-CM | POA: Diagnosis not present

## 2016-08-07 DIAGNOSIS — E1122 Type 2 diabetes mellitus with diabetic chronic kidney disease: Secondary | ICD-10-CM | POA: Diagnosis not present

## 2016-08-07 DIAGNOSIS — N186 End stage renal disease: Secondary | ICD-10-CM | POA: Diagnosis not present

## 2016-08-10 DIAGNOSIS — D631 Anemia in chronic kidney disease: Secondary | ICD-10-CM | POA: Diagnosis not present

## 2016-08-10 DIAGNOSIS — D509 Iron deficiency anemia, unspecified: Secondary | ICD-10-CM | POA: Diagnosis not present

## 2016-08-10 DIAGNOSIS — N186 End stage renal disease: Secondary | ICD-10-CM | POA: Diagnosis not present

## 2016-08-10 DIAGNOSIS — E1122 Type 2 diabetes mellitus with diabetic chronic kidney disease: Secondary | ICD-10-CM | POA: Diagnosis not present

## 2016-08-10 DIAGNOSIS — N2581 Secondary hyperparathyroidism of renal origin: Secondary | ICD-10-CM | POA: Diagnosis not present

## 2016-08-11 DIAGNOSIS — I120 Hypertensive chronic kidney disease with stage 5 chronic kidney disease or end stage renal disease: Secondary | ICD-10-CM | POA: Diagnosis not present

## 2016-08-11 DIAGNOSIS — I70745 Atherosclerosis of other type of bypass graft(s) of the left leg with ulceration of other part of foot: Secondary | ICD-10-CM | POA: Diagnosis not present

## 2016-08-11 DIAGNOSIS — L97521 Non-pressure chronic ulcer of other part of left foot limited to breakdown of skin: Secondary | ICD-10-CM | POA: Diagnosis not present

## 2016-08-11 DIAGNOSIS — Z992 Dependence on renal dialysis: Secondary | ICD-10-CM | POA: Diagnosis not present

## 2016-08-11 DIAGNOSIS — N186 End stage renal disease: Secondary | ICD-10-CM | POA: Diagnosis not present

## 2016-08-11 DIAGNOSIS — E1122 Type 2 diabetes mellitus with diabetic chronic kidney disease: Secondary | ICD-10-CM | POA: Diagnosis not present

## 2016-08-12 DIAGNOSIS — E1122 Type 2 diabetes mellitus with diabetic chronic kidney disease: Secondary | ICD-10-CM | POA: Diagnosis not present

## 2016-08-12 DIAGNOSIS — N186 End stage renal disease: Secondary | ICD-10-CM | POA: Diagnosis not present

## 2016-08-12 DIAGNOSIS — D509 Iron deficiency anemia, unspecified: Secondary | ICD-10-CM | POA: Diagnosis not present

## 2016-08-12 DIAGNOSIS — N2581 Secondary hyperparathyroidism of renal origin: Secondary | ICD-10-CM | POA: Diagnosis not present

## 2016-08-12 DIAGNOSIS — D631 Anemia in chronic kidney disease: Secondary | ICD-10-CM | POA: Diagnosis not present

## 2016-08-13 ENCOUNTER — Ambulatory Visit (INDEPENDENT_AMBULATORY_CARE_PROVIDER_SITE_OTHER): Payer: Medicare Other | Admitting: Orthopedic Surgery

## 2016-08-13 ENCOUNTER — Encounter: Payer: Self-pay | Admitting: Vascular Surgery

## 2016-08-14 DIAGNOSIS — N186 End stage renal disease: Secondary | ICD-10-CM | POA: Diagnosis not present

## 2016-08-14 DIAGNOSIS — D509 Iron deficiency anemia, unspecified: Secondary | ICD-10-CM | POA: Diagnosis not present

## 2016-08-14 DIAGNOSIS — N2581 Secondary hyperparathyroidism of renal origin: Secondary | ICD-10-CM | POA: Diagnosis not present

## 2016-08-14 DIAGNOSIS — E1122 Type 2 diabetes mellitus with diabetic chronic kidney disease: Secondary | ICD-10-CM | POA: Diagnosis not present

## 2016-08-14 DIAGNOSIS — D631 Anemia in chronic kidney disease: Secondary | ICD-10-CM | POA: Diagnosis not present

## 2016-08-15 ENCOUNTER — Encounter (INDEPENDENT_AMBULATORY_CARE_PROVIDER_SITE_OTHER): Payer: Self-pay | Admitting: Orthopedic Surgery

## 2016-08-15 ENCOUNTER — Ambulatory Visit (INDEPENDENT_AMBULATORY_CARE_PROVIDER_SITE_OTHER): Payer: Medicare Other | Admitting: Orthopedic Surgery

## 2016-08-15 DIAGNOSIS — I70262 Atherosclerosis of native arteries of extremities with gangrene, left leg: Secondary | ICD-10-CM

## 2016-08-15 DIAGNOSIS — I96 Gangrene, not elsewhere classified: Secondary | ICD-10-CM

## 2016-08-15 DIAGNOSIS — B351 Tinea unguium: Secondary | ICD-10-CM | POA: Diagnosis not present

## 2016-08-15 NOTE — Progress Notes (Signed)
Office Visit Note   Patient: Destiny Day           Date of Birth: [birth year]           MRN: [id number] Visit Date: 08/15/2016              Requested by: Velna Hatchet, MD [place], [place] [address] PCP: Velna Hatchet, MD  Chief Complaint  Patient presents with  . 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 Patient also complains of the skin discolored onychomycotic nails on the right foot. HPI  Assessment & Plan: Visit Diagnoses:  1. Gangrene of toe of left foot ([place])   2. Onychomycosis     Plan: Nails trimmed on the right foot 5 without complications. Patient would like to proceed with a transmetatarsal amputation the left. She would like to proceed on a Tuesday so it does not conflict with dialysis Monday Wednesday Friday. We will set her up for surgery either on January 30 or February 6. Risks and benefits were discussed including risk of the wound not healing and need for a transtibial amputation. Discussed the revascularization with Dr. early should help the healing regardless of what surgery is ultimately required.  Follow-Up Instructions: Return if symptoms worsen or fail to improve.   Ortho Exam Examination patient is alert oriented nonoperatively well-dressed will affect normal respiratory effort currently ambulating a wheelchair. Examination she has dry cracked skin on both feet. She has thick and discolored onychomycotic nails on the right which she is unable to safely trim on her own due to her diabetic insensate neuropathy. Left foot has dry gangrenous changes to the great toe and fourth toe with ischemic changes to the other lesser toes. She has no ascending cellulitis no abscess no open ulcer no drainage no signs of infection.  Imaging: No results found.  Orders:  No orders of the defined types were  placed in this encounter.  No orders of the defined types were placed in this encounter.    Procedures: No procedures performed  Clinical Data: No additional findings.  Subjective: Review of Systems  Objective: Vital Signs: There were no vitals taken for this visit.  Specialty Comments:  No specialty comments available.  PMFS History: Patient Active Problem List   Diagnosis Date Noted  . Onychomycosis 08/15/2016  . Gangrene of toe of left foot ([place])   . PAD (peripheral artery disease) ([place]) 07/08/2016  . Atherosclerosis of native artery of left lower extremity with gangrene ([place]) 06/23/2016  . GERD (gastroesophageal reflux disease) 10/07/2015  . ARF (acute renal failure) ([place])   . Hypothermia 06/12/2015  . Acute on chronic renal failure ([place]) 06/12/2015  . CHF (congestive heart failure) ([place]) 07/30/2014  . CKD (chronic kidney disease) stage 4, GFR 15-29 ml/min (HCC) 06/27/2014  . Hypertensive heart disease 05/25/2013  . CKD (chronic kidney disease) stage 3, GFR 30-59 ml/min 05/25/2013  . Pulmonary edema 05/23/2013  . Type 2 diabetes mellitus with diabetic nephropathy ([place]) 05/23/2013  . Type 2 diabetes mellitus with hyperosmolar nonketotic hyperglycemia ([place]) 04/13/2013  . Chronic diastolic CHF (congestive heart failure) ([place]) 04/13/2013  . UGI bleed 03/31/2013  . Hypokalemia 08/24/2012  . Type II or unspecified type diabetes mellitus without mention of complication, not stated as uncontrolled 12/27/2007  . HLD (hyperlipidemia) 12/27/2007  . ALLERGIC RHINITIS 12/27/2007  . Asthma 12/27/2007  Past Medical History:  Diagnosis Date  . Abdominal bruit   . Anemia   . Anxiety   . Arthritis    Osteoarthritis  . Asthma   . Cervical disc disease    "pinced nerve"  . CHF (congestive heart failure) ([place])   . Complication of anesthesia    " after I got home from my last procedure, I started itching."  . Diabetes mellitus    insulin dependent  . Diverticulitis   .  ESRD on peritoneal dialysis [facility])    Hemodialysis - MWF  . GERD (gastroesophageal reflux disease)    from medications  . GI bleed 03/31/2013  . History of hiatal hernia   . Hyperlipidemia   . Hypertension   . Osteoporosis   . Pneumonia   . Seasonal allergies   . Shortness of breath dyspnea    WIth exertion  . Sleep apnea    can't afford cpap    Family History  Problem Relation Age of Onset  . Other Mother     not sure of cause of death  . Diabetes Father   . Pancreatic cancer Maternal Grandmother   . Colon cancer Neg Hx     Past Surgical History:  Procedure Laterality Date  . ABDOMINAL HYSTERECTOMY  1993`  . AV FISTULA PLACEMENT Left 04/21/2016   Procedure: INSERTION OF ARTERIOVENOUS (AV) GORE-TEX GRAFT ARM LEFT;  Surgeon: Elam Dutch, MD;  Location: [place];  Service: Vascular;  Laterality: Left;  . [place] TRANSPOSITION Left 07/10/2014   Procedure: BASCILIC VEIN TRANSPOSITION;  Surgeon: Angelia Mould, MD;  Location: [place];  Service: Vascular;  Laterality: Left;  . BASCILIC VEIN TRANSPOSITION Right 11/08/2014   Procedure: FIRST STAGE BASILIC VEIN TRANSPOSITION;  Surgeon: Angelia Mould, MD;  Location: [place];  Service: Vascular;  Laterality: Right;  . BASCILIC VEIN TRANSPOSITION Right 01/18/2015   Procedure: SECOND STAGE BASILIC VEIN TRANSPOSITION;  Surgeon: Angelia Mould, MD;  Location: [facility] OR;  Service: Vascular;  Laterality: Right;  . ESOPHAGOGASTRODUODENOSCOPY N/A 03/31/2013   Procedure: ESOPHAGOGASTRODUODENOSCOPY (EGD);  Surgeon: Gatha Mayer, MD;  Location: [facility] ENDOSCOPY;  Service: Endoscopy;  Laterality: N/A;  . EYE SURGERY     laser surgery  . FEMORAL-POPLITEAL BYPASS GRAFT Left 07/08/2016   Procedure: LEFT  FEMORAL-BELOW KNEE POPLITEAL ARTERY BYPASS GRAFT USING 6MM X 80 CM PROPATEN GORETEX GRAFT WITH RINGS.;  Surgeon: Rosetta Posner, MD;  Location: [place];  Service: Vascular;  Laterality: Left;  . FISTULOGRAM Left 10/29/2014   Procedure:  FISTULOGRAM;  Surgeon: Angelia Mould, MD;  Location: [facility] CATH LAB;  Service: Cardiovascular;  Laterality: Left;  . LIGATION OF ARTERIOVENOUS  FISTULA Left 04/21/2016   Procedure: LIGATION OF ARTERIOVENOUS  FISTULA LEFT ARM;  Surgeon: Elam Dutch, MD;  Location: [place];  Service: Vascular;  Laterality: Left;  . PATCH ANGIOPLASTY Right 01/18/2015   Procedure: BASILIC VEIN PATCH ANGIOPLASTY USING VASCUGUARD PATCH;  Surgeon: Angelia Mould, MD;  Location: [place];  Service: Vascular;  Laterality: Right;  . PERIPHERAL VASCULAR CATHETERIZATION N/A 06/23/2016   Procedure: Abdominal Aortogram w/Lower Extremity;  Surgeon: Serafina Mitchell, MD;  Location: [place] CV LAB;  Service: Cardiovascular;  Laterality: N/A;  . PERIPHERAL VASCULAR CATHETERIZATION  06/23/2016   Procedure: Peripheral Vascular Intervention;  Surgeon: Serafina Mitchell, MD;  Location: [place] CV LAB;  Service: Cardiovascular;;  lt common and external illiac artery   Social History   Occupational History  . Retired  Social History Main Topics  . Smoking status: Former Smoker    Packs/day: 0.35    Years: 40.00    Types: Cigarettes    Quit date: 05/10/2012  . Smokeless tobacco: Never Used  . Alcohol use No  . Drug use: Yes     Comment: marijuana; quit in early 1980's  . Sexual activity: Yes

## 2016-08-17 DIAGNOSIS — D631 Anemia in chronic kidney disease: Secondary | ICD-10-CM | POA: Diagnosis not present

## 2016-08-17 DIAGNOSIS — D509 Iron deficiency anemia, unspecified: Secondary | ICD-10-CM | POA: Diagnosis not present

## 2016-08-17 DIAGNOSIS — N186 End stage renal disease: Secondary | ICD-10-CM | POA: Diagnosis not present

## 2016-08-17 DIAGNOSIS — E1122 Type 2 diabetes mellitus with diabetic chronic kidney disease: Secondary | ICD-10-CM | POA: Diagnosis not present

## 2016-08-17 DIAGNOSIS — I70745 Atherosclerosis of other type of bypass graft(s) of the left leg with ulceration of other part of foot: Secondary | ICD-10-CM | POA: Diagnosis not present

## 2016-08-17 DIAGNOSIS — N2581 Secondary hyperparathyroidism of renal origin: Secondary | ICD-10-CM | POA: Diagnosis not present

## 2016-08-17 DIAGNOSIS — I120 Hypertensive chronic kidney disease with stage 5 chronic kidney disease or end stage renal disease: Secondary | ICD-10-CM | POA: Diagnosis not present

## 2016-08-17 DIAGNOSIS — Z992 Dependence on renal dialysis: Secondary | ICD-10-CM | POA: Diagnosis not present

## 2016-08-17 DIAGNOSIS — L97521 Non-pressure chronic ulcer of other part of left foot limited to breakdown of skin: Secondary | ICD-10-CM | POA: Diagnosis not present

## 2016-08-18 ENCOUNTER — Encounter: Payer: Self-pay | Admitting: Vascular Surgery

## 2016-08-18 ENCOUNTER — Ambulatory Visit (INDEPENDENT_AMBULATORY_CARE_PROVIDER_SITE_OTHER): Payer: Medicare Other | Admitting: Vascular Surgery

## 2016-08-18 VITALS — BP 94/55 | HR 97 | Temp 98.3°F | Resp 18 | Ht 65.0 in | Wt 143.8 lb

## 2016-08-18 DIAGNOSIS — I739 Peripheral vascular disease, unspecified: Secondary | ICD-10-CM

## 2016-08-18 NOTE — Progress Notes (Signed)
Patient name: Destiny Day MRN: [id number] DOB: [birth year] Sex: female  REASON FOR VISIT: Follow-up of left femoral-popliteal bypass  HPI: Destiny Day is a 66 y.o. female here today for follow-up. She has had some wound issues in her left groin and is here for follow-up of this. She is seen Dr. Sharol Given with plan on partial foot amputation on the next 1-2 weeks  Current Outpatient Prescriptions  Medication Sig Dispense Refill  . albuterol (PROVENTIL HFA;VENTOLIN HFA) 108 (90 BASE) MCG/ACT inhaler Inhale 1-2 puffs into the lungs every 6 (six) hours as needed for wheezing or shortness of breath.    . ALPRAZolam (XANAX) 0.25 MG tablet Take 0.25 mg by mouth daily.    Marland Kitchen amLODipine (NORVASC) 10 MG tablet Take 10 mg by mouth at bedtime.     Marland Kitchen aspirin 81 MG chewable tablet Chew 81 mg by mouth every evening.     Marland Kitchen atorvastatin (LIPITOR) 20 MG tablet Take 20 mg by mouth at bedtime.     Marland Kitchen azithromycin (ZITHROMAX) 500 MG tablet     . bisacodyl (DULCOLAX) 10 MG suppository Place 10 mg rectally every three (3) days as needed for moderate constipation (If no BM).    . budesonide-formoterol (SYMBICORT) 160-4.5 MCG/ACT inhaler Inhale 1 puff into the lungs every evening.    . carvedilol (COREG) 12.5 MG tablet     . carvedilol (COREG) 25 MG tablet Take 25 mg by mouth. Take BID on Tue-Thu-Sat-Sun    . carvedilol (COREG) 25 MG tablet Take 25 mg by mouth. Take at 5PM on dialysis days of M-W-F    . cephALEXin (KEFLEX) 500 MG capsule Take 1 capsule (500 mg total) by mouth 2 (two) times daily. 20 capsule 0  . cinacalcet (SENSIPAR) 30 MG tablet Take 30 mg by mouth every evening.     . clopidogrel (PLAVIX) 75 MG tablet Take 1 tablet (75 mg total) by mouth every evening.    . docusate sodium (COLACE) 100 MG capsule Take 100 mg by mouth 2 (two) times daily.    . Ferric Citrate (AURYXIA) 1 GM 210 MG(Fe) TABS Take 630 mg by mouth 3 (three) times daily with meals.    .  fluticasone (FLONASE) 50 MCG/ACT nasal spray Place 1 spray into both nostrils daily as needed for allergies or rhinitis.    Marland Kitchen insulin lispro (HUMALOG) 100 UNIT/ML injection Inject 0-8 Units into the skin 3 (three) times daily before meals. 0-59 = hypoglycemic protocol, 60-149 = 0 units, 150-250 = 5 units, 251-300 = 8 units, >301 notify MD/NP    . isosorbide-hydrALAZINE (BIDIL) 20-37.5 MG tablet Take 1 tablet by mouth 2 (two) times daily. 60 tablet 0  . LEVEMIR FLEXTOUCH 100 UNIT/ML Pen Inject 10 Units into the skin at bedtime.     Marland Kitchen loratadine (CLARITIN) 10 MG tablet Take 10 mg by mouth daily as needed for allergies.    . multivitamin (RENA-VIT) TABS tablet Take 1 tablet by mouth every evening.   5  . mupirocin ointment (BACTROBAN) 2 % Apply 1 application topically 2 (two) times daily. 22 g 6  . Nutritional Supplements (FEEDING SUPPLEMENT, NEPRO CARB STEADY,) LIQD Take 237 mLs by mouth 2 (two) times daily between meals.  0  . omeprazole (PRILOSEC) 20 MG capsule Take 20 mg by mouth daily.     . ondansetron (ZOFRAN) 4 MG tablet Take 4 mg by mouth every 6 (six) hours as needed for nausea or vomiting. Offer 1 hour prior to meals    .  oxyCODONE-acetaminophen (PERCOCET/ROXICET) 5-325 MG tablet Take 1 tablet by mouth every 8 (eight) hours as needed for moderate pain. 15 tablet 0  . pantoprazole (PROTONIX) 40 MG tablet Take 40 mg by mouth 2 (two) times daily.    . Vitamin D, Ergocalciferol, (DRISDOL) 50000 units CAPS capsule Take 50,000 Units by mouth every 7 (seven) days. Mondays  3   No current facility-administered medications for this visit.      PHYSICAL EXAM: Vitals:   08/18/16 1436  BP: (!) 94/55  Pulse: 97  Resp: 18  Temp: 98.3 F (36.8 C)  TempSrc: Oral  SpO2: 98%  Weight: 143 lb 12.8 oz (65.2 kg)  Height: 5\' 5"  (1.651 m)    GENERAL: The patient is a well-nourished female, in no acute distress. The vital signs are documented above. 2+ posterior tibial pulse. Her left groin incision  reveals a very superficial opening at the upper and lower portion of the incision. She also has 3 small openings more medial in the area above her incision on pannus. None of these are fluctuant and have minimal drainage.  MEDICAL ISSUES: Reassured the patient. She has excellent flow to her foot should have adequate flow for healing of her partial foot amputation. Instructed her on local wound care of her groin incision. Unclear as to the cause of the small superficial ulcerations away from the incision itself. This may be related to tape being placed at this area. She will Redmond Baseman and dry this area and keep a gauze over this if it is draining. I will see her again in one month for continued follow-up   Rosetta Posner, MD [facility] Tel [phone] Pager [phone]

## 2016-08-19 DIAGNOSIS — D509 Iron deficiency anemia, unspecified: Secondary | ICD-10-CM | POA: Diagnosis not present

## 2016-08-19 DIAGNOSIS — E1122 Type 2 diabetes mellitus with diabetic chronic kidney disease: Secondary | ICD-10-CM | POA: Diagnosis not present

## 2016-08-19 DIAGNOSIS — N2581 Secondary hyperparathyroidism of renal origin: Secondary | ICD-10-CM | POA: Diagnosis not present

## 2016-08-19 DIAGNOSIS — D631 Anemia in chronic kidney disease: Secondary | ICD-10-CM | POA: Diagnosis not present

## 2016-08-19 DIAGNOSIS — N186 End stage renal disease: Secondary | ICD-10-CM | POA: Diagnosis not present

## 2016-08-20 DIAGNOSIS — Z992 Dependence on renal dialysis: Secondary | ICD-10-CM | POA: Diagnosis not present

## 2016-08-20 DIAGNOSIS — N186 End stage renal disease: Secondary | ICD-10-CM | POA: Diagnosis not present

## 2016-08-20 DIAGNOSIS — I120 Hypertensive chronic kidney disease with stage 5 chronic kidney disease or end stage renal disease: Secondary | ICD-10-CM | POA: Diagnosis not present

## 2016-08-20 DIAGNOSIS — I70745 Atherosclerosis of other type of bypass graft(s) of the left leg with ulceration of other part of foot: Secondary | ICD-10-CM | POA: Diagnosis not present

## 2016-08-20 DIAGNOSIS — E1122 Type 2 diabetes mellitus with diabetic chronic kidney disease: Secondary | ICD-10-CM | POA: Diagnosis not present

## 2016-08-20 DIAGNOSIS — L97521 Non-pressure chronic ulcer of other part of left foot limited to breakdown of skin: Secondary | ICD-10-CM | POA: Diagnosis not present

## 2016-08-21 ENCOUNTER — Telehealth: Payer: Self-pay

## 2016-08-21 DIAGNOSIS — N186 End stage renal disease: Secondary | ICD-10-CM | POA: Diagnosis not present

## 2016-08-21 DIAGNOSIS — E1122 Type 2 diabetes mellitus with diabetic chronic kidney disease: Secondary | ICD-10-CM | POA: Diagnosis not present

## 2016-08-21 DIAGNOSIS — D509 Iron deficiency anemia, unspecified: Secondary | ICD-10-CM | POA: Diagnosis not present

## 2016-08-21 DIAGNOSIS — N2581 Secondary hyperparathyroidism of renal origin: Secondary | ICD-10-CM | POA: Diagnosis not present

## 2016-08-21 DIAGNOSIS — D631 Anemia in chronic kidney disease: Secondary | ICD-10-CM | POA: Diagnosis not present

## 2016-08-21 NOTE — Telephone Encounter (Signed)
Rec'd phone call from Physical Therapist with [facility].  Reported he would like to continue seeing pt. once/ wk. x 4 weeks for her Therapy.  Advised that Dr. Donnetta Hutching would be in agreement with this.  Next f/u with Dr. Donnetta Hutching 09/15/16.

## 2016-08-24 ENCOUNTER — Other Ambulatory Visit (INDEPENDENT_AMBULATORY_CARE_PROVIDER_SITE_OTHER): Payer: Self-pay | Admitting: Family

## 2016-08-24 DIAGNOSIS — N186 End stage renal disease: Secondary | ICD-10-CM | POA: Diagnosis not present

## 2016-08-24 DIAGNOSIS — N2581 Secondary hyperparathyroidism of renal origin: Secondary | ICD-10-CM | POA: Diagnosis not present

## 2016-08-24 DIAGNOSIS — D631 Anemia in chronic kidney disease: Secondary | ICD-10-CM | POA: Diagnosis not present

## 2016-08-24 DIAGNOSIS — E1122 Type 2 diabetes mellitus with diabetic chronic kidney disease: Secondary | ICD-10-CM | POA: Diagnosis not present

## 2016-08-24 DIAGNOSIS — D509 Iron deficiency anemia, unspecified: Secondary | ICD-10-CM | POA: Diagnosis not present

## 2016-08-25 DIAGNOSIS — I120 Hypertensive chronic kidney disease with stage 5 chronic kidney disease or end stage renal disease: Secondary | ICD-10-CM | POA: Diagnosis not present

## 2016-08-25 DIAGNOSIS — E1122 Type 2 diabetes mellitus with diabetic chronic kidney disease: Secondary | ICD-10-CM | POA: Diagnosis not present

## 2016-08-25 DIAGNOSIS — N186 End stage renal disease: Secondary | ICD-10-CM | POA: Diagnosis not present

## 2016-08-25 DIAGNOSIS — Z992 Dependence on renal dialysis: Secondary | ICD-10-CM | POA: Diagnosis not present

## 2016-08-25 DIAGNOSIS — L97521 Non-pressure chronic ulcer of other part of left foot limited to breakdown of skin: Secondary | ICD-10-CM | POA: Diagnosis not present

## 2016-08-25 DIAGNOSIS — I70745 Atherosclerosis of other type of bypass graft(s) of the left leg with ulceration of other part of foot: Secondary | ICD-10-CM | POA: Diagnosis not present

## 2016-08-26 DIAGNOSIS — D509 Iron deficiency anemia, unspecified: Secondary | ICD-10-CM | POA: Diagnosis not present

## 2016-08-26 DIAGNOSIS — N2581 Secondary hyperparathyroidism of renal origin: Secondary | ICD-10-CM | POA: Diagnosis not present

## 2016-08-26 DIAGNOSIS — D631 Anemia in chronic kidney disease: Secondary | ICD-10-CM | POA: Diagnosis not present

## 2016-08-26 DIAGNOSIS — I159 Secondary hypertension, unspecified: Secondary | ICD-10-CM | POA: Diagnosis not present

## 2016-08-26 DIAGNOSIS — N186 End stage renal disease: Secondary | ICD-10-CM | POA: Diagnosis not present

## 2016-08-26 DIAGNOSIS — E1122 Type 2 diabetes mellitus with diabetic chronic kidney disease: Secondary | ICD-10-CM | POA: Diagnosis not present

## 2016-08-26 DIAGNOSIS — Z992 Dependence on renal dialysis: Secondary | ICD-10-CM | POA: Diagnosis not present

## 2016-08-27 ENCOUNTER — Ambulatory Visit (INDEPENDENT_AMBULATORY_CARE_PROVIDER_SITE_OTHER): Payer: Medicare Other | Admitting: Orthopedic Surgery

## 2016-08-27 DIAGNOSIS — N186 End stage renal disease: Secondary | ICD-10-CM | POA: Diagnosis not present

## 2016-08-27 DIAGNOSIS — I120 Hypertensive chronic kidney disease with stage 5 chronic kidney disease or end stage renal disease: Secondary | ICD-10-CM | POA: Diagnosis not present

## 2016-08-27 DIAGNOSIS — L97521 Non-pressure chronic ulcer of other part of left foot limited to breakdown of skin: Secondary | ICD-10-CM | POA: Diagnosis not present

## 2016-08-27 DIAGNOSIS — Z992 Dependence on renal dialysis: Secondary | ICD-10-CM | POA: Diagnosis not present

## 2016-08-27 DIAGNOSIS — E1122 Type 2 diabetes mellitus with diabetic chronic kidney disease: Secondary | ICD-10-CM | POA: Diagnosis not present

## 2016-08-27 DIAGNOSIS — I70745 Atherosclerosis of other type of bypass graft(s) of the left leg with ulceration of other part of foot: Secondary | ICD-10-CM | POA: Diagnosis not present

## 2016-08-28 DIAGNOSIS — N186 End stage renal disease: Secondary | ICD-10-CM | POA: Diagnosis not present

## 2016-08-28 DIAGNOSIS — D631 Anemia in chronic kidney disease: Secondary | ICD-10-CM | POA: Diagnosis not present

## 2016-08-28 DIAGNOSIS — N2581 Secondary hyperparathyroidism of renal origin: Secondary | ICD-10-CM | POA: Diagnosis not present

## 2016-08-31 ENCOUNTER — Encounter (HOSPITAL_COMMUNITY): Payer: Self-pay | Admitting: *Deleted

## 2016-08-31 DIAGNOSIS — D631 Anemia in chronic kidney disease: Secondary | ICD-10-CM | POA: Diagnosis not present

## 2016-08-31 DIAGNOSIS — N186 End stage renal disease: Secondary | ICD-10-CM | POA: Diagnosis not present

## 2016-08-31 DIAGNOSIS — N2581 Secondary hyperparathyroidism of renal origin: Secondary | ICD-10-CM | POA: Diagnosis not present

## 2016-08-31 NOTE — Progress Notes (Signed)
Spoke with pt for pre-op call. Pt denies cardiac history, chest pain or sob. Pt is diabetic. Last A1C ws 7.5 on 06/24/16. Pt states her fasting blood sugar is usually around 150. Pt instructed to take 1/2 of her regular dose of Levemir tonight. Instructed pt to check her blood sugar in the AM when she gets up and every 2 hours prior to leaving for the hospital. If blood sugar is >220 take 1/2 of usual correction dose of Humalog insulin. If blood sugar is 70 or below, treat with 1/2 cup of clear juice (apple or cranberry) and recheck blood sugar 15 minutes after drinking juice. If blood sugar continues to be 70 or below, call the Short Stay department and ask to speak to a nurse. Pt voiced understanding.

## 2016-09-01 ENCOUNTER — Ambulatory Visit (HOSPITAL_COMMUNITY)
Admission: RE | Admit: 2016-09-01 | Discharge: 2016-09-01 | Disposition: A | Discharge status: Home / Self Care | Payer: Medicare Other | Source: Ambulatory Visit | Attending: Orthopedic Surgery | Admitting: Orthopedic Surgery

## 2016-09-01 ENCOUNTER — Inpatient Hospital Stay (HOSPITAL_COMMUNITY): Payer: Medicare Other | Admitting: Anesthesiology

## 2016-09-01 ENCOUNTER — Encounter (HOSPITAL_COMMUNITY)
Admission: RE | Disposition: A | Discharge status: Home / Self Care | Payer: Self-pay | Source: Ambulatory Visit | Attending: Orthopedic Surgery

## 2016-09-01 ENCOUNTER — Encounter (HOSPITAL_COMMUNITY): Payer: Self-pay | Admitting: Anesthesiology

## 2016-09-01 DIAGNOSIS — I96 Gangrene, not elsewhere classified: Secondary | ICD-10-CM

## 2016-09-01 DIAGNOSIS — E785 Hyperlipidemia, unspecified: Secondary | ICD-10-CM | POA: Insufficient documentation

## 2016-09-01 DIAGNOSIS — Z79899 Other long term (current) drug therapy: Secondary | ICD-10-CM | POA: Diagnosis not present

## 2016-09-01 DIAGNOSIS — Z87891 Personal history of nicotine dependence: Secondary | ICD-10-CM | POA: Insufficient documentation

## 2016-09-01 DIAGNOSIS — I132 Hypertensive heart and chronic kidney disease with heart failure and with stage 5 chronic kidney disease, or end stage renal disease: Secondary | ICD-10-CM | POA: Diagnosis not present

## 2016-09-01 DIAGNOSIS — J45909 Unspecified asthma, uncomplicated: Secondary | ICD-10-CM | POA: Insufficient documentation

## 2016-09-01 DIAGNOSIS — N186 End stage renal disease: Secondary | ICD-10-CM | POA: Insufficient documentation

## 2016-09-01 DIAGNOSIS — I509 Heart failure, unspecified: Secondary | ICD-10-CM | POA: Insufficient documentation

## 2016-09-01 DIAGNOSIS — I5032 Chronic diastolic (congestive) heart failure: Secondary | ICD-10-CM | POA: Diagnosis not present

## 2016-09-01 DIAGNOSIS — E1152 Type 2 diabetes mellitus with diabetic peripheral angiopathy with gangrene: Secondary | ICD-10-CM | POA: Insufficient documentation

## 2016-09-01 DIAGNOSIS — Z992 Dependence on renal dialysis: Secondary | ICD-10-CM | POA: Diagnosis not present

## 2016-09-01 DIAGNOSIS — E1122 Type 2 diabetes mellitus with diabetic chronic kidney disease: Secondary | ICD-10-CM | POA: Insufficient documentation

## 2016-09-01 DIAGNOSIS — Z7902 Long term (current) use of antithrombotics/antiplatelets: Secondary | ICD-10-CM | POA: Diagnosis not present

## 2016-09-01 DIAGNOSIS — E1140 Type 2 diabetes mellitus with diabetic neuropathy, unspecified: Secondary | ICD-10-CM | POA: Insufficient documentation

## 2016-09-01 DIAGNOSIS — I129 Hypertensive chronic kidney disease with stage 1 through stage 4 chronic kidney disease, or unspecified chronic kidney disease: Secondary | ICD-10-CM | POA: Diagnosis not present

## 2016-09-01 DIAGNOSIS — K219 Gastro-esophageal reflux disease without esophagitis: Secondary | ICD-10-CM | POA: Insufficient documentation

## 2016-09-01 DIAGNOSIS — N184 Chronic kidney disease, stage 4 (severe): Secondary | ICD-10-CM | POA: Diagnosis not present

## 2016-09-01 DIAGNOSIS — Z7982 Long term (current) use of aspirin: Secondary | ICD-10-CM | POA: Diagnosis not present

## 2016-09-01 DIAGNOSIS — Z7951 Long term (current) use of inhaled steroids: Secondary | ICD-10-CM | POA: Diagnosis not present

## 2016-09-01 DIAGNOSIS — Z794 Long term (current) use of insulin: Secondary | ICD-10-CM | POA: Diagnosis not present

## 2016-09-01 HISTORY — PX: AMPUTATION: SHX166

## 2016-09-01 LAB — GLUCOSE, CAPILLARY
Glucose-Capillary: 100 mg/dL — ABNORMAL HIGH (ref 65–99)
Glucose-Capillary: 98 mg/dL (ref 65–99)

## 2016-09-01 LAB — POCT I-STAT 4, (NA,K, GLUC, HGB,HCT)
Glucose, Bld: 109 mg/dL — ABNORMAL HIGH (ref 65–99)
HCT: 43 % (ref 36.0–46.0)
Hemoglobin: 14.6 g/dL (ref 12.0–15.0)
Potassium: 3.9 mmol/L (ref 3.5–5.1)
Sodium: 136 mmol/L (ref 135–145)

## 2016-09-01 SURGERY — AMPUTATION, FOOT, PARTIAL
Anesthesia: General | Site: Foot | Laterality: Left

## 2016-09-01 MED ORDER — OXYCODONE-ACETAMINOPHEN 5-325 MG PO TABS
1.0000 | ORAL_TABLET | Freq: Once | ORAL | Status: AC
  Administered 2016-09-01: 1 via ORAL

## 2016-09-01 MED ORDER — ONDANSETRON HCL 4 MG/2ML IJ SOLN
INTRAMUSCULAR | Status: AC
  Filled 2016-09-01: qty 2

## 2016-09-01 MED ORDER — MIDAZOLAM HCL 2 MG/2ML IJ SOLN
INTRAMUSCULAR | Status: AC
  Filled 2016-09-01: qty 2

## 2016-09-01 MED ORDER — PROMETHAZINE HCL 25 MG/ML IJ SOLN: 6.2500 mg | INTRAMUSCULAR | Status: DC | PRN

## 2016-09-01 MED ORDER — LIDOCAINE 2% (20 MG/ML) 5 ML SYRINGE
INTRAMUSCULAR | Status: AC
  Filled 2016-09-01: qty 5

## 2016-09-01 MED ORDER — HYDROMORPHONE HCL 1 MG/ML IJ SOLN
INTRAMUSCULAR | Status: AC
  Filled 2016-09-01: qty 1

## 2016-09-01 MED ORDER — MIDAZOLAM HCL 5 MG/5ML IJ SOLN
INTRAMUSCULAR | Status: DC | PRN
  Administered 2016-09-01: 1 mg via INTRAVENOUS

## 2016-09-01 MED ORDER — CEFAZOLIN SODIUM-DEXTROSE 2-4 GM/100ML-% IV SOLN
2.0000 g | INTRAVENOUS | Status: DC
  Filled 2016-09-01: qty 100

## 2016-09-01 MED ORDER — FENTANYL CITRATE (PF) 100 MCG/2ML IJ SOLN
INTRAMUSCULAR | Status: DC | PRN
  Administered 2016-09-01 (×2): 50 ug via INTRAVENOUS

## 2016-09-01 MED ORDER — CHLORHEXIDINE GLUCONATE 4 % EX LIQD: 60.0000 mL | Freq: Once | CUTANEOUS | Status: DC

## 2016-09-01 MED ORDER — ONDANSETRON HCL 4 MG/2ML IJ SOLN
INTRAMUSCULAR | Status: DC | PRN
  Administered 2016-09-01: 4 mg via INTRAVENOUS

## 2016-09-01 MED ORDER — PROPOFOL 10 MG/ML IV BOLUS
INTRAVENOUS | Status: DC | PRN
  Administered 2016-09-01: 130 mg via INTRAVENOUS

## 2016-09-01 MED ORDER — LIDOCAINE HCL (CARDIAC) 20 MG/ML IV SOLN
INTRAVENOUS | Status: DC | PRN
  Administered 2016-09-01: 60 mg via INTRAVENOUS

## 2016-09-01 MED ORDER — FENTANYL CITRATE (PF) 100 MCG/2ML IJ SOLN
INTRAMUSCULAR | Status: AC
  Filled 2016-09-01: qty 2

## 2016-09-01 MED ORDER — HYDROMORPHONE HCL 1 MG/ML IJ SOLN
0.2500 mg | INTRAMUSCULAR | Status: DC | PRN
  Administered 2016-09-01 (×2): 0.25 mg via INTRAVENOUS

## 2016-09-01 MED ORDER — SODIUM CHLORIDE 0.9 % IV SOLN
INTRAVENOUS | Status: DC
  Administered 2016-09-01: 25 mL/h via INTRAVENOUS

## 2016-09-01 MED ORDER — 0.9 % SODIUM CHLORIDE (POUR BTL) OPTIME
TOPICAL | Status: DC | PRN
  Administered 2016-09-01: 1000 mL

## 2016-09-01 MED ORDER — OXYCODONE-ACETAMINOPHEN 5-325 MG PO TABS
ORAL_TABLET | ORAL | Status: AC
  Filled 2016-09-01: qty 1

## 2016-09-01 MED ORDER — OXYCODONE-ACETAMINOPHEN 5-325 MG PO TABS: 1.0000 | ORAL_TABLET | ORAL | 0 refills | Status: DC | PRN

## 2016-09-01 MED ORDER — PROPOFOL 10 MG/ML IV BOLUS
INTRAVENOUS | Status: AC
  Filled 2016-09-01: qty 20

## 2016-09-01 SURGICAL SUPPLY — 37 items
BENZOIN TINCTURE PRP APPL 2/3 (GAUZE/BANDAGES/DRESSINGS) IMPLANT
BLADE SAW SGTL HD 18.5X60.5X1. (BLADE) ×2 IMPLANT
BLADE SURG 21 STRL SS (BLADE) ×2 IMPLANT
BNDG COHESIVE 4X5 TAN STRL (GAUZE/BANDAGES/DRESSINGS) ×2 IMPLANT
BNDG GAUZE ELAST 4 BULKY (GAUZE/BANDAGES/DRESSINGS) ×2 IMPLANT
COVER SURGICAL LIGHT HANDLE (MISCELLANEOUS) ×2 IMPLANT
DRAPE INCISE IOBAN 66X45 STRL (DRAPES) IMPLANT
DRAPE U-SHAPE 47X51 STRL (DRAPES) ×2 IMPLANT
DRSG ADAPTIC 3X8 NADH LF (GAUZE/BANDAGES/DRESSINGS) ×2 IMPLANT
DRSG PAD ABDOMINAL 8X10 ST (GAUZE/BANDAGES/DRESSINGS) ×4 IMPLANT
DURAPREP 26ML APPLICATOR (WOUND CARE) ×2 IMPLANT
ELECT REM PT RETURN 9FT ADLT (ELECTROSURGICAL) ×2
ELECTRODE REM PT RTRN 9FT ADLT (ELECTROSURGICAL) ×1 IMPLANT
GAUZE SPONGE 4X4 12PLY STRL (GAUZE/BANDAGES/DRESSINGS) ×2 IMPLANT
GLOVE BIO SURGEON STRL SZ7 (GLOVE) ×2 IMPLANT
GLOVE BIOGEL PI IND STRL 7.0 (GLOVE) ×1 IMPLANT
GLOVE BIOGEL PI IND STRL 9 (GLOVE) ×1 IMPLANT
GLOVE BIOGEL PI INDICATOR 7.0 (GLOVE) ×1
GLOVE BIOGEL PI INDICATOR 9 (GLOVE) ×1
GLOVE SURG ORTHO 9.0 STRL STRW (GLOVE) ×2 IMPLANT
GOWN STRL REUS W/ TWL LRG LVL3 (GOWN DISPOSABLE) ×1 IMPLANT
GOWN STRL REUS W/ TWL XL LVL3 (GOWN DISPOSABLE) ×1 IMPLANT
GOWN STRL REUS W/TWL LRG LVL3 (GOWN DISPOSABLE) ×1
GOWN STRL REUS W/TWL XL LVL3 (GOWN DISPOSABLE) ×1
KIT BASIN OR (CUSTOM PROCEDURE TRAY) ×2 IMPLANT
KIT ROOM TURNOVER OR (KITS) ×2 IMPLANT
NS IRRIG 1000ML POUR BTL (IV SOLUTION) ×2 IMPLANT
PACK ORTHO EXTREMITY (CUSTOM PROCEDURE TRAY) ×2 IMPLANT
PAD ARMBOARD 7.5X6 YLW CONV (MISCELLANEOUS) ×2 IMPLANT
SPONGE LAP 18X18 X RAY DECT (DISPOSABLE) IMPLANT
SUT ETHILON 2 0 PSLX (SUTURE) ×4 IMPLANT
SUT VIC AB 2-0 CTB1 (SUTURE) IMPLANT
TOWEL OR 17X24 6PK STRL BLUE (TOWEL DISPOSABLE) IMPLANT
TOWEL OR 17X26 10 PK STRL BLUE (TOWEL DISPOSABLE) ×2 IMPLANT
TUBE CONNECTING 12X1/4 (SUCTIONS) ×2 IMPLANT
WATER STERILE IRR 1000ML POUR (IV SOLUTION) IMPLANT
YANKAUER SUCT BULB TIP NO VENT (SUCTIONS) ×2 IMPLANT

## 2016-09-01 NOTE — Anesthesia Preprocedure Evaluation (Addendum)
Anesthesia Evaluation  Patient identified by MRN, date of birth, ID band Patient awake    Reviewed: Allergy & Precautions, NPO status , Patient's Chart, lab work & pertinent test results  History of Anesthesia Complications Negative for: history of anesthetic complications  Airway Mallampati: II  TM Distance: >3 FB Neck ROM: Full    Dental  (+) Edentulous Upper, Dental Advisory Given, Poor Dentition   Pulmonary asthma , sleep apnea , former smoker,    breath sounds clear to auscultation       Cardiovascular hypertension, + Peripheral Vascular Disease   Rhythm:Regular Rate:Normal  Study Conclusions  - Left ventricle: The cavity size was normal. There was severe   concentric hypertrophy. Systolic function was normal. The   estimated ejection fraction was in the range of 55% to 60%. Wall   motion was normal; there were no regional wall motion   abnormalities. Features are consistent with a pseudonormal left   ventricular filling pattern, with concomitant abnormal relaxation   and increased filling pressure (grade 2 diastolic dysfunction).   Doppler parameters are consistent with high ventricular filling   pressure. - Aortic valve: Mildly calcified annulus. Trileaflet; normal   thickness, mildly calcified leaflets. Transvalvular velocity was   within the normal range. There was no stenosis. There was no   regurgitation. - Mitral valve: Calcified annulus. Transvalvular velocity was   within the normal range. There was no evidence for stenosis.   There was no regurgitation. - Left atrium: The atrium was moderately dilated. - Right ventricle: The cavity size was normal. Wall thickness was   normal. Systolic function was normal. - Atrial septum: No defect or patent foramen ovale was identified. - Inferior vena cava: The vessel was normal in size. The   respirophasic diameter changes were in the normal range (>= 50%),   consistent  with normal central venous pressure.   Neuro/Psych Anxiety negative neurological ROS     GI/Hepatic Neg liver ROS, hiatal hernia, GERD  ,  Endo/Other  diabetes  Renal/GU Renal InsufficiencyRenal disease     Musculoskeletal   Abdominal   Peds  Hematology   Anesthesia Other Findings   Reproductive/Obstetrics                            Anesthesia Physical  Anesthesia Plan  ASA: III  Anesthesia Plan: General   Post-op Pain Management:    Induction: Intravenous  Airway Management Planned: LMA  Additional Equipment:   Intra-op Plan:   Post-operative Plan: Extubation in OR  Informed Consent: I have reviewed the patients History and Physical, chart, labs and discussed the procedure including the risks, benefits and alternatives for the proposed anesthesia with the patient or authorized representative who has indicated his/her understanding and acceptance.   Dental advisory given  Plan Discussed with: CRNA and Anesthesiologist  Anesthesia Plan Comments:         Anesthesia Quick Evaluation                                   Anesthesia Evaluation  Patient identified by MRN, date of birth, ID band Patient awake    Reviewed: Allergy & Precautions, NPO status , Patient's Chart, lab work & pertinent test results, reviewed documented beta blocker date and time   History of Anesthesia Complications Negative for: history of anesthetic complications  Airway Mallampati: II  TM Distance: >3 FB  Neck ROM: Full    Dental no notable dental hx. (+) Dental Advisory Given, Edentulous Upper, Partial Lower, Poor Dentition   Pulmonary asthma , sleep apnea , pneumonia -, former smoker,  breath sounds clear to auscultation  Pulmonary exam normal       Cardiovascular hypertension, Pt. on medications and Pt. on home beta blockers +CHF Normal cardiovascular examRhythm:Regular Rate:Normal  08/2014 - echo - - Left ventricle: The cavity  size was normal. There was moderate concentric hypertrophy. Systolic function was normal. The estimated ejection fraction was in the range of 50% to 55%. Wall motion was normal; there were no regional wall motion abnormalities. Doppler parameters are consistent with arestrictive pattern, indicative of decreased left ventricular diastolic compliance and/or increased left atrial pressure (grade 3 diastolic dysfunction). Doppler parameters are consistent withboth elevated ventricular end-diastolic filling pressure and elevated left atrial filling pressure. - Left atrium: The atrium was mildly dilated. - Atrial septum: No defect or patent foramen ovale was identified.   Neuro/Psych Anxiety    GI/Hepatic GERD-  Medicated,  Endo/Other  diabetes, Type 2, Insulin Dependent  Renal/GU ESRFRenal disease     Musculoskeletal  (+) Arthritis -,   Abdominal   Peds  Hematology  (+) anemia ,   Anesthesia Other Findings   Reproductive/Obstetrics                            Anesthesia Physical  Anesthesia Plan  ASA: III  Anesthesia Plan: General   Post-op Pain Management:    Induction: Intravenous  Airway Management Planned: LMA  Additional Equipment: None  Intra-op Plan:   Post-operative Plan: Extubation in OR  Informed Consent: I have reviewed the patients History and Physical, chart, labs and discussed the procedure including the risks, benefits and alternatives for the proposed anesthesia with the patient or authorized representative who has indicated his/her understanding and acceptance.   Dental advisory given  Plan Discussed with: CRNA  Anesthesia Plan Comments:         Anesthesia Quick Evaluation

## 2016-09-01 NOTE — Anesthesia Postprocedure Evaluation (Addendum)
Anesthesia Post Note  Patient: Destiny Day  Procedure(s) Performed: Procedure(s) (LRB): LEFT FOOT TRANSMETATARSAL AMPUTATION (Left)  Patient location during evaluation: PACU Anesthesia Type: General Level of consciousness: sedated Pain management: pain level controlled Vital Signs Assessment: post-procedure vital signs reviewed and stable Respiratory status: spontaneous breathing and respiratory function stable Cardiovascular status: stable Anesthetic complications: no       Last Vitals:  Vitals:   09/01/16 1203 09/01/16 1210  BP: 123/63 136/79  Pulse: 75 75  Resp: 18 18  Temp: 36.1 C     Last Pain:  Vitals:   09/01/16 1210  TempSrc:   PainSc: 2                  Shamiracle Gorden DANIEL

## 2016-09-01 NOTE — Transfer of Care (Signed)
Immediate Anesthesia Transfer of Care Note  Patient: Destiny Day  Procedure(s) Performed: Procedure(s): LEFT FOOT TRANSMETATARSAL AMPUTATION (Left)  Patient Location: PACU  Anesthesia Type:General  Level of Consciousness: awake, alert , oriented and patient cooperative  Airway & Oxygen Therapy: Patient Spontanous Breathing and Patient connected to nasal cannula oxygen  Post-op Assessment: Report given to RN and Post -op Vital signs reviewed and stable  Post vital signs: Reviewed and stable  Last Vitals:  Vitals:   09/01/16 0828 09/01/16 1119  BP: (!) 181/70   Pulse: 84   Resp: 18   Temp: 36.6 C 36.5 C    Last Pain:  Vitals:   09/01/16 1123  TempSrc:   PainSc: (P) 4          Complications: No apparent anesthesia complications

## 2016-09-01 NOTE — H&P (Signed)
Destiny Day is an 66 y.o. female.   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  Past Medical History:  Diagnosis Date  . Abdominal bruit   . Anemia   . Anxiety   . Arthritis    Osteoarthritis  . Asthma   . Cervical disc disease    "pinced nerve"  . CHF (congestive heart failure) ([place])   . Complication of anesthesia    " after I got home from my last procedure, I started itching."  . Diabetes mellitus    insulin dependent  . Diverticulitis   . ESRD on peritoneal dialysis [facility])    Hemodialysis - MWF  . GERD (gastroesophageal reflux disease)    from medications  . GI bleed 03/31/2013  . History of hiatal hernia   . Hyperlipidemia   . Hypertension   . Osteoporosis   . Pneumonia   . Seasonal allergies   . Shortness of breath dyspnea    WIth exertion  . Sleep apnea    can't afford cpap    Past Surgical History:  Procedure Laterality Date  . ABDOMINAL HYSTERECTOMY  1993`  . AV FISTULA PLACEMENT Left 04/21/2016   Procedure: INSERTION OF ARTERIOVENOUS (AV) GORE-TEX GRAFT ARM LEFT;  Surgeon: Elam Dutch, MD;  Location: [place];  Service: Vascular;  Laterality: Left;  . [place] TRANSPOSITION Left 07/10/2014   Procedure: BASCILIC VEIN TRANSPOSITION;  Surgeon: Angelia Mould, MD;  Location: [place];  Service: Vascular;  Laterality: Left;  . BASCILIC VEIN TRANSPOSITION Right 11/08/2014   Procedure: FIRST STAGE BASILIC VEIN TRANSPOSITION;  Surgeon: Angelia Mould, MD;  Location: [place];  Service: Vascular;  Laterality: Right;  . BASCILIC VEIN TRANSPOSITION Right 01/18/2015   Procedure: SECOND STAGE BASILIC VEIN TRANSPOSITION;  Surgeon: Angelia Mould, MD;  Location: [facility] OR;  Service: Vascular;  Laterality: Right;  . ESOPHAGOGASTRODUODENOSCOPY N/A 03/31/2013   Procedure: ESOPHAGOGASTRODUODENOSCOPY (EGD);  Surgeon: Gatha Mayer,  MD;  Location: [facility] ENDOSCOPY;  Service: Endoscopy;  Laterality: N/A;  . EYE SURGERY     laser surgery  . FEMORAL-POPLITEAL BYPASS GRAFT Left 07/08/2016   Procedure: LEFT  FEMORAL-BELOW KNEE POPLITEAL ARTERY BYPASS GRAFT USING 6MM X 80 CM PROPATEN GORETEX GRAFT WITH RINGS.;  Surgeon: Rosetta Posner, MD;  Location: [place];  Service: Vascular;  Laterality: Left;  . FISTULOGRAM Left 10/29/2014   Procedure: FISTULOGRAM;  Surgeon: Angelia Mould, MD;  Location: [facility] CATH LAB;  Service: Cardiovascular;  Laterality: Left;  . LIGATION OF ARTERIOVENOUS  FISTULA Left 04/21/2016   Procedure: LIGATION OF ARTERIOVENOUS  FISTULA LEFT ARM;  Surgeon: Elam Dutch, MD;  Location: [place];  Service: Vascular;  Laterality: Left;  . PATCH ANGIOPLASTY Right 01/18/2015   Procedure: BASILIC VEIN PATCH ANGIOPLASTY USING VASCUGUARD PATCH;  Surgeon: Angelia Mould, MD;  Location: [place];  Service: Vascular;  Laterality: Right;  . PERIPHERAL VASCULAR CATHETERIZATION N/A 06/23/2016   Procedure: Abdominal Aortogram w/Lower Extremity;  Surgeon: Serafina Mitchell, MD;  Location: [place] CV LAB;  Service: Cardiovascular;  Laterality: N/A;  . PERIPHERAL VASCULAR CATHETERIZATION  06/23/2016   Procedure: Peripheral Vascular Intervention;  Surgeon: Serafina Mitchell, MD;  Location: [place] CV LAB;  Service: Cardiovascular;;  lt common and external illiac artery    Family History  Problem Relation Age of Onset  . Other Mother     not sure of  cause of death  . Diabetes Father   . Pancreatic cancer Maternal Grandmother   . Colon cancer Neg Hx    Social History:  reports that she quit smoking about 4 years ago. Her smoking use included Cigarettes. She has a 14.00 pack-year smoking history. She has never used smokeless tobacco. She reports that she uses drugs. She reports that she does not drink alcohol.  Allergies:  Allergies  Allergen Reactions  . Lisinopril Cough  . Prednisone Other (See Comments)    Excessive  fluid buildup    Medications Prior to Admission  Medication Sig Dispense Refill  . albuterol (PROVENTIL HFA;VENTOLIN HFA) 108 (90 BASE) MCG/ACT inhaler Inhale 1-2 puffs into the lungs every 6 (six) hours as needed for wheezing or shortness of breath.    Marland Kitchen amLODipine (NORVASC) 10 MG tablet Take 10 mg by mouth at bedtime.     Marland Kitchen aspirin 81 MG chewable tablet Chew 81 mg by mouth every evening.     Marland Kitchen atorvastatin (LIPITOR) 20 MG tablet Take 20 mg by mouth at bedtime.     . budesonide-formoterol (SYMBICORT) 160-4.5 MCG/ACT inhaler Inhale 1 puff into the lungs every evening.    . carvedilol (COREG) 12.5 MG tablet Take 12.5 mg by mouth 2 (two) times daily.     . cinacalcet (SENSIPAR) 30 MG tablet Take 30 mg by mouth daily with supper.     . clopidogrel (PLAVIX) 75 MG tablet Take 1 tablet (75 mg total) by mouth every evening.    . Ferric Citrate (AURYXIA) 1 GM 210 MG(Fe) TABS Take 630 mg by mouth 3 (three) times daily with meals.    . fluticasone (FLONASE) 50 MCG/ACT nasal spray Place 1 spray into both nostrils daily as needed for allergies or rhinitis.    Marland Kitchen insulin lispro (HUMALOG) 100 UNIT/ML injection Inject 0-8 Units into the skin 3 (three) times daily before meals. 0-59 = hypoglycemic protocol, 60-149 = 0 units, 150-250 = 5 units, 251-300 = 8 units, >301 notify MD/NP    . isosorbide-hydrALAZINE (BIDIL) 20-37.5 MG tablet Take 1 tablet by mouth 2 (two) times daily. 60 tablet 0  . LEVEMIR FLEXTOUCH 100 UNIT/ML Pen Inject 10 Units into the skin at bedtime.     Marland Kitchen loratadine (CLARITIN) 10 MG tablet Take 10 mg by mouth daily as needed for allergies.    . multivitamin (RENA-VIT) TABS tablet Take 1 tablet by mouth every evening.   5  . omeprazole (PRILOSEC) 20 MG capsule Take 20 mg by mouth daily.     . Vitamin D, Ergocalciferol, (DRISDOL) 50000 units CAPS capsule Take 50,000 Units by mouth every 7 (seven) days. Mondays  3  . cephALEXin (KEFLEX) 500 MG capsule Take 1 capsule (500 mg total) by mouth 2 (two)  times daily. (Patient not taking: Reported on 08/27/2016) 20 capsule 0  . mupirocin ointment (BACTROBAN) 2 % Apply 1 application topically 2 (two) times daily. (Patient not taking: Reported on 08/27/2016) 22 g 6  . Nutritional Supplements (FEEDING SUPPLEMENT, NEPRO CARB STEADY,) LIQD Take 237 mLs by mouth 2 (two) times daily between meals. (Patient not taking: Reported on 08/27/2016)  0  . oxyCODONE-acetaminophen (PERCOCET/ROXICET) 5-325 MG tablet Take 1 tablet by mouth every 8 (eight) hours as needed for moderate pain. (Patient not taking: Reported on 08/27/2016) 15 tablet 0    Results for orders placed or performed during the hospital encounter of 09/01/16 (from the past 48 hour(s))  Glucose, capillary     Status: Abnormal   Collection  Time: 09/01/16  8:23 AM  Result Value Ref Range   Glucose-Capillary 100 (H) 65 - 99 mg/dL   Comment 1 Notify RN    Comment 2 Document in Chart   I-STAT 4, (NA,K, GLUC, HGB,HCT)     Status: Abnormal   Collection Time: 09/01/16  9:08 AM  Result Value Ref Range   Sodium 136 135 - 145 mmol/L   Potassium 3.9 3.5 - 5.1 mmol/L   Glucose, Bld 109 (H) 65 - 99 mg/dL   HCT 43.0 36.0 - 46.0 %   Hemoglobin 14.6 12.0 - 15.0 g/dL   No results found.  Review of Systems  All other systems reviewed and are negative.   Blood pressure (!) 181/70, pulse 84, temperature 97.9 F (36.6 C), temperature source Oral, resp. rate 18, height 5\' 5"  (1.651 m), weight 136 lb (61.7 kg), SpO2 100 %. Physical Exam  On examination patient is alert oriented no adenopathy well-dressed normal affect numerous [place]. She does not have a palpable pulse she has dry gangrene of the entire forefoot. There is no cellulitis no abscess no signs of infection. Assessment/Plan Assessment: Diabetic neuropathy with end-stage renal disease on peritoneal dialysis with dry gangrene of the left forefoot.  Plan: We'll plan for a transmetatarsal amputation. Risk and benefits were discussed including infection  neurovascular injury nonhealing in the incision. Discussed that with her microcirculatory disease she has an increased risk of the incision not healing and need for higher level amputation. Patient states she understands wish to proceed at this time.  Newt Minion, MD 09/01/2016, 10:37 AM

## 2016-09-01 NOTE — Op Note (Signed)
     Date of Surgery: 09/01/2016  INDICATIONS: Ms. Patman is a 66 y.o.-year-old female who has diabetic insensate neuropathy end-stage renal disease on peritoneal dialysis with gangrene of the left forefoot.Marland Kitchen  PREOPERATIVE DIAGNOSIS: Gangrene left forefoot  POSTOPERATIVE DIAGNOSIS: Same.  PROCEDURE: Transmetatarsal amputation   SURGEON: Sharol Given, M.D.  ANESTHESIA:  general  IV FLUIDS AND URINE: See anesthesia.  ESTIMATED BLOOD LOSS: Minimal mL.  COMPLICATIONS: None.  DESCRIPTION OF PROCEDURE: The patient was brought to the operating room and underwent a general anesthetic. After adequate levels of anesthesia were obtained patient's lower extremity was prepped using DuraPrep draped into a sterile field. A timeout was called.  A fishmouth incision was made just proximal to the ulcerative nonviable tissue. This was carried sharply down to bone. A oscillating saw was used to perform a transmetatarsal amputation with a gentle cascade of the metatarsals and beveled plantarly. Electrocautery was used for hemostasis. The wound was irrigated with normal saline. The incision was closed using 2-0 nylon. A sterile compressive dressing was applied. Patient was taken to the PACU in stable condition.  Meridee Score, MD [place] 11:11 AM

## 2016-09-01 NOTE — Progress Notes (Signed)
Pt has pre-existing LUE AVG, positive bruit/ thrill, 3+ left radial pulse

## 2016-09-01 NOTE — Progress Notes (Signed)
Orthopedic Tech Progress Note Patient Details:  Destiny Day 01/02/1951 [id number]  Ortho Devices Type of Ortho Device: Postop shoe/boot Ortho Device/Splint Interventions: Application   [name] 09/01/2016, 11:52 AM

## 2016-09-02 ENCOUNTER — Encounter (HOSPITAL_COMMUNITY): Payer: Self-pay | Admitting: Orthopedic Surgery

## 2016-09-02 ENCOUNTER — Telehealth (INDEPENDENT_AMBULATORY_CARE_PROVIDER_SITE_OTHER): Payer: Self-pay | Admitting: Orthopedic Surgery

## 2016-09-02 DIAGNOSIS — N186 End stage renal disease: Secondary | ICD-10-CM | POA: Diagnosis not present

## 2016-09-02 DIAGNOSIS — N2581 Secondary hyperparathyroidism of renal origin: Secondary | ICD-10-CM | POA: Diagnosis not present

## 2016-09-02 DIAGNOSIS — D631 Anemia in chronic kidney disease: Secondary | ICD-10-CM | POA: Diagnosis not present

## 2016-09-02 NOTE — Telephone Encounter (Signed)
The social worker's name is Louretta Shorten

## 2016-09-02 NOTE — Telephone Encounter (Signed)
Patient states in the last message that the fax # is wrong.  The correct fax # is [phone]

## 2016-09-02 NOTE — Telephone Encounter (Signed)
Patient called asked if Dr Sharol Given can write her a Rx for a cushioned light weight folding wheelchair. Fax to [employer], [employer]. The fax # is [phone]. The ph# [phone]. The number to contact patient is [phone]

## 2016-09-03 DIAGNOSIS — I70745 Atherosclerosis of other type of bypass graft(s) of the left leg with ulceration of other part of foot: Secondary | ICD-10-CM | POA: Diagnosis not present

## 2016-09-03 DIAGNOSIS — N186 End stage renal disease: Secondary | ICD-10-CM | POA: Diagnosis not present

## 2016-09-03 DIAGNOSIS — L97521 Non-pressure chronic ulcer of other part of left foot limited to breakdown of skin: Secondary | ICD-10-CM | POA: Diagnosis not present

## 2016-09-03 DIAGNOSIS — E1122 Type 2 diabetes mellitus with diabetic chronic kidney disease: Secondary | ICD-10-CM | POA: Diagnosis not present

## 2016-09-03 DIAGNOSIS — Z992 Dependence on renal dialysis: Secondary | ICD-10-CM | POA: Diagnosis not present

## 2016-09-03 DIAGNOSIS — I120 Hypertensive chronic kidney disease with stage 5 chronic kidney disease or end stage renal disease: Secondary | ICD-10-CM | POA: Diagnosis not present

## 2016-09-03 NOTE — Telephone Encounter (Signed)
April faxed this morning.

## 2016-09-03 NOTE — Telephone Encounter (Signed)
Pete physical therapist with [facility] left voicemail concerning a wheelchair for patient.  Talked with Laurey Arrow and advised him that a Rx for a wheelchair was faxed to social worker Tarsha at [phone]. Pete's call back number is [phone]

## 2016-09-04 DIAGNOSIS — D631 Anemia in chronic kidney disease: Secondary | ICD-10-CM | POA: Diagnosis not present

## 2016-09-04 DIAGNOSIS — N2581 Secondary hyperparathyroidism of renal origin: Secondary | ICD-10-CM | POA: Diagnosis not present

## 2016-09-04 DIAGNOSIS — N186 End stage renal disease: Secondary | ICD-10-CM | POA: Diagnosis not present

## 2016-09-07 ENCOUNTER — Encounter: Payer: Self-pay | Admitting: Vascular Surgery

## 2016-09-07 DIAGNOSIS — N186 End stage renal disease: Secondary | ICD-10-CM | POA: Diagnosis not present

## 2016-09-07 DIAGNOSIS — N2581 Secondary hyperparathyroidism of renal origin: Secondary | ICD-10-CM | POA: Diagnosis not present

## 2016-09-07 DIAGNOSIS — D631 Anemia in chronic kidney disease: Secondary | ICD-10-CM | POA: Diagnosis not present

## 2016-09-08 ENCOUNTER — Ambulatory Visit (INDEPENDENT_AMBULATORY_CARE_PROVIDER_SITE_OTHER): Payer: Medicare Other | Admitting: Orthopedic Surgery

## 2016-09-09 DIAGNOSIS — N2581 Secondary hyperparathyroidism of renal origin: Secondary | ICD-10-CM | POA: Diagnosis not present

## 2016-09-09 DIAGNOSIS — N186 End stage renal disease: Secondary | ICD-10-CM | POA: Diagnosis not present

## 2016-09-09 DIAGNOSIS — D631 Anemia in chronic kidney disease: Secondary | ICD-10-CM | POA: Diagnosis not present

## 2016-09-10 ENCOUNTER — Ambulatory Visit (INDEPENDENT_AMBULATORY_CARE_PROVIDER_SITE_OTHER): Payer: Medicare Other | Admitting: Orthopedic Surgery

## 2016-09-10 ENCOUNTER — Encounter (INDEPENDENT_AMBULATORY_CARE_PROVIDER_SITE_OTHER): Payer: Self-pay | Admitting: Orthopedic Surgery

## 2016-09-10 VITALS — Ht 65.0 in | Wt 136.0 lb

## 2016-09-10 DIAGNOSIS — Z89432 Acquired absence of left foot: Secondary | ICD-10-CM

## 2016-09-10 NOTE — Progress Notes (Signed)
Office Visit Note   Patient: Destiny TINER           Date of Birth: [birth year]           MRN: [id number] Visit Date: 09/10/2016              Requested by: Velna Hatchet, MD [address] [place], [place] [address] PCP: Velna Hatchet, MD  Chief Complaint  Patient presents with  . Left Foot - Routine Post Op    09/01/16 LEFT FOOT TRANSMETATARSAL AMPUTATION 9 days post op    HPI: The patient is a 66 year old woman who presents today 1 week status post left transmetatarsal amputation. Has been weightbearing through heel for transfers only. Questions whether she can drive with a right foot to get dialysis. No complaints of pain.    Assessment & Plan: Visit Diagnoses:  1. S/P transmetatarsal amputation of foot, right ([place])     Plan: Begin daily dial soap cleansing. Apply dry dressing daily. Can send orders to [facility] for wound care and PT. No ambulating with PT, may work on upper extremity strength.  Follow-Up Instructions: Return in about 2 weeks (around 09/24/2016).   Ortho Exam Left foot: incision is well approximated with sutures. No gaping. Minimal bloody drainage. No erythema. Minimal swelling. No sign of infection.  Imaging: No results found.  Orders:  No orders of the defined types were placed in this encounter.  No orders of the defined types were placed in this encounter.    Procedures: No procedures performed  Clinical Data: No additional findings.  Subjective: Review of Systems  Constitutional: Negative for chills and fever.  Skin: Negative for color change and wound.    Objective: Vital Signs: Ht 5\' 5"  (1.651 m)   Wt 136 lb (61.7 kg)   BMI 22.63 kg/m   Specialty Comments:  No specialty comments available.  PMFS History: Patient Active Problem List   Diagnosis Date Noted  . Onychomycosis 08/15/2016  . Gangrene of toe of left foot ([place])   . PAD (peripheral artery disease) ([place]) 07/08/2016  . Atherosclerosis of native artery of left  lower extremity with gangrene ([place]) 06/23/2016  . GERD (gastroesophageal reflux disease) 10/07/2015  . ARF (acute renal failure) ([place])   . Hypothermia 06/12/2015  . Acute on chronic renal failure ([place]) 06/12/2015  . CHF (congestive heart failure) ([employer]) 07/30/2014  . CKD (chronic kidney disease) stage 4, GFR 15-29 ml/min (H[facility]) 06/27/2014  . Hypertensive heart disease 05/25/2013  . CKD (chronic kidney disease) stage 3, GFR 30-59 ml/min 05/25/2013  . Pulmonary edema 05/23/2013  . Type 2 diabetes mellitus with diabetic nephropathy ([place]) 05/23/2013  . Type 2 diabetes mellitus with hyperosmolar nonketotic hyperglycemia ([place]) 04/13/2013  . Chronic diastolic CHF (congestive heart failure) ([place]) 04/13/2013  . UGI bleed 03/31/2013  . Hypokalemia 08/24/2012  . Type II or unspecified type diabetes mellitus without mention of complication, not stated as uncontrolled 12/27/2007  . HLD (hyperlipidemia) 12/27/2007  . ALLERGIC RHINITIS 12/27/2007  . Asthma 12/27/2007   Past Medical History:  Diagnosis Date  . Abdominal bruit   . Anemia   . Anxiety   . Arthritis    Osteoarthritis  . Asthma   . Cervical disc disease    "pinced nerve"  . CHF (congestive heart failure) ([place])   . Complication of anesthesia    " after I got home from my last procedure, I started itching."  . Diabetes mellitus    insulin dependent  . Diverticulitis   .  ESRD on peritoneal dialysis [facility])    Hemodialysis - MWF  . GERD (gastroesophageal reflux disease)    from medications  . GI bleed 03/31/2013  . History of hiatal hernia   . Hyperlipidemia   . Hypertension   . Osteoporosis   . Pneumonia   . Seasonal allergies   . Shortness of breath dyspnea    WIth exertion  . Sleep apnea    can't afford cpap    Family History  Problem Relation Age of Onset  . Other Mother     not sure of cause of death  . Diabetes Father   . Pancreatic cancer Maternal Grandmother   . Colon cancer Neg Hx     Past Surgical History:    Procedure Laterality Date  . ABDOMINAL HYSTERECTOMY  1993`  . AMPUTATION Left 09/01/2016   Procedure: LEFT FOOT TRANSMETATARSAL AMPUTATION;  Surgeon: Newt Minion, MD;  Location: [place];  Service: Orthopedics;  Laterality: Left;  . AV FISTULA PLACEMENT Left 04/21/2016   Procedure: INSERTION OF ARTERIOVENOUS (AV) GORE-TEX GRAFT ARM LEFT;  Surgeon: Elam Dutch, MD;  Location: [place];  Service: Vascular;  Laterality: Left;  . [place] TRANSPOSITION Left 07/10/2014   Procedure: BASCILIC VEIN TRANSPOSITION;  Surgeon: Angelia Mould, MD;  Location: [place];  Service: Vascular;  Laterality: Left;  . BASCILIC VEIN TRANSPOSITION Right 11/08/2014   Procedure: FIRST STAGE BASILIC VEIN TRANSPOSITION;  Surgeon: Angelia Mould, MD;  Location: [place];  Service: Vascular;  Laterality: Right;  . BASCILIC VEIN TRANSPOSITION Right 01/18/2015   Procedure: SECOND STAGE BASILIC VEIN TRANSPOSITION;  Surgeon: Angelia Mould, MD;  Location: [facility] OR;  Service: Vascular;  Laterality: Right;  . ESOPHAGOGASTRODUODENOSCOPY N/A 03/31/2013   Procedure: ESOPHAGOGASTRODUODENOSCOPY (EGD);  Surgeon: Gatha Mayer, MD;  Location: [facility] ENDOSCOPY;  Service: Endoscopy;  Laterality: N/A;  . EYE SURGERY     laser surgery  . FEMORAL-POPLITEAL BYPASS GRAFT Left 07/08/2016   Procedure: LEFT  FEMORAL-BELOW KNEE POPLITEAL ARTERY BYPASS GRAFT USING 6MM X 80 CM PROPATEN GORETEX GRAFT WITH RINGS.;  Surgeon: Rosetta Posner, MD;  Location: [place];  Service: Vascular;  Laterality: Left;  . FISTULOGRAM Left 10/29/2014   Procedure: FISTULOGRAM;  Surgeon: Angelia Mould, MD;  Location: [facility] CATH LAB;  Service: Cardiovascular;  Laterality: Left;  . LIGATION OF ARTERIOVENOUS  FISTULA Left 04/21/2016   Procedure: LIGATION OF ARTERIOVENOUS  FISTULA LEFT ARM;  Surgeon: Elam Dutch, MD;  Location: [place];  Service: Vascular;  Laterality: Left;  . PATCH ANGIOPLASTY Right 01/18/2015   Procedure: BASILIC VEIN PATCH ANGIOPLASTY USING  VASCUGUARD PATCH;  Surgeon: Angelia Mould, MD;  Location: [place];  Service: Vascular;  Laterality: Right;  . PERIPHERAL VASCULAR CATHETERIZATION N/A 06/23/2016   Procedure: Abdominal Aortogram w/Lower Extremity;  Surgeon: Serafina Mitchell, MD;  Location: [place] CV LAB;  Service: Cardiovascular;  Laterality: N/A;  . PERIPHERAL VASCULAR CATHETERIZATION  06/23/2016   Procedure: Peripheral Vascular Intervention;  Surgeon: Serafina Mitchell, MD;  Location: [place] CV LAB;  Service: Cardiovascular;;  lt common and external illiac artery   Social History   Occupational History  . Retired    Social History Main Topics  . Smoking status: Former Smoker    Packs/day: 0.35    Years: 40.00    Types: Cigarettes    Quit date: 05/10/2012  . Smokeless tobacco: Never Used  . Alcohol use No  . Drug use: Yes     Comment: marijuana; quit  in early 1980's  . Sexual activity: Yes

## 2016-09-11 ENCOUNTER — Other Ambulatory Visit (INDEPENDENT_AMBULATORY_CARE_PROVIDER_SITE_OTHER): Payer: Self-pay

## 2016-09-11 DIAGNOSIS — N186 End stage renal disease: Secondary | ICD-10-CM | POA: Diagnosis not present

## 2016-09-11 DIAGNOSIS — N2581 Secondary hyperparathyroidism of renal origin: Secondary | ICD-10-CM | POA: Diagnosis not present

## 2016-09-11 DIAGNOSIS — D631 Anemia in chronic kidney disease: Secondary | ICD-10-CM | POA: Diagnosis not present

## 2016-09-12 DIAGNOSIS — N186 End stage renal disease: Secondary | ICD-10-CM | POA: Diagnosis not present

## 2016-09-12 DIAGNOSIS — L97521 Non-pressure chronic ulcer of other part of left foot limited to breakdown of skin: Secondary | ICD-10-CM | POA: Diagnosis not present

## 2016-09-12 DIAGNOSIS — I70745 Atherosclerosis of other type of bypass graft(s) of the left leg with ulceration of other part of foot: Secondary | ICD-10-CM | POA: Diagnosis not present

## 2016-09-12 DIAGNOSIS — E1122 Type 2 diabetes mellitus with diabetic chronic kidney disease: Secondary | ICD-10-CM | POA: Diagnosis not present

## 2016-09-12 DIAGNOSIS — I120 Hypertensive chronic kidney disease with stage 5 chronic kidney disease or end stage renal disease: Secondary | ICD-10-CM | POA: Diagnosis not present

## 2016-09-12 DIAGNOSIS — Z992 Dependence on renal dialysis: Secondary | ICD-10-CM | POA: Diagnosis not present

## 2016-09-14 DIAGNOSIS — N186 End stage renal disease: Secondary | ICD-10-CM | POA: Diagnosis not present

## 2016-09-14 DIAGNOSIS — N2581 Secondary hyperparathyroidism of renal origin: Secondary | ICD-10-CM | POA: Diagnosis not present

## 2016-09-14 DIAGNOSIS — D631 Anemia in chronic kidney disease: Secondary | ICD-10-CM | POA: Diagnosis not present

## 2016-09-15 ENCOUNTER — Ambulatory Visit (HOSPITAL_COMMUNITY)
Admission: RE | Admit: 2016-09-15 | Discharge: 2016-09-15 | Disposition: A | Discharge status: Home / Self Care | Payer: Medicare Other | Source: Ambulatory Visit | Attending: Vascular Surgery | Admitting: Vascular Surgery

## 2016-09-15 ENCOUNTER — Encounter: Payer: Self-pay | Admitting: Vascular Surgery

## 2016-09-15 ENCOUNTER — Ambulatory Visit (INDEPENDENT_AMBULATORY_CARE_PROVIDER_SITE_OTHER): Payer: Self-pay | Admitting: Vascular Surgery

## 2016-09-15 ENCOUNTER — Encounter (HOSPITAL_COMMUNITY): Payer: Medicare Other

## 2016-09-15 ENCOUNTER — Telehealth (INDEPENDENT_AMBULATORY_CARE_PROVIDER_SITE_OTHER): Payer: Self-pay | Admitting: *Deleted

## 2016-09-15 ENCOUNTER — Other Ambulatory Visit: Payer: Self-pay | Admitting: *Deleted

## 2016-09-15 ENCOUNTER — Other Ambulatory Visit: Payer: Self-pay | Admitting: Vascular Surgery

## 2016-09-15 VITALS — BP 165/84 | HR 84 | Temp 97.4°F | Resp 18 | Ht 65.0 in | Wt 136.0 lb

## 2016-09-15 DIAGNOSIS — E119 Type 2 diabetes mellitus without complications: Secondary | ICD-10-CM | POA: Insufficient documentation

## 2016-09-15 DIAGNOSIS — I10 Essential (primary) hypertension: Secondary | ICD-10-CM | POA: Insufficient documentation

## 2016-09-15 DIAGNOSIS — E785 Hyperlipidemia, unspecified: Secondary | ICD-10-CM | POA: Diagnosis not present

## 2016-09-15 DIAGNOSIS — R0989 Other specified symptoms and signs involving the circulatory and respiratory systems: Secondary | ICD-10-CM | POA: Diagnosis present

## 2016-09-15 DIAGNOSIS — R938 Abnormal findings on diagnostic imaging of other specified body structures: Secondary | ICD-10-CM | POA: Insufficient documentation

## 2016-09-15 DIAGNOSIS — I739 Peripheral vascular disease, unspecified: Secondary | ICD-10-CM

## 2016-09-15 DIAGNOSIS — I70245 Atherosclerosis of native arteries of left leg with ulceration of other part of foot: Secondary | ICD-10-CM | POA: Diagnosis not present

## 2016-09-15 DIAGNOSIS — N186 End stage renal disease: Secondary | ICD-10-CM

## 2016-09-15 DIAGNOSIS — Z87891 Personal history of nicotine dependence: Secondary | ICD-10-CM | POA: Diagnosis not present

## 2016-09-15 NOTE — Telephone Encounter (Signed)
Pt came in today stating that a fax for the wheelchair was re faxed to [place] to be filled out and sent back. Pt is asking about the status of this.

## 2016-09-15 NOTE — Progress Notes (Signed)
Patient name: Destiny Day MRN: [id number] DOB: 04-15-1951 Sex: female  REASON FOR VISIT: Follow-up left femoral to below-knee popliteal bypass with Gore-Tex on 07/08/2016  HPI: Destiny Day is a 66 y.o. female here for follow-up. He subsequently undergone left transmetatarsal amputation with Dr. Sharol Given on 09/01/2016. She looks quite good today. His reports she is having difficulty with her left upper arm AV Gore-Tex graft clotting her dialysis machine and apparently we have been requested to evaluate this further. She is able to walk with a walker.  Current Outpatient Prescriptions  Medication Sig Dispense Refill  . albuterol (PROVENTIL HFA;VENTOLIN HFA) 108 (90 BASE) MCG/ACT inhaler Inhale 1-2 puffs into the lungs every 6 (six) hours as needed for wheezing or shortness of breath.    Marland Kitchen amLODipine (NORVASC) 10 MG tablet Take 10 mg by mouth at bedtime.     Marland Kitchen aspirin 81 MG chewable tablet Chew 81 mg by mouth every evening.     Marland Kitchen atorvastatin (LIPITOR) 20 MG tablet Take 20 mg by mouth at bedtime.     . budesonide-formoterol (SYMBICORT) 160-4.5 MCG/ACT inhaler Inhale 1 puff into the lungs every evening.    . carvedilol (COREG) 12.5 MG tablet Take 12.5 mg by mouth 2 (two) times daily.     . cephALEXin (KEFLEX) 500 MG capsule     . cinacalcet (SENSIPAR) 30 MG tablet Take 30 mg by mouth daily with supper.     . clopidogrel (PLAVIX) 75 MG tablet Take 1 tablet (75 mg total) by mouth every evening.    . Ferric Citrate (AURYXIA) 1 GM 210 MG(Fe) TABS Take 630 mg by mouth 3 (three) times daily with meals.    . fluticasone (FLONASE) 50 MCG/ACT nasal spray Place 1 spray into both nostrils daily as needed for allergies or rhinitis.    Marland Kitchen insulin lispro (HUMALOG) 100 UNIT/ML injection Inject 0-8 Units into the skin 3 (three) times daily before meals. 0-59 = hypoglycemic protocol, 60-149 = 0 units, 150-250 = 5 units, 251-300 = 8 units, >301 notify MD/NP    .  isosorbide-hydrALAZINE (BIDIL) 20-37.5 MG tablet Take 1 tablet by mouth 2 (two) times daily. 60 tablet 0  . LEVEMIR FLEXTOUCH 100 UNIT/ML Pen Inject 10 Units into the skin at bedtime.     Marland Kitchen loratadine (CLARITIN) 10 MG tablet Take 10 mg by mouth daily as needed for allergies.    . multivitamin (RENA-VIT) TABS tablet Take 1 tablet by mouth every evening.   5  . omeprazole (PRILOSEC) 20 MG capsule Take 20 mg by mouth daily.     Marland Kitchen oxyCODONE-acetaminophen (ROXICET) 5-325 MG tablet Take 1 tablet by mouth every 4 (four) hours as needed for severe pain. 60 tablet 0  . Vitamin D, Ergocalciferol, (DRISDOL) 50000 units CAPS capsule Take 50,000 Units by mouth every 7 (seven) days. Mondays  3   No current facility-administered medications for this visit.      PHYSICAL EXAM: Vitals:   09/15/16 0921 09/15/16 0923  BP: (!) 168/83 (!) 165/84  Pulse: 84   Resp: 18   Temp: 97.4 F (36.3 C)   TempSrc: Oral   SpO2: 96%   Weight: 136 lb (61.7 kg)   Height: 5\' 5"  (1.651 m)     GENERAL: The patient is a well-nourished female, in no acute distress. The vital signs are documented above. Has palpable popliteal pulse. Her dressing is intact on her amputation which was not removed. Her surgical incisions in her left leg are all completely healed  She does have a good thrill in her upper arm graft.  Noninvasive studies today reveal ankle arm index of 0.93 on the left and 0.43 on the right graft scan on the left reveals patency throughout her bypass.  MEDICAL ISSUES: Stable status post left femoropopliteal bypass. Will be seen again in 6 months with repeat noninvasive studies. She has notify should she develop any recurrent ischemia in her right or left leg.  We'll schedule her for outpatient left arm shuntogram to rule out any correctable cause of her graft clotting her dialysis machine. She has hemodialysis on Monday Wednesday Friday so this will be scheduled on a Tuesday or Thursday at her  convenience   Rosetta Posner, MD [facility] Tel [phone] Pager [phone]

## 2016-09-15 NOTE — Telephone Encounter (Signed)
I called and left a voicemail for the patient advised that it would be faxed back this afternoon.

## 2016-09-16 ENCOUNTER — Telehealth (INDEPENDENT_AMBULATORY_CARE_PROVIDER_SITE_OTHER): Payer: Self-pay | Admitting: Orthopedic Surgery

## 2016-09-16 DIAGNOSIS — N2581 Secondary hyperparathyroidism of renal origin: Secondary | ICD-10-CM | POA: Diagnosis not present

## 2016-09-16 DIAGNOSIS — D631 Anemia in chronic kidney disease: Secondary | ICD-10-CM | POA: Diagnosis not present

## 2016-09-16 DIAGNOSIS — N186 End stage renal disease: Secondary | ICD-10-CM | POA: Diagnosis not present

## 2016-09-16 NOTE — Telephone Encounter (Signed)
Sonia Side from [facility] called needing more information on a referral for this patient. CB # [phone]

## 2016-09-17 ENCOUNTER — Telehealth (INDEPENDENT_AMBULATORY_CARE_PROVIDER_SITE_OTHER): Payer: Self-pay | Admitting: Orthopedic Surgery

## 2016-09-17 DIAGNOSIS — Z992 Dependence on renal dialysis: Secondary | ICD-10-CM | POA: Diagnosis not present

## 2016-09-17 DIAGNOSIS — E1122 Type 2 diabetes mellitus with diabetic chronic kidney disease: Secondary | ICD-10-CM | POA: Diagnosis not present

## 2016-09-17 DIAGNOSIS — N186 End stage renal disease: Secondary | ICD-10-CM | POA: Diagnosis not present

## 2016-09-17 DIAGNOSIS — I70745 Atherosclerosis of other type of bypass graft(s) of the left leg with ulceration of other part of foot: Secondary | ICD-10-CM | POA: Diagnosis not present

## 2016-09-17 DIAGNOSIS — L97521 Non-pressure chronic ulcer of other part of left foot limited to breakdown of skin: Secondary | ICD-10-CM | POA: Diagnosis not present

## 2016-09-17 DIAGNOSIS — I120 Hypertensive chronic kidney disease with stage 5 chronic kidney disease or end stage renal disease: Secondary | ICD-10-CM | POA: Diagnosis not present

## 2016-09-17 NOTE — Telephone Encounter (Signed)
Duplicate message. 

## 2016-09-17 NOTE — Telephone Encounter (Signed)
Sonia Side from [facility] called wanting to ask you a few questions about the patient. CB # [phone]

## 2016-09-18 DIAGNOSIS — N186 End stage renal disease: Secondary | ICD-10-CM | POA: Diagnosis not present

## 2016-09-18 DIAGNOSIS — D631 Anemia in chronic kidney disease: Secondary | ICD-10-CM | POA: Diagnosis not present

## 2016-09-18 DIAGNOSIS — N2581 Secondary hyperparathyroidism of renal origin: Secondary | ICD-10-CM | POA: Diagnosis not present

## 2016-09-18 NOTE — Telephone Encounter (Signed)
Demo sheet and order with last dictation printed for pick up from [place]

## 2016-09-19 DIAGNOSIS — I120 Hypertensive chronic kidney disease with stage 5 chronic kidney disease or end stage renal disease: Secondary | ICD-10-CM | POA: Diagnosis not present

## 2016-09-19 DIAGNOSIS — I70745 Atherosclerosis of other type of bypass graft(s) of the left leg with ulceration of other part of foot: Secondary | ICD-10-CM | POA: Diagnosis not present

## 2016-09-19 DIAGNOSIS — N186 End stage renal disease: Secondary | ICD-10-CM | POA: Diagnosis not present

## 2016-09-19 DIAGNOSIS — Z992 Dependence on renal dialysis: Secondary | ICD-10-CM | POA: Diagnosis not present

## 2016-09-19 DIAGNOSIS — L97521 Non-pressure chronic ulcer of other part of left foot limited to breakdown of skin: Secondary | ICD-10-CM | POA: Diagnosis not present

## 2016-09-19 DIAGNOSIS — E1122 Type 2 diabetes mellitus with diabetic chronic kidney disease: Secondary | ICD-10-CM | POA: Diagnosis not present

## 2016-09-21 ENCOUNTER — Ambulatory Visit (INDEPENDENT_AMBULATORY_CARE_PROVIDER_SITE_OTHER): Payer: Medicare Other | Admitting: Orthopedic Surgery

## 2016-09-21 DIAGNOSIS — N2581 Secondary hyperparathyroidism of renal origin: Secondary | ICD-10-CM | POA: Diagnosis not present

## 2016-09-21 DIAGNOSIS — D631 Anemia in chronic kidney disease: Secondary | ICD-10-CM | POA: Diagnosis not present

## 2016-09-21 DIAGNOSIS — N186 End stage renal disease: Secondary | ICD-10-CM | POA: Diagnosis not present

## 2016-09-21 DIAGNOSIS — Z89432 Acquired absence of left foot: Secondary | ICD-10-CM | POA: Insufficient documentation

## 2016-09-21 NOTE — Progress Notes (Signed)
Office Visit Note   Patient: Destiny Day           Date of Birth: 16-Sep-1950           MRN: [id number] Visit Date: 09/21/2016              Requested by: Velna Hatchet, MD [place], [place] [address] PCP: Velna Hatchet, MD  Chief Complaint  Patient presents with  . Left Foot - Wound Check    09/01/16 LEFT FOOT TRANSMETATARSAL AMPUTATION     HPI: Patient is a walk in office visit today. She states that she has not removed the dressing that we applied almost 2 weeks ago. She did try to remove it today but it was "stuck" walked in today to have gauze removed. The incision is well approximated and the stitches are intact. She is weight bearing with a walker and in a post op shoe. There was a spot amount of dried bloody drainage. Pamella Pert, [place]  Dialysis Monday Wednesday Friday  Assessment & Plan: Visit Diagnoses:  1. S/P transmetatarsal amputation of foot, left ([place])     Plan: Continue protected weightbearing with her walker. Dial soap cleansing daily we will harvest the sutures today apply 4 x 4 and an Ace wrap.  Follow-Up Instructions: Return in about 3 weeks (around 10/12/2016).   Ortho Exam On examination the incision is well-healed there is no drainage no cellulitis. Her foot is plantigrade she was given instructions for heel cord stretching to do daily.  Imaging: No results found.  Orders:  No orders of the defined types were placed in this encounter.  No orders of the defined types were placed in this encounter.    Procedures: No procedures performed  Clinical Data: No additional findings.  Subjective: Review of Systems  Objective: Vital Signs: There were no vitals taken for this visit.  Specialty Comments:  No specialty comments available.  PMFS History: Patient Active Problem List   Diagnosis Date Noted  . S/P transmetatarsal amputation of foot, left ([place]) 09/21/2016  . Onychomycosis 08/15/2016  . Gangrene of toe of left  foot ([place])   . PAD (peripheral artery disease) ([place]) 07/08/2016  . Atherosclerosis of native artery of left lower extremity with gangrene ([place]) 06/23/2016  . GERD (gastroesophageal reflux disease) 10/07/2015  . ARF (acute renal failure) ([place])   . Hypothermia 06/12/2015  . Acute on chronic renal failure ([place]) 06/12/2015  . CHF (congestive heart failure) ([place]) 07/30/2014  . CKD (chronic kidney disease) stage 4, GFR 15-29 ml/min (HCC) 06/27/2014  . Hypertensive heart disease 05/25/2013  . CKD (chronic kidney disease) stage 3, GFR 30-59 ml/min 05/25/2013  . Pulmonary edema 05/23/2013  . Type 2 diabetes mellitus with diabetic nephropathy ([place]) 05/23/2013  . Type 2 diabetes mellitus with hyperosmolar nonketotic hyperglycemia ([place]) 04/13/2013  . Chronic diastolic CHF (congestive heart failure) ([place]) 04/13/2013  . UGI bleed 03/31/2013  . Hypokalemia 08/24/2012  . Type II or unspecified type diabetes mellitus without mention of complication, not stated as uncontrolled 12/27/2007  . HLD (hyperlipidemia) 12/27/2007  . ALLERGIC RHINITIS 12/27/2007  . Asthma 12/27/2007   Past Medical History:  Diagnosis Date  . Abdominal bruit   . Anemia   . Anxiety   . Arthritis    Osteoarthritis  . Asthma   . Cervical disc disease    "pinced nerve"  . CHF (congestive heart failure) ([place])   . Complication of anesthesia    " after I got home from  my last procedure, I started itching."  . Diabetes mellitus    insulin dependent  . Diverticulitis   . ESRD on peritoneal dialysis [facility])    Hemodialysis - MWF  . GERD (gastroesophageal reflux disease)    from medications  . GI bleed 03/31/2013  . History of hiatal hernia   . Hyperlipidemia   . Hypertension   . Osteoporosis   . Pneumonia   . Seasonal allergies   . Shortness of breath dyspnea    WIth exertion  . Sleep apnea    can't afford cpap    Family History  Problem Relation Age of Onset  . Other Mother     not sure of cause of death  .  Diabetes Father   . Pancreatic cancer Maternal Grandmother   . Colon cancer Neg Hx     Past Surgical History:  Procedure Laterality Date  . ABDOMINAL HYSTERECTOMY  1993`  . AMPUTATION Left 09/01/2016   Procedure: LEFT FOOT TRANSMETATARSAL AMPUTATION;  Surgeon: Newt Minion, MD;  Location: [place];  Service: Orthopedics;  Laterality: Left;  . AV FISTULA PLACEMENT Left 04/21/2016   Procedure: INSERTION OF ARTERIOVENOUS (AV) GORE-TEX GRAFT ARM LEFT;  Surgeon: Elam Dutch, MD;  Location: [place];  Service: Vascular;  Laterality: Left;  . [place] TRANSPOSITION Left 07/10/2014   Procedure: BASCILIC VEIN TRANSPOSITION;  Surgeon: Angelia Mould, MD;  Location: [place];  Service: Vascular;  Laterality: Left;  . BASCILIC VEIN TRANSPOSITION Right 11/08/2014   Procedure: FIRST STAGE BASILIC VEIN TRANSPOSITION;  Surgeon: Angelia Mould, MD;  Location: [place];  Service: Vascular;  Laterality: Right;  . BASCILIC VEIN TRANSPOSITION Right 01/18/2015   Procedure: SECOND STAGE BASILIC VEIN TRANSPOSITION;  Surgeon: Angelia Mould, MD;  Location: [place] OR;  Service: Vascular;  Laterality: Right;  . ESOPHAGOGASTRODUODENOSCOPY N/A 03/31/2013   Procedure: ESOPHAGOGASTRODUODENOSCOPY (EGD);  Surgeon: Gatha Mayer, MD;  Location: [facility] ENDOSCOPY;  Service: Endoscopy;  Laterality: N/A;  . EYE SURGERY     laser surgery  . FEMORAL-POPLITEAL BYPASS GRAFT Left 07/08/2016   Procedure: LEFT  FEMORAL-BELOW KNEE POPLITEAL ARTERY BYPASS GRAFT USING 6MM X 80 CM PROPATEN GORETEX GRAFT WITH RINGS.;  Surgeon: Rosetta Posner, MD;  Location: [place];  Service: Vascular;  Laterality: Left;  . FISTULOGRAM Left 10/29/2014   Procedure: FISTULOGRAM;  Surgeon: Angelia Mould, MD;  Location: [facility] CATH LAB;  Service: Cardiovascular;  Laterality: Left;  . LIGATION OF ARTERIOVENOUS  FISTULA Left 04/21/2016   Procedure: LIGATION OF ARTERIOVENOUS  FISTULA LEFT ARM;  Surgeon: Elam Dutch, MD;  Location: [place];  Service:  Vascular;  Laterality: Left;  . PATCH ANGIOPLASTY Right 01/18/2015   Procedure: BASILIC VEIN PATCH ANGIOPLASTY USING VASCUGUARD PATCH;  Surgeon: Angelia Mould, MD;  Location: [place];  Service: Vascular;  Laterality: Right;  . PERIPHERAL VASCULAR CATHETERIZATION N/A 06/23/2016   Procedure: Abdominal Aortogram w/Lower Extremity;  Surgeon: Serafina Mitchell, MD;  Location: [place] CV LAB;  Service: Cardiovascular;  Laterality: N/A;  . PERIPHERAL VASCULAR CATHETERIZATION  06/23/2016   Procedure: Peripheral Vascular Intervention;  Surgeon: Serafina Mitchell, MD;  Location: [place] CV LAB;  Service: Cardiovascular;;  lt common and external illiac artery   Social History   Occupational History  . Retired    Social History Main Topics  . Smoking status: Former Smoker    Packs/day: 0.35    Years: 40.00    Types: Cigarettes    Quit date: 05/10/2012  . Smokeless  tobacco: Never Used  . Alcohol use No  . Drug use: Yes     Comment: marijuana; quit in early 1980's  . Sexual activity: Yes

## 2016-09-22 ENCOUNTER — Ambulatory Visit (HOSPITAL_COMMUNITY)
Admission: RE | Admit: 2016-09-22 | Discharge: 2016-09-22 | Disposition: A | Discharge status: Home / Self Care | Payer: Medicare Other | Source: Ambulatory Visit | Attending: Surgery | Admitting: Surgery

## 2016-09-22 ENCOUNTER — Encounter (HOSPITAL_COMMUNITY): Payer: Self-pay | Admitting: Surgery

## 2016-09-22 ENCOUNTER — Encounter (HOSPITAL_COMMUNITY)
Admission: RE | Disposition: A | Discharge status: Home / Self Care | Payer: Self-pay | Source: Ambulatory Visit | Attending: Surgery

## 2016-09-22 DIAGNOSIS — Y832 Surgical operation with anastomosis, bypass or graft as the cause of abnormal reaction of the patient, or of later complication, without mention of misadventure at the time of the procedure: Secondary | ICD-10-CM | POA: Insufficient documentation

## 2016-09-22 DIAGNOSIS — T82898A Other specified complication of vascular prosthetic devices, implants and grafts, initial encounter: Secondary | ICD-10-CM | POA: Insufficient documentation

## 2016-09-22 DIAGNOSIS — N186 End stage renal disease: Secondary | ICD-10-CM | POA: Diagnosis not present

## 2016-09-22 DIAGNOSIS — Z992 Dependence on renal dialysis: Secondary | ICD-10-CM | POA: Diagnosis not present

## 2016-09-22 HISTORY — PX: PERIPHERAL VASCULAR BALLOON ANGIOPLASTY: CATH118281

## 2016-09-22 HISTORY — PX: A/V SHUNTOGRAM: CATH118297

## 2016-09-22 LAB — POCT I-STAT, CHEM 8
BUN: 24 mg/dL — ABNORMAL HIGH (ref 6–20)
Calcium, Ion: 1.02 mmol/L — ABNORMAL LOW (ref 1.15–1.40)
Chloride: 97 mmol/L — ABNORMAL LOW (ref 101–111)
Creatinine, Ser: 3.9 mg/dL — ABNORMAL HIGH (ref 0.44–1.00)
Glucose, Bld: 86 mg/dL (ref 65–99)
HCT: 39 % (ref 36.0–46.0)
Hemoglobin: 13.3 g/dL (ref 12.0–15.0)
Potassium: 4 mmol/L (ref 3.5–5.1)
Sodium: 138 mmol/L (ref 135–145)
TCO2: 31 mmol/L (ref 0–100)

## 2016-09-22 LAB — GLUCOSE, CAPILLARY: Glucose-Capillary: 93 mg/dL (ref 65–99)

## 2016-09-22 SURGERY — A/V SHUNTOGRAM

## 2016-09-22 MED ORDER — SODIUM CHLORIDE 0.9% FLUSH: 3.0000 mL | INTRAVENOUS | Status: DC | PRN

## 2016-09-22 MED ORDER — HEPARIN (PORCINE) IN NACL 2-0.9 UNIT/ML-% IJ SOLN
INTRAMUSCULAR | Status: AC
  Filled 2016-09-22: qty 500

## 2016-09-22 MED ORDER — SODIUM CHLORIDE 0.9% FLUSH: 3.0000 mL | Freq: Two times a day (BID) | INTRAVENOUS | Status: DC

## 2016-09-22 MED ORDER — SODIUM CHLORIDE 0.9 % IV SOLN: 250.0000 mL | INTRAVENOUS | Status: DC | PRN

## 2016-09-22 MED ORDER — MIDAZOLAM HCL 2 MG/2ML IJ SOLN
INTRAMUSCULAR | Status: DC | PRN
  Administered 2016-09-22: 1 mg via INTRAVENOUS

## 2016-09-22 MED ORDER — HEPARIN (PORCINE) IN NACL 2-0.9 UNIT/ML-% IJ SOLN
INTRAMUSCULAR | Status: DC | PRN
  Administered 2016-09-22: 500 mL

## 2016-09-22 MED ORDER — FENTANYL CITRATE (PF) 100 MCG/2ML IJ SOLN
INTRAMUSCULAR | Status: DC | PRN
  Administered 2016-09-22: 25 ug via INTRAVENOUS

## 2016-09-22 MED ORDER — LIDOCAINE HCL (PF) 1 % IJ SOLN
INTRAMUSCULAR | Status: DC | PRN
  Administered 2016-09-22: 3 mL

## 2016-09-22 MED ORDER — IODIXANOL 320 MG/ML IV SOLN
INTRAVENOUS | Status: DC | PRN
  Administered 2016-09-22: 40 mL via INTRAVENOUS

## 2016-09-22 MED ORDER — MIDAZOLAM HCL 2 MG/2ML IJ SOLN
INTRAMUSCULAR | Status: AC
  Filled 2016-09-22: qty 2

## 2016-09-22 MED ORDER — LIDOCAINE HCL (PF) 1 % IJ SOLN
INTRAMUSCULAR | Status: AC
  Filled 2016-09-22: qty 30

## 2016-09-22 MED ORDER — FENTANYL CITRATE (PF) 100 MCG/2ML IJ SOLN
INTRAMUSCULAR | Status: AC
  Filled 2016-09-22: qty 2

## 2016-09-22 SURGICAL SUPPLY — 15 items
BAG SNAP BAND KOVER 36X36 (MISCELLANEOUS) ×3 IMPLANT
BALLN MUSTANG 7.0X40 75 (BALLOONS) ×3
BALLOON MUSTANG 7.0X40 75 (BALLOONS) ×2 IMPLANT
COVER DOME SNAP 22 D (MISCELLANEOUS) ×3 IMPLANT
COVER PRB 48X5XTLSCP FOLD TPE (BAG) ×2 IMPLANT
COVER PROBE 5X48 (BAG) ×1
KIT ENCORE 26 ADVANTAGE (KITS) ×3 IMPLANT
PROTECTION STATION PRESSURIZED (MISCELLANEOUS) ×3
SHEATH PINNACLE R/O II 5F 6CM (SHEATH) ×3 IMPLANT
STATION PROTECTION PRESSURIZED (MISCELLANEOUS) ×2 IMPLANT
STOPCOCK MORSE 400PSI 3WAY (MISCELLANEOUS) ×3 IMPLANT
TRAY PV CATH (CUSTOM PROCEDURE TRAY) ×3 IMPLANT
TUBING CIL FLEX 10 FLL-RA (TUBING) ×3 IMPLANT
WIRE BENTSON .035X145CM (WIRE) ×3 IMPLANT
WIRE MINI STICK MAX (SHEATH) ×3 IMPLANT

## 2016-09-22 NOTE — Op Note (Signed)
    Patient name: Destiny Day MRN: [id number] DOB: 1951-07-26 Sex: female  09/22/2016 Pre-operative Diagnosis: End-stage renal disease Post-operative diagnosis:  Same Surgeon:  Annamarie Major Procedure Performed:  1.  Ultrasound-guided access, left upper arm dialysis graft  2.  Shuntogram  3.  Angioplasty, left brachial vein  4.  Conscious sedation (25 minutes)    Indications:  The patient has been pulling clots from her dialysis access for the past week.  She comes in today for shuntogram  Procedure:  The patient was identified in the holding area and taken to room 8.  The patient was then placed supine on the table and prepped and draped in the usual sterile fashion.  A time out was called.  Conscious sedation was performed with the use of IV fentanyl and Versed under continuous physician and nurse monitoring.  Heart rate, blood pressure, and oxygen saturations were continuously monitored.  Ultrasound was used to evaluate the fistula.  The vein was patent and compressible.  A digital ultrasound image was acquired.  The fistula was then accessed under ultrasound guidance using a micropuncture needle.  An 018 wire was then asvanced without resistance and a micropuncture sheath was placed.  Contrast injections were then performed through the sheath.  Findings:  The arterial anastomosis is widely patent.  The central venous system is widely patent.  I was unable to get any great view of the venous anastomosis, taking AP, cranial and caudal projections, however it does appear to be widely patent.   Intervention:  Because the patient was having difficulty with pulling clots, I suspected a venous outflow problem however angiographically I did not see this.  Since we are here I elected to dilate the area.  Over a 035 wire, a 5 French sheath was placed.  No heparin was given.  The wire was easily advanced across the venous anastomosis.  A 7 x 40 Mustang balloon was placed across the anastomosis and  taken to 15 atm.  The balloon was then removed and a completion arteriogram was performed which showed widely patent venous anastomosis.  Impression:  #1  no significant arterial anastomotic stenosis  #2  no significant central venous stenosis  #3  no significant venous outflow stenosis, however the venous anastomosis was dilated with a 7 x 40 Mustang balloon, given the patient's recent history of pulling clots, just to make sure that this area is widely patent.   Theotis Burrow, M.D. [facility] [place] [facility] Office: [phone] Pager:  [phone]

## 2016-09-22 NOTE — Discharge Instructions (Signed)
Venogram, Care After °This sheet gives you information about how to care for yourself after your procedure. Your health care provider may also give you more specific instructions. If you have problems or questions, contact your health care provider. °What can I expect after the procedure? °After the procedure, it is common to have: °· Bruising or mild discomfort in the area where the IV was inserted (insertion site). ° °Follow these instructions at home: °Eating and drinking °· Follow instructions from your health care provider about eating or drinking restrictions. °· Drink a lot of fluids for the first several days after the procedure, as directed by your health care provider. This helps to wash (flush) the contrast out of your body. Examples of healthy fluids include water or low-calorie drinks. °General instructions °· Check your IV insertion area every day for signs of infection. Check for: °? Redness, swelling, or pain. °? Fluid or blood. °? Warmth. °? Pus or a bad smell. °· Take over-the-counter and prescription medicines only as told by your health care provider. °· Rest and return to your normal activities as told by your health care provider. Ask your health care provider what activities are safe for you. °· Do not drive for 24 hours if you were given a medicine to help you relax (sedative), or until your health care provider approves. °· Keep all follow-up visits as told by your health care provider. This is important. °Contact a health care provider if: °· Your skin becomes itchy or you develop a rash or hives. °· You have a fever that does not get better with medicine. °· You feel nauseous. °· You vomit. °· You have redness, swelling, or pain around the insertion site. °· You have fluid or blood coming from the insertion site. °· Your insertion area feels warm to the touch. °· You have pus or a bad smell coming from the insertion site. °Get help right away if: °· You have difficulty breathing or  shortness of breath. °· You develop chest pain. °· You faint. °· You feel very dizzy. °These symptoms may represent a serious problem that is an emergency. Do not wait to see if the symptoms will go away. Get medical help right away. Call your local emergency services (911 in the [place] Do not drive yourself to the hospital. °Summary °· After your procedure, it is common to have bruising or mild discomfort in the area where the IV was inserted. °· You should check your IV insertion area every day for signs of infection. °· Take over-the-counter and prescription medicines only as told by your health care provider. °· You should drink a lot of fluids for the first several days after the procedure to help flush the contrast from your body. °This information is not intended to replace advice given to you by your health care provider. Make sure you discuss any questions you have with your health care provider. °Document Released: 05/03/2013 Document Revised: 06/06/2016 Document Reviewed: 06/06/2016 °Elsevier Interactive Patient Education © 2017 Else[employer] Inc[employer] ° °

## 2016-09-22 NOTE — H&P (Signed)
   Patient name: Destiny Day MRN: [id number] DOB: 05-11-1951 Sex: female  REASON FOR VISIT:     Dialysis access  HISTORY OF PRESENT ILLNESS:   Destiny Day is a 66 y.o. female who is s/p left upper arm AVGG in mid 2017 by CEF.  She is now having difficulty with pulling clots with dialysis.  She had a full run yesterday  CURRENT MEDICATIONS:    Current Facility-Administered Medications  Medication Dose Route Frequency Provider Last Rate Last Dose  . 0.9 %  sodium chloride infusion  250 mL Intravenous PRN Serafina Mitchell, MD      . sodium chloride flush (NS) 0.9 % injection 3 mL  3 mL Intravenous Q12H Serafina Mitchell, MD      . sodium chloride flush (NS) 0.9 % injection 3 mL  3 mL Intravenous PRN Serafina Mitchell, MD        REVIEW OF SYSTEMS:   [X]  denotes positive finding, [ ]  denotes negative finding Cardiac  Comments:  Chest pain or chest pressure:    Shortness of breath upon exertion:    Short of breath when lying flat:    Irregular heart rhythm:    Constitutional    Fever or chills:      PHYSICAL EXAM:   Vitals:   09/22/16 0812  BP: (!) 159/79  Pulse: 83  Resp: 18  Temp: 98 F (36.7 C)  TempSrc: Oral  SpO2: 98%  Weight: 143 lb (64.9 kg)  Height: 5\' 5"  (1.651 m)    GENERAL: The patient is a well-nourished female, in no acute distress. The vital signs are documented above. CARDIOVASCULAR: There is a regular rate and rhythm. PULMONARY: Non-labored respirations Palpable left UE AVGG thrill  STUDIES:   none  MEDICAL ISSUES:   Plan for shuntogram and intervention if indicated.  Risks and benefits discussed.  Al questions answered  Annamarie Major, MD [facility] [phone] Pager [phone]

## 2016-09-23 DIAGNOSIS — I70745 Atherosclerosis of other type of bypass graft(s) of the left leg with ulceration of other part of foot: Secondary | ICD-10-CM | POA: Diagnosis not present

## 2016-09-23 DIAGNOSIS — E1122 Type 2 diabetes mellitus with diabetic chronic kidney disease: Secondary | ICD-10-CM | POA: Diagnosis not present

## 2016-09-23 DIAGNOSIS — I120 Hypertensive chronic kidney disease with stage 5 chronic kidney disease or end stage renal disease: Secondary | ICD-10-CM | POA: Diagnosis not present

## 2016-09-23 DIAGNOSIS — D631 Anemia in chronic kidney disease: Secondary | ICD-10-CM | POA: Diagnosis not present

## 2016-09-23 DIAGNOSIS — N186 End stage renal disease: Secondary | ICD-10-CM | POA: Diagnosis not present

## 2016-09-23 DIAGNOSIS — N2581 Secondary hyperparathyroidism of renal origin: Secondary | ICD-10-CM | POA: Diagnosis not present

## 2016-09-23 DIAGNOSIS — L97521 Non-pressure chronic ulcer of other part of left foot limited to breakdown of skin: Secondary | ICD-10-CM | POA: Diagnosis not present

## 2016-09-23 DIAGNOSIS — I159 Secondary hypertension, unspecified: Secondary | ICD-10-CM | POA: Diagnosis not present

## 2016-09-23 DIAGNOSIS — Z992 Dependence on renal dialysis: Secondary | ICD-10-CM | POA: Diagnosis not present

## 2016-09-24 ENCOUNTER — Ambulatory Visit (INDEPENDENT_AMBULATORY_CARE_PROVIDER_SITE_OTHER): Payer: Medicare Other | Admitting: Orthopedic Surgery

## 2016-09-25 DIAGNOSIS — D631 Anemia in chronic kidney disease: Secondary | ICD-10-CM | POA: Diagnosis not present

## 2016-09-25 DIAGNOSIS — N186 End stage renal disease: Secondary | ICD-10-CM | POA: Diagnosis not present

## 2016-09-25 DIAGNOSIS — E1122 Type 2 diabetes mellitus with diabetic chronic kidney disease: Secondary | ICD-10-CM | POA: Diagnosis not present

## 2016-09-25 DIAGNOSIS — E876 Hypokalemia: Secondary | ICD-10-CM | POA: Diagnosis not present

## 2016-09-25 DIAGNOSIS — Z992 Dependence on renal dialysis: Secondary | ICD-10-CM | POA: Diagnosis not present

## 2016-09-25 DIAGNOSIS — N2581 Secondary hyperparathyroidism of renal origin: Secondary | ICD-10-CM | POA: Diagnosis not present

## 2016-09-28 DIAGNOSIS — E876 Hypokalemia: Secondary | ICD-10-CM | POA: Diagnosis not present

## 2016-09-28 DIAGNOSIS — N2581 Secondary hyperparathyroidism of renal origin: Secondary | ICD-10-CM | POA: Diagnosis not present

## 2016-09-28 DIAGNOSIS — D631 Anemia in chronic kidney disease: Secondary | ICD-10-CM | POA: Diagnosis not present

## 2016-09-28 DIAGNOSIS — Z992 Dependence on renal dialysis: Secondary | ICD-10-CM | POA: Diagnosis not present

## 2016-09-28 DIAGNOSIS — E1122 Type 2 diabetes mellitus with diabetic chronic kidney disease: Secondary | ICD-10-CM | POA: Diagnosis not present

## 2016-09-28 DIAGNOSIS — N186 End stage renal disease: Secondary | ICD-10-CM | POA: Diagnosis not present

## 2016-09-29 DIAGNOSIS — Z992 Dependence on renal dialysis: Secondary | ICD-10-CM | POA: Diagnosis not present

## 2016-09-29 DIAGNOSIS — E1122 Type 2 diabetes mellitus with diabetic chronic kidney disease: Secondary | ICD-10-CM | POA: Diagnosis not present

## 2016-09-29 DIAGNOSIS — I70745 Atherosclerosis of other type of bypass graft(s) of the left leg with ulceration of other part of foot: Secondary | ICD-10-CM | POA: Diagnosis not present

## 2016-09-29 DIAGNOSIS — I120 Hypertensive chronic kidney disease with stage 5 chronic kidney disease or end stage renal disease: Secondary | ICD-10-CM | POA: Diagnosis not present

## 2016-09-29 DIAGNOSIS — N186 End stage renal disease: Secondary | ICD-10-CM | POA: Diagnosis not present

## 2016-09-29 DIAGNOSIS — L97521 Non-pressure chronic ulcer of other part of left foot limited to breakdown of skin: Secondary | ICD-10-CM | POA: Diagnosis not present

## 2016-09-30 DIAGNOSIS — Z992 Dependence on renal dialysis: Secondary | ICD-10-CM | POA: Diagnosis not present

## 2016-09-30 DIAGNOSIS — N186 End stage renal disease: Secondary | ICD-10-CM | POA: Diagnosis not present

## 2016-09-30 DIAGNOSIS — E1122 Type 2 diabetes mellitus with diabetic chronic kidney disease: Secondary | ICD-10-CM | POA: Diagnosis not present

## 2016-09-30 DIAGNOSIS — D631 Anemia in chronic kidney disease: Secondary | ICD-10-CM | POA: Diagnosis not present

## 2016-09-30 DIAGNOSIS — N2581 Secondary hyperparathyroidism of renal origin: Secondary | ICD-10-CM | POA: Diagnosis not present

## 2016-09-30 DIAGNOSIS — E876 Hypokalemia: Secondary | ICD-10-CM | POA: Diagnosis not present

## 2016-10-01 DIAGNOSIS — I70745 Atherosclerosis of other type of bypass graft(s) of the left leg with ulceration of other part of foot: Secondary | ICD-10-CM | POA: Diagnosis not present

## 2016-10-01 DIAGNOSIS — I120 Hypertensive chronic kidney disease with stage 5 chronic kidney disease or end stage renal disease: Secondary | ICD-10-CM | POA: Diagnosis not present

## 2016-10-01 DIAGNOSIS — E1122 Type 2 diabetes mellitus with diabetic chronic kidney disease: Secondary | ICD-10-CM | POA: Diagnosis not present

## 2016-10-01 DIAGNOSIS — L97521 Non-pressure chronic ulcer of other part of left foot limited to breakdown of skin: Secondary | ICD-10-CM | POA: Diagnosis not present

## 2016-10-01 DIAGNOSIS — Z992 Dependence on renal dialysis: Secondary | ICD-10-CM | POA: Diagnosis not present

## 2016-10-01 DIAGNOSIS — N186 End stage renal disease: Secondary | ICD-10-CM | POA: Diagnosis not present

## 2016-10-02 DIAGNOSIS — E876 Hypokalemia: Secondary | ICD-10-CM | POA: Diagnosis not present

## 2016-10-02 DIAGNOSIS — N186 End stage renal disease: Secondary | ICD-10-CM | POA: Diagnosis not present

## 2016-10-02 DIAGNOSIS — Z992 Dependence on renal dialysis: Secondary | ICD-10-CM | POA: Diagnosis not present

## 2016-10-02 DIAGNOSIS — E1122 Type 2 diabetes mellitus with diabetic chronic kidney disease: Secondary | ICD-10-CM | POA: Diagnosis not present

## 2016-10-02 DIAGNOSIS — D631 Anemia in chronic kidney disease: Secondary | ICD-10-CM | POA: Diagnosis not present

## 2016-10-02 DIAGNOSIS — N2581 Secondary hyperparathyroidism of renal origin: Secondary | ICD-10-CM | POA: Diagnosis not present

## 2016-10-05 DIAGNOSIS — Z992 Dependence on renal dialysis: Secondary | ICD-10-CM | POA: Diagnosis not present

## 2016-10-05 DIAGNOSIS — N2581 Secondary hyperparathyroidism of renal origin: Secondary | ICD-10-CM | POA: Diagnosis not present

## 2016-10-05 DIAGNOSIS — E1122 Type 2 diabetes mellitus with diabetic chronic kidney disease: Secondary | ICD-10-CM | POA: Diagnosis not present

## 2016-10-05 DIAGNOSIS — D631 Anemia in chronic kidney disease: Secondary | ICD-10-CM | POA: Diagnosis not present

## 2016-10-05 DIAGNOSIS — N186 End stage renal disease: Secondary | ICD-10-CM | POA: Diagnosis not present

## 2016-10-05 DIAGNOSIS — E876 Hypokalemia: Secondary | ICD-10-CM | POA: Diagnosis not present

## 2016-10-06 DIAGNOSIS — E1122 Type 2 diabetes mellitus with diabetic chronic kidney disease: Secondary | ICD-10-CM | POA: Diagnosis not present

## 2016-10-06 DIAGNOSIS — I70745 Atherosclerosis of other type of bypass graft(s) of the left leg with ulceration of other part of foot: Secondary | ICD-10-CM | POA: Diagnosis not present

## 2016-10-06 DIAGNOSIS — L97521 Non-pressure chronic ulcer of other part of left foot limited to breakdown of skin: Secondary | ICD-10-CM | POA: Diagnosis not present

## 2016-10-06 DIAGNOSIS — N186 End stage renal disease: Secondary | ICD-10-CM | POA: Diagnosis not present

## 2016-10-06 DIAGNOSIS — Z992 Dependence on renal dialysis: Secondary | ICD-10-CM | POA: Diagnosis not present

## 2016-10-06 DIAGNOSIS — I120 Hypertensive chronic kidney disease with stage 5 chronic kidney disease or end stage renal disease: Secondary | ICD-10-CM | POA: Diagnosis not present

## 2016-10-07 DIAGNOSIS — D631 Anemia in chronic kidney disease: Secondary | ICD-10-CM | POA: Diagnosis not present

## 2016-10-07 DIAGNOSIS — E1122 Type 2 diabetes mellitus with diabetic chronic kidney disease: Secondary | ICD-10-CM | POA: Diagnosis not present

## 2016-10-07 DIAGNOSIS — N2581 Secondary hyperparathyroidism of renal origin: Secondary | ICD-10-CM | POA: Diagnosis not present

## 2016-10-07 DIAGNOSIS — N186 End stage renal disease: Secondary | ICD-10-CM | POA: Diagnosis not present

## 2016-10-07 DIAGNOSIS — E876 Hypokalemia: Secondary | ICD-10-CM | POA: Diagnosis not present

## 2016-10-07 DIAGNOSIS — Z992 Dependence on renal dialysis: Secondary | ICD-10-CM | POA: Diagnosis not present

## 2016-10-09 DIAGNOSIS — E1122 Type 2 diabetes mellitus with diabetic chronic kidney disease: Secondary | ICD-10-CM | POA: Diagnosis not present

## 2016-10-09 DIAGNOSIS — E876 Hypokalemia: Secondary | ICD-10-CM | POA: Diagnosis not present

## 2016-10-09 DIAGNOSIS — D631 Anemia in chronic kidney disease: Secondary | ICD-10-CM | POA: Diagnosis not present

## 2016-10-09 DIAGNOSIS — Z992 Dependence on renal dialysis: Secondary | ICD-10-CM | POA: Diagnosis not present

## 2016-10-09 DIAGNOSIS — N2581 Secondary hyperparathyroidism of renal origin: Secondary | ICD-10-CM | POA: Diagnosis not present

## 2016-10-09 DIAGNOSIS — N186 End stage renal disease: Secondary | ICD-10-CM | POA: Diagnosis not present

## 2016-10-10 DIAGNOSIS — E1122 Type 2 diabetes mellitus with diabetic chronic kidney disease: Secondary | ICD-10-CM | POA: Diagnosis not present

## 2016-10-10 DIAGNOSIS — L97521 Non-pressure chronic ulcer of other part of left foot limited to breakdown of skin: Secondary | ICD-10-CM | POA: Diagnosis not present

## 2016-10-10 DIAGNOSIS — Z992 Dependence on renal dialysis: Secondary | ICD-10-CM | POA: Diagnosis not present

## 2016-10-10 DIAGNOSIS — N186 End stage renal disease: Secondary | ICD-10-CM | POA: Diagnosis not present

## 2016-10-10 DIAGNOSIS — I70745 Atherosclerosis of other type of bypass graft(s) of the left leg with ulceration of other part of foot: Secondary | ICD-10-CM | POA: Diagnosis not present

## 2016-10-10 DIAGNOSIS — I120 Hypertensive chronic kidney disease with stage 5 chronic kidney disease or end stage renal disease: Secondary | ICD-10-CM | POA: Diagnosis not present

## 2016-10-12 DIAGNOSIS — N186 End stage renal disease: Secondary | ICD-10-CM | POA: Diagnosis not present

## 2016-10-12 DIAGNOSIS — D631 Anemia in chronic kidney disease: Secondary | ICD-10-CM | POA: Diagnosis not present

## 2016-10-12 DIAGNOSIS — E876 Hypokalemia: Secondary | ICD-10-CM | POA: Diagnosis not present

## 2016-10-12 DIAGNOSIS — Z992 Dependence on renal dialysis: Secondary | ICD-10-CM | POA: Diagnosis not present

## 2016-10-12 DIAGNOSIS — E1122 Type 2 diabetes mellitus with diabetic chronic kidney disease: Secondary | ICD-10-CM | POA: Diagnosis not present

## 2016-10-12 DIAGNOSIS — N2581 Secondary hyperparathyroidism of renal origin: Secondary | ICD-10-CM | POA: Diagnosis not present

## 2016-10-13 ENCOUNTER — Encounter (INDEPENDENT_AMBULATORY_CARE_PROVIDER_SITE_OTHER): Payer: Self-pay | Admitting: Orthopedic Surgery

## 2016-10-13 ENCOUNTER — Ambulatory Visit (INDEPENDENT_AMBULATORY_CARE_PROVIDER_SITE_OTHER): Payer: Medicare Other | Admitting: Orthopedic Surgery

## 2016-10-13 DIAGNOSIS — I87323 Chronic venous hypertension (idiopathic) with inflammation of bilateral lower extremity: Secondary | ICD-10-CM | POA: Insufficient documentation

## 2016-10-13 DIAGNOSIS — Z89432 Acquired absence of left foot: Secondary | ICD-10-CM

## 2016-10-13 NOTE — Progress Notes (Signed)
Office Visit Note   Patient: Destiny Day           Date of Birth: [birth year]           MRN: [id number] Visit Date: 10/13/2016              Requested by: Velna Hatchet, MD [address] [place], [place] [address] PCP: Velna Hatchet, MD  Chief Complaint  Patient presents with  . Left Foot - Follow-up    09/01/16 LEFT FOOT TRANSMETATARSAL AMPUTATION       QIO:[id number] is weightbearing through her heel with a walker she denies any pain or drainage she does state she has some swelling and stiffness. HPI  Assessment & Plan: Visit Diagnoses:  1. S/P transmetatarsal amputation of foot, left ([place])   2. Idiopathic chronic venous hypertension of both lower extremities with inflammation     Plan: Recommend medical compression stockings for her venous insufficiency. She is given a prescription for [employer] for extra-depth shoes custom orthotics spacer and a carbon plate. Patient was given instructions for shape but her treatment for the skin daily.  Follow-Up Instructions: Return in about 4 weeks (around 11/10/2016).   Ortho Exam Examination patient is alert oriented no adenopathy there is no redness no cellulitis she does have venous stasis swelling bilaterally but no ulcers. The wound is completely healed but she does have dry calloused skin. ROS: Review of systems negative fever or chills. Imaging: No results found.  Labs: Lab Results  Component Value Date   HGBA1C 7.5 (H) 06/24/2016   HGBA1C 9.8 (H) 10/09/2015   HGBA1C 11.3 (H) 06/13/2015   REPTSTATUS 01/16/2014 FINAL 01/10/2014   CULT  01/10/2014    NO GROWTH 5 DAYS Performed at [facility] 11/29/2008    Orders:  No orders of the defined types were placed in this encounter.  No orders of the defined types were placed in this encounter.    Procedures: No procedures performed  Clinical Data: No additional findings.  Subjective: Review of Systems  Objective: Vital  Signs: There were no vitals taken for this visit.  Specialty Comments:  No specialty comments available.  PMFS History: Patient Active Problem List   Diagnosis Date Noted  . Idiopathic chronic venous hypertension of both lower extremities with inflammation 10/13/2016  . S/P transmetatarsal amputation of foot, left ([place]) 09/21/2016  . Onychomycosis 08/15/2016  . Gangrene of toe of left foot ([place])   . PAD (peripheral artery disease) ([place]) 07/08/2016  . Atherosclerosis of native artery of left lower extremity with gangrene ([place]) 06/23/2016  . GERD (gastroesophageal reflux disease) 10/07/2015  . ARF (acute renal failure) ([place])   . Hypothermia 06/12/2015  . Acute on chronic renal failure ([place]) 06/12/2015  . CHF (congestive heart failure) ([place]) 07/30/2014  . CKD (chronic kidney disease) stage 4, GFR 15-29 ml/min (HCC) 06/27/2014  . Hypertensive heart disease 05/25/2013  . CKD (chronic kidney disease) stage 3, GFR 30-59 ml/min 05/25/2013  . Pulmonary edema 05/23/2013  . Type 2 diabetes mellitus with diabetic nephropathy ([place]) 05/23/2013  . Type 2 diabetes mellitus with hyperosmolar nonketotic hyperglycemia ([place]) 04/13/2013  . Chronic diastolic CHF (congestive heart failure) ([place]) 04/13/2013  . UGI bleed 03/31/2013  . Hypokalemia 08/24/2012  . Type II or unspecified type diabetes mellitus without mention of complication, not stated as uncontrolled 12/27/2007  . HLD (hyperlipidemia) 12/27/2007  . ALLERGIC RHINITIS 12/27/2007  . Asthma 12/27/2007   Past Medical History:  Diagnosis Date  .  Abdominal bruit   . Anemia   . Anxiety   . Arthritis    Osteoarthritis  . Asthma   . Cervical disc disease    "pinced nerve"  . CHF (congestive heart failure) ([place])   . Complication of anesthesia    " after I got home from my last procedure, I started itching."  . Diabetes mellitus    insulin dependent  . Diverticulitis   . ESRD on peritoneal dialysis [facility])    Hemodialysis - MWF  . GERD  (gastroesophageal reflux disease)    from medications  . GI bleed 03/31/2013  . History of hiatal hernia   . Hyperlipidemia   . Hypertension   . Osteoporosis   . Pneumonia   . Seasonal allergies   . Shortness of breath dyspnea    WIth exertion  . Sleep apnea    can't afford cpap    Family History  Problem Relation Age of Onset  . Other Mother     not sure of cause of death  . Diabetes Father   . Pancreatic cancer Maternal Grandmother   . Colon cancer Neg Hx     Past Surgical History:  Procedure Laterality Date  . A/V SHUNTOGRAM N/A 09/22/2016   Procedure: A/V Shuntogram - left arm;  Surgeon: Serafina Mitchell, MD;  Location: [place] CV LAB;  Service: Cardiovascular;  Laterality: N/A;  . ABDOMINAL HYSTERECTOMY  1993`  . AMPUTATION Left 09/01/2016   Procedure: LEFT FOOT TRANSMETATARSAL AMPUTATION;  Surgeon: Newt Minion, MD;  Location: [place];  Service: Orthopedics;  Laterality: Left;  . AV FISTULA PLACEMENT Left 04/21/2016   Procedure: INSERTION OF ARTERIOVENOUS (AV) GORE-TEX GRAFT ARM LEFT;  Surgeon: Elam Dutch, MD;  Location: [place];  Service: Vascular;  Laterality: Left;  . [place] TRANSPOSITION Left 07/10/2014   Procedure: BASCILIC VEIN TRANSPOSITION;  Surgeon: Angelia Mould, MD;  Location: [place];  Service: Vascular;  Laterality: Left;  . BASCILIC VEIN TRANSPOSITION Right 11/08/2014   Procedure: FIRST STAGE BASILIC VEIN TRANSPOSITION;  Surgeon: Angelia Mould, MD;  Location: [place];  Service: Vascular;  Laterality: Right;  . BASCILIC VEIN TRANSPOSITION Right 01/18/2015   Procedure: SECOND STAGE BASILIC VEIN TRANSPOSITION;  Surgeon: Angelia Mould, MD;  Location[facility] OR;  Service: Vascular;  Laterality: Right;  . ESOPHAGOGASTRODUODENOSCOPY N/A 03/31/2013   Procedure: ESOPHAGOGASTRODUODENOSCOPY (EGD);  Surgeon: Gatha Mayer, MD;  Location[facility] ENDOSCOPY;  Service: Endoscopy;  Laterality: N/A;  . EYE SURGERY     laser surgery  . FEMORAL-POPLITEAL  BYPASS GRAFT Left 07/08/2016   Procedure: LEFT  FEMORAL-BELOW KNEE POPLITEAL ARTERY BYPASS GRAFT USING 6MM X 80 CM PROPATEN GORETEX GRAFT WITH RINGS.;  Surgeon: Rosetta Posner, MD;  Location: [place];  Service: Vascular;  Laterality: Left;  . FISTULOGRAM Left 10/29/2014   Procedure: FISTULOGRAM;  Surgeon: Angelia Mould, MD;  Location: [facility] CATH LAB;  Service: Cardiovascular;  Laterality: Left;  . LIGATION OF ARTERIOVENOUS  FISTULA Left 04/21/2016   Procedure: LIGATION OF ARTERIOVENOUS  FISTULA LEFT ARM;  Surgeon: Elam Dutch, MD;  Location: [place];  Service: Vascular;  Laterality: Left;  . PATCH ANGIOPLASTY Right 01/18/2015   Procedure: BASILIC VEIN PATCH ANGIOPLASTY USING VASCUGUARD PATCH;  Surgeon: Angelia Mould, MD;  Location: [place];  Service: Vascular;  Laterality: Right;  . PERIPHERAL VASCULAR BALLOON ANGIOPLASTY  09/22/2016   Procedure: Peripheral Vascular Balloon Angioplasty;  Surgeon: Serafina Mitchell, MD;  Location: [place] CV LAB;  Service:  Cardiovascular;;  Lt. Fistula  . PERIPHERAL VASCULAR CATHETERIZATION N/A 06/23/2016   Procedure: Abdominal Aortogram w/Lower Extremity;  Surgeon: Serafina Mitchell, MD;  Location: [place] CV LAB;  Service: Cardiovascular;  Laterality: N/A;  . PERIPHERAL VASCULAR CATHETERIZATION  06/23/2016   Procedure: Peripheral Vascular Intervention;  Surgeon: Serafina Mitchell, MD;  Location: [place] CV LAB;  Service: Cardiovascular;;  lt common and external illiac artery   Social History   Occupational History  . Retired    Social History Main Topics  . Smoking status: Former Smoker    Packs/day: 0.35    Years: 40.00    Types: Cigarettes    Quit date: 05/10/2012  . Smokeless tobacco: Never Used  . Alcohol use No  . Drug use: Yes     Comment: marijuana; quit in early 1980's  . Sexual activity: Yes

## 2016-10-14 DIAGNOSIS — E1122 Type 2 diabetes mellitus with diabetic chronic kidney disease: Secondary | ICD-10-CM | POA: Diagnosis not present

## 2016-10-14 DIAGNOSIS — D631 Anemia in chronic kidney disease: Secondary | ICD-10-CM | POA: Diagnosis not present

## 2016-10-14 DIAGNOSIS — E876 Hypokalemia: Secondary | ICD-10-CM | POA: Diagnosis not present

## 2016-10-14 DIAGNOSIS — N186 End stage renal disease: Secondary | ICD-10-CM | POA: Diagnosis not present

## 2016-10-14 DIAGNOSIS — N2581 Secondary hyperparathyroidism of renal origin: Secondary | ICD-10-CM | POA: Diagnosis not present

## 2016-10-14 DIAGNOSIS — Z992 Dependence on renal dialysis: Secondary | ICD-10-CM | POA: Diagnosis not present

## 2016-10-15 DIAGNOSIS — I70745 Atherosclerosis of other type of bypass graft(s) of the left leg with ulceration of other part of foot: Secondary | ICD-10-CM | POA: Diagnosis not present

## 2016-10-15 DIAGNOSIS — E1121 Type 2 diabetes mellitus with diabetic nephropathy: Secondary | ICD-10-CM | POA: Diagnosis not present

## 2016-10-15 DIAGNOSIS — Z6824 Body mass index (BMI) 24.0-24.9, adult: Secondary | ICD-10-CM | POA: Diagnosis not present

## 2016-10-15 DIAGNOSIS — Z992 Dependence on renal dialysis: Secondary | ICD-10-CM | POA: Diagnosis not present

## 2016-10-15 DIAGNOSIS — N186 End stage renal disease: Secondary | ICD-10-CM | POA: Diagnosis not present

## 2016-10-15 DIAGNOSIS — E1122 Type 2 diabetes mellitus with diabetic chronic kidney disease: Secondary | ICD-10-CM | POA: Diagnosis not present

## 2016-10-15 DIAGNOSIS — L97521 Non-pressure chronic ulcer of other part of left foot limited to breakdown of skin: Secondary | ICD-10-CM | POA: Diagnosis not present

## 2016-10-15 DIAGNOSIS — Z794 Long term (current) use of insulin: Secondary | ICD-10-CM | POA: Diagnosis not present

## 2016-10-15 DIAGNOSIS — B351 Tinea unguium: Secondary | ICD-10-CM | POA: Diagnosis not present

## 2016-10-15 DIAGNOSIS — Z89432 Acquired absence of left foot: Secondary | ICD-10-CM | POA: Diagnosis not present

## 2016-10-15 DIAGNOSIS — I120 Hypertensive chronic kidney disease with stage 5 chronic kidney disease or end stage renal disease: Secondary | ICD-10-CM | POA: Diagnosis not present

## 2016-10-16 DIAGNOSIS — N2581 Secondary hyperparathyroidism of renal origin: Secondary | ICD-10-CM | POA: Diagnosis not present

## 2016-10-16 DIAGNOSIS — D631 Anemia in chronic kidney disease: Secondary | ICD-10-CM | POA: Diagnosis not present

## 2016-10-16 DIAGNOSIS — E1122 Type 2 diabetes mellitus with diabetic chronic kidney disease: Secondary | ICD-10-CM | POA: Diagnosis not present

## 2016-10-16 DIAGNOSIS — Z992 Dependence on renal dialysis: Secondary | ICD-10-CM | POA: Diagnosis not present

## 2016-10-16 DIAGNOSIS — E876 Hypokalemia: Secondary | ICD-10-CM | POA: Diagnosis not present

## 2016-10-16 DIAGNOSIS — N186 End stage renal disease: Secondary | ICD-10-CM | POA: Diagnosis not present

## 2016-10-19 DIAGNOSIS — N186 End stage renal disease: Secondary | ICD-10-CM | POA: Diagnosis not present

## 2016-10-19 DIAGNOSIS — D631 Anemia in chronic kidney disease: Secondary | ICD-10-CM | POA: Diagnosis not present

## 2016-10-19 DIAGNOSIS — Z992 Dependence on renal dialysis: Secondary | ICD-10-CM | POA: Diagnosis not present

## 2016-10-19 DIAGNOSIS — E876 Hypokalemia: Secondary | ICD-10-CM | POA: Diagnosis not present

## 2016-10-19 DIAGNOSIS — E1122 Type 2 diabetes mellitus with diabetic chronic kidney disease: Secondary | ICD-10-CM | POA: Diagnosis not present

## 2016-10-19 DIAGNOSIS — N2581 Secondary hyperparathyroidism of renal origin: Secondary | ICD-10-CM | POA: Diagnosis not present

## 2016-10-21 DIAGNOSIS — Z992 Dependence on renal dialysis: Secondary | ICD-10-CM | POA: Diagnosis not present

## 2016-10-21 DIAGNOSIS — N186 End stage renal disease: Secondary | ICD-10-CM | POA: Diagnosis not present

## 2016-10-21 DIAGNOSIS — N2581 Secondary hyperparathyroidism of renal origin: Secondary | ICD-10-CM | POA: Diagnosis not present

## 2016-10-21 DIAGNOSIS — E876 Hypokalemia: Secondary | ICD-10-CM | POA: Diagnosis not present

## 2016-10-21 DIAGNOSIS — D631 Anemia in chronic kidney disease: Secondary | ICD-10-CM | POA: Diagnosis not present

## 2016-10-21 DIAGNOSIS — E1122 Type 2 diabetes mellitus with diabetic chronic kidney disease: Secondary | ICD-10-CM | POA: Diagnosis not present

## 2016-10-23 DIAGNOSIS — D631 Anemia in chronic kidney disease: Secondary | ICD-10-CM | POA: Diagnosis not present

## 2016-10-23 DIAGNOSIS — E876 Hypokalemia: Secondary | ICD-10-CM | POA: Diagnosis not present

## 2016-10-23 DIAGNOSIS — Z992 Dependence on renal dialysis: Secondary | ICD-10-CM | POA: Diagnosis not present

## 2016-10-23 DIAGNOSIS — N186 End stage renal disease: Secondary | ICD-10-CM | POA: Diagnosis not present

## 2016-10-23 DIAGNOSIS — N2581 Secondary hyperparathyroidism of renal origin: Secondary | ICD-10-CM | POA: Diagnosis not present

## 2016-10-23 DIAGNOSIS — E1122 Type 2 diabetes mellitus with diabetic chronic kidney disease: Secondary | ICD-10-CM | POA: Diagnosis not present

## 2016-10-24 DIAGNOSIS — N186 End stage renal disease: Secondary | ICD-10-CM | POA: Diagnosis not present

## 2016-10-24 DIAGNOSIS — Z992 Dependence on renal dialysis: Secondary | ICD-10-CM | POA: Diagnosis not present

## 2016-10-24 DIAGNOSIS — I159 Secondary hypertension, unspecified: Secondary | ICD-10-CM | POA: Diagnosis not present

## 2016-10-26 DIAGNOSIS — N186 End stage renal disease: Secondary | ICD-10-CM | POA: Diagnosis not present

## 2016-10-26 DIAGNOSIS — E1122 Type 2 diabetes mellitus with diabetic chronic kidney disease: Secondary | ICD-10-CM | POA: Diagnosis not present

## 2016-10-26 DIAGNOSIS — E876 Hypokalemia: Secondary | ICD-10-CM | POA: Diagnosis not present

## 2016-10-26 DIAGNOSIS — N2581 Secondary hyperparathyroidism of renal origin: Secondary | ICD-10-CM | POA: Diagnosis not present

## 2016-10-28 DIAGNOSIS — N2581 Secondary hyperparathyroidism of renal origin: Secondary | ICD-10-CM | POA: Diagnosis not present

## 2016-10-28 DIAGNOSIS — E1122 Type 2 diabetes mellitus with diabetic chronic kidney disease: Secondary | ICD-10-CM | POA: Diagnosis not present

## 2016-10-28 DIAGNOSIS — E876 Hypokalemia: Secondary | ICD-10-CM | POA: Diagnosis not present

## 2016-10-28 DIAGNOSIS — N186 End stage renal disease: Secondary | ICD-10-CM | POA: Diagnosis not present

## 2016-10-30 DIAGNOSIS — N186 End stage renal disease: Secondary | ICD-10-CM | POA: Diagnosis not present

## 2016-10-30 DIAGNOSIS — E876 Hypokalemia: Secondary | ICD-10-CM | POA: Diagnosis not present

## 2016-10-30 DIAGNOSIS — N2581 Secondary hyperparathyroidism of renal origin: Secondary | ICD-10-CM | POA: Diagnosis not present

## 2016-10-30 DIAGNOSIS — E1122 Type 2 diabetes mellitus with diabetic chronic kidney disease: Secondary | ICD-10-CM | POA: Diagnosis not present

## 2016-11-02 DIAGNOSIS — N2581 Secondary hyperparathyroidism of renal origin: Secondary | ICD-10-CM | POA: Diagnosis not present

## 2016-11-02 DIAGNOSIS — E1122 Type 2 diabetes mellitus with diabetic chronic kidney disease: Secondary | ICD-10-CM | POA: Diagnosis not present

## 2016-11-02 DIAGNOSIS — E876 Hypokalemia: Secondary | ICD-10-CM | POA: Diagnosis not present

## 2016-11-02 DIAGNOSIS — N186 End stage renal disease: Secondary | ICD-10-CM | POA: Diagnosis not present

## 2016-11-03 DIAGNOSIS — E1122 Type 2 diabetes mellitus with diabetic chronic kidney disease: Secondary | ICD-10-CM | POA: Diagnosis not present

## 2016-11-03 DIAGNOSIS — I70745 Atherosclerosis of other type of bypass graft(s) of the left leg with ulceration of other part of foot: Secondary | ICD-10-CM | POA: Diagnosis not present

## 2016-11-03 DIAGNOSIS — Z992 Dependence on renal dialysis: Secondary | ICD-10-CM | POA: Diagnosis not present

## 2016-11-03 DIAGNOSIS — I120 Hypertensive chronic kidney disease with stage 5 chronic kidney disease or end stage renal disease: Secondary | ICD-10-CM | POA: Diagnosis not present

## 2016-11-03 DIAGNOSIS — N186 End stage renal disease: Secondary | ICD-10-CM | POA: Diagnosis not present

## 2016-11-03 DIAGNOSIS — L97521 Non-pressure chronic ulcer of other part of left foot limited to breakdown of skin: Secondary | ICD-10-CM | POA: Diagnosis not present

## 2016-11-04 DIAGNOSIS — N186 End stage renal disease: Secondary | ICD-10-CM | POA: Diagnosis not present

## 2016-11-04 DIAGNOSIS — E876 Hypokalemia: Secondary | ICD-10-CM | POA: Diagnosis not present

## 2016-11-04 DIAGNOSIS — N2581 Secondary hyperparathyroidism of renal origin: Secondary | ICD-10-CM | POA: Diagnosis not present

## 2016-11-04 DIAGNOSIS — E1122 Type 2 diabetes mellitus with diabetic chronic kidney disease: Secondary | ICD-10-CM | POA: Diagnosis not present

## 2016-11-06 DIAGNOSIS — N186 End stage renal disease: Secondary | ICD-10-CM | POA: Diagnosis not present

## 2016-11-06 DIAGNOSIS — E876 Hypokalemia: Secondary | ICD-10-CM | POA: Diagnosis not present

## 2016-11-06 DIAGNOSIS — N2581 Secondary hyperparathyroidism of renal origin: Secondary | ICD-10-CM | POA: Diagnosis not present

## 2016-11-06 DIAGNOSIS — E1122 Type 2 diabetes mellitus with diabetic chronic kidney disease: Secondary | ICD-10-CM | POA: Diagnosis not present

## 2016-11-09 DIAGNOSIS — E876 Hypokalemia: Secondary | ICD-10-CM | POA: Diagnosis not present

## 2016-11-09 DIAGNOSIS — E1122 Type 2 diabetes mellitus with diabetic chronic kidney disease: Secondary | ICD-10-CM | POA: Diagnosis not present

## 2016-11-09 DIAGNOSIS — N2581 Secondary hyperparathyroidism of renal origin: Secondary | ICD-10-CM | POA: Diagnosis not present

## 2016-11-09 DIAGNOSIS — N186 End stage renal disease: Secondary | ICD-10-CM | POA: Diagnosis not present

## 2016-11-10 ENCOUNTER — Ambulatory Visit (INDEPENDENT_AMBULATORY_CARE_PROVIDER_SITE_OTHER): Payer: Medicare Other | Admitting: Orthopedic Surgery

## 2016-11-10 DIAGNOSIS — Z89432 Acquired absence of left foot: Secondary | ICD-10-CM

## 2016-11-10 NOTE — Progress Notes (Signed)
Office Visit Note   Patient: Destiny Day           Date of Birth: April 08, 1951           MRN: [id number] Visit Date: 11/10/2016              Requested by: Velna Hatchet, MD [place], [place] [address] PCP: Velna Hatchet, MD  No chief complaint on file.     HPI: Patient is a 66 year old woman who is 2 months status post left transmetatarsal amputation. Patient currently has extra-depth shoes custom orthotics with spacer provided by [facility]. She is currently ambulating with a walker she has venous stasis changes well and wants medical compression stockings.  Assessment & Plan: Visit Diagnoses:  1. S/P transmetatarsal amputation of foot, left ([place])     Plan: Patient is given a prescription for knee-high 15-20 mm compression stockings at [facility]. She was given instructions for heel cord stretching and this was demonstrated to her.  Follow-Up Instructions: Return in about 3 months (around 02/09/2017).   Ortho Exam  Patient is alert, oriented, no adenopathy, well-dressed, normal affect, normal respiratory effort. Examination there is brawny skin color changes there is decreased edema in her leg there is no cellulitis no drainage the incision is well-healed. Her dorsiflexion of the ankle is just past neutral with her knee extended.  Imaging: No results found.  Labs: Lab Results  Component Value Date   HGBA1C 7.5 (H) 06/24/2016   HGBA1C 9.8 (H) 10/09/2015   HGBA1C 11.3 (H) 06/13/2015   REPTSTATUS 01/16/2014 FINAL 01/10/2014   CULT  01/10/2014    NO GROWTH 5 DAYS Performed at [facility] 11/29/2008    Orders:  No orders of the defined types were placed in this encounter.  No orders of the defined types were placed in this encounter.    Procedures: No procedures performed  Clinical Data: No additional findings.  ROS:  All other systems negative, except as noted in the HPI. Review of  Systems  Objective: Vital Signs: There were no vitals taken for this visit.  Specialty Comments:  No specialty comments available.  PMFS History: Patient Active Problem List   Diagnosis Date Noted  . Idiopathic chronic venous hypertension of both lower extremities with inflammation 10/13/2016  . S/P transmetatarsal amputation of foot, left ([place]) 09/21/2016  . Onychomycosis 08/15/2016  . Gangrene of toe of left foot ([place])   . PAD (peripheral artery disease) ([place]) 07/08/2016  . Atherosclerosis of native artery of left lower extremity with gangrene ([place]) 06/23/2016  . GERD (gastroesophageal reflux disease) 10/07/2015  . ARF (acute renal failure) ([place])   . Hypothermia 06/12/2015  . Acute on chronic renal failure ([place]) 06/12/2015  . CHF (congestive heart failure) ([place]) 07/30/2014  . CKD (chronic kidney disease) stage 4, GFR 15-29 ml/min (HCC) 06/27/2014  . Hypertensive heart disease 05/25/2013  . CKD (chronic kidney disease) stage 3, GFR 30-59 ml/min 05/25/2013  . Pulmonary edema 05/23/2013  . Type 2 diabetes mellitus with diabetic nephropathy ([place]) 05/23/2013  . Type 2 diabetes mellitus with hyperosmolar nonketotic hyperglycemia ([name]) 04/13/2013  . Chronic diastolic CHF (congestive heart failure) ([place]) 04/13/2013  . UGI bleed 03/31/2013  . Hypokalemia 08/24/2012  . Type II or unspecified type diabetes mellitus without mention of complication, not stated as uncontrolled 12/27/2007  . HLD (hyperlipidemia) 12/27/2007  . ALLERGIC RHINITIS 12/27/2007  . Asthma 12/27/2007   Past Medical History:  Diagnosis Date  .  Abdominal bruit   . Anemia   . Anxiety   . Arthritis    Osteoarthritis  . Asthma   . Cervical disc disease    "pinced nerve"  . CHF (congestive heart failure) ([place])   . Complication of anesthesia    " after I got home from my last procedure, I started itching."  . Diabetes mellitus    insulin dependent  . Diverticulitis   . ESRD on peritoneal dialysis [facility])     Hemodialysis - MWF  . GERD (gastroesophageal reflux disease)    from medications  . GI bleed 03/31/2013  . History of hiatal hernia   . Hyperlipidemia   . Hypertension   . Osteoporosis   . Pneumonia   . Seasonal allergies   . Shortness of breath dyspnea    WIth exertion  . Sleep apnea    can't afford cpap    Family History  Problem Relation Age of Onset  . Other Mother     not sure of cause of death  . Diabetes Father   . Pancreatic cancer Maternal Grandmother   . Colon cancer Neg Hx     Past Surgical History:  Procedure Laterality Date  . A/V SHUNTOGRAM N/A 09/22/2016   Procedure: A/V Shuntogram - left arm;  Surgeon: Serafina Mitchell, MD;  Location: [facility];  Service: Cardiovascular;  Laterality: N/A;  . ABDOMINAL HYSTERECTOMY  1993`  . AMPUTATION Left 09/01/2016   Procedure: LEFT FOOT TRANSMETATARSAL AMPUTATION;  Surgeon: Newt Minion, MD;  Location: [facility];  Service: Orthopedics;  Laterality: Left;  . AV FISTULA PLACEMENT Left 04/21/2016   Procedure: INSERTION OF ARTERIOVENOUS (AV) GORE-TEX GRAFT ARM LEFT;  Surgeon: Elam Dutch, MD;  Location: [facility];  Service: Vascular;  Laterality: Left;  . [place] TRANSPOSITION Left 07/10/2014   Procedure: BASCILIC VEIN TRANSPOSITION;  Surgeon: Angelia Mould, MD;  Location: [facility];  Service: Vascular;  Laterality: Left;  . BASCILIC VEIN TRANSPOSITION Right 11/08/2014   Procedure: FIRST STAGE BASILIC VEIN TRANSPOSITION;  Surgeon: Angelia Mould, MD;  Location: [facility];  Service: Vascular;  Laterality: Right;  . BASCILIC VEIN TRANSPOSITION Right 01/18/2015   Procedure: SECOND STAGE BASILIC VEIN TRANSPOSITION;  Surgeon: Angelia Mould, MD;  [facility];  Service: Vascular;  Laterality: Right;  . ESOPHAGOGASTRODUODENOSCOPY N/A 03/31/2013   Procedure: ESOPHAGOGASTRODUODENOSCOPY (EGD);  Surgeon: Gatha Mayer, MD;  Location: [facility] ENDOSCOPY;  Service: Endoscopy;  Laterality: N/A;  . EYE SURGERY     laser  surgery  . FEMORAL-POPLITEAL BYPASS GRAFT Left 07/08/2016   Procedure: LEFT  FEMORAL-BELOW KNEE POPLITEAL ARTERY BYPASS GRAFT USING 6MM X 80 CM PROPATEN GORETEX GRAFT WITH RINGS.;  Surgeon: Rosetta Posner, MD;  Location: [facility];  Service: Vascular;  Laterality: Left;  . FISTULOGRAM Left 10/29/2014   Procedure: FISTULOGRAM;  Surgeon: Angelia Mould, MD;  Location: [facility] CATH LAB;  Service: Cardiovascular;  Laterality: Left;  . LIGATION OF ARTERIOVENOUS  FISTULA Left 04/21/2016   Procedure: LIGATION OF ARTERIOVENOUS  FISTULA LEFT ARM;  Surgeon: Elam Dutch, MD;  Location: [facility];  Service: Vascular;  Laterality: Left;  . PATCH ANGIOPLASTY Right 01/18/2015   Procedure: BASILIC VEIN PATCH ANGIOPLASTY USING VASCUGUARD PATCH;  Surgeon: Angelia Mould, MD;  Location: [place];  Service: Vascular;  Laterality: Right;  . PERIPHERAL VASCULAR BALLOON ANGIOPLASTY  09/22/2016   Procedure: Peripheral Vascular Balloon Angioplasty;  Surgeon: Serafina Mitchell, MD;  Location: [place] [facility] LAB;  Service:  Cardiovascular;;  Lt. Fistula  . PERIPHERAL VASCULAR CATHETERIZATION N/A 06/23/2016   Procedure: Abdominal Aortogram w/Lower Extremity;  Surgeon: Serafina Mitchell, MD;  Location: [place] [facility] LAB;  Service: Cardiovascular;  Laterality: N/A;  . PERIPHERAL VASCULAR CATHETERIZATION  06/23/2016   Procedure: Peripheral Vascular Intervention;  Surgeon: Serafina Mitchell, MD;  Location: [place] CV LAB;  Service: Cardiovascular;;  lt common and external illiac artery   Social History   Occupational History  . Retired    Social History Main Topics  . Smoking status: Former Smoker    Packs/day: 0.35    Years: 40.00    Types: Cigarettes    Quit date: 05/10/2012  . Smokeless tobacco: Never Used  . Alcohol use No  . Drug use: Yes     Comment: marijuana; quit in early 1980's  . Sexual activity: Yes

## 2016-11-11 DIAGNOSIS — E876 Hypokalemia: Secondary | ICD-10-CM | POA: Diagnosis not present

## 2016-11-11 DIAGNOSIS — N2581 Secondary hyperparathyroidism of renal origin: Secondary | ICD-10-CM | POA: Diagnosis not present

## 2016-11-11 DIAGNOSIS — E1122 Type 2 diabetes mellitus with diabetic chronic kidney disease: Secondary | ICD-10-CM | POA: Diagnosis not present

## 2016-11-11 DIAGNOSIS — N186 End stage renal disease: Secondary | ICD-10-CM | POA: Diagnosis not present

## 2016-11-12 DIAGNOSIS — L97521 Non-pressure chronic ulcer of other part of left foot limited to breakdown of skin: Secondary | ICD-10-CM | POA: Diagnosis not present

## 2016-11-12 DIAGNOSIS — Z992 Dependence on renal dialysis: Secondary | ICD-10-CM | POA: Diagnosis not present

## 2016-11-12 DIAGNOSIS — I120 Hypertensive chronic kidney disease with stage 5 chronic kidney disease or end stage renal disease: Secondary | ICD-10-CM | POA: Diagnosis not present

## 2016-11-12 DIAGNOSIS — E1122 Type 2 diabetes mellitus with diabetic chronic kidney disease: Secondary | ICD-10-CM | POA: Diagnosis not present

## 2016-11-12 DIAGNOSIS — I70745 Atherosclerosis of other type of bypass graft(s) of the left leg with ulceration of other part of foot: Secondary | ICD-10-CM | POA: Diagnosis not present

## 2016-11-12 DIAGNOSIS — N186 End stage renal disease: Secondary | ICD-10-CM | POA: Diagnosis not present

## 2016-11-13 DIAGNOSIS — E1122 Type 2 diabetes mellitus with diabetic chronic kidney disease: Secondary | ICD-10-CM | POA: Diagnosis not present

## 2016-11-13 DIAGNOSIS — E876 Hypokalemia: Secondary | ICD-10-CM | POA: Diagnosis not present

## 2016-11-13 DIAGNOSIS — N186 End stage renal disease: Secondary | ICD-10-CM | POA: Diagnosis not present

## 2016-11-13 DIAGNOSIS — N2581 Secondary hyperparathyroidism of renal origin: Secondary | ICD-10-CM | POA: Diagnosis not present

## 2016-11-16 DIAGNOSIS — E1122 Type 2 diabetes mellitus with diabetic chronic kidney disease: Secondary | ICD-10-CM | POA: Diagnosis not present

## 2016-11-16 DIAGNOSIS — N186 End stage renal disease: Secondary | ICD-10-CM | POA: Diagnosis not present

## 2016-11-16 DIAGNOSIS — N2581 Secondary hyperparathyroidism of renal origin: Secondary | ICD-10-CM | POA: Diagnosis not present

## 2016-11-16 DIAGNOSIS — E876 Hypokalemia: Secondary | ICD-10-CM | POA: Diagnosis not present

## 2016-11-18 DIAGNOSIS — E1122 Type 2 diabetes mellitus with diabetic chronic kidney disease: Secondary | ICD-10-CM | POA: Diagnosis not present

## 2016-11-18 DIAGNOSIS — N2581 Secondary hyperparathyroidism of renal origin: Secondary | ICD-10-CM | POA: Diagnosis not present

## 2016-11-18 DIAGNOSIS — N186 End stage renal disease: Secondary | ICD-10-CM | POA: Diagnosis not present

## 2016-11-18 DIAGNOSIS — E876 Hypokalemia: Secondary | ICD-10-CM | POA: Diagnosis not present

## 2016-11-19 DIAGNOSIS — L97521 Non-pressure chronic ulcer of other part of left foot limited to breakdown of skin: Secondary | ICD-10-CM | POA: Diagnosis not present

## 2016-11-19 DIAGNOSIS — E1122 Type 2 diabetes mellitus with diabetic chronic kidney disease: Secondary | ICD-10-CM | POA: Diagnosis not present

## 2016-11-19 DIAGNOSIS — I120 Hypertensive chronic kidney disease with stage 5 chronic kidney disease or end stage renal disease: Secondary | ICD-10-CM | POA: Diagnosis not present

## 2016-11-19 DIAGNOSIS — I70745 Atherosclerosis of other type of bypass graft(s) of the left leg with ulceration of other part of foot: Secondary | ICD-10-CM | POA: Diagnosis not present

## 2016-11-19 DIAGNOSIS — N186 End stage renal disease: Secondary | ICD-10-CM | POA: Diagnosis not present

## 2016-11-19 DIAGNOSIS — Z992 Dependence on renal dialysis: Secondary | ICD-10-CM | POA: Diagnosis not present

## 2016-11-20 DIAGNOSIS — N186 End stage renal disease: Secondary | ICD-10-CM | POA: Diagnosis not present

## 2016-11-20 DIAGNOSIS — E1122 Type 2 diabetes mellitus with diabetic chronic kidney disease: Secondary | ICD-10-CM | POA: Diagnosis not present

## 2016-11-20 DIAGNOSIS — E876 Hypokalemia: Secondary | ICD-10-CM | POA: Diagnosis not present

## 2016-11-20 DIAGNOSIS — N2581 Secondary hyperparathyroidism of renal origin: Secondary | ICD-10-CM | POA: Diagnosis not present

## 2016-11-23 DIAGNOSIS — E876 Hypokalemia: Secondary | ICD-10-CM | POA: Diagnosis not present

## 2016-11-23 DIAGNOSIS — I159 Secondary hypertension, unspecified: Secondary | ICD-10-CM | POA: Diagnosis not present

## 2016-11-23 DIAGNOSIS — Z992 Dependence on renal dialysis: Secondary | ICD-10-CM | POA: Diagnosis not present

## 2016-11-23 DIAGNOSIS — E1122 Type 2 diabetes mellitus with diabetic chronic kidney disease: Secondary | ICD-10-CM | POA: Diagnosis not present

## 2016-11-23 DIAGNOSIS — N186 End stage renal disease: Secondary | ICD-10-CM | POA: Diagnosis not present

## 2016-11-23 DIAGNOSIS — N2581 Secondary hyperparathyroidism of renal origin: Secondary | ICD-10-CM | POA: Diagnosis not present

## 2016-11-25 DIAGNOSIS — D509 Iron deficiency anemia, unspecified: Secondary | ICD-10-CM | POA: Diagnosis not present

## 2016-11-25 DIAGNOSIS — E876 Hypokalemia: Secondary | ICD-10-CM | POA: Diagnosis not present

## 2016-11-25 DIAGNOSIS — N186 End stage renal disease: Secondary | ICD-10-CM | POA: Diagnosis not present

## 2016-11-25 DIAGNOSIS — N2581 Secondary hyperparathyroidism of renal origin: Secondary | ICD-10-CM | POA: Diagnosis not present

## 2016-11-25 DIAGNOSIS — D631 Anemia in chronic kidney disease: Secondary | ICD-10-CM | POA: Diagnosis not present

## 2016-11-25 DIAGNOSIS — E1122 Type 2 diabetes mellitus with diabetic chronic kidney disease: Secondary | ICD-10-CM | POA: Diagnosis not present

## 2016-11-27 DIAGNOSIS — D509 Iron deficiency anemia, unspecified: Secondary | ICD-10-CM | POA: Diagnosis not present

## 2016-11-27 DIAGNOSIS — E876 Hypokalemia: Secondary | ICD-10-CM | POA: Diagnosis not present

## 2016-11-27 DIAGNOSIS — N186 End stage renal disease: Secondary | ICD-10-CM | POA: Diagnosis not present

## 2016-11-27 DIAGNOSIS — E1122 Type 2 diabetes mellitus with diabetic chronic kidney disease: Secondary | ICD-10-CM | POA: Diagnosis not present

## 2016-11-27 DIAGNOSIS — D631 Anemia in chronic kidney disease: Secondary | ICD-10-CM | POA: Diagnosis not present

## 2016-11-27 DIAGNOSIS — N2581 Secondary hyperparathyroidism of renal origin: Secondary | ICD-10-CM | POA: Diagnosis not present

## 2016-11-30 DIAGNOSIS — N186 End stage renal disease: Secondary | ICD-10-CM | POA: Diagnosis not present

## 2016-11-30 DIAGNOSIS — N2581 Secondary hyperparathyroidism of renal origin: Secondary | ICD-10-CM | POA: Diagnosis not present

## 2016-11-30 DIAGNOSIS — E1122 Type 2 diabetes mellitus with diabetic chronic kidney disease: Secondary | ICD-10-CM | POA: Diagnosis not present

## 2016-11-30 DIAGNOSIS — D509 Iron deficiency anemia, unspecified: Secondary | ICD-10-CM | POA: Diagnosis not present

## 2016-11-30 DIAGNOSIS — D631 Anemia in chronic kidney disease: Secondary | ICD-10-CM | POA: Diagnosis not present

## 2016-11-30 DIAGNOSIS — E876 Hypokalemia: Secondary | ICD-10-CM | POA: Diagnosis not present

## 2016-12-02 DIAGNOSIS — N2581 Secondary hyperparathyroidism of renal origin: Secondary | ICD-10-CM | POA: Diagnosis not present

## 2016-12-02 DIAGNOSIS — N186 End stage renal disease: Secondary | ICD-10-CM | POA: Diagnosis not present

## 2016-12-02 DIAGNOSIS — E876 Hypokalemia: Secondary | ICD-10-CM | POA: Diagnosis not present

## 2016-12-02 DIAGNOSIS — E1122 Type 2 diabetes mellitus with diabetic chronic kidney disease: Secondary | ICD-10-CM | POA: Diagnosis not present

## 2016-12-02 DIAGNOSIS — D631 Anemia in chronic kidney disease: Secondary | ICD-10-CM | POA: Diagnosis not present

## 2016-12-02 DIAGNOSIS — D509 Iron deficiency anemia, unspecified: Secondary | ICD-10-CM | POA: Diagnosis not present

## 2016-12-04 DIAGNOSIS — N2581 Secondary hyperparathyroidism of renal origin: Secondary | ICD-10-CM | POA: Diagnosis not present

## 2016-12-04 DIAGNOSIS — E876 Hypokalemia: Secondary | ICD-10-CM | POA: Diagnosis not present

## 2016-12-04 DIAGNOSIS — N186 End stage renal disease: Secondary | ICD-10-CM | POA: Diagnosis not present

## 2016-12-04 DIAGNOSIS — E1122 Type 2 diabetes mellitus with diabetic chronic kidney disease: Secondary | ICD-10-CM | POA: Diagnosis not present

## 2016-12-04 DIAGNOSIS — D631 Anemia in chronic kidney disease: Secondary | ICD-10-CM | POA: Diagnosis not present

## 2016-12-04 DIAGNOSIS — D509 Iron deficiency anemia, unspecified: Secondary | ICD-10-CM | POA: Diagnosis not present

## 2016-12-07 DIAGNOSIS — E876 Hypokalemia: Secondary | ICD-10-CM | POA: Diagnosis not present

## 2016-12-07 DIAGNOSIS — D509 Iron deficiency anemia, unspecified: Secondary | ICD-10-CM | POA: Diagnosis not present

## 2016-12-07 DIAGNOSIS — N2581 Secondary hyperparathyroidism of renal origin: Secondary | ICD-10-CM | POA: Diagnosis not present

## 2016-12-07 DIAGNOSIS — N186 End stage renal disease: Secondary | ICD-10-CM | POA: Diagnosis not present

## 2016-12-07 DIAGNOSIS — D631 Anemia in chronic kidney disease: Secondary | ICD-10-CM | POA: Diagnosis not present

## 2016-12-07 DIAGNOSIS — E1122 Type 2 diabetes mellitus with diabetic chronic kidney disease: Secondary | ICD-10-CM | POA: Diagnosis not present

## 2016-12-09 DIAGNOSIS — D509 Iron deficiency anemia, unspecified: Secondary | ICD-10-CM | POA: Diagnosis not present

## 2016-12-09 DIAGNOSIS — N186 End stage renal disease: Secondary | ICD-10-CM | POA: Diagnosis not present

## 2016-12-09 DIAGNOSIS — E1122 Type 2 diabetes mellitus with diabetic chronic kidney disease: Secondary | ICD-10-CM | POA: Diagnosis not present

## 2016-12-09 DIAGNOSIS — E876 Hypokalemia: Secondary | ICD-10-CM | POA: Diagnosis not present

## 2016-12-09 DIAGNOSIS — N2581 Secondary hyperparathyroidism of renal origin: Secondary | ICD-10-CM | POA: Diagnosis not present

## 2016-12-09 DIAGNOSIS — D631 Anemia in chronic kidney disease: Secondary | ICD-10-CM | POA: Diagnosis not present

## 2016-12-11 DIAGNOSIS — D631 Anemia in chronic kidney disease: Secondary | ICD-10-CM | POA: Diagnosis not present

## 2016-12-11 DIAGNOSIS — N186 End stage renal disease: Secondary | ICD-10-CM | POA: Diagnosis not present

## 2016-12-11 DIAGNOSIS — D509 Iron deficiency anemia, unspecified: Secondary | ICD-10-CM | POA: Diagnosis not present

## 2016-12-11 DIAGNOSIS — N2581 Secondary hyperparathyroidism of renal origin: Secondary | ICD-10-CM | POA: Diagnosis not present

## 2016-12-11 DIAGNOSIS — E1122 Type 2 diabetes mellitus with diabetic chronic kidney disease: Secondary | ICD-10-CM | POA: Diagnosis not present

## 2016-12-11 DIAGNOSIS — E876 Hypokalemia: Secondary | ICD-10-CM | POA: Diagnosis not present

## 2016-12-14 DIAGNOSIS — E1122 Type 2 diabetes mellitus with diabetic chronic kidney disease: Secondary | ICD-10-CM | POA: Diagnosis not present

## 2016-12-14 DIAGNOSIS — D509 Iron deficiency anemia, unspecified: Secondary | ICD-10-CM | POA: Diagnosis not present

## 2016-12-14 DIAGNOSIS — E876 Hypokalemia: Secondary | ICD-10-CM | POA: Diagnosis not present

## 2016-12-14 DIAGNOSIS — N186 End stage renal disease: Secondary | ICD-10-CM | POA: Diagnosis not present

## 2016-12-14 DIAGNOSIS — N2581 Secondary hyperparathyroidism of renal origin: Secondary | ICD-10-CM | POA: Diagnosis not present

## 2016-12-14 DIAGNOSIS — D631 Anemia in chronic kidney disease: Secondary | ICD-10-CM | POA: Diagnosis not present

## 2016-12-16 DIAGNOSIS — D509 Iron deficiency anemia, unspecified: Secondary | ICD-10-CM | POA: Diagnosis not present

## 2016-12-16 DIAGNOSIS — E876 Hypokalemia: Secondary | ICD-10-CM | POA: Diagnosis not present

## 2016-12-16 DIAGNOSIS — N186 End stage renal disease: Secondary | ICD-10-CM | POA: Diagnosis not present

## 2016-12-16 DIAGNOSIS — D631 Anemia in chronic kidney disease: Secondary | ICD-10-CM | POA: Diagnosis not present

## 2016-12-16 DIAGNOSIS — E1122 Type 2 diabetes mellitus with diabetic chronic kidney disease: Secondary | ICD-10-CM | POA: Diagnosis not present

## 2016-12-16 DIAGNOSIS — N2581 Secondary hyperparathyroidism of renal origin: Secondary | ICD-10-CM | POA: Diagnosis not present

## 2016-12-18 DIAGNOSIS — E1122 Type 2 diabetes mellitus with diabetic chronic kidney disease: Secondary | ICD-10-CM | POA: Diagnosis not present

## 2016-12-18 DIAGNOSIS — N186 End stage renal disease: Secondary | ICD-10-CM | POA: Diagnosis not present

## 2016-12-18 DIAGNOSIS — D509 Iron deficiency anemia, unspecified: Secondary | ICD-10-CM | POA: Diagnosis not present

## 2016-12-18 DIAGNOSIS — E876 Hypokalemia: Secondary | ICD-10-CM | POA: Diagnosis not present

## 2016-12-18 DIAGNOSIS — N2581 Secondary hyperparathyroidism of renal origin: Secondary | ICD-10-CM | POA: Diagnosis not present

## 2016-12-18 DIAGNOSIS — D631 Anemia in chronic kidney disease: Secondary | ICD-10-CM | POA: Diagnosis not present

## 2016-12-21 DIAGNOSIS — E1122 Type 2 diabetes mellitus with diabetic chronic kidney disease: Secondary | ICD-10-CM | POA: Diagnosis not present

## 2016-12-21 DIAGNOSIS — D631 Anemia in chronic kidney disease: Secondary | ICD-10-CM | POA: Diagnosis not present

## 2016-12-21 DIAGNOSIS — E876 Hypokalemia: Secondary | ICD-10-CM | POA: Diagnosis not present

## 2016-12-21 DIAGNOSIS — N2581 Secondary hyperparathyroidism of renal origin: Secondary | ICD-10-CM | POA: Diagnosis not present

## 2016-12-21 DIAGNOSIS — N186 End stage renal disease: Secondary | ICD-10-CM | POA: Diagnosis not present

## 2016-12-21 DIAGNOSIS — D509 Iron deficiency anemia, unspecified: Secondary | ICD-10-CM | POA: Diagnosis not present

## 2016-12-23 DIAGNOSIS — E1122 Type 2 diabetes mellitus with diabetic chronic kidney disease: Secondary | ICD-10-CM | POA: Diagnosis not present

## 2016-12-23 DIAGNOSIS — E876 Hypokalemia: Secondary | ICD-10-CM | POA: Diagnosis not present

## 2016-12-23 DIAGNOSIS — D631 Anemia in chronic kidney disease: Secondary | ICD-10-CM | POA: Diagnosis not present

## 2016-12-23 DIAGNOSIS — N2581 Secondary hyperparathyroidism of renal origin: Secondary | ICD-10-CM | POA: Diagnosis not present

## 2016-12-23 DIAGNOSIS — N186 End stage renal disease: Secondary | ICD-10-CM | POA: Diagnosis not present

## 2016-12-23 DIAGNOSIS — D509 Iron deficiency anemia, unspecified: Secondary | ICD-10-CM | POA: Diagnosis not present

## 2016-12-24 DIAGNOSIS — Z992 Dependence on renal dialysis: Secondary | ICD-10-CM | POA: Diagnosis not present

## 2016-12-24 DIAGNOSIS — I159 Secondary hypertension, unspecified: Secondary | ICD-10-CM | POA: Diagnosis not present

## 2016-12-24 DIAGNOSIS — N186 End stage renal disease: Secondary | ICD-10-CM | POA: Diagnosis not present

## 2016-12-25 DIAGNOSIS — E876 Hypokalemia: Secondary | ICD-10-CM | POA: Diagnosis not present

## 2016-12-25 DIAGNOSIS — N2581 Secondary hyperparathyroidism of renal origin: Secondary | ICD-10-CM | POA: Diagnosis not present

## 2016-12-25 DIAGNOSIS — N186 End stage renal disease: Secondary | ICD-10-CM | POA: Diagnosis not present

## 2016-12-25 DIAGNOSIS — D631 Anemia in chronic kidney disease: Secondary | ICD-10-CM | POA: Diagnosis not present

## 2016-12-25 DIAGNOSIS — E1122 Type 2 diabetes mellitus with diabetic chronic kidney disease: Secondary | ICD-10-CM | POA: Diagnosis not present

## 2016-12-25 DIAGNOSIS — D509 Iron deficiency anemia, unspecified: Secondary | ICD-10-CM | POA: Diagnosis not present

## 2016-12-26 NOTE — Addendum Note (Signed)
Addendum  created 12/26/16 0911 by Duane [place], MD   Sign clinical note

## 2016-12-28 DIAGNOSIS — D509 Iron deficiency anemia, unspecified: Secondary | ICD-10-CM | POA: Diagnosis not present

## 2016-12-28 DIAGNOSIS — E1122 Type 2 diabetes mellitus with diabetic chronic kidney disease: Secondary | ICD-10-CM | POA: Diagnosis not present

## 2016-12-28 DIAGNOSIS — N186 End stage renal disease: Secondary | ICD-10-CM | POA: Diagnosis not present

## 2016-12-28 DIAGNOSIS — N2581 Secondary hyperparathyroidism of renal origin: Secondary | ICD-10-CM | POA: Diagnosis not present

## 2016-12-28 DIAGNOSIS — D631 Anemia in chronic kidney disease: Secondary | ICD-10-CM | POA: Diagnosis not present

## 2016-12-28 DIAGNOSIS — E876 Hypokalemia: Secondary | ICD-10-CM | POA: Diagnosis not present

## 2016-12-30 DIAGNOSIS — E876 Hypokalemia: Secondary | ICD-10-CM | POA: Diagnosis not present

## 2016-12-30 DIAGNOSIS — D509 Iron deficiency anemia, unspecified: Secondary | ICD-10-CM | POA: Diagnosis not present

## 2016-12-30 DIAGNOSIS — N186 End stage renal disease: Secondary | ICD-10-CM | POA: Diagnosis not present

## 2016-12-30 DIAGNOSIS — D631 Anemia in chronic kidney disease: Secondary | ICD-10-CM | POA: Diagnosis not present

## 2016-12-30 DIAGNOSIS — E1122 Type 2 diabetes mellitus with diabetic chronic kidney disease: Secondary | ICD-10-CM | POA: Diagnosis not present

## 2016-12-30 DIAGNOSIS — N2581 Secondary hyperparathyroidism of renal origin: Secondary | ICD-10-CM | POA: Diagnosis not present

## 2017-01-01 DIAGNOSIS — D509 Iron deficiency anemia, unspecified: Secondary | ICD-10-CM | POA: Diagnosis not present

## 2017-01-01 DIAGNOSIS — N2581 Secondary hyperparathyroidism of renal origin: Secondary | ICD-10-CM | POA: Diagnosis not present

## 2017-01-01 DIAGNOSIS — N186 End stage renal disease: Secondary | ICD-10-CM | POA: Diagnosis not present

## 2017-01-01 DIAGNOSIS — E876 Hypokalemia: Secondary | ICD-10-CM | POA: Diagnosis not present

## 2017-01-01 DIAGNOSIS — D631 Anemia in chronic kidney disease: Secondary | ICD-10-CM | POA: Diagnosis not present

## 2017-01-01 DIAGNOSIS — E1122 Type 2 diabetes mellitus with diabetic chronic kidney disease: Secondary | ICD-10-CM | POA: Diagnosis not present

## 2017-01-04 DIAGNOSIS — E876 Hypokalemia: Secondary | ICD-10-CM | POA: Diagnosis not present

## 2017-01-04 DIAGNOSIS — N2581 Secondary hyperparathyroidism of renal origin: Secondary | ICD-10-CM | POA: Diagnosis not present

## 2017-01-04 DIAGNOSIS — D509 Iron deficiency anemia, unspecified: Secondary | ICD-10-CM | POA: Diagnosis not present

## 2017-01-04 DIAGNOSIS — N186 End stage renal disease: Secondary | ICD-10-CM | POA: Diagnosis not present

## 2017-01-04 DIAGNOSIS — E1122 Type 2 diabetes mellitus with diabetic chronic kidney disease: Secondary | ICD-10-CM | POA: Diagnosis not present

## 2017-01-04 DIAGNOSIS — D631 Anemia in chronic kidney disease: Secondary | ICD-10-CM | POA: Diagnosis not present

## 2017-01-06 DIAGNOSIS — D509 Iron deficiency anemia, unspecified: Secondary | ICD-10-CM | POA: Diagnosis not present

## 2017-01-06 DIAGNOSIS — E1122 Type 2 diabetes mellitus with diabetic chronic kidney disease: Secondary | ICD-10-CM | POA: Diagnosis not present

## 2017-01-06 DIAGNOSIS — D631 Anemia in chronic kidney disease: Secondary | ICD-10-CM | POA: Diagnosis not present

## 2017-01-06 DIAGNOSIS — N2581 Secondary hyperparathyroidism of renal origin: Secondary | ICD-10-CM | POA: Diagnosis not present

## 2017-01-06 DIAGNOSIS — E876 Hypokalemia: Secondary | ICD-10-CM | POA: Diagnosis not present

## 2017-01-06 DIAGNOSIS — N186 End stage renal disease: Secondary | ICD-10-CM | POA: Diagnosis not present

## 2017-01-06 DIAGNOSIS — Z992 Dependence on renal dialysis: Secondary | ICD-10-CM | POA: Insufficient documentation

## 2017-01-08 DIAGNOSIS — N2581 Secondary hyperparathyroidism of renal origin: Secondary | ICD-10-CM | POA: Diagnosis not present

## 2017-01-08 DIAGNOSIS — E876 Hypokalemia: Secondary | ICD-10-CM | POA: Diagnosis not present

## 2017-01-08 DIAGNOSIS — D509 Iron deficiency anemia, unspecified: Secondary | ICD-10-CM | POA: Diagnosis not present

## 2017-01-08 DIAGNOSIS — N186 End stage renal disease: Secondary | ICD-10-CM | POA: Diagnosis not present

## 2017-01-08 DIAGNOSIS — D631 Anemia in chronic kidney disease: Secondary | ICD-10-CM | POA: Diagnosis not present

## 2017-01-08 DIAGNOSIS — E1122 Type 2 diabetes mellitus with diabetic chronic kidney disease: Secondary | ICD-10-CM | POA: Diagnosis not present

## 2017-01-11 DIAGNOSIS — N2581 Secondary hyperparathyroidism of renal origin: Secondary | ICD-10-CM | POA: Diagnosis not present

## 2017-01-11 DIAGNOSIS — D509 Iron deficiency anemia, unspecified: Secondary | ICD-10-CM | POA: Diagnosis not present

## 2017-01-11 DIAGNOSIS — E876 Hypokalemia: Secondary | ICD-10-CM | POA: Diagnosis not present

## 2017-01-11 DIAGNOSIS — E1122 Type 2 diabetes mellitus with diabetic chronic kidney disease: Secondary | ICD-10-CM | POA: Diagnosis not present

## 2017-01-11 DIAGNOSIS — D631 Anemia in chronic kidney disease: Secondary | ICD-10-CM | POA: Diagnosis not present

## 2017-01-11 DIAGNOSIS — N186 End stage renal disease: Secondary | ICD-10-CM | POA: Diagnosis not present

## 2017-01-12 DIAGNOSIS — M859 Disorder of bone density and structure, unspecified: Secondary | ICD-10-CM | POA: Diagnosis not present

## 2017-01-12 DIAGNOSIS — M8580 Other specified disorders of bone density and structure, unspecified site: Secondary | ICD-10-CM | POA: Diagnosis not present

## 2017-01-12 DIAGNOSIS — E784 Other hyperlipidemia: Secondary | ICD-10-CM | POA: Diagnosis not present

## 2017-01-12 DIAGNOSIS — E119 Type 2 diabetes mellitus without complications: Secondary | ICD-10-CM | POA: Diagnosis not present

## 2017-01-12 DIAGNOSIS — E559 Vitamin D deficiency, unspecified: Secondary | ICD-10-CM | POA: Diagnosis not present

## 2017-01-12 DIAGNOSIS — E1129 Type 2 diabetes mellitus with other diabetic kidney complication: Secondary | ICD-10-CM | POA: Diagnosis not present

## 2017-01-12 DIAGNOSIS — N186 End stage renal disease: Secondary | ICD-10-CM | POA: Diagnosis not present

## 2017-01-12 DIAGNOSIS — Z0000 Encounter for general adult medical examination without abnormal findings: Secondary | ICD-10-CM | POA: Diagnosis not present

## 2017-01-13 DIAGNOSIS — E876 Hypokalemia: Secondary | ICD-10-CM | POA: Diagnosis not present

## 2017-01-13 DIAGNOSIS — E1122 Type 2 diabetes mellitus with diabetic chronic kidney disease: Secondary | ICD-10-CM | POA: Diagnosis not present

## 2017-01-13 DIAGNOSIS — N2581 Secondary hyperparathyroidism of renal origin: Secondary | ICD-10-CM | POA: Diagnosis not present

## 2017-01-13 DIAGNOSIS — N186 End stage renal disease: Secondary | ICD-10-CM | POA: Diagnosis not present

## 2017-01-13 DIAGNOSIS — D631 Anemia in chronic kidney disease: Secondary | ICD-10-CM | POA: Diagnosis not present

## 2017-01-13 DIAGNOSIS — D509 Iron deficiency anemia, unspecified: Secondary | ICD-10-CM | POA: Diagnosis not present

## 2017-01-15 DIAGNOSIS — D509 Iron deficiency anemia, unspecified: Secondary | ICD-10-CM | POA: Diagnosis not present

## 2017-01-15 DIAGNOSIS — D631 Anemia in chronic kidney disease: Secondary | ICD-10-CM | POA: Diagnosis not present

## 2017-01-15 DIAGNOSIS — E1122 Type 2 diabetes mellitus with diabetic chronic kidney disease: Secondary | ICD-10-CM | POA: Diagnosis not present

## 2017-01-15 DIAGNOSIS — N2581 Secondary hyperparathyroidism of renal origin: Secondary | ICD-10-CM | POA: Diagnosis not present

## 2017-01-15 DIAGNOSIS — N186 End stage renal disease: Secondary | ICD-10-CM | POA: Diagnosis not present

## 2017-01-15 DIAGNOSIS — E876 Hypokalemia: Secondary | ICD-10-CM | POA: Diagnosis not present

## 2017-01-16 DIAGNOSIS — R197 Diarrhea, unspecified: Secondary | ICD-10-CM | POA: Insufficient documentation

## 2017-01-18 DIAGNOSIS — E876 Hypokalemia: Secondary | ICD-10-CM | POA: Diagnosis not present

## 2017-01-18 DIAGNOSIS — E1122 Type 2 diabetes mellitus with diabetic chronic kidney disease: Secondary | ICD-10-CM | POA: Diagnosis not present

## 2017-01-18 DIAGNOSIS — N186 End stage renal disease: Secondary | ICD-10-CM | POA: Diagnosis not present

## 2017-01-18 DIAGNOSIS — D509 Iron deficiency anemia, unspecified: Secondary | ICD-10-CM | POA: Diagnosis not present

## 2017-01-18 DIAGNOSIS — D631 Anemia in chronic kidney disease: Secondary | ICD-10-CM | POA: Diagnosis not present

## 2017-01-18 DIAGNOSIS — N2581 Secondary hyperparathyroidism of renal origin: Secondary | ICD-10-CM | POA: Diagnosis not present

## 2017-01-19 ENCOUNTER — Other Ambulatory Visit: Payer: Self-pay | Admitting: Internal Medicine

## 2017-01-19 DIAGNOSIS — E119 Type 2 diabetes mellitus without complications: Secondary | ICD-10-CM | POA: Diagnosis not present

## 2017-01-19 DIAGNOSIS — Z0000 Encounter for general adult medical examination without abnormal findings: Secondary | ICD-10-CM | POA: Diagnosis not present

## 2017-01-19 DIAGNOSIS — I120 Hypertensive chronic kidney disease with stage 5 chronic kidney disease or end stage renal disease: Secondary | ICD-10-CM | POA: Diagnosis not present

## 2017-01-19 DIAGNOSIS — Z1231 Encounter for screening mammogram for malignant neoplasm of breast: Secondary | ICD-10-CM

## 2017-01-19 DIAGNOSIS — J45909 Unspecified asthma, uncomplicated: Secondary | ICD-10-CM | POA: Diagnosis not present

## 2017-01-19 DIAGNOSIS — I10 Essential (primary) hypertension: Secondary | ICD-10-CM | POA: Diagnosis not present

## 2017-01-19 DIAGNOSIS — J449 Chronic obstructive pulmonary disease, unspecified: Secondary | ICD-10-CM | POA: Diagnosis not present

## 2017-01-19 DIAGNOSIS — E784 Other hyperlipidemia: Secondary | ICD-10-CM | POA: Diagnosis not present

## 2017-01-19 DIAGNOSIS — Z1389 Encounter for screening for other disorder: Secondary | ICD-10-CM | POA: Diagnosis not present

## 2017-01-19 DIAGNOSIS — Z6824 Body mass index (BMI) 24.0-24.9, adult: Secondary | ICD-10-CM | POA: Diagnosis not present

## 2017-01-19 DIAGNOSIS — I509 Heart failure, unspecified: Secondary | ICD-10-CM | POA: Diagnosis not present

## 2017-01-19 DIAGNOSIS — M810 Age-related osteoporosis without current pathological fracture: Secondary | ICD-10-CM | POA: Diagnosis not present

## 2017-01-19 DIAGNOSIS — I739 Peripheral vascular disease, unspecified: Secondary | ICD-10-CM | POA: Diagnosis not present

## 2017-01-20 DIAGNOSIS — N2581 Secondary hyperparathyroidism of renal origin: Secondary | ICD-10-CM | POA: Diagnosis not present

## 2017-01-20 DIAGNOSIS — E1122 Type 2 diabetes mellitus with diabetic chronic kidney disease: Secondary | ICD-10-CM | POA: Diagnosis not present

## 2017-01-20 DIAGNOSIS — E876 Hypokalemia: Secondary | ICD-10-CM | POA: Diagnosis not present

## 2017-01-20 DIAGNOSIS — N186 End stage renal disease: Secondary | ICD-10-CM | POA: Diagnosis not present

## 2017-01-20 DIAGNOSIS — D509 Iron deficiency anemia, unspecified: Secondary | ICD-10-CM | POA: Diagnosis not present

## 2017-01-20 DIAGNOSIS — D631 Anemia in chronic kidney disease: Secondary | ICD-10-CM | POA: Diagnosis not present

## 2017-01-22 DIAGNOSIS — N186 End stage renal disease: Secondary | ICD-10-CM | POA: Diagnosis not present

## 2017-01-22 DIAGNOSIS — D509 Iron deficiency anemia, unspecified: Secondary | ICD-10-CM | POA: Diagnosis not present

## 2017-01-22 DIAGNOSIS — N2581 Secondary hyperparathyroidism of renal origin: Secondary | ICD-10-CM | POA: Diagnosis not present

## 2017-01-22 DIAGNOSIS — D631 Anemia in chronic kidney disease: Secondary | ICD-10-CM | POA: Diagnosis not present

## 2017-01-22 DIAGNOSIS — E1122 Type 2 diabetes mellitus with diabetic chronic kidney disease: Secondary | ICD-10-CM | POA: Diagnosis not present

## 2017-01-22 DIAGNOSIS — E876 Hypokalemia: Secondary | ICD-10-CM | POA: Diagnosis not present

## 2017-01-23 DIAGNOSIS — N186 End stage renal disease: Secondary | ICD-10-CM | POA: Diagnosis not present

## 2017-01-23 DIAGNOSIS — Z992 Dependence on renal dialysis: Secondary | ICD-10-CM | POA: Diagnosis not present

## 2017-01-23 DIAGNOSIS — I159 Secondary hypertension, unspecified: Secondary | ICD-10-CM | POA: Diagnosis not present

## 2017-01-25 DIAGNOSIS — D631 Anemia in chronic kidney disease: Secondary | ICD-10-CM | POA: Diagnosis not present

## 2017-01-25 DIAGNOSIS — N186 End stage renal disease: Secondary | ICD-10-CM | POA: Diagnosis not present

## 2017-01-25 DIAGNOSIS — N2581 Secondary hyperparathyroidism of renal origin: Secondary | ICD-10-CM | POA: Diagnosis not present

## 2017-01-25 DIAGNOSIS — E876 Hypokalemia: Secondary | ICD-10-CM | POA: Diagnosis not present

## 2017-01-25 DIAGNOSIS — E1122 Type 2 diabetes mellitus with diabetic chronic kidney disease: Secondary | ICD-10-CM | POA: Diagnosis not present

## 2017-01-27 DIAGNOSIS — E876 Hypokalemia: Secondary | ICD-10-CM | POA: Diagnosis not present

## 2017-01-27 DIAGNOSIS — N186 End stage renal disease: Secondary | ICD-10-CM | POA: Diagnosis not present

## 2017-01-27 DIAGNOSIS — E1122 Type 2 diabetes mellitus with diabetic chronic kidney disease: Secondary | ICD-10-CM | POA: Diagnosis not present

## 2017-01-27 DIAGNOSIS — N2581 Secondary hyperparathyroidism of renal origin: Secondary | ICD-10-CM | POA: Diagnosis not present

## 2017-01-27 DIAGNOSIS — D631 Anemia in chronic kidney disease: Secondary | ICD-10-CM | POA: Diagnosis not present

## 2017-01-28 ENCOUNTER — Ambulatory Visit
Admission: RE | Admit: 2017-01-28 | Discharge: 2017-01-28 | Disposition: A | Discharge status: Home / Self Care | Payer: Medicare Other | Source: Ambulatory Visit | Attending: Internal Medicine | Admitting: Internal Medicine

## 2017-01-28 DIAGNOSIS — Z1231 Encounter for screening mammogram for malignant neoplasm of breast: Secondary | ICD-10-CM | POA: Diagnosis not present

## 2017-01-29 DIAGNOSIS — D631 Anemia in chronic kidney disease: Secondary | ICD-10-CM | POA: Diagnosis not present

## 2017-01-29 DIAGNOSIS — N2581 Secondary hyperparathyroidism of renal origin: Secondary | ICD-10-CM | POA: Diagnosis not present

## 2017-01-29 DIAGNOSIS — E876 Hypokalemia: Secondary | ICD-10-CM | POA: Diagnosis not present

## 2017-01-29 DIAGNOSIS — N186 End stage renal disease: Secondary | ICD-10-CM | POA: Diagnosis not present

## 2017-01-29 DIAGNOSIS — E1122 Type 2 diabetes mellitus with diabetic chronic kidney disease: Secondary | ICD-10-CM | POA: Diagnosis not present

## 2017-02-01 DIAGNOSIS — E876 Hypokalemia: Secondary | ICD-10-CM | POA: Diagnosis not present

## 2017-02-01 DIAGNOSIS — D631 Anemia in chronic kidney disease: Secondary | ICD-10-CM | POA: Diagnosis not present

## 2017-02-01 DIAGNOSIS — E1122 Type 2 diabetes mellitus with diabetic chronic kidney disease: Secondary | ICD-10-CM | POA: Diagnosis not present

## 2017-02-01 DIAGNOSIS — N186 End stage renal disease: Secondary | ICD-10-CM | POA: Diagnosis not present

## 2017-02-01 DIAGNOSIS — N2581 Secondary hyperparathyroidism of renal origin: Secondary | ICD-10-CM | POA: Diagnosis not present

## 2017-02-03 DIAGNOSIS — N2581 Secondary hyperparathyroidism of renal origin: Secondary | ICD-10-CM | POA: Diagnosis not present

## 2017-02-03 DIAGNOSIS — E876 Hypokalemia: Secondary | ICD-10-CM | POA: Diagnosis not present

## 2017-02-03 DIAGNOSIS — E1122 Type 2 diabetes mellitus with diabetic chronic kidney disease: Secondary | ICD-10-CM | POA: Diagnosis not present

## 2017-02-03 DIAGNOSIS — D631 Anemia in chronic kidney disease: Secondary | ICD-10-CM | POA: Diagnosis not present

## 2017-02-03 DIAGNOSIS — N186 End stage renal disease: Secondary | ICD-10-CM | POA: Diagnosis not present

## 2017-02-04 ENCOUNTER — Ambulatory Visit (INDEPENDENT_AMBULATORY_CARE_PROVIDER_SITE_OTHER): Payer: Medicare Other | Admitting: Orthopedic Surgery

## 2017-02-04 ENCOUNTER — Encounter (INDEPENDENT_AMBULATORY_CARE_PROVIDER_SITE_OTHER): Payer: Self-pay | Admitting: Orthopedic Surgery

## 2017-02-04 DIAGNOSIS — Z89432 Acquired absence of left foot: Secondary | ICD-10-CM

## 2017-02-04 DIAGNOSIS — I70262 Atherosclerosis of native arteries of extremities with gangrene, left leg: Secondary | ICD-10-CM

## 2017-02-04 NOTE — Progress Notes (Signed)
Office Visit Note   Patient: Destiny Day           Date of Birth: Dec 22, 1950           MRN: [id number] Visit Date: 02/04/2017              Requested by: Velna Hatchet, MD [address] [place], [place] [address] PCP: Velna Hatchet, MD  Chief Complaint  Patient presents with  . Left Foot - Routine Post Op    09/01/16 Left foot transmetatarsal amputation      HPI: Patient presents at follow-up status post transmetatarsal amputation the left. Patient states she's lost a lot of swelling in her foot in the shoe is twisting when she walks she feels unstable and is at risk for falling for the instability with her shoewear.  Assessment & Plan: Visit Diagnoses:  1. S/P transmetatarsal amputation of foot, left ([place])     Plan: Patient will follow up with [facility] for padding within the shoe to provide a more stable base. She may require double upright brace is.  Follow-Up Instructions: Return in about 3 months (around 05/07/2017).   Ortho Exam  Patient is alert, oriented, no adenopathy, well-dressed, normal affect, normal respiratory effort. Examination patient has a normal gait. She has dorsiflexion about 10 past neutral. Patient is given instructions to demonstrate heel cord stretching to do daily. She has good pulses there is no redness no cellulitis no open wounds no signs of infection there is no swelling. No plantar ulcers or calluses.  Imaging: No results found.  Labs: Lab Results  Component Value Date   HGBA1C 7.5 (H) 06/24/2016   HGBA1C 9.8 (H) 10/09/2015   HGBA1C 11.3 (H) 06/13/2015   REPTSTATUS 01/16/2014 FINAL 01/10/2014   CULT  01/10/2014    NO GROWTH 5 DAYS Performed at [facility] 11/29/2008    Orders:  No orders of the defined types were placed in this encounter.  No orders of the defined types were placed in this encounter.    Procedures: No procedures performed  Clinical Data: No additional  findings.  ROS:  All other systems negative, except as noted in the HPI. Review of Systems  Objective: Vital Signs: There were no vitals taken for this visit.  Specialty Comments:  No specialty comments available.  PMFS History: Patient Active Problem List   Diagnosis Date Noted  . Idiopathic chronic venous hypertension of both lower extremities with inflammation 10/13/2016  . S/P transmetatarsal amputation of foot, left ([place]) 09/21/2016  . Onychomycosis 08/15/2016  . Gangrene of toe of left foot ([place])   . PAD (peripheral artery disease) ([place]) 07/08/2016  . Atherosclerosis of native artery of left lower extremity with gangrene ([place]) 06/23/2016  . GERD (gastroesophageal reflux disease) 10/07/2015  . ARF (acute renal failure) ([place])   . Hypothermia 06/12/2015  . Acute on chronic renal failure ([place]) 06/12/2015  . CHF (congestive heart failure) ([place]) 07/30/2014  . CKD (chronic kidney disease) stage 4, GFR 15-29 ml/min (HCC) 06/27/2014  . Hypertensive heart disease 05/25/2013  . CKD (chronic kidney disease) stage 3, GFR 30-59 ml/min 05/25/2013  . Pulmonary edema 05/23/2013  . Type 2 diabetes mellitus with diabetic nephropathy ([place]) 05/23/2013  . Type 2 diabetes mellitus with hyperosmolar nonketotic hyperglycemia ([place]) 04/13/2013  . Chronic diastolic CHF (congestive heart failure) ([place]) 04/13/2013  . UGI bleed 03/31/2013  . Hypokalemia 08/24/2012  . Type II or unspecified type diabetes mellitus without mention of complication, not stated  as uncontrolled 12/27/2007  . HLD (hyperlipidemia) 12/27/2007  . ALLERGIC RHINITIS 12/27/2007  . Asthma 12/27/2007   Past Medical History:  Diagnosis Date  . Abdominal bruit   . Anemia   . Anxiety   . Arthritis    Osteoarthritis  . Asthma   . Cervical disc disease    "pinced nerve"  . CHF (congestive heart failure) ([place])   . Complication of anesthesia    " after I got home from my last procedure, I started itching."  . Diabetes mellitus     insulin dependent  . Diverticulitis   . ESRD on peritoneal dialysis [facility])    Hemodialysis - MWF  . GERD (gastroesophageal reflux disease)    from medications  . GI bleed 03/31/2013  . History of hiatal hernia   . Hyperlipidemia   . Hypertension   . Osteoporosis   . Pneumonia   . Seasonal allergies   . Shortness of breath dyspnea    WIth exertion  . Sleep apnea    can't afford cpap    Family History  Problem Relation Age of Onset  . Other Mother        not sure of cause of death  . Diabetes Father   . Pancreatic cancer Maternal Grandmother   . Colon cancer Neg Hx     Past Surgical History:  Procedure Laterality Date  . A/V SHUNTOGRAM N/A 09/22/2016   Procedure: A/V Shuntogram - left arm;  Surgeon: Serafina Mitchell, MD;  Location: [place] CV LAB;  Service: Cardiovascular;  Laterality: N/A;  . ABDOMINAL HYSTERECTOMY  1993`  . AMPUTATION Left 09/01/2016   Procedure: LEFT FOOT TRANSMETATARSAL AMPUTATION;  Surgeon: Newt Minion, MD;  Location: [facility];  Service: Orthopedics;  Laterality: Left;  . AV FISTULA PLACEMENT Left 04/21/2016   Procedure: INSERTION OF ARTERIOVENOUS (AV) GORE-TEX GRAFT ARM LEFT;  Surgeon: Elam Dutch, MD;  Location: [facility];  Service: Vascular;  Laterality: Left;  . [place] TRANSPOSITION Left 07/10/2014   Procedure: BASCILIC VEIN TRANSPOSITION;  Surgeon: Angelia Mould, MD;  Location: [facility];  Service: Vascular;  Laterality: Left;  . BASCILIC VEIN TRANSPOSITION Right 11/08/2014   Procedure: FIRST STAGE BASILIC VEIN TRANSPOSITION;  Surgeon: Angelia Mould, MD;  Location: [facility];  Service: Vascular;  Laterality: Right;  . BASCILIC VEIN TRANSPOSITION Right 01/18/2015   Procedure: SECOND STAGE BASILIC VEIN TRANSPOSITION;  Surgeon: Angelia Mould, MD;  Location: [facility] OR;  Service: Vascular;  Laterality: Right;  . ESOPHAGOGASTRODUODENOSCOPY N/A 03/31/2013   Procedure: ESOPHAGOGASTRODUODENOSCOPY (EGD);  Surgeon: Gatha Mayer, MD;   Location: [facility] ENDOSCOPY;  Service: Endoscopy;  Laterality: N/A;  . EYE SURGERY     laser surgery  . FEMORAL-POPLITEAL BYPASS GRAFT Left 07/08/2016   Procedure: LEFT  FEMORAL-BELOW KNEE POPLITEAL ARTERY BYPASS GRAFT USING 6MM X 80 CM PROPATEN GORETEX GRAFT WITH RINGS.;  Surgeon: Rosetta Posner, MD;  Location: [place];  Service: Vascular;  Laterality: Left;  . FISTULOGRAM Left 10/29/2014   Procedure: FISTULOGRAM;  Surgeon: Angelia Mould, MD;  Location: [facility] CATH LAB;  Service: Cardiovascular;  Laterality: Left;  . LIGATION OF ARTERIOVENOUS  FISTULA Left 04/21/2016   Procedure: LIGATION OF ARTERIOVENOUS  FISTULA LEFT ARM;  Surgeon: Elam Dutch, MD;  Location: [facility];  Service: Vascular;  Laterality: Left;  . PATCH ANGIOPLASTY Right 01/18/2015   Procedure: BASILIC VEIN PATCH ANGIOPLASTY USING VASCUGUARD PATCH;  Surgeon: Angelia Mould, MD;  Location: [facility];  Service: Vascular;  Laterality:  Right;  Marland Kitchen PERIPHERAL VASCULAR BALLOON ANGIOPLASTY  09/22/2016   Procedure: Peripheral Vascular Balloon Angioplasty;  Surgeon: Serafina Mitchell, MD;  Location: [facility] [place] [facility];  Service: Cardiovascular;;  Lt. Fistula  . PERIPHERAL VASCULAR CATHETERIZATION N/A 06/23/2016   Procedure: Abdominal Aortogram w/Lower Extremity;  Surgeon: Serafina Mitchell, MD;  Location: [place] [facility];  Service: Cardiovascular;  Laterality: N/A;  . PERIPHERAL VASCULAR CATHETERIZATION  06/23/2016   Procedure: Peripheral Vascular Intervention;  Surgeon: Serafina Mitchell, MD;  Location: [place] [facility];  Service: Cardiovascular;;  lt common and external illiac artery   Social History   Occupational History  . Retired    Social History Main Topics  . Smoking status: Former Smoker    Packs/day: 0.35    Years: 40.00    Types: Cigarettes    Quit date: 05/10/2012  . Smokeless tobacco: Never Used  . Alcohol use No  . Drug use: Yes     Comment: marijuana; quit in early 1980's  . Sexual activity: Yes

## 2017-02-05 DIAGNOSIS — N186 End stage renal disease: Secondary | ICD-10-CM | POA: Diagnosis not present

## 2017-02-05 DIAGNOSIS — N2581 Secondary hyperparathyroidism of renal origin: Secondary | ICD-10-CM | POA: Diagnosis not present

## 2017-02-05 DIAGNOSIS — E876 Hypokalemia: Secondary | ICD-10-CM | POA: Diagnosis not present

## 2017-02-05 DIAGNOSIS — E1122 Type 2 diabetes mellitus with diabetic chronic kidney disease: Secondary | ICD-10-CM | POA: Diagnosis not present

## 2017-02-05 DIAGNOSIS — D631 Anemia in chronic kidney disease: Secondary | ICD-10-CM | POA: Diagnosis not present

## 2017-02-08 DIAGNOSIS — N2581 Secondary hyperparathyroidism of renal origin: Secondary | ICD-10-CM | POA: Diagnosis not present

## 2017-02-08 DIAGNOSIS — E1122 Type 2 diabetes mellitus with diabetic chronic kidney disease: Secondary | ICD-10-CM | POA: Diagnosis not present

## 2017-02-08 DIAGNOSIS — E876 Hypokalemia: Secondary | ICD-10-CM | POA: Diagnosis not present

## 2017-02-08 DIAGNOSIS — N186 End stage renal disease: Secondary | ICD-10-CM | POA: Diagnosis not present

## 2017-02-08 DIAGNOSIS — D631 Anemia in chronic kidney disease: Secondary | ICD-10-CM | POA: Diagnosis not present

## 2017-02-09 ENCOUNTER — Ambulatory Visit (INDEPENDENT_AMBULATORY_CARE_PROVIDER_SITE_OTHER): Payer: Medicare Other | Admitting: Orthopedic Surgery

## 2017-02-09 DIAGNOSIS — F4310 Post-traumatic stress disorder, unspecified: Secondary | ICD-10-CM | POA: Diagnosis not present

## 2017-02-10 DIAGNOSIS — E1122 Type 2 diabetes mellitus with diabetic chronic kidney disease: Secondary | ICD-10-CM | POA: Diagnosis not present

## 2017-02-10 DIAGNOSIS — D631 Anemia in chronic kidney disease: Secondary | ICD-10-CM | POA: Diagnosis not present

## 2017-02-10 DIAGNOSIS — N186 End stage renal disease: Secondary | ICD-10-CM | POA: Diagnosis not present

## 2017-02-10 DIAGNOSIS — E876 Hypokalemia: Secondary | ICD-10-CM | POA: Diagnosis not present

## 2017-02-10 DIAGNOSIS — N2581 Secondary hyperparathyroidism of renal origin: Secondary | ICD-10-CM | POA: Diagnosis not present

## 2017-02-12 DIAGNOSIS — E1122 Type 2 diabetes mellitus with diabetic chronic kidney disease: Secondary | ICD-10-CM | POA: Diagnosis not present

## 2017-02-12 DIAGNOSIS — E876 Hypokalemia: Secondary | ICD-10-CM | POA: Diagnosis not present

## 2017-02-12 DIAGNOSIS — D631 Anemia in chronic kidney disease: Secondary | ICD-10-CM | POA: Diagnosis not present

## 2017-02-12 DIAGNOSIS — N186 End stage renal disease: Secondary | ICD-10-CM | POA: Diagnosis not present

## 2017-02-12 DIAGNOSIS — N2581 Secondary hyperparathyroidism of renal origin: Secondary | ICD-10-CM | POA: Diagnosis not present

## 2017-02-15 DIAGNOSIS — N186 End stage renal disease: Secondary | ICD-10-CM | POA: Diagnosis not present

## 2017-02-15 DIAGNOSIS — E1122 Type 2 diabetes mellitus with diabetic chronic kidney disease: Secondary | ICD-10-CM | POA: Diagnosis not present

## 2017-02-15 DIAGNOSIS — N2581 Secondary hyperparathyroidism of renal origin: Secondary | ICD-10-CM | POA: Diagnosis not present

## 2017-02-15 DIAGNOSIS — D631 Anemia in chronic kidney disease: Secondary | ICD-10-CM | POA: Diagnosis not present

## 2017-02-15 DIAGNOSIS — E876 Hypokalemia: Secondary | ICD-10-CM | POA: Diagnosis not present

## 2017-02-17 DIAGNOSIS — N186 End stage renal disease: Secondary | ICD-10-CM | POA: Diagnosis not present

## 2017-02-17 DIAGNOSIS — E1122 Type 2 diabetes mellitus with diabetic chronic kidney disease: Secondary | ICD-10-CM | POA: Diagnosis not present

## 2017-02-17 DIAGNOSIS — D631 Anemia in chronic kidney disease: Secondary | ICD-10-CM | POA: Diagnosis not present

## 2017-02-17 DIAGNOSIS — E876 Hypokalemia: Secondary | ICD-10-CM | POA: Diagnosis not present

## 2017-02-17 DIAGNOSIS — N2581 Secondary hyperparathyroidism of renal origin: Secondary | ICD-10-CM | POA: Diagnosis not present

## 2017-02-19 DIAGNOSIS — N186 End stage renal disease: Secondary | ICD-10-CM | POA: Diagnosis not present

## 2017-02-19 DIAGNOSIS — D631 Anemia in chronic kidney disease: Secondary | ICD-10-CM | POA: Diagnosis not present

## 2017-02-19 DIAGNOSIS — E876 Hypokalemia: Secondary | ICD-10-CM | POA: Diagnosis not present

## 2017-02-19 DIAGNOSIS — E1122 Type 2 diabetes mellitus with diabetic chronic kidney disease: Secondary | ICD-10-CM | POA: Diagnosis not present

## 2017-02-19 DIAGNOSIS — N2581 Secondary hyperparathyroidism of renal origin: Secondary | ICD-10-CM | POA: Diagnosis not present

## 2017-02-22 DIAGNOSIS — N2581 Secondary hyperparathyroidism of renal origin: Secondary | ICD-10-CM | POA: Diagnosis not present

## 2017-02-22 DIAGNOSIS — E876 Hypokalemia: Secondary | ICD-10-CM | POA: Diagnosis not present

## 2017-02-22 DIAGNOSIS — D631 Anemia in chronic kidney disease: Secondary | ICD-10-CM | POA: Diagnosis not present

## 2017-02-22 DIAGNOSIS — N186 End stage renal disease: Secondary | ICD-10-CM | POA: Diagnosis not present

## 2017-02-22 DIAGNOSIS — E1122 Type 2 diabetes mellitus with diabetic chronic kidney disease: Secondary | ICD-10-CM | POA: Diagnosis not present

## 2017-02-23 DIAGNOSIS — N186 End stage renal disease: Secondary | ICD-10-CM | POA: Diagnosis not present

## 2017-02-23 DIAGNOSIS — Z992 Dependence on renal dialysis: Secondary | ICD-10-CM | POA: Diagnosis not present

## 2017-02-23 DIAGNOSIS — I159 Secondary hypertension, unspecified: Secondary | ICD-10-CM | POA: Diagnosis not present

## 2017-02-23 DIAGNOSIS — F4310 Post-traumatic stress disorder, unspecified: Secondary | ICD-10-CM | POA: Diagnosis not present

## 2017-02-24 DIAGNOSIS — E876 Hypokalemia: Secondary | ICD-10-CM | POA: Diagnosis not present

## 2017-02-24 DIAGNOSIS — E1122 Type 2 diabetes mellitus with diabetic chronic kidney disease: Secondary | ICD-10-CM | POA: Diagnosis not present

## 2017-02-24 DIAGNOSIS — N2581 Secondary hyperparathyroidism of renal origin: Secondary | ICD-10-CM | POA: Diagnosis not present

## 2017-02-24 DIAGNOSIS — N186 End stage renal disease: Secondary | ICD-10-CM | POA: Diagnosis not present

## 2017-02-25 ENCOUNTER — Emergency Department (HOSPITAL_COMMUNITY)
Admission: EM | Admit: 2017-02-25 | Discharge: 2017-02-25 | Disposition: A | Discharge status: Home / Self Care | Payer: No Typology Code available for payment source | Attending: Emergency Medicine | Admitting: Emergency Medicine

## 2017-02-25 ENCOUNTER — Emergency Department (HOSPITAL_COMMUNITY): Payer: No Typology Code available for payment source

## 2017-02-25 ENCOUNTER — Encounter (HOSPITAL_COMMUNITY): Payer: Self-pay | Admitting: *Deleted

## 2017-02-25 DIAGNOSIS — E1151 Type 2 diabetes mellitus with diabetic peripheral angiopathy without gangrene: Secondary | ICD-10-CM | POA: Diagnosis not present

## 2017-02-25 DIAGNOSIS — R079 Chest pain, unspecified: Secondary | ICD-10-CM | POA: Diagnosis not present

## 2017-02-25 DIAGNOSIS — I110 Hypertensive heart disease with heart failure: Secondary | ICD-10-CM | POA: Diagnosis not present

## 2017-02-25 DIAGNOSIS — M791 Myalgia: Secondary | ICD-10-CM | POA: Diagnosis not present

## 2017-02-25 DIAGNOSIS — Z992 Dependence on renal dialysis: Secondary | ICD-10-CM | POA: Insufficient documentation

## 2017-02-25 DIAGNOSIS — Y998 Other external cause status: Secondary | ICD-10-CM | POA: Diagnosis not present

## 2017-02-25 DIAGNOSIS — M7918 Myalgia, other site: Secondary | ICD-10-CM

## 2017-02-25 DIAGNOSIS — J45909 Unspecified asthma, uncomplicated: Secondary | ICD-10-CM | POA: Diagnosis not present

## 2017-02-25 DIAGNOSIS — E785 Hyperlipidemia, unspecified: Secondary | ICD-10-CM | POA: Insufficient documentation

## 2017-02-25 DIAGNOSIS — E119 Type 2 diabetes mellitus without complications: Secondary | ICD-10-CM | POA: Insufficient documentation

## 2017-02-25 DIAGNOSIS — Z6825 Body mass index (BMI) 25.0-25.9, adult: Secondary | ICD-10-CM | POA: Diagnosis not present

## 2017-02-25 DIAGNOSIS — S20212A Contusion of left front wall of thorax, initial encounter: Secondary | ICD-10-CM | POA: Diagnosis not present

## 2017-02-25 DIAGNOSIS — Z87891 Personal history of nicotine dependence: Secondary | ICD-10-CM | POA: Diagnosis not present

## 2017-02-25 DIAGNOSIS — I5032 Chronic diastolic (congestive) heart failure: Secondary | ICD-10-CM | POA: Diagnosis not present

## 2017-02-25 DIAGNOSIS — Y939 Activity, unspecified: Secondary | ICD-10-CM | POA: Diagnosis not present

## 2017-02-25 DIAGNOSIS — Z794 Long term (current) use of insulin: Secondary | ICD-10-CM | POA: Insufficient documentation

## 2017-02-25 DIAGNOSIS — I120 Hypertensive chronic kidney disease with stage 5 chronic kidney disease or end stage renal disease: Secondary | ICD-10-CM | POA: Diagnosis not present

## 2017-02-25 DIAGNOSIS — M25561 Pain in right knee: Secondary | ICD-10-CM | POA: Diagnosis not present

## 2017-02-25 DIAGNOSIS — S299XXA Unspecified injury of thorax, initial encounter: Secondary | ICD-10-CM | POA: Diagnosis present

## 2017-02-25 DIAGNOSIS — Z79899 Other long term (current) drug therapy: Secondary | ICD-10-CM | POA: Diagnosis not present

## 2017-02-25 DIAGNOSIS — Y92410 Unspecified street and highway as the place of occurrence of the external cause: Secondary | ICD-10-CM | POA: Insufficient documentation

## 2017-02-25 DIAGNOSIS — I10 Essential (primary) hypertension: Secondary | ICD-10-CM | POA: Diagnosis not present

## 2017-02-25 DIAGNOSIS — N186 End stage renal disease: Secondary | ICD-10-CM | POA: Insufficient documentation

## 2017-02-25 MED ORDER — OXYCODONE-ACETAMINOPHEN 5-325 MG PO TABS: 1.0000 | ORAL_TABLET | ORAL | 0 refills | Status: DC | PRN

## 2017-02-25 MED ORDER — HYDROCODONE-ACETAMINOPHEN 5-325 MG PO TABS: ORAL_TABLET | ORAL | 0 refills | Status: DC

## 2017-02-25 MED ORDER — HYDROCODONE-ACETAMINOPHEN 5-325 MG PO TABS
1.0000 | ORAL_TABLET | Freq: Once | ORAL | Status: AC
  Administered 2017-02-25: 1 via ORAL
  Filled 2017-02-25: qty 1
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
## 2017-02-25 NOTE — ED Notes (Signed)
Patient transported to X-ray 

## 2017-02-25 NOTE — ED Notes (Signed)
Upset crying  Warm blanket given

## 2017-02-25 NOTE — ED Triage Notes (Signed)
Last dialysis was Wednesday  Fistula lt arm
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
## 2017-02-25 NOTE — Discharge Instructions (Addendum)
Take percocet for breakthrough pain, do not drink alcohol, drive, care for children or do other critical tasks while taking percocet.   It is very important that you take deep breaths to prevent lung collapse and infection.  Either use your incentive spirometer or take 10 deep breaths every hour to prevent lung collapse.  If you develop cough, fever or shortness of breath return immediately to the emergency room.    Please follow with your primary care doctor in the next 2 days for a check-up. They must obtain records for further management.   Do not hesitate to return to the Emergency Department for any new, worsening or concerning symptoms.

## 2017-02-26 DIAGNOSIS — N2581 Secondary hyperparathyroidism of renal origin: Secondary | ICD-10-CM | POA: Diagnosis not present

## 2017-02-26 DIAGNOSIS — E1122 Type 2 diabetes mellitus with diabetic chronic kidney disease: Secondary | ICD-10-CM | POA: Diagnosis not present

## 2017-02-26 DIAGNOSIS — N186 End stage renal disease: Secondary | ICD-10-CM | POA: Diagnosis not present

## 2017-02-26 DIAGNOSIS — E876 Hypokalemia: Secondary | ICD-10-CM | POA: Diagnosis not present

## 2017-03-01 DIAGNOSIS — E1122 Type 2 diabetes mellitus with diabetic chronic kidney disease: Secondary | ICD-10-CM | POA: Diagnosis not present

## 2017-03-01 DIAGNOSIS — N186 End stage renal disease: Secondary | ICD-10-CM | POA: Diagnosis not present

## 2017-03-01 DIAGNOSIS — E876 Hypokalemia: Secondary | ICD-10-CM | POA: Diagnosis not present

## 2017-03-01 DIAGNOSIS — N2581 Secondary hyperparathyroidism of renal origin: Secondary | ICD-10-CM | POA: Diagnosis not present

## 2017-03-02 ENCOUNTER — Ambulatory Visit (HOSPITAL_COMMUNITY)
Admission: EM | Admit: 2017-03-02 | Discharge: 2017-03-02 | Disposition: A | Discharge status: Home / Self Care | Payer: Medicare Other | Attending: Family Medicine | Admitting: Family Medicine

## 2017-03-02 ENCOUNTER — Encounter (HOSPITAL_COMMUNITY): Payer: Self-pay | Admitting: *Deleted

## 2017-03-02 DIAGNOSIS — R0781 Pleurodynia: Secondary | ICD-10-CM
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
## 2017-03-03 DIAGNOSIS — E1122 Type 2 diabetes mellitus with diabetic chronic kidney disease: Secondary | ICD-10-CM | POA: Diagnosis not present

## 2017-03-03 DIAGNOSIS — N186 End stage renal disease: Secondary | ICD-10-CM | POA: Diagnosis not present

## 2017-03-03 DIAGNOSIS — N2581 Secondary hyperparathyroidism of renal origin: Secondary | ICD-10-CM | POA: Diagnosis not present

## 2017-03-03 DIAGNOSIS — E876 Hypokalemia: Secondary | ICD-10-CM | POA: Diagnosis not present

## 2017-03-05 DIAGNOSIS — N2581 Secondary hyperparathyroidism of renal origin: Secondary | ICD-10-CM | POA: Diagnosis not present

## 2017-03-05 DIAGNOSIS — N186 End stage renal disease: Secondary | ICD-10-CM | POA: Diagnosis not present

## 2017-03-05 DIAGNOSIS — E876 Hypokalemia: Secondary | ICD-10-CM | POA: Diagnosis not present

## 2017-03-05 DIAGNOSIS — E1122 Type 2 diabetes mellitus with diabetic chronic kidney disease: Secondary | ICD-10-CM | POA: Diagnosis not present

## 2017-03-08 DIAGNOSIS — E876 Hypokalemia: Secondary | ICD-10-CM | POA: Diagnosis not present

## 2017-03-08 DIAGNOSIS — E1122 Type 2 diabetes mellitus with diabetic chronic kidney disease: Secondary | ICD-10-CM | POA: Diagnosis not present

## 2017-03-08 DIAGNOSIS — N186 End stage renal disease: Secondary | ICD-10-CM | POA: Diagnosis not present

## 2017-03-08 DIAGNOSIS — N2581 Secondary hyperparathyroidism of renal origin: Secondary | ICD-10-CM | POA: Diagnosis not present

## 2017-03-09 DIAGNOSIS — F4310 Post-traumatic stress disorder, unspecified: Secondary | ICD-10-CM | POA: Diagnosis not present

## 2017-03-10 DIAGNOSIS — E1122 Type 2 diabetes mellitus with diabetic chronic kidney disease: Secondary | ICD-10-CM | POA: Diagnosis not present

## 2017-03-10 DIAGNOSIS — N2581 Secondary hyperparathyroidism of renal origin: Secondary | ICD-10-CM | POA: Diagnosis not present

## 2017-03-10 DIAGNOSIS — N186 End stage renal disease: Secondary | ICD-10-CM | POA: Diagnosis not present

## 2017-03-10 DIAGNOSIS — E876 Hypokalemia: Secondary | ICD-10-CM | POA: Diagnosis not present

## 2017-03-12 DIAGNOSIS — E1122 Type 2 diabetes mellitus with diabetic chronic kidney disease: Secondary | ICD-10-CM | POA: Diagnosis not present

## 2017-03-12 DIAGNOSIS — E876 Hypokalemia: Secondary | ICD-10-CM | POA: Diagnosis not present

## 2017-03-12 DIAGNOSIS — N186 End stage renal disease: Secondary | ICD-10-CM | POA: Diagnosis not present

## 2017-03-12 DIAGNOSIS — N2581 Secondary hyperparathyroidism of renal origin: Secondary | ICD-10-CM | POA: Diagnosis not present

## 2017-03-15 DIAGNOSIS — N186 End stage renal disease: Secondary | ICD-10-CM | POA: Diagnosis not present

## 2017-03-15 DIAGNOSIS — E876 Hypokalemia: Secondary | ICD-10-CM | POA: Diagnosis not present

## 2017-03-15 DIAGNOSIS — N2581 Secondary hyperparathyroidism of renal origin: Secondary | ICD-10-CM | POA: Diagnosis not present

## 2017-03-15 DIAGNOSIS — E1122 Type 2 diabetes mellitus with diabetic chronic kidney disease: Secondary | ICD-10-CM | POA: Diagnosis not present

## 2017-03-16 NOTE — Addendum Note (Signed)
Addended by: Lianne Cure A on: 03/16/2017 10:41 AM   Modules accepted: Orders

## 2017-03-17 DIAGNOSIS — E876 Hypokalemia: Secondary | ICD-10-CM | POA: Diagnosis not present

## 2017-03-17 DIAGNOSIS — N2581 Secondary hyperparathyroidism of renal origin: Secondary | ICD-10-CM | POA: Diagnosis not present

## 2017-03-17 DIAGNOSIS — N186 End stage renal disease: Secondary | ICD-10-CM | POA: Diagnosis not present

## 2017-03-17 DIAGNOSIS — E1122 Type 2 diabetes mellitus with diabetic chronic kidney disease: Secondary | ICD-10-CM | POA: Diagnosis not present

## 2017-03-19 DIAGNOSIS — N186 End stage renal disease: Secondary | ICD-10-CM | POA: Diagnosis not present

## 2017-03-19 DIAGNOSIS — E876 Hypokalemia: Secondary | ICD-10-CM | POA: Diagnosis not present

## 2017-03-19 DIAGNOSIS — E1122 Type 2 diabetes mellitus with diabetic chronic kidney disease: Secondary | ICD-10-CM | POA: Diagnosis not present

## 2017-03-19 DIAGNOSIS — N2581 Secondary hyperparathyroidism of renal origin: Secondary | ICD-10-CM | POA: Diagnosis not present

## 2017-03-22 DIAGNOSIS — N186 End stage renal disease: Secondary | ICD-10-CM | POA: Diagnosis not present

## 2017-03-22 DIAGNOSIS — N2581 Secondary hyperparathyroidism of renal origin: Secondary | ICD-10-CM | POA: Diagnosis not present

## 2017-03-22 DIAGNOSIS — E876 Hypokalemia: Secondary | ICD-10-CM | POA: Diagnosis not present

## 2017-03-22 DIAGNOSIS — E1122 Type 2 diabetes mellitus with diabetic chronic kidney disease: Secondary | ICD-10-CM | POA: Diagnosis not present

## 2017-03-23 ENCOUNTER — Encounter (HOSPITAL_COMMUNITY): Payer: Medicare Other

## 2017-03-23 ENCOUNTER — Ambulatory Visit: Payer: Medicare Other

## 2017-03-24 DIAGNOSIS — N186 End stage renal disease: Secondary | ICD-10-CM | POA: Diagnosis not present

## 2017-03-24 DIAGNOSIS — N2581 Secondary hyperparathyroidism of renal origin: Secondary | ICD-10-CM | POA: Diagnosis not present

## 2017-03-24 DIAGNOSIS — E876 Hypokalemia: Secondary | ICD-10-CM | POA: Diagnosis not present

## 2017-03-24 DIAGNOSIS — E1122 Type 2 diabetes mellitus with diabetic chronic kidney disease: Secondary | ICD-10-CM | POA: Diagnosis not present

## 2017-03-25 ENCOUNTER — Encounter: Payer: Self-pay | Admitting: *Deleted

## 2017-03-25 ENCOUNTER — Other Ambulatory Visit: Payer: Self-pay | Admitting: *Deleted

## 2017-03-25 ENCOUNTER — Encounter: Payer: Self-pay | Admitting: Family

## 2017-03-25 ENCOUNTER — Ambulatory Visit (INDEPENDENT_AMBULATORY_CARE_PROVIDER_SITE_OTHER): Payer: Medicare Other | Admitting: Family

## 2017-03-25 ENCOUNTER — Ambulatory Visit (HOSPITAL_COMMUNITY)
Admission: RE | Admit: 2017-03-25 | Discharge: 2017-03-25 | Disposition: A | Discharge status: Home / Self Care | Payer: Medicare Other | Source: Ambulatory Visit | Attending: Vascular Surgery | Admitting: Vascular Surgery

## 2017-03-25 ENCOUNTER — Ambulatory Visit (INDEPENDENT_AMBULATORY_CARE_PROVIDER_SITE_OTHER)
Admission: RE | Admit: 2017-03-25 | Discharge: 2017-03-25 | Disposition: A | Discharge status: Home / Self Care | Payer: Medicare Other | Source: Ambulatory Visit | Attending: Vascular Surgery | Admitting: Vascular Surgery

## 2017-03-25 VITALS — BP 144/70 | HR 92 | Temp 97.5°F | Resp 16 | Ht 64.5 in | Wt 151.0 lb

## 2017-03-25 DIAGNOSIS — N186 End stage renal disease: Secondary | ICD-10-CM

## 2017-03-25 DIAGNOSIS — I10 Essential (primary) hypertension: Secondary | ICD-10-CM | POA: Diagnosis not present

## 2017-03-25 DIAGNOSIS — Z87891 Personal history of nicotine dependence: Secondary | ICD-10-CM | POA: Diagnosis not present

## 2017-03-25 DIAGNOSIS — Z992 Dependence on renal dialysis: Secondary | ICD-10-CM

## 2017-03-25 DIAGNOSIS — E785 Hyperlipidemia, unspecified: Secondary | ICD-10-CM | POA: Insufficient documentation

## 2017-03-25 DIAGNOSIS — R938 Abnormal findings on diagnostic imaging of other specified body structures: Secondary | ICD-10-CM | POA: Diagnosis not present

## 2017-03-25 DIAGNOSIS — I70262 Atherosclerosis of native arteries of extremities with gangrene, left leg: Secondary | ICD-10-CM | POA: Diagnosis not present

## 2017-03-25 DIAGNOSIS — Z95828 Presence of other vascular implants and grafts: Secondary | ICD-10-CM

## 2017-03-25 DIAGNOSIS — I708 Atherosclerosis of other arteries: Secondary | ICD-10-CM | POA: Diagnosis not present

## 2017-03-25 DIAGNOSIS — I739 Peripheral vascular disease, unspecified: Secondary | ICD-10-CM

## 2017-03-25 DIAGNOSIS — Z89432 Acquired absence of left foot: Secondary | ICD-10-CM

## 2017-03-25 DIAGNOSIS — E1151 Type 2 diabetes mellitus with diabetic peripheral angiopathy without gangrene: Secondary | ICD-10-CM | POA: Insufficient documentation

## 2017-03-25 DIAGNOSIS — R0989 Other specified symptoms and signs involving the circulatory and respiratory systems: Secondary | ICD-10-CM | POA: Diagnosis present

## 2017-03-25 NOTE — Patient Instructions (Signed)
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
## 2017-03-26 DIAGNOSIS — E876 Hypokalemia: Secondary | ICD-10-CM | POA: Diagnosis not present

## 2017-03-26 DIAGNOSIS — N186 End stage renal disease: Secondary | ICD-10-CM | POA: Diagnosis not present

## 2017-03-26 DIAGNOSIS — Z992 Dependence on renal dialysis: Secondary | ICD-10-CM | POA: Diagnosis not present

## 2017-03-26 DIAGNOSIS — I159 Secondary hypertension, unspecified: Secondary | ICD-10-CM | POA: Diagnosis not present

## 2017-03-26 DIAGNOSIS — N2581 Secondary hyperparathyroidism of renal origin: Secondary | ICD-10-CM | POA: Diagnosis not present

## 2017-03-26 DIAGNOSIS — E1122 Type 2 diabetes mellitus with diabetic chronic kidney disease: Secondary | ICD-10-CM | POA: Diagnosis not present

## 2017-03-29 DIAGNOSIS — E1122 Type 2 diabetes mellitus with diabetic chronic kidney disease: Secondary | ICD-10-CM | POA: Diagnosis not present

## 2017-03-29 DIAGNOSIS — N186 End stage renal disease: Secondary | ICD-10-CM | POA: Diagnosis not present

## 2017-03-29 DIAGNOSIS — N2581 Secondary hyperparathyroidism of renal origin: Secondary | ICD-10-CM | POA: Diagnosis not present

## 2017-03-29 DIAGNOSIS — E876 Hypokalemia: Secondary | ICD-10-CM | POA: Diagnosis not present

## 2017-03-29 DIAGNOSIS — Z23 Encounter for immunization: Secondary | ICD-10-CM | POA: Diagnosis not present

## 2017-03-31 DIAGNOSIS — Z23 Encounter for immunization: Secondary | ICD-10-CM | POA: Diagnosis not present

## 2017-03-31 DIAGNOSIS — N2581 Secondary hyperparathyroidism of renal origin: Secondary | ICD-10-CM | POA: Diagnosis not present

## 2017-03-31 DIAGNOSIS — N186 End stage renal disease: Secondary | ICD-10-CM | POA: Diagnosis not present

## 2017-03-31 DIAGNOSIS — E876 Hypokalemia: Secondary | ICD-10-CM | POA: Diagnosis not present

## 2017-03-31 DIAGNOSIS — E1122 Type 2 diabetes mellitus with diabetic chronic kidney disease: Secondary | ICD-10-CM | POA: Diagnosis not present

## 2017-04-02 DIAGNOSIS — N2581 Secondary hyperparathyroidism of renal origin: Secondary | ICD-10-CM | POA: Diagnosis not present

## 2017-04-02 DIAGNOSIS — Z23 Encounter for immunization: Secondary | ICD-10-CM | POA: Diagnosis not present

## 2017-04-02 DIAGNOSIS — N186 End stage renal disease: Secondary | ICD-10-CM | POA: Diagnosis not present

## 2017-04-02 DIAGNOSIS — E876 Hypokalemia: Secondary | ICD-10-CM | POA: Diagnosis not present

## 2017-04-02 DIAGNOSIS — E1122 Type 2 diabetes mellitus with diabetic chronic kidney disease: Secondary | ICD-10-CM | POA: Diagnosis not present

## 2017-04-05 ENCOUNTER — Other Ambulatory Visit: Payer: Self-pay

## 2017-04-05 DIAGNOSIS — N2581 Secondary hyperparathyroidism of renal origin: Secondary | ICD-10-CM | POA: Diagnosis not present

## 2017-04-05 DIAGNOSIS — E876 Hypokalemia: Secondary | ICD-10-CM | POA: Diagnosis not present

## 2017-04-05 DIAGNOSIS — N186 End stage renal disease: Secondary | ICD-10-CM | POA: Diagnosis not present

## 2017-04-05 DIAGNOSIS — Z23 Encounter for immunization: Secondary | ICD-10-CM | POA: Diagnosis not present

## 2017-04-05 DIAGNOSIS — E1122 Type 2 diabetes mellitus with diabetic chronic kidney disease: Secondary | ICD-10-CM | POA: Diagnosis not present

## 2017-04-06 ENCOUNTER — Encounter (HOSPITAL_COMMUNITY)
Admission: RE | Disposition: A | Discharge status: Home / Self Care | Payer: Self-pay | Source: Ambulatory Visit | Attending: Surgery

## 2017-04-06 ENCOUNTER — Ambulatory Visit (HOSPITAL_COMMUNITY)
Admission: RE | Admit: 2017-04-06 | Discharge: 2017-04-06 | Disposition: A | Discharge status: Home / Self Care | Payer: Medicare Other | Source: Ambulatory Visit | Attending: Surgery | Admitting: Surgery

## 2017-04-06 DIAGNOSIS — E1151 Type 2 diabetes mellitus with diabetic peripheral angiopathy without gangrene: Secondary | ICD-10-CM | POA: Insufficient documentation

## 2017-04-06 DIAGNOSIS — E1122 Type 2 diabetes mellitus with diabetic chronic kidney disease: Secondary | ICD-10-CM | POA: Diagnosis not present

## 2017-04-06 DIAGNOSIS — Z794 Long term (current) use of insulin: Secondary | ICD-10-CM | POA: Diagnosis not present

## 2017-04-06 DIAGNOSIS — Z7902 Long term (current) use of antithrombotics/antiplatelets: Secondary | ICD-10-CM | POA: Insufficient documentation

## 2017-04-06 DIAGNOSIS — I509 Heart failure, unspecified: Secondary | ICD-10-CM | POA: Diagnosis not present

## 2017-04-06 DIAGNOSIS — L97519 Non-pressure chronic ulcer of other part of right foot with unspecified severity: Secondary | ICD-10-CM | POA: Diagnosis not present

## 2017-04-06 DIAGNOSIS — Z87891 Personal history of nicotine dependence: Secondary | ICD-10-CM | POA: Insufficient documentation

## 2017-04-06 DIAGNOSIS — J45909 Unspecified asthma, uncomplicated: Secondary | ICD-10-CM | POA: Diagnosis not present

## 2017-04-06 DIAGNOSIS — Z7982 Long term (current) use of aspirin: Secondary | ICD-10-CM | POA: Insufficient documentation

## 2017-04-06 DIAGNOSIS — Z89432 Acquired absence of left foot: Secondary | ICD-10-CM | POA: Insufficient documentation

## 2017-04-06 DIAGNOSIS — E11621 Type 2 diabetes mellitus with foot ulcer: Secondary | ICD-10-CM | POA: Insufficient documentation

## 2017-04-06 DIAGNOSIS — K219 Gastro-esophageal reflux disease without esophagitis: Secondary | ICD-10-CM | POA: Diagnosis not present

## 2017-04-06 DIAGNOSIS — M1990 Unspecified osteoarthritis, unspecified site: Secondary | ICD-10-CM | POA: Insufficient documentation

## 2017-04-06 DIAGNOSIS — Z992 Dependence on renal dialysis: Secondary | ICD-10-CM | POA: Insufficient documentation

## 2017-04-06 DIAGNOSIS — Z7951 Long term (current) use of inhaled steroids: Secondary | ICD-10-CM | POA: Insufficient documentation

## 2017-04-06 DIAGNOSIS — F419 Anxiety disorder, unspecified: Secondary | ICD-10-CM | POA: Diagnosis not present

## 2017-04-06 DIAGNOSIS — I132 Hypertensive heart and chronic kidney disease with heart failure and with stage 5 chronic kidney disease, or end stage renal disease: Secondary | ICD-10-CM | POA: Diagnosis not present

## 2017-04-06 DIAGNOSIS — E785 Hyperlipidemia, unspecified: Secondary | ICD-10-CM | POA: Insufficient documentation

## 2017-04-06 DIAGNOSIS — N186 End stage renal disease: Secondary | ICD-10-CM | POA: Insufficient documentation

## 2017-04-06 DIAGNOSIS — G4730 Sleep apnea, unspecified: Secondary | ICD-10-CM | POA: Diagnosis not present

## 2017-04-06 DIAGNOSIS — I70235 Atherosclerosis of native arteries of right leg with ulceration of other part of foot: Secondary | ICD-10-CM | POA: Diagnosis not present

## 2017-04-06 HISTORY — PX: LOWER EXTREMITY ANGIOGRAPHY: CATH118251

## 2017-04-06 LAB — POCT I-STAT, CHEM 8
BUN: 29 mg/dL — ABNORMAL HIGH (ref 6–20)
Calcium, Ion: 1 mmol/L — ABNORMAL LOW (ref 1.15–1.40)
Chloride: 97 mmol/L — ABNORMAL LOW (ref 101–111)
Creatinine, Ser: 4.8 mg/dL — ABNORMAL HIGH (ref 0.44–1.00)
Glucose, Bld: 102 mg/dL — ABNORMAL HIGH (ref 65–99)
HCT: 36 % (ref 36.0–46.0)
Hemoglobin: 12.2 g/dL (ref 12.0–15.0)
Potassium: 4.3 mmol/L (ref 3.5–5.1)
Sodium: 137 mmol/L (ref 135–145)
TCO2: 30 mmol/L (ref 22–32)

## 2017-04-06 LAB — GLUCOSE, CAPILLARY: Glucose-Capillary: 100 mg/dL — ABNORMAL HIGH (ref 65–99)

## 2017-04-06 SURGERY — LOWER EXTREMITY ANGIOGRAPHY
Anesthesia: LOCAL

## 2017-04-06 MED ORDER — HYDRALAZINE HCL 20 MG/ML IJ SOLN: 5.0000 mg | INTRAMUSCULAR | Status: DC | PRN

## 2017-04-06 MED ORDER — HEPARIN (PORCINE) IN NACL 2-0.9 UNIT/ML-% IJ SOLN
INTRAMUSCULAR | Status: AC
  Filled 2017-04-06: qty 500

## 2017-04-06 MED ORDER — MIDAZOLAM HCL 2 MG/2ML IJ SOLN
INTRAMUSCULAR | Status: DC | PRN
  Administered 2017-04-06 (×2): 1 mg via INTRAVENOUS

## 2017-04-06 MED ORDER — SODIUM CHLORIDE 0.9% FLUSH: 3.0000 mL | INTRAVENOUS | Status: DC | PRN

## 2017-04-06 MED ORDER — LIDOCAINE HCL (PF) 1 % IJ SOLN
INTRAMUSCULAR | Status: DC | PRN
  Administered 2017-04-06: 20 mL

## 2017-04-06 MED ORDER — SODIUM CHLORIDE 0.9% FLUSH: 3.0000 mL | Freq: Two times a day (BID) | INTRAVENOUS | Status: DC

## 2017-04-06 MED ORDER — SODIUM CHLORIDE 0.9 % IV SOLN: 250.0000 mL | INTRAVENOUS | Status: DC | PRN

## 2017-04-06 MED ORDER — IODIXANOL 320 MG/ML IV SOLN
INTRAVENOUS | Status: DC | PRN
  Administered 2017-04-06: 107 mL via INTRA_ARTERIAL

## 2017-04-06 MED ORDER — FENTANYL CITRATE (PF) 100 MCG/2ML IJ SOLN
INTRAMUSCULAR | Status: AC
  Filled 2017-04-06: qty 2

## 2017-04-06 MED ORDER — LIDOCAINE HCL (PF) 1 % IJ SOLN
INTRAMUSCULAR | Status: AC
  Filled 2017-04-06: qty 30

## 2017-04-06 MED ORDER — MIDAZOLAM HCL 2 MG/2ML IJ SOLN
INTRAMUSCULAR | Status: AC
  Filled 2017-04-06: qty 2

## 2017-04-06 MED ORDER — LABETALOL HCL 5 MG/ML IV SOLN: 10.0000 mg | INTRAVENOUS | Status: DC | PRN

## 2017-04-06 MED ORDER — SODIUM CHLORIDE 0.9 % IV SOLN
INTRAVENOUS | Status: AC | PRN
  Administered 2017-04-06: 10 mL/h via INTRAVENOUS

## 2017-04-06 MED ORDER — HEPARIN (PORCINE) IN NACL 2-0.9 UNIT/ML-% IJ SOLN
INTRAMUSCULAR | Status: AC | PRN
  Administered 2017-04-06: 1000 mL

## 2017-04-06 MED ORDER — FENTANYL CITRATE (PF) 100 MCG/2ML IJ SOLN
INTRAMUSCULAR | Status: DC | PRN
  Administered 2017-04-06: 25 ug via INTRAVENOUS
  Administered 2017-04-06: 50 ug via INTRAVENOUS

## 2017-04-06 SURGICAL SUPPLY — 12 items
CATH OMNI FLUSH 5F 65CM (CATHETERS) ×2 IMPLANT
CATH SOFT-VU 4F 65 STRAIGHT (CATHETERS) ×1 IMPLANT
CATH SOFT-VU STRAIGHT 4F 65CM (CATHETERS) ×1
COVER PRB 48X5XTLSCP FOLD TPE (BAG) ×1 IMPLANT
COVER PROBE 5X48 (BAG) ×1
KIT MICROINTRODUCER STIFF 5F (SHEATH) ×2 IMPLANT
KIT PV (KITS) ×2 IMPLANT
SHEATH PINNACLE 5F 10CM (SHEATH) ×2 IMPLANT
SYR MEDRAD MARK V 150ML (SYRINGE) ×2 IMPLANT
TRANSDUCER W/STOPCOCK (MISCELLANEOUS) ×2 IMPLANT
TRAY PV CATH (CUSTOM PROCEDURE TRAY) ×2 IMPLANT
WIRE BENTSON .035X145CM (WIRE) ×2 IMPLANT

## 2017-04-06 NOTE — Interval H&P Note (Signed)
History and Physical Interval Note:  04/06/2017 2:54 PM  Destiny Day  has presented today for surgery, with the diagnosis of pvd - claudication  The various methods of treatment have been discussed with the patient and family. After consideration of risks, benefits and other options for treatment, the patient has consented to  Procedure(s): Lower Extremity Angiography - Right (N/A) as a surgical intervention .  The patient's history has been reviewed, patient examined, no change in status, stable for surgery.  I have reviewed the patient's chart and labs.  Questions were answered to the patient's satisfaction.     Annamarie Major

## 2017-04-06 NOTE — Progress Notes (Signed)
Site area:  Site Prior to Removal:  Level  Pressure Applied For: Manual:    Patient Status During Pull:   Post Pull Site:  Level Post Pull Instructions Given:   Post Pull Pulses Present:  Dressing Applied:   Bedrest begins @  Comments:

## 2017-04-06 NOTE — Op Note (Signed)
    Patient name: Destiny Day MRN: [id number] DOB: Oct 15, 1950 Sex: female  04/06/2017 Pre-operative Diagnosis: Right toe wound Post-operative diagnosis:  Same Surgeon:  Annamarie Major Procedure Performed:  1.  Ultrasound-guided access, left femoral artery  2.  Abdominal aortogram  3.  Bilateral lower extremity runoff  4.  Second order catheterization  5.  Conscious sedation (greater than 15 minutes)    Indications:  The patient has a history of a left toe wound which has healed after iliac intervention and femoral-popliteal bypass graft.  She has recently seen in the office and found to have a new wound on her right toe.  She comes in today for arteriogram.  Procedure:  The patient was identified in the holding area and taken to room 8.  The patient was then placed supine on the table and prepped and draped in the usual sterile fashion.  A time out was called.  Conscious sedation was administered with the use of IV fentanyl and Versed in a continuous physician and nurse monitoring.  Heart rate, blood pressure, and oxygen saturations were continuously monitored.  Ultrasound was used to evaluate the left common femoral artery.  It was patent .  A digital ultrasound image was acquired.  A micropuncture needle was used to access the left common femoral artery under ultrasound guidance.  An 018 wire was advanced without resistance and a micropuncture sheath was placed.  The 018 wire was removed and a benson wire was placed.  The micropuncture sheath was exchanged for a 5 french sheath.  An omniflush catheter was advanced over the wire to the level of L-1.  An abdominal angiogram was obtained.  Next, using the omniflush catheter and a benson wire, the aortic bifurcation was crossed and the catheter was placed into theright external iliac artery and right runoff was obtained.  left runoff was performed via retrograde sheath injections.  Findings:   Aortogram:  No significant renal artery stenosis  was identified.  No significant infrarenal aortic stenosis was identified.  Bilateral common and external iliac arteries are calcified but patent.  A pullback pressure through the right iliac system did not show significant pressure gradient.  Right Lower Extremity:  The right common femoral and profunda femoral artery are patent without stenosis.  There is a flush occlusion of the right superficial femoral artery with reconstitution of the below-knee popliteal artery.  The dominant runoff vessels of the posterior tibial and the peroneal artery.  Anterior tibial artery appears to occlude in the proximal third but then does reconstitute distally however it does not opacify across the ankle  Left Lower Extremity:  The left common femoral and profunda femoral artery are patent throughout their course.  There is a bypass graft from the common femoral to the below-knee popliteal artery which is widely patent without evidence of stenosis.  The dominant runoff is the peroneal and posterior tibial artery.  Intervention:  No percutaneous intervention performed.  Impression:  #1  flush occlusion of the right superficial femoral artery with reconstitution of the below-knee popliteal artery  #2  widely patent left femoral-popliteal bypass graft  #3  no significant pressure gradient throughout the right iliac system  #4  consider patient for right femoral-popliteal bypass graft for limb salvage given right toe ulcer    V. Annamarie Major, M.D. [facility] [place] Office: [phone] Pager:  [phone]
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
## 2017-04-06 NOTE — Discharge Instructions (Signed)

## 2017-04-06 NOTE — Progress Notes (Signed)
Site area: LT GROIN Site Prior to Removal:  Level 0 Pressure Applied For:20 MINUTES Manual:   YES Patient Status During Pull:  AWAKE Post Pull Site:  Level 0 Post Pull Instructions Given:  YES Post Pull Pulses Present: BIL DP DOPPLER Dressing Applied:  YES Bedrest begins @ 16:30 Comments:

## 2017-04-06 NOTE — Interval H&P Note (Signed)
History and Physical Interval Note:  04/06/2017 2:57 PM  Destiny Day  has presented today for surgery, with the diagnosis of pvd - claudication  The various methods of treatment have been discussed with the patient and family. After consideration of risks, benefits and other options for treatment, the patient has consented to  Procedure(s): Lower Extremity Angiography - Right (N/A) as a surgical intervention .  The patient's history has been reviewed, patient examined, no change in status, stable for surgery.  I have reviewed the patient's chart and labs.  Questions were answered to the patient's satisfaction.     Annamarie Major

## 2017-04-07 ENCOUNTER — Telehealth: Payer: Self-pay | Admitting: Vascular Surgery

## 2017-04-07 ENCOUNTER — Encounter (HOSPITAL_COMMUNITY): Payer: Self-pay | Admitting: Surgery

## 2017-04-07 DIAGNOSIS — N2581 Secondary hyperparathyroidism of renal origin: Secondary | ICD-10-CM | POA: Diagnosis not present

## 2017-04-07 DIAGNOSIS — N186 End stage renal disease: Secondary | ICD-10-CM | POA: Diagnosis not present

## 2017-04-07 DIAGNOSIS — E1122 Type 2 diabetes mellitus with diabetic chronic kidney disease: Secondary | ICD-10-CM | POA: Diagnosis not present

## 2017-04-07 DIAGNOSIS — Z23 Encounter for immunization: Secondary | ICD-10-CM | POA: Diagnosis not present

## 2017-04-07 DIAGNOSIS — E876 Hypokalemia: Secondary | ICD-10-CM | POA: Diagnosis not present

## 2017-04-07 NOTE — Telephone Encounter (Signed)
sched appt 04/13/17 at 3:45. Lm on hm#.

## 2017-04-07 NOTE — Telephone Encounter (Signed)
-----   Message from Mena Goes, RN sent at 04/06/2017  4:16 PM EDT ----- Regarding: 1 week w/ Dr. Donnetta Hutching to discuss bypass    ----- Message ----- From: Serafina Mitchell, MD Sent: 04/06/2017   4:02 PM To: V[facility] Charge Pool  04/06/2017:  Surgeon:  Annamarie Major Procedure Performed:  1.  Ultrasound-guided access, left femoral artery  2.  Abdominal aortogram  3.  Bilateral lower extremity runoff  4.  Second order catheterization  5.  Conscious sedation (greater than 15 minutes)  Schedule patient see Dr. early within 1 week to discuss bypass for a toe ulcer

## 2017-04-08 DIAGNOSIS — F4310 Post-traumatic stress disorder, unspecified: Secondary | ICD-10-CM | POA: Diagnosis not present

## 2017-04-09 DIAGNOSIS — Z23 Encounter for immunization: Secondary | ICD-10-CM | POA: Diagnosis not present

## 2017-04-09 DIAGNOSIS — E876 Hypokalemia: Secondary | ICD-10-CM | POA: Diagnosis not present

## 2017-04-09 DIAGNOSIS — N186 End stage renal disease: Secondary | ICD-10-CM | POA: Diagnosis not present

## 2017-04-09 DIAGNOSIS — E1122 Type 2 diabetes mellitus with diabetic chronic kidney disease: Secondary | ICD-10-CM | POA: Diagnosis not present

## 2017-04-09 DIAGNOSIS — N2581 Secondary hyperparathyroidism of renal origin: Secondary | ICD-10-CM | POA: Diagnosis not present

## 2017-04-12 DIAGNOSIS — N2581 Secondary hyperparathyroidism of renal origin: Secondary | ICD-10-CM | POA: Diagnosis not present

## 2017-04-12 DIAGNOSIS — E876 Hypokalemia: Secondary | ICD-10-CM | POA: Diagnosis not present

## 2017-04-12 DIAGNOSIS — E1122 Type 2 diabetes mellitus with diabetic chronic kidney disease: Secondary | ICD-10-CM | POA: Diagnosis not present

## 2017-04-12 DIAGNOSIS — Z23 Encounter for immunization: Secondary | ICD-10-CM | POA: Diagnosis not present

## 2017-04-12 DIAGNOSIS — N186 End stage renal disease: Secondary | ICD-10-CM | POA: Diagnosis not present

## 2017-04-13 ENCOUNTER — Ambulatory Visit (INDEPENDENT_AMBULATORY_CARE_PROVIDER_SITE_OTHER): Payer: Medicare Other | Admitting: Vascular Surgery

## 2017-04-13 ENCOUNTER — Encounter: Payer: Self-pay | Admitting: Vascular Surgery

## 2017-04-13 VITALS — BP 160/70 | HR 94 | Resp 18 | Ht 64.5 in | Wt 151.0 lb

## 2017-04-13 DIAGNOSIS — I739 Peripheral vascular disease, unspecified: Secondary | ICD-10-CM

## 2017-04-13 DIAGNOSIS — I70262 Atherosclerosis of native arteries of extremities with gangrene, left leg: Secondary | ICD-10-CM

## 2017-04-13 NOTE — Progress Notes (Signed)
HISTORY AND PHYSICAL     CC:  Discuss surgical options Requesting Provider:  Velna Hatchet, MD  HPI: This is a 66 y.o. female with a hx of left toe wound, which has healed after iliac intervention and femoral popliteal bypass graft.  She was seen in the office and found to have a new wound on her right toe.  She was recommended for arteriography, which was completed on 04/06/17.  She presents today to discuss options with Dr. Donnetta Hutching.   She inquires about her right arm fistula/left arm graft and finger sticks for blood glucose checks.  Past Medical History:  Diagnosis Date  . Abdominal bruit   . Anemia   . Anxiety   . Arthritis    Osteoarthritis  . Asthma   . Cervical disc disease    "pinced nerve"  . CHF (congestive heart failure) ([place])   . Complication of anesthesia    " after I got home from my last procedure, I started itching."  . Diabetes mellitus    insulin dependent  . Diverticulitis   . ESRD on peritoneal dialysis [facility])    Hemodialysis - MWF  . GERD (gastroesophageal reflux disease)    from medications  . GI bleed 03/31/2013  . History of hiatal hernia   . Hyperlipidemia   . Hypertension   . Osteoporosis   . Pneumonia   . Seasonal allergies   . Shortness of breath dyspnea    WIth exertion  . Sleep apnea    can't afford cpap    Past Surgical History:  Procedure Laterality Date  . A/V SHUNTOGRAM N/A 09/22/2016   Procedure: A/V Shuntogram - left arm;  Surgeon: Serafina Mitchell, MD;  Location: [place] [facility];  Service: Cardiovascular;  Laterality: N/A;  . ABDOMINAL HYSTERECTOMY  1993`  . AMPUTATION Left 09/01/2016   Procedure: LEFT FOOT TRANSMETATARSAL AMPUTATION;  Surgeon: Newt Minion, MD;  Location: [place];  Service: Orthopedics;  Laterality: Left;  . AV FISTULA PLACEMENT Left 04/21/2016   Procedure: INSERTION OF ARTERIOVENOUS (AV) GORE-TEX GRAFT ARM LEFT;  Surgeon: Elam Dutch, MD;  Location: [place];  Service: Vascular;  Laterality: Left;  . [place] TRANSPOSITION Left 07/10/2014   Procedure: BASCILIC VEIN TRANSPOSITION;  Surgeon: Angelia Mould, MD;  Location: [place];  Service: Vascular;  Laterality: Left;  . BASCILIC VEIN TRANSPOSITION Right 11/08/2014   Procedure: FIRST STAGE BASILIC VEIN TRANSPOSITION;  Surgeon: Angelia Mould, MD;  Location: [place];  Service: Vascular;  Laterality: Right;  . BASCILIC VEIN TRANSPOSITION Right 01/18/2015   Procedure: SECOND STAGE BASILIC VEIN TRANSPOSITION;  Surgeon: Angelia Mould, MD;  Location: [facility];  Service: Vascular;  Laterality: Right;  . ESOPHAGOGASTRODUODENOSCOPY N/A 03/31/2013   Procedure: ESOPHAGOGASTRODUODENOSCOPY (EGD);  Surgeon: Gatha Mayer, MD;  Location: [facility] ENDOSCOPY;  Service: Endoscopy;  Laterality: N/A;  . EYE SURGERY     laser surgery  . FEMORAL-POPLITEAL BYPASS GRAFT Left 07/08/2016   Procedure: LEFT  FEMORAL-BELOW KNEE POPLITEAL ARTERY BYPASS GRAFT USING 6MM X 80 CM PROPATEN GORETEX GRAFT WITH RINGS.;  Surgeon: Rosetta Posner, MD;  Location: [place];  Service: Vascular;  Laterality: Left;  . FISTULOGRAM Left 10/29/2014   Procedure: FISTULOGRAM;  Surgeon: Angelia Mould, MD;  Location: [facility] CATH LAB;  Service: Cardiovascular;  Laterality: Left;  . LIGATION OF ARTERIOVENOUS  FISTULA Left 04/21/2016   Procedure: LIGATION OF ARTERIOVENOUS  FISTULA LEFT ARM;  Surgeon: Elam Dutch, MD;  Location: [place];  Service:  Vascular;  Laterality: Left;  . LOWER EXTREMITY ANGIOGRAPHY N/A 04/06/2017   Procedure: Lower Extremity Angiography - Right;  Surgeon: Serafina Mitchell, MD;  Location: [place] [facility];  Service: Cardiovascular;  Laterality: N/A;  . PATCH ANGIOPLASTY Right 01/18/2015   Procedure: BASILIC VEIN PATCH ANGIOPLASTY USING VASCUGUARD PATCH;  Surgeon: Angelia Mould, MD;  Location: [place];  Service: Vascular;  Laterality: Right;  . PERIPHERAL VASCULAR BALLOON ANGIOPLASTY  09/22/2016   Procedure: Peripheral Vascular Balloon Angioplasty;  Surgeon: Serafina Mitchell, MD;  Location: [place] [facility];  Service: Cardiovascular;;  Lt. Fistula  . PERIPHERAL VASCULAR CATHETERIZATION N/A 06/23/2016   Procedure: Abdominal Aortogram w/Lower Extremity;  Surgeon: Serafina Mitchell, MD;  Location: [place] [facility];  Service: Cardiovascular;  Laterality: N/A;  . PERIPHERAL VASCULAR CATHETERIZATION  06/23/2016   Procedure: Peripheral Vascular Intervention;  Surgeon: Serafina Mitchell, MD;  Location: [place] [facility];  Service: Cardiovascular;;  lt common and external illiac artery    Allergies  Allergen Reactions  . Lisinopril Cough  . Prednisone Other (See Comments)    Excessive fluid buildup    Current Outpatient Prescriptions  Medication Sig Dispense Refill  . albuterol (PROVENTIL HFA;VENTOLIN HFA) 108 (90 BASE) MCG/ACT inhaler Inhale 1-2 puffs into the lungs every 6 (six) hours as needed for wheezing or shortness of breath.    Marland Kitchen amLODipine (NORVASC) 10 MG tablet Take 10 mg by mouth at bedtime.     Marland Kitchen aspirin 81 MG chewable tablet Chew 81 mg by mouth every evening.     Marland Kitchen atorvastatin (LIPITOR) 20 MG tablet Take 20 mg by mouth at bedtime.     . budesonide-formoterol (SYMBICORT) 160-4.5 MCG/ACT inhaler Inhale 1 puff into the lungs daily as needed (for respiratory issues).     . carvedilol (COREG) 12.5 MG tablet Take 12.5 mg by mouth 2 (two) times daily.     . cinacalcet (SENSIPAR) 30 MG tablet Take 30 mg by mouth daily with supper.     . clopidogrel (PLAVIX) 75 MG tablet Take 1 tablet (75 mg total) by mouth every evening. (Patient taking differently: Take 75 mg by mouth daily. )    . fluticasone (FLONASE) 50 MCG/ACT nasal spray Place 1 spray into both nostrils daily as needed for allergies or rhinitis.    Marland Kitchen insulin lispro (HUMALOG) 100 UNIT/ML injection Inject 0-8 Units into the skin 3 (three) times daily before meals. 0-59 = hypoglycemic protocol, 60-149 = 0 units, 150-250 = 5 units, 251-300 = 8 units, >301 notify MD/NP    . isosorbide-hydrALAZINE (BIDIL)  20-37.5 MG tablet Take 1 tablet by mouth 2 (two) times daily. (Patient taking differently: Take 1 tablet by mouth daily. ) 60 tablet 0  . LEVEMIR FLEXTOUCH 100 UNIT/ML Pen Inject 10 Units into the skin at bedtime.     Marland Kitchen loratadine (CLARITIN) 10 MG tablet Take 10 mg by mouth daily as needed for allergies.    . multivitamin (RENA-VIT) TABS tablet Take 1 tablet by mouth daily.   5  . omeprazole (PRILOSEC) 20 MG capsule Take 20 mg by mouth daily.     Marland Kitchen oxyCODONE-acetaminophen (PERCOCET) 5-325 MG tablet Take 1 tablet by mouth every 4 (four) hours as needed. (Patient taking differently: Take 1 tablet by mouth every 4 (four) hours as needed. For pain.) 13 tablet 0  . sucroferric oxyhydroxide (VELPHORO) 500 MG chewable tablet Chew 500-1,000 mg by mouth See admin instructions. Take 2 tablets (1000 mg) by mouth with meals and  1 tablet (500 mg) with snacks     No current facility-administered medications for this visit.     Family History  Problem Relation Age of Onset  . Other Mother        not sure of cause of death  . Diabetes Father   . Pancreatic cancer Maternal Grandmother   . Colon cancer Neg Hx     Social History   Social History  . Marital status: Married    Spouse name: [name]  . Number of children: 4  . Years of education: some collg   Occupational History  . Retired    Social History Main Topics  . Smoking status: Former Smoker    Packs/day: 0.35    Years: 40.00    Types: Cigarettes    Quit date: 05/10/2012  . Smokeless tobacco: Never Used  . Alcohol use No  . Drug use: Yes     Comment: marijuana; quit in early 1980's  . Sexual activity: Yes   Other Topics Concern  . Not on file   Social History Narrative   Patient lives at home with spouse.   Caffeine Use: 1 cup daily     REVIEW OF SYSTEMS:   [X]  denotes positive finding, [ ]  denotes negative finding Cardiac  Comments:  Chest pain or chest pressure:    Shortness of breath upon exertion:    Short of breath  when lying flat:    Irregular heart rhythm:        Vascular    Pain in calf, thigh, or hip brought on by ambulation:    Pain in feet at night that wakes you up from your sleep:     Blood clot in your veins:    Leg swelling:         Pulmonary    Oxygen at home:    Productive cough:     Wheezing:         Neurologic    Sudden weakness in arms or legs:     Sudden numbness in arms or legs:     Sudden onset of difficulty speaking or slurred speech:    Temporary loss of vision in one eye:     Problems with dizziness:         Gastrointestinal    Blood in stool:     Vomited blood:         Genitourinary    Burning when urinating:     Blood in urine:        Psychiatric    Major depression:         Hematologic    Bleeding problems:    Problems with blood clotting too easily:        Skin    Rashes or ulcers:        Constitutional    Fever or chills:      PHYSICAL EXAMINATION:  Vitals:   04/13/17 1553  BP: (!) 160/70  Pulse: 94  Resp: 18   Vitals:   04/13/17 1553  Weight: 151 lb (68.5 kg)  Height: 5' 4.5" (1.638 m)   Body mass index is 25.52 kg/m.  General:  WDWN in NAD; vital signs documented above Gait: Not observed HENT: WNL, normocephalic Pulmonary: normal non-labored breathing Skin: without rashes Extremities:  Right upper arm fistula with +thrill; left upper arm graft with thrill        Musculoskeletal: no muscle wasting or atrophy  Neurologic: A&O X 3;  No focal weakness or  paresthesias are detected Psychiatric:  The pt has Normal affect.   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   Beta Blocker:  Yes.   Aspirin:  Yes.   ACEI:  No. ARB:  No. CCB use:  Yes Other Antiplatelet/Anticoagulant:  Yes Plavix   ASSESSMENT/PLAN:: 66 y.o. female who recently underwent aortogram that revealed flush occlusion of the right SFA with reconstitution of the below knee artery and she is here to discuss possible surgical options.    -She has an area on the right great toe that is red but does not appear infected at this time.  She is not having any pain in her right foot, only tightness across the top of the foot.  Dr. Donnetta Hutching has reviewed her arteriogram and discussed with the pt.  He has recommended watching this to see if it will heal.  He discussed that if anything changes or worsens, she will contact us.  She has been wearing 15-83mmHg compression stockings since her TMA.  Recommended to pt not to wear on the right foot as the tightness may worsen her toe.  She will wear her diabetic sock.   -as far as her dialysis access, she does have a fistula in the LUA that does have a thrill.  She states that it can not be used anymore due to pain.  She has a functioning LUA AVG.  Dr. Donnetta Hutching discussed that she can use either hand for finger sticks, but not blood pressure or needle sticks. -she will f/u in one month with Dr. Donnetta Hutching to assess right foot.  Sooner if she has problems.   Leontine Locket, PA-C [facility] [phone]  Clinic MD:  Pt seen and examined with Dr. Donnetta Hutching  I have examined the patient, reviewed and agree with above.  Curt Jews, MD 04/13/2017 5:06 PM

## 2017-04-14 DIAGNOSIS — E876 Hypokalemia: Secondary | ICD-10-CM | POA: Diagnosis not present

## 2017-04-14 DIAGNOSIS — E1122 Type 2 diabetes mellitus with diabetic chronic kidney disease: Secondary | ICD-10-CM | POA: Diagnosis not present

## 2017-04-14 DIAGNOSIS — N186 End stage renal disease: Secondary | ICD-10-CM | POA: Diagnosis not present

## 2017-04-14 DIAGNOSIS — N2581 Secondary hyperparathyroidism of renal origin: Secondary | ICD-10-CM | POA: Diagnosis not present

## 2017-04-14 DIAGNOSIS — Z23 Encounter for immunization: Secondary | ICD-10-CM | POA: Diagnosis not present

## 2017-04-16 DIAGNOSIS — N186 End stage renal disease: Secondary | ICD-10-CM | POA: Diagnosis not present

## 2017-04-16 DIAGNOSIS — Z23 Encounter for immunization: Secondary | ICD-10-CM | POA: Diagnosis not present

## 2017-04-16 DIAGNOSIS — E876 Hypokalemia: Secondary | ICD-10-CM | POA: Diagnosis not present

## 2017-04-16 DIAGNOSIS — E1122 Type 2 diabetes mellitus with diabetic chronic kidney disease: Secondary | ICD-10-CM | POA: Diagnosis not present

## 2017-04-16 DIAGNOSIS — N2581 Secondary hyperparathyroidism of renal origin: Secondary | ICD-10-CM | POA: Diagnosis not present

## 2017-04-17 ENCOUNTER — Encounter (HOSPITAL_COMMUNITY): Payer: Self-pay | Admitting: Urgent Care

## 2017-04-17 ENCOUNTER — Ambulatory Visit (HOSPITAL_COMMUNITY)
Admission: EM | Admit: 2017-04-17 | Discharge: 2017-04-17 | Disposition: A | Discharge status: Home / Self Care | Payer: Medicare Other | Attending: Urgent Care | Admitting: Urgent Care

## 2017-04-17 DIAGNOSIS — R05 Cough: Secondary | ICD-10-CM

## 2017-04-17 DIAGNOSIS — R059 Cough, unspecified: Secondary | ICD-10-CM

## 2017-04-17 DIAGNOSIS — J0100 Acute maxillary sinusitis, unspecified: Secondary | ICD-10-CM

## 2017-04-17 DIAGNOSIS — R030 Elevated blood-pressure reading, without diagnosis of hypertension: Secondary | ICD-10-CM

## 2017-04-17 DIAGNOSIS — I10 Essential (primary) hypertension: Secondary | ICD-10-CM

## 2017-04-17 MED ORDER — AMOXICILLIN 250 MG PO CAPS: 250.0000 mg | ORAL_CAPSULE | Freq: Every day | ORAL | 0 refills | Status: DC

## 2017-04-17 MED ORDER — HYDROCODONE-HOMATROPINE 5-1.5 MG/5ML PO SYRP: 5.0000 mL | ORAL_SOLUTION | Freq: Every evening | ORAL | 0 refills | Status: DC | PRN

## 2017-04-17 MED ORDER — BENZONATATE 100 MG PO CAPS: 100.0000 mg | ORAL_CAPSULE | Freq: Three times a day (TID) | ORAL | 0 refills | Status: DC | PRN

## 2017-04-17 NOTE — ED Provider Notes (Signed)
MRN: [id number] DOB: 1951-05-31  Subjective:   Destiny Day is a 66 y.o. female presenting for chief complaint of URI  Reports 10 day history of runny nose, nasal congestion, post-nasal drainage, dry cough worse at night interrupting sleep. Denies fever, chest pain, chest tightness, shob, wheezing, n/v, abdominal pain, rashes, sinus pain, ear pain, ear drainage, sore throat. Patient is a dialysis patient, has a difficult time with the cold air in the room during dialysis. Patient has not taken her BP medications today, had dialysis yesterday and forgot to take it this morning. Has diagnosis of asthma, managed with Symbicort daily. Has not needed her albuterol inhaler. Denies smoking cigarettes.  No current facility-administered medications for this encounter.   Current Outpatient Prescriptions:  .  albuterol (PROVENTIL HFA;VENTOLIN HFA) 108 (90 BASE) MCG/ACT inhaler, Inhale 1-2 puffs into the lungs every 6 (six) hours as needed for wheezing or shortness of breath., Disp: , Rfl:  .  amLODipine (NORVASC) 10 MG tablet, Take 10 mg by mouth at bedtime. , Disp: , Rfl:  .  aspirin 81 MG chewable tablet, Chew 81 mg by mouth every evening. , Disp: , Rfl:  .  atorvastatin (LIPITOR) 20 MG tablet, Take 20 mg by mouth at bedtime. , Disp: , Rfl:  .  budesonide-formoterol (SYMBICORT) 160-4.5 MCG/ACT inhaler, Inhale 1 puff into the lungs daily as needed (for respiratory issues). , Disp: , Rfl:  .  carvedilol (COREG) 12.5 MG tablet, Take 12.5 mg by mouth 2 (two) times daily. , Disp: , Rfl:  .  cinacalcet (SENSIPAR) 30 MG tablet, Take 30 mg by mouth daily with supper. , Disp: , Rfl:  .  clopidogrel (PLAVIX) 75 MG tablet, Take 1 tablet (75 mg total) by mouth every evening. (Patient taking differently: Take 75 mg by mouth daily. ), Disp: , Rfl:  .  fluticasone (FLONASE) 50 MCG/ACT nasal spray, Place 1 spray into both nostrils daily as needed for allergies or rhinitis., Disp: , Rfl:  .  insulin lispro (HUMALOG)  100 UNIT/ML injection, Inject 0-8 Units into the skin 3 (three) times daily before meals. 0-59 = hypoglycemic protocol, 60-149 = 0 units, 150-250 = 5 units, 251-300 = 8 units, >301 notify MD/NP, Disp: , Rfl:  .  isosorbide-hydrALAZINE (BIDIL) 20-37.5 MG tablet, Take 1 tablet by mouth 2 (two) times daily. (Patient taking differently: Take 1 tablet by mouth daily. ), Disp: 60 tablet, Rfl: 0 .  LEVEMIR FLEXTOUCH 100 UNIT/ML Pen, Inject 10 Units into the skin at bedtime. , Disp: , Rfl:  .  loratadine (CLARITIN) 10 MG tablet, Take 10 mg by mouth daily as needed for allergies., Disp: , Rfl:  .  multivitamin (RENA-VIT) TABS tablet, Take 1 tablet by mouth daily. , Disp: , Rfl: 5 .  omeprazole (PRILOSEC) 20 MG capsule, Take 20 mg by mouth daily. , Disp: , Rfl:  .  oxyCODONE-acetaminophen (PERCOCET) 5-325 MG tablet, Take 1 tablet by mouth every 4 (four) hours as needed. (Patient taking differently: Take 1 tablet by mouth every 4 (four) hours as needed. For pain.), Disp: 13 tablet, Rfl: 0 .  sucroferric oxyhydroxide (VELPHORO) 500 MG chewable tablet, Chew 500-1,000 mg by mouth See admin instructions. Take 2 tablets (1000 mg) by mouth with meals and 1 tablet (500 mg) with snacks, Disp: , Rfl:    Destiny Day is allergic to lisinopril and prednisone.  Destiny Day  has a past medical history of Abdominal bruit; Anemia; Anxiety; Arthritis; Asthma; Cervical disc disease; CHF (congestive heart failure) ([place]); Complication of  anesthesia; Diabetes mellitus; Diverticulitis; ESRD on peritoneal dialysis [facility]); GERD (gastroesophageal reflux disease); GI bleed (03/31/2013); History of hiatal hernia; Hyperlipidemia; Hypertension; Osteoporosis; Pneumonia; Seasonal allergies; Shortness of breath dyspnea; and Sleep apnea. Also  has a past surgical history that includes Abdominal hysterectomy (1993`); Esophagogastroduodenoscopy (N/A, 03/31/2013); Eye surgery; Bascilic vein transposition (Left, 07/10/2014); Fistulogram (Left, 10/29/2014);  Bascilic vein transposition (Right, 11/08/2014); Bascilic vein transposition (Right, 01/18/2015); Patch angioplasty (Right, 01/18/2015); Ligation of arteriovenous  fistula (Left, 04/21/2016); AV fistula placement (Left, 04/21/2016); Cardiac catheterization (N/A, 06/23/2016); Cardiac catheterization (06/23/2016); Femoral-popliteal Bypass Graft (Left, 07/08/2016); Amputation (Left, 09/01/2016); A/V Shuntogram (N/A, 09/22/2016); PERIPHERAL VASCULAR BALLOON ANGIOPLASTY (09/22/2016); and Lower Extremity Angiography (N/A, 04/06/2017).  Objective:   Vitals: BP (!) 201/86 (BP Location: Right Arm) Comment (BP Location): regular cuff on forearm  Pulse 95   Temp 98.4 F (36.9 C) (Oral)   Resp 18   SpO2 99%   Physical Exam  Constitutional: She is oriented to person, place, and time. She appears well-developed and well-nourished.  HENT:  TM's intact bilaterally, no effusions or erythema. Nasal turbinates boggy, edematous with thick mucus, nasal passages minimally patent. Bilateral maxillary sinus tenderness. Oropharynx with moderate post-nasal drainage, mucous membranes moist.  Eyes: Right eye exhibits no discharge. Left eye exhibits no discharge.  Neck: Normal range of motion. Neck supple.  Cardiovascular: Normal rate, regular rhythm and intact distal pulses.  Exam reveals no gallop and no friction rub.   No murmur heard. Pulmonary/Chest: No respiratory distress. She has no wheezes. She has no rales.  Lymphadenopathy:    She has cervical adenopathy (right sided).  Neurological: She is alert and oriented to person, place, and time.  Skin: Skin is warm and dry.  Psychiatric: She has a normal mood and affect.   Assessment and Plan :   Acute maxillary sinusitis, recurrence not specified  Cough  Essential hypertension  Elevated blood pressure reading  As per UpToDate, for end-stage renal disease (ESRD) on hemodialysis, we can use 250 to 500 mg amoxicillin every 24 hours and patient is to administer dose  both during and after dialysis. I will have patient start 250mg  and recheck with her PCP.  Jaynee Eagles, PA-C [facility]  04/17/2017  4:14 PM    Jaynee Eagles, PA-C 04/17/17 [phone]

## 2017-04-17 NOTE — ED Triage Notes (Signed)
Runny nose, watery eyes, cough-onset 1 1/2 weeks ago

## 2017-04-17 NOTE — Discharge Instructions (Signed)
Please make sure you take your blood pressure medication. Check it at home and return to our clinic for a recheck if your blood pressure remains greater than 170'Y systolic.

## 2017-04-19 DIAGNOSIS — E1122 Type 2 diabetes mellitus with diabetic chronic kidney disease: Secondary | ICD-10-CM | POA: Diagnosis not present

## 2017-04-19 DIAGNOSIS — Z23 Encounter for immunization: Secondary | ICD-10-CM | POA: Diagnosis not present

## 2017-04-19 DIAGNOSIS — N186 End stage renal disease: Secondary | ICD-10-CM | POA: Diagnosis not present

## 2017-04-19 DIAGNOSIS — N2581 Secondary hyperparathyroidism of renal origin: Secondary | ICD-10-CM | POA: Diagnosis not present

## 2017-04-19 DIAGNOSIS — E876 Hypokalemia: Secondary | ICD-10-CM | POA: Diagnosis not present

## 2017-04-21 DIAGNOSIS — E876 Hypokalemia: Secondary | ICD-10-CM | POA: Diagnosis not present

## 2017-04-21 DIAGNOSIS — Z23 Encounter for immunization: Secondary | ICD-10-CM | POA: Diagnosis not present

## 2017-04-21 DIAGNOSIS — E1122 Type 2 diabetes mellitus with diabetic chronic kidney disease: Secondary | ICD-10-CM | POA: Diagnosis not present

## 2017-04-21 DIAGNOSIS — N186 End stage renal disease: Secondary | ICD-10-CM | POA: Diagnosis not present

## 2017-04-21 DIAGNOSIS — N2581 Secondary hyperparathyroidism of renal origin: Secondary | ICD-10-CM | POA: Diagnosis not present

## 2017-04-23 DIAGNOSIS — Z23 Encounter for immunization: Secondary | ICD-10-CM | POA: Diagnosis not present

## 2017-04-23 DIAGNOSIS — N186 End stage renal disease: Secondary | ICD-10-CM | POA: Diagnosis not present

## 2017-04-23 DIAGNOSIS — E1122 Type 2 diabetes mellitus with diabetic chronic kidney disease: Secondary | ICD-10-CM | POA: Diagnosis not present

## 2017-04-23 DIAGNOSIS — N2581 Secondary hyperparathyroidism of renal origin: Secondary | ICD-10-CM | POA: Diagnosis not present

## 2017-04-23 DIAGNOSIS — E876 Hypokalemia: Secondary | ICD-10-CM | POA: Diagnosis not present

## 2017-04-25 DIAGNOSIS — Z992 Dependence on renal dialysis: Secondary | ICD-10-CM | POA: Diagnosis not present

## 2017-04-25 DIAGNOSIS — I159 Secondary hypertension, unspecified: Secondary | ICD-10-CM | POA: Diagnosis not present

## 2017-04-25 DIAGNOSIS — N186 End stage renal disease: Secondary | ICD-10-CM | POA: Diagnosis not present

## 2017-04-26 DIAGNOSIS — D631 Anemia in chronic kidney disease: Secondary | ICD-10-CM | POA: Diagnosis not present

## 2017-04-26 DIAGNOSIS — E1122 Type 2 diabetes mellitus with diabetic chronic kidney disease: Secondary | ICD-10-CM | POA: Diagnosis not present

## 2017-04-26 DIAGNOSIS — E876 Hypokalemia: Secondary | ICD-10-CM | POA: Diagnosis not present

## 2017-04-26 DIAGNOSIS — N186 End stage renal disease: Secondary | ICD-10-CM | POA: Diagnosis not present

## 2017-04-26 DIAGNOSIS — N2581 Secondary hyperparathyroidism of renal origin: Secondary | ICD-10-CM | POA: Diagnosis not present

## 2017-04-28 DIAGNOSIS — E1122 Type 2 diabetes mellitus with diabetic chronic kidney disease: Secondary | ICD-10-CM | POA: Diagnosis not present

## 2017-04-28 DIAGNOSIS — D631 Anemia in chronic kidney disease: Secondary | ICD-10-CM | POA: Diagnosis not present

## 2017-04-28 DIAGNOSIS — N2581 Secondary hyperparathyroidism of renal origin: Secondary | ICD-10-CM | POA: Diagnosis not present

## 2017-04-28 DIAGNOSIS — E876 Hypokalemia: Secondary | ICD-10-CM | POA: Diagnosis not present

## 2017-04-28 DIAGNOSIS — N186 End stage renal disease: Secondary | ICD-10-CM | POA: Diagnosis not present

## 2017-04-30 DIAGNOSIS — N186 End stage renal disease: Secondary | ICD-10-CM | POA: Diagnosis not present

## 2017-04-30 DIAGNOSIS — N2581 Secondary hyperparathyroidism of renal origin: Secondary | ICD-10-CM | POA: Diagnosis not present

## 2017-04-30 DIAGNOSIS — E1122 Type 2 diabetes mellitus with diabetic chronic kidney disease: Secondary | ICD-10-CM | POA: Diagnosis not present

## 2017-04-30 DIAGNOSIS — E876 Hypokalemia: Secondary | ICD-10-CM | POA: Diagnosis not present

## 2017-04-30 DIAGNOSIS — D631 Anemia in chronic kidney disease: Secondary | ICD-10-CM | POA: Diagnosis not present

## 2017-05-03 DIAGNOSIS — N186 End stage renal disease: Secondary | ICD-10-CM | POA: Diagnosis not present

## 2017-05-03 DIAGNOSIS — E1122 Type 2 diabetes mellitus with diabetic chronic kidney disease: Secondary | ICD-10-CM | POA: Diagnosis not present

## 2017-05-03 DIAGNOSIS — D631 Anemia in chronic kidney disease: Secondary | ICD-10-CM | POA: Diagnosis not present

## 2017-05-03 DIAGNOSIS — N2581 Secondary hyperparathyroidism of renal origin: Secondary | ICD-10-CM | POA: Diagnosis not present

## 2017-05-03 DIAGNOSIS — E876 Hypokalemia: Secondary | ICD-10-CM | POA: Diagnosis not present

## 2017-05-05 DIAGNOSIS — D631 Anemia in chronic kidney disease: Secondary | ICD-10-CM | POA: Diagnosis not present

## 2017-05-05 DIAGNOSIS — F4310 Post-traumatic stress disorder, unspecified: Secondary | ICD-10-CM | POA: Diagnosis not present

## 2017-05-05 DIAGNOSIS — E1122 Type 2 diabetes mellitus with diabetic chronic kidney disease: Secondary | ICD-10-CM | POA: Diagnosis not present

## 2017-05-05 DIAGNOSIS — N186 End stage renal disease: Secondary | ICD-10-CM | POA: Diagnosis not present

## 2017-05-05 DIAGNOSIS — E876 Hypokalemia: Secondary | ICD-10-CM | POA: Diagnosis not present

## 2017-05-05 DIAGNOSIS — N2581 Secondary hyperparathyroidism of renal origin: Secondary | ICD-10-CM | POA: Diagnosis not present

## 2017-05-06 ENCOUNTER — Ambulatory Visit (INDEPENDENT_AMBULATORY_CARE_PROVIDER_SITE_OTHER): Payer: Medicare Other | Admitting: Orthopedic Surgery

## 2017-05-06 DIAGNOSIS — B351 Tinea unguium: Secondary | ICD-10-CM | POA: Diagnosis not present

## 2017-05-06 DIAGNOSIS — M79671 Pain in right foot: Secondary | ICD-10-CM

## 2017-05-06 DIAGNOSIS — Z89432 Acquired absence of left foot: Secondary | ICD-10-CM | POA: Diagnosis not present

## 2017-05-06 DIAGNOSIS — I70262 Atherosclerosis of native arteries of extremities with gangrene, left leg: Secondary | ICD-10-CM

## 2017-05-06 MED ORDER — GABAPENTIN 100 MG PO CAPS: 100.0000 mg | ORAL_CAPSULE | Freq: Every day | ORAL | 3 refills | Status: DC

## 2017-05-06 NOTE — Progress Notes (Signed)
Office Visit Note   Patient: Destiny Day           Date of Birth: [birth year]           MRN: [id number] Visit Date: 05/06/2017              Requested by: Velna Hatchet, MD [address] [place], [place] [address] PCP: Velna Hatchet, MD  Chief Complaint  Patient presents with  . Left Foot - Pain      HPI: Patient presents for follow-up for both lower extremities. She has seen vascular surgery and was told that she does have some vascular insufficiency to the right lower extremity and that her left lower extremity has good circulation status post revascularization. She complains of neuropathy pain occasionally in the left foot complains of painful onychomycotic nails in the right foot with 2 small ulcers on the right foot.  Assessment & Plan: Visit Diagnoses:  1. S/P transmetatarsal amputation of foot, left ([place])   2. Onychomycosis   3. Pain in right foot     Plan: We will see if 100 mg of Neurontin daily at bedtime helps with the neuropathic pain in the left foot. Patient will follow with vascular surgery for the right lower extremity. The nails were trimmed 5 plan to follow-up in the office in 3 months.  Follow-Up Instructions: Return in about 3 months (around 08/06/2017).   Ortho Exam  Patient is alert, oriented, no adenopathy, well-dressed, normal affect, normal respiratory effort. On examination patient has an antalgic gait. She has no open ulcers on the left transmetatarsal amputation her foot is plantigrade she has good range of motion of the ankle some mild venous stasis changes. Examination of right foot she has thick and discolored onychomycotic nails 5 she is unable safely trim the nails on her own due to her neuropathy and nails were trimmed 5 without complications. She has a very small ischemic ulcer over the tip of the great toe with a very small ischemic ulcer over the dorsum of the fifth toe. There is no cellulitis no drainage no odor no signs of  infection.  Imaging: No results found. No images are attached to the encounter.  Labs: Lab Results  Component Value Date   HGBA1C 7.5 (H) 06/24/2016   HGBA1C 9.8 (H) 10/09/2015   HGBA1C 11.3 (H) 06/13/2015   REPTSTATUS 01/16/2014 FINAL 01/10/2014   CULT  01/10/2014    NO GROWTH 5 DAYS Performed at [facility] 11/29/2008    Orders:  No orders of the defined types were placed in this encounter.  Meds ordered this encounter  Medications  . gabapentin (NEURONTIN) 100 MG capsule    Sig: Take 1 capsule (100 mg total) by mouth at bedtime. When necessary for neuropathy pain    Dispense:  30 capsule    Refill:  3     Procedures: No procedures performed  Clinical Data: No additional findings.  ROS:  All other systems negative, except as noted in the HPI. Review of Systems  Objective: Vital Signs: There were no vitals taken for this visit.  Specialty Comments:  No specialty comments available.  PMFS History: Patient Active Problem List   Diagnosis Date Noted  . Idiopathic chronic venous hypertension of both lower extremities with inflammation 10/13/2016  . S/P transmetatarsal amputation of foot, left ([place]) 09/21/2016  . Onychomycosis 08/15/2016  . Gangrene of toe of left foot ([place])   . PAD (peripheral artery disease) ([place]) 07/08/2016  .  Atherosclerosis of native artery of left lower extremity with gangrene ([place]) 06/23/2016  . GERD (gastroesophageal reflux disease) 10/07/2015  . ARF (acute renal failure) ([place])   . Hypothermia 06/12/2015  . Acute on chronic renal failure ([place]) 06/12/2015  . CHF (congestive heart failure) ([place]) 07/30/2014  . CKD (chronic kidney disease) stage 4, GFR 15-29 ml/min (HCC) 06/27/2014  . Hypertensive heart disease 05/25/2013  . CKD (chronic kidney disease) stage 3, GFR 30-59 ml/min (HCC) 05/25/2013  . Pulmonary edema 05/23/2013  . Type 2 diabetes mellitus with diabetic nephropathy ([place]) 05/23/2013    . Type 2 diabetes mellitus with hyperosmolar nonketotic hyperglycemia ([place]) 04/13/2013  . Chronic diastolic CHF (congestive heart failure) ([place]) 04/13/2013  . UGI bleed 03/31/2013  . Hypokalemia 08/24/2012  . Type II or unspecified type diabetes mellitus without mention of complication, not stated as uncontrolled 12/27/2007  . HLD (hyperlipidemia) 12/27/2007  . ALLERGIC RHINITIS 12/27/2007  . Asthma 12/27/2007   Past Medical History:  Diagnosis Date  . Abdominal bruit   . Anemia   . Anxiety   . Arthritis    Osteoarthritis  . Asthma   . Cervical disc disease    "pinced nerve"  . CHF (congestive heart failure) ([place])   . Complication of anesthesia    " after I got home from my last procedure, I started itching."  . Diabetes mellitus    insulin dependent  . Diverticulitis   . ESRD on peritoneal dialysis [facility])    Hemodialysis - MWF  . GERD (gastroesophageal reflux disease)    from medications  . GI bleed 03/31/2013  . History of hiatal hernia   . Hyperlipidemia   . Hypertension   . Osteoporosis   . Pneumonia   . Seasonal allergies   . Shortness of breath dyspnea    WIth exertion  . Sleep apnea    can't afford cpap    Family History  Problem Relation Age of Onset  . Other Mother        not sure of cause of death  . Diabetes Father   . Pancreatic cancer Maternal Grandmother   . Colon cancer Neg Hx     Past Surgical History:  Procedure Laterality Date  . A/V SHUNTOGRAM N/A 09/22/2016   Procedure: A/V Shuntogram - left arm;  Surgeon: Serafina Mitchell, MD;  Location: [facility];  Service: Cardiovascular;  Laterality: N/A;  . ABDOMINAL HYSTERECTOMY  1993`  . AMPUTATION Left 09/01/2016   Procedure: LEFT FOOT TRANSMETATARSAL AMPUTATION;  Surgeon: Newt Minion, MD;  Location: [facility];  Service: Orthopedics;  Laterality: Left;  . AV FISTULA PLACEMENT Left 04/21/2016   Procedure: INSERTION OF ARTERIOVENOUS (AV) GORE-TEX GRAFT ARM LEFT;  Surgeon: Elam Dutch, MD;   Location: [facility];  Service: Vascular;  Laterality: Left;  . [place] TRANSPOSITION Left 07/10/2014   Procedure: BASCILIC VEIN TRANSPOSITION;  Surgeon: Angelia Mould, MD;  Location: [facility];  Service: Vascular;  Laterality: Left;  . BASCILIC VEIN TRANSPOSITION Right 11/08/2014   Procedure: FIRST STAGE BASILIC VEIN TRANSPOSITION;  Surgeon: Angelia Mould, MD;  Location: [facility];  Service: Vascular;  Laterality: Right;  . BASCILIC VEIN TRANSPOSITION Right 01/18/2015   Procedure: SECOND STAGE BASILIC VEIN TRANSPOSITION;  Surgeon: Angelia Mould, MD;  [facility];  Service: Vascular;  Laterality: Right;  . ESOPHAGOGASTRODUODENOSCOPY N/A 03/31/2013   Procedure: ESOPHAGOGASTRODUODENOSCOPY (EGD);  Surgeon: Gatha Mayer, MD;  Location: [facility] ENDOSCOPY;  Service: Endoscopy;  Laterality: N/A;  . EYE  SURGERY     laser surgery  . FEMORAL-POPLITEAL BYPASS GRAFT Left 07/08/2016   Procedure: LEFT  FEMORAL-BELOW KNEE POPLITEAL ARTERY BYPASS GRAFT USING 6MM X 80 CM PROPATEN GORETEX GRAFT WITH RINGS.;  Surgeon: Rosetta Posner, MD;  Location: [facility];  Service: Vascular;  Laterality: Left;  . FISTULOGRAM Left 10/29/2014   Procedure: FISTULOGRAM;  Surgeon: Angelia Mould, MD;  Location: [facility] CATH LAB;  Service: Cardiovascular;  Laterality: Left;  . LIGATION OF ARTERIOVENOUS  FISTULA Left 04/21/2016   Procedure: LIGATION OF ARTERIOVENOUS  FISTULA LEFT ARM;  Surgeon: Elam Dutch, MD;  Location: [place];  Service: Vascular;  Laterality: Left;  . LOWER EXTREMITY ANGIOGRAPHY N/A 04/06/2017   Procedure: Lower Extremity Angiography - Right;  Surgeon: Serafina Mitchell, MD;  Location: [facility];  Service: Cardiovascular;  Laterality: N/A;  . PATCH ANGIOPLASTY Right 01/18/2015   Procedure: BASILIC VEIN PATCH ANGIOPLASTY USING VASCUGUARD PATCH;  Surgeon: Angelia Mould, MD;  Location: [facility];  Service: Vascular;  Laterality: Right;  . PERIPHERAL VASCULAR BALLOON ANGIOPLASTY  09/22/2016    Procedure: Peripheral Vascular Balloon Angioplasty;  Surgeon: Serafina Mitchell, MD;  Location: [facility];  Service: Cardiovascular;;  Lt. Fistula  . PERIPHERAL VASCULAR CATHETERIZATION N/A 06/23/2016   Procedure: Abdominal Aortogram w/Lower Extremity;  Surgeon: Serafina Mitchell, MD;  Location: [place] [facility];  Service: Cardiovascular;  Laterality: N/A;  . PERIPHERAL VASCULAR CATHETERIZATION  06/23/2016   Procedure: Peripheral Vascular Intervention;  Surgeon: Serafina Mitchell, MD;  Location: [place] [facility];  Service: Cardiovascular;;  lt common and external illiac artery   Social History   Occupational History  . Retired    Social History Main Topics  . Smoking status: Former Smoker    Packs/day: 0.35    Years: 40.00    Types: Cigarettes    Quit date: 05/10/2012  . Smokeless tobacco: Never Used  . Alcohol use No  . Drug use: Yes     Comment: marijuana; quit in early 1980's  . Sexual activity: Yes

## 2017-05-07 DIAGNOSIS — N186 End stage renal disease: Secondary | ICD-10-CM | POA: Diagnosis not present

## 2017-05-07 DIAGNOSIS — D631 Anemia in chronic kidney disease: Secondary | ICD-10-CM | POA: Diagnosis not present

## 2017-05-07 DIAGNOSIS — E1122 Type 2 diabetes mellitus with diabetic chronic kidney disease: Secondary | ICD-10-CM | POA: Diagnosis not present

## 2017-05-07 DIAGNOSIS — N2581 Secondary hyperparathyroidism of renal origin: Secondary | ICD-10-CM | POA: Diagnosis not present

## 2017-05-07 DIAGNOSIS — E876 Hypokalemia: Secondary | ICD-10-CM | POA: Diagnosis not present

## 2017-05-09 ENCOUNTER — Ambulatory Visit (HOSPITAL_COMMUNITY)
Admission: EM | Admit: 2017-05-09 | Discharge: 2017-05-09 | Disposition: A | Discharge status: Home / Self Care | Payer: Medicare Other | Attending: Family Medicine | Admitting: Family Medicine

## 2017-05-09 ENCOUNTER — Encounter (HOSPITAL_COMMUNITY): Payer: Self-pay | Admitting: Family Medicine

## 2017-05-09 ENCOUNTER — Ambulatory Visit (INDEPENDENT_AMBULATORY_CARE_PROVIDER_SITE_OTHER): Payer: Medicare Other

## 2017-05-09 DIAGNOSIS — M1120 Other chondrocalcinosis, unspecified site: Secondary | ICD-10-CM | POA: Diagnosis not present

## 2017-05-09 DIAGNOSIS — M25561 Pain in right knee: Secondary | ICD-10-CM | POA: Diagnosis not present

## 2017-05-09 DIAGNOSIS — M25461 Effusion, right knee: Secondary | ICD-10-CM | POA: Diagnosis not present

## 2017-05-09 MED ORDER — METHYLPREDNISOLONE ACETATE 80 MG/ML IJ SUSP
INTRAMUSCULAR | Status: AC
  Filled 2017-05-09: qty 1

## 2017-05-09 MED ORDER — BUPIVACAINE HCL 0.5 % IJ SOLN
50.0000 mL | Freq: Once | INTRAMUSCULAR | Status: AC
  Administered 2017-05-09: 50 mL

## 2017-05-09 MED ORDER — LIDOCAINE HCL 2 % IJ SOLN
INTRAMUSCULAR | Status: AC
  Filled 2017-05-09: qty 20

## 2017-05-09 MED ORDER — METHYLPREDNISOLONE ACETATE 80 MG/ML IJ SUSP
80.0000 mg | Freq: Once | INTRAMUSCULAR | Status: AC
  Administered 2017-05-09: 80 mg via INTRAMUSCULAR

## 2017-05-09 MED ORDER — HYDROCODONE-ACETAMINOPHEN 5-325 MG PO TABS: 1.0000 | ORAL_TABLET | Freq: Four times a day (QID) | ORAL | 0 refills | Status: DC | PRN

## 2017-05-09 NOTE — ED Provider Notes (Signed)
[facility]   [id number] 05/09/17 Arrival Time: 52   SUBJECTIVE:  Destiny Day is a 66 y.o. female who presents to the urgent care with complaint of right knee pain.  It began spontaneously after dialysis on Friday.  Patient has h/o torn cartilage in that knee.  Other relevant problems include atherosclerotic vascular disease, osteoarthritis, sleep apnea, ESRD, and diabetes.   Patient states that her diabetes has been relatively well controlled with a fasting blood sugar this morning of 124   Past Medical History:  Diagnosis Date  . Abdominal bruit   . Anemia   . Anxiety   . Arthritis    Osteoarthritis  . Asthma   . Cervical disc disease    "pinced nerve"  . CHF (congestive heart failure) ([place])   . Complication of anesthesia    " after I got home from my last procedure, I started itching."  . Diabetes mellitus    insulin dependent  . Diverticulitis   . ESRD on peritoneal dialysis [facility])    Hemodialysis - MWF  . GERD (gastroesophageal reflux disease)    from medications  . GI bleed 03/31/2013  . History of hiatal hernia   . Hyperlipidemia   . Hypertension   . Osteoporosis   . Pneumonia   . Seasonal allergies   . Shortness of breath dyspnea    WIth exertion  . Sleep apnea    can't afford cpap   Family History  Problem Relation Age of Onset  . Other Mother        not sure of cause of death  . Diabetes Father   . Pancreatic cancer Maternal Grandmother   . Colon cancer Neg Hx    Social History   Social History  . Marital status: Married    Spouse name: [name]  . Number of children: 4  . Years of education: some collg   Occupational History  . Retired    Social History Main Topics  . Smoking status: Former Smoker    Packs/day: 0.35    Years: 40.00    Types: Cigarettes    Quit date: 05/10/2012  . Smokeless tobacco: Never Used  . Alcohol use No  . Drug use: Yes     Comment: marijuana; quit in early 1980's  . Sexual activity:  Yes   Other Topics Concern  . Not on file   Social History Narrative   Patient lives at home with spouse.   Caffeine Use: 1 cup daily   Current Meds  Medication Sig  . albuterol (PROVENTIL HFA;VENTOLIN HFA) 108 (90 BASE) MCG/ACT inhaler Inhale 1-2 puffs into the lungs every 6 (six) hours as needed for wheezing or shortness of breath.  Marland Kitchen amLODipine (NORVASC) 10 MG tablet Take 10 mg by mouth at bedtime.   Marland Kitchen aspirin 81 MG chewable tablet Chew 81 mg by mouth every evening.   . benzonatate (TESSALON) 100 MG capsule Take 1-2 capsules (100-200 mg total) by mouth 3 (three) times daily as needed for cough.  . budesonide-formoterol (SYMBICORT) 160-4.5 MCG/ACT inhaler Inhale 1 puff into the lungs daily as needed (for respiratory issues).   . carvedilol (COREG) 12.5 MG tablet Take 12.5 mg by mouth 2 (two) times daily.   . cinacalcet (SENSIPAR) 30 MG tablet Take 30 mg by mouth daily with supper.   . clopidogrel (PLAVIX) 75 MG tablet Take 1 tablet (75 mg total) by mouth every evening. (Patient taking differently: Take 75 mg by mouth daily. )  . gabapentin (  NEURONTIN) 100 MG capsule Take 1 capsule (100 mg total) by mouth at bedtime. When necessary for neuropathy pain  . insulin lispro (HUMALOG) 100 UNIT/ML injection Inject 0-8 Units into the skin 3 (three) times daily before meals. 0-59 = hypoglycemic protocol, 60-149 = 0 units, 150-250 = 5 units, 251-300 = 8 units, >301 notify MD/NP  . isosorbide-hydrALAZINE (BIDIL) 20-37.5 MG tablet Take 1 tablet by mouth 2 (two) times daily. (Patient taking differently: Take 1 tablet by mouth daily. )  . LEVEMIR FLEXTOUCH 100 UNIT/ML Pen Inject 10 Units into the skin at bedtime.   . multivitamin (RENA-VIT) TABS tablet Take 1 tablet by mouth daily.   Marland Kitchen omeprazole (PRILOSEC) 20 MG capsule Take 20 mg by mouth daily.   . sucroferric oxyhydroxide (VELPHORO) 500 MG chewable tablet Chew 500-1,000 mg by mouth See admin instructions. Take 2 tablets (1000 mg) by mouth with meals  and 1 tablet (500 mg) with snacks  . [DISCONTINUED] Cinacalcet HCl (SENSIPAR PO) Take by mouth.  . [DISCONTINUED] HYDROcodone-homatropine (HYCODAN) 5-1.5 MG/5ML syrup Take 5 mLs by mouth at bedtime as needed.   Allergies  Allergen Reactions  . Lisinopril Cough  . Prednisone Other (See Comments)    Excessive fluid buildup      ROS: As per HPI, remainder of ROS negative.   OBJECTIVE:   Vitals:   05/09/17 1649  BP: (!) 173/80  Pulse: 98  Resp: 16  Temp: 98.7 F (37.1 C)  TempSrc: Oral  SpO2: 100%     General appearance: alert; no distress Eyes: PERRL; EOMI; conjunctiva normal HENT: normocephalic; atraumatic;  external ears normal without trauma; nasal mucosa normal; oral mucosa normal Neck: supple Extremities: no cyanosis or edema; symmetrical with no gross deformities Left knee shows no effusion but is quite tender along the medial joint line. Patient is able to flex it to 90 and extended to almost full extension but extreme ranges of motion precipitate pain.  Small effusion palpable Skin: warm and dry Neurologic: normal gait; grossly normal Psychological: alert and cooperative; normal mood and affect   Procedures .Joint Aspiration/Arthrocentesis Date/Time: 05/09/2017 5:44 PM Performed by: Robyn Haber Authorized by: Robyn Haber   Consent:    Consent obtained:  Verbal   Consent given by:  Patient   Risks discussed:  Infection   Alternatives discussed:  No treatment Location:    Location:  Knee   Knee:  R knee Procedure details:    Needle gauge:  59 G   Ultrasound guidance: no     Approach:  Anterior   Steroid injected: yes     Specimen collected: no   Post-procedure details:    Dressing:  Adhesive bandage   Patient tolerance of procedure:  Tolerated well, no immediate complications    Labs:  Results for orders placed or performed during the hospital encounter of 04/06/17  Glucose, capillary  Result Value Ref Range   Glucose-Capillary 100  (H) 65 - 99 mg/dL   Comment 1 Notify RN   I-STAT, chem 8  Result Value Ref Range   Sodium 137 135 - 145 mmol/L   Potassium 4.3 3.5 - 5.1 mmol/L   Chloride 97 (L) 101 - 111 mmol/L   BUN 29 (H) 6 - 20 mg/dL   Creatinine, Ser 4.80 (H) 0.44 - 1.00 mg/dL   Glucose, Bld 102 (H) 65 - 99 mg/dL   Calcium, Ion 1.00 (L) 1.15 - 1.40 mmol/L   TCO2 30 22 - 32 mmol/L   Hemoglobin 12.2 12.0 - 15.0 g/dL  HCT 36.0 36.0 - 46.0 %    Labs Reviewed - No data to display  Dg Knee Complete 4 Views Right  Result Date: 05/09/2017 CLINICAL DATA:  Pt states she was walking to her car Friday and started having pain in R knee, with no known injury, and has since started swelling. Swelling on posterior aspect of knee. History of torn cartilage in right knee. Pain is on media.*comment was truncated* EXAM: RIGHT KNEE - COMPLETE 4+ VIEW COMPARISON:  None. FINDINGS: New suprapatellar joint effusion. No fracture or dislocation. Multiple round loose bodies about the knee joint. Atherosclerotic calcification. IMPRESSION: New suprapatellar joint effusion. Electronically Signed   By: Suzy Bouchard M.D.   On: 05/09/2017 17:30     Knee x-ray:  Chondrocalcinosis, ASCVD with calcified popliteal artery  ASSESSMENT & PLAN:  1. Acute pain of right knee     Meds ordered this encounter  Medications  . DISCONTD: Cinacalcet HCl (SENSIPAR PO)    Sig: Take by mouth.  . bupivacaine (MARCAINE) 0.5 % (with pres) injection 50 mL  . methylPREDNISolone acetate (DEPO-MEDROL) injection 80 mg  . HYDROcodone-acetaminophen (NORCO) 5-325 MG tablet    Sig: Take 1 tablet by mouth every 6 (six) hours as needed for moderate pain.    Dispense:  12 tablet    Refill:  0    Reviewed expectations re: course of current medical issues. Questions answered. Outlined signs and symptoms indicating need for more acute intervention. Patient verbalized understanding. After Visit Summary given.    Procedures:      Robyn Haber,  MD 05/09/17 1745

## 2017-05-09 NOTE — ED Triage Notes (Signed)
Pt states she was walking to her car Friday and started having pain in R knee, states she hasn't hurt it at all, and it started swelling. Pt is dialysis pt, goes MWF. Pt tried to Ice knee without relief.

## 2017-05-09 NOTE — Discharge Instructions (Signed)
Follow up with your doctor or orthopedist if the pain is not improving

## 2017-05-10 DIAGNOSIS — E876 Hypokalemia: Secondary | ICD-10-CM | POA: Diagnosis not present

## 2017-05-10 DIAGNOSIS — D631 Anemia in chronic kidney disease: Secondary | ICD-10-CM | POA: Diagnosis not present

## 2017-05-10 DIAGNOSIS — N186 End stage renal disease: Secondary | ICD-10-CM | POA: Diagnosis not present

## 2017-05-10 DIAGNOSIS — E1122 Type 2 diabetes mellitus with diabetic chronic kidney disease: Secondary | ICD-10-CM | POA: Diagnosis not present

## 2017-05-10 DIAGNOSIS — N2581 Secondary hyperparathyroidism of renal origin: Secondary | ICD-10-CM | POA: Diagnosis not present

## 2017-05-12 DIAGNOSIS — N2581 Secondary hyperparathyroidism of renal origin: Secondary | ICD-10-CM | POA: Diagnosis not present

## 2017-05-12 DIAGNOSIS — N186 End stage renal disease: Secondary | ICD-10-CM | POA: Diagnosis not present

## 2017-05-12 DIAGNOSIS — E876 Hypokalemia: Secondary | ICD-10-CM | POA: Diagnosis not present

## 2017-05-12 DIAGNOSIS — D631 Anemia in chronic kidney disease: Secondary | ICD-10-CM | POA: Diagnosis not present

## 2017-05-12 DIAGNOSIS — E1122 Type 2 diabetes mellitus with diabetic chronic kidney disease: Secondary | ICD-10-CM | POA: Diagnosis not present

## 2017-05-14 DIAGNOSIS — E1122 Type 2 diabetes mellitus with diabetic chronic kidney disease: Secondary | ICD-10-CM | POA: Diagnosis not present

## 2017-05-14 DIAGNOSIS — E876 Hypokalemia: Secondary | ICD-10-CM | POA: Diagnosis not present

## 2017-05-14 DIAGNOSIS — N186 End stage renal disease: Secondary | ICD-10-CM | POA: Diagnosis not present

## 2017-05-14 DIAGNOSIS — D631 Anemia in chronic kidney disease: Secondary | ICD-10-CM | POA: Diagnosis not present

## 2017-05-14 DIAGNOSIS — N2581 Secondary hyperparathyroidism of renal origin: Secondary | ICD-10-CM | POA: Diagnosis not present

## 2017-05-17 DIAGNOSIS — N186 End stage renal disease: Secondary | ICD-10-CM | POA: Diagnosis not present

## 2017-05-17 DIAGNOSIS — E1122 Type 2 diabetes mellitus with diabetic chronic kidney disease: Secondary | ICD-10-CM | POA: Diagnosis not present

## 2017-05-17 DIAGNOSIS — E876 Hypokalemia: Secondary | ICD-10-CM | POA: Diagnosis not present

## 2017-05-17 DIAGNOSIS — N2581 Secondary hyperparathyroidism of renal origin: Secondary | ICD-10-CM | POA: Diagnosis not present

## 2017-05-17 DIAGNOSIS — D631 Anemia in chronic kidney disease: Secondary | ICD-10-CM | POA: Diagnosis not present

## 2017-05-18 ENCOUNTER — Ambulatory Visit: Payer: Medicare Other | Admitting: Vascular Surgery

## 2017-05-19 DIAGNOSIS — E1122 Type 2 diabetes mellitus with diabetic chronic kidney disease: Secondary | ICD-10-CM | POA: Diagnosis not present

## 2017-05-19 DIAGNOSIS — D631 Anemia in chronic kidney disease: Secondary | ICD-10-CM | POA: Diagnosis not present

## 2017-05-19 DIAGNOSIS — E876 Hypokalemia: Secondary | ICD-10-CM | POA: Diagnosis not present

## 2017-05-19 DIAGNOSIS — N186 End stage renal disease: Secondary | ICD-10-CM | POA: Diagnosis not present

## 2017-05-19 DIAGNOSIS — N2581 Secondary hyperparathyroidism of renal origin: Secondary | ICD-10-CM | POA: Diagnosis not present

## 2017-05-21 DIAGNOSIS — E1122 Type 2 diabetes mellitus with diabetic chronic kidney disease: Secondary | ICD-10-CM | POA: Diagnosis not present

## 2017-05-21 DIAGNOSIS — N186 End stage renal disease: Secondary | ICD-10-CM | POA: Diagnosis not present

## 2017-05-21 DIAGNOSIS — E876 Hypokalemia: Secondary | ICD-10-CM | POA: Diagnosis not present

## 2017-05-21 DIAGNOSIS — D631 Anemia in chronic kidney disease: Secondary | ICD-10-CM | POA: Diagnosis not present

## 2017-05-21 DIAGNOSIS — N2581 Secondary hyperparathyroidism of renal origin: Secondary | ICD-10-CM | POA: Diagnosis not present

## 2017-05-24 DIAGNOSIS — N2581 Secondary hyperparathyroidism of renal origin: Secondary | ICD-10-CM | POA: Diagnosis not present

## 2017-05-24 DIAGNOSIS — N186 End stage renal disease: Secondary | ICD-10-CM | POA: Diagnosis not present

## 2017-05-24 DIAGNOSIS — E876 Hypokalemia: Secondary | ICD-10-CM | POA: Diagnosis not present

## 2017-05-24 DIAGNOSIS — D631 Anemia in chronic kidney disease: Secondary | ICD-10-CM | POA: Diagnosis not present

## 2017-05-24 DIAGNOSIS — E1122 Type 2 diabetes mellitus with diabetic chronic kidney disease: Secondary | ICD-10-CM | POA: Diagnosis not present

## 2017-05-25 ENCOUNTER — Encounter: Payer: Self-pay | Admitting: Family

## 2017-05-25 ENCOUNTER — Ambulatory Visit (INDEPENDENT_AMBULATORY_CARE_PROVIDER_SITE_OTHER): Payer: Self-pay | Admitting: Family

## 2017-05-25 VITALS — BP 89/52 | HR 92 | Temp 97.0°F | Resp 18 | Ht 64.5 in | Wt 151.0 lb

## 2017-05-25 DIAGNOSIS — Z95828 Presence of other vascular implants and grafts: Secondary | ICD-10-CM

## 2017-05-25 DIAGNOSIS — Z87891 Personal history of nicotine dependence: Secondary | ICD-10-CM

## 2017-05-25 DIAGNOSIS — N186 End stage renal disease: Secondary | ICD-10-CM

## 2017-05-25 DIAGNOSIS — Z89432 Acquired absence of left foot: Secondary | ICD-10-CM

## 2017-05-25 DIAGNOSIS — I739 Peripheral vascular disease, unspecified: Secondary | ICD-10-CM

## 2017-05-25 DIAGNOSIS — Z992 Dependence on renal dialysis: Secondary | ICD-10-CM

## 2017-05-25 NOTE — Patient Instructions (Signed)
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
## 2017-05-26 DIAGNOSIS — E876 Hypokalemia: Secondary | ICD-10-CM | POA: Diagnosis not present

## 2017-05-26 DIAGNOSIS — D631 Anemia in chronic kidney disease: Secondary | ICD-10-CM | POA: Diagnosis not present

## 2017-05-26 DIAGNOSIS — E1122 Type 2 diabetes mellitus with diabetic chronic kidney disease: Secondary | ICD-10-CM | POA: Diagnosis not present

## 2017-05-26 DIAGNOSIS — N186 End stage renal disease: Secondary | ICD-10-CM | POA: Diagnosis not present

## 2017-05-26 DIAGNOSIS — N2581 Secondary hyperparathyroidism of renal origin: Secondary | ICD-10-CM | POA: Diagnosis not present

## 2017-05-26 DIAGNOSIS — Z992 Dependence on renal dialysis: Secondary | ICD-10-CM | POA: Diagnosis not present

## 2017-05-26 DIAGNOSIS — I159 Secondary hypertension, unspecified: Secondary | ICD-10-CM | POA: Diagnosis not present

## 2017-05-28 DIAGNOSIS — D509 Iron deficiency anemia, unspecified: Secondary | ICD-10-CM | POA: Diagnosis not present

## 2017-05-28 DIAGNOSIS — N186 End stage renal disease: Secondary | ICD-10-CM | POA: Diagnosis not present

## 2017-05-28 DIAGNOSIS — N2581 Secondary hyperparathyroidism of renal origin: Secondary | ICD-10-CM | POA: Diagnosis not present

## 2017-05-28 DIAGNOSIS — E876 Hypokalemia: Secondary | ICD-10-CM | POA: Diagnosis not present

## 2017-05-28 DIAGNOSIS — D631 Anemia in chronic kidney disease: Secondary | ICD-10-CM | POA: Diagnosis not present

## 2017-05-28 DIAGNOSIS — E1122 Type 2 diabetes mellitus with diabetic chronic kidney disease: Secondary | ICD-10-CM | POA: Diagnosis not present

## 2017-05-31 DIAGNOSIS — N2581 Secondary hyperparathyroidism of renal origin: Secondary | ICD-10-CM | POA: Diagnosis not present

## 2017-05-31 DIAGNOSIS — E876 Hypokalemia: Secondary | ICD-10-CM | POA: Diagnosis not present

## 2017-05-31 DIAGNOSIS — D509 Iron deficiency anemia, unspecified: Secondary | ICD-10-CM | POA: Diagnosis not present

## 2017-05-31 DIAGNOSIS — N186 End stage renal disease: Secondary | ICD-10-CM | POA: Diagnosis not present

## 2017-05-31 DIAGNOSIS — D631 Anemia in chronic kidney disease: Secondary | ICD-10-CM | POA: Diagnosis not present

## 2017-05-31 DIAGNOSIS — E1122 Type 2 diabetes mellitus with diabetic chronic kidney disease: Secondary | ICD-10-CM | POA: Diagnosis not present

## 2017-06-02 DIAGNOSIS — D631 Anemia in chronic kidney disease: Secondary | ICD-10-CM | POA: Diagnosis not present

## 2017-06-02 DIAGNOSIS — E876 Hypokalemia: Secondary | ICD-10-CM | POA: Diagnosis not present

## 2017-06-02 DIAGNOSIS — E1122 Type 2 diabetes mellitus with diabetic chronic kidney disease: Secondary | ICD-10-CM | POA: Diagnosis not present

## 2017-06-02 DIAGNOSIS — D509 Iron deficiency anemia, unspecified: Secondary | ICD-10-CM | POA: Diagnosis not present

## 2017-06-02 DIAGNOSIS — N2581 Secondary hyperparathyroidism of renal origin: Secondary | ICD-10-CM | POA: Diagnosis not present

## 2017-06-02 DIAGNOSIS — N186 End stage renal disease: Secondary | ICD-10-CM | POA: Diagnosis not present

## 2017-06-04 DIAGNOSIS — D509 Iron deficiency anemia, unspecified: Secondary | ICD-10-CM | POA: Diagnosis not present

## 2017-06-04 DIAGNOSIS — E876 Hypokalemia: Secondary | ICD-10-CM | POA: Diagnosis not present

## 2017-06-04 DIAGNOSIS — N2581 Secondary hyperparathyroidism of renal origin: Secondary | ICD-10-CM | POA: Diagnosis not present

## 2017-06-04 DIAGNOSIS — E1122 Type 2 diabetes mellitus with diabetic chronic kidney disease: Secondary | ICD-10-CM | POA: Diagnosis not present

## 2017-06-04 DIAGNOSIS — D631 Anemia in chronic kidney disease: Secondary | ICD-10-CM | POA: Diagnosis not present

## 2017-06-04 DIAGNOSIS — N186 End stage renal disease: Secondary | ICD-10-CM | POA: Diagnosis not present

## 2017-06-07 DIAGNOSIS — E1122 Type 2 diabetes mellitus with diabetic chronic kidney disease: Secondary | ICD-10-CM | POA: Diagnosis not present

## 2017-06-07 DIAGNOSIS — D631 Anemia in chronic kidney disease: Secondary | ICD-10-CM | POA: Diagnosis not present

## 2017-06-07 DIAGNOSIS — D509 Iron deficiency anemia, unspecified: Secondary | ICD-10-CM | POA: Diagnosis not present

## 2017-06-07 DIAGNOSIS — E876 Hypokalemia: Secondary | ICD-10-CM | POA: Diagnosis not present

## 2017-06-07 DIAGNOSIS — N186 End stage renal disease: Secondary | ICD-10-CM | POA: Diagnosis not present

## 2017-06-07 DIAGNOSIS — N2581 Secondary hyperparathyroidism of renal origin: Secondary | ICD-10-CM | POA: Diagnosis not present

## 2017-06-08 DIAGNOSIS — F4310 Post-traumatic stress disorder, unspecified: Secondary | ICD-10-CM | POA: Diagnosis not present

## 2017-06-09 DIAGNOSIS — E1122 Type 2 diabetes mellitus with diabetic chronic kidney disease: Secondary | ICD-10-CM | POA: Diagnosis not present

## 2017-06-09 DIAGNOSIS — E876 Hypokalemia: Secondary | ICD-10-CM | POA: Diagnosis not present

## 2017-06-09 DIAGNOSIS — D631 Anemia in chronic kidney disease: Secondary | ICD-10-CM | POA: Diagnosis not present

## 2017-06-09 DIAGNOSIS — D509 Iron deficiency anemia, unspecified: Secondary | ICD-10-CM | POA: Diagnosis not present

## 2017-06-09 DIAGNOSIS — N186 End stage renal disease: Secondary | ICD-10-CM | POA: Diagnosis not present

## 2017-06-09 DIAGNOSIS — N2581 Secondary hyperparathyroidism of renal origin: Secondary | ICD-10-CM | POA: Diagnosis not present

## 2017-06-11 DIAGNOSIS — D509 Iron deficiency anemia, unspecified: Secondary | ICD-10-CM | POA: Diagnosis not present

## 2017-06-11 DIAGNOSIS — N186 End stage renal disease: Secondary | ICD-10-CM | POA: Diagnosis not present

## 2017-06-11 DIAGNOSIS — N2581 Secondary hyperparathyroidism of renal origin: Secondary | ICD-10-CM | POA: Diagnosis not present

## 2017-06-11 DIAGNOSIS — E1122 Type 2 diabetes mellitus with diabetic chronic kidney disease: Secondary | ICD-10-CM | POA: Diagnosis not present

## 2017-06-11 DIAGNOSIS — E876 Hypokalemia: Secondary | ICD-10-CM | POA: Diagnosis not present

## 2017-06-11 DIAGNOSIS — D631 Anemia in chronic kidney disease: Secondary | ICD-10-CM | POA: Diagnosis not present

## 2017-06-13 DIAGNOSIS — E876 Hypokalemia: Secondary | ICD-10-CM | POA: Diagnosis not present

## 2017-06-13 DIAGNOSIS — D509 Iron deficiency anemia, unspecified: Secondary | ICD-10-CM | POA: Diagnosis not present

## 2017-06-13 DIAGNOSIS — N2581 Secondary hyperparathyroidism of renal origin: Secondary | ICD-10-CM | POA: Diagnosis not present

## 2017-06-13 DIAGNOSIS — E1122 Type 2 diabetes mellitus with diabetic chronic kidney disease: Secondary | ICD-10-CM | POA: Diagnosis not present

## 2017-06-13 DIAGNOSIS — D631 Anemia in chronic kidney disease: Secondary | ICD-10-CM | POA: Diagnosis not present

## 2017-06-13 DIAGNOSIS — N186 End stage renal disease: Secondary | ICD-10-CM | POA: Diagnosis not present

## 2017-06-15 DIAGNOSIS — D509 Iron deficiency anemia, unspecified: Secondary | ICD-10-CM | POA: Diagnosis not present

## 2017-06-15 DIAGNOSIS — E1122 Type 2 diabetes mellitus with diabetic chronic kidney disease: Secondary | ICD-10-CM | POA: Diagnosis not present

## 2017-06-15 DIAGNOSIS — N2581 Secondary hyperparathyroidism of renal origin: Secondary | ICD-10-CM | POA: Diagnosis not present

## 2017-06-15 DIAGNOSIS — N186 End stage renal disease: Secondary | ICD-10-CM | POA: Diagnosis not present

## 2017-06-15 DIAGNOSIS — E876 Hypokalemia: Secondary | ICD-10-CM | POA: Diagnosis not present

## 2017-06-15 DIAGNOSIS — D631 Anemia in chronic kidney disease: Secondary | ICD-10-CM | POA: Diagnosis not present

## 2017-06-18 DIAGNOSIS — N186 End stage renal disease: Secondary | ICD-10-CM | POA: Diagnosis not present

## 2017-06-18 DIAGNOSIS — D509 Iron deficiency anemia, unspecified: Secondary | ICD-10-CM | POA: Diagnosis not present

## 2017-06-18 DIAGNOSIS — E876 Hypokalemia: Secondary | ICD-10-CM | POA: Diagnosis not present

## 2017-06-18 DIAGNOSIS — D631 Anemia in chronic kidney disease: Secondary | ICD-10-CM | POA: Diagnosis not present

## 2017-06-18 DIAGNOSIS — N2581 Secondary hyperparathyroidism of renal origin: Secondary | ICD-10-CM | POA: Diagnosis not present

## 2017-06-18 DIAGNOSIS — E1122 Type 2 diabetes mellitus with diabetic chronic kidney disease: Secondary | ICD-10-CM | POA: Diagnosis not present

## 2017-06-21 DIAGNOSIS — E1122 Type 2 diabetes mellitus with diabetic chronic kidney disease: Secondary | ICD-10-CM | POA: Diagnosis not present

## 2017-06-21 DIAGNOSIS — E876 Hypokalemia: Secondary | ICD-10-CM | POA: Diagnosis not present

## 2017-06-21 DIAGNOSIS — D509 Iron deficiency anemia, unspecified: Secondary | ICD-10-CM | POA: Diagnosis not present

## 2017-06-21 DIAGNOSIS — N2581 Secondary hyperparathyroidism of renal origin: Secondary | ICD-10-CM | POA: Diagnosis not present

## 2017-06-21 DIAGNOSIS — D631 Anemia in chronic kidney disease: Secondary | ICD-10-CM | POA: Diagnosis not present

## 2017-06-21 DIAGNOSIS — N186 End stage renal disease: Secondary | ICD-10-CM | POA: Diagnosis not present

## 2017-06-23 DIAGNOSIS — D509 Iron deficiency anemia, unspecified: Secondary | ICD-10-CM | POA: Diagnosis not present

## 2017-06-23 DIAGNOSIS — D631 Anemia in chronic kidney disease: Secondary | ICD-10-CM | POA: Diagnosis not present

## 2017-06-23 DIAGNOSIS — E876 Hypokalemia: Secondary | ICD-10-CM | POA: Diagnosis not present

## 2017-06-23 DIAGNOSIS — N2581 Secondary hyperparathyroidism of renal origin: Secondary | ICD-10-CM | POA: Diagnosis not present

## 2017-06-23 DIAGNOSIS — N186 End stage renal disease: Secondary | ICD-10-CM | POA: Diagnosis not present

## 2017-06-23 DIAGNOSIS — E1122 Type 2 diabetes mellitus with diabetic chronic kidney disease: Secondary | ICD-10-CM | POA: Diagnosis not present

## 2017-06-25 DIAGNOSIS — N186 End stage renal disease: Secondary | ICD-10-CM | POA: Diagnosis not present

## 2017-06-25 DIAGNOSIS — D631 Anemia in chronic kidney disease: Secondary | ICD-10-CM | POA: Diagnosis not present

## 2017-06-25 DIAGNOSIS — E876 Hypokalemia: Secondary | ICD-10-CM | POA: Diagnosis not present

## 2017-06-25 DIAGNOSIS — E1122 Type 2 diabetes mellitus with diabetic chronic kidney disease: Secondary | ICD-10-CM | POA: Diagnosis not present

## 2017-06-25 DIAGNOSIS — D509 Iron deficiency anemia, unspecified: Secondary | ICD-10-CM | POA: Diagnosis not present

## 2017-06-25 DIAGNOSIS — N2581 Secondary hyperparathyroidism of renal origin: Secondary | ICD-10-CM | POA: Diagnosis not present

## 2017-06-25 DIAGNOSIS — Z992 Dependence on renal dialysis: Secondary | ICD-10-CM | POA: Diagnosis not present

## 2017-06-25 DIAGNOSIS — I159 Secondary hypertension, unspecified: Secondary | ICD-10-CM | POA: Diagnosis not present

## 2017-06-28 DIAGNOSIS — D631 Anemia in chronic kidney disease: Secondary | ICD-10-CM | POA: Diagnosis not present

## 2017-06-28 DIAGNOSIS — D509 Iron deficiency anemia, unspecified: Secondary | ICD-10-CM | POA: Diagnosis not present

## 2017-06-28 DIAGNOSIS — N186 End stage renal disease: Secondary | ICD-10-CM | POA: Diagnosis not present

## 2017-06-28 DIAGNOSIS — E1122 Type 2 diabetes mellitus with diabetic chronic kidney disease: Secondary | ICD-10-CM | POA: Diagnosis not present

## 2017-06-28 DIAGNOSIS — E876 Hypokalemia: Secondary | ICD-10-CM | POA: Diagnosis not present

## 2017-06-28 DIAGNOSIS — N2581 Secondary hyperparathyroidism of renal origin: Secondary | ICD-10-CM | POA: Diagnosis not present

## 2017-06-30 DIAGNOSIS — N186 End stage renal disease: Secondary | ICD-10-CM | POA: Diagnosis not present

## 2017-06-30 DIAGNOSIS — D509 Iron deficiency anemia, unspecified: Secondary | ICD-10-CM | POA: Diagnosis not present

## 2017-06-30 DIAGNOSIS — E1122 Type 2 diabetes mellitus with diabetic chronic kidney disease: Secondary | ICD-10-CM | POA: Diagnosis not present

## 2017-06-30 DIAGNOSIS — E876 Hypokalemia: Secondary | ICD-10-CM | POA: Diagnosis not present

## 2017-06-30 DIAGNOSIS — D631 Anemia in chronic kidney disease: Secondary | ICD-10-CM | POA: Diagnosis not present

## 2017-06-30 DIAGNOSIS — N2581 Secondary hyperparathyroidism of renal origin: Secondary | ICD-10-CM | POA: Diagnosis not present

## 2017-07-02 DIAGNOSIS — E876 Hypokalemia: Secondary | ICD-10-CM | POA: Diagnosis not present

## 2017-07-02 DIAGNOSIS — D509 Iron deficiency anemia, unspecified: Secondary | ICD-10-CM | POA: Diagnosis not present

## 2017-07-02 DIAGNOSIS — E1122 Type 2 diabetes mellitus with diabetic chronic kidney disease: Secondary | ICD-10-CM | POA: Diagnosis not present

## 2017-07-02 DIAGNOSIS — N186 End stage renal disease: Secondary | ICD-10-CM | POA: Diagnosis not present

## 2017-07-02 DIAGNOSIS — N2581 Secondary hyperparathyroidism of renal origin: Secondary | ICD-10-CM | POA: Diagnosis not present

## 2017-07-02 DIAGNOSIS — D631 Anemia in chronic kidney disease: Secondary | ICD-10-CM | POA: Diagnosis not present

## 2017-07-05 DIAGNOSIS — E1122 Type 2 diabetes mellitus with diabetic chronic kidney disease: Secondary | ICD-10-CM | POA: Diagnosis not present

## 2017-07-05 DIAGNOSIS — D509 Iron deficiency anemia, unspecified: Secondary | ICD-10-CM | POA: Diagnosis not present

## 2017-07-05 DIAGNOSIS — N186 End stage renal disease: Secondary | ICD-10-CM | POA: Diagnosis not present

## 2017-07-05 DIAGNOSIS — D631 Anemia in chronic kidney disease: Secondary | ICD-10-CM | POA: Diagnosis not present

## 2017-07-05 DIAGNOSIS — E876 Hypokalemia: Secondary | ICD-10-CM | POA: Diagnosis not present

## 2017-07-05 DIAGNOSIS — N2581 Secondary hyperparathyroidism of renal origin: Secondary | ICD-10-CM | POA: Diagnosis not present

## 2017-07-07 ENCOUNTER — Other Ambulatory Visit: Payer: Self-pay | Admitting: Surgery

## 2017-07-07 DIAGNOSIS — N2581 Secondary hyperparathyroidism of renal origin: Secondary | ICD-10-CM | POA: Diagnosis not present

## 2017-07-07 DIAGNOSIS — E876 Hypokalemia: Secondary | ICD-10-CM | POA: Diagnosis not present

## 2017-07-07 DIAGNOSIS — N186 End stage renal disease: Secondary | ICD-10-CM | POA: Diagnosis not present

## 2017-07-07 DIAGNOSIS — E1122 Type 2 diabetes mellitus with diabetic chronic kidney disease: Secondary | ICD-10-CM | POA: Diagnosis not present

## 2017-07-07 DIAGNOSIS — D509 Iron deficiency anemia, unspecified: Secondary | ICD-10-CM | POA: Diagnosis not present

## 2017-07-07 DIAGNOSIS — D631 Anemia in chronic kidney disease: Secondary | ICD-10-CM | POA: Diagnosis not present

## 2017-07-09 DIAGNOSIS — E876 Hypokalemia: Secondary | ICD-10-CM | POA: Diagnosis not present

## 2017-07-09 DIAGNOSIS — D509 Iron deficiency anemia, unspecified: Secondary | ICD-10-CM | POA: Diagnosis not present

## 2017-07-09 DIAGNOSIS — D631 Anemia in chronic kidney disease: Secondary | ICD-10-CM | POA: Diagnosis not present

## 2017-07-09 DIAGNOSIS — N2581 Secondary hyperparathyroidism of renal origin: Secondary | ICD-10-CM | POA: Diagnosis not present

## 2017-07-09 DIAGNOSIS — E1122 Type 2 diabetes mellitus with diabetic chronic kidney disease: Secondary | ICD-10-CM | POA: Diagnosis not present

## 2017-07-09 DIAGNOSIS — N186 End stage renal disease: Secondary | ICD-10-CM | POA: Diagnosis not present

## 2017-07-12 DIAGNOSIS — E1122 Type 2 diabetes mellitus with diabetic chronic kidney disease: Secondary | ICD-10-CM | POA: Diagnosis not present

## 2017-07-12 DIAGNOSIS — N186 End stage renal disease: Secondary | ICD-10-CM | POA: Diagnosis not present

## 2017-07-12 DIAGNOSIS — D509 Iron deficiency anemia, unspecified: Secondary | ICD-10-CM | POA: Diagnosis not present

## 2017-07-12 DIAGNOSIS — N2581 Secondary hyperparathyroidism of renal origin: Secondary | ICD-10-CM | POA: Diagnosis not present

## 2017-07-12 DIAGNOSIS — E876 Hypokalemia: Secondary | ICD-10-CM | POA: Diagnosis not present

## 2017-07-12 DIAGNOSIS — D631 Anemia in chronic kidney disease: Secondary | ICD-10-CM | POA: Diagnosis not present

## 2017-07-14 DIAGNOSIS — N186 End stage renal disease: Secondary | ICD-10-CM | POA: Diagnosis not present

## 2017-07-14 DIAGNOSIS — E1122 Type 2 diabetes mellitus with diabetic chronic kidney disease: Secondary | ICD-10-CM | POA: Diagnosis not present

## 2017-07-14 DIAGNOSIS — D631 Anemia in chronic kidney disease: Secondary | ICD-10-CM | POA: Diagnosis not present

## 2017-07-14 DIAGNOSIS — N2581 Secondary hyperparathyroidism of renal origin: Secondary | ICD-10-CM | POA: Diagnosis not present

## 2017-07-14 DIAGNOSIS — E876 Hypokalemia: Secondary | ICD-10-CM | POA: Diagnosis not present

## 2017-07-14 DIAGNOSIS — D509 Iron deficiency anemia, unspecified: Secondary | ICD-10-CM | POA: Diagnosis not present

## 2017-07-16 DIAGNOSIS — D509 Iron deficiency anemia, unspecified: Secondary | ICD-10-CM | POA: Diagnosis not present

## 2017-07-16 DIAGNOSIS — D631 Anemia in chronic kidney disease: Secondary | ICD-10-CM | POA: Diagnosis not present

## 2017-07-16 DIAGNOSIS — N2581 Secondary hyperparathyroidism of renal origin: Secondary | ICD-10-CM | POA: Diagnosis not present

## 2017-07-16 DIAGNOSIS — E876 Hypokalemia: Secondary | ICD-10-CM | POA: Diagnosis not present

## 2017-07-16 DIAGNOSIS — E1122 Type 2 diabetes mellitus with diabetic chronic kidney disease: Secondary | ICD-10-CM | POA: Diagnosis not present

## 2017-07-16 DIAGNOSIS — N186 End stage renal disease: Secondary | ICD-10-CM | POA: Diagnosis not present

## 2017-07-18 DIAGNOSIS — E876 Hypokalemia: Secondary | ICD-10-CM | POA: Diagnosis not present

## 2017-07-18 DIAGNOSIS — D509 Iron deficiency anemia, unspecified: Secondary | ICD-10-CM | POA: Diagnosis not present

## 2017-07-18 DIAGNOSIS — N186 End stage renal disease: Secondary | ICD-10-CM | POA: Diagnosis not present

## 2017-07-18 DIAGNOSIS — D631 Anemia in chronic kidney disease: Secondary | ICD-10-CM | POA: Diagnosis not present

## 2017-07-18 DIAGNOSIS — N2581 Secondary hyperparathyroidism of renal origin: Secondary | ICD-10-CM | POA: Diagnosis not present

## 2017-07-18 DIAGNOSIS — E1122 Type 2 diabetes mellitus with diabetic chronic kidney disease: Secondary | ICD-10-CM | POA: Diagnosis not present

## 2017-07-21 DIAGNOSIS — N2581 Secondary hyperparathyroidism of renal origin: Secondary | ICD-10-CM | POA: Diagnosis not present

## 2017-07-21 DIAGNOSIS — E876 Hypokalemia: Secondary | ICD-10-CM | POA: Diagnosis not present

## 2017-07-21 DIAGNOSIS — D509 Iron deficiency anemia, unspecified: Secondary | ICD-10-CM | POA: Diagnosis not present

## 2017-07-21 DIAGNOSIS — E1122 Type 2 diabetes mellitus with diabetic chronic kidney disease: Secondary | ICD-10-CM | POA: Diagnosis not present

## 2017-07-21 DIAGNOSIS — N186 End stage renal disease: Secondary | ICD-10-CM | POA: Diagnosis not present

## 2017-07-21 DIAGNOSIS — D631 Anemia in chronic kidney disease: Secondary | ICD-10-CM | POA: Diagnosis not present

## 2017-07-23 DIAGNOSIS — E1122 Type 2 diabetes mellitus with diabetic chronic kidney disease: Secondary | ICD-10-CM | POA: Diagnosis not present

## 2017-07-23 DIAGNOSIS — D509 Iron deficiency anemia, unspecified: Secondary | ICD-10-CM | POA: Diagnosis not present

## 2017-07-23 DIAGNOSIS — E876 Hypokalemia: Secondary | ICD-10-CM | POA: Diagnosis not present

## 2017-07-23 DIAGNOSIS — D631 Anemia in chronic kidney disease: Secondary | ICD-10-CM | POA: Diagnosis not present

## 2017-07-23 DIAGNOSIS — N186 End stage renal disease: Secondary | ICD-10-CM | POA: Diagnosis not present

## 2017-07-23 DIAGNOSIS — N2581 Secondary hyperparathyroidism of renal origin: Secondary | ICD-10-CM | POA: Diagnosis not present

## 2017-07-25 DIAGNOSIS — E876 Hypokalemia: Secondary | ICD-10-CM | POA: Diagnosis not present

## 2017-07-25 DIAGNOSIS — N2581 Secondary hyperparathyroidism of renal origin: Secondary | ICD-10-CM | POA: Diagnosis not present

## 2017-07-25 DIAGNOSIS — D631 Anemia in chronic kidney disease: Secondary | ICD-10-CM | POA: Diagnosis not present

## 2017-07-25 DIAGNOSIS — E1122 Type 2 diabetes mellitus with diabetic chronic kidney disease: Secondary | ICD-10-CM | POA: Diagnosis not present

## 2017-07-25 DIAGNOSIS — N186 End stage renal disease: Secondary | ICD-10-CM | POA: Diagnosis not present

## 2017-07-25 DIAGNOSIS — D509 Iron deficiency anemia, unspecified: Secondary | ICD-10-CM | POA: Diagnosis not present

## 2017-07-26 DIAGNOSIS — N186 End stage renal disease: Secondary | ICD-10-CM | POA: Diagnosis not present

## 2017-07-26 DIAGNOSIS — I159 Secondary hypertension, unspecified: Secondary | ICD-10-CM | POA: Diagnosis not present

## 2017-07-26 DIAGNOSIS — Z992 Dependence on renal dialysis: Secondary | ICD-10-CM | POA: Diagnosis not present

## 2017-07-27 DIAGNOSIS — S0990XA Unspecified injury of head, initial encounter: Secondary | ICD-10-CM

## 2017-07-27 HISTORY — DX: Unspecified injury of head, initial encounter: S09.90XA

## 2017-07-28 DIAGNOSIS — D631 Anemia in chronic kidney disease: Secondary | ICD-10-CM | POA: Diagnosis not present

## 2017-07-28 DIAGNOSIS — N186 End stage renal disease: Secondary | ICD-10-CM | POA: Diagnosis not present

## 2017-07-28 DIAGNOSIS — D509 Iron deficiency anemia, unspecified: Secondary | ICD-10-CM | POA: Diagnosis not present

## 2017-07-28 DIAGNOSIS — N2581 Secondary hyperparathyroidism of renal origin: Secondary | ICD-10-CM | POA: Diagnosis not present

## 2017-07-28 DIAGNOSIS — E876 Hypokalemia: Secondary | ICD-10-CM | POA: Diagnosis not present

## 2017-07-29 DIAGNOSIS — F4310 Post-traumatic stress disorder, unspecified: Secondary | ICD-10-CM | POA: Diagnosis not present

## 2017-07-30 DIAGNOSIS — D631 Anemia in chronic kidney disease: Secondary | ICD-10-CM | POA: Diagnosis not present

## 2017-07-30 DIAGNOSIS — E876 Hypokalemia: Secondary | ICD-10-CM | POA: Diagnosis not present

## 2017-07-30 DIAGNOSIS — N2581 Secondary hyperparathyroidism of renal origin: Secondary | ICD-10-CM | POA: Diagnosis not present

## 2017-07-30 DIAGNOSIS — N186 End stage renal disease: Secondary | ICD-10-CM | POA: Diagnosis not present

## 2017-07-30 DIAGNOSIS — D509 Iron deficiency anemia, unspecified: Secondary | ICD-10-CM | POA: Diagnosis not present

## 2017-08-02 DIAGNOSIS — N2581 Secondary hyperparathyroidism of renal origin: Secondary | ICD-10-CM | POA: Diagnosis not present

## 2017-08-02 DIAGNOSIS — E876 Hypokalemia: Secondary | ICD-10-CM | POA: Diagnosis not present

## 2017-08-02 DIAGNOSIS — N186 End stage renal disease: Secondary | ICD-10-CM | POA: Diagnosis not present

## 2017-08-02 DIAGNOSIS — D509 Iron deficiency anemia, unspecified: Secondary | ICD-10-CM | POA: Diagnosis not present

## 2017-08-02 DIAGNOSIS — D631 Anemia in chronic kidney disease: Secondary | ICD-10-CM | POA: Diagnosis not present

## 2017-08-03 ENCOUNTER — Telehealth: Payer: Self-pay

## 2017-08-03 DIAGNOSIS — E1151 Type 2 diabetes mellitus with diabetic peripheral angiopathy without gangrene: Secondary | ICD-10-CM | POA: Diagnosis not present

## 2017-08-03 DIAGNOSIS — I10 Essential (primary) hypertension: Secondary | ICD-10-CM | POA: Diagnosis not present

## 2017-08-03 DIAGNOSIS — L97509 Non-pressure chronic ulcer of other part of unspecified foot with unspecified severity: Secondary | ICD-10-CM | POA: Diagnosis not present

## 2017-08-03 DIAGNOSIS — I7389 Other specified peripheral vascular diseases: Secondary | ICD-10-CM | POA: Diagnosis not present

## 2017-08-03 DIAGNOSIS — Z6825 Body mass index (BMI) 25.0-25.9, adult: Secondary | ICD-10-CM | POA: Diagnosis not present

## 2017-08-03 DIAGNOSIS — Z794 Long term (current) use of insulin: Secondary | ICD-10-CM | POA: Diagnosis not present

## 2017-08-03 DIAGNOSIS — E7849 Other hyperlipidemia: Secondary | ICD-10-CM | POA: Diagnosis not present

## 2017-08-03 NOTE — Telephone Encounter (Signed)
Returned patient's call regarding concern with pain in her leg and toe on right foot, toe has an odor to it. We had an appointment to see NP on 08/06/17 but patient will be in dialysis. Patient has an appointment with Dr. Sharol Given on 08/06/17 at 1000. Will keep that appointment for him to assess her leg and foot and refer to [place] if needed. Instructed her to call back if needed.

## 2017-08-04 DIAGNOSIS — D631 Anemia in chronic kidney disease: Secondary | ICD-10-CM | POA: Diagnosis not present

## 2017-08-04 DIAGNOSIS — N2581 Secondary hyperparathyroidism of renal origin: Secondary | ICD-10-CM | POA: Diagnosis not present

## 2017-08-04 DIAGNOSIS — E876 Hypokalemia: Secondary | ICD-10-CM | POA: Diagnosis not present

## 2017-08-04 DIAGNOSIS — D509 Iron deficiency anemia, unspecified: Secondary | ICD-10-CM | POA: Diagnosis not present

## 2017-08-04 DIAGNOSIS — N186 End stage renal disease: Secondary | ICD-10-CM | POA: Diagnosis not present

## 2017-08-05 ENCOUNTER — Other Ambulatory Visit: Payer: Self-pay | Admitting: *Deleted

## 2017-08-05 ENCOUNTER — Encounter: Payer: Self-pay | Admitting: *Deleted

## 2017-08-05 ENCOUNTER — Ambulatory Visit (INDEPENDENT_AMBULATORY_CARE_PROVIDER_SITE_OTHER): Payer: Medicare Other | Admitting: Orthopedic Surgery

## 2017-08-05 ENCOUNTER — Encounter (INDEPENDENT_AMBULATORY_CARE_PROVIDER_SITE_OTHER): Payer: Self-pay | Admitting: Orthopedic Surgery

## 2017-08-05 ENCOUNTER — Ambulatory Visit (INDEPENDENT_AMBULATORY_CARE_PROVIDER_SITE_OTHER): Payer: Medicare Other | Admitting: Vascular Surgery

## 2017-08-05 DIAGNOSIS — I96 Gangrene, not elsewhere classified: Secondary | ICD-10-CM | POA: Diagnosis not present

## 2017-08-05 DIAGNOSIS — I739 Peripheral vascular disease, unspecified: Secondary | ICD-10-CM

## 2017-08-05 DIAGNOSIS — Z89432 Acquired absence of left foot: Secondary | ICD-10-CM

## 2017-08-05 MED ORDER — DOXYCYCLINE HYCLATE 100 MG PO TABS: 100.0000 mg | ORAL_TABLET | Freq: Two times a day (BID) | ORAL | 0 refills | Status: DC

## 2017-08-05 NOTE — Progress Notes (Signed)
Office Visit Note   Patient: Destiny Day           Date of Birth: [birth year]           MRN: [id number] Visit Date: 08/05/2017              Requested by: Velna Hatchet, MD [address] [place], [place] [address] PCP: Velna Hatchet, MD  Chief Complaint  Patient presents with  . Left Foot - Follow-up      HPI: Patient presents for evaluation for pain of the right great toe.  Patient states that it feels like it is doing the same as her left foot did prior to her left transmetatarsal amputation.  Patient is status post revascularization with vascular vein surgery to the left lower extremity.  Patient has had vascular studies in the past and her right lower extremity also had vascular disease.  Patient will require forefoot amputation surgery and this will be dependent upon her vascular workup.  Patient states that she does not want to just have the great toe amputated but she would like a transmetatarsal amputation like she has on the left foot.  Assessment & Plan: Visit Diagnoses:  1. S/P transmetatarsal amputation of foot, left (HCC)   2. Gangrene of toe of right foot ([place])     Plan: Patient will follow up with vascular vein surgery she states she does have an appointment later with Dr. early.  Prescription provided for doxycycline to use antibiotic ointment and Band-Aid.  Follow-Up Instructions: Return if symptoms worsen or fail to improve.   Ortho Exam  Patient is alert, oriented, no adenopathy, well-dressed, normal affect, normal respiratory effort. Examination patient does not have a palpable dorsalis pedis pulse.  Right foot has thin atrophic skin there are skin changes secondary to venous insufficiency she has a worsening gangrenous ulcer over the tip of the right great toe.  After informed consent a 10 blade knife was used to remove the necrotic tissue the bone is palpable silver nitrate was used for hemostasis and a Band-Aid was applied.  The gangrenous ulcer is  10 mm in diameter and 5 mm deep.  There is a foul odor there is no a sending cellulitis.  Imaging: No results found. No images are attached to the encounter.  Labs: Lab Results  Component Value Date   HGBA1C 7.5 (H) 06/24/2016   HGBA1C 9.8 (H) 10/09/2015   HGBA1C 11.3 (H) 06/13/2015   REPTSTATUS 01/16/2014 FINAL 01/10/2014   CULT  01/10/2014    NO GROWTH 5 DAYS Performed at [facility] 11/29/2008    @LABSALLVALUES (HGBA1)@  There is no height or weight on file to calculate BMI.  Orders:  No orders of the defined types were placed in this encounter.  No orders of the defined types were placed in this encounter.    Procedures: No procedures performed  Clinical Data: No additional findings.  ROS:  All other systems negative, except as noted in the HPI. Review of Systems  Objective: Vital Signs: There were no vitals taken for this visit.  Specialty Comments:  No specialty comments available.  PMFS History: Patient Active Problem List   Diagnosis Date Noted  . Idiopathic chronic venous hypertension of both lower extremities with inflammation 10/13/2016  . S/P transmetatarsal amputation of foot, left ([place]) 09/21/2016  . Onychomycosis 08/15/2016  . Gangrene of toe of left foot ([place])   . PAD (peripheral artery disease) ([place]) 07/08/2016  . Atherosclerosis of  native artery of left lower extremity with gangrene ([place]) 06/23/2016  . GERD (gastroesophageal reflux disease) 10/07/2015  . ARF (acute renal failure) ([place])   . Hypothermia 06/12/2015  . Acute on chronic renal failure ([place]) 06/12/2015  . CHF (congestive heart failure) ([place]) 07/30/2014  . CKD (chronic kidney disease) stage 4, GFR 15-29 ml/min (HCC) 06/27/2014  . Hypertensive heart disease 05/25/2013  . CKD (chronic kidney disease) stage 3, GFR 30-59 ml/min (HCC) 05/25/2013  . Pulmonary edema 05/23/2013  . Type 2 diabetes mellitus with diabetic nephropathy ([place])  05/23/2013  . Type 2 diabetes mellitus with hyperosmolar nonketotic hyperglycemia ([place]) 04/13/2013  . Chronic diastolic CHF (congestive heart failure) ([place]) 04/13/2013  . UGI bleed 03/31/2013  . Hypokalemia 08/24/2012  . Type II or unspecified type diabetes mellitus without mention of complication, not stated as uncontrolled 12/27/2007  . HLD (hyperlipidemia) 12/27/2007  . ALLERGIC RHINITIS 12/27/2007  . Asthma 12/27/2007   Past Medical History:  Diagnosis Date  . Abdominal bruit   . Anemia   . Anxiety   . Arthritis    Osteoarthritis  . Asthma   . Cervical disc disease    "pinced nerve"  . CHF (congestive heart failure) ([place])   . Complication of anesthesia    " after I got home from my last procedure, I started itching."  . Diabetes mellitus    insulin dependent  . Diverticulitis   . ESRD on peritoneal dialysis [facility])    Hemodialysis - MWF  . GERD (gastroesophageal reflux disease)    from medications  . GI bleed 03/31/2013  . History of hiatal hernia   . Hyperlipidemia   . Hypertension   . Osteoporosis   . Pneumonia   . Seasonal allergies   . Shortness of breath dyspnea    WIth exertion  . Sleep apnea    can't afford cpap    Family History  Problem Relation Age of Onset  . Other Mother        not sure of cause of death  . Diabetes Father   . Pancreatic cancer Maternal Grandmother   . Colon cancer Neg Hx     Past Surgical History:  Procedure Laterality Date  . A/V SHUNTOGRAM N/A 09/22/2016   Procedure: A/V Shuntogram - left arm;  Surgeon: Serafina Mitchell, MD;  Location: [place] CV LAB;  Service: Cardiovascular;  Laterality: N/A;  . ABDOMINAL HYSTERECTOMY  1993`  . AMPUTATION Left 09/01/2016   Procedure: LEFT FOOT TRANSMETATARSAL AMPUTATION;  Surgeon: Newt Minion, MD;  Location: [place];  Service: Orthopedics;  Laterality: Left;  . AV FISTULA PLACEMENT Left 04/21/2016   Procedure: INSERTION OF ARTERIOVENOUS (AV) GORE-TEX GRAFT ARM LEFT;  Surgeon: Elam Dutch, MD;  Location: [place];  Service: Vascular;  Laterality: Left;  . [place] TRANSPOSITION Left 07/10/2014   Procedure: BASCILIC VEIN TRANSPOSITION;  Surgeon: Angelia Mould, MD;  Location: [place];  Service: Vascular;  Laterality: Left;  . BASCILIC VEIN TRANSPOSITION Right 11/08/2014   Procedure: FIRST STAGE BASILIC VEIN TRANSPOSITION;  Surgeon: Angelia Mould, MD;  Location: [place];  Service: Vascular;  Laterality: Right;  . BASCILIC VEIN TRANSPOSITION Right 01/18/2015   Procedure: SECOND STAGE BASILIC VEIN TRANSPOSITION;  Surgeon: Angelia Mould, MD;  Location: [facility] OR;  Service: Vascular;  Laterality: Right;  . ESOPHAGOGASTRODUODENOSCOPY N/A 03/31/2013   Procedure: ESOPHAGOGASTRODUODENOSCOPY (EGD);  Surgeon: Gatha Mayer, MD;  Location: [facility] ENDOSCOPY;  Service: Endoscopy;  Laterality: N/A;  . EYE SURGERY  laser surgery  . FEMORAL-POPLITEAL BYPASS GRAFT Left 07/08/2016   Procedure: LEFT  FEMORAL-BELOW KNEE POPLITEAL ARTERY BYPASS GRAFT USING 6MM X 80 CM PROPATEN GORETEX GRAFT WITH RINGS.;  Surgeon: Rosetta Posner, MD;  Location: [place];  Service: Vascular;  Laterality: Left;  . FISTULOGRAM Left 10/29/2014   Procedure: FISTULOGRAM;  Surgeon: Angelia Mould, MD;  Location: [facility] CATH LAB;  Service: Cardiovascular;  Laterality: Left;  . LIGATION OF ARTERIOVENOUS  FISTULA Left 04/21/2016   Procedure: LIGATION OF ARTERIOVENOUS  FISTULA LEFT ARM;  Surgeon: Elam Dutch, MD;  Location: [place];  Service: Vascular;  Laterality: Left;  . LOWER EXTREMITY ANGIOGRAPHY N/A 04/06/2017   Procedure: Lower Extremity Angiography - Right;  Surgeon: Serafina Mitchell, MD;  Location: [place] CV LAB;  Service: Cardiovascular;  Laterality: N/A;  . PATCH ANGIOPLASTY Right 01/18/2015   Procedure: BASILIC VEIN PATCH ANGIOPLASTY USING VASCUGUARD PATCH;  Surgeon: Angelia Mould, MD;  Location: [place];  Service: Vascular;  Laterality: Right;  . PERIPHERAL VASCULAR BALLOON ANGIOPLASTY   09/22/2016   Procedure: Peripheral Vascular Balloon Angioplasty;  Surgeon: Serafina Mitchell, MD;  Location: [place] CV LAB;  Service: Cardiovascular;;  Lt. Fistula  . PERIPHERAL VASCULAR CATHETERIZATION N/A 06/23/2016   Procedure: Abdominal Aortogram w/Lower Extremity;  Surgeon: Serafina Mitchell, MD;  Location: [place] CV LAB;  Service: Cardiovascular;  Laterality: N/A;  . PERIPHERAL VASCULAR CATHETERIZATION  06/23/2016   Procedure: Peripheral Vascular Intervention;  Surgeon: Serafina Mitchell, MD;  Location: [place] CV LAB;  Service: Cardiovascular;;  lt common and external illiac artery   Social History   Occupational History  . Occupation: Retired  Tobacco Use  . Smoking status: Former Smoker    Packs/day: 0.35    Years: 40.00    Pack years: 14.00    Types: Cigarettes    Last attempt to quit: 05/10/2012    Years since quitting: 5.2  . Smokeless tobacco: Never Used  Substance and Sexual Activity  . Alcohol use: No  . Drug use: Yes    Comment: marijuana; quit in early 1980's  . Sexual activity: Yes

## 2017-08-06 ENCOUNTER — Other Ambulatory Visit: Payer: Self-pay | Admitting: *Deleted

## 2017-08-06 ENCOUNTER — Encounter: Payer: Self-pay | Admitting: Vascular Surgery

## 2017-08-06 DIAGNOSIS — N2581 Secondary hyperparathyroidism of renal origin: Secondary | ICD-10-CM | POA: Diagnosis not present

## 2017-08-06 DIAGNOSIS — D631 Anemia in chronic kidney disease: Secondary | ICD-10-CM | POA: Diagnosis not present

## 2017-08-06 DIAGNOSIS — D509 Iron deficiency anemia, unspecified: Secondary | ICD-10-CM | POA: Diagnosis not present

## 2017-08-06 DIAGNOSIS — N186 End stage renal disease: Secondary | ICD-10-CM | POA: Diagnosis not present

## 2017-08-06 DIAGNOSIS — E876 Hypokalemia: Secondary | ICD-10-CM | POA: Diagnosis not present

## 2017-08-06 DIAGNOSIS — I7025 Atherosclerosis of native arteries of other extremities with ulceration: Secondary | ICD-10-CM

## 2017-08-06 MED ORDER — HYDROCODONE-ACETAMINOPHEN 5-325 MG PO TABS: 1.0000 | ORAL_TABLET | Freq: Four times a day (QID) | ORAL | 0 refills | Status: DC | PRN

## 2017-08-06 NOTE — H&P (View-Only) (Signed)
Vascular and Vein Specialist of [place]  Patient name: Destiny Day MRN: [id number] DOB: [birth year] Sex: female  REASON FOR VISIT: Valuation of right leg ischemia  HPI: Destiny Day is a 67 y.o. female presents today for a gangrenous changes in her right toe.  She is well-known to me from prior similar issues with her left foot.  He underwent a femoral to below-knee popliteal bypass with prosthetic Gore-Tex graft since she did not have a usable vein and underwent a left transmetatarsal amputation with Dr. Sharol Given.  She eventually had a total healing of this.  She presents now with pain and gangrenous changes on the tip of her right great toe.  She saw Dr. Sharol Given earlier today and underwent debridement of this and was started on doxycycline.  Past Medical History:  Diagnosis Date  . Abdominal bruit   . Anemia   . Anxiety   . Arthritis    Osteoarthritis  . Asthma   . Cervical disc disease    "pinced nerve"  . CHF (congestive heart failure) ([place])   . Complication of anesthesia    " after I got home from my last procedure, I started itching."  . Diabetes mellitus    insulin dependent  . Diverticulitis   . ESRD on peritoneal dialysis [facility])    Hemodialysis - MWF  . GERD (gastroesophageal reflux disease)    from medications  . GI bleed 03/31/2013  . History of hiatal hernia   . Hyperlipidemia   . Hypertension   . Osteoporosis   . Pneumonia   . Seasonal allergies   . Shortness of breath dyspnea    WIth exertion  . Sleep apnea    can't afford cpap    Family History  Problem Relation Age of Onset  . Other Mother        not sure of cause of death  . Diabetes Father   . Pancreatic cancer Maternal Grandmother   . Colon cancer Neg Hx     SOCIAL HISTORY: Social History   Tobacco Use  . Smoking status: Former Smoker    Packs/day: 0.35    Years: 40.00    Pack years: 14.00    Types: Cigarettes    Last attempt to quit:  05/10/2012    Years since quitting: 5.2  . Smokeless tobacco: Never Used  Substance Use Topics  . Alcohol use: No    Allergies  Allergen Reactions  . Lisinopril Cough  . Prednisone Other (See Comments)    Excessive fluid buildup    Current Outpatient Medications  Medication Sig Dispense Refill  . acetaminophen (TYLENOL) 500 MG tablet Take 500 mg by mouth every 6 (six) hours as needed.    Marland Kitchen albuterol (PROVENTIL HFA;VENTOLIN HFA) 108 (90 BASE) MCG/ACT inhaler Inhale 1-2 puffs into the lungs every 6 (six) hours as needed for wheezing or shortness of breath.    Marland Kitchen amLODipine (NORVASC) 10 MG tablet Take 10 mg by mouth at bedtime.     Marland Kitchen aspirin 81 MG chewable tablet Chew 81 mg by mouth every evening.     Marland Kitchen atorvastatin (LIPITOR) 20 MG tablet Take 20 mg by mouth at bedtime.     . budesonide-formoterol (SYMBICORT) 160-4.5 MCG/ACT inhaler Inhale 1 puff into the lungs daily as needed (for respiratory issues).     . calcium acetate (PHOSLO) 667 MG capsule Take 667-1,334 mg by mouth See admin instructions. 1 tablet twice daily with snacks. And 2 tablets 3 times daily with meals    .  carvedilol (COREG) 12.5 MG tablet Take 12.5 mg by mouth 2 (two) times daily.     . cinacalcet (SENSIPAR) 30 MG tablet Take 30 mg by mouth daily with supper.     . clopidogrel (PLAVIX) 75 MG tablet TAKE 1 TABLET EVERY DAY 30 tablet 7  . diazepam (VALIUM) 5 MG tablet Take 5 mg by mouth every 12 (twelve) hours as needed for anxiety.    Marland Kitchen doxycycline (VIBRA-TABS) 100 MG tablet Take 1 tablet (100 mg total) by mouth 2 (two) times daily. 60 tablet 0  . ferric citrate (AURYXIA) 1 GM 210 MG(Fe) tablet Take 420 mg by mouth 3 (three) times daily with meals.    . fluticasone (FLONASE) 50 MCG/ACT nasal spray Place 1 spray into both nostrils daily as needed for allergies or rhinitis.    Marland Kitchen gabapentin (NEURONTIN) 100 MG capsule Take 1 capsule (100 mg total) by mouth at bedtime. When necessary for neuropathy pain 30 capsule 3  .  hydrALAZINE (APRESOLINE) 25 MG tablet Take 1 tablet by mouth See admin instructions. Take 1 tab 3 times a day on dialysis days and 1 tab twice daily on non dialysis days    . HYDROcodone-acetaminophen (NORCO) 5-325 MG tablet Take 1 tablet by mouth every 6 (six) hours as needed for moderate pain. 12 tablet 0  . insulin lispro (HUMALOG) 100 UNIT/ML injection Inject 0-8 Units into the skin 3 (three) times daily before meals. 0-59 = hypoglycemic protocol, 60-149 = 0 units, 150-250 = 5 units, 251-300 = 8 units, >301 notify MD/NP    . isosorbide dinitrate (ISORDIL) 20 MG tablet Take 1 tablet by mouth See admin instructions. Take 1 tablet daily on dialysis days and 1 tab twice daily on non dialysis days    . LEVEMIR FLEXTOUCH 100 UNIT/ML Pen Inject 10 Units into the skin at bedtime.     Marland Kitchen loratadine (CLARITIN) 10 MG tablet Take 10 mg by mouth daily as needed for allergies.    . multivitamin (RENA-VIT) TABS tablet Take 1 tablet by mouth daily.   5  . omeprazole (PRILOSEC) 20 MG capsule Take 20 mg by mouth daily.     . sucroferric oxyhydroxide (VELPHORO) 500 MG chewable tablet Chew 500-1,000 mg by mouth See admin instructions. Take 2 tablets (1000 mg) by mouth with meals and 1 tablet (500 mg) with snacks     No current facility-administered medications for this visit.     REVIEW OF SYSTEMS:  [X]  denotes positive finding, [ ]  denotes negative finding Cardiac  Comments:  Chest pain or chest pressure:    Shortness of breath upon exertion:    Short of breath when lying flat:    Irregular heart rhythm:        Vascular    Pain in calf, thigh, or hip brought on by ambulation:    Pain in feet at night that wakes you up from your sleep:  x   Blood clot in your veins:    Leg swelling:           PHYSICAL EXAM: There were no vitals filed for this visit.  GENERAL: The patient is a well-nourished female, in no acute distress. The vital signs are documented above. CARDIOVASCULAR: Palpable right femoral  pulse and absent distal pulse.  Dressing intact over great toe PULMONARY: There is good air exchange  MUSCULOSKELETAL: There are no major deformities or cyanosis. NEUROLOGIC: No focal weakness or paresthesias are detected. SKIN: There are no ulcers or rashes noted. PSYCHIATRIC: The patient  has a normal affect.  DATA:  I reviewed her prior arteriogram from a year and a half ago.  This did reveal complete occlusion of her right superficial femoral artery at its origin with reconstitution at the knee.  There was no evidence of aortoiliac inflow occlusive disease  MEDICAL ISSUES: Limb threatening ischemia with gangrenous changes to right great toe and known superficial femoral artery occlusive disease.  Have recommended formal arteriography and would recommend bypass to follow.  There does not appear to be any evidence of invasive infection.  We will expedite this is quickly as possible.  She does have hemodialysis 3 times a week and we will coordinate this around her dialysis schedule    Rosetta Posner, MD [facility] Vascular and Vein Specialists of [facility] Tel [phone] Pager [phone]

## 2017-08-06 NOTE — Progress Notes (Signed)
Vascular and Vein Specialist of [place]  Patient name: Destiny Day MRN: [id number] DOB: 07-Jan-1951 Sex: female  REASON FOR VISIT: Valuation of right leg ischemia  HPI: Destiny Day is a 67 y.o. female presents today for a gangrenous changes in her right toe.  She is well-known to me from prior similar issues with her left foot.  He underwent a femoral to below-knee popliteal bypass with prosthetic Gore-Tex graft since she did not have a usable vein and underwent a left transmetatarsal amputation with Dr. Sharol Given.  She eventually had a total healing of this.  She presents now with pain and gangrenous changes on the tip of her right great toe.  She saw Dr. Sharol Given earlier today and underwent debridement of this and was started on doxycycline.  Past Medical History:  Diagnosis Date  . Abdominal bruit   . Anemia   . Anxiety   . Arthritis    Osteoarthritis  . Asthma   . Cervical disc disease    "pinced nerve"  . CHF (congestive heart failure) ([place])   . Complication of anesthesia    " after I got home from my last procedure, I started itching."  . Diabetes mellitus    insulin dependent  . Diverticulitis   . ESRD on peritoneal dialysis [facility])    Hemodialysis - MWF  . GERD (gastroesophageal reflux disease)    from medications  . GI bleed 03/31/2013  . History of hiatal hernia   . Hyperlipidemia   . Hypertension   . Osteoporosis   . Pneumonia   . Seasonal allergies   . Shortness of breath dyspnea    WIth exertion  . Sleep apnea    can't afford cpap    Family History  Problem Relation Age of Onset  . Other Mother        not sure of cause of death  . Diabetes Father   . Pancreatic cancer Maternal Grandmother   . Colon cancer Neg Hx     SOCIAL HISTORY: Social History   Tobacco Use  . Smoking status: Former Smoker    Packs/day: 0.35    Years: 40.00    Pack years: 14.00    Types: Cigarettes    Last attempt to quit:  05/10/2012    Years since quitting: 5.2  . Smokeless tobacco: Never Used  Substance Use Topics  . Alcohol use: No    Allergies  Allergen Reactions  . Lisinopril Cough  . Prednisone Other (See Comments)    Excessive fluid buildup    Current Outpatient Medications  Medication Sig Dispense Refill  . acetaminophen (TYLENOL) 500 MG tablet Take 500 mg by mouth every 6 (six) hours as needed.    Marland Kitchen albuterol (PROVENTIL HFA;VENTOLIN HFA) 108 (90 BASE) MCG/ACT inhaler Inhale 1-2 puffs into the lungs every 6 (six) hours as needed for wheezing or shortness of breath.    Marland Kitchen amLODipine (NORVASC) 10 MG tablet Take 10 mg by mouth at bedtime.     Marland Kitchen aspirin 81 MG chewable tablet Chew 81 mg by mouth every evening.     Marland Kitchen atorvastatin (LIPITOR) 20 MG tablet Take 20 mg by mouth at bedtime.     . budesonide-formoterol (SYMBICORT) 160-4.5 MCG/ACT inhaler Inhale 1 puff into the lungs daily as needed (for respiratory issues).     . calcium acetate (PHOSLO) 667 MG capsule Take 667-1,334 mg by mouth See admin instructions. 1 tablet twice daily with snacks. And 2 tablets 3 times daily with meals    .  carvedilol (COREG) 12.5 MG tablet Take 12.5 mg by mouth 2 (two) times daily.     . cinacalcet (SENSIPAR) 30 MG tablet Take 30 mg by mouth daily with supper.     . clopidogrel (PLAVIX) 75 MG tablet TAKE 1 TABLET EVERY DAY 30 tablet 7  . diazepam (VALIUM) 5 MG tablet Take 5 mg by mouth every 12 (twelve) hours as needed for anxiety.    Marland Kitchen doxycycline (VIBRA-TABS) 100 MG tablet Take 1 tablet (100 mg total) by mouth 2 (two) times daily. 60 tablet 0  . ferric citrate (AURYXIA) 1 GM 210 MG(Fe) tablet Take 420 mg by mouth 3 (three) times daily with meals.    . fluticasone (FLONASE) 50 MCG/ACT nasal spray Place 1 spray into both nostrils daily as needed for allergies or rhinitis.    Marland Kitchen gabapentin (NEURONTIN) 100 MG capsule Take 1 capsule (100 mg total) by mouth at bedtime. When necessary for neuropathy pain 30 capsule 3  .  hydrALAZINE (APRESOLINE) 25 MG tablet Take 1 tablet by mouth See admin instructions. Take 1 tab 3 times a day on dialysis days and 1 tab twice daily on non dialysis days    . HYDROcodone-acetaminophen (NORCO) 5-325 MG tablet Take 1 tablet by mouth every 6 (six) hours as needed for moderate pain. 12 tablet 0  . insulin lispro (HUMALOG) 100 UNIT/ML injection Inject 0-8 Units into the skin 3 (three) times daily before meals. 0-59 = hypoglycemic protocol, 60-149 = 0 units, 150-250 = 5 units, 251-300 = 8 units, >301 notify MD/NP    . isosorbide dinitrate (ISORDIL) 20 MG tablet Take 1 tablet by mouth See admin instructions. Take 1 tablet daily on dialysis days and 1 tab twice daily on non dialysis days    . LEVEMIR FLEXTOUCH 100 UNIT/ML Pen Inject 10 Units into the skin at bedtime.     Marland Kitchen loratadine (CLARITIN) 10 MG tablet Take 10 mg by mouth daily as needed for allergies.    . multivitamin (RENA-VIT) TABS tablet Take 1 tablet by mouth daily.   5  . omeprazole (PRILOSEC) 20 MG capsule Take 20 mg by mouth daily.     . sucroferric oxyhydroxide (VELPHORO) 500 MG chewable tablet Chew 500-1,000 mg by mouth See admin instructions. Take 2 tablets (1000 mg) by mouth with meals and 1 tablet (500 mg) with snacks     No current facility-administered medications for this visit.     REVIEW OF SYSTEMS:  [X]  denotes positive finding, [ ]  denotes negative finding Cardiac  Comments:  Chest pain or chest pressure:    Shortness of breath upon exertion:    Short of breath when lying flat:    Irregular heart rhythm:        Vascular    Pain in calf, thigh, or hip brought on by ambulation:    Pain in feet at night that wakes you up from your sleep:  x   Blood clot in your veins:    Leg swelling:           PHYSICAL EXAM: There were no vitals filed for this visit.  GENERAL: The patient is a well-nourished female, in no acute distress. The vital signs are documented above. CARDIOVASCULAR: Palpable right femoral  pulse and absent distal pulse.  Dressing intact over great toe PULMONARY: There is good air exchange  MUSCULOSKELETAL: There are no major deformities or cyanosis. NEUROLOGIC: No focal weakness or paresthesias are detected. SKIN: There are no ulcers or rashes noted. PSYCHIATRIC: The patient  has a normal affect.  DATA:  I reviewed her prior arteriogram from a year and a half ago.  This did reveal complete occlusion of her right superficial femoral artery at its origin with reconstitution at the knee.  There was no evidence of aortoiliac inflow occlusive disease  MEDICAL ISSUES: Limb threatening ischemia with gangrenous changes to right great toe and known superficial femoral artery occlusive disease.  Have recommended formal arteriography and would recommend bypass to follow.  There does not appear to be any evidence of invasive infection.  We will expedite this is quickly as possible.  She does have hemodialysis 3 times a week and we will coordinate this around her dialysis schedule    Rosetta Posner, MD [facility] Vascular and Vein Specialists of [facility] Tel [phone] Pager [phone]

## 2017-08-06 NOTE — Progress Notes (Signed)
Aortogram cancelled for 07/31/17. PV Lab notified. Call to [place] at [facility] to arrange dialysis to accommodate for surgery on 08/11/17. Patient notified to be at [facility] at [place] on 08/11/17 for surgery. NPO past MN night prior and follow the detailed instructions she  Receives from the [facility] Pre- Admission Testing Department about this surgery. Instructed to stay on aspirin and Plavix per Dr. Donnetta Hutching.

## 2017-08-09 ENCOUNTER — Other Ambulatory Visit: Payer: Self-pay | Admitting: *Deleted

## 2017-08-09 DIAGNOSIS — E876 Hypokalemia: Secondary | ICD-10-CM | POA: Diagnosis not present

## 2017-08-09 DIAGNOSIS — D509 Iron deficiency anemia, unspecified: Secondary | ICD-10-CM | POA: Diagnosis not present

## 2017-08-09 DIAGNOSIS — D631 Anemia in chronic kidney disease: Secondary | ICD-10-CM | POA: Diagnosis not present

## 2017-08-09 DIAGNOSIS — N2581 Secondary hyperparathyroidism of renal origin: Secondary | ICD-10-CM | POA: Diagnosis not present

## 2017-08-09 DIAGNOSIS — N186 End stage renal disease: Secondary | ICD-10-CM | POA: Diagnosis not present

## 2017-08-10 ENCOUNTER — Encounter (HOSPITAL_COMMUNITY): Payer: Self-pay | Admitting: *Deleted

## 2017-08-10 ENCOUNTER — Ambulatory Visit (HOSPITAL_COMMUNITY): Admission: RE | Admit: 2017-08-10 | Payer: Medicare Other | Source: Ambulatory Visit | Admitting: Surgery

## 2017-08-10 ENCOUNTER — Other Ambulatory Visit: Payer: Self-pay

## 2017-08-10 ENCOUNTER — Encounter (HOSPITAL_COMMUNITY): Admission: RE | Payer: Self-pay | Source: Ambulatory Visit

## 2017-08-10 DIAGNOSIS — E876 Hypokalemia: Secondary | ICD-10-CM | POA: Diagnosis not present

## 2017-08-10 DIAGNOSIS — N186 End stage renal disease: Secondary | ICD-10-CM | POA: Diagnosis not present

## 2017-08-10 DIAGNOSIS — D631 Anemia in chronic kidney disease: Secondary | ICD-10-CM | POA: Diagnosis not present

## 2017-08-10 DIAGNOSIS — N2581 Secondary hyperparathyroidism of renal origin: Secondary | ICD-10-CM | POA: Diagnosis not present

## 2017-08-10 DIAGNOSIS — D509 Iron deficiency anemia, unspecified: Secondary | ICD-10-CM | POA: Diagnosis not present

## 2017-08-10 SURGERY — ABDOMINAL AORTOGRAM W/LOWER EXTREMITY
Anesthesia: LOCAL

## 2017-08-10 NOTE — Progress Notes (Signed)
Mrs Sensabaugh reports that last A1C was 7.o, drawn at PCP's office  (Dr Velna Hatchet)  this month.  Mrs Bannister reports that fasting CBG run 100- 120, has had one too low to read during the night x 1- nothing different in eating habits that she could think of.  I instructed [patient to take 1/2 of scheduled Levemir dose tonight.  If CBG > 220 in am to take 1/2 of SS Insulin. I instructed patient to check CBG after awaking and every 2 hours until arrival  to the hospital.  I Instructed patient if CBG is less than 70 to drink 1/2 cup of a clear juice. Recheck CBG in 15 minutes then call pre- op desk at [phone] for further instructions. If scheduled to receive Insulin, do not take Insulin

## 2017-08-11 ENCOUNTER — Inpatient Hospital Stay (HOSPITAL_COMMUNITY): Payer: Medicare Other | Admitting: Anesthesiology

## 2017-08-11 ENCOUNTER — Inpatient Hospital Stay (HOSPITAL_COMMUNITY)
Admission: RE | Admit: 2017-08-11 | Discharge: 2017-08-16 | DRG: 252 | Disposition: A | Discharge status: Rehabilitation Facility | Payer: Medicare Other | Source: Ambulatory Visit | Attending: Vascular Surgery | Admitting: Vascular Surgery

## 2017-08-11 ENCOUNTER — Encounter (HOSPITAL_COMMUNITY): Payer: Self-pay | Admitting: *Deleted

## 2017-08-11 ENCOUNTER — Encounter (HOSPITAL_COMMUNITY)
Admission: RE | Disposition: A | Discharge status: Rehabilitation Facility | Payer: Self-pay | Source: Ambulatory Visit | Attending: Vascular Surgery

## 2017-08-11 DIAGNOSIS — E785 Hyperlipidemia, unspecified: Secondary | ICD-10-CM | POA: Diagnosis present

## 2017-08-11 DIAGNOSIS — D631 Anemia in chronic kidney disease: Secondary | ICD-10-CM | POA: Diagnosis not present

## 2017-08-11 DIAGNOSIS — G8918 Other acute postprocedural pain: Secondary | ICD-10-CM | POA: Diagnosis not present

## 2017-08-11 DIAGNOSIS — R402414 Glasgow coma scale score 13-15, 24 hours or more after hospital admission: Secondary | ICD-10-CM | POA: Diagnosis present

## 2017-08-11 DIAGNOSIS — Z7902 Long term (current) use of antithrombotics/antiplatelets: Secondary | ICD-10-CM | POA: Diagnosis not present

## 2017-08-11 DIAGNOSIS — I509 Heart failure, unspecified: Secondary | ICD-10-CM | POA: Diagnosis not present

## 2017-08-11 DIAGNOSIS — G935 Compression of brain: Secondary | ICD-10-CM | POA: Diagnosis not present

## 2017-08-11 DIAGNOSIS — Z992 Dependence on renal dialysis: Secondary | ICD-10-CM

## 2017-08-11 DIAGNOSIS — D62 Acute posthemorrhagic anemia: Secondary | ICD-10-CM | POA: Diagnosis not present

## 2017-08-11 DIAGNOSIS — K449 Diaphragmatic hernia without obstruction or gangrene: Secondary | ICD-10-CM | POA: Diagnosis present

## 2017-08-11 DIAGNOSIS — Z89411 Acquired absence of right great toe: Secondary | ICD-10-CM | POA: Diagnosis not present

## 2017-08-11 DIAGNOSIS — N186 End stage renal disease: Secondary | ICD-10-CM | POA: Diagnosis present

## 2017-08-11 DIAGNOSIS — S065X0D Traumatic subdural hemorrhage without loss of consciousness, subsequent encounter: Secondary | ICD-10-CM | POA: Diagnosis not present

## 2017-08-11 DIAGNOSIS — E119 Type 2 diabetes mellitus without complications: Secondary | ICD-10-CM | POA: Diagnosis not present

## 2017-08-11 DIAGNOSIS — G4730 Sleep apnea, unspecified: Secondary | ICD-10-CM | POA: Diagnosis present

## 2017-08-11 DIAGNOSIS — S98132A Complete traumatic amputation of one left lesser toe, initial encounter: Secondary | ICD-10-CM

## 2017-08-11 DIAGNOSIS — M1990 Unspecified osteoarthritis, unspecified site: Secondary | ICD-10-CM | POA: Diagnosis present

## 2017-08-11 DIAGNOSIS — F411 Generalized anxiety disorder: Secondary | ICD-10-CM | POA: Diagnosis not present

## 2017-08-11 DIAGNOSIS — E1159 Type 2 diabetes mellitus with other circulatory complications: Secondary | ICD-10-CM | POA: Diagnosis not present

## 2017-08-11 DIAGNOSIS — I159 Secondary hypertension, unspecified: Secondary | ICD-10-CM

## 2017-08-11 DIAGNOSIS — S065X0S Traumatic subdural hemorrhage without loss of consciousness, sequela: Secondary | ICD-10-CM | POA: Diagnosis not present

## 2017-08-11 DIAGNOSIS — Z794 Long term (current) use of insulin: Secondary | ICD-10-CM | POA: Diagnosis not present

## 2017-08-11 DIAGNOSIS — D638 Anemia in other chronic diseases classified elsewhere: Secondary | ICD-10-CM | POA: Diagnosis not present

## 2017-08-11 DIAGNOSIS — K921 Melena: Secondary | ICD-10-CM | POA: Diagnosis not present

## 2017-08-11 DIAGNOSIS — I739 Peripheral vascular disease, unspecified: Secondary | ICD-10-CM | POA: Diagnosis not present

## 2017-08-11 DIAGNOSIS — S065X9A Traumatic subdural hemorrhage with loss of consciousness of unspecified duration, initial encounter: Secondary | ICD-10-CM | POA: Diagnosis not present

## 2017-08-11 DIAGNOSIS — J309 Allergic rhinitis, unspecified: Secondary | ICD-10-CM | POA: Diagnosis not present

## 2017-08-11 DIAGNOSIS — S065X1S Traumatic subdural hemorrhage with loss of consciousness of 30 minutes or less, sequela: Secondary | ICD-10-CM | POA: Diagnosis not present

## 2017-08-11 DIAGNOSIS — Z9119 Patient's noncompliance with other medical treatment and regimen: Secondary | ICD-10-CM

## 2017-08-11 DIAGNOSIS — E1152 Type 2 diabetes mellitus with diabetic peripheral angiopathy with gangrene: Principal | ICD-10-CM | POA: Diagnosis present

## 2017-08-11 DIAGNOSIS — R531 Weakness: Secondary | ICD-10-CM | POA: Diagnosis not present

## 2017-08-11 DIAGNOSIS — I5032 Chronic diastolic (congestive) heart failure: Secondary | ICD-10-CM | POA: Diagnosis not present

## 2017-08-11 DIAGNOSIS — R011 Cardiac murmur, unspecified: Secondary | ICD-10-CM | POA: Diagnosis present

## 2017-08-11 DIAGNOSIS — E113521 Type 2 diabetes mellitus with proliferative diabetic retinopathy with traction retinal detachment involving the macula, right eye: Secondary | ICD-10-CM

## 2017-08-11 DIAGNOSIS — E1122 Type 2 diabetes mellitus with diabetic chronic kidney disease: Secondary | ICD-10-CM | POA: Diagnosis present

## 2017-08-11 DIAGNOSIS — E1140 Type 2 diabetes mellitus with diabetic neuropathy, unspecified: Secondary | ICD-10-CM | POA: Diagnosis present

## 2017-08-11 DIAGNOSIS — I132 Hypertensive heart and chronic kidney disease with heart failure and with stage 5 chronic kidney disease, or end stage renal disease: Secondary | ICD-10-CM | POA: Diagnosis present

## 2017-08-11 DIAGNOSIS — F419 Anxiety disorder, unspecified: Secondary | ICD-10-CM | POA: Diagnosis present

## 2017-08-11 DIAGNOSIS — I96 Gangrene, not elsewhere classified: Secondary | ICD-10-CM | POA: Diagnosis not present

## 2017-08-11 DIAGNOSIS — I10 Essential (primary) hypertension: Secondary | ICD-10-CM | POA: Diagnosis not present

## 2017-08-11 DIAGNOSIS — R0989 Other specified symptoms and signs involving the circulatory and respiratory systems: Secondary | ICD-10-CM | POA: Diagnosis not present

## 2017-08-11 DIAGNOSIS — K219 Gastro-esophageal reflux disease without esophagitis: Secondary | ICD-10-CM | POA: Diagnosis present

## 2017-08-11 DIAGNOSIS — I998 Other disorder of circulatory system: Secondary | ICD-10-CM | POA: Diagnosis not present

## 2017-08-11 DIAGNOSIS — Z87891 Personal history of nicotine dependence: Secondary | ICD-10-CM

## 2017-08-11 DIAGNOSIS — J302 Other seasonal allergic rhinitis: Secondary | ICD-10-CM | POA: Diagnosis present

## 2017-08-11 DIAGNOSIS — Z9189 Other specified personal risk factors, not elsewhere classified: Secondary | ICD-10-CM | POA: Diagnosis not present

## 2017-08-11 DIAGNOSIS — W1830XA Fall on same level, unspecified, initial encounter: Secondary | ICD-10-CM | POA: Diagnosis not present

## 2017-08-11 DIAGNOSIS — N2581 Secondary hyperparathyroidism of renal origin: Secondary | ICD-10-CM | POA: Diagnosis present

## 2017-08-11 DIAGNOSIS — I5030 Unspecified diastolic (congestive) heart failure: Secondary | ICD-10-CM | POA: Diagnosis present

## 2017-08-11 DIAGNOSIS — J449 Chronic obstructive pulmonary disease, unspecified: Secondary | ICD-10-CM | POA: Diagnosis not present

## 2017-08-11 DIAGNOSIS — J45909 Unspecified asthma, uncomplicated: Secondary | ICD-10-CM | POA: Diagnosis not present

## 2017-08-11 DIAGNOSIS — Z888 Allergy status to other drugs, medicaments and biological substances status: Secondary | ICD-10-CM

## 2017-08-11 DIAGNOSIS — R5381 Other malaise: Secondary | ICD-10-CM | POA: Diagnosis not present

## 2017-08-11 DIAGNOSIS — W19XXXA Unspecified fall, initial encounter: Secondary | ICD-10-CM | POA: Diagnosis not present

## 2017-08-11 DIAGNOSIS — I798 Other disorders of arteries, arterioles and capillaries in diseases classified elsewhere: Secondary | ICD-10-CM | POA: Diagnosis not present

## 2017-08-11 DIAGNOSIS — E1142 Type 2 diabetes mellitus with diabetic polyneuropathy: Secondary | ICD-10-CM | POA: Diagnosis not present

## 2017-08-11 DIAGNOSIS — S0181XA Laceration without foreign body of other part of head, initial encounter: Secondary | ICD-10-CM | POA: Diagnosis not present

## 2017-08-11 DIAGNOSIS — Z89422 Acquired absence of other left toe(s): Secondary | ICD-10-CM | POA: Diagnosis not present

## 2017-08-11 DIAGNOSIS — S98111A Complete traumatic amputation of right great toe, initial encounter: Secondary | ICD-10-CM

## 2017-08-11 DIAGNOSIS — S7011XA Contusion of right thigh, initial encounter: Secondary | ICD-10-CM | POA: Diagnosis present

## 2017-08-11 DIAGNOSIS — R7309 Other abnormal glucose: Secondary | ICD-10-CM | POA: Diagnosis not present

## 2017-08-11 DIAGNOSIS — B999 Unspecified infectious disease: Secondary | ICD-10-CM | POA: Diagnosis not present

## 2017-08-11 DIAGNOSIS — Z298 Encounter for other specified prophylactic measures: Secondary | ICD-10-CM | POA: Diagnosis not present

## 2017-08-11 DIAGNOSIS — Z79899 Other long term (current) drug therapy: Secondary | ICD-10-CM

## 2017-08-11 DIAGNOSIS — Z7982 Long term (current) use of aspirin: Secondary | ICD-10-CM

## 2017-08-11 DIAGNOSIS — M810 Age-related osteoporosis without current pathological fracture: Secondary | ICD-10-CM | POA: Diagnosis present

## 2017-08-11 DIAGNOSIS — D72829 Elevated white blood cell count, unspecified: Secondary | ICD-10-CM | POA: Diagnosis not present

## 2017-08-11 DIAGNOSIS — E8889 Other specified metabolic disorders: Secondary | ICD-10-CM | POA: Diagnosis present

## 2017-08-11 DIAGNOSIS — N189 Chronic kidney disease, unspecified: Secondary | ICD-10-CM

## 2017-08-11 DIAGNOSIS — E1151 Type 2 diabetes mellitus with diabetic peripheral angiopathy without gangrene: Secondary | ICD-10-CM | POA: Diagnosis present

## 2017-08-11 HISTORY — PX: AMPUTATION: SHX166

## 2017-08-11 HISTORY — DX: Polyneuropathy, unspecified: G62.9

## 2017-08-11 HISTORY — DX: Peripheral vascular disease, unspecified: I73.9

## 2017-08-11 HISTORY — PX: FEMORAL-POPLITEAL BYPASS GRAFT: SHX937

## 2017-08-11 LAB — CBC
HCT: 42.3 % (ref 36.0–46.0)
Hemoglobin: 13.6 g/dL (ref 12.0–15.0)
MCH: 31.4 pg (ref 26.0–34.0)
MCHC: 32.2 g/dL (ref 30.0–36.0)
MCV: 97.7 fL (ref 78.0–100.0)
Platelets: 341 10*3/uL (ref 150–400)
RBC: 4.33 MIL/uL (ref 3.87–5.11)
RDW: 14.3 % (ref 11.5–15.5)
WBC: 9.4 10*3/uL (ref 4.0–10.5)

## 2017-08-11 LAB — GLUCOSE, CAPILLARY
Glucose-Capillary: 86 mg/dL (ref 65–99)
Glucose-Capillary: 99 mg/dL (ref 65–99)
Glucose-Capillary: 83 mg/dL (ref 65–99)
Glucose-Capillary: 76 mg/dL (ref 65–99)
Glucose-Capillary: 152 mg/dL — ABNORMAL HIGH (ref 65–99)

## 2017-08-11 LAB — COMPREHENSIVE METABOLIC PANEL
ALT: 12 U/L — ABNORMAL LOW (ref 14–54)
AST: 18 U/L (ref 15–41)
Albumin: 2.7 g/dL — ABNORMAL LOW (ref 3.5–5.0)
Alkaline Phosphatase: 144 U/L — ABNORMAL HIGH (ref 38–126)
Anion gap: 11 (ref 5–15)
BUN: 20 mg/dL (ref 6–20)
CO2: 23 mmol/L (ref 22–32)
Calcium: 7.7 mg/dL — ABNORMAL LOW (ref 8.9–10.3)
Chloride: 104 mmol/L (ref 101–111)
Creatinine, Ser: 3.48 mg/dL — ABNORMAL HIGH (ref 0.44–1.00)
GFR calc Af Amer: 15 mL/min — ABNORMAL LOW (ref >60)
GFR calc non Af Amer: 13 mL/min — ABNORMAL LOW (ref >60)
Glucose, Bld: 85 mg/dL (ref 65–99)
Potassium: 3.6 mmol/L (ref 3.5–5.1)
Sodium: 138 mmol/L (ref 135–145)
Total Bilirubin: 0.5 mg/dL (ref 0.3–1.2)
Total Protein: 6.2 g/dL — ABNORMAL LOW (ref 6.5–8.1)

## 2017-08-11 LAB — PROTIME-INR
INR: 1.05
Prothrombin Time: 13.6 seconds (ref 11.4–15.2)

## 2017-08-11 LAB — TYPE AND SCREEN
ABO/RH(D): B POS
Antibody Screen: NEGATIVE

## 2017-08-11 LAB — APTT: aPTT: 34 seconds (ref 24–36)

## 2017-08-11 LAB — HEMOGLOBIN A1C
Hgb A1c MFr Bld: 7 % — ABNORMAL HIGH (ref 4.8–5.6)
Mean Plasma Glucose: 154.2 mg/dL

## 2017-08-11 SURGERY — BYPASS GRAFT FEMORAL-POPLITEAL ARTERY
Anesthesia: General | Site: Leg Upper | Laterality: Right

## 2017-08-11 MED ORDER — CALCIUM ACETATE (PHOS BINDER) 667 MG PO CAPS
1334.0000 mg | ORAL_CAPSULE | Freq: Three times a day (TID) | ORAL | Status: DC
  Administered 2017-08-12 – 2017-08-13 (×4): 1334 mg via ORAL
  Administered 2017-08-14: 667 mg via ORAL
  Administered 2017-08-15: 1334 mg via ORAL
  Filled 2017-08-11 (×9): qty 2

## 2017-08-11 MED ORDER — MOMETASONE FURO-FORMOTEROL FUM 200-5 MCG/ACT IN AERO
2.0000 | INHALATION_SPRAY | Freq: Two times a day (BID) | RESPIRATORY_TRACT | Status: DC
  Administered 2017-08-12 – 2017-08-15 (×7): 2 via RESPIRATORY_TRACT
  Filled 2017-08-11: qty 8.8

## 2017-08-11 MED ORDER — CEFUROXIME SODIUM 1.5 G IV SOLR
1.5000 g | INTRAVENOUS | Status: AC
  Administered 2017-08-11: 1.5 g via INTRAVENOUS
  Filled 2017-08-11: qty 1.5

## 2017-08-11 MED ORDER — SUGAMMADEX SODIUM 200 MG/2ML IV SOLN
INTRAVENOUS | Status: AC
  Filled 2017-08-11: qty 2

## 2017-08-11 MED ORDER — CINACALCET HCL 30 MG PO TABS
30.0000 mg | ORAL_TABLET | Freq: Every day | ORAL | Status: DC
  Administered 2017-08-13: 30 mg via ORAL
  Filled 2017-08-11: qty 1

## 2017-08-11 MED ORDER — DOCUSATE SODIUM 100 MG PO CAPS
100.0000 mg | ORAL_CAPSULE | Freq: Every day | ORAL | Status: DC
  Administered 2017-08-12 – 2017-08-16 (×5): 100 mg via ORAL
  Filled 2017-08-11 (×5): qty 1

## 2017-08-11 MED ORDER — NEOSTIGMINE METHYLSULFATE 10 MG/10ML IV SOLN
INTRAVENOUS | Status: DC | PRN
  Administered 2017-08-11: 3 mg via INTRAVENOUS

## 2017-08-11 MED ORDER — DIAZEPAM 5 MG PO TABS
5.0000 mg | ORAL_TABLET | Freq: Two times a day (BID) | ORAL | Status: DC | PRN
  Administered 2017-08-14: 5 mg via ORAL
  Filled 2017-08-11: qty 1

## 2017-08-11 MED ORDER — PROPOFOL 10 MG/ML IV BOLUS
INTRAVENOUS | Status: AC
  Filled 2017-08-11: qty 40

## 2017-08-11 MED ORDER — PROPOFOL 10 MG/ML IV BOLUS
INTRAVENOUS | Status: DC | PRN
  Administered 2017-08-11: 120 mg via INTRAVENOUS

## 2017-08-11 MED ORDER — GLYCOPYRROLATE 0.2 MG/ML IJ SOLN
INTRAMUSCULAR | Status: DC | PRN
  Administered 2017-08-11: .5 mg via INTRAVENOUS

## 2017-08-11 MED ORDER — ASPIRIN 81 MG PO CHEW
81.0000 mg | CHEWABLE_TABLET | Freq: Every evening | ORAL | Status: DC
  Administered 2017-08-12 – 2017-08-15 (×4): 81 mg via ORAL
  Filled 2017-08-11 (×4): qty 1

## 2017-08-11 MED ORDER — LACTATED RINGERS IV SOLN: INTRAVENOUS | Status: DC | PRN

## 2017-08-11 MED ORDER — FLUTICASONE PROPIONATE 50 MCG/ACT NA SUSP
1.0000 | Freq: Every day | NASAL | Status: DC | PRN
  Filled 2017-08-11: qty 16

## 2017-08-11 MED ORDER — OXYCODONE HCL 5 MG/5ML PO SOLN: 5.0000 mg | Freq: Once | ORAL | Status: AC | PRN

## 2017-08-11 MED ORDER — PROTAMINE SULFATE 10 MG/ML IV SOLN
INTRAVENOUS | Status: AC
  Filled 2017-08-11: qty 10

## 2017-08-11 MED ORDER — SODIUM CHLORIDE 0.9 % IV SOLN
INTRAVENOUS | Status: DC | PRN
  Administered 2017-08-11: 500 mL

## 2017-08-11 MED ORDER — FENTANYL CITRATE (PF) 100 MCG/2ML IJ SOLN
INTRAMUSCULAR | Status: DC | PRN
  Administered 2017-08-11: 150 ug via INTRAVENOUS
  Administered 2017-08-11 (×2): 50 ug via INTRAVENOUS

## 2017-08-11 MED ORDER — GABAPENTIN 100 MG PO CAPS
100.0000 mg | ORAL_CAPSULE | Freq: Every day | ORAL | Status: DC
  Administered 2017-08-11 – 2017-08-15 (×5): 100 mg via ORAL
  Filled 2017-08-11 (×5): qty 1

## 2017-08-11 MED ORDER — HYDRALAZINE HCL 25 MG PO TABS
25.0000 mg | ORAL_TABLET | Freq: Three times a day (TID) | ORAL | Status: DC
  Administered 2017-08-11 – 2017-08-12 (×2): 25 mg via ORAL
  Filled 2017-08-11 (×2): qty 1

## 2017-08-11 MED ORDER — ONDANSETRON HCL 4 MG/2ML IJ SOLN
4.0000 mg | Freq: Four times a day (QID) | INTRAMUSCULAR | Status: DC | PRN
  Administered 2017-08-12 – 2017-08-14 (×2): 4 mg via INTRAVENOUS
  Filled 2017-08-11 (×2): qty 2

## 2017-08-11 MED ORDER — SODIUM CHLORIDE 0.9 % IV SOLN: 500.0000 mL | Freq: Once | INTRAVENOUS | Status: DC | PRN

## 2017-08-11 MED ORDER — GUAIFENESIN-DM 100-10 MG/5ML PO SYRP: 15.0000 mL | ORAL_SOLUTION | ORAL | Status: DC | PRN

## 2017-08-11 MED ORDER — LIDOCAINE HCL (CARDIAC) 20 MG/ML IV SOLN
INTRAVENOUS | Status: DC | PRN
  Administered 2017-08-11: 30 mg via INTRAVENOUS

## 2017-08-11 MED ORDER — SUCCINYLCHOLINE CHLORIDE 20 MG/ML IJ SOLN
INTRAMUSCULAR | Status: DC | PRN
  Administered 2017-08-11: 100 mg via INTRAVENOUS

## 2017-08-11 MED ORDER — OXYCODONE HCL 5 MG PO TABS
5.0000 mg | ORAL_TABLET | Freq: Once | ORAL | Status: AC | PRN
  Administered 2017-08-11: 5 mg via ORAL

## 2017-08-11 MED ORDER — PHENYLEPHRINE HCL 10 MG/ML IJ SOLN
INTRAVENOUS | Status: DC | PRN
  Administered 2017-08-11: 25 ug/min via INTRAVENOUS

## 2017-08-11 MED ORDER — NEOSTIGMINE METHYLSULFATE 5 MG/5ML IV SOSY
PREFILLED_SYRINGE | INTRAVENOUS | Status: AC
  Filled 2017-08-11: qty 5

## 2017-08-11 MED ORDER — HYDROMORPHONE HCL 1 MG/ML IJ SOLN
0.2500 mg | INTRAMUSCULAR | Status: DC | PRN
  Administered 2017-08-11 (×2): 0.5 mg via INTRAVENOUS

## 2017-08-11 MED ORDER — FENTANYL CITRATE (PF) 250 MCG/5ML IJ SOLN
INTRAMUSCULAR | Status: AC
  Filled 2017-08-11: qty 5

## 2017-08-11 MED ORDER — CARVEDILOL 12.5 MG PO TABS
12.5000 mg | ORAL_TABLET | Freq: Two times a day (BID) | ORAL | Status: DC
  Administered 2017-08-11 – 2017-08-12 (×3): 12.5 mg via ORAL
  Filled 2017-08-11 (×3): qty 1

## 2017-08-11 MED ORDER — CISATRACURIUM 2MG/ML (10ML) SYRINGE FOR MED FUSION PUMP - OPTIME
INTRAVENOUS | Status: DC | PRN
Start: 2017-08-11 — End: 2017-08-11
  Administered 2017-08-11: 2 mg via INTRAVENOUS
  Administered 2017-08-11: 4 mg via INTRAVENOUS
  Administered 2017-08-11: 2 mg via INTRAVENOUS

## 2017-08-11 MED ORDER — CARVEDILOL 12.5 MG PO TABS
12.5000 mg | ORAL_TABLET | ORAL | Status: AC
  Administered 2017-08-11: 12.5 mg via ORAL

## 2017-08-11 MED ORDER — HYDROMORPHONE HCL 1 MG/ML IJ SOLN
INTRAMUSCULAR | Status: AC
  Administered 2017-08-11: 0.5 mg via INTRAVENOUS
  Filled 2017-08-11: qty 1

## 2017-08-11 MED ORDER — DEXAMETHASONE SODIUM PHOSPHATE 10 MG/ML IJ SOLN
INTRAMUSCULAR | Status: AC
  Filled 2017-08-11: qty 1

## 2017-08-11 MED ORDER — HYDRALAZINE HCL 20 MG/ML IJ SOLN: 5.0000 mg | INTRAMUSCULAR | Status: DC | PRN

## 2017-08-11 MED ORDER — SENNOSIDES-DOCUSATE SODIUM 8.6-50 MG PO TABS: 1.0000 | ORAL_TABLET | Freq: Every evening | ORAL | Status: DC | PRN

## 2017-08-11 MED ORDER — POTASSIUM CHLORIDE CRYS ER 20 MEQ PO TBCR: 20.0000 meq | EXTENDED_RELEASE_TABLET | Freq: Every day | ORAL | Status: DC | PRN

## 2017-08-11 MED ORDER — INSULIN DETEMIR 100 UNIT/ML ~~LOC~~ SOLN
10.0000 [IU] | Freq: Every day | SUBCUTANEOUS | Status: DC
  Administered 2017-08-11 – 2017-08-15 (×5): 10 [IU] via SUBCUTANEOUS
  Filled 2017-08-11 (×6): qty 0.1

## 2017-08-11 MED ORDER — OXYCODONE HCL 5 MG PO TABS
ORAL_TABLET | ORAL | Status: AC
  Administered 2017-08-11: 5 mg via ORAL
  Filled 2017-08-11: qty 1

## 2017-08-11 MED ORDER — SODIUM CHLORIDE 0.9 % IV SOLN: INTRAVENOUS | Status: DC

## 2017-08-11 MED ORDER — ACETAMINOPHEN 650 MG RE SUPP
325.0000 mg | RECTAL | Status: DC | PRN
Start: 2017-08-11 — End: 2017-08-16

## 2017-08-11 MED ORDER — HYDROCODONE-ACETAMINOPHEN 5-325 MG PO TABS
1.0000 | ORAL_TABLET | ORAL | Status: DC | PRN
  Administered 2017-08-11 – 2017-08-16 (×9): 2 via ORAL
  Filled 2017-08-11 (×7): qty 2

## 2017-08-11 MED ORDER — MORPHINE SULFATE (PF) 2 MG/ML IV SOLN
1.0000 mg | INTRAVENOUS | Status: DC | PRN
  Administered 2017-08-13: 2 mg via INTRAVENOUS
  Filled 2017-08-11: qty 1

## 2017-08-11 MED ORDER — LABETALOL HCL 5 MG/ML IV SOLN: 10.0000 mg | INTRAVENOUS | Status: DC | PRN

## 2017-08-11 MED ORDER — SUCCINYLCHOLINE CHLORIDE 200 MG/10ML IV SOSY
PREFILLED_SYRINGE | INTRAVENOUS | Status: AC
  Filled 2017-08-11: qty 10

## 2017-08-11 MED ORDER — BISACODYL 5 MG PO TBEC: 5.0000 mg | DELAYED_RELEASE_TABLET | Freq: Every day | ORAL | Status: DC | PRN

## 2017-08-11 MED ORDER — AMLODIPINE BESYLATE 10 MG PO TABS
10.0000 mg | ORAL_TABLET | Freq: Every day | ORAL | Status: DC
  Administered 2017-08-11: 10 mg via ORAL
  Filled 2017-08-11: qty 1

## 2017-08-11 MED ORDER — PHENOL 1.4 % MT LIQD: 1.0000 | OROMUCOSAL | Status: DC | PRN

## 2017-08-11 MED ORDER — ACETAMINOPHEN 325 MG PO TABS: 325.0000 mg | ORAL_TABLET | ORAL | Status: DC | PRN

## 2017-08-11 MED ORDER — PHENYLEPHRINE HCL 10 MG/ML IJ SOLN
INTRAMUSCULAR | Status: DC | PRN
  Administered 2017-08-11 (×2): 100 ug via INTRAVENOUS

## 2017-08-11 MED ORDER — HEPARIN SODIUM (PORCINE) 1000 UNIT/ML IJ SOLN
INTRAMUSCULAR | Status: DC | PRN
  Administered 2017-08-11: 7000 [IU] via INTRAVENOUS

## 2017-08-11 MED ORDER — ALBUMIN HUMAN 5 % IV SOLN
INTRAVENOUS | Status: DC | PRN
  Administered 2017-08-11: 15:00:00 via INTRAVENOUS

## 2017-08-11 MED ORDER — LIDOCAINE 2% (20 MG/ML) 5 ML SYRINGE
INTRAMUSCULAR | Status: AC
  Filled 2017-08-11: qty 5

## 2017-08-11 MED ORDER — METOPROLOL TARTRATE 5 MG/5ML IV SOLN: 2.0000 mg | INTRAVENOUS | Status: DC | PRN

## 2017-08-11 MED ORDER — ISOSORBIDE DINITRATE 20 MG PO TABS
20.0000 mg | ORAL_TABLET | Freq: Two times a day (BID) | ORAL | Status: DC
  Administered 2017-08-12 – 2017-08-16 (×8): 20 mg via ORAL
  Filled 2017-08-11 (×10): qty 1

## 2017-08-11 MED ORDER — ONDANSETRON HCL 4 MG/2ML IJ SOLN
INTRAMUSCULAR | Status: AC
  Filled 2017-08-11: qty 2

## 2017-08-11 MED ORDER — PANTOPRAZOLE SODIUM 40 MG PO TBEC
40.0000 mg | DELAYED_RELEASE_TABLET | Freq: Every day | ORAL | Status: DC
  Administered 2017-08-12 – 2017-08-16 (×5): 40 mg via ORAL
  Filled 2017-08-11 (×5): qty 1

## 2017-08-11 MED ORDER — HEPARIN SODIUM (PORCINE) 5000 UNIT/ML IJ SOLN
5000.0000 [IU] | Freq: Three times a day (TID) | INTRAMUSCULAR | Status: DC
  Administered 2017-08-11 – 2017-08-16 (×14): 5000 [IU] via SUBCUTANEOUS
  Filled 2017-08-11 (×14): qty 1

## 2017-08-11 MED ORDER — MIDAZOLAM HCL 2 MG/2ML IJ SOLN
INTRAMUSCULAR | Status: AC
Start: 2017-08-11 — End: 2017-08-11
  Filled 2017-08-11: qty 2

## 2017-08-11 MED ORDER — CHLORHEXIDINE GLUCONATE 4 % EX LIQD: 60.0000 mL | Freq: Once | CUTANEOUS | Status: DC

## 2017-08-11 MED ORDER — CARVEDILOL 12.5 MG PO TABS
ORAL_TABLET | ORAL | Status: AC
  Filled 2017-08-11: qty 1

## 2017-08-11 MED ORDER — FERRIC CITRATE 1 GM 210 MG(FE) PO TABS
420.0000 mg | ORAL_TABLET | Freq: Three times a day (TID) | ORAL | Status: DC
  Administered 2017-08-12 – 2017-08-15 (×5): 420 mg via ORAL
  Filled 2017-08-11 (×16): qty 2

## 2017-08-11 MED ORDER — HEMOSTATIC AGENTS (NO CHARGE) OPTIME
TOPICAL | Status: DC | PRN
  Administered 2017-08-11: 1 via TOPICAL

## 2017-08-11 MED ORDER — ONDANSETRON HCL 4 MG/2ML IJ SOLN
INTRAMUSCULAR | Status: DC | PRN
  Administered 2017-08-11: 4 mg via INTRAVENOUS

## 2017-08-11 MED ORDER — CISATRACURIUM BESYLATE 20 MG/10ML IV SOLN
INTRAVENOUS | Status: AC
  Filled 2017-08-11: qty 20

## 2017-08-11 MED ORDER — LORATADINE 10 MG PO TABS: 10.0000 mg | ORAL_TABLET | Freq: Every day | ORAL | Status: DC | PRN

## 2017-08-11 MED ORDER — HEPARIN SODIUM (PORCINE) 1000 UNIT/ML IJ SOLN
INTRAMUSCULAR | Status: AC
  Filled 2017-08-11: qty 1

## 2017-08-11 MED ORDER — DEXTROSE 5 % IV SOLN
1.5000 g | Freq: Two times a day (BID) | INTRAVENOUS | Status: AC
  Administered 2017-08-12 (×2): 1.5 g via INTRAVENOUS
  Filled 2017-08-11 (×2): qty 1.5

## 2017-08-11 MED ORDER — SUCROFERRIC OXYHYDROXIDE 500 MG PO CHEW
500.0000 mg | CHEWABLE_TABLET | ORAL | Status: DC
  Administered 2017-08-14 – 2017-08-15 (×3): 500 mg via ORAL
  Filled 2017-08-11 (×13): qty 1

## 2017-08-11 MED ORDER — SODIUM CHLORIDE 0.9 % IV SOLN
INTRAVENOUS | Status: DC
  Administered 2017-08-11 (×3): via INTRAVENOUS

## 2017-08-11 MED ORDER — ATORVASTATIN CALCIUM 20 MG PO TABS
20.0000 mg | ORAL_TABLET | Freq: Every day | ORAL | Status: DC
  Administered 2017-08-11 – 2017-08-15 (×5): 20 mg via ORAL
  Filled 2017-08-11 (×5): qty 1

## 2017-08-11 MED ORDER — CLOPIDOGREL BISULFATE 75 MG PO TABS
75.0000 mg | ORAL_TABLET | Freq: Every day | ORAL | Status: DC
  Administered 2017-08-12 – 2017-08-16 (×5): 75 mg via ORAL
  Filled 2017-08-11 (×5): qty 1

## 2017-08-11 MED ORDER — SUCROFERRIC OXYHYDROXIDE 500 MG PO CHEW
1000.0000 mg | CHEWABLE_TABLET | Freq: Three times a day (TID) | ORAL | Status: DC
  Administered 2017-08-12 – 2017-08-15 (×5): 1000 mg via ORAL
  Filled 2017-08-11 (×15): qty 2

## 2017-08-11 MED ORDER — ALBUTEROL SULFATE (2.5 MG/3ML) 0.083% IN NEBU: 2.5000 mg | INHALATION_SOLUTION | Freq: Four times a day (QID) | RESPIRATORY_TRACT | Status: DC | PRN

## 2017-08-11 MED ORDER — PROTAMINE SULFATE 10 MG/ML IV SOLN
INTRAVENOUS | Status: DC | PRN
  Administered 2017-08-11 (×10): 10 mg via INTRAVENOUS

## 2017-08-11 MED ORDER — ALUM & MAG HYDROXIDE-SIMETH 200-200-20 MG/5ML PO SUSP: 15.0000 mL | ORAL | Status: DC | PRN

## 2017-08-11 MED ORDER — HYDROCODONE-ACETAMINOPHEN 5-325 MG PO TABS
ORAL_TABLET | ORAL | Status: AC
  Filled 2017-08-11: qty 2

## 2017-08-11 MED ORDER — PHENYLEPHRINE 40 MCG/ML (10ML) SYRINGE FOR IV PUSH (FOR BLOOD PRESSURE SUPPORT)
PREFILLED_SYRINGE | INTRAVENOUS | Status: AC
  Filled 2017-08-11: qty 10

## 2017-08-11 MED ORDER — 0.9 % SODIUM CHLORIDE (POUR BTL) OPTIME
TOPICAL | Status: DC | PRN
  Administered 2017-08-11: 1000 mL

## 2017-08-11 MED ORDER — MAGNESIUM SULFATE 2 GM/50ML IV SOLN: 2.0000 g | Freq: Every day | INTRAVENOUS | Status: DC | PRN

## 2017-08-11 MED ORDER — RENA-VITE PO TABS
1.0000 | ORAL_TABLET | Freq: Every day | ORAL | Status: DC
  Administered 2017-08-11 – 2017-08-15 (×5): 1 via ORAL
  Filled 2017-08-11 (×5): qty 1

## 2017-08-11 SURGICAL SUPPLY — 63 items
BANDAGE ACE 6X5 VEL STRL LF (GAUZE/BANDAGES/DRESSINGS) ×3 IMPLANT
BANDAGE ESMARK 6X9 LF (GAUZE/BANDAGES/DRESSINGS) IMPLANT
BNDG ESMARK 6X9 LF (GAUZE/BANDAGES/DRESSINGS)
BNDG GAUZE ELAST 4 BULKY (GAUZE/BANDAGES/DRESSINGS) ×3 IMPLANT
CANISTER SUCT 3000ML PPV (MISCELLANEOUS) ×3 IMPLANT
CANNULA VESSEL 3MM 2 BLNT TIP (CANNULA) ×6 IMPLANT
CLIP LIGATING EXTRA MED SLVR (CLIP) ×3 IMPLANT
CLIP LIGATING EXTRA SM BLUE (MISCELLANEOUS) ×3 IMPLANT
COVER SURGICAL LIGHT HANDLE (MISCELLANEOUS) ×6 IMPLANT
CUFF TOURNIQUET SINGLE 34IN LL (TOURNIQUET CUFF) IMPLANT
CUFF TOURNIQUET SINGLE 44IN (TOURNIQUET CUFF) IMPLANT
DERMABOND ADHESIVE PROPEN (GAUZE/BANDAGES/DRESSINGS) ×1
DERMABOND ADVANCED (GAUZE/BANDAGES/DRESSINGS) ×1
DERMABOND ADVANCED .7 DNX12 (GAUZE/BANDAGES/DRESSINGS) ×2 IMPLANT
DERMABOND ADVANCED .7 DNX6 (GAUZE/BANDAGES/DRESSINGS) ×2 IMPLANT
DRAIN SNY 10X20 3/4 PERF (WOUND CARE) IMPLANT
DRAPE EXTREMITY T 121X128X90 (DRAPE) ×3 IMPLANT
DRAPE HALF SHEET 40X57 (DRAPES) IMPLANT
DRAPE X-RAY CASS 24X20 (DRAPES) IMPLANT
DRSG EMULSION OIL 3X3 NADH (GAUZE/BANDAGES/DRESSINGS) ×3 IMPLANT
ELECT REM PT RETURN 9FT ADLT (ELECTROSURGICAL) ×3
ELECTRODE REM PT RTRN 9FT ADLT (ELECTROSURGICAL) ×2 IMPLANT
EVACUATOR SILICONE 100CC (DRAIN) IMPLANT
GAUZE SPONGE 4X4 12PLY STRL (GAUZE/BANDAGES/DRESSINGS) ×3 IMPLANT
GLOVE BIOGEL PI IND STRL 7.5 (GLOVE) ×4 IMPLANT
GLOVE BIOGEL PI INDICATOR 7.5 (GLOVE) ×2
GLOVE ECLIPSE 6.5 STRL STRAW (GLOVE) ×3 IMPLANT
GLOVE ECLIPSE 7.0 STRL STRAW (GLOVE) ×6 IMPLANT
GLOVE SS BIOGEL STRL SZ 7.5 (GLOVE) ×2 IMPLANT
GLOVE SUPERSENSE BIOGEL SZ 7.5 (GLOVE) ×1
GOWN STRL REUS W/ TWL LRG LVL3 (GOWN DISPOSABLE) ×8 IMPLANT
GOWN STRL REUS W/TWL LRG LVL3 (GOWN DISPOSABLE) ×4
GRAFT PROPATEN W/RING 6X80X60 (Vascular Products) ×3 IMPLANT
HEMOSTAT SNOW SURGICEL 2X4 (HEMOSTASIS) ×3 IMPLANT
INSERT FOGARTY SM (MISCELLANEOUS) IMPLANT
KIT BASIN OR (CUSTOM PROCEDURE TRAY) ×3 IMPLANT
KIT ROOM TURNOVER OR (KITS) ×3 IMPLANT
NEEDLE 22X1 1/2 (OR ONLY) (NEEDLE) IMPLANT
NS IRRIG 1000ML POUR BTL (IV SOLUTION) ×6 IMPLANT
PACK GENERAL/GYN (CUSTOM PROCEDURE TRAY) ×3 IMPLANT
PACK PERIPHERAL VASCULAR (CUSTOM PROCEDURE TRAY) ×3 IMPLANT
PAD ARMBOARD 7.5X6 YLW CONV (MISCELLANEOUS) ×6 IMPLANT
PADDING CAST COTTON 6X4 STRL (CAST SUPPLIES) IMPLANT
SET COLLECT BLD 21X3/4 12 (NEEDLE) IMPLANT
SPONGE LAP 18X18 X RAY DECT (DISPOSABLE) ×6 IMPLANT
STOPCOCK 4 WAY LG BORE MALE ST (IV SETS) IMPLANT
SUT ETHILON 3 0 PS 1 (SUTURE) ×3 IMPLANT
SUT PROLENE 5 0 C 1 24 (SUTURE) ×3 IMPLANT
SUT PROLENE 5 0 CC 1 (SUTURE) ×6 IMPLANT
SUT PROLENE 6 0 CC (SUTURE) ×6 IMPLANT
SUT SILK 2 0 SH (SUTURE) ×3 IMPLANT
SUT SILK 4 0 (SUTURE) ×1
SUT SILK 4-0 18XBRD TIE 12 (SUTURE) ×2 IMPLANT
SUT VIC AB 2-0 CTX 36 (SUTURE) ×6 IMPLANT
SUT VIC AB 3-0 SH 27 (SUTURE) ×4
SUT VIC AB 3-0 SH 27X BRD (SUTURE) ×8 IMPLANT
SUT VIC AB 4-0 PS2 18 (SUTURE) ×3 IMPLANT
SYR CONTROL 10ML LL (SYRINGE) IMPLANT
TOWEL GREEN STERILE (TOWEL DISPOSABLE) ×6 IMPLANT
TRAY FOLEY W/METER SILVER 16FR (SET/KITS/TRAYS/PACK) ×3 IMPLANT
TUBING EXTENTION W/L.L. (IV SETS) IMPLANT
UNDERPAD 30X30 (UNDERPADS AND DIAPERS) ×3 IMPLANT
WATER STERILE IRR 1000ML POUR (IV SOLUTION) ×3 IMPLANT

## 2017-08-11 NOTE — Transfer of Care (Signed)
Immediate Anesthesia Transfer of Care Note  Patient: Destiny Day  Procedure(s) Performed: RIGHT FEMORAL TO BELOW KNEE POPLITEAL ARTERKY  BYPASS GRAFT USING 6MM RINGED PROPATEN GRAFT (Right Leg Upper) RIGHT GREAT TOE AMPUTATION DIGIT (Right )  Patient Location: PACU  Anesthesia Type:General  Level of Consciousness: awake and alert   Airway & Oxygen Therapy: Patient Spontanous Breathing and Patient connected to nasal cannula oxygen  Post-op Assessment: Report given to RN and Post -op Vital signs reviewed and stable  Post vital signs: Reviewed and stable  Last Vitals:  Vitals:   08/11/17 0810  BP: (!) 195/71  Pulse: 81  Resp: 20  Temp: 36.6 C  SpO2: 100%    Last Pain:  Vitals:   08/11/17 0843  PainSc: 5          Complications: No apparent anesthesia complications

## 2017-08-11 NOTE — Op Note (Signed)
OPERATIVE REPORT  DATE OF SURGERY: 08/11/2017  PATIENT: Destiny Day, 67 y.o. female MRN: [id number]  DOB: Mar 22, 1951  PRE-OPERATIVE DIAGNOSIS: Right foot ischemia with gangrene right great toe  POST-OPERATIVE DIAGNOSIS:  Same  PROCEDURE: #1 right femoral to below-knee popliteal bypass with 6 mm ring propatent Gore-Tex graft, #2 right great toe amputation  SURGEON:  Curt Jews, M.D.  PHYSICIAN ASSISTANT: Dr. Juanda Crumble fields and Gerri Lins PA-C  ANESTHESIA: General  EBL: 130 ml  Total I/O In: 1000 [I.V.:750; IV Piggyback:250] Out: 130 [Blood:130]  BLOOD ADMINISTERED: None  DRAINS: None  SPECIMEN: None  COUNTS CORRECT:  YES  PLAN OF CARE: PACU  PATIENT DISPOSITION:  PACU - hemodynamically stable  PROCEDURE DETAILS: The patient was taken to the operating placed supine position where the area of the right groin right leg were prepped and draped in usual sterile fashion.  SonoSite ultrasound was used to visualize the saphenous vein from the groin to the calf and the vein was patent but appeared to be small in diameter.  Vision was made over the femoral pulse and carried down the initial femoral and profundus femoris arteries.  The patient had extensive plaque on the anterior surface of the artery but good pulse down into the deep femoral artery.  The saphenous vein was identified at the saphenofemoral junction and tributary branch.  The saphenous vein was unroofed from the level of the groin down to mid calf.  It was a small to moderate size in the mid thigh and became smaller towards the calf.  The vein was ligated and divided in the distal calf and was cannulated and was gently dilated.  The vein was extremely small and was felt not to be adequate diameter for bypass.  The decision was made to proceed with a prosthetic graft.  The below-knee popliteal artery was exposed to the medial approach to the vein harvest incision in the artery did have some atherosclerotic  disease but was adequate for anastomosis.  A tunnel was created from the level of the below-knee popliteal artery to the groin.  The patient was given 8000 neutrophils of venous heparin and after adequate circulation time the common superficial femoral and profundus femoris are occluded.  The common femoral artery was occluded with a blue vessel loop Potts tie since it was to circumferentially calcified to closed with a arterial clamp.  The deep femoral artery was occluded with a may be a Gregory clamp.  The superficial femoral artery was chronically occluded.  An arteriotomy was made on the common femoral artery and continued down to the junction of the superficial femoral artery.  A 6 mm propatent Gore-Tex graft was brought into the field and was spatulated and sewn end-to-side to the Prolene suture.  This anastomosis was tested and S additional 6-0 Prolene suture was required for hemostasis.  The graft was flushed with heparinized saline and reoccluded.  The graft was then brought through the prior created tunnel down to the level of the graft was occluded.  The below-knee popliteal artery was occluded proximally distally and was opened with 11 blade and sent also with scissors.  The vertex graft was cut to the appropriate length and was spatulated.  Rings were removed from the anastomotic area.  The graft was sewn end-to-side to the below-knee popliteal artery with a running 6-0 Prolene suture.  Prior to completion of the closure the usual flushing maneuvers were undertaken anastomosis was completed.  Flow was restored to the foot and excellent  Doppler flow was noted.  The patient was given 100 mg of protamine to reverse the heparin.  Wounds were irrigated with saline.  Hemostasis has cautery.  Wounds were closed with 2-0 Vicryl in the groin and popliteal incisions.  The skin was closed with 3 oh and 4 septic or Vicryl sutures.  Attention was then turned to the right great toe.  There was gangrenous changes to  the toe.  There was no evidence of gangrene in the metatarsal head.  A fishmouth incision was made at the base of the great toe and this was carried down to the bone.  The metatarsal head was left intact.  The great toe bone was transected with bone shears and Francee Piccolo was used to smooth the edge and debride this back from the skin edge.  Bleeding at the amputation site.  The wound was irrigated with saline and hemostasis obtained with cautery.  The wound was closed with figure-of-eight 3-0 Vicryl suture to close the subcutaneous tissue over the bone.  The skin was then closed with 3-0 nylon mattress sutures.  A sterile dressing was applied and the patient was transferred to the recovery room in stable condition   Rosetta Posner, M.D., [facility] 08/11/2017 5:59 PM

## 2017-08-11 NOTE — Anesthesia Procedure Notes (Signed)
Procedure Name: Intubation Date/Time: 08/11/2017 12:56 PM Performed by: Eligha Bridegroom, CRNA Pre-anesthesia Checklist: Patient identified, Emergency Drugs available, Suction available, Patient being monitored and Timeout performed Patient Re-evaluated:Patient Re-evaluated prior to induction Oxygen Delivery Method: Circle system utilized Induction Type: IV induction Ventilation: Mask ventilation without difficulty Grade View: Grade II Tube type: Oral Tube size: 7.5 mm Number of attempts: 1 Airway Equipment and Method: Stylet Placement Confirmation: ETT inserted through vocal cords under direct vision,  positive ETCO2 and breath sounds checked- equal and bilateral Secured at: 21 cm Tube secured with: Tape Dental Injury: Teeth and Oropharynx as per pre-operative assessment

## 2017-08-11 NOTE — Interval H&P Note (Signed)
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 After consideration of risks, benefits and other options for treatment, the patient has consented to  Procedure(s): BYPASS GRAFT FEMORAL-POPLITEAL ARTERY RIGHT (Right) RIGHT GREAT TOE AMPUTATION DIGIT (Right) as a surgical intervention .  The patient's history has been reviewed, patient examined, no change in status, stable for surgery.  I have reviewed the patient's chart and labs.  Questions were answered to the patient's satisfaction.     Curt Jews

## 2017-08-11 NOTE — Progress Notes (Signed)
08/11/2017  1840 Received pt to room 4E-21 from PACU.  Pt is A&O, drowsy.  Tele monitor applied and CCMD notified.  Oriented to room, call light and bed.  Call bell in reach, family at bedside. [place]

## 2017-08-11 NOTE — Anesthesia Preprocedure Evaluation (Addendum)
Anesthesia Evaluation  Patient identified by MRN, date of birth, ID band Patient awake    Reviewed: Allergy & Precautions, NPO status , Patient's Chart, lab work & pertinent test results  Airway Mallampati: II  TM Distance: >3 FB Neck ROM: Full    Dental  (+) Edentulous Upper, Poor Dentition   Pulmonary shortness of breath, asthma , sleep apnea , former smoker,    breath sounds clear to auscultation       Cardiovascular hypertension, + Peripheral Vascular Disease and +CHF   Rhythm:Regular Rate:Tachycardia     Neuro/Psych    GI/Hepatic hiatal hernia, GERD  ,  Endo/Other  diabetes  Renal/GU Renal disease     Musculoskeletal  (+) Arthritis ,   Abdominal   Peds  Hematology  (+) anemia ,   Anesthesia Other Findings   Reproductive/Obstetrics                            Anesthesia Physical Anesthesia Plan  ASA: III  Anesthesia Plan: General   Post-op Pain Management:    Induction: Intravenous  PONV Risk Score and Plan: 4 or greater and Ondansetron, Dexamethasone and Treatment may vary due to age or medical condition  Airway Management Planned: Oral ETT  Additional Equipment:   Intra-op Plan:   Post-operative Plan: Extubation in OR  Informed Consent: I have reviewed the patients History and Physical, chart, labs and discussed the procedure including the risks, benefits and alternatives for the proposed anesthesia with the patient or authorized representative who has indicated his/her understanding and acceptance.     Plan Discussed with:   Anesthesia Plan Comments:        Anesthesia Quick Evaluation

## 2017-08-12 ENCOUNTER — Encounter (HOSPITAL_COMMUNITY): Payer: Self-pay | Admitting: Vascular Surgery

## 2017-08-12 ENCOUNTER — Encounter (HOSPITAL_COMMUNITY): Payer: Medicare Other

## 2017-08-12 LAB — RENAL FUNCTION PANEL
Albumin: 3.2 g/dL — ABNORMAL LOW (ref 3.5–5.0)
Anion gap: 17 — ABNORMAL HIGH (ref 5–15)
BUN: 48 mg/dL — ABNORMAL HIGH (ref 6–20)
CO2: 20 mmol/L — ABNORMAL LOW (ref 22–32)
Calcium: 8.8 mg/dL — ABNORMAL LOW (ref 8.9–10.3)
Chloride: 94 mmol/L — ABNORMAL LOW (ref 101–111)
Creatinine, Ser: 6.37 mg/dL — ABNORMAL HIGH (ref 0.44–1.00)
GFR calc Af Amer: 7 mL/min — ABNORMAL LOW (ref >60)
GFR calc non Af Amer: 6 mL/min — ABNORMAL LOW (ref >60)
Glucose, Bld: 214 mg/dL — ABNORMAL HIGH (ref 65–99)
Phosphorus: 7.1 mg/dL — ABNORMAL HIGH (ref 2.5–4.6)
Potassium: 5.8 mmol/L — ABNORMAL HIGH (ref 3.5–5.1)
Sodium: 131 mmol/L — ABNORMAL LOW (ref 135–145)

## 2017-08-12 LAB — BASIC METABOLIC PANEL
Anion gap: 16 — ABNORMAL HIGH (ref 5–15)
BUN: 38 mg/dL — ABNORMAL HIGH (ref 6–20)
CO2: 22 mmol/L (ref 22–32)
Calcium: 8.7 mg/dL — ABNORMAL LOW (ref 8.9–10.3)
Chloride: 97 mmol/L — ABNORMAL LOW (ref 101–111)
Creatinine, Ser: 5.46 mg/dL — ABNORMAL HIGH (ref 0.44–1.00)
GFR calc Af Amer: 9 mL/min — ABNORMAL LOW (ref >60)
GFR calc non Af Amer: 7 mL/min — ABNORMAL LOW (ref >60)
Glucose, Bld: 202 mg/dL — ABNORMAL HIGH (ref 65–99)
Potassium: 6.2 mmol/L — ABNORMAL HIGH (ref 3.5–5.1)
Sodium: 135 mmol/L (ref 135–145)

## 2017-08-12 LAB — CBC
HCT: 32.4 % — ABNORMAL LOW (ref 36.0–46.0)
Hemoglobin: 10.2 g/dL — ABNORMAL LOW (ref 12.0–15.0)
MCH: 31.1 pg (ref 26.0–34.0)
MCHC: 31.5 g/dL (ref 30.0–36.0)
MCV: 98.8 fL (ref 78.0–100.0)
Platelets: 274 10*3/uL (ref 150–400)
RBC: 3.28 MIL/uL — ABNORMAL LOW (ref 3.87–5.11)
RDW: 14.2 % (ref 11.5–15.5)
WBC: 15.1 10*3/uL — ABNORMAL HIGH (ref 4.0–10.5)

## 2017-08-12 LAB — GLUCOSE, CAPILLARY
Glucose-Capillary: 229 mg/dL — ABNORMAL HIGH (ref 65–99)
Glucose-Capillary: 118 mg/dL — ABNORMAL HIGH (ref 65–99)
Glucose-Capillary: 137 mg/dL — ABNORMAL HIGH (ref 65–99)

## 2017-08-12 MED ORDER — SODIUM CHLORIDE 0.9 % IV SOLN: 100.0000 mL | INTRAVENOUS | Status: DC | PRN

## 2017-08-12 MED ORDER — INSULIN ASPART 100 UNIT/ML ~~LOC~~ SOLN
0.0000 [IU] | Freq: Every day | SUBCUTANEOUS | Status: DC
  Administered 2017-08-15: 2 [IU] via SUBCUTANEOUS

## 2017-08-12 MED ORDER — PROMETHAZINE HCL 25 MG/ML IJ SOLN
12.5000 mg | Freq: Once | INTRAMUSCULAR | Status: AC
  Administered 2017-08-12: 12.5 mg via INTRAVENOUS
  Filled 2017-08-12: qty 1

## 2017-08-12 MED ORDER — INSULIN ASPART 100 UNIT/ML ~~LOC~~ SOLN
0.0000 [IU] | Freq: Three times a day (TID) | SUBCUTANEOUS | Status: DC
  Administered 2017-08-12: 3 [IU] via SUBCUTANEOUS
  Administered 2017-08-13 – 2017-08-15 (×3): 2 [IU] via SUBCUTANEOUS
  Administered 2017-08-15 – 2017-08-16 (×3): 1 [IU] via SUBCUTANEOUS

## 2017-08-12 MED ORDER — LIDOCAINE-PRILOCAINE 2.5-2.5 % EX CREA
1.0000 "application " | TOPICAL_CREAM | CUTANEOUS | Status: DC | PRN
  Filled 2017-08-12: qty 5

## 2017-08-12 MED ORDER — AMLODIPINE BESYLATE 5 MG PO TABS
5.0000 mg | ORAL_TABLET | Freq: Every day | ORAL | Status: DC
  Administered 2017-08-12 – 2017-08-15 (×4): 5 mg via ORAL
  Filled 2017-08-12 (×4): qty 1

## 2017-08-12 MED ORDER — PENTAFLUOROPROP-TETRAFLUOROETH EX AERO: 1.0000 "application " | INHALATION_SPRAY | CUTANEOUS | Status: DC | PRN

## 2017-08-12 MED ORDER — ONDANSETRON HCL 4 MG/2ML IJ SOLN
4.0000 mg | Freq: Once | INTRAMUSCULAR | Status: AC
  Administered 2017-08-12: 4 mg via INTRAVENOUS

## 2017-08-12 MED ORDER — LIDOCAINE HCL (PF) 1 % IJ SOLN: 5.0000 mL | INTRAMUSCULAR | Status: DC | PRN

## 2017-08-12 MED ORDER — DOXERCALCIFEROL 4 MCG/2ML IV SOLN
6.0000 ug | INTRAVENOUS | Status: DC
  Administered 2017-08-13: 6 ug via INTRAVENOUS
  Filled 2017-08-12 (×2): qty 4

## 2017-08-12 MED ORDER — ONDANSETRON HCL 4 MG/2ML IJ SOLN
INTRAMUSCULAR | Status: AC
  Filled 2017-08-12: qty 2

## 2017-08-12 MED ORDER — HEPARIN SODIUM (PORCINE) 1000 UNIT/ML DIALYSIS
1000.0000 [IU] | INTRAMUSCULAR | Status: DC | PRN
  Filled 2017-08-12: qty 1

## 2017-08-12 MED ORDER — HYDRALAZINE HCL 10 MG PO TABS
10.0000 mg | ORAL_TABLET | Freq: Three times a day (TID) | ORAL | Status: DC
  Administered 2017-08-12: 10 mg via ORAL
  Filled 2017-08-12: qty 1

## 2017-08-12 NOTE — Evaluation (Signed)
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  PMH - htn, dm, asthma, anxiety, chf, ckd, LLE bypass graft, ESRD on HD, lt transmet amputation   Clinical Impression   Pt lives with her husband and is independent in ADL. She presents with generalized weakness and poor standing balance. Pt with episode of vomiting upon initially sitting up, but continued to agree with getting up to chair. Pt requires min to mod assist for mobility and set up to total assist for ADL. She has excellent potential to return to a modified independent level in ADL with intensive rehab. Will follow acutely.    Follow Up Recommendations  CIR    Equipment Recommendations  (defer to next venue)    Recommendations for Other Services       Precautions / Restrictions Precautions Precautions: Fall Restrictions Weight Bearing Restrictions: Yes RLE Weight Bearing: Weight bearing as tolerated(through heel)      Mobility Bed Mobility Overal bed mobility: Needs Assistance Bed Mobility: Supine to Sit     Supine to sit: Mod assist     General bed mobility comments: assist for R LE to EOB, to raise trunk and position hips at EOB  Transfers Overall transfer level: Needs assistance Equipment used: Rolling walker (2 wheeled) Transfers: Sit to/from [facility] Sit to Stand: Mod assist Stand pivot transfers: Min assist       General transfer comment: assist to rise and gain balance, cues for hand and L foot placement    Balance Overall balance assessment: Needs assistance Sitting-balance support: No upper extremity supported Sitting balance-Leahy Scale: Good       Standing balance-Leahy Scale: Poor                             ADL  either performed or assessed with clinical judgement   ADL Overall ADL's : Needs assistance/impaired Eating/Feeding: Independent;Sitting   Grooming: Wash/dry face;Sitting;Set up   Upper Body Bathing: Supervision/ safety;Sitting   Lower Body Bathing: Maximal assistance;Sit to/from stand   Upper Body Dressing : Set up;Sitting   Lower Body Dressing: Total assistance;Sit to/from stand   Toilet Transfer: Minimal assistance;Stand-pivot;RW [employer] forces technical officer Details (indicate cue type and reason): simulated to chair Toileting- Clothing Manipulation and Hygiene: Minimal assistance;Sit to/from stand         General ADL Comments: Pt vomited upon reaching EOB, RN aware and gave meds     Vision Baseline Vision/History: Wears glasses Wears Glasses: At all times Patient Visual Report: No change from baseline       Perception     Praxis      Pertinent Vitals/Pain Pain Assessment: Faces Faces Pain Scale: Hurts little more Pain Location: R LE Pain Descriptors / Indicators: Sore Pain Intervention(s): Monitored during session;Premedicated before session;Repositioned     Hand Dominance Right   Extremity/Trunk Assessment Upper Extremity Assessment Upper Extremity Assessment: Generalized weakness   Lower Extremity Assessment Lower Extremity Assessment: Defer to PT evaluation       Communication Communication Communication: No difficulties   Cognition Arousal/Alertness: Awake/alert Behavior During Therapy: WFL for tasks assessed/performed Overall Cognitive Status: Within Functional Limits for tasks assessed  General Comments       Exercises     Shoulder Instructions      Home Living Family/patient expects to be discharged to:: Inpatient rehab Living Arrangements: Spouse/significant other Available Help at Discharge: Family;Available 24 hours/day Type of Home: House Home Access: Stairs to enter [employer]  of Steps: 2 Entrance Stairs-Rails: Left Home Layout: Multi-level;Bed/bath upstairs Alternate Level Stairs-Number of Steps: 6 and 6 split level Alternate Level Stairs-Rails: Left Bathroom Shower/Tub: Tub/shower unit;Curtain   Bathroom Toilet: Standard Bathroom Accessibility: No   Home Equipment: Walker - 4 wheels;Cane - single point;Shower seat;Crutches          Prior Functioning/Environment Level of Independence: Independent with assistive device(s)        Comments: walked with cane        OT Problem List: Decreased strength;Decreased activity tolerance;Impaired balance (sitting and/or standing);Decreased knowledge of use of DME or AE;Pain      OT Treatment/Interventions: Self-care/ADL training;DME and/or AE instruction;Therapeutic activities;Patient/family education;Balance training    OT Goals(Current goals can be found in the care plan section) Acute Rehab OT Goals Patient Stated Goal: to get stronger and then return home OT Goal Formulation: With patient Time For Goal Achievement: 08/26/17 Potential to Achieve Goals: Good ADL Goals Pt Will Perform Grooming: with modified independence;standing Pt Will Perform Lower Body Bathing: with modified independence;sit to/from stand Pt Will Perform Lower Body Dressing: with modified independence;sit to/from stand Pt Will Transfer to Toilet: with modified independence;ambulating;bedside commode Pt Will Perform Toileting - Clothing Manipulation and hygiene: with modified independence;sit to/from stand  OT Frequency: Min 2X/week   Barriers to D/C:            Co-evaluation PT/OT/SLP Co-Evaluation/Treatment: Yes     OT goals addressed during session: ADL's and self-care      AM-PAC PT "6 Clicks" Daily Activity     Outcome Measure Help from another person eating meals?: None Help from another person taking care of personal grooming?: A Little Help from another person toileting, which includes using toliet, bedpan, or  urinal?: A Little Help from another person bathing (including washing, rinsing, drying)?: A Lot Help from another person to put on and taking off regular upper body clothing?: A Little Help from another person to put on and taking off regular lower body clothing?: A Lot 6 Click Score: 17   End of Session Equipment Utilized During Treatment: Gait belt;Rolling walker;Other (comment)(darco shoe) Nurse Communication: Mobility status;Other (comment)(aware pt vomited)  Activity Tolerance: Patient tolerated treatment well Patient left: in chair;with call bell/phone within reach;with chair alarm set  OT Visit Diagnosis: Unsteadiness on feet (R26.81);Pain                Time: 1201-1229 OT Time Calculation (min): 28 min Charges:  OT General Charges $OT Visit: 1 Visit OT Evaluation $OT Eval Moderate Complexity: 1 Mod G-Codes:     08-24-2017 Nestor Lewandowsky, OTR/L Pager: [phone]  Werner Lean, Haze Boyden 08-24-17, 1:24 PM

## 2017-08-12 NOTE — Consult Note (Signed)
[facility] Renal Consultation Note    Indication for Consultation:  Management of ESRD/hemodialysis; anemia, hypertension/volume and secondary hyperparathyroidism  GDJ:[id number], Nicki Reaper, MD  HPI: Destiny Day is a 67 y.o. female. ESRD 2/2 HTN on HD MWF at [facility], first starting on 10/11/2015.  Past medical history significant for HTN, DM, HLD, h/o GIB, GERD, grade 2 diastolic dysfunction, arthritis and h/o PAD with L transmetatarsal amp 09/01/16.    Destiny Day has been admitted for scheduled right femoral to below knee popliteal bypass and right great toe amputation due to right foot ischemia with gangrene of right great toe.  She received an extra dialysis on Tuesday in preparation for the procedure on Wednesday, which was tolerated well, and EDW was reached.  Of note, patient is compliant with prescribed dialysis treatments, tolerating well, without complications.  Seen and examined at bedside. States pain in her foot has lessened.  Admits to nausea, "wooziness" and vomiting x2, once during my exam.  Denies edema, chest pain, SOB, abdominal pain, diarrhea, and constipation.    Past Medical History:  Diagnosis Date  . Abdominal bruit   . Anemia   . Anxiety   . Arthritis    Osteoarthritis  . Asthma   . Cervical disc disease    "pinced nerve"  . CHF (congestive heart failure) ([place])   . Complication of anesthesia    " after I got home from my last procedure, I started itching."  . Diabetes mellitus    Type II  . Diverticulitis   . ESRD on peritoneal dialysis [facility])    Hemodialysis - MWF- [place]   . GERD (gastroesophageal reflux disease)    from medications  . GI bleed 03/31/2013  . History of hiatal hernia   . Hyperlipidemia   . Hypertension   . Neuropathy    left leg  . Osteoporosis   . Peripheral vascular disease ([place])   . Pneumonia    "very young"  . Seasonal allergies   . Shortness of breath dyspnea    WIth exertion  . Sleep apnea    can't afford cpap    Past Surgical History:  Procedure Laterality Date  . A/V SHUNTOGRAM N/A 09/22/2016   Procedure: A/V Shuntogram - left arm;  Surgeon: Serafina Mitchell, MD;  Location: [place] CV LAB;  Service: Cardiovascular;  Laterality: N/A;  . ABDOMINAL HYSTERECTOMY  1993`  . AMPUTATION Left 09/01/2016   Procedure: LEFT FOOT TRANSMETATARSAL AMPUTATION;  Surgeon: Newt Minion, MD;  Location: [place];  Service: Orthopedics;  Laterality: Left;  . AV FISTULA PLACEMENT Left 04/21/2016   Procedure: INSERTION OF ARTERIOVENOUS (AV) GORE-TEX GRAFT ARM LEFT;  Surgeon: Elam Dutch, MD;  Location: [place];  Service: Vascular;  Laterality: Left;  . [place] TRANSPOSITION Left 07/10/2014   Procedure: BASCILIC VEIN TRANSPOSITION;  Surgeon: Angelia Mould, MD;  Location: [place];  Service: Vascular;  Laterality: Left;  . BASCILIC VEIN TRANSPOSITION Right 11/08/2014   Procedure: FIRST STAGE BASILIC VEIN TRANSPOSITION;  Surgeon: Angelia Mould, MD;  Location: [place];  Service: Vascular;  Laterality: Right;  . BASCILIC VEIN TRANSPOSITION Right 01/18/2015   Procedure: SECOND STAGE BASILIC VEIN TRANSPOSITION;  Surgeon: Angelia Mould, MD;  Location: [facility] OR;  Service: Vascular;  Laterality: Right;  . ESOPHAGOGASTRODUODENOSCOPY N/A 03/31/2013   Procedure: ESOPHAGOGASTRODUODENOSCOPY (EGD);  Surgeon: Gatha Mayer, MD;  Location: [facility] ENDOSCOPY;  Service: Endoscopy;  Laterality: N/A;  . EYE SURGERY     laser surgery  .  FEMORAL-POPLITEAL BYPASS GRAFT Left 07/08/2016   Procedure: LEFT  FEMORAL-BELOW KNEE POPLITEAL ARTERY BYPASS GRAFT USING 6MM X 80 CM PROPATEN GORETEX GRAFT WITH RINGS.;  Surgeon: Rosetta Posner, MD;  Location: [place];  Service: Vascular;  Laterality: Left;  . FISTULOGRAM Left 10/29/2014   Procedure: FISTULOGRAM;  Surgeon: Angelia Mould, MD;  Location[facility] C[facility] LAB;  Service: Cardiovascular;  Laterality: Left;  . LIGATION OF ARTERIOVENOUS  FISTULA Left 04/21/2016   Procedure: LIGATION OF  ARTERIOVENOUS  FISTULA LEFT ARM;  Surgeon: Elam Dutch, MD;  Location: [place];  Service: Vascular;  Laterality: Left;  . LOWER EXTREMITY ANGIOGRAPHY N/A 04/06/2017   Procedure: Lower Extremity Angiography - Right;  Surgeon: Serafina Mitchell, MD;  Location: [place] CV LAB;  Service: Cardiovascular;  Laterality: N/A;  . PATCH ANGIOPLASTY Right 01/18/2015   Procedure: BASILIC VEIN PATCH ANGIOPLASTY USING VASCUGUARD PATCH;  Surgeon: Angelia Mould, MD;  Location: [place];  Service: Vascular;  Laterality: Right;  . PERIPHERAL VASCULAR BALLOON ANGIOPLASTY  09/22/2016   Procedure: Peripheral Vascular Balloon Angioplasty;  Surgeon: Serafina Mitchell, MD;  Location: [place] CV LAB;  Service: Cardiovascular;;  Lt. Fistula  . PERIPHERAL VASCULAR CATHETERIZATION N/A 06/23/2016   Procedure: Abdominal Aortogram w/Lower Extremity;  Surgeon: Serafina Mitchell, MD;  Location: [place] CV LAB;  Service: Cardiovascular;  Laterality: N/A;  . PERIPHERAL VASCULAR CATHETERIZATION  06/23/2016   Procedure: Peripheral Vascular Intervention;  Surgeon: Serafina Mitchell, MD;  Location: [place] CV LAB;  Service: Cardiovascular;;  lt common and external illiac artery   Family History  Problem Relation Age of Onset  . Other Mother        not sure of cause of death  . Diabetes Father   . Pancreatic cancer Maternal Grandmother   . Colon cancer Neg Hx    Social History:  reports that she quit smoking about 5 years ago. Her smoking use included cigarettes. She has a 14.00 pack-year smoking history. she has never used smokeless tobacco. She reports that she does not drink alcohol or use drugs. Allergies  Allergen Reactions  . Lisinopril Cough  . Prednisone Other (See Comments)    Excessive fluid buildup   Prior to Admission medications   Medication Sig Start Date End Date Taking? Authorizing Provider  acetaminophen (TYLENOL) 500 MG tablet Take 500 mg by mouth every 6 (six) hours as needed.   Yes [provider]  albuterol (PROVENTIL HFA;VENTOLIN HFA) 108 (90 BASE) MCG/ACT inhaler Inhale 1-2 puffs into the lungs every 6 (six) hours as needed for wheezing or shortness of breath.   Yes [provider]  amLODipine (NORVASC) 10 MG tablet Take 10 mg by mouth at bedtime.    Yes [provider]  aspirin 81 MG chewable tablet Chew 81 mg by mouth every evening.    Yes [provider]  atorvastatin (LIPITOR) 20 MG tablet Take 20 mg by mouth at bedtime.    Yes [provider]  budesonide-formoterol (SYMBICORT) 160-4.5 MCG/ACT inhaler Inhale 1 puff into the lungs daily as needed (for respiratory issues).    Yes [provider]  calcium acetate (PHOSLO) 667 MG capsule Take 667-1,334 mg by mouth See admin instructions. 1 tablet twice daily with snacks. And 2 tablets 3 times daily with meals   Yes [provider]  carvedilol (COREG) 12.5 MG tablet Take 12.5 mg by mouth 2 (two) times daily.  08/03/16  Yes [provider]  cinacalcet (SENSIPAR) 30 MG  tablet Take 30 mg by mouth daily with supper.    Yes [provider]  clopidogrel (PLAVIX) 75 MG tablet TAKE 1 TABLET EVERY DAY 07/12/17  Yes Waynetta Sandy, MD  diazepam (VALIUM) 5 MG tablet Take 5 mg by mouth every 12 (twelve) hours as needed for anxiety.   Yes [provider]  ferric citrate (AURYXIA) 1 GM 210 MG(Fe) tablet Take 420 mg by mouth 3 (three) times daily with meals.   Yes [provider]  fluticasone (FLONASE) 50 MCG/ACT nasal spray Place 1 spray into both nostrils daily as needed for allergies or rhinitis.   Yes [provider]  gabapentin (NEURONTIN) 100 MG capsule Take 1 capsule (100 mg total) by mouth at bedtime. When necessary for neuropathy pain 05/06/17  Yes Newt Minion, MD  HYDROcodone-acetaminophen (NORCO) 5-325 MG tablet Take 1 tablet by mouth every 6 (six) hours as needed for moderate pain. 08/06/17  Yes Conrad [place], MD  insulin  lispro (HUMALOG) 100 UNIT/ML injection Inject 0-8 Units into the skin 3 (three) times daily before meals. 0-59 = hypoglycemic protocol, 60-149 = 0 units, 150-250 = 5 units, 251-300 = 8 units, >301 notify MD/NP   Yes [provider]  isosorbide dinitrate (ISORDIL) 20 MG tablet Take 1 tablet by mouth See admin instructions. Take 1 tablet daily on dialysis days and 1 tab twice daily on non dialysis days 08/03/17  Yes [provider]  LEVEMIR FLEXTOUCH 100 UNIT/ML Pen Inject 10 Units into the skin at bedtime.  01/16/15  Yes [provider]  multivitamin (RENA-VIT) TABS tablet Take 1 tablet by mouth daily.  01/24/15  Yes [provider]  omeprazole (PRILOSEC) 20 MG capsule Take 20 mg by mouth daily.    Yes [provider]  sucroferric oxyhydroxide (VELPHORO) 500 MG chewable tablet Chew 500-1,000 mg by mouth See admin instructions. Take 2 tablets (1000 mg) by mouth with meals and 1 tablet (500 mg) with snacks   Yes [provider]  hydrALAZINE (APRESOLINE) 25 MG tablet Take 1 tablet by mouth See admin instructions. Take 1 tab 3 times a day on dialysis days and 1 tab twice daily on non dialysis days 08/03/17   [provider]  loratadine (CLARITIN) 10 MG tablet Take 10 mg by mouth daily as needed for allergies.    [provider]   Current Facility-Administered Medications  Medication Dose Route Frequency Provider Last Rate Last Dose  . 0.9 %  sodium chloride infusion  500 mL Intravenous Once PRN Laurence Slate M, PA-C      . 0.9 %  sodium chloride infusion   Intravenous Continuous Laurence Slate M, PA-C      . acetaminophen (TYLENOL) tablet 325-650 mg  325-650 mg Oral Q4H PRN Laurence Slate M, PA-C       Or  . acetaminophen (TYLENOL) suppository 325-650 mg  325-650 mg Rectal Q4H PRN Laurence Slate M, PA-C      . albuterol (PROVENTIL) (2.5 MG/3ML) 0.083% nebulizer solution 2.5 mg  2.5 mg Inhalation Q6H PRN Laurence Slate M, PA-C      . alum & mag  hydroxide-simeth (MAALOX/MYLANTA) 200-200-20 MG/5ML suspension 15-30 mL  15-30 mL Oral Q2H PRN Laurence Slate M, PA-C      . amLODipine (NORVASC) tablet 10 mg  10 mg Oral QHS Laurence Slate M, PA-C   10 mg at 08/11/17 2222  . aspirin chewable tablet 81 mg  81 mg Oral QPM Ulyses Amor, PA-C      .  atorvastatin (LIPITOR) tablet 20 mg  20 mg Oral QHS Laurence Slate M, PA-C   20 mg at 08/11/17 2223  . bisacodyl (DULCOLAX) EC tablet 5 mg  5 mg Oral Daily PRN Laurence Slate M, PA-C      . calcium acetate (PHOSLO) capsule 1,334 mg  1,334 mg Oral TID WC Ulyses Amor, PA-C   1,334 mg at 08/12/17 0957  . carvedilol (COREG) tablet 12.5 mg  12.5 mg Oral BID Laurence Slate M, PA-C   12.5 mg at 08/12/17 3151  . cefUROXime (ZINACEF) 1.5 g in dextrose 5 % 50 mL IVPB  1.5 g Intravenous Q12H Ulyses Amor, PA-C   Stopped at 08/12/17 0050  . cinacalcet (SENSIPAR) tablet 30 mg  30 mg Oral Q supper Laurence Slate M, PA-C      . clopidogrel (PLAVIX) tablet 75 mg  75 mg Oral Daily Laurence Slate M, PA-C   75 mg at 08/12/17 7616  . diazepam (VALIUM) tablet 5 mg  5 mg Oral Q12H PRN Laurence Slate M, PA-C      . docusate sodium (COLACE) capsule 100 mg  100 mg Oral Daily Laurence Slate M, PA-C   100 mg at 08/12/17 0737  . ferric citrate (AURYXIA) tablet 420 mg  420 mg Oral TID WC Ulyses Amor, PA-C   420 mg at 08/12/17 0959  . fluticasone (FLONASE) 50 MCG/ACT nasal spray 1 spray  1 spray Each Nare Daily PRN Ulyses Amor, PA-C      . gabapentin (NEURONTIN) capsule 100 mg  100 mg Oral QHS Laurence Slate M, PA-C   100 mg at 08/11/17 2220  . guaiFENesin-dextromethorphan (ROBITUSSIN DM) 100-10 MG/5ML syrup 15 mL  15 mL Oral Q4H PRN Laurence Slate M, PA-C      . heparin injection 5,000 Units  5,000 Units Subcutaneous Q8H Laurence Slate M, PA-C   5,000 Units at 08/12/17 1062  . hydrALAZINE (APRESOLINE) injection 5 mg  5 mg Intravenous Q20 Min PRN Laurence Slate M, PA-C      . hydrALAZINE (APRESOLINE) tablet 25 mg  25 mg Oral TID  Ulyses Amor, PA-C   25 mg at 08/12/17 6948  . HYDROcodone-acetaminophen (NORCO/VICODIN) 5-325 MG per tablet 1-2 tablet  1-2 tablet Oral Q4H PRN Ulyses Amor, PA-C   2 tablet at 08/12/17 1055  . insulin aspart (novoLOG) injection 0-5 Units  0-5 Units Subcutaneous QHS Collins, Emma M, PA-C      . insulin aspart (novoLOG) injection 0-9 Units  0-9 Units Subcutaneous TID WC Collins, Emma M, PA-C      . insulin detemir (LEVEMIR) injection 10 Units  10 Units Subcutaneous QHS Ulyses Amor, [place]   10 Units at 08/11/17 2220  . isosorbide dinitrate (ISORDIL) tablet 20 mg  20 mg Oral BID Laurence Slate M, PA-C   20 mg at 08/12/17 5462  . labetalol (NORMODYNE,TRANDATE) injection 10 mg  10 mg Intravenous Q10 min PRN Laurence Slate M, PA-C      . loratadine (CLARITIN) tablet 10 mg  10 mg Oral Daily PRN Laurence Slate M, PA-C      . magnesium sulfate IVPB 2 g 50 mL  2 g Intravenous Daily PRN Laurence Slate M, PA-C      . metoprolol tartrate (LOPRESSOR) injection 2-5 mg  2-5 mg Intravenous Q2H PRN Laurence Slate M, PA-C      . mometasone-formoterol (DULERA) 200-5 MCG/ACT inhaler 2 puff  2 puff Inhalation BID Ulyses Amor, PA-C   2  puff at 08/12/17 0753  . morphine 2 MG/ML injection 1-2 mg  1-2 mg Intravenous Q1H PRN Laurence Slate M, PA-C      . multivitamin (RENA-VIT) tablet 1 tablet  1 tablet Oral QHS Ulyses Amor, PA-C   1 tablet at 08/11/17 2223  . ondansetron (ZOFRAN) injection 4 mg  4 mg Intravenous Q6H PRN Laurence Slate M, PA-C      . pantoprazole (PROTONIX) EC tablet 40 mg  40 mg Oral Daily Laurence Slate M, PA-C   40 mg at 08/12/17 3299  . phenol (CHLORASEPTIC) mouth spray 1 spray  1 spray Mouth/Throat PRN Laurence Slate M, PA-C      . potassium chloride SA (K-DUR,KLOR-CON) CR tablet 20-40 mEq  20-40 mEq Oral Daily PRN Laurence Slate M, PA-C      . senna-docusate (Senokot-S) tablet 1 tablet  1 tablet Oral QHS PRN Ulyses Amor, PA-C      . sucroferric oxyhydroxide [facility]) chewable tablet 1,000  mg  1,000 mg Oral TID WC Laurence Slate M, PA-C   1,000 mg at 08/12/17 1000  . sucroferric oxyhydroxide (VELPHORO) chewable tablet 500 mg  500 mg Oral With snacks Rosetta Posner, MD       Labs: Basic Metabolic Panel: Recent Labs  Lab 08/11/17 0933 08/12/17 0220  NA 138 135  K 3.6 6.2*  CL 104 97*  CO2 23 22  GLUCOSE 85 202*  BUN 20 38*  CREATININE 3.48* 5.46*  CALCIUM 7.7* 8.7*   Liver Function Tests: Recent Labs  Lab 08/11/17 0933  AST 18  ALT 12*  ALKPHOS 144*  BILITOT 0.5  PROT 6.2*  ALBUMIN 2.7*   CBC: Recent Labs  Lab 08/11/17 0800 08/12/17 0220  WBC 9.4 15.1*  HGB 13.6 10.2*  HCT 42.3 32.4*  MCV 97.7 98.8  PLT 341 274   Cardiac Enzymes: No results for input(s): CKTOTAL, CKMB, CKMBINDEX, TROPONINI in the last 168 hours. CBG: Recent Labs  Lab 08/11/17 1015 08/11/17 1220 08/11/17 1621 08/11/17 2219 08/12/17 1129  GLUCAP 99 83 76 152* 229*   ROS: All others negative except those listed in HPI.  Physical Exam: Vitals:   08/11/17 2219 08/11/17 2317 08/12/17 0447 08/12/17 0800  BP: (!) 129/54 (!) 131/56 (!) 112/50 114/60  Pulse:  86 86 82  Resp:  17 19 14   Temp:  97.9 F (36.6 C) 97.8 F (36.6 C) 97.7 F (36.5 C)  TempSrc:  Oral Oral Oral  SpO2:  98% 100% 98%  Weight:   68.3 kg (150 lb 9.2 oz)   Height:         General: WDWN, NAD, pleasant female, resting in bedside chair Head: NCAT, sclera not icteric, MMM, wearing glasses Neck: Supple. No lymphadenopathy. No JVD Lungs: CTAB. No wheeze, rales or rhonchi. nml WOB Heart: RRR. +2/4 systolic murmur heard throughout pericardium, no rub or gallop Abdomen: soft, nontender, +BS, no guarding, no rebound tenderness Lower extremities:no edema b/l, +ischemic changes b/l. R foot bandaged, in boot Neuro: A&Ox3. Moves all extremities spontaneously. Psych:  Responds to questions appropriately with a normal affect. Dialysis Access: LU AVG +b/t  Dialysis Orders:  MWF - [place]  4hrs, BFR 400, DFR 800,  EDW  66.5kg, 3.0K/ 2.25Ca, Profile 4  Access: LU AVG   Heparin 2500 Unit bolus, intermittent 1000 units mid run Venofer 50mg  IV qwk - last 1/11 Mircera 33mcg IV q2wks - last 1/09   Assessment/Plan: 1.  R foot ischemia w/gangrene R toe - s/p R fem-pop  bypass & R great toe amputation, Dr. Oneida Alar, 1/16. Some bleeding from incision sites & toe.  WOunds dressed, Darco shoe on R. VVS following.  2.  ESRD -  HD planned for today d/t elevated K 6.1. Plan for 2nd shift tomorrow to return to regular schedule. NO HEPARIN. 3.  Hypertension/volume  - BP soft, reduced hydralazine and amlodipine. Close to EDW, does appear to be volume overloaded. Titrate down volume as tolerated.  4.  Anemia  - Hgb 13.6>10.2. On ESA as outpatient. Give 62mcg Aranesp qwk start Friday. 5.  Secondary Hyperparathyroidism -  Ca 8.8. P 7.1. Continue binders, VDRA, and sensipar. 6.  Nutrition - Alb 3.2, Renal diet w/fluid restrictions. Renavite. Nepro. 7. DM - on insulin, per primary 8. CHF - per primary  Jen Mow, PA-C [facility] Pager: [phone] 08/12/2017, 11:57 AM

## 2017-08-12 NOTE — Procedures (Signed)
I was present at this dialysis session. I have reviewed the session itself and made appropriate changes.   2K bath. UF goal 1.5L.    Filed Weights   08/11/17 0847 08/12/17 0447  Weight: 68.5 kg (151 lb) 68.3 kg (150 lb 9.2 oz)    Recent Labs  Lab 08/12/17 0220  NA 135  K 6.2*  CL 97*  CO2 22  GLUCOSE 202*  BUN 38*  CREATININE 5.46*  CALCIUM 8.7*    Recent Labs  Lab 08/11/17 0800 08/12/17 0220  WBC 9.4 15.1*  HGB 13.6 10.2*  HCT 42.3 32.4*  MCV 97.7 98.8  PLT 341 274    Scheduled Meds: . amLODipine  10 mg Oral QHS  . aspirin  81 mg Oral QPM  . atorvastatin  20 mg Oral QHS  . calcium acetate  1,334 mg Oral TID WC  . carvedilol  12.5 mg Oral BID  . cinacalcet  30 mg Oral Q supper  . clopidogrel  75 mg Oral Daily  . docusate sodium  100 mg Oral Daily  . [START ON 08/13/2017] doxercalciferol  6 mcg Intravenous Q M,W,F-HD  . ferric citrate  420 mg Oral TID WC  . gabapentin  100 mg Oral QHS  . heparin  5,000 Units Subcutaneous Q8H  . hydrALAZINE  25 mg Oral TID  . insulin aspart  0-5 Units Subcutaneous QHS  . insulin aspart  0-9 Units Subcutaneous TID WC  . insulin detemir  10 Units Subcutaneous QHS  . isosorbide dinitrate  20 mg Oral BID  . mometasone-formoterol  2 puff Inhalation BID  . multivitamin  1 tablet Oral QHS  . pantoprazole  40 mg Oral Daily  . sucroferric oxyhydroxide  1,000 mg Oral TID WC  . sucroferric oxyhydroxide  500 mg Oral With snacks   Continuous Infusions: . sodium chloride    . sodium chloride    . magnesium sulfate 1 - 4 g bolus IVPB     PRN Meds:.sodium chloride, acetaminophen **OR** acetaminophen, albuterol, alum & mag hydroxide-simeth, bisacodyl, diazepam, fluticasone, guaiFENesin-dextromethorphan, hydrALAZINE, HYDROcodone-acetaminophen, labetalol, loratadine, magnesium sulfate 1 - 4 g bolus IVPB, metoprolol tartrate, morphine injection, ondansetron, phenol, potassium chloride, senna-docusate   Pearson Grippe  MD 08/12/2017, 2:40  PM

## 2017-08-12 NOTE — Progress Notes (Signed)
R foot dressing with sanguinous saturation.  Dressing changed.  Mild skin edge oozing. Patient also complaining of R groin pain.  Incision appears intact without bleeding or hematoma.  Try gauze placed in fold of groin. Nursing staff monitoring nausea after phenergan given

## 2017-08-12 NOTE — Progress Notes (Signed)
Orthopedic Tech Progress Note Patient Details:  Destiny Day 11/30/1950 [id number]  Ortho Devices Type of Ortho Device: Darco shoe Ortho Device/Splint Location: drop off   Post Interventions Instructions Provided: Care of device   [name] 08/12/2017, 11:13 AM

## 2017-08-12 NOTE — Evaluation (Signed)
Physical Therapy Evaluation Patient Details Name: Destiny Day MRN: [id number] DOB: 1951-02-23 Today's Date: 08/12/2017   History of Present Illness  Pt adm with right foot ischemia with gangrene right great toe and underwent rt great toe amputation and RLE bypass graft on 1/16.  PMH - htn, dm, asthma, anxiety, chf, ckd, LLE bypass graft, ESRD on HD, lt transmet amputation  Clinical Impression  Pt presents to PT with decr mobility and balance after rt great toe amputation. Expect pt will make steady progress. Pt's husband works so pt will need to be more independent prior to return home. Pt motivated to work toward independence. Pt with nausea and vomiting upon sitting up on bed but was agreeable to getting up to chair. Feel pt would be a excellent candidate for CIR.     Follow Up Recommendations CIR    Equipment Recommendations  None recommended by PT    Recommendations for Other Services Rehab consult     Precautions / Restrictions Precautions Precautions: Fall Required Braces or Orthoses: Other Brace/Splint Other Brace/Splint: Darco shoe on Rt Restrictions Weight Bearing Restrictions: Yes RLE Weight Bearing: Weight bearing as tolerated(through heel)      Mobility  Bed Mobility Overal bed mobility: Needs Assistance Bed Mobility: Supine to Sit     Supine to sit: Mod assist;HOB elevated     General bed mobility comments: assist for R LE to EOB, to raise trunk and position hips at EOB  Transfers Overall transfer level: Needs assistance Equipment used: Rolling walker (2 wheeled) Transfers: Sit to/from [facility] Sit to Stand: Mod assist Stand pivot transfers: Min assist       General transfer comment: assist to rise and gain balance, cues for hand and L foot placement. Bed to chair with small pivotal steps with walker. Unsteady requiring assist for balance.  Ambulation/Gait             General Gait Details: Not attempted more than bed to  chair due to nausea and vomiting.  Stairs            Wheelchair Mobility    Modified Rankin (Stroke Patients Only)       Balance Overall balance assessment: Needs assistance Sitting-balance support: No upper extremity supported Sitting balance-Leahy Scale: Good     Standing balance support: Bilateral upper extremity supported Standing balance-Leahy Scale: Poor Standing balance comment: walker and min assist for static standing                             Pertinent Vitals/Pain Pain Assessment: Faces Faces Pain Scale: Hurts little more Pain Location: R LE Pain Descriptors / Indicators: Sore Pain Intervention(s): Monitored during session;Premedicated before session;Repositioned    Home Living Family/patient expects to be discharged to:: Inpatient rehab Living Arrangements: Spouse/significant other Available Help at Discharge: Family;Available 24 hours/day Type of Home: House Home Access: Stairs to enter Entrance Stairs-Rails: Left Entrance Stairs-Number of Steps: 2 Home Layout: Multi-level;Bed/bath upstairs Home Equipment: Walker - 4 wheels;Cane - single point;Shower seat;Crutches      Prior Function Level of Independence: Independent with assistive device(s)         Comments: walked with cane     Hand Dominance   Dominant Hand: Right    Extremity/Trunk Assessment   Upper Extremity Assessment Upper Extremity Assessment: Defer to OT evaluation    Lower Extremity Assessment Lower Extremity Assessment: Generalized weakness       Communication   Communication: No  difficulties  Cognition Arousal/Alertness: Awake/alert Behavior During Therapy: WFL for tasks assessed/performed Overall Cognitive Status: Within Functional Limits for tasks assessed                                        General Comments      Exercises     Assessment/Plan    PT Assessment Patient needs continued PT services  PT Problem List Decreased  strength;Decreased activity tolerance;Decreased balance;Decreased mobility;Pain       PT Treatment Interventions DME instruction;Gait training;Stair training;Functional mobility training;Therapeutic activities;Therapeutic exercise;Balance training;Patient/family education    PT Goals (Current goals can be found in the Care Plan section)  Acute Rehab PT Goals Patient Stated Goal: to get stronger and then return home PT Goal Formulation: With patient Time For Goal Achievement: 08/26/17 Potential to Achieve Goals: Good    Frequency Min 3X/week   Barriers to discharge Inaccessible home environment;Decreased caregiver support Lives in split level home. Husband works mornings and evenings.    Co-evaluation PT/OT/SLP Co-Evaluation/Treatment: Yes Reason for Co-Treatment: For patient/therapist safety PT goals addressed during session: Mobility/safety with mobility;Balance OT goals addressed during session: ADL's and self-care       AM-PAC PT "6 Clicks" Daily Activity  Outcome Measure Difficulty turning over in bed (including adjusting bedclothes, sheets and blankets)?: Unable Difficulty moving from lying on back to sitting on the side of the bed? : Unable Difficulty sitting down on and standing up from a chair with arms (e.g., wheelchair, bedside commode, etc,.)?: Unable Help needed moving to and from a bed to chair (including a wheelchair)?: A Lot Help needed walking in hospital room?: A Lot Help needed climbing 3-5 steps with a railing? : Total 6 Click Score: 8    End of Session Equipment Utilized During Treatment: Gait belt Activity Tolerance: Treatment limited secondary to medical complications (Comment)(nausea/vomiting) Patient left: in chair;with call bell/phone within reach;with chair alarm set Nurse Communication: Mobility status(nausea/vomiting) PT Visit Diagnosis: Unsteadiness on feet (R26.81);Other abnormalities of gait and mobility (R26.89);Pain Pain - Right/Left:  Right Pain - part of body: Ankle and joints of foot    Time: 1201-1230 PT Time Calculation (min) (ACUTE ONLY): 29 min   Charges:   PT Evaluation $PT Eval Moderate Complexity: 1 Mod     PT G CodesMarland Kitchen        [facility] PT [place] 08/12/2017, 1:59 PM

## 2017-08-12 NOTE — Progress Notes (Signed)
Patient having nausea and vomiting, PRN Zofran given no relief,  Gwenette Greet PA paged verbal order received for Penetran 12.5mg  one time order. Instructions given to repeat dose if dose not effective, will continue to monitor.

## 2017-08-12 NOTE — Progress Notes (Signed)
Patient is diabetic has no ordered for blood sugar checks, Maurren  PA, notified, verbal order received for ACHS with moderate scale sliding scale insulin,Novolog, will continue to monitor.

## 2017-08-12 NOTE — Progress Notes (Signed)
Inpatient Rehabilitation  Per PT/OT request, patient was screened by [facility] for appropriateness for an Inpatient Acute Rehab consult.  At this time we are recommending an Inpatient Rehab consult.  Please order if you are agreeable.    Syriah Delisi, M.A., CCC/SLP Admission Coordinator  [facility]  Cell [phone]  

## 2017-08-12 NOTE — Plan of Care (Signed)
  Progressing Education: Knowledge of General Education information will improve 08/12/2017 1431 - Progressing by Renee Rival, RN Clinical Measurements: Ability to maintain clinical measurements within normal limits will improve 08/12/2017 1431 - Progressing by Renee Rival, RN Will remain free from infection 08/12/2017 1431 - Progressing by Renee Rival, RN Diagnostic test results will improve 08/12/2017 1431 - Progressing by Renee Rival, RN Respiratory complications will improve 08/12/2017 1431 - Progressing by Renee Rival, RN Cardiovascular complication will be avoided 08/12/2017 1431 - Progressing by Renee Rival, RN Activity: Risk for activity intolerance will decrease 08/12/2017 1431 - Progressing by Renee Rival, RN Pain Managment: General experience of comfort will improve 08/12/2017 1431 - Progressing by Renee Rival, RN Safety: Ability to remain free from injury will improve 08/12/2017 1431 - Progressing by Renee Rival, RN

## 2017-08-12 NOTE — Progress Notes (Signed)
Subjective: Interval History: none.. Reports some incisional soreness.  Otherwise comfortable  Objective: Vital signs in last 24 hours: Temp:  [97.6 F (36.4 C)-98 F (36.7 C)] 97.8 F (36.6 C) (01/17 0447) Pulse Rate:  [81-90] 86 (01/17 0447) Resp:  [10-24] 19 (01/17 0447) BP: (90-195)/(44-71) 112/50 (01/17 0447) SpO2:  [93 %-100 %] 100 % (01/17 0447) Weight:  [150 lb 9.2 oz (68.3 kg)-151 lb (68.5 kg)] 150 lb 9.2 oz (68.3 kg) (01/17 0447)  Intake/Output from previous day: 01/16 0701 - 01/17 0700 In: 1050 [I.V.:750; IV Piggyback:300] Out: 130 [Blood:130] Intake/Output this shift: No intake/output data recorded.  Palpable popliteal pulse.  Some blood on her toe amputation dressing.  Will change later today.  Lab Results: Recent Labs    08/11/17 0800 08/12/17 0220  WBC 9.4 15.1*  HGB 13.6 10.2*  HCT 42.3 32.4*  PLT 341 274   BMET Recent Labs    08/11/17 0933 08/12/17 0220  NA 138 135  K 3.6 6.2*  CL 104 97*  CO2 23 22  GLUCOSE 85 202*  BUN 20 38*  CREATININE 3.48* 5.46*  CALCIUM 7.7* 8.7*    Studies/Results: No results found. Anti-infectives: Anti-infectives (From admission, onward)   Start     Dose/Rate Route Frequency Ordered Stop   08/12/17 0000  cefUROXime (ZINACEF) 1.5 g in dextrose 5 % 50 mL IVPB     1.5 g 100 mL/hr over 30 Minutes Intravenous Every 12 hours 08/11/17 1836 08/12/17 2359   08/11/17 0802  cefUROXime (ZINACEF) 1.5 g in dextrose 5 % 50 mL IVPB     1.5 g 100 mL/hr over 30 Minutes Intravenous 30 min pre-op 08/11/17 0802 08/11/17 1330      Assessment/Plan: s/p Procedure(s): RIGHT FEMORAL TO BELOW KNEE POPLITEAL ARTERKY  BYPASS GRAFT USING 6MM RINGED PROPATEN GRAFT (Right) RIGHT GREAT TOE AMPUTATION DIGIT (Right) Stable postop day 1.  We will begin to mobilize.   LOS: 1 day   Todd Early 08/12/2017, 7:12 AM

## 2017-08-12 NOTE — Progress Notes (Signed)
    Left foot dressing changed due to bloody saturation s/p left GT amputation.  Suture line looks viable, skin edge bleeding laterally at incision site.  Compression with 4 x 4 placed, kerlex and ace.  Patient tolerated this well.  Ordered Darco shoe for left Heel weight bearing.  Roxy Horseman PA-C

## 2017-08-13 ENCOUNTER — Inpatient Hospital Stay (HOSPITAL_COMMUNITY): Payer: Medicare Other

## 2017-08-13 DIAGNOSIS — I739 Peripheral vascular disease, unspecified: Secondary | ICD-10-CM

## 2017-08-13 DIAGNOSIS — R5381 Other malaise: Secondary | ICD-10-CM

## 2017-08-13 LAB — CBC
HCT: 31.1 % — ABNORMAL LOW (ref 36.0–46.0)
Hemoglobin: 10 g/dL — ABNORMAL LOW (ref 12.0–15.0)
MCH: 31.4 pg (ref 26.0–34.0)
MCHC: 32.2 g/dL (ref 30.0–36.0)
MCV: 97.8 fL (ref 78.0–100.0)
Platelets: 255 10*3/uL (ref 150–400)
RBC: 3.18 MIL/uL — ABNORMAL LOW (ref 3.87–5.11)
RDW: 14.6 % (ref 11.5–15.5)
WBC: 14.9 10*3/uL — ABNORMAL HIGH (ref 4.0–10.5)

## 2017-08-13 LAB — RENAL FUNCTION PANEL
Albumin: 2.9 g/dL — ABNORMAL LOW (ref 3.5–5.0)
Anion gap: 16 — ABNORMAL HIGH (ref 5–15)
BUN: 24 mg/dL — ABNORMAL HIGH (ref 6–20)
CO2: 23 mmol/L (ref 22–32)
Calcium: 8.7 mg/dL — ABNORMAL LOW (ref 8.9–10.3)
Chloride: 95 mmol/L — ABNORMAL LOW (ref 101–111)
Creatinine, Ser: 4.19 mg/dL — ABNORMAL HIGH (ref 0.44–1.00)
GFR calc Af Amer: 12 mL/min — ABNORMAL LOW (ref >60)
GFR calc non Af Amer: 10 mL/min — ABNORMAL LOW (ref >60)
Glucose, Bld: 146 mg/dL — ABNORMAL HIGH (ref 65–99)
Phosphorus: 6.6 mg/dL — ABNORMAL HIGH (ref 2.5–4.6)
Potassium: 4.8 mmol/L (ref 3.5–5.1)
Sodium: 134 mmol/L — ABNORMAL LOW (ref 135–145)

## 2017-08-13 LAB — GLUCOSE, CAPILLARY
Glucose-Capillary: 162 mg/dL — ABNORMAL HIGH (ref 65–99)
Glucose-Capillary: 117 mg/dL — ABNORMAL HIGH (ref 65–99)
Glucose-Capillary: 86 mg/dL (ref 65–99)
Glucose-Capillary: 125 mg/dL — ABNORMAL HIGH (ref 65–99)

## 2017-08-13 MED ORDER — PROMETHAZINE HCL 25 MG/ML IJ SOLN
12.5000 mg | Freq: Once | INTRAMUSCULAR | Status: AC
  Administered 2017-08-13: 12.5 mg via INTRAVENOUS

## 2017-08-13 MED ORDER — MORPHINE SULFATE (PF) 4 MG/ML IV SOLN
INTRAVENOUS | Status: AC
  Administered 2017-08-13: 2 mg
  Filled 2017-08-13: qty 1

## 2017-08-13 MED ORDER — DOXERCALCIFEROL 4 MCG/2ML IV SOLN
INTRAVENOUS | Status: AC
  Administered 2017-08-13: 6 ug via INTRAVENOUS
  Filled 2017-08-13: qty 4

## 2017-08-13 MED ORDER — PROMETHAZINE HCL 25 MG/ML IJ SOLN
INTRAMUSCULAR | Status: AC
  Administered 2017-08-13: 12.5 mg via INTRAVENOUS
  Filled 2017-08-13: qty 1

## 2017-08-13 NOTE — Clinical Social Work Note (Signed)
Clinical Social Work Assessment  Patient Details  Name: Destiny Day MRN: [id number] Date of Birth: Apr 13, 1951  Date of referral:  08/13/17               Reason for consult:  Facility Placement, Discharge Planning                Permission sought to share information with:  Family Supports Permission granted to share information::  Yes, Verbal Permission Granted  Name::     Jacquel Redditt  Agency::     Relationship::  spouse  Contact Information:  [phone]  Housing/Transportation Living arrangements for the past 2 months:  [place] of Information:  Patient Patient Interpreter Needed:  None Criminal Activity/Legal Involvement Pertinent to Current Situation/Hospitalization:  No - Comment as needed Significant Relationships:  Adult Children, Spouse, Other Family Members Lives with:  Spouse Do you feel safe going back to the place where you live?  Yes Need for family participation in patient care:  Yes (Comment)  Care giving concerns:  No family at bedside. Patient stated she lives at home with spouse but spouse works during the day  Facilities manager / plan:  CSW met patient at bedside to discuss disposition plan. Patient stated she is agreeable to discharge to CIR. CSW asked if patient would be agreeable to discharge to SNF as a back up in case CIR is unable to take her. Patient stated she will not go to a SNF because she had a bad experience with a facility in the past. Patient was not agreeable to try other SNF in the area. CSW asked patient what would be a back up plan if CIR is unable to accept patient. Patient stated she will go home with home health to follow. CSW encouraged patient to communicate plan with family in case patient does dc home. CSW explained to patient how home health works and unfortunately  [facility] wont be able to give patient 24/hr care. CSW encouraged patient to come up with a plan with family. patient stated she will take care of it  and will have a back discharge plan if CIR is unable to take pt.   Employment status:  Retired Health visitor PT Recommendations:  Inpatient [place] / Referral to community resources:  [place]  Patient/Family's Response to care:  Patient is wanting to stay in the hospital to do rehab  Patient/Family's Understanding of and Emotional Response to Diagnosis, Current Treatment, and Prognosis:  Patient understand recent hospitalization and limitations   Emotional Assessment Appearance:  Appears stated age Attitude/Demeanor/Rapport:  Other Affect (typically observed):  Appropriate Orientation:  Oriented to Self, Oriented to Situation, Oriented to Place, Oriented to  Time Alcohol / Substance use:  Not Applicable Psych involvement (Current and /or in the community):     Discharge Needs  Concerns to be addressed:  Discharge Planning Concerns(pt will need a back up plan in case CIR cant take pt) Readmission within the last 30 days:  No Current discharge risk:  None Barriers to Discharge:  No Barriers Identified   Wende Neighbors, LCSW 08/13/2017, 2:02 PM

## 2017-08-13 NOTE — Progress Notes (Signed)
Subjective: Interval History: has no complaint.  Objective: Vital signs in last 24 hours: Temp:  [97.4 F (36.3 C)-98.1 F (36.7 C)] 97.6 F (36.4 C) (01/18 0800) Pulse Rate:  [82-101] 82 (01/18 0452) Resp:  [17-22] 17 (01/18 0452) BP: (83-151)/(44-83) 98/83 (01/18 0800) SpO2:  [100 %] 100 % (01/18 0452) Weight:  [66.3 kg (146 lb 2.6 oz)-67.2 kg (148 lb 2.4 oz)] 66.5 kg (146 lb 11.2 oz) (01/18 0452) Weight change: -1.293 kg (-13.6 oz)  Intake/Output from previous day: 01/17 0701 - 01/18 0700 In: 290 [P.O.:240; IV Piggyback:50] Out: 918  Intake/Output this shift: Total I/O In: 120 [P.O.:120] Out: -   General appearance: alert, cooperative and no distress Resp: diminished breath sounds bilaterally Cardio: S1, S2 normal and systolic murmur: systolic ejection 2/6, decrescendo at 2nd left intercostal space GI: soft, non-tender; bowel sounds normal; no masses,  no organomegaly Extremities: incision R leg medial leg warm, AVF LUA  Lab Results: Recent Labs    08/12/17 0220 08/13/17 0352  WBC 15.1* 14.9*  HGB 10.2* 10.0*  HCT 32.4* 31.1*  PLT 274 255   BMET:  Recent Labs    08/12/17 1505 08/13/17 0352  NA 131* 134*  K 5.8* 4.8  CL 94* 95*  CO2 20* 23  GLUCOSE 214* 146*  BUN 48* 24*  CREATININE 6.37* 4.19*  CALCIUM 8.8* 8.7*   No results for input(s): PTH in the last 72 hours. Iron Studies: No results for input(s): IRON, TIBC, TRANSFERRIN, FERRITIN in the last 72 hours.  Studies/Results: No results found.  I have reviewed the patient's current medications.  Assessment/Plan: 1 ESRD HD TTS  Vol ok. bp low 2 anemia esa 3 HPTH vit D 4 HTN lower meds 5 PVD per VVS 6 DM controlled 7 debill P HD, esa, lower bp meds, rehab    LOS: 2 days   [name] Arti Trang 08/13/2017,10:04 AM

## 2017-08-13 NOTE — Anesthesia Postprocedure Evaluation (Signed)
Anesthesia Post Note  Patient: Destiny Day  Procedure(s) Performed: RIGHT FEMORAL TO BELOW KNEE POPLITEAL ARTERKY  BYPASS GRAFT USING 6MM RINGED PROPATEN GRAFT (Right Leg Upper) RIGHT GREAT TOE AMPUTATION DIGIT (Right )     Patient location during evaluation: PACU Anesthesia Type: General Level of consciousness: awake and sedated Pain management: pain level controlled Vital Signs Assessment: post-procedure vital signs reviewed and stable Respiratory status: spontaneous breathing, nonlabored ventilation, respiratory function stable and patient connected to nasal cannula oxygen Cardiovascular status: blood pressure returned to baseline and stable Postop Assessment: no apparent nausea or vomiting Anesthetic complications: no    Last Vitals:  Vitals:   08/13/17 1133 08/13/17 1200  BP: 128/88   Pulse: 84 84  Resp:  14  Temp: 36.8 C   SpO2: 100% 100%    Last Pain:  Vitals:   08/13/17 1133  TempSrc: Oral  PainSc:                  Erva Koke,JAMES TERRILL

## 2017-08-13 NOTE — Progress Notes (Signed)
PT Cancellation Note  Patient Details Name: Destiny Day MRN: [id number] DOB: [birth year]   Cancelled Treatment:    Reason Eval/Treat Not Completed: (P) Other (comment);Patient at procedure or test/unavailable(Pt off unit for HD treatment, will continue efforts per POC.)   [name] Eli Hose 08/13/2017, 3:17 PM  Governor Rooks, PTA pager [phone]

## 2017-08-13 NOTE — Progress Notes (Signed)
I went to pt's bedside in hemodialysis, but she is just now settled down after having nausea. I will follow up Monday to discuss a possible inpt rehab admit pending bed availability when pt medically ready for d/c. [phone]

## 2017-08-13 NOTE — Progress Notes (Addendum)
[facility]   Subjective  - Doing OK, ambulated a little yesterday.   Objective (!) 115/50 82 98.1 F (36.7 C) (Oral) 17 100%  Intake/Output Summary (Last 24 hours) at 08/13/2017 0805 Last data filed at 08/12/2017 1830 Gross per 24 hour  Intake 170 ml  Output 918 ml  Net -748 ml    Doppler DP/peroneal Right GT amputation site without active skin edge bleeding.  Dressing changed Right groin healing well Right Leg incision healing well  Heart RRR Lungs nonlabored breathing on RA  Assessment/Planning: POD # 2 right Fem-pop bypass with propatent graft   CIR recommend by PT.  I will put in CIR consult.  Patient is addiment that she not go to a SNF new dry guaze plain in groin crease for protection from wound break down. Decreasing leukocytosis.  Stable HGB  Plavix daily HD was performed yesterday, she tolerated this well    Roxy Horseman 08/13/2017 8:05 AM --  Laboratory Lab Results: Recent Labs    08/12/17 0220 08/13/17 0352  WBC 15.1* 14.9*  HGB 10.2* 10.0*  HCT 32.4* 31.1*  PLT 274 255   BMET Recent Labs    08/12/17 1505 08/13/17 0352  NA 131* 134*  K 5.8* 4.8  CL 94* 95*  CO2 20* 23  GLUCOSE 214* 146*  BUN 48* 24*  CREATININE 6.37* 4.19*  CALCIUM 8.8* 8.7*    COAG Lab Results  Component Value Date   INR 1.05 08/11/2017   INR 1.03 07/07/2016   INR 1.02 10/06/2015   No results found for: PTT   I have examined the patient, reviewed and agree with above.  Comfortable.  Currently in hemodialysis.  We will continue to mobilize may be able to go home with home health and physical therapy versus inpatient rehab versus skilled nursing  Curt Jews, MD 08/13/2017 2:44 PM

## 2017-08-13 NOTE — Procedures (Signed)
I was present at this session.  I have reviewed the session itself and made appropriate changes.  HD via UA avg.  bp ok. Access press ok. tol well  Mauricia Area 1/18/20192:15 PM

## 2017-08-13 NOTE — Consult Note (Signed)
Physical Medicine and Rehabilitation Consult Reason for Consult: Decreased functional mobility Referring Physician: Dr. Donnetta Hutching   HPI: Destiny Day is a 67 y.o. right handed female with history of end-stage renal disease with hemodialysis, diastolic congestive heart failure, diabetes mellitus, hypertension,PVD. Per chart review patient lives with spouse. Independent with a cane prior to admission driving. Two-level home with bedroom upstairs. Spouse works during the day Presented 08/11/2017 with right foot ischemia and gangrenous changes of the right great toe. Underwent right femoral to below knee popliteal bypass as well as right great toe amputation 08/11/2017 per Dr. Donnetta Hutching. Weightbearing as tolerated through heel. Hospital course pain management. Hemodialysis ongoing as per renal services. Subcutaneous heparin for DVT prophylaxis. Acute blood loss anemia 10.0 and monitored. Physical therapy evaluation completed 08/12/2017 with recommendations of physical medicine rehabilitation consult.   Review of Systems  Constitutional: Negative for fever.  HENT: Negative for hearing loss.   Eyes: Negative for blurred vision and double vision.  Respiratory: Negative for cough.        Shortness of breath with exertion  Cardiovascular: Positive for leg swelling. Negative for chest pain and palpitations.  Gastrointestinal: Positive for constipation. Negative for nausea and vomiting.       GERD  Genitourinary: Negative for flank pain and hematuria.  Musculoskeletal: Positive for joint pain and myalgias.  Skin: Negative for rash.  Psychiatric/Behavioral:       Anxiety  All other systems reviewed and are negative.  Past Medical History:  Diagnosis Date  . Abdominal bruit   . Anemia   . Anxiety   . Arthritis    Osteoarthritis  . Asthma   . Cervical disc disease    "pinced nerve"  . CHF (congestive heart failure) ([place])   . Complication of anesthesia    " after I got home from my last  procedure, I started itching."  . Diabetes mellitus    Type II  . Diverticulitis   . ESRD on peritoneal dialysis [facility])    Hemodialysis - MWF- [place]   . GERD (gastroesophageal reflux disease)    from medications  . GI bleed 03/31/2013  . History of hiatal hernia   . Hyperlipidemia   . Hypertension   . Neuropathy    left leg  . Osteoporosis   . Peripheral vascular disease ([place])   . Pneumonia    "very young"  . Seasonal allergies   . Shortness of breath dyspnea    WIth exertion  . Sleep apnea    can't afford cpap   Past Surgical History:  Procedure Laterality Date  . A/V SHUNTOGRAM N/A 09/22/2016   Procedure: A/V Shuntogram - left arm;  Surgeon: Serafina Mitchell, MD;  Location: [place] [facility];  Service: Cardiovascular;  Laterality: N/A;  . ABDOMINAL HYSTERECTOMY  1993`  . AMPUTATION Left 09/01/2016   Procedure: LEFT FOOT TRANSMETATARSAL AMPUTATION;  Surgeon: Newt Minion, MD;  Location: [place];  Service: Orthopedics;  Laterality: Left;  . AMPUTATION Right 08/11/2017   Procedure: RIGHT GREAT TOE AMPUTATION DIGIT;  Surgeon: Rosetta Posner, MD;  Location: [facility] OR;  Service: Vascular;  Laterality: Right;  . AV FISTULA PLACEMENT Left 04/21/2016   Procedure: INSERTION OF ARTERIOVENOUS (AV) GORE-TEX GRAFT ARM LEFT;  Surgeon: Elam Dutch, MD;  Location: [place];  Service: Vascular;  Laterality: Left;  . [place] TRANSPOSITION Left 07/10/2014   Procedure: BASCILIC VEIN TRANSPOSITION;  Surgeon: Angelia Mould, MD;  Location: [place];  Service: Vascular;  Laterality: Left;  . BASCILIC VEIN TRANSPOSITION Right 11/08/2014   Procedure: FIRST STAGE BASILIC VEIN TRANSPOSITION;  Surgeon: Angelia Mould, MD;  Location: [place];  Service: Vascular;  Laterality: Right;  . BASCILIC VEIN TRANSPOSITION Right 01/18/2015   Procedure: SECOND STAGE BASILIC VEIN TRANSPOSITION;  Surgeon: Angelia Mould, MD;  Location: [facility] OR;  Service: Vascular;  Laterality: Right;  .  ESOPHAGOGASTRODUODENOSCOPY N/A 03/31/2013   Procedure: ESOPHAGOGASTRODUODENOSCOPY (EGD);  Surgeon: Gatha Mayer, MD;  Location: [facility] ENDOSCOPY;  Service: Endoscopy;  Laterality: N/A;  . EYE SURGERY     laser surgery  . FEMORAL-POPLITEAL BYPASS GRAFT Left 07/08/2016   Procedure: LEFT  FEMORAL-BELOW KNEE POPLITEAL ARTERY BYPASS GRAFT USING 6MM X 80 CM PROPATEN GORETEX GRAFT WITH RINGS.;  Surgeon: Rosetta Posner, MD;  Location: [place];  Service: Vascular;  Laterality: Left;  . FEMORAL-POPLITEAL BYPASS GRAFT Right 08/11/2017   Procedure: RIGHT FEMORAL TO BELOW KNEE POPLITEAL ARTERKY  BYPASS GRAFT USING 6MM RINGED PROPATEN GRAFT;  Surgeon: Rosetta Posner, MD;  Location: [place];  Service: Vascular;  Laterality: Right;  . FISTULOGRAM Left 10/29/2014   Procedure: FISTULOGRAM;  Surgeon: Angelia Mould, MD;  [facility] CATH LAB;  Service: Cardiovascular;  Laterality: Left;  . LIGATION OF ARTERIOVENOUS  FISTULA Left 04/21/2016   Procedure: LIGATION OF ARTERIOVENOUS  FISTULA LEFT ARM;  Surgeon: Elam Dutch, MD;  Location: [place];  Service: Vascular;  Laterality: Left;  . LOWER EXTREMITY ANGIOGRAPHY N/A 04/06/2017   Procedure: Lower Extremity Angiography - Right;  Surgeon: Serafina Mitchell, MD;  Location: [place] [facility];  Service: Cardiovascular;  Laterality: N/A;  . PATCH ANGIOPLASTY Right 01/18/2015   Procedure: BASILIC VEIN PATCH ANGIOPLASTY USING VASCUGUARD PATCH;  Surgeon: Angelia Mould, MD;  Location: [place];  Service: Vascular;  Laterality: Right;  . PERIPHERAL VASCULAR BALLOON ANGIOPLASTY  09/22/2016   Procedure: Peripheral Vascular Balloon Angioplasty;  Surgeon: Serafina Mitchell, MD;  Location: [place] [facility];  Service: Cardiovascular;;  Lt. Fistula  . PERIPHERAL VASCULAR CATHETERIZATION N/A 06/23/2016   Procedure: Abdominal Aortogram w/Lower Extremity;  Surgeon: Serafina Mitchell, MD;  Location: [place] [facility];  Service: Cardiovascular;  Laterality: N/A;  . PERIPHERAL VASCULAR  CATHETERIZATION  06/23/2016   Procedure: Peripheral Vascular Intervention;  Surgeon: Serafina Mitchell, MD;  Location: [place] [facility];  Service: Cardiovascular;;  lt common and external illiac artery   Family History  Problem Relation Age of Onset  . Other Mother        not sure of cause of death  . Diabetes Father   . Pancreatic cancer Maternal Grandmother   . Colon cancer Neg Hx    Social History:  reports that she quit smoking about 5 years ago. Her smoking use included cigarettes. She has a 14.00 pack-year smoking history. she has never used smokeless tobacco. She reports that she does not drink alcohol or use drugs. Allergies:  Allergies  Allergen Reactions  . Lisinopril Cough  . Prednisone Other (See Comments)    Excessive fluid buildup   Medications Prior to Admission  Medication Sig Dispense Refill  . acetaminophen (TYLENOL) 500 MG tablet Take 500 mg by mouth every 6 (six) hours as needed.    Marland Kitchen albuterol (PROVENTIL HFA;VENTOLIN HFA) 108 (90 BASE) MCG/ACT inhaler Inhale 1-2 puffs into the lungs every 6 (six) hours as needed for wheezing or shortness of breath.    Marland Kitchen amLODipine (NORVASC) 10 MG tablet Take 10 mg by mouth at bedtime.     Marland Kitchen  aspirin 81 MG chewable tablet Chew 81 mg by mouth every evening.     Marland Kitchen atorvastatin (LIPITOR) 20 MG tablet Take 20 mg by mouth at bedtime.     . budesonide-formoterol (SYMBICORT) 160-4.5 MCG/ACT inhaler Inhale 1 puff into the lungs daily as needed (for respiratory issues).     . calcium acetate (PHOSLO) 667 MG capsule Take 667-1,334 mg by mouth See admin instructions. 1 tablet twice daily with snacks. And 2 tablets 3 times daily with meals    . carvedilol (COREG) 12.5 MG tablet Take 12.5 mg by mouth 2 (two) times daily.     . cinacalcet (SENSIPAR) 30 MG tablet Take 30 mg by mouth daily with supper.     . clopidogrel (PLAVIX) 75 MG tablet TAKE 1 TABLET EVERY DAY 30 tablet 7  . diazepam (VALIUM) 5 MG tablet Take 5 mg by mouth every 12 (twelve)  hours as needed for anxiety.    . ferric citrate (AURYXIA) 1 GM 210 MG(Fe) tablet Take 420 mg by mouth 3 (three) times daily with meals.    . fluticasone (FLONASE) 50 MCG/ACT nasal spray Place 1 spray into both nostrils daily as needed for allergies or rhinitis.    Marland Kitchen gabapentin (NEURONTIN) 100 MG capsule Take 1 capsule (100 mg total) by mouth at bedtime. When necessary for neuropathy pain 30 capsule 3  . HYDROcodone-acetaminophen (NORCO) 5-325 MG tablet Take 1 tablet by mouth every 6 (six) hours as needed for moderate pain. 30 tablet 0  . insulin lispro (HUMALOG) 100 UNIT/ML injection Inject 0-8 Units into the skin 3 (three) times daily before meals. 0-59 = hypoglycemic protocol, 60-149 = 0 units, 150-250 = 5 units, 251-300 = 8 units, >301 notify MD/NP    . isosorbide dinitrate (ISORDIL) 20 MG tablet Take 1 tablet by mouth See admin instructions. Take 1 tablet daily on dialysis days and 1 tab twice daily on non dialysis days    . LEVEMIR FLEXTOUCH 100 UNIT/ML Pen Inject 10 Units into the skin at bedtime.     . multivitamin (RENA-VIT) TABS tablet Take 1 tablet by mouth daily.   5  . omeprazole (PRILOSEC) 20 MG capsule Take 20 mg by mouth daily.     . sucroferric oxyhydroxide (VELPHORO) 500 MG chewable tablet Chew 500-1,000 mg by mouth See admin instructions. Take 2 tablets (1000 mg) by mouth with meals and 1 tablet (500 mg) with snacks    . [DISCONTINUED] doxycycline (VIBRA-TABS) 100 MG tablet Take 1 tablet (100 mg total) by mouth 2 (two) times daily. 60 tablet 0  . hydrALAZINE (APRESOLINE) 25 MG tablet Take 1 tablet by mouth See admin instructions. Take 1 tab 3 times a day on dialysis days and 1 tab twice daily on non dialysis days    . loratadine (CLARITIN) 10 MG tablet Take 10 mg by mouth daily as needed for allergies.      Home: Home Living Family/patient expects to be discharged to:: Inpatient rehab Living Arrangements: Spouse/significant other Available Help at Discharge: Family, Available  24 hours/day Type of Home: House Home Access: Stairs to enter [employer] of Steps: 2 Entrance Stairs-Rails: Left Home Layout: Multi-level, Bed/bath upstairs Alternate Level Stairs-Number of Steps: 6 and 6 split level Alternate Level Stairs-Rails: Left Bathroom Shower/Tub: Tub/shower unit, Architectural technologist: Standard Bathroom Accessibility: No Home Equipment: Environmental consultant - 4 wheels, Cane - single point, Shower seat, Crutches  Functional History: Prior Function Level of Independence: Independent with assistive device(s) Comments: walked with cane Functional Status:  Mobility:  Bed Mobility Overal bed mobility: Needs Assistance Bed Mobility: Supine to Sit Supine to sit: Mod assist, HOB elevated General bed mobility comments: assist for R LE to EOB, to raise trunk and position hips at EOB Transfers Overall transfer level: Needs assistance Equipment used: Rolling walker (2 wheeled) Transfers: Sit to/from Stand, [employer] Transfers Sit to Stand: Mod assist Stand pivot transfers: Min assist General transfer comment: assist to rise and gain balance, cues for hand and L foot placement. Bed to chair with small pivotal steps with walker. Unsteady requiring assist for balance. Ambulation/Gait General Gait Details: Not attempted more than bed to chair due to nausea and vomiting.    ADL: ADL Overall ADL's : Needs assistance/impaired Eating/Feeding: Independent, Sitting Grooming: Wash/dry face, Sitting, Set up Upper Body Bathing: Supervision/ safety, Sitting Lower Body Bathing: Maximal assistance, Sit to/from stand Upper Body Dressing : Set up, Sitting Lower Body Dressing: Total assistance, Sit to/from stand Toilet Transfer: Minimal assistance, Stand-pivot, RW Toilet Transfer Details (indicate cue type and reason): simulated to chair Toileting- Clothing Manipulation and Hygiene: Minimal assistance, Sit to/from stand General ADL Comments: Pt vomited upon reaching EOB, RN  aware and gave meds  Cognition: Cognition Overall Cognitive Status: Within Functional Limits for tasks assessed Orientation Level: Oriented X4 Cognition Arousal/Alertness: Awake/alert Behavior During Therapy: WFL for tasks assessed/performed Overall Cognitive Status: Within Functional Limits for tasks assessed  Blood pressure (!) 115/50, pulse 82, temperature 98.1 F (36.7 C), temperature source Oral, resp. rate 17, height 5' 4.5" (1.638 m), weight 66.5 kg (146 lb 11.2 oz), SpO2 100 %. Physical Exam  Vitals reviewed. Constitutional: She is oriented to person, place, and time. She appears well-developed.  HENT:  Head: Normocephalic.  Eyes: EOM are normal.  Neck: Normal range of motion. Neck supple. No thyromegaly present.  Cardiovascular: Normal rate and regular rhythm.  Respiratory: Effort normal and breath sounds normal. No respiratory distress.  GI: Soft. Bowel sounds are normal. She exhibits no distension.  Musculoskeletal:  Left TMA  Neurological: She is alert and oriented to person, place, and time.  UE 4/5 bilaterally. LE limited by pain  Skin:  Right lower extremity is dressed appropriately tender    Results for orders placed or performed during the hospital encounter of 08/11/17 (from the past 24 hour(s))  Glucose, capillary     Status: Abnormal   Collection Time: 08/12/17 11:29 AM  Result Value Ref Range   Glucose-Capillary 229 (H) 65 - 99 mg/dL   Comment 1 Notify RN    Comment 2 Document in Chart   Renal function panel     Status: Abnormal   Collection Time: 08/12/17  3:05 PM  Result Value Ref Range   Sodium 131 (L) 135 - 145 mmol/L   Potassium 5.8 (H) 3.5 - 5.1 mmol/L   Chloride 94 (L) 101 - 111 mmol/L   CO2 20 (L) 22 - 32 mmol/L   Glucose, Bld 214 (H) 65 - 99 mg/dL   BUN 48 (H) 6 - 20 mg/dL   Creatinine, Ser 6.37 (H) 0.44 - 1.00 mg/dL   Calcium 8.8 (L) 8.9 - 10.3 mg/dL   Phosphorus 7.1 (H) 2.5 - 4.6 mg/dL   Albumin 3.2 (L) 3.5 - 5.0 g/dL   GFR calc non  Af Amer 6 (L) >60 mL/min   GFR calc Af Amer 7 (L) >60 mL/min   Anion gap 17 (H) 5 - 15  Glucose, capillary     Status: Abnormal   Collection Time: 08/12/17  6:44 PM  Result Value Ref Range   Glucose-Capillary 118 (H) 65 - 99 mg/dL  Glucose, capillary     Status: Abnormal   Collection Time: 08/12/17  8:28 PM  Result Value Ref Range   Glucose-Capillary 137 (H) 65 - 99 mg/dL  Renal function panel     Status: Abnormal   Collection Time: 08/13/17  3:52 AM  Result Value Ref Range   Sodium 134 (L) 135 - 145 mmol/L   Potassium 4.8 3.5 - 5.1 mmol/L   Chloride 95 (L) 101 - 111 mmol/L   CO2 23 22 - 32 mmol/L   Glucose, Bld 146 (H) 65 - 99 mg/dL   BUN 24 (H) 6 - 20 mg/dL   Creatinine, Ser 4.19 (H) 0.44 - 1.00 mg/dL   Calcium 8.7 (L) 8.9 - 10.3 mg/dL   Phosphorus 6.6 (H) 2.5 - 4.6 mg/dL   Albumin 2.9 (L) 3.5 - 5.0 g/dL   GFR calc non Af Amer 10 (L) >60 mL/min   GFR calc Af Amer 12 (L) >60 mL/min   Anion gap 16 (H) 5 - 15  CBC     Status: Abnormal   Collection Time: 08/13/17  3:52 AM  Result Value Ref Range   WBC 14.9 (H) 4.0 - 10.5 K/uL   RBC 3.18 (L) 3.87 - 5.11 MIL/uL   Hemoglobin 10.0 (L) 12.0 - 15.0 g/dL   HCT 31.1 (L) 36.0 - 46.0 %   MCV 97.8 78.0 - 100.0 fL   MCH 31.4 26.0 - 34.0 pg   MCHC 32.2 30.0 - 36.0 g/dL   RDW 14.6 11.5 - 15.5 %   Platelets 255 150 - 400 K/uL  Glucose, capillary     Status: Abnormal   Collection Time: 08/13/17  6:08 AM  Result Value Ref Range   Glucose-Capillary 162 (H) 65 - 99 mg/dL   No results found.  Assessment/Plan: Diagnosis: Functional deficits secondary to right lower extremity bypass graft and right great toe amputation 1. Does the need for close, 24 hr/day medical supervision in concert with the patient's rehab needs make it unreasonable for this patient to be served in a less intensive setting? Yes 2. Co-Morbidities requiring supervision/potential complications: dm2, hypokalemia, end-stage renal disease on hemodialysis, chronic diastolic  heart failure, history of left transmetatarsal amputation, hypertension 3. Due to bladder management, bowel management, safety, skin/wound care, disease management, medication administration, pain management and patient education, does the patient require 24 hr/day rehab nursing? Yes 4. Does the patient require coordinated care of a physician, rehab nurse, PT (1-2 hrs/day, 5 days/week) and OT (1-2 hrs/day, 5 days/week) to address physical and functional deficits in the context of the above medical diagnosis(es)? Yes Addressing deficits in the following areas: balance, endurance, locomotion, strength, transferring, bowel/bladder control, bathing, dressing, feeding, grooming, toileting and psychosocial support 5. Can the patient actively participate in an intensive therapy program of at least 3 hrs of therapy per day at least 5 days per week? Yes 6. The potential for patient to make measurable gains while on inpatient rehab is good 7. Anticipated functional outcomes upon discharge from inpatient rehab are supervision and min assist  with PT, supervision and min assist with OT, n/a with SLP. 8. Estimated rehab length of stay to reach the above functional goals is: 10-13 days 9. Anticipated D/C setting: Home 10. Anticipated post D/C treatments: HH therapy and Outpatient therapy 11. Overall Rehab/Functional Prognosis: good  RECOMMENDATIONS: This patient's condition is appropriate for continued rehabilitative care in the following setting: CIR Patient has  agreed to participate in recommended program. Yes Note that insurance prior authorization may be required for reimbursement for recommended care.  Comment: Rehab Admissions Coordinator to follow up.  Thanks,  Meredith Staggers, MD, Mellody Drown    Lavon Paganini Angiulli, PA-C 08/13/2017

## 2017-08-13 NOTE — Progress Notes (Addendum)
Bilateral ABIs completed. Actually A/Forearm/ I s due to the fact that the patient states that the right upper arm cannot be used and the left arm is restricted due to dialysis access, Rt 0.75 Lt 0.83. [facility], [place] 08/13/2017 11:28 AM

## 2017-08-14 LAB — RENAL FUNCTION PANEL
Albumin: 3 g/dL — ABNORMAL LOW (ref 3.5–5.0)
Anion gap: 14 (ref 5–15)
BUN: 17 mg/dL (ref 6–20)
CO2: 25 mmol/L (ref 22–32)
Calcium: 8.3 mg/dL — ABNORMAL LOW (ref 8.9–10.3)
Chloride: 93 mmol/L — ABNORMAL LOW (ref 101–111)
Creatinine, Ser: 3.24 mg/dL — ABNORMAL HIGH (ref 0.44–1.00)
GFR calc Af Amer: 16 mL/min — ABNORMAL LOW (ref >60)
GFR calc non Af Amer: 14 mL/min — ABNORMAL LOW (ref >60)
Glucose, Bld: 104 mg/dL — ABNORMAL HIGH (ref 65–99)
Phosphorus: 4.7 mg/dL — ABNORMAL HIGH (ref 2.5–4.6)
Potassium: 4 mmol/L (ref 3.5–5.1)
Sodium: 132 mmol/L — ABNORMAL LOW (ref 135–145)

## 2017-08-14 LAB — CBC
HCT: 27.8 % — ABNORMAL LOW (ref 36.0–46.0)
Hemoglobin: 9.1 g/dL — ABNORMAL LOW (ref 12.0–15.0)
MCH: 32 pg (ref 26.0–34.0)
MCHC: 32.7 g/dL (ref 30.0–36.0)
MCV: 97.9 fL (ref 78.0–100.0)
Platelets: 296 10*3/uL (ref 150–400)
RBC: 2.84 MIL/uL — ABNORMAL LOW (ref 3.87–5.11)
RDW: 14.6 % (ref 11.5–15.5)
WBC: 14.9 10*3/uL — ABNORMAL HIGH (ref 4.0–10.5)

## 2017-08-14 LAB — GLUCOSE, CAPILLARY
Glucose-Capillary: 113 mg/dL — ABNORMAL HIGH (ref 65–99)
Glucose-Capillary: 104 mg/dL — ABNORMAL HIGH (ref 65–99)
Glucose-Capillary: 185 mg/dL — ABNORMAL HIGH (ref 65–99)
Glucose-Capillary: 126 mg/dL — ABNORMAL HIGH (ref 65–99)

## 2017-08-14 MED ORDER — PROMETHAZINE HCL 25 MG/ML IJ SOLN
12.5000 mg | Freq: Four times a day (QID) | INTRAMUSCULAR | Status: DC | PRN
  Administered 2017-08-15: 12.5 mg via INTRAVENOUS
  Filled 2017-08-14: qty 1

## 2017-08-14 NOTE — Progress Notes (Signed)
    Subjective  - POD #3, s/p right fem pop with PTFE  no complaints   Physical Exam:  Doppler pedal signals Incision intact Dressing dry       Assessment/Plan:  POD #3  Continue to mobilize with plans for SNF vs rehab HD per renal  Wells Brabham 08/14/2017 11:38 AM --  Vitals:   08/14/17 0915 08/14/17 1107  BP: (!) 160/38   Pulse: 95   Resp: 12   Temp: 98.2 F (36.8 C)   SpO2: 100% 94%    Intake/Output Summary (Last 24 hours) at 08/14/2017 1138 Last data filed at 08/14/2017 0915 Gross per 24 hour  Intake 240 ml  Output 51 ml  Net 189 ml     Laboratory CBC    Component Value Date/Time   WBC 14.9 (H) 08/13/2017 0352   HGB 10.0 (L) 08/13/2017 0352   HCT 31.1 (L) 08/13/2017 0352   PLT 255 08/13/2017 0352    BMET    Component Value Date/Time   NA 134 (L) 08/13/2017 0352   NA 135 (A) 07/15/2016   K 4.8 08/13/2017 0352   CL 95 (L) 08/13/2017 0352   CO2 23 08/13/2017 0352   GLUCOSE 146 (H) 08/13/2017 0352   BUN 24 (H) 08/13/2017 0352   BUN 30 (A) 07/15/2016   CREATININE 4.19 (H) 08/13/2017 0352   CALCIUM 8.7 (L) 08/13/2017 0352   GFRNONAA 10 (L) 08/13/2017 0352   GFRAA 12 (L) 08/13/2017 0352    COAG Lab Results  Component Value Date   INR 1.05 08/11/2017   INR 1.03 07/07/2016   INR 1.02 10/06/2015   No results found for: PTT  Antibiotics Anti-infectives (From admission, onward)   Start     Dose/Rate Route Frequency Ordered Stop   08/12/17 0000  cefUROXime (ZINACEF) 1.5 g in dextrose 5 % 50 mL IVPB     1.5 g 100 mL/hr over 30 Minutes Intravenous Every 12 hours 08/11/17 1836 08/12/17 1231   08/11/17 0802  cefUROXime (ZINACEF) 1.5 g in dextrose 5 % 50 mL IVPB     1.5 g 100 mL/hr over 30 Minutes Intravenous 30 min pre-op 08/11/17 0802 08/11/17 1330       V. Leia Alf, M.D. [facility] Office: [phone] Pager:  [phone]

## 2017-08-14 NOTE — Progress Notes (Signed)
Subjective: Interval History: has complaints , N,V.  Objective: Vital signs in last 24 hours: Temp:  [98.3 F (36.8 C)-98.8 F (37.1 C)] 98.4 F (36.9 C) (01/19 0718) Pulse Rate:  [62-126] 126 (01/19 0735) Resp:  [12-20] 12 (01/19 0735) BP: (116-183)/(40-88) 183/75 (01/19 0730) SpO2:  [92 %-100 %] 100 % (01/19 0735) Weight:  [66 kg (145 lb 8.1 oz)-67.7 kg (149 lb 4 oz)] 67.7 kg (149 lb 4 oz) (01/19 0718) Weight change: 0.2 kg (7.1 oz)  Intake/Output from previous day: 01/18 0701 - 01/19 0700 In: 360 [P.O.:360] Out: 216  Intake/Output this shift: No intake/output data recorded.  General appearance: cooperative and mild distress Resp: clear to auscultation bilaterally Cardio: S1, S2 normal and systolic murmur: systolic ejection 2/6, decrescendo at 2nd left intercostal space GI: soft, non-tender; bowel sounds normal; no masses,  no organomegaly Extremities: incision R calf, dressing R foot,  AVF LUA  Lab Results: Recent Labs    08/12/17 0220 08/13/17 0352  WBC 15.1* 14.9*  HGB 10.2* 10.0*  HCT 32.4* 31.1*  PLT 274 255   BMET:  Recent Labs    08/12/17 1505 08/13/17 0352  NA 131* 134*  K 5.8* 4.8  CL 94* 95*  CO2 20* 23  GLUCOSE 214* 146*  BUN 48* 24*  CREATININE 6.37* 4.19*  CALCIUM 8.8* 8.7*   No results for input(s): PTH in the last 72 hours. Iron Studies: No results for input(s): IRON, TIBC, TRANSFERRIN, FERRITIN in the last 72 hours.  Studies/Results: No results found.  I have reviewed the patient's current medications.  Assessment/Plan: 1 ESRD on HD 2 PVD post op FP and toe amp 3 DM controlled 4 HTN see how is after HD 5 Anemia esa 6 HPTH vit D P control N, HD, esa. Wound care.    LOS: 3 days   [name] Bethenny Losee 08/14/2017,8:05 AM

## 2017-08-14 NOTE — Plan of Care (Signed)
  Progressing Clinical Measurements: Ability to maintain clinical measurements within normal limits will improve 08/14/2017 2242 - Progressing by Blair Promise, RN Will remain free from infection 08/14/2017 2242 - Progressing by Blair Promise, RN Diagnostic test results will improve 08/14/2017 2242 - Progressing by Blair Promise, RN Respiratory complications will improve 08/14/2017 2242 - Progressing by Blair Promise, RN Cardiovascular complication will be avoided 08/14/2017 2242 - Progressing by Blair Promise, RN Activity: Risk for activity intolerance will decrease 08/14/2017 2242 - Progressing by Blair Promise, RN Pain Managment: General experience of comfort will improve 08/14/2017 2242 - Progressing by Blair Promise, RN Education: Knowledge of the prescribed therapeutic regimen will improve 08/14/2017 2242 - Progressing by Blair Promise, RN Ability to verbalize activity precautions or restrictions will improve 08/14/2017 2242 - Progressing by Blair Promise, RN Understanding of discharge needs will improve 08/14/2017 2242 - Progressing by Blair Promise, RN Activity: Ability to perform//tolerate increased activity and mobilize with assistive devices will improve 08/14/2017 2242 - Progressing by Blair Promise, RN Self-Care: Ability to meet self-care needs will improve 08/14/2017 2242 - Progressing by Blair Promise, RN Skin Integrity: Demonstration of wound healing without infection will improve 08/14/2017 2242 - Progressing by Blair Promise, RN Education: Knowledge of prescribed regimen will improve 08/14/2017 2242 - Progressing by Blair Promise, RN Activity: Ability to tolerate increased activity will improve 08/14/2017 2242 - Progressing by Blair Promise, RN Bowel/Gastric: Gastrointestinal status for postoperative course will improve 08/14/2017 2242 -  Progressing by Blair Promise, RN Clinical Measurements: Postoperative complications will be avoided or minimized 08/14/2017 2242 - Progressing by Blair Promise, RN Signs and symptoms of graft occlusion will improve 08/14/2017 2242 - Progressing by Blair Promise, RN Skin Integrity: Demonstration of wound healing without infection will improve 08/14/2017 2242 - Progressing by Blair Promise, RN Metabolic: Ability to maintain appropriate glucose levels will improve 08/14/2017 2242 - Progressing by Blair Promise, RN

## 2017-08-14 NOTE — Procedures (Signed)
I was present at this session.  I have reviewed the session itself and made appropriate changes. bp low, N, keep even.  Access press ok.  Destiny Day 1/19/20198:04 AM

## 2017-08-15 LAB — GLUCOSE, CAPILLARY
Glucose-Capillary: 125 mg/dL — ABNORMAL HIGH (ref 65–99)
Glucose-Capillary: 151 mg/dL — ABNORMAL HIGH (ref 65–99)
Glucose-Capillary: 142 mg/dL — ABNORMAL HIGH (ref 65–99)
Glucose-Capillary: 204 mg/dL — ABNORMAL HIGH (ref 65–99)

## 2017-08-15 MED ORDER — DARBEPOETIN ALFA 150 MCG/0.3ML IJ SOSY
150.0000 ug | PREFILLED_SYRINGE | INTRAMUSCULAR | Status: DC
  Administered 2017-08-16: 150 ug via INTRAVENOUS
  Filled 2017-08-15: qty 0.3

## 2017-08-15 NOTE — Progress Notes (Addendum)
  Progress Note    08/15/2017 8:37 AM 4 Days Post-Op  Subjective:  No new complaints; nausea improved   Vitals:   08/15/17 0432 08/15/17 0745  BP:  (!) 127/45  Pulse: (!) 119 87  Resp: 16 18  Temp:  98.4 F (36.9 C)  SpO2: 95% 97%   Physical Exam: Lungs:  Non labored Incisions:  R leg incisions clean, dry, intact; R GT amp site healing well without breakdown or sign of infection Extremities:  Multiphasic R DP, PT by doppler Abdomen:  Soft Neurologic: A&O  CBC    Component Value Date/Time   WBC 14.9 (H) 08/14/2017 1046   RBC 2.84 (L) 08/14/2017 1046   HGB 9.1 (L) 08/14/2017 1046   HCT 27.8 (L) 08/14/2017 1046   PLT 296 08/14/2017 1046   MCV 97.9 08/14/2017 1046   MCH 32.0 08/14/2017 1046   MCHC 32.7 08/14/2017 1046   RDW 14.6 08/14/2017 1046   LYMPHSABS 2.5 10/12/2015 1600   MONOABS 0.9 10/12/2015 1600   EOSABS 0.2 10/12/2015 1600   BASOSABS 0.0 10/12/2015 1600    BMET    Component Value Date/Time   NA 132 (L) 08/14/2017 0749   NA 135 (A) 07/15/2016   K 4.0 08/14/2017 0749   CL 93 (L) 08/14/2017 0749   CO2 25 08/14/2017 0749   GLUCOSE 104 (H) 08/14/2017 0749   BUN 17 08/14/2017 0749   BUN 30 (A) 07/15/2016   CREATININE 3.24 (H) 08/14/2017 0749   CALCIUM 8.3 (L) 08/14/2017 0749   GFRNONAA 14 (L) 08/14/2017 0749   GFRAA 16 (L) 08/14/2017 0749    INR    Component Value Date/Time   INR 1.05 08/11/2017 0909     Intake/Output Summary (Last 24 hours) at 08/15/2017 0837 Last data filed at 08/14/2017 1835 Gross per 24 hour  Intake 240 ml  Output -165 ml  Net 405 ml     Assessment/Plan:  67 y.o. female is s/p R fem pop bypass with PTFE 4 Days Post-Op   R GT amp site healing well Continue plavix Encouraged OOB Possible d/c to inpt rehab tomorrow   Dagoberto Ligas, PA-C [facility] [phone] 08/15/2017 8:37 AM  I agree with the above.  Dressing changed to toe amp which is healing well.  Excellent pedal doppler signals.   Incisions ok.  Possibly to rehab tomorrow.  Annamarie Major

## 2017-08-15 NOTE — Progress Notes (Signed)
Subjective: Interval History: has no complaint .  Objective: Vital signs in last 24 hours: Temp:  [97.5 F (36.4 C)-98.8 F (37.1 C)] 98.4 F (36.9 C) (01/20 0745) Pulse Rate:  [84-119] 88 (01/20 0914) Resp:  [13-19] 16 (01/20 0914) BP: (126-143)/(45-61) 127/45 (01/20 0745) SpO2:  [92 %-100 %] 97 % (01/20 0914) Weight:  [71.4 kg (157 lb 6.5 oz)] 71.4 kg (157 lb 6.5 oz) (01/20 0432) Weight change: 0.3 kg (10.6 oz)  Intake/Output from previous day: 01/19 0701 - 01/20 0700 In: 240 [P.O.:240] Out: -165  Intake/Output this shift: No intake/output data recorded.  General appearance: alert, cooperative and no distress Resp: clear to auscultation bilaterally Cardio: S1, S2 normal and systolic murmur: systolic ejection 2/6, decrescendo at 2nd left intercostal space GI: soft, non-tender; bowel sounds normal; no masses,  no organomegaly Extremities: avf ok  Lab Results: Recent Labs    08/13/17 0352 08/14/17 1046  WBC 14.9* 14.9*  HGB 10.0* 9.1*  HCT 31.1* 27.8*  PLT 255 296   BMET:  Recent Labs    08/13/17 0352 08/14/17 0749  NA 134* 132*  K 4.8 4.0  CL 95* 93*  CO2 23 25  GLUCOSE 146* 104*  BUN 24* 17  CREATININE 4.19* 3.24*  CALCIUM 8.7* 8.3*   No results for input(s): PTH in the last 72 hours. Iron Studies: No results for input(s): IRON, TIBC, TRANSFERRIN, FERRITIN in the last 72 hours.  Studies/Results: No results found.  I have reviewed the patient's current medications.  Assessment/Plan: 1 ESRD for HD in am .  Vol  Ok 2 Anemia esa 3 HPTH vit D  4 DM controlled 5 PVD per [facility] P Rehab, HD, esa, vit D   LOS: 4 days   [name] Chele Cornell 08/15/2017,10:24 AM

## 2017-08-16 ENCOUNTER — Other Ambulatory Visit: Payer: Self-pay

## 2017-08-16 ENCOUNTER — Telehealth: Payer: Self-pay | Admitting: Vascular Surgery

## 2017-08-16 ENCOUNTER — Encounter (HOSPITAL_COMMUNITY): Payer: Self-pay | Admitting: *Deleted

## 2017-08-16 ENCOUNTER — Inpatient Hospital Stay (HOSPITAL_COMMUNITY)
Admission: RE | Admit: 2017-08-16 | Discharge: 2017-08-23 | DRG: 947 | Disposition: A | Discharge status: Other Acute Inpatient Hospital | Payer: Medicare Other | Source: Intra-hospital | Attending: Physical Medicine & Rehabilitation | Admitting: Physical Medicine & Rehabilitation

## 2017-08-16 DIAGNOSIS — E1151 Type 2 diabetes mellitus with diabetic peripheral angiopathy without gangrene: Secondary | ICD-10-CM | POA: Diagnosis present

## 2017-08-16 DIAGNOSIS — I132 Hypertensive heart and chronic kidney disease with heart failure and with stage 5 chronic kidney disease, or end stage renal disease: Secondary | ICD-10-CM | POA: Diagnosis present

## 2017-08-16 DIAGNOSIS — I5032 Chronic diastolic (congestive) heart failure: Secondary | ICD-10-CM | POA: Diagnosis present

## 2017-08-16 DIAGNOSIS — S7011XA Contusion of right thigh, initial encounter: Secondary | ICD-10-CM | POA: Diagnosis present

## 2017-08-16 DIAGNOSIS — S98132A Complete traumatic amputation of one left lesser toe, initial encounter: Secondary | ICD-10-CM

## 2017-08-16 DIAGNOSIS — G4730 Sleep apnea, unspecified: Secondary | ICD-10-CM | POA: Diagnosis present

## 2017-08-16 DIAGNOSIS — Z888 Allergy status to other drugs, medicaments and biological substances status: Secondary | ICD-10-CM

## 2017-08-16 DIAGNOSIS — S065X9A Traumatic subdural hemorrhage with loss of consciousness of unspecified duration, initial encounter: Secondary | ICD-10-CM | POA: Diagnosis not present

## 2017-08-16 DIAGNOSIS — Z992 Dependence on renal dialysis: Secondary | ICD-10-CM

## 2017-08-16 DIAGNOSIS — D62 Acute posthemorrhagic anemia: Secondary | ICD-10-CM | POA: Diagnosis present

## 2017-08-16 DIAGNOSIS — M810 Age-related osteoporosis without current pathological fracture: Secondary | ICD-10-CM | POA: Diagnosis present

## 2017-08-16 DIAGNOSIS — E1140 Type 2 diabetes mellitus with diabetic neuropathy, unspecified: Secondary | ICD-10-CM | POA: Diagnosis present

## 2017-08-16 DIAGNOSIS — Z7902 Long term (current) use of antithrombotics/antiplatelets: Secondary | ICD-10-CM

## 2017-08-16 DIAGNOSIS — G935 Compression of brain: Secondary | ICD-10-CM | POA: Diagnosis not present

## 2017-08-16 DIAGNOSIS — R5381 Other malaise: Secondary | ICD-10-CM | POA: Diagnosis not present

## 2017-08-16 DIAGNOSIS — J45909 Unspecified asthma, uncomplicated: Secondary | ICD-10-CM

## 2017-08-16 DIAGNOSIS — W1830XA Fall on same level, unspecified, initial encounter: Secondary | ICD-10-CM | POA: Diagnosis not present

## 2017-08-16 DIAGNOSIS — N186 End stage renal disease: Secondary | ICD-10-CM | POA: Diagnosis present

## 2017-08-16 DIAGNOSIS — S065XAA Traumatic subdural hemorrhage with loss of consciousness status unknown, initial encounter: Secondary | ICD-10-CM

## 2017-08-16 DIAGNOSIS — K219 Gastro-esophageal reflux disease without esophagitis: Secondary | ICD-10-CM | POA: Diagnosis present

## 2017-08-16 DIAGNOSIS — E119 Type 2 diabetes mellitus without complications: Secondary | ICD-10-CM | POA: Diagnosis not present

## 2017-08-16 DIAGNOSIS — E785 Hyperlipidemia, unspecified: Secondary | ICD-10-CM | POA: Diagnosis present

## 2017-08-16 DIAGNOSIS — Z90710 Acquired absence of both cervix and uterus: Secondary | ICD-10-CM

## 2017-08-16 DIAGNOSIS — E1122 Type 2 diabetes mellitus with diabetic chronic kidney disease: Secondary | ICD-10-CM | POA: Diagnosis present

## 2017-08-16 DIAGNOSIS — E8889 Other specified metabolic disorders: Secondary | ICD-10-CM | POA: Diagnosis present

## 2017-08-16 DIAGNOSIS — F411 Generalized anxiety disorder: Secondary | ICD-10-CM

## 2017-08-16 DIAGNOSIS — Z794 Long term (current) use of insulin: Secondary | ICD-10-CM

## 2017-08-16 DIAGNOSIS — S0181XA Laceration without foreign body of other part of head, initial encounter: Secondary | ICD-10-CM | POA: Diagnosis not present

## 2017-08-16 DIAGNOSIS — E1159 Type 2 diabetes mellitus with other circulatory complications: Secondary | ICD-10-CM | POA: Diagnosis not present

## 2017-08-16 DIAGNOSIS — I120 Hypertensive chronic kidney disease with stage 5 chronic kidney disease or end stage renal disease: Secondary | ICD-10-CM | POA: Diagnosis not present

## 2017-08-16 DIAGNOSIS — S98111A Complete traumatic amputation of right great toe, initial encounter: Secondary | ICD-10-CM

## 2017-08-16 DIAGNOSIS — Z79899 Other long term (current) drug therapy: Secondary | ICD-10-CM

## 2017-08-16 DIAGNOSIS — J302 Other seasonal allergic rhinitis: Secondary | ICD-10-CM | POA: Diagnosis present

## 2017-08-16 DIAGNOSIS — W19XXXA Unspecified fall, initial encounter: Secondary | ICD-10-CM

## 2017-08-16 DIAGNOSIS — Z7982 Long term (current) use of aspirin: Secondary | ICD-10-CM

## 2017-08-16 DIAGNOSIS — I10 Essential (primary) hypertension: Secondary | ICD-10-CM

## 2017-08-16 DIAGNOSIS — Z89422 Acquired absence of other left toe(s): Secondary | ICD-10-CM

## 2017-08-16 DIAGNOSIS — I739 Peripheral vascular disease, unspecified: Secondary | ICD-10-CM

## 2017-08-16 DIAGNOSIS — D631 Anemia in chronic kidney disease: Secondary | ICD-10-CM | POA: Diagnosis present

## 2017-08-16 DIAGNOSIS — Z833 Family history of diabetes mellitus: Secondary | ICD-10-CM

## 2017-08-16 DIAGNOSIS — D72829 Elevated white blood cell count, unspecified: Secondary | ICD-10-CM | POA: Diagnosis not present

## 2017-08-16 DIAGNOSIS — F419 Anxiety disorder, unspecified: Secondary | ICD-10-CM | POA: Diagnosis present

## 2017-08-16 DIAGNOSIS — D638 Anemia in other chronic diseases classified elsewhere: Secondary | ICD-10-CM | POA: Diagnosis not present

## 2017-08-16 DIAGNOSIS — R0989 Other specified symptoms and signs involving the circulatory and respiratory systems: Secondary | ICD-10-CM | POA: Diagnosis not present

## 2017-08-16 DIAGNOSIS — Z87891 Personal history of nicotine dependence: Secondary | ICD-10-CM

## 2017-08-16 DIAGNOSIS — E113521 Type 2 diabetes mellitus with proliferative diabetic retinopathy with traction retinal detachment involving the macula, right eye: Secondary | ICD-10-CM

## 2017-08-16 DIAGNOSIS — I798 Other disorders of arteries, arterioles and capillaries in diseases classified elsewhere: Secondary | ICD-10-CM | POA: Diagnosis not present

## 2017-08-16 DIAGNOSIS — Z89411 Acquired absence of right great toe: Secondary | ICD-10-CM | POA: Diagnosis not present

## 2017-08-16 DIAGNOSIS — G8918 Other acute postprocedural pain: Secondary | ICD-10-CM

## 2017-08-16 DIAGNOSIS — E1152 Type 2 diabetes mellitus with diabetic peripheral angiopathy with gangrene: Secondary | ICD-10-CM | POA: Diagnosis not present

## 2017-08-16 DIAGNOSIS — Z7951 Long term (current) use of inhaled steroids: Secondary | ICD-10-CM

## 2017-08-16 DIAGNOSIS — I509 Heart failure, unspecified: Secondary | ICD-10-CM | POA: Diagnosis not present

## 2017-08-16 DIAGNOSIS — N2581 Secondary hyperparathyroidism of renal origin: Secondary | ICD-10-CM | POA: Diagnosis present

## 2017-08-16 LAB — RENAL FUNCTION PANEL
Albumin: 2.6 g/dL — ABNORMAL LOW (ref 3.5–5.0)
Anion gap: 13 (ref 5–15)
BUN: 34 mg/dL — ABNORMAL HIGH (ref 6–20)
CO2: 26 mmol/L (ref 22–32)
Calcium: 8.6 mg/dL — ABNORMAL LOW (ref 8.9–10.3)
Chloride: 92 mmol/L — ABNORMAL LOW (ref 101–111)
Creatinine, Ser: 5.96 mg/dL — ABNORMAL HIGH (ref 0.44–1.00)
GFR calc Af Amer: 8 mL/min — ABNORMAL LOW (ref >60)
GFR calc non Af Amer: 7 mL/min — ABNORMAL LOW (ref >60)
Glucose, Bld: 95 mg/dL (ref 65–99)
Phosphorus: 4.3 mg/dL (ref 2.5–4.6)
Potassium: 3.8 mmol/L (ref 3.5–5.1)
Sodium: 131 mmol/L — ABNORMAL LOW (ref 135–145)

## 2017-08-16 LAB — CBC
HCT: 25.8 % — ABNORMAL LOW (ref 36.0–46.0)
Hemoglobin: 8.2 g/dL — ABNORMAL LOW (ref 12.0–15.0)
MCH: 31.1 pg (ref 26.0–34.0)
MCHC: 31.8 g/dL (ref 30.0–36.0)
MCV: 97.7 fL (ref 78.0–100.0)
Platelets: 273 10*3/uL (ref 150–400)
RBC: 2.64 MIL/uL — ABNORMAL LOW (ref 3.87–5.11)
RDW: 15 % (ref 11.5–15.5)
WBC: 12 10*3/uL — ABNORMAL HIGH (ref 4.0–10.5)

## 2017-08-16 LAB — GLUCOSE, CAPILLARY
Glucose-Capillary: 105 mg/dL — ABNORMAL HIGH (ref 65–99)
Glucose-Capillary: 149 mg/dL — ABNORMAL HIGH (ref 65–99)
Glucose-Capillary: 180 mg/dL — ABNORMAL HIGH (ref 65–99)
Glucose-Capillary: 217 mg/dL — ABNORMAL HIGH (ref 65–99)

## 2017-08-16 MED ORDER — ASPIRIN 81 MG PO CHEW
81.0000 mg | CHEWABLE_TABLET | Freq: Every evening | ORAL | Status: DC
  Administered 2017-08-16 – 2017-08-22 (×7): 81 mg via ORAL
  Filled 2017-08-16 (×7): qty 1

## 2017-08-16 MED ORDER — ACETAMINOPHEN 325 MG PO TABS: 325.0000 mg | ORAL_TABLET | ORAL | Status: DC | PRN

## 2017-08-16 MED ORDER — ALUMINUM HYDROXIDE GEL 320 MG/5ML PO SUSP
10.0000 mL | ORAL | Status: DC | PRN
  Filled 2017-08-16: qty 30

## 2017-08-16 MED ORDER — FLEET ENEMA 7-19 GM/118ML RE ENEM: 1.0000 | ENEMA | Freq: Once | RECTAL | Status: DC | PRN

## 2017-08-16 MED ORDER — PROMETHAZINE HCL 25 MG/ML IJ SOLN
12.5000 mg | Freq: Four times a day (QID) | INTRAMUSCULAR | Status: DC | PRN
  Filled 2017-08-16: qty 1

## 2017-08-16 MED ORDER — AMLODIPINE BESYLATE 5 MG PO TABS
5.0000 mg | ORAL_TABLET | Freq: Every day | ORAL | Status: DC
  Administered 2017-08-17 – 2017-08-22 (×6): 5 mg via ORAL
  Filled 2017-08-16 (×7): qty 1

## 2017-08-16 MED ORDER — ALBUTEROL SULFATE (2.5 MG/3ML) 0.083% IN NEBU: 2.5000 mg | INHALATION_SOLUTION | Freq: Four times a day (QID) | RESPIRATORY_TRACT | Status: DC | PRN

## 2017-08-16 MED ORDER — FLUTICASONE PROPIONATE 50 MCG/ACT NA SUSP
1.0000 | Freq: Every day | NASAL | Status: DC | PRN
  Administered 2017-08-20: 1 via NASAL
  Filled 2017-08-16 (×2): qty 16

## 2017-08-16 MED ORDER — LORATADINE 10 MG PO TABS: 10.0000 mg | ORAL_TABLET | Freq: Every day | ORAL | Status: DC | PRN

## 2017-08-16 MED ORDER — PROMETHAZINE HCL 12.5 MG PO TABS
12.5000 mg | ORAL_TABLET | Freq: Four times a day (QID) | ORAL | Status: DC | PRN
  Administered 2017-08-19: 12.5 mg via ORAL
  Filled 2017-08-16: qty 1

## 2017-08-16 MED ORDER — SUCROFERRIC OXYHYDROXIDE 500 MG PO CHEW
500.0000 mg | CHEWABLE_TABLET | ORAL | Status: DC | PRN
  Filled 2017-08-16: qty 1

## 2017-08-16 MED ORDER — INSULIN DETEMIR 100 UNIT/ML ~~LOC~~ SOLN
10.0000 [IU] | Freq: Every day | SUBCUTANEOUS | Status: DC
  Administered 2017-08-16 – 2017-08-22 (×7): 10 [IU] via SUBCUTANEOUS
  Filled 2017-08-16 (×7): qty 0.1

## 2017-08-16 MED ORDER — PHENOL 1.4 % MT LIQD: 1.0000 | OROMUCOSAL | Status: DC | PRN

## 2017-08-16 MED ORDER — MOMETASONE FURO-FORMOTEROL FUM 200-5 MCG/ACT IN AERO
2.0000 | INHALATION_SPRAY | Freq: Two times a day (BID) | RESPIRATORY_TRACT | Status: DC
  Administered 2017-08-17 – 2017-08-23 (×10): 2 via RESPIRATORY_TRACT
  Filled 2017-08-16: qty 8.8

## 2017-08-16 MED ORDER — INSULIN ASPART 100 UNIT/ML ~~LOC~~ SOLN
0.0000 [IU] | Freq: Every day | SUBCUTANEOUS | Status: DC
  Administered 2017-08-16 – 2017-08-20 (×3): 2 [IU] via SUBCUTANEOUS

## 2017-08-16 MED ORDER — PANTOPRAZOLE SODIUM 40 MG PO TBEC
40.0000 mg | DELAYED_RELEASE_TABLET | Freq: Every day | ORAL | Status: DC
  Administered 2017-08-17 – 2017-08-22 (×6): 40 mg via ORAL
  Filled 2017-08-16 (×7): qty 1

## 2017-08-16 MED ORDER — HYDROCODONE-ACETAMINOPHEN 5-325 MG PO TABS
1.0000 | ORAL_TABLET | ORAL | Status: DC | PRN
  Administered 2017-08-18 (×2): 2 via ORAL
  Administered 2017-08-19: 1 via ORAL
  Administered 2017-08-20 (×2): 2 via ORAL
  Filled 2017-08-16 (×4): qty 2

## 2017-08-16 MED ORDER — ISOSORBIDE DINITRATE 20 MG PO TABS
20.0000 mg | ORAL_TABLET | Freq: Two times a day (BID) | ORAL | Status: DC
  Administered 2017-08-17 – 2017-08-22 (×11): 20 mg via ORAL
  Filled 2017-08-16 (×14): qty 1

## 2017-08-16 MED ORDER — DIAZEPAM 5 MG PO TABS
5.0000 mg | ORAL_TABLET | Freq: Two times a day (BID) | ORAL | Status: DC | PRN
  Administered 2017-08-17 – 2017-08-22 (×6): 5 mg via ORAL
  Filled 2017-08-16 (×7): qty 1

## 2017-08-16 MED ORDER — DOCUSATE SODIUM 100 MG PO CAPS: 100.0000 mg | ORAL_CAPSULE | Freq: Every day | ORAL | Status: DC

## 2017-08-16 MED ORDER — BISACODYL 10 MG RE SUPP
10.0000 mg | Freq: Every day | RECTAL | Status: DC | PRN
  Administered 2017-08-18: 10 mg via RECTAL
  Filled 2017-08-16: qty 1

## 2017-08-16 MED ORDER — TRAMADOL HCL 50 MG PO TABS: 50.0000 mg | ORAL_TABLET | Freq: Four times a day (QID) | ORAL | Status: DC | PRN

## 2017-08-16 MED ORDER — HYDROCODONE-ACETAMINOPHEN 5-325 MG PO TABS
ORAL_TABLET | ORAL | Status: AC
  Administered 2017-08-16: 2 via ORAL
  Filled 2017-08-16: qty 2

## 2017-08-16 MED ORDER — TRAZODONE HCL 50 MG PO TABS: 25.0000 mg | ORAL_TABLET | Freq: Every evening | ORAL | Status: DC | PRN

## 2017-08-16 MED ORDER — DOXERCALCIFEROL 4 MCG/2ML IV SOLN
6.0000 ug | INTRAVENOUS | Status: DC
  Administered 2017-08-20: 6 ug via INTRAVENOUS
  Filled 2017-08-16 (×3): qty 4

## 2017-08-16 MED ORDER — DIPHENHYDRAMINE HCL 12.5 MG/5ML PO ELIX
12.5000 mg | ORAL_SOLUTION | Freq: Four times a day (QID) | ORAL | Status: DC | PRN
  Administered 2017-08-19: 25 mg via ORAL
  Filled 2017-08-16: qty 10

## 2017-08-16 MED ORDER — POLYETHYLENE GLYCOL 3350 17 G PO PACK: 17.0000 g | PACK | Freq: Every day | ORAL | Status: DC | PRN

## 2017-08-16 MED ORDER — PROMETHAZINE HCL 12.5 MG RE SUPP
12.5000 mg | Freq: Four times a day (QID) | RECTAL | Status: DC | PRN
  Filled 2017-08-16: qty 1

## 2017-08-16 MED ORDER — ATORVASTATIN CALCIUM 20 MG PO TABS
20.0000 mg | ORAL_TABLET | Freq: Every day | ORAL | Status: DC
  Administered 2017-08-16 – 2017-08-22 (×7): 20 mg via ORAL
  Filled 2017-08-16 (×7): qty 1

## 2017-08-16 MED ORDER — RENA-VITE PO TABS
1.0000 | ORAL_TABLET | Freq: Every day | ORAL | Status: DC
  Administered 2017-08-16 – 2017-08-22 (×7): 1 via ORAL
  Filled 2017-08-16 (×7): qty 1

## 2017-08-16 MED ORDER — SUCROFERRIC OXYHYDROXIDE 500 MG PO CHEW
1000.0000 mg | CHEWABLE_TABLET | Freq: Three times a day (TID) | ORAL | Status: DC
  Administered 2017-08-16 – 2017-08-19 (×8): 1000 mg via ORAL
  Filled 2017-08-16 (×9): qty 2

## 2017-08-16 MED ORDER — CALCIUM ACETATE (PHOS BINDER) 667 MG PO CAPS
1334.0000 mg | ORAL_CAPSULE | Freq: Three times a day (TID) | ORAL | Status: DC
  Administered 2017-08-16 – 2017-08-19 (×7): 1334 mg via ORAL
  Filled 2017-08-16 (×7): qty 2

## 2017-08-16 MED ORDER — BISACODYL 5 MG PO TBEC
5.0000 mg | DELAYED_RELEASE_TABLET | Freq: Every day | ORAL | Status: DC
  Administered 2017-08-17 – 2017-08-23 (×6): 5 mg via ORAL
  Filled 2017-08-16 (×6): qty 1

## 2017-08-16 MED ORDER — DARBEPOETIN ALFA 150 MCG/0.3ML IJ SOSY
PREFILLED_SYRINGE | INTRAMUSCULAR | Status: AC
  Administered 2017-08-16: 150 ug via INTRAVENOUS
  Filled 2017-08-16: qty 0.3

## 2017-08-16 MED ORDER — GUAIFENESIN-DM 100-10 MG/5ML PO SYRP: 15.0000 mL | ORAL_SOLUTION | ORAL | Status: DC | PRN

## 2017-08-16 MED ORDER — HEPARIN SODIUM (PORCINE) 5000 UNIT/ML IJ SOLN
5000.0000 [IU] | Freq: Three times a day (TID) | INTRAMUSCULAR | Status: DC
  Administered 2017-08-16 – 2017-08-23 (×19): 5000 [IU] via SUBCUTANEOUS
  Filled 2017-08-16 (×20): qty 1

## 2017-08-16 MED ORDER — GABAPENTIN 100 MG PO CAPS
100.0000 mg | ORAL_CAPSULE | Freq: Every day | ORAL | Status: DC
  Administered 2017-08-16 – 2017-08-19 (×4): 100 mg via ORAL
  Filled 2017-08-16 (×4): qty 1

## 2017-08-16 MED ORDER — DARBEPOETIN ALFA 150 MCG/0.3ML IJ SOSY
150.0000 ug | PREFILLED_SYRINGE | INTRAMUSCULAR | Status: DC
  Filled 2017-08-16: qty 0.3

## 2017-08-16 MED ORDER — CLOPIDOGREL BISULFATE 75 MG PO TABS
75.0000 mg | ORAL_TABLET | Freq: Every day | ORAL | Status: DC
  Administered 2017-08-17 – 2017-08-22 (×6): 75 mg via ORAL
  Filled 2017-08-16 (×7): qty 1

## 2017-08-16 MED ORDER — INSULIN ASPART 100 UNIT/ML ~~LOC~~ SOLN
0.0000 [IU] | Freq: Three times a day (TID) | SUBCUTANEOUS | Status: DC
  Administered 2017-08-16 – 2017-08-17 (×4): 2 [IU] via SUBCUTANEOUS
  Administered 2017-08-18: 1 [IU] via SUBCUTANEOUS
  Administered 2017-08-18: 2 [IU] via SUBCUTANEOUS
  Administered 2017-08-19 (×2): 1 [IU] via SUBCUTANEOUS
  Administered 2017-08-20 – 2017-08-21 (×4): 2 [IU] via SUBCUTANEOUS
  Administered 2017-08-22: 1 [IU] via SUBCUTANEOUS
  Administered 2017-08-22: 2 [IU] via SUBCUTANEOUS
  Administered 2017-08-22: 1 [IU] via SUBCUTANEOUS

## 2017-08-16 NOTE — Progress Notes (Signed)
Destiny Gong, RN  Rehab Admission Coordinator  Physical Medicine and Rehabilitation  PMR Pre-admission  Signed  Date of Service:  08/16/2017 11:10 AM       Related encounter: Admission (Discharged) from 08/11/2017 in [facility] 4E CV SURGICAL PROGRESSIVE CARE      Signed           _0 Hide copied text  _1 Hover for details   PMR Admission Coordinator Pre-Admission Assessment  Patient: Destiny Day is an 67 y.o., female MRN: [id number] DOB: September 12, 1950 Height: 5' 4.5" (163.8 cm) Weight: 68.2 kg (150 lb 5.7 oz)                                                                                                                                                  Insurance Information HMO:     PPO:      PCP:      IPA:      80/20: yes     OTHER: no HMO PRIMARY: Medicare Day and b      Policy#: [id number]      Subscriber: pt Benefits:  Phone #: online passport one     Name: 08/16/2017 Eff. Date: 02/25/2016     Deduct: $1364      Out of Pocket Max: none      Life Max: none CIR: 100%      SNF: 20 full days Outpatient: 80%     Co-Pay: 20% Home Health: 100%      Co-Pay: none DME: 80%     Co-Pay: 20% Providers: pt choice  SECONDARY: [employer]      Policy#: [id number]      Subscriber: pt  Medicaid Application Date:       Case Manager:  Disability Application Date:       Case Worker:   Emergency [place]    Name Relation Home Work Destiny Day Spouse [phone]  [phone]   Destiny Day Daughter [phone]     Destiny Day [phone]       Current Medical History  Patient Admitting Diagnosis: functional deficits secondary to RLE bypass and toe amputation  History of Present Illness: OFB:PZWCHENID Day Destiny Day 67 y.o.right handed femalewith history of end-stage renal disease with hemodialysis, diastolic congestive heart failure, diabetes mellitus, hypertension,PVD, 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 Presented 08/11/2017 with right foot ischemia and gangrenous changes of the right great toe.Underwent right femoral to below knee popliteal bypass as well as right great toe amputation 08/11/2017 per Dr. Donnetta Day. Weightbearing as tolerated through heel. Hospital course pain management. Hemodialysis ongoing as per renal services. Subcutaneous heparin for DVT prophylaxis. Acute blood loss anemia 10.0 and monitored.   Past Medical History  Past Medical History:  Diagnosis Date  . Abdominal bruit   . Anemia   .  Anxiety   . Arthritis    Osteoarthritis  . Asthma   . Cervical disc disease    "pinced nerve"  . CHF (congestive heart failure) ([place])   . Complication of anesthesia    " after I got home from my last procedure, I started itching."  . Diabetes mellitus    Type II  . Diverticulitis   . ESRD on peritoneal dialysis [facility])    Hemodialysis - MWF- [place]   . GERD (gastroesophageal reflux disease)    from medications  . GI bleed 03/31/2013  . History of hiatal hernia   . Hyperlipidemia   . Hypertension   . Neuropathy    left leg  . Osteoporosis   . Peripheral vascular disease ([place])   . Pneumonia    "very young"  . Seasonal allergies   . Shortness of breath dyspnea    WIth exertion  . Sleep apnea    can't afford cpap    Family History  family history includes Diabetes in her father; Other in her mother; Pancreatic cancer in her maternal grandmother.  Prior Rehab/Hospitalizations:  Has the patient had major surgery during 100 days prior to admission? No   SNF at [facility] last year and pt left after 8 days due to not happy about nursing care there.   Current Medications   Current Facility-Administered Medications:  .  0.9 %  sodium chloride infusion, 500 mL, Intravenous, Once PRN, Theda Sers, Destiny M, PA-C .  0.9 %  sodium chloride infusion, , Intravenous, Continuous, Destiny Day, Destiny M, PA-C .  acetaminophen (TYLENOL)  tablet 325-650 mg, 325-650 mg, Oral, Q4H PRN **OR** acetaminophen (TYLENOL) suppository 325-650 mg, 325-650 mg, Rectal, Q4H PRN, Theda Sers, Destiny M, PA-C .  albuterol (PROVENTIL) (2.5 MG/3ML) 0.083% nebulizer solution 2.5 mg, 2.5 mg, Inhalation, Q6H PRN, Theda Sers, Destiny M, PA-C .  amLODipine (NORVASC) tablet 5 mg, 5 mg, Oral, QHS, Destiny Day, Destiny Day, [place], 5 mg at 08/15/17 2118 .  aspirin chewable tablet 81 mg, 81 mg, Oral, QPM, Destiny Amor, PA-C, 81 mg at 08/15/17 1743 .  atorvastatin (LIPITOR) tablet 20 mg, 20 mg, Oral, QHS, Destiny Day, Destiny M, PA-C, 20 mg at 08/15/17 2118 .  bisacodyl (DULCOLAX) EC tablet 5 mg, 5 mg, Oral, Daily PRN, Destiny Slate M, PA-C .  calcium acetate (PHOSLO) capsule 1,334 mg, 1,334 mg, Oral, TID WC, Destiny Day, Destiny M, PA-C, 1,334 mg at 08/15/17 6144 .  clopidogrel (PLAVIX) tablet 75 mg, 75 mg, Oral, Daily, Destiny Slate M, PA-C, 75 mg at 08/15/17 1034 .  Darbepoetin Alfa (ARANESP) injection 150 mcg, 150 mcg, Intravenous, Q Mon-HD, Destiny Day, James, MD, 150 mcg at 08/16/17 1013 .  diazepam (VALIUM) tablet 5 mg, 5 mg, Oral, Q12H PRN, Destiny Amor, PA-C, 5 mg at 08/14/17 1124 .  docusate sodium (COLACE) capsule 100 mg, 100 mg, Oral, Daily, Destiny Slate M, PA-C, 100 mg at 08/15/17 1034 .  doxercalciferol (HECTOROL) injection 6 mcg, 6 mcg, Intravenous, Q Day,W,F-HD, Destiny Day, Lindsay, PA, 6 mcg at 08/13/17 1529 .  ferric citrate (AURYXIA) tablet 420 mg, 420 mg, Oral, TID WC, Destiny Day, Destiny M, PA-C, 420 mg at 08/15/17 1035 .  fluticasone (FLONASE) 50 MCG/ACT nasal spray 1 spray, 1 spray, Each Nare, Daily PRN, Theda Sers, Destiny M, PA-C .  gabapentin (NEURONTIN) capsule 100 mg, 100 mg, Oral, QHS, Destiny Day, Destiny M, PA-C, 100 mg at 08/15/17 2118 .  guaiFENesin-dextromethorphan (ROBITUSSIN DM) 100-10 MG/5ML syrup 15 mL, 15 mL, Oral, Q4H PRN, Theda Sers, Destiny M, PA-C .  heparin injection  5,000 Units, 5,000 Units, Subcutaneous, Q8H, Destiny Day, Destiny Day, [place], 5,000 Units at 08/16/17 0532 .  hydrALAZINE  (APRESOLINE) injection 5 mg, 5 mg, Intravenous, Q20 Min PRN, Theda Sers, Destiny M, PA-C .  HYDROcodone-acetaminophen (NORCO/VICODIN) 5-325 MG per tablet 1-2 tablet, 1-2 tablet, Oral, Q4H PRN, Destiny Amor, PA-C, 2 tablet at 08/16/17 1019 .  insulin aspart (novoLOG) injection 0-5 Units, 0-5 Units, Subcutaneous, QHS, Destiny Day, [place], 2 Units at 08/15/17 2233 .  insulin aspart (novoLOG) injection 0-9 Units, 0-9 Units, Subcutaneous, TID WC, Destiny Day, Destiny M, PA-C, 1 Units at 08/15/17 1743 .  insulin detemir (LEVEMIR) injection 10 Units, 10 Units, Subcutaneous, QHS, Destiny Day, [place], 10 Units at 08/15/17 2234 .  isosorbide dinitrate (ISORDIL) tablet 20 mg, 20 mg, Oral, BID, Destiny Slate M, PA-C, 20 mg at 08/15/17 2118 .  labetalol (NORMODYNE,TRANDATE) injection 10 mg, 10 mg, Intravenous, Q10 min PRN, Theda Sers, Destiny M, PA-C .  loratadine (CLARITIN) tablet 10 mg, 10 mg, Oral, Daily PRN, Theda Sers, Destiny M, PA-C .  metoprolol tartrate (LOPRESSOR) injection 2-5 mg, 2-5 mg, Intravenous, Q2H PRN, Destiny Day, Destiny M, PA-C .  mometasone-formoterol [facility]) 200-5 MCG/ACT inhaler 2 puff, 2 puff, Inhalation, BID, Destiny Slate M, PA-C, 2 puff at 08/15/17 2110 .  morphine 2 MG/ML injection 1-2 mg, 1-2 mg, Intravenous, Q1H PRN, Destiny Slate M, PA-C, 2 mg at 08/13/17 2015 .  multivitamin (RENA-VIT) tablet 1 tablet, 1 tablet, Oral, QHS, Destiny Day, Destiny M, PA-C, 1 tablet at 08/15/17 2118 .  ondansetron (ZOFRAN) injection 4 mg, 4 mg, Intravenous, Q6H PRN, Destiny Amor, PA-C, 4 mg at 08/14/17 0433 .  pantoprazole (PROTONIX) EC tablet 40 mg, 40 mg, Oral, Daily, Destiny Slate M, PA-C, 40 mg at 08/15/17 1034 .  phenol (CHLORASEPTIC) mouth spray 1 spray, 1 spray, Mouth/Throat, PRN, Destiny Day, Destiny M, PA-C .  promethazine (PHENERGAN) injection 12.5 mg, 12.5 mg, Intravenous, Q6H PRN, Mauricia Area, MD, 12.5 mg at 08/15/17 1129 .  senna-docusate (Senokot-S) tablet 1 tablet, 1 tablet, Oral, QHS PRN, Theda Sers, Destiny M, PA-C .   sucroferric oxyhydroxide [facility]) chewable tablet 1,000 mg, 1,000 mg, Oral, TID WC, Destiny Day, Destiny M, PA-C, 1,000 mg at 08/15/17 0815 .  sucroferric oxyhydroxide (VELPHORO) chewable tablet 500 mg, 500 mg, Oral, PRN, Early, Arvilla Meres, MD  Patients Current Diet: Diet renal/carb modified with fluid restriction Diet-HS Snack? Nothing; Fluid restriction: 1200 mL Fluid; Room service appropriate? Yes; Fluid consistency: Thin  Precautions / Restrictions Precautions Precautions: Fall Other Brace/Splint: Darco shoe on Rt Restrictions Weight Bearing Restrictions: Yes RLE Weight Bearing: Weight bearing as tolerated   Has the patient had 2 or more falls or Day fall with injury in the past year?No  Prior Activity Level Community (5-7x/wk): Pt used cane and drove self to oupt dialysis pta  Development worker, international aid / [place] Devices/Equipment: Wheelchair, CBG Meter Home Equipment: Environmental consultant - 4 wheels, [place] - single point, Civil engineer, contracting, Crutches  Prior Device Use: Indicate devices/aids used by the patient prior to current illness, exacerbation or injury? cane  Prior Functional Level Prior Function Level of Independence: Independent with assistive device(s) Comments: walked with cane  Self Care: Did the patient need help bathing, dressing, using the toilet or eating?  Independent  Indoor Mobility: Did the patient need assistance with walking from room to room (with or without device)? Independent  Stairs: Did the patient need assistance with internal or external stairs (with or without device)? Independent  Functional Cognition: Did the patient need help planning regular tasks  such as shopping or remembering to take medications? Independent  Current Functional Level Cognition  Overall Cognitive Status: Within Functional Limits for tasks assessed Orientation Level: Oriented X4    Extremity Assessment (includes Sensation/Coordination)  Upper Extremity Assessment: Defer  to OT evaluation  Lower Extremity Assessment: Generalized weakness    ADLs  Overall ADL's : Needs assistance/impaired Eating/Feeding: Independent, Sitting Grooming: Wash/dry face, Sitting, Set up Upper Body Bathing: Supervision/ safety, Sitting Lower Body Bathing: Maximal assistance, Sit to/from stand Upper Body Dressing : Set up, Sitting Lower Body Dressing: Total assistance, Sit to/from stand Toilet Transfer: Minimal assistance, Stand-pivot, RW Toilet Transfer Details (indicate cue type and reason): simulated to chair Toileting- Clothing Manipulation and Hygiene: Minimal assistance, Sit to/from stand General ADL Comments: Pt vomited upon reaching EOB, RN aware and gave meds    Mobility  Overal bed mobility: Needs Assistance Bed Mobility: Supine to Sit Supine to sit: Mod assist, HOB elevated General bed mobility comments: assist for R LE to EOB, to raise trunk and position hips at EOB    Transfers  Overall transfer level: Needs assistance Equipment used: Rolling walker (2 wheeled) Transfers: Sit to/from Stand, [employer] Transfers Sit to Stand: Mod assist Stand pivot transfers: Min assist General transfer comment: assist to rise and gain balance, cues for hand and L foot placement. Bed to chair with small pivotal steps with walker. Unsteady requiring assist for balance.    Ambulation / Gait / Stairs / Wheelchair Mobility  Ambulation/Gait General Gait Details: Not attempted more than bed to chair due to nausea and vomiting.    Posture / Balance Balance Overall balance assessment: Needs assistance Sitting-balance support: No upper extremity supported Sitting balance-Leahy Scale: Good Standing balance support: Bilateral upper extremity supported Standing balance-Leahy Scale: Poor Standing balance comment: walker and min assist for static standing    Special needs/care consideration BiPAP/CPAP n/Day CPM  N/Day Continuous Drip IV  N/Day Dialysis [facility] MWF drove self;  states SCAT has already been arranged Life Vest n/Day Oxygen  N/Day Special Bed  N/Day Trach Size  N/Day Wound Vac n/Day Skin  Surgical incision Bowel mgmt: LBM 1/17 continent Bladder mgmt: no urine Diabetic mgmt  yea   Previous Home Environment Living Arrangements: Spouse/significant other  Lives With: Spouse Available Help at Discharge: Family, Available 24 hours/day Type of Home: House Home Layout: Multi-level, Bed/bath upstairs Alternate Level Stairs-Rails: Left Alternate Level Stairs-Number of Steps: 6 and 6 split level Home Access: Stairs to enter Entrance Stairs-Rails: Left Entrance Stairs-Number of Steps: 2 Bathroom Shower/Tub: Tub/shower unit, Architectural technologist: Standard Bathroom Accessibility: Yes How Accessible: Accessible via walker Home Care Services: No  Discharge Living Setting Plans for Discharge Living Setting: Patient's home, Lives with (comment)(spouse) Type of Home at Discharge: House Discharge Home Layout: Multi-level(split level) Alternate Level Stairs-Rails: Left Alternate Level Stairs-Number of Steps: 6 and 6 split level Discharge Home Access: Stairs to enter Entrance Stairs-Rails: Left Entrance Stairs-Number of Steps: 2 Discharge Bathroom Shower/Tub: Tub/shower unit, Curtain Discharge Bathroom Toilet: Standard Discharge Bathroom Accessibility: Yes How Accessible: Accessible via walker Does the patient have any problems obtaining your medications?: No  Social/Family/Support Systems Patient Roles: Spouse Contact Information: spouse Anticipated Caregiver: spouse Anticipated Caregiver's Contact Information: see above Ability/Limitations of Caregiver: no limitations Caregiver Availability: 24/7 Discharge Plan Discussed with Primary Caregiver: Yes Is Caregiver In Agreement with Plan?: Yes Does Caregiver/Family have Issues with Lodging/Transportation while Pt is in Rehab?: No  Goals/Additional Needs Patient/Family Goal for Rehab: supervision to  min assist with PT and  OT Expected length of stay: ELOS 10- 13 days Equipment Needs: needs adls kit with reacher per patient Special Service Needs: Hemodialysis MWF as oupatietn; will need SCAT tranportation at d/c Pt/Family Agrees to Admission and willing to participate: Yes Program Orientation Provided & Reviewed with Pt/Caregiver Including Roles  & Responsibilities: Yes  Decrease burden of Care through IP rehab admission: n/Day  Possible need for SNF placement upon discharge:pt states she will not go to SNF after her experience last year with [facility].  Patient Condition: This patient's medical and functional status has changed since the consult dated: 08/13/2017 in which the Rehabilitation Physician determined and documented that the patient's condition is appropriate for intensive rehabilitative care in an inpatient rehabilitation facility. See "History of Present Illness" (above) for medical update. Functional changes are: mod assist. Patient's medical and functional status update has been discussed with the Rehabilitation physician and patient remains appropriate for inpatient rehabilitation. Will admit to inpatient rehab today.  Preadmission Screen Completed By:  Cleatrice Burke, 08/16/2017 11:21 AM ______________________________________________________________________   Discussed status with Dr. Posey Pronto on 08/16/2017 at  1121 and received telephone approval for admission today.  Admission Coordinator:  Cleatrice Burke, time 6895 Date  08/16/2017            Cosigned by: Jamse Arn, MD at 08/16/2017 11:26 AM  Revision History

## 2017-08-16 NOTE — Progress Notes (Signed)
Pt admitted to [phone] with no issues. Pt in no pain and family at bedside. All questions were answered and pt oriented to unit with call bell within reach. Continue plan of care.

## 2017-08-16 NOTE — Telephone Encounter (Signed)
-----   Message from Mena Goes, RN sent at 08/16/2017 11:28 AM EST ----- Regarding: 2-4 WEEKS    ----- Message ----- From: Iline Oven Sent: 08/16/2017  11:14 AM To: [facility] Charge Pool  Can you schedule an appt for this pt in about 2-4 weeks to see Dr. Donnetta Hutching.  PO R fem-pop with PTFE and GT amp. Thanks, [facility]

## 2017-08-16 NOTE — Progress Notes (Signed)
Subjective: Interval History: has no complaint .  Objective: Vital signs in last 24 hours: Temp:  [97.8 F (36.6 C)-99.6 F (37.6 C)] 97.8 F (36.6 C) (01/21 1120) Pulse Rate:  [76-120] 110 (01/21 1120) Resp:  [14-27] 20 (01/21 1120) BP: (110-167)/(43-89) 139/86 (01/21 1120) SpO2:  [94 %-99 %] 98 % (01/21 1120) Weight:  [66 kg (145 lb 8.1 oz)-68.2 kg (150 lb 5.7 oz)] 66 kg (145 lb 8.1 oz) (01/21 1120) Weight change: 0.5 kg (1 lb 1.6 oz)  Intake/Output from previous day: 01/20 0701 - 01/21 0700 In: 570 [P.O.:570] Out: 0  Intake/Output this shift: Total I/O In: -  Out: 2163 [Other:2163]  General appearance: alert, cooperative and no distress Resp: clear to auscultation bilaterally Cardio: S1, S2 normal and systolic murmur: systolic ejection 2/6 GI: soft, non-tender; bowel sounds normal; no masses,  no organomegaly Extremities: avf ok  Lab Results: Recent Labs    08/14/17 1046 08/16/17 0715  WBC 14.9* 12.0*  HGB 9.1* 8.2*  HCT 27.8* 25.8*  PLT 296 273   BMET:  Recent Labs    08/14/17 0749 08/16/17 0715  NA 132* 131*  K 4.0 3.8  CL 93* 92*  CO2 25 26  GLUCOSE 104* 95  BUN 17 34*  CREATININE 3.24* 5.96*  CALCIUM 8.3* 8.6*   No results for input(s): PTH in the last 72 hours. Iron Studies: No results for input(s): IRON, TIBC, TRANSFERRIN, FERRITIN in the last 72 hours.  Studies/Results: No results found.  I have reviewed the patient's current medications.  Assessment: 1 ESRD on HD MWF .  Vol  Ok.  HD today 2 Anemia esa 3 HPTH vit D  4 DM controlled 5 PVD per [facility]  P Rehab, HD, esa, vit D   LOS: 5 days   Destiny Day 08/16/2017,12:09 PM

## 2017-08-16 NOTE — Progress Notes (Signed)
Meredith Staggers, MD      Meredith Staggers, MD  Physician  Physical Medicine and Rehabilitation      Consult Note  Signed     Date of Service:  08/13/2017  8:14 AM         Related encounter: Admission (Discharged) from 08/11/2017 in [facility] 4E CV SURGICAL PROGRESSIVE CARE           Signed          Expand All Collapse All            Expand widget buttonCollapse widget button     Hide copied text   Hover for detailscustomization button                                                                                                                  untitled image              Physical Medicine and Rehabilitation Consult  Reason for Consult: Decreased functional mobility  Referring Physician: Dr. Donnetta Hutching        HPI: Destiny Day is a 67 y.o. right handed female with history of end-stage renal disease with hemodialysis, diastolic congestive heart failure, diabetes mellitus, hypertension,PVD. Per chart review patient lives with spouse. Independent with a cane prior to admission driving. Two-level home with bedroom upstairs. Spouse works during the day Presented 08/11/2017 with right foot ischemia and gangrenous changes of the right great toe. Underwent right femoral to below knee popliteal bypass as well as right great toe amputation 08/11/2017 per Dr. Donnetta Hutching. Weightbearing as tolerated through heel. Hospital course pain management. Hemodialysis ongoing as per renal services. Subcutaneous heparin for DVT prophylaxis. Acute blood loss anemia 10.0 and monitored. Physical therapy evaluation completed 08/12/2017 with recommendations of physical medicine rehabilitation consult.        Review of Systems   Constitutional: Negative for fever.   HENT: Negative for hearing loss.    Eyes: Negative for  blurred vision and double vision.   Respiratory: Negative for cough.         Shortness of breath with exertion   Cardiovascular: Positive for leg swelling. Negative for chest pain and palpitations.   Gastrointestinal: Positive for constipation. Negative for nausea and vomiting.        GERD   Genitourinary: Negative for flank pain and hematuria.   Musculoskeletal: Positive for joint pain and myalgias.   Skin: Negative for rash.   Psychiatric/Behavioral:        Anxiety   All other systems reviewed and are negative.          Past Medical History:    Diagnosis   Date    .   Abdominal bruit        .   Anemia        .   Anxiety        .   Arthritis  Osteoarthritis    .   Asthma        .   Cervical disc disease            "pinced nerve"    .   CHF (congestive heart failure) ([place])        .   Complication of anesthesia            " after I got home from my last procedure, I started itching."    .   Diabetes mellitus            Type II    .   Diverticulitis        .   ESRD on peritoneal dialysis [facility])            Hemodialysis - MWF- [place]     .   GERD (gastroesophageal reflux disease)            from medications    .   GI bleed   03/31/2013    .   History of hiatal hernia        .   Hyperlipidemia        .   Hypertension        .   Neuropathy            left leg    .   Osteoporosis        .   Peripheral vascular disease ([place])        .   Pneumonia            "very young"    .   Seasonal allergies        .   Shortness of breath dyspnea            WIth exertion    .   Sleep apnea            can't afford cpap             Past Surgical History:    Procedure   Laterality   Date    .   A/V SHUNTOGRAM   N/A   09/22/2016        Procedure: A/V Shuntogram - left  arm;  Surgeon: Serafina Mitchell, MD;  Location: [place] CV LAB;  Service: Cardiovascular;  Laterality: N/A;    .   ABDOMINAL HYSTERECTOMY       1993`    .   AMPUTATION   Left   09/01/2016        Procedure: LEFT FOOT TRANSMETATARSAL AMPUTATION;  Surgeon: Newt Minion, MD;  Location: [place];  Service: Orthopedics;  Laterality: Left;    .   AMPUTATION   Right   08/11/2017        Procedure: RIGHT GREAT TOE AMPUTATION DIGIT;  Surgeon: Rosetta Posner, MD;  Location: [facility] OR;  Service: Vascular;  Laterality: Right;    .   AV FISTULA PLACEMENT   Left   04/21/2016        Procedure: INSERTION OF ARTERIOVENOUS (AV) GORE-TEX GRAFT ARM LEFT;  Surgeon: Elam Dutch, MD;  Location: [place];  Service: Vascular;  Laterality: Left;    .   [place] TRANSPOSITION   Left   07/10/2014        Procedure: BASCILIC VEIN TRANSPOSITION;  Surgeon: Angelia Mould, MD;  Location: [place];  Service: Vascular;  Laterality: Left;    .   BASCILIC VEIN TRANSPOSITION  Right   11/08/2014        Procedure: FIRST STAGE BASILIC VEIN TRANSPOSITION;  Surgeon: Angelia Mould, MD;  Location: [place];  Service: Vascular;  Laterality: Right;    .   BASCILIC VEIN TRANSPOSITION   Right   01/18/2015        Procedure: SECOND STAGE BASILIC VEIN TRANSPOSITION;  Surgeon: Angelia Mould, MD;  Location: [facility];  Service: Vascular;  Laterality: Right;    .   ESOPHAGOGASTRODUODENOSCOPY   N/A   03/31/2013        Procedure: ESOPHAGOGASTRODUODENOSCOPY (EGD);  Surgeon: Gatha Mayer, MD;  Location: [facility] ENDOSCOPY;  Service: Endoscopy;  Laterality: N/A;    .   EYE SURGERY                laser surgery    .   FEMORAL-POPLITEAL BYPASS GRAFT   Left   07/08/2016        Procedure: LEFT  FEMORAL-BELOW KNEE POPLITEAL ARTERY BYPASS GRAFT USING 6MM X 80 CM PROPATEN GORETEX GRAFT WITH RINGS.;  Surgeon: Rosetta Posner, MD;  Location: [place];   Service: Vascular;  Laterality: Left;    .   FEMORAL-POPLITEAL BYPASS GRAFT   Right   08/11/2017        Procedure: RIGHT FEMORAL TO BELOW KNEE POPLITEAL ARTERKY  BYPASS GRAFT USING 6MM RINGED PROPATEN GRAFT;  Surgeon: Rosetta Posner, MD;  Location: [place];  Service: Vascular;  Laterality: Right;    .   FISTULOGRAM   Left   10/29/2014        Procedure: FISTULOGRAM;  Surgeon: Angelia Mould, MD;  Location: [facility] CATH LAB;  Service: Cardiovascular;  Laterality: Left;    .   LIGATION OF ARTERIOVENOUS  FISTULA   Left   04/21/2016        Procedure: LIGATION OF ARTERIOVENOUS  FISTULA LEFT ARM;  Surgeon: Elam Dutch, MD;  Location: [place];  Service: Vascular;  Laterality: Left;    .   LOWER EXTREMITY ANGIOGRAPHY   N/A   04/06/2017        Procedure: Lower Extremity Angiography - Right;  Surgeon: Serafina Mitchell, MD;  Location: [place] CV LAB;  Service: Cardiovascular;  Laterality: N/A;    .   PATCH ANGIOPLASTY   Right   01/18/2015        Procedure: BASILIC VEIN PATCH ANGIOPLASTY USING VASCUGUARD PATCH;  Surgeon: Angelia Mould, MD;  Location: [place];  Service: Vascular;  Laterality: Right;    .   PERIPHERAL VASCULAR BALLOON ANGIOPLASTY       09/22/2016        Procedure: Peripheral Vascular Balloon Angioplasty;  Surgeon: Serafina Mitchell, MD;  Location: [place] CV LAB;  Service: Cardiovascular;;  Lt. Fistula    .   PERIPHERAL VASCULAR CATHETERIZATION   N/A   06/23/2016        Procedure: Abdominal Aortogram w/Lower Extremity;  Surgeon: Serafina Mitchell, MD;  Location: [place] CV LAB;  Service: Cardiovascular;  Laterality: N/A;    .   PERIPHERAL VASCULAR CATHETERIZATION       06/23/2016        Procedure: Peripheral Vascular Intervention;  Surgeon: Serafina Mitchell, MD;  Location: [place] CV LAB;  Service: Cardiovascular;;  lt common and external illiac artery             Family History     Problem   Relation   Age of Onset    .  Other   Mother                not sure of cause of death    .   Diabetes   Father        .   Pancreatic cancer   Maternal Grandmother        .   Colon cancer   Neg Hx           Social History:  reports that she quit smoking about 5 years ago. Her smoking use included cigarettes. She has a 14.00 pack-year smoking history. she has never used smokeless tobacco. She reports that she does not drink alcohol or use drugs.  Allergies:         Allergies    Allergen   Reactions    .   Lisinopril   Cough    .   Prednisone   Other (See Comments)            Excessive fluid buildup              Medications Prior to Admission    Medication   Sig   Dispense   Refill    .   acetaminophen (TYLENOL) 500 MG tablet   Take 500 mg by mouth every 6 (six) hours as needed.            Marland Kitchen   albuterol (PROVENTIL HFA;VENTOLIN HFA) 108 (90 BASE) MCG/ACT inhaler   Inhale 1-2 puffs into the lungs every 6 (six) hours as needed for wheezing or shortness of breath.            Marland Kitchen   amLODipine (NORVASC) 10 MG tablet   Take 10 mg by mouth at bedtime.             Marland Kitchen   aspirin 81 MG chewable tablet   Chew 81 mg by mouth every evening.             Marland Kitchen   atorvastatin (LIPITOR) 20 MG tablet   Take 20 mg by mouth at bedtime.             .   budesonide-formoterol (SYMBICORT) 160-4.5 MCG/ACT inhaler   Inhale 1 puff into the lungs daily as needed (for respiratory issues).             .   calcium acetate (PHOSLO) 667 MG capsule   Take 667-1,334 mg by mouth See admin instructions. 1 tablet twice daily with snacks. And 2 tablets 3 times daily with meals            .   carvedilol (COREG) 12.5 MG tablet   Take 12.5 mg by mouth 2 (two) times daily.             .   cinacalcet (SENSIPAR) 30 MG tablet   Take 30 mg by mouth daily with supper.              .   clopidogrel (PLAVIX) 75 MG tablet   TAKE 1 TABLET EVERY DAY   30 tablet   7    .   diazepam (VALIUM) 5 MG tablet   Take 5 mg by mouth every 12 (twelve) hours as needed for anxiety.            .   ferric citrate (AURYXIA) 1 GM 210 MG(Fe) tablet   Take 420 mg by mouth 3 (three) times daily with meals.            Marland Kitchen  fluticasone (FLONASE) 50 MCG/ACT nasal spray   Place 1 spray into both nostrils daily as needed for allergies or rhinitis.            Marland Kitchen   gabapentin (NEURONTIN) 100 MG capsule   Take 1 capsule (100 mg total) by mouth at bedtime. When necessary for neuropathy pain   30 capsule   3    .   HYDROcodone-acetaminophen (NORCO) 5-325 MG tablet   Take 1 tablet by mouth every 6 (six) hours as needed for moderate pain.   30 tablet   0    .   insulin lispro (HUMALOG) 100 UNIT/ML injection   Inject 0-8 Units into the skin 3 (three) times daily before meals. 0-59 = hypoglycemic protocol, 60-149 = 0 units, 150-250 = 5 units, 251-300 = 8 units, >301 notify MD/NP            .   isosorbide dinitrate (ISORDIL) 20 MG tablet   Take 1 tablet by mouth See admin instructions. Take 1 tablet daily on dialysis days and 1 tab twice daily on non dialysis days            .   LEVEMIR FLEXTOUCH 100 UNIT/ML Pen   Inject 10 Units into the skin at bedtime.             .   multivitamin (RENA-VIT) TABS tablet   Take 1 tablet by mouth daily.        5    .   omeprazole (PRILOSEC) 20 MG capsule   Take 20 mg by mouth daily.             .   sucroferric oxyhydroxide (VELPHORO) 500 MG chewable tablet   Chew 500-1,000 mg by mouth See admin instructions. Take 2 tablets (1000 mg) by mouth with meals and 1 tablet (500 mg) with snacks            .   [DISCONTINUED] doxycycline (VIBRA-TABS) 100 MG tablet   Take 1 tablet (100 mg total) by mouth 2 (two) times daily.   60 tablet   0    .    hydrALAZINE (APRESOLINE) 25 MG tablet   Take 1 tablet by mouth See admin instructions. Take 1 tab 3 times a day on dialysis days and 1 tab twice daily on non dialysis days            .   loratadine (CLARITIN) 10 MG tablet   Take 10 mg by mouth daily as needed for allergies.                  Home:  Home Living  Family/patient expects to be discharged to:: Inpatient rehab  Living Arrangements: Spouse/significant other  Available Help at Discharge: Family, Available 24 hours/day  Type of Home: House  Home Access: Stairs to enter  [employer] of Steps: 2  Entrance Stairs-Rails: Left  Home Layout: Multi-level, Bed/bath upstairs  Alternate Level Stairs-Number of Steps: 6 and 6 split level  Alternate Level Stairs-Rails: Left  Bathroom Shower/Tub: Tub/shower unit, Artist: Standard  Bathroom Accessibility: No  Home Equipment: Environmental consultant - 4 wheels, Cane - single point, Civil engineer, contracting, Crutches   Functional History:  Prior Function  Level of Independence: Independent with assistive device(s)  Comments: walked with cane  Functional Status:   Mobility:  Bed Mobility  Overal bed mobility: Needs Assistance  Bed Mobility: Supine to Sit  Supine to sit: Mod assist, HOB elevated  General bed  mobility comments: assist for R LE to EOB, to raise trunk and position hips at EOB  Transfers  Overall transfer level: Needs assistance  Equipment used: Rolling walker (2 wheeled)  Transfers: Sit to/from Stand, [employer] Transfers  Sit to Stand: Mod assist  Stand pivot transfers: Min assist  General transfer comment: assist to rise and gain balance, cues for hand and L foot placement. Bed to chair with small pivotal steps with walker. Unsteady requiring assist for balance.  Ambulation/Gait  General Gait Details: Not attempted more than bed to chair due to nausea and vomiting.       ADL:  ADL  Overall ADL's : Needs  assistance/impaired  Eating/Feeding: Independent, Sitting  Grooming: Wash/dry face, Sitting, Set up  Upper Body Bathing: Supervision/ safety, Sitting  Lower Body Bathing: Maximal assistance, Sit to/from stand  Upper Body Dressing : Set up, Sitting  Lower Body Dressing: Total assistance, Sit to/from stand  Toilet Transfer: Minimal assistance, Stand-pivot, RW  Toilet Transfer Details (indicate cue type and reason): simulated to chair  Toileting- Clothing Manipulation and Hygiene: Minimal assistance, Sit to/from stand  General ADL Comments: Pt vomited upon reaching EOB, RN aware and gave meds     Cognition:  Cognition  Overall Cognitive Status: Within Functional Limits for tasks assessed  Orientation Level: Oriented X4  Cognition  Arousal/Alertness: Awake/alert  Behavior During Therapy: WFL for tasks assessed/performed  Overall Cognitive Status: Within Functional Limits for tasks assessed     Blood pressure (!) 115/50, pulse 82, temperature 98.1 F (36.7 C), temperature source Oral, resp. rate 17, height 5' 4.5" (1.638 m), weight 66.5 kg (146 lb 11.2 oz), SpO2 100 %.  Physical Exam   Vitals reviewed.  Constitutional: She is oriented to person, place, and time. She appears well-developed.   HENT:   Head: Normocephalic.   Eyes: EOM are normal.   Neck: Normal range of motion. Neck supple. No thyromegaly present.   Cardiovascular: Normal rate and regular rhythm.   Respiratory: Effort normal and breath sounds normal. No respiratory distress.   GI: Soft. Bowel sounds are normal. She exhibits no distension.  Musculoskeletal:  Left TMA  Neurological: She is alert and oriented to person, place, and time.  UE 4/5 bilaterally. LE limited by pain   Skin:  Right lower extremity is dressed appropriately tender         Lab Results Last 24 Hours  Imaging Results (Last 48 hours)          Assessment/Plan:  Diagnosis: Functional deficits secondary to right lower extremity bypass graft and right great toe amputation  1.Does the need for close, 24 hr/day medical supervision in concert with the patient's rehab needs make it unreasonable for this patient to be served in a less intensive setting? Yes   2.Co-Morbidities requiring supervision/potential complications: dm2, hypokalemia, end-stage renal disease on hemodialysis, chronic diastolic heart failure, history of left transmetatarsal amputation, hypertension   3.Due to bladder management, bowel management, safety, skin/wound care, disease management, medication administration, pain management and patient education, does the patient require 24 hr/day rehab nursing? Yes   4.Does the patient require coordinated care of a physician, rehab nurse, PT  (1-2 hrs/day, 5 days/week) and OT (1-2 hrs/day, 5 days/week) to address physical and functional deficits in the context of the above medical diagnosis(es)? Yes Addressing deficits in the following areas: balance, endurance, locomotion, strength, transferring, bowel/bladder control, bathing, dressing, feeding, grooming, toileting and psychosocial support   5.Can the patient actively participate in an intensive therapy program of at least 3 hrs of therapy per day at least 5 days per week? Yes   6.The potential for patient to make measurable gains while on inpatient rehab is good   7.Anticipated functional outcomes upon discharge from inpatient rehab are supervision and min assist  with PT, supervision and min assist with OT, n/a with SLP.   8.Estimated rehab length of stay to reach the above functional goals is: 10-13 days   9.Anticipated D/C setting: Home   10.Anticipated post D/C treatments: HH therapy and Outpatient therapy   11.Overall Rehab/Functional Prognosis: good      RECOMMENDATIONS:  This patient's condition is appropriate for continued rehabilitative care in the following setting: CIR  Patient has agreed to participate in recommended program. Yes  Note that insurance prior authorization may be required for reimbursement for recommended care.     Comment: Rehab Admissions Coordinator to follow up.     Thanks,     Meredith Staggers, MD, Mellody Drown         Lavon Paganini Angiulli, PA-C  08/13/2017                Revision History                                        Routing History

## 2017-08-16 NOTE — Progress Notes (Signed)
I met with pt at bedside in hemodialysis. We discussed an inpt rehab admission. She prefers inpt rehab rather than SNF. I spoke with Eveland PA and he is in agreement to admit to CIR today. I will alert RN Cm and SW and make the arrangements to admit today. [phone]

## 2017-08-16 NOTE — Plan of Care (Signed)
Reviewed with patient inpt hemodialysis expectations, discharge plan and goals.

## 2017-08-16 NOTE — Progress Notes (Addendum)
  Progress Note    08/16/2017 9:26 AM 5 Days Post-Op  Subjective:  No new complaints.  Patient seen during HD treatment.   Vitals:   08/16/17 0800 08/16/17 0830  BP: (!) 146/89 (!) 129/56  Pulse: 85 85  Resp: 18 18  Temp:    SpO2:     Physical Exam: Lungs:  Non labored Incisions:  R groin and medial leg incisions stable c/d/i Extremities:  R foot dressing left in place Abdomen:  Soft Neurologic: A&O  CBC    Component Value Date/Time   WBC 12.0 (H) 08/16/2017 0715   RBC 2.64 (L) 08/16/2017 0715   HGB 8.2 (L) 08/16/2017 0715   HCT 25.8 (L) 08/16/2017 0715   PLT 273 08/16/2017 0715   MCV 97.7 08/16/2017 0715   MCH 31.1 08/16/2017 0715   MCHC 31.8 08/16/2017 0715   RDW 15.0 08/16/2017 0715   LYMPHSABS 2.5 10/12/2015 1600   MONOABS 0.9 10/12/2015 1600   EOSABS 0.2 10/12/2015 1600   BASOSABS 0.0 10/12/2015 1600    BMET    Component Value Date/Time   NA 131 (L) 08/16/2017 0715   NA 135 (A) 07/15/2016   K 3.8 08/16/2017 0715   CL 92 (L) 08/16/2017 0715   CO2 26 08/16/2017 0715   GLUCOSE 95 08/16/2017 0715   BUN 34 (H) 08/16/2017 0715   BUN 30 (A) 07/15/2016   CREATININE 5.96 (H) 08/16/2017 0715   CALCIUM 8.6 (L) 08/16/2017 0715   GFRNONAA 7 (L) 08/16/2017 0715   GFRAA 8 (L) 08/16/2017 0715    INR    Component Value Date/Time   INR 1.05 08/11/2017 0909     Intake/Output Summary (Last 24 hours) at 08/16/2017 0926 Last data filed at 08/16/2017 0500 Gross per 24 hour  Intake 420 ml  Output 0 ml  Net 420 ml     Assessment/Plan:  67 y.o. female is s/p  R fem pop bypass with PTFE  5 Days Post-Op   Continue plavix R GT amp site healing well D/c to inpt rehab when bed becomes available    Dagoberto Ligas, PA-C [facility] [phone] 08/16/2017 9:26 AM   I have examined the patient, reviewed and agree with above.  Easily palpable popliteal pulse.  Great toe amputation healing with no involvement of her second toe or metatarsal  head.  Transfer to rehab today.  I am out of town for the rest of the week and we will not follow actively.  Please call if we can provide any assistance on rehab.  I patient in the office for suture removal from her toe amputation in 3 weeks  Curt Jews, MD 08/16/2017 12:32 PM

## 2017-08-16 NOTE — PMR Pre-admission (Signed)
PMR Admission Coordinator Pre-Admission Assessment  Patient: Destiny Day is an 67 y.o., female MRN: [id number] DOB: June 22, 1951 Height: 5' 4.5" (163.8 cm) Weight: 68.2 kg (150 lb 5.7 oz)              Insurance Information HMO:     PPO:      PCP:      IPA:      80/20: yes     OTHER: no HMO PRIMARY: Medicare a and b      Policy#: [id number]      Subscriber: pt Benefits:  Phone #: online passport one     Name: 08/16/2017 Eff. Date: 02/25/2016     Deduct: $1364      Out of Pocket Max: none      Life Max: none CIR: 100%      SNF: 20 full days Outpatient: 80%     Co-Pay: 20% Home Health: 100%      Co-Pay: none DME: 80%     Co-Pay: 20% Providers: pt choice  SECONDARY: [employer]      Policy#: [id number]      Subscriber: pt  Medicaid Application Date:       Case Manager:  Disability Application Date:       Case Worker:   Emergency [place]    Name Relation Home Work [place] Spouse [phone]  [phone]   Ramseur,Sheree Daughter [phone]     Sudie, Bandel [phone]       Current Medical History  Patient Admitting Diagnosis: functional deficits secondary to RLE bypass and toe amputation  History of Present Illness: HPI: Destiny Day a 67 y.o.right handed femalewith history of end-stage renal disease with hemodialysis, diastolic congestive heart failure, diabetes mellitus, hypertension,PVD, 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 Presented 08/11/2017 with right foot ischemia and gangrenous changes of the right great toe.Underwent right femoral to below knee popliteal bypass as well as right great toe amputation 08/11/2017 per Dr. Donnetta Hutching. Weightbearing as tolerated through heel. Hospital course pain management. Hemodialysis ongoing as per renal services. Subcutaneous heparin for DVT prophylaxis. Acute blood loss anemia 10.0 and monitored.     Past Medical History  Past Medical History:  Diagnosis  Date  . Abdominal bruit   . Anemia   . Anxiety   . Arthritis    Osteoarthritis  . Asthma   . Cervical disc disease    "pinced nerve"  . CHF (congestive heart failure) ([place])   . Complication of anesthesia    " after I got home from my last procedure, I started itching."  . Diabetes mellitus    Type II  . Diverticulitis   . ESRD on peritoneal dialysis [facility])    Hemodialysis - MWF- [place]   . GERD (gastroesophageal reflux disease)    from medications  . GI bleed 03/31/2013  . History of hiatal hernia   . Hyperlipidemia   . Hypertension   . Neuropathy    left leg  . Osteoporosis   . Peripheral vascular disease ([place])   . Pneumonia    "very young"  . Seasonal allergies   . Shortness of breath dyspnea    WIth exertion  . Sleep apnea    can't afford cpap    Family History  family history includes Diabetes in her father; Other in her mother; Pancreatic cancer in her maternal grandmother.  Prior Rehab/Hospitalizations:  Has the patient had major surgery during 100 days prior to admission? No   SNF  at [place] last year and pt left after 8 days due to not happy about nursing care there.   Current Medications   Current Facility-Administered Medications:  .  0.9 %  sodium chloride infusion, 500 mL, Intravenous, Once PRN, Theda Sers, Emma M, PA-C .  0.9 %  sodium chloride infusion, , Intravenous, Continuous, Collins, Emma M, PA-C .  acetaminophen (TYLENOL) tablet 325-650 mg, 325-650 mg, Oral, Q4H PRN **OR** acetaminophen (TYLENOL) suppository 325-650 mg, 325-650 mg, Rectal, Q4H PRN, Theda Sers, Emma M, PA-C .  albuterol (PROVENTIL) (2.5 MG/3ML) 0.083% nebulizer solution 2.5 mg, 2.5 mg, Inhalation, Q6H PRN, Theda Sers, Emma M, PA-C .  amLODipine (NORVASC) tablet 5 mg, 5 mg, Oral, QHS, Penninger, Lindsay, [place], 5 mg at 08/15/17 2118 .  aspirin chewable tablet 81 mg, 81 mg, Oral, QPM, Ulyses Amor, PA-C, 81 mg at 08/15/17 1743 .  atorvastatin (LIPITOR) tablet 20 mg, 20 mg, Oral, QHS, Collins,  Emma M, PA-C, 20 mg at 08/15/17 2118 .  bisacodyl (DULCOLAX) EC tablet 5 mg, 5 mg, Oral, Daily PRN, Laurence Slate M, PA-C .  calcium acetate (PHOSLO) capsule 1,334 mg, 1,334 mg, Oral, TID WC, Collins, Emma M, PA-C, 1,334 mg at 08/15/17 4174 .  clopidogrel (PLAVIX) tablet 75 mg, 75 mg, Oral, Daily, Laurence Slate M, PA-C, 75 mg at 08/15/17 1034 .  Darbepoetin Alfa (ARANESP) injection 150 mcg, 150 mcg, Intravenous, Q Mon-HD, Deterding, James, MD, 150 mcg at 08/16/17 1013 .  diazepam (VALIUM) tablet 5 mg, 5 mg, Oral, Q12H PRN, Ulyses Amor, PA-C, 5 mg at 08/14/17 1124 .  docusate sodium (COLACE) capsule 100 mg, 100 mg, Oral, Daily, Laurence Slate M, PA-C, 100 mg at 08/15/17 1034 .  doxercalciferol (HECTOROL) injection 6 mcg, 6 mcg, Intravenous, Q M,W,F-HD, Penninger, Lindsay, PA, 6 mcg at 08/13/17 1529 .  ferric citrate (AURYXIA) tablet 420 mg, 420 mg, Oral, TID WC, Collins, Emma M, PA-C, 420 mg at 08/15/17 1035 .  fluticasone (FLONASE) 50 MCG/ACT nasal spray 1 spray, 1 spray, Each Nare, Daily PRN, Theda Sers, Emma M, PA-C .  gabapentin (NEURONTIN) capsule 100 mg, 100 mg, Oral, QHS, Collins, Emma M, PA-C, 100 mg at 08/15/17 2118 .  guaiFENesin-dextromethorphan (ROBITUSSIN DM) 100-10 MG/5ML syrup 15 mL, 15 mL, Oral, Q4H PRN, Theda Sers, Emma M, PA-C .  heparin injection 5,000 Units, 5,000 Units, Subcutaneous, Q8H, Collins, Emma M, [place], 5,000 Units at 08/16/17 0532 .  hydrALAZINE (APRESOLINE) injection 5 mg, 5 mg, Intravenous, Q20 Min PRN, Theda Sers, Emma M, PA-C .  HYDROcodone-acetaminophen (NORCO/VICODIN) 5-325 MG per tablet 1-2 tablet, 1-2 tablet, Oral, Q4H PRN, Ulyses Amor, PA-C, 2 tablet at 08/16/17 1019 .  insulin aspart (novoLOG) injection 0-5 Units, 0-5 Units, Subcutaneous, QHS, Ulyses Amor, [place], 2 Units at 08/15/17 2233 .  insulin aspart (novoLOG) injection 0-9 Units, 0-9 Units, Subcutaneous, TID WC, Collins, Emma M, PA-C, 1 Units at 08/15/17 1743 .  insulin detemir (LEVEMIR) injection 10  Units, 10 Units, Subcutaneous, QHS, Ulyses Amor, [place], 10 Units at 08/15/17 2234 .  isosorbide dinitrate (ISORDIL) tablet 20 mg, 20 mg, Oral, BID, Laurence Slate M, PA-C, 20 mg at 08/15/17 2118 .  labetalol (NORMODYNE,TRANDATE) injection 10 mg, 10 mg, Intravenous, Q10 min PRN, Theda Sers, Emma M, PA-C .  loratadine (CLARITIN) tablet 10 mg, 10 mg, Oral, Daily PRN, Laurence Slate M, PA-C .  metoprolol tartrate (LOPRESSOR) injection 2-5 mg, 2-5 mg, Intravenous, Q2H PRN, Collins, Emma M, PA-C .  mometasone-formoterol (DULERA) 200-5 MCG/ACT inhaler 2 puff, 2 puff, Inhalation, BID, Collins,  Susette Racer, PA-C, 2 puff at 08/15/17 2110 .  morphine 2 MG/ML injection 1-2 mg, 1-2 mg, Intravenous, Q1H PRN, Laurence Slate M, PA-C, 2 mg at 08/13/17 2015 .  multivitamin (RENA-VIT) tablet 1 tablet, 1 tablet, Oral, QHS, Collins, Emma M, PA-C, 1 tablet at 08/15/17 2118 .  ondansetron (ZOFRAN) injection 4 mg, 4 mg, Intravenous, Q6H PRN, Ulyses Amor, PA-C, 4 mg at 08/14/17 0433 .  pantoprazole (PROTONIX) EC tablet 40 mg, 40 mg, Oral, Daily, Laurence Slate M, PA-C, 40 mg at 08/15/17 1034 .  phenol (CHLORASEPTIC) mouth spray 1 spray, 1 spray, Mouth/Throat, PRN, Collins, Emma M, PA-C .  promethazine (PHENERGAN) injection 12.5 mg, 12.5 mg, Intravenous, Q6H PRN, Mauricia Area, MD, 12.5 mg at 08/15/17 1129 .  senna-docusate (Senokot-S) tablet 1 tablet, 1 tablet, Oral, QHS PRN, Theda Sers, Emma M, PA-C .  sucroferric oxyhydroxide [facility]) chewable tablet 1,000 mg, 1,000 mg, Oral, TID WC, Collins, Emma M, PA-C, 1,000 mg at 08/15/17 0815 .  sucroferric oxyhydroxide (VELPHORO) chewable tablet 500 mg, 500 mg, Oral, PRN, Early, Arvilla Meres, MD  Patients Current Diet: Diet renal/carb modified with fluid restriction Diet-HS Snack? Nothing; Fluid restriction: 1200 mL Fluid; Room service appropriate? Yes; Fluid consistency: Thin  Precautions / Restrictions Precautions Precautions: Fall Other Brace/Splint: Darco shoe on  Rt Restrictions Weight Bearing Restrictions: Yes RLE Weight Bearing: Weight bearing as tolerated   Has the patient had 2 or more falls or a fall with injury in the past year?No  Prior Activity Level Community (5-7x/wk): Pt used cane and drove self to oupt dialysis pta  Development worker, international aid / [place] Devices/Equipment: Wheelchair, CBG Meter Home Equipment: Environmental consultant - 4 wheels, [place] - single point, Civil engineer, contracting, Crutches  Prior Device Use: Indicate devices/aids used by the patient prior to current illness, exacerbation or injury? cane  Prior Functional Level Prior Function Level of Independence: Independent with assistive device(s) Comments: walked with cane  Self Care: Did the patient need help bathing, dressing, using the toilet or eating?  Independent  Indoor Mobility: Did the patient need assistance with walking from room to room (with or without device)? Independent  Stairs: Did the patient need assistance with internal or external stairs (with or without device)? Independent  Functional Cognition: Did the patient need help planning regular tasks such as shopping or remembering to take medications? Independent  Current Functional Level Cognition  Overall Cognitive Status: Within Functional Limits for tasks assessed Orientation Level: Oriented X4    Extremity Assessment (includes Sensation/Coordination)  Upper Extremity Assessment: Defer to OT evaluation  Lower Extremity Assessment: Generalized weakness    ADLs  Overall ADL's : Needs assistance/impaired Eating/Feeding: Independent, Sitting Grooming: Wash/dry face, Sitting, Set up Upper Body Bathing: Supervision/ safety, Sitting Lower Body Bathing: Maximal assistance, Sit to/from stand Upper Body Dressing : Set up, Sitting Lower Body Dressing: Total assistance, Sit to/from stand Toilet Transfer: Minimal assistance, Stand-pivot, RW Toilet Transfer Details (indicate cue type and reason): simulated to  chair Toileting- Clothing Manipulation and Hygiene: Minimal assistance, Sit to/from stand General ADL Comments: Pt vomited upon reaching EOB, RN aware and gave meds    Mobility  Overal bed mobility: Needs Assistance Bed Mobility: Supine to Sit Supine to sit: Mod assist, HOB elevated General bed mobility comments: assist for R LE to EOB, to raise trunk and position hips at EOB    Transfers  Overall transfer level: Needs assistance Equipment used: Rolling walker (2 wheeled) Transfers: Sit to/from Stand, [employer] Transfers Sit to Stand: Mod assist  Stand pivot transfers: Min assist General transfer comment: assist to rise and gain balance, cues for hand and L foot placement. Bed to chair with small pivotal steps with walker. Unsteady requiring assist for balance.    Ambulation / Gait / Stairs / Wheelchair Mobility  Ambulation/Gait General Gait Details: Not attempted more than bed to chair due to nausea and vomiting.    Posture / Balance Balance Overall balance assessment: Needs assistance Sitting-balance support: No upper extremity supported Sitting balance-Leahy Scale: Good Standing balance support: Bilateral upper extremity supported Standing balance-Leahy Scale: Poor Standing balance comment: walker and min assist for static standing    Special needs/care consideration BiPAP/CPAP n/a CPM  N/a Continuous Drip IV  N/a Dialysis [facility] MWF drove self; states SCAT has already been arranged Life Vest n/a Oxygen  N/a Special Bed  N/a Trach Size  N/a Wound Vac n/a Skin  Surgical incision Bowel mgmt: LBM 1/17 continent Bladder mgmt: no urine Diabetic mgmt  yea   Previous Home Environment Living Arrangements: Spouse/significant other  Lives With: Spouse Available Help at Discharge: Family, Available 24 hours/day Type of Home: House Home Layout: Multi-level, Bed/bath upstairs Alternate Level Stairs-Rails: Left Alternate Level Stairs-Number of Steps: 6 and 6 split level Home  Access: Stairs to enter Entrance Stairs-Rails: Left Entrance Stairs-Number of Steps: 2 Bathroom Shower/Tub: Tub/shower unit, Architectural technologist: Standard Bathroom Accessibility: Yes How Accessible: Accessible via walker Home Care Services: No  Discharge Living Setting Plans for Discharge Living Setting: Patient's home, Lives with (comment)(spouse) Type of Home at Discharge: House Discharge Home Layout: Multi-level(split level) Alternate Level Stairs-Rails: Left Alternate Level Stairs-Number of Steps: 6 and 6 split level Discharge Home Access: Stairs to enter Entrance Stairs-Rails: Left Entrance Stairs-Number of Steps: 2 Discharge Bathroom Shower/Tub: Tub/shower unit, Curtain Discharge Bathroom Toilet: Standard Discharge Bathroom Accessibility: Yes How Accessible: Accessible via walker Does the patient have any problems obtaining your medications?: No  Social/Family/Support Systems Patient Roles: Spouse Contact Information: spouse Anticipated Caregiver: spouse Anticipated Caregiver's Contact Information: see above Ability/Limitations of Caregiver: no limitations Caregiver Availability: 24/7 Discharge Plan Discussed with Primary Caregiver: Yes Is Caregiver In Agreement with Plan?: Yes Does Caregiver/Family have Issues with Lodging/Transportation while Pt is in Rehab?: No  Goals/Additional Needs Patient/Family Goal for Rehab: supervision to min assist with PT and OT Expected length of stay: ELOS 10- 13 days Equipment Needs: needs adls kit with reacher per patient Special Service Needs: Hemodialysis MWF as oupatietn; will need SCAT tranportation at d/c Pt/Family Agrees to Admission and willing to participate: Yes Program Orientation Provided & Reviewed with Pt/Caregiver Including Roles  & Responsibilities: Yes  Decrease burden of Care through IP rehab admission: n/a  Possible need for SNF placement upon discharge:pt states she will not go to SNF after her experience last  year with [facility].  Patient Condition: This patient's medical and functional status has changed since the consult dated: 08/13/2017 in which the Rehabilitation Physician determined and documented that the patient's condition is appropriate for intensive rehabilitative care in an inpatient rehabilitation facility. See "History of Present Illness" (above) for medical update. Functional changes are: mod assist. Patient's medical and functional status update has been discussed with the Rehabilitation physician and patient remains appropriate for inpatient rehabilitation. Will admit to inpatient rehab today.  Preadmission Screen Completed By:  Cleatrice Burke, 08/16/2017 11:21 AM ______________________________________________________________________   Discussed status with Dr. Posey Pronto on 08/16/2017 at  1121 and received telephone approval for admission today.  Admission Coordinator:  Cleatrice Burke, time [id number]  Date  08/16/2017

## 2017-08-16 NOTE — IPOC Note (Signed)
Overall Plan of Care [facility]) Patient Details Name: Destiny Day MRN: [id number] DOB: 02-24-1951  Admitting Diagnosis: [facility] Problems: Active Problems:   Debility   Generalized anxiety disorder     Functional Problem List: Nursing Endurance, Pain, Sensory, Skin Integrity, Bowel, Safety, Edema, Medication Management, Motor  PT Balance, Endurance, Edema, Motor, Pain  OT Balance, Endurance, Motor, Pain  SLP    TR         Basic ADL's: OT Grooming, Bathing, Dressing, Toileting     Advanced  ADL's: OT       Transfers: PT Bed Mobility, Bed to Chair, Car, Chief Operating Officer: PT Ambulation, Emergency planning/management officer, Stairs     Additional Impairments: OT    SLP        TR      Anticipated Outcomes Item Anticipated Outcome  Self Feeding no goal  Swallowing      Basic self-care  mod I  Toileting  mod I   Bathroom Transfers mod I to toilet  Bowel/Bladder  Pt will manage bowel and bladder with min assist at discharge.   Transfers  mod I  Locomotion  mod I  Communication     Cognition     Pain  Pt will manage pain at 3 or less on a scale of 0-10 while in rehab.   Safety/Judgment  Pt will remain free of falls and injury with min assist/cues while in rehab.    Therapy Plan: PT Intensity: Minimum of 1-2 x/day ,45 to 90 minutes PT Frequency: 5 out of 7 days PT Duration Estimated Length of Stay: 7-10 days OT Intensity: Minimum of 1-2 x/day, 45 to 90 minutes OT Frequency: 5 out of 7 days OT Duration/Estimated Length of Stay: 7-10 days      Team Interventions: Nursing Interventions Patient/Family Education, Bowel Management, Pain Management, Disease Management/Prevention, Medication Management, Skin Care/Wound Management, Discharge Planning  PT interventions Ambulation/gait training, Community reintegration, DME/adaptive equipment instruction, Neuromuscular re-education, Stair training, UE/LE Strength taining/ROM, Wheelchair  propulsion/positioning, UE/LE Coordination activities, Therapeutic Activities, Pain management, Discharge planning, Training and development officer, Functional mobility training, Patient/family education, Splinting/orthotics, Therapeutic Exercise  OT Interventions Training and development officer, Discharge planning, Functional mobility training, DME/adaptive equipment instruction, Patient/family education, Self Care/advanced ADL retraining, Therapeutic Activities, Therapeutic Exercise, UE/LE Strength taining/ROM  SLP Interventions    TR Interventions    SW/CM Interventions Discharge Planning, Psychosocial Support, Patient/Family Education   Barriers to Discharge MD  Medical stability and Hemodialysis  Nursing      PT Inaccessible home environment split level home  OT      SLP      SW       Team Discharge Planning: Destination: PT-Home ,OT- Home , SLP-  Projected Follow-up: PT-Home health PT, OT-  Home health OT, SLP-  Projected Equipment Needs: PT-To be determined, OT- 3 in 1 bedside comode, To be determined, SLP-  Equipment Details: PT- , OT-  Patient/family involved in discharge planning: PT- Patient,  OT-Patient, SLP-   MD ELOS: 6-9 days. 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 She was admitted on 08/11/17 with ischemic right foot with gangrenous changes and underwent right femoral to below knee BG and right great toe amputation by Dr. Donnetta Hutching.  Post op WBAT. She has had some drainage from right thigh incisions  and reactive leucocytosis resolving. ABLA being monitored and continues on aranesp for  anemia of chronic disease.  Patient with resultant functional deficits with mobility, transfers, and self-care.  We will set goals for Mod I with PT/OT.  See Team Conference Notes for weekly updates to the plan of care

## 2017-08-16 NOTE — H&P (Signed)
Physical Medicine and Rehabilitation Admission H&P    CC: Functional deficits.   HPI:  Destiny Winthrop McAdoois a 67 y.o.femalewith history of ESRD- HD, diastolic congestive heart failure, diabetes mellitus, HTN, PVD s/p 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 History taken from chrat review and patient. She was admitted on 08/11/17 with ischemic right foot with gangrenous changes and underwent right femoral to below knee BG and right great toe amputation by Dr. Donnetta Hutching.  Post op WBAT. She has had some drainage from right thigh incisions  and reactive leucocytosis resolving. ABLA being monitored and continues on aranesp for anemia of chronic disease. Therapy ongoing and patient with functional deficits. CIR recommended for follow up therapy.   Review of Systems  HENT: Negative for hearing loss and tinnitus.   Eyes: Negative for blurred vision and double vision.  Respiratory: Negative for cough and shortness of breath.   Cardiovascular: Negative for chest pain and palpitations.  Gastrointestinal: Positive for constipation. Negative for abdominal pain and heartburn.  Musculoskeletal: Positive for joint pain and myalgias.  Skin: Negative for itching and rash.  Neurological: Positive for sensory change (LLE) and weakness. Negative for dizziness, speech change and headaches.  Psychiatric/Behavioral: Negative for memory loss. The patient does not have insomnia.   All other systems reviewed and are negative.     Past Medical History:  Diagnosis Date  . Abdominal bruit   . Anemia   . Anxiety   . Arthritis    Osteoarthritis  . Asthma   . Cervical disc disease    "pinced nerve"  . CHF (congestive heart failure) ([place])   . Complication of anesthesia    " after I got home from my last procedure, I started itching."  . Diabetes mellitus    Type II  . Diverticulitis   . ESRD on peritoneal dialysis [facility])    Hemodialysis - MWF- [place]   . GERD (gastroesophageal reflux disease)    from  medications  . GI bleed 03/31/2013  . History of hiatal hernia   . Hyperlipidemia   . Hypertension   . Neuropathy    left leg  . Osteoporosis   . Peripheral vascular disease ([place])   . Pneumonia    "very young"  . Seasonal allergies   . Shortness of breath dyspnea    WIth exertion  . Sleep apnea    can't afford cpap    Past Surgical History:  Procedure Laterality Date  . A/V SHUNTOGRAM N/A 09/22/2016   Procedure: A/V Shuntogram - left arm;  Surgeon: Serafina Mitchell, MD;  Location: [place] CV LAB;  Service: Cardiovascular;  Laterality: N/A;  . ABDOMINAL HYSTERECTOMY  1993`  . AMPUTATION Left 09/01/2016   Procedure: LEFT FOOT TRANSMETATARSAL AMPUTATION;  Surgeon: Newt Minion, MD;  Location: [place];  Service: Orthopedics;  Laterality: Left;  . AMPUTATION Right 08/11/2017   Procedure: RIGHT GREAT TOE AMPUTATION DIGIT;  Surgeon: Rosetta Posner, MD;  [facility] OR;  Service: Vascular;  Laterality: Right;  . AV FISTULA PLACEMENT Left 04/21/2016   Procedure: INSERTION OF ARTERIOVENOUS (AV) GORE-TEX GRAFT ARM LEFT;  Surgeon: Elam Dutch, MD;  Location: [place];  Service: Vascular;  Laterality: Left;  . [place] TRANSPOSITION Left 07/10/2014   Procedure: BASCILIC VEIN TRANSPOSITION;  Surgeon: Angelia Mould, MD;  Location: [place];  Service: Vascular;  Laterality: Left;  . BASCILIC VEIN TRANSPOSITION Right 11/08/2014   Procedure: FIRST STAGE BASILIC VEIN TRANSPOSITION;  Surgeon: Angelia Mould, MD;  [facility];  Service: Vascular;  Laterality: Right;  . BASCILIC VEIN TRANSPOSITION Right 01/18/2015   Procedure: SECOND STAGE BASILIC VEIN TRANSPOSITION;  Surgeon: Angelia Mould, MD;  Location: [facility] OR;  Service: Vascular;  Laterality: Right;  . ESOPHAGOGASTRODUODENOSCOPY N/A 03/31/2013   Procedure: ESOPHAGOGASTRODUODENOSCOPY (EGD);  Surgeon: Gatha Mayer, MD;  Location: [facility] ENDOSCOPY;  Service: Endoscopy;  Laterality: N/A;  . EYE SURGERY     laser surgery  .  FEMORAL-POPLITEAL BYPASS GRAFT Left 07/08/2016   Procedure: LEFT  FEMORAL-BELOW KNEE POPLITEAL ARTERY BYPASS GRAFT USING 6MM X 80 CM PROPATEN GORETEX GRAFT WITH RINGS.;  Surgeon: Rosetta Posner, MD;  Location: [place];  Service: Vascular;  Laterality: Left;  . FEMORAL-POPLITEAL BYPASS GRAFT Right 08/11/2017   Procedure: RIGHT FEMORAL TO BELOW KNEE POPLITEAL ARTERKY  BYPASS GRAFT USING 6MM RINGED PROPATEN GRAFT;  Surgeon: Rosetta Posner, MD;  Location: [place];  Service: Vascular;  Laterality: Right;  . FISTULOGRAM Left 10/29/2014   Procedure: FISTULOGRAM;  Surgeon: Angelia Mould, MD;  [facility] CATH LAB;  Service: Cardiovascular;  Laterality: Left;  . LIGATION OF ARTERIOVENOUS  FISTULA Left 04/21/2016   Procedure: LIGATION OF ARTERIOVENOUS  FISTULA LEFT ARM;  Surgeon: Elam Dutch, MD;  Location: [place];  Service: Vascular;  Laterality: Left;  . LOWER EXTREMITY ANGIOGRAPHY N/A 04/06/2017   Procedure: Lower Extremity Angiography - Right;  Surgeon: Serafina Mitchell, MD;  Location: [place] CV LAB;  Service: Cardiovascular;  Laterality: N/A;  . PATCH ANGIOPLASTY Right 01/18/2015   Procedure: BASILIC VEIN PATCH ANGIOPLASTY USING VASCUGUARD PATCH;  Surgeon: Angelia Mould, MD;  Location: [place];  Service: Vascular;  Laterality: Right;  . PERIPHERAL VASCULAR BALLOON ANGIOPLASTY  09/22/2016   Procedure: Peripheral Vascular Balloon Angioplasty;  Surgeon: Serafina Mitchell, MD;  Location: [place] [facility] LAB;  Service: Cardiovascular;;  Lt. Fistula  . PERIPHERAL VASCULAR CATHETERIZATION N/A 06/23/2016   Procedure: Abdominal Aortogram w/Lower Extremity;  Surgeon: Serafina Mitchell, MD;  Location: [place] CV LAB;  Service: Cardiovascular;  Laterality: N/A;  . PERIPHERAL VASCULAR CATHETERIZATION  06/23/2016   Procedure: Peripheral Vascular Intervention;  Surgeon: Serafina Mitchell, MD;  Location: [place] [facility] LAB;  Service: Cardiovascular;;  lt common and external illiac artery    Family History    Problem Relation Age of Onset  . Other Mother        not sure of cause of death  . Diabetes Father   . Pancreatic cancer Maternal Grandmother   . Colon cancer Neg Hx     Social History:  Married. Independent with walker PTA. She eports that she quit smoking about 5 years ago. Her smoking use included cigarettes. She has a 14.00 pack-year smoking history. she has never used smokeless tobacco. She reports that she does not drink alcohol or use drugs.    Allergies  Allergen Reactions  . Lisinopril Cough  . Prednisone Other (See Comments)    Excessive fluid buildup    Medications Prior to Admission  Medication Sig Dispense Refill  . acetaminophen (TYLENOL) 500 MG tablet Take 500 mg by mouth every 6 (six) hours as needed.    Marland Kitchen albuterol (PROVENTIL HFA;VENTOLIN HFA) 108 (90 BASE) MCG/ACT inhaler Inhale 1-2 puffs into the lungs every 6 (six) hours as needed for wheezing or shortness of breath.    Marland Kitchen amLODipine (NORVASC) 10 MG tablet Take 10 mg by mouth at bedtime.     Marland Kitchen aspirin 81 MG chewable tablet Chew 81 mg by  mouth every evening.     Marland Kitchen atorvastatin (LIPITOR) 20 MG tablet Take 20 mg by mouth at bedtime.     . budesonide-formoterol (SYMBICORT) 160-4.5 MCG/ACT inhaler Inhale 1 puff into the lungs daily as needed (for respiratory issues).     . calcium acetate (PHOSLO) 667 MG capsule Take 667-1,334 mg by mouth See admin instructions. 1 tablet twice daily with snacks. And 2 tablets 3 times daily with meals    . carvedilol (COREG) 12.5 MG tablet Take 12.5 mg by mouth 2 (two) times daily.     . cinacalcet (SENSIPAR) 30 MG tablet Take 30 mg by mouth daily with supper.     . clopidogrel (PLAVIX) 75 MG tablet TAKE 1 TABLET EVERY DAY 30 tablet 7  . diazepam (VALIUM) 5 MG tablet Take 5 mg by mouth every 12 (twelve) hours as needed for anxiety.    . ferric citrate (AURYXIA) 1 GM 210 MG(Fe) tablet Take 420 mg by mouth 3 (three) times daily with meals.    . fluticasone (FLONASE) 50 MCG/ACT nasal spray  Place 1 spray into both nostrils daily as needed for allergies or rhinitis.    Marland Kitchen gabapentin (NEURONTIN) 100 MG capsule Take 1 capsule (100 mg total) by mouth at bedtime. When necessary for neuropathy pain 30 capsule 3  . HYDROcodone-acetaminophen (NORCO) 5-325 MG tablet Take 1 tablet by mouth every 6 (six) hours as needed for moderate pain. 30 tablet 0  . insulin lispro (HUMALOG) 100 UNIT/ML injection Inject 0-8 Units into the skin 3 (three) times daily before meals. 0-59 = hypoglycemic protocol, 60-149 = 0 units, 150-250 = 5 units, 251-300 = 8 units, >301 notify MD/NP    . isosorbide dinitrate (ISORDIL) 20 MG tablet Take 1 tablet by mouth See admin instructions. Take 1 tablet daily on dialysis days and 1 tab twice daily on non dialysis days    . LEVEMIR FLEXTOUCH 100 UNIT/ML Pen Inject 10 Units into the skin at bedtime.     . multivitamin (RENA-VIT) TABS tablet Take 1 tablet by mouth daily.   5  . omeprazole (PRILOSEC) 20 MG capsule Take 20 mg by mouth daily.     . sucroferric oxyhydroxide (VELPHORO) 500 MG chewable tablet Chew 500-1,000 mg by mouth See admin instructions. Take 2 tablets (1000 mg) by mouth with meals and 1 tablet (500 mg) with snacks    . [DISCONTINUED] doxycycline (VIBRA-TABS) 100 MG tablet Take 1 tablet (100 mg total) by mouth 2 (two) times daily. 60 tablet 0  . hydrALAZINE (APRESOLINE) 25 MG tablet Take 1 tablet by mouth See admin instructions. Take 1 tab 3 times a day on dialysis days and 1 tab twice daily on non dialysis days    . loratadine (CLARITIN) 10 MG tablet Take 10 mg by mouth daily as needed for allergies.      Drug Regimen Review  Drug regimen was reviewed and remains appropriate with no significant issues identified  Home: Home Living Family/patient expects to be discharged to:: Inpatient rehab Living Arrangements: Spouse/significant other Available Help at Discharge: Family, Available 24 hours/day Type of Home: House Home Access: Stairs to enter [employer] of Steps: 2 Entrance Stairs-Rails: Left Home Layout: Multi-level, Bed/bath upstairs Alternate Level Stairs-Number of Steps: 6 and 6 split level Alternate Level Stairs-Rails: Left Bathroom Shower/Tub: Tub/shower unit, Curtain Bathroom Toilet: Standard Bathroom Accessibility: Yes Home Equipment: Environmental consultant - 4 wheels, Cane - single point, Civil engineer, contracting, Crutches  Lives With: Spouse   Functional History: Prior Function Level of  Independence: Independent with assistive device(s) Comments: walked with cane  Functional Status:  Mobility: Bed Mobility Overal bed mobility: Needs Assistance Bed Mobility: Supine to Sit Supine to sit: Mod assist, HOB elevated General bed mobility comments: assist for R LE to EOB, to raise trunk and position hips at EOB Transfers Overall transfer level: Needs assistance Equipment used: Rolling walker (2 wheeled) Transfers: Sit to/from Stand, [employer] Transfers Sit to Stand: Mod assist Stand pivot transfers: Min assist General transfer comment: assist to rise and gain balance, cues for hand and L foot placement. Bed to chair with small pivotal steps with walker. Unsteady requiring assist for balance. Ambulation/Gait General Gait Details: Not attempted more than bed to chair due to nausea and vomiting.    ADL: ADL Overall ADL's : Needs assistance/impaired Eating/Feeding: Independent, Sitting Grooming: Wash/dry face, Sitting, Set up Upper Body Bathing: Supervision/ safety, Sitting Lower Body Bathing: Maximal assistance, Sit to/from stand Upper Body Dressing : Set up, Sitting Lower Body Dressing: Total assistance, Sit to/from stand Toilet Transfer: Minimal assistance, Stand-pivot, RW Toilet Transfer Details (indicate cue type and reason): simulated to chair Toileting- Clothing Manipulation and Hygiene: Minimal assistance, Sit to/from stand General ADL Comments: Pt vomited upon reaching EOB, RN aware and gave  meds  Cognition: Cognition Overall Cognitive Status: Within Functional Limits for tasks assessed Orientation Level: Oriented X4 Cognition Arousal/Alertness: Awake/alert Behavior During Therapy: WFL for tasks assessed/performed Overall Cognitive Status: Within Functional Limits for tasks assessed   Blood pressure (!) 115/57, pulse 76, temperature 98.7 F (37.1 C), temperature source Oral, resp. rate 18, height 5' 4.5" (1.638 m), weight 68.2 kg (150 lb 5.7 oz), SpO2 98 %. Physical Exam  Nursing note and vitals reviewed. Constitutional: She is oriented to person, place, and time. She appears well-developed and well-nourished. No distress.  HENT:  Head: Normocephalic and atraumatic.  Mouth/Throat: Oropharynx is clear and moist.  Eyes: Conjunctivae and EOM are normal. Pupils are equal, round, and reactive to light.  Neck: Normal range of motion. Neck supple.  Cardiovascular: Regular rhythm. Tachycardia present.  Respiratory: Effort normal and breath sounds normal. No stridor.  GI: Soft. Bowel sounds are normal. She exhibits no distension. There is no tenderness.  Musculoskeletal:  Min edema right shin and right foot. Left TMA RLE 1st digit amputation  Neurological: She is alert and oriented to person, place, and time.  Motor: B/l UE 5/5 proximal to distal LLE: 4+/5 proximal to distal RLE: HF 4-/5, KE 4-/5, ankle dressed (pain inhibition)  Skin: Skin is warm and dry. She is not diaphoretic.  Dry compressive dressing right foot.  Right thigh incisions with moderate drainage on dressing.    Psychiatric: She has a normal mood and affect. Her behavior is normal. Judgment and thought content normal.    Results for orders placed or performed during the hospital encounter of 08/11/17 (from the past 48 hour(s))  Glucose, capillary     Status: Abnormal   Collection Time: 08/14/17  3:56 PM  Result Value Ref Range   Glucose-Capillary 185 (H) 65 - 99 mg/dL  Glucose, capillary     Status:  Abnormal   Collection Time: 08/14/17  9:30 PM  Result Value Ref Range   Glucose-Capillary 126 (H) 65 - 99 mg/dL   Comment 1 Notify RN   Glucose, capillary     Status: Abnormal   Collection Time: 08/15/17  6:05 AM  Result Value Ref Range   Glucose-Capillary 125 (H) 65 - 99 mg/dL   Comment 1 Notify RN  Glucose, capillary     Status: Abnormal   Collection Time: 08/15/17 11:28 AM  Result Value Ref Range   Glucose-Capillary 151 (H) 65 - 99 mg/dL  Glucose, capillary     Status: Abnormal   Collection Time: 08/15/17  4:10 PM  Result Value Ref Range   Glucose-Capillary 142 (H) 65 - 99 mg/dL  Glucose, capillary     Status: Abnormal   Collection Time: 08/15/17 10:04 PM  Result Value Ref Range   Glucose-Capillary 204 (H) 65 - 99 mg/dL   Comment 1 Notify RN    Comment 2 Document in Chart   Glucose, capillary     Status: Abnormal   Collection Time: 08/16/17  6:17 AM  Result Value Ref Range   Glucose-Capillary 105 (H) 65 - 99 mg/dL   Comment 1 Notify RN    Comment 2 Document in Chart   CBC     Status: Abnormal   Collection Time: 08/16/17  7:15 AM  Result Value Ref Range   WBC 12.0 (H) 4.0 - 10.5 K/uL   RBC 2.64 (L) 3.87 - 5.11 MIL/uL   Hemoglobin 8.2 (L) 12.0 - 15.0 g/dL   HCT 25.8 (L) 36.0 - 46.0 %   MCV 97.7 78.0 - 100.0 fL   MCH 31.1 26.0 - 34.0 pg   MCHC 31.8 30.0 - 36.0 g/dL   RDW 15.0 11.5 - 15.5 %   Platelets 273 150 - 400 K/uL  Renal function panel     Status: Abnormal   Collection Time: 08/16/17  7:15 AM  Result Value Ref Range   Sodium 131 (L) 135 - 145 mmol/L   Potassium 3.8 3.5 - 5.1 mmol/L   Chloride 92 (L) 101 - 111 mmol/L   CO2 26 22 - 32 mmol/L   Glucose, Bld 95 65 - 99 mg/dL   BUN 34 (H) 6 - 20 mg/dL   Creatinine, Ser 5.96 (H) 0.44 - 1.00 mg/dL    Comment: DELTA CHECK NOTED   Calcium 8.6 (L) 8.9 - 10.3 mg/dL   Phosphorus 4.3 2.5 - 4.6 mg/dL   Albumin 2.6 (L) 3.5 - 5.0 g/dL   GFR calc non Af Amer 7 (L) >60 mL/min   GFR calc Af Amer 8 (L) >60 mL/min     Comment: (NOTE) The eGFR has been calculated using the CKD EPI equation. This calculation has not been validated in all clinical situations. eGFR's persistently <60 mL/min signify possible Chronic Kidney Disease.    Anion gap 13 5 - 15   No results found.     Medical Problem List and Plan: 1.  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 2.  DVT Prophylaxis/Anticoagulation: Pharmaceutical: Heparin 3. Pain Management: Hydrocodone prn effective 4. Mood: LCSW to follow for evaluation and support when appropriate 5. Neuropsych: This patient is capable of making decisions on her own behalf. 6. Skin/Wound Care: Routine pressure relief measures. Nephro for supplement to help promote healing. Monitor drainage right thigh.  7. Fluids/Electrolytes/Nutrition: Strict I/O with daily weights. Renal/CM diet with 1200 FR. 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  8. T2DM: Monitor BS ac/hs. Continue Levemir daily. 9. ESRD: Continue HD MWF --schedule at end of day to help with activity tolerance and therapy. 10. Asthma: Respiratory status stable on Dulera bid.  11. PAD s/p FPBG with right great toe amputation: Monitor wounds daily. Monitor drainage from proximal incisions. 12. Anxiety disorder: Managed with diazepam bid prn. 13. Diastolic CHF/HTN:  Monitor BP bid. Continue Isordil bid. On lipitor at bedtime. Hydralazine on hold.  14. Acute on chronic anemia: On aranesp MWF with HD.      Post Admission Physician Evaluation: 1. Preadmission assessment reviewed and changes made below. 2. Functional deficits secondary  to RLE 1st digit amputation with history of left TMA. 3. Patient is admitted to receive collaborative, interdisciplinary care between the physiatrist, rehab nursing staff, and therapy team. 4. Patient's level of medical complexity and substantial therapy needs in context of that medical necessity cannot be provided at a lesser  intensity of care such as a SNF. 5. Patient has experienced substantial functional loss from his/her baseline which was documented above under the "Functional History" and "Functional Status" headings.  Judging by the patient's diagnosis, physical exam, and functional history, the patient has potential for functional progress which will result in measurable gains while on inpatient rehab.  These gains will be of substantial and practical use upon discharge  in facilitating mobility and self-care at the household level. 59. Physiatrist will provide 24 hour management of medical needs as well as oversight of the therapy plan/treatment and provide guidance as appropriate regarding the interaction of the two. 7. 24 hour rehab nursing will assist with safety, skin/wound care, disease management, pain management and patient education  and help integrate therapy concepts, techniques,education, etc. 8. PT will assess and treat for/with: Lower extremity strength, range of motion, stamina, balance, functional mobility, safety, adaptive techniques and equipment, woundcare, coping skills, pain control, education.   Goals are: Mod I. 9. OT will assess and treat for/with: ADL's, functional mobility, safety, upper extremity strength, adaptive techniques and equipment, wound mgt, ego support, and community reintegration.   Goals are: Mod I. Therapy may not proceed with showering this patient. 10. Case Management and Social Worker will assess and treat for psychological issues and discharge planning. 11. Team conference will be held weekly to assess progress toward goals and to determine barriers to discharge. 12. Patient will receive at least 3 hours of therapy per day at least 5 days per week. 13. ELOS: 7-11 days.       14. Prognosis:  good   Delice Lesch, MD, ABPMR Bary Leriche, PA-C 08/16/2017

## 2017-08-16 NOTE — Care Management Note (Addendum)
Case Management Note Marvetta Gibbons RN, BSN Unit 4E-Case Manager [phone]  Patient Details  Name: Destiny Day MRN: [id number] Date of Birth: Sep 05, 1950  Subjective/Objective:  Pt admitted with PVD, gangrene of right LE s/p right great toe amputation and right fempop Bypass graft                 Action/Plan: PTA pt lived at home with spouse- Per PT eval CIR consulted for possible admission- CSW consulted for possible SNF as back up however- pt prefers CIR vs Home- CM to follow for transition of care needs.-Notified by Tiffany with [facility] of pre-op referral for any HH needs- CIR to follow up on Monday for possible admission  Expected Discharge Date:  08/16/17               Expected Discharge Plan:  [place]  In-House Referral:  Clinical Social Work  Discharge planning Services  CM Consult  Post Acute Care Choice:  Home Health Choice offered to:  Patient  DME Arranged:    DME Agency:     HH Arranged:    [place]:  [facility]  Status of Service:  Completed, signed off  If discussed at [employer] of Stay Meetings, dates discussed:    Discharge Disposition: IP rehab- (CIR)   Additional Comments:  08/16/17- 1245- Marvetta Gibbons RN, CM- per Pamala Hurry with CIR- bed available today- and plan to admit pt to IP rehab later today.- Notified Tiffany with [facility] of d/c plan   Dawayne Patricia, RN 08/16/2017, 12:45 PM

## 2017-08-16 NOTE — Discharge Summary (Signed)
Physician Discharge Summary   Patient ID: Destiny Day [id number] y.o. 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 Hust is a 67 year old female who came in as an outpatient for right femoral to below-knee popliteal bypass with PTFE conduit as well as right great toe amputation by Dr. Donnetta Hutching on 08/11/17.  She tolerated the procedure well and was admitted to the hospital to monitor circulatory status, for pain management, and for wound care.  Nephrology was consulted during hospitalization for management of end-stage renal disease on hemodialysis.  Postoperatively wound care consisted of daily dressing changes to right great toe amputation site.  Postoperative shoe for heel weightbearing has been ordered and tolerated well.  Over the course of hospital stay right great toe is healing well without sign of infection or further tissue changes.  Patient's activity and mobility progressed slowly postoperatively and thus inpatient rehab consult was made.  Inpatient rehab has accepted the patient and patient is in agreement for inpatient rehab admission.  She will be asked to resume her plavix and statin regimen.  She will Follow-up in office after discharge from inpatient rehab.  Discharge instructions were reviewed with the patient and she voices her understanding.  She will be discharged today patient rehab in stable condition.  Consults: nephrology  Treatments: surgery by Dr. Donnetta Hutching on 08/11/17: #1 right femoral to below-knee popliteal bypass with 6 mm ring propatent Gore-Tex graft, #2 right great toe  amputation    Discharge Exam: See progress note 08/16/17 Vitals:   08/16/17 1030 08/16/17 1100  BP: (!) 167/43 (!) 115/57  Pulse: 90 76  Resp: 19 18  Temp:    SpO2:      Disposition: Inpatient rehab  Patient Instructions:  Allergies as of 08/16/2017      Reactions   Lisinopril Cough   Prednisone Other (See Comments)   Excessive fluid buildup      Medication List    TAKE these medications   acetaminophen 500 MG tablet Commonly known as:  TYLENOL Take 500 mg by mouth every 6 (six) hours as needed.   albuterol 108 (90 Base) MCG/ACT inhaler Commonly known as:  PROVENTIL HFA;VENTOLIN HFA Inhale 1-2 puffs into the lungs every 6 (six) hours as needed for wheezing or shortness of breath.   amLODipine 10 MG tablet Commonly known as:  NORVASC Take 10 mg by mouth at bedtime.   aspirin 81 MG chewable tablet Chew 81 mg by mouth every evening.   atorvastatin 20 MG tablet Commonly known as:  LIPITOR Take 20 mg by mouth at bedtime.   AURYXIA 1 GM 210 MG(Fe) tablet Generic drug:  ferric citrate Take 420 mg by mouth 3 (three) times daily with meals.   budesonide-formoterol 160-4.5 MCG/ACT inhaler Commonly known as:  SYMBICORT Inhale 1 puff into the lungs daily as needed (for respiratory issues).   calcium acetate 667 MG capsule Commonly known as:  PHOSLO Take 667-1,334 mg by mouth See admin instructions. 1 tablet twice daily with snacks. And 2 tablets 3 times daily with meals   carvedilol 12.5 MG tablet Commonly known as:  COREG Take 12.5 mg by mouth 2 (two) times daily.   cinacalcet  30 MG tablet Commonly known as:  SENSIPAR Take 30 mg by mouth daily with supper.   clopidogrel 75 MG tablet Commonly known as:  PLAVIX TAKE 1 TABLET EVERY DAY   diazepam 5 MG tablet Commonly known as:  VALIUM Take 5 mg by mouth every 12 (twelve) hours as needed for anxiety.   fluticasone 50 MCG/ACT nasal spray Commonly known as:  FLONASE Place 1 spray into both nostrils daily as  needed for allergies or rhinitis.   gabapentin 100 MG capsule Commonly known as:  NEURONTIN Take 1 capsule (100 mg total) by mouth at bedtime. When necessary for neuropathy pain   HUMALOG 100 UNIT/ML injection Generic drug:  insulin lispro Inject 0-8 Units into the skin 3 (three) times daily before meals. 0-59 = hypoglycemic protocol, 60-149 = 0 units, 150-250 = 5 units, 251-300 = 8 units, >301 notify MD/NP   hydrALAZINE 25 MG tablet Commonly known as:  APRESOLINE Take 1 tablet by mouth See admin instructions. Take 1 tab 3 times a day on dialysis days and 1 tab twice daily on non dialysis days   HYDROcodone-acetaminophen 5-325 MG tablet Commonly known as:  NORCO Take 1 tablet by mouth every 6 (six) hours as needed for moderate pain.   isosorbide dinitrate 20 MG tablet Commonly known as:  ISORDIL Take 1 tablet by mouth See admin instructions. Take 1 tablet daily on dialysis days and 1 tab twice daily on non dialysis days   LEVEMIR FLEXTOUCH 100 UNIT/ML Pen Generic drug:  Insulin Detemir Inject 10 Units into the skin at bedtime.   loratadine 10 MG tablet Commonly known as:  CLARITIN Take 10 mg by mouth daily as needed for allergies.   multivitamin Tabs tablet Take 1 tablet by mouth daily.   omeprazole 20 MG capsule Commonly known as:  PRILOSEC Take 20 mg by mouth daily.   VELPHORO 500 MG chewable tablet Generic drug:  sucroferric oxyhydroxide Chew 500-1,000 mg by mouth See admin instructions. Take 2 tablets (1000 mg) by mouth with meals and 1 tablet (500 mg) with snacks      Activity: activity as tolerated Diet: diabetic diet Wound Care: keep wound clean and dry  Follow-up with Dr. Donnetta Hutching in 4 weeks.  SignedDagoberto Ligas 08/16/2017 11:20 AM

## 2017-08-16 NOTE — Discharge Instructions (Signed)
 [facility]  Discharge instructions  Lower Extremity Bypass Surgery  Please refer to the following instruction for your post-procedure care. Your surgeon or physician assistant will discuss any changes with you.  Activity  You are encouraged to walk as much as you can. You can slowly return to normal activities during the month after your surgery. Avoid strenuous activity and heavy lifting until your doctor tells you it's OK. Avoid activities such as vacuuming or swinging a golf club. Do not drive until your doctor give the OK and you are no longer taking prescription pain medications. It is also normal to have difficulty with sleep habits, eating and bowel movement after surgery. These will go away with time.  Bathing/Showering  You may shower after you go home. Do not soak in a bathtub, hot tub, or swim until the incision heals completely.  Incision Care  Clean your incision with mild soap and water. Shower every day. Pat the area dry with a clean towel. You do not need a bandage unless otherwise instructed. Do not apply any ointments or creams to your incision. If you have open wounds you will be instructed how to care for them or a visiting nurse may be arranged for you. If you have staples or sutures along your incision they will be removed at your post-op appointment. You may have skin glue on your incision. Do not peel it off. It will come off on its own in about one week. If you have a great deal of moisture in your groin, use a gauze help keep this area dry.  Diet  Resume your normal diet. There are no special food restrictions following this procedure. A low fat/ low cholesterol diet is recommended for all patients with vascular disease. In order to heal from your surgery, it is CRITICAL to get adequate nutrition. Your body requires vitamins, minerals, and protein. Vegetables are the best source of vitamins and minerals. Vegetables also provide the  perfect balance of protein. Processed food has little nutritional value, so try to avoid this.  Medications  Resume taking all your medications unless your doctor or nurse practitioner tells you not to. If your incision is causing pain, you may take over-the-counter pain relievers such as acetaminophen (Tylenol). If you were prescribed a stronger pain medication, please aware these medication can cause nausea and constipation. Prevent nausea by taking the medication with a snack or meal. Avoid constipation by drinking plenty of fluids and eating foods with high amount of fiber, such as fruits, vegetables, and grains. Take Colase 100 mg (an over-the-counter stool softener) twice a day as needed for constipation. Do not take Tylenol if you are taking prescription pain medications.  Follow Up  Our office will schedule a follow up appointment 2-3 weeks following discharge.  Please call us immediately for any of the following conditions  Severe or worsening pain in your legs or feet while at rest or while walking Increase pain, redness, warmth, or drainage (pus) from your incision site(s) Fever of 101 degree or higher The swelling in your leg with the bypass suddenly worsens and becomes more painful than when you were in the hospital If you have been instructed to feel your graft pulse then you should do so every day. If you can no longer feel this pulse, call the office immediately. Not all patients are given this instruction.  Leg swelling is common after leg bypass surgery.  The swelling should improve over a few months   following surgery. To improve the swelling, you may elevate your legs above the level of your heart while you are sitting or resting. Your surgeon or physician assistant may ask you to apply an ACE wrap or wear compression (TED) stockings to help to reduce swelling.  Reduce your risk of vascular disease  Stop smoking. If you would like help call [facility] at 1-800-QUIT-NOW  (1-800-784-8669) or [place] at 336-586-4000.  Manage your cholesterol Maintain a desired weight Control your diabetes weight Control your diabetes Keep your blood pressure down  If you have any questions, please call the office at [phone]   

## 2017-08-16 NOTE — Progress Notes (Signed)
Pt discharged to inpatient rehab. Telemetry box removed. Report given to [place], RN on [place]. All questions answered. Pt transported via bed and was accompanied by this RN. Pt's family at bedside.    Grant Fontana BSN, RN

## 2017-08-16 NOTE — Telephone Encounter (Signed)
Sched appt 09/14/17 at 8:30. Cxl'd old 08/24/17 appt. Lm on cell# to inform pt of new appt.

## 2017-08-17 ENCOUNTER — Inpatient Hospital Stay (HOSPITAL_COMMUNITY): Payer: Medicare Other | Admitting: Occupational Therapy

## 2017-08-17 ENCOUNTER — Inpatient Hospital Stay (HOSPITAL_COMMUNITY): Payer: Medicare Other | Admitting: Physical Therapy

## 2017-08-17 DIAGNOSIS — Z992 Dependence on renal dialysis: Secondary | ICD-10-CM

## 2017-08-17 DIAGNOSIS — Z794 Long term (current) use of insulin: Secondary | ICD-10-CM

## 2017-08-17 DIAGNOSIS — E1152 Type 2 diabetes mellitus with diabetic peripheral angiopathy with gangrene: Secondary | ICD-10-CM

## 2017-08-17 DIAGNOSIS — F411 Generalized anxiety disorder: Secondary | ICD-10-CM

## 2017-08-17 DIAGNOSIS — N186 End stage renal disease: Secondary | ICD-10-CM

## 2017-08-17 DIAGNOSIS — R5381 Other malaise: Principal | ICD-10-CM

## 2017-08-17 DIAGNOSIS — I5032 Chronic diastolic (congestive) heart failure: Secondary | ICD-10-CM

## 2017-08-17 LAB — GLUCOSE, CAPILLARY
Glucose-Capillary: 172 mg/dL — ABNORMAL HIGH (ref 65–99)
Glucose-Capillary: 156 mg/dL — ABNORMAL HIGH (ref 65–99)
Glucose-Capillary: 152 mg/dL — ABNORMAL HIGH (ref 65–99)
Glucose-Capillary: 228 mg/dL — ABNORMAL HIGH (ref 65–99)

## 2017-08-17 MED ORDER — SENNOSIDES-DOCUSATE SODIUM 8.6-50 MG PO TABS
2.0000 | ORAL_TABLET | Freq: Every day | ORAL | Status: DC
  Administered 2017-08-17 – 2017-08-22 (×6): 2 via ORAL
  Filled 2017-08-17 (×6): qty 2

## 2017-08-17 MED ORDER — SORBITOL 70 % SOLN
45.0000 mL | Freq: Two times a day (BID) | Status: DC | PRN
  Administered 2017-08-17: 45 mL via ORAL
  Filled 2017-08-17: qty 60

## 2017-08-17 MED ORDER — GLYCERIN (LAXATIVE) 2.1 G RE SUPP
1.0000 | Freq: Every day | RECTAL | Status: DC | PRN
  Filled 2017-08-17: qty 1

## 2017-08-17 MED ORDER — POLYETHYLENE GLYCOL 3350 17 G PO PACK
17.0000 g | PACK | Freq: Two times a day (BID) | ORAL | Status: DC
  Administered 2017-08-17 – 2017-08-22 (×9): 17 g via ORAL
  Filled 2017-08-17 (×12): qty 1

## 2017-08-17 MED ORDER — SORBITOL 70 % SOLN: 30.0000 mL | Freq: Two times a day (BID) | Status: DC | PRN

## 2017-08-17 NOTE — Progress Notes (Signed)
Patient information reviewed and entered into eRehab system by Blaire Palomino, RN, CRRN, PPS Coordinator.  Information including medical coding and functional independence measure will be reviewed and updated through discharge.     Per nursing patient was given "Data Collection Information Summary for Patients in Inpatient Rehabilitation Facilities with attached "Privacy Act Statement-Health Care Records" upon admission.  

## 2017-08-17 NOTE — Progress Notes (Signed)
Occupational Therapy Session Note  Patient Details  Name: Destiny Day MRN: [id number] Date of Birth: 25-May-1951  Today's Date: 08/17/2017 OT Individual Time: 1130-1200 OT Individual Time Calculation (min): 30 min      Short Term Goals: Week 1:  OT Short Term Goal 1 (Week 1): STGs = LTGs  Skilled Therapeutic Interventions/Progress Updates:  Balance/vestibular training;Discharge planning;Functional mobility training;DME/adaptive equipment instruction;Patient/family education;Self Care/advanced ADL retraining;Therapeutic Activities;Therapeutic Exercise;UE/LE Strength taining/ROM   1:1 Pt requested to wash right LE and redress dressings. Performed bathing LE with setup and sit to stand to adjust clothing. Education on skin integrity and caring for LE.  Pt transferred into recliner with mod A with stand pivot. Encouraged LE to be elevated for edema control.  Therapy Documentation Precautions:  Precautions Precautions: Fall Other Brace/Splint: Darco shoe on Rt Restrictions Weight Bearing Restrictions: Yes RLE Weight Bearing: Weight bearing as tolerated Pain: No c/o in session  ADL: ADL ADL Comments: refer to functional navigator  See Function Navigator for Current Functional Status.   Therapy/Group: Individual Therapy  Willeen Cass [facility] 08/17/2017, 3:33 PM

## 2017-08-17 NOTE — Evaluation (Signed)
Physical Therapy Assessment and Plan  Patient Details  Name: Destiny Day MRN: [id number] Date of Birth: [birth year]  PT Diagnosis: Abnormality of gait, Difficulty walking, Impaired sensation and Muscle weakness Rehab Potential: Good ELOS: 7-10 days   Today's Date: 08/17/2017 PT Individual Time: 1300-1409 PT Individual Time Calculation (min): 69 min    Problem List:  Patient Active Problem List   Diagnosis Date Noted  . Generalized anxiety disorder   . Debility 08/16/2017  . Amputated great toe of right foot ([place])   . Amputated toe of left foot ([place])   . Post-operative pain   . ESRD on dialysis ([place])   . Diabetes mellitus ([place])   . Anxiety state   . Benign essential HTN   . Acute blood loss anemia   . Anemia of chronic disease   . Idiopathic chronic venous hypertension of both lower extremities with inflammation 10/13/2016  . S/P transmetatarsal amputation of foot, left ([place]) 09/21/2016  . Onychomycosis 08/15/2016  . Gangrene of toe of left foot ([place])   . PAD (peripheral artery disease) ([place]) 07/08/2016  . Atherosclerosis of native artery of left lower extremity with gangrene ([place]) 06/23/2016  . GERD (gastroesophageal reflux disease) 10/07/2015  . ARF (acute renal failure) ([place])   . Hypothermia 06/12/2015  . Acute on chronic renal failure ([place]) 06/12/2015  . CHF (congestive heart failure) ([place]) 07/30/2014  . CKD (chronic kidney disease) stage 4, GFR 15-29 ml/min (HCC) 06/27/2014  . Hypertensive heart disease 05/25/2013  . CKD (chronic kidney disease) stage 3, GFR 30-59 ml/min (HCC) 05/25/2013  . Pulmonary edema 05/23/2013  . Type 2 diabetes mellitus with diabetic nephropathy ([place]) 05/23/2013  . Type 2 diabetes mellitus with hyperosmolar nonketotic hyperglycemia ([place]) 04/13/2013  . Chronic diastolic CHF (congestive heart failure) ([place]) 04/13/2013  . UGI bleed 03/31/2013  . Hypokalemia 08/24/2012  . Type II or unspecified type diabetes mellitus without mention of  complication, not stated as uncontrolled 12/27/2007  . HLD (hyperlipidemia) 12/27/2007  . ALLERGIC RHINITIS 12/27/2007  . Asthma 12/27/2007    Past Medical History:  Past Medical History:  Diagnosis Date  . Abdominal bruit   . Anemia   . Anxiety   . Arthritis    Osteoarthritis  . Asthma   . Cervical disc disease    "pinced nerve"  . CHF (congestive heart failure) ([place])   . Complication of anesthesia    " after I got home from my last procedure, I started itching."  . Diabetes mellitus    Type II  . Diverticulitis   . ESRD on peritoneal dialysis [facility])    Hemodialysis - MWF- [place]   . GERD (gastroesophageal reflux disease)    from medications  . GI bleed 03/31/2013  . History of hiatal hernia   . Hyperlipidemia   . Hypertension   . Neuropathy    left leg  . Osteoporosis   . Peripheral vascular disease ([place])   . Pneumonia    "very young"  . Seasonal allergies   . Shortness of breath dyspnea    WIth exertion  . Sleep apnea    can't afford cpap   Past Surgical History:  Past Surgical History:  Procedure Laterality Date  . A/V SHUNTOGRAM N/A 09/22/2016   Procedure: A/V Shuntogram - left arm;  Surgeon: Serafina Mitchell, MD;  Location: [place] CV LAB;  Service: Cardiovascular;  Laterality: N/A;  . ABDOMINAL HYSTERECTOMY  1993`  . AMPUTATION Left 09/01/2016   Procedure: LEFT FOOT TRANSMETATARSAL AMPUTATION;  Surgeon:  Newt Minion, MD;  Location: [place];  Service: Orthopedics;  Laterality: Left;  . AMPUTATION Right 08/11/2017   Procedure: RIGHT GREAT TOE AMPUTATION DIGIT;  Surgeon: Rosetta Posner, MD;  [facility] OR;  Service: Vascular;  Laterality: Right;  . AV FISTULA PLACEMENT Left 04/21/2016   Procedure: INSERTION OF ARTERIOVENOUS (AV) GORE-TEX GRAFT ARM LEFT;  Surgeon: Elam Dutch, MD;  Location: [place];  Service: Vascular;  Laterality: Left;  . [place] TRANSPOSITION Left 07/10/2014   Procedure: BASCILIC VEIN TRANSPOSITION;  Surgeon: Angelia Mould,  MD;  Location: [place];  Service: Vascular;  Laterality: Left;  . BASCILIC VEIN TRANSPOSITION Right 11/08/2014   Procedure: FIRST STAGE BASILIC VEIN TRANSPOSITION;  Surgeon: Angelia Mould, MD;  Location: [place];  Service: Vascular;  Laterality: Right;  . BASCILIC VEIN TRANSPOSITION Right 01/18/2015   Procedure: SECOND STAGE BASILIC VEIN TRANSPOSITION;  Surgeon: Angelia Mould, MD;  Location[facility];  Service: Vascular;  Laterality: Right;  . ESOPHAGOGASTRODUODENOSCOPY N/A 03/31/2013   Procedure: ESOPHAGOGASTRODUODENOSCOPY (EGD);  Surgeon: Gatha Mayer, MD;  Location[facility] ENDOSCOPY;  Service: Endoscopy;  Laterality: N/A;  . EYE SURGERY     laser surgery  . FEMORAL-POPLITEAL BYPASS GRAFT Left 07/08/2016   Procedure: LEFT  FEMORAL-BELOW KNEE POPLITEAL ARTERY BYPASS GRAFT USING 6MM X 80 CM PROPATEN GORETEX GRAFT WITH RINGS.;  Surgeon: Rosetta Posner, MD;  Location: [place];  Service: Vascular;  Laterality: Left;  . FEMORAL-POPLITEAL BYPASS GRAFT Right 08/11/2017   Procedure: RIGHT FEMORAL TO BELOW KNEE POPLITEAL ARTERKY  BYPASS GRAFT USING 6MM RINGED PROPATEN GRAFT;  Surgeon: Rosetta Posner, MD;  Location: [place];  Service: Vascular;  Laterality: Right;  . FISTULOGRAM Left 10/29/2014   Procedure: FISTULOGRAM;  Surgeon: Angelia Mould, MD;  Location: [facility] CATH LAB;  Service: Cardiovascular;  Laterality: Left;  . LIGATION OF ARTERIOVENOUS  FISTULA Left 04/21/2016   Procedure: LIGATION OF ARTERIOVENOUS  FISTULA LEFT ARM;  Surgeon: Elam Dutch, MD;  Location: [place];  Service: Vascular;  Laterality: Left;  . LOWER EXTREMITY ANGIOGRAPHY N/A 04/06/2017   Procedure: Lower Extremity Angiography - Right;  Surgeon: Serafina Mitchell, MD;  Location: [place] [facility];  Service: Cardiovascular;  Laterality: N/A;  . PATCH ANGIOPLASTY Right 01/18/2015   Procedure: BASILIC VEIN PATCH ANGIOPLASTY USING VASCUGUARD PATCH;  Surgeon: Angelia Mould, MD;  Location: [place];  Service: Vascular;  Laterality:  Right;  . PERIPHERAL VASCULAR BALLOON ANGIOPLASTY  09/22/2016   Procedure: Peripheral Vascular Balloon Angioplasty;  Surgeon: Serafina Mitchell, MD;  Location: [place] CV LAB;  Service: Cardiovascular;;  Lt. Fistula  . PERIPHERAL VASCULAR CATHETERIZATION N/A 06/23/2016   Procedure: Abdominal Aortogram w/Lower Extremity;  Surgeon: Serafina Mitchell, MD;  Location: [place] [facility] LAB;  Service: Cardiovascular;  Laterality: N/A;  . PERIPHERAL VASCULAR CATHETERIZATION  06/23/2016   Procedure: Peripheral Vascular Intervention;  Surgeon: Serafina Mitchell, MD;  Location: [place] CV LAB;  Service: Cardiovascular;;  lt common and external illiac artery    Assessment & Plan Clinical Impression: Patient is a 67 y.o. year old female with recent admission to the hospital on 08/11/17 with ischemic right foot with gangrenous changes and underwent right femoral to below knee BG and right great toe amputation.  Patient transferred to CIR on 08/16/2017 .   Patient currently requires mod with mobility secondary to muscle weakness and decreased standing balance and decreased balance strategies.  Prior to hospitalization, patient was modified independent  with mobility  and lived with Spouse, Other (Comment)(grandson) in a House home.  Home access is 2Stairs to enter.  Patient will benefit from skilled PT intervention to maximize safe functional mobility, minimize fall risk and decrease caregiver burden for planned discharge home with intermittent assist.  Anticipate patient will benefit from follow up [facility] at discharge.  PT - End of Session Activity Tolerance: Tolerates 30+ min activity with multiple rests Endurance Deficit: Yes Endurance Deficit Description: limited by pain PT Assessment Rehab Potential (ACUTE/IP ONLY): Good PT Barriers to Discharge: Inaccessible home environment PT Barriers to Discharge Comments: split level home PT Patient demonstrates impairments in the following area(s):  Balance;Endurance;Edema;Motor;Pain PT Transfers Functional Problem(s): Bed Mobility;Bed to Chair;Car;Furniture PT Locomotion Functional Problem(s): Ambulation;Wheelchair Mobility;Stairs PT Plan PT Intensity: Minimum of 1-2 x/day ,45 to 90 minutes PT Frequency: 5 out of 7 days PT Duration Estimated Length of Stay: 7-10 days PT Treatment/Interventions: Ambulation/gait training;Community reintegration;DME/adaptive equipment instruction;Neuromuscular re-education;Stair training;UE/LE Strength taining/ROM;Wheelchair propulsion/positioning;UE/LE Coordination activities;Therapeutic Activities;Pain management;Discharge planning;Balance/vestibular training;Functional mobility training;Patient/family education;Splinting/orthotics;Therapeutic Exercise PT Transfers Anticipated Outcome(s): mod I PT Locomotion Anticipated Outcome(s): mod I PT Recommendation Follow Up Recommendations: Home health PT Patient destination: Home Equipment Recommended: To be determined  Skilled Therapeutic Intervention Pt participated in skilled PT eval and was educated on PT POC and goals.  Pt performs transfers with min/mod A from various surfaces during eval.  Gait initially 10', then up to 55' with RW with close supervision/min guard.  Stair negotiation x 4 stairs with 1 railing with min A to ascend stairs, mod A to descend.  Simulated car transfer with min A.  nustep for LE strengthening and AAROM x 6 minutes level 3.  Pt given HEP of LE therex and discussed with pt, next PT session pt to perform HEP. Pt states she is in 8/10 pain during session but does not want to take pain meds at this time. Pt left in room with needs at hand.  PT Evaluation Precautions/Restrictions Precautions Precautions: Fall Other Brace/Splint: Darco shoe on Rt Restrictions RLE Weight Bearing: Weight bearing as tolerated Pain Pain Assessment Pain Assessment: 0-10 Pain Score: 4  Faces Pain Scale: Hurts even more Pain Type: Surgical pain Pain  Location: Leg Pain Orientation: Right Pain Descriptors / Indicators: Aching;Burning Pain Onset: With Activity Pain Intervention(s): Repositioned;Rest Home Living/Prior Functioning Home Living Available Help at Discharge: Family;Available 24 hours/day Type of Home: House Home Access: Stairs to enter [employer] of Steps: 2 Entrance Stairs-Rails: Left Home Layout: Multi-level;Bed/bath upstairs Alternate Level Stairs-Number of Steps: 6 and 6 split level Alternate Level Stairs-Rails: Left Bathroom Shower/Tub: Tub/shower unit;Curtain Bathroom Toilet: Standard Bathroom Accessibility: Yes Additional Comments: 2 rollators at home, walker, SPC  Lives With: Spouse;Other (Comment)(grandson) Prior Function Level of Independence: Requires assistive device for independence  Able to Take Stairs?: Yes Driving: Yes Vocation: Retired Comments: walked with cane  Cognition Overall Cognitive Status: Within Functional Limits for tasks assessed Arousal/Alertness: Awake/alert Orientation Level: Oriented X4 Memory: Appears intact Sensation Sensation Light Touch: Impaired Detail Light Touch Impaired Details: Impaired LLE(neuropathy) Stereognosis: Appears Intact Hot/Cold: Appears Intact Proprioception: Appears Intact Additional Comments: L foot neuropathy Coordination Gross Motor Movements are Fluid and Coordinated: Yes Fine Motor Movements are Fluid and Coordinated: Yes Motor  Motor Motor - Skilled Clinical Observations: generalized motor weakness   Trunk/Postural Assessment  Cervical Assessment Cervical Assessment: Within Functional Limits Thoracic Assessment Thoracic Assessment: Within Functional Limits Lumbar Assessment Lumbar Assessment: Within Functional Limits Postural Control Postural Control: (balance reactions delayed due to Rt LE pain)  Balance Static Standing  Balance Static Standing - Level of Assistance: 4: Min assist Dynamic Standing Balance Dynamic Standing  - Level of Assistance: 3: Mod assist Extremity Assessment  RUE Assessment RUE Assessment: Exceptions to WFL(limited shoulder AROM due osteoarthritis, limited finger extension due to OA) LUE Assessment LUE Assessment: Exceptions to WFL(limited shoulder AROM due to OA) RLE Assessment RLE Assessment: (grossly 3/5) LLE Assessment LLE Assessment: (grossly 4/5)   See Function Navigator for Current Functional Status.   Refer to Care Plan for Long Term Goals  Recommendations for other services: None   Discharge Criteria: Patient will be discharged from PT if patient refuses treatment 3 consecutive times without medical reason, if treatment goals not met, if there is a change in medical status, if patient makes no progress towards goals or if patient is discharged from hospital.  The above assessment, treatment plan, treatment alternatives and goals were discussed and mutually agreed upon: by patient  Calleigh Lafontant 08/17/2017, 2:10 PM

## 2017-08-17 NOTE — Progress Notes (Signed)
Destiny Day PHYSICAL MEDICINE & REHABILITATION     PROGRESS NOTE  Subjective/Complaints:  Pt seen lying in bed this AM.  Destiny Day slept well overnight.  He has questions about what to expect on her first day of therapies.  ROS: Denies CP, SOB, N/V/D.  Objective: Vital Signs: Blood pressure (!) 125/53, pulse 87, temperature 98.6 F (37 C), temperature source Oral, resp. rate 16, height 5\' 5"  (1.651 m), weight 69.4 kg (152 lb 16 oz), SpO2 100 %. No results found. Recent Labs    08/14/17 1046 08/16/17 0715  WBC 14.9* 12.0*  HGB 9.1* 8.2*  HCT 27.8* 25.8*  PLT 296 273   Recent Labs    08/16/17 0715  NA 131*  K 3.8  CL 92*  GLUCOSE 95  BUN 34*  CREATININE 5.96*  CALCIUM 8.6*   CBG (last 3)  Recent Labs    08/16/17 1626 08/16/17 2109 08/17/17 0637  GLUCAP 180* 217* 172*    Wt Readings from Last 3 Encounters:  08/17/17 69.4 kg (152 lb 16 oz)  08/16/17 66 kg (145 lb 8.1 oz)  05/25/17 68.5 kg (151 lb)    Physical Exam:  BP (!) 125/53 (BP Location: Right Arm)   Pulse 87   Temp 98.6 F (37 C) (Oral)   Resp 16   Ht 5\' 5"  (1.651 m)   Wt 69.4 kg (152 lb 16 oz)   SpO2 100%   BMI 25.46 kg/m  Constitutional: Destiny Day appears well-developed and well-nourished. No distress.  HENT: Normocephalic and atraumatic.  Eyes: EOM are normal. No discharge.  Cardiovascular: Regular rate and rhythm.  Respiratory: Effort normal and breath sounds normal.  GI: Bowel sounds are normal. Destiny Day exhibits no distension.  Musculoskeletal:  Min edema right shin and right foot. Left TMA RLE 1st digit amputation  Neurological: Destiny Day is alert and oriented.  Motor: B/l UE 5/5 proximal to distal LLE: 4+/5 proximal to distal RLE: HF 4-/5, KE 4-/5, ankle dressed (pain inhibition)  Skin: Skin is warm and dry. Destiny Day is not diaphoretic.  Dry compressive dressing right foot.   Right thigh incisions with dressing c/d/i Psychiatric: Destiny Day has a normal mood and affect. Her behavior is normal. Judgment and thought  content normal.   Assessment/Plan: 1. Functional deficits secondary to RLE 1st digit amputation with history of left TMA which require 3+ hours per day of interdisciplinary therapy in a comprehensive inpatient rehab setting. Physiatrist is providing close team supervision and 24 hour management of active medical problems listed below. Physiatrist and rehab team continue to assess barriers to discharge/monitor patient progress toward functional and medical goals.  Function:  Bathing Bathing position      Bathing parts      Bathing assist        Upper Body Dressing/Undressing Upper body dressing                    Upper body assist        Lower Body Dressing/Undressing Lower body dressing                                  Lower body assist        Toileting Toileting          Toileting assist     Transfers Chair/bed transfer             Locomotion Ambulation  Wheelchair          Cognition Comprehension    Expression    Social Interaction    Problem Solving    Memory      Medical Problem List and Plan: 1.  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   Begin CIR 2.  DVT Prophylaxis/Anticoagulation: Pharmaceutical: Heparin 3. Pain Management: Hydrocodone prn effective 4. Mood: LCSW to follow for evaluation and support when appropriate 5. Neuropsych: This patient is capable of making decisions on her own behalf. 6. Skin/Wound Care: Routine pressure relief measures. Nephro for supplement to help promote healing. Monitor drainage right thigh.  7. Fluids/Electrolytes/Nutrition: Strict I/Os with daily weights. Renal/CM diet with 1200 FR. 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  8. T2DM: Monitor BS ac/hs. Continue Levemir daily.   Monitor with increased mobility 9. ESRD: Continue HD MWF --schedule at end of day to help with activity tolerance and therapy. 10.  Asthma: Respiratory status stable on Dulera bid.  11. PAD s/p FPBG with right great toe amputation: Monitor wounds daily. Monitor drainage from proximal incisions. 12. Anxiety disorder: Managed with diazepam bid prn. 13. Diastolic CHF/HTN: Monitor BP bid. Continue Isordil bid. On lipitor at bedtime. Hydralazine on hold.  Filed Weights   08/16/17 1600 08/17/17 0500  Weight: 69.3 kg (152 lb 12.5 oz) 69.4 kg (152 lb 16 oz)  14. Acute on chronic anemia: On aranesp MWF with HD.   LOS (Days) 1 A FACE TO FACE EVALUATION WAS PERFORMED  Ankit Lorie Phenix 08/17/2017 9:19 AM

## 2017-08-17 NOTE — Discharge Instructions (Signed)
Inpatient Rehab Discharge Instructions  [place] Discharge date and time:    Activities/Precautions/ Functional Status: Activity: no lifting, driving, or strenuous exercise for till cleared by MD Diet: renal diet Wound Care: Contact MD if you develop any problems with your incision/wound--redness, swelling, increase in pain, drainage or if you develop fever or chills.    Functional status:  ___ No restrictions     ___ Walk up steps independently ___ 24/7 supervision/assistance   ___ Walk up steps with assistance ___ Intermittent supervision/assistance  ___ Bathe/dress independently ___ Walk with walker     ___ Bathe/dress with assistance ___ Walk Independently    ___ Shower independently ___ Walk with assistance    ___ Shower with assistance ___ No alcohol     ___ Return to work/school ________  Special Instructions:    My questions have been answered and I understand these instructions. I will adhere to these goals and the provided educational materials after my discharge from the hospital.  Patient/Caregiver Signature _______________________________ Date __________  Clinician Signature _______________________________________ Date __________  Please bring this form and your medication list with you to all your follow-up doctor's appointments.

## 2017-08-17 NOTE — Progress Notes (Signed)
Physical Therapy Session Note  Patient Details  Name: Destiny Day MRN: [id number] Date of Birth: 11-19-1950  Today's Date: 08/17/2017 PT Individual Time: 7829-5621 PT Individual Time Calculation (min): 31 min   Short Term Goals: Week 1:  PT Short Term Goal 1 (Week 1): =LTG  Skilled Therapeutic Interventions/Progress Updates:  Pt sitting in w/c upon arrival and agreeable to PT session. TE: LAQ, Quad sets, hip abd/add - each bilaterally 1X10. Pt requiring rest breaks after 5 reps of LAQ. Pt unable to full extend Rt LE for full arc of motion also with LAQ. Transfers: sit>stand from w/c with min assist, sitting>supine with min guard. Amb: pt ambulating 12 ft with rw, CGA and using darco boot. Following session pt in bed with call light and all needs in reach. Pt fatiguing quickly during session and requiring frequent rest breaks.   Therapy Documentation Precautions:  Precautions Precautions: Fall Other Brace/Splint: Darco shoe on Rt Restrictions Weight Bearing Restrictions: Yes RLE Weight Bearing: Weight bearing as tolerated  Pain: Reports 0/10 at rest  See Function Navigator for Current Functional Status.   Therapy/Group: Individual Therapy  Linard Millers, PT 08/17/2017, 3:00 PM

## 2017-08-17 NOTE — Evaluation (Signed)
Occupational Therapy Assessment and Plan  Patient Details  Name: Destiny Day MRN: [id number] Date of Birth: 1950-11-17  OT Diagnosis: acute pain and muscle weakness (generalized) Rehab Potential: Rehab Potential (ACUTE ONLY): Good ELOS: 7-10 days   Today's Date: 08/17/2017 OT Individual Time: 0930-1030 OT Individual Time Calculation (min): 60 min     Problem List:  Patient Active Problem List   Diagnosis Date Noted  . Generalized anxiety disorder   . Debility 08/16/2017  . Amputated great toe of right foot ([place])   . Amputated toe of left foot ([place])   . Post-operative pain   . ESRD on dialysis ([place])   . Diabetes mellitus ([place])   . Anxiety state   . Benign essential HTN   . Acute blood loss anemia   . Anemia of chronic disease   . Idiopathic chronic venous hypertension of both lower extremities with inflammation 10/13/2016  . S/P transmetatarsal amputation of foot, left ([place]) 09/21/2016  . Onychomycosis 08/15/2016  . Gangrene of toe of left foot ([place])   . PAD (peripheral artery disease) ([place]) 07/08/2016  . Atherosclerosis of native artery of left lower extremity with gangrene ([place]) 06/23/2016  . GERD (gastroesophageal reflux disease) 10/07/2015  . ARF (acute renal failure) ([place])   . Hypothermia 06/12/2015  . Acute on chronic renal failure ([place]) 06/12/2015  . CHF (congestive heart failure) ([place]) 07/30/2014  . CKD (chronic kidney disease) stage 4, GFR 15-29 ml/min (HCC) 06/27/2014  . Hypertensive heart disease 05/25/2013  . CKD (chronic kidney disease) stage 3, GFR 30-59 ml/min (HCC) 05/25/2013  . Pulmonary edema 05/23/2013  . Type 2 diabetes mellitus with diabetic nephropathy ([place]) 05/23/2013  . Type 2 diabetes mellitus with hyperosmolar nonketotic hyperglycemia ([place]) 04/13/2013  . Chronic diastolic CHF (congestive heart failure) ([place]) 04/13/2013  . UGI bleed 03/31/2013  . Hypokalemia 08/24/2012  . Type II or unspecified type diabetes mellitus without mention of  complication, not stated as uncontrolled 12/27/2007  . HLD (hyperlipidemia) 12/27/2007  . ALLERGIC RHINITIS 12/27/2007  . Asthma 12/27/2007    Past Medical History:  Past Medical History:  Diagnosis Date  . Abdominal bruit   . Anemia   . Anxiety   . Arthritis    Osteoarthritis  . Asthma   . Cervical disc disease    "pinced nerve"  . CHF (congestive heart failure) ([place])   . Complication of anesthesia    " after I got home from my last procedure, I started itching."  . Diabetes mellitus    Type II  . Diverticulitis   . ESRD on peritoneal dialysis [facility])    Hemodialysis - MWF- [place]   . GERD (gastroesophageal reflux disease)    from medications  . GI bleed 03/31/2013  . History of hiatal hernia   . Hyperlipidemia   . Hypertension   . Neuropathy    left leg  . Osteoporosis   . Peripheral vascular disease ([place])   . Pneumonia    "very young"  . Seasonal allergies   . Shortness of breath dyspnea    WIth exertion  . Sleep apnea    can't afford cpap   Past Surgical History:  Past Surgical History:  Procedure Laterality Date  . A/V SHUNTOGRAM N/A 09/22/2016   Procedure: A/V Shuntogram - left arm;  Surgeon: Serafina Mitchell, MD;  Location: [place] [facility];  Service: Cardiovascular;  Laterality: N/A;  . ABDOMINAL HYSTERECTOMY  1993`  . AMPUTATION Left 09/01/2016   Procedure: LEFT FOOT TRANSMETATARSAL AMPUTATION;  Surgeon: Newt Minion, MD;  Location: [place];  Service: Orthopedics;  Laterality: Left;  . AMPUTATION Right 08/11/2017   Procedure: RIGHT GREAT TOE AMPUTATION DIGIT;  Surgeon: Rosetta Posner, MD;  Location: [facility] OR;  Service: Vascular;  Laterality: Right;  . AV FISTULA PLACEMENT Left 04/21/2016   Procedure: INSERTION OF ARTERIOVENOUS (AV) GORE-TEX GRAFT ARM LEFT;  Surgeon: Elam Dutch, MD;  Location: [place];  Service: Vascular;  Laterality: Left;  . [place] TRANSPOSITION Left 07/10/2014   Procedure: BASCILIC VEIN TRANSPOSITION;  Surgeon: Angelia Mould,  MD;  Location: [place];  Service: Vascular;  Laterality: Left;  . BASCILIC VEIN TRANSPOSITION Right 11/08/2014   Procedure: FIRST STAGE BASILIC VEIN TRANSPOSITION;  Surgeon: Angelia Mould, MD;  Location: [place];  Service: Vascular;  Laterality: Right;  . BASCILIC VEIN TRANSPOSITION Right 01/18/2015   Procedure: SECOND STAGE BASILIC VEIN TRANSPOSITION;  Surgeon: Angelia Mould, MD;  Location: [facility] OR;  Service: Vascular;  Laterality: Right;  . ESOPHAGOGASTRODUODENOSCOPY N/A 03/31/2013   Procedure: ESOPHAGOGASTRODUODENOSCOPY (EGD);  Surgeon: Gatha Mayer, MD;  Location: [facility] ENDOSCOPY;  Service: Endoscopy;  Laterality: N/A;  . EYE SURGERY     laser surgery  . FEMORAL-POPLITEAL BYPASS GRAFT Left 07/08/2016   Procedure: LEFT  FEMORAL-BELOW KNEE POPLITEAL ARTERY BYPASS GRAFT USING 6MM X 80 CM PROPATEN GORETEX GRAFT WITH RINGS.;  Surgeon: Rosetta Posner, MD;  Location: [place];  Service: Vascular;  Laterality: Left;  . FEMORAL-POPLITEAL BYPASS GRAFT Right 08/11/2017   Procedure: RIGHT FEMORAL TO BELOW KNEE POPLITEAL ARTERKY  BYPASS GRAFT USING 6MM RINGED PROPATEN GRAFT;  Surgeon: Rosetta Posner, MD;  Location: [place];  Service: Vascular;  Laterality: Right;  . FISTULOGRAM Left 10/29/2014   Procedure: FISTULOGRAM;  Surgeon: Angelia Mould, MD;  Location: [facility] CATH LAB;  Service: Cardiovascular;  Laterality: Left;  . LIGATION OF ARTERIOVENOUS  FISTULA Left 04/21/2016   Procedure: LIGATION OF ARTERIOVENOUS  FISTULA LEFT ARM;  Surgeon: Elam Dutch, MD;  Location: [place];  Service: Vascular;  Laterality: Left;  . LOWER EXTREMITY ANGIOGRAPHY N/A 04/06/2017   Procedure: Lower Extremity Angiography - Right;  Surgeon: Serafina Mitchell, MD;  Location: [place] [facility];  Service: Cardiovascular;  Laterality: N/A;  . PATCH ANGIOPLASTY Right 01/18/2015   Procedure: BASILIC VEIN PATCH ANGIOPLASTY USING VASCUGUARD PATCH;  Surgeon: Angelia Mould, MD;  Location: [place];  Service: Vascular;  Laterality:  Right;  . PERIPHERAL VASCULAR BALLOON ANGIOPLASTY  09/22/2016   Procedure: Peripheral Vascular Balloon Angioplasty;  Surgeon: Serafina Mitchell, MD;  Location: [place] [facility];  Service: Cardiovascular;;  Lt. Fistula  . PERIPHERAL VASCULAR CATHETERIZATION N/A 06/23/2016   Procedure: Abdominal Aortogram w/Lower Extremity;  Surgeon: Serafina Mitchell, MD;  Location: [place] [facility];  Service: Cardiovascular;  Laterality: N/A;  . PERIPHERAL VASCULAR CATHETERIZATION  06/23/2016   Procedure: Peripheral Vascular Intervention;  Surgeon: Serafina Mitchell, MD;  Location: [place] [facility];  Service: Cardiovascular;;  lt common and external illiac artery    Assessment & Plan Clinical Impression: SHAELEE FORNI is a 67 y.o. female with history of ESRD- HD, diastolic congestive heart failure, diabetes mellitus, HTN, PVD s/p 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 History taken from chrat review and patient. She was admitted on 08/11/17 with ischemic right foot with gangrenous changes and underwent right femoral to below knee BG and right great toe amputation by Dr. Donnetta Hutching.  Post op WBAT. She has had some drainage from right thigh  incisions  and reactive leucocytosis resolving. ABLA being monitored and continues on aranesp for anemia of chronic disease. Therapy ongoing and patient with functional deficits. CIR recommended for follow up therapy    Patient transferred to CIR on 08/16/2017 .    Patient currently requires mod with basic self-care skills secondary to muscle weakness, decreased cardiorespiratoy endurance and decreased standing balance.  Prior to hospitalization, patient could complete ADLs with modified independent .  Patient will benefit from skilled intervention to increase independence with basic self-care skills prior to discharge home with care partner.  Anticipate patient will require intermittent supervision and follow up home health.  OT - End of Session Endurance Deficit:  Yes Endurance Deficit Description: limited by pain and vertigo in standing OT Assessment Rehab Potential (ACUTE ONLY): Good OT Patient demonstrates impairments in the following area(s): Balance;Endurance;Motor;Pain OT Basic ADL's Functional Problem(s): Grooming;Bathing;Dressing;Toileting OT Transfers Functional Problem(s): Toilet OT Plan OT Intensity: Minimum of 1-2 x/day, 45 to 90 minutes OT Frequency: 5 out of 7 days OT Duration/Estimated Length of Stay: 7-10 days OT Treatment/Interventions: Balance/vestibular training;Discharge planning;Functional mobility training;DME/adaptive equipment instruction;Patient/family education;Self Care/advanced ADL retraining;Therapeutic Activities;Therapeutic Exercise;UE/LE Strength taining/ROM OT Self Feeding Anticipated Outcome(s): no goal OT Basic Self-Care Anticipated Outcome(s): mod I OT Toileting Anticipated Outcome(s): mod I OT Bathroom Transfers Anticipated Outcome(s): mod I to toilet OT Recommendation Patient destination: Home Follow Up Recommendations: Home health OT Equipment Recommended: 3 in 1 bedside comode;To be determined   Skilled Therapeutic Intervention Pt seen for initial evaluation and ADL training. Explained purpose and role of OT which pt was familiar with as she has recently been in rehab in a different facility.  Pt has excellent awareness of her limitations and is able to guide caregivers well.  She is very deconditioned as she became dizzy with standing initially but after sitting up in w/c for 30 min she was then able to tolerate sit to stand 3x for 30 seconds each.  Instead of using RW for stand pivot transfers used squat pivots. Overall she is needing mod A with transfers and LB dressing.  Discussed estimated LOS, goals. Pt opted to sit in chair to rest with all needs met.    OT Evaluation Precautions/Restrictions  Precautions Precautions: Fall Other Brace/Splint: Darco shoe on Rt Restrictions RLE Weight Bearing: Weight  bearing as tolerated   Pain Pain Assessment Pain Assessment: 0-10 Pain Score: 4  Pain Type: Surgical pain Pain Location: Leg Pain Orientation: Right Pain Descriptors / Indicators: Aching Pain Onset: With Activity Pain Intervention(s): Rest Home Living/Prior Functioning Home Living Family/patient expects to be discharged to:: Inpatient rehab Living Arrangements: Spouse/significant other Available Help at Discharge: Family, Available 24 hours/day Type of Home: House Home Access: Stairs to enter [employer] of Steps: 2 Entrance Stairs-Rails: Left Home Layout: Multi-level, Bed/bath upstairs Alternate Level Stairs-Number of Steps: 6 and 6 split level Alternate Level Stairs-Rails: Left Bathroom Shower/Tub: Tub/shower unit, Air cabin crew Accessibility: Yes Additional Comments: 2 rollators at home, walker  Lives With: Spouse, Other (Comment)(25 yo grandson) Prior Function Level of Independence: Independent with basic ADLs, Independent with homemaking with ambulation, Requires assistive device for independence(sits on rollator with cooking)  Able to Take Stairs?: Yes Driving: Yes Vocation: Retired Comments: walked with cane ADL ADL ADL Comments: refer to functional navigator Vision Baseline Vision/History: Wears glasses Wears Glasses: At all times Patient Visual Report: No change from baseline Vision Assessment?: No apparent visual deficits Perception  Perception: Within Functional Limits Praxis Praxis: Intact Cognition Overall Cognitive Status: Within  Functional Limits for tasks assessed Arousal/Alertness: Awake/alert Orientation Level: Person;Place;Situation Person: Oriented Place: Oriented Situation: Oriented Year: 2019 Month: January Day of Week: Correct Memory: Appears intact Immediate Memory Recall: Sock;Blue;Bed Memory Recall: Sock;Blue;Bed Memory Recall Sock: Without Cue Memory Recall Blue: With Cue Memory Recall  Bed: Without Cue Sensation Sensation Light Touch: Appears Intact Stereognosis: Appears Intact Hot/Cold: Appears Intact Proprioception: Appears Intact Additional Comments: L foot neuropathy Coordination Gross Motor Movements are Fluid and Coordinated: Yes Fine Motor Movements are Fluid and Coordinated: Yes Motor  Motor Motor - Skilled Clinical Observations: generalized motor weakness Mobility    mod A squat pivot transfers and sit to stand Trunk/Postural Assessment  Cervical Assessment Cervical Assessment: Within Functional Limits Thoracic Assessment Thoracic Assessment: Within Functional Limits Lumbar Assessment Lumbar Assessment: Within Functional Limits Postural Control Postural Control: Within Functional Limits  Balance Static Standing Balance Static Standing - Level of Assistance: 4: Min assist Dynamic Standing Balance Dynamic Standing - Level of Assistance: 3: Mod assist Extremity/Trunk Assessment RUE Assessment RUE Assessment: Exceptions to WFL(limited shoulder AROM due osteoarthritis, limited finger extension due to OA) LUE Assessment LUE Assessment: Exceptions to WFL(limited shoulder AROM due to OA)   See Function Navigator for Current Functional Status.   Refer to Care Plan for Long Term Goals  Recommendations for other services: None    Discharge Criteria: Patient will be discharged from OT if patient refuses treatment 3 consecutive times without medical reason, if treatment goals not met, if there is a change in medical status, if patient makes no progress towards goals or if patient is discharged from hospital.  The above assessment, treatment plan, treatment alternatives and goals were discussed and mutually agreed upon: by patient  [facility] 08/17/2017, 12:32 PM

## 2017-08-18 ENCOUNTER — Inpatient Hospital Stay (HOSPITAL_COMMUNITY): Payer: Medicare Other | Admitting: *Deleted

## 2017-08-18 ENCOUNTER — Inpatient Hospital Stay (HOSPITAL_COMMUNITY): Payer: Medicare Other

## 2017-08-18 ENCOUNTER — Inpatient Hospital Stay (HOSPITAL_COMMUNITY): Payer: Medicare Other | Admitting: Physical Therapy

## 2017-08-18 ENCOUNTER — Inpatient Hospital Stay (HOSPITAL_COMMUNITY): Payer: Medicare Other | Admitting: Occupational Therapy

## 2017-08-18 DIAGNOSIS — D638 Anemia in other chronic diseases classified elsewhere: Secondary | ICD-10-CM

## 2017-08-18 DIAGNOSIS — D72829 Elevated white blood cell count, unspecified: Secondary | ICD-10-CM

## 2017-08-18 DIAGNOSIS — I798 Other disorders of arteries, arterioles and capillaries in diseases classified elsewhere: Secondary | ICD-10-CM

## 2017-08-18 DIAGNOSIS — I509 Heart failure, unspecified: Secondary | ICD-10-CM

## 2017-08-18 DIAGNOSIS — R0989 Other specified symptoms and signs involving the circulatory and respiratory systems: Secondary | ICD-10-CM

## 2017-08-18 DIAGNOSIS — D62 Acute posthemorrhagic anemia: Secondary | ICD-10-CM

## 2017-08-18 DIAGNOSIS — I10 Essential (primary) hypertension: Secondary | ICD-10-CM

## 2017-08-18 DIAGNOSIS — E1159 Type 2 diabetes mellitus with other circulatory complications: Secondary | ICD-10-CM

## 2017-08-18 DIAGNOSIS — I96 Gangrene, not elsewhere classified: Secondary | ICD-10-CM

## 2017-08-18 LAB — GLUCOSE, CAPILLARY
Glucose-Capillary: 114 mg/dL — ABNORMAL HIGH (ref 65–99)
Glucose-Capillary: 124 mg/dL — ABNORMAL HIGH (ref 65–99)
Glucose-Capillary: 138 mg/dL — ABNORMAL HIGH (ref 65–99)
Glucose-Capillary: 176 mg/dL — ABNORMAL HIGH (ref 65–99)

## 2017-08-18 MED ORDER — HEPARIN SODIUM (PORCINE) 1000 UNIT/ML DIALYSIS
3500.0000 [IU] | Freq: Once | INTRAMUSCULAR | Status: DC
  Filled 2017-08-18: qty 4

## 2017-08-18 MED ORDER — HYDROCODONE-ACETAMINOPHEN 5-325 MG PO TABS
ORAL_TABLET | ORAL | Status: AC
  Filled 2017-08-18: qty 2

## 2017-08-18 NOTE — Progress Notes (Signed)
Physical Therapy Note  Patient Details  Name: MARKESHIA GIEBEL MRN: [id number] Date of Birth: Feb 02, 1951 Today's Date: 08/18/2017  1406- 1436, 30 min individual tx Pain: 2/10 R lower leg and thigh, surgical site, declined meds  Bed mobility with extra time, supervision.  PT assisted pt in donning shoes for time mgt.  Stand pivot bed> w/c to R with RW, min assist.  Gait training up/down indoor standard ramp with RW with min assist, cues for sequencing initially.  Pt reported that her driveway is much steeper than ramp.  Pt reported that her husband and dtr are discussing having family drive her to/from HD to avoid her ambulating on sloping driveway. Previously, she drove herself to HD and exited car in garage and did not have to ambulate sloping driveway.  PT agreed that negotiating her driveway is a safety issue, especially when weak from HD.  Pt left resting in bed with all needs within reach.  See function navigator for current status.   Alen Matheson 08/18/2017, 2:26 PM

## 2017-08-18 NOTE — Plan of Care (Signed)
  Progressing Consults RH GENERAL PATIENT EDUCATION Description See Patient Education module for education specifics. 08/18/2017 1520 - Progressing by Brita Romp, RN RH BOWEL ELIMINATION RH STG MANAGE BOWEL WITH ASSISTANCE Description STG Manage Bowel with [place].  08/18/2017 1520 - Progressing by Brita Romp, RN RH SKIN INTEGRITY RH STG SKIN FREE OF INFECTION/BREAKDOWN Description Pt will remain free of skin breakdown with min assist/cues.   08/18/2017 1520 - Progressing by Brita Romp, RN RH STG MAINTAIN SKIN INTEGRITY WITH ASSISTANCE Description STG Maintain Skin Integrity With min Assistance.  08/18/2017 1520 - Progressing by Brita Romp, RN RH STG ABLE TO PERFORM INCISION/WOUND CARE W/ASSISTANCE Description STG Able To Perform Incision/Wound Care With min Assistance.  08/18/2017 1520 - Progressing by Brita Romp, RN RH PAIN MANAGEMENT RH STG PAIN MANAGED AT OR BELOW PT'S PAIN GOAL Description < 3 out of 10.   08/18/2017 1520 - Progressing by Brita Romp, RN   Not Applicable RH BLADDER ELIMINATION RH STG MANAGE BLADDER WITH ASSISTANCE Description STG Manage Bladder With Mod I Assistance  [id number] - Not Applicable by Brita Romp, RN

## 2017-08-18 NOTE — Progress Notes (Signed)
Post dialysis report received from [place], [place]

## 2017-08-18 NOTE — Progress Notes (Addendum)
Physical Therapy Note  Patient Details  Name: Destiny Day MRN: [id number] Date of Birth: 1951/04/19 Today's Date: 08/18/2017    Time: 830-929 59 minutes  1:1 No c/o pain.  Pt performs bed mobility with supervision, sit to stand with min guard from bed.  Pt performs gait with RW with focus on turns with close supervision 50' x 3 during session.  Stair negotiation to simulate home stairs with SPC and 1 railing with min A, pt leaning on railing for stability.  Standing tolerance with horseshoe task with pt able to stand 2 x 4 minutes.  Sit to stand repetitions with focus on LE power up and eccentric control.  W/c mobility x 100' with multiple rest breaks due to fatigue, supervision. Pt left in room with needs at hand.   Time 2: 1100-1141 41 minutes  1:1 Pt with no c/o pain.  Pt performs w/c mobility throughout unit with supervision.  Session focused on stair negotiation and problem solving for home entry/exit to home.  Pt performed 6'' stairs with SPC and 1 railing 2 x 4 stairs with prolonged rest break in between.  Discussed that pt has sloped driveway and would have to negotiate stairs with SPC, pick up RW and walk down slope by herself to get to dialysis.  Pt/husband present and both agree with PT that if it is possible it would be best to have someone with the pt to assist getting into and out of home.  [name] 08/18/2017, 9:29 AM

## 2017-08-18 NOTE — Progress Notes (Signed)
Occupational Therapy Session Note  Patient Details  Name: Destiny Day MRN: [id number] Date of Birth: 1951/07/27  Today's Date: 08/18/2017 OT Individual Time: 9201-0071 OT Individual Time Calculation (min): 60 min    Short Term Goals: Week 1:  OT Short Term Goal 1 (Week 1): STGs = LTGs  Skilled Therapeutic Interventions/Progress Updates:    Pt seen for ADL training with sink level bathing and dressing. Pt has much improved pain levels and is now able to reach toward her T foot and tolerate standing for a few minutes.  Pt only needed S with donning clothing over feet and donning L shoe.  She stood to sink and needed min A to support balance while pulling her pants up.  Pt tolerated standing with no dizziness.  She is moving more proficiently, but the actual ADL routine takes awhile as pt has particular methods for completing tasks.  Pt in w/c at sink at end of session completing grooming tasks.   Therapy Documentation Precautions:  Precautions Precautions: Fall Other Brace/Splint: Darco shoe on Rt Restrictions Weight Bearing Restrictions: Yes RLE Weight Bearing: Weight bearing as tolerated  Pain: Pain Assessment Pain Assessment: (" a little sore" in bend of knee) Pain Score: 0-No pain ADL: ADL ADL Comments: refer to functional navigator  See Function Navigator for Current Functional Status.   Therapy/Group: Individual Therapy  Jean Alejos 08/18/2017, 10:12 AM

## 2017-08-18 NOTE — Progress Notes (Signed)
[facility]ILIT[facility]     PROGRESS NOTE  Subjective/Complaints:  Pt seen lying in bed this AM.  She states she did not sleep well overnight due to frequent BMs after laxative, but feels better now.  She notes a busy first day of therapies yesterday.   ROS: Denies CP, SOB, N/V/D.  Objective: Vital Signs: Blood pressure (!) 150/75, pulse (!) 120, temperature 98.1 F (36.7 C), temperature source Oral, resp. rate 18, height 5\' 5"  (1.651 m), weight 69.6 kg (153 lb 7 oz), SpO2 100 %. No results found. Recent Labs    08/16/17 0715  WBC 12.0*  HGB 8.2*  HCT 25.8*  PLT 273   Recent Labs    08/16/17 0715  NA 131*  K 3.8  CL 92*  GLUCOSE 95  BUN 34*  CREATININE 5.96*  CALCIUM 8.6*   CBG (last 3)  Recent Labs    08/17/17 1647 08/17/17 2051 08/18/17 0637  GLUCAP 152* 228* 176*    Wt Readings from Last 3 Encounters:  08/18/17 69.6 kg (153 lb 7 oz)  08/16/17 66 kg (145 lb 8.1 oz)  05/25/17 68.5 kg (151 lb)    Physical Exam:  BP (!) 150/75 (BP Location: Right Wrist)   Pulse (!) 120   Temp 98.1 F (36.7 C) (Oral)   Resp 18   Ht 5\' 5"  (1.651 m)   Wt 69.6 kg (153 lb 7 oz)   SpO2 100%   BMI 25.53 kg/m  Constitutional: She appears well-developed and well-nourished. No distress.  HENT: Normocephalic and atraumatic.  Eyes: EOM are normal. No discharge.  Cardiovascular: Regular rate and rhythm. No JVD. Respiratory: Effort Normal and breath sounds normal.  GI: Bowel sounds are normal. She exhibits no distension.  Musculoskeletal:  Min edema right shin and right foot. 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  Motor: B/l UE 5/5 proximal to distal LLE: 4+/5 proximal to distal RLE: HF 4-/5, KE 4-/5, ankle dressed (pain inhibition, stable)  Skin: Skin is warm and dry. She is not diaphoretic.  Dry compressive dressing right foot.   Right thigh incisions with dressing c/d/i Psychiatric: She has a normal mood and  affect. Her behavior is normal. Judgment and thought content normal.   Assessment/Plan: 1. Functional deficits secondary to RLE 1st digit amputation with history of left TMA which require 3+ hours per day of interdisciplinary therapy in a comprehensive inpatient rehab setting. Physiatrist is providing close team supervision and 24 hour management of active medical problems listed below. Physiatrist and rehab team continue to assess barriers to discharge/monitor patient progress toward functional and medical goals.  Function:  Bathing Bathing position   Position: Wheelchair/chair at sink  Bathing parts Body parts bathed by patient: Right arm, Left arm, Chest, Front perineal area, Right upper leg, Left upper leg, Left lower leg Body parts bathed by helper: Buttocks, Right lower leg, Back  Bathing assist        Upper Body Dressing/Undressing Upper body dressing   What is the patient wearing?: Pull over shirt/dress     Pull over shirt/dress - Perfomed by patient: Thread/unthread right sleeve, Thread/unthread left sleeve, Put head through opening, Pull shirt over trunk          Upper body assist Assist Level: Supervision or verbal cues      Lower Body Dressing/Undressing Lower body dressing   What is the patient wearing?: Underwear, [employer], Shoes, Non-skid slipper socks Underwear - Performed by  patient: Thread/unthread left underwear leg, Pull underwear up/down Underwear - Performed by helper: Thread/unthread right underwear leg     Non-skid slipper socks- Performed by patient: Don/doff right sock         Shoes - Performed by helper: Don/doff right shoe, Don/doff left shoe, Fasten left, Fasten right     TED Hose - Performed by patient: Don/doff left TED hose    Lower body assist        Toileting Toileting          Toileting assist     Transfers Chair/bed transfer   Chair/bed transfer method: Squat pivot Chair/bed transfer assist level: Moderate assist (Pt 50 -  74%/lift or lower) Chair/bed transfer assistive device: Armrests     Locomotion Ambulation     Max distance: 35 Assist level: Touching or steadying assistance (Pt > 75%)   Wheelchair   Type: Manual Max wheelchair distance: 75 Assist Level: Supervision or verbal cues  Cognition Comprehension Comprehension assist level: Follows complex conversation/direction with no assist  Expression Expression assist level: Expresses complex ideas: With no assist  Social Interaction Social Interaction assist level: Interacts appropriately with others - No medications needed.  Problem Solving Problem solving assist level: Solves complex 90% of the time/cues < 10% of the time  Memory Memory assist level: Recognizes or recalls 90% of the time/requires cueing < 10% of the time    Medical Problem List and Plan: 1.  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   Cont CIR 2.  DVT Prophylaxis/Anticoagulation: Pharmaceutical: Heparin 3. Pain Management: Hydrocodone prn effective 4. Mood: LCSW to follow for evaluation and support when appropriate 5. Neuropsych: This patient is capable of making decisions on her own behalf. 6. Skin/Wound Care: Routine pressure relief measures. Nephro for supplement to help promote healing. Monitor drainage right thigh.  7. Fluids/Electrolytes/Nutrition: Strict I/Os with daily weights. Renal/CM diet with 1200 FR. 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  8. T2DM: Monitor BS ac/hs. Continue Levemir daily.   Elevated, will consider further increase tomorrow 9. ESRD: Continue HD MWF --schedule at end of day to help with activity tolerance and therapy. 10. Asthma: Respiratory status stable on Dulera bid.  11. PAD s/p FPBG with right great toe amputation: Monitor wounds daily. Monitor drainage from proximal incisions. 12. Anxiety disorder: Managed with diazepam bid prn. 13. Diastolic CHF/HTN: Monitor BP bid. Continue  Isordil bid. On lipitor at bedtime. Hydralazine on hold.  Filed Weights   08/16/17 1600 08/17/17 0500 08/18/17 0213  Weight: 69.3 kg (152 lb 12.5 oz) 69.4 kg (152 lb 16 oz) 69.6 kg (153 lb 7 oz)  14. Acute on chronic anemia: On aranesp MWF with HD.    Hb 8.2 on 1/21   Cont to monitor 15. HTN   Cont meds   Labile, likely related to HD on 1/23 16. Leukocytosis   WBCs 12.0 on 1/21   Afebrile   Cont to monitor      LOS (Days) 2 A FACE TO FACE EVALUATION WAS PERFORMED  Semone Orlov Lorie Phenix 08/18/2017 9:33 AM

## 2017-08-18 NOTE — Progress Notes (Signed)
Subjective: Interval History: has no complaint .  Objective: Vital signs in last 24 hours: Temp:  [98.1 F (36.7 C)-98.8 F (37.1 C)] 98.8 F (37.1 C) (01/23 1421) Pulse Rate:  [120-130] 130 (01/23 1421) Resp:  [18] 18 (01/23 1421) BP: (114-150)/(58-75) 114/58 (01/23 1421) SpO2:  [99 %-100 %] 99 % (01/23 1421) Weight:  [69.6 kg (153 lb 7 oz)] 69.6 kg (153 lb 7 oz) (01/23 0213) Weight change: 0.3 kg (10.6 oz)  Intake/Output from previous day: 01/22 0701 - 01/23 0700 In: 720 [P.O.:720] Out: -  Intake/Output this shift: Total I/O In: 462 [P.O.:462] Out: -   General appearance: alert, cooperative and no distress Resp: clear to auscultation bilaterally Cardio: S1, S2 normal and systolic murmur: systolic ejection 2/6 GI: soft, non-tender; bowel sounds normal; no masses,  no organomegaly Extremities: avf ok  Lab Results: Recent Labs    08/16/17 0715  WBC 12.0*  HGB 8.2*  HCT 25.8*  PLT 273   BMET:  Recent Labs    08/16/17 0715  NA 131*  K 3.8  CL 92*  CO2 26  GLUCOSE 95  BUN 34*  CREATININE 5.96*  CALCIUM 8.6*   No results for input(s): PTH in the last 72 hours. Iron Studies: No results for input(s): IRON, TIBC, TRANSFERRIN, FERRITIN in the last 72 hours.  Studies/Results: No results found.  I have reviewed the patient's current medications.  Dialysis Orders:  [facility]   4h   66.5kg  3K/ 2.25   Profile 4   LUA AVG   Hep 2500 , 1000 midrun Venofer 50mg  IV qwk - last 1/11 Mircera 53mcg IV q2wks - last 1/09   Assessment: 1. SP R fem-pop bypass & R great toe amputation, Dr. Oneida Alar, 1/16  2. ESRD HD MWF 3. HTN stable 4. Vol +3kg today 5. Anemia of CKD - check Hb, darbe 150 q mon 6. DM  Plan - HD tonight after rehab  LOS: 2 days   Sol Blazing 08/18/2017,3:21 PM

## 2017-08-19 ENCOUNTER — Inpatient Hospital Stay (HOSPITAL_COMMUNITY): Payer: Medicare Other | Admitting: Occupational Therapy

## 2017-08-19 ENCOUNTER — Inpatient Hospital Stay (HOSPITAL_COMMUNITY): Payer: Medicare Other | Admitting: Physical Therapy

## 2017-08-19 ENCOUNTER — Inpatient Hospital Stay (HOSPITAL_COMMUNITY): Payer: Medicare Other | Admitting: *Deleted

## 2017-08-19 LAB — CBC
HCT: 31.8 % — ABNORMAL LOW (ref 36.0–46.0)
Hemoglobin: 10.2 g/dL — ABNORMAL LOW (ref 12.0–15.0)
MCH: 32.3 pg (ref 26.0–34.0)
MCHC: 32.1 g/dL (ref 30.0–36.0)
MCV: 100.6 fL — ABNORMAL HIGH (ref 78.0–100.0)
Platelets: 374 10*3/uL (ref 150–400)
RBC: 3.16 MIL/uL — ABNORMAL LOW (ref 3.87–5.11)
RDW: 16.1 % — ABNORMAL HIGH (ref 11.5–15.5)
WBC: 12 10*3/uL — ABNORMAL HIGH (ref 4.0–10.5)

## 2017-08-19 LAB — HEPATITIS B SURFACE ANTIGEN: Hepatitis B Surface Ag: NEGATIVE

## 2017-08-19 LAB — RENAL FUNCTION PANEL
Albumin: 3 g/dL — ABNORMAL LOW (ref 3.5–5.0)
Anion gap: 12 (ref 5–15)
BUN: 12 mg/dL (ref 6–20)
CO2: 29 mmol/L (ref 22–32)
Calcium: 9.4 mg/dL (ref 8.9–10.3)
Chloride: 92 mmol/L — ABNORMAL LOW (ref 101–111)
Creatinine, Ser: 3.51 mg/dL — ABNORMAL HIGH (ref 0.44–1.00)
GFR calc Af Amer: 15 mL/min — ABNORMAL LOW (ref >60)
GFR calc non Af Amer: 13 mL/min — ABNORMAL LOW (ref >60)
Glucose, Bld: 137 mg/dL — ABNORMAL HIGH (ref 65–99)
Phosphorus: 1.5 mg/dL — ABNORMAL LOW (ref 2.5–4.6)
Potassium: 4 mmol/L (ref 3.5–5.1)
Sodium: 133 mmol/L — ABNORMAL LOW (ref 135–145)

## 2017-08-19 LAB — GLUCOSE, CAPILLARY
Glucose-Capillary: 147 mg/dL — ABNORMAL HIGH (ref 65–99)
Glucose-Capillary: 115 mg/dL — ABNORMAL HIGH (ref 65–99)
Glucose-Capillary: 144 mg/dL — ABNORMAL HIGH (ref 65–99)
Glucose-Capillary: 88 mg/dL (ref 65–99)

## 2017-08-19 NOTE — Plan of Care (Signed)
  Progressing Consults RH GENERAL PATIENT EDUCATION Description See Patient Education module for education specifics. 08/19/2017 1527 - Progressing by Brita Romp, RN RH BOWEL ELIMINATION RH STG MANAGE BOWEL WITH ASSISTANCE Description STG Manage Bowel with [place].  08/19/2017 1527 - Progressing by Brita Romp, RN RH SKIN INTEGRITY RH STG SKIN FREE OF INFECTION/BREAKDOWN Description Pt will remain free of skin breakdown with min assist/cues.   08/19/2017 1527 - Progressing by Brita Romp, RN RH STG MAINTAIN SKIN INTEGRITY WITH ASSISTANCE Description STG Maintain Skin Integrity With min Assistance.  08/19/2017 1527 - Progressing by Brita Romp, RN RH PAIN MANAGEMENT RH STG PAIN MANAGED AT OR BELOW PT'S PAIN GOAL Description < 3 out of 10.   08/19/2017 1527 - Progressing by Brita Romp, RN   Not Progressing RH SKIN INTEGRITY RH STG ABLE TO PERFORM INCISION/WOUND CARE W/ASSISTANCE Description STG Able To Perform Incision/Wound Care With min Assistance.  08/19/2017 1527 - Not Progressing by Brita Romp, RN Note Patient will need physical assistance from another person to perform dressing changes on foot and thigh

## 2017-08-19 NOTE — Progress Notes (Signed)
Social Work Patient ID: Destiny Day, female   DOB: April 23, 1951, 67 y.o.   MRN: [id number]   Have reviewed team conference with pt and spouse.  Both aware and agreeable with targeted d/c date of 1/29 and modified independent goals overall.  Pt c/o nausea so kept visit brief.  No concerns or questions at this time.  Destiny Steenbergen, LCSW

## 2017-08-19 NOTE — Progress Notes (Signed)
Occupational Therapy Session Note  Patient Details  Name: Destiny Day MRN: [id number] Date of Birth: 1951/02/12  Today's Date: 08/19/2017 OT Individual Time: 0800-0900 OT Individual Time Calculation (min): 60 min    Short Term Goals: Week 1:  OT Short Term Goal 1 (Week 1): STGs = LTGs  Skilled Therapeutic Interventions/Progress Updates:    Pt presents supine in bed, no c/o pain and agreeable to OT tx session. Pt declining bathing/dressing ADLs this AM due to skin sensitivity to hospital soap, reports plans to buy soap or have spouse bring some in. Pt completes bed mobility with S and HOB elevated, dons L shoe and R darco shoe with S and increased time. Pt completes stand pivot EOB>w/c using RW and stand pivot w/c<>toilet using grab bar with MinA throughout; completes toileting with overall Min steadying assist during clothing management. Session included trip to gift shop for purchase of personal items with focus on Pt navigating w/c through various tight spaces/hallways as well as increasing UB strength/endurance. Pt propelling w/c during majority of outing with S when navigating smaller areas. Gift shop was not open due to earlier therapy time, therefore practiced navigating around tables/chairs within atrium. Pt returned to room end of session in manner described above where Pt was left seated in w/c with call bell and needs within reach, RT arriving to begin session.   Therapy Documentation Precautions:  Precautions Precautions: Fall Other Brace/Splint: Darco shoe on Rt Restrictions Weight Bearing Restrictions: Yes RLE Weight Bearing: Weight bearing as tolerated      ADL: ADL ADL Comments: refer to functional navigator  See Function Navigator for Current Functional Status.   Therapy/Group: Individual Therapy  Raymondo Band 08/19/2017, 11:50 AM

## 2017-08-19 NOTE — Progress Notes (Signed)
[facility]ICAL MEDICINE & REHABILITATION     PROGRESS NOTE  Subjective/Complaints:  Pt seen lying in bed this AM.  She did not sleep well overnight because she was itching all night.  She states she is allergic to the bath soap and has requested husband to bring in from home.  She also has questions about the discharge date.   ROS: Denies CP, SOB, N/V/D.  Objective: Vital Signs: Blood pressure 134/66, pulse 93, temperature 98.4 F (36.9 C), temperature source Oral, resp. rate 18, height 5\' 5"  (1.651 m), weight 66.1 kg (145 lb 11.6 oz), SpO2 98 %. No results found. Recent Labs    08/19/17 0710  WBC 12.0*  HGB 10.2*  HCT 31.8*  PLT 374   Recent Labs    08/19/17 0710  NA 133*  K 4.0  CL 92*  GLUCOSE 137*  BUN 12  CREATININE 3.51*  CALCIUM 9.4   CBG (last 3)  Recent Labs    08/18/17 1629 08/18/17 2352 08/19/17 0650  GLUCAP 124* 138* 147*    Wt Readings from Last 3 Encounters:  08/19/17 66.1 kg (145 lb 11.6 oz)  08/16/17 66 kg (145 lb 8.1 oz)  05/25/17 68.5 kg (151 lb)    Physical Exam:  BP 134/66 (BP Location: Right Wrist)   Pulse 93   Temp 98.4 F (36.9 C) (Oral)   Resp 18   Ht 5\' 5"  (1.651 m)   Wt 66.1 kg (145 lb 11.6 oz)   SpO2 98%   BMI 24.25 kg/m  Constitutional: She appears well-developed and well-nourished. No distress.  HENT: Normocephalic and atraumatic.  Eyes: EOM are normal. No discharge.  Cardiovascular: RRR. No JVD. Respiratory: Effort normal and breath sounds normal.  GI: Bowel sounds are normal. She exhibits no distension.  Musculoskeletal:  Min edema right shin and right foot. 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  Motor: B/l UE 5/5 proximal to distal LLE: 4+/5 proximal to distal RLE: HF 4-/5, KE 4-/5, ankle dressed (pain inhibition, unchanged)  Skin: Skin is warm and dry. She is not diaphoretic.  Dressing right foot c/d/i   Right thigh incisions with dressing c/d/i Psychiatric: She has a  normal mood and affect. Her behavior is normal. Judgment and thought content normal.   Assessment/Plan: 1. Functional deficits secondary to RLE 1st digit amputation with history of left TMA which require 3+ hours per day of interdisciplinary therapy in a comprehensive inpatient rehab setting. Physiatrist is providing close team supervision and 24 hour management of active medical problems listed below. Physiatrist and rehab team continue to assess barriers to discharge/monitor patient progress toward functional and medical goals.  Function:  Bathing Bathing position   Position: Wheelchair/chair at sink  Bathing parts Body parts bathed by patient: Right arm, Left arm, Chest, Front perineal area, Right upper leg, Left upper leg, Left lower leg, Abdomen, Buttocks Body parts bathed by helper: Back  Bathing assist        Upper Body Dressing/Undressing Upper body dressing   What is the patient wearing?: Pull over shirt/dress     Pull over shirt/dress - Perfomed by patient: Thread/unthread right sleeve, Thread/unthread left sleeve, Put head through opening, Pull shirt over trunk          Upper body assist Assist Level: Set up      Lower Body Dressing/Undressing Lower body dressing   What is the patient wearing?: Underwear, [employer], Shoes, Non-skid slipper socks, Pants Underwear - Performed  by patient: Thread/unthread left underwear leg, Pull underwear up/down, Thread/unthread right underwear leg Underwear - Performed by helper: Thread/unthread right underwear leg Pants- Performed by patient: Thread/unthread right pants leg, Thread/unthread left pants leg, Pull pants up/down           Shoes - Performed by patient: Don/doff left shoe, Fasten left Shoes - Performed by helper: Don/doff right shoe, Fasten right     TED Hose - Performed by patient: Don/doff left TED hose    Lower body assist Assist for lower body dressing: Touching or steadying assistance (Pt > 75%)       Toileting Toileting          Toileting assist     Transfers Chair/bed transfer   Chair/bed transfer method: Stand pivot Chair/bed transfer assist level: Touching or steadying assistance (Pt > 75%) Chair/bed transfer assistive device: Medical sales representative     Max distance: 35 Assist level: Touching or steadying assistance (Pt > 75%)   Wheelchair   Type: Manual Max wheelchair distance: 50 Assist Level: Supervision or verbal cues  Cognition Comprehension Comprehension assist level: Follows complex conversation/direction with no assist  Expression Expression assist level: Expresses complex ideas: With no assist  Social Interaction Social Interaction assist level: Interacts appropriately with others - No medications needed.  Problem Solving Problem solving assist level: Solves complex 90% of the time/cues < 10% of the time  Memory Memory assist level: Recognizes or recalls 90% of the time/requires cueing < 10% of the time    Medical Problem List and Plan: 1.  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   Cont CIR 2.  DVT Prophylaxis/Anticoagulation: Pharmaceutical: Heparin 3. Pain Management: Hydrocodone prn effective 4. Mood: LCSW to follow for evaluation and support when appropriate 5. Neuropsych: This patient is capable of making decisions on her own behalf. 6. Skin/Wound Care: Routine pressure relief measures. Nephro for supplement to help promote healing. Monitor drainage right thigh.  7. Fluids/Electrolytes/Nutrition: Strict I/Os with daily weights. Renal/CM diet with 1200 FR. 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  8. T2DM: Monitor BS ac/hs. Continue Levemir daily.   Relatively controlled on 1/24 9. ESRD: Continue HD MWF --schedule at end of day to help with activity tolerance and therapy. 10. Asthma: Respiratory status stable on Dulera bid.  11. PAD s/p FPBG with right great toe amputation:  Monitor wounds daily. Monitor drainage from proximal incisions. 12. Anxiety disorder: Managed with diazepam bid prn. 13. Diastolic CHF/HTN: Monitor BP bid. Continue Isordil bid. On lipitor at bedtime. Hydralazine on hold.  Filed Weights   08/18/17 1815 08/18/17 2230 08/19/17 0500  Weight: 68.8 kg (151 lb 10.8 oz) 65.4 kg (144 lb 2.9 oz) 66.1 kg (145 lb 11.6 oz)  14. Acute on chronic anemia: On aranesp MWF with HD.    Hb 10.2 on 1/24   Cont to monitor 15. HTN   Cont meds   Slightly labile with HD, recs per Nephro 16. Leukocytosis   WBCs 12.0 on 1/24   Afebrile   Cont to monitor      LOS (Days) 3 A FACE TO FACE EVALUATION WAS PERFORMED  Treyvone Chelf Lorie Phenix 08/19/2017 8:36 AM

## 2017-08-19 NOTE — Evaluation (Signed)
Recreational Therapy Assessment and Plan  Patient Details  Name: Destiny Day MRN: [id number] Date of Birth: 1951-01-05 Today's Date: 08/19/2017  Rehab Potential: Good ELOS: 7 days   Assessment  Problem List:      Patient Active Problem List   Diagnosis Date Noted  . Generalized anxiety disorder   . Debility 08/16/2017  . Amputated great toe of right foot ([place])   . Amputated toe of left foot ([place])   . Post-operative pain   . ESRD on dialysis ([place])   . Diabetes mellitus ([place])   . Anxiety state   . Benign essential HTN   . Acute blood loss anemia   . Anemia of chronic disease   . Idiopathic chronic venous hypertension of both lower extremities with inflammation 10/13/2016  . S/P transmetatarsal amputation of foot, left ([place]) 09/21/2016  . Onychomycosis 08/15/2016  . Gangrene of toe of left foot ([place])   . PAD (peripheral artery disease) ([place]) 07/08/2016  . Atherosclerosis of native artery of left lower extremity with gangrene ([place]) 06/23/2016  . GERD (gastroesophageal reflux disease) 10/07/2015  . ARF (acute renal failure) ([place])   . Hypothermia 06/12/2015  . Acute on chronic renal failure ([place]) 06/12/2015  . CHF (congestive heart failure) ([place]) 07/30/2014  . CKD (chronic kidney disease) stage 4, GFR 15-29 ml/min (HCC) 06/27/2014  . Hypertensive heart disease 05/25/2013  . CKD (chronic kidney disease) stage 3, GFR 30-59 ml/min (HCC) 05/25/2013  . Pulmonary edema 05/23/2013  . Type 2 diabetes mellitus with diabetic nephropathy ([place]) 05/23/2013  . Type 2 diabetes mellitus with hyperosmolar nonketotic hyperglycemia ([place]) 04/13/2013  . Chronic diastolic CHF (congestive heart failure) ([place]) 04/13/2013  . UGI bleed 03/31/2013  . Hypokalemia 08/24/2012  . Type II or unspecified type diabetes mellitus without mention of complication, not stated as uncontrolled 12/27/2007  . HLD (hyperlipidemia) 12/27/2007  . ALLERGIC RHINITIS 12/27/2007  . Asthma 12/27/2007     Past Medical History:      Past Medical History:  Diagnosis Date  . Abdominal bruit   . Anemia   . Anxiety   . Arthritis    Osteoarthritis  . Asthma   . Cervical disc disease    "pinced nerve"  . CHF (congestive heart failure) ([place])   . Complication of anesthesia    " after I got home from my last procedure, I started itching."  . Diabetes mellitus    Type II  . Diverticulitis   . ESRD on peritoneal dialysis [facility])    Hemodialysis - MWF- [place]   . GERD (gastroesophageal reflux disease)    from medications  . GI bleed 03/31/2013  . History of hiatal hernia   . Hyperlipidemia   . Hypertension   . Neuropathy    left leg  . Osteoporosis   . Peripheral vascular disease ([place])   . Pneumonia    "very young"  . Seasonal allergies   . Shortness of breath dyspnea    WIth exertion  . Sleep apnea    can't afford cpap   Past Surgical History:       Past Surgical History:  Procedure Laterality Date  . A/V SHUNTOGRAM N/A 09/22/2016   Procedure: A/V Shuntogram - left arm;  Surgeon: Serafina Mitchell, MD;  Location: [place] [facility] LAB;  Service: Cardiovascular;  Laterality: N/A;  . ABDOMINAL HYSTERECTOMY  1993`  . AMPUTATION Left 09/01/2016   Procedure: LEFT FOOT TRANSMETATARSAL AMPUTATION;  Surgeon: Newt Minion, MD;  Location: [place];  Service: Orthopedics;  Laterality: Left;  . AMPUTATION Right 08/11/2017   Procedure: RIGHT GREAT TOE AMPUTATION DIGIT;  Surgeon: Rosetta Posner, MD;  Location: [facility];  Service: Vascular;  Laterality: Right;  . AV FISTULA PLACEMENT Left 04/21/2016   Procedure: INSERTION OF ARTERIOVENOUS (AV) GORE-TEX GRAFT ARM LEFT;  Surgeon: Elam Dutch, MD;  Location: [place];  Service: Vascular;  Laterality: Left;  . [place] TRANSPOSITION Left 07/10/2014   Procedure: BASCILIC VEIN TRANSPOSITION;  Surgeon: Angelia Mould, MD;  Location: [place];  Service: Vascular;  Laterality: Left;  . BASCILIC VEIN  TRANSPOSITION Right 11/08/2014   Procedure: FIRST STAGE BASILIC VEIN TRANSPOSITION;  Surgeon: Angelia Mould, MD;  Location: [place];  Service: Vascular;  Laterality: Right;  . BASCILIC VEIN TRANSPOSITION Right 01/18/2015   Procedure: SECOND STAGE BASILIC VEIN TRANSPOSITION;  Surgeon: Angelia Mould, MD;  Location: [facility] OR;  Service: Vascular;  Laterality: Right;  . ESOPHAGOGASTRODUODENOSCOPY N/A 03/31/2013   Procedure: ESOPHAGOGASTRODUODENOSCOPY (EGD);  Surgeon: Gatha Mayer, MD;  Location: [facility] ENDOSCOPY;  Service: Endoscopy;  Laterality: N/A;  . EYE SURGERY     laser surgery  . FEMORAL-POPLITEAL BYPASS GRAFT Left 07/08/2016   Procedure: LEFT  FEMORAL-BELOW KNEE POPLITEAL ARTERY BYPASS GRAFT USING 6MM X 80 CM PROPATEN GORETEX GRAFT WITH RINGS.;  Surgeon: Rosetta Posner, MD;  Location: [place];  Service: Vascular;  Laterality: Left;  . FEMORAL-POPLITEAL BYPASS GRAFT Right 08/11/2017   Procedure: RIGHT FEMORAL TO BELOW KNEE POPLITEAL ARTERKY  BYPASS GRAFT USING 6MM RINGED PROPATEN GRAFT;  Surgeon: Rosetta Posner, MD;  Location: [place];  Service: Vascular;  Laterality: Right;  . FISTULOGRAM Left 10/29/2014   Procedure: FISTULOGRAM;  Surgeon: Angelia Mould, MD;  [facility] LAB;  Service: Cardiovascular;  Laterality: Left;  . LIGATION OF ARTERIOVENOUS  FISTULA Left 04/21/2016   Procedure: LIGATION OF ARTERIOVENOUS  FISTULA LEFT ARM;  Surgeon: Elam Dutch, MD;  Location: [place];  Service: Vascular;  Laterality: Left;  . LOWER EXTREMITY ANGIOGRAPHY N/A 04/06/2017   Procedure: Lower Extremity Angiography - Right;  Surgeon: Serafina Mitchell, MD;  Location: [place] [facility] LAB;  Service: Cardiovascular;  Laterality: N/A;  . PATCH ANGIOPLASTY Right 01/18/2015   Procedure: BASILIC VEIN PATCH ANGIOPLASTY USING VASCUGUARD PATCH;  Surgeon: Angelia Mould, MD;  Location: [place];  Service: Vascular;  Laterality: Right;  . PERIPHERAL VASCULAR BALLOON ANGIOPLASTY  09/22/2016    Procedure: Peripheral Vascular Balloon Angioplasty;  Surgeon: Serafina Mitchell, MD;  Location: [place] [facility] LAB;  Service: Cardiovascular;;  Lt. Fistula  . PERIPHERAL VASCULAR CATHETERIZATION N/A 06/23/2016   Procedure: Abdominal Aortogram w/Lower Extremity;  Surgeon: Serafina Mitchell, MD;  Location: [place] [facility] LAB;  Service: Cardiovascular;  Laterality: N/A;  . PERIPHERAL VASCULAR CATHETERIZATION  06/23/2016   Procedure: Peripheral Vascular Intervention;  Surgeon: Serafina Mitchell, MD;  Location: [place] [facility] LAB;  Service: Cardiovascular;;  lt common and external illiac artery    Assessment & Plan Clinical Impression: Lenka Zhao [place] a 67 y.o.femalewith history of ESRD- HD, diastolic congestive heart failure, diabetes mellitus, HTN, PVD s/p 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 History taken from chrat review and patient. She was admitted on 08/11/17 with ischemic right foot with gangrenous changes and underwent right femoral to below knee BG and right great toe amputation by Dr. Donnetta Hutching. Post op WBAT. She has had some drainage from right thigh incisions and reactive leucocytosis resolving. ABLA being monitored and continues on aranesp for anemia of chronic disease.  Therapy ongoing and patient with functional deficits. CIR recommended for follow up therapy.  Patient transferred to CIR on 08/16/2017 .    Pt presents with decreased activity tolerance, decreased functional mobility, decreased balanceLimiting pt's independence with leisure/community pursuits.   Leisure History/Participation Premorbid leisure interest/current participation: Crafts - Knitting/Crocheting  Plan Rec Therapy Plan Is patient appropriate for Therapeutic Recreation?: Yes Rehab Potential: Good Treatment times per week: Min 1 TR session/group >20 minutes during LOS Estimated Length of Stay: 7 days TR Treatment/Interventions: Adaptive equipment instruction;Group participation (Comment);Therapeutic  exercise;1:1 session;Community reintegration;Recreation/leisure participation;UE/LE Coordination activities;Balance/vestibular training;Functional mobility training;Patient/family education;Therapeutic activities  Recommendations for other services: None   Discharge Criteria: Patient will be discharged from TR if patient refuses treatment 3 consecutive times without medical reason.  If treatment goals not met, if there is a change in medical status, if patient makes no progress towards goals or if patient is discharged from hospital.  The above assessment, treatment plan, treatment alternatives and goals were discussed and mutually agreed upon: by patient  [place] 08/19/2017, 11:55 AM   Session notes:  Session also focused on activity analysis with potential modifications and community reintegration.

## 2017-08-19 NOTE — Progress Notes (Signed)
Physical Therapy Session Note  Patient Details  Name: ARSHIA SPELLMAN MRN: [id number] Date of Birth: 02-27-1951  Today's Date: 08/19/2017 PT Individual Time: 1330-1445 PT Individual Time Calculation (min): 75 min   Short Term Goals: Week 1:  PT Short Term Goal 1 (Week 1): =LTG  Skilled Therapeutic Interventions/Progress Updates:   Pt seated EOB and agreeable to therapy, c/o pain w/ activity as listed below. Pain primarily in R groin/upper thigh area at incision site, pain subsides w/ rest. Focused on tolerance to OOB activity, standing, and functional tasks this session. Ambulated 30' and 40' w/ min guard using RW. 1 seated rest break 2/2 fatigue and 1 LOB corrected w/ min assist. Performed cognitive task in standing w/ close supervision and unilateral UE support for 2-3 min at a time, seated rest 2/2 fatigue/pain. Finished rest of task in seated w/ mod-max verbal cues for completion. Pt expressing concern over cognitive level, she reports her cognition has declined since she came into the hospital most recently, she states "something is not right in my head". Discussed how this impacts her function, she occasionally requires min cues for sequencing and technique w/ mobility, and requires min-mod cues for problem solving mobility around home environment. Will follow up w/ treatment team regarding possible speech evaluation referral as pt reports this is a recent onset. Returned to room and ended session in w/c, call bell within reach and all needs met.    Therapy Documentation Precautions:  Precautions Precautions: Fall Other Brace/Splint: Darco shoe on Rt Restrictions Weight Bearing Restrictions: Yes RLE Weight Bearing: Weight bearing as tolerated Vital Signs: Therapy Vitals Temp: 98.6 F (37 C) Temp Source: Oral Pulse Rate: (!) 103 Resp: 18 BP: (!) 156/84 Patient Position (if appropriate): Sitting Oxygen Therapy SpO2: 100 % O2 Device: Not Delivered Pain: Pain Assessment Pain  Assessment: 0-10 Pain Score: 5  Pain Location: Leg Pain Orientation: Right Pain Onset: With Activity  See Function Navigator for Current Functional Status.   Therapy/Group: Individual Therapy  Saharah Sherrow K Arnette 08/19/2017, 2:50 PM

## 2017-08-19 NOTE — Patient Care Conference (Signed)
Inpatient RehabilitationTeam Conference and Plan of Care Update Date: 08/18/2017   Time: 2:40 PM    Patient Name: Destiny Day      Medical Record Number: [id number]  Date of Birth: 08-19-1950 Sex: Female         Room/Bed: 4M12C/4M12C-01 Payor Info: Payor: MEDICARE / Plan: MEDICARE PART A AND B / Product Type: *No Product type* /    Admitting Diagnosis: Debility  Admit Date/Time:  08/16/2017  3:34 PM Admission Comments: No comment available   Primary Diagnosis:  <principal problem not specified> Principal Problem: <principal problem not specified>  Patient Active Problem List   Diagnosis Date Noted  . Leukocytosis   . Labile blood pressure   . Generalized anxiety disorder   . Debility 08/16/2017  . Amputated great toe of right foot ([place])   . Amputated toe of left foot ([place])   . Post-operative pain   . ESRD on dialysis ([place])   . Diabetes mellitus ([place])   . Anxiety state   . Benign essential HTN   . Acute blood loss anemia   . Anemia of chronic disease   . Idiopathic chronic venous hypertension of both lower extremities with inflammation 10/13/2016  . S/P transmetatarsal amputation of foot, left ([place]) 09/21/2016  . Onychomycosis 08/15/2016  . Gangrene of toe of left foot ([place])   . PAD (peripheral artery disease) ([place]) 07/08/2016  . Atherosclerosis of native artery of left lower extremity with gangrene ([place]) 06/23/2016  . GERD (gastroesophageal reflux disease) 10/07/2015  . ARF (acute renal failure) ([place])   . Hypothermia 06/12/2015  . Acute on chronic renal failure ([place]) 06/12/2015  . CHF (congestive heart failure) ([place]) 07/30/2014  . CKD (chronic kidney disease) stage 4, GFR 15-29 ml/min (HCC) 06/27/2014  . Hypertensive heart disease 05/25/2013  . CKD (chronic kidney disease) stage 3, GFR 30-59 ml/min (HCC) 05/25/2013  . Pulmonary edema 05/23/2013  . Type 2 diabetes mellitus with diabetic nephropathy ([place]) 05/23/2013  . Type 2 diabetes mellitus with hyperosmolar  nonketotic hyperglycemia ([place]) 04/13/2013  . Chronic diastolic CHF (congestive heart failure) ([place]) 04/13/2013  . UGI bleed 03/31/2013  . Hypokalemia 08/24/2012  . Type II or unspecified type diabetes mellitus without mention of complication, not stated as uncontrolled 12/27/2007  . HLD (hyperlipidemia) 12/27/2007  . ALLERGIC RHINITIS 12/27/2007  . Asthma 12/27/2007    Expected Discharge Date: Expected Discharge Date: 08/24/17  Team Members Present: Physician leading conference: Dr. Delice Lesch Social Worker Present: Lennart Pall, LCSW Nurse Present: Rayetta Pigg, RN PT Present: Roderic Ovens, PT OT Present: Willeen Cass, OT SLP Present: Windell Moulding, SLP     Current Status/Progress Goal Weekly Team Focus  Medical   Deficits with mobility, transfers, ADLs secondary to RLE 1st digit amputation with history of left TMA  Improve mobility, DM/BP, WBCs  See above   Bowel/Bladder   Continent of Bladder( Oliguria) and Bowel after several laxative LBMx3 08/18/17  Moderate assist x1,Remain Continent of Bladder/bowel while on Rehab  Assess and address Bowel elimination QS and prn, administer laxative and stool softener daily , prn and if No BM within 2 days.. ..   Swallow/Nutrition/ Hydration             ADL's   min - mod A LB self care, ADL transfers  mod I BADLs  activity tolerance, ADL training, functional mobility, pt education   Mobility   min/mod A transfers, min A gait, mod A stairs  mod I overall  activity tolerance, stairs, functional mobility  Communication             Safety/Cognition/ Behavioral Observations  Alert oriented x4, able to make her needs known, High Risk for Fall s/p surgery this admission No reported falls  Mod assist, No fall while on Rehab  Maintain all safety measures and educate patient, and family r/t precautionary measures to prevent falls   Pain   No report of pain   < 3   Assess QS and PRN administered meds per orders    Skin   Surgical  incision to right foot, and leg , s/p Fem pop. healing well, Dressing changed o 08/17/17. Left partial foot amputation  Prevent onset infection or surgical complications    Assess, monitor and document issues related to surgical intervention and new onset of skin issues. Educate family related to care of surgical wounds     Rehab Goals Patient on target to meet rehab goals: Yes *See Care Plan and progress notes for long and short-term goals.     Barriers to Discharge  Current Status/Progress Possible Resolutions Date Resolved   Physician    Medical stability;Hemodialysis;Wound Care;Decreased caregiver support;Lack of/limited family support     See above  Therapies, optimize BP/DM meds, follow labs      Nursing                  PT  Inaccessible home environment  split level home              OT                  SLP                SW                Discharge Planning/Teaching Needs:  Pt to d/c home with spouse who can provide intermittent support.  Spouse does work two p/t jobs and he is "in and out" of the home during the day.  Teaching needs to be determined.   Team Discussion:  Monitoring WBC;  WBAT on RLE;  Slightly dizzy with standing today.  Steady assist on level surfaces but concern with uneven.  Pt will need to get out of the home at mod ind and to transport [place].  Mod ind goals overall.  Revisions to Treatment Plan:  None    Continued Need for Acute Rehabilitation Level of Care: The patient requires daily medical management by a physician with specialized training in physical medicine and rehabilitation for the following conditions: Daily direction of a multidisciplinary physical rehabilitation program to ensure safe treatment while eliciting the highest outcome that is of practical value to the patient.: Yes Daily medical management of patient stability for increased activity during participation in an intensive rehabilitation regime.: Yes Daily analysis of laboratory values  and/or radiology reports with any subsequent need for medication adjustment of medical intervention for : Post surgical problems;Renal problems;Wound care problems;Diabetes problems;Blood pressure problems  Jakwan Sally 08/19/2017, 11:17 AM

## 2017-08-19 NOTE — Progress Notes (Signed)
Subjective: Interval History: having frequent nausea after taking her pills, going on for days per husband  Objective: Vital signs in last 24 hours: Temp:  [97.8 F (36.6 C)-98.8 F (37.1 C)] 98.4 F (36.9 C) (01/24 0443) Pulse Rate:  [48-130] 93 (01/24 0640) Resp:  [18-20] 18 (01/24 0443) BP: (114-199)/(58-127) 134/66 (01/24 0640) SpO2:  [98 %-99 %] 98 % (01/23 2230) Weight:  [65.4 kg (144 lb 2.9 oz)-68.8 kg (151 lb 10.8 oz)] 66.1 kg (145 lb 11.6 oz) (01/24 0500) Weight change: -0.8 kg (-12.2 oz)  Intake/Output from previous day: 01/23 0701 - 01/24 0700 In: 702 [P.O.:702] Out: 3000  Intake/Output this shift: No intake/output data recorded.  General appearance: alert, cooperative and no distress Resp: clear to auscultation bilaterally Cardio: S1, S2 normal and systolic murmur: systolic ejection 2/6 GI: soft, non-tender; bowel sounds normal; no masses,  no organomegaly Extremities: avf ok  Lab Results: Recent Labs    08/19/17 0710  WBC 12.0*  HGB 10.2*  HCT 31.8*  PLT 374   BMET:  Recent Labs    08/19/17 0710  NA 133*  K 4.0  CL 92*  CO2 29  GLUCOSE 137*  BUN 12  CREATININE 3.51*  CALCIUM 9.4   No results for input(s): PTH in the last 72 hours. Iron Studies: No results for input(s): IRON, TIBC, TRANSFERRIN, FERRITIN in the last 72 hours.  Studies/Results: No results found.  I have reviewed the patient's current medications.  Dialysis Orders:  [facility] [place]   4h   66.5kg  3K/ 2.25   Profile 4   LUA AVG   Hep 2500 , 1000 midrun Venofer 50mg  IV qwk - last 1/11 Mircera 45mcg IV q2wks - last 1/09   Assessment: 1. Nausea/ vomiting - after taking her pills. Meds reviewed, likely due to phos binders, will hold both for now 2. SP R fem-pop bypass & R great toe amputation, Dr. Oneida Alar, 1/16  3. ESRD HD MWF 4. HTN stable 5. Vol +3kg today 6. Anemia of CKD - check Hb, darbe 150 q mon 7. DM  Plan - HD Fri, hold binders   LOS: 3 days   Sandy Salaam  Derrill Bagnell 08/19/2017,11:18 AM

## 2017-08-19 NOTE — Progress Notes (Signed)
Physical Therapy Session Note  Patient Details  Name: Destiny Day MRN: [id number] Date of Birth: 1951-03-13  Today's Date: 08/19/2017 PT Individual Time: 1000-1045 PT Individual Time Calculation (min): 45 min   Short Term Goals: Week 1:  PT Short Term Goal 1 (Week 1): =LTG  Skilled Therapeutic Interventions/Progress Updates:   Pt received sitting in WC and agreeable to PT. PT transported pt to rehab gym in [facility]. Pt reports increased fatigue on this day due to restless night. Pt isntructed in blocked practice sit<>stand x 5 with min assist. Gait training x 15f with RW and min assist. Pt reports Nausea following gait training with emesis episode. [facility] and adminstered medicait[facility]. Pt returned to bed with max assist +2 with assist from PT and husband. Left supine in bed with call bell in reach and all needs met.       Therapy Documentation Precautions:  Precautions Precautions: Fall Other Brace/Splint: Darco shoe on Rt Restrictions Weight Bearing Restrictions: Yes RLE Weight Bearing: Weight bearing as tolerated Vital Signs: Therapy Vitals Temp: 98.6 F (37 C) Temp Source: Oral Pulse Rate: (!) 103 Resp: 18 BP: (!) 156/84 Patient Position (if appropriate): Sitting Oxygen Therapy SpO2: 100 % O2 Device: Not Delivered Pain: Pain Assessment Pain Assessment: 0-10 Pain Score: 10-Worst pain ever Pain Type: Surgical pain Pain Location: Leg Pain Orientation: Right Pain Radiating Towards: groin Pain Frequency: Occasional Pain Onset: With Activity Pain Intervention(s): Medication (See eMAR)   See Function Navigator for Current Functional Status.   Therapy/Group: Individual Therapy  ALorie Phenix1/24/2019, 5:24 PM

## 2017-08-19 NOTE — Progress Notes (Signed)
Social Work  Social Work Assessment and Plan  Patient Details  Name: Destiny Day MRN: [id number] Date of Birth: 19-Mar-1951  Today's Date: 08/19/2017  Problem List:  Patient Active Problem List   Diagnosis Date Noted  . Leukocytosis   . Labile blood pressure   . Generalized anxiety disorder   . Debility 08/16/2017  . Amputated great toe of right foot ([place])   . Amputated toe of left foot ([place])   . Post-operative pain   . ESRD on dialysis ([place])   . Diabetes mellitus ([place])   . Anxiety state   . Benign essential HTN   . Acute blood loss anemia   . Anemia of chronic disease   . Idiopathic chronic venous hypertension of both lower extremities with inflammation 10/13/2016  . S/P transmetatarsal amputation of foot, left ([place]) 09/21/2016  . Onychomycosis 08/15/2016  . Gangrene of toe of left foot ([place])   . PAD (peripheral artery disease) ([place]) 07/08/2016  . Atherosclerosis of native artery of left lower extremity with gangrene ([place]) 06/23/2016  . GERD (gastroesophageal reflux disease) 10/07/2015  . ARF (acute renal failure) ([place])   . Hypothermia 06/12/2015  . Acute on chronic renal failure ([place]) 06/12/2015  . CHF (congestive heart failure) ([place]) 07/30/2014  . CKD (chronic kidney disease) stage 4, GFR 15-29 ml/min (HCC) 06/27/2014  . Hypertensive heart disease 05/25/2013  . CKD (chronic kidney disease) stage 3, GFR 30-59 ml/min (HCC) 05/25/2013  . Pulmonary edema 05/23/2013  . Type 2 diabetes mellitus with diabetic nephropathy ([place]) 05/23/2013  . Type 2 diabetes mellitus with hyperosmolar nonketotic hyperglycemia ([place]) 04/13/2013  . Chronic diastolic CHF (congestive heart failure) ([place]) 04/13/2013  . UGI bleed 03/31/2013  . Hypokalemia 08/24/2012  . Type II or unspecified type diabetes mellitus without mention of complication, not stated as uncontrolled 12/27/2007  . HLD (hyperlipidemia) 12/27/2007  . ALLERGIC RHINITIS 12/27/2007  . Asthma 12/27/2007   Past Medical  History:  Past Medical History:  Diagnosis Date  . Abdominal bruit   . Anemia   . Anxiety   . Arthritis    Osteoarthritis  . Asthma   . Cervical disc disease    "pinced nerve"  . CHF (congestive heart failure) ([place])   . Complication of anesthesia    " after I got home from my last procedure, I started itching."  . Diabetes mellitus    Type II  . Diverticulitis   . ESRD on peritoneal dialysis [facility])    Hemodialysis - MWF- [place]   . GERD (gastroesophageal reflux disease)    from medications  . GI bleed 03/31/2013  . History of hiatal hernia   . Hyperlipidemia   . Hypertension   . Neuropathy    left leg  . Osteoporosis   . Peripheral vascular disease ([place])   . Pneumonia    "very young"  . Seasonal allergies   . Shortness of breath dyspnea    WIth exertion  . Sleep apnea    can't afford cpap   Past Surgical History:  Past Surgical History:  Procedure Laterality Date  . A/V SHUNTOGRAM N/A 09/22/2016   Procedure: A/V Shuntogram - left arm;  Surgeon: Serafina Mitchell, MD;  Location: [place];  Service: Cardiovascular;  Laterality: N/A;  . ABDOMINAL HYSTERECTOMY  1993`  . AMPUTATION Left 09/01/2016   Procedure: LEFT FOOT TRANSMETATARSAL AMPUTATION;  Surgeon: Newt Minion, MD;  Location: [facility];  Service: Orthopedics;  Laterality: Left;  . AMPUTATION Right 08/11/2017  Procedure: RIGHT GREAT TOE AMPUTATION DIGIT;  Surgeon: Rosetta Posner, MD;  Location: [facility] OR;  Service: Vascular;  Laterality: Right;  . AV FISTULA PLACEMENT Left 04/21/2016   Procedure: INSERTION OF ARTERIOVENOUS (AV) GORE-TEX GRAFT ARM LEFT;  Surgeon: Elam Dutch, MD;  Location: [facility];  Service: Vascular;  Laterality: Left;  . [place] TRANSPOSITION Left 07/10/2014   Procedure: BASCILIC VEIN TRANSPOSITION;  Surgeon: Angelia Mould, MD;  Location: [facility];  Service: Vascular;  Laterality: Left;  . BASCILIC VEIN TRANSPOSITION Right 11/08/2014   Procedure: FIRST STAGE BASILIC VEIN TRANSPOSITION;   Surgeon: Angelia Mould, MD;  Location: [facility];  Service: Vascular;  Laterality: Right;  . BASCILIC VEIN TRANSPOSITION Right 01/18/2015   Procedure: SECOND STAGE BASILIC VEIN TRANSPOSITION;  Surgeon: Angelia Mould, MD;  Location[facility];  Service: Vascular;  Laterality: Right;  . ESOPHAGOGASTRODUODENOSCOPY N/A 03/31/2013   Procedure: ESOPHAGOGASTRODUODENOSCOPY (EGD);  Surgeon: Gatha Mayer, MD;  Location: [facility] ENDOSCOPY;  Service: Endoscopy;  Laterality: N/A;  . EYE SURGERY     laser surgery  . FEMORAL-POPLITEAL BYPASS GRAFT Left 07/08/2016   Procedure: LEFT  FEMORAL-BELOW KNEE POPLITEAL ARTERY BYPASS GRAFT USING 6MM X 80 CM PROPATEN GORETEX GRAFT WITH RINGS.;  Surgeon: Rosetta Posner, MD;  Location: [facility];  Service: Vascular;  Laterality: Left;  . FEMORAL-POPLITEAL BYPASS GRAFT Right 08/11/2017   Procedure: RIGHT FEMORAL TO BELOW KNEE POPLITEAL ARTERKY  BYPASS GRAFT USING 6MM RINGED PROPATEN GRAFT;  Surgeon: Rosetta Posner, MD;  Location: [facility];  Service: Vascular;  Laterality: Right;  . FISTULOGRAM Left 10/29/2014   Procedure: FISTULOGRAM;  Surgeon: Angelia Mould, MD;  Location: [facility] CATH LAB;  Service: Cardiovascular;  Laterality: Left;  . LIGATION OF ARTERIOVENOUS  FISTULA Left 04/21/2016   Procedure: LIGATION OF ARTERIOVENOUS  FISTULA LEFT ARM;  Surgeon: Elam Dutch, MD;  Location: [facility];  Service: Vascular;  Laterality: Left;  . LOWER EXTREMITY ANGIOGRAPHY N/A 04/06/2017   Procedure: Lower Extremity Angiography - Right;  Surgeon: Serafina Mitchell, MD;  Location: [place] [facility];  Service: Cardiovascular;  Laterality: N/A;  . PATCH ANGIOPLASTY Right 01/18/2015   Procedure: BASILIC VEIN PATCH ANGIOPLASTY USING VASCUGUARD PATCH;  Surgeon: Angelia Mould, MD;  Location: [facility] [facility];  Service: Vascular;  Laterality: Right;  . PERIPHERAL VASCULAR BALLOON ANGIOPLASTY  09/22/2016   Procedure: Peripheral Vascular Balloon Angioplasty;  Surgeon: Serafina Mitchell, MD;  Location: [place] [facility];  Service: Cardiovascular;;  Lt. Fistula  . PERIPHERAL VASCULAR CATHETERIZATION N/A 06/23/2016   Procedure: Abdominal Aortogram w/Lower Extremity;  Surgeon: Serafina Mitchell, MD;  Location: [facility];  Service: Cardiovascular;  Laterality: N/A;  . PERIPHERAL VASCULAR CATHETERIZATION  06/23/2016   Procedure: Peripheral Vascular Intervention;  Surgeon: Serafina Mitchell, MD;  Location: [place] [facility];  Service: Cardiovascular;;  lt common and external illiac artery   Social History:  reports that she quit smoking about 5 years ago. Her smoking use included cigarettes. She has a 14.00 pack-year smoking history. she has never used smokeless tobacco. She reports that she does not drink alcohol or use drugs.  Family / Support Systems Marital Status: Married Patient Roles: Spouse, Parent Spouse/Significant Other: Luara Faye @ (C) [phone] Children: daughter, Gladstone Lighter (local) @ [phone];  son, Lachanda Buczek ([place]) @ (C) [phone] and two other sons living out of town Anticipated Caregiver: spouse Ability/Limitations of Caregiver: Pt spouse works two p/t jobs Building control surveyor Availability: Intermittent Family Dynamics: Patient's spouse  at bedside and very supportive and encouraging the patient.  Patient reports that her children check on her regularly and are very supportive  Social History Preferred language: English Religion: Christian Cultural Background: NA Read: Yes Write: Yes Employment Status: Disabled Freight forwarder Issues: None Guardian/Conservator: None -Per MD, patient is capable of making decisions on her own behalf   Abuse/Neglect Abuse/Neglect Assessment Can Be Completed: Yes Physical Abuse: Denies Verbal Abuse: Denies Sexual Abuse: Denies Exploitation of patient/patient's resources: Denies Self-Neglect: Denies  Emotional Status Pt's affect, behavior adn adjustment status: Patient sitting up in wheelchair and does  report fatigue from full day of therapies, however, she is able to complete this assessment and interview without any difficulty.  She denies any significant emotional distress.  Reports she feels motivated for therapies and hopeful she will be able to regain her independence.  We will monitor mood and refer for neuropsychology as indicated. Recent Psychosocial Issues: Ongoing health issues requiring frequent medical visits and past hospitalizations. Pyschiatric History: None Substance Abuse History: None  Patient / Family Perceptions, Expectations & Goals Pt/Family understanding of illness & functional limitations: Patient and spouse with good understanding of surgery performed and of current functional limitations/need for CIR. Premorbid pt/family roles/activities: Patient was modified independent in the home PTA and has been using [facility] for HD appointments. Anticipated changes in roles/activities/participation: Minimal change anticipated if patient able to reach modified independent goals. Pt/family expectations/goals: "I need to be able to safely get out of my house and down to the [facility] bus."  [employer]: Other (Comment)([facility] for HD transport) Premorbid Home Care/DME Agencies: Other (Comment)([facility]) Transportation available at discharge: Yes  Discharge Planning Living Arrangements: Spouse/significant other Support Systems: Spouse/significant other Type of Residence: Private residence Insurance Resources: [employer] Financial Resources: [employer] Financial Screen Referred: No Living Expenses: Higher education careers adviser Management: Spouse Does the patient have any problems obtaining your medications?: No Home Management: Spouse does majority of home management. Patient/Family Preliminary Plans: Patient to return home with husband who does work 2 part-time jobs and home intermittently during the day Social Work Anticipated Follow Up Needs: HH/OP Expected length of stay:  7-10 days  Clinical Impression  Pleasant woman on CIR following RLE bypass and toe amputation.  Spouse at bedside and supportive and both hopeful she can reach mod independent goals.  Pt's primary goal is to be able to walk out of home and safely to [facility] bus which takes her to HD.  Pt denies any significant emotional distress but will monitor.  Will follow for support and d/c planning needs.    Burnice Oestreicher 08/19/2017, 3:27 PM

## 2017-08-19 NOTE — Care Management Note (Signed)
Inpatient [facility] Individual Statement of Services  Patient Name:  Destiny Day  Date:  08/19/2017  Welcome to the [facility].  Our goal is to provide you with an individualized program based on your diagnosis and situation, designed to meet your specific needs.  With this comprehensive rehabilitation program, you will be expected to participate in at least 3 hours of rehabilitation therapies Monday-Friday, with modified therapy programming on the weekends.  Your rehabilitation program will include the following services:  Physical Therapy (PT), Occupational Therapy (OT), 24 hour per day rehabilitation nursing, Therapeutic Recreaction (TR), Case Management (Social Worker), Rehabilitation Medicine, Nutrition Services and Pharmacy Services  Weekly team conferences will be held on Wednesdays to discuss your progress.  Your Social Worker will talk with you frequently to get your input and to update you on team discussions.  Team conferences with you and your family in attendance may also be held.  Expected length of stay: 7-10 days    Overall anticipated outcome: modified independent  Depending on your progress and recovery, your program may change. Your Social Worker will coordinate services and will keep you informed of any changes. Your Social Worker's name and contact numbers are listed  below.  The following services may also be recommended but are not provided by the [facility] will be made to provide these services after discharge if needed.  Arrangements include referral to agencies that provide these services.  Your insurance has been verified to be:  Medicare and [employer] Your primary doctor is:  [place]  Pertinent information will be shared with your doctor and your insurance company.  Social  Worker:  [place], [place] or [phone]   Information discussed with and copy given to patient by: Lennart Pall, 08/19/2017, 3:44 PM

## 2017-08-20 ENCOUNTER — Inpatient Hospital Stay (HOSPITAL_COMMUNITY): Payer: Medicare Other | Admitting: Occupational Therapy

## 2017-08-20 ENCOUNTER — Inpatient Hospital Stay (HOSPITAL_COMMUNITY): Payer: Medicare Other | Admitting: Physical Therapy

## 2017-08-20 DIAGNOSIS — E119 Type 2 diabetes mellitus without complications: Secondary | ICD-10-CM

## 2017-08-20 LAB — CBC
HCT: 28.8 % — ABNORMAL LOW (ref 36.0–46.0)
Hemoglobin: 9 g/dL — ABNORMAL LOW (ref 12.0–15.0)
MCH: 31.5 pg (ref 26.0–34.0)
MCHC: 31.3 g/dL (ref 30.0–36.0)
MCV: 100.7 fL — ABNORMAL HIGH (ref 78.0–100.0)
Platelets: 417 10*3/uL — ABNORMAL HIGH (ref 150–400)
RBC: 2.86 MIL/uL — ABNORMAL LOW (ref 3.87–5.11)
RDW: 16.8 % — ABNORMAL HIGH (ref 11.5–15.5)
WBC: 11.1 10*3/uL — ABNORMAL HIGH (ref 4.0–10.5)

## 2017-08-20 LAB — GLUCOSE, CAPILLARY
Glucose-Capillary: 172 mg/dL — ABNORMAL HIGH (ref 65–99)
Glucose-Capillary: 85 mg/dL (ref 65–99)
Glucose-Capillary: 243 mg/dL — ABNORMAL HIGH (ref 65–99)

## 2017-08-20 LAB — RENAL FUNCTION PANEL
Albumin: 2.8 g/dL — ABNORMAL LOW (ref 3.5–5.0)
Anion gap: 14 (ref 5–15)
BUN: 26 mg/dL — ABNORMAL HIGH (ref 6–20)
CO2: 25 mmol/L (ref 22–32)
Calcium: 9.4 mg/dL (ref 8.9–10.3)
Chloride: 89 mmol/L — ABNORMAL LOW (ref 101–111)
Creatinine, Ser: 5.88 mg/dL — ABNORMAL HIGH (ref 0.44–1.00)
GFR calc Af Amer: 8 mL/min — ABNORMAL LOW (ref >60)
GFR calc non Af Amer: 7 mL/min — ABNORMAL LOW (ref >60)
Glucose, Bld: 213 mg/dL — ABNORMAL HIGH (ref 65–99)
Phosphorus: 2.2 mg/dL — ABNORMAL LOW (ref 2.5–4.6)
Potassium: 4 mmol/L (ref 3.5–5.1)
Sodium: 128 mmol/L — ABNORMAL LOW (ref 135–145)

## 2017-08-20 LAB — HEPATITIS B SURFACE ANTIBODY,QUALITATIVE: Hep B S Ab: REACTIVE

## 2017-08-20 LAB — HEPATITIS B CORE ANTIBODY, TOTAL: Hep B Core Total Ab: NEGATIVE

## 2017-08-20 MED ORDER — GABAPENTIN 100 MG PO CAPS
100.0000 mg | ORAL_CAPSULE | Freq: Three times a day (TID) | ORAL | Status: DC
Start: 2017-08-20 — End: 2017-08-23
  Administered 2017-08-20 – 2017-08-22 (×8): 100 mg via ORAL
  Filled 2017-08-20 (×9): qty 1

## 2017-08-20 MED ORDER — HEPARIN SODIUM (PORCINE) 1000 UNIT/ML DIALYSIS: 3000.0000 [IU] | Freq: Once | INTRAMUSCULAR | Status: DC

## 2017-08-20 MED ORDER — HYDROCODONE-ACETAMINOPHEN 5-325 MG PO TABS
ORAL_TABLET | ORAL | Status: AC
  Administered 2017-08-20: 2 via ORAL
  Filled 2017-08-20: qty 2

## 2017-08-20 MED ORDER — DOXERCALCIFEROL 4 MCG/2ML IV SOLN
INTRAVENOUS | Status: AC
  Administered 2017-08-20: 6 ug via INTRAVENOUS
  Filled 2017-08-20: qty 4

## 2017-08-20 NOTE — Progress Notes (Signed)
Subjective: Interval History: hasn't eaten yet today, reporting pain lower R leg, no sob or CP  Objective: Vital signs in last 24 hours: Temp:  [98.3 F (36.8 C)-98.6 F (37 C)] 98.3 F (36.8 C) (01/25 0538) Pulse Rate:  [85-128] 85 (01/25 0538) Resp:  [16-18] 16 (01/25 0538) BP: (117-156)/(50-84) 117/50 (01/25 0538) SpO2:  [97 %-100 %] 97 % (01/25 0809) FiO2 (%):  [21 %] 21 % (01/25 0809) Weight:  [67.8 kg (149 lb 7.6 oz)] 67.8 kg (149 lb 7.6 oz) (01/25 0538) Weight change: -1 kg (-3.3 oz)  Intake/Output from previous day: 01/24 0701 - 01/25 0700 In: 720 [P.O.:720] Out: -  Intake/Output this shift: Total I/O In: 240 [P.O.:240] Out: -   General appearance: alert, cooperative and no distress Resp: clear to auscultation bilaterally Cardio: S1, S2 normal and systolic murmur: systolic ejection 2/6 GI: soft, non-tender; bowel sounds normal; no masses,  no organomegaly Extremities: avf ok, wounds R leg clean, R foot wrapped  Lab Results: Recent Labs    08/19/17 0710  WBC 12.0*  HGB 10.2*  HCT 31.8*  PLT 374   BMET:  Recent Labs    08/19/17 0710  NA 133*  K 4.0  CL 92*  CO2 29  GLUCOSE 137*  BUN 12  CREATININE 3.51*  CALCIUM 9.4   No results for input(s): PTH in the last 72 hours. Iron Studies: No results for input(s): IRON, TIBC, TRANSFERRIN, FERRITIN in the last 72 hours.  Studies/Results: No results found.  I have reviewed the patient's current medications.  Dialysis Orders:  [facility]   4h   66.5kg  3K/ 2.25   Profile 4   LUA AVG   Hep 2500 , 1000 midrun Venofer 50mg  IV qwk - last 1/11 Mircera 46mcg IV q2wks - last 1/09   Assessment: 1. Nausea/ vomiting - holding velphoro and phoslo, see if that helps 2. SP R fem-pop bypass & R great toe amputation, Dr. Oneida Alar, 1/16  3. ESRD HD MWF 4. HTN stable 5. Vol no vol^ on exam, close to dry 6. Anemia of CKD - check Hb, darbe 150 q mon 7. DM  Plan - HD today, holding binders     LOS: 4 days    Sol Blazing 08/20/2017,12:27 PM

## 2017-08-20 NOTE — Progress Notes (Signed)
[facility]  Subjective  - Has new redness upper mid thigh with warmth.  This just started 2 days ago.     Objective (!) 117/50 85 98.3 F (36.8 C) (Oral) 16 97%  Intake/Output Summary (Last 24 hours) at 08/20/2017 1216 Last data filed at 08/20/2017 0830 Gross per 24 hour  Intake 720 ml  Output -  Net 720 ml    Anterior mid 1/3 upper thigh warmth to touch and mild erythema.  Adjacent to the upper vein harvest site. No incisional warmth.  No groin incisional erythema or drainage. Distal below popliteal incision healing well.  Mild pitting edema Great doppler signals PT/DP/Peroneal Right great toe amputation site is healing well without drainage.   Assessment/Planning: 08/11/2017 right fem-pop by pass with Propaent.  The vein was harvested, but not usable.  She has had a WBC 12 for the past few days and her WBC was higher 14 back on 08/14/2017.  She has been Afebrile.  I do not think she has a by pass infection at this time.  I would treat her for a cellulitis.  Warm compress to area PRN and close observation. I removed all the dressings and left them open to air.  I will order a flat bottom shoe to help with balance an comfort per family request.      Roxy Horseman 08/20/2017 12:16 PM --  Laboratory Lab Results: Recent Labs    08/19/17 0710  WBC 12.0*  HGB 10.2*  HCT 31.8*  PLT 374   BMET Recent Labs    08/19/17 0710  NA 133*  K 4.0  CL 92*  CO2 29  GLUCOSE 137*  BUN 12  CREATININE 3.51*  CALCIUM 9.4    COAG Lab Results  Component Value Date   INR 1.05 08/11/2017   INR 1.03 07/07/2016   INR 1.02 10/06/2015   No results found for: PTT

## 2017-08-20 NOTE — Progress Notes (Signed)
Occupational Therapy Session Note  Patient Details  Name: Destiny Day MRN: [id number] Date of Birth: 07-08-1951  Today's Date: 08/20/2017 OT Individual Time: 1000-1110 OT Individual Time Calculation (min): 70 min    Short Term Goals: No short term goals set  Skilled Therapeutic Interventions/Progress Updates:    Pt received in w/c already bathed (from last night) and dressed. She was upset about the pain she was having in her lower R leg. She had been provided with medication from nursing and pain was slowly improving.  She also had R pain in piriformis region. Performed soft tissue massage for a few minutes and then deeper tissue massage with use of tennis ball.  Pt stated she had some relief with this. She did work on sit to stand with S to RW, but was distracted by her pain and concerned that her surgeon has not seen her for f/u in the hospital. PA aware of her concerns and will contact the surgeon.   Pt worked on UE exercise with AROM arm circles, level 2 theraband back exercises, and bicep curls with 2# weights.  Pt has OA in B shoulders so exercises modified to pt.  Pt's next therapist arrived for her next session.  Therapy Documentation Precautions:  Precautions Precautions: Fall Other Brace/Splint: Darco shoe on Rt Restrictions Weight Bearing Restrictions: Yes RLE Weight Bearing: Weight bearing as tolerated    Pain: Pain Assessment Pain Assessment: 0-10 Pain Score: 6  Pain Type: Neuropathic pain Pain Location: Leg Pain Orientation: Right;Lower;Anterior Pain Descriptors / Indicators: Burning Pain Frequency: Other (Comment)(with activity but worse when lying down) Pain Intervention(s): Emotional support ADL: ADL ADL Comments: refer to functional navigator   See Function Navigator for Current Functional Status.   Therapy/Group: Individual Therapy  SAGUIER,JULIA 08/20/2017, 12:26 PM

## 2017-08-20 NOTE — Progress Notes (Signed)
Occupational Therapy Session Note  Patient Details  Name: Destiny Day MRN: [id number] Date of Birth: Aug 06, 1950  Today's Date: 08/20/2017 OT Individual Time: 1100-1152 OT Individual Time Calculation (min): 52 min  Short Term Goals: Week 1:  OT Short Term Goal 1 (Week 1): STGs = LTGs  Skilled Therapeutic Interventions/Progress Updates:    Pt greeted via OT handoff in room. Pt reporting fatigue and increased feelings of stress due to pain with functional movement. Educated pt/spouse on duel influence of mind and body in regards to pain perception. Essentially informing pt that her emotional state can heighten/dampen feelings of pain. She was candid about feeling "confined" in her room, and that she is worried about the state of her health. OT guided her through a visualization exercise with focus on diaphragmatic breathing and progressive muscle relaxation. Pts stress rating pre visualization 9/10, and afterwards 0/10 (she reported no pain at rest). Pt and OT collaboration completed in regards to continued pain/stress mgt strategies to utilize in room, including mindfulness meditation and meaningful music listening while using personal electronic devices. Teach back utilized for understanding. Pt and spouse appeared receptive to education. At end of tx pt was set up for lunch and left with all needs within reach.    Therapy Documentation Precautions:  Precautions Precautions: Fall Other Brace/Splint: Darco shoe on Rt Restrictions Weight Bearing Restrictions: Yes RLE Weight Bearing: Weight bearing as tolerated Pain: Pain Assessment Pain Assessment: 0-10 Pain Score: 6  Pain Type: Neuropathic pain Pain Location: Leg Pain Orientation: Right;Lower;Anterior Pain Descriptors / Indicators: Burning Pain Frequency: Other (Comment)(with activity but worse when lying down) Pain Intervention(s): Emotional support ADL: ADL ADL Comments: refer to functional navigator     See Function  Navigator for Current Functional Status.   Therapy/Group: Individual Therapy  Glenda Kunst A Jatavious Peppard 08/20/2017, 12:37 PM

## 2017-08-20 NOTE — Progress Notes (Signed)
[place] PHYSICAL MEDICINE & REHABILITATION     PROGRESS NOTE  Subjective/Complaints:  Pt seen lying in bed this AM.  She slept well overnight.  Per nursing, pt with concerns about swelling around incision at groin.    ROS: Denies CP, SOB, N/V/D.  Objective: Vital Signs: Blood pressure (!) 117/50, pulse 85, temperature 98.3 F (36.8 C), temperature source Oral, resp. rate 16, height 5\' 5"  (1.651 m), weight 67.8 kg (149 lb 7.6 oz), SpO2 97 %. No results found. Recent Labs    08/19/17 0710  WBC 12.0*  HGB 10.2*  HCT 31.8*  PLT 374   Recent Labs    08/19/17 0710  NA 133*  K 4.0  CL 92*  GLUCOSE 137*  BUN 12  CREATININE 3.51*  CALCIUM 9.4   CBG (last 3)  Recent Labs    08/19/17 1652 08/19/17 2101 08/20/17 0620  GLUCAP 144* 88 172*    Wt Readings from Last 3 Encounters:  08/20/17 67.8 kg (149 lb 7.6 oz)  08/16/17 66 kg (145 lb 8.1 oz)  05/25/17 68.5 kg (151 lb)    Physical Exam:  BP (!) 117/50 (BP Location: Right Arm)   Pulse 85   Temp 98.3 F (36.8 C) (Oral)   Resp 16   Ht 5\' 5"  (1.651 m)   Wt 67.8 kg (149 lb 7.6 oz)   SpO2 97%   BMI 24.87 kg/m  Constitutional: She appears well-developed and well-nourished. No distress.  HENT: Normocephalic and atraumatic.  Eyes: EOM are normal. No discharge.  Cardiovascular: RRR. No JVD. Respiratory: Effort normal and breath sounds normal.  GI: Bowel sounds are normal. She exhibits no distension.  Musculoskeletal: Min edema right shin and right foot. 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  Motor: B/l UE 5/5 proximal to distal LLE: 4+/5 proximal to distal RLE: HF 4-/5, KE 4-/5, ankle dressed (pain inhibition, stable)  Skin: Skin is warm and dry. She is not diaphoretic.  Dressing right foot c/d/i   Right thigh incisions with c/d/i, small indurated area Psychiatric: She has a normal mood and affect. Her behavior is normal. Judgment and thought content normal.    Assessment/Plan: 1. Functional deficits secondary to RLE 1st digit amputation with history of left TMA which require 3+ hours per day of interdisciplinary therapy in a comprehensive inpatient rehab setting. Physiatrist is providing close team supervision and 24 hour management of active medical problems listed below. Physiatrist and rehab team continue to assess barriers to discharge/monitor patient progress toward functional and medical goals.  Function:  Bathing Bathing position   Position: Wheelchair/chair at sink  Bathing parts Body parts bathed by patient: Right arm, Left arm, Chest, Front perineal area, Right upper leg, Left upper leg, Left lower leg, Abdomen, Buttocks Body parts bathed by helper: Back  Bathing assist        Upper Body Dressing/Undressing Upper body dressing   What is the patient wearing?: Pull over shirt/dress     Pull over shirt/dress - Perfomed by patient: Thread/unthread right sleeve, Thread/unthread left sleeve, Put head through opening, Pull shirt over trunk          Upper body assist Assist Level: Set up      Lower Body Dressing/Undressing Lower body dressing   What is the patient wearing?: Underwear, [employer], Shoes, Non-skid slipper socks, Pants Underwear - Performed by patient: Thread/unthread left underwear leg, Pull underwear up/down, Thread/unthread right underwear leg Underwear - Performed by helper: Thread/unthread right  underwear leg Pants- Performed by patient: Thread/unthread right pants leg, Thread/unthread left pants leg, Pull pants up/down           Shoes - Performed by patient: Don/doff left shoe, Fasten left Shoes - Performed by helper: Don/doff right shoe, Fasten right     TED Hose - Performed by patient: Don/doff left TED hose    Lower body assist Assist for lower body dressing: Touching or steadying assistance (Pt > 75%)      Toileting Toileting   Toileting steps completed by patient: Adjust clothing prior to  toileting, Performs perineal hygiene, Adjust clothing after toileting Toileting steps completed by helper: Adjust clothing prior to toileting, Adjust clothing after toileting(per Leann Nickles-Wright, NT)    Toileting assist Assist level: Touching or steadying assistance (Pt.75%)   Transfers Chair/bed transfer   Chair/bed transfer method: Stand pivot Chair/bed transfer assist level: Touching or steadying assistance (Pt > 75%) Chair/bed transfer assistive device: Medical sales representative     Max distance: 40' Assist level: Touching or steadying assistance (Pt > 75%)   Wheelchair   Type: Manual Max wheelchair distance: 50 Assist Level: Supervision or verbal cues  Cognition Comprehension Comprehension assist level: Follows complex conversation/direction with no assist  Expression Expression assist level: Expresses complex ideas: With no assist  Social Interaction Social Interaction assist level: Interacts appropriately with others - No medications needed.  Problem Solving Problem solving assist level: Solves complex 90% of the time/cues < 10% of the time  Memory Memory assist level: Recognizes or recalls 90% of the time/requires cueing < 10% of the time    Medical Problem List and Plan: 1.  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   Cont CIR 2.  DVT Prophylaxis/Anticoagulation: Pharmaceutical: Heparin 3. Pain Management: Hydrocodone prn effective 4. Mood: LCSW to follow for evaluation and support when appropriate 5. Neuropsych: This patient is capable of making decisions on her own behalf. 6. Skin/Wound Care: Routine pressure relief measures. Nephro for supplement to help promote healing.    Incisions healing 7. Fluids/Electrolytes/Nutrition: Strict I/Os with daily weights. Renal/CM diet with 1200 FR. 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  8. T2DM: Monitor BS ac/hs. Continue Levemir daily.   Relatively  controlled on 1/25 9. ESRD: Continue HD MWF --schedule at end of day to help with activity tolerance and therapy. 10. Asthma: Respiratory status stable on Dulera bid.  11. PAD s/p FPBG with right great toe amputation: Monitor wounds daily. Monitor drainage from proximal incisions. 12. Anxiety disorder: Managed with diazepam bid prn. 13. Diastolic CHF/HTN: Monitor BP bid. Continue Isordil bid. On lipitor at bedtime. Hydralazine on hold.  Filed Weights   08/18/17 2230 08/19/17 0500 08/20/17 0538  Weight: 65.4 kg (144 lb 2.9 oz) 66.1 kg (145 lb 11.6 oz) 67.8 kg (149 lb 7.6 oz)    Appreciate Neprho recs 14. Acute on chronic anemia: On aranesp MWF with HD.    Hb 10.2 on 1/24   Cont to monitor 15. HTN   Cont meds   Labile with HD, recs per Nephro   Overall controlled on 1/25 16. Leukocytosis   WBCs 12.0 on 1/24   Labs with HD   Afebrile   Cont to monitor      LOS (Days) 4 A FACE TO FACE EVALUATION WAS PERFORMED  Damyan Corne Lorie Phenix 08/20/2017 9:24 AM

## 2017-08-20 NOTE — Progress Notes (Signed)
Physical Therapy Note  Patient Details  Name: Destiny Day MRN: [id number] Date of Birth: Oct 15, 1950 Today's Date: 08/20/2017    Time: 825-930 65 minutes  1:1 No c/o pain at first.  Pt performs bed mobility and transfers with supervision.  Gait 75', 25' with RW with supervision.  Stair negotiation 4 stairs with SPC and 1 handrail with min A.  Pt then supine on mat to perform therex.  Pt with 10/10 pain in supine, c/o "burning" in her shin and "pain" in her buttock.  RN made aware and meds given.  pt continues with 10/10 pain requiring min A for supine to sit and min A to stand. Pt able to perform gait 50', 25' despite pain.  Pt left in w/c in room with needs at hand, ice applied to back (per PA recommendation) to ease pain.   Nella Botsford 08/20/2017, 9:32 AM

## 2017-08-21 ENCOUNTER — Inpatient Hospital Stay (HOSPITAL_COMMUNITY): Payer: Medicare Other | Admitting: Occupational Therapy

## 2017-08-21 LAB — GLUCOSE, CAPILLARY
Glucose-Capillary: 90 mg/dL (ref 65–99)
Glucose-Capillary: 182 mg/dL — ABNORMAL HIGH (ref 65–99)
Glucose-Capillary: 164 mg/dL — ABNORMAL HIGH (ref 65–99)
Glucose-Capillary: 146 mg/dL — ABNORMAL HIGH (ref 65–99)

## 2017-08-21 MED ORDER — NA FERRIC GLUC CPLX IN SUCROSE 12.5 MG/ML IV SOLN
62.5000 mg | INTRAVENOUS | Status: DC
  Filled 2017-08-21: qty 5

## 2017-08-21 NOTE — Progress Notes (Signed)
[facility]  Subjective  - feels ok   Objective (!) 144/75 (!) 117 98.2 F (36.8 C) (Oral) 18 100%  Intake/Output Summary (Last 24 hours) at 08/21/2017 1056 Last data filed at 08/21/2017 0900 Gross per 24 hour  Intake 240 ml  Output 1000 ml  Net -760 ml   Right thigh some induration swelling no erythema  Assessment/Planning: Probably has small hematoma right mid thigh No infection or drainage She can leave dressing off this area  Ruta Hinds 08/21/2017 10:56 AM --  Laboratory Lab Results: Recent Labs    08/19/17 0710 08/20/17 1400  WBC 12.0* 11.1*  HGB 10.2* 9.0*  HCT 31.8* 28.8*  PLT 374 417*   BMET Recent Labs    08/19/17 0710 08/20/17 1400  NA 133* 128*  K 4.0 4.0  CL 92* 89*  CO2 29 25  GLUCOSE 137* 213*  BUN 12 26*  CREATININE 3.51* 5.88*  CALCIUM 9.4 9.4    COAG Lab Results  Component Value Date   INR 1.05 08/11/2017   INR 1.03 07/07/2016   INR 1.02 10/06/2015   No results found for: PTT

## 2017-08-21 NOTE — Progress Notes (Signed)
Destiny Day is a 67 y.o. female 05/29/1951 [id number]  Subjective: Complains of a lump in the right groin. No new problems. Slept well. Feeling OK.  Objective: Vital signs in last 24 hours: Temp:  [98.1 F (36.7 C)-98.5 F (36.9 C)] 98.2 F (36.8 C) (01/26 0516) Pulse Rate:  [81-132] 117 (01/26 0516) Resp:  [18] 18 (01/26 0516) BP: (111-163)/(43-86) 144/75 (01/26 0516) SpO2:  [91 %-100 %] 100 % (01/26 0911) FiO2 (%):  [21 %] 21 % (01/26 0911) Weight:  [148 lb 9.4 oz (67.4 kg)-153 lb 14.1 oz (69.8 kg)] 150 lb 9.2 oz (68.3 kg) (01/26 0516) Weight change: -14.1 oz (-0.4 kg) Last BM Date: 08/18/17  Intake/Output from previous day: 01/25 0701 - 01/26 0700 In: 360 [P.O.:360] Out: 1000  Last cbgs: CBG (last 3)  Recent Labs    08/20/17 2110 08/21/17 0655 08/21/17 1144  GLUCAP 243* 90 182*     Physical Exam General: No apparent distress   HEENT: not dry Lungs: Normal effort. Lungs clear to auscultation, no crackles or wheezes. Cardiovascular: Regular rate and rhythm, no edema Abdomen: S/NT/ND; BS(+) Musculoskeletal:  unchanged Neurological: No new neurological deficits Wounds: Clean Skin: clear  Aging changes.  There is an oval-shaped firm lump in the anterior proximal thigh on the left under the scab consistent with hematoma Mental state: Alert, oriented, cooperative    Lab Results: BMET    Component Value Date/Time   NA 128 (L) 08/20/2017 1400   NA 135 (A) 07/15/2016   K 4.0 08/20/2017 1400   CL 89 (L) 08/20/2017 1400   CO2 25 08/20/2017 1400   GLUCOSE 213 (H) 08/20/2017 1400   BUN 26 (H) 08/20/2017 1400   BUN 30 (A) 07/15/2016   CREATININE 5.88 (H) 08/20/2017 1400   CALCIUM 9.4 08/20/2017 1400   GFRNONAA 7 (L) 08/20/2017 1400   GFRAA 8 (L) 08/20/2017 1400   CBC    Component Value Date/Time   WBC 11.1 (H) 08/20/2017 1400   RBC 2.86 (L) 08/20/2017 1400   HGB 9.0 (L) 08/20/2017 1400   HCT 28.8 (L) 08/20/2017 1400   PLT 417 (H) 08/20/2017 1400   MCV 100.7 (H) 08/20/2017 1400   MCH 31.5 08/20/2017 1400   MCHC 31.3 08/20/2017 1400   RDW 16.8 (H) 08/20/2017 1400   LYMPHSABS 2.5 10/12/2015 1600   MONOABS 0.9 10/12/2015 1600   EOSABS 0.2 10/12/2015 1600   BASOSABS 0.0 10/12/2015 1600    Studies/Results: No results found.  Medications: I have reviewed the patient's current medications.  Assessment/Plan:  1.  Mobility deficit, transferred, ADL secondary to right lower extremity first digit amputation with a history of left TMA.  CIR 2.  DVT prophylaxis with heparin 3.  Pain management with as needed hydrocodone 4.  Type 2 diabetes.  Continue with Levemir daily 5.  End-stage renal disease continue with hemodialysis Monday Wednesday Friday 6.  Asthma.  Dulera inhaler twice daily 7.  PAD.  Status post right great toe amputation. 8.  Anxiety disorder.  Diazepam as needed 9.  Diastolic congestive heart failure.  Isordil twice daily hydralazine is on hold 10. Dyslipidemia on Lipitor 11.  Acute on chronic anemia will monitor CBC 12.  Right proximal thigh hematoma - will watch      Length of stay, days: 5  Walker Kehr , MD 08/21/2017, 1:25 PM

## 2017-08-21 NOTE — Progress Notes (Signed)
[facility] Progress Note   Dialysis Orders: MWF [facility]   4h   66.5kg  3K/ 2.25   Profile 4   LUA AVG   Hep 2500 , 1000 midrun Venofer 50mg  IV qwk - last 1/11 Mircera 67mcg IV q2wks - last 1/09  Assessment/Plan: 1. PVD s/p right fem po bypass with right great toe amp1/16 - for rehab 2. ESRD - MWF K 4 pre HD Friday - next HD Monday - orders written; stable will see again Monday- tight heparin 3. Anemia - hgb 9 Aranesp 150 scheduled for Monday - resume weekly Fe 4. Secondary hyperparathyroidism - P 2.2 Ca 9.4 - no binders - on VDRA 5. HTN/volume - net UF 1 L Friday with no post wt - need standing wts pre and post HD Monday 6. Nutrition - alb 2.8  7. DM  8. N/ V - holding phoslo/ velphoro 9. Disp - planning for Tuesday d/c - her husband will drive her to HD and her daughter will pick her up  Myriam Jacobson, PA-C [place] [phone] 08/21/2017,10:35 AM  LOS: 5 days   Pt seen, examined and agree w A/P as above.  Kelly Splinter MD [employer] pager [phone]   08/21/2017, 1:18 PM    Subjective:   A few service complaints; out of bed - using tennis ball on sore spots in back  Objective Vitals:   08/20/17 2037 08/20/17 2126 08/21/17 0516 08/21/17 0911  BP:  (!) 112/43 (!) 144/75   Pulse:   (!) 117   Resp:   18   Temp:   98.2 F (36.8 C)   TempSrc:   Oral   SpO2: 92%  100% 100%  Weight:   68.3 kg (150 lb 9.2 oz)   Height:       Physical Exam General: NAD animated sitting in chair Heart: RRR Lungs: no rales Abdomen: soft NT  Extremities: no LLE edema; right LE tr edema - foot in boot Dialysis Access: left upper AVGG + bruit  Additional Objective Labs: Basic Metabolic Panel: Recent Labs  Lab 08/16/17 0715 08/19/17 0710 08/20/17 1400  NA 131* 133* 128*  K 3.8 4.0 4.0  CL 92* 92* 89*  CO2 26 29 25   GLUCOSE 95 137* 213*  BUN 34* 12 26*  CREATININE 5.96* 3.51* 5.88*  CALCIUM 8.6* 9.4 9.4  PHOS 4.3 1.5*  2.2*   Liver Function Tests: Recent Labs  Lab 08/16/17 0715 08/19/17 0710 08/20/17 1400  ALBUMIN 2.6* 3.0* 2.8*   No results for input(s): LIPASE, AMYLASE in the last 168 hours. CBC: Recent Labs  Lab 08/14/17 1046 08/16/17 0715 08/19/17 0710 08/20/17 1400  WBC 14.9* 12.0* 12.0* 11.1*  HGB 9.1* 8.2* 10.2* 9.0*  HCT 27.8* 25.8* 31.8* 28.8*  MCV 97.9 97.7 100.6* 100.7*  PLT 296 273 374 417*   Blood Culture    Component Value Date/Time   SDES BLOOD LEFT HAND 01/10/2014 0020   SPECREQUEST BOTTLES DRAWN AEROBIC ONLY 5CC 01/10/2014 0020   CULT  01/10/2014 0020    NO GROWTH 5 DAYS Performed at [facility] 01/16/2014 FINAL 01/10/2014 0020    Cardiac Enzymes: No results for input(s): CKTOTAL, CKMB, CKMBINDEX, TROPONINI in the last 168 hours. CBG: Recent Labs  Lab 08/19/17 2101 08/20/17 0620 08/20/17 1858 08/20/17 2110 08/21/17 0655  GLUCAP 88 172* 85 243* 90   Iron Studies: No results for input(s): IRON, TIBC, TRANSFERRIN, FERRITIN in the last 72 hours. Lab Results  Component Value Date   INR 1.05 08/11/2017   INR 1.03 07/07/2016   INR 1.02 10/06/2015   Studies/Results: No results found. Medications:  . amLODipine  5 mg Oral QHS  . aspirin  81 mg Oral QPM  . atorvastatin  20 mg Oral QHS  . bisacodyl  5 mg Oral Q0600  . clopidogrel  75 mg Oral Daily  . [START ON 08/23/2017] darbepoetin (ARANESP) injection - DIALYSIS  150 mcg Intravenous Q Mon-HD  . doxercalciferol  6 mcg Intravenous Q M,W,F-HD  . gabapentin  100 mg Oral TID  . heparin  5,000 Units Subcutaneous Q8H  . insulin aspart  0-5 Units Subcutaneous QHS  . insulin aspart  0-9 Units Subcutaneous TID WC  . insulin detemir  10 Units Subcutaneous QHS  . isosorbide dinitrate  20 mg Oral BID  . mometasone-formoterol  2 puff Inhalation BID  . multivitamin  1 tablet Oral QHS  . pantoprazole  40 mg Oral Daily  . polyethylene glycol  17 g Oral BID  . senna-docusate  2 tablet Oral QHS              ]

## 2017-08-22 ENCOUNTER — Inpatient Hospital Stay (HOSPITAL_COMMUNITY): Payer: Medicare Other | Admitting: Occupational Therapy

## 2017-08-22 ENCOUNTER — Inpatient Hospital Stay (HOSPITAL_COMMUNITY): Payer: Medicare Other

## 2017-08-22 ENCOUNTER — Inpatient Hospital Stay (HOSPITAL_COMMUNITY): Payer: Medicare Other | Admitting: Physical Therapy

## 2017-08-22 LAB — GLUCOSE, CAPILLARY
Glucose-Capillary: 150 mg/dL — ABNORMAL HIGH (ref 65–99)
Glucose-Capillary: 131 mg/dL — ABNORMAL HIGH (ref 65–99)
Glucose-Capillary: 176 mg/dL — ABNORMAL HIGH (ref 65–99)
Glucose-Capillary: 172 mg/dL — ABNORMAL HIGH (ref 65–99)

## 2017-08-22 MED ORDER — SUCROFERRIC OXYHYDROXIDE 500 MG PO CHEW: 1000.0000 mg | CHEWABLE_TABLET | Freq: Three times a day (TID) | ORAL | Status: DC

## 2017-08-22 NOTE — Progress Notes (Signed)
Occupational Therapy Session Note  Patient Details  Name: Destiny Day MRN: [id number] Date of Birth: 02/09/1951  Today's Date: 08/22/2017 OT Individual Time: 1110-1209  And 1303-1350 OT Individual Time Calculation (min): 59 min and 47 min  Short Term Goals: Week 1:  OT Short Term Goal 1 (Week 1): STGs = LTGs  Skilled Therapeutic Interventions/Progress Updates:    Pt greeted in w/c, music playing at low volume. Reportedly already completed bathing/dressing w/c level in room prior to tx. Tx focus on dynamic balance, activity tolerance, and general strengthening during IADL engagement. Pt self propelled around bed to strip off linen, cues provided for w/c safety during dynamic activity. Afterwards, pt sidestepped around bed with bilateral UE support on mattress (and supervision). Min A for fitted sheet only as she did the rest herself. Pt donning pillowcases in standing and sitting positions (sitting during times of increased pain/fatigue). She was meticulous about each step, particularly spreading out sheets, blankets, comforter, and tucking all of them under mattress (standing without UE support). Per pt, her mother taught her how to make the bed and if it was not done correctly, her mother would make her start over. This familiar task seemed to comfort her and provide therapeutic distraction from Rt foot pain. At end of tx pt transferred to w/c and was set up for lunch with all needs within reach.   Prior to activity, Dycem reapplied to the sole of Rt Darco shoe due to pt verbalizing how helpful this was during PT this AM.     2nd Session 1:1 tx (47 min) Pt greeted in w/c, ready to go. Tx focus on IADL retraining, dynamic balance, walker safety, and functional ambulation during simple coffee/snack prep completion. She ambulated with RW and supervision to therapy apartment. Issued pt with walker bag, and went over safe walker positioning in kitchen for item retrieval. Pt demonstrating  carryover of education while making coffee and when retrieving apple from refrigerator, using walker bag for item transport. Pt completed cleanup and countertop washing with cues for hand placement on DME while side stepping. She retrieved 2 items from floor with min guard-close supervision. 1 small LOB while turning with RW, required Min A to correct. At end of tx pt propelled back to room for energy conservation. She was left with visitors and all needs within reach.    Therapy Documentation Precautions:  Precautions Precautions: Fall Other Brace/Splint: Darco shoe on Rt Restrictions Weight Bearing Restrictions: Yes RLE Weight Bearing: Weight bearing as tolerated Vital Signs: Therapy Vitals Pulse Rate: 83 Resp: 18 Patient Position (if appropriate): Sitting Oxygen Therapy O2 Device: Not Delivered FiO2 (%): 96 % Pain: Rt foot. Pt provided rest breaks during tx sessions to address. Also incorporated meaningful music listening for pain mgt during 1st session    ADL: ADL ADL Comments: refer to functional navigator     See Function Navigator for Current Functional Status.   Therapy/Group: Individual Therapy  Khylah Kendra A Lailyn Appelbaum 08/22/2017, 12:53 PM

## 2017-08-22 NOTE — Plan of Care (Signed)
  Progressing RH BOWEL ELIMINATION RH STG MANAGE BOWEL WITH ASSISTANCE Description STG Manage Bowel with [place].  08/22/2017 0345 - Progressing by Gwendolyn Grant, RN RH SKIN INTEGRITY RH STG SKIN FREE OF INFECTION/BREAKDOWN Description Pt will remain free of skin breakdown with min assist/cues.   08/22/2017 0345 - Progressing by Gwendolyn Grant, RN RH STG MAINTAIN SKIN INTEGRITY WITH ASSISTANCE Description STG Maintain Skin Integrity With min Assistance.  08/22/2017 0345 - Progressing by Gwendolyn Grant, RN RH STG ABLE TO PERFORM INCISION/WOUND CARE W/ASSISTANCE Description STG Able To Perform Incision/Wound Care With max Assistance.   08/22/2017 0345 - Progressing by Gwendolyn Grant, RN RH PAIN MANAGEMENT RH STG PAIN MANAGED AT OR BELOW PT'S PAIN GOAL Description < 3 out of 10.   08/22/2017 0345 - Progressing by Gwendolyn Grant, RN

## 2017-08-22 NOTE — Progress Notes (Signed)
Destiny Day is a 67 y.o. female [birth year] [id number]  Subjective: No new complaints. No new problems. Slept well. Feeling OK.  Objective: Vital signs in last 24 hours: Temp:  [98.2 F (36.8 C)-98.5 F (36.9 C)] 98.5 F (36.9 C) (01/27 1550) Pulse Rate:  [77-98] 77 (01/27 1550) Resp:  [18] 18 (01/27 1550) BP: (133-148)/(62-72) 148/72 (01/27 1550) SpO2:  [95 %-100 %] 95 % (01/27 1550) FiO2 (%):  [96 %] 96 % (01/27 0939) Weight:  [154 lb 5.2 oz (70 kg)] 154 lb 5.2 oz (70 kg) (01/27 0500) Weight change: 5 lb 11.7 oz (2.6 kg) Last BM Date: (P) 08/21/17  Intake/Output from previous day: 01/26 0701 - 01/27 0700 In: 582 [P.O.:582] Out: -  Last cbgs: CBG (last 3)  Recent Labs    08/22/17 0640 08/22/17 1205 08/22/17 1641  GLUCAP 150* 131* 176*     Physical Exam General: No apparent distress   HEENT: not dry Lungs: Normal effort. Lungs clear to auscultation, no crackles or wheezes. Cardiovascular: Regular rate and rhythm, no edema Abdomen: S/NT/ND; BS(+) Musculoskeletal:  unchanged Neurological: No new neurological deficits Wounds: Clean Skin: clear  Aging changes Mental state: Alert, oriented, cooperative    Lab Results: BMET    Component Value Date/Time   NA 128 (L) 08/20/2017 1400   NA 135 (A) 07/15/2016   K 4.0 08/20/2017 1400   CL 89 (L) 08/20/2017 1400   CO2 25 08/20/2017 1400   GLUCOSE 213 (H) 08/20/2017 1400   BUN 26 (H) 08/20/2017 1400   BUN 30 (A) 07/15/2016   CREATININE 5.88 (H) 08/20/2017 1400   CALCIUM 9.4 08/20/2017 1400   GFRNONAA 7 (L) 08/20/2017 1400   GFRAA 8 (L) 08/20/2017 1400   CBC    Component Value Date/Time   WBC 11.1 (H) 08/20/2017 1400   RBC 2.86 (L) 08/20/2017 1400   HGB 9.0 (L) 08/20/2017 1400   HCT 28.8 (L) 08/20/2017 1400   PLT 417 (H) 08/20/2017 1400   MCV 100.7 (H) 08/20/2017 1400   MCH 31.5 08/20/2017 1400   MCHC 31.3 08/20/2017 1400   RDW 16.8 (H) 08/20/2017 1400   LYMPHSABS 2.5 10/12/2015 1600   MONOABS 0.9  10/12/2015 1600   EOSABS 0.2 10/12/2015 1600   BASOSABS 0.0 10/12/2015 1600    Studies/Results: No results found.  Medications: I have reviewed the patient's current medications.  Assessment/Plan:  1.  Mobility deficit due to right lower extremity first digit amputation with a history of left TMA.  CIR. 2. DVT prophylaxis with heparin 3.  Pain management with hydrocodone as needed 4.  Type 2 diabetes-on Levemir 5.  End-stage renal disease.  Continue with hemodialysis on Monday Wednesday and Friday 6.  Asthma.  Dulera inhaler twice daily 7.  PAD.  Status post right great toe amputation 8.  Anxiety disorder.  Diazepam as needed 9.  Diastolic congestive heart failure.  Isordil twice daily, hydralazine is being held 10.  Dyslipidemia.  Continue with Lipitor 11.  Acute on chronic anemia.  We have been monitoring CBC 12.  Right proximal thigh hematoma.  Conservative management.    Length of stay, days: 6  Walker Kehr , MD 08/22/2017, 7:30 PM

## 2017-08-22 NOTE — Plan of Care (Signed)
  Progressing RH BOWEL ELIMINATION RH STG MANAGE BOWEL WITH ASSISTANCE Description STG Manage Bowel with [place].  08/22/2017 2250 - Progressing by Gwendolyn Grant, RN RH SKIN INTEGRITY RH STG SKIN FREE OF INFECTION/BREAKDOWN Description Pt will remain free of skin breakdown with min assist/cues.   08/22/2017 2250 - Progressing by Gwendolyn Grant, RN RH STG MAINTAIN SKIN INTEGRITY WITH ASSISTANCE Description STG Maintain Skin Integrity With min Assistance.  08/22/2017 2250 - Progressing by Gwendolyn Grant, RN Note Leg and foot incisions clean and dry; open to air RH PAIN MANAGEMENT RH STG PAIN MANAGED AT OR BELOW PT'S PAIN GOAL Description < 3 out of 10.   08/22/2017 2250 - Progressing by Gwendolyn Grant, RN

## 2017-08-22 NOTE — Progress Notes (Addendum)
[facility] Progress Note   Dialysis Orders: MWF [facility]   4h   66.5kg  3K/ 2.25   Profile 4   LUA AVG   Hep 2500 , 1000 midrun Venofer 50mg  IV qwk - last 1/11 Mircera 17mcg IV q2wks - last 1/09  Assessment: 1. PVD s/p right fem po bypass with right great toe amp1/16 - for rehab 2. ESRD - MWF K 4 pre HD Friday - next HD Monday - orders written 3. Anemia - hgb 9 Aranesp 150 scheduled for Monday - resume weekly Fe 4. Secondary hyperparathyroidism - P 2.2 Ca 9.4 - no binders - on VDRA 5. HTN/volume - net UF 1 L Friday with no post wt - need standing wts pre and post HD Monday 6. Nutrition - alb 2.8  7. DM  8. N/ V - resolved off of binders. Resume velphoro when needed (phos is low now), don't give phoslo and caused N/V.   9. Disp - planning for Tuesday d/c - her husband will drive her to HD and her daughter will pick her up   P - resume velphoro, no phoslo due to side effect     Kelly Splinter MD [facility] pager [phone]   08/22/2017, 1:23 PM    Subjective:   A few service complaints; out of bed - using tennis ball on sore spots in back  Objective Vitals:   08/21/17 2100 08/22/17 0500 08/22/17 0613 08/22/17 0939  BP:   133/62   Pulse:   98 83  Resp:   18 18  Temp:   98.2 F (36.8 C)   TempSrc:   Oral   SpO2: 95%  100%   Weight:  70 kg (154 lb 5.2 oz)    Height:       Physical Exam General: NAD animated sitting in chair Heart: RRR Lungs: no rales Abdomen: soft NT  Extremities: no LLE edema; right LE tr edema - foot in boot Dialysis Access: left upper AVGG + bruit  Additional Objective Labs: Basic Metabolic Panel: Recent Labs  Lab 08/16/17 0715 08/19/17 0710 08/20/17 1400  NA 131* 133* 128*  K 3.8 4.0 4.0  CL 92* 92* 89*  CO2 26 29 25   GLUCOSE 95 137* 213*  BUN 34* 12 26*  CREATININE 5.96* 3.51* 5.88*  CALCIUM 8.6* 9.4 9.4  PHOS 4.3 1.5* 2.2*   Liver Function Tests: Recent Labs  Lab 08/16/17 0715 08/19/17 0710  08/20/17 1400  ALBUMIN 2.6* 3.0* 2.8*   No results for input(s): LIPASE, AMYLASE in the last 168 hours. CBC: Recent Labs  Lab 08/16/17 0715 08/19/17 0710 08/20/17 1400  WBC 12.0* 12.0* 11.1*  HGB 8.2* 10.2* 9.0*  HCT 25.8* 31.8* 28.8*  MCV 97.7 100.6* 100.7*  PLT 273 374 417*   Blood Culture    Component Value Date/Time   SDES BLOOD LEFT HAND 01/10/2014 0020   SPECREQUEST BOTTLES DRAWN AEROBIC ONLY 5CC 01/10/2014 0020   CULT  01/10/2014 0020    NO GROWTH 5 DAYS Performed at [facility] 01/16/2014 FINAL 01/10/2014 0020    Cardiac Enzymes: No results for input(s): CKTOTAL, CKMB, CKMBINDEX, TROPONINI in the last 168 hours. CBG: Recent Labs  Lab 08/21/17 1144 08/21/17 1651 08/21/17 2159 08/22/17 0640 08/22/17 1205  GLUCAP 182* 164* 146* 150* 131*   Iron Studies: No results for input(s): IRON, TIBC, TRANSFERRIN, FERRITIN in the last 72 hours. Lab Results  Component Value Date   INR 1.05 08/11/2017   INR  1.03 07/07/2016   INR 1.02 10/06/2015   Studies/Results: No results found. Medications: . [START ON 08/23/2017] ferric gluconate (FERRLECIT/NULECIT) IV     . amLODipine  5 mg Oral QHS  . aspirin  81 mg Oral QPM  . atorvastatin  20 mg Oral QHS  . bisacodyl  5 mg Oral Q0600  . clopidogrel  75 mg Oral Daily  . [START ON 08/23/2017] darbepoetin (ARANESP) injection - DIALYSIS  150 mcg Intravenous Q Mon-HD  . doxercalciferol  6 mcg Intravenous Q M,W,F-HD  . gabapentin  100 mg Oral TID  . heparin  5,000 Units Subcutaneous Q8H  . insulin aspart  0-5 Units Subcutaneous QHS  . insulin aspart  0-9 Units Subcutaneous TID WC  . insulin detemir  10 Units Subcutaneous QHS  . isosorbide dinitrate  20 mg Oral BID  . mometasone-formoterol  2 puff Inhalation BID  . multivitamin  1 tablet Oral QHS  . pantoprazole  40 mg Oral Daily  . polyethylene glycol  17 g Oral BID  . senna-docusate  2 tablet Oral QHS             ]

## 2017-08-22 NOTE — Progress Notes (Signed)
Physical Therapy Session Note  Patient Details  Name: Destiny Day MRN: [id number] Date of Birth: [birth year]  Today's Date: 08/22/2017 PT Individual Time: 8921-1941 PT Individual Time Calculation (min): 43 min   Short Term Goals: Week 1:  PT Short Term Goal 1 (Week 1): =LTG  Skilled Therapeutic Interventions/Progress Updates:    Pt seated in w/c upon PT arrival, agreeable to therapy tx and reports pain 2/10 in R upper thigh. Pt transferred from sit<>stand with supervision and ambulated from room>gym x 100 ft with supervision and RW, increased time to complete. Pt worked on dynamic standing balance in order to participate in ball toss, horse shoes and perform toe taps. Pt performed x 10 sit<>stands for LE strengthening, verbal cues for techniques. Pt ambulated x 80 ft and x 150 ft with RW and supervision working on activity tolerance and endurance. Pt left seated in w/c at end of session with needs in reach and husband present.   Therapy Documentation Precautions:  Precautions Precautions: Fall Other Brace/Splint: Darco shoe on Rt Restrictions Weight Bearing Restrictions: Yes RLE Weight Bearing: Weight bearing as tolerated   See Function Navigator for Current Functional Status.   Therapy/Group: Individual Therapy  Netta Corrigan , PT, DPT 08/22/2017, 1:12 PM

## 2017-08-22 NOTE — Progress Notes (Signed)
Occupational Therapy Session Note  Patient Details  Name: Destiny Day MRN: [id number] Date of Birth: 01-28-1951  Today's Date: 08/21/2017 OT Individual Time: 8:45-938 Total minutes = 53 minutes  Skilled Therapeutic Interventions/Progress Updates:   Patient in bed upon approach but agreeable to work on activity tolerance and transfers.   Her partiicpation as follows:     Bed mobility=close S Dynamic balance for core strengthening to increase transfers and self care= close S for forward, lower leans for stability and control.   Donning shoes, including right boot shoe= extra time and setup         Bed to w/c transfers = supervision  Patient education on bed mobility and position as she stated that in her bed, sitting and lying on her back/buttocks was causing her bottom to 'feel uncomfortable.'    She concurred to sitting in recliner with legs elevated at times and complete pressure relief with modified sidelying/sitting  Patient was left seated at the sink grooming and with call bell within reach when.....  While patient sat in w/c at sink grooming, her husband arrived and she stated she preferred that he would continue to help her with grooming and bathing, etc   Husband concurred to help with bathing (as he had been doing prior at home)  They asked to be left alone to complete the bathing/bathing and informed to call for help if needed.   Therapy Documentation Precautions:  Precautions Precautions: Fall Other Brace/Splint: Darco shoe on Rt Restrictions Weight Bearing Restrictions: Yes RLE Weight Bearing: Weight bearing as tolerated  Pain:denied   Therapy/Group: Individual Therapy  Alfredia Ferguson [facility] 08/22/2017, 1:19 PM

## 2017-08-22 NOTE — Progress Notes (Signed)
Physical Therapy Note  Patient Details  Name: SANARI OFFNER MRN: [id number] Date of Birth: 07/01/1951 Today's Date: 08/22/2017    Time: 845-940 55 minutes  1:1 Pt with no c/o pain during session, states a little "throbbing" in amputation site after gait that eases with rest.  gait  50' x 2 with RW with distant supervision.  Stair negotiation with 1 railing and SPC with min A.  Pt states she feels the bottom of her darco shoe is too "slippery".  PT applied dysum to the bottom which pt states "helps a lot".  seated therex 2 x 10 LAQ, hip flex, hip abd/add, 5x sit to stand.  nustep per pt request x 9 minutes level 4.  Pt left in room with needs at hand.   [name] 08/22/2017, 10:28 AM

## 2017-08-23 ENCOUNTER — Inpatient Hospital Stay (HOSPITAL_COMMUNITY): Payer: Medicare Other

## 2017-08-23 ENCOUNTER — Encounter (HOSPITAL_COMMUNITY)
Admission: AD | Disposition: A | Discharge status: Rehabilitation Facility | Payer: Self-pay | Source: Intra-hospital | Attending: Neurosurgery

## 2017-08-23 ENCOUNTER — Inpatient Hospital Stay (HOSPITAL_COMMUNITY): Payer: Medicare Other | Admitting: Occupational Therapy

## 2017-08-23 ENCOUNTER — Inpatient Hospital Stay (HOSPITAL_COMMUNITY): Payer: Medicare Other | Admitting: Physical Therapy

## 2017-08-23 ENCOUNTER — Inpatient Hospital Stay (HOSPITAL_COMMUNITY): Payer: Medicare Other | Admitting: Certified Registered Nurse Anesthetist

## 2017-08-23 ENCOUNTER — Inpatient Hospital Stay (HOSPITAL_COMMUNITY)
Admission: AD | Admit: 2017-08-23 | Discharge: 2017-08-30 | DRG: 025 | Disposition: A | Discharge status: Rehabilitation Facility | Payer: Medicare Other | Source: Intra-hospital | Attending: Neurosurgery | Admitting: Neurosurgery

## 2017-08-23 DIAGNOSIS — Z7982 Long term (current) use of aspirin: Secondary | ICD-10-CM

## 2017-08-23 DIAGNOSIS — J9600 Acute respiratory failure, unspecified whether with hypoxia or hypercapnia: Secondary | ICD-10-CM

## 2017-08-23 DIAGNOSIS — E1122 Type 2 diabetes mellitus with diabetic chronic kidney disease: Secondary | ICD-10-CM | POA: Diagnosis present

## 2017-08-23 DIAGNOSIS — Z79890 Hormone replacement therapy (postmenopausal): Secondary | ICD-10-CM

## 2017-08-23 DIAGNOSIS — Z794 Long term (current) use of insulin: Secondary | ICD-10-CM

## 2017-08-23 DIAGNOSIS — E1151 Type 2 diabetes mellitus with diabetic peripheral angiopathy without gangrene: Secondary | ICD-10-CM | POA: Diagnosis present

## 2017-08-23 DIAGNOSIS — J9690 Respiratory failure, unspecified, unspecified whether with hypoxia or hypercapnia: Secondary | ICD-10-CM | POA: Diagnosis not present

## 2017-08-23 DIAGNOSIS — D631 Anemia in chronic kidney disease: Secondary | ICD-10-CM | POA: Diagnosis present

## 2017-08-23 DIAGNOSIS — Z89411 Acquired absence of right great toe: Secondary | ICD-10-CM

## 2017-08-23 DIAGNOSIS — G4733 Obstructive sleep apnea (adult) (pediatric): Secondary | ICD-10-CM | POA: Diagnosis present

## 2017-08-23 DIAGNOSIS — Z833 Family history of diabetes mellitus: Secondary | ICD-10-CM

## 2017-08-23 DIAGNOSIS — S065X9D Traumatic subdural hemorrhage with loss of consciousness of unspecified duration, subsequent encounter: Secondary | ICD-10-CM | POA: Diagnosis not present

## 2017-08-23 DIAGNOSIS — I10 Essential (primary) hypertension: Secondary | ICD-10-CM

## 2017-08-23 DIAGNOSIS — D638 Anemia in other chronic diseases classified elsewhere: Secondary | ICD-10-CM | POA: Diagnosis not present

## 2017-08-23 DIAGNOSIS — G9340 Encephalopathy, unspecified: Secondary | ICD-10-CM | POA: Diagnosis not present

## 2017-08-23 DIAGNOSIS — Z90710 Acquired absence of both cervix and uterus: Secondary | ICD-10-CM

## 2017-08-23 DIAGNOSIS — Z9911 Dependence on respirator [ventilator] status: Secondary | ICD-10-CM | POA: Diagnosis not present

## 2017-08-23 DIAGNOSIS — I739 Peripheral vascular disease, unspecified: Secondary | ICD-10-CM | POA: Diagnosis not present

## 2017-08-23 DIAGNOSIS — M1990 Unspecified osteoarthritis, unspecified site: Secondary | ICD-10-CM | POA: Diagnosis present

## 2017-08-23 DIAGNOSIS — Y92238 Other place in hospital as the place of occurrence of the external cause: Secondary | ICD-10-CM | POA: Diagnosis present

## 2017-08-23 DIAGNOSIS — Z4682 Encounter for fitting and adjustment of non-vascular catheter: Secondary | ICD-10-CM | POA: Diagnosis not present

## 2017-08-23 DIAGNOSIS — M7989 Other specified soft tissue disorders: Secondary | ICD-10-CM | POA: Diagnosis present

## 2017-08-23 DIAGNOSIS — F411 Generalized anxiety disorder: Secondary | ICD-10-CM | POA: Diagnosis not present

## 2017-08-23 DIAGNOSIS — N2581 Secondary hyperparathyroidism of renal origin: Secondary | ICD-10-CM | POA: Diagnosis present

## 2017-08-23 DIAGNOSIS — J90 Pleural effusion, not elsewhere classified: Secondary | ICD-10-CM | POA: Diagnosis not present

## 2017-08-23 DIAGNOSIS — R531 Weakness: Secondary | ICD-10-CM | POA: Diagnosis not present

## 2017-08-23 DIAGNOSIS — Z298 Encounter for other specified prophylactic measures: Secondary | ICD-10-CM

## 2017-08-23 DIAGNOSIS — J309 Allergic rhinitis, unspecified: Secondary | ICD-10-CM | POA: Diagnosis not present

## 2017-08-23 DIAGNOSIS — Z4659 Encounter for fitting and adjustment of other gastrointestinal appliance and device: Secondary | ICD-10-CM

## 2017-08-23 DIAGNOSIS — J449 Chronic obstructive pulmonary disease, unspecified: Secondary | ICD-10-CM | POA: Diagnosis present

## 2017-08-23 DIAGNOSIS — E871 Hypo-osmolality and hyponatremia: Secondary | ICD-10-CM | POA: Diagnosis not present

## 2017-08-23 DIAGNOSIS — W19XXXA Unspecified fall, initial encounter: Secondary | ICD-10-CM | POA: Diagnosis not present

## 2017-08-23 DIAGNOSIS — S065X9A Traumatic subdural hemorrhage with loss of consciousness of unspecified duration, initial encounter: Secondary | ICD-10-CM

## 2017-08-23 DIAGNOSIS — R0989 Other specified symptoms and signs involving the circulatory and respiratory systems: Secondary | ICD-10-CM | POA: Diagnosis not present

## 2017-08-23 DIAGNOSIS — Z7902 Long term (current) use of antithrombotics/antiplatelets: Secondary | ICD-10-CM

## 2017-08-23 DIAGNOSIS — E1142 Type 2 diabetes mellitus with diabetic polyneuropathy: Secondary | ICD-10-CM

## 2017-08-23 DIAGNOSIS — Z452 Encounter for adjustment and management of vascular access device: Secondary | ICD-10-CM

## 2017-08-23 DIAGNOSIS — E785 Hyperlipidemia, unspecified: Secondary | ICD-10-CM | POA: Diagnosis present

## 2017-08-23 DIAGNOSIS — R5381 Other malaise: Secondary | ICD-10-CM | POA: Diagnosis not present

## 2017-08-23 DIAGNOSIS — K219 Gastro-esophageal reflux disease without esophagitis: Secondary | ICD-10-CM | POA: Diagnosis present

## 2017-08-23 DIAGNOSIS — S065XAA Traumatic subdural hemorrhage with loss of consciousness status unknown, initial encounter: Secondary | ICD-10-CM

## 2017-08-23 DIAGNOSIS — R51 Headache: Secondary | ICD-10-CM | POA: Diagnosis not present

## 2017-08-23 DIAGNOSIS — I120 Hypertensive chronic kidney disease with stage 5 chronic kidney disease or end stage renal disease: Secondary | ICD-10-CM | POA: Diagnosis present

## 2017-08-23 DIAGNOSIS — M810 Age-related osteoporosis without current pathological fracture: Secondary | ICD-10-CM | POA: Diagnosis present

## 2017-08-23 DIAGNOSIS — S065X0A Traumatic subdural hemorrhage without loss of consciousness, initial encounter: Secondary | ICD-10-CM | POA: Diagnosis not present

## 2017-08-23 DIAGNOSIS — D72829 Elevated white blood cell count, unspecified: Secondary | ICD-10-CM | POA: Diagnosis not present

## 2017-08-23 DIAGNOSIS — Z992 Dependence on renal dialysis: Secondary | ICD-10-CM | POA: Diagnosis not present

## 2017-08-23 DIAGNOSIS — Z1159 Encounter for screening for other viral diseases: Secondary | ICD-10-CM

## 2017-08-23 DIAGNOSIS — J9601 Acute respiratory failure with hypoxia: Secondary | ICD-10-CM | POA: Diagnosis present

## 2017-08-23 DIAGNOSIS — Z888 Allergy status to other drugs, medicaments and biological substances status: Secondary | ICD-10-CM

## 2017-08-23 DIAGNOSIS — J9811 Atelectasis: Secondary | ICD-10-CM | POA: Diagnosis present

## 2017-08-23 DIAGNOSIS — E119 Type 2 diabetes mellitus without complications: Secondary | ICD-10-CM | POA: Diagnosis not present

## 2017-08-23 DIAGNOSIS — N186 End stage renal disease: Secondary | ICD-10-CM | POA: Diagnosis present

## 2017-08-23 DIAGNOSIS — I6201 Nontraumatic acute subdural hemorrhage: Secondary | ICD-10-CM | POA: Diagnosis not present

## 2017-08-23 DIAGNOSIS — J45909 Unspecified asthma, uncomplicated: Secondary | ICD-10-CM | POA: Diagnosis not present

## 2017-08-23 DIAGNOSIS — Z87891 Personal history of nicotine dependence: Secondary | ICD-10-CM

## 2017-08-23 DIAGNOSIS — R069 Unspecified abnormalities of breathing: Secondary | ICD-10-CM

## 2017-08-23 DIAGNOSIS — Z79899 Other long term (current) drug therapy: Secondary | ICD-10-CM

## 2017-08-23 DIAGNOSIS — D62 Acute posthemorrhagic anemia: Secondary | ICD-10-CM | POA: Diagnosis not present

## 2017-08-23 DIAGNOSIS — I159 Secondary hypertension, unspecified: Secondary | ICD-10-CM | POA: Diagnosis not present

## 2017-08-23 DIAGNOSIS — S065X0D Traumatic subdural hemorrhage without loss of consciousness, subsequent encounter: Secondary | ICD-10-CM | POA: Diagnosis not present

## 2017-08-23 HISTORY — PX: CRANIOTOMY: SHX93

## 2017-08-23 LAB — POCT I-STAT 3, ART BLOOD GAS (G3+)
Acid-Base Excess: 6 mmol/L — ABNORMAL HIGH (ref 0.0–2.0)
Bicarbonate: 27.6 mmol/L (ref 20.0–28.0)
O2 Saturation: 100 %
Patient temperature: 98.6
TCO2: 29 mmol/L (ref 22–32)
pCO2 arterial: 29.3 mmHg — ABNORMAL LOW (ref 32.0–48.0)
pH, Arterial: 7.582 — ABNORMAL HIGH (ref 7.350–7.450)
pO2, Arterial: 410 mmHg — ABNORMAL HIGH (ref 83.0–108.0)

## 2017-08-23 LAB — POCT I-STAT 7, (LYTES, BLD GAS, ICA,H+H)
Acid-Base Excess: 6 mmol/L — ABNORMAL HIGH (ref 0.0–2.0)
Bicarbonate: 30.4 mmol/L — ABNORMAL HIGH (ref 20.0–28.0)
Calcium, Ion: 1.13 mmol/L — ABNORMAL LOW (ref 1.15–1.40)
HCT: 26 % — ABNORMAL LOW (ref 36.0–46.0)
Hemoglobin: 8.8 g/dL — ABNORMAL LOW (ref 12.0–15.0)
O2 Saturation: 100 %
Patient temperature: 35.5
Potassium: 3.9 mmol/L (ref 3.5–5.1)
Sodium: 125 mmol/L — ABNORMAL LOW (ref 135–145)
TCO2: 32 mmol/L (ref 22–32)
pCO2 arterial: 40.1 mmHg (ref 32.0–48.0)
pH, Arterial: 7.482 — ABNORMAL HIGH (ref 7.350–7.450)
pO2, Arterial: 451 mmHg — ABNORMAL HIGH (ref 83.0–108.0)

## 2017-08-23 LAB — CBC WITH DIFFERENTIAL/PLATELET
Basophils Absolute: 0 10*3/uL (ref 0.0–0.1)
Basophils Relative: 0 %
Eosinophils Absolute: 0.2 10*3/uL (ref 0.0–0.7)
Eosinophils Relative: 1 %
HCT: 21.6 % — ABNORMAL LOW (ref 36.0–46.0)
Hemoglobin: 7.1 g/dL — ABNORMAL LOW (ref 12.0–15.0)
Lymphocytes Relative: 14 %
Lymphs Abs: 1.9 10*3/uL (ref 0.7–4.0)
MCH: 32.6 pg (ref 26.0–34.0)
MCHC: 32.9 g/dL (ref 30.0–36.0)
MCV: 99.1 fL (ref 78.0–100.0)
Monocytes Absolute: 0.5 10*3/uL (ref 0.1–1.0)
Monocytes Relative: 4 %
Neutro Abs: 11.2 10*3/uL — ABNORMAL HIGH (ref 1.7–7.7)
Neutrophils Relative %: 81 %
Platelets: 449 10*3/uL — ABNORMAL HIGH (ref 150–400)
RBC: 2.18 MIL/uL — ABNORMAL LOW (ref 3.87–5.11)
RDW: 16.9 % — ABNORMAL HIGH (ref 11.5–15.5)
WBC: 13.8 10*3/uL — ABNORMAL HIGH (ref 4.0–10.5)

## 2017-08-23 LAB — RENAL FUNCTION PANEL
Albumin: 2.4 g/dL — ABNORMAL LOW (ref 3.5–5.0)
Anion gap: 15 (ref 5–15)
BUN: 33 mg/dL — ABNORMAL HIGH (ref 6–20)
CO2: 22 mmol/L (ref 22–32)
Calcium: 8.5 mg/dL — ABNORMAL LOW (ref 8.9–10.3)
Chloride: 90 mmol/L — ABNORMAL LOW (ref 101–111)
Creatinine, Ser: 6.49 mg/dL — ABNORMAL HIGH (ref 0.44–1.00)
GFR calc Af Amer: 7 mL/min — ABNORMAL LOW (ref >60)
GFR calc non Af Amer: 6 mL/min — ABNORMAL LOW (ref >60)
Glucose, Bld: 176 mg/dL — ABNORMAL HIGH (ref 65–99)
Phosphorus: 3.1 mg/dL (ref 2.5–4.6)
Potassium: 3.9 mmol/L (ref 3.5–5.1)
Sodium: 127 mmol/L — ABNORMAL LOW (ref 135–145)

## 2017-08-23 LAB — TRIGLYCERIDES: Triglycerides: 123 mg/dL (ref <150)

## 2017-08-23 LAB — GLUCOSE, CAPILLARY
Glucose-Capillary: 106 mg/dL — ABNORMAL HIGH (ref 65–99)
Glucose-Capillary: 121 mg/dL — ABNORMAL HIGH (ref 65–99)
Glucose-Capillary: 130 mg/dL — ABNORMAL HIGH (ref 65–99)
Glucose-Capillary: 134 mg/dL — ABNORMAL HIGH (ref 65–99)
Glucose-Capillary: 134 mg/dL — ABNORMAL HIGH (ref 65–99)

## 2017-08-23 LAB — PROTIME-INR
INR: 1.02
Prothrombin Time: 13.3 seconds (ref 11.4–15.2)

## 2017-08-23 SURGERY — CRANIOTOMY HEMATOMA EVACUATION SUBDURAL
Anesthesia: General | Site: Head

## 2017-08-23 MED ORDER — FERRIC CITRATE 1 GM 210 MG(FE) PO TABS
420.0000 mg | ORAL_TABLET | Freq: Three times a day (TID) | ORAL | Status: DC
  Administered 2017-08-23 – 2017-08-29 (×6): 420 mg via ORAL
  Filled 2017-08-23 (×23): qty 2

## 2017-08-23 MED ORDER — THROMBIN (RECOMBINANT) 5000 UNITS EX SOLR
CUTANEOUS | Status: AC
  Filled 2017-08-23: qty 15000

## 2017-08-23 MED ORDER — ONDANSETRON HCL 4 MG/2ML IJ SOLN
INTRAMUSCULAR | Status: AC
  Filled 2017-08-23: qty 2

## 2017-08-23 MED ORDER — CEFAZOLIN SODIUM-DEXTROSE 2-3 GM-%(50ML) IV SOLR
INTRAVENOUS | Status: DC | PRN
  Administered 2017-08-23: 2 g via INTRAVENOUS

## 2017-08-23 MED ORDER — LIDOCAINE-EPINEPHRINE 0.5 %-1:200000 IJ SOLN
INTRAMUSCULAR | Status: AC
  Filled 2017-08-23: qty 1

## 2017-08-23 MED ORDER — LABETALOL HCL 5 MG/ML IV SOLN
10.0000 mg | INTRAVENOUS | Status: DC | PRN
  Administered 2017-08-26 – 2017-08-27 (×2): 20 mg via INTRAVENOUS
  Administered 2017-08-27: 10 mg via INTRAVENOUS
  Administered 2017-08-28: 20 mg via INTRAVENOUS
  Administered 2017-08-28: 40 mg via INTRAVENOUS
  Filled 2017-08-23 (×2): qty 4
  Filled 2017-08-23: qty 8
  Filled 2017-08-23 (×2): qty 4

## 2017-08-23 MED ORDER — ETOMIDATE 2 MG/ML IV SOLN
20.0000 mg | Freq: Once | INTRAVENOUS | Status: AC
  Administered 2017-08-23: 20 mg via INTRAVENOUS

## 2017-08-23 MED ORDER — MIDAZOLAM HCL 2 MG/2ML IJ SOLN
2.0000 mg | Freq: Once | INTRAMUSCULAR | Status: AC
  Administered 2017-08-23: 2 mg via INTRAVENOUS

## 2017-08-23 MED ORDER — BACITRACIN ZINC 500 UNIT/GM EX OINT
TOPICAL_OINTMENT | CUTANEOUS | Status: AC
Start: 2017-08-23 — End: ?
  Filled 2017-08-23: qty 28.35

## 2017-08-23 MED ORDER — FENTANYL CITRATE (PF) 100 MCG/2ML IJ SOLN: 50.0000 ug | INTRAMUSCULAR | Status: DC | PRN

## 2017-08-23 MED ORDER — POTASSIUM CHLORIDE IN NACL 20-0.9 MEQ/L-% IV SOLN
INTRAVENOUS | Status: DC
  Administered 2017-08-23 – 2017-08-24 (×2): via INTRAVENOUS
  Filled 2017-08-23 (×2): qty 1000

## 2017-08-23 MED ORDER — PROMETHAZINE HCL 25 MG PO TABS: 12.5000 mg | ORAL_TABLET | ORAL | Status: DC | PRN

## 2017-08-23 MED ORDER — FLEET ENEMA 7-19 GM/118ML RE ENEM: 1.0000 | ENEMA | Freq: Once | RECTAL | Status: DC | PRN

## 2017-08-23 MED ORDER — PROPOFOL 10 MG/ML IV BOLUS
INTRAVENOUS | Status: AC
  Filled 2017-08-23: qty 20

## 2017-08-23 MED ORDER — NICARDIPINE HCL IN NACL 20-0.86 MG/200ML-% IV SOLN
3.0000 mg/h | INTRAVENOUS | Status: DC
  Administered 2017-08-23: 15 mg/h via INTRAVENOUS
  Administered 2017-08-23: 10 mg/h via INTRAVENOUS
  Filled 2017-08-23: qty 200

## 2017-08-23 MED ORDER — MIDAZOLAM HCL 2 MG/2ML IJ SOLN
INTRAMUSCULAR | Status: AC
  Filled 2017-08-23: qty 2

## 2017-08-23 MED ORDER — SODIUM CHLORIDE 0.9 % IV SOLN
Freq: Once | INTRAVENOUS | Status: AC
  Administered 2017-08-28: 19:00:00 via INTRAVENOUS

## 2017-08-23 MED ORDER — ALBUTEROL SULFATE (2.5 MG/3ML) 0.083% IN NEBU: 2.5000 mg | INHALATION_SOLUTION | RESPIRATORY_TRACT | Status: DC | PRN

## 2017-08-23 MED ORDER — NICARDIPINE HCL IN NACL 20-0.86 MG/200ML-% IV SOLN
INTRAVENOUS | Status: AC
  Filled 2017-08-23: qty 200

## 2017-08-23 MED ORDER — HYDRALAZINE HCL 25 MG PO TABS
25.0000 mg | ORAL_TABLET | ORAL | Status: DC
  Administered 2017-08-24 – 2017-08-29 (×6): 25 mg via ORAL
  Filled 2017-08-23 (×6): qty 1

## 2017-08-23 MED ORDER — CHLORHEXIDINE GLUCONATE 0.12% ORAL RINSE (MEDLINE KIT)
15.0000 mL | Freq: Two times a day (BID) | OROMUCOSAL | Status: DC
  Administered 2017-08-23 – 2017-08-28 (×9): 15 mL via OROMUCOSAL

## 2017-08-23 MED ORDER — DEXAMETHASONE SODIUM PHOSPHATE 10 MG/ML IJ SOLN
INTRAMUSCULAR | Status: AC
  Filled 2017-08-23: qty 1

## 2017-08-23 MED ORDER — BACITRACIN ZINC 500 UNIT/GM EX OINT
TOPICAL_OINTMENT | CUTANEOUS | Status: DC | PRN
  Administered 2017-08-23: 1 via TOPICAL

## 2017-08-23 MED ORDER — CHLORHEXIDINE GLUCONATE 0.12% ORAL RINSE (MEDLINE KIT): 15.0000 mL | Freq: Two times a day (BID) | OROMUCOSAL | Status: DC

## 2017-08-23 MED ORDER — NALOXONE HCL 0.4 MG/ML IJ SOLN: 0.0800 mg | INTRAMUSCULAR | Status: DC | PRN

## 2017-08-23 MED ORDER — FENTANYL BOLUS VIA INFUSION
25.0000 ug | INTRAVENOUS | Status: DC | PRN
  Filled 2017-08-23: qty 25

## 2017-08-23 MED ORDER — MANNITOL 25 % IV SOLN
INTRAVENOUS | Status: AC
  Filled 2017-08-23: qty 50

## 2017-08-23 MED ORDER — SODIUM CHLORIDE 0.9 % IV SOLN
500.0000 mg | Freq: Two times a day (BID) | INTRAVENOUS | Status: DC
  Administered 2017-08-23 – 2017-08-30 (×13): 500 mg via INTRAVENOUS
  Filled 2017-08-23 (×15): qty 5

## 2017-08-23 MED ORDER — FENTANYL CITRATE (PF) 100 MCG/2ML IJ SOLN
INTRAMUSCULAR | Status: DC | PRN
  Administered 2017-08-23 (×2): 50 ug via INTRAVENOUS

## 2017-08-23 MED ORDER — CHLORHEXIDINE GLUCONATE CLOTH 2 % EX PADS
6.0000 | MEDICATED_PAD | Freq: Every day | CUTANEOUS | Status: DC
  Administered 2017-08-23: 6 via TOPICAL

## 2017-08-23 MED ORDER — FENTANYL CITRATE (PF) 100 MCG/2ML IJ SOLN
INTRAMUSCULAR | Status: AC
  Administered 2017-08-23: 50 ug
  Filled 2017-08-23: qty 2

## 2017-08-23 MED ORDER — FENTANYL CITRATE (PF) 250 MCG/5ML IJ SOLN
INTRAMUSCULAR | Status: AC
  Filled 2017-08-23: qty 5

## 2017-08-23 MED ORDER — SODIUM CHLORIDE 0.9% FLUSH
10.0000 mL | Freq: Two times a day (BID) | INTRAVENOUS | Status: DC
  Administered 2017-08-23: 20 mL
  Administered 2017-08-24: 10 mL

## 2017-08-23 MED ORDER — SODIUM CHLORIDE 0.9% FLUSH
10.0000 mL | INTRAVENOUS | Status: DC | PRN
  Administered 2017-08-25: 20 mL
  Administered 2017-08-28: 10 mL
  Filled 2017-08-23 (×2): qty 40

## 2017-08-23 MED ORDER — MIDAZOLAM HCL 5 MG/5ML IJ SOLN
INTRAMUSCULAR | Status: DC | PRN
  Administered 2017-08-23: 2 mg via INTRAVENOUS

## 2017-08-23 MED ORDER — MOMETASONE FURO-FORMOTEROL FUM 200-5 MCG/ACT IN AERO
2.0000 | INHALATION_SPRAY | Freq: Two times a day (BID) | RESPIRATORY_TRACT | Status: DC
  Administered 2017-08-25 – 2017-08-29 (×10): 2 via RESPIRATORY_TRACT
  Filled 2017-08-23 (×3): qty 8.8

## 2017-08-23 MED ORDER — HYDRALAZINE HCL 20 MG/ML IJ SOLN: 10.0000 mg | Freq: Once | INTRAMUSCULAR | Status: DC

## 2017-08-23 MED ORDER — THROMBIN (RECOMBINANT) 20000 UNITS EX SOLR
CUTANEOUS | Status: AC
  Filled 2017-08-23: qty 20000

## 2017-08-23 MED ORDER — PHENYLEPHRINE 40 MCG/ML (10ML) SYRINGE FOR IV PUSH (FOR BLOOD PRESSURE SUPPORT)
PREFILLED_SYRINGE | INTRAVENOUS | Status: DC | PRN
  Administered 2017-08-23 (×2): 120 ug via INTRAVENOUS

## 2017-08-23 MED ORDER — HYDROCODONE-ACETAMINOPHEN 5-325 MG PO TABS
1.0000 | ORAL_TABLET | ORAL | Status: DC | PRN
Start: 2017-08-23 — End: 2017-08-30
  Administered 2017-08-28 – 2017-08-30 (×3): 1 via ORAL
  Filled 2017-08-23 (×2): qty 1

## 2017-08-23 MED ORDER — SODIUM CHLORIDE 0.9 % IV SOLN
Freq: Once | INTRAVENOUS | Status: AC
  Administered 2017-08-23: 11:00:00 via INTRAVENOUS

## 2017-08-23 MED ORDER — MIDAZOLAM HCL 2 MG/2ML IJ SOLN: 1.0000 mg | INTRAMUSCULAR | Status: DC | PRN

## 2017-08-23 MED ORDER — ORAL CARE MOUTH RINSE
15.0000 mL | OROMUCOSAL | Status: DC
  Administered 2017-08-23 – 2017-08-26 (×29): 15 mL via OROMUCOSAL

## 2017-08-23 MED ORDER — HYDRALAZINE HCL 25 MG PO TABS: 25.0000 mg | ORAL_TABLET | ORAL | Status: DC

## 2017-08-23 MED ORDER — ORAL CARE MOUTH RINSE: 15.0000 mL | Freq: Four times a day (QID) | OROMUCOSAL | Status: DC

## 2017-08-23 MED ORDER — HEMOSTATIC AGENTS (NO CHARGE) OPTIME
TOPICAL | Status: DC | PRN
  Administered 2017-08-23: 1 via TOPICAL

## 2017-08-23 MED ORDER — MANNITOL 25 % IV SOLN
37.5000 g | INTRAVENOUS | Status: AC
  Administered 2017-08-23: 37.5 g via INTRAVENOUS

## 2017-08-23 MED ORDER — FENTANYL CITRATE (PF) 100 MCG/2ML IJ SOLN
100.0000 ug | Freq: Once | INTRAMUSCULAR | Status: AC
  Administered 2017-08-23: 100 ug via INTRAVENOUS

## 2017-08-23 MED ORDER — NICARDIPINE HCL IN NACL 40-0.83 MG/200ML-% IV SOLN
3.0000 mg/h | INTRAVENOUS | Status: DC
  Administered 2017-08-23 – 2017-08-24 (×4): 15 mg/h via INTRAVENOUS
  Administered 2017-08-24 (×2): 10 mg/h via INTRAVENOUS
  Filled 2017-08-23 (×8): qty 200

## 2017-08-23 MED ORDER — 0.9 % SODIUM CHLORIDE (POUR BTL) OPTIME
TOPICAL | Status: DC | PRN
  Administered 2017-08-23 (×2): 1000 mL

## 2017-08-23 MED ORDER — HYDRALAZINE HCL 25 MG PO TABS
25.0000 mg | ORAL_TABLET | ORAL | Status: DC
  Administered 2017-08-23 (×2): 25 mg via ORAL
  Filled 2017-08-23 (×2): qty 1

## 2017-08-23 MED ORDER — PANTOPRAZOLE SODIUM 40 MG IV SOLR
40.0000 mg | Freq: Every day | INTRAVENOUS | Status: DC
  Administered 2017-08-24 – 2017-08-30 (×7): 40 mg via INTRAVENOUS
  Filled 2017-08-23 (×7): qty 40

## 2017-08-23 MED ORDER — FENTANYL 2500MCG IN NS 250ML (10MCG/ML) PREMIX INFUSION
25.0000 ug/h | INTRAVENOUS | Status: DC
  Administered 2017-08-23: 50 ug/h via INTRAVENOUS
  Filled 2017-08-23: qty 250

## 2017-08-23 MED ORDER — FLUTICASONE PROPIONATE 50 MCG/ACT NA SUSP: 1.0000 | Freq: Every day | NASAL | Status: DC | PRN

## 2017-08-23 MED ORDER — ROCURONIUM BROMIDE 10 MG/ML (PF) SYRINGE
PREFILLED_SYRINGE | INTRAVENOUS | Status: DC | PRN
  Administered 2017-08-23: 50 mg via INTRAVENOUS

## 2017-08-23 MED ORDER — MORPHINE SULFATE (PF) 4 MG/ML IV SOLN
1.0000 mg | INTRAVENOUS | Status: DC | PRN
  Administered 2017-08-27 (×3): 1 mg via INTRAVENOUS
  Filled 2017-08-23 (×4): qty 1

## 2017-08-23 MED ORDER — PROPOFOL 1000 MG/100ML IV EMUL
0.0000 ug/kg/min | INTRAVENOUS | Status: DC
  Administered 2017-08-23 – 2017-08-24 (×3): 20 ug/kg/min via INTRAVENOUS
  Filled 2017-08-23 (×5): qty 100

## 2017-08-23 MED ORDER — GABAPENTIN 100 MG PO CAPS
100.0000 mg | ORAL_CAPSULE | Freq: Every day | ORAL | Status: DC
  Administered 2017-08-23 – 2017-08-29 (×5): 100 mg via ORAL
  Filled 2017-08-23 (×5): qty 1

## 2017-08-23 MED ORDER — ONDANSETRON HCL 4 MG/2ML IJ SOLN
INTRAMUSCULAR | Status: AC
Start: 2017-08-23 — End: ?
  Filled 2017-08-23: qty 2

## 2017-08-23 MED ORDER — PROPOFOL 1000 MG/100ML IV EMUL
INTRAVENOUS | Status: AC
  Filled 2017-08-23: qty 100

## 2017-08-23 MED ORDER — INSULIN ASPART 100 UNIT/ML ~~LOC~~ SOLN
2.0000 [IU] | SUBCUTANEOUS | Status: DC
  Administered 2017-08-23 – 2017-08-24 (×3): 2 [IU] via SUBCUTANEOUS
  Administered 2017-08-25 (×2): 4 [IU] via SUBCUTANEOUS
  Administered 2017-08-25: 2 [IU] via SUBCUTANEOUS
  Administered 2017-08-25: 4 [IU] via SUBCUTANEOUS
  Administered 2017-08-26 – 2017-08-29 (×2): 2 [IU] via SUBCUTANEOUS

## 2017-08-23 MED ORDER — PHENYLEPHRINE HCL 10 MG/ML IJ SOLN
INTRAVENOUS | Status: DC | PRN
  Administered 2017-08-23: 20 ug/min via INTRAVENOUS

## 2017-08-23 MED ORDER — ROCURONIUM BROMIDE 50 MG/5ML IV SOLN
50.0000 mg | Freq: Once | INTRAVENOUS | Status: DC
  Filled 2017-08-23: qty 5

## 2017-08-23 MED ORDER — LIDOCAINE 2% (20 MG/ML) 5 ML SYRINGE
INTRAMUSCULAR | Status: AC
Start: 2017-08-23 — End: ?
  Filled 2017-08-23: qty 5

## 2017-08-23 SURGICAL SUPPLY — 70 items
BENZOIN TINCTURE PRP APPL 2/3 (GAUZE/BANDAGES/DRESSINGS) IMPLANT
BLADE CLIPPER SURG (BLADE) ×2 IMPLANT
BLADE ULTRA TIP 2M (BLADE) ×2 IMPLANT
BNDG GAUZE ELAST 4 BULKY (GAUZE/BANDAGES/DRESSINGS) ×4 IMPLANT
BNDG STRETCH 4X75 NS LF (GAUZE/BANDAGES/DRESSINGS) ×2 IMPLANT
BUR ACORN 6.0 PRECISION (BURR) ×2 IMPLANT
BUR ADDG 1.1 (BURR) ×2 IMPLANT
BUR MATCHSTICK NEURO 3.0 LAGG (BURR) IMPLANT
BUR SPIRAL ROUTER 2.3 (BUR) IMPLANT
CANISTER SUCT 3000ML PPV (MISCELLANEOUS) ×2 IMPLANT
CARTRIDGE OIL MAESTRO DRILL (MISCELLANEOUS) ×1 IMPLANT
CLIP VESOCCLUDE MED 6/CT (CLIP) IMPLANT
DIFFUSER DRILL AIR PNEUMATIC (MISCELLANEOUS) ×2 IMPLANT
DRAPE NEUROLOGICAL W/INCISE (DRAPES) ×2 IMPLANT
DRAPE SURG 17X23 STRL (DRAPES) IMPLANT
DRAPE SURG IRRIG POUCH 19X23 (DRAPES) ×2 IMPLANT
DRAPE WARM FLUID 44X44 (DRAPE) ×2 IMPLANT
DURAPREP 26ML APPLICATOR (WOUND CARE) ×2 IMPLANT
DURAPREP 6ML APPLICATOR 50/CS (WOUND CARE) IMPLANT
ELECT REM PT RETURN 9FT ADLT (ELECTROSURGICAL) ×2
ELECTRODE REM PT RTRN 9FT ADLT (ELECTROSURGICAL) ×1 IMPLANT
EVACUATOR 1/8 PVC DRAIN (DRAIN) IMPLANT
EVACUATOR SILICONE 100CC (DRAIN) IMPLANT
GAUZE SPONGE 4X4 12PLY STRL (GAUZE/BANDAGES/DRESSINGS) ×2 IMPLANT
GAUZE SPONGE 4X4 12PLY STRL LF (GAUZE/BANDAGES/DRESSINGS) ×2 IMPLANT
GAUZE SPONGE 4X4 16PLY XRAY LF (GAUZE/BANDAGES/DRESSINGS) ×2 IMPLANT
GLOVE BIO SURGEON STRL SZ 6.5 (GLOVE) ×4 IMPLANT
GLOVE ECLIPSE 6.5 STRL STRAW (GLOVE) ×2 IMPLANT
GLOVE EXAM NITRILE LRG STRL (GLOVE) IMPLANT
GLOVE EXAM NITRILE XL STR (GLOVE) IMPLANT
GLOVE EXAM NITRILE XS STR PU (GLOVE) IMPLANT
GLOVE INDICATOR 7.5 STRL GRN (GLOVE) ×2 IMPLANT
GOWN STRL REUS W/ TWL LRG LVL3 (GOWN DISPOSABLE) ×2 IMPLANT
GOWN STRL REUS W/ TWL XL LVL3 (GOWN DISPOSABLE) ×2 IMPLANT
GOWN STRL REUS W/TWL 2XL LVL3 (GOWN DISPOSABLE) IMPLANT
GOWN STRL REUS W/TWL LRG LVL3 (GOWN DISPOSABLE) ×2
GOWN STRL REUS W/TWL XL LVL3 (GOWN DISPOSABLE) ×2
GRAFT DURAGEN MATRIX 2WX2L ×4 IMPLANT
HEMOSTAT SURGICEL 2X14 (HEMOSTASIS) ×2 IMPLANT
HOOK DURA 1/2IN (MISCELLANEOUS) ×2 IMPLANT
KIT BASIN OR (CUSTOM PROCEDURE TRAY) ×2 IMPLANT
KIT ROOM TURNOVER OR (KITS) ×2 IMPLANT
NEEDLE HYPO 25X1 1.5 SAFETY (NEEDLE) ×2 IMPLANT
NS IRRIG 1000ML POUR BTL (IV SOLUTION) ×6 IMPLANT
OIL CARTRIDGE MAESTRO DRILL (MISCELLANEOUS) ×2
PACK CRANIOTOMY CUSTOM (CUSTOM PROCEDURE TRAY) ×2 IMPLANT
PATTIES SURGICAL .5 X.5 (GAUZE/BANDAGES/DRESSINGS) IMPLANT
PATTIES SURGICAL .5 X3 (DISPOSABLE) IMPLANT
PATTIES SURGICAL 1X1 (DISPOSABLE) IMPLANT
PIN SKULL DORO ADULT (PIN) ×1 IMPLANT
PINS SKULL DORO ADULT (PIN) ×2
PLATE 1.5  2HOLE LNG NEURO (Plate) ×5 IMPLANT
PLATE 1.5 2HOLE LNG NEURO (Plate) ×5 IMPLANT
SCREW SELF DRILL HT 1.5/4MM (Screw) ×20 IMPLANT
SPONGE NEURO XRAY DETECT 1X3 (DISPOSABLE) IMPLANT
SPONGE SURGIFOAM ABS GEL 100 (HEMOSTASIS) ×2 IMPLANT
STAPLER VISISTAT (STAPLE) ×2 IMPLANT
STAPLER VISISTAT 35W (STAPLE) ×2 IMPLANT
SUT ETHILON 3 0 FSL (SUTURE) IMPLANT
SUT ETHILON 3 0 PS 1 (SUTURE) IMPLANT
SUT NURALON 4 0 TR CR/8 (SUTURE) ×4 IMPLANT
SUT STEEL 0 (SUTURE)
SUT STEEL 0 18XMFL TIE 17 (SUTURE) IMPLANT
SUT VIC AB 2-0 CT2 18 VCP726D (SUTURE) ×6 IMPLANT
TOWEL GREEN STERILE (TOWEL DISPOSABLE) ×2 IMPLANT
TOWEL GREEN STERILE FF (TOWEL DISPOSABLE) ×2 IMPLANT
TRAY FOLEY W/METER SILVER 16FR (SET/KITS/TRAYS/PACK) ×2 IMPLANT
TUBE CONNECTING 12X1/4 (SUCTIONS) ×4 IMPLANT
UNDERPAD 30X30 (UNDERPADS AND DIAPERS) ×2 IMPLANT
WATER STERILE IRR 1000ML POUR (IV SOLUTION) ×2 IMPLANT

## 2017-08-23 NOTE — Transfer of Care (Signed)
Immediate Anesthesia Transfer of Care Note  Patient: Destiny Day  Procedure(s) Performed: CRANIOTOMY HEMATOMA EVACUATION SUBDURAL (N/A Head)  Patient Location: ICU  Anesthesia Type:General  Level of Consciousness: unresponsive and Patient remains intubated per anesthesia plan  Airway & Oxygen Therapy: Patient remains intubated per anesthesia plan and Patient placed on Ventilator (see vital sign flow sheet for setting)  Post-op Assessment: Report given to RN and Post -op Vital signs reviewed and stable  Post vital signs: Reviewed and stable  Last Vitals:  Vitals:   08/23/17 1015 08/23/17 1020  BP: (!) 161/77 (!) 148/73  Pulse: (!) 105 (!) 120  Resp: (!) 27 (!) 30  SpO2: 99% 100%    Last Pain: There were no vitals filed for this visit.       Complications: No apparent anesthesia complications

## 2017-08-23 NOTE — Progress Notes (Signed)
Social Work  Discharge Note  The overall goal for the admission was met for:   Discharge location: No - transfer to acute emergently due to new SDH  Length of Stay: No  Discharge activity level: No  Home/community participation: No  Services provided included: MD, RD, PT, OT, RN, TR, Pharmacy and SW  Financial Services: Medicare and Private Insurance: [place]  Follow-up services arranged: NA - pt transferred to acute  Comments (or additional information): Prior to fall/ SDH, pt was believed to be on track to meet mod independent goals and would be ready for d/c home tomorrow.    Patient/Family verbalized understanding of follow-up arrangements:  NA  Individual responsible for coordination of the follow-up plan: NA  Confirmed correct DME delivered: NA    Destiny Day

## 2017-08-23 NOTE — Consult Note (Signed)
PULMONARY / CRITICAL CARE MEDICINE   Name: Destiny Day MRN: [id number] DOB: 07/15/1951    ADMISSION DATE:  08/23/2017 CONSULTATION DATE:  08/23/2017  REFERRING MD:  Ernst Spell MD, Posey Pronto  CHIEF COMPLAINT:  AMS and SDH  HISTORY OF PRESENT ILLNESS:   67 year old female with ESRD-HD who fell on 1/28 AM and suffered a SDH with acute AMS and respiratory failure for inability to protect her airway.  Patient is unresponsive and no history is available.  Patient is on LMW heparin prophylactic dose and plavix.  Patient was in the hospital for a toe amputation and a vascular procedure.   PAST MEDICAL HISTORY :  She  has a past medical history of Abdominal bruit, Anemia, Anxiety, Arthritis, Asthma, Cervical disc disease, CHF (congestive heart failure) ([place]), Complication of anesthesia, Diabetes mellitus, Diverticulitis, ESRD on peritoneal dialysis ([place]), GERD (gastroesophageal reflux disease), GI bleed (03/31/2013), History of hiatal hernia, Hyperlipidemia, Hypertension, Neuropathy, Osteoporosis, Peripheral vascular disease ([place]), Pneumonia, Seasonal allergies, Shortness of breath dyspnea, and Sleep apnea.  PAST SURGICAL HISTORY: She  has a past surgical history that includes Abdominal hysterectomy (1993`); Esophagogastroduodenoscopy (N/A, 03/31/2013); Eye surgery; Bascilic vein transposition (Left, 07/10/2014); Fistulogram (Left, 10/29/2014); Bascilic vein transposition (Right, 11/08/2014); Bascilic vein transposition (Right, 01/18/2015); Patch angioplasty (Right, 01/18/2015); Ligation of arteriovenous  fistula (Left, 04/21/2016); AV fistula placement (Left, 04/21/2016); Cardiac catheterization (N/A, 06/23/2016); Cardiac catheterization (06/23/2016); Femoral-popliteal Bypass Graft (Left, 07/08/2016); Amputation (Left, 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  Allergies  Allergen Reactions  . Lisinopril Cough  . Prednisone Other (See Comments)    Excessive fluid buildup    No current facility-administered medications on file prior to encounter.    Current Outpatient Medications on File Prior to Encounter  Medication Sig  . acetaminophen (TYLENOL) 500 MG tablet Take 500 mg by mouth every 6 (six) hours as needed.  Marland Kitchen albuterol (PROVENTIL HFA;VENTOLIN HFA) 108 (90 BASE) MCG/ACT inhaler Inhale 1-2 puffs into the lungs every 6 (six) hours as needed for wheezing or shortness of breath.  Marland Kitchen amLODipine (NORVASC) 10 MG tablet Take 10 mg by mouth at bedtime.   Marland Kitchen aspirin 81 MG chewable tablet Chew 81 mg by mouth every evening.   Marland Kitchen atorvastatin (LIPITOR) 20 MG tablet Take 20 mg by mouth at bedtime.   . budesonide-formoterol (SYMBICORT) 160-4.5 MCG/ACT inhaler Inhale 1 puff into the lungs daily as needed (for respiratory issues).   . calcium acetate (PHOSLO) 667 MG capsule Take 667-1,334 mg by mouth See admin instructions. 1 tablet twice daily with snacks. And 2 tablets 3 times daily with meals  . carvedilol (COREG) 12.5 MG tablet Take 12.5 mg by mouth 2 (two) times daily.   . cinacalcet (SENSIPAR) 30 MG tablet Take 30 mg by mouth daily with supper.   . clopidogrel (PLAVIX) 75 MG tablet TAKE 1 TABLET EVERY DAY  . diazepam (VALIUM) 5 MG tablet Take 5 mg by mouth every 12 (twelve) hours as needed for anxiety.  . ferric citrate (AURYXIA) 1 GM 210 MG(Fe) tablet Take 420 mg by mouth 3 (three) times daily with meals.  . fluticasone (FLONASE) 50 MCG/ACT nasal spray Place 1 spray into both nostrils daily as needed for allergies or rhinitis.  Marland Kitchen gabapentin (NEURONTIN) 100 MG capsule Take 1 capsule (100 mg total) by mouth at bedtime. When necessary for neuropathy pain  . hydrALAZINE (APRESOLINE) 25 MG tablet Take 1 tablet by mouth  See admin instructions. Take 1 tab 3 times a day on dialysis days and 1 tab twice daily on non dialysis days   . HYDROcodone-acetaminophen (NORCO) 5-325 MG tablet Take 1 tablet by mouth every 6 (six) hours as needed for moderate pain.  Marland Kitchen insulin lispro (HUMALOG) 100 UNIT/ML injection Inject 0-8 Units into the skin 3 (three) times daily before meals. 0-59 = hypoglycemic protocol, 60-149 = 0 units, 150-250 = 5 units, 251-300 = 8 units, >301 notify MD/NP  . isosorbide dinitrate (ISORDIL) 20 MG tablet Take 1 tablet by mouth See admin instructions. Take 1 tablet daily on dialysis days and 1 tab twice daily on non dialysis days  . LEVEMIR FLEXTOUCH 100 UNIT/ML Pen Inject 10 Units into the skin at bedtime.   Marland Kitchen loratadine (CLARITIN) 10 MG tablet Take 10 mg by mouth daily as needed for allergies.  . multivitamin (RENA-VIT) TABS tablet Take 1 tablet by mouth daily.   Marland Kitchen omeprazole (PRILOSEC) 20 MG capsule Take 20 mg by mouth daily.   . sucroferric oxyhydroxide (VELPHORO) 500 MG chewable tablet Chew 500-1,000 mg by mouth See admin instructions. Take 2 tablets (1000 mg) by mouth with meals and 1 tablet (500 mg) with snacks    FAMILY HISTORY:  Her indicated that her mother is deceased. She indicated that her father is deceased. She indicated that the status of her maternal grandmother is unknown. She indicated that the status of her neg hx is unknown.   SOCIAL HISTORY: She  reports that she quit smoking about 5 years ago. Her smoking use included cigarettes. She has a 14.00 pack-year smoking history. she has never used smokeless tobacco. She reports that she does not drink alcohol or use drugs.  REVIEW OF SYSTEMS:   Unattainable  SUBJECTIVE:  Unresponsive  VITAL SIGNS: There were no vitals taken for this visit.  HEMODYNAMICS:    VENTILATOR SETTINGS:    INTAKE / OUTPUT: No intake/output data recorded.  PHYSICAL EXAMINATION: General:  Chronically ill appearing female, unreponsive, NAD Neuro:  Unresponsive, not moving an ext to command HEENT:  [place]/AT, pupils sluggish and no EOM and no  gag. Cardiovascular:  RRR, Nl S1/S2, -M/R/G. Lungs:  CTA bilaterally Abdomen:  Soft, NT, ND and +BS Musculoskeletal:  -edema and -tenderness Skin:  Intact  LABS:  BMET Recent Labs  Lab 08/19/17 0710 08/20/17 1400  NA 133* 128*  K 4.0 4.0  CL 92* 89*  CO2 29 25  BUN 12 26*  CREATININE 3.51* 5.88*  GLUCOSE 137* 213*    Electrolytes Recent Labs  Lab 08/19/17 0710 08/20/17 1400  CALCIUM 9.4 9.4  PHOS 1.5* 2.2*    CBC Recent Labs  Lab 08/19/17 0710 08/20/17 1400  WBC 12.0* 11.1*  HGB 10.2* 9.0*  HCT 31.8* 28.8*  PLT 374 417*    Coag's No results for input(s): APTT, INR in the last 168 hours.  Sepsis Markers No results for input(s): LATICACIDVEN, PROCALCITON, O2SATVEN in the last 168 hours.  ABG No results for input(s): PHART, PCO2ART, PO2ART in the last 168 hours.  Liver Enzymes Recent Labs  Lab 08/19/17 0710 08/20/17 1400  ALBUMIN 3.0* 2.8*    Cardiac Enzymes No results for input(s): TROPONINI, PROBNP in the last 168 hours.  Glucose Recent Labs  Lab 08/21/17 2159 08/22/17 0640 08/22/17 1205 08/22/17 1641 08/22/17 2044 08/23/17 0614  GLUCAP 146* 150* 131* 176* 172* 134*    Imaging Ct Head Wo Contrast  Result Date: 08/23/2017 CLINICAL DATA:  Fall.  Head injury.  Patient  is on anticoagulation. EXAM: CT HEAD WITHOUT CONTRAST TECHNIQUE: Contiguous axial images were obtained from the base of the skull through the vertex without intravenous contrast. COMPARISON:  CT head without contrast 03/31/2013. FINDINGS: Brain: A large left subdural hematoma is present. The hematoma measures up to 15 mm on coronal images. There is 10 mm of left-to-right midline shift at the foramen of Monro. There is significant mass effect with effacement of the left lateral ventricle. No significant subarachnoid hemorrhage is present. No intraventricular hemorrhage is present. Midline shift results in developing uncal herniation. The posterior fossa is otherwise intact. No  acute infarct is present.  Basal ganglia are intact. Vascular: Vascular calcifications are present within the cavernous internal carotid arteries bilaterally. There is no hyperdense vessel. Skull: Calvarium is intact. No acute fractures present. No significant extracranial injury is present. Sinuses/Orbits: Mild mucosal thickening is present in the sphenoid sinuses bilaterally. There is some fluid in the right mastoid air cells. Left mastoid air cells are clear. The remaining paranasal sinuses are clear. The globes and orbits are within normal limits. IMPRESSION: 1. Large left subdural hematoma measuring up to 15 mm. 2. Significant mass effect with effacement of the left lateral ventricle in 10 mm of left-to-right midline shift. 3. Developing uncal herniation without other downward herniation in the posterior fossa. 4. Atherosclerosis. Critical Value/emergent results were called by telephone at the time of interpretation on 08/23/2017 at 9:16 am to Reesa Chew, NP, who verbally acknowledged these results. Use Electronically Signed   By: San Morelle M.D.   On: 08/23/2017 09:19     STUDIES:  CT of the head 1/28 with large SDH  CULTURES: None  ANTIBIOTICS: None  SIGNIFICANT EVENTS: 1/28 fall and SDH to the OR  LINES/TUBES: ETT 1/28>>> PIV  DISCUSSION: 67 year old female with ESRD-HD history presenting to PCCM in respiratory failure after a fall and SDH.  ASSESSMENT / PLAN:  PULMONARY A: VDRF due to inability to protect her airway P:   - Intubate - Full vent support - ABG and CXR  CARDIOVASCULAR A:  HTN post head injury P:  - Cardene drip - Target SBP of 150 or per neurosurg - BMET in AM - Replace electrolytes as indicated  RENAL A:   ESRD P:   - HD per renal  GASTROINTESTINAL A:   No active issues P:   - NPO - TF in AM  HEMATOLOGIC A:   No active issues P:  - CBC in AM - Transfuse per ICU protocol  INFECTIOUS A:   No active issues P:   - Monitor  WBC and fever curve  ENDOCRINE A:   DM   P:   - ISS - CBGs  NEUROLOGIC A:   Acute SDH P:   RASS goal: 0 - PRN fentanyl - To the OR for evacuation - F/u post OR visit   FAMILY  - Updates: Husband updated bedside  - Inter-disciplinary family meet or Palliative Care meeting due by:  day 7  The patient is critically ill with multiple organ systems failure and requires high complexity decision making for assessment and support, frequent evaluation and titration of therapies, application of advanced monitoring technologies and extensive interpretation of multiple databases.   Critical Care Time devoted to patient care services described in this note is  35  Minutes. This time reflects time of care of this signee Dr Jennet Maduro. This critical care time does not reflect procedure time, or teaching time or supervisory time of PA/NP/Med student/Med  Resident etc but could involve care discussion time.  Rush Farmer, M.D. [facility] Pulmonary/Critical Care Medicine. Pager: [phone]. After hours pager: [phone].  08/23/2017, 10:00 AM

## 2017-08-23 NOTE — Anesthesia Procedure Notes (Addendum)
Arterial Line Insertion Start/End1/28/2019 10:30 AM, 08/23/2017 10:33 AM Performed by: Verdie Drown, CRNA, CRNA  Patient location: OR. Preanesthetic checklist: patient identified, IV checked, site marked, risks and benefits discussed, surgical consent, monitors and equipment checked, pre-op evaluation and anesthesia consent Patient sedated Right, radial was placed Catheter size: 20 G Hand hygiene performed  and maximum sterile barriers used   Attempts: 1 Procedure performed without using ultrasound guided technique. Ultrasound Notes:anatomy identified, needle tip was noted to be adjacent to the nerve/plexus identified and no ultrasound evidence of intravascular and/or intraneural injection Following insertion, dressing applied. Post procedure assessment: normal  Patient tolerated the procedure well with no immediate complications.

## 2017-08-23 NOTE — Anesthesia Procedure Notes (Addendum)
Date/Time: 08/23/2017 10:27 AM Performed by: Harden Mo, CRNA Pre-anesthesia Checklist: Patient identified, Emergency Drugs available, Suction available and Patient being monitored Patient Re-evaluated:Patient Re-evaluated prior to induction Oxygen Delivery Method: Circle System Utilized Induction Type: Inhalational induction Placement Confirmation: positive ETCO2 and breath sounds checked- equal and bilateral Tube secured with: Tape Dental Injury: Teeth and Oropharynx as per pre-operative assessment

## 2017-08-23 NOTE — Progress Notes (Addendum)
Received a call from the nurse about SDH seen on CT. Patient was alert with complaints of HA but stable during transfer to CT. Per reports, she developed N/V while in CT.  Nurse went down to get the patient and reported that she was definitely different.  Received a call from radiology--patient with R-SDH with 10 mm shift and developing hermiation. NS was paged to evaluate and transfer patient to acute.   Rapid response activated while awaiting call back.  Examined patient when she returned and she was somnolent. Was unable to answer questions with left inattention and verbal output limited to mumbling. BP elevated and  episodic gagging noted. Orders given for 2 saline locks,  IV hydralazine 10 mg as well IV Zofran. Limited IV access due to limited LUE .   While awaiting call back, CCM consulted due to concerns of patient's ability to protect airway. Husband did arrive during current events and was updated on situation. Dr. Nelda Marseille evaluated patient and recommended intubation due to lack of gag reflex and at risk for aspiration. She was discharged to neuro ICU for treatment and monitoring.

## 2017-08-23 NOTE — Progress Notes (Signed)
RT retracted patient's ETT 2cm per MD order. No complications. Vital signs stable. RT will continue to monitor.

## 2017-08-23 NOTE — Procedures (Signed)
Intubation Procedure Note NANAMI WHITELAW [id number] 1950-08-09  Procedure: Intubation Indications: Airway protection and maintenance  Procedure Details Consent: Risks of procedure as well as the alternatives and risks of each were explained to the (patient/caregiver).  Consent for procedure obtained. Time Out: Verified patient identification, verified procedure, site/side was marked, verified correct patient position, special equipment/implants available, medications/allergies/relevent history reviewed, required imaging and test results available.  Performed  Maximum sterile technique was used including gloves, hand hygiene and mask.  MAC    Evaluation Hemodynamic Status: BP stable throughout; O2 sats: stable throughout Patient's Current Condition: stable Complications: No apparent complications Patient did tolerate procedure well. Chest X-ray ordered to verify placement.  CXR: pending.   Jennet Maduro 08/23/2017

## 2017-08-23 NOTE — Significant Event (Addendum)
Called fromradiology re significant bleed noted with CT. Down to rad to pick up pt and PA notified of new results.   IMPRESSION: 1. Large left subdural hematoma measuring up to 15 mm. 2. Significant mass effect with effacement of the left lateral ventricle in 10 mm of left-to-right midline shift. 3. Developing uncal herniation without other downward herniation in the posterior fossa. 4. Atherosclerosis.  Critical Value/emergent results were called by telephone at the time of interpretation on 08/23/2017 at 9:16 am to Reesa Chew, NP, who verbally acknowledged these results.   Met on floor with new orders. RRT called re event. Pt is significantly different than when she went down ~45 minutes ago. Very lethargic, eyes closed, not speaking except with sternal rub or tactile cues. Will open eyes to command and mumbling answers to questions. New orders received and care team to room to complete. Order for transfer to ICU stat placed along with IV site insertion STAT for hydralazine and Zofran. No IV access obtained. Called report to 4 N and pt transferred via bed by RRT and team Dr. Theo Dills, etc. Spouse at bedside concerned about sudden change and asking lots of questions about status. MD updated him on plan of care. Margarito Liner

## 2017-08-23 NOTE — Procedures (Signed)
OGT Placement by MD  Placed under direct laryngoscopy and verified by auscultation  Rush Farmer, M.D. [facility] Pulmonary/Critical Care Medicine. Pager: [phone]. After hours pager: [phone]

## 2017-08-23 NOTE — Anesthesia Postprocedure Evaluation (Signed)
Anesthesia Post Note  Patient: Destiny Day  Procedure(s) Performed: CRANIOTOMY HEMATOMA EVACUATION SUBDURAL (N/A Head)     Patient location during evaluation: SICU Anesthesia Type: General Level of consciousness: sedated Pain management: pain level controlled Vital Signs Assessment: post-procedure vital signs reviewed and stable Respiratory status: patient remains intubated per anesthesia plan Cardiovascular status: stable Postop Assessment: no apparent nausea or vomiting Anesthetic complications: no    Last Vitals:  Vitals:   08/23/17 1300 08/23/17 1400  BP: (!) 138/46 (!) 151/52  Pulse:    Resp: (!) 30 (!) 30  SpO2: 100% 100%    Last Pain: There were no vitals filed for this visit.               Ryan P Ellender

## 2017-08-23 NOTE — Progress Notes (Signed)
[place] PHYSICAL MEDICINE & REHABILITATION     PROGRESS NOTE  Subjective/Complaints:  Pt seen lying in bed this AM.  She slept well overnight.  Per nursing pt with fall this AM, landing on her head.  Later notified of rapid decline in neurological status.   ROS: Denies CP, SOB, N/V/D.  Objective: Vital Signs: Blood pressure (!) 198/153, pulse 77, temperature 98.2 F (36.8 C), temperature source Oral, resp. rate (!) 22, height 5\' 5"  (1.651 m), weight 69.2 kg (152 lb 8.9 oz), SpO2 100 %. Ct Head Wo Contrast  Result Date: 08/23/2017 CLINICAL DATA:  Fall.  Head injury.  Patient is on anticoagulation. EXAM: CT HEAD WITHOUT CONTRAST TECHNIQUE: Contiguous axial images were obtained from the base of the skull through the vertex without intravenous contrast. COMPARISON:  CT head without contrast 03/31/2013. FINDINGS: Brain: A large left subdural hematoma is present. The hematoma measures up to 15 mm on coronal images. There is 10 mm of left-to-right midline shift at the foramen of Monro. There is significant mass effect with effacement of the left lateral ventricle. No significant subarachnoid hemorrhage is present. No intraventricular hemorrhage is present. Midline shift results in developing uncal herniation. The posterior fossa is otherwise intact. No acute infarct is present.  Basal ganglia are intact. Vascular: Vascular calcifications are present within the cavernous internal carotid arteries bilaterally. There is no hyperdense vessel. Skull: Calvarium is intact. No acute fractures present. No significant extracranial injury is present. Sinuses/Orbits: Mild mucosal thickening is present in the sphenoid sinuses bilaterally. There is some fluid in the right mastoid air cells. Left mastoid air cells are clear. The remaining paranasal sinuses are clear. The globes and orbits are within normal limits. IMPRESSION: 1. Large left subdural hematoma measuring up to 15 mm. 2. Significant mass effect with  effacement of the left lateral ventricle in 10 mm of left-to-right midline shift. 3. Developing uncal herniation without other downward herniation in the posterior fossa. 4. Atherosclerosis. Critical Value/emergent results were called by telephone at the time of interpretation on 08/23/2017 at 9:16 am to Reesa Chew, NP, who verbally acknowledged these results. Use Electronically Signed   By: San Morelle M.D.   On: 08/23/2017 09:19   Recent Labs    08/20/17 1400  WBC 11.1*  HGB 9.0*  HCT 28.8*  PLT 417*   Recent Labs    08/20/17 1400  NA 128*  K 4.0  CL 89*  GLUCOSE 213*  BUN 26*  CREATININE 5.88*  CALCIUM 9.4   CBG (last 3)  Recent Labs    08/22/17 1641 08/22/17 2044 08/23/17 0614  GLUCAP 176* 172* 134*    Wt Readings from Last 3 Encounters:  08/23/17 69.2 kg (152 lb 8.9 oz)  08/16/17 66 kg (145 lb 8.1 oz)  05/25/17 68.5 kg (151 lb)    Physical Exam:  BP (!) 198/153 (BP Location: Right Arm)   Pulse 77   Temp 98.2 F (36.8 C) (Oral)   Resp (!) 22   Ht 5\' 5"  (1.651 m)   Wt 69.2 kg (152 lb 8.9 oz)   SpO2 100%   BMI 25.39 kg/m  Constitutional: She appears well-developed and well-nourished. No distress.  HENT: Laceration right lateral forehead. Eyes: EOM are normal. No discharge.  Cardiovascular: RRR. No JVD. Respiratory: Effort normal and breath sounds normal.  GI: Bowel sounds are normal. She exhibits no distension.  Musculoskeletal: Min edema right shin and right foot. 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  Motor: B/l UE 5/5 proximal to distal LLE: 4+/5 proximal to distal RLE: HF 4-/5, KE 4-/5, ankle dressed (pain inhibition, unchanged)  Skin: Skin is warm and dry. She is not diaphoretic.  Dressing right foot c/d/i   Right thigh incisions with c/d/i Psychiatric: She has a normal mood and affect. Her behavior is normal. Judgment and thought content normal.   Assessment/Plan: 1. Functional deficits secondary  to RLE 1st digit amputation with history of left TMA which require 3+ hours per day of interdisciplinary therapy in a comprehensive inpatient rehab setting. Physiatrist is providing close team supervision and 24 hour management of active medical problems listed below. Physiatrist and rehab team continue to assess barriers to discharge/monitor patient progress toward functional and medical goals.  Function:  Bathing Bathing position   Position: Wheelchair/chair at sink  Bathing parts Body parts bathed by patient: Right arm, Left arm, Chest, Front perineal area, Right upper leg, Left upper leg, Left lower leg, Abdomen, Buttocks Body parts bathed by helper: Back  Bathing assist        Upper Body Dressing/Undressing Upper body dressing   What is the patient wearing?: Pull over shirt/dress     Pull over shirt/dress - Perfomed by patient: Thread/unthread right sleeve, Thread/unthread left sleeve, Put head through opening, Pull shirt over trunk          Upper body assist Assist Level: Set up      Lower Body Dressing/Undressing Lower body dressing   What is the patient wearing?: Underwear, [employer], Shoes, Non-skid slipper socks, Pants Underwear - Performed by patient: Thread/unthread left underwear leg, Pull underwear up/down, Thread/unthread right underwear leg Underwear - Performed by helper: Thread/unthread right underwear leg Pants- Performed by patient: Thread/unthread right pants leg, Thread/unthread left pants leg, Pull pants up/down           Shoes - Performed by patient: Don/doff left shoe, Fasten left Shoes - Performed by helper: Don/doff right shoe, Fasten right     TED Hose - Performed by patient: Don/doff left TED hose    Lower body assist Assist for lower body dressing: Touching or steadying assistance (Pt > 75%)      Toileting Toileting   Toileting steps completed by patient: Adjust clothing prior to toileting, Performs perineal hygiene, Adjust clothing after  toileting Toileting steps completed by helper: Adjust clothing after toileting    Toileting assist Assist level: Touching or steadying assistance (Pt.75%)   Transfers Chair/bed transfer   Chair/bed transfer method: Stand pivot Chair/bed transfer assist level: Supervision or verbal cues Chair/bed transfer assistive device: Medical sales representative     Max distance: 150 ft Assist level: Supervision or verbal cues   Wheelchair   Type: Manual Max wheelchair distance: 50 Assist Level: Supervision or verbal cues  Cognition Comprehension Comprehension assist level: Follows complex conversation/direction with no assist  Expression Expression assist level: Expresses complex ideas: With no assist  Social Interaction Social Interaction assist level: Interacts appropriately with others with medication or extra time (anti-anxiety, antidepressant).  Problem Solving Problem solving assist level: Solves complex 90% of the time/cues < 10% of the time  Memory Memory assist level: Recognizes or recalls 90% of the time/requires cueing < 10% of the time    Medical Problem List and Plan: 1.  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   Weekend notes reviewed.   Pt with rapid decline in neurological status, head CT reviewed,  showing large SDH with mass effect after fall on head.  Discussed with CCM, paged Neurosurg.  2.  DVT Prophylaxis/Anticoagulation: Pharmaceutical: Heparin 3. Pain Management: Hydrocodone prn effective 4. Mood: LCSW to follow for evaluation and support when appropriate 5. Neuropsych: This patient is capable of making decisions on her own behalf. 6. Skin/Wound Care: Routine pressure relief measures. Nephro for supplement to help promote healing.    Incisions healing 7. Fluids/Electrolytes/Nutrition: Strict I/Os with daily weights. Renal/CM diet with 1200 FR. 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  8. T2DM:  Monitor BS ac/hs. Continue Levemir daily.   Relatively controlled on 1/28 9. ESRD: Continue HD MWF --schedule at end of day to help with activity tolerance and therapy. 10. Asthma: Respiratory status stable on Dulera bid.  11. PAD s/p FPBG with right great toe amputation: Monitor wounds daily. Monitor drainage from proximal incisions. 12. Anxiety disorder: Managed with diazepam bid prn. 13. Diastolic CHF/HTN: Monitor BP bid. Continue Isordil bid. On lipitor at bedtime. Hydralazine on hold.  Filed Weights   08/21/17 0516 08/22/17 0500 08/23/17 0600  Weight: 68.3 kg (150 lb 9.2 oz) 70 kg (154 lb 5.2 oz) 69.2 kg (152 lb 8.9 oz)    Appreciate Neprho recs 14. Acute on chronic anemia: On aranesp MWF with HD.    Hb 9.0 on 1/25   Cont to monitor 15. HTN   Cont meds   Labile with HD, recs per Nephro   Was relatively controlled, rapidly increasing 16. Leukocytosis   WBCs 11.1 on 1/25   Labs with HD   Afebrile   Cont to monitor      LOS (Days) 7 A FACE TO FACE EVALUATION WAS PERFORMED  Ankit Lorie Phenix 08/23/2017 9:38 AM

## 2017-08-23 NOTE — Procedures (Signed)
Central Venous Catheter Insertion Procedure Note ESTELENE CARMACK [id number] Jan 03, 1951  Procedure: Insertion of Central Venous Catheter Indications: Assessment of intravascular volume, Drug and/or fluid administration and Frequent blood sampling  Procedure Details Consent: Risks of procedure as well as the alternatives and risks of each were explained to the (patient/caregiver).  Consent for procedure obtained. Time Out: Verified patient identification, verified procedure, site/side was marked, verified correct patient position, special equipment/implants available, medications/allergies/relevent history reviewed, required imaging and test results available.  Performed  Maximum sterile technique was used including antiseptics, cap, gloves, gown, hand hygiene, mask and sheet. Skin prep: Chlorhexidine; local anesthetic administered A antimicrobial bonded/coated triple lumen catheter was placed in the left internal jugular vein using the Seldinger technique.  Evaluation Blood flow good Complications: No apparent complications Patient did tolerate procedure well. Chest X-ray ordered to verify placement.  CXR: pending.  Rehmat Murtagh 08/23/2017, 2:06 PM

## 2017-08-23 NOTE — H&P (Signed)
There were no vitals taken for this visit. Destiny Day is an inpatient whom fell this morning. Head CT revealed a large Acute subdural hematoma on the left side causing mass effect, midline shift left to right, and impending herniation.   Allergies  Allergen Reactions  . Lisinopril Cough  . Prednisone Other (See Comments)    Excessive fluid buildup   Past Surgical History:  Procedure Laterality Date  . A/V SHUNTOGRAM N/A 09/22/2016   Procedure: A/V Shuntogram - left arm;  Surgeon: Serafina Mitchell, MD;  Location: [place];  Service: Cardiovascular;  Laterality: N/A;  . ABDOMINAL HYSTERECTOMY  1993`  . AMPUTATION Left 09/01/2016   Procedure: LEFT FOOT TRANSMETATARSAL AMPUTATION;  Surgeon: Newt Minion, MD;  Location: [place];  Service: Orthopedics;  Laterality: Left;  . AMPUTATION Right 08/11/2017   Procedure: RIGHT GREAT TOE AMPUTATION DIGIT;  Surgeon: Rosetta Posner, MD;  Location: [facility] OR;  Service: Vascular;  Laterality: Right;  . AV FISTULA PLACEMENT Left 04/21/2016   Procedure: INSERTION OF ARTERIOVENOUS (AV) GORE-TEX GRAFT ARM LEFT;  Surgeon: Elam Dutch, MD;  Location: [place];  Service: Vascular;  Laterality: Left;  . [place] TRANSPOSITION Left 07/10/2014   Procedure: BASCILIC VEIN TRANSPOSITION;  Surgeon: Angelia Mould, MD;  Location: [facility];  Service: Vascular;  Laterality: Left;  . BASCILIC VEIN TRANSPOSITION Right 11/08/2014   Procedure: FIRST STAGE BASILIC VEIN TRANSPOSITION;  Surgeon: Angelia Mould, MD;  Location: [place];  Service: Vascular;  Laterality: Right;  . BASCILIC VEIN TRANSPOSITION Right 01/18/2015   Procedure: SECOND STAGE BASILIC VEIN TRANSPOSITION;  Surgeon: Angelia Mould, MD;  Location: [facility] OR;  Service: Vascular;  Laterality: Right;  . ESOPHAGOGASTRODUODENOSCOPY N/A 03/31/2013   Procedure: ESOPHAGOGASTRODUODENOSCOPY (EGD);  Surgeon: Gatha Mayer, MD;  Location: [facility] ENDOSCOPY;  Service: Endoscopy;  Laterality: N/A;  . EYE SURGERY      laser surgery  . FEMORAL-POPLITEAL BYPASS GRAFT Left 07/08/2016   Procedure: LEFT  FEMORAL-BELOW KNEE POPLITEAL ARTERY BYPASS GRAFT USING 6MM X 80 CM PROPATEN GORETEX GRAFT WITH RINGS.;  Surgeon: Rosetta Posner, MD;  Location: [place];  Service: Vascular;  Laterality: Left;  . FEMORAL-POPLITEAL BYPASS GRAFT Right 08/11/2017   Procedure: RIGHT FEMORAL TO BELOW KNEE POPLITEAL ARTERKY  BYPASS GRAFT USING 6MM RINGED PROPATEN GRAFT;  Surgeon: Rosetta Posner, MD;  Location: [place];  Service: Vascular;  Laterality: Right;  . FISTULOGRAM Left 10/29/2014   Procedure: FISTULOGRAM;  Surgeon: Angelia Mould, MD;  Location: [facility] CATH LAB;  Service: Cardiovascular;  Laterality: Left;  . LIGATION OF ARTERIOVENOUS  FISTULA Left 04/21/2016   Procedure: LIGATION OF ARTERIOVENOUS  FISTULA LEFT ARM;  Surgeon: Elam Dutch, MD;  Location: [place];  Service: Vascular;  Laterality: Left;  . LOWER EXTREMITY ANGIOGRAPHY N/A 04/06/2017   Procedure: Lower Extremity Angiography - Right;  Surgeon: Serafina Mitchell, MD;  Location: [place] [facility];  Service: Cardiovascular;  Laterality: N/A;  . PATCH ANGIOPLASTY Right 01/18/2015   Procedure: BASILIC VEIN PATCH ANGIOPLASTY USING VASCUGUARD PATCH;  Surgeon: Angelia Mould, MD;  Location: [place];  Service: Vascular;  Laterality: Right;  . PERIPHERAL VASCULAR BALLOON ANGIOPLASTY  09/22/2016   Procedure: Peripheral Vascular Balloon Angioplasty;  Surgeon: Serafina Mitchell, MD;  Location: [place] [facility];  Service: Cardiovascular;;  Lt. Fistula  . PERIPHERAL VASCULAR CATHETERIZATION N/A 06/23/2016   Procedure: Abdominal Aortogram w/Lower Extremity;  Surgeon: Serafina Mitchell, MD;  Location: [place] [facility];  Service:  Cardiovascular;  Laterality: N/A;  . PERIPHERAL VASCULAR CATHETERIZATION  06/23/2016   Procedure: Peripheral Vascular Intervention;  Surgeon: Serafina Mitchell, MD;  Location: [place] [facility];  Service: Cardiovascular;;  lt common and external illiac artery    Past Medical History:  Diagnosis Date  . Abdominal bruit   . Anemia   . Anxiety   . Arthritis    Osteoarthritis  . Asthma   . Cervical disc disease    "pinced nerve"  . CHF (congestive heart failure) ([place])   . Complication of anesthesia    " after I got home from my last procedure, I started itching."  . Diabetes mellitus    Type II  . Diverticulitis   . ESRD on peritoneal dialysis [facility])    Hemodialysis - MWF- [place]   . GERD (gastroesophageal reflux disease)    from medications  . GI bleed 03/31/2013  . History of hiatal hernia   . Hyperlipidemia   . Hypertension   . Neuropathy    left leg  . Osteoporosis   . Peripheral vascular disease ([place])   . Pneumonia    "very young"  . Seasonal allergies   . Shortness of breath dyspnea    WIth exertion  . Sleep apnea    can't afford cpap   Family History  Problem Relation Age of Onset  . Other Mother        not sure of cause of death  . Diabetes Father   . Pancreatic cancer Maternal Grandmother   . Colon cancer Neg Hx    Social History   Socioeconomic History  . Marital status: Married    Spouse name: [name]  . Number of children: 4  . Years of education: some collg  . Highest education level: Not on file  Social Needs  . Financial resource strain: Not on file  . Food insecurity - worry: Not on file  . Food insecurity - inability: Not on file  . Transportation needs - medical: Not on file  . Transportation needs - non-medical: Not on file  Occupational History  . Occupation: Retired  Tobacco Use  . Smoking status: Former Smoker    Packs/day: 0.35    Years: 40.00    Pack years: 14.00    Types: Cigarettes    Last attempt to quit: 05/10/2012    Years since quitting: 5.2  . Smokeless tobacco: Never Used  Substance and Sexual Activity  . Alcohol use: No  . Drug use: No    Comment: marijuana; quit in early 1980's  . Sexual activity: Yes  Other Topics Concern  . Not on file  Social History Narrative    Patient lives at home with spouse.   Caffeine Use: 1 cup daily   Patient has been paralyzed and intubated. Patient was noted to be nonresponsive. She will be taken emergently to the OR for decompressive craniotomy.

## 2017-08-23 NOTE — Discharge Summary (Signed)
Physician Discharge Summary  Patient ID: Destiny Day MRN: [id number] DOB/AGE: 09-25-1950 67 y.o.  Admit date: 08/16/2017 Discharge date: 08/24/2017  Discharge Diagnoses:  Principal Problem:   SDH (subdural hematoma) (HCC) Active Problems:   Debility   Generalized anxiety disorder   Leukocytosis   Labile blood pressure   Diabetes mellitus type 2 in nonobese [facility])   Fall   Discharged Condition: critical   Significant Diagnostic Studies:  Ct Head Wo Contrast  Result Date: 08/23/2017 CLINICAL DATA:  Fall.  Head injury.  Patient is on anticoagulation. EXAM: CT HEAD WITHOUT CONTRAST TECHNIQUE: Contiguous axial images were obtained from the base of the skull through the vertex without intravenous contrast. COMPARISON:  CT head without contrast 03/31/2013. FINDINGS: Brain: A large left subdural hematoma is present. The hematoma measures up to 15 mm on coronal images. There is 10 mm of left-to-right midline shift at the foramen of Monro. There is significant mass effect with effacement of the left lateral ventricle. No significant subarachnoid hemorrhage is present. No intraventricular hemorrhage is present. Midline shift results in developing uncal herniation. The posterior fossa is otherwise intact. No acute infarct is present.  Basal ganglia are intact. Vascular: Vascular calcifications are present within the cavernous internal carotid arteries bilaterally. There is no hyperdense vessel. Skull: Calvarium is intact. No acute fractures present. No significant extracranial injury is present. Sinuses/Orbits: Mild mucosal thickening is present in the sphenoid sinuses bilaterally. There is some fluid in the right mastoid air cells. Left mastoid air cells are clear. The remaining paranasal sinuses are clear. The globes and orbits are within normal limits. IMPRESSION: 1. Large left subdural hematoma measuring up to 15 mm. 2. Significant mass effect with effacement of the left lateral ventricle in 10  mm of left-to-right midline shift. 3. Developing uncal herniation without other downward herniation in the posterior fossa. 4. Atherosclerosis. Critical Value/emergent results were called by telephone at the time of interpretation on 08/23/2017 at 9:16 am to Reesa Chew, NP, who verbally acknowledged these results. Use Electronically Signed   By: San Morelle M.D.   On: 08/23/2017 09:19     Labs:  Basic Metabolic Panel: Lab 70/01/74 0710 08/20/17 1400  NA 133* 128*  K 4.0 4.0  CL 92* 89*  CO2 29 25  GLUCOSE 137* 213*  BUN 12 26*  CREATININE 3.51* 5.88*  CALCIUM 9.4 9.4  PHOS 1.5* 2.2*    CBC: Recent Labs  Lab 08/20/17 1400 08/23/17 1037 08/23/17 1228 08/24/17 1400  WBC 11.1*  --  13.8* 14.6*  NEUTROABS  --   --  11.2*  --   HGB 9.0* 8.8* 7.1* 6.1*  HCT 28.8* 26.0* 21.6* 18.6*  MCV 100.7*  --  99.1 100.5*  PLT 417*  --  449* 407*    CBG: Recent Labs  Lab 08/23/17 1959 08/23/17 2340 08/24/17 0336 08/24/17 0826 08/24/17 1214  GLUCAP 121* 106* 74 109* 147*    Brief HPI:   Destiny Day a 67 y.o.femalewith history of ESRD- HD, diastolic congestive heart failure, diabetes mellitus, HTN, PVD s/p 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 History taken from chrat review and patient. She was admitted on 08/11/17 with ischemic right foot with gangrenous changes and underwent right femoral to below knee BG and right great toe amputation by Dr. Donnetta Hutching.  Post op WBAT. She has had some drainage from right thigh incisions  and reactive leucocytosis resolving. ABLA was being monitored and with use of aranesp for anemia of chronic disease. Therapy ongoing and  patient with functional deficits. CIR recommended for follow up therapy.    Hospital Course: Destiny Day was admitted to rehab 08/16/2017 for inpatient therapies to consist of PT and OT at least three hours five days a week. Past admission physiatrist, therapy team and rehab RN have worked together to  provide customized collaborative inpatient rehab. Right Fem-pop incision and right great toe amputation sites were healing well without signs of symptoms of infection. No signs of bleeding noted on Plavix. HD was ongoing at the end of the day to help with activity tolerance. Gabapentin was titrated upwards to help with neuropathy. Velphoro was discontinued due to issues with N/V due to intolerance of medication. H/H was monitored with serial checks and has been stable. Leucocytosis was resolving. Diabetes has been monitored with ac/hs cbg checks and blood sugars have been controlled on levemir. Blood pressures were controlled off hydralazine. She was making steady progress in therapy and was set for discharge on 01/29.    On early am of 1/28, patient was found on the floor with laceration on right lateral forehead. she was alert and appropriate without neurologic changes. CT head ordered for work up and she developed N/V with decline in [name] while in radiology. She was found to have large left  SDH with left to right shift. Neurosurgery was contacted for transfer to acute. Dr. Nelda Marseille was contacted for assistance due to somnolence and concerns of ability to protect airway. She was discharged to Neuro ICU for intubation and surgical intervention.    Rehab course: During patient's stay in rehab weekly team conferences were held to monitor patient's progress, set goals and discuss barriers to discharge. At admission, patient required Mod assist with ADL tasks and mobility. She  has had improvement in activity tolerance, balance, postural control as well as ability to compensate for deficits. She required set up assist with ADL tasks and min assist for IADLs.  She required supervision with transfers and to ambulate 100' with RW.    Medications at discharge:  1. Norvasc 5 mg po daily 2. ASA 81 mg po daily. 3. Plavix 75 mg po daily. 4. Lipitor 20 mg po with supper. 5. Dulcolax 5 mg po daily. 6. Valium 5 mg po  bid prn 7. Hectorol 6 mcg MWF with HD. 8. Neurontin 100 mg po tid. 9. Norco 5/325 mg 1-2 tabs po every 4 hours prn severe pain. 10. Isordil 20 mg po bid. 11. Dulera 200/5 mcg 2 puffs bid. 12. Renavite one pill daily. 13. Senna S 2 tabs daily at bedtime. 14. Miralax 17 gram bid.  Diet: Carb modified/Renal- 1200 cc FR.   Disposition: Neuro ICU   Follow-up Information    Jamse Arn, MD Follow up.   Specialty:  Physical Medicine and Rehabilitation Why:  Office will call you with follow up appointment Contact information: [address] [place] [address] [phone]        Rosetta Posner, MD Follow up.   Specialties:  Vascular Surgery, Cardiology Contact information: [address] [place] [address] [phone]           Signed: Bary Leriche 08/24/2017, 3:04 PM

## 2017-08-23 NOTE — Anesthesia Preprocedure Evaluation (Signed)
Anesthesia Evaluation  Patient identified by MRN, date of birth, ID band Patient awake    Reviewed: Allergy & Precautions, NPO status , Patient's Chart, lab work & pertinent test results  Airway Mallampati: II  TM Distance: >3 FB Neck ROM: Full    Dental  (+) Edentulous Upper, Poor Dentition   Pulmonary shortness of breath, asthma , sleep apnea , former smoker,    breath sounds clear to auscultation       Cardiovascular hypertension, Pt. on medications + Peripheral Vascular Disease and +CHF   Rhythm:Regular Rate:Tachycardia     Neuro/Psych    GI/Hepatic hiatal hernia, GERD  ,  Endo/Other  diabetes, Type 2, Insulin Dependent  Renal/GU ESRF and DialysisRenal disease     Musculoskeletal  (+) Arthritis ,   Abdominal   Peds  Hematology  (+) anemia ,   Anesthesia Other Findings Subdural Hematoma  Reproductive/Obstetrics                             Anesthesia Physical  Anesthesia Plan  ASA: IV and emergent  Anesthesia Plan: General   Post-op Pain Management:    Induction: Intravenous  PONV Risk Score and Plan: 4 or greater and Treatment may vary due to age or medical condition  Airway Management Planned: Oral ETT  Additional Equipment: Arterial line  Intra-op Plan:   Post-operative Plan: Post-operative intubation/ventilation  Informed Consent: I have reviewed the patients History and Physical, chart, labs and discussed the procedure including the risks, benefits and alternatives for the proposed anesthesia with the patient or authorized representative who has indicated his/her understanding and acceptance.     Plan Discussed with:   Anesthesia Plan Comments:         Anesthesia Quick Evaluation

## 2017-08-23 NOTE — Progress Notes (Signed)
Patient ID: [name], female   DOB: 07-23-1951, 67 y.o.   MRN: [id number] Events noted.  Patient fell and had intracranial bleed.  Underwent evacuation of hematoma.  Currently intubated in the neuro intensive care unit.  Right groin healing with some mild fat necrosis in the groin incision.  Right great toe amputation completely healed.  Following with you.

## 2017-08-23 NOTE — Progress Notes (Signed)
08/23/17 0700  What Happened  Was fall witnessed? No  Was patient injured? Yes  Patient found on floor  Found by Staff-comment Salena Saner Abran Duke, RN)  Stated prior activity other (comment) (on the chair, got up to hang clothes up)  Follow Up  MD notified Dr. Asa Lente  Time MD notified [phone]  Simple treatment Ice  Adult Fall Risk Assessment  Risk Factor Category (scoring not indicated) Fall has occurred during this admission (document High fall risk)  Age 67  Patient's Fall Risk High Fall Risk (>13 points)  Adult Fall Risk Interventions  Required Bundle Interventions *See Row Information* High fall risk - low, moderate, and high requirements implemented  Screening for Fall Injury Risk  Required Injury Bundle Interventions *See Row Information* Injury Bundle Implemented Except Low Bed  Screening for Fall Injury Risk Interventions  Specialty Low Bed Contraindicated Centrella Smart Bed  Vitals  BP (!) 186/87  MAP (mmHg) 110  BP Location Right Arm  BP Method Automatic  Patient Position (if appropriate) Lying  Pulse Rate 98  Pulse Rate Source Dinamap  Resp 20  Oxygen Therapy  SpO2 100 %  O2 Device Room Air  Pain Assessment  Pain Assessment 0-10  Pain Score 4  Pain Type Acute pain  Pain Location Head (forehead)  Pain Orientation Right  Pain Descriptors / Indicators Dull  Pain Frequency Constant  Pain Onset On-going  Pain Intervention(s) MD notified (Comment);Cold applied  Neurological  Neuro (WDL) X  Level of Consciousness Alert  Orientation Level Oriented X4  Cognition Follows commands  Speech Clear  Pupil Assessment  Yes  R Pupil Size (mm) 3  R Pupil Shape Round  R Pupil Reaction Brisk  L Pupil Size (mm) 3  L Pupil Shape Round  L Pupil Reaction Brisk  R Hand Grip Moderate  L Hand Grip Moderate   R Foot Dorsiflexion Present;Moderate  L Foot Dorsiflexion Present;Moderate  R Foot Plantar Flexion Present;Moderate  L Foot Plantar Flexion  Present;Moderate  RUE Motor Response Purposeful movement  RUE Sensation Full sensation  RUE Motor Strength 5  LUE Motor Response Purposeful movement  LUE Sensation Full sensation  LUE Motor Strength 5  RLE Motor Response Purposeful movement  RLE Motor Strength 5  LLE Motor Response Purposeful movement  LLE Motor Strength 5  Glasgow Coma Scale  Eye Opening 4  Best Motor Response 6  Best Verbal Response (NON-intubated) 5  Glasgow Coma Scale Score (!) 20  Modified Verbal Response (INTUBATED) 5  Musculoskeletal  Musculoskeletal (WDL) X  Assistive Device Wheelchair  Generalized Weakness Yes  RLE Weight Bearing WBAT  Musculoskeletal Details  RLE Surgery;Amputated toes  LLE Amputated toes  Integumentary  Integumentary (WDL) X  Skin Color Appropriate for ethnicity  Skin Integrity Surgical Incision (see LDA);Other (Comment) (knot on R forehead)  Skin Turgor Non-tenting  Pain Assessment  Date Pain First Started 01/23/18  Result of Injury Yes  Pain Screening  Clinical Progression Gradually improving  Pain Assessment  Work-Related Injury No

## 2017-08-23 NOTE — Progress Notes (Signed)
[facility] Progress Note    Subjective:   Events noted Fall this AM, large SDH with midline shift Had emergency craniotomy Post op labs still pending (Was due for HD today)  Objective Vitals:   08/23/17 1615 08/23/17 1630 08/23/17 1645 08/23/17 1700  BP:    (!) 133/49  Pulse:    81  Resp: (!) 30 (!) 27 (!) 27 (!) 30  SpO2:    100%   Physical Exam Intubated/sedated Bulky dressing from craniotomy today Lungs ant clear S1S2 No S3 Abd soft not tender 1+ LLL edema. Trace RLE edema.  R great toe amp site clean and healed R groin healing.  Dialysis Access: left upper AVGG + bruit  Recent Labs  Lab 08/19/17 0710 08/20/17 1400 08/23/17 1037  NA 133* 128* 125*  K 4.0 4.0 3.9  CL 92* 89*  --   CO2 29 25  --   GLUCOSE 137* 213*  --   BUN 12 26*  --   CREATININE 3.51* 5.88*  --   CALCIUM 9.4 9.4  --   PHOS 1.5* 2.2*  --    Recent Labs  Lab 08/19/17 0710 08/20/17 1400  ALBUMIN 3.0* 2.8*    Recent Labs  Lab 08/19/17 0710 08/20/17 1400 08/23/17 1037  WBC 12.0* 11.1*  --   HGB 10.2* 9.0* 8.8*  HCT 31.8* 28.8* 26.0*  MCV 100.6* 100.7*  --   PLT 374 417*  --     Recent Labs  Lab 08/22/17 1641 08/22/17 2044 08/23/17 0614 08/23/17 1215 08/23/17 1555  GLUCAP 176* 172* 134* 130* 134*   Iron Studies: No results for input(s): IRON, TIBC, TRANSFERRIN, FERRITIN in the last 72 hours. Lab Results  Component Value Date   INR 1.05 08/11/2017   INR 1.03 07/07/2016   INR 1.02 10/06/2015   Studies/Results: Ct Head Wo Contrast  Result Date: 08/23/2017 CLINICAL DATA:  Fall.  Head injury.  Patient is on anticoagulation. EXAM: CT HEAD WITHOUT CONTRAST TECHNIQUE: Contiguous axial images were obtained from the base of the skull through the vertex without intravenous contrast. COMPARISON:  CT head without contrast 03/31/2013. FINDINGS: Brain: A large left subdural hematoma is present. The hematoma measures up to 15 mm on coronal images. There is 10 mm of  left-to-right midline shift at the foramen of Monro. There is significant mass effect with effacement of the left lateral ventricle. No significant subarachnoid hemorrhage is present. No intraventricular hemorrhage is present. Midline shift results in developing uncal herniation. The posterior fossa is otherwise intact. No acute infarct is present.  Basal ganglia are intact. Vascular: Vascular calcifications are present within the cavernous internal carotid arteries bilaterally. There is no hyperdense vessel. Skull: Calvarium is intact. No acute fractures present. No significant extracranial injury is present. Sinuses/Orbits: Mild mucosal thickening is present in the sphenoid sinuses bilaterally. There is some fluid in the right mastoid air cells. Left mastoid air cells are clear. The remaining paranasal sinuses are clear. The globes and orbits are within normal limits. IMPRESSION: 1. Large left subdural hematoma measuring up to 15 mm. 2. Significant mass effect with effacement of the left lateral ventricle in 10 mm of left-to-right midline shift. 3. Developing uncal herniation without other downward herniation in the posterior fossa. 4. Atherosclerosis. Critical Value/emergent results were called by telephone at the time of interpretation on 08/23/2017 at 9:16 am to Reesa Chew, NP, who verbally acknowledged these results. Use Electronically Signed   By: San Morelle M.D.   On: 08/23/2017 09:19  Dg Chest Port 1 View  Addendum Date: 08/23/2017   ADDENDUM REPORT: 08/23/2017 14:12 ADDENDUM: Central venous catheter tip from LEFT IJ approach lies with its tip in the proximal RIGHT atrium, and should be withdrawn approximately 5 cm to lie at the cavoatrial junction. No pneumothorax. Findings discussed with the patient's nurse. Electronically Signed   By: Staci Righter M.D.   On: 08/23/2017 14:12   Result Date: 08/23/2017 CLINICAL DATA:  Subdural hematoma. EXAM: PORTABLE CHEST 1 VIEW COMPARISON:  Portable  film earlier today. FINDINGS: Unchanged cardiomediastinal silhouette. ET tube is still too low, now 7 mm above the carina. The tip is directed toward the RIGHT mainstem bronchus. It should be withdrawn 3-4 cm. LEFT basilar atelectasis with small effusion. Orogastric tube is in the stomach. IMPRESSION: ET tube remains low and should be withdrawn 3-4 cm. Satisfactory position of orogastric tube. Slight worsening aeration. Electronically Signed: By: Staci Righter M.D. On: 08/23/2017 14:01   Dg Chest Portable 1 View  Result Date: 08/23/2017 CLINICAL DATA:  Endotracheal tube placement. EXAM: PORTABLE CHEST 1 VIEW COMPARISON:  Radiographs of February 25, 2017. FINDINGS: Stable cardiomediastinal silhouette. Atherosclerosis of thoracic aorta is noted. Nasogastric tube is seen entering stomach. Endotracheal tube is seen projected over tracheal air shadow with distal tip 2 cm above the carina. No pneumothorax or pleural effusion is noted. No acute pulmonary disease is noted. Bony thorax is unremarkable. IMPRESSION: Endotracheal and nasogastric tubes are in grossly good position. Aortic atherosclerosis. No acute cardiopulmonary abnormality seen. Electronically Signed   By: Marijo Conception, M.D.   On: 08/23/2017 10:28   Dg Abd Portable 1v  Result Date: 08/23/2017 CLINICAL DATA:  Nasogastric tube placement. EXAM: PORTABLE ABDOMEN - 1 VIEW COMPARISON:  07/11/2016. FINDINGS: Nasogastric tube terminates in the stomach. Bowel gas pattern is unremarkable. IMPRESSION: Nasogastric tube terminates in the stomach. Electronically Signed   By: Lorin Picket M.D.   On: 08/23/2017 14:00   Medications: . sodium chloride    . 0.9 % NaCl with KCl 20 mEq / L 80 mL/hr at 08/23/17 1407  . fentaNYL infusion INTRAVENOUS 50 mcg/hr (08/23/17 1700)  . levETIRAcetam Stopped (08/23/17 1546)  . niCARDipine 15 mg/hr (08/23/17 1700)  . propofol (DIPRIVAN) infusion 20 mcg/kg/min (08/23/17 1700)   . chlorhexidine gluconate (MEDLINE KIT)  15  mL Mouth Rinse BID  . chlorhexidine gluconate (MEDLINE KIT)  15 mL Mouth Rinse BID  . ferric citrate  420 mg Oral TID WC  . gabapentin  100 mg Oral QHS  . hydrALAZINE  25 mg Oral 3 times per day on Mon Wed Fri  . [START ON 08/24/2017] hydrALAZINE  25 mg Oral 2 times per day on Sun Tue Thu Sat  . insulin aspart  2-6 Units Subcutaneous Q4H  . mouth rinse  15 mL Mouth Rinse 10 times per day  . mometasone-formoterol  2 puff Inhalation BID  . pantoprazole (PROTONIX) IV  40 mg Intravenous Daily   Dialysis Orders: [facility]WF [facility]   4h    66.5kg   3K/ 2.25    Profile 4    LUA AVG    Hep 2500 ,  1000 midrun Venofer 51m IV qwk - last 1/11 Mircera 346m IV q2wks - last 1/09  Assessment: 1. PVD s/p right fem po bypass with right great toe amp 1/16  2. S/p fall, traumatic SDH with midline shift s/p  post emergency craniotomy 1/28 today  Dr. CaChristella Noa Intubated, sedated at present. On IV nicardipine.  3. ESRD -  usual MWF. Would like to hold until tomorrow given craniotomy today. Labs pending. Hold HD until Tuesday unless K up. No heparin. 4. Anemia - Aranesp 150 was scheduled for today with HD - did not get. Give tomorrow with HD.  5. Secondary hyperparathyroidism - Last labs P 2.2 Ca 9.4 - no binders  6. HTN/volume - net UF 1 L Friday with no post wt - need standing wts pre and post HD but will be awhile before can get given current circumstances. Currently IV nicardipine. 7. Nutrition - alb 2.8  8. DM primary service 9. N/ V - Issue last week. It resolved off of binders. Resume velphoro when taking po again. (don't give phoslo and caused N/V).    Jamal Maes, MD [facility] [phone] Pager 08/23/2017, 5:54 PM           ]

## 2017-08-23 NOTE — Significant Event (Signed)
Rapid Response Event Note  Overview:  Called to assist with patient with neuro changes Time Called: 0919 Arrival Time: 0920 Event Type: Neurologic  Initial Focused Assessment:  Staff reports patient fell unwitnessed this AM at around 0630 - was at neuro baseline with some c/o right forehead pain -  In CT scan patient experienced n/v and became less responsive. On return to rehab dept  RRT called to room to assist.  Patient somulent - moans to DPS and some movement all 4's but nothing to command - Pupils 36mm ERL but sluggish - skin warm and dry - very slight gag noted - resps regular and unlabored - O2 sats 100% on 2 liters nasal cannula - bil BS - clear.  Elevated BP 198/153 - HR 110 regular - resps 24.  No IV access - has grafts left arm - restricted. Algis Liming PA and nursing staff from rehab in room assisting. Stat call to [facility] for ICU bed with report of new large SDH.    Interventions:  HOB elevated.  Stat call to PCCM and neurosurgery by Algis Liming PA - attempt at IV site right arm - vein blew - husband to bedside - placed on monitor -  Updated by Algis Liming PA.  Suctioned oral airway - little gag.  Dr. Nelda Marseille to bedside - stat transfer to 4N ICU - assisted with intubation - stabilization - right IJ catheter per Georgann Housekeeper NP - Cardene drip started - Mannitol push per order Dr. Christella Noa - Dr. Christella Noa to bedside - stat transfer to OR - husband updated and supported.  Handoff to [employer].    Plan of Care (if not transferred):  Event Summary: Name of Physician Notified: Dr. Posey Pronto - Rehab   Reesa Chew PA at (pta RRT)  Name of Consulting Physician Notified: Dr. Nelda Marseille   Dr. Christella Noa at [phone]  Outcome: Transferred (Comment)(4N31)     Quin Hoop

## 2017-08-24 ENCOUNTER — Ambulatory Visit: Payer: Medicare Other | Admitting: Vascular Surgery

## 2017-08-24 ENCOUNTER — Inpatient Hospital Stay (HOSPITAL_COMMUNITY): Payer: Medicare Other

## 2017-08-24 ENCOUNTER — Inpatient Hospital Stay (HOSPITAL_COMMUNITY): Payer: Medicare Other | Admitting: Anesthesiology

## 2017-08-24 ENCOUNTER — Inpatient Hospital Stay (HOSPITAL_COMMUNITY)
Admission: AD | Disposition: A | Discharge status: Rehabilitation Facility | Payer: Self-pay | Source: Intra-hospital | Attending: Neurosurgery

## 2017-08-24 ENCOUNTER — Encounter (HOSPITAL_COMMUNITY): Payer: Self-pay | Admitting: Neurosurgery

## 2017-08-24 DIAGNOSIS — J9601 Acute respiratory failure with hypoxia: Secondary | ICD-10-CM

## 2017-08-24 DIAGNOSIS — S065XAA Traumatic subdural hemorrhage with loss of consciousness status unknown, initial encounter: Secondary | ICD-10-CM | POA: Diagnosis present

## 2017-08-24 DIAGNOSIS — S065X9A Traumatic subdural hemorrhage with loss of consciousness of unspecified duration, initial encounter: Secondary | ICD-10-CM | POA: Diagnosis present

## 2017-08-24 HISTORY — PX: CRANIOTOMY: SHX93

## 2017-08-24 LAB — BLOOD GAS, ARTERIAL
Acid-base deficit: 1.6 mmol/L (ref 0.0–2.0)
Bicarbonate: 20.8 mmol/L (ref 20.0–28.0)
Drawn by: 520751
FIO2: 40
MECHVT: 450 mL
O2 Saturation: 99.1 %
PEEP: 5 cmH2O
Patient temperature: 98.7
RATE: 30 resp/min
pCO2 arterial: 24.5 mmHg — ABNORMAL LOW (ref 32.0–48.0)
pH, Arterial: 7.539 — ABNORMAL HIGH (ref 7.350–7.450)
pO2, Arterial: 134 mmHg — ABNORMAL HIGH (ref 83.0–108.0)

## 2017-08-24 LAB — CBC
HCT: 18.6 % — ABNORMAL LOW (ref 36.0–46.0)
Hemoglobin: 6.1 g/dL — CL (ref 12.0–15.0)
MCH: 33 pg (ref 26.0–34.0)
MCHC: 32.8 g/dL (ref 30.0–36.0)
MCV: 100.5 fL — ABNORMAL HIGH (ref 78.0–100.0)
Platelets: 407 10*3/uL — ABNORMAL HIGH (ref 150–400)
RBC: 1.85 MIL/uL — ABNORMAL LOW (ref 3.87–5.11)
RDW: 17.9 % — ABNORMAL HIGH (ref 11.5–15.5)
WBC: 14.6 10*3/uL — ABNORMAL HIGH (ref 4.0–10.5)

## 2017-08-24 LAB — BPAM PLATELET PHERESIS
Blood Product Expiration Date: 201901291039
Blood Product Expiration Date: 201901292359
ISSUE DATE / TIME: 201901281041
ISSUE DATE / TIME: 201901281041
Unit Type and Rh: 6200
Unit Type and Rh: 6200

## 2017-08-24 LAB — POCT I-STAT 3, ART BLOOD GAS (G3+)
Acid-Base Excess: 3 mmol/L — ABNORMAL HIGH (ref 0.0–2.0)
Bicarbonate: 26.3 mmol/L (ref 20.0–28.0)
Bicarbonate: 27.5 mmol/L (ref 20.0–28.0)
O2 Saturation: 100 %
O2 Saturation: 98 %
Patient temperature: 98
Patient temperature: 98.4
TCO2: 28 mmol/L (ref 22–32)
TCO2: 29 mmol/L (ref 22–32)
pCO2 arterial: 39.5 mmHg (ref 32.0–48.0)
pCO2 arterial: 47.5 mmHg (ref 32.0–48.0)
pH, Arterial: 7.35 (ref 7.350–7.450)
pH, Arterial: 7.45 (ref 7.350–7.450)
pO2, Arterial: 101 mmHg (ref 83.0–108.0)
pO2, Arterial: 431 mmHg — ABNORMAL HIGH (ref 83.0–108.0)

## 2017-08-24 LAB — GLUCOSE, CAPILLARY
Glucose-Capillary: 74 mg/dL (ref 65–99)
Glucose-Capillary: 109 mg/dL — ABNORMAL HIGH (ref 65–99)
Glucose-Capillary: 147 mg/dL — ABNORMAL HIGH (ref 65–99)
Glucose-Capillary: 112 mg/dL — ABNORMAL HIGH (ref 65–99)

## 2017-08-24 LAB — PREPARE RBC (CROSSMATCH)

## 2017-08-24 LAB — RENAL FUNCTION PANEL
Albumin: 2.2 g/dL — ABNORMAL LOW (ref 3.5–5.0)
Anion gap: 11 (ref 5–15)
BUN: 37 mg/dL — ABNORMAL HIGH (ref 6–20)
CO2: 21 mmol/L — ABNORMAL LOW (ref 22–32)
Calcium: 8.5 mg/dL — ABNORMAL LOW (ref 8.9–10.3)
Chloride: 100 mmol/L — ABNORMAL LOW (ref 101–111)
Creatinine, Ser: 7.03 mg/dL — ABNORMAL HIGH (ref 0.44–1.00)
GFR calc Af Amer: 6 mL/min — ABNORMAL LOW (ref >60)
GFR calc non Af Amer: 5 mL/min — ABNORMAL LOW (ref >60)
Glucose, Bld: 172 mg/dL — ABNORMAL HIGH (ref 65–99)
Phosphorus: 4.9 mg/dL — ABNORMAL HIGH (ref 2.5–4.6)
Potassium: 5.4 mmol/L — ABNORMAL HIGH (ref 3.5–5.1)
Sodium: 132 mmol/L — ABNORMAL LOW (ref 135–145)

## 2017-08-24 LAB — PREPARE PLATELET PHERESIS
Unit division: 0
Unit division: 0

## 2017-08-24 LAB — POCT I-STAT 4, (NA,K, GLUC, HGB,HCT)
Glucose, Bld: 120 mg/dL — ABNORMAL HIGH (ref 65–99)
HCT: 25 % — ABNORMAL LOW (ref 36.0–46.0)
Hemoglobin: 8.5 g/dL — ABNORMAL LOW (ref 12.0–15.0)
Potassium: 4 mmol/L (ref 3.5–5.1)
Sodium: 133 mmol/L — ABNORMAL LOW (ref 135–145)

## 2017-08-24 SURGERY — CRANIOTOMY HEMATOMA EVACUATION SUBDURAL
Anesthesia: General | Site: Head | Laterality: Left

## 2017-08-24 MED ORDER — SODIUM CHLORIDE 0.9 % IV SOLN: 100.0000 mL | INTRAVENOUS | Status: DC | PRN

## 2017-08-24 MED ORDER — SODIUM CHLORIDE 0.9 % IV SOLN
100.0000 mL | INTRAVENOUS | Status: DC | PRN
Start: 2017-08-24 — End: 2017-08-24

## 2017-08-24 MED ORDER — LIDOCAINE HCL (PF) 1 % IJ SOLN: 5.0000 mL | INTRAMUSCULAR | Status: DC | PRN

## 2017-08-24 MED ORDER — HEPARIN SODIUM (PORCINE) 1000 UNIT/ML DIALYSIS: 1000.0000 [IU] | INTRAMUSCULAR | Status: DC | PRN

## 2017-08-24 MED ORDER — PENTAFLUOROPROP-TETRAFLUOROETH EX AERO: 1.0000 "application " | INHALATION_SPRAY | CUTANEOUS | Status: DC | PRN

## 2017-08-24 MED ORDER — SODIUM CHLORIDE 0.9 % IV SOLN
INTRAVENOUS | Status: DC | PRN
  Administered 2017-08-24: 19:00:00 via INTRAVENOUS

## 2017-08-24 MED ORDER — PROPOFOL 10 MG/ML IV BOLUS
INTRAVENOUS | Status: AC
  Filled 2017-08-24: qty 40

## 2017-08-24 MED ORDER — CLONIDINE HCL 0.2 MG PO TABS
0.2000 mg | ORAL_TABLET | Freq: Two times a day (BID) | ORAL | Status: DC
  Administered 2017-08-24 – 2017-08-30 (×10): 0.2 mg via ORAL
  Filled 2017-08-24: qty 1
  Filled 2017-08-24 (×2): qty 2
  Filled 2017-08-24 (×3): qty 1
  Filled 2017-08-24: qty 2
  Filled 2017-08-24: qty 1
  Filled 2017-08-24 (×3): qty 2

## 2017-08-24 MED ORDER — ALTEPLASE 2 MG IJ SOLR: 2.0000 mg | Freq: Once | INTRAMUSCULAR | Status: DC | PRN

## 2017-08-24 MED ORDER — FENTANYL CITRATE (PF) 250 MCG/5ML IJ SOLN
INTRAMUSCULAR | Status: DC | PRN
  Administered 2017-08-24 (×2): 50 ug via INTRAVENOUS

## 2017-08-24 MED ORDER — HEPARIN SODIUM (PORCINE) 1000 UNIT/ML DIALYSIS: 20.0000 [IU]/kg | INTRAMUSCULAR | Status: DC | PRN

## 2017-08-24 MED ORDER — PROPOFOL 10 MG/ML IV BOLUS
INTRAVENOUS | Status: DC | PRN
  Administered 2017-08-24: 40 mg via INTRAVENOUS

## 2017-08-24 MED ORDER — BACITRACIN ZINC 500 UNIT/GM EX OINT
TOPICAL_OINTMENT | CUTANEOUS | Status: DC | PRN
  Administered 2017-08-24: 1 via TOPICAL

## 2017-08-24 MED ORDER — LIDOCAINE-PRILOCAINE 2.5-2.5 % EX CREA
1.0000 | TOPICAL_CREAM | CUTANEOUS | Status: DC | PRN
Start: 2017-08-24 — End: 2017-08-24

## 2017-08-24 MED ORDER — 0.9 % SODIUM CHLORIDE (POUR BTL) OPTIME
TOPICAL | Status: DC | PRN
  Administered 2017-08-24 (×2): 1000 mL

## 2017-08-24 MED ORDER — LIDOCAINE HCL (CARDIAC) 20 MG/ML IV SOLN
INTRAVENOUS | Status: DC | PRN
  Administered 2017-08-24: 40 mg via INTRATRACHEAL

## 2017-08-24 MED ORDER — BACITRACIN ZINC 500 UNIT/GM EX OINT
TOPICAL_OINTMENT | CUTANEOUS | Status: AC
  Filled 2017-08-24: qty 28.35

## 2017-08-24 MED ORDER — CEFAZOLIN SODIUM-DEXTROSE 2-3 GM-%(50ML) IV SOLR
INTRAVENOUS | Status: DC | PRN
  Administered 2017-08-24: 2 g via INTRAVENOUS

## 2017-08-24 MED ORDER — LIDOCAINE-EPINEPHRINE 0.5 %-1:200000 IJ SOLN
INTRAMUSCULAR | Status: AC
  Filled 2017-08-24: qty 1

## 2017-08-24 MED ORDER — SODIUM CHLORIDE 0.9 % IV SOLN: Freq: Once | INTRAVENOUS | Status: DC

## 2017-08-24 MED ORDER — CHLORHEXIDINE GLUCONATE 0.12 % MT SOLN
OROMUCOSAL | Status: AC
  Administered 2017-08-24: 15 mL via OROMUCOSAL
  Filled 2017-08-24: qty 15

## 2017-08-24 MED ORDER — PROPOFOL 500 MG/50ML IV EMUL
INTRAVENOUS | Status: DC | PRN
  Administered 2017-08-24: 30 ug/kg/min via INTRAVENOUS

## 2017-08-24 MED ORDER — LIDOCAINE-PRILOCAINE 2.5-2.5 % EX CREA: 1.0000 "application " | TOPICAL_CREAM | CUTANEOUS | Status: DC | PRN

## 2017-08-24 MED ORDER — SODIUM CHLORIDE 0.45 % IV SOLN
INTRAVENOUS | Status: DC
  Administered 2017-08-24 – 2017-08-27 (×2): via INTRAVENOUS

## 2017-08-24 MED ORDER — FENTANYL CITRATE (PF) 250 MCG/5ML IJ SOLN
INTRAMUSCULAR | Status: AC
  Filled 2017-08-24: qty 5

## 2017-08-24 MED ORDER — THROMBIN 20000 UNITS EX SOLR
CUTANEOUS | Status: AC
  Filled 2017-08-24: qty 20000

## 2017-08-24 MED ORDER — SURGIFOAM 100 EX MISC
CUTANEOUS | Status: DC | PRN
  Administered 2017-08-24: 20 mL

## 2017-08-24 MED ORDER — DEXAMETHASONE SODIUM PHOSPHATE 10 MG/ML IJ SOLN
INTRAMUSCULAR | Status: DC | PRN
  Administered 2017-08-24: 10 mg via INTRAVENOUS

## 2017-08-24 MED ORDER — ROCURONIUM BROMIDE 100 MG/10ML IV SOLN
INTRAVENOUS | Status: DC | PRN
  Administered 2017-08-24 (×5): 50 mg via INTRAVENOUS

## 2017-08-24 SURGICAL SUPPLY — 65 items
BENZOIN TINCTURE PRP APPL 2/3 (GAUZE/BANDAGES/DRESSINGS) IMPLANT
BLADE CLIPPER SURG (BLADE) ×3 IMPLANT
BLADE ULTRA TIP 2M (BLADE) ×3 IMPLANT
BNDG GAUZE ELAST 4 BULKY (GAUZE/BANDAGES/DRESSINGS) ×6 IMPLANT
BNDG STRETCH 4X75 NS LF (GAUZE/BANDAGES/DRESSINGS) ×3 IMPLANT
BUR ACORN 6.0 PRECISION (BURR) IMPLANT
BUR ACORN 6.0MM PRECISION (BURR)
BUR MATCHSTICK NEURO 3.0 LAGG (BURR) IMPLANT
BUR SPIRAL ROUTER 2.3 (BUR) IMPLANT
BUR SPIRAL ROUTER 2.3MM (BUR)
CANISTER SUCT 3000ML PPV (MISCELLANEOUS) ×3 IMPLANT
CARTRIDGE OIL MAESTRO DRILL (MISCELLANEOUS) ×1 IMPLANT
CLIP VESOCCLUDE MED 6/CT (CLIP) IMPLANT
DIFFUSER DRILL AIR PNEUMATIC (MISCELLANEOUS) ×3 IMPLANT
DRAPE NEUROLOGICAL W/INCISE (DRAPES) ×3 IMPLANT
DRAPE SURG 17X23 STRL (DRAPES) IMPLANT
DRAPE WARM FLUID 44X44 (DRAPE) ×3 IMPLANT
DURAPREP 6ML APPLICATOR 50/CS (WOUND CARE) ×3 IMPLANT
ELECT REM PT RETURN 9FT ADLT (ELECTROSURGICAL) ×3
ELECTRODE REM PT RTRN 9FT ADLT (ELECTROSURGICAL) ×1 IMPLANT
EVACUATOR 1/8 PVC DRAIN (DRAIN) IMPLANT
EVACUATOR SILICONE 100CC (DRAIN) IMPLANT
GAUZE SPONGE 4X4 12PLY STRL (GAUZE/BANDAGES/DRESSINGS) ×3 IMPLANT
GAUZE SPONGE 4X4 16PLY XRAY LF (GAUZE/BANDAGES/DRESSINGS) IMPLANT
GLOVE BIO SURGEON STRL SZ7 (GLOVE) ×6 IMPLANT
GLOVE BIOGEL PI IND STRL 7.5 (GLOVE) ×1 IMPLANT
GLOVE BIOGEL PI INDICATOR 7.5 (GLOVE) ×2
GLOVE ECLIPSE 6.5 STRL STRAW (GLOVE) ×6 IMPLANT
GLOVE EXAM NITRILE LRG STRL (GLOVE) IMPLANT
GLOVE EXAM NITRILE XL STR (GLOVE) IMPLANT
GLOVE EXAM NITRILE XS STR PU (GLOVE) IMPLANT
GOWN STRL REUS W/ TWL LRG LVL3 (GOWN DISPOSABLE) ×2 IMPLANT
GOWN STRL REUS W/ TWL XL LVL3 (GOWN DISPOSABLE) IMPLANT
GOWN STRL REUS W/TWL 2XL LVL3 (GOWN DISPOSABLE) IMPLANT
GOWN STRL REUS W/TWL LRG LVL3 (GOWN DISPOSABLE) ×4
GOWN STRL REUS W/TWL XL LVL3 (GOWN DISPOSABLE)
GRAFT DURAGEN MATRIX 2WX2L ×9 IMPLANT
HEMOSTAT SURGICEL 2X14 (HEMOSTASIS) IMPLANT
KIT BASIN OR (CUSTOM PROCEDURE TRAY) ×3 IMPLANT
KIT ROOM TURNOVER OR (KITS) ×3 IMPLANT
NEEDLE HYPO 25X1 1.5 SAFETY (NEEDLE) ×3 IMPLANT
NS IRRIG 1000ML POUR BTL (IV SOLUTION) ×9 IMPLANT
OIL CARTRIDGE MAESTRO DRILL (MISCELLANEOUS) ×3
PACK CRANIOTOMY CUSTOM (CUSTOM PROCEDURE TRAY) ×3 IMPLANT
PATTIES SURGICAL .5 X.5 (GAUZE/BANDAGES/DRESSINGS) IMPLANT
PATTIES SURGICAL .5 X3 (DISPOSABLE) IMPLANT
PATTIES SURGICAL 1X1 (DISPOSABLE) IMPLANT
SCREW SELF DRILL HT 1.5/4MM (Screw) ×3 IMPLANT
SPONGE NEURO XRAY DETECT 1X3 (DISPOSABLE) IMPLANT
SPONGE SURGIFOAM ABS GEL 100 (HEMOSTASIS) ×3 IMPLANT
STAPLER VISISTAT 35W (STAPLE) ×3 IMPLANT
SUT ETHILON 3 0 FSL (SUTURE) IMPLANT
SUT ETHILON 3 0 PS 1 (SUTURE) IMPLANT
SUT NURALON 4 0 TR CR/8 (SUTURE) ×6 IMPLANT
SUT STEEL 0 (SUTURE)
SUT STEEL 0 18XMFL TIE 17 (SUTURE) IMPLANT
SUT VIC AB 2-0 CT2 18 VCP726D (SUTURE) ×9 IMPLANT
TAPE CLOTH 1X10 TAN NS (GAUZE/BANDAGES/DRESSINGS) ×3 IMPLANT
TOWEL GREEN STERILE (TOWEL DISPOSABLE) ×3 IMPLANT
TOWEL GREEN STERILE FF (TOWEL DISPOSABLE) ×3 IMPLANT
TRAY FOLEY W/METER SILVER 16FR (SET/KITS/TRAYS/PACK) ×3 IMPLANT
TUBE CONNECTING 12'X1/4 (SUCTIONS) ×1
TUBE CONNECTING 12X1/4 (SUCTIONS) ×2 IMPLANT
UNDERPAD 30X30 (UNDERPADS AND DIAPERS) ×3 IMPLANT
WATER STERILE IRR 1000ML POUR (IV SOLUTION) ×3 IMPLANT

## 2017-08-24 NOTE — Procedures (Signed)
Pt had 2 hours of HD, rec'd 1 unit of blood. L AVF Was felt to be less alert, primary requested D/C of HD for pt to go for CT CT results improvement in SDH and less midline shift Post weight 68.5 EDW 66.5  Will re-evaluate in the AM for additional HD. Of note pre HD Hb had dropped further to 6.1 and plan was for 2 units, only had time for 1.   Jamal Maes, MD [facility] [phone] Pager 08/24/2017, 5:56 PM

## 2017-08-24 NOTE — Progress Notes (Addendum)
  Progress Note    08/24/2017 8:11 AM 1 Day Post-Op  Subjective:  Intubated and sedated s/p emergent craniotomy   Vitals:   08/24/17 0600 08/24/17 0700  BP: (!) 143/57 (!) 141/58  Pulse:    Resp: (!) 30 (!) 30  Temp:    SpO2:     Physical Exam: Lungs:  Mechanical ventilation Incisions:  R groin with minimal breakdown mid incision; R lower leg incision healing well; R GT incision healing well Extremities:  Palpable R DP Abdomen:  Soft Neurologic: sedated  CBC    Component Value Date/Time   WBC 13.8 (H) 08/23/2017 1228   RBC 2.18 (L) 08/23/2017 1228   HGB 7.1 (L) 08/23/2017 1228   HCT 21.6 (L) 08/23/2017 1228   PLT 449 (H) 08/23/2017 1228   MCV 99.1 08/23/2017 1228   MCH 32.6 08/23/2017 1228   MCHC 32.9 08/23/2017 1228   RDW 16.9 (H) 08/23/2017 1228   LYMPHSABS 1.9 08/23/2017 1228   MONOABS 0.5 08/23/2017 1228   EOSABS 0.2 08/23/2017 1228   BASOSABS 0.0 08/23/2017 1228    BMET    Component Value Date/Time   NA 127 (L) 08/23/2017 1228   NA 135 (A) 07/15/2016   K 3.9 08/23/2017 1228   CL 90 (L) 08/23/2017 1228   CO2 22 08/23/2017 1228   GLUCOSE 176 (H) 08/23/2017 1228   BUN 33 (H) 08/23/2017 1228   BUN 30 (A) 07/15/2016   CREATININE 6.49 (H) 08/23/2017 1228   CALCIUM 8.5 (L) 08/23/2017 1228   GFRNONAA 6 (L) 08/23/2017 1228   GFRAA 7 (L) 08/23/2017 1228    INR    Component Value Date/Time   INR 1.02 08/23/2017 1228     Intake/Output Summary (Last 24 hours) at 08/24/2017 0811 Last data filed at 08/24/2017 0700 Gross per 24 hour  Intake 3356.22 ml  Output 700 ml  Net 2656.22 ml     Assessment/Plan:  67 y.o. female is s/p s/p R fem pop bypass with PTFE and R GT amp; Pt sustained a fall while in rehab and underwent emergent craniotomy 1 Day Post-Op   CT head demonstrating SDH with mass effect and 67mm shift Dry dressing place in fold of R groin; to be changed daily R GT amp healing well  Anticoagulation discontinued We will monitor patency of R  fem-pop intermittently   Dagoberto Ligas, PA-C [facility] [phone] 08/24/2017 8:11 AM

## 2017-08-24 NOTE — Procedures (Signed)
Extubation Procedure Note  Patient Details:   Name: Destiny Day DOB: 1951/03/18 MRN: [id number]   Airway Documentation:     Evaluation  O2 sats: stable throughout Complications: No apparent complications Patient did tolerate procedure well. Bilateral Breath Sounds: Clear, Diminished   Yes, Pt extubated, placed on 4 lpm [place] with no stridor, breath sounds equal and bilateral.  Destiny Day 08/24/2017, 1:01 PM

## 2017-08-24 NOTE — Op Note (Signed)
08/24/2017  9:34 PM  PATIENT:  Destiny Day  67 y.o. female had a recurrent subdural hematoma which caused a decline in her mental status over the course of the day. I recommended that we go back to the OR to evacuate the clot.  PRE-OPERATIVE DIAGNOSIS:  recurrent acute subdural hematoma, left  POST-OPERATIVE DIAGNOSIS:  recurrent acute subdural hematoma, left  PROCEDURE:  Procedure(s):Left CRANIOTOMY FOR RECURRENT ACUTE SUBDURAL HEMATOMA  SURGEON: Surgeon(s): Ashok Pall, MD  ASSISTANTS:none  ANESTHESIA:   general  EBL:  Total I/O In: 8466 [I.V.:800; Blood:637] Out: 115 [Urine:15; Blood:100]  BLOOD ADMINISTERED:none  CELL SAVER GIVEN:none  COUNT:per nursing  DRAINS: none   SPECIMEN:  No Specimen  DICTATION: BARBARITA HUTMACHER was taken to the operating room, intubated, and placed under a general anesthetic without difficulty. She was positioned on a horseshoe with all pressure points properly padded. I removed the staples after I prepped and draped her head in a sterile manner. I used scissors to open the wound. I reflected the scalp flap rostrally and held it with fishooks.I removed the screws from the skull side and removed the craniotomy and set it aside.  I removed some epidural clots, but this was no more than the expected one day post op blood. I opened the dura with scissors and reflected it rostrally also. I removed some substantial clots in the subdural space frontally and overlying the parietal occipital lobes. I irrigated copiously and felt that the pressure was removed from the brain. I then approximated the dura. I saw no point in a drain as the brain was in close apposition to the dura and skull. I approximated the dura with sutures, I also placed artifical dura along the surface of the brain where the dura was unable to reach. I approximated the skull flap with plates and screws. I loosely approximated the temporalis muscle. I closed the scalp with galeal  sutures, and approximated the scalp edges with staples. I applied a sterile dressing. We left the patient intubated and she was brought to the ICU directly.    PLAN OF CARE: Admit to inpatient   PATIENT DISPOSITION:  ICU - intubated and hemodynamically stable.   Delay start of Pharmacological VTE agent (>24hrs) due to surgical blood loss or risk of bleeding:  yes

## 2017-08-24 NOTE — Progress Notes (Signed)
Patient discharged to acute from CIR on 08/23/2017. Was to be discharged 08/24/2017 at Mod I level with family. Noted pt extubated today and I met with Nurse, spouse, son and daughter at bedside post extubation. Please order PT and OT consults when appropriate. I will follow her progress. [phone]

## 2017-08-24 NOTE — Progress Notes (Signed)
eLink Physician-Brief Progress Note Patient Name: Destiny Day DOB: 1950-08-18 MRN: [id number]   Date of Service  08/24/2017  HPI/Events of Note  Returns from OR intubated and ventilated. Already has Propofol IV infusion orders for sedation.   eICU Interventions  Will order: 1. Mechanical Ventilation - 100$/PRVC 15/TV 450/P 5. 2. ABG at 10:30 PM. 3. Portable CXR STAT.      Intervention Category Major Interventions: Respiratory failure - evaluation and management  Lysle Dingwall 08/24/2017, 9:36 PM

## 2017-08-24 NOTE — Consult Note (Signed)
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  Patient is unresponsive and no history is available.  Patient is on LMW heparin prophylactic dose and plavix.  Patient was in the hospital for a toe amputation and a vascular procedure.   SUBJECTIVE:  Intubated , full vent support, sedation on hold, Following commands, T Max 99.3, + 2600 cc  VITAL SIGNS: BP (!) 154/58   Pulse 96   Temp 98.7 F (37.1 C) (Axillary)   Resp (!) 30   Wt 156 lb 8.4 oz (71 kg)   SpO2 100%   BMI 26.05 kg/m   HEMODYNAMICS:    VENTILATOR SETTINGS: Vent Mode: PRVC FiO2 (%):  [40 %] 40 % Set Rate:  [16 bmp-30 bmp] 30 bmp Vt Set:  [450 mL] 450 mL PEEP:  [5 cmH20] 5 cmH20 Plateau Pressure:  [18 cmH20-22 cmH20] 20 cmH20  INTAKE / OUTPUT: I/O last 3 completed shifts: In: 3356.2 [I.V.:2881.2; Blood:270; IV PTWSFKCLE:751] Out: 700 [Urine:50; Emesis/NG output:150; Blood:500]  PHYSICAL EXAMINATION: General:  Chronically ill appearing female, intubated and unresponsive, sedation off Neuro:  Following commands , moving an ext to command L>R, sedation off HEENT:  [place]/AT, pupils sluggish and no EOM and + gag, facial edema, left eye swelling, bulky dressing to head Cardiovascular:  RRR, Nl S1/S2, -M/R/G. Lungs:  CTA bilaterally, bilateral chest excursion noted, non-assisting Abdomen:  Soft, NT, ND and +BS Musculoskeletal:  -edema and -tenderness Skin:  Intact, warm and dry, no lesions or rash noted  LABS:  BMET Recent Labs  Lab 08/19/17 0710 08/20/17 1400 08/23/17 1037 08/23/17 1228  NA 133* 128* 125* 127*  K 4.0 4.0 3.9 3.9  CL 92* 89*  --  90*  CO2 29  25  --  22  BUN 12 26*  --  33*  CREATININE 3.51* 5.88*  --  6.49*  GLUCOSE 137* 213*  --  176*    Electrolytes Recent Labs  Lab 08/19/17 0710 08/20/17 1400 08/23/17 1228  CALCIUM 9.4 9.4 8.5*  PHOS 1.5* 2.2* 3.1    CBC Recent Labs  Lab 08/19/17 0710 08/20/17 1400 08/23/17 1037 08/23/17 1228  WBC 12.0* 11.1*  --  13.8*  HGB 10.2* 9.0* 8.8* 7.1*  HCT 31.8* 28.8* 26.0* 21.6*  PLT 374 417*  --  449*    Coag's Recent Labs  Lab 08/23/17 1228  INR 1.02    Sepsis Markers No results for input(s): LATICACIDVEN, PROCALCITON, O2SATVEN in the last 168 hours.  ABG Recent Labs  Lab 08/23/17 1037 08/23/17 1215  PHART 7.482* 7.582*  PCO2ART 40.1 29.3*  PO2ART 451.0* 410.0*    Liver Enzymes Recent Labs  Lab 08/19/17 0710 08/20/17 1400 08/23/17 1228  ALBUMIN 3.0* 2.8* 2.4*    Cardiac Enzymes No results for input(s): TROPONINI, PROBNP in the last 168 hours.  Glucose Recent Labs  Lab 08/23/17 1215 08/23/17 1555 08/23/17 1959 08/23/17 2340 08/24/17 0336 08/24/17 0826  GLUCAP 130* 134* 121* 106* 74 109*    Imaging Ct Head Wo Contrast  Result Date: 08/23/2017 CLINICAL DATA:  Fall.  Head injury.  Patient is on anticoagulation. EXAM: CT HEAD WITHOUT  CONTRAST TECHNIQUE: Contiguous axial images were obtained from the base of the skull through the vertex without intravenous contrast. COMPARISON:  CT head without contrast 03/31/2013. FINDINGS: Brain: A large left subdural hematoma is present. The hematoma measures up to 15 mm on coronal images. There is 10 mm of left-to-right midline shift at the foramen of Monro. There is significant mass effect with effacement of the left lateral ventricle. No significant subarachnoid hemorrhage is present. No intraventricular hemorrhage is present. Midline shift results in developing uncal herniation. The posterior fossa is otherwise intact. No acute infarct is present.  Basal ganglia are intact. Vascular: Vascular calcifications are  present within the cavernous internal carotid arteries bilaterally. There is no hyperdense vessel. Skull: Calvarium is intact. No acute fractures present. No significant extracranial injury is present. Sinuses/Orbits: Mild mucosal thickening is present in the sphenoid sinuses bilaterally. There is some fluid in the right mastoid air cells. Left mastoid air cells are clear. The remaining paranasal sinuses are clear. The globes and orbits are within normal limits. IMPRESSION: 1. Large left subdural hematoma measuring up to 15 mm. 2. Significant mass effect with effacement of the left lateral ventricle in 10 mm of left-to-right midline shift. 3. Developing uncal herniation without other downward herniation in the posterior fossa. 4. Atherosclerosis. Critical Value/emergent results were called by telephone at the time of interpretation on 08/23/2017 at 9:16 am to Reesa Chew, NP, who verbally acknowledged these results. Use Electronically Signed   By: San Morelle M.D.   On: 08/23/2017 09:19   Dg Chest Port 1 View  Addendum Date: 08/23/2017   ADDENDUM REPORT: 08/23/2017 14:12 ADDENDUM: Central venous catheter tip from LEFT IJ approach lies with its tip in the proximal RIGHT atrium, and should be withdrawn approximately 5 cm to lie at the cavoatrial junction. No pneumothorax. Findings discussed with the patient's nurse. Electronically Signed   By: Staci Righter M.D.   On: 08/23/2017 14:12   Result Date: 08/23/2017 CLINICAL DATA:  Subdural hematoma. EXAM: PORTABLE CHEST 1 VIEW COMPARISON:  Portable film earlier today. FINDINGS: Unchanged cardiomediastinal silhouette. ET tube is still too low, now 7 mm above the carina. The tip is directed toward the RIGHT mainstem bronchus. It should be withdrawn 3-4 cm. LEFT basilar atelectasis with small effusion. Orogastric tube is in the stomach. IMPRESSION: ET tube remains low and should be withdrawn 3-4 cm. Satisfactory position of orogastric tube. Slight worsening  aeration. Electronically Signed: By: Staci Righter M.D. On: 08/23/2017 14:01   Dg Chest Portable 1 View  Result Date: 08/23/2017 CLINICAL DATA:  Endotracheal tube placement. EXAM: PORTABLE CHEST 1 VIEW COMPARISON:  Radiographs of February 25, 2017. FINDINGS: Stable cardiomediastinal silhouette. Atherosclerosis of thoracic aorta is noted. Nasogastric tube is seen entering stomach. Endotracheal tube is seen projected over tracheal air shadow with distal tip 2 cm above the carina. No pneumothorax or pleural effusion is noted. No acute pulmonary disease is noted. Bony thorax is unremarkable. IMPRESSION: Endotracheal and nasogastric tubes are in grossly good position. Aortic atherosclerosis. No acute cardiopulmonary abnormality seen. Electronically Signed   By: Marijo Conception, M.D.   On: 08/23/2017 10:28   Dg Abd Portable 1v  Result Date: 08/23/2017 CLINICAL DATA:  Nasogastric tube placement. EXAM: PORTABLE ABDOMEN - 1 VIEW COMPARISON:  07/11/2016. FINDINGS: Nasogastric tube terminates in the stomach. Bowel gas pattern is unremarkable. IMPRESSION: Nasogastric tube terminates in the stomach. Electronically Signed   By: Lorin Picket M.D.   On: 08/23/2017 14:00     STUDIES:  CT of the head 1/28 with large SDH  CULTURES: None  ANTIBIOTICS: None  SIGNIFICANT EVENTS: 08/11/2017>> Right Fem Pop by pass with right great toe amputation 1/28 fall and SDH to the OR 08/23/18`9>> Craniotomy, Hematoma evacuation Subdural( mass effect with 10 mm shift)  LINES/TUBES: ETT 1/28>>> L IJ CVC>> 1/28 R radial  A aline>> 1/28  DISCUSSION: 67 year old female with ESRD-HD history presenting to PCCM in respiratory failure after a fall and SDH with mass effect and 10 mm shift.  ASSESSMENT / PLAN:  PULMONARY A: VDRF due to inability to protect her airway LEFT basilar atelectasis with small effusion 1/28.  Following commands 1/29 P:  - VAP precautions  - Titrate FiO2 and PEEP to maintain sats of > 94 - SBT,  and wean on CPAP as able - ABG now ( 1/29) - CXR 1/30 am  CARDIOVASCULAR A:  HTN post head injury P:  - Tele - Cardene drip - Consider re-starting home meds if BP remains elevated after HD 1/29 - Target SBP of 150 or per neurosurg - BMET daily - Replace electrolytes as indicated - Check Mag and Phos - EKG prn  RENAL A:   ESRD usual HD MWF Hyponatremia P:   - HD per renal - For HD 08/24/2017 - Trend BMET and Urine output - Minimize free water - KVO all IVF     GASTROINTESTINAL A:   Albumin 2.8 P:   - NPO - Dietitian consult for TF - Protonix as SUP - Trend albumin - SLP once extubated  HEMATOLOGIC A:   Leukocytosis Anemia>> HGB drop 8.8>>7.1 post op No obvious bleeding P:  - CBC in AM - Transfuse per ICU protocol/ ? If tranfuse 1 U PRBC with HD today - Aranesp per Renal - Monitor for active bleeding  INFECTIOUS A:   Leukocytosis T Max 99.3 P:   - Monitor WBC and fever curve - Culture as is clinically indicated - Follow micro - Add ABX as appropriate  - Consider d/c A line ( Not accurate)  ENDOCRINE A:   DM   P:   - ISS - CBG's Q 4  NEUROLOGIC A:   Acute SDH Following commands, MAE x 4 L>R P:   RASS goal: 0 - Neuro checks per unit routine - PRN fentanyl - F/u post OR visit   FAMILY  - Updates:  No family at bedside 1/29  - Inter-disciplinary family meet or Palliative Care meeting due by:  day [place], AGACNP-BC. [place]. Pager [phone].  08/24/2017, 8:51 AM

## 2017-08-24 NOTE — Transfer of Care (Signed)
Immediate Anesthesia Transfer of Care Note  Patient: Destiny Day  Procedure(s) Performed: CRANIOTOMY FOR RECURRENT ACUTE SUBDURAL HEMATOMA (Left Head)  Patient Location: ICU  Anesthesia Type:General  Level of Consciousness: Patient remains intubated per anesthesia plan  Airway & Oxygen Therapy: Patient remains intubated per anesthesia plan and Patient placed on Ventilator (see vital sign flow sheet for setting)  Post-op Assessment: Report given to RN and Post -op Vital signs reviewed and stable  Post vital signs: Reviewed and stable  Last Vitals:  Vitals:   08/24/17 1745 08/24/17 1800  BP: (!) 154/50 (!) 150/51  Pulse: 88 87  Resp: 15 15  Temp: 36.6 C 36.9 C  SpO2: 93% 92%    Last Pain:  Vitals:   08/24/17 1800  TempSrc: Axillary         Complications: No apparent anesthesia complications

## 2017-08-24 NOTE — Progress Notes (Signed)
[facility] Progress Note    Subjective:   Fall, large SDH with midline shift, emergency craniotomy 1/28 Remains on vent but awake, follows commands Held HD yesterday Getting IVF's with K (?)   Objective Vitals:   08/24/17 0800 08/24/17 0900 08/24/17 0935 08/24/17 1000  BP: (!) 154/58 (!) 170/55 (!) 173/57 (!) 159/55  Pulse:  97 97 97  Resp: (!) 30 (!) 30 (!) 30 (!) 30  Temp: 98.7 F (37.1 C)     TempSrc: Axillary     SpO2: 100% 100% 100% 100%  Weight:       Physical Exam Intubated but awake and following commands Daughter in room with her Bulky dressing from craniotomy  Left eyelid swollen shut Lungs ant clear S1S2 No S3 Abd soft not tender 1+ LLL edema. Trace RLE edema.  R great toe amp site clean and healed R groin healing. Leg incisions intact Dialysis Access: left upper AVGG + bruit  Recent Labs  Lab 08/19/17 0710 08/20/17 1400 08/23/17 1037 08/23/17 1228  NA 133* 128* 125* 127*  K 4.0 4.0 3.9 3.9  CL 92* 89*  --  90*  CO2 29 25  --  22  GLUCOSE 137* 213*  --  176*  BUN 12 26*  --  33*  CREATININE 3.51* 5.88*  --  6.49*  CALCIUM 9.4 9.4  --  8.5*  PHOS 1.5* 2.2*  --  3.1   Recent Labs  Lab 08/19/17 0710 08/20/17 1400 08/23/17 1228  ALBUMIN 3.0* 2.8* 2.4*    Recent Labs  Lab 08/19/17 0710 08/20/17 1400 08/23/17 1037 08/23/17 1228  WBC 12.0* 11.1*  --  13.8*  NEUTROABS  --   --   --  11.2*  HGB 10.2* 9.0* 8.8* 7.1*  HCT 31.8* 28.8* 26.0* 21.6*  MCV 100.6* 100.7*  --  99.1  PLT 374 417*  --  449*    Recent Labs  Lab 08/23/17 1555 08/23/17 1959 08/23/17 2340 08/24/17 0336 08/24/17 0826  GLUCAP 134* 121* 106* 74 109*    Lab Results  Component Value Date   INR 1.02 08/23/2017   INR 1.05 08/11/2017   INR 1.03 07/07/2016   Studies/Results: Ct Head Wo Contrast  Result Date: 08/23/2017 CLINICAL DATA:  Fall.  Head injury.  Patient is on anticoagulation. EXAM: CT HEAD WITHOUT CONTRAST TECHNIQUE: Contiguous axial  images were obtained from the base of the skull through the vertex without intravenous contrast. COMPARISON:  CT head without contrast 03/31/2013. FINDINGS: Brain: A large left subdural hematoma is present. The hematoma measures up to 15 mm on coronal images. There is 10 mm of left-to-right midline shift at the foramen of Monro. There is significant mass effect with effacement of the left lateral ventricle. No significant subarachnoid hemorrhage is present. No intraventricular hemorrhage is present. Midline shift results in developing uncal herniation. The posterior fossa is otherwise intact. No acute infarct is present.  Basal ganglia are intact. Vascular: Vascular calcifications are present within the cavernous internal carotid arteries bilaterally. There is no hyperdense vessel. Skull: Calvarium is intact. No acute fractures present. No significant extracranial injury is present. Sinuses/Orbits: Mild mucosal thickening is present in the sphenoid sinuses bilaterally. There is some fluid in the right mastoid air cells. Left mastoid air cells are clear. The remaining paranasal sinuses are clear. The globes and orbits are within normal limits. IMPRESSION: 1. Large left subdural hematoma measuring up to 15 mm. 2. Significant mass effect with effacement of the left lateral ventricle in  10 mm of left-to-right midline shift. 3. Developing uncal herniation without other downward herniation in the posterior fossa. 4. Atherosclerosis. Critical Value/emergent results were called by telephone at the time of interpretation on 08/23/2017 at 9:16 am to Reesa Chew, NP, who verbally acknowledged these results. Use Electronically Signed   By: San Morelle M.D.   On: 08/23/2017 09:19   Dg Chest Port 1 View  Addendum Date: 08/23/2017   ADDENDUM REPORT: 08/23/2017 14:12 ADDENDUM: Central venous catheter tip from LEFT IJ approach lies with its tip in the proximal RIGHT atrium, and should be withdrawn approximately 5 cm to  lie at the cavoatrial junction. No pneumothorax. Findings discussed with the patient's nurse. Electronically Signed   By: Staci Righter M.D.   On: 08/23/2017 14:12   Result Date: 08/23/2017 CLINICAL DATA:  Subdural hematoma. EXAM: PORTABLE CHEST 1 VIEW COMPARISON:  Portable film earlier today. FINDINGS: Unchanged cardiomediastinal silhouette. ET tube is still too low, now 7 mm above the carina. The tip is directed toward the RIGHT mainstem bronchus. It should be withdrawn 3-4 cm. LEFT basilar atelectasis with small effusion. Orogastric tube is in the stomach. IMPRESSION: ET tube remains low and should be withdrawn 3-4 cm. Satisfactory position of orogastric tube. Slight worsening aeration. Electronically Signed: By: Staci Righter M.D. On: 08/23/2017 14:01   Dg Chest Portable 1 View  Result Date: 08/23/2017 CLINICAL DATA:  Endotracheal tube placement. EXAM: PORTABLE CHEST 1 VIEW COMPARISON:  Radiographs of February 25, 2017. FINDINGS: Stable cardiomediastinal silhouette. Atherosclerosis of thoracic aorta is noted. Nasogastric tube is seen entering stomach. Endotracheal tube is seen projected over tracheal air shadow with distal tip 2 cm above the carina. No pneumothorax or pleural effusion is noted. No acute pulmonary disease is noted. Bony thorax is unremarkable. IMPRESSION: Endotracheal and nasogastric tubes are in grossly good position. Aortic atherosclerosis. No acute cardiopulmonary abnormality seen. Electronically Signed   By: Marijo Conception, M.D.   On: 08/23/2017 10:28   Dg Abd Portable 1v  Result Date: 08/23/2017 CLINICAL DATA:  Nasogastric tube placement. EXAM: PORTABLE ABDOMEN - 1 VIEW COMPARISON:  07/11/2016. FINDINGS: Nasogastric tube terminates in the stomach. Bowel gas pattern is unremarkable. IMPRESSION: Nasogastric tube terminates in the stomach. Electronically Signed   By: Lorin Picket M.D.   On: 08/23/2017 14:00   Medications: . sodium chloride    . 0.9 % NaCl with KCl 20 mEq / L 80  mL/hr at 08/24/17 0700  . fentaNYL infusion INTRAVENOUS Stopped (08/24/17 0800)  . levETIRAcetam Stopped (08/24/17 0300)  . niCARDipine 10 mg/hr (08/24/17 0914)  . propofol (DIPRIVAN) infusion Stopped (08/24/17 0800)   . chlorhexidine gluconate (MEDLINE KIT)  15 mL Mouth Rinse BID  . Chlorhexidine Gluconate Cloth  6 each Topical Daily  . cloNIDine  0.2 mg Oral BID  . ferric citrate  420 mg Oral TID WC  . gabapentin  100 mg Oral QHS  . hydrALAZINE  25 mg Oral 3 times per day on Mon Wed Fri  . hydrALAZINE  25 mg Oral 2 times per day on Sun Tue Thu Sat  . insulin aspart  2-6 Units Subcutaneous Q4H  . mouth rinse  15 mL Mouth Rinse 10 times per day  . mometasone-formoterol  2 puff Inhalation BID  . pantoprazole (PROTONIX) IV  40 mg Intravenous Daily  . sodium chloride flush  10-40 mL Intracatheter Q12H   Dialysis Orders: [facility]   4h    66.5kg   3K/ 2.25    Profile 4  LUA AVG    Hep 2500 ,  1000 midrun Venofer 41m IV qwk - last 1/11 Mircera 359m IV q2wks - last 1/09  Assessment: 1. PVD s/p right fem po bypass with right great toe amp 1/16  2. S/p fall, traumatic SDH with midline shift s/p  post emergency craniotomy 1/28 by  Dr. CaChristella Noa Intubated but awake and following commands.  3. ESRD - usual MWF. HD today off schedule. Stop IVF's.  4. Anemia - Aranesp 150 with HD today. Transfuse 1 unit with HD.  5. Secondary hyperparathyroidism - Last labs P 2.2 Ca 9.4 - no binders  6. Hypertension On IV nicardipine. Fluid intake with that 75 cc/hour so needs to get back on BP meds so that can be reduced. CCM will resume BP meds post HD today. Need to wean off nicardipine d/t large obligatory fluid intake of 75 cc/hour.  7. Nutrition - alb 2.8  8. DM primary service 9. N/ V - Issue last week. It resolved off of binders. Resume velphoro when taking po again. (don't give phoslo and caused N/V).    CyJamal MaesMD [facility] [phone] 08/24/2017, 10:23  AM           ]

## 2017-08-24 NOTE — Anesthesia Procedure Notes (Signed)
Procedure Name: Intubation Date/Time: 08/24/2017 7:20 PM Performed by: Clovis Cao, CRNA Pre-anesthesia Checklist: Patient identified, Emergency Drugs available, Suction available, Patient being monitored and Timeout performed Patient Re-evaluated:Patient Re-evaluated prior to induction Oxygen Delivery Method: Circle system utilized Preoxygenation: Pre-oxygenation with 100% oxygen Induction Type: IV induction Ventilation: Mask ventilation without difficulty Laryngoscope Size: Glidescope (Glidescope after esophageal intubation with Sabra Heck 2) Grade View: Grade II Tube type: Subglottic suction tube Tube size: 7.5 mm Number of attempts: 3 (Pt's airway edematous.  First attempt with Sabra Heck 2 and esophageal intubation.  DL with Gliescope. Grade 2 view but unable to pass ETT. Mask ventilation resumed.  DL with Glidescope and blue bougie.  Successful placement of ETT.) Airway Equipment and Method: Video-laryngoscopy and Stylet Placement Confirmation: ETT inserted through vocal cords under direct vision,  positive ETCO2 and breath sounds checked- equal and bilateral Secured at: 21 cm Tube secured with: Tape Dental Injury: Teeth and Oropharynx as per pre-operative assessment

## 2017-08-24 NOTE — Progress Notes (Signed)
Initial Nutrition Assessment  DOCUMENTATION CODES:   Not applicable  INTERVENTION:    PO diet advancement vs TF initiation  RD to follow for nutrition care plan  NUTRITION DIAGNOSIS:   Inadequate oral intake related to inability to eat as evidenced by NPO status  GOAL:   Patient will meet greater than or equal to 90% of their needs  MONITOR:   Vent status, TF tolerance, Labs, Skin, Weight trends, I & O's  REASON FOR ASSESSMENT:   Consult Enteral/tube feeding initiation and management  ASSESSMENT:   66 yo Female with ESRD-HD who fell on 1/28 AM and suffered a SDH with acute AMS and respiratory failure for inability to protect her airway.   Pt extubated today, 1223.  S/p emergent subdural evacuation, crainotomy 1/28. Was on IP Rehab floor (4W/22M) prior to fall incident. PO intake was good at 75-100% per flowsheets.  Noted pt with ESRD on HD - MWF schedule. Labs and medications reviewed. Na 127 (L). CBG's [id number].  NUTRITION - FOCUSED PHYSICAL EXAM:  Unable to complete at this time. Pt appeared well nourished.  Diet Order:  Diet NPO time specified  EDUCATION NEEDS:   No education needs have been identified at this time  Skin:  Skin Assessment: Skin Integrity Issues: Skin Integrity Issues:: Incisions Incisions: head; dressing in place  Last BM:  1/29   Intake/Output Summary (Last 24 hours) at 08/24/2017 1310 Last data filed at 08/24/2017 1200 Gross per 24 hour  Intake 3578.52 ml  Output 200 ml  Net 3378.52 ml   Height:   Ht Readings from Last 1 Encounters:  08/16/17 5\' 5"  (1.651 m)   Weight:   Wt Readings from Last 1 Encounters:  08/24/17 156 lb 8.4 oz (71 kg)   Ideal Body Weight:  56.8 kg  BMI:  Body mass index is 26.05 kg/m.  Estimated Nutritional Needs:   Kcal:  2000-2200  Protein:  105-120 gm  Fluid:  per MD  Arthur Holms, RD, LDN Pager #: [phone] After-Hours Pager #: [phone]

## 2017-08-24 NOTE — Progress Notes (Signed)
Patient ID: Destiny Day, female   DOB: 1951/03/07, 67 y.o.   MRN: [id number] BP (!) 154/58   Pulse 96   Temp 98.7 F (37.1 C) (Axillary)   Resp (!) 30   Wt 71 kg (156 lb 8.4 oz)   SpO2 100%   BMI 26.05 kg/m  Following commands, moving all extremities Perrl,  Will be dialyzed today Dressing intact. Remains intubated, though doing well

## 2017-08-24 NOTE — Anesthesia Preprocedure Evaluation (Signed)
Anesthesia Evaluation  Patient identified by MRN, date of birth, ID band Patient confused    Reviewed: Allergy & Precautions, NPO status , Patient's Chart, lab work & pertinent test resultsPreop documentation limited or incomplete due to emergent nature of procedure.  History of Anesthesia Complications (+) history of anesthetic complications  Airway Mallampati: II  TM Distance: >3 FB Neck ROM: Full    Dental  (+) Dental Advisory Given, Missing   Pulmonary shortness of breath and with exertion, asthma , sleep apnea , former smoker,    + rhonchi        Cardiovascular hypertension, Pt. on medications + Peripheral Vascular Disease and +CHF  Normal cardiovascular exam Rhythm:Regular Rate:Normal  Echo 10/09/15: Study Conclusions  - Left ventricle: The cavity size was normal. There was severe concentric hypertrophy. Systolic function was normal. The estimated ejection fraction was in the range of 55% to 60%. Wall motion was normal; there were no regional wall motion abnormalities. Features are consistent with a pseudonormal left ventricular filling pattern, with concomitant abnormal relaxation and increased filling pressure (grade 2 diastolic dysfunction).   Doppler parameters are consistent with high ventricular filling pressure. - Aortic valve: Mildly calcified annulus. Trileaflet; normal   thickness, mildly calcified leaflets. Transvalvular velocity was within the normal range. There was no stenosis. There was no regurgitation. - Mitral valve: Calcified annulus. Transvalvular velocity was   within the normal range. There was no evidence for stenosis.   There was no regurgitation. - Left atrium: The atrium was moderately dilated. - Right ventricle: The cavity size was normal. Wall thickness was normal. Systolic function was normal. - Atrial septum: No defect or patent foramen ovale was identified. - Inferior vena cava: The vessel was  normal in size. The   respirophasic diameter changes were in the normal range (>= 50%), consistent with normal central venous pressure.   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     Musculoskeletal  (+) Arthritis ,   Abdominal   Peds  Hematology  (+) Blood dyscrasia, anemia , Currently receiving 2nd pRBC for Hgb of 6.1   Anesthesia Other Findings Day of surgery medications reviewed with the patient.  Reproductive/Obstetrics                            Anesthesia Physical Anesthesia Plan  ASA: V and emergent  Anesthesia Plan: General   Post-op Pain Management:    Induction: Intravenous  PONV Risk Score and Plan: 3 and Midazolam, Dexamethasone and Ondansetron  Airway Management Planned: Oral ETT  Additional Equipment: Arterial line  Intra-op Plan:   Post-operative Plan: Post-operative intubation/ventilation  Informed Consent: I have reviewed the patients History and Physical, chart, labs and discussed the procedure including the risks, benefits and alternatives for the proposed anesthesia with the patient or authorized representative who has indicated his/her understanding and acceptance.   Dental advisory given  Plan Discussed with: CRNA  Anesthesia Plan Comments: (Discussed R/B with family. Arterial line removed today, will attempt replacement in OR.)        Anesthesia Quick Evaluation

## 2017-08-25 ENCOUNTER — Encounter (HOSPITAL_COMMUNITY): Payer: Self-pay | Admitting: Neurosurgery

## 2017-08-25 ENCOUNTER — Inpatient Hospital Stay (HOSPITAL_COMMUNITY): Payer: Medicare Other

## 2017-08-25 LAB — GLUCOSE, CAPILLARY
Glucose-Capillary: 111 mg/dL — ABNORMAL HIGH (ref 65–99)
Glucose-Capillary: 119 mg/dL — ABNORMAL HIGH (ref 65–99)
Glucose-Capillary: 139 mg/dL — ABNORMAL HIGH (ref 65–99)
Glucose-Capillary: 142 mg/dL — ABNORMAL HIGH (ref 65–99)
Glucose-Capillary: 155 mg/dL — ABNORMAL HIGH (ref 65–99)
Glucose-Capillary: 158 mg/dL — ABNORMAL HIGH (ref 65–99)
Glucose-Capillary: 169 mg/dL — ABNORMAL HIGH (ref 65–99)

## 2017-08-25 LAB — RENAL FUNCTION PANEL
Albumin: 2.3 g/dL — ABNORMAL LOW (ref 3.5–5.0)
Anion gap: 14 (ref 5–15)
BUN: 23 mg/dL — ABNORMAL HIGH (ref 6–20)
CO2: 22 mmol/L (ref 22–32)
Calcium: 8.3 mg/dL — ABNORMAL LOW (ref 8.9–10.3)
Chloride: 96 mmol/L — ABNORMAL LOW (ref 101–111)
Creatinine, Ser: 4.49 mg/dL — ABNORMAL HIGH (ref 0.44–1.00)
GFR calc Af Amer: 11 mL/min — ABNORMAL LOW (ref >60)
GFR calc non Af Amer: 9 mL/min — ABNORMAL LOW (ref >60)
Glucose, Bld: 158 mg/dL — ABNORMAL HIGH (ref 65–99)
Phosphorus: 5.3 mg/dL — ABNORMAL HIGH (ref 2.5–4.6)
Potassium: 4.2 mmol/L (ref 3.5–5.1)
Sodium: 132 mmol/L — ABNORMAL LOW (ref 135–145)

## 2017-08-25 LAB — CBC
HCT: 25.6 % — ABNORMAL LOW (ref 36.0–46.0)
Hemoglobin: 8.3 g/dL — ABNORMAL LOW (ref 12.0–15.0)
MCH: 30.6 pg (ref 26.0–34.0)
MCHC: 32.4 g/dL (ref 30.0–36.0)
MCV: 94.5 fL (ref 78.0–100.0)
Platelets: 457 10*3/uL — ABNORMAL HIGH (ref 150–400)
RBC: 2.71 MIL/uL — ABNORMAL LOW (ref 3.87–5.11)
RDW: 17.8 % — ABNORMAL HIGH (ref 11.5–15.5)
WBC: 21 10*3/uL — ABNORMAL HIGH (ref 4.0–10.5)

## 2017-08-25 LAB — MAGNESIUM: Magnesium: 1.9 mg/dL (ref 1.7–2.4)

## 2017-08-25 LAB — PREPARE PLATELET PHERESIS
Unit division: 0
Unit division: 0

## 2017-08-25 LAB — BPAM PLATELET PHERESIS
Blood Product Expiration Date: 201901302007
Blood Product Expiration Date: 201901312359
ISSUE DATE / TIME: 201901292008
ISSUE DATE / TIME: 201901292008
Unit Type and Rh: 6200
Unit Type and Rh: 6200

## 2017-08-25 LAB — URINE CULTURE: Culture: NO GROWTH

## 2017-08-25 MED FILL — Thrombin For Soln 20000 Unit: CUTANEOUS | Qty: 1 | Status: AC

## 2017-08-25 NOTE — Progress Notes (Signed)
Destiny Fuller, NP notified of chest xray results- OG curled upward in lower esophagus. Ordered to pull back and re xray.

## 2017-08-25 NOTE — Progress Notes (Signed)
Patient ID: Destiny Day, female   DOB: 23-Apr-1951, 67 y.o.   MRN: [id number] BP 140/68   Pulse 83   Temp 98.4 F (36.9 C) (Axillary)   Resp 18   Ht 5\' 5"  (1.651 m)   Wt 68.5 kg (151 lb 0.2 oz)   SpO2 100%   BMI 25.13 kg/m  Following commands, moving all extremities Perrl,  +cough, +gag Doing well today

## 2017-08-25 NOTE — Progress Notes (Signed)
Nutrition Follow-up  INTERVENTION:   - If pt is extubated and unable to tolerate nutrition orally per speech evaluation, RD recommends initiating Jevity 1.2 @ 60 mL/day (1440 mL/day) and Pro-stat 30 mL TID.  Recommended tube feeding regimen provides 2028 kcal, 125 grams of protein, and 1166 mL of H2O.  See assessment for TF recommendations if pt fails extubation.  NUTRITION DIAGNOSIS:   Inadequate oral intake related to inability to eat as evidenced by NPO status.  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   Patient is currently intubated on ventilator support MV: 6.4 L/min Temp (24hrs), Avg:97.4 F (36.3 C), Min:96 F (35.6 C), Max:98.5 F (36.9 C)  Propofol not infusing at time of visit.  Per conversation with RN, pt likely extubated tomorrow morning after HD. Pt was seen yesterday by another RD who estimated needs of pt when extubated: 2000-2200 kcal/day and 105-120 grams protein/day.  Per discussion with lead clinical RD, concern for pt's risk of aspiration after extubation. RD will leave TF recommendations if pt unable to consume nutrition orally. TF recommendations listed under intervention are appropriate only if pt is extubated.  If pt fails extubation tomorrow and remains intubated, recommend initiating Pivot 1.5 @ 30 mL/hr (720 mL/day) and Pro-stat 30 mL TID which provides 1380 kcal/day (104% estimated energy needs), 113 grams protein/day (100% estimated protein needs), and 547 mL free water.  I/O's: +4.5 L/day  Medications reviewed and include: ferric citrate, Novolog, Protonix  Labs  reviewed: sodium 132 (L), BUN 23 (H), creatinine 4.49 (H), calcium 8.3 (L), phosphorus 5.3 (H) CBG's: 111, 139, 155  NUTRITION - FOCUSED PHYSICAL EXAM:    Most Recent Value  Orbital Region  No depletion  Upper Arm Region  No depletion  Thoracic and Lumbar Region  No depletion  Buccal Region  Unable to assess  Temple Region  Unable to assess  Clavicle Bone Region  No depletion  Clavicle and Acromion Bone Region  No depletion  Scapular Bone Region  Unable to assess  Dorsal Hand  No depletion  Patellar Region  Mild depletion  Anterior Thigh Region  Mild depletion  Posterior Calf Region  Mild depletion  Edema (RD Assessment)  None  Hair  Unable to assess  Eyes  Unable to assess  Mouth  Unable to assess  Skin  Reviewed  Nails  Reviewed       Diet Order:  Diet NPO time specified  EDUCATION NEEDS:   No education needs have been identified at this time  Skin:  Skin Assessment: Skin Integrity Issues: Skin Integrity Issues:: Incisions Incisions: head; dressing in place  Last BM:  08/24/17 large type 7  Height:   Ht Readings from Last 1 Encounters:  08/24/17 5\' 5"  (1.651 m)    Weight:   Wt Readings from Last 1 Encounters:  08/24/17 151 lb 0.2 oz (68.5 kg)    Ideal Body Weight:  56.8 kg  BMI:  Body mass index is 25.13 kg/m.  Estimated Nutritional Needs:   Kcal:  1330 kcal/day  Protein:  105-120 grams/day  Fluid:  per MD  Kate Jablonski Nabil Bubolz, MS, RD, LDN Pager: [phone] Weekend/After Hours: [phone]  

## 2017-08-25 NOTE — Care Management Note (Signed)
Case Management Note  Patient Details  Name: JOYCELINE MAIORINO MRN: [id number] Date of Birth: Oct 30, 1950  Subjective/Objective:   Pt admitted on 08/23/17 from [facility] after fall with SDH with AMS and respiratory failure.  She required craniotomy with evacuation on 1/28 and 1/29.  PTA, pt resided at home with spouse.                     Action/Plan: Pt currently remains intubated.  Will likely need continued rehab prior to returning home.  Will need PT/OT when able to tolerate therapies post extubation.    Expected Discharge Date:                  Expected Discharge Plan:  [place]  In-House Referral:     Discharge planning Services  CM Consult  Post Acute Care Choice:    Choice offered to:     DME Arranged:    DME Agency:     HH Arranged:    [place] Agency:     Status of Service:  In process, will continue to follow  If discussed at Long Length of Stay Meetings, dates discussed:    Additional Comments:  Reinaldo Raddle, RN, BSN  Trauma/Neuro ICU Case Manager [phone]

## 2017-08-25 NOTE — Anesthesia Postprocedure Evaluation (Signed)
Anesthesia Post Note  Patient: Destiny Day  Procedure(s) Performed: CRANIOTOMY FOR RECURRENT ACUTE SUBDURAL HEMATOMA (Left Head)     Patient location during evaluation: SICU Anesthesia Type: General Level of consciousness: sedated Pain management: pain level controlled Vital Signs Assessment: post-procedure vital signs reviewed and stable Respiratory status: patient remains intubated per anesthesia plan Cardiovascular status: stable Postop Assessment: no apparent nausea or vomiting Anesthetic complications: no    Last Vitals:  Vitals:   08/25/17 0600 08/25/17 0700  BP: (!) 142/58 (!) 141/58  Pulse: 73 72  Resp: 15 15  Temp:    SpO2: 100% 100%    Last Pain:  Vitals:   08/25/17 0400  TempSrc: Axillary                 Catalina Gravel

## 2017-08-25 NOTE — Progress Notes (Signed)
[facility]  Subjective  - Intubated and sedated.  Starting to wean off sedation.   Objective (!) 141/58 72 (!) 97.4 F (36.3 C) (Axillary) 15 100%  Intake/Output Summary (Last 24 hours) at 08/25/2017 0828 Last data filed at 08/25/2017 0700 Gross per 24 hour  Intake 3613.95 ml  Output 2127 ml  Net 1486.95 ml    B feet floating on pillows to protect feet from pressure Right GT incision amputation site healing well. Palpable right DP, B feet warm to touch well perfused Right groin soft, healing well dry guaze placed in groin crease  Lungs ventilator support Heart RRR   Assessment/Planning: S/P R fem pop bypass with PTFE and R GT amp; Pt sustained a fall while in rehab and underwent emergent craniotomy 2 Day Post-Op   By pass patent with palpable DP pulse and incisional healing of GT amputation site. Pending weaning off sedation and neurologic exam s/p SDH with mass effect and 62mm shift after fall.     Roxy Horseman 08/25/2017 8:28 AM --  Laboratory Lab Results: Recent Labs    08/24/17 1400 08/24/17 2022 08/25/17 0412  WBC 14.6*  --  21.0*  HGB 6.1* 8.5* 8.3*  HCT 18.6* 25.0* 25.6*  PLT 407*  --  457*   BMET Recent Labs    08/24/17 1400 08/24/17 2022 08/25/17 0412  NA 132* 133* 132*  K 5.4* 4.0 4.2  CL 100*  --  96*  CO2 21*  --  22  GLUCOSE 172* 120* 158*  BUN 37*  --  23*  CREATININE 7.03*  --  4.49*  CALCIUM 8.5*  --  8.3*    COAG Lab Results  Component Value Date   INR 1.02 08/23/2017   INR 1.05 08/11/2017   INR 1.03 07/07/2016   No results found for: PTT

## 2017-08-25 NOTE — Progress Notes (Signed)
PULMONARY / CRITICAL CARE MEDICINE   Name: LABREA ECCLESTON MRN: [id number] DOB: Sep 03, 1950    ADMISSION DATE:  08/23/2017 CONSULTATION DATE:  08/23/2017  REFERRING MD:  Ernst Spell MD, Posey Pronto  CHIEF COMPLAINT:  AMS and SDH  HISTORY OF PRESENT ILLNESS:   67 year old female with ESRD-HD who fell on 1/28 AM and suffered a SDH with acute AMS and respiratory failure for inability to protect her airway.  Patient is unresponsive and no history is available.  Patient is on LMW heparin prophylactic dose and plavix.  Patient was in the hospital for a toe amputation and a vascular procedure.   SUBJECTIVE:  Was extubated yesterday am but then back to OR overnight for recurrence of SDH. Remained intubated post op.  Had partial HD yesterday - stopped early r/t lethargy.    VITAL SIGNS: BP (!) 141/58   Pulse 72   Temp 98 F (36.7 C) (Axillary)   Resp 15   Ht 5\' 5"  (1.651 m)   Wt 68.5 kg (151 lb 0.2 oz)   SpO2 100%   BMI 25.13 kg/m   HEMODYNAMICS:    VENTILATOR SETTINGS: Vent Mode: PRVC FiO2 (%):  [40 %] 40 % Set Rate:  [15 bmp-30 bmp] 15 bmp Vt Set:  [450 mL] 450 mL PEEP:  [5 cmH20] 5 cmH20 Pressure Support:  [5 cmH20] 5 cmH20 Plateau Pressure:  [8 cmH20-20 cmH20] 19 cmH20  INTAKE / OUTPUT: I/O last 3 completed shifts: In: 5851.4 [I.V.:3887.4; Blood:1659; IV BOFBPZWCH:852] Out: 2327 [Urine:90; Emesis/NG output:300; DPOEU:2353; Blood:100]  PHYSICAL EXAMINATION: General:  Chronically ill appearing female, sleepy, off sedation, NAD  Neuro:  Following some commands, still sleepy, cannot open eyes r/t swelling  HEENT:  [place]/AT, pupils sluggish and no EOM and + gag, facial edema, left eye swelling, bulky dressing to head Cardiovascular:  RRR, Nl S1/S2, -M/R/G. Lungs:  resps even non labored on vent, poor Vt on PS 5/5, better with 8/5 Abdomen:  Soft, NT, ND and +BS Musculoskeletal: some generalized edema  Skin:  Intact, warm and dry, no lesions or rash noted  LABS:  BMET Recent Labs   Lab 08/23/17 1228 08/24/17 1400 08/24/17 2022 08/25/17 0412  NA 127* 132* 133* 132*  K 3.9 5.4* 4.0 4.2  CL 90* 100*  --  96*  CO2 22 21*  --  22  BUN 33* 37*  --  23*  CREATININE 6.49* 7.03*  --  4.49*  GLUCOSE 176* 172* 120* 158*    Electrolytes Recent Labs  Lab 08/23/17 1228 08/24/17 1400 08/25/17 0412  CALCIUM 8.5* 8.5* 8.3*  MG  --   --  1.9  PHOS 3.1 4.9* 5.3*    CBC Recent Labs  Lab 08/23/17 1228 08/24/17 1400 08/24/17 2022 08/25/17 0412  WBC 13.8* 14.6*  --  21.0*  HGB 7.1* 6.1* 8.5* 8.3*  HCT 21.6* 18.6* 25.0* 25.6*  PLT 449* 407*  --  457*    Coag's Recent Labs  Lab 08/23/17 1228  INR 1.02    Sepsis Markers No results for input(s): LATICACIDVEN, PROCALCITON, O2SATVEN in the last 168 hours.  ABG Recent Labs  Lab 08/24/17 0940 08/24/17 1600 08/24/17 2353  PHART 7.539* 7.450 7.350  PCO2ART 24.5* 39.5 47.5  PO2ART 134* 101.0 431.0*    Liver Enzymes Recent Labs  Lab 08/23/17 1228 08/24/17 1400 08/25/17 0412  ALBUMIN 2.4* 2.2* 2.3*    Cardiac Enzymes No results for input(s): TROPONINI, PROBNP in the last 168 hours.  Glucose Recent Labs  Lab 08/24/17  1027 08/24/17 1214 08/24/17 1641 08/25/17 0004 08/25/17 0354 08/25/17 0820  GLUCAP 109* 147* 112* 158* 169* 155*    Imaging Ct Head Wo Contrast  Result Date: 08/24/2017 CLINICAL DATA:  Follow-up examination status post craniotomy for subdural evacuation. EXAM: CT HEAD WITHOUT CONTRAST TECHNIQUE: Contiguous axial images were obtained from the base of the skull through the vertex without intravenous contrast. COMPARISON:  Prior CT from 08/23/2017. FINDINGS: Brain: Postoperative changes from interval left frontal craniotomy for subdural evacuation are seen. Superimposed left frontal burr hole noted. Postoperative pneumocephalus overlies the extra-axial space anteriorly. Left frontotemporal mixed density left subdural hematoma is decreased in size now measuring up to 11 mm at the  level the left frontal convexity, previously 15 mm). Left-to-right midline shift is improved now measuring 6 mm, previously 10 mm. No hydrocephalus or ventricular trapping. Basilar cisterns remain patent. No other intracranial hemorrhage. No acute large vessel territory infarct. No mass lesion. Vascular: No hyperdense vessel. Skull: Postoperative soft tissue swelling present at the left scalp. Skin staples in place. Scattered soft tissue emphysema. Craniotomy bone flap well positioned. Sinuses/Orbits: Globes and orbital soft tissues within normal limits. Scattered mucosal thickening within the sphenoid and maxillary sinuses. Paranasal sinuses are otherwise clear. Right mastoid effusion noted. Other: None. IMPRESSION: 1. Postoperative changes from interval left frontal craniotomy for subdural evacuation. Left subdural collection decreased in size now measuring up to 11 mm in thickness with improved 6 mm of left-to-right shift. 2. No other new acute intracranial abnormality. Electronically Signed   By: Jeannine Boga M.D.   On: 08/24/2017 16:41   Dg Chest Port 1 View  Result Date: 08/24/2017 CLINICAL DATA:  Assess endotracheal tube and line placements. EXAM: PORTABLE CHEST 1 VIEW COMPARISON:  08/23/2017 FINDINGS: Endotracheal tube tip has been retracted. It now projects 2.7 cm above the Carina, well positioned. Left internal jugular central venous line tip lies in the lower superior vena cava above the caval atrial junction. Nasal/orogastric tube curls within the stomach. The tip projects back into the distal esophagus. Cardiac silhouette is normal in size. No mediastinal or hilar masses. There is linear opacity at the left lung base consistent with atelectasis. Lungs are otherwise clear. No convincing pleural effusion.  No pneumothorax. IMPRESSION: 1. Endotracheal tube is now well positioned, tip projecting 2.7 cm above the Carina. 2. Nasal/orogastric tube extends into the stomach, but curled on itself to  have its tip projecting back into the distal esophagus. This is a change from the prior study. 3. Well-positioned left internal jugular central venous line. Electronically Signed   By: Lajean Manes M.D.   On: 08/24/2017 22:02     STUDIES:  CT head 1/28>>> large SDH  CULTURES: None  ANTIBIOTICS: None  SIGNIFICANT EVENTS: 08/11/2017>> Right Fem Pop by pass with right great toe amputation 1/28 fall and SDH to the OR 08/23/18`9>> Craniotomy, Hematoma evacuation Subdural (mass effect with 10 mm shift)  LINES/TUBES: ETT 1/28>>>1/29, 1/29>>> L IJ CVC 1/28>>> R radial  A aline>> 1/28  DISCUSSION: 67 year old female with ESRD-HD history presenting to [facility] in respiratory failure after a fall and SDH with mass effect and 10 mm shift.  ASSESSMENT / PLAN:  PULMONARY A: VDRF due to inability to protect airway in setting large SDH  LEFT basilar atelectasis with small effusion 1/28.  Hx OSA  Hx asthma  P:  Vent support - 8cc/kg  F/u CXR  F/u ABG VAP prevention  dulera home med  Volume removal with HD per renal - may  be able to extubate post HD   CARDIOVASCULAR A:  HTN  PVD - s/p R fem pop bypass with great toe amputation 1/16 P:  Continue cardene gtt - wean as able for goal SBP 150 Tele  Volume removal with HD per renal  KVO IVF  Continue clonidine, hydralazine  PRN labetalol    RENAL A:   ESRD usual HD MWF Hyponatremia P:   F/u chem  HD per renal  KVO IVF  Replete electrolytes PRN  Had partial HD 1/29 Repeat HD 1/30    GASTROINTESTINAL A:   No active issue  P:   NPO for now  Add TF if not extubated today  Speech post extubation  PPI   HEMATOLOGIC A:   Anemia - no obvious bleeding P:  F/u CBC  aranesp per renal  Monitor for bleeding  1 unit PRBC 1/29  INFECTIOUS A:   Leukocytosis T Max 99.3 P:   Monitor wbc, fever curve off abx  Follow cultures    ENDOCRINE A:   DM   P:   SSI    NEUROLOGIC A:   Acute SDH - recurrence  1/29 Following commands, MAE x 4 L>R P:   RASS goal: 0 Per nsgy  Continue propofol - wean as able  Daily WUA  ?further OR needs  PRN fentanyl    FAMILY  - Updates:  Daughter updated at bedside 1/30  - Inter-disciplinary family meet or Palliative Care meeting due by:  day [place], NP 08/25/2017  9:17 AM Pager: [phone] or [phone]

## 2017-08-25 NOTE — Progress Notes (Signed)
[facility] Progress Note    Subjective:   Only 2 hours HD yesterday, transfused 1 unit blood Decr MS had to return to OR for recurrent SDH Intubated this AM, responsive Nods yes to query re SOB Not sure what neurosurgery plan is for today  Objective Vitals:   08/25/17 0500 08/25/17 0600 08/25/17 0700 08/25/17 0800  BP: (!) 141/58 (!) 142/58 (!) 141/58   Pulse: 71 73 72   Resp: _0 Temp:    98 F (36.7 C)  TempSrc:    Axillary  SpO2: 100% 100% 100%   Weight:      Height:       Physical Exam Intubated but responds appropropriately Daughter in room with her Bulky dressing from craniotomy  Facial, UE edema Lungs ant clear S1S2 No S3 Abd soft not tender 1+ LLL edema. Trace RLE edema.  R great toe amp site clean and healed R groin healing. Leg incisions intact Dialysis Access: left upper AVGG + bruit  Recent Labs  Lab 08/23/17 1228 08/24/17 1400 08/24/17 2022 08/25/17 0412  NA 127* 132* 133* 132*  K 3.9 5.4* 4.0 4.2  CL 90* 100*  --  96*  CO2 22 21*  --  22  GLUCOSE 176* 172* 120* 158*  BUN 33* 37*  --  23*  CREATININE 6.49* 7.03*  --  4.49*  CALCIUM 8.5* 8.5*  --  8.3*  PHOS 3.1 4.9*  --  5.3*   Recent Labs  Lab 08/23/17 1228 08/24/17 1400 08/25/17 0412  ALBUMIN 2.4* 2.2* 2.3*    Recent Labs  Lab 08/19/17 0710 08/20/17 1400  08/23/17 1228 08/24/17 1400 08/24/17 2022 08/25/17 0412  WBC 12.0* 11.1*  --  13.8* 14.6*  --  21.0*  NEUTROABS  --   --   --  11.2*  --   --   --   HGB 10.2* 9.0*   < > 7.1* 6.1* 8.5* 8.3*  HCT 31.8* 28.8*   < > 21.6* 18.6* 25.0* 25.6*  MCV 100.6* 100.7*  --  99.1 100.5*  --  94.5  PLT 374 417*  --  449* 407*  --  457*   < > = values in this interval not displayed.    Recent Labs  Lab 08/24/17 1214 08/24/17 1641 08/25/17 0004 08/25/17 0354 08/25/17 0820  GLUCAP 147* 112* 158* 169* 155*    Lab Results  Component Value Date   INR 1.02 08/23/2017   INR 1.05 08/11/2017   INR 1.03  07/07/2016   Studies/Results: Ct Head Wo Contrast  Result Date: 08/24/2017 CLINICAL DATA:  Follow-up examination status post craniotomy for subdural evacuation. EXAM: CT HEAD WITHOUT CONTRAST TECHNIQUE: Contiguous axial images were obtained from the base of the skull through the vertex without intravenous contrast. COMPARISON:  Prior CT from 08/23/2017. FINDINGS: Brain: Postoperative changes from interval left frontal craniotomy for subdural evacuation are seen. Superimposed left frontal burr hole noted. Postoperative pneumocephalus overlies the extra-axial space anteriorly. Left frontotemporal mixed density left subdural hematoma is decreased in size now measuring up to 11 mm at the level the left frontal convexity, previously 15 mm). Left-to-right midline shift is improved now measuring 6 mm, previously 10 mm. No hydrocephalus or ventricular trapping. Basilar cisterns remain patent. No other intracranial hemorrhage. No acute large vessel territory infarct. No mass lesion. Vascular: No hyperdense vessel. Skull: Postoperative soft tissue swelling present at the left scalp. Skin staples in place. Scattered soft tissue emphysema. Craniotomy bone flap well positioned.  Sinuses/Orbits: Globes and orbital soft tissues within normal limits. Scattered mucosal thickening within the sphenoid and maxillary sinuses. Paranasal sinuses are otherwise clear. Right mastoid effusion noted. Other: None. IMPRESSION: 1. Postoperative changes from interval left frontal craniotomy for subdural evacuation. Left subdural collection decreased in size now measuring up to 11 mm in thickness with improved 6 mm of left-to-right shift. 2. No other new acute intracranial abnormality. Electronically Signed   By: Jeannine Boga M.D.   On: 08/24/2017 16:41   Dg Chest Port 1 View  Result Date: 08/24/2017 CLINICAL DATA:  Assess endotracheal tube and line placements. EXAM: PORTABLE CHEST 1 VIEW COMPARISON:  08/23/2017 FINDINGS:  Endotracheal tube tip has been retracted. It now projects 2.7 cm above the Carina, well positioned. Left internal jugular central venous line tip lies in the lower superior vena cava above the caval atrial junction. Nasal/orogastric tube curls within the stomach. The tip projects back into the distal esophagus. Cardiac silhouette is normal in size. No mediastinal or hilar masses. There is linear opacity at the left lung base consistent with atelectasis. Lungs are otherwise clear. No convincing pleural effusion.  No pneumothorax. IMPRESSION: 1. Endotracheal tube is now well positioned, tip projecting 2.7 cm above the Carina. 2. Nasal/orogastric tube extends into the stomach, but curled on itself to have its tip projecting back into the distal esophagus. This is a change from the prior study. 3. Well-positioned left internal jugular central venous line. Electronically Signed   By: Lajean Manes M.D.   On: 08/24/2017 22:02   Dg Chest Port 1 View  Addendum Date: 08/23/2017   ADDENDUM REPORT: 08/23/2017 14:12 ADDENDUM: Central venous catheter tip from LEFT IJ approach lies with its tip in the proximal RIGHT atrium, and should be withdrawn approximately 5 cm to lie at the cavoatrial junction. No pneumothorax. Findings discussed with the patient's nurse. Electronically Signed   By: Staci Righter M.D.   On: 08/23/2017 14:12   Result Date: 08/23/2017 CLINICAL DATA:  Subdural hematoma. EXAM: PORTABLE CHEST 1 VIEW COMPARISON:  Portable film earlier today. FINDINGS: Unchanged cardiomediastinal silhouette. ET tube is still too low, now 7 mm above the carina. The tip is directed toward the RIGHT mainstem bronchus. It should be withdrawn 3-4 cm. LEFT basilar atelectasis with small effusion. Orogastric tube is in the stomach. IMPRESSION: ET tube remains low and should be withdrawn 3-4 cm. Satisfactory position of orogastric tube. Slight worsening aeration. Electronically Signed: By: Staci Righter M.D. On: 08/23/2017 14:01    Dg Chest Portable 1 View  Result Date: 08/23/2017 CLINICAL DATA:  Endotracheal tube placement. EXAM: PORTABLE CHEST 1 VIEW COMPARISON:  Radiographs of February 25, 2017. FINDINGS: Stable cardiomediastinal silhouette. Atherosclerosis of thoracic aorta is noted. Nasogastric tube is seen entering stomach. Endotracheal tube is seen projected over tracheal air shadow with distal tip 2 cm above the carina. No pneumothorax or pleural effusion is noted. No acute pulmonary disease is noted. Bony thorax is unremarkable. IMPRESSION: Endotracheal and nasogastric tubes are in grossly good position. Aortic atherosclerosis. No acute cardiopulmonary abnormality seen. Electronically Signed   By: Marijo Conception, M.D.   On: 08/23/2017 10:28   Dg Abd Portable 1v  Result Date: 08/23/2017 CLINICAL DATA:  Nasogastric tube placement. EXAM: PORTABLE ABDOMEN - 1 VIEW COMPARISON:  07/11/2016. FINDINGS: Nasogastric tube terminates in the stomach. Bowel gas pattern is unremarkable. IMPRESSION: Nasogastric tube terminates in the stomach. Electronically Signed   By: Lorin Picket M.D.   On: 08/23/2017 14:00   Medications: . sodium  chloride 10 mL/hr at 08/25/17 0700  . sodium chloride    . fentaNYL infusion INTRAVENOUS Stopped (08/24/17 0800)  . levETIRAcetam Stopped (08/25/17 0115)  . niCARDipine Stopped (08/24/17 1530)  . propofol (DIPRIVAN) infusion Stopped (08/25/17 0720)   . chlorhexidine gluconate (MEDLINE KIT)  15 mL Mouth Rinse BID  . cloNIDine  0.2 mg Oral BID  . ferric citrate  420 mg Oral TID WC  . gabapentin  100 mg Oral QHS  . hydrALAZINE  25 mg Oral 2 times per day on Sun Tue Thu Sat  . insulin aspart  2-6 Units Subcutaneous Q4H  . mouth rinse  15 mL Mouth Rinse 10 times per day  . mometasone-formoterol  2 puff Inhalation BID  . pantoprazole (PROTONIX) IV  40 mg Intravenous Daily   Dialysis Orders: [facility]   4h    66.5kg   3K/ 2.25    Profile 4    LUA AVG    Hep 2500 ,  1000 midrun Venofer  55m IV qwk - last 1/11 Mircera 329m IV q2wks - last 1/09  Assessment: 1. PVD s/p right fem po bypass with right great toe amp 1/16  2. S/p fall, traumatic SDH with midline shift s/p  post emergency craniotomy 1/28 by  Dr. CaChristella Noareturn to or for recurrent SDH on 1/29. No drain left in, Not sure of neurosurg plan  Intubated but awake and following commands.  3. ESRD - usual MWF. Had only 2 hours HD yesterday. K looks fine. No weight today. At end of 2 hours was 68.5 post HD yesterday.EDW 66.5. 40% FIO2 on vent, coarse BS, requiring more PS and indicates by nod she is SOB. Feel we should HD today for volume 4. Anemia - Transfused 1 unit with HD. No aranesp given. Give today  5. Secondary hyperparathyroidism - no binders NPO 6. Hypertension On IV nicardipine double strength . 7. Nutrition - alb 2.8  8. DM primary service 9. N/ V - Issue last week. It resolved off of binders. Resume velphoro when taking po again. (don't give phoslo and caused N/V).    CyJamal MaesMD [facility] [phone] 08/25/2017, 9:15 AM           ]

## 2017-08-26 ENCOUNTER — Inpatient Hospital Stay (HOSPITAL_COMMUNITY): Payer: Medicare Other

## 2017-08-26 DIAGNOSIS — N186 End stage renal disease: Secondary | ICD-10-CM | POA: Diagnosis not present

## 2017-08-26 DIAGNOSIS — I159 Secondary hypertension, unspecified: Secondary | ICD-10-CM | POA: Diagnosis not present

## 2017-08-26 DIAGNOSIS — Z992 Dependence on renal dialysis: Secondary | ICD-10-CM | POA: Diagnosis not present

## 2017-08-26 LAB — GLUCOSE, CAPILLARY
Glucose-Capillary: 101 mg/dL — ABNORMAL HIGH (ref 65–99)
Glucose-Capillary: 114 mg/dL — ABNORMAL HIGH (ref 65–99)
Glucose-Capillary: 114 mg/dL — ABNORMAL HIGH (ref 65–99)
Glucose-Capillary: 120 mg/dL — ABNORMAL HIGH (ref 65–99)
Glucose-Capillary: 94 mg/dL (ref 65–99)
Glucose-Capillary: 99 mg/dL (ref 65–99)

## 2017-08-26 LAB — RENAL FUNCTION PANEL
Albumin: 2.1 g/dL — ABNORMAL LOW (ref 3.5–5.0)
Anion gap: 16 — ABNORMAL HIGH (ref 5–15)
BUN: 40 mg/dL — ABNORMAL HIGH (ref 6–20)
CO2: 20 mmol/L — ABNORMAL LOW (ref 22–32)
Calcium: 8.4 mg/dL — ABNORMAL LOW (ref 8.9–10.3)
Chloride: 97 mmol/L — ABNORMAL LOW (ref 101–111)
Creatinine, Ser: 5.7 mg/dL — ABNORMAL HIGH (ref 0.44–1.00)
GFR calc Af Amer: 8 mL/min — ABNORMAL LOW (ref >60)
GFR calc non Af Amer: 7 mL/min — ABNORMAL LOW (ref >60)
Glucose, Bld: 130 mg/dL — ABNORMAL HIGH (ref 65–99)
Phosphorus: 6.1 mg/dL — ABNORMAL HIGH (ref 2.5–4.6)
Potassium: 4.8 mmol/L (ref 3.5–5.1)
Sodium: 133 mmol/L — ABNORMAL LOW (ref 135–145)

## 2017-08-26 LAB — CBC
HCT: 26.4 % — ABNORMAL LOW (ref 36.0–46.0)
Hemoglobin: 8.6 g/dL — ABNORMAL LOW (ref 12.0–15.0)
MCH: 31.2 pg (ref 26.0–34.0)
MCHC: 32.6 g/dL (ref 30.0–36.0)
MCV: 95.7 fL (ref 78.0–100.0)
Platelets: 497 10*3/uL — ABNORMAL HIGH (ref 150–400)
RBC: 2.76 MIL/uL — ABNORMAL LOW (ref 3.87–5.11)
RDW: 17.7 % — ABNORMAL HIGH (ref 11.5–15.5)
WBC: 24.8 10*3/uL — ABNORMAL HIGH (ref 4.0–10.5)

## 2017-08-26 MED ORDER — DARBEPOETIN ALFA 150 MCG/0.3ML IJ SOSY
150.0000 ug | PREFILLED_SYRINGE | INTRAMUSCULAR | Status: DC
  Administered 2017-08-26: 150 ug via INTRAVENOUS
  Filled 2017-08-26 (×3): qty 0.3

## 2017-08-26 MED ORDER — CHLORHEXIDINE GLUCONATE CLOTH 2 % EX PADS
6.0000 | MEDICATED_PAD | Freq: Every day | CUTANEOUS | Status: DC
  Administered 2017-08-26 – 2017-08-29 (×3): 6 via TOPICAL

## 2017-08-26 MED ORDER — ORAL CARE MOUTH RINSE
15.0000 mL | Freq: Two times a day (BID) | OROMUCOSAL | Status: DC
  Administered 2017-08-26 – 2017-08-29 (×6): 15 mL via OROMUCOSAL

## 2017-08-26 NOTE — Progress Notes (Signed)
PT Cancellation Note  Patient Details Name: Destiny Day MRN: [id number] DOB: February 28, 1951   Cancelled Treatment:    Reason Eval/Treat Not Completed: Patient not medically ready; spoke with RN who reports pt likely more appropriate to attempt for PT eval tomorrow.  Will check again then.   Reginia Naas 08/26/2017, 11:02 AM  Magda Kiel, [place] 08/26/2017

## 2017-08-26 NOTE — Procedures (Signed)
Extubation Procedure Note  Patient Details:   Name: KARELI HOSSAIN DOB: 1951-06-29 MRN: [id number]   Airway Documentation:     Evaluation  O2 sats: stable throughout Complications: No apparent complications Patient did tolerate procedure well. Bilateral Breath Sounds: Clear, Diminished   Yes   Pt extubated to 4L N/C.  No stridor noted.  RN @ bedside.  Donnetta Hail 08/26/2017, 3:50 PM

## 2017-08-26 NOTE — Progress Notes (Signed)
Patient ID: Destiny Day, female   DOB: 11/14/1950, 67 y.o.   MRN: [id number] BP 128/63 (BP Location: Right Leg)   Pulse 85   Temp 98.2 F (36.8 C) (Oral)   Resp 14   Ht 5\' 5"  (1.651 m)   Wt 71 kg (156 lb 8.4 oz)   SpO2 98%   BMI 26.05 kg/m  Alert, sitting up in bed, following commands, speech is clear, fluent Moving right side >left Dressing is stained, dry Significant improvement since yesterday, extubated today.

## 2017-08-26 NOTE — Progress Notes (Signed)
Patient ID: Destiny Day, female   DOB: 17-Nov-1950, 67 y.o.   MRN: [id number] Intubated.  Appreciate neurosurgery's care. Right toe amputation healed Right popliteal incision healed Right groin with fat necrosis at the upper pole.  No surrounding erythema or evidence of infection.  Will probably need debridement at the bedside.

## 2017-08-26 NOTE — Progress Notes (Signed)
PT Cancellation Note  Patient Details Name: Destiny Day MRN: [id number] DOB: 1950-09-06   Cancelled Treatment:    Reason Eval/Treat Not Completed: Patient not medically ready; patient remains intubated, but noted plan to try to extubate today.  Will check back later today.   Reginia Naas 08/26/2017, 8:52 AM  Magda Kiel, [place] 08/26/2017

## 2017-08-26 NOTE — Progress Notes (Signed)
PULMONARY / CRITICAL CARE MEDICINE   Name: Destiny Day MRN: [id number] DOB: [birth year]    ADMISSION DATE:  08/23/2017 CONSULTATION DATE:  08/23/2017  REFERRING MD:  Ernst Spell MD, Posey Pronto  CHIEF COMPLAINT:  AMS and SDH  HISTORY OF PRESENT ILLNESS:   67 year old female with ESRD-HD who fell on 1/28 AM and suffered a SDH with acute AMS and respiratory failure for inability to protect her airway.  Patient is unresponsive and no history is available.  Patient is on LMW heparin prophylactic dose and plavix.  Patient was in the hospital for a toe amputation and a vascular procedure.   SUBJECTIVE:  Getting hemodialysis today may be able to extubate following hemodialysis .  Currently not on tube feedings and if not extubated will need to start tube feedings.    VITAL SIGNS: BP (!) 151/71   Pulse 76   Temp 98 F (36.7 C) (Axillary)   Resp 19   Ht 5\' 5"  (1.651 m)   Wt 72.6 kg (160 lb 0.9 oz)   SpO2 100%   BMI 26.63 kg/m   HEMODYNAMICS:    VENTILATOR SETTINGS: Vent Mode: PSV;CPAP FiO2 (%):  [40 %] 40 % Set Rate:  [15 bmp] 15 bmp Vt Set:  [450 mL] 450 mL PEEP:  [5 cmH20] 5 cmH20 Pressure Support:  [8 cmH20-10 cmH20] 10 cmH20 Plateau Pressure:  [16 cmH20-19 cmH20] 18 cmH20  INTAKE / OUTPUT: I/O last 3 completed shifts: In: 2930 [I.V.:1253; Blood:1307; Other:20; NG/GT:40; IV Piggyback:310] Out: 320 [Urine:70; Emesis/NG output:150; Blood:100]  PHYSICAL EXAMINATION: General: Well-nourished well-developed sleepy female HEENT: Endotracheal tube in place PSY: Sedated Neuro: Moves all extremities x4 follows commands CV: Heart sounds are regular PULM: Breath sounds in the bases GI: Positive bowel sounds tube feeds at this time Extremities: Left arm AV graft currently receiving dialysis Skin: no rashes or lesions   LABS:  BMET Recent Labs  Lab 08/24/17 1400 08/24/17 2022 08/25/17 0412 08/26/17 0404  NA 132* 133* 132* 133*  K 5.4* 4.0 4.2 4.8  CL 100*  --  96* 97*  CO2  21*  --  22 20*  BUN 37*  --  23* 40*  CREATININE 7.03*  --  4.49* 5.70*  GLUCOSE 172* 120* 158* 130*    Electrolytes Recent Labs  Lab 08/24/17 1400 08/25/17 0412 08/26/17 0404  CALCIUM 8.5* 8.3* 8.4*  MG  --  1.9  --   PHOS 4.9* 5.3* 6.1*    CBC Recent Labs  Lab 08/24/17 1400 08/24/17 2022 08/25/17 0412 08/26/17 0404  WBC 14.6*  --  21.0* 24.8*  HGB 6.1* 8.5* 8.3* 8.6*  HCT 18.6* 25.0* 25.6* 26.4*  PLT 407*  --  457* 497*    Coag's Recent Labs  Lab 08/23/17 1228  INR 1.02    Sepsis Markers No results for input(s): LATICACIDVEN, PROCALCITON, O2SATVEN in the last 168 hours.  ABG Recent Labs  Lab 08/24/17 0940 08/24/17 1600 08/24/17 2353  PHART 7.539* 7.450 7.350  PCO2ART 24.5* 39.5 47.5  PO2ART 134* 101.0 431.0*    Liver Enzymes Recent Labs  Lab 08/24/17 1400 08/25/17 0412 08/26/17 0404  ALBUMIN 2.2* 2.3* 2.1*    Cardiac Enzymes No results for input(s): TROPONINI, PROBNP in the last 168 hours.  Glucose Recent Labs  Lab 08/25/17 0820 08/25/17 1224 08/25/17 1631 08/25/17 1945 08/25/17 2344 08/26/17 0423  GLUCAP 155* 139* 111* 119* 142* 120*    Imaging Dg Abd Portable 1v  Result Date: 08/25/2017 CLINICAL DATA:  Evaluate OG  tube placement. EXAM: PORTABLE ABDOMEN - 1 VIEW COMPARISON:  08/25/2017 FINDINGS: The enteric tube tip is in the body of the stomach. The side port of the OG tube is below the level. No abnormal bowel dilatation. IMPRESSION: The tip of the NG tube is in the body of the stomach. Electronically Signed   By: Kerby Moors M.D.   On: 08/25/2017 16:06   Dg Abd Portable 1v  Result Date: 08/25/2017 CLINICAL DATA:  Orogastric tube placement EXAM: PORTABLE ABDOMEN - 1 VIEW COMPARISON:  Portable exam 6599 hours compared to 08/23/2017 FINDINGS: Orogastric tube is coiled in the proximal stomach with the tip reflected back into the distal esophagus. Subsegmental atelectasis LEFT lower lobe. Bowel gas pattern normal. Extensive  atherosclerotic calcifications of aorta and abdominal branch vessels. Bones demineralized with scattered degenerative changes of the thoracolumbar spine. IMPRESSION: Nasogastric tube coiled in stomach with tip reflected back into the distal esophagus directed anteriorly; recommend withdrawal and replacement. Findings called to Tammy RN on [facility] on 08/25/2017 at 1320 hours. Electronically Signed   By: Lavonia Dana M.D.   On: 08/25/2017 13:20     STUDIES:  CT head 1/28>>> large SDH  CULTURES: None  ANTIBIOTICS: None  SIGNIFICANT EVENTS: 08/11/2017>> Right Fem Pop by pass with right great toe amputation 1/28 fall and SDH to the OR 08/23/18`9>> Craniotomy, Hematoma evacuation Subdural (mass effect with 10 mm shift)  LINES/TUBES: ETT 1/28>>>1/29, 1/29>>> L IJ CVC 1/28>>> R radial  A aline>> 1/28  DISCUSSION: 67 year old female with ESRD-HD history presenting to PCCM in respiratory failure after a fall and SDH with mass effect and 10 mm shift.  08/26/2017 will try to extubate post hemodialysis.  ASSESSMENT / PLAN:  PULMONARY A: VDRF due to inability to protect airway in setting large SDH  LEFT basilar atelectasis with small effusion 1/28.  Hx OSA  Hx asthma  P:  Vent support - 8cc/kg  F/u CXR  F/u ABG VAP prevention  dulera home med  Volume removal with HD per renal - may be able to extubate post HD on 08/16/2017.  CARDIOVASCULAR A:  HTN  PVD - s/p R fem pop bypass with great toe amputation 1/16 P:  Cardene drip currently off Tele  Volume removal with HD per renal  KVO IVF  Continue clonidine, hydralazine  PRN labetalol    RENAL Recent Labs  Lab 08/24/17 2022 08/25/17 0412 08/26/17 0404  K 4.0 4.2 4.8    A:   ESRD usual HD MWF Hyponatremia P:   F/u chem  HD per renal  KVO IVF  Replete electrolytes PRN  Had partial HD 1/29 Repeat HD 1/30 and 08/26/2017 to set up for best chance of extubation.    GASTROINTESTINAL A:   No active issue  P:   NPO  for now  Add TF if not extubated today 08/26/2017 Speech post extubation  PPI   HEMATOLOGIC Recent Labs    08/25/17 0412 08/26/17 0404  HGB 8.3* 8.6*    A:   Anemia - no obvious bleeding P:  F/u CBC  aranesp per renal  Monitor for bleeding  1 unit PRBC 1/29 transfusion per protocol  INFECTIOUS A:   Leukocytosis T Max 98 3 P:   Monitor wbc, fever curve off abx  Follow cultures    ENDOCRINE CBG (last 3)  Recent Labs    08/25/17 1945 08/25/17 2344 08/26/17 0423  GLUCAP 119* 142* 120*    A:   DM   P:  SSI    NEUROLOGIC A:   Acute SDH - recurrence 1/29 Following commands, MAE x 4 L>R P:   RASS goal: 0 Per nsgy  Currently off sedation weaning as tolerated tolerated Daily WUA  ? further OR needs  PRN fentanyl    FAMILY  - Updates:  Daughter updated at bedside 1/30.  08/26/2017 no family bedside  - Inter-disciplinary family meet or Palliative Care meeting due by:  day 7  App CCT 30 min  Richardson Landry Joyel Chenette ACNP [facility] Pager [phone] till 1 pm If no answer page [phone] 08/26/2017, 8:29 AM

## 2017-08-26 NOTE — Progress Notes (Signed)
[facility] Progress Note    Subjective:   Unable to get HD yesterday so on HD this AM Still on the vent Alert, appropriately responding Objective Vitals:   08/26/17 0739 08/26/17 0745 08/26/17 0748 08/26/17 0800  BP: (!) 151/71 (!) 149/63  138/69  Pulse: 76 77  77  Resp: 19     Temp:      TempSrc:      SpO2: 100%  100%   Weight:      Height:       Physical Exam Intubated but responds appropropriately On dialysis at this time Bulky dressing from craniotomy  Facial, UE edema better Lungs ant clear S1S2 No S3 Abd soft not tender 1+ LLL edema. Trace RLE edema.  R great toe amp site clean and healed R groin healing. Leg incisions intact Dialysis Access: left upper AVG cannulated  Recent Labs  Lab 08/24/17 1400 08/24/17 2022 08/25/17 0412 08/26/17 0404  NA 132* 133* 132* 133*  K 5.4* 4.0 4.2 4.8  CL 100*  --  96* 97*  CO2 21*  --  22 20*  GLUCOSE 172* 120* 158* 130*  BUN 37*  --  23* 40*  CREATININE 7.03*  --  4.49* 5.70*  CALCIUM 8.5*  --  8.3* 8.4*  PHOS 4.9*  --  5.3* 6.1*   Recent Labs  Lab 08/24/17 1400 08/25/17 0412 08/26/17 0404  ALBUMIN 2.2* 2.3* 2.1*    Recent Labs  Lab 08/20/17 1400  08/23/17 1228 08/24/17 1400 08/24/17 2022 08/25/17 0412 08/26/17 0404  WBC 11.1*  --  13.8* 14.6*  --  21.0* 24.8*  NEUTROABS  --   --  11.2*  --   --   --   --   HGB 9.0*   < > 7.1* 6.1* 8.5* 8.3* 8.6*  HCT 28.8*   < > 21.6* 18.6* 25.0* 25.6* 26.4*  MCV 100.7*  --  99.1 100.5*  --  94.5 95.7  PLT 417*  --  449* 407*  --  457* 497*   < > = values in this interval not displayed.    Recent Labs  Lab 08/25/17 1224 08/25/17 1631 08/25/17 1945 08/25/17 2344 08/26/17 0423  GLUCAP 139* 111* 119* 142* 120*    Lab Results  Component Value Date   INR 1.02 08/23/2017   INR 1.05 08/11/2017   INR 1.03 07/07/2016   Studies/Results: Ct Head Wo Contrast  Result Date: 08/24/2017 CLINICAL DATA:  Follow-up examination status post craniotomy  for subdural evacuation. EXAM: CT HEAD WITHOUT CONTRAST TECHNIQUE: Contiguous axial images were obtained from the base of the skull through the vertex without intravenous contrast. COMPARISON:  Prior CT from 08/23/2017. FINDINGS: Brain: Postoperative changes from interval left frontal craniotomy for subdural evacuation are seen. Superimposed left frontal burr hole noted. Postoperative pneumocephalus overlies the extra-axial space anteriorly. Left frontotemporal mixed density left subdural hematoma is decreased in size now measuring up to 11 mm at the level the left frontal convexity, previously 15 mm). Left-to-right midline shift is improved now measuring 6 mm, previously 10 mm. No hydrocephalus or ventricular trapping. Basilar cisterns remain patent. No other intracranial hemorrhage. No acute large vessel territory infarct. No mass lesion. Vascular: No hyperdense vessel. Skull: Postoperative soft tissue swelling present at the left scalp. Skin staples in place. Scattered soft tissue emphysema. Craniotomy bone flap well positioned. Sinuses/Orbits: Globes and orbital soft tissues within normal limits. Scattered mucosal thickening within the sphenoid and maxillary sinuses. Paranasal sinuses are otherwise clear. Right mastoid  effusion noted. Other: None. IMPRESSION: 1. Postoperative changes from interval left frontal craniotomy for subdural evacuation. Left subdural collection decreased in size now measuring up to 11 mm in thickness with improved 6 mm of left-to-right shift. 2. No other new acute intracranial abnormality. Electronically Signed   By: Jeannine Boga M.D.   On: 08/24/2017 16:41   Dg Chest Port 1 View  Result Date: 08/25/2017 CLINICAL DATA:  Respiratory failure with ventilator dependence EXAM: PORTABLE CHEST 1 VIEW COMPARISON:  08/24/2017 FINDINGS: 0608 hours. Endotracheal tube tip 3.6 cm above the base of the carina. Right IJ central line tip overlies the SVC/RA junction. NG tube passes in the  stomach with loops back upon itself with the tip back into the distal esophagus. Heart size upper normal and stable. There is persistent basilar atelectasis. No overt edema. Telemetry leads overlie the chest. IMPRESSION: NG tube remains looped on itself in the stomach with the tip coursing back into the esophagus directed towards the head. Otherwise stable exam. These results will be called to the ordering clinician or representative by the Radiologist Assistant, and communication documented in the PACS or zVision Dashboard. Electronically Signed   By: Misty Stanley M.D.   On: 08/25/2017 09:43   Dg Chest Port 1 View  Result Date: 08/24/2017 CLINICAL DATA:  Assess endotracheal tube and line placements. EXAM: PORTABLE CHEST 1 VIEW COMPARISON:  08/23/2017 FINDINGS: Endotracheal tube tip has been retracted. It now projects 2.7 cm above the Carina, well positioned. Left internal jugular central venous line tip lies in the lower superior vena cava above the caval atrial junction. Nasal/orogastric tube curls within the stomach. The tip projects back into the distal esophagus. Cardiac silhouette is normal in size. No mediastinal or hilar masses. There is linear opacity at the left lung base consistent with atelectasis. Lungs are otherwise clear. No convincing pleural effusion.  No pneumothorax. IMPRESSION: 1. Endotracheal tube is now well positioned, tip projecting 2.7 cm above the Carina. 2. Nasal/orogastric tube extends into the stomach, but curled on itself to have its tip projecting back into the distal esophagus. This is a change from the prior study. 3. Well-positioned left internal jugular central venous line. Electronically Signed   By: Lajean Manes M.D.   On: 08/24/2017 22:02   Dg Abd Portable 1v  Result Date: 08/25/2017 CLINICAL DATA:  Evaluate OG tube placement. EXAM: PORTABLE ABDOMEN - 1 VIEW COMPARISON:  08/25/2017 FINDINGS: The enteric tube tip is in the body of the stomach. The side port of the OG  tube is below the level. No abnormal bowel dilatation. IMPRESSION: The tip of the NG tube is in the body of the stomach. Electronically Signed   By: Kerby Moors M.D.   On: 08/25/2017 16:06   Dg Abd Portable 1v  Result Date: 08/25/2017 CLINICAL DATA:  Orogastric tube placement EXAM: PORTABLE ABDOMEN - 1 VIEW COMPARISON:  Portable exam 1660 hours compared to 08/23/2017 FINDINGS: Orogastric tube is coiled in the proximal stomach with the tip reflected back into the distal esophagus. Subsegmental atelectasis LEFT lower lobe. Bowel gas pattern normal. Extensive atherosclerotic calcifications of aorta and abdominal branch vessels. Bones demineralized with scattered degenerative changes of the thoracolumbar spine. IMPRESSION: Nasogastric tube coiled in stomach with tip reflected back into the distal esophagus directed anteriorly; recommend withdrawal and replacement. Findings called to Tammy RN on [facility] on 08/25/2017 at 1320 hours. Electronically Signed   By: Lavonia Dana M.D.   On: 08/25/2017 13:20   Medications: . sodium chloride  10 mL/hr at 08/25/17 0700  . sodium chloride    . fentaNYL infusion INTRAVENOUS Stopped (08/24/17 0800)  . levETIRAcetam Stopped (08/26/17 0235)  . niCARDipine Stopped (08/24/17 1530)  . propofol (DIPRIVAN) infusion Stopped (08/25/17 0720)   . chlorhexidine gluconate (MEDLINE KIT)  15 mL Mouth Rinse BID  . Chlorhexidine Gluconate Cloth  6 each Topical Q0600  . cloNIDine  0.2 mg Oral BID  . ferric citrate  420 mg Oral TID WC  . gabapentin  100 mg Oral QHS  . hydrALAZINE  25 mg Oral 2 times per day on Sun Tue Thu Sat  . insulin aspart  2-6 Units Subcutaneous Q4H  . mouth rinse  15 mL Mouth Rinse 10 times per day  . mometasone-formoterol  2 puff Inhalation BID  . pantoprazole (PROTONIX) IV  40 mg Intravenous Daily   Dialysis Orders: [facility]   4h    66.5kg   3K/ 2.25    Profile 4    LUA AVG    Hep 2500 ,  1000 midrun Venofer 11m IV qwk - last 1/11 Mircera  348m IV q2wks - last 1/09  Assessment: 1. PVD s/p right fem po bypass with right great toe amp 1/16  2. S/p fall, traumatic SDH with midline shift s/p  [facility]. Stable neuro, still on vent 3. ESRD - usual MWF. Had only 2 hours HD 1/29, had planned for HD 1/30 not started till this AM d/t staffing issue. Goal 3 liters no heparin. (Off schedule) 4. Anemia - Transfused 1 unit with HD. Aranesp 150 today 5. Secondary hyperparathyroidism - no binders NPO 6. Hypertension. Meds. Off nicardipine. Volume off with HD 7. Nutrition - Supplements 8. DM primary service 9. N/ V - Issue last week. It resolved off of binders. Resume velphoro when taking po again. (don't give phoslo as caused N/V).    CyJamal MaesMD [facility] [phone] 08/26/2017, 8:36 AM           ]

## 2017-08-26 NOTE — Procedures (Signed)
I have personally attended this patient's dialysis session.   2K bath (K 4.8) 3L goal No heparin L AVG no issues Stable BP  Jamal Maes, MD [facility] [phone] Pager 08/26/2017, 8:44 AM

## 2017-08-26 NOTE — Progress Notes (Signed)
OT Cancellation Note  Patient Details Name: Destiny Day MRN: [id number] DOB: 15-Nov-1950   Cancelled Treatment:    Reason Eval/Treat Not Completed: Patient not medically ready.  Pt continues to be intubated with possible extubation today after HD.  Will check back.  [place], OTR/L [phone]   Lucille Passy M 08/26/2017, 10:39 AM

## 2017-08-27 ENCOUNTER — Other Ambulatory Visit: Payer: Self-pay

## 2017-08-27 ENCOUNTER — Inpatient Hospital Stay (HOSPITAL_COMMUNITY): Payer: Medicare Other

## 2017-08-27 DIAGNOSIS — N186 End stage renal disease: Secondary | ICD-10-CM | POA: Diagnosis not present

## 2017-08-27 DIAGNOSIS — I159 Secondary hypertension, unspecified: Secondary | ICD-10-CM | POA: Diagnosis not present

## 2017-08-27 DIAGNOSIS — Z992 Dependence on renal dialysis: Secondary | ICD-10-CM | POA: Diagnosis not present

## 2017-08-27 LAB — TYPE AND SCREEN
ABO/RH(D): B POS
Antibody Screen: POSITIVE
DAT, IgG: NEGATIVE
Donor AG Type: NEGATIVE
Donor AG Type: NEGATIVE
Donor AG Type: NEGATIVE
Donor AG Type: NEGATIVE
Donor AG Type: NEGATIVE
Donor AG Type: NEGATIVE
Unit division: 0
Unit division: 0
Unit division: 0
Unit division: 0
Unit division: 0
Unit division: 0
Unit division: 0

## 2017-08-27 LAB — BPAM RBC
Blood Product Expiration Date: 201902042359
Blood Product Expiration Date: 201902082359
Blood Product Expiration Date: 201902192359
Blood Product Expiration Date: 201902192359
Blood Product Expiration Date: 201902192359
Blood Product Expiration Date: 201902192359
Blood Product Expiration Date: 201902202359
ISSUE DATE / TIME: 201901291517
ISSUE DATE / TIME: 201901291732
Unit Type and Rh: 1700
Unit Type and Rh: 1700
Unit Type and Rh: 7300
Unit Type and Rh: 7300
Unit Type and Rh: 7300
Unit Type and Rh: 7300
Unit Type and Rh: 7300

## 2017-08-27 LAB — BASIC METABOLIC PANEL
Anion gap: 16 — ABNORMAL HIGH (ref 5–15)
BUN: 27 mg/dL — ABNORMAL HIGH (ref 6–20)
CO2: 22 mmol/L (ref 22–32)
Calcium: 8.2 mg/dL — ABNORMAL LOW (ref 8.9–10.3)
Chloride: 97 mmol/L — ABNORMAL LOW (ref 101–111)
Creatinine, Ser: 3.87 mg/dL — ABNORMAL HIGH (ref 0.44–1.00)
GFR calc Af Amer: 13 mL/min — ABNORMAL LOW (ref >60)
GFR calc non Af Amer: 11 mL/min — ABNORMAL LOW (ref >60)
Glucose, Bld: 101 mg/dL — ABNORMAL HIGH (ref 65–99)
Potassium: 3.7 mmol/L (ref 3.5–5.1)
Sodium: 135 mmol/L (ref 135–145)

## 2017-08-27 LAB — MAGNESIUM: Magnesium: 1.9 mg/dL (ref 1.7–2.4)

## 2017-08-27 LAB — GLUCOSE, CAPILLARY
Glucose-Capillary: 113 mg/dL — ABNORMAL HIGH (ref 65–99)
Glucose-Capillary: 108 mg/dL — ABNORMAL HIGH (ref 65–99)
Glucose-Capillary: 76 mg/dL (ref 65–99)
Glucose-Capillary: 101 mg/dL — ABNORMAL HIGH (ref 65–99)

## 2017-08-27 LAB — MRSA PCR SCREENING: MRSA by PCR: NEGATIVE

## 2017-08-27 LAB — PHOSPHORUS: Phosphorus: 4.6 mg/dL (ref 2.5–4.6)

## 2017-08-27 NOTE — Progress Notes (Signed)
Patient ID: Destiny Day, female   DOB: 04-24-1951, 67 y.o.   MRN: [id number] BP (!) 152/61   Pulse 87   Temp (!) 97.4 F (36.3 C) (Oral)   Resp 14   Ht 5\' 5"  (1.651 m)   Wt 71 kg (156 lb 8.4 oz)   SpO2 98%   BMI 26.05 kg/m  Alert and following commands, initiating conversation Moving extremities Much improved wounds are clean and dry, dressing

## 2017-08-27 NOTE — Progress Notes (Signed)
PT Cancellation Note  Patient Details Name: Destiny Day MRN: [id number] DOB: 09-Oct-1950   Cancelled Treatment:    Reason Eval/Treat Not Completed: Medical issues which prohibited therapy(Pt on bedrest.  Called Dr. Christella Noa and await new orders.  )   Denice Paradise 08/27/2017, 12:19 PM [place] [phone] (pager)

## 2017-08-27 NOTE — Progress Notes (Signed)
I met with pt and her spouse at bedside. Pt alert and talkative. I await PT and OT evaluations to assist with planning dispo when pt medically ready. Pt extubated yesterday. Family hopeful to readmit to inpt rehab. I will follow up on Monday. [phone]

## 2017-08-27 NOTE — Progress Notes (Addendum)
[facility] Progress Note    Subjective:   Extubated, moved to 65M Awake, alert, talking, appropriate Had HD yesterday (off schedule) 4 liters removed Weight to 71 kg  Objective Vitals:   08/27/17 0800 08/27/17 0837 08/27/17 0840 08/27/17 0900  BP: 98/70  98/70 (!) 169/64  Pulse: 87  85 85  Resp: _0 Temp:   (!) 97.4 F (36.3 C)   TempSrc:   Oral   SpO2: 99% 98% 97% 97%  Weight:      Height:       Physical Exam Awake, talking, appropriate and wants food Bulky dressing from craniotomy  L IJ central line Lungs ant clear S1S2 No S3 Abd soft not tender Trace edema LE's  SCD's in place  R great toe amp site clean and healed R groin healing dressed. Other incisions healing Dialysis Access: left upper AVG   Recent Labs  Lab 08/25/17 0412 08/26/17 0404 08/27/17 0400  NA 132* 133* 135  K 4.2 4.8 3.7  CL 96* 97* 97*  CO2 22 20* 22  GLUCOSE 158* 130* 101*  BUN 23* 40* 27*  CREATININE 4.49* 5.70* 3.87*  CALCIUM 8.3* 8.4* 8.2*  PHOS 5.3* 6.1* 4.6   Recent Labs  Lab 08/24/17 1400 08/25/17 0412 08/26/17 0404  ALBUMIN 2.2* 2.3* 2.1*    Recent Labs  Lab 08/20/17 1400  08/23/17 1228 08/24/17 1400 08/24/17 2022 08/25/17 0412 08/26/17 0404  WBC 11.1*  --  13.8* 14.6*  --  21.0* 24.8*  NEUTROABS  --   --  11.2*  --   --   --   --   HGB 9.0*   < > 7.1* 6.1* 8.5* 8.3* 8.6*  HCT 28.8*   < > 21.6* 18.6* 25.0* 25.6* 26.4*  MCV 100.7*  --  99.1 100.5*  --  94.5 95.7  PLT 417*  --  449* 407*  --  457* 497*   < > = values in this interval not displayed.    Recent Labs  Lab 08/26/17 1701 08/26/17 2014 08/27/17 0005 08/27/17 0347 08/27/17 0839  GLUCAP 114* 94 99 113* 108*    Lab Results  Component Value Date   INR 1.02 08/23/2017   INR 1.05 08/11/2017   INR 1.03 07/07/2016   Studies/Results: Dg Chest Port 1 View  Result Date: 08/27/2017 CLINICAL DATA:  Abnormal respiration EXAM: PORTABLE CHEST 1 VIEW COMPARISON:  Yesterday  FINDINGS: Extubation. Stable left IJ catheter with tip at the upper cavoatrial junction. Streaky opacities at the bases compatible with atelectasis. No edema, effusion, or pneumothorax. Cardiomegaly. Atherosclerosis. IMPRESSION: 1. Stable inflation after extubation. 2. Lower lobe atelectasis. Electronically Signed   By: Monte Fantasia M.D.   On: 08/27/2017 08:11   Dg Chest Portable 1 View  Result Date: 08/26/2017 CLINICAL DATA:  Check endotracheal tube placement EXAM: PORTABLE CHEST 1 VIEW COMPARISON:  08/25/2017 FINDINGS: Cardiac shadow is stable. Left jugular line, endotracheal tube and nasogastric catheter are again seen in satisfactory position. The lungs are well aerated bilaterally. Minimal basilar atelectasis is again noted and stable. No new focal infiltrate is seen. IMPRESSION: Mild bibasilar atelectasis stable from the prior exam. Tubes and lines stable in appearance. Electronically Signed   By: Inez Catalina M.D.   On: 08/26/2017 08:53   Dg Abd Portable 1v  Result Date: 08/25/2017 CLINICAL DATA:  Evaluate OG tube placement. EXAM: PORTABLE ABDOMEN - 1 VIEW COMPARISON:  08/25/2017 FINDINGS: The enteric tube tip is in the body of the  stomach. The side port of the OG tube is below the level. No abnormal bowel dilatation. IMPRESSION: The tip of the NG tube is in the body of the stomach. Electronically Signed   By: Kerby Moors M.D.   On: 08/25/2017 16:06   Dg Abd Portable 1v  Result Date: 08/25/2017 CLINICAL DATA:  Orogastric tube placement EXAM: PORTABLE ABDOMEN - 1 VIEW COMPARISON:  Portable exam 0630 hours compared to 08/23/2017 FINDINGS: Orogastric tube is coiled in the proximal stomach with the tip reflected back into the distal esophagus. Subsegmental atelectasis LEFT lower lobe. Bowel gas pattern normal. Extensive atherosclerotic calcifications of aorta and abdominal branch vessels. Bones demineralized with scattered degenerative changes of the thoracolumbar spine. IMPRESSION: Nasogastric  tube coiled in stomach with tip reflected back into the distal esophagus directed anteriorly; recommend withdrawal and replacement. Findings called to Tammy RN on [facility] on 08/25/2017 at 1320 hours. Electronically Signed   By: Lavonia Dana M.D.   On: 08/25/2017 13:20   Medications: . sodium chloride 10 mL/hr at 08/27/17 0135  . sodium chloride    . fentaNYL infusion INTRAVENOUS Stopped (08/24/17 0800)  . levETIRAcetam Stopped (08/27/17 0150)  . niCARDipine Stopped (08/24/17 1530)  . propofol (DIPRIVAN) infusion Stopped (08/25/17 0720)   . chlorhexidine gluconate (MEDLINE KIT)  15 mL Mouth Rinse BID  . Chlorhexidine Gluconate Cloth  6 each Topical Q0600  . cloNIDine  0.2 mg Oral BID  . darbepoetin (ARANESP) injection - DIALYSIS  150 mcg Intravenous Q Thu-HD  . ferric citrate  420 mg Oral TID WC  . gabapentin  100 mg Oral QHS  . hydrALAZINE  25 mg Oral 2 times per day on Sun Tue Thu Sat  . insulin aspart  2-6 Units Subcutaneous Q4H  . mouth rinse  15 mL Mouth Rinse BID  . mometasone-formoterol  2 puff Inhalation BID  . pantoprazole (PROTONIX) IV  40 mg Intravenous Daily   Dialysis Orders: [facility]   4h    66.5kg   3K/ 2.25    Profile 4    LUA AVG    Hep 2500 , Will not longer receive heparin post SDH 1000 midrun Venofer 59m IV qwk - last 1/11 Mircera 333m IV q2wks - last 1/09  Assessment: 1. PVD s/p right fem pop bypass with right great toe amp 1/16  2. S/p fall, traumatic SDH with midline shift s/p  [facility]. Stable neuro now, extubated 3. [place] 4 liters off. Still 4 kg over EDW but no resp compromise and labs OK. Plan for next HD tomorrow, will get back on schedule before time to be discharged. No heparin with HD for foreseeable future.  4. Anemia - Transfused 2U 1/29. Aranesp 150 with HD 1/30. Check Fe studies  5. Secondary hyperparathyroidism - no binders  still NPO phos fine right now 6. Hypertension. Meds.  7. Nutrition - Supplements when po 8. DM primary service 9. N/ V - Issue last week. It resolved off of binders. Resume velphoro when taking po again. (don't give phoslo as caused N/V).    CyJamal MaesMD [facility] [phone] 08/27/2017, 10:17 AM           ]

## 2017-08-27 NOTE — Progress Notes (Signed)
Patient ID: Destiny Day, female   DOB: 1950/10/23, 67 y.o.   MRN: [id number] Extubated.  Answering questions.  Recognizes me. Some fat necrosis in the upper pole but no evidence of infection.  Popliteal incision and toe amputation healing nicely. Please call over the weekend for vascular concerns.

## 2017-08-27 NOTE — Progress Notes (Signed)
OT Cancellation Note  Patient Details Name: Destiny Day MRN: [id number] DOB: 1950-09-11   Cancelled Treatment:    Reason Eval/Treat Not Completed: Patient not medically ready(pt with bedrest order). PT contacted Dr. Cyndy Freeze.  [name] 08/27/2017, 12:19 PM  08/27/2017 Nestor Lewandowsky, OTR/L Pager: [phone]

## 2017-08-28 LAB — RENAL FUNCTION PANEL
Albumin: 2.1 g/dL — ABNORMAL LOW (ref 3.5–5.0)
Anion gap: 17 — ABNORMAL HIGH (ref 5–15)
BUN: 38 mg/dL — ABNORMAL HIGH (ref 6–20)
CO2: 22 mmol/L (ref 22–32)
Calcium: 8.6 mg/dL — ABNORMAL LOW (ref 8.9–10.3)
Chloride: 97 mmol/L — ABNORMAL LOW (ref 101–111)
Creatinine, Ser: 5.29 mg/dL — ABNORMAL HIGH (ref 0.44–1.00)
GFR calc Af Amer: 9 mL/min — ABNORMAL LOW (ref >60)
GFR calc non Af Amer: 8 mL/min — ABNORMAL LOW (ref >60)
Glucose, Bld: 91 mg/dL (ref 65–99)
Phosphorus: 5.1 mg/dL — ABNORMAL HIGH (ref 2.5–4.6)
Potassium: 3.9 mmol/L (ref 3.5–5.1)
Sodium: 136 mmol/L (ref 135–145)

## 2017-08-28 LAB — GLUCOSE, CAPILLARY
Glucose-Capillary: 111 mg/dL — ABNORMAL HIGH (ref 65–99)
Glucose-Capillary: 120 mg/dL — ABNORMAL HIGH (ref 65–99)
Glucose-Capillary: 79 mg/dL (ref 65–99)
Glucose-Capillary: 80 mg/dL (ref 65–99)
Glucose-Capillary: 92 mg/dL (ref 65–99)
Glucose-Capillary: 95 mg/dL (ref 65–99)

## 2017-08-28 LAB — CBC
HCT: 28.1 % — ABNORMAL LOW (ref 36.0–46.0)
Hemoglobin: 8.9 g/dL — ABNORMAL LOW (ref 12.0–15.0)
MCH: 31.3 pg (ref 26.0–34.0)
MCHC: 31.7 g/dL (ref 30.0–36.0)
MCV: 98.9 fL (ref 78.0–100.0)
Platelets: 471 10*3/uL — ABNORMAL HIGH (ref 150–400)
RBC: 2.84 MIL/uL — ABNORMAL LOW (ref 3.87–5.11)
RDW: 16.8 % — ABNORMAL HIGH (ref 11.5–15.5)
WBC: 13.3 10*3/uL — ABNORMAL HIGH (ref 4.0–10.5)

## 2017-08-28 LAB — IRON AND TIBC
Iron: 31 ug/dL (ref 28–170)
Saturation Ratios: 22 % (ref 10.4–31.8)
TIBC: 140 ug/dL — ABNORMAL LOW (ref 250–450)
UIBC: 109 ug/dL

## 2017-08-28 LAB — FERRITIN: Ferritin: 1854 ng/mL — ABNORMAL HIGH (ref 11–307)

## 2017-08-28 MED ORDER — SODIUM CHLORIDE 0.9 % IV SOLN
125.0000 mg | INTRAVENOUS | Status: DC
  Administered 2017-08-30: 125 mg via INTRAVENOUS
  Filled 2017-08-28 (×2): qty 10

## 2017-08-28 MED ORDER — SODIUM CHLORIDE 0.9 % IV SOLN
125.0000 mg | INTRAVENOUS | Status: AC
  Administered 2017-08-28: 125 mg via INTRAVENOUS
  Filled 2017-08-28: qty 10

## 2017-08-28 NOTE — Evaluation (Signed)
Physical Therapy Evaluation Patient Details Name: Destiny Day MRN: [id number] DOB: [birth year] Today's Date: 08/28/2017   History of Present Illness  Pt originally adm with right foot ischemia with gangrene right great toe and underwent rt great toe amputation and RLE bypass graft on 1/16.  She transferred to CIR on 1/08/27/17.  On the morning of 08/23/17, pt sustained a fall on CIR striking her head.  Head CT showed Large Lt SDH measuring up to 56mm, significant mass effect wtih effacement of the Lt lateral ventricle, 66mm midline shift, developing uncal herniation.  She was transferred back to acute and underwent emergent decompressive craniotomy.  On 1/29, she was taken back to OR for crani due to recurrent acute SDH.  She was extubated 08/26/17.   PMH - htn, dm, asthma, anxiety, chf, ckd, LLE bypass graft, ESRD on HD, lt transmet amputation  Clinical Impression  Pt admitted with above diagnosis. Pt currently with functional limitations due to the deficits listed below (see PT Problem List). Ptwas able to squat pivot to chair with max assist.  Pt lethargic but was responding although slow to respond.  Agree with plan to return to Rehab when medically stable.  Will follow acutely.  Pt will benefit from skilled PT to increase their independence and safety with mobility to allow discharge to the venue listed below.      Follow Up Recommendations CIR    Equipment Recommendations  None recommended by PT    Recommendations for Other Services Rehab consult     Precautions / Restrictions Precautions Precautions: Fall Required Braces or Orthoses: Other Brace/Splint Other Brace/Splint: Per rehab notes, pt had Darco shoe for Rt LE  Restrictions Weight Bearing Restrictions: Yes RLE Weight Bearing: Weight bearing as tolerated      Mobility  Bed Mobility Overal bed mobility: Needs Assistance Bed Mobility: Supine to Sit     Supine to sit: Max assist     General bed mobility comments:  assist to move LE off bed and to lift trunk from bed   Transfers Overall transfer level: Needs assistance Equipment used: 2 person hand held assist Transfers: Sit to/from Stand;Stand Pivot Transfers Sit to Stand: Max assist;+2 physical assistance Stand pivot transfers: Max assist;+2 physical assistance       General transfer comment: assist to power up into standing.  bil. knees blocked to prevent buckling.  Assist provided at bil. ischiums to extend hips.  Assist to pivot feet and swing hips to chair.    Ambulation/Gait             General Gait Details: Not attempted more than bed to chair due to nausea and vomiting.  Stairs            Wheelchair Mobility    Modified Rankin (Stroke Patients Only)       Balance Overall balance assessment: Needs assistance Sitting-balance support: Feet supported;Bilateral upper extremity supported Sitting balance-Leahy Scale: Poor Sitting balance - Comments: requires min - mod A to maintain static sitting EOB    Standing balance support: Bilateral upper extremity supported Standing balance-Leahy Scale: Poor Standing balance comment: requires max A +2                              Pertinent Vitals/Pain Pain Assessment: Faces Faces Pain Scale: Hurts little more Pain Location: R LE Pain Descriptors / Indicators: Sore Pain Intervention(s): Limited activity within patient's tolerance;Monitored during session;Premedicated before session;Repositioned  VSS  Home  Living Family/patient expects to be discharged to:: Inpatient rehab Living Arrangements: Spouse/significant other Available Help at Discharge: Family;Available 24 hours/day Type of Home: House Home Access: Stairs to enter Entrance Stairs-Rails: Left Entrance Stairs-Number of Steps: 2 Home Layout: Multi-level;Bed/bath upstairs Home Equipment: Walker - 4 wheels;Cane - single point;Shower seat;Crutches Additional Comments: 2 rollators at home, walker, S[place]     Prior Function Level of Independence: Independent with assistive device(s)         Comments: walked with cane     Hand Dominance   Dominant Hand: Right    Extremity/Trunk Assessment   Upper Extremity Assessment Upper Extremity Assessment: Defer to OT evaluation RUE Deficits / Details: movement consistent with Brunnstrom stage IV UE and hand  RUE Sensation: decreased light touch LUE Deficits / Details: UE brunnstrom stage IV, and hand stage V     Lower Extremity Assessment Lower Extremity Assessment: RLE deficits/detail;LLE deficits/detail RLE Deficits / Details: grossly 3/5 except ankle 3-/5 LLE Deficits / Details: grossly 3/5    Cervical / Trunk Assessment Cervical / Trunk Assessment: Other exceptions Cervical / Trunk Exceptions: flexed posture.  Decreased isolation and activation of trunk movements   Communication   Communication: No difficulties  Cognition Arousal/Alertness: Lethargic Behavior During Therapy: Flat affect Overall Cognitive Status: Impaired/Different from baseline Area of Impairment: Attention;Following commands;Safety/judgement;Problem solving                   Current Attention Level: Sustained;Focused   Following Commands: Follows one step commands inconsistently;Follows one step commands with increased time     Problem Solving: Slow processing;Decreased initiation;Difficulty sequencing;Requires verbal cues;Requires tactile cues General Comments: Pt appropriate but lethargic.  Requires increased time to initiate activity.  Moves very slowly       General Comments      Exercises General Exercises - Lower Extremity Long Arc Quad: AROM;Both;5 reps;Seated   Assessment/Plan    PT Assessment Patient needs continued PT services  PT Problem List Decreased strength;Decreased activity tolerance;Decreased balance;Decreased mobility;Pain;Decreased knowledge of precautions;Decreased safety awareness;Decreased knowledge of use of DME;Decreased  cognition       PT Treatment Interventions DME instruction;Gait training;Stair training;Functional mobility training;Therapeutic activities;Therapeutic exercise;Balance training;Patient/family education    PT Goals (Current goals can be found in the Care Plan section)  Acute Rehab PT Goals Patient Stated Goal: Return to rehab and regain independence  PT Goal Formulation: With patient Time For Goal Achievement: 09/09/17 Potential to Achieve Goals: Good    Frequency Min 3X/week   Barriers to discharge Inaccessible home environment;Decreased caregiver support      Co-evaluation PT/OT/SLP Co-Evaluation/Treatment: Yes Reason for Co-Treatment: Complexity of the patient's impairments (multi-system involvement);For patient/therapist safety;To address functional/ADL transfers PT goals addressed during session: Mobility/safety with mobility;Proper use of DME;Strengthening/ROM OT goals addressed during session: ADL's and self-care;Strengthening/ROM       AM-PAC PT "6 Clicks" Daily Activity  Outcome Measure Difficulty turning over in bed (including adjusting bedclothes, sheets and blankets)?: Unable Difficulty moving from lying on back to sitting on the side of the bed? : Unable Difficulty sitting down on and standing up from a chair with arms (e.g., wheelchair, bedside commode, etc,.)?: Unable Help needed moving to and from a bed to chair (including a wheelchair)?: A Lot Help needed walking in hospital room?: A Lot Help needed climbing 3-5 steps with a railing? : Total 6 Click Score: 8    End of Session Equipment Utilized During Treatment: Gait belt Activity Tolerance: Patient limited by fatigue;Patient limited by lethargy Patient left: in chair;with  call bell/phone within reach;with chair alarm set;with family/visitor present Nurse Communication: Mobility status;Need for lift equipment PT Visit Diagnosis: Unsteadiness on feet (R26.81);Other abnormalities of gait and mobility  (R26.89);Pain Pain - Right/Left: Right Pain - part of body: Ankle and joints of foot    Time: 0902-0931 PT Time Calculation (min) (ACUTE ONLY): 29 min   Charges:   PT Evaluation $PT Eval Moderate Complexity: 1 Mod     PT G Codes:        Kilah Drahos,PT Acute Rehabilitation [phone] (pager)   Denice Paradise 08/28/2017, 10:32 AM

## 2017-08-28 NOTE — Progress Notes (Signed)
Patient had a change in mental status after moving her from chair back to bed.  She became very tearful and asking for a Dr Cage and yelling for him by this name.  We can not identify a Dr by this name.  Contacted Dr Trenton Gammon with neurosurgery.  Per Dr Trenton Gammon ok for patient to go to dialysis.

## 2017-08-28 NOTE — Evaluation (Signed)
Occupational Therapy Evaluation Patient Details Name: Destiny Day MRN: [id number] DOB: 09-02-1950 Today's Date: 08/28/2017    History of Present Illness Pt originally adm with right foot ischemia with gangrene right great toe and underwent rt great toe amputation and RLE bypass graft on 1/16.  She transferred to CIR on 1/08/27/17.  On the morning of 08/23/17, pt sustained a fall on CIR striking her head.  Head CT showed Large Lt SDH measuring up to 24mm, significant mass effect wtih effacement of the Lt lateral ventricle, 2mm midline shift, developing uncal herniation.  She was transferred back to acute and underwent emergent decompressive craniotomy.  On 1/29, she was taken back to OR for crani due to recurrent acute SDH.  She was extubated 08/26/17.   PMH - htn, dm, asthma, anxiety, chf, ckd, LLE bypass graft, ESRD on HD, lt transmet amputation   Clinical Impression   Pt admitted with above. She demonstrates the below listed deficits and will benefit from continued OT to maximize safety and independence with BADLs.  Pt presents to OT with generalized weakness, impaired attention, initiation, problem solving, decreased balance, decreased bil. UE strength and function.  She currently requires max - total a for ADLs, and max A +2 for functional transfers.  Pt lethargic throughout eval.   Pt lived with spouse PTA, and was independent.  Plan is to return to CIR for further rehab prior to discharge home.  Will follow acutely.       Follow Up Recommendations  CIR    Equipment Recommendations  None recommended by OT    Recommendations for Other Services Rehab consult     Precautions / Restrictions Precautions Precautions: Fall Required Braces or Orthoses: Other Brace/Splint Other Brace/Splint: Per rehab notes, pt had Darco shoe for Rt LE  Restrictions Weight Bearing Restrictions: Yes RLE Weight Bearing: Weight bearing as tolerated      Mobility Bed Mobility Overal bed mobility: Needs  Assistance Bed Mobility: Supine to Sit     Supine to sit: Max assist     General bed mobility comments: assist to move LE off bed and to lift trunk from bed   Transfers Overall transfer level: Needs assistance Equipment used: 2 person hand held assist Transfers: Sit to/from Stand;Stand Pivot Transfers Sit to Stand: Max assist;+2 physical assistance Stand pivot transfers: Max assist;+2 physical assistance       General transfer comment: assist to power up into standing.  bil. knees blocked to prevent buckling.  Assist provided at bil. ischiums to extend hips.  Assist to pivot feet and swing hips to chair.      Balance Overall balance assessment: Needs assistance Sitting-balance support: Feet supported;Bilateral upper extremity supported Sitting balance-Leahy Scale: Poor Sitting balance - Comments: requires min - mod A to maintain static sitting EOB    Standing balance support: Bilateral upper extremity supported Standing balance-Leahy Scale: Poor Standing balance comment: requires max A +2                            ADL either performed or assessed with clinical judgement   ADL Overall ADL's : Needs assistance/impaired Eating/Feeding: Maximal assistance;Sitting;Bed level   Grooming: Wash/dry hands;Wash/dry face;Oral care;Maximal assistance;Sitting   Upper Body Bathing: Total assistance;Sitting   Lower Body Bathing: Total assistance;Sit to/from stand   Upper Body Dressing : Total assistance;Sitting   Lower Body Dressing: Total assistance;Sit to/from stand   Toilet Transfer: Maximal assistance;+2 for physical assistance;Stand-pivot;BSC   Toileting-  Clothing Manipulation and Hygiene: Total assistance;Sit to/from stand       Functional mobility during ADLs: Maximal assistance;+2 for physical assistance General ADL Comments: activity limited by lethargy.  She requires a significant amount of time to initiate and execute movement/activity      Vision  Baseline Vision/History: Wears glasses Wears Glasses: At all times Patient Visual Report: No change from baseline Additional Comments: Did not formally assess as pt fatigued at end of session, and keeping eyes closed      Perception Perception Perception Tested?: Yes   Praxis Praxis Praxis tested?: Within functional limits    Pertinent Vitals/Pain Pain Assessment: No/denies pain     Hand Dominance Right   Extremity/Trunk Assessment Upper Extremity Assessment Upper Extremity Assessment: RUE deficits/detail;LUE deficits/detail RUE Deficits / Details: movement consistent with Brunnstrom stage IV UE and hand  RUE Sensation: decreased light touch LUE Deficits / Details: UE brunnstrom stage IV, and hand stage V    Lower Extremity Assessment Lower Extremity Assessment: Defer to PT evaluation   Cervical / Trunk Assessment Cervical / Trunk Assessment: Other exceptions Cervical / Trunk Exceptions: flexed posture.  Decreased isolation and activation of trunk movements    Communication Communication Communication: No difficulties   Cognition Arousal/Alertness: Lethargic Behavior During Therapy: Flat affect Overall Cognitive Status: Impaired/Different from baseline Area of Impairment: Attention;Following commands;Safety/judgement;Problem solving                   Current Attention Level: Sustained;Focused   Following Commands: Follows one step commands inconsistently;Follows one step commands with increased time     Problem Solving: Slow processing;Decreased initiation;Difficulty sequencing;Requires verbal cues;Requires tactile cues General Comments: Pt appropriate but lethargic.  Requires increased time to initiate activity.  Moves very slowly    General Comments       Exercises     Shoulder Instructions      Home Living Family/patient expects to be discharged to:: Inpatient rehab Living Arrangements: Spouse/significant other Available Help at Discharge:  Family;Available 24 hours/day Type of Home: House Home Access: Stairs to enter [employer] of Steps: 2 Entrance Stairs-Rails: Left Home Layout: Multi-level;Bed/bath upstairs Alternate Level Stairs-Number of Steps: 6 and 6 split level Alternate Level Stairs-Rails: Left Bathroom Shower/Tub: Tub/shower unit;Curtain   Bathroom Toilet: Standard Bathroom Accessibility: Yes How Accessible: Accessible via walker Home Equipment: [place] - 4 wheels;Cane - single point;Shower seat;Crutches   Additional Comments: 2 rollators at home, walker, [place]  Lives With: Spouse;Other (Comment)    Prior Functioning/Environment Level of Independence: Independent with assistive device(s)        Comments: walked with cane        OT Problem List: Decreased strength;Decreased range of motion;Decreased activity tolerance;Impaired balance (sitting and/or standing);Impaired vision/perception;Decreased coordination;Decreased cognition;Decreased safety awareness;Decreased knowledge of use of DME or AE;Obesity;Impaired UE functional use      OT Treatment/Interventions: Self-care/ADL training;Therapeutic exercise;Neuromuscular education;Energy conservation;DME and/or AE instruction;Manual therapy;Therapeutic activities;Cognitive remediation/compensation;Visual/perceptual remediation/compensation;Patient/family education;Balance training    OT Goals(Current goals can be found in the care plan section) Acute Rehab OT Goals Patient Stated Goal: Return to rehab and regain independence  OT Goal Formulation: With patient/family Time For Goal Achievement: 09/11/17 Potential to Achieve Goals: Good ADL Goals Pt Will Perform Eating: with set-up;with supervision;with adaptive utensils;sitting Pt Will Perform Grooming: with min assist;sitting;with adaptive equipment Pt Will Perform Upper Body Bathing: with mod assist;sitting Pt Will Perform Lower Body Bathing: with max assist;sit to/from stand Pt Will Transfer to  Toilet: with mod assist;stand pivot transfer;bedside commode Pt Will Perform Toileting -  Clothing Manipulation and hygiene: with max assist;sit to/from stand Additional ADL Goal #1: Pt will sustain attention to familiar ADL task x 5 mins with min cues  OT Frequency: Min 2X/week   Barriers to D/C:            Co-evaluation PT/OT/SLP Co-Evaluation/Treatment: Yes Reason for Co-Treatment: Complexity of the patient's impairments (multi-system involvement);For patient/therapist safety;Necessary to address cognition/behavior during functional activity;To address functional/ADL transfers   OT goals addressed during session: ADL's and self-care;Strengthening/ROM      AM-PAC PT "6 Clicks" Daily Activity     Outcome Measure Help from another person eating meals?: A Lot Help from another person taking care of personal grooming?: A Lot Help from another person toileting, which includes using toliet, bedpan, or urinal?: A Lot Help from another person bathing (including washing, rinsing, drying)?: A Lot Help from another person to put on and taking off regular upper body clothing?: Total Help from another person to put on and taking off regular lower body clothing?: Total 6 Click Score: 10   End of Session Equipment Utilized During Treatment: Gait belt Nurse Communication: Mobility status;Need for lift equipment  Activity Tolerance: Patient tolerated treatment well;Patient limited by fatigue Patient left: in chair;with call bell/phone within reach;with nursing/sitter in room  OT Visit Diagnosis: Unsteadiness on feet (R26.81);Muscle weakness (generalized) (M62.81);Cognitive communication deficit (R41.841)                Time: 8721-5872 OT Time Calculation (min): 33 min Charges:  OT General Charges $OT Visit: 1 Visit OT Evaluation $OT Eval Moderate Complexity: 1 Mod G-Codes:     [facility], OTR/L [phone]   Lucille Passy M 08/28/2017, 9:55 AM

## 2017-08-28 NOTE — Progress Notes (Signed)
Patient alert and pleasant, voice complaints of pain in the right groin.  Medicated with Norco per prn order.  Will monitor effectiveness.  Bilat AV fistulas + for bruit and thrill.  Dressing remains intact on left AV fistula site r/t recent HD treatment.  Left IJ patent and Kepra infused per order.  No signs or symptoms of neuro complications noted at this time.  Family by her side.  Family stated that when the patient was joking and laughing with the nurse that this was "more like herself."  This nurse will continue to monitor patient.  Safety and comfort maintained.  Call bell within reach.

## 2017-08-28 NOTE — Progress Notes (Signed)
Nutrition Brief Note  RD consulted regarding appropriate diet order for pt.  Per review of chart, patient has not been evaluated by ST since she was extubated. SLP would need to assess to determine safest diet order for patient. Recommend placing speech therapy consult.   Once diet is advanced, would recommend RENAL diet option for optimal electrolyte/fluid management, however if intake is poor, could be placed on a more liberalized diet ([place])  No nutrition interventions warranted at this time. If nutrition issues arise, please REconsult RD.   Burtis Junes RD, LDN, CNSC Clinical Nutrition Pager: [phone] 08/28/2017 12:23 PM

## 2017-08-28 NOTE — Progress Notes (Signed)
[facility] Progress Note    Subjective:   Remains awake, alert, moving all extremities and following commands Wants to eat RN concerned about groin wound (drainage, smell)  Objective Vitals:   08/28/17 0029 08/28/17 0032 08/28/17 0400 08/28/17 0700  BP: (!) 181/67     Pulse: 92     Resp: 15     Temp:   98.1 F (36.7 C) 98.3 F (36.8 C)  TempSrc:   Oral Oral  SpO2: 98%     Weight:  72.8 kg (160 lb 7.9 oz)    Height:       Physical Exam Awake, talking, appropriate and wants food Bulky dressing from craniotomy  L IJ central line Lungs ant clear S1S2 No S3 Abd soft not tender Trace edema LE's  SCD's in place  R great toe amp site clean and healed R groin wound fmelling exudate, eschar "floating" with drainage from underneat Dialysis Access: left upper AVG + bruit   Recent Labs  Lab 08/26/17 0404 08/27/17 0400 08/28/17 0413  NA 133* 135 136  K 4.8 3.7 3.9  CL 97* 97* 97*  CO2 20* 22 22  GLUCOSE 130* 101* 91  BUN 40* 27* 38*  CREATININE 5.70* 3.87* 5.29*  CALCIUM 8.4* 8.2* 8.6*  PHOS 6.1* 4.6 5.1*   Recent Labs  Lab 08/25/17 0412 08/26/17 0404 08/28/17 0413  ALBUMIN 2.3* 2.1* 2.1*    Recent Labs  Lab 08/23/17 1228 08/24/17 1400  08/25/17 0412 08/26/17 0404 08/28/17 0414  WBC 13.8* 14.6*  --  21.0* 24.8* 13.3*  NEUTROABS 11.2*  --   --   --   --   --   HGB 7.1* 6.1*   < > 8.3* 8.6* 8.9*  HCT 21.6* 18.6*   < > 25.6* 26.4* 28.1*  MCV 99.1 100.5*  --  94.5 95.7 98.9  PLT 449* 407*  --  457* 497* 471*   < > = values in this interval not displayed.    Recent Labs  Lab 08/27/17 1553 08/27/17 1952 08/28/17 0006 08/28/17 0404 08/28/17 0738  GLUCAP 101* 80 79 92 95    Lab Results  Component Value Date   INR 1.02 08/23/2017   INR 1.05 08/11/2017   INR 1.03 07/07/2016   Studies/Results: Dg Chest Port 1 View  Result Date: 08/27/2017 CLINICAL DATA:  Abnormal respiration EXAM: PORTABLE CHEST 1 VIEW COMPARISON:  Yesterday  FINDINGS: Extubation. Stable left IJ catheter with tip at the upper cavoatrial junction. Streaky opacities at the bases compatible with atelectasis. No edema, effusion, or pneumothorax. Cardiomegaly. Atherosclerosis. IMPRESSION: 1. Stable inflation after extubation. 2. Lower lobe atelectasis. Electronically Signed   By: Monte Fantasia M.D.   On: 08/27/2017 08:11   Medications: . sodium chloride 10 mL/hr at 08/27/17 0135  . sodium chloride    . fentaNYL infusion INTRAVENOUS Stopped (08/24/17 0800)  . levETIRAcetam Stopped (08/28/17 0435)  . niCARDipine Stopped (08/24/17 1530)  . propofol (DIPRIVAN) infusion Stopped (08/25/17 0720)   . chlorhexidine gluconate (MEDLINE KIT)  15 mL Mouth Rinse BID  . Chlorhexidine Gluconate Cloth  6 each Topical Q0600  . cloNIDine  0.2 mg Oral BID  . darbepoetin (ARANESP) injection - DIALYSIS  150 mcg Intravenous Q Thu-HD  . ferric citrate  420 mg Oral TID WC  . gabapentin  100 mg Oral QHS  . hydrALAZINE  25 mg Oral 2 times per day on Sun Tue Thu Sat  . insulin aspart  2-6 Units Subcutaneous Q4H  . mouth  rinse  15 mL Mouth Rinse BID  . mometasone-formoterol  2 puff Inhalation BID  . pantoprazole (PROTONIX) IV  40 mg Intravenous Daily   Dialysis Orders: [facility]   4h    66.5kg   3K/ 2.25    Profile 4    LUA AVG    Hep 2500 , Will not longer receive heparin post SDH 1000 midrun Venofer 37m IV qwk - last 1/11 Mircera 351m IV q2wks - last 1/09  Assessment:  1. PVD s/p right fem pop bypass with right great toe amp 1/16. Groin wound with eschar and some foul smelling drainage. VVS to see again today.  2. S/p fall, traumatic SDH with midline shift s/p  [facility]. Stable neuro now. Still NPO. 3. [place] 1[place]31 with 4 liters off. Still 4 kg over EDW but no resp compromise and labs OK. Plan for next HD today, will get back on schedule on Monday. No heparin  with HD for foreseeable future.  4. Anemia - Transfused 2U 1/29. Aranesp 150 with HD 1/30. Fe 31 TSat 22 Ferritin 1800. 5 dose load only  5. Secondary hyperparathyroidism - no binders still NPO phos fine right now 6. Hypertension. Has only been getting prn BP meds. On rehab svce was on amlodipine 5/isosorbide and had been getting hydralazine but was on hold at time of recent events. Systolic >2>829Has clonidine and hydralazine ordered -   asked to give dose of clonidine now. Will sort out BP med regimen once back on po 7. Nutrition - Supplements when po 8. DM primary service 9. N/ V - Issue last week. It resolved off of binders. Resume velphoro when taking po again. (don't give phoslo as caused N/V).    CyJamal MaesMD [facility] [phone] 08/28/2017, 8:09 AM           ]

## 2017-08-28 NOTE — Progress Notes (Deleted)
Patient had a change in mental status after moving her from chair back to bed.  She became very tearful and asking for a Dr Cage, yelling out for a Dr by this name. There is no Dr by this name.  Contacted Dr Trenton Gammon to make him aware.  Per Dr Trenton Gammon ok to send patient to Dialysis.

## 2017-08-28 NOTE — Progress Notes (Signed)
Patient ID: Destiny Day, female   DOB: 11-29-1950, 67 y.o.   MRN: [id number] Seems to be doing well, no real headache, OOB in chair, MAE x4, dressing dry, awake alert

## 2017-08-29 LAB — GLUCOSE, CAPILLARY
Glucose-Capillary: 114 mg/dL — ABNORMAL HIGH (ref 65–99)
Glucose-Capillary: 131 mg/dL — ABNORMAL HIGH (ref 65–99)
Glucose-Capillary: 75 mg/dL (ref 65–99)
Glucose-Capillary: 89 mg/dL (ref 65–99)
Glucose-Capillary: 91 mg/dL (ref 65–99)
Glucose-Capillary: 92 mg/dL (ref 65–99)
Glucose-Capillary: 93 mg/dL (ref 65–99)

## 2017-08-29 LAB — RENAL FUNCTION PANEL
Albumin: 2.1 g/dL — ABNORMAL LOW (ref 3.5–5.0)
Anion gap: 14 (ref 5–15)
BUN: 17 mg/dL (ref 6–20)
CO2: 26 mmol/L (ref 22–32)
Calcium: 8.5 mg/dL — ABNORMAL LOW (ref 8.9–10.3)
Chloride: 96 mmol/L — ABNORMAL LOW (ref 101–111)
Creatinine, Ser: 3.22 mg/dL — ABNORMAL HIGH (ref 0.44–1.00)
GFR calc Af Amer: 16 mL/min — ABNORMAL LOW (ref >60)
GFR calc non Af Amer: 14 mL/min — ABNORMAL LOW (ref >60)
Glucose, Bld: 95 mg/dL (ref 65–99)
Phosphorus: 4.1 mg/dL (ref 2.5–4.6)
Potassium: 3.5 mmol/L (ref 3.5–5.1)
Sodium: 136 mmol/L (ref 135–145)

## 2017-08-29 NOTE — Progress Notes (Signed)
[facility] Progress Note    Subjective:   No one came in to look at R groin drainage and husband upset Had asked RN yesterday to ask [facility] to come by (concerned about groin wound drainage, smell) He says mentally she has been "in and out" Also appears no hospitalists involved either  Had HD yesterday  Objective Vitals:   08/29/17 0300 08/29/17 0345 08/29/17 0400 08/29/17 0800  BP: 128/63  136/61 131/88  Pulse: 79  81 79  Resp: '18  17 14  ' Temp:  98.1 F (36.7 C) 98.4 F (36.9 C) 98.2 F (36.8 C)  TempSrc:  Oral Oral Axillary  SpO2: 94%  100% 98%  Weight:      Height:       Physical Exam Awakened from sleep Slow to respond but appropriate Oriented to person, place, day, time and Superbowl Sunday Bulky dressing from craniotomy  L IJ central line Lungs ant clear S1S2 No S3 Abd soft not tender No edema  SCD's in place  R great toe amp site clean and healed R groin wound fmelling exudate, eschar "floating" with drainage from underneath Dialysis Access: left upper AVG + bruit   Recent Labs  Lab 08/27/17 0400 08/28/17 0413 08/29/17 0326  NA 135 136 136  K 3.7 3.9 3.5  CL 97* 97* 96*  CO2 '22 22 26  ' GLUCOSE 101* 91 95  BUN 27* 38* 17  CREATININE 3.87* 5.29* 3.22*  CALCIUM 8.2* 8.6* 8.5*  PHOS 4.6 5.1* 4.1   Recent Labs  Lab 08/26/17 0404 08/28/17 0413 08/29/17 0326  ALBUMIN 2.1* 2.1* 2.1*    Recent Labs  Lab 08/23/17 1228 08/24/17 1400  08/25/17 0412 08/26/17 0404 08/28/17 0414  WBC 13.8* 14.6*  --  21.0* 24.8* 13.3*  NEUTROABS 11.2*  --   --   --   --   --   HGB 7.1* 6.1*   < > 8.3* 8.6* 8.9*  HCT 21.6* 18.6*   < > 25.6* 26.4* 28.1*  MCV 99.1 100.5*  --  94.5 95.7 98.9  PLT 449* 407*  --  457* 497* 471*   < > = values in this interval not displayed.    Recent Labs  Lab 08/28/17 1202 08/28/17 2004 08/29/17 0004 08/29/17 0337 08/29/17 0808  GLUCAP 120* 111* 93 91 89    Medications: . sodium chloride 10 mL/hr at 08/27/17  0135  . fentaNYL infusion INTRAVENOUS Stopped (08/24/17 0800)  . [START ON 08/30/2017] ferric gluconate (FERRLECIT/NULECIT) IV    . levETIRAcetam Stopped (08/29/17 0214)  . niCARDipine Stopped (08/24/17 1530)  . propofol (DIPRIVAN) infusion Stopped (08/25/17 0720)   . chlorhexidine gluconate (MEDLINE KIT)  15 mL Mouth Rinse BID  . Chlorhexidine Gluconate Cloth  6 each Topical Q0600  . cloNIDine  0.2 mg Oral BID  . darbepoetin (ARANESP) injection - DIALYSIS  150 mcg Intravenous Q Thu-HD  . ferric citrate  420 mg Oral TID WC  . gabapentin  100 mg Oral QHS  . hydrALAZINE  25 mg Oral 2 times per day on Sun Tue Thu Sat  . insulin aspart  2-6 Units Subcutaneous Q4H  . mouth rinse  15 mL Mouth Rinse BID  . mometasone-formoterol  2 puff Inhalation BID  . pantoprazole (PROTONIX) IV  40 mg Intravenous Daily   Dialysis Orders: [facility]   4h    66.5kg   3K/ 2.25    Profile 4    LUA AVG    Hep 2500 , Will  not longer receive heparin post SDH 1000 midrun Venofer 3m IV qwk - last 1/11 Mircera 359m IV q2wks - last 1/09  Assessment:  1. PVD s/p right fem pop bypass with right great toe amp 1/16. Groin wound with eschar and some foul smelling drainage. [facility] did not see yesterday and will be contacted to look at wound 2. S/p fall, traumatic SDH with midline shift s/p  [facility]. Stable neuro now. Still NPO. For swallow study 3. [place] (weight to 70.8 still over outpt dry has ben as low as 68.5 past week) and will have tomorrow to get back onto outpt schedule. No heparin with HD for foreseeable future. 4K bath pending labs 4. Anemia - Transfused 2U 1/29. Aranesp 150 with HD 1/30. Fe 31 TSat 22 Ferritin 1800. 5 dose load only  5. Secondary hyperparathyroidism - no binders still NPO phos fine right now 6. Hypertension. Getting BP meds again now. Hydralazine/clonidine (was on amlodipine on the Rehab  service, no need to re-add at this time) 7. Nutrition - Supplements when po 8. DM not sure who is managing. There is no hospitalist involved in her care...Marland KitchenMarland Kitchenood sugars look OK 9. N/ V - Issue last week. It resolved off of binders. Resume velphoro when taking po again. (don't give phoslo as caused N/V).    CyJamal MaesMD [facility] [phone] 08/29/2017, 9:25 AM           ]

## 2017-08-30 ENCOUNTER — Encounter (HOSPITAL_COMMUNITY): Payer: Self-pay

## 2017-08-30 ENCOUNTER — Inpatient Hospital Stay (HOSPITAL_COMMUNITY)
Admission: RE | Admit: 2017-08-30 | Discharge: 2017-09-16 | DRG: 945 | Disposition: A | Discharge status: Home Health | Payer: Medicare Other | Source: Intra-hospital | Attending: Physical Medicine & Rehabilitation | Admitting: Physical Medicine & Rehabilitation

## 2017-08-30 DIAGNOSIS — I10 Essential (primary) hypertension: Secondary | ICD-10-CM | POA: Diagnosis not present

## 2017-08-30 DIAGNOSIS — Z959 Presence of cardiac and vascular implant and graft, unspecified: Secondary | ICD-10-CM | POA: Diagnosis not present

## 2017-08-30 DIAGNOSIS — E1122 Type 2 diabetes mellitus with diabetic chronic kidney disease: Secondary | ICD-10-CM | POA: Diagnosis not present

## 2017-08-30 DIAGNOSIS — K921 Melena: Secondary | ICD-10-CM | POA: Diagnosis not present

## 2017-08-30 DIAGNOSIS — E1151 Type 2 diabetes mellitus with diabetic peripheral angiopathy without gangrene: Secondary | ICD-10-CM | POA: Diagnosis not present

## 2017-08-30 DIAGNOSIS — R7309 Other abnormal glucose: Secondary | ICD-10-CM | POA: Diagnosis not present

## 2017-08-30 DIAGNOSIS — Z89432 Acquired absence of left foot: Secondary | ICD-10-CM | POA: Diagnosis not present

## 2017-08-30 DIAGNOSIS — N186 End stage renal disease: Secondary | ICD-10-CM | POA: Diagnosis not present

## 2017-08-30 DIAGNOSIS — B999 Unspecified infectious disease: Secondary | ICD-10-CM

## 2017-08-30 DIAGNOSIS — D631 Anemia in chronic kidney disease: Secondary | ICD-10-CM | POA: Diagnosis not present

## 2017-08-30 DIAGNOSIS — Z9109 Other allergy status, other than to drugs and biological substances: Secondary | ICD-10-CM

## 2017-08-30 DIAGNOSIS — Z833 Family history of diabetes mellitus: Secondary | ICD-10-CM | POA: Diagnosis not present

## 2017-08-30 DIAGNOSIS — D72829 Elevated white blood cell count, unspecified: Secondary | ICD-10-CM | POA: Diagnosis not present

## 2017-08-30 DIAGNOSIS — Z87820 Personal history of traumatic brain injury: Secondary | ICD-10-CM

## 2017-08-30 DIAGNOSIS — J449 Chronic obstructive pulmonary disease, unspecified: Secondary | ICD-10-CM | POA: Diagnosis present

## 2017-08-30 DIAGNOSIS — F419 Anxiety disorder, unspecified: Secondary | ICD-10-CM | POA: Diagnosis present

## 2017-08-30 DIAGNOSIS — I132 Hypertensive heart and chronic kidney disease with heart failure and with stage 5 chronic kidney disease, or end stage renal disease: Secondary | ICD-10-CM | POA: Diagnosis present

## 2017-08-30 DIAGNOSIS — E1142 Type 2 diabetes mellitus with diabetic polyneuropathy: Secondary | ICD-10-CM

## 2017-08-30 DIAGNOSIS — Z89411 Acquired absence of right great toe: Secondary | ICD-10-CM

## 2017-08-30 DIAGNOSIS — R531 Weakness: Secondary | ICD-10-CM | POA: Diagnosis not present

## 2017-08-30 DIAGNOSIS — S065X9D Traumatic subdural hemorrhage with loss of consciousness of unspecified duration, subsequent encounter: Secondary | ICD-10-CM | POA: Diagnosis not present

## 2017-08-30 DIAGNOSIS — D638 Anemia in other chronic diseases classified elsewhere: Secondary | ICD-10-CM

## 2017-08-30 DIAGNOSIS — E119 Type 2 diabetes mellitus without complications: Secondary | ICD-10-CM

## 2017-08-30 DIAGNOSIS — D62 Acute posthemorrhagic anemia: Secondary | ICD-10-CM

## 2017-08-30 DIAGNOSIS — Z79899 Other long term (current) drug therapy: Secondary | ICD-10-CM

## 2017-08-30 DIAGNOSIS — G4730 Sleep apnea, unspecified: Secondary | ICD-10-CM | POA: Diagnosis present

## 2017-08-30 DIAGNOSIS — Z992 Dependence on renal dialysis: Secondary | ICD-10-CM

## 2017-08-30 DIAGNOSIS — Z794 Long term (current) use of insulin: Secondary | ICD-10-CM

## 2017-08-30 DIAGNOSIS — Z7982 Long term (current) use of aspirin: Secondary | ICD-10-CM | POA: Diagnosis not present

## 2017-08-30 DIAGNOSIS — S065X9A Traumatic subdural hemorrhage with loss of consciousness of unspecified duration, initial encounter: Secondary | ICD-10-CM | POA: Diagnosis present

## 2017-08-30 DIAGNOSIS — S065X0D Traumatic subdural hemorrhage without loss of consciousness, subsequent encounter: Secondary | ICD-10-CM | POA: Diagnosis not present

## 2017-08-30 DIAGNOSIS — I739 Peripheral vascular disease, unspecified: Secondary | ICD-10-CM

## 2017-08-30 DIAGNOSIS — S065X0S Traumatic subdural hemorrhage without loss of consciousness, sequela: Secondary | ICD-10-CM | POA: Diagnosis not present

## 2017-08-30 DIAGNOSIS — I5032 Chronic diastolic (congestive) heart failure: Secondary | ICD-10-CM | POA: Diagnosis present

## 2017-08-30 DIAGNOSIS — I120 Hypertensive chronic kidney disease with stage 5 chronic kidney disease or end stage renal disease: Secondary | ICD-10-CM | POA: Diagnosis not present

## 2017-08-30 DIAGNOSIS — I6200 Nontraumatic subdural hemorrhage, unspecified: Secondary | ICD-10-CM | POA: Diagnosis not present

## 2017-08-30 DIAGNOSIS — E1121 Type 2 diabetes mellitus with diabetic nephropathy: Secondary | ICD-10-CM | POA: Diagnosis present

## 2017-08-30 DIAGNOSIS — S065XAA Traumatic subdural hemorrhage with loss of consciousness status unknown, initial encounter: Secondary | ICD-10-CM | POA: Diagnosis present

## 2017-08-30 DIAGNOSIS — R0989 Other specified symptoms and signs involving the circulatory and respiratory systems: Secondary | ICD-10-CM | POA: Diagnosis not present

## 2017-08-30 DIAGNOSIS — Z87891 Personal history of nicotine dependence: Secondary | ICD-10-CM | POA: Diagnosis not present

## 2017-08-30 DIAGNOSIS — I169 Hypertensive crisis, unspecified: Secondary | ICD-10-CM | POA: Diagnosis not present

## 2017-08-30 DIAGNOSIS — N2581 Secondary hyperparathyroidism of renal origin: Secondary | ICD-10-CM | POA: Diagnosis not present

## 2017-08-30 DIAGNOSIS — Z298 Encounter for other specified prophylactic measures: Secondary | ICD-10-CM

## 2017-08-30 DIAGNOSIS — R29810 Facial weakness: Secondary | ICD-10-CM | POA: Diagnosis not present

## 2017-08-30 DIAGNOSIS — E785 Hyperlipidemia, unspecified: Secondary | ICD-10-CM | POA: Diagnosis present

## 2017-08-30 DIAGNOSIS — S065X1S Traumatic subdural hemorrhage with loss of consciousness of 30 minutes or less, sequela: Secondary | ICD-10-CM | POA: Diagnosis not present

## 2017-08-30 LAB — GLUCOSE, CAPILLARY
Glucose-Capillary: 88 mg/dL (ref 65–99)
Glucose-Capillary: 68 mg/dL (ref 65–99)
Glucose-Capillary: 77 mg/dL (ref 65–99)
Glucose-Capillary: 107 mg/dL — ABNORMAL HIGH (ref 65–99)
Glucose-Capillary: 182 mg/dL — ABNORMAL HIGH (ref 65–99)

## 2017-08-30 LAB — CBC
HCT: 32.4 % — ABNORMAL LOW (ref 36.0–46.0)
Hemoglobin: 10.3 g/dL — ABNORMAL LOW (ref 12.0–15.0)
MCH: 32 pg (ref 26.0–34.0)
MCHC: 31.8 g/dL (ref 30.0–36.0)
MCV: 100.6 fL — ABNORMAL HIGH (ref 78.0–100.0)
Platelets: 428 10*3/uL — ABNORMAL HIGH (ref 150–400)
RBC: 3.22 MIL/uL — ABNORMAL LOW (ref 3.87–5.11)
RDW: 17 % — ABNORMAL HIGH (ref 11.5–15.5)
WBC: 15.3 10*3/uL — ABNORMAL HIGH (ref 4.0–10.5)

## 2017-08-30 LAB — RENAL FUNCTION PANEL
Albumin: 2.4 g/dL — ABNORMAL LOW (ref 3.5–5.0)
Anion gap: 16 — ABNORMAL HIGH (ref 5–15)
BUN: 5 mg/dL — ABNORMAL LOW (ref 6–20)
CO2: 25 mmol/L (ref 22–32)
Calcium: 8.5 mg/dL — ABNORMAL LOW (ref 8.9–10.3)
Chloride: 96 mmol/L — ABNORMAL LOW (ref 101–111)
Creatinine, Ser: 1.78 mg/dL — ABNORMAL HIGH (ref 0.44–1.00)
GFR calc Af Amer: 33 mL/min — ABNORMAL LOW (ref >60)
GFR calc non Af Amer: 29 mL/min — ABNORMAL LOW (ref >60)
Glucose, Bld: 84 mg/dL (ref 65–99)
Phosphorus: 2.2 mg/dL — ABNORMAL LOW (ref 2.5–4.6)
Potassium: 3.8 mmol/L (ref 3.5–5.1)
Sodium: 137 mmol/L (ref 135–145)

## 2017-08-30 MED ORDER — SIMETHICONE 80 MG PO CHEW: 80.0000 mg | CHEWABLE_TABLET | Freq: Four times a day (QID) | ORAL | Status: DC | PRN

## 2017-08-30 MED ORDER — ACETAMINOPHEN 325 MG PO TABS
325.0000 mg | ORAL_TABLET | ORAL | Status: DC | PRN
  Administered 2017-08-30 – 2017-09-05 (×4): 650 mg via ORAL
  Administered 2017-09-06: 325 mg via ORAL
  Administered 2017-09-07 – 2017-09-08 (×2): 650 mg via ORAL
  Filled 2017-08-30 (×6): qty 2

## 2017-08-30 MED ORDER — FLUTICASONE PROPIONATE 50 MCG/ACT NA SUSP
1.0000 | Freq: Every day | NASAL | Status: DC | PRN
  Administered 2017-09-03 – 2017-09-08 (×4): 1 via NASAL
  Filled 2017-08-30: qty 16

## 2017-08-30 MED ORDER — CLONIDINE HCL 0.1 MG PO TABS
0.1000 mg | ORAL_TABLET | ORAL | Status: DC | PRN
  Filled 2017-08-30: qty 1

## 2017-08-30 MED ORDER — ALBUTEROL SULFATE (2.5 MG/3ML) 0.083% IN NEBU: 2.5000 mg | INHALATION_SOLUTION | RESPIRATORY_TRACT | Status: DC | PRN

## 2017-08-30 MED ORDER — POLYETHYLENE GLYCOL 3350 17 G PO PACK: 17.0000 g | PACK | Freq: Every day | ORAL | Status: DC | PRN

## 2017-08-30 MED ORDER — CLONIDINE HCL 0.2 MG PO TABS
0.2000 mg | ORAL_TABLET | Freq: Two times a day (BID) | ORAL | Status: DC
  Administered 2017-08-30 – 2017-09-11 (×19): 0.2 mg via ORAL
  Filled 2017-08-30 (×21): qty 1

## 2017-08-30 MED ORDER — NALOXONE HCL 0.4 MG/ML IJ SOLN: 0.0800 mg | INTRAMUSCULAR | Status: DC | PRN

## 2017-08-30 MED ORDER — PRO-STAT SUGAR FREE PO LIQD
30.0000 mL | Freq: Two times a day (BID) | ORAL | Status: DC
  Administered 2017-08-30 – 2017-09-16 (×32): 30 mL via ORAL
  Filled 2017-08-30 (×34): qty 30

## 2017-08-30 MED ORDER — DARBEPOETIN ALFA 150 MCG/0.3ML IJ SOSY
150.0000 ug | PREFILLED_SYRINGE | INTRAMUSCULAR | Status: DC
Start: 2017-09-02 — End: 2017-09-02
  Filled 2017-08-30: qty 0.3

## 2017-08-30 MED ORDER — HYDROCODONE-ACETAMINOPHEN 5-325 MG PO TABS
ORAL_TABLET | ORAL | Status: AC
  Filled 2017-08-30: qty 1

## 2017-08-30 MED ORDER — DIPHENHYDRAMINE HCL 12.5 MG/5ML PO ELIX: 12.5000 mg | ORAL_SOLUTION | Freq: Four times a day (QID) | ORAL | Status: DC | PRN

## 2017-08-30 MED ORDER — MOMETASONE FURO-FORMOTEROL FUM 200-5 MCG/ACT IN AERO
2.0000 | INHALATION_SPRAY | Freq: Two times a day (BID) | RESPIRATORY_TRACT | Status: DC
  Administered 2017-08-31 – 2017-09-16 (×28): 2 via RESPIRATORY_TRACT
  Filled 2017-08-30: qty 8.8

## 2017-08-30 MED ORDER — CHLORHEXIDINE GLUCONATE 0.12% ORAL RINSE (MEDLINE KIT): 15.0000 mL | Freq: Two times a day (BID) | OROMUCOSAL | Status: DC

## 2017-08-30 MED ORDER — PROCHLORPERAZINE MALEATE 5 MG PO TABS: 5.0000 mg | ORAL_TABLET | Freq: Four times a day (QID) | ORAL | Status: DC | PRN

## 2017-08-30 MED ORDER — FLEET ENEMA 7-19 GM/118ML RE ENEM: 1.0000 | ENEMA | Freq: Once | RECTAL | Status: DC | PRN

## 2017-08-30 MED ORDER — HYDROCERIN EX CREA
TOPICAL_CREAM | Freq: Two times a day (BID) | CUTANEOUS | Status: DC
  Administered 2017-08-31 – 2017-09-15 (×30): via TOPICAL
  Filled 2017-08-30: qty 113

## 2017-08-30 MED ORDER — FERRIC CITRATE 1 GM 210 MG(FE) PO TABS
420.0000 mg | ORAL_TABLET | Freq: Three times a day (TID) | ORAL | Status: DC
  Filled 2017-08-30 (×12): qty 2

## 2017-08-30 MED ORDER — PANTOPRAZOLE SODIUM 40 MG IV SOLR
40.0000 mg | Freq: Every day | INTRAVENOUS | Status: DC
  Administered 2017-08-31 – 2017-09-01 (×2): 40 mg via INTRAVENOUS
  Filled 2017-08-30 (×3): qty 40

## 2017-08-30 MED ORDER — TRAZODONE HCL 50 MG PO TABS
25.0000 mg | ORAL_TABLET | Freq: Every evening | ORAL | Status: DC | PRN
  Administered 2017-09-08: 50 mg via ORAL
  Filled 2017-08-30 (×2): qty 1

## 2017-08-30 MED ORDER — PROCHLORPERAZINE 25 MG RE SUPP: 12.5000 mg | Freq: Four times a day (QID) | RECTAL | Status: DC | PRN

## 2017-08-30 MED ORDER — ORAL CARE MOUTH RINSE
15.0000 mL | Freq: Two times a day (BID) | OROMUCOSAL | Status: DC
  Administered 2017-08-31 – 2017-09-16 (×18): 15 mL via OROMUCOSAL

## 2017-08-30 MED ORDER — PROCHLORPERAZINE EDISYLATE 5 MG/ML IJ SOLN: 5.0000 mg | Freq: Four times a day (QID) | INTRAMUSCULAR | Status: DC | PRN

## 2017-08-30 MED ORDER — GABAPENTIN 100 MG PO CAPS
100.0000 mg | ORAL_CAPSULE | Freq: Every day | ORAL | Status: DC
  Administered 2017-08-30 – 2017-09-15 (×17): 100 mg via ORAL
  Filled 2017-08-30 (×17): qty 1

## 2017-08-30 MED ORDER — HYDROCODONE-ACETAMINOPHEN 5-325 MG PO TABS
1.0000 | ORAL_TABLET | ORAL | Status: DC | PRN
  Administered 2017-08-30 – 2017-09-15 (×7): 1 via ORAL
  Filled 2017-08-30 (×5): qty 1

## 2017-08-30 MED ORDER — GUAIFENESIN-DM 100-10 MG/5ML PO SYRP: 5.0000 mL | ORAL_SOLUTION | Freq: Four times a day (QID) | ORAL | Status: DC | PRN

## 2017-08-30 MED ORDER — BISACODYL 10 MG RE SUPP: 10.0000 mg | Freq: Every day | RECTAL | Status: DC | PRN

## 2017-08-30 MED ORDER — ALUMINUM HYDROXIDE GEL 320 MG/5ML PO SUSP
10.0000 mL | ORAL | Status: DC | PRN
  Filled 2017-08-30: qty 30

## 2017-08-30 MED ORDER — SODIUM CHLORIDE 0.9 % IV SOLN
125.0000 mg | INTRAVENOUS | Status: AC
  Administered 2017-09-01 – 2017-09-06 (×3): 125 mg via INTRAVENOUS
  Filled 2017-08-30 (×7): qty 10

## 2017-08-30 MED ORDER — POLYETHYLENE GLYCOL 3350 17 G PO PACK
17.0000 g | PACK | Freq: Every day | ORAL | Status: DC
  Administered 2017-08-30 – 2017-09-15 (×8): 17 g via ORAL
  Filled 2017-08-30 (×17): qty 1

## 2017-08-30 MED ORDER — HYDRALAZINE HCL 25 MG PO TABS
25.0000 mg | ORAL_TABLET | ORAL | Status: DC
  Administered 2017-08-31 – 2017-09-04 (×6): 25 mg via ORAL
  Filled 2017-08-30 (×6): qty 1

## 2017-08-30 NOTE — H&P (Addendum)
Physical Medicine and Rehabilitation Admission H&P   CC:  TBI with functional deficits.   HPI:  Destiny Day a 67 y.o.femalewith history of ESRD- HD, diastolic congestive heart failure, diabetes mellitus, HTN, PVD s/p 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 History taken from chart review and husband. She was admitted on 08/11/17 with ischemic right foot with gangrenous changes and underwent right femoral to below knee BG and right great toe amputation by Dr. Donnetta Hutching.  Post op WBAT and was admitted to rehab 08/16/2017 for inpatient therapies to consist of PT and OT at least three hours five days a week.   She was medically stable and right great toe amputation site as well as RLE bypass incisions were healing well. Reactive leucocytosis was resolving and ABLA was stable. She was progressing to modified independent level but on 08/23/17 she sustained a fall. CT ordered and reviewed, showing left SDH.  Per report, large left SDH.  She had a decline in MS. She was taken to OR emergently for left craniotomy the same day by Dr. Christella Noa. She developed recurrent acute SDH requiring surgical evacuation on 01/30. hospital course significant for labile BP requiring Cardene drip, hyponatremia as well as VDRF. She tolerated extubation on 01/31 and mentation improving with occasional bouts of confusion. Dr. Donnetta Hutching following for input and right groin wound noted to have fat necrosis which was debrided at bedside today with recommendations for wet to dry dressing changes. Therapy evaluations done and CIR recommended due to generalized weakness, decreased balance as well as issues with delayed processing.     Review of Systems  Constitutional: Negative for chills, fever and malaise/fatigue.  HENT: Negative for hearing loss and tinnitus.   Eyes: Negative for blurred vision and double vision.  Respiratory: Negative for cough and shortness of breath.   Cardiovascular: Negative for chest pain and  palpitations.  Gastrointestinal: Negative for constipation, heartburn and nausea.  Musculoskeletal: Negative for back pain and myalgias.  Neurological: Positive for headaches. Negative for dizziness and speech change.  Psychiatric/Behavioral: Negative for depression. The patient is not nervous/anxious.   All other systems reviewed and are negative.    Past Medical History:  Diagnosis Date  . Abdominal bruit   . Anemia   . Anxiety   . Arthritis    Osteoarthritis  . Asthma   . Cervical disc disease    "pinced nerve"  . CHF (congestive heart failure) ([place])   . Complication of anesthesia    " after I got home from my last procedure, I started itching."  . Diabetes mellitus    Type II  . Diverticulitis   . ESRD on peritoneal dialysis [facility])    Hemodialysis - MWF- [place]   . GERD (gastroesophageal reflux disease)    from medications  . GI bleed 03/31/2013  . History of hiatal hernia   . Hyperlipidemia   . Hypertension   . Neuropathy    left leg  . Osteoporosis   . Peripheral vascular disease ([place])   . Pneumonia    "very young"  . Seasonal allergies   . Shortness of breath dyspnea    WIth exertion  . Sleep apnea    can't afford cpap    Past Surgical History:  Procedure Laterality Date  . A/V SHUNTOGRAM N/A 09/22/2016   Procedure: A/V Shuntogram - left arm;  Surgeon: Serafina Mitchell, MD;  Location: [place] [facility];  Service: Cardiovascular;  Laterality: N/A;  . ABDOMINAL HYSTERECTOMY  1993`  . AMPUTATION  Left 09/01/2016   Procedure: LEFT FOOT TRANSMETATARSAL AMPUTATION;  Surgeon: Newt Minion, MD;  Location: [place];  Service: Orthopedics;  Laterality: Left;  . AMPUTATION Right 08/11/2017   Procedure: RIGHT GREAT TOE AMPUTATION DIGIT;  Surgeon: Rosetta Posner, MD;  Location: [facility] OR;  Service: Vascular;  Laterality: Right;  . AV FISTULA PLACEMENT Left 04/21/2016   Procedure: INSERTION OF ARTERIOVENOUS (AV) GORE-TEX GRAFT ARM LEFT;  Surgeon: Elam Dutch, MD;  Location: [place];  Service: Vascular;  Laterality: Left;  . [place] TRANSPOSITION Left 07/10/2014   Procedure: BASCILIC VEIN TRANSPOSITION;  Surgeon: Angelia Mould, MD;  Location: [place];  Service: Vascular;  Laterality: Left;  . BASCILIC VEIN TRANSPOSITION Right 11/08/2014   Procedure: FIRST STAGE BASILIC VEIN TRANSPOSITION;  Surgeon: Angelia Mould, MD;  Location: [place];  Service: Vascular;  Laterality: Right;  . BASCILIC VEIN TRANSPOSITION Right 01/18/2015   Procedure: SECOND STAGE BASILIC VEIN TRANSPOSITION;  Surgeon: Angelia Mould, MD;  Location: [place];  Service: Vascular;  Laterality: Right;  . CRANIOTOMY N/A 08/23/2017   Procedure: CRANIOTOMY HEMATOMA EVACUATION SUBDURAL;  Surgeon: Ashok Pall, MD;  Location: [place];  Service: Neurosurgery;  Laterality: N/A;  . CRANIOTOMY Left 08/24/2017   Procedure: CRANIOTOMY FOR RECURRENT ACUTE SUBDURAL HEMATOMA;  Surgeon: Ashok Pall, MD;  Location: [place];  Service: Neurosurgery;  Laterality: Left;  . ESOPHAGOGASTRODUODENOSCOPY N/A 03/31/2013   Procedure: ESOPHAGOGASTRODUODENOSCOPY (EGD);  Surgeon: Gatha Mayer, MD;  Location: [facility] ENDOSCOPY;  Service: Endoscopy;  Laterality: N/A;  . EYE SURGERY     laser surgery  . FEMORAL-POPLITEAL BYPASS GRAFT Left 07/08/2016   Procedure: LEFT  FEMORAL-BELOW KNEE POPLITEAL ARTERY BYPASS GRAFT USING 6MM X 80 CM PROPATEN GORETEX GRAFT WITH RINGS.;  Surgeon: Rosetta Posner, MD;  Location: [place];  Service: Vascular;  Laterality: Left;  . FEMORAL-POPLITEAL BYPASS GRAFT Right 08/11/2017   Procedure: RIGHT FEMORAL TO BELOW KNEE POPLITEAL ARTERKY  BYPASS GRAFT USING 6MM RINGED PROPATEN GRAFT;  Surgeon: Rosetta Posner, MD;  Location: [place];  Service: Vascular;  Laterality: Right;  . FISTULOGRAM Left 10/29/2014   Procedure: FISTULOGRAM;  Surgeon: Angelia Mould, MD;  Location: [facility] CATH LAB;  Service: Cardiovascular;  Laterality: Left;  . LIGATION OF ARTERIOVENOUS  FISTULA Left 04/21/2016   Procedure: LIGATION OF  ARTERIOVENOUS  FISTULA LEFT ARM;  Surgeon: Elam Dutch, MD;  Location: [place];  Service: Vascular;  Laterality: Left;  . LOWER EXTREMITY ANGIOGRAPHY N/A 04/06/2017   Procedure: Lower Extremity Angiography - Right;  Surgeon: Serafina Mitchell, MD;  Location: [place] [facility];  Service: Cardiovascular;  Laterality: N/A;  . PATCH ANGIOPLASTY Right 01/18/2015   Procedure: BASILIC VEIN PATCH ANGIOPLASTY USING VASCUGUARD PATCH;  Surgeon: Angelia Mould, MD;  Location: [place];  Service: Vascular;  Laterality: Right;  . PERIPHERAL VASCULAR BALLOON ANGIOPLASTY  09/22/2016   Procedure: Peripheral Vascular Balloon Angioplasty;  Surgeon: Serafina Mitchell, MD;  Location: [place] [facility];  Service: Cardiovascular;;  Lt. Fistula  . PERIPHERAL VASCULAR CATHETERIZATION N/A 06/23/2016   Procedure: Abdominal Aortogram w/Lower Extremity;  Surgeon: Serafina Mitchell, MD;  Location: [place] [facility];  Service: Cardiovascular;  Laterality: N/A;  . PERIPHERAL VASCULAR CATHETERIZATION  06/23/2016   Procedure: Peripheral Vascular Intervention;  Surgeon: Serafina Mitchell, MD;  Location: [place] [facility];  Service: Cardiovascular;;  lt common and external illiac artery    Family History  Problem Relation Age of Onset  . Other Mother  not sure of cause of death  . Diabetes Father   . Pancreatic cancer Maternal Grandmother   . Colon cancer Neg Hx     Social History:  Married. Independent PTA. Per reports that she quit smoking about 5 years ago. Her smoking use included cigarettes. She has a 14.00 pack-year smoking history. she has never used smokeless tobacco. She reports that she does not drink alcohol or use drugs.   Allergies  Allergen Reactions  . Lisinopril Cough  . Prednisone Other (See Comments)    Excessive fluid buildup  . Velphoro [Sucroferric Oxyhydroxide] Nausea And Vomiting    Medications Prior to Admission  Medication Sig Dispense Refill  . carvedilol (COREG) 12.5 MG tablet Take  12.5 mg by mouth 2 (two) times daily.     Marland Kitchen acetaminophen (TYLENOL) 500 MG tablet Take 500 mg by mouth every 6 (six) hours as needed.    Marland Kitchen albuterol (PROVENTIL HFA;VENTOLIN HFA) 108 (90 BASE) MCG/ACT inhaler Inhale 1-2 puffs into the lungs every 6 (six) hours as needed for wheezing or shortness of breath.    Marland Kitchen amLODipine (NORVASC) 10 MG tablet Take 10 mg by mouth at bedtime.     Marland Kitchen aspirin 81 MG chewable tablet Chew 81 mg by mouth every evening.     Marland Kitchen atorvastatin (LIPITOR) 20 MG tablet Take 20 mg by mouth at bedtime.     . budesonide-formoterol (SYMBICORT) 160-4.5 MCG/ACT inhaler Inhale 1 puff into the lungs daily as needed (for respiratory issues).     . calcium acetate (PHOSLO) 667 MG capsule Take 667-1,334 mg by mouth See admin instructions. 1 tablet twice daily with snacks. And 2 tablets 3 times daily with meals    . cinacalcet (SENSIPAR) 30 MG tablet Take 30 mg by mouth daily with supper.     . ferric citrate (AURYXIA) 1 GM 210 MG(Fe) tablet Take 420 mg by mouth 3 (three) times daily with meals.    . fluticasone (FLONASE) 50 MCG/ACT nasal spray Place 1 spray into both nostrils daily as needed for allergies or rhinitis.    Marland Kitchen gabapentin (NEURONTIN) 100 MG capsule Take 1 capsule (100 mg total) by mouth at bedtime. When necessary for neuropathy pain 30 capsule 3  . hydrALAZINE (APRESOLINE) 25 MG tablet Take 1 tablet by mouth See admin instructions. Take 1 tab 3 times a day on dialysis days and 1 tab twice daily on non dialysis days    . HYDROcodone-acetaminophen (NORCO) 5-325 MG tablet Take 1 tablet by mouth every 6 (six) hours as needed for moderate pain. 30 tablet 0  . insulin lispro (HUMALOG) 100 UNIT/ML injection Inject 0-8 Units into the skin 3 (three) times daily before meals. 0-59 = hypoglycemic protocol, 60-149 = 0 units, 150-250 = 5 units, 251-300 = 8 units, >301 notify MD/NP    . isosorbide dinitrate (ISORDIL) 20 MG tablet Take 1 tablet by mouth See admin instructions. Take 1 tablet daily  on dialysis days and 1 tab twice daily on non dialysis days    . LEVEMIR FLEXTOUCH 100 UNIT/ML Pen Inject 10 Units into the skin at bedtime.     Marland Kitchen loratadine (CLARITIN) 10 MG tablet Take 10 mg by mouth daily as needed for allergies.    . multivitamin (RENA-VIT) TABS tablet Take 1 tablet by mouth daily.   5  . omeprazole (PRILOSEC) 20 MG capsule Take 20 mg by mouth daily.     . sucroferric oxyhydroxide (VELPHORO) 500 MG chewable tablet Chew 500-1,000 mg by mouth See admin  instructions. Take 2 tablets (1000 mg) by mouth with meals and 1 tablet (500 mg) with snacks    . [DISCONTINUED] clopidogrel (PLAVIX) 75 MG tablet TAKE 1 TABLET EVERY DAY 30 tablet 7  . [DISCONTINUED] diazepam (VALIUM) 5 MG tablet Take 5 mg by mouth every 12 (twelve) hours as needed for anxiety.      Drug Regimen Review  Drug regimen was reviewed and remains appropriate with no significant issues identified  Home: Home Living Family/patient expects to be discharged to:: Inpatient rehab Living Arrangements: Spouse/significant other Available Help at Discharge: Family, Available 24 hours/day(spouse has taken FMLA to assist after d/c this time due to h) Type of Home: House Home Access: Stairs to enter [employer] of Steps: 2 Entrance Stairs-Rails: Left Home Layout: Multi-level, Bed/bath upstairs Alternate Level Stairs-Number of Steps: 6 and 6 split level Alternate Level Stairs-Rails: Left Bathroom Shower/Tub: Tub/shower unit, Architectural technologist: Standard Bathroom Accessibility: Yes Home Equipment: Environmental consultant - 4 wheels, Cane - single point, Civil engineer, contracting, Crutches Additional Comments: 2 rollators at home, walker, SPC  Lives With: Spouse, Other (Comment)   Functional History: Prior Function Level of Independence: Independent with assistive device(s) Comments: walked with cane  Functional Status:  Mobility: Bed Mobility Overal bed mobility: Needs Assistance Bed Mobility: Supine to Sit Supine to sit: Max  assist General bed mobility comments: assist to move LE off bed and to lift trunk from bed  Transfers Overall transfer level: Needs assistance Equipment used: 2 person hand held assist Transfers: Sit to/from Stand, Stand Pivot Transfers Sit to Stand: Max assist, +2 physical assistance Stand pivot transfers: Max assist, +2 physical assistance General transfer comment: assist to power up into standing.  bil. knees blocked to prevent buckling.  Assist provided at bil. ischiums to extend hips.  Assist to pivot feet and swing hips to chair.   Ambulation/Gait General Gait Details: Not attempted more than bed to chair due to nausea and vomiting.    ADL: ADL Overall ADL's : Needs assistance/impaired Eating/Feeding: Maximal assistance, Sitting, Bed level Grooming: Wash/dry hands, Wash/dry face, Oral care, Maximal assistance, Sitting Upper Body Bathing: Total assistance, Sitting Lower Body Bathing: Total assistance, Sit to/from stand Upper Body Dressing : Total assistance, Sitting Lower Body Dressing: Total assistance, Sit to/from stand Toilet Transfer: Maximal assistance, +2 for physical assistance, Stand-pivot, BSC Toileting- Clothing Manipulation and Hygiene: Total assistance, Sit to/from stand Functional mobility during ADLs: Maximal assistance, +2 for physical assistance General ADL Comments: activity limited by lethargy.  She requires a significant amount of time to initiate and execute movement/activity   Cognition: Cognition Overall Cognitive Status: Impaired/Different from baseline Arousal/Alertness: Awake/alert Orientation Level: Oriented to person, Oriented to place, Oriented to situation, Disoriented to time Cognition Arousal/Alertness: Lethargic Behavior During Therapy: Flat affect Overall Cognitive Status: Impaired/Different from baseline Area of Impairment: Attention, Following commands, Safety/judgement, Problem solving Current Attention Level: Sustained, Focused Following  Commands: Follows one step commands inconsistently, Follows one step commands with increased time Problem Solving: Slow processing, Decreased initiation, Difficulty sequencing, Requires verbal cues, Requires tactile cues General Comments: Pt appropriate but lethargic.  Requires increased time to initiate activity.  Moves very slowly    Blood pressure (!) 145/72, pulse 78, temperature 98.4 F (36.9 C), temperature source Oral, resp. rate 14, height _0  (1.651 m), weight 69 kg (152 lb 1.9 oz), SpO2 100 %. Physical Exam  Nursing note and vitals reviewed. Constitutional: She appears well-developed and well-nourished. No distress.  HENT:  Head: Normocephalic.  Mouth/Throat: Oropharynx is clear and  moist.  Scalp with bulky dressing and dry drainage on left side.   Eyes: Conjunctivae and EOM are normal. Pupils are equal, round, and reactive to light.  Neck: Normal range of motion. Neck supple.  Cardiovascular: Normal rate, regular rhythm and intact distal pulses.  Respiratory: Effort normal and breath sounds normal. No stridor. No respiratory distress. She has no wheezes.  GI: Soft. Bowel sounds are normal. She exhibits no distension. There is no tenderness.  Musculoskeletal:  Right great toe amputation  Left transmetatarsal   Neurological: She is alert.  Speech clear.  Able to follow simple motor commands without difficulty. A&O2.  RUE with pronator drift.  Motor: RUE/RLE: 4-/5 proximal to distal LUE/LLE: 4+/5 proximal to distal  Skin: Skin is warm and dry. She is not diaphoretic.  Right great toe amputation site dusky  Right 2nd toe with dry and atrophic. Flaky skin right foot. Left transmetatarsal site well healed.  Right groin packed with damp dressing.   Psychiatric: Her speech is delayed. Her speech is not slurred. She is hyperactive. She is attentive.    Results for orders placed or performed during the hospital encounter of 08/23/17 (from the past 48 hour(s))  Glucose,  capillary     Status: Abnormal   Collection Time: 08/28/17  8:04 PM  Result Value Ref Range   Glucose-Capillary 111 (H) 65 - 99 mg/dL  Glucose, capillary     Status: None   Collection Time: 08/29/17 12:04 AM  Result Value Ref Range   Glucose-Capillary 93 65 - 99 mg/dL  Renal function panel     Status: Abnormal   Collection Time: 08/29/17  3:26 AM  Result Value Ref Range   Sodium 136 135 - 145 mmol/L   Potassium 3.5 3.5 - 5.1 mmol/L   Chloride 96 (L) 101 - 111 mmol/L   CO2 26 22 - 32 mmol/L   Glucose, Bld 95 65 - 99 mg/dL   BUN 17 6 - 20 mg/dL   Creatinine, Ser 3.22 (H) 0.44 - 1.00 mg/dL    Comment: DELTA CHECK NOTED   Calcium 8.5 (L) 8.9 - 10.3 mg/dL   Phosphorus 4.1 2.5 - 4.6 mg/dL   Albumin 2.1 (L) 3.5 - 5.0 g/dL   GFR calc non Af Amer 14 (L) >60 mL/min   GFR calc Af Amer 16 (L) >60 mL/min    Comment: (NOTE) The eGFR has been calculated using the CKD EPI equation. This calculation has not been validated in all clinical situations. eGFR's persistently <60 mL/min signify possible Chronic Kidney Disease.    Anion gap 14 5 - 15    Comment: Performed at [facility] [address], [place], [place] [address]  Glucose, capillary     Status: None   Collection Time: 08/29/17  3:37 AM  Result Value Ref Range   Glucose-Capillary 91 65 - 99 mg/dL  Glucose, capillary     Status: None   Collection Time: 08/29/17  8:08 AM  Result Value Ref Range   Glucose-Capillary 89 65 - 99 mg/dL  Glucose, capillary     Status: Abnormal   Collection Time: 08/29/17 12:26 PM  Result Value Ref Range   Glucose-Capillary 114 (H) 65 - 99 mg/dL  Glucose, capillary     Status: Abnormal   Collection Time: 08/29/17  3:51 PM  Result Value Ref Range   Glucose-Capillary 131 (H) 65 - 99 mg/dL  Glucose, capillary     Status: None   Collection Time: 08/29/17  7:31 PM  Result Value Ref Range   Glucose-Capillary 92 65 - 99 mg/dL  Glucose, capillary     Status: None   Collection Time: 08/30/17 12:00 AM   Result Value Ref Range   Glucose-Capillary 75 65 - 99 mg/dL   Comment 1 Notify RN   Glucose, capillary     Status: None   Collection Time: 08/30/17  3:04 AM  Result Value Ref Range   Glucose-Capillary 88 65 - 99 mg/dL  Glucose, capillary     Status: None   Collection Time: 08/30/17 11:49 AM  Result Value Ref Range   Glucose-Capillary 68 65 - 99 mg/dL  CBC     Status: Abnormal   Collection Time: 08/30/17 12:06 PM  Result Value Ref Range   WBC 15.3 (H) 4.0 - 10.5 K/uL   RBC 3.22 (L) 3.87 - 5.11 MIL/uL   Hemoglobin 10.3 (L) 12.0 - 15.0 g/dL   HCT 32.4 (L) 36.0 - 46.0 %   MCV 100.6 (H) 78.0 - 100.0 fL   MCH 32.0 26.0 - 34.0 pg   MCHC 31.8 30.0 - 36.0 g/dL   RDW 17.0 (H) 11.5 - 15.5 %   Platelets 428 (H) 150 - 400 K/uL    Comment: Performed at [facility], [place] [address] [place], [place] [address]  Renal function panel     Status: Abnormal   Collection Time: 08/30/17 12:06 PM  Result Value Ref Range   Sodium 137 135 - 145 mmol/L   Potassium 3.8 3.5 - 5.1 mmol/L   Chloride 96 (L) 101 - 111 mmol/L   CO2 25 22 - 32 mmol/L   Glucose, Bld 84 65 - 99 mg/dL   BUN 5 (L) 6 - 20 mg/dL   Creatinine, Ser 1.78 (H) 0.44 - 1.00 mg/dL   Calcium 8.5 (L) 8.9 - 10.3 mg/dL   Phosphorus 2.2 (L) 2.5 - 4.6 mg/dL   Albumin 2.4 (L) 3.5 - 5.0 g/dL   GFR calc non Af Amer 29 (L) >60 mL/min   GFR calc Af Amer 33 (L) >60 mL/min    Comment: (NOTE) The eGFR has been calculated using the CKD EPI equation. This calculation has not been validated in all clinical situations. eGFR's persistently <60 mL/min signify possible Chronic Kidney Disease.    Anion gap 16 (H) 5 - 15    Comment: Performed at [facility], [place] [address], [place], [place] [address]  Glucose, capillary     Status: None   Collection Time: 08/30/17 12:14 PM  Result Value Ref Range   Glucose-Capillary 77 65 - 99 mg/dL   No results found.     Medical Problem List and Plan: 1.  Generalized weakness, decreased balance  as well as issues with delayed processing secondary to TBI with left SDH. 2.  DVT Prophylaxis/Anticoagulation: Mechanical: Sequential compression devices, below knee Bilateral lower extremities 3. Pain Management: Limit use of hydrocodone as confusion reported.  4. Mood: LCSW to follow for evaluation and support.  5. Neuropsych: This patient is not capable of making decisions on her own behalf. 6. Skin/Wound Care: Monitor wound for healing.  7. Fluids/Electrolytes/Nutrition: Monitor I/O. Renal diet with 1200 FR.  8. ESRD: HD MWF at end of the day to help with tolerance of therapy 9. PAD/Right femoral to popliteal bypass: Wet to dry dressing to right groin wound bid.  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   10 T2DM: Monitor BS ac/hs. Continue to use SSI for elevated BS. Levemir on hold  currently due to poor po intake. Add nephro with meals.  11. HTN: Monitor BP bid--continue hydralazine and catapres 12. Neuropathy RLE: On gabapentin.  13. Acute on chronic anemia: On Nulecit IV with HD for iron loading and aranesp weekly.  14. COPD: Encourage IS with flutter valve. Continue Dulera.  15. Seizure prophylaxis: On keppra tid.   Post Admission Physician Evaluation: 1. Preadmission assessment reviewed and changes made below. 2. Functional deficits secondary  to TBI with left SDH. 3. Patient is admitted to receive collaborative, interdisciplinary care between the physiatrist, rehab nursing staff, and therapy team. 4. Patient's level of medical complexity and substantial therapy needs in context of that medical necessity cannot be provided at a lesser intensity of care such as a SNF. 5. Patient has experienced substantial functional loss from his/her baseline which was documented above under the "Functional History" and "Functional Status" headings.  Judging by the patient's diagnosis, physical exam, and functional history, the patient has potential for functional progress which will result in  measurable gains while on inpatient rehab.  These gains will be of substantial and practical use upon discharge  in facilitating mobility and self-care at the household level. 29. Physiatrist will provide 24 hour management of medical needs as well as oversight of the therapy plan/treatment and provide guidance as appropriate regarding the interaction of the two. 7. 24 hour rehab nursing will assist with bladder management, bowel management, safety, skin/wound care, disease management, medication administration, pain management and patient education  and help integrate therapy concepts, techniques,education, etc. 8. PT will assess and treat for/with: Lower extremity strength, range of motion, stamina, balance, functional mobility, safety, adaptive techniques and equipment, woundcare, coping skills, pain control, TBI education.   Goals are: Min A. 9. OT will assess and treat for/with: ADL's, functional mobility, safety, upper extremity strength, adaptive techniques and equipment, wound mgt, ego support, and community reintegration.   Goals are: Min A. Therapy may not proceed with showering this patient. 10. SLP will assess and treat for/with: cognition.  Goals are: Min A. 11. Case Management and Social Worker will assess and treat for psychological issues and discharge planning. 12. Team conference will be held weekly to assess progress toward goals and to determine barriers to discharge. 13. Patient will receive at least 3 hours of therapy per day at least 5 days per week. 14. ELOS: 19-23 days.       15. Prognosis:  good  Delice Lesch, MD, ABPMR Bary Leriche, PA-C 08/30/2017

## 2017-08-30 NOTE — Progress Notes (Addendum)
Pt. discharged to CIR. VSS. Pt. informed of the plan, educated and all questions answered. All belongings sent with pt. and daughter at bedside upon transfer. Report called to Maudry Mayhew, RN at 1700.

## 2017-08-30 NOTE — Progress Notes (Signed)
Received pt. As a transfer from [place]. Pt. And her daughter were informed about unit routine and protocol.Fall prevention plan was signed by RN and

## 2017-08-30 NOTE — Progress Notes (Signed)
I met with Destiny Day and her spouse at bedside. We discussed readmitting Destiny Day to inpt rehab. He is in agreement. I spoke with Dr. Delice Lesch and he reassessed Destiny Day at bedside and agreed to readmit Destiny Day today. I contacted Dr. Christella Noa by phone and he is in agreement to d/c Destiny Day today. I will make the arrangements to readmit Destiny Day to CIR today. [phone]

## 2017-08-30 NOTE — Progress Notes (Signed)
PT Cancellation Note  Patient Details Name: CLARABELL MATSUOKA MRN: [id number] DOB: [birth year]   Cancelled Treatment:    Reason Eval/Treat Not Completed: Patient at procedure or test/unavailable(In HD.  Will check back later.  )   Denice Paradise 08/30/2017, 9:46 AM  [facility] [phone] (pager)

## 2017-08-30 NOTE — Progress Notes (Signed)
[facility] Progress Note  Subjective: no c/o today  Vitals:   08/29/17 1956 08/29/17 2300 08/30/17 0256 08/30/17 0400  BP:  (!) 158/70 (!) 172/80 133/75  Pulse:      Resp:  15 17   Temp:  98.2 F (36.8 C) 97.7 F (36.5 C)   TempSrc:  Oral Oral   SpO2: 100% 99% 100%   Weight:      Height:        Inpatient medications: . chlorhexidine gluconate (MEDLINE KIT)  15 mL Mouth Rinse BID  . cloNIDine  0.2 mg Oral BID  . darbepoetin (ARANESP) injection - DIALYSIS  150 mcg Intravenous Q Thu-HD  . ferric citrate  420 mg Oral TID WC  . gabapentin  100 mg Oral QHS  . hydrALAZINE  25 mg Oral 2 times per day on Sun Tue Thu Sat  . insulin aspart  2-6 Units Subcutaneous Q4H  . mouth rinse  15 mL Mouth Rinse BID  . mometasone-formoterol  2 puff Inhalation BID  . pantoprazole (PROTONIX) IV  40 mg Intravenous Daily   . sodium chloride 10 mL/hr at 08/27/17 0135  . fentaNYL infusion INTRAVENOUS Stopped (08/24/17 0800)  . ferric gluconate (FERRLECIT/NULECIT) IV    . levETIRAcetam Stopped (08/30/17 0311)  . niCARDipine Stopped (08/24/17 1530)  . propofol (DIPRIVAN) infusion Stopped (08/25/17 0720)   albuterol, alteplase, fluticasone, HYDROcodone-acetaminophen, labetalol, morphine injection, naLOXone (NARCAN)  injection, sodium chloride flush  Exam: Alert, head dressing intact, alert, nad No jvd Chest ctab RRR Abd soft ntnd R grt toe amp R groin wound some drainage noted LUA AVG+bruit  Dialysis: [facility] 4h  66.5kg  3K/2.25 bath  LUA AVG  P4  Hep 2500+ 1000 mid (holding after SDH)  Venofer 69m IV qwk - last 1/11 Mircera 316m IV q2wks - last 1/09       Impression: 1  PAD R fem-pop, ?wound infection 2  SDH sp craniotomy 3  ESRD HD mwf 4  Anemia ckd, darbe 150 here, fe load 5  HTN stable 6  DM bs's good, minimal use of SSI 7  CKD MBD, resume velphoro when possible   Plan - HD today, asked [facility] to see   [facility] Spl[facility]D [facility] pager  [phone] 08/30/2017, 8:41 AM   Recent Labs  Lab 08/27/17 0400 08/28/17 0413 08/29/17 0326  NA 135 136 136  K 3.7 3.9 3.5  CL 97* 97* 96*  CO2 _0 GLUCOSE 101* 91 95  BUN 27* 38* 17  CREATININE 3.87* 5.29* 3.22*  CALCIUM 8.2* 8.6* 8.5*  PHOS 4.6 5.1* 4.1   Recent Labs  Lab 08/26/17 0404 08/28/17 0413 08/29/17 0326  ALBUMIN 2.1* 2.1* 2.1*   Recent Labs  Lab 08/23/17 1228  08/25/17 0412 08/26/17 0404 08/28/17 0414  WBC 13.8*   < > 21.0* 24.8* 13.3*  NEUTROABS 11.2*  --   --   --   --   HGB 7.1*   < > 8.3* 8.6* 8.9*  HCT 21.6*   < > 25.6* 26.4* 28.1*  MCV 99.1   < > 94.5 95.7 98.9  PLT 449*   < > 457* 497* 471*   < > = values in this interval not displayed.   Iron/TIBC/Ferritin/ %Sat    Component Value Date/Time   IRON 31 08/28/2017 0414   TIBC 140 (L) 08/28/2017 0414   FERRITIN 1,854 (H) 08/28/2017 0414   IRONPCTSAT 22 08/28/2017 0414

## 2017-08-30 NOTE — IPOC Note (Signed)
Overall Plan of Care [facility]) Patient Details Name: Destiny Day MRN: [id number] DOB: 1950-07-30  Admitting Diagnosis: TBI with [facility]  Hospital Problems: Active Problems:   Subdural hemorrhage following injury [facility])     Functional Problem List: Nursing Endurance, Safety, Sensory, Bowel, Medication Management, Motor, Skin Integrity, Nutrition, Pain  PT Balance, Endurance, Motor, Safety  OT Balance, Endurance, Motor, Pain, Cognition, Sensory  SLP Cognition, Safety  TR         Basic ADL's: OT Grooming, Bathing, Dressing, Toileting     Advanced  ADL's: OT       Transfers: PT Bed to Chair, Bed Mobility, Car, Chief Operating Officer: PT Emergency planning/management officer, Ambulation, Stairs     Additional Impairments: OT Fuctional Use of Upper Extremity  SLP Swallowing, Social Cognition   Problem Solving, Memory, Attention, Awareness  TR      Anticipated Outcomes Item Anticipated Outcome  Self Feeding mod I  Swallowing  Mod I   Basic self-care  supervision  Toileting  supervision   Bathroom Transfers supervision to toilet  Bowel/Bladder  maintain continence of bowel and bladder while in Rehab with miinimal assistance  Transfers  supervision  Locomotion  supervision w/ RW  Communication     Cognition  Mod I  Pain  pain score < 3 on a pain score 0-10 while in Rehab  Safety/Judgment  Free of Fall or injuries wjile in [place]. High Risk     Therapy Plan: PT Intensity: Minimum of 1-2 x/day ,45 to 90 minutes PT Frequency: 5 out of 7 days PT Duration Estimated Length of Stay: 2.5-3 weeks OT Intensity: Minimum of 1-2 x/day, 45 to 90 minutes OT Frequency: 5 out of 7 days OT Duration/Estimated Length of Stay: 18-21 days SLP Intensity: Minumum of 1-2 x/day, 30 to 90 minutes SLP Frequency: 3 to 5 out of 7 days SLP Duration/Estimated Length of Stay: 18-21 days    Team Interventions: Nursing Interventions Patient/Family Education, Bowel Management, Pain  Management, Skin Care/Wound Management, Psychosocial Support, Discharge Planning, Bladder Management, Disease Management/Prevention, Medication Management, Cognitive Remediation/Compensation, Dysphagia/Aspiration Precaution Training  PT interventions Ambulation/gait training, DME/adaptive equipment instruction, Neuromuscular re-education, Stair training, UE/LE Strength taining/ROM, Wheelchair propulsion/positioning, Therapeutic Activities, Training and development officer, Functional mobility training, Patient/family education, Therapeutic Exercise  OT Interventions Balance/vestibular training, Discharge planning, Functional mobility training, DME/adaptive equipment instruction, Patient/family education, Self Care/advanced ADL retraining, Therapeutic Activities, Therapeutic Exercise, UE/LE Strength taining/ROM, Cognitive remediation/compensation, Neuromuscular re-education, Psychosocial support, UE/LE Coordination activities  SLP Interventions Cognitive remediation/compensation, Cueing hierarchy, Dysphagia/aspiration precaution training, Functional tasks  TR Interventions    SW/CM Interventions Discharge Planning, Psychosocial Support, Patient/Family Education   Barriers to Discharge MD  Medical stability, Wound care and Hemodialysis  Nursing      PT Inaccessible home environment two level home  OT Inaccessible home environment pt's bathroom and bedroom on 2nd floor, she can reside on 1st floor but wants to sleep upstairs  SLP      SW       Team Discharge Planning: Destination: PT-Home ,OT- Home , SLP-Home Projected Follow-up: PT-Home health PT, 24 hour supervision/assistance, OT-  Home health OT, SLP-Home Health SLP, Outpatient SLP Projected Equipment Needs: PT-To be determined, OT- 3 in 1 bedside comode, To be determined, SLP-None recommended by SLP Equipment Details: PT- , OT-  Patient/family involved in discharge planning: PT- Patient,  OT-Patient, SLP-Patient, Family member/caregiver  MD  ELOS: 16-19 days. 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 She was admitted on 08/11/17 with ischemic right foot with gangrenous changes and underwent right femoral to below knee BG and right great toe amputation by Dr. Donnetta Hutching. Post op WBAT and was admitted to rehab 1/21/2019for inpatient therapies to consist of PT and OT at least three hours five days a week. She was medically stable and right great toe amputation site as well as RLE bypass incisions were healing well. Reactive leucocytosis was resolving and ABLA was stable. She was progressing to modified independent level but on 08/23/17 she sustained a fall. CT ordered and reviewed, showing left SDH.  Per report, large left SDH.  She had a decline in MS. She was taken to OR emergently for left craniotomy the same day by Dr. Christella Noa. She developed recurrent acute SDH requiring surgical evacuation on 01/30. hospital course significant for labile BP requiring Cardene drip, hyponatremia as well as VDRF. She tolerated extubation on 01/31 and mentation improving with occasional bouts of confusion. Dr. Donnetta Hutching following for input and right groin wound noted to have fat necrosis which was debrided at bedside today with recommendations for wet to dry dressing changes. Therapy evaluations done and CIR recommended due to generalized weakness, decreased balance as well as issues with delayed processing.  Will set goals for Supervision with PT/OT and Mod I with SLP.   See Team Conference Notes for weekly updates to the plan of care

## 2017-08-30 NOTE — Progress Notes (Signed)
Patient ID: Destiny Day, female   DOB: 05-08-1951, 67 y.o.   MRN: [id number] Looks good today.  Her husband and son are present in the room as well.  Answers questions appropriately and moves all 4.  Denies any pain in her groin or leg.  Her right great toe amputation is completely healed and her sutures are removed.  She has 2+ right dorsalis pedis pulse.  Popliteal incision is completely healed as well.  No real change in her right groin since my last examination on Friday.  She does have some fat necrosis in the upper pole of the incision with no surrounding.  I sharply debrided the skin and subcutaneous fat with excisional debridement at the bedside.  She has approximately 1 cm meter wound that is approximately 1 cm deep.  No evidence of fluctuance or deeper necrosis.  No evidence of graft involvement.  This was packed with normal saline soaked gauze and will write for twice a day normal saline wet-to-dry to this open area.  Following with you.

## 2017-08-30 NOTE — Progress Notes (Signed)
Has head phone at bedside

## 2017-08-30 NOTE — PMR Pre-admission (Signed)
PMR Admission Coordinator Pre-Admission Assessment  Patient: Destiny Day is an 67 y.o., female MRN: [id number] DOB: 03-Nov-1950 Height: '5\' 5"'$  (165.1 cm) Weight: 69 kg (152 lb 1.9 oz)              Insurance Information HMO:    PPO:      PCP:      IPA:      80/20: yes     OTHER: no HMO PRIMARY: Medicare a and b      Policy#: [id number]      Subscriber: pt Benefits:  Phone #: passport one online     Name:  08/30/2017 Eff. Date: 02/25/2016     Deduct: $1364      Out of Pocket Max: none      Life Max: none CIR: 100%      SNF: 20 full days Outpatient: 80%     Co-Pay: 20% Home Health: 100%      Co-Pay: none DME: 80%     Co-Pay: 20% Providers: pt choice  SECONDARY: [employer]      Policy#: [id number]      Subscriber: pt  Medicaid Application Date:       Case Manager:  Disability Application Date:       Case Worker:   Emergency [place] Information    Name Relation Home Work Mobile   Giammarco,[name] Spouse [phone]  [phone]   Ramseur,Sheree Daughter [phone]     Mileidy, Atkin [phone]       Current Medical History  Patient Admitting Diagnosis: SDH  History of Present Illness:   Brief HPI:   Destiny Bolon McAdoois a 67 y.o.femalewith history of ESRD- HD, diastolic congestive heart failure, diabetes mellitus, HTN, PVD s/p 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 Marland Kitchen She was admitted on 08/11/17 with ischemic right foot with gangrenous changes and underwent right femoral to below knee BG and right great toe amputation by Dr. Donnetta Hutching. Post op WBAT. She has had some drainage from right thigh incisions and reactive leucocytosis resolving. ABLA was being monitored and with use of aranesp for anemia of chronic disease.   Hospital Course: Destiny Day was admitted to rehab 08/16/2017 for inpatient therapies to consist of PT and OT at least three hours five days a week. Past admission physiatrist, therapy team and rehab RN have worked  together to provide customized collaborative inpatient rehab. Right Fem-pop incision and right great toe amputation sites were healing well without signs of symptoms of infection. No signs of bleeding noted on Plavix. HD was ongoing at the end of the day to help with activity tolerance. Gabapentin was titrated upwards to help with neuropathy. Velphoro was discontinued due to issues with N/V due to intolerance of medication. H/H was monitored with serial checks and has been stable. Leucocytosis was resolving. Diabetes has been monitored with ac/hs cbg checks and blood sugars have been controlled on levemir. Blood pressures were controlled off hydralazine. She was making steady progress in therapy and was set for discharge on 01/29.    On early am of 1/28, patient was found on the floor with laceration on right lateral forehead. she was alert and appropriate without neurologic changes. CT head ordered for work up and she developed N/V with decline in [name] while in radiology. She was found to have large left  SDH with left to right shift. Neurosurgery was contacted for discharge to acute. Dr. Nelda Marseille was contacted for assistance due to somnolence and concerns of ability to protect airway.  She was discharged to Neuro ICU for intubation and surgical intervention.   Patient admitted to Dr. Christella Noa for Head CT revealed a large Acute SDH on the left side causing mass effect, midline shift left to right, and impending herniation. Emergently taken to OR for decompressive craniotomy on 08/23/2017. Extubated 08/24/17. Patient received two hours of dialysis on 1/29 and was felt to be less alert. Repeat CT with recurrent subdural hematoma. Patient taken back to OR 08/24/17 for evacuation of clot. Remained intubated post op. Extubated 08/26/17. Patient receiving prn BP meds since on acute for hypertension. To restart po HTN meds when taking diet and readmit to CIR. Was on amlodipine 5/isosorbide on CIR. Nephrology managing  hemodialysis. No heparin with HD for for seeable future. Anemia with pt transfused 2 units 08/24/17. Receiving Aranesp with HD 1/30.   PT and OT reevaluated pt on 08/28/2017 and pt felt appropriate for readmission to inpt rehab.  Past Medical History  Past Medical History:  Diagnosis Date  . Abdominal bruit   . Anemia   . Anxiety   . Arthritis    Osteoarthritis  . Asthma   . Cervical disc disease    "pinced nerve"  . CHF (congestive heart failure) ([place])   . Complication of anesthesia    " after I got home from my last procedure, I started itching."  . Diabetes mellitus    Type II  . Diverticulitis   . ESRD on peritoneal dialysis [facility])    Hemodialysis - MWF- [place]   . GERD (gastroesophageal reflux disease)    from medications  . GI bleed 03/31/2013  . History of hiatal hernia   . Hyperlipidemia   . Hypertension   . Neuropathy    left leg  . Osteoporosis   . Peripheral vascular disease ([place])   . Pneumonia    "very young"  . Seasonal allergies   . Shortness of breath dyspnea    WIth exertion  . Sleep apnea    can't afford cpap    Family History  family history includes Diabetes in her father; Other in her mother; Pancreatic cancer in her maternal grandmother.  Prior Rehab/Hospitalizations:  Has the patient had major surgery during 100 days prior to admission? Yes RLE bypass and toe amputation 08/11/2017. CIR 08/16/2017 and discharged to acute after a fall on 08/23/2017 for surgery.  Patient received SNF rehab at [facility] last year. She left after 8 days due to not happy with her nursing care there.  Current Medications   Current Facility-Administered Medications:  .  0.45 % sodium chloride infusion, , Intravenous, Continuous, Magdalen Spatz, NP, Last Rate: 10 mL/hr at 08/27/17 0135 .  albuterol (PROVENTIL) (2.5 MG/3ML) 0.083% nebulizer solution 2.5 mg, 2.5 mg, Nebulization, Q2H PRN, Corey Harold, NP .  alteplase (CATHFLO ACTIVASE) injection 2 mg, 2 mg,  Intracatheter, Once PRN, Alric Seton, PA-C .  chlorhexidine gluconate (MEDLINE KIT) (PERIDEX) 0.12 % solution 15 mL, 15 mL, Mouth Rinse, BID, Corey Harold, NP, Stopped at 08/29/17 0930 .  cloNIDine (CATAPRES) tablet 0.2 mg, 0.2 mg, Oral, BID, Ashok Pall, MD, 0.2 mg at 08/30/17 1409 .  Darbepoetin Alfa (ARANESP) injection 150 mcg, 150 mcg, Intravenous, Q Thu-HD, Jamal Maes, MD, 150 mcg at 08/26/17 1217 .  ferric citrate (AURYXIA) tablet 420 mg, 420 mg, Oral, TID WC, Ashok Pall, MD, 420 mg at 08/29/17 1432 .  ferric gluconate (NULECIT) 125 mg in sodium chloride 0.9 % 100 mL IVPB, 125 mg, Intravenous, Q M,W,F-HD,  Jamal Maes, MD, Stopped at 08/30/17 1200 .  fluticasone (FLONASE) 50 MCG/ACT nasal spray 1 spray, 1 spray, Each Nare, Daily PRN, Ashok Pall, MD .  gabapentin (NEURONTIN) capsule 100 mg, 100 mg, Oral, QHS, Ashok Pall, MD, 100 mg at 08/29/17 2258 .  hydrALAZINE (APRESOLINE) tablet 25 mg, 25 mg, Oral, 2 times per day on Sun Tue Thu Sat, Christella Noa, Marylyn Ishihara, MD, 25 mg at 08/29/17 2258 .  HYDROcodone-acetaminophen (NORCO/VICODIN) 5-325 MG per tablet 1 tablet, 1 tablet, Oral, Q4H PRN, Ashok Pall, MD, 1 tablet at 08/30/17 0845 .  insulin aspart (novoLOG) injection 2-6 Units, 2-6 Units, Subcutaneous, Q4H, Corey Harold, NP, 2 Units at 08/29/17 1645 .  labetalol (NORMODYNE,TRANDATE) injection 10-40 mg, 10-40 mg, Intravenous, Q10 min PRN, Ashok Pall, MD, 40 mg at 08/28/17 0416 .  MEDLINE mouth rinse, 15 mL, Mouth Rinse, BID, Cabbell, Kyle, MD, 15 mL at 08/29/17 2300 .  mometasone-formoterol (DULERA) 200-5 MCG/ACT inhaler 2 puff, 2 puff, Inhalation, BID, Ashok Pall, MD, 2 puff at 08/29/17 1954 .  naloxone (NARCAN) injection 0.08 mg, 0.08 mg, Intravenous, PRN, Ashok Pall, MD .  pantoprazole (PROTONIX) injection 40 mg, 40 mg, Intravenous, Daily, Corey Harold, NP, 40 mg at 08/30/17 1410 .  sodium chloride flush (NS) 0.9 % injection 10-40 mL, 10-40 mL, Intracatheter,  PRN, Rush Farmer, MD, 10 mL at 08/28/17 1040  Patients Current Diet: Diet renal/carb modified with fluid restriction Diet-HS Snack? Nothing; Fluid restriction: 1200 mL Fluid; Room service appropriate? Yes; Fluid consistency: Thin  Precautions / Restrictions Precautions Precautions: Fall Other Brace/Splint: Per rehab notes, pt had Darco shoe for Rt LE  Restrictions Weight Bearing Restrictions: Yes RLE Weight Bearing: Weight bearing as tolerated   Has the patient had 2 or more falls or a fall with injury in the past year?No  Prior Activity Level Pt was Mod I with cane pta and drove self to out pt dialysis  Home Assistive Devices / Equipment Home Assistive Devices/Equipment: Wheelchair Home Equipment: Environmental consultant - 4 wheels, [place] - single point, Civil engineer, contracting, Crutches  Prior Device Use: Indicate devices/aids used by the patient prior to current illness, exacerbation or injury? Pt used cane pta 07/2017  Prior Functional Level Prior Function Level of Independence: Independent with assistive device(s) Comments: walked with cane  Self Care: Did the patient need help bathing, dressing, using the toilet or eating?  Independent  Indoor Mobility: Did the patient need assistance with walking from room to room (with or without device)? Independent  Stairs: Did the patient need assistance with internal or external stairs (with or without device)? Independent  Functional Cognition: Did the patient need help planning regular tasks such as shopping or remembering to take medications? Independent  Current Functional Level Cognition  Arousal/Alertness: Awake/alert Overall Cognitive Status: Impaired/Different from baseline Current Attention Level: Sustained, Focused Orientation Level: Oriented to person, Oriented to place, Oriented to situation, Disoriented to time Following Commands: Follows one step commands inconsistently, Follows one step commands with increased time General Comments: Pt  appropriate but lethargic.  Requires increased time to initiate activity.  Moves very slowly     Extremity Assessment (includes Sensation/Coordination)  Upper Extremity Assessment: Defer to OT evaluation RUE Deficits / Details: movement consistent with Brunnstrom stage IV UE and hand  RUE Sensation: decreased light touch LUE Deficits / Details: UE brunnstrom stage IV, and hand stage V   Lower Extremity Assessment: RLE deficits/detail, LLE deficits/detail RLE Deficits / Details: grossly 3/5 except ankle 3-/5 LLE Deficits / Details: grossly 3/5  ADLs  Overall ADL's : Needs assistance/impaired Eating/Feeding: Maximal assistance, Sitting, Bed level Grooming: Wash/dry hands, Wash/dry face, Oral care, Maximal assistance, Sitting Upper Body Bathing: Total assistance, Sitting Lower Body Bathing: Total assistance, Sit to/from stand Upper Body Dressing : Total assistance, Sitting Lower Body Dressing: Total assistance, Sit to/from stand Toilet Transfer: Maximal assistance, +2 for physical assistance, Stand-pivot, BSC Toileting- Clothing Manipulation and Hygiene: Total assistance, Sit to/from stand Functional mobility during ADLs: Maximal assistance, +2 for physical assistance General ADL Comments: activity limited by lethargy.  She requires a significant amount of time to initiate and execute movement/activity     Mobility  Overal bed mobility: Needs Assistance Bed Mobility: Supine to Sit Supine to sit: Max assist General bed mobility comments: assist to move LE off bed and to lift trunk from bed     Transfers  Overall transfer level: Needs assistance Equipment used: 2 person hand held assist Transfers: Sit to/from Stand, Stand Pivot Transfers Sit to Stand: Max assist, +2 physical assistance Stand pivot transfers: Max assist, +2 physical assistance General transfer comment: assist to power up into standing.  bil. knees blocked to prevent buckling.  Assist provided at bil. ischiums to  extend hips.  Assist to pivot feet and swing hips to chair.      Ambulation / Gait / Stairs / Wheelchair Mobility  Ambulation/Gait General Gait Details: Not attempted more than bed to chair due to nausea and vomiting.    Posture / Balance Dynamic Sitting Balance Sitting balance - Comments: requires min - mod A to maintain static sitting EOB  Balance Overall balance assessment: Needs assistance Sitting-balance support: Feet supported, Bilateral upper extremity supported Sitting balance-Leahy Scale: Poor Sitting balance - Comments: requires min - mod A to maintain static sitting EOB  Standing balance support: Bilateral upper extremity supported Standing balance-Leahy Scale: Poor Standing balance comment: requires max A +2     Special needs/care consideration BiPAP/CPAP n/a CPM  N/a Continuous Drip IV  N/a Dialysis  [facility] MWF drove self; SCAT has already eben set up per pt Life Vest  N/a Oxygen  N/a Special Bed  N/a Trach Size  N/a Wound Vac surgical incision for SDH staples with head dressing with old drainage to dressing Skin right foot with some cracking skin, skin tear to sacrum medial area       08/30/2017 per Dr. Donnetta Hutching  "Her right great toe amputation is completely healed and her sutures are removed.  She has 2+ right dorsalis pedis pulse.  Popliteal incision is completely healed as well.  No real change in her right groin since my last examination on Friday.  She does have some fat necrosis in the upper pole of the incision with no surrounding.  I sharply debrided the skin and subcutaneous fat with excisional debridement at the bedside.  She has approximately 1 cm meter wound that is approximately 1 cm deep.  No evidence of fluctuance or deeper necrosis.  No evidence of graft involvement.  This was packed with normal saline soaked gauze and will write for twice a day normal saline wet-to-dry to this open area.  Following with you."                     Bowel mgmt: last BM  08/24/2017 Bladder mgmt: external catheter Diabetic mgmt yes Spouse requesting assistance with information on stair lift for home use   Previous Home Environment Living Arrangements: Spouse/significant other  Lives With: Spouse, Other (Comment) Available Help at Discharge: Family,  Available 24 hours/day(spouse has taken FMLA to assist after d/c this time due to h) Type of Home: House Home Layout: Multi-level, Bed/bath upstairs Alternate Level Stairs-Rails: Left Alternate Level Stairs-Number of Steps: 6 and 6 split level Home Access: Stairs to enter Entrance Stairs-Rails: Left Entrance Stairs-Number of Steps: 2 Bathroom Shower/Tub: Tub/shower unit, Industrial/product designer: Yes How Accessible: Accessible via walker Home Care Services: No Additional Comments: 2 rollators at home, walker, [facility]  Discharge Living Setting Plans for Discharge Living Setting: Patient's home, Lives with (comment) Type of Home at Discharge: House Discharge Home Layout: Multi-level Alternate Level Stairs-Rails: Left Alternate Level Stairs-Number of Steps: 6 and 6 split level Discharge Home Access: Stairs to enter Entrance Stairs-Rails: Left Entrance Stairs-Number of Steps: 2 Discharge Bathroom Shower/Tub: Tub/shower unit, Curtain Discharge Bathroom Toilet: Standard Discharge Bathroom Accessibility: Yes How Accessible: Accessible via walker Does the patient have any problems obtaining your medications?: No  Social/Family/Support Systems Patient Roles: Spouse, Parent Contact Information: spouse Anticipated Caregiver: spouse Anticipated Caregiver's Contact Information: see above Ability/Limitations of Caregiver: Pt spouse works two p/t jobs; pt's spouse now on FMLA to care for pt at d/c Caregiver Availability: 24/7 Discharge Plan Discussed with Primary Caregiver: Yes Is Caregiver In Agreement with Plan?: Yes Does Caregiver/Family have Issues with Lodging/Transportation  while Pt is in Rehab?: No  Goals/Additional Needs Patient/Family Goal for Rehab: min assist with PT, OT, and SLP Special Service Needs: Hemodialysis MWF as oupatietn; will need [facility] tranportation at d/c Pt/Family Agrees to Admission and willing to participate: Yes Program Orientation Provided & Reviewed with Pt/Caregiver Including Roles  & Responsibilities: Yes  Decrease burden of Care through IP rehab admission: n/a  Possible need for SNF placement upon discharge: not anticipated  Patient Condition: Patient's initial Rehabilitation  consult was on 08/13/2017 prior to admission to inpatient acute rehab on 08/16/2017. Patient fell on 08/23/2017 and readmitted to acute hospital  for emergent surgery. Case discussed with Dr. Delice Lesch on 08/30/2017 and I and Dr. Posey Pronto assessed patient at bedside for readmission to Acute Inpatient Rehabilitation. Patient will benefit from the ongoing coordinated team approach during an Inpatient Acute rehab admission. She will receive three hours per day of therapy with PT, OT, and SLP as well as Rehabilitative Medicine and Nursing Care. She is currently max assist overall. We will admit to Acute Inpatient Rehabilitation today.  Preadmission Screen Completed By:  Cleatrice Burke, 08/30/2017 3:49 PM ______________________________________________________________________   Discussed status with Dr. Posey Pronto on 08/30/2017 at  1552 nd received telephone approval for admission today.  Admission Coordinator:  Cleatrice Burke, time [id number] Date 08/30/2017

## 2017-08-30 NOTE — Progress Notes (Signed)
[name], RN  Rehab Admission Coordinator  Physical Medicine and Rehabilitation  PMR Pre-admission  Signed  Date of Service:  08/30/2017 2:23 PM       Related encounter: Admission (Discharged) from 08/23/2017 in [facility]      Signed           '[]' Hide copied text  '[]' Hover for details   PMR Admission Coordinator Pre-Admission Assessment  Patient: Destiny Day is an 67 y.o., female MRN: [id number] DOB: 15-Feb-1951 Height: '5\' 5"'  (165.1 cm) Weight: 69 kg (152 lb 1.9 oz)                                                                                                                                                  Insurance Information HMO:    PPO:      PCP:      IPA:      80/20: yes     OTHER: no HMO PRIMARY: Medicare a and b      Policy#: [id number]      Subscriber: pt Benefits:  Phone #: passport one online     Name:  08/30/2017 Eff. Date: 02/25/2016     Deduct: $1364      Out of Pocket Max: none      Life Max: none CIR: 100%      SNF: 20 full days Outpatient: 80%     Co-Pay: 20% Home Health: 100%      Co-Pay: none DME: 80%     Co-Pay: 20% Providers: pt choice  SECONDARY: [employer]      Policy#: [id number]      Subscriber: pt  Medicaid Application Date:       Case Manager:  Disability Application Date:       Case Worker:   Emergency [place]    Name Relation Home Work Mobile   Cretella,[name] Spouse [phone]  [phone]   Ramseur,Sheree Daughter [phone]     Adaline, Trejos [phone]       Current Medical History  Patient Admitting Diagnosis: SDH  History of Present Illness:   Brief RSW:NIOEVOJJK A McAdoois a 67 y.o.femalewith history of ESRD- HD, diastolic congestive heart failure, diabetes mellitus, HTN, PVD s/p 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 Marland Kitchen She was admitted on 08/11/17 with ischemic right foot with gangrenous changes and  underwent right femoral to below knee BG and right great toe amputation by Dr. Donnetta Hutching. Post op WBAT. She has had some drainage from right thigh incisions and reactive leucocytosis resolving. ABLAwasbeing monitored andwith use ofaranesp for anemia of chronic disease.   [place] admitted to rehab 1/21/2019for inpatient therapies to consist of PT and OT at least three hours five days a week. Past admission physiatrist, therapy team and rehab RN have worked  together to provide customized collaborative inpatient rehab. Right Fem-pop incision and right great toe amputation sites were healing well without signs of symptoms of infection. No signs of bleeding noted on Plavix. HD was ongoing at the end of the day to help with activity tolerance. Gabapentin was titrated upwards to help with neuropathy. Velphoro was discontinued due to issues with N/V due to intolerance of medication. H/H was monitored with serial checks and has been stable. Leucocytosis was resolving. Diabetes has been monitored with ac/hs cbg checks and blood sugars have been controlled on levemir. Blood pressures were controlled off hydralazine. She was making steady progress in therapy and was set for discharge on 01/29.   On early am of 1/28, patient was found on the floor with laceration on right lateral forehead. she was alert and appropriate without neurologic changes. CT head ordered for work up and she developed N/V with decline in [place] while in radiology. She was found to have large left SDH with left to right shift. Neurosurgery was contacted for discharge to acute. Dr. Nelda Marseille was contacted for assistance due to somnolence and concerns of ability to protect airway. She was discharged to Neuro ICU for intubation and surgical intervention.   Patient admitted to Dr. Christella Noa for Head CT revealed a large Acute SDH on the left side causing mass effect, midline shift left to right, and impending herniation.  Emergently taken to OR for decompressive craniotomy on 08/23/2017. Extubated 08/24/17. Patient received two hours of dialysis on 1/29 and was felt to be less alert. Repeat CT with recurrent subdural hematoma. Patient taken back to OR 08/24/17 for evacuation of clot. Remained intubated post op. Extubated 08/26/17. Patient receiving prn BP meds since on acute for hypertension. To restart po HTN meds when taking diet and readmit to CIR. Was on amlodipine 5/isosorbide on CIR. Nephrology managing hemodialysis. No heparin with HD for for seeable future. Anemia with pt transfused 2 units 08/24/17. Receiving Aranesp with HD 1/30.   PT and OT reevaluated pt on 08/28/2017 and pt felt appropriate for readmission to inpt rehab.  Past Medical History      Past Medical History:  Diagnosis Date  . Abdominal bruit   . Anemia   . Anxiety   . Arthritis    Osteoarthritis  . Asthma   . Cervical disc disease    "pinced nerve"  . CHF (congestive heart failure) ([place])   . Complication of anesthesia    " after I got home from my last procedure, I started itching."  . Diabetes mellitus    Type II  . Diverticulitis   . ESRD on peritoneal dialysis [facility])    Hemodialysis - MWF- [place]   . GERD (gastroesophageal reflux disease)    from medications  . GI bleed 03/31/2013  . History of hiatal hernia   . Hyperlipidemia   . Hypertension   . Neuropathy    left leg  . Osteoporosis   . Peripheral vascular disease ([place])   . Pneumonia    "very young"  . Seasonal allergies   . Shortness of breath dyspnea    WIth exertion  . Sleep apnea    can't afford cpap    Family History  family history includes Diabetes in her father; Other in her mother; Pancreatic cancer in her maternal grandmother.  Prior Rehab/Hospitalizations:  Has the patient had major surgery during 100 days prior to admission? Yes RLE bypass and toe amputation 08/11/2017. CIR 08/16/2017 and discharged to acute after a fall  on 08/23/2017 for surgery.  Patient received SNF rehab at [facility] last year. She left after 8 days due to not happy with her nursing care there.  Current Medications   Current Facility-Administered Medications:  .  0.45 % sodium chloride infusion, , Intravenous, Continuous, Magdalen Spatz, NP, Last Rate: 10 mL/hr at 08/27/17 0135 .  albuterol (PROVENTIL) (2.5 MG/3ML) 0.083% nebulizer solution 2.5 mg, 2.5 mg, Nebulization, Q2H PRN, Corey Harold, NP .  alteplase (CATHFLO ACTIVASE) injection 2 mg, 2 mg, Intracatheter, Once PRN, Alric Seton, PA-C .  chlorhexidine gluconate (MEDLINE KIT) (PERIDEX) 0.12 % solution 15 mL, 15 mL, Mouth Rinse, BID, Corey Harold, NP, Stopped at 08/29/17 0930 .  cloNIDine (CATAPRES) tablet 0.2 mg, 0.2 mg, Oral, BID, Ashok Pall, MD, 0.2 mg at 08/30/17 1409 .  Darbepoetin Alfa (ARANESP) injection 150 mcg, 150 mcg, Intravenous, Q Thu-HD, Jamal Maes, MD, 150 mcg at 08/26/17 1217 .  ferric citrate (AURYXIA) tablet 420 mg, 420 mg, Oral, TID WC, Ashok Pall, MD, 420 mg at 08/29/17 1432 .  ferric gluconate (NULECIT) 125 mg in sodium chloride 0.9 % 100 mL IVPB, 125 mg, Intravenous, Q M,W,F-HD, Jamal Maes, MD, Stopped at 08/30/17 1200 .  fluticasone (FLONASE) 50 MCG/ACT nasal spray 1 spray, 1 spray, Each Nare, Daily PRN, Ashok Pall, MD .  gabapentin (NEURONTIN) capsule 100 mg, 100 mg, Oral, QHS, Ashok Pall, MD, 100 mg at 08/29/17 2258 .  hydrALAZINE (APRESOLINE) tablet 25 mg, 25 mg, Oral, 2 times per day on Sun Tue Thu Sat, Christella Noa, Marylyn Ishihara, MD, 25 mg at 08/29/17 2258 .  HYDROcodone-acetaminophen (NORCO/VICODIN) 5-325 MG per tablet 1 tablet, 1 tablet, Oral, Q4H PRN, Ashok Pall, MD, 1 tablet at 08/30/17 0845 .  insulin aspart (novoLOG) injection 2-6 Units, 2-6 Units, Subcutaneous, Q4H, Corey Harold, NP, 2 Units at 08/29/17 1645 .  labetalol (NORMODYNE,TRANDATE) injection 10-40 mg, 10-40 mg, Intravenous, Q10 min PRN, Ashok Pall, MD, 40 mg at  08/28/17 0416 .  MEDLINE mouth rinse, 15 mL, Mouth Rinse, BID, Cabbell, Kyle, MD, 15 mL at 08/29/17 2300 .  mometasone-formoterol (DULERA) 200-5 MCG/ACT inhaler 2 puff, 2 puff, Inhalation, BID, Ashok Pall, MD, 2 puff at 08/29/17 1954 .  naloxone (NARCAN) injection 0.08 mg, 0.08 mg, Intravenous, PRN, Ashok Pall, MD .  pantoprazole (PROTONIX) injection 40 mg, 40 mg, Intravenous, Daily, Corey Harold, NP, 40 mg at 08/30/17 1410 .  sodium chloride flush (NS) 0.9 % injection 10-40 mL, 10-40 mL, Intracatheter, PRN, Rush Farmer, MD, 10 mL at 08/28/17 1040  Patients Current Diet: Diet renal/carb modified with fluid restriction Diet-HS Snack? Nothing; Fluid restriction: 1200 mL Fluid; Room service appropriate? Yes; Fluid consistency: Thin  Precautions / Restrictions Precautions Precautions: Fall Other Brace/Splint: Per rehab notes, pt had Darco shoe for Rt LE  Restrictions Weight Bearing Restrictions: Yes RLE Weight Bearing: Weight bearing as tolerated   Has the patient had 2 or more falls or a fall with injury in the past year?No  Prior Activity Level Pt was Mod I with cane pta and drove self to out pt dialysis  Home Assistive Devices / Equipment Home Assistive Devices/Equipment: Wheelchair Home Equipment: Environmental consultant - 4 wheels, [place] - single point, Civil engineer, contracting, Crutches  Prior Device Use: Indicate devices/aids used by the patient prior to current illness, exacerbation or injury? Pt used cane pta 07/2017  Prior Functional Level Prior Function Level of Independence: Independent with assistive device(s) Comments: walked with cane  Self Care: Did the patient need help bathing,  dressing, using the toilet or eating?  Independent  Indoor Mobility: Did the patient need assistance with walking from room to room (with or without device)? Independent  Stairs: Did the patient need assistance with internal or external stairs (with or without device)? Independent  Functional  Cognition: Did the patient need help planning regular tasks such as shopping or remembering to take medications? Independent  Current Functional Level Cognition  Arousal/Alertness: Awake/alert Overall Cognitive Status: Impaired/Different from baseline Current Attention Level: Sustained, Focused Orientation Level: Oriented to person, Oriented to place, Oriented to situation, Disoriented to time Following Commands: Follows one step commands inconsistently, Follows one step commands with increased time General Comments: Pt appropriate but lethargic.  Requires increased time to initiate activity.  Moves very slowly     Extremity Assessment (includes Sensation/Coordination)  Upper Extremity Assessment: Defer to OT evaluation RUE Deficits / Details: movement consistent with Brunnstrom stage IV UE and hand  RUE Sensation: decreased light touch LUE Deficits / Details: UE brunnstrom stage IV, and hand stage V   Lower Extremity Assessment: RLE deficits/detail, LLE deficits/detail RLE Deficits / Details: grossly 3/5 except ankle 3-/5 LLE Deficits / Details: grossly 3/5    ADLs  Overall ADL's : Needs assistance/impaired Eating/Feeding: Maximal assistance, Sitting, Bed level Grooming: Wash/dry hands, Wash/dry face, Oral care, Maximal assistance, Sitting Upper Body Bathing: Total assistance, Sitting Lower Body Bathing: Total assistance, Sit to/from stand Upper Body Dressing : Total assistance, Sitting Lower Body Dressing: Total assistance, Sit to/from stand Toilet Transfer: Maximal assistance, +2 for physical assistance, Stand-pivot, BSC Toileting- Clothing Manipulation and Hygiene: Total assistance, Sit to/from stand Functional mobility during ADLs: Maximal assistance, +2 for physical assistance General ADL Comments: activity limited by lethargy.  She requires a significant amount of time to initiate and execute movement/activity     Mobility  Overal bed mobility: Needs Assistance Bed  Mobility: Supine to Sit Supine to sit: Max assist General bed mobility comments: assist to move LE off bed and to lift trunk from bed     Transfers  Overall transfer level: Needs assistance Equipment used: 2 person hand held assist Transfers: Sit to/from Stand, Stand Pivot Transfers Sit to Stand: Max assist, +2 physical assistance Stand pivot transfers: Max assist, +2 physical assistance General transfer comment: assist to power up into standing.  bil. knees blocked to prevent buckling.  Assist provided at bil. ischiums to extend hips.  Assist to pivot feet and swing hips to chair.      Ambulation / Gait / Stairs / Wheelchair Mobility  Ambulation/Gait General Gait Details: Not attempted more than bed to chair due to nausea and vomiting.    Posture / Balance Dynamic Sitting Balance Sitting balance - Comments: requires min - mod A to maintain static sitting EOB  Balance Overall balance assessment: Needs assistance Sitting-balance support: Feet supported, Bilateral upper extremity supported Sitting balance-Leahy Scale: Poor Sitting balance - Comments: requires min - mod A to maintain static sitting EOB  Standing balance support: Bilateral upper extremity supported Standing balance-Leahy Scale: Poor Standing balance comment: requires max A +2     Special needs/care consideration BiPAP/CPAP n/a CPM  N/a Continuous Drip IV  N/a Dialysis  [facility] MWF drove self; SCAT has already eben set up per pt Life Vest  N/a Oxygen  N/a Special Bed  N/a Trach Size  N/a Wound Vac surgical incision for SDH staples with head dressing with old drainage to dressing Skin right foot with some cracking skin, skin tear to sacrum medial area  08/30/2017 per Dr. Donnetta Hutching  "Her right great toe amputation is completely healed and her sutures are removed. She has 2+ right dorsalis pedis pulse. Popliteal incision is completely healed as well.  No real change in her right groin since my last examination on  Friday. She does have some fat necrosis in the upper pole of the incision with no surrounding. I sharply debrided the skin and subcutaneous fat with excisional debridement at the bedside. She has approximately 1 cm meter wound that is approximately 1 cm deep. No evidence of fluctuance or deeper necrosis. No evidence of graft involvement. This was packed with normal saline soaked gauze and will write for twice a day normal saline wet-to-dry to this open area. Following with you."                     Bowel mgmt: last BM 08/24/2017 Bladder mgmt: external catheter Diabetic mgmt yes Spouse requesting assistance with information on stair lift for home use   Previous Home Environment Living Arrangements: Spouse/significant other  Lives With: Spouse, Other (Comment) Available Help at Discharge: Family, Available 24 hours/day(spouse has taken FMLA to assist after d/c this time due to h) Type of Home: House Home Layout: Multi-level, Bed/bath upstairs Alternate Level Stairs-Rails: Left Alternate Level Stairs-Number of Steps: 6 and 6 split level Home Access: Stairs to enter Entrance Stairs-Rails: Left Entrance Stairs-Number of Steps: 2 Bathroom Shower/Tub: Tub/shower unit, Industrial/product designer: Yes How Accessible: Accessible via walker [place]: No Additional Comments: 2 rollators at home, walker, [facility]  Discharge Living Setting Plans for Discharge Living Setting: Patient's home, Lives with (comment) Type of Home at Discharge: House Discharge Home Layout: Multi-level Alternate Level Stairs-Rails: Left Alternate Level Stairs-Number of Steps: 6 and 6 split level Discharge Home Access: Stairs to enter Entrance Stairs-Rails: Left Entrance Stairs-Number of Steps: 2 Discharge Bathroom Shower/Tub: Tub/shower unit, Curtain Discharge Bathroom Toilet: Standard Discharge Bathroom Accessibility: Yes How Accessible: Accessible via walker Does the  patient have any problems obtaining your medications?: No  Social/Family/Support Systems Patient Roles: Spouse, Parent Contact Information: spouse Anticipated Caregiver: spouse Anticipated Caregiver's Contact Information: see above Ability/Limitations of Caregiver: Pt spouse works two p/t jobs; pt's spouse now on FMLA to care for pt at d/c Caregiver Availability: 24/7 Discharge Plan Discussed with Primary Caregiver: Yes Is Caregiver In Agreement with Plan?: Yes Does Caregiver/Family have Issues with Lodging/Transportation while Pt is in Rehab?: No  Goals/Additional Needs Patient/Family Goal for Rehab: min assist with PT, OT, and SLP Special Service Needs: Hemodialysis MWF as oupatietn; will need SCAT tranportation at d/c Pt/Family Agrees to Admission and willing to participate: Yes Program Orientation Provided & Reviewed with Pt/Caregiver Including Roles  & Responsibilities: Yes  Decrease burden of Care through IP rehab admission: n/a  Possible need for SNF placement upon discharge: not anticipated  Patient Condition: Patient's initial Rehabilitation  consult was on 08/13/2017 prior to admission to inpatient acute rehab on 08/16/2017. Patient fell on 08/23/2017 and readmitted to acute hospital  for emergent surgery. Case discussed with Dr. Delice Lesch on 08/30/2017 and I and Dr. Posey Pronto assessed patient at bedside for readmission to Acute Inpatient Rehabilitation. Patient will benefit from the ongoing coordinated team approach during an Inpatient Acute rehab admission. She will receive three hours per day of therapy with PT, OT, and SLP as well as Rehabilitative Medicine and Nursing Care. She is currently max assist overall. We will admit to Acute Inpatient Rehabilitation today.  Preadmission Screen Completed By:  Cleatrice Burke, 08/30/2017 3:49 PM ______________________________________________________________________   Discussed status with Dr. Posey Pronto on 08/30/2017 at  1552 nd received  telephone approval for admission today.  Admission Coordinator:  Cleatrice Burke, time 3234 Date 08/30/2017             Cosigned by: Jamse Arn, MD at 08/30/2017 3:56 PM  Revision History

## 2017-08-30 NOTE — Progress Notes (Signed)
I met with pt at bedside with her spouse. He is requesting readmit to inpt rehab. Noted PT and OT eval over the weekend. Spouse is now on FMLA as he realizes that she will need 24/7 physical care due to her current functional levels. I discussed with pt's Nurse and I will contact Dr. Hulda Humphrey for assessment to possibly readmit. [phone]

## 2017-08-31 ENCOUNTER — Inpatient Hospital Stay (HOSPITAL_COMMUNITY): Payer: Medicare Other | Admitting: Occupational Therapy

## 2017-08-31 ENCOUNTER — Inpatient Hospital Stay (HOSPITAL_COMMUNITY): Payer: Medicare Other

## 2017-08-31 ENCOUNTER — Inpatient Hospital Stay (HOSPITAL_COMMUNITY): Payer: Medicare Other | Admitting: Speech Pathology

## 2017-08-31 DIAGNOSIS — S065X0S Traumatic subdural hemorrhage without loss of consciousness, sequela: Secondary | ICD-10-CM

## 2017-08-31 DIAGNOSIS — D72829 Elevated white blood cell count, unspecified: Secondary | ICD-10-CM

## 2017-08-31 DIAGNOSIS — Z992 Dependence on renal dialysis: Secondary | ICD-10-CM

## 2017-08-31 DIAGNOSIS — N186 End stage renal disease: Secondary | ICD-10-CM

## 2017-08-31 DIAGNOSIS — D62 Acute posthemorrhagic anemia: Secondary | ICD-10-CM

## 2017-08-31 DIAGNOSIS — E119 Type 2 diabetes mellitus without complications: Secondary | ICD-10-CM

## 2017-08-31 DIAGNOSIS — I10 Essential (primary) hypertension: Secondary | ICD-10-CM

## 2017-08-31 DIAGNOSIS — D638 Anemia in other chronic diseases classified elsewhere: Secondary | ICD-10-CM

## 2017-08-31 LAB — GLUCOSE, CAPILLARY
Glucose-Capillary: 110 mg/dL — ABNORMAL HIGH (ref 65–99)
Glucose-Capillary: 184 mg/dL — ABNORMAL HIGH (ref 65–99)
Glucose-Capillary: 161 mg/dL — ABNORMAL HIGH (ref 65–99)
Glucose-Capillary: 130 mg/dL — ABNORMAL HIGH (ref 65–99)

## 2017-08-31 NOTE — Progress Notes (Signed)
[facility] Progress Note  Subjective: no c/o today  Vitals:   08/31/17 0500 08/31/17 0805 08/31/17 0937 08/31/17 1100  BP: (!) 142/75 121/88 121/88 (!) 197/81  Pulse: 74 78  75  Resp: 16 18    Temp: 98.1 F (36.7 C) 97.6 F (36.4 C)    TempSrc: Oral Oral    SpO2: 100% 100%    Weight: 68.2 kg (150 lb 5.7 oz)       Inpatient medications: . cloNIDine  0.2 mg Oral BID  . [START ON 09/02/2017] darbepoetin (ARANESP) injection - DIALYSIS  150 mcg Intravenous Q Thu-HD  . feeding supplement (PRO-STAT SUGAR FREE 64)  30 mL Oral BID  . ferric citrate  420 mg Oral TID WC  . gabapentin  100 mg Oral QHS  . hydrALAZINE  25 mg Oral 2 times per day on Sun Tue Thu Sat  . hydrocerin   Topical BID  . mouth rinse  15 mL Mouth Rinse BID  . mometasone-formoterol  2 puff Inhalation BID  . pantoprazole (PROTONIX) IV  40 mg Intravenous Daily  . polyethylene glycol  17 g Oral Daily   . [START ON 09/01/2017] ferric gluconate (FERRLECIT/NULECIT) IV     acetaminophen, albuterol, aluminum hydroxide, bisacodyl, cloNIDine, diphenhydrAMINE, fluticasone, guaiFENesin-dextromethorphan, HYDROcodone-acetaminophen, naLOXone (NARCAN)  injection, polyethylene glycol, prochlorperazine **OR** prochlorperazine **OR** prochlorperazine, simethicone, sodium phosphate, traZODone  Exam: Alert, head wound clearn and dry,  alert, nad No jvd Chest ctab RRR Abd soft ntnd R grt toe amp R groin wound some drainage noted LUA AVG+bruit  Dialysis: [facility] 4h  66.5kg  3K/2.25 bath  LUA AVG  P4  Hep 2500+ 1000 mid (holding after SDH)  Venofer 50mg  IV qwk - last 1/11 Mircera 69mcg IV q2wks - last 1/09       Impression: 1  PAD sp R fem-pop 2  SDH sp craniotomy 3  ESRD HD mwf 4  Anemia ckd, darbe 150 here, fe load 5  HTN stable 6  DM bs's good, minimal use of SSI 7  CKD MBD, resumed velphoro   Plan - HD Gretta Arab MD [facility] pager [phone]   08/31/2017, 1:15 PM    Recent Labs  Lab 08/28/17 0413 08/29/17 0326 08/30/17 1206  NA 136 136 137  K 3.9 3.5 3.8  CL 97* 96* 96*  CO2 22 26 25   GLUCOSE 91 95 84  BUN 38* 17 5*  CREATININE 5.29* 3.22* 1.78*  CALCIUM 8.6* 8.5* 8.5*  PHOS 5.1* 4.1 2.2*   Recent Labs  Lab 08/28/17 0413 08/29/17 0326 08/30/17 1206  ALBUMIN 2.1* 2.1* 2.4*   Recent Labs  Lab 08/26/17 0404 08/28/17 0414 08/30/17 1206  WBC 24.8* 13.3* 15.3*  HGB 8.6* 8.9* 10.3*  HCT 26.4* 28.1* 32.4*  MCV 95.7 98.9 100.6*  PLT 497* 471* 428*   Iron/TIBC/Ferritin/ %Sat    Component Value Date/Time   IRON 31 08/28/2017 0414   TIBC 140 (L) 08/28/2017 0414   FERRITIN 1,854 (H) 08/28/2017 0414   IRONPCTSAT 22 08/28/2017 0414

## 2017-08-31 NOTE — Consult Note (Signed)
WOC consult requested for groin wound.  Vascular team is following for assessment and plan of care; site was debrided of fat necrosis yesterday, according to the EMR.  Vascular team notes; "This was packed with normal saline soaked gauze and will write for twice a day normal saline wet-to-dry to this open area.  Following with you." Topical treatment orders have been provided for bedside nurses.  Please refer to vascular team for further questions. Please re-consult if further assistance is needed.  Thank-you,  Julien Girt MSN, [place], [place], [place], [place]

## 2017-08-31 NOTE — Progress Notes (Signed)
Destiny Day     PROGRESS NOTE  Subjective/Complaints:  Pt seen sitting up in bed this AM.  She states she slept well overnight.  She is ready to being therapies, but feels that she has to restart everything.  ROS: Denies CP, SOB, N/V/D.  Objective: Vital Signs: Blood pressure 121/88, pulse 74, temperature 98.1 F (36.7 C), temperature source Oral, resp. rate 16, weight 68.2 kg (150 lb 5.7 oz), SpO2 100 %. No results found. Recent Labs    08/30/17 1206  WBC 15.3*  HGB 10.3*  HCT 32.4*  PLT 428*   Recent Labs    08/29/17 0326 08/30/17 1206  NA 136 137  K 3.5 3.8  CL 96* 96*  GLUCOSE 95 84  BUN 17 5*  CREATININE 3.22* 1.78*  CALCIUM 8.5* 8.5*   CBG (last 3)  Recent Labs    08/30/17 1629 08/30/17 2202 08/31/17 0636  GLUCAP 107* 182* 110*    Wt Readings from Last 3 Encounters:  08/31/17 68.2 kg (150 lb 5.7 oz)  08/30/17 69 kg (152 lb 1.9 oz)  08/23/17 69.2 kg (152 lb 8.9 oz)    Physical Exam:  BP 121/88   Pulse 74   Temp 98.1 F (36.7 C) (Oral)   Resp 16   Wt 68.2 kg (150 lb 5.7 oz)   SpO2 100%   BMI 25.02 kg/m  Constitutional: She appears well-developed and well-nourished. No distress.  HENT: Staples c/d/i Eyes: EOM are normal. No discharge.  Cardiovascular: Normal rate, regular rhythm. No JVD. Respiratory: Effort normal and breath sounds normal.  GI: Bowel sounds are normal. She exhibits no distension.  Musculoskeletal:  Right great toe amputation  Left transmetatarsal   Neurological: She is alert.  Speech clear.  Able to follow simple motor commands without difficulty. A&O3.  Motor: RUE/RLE: 4-/5 proximal to distal LUE/LLE: 4+/5 proximal to distal  Skin: Skin is warm and dry. She is not diaphoretic.  Right great toe amputation site dusky  Left transmetatarsal site well healed.  Right groin with dressing c/d/i Psychiatric: Normal mood and behavior.   Assessment/Plan: 1. 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 Physiatrist is providing close team supervision and 24 hour management of active medical problems listed below. Physiatrist and rehab team continue to assess barriers to discharge/monitor patient progress toward functional and medical goals.  Function:  Bathing Bathing position      Bathing parts      Bathing assist        Upper Body Dressing/Undressing Upper body dressing                    Upper body assist        Lower Body Dressing/Undressing Lower body dressing                                  Lower body assist        Toileting Toileting          Toileting assist     Transfers Chair/bed transfer             Locomotion Ambulation           Wheelchair          Cognition Comprehension    Expression    Social Interaction  Problem Solving    Memory      Medical Problem List and Plan: 1.  Generalized weakness, decreased balance as well as issues with delayed processing secondary to TBI with left SDH with evacuation on 1/30.   Begin CIR 2.  DVT Prophylaxis/Anticoagulation: Mechanical: Sequential compression devices, below knee Bilateral lower extremities 3. Pain Management: Limit use of hydrocodone as confusion reported.  4. Mood: LCSW to follow for evaluation and support.  5. Neuropsych: This patient is not capable of making decisions on her own behalf. 6. Skin/Wound Care: Monitor wound for healing.  7. Fluids/Electrolytes/Nutrition: Monitor I/O. Renal diet with 1200 FR.  8. ESRD: HD MWF at end of the day to help with tolerance of therapy 9. PAD/Right femoral to popliteal bypass: Wet to dry dressing to right groin wound bid.  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   10 T2DM: Monitor BS ac/hs. Continue to use SSI for elevated BS.   Levemir on hold currently due to poor po intake.    Added  nephro with meals.    Monitor with increased mobility 11. HTN: Monitor BP bid   Continue hydralazine and catapres   Monitor with increased mobility 12. Neuropathy RLE: On gabapentin.  13. Acute on chronic anemia: On Nulecit IV with HD for iron loading and aranesp weekly.    Hb 10.3 on 2/4   Cont to monitor   Labs with HD 14. COPD: Encourage IS with flutter valve. Continue Dulera.  15. Seizure prophylaxis: On keppra tid. 16. Leukocytosis   WBCx 15.3 on 2/4   Cont to monitor  LOS (Days) 1 A FACE TO FACE EVALUATION WAS PERFORMED  Destiny Day Lorie Phenix 08/31/2017 10:06 AM

## 2017-08-31 NOTE — Plan of Care (Signed)
  Consults [facility] BRAIN INJURY PATIENT EDUCATION Description Description: See Patient Education module for eduction specifics 08/31/2017 1801 - Progressing by Dietrich Pates, RN   RH SKIN INTEGRITY RH STG SKIN FREE OF INFECTION/BREAKDOWN Description Patient and family will be able to verbalize how to keep skin free of breakdown prior to discharge  08/31/2017 1801 - Progressing by Dietrich Pates, RN RH STG MAINTAIN SKIN INTEGRITY WITH ASSISTANCE Description STG Maintain Skin Integrity With [place].  08/31/2017 1801 - Progressing by Dietrich Pates, RN   RH SAFETY RH STG ADHERE TO SAFETY PRECAUTIONS W/ASSISTANCE/DEVICE Description STG Adhere to Safety Precautions With Min, Assistance/Device.  08/31/2017 1801 - Progressing by Dietrich Pates, RN

## 2017-08-31 NOTE — Evaluation (Signed)
Speech Language Pathology Assessment and Plan  Patient Details  Name: Destiny Day MRN: [id number] Date of Birth: [birth year]  SLP Diagnosis: Cognitive Impairments;Dysphagia  Rehab Potential: Good ELOS: 18-21 days    Today's Date: 08/31/2017 SLP Individual Time: 1530-1630 SLP Individual Time Calculation (min): 60 min   Problem List:  Patient Active Problem List   Diagnosis Date Noted  . Subdural hemorrhage following injury ([place]) 08/30/2017  . Diabetic peripheral neuropathy ([place])   . Chronic obstructive pulmonary disease ([place])   . Seizure prophylaxis   . Subdural hematoma ([place]) 08/24/2017  . Traumatic subdural hematoma ([place]) 08/23/2017  . Fall   . SDH (subdural hematoma) ([place])   . Acute respiratory failure with hypoxia ([place])   . Diabetes mellitus type 2 in nonobese (HCC)   . Leukocytosis   . Labile blood pressure   . Generalized anxiety disorder   . Debility 08/16/2017  . Amputated great toe of right foot ([place])   . Amputated toe of left foot ([place])   . Post-operative pain   . ESRD on dialysis ([place])   . Diabetes mellitus ([place])   . Anxiety state   . Benign essential HTN   . Acute blood loss anemia   . Anemia of chronic disease   . Idiopathic chronic venous hypertension of both lower extremities with inflammation 10/13/2016  . S/P transmetatarsal amputation of foot, left ([place]) 09/21/2016  . Onychomycosis 08/15/2016  . Gangrene of toe of left foot ([place])   . PAD (peripheral artery disease) ([place]) 07/08/2016  . Atherosclerosis of native artery of left lower extremity with gangrene ([place]) 06/23/2016  . GERD (gastroesophageal reflux disease) 10/07/2015  . ARF (acute renal failure) ([place])   . Hypothermia 06/12/2015  . Acute on chronic renal failure ([place]) 06/12/2015  . CHF (congestive heart failure) ([place]) 07/30/2014  . CKD (chronic kidney disease) stage 4, GFR 15-29 ml/min (HCC) 06/27/2014  . Hypertensive heart disease 05/25/2013  . CKD (chronic kidney disease) stage 3, GFR  30-59 ml/min (HCC) 05/25/2013  . Pulmonary edema 05/23/2013  . Type 2 diabetes mellitus with diabetic nephropathy ([place]) 05/23/2013  . Type 2 diabetes mellitus with hyperosmolar nonketotic hyperglycemia ([place]) 04/13/2013  . Chronic diastolic CHF (congestive heart failure) ([place]) 04/13/2013  . UGI bleed 03/31/2013  . Hypokalemia 08/24/2012  . Type II or unspecified type diabetes mellitus without mention of complication, not stated as uncontrolled 12/27/2007  . HLD (hyperlipidemia) 12/27/2007  . ALLERGIC RHINITIS 12/27/2007  . Asthma 12/27/2007   Past Medical History:  Past Medical History:  Diagnosis Date  . Abdominal bruit   . Anemia   . Anxiety   . Arthritis    Osteoarthritis  . Asthma   . Cervical disc disease    "pinced nerve"  . CHF (congestive heart failure) ([place])   . Complication of anesthesia    " after I got home from my last procedure, I started itching."  . Diabetes mellitus    Type II  . Diverticulitis   . ESRD on peritoneal dialysis [facility])    Hemodialysis - MWF- [place]   . GERD (gastroesophageal reflux disease)    from medications  . GI bleed 03/31/2013  . History of hiatal hernia   . Hyperlipidemia   . Hypertension   . Neuropathy    left leg  . Osteoporosis   . Peripheral vascular disease ([place])   . Pneumonia    "very young"  . Seasonal allergies   . Shortness of breath dyspnea    WIth exertion  .  Sleep apnea    can't afford cpap   Past Surgical History:  Past Surgical History:  Procedure Laterality Date  . A/V SHUNTOGRAM N/A 09/22/2016   Procedure: A/V Shuntogram - left arm;  Surgeon: Serafina Mitchell, MD;  Location: [place] CV LAB;  Service: Cardiovascular;  Laterality: N/A;  . ABDOMINAL HYSTERECTOMY  1993`  . AMPUTATION Left 09/01/2016   Procedure: LEFT FOOT TRANSMETATARSAL AMPUTATION;  Surgeon: Newt Minion, MD;  Location: [place];  Service: Orthopedics;  Laterality: Left;  . AMPUTATION Right 08/11/2017   Procedure: RIGHT GREAT TOE AMPUTATION DIGIT;   Surgeon: Rosetta Posner, MD;  [facility];  Service: Vascular;  Laterality: Right;  . AV FISTULA PLACEMENT Left 04/21/2016   Procedure: INSERTION OF ARTERIOVENOUS (AV) GORE-TEX GRAFT ARM LEFT;  Surgeon: Elam Dutch, MD;  Location: [place];  Service: Vascular;  Laterality: Left;  . [place] TRANSPOSITION Left 07/10/2014   Procedure: BASCILIC VEIN TRANSPOSITION;  Surgeon: Angelia Mould, MD;  Location: [place];  Service: Vascular;  Laterality: Left;  . BASCILIC VEIN TRANSPOSITION Right 11/08/2014   Procedure: FIRST STAGE BASILIC VEIN TRANSPOSITION;  Surgeon: Angelia Mould, MD;  Location: [place];  Service: Vascular;  Laterality: Right;  . BASCILIC VEIN TRANSPOSITION Right 01/18/2015   Procedure: SECOND STAGE BASILIC VEIN TRANSPOSITION;  Surgeon: Angelia Mould, MD;  Location: [place];  Service: Vascular;  Laterality: Right;  . CRANIOTOMY N/A 08/23/2017   Procedure: CRANIOTOMY HEMATOMA EVACUATION SUBDURAL;  Surgeon: Ashok Pall, MD;  Location: [place];  Service: Neurosurgery;  Laterality: N/A;  . CRANIOTOMY Left 08/24/2017   Procedure: CRANIOTOMY FOR RECURRENT ACUTE SUBDURAL HEMATOMA;  Surgeon: Ashok Pall, MD;  Location: [place];  Service: Neurosurgery;  Laterality: Left;  . ESOPHAGOGASTRODUODENOSCOPY N/A 03/31/2013   Procedure: ESOPHAGOGASTRODUODENOSCOPY (EGD);  Surgeon: Gatha Mayer, MD;  Location[facility] ENDOSCOPY;  Service: Endoscopy;  Laterality: N/A;  . EYE SURGERY     laser surgery  . FEMORAL-POPLITEAL BYPASS GRAFT Left 07/08/2016   Procedure: LEFT  FEMORAL-BELOW KNEE POPLITEAL ARTERY BYPASS GRAFT USING 6MM X 80 CM PROPATEN GORETEX GRAFT WITH RINGS.;  Surgeon: Rosetta Posner, MD;  Location: [place];  Service: Vascular;  Laterality: Left;  . FEMORAL-POPLITEAL BYPASS GRAFT Right 08/11/2017   Procedure: RIGHT FEMORAL TO BELOW KNEE POPLITEAL ARTERKY  BYPASS GRAFT USING 6MM RINGED PROPATEN GRAFT;  Surgeon: Rosetta Posner, MD;  Location: [place];  Service: Vascular;  Laterality: Right;   . FISTULOGRAM Left 10/29/2014   Procedure: FISTULOGRAM;  Surgeon: Angelia Mould, MD;  Location: [facility] CATH LAB;  Service: Cardiovascular;  Laterality: Left;  . LIGATION OF ARTERIOVENOUS  FISTULA Left 04/21/2016   Procedure: LIGATION OF ARTERIOVENOUS  FISTULA LEFT ARM;  Surgeon: Elam Dutch, MD;  Location: [place];  Service: Vascular;  Laterality: Left;  . LOWER EXTREMITY ANGIOGRAPHY N/A 04/06/2017   Procedure: Lower Extremity Angiography - Right;  Surgeon: Serafina Mitchell, MD;  Location: [place] CV LAB;  Service: Cardiovascular;  Laterality: N/A;  . PATCH ANGIOPLASTY Right 01/18/2015   Procedure: BASILIC VEIN PATCH ANGIOPLASTY USING VASCUGUARD PATCH;  Surgeon: Angelia Mould, MD;  Location: [place];  Service: Vascular;  Laterality: Right;  . PERIPHERAL VASCULAR BALLOON ANGIOPLASTY  09/22/2016   Procedure: Peripheral Vascular Balloon Angioplasty;  Surgeon: Serafina Mitchell, MD;  Location: [place] [facility] LAB;  Service: Cardiovascular;;  Lt. Fistula  . PERIPHERAL VASCULAR CATHETERIZATION N/A 06/23/2016   Procedure: Abdominal Aortogram w/Lower Extremity;  Surgeon: Serafina Mitchell, MD;  Location: [place] CV LAB;  Service: Cardiovascular;  Laterality: N/A;  . PERIPHERAL VASCULAR CATHETERIZATION  06/23/2016   Procedure: Peripheral Vascular Intervention;  Surgeon: Serafina Mitchell, MD;  Location: [place] CV LAB;  Service: Cardiovascular;;  lt common and external illiac artery    Assessment / Plan / Recommendation Clinical Impression SHARNELL KNIGHT is a 67 y.o. female with history of ESRD- HD, diastolic congestive heart failure, diabetes mellitus, HTN, PVD s/p 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 History taken from chart review and husband. She was admitted on 08/11/17 with ischemic right foot with gangrenous changes and underwent right femoral to below knee BG and right great toe amputation by Dr. Donnetta Hutching. Post op WBAT and was admitted to rehab 08/16/2017 for inpatient  therapies to consist of PT and OT at least three hours five days a week.  She was medically stable and right great toe amputation site as well as RLE bypass incisions were healing well. Reactive leucocytosis was resolving and ABLA was stable. She was progressing to modified independent level but on 08/23/17 she sustained a fall. CT ordered and reviewed, showing left SDH. Per report, large left SDH. She had a decline in MS. She was taken to OR emergently for left craniotomy the same day by Dr. Christella Noa. She developed recurrent acute SDH requiring surgical evacuation on 01/30. hospital course significant for labile BP requiring Cardene drip, hyponatremia as well as VDRF. She tolerated extubation on 01/31 and mentation improving with occasional bouts of confusion. Dr. Donnetta Hutching following for input and right groin wound noted to have fat necrosis which was debrided at bedside today with recommendations for wet to dry dressing changes. Therapy evaluations done and CIR recommended due to generalized weakness, decreased balance as well as issues with delayed processing.  Pt admitted to CIR on 08/30/2017 and evaluated for swallow and cognitives linguistic skills on 08/31/2017. Pt presents with mild oral dysphagia, likely due to missing dentition. Pt demonstrated no overt s/s aspiration on trials if dys 3 and thin liquid via straw and cup sips with oral motor function within functional limits. Pt refused to consumed dys 1 and dys 2 trials, however given dys 3 pt demonstrated prolonged and effortful mastication, indicating dys 2 as recommend diet at this time due to oral deficits seeming to be caused by missing dentition. Pt is suspected to consume dys 3 at baseline based on food she described. Pt presents with severe-moderate impairment in cognitive linguistic skills including auditory comprehension, mildly complex problem solving, sustained attention, intellectual awareness and recall of novel information, which is further  supported by a score of 17 out 30 on MOCA version 7.1 (n=>26.) Pt would benefit from skilled ST services in order to maximize functional independence and reduce burden of care.   Skilled Therapeutic Interventions          Skilled ST services focused on cognitive skills. SLP facilitated auditory comprehension skills providing mildly complex [place] Q and simple commands during functional task of explaining MOCA results, pt required Mod A repetition cues due to delayed process. Pt required Mod a verbal and visual cues for intellectual awareness, demonstrating understanding of some physical impairments but not cognitive impairments. Pt was left in room with husband. Recommend to continue skilled ST services.   SLP Assessment  Patient will need skilled Speech Lanaguage Pathology Services during CIR admission    Recommendations  SLP Diet Recommendations: Thin;Dysphagia 2 (Fine chop) Liquid Administration via: Straw;Cup Medication Administration: Whole meds with liquid Supervision: Patient able to self  feed;Intermittent supervision to cue for compensatory strategies Compensations: Minimize environmental distractions;Small sips/bites Postural Changes and/or Swallow Maneuvers: Seated upright 90 degrees Oral Care Recommendations: Oral care BID Recommendations for Other Services: Neuropsych consult Patient destination: Home Follow up Recommendations: Home Health SLP;Outpatient SLP Equipment Recommended: None recommended by SLP    SLP Frequency 3 to 5 out of 7 days   SLP Duration  SLP Intensity  SLP Treatment/Interventions 18-21 days  Minumum of 1-2 x/day, 30 to 90 minutes  Cognitive remediation/compensation;Cueing hierarchy;Dysphagia/aspiration precaution training;Functional tasks    Pain Pain Assessment Pain Assessment: No/denies pain  Prior Functioning Cognitive/Linguistic Baseline: Within functional limits Type of Home: House  Lives With: Spouse;Other (Comment)(grandson 37 years old  ) Available Help at Discharge: Family;Available 24 hours/day Vocation: Retired  Function:  Eating Eating   Modified Consistency Diet: Yes(dys 3) Eating Assist Level: Set up assist for;Supervision or verbal cues;More than reasonable amount of time;Helper checks for pocketed food   Eating Set Up Assist For: Opening containers       Cognition Comprehension Comprehension assist level: Understands basic 75 - 89% of the time/ requires cueing 10 - 24% of the time  Expression   Expression assist level: Expresses basic needs/ideas: With extra time/assistive device  Social Interaction Social Interaction assist level: Interacts appropriately 90% of the time - Needs monitoring or encouragement for participation or interaction.  Problem Solving Problem solving assist level: Solves basic 75 - 89% of the time/requires cueing 10 - 24% of the time  Memory Memory assist level: Recognizes or recalls 90% of the time/requires cueing < 10% of the time   Short Term Goals: Week 1: SLP Short Term Goal 1 (Week 1): Pt will tolerate dys 3 textured trials with no overt s/s aspiration and efficent mastication/oral clearence over 3 observed sesions to demonstrate readiness for diet advancement. SLP Short Term Goal 2 (Week 1): Pt will demonstrate sustained attention to functional task for 10 minutes with Min A verbal cues for redirection. SLP Short Term Goal 3 (Week 1): Pt will identify 2 phsyical and 2 cognitive impairments given Min A verbal and question cues.  SLP Short Term Goal 4 (Week 1): Pt will demonstrate functional problem solving for basic, famlar (mildly complex) tasks with Mod A verbal cues.  SLP Short Term Goal 5 (Week 1): Pt will response verbal and functional to presented tasks with Min A repetition of instruction cues SLP Short Term Goal 6 (Week 1): Pt will recall novel and functional information with Mod A verbal assist for use of external aids.   Refer to Care Plan for Long Term  Goals  Recommendations for other services: Neuropsych  Discharge Criteria: Patient will be discharged from SLP if patient refuses treatment 3 consecutive times without medical reason, if treatment goals not met, if there is a change in medical status, if patient makes no progress towards goals or if patient is discharged from hospital.  The above assessment, treatment plan, treatment alternatives and goals were discussed and mutually agreed upon: by patient and by family  Dyneshia Baccam  [facility] 08/31/2017, 5:09 PM

## 2017-08-31 NOTE — Progress Notes (Signed)
Occupational Therapy Session Note  Patient Details  Name: Destiny Day MRN: [id number] Date of Birth: [birth year]  Today's Date: 08/31/2017 OT Individual Time: 1300-1330 OT Individual Time Calculation (min): 30 min    Short Term Goals: Week 1:  OT Short Term Goal 1 (Week 1): Pt will be able to sit to stand with min A of 1 person from w/c for LB self care. OT Short Term Goal 2 (Week 1): Pt will be able to don pants over feet with touching A. OT Short Term Goal 3 (Week 1): Pt will be able to transfer to [facility] or toilet with min A of 1. OT Short Term Goal 4 (Week 1): Pt will actively use her R hand with grooming activities with touching A.  Skilled Therapeutic Interventions/Progress Updates:  Actor;Functional mobility training;DME/adaptive equipment instruction;Patient/family education;Self Care/advanced ADL retraining;Therapeutic Activities;Therapeutic Exercise;UE/LE Strength taining/ROM;Cognitive remediation/compensation;Neuromuscular re-education;Psychosocial support;UE/LE Coordination activities  1:1 Focus on functional cognitive task of trying to call and text daughter about missing item she wanted. Pt required max A to complete task demonstrating decr initiation during task. Pt also perform tooth brushing with with mod A for initiation  for completion of task; also promoted use of right hand with functional task. Left pt eating yogurt due to not eating lunch.  Suggest meal with supervision due to decr initiation.   Therapy Documentation Precautions:  Precautions Precautions: Fall Required Braces or Orthoses: Other Brace/Splint Other Brace/Splint: Per rehab notes, pt had [place] shoe for Rt LE -pt stated she did not feel safe in [place] Restrictions RLE Weight Bearing: Weight bearing as tolerated   Pain: Pain Assessment Pain Assessment: No/denies pain ADL: ADL ADL Comments: refer to functional navigator  See Function Navigator for Current  Functional Status.   Therapy/Group: Individual Therapy  Willeen Cass [facility] 08/31/2017, 3:22 PM

## 2017-08-31 NOTE — Evaluation (Signed)
Physical Therapy Assessment and Plan  Patient Details  Name: Destiny Day MRN: [id number] Date of Birth: 25-Aug-1950  PT Diagnosis: Abnormality of gait, Difficulty walking and Muscle weakness Rehab Potential: Good ELOS: 2.5-3 weeks   Today's Date: 08/31/2017 PT Individual Time: 0930-1030 PT Individual Time Calculation (min): 60 min    Problem List:  Patient Active Problem List   Diagnosis Date Noted  . Subdural hemorrhage following injury ([place]) 08/30/2017  . Diabetic peripheral neuropathy ([place])   . Chronic obstructive pulmonary disease ([place])   . Seizure prophylaxis   . Subdural hematoma ([place]) 08/24/2017  . Traumatic subdural hematoma ([place]) 08/23/2017  . Fall   . SDH (subdural hematoma) ([place])   . Acute respiratory failure with hypoxia ([place])   . Diabetes mellitus type 2 in nonobese (HCC)   . Leukocytosis   . Labile blood pressure   . Generalized anxiety disorder   . Debility 08/16/2017  . Amputated great toe of right foot ([place])   . Amputated toe of left foot ([place])   . Post-operative pain   . ESRD on dialysis ([place])   . Diabetes mellitus ([place])   . Anxiety state   . Benign essential HTN   . Acute blood loss anemia   . Anemia of chronic disease   . Idiopathic chronic venous hypertension of both lower extremities with inflammation 10/13/2016  . S/P transmetatarsal amputation of foot, left ([place]) 09/21/2016  . Onychomycosis 08/15/2016  . Gangrene of toe of left foot ([place])   . PAD (peripheral artery disease) ([place]) 07/08/2016  . Atherosclerosis of native artery of left lower extremity with gangrene ([place]) 06/23/2016  . GERD (gastroesophageal reflux disease) 10/07/2015  . ARF (acute renal failure) ([place])   . Hypothermia 06/12/2015  . Acute on chronic renal failure ([place]) 06/12/2015  . CHF (congestive heart failure) ([place]) 07/30/2014  . CKD (chronic kidney disease) stage 4, GFR 15-29 ml/min (HCC) 06/27/2014  . Hypertensive heart disease 05/25/2013  . CKD (chronic kidney disease)  stage 3, GFR 30-59 ml/min (HCC) 05/25/2013  . Pulmonary edema 05/23/2013  . Type 2 diabetes mellitus with diabetic nephropathy ([place]) 05/23/2013  . Type 2 diabetes mellitus with hyperosmolar nonketotic hyperglycemia ([place]) 04/13/2013  . Chronic diastolic CHF (congestive heart failure) ([place]) 04/13/2013  . UGI bleed 03/31/2013  . Hypokalemia 08/24/2012  . Type II or unspecified type diabetes mellitus without mention of complication, not stated as uncontrolled 12/27/2007  . HLD (hyperlipidemia) 12/27/2007  . ALLERGIC RHINITIS 12/27/2007  . Asthma 12/27/2007    Past Medical History:  Past Medical History:  Diagnosis Date  . Abdominal bruit   . Anemia   . Anxiety   . Arthritis    Osteoarthritis  . Asthma   . Cervical disc disease    "pinced nerve"  . CHF (congestive heart failure) ([place])   . Complication of anesthesia    " after I got home from my last procedure, I started itching."  . Diabetes mellitus    Type II  . Diverticulitis   . ESRD on peritoneal dialysis [facility])    Hemodialysis - MWF- [place]   . GERD (gastroesophageal reflux disease)    from medications  . GI bleed 03/31/2013  . History of hiatal hernia   . Hyperlipidemia   . Hypertension   . Neuropathy    left leg  . Osteoporosis   . Peripheral vascular disease ([place])   . Pneumonia    "very young"  . Seasonal allergies   . Shortness of breath dyspnea  WIth exertion  . Sleep apnea    can't afford cpap   Past Surgical History:  Past Surgical History:  Procedure Laterality Date  . A/V SHUNTOGRAM N/A 09/22/2016   Procedure: A/V Shuntogram - left arm;  Surgeon: Serafina Mitchell, MD;  Location: [place] [facility];  Service: Cardiovascular;  Laterality: N/A;  . ABDOMINAL HYSTERECTOMY  1993`  . AMPUTATION Left 09/01/2016   Procedure: LEFT FOOT TRANSMETATARSAL AMPUTATION;  Surgeon: Newt Minion, MD;  Location: [place];  Service: Orthopedics;  Laterality: Left;  . AMPUTATION Right 08/11/2017   Procedure: RIGHT GREAT TOE  AMPUTATION DIGIT;  Surgeon: Rosetta Posner, MD;  Location: [facility];  Service: Vascular;  Laterality: Right;  . AV FISTULA PLACEMENT Left 04/21/2016   Procedure: INSERTION OF ARTERIOVENOUS (AV) GORE-TEX GRAFT ARM LEFT;  Surgeon: Elam Dutch, MD;  Location: [place];  Service: Vascular;  Laterality: Left;  . [place] TRANSPOSITION Left 07/10/2014   Procedure: BASCILIC VEIN TRANSPOSITION;  Surgeon: Angelia Mould, MD;  Location: [place];  Service: Vascular;  Laterality: Left;  . BASCILIC VEIN TRANSPOSITION Right 11/08/2014   Procedure: FIRST STAGE BASILIC VEIN TRANSPOSITION;  Surgeon: Angelia Mould, MD;  Location: [place];  Service: Vascular;  Laterality: Right;  . BASCILIC VEIN TRANSPOSITION Right 01/18/2015   Procedure: SECOND STAGE BASILIC VEIN TRANSPOSITION;  Surgeon: Angelia Mould, MD;  Location: [place];  Service: Vascular;  Laterality: Right;  . CRANIOTOMY N/A 08/23/2017   Procedure: CRANIOTOMY HEMATOMA EVACUATION SUBDURAL;  Surgeon: Ashok Pall, MD;  Location: [place];  Service: Neurosurgery;  Laterality: N/A;  . CRANIOTOMY Left 08/24/2017   Procedure: CRANIOTOMY FOR RECURRENT ACUTE SUBDURAL HEMATOMA;  Surgeon: Ashok Pall, MD;  Location: [place];  Service: Neurosurgery;  Laterality: Left;  . ESOPHAGOGASTRODUODENOSCOPY N/A 03/31/2013   Procedure: ESOPHAGOGASTRODUODENOSCOPY (EGD);  Surgeon: Gatha Mayer, MD;  Location[facility] ENDOSCOPY;  Service: Endoscopy;  Laterality: N/A;  . EYE SURGERY     laser surgery  . FEMORAL-POPLITEAL BYPASS GRAFT Left 07/08/2016   Procedure: LEFT  FEMORAL-BELOW KNEE POPLITEAL ARTERY BYPASS GRAFT USING 6MM X 80 CM PROPATEN GORETEX GRAFT WITH RINGS.;  Surgeon: Rosetta Posner, MD;  Location: [place];  Service: Vascular;  Laterality: Left;  . FEMORAL-POPLITEAL BYPASS GRAFT Right 08/11/2017   Procedure: RIGHT FEMORAL TO BELOW KNEE POPLITEAL ARTERKY  BYPASS GRAFT USING 6MM RINGED PROPATEN GRAFT;  Surgeon: Rosetta Posner, MD;  Location: [place];  Service: Vascular;   Laterality: Right;  . FISTULOGRAM Left 10/29/2014   Procedure: FISTULOGRAM;  Surgeon: Angelia Mould, MD;  [facility] C[facility] LAB;  Service: Cardiovascular;  Laterality: Left;  . LIGATION OF ARTERIOVENOUS  FISTULA Left 04/21/2016   Procedure: LIGATION OF ARTERIOVENOUS  FISTULA LEFT ARM;  Surgeon: Elam Dutch, MD;  Location: [place];  Service: Vascular;  Laterality: Left;  . LOWER EXTREMITY ANGIOGRAPHY N/A 04/06/2017   Procedure: Lower Extremity Angiography - Right;  Surgeon: Serafina Mitchell, MD;  Location: [place] [facility];  Service: Cardiovascular;  Laterality: N/A;  . PATCH ANGIOPLASTY Right 01/18/2015   Procedure: BASILIC VEIN PATCH ANGIOPLASTY USING VASCUGUARD PATCH;  Surgeon: Angelia Mould, MD;  Location: [place];  Service: Vascular;  Laterality: Right;  . PERIPHERAL VASCULAR BALLOON ANGIOPLASTY  09/22/2016   Procedure: Peripheral Vascular Balloon Angioplasty;  Surgeon: Serafina Mitchell, MD;  Location: [place] [facility];  Service: Cardiovascular;;  Lt. Fistula  . PERIPHERAL VASCULAR CATHETERIZATION N/A 06/23/2016   Procedure: Abdominal Aortogram w/Lower Extremity;  Surgeon: Durene Fruits  Pierre Bali, MD;  Location: [place] [facility];  Service: Cardiovascular;  Laterality: N/A;  . PERIPHERAL VASCULAR CATHETERIZATION  06/23/2016   Procedure: Peripheral Vascular Intervention;  Surgeon: Serafina Mitchell, MD;  Location: [place] [facility];  Service: Cardiovascular;;  lt common and external illiac artery    Assessment & Plan Clinical Impression:Maggy A McAdoois a 67 y.o.femalewith history of ESRD- HD, diastolic congestive heart failure, diabetes mellitus, HTN, PVD s/p 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 She was admitted on 08/11/17 with ischemic right foot with gangrenous changes and underwent right femoral to below knee BG and right great toe amputation by Dr. Donnetta Hutching. Post op WBAT and was admitted to rehab 1/21/2019for inpatient therapies.  She was progressing to modified  independent level but on 08/23/17 she sustained a fall. CT ordered and reviewed, showing left SDH.  Per report, large left SDH.  She had a decline in MS. She was taken to OR emergently for left craniotomy the same day by Dr. Christella Noa. She developed recurrent acute SDH requiring surgical evacuation on 01/30. hospital course significant for labile BP requiring Cardene drip, hyponatremia as well as VDRF. She tolerated extubation on 01/31 and mentation improving with occasional bouts of confusion. Dr. Donnetta Hutching following for input and right groin wound noted to have fat necrosis which was debrided at bedside today with recommendations for wet to dry dressing changes. Therapy evaluations done and CIR recommended due to generalized weakness, decreased balance as well as issues with delayed processing.    Patient transferred to CIR on 08/30/2017 .   Patient currently requires max with mobility secondary to muscle weakness, decreased endurance, and general debility.  Prior to hospitalization, patient was independent  with mobility and lived with Spouse, Other (Comment) in a House home.  Home access is 2Stairs to enter.  Patient will benefit from skilled PT intervention to maximize safe functional mobility and minimize fall risk for planned discharge home with 24 hour supervision.  Anticipate patient will benefit from follow up [place] at discharge.  PT - End of Session Activity Tolerance: Tolerates 30+ min activity with multiple rests Endurance Deficit: Yes Endurance Deficit Description: fatigues quickly PT Assessment Rehab Potential (ACUTE/IP ONLY): Good PT Barriers to Discharge: Inaccessible home environment PT Barriers to Discharge Comments: two level home PT Patient demonstrates impairments in the following area(s): Balance;Endurance;Motor;Safety PT Transfers Functional Problem(s): Bed to Chair;Bed Mobility;Car;Furniture PT Locomotion Functional Problem(s): Wheelchair Mobility;Ambulation;Stairs PT Plan PT  Intensity: Minimum of 1-2 x/day ,45 to 90 minutes PT Frequency: 5 out of 7 days PT Duration Estimated Length of Stay: 2.5-3 weeks PT Treatment/Interventions: Ambulation/gait training;DME/adaptive equipment instruction;Neuromuscular re-education;Stair training;UE/LE Strength taining/ROM;Wheelchair propulsion/positioning;Therapeutic Activities;Balance/vestibular training;Functional mobility training;Patient/family education;Therapeutic Exercise PT Transfers Anticipated Outcome(s): supervision PT Locomotion Anticipated Outcome(s): supervision w/ RW PT Recommendation Follow Up Recommendations: Home health PT;24 hour supervision/assistance Patient destination: Home Equipment Recommended: To be determined  Skilled Therapeutic Intervention Patient in supine agreeable to PT evaluation.  Supine to sit mod A for R LE and trunk with cues for technique.  Patient sitting balance with S and UE support.  Patient scooted to EOB with S and increased time.  Sit to stand from elevated surface with max A for lifting hips and cues for hand placement and to extend trunk to come upright.  Stood several seconds prior to taking pivotal steps to w/c with RW and mod A for balance, safety and walker management.  In w/c assisted to sink and stood second time with UE support on sink (leaning over) for perineal hygiene  x about 70mnutes, then cues and assist to come upright prior to returning to sit.  Patient propelled w/c in hallway 50' min A with cues for keeping from running into obstacles on R hand side.  Patient in w/c with quick release belt and call bell to await OT session.    PT Evaluation Precautions/Restrictions Precautions Precautions: Fall Required Braces or Orthoses: Other Brace/Splint Other Brace/Splint: Per rehab notes, pt had [place] shoe for Rt LE  Restrictions RLE Weight Bearing: Weight bearing as tolerated General   Vital SignsTherapy Vitals Pulse Rate: 75 BP: (!) 197/81(RN notified, working with PT  currently) Oxygen Therapy O2 Device: Not Delivered Pain Pain Assessment Faces Pain Scale: No hurt Home Living/Prior Functioning Home Living Available Help at Discharge: Family;Available 24 hours/day Type of Home: House Home Access: Stairs to enter ECenterPoint Energyof Steps: 2 Entrance Stairs-Rails: Left Home Layout: Multi-level;Bed/bath upstairs Alternate Level Stairs-Number of Steps: 6 and 6 level Alternate Level Stairs-Rails: Left Bathroom Shower/Tub: Tub/shower unit Additional Comments: tub bench,, grabbar in shower, SPC, 2 rollators, RW  Lives With: Spouse;Other (Comment) Prior Function  Able to Take Stairs?: Yes Driving: Yes Vocation: Retired Comments: walked with cane Vision/Perception     Cognition Arousal/Alertness: Awake/alert Orientation Level: Oriented X4 Sensation Sensation Light Touch: Impaired Detail Light Touch Impaired Details: Impaired LLE;Impaired LUE;Impaired RUE(neuropathy, intermittent tingling in fingers) Stereognosis: Appears Intact Hot/Cold: Appears Intact Proprioception: Appears Intact Additional Comments: L foot neuropathy Motor  Motor Motor - Skilled Clinical Observations: generalized motor weakness  Mobility Bed Mobility Bed Mobility: Supine to Sit Supine to Sit: 3: Mod assist;HOB elevated;With rails Supine to Sit Details: Verbal cues for technique;Verbal cues for safe use of DME/AE;Verbal cues for precautions/safety;Verbal cues for sequencing Supine to Sit Details (indicate cue type and reason): assist for R leg off bed and to lift trunk with pt pushing up on rail Transfers Transfers: Yes Sit to Stand: 2: Max assist Sit to Stand Details: Verbal cues for technique;Verbal cues for precautions/safety;Manual facilitation for placement;Verbal cues for safe use of DME/AE Sit to Stand Details (indicate cue type and reason): increased time, assist to lift hips into standing and cues for hand placement Stand Pivot Transfers: 3: Mod  assist Stand Pivot Transfer Details: Verbal cues for safe use of DME/AE;Verbal cues for precautions/safety;Manual facilitation for placement;Verbal cues for technique Stand Pivot Transfer Details (indicate cue type and reason): with RW and increased time able to stand and step to w/c with mod A for balance and walker management and cues for safety with backing all the way up to chair Locomotion  Ambulation Ambulation: No Gait Gait: No Stairs / Additional Locomotion Stairs: No Wheelchair Mobility Wheelchair Mobility: Yes Wheelchair Assistance: 4: MAdvertising account executiveDetails: Verbal cues for safe use of DME/AE;Verbal cues for technique Wheelchair Propulsion: Both upper extremities Wheelchair Parts Management: Needs assistance Distance: 50'  (R UE weaker and w/c tends to veer R so assist to keep w/c straight and cues to correct)  Trunk/Postural Assessment  Cervical Assessment Cervical Assessment: Within Functional Limits Thoracic Assessment Thoracic Assessment: Within Functional Limits Lumbar Assessment Lumbar Assessment: Within Functional Limits Postural Control Postural Control: Deficits on evaluation(delayed postural reactions )  Balance Balance Balance Assessed: Yes Static Sitting Balance Static Sitting - Level of Assistance: 5: Stand by assistance Static Sitting - Comment/# of Minutes: UE support on EOB x 4 minutes Static Standing Balance Static Standing - Level of Assistance: 3: Mod assist Static Standing - Comment/# of Minutes: stood for hygiene at sink with mod A x  2 minutes Extremity Assessment      RLE Assessment RLE Assessment: Exceptions to [facility] RLE Strength RLE Overall Strength Comments: AROM WFL except ankle DF with transmet amputation, strength hip flexion 2/5, knee extension 3+/5, ankle DF 2+/5     See Function Navigator for Current Functional Status.   Refer to Care Plan for Long Term Goals  Recommendations for other services: None    Discharge Criteria: Patient will be discharged from PT if patient refuses treatment 3 consecutive times without medical reason, if treatment goals not met, if there is a change in medical status, if patient makes no progress towards goals or if patient is discharged from hospital.  The above assessment, treatment plan, treatment alternatives and goals were discussed and mutually agreed upon: by patient  Reginia Naas 08/31/2017, 12:47 PM

## 2017-08-31 NOTE — Evaluation (Signed)
Occupational Therapy Assessment and Plan  Patient Details  Name: Destiny Day MRN: [id number] Date of Birth: 1950-12-05  OT Diagnosis: cognitive deficits, hemiplegia affecting dominant side and muscle weakness (generalized) Rehab Potential: Rehab Potential (ACUTE ONLY): Good ELOS: 18-21 days   Today's Date: 08/31/2017 OT Individual Time: 9892-1194 OT Individual Time Calculation (min): 60 min     Problem List:  Patient Active Problem List   Diagnosis Date Noted  . Subdural hemorrhage following injury ([place]) 08/30/2017  . Diabetic peripheral neuropathy ([place])   . Chronic obstructive pulmonary disease ([place])   . Seizure prophylaxis   . Subdural hematoma ([place]) 08/24/2017  . Traumatic subdural hematoma ([place]) 08/23/2017  . Fall   . SDH (subdural hematoma) ([place])   . Acute respiratory failure with hypoxia ([place])   . Diabetes mellitus type 2 in nonobese (HCC)   . Leukocytosis   . Labile blood pressure   . Generalized anxiety disorder   . Debility 08/16/2017  . Amputated great toe of right foot ([place])   . Amputated toe of left foot ([place])   . Post-operative pain   . ESRD on dialysis ([place])   . Diabetes mellitus ([place])   . Anxiety state   . Benign essential HTN   . Acute blood loss anemia   . Anemia of chronic disease   . Idiopathic chronic venous hypertension of both lower extremities with inflammation 10/13/2016  . S/P transmetatarsal amputation of foot, left ([place]) 09/21/2016  . Onychomycosis 08/15/2016  . Gangrene of toe of left foot ([place])   . PAD (peripheral artery disease) ([place]) 07/08/2016  . Atherosclerosis of native artery of left lower extremity with gangrene ([place]) 06/23/2016  . GERD (gastroesophageal reflux disease) 10/07/2015  . ARF (acute renal failure) ([place])   . Hypothermia 06/12/2015  . Acute on chronic renal failure ([place]) 06/12/2015  . CHF (congestive heart failure) ([place]) 07/30/2014  . CKD (chronic kidney disease) stage 4, GFR 15-29 ml/min (HCC) 06/27/2014  .  Hypertensive heart disease 05/25/2013  . CKD (chronic kidney disease) stage 3, GFR 30-59 ml/min (HCC) 05/25/2013  . Pulmonary edema 05/23/2013  . Type 2 diabetes mellitus with diabetic nephropathy ([place]) 05/23/2013  . Type 2 diabetes mellitus with hyperosmolar nonketotic hyperglycemia ([place]) 04/13/2013  . Chronic diastolic CHF (congestive heart failure) ([place]) 04/13/2013  . UGI bleed 03/31/2013  . Hypokalemia 08/24/2012  . Type II or unspecified type diabetes mellitus without mention of complication, not stated as uncontrolled 12/27/2007  . HLD (hyperlipidemia) 12/27/2007  . ALLERGIC RHINITIS 12/27/2007  . Asthma 12/27/2007    Past Medical History:  Past Medical History:  Diagnosis Date  . Abdominal bruit   . Anemia   . Anxiety   . Arthritis    Osteoarthritis  . Asthma   . Cervical disc disease    "pinced nerve"  . CHF (congestive heart failure) ([place])   . Complication of anesthesia    " after I got home from my last procedure, I started itching."  . Diabetes mellitus    Type II  . Diverticulitis   . ESRD on peritoneal dialysis [facility])    Hemodialysis - MWF- [place]   . GERD (gastroesophageal reflux disease)    from medications  . GI bleed 03/31/2013  . History of hiatal hernia   . Hyperlipidemia   . Hypertension   . Neuropathy    left leg  . Osteoporosis   . Peripheral vascular disease ([place])   . Pneumonia    "very young"  . Seasonal allergies   .  Shortness of breath dyspnea    WIth exertion  . Sleep apnea    can't afford cpap   Past Surgical History:  Past Surgical History:  Procedure Laterality Date  . A/V SHUNTOGRAM N/A 09/22/2016   Procedure: A/V Shuntogram - left arm;  Surgeon: Serafina Mitchell, MD;  Location: [place] [facility];  Service: Cardiovascular;  Laterality: N/A;  . ABDOMINAL HYSTERECTOMY  1993`  . AMPUTATION Left 09/01/2016   Procedure: LEFT FOOT TRANSMETATARSAL AMPUTATION;  Surgeon: Newt Minion, MD;  Location: [facility];  Service: Orthopedics;  Laterality:  Left;  . AMPUTATION Right 08/11/2017   Procedure: RIGHT GREAT TOE AMPUTATION DIGIT;  Surgeon: Rosetta Posner, MD;  Location: [facility] OR;  Service: Vascular;  Laterality: Right;  . AV FISTULA PLACEMENT Left 04/21/2016   Procedure: INSERTION OF ARTERIOVENOUS (AV) GORE-TEX GRAFT ARM LEFT;  Surgeon: Elam Dutch, MD;  Location: [place];  Service: Vascular;  Laterality: Left;  . [place] TRANSPOSITION Left 07/10/2014   Procedure: BASCILIC VEIN TRANSPOSITION;  Surgeon: Angelia Mould, MD;  Location: [place];  Service: Vascular;  Laterality: Left;  . BASCILIC VEIN TRANSPOSITION Right 11/08/2014   Procedure: FIRST STAGE BASILIC VEIN TRANSPOSITION;  Surgeon: Angelia Mould, MD;  Location: [facility];  Service: Vascular;  Laterality: Right;  . BASCILIC VEIN TRANSPOSITION Right 01/18/2015   Procedure: SECOND STAGE BASILIC VEIN TRANSPOSITION;  Surgeon: Angelia Mould, MD;  Location: [place];  Service: Vascular;  Laterality: Right;  . CRANIOTOMY N/A 08/23/2017   Procedure: CRANIOTOMY HEMATOMA EVACUATION SUBDURAL;  Surgeon: Ashok Pall, MD;  Location: [facility];  Service: Neurosurgery;  Laterality: N/A;  . CRANIOTOMY Left 08/24/2017   Procedure: CRANIOTOMY FOR RECURRENT ACUTE SUBDURAL HEMATOMA;  Surgeon: Ashok Pall, MD;  Location: [facility];  Service: Neurosurgery;  Laterality: Left;  . ESOPHAGOGASTRODUODENOSCOPY N/A 03/31/2013   Procedure: ESOPHAGOGASTRODUODENOSCOPY (EGD);  Surgeon: Gatha Mayer, MD;  Location: [facility] ENDOSCOPY;  Service: Endoscopy;  Laterality: N/A;  . EYE SURGERY     laser surgery  . FEMORAL-POPLITEAL BYPASS GRAFT Left 07/08/2016   Procedure: LEFT  FEMORAL-BELOW KNEE POPLITEAL ARTERY BYPASS GRAFT USING 6MM X 80 CM PROPATEN GORETEX GRAFT WITH RINGS.;  Surgeon: Rosetta Posner, MD;  Location: [facility];  Service: Vascular;  Laterality: Left;  . FEMORAL-POPLITEAL BYPASS GRAFT Right 08/11/2017   Procedure: RIGHT FEMORAL TO BELOW KNEE POPLITEAL ARTERKY  BYPASS GRAFT USING 6MM RINGED PROPATEN GRAFT;   Surgeon: Rosetta Posner, MD;  Location: [place];  Service: Vascular;  Laterality: Right;  . FISTULOGRAM Left 10/29/2014   Procedure: FISTULOGRAM;  Surgeon: Angelia Mould, MD;  Location: [facility] CATH LAB;  Service: Cardiovascular;  Laterality: Left;  . LIGATION OF ARTERIOVENOUS  FISTULA Left 04/21/2016   Procedure: LIGATION OF ARTERIOVENOUS  FISTULA LEFT ARM;  Surgeon: Elam Dutch, MD;  Location: [facility];  Service: Vascular;  Laterality: Left;  . LOWER EXTREMITY ANGIOGRAPHY N/A 04/06/2017   Procedure: Lower Extremity Angiography - Right;  Surgeon: Serafina Mitchell, MD;  Location: [facility];  Service: Cardiovascular;  Laterality: N/A;  . PATCH ANGIOPLASTY Right 01/18/2015   Procedure: BASILIC VEIN PATCH ANGIOPLASTY USING VASCUGUARD PATCH;  Surgeon: Angelia Mould, MD;  Location: [facility];  Service: Vascular;  Laterality: Right;  . PERIPHERAL VASCULAR BALLOON ANGIOPLASTY  09/22/2016   Procedure: Peripheral Vascular Balloon Angioplasty;  Surgeon: Serafina Mitchell, MD;  Location: [facility] [facility];  Service: Cardiovascular;;  Lt. Fistula  . PERIPHERAL VASCULAR CATHETERIZATION N/A 06/23/2016   Procedure:  Abdominal Aortogram w/Lower Extremity;  Surgeon: Serafina Mitchell, MD;  Location: [place] [facility];  Service: Cardiovascular;  Laterality: N/A;  . PERIPHERAL VASCULAR CATHETERIZATION  06/23/2016   Procedure: Peripheral Vascular Intervention;  Surgeon: Serafina Mitchell, MD;  Location: [place] [facility];  Service: Cardiovascular;;  lt common and external illiac artery    Assessment & Plan Clinical Impression:  GISSELL BARRA is a 67 y.o. female with history of ESRD- HD, diastolic congestive heart failure, diabetes mellitus, HTN, PVD s/p 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 History taken from chart review and husband. She was admitted on 08/11/17 with ischemic right foot with gangrenous changes and underwent right femoral to below knee BG and right great toe amputation by Dr. Donnetta Hutching.   Post op WBAT and was admitted to rehab 08/16/2017 for inpatient therapies to consist of PT and OT at least three hours five days a week.    She was medically stable and right great toe amputation site as well as RLE bypass incisions were healing well. Reactive leucocytosis was resolving and ABLA was stable. She was progressing to modified independent level but on 08/23/17 she sustained a fall. CT ordered and reviewed, showing left SDH.  Per report, large left SDH.  She had a decline in MS. She was taken to OR emergently for left craniotomy the same day by Dr. Christella Noa. She developed recurrent acute SDH requiring surgical evacuation on 01/30. hospital course significant for labile BP requiring Cardene drip, hyponatremia as well as VDRF. She tolerated extubation on 01/31 and mentation improving with occasional bouts of confusion. Dr. Donnetta Hutching following for input and right groin wound noted to have fat necrosis which was debrided at bedside today with recommendations for wet to dry dressing changes. Therapy evaluations done and CIR recommended due to generalized weakness, decreased balance as well as issues with delayed processing.   Patient transferred to CIR on 08/30/2017 .    Patient currently requires max with basic self-care skills secondary to muscle weakness, decreased cardiorespiratoy endurance, decreased coordination, delayed processing and decreased standing balance, decreased postural control, hemiplegia and decreased balance strategies.  Prior to hospitalization, patient could complete ADLs with modified independent .  Patient will benefit from skilled intervention to increase independence with basic self-care skills prior to discharge home with care partner.  Anticipate patient will require intermittent supervision and follow up home health.  OT - End of Session Endurance Deficit: Yes Endurance Deficit Description: fatigues quickly OT Assessment Rehab Potential (ACUTE ONLY): Good OT Barriers to  Discharge: Inaccessible home environment OT Barriers to Discharge Comments: pt's bathroom and bedroom on 2nd floor, she can reside on 1st floor but wants to sleep upstairs OT Patient demonstrates impairments in the following area(s): Balance;Endurance;Motor;Pain;Cognition;Sensory OT Basic ADL's Functional Problem(s): Grooming;Bathing;Dressing;Toileting OT Transfers Functional Problem(s): Toilet OT Additional Impairment(s): Fuctional Use of Upper Extremity OT Plan OT Intensity: Minimum of 1-2 x/day, 45 to 90 minutes OT Frequency: 5 out of 7 days OT Duration/Estimated Length of Stay: 18-21 days OT Treatment/Interventions: Balance/vestibular training;Discharge planning;Functional mobility training;DME/adaptive equipment instruction;Patient/family education;Self Care/advanced ADL retraining;Therapeutic Activities;Therapeutic Exercise;UE/LE Strength taining/ROM;Cognitive remediation/compensation;Neuromuscular re-education;Psychosocial support;UE/LE Coordination activities OT Self Feeding Anticipated Outcome(s): mod I OT Basic Self-Care Anticipated Outcome(s): supervision OT Toileting Anticipated Outcome(s): supervision OT Bathroom Transfers Anticipated Outcome(s): supervision to toilet OT Recommendation Patient destination: Home Follow Up Recommendations: Home health OT Equipment Recommended: 3 in 1 bedside comode;To be determined   Skilled Therapeutic Intervention Pt seen for initial evaluation and ADL training. Her spouse was present initially and  he realized he did not have her L shoe which she needs to safely stand/ transfer.  He went home to find it.  Pt was able to share her recent experiences and what she has learned with fluid conversation, but at times her thinking was slower.  She seems to have functional memory and good safety awareness. "I will never stand up by myself again".  Pt reported that she had been bathed earlier so this session focused on dressing, sit to stand partially with  max A of 1 and into almost a full stand with max A of 2.  Pt became tired at the end of the session.  RN noted her blood pressure was high.  Pt resting in her chair to prepare for lunch with quick release belt on.  OT Evaluation Precautions/Restrictions  Precautions Precautions: Fall Required Braces or Orthoses: Other Brace/Splint Other Brace/Splint: Per rehab notes, pt had Darco shoe for Rt LE -pt stated she did not feel safe in Darco Restrictions RLE Weight Bearing: Weight bearing as tolerated    Vital Signs Therapy Vitals Pulse Rate: 75 BP: (!) 197/81(RN notified, working with PT currently) Pain Pain Assessment Pain Assessment: No/denies pain Faces Pain Scale: No hurt Home Living/Prior Functioning Home Living Available Help at Discharge: Family, Available 24 hours/day Type of Home: House Home Access: Stairs to enter Technical brewer of Steps: 2 Entrance Stairs-Rails: Left Home Layout: Multi-level, Bed/bath upstairs Alternate Level Stairs-Number of Steps: 6 and 6 level Alternate Level Stairs-Rails: Left Bathroom Shower/Tub: Tub/shower unit Additional Comments: tub bench,, grabbar in shower, SPC, 2 rollators, RW  Lives With: Spouse, Other (Comment) Prior Function Level of Independence: Requires assistive device for independence, Independent with basic ADLs, Independent with homemaking with ambulation  Able to Take Stairs?: Yes Driving: Yes Vocation: Retired Comments: walked with cane ADL ADL ADL Comments: refer to functional navigator Vision Baseline Vision/History: Wears glasses Wears Glasses: At all times Patient Visual Report: No change from baseline Vision Assessment?: No apparent visual deficits Perception  Perception: Within Functional Limits Praxis Praxis: Intact Cognition Overall Cognitive Status: Impaired/Different from baseline Arousal/Alertness: Awake/alert Orientation Level: Person;Place;Situation Person: Oriented Place: Oriented Situation:  Oriented Year: 2019 Month: February Day of Week: Correct Memory: Appears intact Immediate Memory Recall: Sock;Blue;Bed Memory Recall: Sock;Blue;Bed Memory Recall Sock: Without Cue Memory Recall Blue: Without Cue Memory Recall Bed: Without Cue Comments: delayed processing, but it is not interfering with therapy activities Sensation Sensation Light Touch: Impaired Detail Light Touch Impaired Details: Impaired LLE;Impaired LUE;Impaired RUE(neuropathy, intermittent tingling in fingers) Stereognosis: Appears Intact Hot/Cold: Appears Intact Proprioception: Appears Intact Additional Comments: L foot neuropathy Coordination Gross Motor Movements are Fluid and Coordinated: No Fine Motor Movements are Fluid and Coordinated: No Coordination and Movement Description: on RUE coordination is slow and delayed with slight rigidity when reaching arm out Finger Nose Finger Test: delayed - pt had great difficulty completing one finger to nose repetition Motor  Motor Motor: Hemiplegia Motor - Skilled Clinical Observations: generalized motor weakness Mobility  Bed Mobility Bed Mobility: Supine to Sit Supine to Sit: 3: Mod assist;HOB elevated;With rails Supine to Sit Details: Verbal cues for technique;Verbal cues for safe use of DME/AE;Verbal cues for precautions/safety;Verbal cues for sequencing Supine to Sit Details (indicate cue type and reason): assist for R leg off bed and to lift trunk with pt pushing up on rail Transfers Sit to Stand: 2: Max assist Sit to Stand Details: Verbal cues for technique;Verbal cues for precautions/safety;Manual facilitation for placement;Verbal cues for safe use of DME/AE Sit to  Stand Details (indicate cue type and reason): increased time, assist to lift hips into standing and cues for hand placement  Trunk/Postural Assessment  Cervical Assessment Cervical Assessment: Within Functional Limits Thoracic Assessment Thoracic Assessment: Within Functional  Limits Lumbar Assessment Lumbar Assessment: Within Functional Limits Postural Control Postural Control: Deficits on evaluation(delayed postural reactions )  Balance Balance Balance Assessed: Yes Static Sitting Balance Static Sitting - Level of Assistance: 5: Stand by assistance Static Sitting - Comment/# of Minutes: UE support on EOB x 4 minutes Static Standing Balance Static Standing - Level of Assistance: 3: Mod assist Static Standing - Comment/# of Minutes: stood for hygiene at sink with mod A x 2 minutes Dynamic Standing Balance Dynamic Standing - Level of Assistance: 2: Max assist Extremity/Trunk Assessment RUE Assessment RUE Assessment: Exceptions to WFL(limited shoulder and finger extension due to OA, overall strength 3+/5) LUE Assessment LUE Assessment: Exceptions to WFL(limited shoulder AROM due to OA, overall strength 4-/5)   See Function Navigator for Current Functional Status.   Refer to Care Plan for Long Term Goals  Recommendations for other services: None    Discharge Criteria: Patient will be discharged from OT if patient refuses treatment 3 consecutive times without medical reason, if treatment goals not met, if there is a change in medical status, if patient makes no progress towards goals or if patient is discharged from hospital.  The above assessment, treatment plan, treatment alternatives and goals were discussed and mutually agreed upon: by patient  Wilson 08/31/2017, 1:13 PM

## 2017-08-31 NOTE — Discharge Summary (Signed)
Physician Discharge Summary  Patient ID: Destiny Day MRN: [id number] DOB/AGE: [birth year] 67 y.o.  Admit date: 08/23/2017 Discharge date: 08/31/2017  Admission Diagnoses:  SDH  Discharge Diagnoses:  Same Active Problems:   Acute respiratory failure with hypoxia (HCC)   Traumatic subdural hematoma (HCC)   Subdural hematoma (HCC)   Diabetic peripheral neuropathy (HCC)   Chronic obstructive pulmonary disease (HCC)   Seizure prophylaxis   Discharged Condition: Stable  Hospital Course:  Destiny Day is a 67 y.o. female who was inpatient in [place] when she fell 08/23/17 and suffered large acute SDH. Underwent surgery as below. Held in ICU for monitoring. Transferred to floor after several days. Progressed well and stable for discharge back to CIR. At time of discharge, pain was well controlled, ambulating with Pt/OT, tolerating po, voiding normal. =  Treatments: Surgery - CRANIOTOMY FOR RECURRENT ACUTE SUBDURAL HEMATOMA    Discharge Exam: Blood pressure (!) 151/69, pulse 80, temperature 97.8 F (36.6 C), temperature source Oral, resp. rate 16, height 5\' 5"  (1.651 m), weight 69 kg (152 lb 1.9 oz), SpO2 100 %. Awake, alert, oriented Speech fluent, appropriate CN grossly intact 5/5 BUE/BLE Wound c/d/i  Disposition: 70-Another Health Care Institution Not Defined  Discharge Instructions    Call MD for:  difficulty breathing, headache or visual disturbances   Complete by:  As directed    Call MD for:  persistant dizziness or light-headedness   Complete by:  As directed    Call MD for:  redness, tenderness, or signs of infection (pain, swelling, redness, odor or green/yellow discharge around incision site)   Complete by:  As directed    Call MD for:  severe uncontrolled pain   Complete by:  As directed    Call MD for:  temperature >100.4   Complete by:  As directed    Diet general   Complete by:  As directed    Driving Restrictions   Complete by:  As directed    Do  not drive until given clearance.   Increase activity slowly   Complete by:  As directed    Lifting restrictions   Complete by:  As directed    Do not lift anything >10lbs. Avoid bending and twisting in awkward positions. Avoid bending at the back.   May shower / Bathe   Complete by:  As directed    In 24 hours. Okay to wash wound with warm soapy water. Avoid scrubbing the wound. Pat dry.   Remove dressing in 24 hours   Complete by:  As directed      Allergies as of 08/30/2017      Reactions   Lisinopril Cough   Prednisone Other (See Comments)   Excessive fluid buildup   Velphoro [sucroferric Oxyhydroxide] Nausea And Vomiting      Medication List    TAKE these medications   acetaminophen 500 MG tablet Commonly known as:  TYLENOL Take 500 mg by mouth every 6 (six) hours as needed.   albuterol 108 (90 Base) MCG/ACT inhaler Commonly known as:  PROVENTIL HFA;VENTOLIN HFA Inhale 1-2 puffs into the lungs every 6 (six) hours as needed for wheezing or shortness of breath.   amLODipine 10 MG tablet Commonly known as:  NORVASC Take 10 mg by mouth at bedtime.   aspirin 81 MG chewable tablet Chew 81 mg by mouth every evening.   atorvastatin 20 MG tablet Commonly known as:  LIPITOR Take 20 mg by mouth at bedtime.   AURYXIA 1 GM 210  MG(Fe) tablet Generic drug:  ferric citrate Take 420 mg by mouth 3 (three) times daily with meals.   budesonide-formoterol 160-4.5 MCG/ACT inhaler Commonly known as:  SYMBICORT Inhale 1 puff into the lungs daily as needed (for respiratory issues).   calcium acetate 667 MG capsule Commonly known as:  PHOSLO Take 667-1,334 mg by mouth See admin instructions. 1 tablet twice daily with snacks. And 2 tablets 3 times daily with meals   carvedilol 12.5 MG tablet Commonly known as:  COREG Take 12.5 mg by mouth 2 (two) times daily.   cinacalcet 30 MG tablet Commonly known as:  SENSIPAR Take 30 mg by mouth daily with supper.   fluticasone 50 MCG/ACT  nasal spray Commonly known as:  FLONASE Place 1 spray into both nostrils daily as needed for allergies or rhinitis.   gabapentin 100 MG capsule Commonly known as:  NEURONTIN Take 1 capsule (100 mg total) by mouth at bedtime. When necessary for neuropathy pain   HUMALOG 100 UNIT/ML injection Generic drug:  insulin lispro Inject 0-8 Units into the skin 3 (three) times daily before meals. 0-59 = hypoglycemic protocol, 60-149 = 0 units, 150-250 = 5 units, 251-300 = 8 units, >301 notify MD/NP   hydrALAZINE 25 MG tablet Commonly known as:  APRESOLINE Take 1 tablet by mouth See admin instructions. Take 1 tab 3 times a day on dialysis days and 1 tab twice daily on non dialysis days   HYDROcodone-acetaminophen 5-325 MG tablet Commonly known as:  NORCO Take 1 tablet by mouth every 6 (six) hours as needed for moderate pain.   isosorbide dinitrate 20 MG tablet Commonly known as:  ISORDIL Take 1 tablet by mouth See admin instructions. Take 1 tablet daily on dialysis days and 1 tab twice daily on non dialysis days   LEVEMIR FLEXTOUCH 100 UNIT/ML Pen Generic drug:  Insulin Detemir Inject 10 Units into the skin at bedtime.   loratadine 10 MG tablet Commonly known as:  CLARITIN Take 10 mg by mouth daily as needed for allergies.   multivitamin Tabs tablet Take 1 tablet by mouth daily.   omeprazole 20 MG capsule Commonly known as:  PRILOSEC Take 20 mg by mouth daily.   VELPHORO 500 MG chewable tablet Generic drug:  sucroferric oxyhydroxide Chew 500-1,000 mg by mouth See admin instructions. Take 2 tablets (1000 mg) by mouth with meals and 1 tablet (500 mg) with snacks        Signed: Mahalie Kanner J 08/31/2017, 12:18 PM

## 2017-09-01 ENCOUNTER — Inpatient Hospital Stay (HOSPITAL_COMMUNITY): Payer: Medicare Other | Admitting: Occupational Therapy

## 2017-09-01 ENCOUNTER — Inpatient Hospital Stay (HOSPITAL_COMMUNITY): Payer: Medicare Other | Admitting: Physical Therapy

## 2017-09-01 ENCOUNTER — Inpatient Hospital Stay (HOSPITAL_COMMUNITY): Payer: Medicare Other

## 2017-09-01 ENCOUNTER — Inpatient Hospital Stay (HOSPITAL_COMMUNITY): Payer: Medicare Other | Admitting: Speech Pathology

## 2017-09-01 DIAGNOSIS — R531 Weakness: Secondary | ICD-10-CM

## 2017-09-01 LAB — RENAL FUNCTION PANEL
Albumin: 2.3 g/dL — ABNORMAL LOW (ref 3.5–5.0)
Anion gap: 16 — ABNORMAL HIGH (ref 5–15)
BUN: 37 mg/dL — ABNORMAL HIGH (ref 6–20)
CO2: 23 mmol/L (ref 22–32)
Calcium: 8.9 mg/dL (ref 8.9–10.3)
Chloride: 93 mmol/L — ABNORMAL LOW (ref 101–111)
Creatinine, Ser: 4.86 mg/dL — ABNORMAL HIGH (ref 0.44–1.00)
GFR calc Af Amer: 10 mL/min — ABNORMAL LOW (ref >60)
GFR calc non Af Amer: 8 mL/min — ABNORMAL LOW (ref >60)
Glucose, Bld: 193 mg/dL — ABNORMAL HIGH (ref 65–99)
Phosphorus: 3.5 mg/dL (ref 2.5–4.6)
Potassium: 3.8 mmol/L (ref 3.5–5.1)
Sodium: 132 mmol/L — ABNORMAL LOW (ref 135–145)

## 2017-09-01 LAB — CBC
HCT: 30.6 % — ABNORMAL LOW (ref 36.0–46.0)
Hemoglobin: 9.5 g/dL — ABNORMAL LOW (ref 12.0–15.0)
MCH: 31.1 pg (ref 26.0–34.0)
MCHC: 31 g/dL (ref 30.0–36.0)
MCV: 100.3 fL — ABNORMAL HIGH (ref 78.0–100.0)
Platelets: 395 10*3/uL (ref 150–400)
RBC: 3.05 MIL/uL — ABNORMAL LOW (ref 3.87–5.11)
RDW: 17.1 % — ABNORMAL HIGH (ref 11.5–15.5)
WBC: 11.3 10*3/uL — ABNORMAL HIGH (ref 4.0–10.5)

## 2017-09-01 LAB — GLUCOSE, CAPILLARY
Glucose-Capillary: 136 mg/dL — ABNORMAL HIGH (ref 65–99)
Glucose-Capillary: 220 mg/dL — ABNORMAL HIGH (ref 65–99)
Glucose-Capillary: 165 mg/dL — ABNORMAL HIGH (ref 65–99)

## 2017-09-01 MED ORDER — HYDROCODONE-ACETAMINOPHEN 5-325 MG PO TABS
ORAL_TABLET | ORAL | Status: AC
  Administered 2017-09-01: 1 via ORAL
  Filled 2017-09-01: qty 1

## 2017-09-01 MED ORDER — COLLAGENASE 250 UNIT/GM EX OINT
TOPICAL_OINTMENT | Freq: Every day | CUTANEOUS | Status: DC
  Administered 2017-09-02 – 2017-09-08 (×6): via TOPICAL
  Filled 2017-09-01: qty 30

## 2017-09-01 NOTE — Progress Notes (Signed)
Patient ID: Destiny Day, female   DOB: June 24, 1951, 67 y.o.   MRN: [id number] Films reviewed today. Improving since last CT. No explanation for right side weakness given improvement on scan, and less shift.

## 2017-09-01 NOTE — Progress Notes (Signed)
Physical Therapy Note  Patient Details  Name: Destiny Day MRN: [id number] Date of Birth: [birth year] Today's Date: 09/01/2017    Time: 1030-1124 54 minutes  1:1 Pt c/o headache, RN present and meds given. Pt reports fatigue but is willing to try "a little bit" of therapy.  Pt donned ted hose with total A, wearing both shoes (Darco shoe d/c'd by physician).  Shoes donned with total A.  stand pivot transfer blocked practice x 4 with mod/max A to stand, mod A to pivot and sit with RW.  Pt with difficulty coming to full upright standing.  nustep 2 x 4 minutes level 3 with pt listening to music of her choice (the temptations).  pt reports fatigue during activity but is energized by the music.  Pt requires max A to transfer after nustep due to fatigue. Pt transfers back to bed with mod/max A with RW.  Mod A for sit to supine.  Pt requires increased time with all mobility due to delayed processing.  Pt left in bed with needs at hand, alarm set.   [name] 09/01/2017, 11:31 AM

## 2017-09-01 NOTE — Progress Notes (Signed)
[place] PHYSICAL MEDICINE & REHABILITATION     PROGRESS NOTE  Subjective/Complaints:  Pt seen lying in bed this AM.  She slept well overnight.  She had a good first day of therapies yesterday.    ROS: Denies CP, SOB, N/V/D.  Objective: Vital Signs: Blood pressure 115/78, pulse (!) 52, temperature 98.9 F (37.2 C), temperature source Oral, resp. rate 16, weight 68.1 kg (150 lb 2.1 oz), SpO2 99 %. No results found. Recent Labs    08/30/17 1206  WBC 15.3*  HGB 10.3*  HCT 32.4*  PLT 428*   Recent Labs    08/30/17 1206  NA 137  K 3.8  CL 96*  GLUCOSE 84  BUN 5*  CREATININE 1.78*  CALCIUM 8.5*   CBG (last 3)  Recent Labs    08/31/17 1700 08/31/17 2105 09/01/17 0639  GLUCAP 161* 130* 136*    Wt Readings from Last 3 Encounters:  09/01/17 68.1 kg (150 lb 2.1 oz)  08/30/17 69 kg (152 lb 1.9 oz)  08/23/17 69.2 kg (152 lb 8.9 oz)    Physical Exam:  BP 115/78 (BP Location: Left Leg)   Pulse (!) 52   Temp 98.9 F (37.2 C) (Oral)   Resp 16   Wt 68.1 kg (150 lb 2.1 oz)   SpO2 99%   BMI 24.98 kg/m  Constitutional: She appears well-developed and well-nourished. No distress.  HENT: Staples c/d/i Eyes: EOM are normal. No discharge.  Cardiovascular: RRR. No JVD. Respiratory: Effort normal and breath sounds normal.  GI: Bowel sounds are normal. She exhibits no distension.  Musculoskeletal:  Right great toe amputation  Left transmetatarsal   Neurological: She is alert.  Speech clear.  Able to follow simple motor commands without difficulty. A&O3.  Motor: RUE: 4-/5 proximal to distal RLE: HF 2+/5, K 3-/5, ADF/PF 4/5 LUE/LLE: 4+/5 proximal to distal  Skin: Skin is warm and dry. She is not diaphoretic.  Right great toe amputation site dusky  Left transmetatarsal site well healed.  Right groin with dressing c/d/i Psychiatric: Normal mood and behavior.   Assessment/Plan: 1. 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 Physiatrist is providing close team supervision and 24 hour management of active medical problems listed below. Physiatrist and rehab team continue to assess barriers to discharge/monitor patient progress toward functional and medical goals.  Function:  Bathing Bathing position   Position: Wheelchair/chair at sink  Bathing parts   Body parts bathed by helper: Right arm, Left arm, Chest, Abdomen, Front perineal area, Buttocks, Right upper leg, Left upper leg, Right lower leg, Left lower leg, Back(per pt report)  Bathing assist        Upper Body Dressing/Undressing Upper body dressing   What is the patient wearing?: Pull over shirt/dress     Pull over shirt/dress - Perfomed by patient: Put head through opening, Thread/unthread left sleeve Pull over shirt/dress - Perfomed by helper: Thread/unthread right sleeve, Pull shirt over trunk        Upper body assist        Lower Body Dressing/Undressing Lower body dressing   What is the patient wearing?: Underwear, Non-skid slipper socks Underwear - Performed by patient: Thread/unthread left underwear leg Underwear - Performed by helper: Thread/unthread right underwear leg, Pull underwear up/down       Non-skid slipper socks- Performed by helper: Don/doff right sock, Don/doff left sock  Lower body assist        Toileting Toileting   Toileting steps completed by patient: (P) Adjust clothing prior to toileting, Performs perineal hygiene, Adjust clothing after toileting      Toileting assist     Transfers Chair/bed transfer   Chair/bed transfer method: Stand pivot Chair/bed transfer assist level: Moderate assist (Pt 50 - 74%/lift or lower) Chair/bed transfer assistive device: Medical sales representative Ambulation activity did not occur: Safety/medical concerns         Wheelchair   Type: Manual Max wheelchair  distance: 50 Assist Level: Touching or steadying assistance (Pt > 75%)  Cognition Comprehension Comprehension assist level: Understands basic 75 - 89% of the time/ requires cueing 10 - 24% of the time  Expression Expression assist level: Expresses basic needs/ideas: With extra time/assistive device  Social Interaction Social Interaction assist level: Interacts appropriately 90% of the time - Needs monitoring or encouragement for participation or interaction.  Problem Solving Problem solving assist level: Solves basic 75 - 89% of the time/requires cueing 10 - 24% of the time  Memory Memory assist level: Recognizes or recalls 90% of the time/requires cueing < 10% of the time    Medical Problem List and Plan: 1.  Generalized weakness, decreased balance as well as issues with delayed processing secondary to TBI with left SDH with evacuation on 1/30.   Cont CIR   Due to increased right sided weakness and recent history, will order repeat CT.  Discussed with PA.   2.  DVT Prophylaxis/Anticoagulation: Mechanical: Sequential compression devices, below knee Bilateral lower extremities 3. Pain Management: Limit use of hydrocodone as confusion reported.  4. Mood: LCSW to follow for evaluation and support.  5. Neuropsych: This patient is not capable of making decisions on her own behalf. 6. Skin/Wound Care: Monitor wound for healing.  7. Fluids/Electrolytes/Nutrition: Monitor I/O. Renal diet with 1200 FR.  8. ESRD: HD MWF at end of the day to help with tolerance of therapy 9. PAD/Right femoral to popliteal bypass: Wet to dry dressing to right groin wound bid.  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   10 T2DM: Monitor BS ac/hs. Continue to use SSI for elevated BS.   Levemir on hold currently due to poor po intake.    Added nephro with meals.    Relatively controlled on 2/6 11. HTN: Monitor BP bid   Continue hydralazine and catapres   Relatively controlled on 2/6 12. Neuropathy RLE: On  gabapentin.  13. Acute on chronic anemia: On Nulecit IV with HD for iron loading and aranesp weekly.    Hb 10.3 on 2/4   Cont to monitor   Labs with HD 14. COPD: Encourage IS with flutter valve. Continue Dulera.  15. Seizure prophylaxis: On keppra tid. 16. Leukocytosis   WBCx 15.3 on 2/4   Cont to monitor  LOS (Days) 2 A FACE TO FACE EVALUATION WAS PERFORMED  Destiny Day 09/01/2017 10:41 AM

## 2017-09-01 NOTE — Progress Notes (Signed)
Occupational Therapy Session Note  Patient Details  Name: Destiny Day MRN: [id number] Date of Birth: Jul 30, 1950  Today's Date: 09/01/2017 OT Individual Time: 1941-7408 OT Individual Time Calculation (min): 74 min    Short Term Goals: Week 1:  OT Short Term Goal 1 (Week 1): Pt will be able to sit to stand with min A of 1 person from w/c for LB self care. OT Short Term Goal 2 (Week 1): Pt will be able to don pants over feet with touching A. OT Short Term Goal 3 (Week 1): Pt will be able to transfer to [facility] or toilet with min A of 1. OT Short Term Goal 4 (Week 1): Pt will actively use her R hand with grooming activities with touching A.  Skilled Therapeutic Interventions/Progress Updates:    Upon entering the room, pt supine in bed with 4/10 c/o headache this session. Pt required increased time for initiation and sequence of supine >sit with max A for B LE's and trunk to EOB. Pt seated on EOB for 3 minutes while therapist provided total A to don L shoe and R darco shoe. Pt standing from bed level with mod lifting assistance from elevated surface. Pt taking small shuffling steps to pivot to the L for wheelchair with overall mod A. Pt seated in wheelchair for breakfast with assistance to open containers and cut food after pt initially attempting but unable to do without assist. Pt required increased time with cues to swallow as she was holding food in R cheek. Pt required encouragement and min cues for initiation to eat meal. Pt utilized R UE to feed self for 50 % with use of hands for muffin. Pt utilized utensil in L hand only for other foods. Pt remained seated in wheelchair with quick release belt donned and all needs within reach.  Therapy Documentation Precautions:  Precautions Precautions: Fall Required Braces or Orthoses: Other Brace/Splint Other Brace/Splint: Per rehab notes, pt had Darco shoe for Rt LE -pt stated she did not feel safe in Darco Restrictions Weight Bearing  Restrictions: (P) Yes RLE Weight Bearing: Weight bearing as tolerated Vital Signs: Therapy Vitals Temp: 98.9 F (37.2 C) Temp Source: Oral Pulse Rate: (!) 52 Resp: 16 BP: 115/78 Patient Position (if appropriate): Lying Oxygen Therapy SpO2: 99 % O2 Device: Not Delivered Pain:   ADL: ADL ADL Comments: refer to functional navigator  See Function Navigator for Current Functional Status.   Therapy/Group: Individual Therapy  Gypsy Decant 09/01/2017, 8:44 AM

## 2017-09-01 NOTE — Plan of Care (Signed)
  Consults [facility] BRAIN INJURY PATIENT EDUCATION Description Description: See Patient Education module for eduction specifics 09/01/2017 9784 - Progressing by Dietrich Pates, RN   RH SKIN INTEGRITY RH STG SKIN FREE OF INFECTION/BREAKDOWN Description Patient and family will be able to verbalize how to keep skin free of breakdown prior to discharge  09/01/2017 1432 - Progressing by Dietrich Pates, RN   RH SAFETY RH STG ADHERE TO SAFETY PRECAUTIONS W/ASSISTANCE/DEVICE Description STG Adhere to Safety Precautions With Min, Assistance/Device.  09/01/2017 1432 - Progressing by Dietrich Pates, RN

## 2017-09-01 NOTE — Progress Notes (Signed)
Patient ID: Destiny Day, female   DOB: Sep 14, 1950, 67 y.o.   MRN: [id number] Sitting up in a wheelchair in rehab.  Her husband is present.  She is having some clumsiness in her right hand.  Right groin looks good with superficial fat necrosis.  Discussed with rehab provider.  Okay to switch to daily Santyl debridement of the subcutaneous fat.  Her right great toe amputation is completely healed and sutures were removed earlier this week.  Okay to walk with normal shoe rather than Darco shoe.  Following from the side lines.

## 2017-09-01 NOTE — Progress Notes (Signed)
Patient reported to have some increase in RUE weakness as well as apraxia. Alert and oriented--following basic commands without difficulty. Will check CT head for follow up. Left message at Dr. Lacy Duverney office regarding changes and to follow up on CT head.

## 2017-09-01 NOTE — Progress Notes (Signed)
[facility] Progress Note  Subjective: no c/o today  Vitals:   08/31/17 1610 08/31/17 2046 08/31/17 2100 09/01/17 0544  BP: (!) 120/94 (!) 159/73 (!) 113/50 115/78  Pulse: 85 77 (!) 59 (!) 52  Resp: 16 17  16   Temp: (!) 97.5 F (36.4 C)   98.9 F (37.2 C)  TempSrc: Oral   Oral  SpO2: 100%  99% 99%  Weight:    68.1 kg (150 lb 2.1 oz)    Inpatient medications: . cloNIDine  0.2 mg Oral BID  . collagenase   Topical Daily  . [START ON 09/02/2017] darbepoetin (ARANESP) injection - DIALYSIS  150 mcg Intravenous Q Thu-HD  . feeding supplement (PRO-STAT SUGAR FREE 64)  30 mL Oral BID  . ferric citrate  420 mg Oral TID WC  . gabapentin  100 mg Oral QHS  . hydrALAZINE  25 mg Oral 2 times per day on Sun Tue Thu Sat  . hydrocerin   Topical BID  . mouth rinse  15 mL Mouth Rinse BID  . mometasone-formoterol  2 puff Inhalation BID  . pantoprazole (PROTONIX) IV  40 mg Intravenous Daily  . polyethylene glycol  17 g Oral Daily   . ferric gluconate (FERRLECIT/NULECIT) IV     acetaminophen, albuterol, aluminum hydroxide, bisacodyl, cloNIDine, diphenhydrAMINE, fluticasone, guaiFENesin-dextromethorphan, HYDROcodone-acetaminophen, naLOXone (NARCAN)  injection, polyethylene glycol, prochlorperazine **OR** prochlorperazine **OR** prochlorperazine, simethicone, sodium phosphate, traZODone  Exam: Alert, head wound clearn and dry,  alert, nad No jvd Chest ctab RRR Abd soft ntnd R grt toe amp R groin wound some drainage noted LUA AVG+bruit  Dialysis: MWF [facility] 4h  66.5kg  3K/2.25 bath  LUA AVG  P4  Hep 2500+ 1000 mid (holding after SDH)  Venofer 50mg  IV qwk - last 1/11 Mircera 71mcg IV q2wks - last 1/09       Impression: 1  PAD sp R fem-pop 2  SDH sp craniotomy 3  ESRD HD mwf 4  Anemia ckd, darbe 150 here, fe load 5  HTN stable 6  DM bs's good, minimal use of SSI 7  CKD MBD, resumed velphoro   Plan - HD today   Kelly Splinter MD [facility] pager  [phone]   09/01/2017, 11:59 AM   Recent Labs  Lab 08/28/17 0413 08/29/17 0326 08/30/17 1206  NA 136 136 137  K 3.9 3.5 3.8  CL 97* 96* 96*  CO2 22 26 25   GLUCOSE 91 95 84  BUN 38* 17 5*  CREATININE 5.29* 3.22* 1.78*  CALCIUM 8.6* 8.5* 8.5*  PHOS 5.1* 4.1 2.2*   Recent Labs  Lab 08/28/17 0413 08/29/17 0326 08/30/17 1206  ALBUMIN 2.1* 2.1* 2.4*   Recent Labs  Lab 08/26/17 0404 08/28/17 0414 08/30/17 1206  WBC 24.8* 13.3* 15.3*  HGB 8.6* 8.9* 10.3*  HCT 26.4* 28.1* 32.4*  MCV 95.7 98.9 100.6*  PLT 497* 471* 428*   Iron/TIBC/Ferritin/ %Sat    Component Value Date/Time   IRON 31 08/28/2017 0414   TIBC 140 (L) 08/28/2017 0414   FERRITIN 1,854 (H) 08/28/2017 0414   IRONPCTSAT 22 08/28/2017 0414

## 2017-09-01 NOTE — Progress Notes (Signed)
Speech Language Pathology Daily Session Note  Patient Details  Name: Destiny Day MRN: [id number] Date of Birth: 1950/10/05  Today's Date: 09/01/2017 SLP Individual Time: 0930-1000 SLP Individual Time Calculation (min): 30 min  Short Term Goals: Week 1: SLP Short Term Goal 1 (Week 1): Pt will tolerate dys 3 textured trials with no overt s/s aspiration and efficent mastication/oral clearence over 3 observed sesions to demonstrate readiness for diet advancement. SLP Short Term Goal 2 (Week 1): Pt will demonstrate sustained attention to functional task for 10 minutes with Min A verbal cues for redirection. SLP Short Term Goal 3 (Week 1): Pt will identify 2 phsyical and 2 cognitive impairments given Min A verbal and question cues.  SLP Short Term Goal 4 (Week 1): Pt will demonstrate functional problem solving for basic, famlar (mildly complex) tasks with Mod A verbal cues.  SLP Short Term Goal 5 (Week 1): Pt will response verbal and functional to presented tasks with Min A repetition of instruction cues SLP Short Term Goal 6 (Week 1): Pt will recall novel and functional information with Mod A verbal assist for use of external aids.   Skilled Therapeutic Interventions:  Pt was seen for skilled ST targeting goals for cognition and dysphagia.  Pt had not eaten much breakfast upon therapist's arrival due to not liking what was brought from the kitchen.  She was agreeable to eating yogurt and drinking a cup of apple juice.  She consumed snack with mod I use of swallowing precautions and no overt s/s of aspiration.  Pt needed mod question cues to identify current cognitive changes s/p hemorrhage and even appeared to have decreased recall of surgery.  She became tearful when made aware that she had staples in her head from surgery despite having been seated in front of mirror washing her hands at the beginning of today's therapy session.  Pt was returned to room and left in chair with husband at bedside.   Continue per current plan of care.    Function:  Eating Eating   Modified Consistency Diet: Yes Eating Assist Level: Set up assist for;Supervision or verbal cues   Eating Set Up Assist For: Opening containers       Cognition Comprehension Comprehension assist level: Understands basic 90% of the time/cues < 10% of the time  Expression   Expression assist level: Expresses basic needs/ideas: With extra time/assistive device  Social Interaction Social Interaction assist level: Interacts appropriately 90% of the time - Needs monitoring or encouragement for participation or interaction.  Problem Solving Problem solving assist level: Solves basic 75 - 89% of the time/requires cueing 10 - 24% of the time  Memory Memory assist level: Recognizes or recalls 50 - 74% of the time/requires cueing 25 - 49% of the time    Pain Pain Assessment Pain Assessment: No/denies pain   Therapy/Group: Individual Therapy  Yina Riviere, Selinda Orion 09/01/2017, 12:56 PM

## 2017-09-02 ENCOUNTER — Inpatient Hospital Stay (HOSPITAL_COMMUNITY): Payer: Medicare Other | Admitting: Speech Pathology

## 2017-09-02 ENCOUNTER — Inpatient Hospital Stay (HOSPITAL_COMMUNITY): Payer: Medicare Other | Admitting: Physical Therapy

## 2017-09-02 ENCOUNTER — Inpatient Hospital Stay (HOSPITAL_COMMUNITY): Payer: Medicare Other | Admitting: Occupational Therapy

## 2017-09-02 DIAGNOSIS — K921 Melena: Secondary | ICD-10-CM | POA: Insufficient documentation

## 2017-09-02 LAB — GLUCOSE, CAPILLARY
Glucose-Capillary: 122 mg/dL — ABNORMAL HIGH (ref 65–99)
Glucose-Capillary: 186 mg/dL — ABNORMAL HIGH (ref 65–99)
Glucose-Capillary: 222 mg/dL — ABNORMAL HIGH (ref 65–99)
Glucose-Capillary: 202 mg/dL — ABNORMAL HIGH (ref 65–99)

## 2017-09-02 MED ORDER — DARBEPOETIN ALFA 150 MCG/0.3ML IJ SOSY
150.0000 ug | PREFILLED_SYRINGE | INTRAMUSCULAR | Status: DC
Start: 2017-09-03 — End: 2017-09-16
  Administered 2017-09-03 – 2017-09-10 (×2): 150 ug via INTRAVENOUS
  Filled 2017-09-02 (×2): qty 0.3

## 2017-09-02 MED ORDER — PANTOPRAZOLE SODIUM 40 MG PO TBEC
40.0000 mg | DELAYED_RELEASE_TABLET | Freq: Every day | ORAL | Status: DC
  Administered 2017-09-02 – 2017-09-16 (×15): 40 mg via ORAL
  Filled 2017-09-02 (×15): qty 1

## 2017-09-02 NOTE — Progress Notes (Signed)
[place] PHYSICAL MEDICINE & REHABILITATION     PROGRESS NOTE  Subjective/Complaints:  Pt seen lying in bed this AM.  She slept well overnight.  Informed by nursing regarding black stools overnight.   ROS: Denies CP, SOB, N/V/D.  Objective: Vital Signs: Blood pressure (!) 156/103, pulse 95, temperature 100 F (37.8 C), resp. rate 20, weight 66 kg (145 lb 8.1 oz), SpO2 98 %. Ct Head Wo Contrast  Result Date: 09/01/2017 CLINICAL DATA:  Ataxia, suspect stroke. Recent craniotomy 08/24/2017 for subdural hematoma EXAM: CT HEAD WITHOUT CONTRAST TECHNIQUE: Contiguous axial images were obtained from the base of the skull through the vertex without intravenous contrast. COMPARISON:  CT 08/24/2017 FINDINGS: Brain: Left-sided craniotomy for subdural evacuation. Fluid and gas remains in the scalp soft tissues. Left frontal subdural hematoma has improved now measuring 8 mm in thickness. Majority of this retains high density. Bifrontal pneumocephalus has resolved. 2 mm midline shift to the right has improved. No new area of hemorrhage since the prior study Ventricle size normal for age. Mild atrophy. No acute infarct or mass. Vascular: Negative for hyperdense vessel Skull: Recent left-sided craniotomy.  No acute skeletal abnormality. Sinuses/Orbits: Paranasal sinuses clear.  Normal orbit. Other: None IMPRESSION: Improving left-sided subdural hematoma following recent craniotomy. Subdural hemorrhage retains high density and measures 8 mm in thickness. Slight midline shift to the right 2 mm has improved. Pneumocephalus has improved. Negative for acute infarct. Electronically Signed   By: Franchot Gallo M.D.   On: 09/01/2017 12:25   Recent Labs    08/30/17 1206 09/01/17 1400  WBC 15.3* 11.3*  HGB 10.3* 9.5*  HCT 32.4* 30.6*  PLT 428* 395   Recent Labs    08/30/17 1206 09/01/17 1400  NA 137 132*  K 3.8 3.8  CL 96* 93*  GLUCOSE 84 193*  BUN 5* 37*  CREATININE 1.78* 4.86*  CALCIUM 8.5* 8.9   CBG  (last 3)  Recent Labs    09/01/17 1144 09/01/17 2052 09/02/17 0647  GLUCAP 220* 165* 122*    Wt Readings from Last 3 Encounters:  09/01/17 66 kg (145 lb 8.1 oz)  08/30/17 69 kg (152 lb 1.9 oz)  08/23/17 69.2 kg (152 lb 8.9 oz)    Physical Exam:  BP (!) 156/103 (BP Location: Left Leg)   Pulse 95   Temp 100 F (37.8 C)   Resp 20   Wt 66 kg (145 lb 8.1 oz)   SpO2 98%   BMI 24.21 kg/m  Constitutional: She appears well-developed and well-nourished. No distress.  HENT: Staples c/d/i Eyes: EOM are normal. No discharge.  Cardiovascular: RRR. No JVD. Respiratory: Effort normal and breath sounds normal.  GI: Bowel sounds are normal. She exhibits no distension.  Musculoskeletal:  Right great toe amputation  Left transmetatarsal   Neurological: She is alert.  Speech clear.  Able to follow simple motor commands without difficulty. A&O3.  Motor: RUE: 4/5 proximal to distal RLE: 3+-4- HF, KE, ADF/PF LUE/LLE: 4+/5 proximal to distal  Skin: Skin is warm and dry. She is not diaphoretic.  Right great toe amputation site dusky  Left transmetatarsal site well healed.  Right groin with dressing c/d/i Psychiatric: +Disinhibition  Assessment/Plan: 1. 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 Physiatrist is providing close team supervision and 24 hour management of active medical problems listed below. Physiatrist and rehab team continue to assess barriers to discharge/monitor patient  progress toward functional and medical goals.  Function:  Bathing Bathing position   Position: Wheelchair/chair at sink  Bathing parts   Body parts bathed by helper: Right arm, Left arm, Chest, Abdomen, Front perineal area, Buttocks, Right upper leg, Left upper leg, Right lower leg, Left lower leg, Back(per pt report)  Bathing assist        Upper Body  Dressing/Undressing Upper body dressing   What is the patient wearing?: Pull over shirt/dress     Pull over shirt/dress - Perfomed by patient: Put head through opening, Thread/unthread left sleeve Pull over shirt/dress - Perfomed by helper: Thread/unthread right sleeve, Pull shirt over trunk        Upper body assist        Lower Body Dressing/Undressing Lower body dressing   What is the patient wearing?: Underwear, Non-skid slipper socks Underwear - Performed by patient: Thread/unthread left underwear leg Underwear - Performed by helper: Thread/unthread right underwear leg, Pull underwear up/down       Non-skid slipper socks- Performed by helper: Don/doff right sock, Don/doff left sock                  Lower body assist        Toileting Toileting   Toileting steps completed by patient: Adjust clothing prior to toileting, Performs perineal hygiene, Adjust clothing after toileting      Toileting assist     Transfers Chair/bed transfer   Chair/bed transfer method: Stand pivot Chair/bed transfer assist level: Moderate assist (Pt 50 - 74%/lift or lower) Chair/bed transfer assistive device: Medical sales representative Ambulation activity did not occur: Safety/medical concerns         Wheelchair   Type: Manual Max wheelchair distance: 50 Assist Level: Touching or steadying assistance (Pt > 75%)  Cognition Comprehension Comprehension assist level: Understands basic 90% of the time/cues < 10% of the time  Expression Expression assist level: Expresses basic needs/ideas: With extra time/assistive device  Social Interaction Social Interaction assist level: Interacts appropriately 90% of the time - Needs monitoring or encouragement for participation or interaction.  Problem Solving Problem solving assist level: Solves basic 75 - 89% of the time/requires cueing 10 - 24% of the time  Memory Memory assist level: Recognizes or recalls 50 - 74% of the time/requires  cueing 25 - 49% of the time    Medical Problem List and Plan: 1.  Generalized weakness, decreased balance as well as issues with delayed processing secondary to TBI with left SDH with evacuation on 1/30.   Cont CIR   Repeat head CT reviewed, slightly improved.  No changes in management per Neurosurg 2.  DVT Prophylaxis/Anticoagulation: Mechanical: Sequential compression devices, below knee Bilateral lower extremities 3. Pain Management: Limit use of hydrocodone as confusion reported.  4. Mood: LCSW to follow for evaluation and support.  5. Neuropsych: This patient is not capable of making decisions on her own behalf. 6. Skin/Wound Care: Monitor wound for healing.  7. Fluids/Electrolytes/Nutrition: Monitor I/O. Renal diet with 1200 FR.  8. ESRD: HD MWF at end of the day to help with tolerance of therapy 9. PAD/Right femoral to popliteal bypass: Wet to dry dressing to right groin wound bid.  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   10 T2DM: Monitor BS ac/hs. Continue to use SSI for elevated BS.   Levemir on hold currently due to poor po intake.    Added nephro with meals.    Relatively controlled on 2/7 11. HTN:  Monitor BP bid   Continue hydralazine and catapres   ?Trending up, cont to monitor 12. Neuropathy RLE: On gabapentin.  13. Acute on chronic anemia: On Nulecit IV with HD for iron loading and aranesp weekly.    Hb 9.5 on 2/6   Cont to monitor   Labs with HD 14. COPD: Encourage IS with flutter valve. Continue Dulera.  15. Seizure prophylaxis: On keppra tid. 16. Leukocytosis   WBCx 11.3 on 2/6   Cont to monitor  17. Black stools   Hemoccult ordered   Cont IV PPI for now  LOS (Days) 3 A FACE TO FACE EVALUATION WAS PERFORMED  Mikalah Skyles Lorie Phenix 09/02/2017 10:08 AM

## 2017-09-02 NOTE — Progress Notes (Signed)
Speech Language Pathology Daily Session Note  Patient Details  Name: Destiny Day MRN: [id number] Date of Birth: 07/20/1951  Today's Date: 09/02/2017 SLP Individual Time: 0800-0900 SLP Individual Time Calculation (min): 60 min  Short Term Goals: Week 1: SLP Short Term Goal 1 (Week 1): Pt will tolerate dys 3 textured trials with no overt s/s aspiration and efficent mastication/oral clearence over 3 observed sesions to demonstrate readiness for diet advancement. SLP Short Term Goal 2 (Week 1): Pt will demonstrate sustained attention to functional task for 10 minutes with Min A verbal cues for redirection. SLP Short Term Goal 3 (Week 1): Pt will identify 2 phsyical and 2 cognitive impairments given Min A verbal and question cues.  SLP Short Term Goal 4 (Week 1): Pt will demonstrate functional problem solving for basic, famlar (mildly complex) tasks with Mod A verbal cues.  SLP Short Term Goal 5 (Week 1): Pt will response verbal and functional to presented tasks with Min A repetition of instruction cues SLP Short Term Goal 6 (Week 1): Pt will recall novel and functional information with Mod A verbal assist for use of external aids.   Skilled Therapeutic Interventions:  Pt was seen for skilled ST targeting cognitive goals.  Subjectively, pt appeared much brighter and clearer today in comparison to previous therapy session.  Therapist assisted in donning shoes and compression stockings in the interest of time, but pt was able to direct her care appropriately for said tasks with supervision question cues.  Pt needed min-mod assist multimodal cues for initiation and sequencing of coming from supine to sitting at edge of bed due to slowed processing.   RT arrived to administer inhaler during session and reported that she had worked with pt while on the ventilator, at which point pt became tearful stating "I didn't know that I was on the ventilator."  Pt also demonstrated decreased frustration tolerance  and became tearful when transferring from bed to wheelchair.  SLP provided encouragement by giving examples of pt's progress made thus far.  Pt was left in wheelchair at the end of today's therapy session with call bell within reach and quick release belt donned.  Continue per current plan of care.    Function:  Eating Eating                 Cognition Comprehension Comprehension assist level: Understands basic 90% of the time/cues < 10% of the time  Expression   Expression assist level: Expresses basic needs/ideas: With extra time/assistive device  Social Interaction Social Interaction assist level: Interacts appropriately 90% of the time - Needs monitoring or encouragement for participation or interaction.  Problem Solving Problem solving assist level: Solves basic 75 - 89% of the time/requires cueing 10 - 24% of the time  Memory Memory assist level: Recognizes or recalls 50 - 74% of the time/requires cueing 25 - 49% of the time    Pain Pain Assessment Pain Assessment: No/denies pain  Therapy/Group: Individual Therapy  [place] Yearby, Selinda Orion 09/02/2017, 12:38 PM

## 2017-09-02 NOTE — Progress Notes (Signed)
Occupational Therapy Session Note  Patient Details  Name: Destiny Day MRN: [id number] Date of Birth: [birth year]  Today's Date: 09/02/2017 OT Individual Time: 1405-1450 OT Individual Time Calculation (min): 45 min    Short Term Goals: Week 1:  OT Short Term Goal 1 (Week 1): Pt will be able to sit to stand with min A of 1 person from w/c for LB self care. OT Short Term Goal 2 (Week 1): Pt will be able to don pants over feet with touching A. OT Short Term Goal 3 (Week 1): Pt will be able to transfer to [facility] or toilet with min A of 1. OT Short Term Goal 4 (Week 1): Pt will actively use her R hand with grooming activities with touching A.  Skilled Therapeutic Interventions/Progress Updates:    Pt presents supine in bed, agreeable to OT tx session with no c/o pain. Pt with lunch tray still in front of her, agreeable to finishing eating sitting EOB. Pt transfers supine>sitting EOB with ModA. Seated EOB Pt utilizing bil UEs to open containers and move items on table while eating. Pt continuing to demonstrate decreased coordination but is able to complete tasks with overall S and increased time. Pt wishing to bathe this session. Completes stand pivot EOB>w/c using RW and ModA for sit<>stand, MinA to transfer to w/c. Seated in w/c Pt washes UB with S and increased time, assist provided for LB due to time. MaxA for LB dressing (due to time) with ModA for sit<>stand at sink and MinA for static balance; Pt donning overhead shirt with MinA to guide over staples in head. Pt left seated in w/c end of session with QRB donned, needs within reach, spouse present.   Therapy Documentation Precautions:  Precautions Precautions: Fall Required Braces or Orthoses: Other Brace/Splint Other Brace/Splint: Per rehab notes, pt had Darco shoe for Rt LE -pt stated she did not feel safe in Darco Restrictions Weight Bearing Restrictions: Yes RLE Weight Bearing: Weight bearing as tolerated   Pain: Pain  Assessment Pain Assessment: No/denies pain ADL: ADL ADL Comments: refer to functional navigator  See Function Navigator for Current Functional Status.   Therapy/Group: Individual Therapy  Raymondo Band 09/02/2017, 4:15 PM

## 2017-09-02 NOTE — Progress Notes (Addendum)
[facility] Progress Note  Subjective: no c/o today  Vitals:   09/01/17 2042 09/01/17 2111 09/02/17 0815 09/02/17 1027  BP:  (!) 156/103  (!) 109/95  Pulse:  95    Resp:      Temp:  100 F (37.8 C)    TempSrc:      SpO2: 100% 100% 98%   Weight:        Inpatient medications: . cloNIDine  0.2 mg Oral BID  . collagenase   Topical Daily  . darbepoetin (ARANESP) injection - DIALYSIS  150 mcg Intravenous Q Thu-HD  . feeding supplement (PRO-STAT SUGAR FREE 64)  30 mL Oral BID  . ferric citrate  420 mg Oral TID WC  . gabapentin  100 mg Oral QHS  . hydrALAZINE  25 mg Oral 2 times per day on Sun Tue Thu Sat  . hydrocerin   Topical BID  . mouth rinse  15 mL Mouth Rinse BID  . mometasone-formoterol  2 puff Inhalation BID  . pantoprazole  40 mg Oral Daily  . polyethylene glycol  17 g Oral Daily   . ferric gluconate (FERRLECIT/NULECIT) IV 125 mg (09/01/17 1638)   acetaminophen, albuterol, aluminum hydroxide, bisacodyl, cloNIDine, diphenhydrAMINE, fluticasone, guaiFENesin-dextromethorphan, HYDROcodone-acetaminophen, naLOXone (NARCAN)  injection, polyethylene glycol, prochlorperazine **OR** prochlorperazine **OR** prochlorperazine, simethicone, sodium phosphate, traZODone  Exam: Alert, head wound clearn and dry,  alert, nad No jvd Chest ctab RRR Abd soft ntnd R grt toe amp R groin wound some drainage noted LUA AVG+bruit  Dialysis: [facility] 4h  66.5kg  3K/2.25 bath  LUA AVG  P4  Hep 2500+ 1000 mid (holding after SDH)  Venofer 50mg  IV qwk - last 1/11 Mircera 39mcg IV q2wks - last 1/09       Impression: 1  PAD sp R fem-pop 2  SDH sp craniotomy 3  ESRD HD mwf 4  Anemia ckd, darbe 150 q Fri, fe load in prog 125mg  x 3 5  HTN stable 6  DM bs's good, minimal use of SSI 7  CKD MBD, resumed velphoro   Plan - HD Friday, IV Fe, esa   Kelly Splinter MD [facility] pager [phone]   09/02/2017, 12:38 PM   Recent Labs  Lab 08/29/17 0326  08/30/17 1206 09/01/17 1400  NA 136 137 132*  K 3.5 3.8 3.8  CL 96* 96* 93*  CO2 26 25 23   GLUCOSE 95 84 193*  BUN 17 5* 37*  CREATININE 3.22* 1.78* 4.86*  CALCIUM 8.5* 8.5* 8.9  PHOS 4.1 2.2* 3.5   Recent Labs  Lab 08/29/17 0326 08/30/17 1206 09/01/17 1400  ALBUMIN 2.1* 2.4* 2.3*   Recent Labs  Lab 08/28/17 0414 08/30/17 1206 09/01/17 1400  WBC 13.3* 15.3* 11.3*  HGB 8.9* 10.3* 9.5*  HCT 28.1* 32.4* 30.6*  MCV 98.9 100.6* 100.3*  PLT 471* 428* 395   Iron/TIBC/Ferritin/ %Sat    Component Value Date/Time   IRON 31 08/28/2017 0414   TIBC 140 (L) 08/28/2017 0414   FERRITIN 1,854 (H) 08/28/2017 0414   IRONPCTSAT 22 08/28/2017 0414

## 2017-09-02 NOTE — Progress Notes (Signed)
Physical Therapy Note  Patient Details  Name: Destiny Day MRN: [id number] Date of Birth: April 22, 1951 Today's Date: 09/02/2017    Time: [phone] minutes  1:1 No c/o pain.  Pt performs transfers blocked practice sit to stand from mat with mod/max A.  Increased assist needed when fatigued.  Short distance gait with RW for strength and balance training 5 steps fwd/backward x 5 with min A.  Gait training with RW 2 x 10' with min A.  nustep per pt request x 8 minutes level 3.  Pt continues to require increased time with all activities and increased rest breaks due to fatigue but is motivated to improve.   Porschia Willbanks 09/02/2017, 11:56 AM

## 2017-09-02 NOTE — Progress Notes (Deleted)
Social Work Patient ID: Destiny Day, female   DOB: 1951/07/06, 67 y.o.   MRN: [id number]                 [name]  Social Worker  General Practice  Progress Notes  Signed  Date of Service: 08/19/2017 3:26 PM          Signed      Pamala Hurry copied text [place] for details  Social Work  Social Work Assessment and Plan  Patient Details  Name: Destiny Day  MRN: [id number]  Date of Birth: May 23, 1951  Today's Date: 08/19/2017  Problem List:         Patient Active Problem List    Diagnosis Date Noted   . Leukocytosis    . Labile blood pressure    . Generalized anxiety disorder    . Debility 08/16/2017   . Amputated great toe of right foot ([place])    . Amputated toe of left foot ([place])    . Post-operative pain    . ESRD on dialysis ([place])    . Diabetes mellitus ([place])    . Anxiety state    . Benign essential HTN    . Acute blood loss anemia    . Anemia of chronic disease    . Idiopathic chronic venous hypertension of both lower extremities with inflammation 10/13/2016   . S/P transmetatarsal amputation of foot, left ([place]) 09/21/2016   . Onychomycosis 08/15/2016   . Gangrene of toe of left foot ([place])    . PAD (peripheral artery disease) ([place]) 07/08/2016   . Atherosclerosis of native artery of left lower extremity with gangrene ([place]) 06/23/2016   . GERD (gastroesophageal reflux disease) 10/07/2015   . ARF (acute renal failure) ([place])    . Hypothermia 06/12/2015   . Acute on chronic renal failure ([place]) 06/12/2015   . CHF (congestive heart failure) ([place]) 07/30/2014   . CKD (chronic kidney disease) stage 4, GFR 15-29 ml/min (HCC) 06/27/2014   . Hypertensive heart disease 05/25/2013   . CKD (chronic kidney disease) stage 3, GFR 30-59 ml/min (HCC) 05/25/2013   . Pulmonary edema 05/23/2013   . Type 2 diabetes mellitus with diabetic nephropathy ([place]) 05/23/2013   . Type 2 diabetes mellitus with hyperosmolar nonketotic hyperglycemia ([place]) 04/13/2013   . Chronic  diastolic CHF (congestive heart failure) ([place]) 04/13/2013   . UGI bleed 03/31/2013   . Hypokalemia 08/24/2012   . Type II or unspecified type diabetes mellitus without mention of complication, not stated as uncontrolled 12/27/2007   . HLD (hyperlipidemia) 12/27/2007   . ALLERGIC RHINITIS 12/27/2007   . Asthma 12/27/2007    Past Medical History:         Past Medical History:   Diagnosis Date   . Abdominal bruit    . Anemia    . Anxiety    . Arthritis     Osteoarthritis   . Asthma    . Cervical disc disease     "pinced nerve"   . CHF (congestive heart failure) ([place])    . Complication of anesthesia     " after I got home from my last procedure, I started itching."   . Diabetes mellitus     Type II   . Diverticulitis    . ESRD on peritoneal dialysis [facility])     Hemodialysis - MWF- [place]    . GERD (gastroesophageal reflux disease)     from medications   . GI bleed 03/31/2013   .  History of hiatal hernia    . Hyperlipidemia    . Hypertension    . Neuropathy     left leg   . Osteoporosis    . Peripheral vascular disease ([place])    . Pneumonia     "very young"   . Seasonal allergies    . Shortness of breath dyspnea     WIth exertion   . Sleep apnea     can't afford cpap    Past Surgical History:          Past Surgical History:   Procedure Laterality Date   . A/V SHUNTOGRAM N/A 09/22/2016    Procedure: A/V Shuntogram - left arm; Surgeon: Serafina Mitchell, MD; Location: [facility]; Service: Cardiovascular; Laterality: N/A;   . ABDOMINAL HYSTERECTOMY  1993`   . AMPUTATION Left 09/01/2016    Procedure: LEFT FOOT TRANSMETATARSAL AMPUTATION; Surgeon: Newt Minion, MD; Location: [place]; Service: Orthopedics; Laterality: Left;   . AMPUTATION Right 08/11/2017    Procedure: RIGHT GREAT TOE AMPUTATION DIGIT; Surgeon: Rosetta Posner, MD; Location: [name] OR; Service: Vascular; Laterality: Right;   . AV FISTULA PLACEMENT Left 04/21/2016    Procedure: INSERTION OF ARTERIOVENOUS (AV)  GORE-TEX GRAFT ARM LEFT; Surgeon: Elam Dutch, MD; Location: [place]; Service: Vascular; Laterality: Left;   . [place] TRANSPOSITION Left 07/10/2014    Procedure: BASCILIC VEIN TRANSPOSITION; Surgeon: Angelia Mould, MD; Location: [place]; Service: Vascular; Laterality: Left;   . BASCILIC VEIN TRANSPOSITION Right 11/08/2014    Procedure: FIRST STAGE BASILIC VEIN TRANSPOSITION; Surgeon: Angelia Mould, MD; Location: [place]; Service: Vascular; Laterality: Right;   . BASCILIC VEIN TRANSPOSITION Right 01/18/2015    Procedure: SECOND STAGE BASILIC VEIN TRANSPOSITION; Surgeon: Angelia Mould, MD; Location: [facility] OR; Service: Vascular; Laterality: Right;   . ESOPHAGOGASTRODUODENOSCOPY N/A 03/31/2013    Procedure: ESOPHAGOGASTRODUODENOSCOPY (EGD); Surgeon: Gatha Mayer, MD; Location: [facility] ENDOSCOPY; Service: Endoscopy; Laterality: N/A;   . EYE SURGERY      laser surgery   . FEMORAL-POPLITEAL BYPASS GRAFT Left 07/08/2016    Procedure: LEFT FEMORAL-BELOW KNEE POPLITEAL ARTERY BYPASS GRAFT USING 6MM X 80 CM PROPATEN GORETEX GRAFT WITH RINGS.; Surgeon: Rosetta Posner, MD; Location: [place]; Service: Vascular; Laterality: Left;   . FEMORAL-POPLITEAL BYPASS GRAFT Right 08/11/2017    Procedure: RIGHT FEMORAL TO BELOW KNEE POPLITEAL ARTERKY BYPASS GRAFT USING 6MM RINGED PROPATEN GRAFT; Surgeon: Rosetta Posner, MD; Location: [place]; Service: Vascular; Laterality: Right;   . FISTULOGRAM Left 10/29/2014    Procedure: FISTULOGRAM; Surgeon: Angelia Mould, MD; Location: [facility] CATH LAB; Service: Cardiovascular; Laterality: Left;   . LIGATION OF ARTERIOVENOUS FISTULA Left 04/21/2016    Procedure: LIGATION OF ARTERIOVENOUS FISTULA LEFT ARM; Surgeon: Elam Dutch, MD; Location: [place]; Service: Vascular; Laterality: Left;   . LOWER EXTREMITY ANGIOGRAPHY N/A 04/06/2017    Procedure: Lower Extremity Angiography - Right; Surgeon: Serafina Mitchell, MD; Location: [place] [facility] LAB; Service: Cardiovascular;  Laterality: N/A;   . PATCH ANGIOPLASTY Right 01/18/2015    Procedure: BASILIC VEIN PATCH ANGIOPLASTY USING VASCUGUARD PATCH; Surgeon: Angelia Mould, MD; Location: [place]; Service: Vascular; Laterality: Right;   . PERIPHERAL VASCULAR BALLOON ANGIOPLASTY  09/22/2016    Procedure: Peripheral Vascular Balloon Angioplasty; Surgeon: Serafina Mitchell, MD; Location: [place] CV LAB; Service: Cardiovascular;; Lt. Fistula   . PERIPHERAL VASCULAR CATHETERIZATION N/A 06/23/2016    Procedure: Abdominal Aortogram w/Lower Extremity; Surgeon: Serafina Mitchell, MD; Location: [place] CV LAB; Service:  Cardiovascular; Laterality: N/A;   . PERIPHERAL VASCULAR CATHETERIZATION  06/23/2016    Procedure: Peripheral Vascular Intervention; Surgeon: Serafina Mitchell, MD; Location: [place] CV LAB; Service: Cardiovascular;; lt common and external illiac artery    Social History: reports that she quit smoking about 5 years ago. Her smoking use included cigarettes. She has a 14.00 pack-year smoking history. she has never used smokeless tobacco. She reports that she does not drink alcohol or use drugs.  Family / Support Systems  Marital Status: Married  Patient Roles: Spouse, Parent  Spouse/Significant Other: Bhavya Eschete @ (C) [phone]  Children: daughter, Gladstone Lighter (local) @ [phone]; son, Tashonda Pinkus ([place]) @ (C) [phone] and two other sons living out of town  Anticipated Caregiver: spouse  Ability/Limitations of Caregiver: Pt spouse works two p/t jobs  Building control surveyor Availability: Intermittent  Family Dynamics: Patient's spouse at bedside and very supportive and encouraging the patient. Patient reports that her children check on her regularly and are very supportive  Social History  Preferred language: English  Religion: Christian  Cultural Background: NA  Read: Yes  Write: Yes  Employment Status: Disabled  Freight forwarder Issues: None  Guardian/Conservator: None -Per  MD, patient is capable of making decisions on her own behalf  Abuse/Neglect  Abuse/Neglect Assessment Can Be Completed: Yes  Physical Abuse: Denies  Verbal Abuse: Denies  Sexual Abuse: Denies  Exploitation of patient/patient's resources: Denies  Self-Neglect: Denies  Emotional Status  Pt's affect, behavior adn adjustment status: Patient sitting up in wheelchair and does report fatigue from full day of therapies, however, she is able to complete this assessment and interview without any difficulty. She denies any significant emotional distress. Reports she feels motivated for therapies and hopeful she will be able to regain her independence. We will monitor mood and refer for neuropsychology as indicated.  Recent Psychosocial Issues: Ongoing health issues requiring frequent medical visits and past hospitalizations.  Pyschiatric History: None  Substance Abuse History: None  Patient / Family Perceptions, Expectations & Goals  Pt/Family understanding of illness & functional limitations: Patient and spouse with good understanding of surgery performed and of current functional limitations/need for CIR.  Premorbid pt/family roles/activities: Patient was modified independent in the home PTA and has been using [facility] for HD appointments.  Anticipated changes in roles/activities/participation: Minimal change anticipated if patient able to reach modified independent goals.  Pt/family expectations/goals: "I need to be able to safely get out of my house and down to the [facility] bus."  [employer]: Other (Comment)([facility] for HD transport)  Premorbid Home Care/DME Agencies: Other (Comment)([facility])  Transportation available at discharge: Yes  Discharge Planning  Living Arrangements: Spouse/significant other  Support Systems: Spouse/significant other  Type of Residence: Private residence  Insurance Resources: [employer]  Financial Resources: [employer]  Financial Screen Referred: No    Living Expenses: Geophysical data processor Management: Spouse  Does the patient have any problems obtaining your medications?: No  Home Management: Spouse does majority of home management.  Patient/Family Preliminary Plans: Patient to return home with husband who does work 2 part-time jobs and home intermittently during the day  Social Work Anticipated Follow Up Needs: HH/OP  Expected length of stay: 7-10 days  Clinical Impression  Pleasant woman on CIR following RLE bypass and toe amputation. Spouse at bedside and supportive and both hopeful she can reach mod independent goals. Pt's primary goal is to be able to walk out of home and safely to [facility] bus which takes her to HD.  Pt denies any significant emotional distress but will monitor. Will follow for support and d/c planning needs.  Lennart Pall  08/19/2017, 3:27 PM    Electronically signed by Lowella Curb, LCSW at 08/19/2017 3:43 PM        Admission (Discharged) on 08/16/2017   Detailed Report

## 2017-09-02 NOTE — Progress Notes (Addendum)
Have reviewed prior SW assessment with pt and spouse.  No changes needed to information except r/t discharge plans.  Spouse confirms he will be taking FMLA at time of d/c in order to provide pt with 24/7 assistance. Both pt and spouse aware goals being set for min assist overall.  Pt denies any significant emotional distress r/t overall decrease in independence and functioning following fall, SDH and surgery.  Will follow for support and d/c planning needs.      Social Work  Social Work Assessment and Plan  Patient Details  Name: Destiny Day MRN: [id number] Date of Birth: [birth year]  Today's Date: 08/19/2017  Problem List:      Patient Active Problem List   Diagnosis Date Noted  . Leukocytosis   . Labile blood pressure   . Generalized anxiety disorder   . Debility 08/16/2017  . Amputated great toe of right foot ([place])   . Amputated toe of left foot ([place])   . Post-operative pain   . ESRD on dialysis ([place])   . Diabetes mellitus ([place])   . Anxiety state   . Benign essential HTN   . Acute blood loss anemia   . Anemia of chronic disease   . Idiopathic chronic venous hypertension of both lower extremities with inflammation 10/13/2016  . S/P transmetatarsal amputation of foot, left ([place]) 09/21/2016  . Onychomycosis 08/15/2016  . Gangrene of toe of left foot ([place])   . PAD (peripheral artery disease) ([place]) 07/08/2016  . Atherosclerosis of native artery of left lower extremity with gangrene ([place]) 06/23/2016  . GERD (gastroesophageal reflux disease) 10/07/2015  . ARF (acute renal failure) ([place])   . Hypothermia 06/12/2015  . Acute on chronic renal failure ([place]) 06/12/2015  . CHF (congestive heart failure) ([place]) 07/30/2014  . CKD (chronic kidney disease) stage 4, GFR 15-29 ml/min (HCC) 06/27/2014  . Hypertensive heart disease 05/25/2013  . CKD (chronic kidney disease) stage 3, GFR 30-59 ml/min (HCC) 05/25/2013  . Pulmonary edema 05/23/2013  . Type 2 diabetes  mellitus with diabetic nephropathy ([place]) 05/23/2013  . Type 2 diabetes mellitus with hyperosmolar nonketotic hyperglycemia ([place]) 04/13/2013  . Chronic diastolic CHF (congestive heart failure) ([place]) 04/13/2013  . UGI bleed 03/31/2013  . Hypokalemia 08/24/2012  . Type II or unspecified type diabetes mellitus without mention of complication, not stated as uncontrolled 12/27/2007  . HLD (hyperlipidemia) 12/27/2007  . ALLERGIC RHINITIS 12/27/2007  . Asthma 12/27/2007   Past Medical History:      Past Medical History:  Diagnosis Date  . Abdominal bruit   . Anemia   . Anxiety   . Arthritis    Osteoarthritis  . Asthma   . Cervical disc disease    "pinced nerve"  . CHF (congestive heart failure) ([place])   . Complication of anesthesia    " after I got home from my last procedure, I started itching."  . Diabetes mellitus    Type II  . Diverticulitis   . ESRD on peritoneal dialysis [facility])    Hemodialysis - MWF- [place]   . GERD (gastroesophageal reflux disease)    from medications  . GI bleed 03/31/2013  . History of hiatal hernia   . Hyperlipidemia   . Hypertension   . Neuropathy    left leg  . Osteoporosis   . Peripheral vascular disease ([place])   . Pneumonia    "very young"  . Seasonal allergies   . Shortness of breath dyspnea    WIth exertion  . Sleep apnea  can't afford cpap   Past Surgical History:       Past Surgical History:  Procedure Laterality Date  . A/V SHUNTOGRAM N/A 09/22/2016   Procedure: A/V Shuntogram - left arm;  Surgeon: Serafina Mitchell, MD;  Location: [place] [facility];  Service: Cardiovascular;  Laterality: N/A;  . ABDOMINAL HYSTERECTOMY  1993`  . AMPUTATION Left 09/01/2016   Procedure: LEFT FOOT TRANSMETATARSAL AMPUTATION;  Surgeon: Newt Minion, MD;  Location: [place];  Service: Orthopedics;  Laterality: Left;  . AMPUTATION Right 08/11/2017   Procedure: RIGHT GREAT TOE AMPUTATION DIGIT;  Surgeon: Rosetta Posner, MD;   Location: [facility] OR;  Service: Vascular;  Laterality: Right;  . AV FISTULA PLACEMENT Left 04/21/2016   Procedure: INSERTION OF ARTERIOVENOUS (AV) GORE-TEX GRAFT ARM LEFT;  Surgeon: Elam Dutch, MD;  Location: [place];  Service: Vascular;  Laterality: Left;  . [place] TRANSPOSITION Left 07/10/2014   Procedure: BASCILIC VEIN TRANSPOSITION;  Surgeon: Angelia Mould, MD;  Location: [place];  Service: Vascular;  Laterality: Left;  . BASCILIC VEIN TRANSPOSITION Right 11/08/2014   Procedure: FIRST STAGE BASILIC VEIN TRANSPOSITION;  Surgeon: Angelia Mould, MD;  Location: [place];  Service: Vascular;  Laterality: Right;  . BASCILIC VEIN TRANSPOSITION Right 01/18/2015   Procedure: SECOND STAGE BASILIC VEIN TRANSPOSITION;  Surgeon: Angelia Mould, MD;  Location: [facility] OR;  Service: Vascular;  Laterality: Right;  . ESOPHAGOGASTRODUODENOSCOPY N/A 03/31/2013   Procedure: ESOPHAGOGASTRODUODENOSCOPY (EGD);  Surgeon: Gatha Mayer, MD;  Location: [facility] ENDOSCOPY;  Service: Endoscopy;  Laterality: N/A;  . EYE SURGERY     laser surgery  . FEMORAL-POPLITEAL BYPASS GRAFT Left 07/08/2016   Procedure: LEFT  FEMORAL-BELOW KNEE POPLITEAL ARTERY BYPASS GRAFT USING 6MM X 80 CM PROPATEN GORETEX GRAFT WITH RINGS.;  Surgeon: Rosetta Posner, MD;  Location: [place];  Service: Vascular;  Laterality: Left;  . FEMORAL-POPLITEAL BYPASS GRAFT Right 08/11/2017   Procedure: RIGHT FEMORAL TO BELOW KNEE POPLITEAL ARTERKY  BYPASS GRAFT USING 6MM RINGED PROPATEN GRAFT;  Surgeon: Rosetta Posner, MD;  Location: [place];  Service: Vascular;  Laterality: Right;  . FISTULOGRAM Left 10/29/2014   Procedure: FISTULOGRAM;  Surgeon: Angelia Mould, MD;  Location[facility] C[facility] LAB;  Service: Cardiovascular;  Laterality: Left;  . LIGATION OF ARTERIOVENOUS  FISTULA Left 04/21/2016   Procedure: LIGATION OF ARTERIOVENOUS  FISTULA LEFT ARM;  Surgeon: Elam Dutch, MD;  Location: [place];  Service: Vascular;  Laterality: Left;  . LOWER  EXTREMITY ANGIOGRAPHY N/A 04/06/2017   Procedure: Lower Extremity Angiography - Right;  Surgeon: Serafina Mitchell, MD;  Location: [place] [facility];  Service: Cardiovascular;  Laterality: N/A;  . PATCH ANGIOPLASTY Right 01/18/2015   Procedure: BASILIC VEIN PATCH ANGIOPLASTY USING VASCUGUARD PATCH;  Surgeon: Angelia Mould, MD;  Location: [place];  Service: Vascular;  Laterality: Right;  . PERIPHERAL VASCULAR BALLOON ANGIOPLASTY  09/22/2016   Procedure: Peripheral Vascular Balloon Angioplasty;  Surgeon: Serafina Mitchell, MD;  Location: [place] [facility];  Service: Cardiovascular;;  Lt. Fistula  . PERIPHERAL VASCULAR CATHETERIZATION N/A 06/23/2016   Procedure: Abdominal Aortogram w/Lower Extremity;  Surgeon: Serafina Mitchell, MD;  Location: [place] [facility];  Service: Cardiovascular;  Laterality: N/A;  . PERIPHERAL VASCULAR CATHETERIZATION  06/23/2016   Procedure: Peripheral Vascular Intervention;  Surgeon: Serafina Mitchell, MD;  Location: [place] [facility];  Service: Cardiovascular;;  lt common and external illiac artery   Social History:  reports that she quit smoking  about 5 years ago. Her smoking use included cigarettes. She has a 14.00 pack-year smoking history. she has never used smokeless tobacco. She reports that she does not drink alcohol or use drugs.  Family / Support Systems Marital Status: Married Patient Roles: Spouse, Parent Spouse/Significant Other: Cyndra Feinberg @ (C) [phone] Children: daughter, Gladstone Lighter (local) @ [phone];  son, Lawrie Tunks ([place]) @ (C) [phone] and two other sons living out of town Anticipated Caregiver: spouse Ability/Limitations of Caregiver: Pt spouse works two p/t jobs Building control surveyor Availability: Intermittent Family Dynamics: Patient's spouse at bedside and very supportive and encouraging the patient.  Patient reports that her children check on her regularly and are very supportive  Social History Preferred language:  English Religion: Christian Cultural Background: NA Read: Yes Write: Yes Employment Status: Disabled Freight forwarder Issues: None Guardian/Conservator: None -Per MD, patient is capable of making decisions on her own behalf   Abuse/Neglect Abuse/Neglect Assessment Can Be Completed: Yes Physical Abuse: Denies Verbal Abuse: Denies Sexual Abuse: Denies Exploitation of patient/patient's resources: Denies Self-Neglect: Denies  Emotional Status Pt's affect, behavior adn adjustment status: Patient sitting up in wheelchair and does report fatigue from full day of therapies, however, she is able to complete this assessment and interview without any difficulty.  She denies any significant emotional distress.  Reports she feels motivated for therapies and hopeful she will be able to regain her independence.  We will monitor mood and refer for neuropsychology as indicated. Recent Psychosocial Issues: Ongoing health issues requiring frequent medical visits and past hospitalizations. Pyschiatric History: None Substance Abuse History: None  Patient / Family Perceptions, Expectations & Goals Pt/Family understanding of illness & functional limitations: Patient and spouse with good understanding of surgery performed and of current functional limitations/need for CIR. Premorbid pt/family roles/activities: Patient was modified independent in the home PTA and has been using [facility] for HD appointments. Anticipated changes in roles/activities/participation: Spouse will now need to provide 24/7 caregiver support and assist with getting to HD center.  Pt/family expectations/goals: "I just want to get home as soon as I can but I need to be stronger."  [employer]: Other (Comment)([facility] for HD transport) Premorbid Home Care/DME Agencies: Other (Comment)([facility]) Transportation available at discharge: Yes  Discharge Planning Living Arrangements: Spouse/significant  other Support Systems: Spouse/significant other Type of Residence: Private residence Insurance Resources: [employer] Financial Resources: [employer] Financial Screen Referred: No Living Expenses: Higher education careers adviser Management: Spouse Does the patient have any problems obtaining your medications?: No Home Management: Spouse does majority of home management. Patient/Family Preliminary Plans: Patient to return home with husband who is taking FMLA and will provide 24/7 assistance.  Social Work Anticipated Follow Up Needs: HH/OP Expected length of stay: 14  days  Clinical Impression  Pleasant woman returning to CIR following surgery for SDH.  Spouse at bedside and supportive and both hopeful she can reach min assist goals.  Pt's primary goal is to be able to walk out of home and safely to [facility] bus which takes her to HD.  Pt denies any significant emotional distress but will monitor.  Will follow for support and d/c planning needs.    Dorethia Jeanmarie 08/19/2017, 3:27 PM  Updated 09/02/17 @ 1:02 PM

## 2017-09-02 NOTE — Progress Notes (Signed)
Social Work Patient ID: Destiny Day, female   DOB: [birth year], 67 y.o.   MRN: [id number]    Destiny Day  Social Worker  General Practice  Patient Care Conference  Signed  Date of Service:  09/02/2017 12:29 PM          Signed          [] Hide copied text  [] Hover for details   Inpatient RehabilitationTeam Conference and Plan of Care Update Date: 09/01/2017   Time: 3:00 PM      Patient Name: Destiny Day      Medical Record Number: [id number]  Date of Birth: 1950/12/02 Sex: Female         Room/Bed: 4W05C/4W05C-01 Payor Info: Payor: MEDICARE / Plan: MEDICARE PART A AND B / Product Type: *No Product type* /     Admitting Diagnosis: SDH  Admit Date/Time:  08/30/2017  6:06 PM Admission Comments: No comment available    Primary Diagnosis:  <principal problem not specified> Principal Problem: <principal problem not specified>       Patient Active Problem List    Diagnosis Date Noted  . Black stool    . Right sided weakness    . Subdural hemorrhage following injury ([place]) 08/30/2017  . Diabetic peripheral neuropathy ([place])    . Chronic obstructive pulmonary disease ([place])    . Seizure prophylaxis    . Subdural hematoma ([place]) 08/24/2017  . Traumatic subdural hematoma ([place]) 08/23/2017  . Fall    . SDH (subdural hematoma) ([place])    . Acute respiratory failure with hypoxia ([place])    . Diabetes mellitus type 2 in nonobese (HCC)    . Leukocytosis    . Labile blood pressure    . Generalized anxiety disorder    . Debility 08/16/2017  . Amputated great toe of right foot ([place])    . Amputated toe of left foot ([place])    . Post-operative pain    . ESRD on dialysis ([place])    . Diabetes mellitus ([place])    . Anxiety state    . Benign essential HTN    . Acute blood loss anemia    . Anemia of chronic disease    . Idiopathic chronic venous hypertension of both lower extremities with inflammation 10/13/2016  . S/P transmetatarsal amputation of foot, left ([place]) 09/21/2016    . Onychomycosis 08/15/2016  . Gangrene of toe of left foot ([place])    . PAD (peripheral artery disease) ([place]) 07/08/2016  . Atherosclerosis of native artery of left lower extremity with gangrene ([place]) 06/23/2016  . GERD (gastroesophageal reflux disease) 10/07/2015  . ARF (acute renal failure) ([place])    . Hypothermia 06/12/2015  . Acute on chronic renal failure ([place]) 06/12/2015  . CHF (congestive heart failure) ([place]) 07/30/2014  . CKD (chronic kidney disease) stage 4, GFR 15-29 ml/min (HCC) 06/27/2014  . Hypertensive heart disease 05/25/2013  . CKD (chronic kidney disease) stage 3, GFR 30-59 ml/min (HCC) 05/25/2013  . Pulmonary edema 05/23/2013  . Type 2 diabetes mellitus with diabetic nephropathy ([place]) 05/23/2013  . Type 2 diabetes mellitus with hyperosmolar nonketotic hyperglycemia ([place]) 04/13/2013  . Chronic diastolic CHF (congestive heart failure) ([place]) 04/13/2013  . UGI bleed 03/31/2013  . Hypokalemia 08/24/2012  . Type II or unspecified type diabetes mellitus without mention of complication, not stated as uncontrolled 12/27/2007  . HLD (hyperlipidemia) 12/27/2007  . ALLERGIC RHINITIS 12/27/2007  . Asthma 12/27/2007      Expected Discharge Date: Expected Discharge Date:  09/16/17   Team Members Present: Physician leading conference: Dr. 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  Improve mobility, DM/BP, cognition, weakness  See above   Bowel/Bladder     LBM 08/31/2017 Incontinent of bowel and urine ( Oliguria) ESRD -M-W-F  Moderate assist x1,Remain Continent of Bladder/bowel while on Rehab  Assess  and monitor bladder/bowel  QS and PRN,admister medication Laxative with in 2-3 days for bowel , Oliguria-Anguia reported and monitor   Swallow/Nutrition/ Hydration               ADL's     max A overall  Supervision overall  ADL training, RUE NMR, activity and standing tolerance, functional mobility, pt education   Mobility     mod A transfers, short distance gait, min A w/c  min A/supervision  activity tolerance, balance, attention, initiation   Communication               Safety/Cognition/ Behavioral Observations   Alert oriented x3 able to verbalize her needs to staff, coopeative. High Risk fall s.p Cranio seconday Fall, no futher reportd falls  Mod assist, no fall while on RH  High Risk for falls, monior at all times while up and educate patient,family and staff on fall prevention goal daily.    Pain     Intermittent c/o head ache with discomfort to surgical area. Administer  meds. with Tylenol and Norco alternating as needed         Skin     Surgical incision intact and healingv well (site open to air)  Prevent onset infection or surgical complications          Rehab Goals Patient on target to meet rehab goals: Yes *See Care Plan and progress notes for long and short-term goals.      Barriers to Discharge   Current Status/Progress Possible Resolutions Date Resolved   Physician     Medical stability;Hemodialysis;Wound Care     See above  Therapies, optimize BP/DM meds, follow labs, repeat head CT      Nursing                 PT  Inaccessible home environment  two level home              OT Inaccessible home environment  pt's bathroom and bedroom on 2nd floor, she can reside on 1st floor but wants to sleep upstairs             SLP            SW              Discharge Planning/Teaching Needs:  Husband taking FMLA to provide 24/7 assistance for pt at d/c.  Will need teaching with spouse prior to d/c.   Team Discussion:  Returning pt now post-neurosurgery for SDH.   Appears some weaker on right side today - MD to check CT.  Max assist with transfers and only taking a few steps.  Slow to process and ?internally distracted and request cues.  Mod assist with cognition.  Hoping to reach min assist  goals.  Revisions to Treatment Plan:  None    Continued Need for Acute Rehabilitation Level of Care: The patient requires daily medical management by a physician with specialized training in physical medicine and rehabilitation for the following conditions: Daily direction of a multidisciplinary physical rehabilitation program to ensure safe treatment while eliciting the highest outcome that is of practical value to the patient.: Yes Daily medical management of patient stability for increased activity during participation in an intensive rehabilitation regime.: Yes Daily analysis of laboratory values and/or radiology reports with any subsequent need for medication adjustment of medical intervention for : Post surgical problems;Renal problems;Wound care problems;Diabetes problems;Blood pressure problems;Neurological problems   Millianna Szymborski 09/02/2017, 12:30 PM                  Lowella Curb, LCSW  Social Worker  General Practice  Patient Care Conference  Signed  Date of Service:  09/02/2017 12:29 PM          Signed          [] Hide copied text  [] Hover for details   Inpatient RehabilitationTeam Conference and Plan of Care Update Date: 09/01/2017   Time: 3:00 PM      Patient Name: Destiny Day      Medical Record Number: [id number]  Date of Birth: [birth year] Sex: Female         Room/Bed: 4W05C/4W05C-01 Payor Info: Payor: MEDICARE / Plan: MEDICARE PART A AND B / Product Type: *No Product type* /     Admitting Diagnosis: SDH  Admit Date/Time:  08/30/2017  6:06 PM Admission Comments: No comment available    Primary Diagnosis:  <principal problem not specified> Principal Problem: <principal problem not specified>       Patient Active Problem  List    Diagnosis Date Noted  . Black stool    . Right sided weakness    . Subdural hemorrhage following injury ([place]) 08/30/2017  . Diabetic peripheral neuropathy ([place])    . Chronic obstructive pulmonary disease ([place])    . Seizure prophylaxis    . Subdural hematoma ([place]) 08/24/2017  . Traumatic subdural hematoma ([place]) 08/23/2017  . Fall    . SDH (subdural hematoma) ([place])    . Acute respiratory failure with hypoxia ([place])    . Diabetes mellitus type 2 in nonobese (HCC)    . Leukocytosis    . Labile blood pressure    . Generalized anxiety disorder    . Debility 08/16/2017  . Amputated great toe of right foot ([place])    . Amputated toe of left foot ([place])    . Post-operative pain    . ESRD on dialysis ([place])    . Diabetes mellitus ([place])    . Anxiety state    . Benign essential HTN    . Acute blood loss anemia    . Anemia of chronic disease    . Idiopathic chronic venous hypertension of both lower extremities with inflammation 10/13/2016  . S/P transmetatarsal amputation of foot, left ([place]) 09/21/2016  . Onychomycosis 08/15/2016  . Gangrene of toe of left foot ([place])    . PAD (peripheral artery disease) ([place]) 07/08/2016  . Atherosclerosis of native artery of left lower extremity with gangrene ([place]) 06/23/2016  . GERD (gastroesophageal reflux disease) 10/07/2015  . ARF (acute renal failure) ([place])    . Hypothermia 06/12/2015  . Acute on chronic renal failure ([place]) 06/12/2015  . CHF (congestive heart failure) ([place]) 07/30/2014  . CKD (chronic kidney disease) stage  4, GFR 15-29 ml/min (HCC) 06/27/2014  . Hypertensive heart disease 05/25/2013  . CKD (chronic kidney disease) stage 3, GFR 30-59 ml/min (HCC) 05/25/2013  . Pulmonary edema 05/23/2013  . Type 2 diabetes mellitus with diabetic nephropathy ([place]) 05/23/2013  . Type 2 diabetes mellitus with hyperosmolar nonketotic hyperglycemia ([place]) 04/13/2013  . Chronic diastolic CHF (congestive heart failure) ([place]) 04/13/2013  . UGI bleed  03/31/2013  . Hypokalemia 08/24/2012  . Type II or unspecified type diabetes mellitus without mention of complication, not stated as uncontrolled 12/27/2007  . HLD (hyperlipidemia) 12/27/2007  . ALLERGIC RHINITIS 12/27/2007  . Asthma 12/27/2007      Expected Discharge Date: Expected Discharge Date: 09/16/17   Team Members Present: Physician leading conference: Dr. 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  Improve mobility, DM/BP, cognition, weakness  See above   Bowel/Bladder     LBM 08/31/2017 Incontinent of bowel and urine ( Oliguria) ESRD -M-W-F  Moderate assist x1,Remain Continent of Bladder/bowel while on Rehab  Assess and monitor bladder/bowel  QS and PRN,admister medication Laxative with in 2-3 days for bowel , Oliguria-Anguia reported and monitor   Swallow/Nutrition/ Hydration               ADL's     max A overall  Supervision overall  ADL training, RUE NMR, activity and standing tolerance, functional mobility, pt education   Mobility     mod A transfers, short distance gait, min A w/c  min A/supervision  activity tolerance, balance, attention, initiation   Communication               Safety/Cognition/ Behavioral Observations   Alert oriented x3 able to verbalize her needs to staff, coopeative. High Risk fall s.p Cranio seconday Fall, no futher reportd falls  Mod assist, no fall while on RH  High Risk for falls, monior at all times while up and educate patient,family and staff on fall prevention goal daily.    Pain     Intermittent c/o head ache with discomfort to surgical area. Administer   meds. with Tylenol and Norco alternating as needed         Skin     Surgical incision intact and healingv well (site open to air)  Prevent onset infection or surgical complications          Rehab Goals Patient on target to meet rehab goals: Yes *See Care Plan and progress notes for long and short-term goals.      Barriers to Discharge   Current Status/Progress Possible Resolutions Date Resolved   Physician     Medical stability;Hemodialysis;Wound Care     See above  Therapies, optimize BP/DM meds, follow labs, repeat head CT      Nursing                 PT  Inaccessible home environment  two level home              OT Inaccessible home environment  pt's bathroom and bedroom on 2nd floor, she can reside on 1st floor but wants to sleep upstairs  SLP            SW              Discharge Planning/Teaching Needs:  Husband taking FMLA to provide 24/7 assistance for pt at d/c.  Will need teaching with spouse prior to d/c.   Team Discussion:  Returning pt now post-neurosurgery for SDH.  Appears some weaker on right side today - MD to check CT.  Max assist with transfers and only taking a few steps.  Slow to process and ?internally distracted and request cues.  Mod assist with cognition.  Hoping to reach min assist goals.  Revisions to Treatment Plan:  None    Continued Need for Acute Rehabilitation Level of Care: The patient requires daily medical management by a physician with specialized training in physical medicine and rehabilitation for the following conditions: Daily direction of a multidisciplinary physical rehabilitation program to ensure safe treatment while eliciting the highest outcome that is of practical value to the patient.: Yes Daily medical management of patient stability for increased activity during participation in an intensive rehabilitation regime.: Yes Daily analysis of laboratory values and/or radiology reports with any subsequent need for medication  adjustment of medical intervention for : Post surgical problems;Renal problems;Wound care problems;Diabetes problems;Blood pressure problems;Neurological problems   Cole Eastridge 09/02/2017, 12:30 PM

## 2017-09-02 NOTE — Patient Care Conference (Signed)
Inpatient RehabilitationTeam Conference and Plan of Care Update Date: 09/01/2017   Time: 3:00 PM    Patient Name: Destiny Day      Medical Record Number: [id number]  Date of Birth: 03/15/1951 Sex: Female         Room/Bed: 4W05C/4W05C-01 Payor Info: Payor: MEDICARE / Plan: MEDICARE PART A AND B / Product Type: *No Product type* /    Admitting Diagnosis: SDH  Admit Date/Time:  08/30/2017  6:06 PM Admission Comments: No comment available   Primary Diagnosis:  <principal problem not specified> Principal Problem: <principal problem not specified>  Patient Active Problem List   Diagnosis Date Noted  . Black stool   . Right sided weakness   . Subdural hemorrhage following injury ([place]) 08/30/2017  . Diabetic peripheral neuropathy ([place])   . Chronic obstructive pulmonary disease ([place])   . Seizure prophylaxis   . Subdural hematoma ([place]) 08/24/2017  . Traumatic subdural hematoma ([place]) 08/23/2017  . Fall   . SDH (subdural hematoma) ([place])   . Acute respiratory failure with hypoxia ([place])   . Diabetes mellitus type 2 in nonobese (HCC)   . Leukocytosis   . Labile blood pressure   . Generalized anxiety disorder   . Debility 08/16/2017  . Amputated great toe of right foot ([place])   . Amputated toe of left foot ([place])   . Post-operative pain   . ESRD on dialysis ([place])   . Diabetes mellitus ([place])   . Anxiety state   . Benign essential HTN   . Acute blood loss anemia   . Anemia of chronic disease   . Idiopathic chronic venous hypertension of both lower extremities with inflammation 10/13/2016  . S/P transmetatarsal amputation of foot, left ([place]) 09/21/2016  . Onychomycosis 08/15/2016  . Gangrene of toe of left foot ([place])   . PAD (peripheral artery disease) ([place]) 07/08/2016  . Atherosclerosis of native artery of left lower extremity with gangrene ([place]) 06/23/2016  . GERD (gastroesophageal reflux disease) 10/07/2015  . ARF (acute renal failure) ([place])   . Hypothermia 06/12/2015  . Acute on  chronic renal failure ([place]) 06/12/2015  . CHF (congestive heart failure) ([place]) 07/30/2014  . CKD (chronic kidney disease) stage 4, GFR 15-29 ml/min (HCC) 06/27/2014  . Hypertensive heart disease 05/25/2013  . CKD (chronic kidney disease) stage 3, GFR 30-59 ml/min (HCC) 05/25/2013  . Pulmonary edema 05/23/2013  . Type 2 diabetes mellitus with diabetic nephropathy ([place]) 05/23/2013  . Type 2 diabetes mellitus with hyperosmolar nonketotic hyperglycemia ([place]) 04/13/2013  . Chronic diastolic CHF (congestive heart failure) ([place]) 04/13/2013  . UGI bleed 03/31/2013  . Hypokalemia 08/24/2012  . Type II or unspecified type diabetes mellitus without mention of complication, not stated as uncontrolled 12/27/2007  . HLD (hyperlipidemia) 12/27/2007  . ALLERGIC RHINITIS 12/27/2007  . Asthma 12/27/2007    Expected Discharge Date: Expected Discharge Date: 09/16/17  Team Members Present: Physician leading conference: Dr. 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  Improve mobility, DM/BP, cognition, weakness  See above   Bowel/Bladder   LBM 08/31/2017 Incontinent of bowel and urine ( Oliguria) ESRD -M-W-F  Moderate assist x1,Remain  Continent of Bladder/bowel while on Rehab  Assess and monitor bladder/bowel  QS and PRN,admister medication Laxative with in 2-3 days for bowel , Oliguria-Anguia reported and monitor   Swallow/Nutrition/ Hydration             ADL's   max A overall  Supervision overall  ADL training, RUE NMR, activity and standing tolerance, functional mobility, pt education   Mobility   mod A transfers, short  distance gait, min A w/c  min A/supervision  activity tolerance, balance, attention, initiation   Communication             Safety/Cognition/ Behavioral Observations  Alert oriented x3 able to verbalize her needs to staff, coopeative. High Risk fall s.p Cranio seconday Fall, no futher reportd falls  Mod assist, no fall while on RH  High Risk for falls, monior at all times while up and educate patient,family and staff on fall prevention goal daily.    Pain   Intermittent c/o head ache with discomfort to surgical area. Administer  meds. with Tylenol and Norco alternating as needed         Skin   Surgical incision intact and healingv well (site open to air)  Prevent onset infection or surgical complications         Rehab Goals Patient on target to meet rehab goals: Yes *See Care Plan and progress notes for long and short-term goals.     Barriers to Discharge  Current Status/Progress Possible Resolutions Date Resolved   Physician    Medical stability;Hemodialysis;Wound Care     See above  Therapies, optimize BP/DM meds, follow labs, repeat head CT      Nursing                  PT  Inaccessible home environment  two level home              OT Inaccessible home environment  pt's bathroom and bedroom on 2nd floor, she can reside on 1st floor but wants to sleep upstairs             SLP                SW                Discharge Planning/Teaching Needs:  Husband taking FMLA to provide 24/7 assistance for pt at d/c.  Will need teaching with spouse prior to d/c.   Team Discussion:  Returning pt now post-neurosurgery for SDH.  Appears some weaker on right side today - MD to check CT.  Max assist with transfers and only taking a few steps.  Slow to process and ?internally distracted and request cues.  Mod assist with cognition.  Hoping to reach min assist goals.  Revisions to Treatment Plan:  None    Continued Need for Acute Rehabilitation Level of Care: The patient requires daily  medical management by a physician with specialized training in physical medicine and rehabilitation for the following conditions: Daily direction of a multidisciplinary physical rehabilitation program to ensure safe treatment while eliciting the highest outcome that is of practical value to the patient.: Yes Daily medical management of patient stability for increased activity during participation in an intensive rehabilitation regime.: Yes Daily analysis of laboratory values and/or radiology reports with any subsequent need for medication adjustment of medical intervention for : Post surgical problems;Renal problems;Wound care problems;Diabetes problems;Blood pressure problems;Neurological problems  Idell Hissong 09/02/2017, 12:30 PM

## 2017-09-02 NOTE — Progress Notes (Signed)
Social Work Patient ID: [name], female   DOB: Jun 10, 1951, 67 y.o.   MRN: [id number]   Have reviewed team conference with pt and spouse.  Both aware and agreeable with targeted d/c date of 2/21 and min assist goals. Spouse notes he will submit FMLA paperwork to begin when pt d/c's.  Continue to follow.  Isebella Upshur, LCSW

## 2017-09-03 ENCOUNTER — Ambulatory Visit (HOSPITAL_COMMUNITY): Payer: Medicare Other | Admitting: Speech Pathology

## 2017-09-03 ENCOUNTER — Inpatient Hospital Stay (HOSPITAL_COMMUNITY): Payer: Medicare Other

## 2017-09-03 ENCOUNTER — Inpatient Hospital Stay (HOSPITAL_COMMUNITY): Payer: Medicare Other | Admitting: Occupational Therapy

## 2017-09-03 DIAGNOSIS — I10 Essential (primary) hypertension: Secondary | ICD-10-CM

## 2017-09-03 LAB — RENAL FUNCTION PANEL
Albumin: 2.3 g/dL — ABNORMAL LOW (ref 3.5–5.0)
Anion gap: 15 (ref 5–15)
BUN: 39 mg/dL — ABNORMAL HIGH (ref 6–20)
CO2: 26 mmol/L (ref 22–32)
Calcium: 8.8 mg/dL — ABNORMAL LOW (ref 8.9–10.3)
Chloride: 91 mmol/L — ABNORMAL LOW (ref 101–111)
Creatinine, Ser: 4.81 mg/dL — ABNORMAL HIGH (ref 0.44–1.00)
GFR calc Af Amer: 10 mL/min — ABNORMAL LOW (ref >60)
GFR calc non Af Amer: 9 mL/min — ABNORMAL LOW (ref >60)
Glucose, Bld: 208 mg/dL — ABNORMAL HIGH (ref 65–99)
Phosphorus: 3.7 mg/dL (ref 2.5–4.6)
Potassium: 3.9 mmol/L (ref 3.5–5.1)
Sodium: 132 mmol/L — ABNORMAL LOW (ref 135–145)

## 2017-09-03 LAB — CBC
HCT: 30.6 % — ABNORMAL LOW (ref 36.0–46.0)
Hemoglobin: 9.3 g/dL — ABNORMAL LOW (ref 12.0–15.0)
MCH: 30.6 pg (ref 26.0–34.0)
MCHC: 30.4 g/dL (ref 30.0–36.0)
MCV: 100.7 fL — ABNORMAL HIGH (ref 78.0–100.0)
Platelets: 253 10*3/uL (ref 150–400)
RBC: 3.04 MIL/uL — ABNORMAL LOW (ref 3.87–5.11)
RDW: 16.5 % — ABNORMAL HIGH (ref 11.5–15.5)
WBC: 10.3 10*3/uL (ref 4.0–10.5)

## 2017-09-03 MED ORDER — SUCROFERRIC OXYHYDROXIDE 500 MG PO CHEW
1000.0000 mg | CHEWABLE_TABLET | Freq: Three times a day (TID) | ORAL | Status: DC
  Administered 2017-09-03 – 2017-09-16 (×39): 1000 mg via ORAL
  Filled 2017-09-03 (×44): qty 2

## 2017-09-03 MED ORDER — DARBEPOETIN ALFA 200 MCG/0.4ML IJ SOSY
PREFILLED_SYRINGE | INTRAMUSCULAR | Status: AC
  Administered 2017-09-03: 150 ug via INTRAVENOUS
  Filled 2017-09-03: qty 0.4

## 2017-09-03 MED ORDER — DARBEPOETIN ALFA 150 MCG/0.3ML IJ SOSY
PREFILLED_SYRINGE | INTRAMUSCULAR | Status: AC
Start: 2017-09-03 — End: 2017-09-04
  Filled 2017-09-03: qty 0.3

## 2017-09-03 NOTE — Progress Notes (Signed)
Physical Therapy Session Note  Patient Details  Name: Destiny Day MRN: [id number] Date of Birth: 28-Nov-1950  Today's Date: 09/03/2017 PT Individual Time: 1100-1200 PT Individual Time Calculation (min): 60 min   Short Term Goals: Week 1:  PT Short Term Goal 1 (Week 1): Patient to perform supine to sit with min A with rails PT Short Term Goal 2 (Week 1): Patient to transfer bed<>w/c mod A PT Short Term Goal 3 (Week 1): Patient to perform sit to stand mod A PT Short Term Goal 4 (Week 1): Patient to ambulate 46' with LRAD and mod A  Skilled Therapeutic Interventions/Progress Updates:    Pt supine in bed upon PT arrival, agreeable to therapy tx and denies pain. Pt transferred from supine>sitting EOB with min assist. Pt seated EOB to don shoes, pt able to don L shoe by herself but requiring assist to tie shoe, therapist donned R shoe total assist. Pt transferred from bed>w/c mod assist for stand pivot transfer using RW. Pt transported from room>dayroom. Pt ambulated 2 x 20 ft with mod assist for sit<>stand and then min assist during ambulation with RW. Pt transported from dayroom>gym in w/c total assist. Pt worked on dynamic standing balance without UE support to throw horseshoes with R UE, min assist for balance, x 2 trials. Pt propelled w/c x 100 ft using B UEs with min assist. Pt transferred from w/c>bed min assist stand pivot using RW. Pt transferred sitting>supine with min assist. Pt left supine in bed with needs in reach and husband present, bed alarm set.  Therapy Documentation Precautions:  Precautions Precautions: Fall Required Braces or Orthoses: Other Brace/Splint Other Brace/Splint: Per rehab notes, pt had Darco shoe for Rt LE -pt stated she did not feel safe in Darco Restrictions Weight Bearing Restrictions: Yes RLE Weight Bearing: Weight bearing as tolerated   See Function Navigator for Current Functional Status.   Therapy/Group: Individual Therapy  Netta Corrigan,  PT, DPT 09/03/2017, 10:59 AM

## 2017-09-03 NOTE — Progress Notes (Addendum)
Speech Language Pathology Daily Session Note  Patient Details  Name: Destiny Day MRN: [id number] Date of Birth: 22-May-1951  Today's Date: 09/03/2017 SLP Individual Time: 1000-1030 SLP Individual Time Calculation (min): 30 min  Short Term Goals: Week 1: SLP Short Term Goal 1 (Week 1): Pt will tolerate dys 3 textured trials with no overt s/s aspiration and efficent mastication/oral clearence over 3 observed sesions to demonstrate readiness for diet advancement. SLP Short Term Goal 2 (Week 1): Pt will demonstrate sustained attention to functional task for 10 minutes with Min A verbal cues for redirection. SLP Short Term Goal 3 (Week 1): Pt will identify 2 phsyical and 2 cognitive impairments given Min A verbal and question cues.  SLP Short Term Goal 4 (Week 1): Pt will demonstrate functional problem solving for basic, famlar (mildly complex) tasks with Mod A verbal cues.  SLP Short Term Goal 5 (Week 1): Pt will response verbal and functional to presented tasks with Min A repetition of instruction cues SLP Short Term Goal 6 (Week 1): Pt will recall novel and functional information with Mod A verbal assist for use of external aids.   Skilled Therapeutic Interventions:  Pt was seen for skilled ST targeting dysphagia goals.  SLP facilitated the session with a functional meal of dys 3 textures that husband brought from outside the hospital.  Pt had prolonged mastication secondary to missing dentition which resulted in mild oral residue.  Pt had one instance of delayed coughing following straw sip of thin liquids which pt could correctly attribute to bolus size.  Otherwise pt had s/s of adequate airway protection at bedside.  As a result, recommend advancing pt to dys 3 textures, thin liquids with intermittent supervision for use of swallowing precautions.  Pt was left in bed with bed alarm set and call bell within reach.  4 bed rails up per pt request.  Continue per current plan of care.     Function:  Eating Eating     Eating Assist Level: Set up assist for;Supervision or verbal cues   Eating Set Up Assist For: Opening containers       Cognition Comprehension Comprehension assist level: Understands basic 90% of the time/cues < 10% of the time  Expression   Expression assist level: Expresses basic needs/ideas: With extra time/assistive device  Social Interaction Social Interaction assist level: Interacts appropriately 90% of the time - Needs monitoring or encouragement for participation or interaction.  Problem Solving Problem solving assist level: Solves basic 90% of the time/requires cueing < 10% of the time  Memory Memory assist level: Recognizes or recalls 50 - 74% of the time/requires cueing 25 - 49% of the time    Pain Pain Assessment Pain Assessment: No/denies pain Pain Score: 0-No pain  Therapy/Group: Individual Therapy  Angelee Bahr, Selinda Orion 09/03/2017, 4:59 PM

## 2017-09-03 NOTE — Care Management (Signed)
Inpatient [facility] Individual Statement of Services  Patient Name:  Destiny Day  Date:  09/03/2017  Welcome to the [facility].  Our goal is to provide you with an individualized program based on your diagnosis and situation, designed to meet your specific needs.  With this comprehensive rehabilitation program, you will be expected to participate in at least 3 hours of rehabilitation therapies Monday-Friday, with modified therapy programming on the weekends.  Your rehabilitation program will include the following services:  Physical Therapy (PT), Occupational Therapy (OT), Speech Therapy (ST), 24 hour per day rehabilitation nursing, Therapeutic Recreaction (TR), Neuropsychology, Case Management (Social Worker), Rehabilitation Medicine, Nutrition Services and Pharmacy Services  Weekly team conferences will be held on Wednesdays to discuss your progress.  Your Social Worker will talk with you frequently to get your input and to update you on team discussions.  Team conferences with you and your family in attendance may also be held.  Expected length of stay: 14-18 days    Overall anticipated outcome: supervision  Depending on your progress and recovery, your program may change. Your Social Worker will coordinate services and will keep you informed of any changes. Your Social Worker's name and contact numbers are listed  below.  The following services may also be recommended but are not provided by the [facility] will be made to provide these services after discharge if needed.  Arrangements include referral to agencies that provide these services.  Your insurance has been verified to be:  Medicare and [employer] Your primary doctor is:  [place]  Pertinent information will be shared with your doctor and your  insurance company.  Social Worker:  [place], [place] or [phone]   Information discussed with and copy given to patient by: Lennart Pall, 09/03/2017, 11:20 AM

## 2017-09-03 NOTE — Progress Notes (Signed)
[facility] PHYSICAL MEDICINE & REHABILITATION     PROGRESS NOTE  Subjective/Complaints:  Patient seen lying in bed this morning. She just woke up. She states she slept well overnight.  ROS: Denies CP, SOB, N/V/D.  Objective: Vital Signs: Blood pressure 136/87, pulse 86, temperature 98.6 F (37 C), temperature source Oral, resp. rate 16, weight 66.8 kg (147 lb 4.3 oz), SpO2 96 %. No results found. Recent Labs    09/01/17 1400  WBC 11.3*  HGB 9.5*  HCT 30.6*  PLT 395   Recent Labs    09/01/17 1400  NA 132*  K 3.8  CL 93*  GLUCOSE 193*  BUN 37*  CREATININE 4.86*  CALCIUM 8.9   CBG (last 3)  Recent Labs    09/02/17 1134 09/02/17 1658 09/02/17 2140  GLUCAP 186* 222* 202*    Wt Readings from Last 3 Encounters:  09/03/17 66.8 kg (147 lb 4.3 oz)  08/30/17 69 kg (152 lb 1.9 oz)  08/23/17 69.2 kg (152 lb 8.9 oz)    Physical Exam:  BP 136/87   Pulse 86   Temp 98.6 F (37 C) (Oral)   Resp 16   Wt 66.8 kg (147 lb 4.3 oz)   SpO2 96%   BMI 24.51 kg/m  Constitutional: She appears well-developed and well-nourished. No distress.  HENT: Staples c/d/i Eyes: EOM are normal. No discharge.  Cardiovascular: RRR. No JVD. Respiratory: Effort normal and breath sounds normal.  GI: Bowel sounds are normal. She exhibits no distension.  Musculoskeletal:  Right great toe amputation  Left transmetatarsal   Neurological: She is alert.  Speech clear.  Able to follow simple motor commands without difficulty. A&O3.  Motor: RUE: 4/5 proximal to distal RLE: 3+-4- HF, KE, ADF/PF (limited by prevalon boot) LUE/LLE: 4+/5 proximal to distal  Skin: Skin is warm and dry. She is not diaphoretic.  Right great toe amputation site dusky  Left transmetatarsal site well healed.  Right groin with dressing c/d/i Psychiatric: +Disinhibition  Assessment/Plan: 1. 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 Physiatrist is providing close team supervision and 24 hour management of active medical problems listed below. Physiatrist and rehab team continue to assess barriers to discharge/monitor patient progress toward functional and medical goals.  Function:  Bathing Bathing position   Position: Wheelchair/chair at sink  Bathing parts Body parts bathed by patient: Right arm, Left arm, Chest, Abdomen Body parts bathed by helper: Right upper leg, Left upper leg, Right lower leg, Left lower leg, Back  Bathing assist        Upper Body Dressing/Undressing Upper body dressing   What is the patient wearing?: Pull over shirt/dress     Pull over shirt/dress - Perfomed by patient: Thread/unthread right sleeve, Thread/unthread left sleeve, Pull shirt over trunk Pull over shirt/dress - Perfomed by helper: Put head through opening        Upper body assist Assist Level: (total assist)      Lower Body Dressing/Undressing Lower body dressing   What is the patient wearing?: Non-skid slipper socks, Ted Hose Underwear - Performed by patient: Thread/unthread left underwear leg Underwear - Performed by helper: Thread/unthread right underwear leg, Pull underwear up/down   Pants- Performed by helper: Thread/unthread right pants leg, Thread/unthread left pants leg, Pull pants up/down Non-skid slipper socks- Performed by patient: Don/doff left sock Non-skid slipper socks- Performed by helper: Don/doff right sock  Shoes - Performed by helper: Don/doff right shoe, Don/doff left shoe, Fasten right, Fasten left     TED Hose - Performed by patient: Don/doff left TED hose TED Hose - Performed by helper: Don/doff right TED hose  Lower body assist Assist for lower body dressing: (Mod assist)      Toileting Toileting     Toileting steps completed by helper: Adjust clothing prior to toileting, Performs perineal hygiene, Adjust clothing after toileting     Toileting assist     Transfers Chair/bed transfer   Chair/bed transfer method: Stand pivot Chair/bed transfer assist level: Moderate assist (Pt 50 - 74%/lift or lower) Chair/bed transfer assistive device: Medical sales representative Ambulation activity did not occur: Safety/medical concerns   Max distance: 20 ft Assist level: Touching or steadying assistance (Pt > 75%)   Wheelchair   Type: Manual Max wheelchair distance: 50 Assist Level: Touching or steadying assistance (Pt > 75%)  Cognition Comprehension Comprehension assist level: Understands basic 90% of the time/cues < 10% of the time  Expression Expression assist level: Expresses basic needs/ideas: With extra time/assistive device  Social Interaction Social Interaction assist level: Interacts appropriately 90% of the time - Needs monitoring or encouragement for participation or interaction.  Problem Solving Problem solving assist level: Solves basic 75 - 89% of the time/requires cueing 10 - 24% of the time  Memory Memory assist level: Recognizes or recalls 50 - 74% of the time/requires cueing 25 - 49% of the time    Medical Problem List and Plan: 1.  Generalized weakness, decreased balance as well as issues with delayed processing secondary to TBI with left SDH with evacuation on 1/30.   Cont CIR   Repeat head CT reviewed, slightly improved.  No changes in management per Neurosurg 2.  DVT Prophylaxis/Anticoagulation: Mechanical: Sequential compression devices, below knee Bilateral lower extremities 3. Pain Management: Limit use of hydrocodone as confusion reported.  4. Mood: LCSW to follow for evaluation and support.  5. Neuropsych: This patient is not capable of making decisions on her own behalf. 6. Skin/Wound Care: Monitor wound for healing.  7. Fluids/Electrolytes/Nutrition: Monitor I/O. Renal diet with 1200 FR.  8. ESRD: HD MWF at end of the day to help with tolerance of therapy 9. PAD/Right femoral to popliteal  bypass: Wet to dry dressing to right groin wound bid.  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   10 T2DM: Monitor BS ac/hs. Continue to use SSI for elevated BS.   Levemir on hold currently due to poor po intake.    Added nephro with meals.    ? Trending up on 2/8 11. HTN: Monitor BP bid   Continue hydralazine and catapres   Relatively controlled on 2/8 12. Neuropathy RLE: On gabapentin.  13. Acute on chronic anemia: On Nulecit IV with HD for iron loading and aranesp weekly.    Hb 9.5 on 2/6   Cont to monitor   Labs with HD 14. COPD: Encourage IS with flutter valve. Continue Dulera.  15. Seizure prophylaxis: On keppra tid. 16. Leukocytosis   WBCs 11.3 on 2/6   Cont to monitor  17. Black stools   Hemoccult pending   Cont IV PPI for now  LOS (Days) 4 A FACE TO FACE EVALUATION WAS PERFORMED  Destiny Day Lorie Phenix 09/03/2017 12:36 PM

## 2017-09-03 NOTE — Progress Notes (Signed)
Occupational Therapy Session Note  Patient Details  Name: Destiny Day MRN: [id number] Date of Birth: 1951-05-30  Today's Date: 09/03/2017 OT Individual Time: 2353-6144 OT Individual Time Calculation (min): 56 min    Short Term Goals: Week 1:  OT Short Term Goal 1 (Week 1): Pt will be able to sit to stand with min A of 1 person from w/c for LB self care. OT Short Term Goal 2 (Week 1): Pt will be able to don pants over feet with touching A. OT Short Term Goal 3 (Week 1): Pt will be able to transfer to [facility] or toilet with min A of 1. OT Short Term Goal 4 (Week 1): Pt will actively use her R hand with grooming activities with touching A.  Skilled Therapeutic Interventions/Progress Updates:    Treatment session with focus on trunk control, functional use of dominant RUE, and LB dressing.  Pt received upright in bed reporting desire to eat breakfast.  Completed bed mobility with mod assist to progress RLE to EOB and off bed.  Pt utilized RUE with increased time to open sugar packets and peel boiled egg.  Supervision for sitting balance at EOB when reaching outside BOS to obtain items on meal tray and when engaging in LB dressing.  Educated pt on various techniques to increase success when donning TEDS. Pt able to don Lt TED in figure 4 position with use of plastic bag to decrease friction, continued to require increased time due to decreased grasp in Rt hand.  Therapist assisted with Rt due to decreased ROM, strength, and time constraints.  Engaged in lateral scoots along EOB for strengthening and to increase independence with sit > stand.  Returned to supine in bed with max assist to lift BLE.    Therapy Documentation Precautions:  Precautions Precautions: Fall Required Braces or Orthoses: Other Brace/Splint Other Brace/Splint: Per rehab notes, pt had Darco shoe for Rt LE -pt stated she did not feel safe in Darco Restrictions Weight Bearing Restrictions: Yes RLE Weight Bearing: Weight  bearing as tolerated General:   Vital Signs: Therapy Vitals BP: 136/87 Oxygen Therapy SpO2: 96 % O2 Device: Not Delivered Pain:  Pt with no c/o pain  See Function Navigator for Current Functional Status.   Therapy/Group: Individual Therapy  Simonne Come 09/03/2017, 9:39 AM

## 2017-09-03 NOTE — Progress Notes (Signed)
[facility] Progress Note  Subjective: no c/o today  Vitals:   09/02/17 1430 09/03/17 0133 09/03/17 0853 09/03/17 0921  BP: (!) 142/52 (!) 151/56 136/87   Pulse: 96 86    Resp: 18 16    Temp: 98 F (36.7 C) 98.6 F (37 C)    TempSrc: Oral Oral    SpO2: 98% 95%  96%  Weight:  66.8 kg (147 lb 4.3 oz)      Inpatient medications: . cloNIDine  0.2 mg Oral BID  . collagenase   Topical Daily  . darbepoetin (ARANESP) injection - DIALYSIS  150 mcg Intravenous Q Fri-HD  . feeding supplement (PRO-STAT SUGAR FREE 64)  30 mL Oral BID  . gabapentin  100 mg Oral QHS  . hydrALAZINE  25 mg Oral 2 times per day on Sun Tue Thu Sat  . hydrocerin   Topical BID  . mouth rinse  15 mL Mouth Rinse BID  . mometasone-formoterol  2 puff Inhalation BID  . pantoprazole  40 mg Oral Daily  . polyethylene glycol  17 g Oral Daily  . sucroferric oxyhydroxide  1,000 mg Oral TID WC   . ferric gluconate (FERRLECIT/NULECIT) IV 125 mg (09/01/17 1638)   acetaminophen, albuterol, aluminum hydroxide, bisacodyl, cloNIDine, diphenhydrAMINE, fluticasone, guaiFENesin-dextromethorphan, HYDROcodone-acetaminophen, naLOXone (NARCAN)  injection, polyethylene glycol, prochlorperazine **OR** prochlorperazine **OR** prochlorperazine, simethicone, sodium phosphate, traZODone  Exam: Alert, head wound clearn and dry,  alert, nad, husband at bedside No jvd Chest ctab RRR Abd soft ntnd R grt toe amp R groin wound some drainage noted LUA AVG+bruit  Dialysis: [facility] 4h  66.5kg  3K/2.25 bath  LUA AVG  P4  No heparin (holding x 8 wks, can resume heparin 10/21/17) Venofer 50mg  IV qwk - last 1/11 Mircera 19mcg IV q2wks - last 1/09       Impression: 1  PAD sp R fem-pop 2  SDH sp craniotomy 3  ESRD HD mwf 4  Anemia ckd, darbe 150 q Fri, fe load in prog 125mg  x 3 5  HTN stable 6  Vol at dry wt 7  DM bs's good, minimal use of SSI 8  CKD MBD, resumed velphoro   Plan - HD today, IV Fe, esa   Kelly Splinter  MD [facility] pager [phone]   09/03/2017, 10:19 AM   Recent Labs  Lab 08/29/17 0326 08/30/17 1206 09/01/17 1400  NA 136 137 132*  K 3.5 3.8 3.8  CL 96* 96* 93*  CO2 26 25 23   GLUCOSE 95 84 193*  BUN 17 5* 37*  CREATININE 3.22* 1.78* 4.86*  CALCIUM 8.5* 8.5* 8.9  PHOS 4.1 2.2* 3.5   Recent Labs  Lab 08/29/17 0326 08/30/17 1206 09/01/17 1400  ALBUMIN 2.1* 2.4* 2.3*   Recent Labs  Lab 08/28/17 0414 08/30/17 1206 09/01/17 1400  WBC 13.3* 15.3* 11.3*  HGB 8.9* 10.3* 9.5*  HCT 28.1* 32.4* 30.6*  MCV 98.9 100.6* 100.3*  PLT 471* 428* 395   Iron/TIBC/Ferritin/ %Sat    Component Value Date/Time   IRON 31 08/28/2017 0414   TIBC 140 (L) 08/28/2017 0414   FERRITIN 1,854 (H) 08/28/2017 0414   IRONPCTSAT 22 08/28/2017 0414

## 2017-09-04 ENCOUNTER — Inpatient Hospital Stay (HOSPITAL_COMMUNITY): Payer: Medicare Other | Admitting: Occupational Therapy

## 2017-09-04 ENCOUNTER — Inpatient Hospital Stay (HOSPITAL_COMMUNITY): Payer: Medicare Other | Admitting: Physical Therapy

## 2017-09-04 ENCOUNTER — Inpatient Hospital Stay (HOSPITAL_COMMUNITY): Payer: Medicare Other | Admitting: Speech Pathology

## 2017-09-04 DIAGNOSIS — D638 Anemia in other chronic diseases classified elsewhere: Secondary | ICD-10-CM

## 2017-09-04 DIAGNOSIS — B999 Unspecified infectious disease: Secondary | ICD-10-CM

## 2017-09-04 LAB — GLUCOSE, CAPILLARY
Glucose-Capillary: 212 mg/dL — ABNORMAL HIGH (ref 65–99)
Glucose-Capillary: 242 mg/dL — ABNORMAL HIGH (ref 65–99)
Glucose-Capillary: 149 mg/dL — ABNORMAL HIGH (ref 65–99)

## 2017-09-04 MED ORDER — INSULIN ASPART 100 UNIT/ML ~~LOC~~ SOLN
0.0000 [IU] | Freq: Three times a day (TID) | SUBCUTANEOUS | Status: DC
  Administered 2017-09-04: 3 [IU] via SUBCUTANEOUS
  Administered 2017-09-05: 1 [IU] via SUBCUTANEOUS
  Administered 2017-09-05 – 2017-09-07 (×3): 2 [IU] via SUBCUTANEOUS
  Administered 2017-09-07: 1 [IU] via SUBCUTANEOUS
  Administered 2017-09-07: 3 [IU] via SUBCUTANEOUS
  Administered 2017-09-08 – 2017-09-10 (×5): 2 [IU] via SUBCUTANEOUS
  Administered 2017-09-10 – 2017-09-11 (×4): 1 [IU] via SUBCUTANEOUS
  Administered 2017-09-11: 3 [IU] via SUBCUTANEOUS
  Administered 2017-09-12 (×2): 2 [IU] via SUBCUTANEOUS
  Administered 2017-09-13: 1 [IU] via SUBCUTANEOUS
  Administered 2017-09-13: 2 [IU] via SUBCUTANEOUS
  Administered 2017-09-14: 3 [IU] via SUBCUTANEOUS
  Administered 2017-09-14: 1 [IU] via SUBCUTANEOUS
  Administered 2017-09-14 – 2017-09-15 (×2): 2 [IU] via SUBCUTANEOUS
  Administered 2017-09-15 – 2017-09-16 (×2): 1 [IU] via SUBCUTANEOUS
  Administered 2017-09-16: 2 [IU] via SUBCUTANEOUS

## 2017-09-04 MED ORDER — INSULIN ASPART 100 UNIT/ML ~~LOC~~ SOLN
0.0000 [IU] | Freq: Every day | SUBCUTANEOUS | Status: DC
  Administered 2017-09-13: 2 [IU] via SUBCUTANEOUS

## 2017-09-04 NOTE — Progress Notes (Signed)
[facility] PHYSICAL MEDICINE & REHABILITATION     PROGRESS NOTE  Subjective/Complaints:  Patient seen lying in bed this morning. She slept well overnight.  ROS: Denies CP, SOB, N/V/D.  Objective: Vital Signs: Blood pressure (!) 153/58, pulse 72, temperature (!) 97.5 F (36.4 C), temperature source Oral, resp. rate 18, weight 66.8 kg (147 lb 4.3 oz), SpO2 100 %. No results found. Recent Labs    09/01/17 1400 09/03/17 1437  WBC 11.3* 10.3  HGB 9.5* 9.3*  HCT 30.6* 30.6*  PLT 395 253   Recent Labs    09/01/17 1400 09/03/17 1437  NA 132* 132*  K 3.8 3.9  CL 93* 91*  GLUCOSE 193* 208*  BUN 37* 39*  CREATININE 4.86* 4.81*  CALCIUM 8.9 8.8*   CBG (last 3)  Recent Labs    09/02/17 1134 09/02/17 1658 09/02/17 2140  GLUCAP 186* 222* 202*    Wt Readings from Last 3 Encounters:  09/04/17 66.8 kg (147 lb 4.3 oz)  08/30/17 69 kg (152 lb 1.9 oz)  08/23/17 69.2 kg (152 lb 8.9 oz)    Physical Exam:  BP (!) 153/58 (BP Location: Left Leg)   Pulse 72   Temp (!) 97.5 F (36.4 C) (Oral)   Resp 18   Wt 66.8 kg (147 lb 4.3 oz)   SpO2 100%   BMI 24.51 kg/m  Constitutional: She appears well-developed and well-nourished. No distress.  HENT: Staples c/d/i Eyes: EOM are normal. No discharge.  Cardiovascular: RRR. No JVD. Respiratory: Effort normal and breath sounds normal.  GI: Bowel sounds are normal. She exhibits no distension.  Musculoskeletal:  Right great toe amputation  Left transmetatarsal   Neurological: She is alert.  Speech clear.  Able to follow simple motor commands without difficulty. A&O3.  Motor: RUE: 4/5 proximal to distal RLE: 3/5 HF, KE, ADF/PF (limited by prevalon boot) LUE/LLE: 4+/5 proximal to distal  Skin: Skin is warm and dry. She is not diaphoretic.  Right great toe amputation site dusky  Left transmetatarsal site well healed.  Right groin with dressing c/d/i Psychiatric: +Disinhibition  Assessment/Plan: 1. Functional deficits secondary to  TBI with left SDH s/p evacuation which require 3+ hours per day of interdisciplinary therapy in a comprehensive inpatient rehab setting. Physiatrist is providing close team supervision and 24 hour management of active medical problems listed below. Physiatrist and rehab team continue to assess barriers to discharge/monitor patient progress toward functional and medical goals.  Function:  Bathing Bathing position   Position: Wheelchair/chair at sink  Bathing parts Body parts bathed by patient: Right arm, Left arm, Chest, Abdomen Body parts bathed by helper: Right upper leg, Left upper leg, Right lower leg, Left lower leg, Back  Bathing assist        Upper Body Dressing/Undressing Upper body dressing   What is the patient wearing?: Pull over shirt/dress     Pull over shirt/dress - Perfomed by patient: Thread/unthread right sleeve, Thread/unthread left sleeve, Pull shirt over trunk Pull over shirt/dress - Perfomed by helper: Put head through opening        Upper body assist Assist Level: (total assist)      Lower Body Dressing/Undressing Lower body dressing   What is the patient wearing?: Non-skid slipper socks, Ted Hose Underwear - Performed by patient: Thread/unthread left underwear leg Underwear - Performed by helper: Thread/unthread right underwear leg, Pull underwear up/down   Pants- Performed by helper: Thread/unthread right pants leg, Thread/unthread left pants leg, Pull pants up/down Non-skid slipper socks- Performed  by patient: Don/doff left sock Non-skid slipper socks- Performed by helper: Don/doff right sock       Shoes - Performed by helper: Don/doff right shoe, Don/doff left shoe, Fasten right, Fasten left     TED Hose - Performed by patient: Don/doff left TED hose TED Hose - Performed by helper: Don/doff right TED hose  Lower body assist Assist for lower body dressing: (Mod assist)      Toileting Toileting     Toileting steps completed by helper: Adjust  clothing prior to toileting, Performs perineal hygiene, Adjust clothing after toileting    Toileting assist     Transfers Chair/bed transfer   Chair/bed transfer method: Stand pivot Chair/bed transfer assist level: Moderate assist (Pt 50 - 74%/lift or lower) Chair/bed transfer assistive device: Medical sales representative Ambulation activity did not occur: Safety/medical concerns   Max distance: 20 ft Assist level: Touching or steadying assistance (Pt > 75%)   Wheelchair   Type: Manual Max wheelchair distance: 50 Assist Level: Touching or steadying assistance (Pt > 75%)  Cognition Comprehension Comprehension assist level: Understands basic 90% of the time/cues < 10% of the time  Expression Expression assist level: Expresses basic needs/ideas: With extra time/assistive device  Social Interaction Social Interaction assist level: Interacts appropriately 90% of the time - Needs monitoring or encouragement for participation or interaction.  Problem Solving Problem solving assist level: Solves basic 90% of the time/requires cueing < 10% of the time  Memory Memory assist level: Recognizes or recalls 50 - 74% of the time/requires cueing 25 - 49% of the time    Medical Problem List and Plan: 1.  Generalized weakness, decreased balance as well as issues with delayed processing secondary to TBI with left SDH with evacuation on 1/30.   Cont CIR   Repeat head CT reviewed, slightly improved.  No changes in management per Neurosurg 2.  DVT Prophylaxis/Anticoagulation: Mechanical: Sequential compression devices, below knee Bilateral lower extremities 3. Pain Management: Limit use of hydrocodone as confusion reported.  4. Mood: LCSW to follow for evaluation and support.  5. Neuropsych: This patient is not capable of making decisions on her own behalf. 6. Skin/Wound Care: Monitor wound for healing.  7. Fluids/Electrolytes/Nutrition: Monitor I/O. Renal diet with 1200 FR.  8. ESRD: HD MWF at  end of the day to help with tolerance of therapy 9. PAD/Right femoral to popliteal bypass: Wet to dry dressing to right groin wound bid.  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   10 T2DM: Monitor BS ac/hs. Continue to use SSI for elevated BS.   Levemir on hold currently due to poor po intake.    Added nephro with meals.    ? Trending up on 2/8, no recent CBGs documented 11. HTN: Monitor BP bid   Continue hydralazine and catapres   Labile, but overall controlled on 2/9 12. Neuropathy RLE: On gabapentin.  13. Acute on chronic anemia: On Nulecit IV with HD for iron loading and aranesp weekly.    Hb 9.3 on 2/8   Cont to monitor   Labs with HD 14. COPD: Encourage IS with flutter valve. Continue Dulera.  15. Seizure prophylaxis: On keppra tid. 16. Leukocytosis: Resolved   WBCs 10.3 on 2/8   Cont to monitor  17. Black stools   Hemoccult remains pending   Cont IV PPI for now  LOS (Days) 5 A FACE TO FACE EVALUATION WAS PERFORMED  Garyson Stelly Lorie Phenix 09/04/2017 7:53 AM

## 2017-09-04 NOTE — Progress Notes (Signed)
Occupational Therapy Session Note  Patient Details  Name: Destiny Day MRN: [id number] Date of Birth: Feb 22, 1951  Today's Date: 09/04/2017 OT Individual Time: 1550-2714 OT Individual Time Calculation (min): 60 min    Short Term Goals: Week 1:  OT Short Term Goal 1 (Week 1): Pt will be able to sit to stand with min A of 1 person from w/c for LB self care. OT Short Term Goal 2 (Week 1): Pt will be able to don pants over feet with touching A. OT Short Term Goal 3 (Week 1): Pt will be able to transfer to [facility] or toilet with min A of 1. OT Short Term Goal 4 (Week 1): Pt will actively use her R hand with grooming activities with touching A.  Skilled Therapeutic Interventions/Progress Updates:    treatment session focused on ADLs/self care training, transfer training, and pt education. Upon entering pt upright in w/c with daughter present and agreeable to AM ADLs. She reported she already had her feet washed. Completed UB d/b with S at sink at w/c level. Required modA with STEDY to completed LB washing at sink. Completed LB dressing at EOB with set up A and v/c for sequencing. Pt required additional time d/t fatigue. sinkside grooming completed with Set up A. Pt educated on importance of energy conservation techniques. Pt left resting in w/c with all needs met.   Therapy Documentation Precautions:  Precautions Precautions: Fall Required Braces or Orthoses: Other Brace/Splint Other Brace/Splint: Per rehab notes, pt had Darco shoe for Rt LE -pt stated she did not feel safe in Darco Restrictions Weight Bearing Restrictions: Yes RLE Weight Bearing: Weight bearing as tolerated Vital Signs: Oxygen Therapy SpO2: 98 % O2 Device: Not Delivered Pain: Pain Assessment Pain Assessment: No/denies pain ADL: ADL ADL Comments: refer to functional navigator  See Function Navigator for Current Functional Status.   Therapy/Group: Individual Therapy  Delon [place] 09/04/2017, 12:32 PM

## 2017-09-04 NOTE — Progress Notes (Signed)
Physical Therapy Session Note  Patient Details  Name: Destiny Day MRN: [id number] Date of Birth: 06-10-1951  Today's Date: 09/04/2017 PT Individual Time: 2703-5009 PT Individual Time Calculation (min): 54 min   Short Term Goals: Week 1:  PT Short Term Goal 1 (Week 1): Patient to perform supine to sit with min A with rails PT Short Term Goal 2 (Week 1): Patient to transfer bed<>w/c mod A PT Short Term Goal 3 (Week 1): Patient to perform sit to stand mod A PT Short Term Goal 4 (Week 1): Patient to ambulate 14' with LRAD and mod A  Skilled Therapeutic Interventions/Progress Updates:  Pt received in w/c in room & agreeable to tx. Pt required assistance to unlock w/c brakes. In gym pt ambulated 45 ft + 45 ft with RW & min assist for balance with heavy reliance on UE but with fairly normal gait pattern. Pt requires seated rest breaks 2/2 fatigue. Pt reports weakness in RLE but is unsure why - therapist educated pt on current diagnosis and recovery. Pt completes sit>stand transfers with steady assist. Pt engaged in threading various size beads on string with RUE and buttoning large buttons (pt reports she has trouble with this) with tasks focusing on NMR & fine motor control. Pt became tearful when engaging in button activity 2/2 difficulty of task, with therapist providing encouragement and distraction. At end of session pt left sitting in w/c in room with QRB donned, daughter present & in care of NT.  Therapy Documentation Precautions:  Precautions Precautions: Fall Required Braces or Orthoses: Other Brace/Splint Other Brace/Splint: Per rehab notes, pt had Darco shoe for Rt LE -pt stated she did not feel safe in Darco Restrictions Weight Bearing Restrictions: Yes RLE Weight Bearing: Weight bearing as tolerated  Pain: Denied c/o pain, reported "pulling" sensation in R groin during gait.  See Function Navigator for Current Functional Status.   Therapy/Group: Individual  Therapy  Waunita Schooner 09/04/2017, 4:07 PM

## 2017-09-04 NOTE — Progress Notes (Signed)
Speech Language Pathology Daily Session Note  Patient Details  Name: Destiny Day MRN: [id number] Date of Birth: [birth year]  Today's Date: 09/04/2017 SLP Individual Time: 9622-2979 SLP Individual Time Calculation (min): 43 min  Short Term Goals: Week 1: SLP Short Term Goal 1 (Week 1): Pt will tolerate dys 3 textured trials with no overt s/s aspiration and efficent mastication/oral clearence over 3 observed sesions to demonstrate readiness for diet advancement. SLP Short Term Goal 2 (Week 1): Pt will demonstrate sustained attention to functional task for 10 minutes with Min A verbal cues for redirection. SLP Short Term Goal 3 (Week 1): Pt will identify 2 phsyical and 2 cognitive impairments given Min A verbal and question cues.  SLP Short Term Goal 4 (Week 1): Pt will demonstrate functional problem solving for basic, famlar (mildly complex) tasks with Mod A verbal cues.  SLP Short Term Goal 5 (Week 1): Pt will response verbal and functional to presented tasks with Min A repetition of instruction cues SLP Short Term Goal 6 (Week 1): Pt will recall novel and functional information with Mod A verbal assist for use of external aids.   Skilled Therapeutic Interventions:   Pt was seen for skilled ST targeting dysphagia and cognition goals.  SLP facilitated the session with skilled observations completed during presentations of her currently prescribed diet with breakfast.  Pt had mild oral residuals post swallow which she cleared by alternating solids and liquids with mod I.  No overt s/s of aspiration were evident with solids or liquids.  SLP also facilitated the session with a money management task to address problem solving and recall goals.    Pt completed task with mod I for 100% accuracy.  Pt was left in wheelchair with call bell within reach and quick release belt donned.  Continue per current plan of care.     Function:  Eating Eating   Modified Consistency Diet: Yes Eating Assist  Level: More than reasonable amount of time           Cognition Comprehension Comprehension assist level: Follows basic conversation/direction with no assist  Expression   Expression assist level: Expresses basic needs/ideas: With no assist  Social Interaction Social Interaction assist level: Interacts appropriately with others with medication or extra time (anti-anxiety, antidepressant).  Problem Solving Problem solving assist level: Solves basic problems with no assist  Memory Memory assist level: Recognizes or recalls 75 - 89% of the time/requires cueing 10 - 24% of the time    Pain Pain Assessment Pain Assessment: No/denies pain  Therapy/Group: Individual Therapy  Nicolaas Savo, Selinda Orion 09/04/2017, 9:37 AM

## 2017-09-05 ENCOUNTER — Inpatient Hospital Stay (HOSPITAL_COMMUNITY): Payer: Medicare Other

## 2017-09-05 ENCOUNTER — Inpatient Hospital Stay (HOSPITAL_COMMUNITY): Payer: Medicare Other | Admitting: Occupational Therapy

## 2017-09-05 LAB — GLUCOSE, CAPILLARY
Glucose-Capillary: 154 mg/dL — ABNORMAL HIGH (ref 65–99)
Glucose-Capillary: 138 mg/dL — ABNORMAL HIGH (ref 65–99)
Glucose-Capillary: 148 mg/dL — ABNORMAL HIGH (ref 65–99)
Glucose-Capillary: 190 mg/dL — ABNORMAL HIGH (ref 65–99)

## 2017-09-05 LAB — HEMOGLOBIN A1C
Hgb A1c MFr Bld: 5 % (ref 4.8–5.6)
Mean Plasma Glucose: 97 mg/dL

## 2017-09-05 MED ORDER — HYDRALAZINE HCL 50 MG PO TABS
50.0000 mg | ORAL_TABLET | ORAL | Status: DC
  Administered 2017-09-05 – 2017-09-14 (×12): 50 mg via ORAL
  Filled 2017-09-05 (×11): qty 1

## 2017-09-05 NOTE — Progress Notes (Signed)
Physical Therapy Session Note  Patient Details  Name: Destiny Day MRN: [id number] Date of Birth: 1950-11-17  Today's Date: 09/05/2017 PT Individual Time: 1001-1059 PT Individual Time Calculation (min): 58 min   Short Term Goals: Week 1:  PT Short Term Goal 1 (Week 1): Patient to perform supine to sit with min A with rails PT Short Term Goal 2 (Week 1): Patient to transfer bed<>w/c mod A PT Short Term Goal 3 (Week 1): Patient to perform sit to stand mod A PT Short Term Goal 4 (Week 1): Patient to ambulate 65' with LRAD and mod A  Skilled Therapeutic Interventions/Progress Updates:    Pt seated in w/c asleep upon PT arrival, easily aroused and agreeable to therapy tx and denies pain. Pt transported from room>gym in w/c total assist, pt reports feeling fatigued this morning. Pt performed sit<>stand from w/c with mod assist and stand pivot transfer to mat with RW and min assist. Pt performed x 5 sit<>stands without UE support for balance and LE strengthening, mod assist and increased time to complete. Pt worked on Dietitian and R LE NMR to perform R LE sidesteps in standing without UE support, mod assist. Pt worked on standing balance to throw horse shoes with R hand, no UE support, verbal and tactile cues for upright posture/hip extension, pt only able to stand bouts of 1-2 minutes limited by fatigue. Pt transferred from mat>w/c with min assist, stand pivot using RW. Pt transported back to room total assist. Pt transferred from w/c>bed via ambulation x 5 ft with RW and min assist. Pt transferred to supine with min assist, left supine in bed with needs in reach and bed alarm set.   Therapy Documentation Precautions:  Precautions Precautions: Fall Required Braces or Orthoses: Other Brace/Splint Other Brace/Splint: Per rehab notes, pt had Darco shoe for Rt LE -pt stated she did not feel safe in Darco Restrictions Weight Bearing Restrictions: Yes RLE Weight Bearing: Weight bearing as  tolerated   See Function Navigator for Current Functional Status.   Therapy/Group: Individual Therapy  Netta Corrigan, PT, DPT 09/05/2017, 7:42 AM

## 2017-09-05 NOTE — Progress Notes (Signed)
Pt reported headache and increased fatigue with notable changes in speech patterns and delayed responses, significant change from baseline LOC, alarming husband and daughter. MD notified and CT scan s contrast ordered, negative for new hemorrhage. Cont to monitor pt for changes.

## 2017-09-05 NOTE — Progress Notes (Signed)
Occupational Therapy Session Note  Patient Details  Name: Destiny Day MRN: [id number] Date of Birth: March 07, 1951  Today's Date: 09/05/2017 OT Individual Time:0800-0900 and 1300-1330 OT Individual Time Calculation (min): 60 min and 30 min    Short Term Goals: Week 1:  OT Short Term Goal 1 (Week 1): Pt will be able to sit to stand with min A of 1 person from w/c for LB self care. OT Short Term Goal 2 (Week 1): Pt will be able to don pants over feet with touching A. OT Short Term Goal 3 (Week 1): Pt will be able to transfer to [facility] or toilet with min A of 1. OT Short Term Goal 4 (Week 1): Pt will actively use her R hand with grooming activities with touching A.  Skilled Therapeutic Interventions/Progress Updates:    Pt greeted in bed and had just finished breakfast. She had a mouth full of food and required cues and five minutes to thoroughly masticate and swallow. Pt then engaged in bathing/dressing tasks EOB. With extra time pt able to meet FM demands of managing and wringing out wash cloth. Min guard for dynamic sitting balance while washing L LE. OT assisted with R LE due to pain/weakness. Sit<stand with Mod A and RW for pericare,. Assist required for reaching buttocks. Supervision UB dressing and Max A for LB due to time constraints. Total A for Teds. Min vcs for sustained attention as she'd become externally distracted by phone or other personal items on bed. Afterwards she completed stand pivot<w/c with Mod A and RW. Pt left in wc with safety belt fastened and all needs wihtin reach.   2nd Session 1:1 tx (30 min) Pt greeted in bed with spouse present. Agreeable to tx. ADL retraining EOB with pt donning shoes. Extra time provided for pt to tie laces. Used locked w/c for elevated surface in order for her to  tie Rt shoelaces. Stand pivot<w/c completed afterwards with RW and Mod A for lifting assist. Had her engage in IADL task with bilateral UE demands to improve dynamic standing balance  and UE coordination. After she doffed 1 pillowcase and folded it, she needed to sit due to feeling "exhausted." Pt then expressed feeling discouraged by decreased endurance level and need for extra time to complete functional tasks. Provided her with therapeutic listening, emotional support, and encouragement to increase volition and feelings of self efficacy.  Pt agreeable to sit up in w/c to increase OOB tolerance. She was left with all needs and spouse present.   Therapy Documentation Precautions:  Precautions Precautions: Fall Required Braces or Orthoses: Other Brace/Splint Other Brace/Splint: Per rehab notes, pt had Darco shoe for Rt LE -pt stated she did not feel safe in Darco Restrictions Weight Bearing Restrictions: (P) Yes RLE Weight Bearing: Weight bearing as tolerated Vital Signs: Therapy Vitals Temp: 98 F (36.7 C) Temp Source: Oral Pulse Rate: 70 Resp: 17 BP: (!) 146/60 Patient Position (if appropriate): Lying Oxygen Therapy SpO2: 100 % O2 Device: Not Delivered Pain: RN providing medication during 1st session. No c/o pain during 2nd session    ADL: ADL ADL Comments: refer to functional navigator    See Function Navigator for Current Functional Status.   Therapy/Group: Individual Therapy  Riah Kehoe A Filipe Greathouse 09/05/2017, 5:17 PM

## 2017-09-05 NOTE — Progress Notes (Signed)
[facility] PHYSICAL MEDICINE & REHABILITATION     PROGRESS NOTE  Subjective/Complaints:  Patient seen lying in bed this morning. She states she slept well overnight. Yesterday patient was noted to have elevated CBGs.  ROS: Denies CP, SOB, N/V/D.  Objective: Vital Signs: Blood pressure (!) 159/63, pulse 77, temperature 97.9 F (36.6 C), temperature source Oral, resp. rate 16, weight 67 kg (147 lb 11.3 oz), SpO2 98 %. No results found. Recent Labs    09/03/17 1437  WBC 10.3  HGB 9.3*  HCT 30.6*  PLT 253   Recent Labs    09/03/17 1437  NA 132*  K 3.9  CL 91*  GLUCOSE 208*  BUN 39*  CREATININE 4.81*  CALCIUM 8.8*   CBG (last 3)  Recent Labs    09/04/17 1634 09/04/17 2126 09/05/17 0641  GLUCAP 242* 149* 154*    Wt Readings from Last 3 Encounters:  09/05/17 67 kg (147 lb 11.3 oz)  08/30/17 69 kg (152 lb 1.9 oz)  08/23/17 69.2 kg (152 lb 8.9 oz)    Physical Exam:  BP (!) 159/63 (BP Location: Left Leg)   Pulse 77   Temp 97.9 F (36.6 C) (Oral)   Resp 16   Wt 67 kg (147 lb 11.3 oz)   SpO2 98%   BMI 24.58 kg/m  Constitutional: She appears well-developed and well-nourished. No distress.  HENT: [place] c/d/i Eyes: EOM are normal. No discharge.  Cardiovascular: RRR. No JVD. Respiratory: Effort normal and breath sounds normal.  GI: Bowel sounds are normal. She exhibits no distension.  Musculoskeletal:  Right great toe amputation  Left transmetatarsal   Neurological: She is alert.  Speech clear.  Able to follow simple motor commands without difficulty. A&O3.  Motor: RUE: 4/5 proximal to distal RLE: 3/5 HF, KE, ADF/PF (limited by prevalon boot, stable) LUE/LLE: 4+/5 proximal to distal  Skin: Skin is warm and dry. She is not diaphoretic.  Right great toe amputation site dusky  Left transmetatarsal site well healed.  Right groin with dressing c/d/i Psychiatric: +Disinhibition, stable  Assessment/Plan: 1. 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 Physiatrist is providing close team supervision and 24 hour management of active medical problems listed below. Physiatrist and rehab team continue to assess barriers to discharge/monitor patient progress toward functional and medical goals.  Function:  Bathing Bathing position   Position: Wheelchair/chair at sink  Bathing parts Body parts bathed by patient: Right arm, Left arm, Chest, Abdomen, Front perineal area, Right upper leg, Left upper leg Body parts bathed by helper: Buttocks  Bathing assist Assist Level: Supervision or verbal cues      Upper Body Dressing/Undressing Upper body dressing   What is the patient wearing?: Pull over shirt/dress     Pull over shirt/dress - Perfomed by patient: Thread/unthread right sleeve, Thread/unthread left sleeve, Pull shirt over trunk Pull over shirt/dress - Perfomed by helper: Put head through opening        Upper body assist Assist Level: Touching or steadying assistance(Pt > 75%)      Lower Body Dressing/Undressing Lower body dressing   What is the patient wearing?: Pants, Shoes Underwear - Performed by patient: Thread/unthread left underwear leg Underwear - Performed by helper: Thread/unthread right underwear leg, Pull underwear up/down Pants- Performed by patient: Thread/unthread right pants leg, Thread/unthread left pants leg, Pull pants up/down Pants- Performed by helper: Thread/unthread right pants leg, Thread/unthread  left pants leg, Pull pants up/down Non-skid slipper socks- Performed by patient: Don/doff left sock Non-skid slipper socks- Performed by helper: Don/doff right sock     Shoes - Performed by patient: Fasten left, Fasten right, Don/doff left shoe Shoes - Performed by helper: Don/doff right shoe     TED Hose - Performed by patient: Don/doff left TED hose TED Hose - Performed by helper: Don/doff  right TED hose  Lower body assist Assist for lower body dressing: Touching or steadying assistance (Pt > 75%)      Toileting Toileting Toileting activity did not occur: No continent bowel/bladder event   Toileting steps completed by helper: Adjust clothing prior to toileting, Performs perineal hygiene, Adjust clothing after toileting    Toileting assist     Transfers Chair/bed transfer   Chair/bed transfer method: Stand pivot Chair/bed transfer assist level: Moderate assist (Pt 50 - 74%/lift or lower) Chair/bed transfer assistive device: Medical sales representative Ambulation activity did not occur: Safety/medical concerns   Max distance: 45 ft Assist level: Touching or steadying assistance (Pt > 75%)   Wheelchair   Type: Manual Max wheelchair distance: 50 Assist Level: Touching or steadying assistance (Pt > 75%)  Cognition Comprehension Comprehension assist level: Follows basic conversation/direction with no assist  Expression Expression assist level: Expresses basic needs/ideas: With no assist  Social Interaction Social Interaction assist level: Interacts appropriately with others with medication or extra time (anti-anxiety, antidepressant).  Problem Solving Problem solving assist level: Solves basic problems with no assist  Memory Memory assist level: Recognizes or recalls 90% of the time/requires cueing < 10% of the time    Medical Problem List and Plan: 1.  Generalized weakness, decreased balance as well as issues with delayed processing secondary to TBI with left SDH with evacuation on 1/30.   Cont CIR   Repeat head CT reviewed, slightly improved.  No changes in management per Neurosurg 2.  DVT Prophylaxis/Anticoagulation: Mechanical: Sequential compression devices, below knee Bilateral lower extremities 3. Pain Management: Limit use of hydrocodone as confusion reported.  4. Mood: LCSW to follow for evaluation and support.  5. Neuropsych: This patient is not  capable of making decisions on her own behalf. 6. Skin/Wound Care: Monitor wound for healing.  7. Fluids/Electrolytes/Nutrition: Monitor I/O. Renal diet with 1200 FR.  8. ESRD: HD MWF at end of the day to help with tolerance of therapy 9. PAD/Right femoral to popliteal bypass: Wet to dry dressing to right groin wound bid.  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   10 T2DM: Monitor BS ac/hs. Continue to use SSI for elevated BS.   Levemir 10 units at bedtime on hold currently due to variable po intake.    Added nephro with meals.    Will consider restarting Levemir if CBG's persistently elevated 11. HTN: Monitor BP bid   Hydralazine increased to 50 on 2/10   Continue catapres 12. Neuropathy RLE: On gabapentin.  13. Acute on chronic anemia: On Nulecit IV with HD for iron loading and aranesp weekly.    Hb 9.3 on 2/8   Cont to monitor   Labs with HD 14. COPD: Encourage IS with flutter valve. Continue Dulera.  15. Seizure prophylaxis: On keppra tid. 16. Leukocytosis: Resolved   WBCs 10.3 on 2/8   Cont to monitor  17. Black stools   Hemoccult remains pending on 2/10   Cont IV PPI for now  LOS (Days) 6 A FACE TO FACE EVALUATION WAS PERFORMED  Ankit Maurene Capes  Patel 09/05/2017 7:07 AM

## 2017-09-05 NOTE — Plan of Care (Signed)
  Progressing RH BOWEL ELIMINATION RH STG MANAGE BOWEL WITH ASSISTANCE Description STG Manage Bowel with  Mod I  09/05/2017 0208 - Progressing by Gwendolyn Grant, RN RH SKIN INTEGRITY RH STG SKIN FREE OF INFECTION/BREAKDOWN Description Patient and family will be able to verbalize how to keep skin free of breakdown prior to discharge  09/05/2017 0208 - Progressing by Gwendolyn Grant, RN RH STG MAINTAIN SKIN INTEGRITY WITH ASSISTANCE Description STG Maintain Skin Integrity With [place].  09/05/2017 0208 - Progressing by Gwendolyn Grant, RN RH STG ABLE TO PERFORM INCISION/WOUND CARE W/ASSISTANCE Description STG Able To Perform Incision/Wound Care With  Max  Assistance.  09/05/2017 0208 - Progressing by Gwendolyn Grant, RN RH SAFETY RH STG ADHERE TO SAFETY PRECAUTIONS W/ASSISTANCE/DEVICE Description STG Adhere to Safety Precautions With Min, Assistance/Device.  09/05/2017 0208 - Progressing by Gwendolyn Grant, RN RH STG DECREASED RISK OF FALL WITH ASSISTANCE Description STG Decreased Risk of Fall With [employer].  09/05/2017 0208 - Progressing by Gwendolyn Grant, RN RH OTHER STG SAFETY GOALS W/ASSIST Description Other STG Safety Goals With [place].regarding amputations and transfers  09/05/2017 0208 - Progressing by Gwendolyn Grant, RN RH COGNITION-NURSING RH STG USES MEMORY AIDS/STRATEGIES W/ASSIST TO PROBLEM SOLVE Description STG Uses Memory Aids/Strategies With  Min. Assistance to Problem Solve.  09/05/2017 0208 - Progressing by Gwendolyn Grant, RN RH STG ANTICIPATES NEEDS/CALLS FOR ASSIST W/ASSIST/CUES Description STG Anticipates Needs/Calls for Assist With  Mod I  09/05/2017 0208 - Progressing by Gwendolyn Grant, RN RH PAIN MANAGEMENT RH STG PAIN MANAGED AT OR BELOW PT'S PAIN GOAL Description < 3  09/05/2017 0208 - Progressing by Gwendolyn Grant, RN

## 2017-09-05 NOTE — Progress Notes (Signed)
[facility] Progress Note  Subjective: no c/o today  Vitals:   09/04/17 2018 09/05/17 0500 09/05/17 0628 09/05/17 0810  BP: (!) 147/70 (!) 166/50 (!) 159/63 (!) 168/64  Pulse:  77 77   Resp:  16    Temp:  97.9 F (36.6 C)    TempSrc:  Oral    SpO2:  98%    Weight:  67 kg (147 lb 11.3 oz)      Inpatient medications: . cloNIDine  0.2 mg Oral BID  . collagenase   Topical Daily  . darbepoetin (ARANESP) injection - DIALYSIS  150 mcg Intravenous Q Fri-HD  . feeding supplement (PRO-STAT SUGAR FREE 64)  30 mL Oral BID  . gabapentin  100 mg Oral QHS  . hydrALAZINE  50 mg Oral 2 times per day on Sun Tue Thu Sat  . hydrocerin   Topical BID  . insulin aspart  0-5 Units Subcutaneous QHS  . insulin aspart  0-9 Units Subcutaneous TID WC  . mouth rinse  15 mL Mouth Rinse BID  . mometasone-formoterol  2 puff Inhalation BID  . pantoprazole  40 mg Oral Daily  . polyethylene glycol  17 g Oral Daily  . sucroferric oxyhydroxide  1,000 mg Oral TID WC   . ferric gluconate (FERRLECIT/NULECIT) IV 125 mg (09/03/17 1530)   acetaminophen, albuterol, aluminum hydroxide, bisacodyl, cloNIDine, diphenhydrAMINE, fluticasone, guaiFENesin-dextromethorphan, HYDROcodone-acetaminophen, naLOXone (NARCAN)  injection, polyethylene glycol, prochlorperazine **OR** prochlorperazine **OR** prochlorperazine, simethicone, sodium phosphate, traZODone  Exam: Alert, no distress, calm No jvd Chest ctab RRR Abd soft ntnd R grt toe amp LUA AVG+bruit  Dialysis: [facility]WF [facility] 4h  66.5kg  3K/2.25 bath  LUA AVG  P4  No heparin (holding x 8 wks, can resume heparin 10/21/17) Venofer 50mg  IV qwk - last 1/11 Mircera 12mcg IV q2wks - last 1/09       Impression: 1  PAD sp R fem-pop 2  SDH sp craniotomy, rehab 3  ESRD HD mwf 4  Anemia ckd, darbe 150 q Fri, fe load in prog 125mg  x 3 5  HTN stable 6  Vol at dry wt 7  DM bs's good, minimal use of SSI 8  CKD MBD, resumed velphoro   Plan - HD Monday   Kelly Splinter MD [facility] pager [phone]   09/05/2017, 10:38 AM   Recent Labs  Lab 08/30/17 1206 09/01/17 1400 09/03/17 1437  NA 137 132* 132*  K 3.8 3.8 3.9  CL 96* 93* 91*  CO2 25 23 26   GLUCOSE 84 193* 208*  BUN 5* 37* 39*  CREATININE 1.78* 4.86* 4.81*  CALCIUM 8.5* 8.9 8.8*  PHOS 2.2* 3.5 3.7   Recent Labs  Lab 08/30/17 1206 09/01/17 1400 09/03/17 1437  ALBUMIN 2.4* 2.3* 2.3*   Recent Labs  Lab 08/30/17 1206 09/01/17 1400 09/03/17 1437  WBC 15.3* 11.3* 10.3  HGB 10.3* 9.5* 9.3*  HCT 32.4* 30.6* 30.6*  MCV 100.6* 100.3* 100.7*  PLT 428* 395 253   Iron/TIBC/Ferritin/ %Sat    Component Value Date/Time   IRON 31 08/28/2017 0414   TIBC 140 (L) 08/28/2017 0414   FERRITIN 1,854 (H) 08/28/2017 0414   IRONPCTSAT 22 08/28/2017 0414

## 2017-09-06 ENCOUNTER — Inpatient Hospital Stay (HOSPITAL_COMMUNITY): Payer: Medicare Other | Admitting: Occupational Therapy

## 2017-09-06 ENCOUNTER — Inpatient Hospital Stay (HOSPITAL_COMMUNITY): Payer: Medicare Other

## 2017-09-06 LAB — RENAL FUNCTION PANEL
Albumin: 2.5 g/dL — ABNORMAL LOW (ref 3.5–5.0)
Anion gap: 14 (ref 5–15)
BUN: 49 mg/dL — ABNORMAL HIGH (ref 6–20)
CO2: 25 mmol/L (ref 22–32)
Calcium: 9.1 mg/dL (ref 8.9–10.3)
Chloride: 92 mmol/L — ABNORMAL LOW (ref 101–111)
Creatinine, Ser: 5.97 mg/dL — ABNORMAL HIGH (ref 0.44–1.00)
GFR calc Af Amer: 8 mL/min — ABNORMAL LOW (ref >60)
GFR calc non Af Amer: 7 mL/min — ABNORMAL LOW (ref >60)
Glucose, Bld: 172 mg/dL — ABNORMAL HIGH (ref 65–99)
Phosphorus: 1.7 mg/dL — ABNORMAL LOW (ref 2.5–4.6)
Potassium: 3.6 mmol/L (ref 3.5–5.1)
Sodium: 131 mmol/L — ABNORMAL LOW (ref 135–145)

## 2017-09-06 LAB — CBC
HCT: 31.7 % — ABNORMAL LOW (ref 36.0–46.0)
Hemoglobin: 10 g/dL — ABNORMAL LOW (ref 12.0–15.0)
MCH: 31.9 pg (ref 26.0–34.0)
MCHC: 31.5 g/dL (ref 30.0–36.0)
MCV: 101.3 fL — ABNORMAL HIGH (ref 78.0–100.0)
Platelets: 253 10*3/uL (ref 150–400)
RBC: 3.13 MIL/uL — ABNORMAL LOW (ref 3.87–5.11)
RDW: 16.8 % — ABNORMAL HIGH (ref 11.5–15.5)
WBC: 12.7 10*3/uL — ABNORMAL HIGH (ref 4.0–10.5)

## 2017-09-06 LAB — GLUCOSE, CAPILLARY
Glucose-Capillary: 180 mg/dL — ABNORMAL HIGH (ref 65–99)
Glucose-Capillary: 186 mg/dL — ABNORMAL HIGH (ref 65–99)
Glucose-Capillary: 183 mg/dL — ABNORMAL HIGH (ref 65–99)

## 2017-09-06 MED ORDER — INSULIN DETEMIR 100 UNIT/ML ~~LOC~~ SOLN
5.0000 [IU] | Freq: Every day | SUBCUTANEOUS | Status: DC
  Administered 2017-09-06 – 2017-09-09 (×4): 5 [IU] via SUBCUTANEOUS
  Filled 2017-09-06 (×4): qty 0.05

## 2017-09-06 NOTE — Progress Notes (Signed)
Patient transported via bed to Hemodialysis; Report given to HD RN.

## 2017-09-06 NOTE — Progress Notes (Addendum)
Physical Therapy Note  Patient Details  Name: Destiny Day MRN: [id number] Date of Birth: Jan 18, 1951 Today's Date: 09/06/2017  1100-1200, 60 min individual tx Pain: none per pt  Pt seated in w/c.  W/c propulsion over level tile with min assist for steering.  PT instructed pt in turning techniques and efficient forward strokes on rims.  Stand pivot w/c> NUstep using RW, min assist.    NuSTep at level 4 x 6 minutes, rated 12 on borg scale.  PT instructed pt in body mechanics for sit> stand, as pt attempts standing before scooting forward, and does not flex at trunk enough. Sit> stand with min guard assist after instruction.    Gait training on level tile x 60' including 4 turns to L as she ambulated around tables and other obstacles.    Discussed pt's entry to home; she focused on interior steps rather than entry.  She stated she has 2 flights of 6-7 standard ht steps , L rail.  When returning from HD previously, pt stated she sat on the steps PRN for fatigue, and used L rail and cane. Despite many leading questions, pt had difficulty with her hx timeline differentiating between present admission, and previous admission.  Pt negotiated (8) 3" high steps, bil rails with min guard assist, without any knee buckling, min cues for sequencing.   Pt left resting in w/c with quick release belt applied, seat alarm set  and all needs within reach.  See function navigator for current status.  Sumayyah Custodio 09/06/2017, 11:28 AM

## 2017-09-06 NOTE — Progress Notes (Signed)
[facility]  Subjective  - Gradually getting better s/p fall with subdural hematoma.  No new complaints in regards to her right LE.   Objective (!) 150/66 83 99.1 F (37.3 C) (Oral) 16 98% No intake or output data in the 24 hours ending 09/06/17 0912  Right GT amputation site well healed  Palpable DP pulse right LE Groin with incisional dehiscence no visible graft at wound base, daily dressing changes with santyl.  Motor moving all 4 ext.  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    Roxy Horseman 09/06/2017 9:12 AM --  Laboratory Lab Results: Recent Labs    09/03/17 1437  WBC 10.3  HGB 9.3*  HCT 30.6*  PLT 253   BMET Recent Labs    09/03/17 1437  NA 132*  K 3.9  CL 91*  CO2 26  GLUCOSE 208*  BUN 39*  CREATININE 4.81*  CALCIUM 8.8*    COAG Lab Results  Component Value Date   INR 1.02 08/23/2017   INR 1.05 08/11/2017   INR 1.03 07/07/2016   No results found for: PTT

## 2017-09-06 NOTE — Plan of Care (Signed)
  Progressing RH BOWEL ELIMINATION RH STG MANAGE BOWEL WITH ASSISTANCE Description STG Manage Bowel with  Mod I  09/06/2017 0035 - Progressing by Gwendolyn Grant, RN RH STG MANAGE BOWEL W/MEDICATION W/ASSISTANCE Description STG Manage Bowel with Medication with Mod I.  09/06/2017 0035 - Progressing by Gwendolyn Grant, RN RH SKIN INTEGRITY RH STG SKIN FREE OF INFECTION/BREAKDOWN Description Patient and family will be able to verbalize how to keep skin free of breakdown prior to discharge  09/06/2017 0035 - Progressing by Gwendolyn Grant, RN RH STG MAINTAIN SKIN INTEGRITY WITH ASSISTANCE Description STG Maintain Skin Integrity With [place].  09/06/2017 0035 - Progressing by Gwendolyn Grant, RN RH SAFETY RH STG ADHERE TO SAFETY PRECAUTIONS W/ASSISTANCE/DEVICE Description STG Adhere to Safety Precautions With Min, Assistance/Device.  09/06/2017 0035 - Progressing by Gwendolyn Grant, RN RH STG DECREASED RISK OF FALL WITH ASSISTANCE Description STG Decreased Risk of Fall With [employer].  09/06/2017 0035 - Progressing by Gwendolyn Grant, RN RH COGNITION-NURSING RH STG USES MEMORY AIDS/STRATEGIES W/ASSIST TO PROBLEM SOLVE Description STG Uses Memory Aids/Strategies With  Min. Assistance to Problem Solve.  09/06/2017 0035 - Progressing by Gwendolyn Grant, RN RH STG ANTICIPATES NEEDS/CALLS FOR ASSIST W/ASSIST/CUES Description STG Anticipates Needs/Calls for Assist With  Mod I  09/06/2017 0035 - Progressing by Gwendolyn Grant, RN RH KNOWLEDGE DEFICIT BRAIN INJURY RH STG INCREASE KNOWLEDGE OF SELF CARE AFTER BRAIN INJURY Description Patient  will be able to verbalize self care of Dz process with mod assist  09/06/2017 0035 - Progressing by Gwendolyn Grant, RN

## 2017-09-06 NOTE — Progress Notes (Signed)
[facility] ROUNDING NOTE   Subjective:    End stage renal disease continues MWF dialysis  She has no complaints and is on rehab recovering from R fem pop when she developed  a Sub dural hematoma after falling in CIR 1/28  and craniotomy was perfprmed emergently .   R Fem pop site is healing well    Objective:  Vital signs in last 24 hours:  Temp:  [98 F (36.7 C)-99.1 F (37.3 C)] 99.1 F (37.3 C) (02/11 0542) Pulse Rate:  [70-83] 83 (02/11 0542) Resp:  [16-17] 16 (02/11 0542) BP: (131-164)/(60-72) 150/66 (02/11 0542) SpO2:  [98 %-100 %] 98 % (02/11 0738) Weight:  [151 lb 0.2 oz (68.5 kg)] 151 lb 0.2 oz (68.5 kg) (02/11 0542)  Weight change: 3 lb 4.9 oz (1.5 kg) Filed Weights   09/04/17 0410 09/05/17 0500 09/06/17 0542  Weight: 147 lb 4.3 oz (66.8 kg) 147 lb 11.3 oz (67 kg) 151 lb 0.2 oz (68.5 kg)    Intake/Output: I/O last 3 completed shifts: In: 120 [P.O.:120] Out: -    Intake/Output this shift:  No intake/output data recorded.  CVS- RRR RS- CTA ABD- BS present soft non-distended EXT- no edema   Basic Metabolic Panel: Recent Labs  Lab 08/30/17 1206 09/01/17 1400 09/03/17 1437  NA 137 132* 132*  K 3.8 3.8 3.9  CL 96* 93* 91*  CO2 25 23 26   GLUCOSE 84 193* 208*  BUN 5* 37* 39*  CREATININE 1.78* 4.86* 4.81*  CALCIUM 8.5* 8.9 8.8*  PHOS 2.2* 3.5 3.7    Liver Function Tests: Recent Labs  Lab 08/30/17 1206 09/01/17 1400 09/03/17 1437  ALBUMIN 2.4* 2.3* 2.3*   No results for input(s): LIPASE, AMYLASE in the last 168 hours. No results for input(s): AMMONIA in the last 168 hours.  CBC: Recent Labs  Lab 08/30/17 1206 09/01/17 1400 09/03/17 1437  WBC 15.3* 11.3* 10.3  HGB 10.3* 9.5* 9.3*  HCT 32.4* 30.6* 30.6*  MCV 100.6* 100.3* 100.7*  PLT 428* 395 253    Cardiac Enzymes: No results for input(s): CKTOTAL, CKMB, CKMBINDEX, TROPONINI in the last 168 hours.  BNP: Invalid input(s): POCBNP  CBG: Recent Labs  Lab 09/05/17 0641  09/05/17 1118 09/05/17 1650 09/05/17 2153 09/06/17 0641  GLUCAP 154* 138* 148* 190* 180*    Microbiology: Results for orders placed or performed during the hospital encounter of 08/23/17  Culture, Urine     Status: None   Collection Time: 08/24/17 10:51 AM  Result Value Ref Range Status   Specimen Description URINE, CATHETERIZED  Final   Special Requests NONE  Final   Culture NO GROWTH  Final   Report Status 08/25/2017 FINAL  Final  MRSA PCR Screening     Status: None   Collection Time: 08/27/17  2:49 AM  Result Value Ref Range Status   MRSA by PCR NEGATIVE NEGATIVE Final    Comment:        The GeneXpert MRSA Assay (FDA approved for NASAL specimens only), is one component of a comprehensive MRSA colonization surveillance program. It is not intended to diagnose MRSA infection nor to guide or monitor treatment for MRSA infections.     Coagulation Studies: No results for input(s): LABPROT, INR in the last 72 hours.  Urinalysis: No results for input(s): COLORURINE, LABSPEC, PHURINE, GLUCOSEU, HGBUR, BILIRUBINUR, KETONESUR, PROTEINUR, UROBILINOGEN, NITRITE, LEUKOCYTESUR in the last 72 hours.  Invalid input(s): APPERANCEUR    Imaging: Ct Head Wo Contrast  Result Date: 09/05/2017 CLINICAL  DATA:  Status post craniotomy.  Right facial droop. EXAM: CT HEAD WITHOUT CONTRAST TECHNIQUE: Contiguous axial images were obtained from the base of the skull through the vertex without intravenous contrast. COMPARISON:  CT head without contrast 2/6/nineteen. 08/24/2017. 08/23/2017. FINDINGS: Brain: Left extra-axial hemorrhage is stable to slightly decreased following craniotomy. No new hemorrhage is present. Maximal width on coronal images is 8 mm. Residual midline shift of 2 mm is stable. Ventricle size is stable. No acute cortical infarct is present. The basal ganglia are intact. Insular ribbon is normal. Brainstem and cerebellum are normal. Vascular: Calcifications are present within the  cavernous internal carotid arteries bilaterally. There is no hyperdense vessel. Skull: Calvarium is intact apart from the craniotomy. A low-density collection remains within the scalp adjacent to the craniotomy. There is still some air within this collection. Sinuses/Orbits: Mild mucosal thickening is present in the sphenoid sinuses bilaterally. The paranasal sinuses are otherwise clear. There is some fluid in the inferior right mastoid air cells. No obstructing nasopharyngeal lesion is present. Globes and orbits are within normal limits. IMPRESSION: 1. Stable to slight decrease in size of left extra-axial hemorrhage following craniotomy. 2. No new hemorrhage. 3. Stable 2 mm midline shift. 4. No acute infarct. 5. Atherosclerosis. 6. Mild sphenoid sinus disease. Electronically Signed   By: San Morelle M.D.   On: 09/05/2017 19:01     Medications:   . ferric gluconate (FERRLECIT/NULECIT) IV 125 mg (09/03/17 1530)   . cloNIDine  0.2 mg Oral BID  . collagenase   Topical Daily  . darbepoetin (ARANESP) injection - DIALYSIS  150 mcg Intravenous Q Fri-HD  . feeding supplement (PRO-STAT SUGAR FREE 64)  30 mL Oral BID  . gabapentin  100 mg Oral QHS  . hydrALAZINE  50 mg Oral 2 times per day on Sun Tue Thu Sat  . hydrocerin   Topical BID  . insulin aspart  0-5 Units Subcutaneous QHS  . insulin aspart  0-9 Units Subcutaneous TID WC  . mouth rinse  15 mL Mouth Rinse BID  . mometasone-formoterol  2 puff Inhalation BID  . pantoprazole  40 mg Oral Daily  . polyethylene glycol  17 g Oral Daily  . sucroferric oxyhydroxide  1,000 mg Oral TID WC   acetaminophen, albuterol, aluminum hydroxide, bisacodyl, cloNIDine, diphenhydrAMINE, fluticasone, guaiFENesin-dextromethorphan, HYDROcodone-acetaminophen, naLOXone (NARCAN)  injection, polyethylene glycol, prochlorperazine **OR** prochlorperazine **OR** prochlorperazine, simethicone, sodium phosphate, traZODone  Assessment/ Plan:  Dialysis: MWF [facility] 4h   66.5kg  3K/2.25 bath  LUA AVG  P4  No heparin (holding x 8 wks, can resume heparin 10/21/17) Venofer 50mg  IV qwk - last 1/11 Mircera 41mcg IV q2wks - last 1/09       Impression: 1  PAD sp R fem-pop 2  SDH sp craniotomy, rehab 3  ESRD HD mwf 4  Anemia ckd, darbe 150 q Fri, fe load in prog 125mg  x 3 5  HTN stable 6  Vol at dry wt 7  DM bs's good, minimal use of SSI 8  CKD MBD, resumed velphoro       LOS: 7 [place] W @TODAY @8 :38 AM

## 2017-09-06 NOTE — Plan of Care (Signed)
  Consults [facility] BRAIN INJURY PATIENT EDUCATION Description Description: See Patient Education module for eduction specifics 09/06/2017 1435 - Progressing by Dietrich Pates, RN   RH SKIN INTEGRITY RH STG MAINTAIN SKIN INTEGRITY WITH ASSISTANCE Description STG Maintain Skin Integrity With [place].  09/06/2017 1435 - Progressing by Dietrich Pates, RN   RH SAFETY RH STG ADHERE TO SAFETY PRECAUTIONS W/ASSISTANCE/DEVICE Description STG Adhere to Safety Precautions With Min, Assistance/Device.  09/06/2017 1435 - Progressing by Dietrich Pates, RN   RH COGNITION-NURSING RH STG ANTICIPATES NEEDS/CALLS FOR ASSIST W/ASSIST/CUES Description STG Anticipates Needs/Calls for Assist With  Mod I  09/06/2017 1435 - Progressing by Dietrich Pates, RN

## 2017-09-06 NOTE — Progress Notes (Signed)
Speech Language Pathology Daily Session Note  Patient Details  Name: BIRDENA KINGMA MRN: [id number] Date of Birth: [birth year]  Today's Date: 09/06/2017 SLP Individual Time: 1000-1100 SLP Individual Time Calculation (min): 60 min  Short Term Goals: Week 1: SLP Short Term Goal 1 (Week 1): Pt will tolerate dys 3 textured trials with no overt s/s aspiration and efficent mastication/oral clearence over 3 observed sesions to demonstrate readiness for diet advancement. SLP Short Term Goal 2 (Week 1): Pt will demonstrate sustained attention to functional task for 10 minutes with Min A verbal cues for redirection. SLP Short Term Goal 3 (Week 1): Pt will identify 2 phsyical and 2 cognitive impairments given Min A verbal and question cues.  SLP Short Term Goal 4 (Week 1): Pt will demonstrate functional problem solving for basic, famlar (mildly complex) tasks with Mod A verbal cues.  SLP Short Term Goal 5 (Week 1): Pt will response verbal and functional to presented tasks with Min A repetition of instruction cues SLP Short Term Goal 6 (Week 1): Pt will recall novel and functional information with Mod A verbal assist for use of external aids.   Skilled Therapeutic Interventions: Skilled St services focused on cognitive skills. SLP facilitated medication management with current medication list and 4 row pill box organizer, pt required Min-Mod physical assistance to place pills in specific days and supervision a verbal and question cues for problem solving and extra time, however unable to finish task, complete in next session due to pt perseverating on changes in medication. Pt required extra time and max A verbal cues to understand that she needs to continue taking prescribed medication and adjustments can be made to medication only by her doctor and not on her only accord. Pt stated understanding at the end of the session. Pt required Min A verbal cues for selective attention mainly due to internal  distractions. Pt was left in room with call bell within reach. Recommend to continue skilled ST services.      Function:  Eating Eating                 Cognition Comprehension Comprehension assist level: Follows basic conversation/direction with no assist  Expression   Expression assist level: Expresses basic needs/ideas: With no assist  Social Interaction Social Interaction assist level: Interacts appropriately with others with medication or extra time (anti-anxiety, antidepressant).  Problem Solving Problem solving assist level: Solves basic problems with no assist;Solves complex 90% of the time/cues < 10% of the time  Memory Memory assist level: Recognizes or recalls 25 - 49% of the time/requires cueing 50 - 75% of the time;Recognizes or recalls 50 - 74% of the time/requires cueing 25 - 49% of the time    Pain Pain Assessment Pain Assessment: No/denies pain  Therapy/Group: Individual Therapy  Sabirin Baray  [facility] 09/06/2017, 2:05 PM

## 2017-09-06 NOTE — Progress Notes (Signed)
[place] PHYSICAL MEDICINE & REHABILITATION     PROGRESS NOTE  Subjective/Complaints:  Patient seen lying in bed this morning.  Overnight last night husband and nursing with concerns about increasing lethargy.  Stat CT ordered.  Patient with questions about CT results.  She expressed that she would like to be independent again.  ROS: Denies CP, SOB, N/V/D.  Objective: Vital Signs: Blood pressure (!) 150/66, pulse 83, temperature 99.1 F (37.3 C), temperature source Oral, resp. rate 16, weight 68.5 kg (151 lb 0.2 oz), SpO2 98 %. Ct Head Wo Contrast  Result Date: 09/05/2017 CLINICAL DATA:  Status post craniotomy.  Right facial droop. EXAM: CT HEAD WITHOUT CONTRAST TECHNIQUE: Contiguous axial images were obtained from the base of the skull through the vertex without intravenous contrast. COMPARISON:  CT head without contrast 2/6/nineteen. 08/24/2017. 08/23/2017. FINDINGS: Brain: Left extra-axial hemorrhage is stable to slightly decreased following craniotomy. No new hemorrhage is present. Maximal width on coronal images is 8 mm. Residual midline shift of 2 mm is stable. Ventricle size is stable. No acute cortical infarct is present. The basal ganglia are intact. Insular ribbon is normal. Brainstem and cerebellum are normal. Vascular: Calcifications are present within the cavernous internal carotid arteries bilaterally. There is no hyperdense vessel. Skull: Calvarium is intact apart from the craniotomy. A low-density collection remains within the scalp adjacent to the craniotomy. There is still some air within this collection. Sinuses/Orbits: Mild mucosal thickening is present in the sphenoid sinuses bilaterally. The paranasal sinuses are otherwise clear. There is some fluid in the inferior right mastoid air cells. No obstructing nasopharyngeal lesion is present. Globes and orbits are within normal limits. IMPRESSION: 1. Stable to slight decrease in size of left extra-axial hemorrhage following  craniotomy. 2. No new hemorrhage. 3. Stable 2 mm midline shift. 4. No acute infarct. 5. Atherosclerosis. 6. Mild sphenoid sinus disease. Electronically Signed   By: San Morelle M.D.   On: 09/05/2017 19:01   Recent Labs    09/03/17 1437  WBC 10.3  HGB 9.3*  HCT 30.6*  PLT 253   Recent Labs    09/03/17 1437  NA 132*  K 3.9  CL 91*  GLUCOSE 208*  BUN 39*  CREATININE 4.81*  CALCIUM 8.8*   CBG (last 3)  Recent Labs    09/05/17 1650 09/05/17 2153 09/06/17 0641  GLUCAP 148* 190* 180*    Wt Readings from Last 3 Encounters:  09/06/17 68.5 kg (151 lb 0.2 oz)  08/30/17 69 kg (152 lb 1.9 oz)  08/23/17 69.2 kg (152 lb 8.9 oz)    Physical Exam:  BP (!) 150/66 (BP Location: Left Leg)   Pulse 83   Temp 99.1 F (37.3 C) (Oral)   Resp 16   Wt 68.5 kg (151 lb 0.2 oz)   SpO2 98%   BMI 25.13 kg/m  Constitutional: She appears well-developed and well-nourished. No distress.  HENT: [place] c/d/i Eyes: EOM are normal. No discharge.  Cardiovascular: RRR. No JVD. Respiratory: Effort normal and breath sounds normal.  GI: Bowel sounds are normal. She exhibits no distension.  Musculoskeletal:  Right great toe amputation  Left transmetatarsal   Neurological: She is alert.  Speech clear.  Able to follow simple motor commands without difficulty. A&O3.  Motor: RUE: 4/5 proximal to distal RLE: 3/5 HF, KE, ADF/PF  LUE/LLE: 4+/5 proximal to distal  Skin: Skin is warm and dry. She is not diaphoretic.  Right great toe amputation site dusky  Left transmetatarsal site well healed.  Right groin with dressing c/d/i Psychiatric: Normal mood, normal behavior.  Assessment/Plan: 1. 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 Physiatrist is providing close team supervision and 24 hour management of active medical problems listed below. Physiatrist and rehab  team continue to assess barriers to discharge/monitor patient progress toward functional and medical goals.  Function:  Bathing Bathing position   Position: Wheelchair/chair at sink  Bathing parts Body parts bathed by patient: Right arm, Left arm, Chest, Abdomen, Front perineal area, Right upper leg, Left upper leg, Right lower leg, Left lower leg Body parts bathed by helper: Buttocks  Bathing assist Assist Level: Touching or steadying assistance(Pt > 75%)      Upper Body Dressing/Undressing Upper body dressing   What is the patient wearing?: Pull over shirt/dress     Pull over shirt/dress - Perfomed by patient: Thread/unthread right sleeve, Thread/unthread left sleeve, Pull shirt over trunk, Put head through opening Pull over shirt/dress - Perfomed by helper: Put head through opening        Upper body assist Assist Level: Supervision or verbal cues, Set up      Lower Body Dressing/Undressing Lower body dressing   What is the patient wearing?: Non-skid slipper socks, Ted Hose, Shoes Underwear - Performed by patient: Thread/unthread left underwear leg Underwear - Performed by helper: Thread/unthread right underwear leg, Pull underwear up/down Pants- Performed by patient: Thread/unthread left pants leg Pants- Performed by helper: Thread/unthread right pants leg, Pull pants up/down Non-skid slipper socks- Performed by patient: Don/doff left sock Non-skid slipper socks- Performed by helper: Don/doff right sock     Shoes - Performed by patient: Fasten left, Fasten right, Don/doff left shoe Shoes - Performed by helper: Don/doff right shoe, Don/doff left shoe, Fasten right, Fasten left(due to time)     TED Hose - Performed by patient: Don/doff left TED hose TED Hose - Performed by helper: Don/doff left TED hose, Don/doff right TED hose(due to time)  Lower body assist Assist for lower body dressing: Touching or steadying assistance (Pt > 75%)(min assist for standing balance, total  assist)      Toileting Toileting Toileting activity did not occur: No continent bowel/bladder event   Toileting steps completed by helper: Adjust clothing prior to toileting, Performs perineal hygiene, Adjust clothing after toileting    Toileting assist     Transfers Chair/bed transfer   Chair/bed transfer method: Stand pivot Chair/bed transfer assist level: Touching or steadying assistance (Pt > 75%) Chair/bed transfer assistive device: Medical sales representative Ambulation activity did not occur: Safety/medical concerns   Max distance: 11 ft Assist level: Touching or steadying assistance (Pt > 75%)   Wheelchair   Type: Manual Max wheelchair distance: 50 Assist Level: Touching or steadying assistance (Pt > 75%)  Cognition Comprehension Comprehension assist level: Follows basic conversation/direction with no assist  Expression Expression assist level: Expresses basic needs/ideas: With no assist  Social Interaction Social Interaction assist level: Interacts appropriately with others with medication or extra time (anti-anxiety, antidepressant).  Problem Solving Problem solving assist level: Solves basic problems with no assist  Memory Memory assist level: Recognizes or recalls 90% of the time/requires cueing < 10% of the time    Medical Problem List and Plan: 1.  Generalized weakness, decreased balance as well as issues with delayed processing secondary to TBI with left SDH with evacuation on 1/30.   Cont CIR  Repeat head CT reviewed, slightly improved.  No changes in management per Neurosurg, repeat CT again on 2/10 reviewed, showing stable/improving bleed. 2.  DVT Prophylaxis/Anticoagulation: Mechanical: Sequential compression devices, below knee Bilateral lower extremities 3. Pain Management: Limit use of hydrocodone as confusion reported.  4. Mood: LCSW to follow for evaluation and support.  5. Neuropsych: This patient is not capable of making decisions on her own  behalf. 6. Skin/Wound Care: Monitor wound for healing.  7. Fluids/Electrolytes/Nutrition: Monitor I/O. Renal diet with 1200 FR.  8. ESRD: HD MWF at end of the day to help with tolerance of therapy 9. PAD/Right femoral to popliteal bypass: Wet to dry dressing to right groin wound bid.  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   10 T2DM: Monitor BS ac/hs. Continue to use SSI for elevated BS.   Levemir 10 units at bedtime on hold currently due to variable po intake.    Added nephro with meals.    Levemir 5 units at bedtime started on 2/11 (10 units PTA) 11. HTN: Monitor BP bid   Hydralazine increased to 50 on 2/10   Continue catapres   Overall improving on 2/11 12. Neuropathy RLE: On gabapentin.  13. Acute on chronic anemia: On Nulecit IV with HD for iron loading and aranesp weekly.    Hb 9.3 on 2/8   Cont to monitor   Labs with HD, pending for today 14. COPD: Encourage IS with flutter valve. Continue Dulera.  15. Seizure prophylaxis: On keppra tid. 16. Leukocytosis: Resolved   WBCs 10.3 on 2/8   Cont to monitor  17. Black stools   Hemoccult remains pending on 2/11   Cont IV PPI for now  LOS (Days) 7 A FACE TO FACE EVALUATION WAS PERFORMED  Ankit Lorie Phenix 09/06/2017 10:10 AM

## 2017-09-06 NOTE — Progress Notes (Signed)
Occupational Therapy Session Note  Patient Details  Name: Destiny Day MRN: [id number] Date of Birth: 21-Dec-1950  Today's Date: 09/06/2017 OT Individual Time: 6546-5035 OT Individual Time Calculation (min): 60 min    Short Term Goals: Week 1:  OT Short Term Goal 1 (Week 1): Pt will be able to sit to stand with min A of 1 person from w/c for LB self care. OT Short Term Goal 2 (Week 1): Pt will be able to don pants over feet with touching A. OT Short Term Goal 3 (Week 1): Pt will be able to transfer to [facility] or toilet with min A of 1. OT Short Term Goal 4 (Week 1): Pt will actively use her R hand with grooming activities with touching A.  Skilled Therapeutic Interventions/Progress Updates:    Treatment session with focus on sit <> stand and increased independence with self-care tasks of bathing and dressing.  Pt received upright in bed having finished breakfast.  Pt asking questions regarding OT goals and purpose of bathing/dressing - reiterated focus on increased independence with balance, mobility, and self-care tasks.  Pt completed bed mobility with min assist, to advance RLE.  Min assist and max cues for sequencing stand pivot transfer to w/c.  Pt very fearful of falling during transfer.  Completed bathing and dressing at sit <> stand level at sink with pt required min assist for each sit > stand.  Touching assist when crossing RLE over Lt to wash and don Rt sock.  Setup to open toothpaste secondary to decreased strength in RUE.  Pt left upright in w/c with chair alarm and quick release belt on and all needs in reach.  Therapy Documentation Precautions:  Precautions Precautions: Fall Required Braces or Orthoses: Other Brace/Splint Other Brace/Splint: Per rehab notes, pt had Darco shoe for Rt LE -pt stated she did not feel safe in Darco Restrictions Weight Bearing Restrictions: Yes RLE Weight Bearing: Weight bearing as tolerated General:   Vital Signs: Oxygen Therapy SpO2: 98  % O2 Device: Not Delivered Pain:  Pt with no c/o pain  See Function Navigator for Current Functional Status.   Therapy/Group: Individual Therapy  Simonne Come 09/06/2017, 9:42 AM

## 2017-09-07 ENCOUNTER — Inpatient Hospital Stay (HOSPITAL_COMMUNITY): Payer: Medicare Other | Admitting: Occupational Therapy

## 2017-09-07 ENCOUNTER — Inpatient Hospital Stay (HOSPITAL_COMMUNITY): Payer: Medicare Other | Admitting: Speech Pathology

## 2017-09-07 ENCOUNTER — Inpatient Hospital Stay (HOSPITAL_COMMUNITY): Payer: Medicare Other | Admitting: Physical Therapy

## 2017-09-07 LAB — GLUCOSE, CAPILLARY
Glucose-Capillary: 135 mg/dL — ABNORMAL HIGH (ref 65–99)
Glucose-Capillary: 217 mg/dL — ABNORMAL HIGH (ref 65–99)
Glucose-Capillary: 186 mg/dL — ABNORMAL HIGH (ref 65–99)
Glucose-Capillary: 144 mg/dL — ABNORMAL HIGH (ref 65–99)

## 2017-09-07 LAB — OCCULT BLOOD X 1 CARD TO LAB, STOOL: Fecal Occult Bld: NEGATIVE

## 2017-09-07 NOTE — Progress Notes (Signed)
[facility] PHYSICAL MEDICINE & REHABILITATION     PROGRESS NOTE  Subjective/Complaints:  Patient seen lying bed this morning. She states she slept well overnight. She has no complaints this morning.  ROS: Denies CP, SOB, N/V/D.  Objective: Vital Signs: Blood pressure (!) 183/93, pulse 72, temperature 98.1 F (36.7 C), temperature source Oral, resp. rate 18, weight 66.3 kg (146 lb 2.6 oz), SpO2 98 %. Ct Head Wo Contrast  Result Date: 09/05/2017 CLINICAL DATA:  Status post craniotomy.  Right facial droop. EXAM: CT HEAD WITHOUT CONTRAST TECHNIQUE: Contiguous axial images were obtained from the base of the skull through the vertex without intravenous contrast. COMPARISON:  CT head without contrast 2/6/nineteen. 08/24/2017. 08/23/2017. FINDINGS: Brain: Left extra-axial hemorrhage is stable to slightly decreased following craniotomy. No new hemorrhage is present. Maximal width on coronal images is 8 mm. Residual midline shift of 2 mm is stable. Ventricle size is stable. No acute cortical infarct is present. The basal ganglia are intact. Insular ribbon is normal. Brainstem and cerebellum are normal. Vascular: Calcifications are present within the cavernous internal carotid arteries bilaterally. There is no hyperdense vessel. Skull: Calvarium is intact apart from the craniotomy. A low-density collection remains within the scalp adjacent to the craniotomy. There is still some air within this collection. Sinuses/Orbits: Mild mucosal thickening is present in the sphenoid sinuses bilaterally. The paranasal sinuses are otherwise clear. There is some fluid in the inferior right mastoid air cells. No obstructing nasopharyngeal lesion is present. Globes and orbits are within normal limits. IMPRESSION: 1. Stable to slight decrease in size of left extra-axial hemorrhage following craniotomy. 2. No new hemorrhage. 3. Stable 2 mm midline shift. 4. No acute infarct. 5. Atherosclerosis. 6. Mild sphenoid sinus disease.  Electronically Signed   By: San Morelle M.D.   On: 09/05/2017 19:01   Recent Labs    09/06/17 1315  WBC 12.7*  HGB 10.0*  HCT 31.7*  PLT 253   Recent Labs    09/06/17 1315  NA 131*  K 3.6  CL 92*  GLUCOSE 172*  BUN 49*  CREATININE 5.97*  CALCIUM 9.1   CBG (last 3)  Recent Labs    09/06/17 1215 09/06/17 2032 09/07/17 0701  GLUCAP 186* 183* 135*    Wt Readings from Last 3 Encounters:  09/07/17 66.3 kg (146 lb 2.6 oz)  08/30/17 69 kg (152 lb 1.9 oz)  08/23/17 69.2 kg (152 lb 8.9 oz)    Physical Exam:  BP (!) 183/93   Pulse 72   Temp 98.1 F (36.7 C) (Oral)   Resp 18   Wt 66.3 kg (146 lb 2.6 oz)   SpO2 98%   BMI 24.32 kg/m  Constitutional: She appears well-developed and well-nourished. No distress.  HENT: Staples c/d/i Eyes: EOM are normal. No discharge.  Cardiovascular: RRR. No JVD. Respiratory: Effort normal and breath sounds normal.  GI: Bowel sounds are normal. She exhibits no distension.  Musculoskeletal:  Right great toe amputation  Left transmetatarsal   Neurological: She is alert.  Speech clear.  Able to follow simple motor commands without difficulty. A&O3.  Motor: RUE: 4/5 proximal to distal RLE: 3+/5 HF, KE, ADF/PF  LUE/LLE: 4+/5 proximal to distal  Skin: Skin is warm and dry. She is not diaphoretic.  Right great toe amputation site dusky  Left transmetatarsal site well healed.  Right groin with dressing c/d/i Psychiatric: Normal mood, normal behavior.  Assessment/Plan: 1. 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 Physiatrist is providing close team supervision and 24 hour management of active medical problems listed below. Physiatrist and rehab team continue to assess barriers to discharge/monitor patient progress toward functional and medical goals.  Function:  Bathing Bathing position   Position:  Wheelchair/chair at sink  Bathing parts Body parts bathed by patient: Right arm, Left arm, Chest, Abdomen, Right upper leg, Left upper leg Body parts bathed by helper: Buttocks  Bathing assist Assist Level: Touching or steadying assistance(Pt > 75%)      Upper Body Dressing/Undressing Upper body dressing   What is the patient wearing?: Pull over shirt/dress     Pull over shirt/dress - Perfomed by patient: Thread/unthread right sleeve, Thread/unthread left sleeve, Pull shirt over trunk Pull over shirt/dress - Perfomed by helper: Put head through opening        Upper body assist Assist Level: Touching or steadying assistance(Pt > 75%)      Lower Body Dressing/Undressing Lower body dressing   What is the patient wearing?: Pants Underwear - Performed by patient: Thread/unthread left underwear leg Underwear - Performed by helper: Thread/unthread right underwear leg, Pull underwear up/down Pants- Performed by patient: Thread/unthread right pants leg, Thread/unthread left pants leg, Pull pants up/down Pants- Performed by helper: Thread/unthread right pants leg, Pull pants up/down Non-skid slipper socks- Performed by patient: Don/doff left sock Non-skid slipper socks- Performed by helper: Don/doff right sock     Shoes - Performed by patient: Fasten left, Fasten right, Don/doff left shoe Shoes - Performed by helper: Don/doff right shoe, Don/doff left shoe, Fasten right, Fasten left(due to time)     TED Hose - Performed by patient: Don/doff left TED hose TED Hose - Performed by helper: Don/doff left TED hose, Don/doff right TED hose(due to time)  Lower body assist Assist for lower body dressing: Touching or steadying assistance (Pt > 75%)      Toileting Toileting Toileting activity did not occur: No continent bowel/bladder event   Toileting steps completed by helper: Adjust clothing prior to toileting, Performs perineal hygiene, Adjust clothing after toileting    Toileting assist      Transfers Chair/bed transfer   Chair/bed transfer method: Stand pivot Chair/bed transfer assist level: Touching or steadying assistance (Pt > 75%) Chair/bed transfer assistive device: Walker, Air cabin crew Ambulation activity did not occur: Safety/medical concerns   Max distance: 60 Assist level: Touching or steadying assistance (Pt > 75%)   Wheelchair   Type: Manual Max wheelchair distance: 150 Assist Level: Touching or steadying assistance (Pt > 75%)  Cognition Comprehension Comprehension assist level: Follows basic conversation/direction with no assist  Expression Expression assist level: Expresses basic needs/ideas: With no assist  Social Interaction Social Interaction assist level: Interacts appropriately with others with medication or extra time (anti-anxiety, antidepressant).  Problem Solving Problem solving assist level: Solves basic problems with no assist, Solves complex 90% of the time/cues < 10% of the time  Memory Memory assist level: Recognizes or recalls 25 - 49% of the time/requires cueing 50 - 75% of the time, Recognizes or recalls 50 - 74% of the time/requires cueing 25 - 49% of the time    Medical Problem List and Plan: 1.  Generalized weakness, decreased balance as well as issues with delayed processing secondary to TBI with left SDH with evacuation on 1/30.   Cont CIR   Repeat head CT reviewed, slightly improved.  No changes in management per Neurosurg, repeat  CT again on 2/10 reviewed, showing stable/improving bleed. 2.  DVT Prophylaxis/Anticoagulation: Mechanical: Sequential compression devices, below knee Bilateral lower extremities 3. Pain Management: Limit use of hydrocodone as confusion reported.  4. Mood: LCSW to follow for evaluation and support.  5. Neuropsych: This patient is not capable of making decisions on her own behalf. 6. Skin/Wound Care: Monitor wound for healing.  7. Fluids/Electrolytes/Nutrition: Monitor I/O. Renal  diet with 1200 FR.  8. ESRD: HD MWF at end of the day to help with tolerance of therapy 9. PAD/Right femoral to popliteal bypass: Wet to dry dressing to right groin wound bid.  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   10 T2DM: Monitor BS ac/hs. Continue to use SSI for elevated BS.   Levemir 10 units at bedtime on hold currently due to variable po intake.    Added nephro with meals.    Levemir 5 units at bedtime started on 2/11 (10 units PTA)   Improving on 2/12 11. HTN: Monitor BP bid   Hydralazine increased to 50 on 2/10   Continue catapres   Hypertensive crisis this AM, however patient missed doses of AP meds yesterday. 12. Neuropathy RLE: On gabapentin.  13. Acute on chronic anemia: On Nulecit IV with HD for iron loading and aranesp weekly.    Hb 10.0 on 2/11   Cont to monitor   Labs with HD 14. COPD: Encourage IS with flutter valve. Continue Dulera.  15. Seizure prophylaxis: On keppra tid. 16. Leukocytosis:    WBCs 12.7 on 2/11   Cont to monitor  17. Black stools   Hemoccult remains pending on 2/12, discussed with nursing   Cont IV PPI for now  LOS (Days) 8 A FACE TO FACE EVALUATION WAS PERFORMED  Kahil Agner Lorie Phenix 09/07/2017 10:23 AM

## 2017-09-07 NOTE — Progress Notes (Signed)
Speech Language Pathology Weekly Progress and Session Note  Patient Details  Name: Destiny Day MRN: [id number] Date of Birth: 1951-07-12  Beginning of progress report period:  August 31, 2017 End of progress report period: September 07, 2017   Today's Date: 09/07/2017 SLP Individual Time: 6256-3893 SLP Individual Time Calculation (min): 55 min  Short Term Goals: Week 1: SLP Short Term Goal 1 (Week 1): Pt will tolerate dys 3 textured trials with no overt s/s aspiration and efficent mastication/oral clearence over 3 observed sesions to demonstrate readiness for diet advancement. SLP Short Term Goal 1 - Progress (Week 1): Met SLP Short Term Goal 2 (Week 1): Pt will demonstrate sustained attention to functional task for 10 minutes with Min A verbal cues for redirection. SLP Short Term Goal 2 - Progress (Week 1): Met SLP Short Term Goal 3 (Week 1): Pt will identify 2 phsyical and 2 cognitive impairments given Min A verbal and question cues.  SLP Short Term Goal 3 - Progress (Week 1): Met SLP Short Term Goal 4 (Week 1): Pt will demonstrate functional problem solving for basic, famlar (mildly complex) tasks with Mod A verbal cues.  SLP Short Term Goal 4 - Progress (Week 1): Met SLP Short Term Goal 5 (Week 1): Pt will response verbal and functional to presented tasks with Min A repetition of instruction cues SLP Short Term Goal 5 - Progress (Week 1): Met SLP Short Term Goal 6 (Week 1): Pt will recall novel and functional information with Mod A verbal assist for use of external aids.  SLP Short Term Goal 6 - Progress (Week 1): Met    New Short Term Goals: Week 2: SLP Short Term Goal 1 (Week 2): Pt will consume regular textures with mod I use of swallowing precautions and no overt s/s of aspiration  SLP Short Term Goal 2 (Week 2): Pt will demonstrate functional problem solving for semi-complex tasks with supervision verbal cues.  SLP Short Term Goal 3 (Week 2): Pt will recall novel and  functional information with supervision verbal cues for use of external aids.  SLP Short Term Goal 4 (Week 2): Pt will anticipate potential barriers in the home environment and generate safe solutions with supervision cues.    Weekly Progress Updates:   Pt has made steady, functional gains this reporting period and has met 6 out of 6 short term goals.  Pt is currently min assist for mildly complex tasks due to mild cognitive deficits  Specifically related to recall, awareness, and problem solving.  Pt has demonstrated improved attention to tasks, recall of daily information related to her medical care, and intellectual awareness of her current limitations.  Pt is consuming a dys 3, thin liquids diet with mod I cues for use of swallowing precautions.  Pt and family education is ongoing.  Pt would continue to benefit from skilled ST while inpatient in order to maximize functional independence and reduce burden of care prior to discharge.  Anticipate that pt will need 24/7 supervision at discharge in addition to [facility]kins follow at next level of care.      Intensity: Minumum of 1-2 x/day, 30 to 90 minutes Frequency: 3 to 5 out of 7 days Duration/Length of Stay: 18-21 days Treatment/Interventions: Cognitive remediation/compensation;Cueing hierarchy;Dysphagia/aspiration precaution training;Functional tasks   Daily Session  Skilled Therapeutic Interventions: Pt was seen for skilled ST targeting cognitive goals.  SLP attempted medication management with pt; however, she remains quite frustrated and upset by changes to her home medication regimen.  Therefore task was abandoned.  SLP instead facilitated the session with a novel scheduling task targeting goals for recall and problem solving.  Pt needed min assist verbal cues for working memory of task procedures as well as for attention to detail.  Task was not completed due to time constraints.  Will plan to complete at next available appointment.  Pt also consumed  a chicken biscuit and Sprite for breakfast with more than a reasonable amount of time needed to clear residual solids from the oral cavity.    Pt was returned to room and left in wheelchair with quick release belt donned and call bell within reach.  Husband at bedside.  Goals updated on this date to reflect current progress and plan of care.        Function:   Eating Eating   Modified Consistency Diet: Yes Eating Assist Level: More than reasonable amount of time;Set up assist for   Eating Set Up Assist For: Opening containers       Cognition Comprehension Comprehension assist level: Follows basic conversation/direction with no assist  Expression   Expression assist level: Expresses basic needs/ideas: With no assist  Social Interaction Social Interaction assist level: Interacts appropriately with others with medication or extra time (anti-anxiety, antidepressant).  Problem Solving Problem solving assist level: Solves basic 75 - 89% of the time/requires cueing 10 - 24% of the time  Memory Memory assist level: Recognizes or recalls 75 - 89% of the time/requires cueing 10 - 24% of the time   General    Pain Pain Assessment Pain Assessment: No/denies pain  Therapy/Group: Individual Therapy  Bama Hanselman, Selinda Orion 09/07/2017, 12:15 PM

## 2017-09-07 NOTE — Progress Notes (Signed)
Occupational Therapy Session Note  Patient Details  Name: Destiny Day MRN: [id number] Date of Birth: [birth year]  Today's Date: 09/07/2017 OT Individual Time: 6063-0160 OT Individual Time Calculation (min): 60 min    Short Term Goals: Week 1:  OT Short Term Goal 1 (Week 1): Pt will be able to sit to stand with min A of 1 person from w/c for LB self care. OT Short Term Goal 2 (Week 1): Pt will be able to don pants over feet with touching A. OT Short Term Goal 3 (Week 1): Pt will be able to transfer to [facility] or toilet with min A of 1. OT Short Term Goal 4 (Week 1): Pt will actively use her R hand with grooming activities with touching A.  Skilled Therapeutic Interventions/Progress Updates:    Treatment session with focus on functional mobility, sit <> stand, and LB dressing.  Pt received upright in bed with no reports of pain.  Pt completed bed mobility with increased time and supervision this session, demonstrating increased ability to manage and maneuver RLE.  Pt completed stand step transfer with standard walker from bed to w/c with improved stability and confidence, requiring min assist for stability and safety.  Engaged in bathing and dressing at sit > stand level at sink with pt demonstrating improved standing balance and tolerance, even standing to doff gown.  Required min assist to don shirt due to tight head hole over staples.  Pt demonstrated improved ability to thread pant legs this session, utilizing figure 4 position for increased stability and independence.  Pt left at sink to complete grooming tasks with chair alarm on and quick release belt intact, nursing student arriving to complete medications.  Therapy Documentation Precautions:  Precautions Precautions: Fall Required Braces or Orthoses: Other Brace/Splint Other Brace/Splint: Per rehab notes, pt had Darco shoe for Rt LE -pt stated she did not feel safe in Darco Restrictions Weight Bearing Restrictions: Yes RLE Weight  Bearing: Weight bearing as tolerated General:   Vital Signs: Therapy Vitals Temp: 98.1 F (36.7 C) Temp Source: Oral Pulse Rate: 72 Resp: 18 BP: (!) 183/93 Patient Position (if appropriate): Lying Oxygen Therapy SpO2: 98 % O2 Device: Not Delivered Pain: Pain Assessment Pain Assessment: No/denies pain  See Function Navigator for Current Functional Status.   Therapy/Group: Individual Therapy  Simonne Come 09/07/2017, 9:14 AM

## 2017-09-07 NOTE — Progress Notes (Signed)
[facility] ROUNDING NOTE   Subjective:    End stage renal disease continues MWF dialysis  She has no complaints and is on rehab recovering from R fem pop when she developed  a Sub dural hematoma after falling in CIR 1/28  and craniotomy was perfprmed emergently .   R Fem pop site is healing well    Objective:  Vital signs in last 24 hours:  Temp:  [97.7 F (36.5 C)-99.3 F (37.4 C)] 97.7 F (36.5 C) (02/12 1340) Pulse Rate:  [71-87] 73 (02/12 1340) Resp:  [18] 18 (02/12 1340) BP: (120-183)/(56-93) 140/71 (02/12 1340) SpO2:  [96 %-100 %] 100 % (02/12 1340) Weight:  [146 lb 2.6 oz (66.3 kg)-146 lb 3.2 oz (66.3 kg)] 146 lb 2.6 oz (66.3 kg) (02/12 0655)  Weight change: 0 lb (0 kg) Filed Weights   09/06/17 1700 09/06/17 2125 09/07/17 0655  Weight: 146 lb 2.6 oz (66.3 kg) 146 lb 3.2 oz (66.3 kg) 146 lb 2.6 oz (66.3 kg)    Intake/Output: I/O last 3 completed shifts: In: 120 [P.O.:120] Out: 2000 [Other:2000]   Intake/Output this shift:  No intake/output data recorded.  CVS- RRR RS- CTA ABD- BS present soft non-distended EXT- no edema   Basic Metabolic Panel: Recent Labs  Lab 09/01/17 1400 09/03/17 1437 09/06/17 1315  NA 132* 132* 131*  K 3.8 3.9 3.6  CL 93* 91* 92*  CO2 23 26 25   GLUCOSE 193* 208* 172*  BUN 37* 39* 49*  CREATININE 4.86* 4.81* 5.97*  CALCIUM 8.9 8.8* 9.1  PHOS 3.5 3.7 1.7*    Liver Function Tests: Recent Labs  Lab 09/01/17 1400 09/03/17 1437 09/06/17 1315  ALBUMIN 2.3* 2.3* 2.5*   No results for input(s): LIPASE, AMYLASE in the last 168 hours. No results for input(s): AMMONIA in the last 168 hours.  CBC: Recent Labs  Lab 09/01/17 1400 09/03/17 1437 09/06/17 1315  WBC 11.3* 10.3 12.7*  HGB 9.5* 9.3* 10.0*  HCT 30.6* 30.6* 31.7*  MCV 100.3* 100.7* 101.3*  PLT 395 253 253    Cardiac Enzymes: No results for input(s): CKTOTAL, CKMB, CKMBINDEX, TROPONINI in the last 168 hours.  BNP: Invalid input(s):  POCBNP  CBG: Recent Labs  Lab 09/06/17 0641 09/06/17 1215 09/06/17 2032 09/07/17 0701 09/07/17 1146  GLUCAP 180* 186* 183* 135* 69*    Microbiology: Results for orders placed or performed during the hospital encounter of 08/23/17  Culture, Urine     Status: None   Collection Time: 08/24/17 10:51 AM  Result Value Ref Range Status   Specimen Description URINE, CATHETERIZED  Final   Special Requests NONE  Final   Culture NO GROWTH  Final   Report Status 08/25/2017 FINAL  Final  MRSA PCR Screening     Status: None   Collection Time: 08/27/17  2:49 AM  Result Value Ref Range Status   MRSA by PCR NEGATIVE NEGATIVE Final    Comment:        The GeneXpert MRSA Assay (FDA approved for NASAL specimens only), is one component of a comprehensive MRSA colonization surveillance program. It is not intended to diagnose MRSA infection nor to guide or monitor treatment for MRSA infections.     Coagulation Studies: No results for input(s): LABPROT, INR in the last 72 hours.  Urinalysis: No results for input(s): COLORURINE, LABSPEC, PHURINE, GLUCOSEU, HGBUR, BILIRUBINUR, KETONESUR, PROTEINUR, UROBILINOGEN, NITRITE, LEUKOCYTESUR in the last 72 hours.  Invalid input(s): APPERANCEUR    Imaging: Ct Head Wo Contrast  Result  Date: 09/05/2017 CLINICAL DATA:  Status post craniotomy.  Right facial droop. EXAM: CT HEAD WITHOUT CONTRAST TECHNIQUE: Contiguous axial images were obtained from the base of the skull through the vertex without intravenous contrast. COMPARISON:  CT head without contrast 2/6/nineteen. 08/24/2017. 08/23/2017. FINDINGS: Brain: Left extra-axial hemorrhage is stable to slightly decreased following craniotomy. No new hemorrhage is present. Maximal width on coronal images is 8 mm. Residual midline shift of 2 mm is stable. Ventricle size is stable. No acute cortical infarct is present. The basal ganglia are intact. Insular ribbon is normal. Brainstem and cerebellum are normal.  Vascular: Calcifications are present within the cavernous internal carotid arteries bilaterally. There is no hyperdense vessel. Skull: Calvarium is intact apart from the craniotomy. A low-density collection remains within the scalp adjacent to the craniotomy. There is still some air within this collection. Sinuses/Orbits: Mild mucosal thickening is present in the sphenoid sinuses bilaterally. The paranasal sinuses are otherwise clear. There is some fluid in the inferior right mastoid air cells. No obstructing nasopharyngeal lesion is present. Globes and orbits are within normal limits. IMPRESSION: 1. Stable to slight decrease in size of left extra-axial hemorrhage following craniotomy. 2. No new hemorrhage. 3. Stable 2 mm midline shift. 4. No acute infarct. 5. Atherosclerosis. 6. Mild sphenoid sinus disease. Electronically Signed   By: San Morelle M.D.   On: 09/05/2017 19:01     Medications:    . cloNIDine  0.2 mg Oral BID  . collagenase   Topical Daily  . darbepoetin (ARANESP) injection - DIALYSIS  150 mcg Intravenous Q Fri-HD  . feeding supplement (PRO-STAT SUGAR FREE 64)  30 mL Oral BID  . gabapentin  100 mg Oral QHS  . hydrALAZINE  50 mg Oral 2 times per day on Sun Tue Thu Sat  . hydrocerin   Topical BID  . insulin aspart  0-5 Units Subcutaneous QHS  . insulin aspart  0-9 Units Subcutaneous TID WC  . insulin detemir  5 Units Subcutaneous QHS  . mouth rinse  15 mL Mouth Rinse BID  . mometasone-formoterol  2 puff Inhalation BID  . pantoprazole  40 mg Oral Daily  . polyethylene glycol  17 g Oral Daily  . sucroferric oxyhydroxide  1,000 mg Oral TID WC   acetaminophen, albuterol, aluminum hydroxide, bisacodyl, cloNIDine, diphenhydrAMINE, fluticasone, guaiFENesin-dextromethorphan, HYDROcodone-acetaminophen, naLOXone (NARCAN)  injection, polyethylene glycol, prochlorperazine **OR** prochlorperazine **OR** prochlorperazine, simethicone, sodium phosphate, traZODone  Assessment/ Plan:   Dialysis: [facility] 4h  66.5kg  3K/2.25 bath  LUA AVG  P4  No heparin (holding x 8 wks, can resume heparin 10/21/17) Venofer 50mg  IV qwk - last 1/11 Mircera 78mcg IV q2wks - last 1/09       Impression: 1  PAD sp R fem-pop 2  SDH sp craniotomy, rehab 3  ESRD HD mwf 4  Anemia ckd, darbe 150 q Fri, fe load in prog 125mg  x 3 5  HTN stable 6  Vol at dry wt 7  DM bs's good, minimal use of SSI 8  CKD MBD, resumed velphoro    Continues to do well in rehab  Will plan on dialysis in the AM   LOS: 8 Jefferie Holston W @TODAY @2 :23 PM

## 2017-09-07 NOTE — Progress Notes (Signed)
Physical Therapy Session Note  Patient Details  Name: Destiny Day MRN: [id number] Date of Birth: 09-20-1950  Today's Date: 09/07/2017 PT Individual Time: 1345-1415 PT Individual Time Calculation (min): 30 min   Short Term Goals: Week 1:  PT Short Term Goal 1 (Week 1): Patient to perform supine to sit with min A with rails PT Short Term Goal 2 (Week 1): Patient to transfer bed<>w/c mod A PT Short Term Goal 3 (Week 1): Patient to perform sit to stand mod A PT Short Term Goal 4 (Week 1): Patient to ambulate 60' with LRAD and mod A  Skilled Therapeutic Interventions/Progress Updates:   Pt in w/c and agreeable to therapy, denies pain. Total assist w/c transport to/from gym for time management. Worked on standing tolerance in gym while performing cognitive task emphasizing motor and spatial planning and attention to task. Able to tolerate 5-7 minutes of standing at a time before requiring seated rest break, intermittent UE support on high/low table w/ close supervision for safety. Returned to room and PT provided set-up assist for pericare as pt had episode of urinary incontinence, min guard to close supervision for standing balance w/ UE support on RW. Ended session in w/c, QRB engaged, call bell within reach and all needs met.   Therapy Documentation Precautions:  Precautions Precautions: Fall Required Braces or Orthoses: Other Brace/Splint Other Brace/Splint: Per rehab notes, pt had Darco shoe for Rt LE -pt stated she did not feel safe in Darco Restrictions Weight Bearing Restrictions: No RLE Weight Bearing: Weight bearing as tolerated Vital Signs: Therapy Vitals Temp: 97.7 F (36.5 C) Temp Source: Oral Pulse Rate: 73 Resp: 18 BP: 140/71 Patient Position (if appropriate): Sitting Oxygen Therapy SpO2: 100 % O2 Device: Not Delivered Pain: Pain Assessment Pain Assessment: No/denies pain  See Function Navigator for Current Functional Status.   Therapy/Group: Individual  Therapy  Mitesh Rosendahl K Arnette 09/07/2017, 2:41 PM

## 2017-09-08 ENCOUNTER — Inpatient Hospital Stay (HOSPITAL_COMMUNITY): Payer: Medicare Other | Admitting: Physical Therapy

## 2017-09-08 ENCOUNTER — Inpatient Hospital Stay (HOSPITAL_COMMUNITY): Payer: Medicare Other | Admitting: Occupational Therapy

## 2017-09-08 ENCOUNTER — Inpatient Hospital Stay (HOSPITAL_COMMUNITY): Payer: Medicare Other | Admitting: Speech Pathology

## 2017-09-08 ENCOUNTER — Inpatient Hospital Stay (HOSPITAL_COMMUNITY): Payer: Medicare Other | Admitting: *Deleted

## 2017-09-08 DIAGNOSIS — R7309 Other abnormal glucose: Secondary | ICD-10-CM | POA: Insufficient documentation

## 2017-09-08 DIAGNOSIS — R0989 Other specified symptoms and signs involving the circulatory and respiratory systems: Secondary | ICD-10-CM

## 2017-09-08 LAB — GLUCOSE, CAPILLARY
Glucose-Capillary: 114 mg/dL — ABNORMAL HIGH (ref 65–99)
Glucose-Capillary: 199 mg/dL — ABNORMAL HIGH (ref 65–99)
Glucose-Capillary: 117 mg/dL — ABNORMAL HIGH (ref 65–99)

## 2017-09-08 LAB — RENAL FUNCTION PANEL
Albumin: 2.5 g/dL — ABNORMAL LOW (ref 3.5–5.0)
Anion gap: 12 (ref 5–15)
BUN: 39 mg/dL — ABNORMAL HIGH (ref 6–20)
CO2: 26 mmol/L (ref 22–32)
Calcium: 9.1 mg/dL (ref 8.9–10.3)
Chloride: 93 mmol/L — ABNORMAL LOW (ref 101–111)
Creatinine, Ser: 5.1 mg/dL — ABNORMAL HIGH (ref 0.44–1.00)
GFR calc Af Amer: 9 mL/min — ABNORMAL LOW (ref >60)
GFR calc non Af Amer: 8 mL/min — ABNORMAL LOW (ref >60)
Glucose, Bld: 137 mg/dL — ABNORMAL HIGH (ref 65–99)
Phosphorus: 1.4 mg/dL — ABNORMAL LOW (ref 2.5–4.6)
Potassium: 4 mmol/L (ref 3.5–5.1)
Sodium: 131 mmol/L — ABNORMAL LOW (ref 135–145)

## 2017-09-08 LAB — CBC
HCT: 32.9 % — ABNORMAL LOW (ref 36.0–46.0)
Hemoglobin: 10 g/dL — ABNORMAL LOW (ref 12.0–15.0)
MCH: 31.3 pg (ref 26.0–34.0)
MCHC: 30.4 g/dL (ref 30.0–36.0)
MCV: 102.8 fL — ABNORMAL HIGH (ref 78.0–100.0)
Platelets: 267 10*3/uL (ref 150–400)
RBC: 3.2 MIL/uL — ABNORMAL LOW (ref 3.87–5.11)
RDW: 17 % — ABNORMAL HIGH (ref 11.5–15.5)
WBC: 11.7 10*3/uL — ABNORMAL HIGH (ref 4.0–10.5)

## 2017-09-08 MED ORDER — ACETAMINOPHEN 325 MG PO TABS
ORAL_TABLET | ORAL | Status: AC
  Administered 2017-09-08: 650 mg via ORAL
  Filled 2017-09-08: qty 2

## 2017-09-08 NOTE — Progress Notes (Signed)
Speech Language Pathology Daily Session Note  Patient Details  Name: Destiny Day MRN: [id number] Date of Birth: 1951-05-24  Today's Date: 09/08/2017 SLP Individual Time: 7893-8101 SLP Individual Time Calculation (min): 55 min  Short Term Goals: Week 2: SLP Short Term Goal 1 (Week 2): Pt will consume regular textures with mod I use of swallowing precautions and no overt s/s of aspiration  SLP Short Term Goal 2 (Week 2): Pt will demonstrate functional problem solving for semi-complex tasks with supervision verbal cues.  SLP Short Term Goal 3 (Week 2): Pt will recall novel and functional information with supervision verbal cues for use of external aids.  SLP Short Term Goal 4 (Week 2): Pt will anticipate potential barriers in the home environment and generate safe solutions with supervision cues.    Skilled Therapeutic Interventions:  Pt was seen for skilled ST targeting cognitive goals.  Pt was received from OT, sitting on commode.  She requested help appropriately before standing up once she was finished.  Pt also independently recalled that RN was waiting from stool sample.  RN was made aware but unfortunately sample was unable to be obtained due to specimen falling outside of collection hat.  Pt was transferred back to wheelchair, shortly after which pt's husband arrived voicing concerns about dispo, stating "there's no sense in her going home if she can't get up and walk around."  SLP reinforced teams recommendations regarding 24/7 supervision at discharge and instructed pt's husband to direct further questions regarding pt's mobility to pt's primary PT.  SLP also provided skilled education regarding rationale for ST interventions while inpatient in order to maximize pt's functional independence for cognitive function.  Pt was able to problem solve at least 1 basic, general solution to potential barrier upon discharge regarding showering and/or sink bathing with supervision question cues. All  questions were answered to pt's and husband's satisfaction at this time.  Pt was left in wheelchair with quick release belt donned, call bell within reach, and husband at bedside.  Continue per current plan of care.    Function:  Eating Eating                 Cognition Comprehension Comprehension assist level: Follows complex conversation/direction with extra time/assistive device  Expression   Expression assist level: Expresses complex 90% of the time/cues < 10% of the time  Social Interaction Social Interaction assist level: Interacts appropriately with others with medication or extra time (anti-anxiety, antidepressant).  Problem Solving Problem solving assist level: Solves basic 90% of the time/requires cueing < 10% of the time  Memory Memory assist level: Recognizes or recalls 75 - 89% of the time/requires cueing 10 - 24% of the time    Pain Pain Assessment Pain Assessment: No/denies pain  Therapy/Group: Individual Therapy  Netta Fodge, Selinda Orion 09/08/2017, 10:43 AM

## 2017-09-08 NOTE — Progress Notes (Signed)
Physical Therapy Session Note  Patient Details  Name: Destiny Day MRN: [id number] Date of Birth: 03-24-1951  Today's Date: 09/08/2017 PT Individual Time: 9643-8381 PT Individual Time Calculation (min): 30 min   Short Term Goals: Week 1:  PT Short Term Goal 1 (Week 1): Patient to perform supine to sit with min A with rails PT Short Term Goal 2 (Week 1): Patient to transfer bed<>w/c mod A PT Short Term Goal 3 (Week 1): Patient to perform sit to stand mod A PT Short Term Goal 4 (Week 1): Patient to ambulate 31' with LRAD and mod A  Skilled Therapeutic Interventions/Progress Updates:    no c/o pain.  Session focus on activity tolerance with repeated sit<>stand (min assist and verbal cues for forward weight shift) and standing balance during Wii bowling activity.  Pt returned to room at end of session and positioned in w/c with QRB in place, chair alarm activated and needs met.   Therapy Documentation Precautions:  Precautions Precautions: Fall Required Braces or Orthoses: Other Brace/Splint Other Brace/Splint: Per rehab notes, pt had Darco shoe for Rt LE -pt stated she did not feel safe in Darco Restrictions Weight Bearing Restrictions: Yes RLE Weight Bearing: Weight bearing as tolerated   See Function Navigator for Current Functional Status.   Therapy/Group: Individual Therapy  Michel Santee 09/08/2017, 1:03 PM

## 2017-09-08 NOTE — Progress Notes (Signed)
[facility] ROUNDING NOTE   Subjective:    End stage renal disease continues MWF dialysis  She has no complaints and is on rehab recovering from R fem pop when she developed  a Sub dural hematoma after falling in CIR 1/28  and craniotomy was perfprmed emergently .   R Fem pop site is healing well  No complaints and no new issues this am    Objective:  Vital signs in last 24 hours:  Temp:  [97.7 F (36.5 C)-98.2 F (36.8 C)] 98.2 F (36.8 C) (02/13 0643) Pulse Rate:  [63-75] 63 (02/13 0643) Resp:  [18] 18 (02/13 0643) BP: (140-162)/(52-88) 144/52 (02/13 0643) SpO2:  [98 %-100 %] 99 % (02/13 0930) Weight:  [146 lb 6.2 oz (66.4 kg)] 146 lb 6.2 oz (66.4 kg) (02/13 0643)  Weight change: -10.1 oz (-2.1 kg) Filed Weights   09/06/17 2125 09/07/17 0655 09/08/17 0643  Weight: 146 lb 3.2 oz (66.3 kg) 146 lb 2.6 oz (66.3 kg) 146 lb 6.2 oz (66.4 kg)    Intake/Output: I/O last 3 completed shifts: In: 600 [P.O.:600] Out: 1 [Stool:1]   Intake/Output this shift:  No intake/output data recorded.  CVS- RRR RS- CTA ABD- BS present soft non-distended EXT- no edema   Basic Metabolic Panel: Recent Labs  Lab 09/01/17 1400 09/03/17 1437 09/06/17 1315  NA 132* 132* 131*  K 3.8 3.9 3.6  CL 93* 91* 92*  CO2 23 26 25   GLUCOSE 193* 208* 172*  BUN 37* 39* 49*  CREATININE 4.86* 4.81* 5.97*  CALCIUM 8.9 8.8* 9.1  PHOS 3.5 3.7 1.7*    Liver Function Tests: Recent Labs  Lab 09/01/17 1400 09/03/17 1437 09/06/17 1315  ALBUMIN 2.3* 2.3* 2.5*   No results for input(s): LIPASE, AMYLASE in the last 168 hours. No results for input(s): AMMONIA in the last 168 hours.  CBC: Recent Labs  Lab 09/01/17 1400 09/03/17 1437 09/06/17 1315  WBC 11.3* 10.3 12.7*  HGB 9.5* 9.3* 10.0*  HCT 30.6* 30.6* 31.7*  MCV 100.3* 100.7* 101.3*  PLT 395 253 253    Cardiac Enzymes: No results for input(s): CKTOTAL, CKMB, CKMBINDEX, TROPONINI in the last 168 hours.  BNP: Invalid  input(s): POCBNP  CBG: Recent Labs  Lab 09/07/17 0701 09/07/17 1146 09/07/17 1653 09/07/17 2050 09/08/17 0614  GLUCAP 135* 217* 186* 144* 114*    Microbiology: Results for orders placed or performed during the hospital encounter of 08/23/17  Culture, Urine     Status: None   Collection Time: 08/24/17 10:51 AM  Result Value Ref Range Status   Specimen Description URINE, CATHETERIZED  Final   Special Requests NONE  Final   Culture NO GROWTH  Final   Report Status 08/25/2017 FINAL  Final  MRSA PCR Screening     Status: None   Collection Time: 08/27/17  2:49 AM  Result Value Ref Range Status   MRSA by PCR NEGATIVE NEGATIVE Final    Comment:        The GeneXpert MRSA Assay (FDA approved for NASAL specimens only), is one component of a comprehensive MRSA colonization surveillance program. It is not intended to diagnose MRSA infection nor to guide or monitor treatment for MRSA infections.     Coagulation Studies: No results for input(s): LABPROT, INR in the last 72 hours.  Urinalysis: No results for input(s): COLORURINE, LABSPEC, PHURINE, GLUCOSEU, HGBUR, BILIRUBINUR, KETONESUR, PROTEINUR, UROBILINOGEN, NITRITE, LEUKOCYTESUR in the last 72 hours.  Invalid input(s): APPERANCEUR    Imaging: No results  found.   Medications:    . cloNIDine  0.2 mg Oral BID  . collagenase   Topical Daily  . darbepoetin (ARANESP) injection - DIALYSIS  150 mcg Intravenous Q Fri-HD  . feeding supplement (PRO-STAT SUGAR FREE 64)  30 mL Oral BID  . gabapentin  100 mg Oral QHS  . hydrALAZINE  50 mg Oral 2 times per day on Sun Tue Thu Sat  . hydrocerin   Topical BID  . insulin aspart  0-5 Units Subcutaneous QHS  . insulin aspart  0-9 Units Subcutaneous TID WC  . insulin detemir  5 Units Subcutaneous QHS  . mouth rinse  15 mL Mouth Rinse BID  . mometasone-formoterol  2 puff Inhalation BID  . pantoprazole  40 mg Oral Daily  . polyethylene glycol  17 g Oral Daily  . sucroferric  oxyhydroxide  1,000 mg Oral TID WC   acetaminophen, albuterol, aluminum hydroxide, bisacodyl, cloNIDine, diphenhydrAMINE, fluticasone, guaiFENesin-dextromethorphan, HYDROcodone-acetaminophen, naLOXone (NARCAN)  injection, polyethylene glycol, prochlorperazine **OR** prochlorperazine **OR** prochlorperazine, simethicone, sodium phosphate, traZODone  Assessment/ Plan:  Dialysis: [facility] 4h  66.5kg  3K/2.25 bath  LUA AVG  P4  No heparin (holding x 8 wks, can resume heparin 10/21/17) Venofer 50mg  IV qwk - last 1/11 Mircera 52mcg IV q2wks - last 1/09       Impression: 1  PAD sp R fem-pop 2  SDH sp craniotomy, rehab 3  ESRD HD mwf 4  Anemia ckd, darbe 150 q Fri, fe load in prog 125mg  x 3 5  HTN stable 6  Vol at dry wt 7  DM bs's good, minimal use of SSI 8  CKD MBD, resumed velphoro   Continues to do well in rehab  Will plan on dialysis    LOS: 9 Akram Kissick W @TODAY @10 :44 AM

## 2017-09-08 NOTE — Progress Notes (Signed)
Physical Therapy Note  Patient Details  Name: Destiny Day MRN: [id number] Date of Birth: 1951/01/17 Today's Date: 09/08/2017    Time: 1030-1100 30 minutes  1:1 Pt c/o headache, states she doesn't want pain meds at this time.  Stair negotiation 2 x 4 steps with 1 handrail and SPC with mod A.  Pt very fatigued and needs extended rests between sets.  W/c mobility with bilat UEs with supervision, increased time x 50'.   DONAWERTH,KAREN 09/08/2017, 11:00 AM

## 2017-09-08 NOTE — Progress Notes (Signed)
Occupational Therapy Weekly Progress Note  Patient Details  Name: Destiny Day MRN: [id number] Date of Birth: 1950-12-31  Beginning of progress report period: August 31, 2017 End of progress report period: September 08, 2017  Today's Date: 09/08/2017 OT Individual Time: 4599-7741 OT Individual Time Calculation (min): 65 min    Patient has met 4 of 4 short term goals.  Pt is making steady progress towards goals.  Pt currently requires min assist for sit > stand and stand step transfers with standard walker.  Pt progressing with improved activity tolerance and mobility.  Progressing towards supervision in standing with UE support, requiring intermittent min assist for standing without UE support.  Pt able to thread pants, continues to require assist with pulling pants over hips and donning Rt sock/shoe due to RLE weakness.  Pt is demonstrating improved strength and coordination with RUE with improved ability to open containers, still demonstrated decreased gross grasp as needed to don socks and pull up pants.    Patient continues to demonstrate the following deficits: muscle weakness, decreased cardiorespiratoy endurance, decreased coordination, delayed processing and decreased standing balance, decreased postural control, hemiplegia and decreased balance strategies and therefore will continue to benefit from skilled OT intervention to enhance overall performance with BADL and Reduce care partner burden.  Patient progressing toward long term goals..  Continue plan of care.  OT Short Term Goals Week 1:  OT Short Term Goal 1 (Week 1): Pt will be able to sit to stand with min A of 1 person from w/c for LB self care. OT Short Term Goal 1 - Progress (Week 1): Met OT Short Term Goal 2 (Week 1): Pt will be able to don pants over feet with touching A. OT Short Term Goal 2 - Progress (Week 1): Met OT Short Term Goal 3 (Week 1): Pt will be able to transfer to [facility] or toilet with min A of 1. OT Short  Term Goal 3 - Progress (Week 1): Met OT Short Term Goal 4 (Week 1): Pt will actively use her R hand with grooming activities with touching A. OT Short Term Goal 4 - Progress (Week 1): Met Week 2:  OT Short Term Goal 1 (Week 2): STG = LTGs due to remaining LOS  Skilled Therapeutic Interventions/Progress Updates:    Treatment session with focus on dynamic sitting and standing balance during self-care tasks and functional use of RUE.  Pt received supine in bed asleep.  Completed bed mobility with supervision.  Pt declined bathing this session, completed dressing at sit > stand level at EOB.  Pt able to thread B pant legs and doff hospital socks, required assist to don Rt sock due to decreased strength in RLE and decreased grasp in RUE.  Pt required assistance to pull pants over hips in standing due to tight fit of pants.  Completed stand step transfer with standard walker to w/c with min assist.  Set up for breakfast with pt able to open containers with increased time but no physical assistance.  Pt reports need to toilet.  Stand pivot transfer with standard walker with min guard and pt able to doff pants for toileting.  Left pt on toilet with SLP present at end of session to finish toileting task.  Therapy Documentation Precautions:  Precautions Precautions: Fall Required Braces or Orthoses: Other Brace/Splint Other Brace/Splint: Per rehab notes, pt had Darco shoe for Rt LE -pt stated she did not feel safe in Darco Restrictions Weight Bearing Restrictions: Yes RLE Weight Bearing:  Weight bearing as tolerated General:   Vital Signs: Therapy Vitals Temp: 98.2 F (36.8 C) Temp Source: Oral Pulse Rate: 63 Resp: 18 BP: (!) 144/52 Patient Position (if appropriate): Lying Oxygen Therapy SpO2: 99 % O2 Device: Not Delivered Pain:  Pt with no c/o pain  See Function Navigator for Current Functional Status.   Therapy/Group: Individual Therapy  Simonne Come 09/08/2017, 8:26 AM

## 2017-09-08 NOTE — Progress Notes (Signed)
[place] PHYSICAL MEDICINE & REHABILITATION     PROGRESS NOTE  Subjective/Complaints:  Patient seen lying in bed this morning. She states she slept well overnight, but is still sleepy this morning.  ROS: Denies CP, SOB, N/V/D.  Objective: Vital Signs: Blood pressure (!) 144/52, pulse 63, temperature 98.2 F (36.8 C), temperature source Oral, resp. rate 18, weight 66.4 kg (146 lb 6.2 oz), SpO2 99 %. No results found. Recent Labs    09/06/17 1315  WBC 12.7*  HGB 10.0*  HCT 31.7*  PLT 253   Recent Labs    09/06/17 1315  NA 131*  K 3.6  CL 92*  GLUCOSE 172*  BUN 49*  CREATININE 5.97*  CALCIUM 9.1   CBG (last 3)  Recent Labs    09/07/17 1653 09/07/17 2050 09/08/17 0614  GLUCAP 186* 144* 114*    Wt Readings from Last 3 Encounters:  09/08/17 66.4 kg (146 lb 6.2 oz)  08/30/17 69 kg (152 lb 1.9 oz)  08/23/17 69.2 kg (152 lb 8.9 oz)    Physical Exam:  BP (!) 144/52 (BP Location: Left Leg)   Pulse 63   Temp 98.2 F (36.8 C) (Oral)   Resp 18   Wt 66.4 kg (146 lb 6.2 oz)   SpO2 99%   BMI 24.36 kg/m  Constitutional: She appears well-developed and well-nourished. No distress.  HENT: [place] c/d/i Eyes: EOM are normal. No discharge.  Cardiovascular: RRR. No JVD. Respiratory: Effort normal and breath sounds normal.  GI: Bowel sounds are normal. She exhibits no distension.  Musculoskeletal:  Right great toe amputation  Left transmetatarsal   Neurological: She is alert.  Speech clear.  Able to follow simple motor commands without difficulty. A&O3.  Motor: RUE: 4/5 proximal to distal RLE: 3+/5 HF, KE, ADF/PF (stable) LUE/LLE: 4+/5 proximal to distal  Skin: Skin is warm and dry. She is not diaphoretic.  Right great toe amputation site dusky  Left transmetatarsal site well healed.  Right groin with dressing c/d/i Psychiatric: Normal mood, normal behavior.  Assessment/Plan: 1. 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 Physiatrist is providing close team supervision and 24 hour management of active medical problems listed below. Physiatrist and rehab team continue to assess barriers to discharge/monitor patient progress toward functional and medical goals.  Function:  Bathing Bathing position   Position: Wheelchair/chair at sink  Bathing parts Body parts bathed by patient: Right arm, Left arm, Chest, Abdomen, Right upper leg, Left upper leg Body parts bathed by helper: Buttocks  Bathing assist Assist Level: Touching or steadying assistance(Pt > 75%)      Upper Body Dressing/Undressing Upper body dressing   What is the patient wearing?: Pull over shirt/dress     Pull over shirt/dress - Perfomed by patient: Thread/unthread right sleeve, Thread/unthread left sleeve, Pull shirt over trunk Pull over shirt/dress - Perfomed by helper: Put head through opening        Upper body assist Assist Level: Touching or steadying assistance(Pt > 75%)      Lower Body Dressing/Undressing Lower body dressing   What is the patient wearing?: Pants Underwear - Performed by patient: Thread/unthread left underwear leg Underwear - Performed by helper: Thread/unthread right underwear leg, Pull underwear up/down Pants- Performed by patient: Thread/unthread right pants leg, Thread/unthread left pants leg, Pull pants up/down Pants- Performed by helper: Thread/unthread right pants leg, Pull pants up/down Non-skid slipper socks- Performed  by patient: Don/doff left sock Non-skid slipper socks- Performed by helper: Don/doff right sock     Shoes - Performed by patient: Fasten left, Fasten right, Don/doff left shoe Shoes - Performed by helper: Don/doff right shoe, Don/doff left shoe, Fasten right, Fasten left(due to time)     TED Hose - Performed by patient: Don/doff left TED hose TED Hose - Performed by helper: Don/doff left TED hose,  Don/doff right TED hose(due to time)  Lower body assist Assist for lower body dressing: Touching or steadying assistance (Pt > 75%)      Toileting Toileting Toileting activity did not occur: No continent bowel/bladder event   Toileting steps completed by helper: Adjust clothing prior to toileting, Performs perineal hygiene, Adjust clothing after toileting    Toileting assist     Transfers Chair/bed transfer   Chair/bed transfer method: Stand pivot Chair/bed transfer assist level: Touching or steadying assistance (Pt > 75%) Chair/bed transfer assistive device: Walker, Air cabin crew Ambulation activity did not occur: Safety/medical concerns   Max distance: 60 Assist level: Touching or steadying assistance (Pt > 75%)   Wheelchair   Type: Manual Max wheelchair distance: 150 Assist Level: Touching or steadying assistance (Pt > 75%)  Cognition Comprehension Comprehension assist level: Follows basic conversation/direction with no assist  Expression Expression assist level: Expresses basic needs/ideas: With no assist  Social Interaction Social Interaction assist level: Interacts appropriately with others with medication or extra time (anti-anxiety, antidepressant).  Problem Solving Problem solving assist level: Solves basic 75 - 89% of the time/requires cueing 10 - 24% of the time  Memory Memory assist level: Recognizes or recalls 75 - 89% of the time/requires cueing 10 - 24% of the time    Medical Problem List and Plan: 1.  Generalized weakness, decreased balance as well as issues with delayed processing secondary to TBI with left SDH with evacuation on 1/30.   Cont CIR   Repeat head CT reviewed, slightly improved.  No changes in management per Neurosurg, repeat CT again on 2/10 reviewed, showing stable/improving bleed. 2.  DVT Prophylaxis/Anticoagulation: Mechanical: Sequential compression devices, below knee Bilateral lower extremities 3. Pain Management:  Limit use of hydrocodone as confusion reported.  4. Mood: LCSW to follow for evaluation and support.  5. Neuropsych: This patient is not capable of making decisions on her own behalf. 6. Skin/Wound Care: Monitor wound for healing.  7. Fluids/Electrolytes/Nutrition: Monitor I/O. Renal diet with 1200 FR.  8. ESRD: HD MWF at end of the day to help with tolerance of therapy 9. PAD/Right femoral to popliteal bypass: Wet to dry dressing to right groin wound bid.  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   10 T2DM: Monitor BS ac/hs. Continue to use SSI for elevated BS.   Levemir 10 units at bedtime on hold currently due to variable po intake.    Added nephro with meals.    Levemir 5 units at bedtime started on 2/11 (10 units PTA)   Labile on 2/13 11. HTN: Monitor BP bid   Hydralazine increased to 50 on 2/10   Continue catapres   Labile on 2/13. 12. Neuropathy RLE: On gabapentin.  13. Acute on chronic anemia: On Nulecit IV with HD for iron loading and aranesp weekly.    Hb 10.0 on 2/11   Cont to monitor   Labs with HD 14. COPD: Encourage IS with flutter valve. Continue Dulera.  15. Seizure prophylaxis: On keppra tid. 16. Leukocytosis:    WBCs 12.7 on  2/11   Cont to monitor  17. Black stools   Hemoccult negative   LOS (Days) 9 A FACE TO FACE EVALUATION WAS PERFORMED  Ankit Lorie Phenix 09/08/2017 9:05 AM

## 2017-09-08 NOTE — Progress Notes (Signed)
Physical Therapy Weekly Progress Note  Patient Details  Name: Destiny Day MRN: [id number] Date of Birth: Jan 06, 1951  Beginning of progress report period: August 31, 2017 End of progress report period: September 08, 2017   Patient has met 4 of 4 short term goals.  Pt has made steady progress towards goals and is now min/mod A for transfers, gait and stairs.  Patient continues to demonstrate the following deficits muscle weakness, decreased motor planning, decreased initiation, decreased attention, decreased awareness, decreased safety awareness, decreased memory and delayed processing and decreased standing balance, decreased postural control and decreased balance strategies and therefore will continue to benefit from skilled PT intervention to increase functional independence with mobility.  Patient progressing toward long term goals..  Plan of care revisions: 15/7 therapies.  PT Short Term Goals Week 1:  PT Short Term Goal 1 (Week 1): Patient to perform supine to sit with min A with rails PT Short Term Goal 1 - Progress (Week 1): Met PT Short Term Goal 2 (Week 1): Patient to transfer bed<>w/c mod A PT Short Term Goal 2 - Progress (Week 1): Met PT Short Term Goal 3 (Week 1): Patient to perform sit to stand mod A PT Short Term Goal 3 - Progress (Week 1): Met PT Short Term Goal 4 (Week 1): Patient to ambulate 58' with LRAD and mod A PT Short Term Goal 4 - Progress (Week 1): Met Week 2:  PT Short Term Goal 1 (Week 2): =LTG  Skilled Therapeutic Interventions/Progress Updates:  Ambulation/gait training;DME/adaptive equipment instruction;Neuromuscular re-education;Stair training;UE/LE Strength taining/ROM;Wheelchair propulsion/positioning;Therapeutic Activities;Balance/vestibular training;Functional mobility training;Patient/family education;Therapeutic Exercise;Discharge planning;Cognitive remediation/compensation;Splinting/orthotics;UE/LE Coordination activities;Pain  management;Community reintegration   See Function Navigator for Current Functional Status.   [name] 09/08/2017, 2:55 PM

## 2017-09-08 NOTE — Progress Notes (Signed)
Recreational Therapy Session Note  Patient Details  Name: Destiny Day MRN: [id number] Date of Birth: 07-23-1951 Today's Date: 09/08/2017  Familiar with pt from previous CIR admission.  Met with pt and husband to review TR services.  Much of the session spent discussing current functional status and progress.  Pt expresses increased awareness of her deficits since her surgery/fall and acknowledges she has a long recovery ahead.  Pt requesting to speak with Dr Christella Noa- informed Pam, PA about this request and she is to follow up with pt.  D iscussed pt coping strategies during stressful times.  Pt has supplies in room from home to assist with this.  No further TR at this time.  Will continue to monitor through team. Lenny Bouchillon 09/08/2017, 3:37 PM

## 2017-09-09 ENCOUNTER — Inpatient Hospital Stay (HOSPITAL_COMMUNITY): Payer: Medicare Other

## 2017-09-09 ENCOUNTER — Inpatient Hospital Stay (HOSPITAL_COMMUNITY): Payer: Medicare Other | Admitting: Speech Pathology

## 2017-09-09 ENCOUNTER — Inpatient Hospital Stay (HOSPITAL_COMMUNITY): Payer: Medicare Other | Admitting: Occupational Therapy

## 2017-09-09 LAB — GLUCOSE, CAPILLARY
Glucose-Capillary: 167 mg/dL — ABNORMAL HIGH (ref 65–99)
Glucose-Capillary: 159 mg/dL — ABNORMAL HIGH (ref 65–99)
Glucose-Capillary: 168 mg/dL — ABNORMAL HIGH (ref 65–99)

## 2017-09-09 NOTE — Progress Notes (Signed)
[facility] ROUNDING NOTE   Subjective:    End stage renal disease continues MWF dialysis  She has no complaints and is on rehab recovering from R fem pop when she developed  a Sub dural hematoma after falling in CIR 1/28  and craniotomy was perfprmed emergently .   R Fem pop site is healing well  No complaints and no new issues this am    Objective:  Vital signs in last 24 hours:  Temp:  [97.9 F (36.6 C)-98.1 F (36.7 C)] 98.1 F (36.7 C) (02/14 0345) Pulse Rate:  [82-91] 85 (02/14 0918) Resp:  [16-18] 18 (02/14 0918) BP: (139-162)/(49-91) 154/91 (02/14 0345) SpO2:  [99 %-100 %] 99 % (02/14 0918) Weight:  [138 lb 10.7 oz (62.9 kg)-149 lb 7.6 oz (67.8 kg)] 149 lb 7.6 oz (67.8 kg) (02/14 0345)  Weight change: 0 lb (0 kg) Filed Weights   09/08/17 1400 09/08/17 1845 09/09/17 0345  Weight: 146 lb 6.2 oz (66.4 kg) 138 lb 10.7 oz (62.9 kg) 149 lb 7.6 oz (67.8 kg)    Intake/Output: I/O last 3 completed shifts: In: 120 [P.O.:120] Out: 2900 [Other:2900]   Intake/Output this shift:  Total I/O In: 120 [P.O.:120] Out: -   CVS- RRR RS- CTA ABD- BS present soft non-distended EXT- no edema   Basic Metabolic Panel: Recent Labs  Lab 09/03/17 1437 09/06/17 1315 09/08/17 1553  NA 132* 131* 131*  K 3.9 3.6 4.0  CL 91* 92* 93*  CO2 26 25 26   GLUCOSE 208* 172* 137*  BUN 39* 49* 39*  CREATININE 4.81* 5.97* 5.10*  CALCIUM 8.8* 9.1 9.1  PHOS 3.7 1.7* 1.4*    Liver Function Tests: Recent Labs  Lab 09/03/17 1437 09/06/17 1315 09/08/17 1553  ALBUMIN 2.3* 2.5* 2.5*   No results for input(s): LIPASE, AMYLASE in the last 168 hours. No results for input(s): AMMONIA in the last 168 hours.  CBC: Recent Labs  Lab 09/03/17 1437 09/06/17 1315 09/08/17 1553  WBC 10.3 12.7* 11.7*  HGB 9.3* 10.0* 10.0*  HCT 30.6* 31.7* 32.9*  MCV 100.7* 101.3* 102.8*  PLT 253 253 267    Cardiac Enzymes: No results for input(s): CKTOTAL, CKMB, CKMBINDEX, TROPONINI in the  last 168 hours.  BNP: Invalid input(s): POCBNP  CBG: Recent Labs  Lab 09/07/17 2050 09/08/17 0614 09/08/17 1145 09/08/17 1920 09/09/17 0653  GLUCAP 144* 114* 199* 117* 167*    Microbiology: Results for orders placed or performed during the hospital encounter of 08/23/17  Culture, Urine     Status: None   Collection Time: 08/24/17 10:51 AM  Result Value Ref Range Status   Specimen Description URINE, CATHETERIZED  Final   Special Requests NONE  Final   Culture NO GROWTH  Final   Report Status 08/25/2017 FINAL  Final  MRSA PCR Screening     Status: None   Collection Time: 08/27/17  2:49 AM  Result Value Ref Range Status   MRSA by PCR NEGATIVE NEGATIVE Final    Comment:        The GeneXpert MRSA Assay (FDA approved for NASAL specimens only), is one component of a comprehensive MRSA colonization surveillance program. It is not intended to diagnose MRSA infection nor to guide or monitor treatment for MRSA infections.     Coagulation Studies: No results for input(s): LABPROT, INR in the last 72 hours.  Urinalysis: No results for input(s): COLORURINE, LABSPEC, PHURINE, GLUCOSEU, HGBUR, BILIRUBINUR, KETONESUR, PROTEINUR, UROBILINOGEN, NITRITE, LEUKOCYTESUR in the last 72 hours.  Invalid input(s): APPERANCEUR    Imaging: No results found.   Medications:    . cloNIDine  0.2 mg Oral BID  . darbepoetin (ARANESP) injection - DIALYSIS  150 mcg Intravenous Q Fri-HD  . feeding supplement (PRO-STAT SUGAR FREE 64)  30 mL Oral BID  . gabapentin  100 mg Oral QHS  . hydrALAZINE  50 mg Oral 2 times per day on Sun Tue Thu Sat  . hydrocerin   Topical BID  . insulin aspart  0-5 Units Subcutaneous QHS  . insulin aspart  0-9 Units Subcutaneous TID WC  . insulin detemir  5 Units Subcutaneous QHS  . mouth rinse  15 mL Mouth Rinse BID  . mometasone-formoterol  2 puff Inhalation BID  . pantoprazole  40 mg Oral Daily  . polyethylene glycol  17 g Oral Daily  . sucroferric  oxyhydroxide  1,000 mg Oral TID WC   acetaminophen, albuterol, aluminum hydroxide, bisacodyl, cloNIDine, diphenhydrAMINE, fluticasone, guaiFENesin-dextromethorphan, HYDROcodone-acetaminophen, naLOXone (NARCAN)  injection, polyethylene glycol, prochlorperazine **OR** prochlorperazine **OR** prochlorperazine, simethicone, sodium phosphate, traZODone  Assessment/ Plan:  Dialysis: MWF [facility] 4h  66.5kg  3K/2.25 bath  LUA AVG  P4  No heparin (holding x 8 wks, can resume heparin 10/21/17) Venofer 50mg  IV qwk - last 1/11 Mircera 38mcg IV q2wks - last 1/09       Impression: 1  PAD sp R fem-pop 2  SDH sp craniotomy, rehab 3  ESRD HD mwf 4  Anemia ckd, darbe 150 q Fri, fe load in prog 125mg  x 3 5  HTN stable 6  Vol at dry wt 7  DM bs's good, minimal use of SSI 8  CKD MBD, resumed velphoro   Continues to do well in rehab  Will plan on dialysis tomorrow   LOS: 10 E[place] W @TODAY @11 :19 AM

## 2017-09-09 NOTE — Progress Notes (Signed)
Occupational Therapy Session Note  Patient Details  Name: Destiny Day MRN: [id number] Date of Birth: 1950-10-24  Today's Date: 09/09/2017 OT Individual Time: 1761-6073 OT Individual Time Calculation (min): 45 min    Short Term Goals: Week 1:  OT Short Term Goal 1 (Week 1): Pt will be able to sit to stand with min A of 1 person from w/c for LB self care. OT Short Term Goal 1 - Progress (Week 1): Met OT Short Term Goal 2 (Week 1): Pt will be able to don pants over feet with touching A. OT Short Term Goal 2 - Progress (Week 1): Met OT Short Term Goal 3 (Week 1): Pt will be able to transfer to [facility] or toilet with min A of 1. OT Short Term Goal 3 - Progress (Week 1): Met OT Short Term Goal 4 (Week 1): Pt will actively use her R hand with grooming activities with touching A. OT Short Term Goal 4 - Progress (Week 1): Met Week 2:  OT Short Term Goal 1 (Week 2): STG = LTGs due to remaining LOS  Skilled Therapeutic Interventions/Progress Updates:    Pt received in w/c and declined bathing as she was already dressed for the day.  Pt stated that she feels confident that she can do her B/D without much help. Reviewed the need for close S when she does those tasks and to wait to do them when a staff member can be with her.  Pt states she understands.  Pt taken to day room and worked LE AROM to focus on knee ext/ quad strength and ankle mobility exercises.  She then worked on sit to stand to hi-low table from w/c with close S.  Pt worked on standing for 8 min with S at the table while engaging in a horizontal puzzle. Pt needed mod cues to organize pieces and find the correct placement.   Pt then engaged in a Toppers card activity from sitting position. Pt able to write and decorate the card without cues.  Pt's rec therapist took her back to the room.    Therapy Documentation Precautions:  Precautions Precautions: Fall Required Braces or Orthoses: Other Brace/Splint Other Brace/Splint: Per  rehab notes, pt had Darco shoe for Rt LE -pt stated she did not feel safe in Darco Restrictions Weight Bearing Restrictions: Yes RLE Weight Bearing: Weight bearing as tolerated Therapy Vitals Pulse Rate: 85 Resp: 18 Patient Position (if appropriate): Sitting Oxygen Therapy SpO2: 99 % O2 Device: Not Delivered Pain: Pain Assessment Pain Assessment: No/denies pain Pain Score: 0-No pain ADL: ADL ADL Comments: refer to functional navigator  See Function Navigator for Current Functional Status.   Therapy/Group: Individual Therapy  [place] 09/09/2017, 12:45 PM

## 2017-09-09 NOTE — Progress Notes (Signed)
    Debrided skin edges dead tissue today at bedside.  Patient tolerated well.    Roxy Horseman PA-C

## 2017-09-09 NOTE — Progress Notes (Signed)
Speech Language Pathology Daily Session Note  Patient Details  Name: Destiny Day MRN: [id number] Date of Birth: 1951-01-11  Today's Date: 09/09/2017 SLP Individual Time: 0800-0900 SLP Individual Time Calculation (min): 60 min  Short Term Goals: Week 2: SLP Short Term Goal 1 (Week 2): Pt will consume regular textures with mod I use of swallowing precautions and no overt s/s of aspiration  SLP Short Term Goal 2 (Week 2): Pt will demonstrate functional problem solving for semi-complex tasks with supervision verbal cues.  SLP Short Term Goal 3 (Week 2): Pt will recall novel and functional information with supervision verbal cues for use of external aids.  SLP Short Term Goal 4 (Week 2): Pt will anticipate potential barriers in the home environment and generate safe solutions with supervision cues.    Skilled Therapeutic Interventions:   Pt was seen for skilled ST targeting cognitive goals.  SLP facilitated the session with a semi-complex scheduling/deductive reasoning puzzle.  Pt completed task with min-mod assist for planning, attention to detail, and task organization.  SLP also facilitated the session with extensive conversation regarding discharge planning to target anticipatory awareness.  Pt was able to generate safe solutions to 2 potential barriers at discharge with mod I.   Pt reports she feels her cognition is "fine," but would benefit from some additional ST while inpatient in order to complete education prior to discharge next week.  Pt was returned to room and left in wheelchair with quick release belt donned and call bell within reach.  Continue per current plan of care.    Function:  Eating Eating                 Cognition Comprehension Comprehension assist level: Follows complex conversation/direction with extra time/assistive device  Expression   Expression assist level: Expresses complex 90% of the time/cues < 10% of the time  Social Interaction Social Interaction  assist level: Interacts appropriately with others with medication or extra time (anti-anxiety, antidepressant).  Problem Solving Problem solving assist level: Solves basic 90% of the time/requires cueing < 10% of the time  Memory Memory assist level: Recognizes or recalls 90% of the time/requires cueing < 10% of the time    Pain Pain Assessment Pain Assessment: No/denies pain Pain Score: 0-No pain  Therapy/Group: Individual Therapy  Twinkle Sockwell, Selinda Orion 09/09/2017, 12:46 PM

## 2017-09-09 NOTE — Progress Notes (Addendum)
Physical Therapy Note  Patient Details  Name: Destiny Day MRN: [id number] Date of Birth: 06/04/1951 Today's Date: 09/09/2017  1400-1500, 60 min individual tx Pain:none per pt  Pt seated in w/c, stating her R BG site felt tight.  RLE noted to be moderately edematous.  Pt wearing diabetic socks but not TEDs. PT educated pt about TEDS vs diabetic socks.  [name], SPT donned bil TEDs.   W/c propulsion using bil UEs x 70' with min assist for steering due to veering L.  neuromuscular re-education via forced use, positioning for alternating reciprocal movement bil LEs, x 20 cycles x 2, seated in w/c at [facility] at 50 cm/sec.   Gait training iwht RW on level tile with min assist fading to close supervision, x 174'.  Up/down (2) 7" high steps, L rail, as per home entry, with mod assist and heavy reliance of bil UEs on railing.  Pt left resting in w/c with quick release belt applied and all needs within reach.  See function navigator for current status.   [name] 09/09/2017, 12:43 PM

## 2017-09-09 NOTE — Progress Notes (Signed)
PHYSICAL MEDICINE & REHABILITATION     PROGRESS NOTE  Subjective/Complaints:  Pt seen lying in bed this AM.  She slept well overnight.  She is in a good mood.    ROS: Denies CP, SOB, N/V/D.  Objective: Vital Signs: Blood pressure (!) 154/91, pulse 87, temperature 98.1 F (36.7 C), temperature source Oral, resp. rate 16, weight 67.8 kg (149 lb 7.6 oz), SpO2 100 %. No results found. Recent Labs    09/06/17 1315 09/08/17 1553  WBC 12.7* 11.7*  HGB 10.0* 10.0*  HCT 31.7* 32.9*  PLT 253 267   Recent Labs    09/06/17 1315 09/08/17 1553  NA 131* 131*  K 3.6 4.0  CL 92* 93*  GLUCOSE 172* 137*  BUN 49* 39*  CREATININE 5.97* 5.10*  CALCIUM 9.1 9.1   CBG (last 3)  Recent Labs    09/08/17 1145 09/08/17 1920 09/09/17 0653  GLUCAP 199* 117* 167*    Wt Readings from Last 3 Encounters:  09/09/17 67.8 kg (149 lb 7.6 oz)  08/30/17 69 kg (152 lb 1.9 oz)  08/23/17 69.2 kg (152 lb 8.9 oz)    Physical Exam:  BP (!) 154/91 (BP Location: Left Leg)   Pulse 87   Temp 98.1 F (36.7 C) (Oral)   Resp 16   Wt 67.8 kg (149 lb 7.6 oz)   SpO2 100%   BMI 24.87 kg/m  Constitutional: She appears well-developed and well-nourished. No distress.  HENT: Staples c/d/i Eyes: EOM are normal. No discharge.  Cardiovascular: RRR. No JVD. Respiratory: Effort normal and breath sounds normal.  GI: Bowel sounds are normal. She exhibits no distension.  Musculoskeletal:  Right great toe amputation  Left transmetatarsal   Neurological: She is alert.  Speech clear.  Able to follow simple motor commands without difficulty. A&O3.  Motor: RUE: 4/5 proximal to distal RLE: 3+-4-/5 HF, KE, ADF/PF  Skin: Skin is warm and dry. She is not diaphoretic.  Right great toe amputation site dusky  Left transmetatarsal site well healed.  Right groin with good granulation tissue Psychiatric: Normal mood, normal behavior.  Assessment/Plan: 1. 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 Physiatrist is providing close team supervision and 24 hour management of active medical problems listed below. Physiatrist and rehab team continue to assess barriers to discharge/monitor patient progress toward functional and medical goals.  Function:  Bathing Bathing position   Position: Wheelchair/chair at sink  Bathing parts Body parts bathed by patient: Right arm, Left arm, Chest, Abdomen, Right upper leg, Left upper leg Body parts bathed by helper: Buttocks  Bathing assist Assist Level: Touching or steadying assistance(Pt > 75%)      Upper Body Dressing/Undressing Upper body dressing   What is the patient wearing?: Pull over shirt/dress     Pull over shirt/dress - Perfomed by patient: Thread/unthread right sleeve, Thread/unthread left sleeve, Pull shirt over trunk, Put head through opening Pull over shirt/dress - Perfomed by helper: Put head through opening        Upper body assist Assist Level: Touching or steadying assistance(Pt > 75%)      Lower Body Dressing/Undressing Lower body dressing   What is the patient wearing?: Pants, Socks, Shoes Underwear - Performed by patient: Thread/unthread left underwear leg Underwear - Performed by helper: Thread/unthread right underwear leg, Pull underwear up/down Pants- Performed by patient: Thread/unthread right pants leg, Thread/unthread left pants leg  Pants- Performed by helper: Pull pants up/down Non-skid slipper socks- Performed by patient: Don/doff right sock, Don/doff left sock Non-skid slipper socks- Performed by helper: Don/doff right sock Socks - Performed by patient: Don/doff left sock Socks - Performed by helper: Don/doff right sock Shoes - Performed by patient: Don/doff left shoe Shoes - Performed by helper: Don/doff right shoe, Fasten right, Fasten left     TED Hose - Performed by patient: Don/doff left  TED hose TED Hose - Performed by helper: Don/doff left TED hose, Don/doff right TED hose(due to time)  Lower body assist Assist for lower body dressing: Touching or steadying assistance (Pt > 75%)(Mod assist)      Toileting Toileting Toileting activity did not occur: No continent bowel/bladder event Toileting steps completed by patient: Adjust clothing prior to toileting Toileting steps completed by helper: Adjust clothing after toileting, Performs perineal hygiene Toileting Assistive Devices: Grab bar or rail  Toileting assist     Transfers Chair/bed transfer   Chair/bed transfer method: Stand pivot Chair/bed transfer assist level: Touching or steadying assistance (Pt > 75%) Chair/bed transfer assistive device: Walker, Air cabin crew Ambulation activity did not occur: Safety/medical concerns   Max distance: 60 Assist level: Touching or steadying assistance (Pt > 75%)   Wheelchair   Type: Manual Max wheelchair distance: 150 Assist Level: Touching or steadying assistance (Pt > 75%)  Cognition Comprehension Comprehension assist level: Follows complex conversation/direction with extra time/assistive device  Expression Expression assist level: Expresses complex 90% of the time/cues < 10% of the time  Social Interaction Social Interaction assist level: Interacts appropriately with others with medication or extra time (anti-anxiety, antidepressant).  Problem Solving Problem solving assist level: Solves basic 90% of the time/requires cueing < 10% of the time  Memory Memory assist level: Recognizes or recalls 75 - 89% of the time/requires cueing 10 - 24% of the time    Medical Problem List and Plan: 1.  Generalized weakness, decreased balance as well as issues with delayed processing secondary to TBI with left SDH with evacuation on 1/30.   Cont CIR   Repeat head CT reviewed, slightly improved.  No changes in management per Neurosurg, repeat CT again on 2/10  reviewed, showing stable/improving bleed. 2.  DVT Prophylaxis/Anticoagulation: Mechanical: Sequential compression devices, below knee Bilateral lower extremities 3. Pain Management: Limit use of hydrocodone as confusion reported.  4. Mood: LCSW to follow for evaluation and support.  5. Neuropsych: This patient is not capable of making decisions on her own behalf. 6. Skin/Wound Care: Monitor wound for healing.  7. Fluids/Electrolytes/Nutrition: Monitor I/O. Renal diet with 1200 FR.  8. ESRD: HD MWF at end of the day to help with tolerance of therapy 9. PAD/Right femoral to popliteal bypass: Wet to dry dressing to right groin wound bid.  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   10 T2DM: Monitor BS ac/hs. Continue to use SSI for elevated BS.   Levemir 10 units at bedtime on hold currently due to variable po intake.    Added nephro with meals.    Levemir 5 units at bedtime started on 2/11 (10 units PTA)   Relatively controlled on 2/14 11. HTN: Monitor BP bid   Hydralazine increased to 50 on 2/10   Labile with HD 2/14   Continue catapres  12. Neuropathy RLE: On gabapentin.  13. Acute on chronic anemia: On Nulecit IV with HD for iron loading and aranesp weekly.    Hb 10.0 on 2/13  Cont to monitor   Labs with HD 14. COPD: Encourage IS with flutter valve. Continue Dulera.  15. Seizure prophylaxis: On keppra tid. 16. Leukocytosis:    WBCs 11.7 on 2/13   Cont to monitor  17. Black stools: Resolved   Hemoccult negative   LOS (Days) 10 A FACE TO FACE EVALUATION WAS PERFORMED  Dominyk Law Lorie Phenix 09/09/2017 6:57 AM

## 2017-09-09 NOTE — Patient Care Conference (Signed)
Inpatient RehabilitationTeam Conference and Plan of Care Update Date: 09/08/2017   Time: 2:50 PM    Patient Name: Destiny Day      Medical Record Number: [id number]  Date of Birth: November 18, 1950 Sex: Female         Room/Bed: HD09C/HD09C Payor Info: Payor: MEDICARE / Plan: MEDICARE PART A AND B / Product Type: *No Product type* /    Admitting Diagnosis: SDH  Admit Date/Time:  08/30/2017  6:06 PM Admission Comments: No comment available   Primary Diagnosis:  <principal problem not specified> Principal Problem: <principal problem not specified>  Patient Active Problem List   Diagnosis Date Noted  . Labile blood glucose   . Anemia of infection and chronic disease   . Essential hypertension   . Black stool   . Right sided weakness   . Subdural hemorrhage following injury ([place]) 08/30/2017  . Diabetic peripheral neuropathy ([place])   . Chronic obstructive pulmonary disease ([place])   . Seizure prophylaxis   . Subdural hematoma ([place]) 08/24/2017  . Traumatic subdural hematoma ([place]) 08/23/2017  . Fall   . SDH (subdural hematoma) ([place])   . Acute respiratory failure with hypoxia ([place])   . Diabetes mellitus type 2 in nonobese (HCC)   . Leukocytosis   . Labile blood pressure   . Generalized anxiety disorder   . Debility 08/16/2017  . Amputated great toe of right foot ([place])   . Amputated toe of left foot ([place])   . Post-operative pain   . ESRD on dialysis ([place])   . Diabetes mellitus ([place])   . Anxiety state   . Benign essential HTN   . Acute blood loss anemia   . Anemia of chronic disease   . Idiopathic chronic venous hypertension of both lower extremities with inflammation 10/13/2016  . S/P transmetatarsal amputation of foot, left ([place]) 09/21/2016  . Onychomycosis 08/15/2016  . Gangrene of toe of left foot ([place])   . PAD (peripheral artery disease) ([place]) 07/08/2016  . Atherosclerosis of native artery of left lower extremity with gangrene ([place]) 06/23/2016  . GERD (gastroesophageal reflux  disease) 10/07/2015  . ARF (acute renal failure) ([place])   . Hypothermia 06/12/2015  . Acute on chronic renal failure ([place]) 06/12/2015  . CHF (congestive heart failure) ([place]) 07/30/2014  . CKD (chronic kidney disease) stage 4, GFR 15-29 ml/min (HCC) 06/27/2014  . Hypertensive heart disease 05/25/2013  . CKD (chronic kidney disease) stage 3, GFR 30-59 ml/min (HCC) 05/25/2013  . Pulmonary edema 05/23/2013  . Type 2 diabetes mellitus with diabetic nephropathy ([place]) 05/23/2013  . Type 2 diabetes mellitus with hyperosmolar nonketotic hyperglycemia ([place]) 04/13/2013  . Chronic diastolic CHF (congestive heart failure) ([place]) 04/13/2013  . UGI bleed 03/31/2013  . Hypokalemia 08/24/2012  . Type II or unspecified type diabetes mellitus without mention of complication, not stated as uncontrolled 12/27/2007  . HLD (hyperlipidemia) 12/27/2007  . ALLERGIC RHINITIS 12/27/2007  . Asthma 12/27/2007    Expected Discharge Date: Expected Discharge Date: 09/16/17  Team Members Present: Physician leading conference: Dr. Delice Lesch Social Worker Present: Lennart Pall, LCSW Nurse Present: Benjie Karvonen, RN PT Present: Roderic Ovens, PT OT Present: Simonne Come, OT SLP Present: Windell Moulding, SLP PPS Coordinator present : Daiva Nakayama, RN, CRRN     Current Status/Progress Goal Weekly Team Focus  Medical   Generalized weakness, decreased balance as well as issues with delayed processing secondary to TBI with left SDH with evacuation on 1/30.  Improve mobility, DM/BP, safety  See above  Bowel/Bladder   incont bowel; oliguric; hemodialysis; lbm 2/12  mod assist; HD treatment   monitor BM's for frequency of Q 2-3 days; (miralax; dulcolax)   Swallow/Nutrition/ Hydration             ADL's   min assist transfers, min assist bathing and dressing  supervision overall  ADL retraining, RUE NMR, activity tolerance, standing balance, pt/family education   Mobility   min transfers, gait with min assist x 170',  up/down 2 steps with mod assist  min A/supervision  activity tolerance, balance, pt ed, mobiltity and locomotion   Communication             Safety/Cognition/ Behavioral Observations  min-supervision assist, subjectively clearer in comparison to last week   mod I   recall of new information, higher level problem solving, safety awareness   Pain   c/o headache; pain to R groin site during dressing change; tylenol   <3  assess for pain q shift and admin meds per order   Skin   staples to L side of head; R calf incision healing; R groin site open, painful, green/teal drainage; L foot amputated toes; R foot apmutated great toe  free from further breakdown/infection  assess skin q shift; dressing changes per order; santyl, wet to dry    Rehab Goals Patient on target to meet rehab goals: Yes *See Care Plan and progress notes for long and short-term goals.     Barriers to Discharge  Current Status/Progress Possible Resolutions Date Resolved   Physician    Medical stability;Hemodialysis;Wound Care     See above  Therapies, optimize BP/DM meds, follow labs, repeat head CT      Nursing                  PT                    OT                  SLP                SW                Discharge Planning/Teaching Needs:  Husband taking FMLA to provide 24/7 assistance for pt at d/c.  Will need teaching with spouse prior to d/c.   Team Discussion:  RN reports some concern with groin wound and if vascular has seen.  Min-mod assist with mobility and min assist goals.  Light min with ADLs and still supervision goals.  Poor memory with new info.  Able to upgrade diet to D3, thin.  Ready to begin education with spouse.  Revisions to Treatment Plan:  None    Continued Need for Acute Rehabilitation Level of Care: The patient requires daily medical management by a physician with specialized training in physical medicine and rehabilitation for the following conditions: Daily direction of a  multidisciplinary physical rehabilitation program to ensure safe treatment while eliciting the highest outcome that is of practical value to the patient.: Yes Daily medical management of patient stability for increased activity during participation in an intensive rehabilitation regime.: Yes Daily analysis of laboratory values and/or radiology reports with any subsequent need for medication adjustment of medical intervention for : Post surgical problems;Renal problems;Wound care problems;Diabetes problems;Blood pressure problems;Neurological problems  Roosevelt Eimers 09/10/2017, 2:45 PM

## 2017-09-09 NOTE — Progress Notes (Signed)
Patient ID: Destiny Day, female   DOB: [birth year], 67 y.o.   MRN: [id number] Comfortable this morning.  Right great toe amputation site completely healed.  Popliteal incision completely healed.  Right groin wound with excellent granulation tissue at the base.  There is some greenish tinged drainage on the gauze.  Some fibrinous exudate and necrotic debris on the periphery of the wound.  Will debride this today...  Change to Dakin's solution from [place].

## 2017-09-10 ENCOUNTER — Inpatient Hospital Stay (HOSPITAL_COMMUNITY): Payer: Medicare Other | Admitting: Speech Pathology

## 2017-09-10 ENCOUNTER — Inpatient Hospital Stay (HOSPITAL_COMMUNITY): Payer: Medicare Other | Admitting: Occupational Therapy

## 2017-09-10 ENCOUNTER — Inpatient Hospital Stay (HOSPITAL_COMMUNITY): Payer: Medicare Other | Admitting: Physical Therapy

## 2017-09-10 LAB — RENAL FUNCTION PANEL
Albumin: 2.5 g/dL — ABNORMAL LOW (ref 3.5–5.0)
Anion gap: 12 (ref 5–15)
BUN: 46 mg/dL — ABNORMAL HIGH (ref 6–20)
CO2: 27 mmol/L (ref 22–32)
Calcium: 9 mg/dL (ref 8.9–10.3)
Chloride: 92 mmol/L — ABNORMAL LOW (ref 101–111)
Creatinine, Ser: 5.24 mg/dL — ABNORMAL HIGH (ref 0.44–1.00)
GFR calc Af Amer: 9 mL/min — ABNORMAL LOW (ref >60)
GFR calc non Af Amer: 8 mL/min — ABNORMAL LOW (ref >60)
Glucose, Bld: 162 mg/dL — ABNORMAL HIGH (ref 65–99)
Phosphorus: 1.5 mg/dL — ABNORMAL LOW (ref 2.5–4.6)
Potassium: 3.6 mmol/L (ref 3.5–5.1)
Sodium: 131 mmol/L — ABNORMAL LOW (ref 135–145)

## 2017-09-10 LAB — CBC
HCT: 29.5 % — ABNORMAL LOW (ref 36.0–46.0)
Hemoglobin: 9.2 g/dL — ABNORMAL LOW (ref 12.0–15.0)
MCH: 31.9 pg (ref 26.0–34.0)
MCHC: 31.2 g/dL (ref 30.0–36.0)
MCV: 102.4 fL — ABNORMAL HIGH (ref 78.0–100.0)
Platelets: 230 10*3/uL (ref 150–400)
RBC: 2.88 MIL/uL — ABNORMAL LOW (ref 3.87–5.11)
RDW: 17 % — ABNORMAL HIGH (ref 11.5–15.5)
WBC: 10.3 10*3/uL (ref 4.0–10.5)

## 2017-09-10 LAB — GLUCOSE, CAPILLARY
Glucose-Capillary: 167 mg/dL — ABNORMAL HIGH (ref 65–99)
Glucose-Capillary: 155 mg/dL — ABNORMAL HIGH (ref 65–99)
Glucose-Capillary: 148 mg/dL — ABNORMAL HIGH (ref 65–99)
Glucose-Capillary: 133 mg/dL — ABNORMAL HIGH (ref 65–99)
Glucose-Capillary: 121 mg/dL — ABNORMAL HIGH (ref 65–99)

## 2017-09-10 MED ORDER — HYDROCODONE-ACETAMINOPHEN 5-325 MG PO TABS
ORAL_TABLET | ORAL | Status: AC
  Filled 2017-09-10: qty 1

## 2017-09-10 MED ORDER — ALTEPLASE 2 MG IJ SOLR: 2.0000 mg | Freq: Once | INTRAMUSCULAR | Status: DC | PRN

## 2017-09-10 MED ORDER — SODIUM CHLORIDE 0.9 % IV SOLN: 100.0000 mL | INTRAVENOUS | Status: DC | PRN

## 2017-09-10 MED ORDER — HEPARIN SODIUM (PORCINE) 1000 UNIT/ML DIALYSIS: 1000.0000 [IU] | INTRAMUSCULAR | Status: DC | PRN

## 2017-09-10 MED ORDER — NON FORMULARY: 5.0000 mL | Freq: Every day | Status: DC

## 2017-09-10 MED ORDER — INSULIN DETEMIR 100 UNIT/ML ~~LOC~~ SOLN
7.0000 [IU] | Freq: Every day | SUBCUTANEOUS | Status: DC
  Administered 2017-09-10 – 2017-09-15 (×6): 7 [IU] via SUBCUTANEOUS
  Filled 2017-09-10 (×6): qty 0.07

## 2017-09-10 MED ORDER — LIDOCAINE-PRILOCAINE 2.5-2.5 % EX CREA: 1.0000 "application " | TOPICAL_CREAM | CUTANEOUS | Status: DC | PRN

## 2017-09-10 MED ORDER — DARBEPOETIN ALFA 150 MCG/0.3ML IJ SOSY
PREFILLED_SYRINGE | INTRAMUSCULAR | Status: AC
  Filled 2017-09-10: qty 0.3

## 2017-09-10 MED ORDER — PENTAFLUOROPROP-TETRAFLUOROETH EX AERO: 1.0000 "application " | INHALATION_SPRAY | CUTANEOUS | Status: DC | PRN

## 2017-09-10 MED ORDER — DAKINS (1/4 STRENGTH) 0.125 % EX SOLN
Freq: Every day | CUTANEOUS | Status: AC
  Administered 2017-09-10 – 2017-09-12 (×3)
  Filled 2017-09-10 (×2): qty 473

## 2017-09-10 MED ORDER — LIDOCAINE HCL (PF) 1 % IJ SOLN: 5.0000 mL | INTRAMUSCULAR | Status: DC | PRN

## 2017-09-10 NOTE — Progress Notes (Signed)
Physical Therapy Note  Patient Details  Name: Destiny Day MRN: [id number] Date of Birth: 10-29-1950 Today's Date: 09/10/2017    Time: [phone] 56 minutes  1:1 No c/o pain.  Gait with RW 150' with supervision in controlled environment.  Gait with obstacle negotiation multiple reps with cues for attention and avoiding obstacles with RW, min guard.  Standing balance without AD with min/mod A for cornhole game.  Nustep x 7 minutes level 4 for UE/LE strengthening.  Pt with improving activity tolerance and endurance with gait.  Hailie Searight 09/10/2017, 10:29 AM

## 2017-09-10 NOTE — Progress Notes (Signed)
Occupational Therapy Session Note  Patient Details  Name: Destiny Day MRN: [id number] Date of Birth: 1951-02-02  Today's Date: 09/10/2017 OT Individual Time: 0802-2336 OT Individual Time Calculation (min): 44 min    Short Term Goals: Week 2:  OT Short Term Goal 1 (Week 2): STG = LTGs due to remaining LOS  Skilled Therapeutic Interventions/Progress Updates:    Treatment session with focus on sequencing and increased safety awareness during self-care tasks.  Pt asleep upon arrival, requiring increased time and effort to fully arouse compared to most mornings.  Pt somewhat confused this session, not recalling this therapist despite primary OT for majority of week. Completed stand pivot transfer bed > w/c > toilet with RW with min guard.  Pt demonstrating improved stability with UE support on RW and when standing for LB bathing.  Completed bathing at sit > stand level at sink with close supervision.  Pt to complete LB dressing with nurse tech due to time constraints.     Therapy Documentation Precautions:  Precautions Precautions: Fall Required Braces or Orthoses: Other Brace/Splint Other Brace/Splint: Per rehab notes, pt had [place] shoe for Rt LE -pt stated she did not feel safe in [place] Restrictions Weight Bearing Restrictions: Yes RLE Weight Bearing: Weight bearing as tolerated General:   Vital Signs: Therapy Vitals Temp: 98.3 F (36.8 C) Temp Source: Oral Pulse Rate: 94 Resp: 18 BP: (!) 110/54 Patient Position (if appropriate): Sitting Oxygen Therapy SpO2: 98 % O2 Device: Not Delivered Pain:  Pt with no c/o pain  See Function Navigator for Current Functional Status.   Therapy/Group: Individual Therapy  Simonne Come 09/10/2017, 9:55 AM

## 2017-09-10 NOTE — Progress Notes (Signed)
Destiny Day PHYSICAL MEDICINE & REHABILITATION     PROGRESS NOTE  Subjective/Complaints:  Pt seen lying in bed this AM.  Destiny Day slept well overnight.  Destiny Day denies complaints this AM.  ROS: Denies CP, SOB, N/V/D.  Objective: Vital Signs: Blood pressure (!) 110/54, pulse 95, temperature 98.3 F (36.8 C), temperature source Oral, resp. rate 18, weight 67.9 kg (149 lb 11.1 oz), SpO2 99 %. No results found. Recent Labs    09/08/17 1553  WBC 11.7*  HGB 10.0*  HCT 32.9*  PLT 267   Recent Labs    09/08/17 1553  NA 131*  K 4.0  CL 93*  GLUCOSE 137*  BUN 39*  CREATININE 5.10*  CALCIUM 9.1   CBG (last 3)  Recent Labs    09/09/17 1702 09/09/17 2204 09/10/17 0655  GLUCAP 168* 167* 155*    Wt Readings from Last 3 Encounters:  09/10/17 67.9 kg (149 lb 11.1 oz)  08/30/17 69 kg (152 lb 1.9 oz)  08/23/17 69.2 kg (152 lb 8.9 oz)    Physical Exam:  BP (!) 110/54 (BP Location: Left Arm)   Pulse 95   Temp 98.3 F (36.8 C) (Oral)   Resp 18   Wt 67.9 kg (149 lb 11.1 oz)   SpO2 99%   BMI 24.91 kg/m  Constitutional: Destiny Day appears well-developed and well-nourished. No distress.  HENT: Staples c/d/i Eyes: EOM are normal. No discharge.  Cardiovascular: RRR. No JVD. Respiratory: Effort normal and breath sounds normal.  GI: Bowel sounds are normal. Destiny Day exhibits no distension.  Musculoskeletal:  Right great toe amputation  Left transmetatarsal   Neurological: Destiny Day is alert.  Speech clear.  Able to follow simple motor commands without difficulty. A&O3.  Motor: RUE: 4/5 proximal to distal RLE: 3+-4-/5 HF, KE, ADF/PF (stable) Skin: Skin is warm and dry. Destiny Day is not diaphoretic.  Right great toe amputation site dusky  Left transmetatarsal site well healed.  Right groin with dressing c/d/i Psychiatric: Normal mood, normal behavior.  Assessment/Plan: 1. 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 Physiatrist is providing close team supervision and 24 hour management of active medical problems listed below. Physiatrist and rehab team continue to assess barriers to discharge/monitor patient progress toward functional and medical goals.  Function:  Bathing Bathing position   Position: Wheelchair/chair at sink  Bathing parts Body parts bathed by patient: Right arm, Left arm, Chest, Abdomen, Right upper leg, Left upper leg Body parts bathed by helper: Buttocks  Bathing assist Assist Level: Touching or steadying assistance(Pt > 75%)      Upper Body Dressing/Undressing Upper body dressing   What is the patient wearing?: Pull over shirt/dress     Pull over shirt/dress - Perfomed by patient: Thread/unthread right sleeve, Thread/unthread left sleeve, Pull shirt over trunk, Put head through opening Pull over shirt/dress - Perfomed by helper: Put head through opening        Upper body assist Assist Level: Touching or steadying assistance(Pt > 75%)      Lower Body Dressing/Undressing Lower body dressing   What is the patient wearing?: Pants, Socks, Shoes Underwear - Performed by patient: Thread/unthread left underwear leg Underwear - Performed by helper: Thread/unthread right underwear leg, Pull underwear up/down Pants- Performed by patient: Thread/unthread right pants leg, Thread/unthread left pants leg Pants- Performed by helper: Pull pants up/down Non-skid slipper socks- Performed by patient: Don/doff right sock, Don/doff left sock  Non-skid slipper socks- Performed by helper: Don/doff right sock Socks - Performed by patient: Don/doff left sock Socks - Performed by helper: Don/doff right sock Shoes - Performed by patient: Don/doff left shoe Shoes - Performed by helper: Don/doff right shoe, Fasten right, Fasten left     TED Hose - Performed by patient: Don/doff left TED hose TED Hose - Performed by helper: Don/doff left TED hose, Don/doff  right TED hose(due to time)  Lower body assist Assist for lower body dressing: Touching or steadying assistance (Pt > 75%)(Mod assist)      Toileting Toileting Toileting activity did not occur: No continent bowel/bladder event Toileting steps completed by patient: Adjust clothing prior to toileting Toileting steps completed by helper: Adjust clothing after toileting, Performs perineal hygiene Toileting Assistive Devices: Grab bar or rail  Toileting assist     Transfers Chair/bed transfer   Chair/bed transfer method: Stand pivot Chair/bed transfer assist level: Touching or steadying assistance (Pt > 75%) Chair/bed transfer assistive device: Walker, Air cabin crew Ambulation activity did not occur: Safety/medical concerns   Max distance: 170 Assist level: Touching or steadying assistance (Pt > 75%)   Wheelchair   Type: Manual Max wheelchair distance: 70 Assist Level: Touching or steadying assistance (Pt > 75%)  Cognition Comprehension Comprehension assist level: Follows complex conversation/direction with extra time/assistive device  Expression Expression assist level: Expresses complex 90% of the time/cues < 10% of the time  Social Interaction Social Interaction assist level: Interacts appropriately with others with medication or extra time (anti-anxiety, antidepressant).  Problem Solving Problem solving assist level: Solves basic 90% of the time/requires cueing < 10% of the time  Memory Memory assist level: Recognizes or recalls 90% of the time/requires cueing < 10% of the time    Medical Problem List and Plan: 1.  Generalized weakness, decreased balance as well as issues with delayed processing secondary to TBI with left SDH with evacuation on 1/30.   Cont CIR   Repeat head CT reviewed, slightly improved.  No changes in management per Neurosurg, repeat CT again on 2/10 reviewed, showing stable/improving bleed. 2.  DVT Prophylaxis/Anticoagulation:  Mechanical: Sequential compression devices, below knee Bilateral lower extremities 3. Pain Management: Limit use of hydrocodone as confusion reported.  4. Mood: LCSW to follow for evaluation and support.  5. Neuropsych: This patient is not capable of making decisions on her own behalf. 6. Skin/Wound Care: Monitor wound for healing.  7. Fluids/Electrolytes/Nutrition: Monitor I/O. Renal diet with 1200 FR.  8. ESRD: HD MWF at end of the day to help with tolerance of therapy 9. PAD/Right femoral to popliteal bypass: Wet to dry dressing to right groin wound bid.  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   10 T2DM: Monitor BS ac/hs. Continue to use SSI for elevated BS.   Levemir 10 units at bedtime on hold currently due to variable po intake.    Added nephro with meals.    Levemir 5 units at bedtime started on 2/11, increased to 7 units on 2/15 (10 units PTA) 11. HTN: Monitor BP bid   Hydralazine increased to 50 on 2/10   Labile with HD 2/15   Continue catapres  12. Neuropathy RLE: On gabapentin.  13. Acute on chronic anemia: On Nulecit IV with HD for iron loading and aranesp weekly.    Hb 10.0 on 2/13   Cont to monitor   Labs with HD 14. COPD: Encourage IS with flutter valve. Continue Dulera.  15. Seizure prophylaxis: On  keppra tid. 16. Leukocytosis:    WBCs 11.7 on 2/13   Labs with HD   Cont to monitor   LOS (Days) 11 A FACE TO FACE EVALUATION WAS PERFORMED  Destiny Day Destiny Day 09/10/2017 9:15 AM

## 2017-09-10 NOTE — Progress Notes (Signed)
[facility] ROUNDING NOTE   Subjective:    End stage renal disease continues MWF dialysis  She has no complaints and is on rehab recovering from R fem pop when she developed  a Sub dural hematoma after falling in CIR 1/28  and craniotomy was perfprmed emergently .   R Fem pop site is healing well  Plan dialysis today   Objective:  Vital signs in last 24 hours:  Temp:  [98.3 F (36.8 C)-98.4 F (36.9 C)] 98.3 F (36.8 C) (02/15 0655) Pulse Rate:  [90-95] 94 (02/15 0921) Resp:  [18-19] 18 (02/15 0921) BP: (110-146)/(54-66) 110/54 (02/15 0655) SpO2:  [98 %-99 %] 98 % (02/15 0921) Weight:  [149 lb 11.1 oz (67.9 kg)] 149 lb 11.1 oz (67.9 kg) (02/15 0655)  Weight change: 3 lb 4.9 oz (1.5 kg) Filed Weights   09/08/17 1845 09/09/17 0345 09/10/17 0655  Weight: 138 lb 10.7 oz (62.9 kg) 149 lb 7.6 oz (67.8 kg) 149 lb 11.1 oz (67.9 kg)    Intake/Output: I/O last 3 completed shifts: In: 440 [P.O.:440] Out: -    Intake/Output this shift:  No intake/output data recorded.  CVS- RRR RS- CTA ABD- BS present soft non-distended EXT- no edema   Basic Metabolic Panel: Recent Labs  Lab 09/03/17 1437 09/06/17 1315 09/08/17 1553  NA 132* 131* 131*  K 3.9 3.6 4.0  CL 91* 92* 93*  CO2 26 25 26   GLUCOSE 208* 172* 137*  BUN 39* 49* 39*  CREATININE 4.81* 5.97* 5.10*  CALCIUM 8.8* 9.1 9.1  PHOS 3.7 1.7* 1.4*    Liver Function Tests: Recent Labs  Lab 09/03/17 1437 09/06/17 1315 09/08/17 1553  ALBUMIN 2.3* 2.5* 2.5*   No results for input(s): LIPASE, AMYLASE in the last 168 hours. No results for input(s): AMMONIA in the last 168 hours.  CBC: Recent Labs  Lab 09/03/17 1437 09/06/17 1315 09/08/17 1553  WBC 10.3 12.7* 11.7*  HGB 9.3* 10.0* 10.0*  HCT 30.6* 31.7* 32.9*  MCV 100.7* 101.3* 102.8*  PLT 253 253 267    Cardiac Enzymes: No results for input(s): CKTOTAL, CKMB, CKMBINDEX, TROPONINI in the last 168 hours.  BNP: Invalid input(s):  POCBNP  CBG: Recent Labs  Lab 09/09/17 1149 09/09/17 1702 09/09/17 2204 09/10/17 0655 09/10/17 1113  GLUCAP 159* 168* 167* 155* 148*    Microbiology: Results for orders placed or performed during the hospital encounter of 08/23/17  Culture, Urine     Status: None   Collection Time: 08/24/17 10:51 AM  Result Value Ref Range Status   Specimen Description URINE, CATHETERIZED  Final   Special Requests NONE  Final   Culture NO GROWTH  Final   Report Status 08/25/2017 FINAL  Final  MRSA PCR Screening     Status: None   Collection Time: 08/27/17  2:49 AM  Result Value Ref Range Status   MRSA by PCR NEGATIVE NEGATIVE Final    Comment:        The GeneXpert MRSA Assay (FDA approved for NASAL specimens only), is one component of a comprehensive MRSA colonization surveillance program. It is not intended to diagnose MRSA infection nor to guide or monitor treatment for MRSA infections.     Coagulation Studies: No results for input(s): LABPROT, INR in the last 72 hours.  Urinalysis: No results for input(s): COLORURINE, LABSPEC, PHURINE, GLUCOSEU, HGBUR, BILIRUBINUR, KETONESUR, PROTEINUR, UROBILINOGEN, NITRITE, LEUKOCYTESUR in the last 72 hours.  Invalid input(s): APPERANCEUR    Imaging: No results found.   Medications:    .  cloNIDine  0.2 mg Oral BID  . darbepoetin (ARANESP) injection - DIALYSIS  150 mcg Intravenous Q Fri-HD  . feeding supplement (PRO-STAT SUGAR FREE 64)  30 mL Oral BID  . gabapentin  100 mg Oral QHS  . hydrALAZINE  50 mg Oral 2 times per day on Sun Tue Thu Sat  . hydrocerin   Topical BID  . insulin aspart  0-5 Units Subcutaneous QHS  . insulin aspart  0-9 Units Subcutaneous TID WC  . insulin detemir  7 Units Subcutaneous QHS  . mouth rinse  15 mL Mouth Rinse BID  . mometasone-formoterol  2 puff Inhalation BID  . pantoprazole  40 mg Oral Daily  . polyethylene glycol  17 g Oral Daily  . sucroferric oxyhydroxide  1,000 mg Oral TID WC    acetaminophen, albuterol, aluminum hydroxide, bisacodyl, cloNIDine, diphenhydrAMINE, fluticasone, guaiFENesin-dextromethorphan, HYDROcodone-acetaminophen, naLOXone (NARCAN)  injection, polyethylene glycol, prochlorperazine **OR** prochlorperazine **OR** prochlorperazine, simethicone, sodium phosphate, traZODone  Assessment/ Plan:  Dialysis: MWF [facility] 4h  66.5kg  3K/2.25 bath  LUA AVG  P4  No heparin (holding x 8 wks, can resume heparin 10/21/17) Venofer 50mg  IV qwk - last 1/11 Mircera 47mcg IV q2wks - last 1/09       Impression: 1  PAD sp R fem-pop 2  SDH sp craniotomy, rehab 3  ESRD HD mwf 4  Anemia ckd, darbe 150 q Fri, fe load in prog 125mg  x 3 5  HTN stable 6  Vol at dry wt 7  DM bs's good, minimal use of SSI 8  CKD MBD, resumed velphoro   Continues to do well in rehab  Will plan on dialysis     LOS: 11 Teffany Blaszczyk W @TODAY @11 :39 AM

## 2017-09-10 NOTE — Progress Notes (Signed)
Speech Language Pathology Daily Session Note  Patient Details  Name: Destiny Day MRN: [id number] Date of Birth: [birth year]  Today's Date: 09/10/2017 SLP Individual Time: 1105-1200 SLP Individual Time Calculation (min): 55 min  Short Term Goals: Week 2: SLP Short Term Goal 1 (Week 2): Pt will consume regular textures with mod I use of swallowing precautions and no overt s/s of aspiration  SLP Short Term Goal 2 (Week 2): Pt will demonstrate functional problem solving for semi-complex tasks with supervision verbal cues.  SLP Short Term Goal 3 (Week 2): Pt will recall novel and functional information with supervision verbal cues for use of external aids.  SLP Short Term Goal 4 (Week 2): Pt will anticipate potential barriers in the home environment and generate safe solutions with supervision cues.    Skilled Therapeutic Interventions:   Pt was seen for skilled ST targeting cognitive goals.  SLP facilitated the session with a novel card game targeting memory compensatory strategies, specifically associations.  Pt completed task with min assist for working memory of self generated word-picture associations following various delays.  She needed less cues for recall of functional information related to her care, such as her therapy schedule and specific therapy activities.  SLP reviewed and reinforced memory compensatory strategies with pt and husband.  All questions were answered to their satisfaction at this time, although pt reports wanting to speak with Drs. Cabbell and Early regarding follow up recommendations post surgeries.  RN aware. Pt was returned to room and left in wheelchair with quick release belt donned, call bell within reach, and husband at bedside.  Continue per current plan of care.     Function:  Eating Eating                 Cognition Comprehension Comprehension assist level: Follows complex conversation/direction with extra time/assistive device  Expression    Expression assist level: Expresses complex 90% of the time/cues < 10% of the time  Social Interaction Social Interaction assist level: Interacts appropriately with others with medication or extra time (anti-anxiety, antidepressant).  Problem Solving Problem solving assist level: Solves basic 90% of the time/requires cueing < 10% of the time  Memory Memory assist level: Recognizes or recalls 75 - 89% of the time/requires cueing 10 - 24% of the time    Pain Pain Assessment Pain Assessment: No/denies pain  Therapy/Group: Individual Therapy  Maclovio Henson, Selinda Orion 09/10/2017, 12:30 PM

## 2017-09-11 ENCOUNTER — Inpatient Hospital Stay (HOSPITAL_COMMUNITY): Payer: Medicare Other | Admitting: Occupational Therapy

## 2017-09-11 ENCOUNTER — Inpatient Hospital Stay (HOSPITAL_COMMUNITY): Payer: Medicare Other

## 2017-09-11 DIAGNOSIS — S065X1S Traumatic subdural hemorrhage with loss of consciousness of 30 minutes or less, sequela: Secondary | ICD-10-CM

## 2017-09-11 LAB — GLUCOSE, CAPILLARY
Glucose-Capillary: 140 mg/dL — ABNORMAL HIGH (ref 65–99)
Glucose-Capillary: 217 mg/dL — ABNORMAL HIGH (ref 65–99)
Glucose-Capillary: 121 mg/dL — ABNORMAL HIGH (ref 65–99)
Glucose-Capillary: 139 mg/dL — ABNORMAL HIGH (ref 65–99)

## 2017-09-11 LAB — OCCULT BLOOD X 1 CARD TO LAB, STOOL: Fecal Occult Bld: NEGATIVE

## 2017-09-11 MED ORDER — CLONIDINE HCL 0.2 MG PO TABS
0.2000 mg | ORAL_TABLET | Freq: Three times a day (TID) | ORAL | Status: DC
  Administered 2017-09-11 – 2017-09-15 (×12): 0.2 mg via ORAL
  Filled 2017-09-11 (×15): qty 1

## 2017-09-11 NOTE — Progress Notes (Signed)
Occupational Therapy Session Note  Patient Details  Name: Destiny Day MRN: [id number] Date of Birth: 04-10-1951  Today's Date: 09/11/2017 OT Individual Time: 1300-1345 OT Individual Time Calculation (min): 45 min    Short Term Goals: Week 2:  OT Short Term Goal 1 (Week 2): STG = LTGs due to remaining LOS  Skilled Therapeutic Interventions/Progress Updates:    Treatment session with focus on dynamic standing balance and balance reactions.  Engaged in [place] activity on balance board with focus on weight shifts anterior/posterior and Rt/Lt.  Pt required tactile cues initially to increase weight shift, followed by min guard throughout balance board activities.  Pt demonstrating good motor control and weight shift to complete moderately paced activities, increased difficulty with quicker paced activities.  Returned to room and left seated upright in w/c with daughter and grandson present.  Therapy Documentation Precautions:  Precautions Precautions: Fall Required Braces or Orthoses: Other Brace/Splint Other Brace/Splint: Per rehab notes, pt had Darco shoe for Rt LE -pt stated she did not feel safe in Darco Restrictions Weight Bearing Restrictions: Yes RLE Weight Bearing: Weight bearing as tolerated  Pain:  Pt with no c/o pain  See Function Navigator for Current Functional Status.   Therapy/Group: Individual Therapy  Simonne Come 09/11/2017, 3:51 PM

## 2017-09-11 NOTE — Progress Notes (Addendum)
Physical Therapy Note  Patient Details  Name: Destiny Day MRN: [id number] Date of Birth: 24-Nov-1950 Today's Date: 09/11/2017  tx 1:  [phone], 45 min individual tx Pain: none per pt  PT donned TEDS with pt in supine.  Bed mobility in flat bed no rail with min assist to elevate trunk from L side lying.  In L side lying: bil hip flex x 10.  Seated 10 x 1 R/L: hip flex, long arc quad knee ext, ankle PF, ankle DF, bil hip abd/adduction against mild manual  resistance x 10 each.  Gait training with RW on level tile through obstacle course requiring side stepping, weaving in/out of cones, x 75' x 2, with seated rest between bouts.   Up/down (2 ) 7" high steps , L rail, step to method, descending backwards, mod assist.  Pt left resting in w/c iwht alarm set and quick release belt donned, all needs at hand.  tx 2:  1400-1500, 60 min individual tx Pain: none per pt  Pt dozing sitting up in w/c, but easily aroused.  W/c propulsion usig bil UEs x 150' with supervision on level tile.    Seated neuromuscular re-education via multimodal cues, demo for kicking beach ball with R/L feet, 10 x 1 each ,cues for timing.  Pt improved with practice, but had more difficulty with coordinating LLE. Holding 2# weighted bar, pt volleyed beach ball back and forth, x 10 x 2.     Up/down (3) 7" high steps with L rail, SPC in R hand, per pt's request.  VCs fro sequencing and min assist.  Pt sat in an armless chair on the landing.  Descended forwards, with SPC in L hand, min assist and VCS. Gait training over level tile with RW, min guard> supervision x 150'.  Pt was able to increase velocity with cues; she stated RLE surgical site soreness has limited her speed until now.    Floor clock in standing with mod assist to tap with R or L foot any of 4 targets in front of her in an arc.  Discussed her need for RW for ambulation for now.  Pt left resting in w/c with quick release belt applied , alarm set and all  needs within reach.  Dtr present in room. See function navigator for current status.  Vane Yapp 09/11/2017, 8:07 AM

## 2017-09-11 NOTE — Progress Notes (Signed)
[facility] PHYSICAL MEDICINE & REHABILITATION     PROGRESS NOTE  Subjective/Complaints:  No new issues.   ROS: pt denies nausea, vomiting, diarrhea, cough, shortness of breath or chest pain   Objective: Vital Signs: Blood pressure (!) 146/119, pulse 85, temperature 98.1 F (36.7 C), temperature source Oral, resp. rate 18, weight 62.3 kg (137 lb 5.6 oz), SpO2 100 %. No results found. Recent Labs    09/08/17 1553 09/10/17 1400  WBC 11.7* 10.3  HGB 10.0* 9.2*  HCT 32.9* 29.5*  PLT 267 230   Recent Labs    09/08/17 1553 09/10/17 1400  NA 131* 131*  K 4.0 3.6  CL 93* 92*  GLUCOSE 137* 162*  BUN 39* 46*  CREATININE 5.10* 5.24*  CALCIUM 9.1 9.0   CBG (last 3)  Recent Labs    09/10/17 2136 09/11/17 0650 09/11/17 1142  GLUCAP 121* 140* 217*    Wt Readings from Last 3 Encounters:  09/10/17 62.3 kg (137 lb 5.6 oz)  08/30/17 69 kg (152 lb 1.9 oz)  08/23/17 69.2 kg (152 lb 8.9 oz)    Physical Exam:  BP (!) 146/119 (BP Location: Left Leg)   Pulse 85   Temp 98.1 F (36.7 C) (Oral)   Resp 18   Wt 62.3 kg (137 lb 5.6 oz)   SpO2 100%   BMI 22.86 kg/m  Constitutional: She appears well-developed and well-nourished. No distress.  HENT: Staples c/d/i Eyes: EOM are normal. No discharge.  Cardiovascular: RRR without murmur. No JVD . Respiratory: Effort normal and breath sounds normal.  GI: Bowel sounds are normal. She exhibits no distension.  Musculoskeletal:  Right great toe amputation  Left transmetatarsal   Neurological: She is alert.  Speech clear.  Able to follow simple motor commands without difficulty. A&O3.  Motor: RUE: 4/5 proximal to distal RLE: 3+-4-/5 HF, KE, ADF/PF (unchanged) Skin: Skin is warm and dry. She is not diaphoretic.  Right great toe amputation site dusky  Left transmetatarsal site well healed.  Right groin with dressing c/d/i Psychiatric: Normal mood, normal behavior.  Assessment/Plan: 1. 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 Physiatrist is providing close team supervision and 24 hour management of active medical problems listed below. Physiatrist and rehab team continue to assess barriers to discharge/monitor patient progress toward functional and medical goals.  Function:  Bathing Bathing position   Position: Wheelchair/chair at sink  Bathing parts Body parts bathed by patient: Right arm, Left arm, Chest, Abdomen, Right upper leg, Left upper leg Body parts bathed by helper: Buttocks  Bathing assist Assist Level: Touching or steadying assistance(Pt > 75%)      Upper Body Dressing/Undressing Upper body dressing   What is the patient wearing?: Pull over shirt/dress     Pull over shirt/dress - Perfomed by patient: Thread/unthread right sleeve, Thread/unthread left sleeve, Pull shirt over trunk, Put head through opening Pull over shirt/dress - Perfomed by helper: Put head through opening        Upper body assist Assist Level: Touching or steadying assistance(Pt > 75%)      Lower Body Dressing/Undressing Lower body dressing   What is the patient wearing?: Pants, Underwear Underwear - Performed by patient: Pull underwear up/down, Thread/unthread left underwear leg Underwear - Performed by helper: Thread/unthread right underwear leg Pants- Performed by patient: Thread/unthread left pants leg, Pull pants up/down Pants- Performed by helper: Thread/unthread right pants leg  Non-skid slipper socks- Performed by patient: Don/doff right sock, Don/doff left sock Non-skid slipper socks- Performed by helper: Don/doff right sock, Don/doff left sock Socks - Performed by patient: Don/doff left sock Socks - Performed by helper: Don/doff right sock Shoes - Performed by patient: Don/doff left shoe Shoes - Performed by helper: Don/doff right shoe, Fasten right, Fasten left     TED Hose - Performed  by patient: Don/doff left TED hose TED Hose - Performed by helper: Don/doff right TED hose, Don/doff left TED hose  Lower body assist Assist for lower body dressing: Touching or steadying assistance (Pt > 75%)(Mod assist)      Toileting Toileting Toileting activity did not occur: No continent bowel/bladder event Toileting steps completed by patient: Performs perineal hygiene, Adjust clothing prior to toileting, Adjust clothing after toileting Toileting steps completed by helper: Performs perineal hygiene, Adjust clothing after toileting Toileting Assistive Devices: Grab bar or rail  Toileting assist Assist level: Touching or steadying assistance (Pt.75%)   Transfers Chair/bed transfer   Chair/bed transfer method: Stand pivot Chair/bed transfer assist level: Supervision or verbal cues Chair/bed transfer assistive device: Medical sales representative Ambulation activity did not occur: Safety/medical concerns   Max distance: 75 Assist level: Supervision or verbal cues   Wheelchair   Type: Manual Max wheelchair distance: 70 Assist Level: Touching or steadying assistance (Pt > 75%)  Cognition Comprehension Comprehension assist level: Follows complex conversation/direction with extra time/assistive device  Expression Expression assist level: Expresses complex 90% of the time/cues < 10% of the time  Social Interaction Social Interaction assist level: Interacts appropriately with others with medication or extra time (anti-anxiety, antidepressant).  Problem Solving Problem solving assist level: Solves basic 90% of the time/requires cueing < 10% of the time  Memory Memory assist level: Recognizes or recalls 75 - 89% of the time/requires cueing 10 - 24% of the time    Medical Problem List and Plan: 1.  Generalized weakness, decreased balance as well as issues with delayed processing secondary to TBI with left SDH with evacuation on 1/30.   Cont CIR   Repeat head CT reviewed, slightly  improved.  No changes in management per Neurosurg, repeat CT again on 2/10 reviewed, showing stable/improving bleed. 2.  DVT Prophylaxis/Anticoagulation: Mechanical: Sequential compression devices, below knee Bilateral lower extremities 3. Pain Management: Limit use of hydrocodone as confusion reported.  4. Mood: LCSW to follow for evaluation and support.  5. Neuropsych: This patient is not capable of making decisions on her own behalf. 6. Skin/Wound Care: Monitor wound for healing.  7. Fluids/Electrolytes/Nutrition: Monitor I/O. Renal diet with 1200 FR.  8. ESRD: HD MWF at end of the day to help with tolerance of therapy 9. PAD/Right femoral to popliteal bypass: Wet to dry dressing to right groin wound bid.  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   10 T2DM: Monitor BS ac/hs. Continue to use SSI for elevated BS.    Added nephro with meals.    Levemir 5 units at bedtime started on 2/11, increased to 7 units on 2/15 (10 units PTA)   -some improvement in CBG's---continue current plan 11. HTN: Monitor BP bid   Hydralazine increased to 50 on 2/10   Labile with HD 2/15   Continue catapres, increase to TID  12. Neuropathy RLE: On gabapentin.  13. Acute on chronic anemia: On Nulecit IV with HD for iron loading and aranesp weekly.    Hb 10.0 on 2/13   Cont to monitor  Labs with HD 14. COPD: Encourage IS with flutter valve. Continue Dulera.  15. Seizure prophylaxis: On keppra tid. 16. Leukocytosis:    WBCs 11.7 on 2/13   Labs with HD   Cont to monitor   LOS (Days) 12 A FACE TO FACE EVALUATION WAS PERFORMED  Leilanie Rauda T 09/11/2017 12:42 PM

## 2017-09-11 NOTE — Progress Notes (Signed)
[facility] ROUNDING NOTE   Subjective:    End stage renal disease continues MWF dialysis  She has no complaints and is on rehab recovering from R fem pop when she developed  a Sub dural hematoma after falling in CIR 1/28  and craniotomy was perfprmed emergently .   R Fem pop site is healing well  Plan dialysis today   Objective:  Vital signs in last 24 hours:  Temp:  [97.7 F (36.5 C)-98.3 F (36.8 C)] 98.1 F (36.7 C) (02/16 0658) Pulse Rate:  [73-115] 85 (02/16 0953) Resp:  [16-19] 18 (02/16 0658) BP: (93-171)/(37-119) 146/119 (02/16 0953) SpO2:  [98 %-100 %] 100 % (02/16 0658) Weight:  [137 lb 5.6 oz (62.3 kg)-143 lb 11.8 oz (65.2 kg)] 137 lb 5.6 oz (62.3 kg) (02/15 1809)  Weight change: -15.2 oz (-2.7 kg) Filed Weights   09/10/17 0655 09/10/17 1350 09/10/17 1809  Weight: 149 lb 11.1 oz (67.9 kg) 143 lb 11.8 oz (65.2 kg) 137 lb 5.6 oz (62.3 kg)    Intake/Output: I/O last 3 completed shifts: In: 180 [P.O.:180] Out: 2104 [Other:2103; Stool:1]   Intake/Output this shift:  No intake/output data recorded.  CVS- RRR RS- CTA ABD- BS present soft non-distended EXT- no edema   Basic Metabolic Panel: Recent Labs  Lab 09/06/17 1315 09/08/17 1553 09/10/17 1400  NA 131* 131* 131*  K 3.6 4.0 3.6  CL 92* 93* 92*  CO2 25 26 27   GLUCOSE 172* 137* 162*  BUN 49* 39* 46*  CREATININE 5.97* 5.10* 5.24*  CALCIUM 9.1 9.1 9.0  PHOS 1.7* 1.4* 1.5*    Liver Function Tests: Recent Labs  Lab 09/06/17 1315 09/08/17 1553 09/10/17 1400  ALBUMIN 2.5* 2.5* 2.5*   No results for input(s): LIPASE, AMYLASE in the last 168 hours. No results for input(s): AMMONIA in the last 168 hours.  CBC: Recent Labs  Lab 09/06/17 1315 09/08/17 1553 09/10/17 1400  WBC 12.7* 11.7* 10.3  HGB 10.0* 10.0* 9.2*  HCT 31.7* 32.9* 29.5*  MCV 101.3* 102.8* 102.4*  PLT 253 267 230    Cardiac Enzymes: No results for input(s): CKTOTAL, CKMB, CKMBINDEX, TROPONINI in the last 168  hours.  BNP: Invalid input(s): POCBNP  CBG: Recent Labs  Lab 09/10/17 0655 09/10/17 1113 09/10/17 1850 09/10/17 2136 09/11/17 0650  GLUCAP 155* 148* 133* 121* 140*    Microbiology: Results for orders placed or performed during the hospital encounter of 08/23/17  Culture, Urine     Status: None   Collection Time: 08/24/17 10:51 AM  Result Value Ref Range Status   Specimen Description URINE, CATHETERIZED  Final   Special Requests NONE  Final   Culture NO GROWTH  Final   Report Status 08/25/2017 FINAL  Final  MRSA PCR Screening     Status: None   Collection Time: 08/27/17  2:49 AM  Result Value Ref Range Status   MRSA by PCR NEGATIVE NEGATIVE Final    Comment:        The GeneXpert MRSA Assay (FDA approved for NASAL specimens only), is one component of a comprehensive MRSA colonization surveillance program. It is not intended to diagnose MRSA infection nor to guide or monitor treatment for MRSA infections.     Coagulation Studies: No results for input(s): LABPROT, INR in the last 72 hours.  Urinalysis: No results for input(s): COLORURINE, LABSPEC, PHURINE, GLUCOSEU, HGBUR, BILIRUBINUR, KETONESUR, PROTEINUR, UROBILINOGEN, NITRITE, LEUKOCYTESUR in the last 72 hours.  Invalid input(s): APPERANCEUR    Imaging: No results  found.   Medications:    . cloNIDine  0.2 mg Oral BID  . darbepoetin (ARANESP) injection - DIALYSIS  150 mcg Intravenous Q Fri-HD  . feeding supplement (PRO-STAT SUGAR FREE 64)  30 mL Oral BID  . gabapentin  100 mg Oral QHS  . hydrALAZINE  50 mg Oral 2 times per day on Sun Tue Thu Sat  . hydrocerin   Topical BID  . insulin aspart  0-5 Units Subcutaneous QHS  . insulin aspart  0-9 Units Subcutaneous TID WC  . insulin detemir  7 Units Subcutaneous QHS  . mouth rinse  15 mL Mouth Rinse BID  . mometasone-formoterol  2 puff Inhalation BID  . pantoprazole  40 mg Oral Daily  . polyethylene glycol  17 g Oral Daily  . sodium hypochlorite    Irrigation Daily  . sucroferric oxyhydroxide  1,000 mg Oral TID WC   acetaminophen, albuterol, aluminum hydroxide, bisacodyl, cloNIDine, diphenhydrAMINE, fluticasone, guaiFENesin-dextromethorphan, HYDROcodone-acetaminophen, naLOXone (NARCAN)  injection, polyethylene glycol, prochlorperazine **OR** prochlorperazine **OR** prochlorperazine, simethicone, sodium phosphate, traZODone  Assessment/ Plan:  Dialysis: MWF [facility] 4h  66.5kg  3K/2.25 bath  LUA AVG  P4  No heparin (holding x 8 wks, can resume heparin 10/21/17) Venofer 50mg  IV qwk - last 1/11 Mircera 64mcg IV q2wks - last 1/09       Impression: 1  PAD sp R fem-pop 2  SDH sp craniotomy, rehab 3  ESRD HD mwf 4  Anemia ckd, darbe 150 q Fri, fe load in prog 125mg  x 3    Hb 9.2  5  HTN stable 6  Vol at dry wt 7  DM bs's good, minimal use of SSI 8  CKD MBD, resumed velphoro   Continues to do well in rehab      Continue dialysis MWF   LOS: 12 Herminio Kniskern W @TODAY @10 :10 AM

## 2017-09-11 NOTE — Plan of Care (Signed)
  Not Progressing RH SKIN INTEGRITY RH STG SKIN FREE OF INFECTION/BREAKDOWN Description Patient and family will be able to verbalize how to keep skin free of breakdown prior to discharge  09/11/2017 0157 - Not Progressing by Gwendolyn Grant, RN Note R groin dressing changed to Daikins wet to dry daily. RH STG ABLE TO PERFORM INCISION/WOUND CARE W/ASSISTANCE Description STG Able To Perform Incision/Wound Care With  Max  Assistance.  09/11/2017 0157 - Not Progressing by Gwendolyn Grant, RN Flowsheets Taken 09/11/2017 0157  STG: Pt will be able to perform incision/wound care with assistance 3-Moderate assistance Note Pt requires max assist with dressing change on groin and sacrum

## 2017-09-12 ENCOUNTER — Inpatient Hospital Stay (HOSPITAL_COMMUNITY): Payer: Medicare Other

## 2017-09-12 LAB — GLUCOSE, CAPILLARY
Glucose-Capillary: 164 mg/dL — ABNORMAL HIGH (ref 65–99)
Glucose-Capillary: 93 mg/dL (ref 65–99)
Glucose-Capillary: 191 mg/dL — ABNORMAL HIGH (ref 65–99)
Glucose-Capillary: 169 mg/dL — ABNORMAL HIGH (ref 65–99)

## 2017-09-12 NOTE — Progress Notes (Signed)
Patient refused to take staples out she said she wants to speak with her surgeon. Dr. Naaman Plummer notified.

## 2017-09-12 NOTE — Progress Notes (Signed)
Occupational Therapy Session Note  Patient Details  Name: NIJAH TEJERA MRN: [id number] Date of Birth: [birth year]  Today's Date: 09/12/2017 OT Individual Time: 3976-7341 OT Individual Time Calculation (min): 56 min    Short Term Goals: Week 1:  OT Short Term Goal 1 (Week 1): Pt will be able to sit to stand with min A of 1 person from w/c for LB self care. OT Short Term Goal 1 - Progress (Week 1): Met OT Short Term Goal 2 (Week 1): Pt will be able to don pants over feet with touching A. OT Short Term Goal 2 - Progress (Week 1): Met OT Short Term Goal 3 (Week 1): Pt will be able to transfer to [facility] or toilet with min A of 1. OT Short Term Goal 3 - Progress (Week 1): Met OT Short Term Goal 4 (Week 1): Pt will actively use her R hand with grooming activities with touching A. OT Short Term Goal 4 - Progress (Week 1): Met  Skilled Therapeutic Interventions/Progress Updates:    1:1. Pt engages in bathing and dressing at sit to stand level at sink. Pt completes all bathing with supervision and VC for hand placement during transitional movement. Pt able to don UB clothing with supervision and Vc to use reacher to grab clohting too far out of reach as opposed to leaning far over in w/c. Pt able to doff socks and OT dons teds/shoes for time management. Pt able to thread BLE into pants using reacher with VC and requires A to advance pants over hips d/t fitted nature/decreased grip strength in RUE. Exited session with pt seated in w/c, call light in reach and QRB/chair alarm on.  Therapy Documentation Precautions:  Precautions Precautions: Fall Required Braces or Orthoses: Other Brace/Splint Other Brace/Splint: Per rehab notes, pt had Darco shoe for Rt LE -pt stated she did not feel safe in Darco Restrictions Weight Bearing Restrictions: Yes RLE Weight Bearing: Weight bearing as tolerated  See Function Navigator for Current Functional Status.   Therapy/Group: Individual Therapy  Tonny Branch 09/12/2017, 12:21 PM

## 2017-09-12 NOTE — Progress Notes (Signed)
[place] PHYSICAL MEDICINE & REHABILITATION     PROGRESS NOTE  Subjective/Complaints:  Patient without new complaints.  Has mild headache at times but pain generally under control.  Has questions for her surgeon and wonders when she can speak with him  ROS: pt denies nausea, vomiting, diarrhea, cough, shortness of breath or chest pain   Objective: Vital Signs: Blood pressure (!) 141/63, pulse 88, temperature 98.3 F (36.8 C), temperature source Oral, resp. rate 18, weight 64.5 kg (142 lb 3.2 oz), SpO2 99 %. No results found. Recent Labs    09/10/17 1400  WBC 10.3  HGB 9.2*  HCT 29.5*  PLT 230   Recent Labs    09/10/17 1400  NA 131*  K 3.6  CL 92*  GLUCOSE 162*  BUN 46*  CREATININE 5.24*  CALCIUM 9.0   CBG (last 3)  Recent Labs    09/11/17 1643 09/11/17 2115 09/12/17 0705  GLUCAP 121* 139* 164*    Wt Readings from Last 3 Encounters:  09/12/17 64.5 kg (142 lb 3.2 oz)  08/30/17 69 kg (152 lb 1.9 oz)  08/23/17 69.2 kg (152 lb 8.9 oz)    Physical Exam:  BP (!) 141/63 (BP Location: Left Leg)   Pulse 88   Temp 98.3 F (36.8 C) (Oral)   Resp 18   Wt 64.5 kg (142 lb 3.2 oz)   SpO2 99%   BMI 23.66 kg/m  Constitutional: She appears well-developed and well-nourished. No distress.  HENT: Staples c/d/i Eyes: EOM are normal. No discharge.  Cardiovascular: RRR without murmur. No JVD . Respiratory: CTA Bilaterally without wheezes or rales. Normal effort  GI: Bowel sounds are normal. She exhibits no distension.  Musculoskeletal:  Right great toe amputation  Left transmetatarsal amp  Neurological: She is alert.  Speech clear.  Able to follow simple motor commands without difficulty. A&O3.  Motor: RUE: 4/5 proximal to distal RLE: 3+-4-/5 HF, KE, ADF/PF (stable) Skin: Skin is warm and dry. She is not diaphoretic.  Cranial incision clean and intact with staples Right great toe amputation site dusky  Left transmetatarsal site well healed.  Right groin with  dressing c/d/i Psychiatric: Normal mood, normal behavior.  Assessment/Plan: 1. 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 Physiatrist is providing close team supervision and 24 hour management of active medical problems listed below. Physiatrist and rehab team continue to assess barriers to discharge/monitor patient progress toward functional and medical goals.  Function:  Bathing Bathing position   Position: Wheelchair/chair at sink  Bathing parts Body parts bathed by patient: Right arm, Left arm, Chest, Abdomen, Right upper leg, Left upper leg Body parts bathed by helper: Buttocks  Bathing assist Assist Level: Touching or steadying assistance(Pt > 75%)      Upper Body Dressing/Undressing Upper body dressing   What is the patient wearing?: Pull over shirt/dress     Pull over shirt/dress - Perfomed by patient: Thread/unthread right sleeve, Thread/unthread left sleeve, Pull shirt over trunk, Put head through opening Pull over shirt/dress - Perfomed by helper: Put head through opening        Upper body assist Assist Level: Touching or steadying assistance(Pt > 75%)      Lower Body Dressing/Undressing Lower body dressing   What is the patient wearing?: Pants, Underwear Underwear - Performed by patient: Pull underwear up/down, Thread/unthread left underwear leg Underwear - Performed by helper: Thread/unthread right underwear leg  Pants- Performed by patient: Thread/unthread left pants leg, Pull pants up/down Pants- Performed by helper: Thread/unthread right pants leg Non-skid slipper socks- Performed by patient: Don/doff right sock, Don/doff left sock Non-skid slipper socks- Performed by helper: Don/doff right sock, Don/doff left sock Socks - Performed by patient: Don/doff left sock Socks - Performed by helper: Don/doff right sock Shoes - Performed by  patient: Don/doff left shoe Shoes - Performed by helper: Don/doff right shoe, Fasten right, Fasten left     TED Hose - Performed by patient: Don/doff left TED hose TED Hose - Performed by helper: Don/doff right TED hose, Don/doff left TED hose  Lower body assist Assist for lower body dressing: Touching or steadying assistance (Pt > 75%)(Mod assist)      Toileting Toileting Toileting activity did not occur: No continent bowel/bladder event Toileting steps completed by patient: Adjust clothing prior to toileting, Performs perineal hygiene, Adjust clothing after toileting Toileting steps completed by helper: Adjust clothing after toileting Toileting Assistive Devices: Grab bar or rail  Toileting assist Assist level: Touching or steadying assistance (Pt.75%), Supervision or verbal cues, More than reasonable time   Transfers Chair/bed transfer   Chair/bed transfer method: Stand pivot Chair/bed transfer assist level: Supervision or verbal cues Chair/bed transfer assistive device: Medical sales representative Ambulation activity did not occur: Safety/medical concerns   Max distance: 75 Assist level: Supervision or verbal cues   Wheelchair   Type: Manual Max wheelchair distance: 70 Assist Level: Touching or steadying assistance (Pt > 75%)  Cognition Comprehension Comprehension assist level: Follows complex conversation/direction with extra time/assistive device  Expression Expression assist level: Expresses complex 90% of the time/cues < 10% of the time  Social Interaction Social Interaction assist level: Interacts appropriately with others with medication or extra time (anti-anxiety, antidepressant).  Problem Solving Problem solving assist level: Solves basic 90% of the time/requires cueing < 10% of the time  Memory Memory assist level: Recognizes or recalls 90% of the time/requires cueing < 10% of the time    Medical Problem List and Plan: 1.  Generalized weakness, decreased  balance as well as issues with delayed processing secondary to TBI with left SDH with evacuation on 1/30.   Cont CIR   Repeat head CT reviewed, slightly improved.  No changes in management per Neurosurg, repeat CT again on 2/10 reviewed, showing stable/improving bleed. 2.  DVT Prophylaxis/Anticoagulation: Mechanical: Sequential compression devices, below knee Bilateral lower extremities 3. Pain Management: Limit use of hydrocodone as confusion reported.  4. Mood: LCSW to follow for evaluation and support.  5. Neuropsych: This patient is not capable of making decisions on her own behalf. 6. Skin/Wound Care: Monitor wound for healing.  Remove staples today 7. Fluids/Electrolytes/Nutrition: Monitor I/O. Renal diet with 1200 FR.  8. ESRD: HD MWF at end of the day to help with tolerance of therapy 9. PAD/Right femoral to popliteal bypass: Wet to dry dressing to right groin wound bid.  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   10 T2DM: Monitor BS ac/hs. Continue to use SSI for elevated BS.    Added nephro with meals.    Levemir 5 units at bedtime started on 2/11, increased to 7 units on 2/15 (10 units PTA)   -CBGs showing some improvement 11. HTN: Monitor BP bid   Hydralazine increased to 50 on 2/10   Showing improvement   Continue catapres, increased to 0.2mg  TID on 09/11/2017  12. Neuropathy RLE: On gabapentin.  13. Acute on chronic anemia: On Nulecit  IV with HD for iron loading and aranesp weekly.    Hb 10.0 on 2/13   Cont to monitor   Labs with HD 14. COPD: Encourage IS with flutter valve. Continue Dulera.  15. Seizure prophylaxis: On keppra tid. 16. Leukocytosis:    WBCs 11.7 on 2/13   Labs with HD   Cont to monitor   LOS (Days) 13 A FACE TO FACE EVALUATION WAS PERFORMED  SWARTZ,ZACHARY T 09/12/2017 9:21 AM

## 2017-09-12 NOTE — Progress Notes (Signed)
Physical Therapy Session Note  Patient Details  Name: MANDA HOLSTAD MRN: [id number] Date of Birth: 1951/02/14  Today's Date: 09/12/2017 PT Individual Time: 1330-1429 PT Individual Time Calculation (min): 59 min   Short Term Goals: Week 2:  PT Short Term Goal 1 (Week 2): =LTG  Skilled Therapeutic Interventions/Progress Updates:    Pt supine in bed upon PT arrival, agreeable to therapy tx and denies pain. Pt transferred from supine>sitting EOB with min assist, therapist donned shoes. Pt performed sit<>stand with min assist and stand pivot with RW from bed>w/c with min assist. Pt propelled w/c x 50 ft using B UEs with supervision, limited by fatigue. Pt transported the rest of the way in w/c. Pt ambulated 2 x 85 ft with RW and supervision, reports pulling in the back of R knee during ambulation. Pt standing on foam working on dynamic balance in order to complete peg board puzzle with R hand for neuro re-ed, min verbal cues for correct placement. Pt ambulated x 100 ft with RW and supervision, working on endurance and navigating obstacles. Pt transported back to room and left seated in w/c with needs in reach, chair alarm set and husband present.   Therapy Documentation Precautions:  Precautions Precautions: Fall Required Braces or Orthoses: Other Brace/Splint Other Brace/Splint: Per rehab notes, pt had Darco shoe for Rt LE -pt stated she did not feel safe in Darco Restrictions Weight Bearing Restrictions: Yes RLE Weight Bearing: Weight bearing as tolerated   See Function Navigator for Current Functional Status.   Therapy/Group: Individual Therapy  Netta Corrigan , PT, DPT 09/12/2017, 7:58 AM

## 2017-09-12 NOTE — Progress Notes (Signed)
Occupational Therapy Session Note  Patient Details  Name: Destiny Day MRN: [id number] Date of Birth: [birth year]  Today's Date: 09/12/2017 OT Individual Time: 1500-1526 OT Individual Time Calculation (min): 26 min    Short Term Goals: Week 2:  OT Short Term Goal 1 (Week 2): STG = LTGs due to remaining LOS  Skilled Therapeutic Interventions/Progress Updates:    1:1. Pt and husband present throughout session. Pt asleep requiring increased time for arousal. OT elected to complete in w/c activity d/t decreased arousal. Pt completes stringing bead activity seated in w/c with min Vc to follow pattern and decerase shoulder hike when reaching forward for beads with min tactile cueing. Pt requires increased time to process this session d/t fatigue. Pt returned to room with husband present and all needs met.   Therapy Documentation Precautions:  Precautions Precautions: Fall Required Braces or Orthoses: Other Brace/Splint Other Brace/Splint: Per rehab notes, pt had Darco shoe for Rt LE -pt stated she did not feel safe in Darco Restrictions Weight Bearing Restrictions: Yes RLE Weight Bearing: Weight bearing as tolerated General:    See Function Navigator for Current Functional Status.   Therapy/Group: Individual Therapy  Tonny Branch 09/12/2017, 3:28 PM

## 2017-09-12 NOTE — Progress Notes (Signed)
[facility] ROUNDING NOTE   Subjective:    End stage renal disease continues MWF dialysis  She has no complaints and is on rehab recovering from R fem pop when she developed  a Sub dural hematoma after falling in CIR 1/28  and craniotomy was perfprmed emergently .   R Fem pop site is healing well  Plan dialysis  MWF   Objective:  Vital signs in last 24 hours:  Temp:  [97.5 F (36.4 C)-98.3 F (36.8 C)] 98.3 F (36.8 C) (02/17 0452) Pulse Rate:  [73-89] 88 (02/17 0452) Resp:  [18] 18 (02/17 0452) BP: (139-166)/(56-89) 141/63 (02/17 0452) SpO2:  [99 %-100 %] 99 % (02/17 0452) Weight:  [142 lb 3.2 oz (64.5 kg)] 142 lb 3.2 oz (64.5 kg) (02/17 0421)  Weight change: -8.7 oz (-0.7 kg) Filed Weights   09/10/17 1350 09/10/17 1809 09/12/17 0421  Weight: 143 lb 11.8 oz (65.2 kg) 137 lb 5.6 oz (62.3 kg) 142 lb 3.2 oz (64.5 kg)    Intake/Output: I/O last 3 completed shifts: In: 540 [P.O.:540] Out: -    Intake/Output this shift:  Total I/O In: 120 [P.O.:120] Out: -   CVS- RRR RS- CTA ABD- BS present soft non-distended EXT- no edema   Basic Metabolic Panel: Recent Labs  Lab 09/06/17 1315 09/08/17 1553 09/10/17 1400  NA 131* 131* 131*  K 3.6 4.0 3.6  CL 92* 93* 92*  CO2 25 26 27   GLUCOSE 172* 137* 162*  BUN 49* 39* 46*  CREATININE 5.97* 5.10* 5.24*  CALCIUM 9.1 9.1 9.0  PHOS 1.7* 1.4* 1.5*    Liver Function Tests: Recent Labs  Lab 09/06/17 1315 09/08/17 1553 09/10/17 1400  ALBUMIN 2.5* 2.5* 2.5*   No results for input(s): LIPASE, AMYLASE in the last 168 hours. No results for input(s): AMMONIA in the last 168 hours.  CBC: Recent Labs  Lab 09/06/17 1315 09/08/17 1553 09/10/17 1400  WBC 12.7* 11.7* 10.3  HGB 10.0* 10.0* 9.2*  HCT 31.7* 32.9* 29.5*  MCV 101.3* 102.8* 102.4*  PLT 253 267 230    Cardiac Enzymes: No results for input(s): CKTOTAL, CKMB, CKMBINDEX, TROPONINI in the last 168 hours.  BNP: Invalid input(s):  POCBNP  CBG: Recent Labs  Lab 09/11/17 1142 09/11/17 1643 09/11/17 2115 09/12/17 0705 09/12/17 1129  GLUCAP 217* 121* 139* 164* 26    Microbiology: Results for orders placed or performed during the hospital encounter of 08/23/17  Culture, Urine     Status: None   Collection Time: 08/24/17 10:51 AM  Result Value Ref Range Status   Specimen Description URINE, CATHETERIZED  Final   Special Requests NONE  Final   Culture NO GROWTH  Final   Report Status 08/25/2017 FINAL  Final  MRSA PCR Screening     Status: None   Collection Time: 08/27/17  2:49 AM  Result Value Ref Range Status   MRSA by PCR NEGATIVE NEGATIVE Final    Comment:        The GeneXpert MRSA Assay (FDA approved for NASAL specimens only), is one component of a comprehensive MRSA colonization surveillance program. It is not intended to diagnose MRSA infection nor to guide or monitor treatment for MRSA infections.     Coagulation Studies: No results for input(s): LABPROT, INR in the last 72 hours.  Urinalysis: No results for input(s): COLORURINE, LABSPEC, PHURINE, GLUCOSEU, HGBUR, BILIRUBINUR, KETONESUR, PROTEINUR, UROBILINOGEN, NITRITE, LEUKOCYTESUR in the last 72 hours.  Invalid input(s): APPERANCEUR    Imaging: No results found.  Medications:    . cloNIDine  0.2 mg Oral TID  . darbepoetin (ARANESP) injection - DIALYSIS  150 mcg Intravenous Q Fri-HD  . feeding supplement (PRO-STAT SUGAR FREE 64)  30 mL Oral BID  . gabapentin  100 mg Oral QHS  . hydrALAZINE  50 mg Oral 2 times per day on Sun Tue Thu Sat  . hydrocerin   Topical BID  . insulin aspart  0-5 Units Subcutaneous QHS  . insulin aspart  0-9 Units Subcutaneous TID WC  . insulin detemir  7 Units Subcutaneous QHS  . mouth rinse  15 mL Mouth Rinse BID  . mometasone-formoterol  2 puff Inhalation BID  . pantoprazole  40 mg Oral Daily  . polyethylene glycol  17 g Oral Daily  . sodium hypochlorite   Irrigation Daily  . sucroferric  oxyhydroxide  1,000 mg Oral TID WC   acetaminophen, albuterol, aluminum hydroxide, bisacodyl, cloNIDine, diphenhydrAMINE, fluticasone, guaiFENesin-dextromethorphan, HYDROcodone-acetaminophen, naLOXone (NARCAN)  injection, polyethylene glycol, prochlorperazine **OR** prochlorperazine **OR** prochlorperazine, simethicone, sodium phosphate, traZODone  Assessment/ Plan:  Dialysis: [facility]WF [facility] 4h  66.5kg  3K/2.25 bath  LUA AVG  P4  No heparin (holding x 8 wks, can resume heparin 10/21/17) Venofer 50mg  IV qwk - last 1/11 Mircera 14mcg IV q2wks - last 1/09       Impression: 1  PAD sp R fem-pop 2  SDH sp craniotomy, rehab 3  ESRD HD mwf 4  Anemia ckd, darbe 150 q Fri, fe load in prog 125mg  x 3    Hb 9.2  5  HTN stable 6  Vol at dry wt 7  DM bs's good, minimal use of SSI 8  CKD MBD, resumed velphoro   Continues to do well in rehab      Continue dialysis MWF   LOS: 13 Mohsen Odenthal W @TODAY @11 :38 AM

## 2017-09-13 ENCOUNTER — Inpatient Hospital Stay (HOSPITAL_COMMUNITY): Payer: Medicare Other | Admitting: Occupational Therapy

## 2017-09-13 ENCOUNTER — Encounter (HOSPITAL_COMMUNITY): Payer: Medicare Other | Admitting: Psychology

## 2017-09-13 ENCOUNTER — Inpatient Hospital Stay (HOSPITAL_COMMUNITY): Payer: Medicare Other | Admitting: Physical Therapy

## 2017-09-13 ENCOUNTER — Inpatient Hospital Stay (HOSPITAL_COMMUNITY): Payer: Medicare Other | Admitting: Speech Pathology

## 2017-09-13 LAB — CBC
HCT: 31.2 % — ABNORMAL LOW (ref 36.0–46.0)
Hemoglobin: 9.6 g/dL — ABNORMAL LOW (ref 12.0–15.0)
MCH: 31.9 pg (ref 26.0–34.0)
MCHC: 30.8 g/dL (ref 30.0–36.0)
MCV: 103.7 fL — ABNORMAL HIGH (ref 78.0–100.0)
Platelets: 243 10*3/uL (ref 150–400)
RBC: 3.01 MIL/uL — ABNORMAL LOW (ref 3.87–5.11)
RDW: 16.7 % — ABNORMAL HIGH (ref 11.5–15.5)
WBC: 11.7 10*3/uL — ABNORMAL HIGH (ref 4.0–10.5)

## 2017-09-13 LAB — GLUCOSE, CAPILLARY
Glucose-Capillary: 126 mg/dL — ABNORMAL HIGH (ref 65–99)
Glucose-Capillary: 167 mg/dL — ABNORMAL HIGH (ref 65–99)
Glucose-Capillary: 92 mg/dL (ref 65–99)
Glucose-Capillary: 231 mg/dL — ABNORMAL HIGH (ref 65–99)

## 2017-09-13 LAB — RENAL FUNCTION PANEL
Albumin: 2.5 g/dL — ABNORMAL LOW (ref 3.5–5.0)
Anion gap: 12 (ref 5–15)
BUN: 44 mg/dL — ABNORMAL HIGH (ref 6–20)
CO2: 25 mmol/L (ref 22–32)
Calcium: 9.2 mg/dL (ref 8.9–10.3)
Chloride: 93 mmol/L — ABNORMAL LOW (ref 101–111)
Creatinine, Ser: 6.07 mg/dL — ABNORMAL HIGH (ref 0.44–1.00)
GFR calc Af Amer: 8 mL/min — ABNORMAL LOW (ref >60)
GFR calc non Af Amer: 6 mL/min — ABNORMAL LOW (ref >60)
Glucose, Bld: 170 mg/dL — ABNORMAL HIGH (ref 65–99)
Phosphorus: 1.2 mg/dL — ABNORMAL LOW (ref 2.5–4.6)
Potassium: 4.7 mmol/L (ref 3.5–5.1)
Sodium: 130 mmol/L — ABNORMAL LOW (ref 135–145)

## 2017-09-13 MED ORDER — LIDOCAINE HCL (PF) 1 % IJ SOLN: 5.0000 mL | INTRAMUSCULAR | Status: DC | PRN

## 2017-09-13 MED ORDER — ALTEPLASE 2 MG IJ SOLR: 2.0000 mg | Freq: Once | INTRAMUSCULAR | Status: DC | PRN

## 2017-09-13 MED ORDER — HEPARIN SODIUM (PORCINE) 1000 UNIT/ML DIALYSIS: 1000.0000 [IU] | INTRAMUSCULAR | Status: DC | PRN

## 2017-09-13 MED ORDER — SODIUM CHLORIDE 0.9 % IV SOLN: 100.0000 mL | INTRAVENOUS | Status: DC | PRN

## 2017-09-13 MED ORDER — PENTAFLUOROPROP-TETRAFLUOROETH EX AERO: 1.0000 "application " | INHALATION_SPRAY | CUTANEOUS | Status: DC | PRN

## 2017-09-13 MED ORDER — LIDOCAINE-PRILOCAINE 2.5-2.5 % EX CREA: 1.0000 "application " | TOPICAL_CREAM | CUTANEOUS | Status: DC | PRN

## 2017-09-13 MED ORDER — HEPARIN SODIUM (PORCINE) 1000 UNIT/ML DIALYSIS
1000.0000 [IU] | INTRAMUSCULAR | Status: DC | PRN
  Filled 2017-09-13: qty 1

## 2017-09-13 NOTE — Progress Notes (Signed)
Occupational Therapy Session Note  Patient Details  Name: Destiny Day MRN: [id number] Date of Birth: 1951-07-18  Today's Date: 09/13/2017 OT Individual Time: 0730-0830 OT Individual Time Calculation (min): 60 min    Short Term Goals: Week 2:  OT Short Term Goal 1 (Week 2): STG = LTGs due to remaining LOS  Skilled Therapeutic Interventions/Progress Updates:    Treatment session with focus on sit <> stand, standing balance, and increased independence with LB dressing.  Pt received asleep in bed requiring increased time to fully arouse to participate in treatment session.  Pt reports need to toilet.  Completed bed mobility with supervision and increased time.  Stand pivot transfer bed > w/c > toilet with RW with close supervision.  Pt able to complete clothing management during toileting task with supervision.  Addressed LB dressing with use of reacher and figure 4 position to increase success with donning/doffing socks/shoes.  Utilized plastic over foot to decrease friction while attempting to don TEDS, pt continuing to require assistance due to RUE weakness and difficulty with grasp to pull stockings up.  Unable to tie shoes this session, reporting increased difficulty even before fall - may benefit from shoe button.  Pt setup for breakfast, requiring assist to open containers due to decreased Rt hand grasp.  Therapy Documentation Precautions:  Precautions Precautions: Fall Required Braces or Orthoses: Other Brace/Splint Other Brace/Splint: Per rehab notes, pt had Darco shoe for Rt LE -pt stated she did not feel safe in Darco Restrictions Weight Bearing Restrictions: Yes RLE Weight Bearing: Weight bearing as tolerated General:   Vital Signs: Oxygen Therapy SpO2: 100 % O2 Device: Not Delivered Pain:  Pt with no c/o pain  See Function Navigator for Current Functional Status.   Therapy/Group: Individual Therapy  Simonne Come 09/13/2017, 9:42 AM

## 2017-09-13 NOTE — Progress Notes (Signed)
SLP Cancellation Note  Patient Details Name: Destiny Day MRN: [id number] DOB: 1951/04/27   Cancelled treatment:        Pt missed 30 minutes of skilled ST d/t pt being in HD.                                                                                                 Amreen Raczkowski 09/13/2017, 2:44 PM

## 2017-09-13 NOTE — Progress Notes (Signed)
[facility] ROUNDING NOTE   Subjective:    End stage renal disease continues MWF dialysis  She has no complaints and is on rehab recovering from R fem pop when she developed  a Sub dural hematoma after falling in CIR 1/28  and craniotomy was perfprmed emergently .       Objective:  Vital signs in last 24 hours:  Temp:  [98.4 F (36.9 C)] 98.4 F (36.9 C) (02/18 0500) Pulse Rate:  [82-90] 90 (02/18 0500) Resp:  [16] 16 (02/18 0500) BP: (117-128)/(91-99) 128/99 (02/18 0500) SpO2:  [99 %-100 %] 100 % (02/18 0720) Weight:  [64.8 kg (142 lb 13.7 oz)] 64.8 kg (142 lb 13.7 oz) (02/18 0500)  Weight change: 0.3 kg (10.6 oz) Filed Weights   09/10/17 1809 09/12/17 0421 09/13/17 0500  Weight: 62.3 kg (137 lb 5.6 oz) 64.5 kg (142 lb 3.2 oz) 64.8 kg (142 lb 13.7 oz)    Intake/Output: I/O last 3 completed shifts: In: 45 [P.O.:420] Out: -    Intake/Output this shift:  Total I/O In: 120 [P.O.:120] Out: -   CVS- RRR RS- CTA ABD- BS present soft non-distended EXT- no edema   Basic Metabolic Panel: Recent Labs  Lab 09/08/17 1553 09/10/17 1400  NA 131* 131*  K 4.0 3.6  CL 93* 92*  CO2 26 27  GLUCOSE 137* 162*  BUN 39* 46*  CREATININE 5.10* 5.24*  CALCIUM 9.1 9.0  PHOS 1.4* 1.5*    Liver Function Tests: Recent Labs  Lab 09/08/17 1553 09/10/17 1400  ALBUMIN 2.5* 2.5*   No results for input(s): LIPASE, AMYLASE in the last 168 hours. No results for input(s): AMMONIA in the last 168 hours.  CBC: Recent Labs  Lab 09/08/17 1553 09/10/17 1400  WBC 11.7* 10.3  HGB 10.0* 9.2*  HCT 32.9* 29.5*  MCV 102.8* 102.4*  PLT 267 230    Cardiac Enzymes: No results for input(s): CKTOTAL, CKMB, CKMBINDEX, TROPONINI in the last 168 hours.  BNP: Invalid input(s): POCBNP  CBG: Recent Labs  Lab 09/12/17 1129 09/12/17 1654 09/12/17 2106 09/13/17 0655 09/13/17 1223  GLUCAP 93 191* 169* 126* 167*    Microbiology: Results for orders placed or performed  during the hospital encounter of 08/23/17  Culture, Urine     Status: None   Collection Time: 08/24/17 10:51 AM  Result Value Ref Range Status   Specimen Description URINE, CATHETERIZED  Final   Special Requests NONE  Final   Culture NO GROWTH  Final   Report Status 08/25/2017 FINAL  Final  MRSA PCR Screening     Status: None   Collection Time: 08/27/17  2:49 AM  Result Value Ref Range Status   MRSA by PCR NEGATIVE NEGATIVE Final    Comment:        The GeneXpert MRSA Assay (FDA approved for NASAL specimens only), is one component of a comprehensive MRSA colonization surveillance program. It is not intended to diagnose MRSA infection nor to guide or monitor treatment for MRSA infections.     Coagulation Studies: No results for input(s): LABPROT, INR in the last 72 hours.  Urinalysis: No results for input(s): COLORURINE, LABSPEC, PHURINE, GLUCOSEU, HGBUR, BILIRUBINUR, KETONESUR, PROTEINUR, UROBILINOGEN, NITRITE, LEUKOCYTESUR in the last 72 hours.  Invalid input(s): APPERANCEUR    Imaging: No results found.   Medications:    . cloNIDine  0.2 mg Oral TID  . darbepoetin (ARANESP) injection - DIALYSIS  150 mcg Intravenous Q Fri-HD  . feeding supplement (PRO-STAT SUGAR FREE 64)  30 mL Oral BID  . gabapentin  100 mg Oral QHS  . hydrALAZINE  50 mg Oral 2 times per day on Sun Tue Thu Sat  . hydrocerin   Topical BID  . insulin aspart  0-5 Units Subcutaneous QHS  . insulin aspart  0-9 Units Subcutaneous TID WC  . insulin detemir  7 Units Subcutaneous QHS  . mouth rinse  15 mL Mouth Rinse BID  . mometasone-formoterol  2 puff Inhalation BID  . pantoprazole  40 mg Oral Daily  . polyethylene glycol  17 g Oral Daily  . sucroferric oxyhydroxide  1,000 mg Oral TID WC   acetaminophen, albuterol, aluminum hydroxide, bisacodyl, cloNIDine, diphenhydrAMINE, fluticasone, guaiFENesin-dextromethorphan, HYDROcodone-acetaminophen, naLOXone (NARCAN)  injection, polyethylene glycol,  prochlorperazine **OR** prochlorperazine **OR** prochlorperazine, simethicone, sodium phosphate, traZODone  Assessment/ Plan:  Dialysis: [facility] 4h  66.5kg  3K/2.25 bath  LUA AVG  P4  No heparin (holding x 8 wks, can resume heparin 10/21/17) Venofer 50mg  IV qwk - last 1/11 Mircera 57mcg IV q2wks - last 1/09       Impression: 1  PAD sp R fem-pop 2  SDH sp craniotomy, rehab 3  ESRD HD mwf 4  Anemia ckd, darbe 150 q Fri, fe load in prog 125mg  x 3    Hb 9.2  5  HTN stable 6  Vol at dry wt 7  DM on SSI 8  CKD MBD, resumed velphoro  P - HD today, possible dc later this week per family   Kelly Splinter MD [employer] pgr [phone]   09/13/2017, 2:19 PM

## 2017-09-13 NOTE — Progress Notes (Signed)
Physical Therapy Note  Patient Details  Name: Destiny Day MRN: [id number] Date of Birth: 11-13-1950 Today's Date: 09/13/2017    Time: [phone] 58 minutes  1:1 No c/o pain.  Pt consistently performs sit <> stand with supervision with RW.  Gait in controlled environment x 120' with supervision with RW.  Gait with obstacle negotiation and stepping over obstacles with RW initially with min A, progressed to close supervision with cuing.  Stair negotiation 2 x 4 stairs with 1 railing and SPC with min A.  W/c mobility throughout unit with supervision x 150'.   Koryn Charlot 09/13/2017, 10:29 AM

## 2017-09-13 NOTE — Progress Notes (Signed)
[facility] PHYSICAL MEDICINE & REHABILITATION     PROGRESS NOTE  Subjective/Complaints:  Pt seen lying in bed this AM.  She slept well overnight.  She states she had a good weekend.  She does not want anyone to take out her staples until she speaks with Neurosurg.  She states she has many questions.   ROS: Denies nausea, vomiting, diarrhea, shortness of breath or chest pain   Objective: Vital Signs: Blood pressure (!) 128/99, pulse 90, temperature 98.4 F (36.9 C), temperature source Oral, resp. rate 16, weight 64.8 kg (142 lb 13.7 oz), SpO2 100 %. No results found. Recent Labs    09/10/17 1400  WBC 10.3  HGB 9.2*  HCT 29.5*  PLT 230   Recent Labs    09/10/17 1400  NA 131*  K 3.6  CL 92*  GLUCOSE 162*  BUN 46*  CREATININE 5.24*  CALCIUM 9.0   CBG (last 3)  Recent Labs    09/12/17 1654 09/12/17 2106 09/13/17 0655  GLUCAP 191* 169* 126*    Wt Readings from Last 3 Encounters:  09/13/17 64.8 kg (142 lb 13.7 oz)  08/30/17 69 kg (152 lb 1.9 oz)  08/23/17 69.2 kg (152 lb 8.9 oz)    Physical Exam:  BP (!) 128/99 (BP Location: Left Arm)   Pulse 90   Temp 98.4 F (36.9 C) (Oral)   Resp 16   Wt 64.8 kg (142 lb 13.7 oz)   SpO2 100%   BMI 23.77 kg/m  Constitutional: She appears well-developed and well-nourished. No distress.  HENT: Staples c/d/i Eyes: EOM are normal. No discharge.  Cardiovascular: RRR. No JVD . Respiratory: CTA Bilaterally. Normal effort  GI: Bowel sounds are normal. She exhibits no distension.  Musculoskeletal:  Right great toe amputation  Left transmetatarsal amp  Neurological: She is alert.  Speech clear.  Able to follow simple motor commands without difficulty. A&O.  Motor: RUE: 4/5 proximal to distal RLE: 3+-4-/5 HF, KE, ADF/PF (unchanged) Skin: Skin is warm and dry. She is not diaphoretic.  Cranial incision clean and intact with staples Right great toe amputation site dusky  Left transmetatarsal site well healed.  Right groin with  dressing c/d/i Psychiatric: Normal mood, normal behavior.  Assessment/Plan: 1. 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 Physiatrist is providing close team supervision and 24 hour management of active medical problems listed below. Physiatrist and rehab team continue to assess barriers to discharge/monitor patient progress toward functional and medical goals.  Function:  Bathing Bathing position   Position: Wheelchair/chair at sink  Bathing parts Body parts bathed by patient: Right arm, Left arm, Chest, Abdomen, Right upper leg, Left upper leg Body parts bathed by helper: Buttocks  Bathing assist Assist Level: Touching or steadying assistance(Pt > 75%)      Upper Body Dressing/Undressing Upper body dressing   What is the patient wearing?: Pull over shirt/dress     Pull over shirt/dress - Perfomed by patient: Thread/unthread right sleeve, Thread/unthread left sleeve, Pull shirt over trunk, Put head through opening Pull over shirt/dress - Perfomed by helper: Put head through opening        Upper body assist Assist Level: Touching or steadying assistance(Pt > 75%)      Lower Body Dressing/Undressing Lower body dressing   What is the patient wearing?: Pants, Underwear Underwear - Performed by patient: Pull underwear up/down, Thread/unthread left underwear leg Underwear -  Performed by helper: Thread/unthread right underwear leg Pants- Performed by patient: Thread/unthread left pants leg, Pull pants up/down Pants- Performed by helper: Thread/unthread right pants leg Non-skid slipper socks- Performed by patient: Don/doff right sock, Don/doff left sock Non-skid slipper socks- Performed by helper: Don/doff right sock, Don/doff left sock Socks - Performed by patient: Don/doff left sock Socks - Performed by helper: Don/doff right sock Shoes - Performed by  patient: Don/doff left shoe Shoes - Performed by helper: Don/doff right shoe, Fasten right, Fasten left     TED Hose - Performed by patient: Don/doff left TED hose TED Hose - Performed by helper: Don/doff right TED hose, Don/doff left TED hose  Lower body assist Assist for lower body dressing: Touching or steadying assistance (Pt > 75%)(Mod assist)      Toileting Toileting Toileting activity did not occur: No continent bowel/bladder event Toileting steps completed by patient: Adjust clothing prior to toileting, Performs perineal hygiene Toileting steps completed by helper: Adjust clothing after toileting Toileting Assistive Devices: Grab bar or rail  Toileting assist Assist level: More than reasonable time   Transfers Chair/bed transfer   Chair/bed transfer method: Stand pivot Chair/bed transfer assist level: Supervision or verbal cues Chair/bed transfer assistive device: Armrests, Medical sales representative Ambulation activity did not occur: Safety/medical concerns   Max distance: 100 ft Assist level: Supervision or verbal cues   Wheelchair   Type: Manual Max wheelchair distance: 70 Assist Level: Touching or steadying assistance (Pt > 75%)  Cognition Comprehension Comprehension assist level: Follows complex conversation/direction with extra time/assistive device  Expression Expression assist level: Expresses complex 90% of the time/cues < 10% of the time  Social Interaction Social Interaction assist level: Interacts appropriately with others with medication or extra time (anti-anxiety, antidepressant).  Problem Solving Problem solving assist level: Solves complex 90% of the time/cues < 10% of the time  Memory Memory assist level: Recognizes or recalls 90% of the time/requires cueing < 10% of the time    Medical Problem List and Plan: 1.  Generalized weakness, decreased balance as well as issues with delayed processing secondary to TBI with left SDH with evacuation on  1/30.   Cont CIR   Repeat head CT reviewed, slightly improved.  No changes in management per Neurosurg, repeat CT again on 2/10 reviewed, showing stable/improving bleed.   Will speak with Neurosurg regarding patient request 2.  DVT Prophylaxis/Anticoagulation: Mechanical: Sequential compression devices, below knee Bilateral lower extremities 3. Pain Management: Limit use of hydrocodone as confusion reported.  4. Mood: LCSW to follow for evaluation and support.  5. Neuropsych: This patient is not capable of making decisions on her own behalf. 6. Skin/Wound Care: Monitor wound for healing.  Remove staples today 7. Fluids/Electrolytes/Nutrition: Monitor I/O. Renal diet with 1200 FR.  8. ESRD: HD MWF at end of the day to help with tolerance of therapy 9. PAD/Right femoral to popliteal bypass: Wet to dry dressing to right groin wound bid.  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   10 T2DM: Monitor BS ac/hs. Continue to use SSI for elevated BS.    Added nephro with meals.    Levemir 5 units at bedtime started on 2/11, increased to 7 units on 2/15 (10 units PTA)   Labile on 2/18 11. HTN: Monitor BP bid   Hydralazine increased to 50 on 2/10   Relatively controlled on 2/18   Continue catapres, increased to 0.2mg  TID on 09/11/2017  12. Neuropathy RLE: On gabapentin.  13. Acute  on chronic anemia: On Nulecit IV with HD for iron loading and aranesp weekly.    Hb 9.2 on 2/15   Cont to monitor   Labs with HD 14. COPD: Encourage IS with flutter valve. Continue Dulera.  15. Seizure prophylaxis: On keppra tid. 16. Leukocytosis:    WBCs 10.3 on 2/15   Labs with HD   Cont to monitor   LOS (Days) 14 A FACE TO FACE EVALUATION WAS PERFORMED  Costella Schwarz Lorie Phenix 09/13/2017 9:38 AM

## 2017-09-14 ENCOUNTER — Inpatient Hospital Stay (HOSPITAL_COMMUNITY): Payer: Medicare Other | Admitting: Speech Pathology

## 2017-09-14 ENCOUNTER — Inpatient Hospital Stay (HOSPITAL_COMMUNITY): Payer: Medicare Other | Admitting: Occupational Therapy

## 2017-09-14 ENCOUNTER — Encounter: Payer: Medicare Other | Admitting: Vascular Surgery

## 2017-09-14 ENCOUNTER — Inpatient Hospital Stay (HOSPITAL_COMMUNITY): Payer: Medicare Other | Admitting: Physical Therapy

## 2017-09-14 DIAGNOSIS — S065X0D Traumatic subdural hemorrhage without loss of consciousness, subsequent encounter: Secondary | ICD-10-CM

## 2017-09-14 LAB — GLUCOSE, CAPILLARY
Glucose-Capillary: 153 mg/dL — ABNORMAL HIGH (ref 65–99)
Glucose-Capillary: 230 mg/dL — ABNORMAL HIGH (ref 65–99)
Glucose-Capillary: 127 mg/dL — ABNORMAL HIGH (ref 65–99)
Glucose-Capillary: 127 mg/dL — ABNORMAL HIGH (ref 65–99)

## 2017-09-14 NOTE — Progress Notes (Signed)
Speech Language Pathology Daily Session Note  Patient Details  Name: Destiny Day MRN: [id number] Date of Birth: January 03, 1951  Today's Date: 09/14/2017 SLP Individual Time: 1005-1030 SLP Individual Time Calculation (min): 25 min  Short Term Goals: Week 2: SLP Short Term Goal 1 (Week 2): Pt will consume regular textures with mod I use of swallowing precautions and no overt s/s of aspiration  SLP Short Term Goal 2 (Week 2): Pt will demonstrate functional problem solving for semi-complex tasks with supervision verbal cues.  SLP Short Term Goal 3 (Week 2): Pt will recall novel and functional information with supervision verbal cues for use of external aids.  SLP Short Term Goal 4 (Week 2): Pt will anticipate potential barriers in the home environment and generate safe solutions with supervision cues.    Skilled Therapeutic Interventions:   Pt was seen for skilled ST targeting cognitive goals.  SLP administered portions of the CLQT to measure progress from initial evaluation given imminent discharge.  Pt scored 0/12 on symbol cancellation subtest, she endorsed difficulty with task but was unable to attribute difficulty to any specific impairment (ie visual versus attention).  Pt scored 10/10 on confrontational naming subtest [facility]) and 11/13 on clock drawing subtest (below [facility]). Will complete assessment during tomorrow's scheduled therapy session.   Pt was returned to room and left in wheelchair with call bell within reach and husband at bedside.  Continue per current plan of care.     Function:  Eating Eating                 Cognition Comprehension Comprehension assist level: Follows complex conversation/direction with extra time/assistive device  Expression   Expression assist level: Expresses complex 90% of the time/cues < 10% of the time  Social Interaction Social Interaction assist level: Interacts appropriately with others with medication or extra time (anti-anxiety,  antidepressant).  Problem Solving Problem solving assist level: Solves basic 90% of the time/requires cueing < 10% of the time  Memory Memory assist level: Recognizes or recalls 90% of the time/requires cueing < 10% of the time    Pain Pain Assessment Pain Assessment: No/denies pain  Therapy/Group: Individual Therapy  Destiny Day, Selinda Orion 09/14/2017, 11:11 AM

## 2017-09-14 NOTE — Consult Note (Signed)
Neuropsychological Consultation   Patient:   Destiny Day   DOB:   03/26/1951  MR Number:  [id number]  Location:  [facility] [id number] [place] [address] Dept: [phone] GUR: [phone]           Date of Service:   09/13/2017  Start Time:   11 AM End Time:   12 PM  Provider/Observer:  Ilean Skill, Psy.D.       Clinical Neuropsychologist       Billing Code/Service: [phone] 4 Units  Chief Complaint:    Destiny Day is a 67 year old female with a history of end-stage renal disease, congestive heart Day, Destiny, hypertension, and amputation in February 2018.  The patient was admitted on 08/11/2017 with ischemic right foot with gangrenous changes and underwent right femoral below the knee BG and right great toe amputation.  The patient had been medically stable but then on 08/23/2017 sustained a fall.  With immediate CT was performed showing a left subdural hematoma.  There was a decline in mental status and she was taken to the OR for emergency surgery including left craniotomy the same day by Dr. Christella Noa.  Reason for Service:  HPI:  Destiny Day, Destiny Day, Destiny Day, Destiny Day, Destiny s/p 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 History taken from chart review and husband. She was admitted on 08/11/17 with ischemic right foot with gangrenous changes and underwent right femoral to below knee BG and right great toe amputation by Dr. Donnetta Hutching. Post op WBAT and was admitted to rehab 1/21/2019for inpatient therapies to consist of PT and OT at least three hours five days a week.   She was medically stable and right great toe amputation site as well as RLE bypass incisions were healing well. Reactive leucocytosis was resolving and ABLA was stable. She was progressing to modified independent level but  on 08/23/17 she sustained a fall. CT ordered and reviewed, showing left SDH.  Per report, large left SDH.  She had a decline in MS. She was taken to OR emergently for left craniotomy the same day by Dr. Christella Noa. She developed recurrent acute SDH requiring surgical evacuation on 01/30. hospital course significant for labile BP requiring Cardene drip, hyponatremia as well as VDRF. She tolerated extubation on 01/31 and mentation improving with occasional bouts of confusion. Dr. Donnetta Hutching following for input and right groin wound noted to have fat necrosis which was debrided at bedside today with recommendations for wet to dry dressing changes. Therapy evaluations done and CIR recommended due to generalized weakness, decreased balance as well as issues with delayed processing.   Current Status:  The patient continued to have significant confusion but has been showing significant improvement in overall cognitive functioning.  There were a lot of questions from both the patient as well as her husband about expectations for recovery.  Behavioral Observation: Destiny Day  presents as a 67 y.o.-year-old Right African American Female who appeared her stated age. her dress was Appropriate and she was Well Groomed and her manners were Appropriate to the situation.  her participation was indicative of Appropriate behaviors.  There were any physical disabilities noted.  she displayed an appropriate level of cooperation and motivation.     Interactions:    Active Redirectable  Attention:   abnormal and attention span appeared shorter than expected for age  Memory:   abnormal; remote  memory intact, recent memory impaired  Visuo-spatial:  not examined  Speech (Volume):  low  Speech:   normal;   Thought Process:  Coherent  Though Content:  WNL; not suicidal and not homicidal  Orientation:   person, place and  situation  Judgment:   Fair  Planning:   Fair  Affect:    Depressed  Mood:    Dysphoric  Insight:   Fair  Intelligence:   normal   Medical History:   Past Medical History:  Diagnosis Date  . Abdominal bruit   . Anemia   . Anxiety   . Arthritis    Osteoarthritis  . Asthma   . Cervical disc disease    "pinced nerve"  . CHF (congestive heart Day) ([place])   . Complication of anesthesia    " after I got home from my last procedure, I started itching."  . Destiny Day    Type II  . Diverticulitis   . ESRD on peritoneal dialysis [facility])    Hemodialysis - MWF- [place]   . GERD (gastroesophageal reflux disease)    from medications  . GI bleed 03/31/2013  . History of hiatal hernia   . Hyperlipidemia   . Hypertension   . Neuropathy    left leg  . Osteoporosis   . Peripheral vascular disease ([place])   . Pneumonia    "very young"  . Seasonal allergies   . Shortness of breath dyspnea    WIth exertion  . Sleep apnea    can't afford cpap    Psychiatric History:  Prior history of anxiety  Family Med/Psych History:  Family History  Problem Relation Age of Onset  . Other Mother        not sure of cause of death  . Destiny Father   . Pancreatic cancer Maternal Grandmother   . Colon cancer Neg Hx     Risk of Suicide/Violence: virtually non-existent   Impression/DX:  Destiny Day is a 67 year old female with a history of end-stage renal disease, congestive heart Day, Destiny, hypertension, and amputation in February 2018.  The patient was admitted on 08/11/2017 with ischemic right foot with gangrenous changes and underwent right femoral below the knee BG and right great toe amputation.  The patient had been medically stable but then on 08/23/2017 sustained a fall.  With immediate CT was performed showing a left subdural hematoma.  There was a decline in mental status and she was taken to the OR for emergency surgery including left craniotomy the same day by  Dr. Christella Noa.  The patient continued to have significant confusion but has been showing significant improvement in overall cognitive functioning.  There were a lot of questions from both the patient as well as her husband about expectations for recovery.  Disposition/Plan:  The patient and her husband both requested that they had an opportunity to meet with me again either later this week or first of next week.  There continues to be some worry and anxiety about progress and what to expect going forward.  We will continue to work on these adjustment and coping issues.  Diagnosis:            Electronically Signed   _______________________ Ilean Skill, Psy.D.

## 2017-09-14 NOTE — Progress Notes (Signed)
Social Work Patient ID: Destiny Day, female   DOB: Mar 24, 1951, 67 y.o.   MRN: [id number]   Met with pt and spouse yesterday to talk through [facility] and DME arrangements I am taking care of.  Pt wishes to resume HH with [facility].  Both feeling they will be ready for d/c on Thursday, however, both insistent that they would like to be able to be seen by both Vascular and Neurosurgeon prior to d/c - MD and PA aware.  Hartlee Amedee, LCSW

## 2017-09-14 NOTE — Progress Notes (Signed)
[place] PHYSICAL MEDICINE & REHABILITATION     PROGRESS NOTE  Subjective/Complaints:  Patient seen lying in bed this morning. She states she slept well overnight. She now says she is agreeable to having her staples removed. She still states that she would like to speak with neurosurgery.  ROS: Denies nausea, vomiting, diarrhea, shortness of breath or chest pain   Objective: Vital Signs: Blood pressure (!) 151/60, pulse 85, temperature 98.5 F (36.9 C), temperature source Oral, resp. rate 14, weight 67 kg (147 lb 11.3 oz), SpO2 99 %. No results found. Recent Labs    09/13/17 1446  WBC 11.7*  HGB 9.6*  HCT 31.2*  PLT 243   Recent Labs    09/13/17 1446  NA 130*  K 4.7  CL 93*  GLUCOSE 170*  BUN 44*  CREATININE 6.07*  CALCIUM 9.2   CBG (last 3)  Recent Labs    09/13/17 1926 09/13/17 2118 09/14/17 0651  GLUCAP 92 231* 153*    Wt Readings from Last 3 Encounters:  09/14/17 67 kg (147 lb 11.3 oz)  08/30/17 69 kg (152 lb 1.9 oz)  08/23/17 69.2 kg (152 lb 8.9 oz)    Physical Exam:  BP (!) 151/60 (BP Location: Left Leg)   Pulse 85   Temp 98.5 F (36.9 C) (Oral)   Resp 14   Wt 67 kg (147 lb 11.3 oz)   SpO2 99%   BMI 24.58 kg/m  Constitutional: She appears well-developed and well-nourished. No distress.  HENT: Staples c/d/i Eyes: EOM are normal. No discharge.  Cardiovascular: RRR. No JVD . Respiratory: CTA Bilaterally. Normal effort  GI: Bowel sounds are normal. She exhibits no distension.  Musculoskeletal:  Right great toe amputation  Left transmetatarsal amp  Neurological: She is alert.  Speech clear.  Able to follow simple motor commands without difficulty. A&O.  Motor: RUE: 4/5 proximal to distal RLE: 3+-4-/5 HF, KE, ADF/PF (improving) Skin: Skin is warm and dry. She is not diaphoretic.  Cranial incision clean and intact with staples Right great toe amputation site dusky  Left transmetatarsal site well healed.  Right groin with dressing  c/d/i Psychiatric: Normal mood, normal behavior.  Assessment/Plan: 1. 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 Physiatrist is providing close team supervision and 24 hour management of active medical problems listed below. Physiatrist and rehab team continue to assess barriers to discharge/monitor patient progress toward functional and medical goals.  Function:  Bathing Bathing position   Position: Wheelchair/chair at sink  Bathing parts Body parts bathed by patient: Right arm, Left arm, Chest, Abdomen, Right upper leg, Left upper leg Body parts bathed by helper: Buttocks  Bathing assist Assist Level: Touching or steadying assistance(Pt > 75%)      Upper Body Dressing/Undressing Upper body dressing   What is the patient wearing?: Pull over shirt/dress     Pull over shirt/dress - Perfomed by patient: Thread/unthread right sleeve, Thread/unthread left sleeve, Pull shirt over trunk, Put head through opening Pull over shirt/dress - Perfomed by helper: Put head through opening        Upper body assist Assist Level: Touching or steadying assistance(Pt > 75%)      Lower Body Dressing/Undressing Lower body dressing   What is the patient wearing?: Shoes, [employer], Non-skid slipper socks Underwear - Performed by patient: Pull underwear up/down, Thread/unthread left underwear leg Underwear - Performed by helper: Thread/unthread right  underwear leg Pants- Performed by patient: Thread/unthread left pants leg, Pull pants up/down Pants- Performed by helper: Thread/unthread right pants leg Non-skid slipper socks- Performed by patient: Don/doff right sock, Don/doff left sock Non-skid slipper socks- Performed by helper: Don/doff right sock, Don/doff left sock Socks - Performed by patient: Don/doff left sock Socks - Performed by helper: Don/doff right sock Shoes -  Performed by patient: Don/doff left shoe Shoes - Performed by helper: Don/doff right shoe, Fasten right, Fasten left     TED Hose - Performed by patient: Don/doff left TED hose TED Hose - Performed by helper: Don/doff right TED hose, Don/doff left TED hose  Lower body assist Assist for lower body dressing: (Mod asist)      Toileting Toileting Toileting activity did not occur: No continent bowel/bladder event Toileting steps completed by patient: Adjust clothing prior to toileting, Performs perineal hygiene, Adjust clothing after toileting Toileting steps completed by helper: Adjust clothing after toileting Toileting Assistive Devices: Grab bar or rail  Toileting assist Assist level: Supervision or verbal cues   Transfers Chair/bed transfer   Chair/bed transfer method: Stand pivot Chair/bed transfer assist level: Supervision or verbal cues Chair/bed transfer assistive device: Armrests, Medical sales representative Ambulation activity did not occur: Safety/medical concerns   Max distance: 100 ft Assist level: Supervision or verbal cues   Wheelchair   Type: Manual Max wheelchair distance: 70 Assist Level: Touching or steadying assistance (Pt > 75%)  Cognition Comprehension Comprehension assist level: Follows complex conversation/direction with extra time/assistive device  Expression Expression assist level: Expresses complex 90% of the time/cues < 10% of the time  Social Interaction Social Interaction assist level: Interacts appropriately with others with medication or extra time (anti-anxiety, antidepressant).  Problem Solving Problem solving assist level: Solves complex 90% of the time/cues < 10% of the time  Memory Memory assist level: Recognizes or recalls 90% of the time/requires cueing < 10% of the time    Medical Problem List and Plan: 1.  Generalized weakness, decreased balance as well as issues with delayed processing secondary to TBI with left SDH with evacuation  on 1/30.   Cont CIR   Repeat head CT reviewed, slightly improved.  No changes in management per Neurosurg, repeat CT again on 2/10 reviewed, showing stable/improving bleed.   Will speak with Neurosurg regarding patient request, awaiting visit 2.  DVT Prophylaxis/Anticoagulation: Mechanical: Sequential compression devices, below knee Bilateral lower extremities 3. Pain Management: Limit use of hydrocodone as confusion reported.  4. Mood: LCSW to follow for evaluation and support.  5. Neuropsych: This patient is not capable of making decisions on her own behalf. 6. Skin/Wound Care: Monitor wound for healing.     Remove staples today 7. Fluids/Electrolytes/Nutrition: Monitor I/O. Renal diet with 1200 FR.  8. ESRD: HD MWF at end of the day to help with tolerance of therapy 9. PAD/Right femoral to popliteal bypass: Wet to dry dressing to right groin wound bid.  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   10 T2DM: Monitor BS ac/hs. Continue to use SSI for elevated BS.    Added nephro with meals.    Levemir 5 units at bedtime started on 2/11, increased to 7 units on 2/15 (10 units PTA)   Labile on 2/19, adjust with trend  11. HTN: Monitor BP bid   Hydralazine increased to 50 on 2/10   Labile on 2/19, adjust with trend   Continue catapres, increased to 0.2mg  TID on 09/11/2017  12. Neuropathy RLE: On  gabapentin.  13. Acute on chronic anemia: On Nulecit IV with HD for iron loading and aranesp weekly.    Hb 9.2 on 2/15   Cont to monitor   Labs with HD 14. COPD: Encourage IS with flutter valve. Continue Dulera.  15. Seizure prophylaxis: On keppra tid. 16. Leukocytosis:    WBCs 11.7 on 2/18   Labs with HD   Cont to monitor   LOS (Days) 15 A FACE TO FACE EVALUATION WAS PERFORMED  Destiny Day 09/14/2017 9:31 AM

## 2017-09-14 NOTE — Progress Notes (Signed)
Occupational Therapy Session Note  Patient Details  Name: Destiny Day MRN: [id number] Date of Birth: 13-Dec-1950  Today's Date: 09/14/2017 OT Individual Time: 1856-3149 OT Individual Time Calculation (min): 60 min    Short Term Goals: Week 2:  OT Short Term Goal 1 (Week 2): STG = LTGs due to remaining LOS  Skilled Therapeutic Interventions/Progress Updates:    Treatment session with focus on adaptive techniques to increase independence with LB dressing.  Pt received in bed, completed bed mobility and stand pivot transfer to w/c with RW with supervision.  Engaged in bathing and dressing at sit > stand level at sink, setup to obtain items initially progressing to supervision with sit > stand for perineal hygiene.  Pt doffed socks and washed feet in figure 4 position.  Therapist donned TEDS for time management.  Provided pt with shoe button for Rt shoe (only 1 button available) and demonstrated technique to increase independence with fastening shoes.  Pt attempted donning shoes in figure 4 position, will most likely also benefit from shoe horn as Rt shoe difficult to don due to Rt weakness vs swelling.  Left upright in w/c with chair alarm and quick release belt while completing grooming tasks.  Therapy Documentation Precautions:  Precautions Precautions: Fall Required Braces or Orthoses: Other Brace/Splint Other Brace/Splint: Per rehab notes, pt had [place] shoe for Rt LE -pt stated she did not feel safe in Darco Restrictions Weight Bearing Restrictions: Yes RLE Weight Bearing: Weight bearing as tolerated General:   Vital Signs: Therapy Vitals Temp: 98.5 F (36.9 C) Temp Source: Oral Pulse Rate: 85 Resp: 14 BP: (!) 138/92 Patient Position (if appropriate): Lying Oxygen Therapy SpO2: 99 % O2 Device: Not Delivered Pain:   Pt with no c/o pain  See Function Navigator for Current Functional Status.   Therapy/Group: Individual Therapy  Simonne Come 09/14/2017, 9:54 AM

## 2017-09-14 NOTE — Progress Notes (Signed)
Physical Therapy Note  Patient Details  Name: Destiny Day MRN: [id number] Date of Birth: 1950-09-18 Today's Date: 09/14/2017    Time: 1102-1155 53 minutes  1:1 no c/o pain. Session focused on pt/family education with pt's husband.  Pt/husband educated on and then demo'd stair negotiation with 1 handrail and min A for 4 stairs x 2.  Gait with RW 100' x 2 with supervision. Furniture transfers with occasional min A due to fatigue and low surfaces. Educated pt/husband on energy conservation and safe home set up.  Both express understanding.  W/c mobility with supervision in controlled environment with bilat UEs.  Pt left in room with alarm on, needs at hand, husband present.   DONAWERTH,KAREN 09/14/2017, 12:56 PM

## 2017-09-14 NOTE — Progress Notes (Signed)
Staples removed per order. Patient tolerated procedure. Skin intact, no bleeding or drainage noted.

## 2017-09-15 ENCOUNTER — Inpatient Hospital Stay (HOSPITAL_COMMUNITY): Payer: Medicare Other | Admitting: Occupational Therapy

## 2017-09-15 ENCOUNTER — Inpatient Hospital Stay (HOSPITAL_COMMUNITY): Payer: Medicare Other | Admitting: Speech Pathology

## 2017-09-15 ENCOUNTER — Inpatient Hospital Stay (HOSPITAL_COMMUNITY): Payer: Medicare Other | Admitting: Physical Therapy

## 2017-09-15 ENCOUNTER — Encounter (HOSPITAL_COMMUNITY): Payer: Medicare Other | Admitting: Psychology

## 2017-09-15 LAB — GLUCOSE, CAPILLARY
Glucose-Capillary: 135 mg/dL — ABNORMAL HIGH (ref 65–99)
Glucose-Capillary: 143 mg/dL — ABNORMAL HIGH (ref 65–99)
Glucose-Capillary: 121 mg/dL — ABNORMAL HIGH (ref 65–99)

## 2017-09-15 NOTE — Progress Notes (Signed)
Physical Therapy Discharge Summary  Patient Details  Name: Destiny Day MRN: [id number] Date of Birth: 18-Feb-1951  Today's Date: 09/15/2017 PT Individual Time: 0930-1025 PT Individual Time Calculation (min): 55 min   Pt agreeable to therapy with no c/o pain.  W/c mobility x 100' x 2 with bilat UEs for strength and activity tolerance.  Gait 100' x 2 in controlled environment with supervision, obstacle course negotiation with RW with min A, ramp/curb negotiation with RW with min A.  Stair negotiation 4 stairs with 1 handrail with min A.  Simulated car transfer with supervision.  Pt provided with elevating leg rests to reduce Rt LE swelling.  Pt left in room with alarm on, quick release belt donned,needs at hand.  Patient has met 10 of 10 long term goals due to improved activity tolerance, improved balance, increased strength and ability to compensate for deficits.  Patient to discharge at an ambulatory level Supervision.   Patient's care partner is independent to provide the necessary physical and cognitive assistance at discharge.  Reasons goals not met: n/a  Recommendation:  Patient will benefit from ongoing skilled PT services in home health setting to continue to advance safe functional mobility, address ongoing impairments in strength, balance, gait, activity tolerance, and minimize fall risk.  Equipment: w/c  Reasons for discharge: treatment goals met and discharge from hospital  Patient/family agrees with progress made and goals achieved: Yes  PT Discharge Precautions/Restrictions Precautions Precautions: Fall Restrictions Weight Bearing Restrictions: Yes RLE Weight Bearing: Weight bearing as tolerated Pain Pain Assessment Pain Assessment: No/denies pain  Cognition Overall Cognitive Status: Impaired/Different from baseline Arousal/Alertness: Awake/alert Orientation Level: Oriented X4 Attention: Selective Selective Attention: Appears intact Memory: Impaired Memory  Impairment: Decreased recall of new information Awareness: Impaired Problem Solving: Impaired Problem Solving Impairment: Functional complex Safety/Judgment: Impaired Comments: delayed processing Sensation Sensation Light Touch: Impaired Detail Light Touch Impaired Details: Impaired LLE;Impaired LUE;Impaired RUE;Impaired RLE(neuropathy) Stereognosis: Appears Intact Hot/Cold: Appears Intact Proprioception: Appears Intact Additional Comments: L foot neuropathy Coordination Gross Motor Movements are Fluid and Coordinated: No Fine Motor Movements are Fluid and Coordinated: No Coordination and Movement Description: on RUE coordination is slow and delayed with slight rigidity when reaching arm out Finger Nose Finger Test: delayed - pt continues to demonstrate difficulty completing one finger to nose repetition Motor  Motor Motor: Hemiplegia Motor - Discharge Observations: Rt sided weakness   Trunk/Postural Assessment  Cervical Assessment Cervical Assessment: Within Functional Limits Thoracic Assessment Thoracic Assessment: Within Functional Limits Lumbar Assessment Lumbar Assessment: Within Functional Limits Postural Control Postural Control: (delayed balance reactions)  Balance Static Sitting Balance Static Sitting - Level of Assistance: 6: Modified independent (Device/Increase time) Static Standing Balance Static Standing - Level of Assistance: 5: Stand by assistance Dynamic Standing Balance Dynamic Standing - Level of Assistance: 5: Stand by assistance Extremity Assessment  RUE Assessment RUE Assessment: Exceptions to WFL(limited shoulder, elbow, and finger extension due to OA, strength grossly 4/5) LUE Assessment LUE Assessment: Exceptions to WFL(limited ROM due to OA, strength grossly 4/5) RLE Strength RLE Overall Strength Comments: grossly 3-/5 LLE Assessment LLE Assessment: (grossly 4/5)   See Function Navigator for Current Functional  Status.  Ronnisha Felber 09/15/2017, 10:25 AM

## 2017-09-15 NOTE — Progress Notes (Signed)
[place] PHYSICAL MEDICINE & REHABILITATION     PROGRESS NOTE  Subjective/Complaints:  Pt seen lying in bed this AM.  She states she is still waiting to speak with Neurosurg.   ROS: Denies nausea, vomiting, diarrhea, shortness of breath or chest pain   Objective: Vital Signs: Blood pressure (!) 80/42, pulse 86, temperature 98.5 F (36.9 C), temperature source Oral, resp. rate 18, weight 67 kg (147 lb 11.3 oz), SpO2 100 %. No results found. Recent Labs    09/13/17 1446  WBC 11.7*  HGB 9.6*  HCT 31.2*  PLT 243   Recent Labs    09/13/17 1446  NA 130*  K 4.7  CL 93*  GLUCOSE 170*  BUN 44*  CREATININE 6.07*  CALCIUM 9.2   CBG (last 3)  Recent Labs    09/14/17 2102 09/15/17 0639 09/15/17 1158  GLUCAP 127* 135* 143*    Wt Readings from Last 3 Encounters:  09/14/17 67 kg (147 lb 11.3 oz)  08/30/17 69 kg (152 lb 1.9 oz)  08/23/17 69.2 kg (152 lb 8.9 oz)    Physical Exam:  BP (!) 80/42 Comment: LLE   Pulse 86   Temp 98.5 F (36.9 C) (Oral)   Resp 18   Wt 67 kg (147 lb 11.3 oz)   SpO2 100%   BMI 24.58 kg/m  Constitutional: She appears well-developed and well-nourished. No distress.  HENT: Staples incision c/d/i. Eyes: EOM are normal. No discharge.  Cardiovascular: RRR. No JVD . Respiratory: CTA Bilaterally. Normal effort  GI: Bowel sounds are normal. She exhibits no distension.  Musculoskeletal:  Right great toe amputation  Left transmetatarsal amp  Neurological: She is alert.  Speech clear.  Able to follow simple motor commands without difficulty. A&O.  Motor: RUE: 4/5 proximal to distal RLE: 3+-4-/5 HF, KE, ADF/PF (stable) Skin: Skin is warm and dry. She is not diaphoretic.   Cranial incision clean c/d/i Right great toe amputation site dusky  Left transmetatarsal site well healed.  Right groin with dressing c/d/i, not examined today Psychiatric: Normal mood, normal behavior.  Assessment/Plan: 1. 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 Physiatrist is providing close team supervision and 24 hour management of active medical problems listed below. Physiatrist and rehab team continue to assess barriers to discharge/monitor patient progress toward functional and medical goals.  Function:  Bathing Bathing position   Position: Wheelchair/chair at sink  Bathing parts Body parts bathed by patient: Right arm, Left arm, Chest, Abdomen, Right upper leg, Left upper leg, Front perineal area, Buttocks, Right lower leg, Left lower leg Body parts bathed by helper: Buttocks  Bathing assist Assist Level: Supervision or verbal cues, Set up      Upper Body Dressing/Undressing Upper body dressing   What is the patient wearing?: Pull over shirt/dress     Pull over shirt/dress - Perfomed by patient: Thread/unthread right sleeve, Thread/unthread left sleeve, Pull shirt over trunk, Put head through opening Pull over shirt/dress - Perfomed by helper: Put head through opening        Upper body assist Assist Level: Set up      Lower Body Dressing/Undressing Lower body dressing   What is the patient wearing?: Underwear, Pants, Non-skid slipper socks, Shoes, [employer]  - Performed by patient: Thread/unthread right underwear leg, Thread/unthread left underwear leg, Pull underwear up/down Underwear - Performed by helper: Thread/unthread right underwear leg Pants- Performed  by patient: Thread/unthread right pants leg, Thread/unthread left pants leg, Pull pants up/down Pants- Performed by helper: Thread/unthread right pants leg Non-skid slipper socks- Performed by patient: Don/doff right sock, Don/doff left sock Non-skid slipper socks- Performed by helper: Don/doff right sock, Don/doff left sock Socks - Performed by patient: Don/doff left sock Socks - Performed by helper: Don/doff right sock Shoes - Performed by  patient: Don/doff right shoe, Don/doff left shoe, Fasten right, Fasten left Shoes - Performed by helper: Don/doff right shoe, Fasten right, Fasten left     TED Hose - Performed by patient: Don/doff left TED hose TED Hose - Performed by helper: Don/doff right TED hose, Don/doff left TED hose  Lower body assist Assist for lower body dressing: Set up, Supervision or verbal cues   Set up : Don/doff TED stockings  Toileting Toileting Toileting activity did not occur: No continent bowel/bladder event Toileting steps completed by patient: Adjust clothing prior to toileting, Performs perineal hygiene, Adjust clothing after toileting Toileting steps completed by helper: Adjust clothing after toileting Toileting Assistive Devices: Grab bar or rail  Toileting assist Assist level: Supervision or verbal cues   Transfers Chair/bed transfer   Chair/bed transfer method: Stand pivot Chair/bed transfer assist level: Supervision or verbal cues Chair/bed transfer assistive device: Armrests, Medical sales representative Ambulation activity did not occur: Safety/medical concerns   Max distance: 100 ft Assist level: Supervision or verbal cues   Wheelchair   Type: Manual Max wheelchair distance: 100 Assist Level: Supervision or verbal cues  Cognition Comprehension Comprehension assist level: Follows complex conversation/direction with extra time/assistive device  Expression Expression assist level: Expresses complex 90% of the time/cues < 10% of the time  Social Interaction Social Interaction assist level: Interacts appropriately with others with medication or extra time (anti-anxiety, antidepressant).  Problem Solving Problem solving assist level: Solves basic 90% of the time/requires cueing < 10% of the time  Memory Memory assist level: More than reasonable amount of time    Medical Problem List and Plan: 1.  Generalized weakness, decreased balance as well as issues with delayed processing  secondary to TBI with left SDH with evacuation on 1/30.   Cont CIR   Repeat head CT reviewed, slightly improved.  No changes in management per Neurosurg, repeat CT again on 2/10 reviewed, showing stable/improving bleed.   Awaiting Neurosurg follow up for patient 2.  DVT Prophylaxis/Anticoagulation: Mechanical: Sequential compression devices, below knee Bilateral lower extremities 3. Pain Management: Limit use of hydrocodone as confusion reported.  4. Mood: LCSW to follow for evaluation and support.  5. Neuropsych: This patient is not capable of making decisions on her own behalf. 6. Skin/Wound Care: Monitor wound for healing.     Removed staples  7. Fluids/Electrolytes/Nutrition: Monitor I/O. Renal diet with 1200 FR.  8. ESRD: HD MWF at end of the day to help with tolerance of therapy 9. PAD/Right femoral to popliteal bypass: Wet to dry dressing to right groin wound bid.  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   10 T2DM: Monitor BS ac/hs. Continue to use SSI for elevated BS.    Added nephro with meals.    Levemir 5 units at bedtime started on 2/11, increased to 7 units on 2/15 (10 units PTA)   Labile on 2/20, adjust with trend  11. HTN: Monitor BP bid   Hydralazine increased to 50 on 2/10   Labile on 2/20, adjust with trend   Continue catapres, increased to 0.2mg  TID on 09/11/2017  12.  Neuropathy RLE: On gabapentin.  13. Acute on chronic anemia: On Nulecit IV with HD for iron loading and aranesp weekly.    Hb 9.6 on 2/18   Cont to monitor   Labs with HD 14. COPD: Encourage IS with flutter valve. Continue Dulera.  15. Seizure prophylaxis: On keppra tid. 16. Leukocytosis:    WBCs 11.7 on 2/18   Labs with HD   Cont to monitor   LOS (Days) 16 A FACE TO FACE EVALUATION WAS PERFORMED  Najmah Carradine Lorie Phenix 09/15/2017 12:54 PM

## 2017-09-15 NOTE — Progress Notes (Signed)
Occupational Therapy Discharge Summary  Patient Details  Name: Destiny Day MRN: [id number] Date of Birth: June 27, 1951  Patient has met 8 of 9 long term goals due to improved activity tolerance, improved balance, postural control, ability to compensate for deficits, functional use of  RIGHT upper extremity and improved awareness.  Patient to discharge at overall Supervision level.  Patient's care partner is independent to provide the necessary assistance at discharge.  Patient's husband did not attend an OT session, but has been present during PT and has demonstrated ability to provide physical assist/supervision as needed.  Reasons goals not met: Pt continues to require increased time and/or setup to open containers secondary to RUE weakness and OA.  Recommendation:  Patient will benefit from ongoing skilled OT services in home health setting to continue to advance functional skills in the area of BADL and Reduce care partner burden.  Equipment: 3 in 1  Reasons for discharge: treatment goals met and discharge from hospital  Patient/family agrees with progress made and goals achieved: Yes  OT Discharge Precautions/Restrictions  Precautions Precautions: Fall Restrictions Weight Bearing Restrictions: Yes RLE Weight Bearing: Weight bearing as tolerated General   Vital Signs Therapy Vitals BP: (!) 80/42(LLE ) Pain Pain Assessment Pain Assessment: No/denies pain ADL ADL ADL Comments: refer to functional navigator Vision Baseline Vision/History: Wears glasses Wears Glasses: At all times Patient Visual Report: No change from baseline Vision Assessment?: No apparent visual deficits Perception  Perception: Within Functional Limits Praxis Praxis: Intact Cognition Overall Cognitive Status: Impaired/Different from baseline Arousal/Alertness: Awake/alert Orientation Level: Oriented X4 Attention: Selective Selective Attention: Appears intact Memory: Impaired Memory  Impairment: Decreased recall of new information Problem Solving: Impaired Problem Solving Impairment: Functional complex Safety/Judgment: Appears intact Sensation Sensation Light Touch: Impaired Detail Light Touch Impaired Details: Impaired LLE;Impaired LUE;Impaired RUE(neuropathy, intermittent tingling in fingers) Stereognosis: Appears Intact Hot/Cold: Appears Intact Proprioception: Appears Intact Coordination Gross Motor Movements are Fluid and Coordinated: No Fine Motor Movements are Fluid and Coordinated: No Coordination and Movement Description: on RUE coordination is slow and delayed with slight rigidity when reaching arm out Finger Nose Finger Test: delayed - pt continues to demonstrate difficulty completing one finger to nose repetition Extremity/Trunk Assessment RUE Assessment RUE Assessment: Exceptions to WFL(limited shoulder, elbow, and finger extension due to OA, strength grossly 4/5) LUE Assessment LUE Assessment: Exceptions to WFL(limited ROM due to OA, strength grossly 4/5)   See Function Navigator for Current Functional Status.  Simonne Come 09/15/2017, 9:07 AM

## 2017-09-15 NOTE — Progress Notes (Signed)
[facility] ROUNDING NOTE   Subjective:    End stage renal disease who was recovering from R fem pop when she developed  a Sub dural hematoma after falling in CIR 1/28  and craniotomy was perfprmed emergently . She is getting MWF HD.  No /co today.     Objective:  Vital signs in last 24 hours:  Temp:  [98.5 F (36.9 C)] 98.5 F (36.9 C) (02/20 1300) Pulse Rate:  [79-86] 85 (02/20 1300) Resp:  [16-18] 18 (02/20 1300) BP: (80-120)/(42-91) 108/52 (02/20 1300) SpO2:  [99 %-100 %] 100 % (02/20 1300)  Weight change:  Filed Weights   09/13/17 1400 09/13/17 1820 09/14/17 0542  Weight: 64.8 kg (142 lb 13.7 oz) 63.1 kg (139 lb 1.8 oz) 67 kg (147 lb 11.3 oz)    Intake/Output: I/O last 3 completed shifts: In: 720 [P.O.:720] Out: -    Intake/Output this shift:  Total I/O In: 480 [P.O.:480] Out: -   CVS- RRR RS- CTA ABD- BS present soft non-distended EXT- no edema   Basic Metabolic Panel: Recent Labs  Lab 09/10/17 1400 09/13/17 1446  NA 131* 130*  K 3.6 4.7  CL 92* 93*  CO2 27 25  GLUCOSE 162* 170*  BUN 46* 44*  CREATININE 5.24* 6.07*  CALCIUM 9.0 9.2  PHOS 1.5* 1.2*    Liver Function Tests: Recent Labs  Lab 09/10/17 1400 09/13/17 1446  ALBUMIN 2.5* 2.5*   No results for input(s): LIPASE, AMYLASE in the last 168 hours. No results for input(s): AMMONIA in the last 168 hours.  CBC: Recent Labs  Lab 09/10/17 1400 09/13/17 1446  WBC 10.3 11.7*  HGB 9.2* 9.6*  HCT 29.5* 31.2*  MCV 102.4* 103.7*  PLT 230 243    Cardiac Enzymes: No results for input(s): CKTOTAL, CKMB, CKMBINDEX, TROPONINI in the last 168 hours.  BNP: Invalid input(s): POCBNP  CBG: Recent Labs  Lab 09/14/17 1203 09/14/17 1650 09/14/17 2102 09/15/17 0639 09/15/17 1158  GLUCAP 230* 127* 127* 135* 143*    Microbiology: Results for orders placed or performed during the hospital encounter of 08/23/17  Culture, Urine     Status: None   Collection Time: 08/24/17  10:51 AM  Result Value Ref Range Status   Specimen Description URINE, CATHETERIZED  Final   Special Requests NONE  Final   Culture NO GROWTH  Final   Report Status 08/25/2017 FINAL  Final  MRSA PCR Screening     Status: None   Collection Time: 08/27/17  2:49 AM  Result Value Ref Range Status   MRSA by PCR NEGATIVE NEGATIVE Final    Comment:        The GeneXpert MRSA Assay (FDA approved for NASAL specimens only), is one component of a comprehensive MRSA colonization surveillance program. It is not intended to diagnose MRSA infection nor to guide or monitor treatment for MRSA infections.     Coagulation Studies: No results for input(s): LABPROT, INR in the last 72 hours.  Urinalysis: No results for input(s): COLORURINE, LABSPEC, PHURINE, GLUCOSEU, HGBUR, BILIRUBINUR, KETONESUR, PROTEINUR, UROBILINOGEN, NITRITE, LEUKOCYTESUR in the last 72 hours.  Invalid input(s): APPERANCEUR    Imaging: No results found.   Medications:   . sodium chloride    . sodium chloride     . cloNIDine  0.2 mg Oral TID  . darbepoetin (ARANESP) injection - DIALYSIS  150 mcg Intravenous Q Fri-HD  . feeding supplement (PRO-STAT SUGAR FREE 64)  30 mL Oral BID  . gabapentin  100  mg Oral QHS  . hydrALAZINE  50 mg Oral 2 times per day on Sun Tue Thu Sat  . hydrocerin   Topical BID  . insulin aspart  0-5 Units Subcutaneous QHS  . insulin aspart  0-9 Units Subcutaneous TID WC  . insulin detemir  7 Units Subcutaneous QHS  . mouth rinse  15 mL Mouth Rinse BID  . mometasone-formoterol  2 puff Inhalation BID  . pantoprazole  40 mg Oral Daily  . polyethylene glycol  17 g Oral Daily  . sucroferric oxyhydroxide  1,000 mg Oral TID WC   sodium chloride, sodium chloride, acetaminophen, albuterol, alteplase, aluminum hydroxide, bisacodyl, cloNIDine, diphenhydrAMINE, fluticasone, guaiFENesin-dextromethorphan, heparin, HYDROcodone-acetaminophen, lidocaine (PF), lidocaine-prilocaine, naLOXone (NARCAN)   injection, pentafluoroprop-tetrafluoroeth, polyethylene glycol, prochlorperazine **OR** prochlorperazine **OR** prochlorperazine, simethicone, sodium phosphate, traZODone  Assessment/ Plan:  Dialysis: [facility] 4h  66.5kg  3K/2.25 bath  LUA AVG  P4  No heparin (holding x 8 wks, can resume heparin 10/21/17) Venofer 50mg  IV qwk - last 1/11 Mircera 48mcg IV q2wks - last 1/09       Impression: 1  PAD sp R fem-pop 2  SDH sp craniotomy, rehab 3  ESRD HD mwf 4  Anemia ckd, darbe 150 q Fri, fe load in prog 125mg  x 3  5  HTN stable 6  Vol at dry wt 7  DM on SSI 8  CKD MBD, resumed velphoro  P - HD today , upstairs, lower edw as tol   Kelly Splinter MD [employer] pgr [phone]   09/15/2017, 4:26 PM

## 2017-09-15 NOTE — Progress Notes (Signed)
Speech Language Pathology Discharge Summary  Patient Details  Name: Destiny Day MRN: [id number] Date of Birth: 1951-05-31  Today's Date: 09/15/2017 SLP Individual Time: 6979-4801 SLP Individual Time Calculation (min): 32 min   Skilled Therapeutic Interventions:  Pt was seen for skilled ST targeting grad day activities prior to discharge tomorrow.  SLP administered remaining portions of the CLQT with pt scoring well below normal limits on almost all subtests.  Pt was surprised by task difficulty, stating that she anticipated doing better than she performed today.  SLP discussed further ST follow up at next level of care to maximize cognitive function.  Pt was in agreement with recommendations. Pt was left in wheelchair with quick release belt donned and call bell within reach.    Pt is ready for discharge tomorrow.   Patient has met 4 of 5 long term goals.  Patient to discharge at overall Supervision level.  Reasons goals not met:     Clinical Impression/Discharge Summary:   Pt has made functional gains while inpatient and is discharging having met 4 out of 5 short term goals.  Pt is currently supervision for semi-complex tasks due to mild cognitive deficits specifically related to recall of new information, problem solving, and emergent awarenes and is consuming dys 3, thin liquids diet with mod I use of swallowing precautions.  Pt remains on mechanical soft diet primarily for comfort due to poor condition of dentition but may eat whatever textures are comfortable to her. Pt education is complete at this time.  Pt has demonstrated improved use of memory compensatory strategies and anticipatory awareness of deficits.  Pt is discharging home with recommendations for ST follow up at next level of care to continue to address cognitive function in order to maximize functional independence.    Care Partner:  Caregiver Able to Provide Assistance: Yes  Type of Caregiver Assistance:  Physical;Cognitive  Recommendation:  Home Health SLP;Outpatient SLP;24 hour supervision/assistance  Rationale for SLP Follow Up: Reduce caregiver burden;Maximize cognitive function and independence   Equipment: none recommended by SLP    Reasons for discharge: Discharged from hospital   Patient/Family Agrees with Progress Made and Goals Achieved: Yes   Function:  Eating Eating         Cognition Comprehension Comprehension assist level: Follows complex conversation/direction with extra time/assistive device  Expression   Expression assist level: Expresses complex 90% of the time/cues < 10% of the time  Social Interaction Social Interaction assist level: Interacts appropriately with others with medication or extra time (anti-anxiety, antidepressant).  Problem Solving Problem solving assist level: Solves basic 90% of the time/requires cueing < 10% of the time  Memory Memory assist level: More than reasonable amount of time   Emilio Math 09/15/2017, 12:19 PM

## 2017-09-15 NOTE — Consult Note (Signed)
Neuropsychological Consultation   Patient:   Destiny Day   DOB:   [birth year]  MR Number:  [id number]  Location:  [facility] [id number] [place] [address] Dept: [place]: [phone]           Date of Service:   09/15/2017  Start Time:   8:30 AM End Time:   9:30 PM  Provider/Observer:  Ilean Skill, Psy.D.       Clinical Neuropsychologist       Billing Code/Service: [phone] 4 Units  Chief Complaint:    Destiny Day is a 67 year old female with a history of end-stage renal disease, congestive heart failure, diabetes, hypertension, and amputation in February 2018.  The patient was admitted on 08/11/2017 with ischemic right foot with gangrenous changes and underwent right femoral below the knee BG and right great toe amputation.  The patient had been medically stable but then on 08/23/2017 sustained a fall.  With immediate CT was performed showing a left subdural hematoma.  There was a decline in mental status and she was taken to the OR for emergency surgery including left craniotomy the same day by Dr. Christella Noa.  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 History taken from chart review and husband. She was admitted on 08/11/17 with ischemic right foot with gangrenous changes and underwent right femoral to below knee BG and right great toe amputation by Dr. Donnetta Hutching. Post op WBAT and was admitted to rehab 1/21/2019for inpatient therapies to consist of PT and OT at least three hours five days a week.   She was medically stable and right great toe amputation site as well as RLE bypass incisions were healing well. Reactive leucocytosis was resolving and ABLA was stable. She was progressing to modified independent level  but on 08/23/17 she sustained a fall. CT ordered and reviewed, showing left SDH.  Per report, large left SDH.  She had a decline in MS. She was taken to OR emergently for left craniotomy the same day by Dr. Christella Noa. She developed recurrent acute SDH requiring surgical evacuation on 01/30. hospital course significant for labile BP requiring Cardene drip, hyponatremia as well as VDRF. She tolerated extubation on 01/31 and mentation improving with occasional bouts of confusion. Dr. Donnetta Hutching following for input and right groin wound noted to have fat necrosis which was debrided at bedside today with recommendations for wet to dry dressing changes. Therapy evaluations done and CIR recommended due to generalized weakness, decreased balance as well as issues with delayed processing.   Current Status:  The patient reports continued improvement in cognitive functioning.  She reports being happy about pending discharge tomorrow.  She has many questions she would like to ask Dr. Christella Noa if possible.  She also has questions for Dr. Donnetta Hutching.  We were able to address issues/concerns she has about her neuropsychological changes and expectations going forward.  No indication of significant concussion during fall itself and the patient remembers events around the fall.  Mood is good with some anxiety.    Behavioral Observation: Destiny Day  presents as a 67 y.o.-year-old Right African American Female who appeared her stated age. her dress was Appropriate and she was Well Groomed and her manners were Appropriate to the situation.  her participation was indicative of Appropriate behaviors.  There  were any physical disabilities noted.  she displayed an appropriate level of cooperation and motivation.     Interactions:    Active Redirectable  Attention:   abnormal and attention span appeared shorter than expected for age  Memory:   abnormal; remote memory intact, recent memory impaired  Visuo-spatial:  not  examined  Speech (Volume):  low  Speech:   normal;   Thought Process:  Coherent  Though Content:  WNL; not suicidal and not homicidal  Orientation:   person, place and situation  Judgment:   Fair  Planning:   Fair  Affect:    Depressed  Mood:    Dysphoric  Insight:   Fair  Intelligence:   normal   Medical History:   Past Medical History:  Diagnosis Date  . Abdominal bruit   . Anemia   . Anxiety   . Arthritis    Osteoarthritis  . Asthma   . Cervical disc disease    "pinced nerve"  . CHF (congestive heart failure) ([place])   . Complication of anesthesia    " after I got home from my last procedure, I started itching."  . Diabetes mellitus    Type II  . Diverticulitis   . ESRD on peritoneal dialysis [facility])    Hemodialysis - MWF- [place]   . GERD (gastroesophageal reflux disease)    from medications  . GI bleed 03/31/2013  . History of hiatal hernia   . Hyperlipidemia   . Hypertension   . Neuropathy    left leg  . Osteoporosis   . Peripheral vascular disease ([place])   . Pneumonia    "very young"  . Seasonal allergies   . Shortness of breath dyspnea    WIth exertion  . Sleep apnea    can't afford cpap    Psychiatric History:  Prior history of anxiety  Family Med/Psych History:  Family History  Problem Relation Age of Onset  . Other Mother        not sure of cause of death  . Diabetes Father   . Pancreatic cancer Maternal Grandmother   . Colon cancer Neg Hx     Risk of Suicide/Violence: virtually non-existent   Impression/DX:  Destiny Day is a 67 year old female with a history of end-stage renal disease, congestive heart failure, diabetes, hypertension, and amputation in February 2018.  The patient was admitted on 08/11/2017 with ischemic right foot with gangrenous changes and underwent right femoral below the knee BG and right great toe amputation.  The patient had been medically stable but then on 08/23/2017 sustained a fall.  With immediate CT  was performed showing a left subdural hematoma.  There was a decline in mental status and she was taken to the OR for emergency surgery including left craniotomy the same day by Dr. Christella Noa.  The patient reports continued improvement in cognitive functioning.  She reports being happy about pending discharge tomorrow.  She has many questions she would like to ask Dr. Christella Noa if possible.  She also has questions for Dr. Donnetta Hutching.  We were able to address issues/concerns she has about her neuropsychological changes and expectations going forward.  No indication of significant concussion during fall itself and the patient remembers events around the fall.  Mood is good with some anxiety.     Disposition/Plan:  The patient is being discharged tomorrow to home.  Mental status has significantly improved.  Will be available for outpatient followup if needed.  Diagnosis:  Electronically Signed   _______________________ Ilean Skill, Psy.D.

## 2017-09-15 NOTE — Progress Notes (Addendum)
Pt A/O, no noted distress. Held Catapress result in V/S, see flowsheet. Protective foam administered to right heel. Educated pt on medications and discharge plans. Wound nurse visited with patient to reassess skin. Staff will continue to monitor and meet needs. Adjusted 1800  Medication to 2000 result in pt in dialysis.

## 2017-09-15 NOTE — Progress Notes (Signed)
Occupational Therapy Session Note  Patient Details  Name: Destiny Day MRN: [id number] Date of Birth: [birth year]  Today's Date: 09/15/2017 OT Individual Time: 6387-5643 OT Individual Time Calculation (min): 62 min    Short Term Goals: Week 2:  OT Short Term Goal 1 (Week 2): STG = LTGs due to remaining LOS  Skilled Therapeutic Interventions/Progress Updates:    Completed ADL retraining at overall Supervision/setup level.  Pt demonstrating improved ability to complete LB bathing/dressing at sit > stand level with use of figure 4 position to increase success with donning/doffing socks and shoes.  Pt requires use of shoe horn to don Rt shoe and increased time to utilize shoe buttons to fasten shoes with intermittent assist on Rt shoe due to decreased strength in Rt hand.  Pt reports pleased with progress and adaptive techniques provided to increase independence.  Pt left upright in w/c with neuropsychologist arriving.  Therapy Documentation Precautions:  Precautions Precautions: Fall Required Braces or Orthoses: Other Brace/Splint Other Brace/Splint: Per rehab notes, pt had Darco shoe for Rt LE -pt stated she did not feel safe in Darco Restrictions Weight Bearing Restrictions: Yes RLE Weight Bearing: Weight bearing as tolerated General:   Vital Signs: Therapy Vitals BP: (!) 80/42(LLE ) Pain:  Pt with no c/o pain  See Function Navigator for Current Functional Status.   Therapy/Group: Individual Therapy  Simonne Come 09/15/2017, 9:02 AM

## 2017-09-16 ENCOUNTER — Telehealth: Payer: Self-pay | Admitting: Vascular Surgery

## 2017-09-16 LAB — GLUCOSE, CAPILLARY
Glucose-Capillary: 123 mg/dL — ABNORMAL HIGH (ref 65–99)
Glucose-Capillary: 195 mg/dL — ABNORMAL HIGH (ref 65–99)

## 2017-09-16 MED ORDER — HYDROCERIN EX CREA: 228.0000 "application " | TOPICAL_CREAM | Freq: Two times a day (BID) | CUTANEOUS | 0 refills | Status: DC

## 2017-09-16 MED ORDER — PRO-STAT SUGAR FREE PO LIQD: 30.0000 mL | Freq: Two times a day (BID) | ORAL | 0 refills | Status: DC

## 2017-09-16 MED ORDER — DAKINS 0.4-0.5 % EX SOLN: Freq: Two times a day (BID) | CUTANEOUS | 0 refills | Status: DC

## 2017-09-16 MED ORDER — LEVEMIR FLEXTOUCH 100 UNIT/ML ~~LOC~~ SOPN: 7.0000 [IU] | PEN_INJECTOR | Freq: Every day | SUBCUTANEOUS | 11 refills | Status: DC

## 2017-09-16 MED ORDER — HYDRALAZINE HCL 25 MG PO TABS: ORAL_TABLET | ORAL | Status: DC

## 2017-09-16 MED ORDER — HYDRALAZINE HCL 50 MG PO TABS: ORAL_TABLET | ORAL | 0 refills | Status: DC

## 2017-09-16 MED ORDER — CIPROFLOXACIN HCL 250 MG PO TABS
250.0000 mg | ORAL_TABLET | Freq: Two times a day (BID) | ORAL | Status: DC
  Administered 2017-09-16: 250 mg via ORAL
  Filled 2017-09-16: qty 1

## 2017-09-16 MED ORDER — POLYETHYLENE GLYCOL 3350 17 G PO PACK: 17.0000 g | PACK | Freq: Every day | ORAL | 0 refills | Status: DC

## 2017-09-16 MED ORDER — CIPROFLOXACIN HCL 250 MG PO TABS: 250.0000 mg | ORAL_TABLET | Freq: Two times a day (BID) | ORAL | 0 refills | Status: DC

## 2017-09-16 MED ORDER — DAKINS 0.4-0.5 % EX SOLN: Freq: Every day | CUTANEOUS | 0 refills | Status: DC

## 2017-09-16 MED ORDER — CLONIDINE HCL 0.2 MG PO TABS: 0.2000 mg | ORAL_TABLET | Freq: Three times a day (TID) | ORAL | 0 refills | Status: DC

## 2017-09-16 NOTE — Telephone Encounter (Signed)
Sched appt 10/05/17 at 1:15. Spoke to pt to inform them of appt.

## 2017-09-16 NOTE — Discharge Summary (Signed)
Physician Discharge Summary  Patient ID: Destiny Day MRN: [id number] DOB/AGE: [birth year] 67 y.o.  Admit date: 08/30/2017 Discharge date: 09/16/2017  Discharge Diagnoses:  Principal Problem:   Subdural hemorrhage following injury [facility]) Active Problems:   Type 2 diabetes mellitus with diabetic nephropathy (HCC)   ESRD on dialysis ([place])   Right sided weakness   Essential hypertension   Anemia of infection and chronic disease   Discharged Condition:  Stable   Significant Diagnostic Studies: Ct Head Wo Contrast  Result Date: 09/05/2017 CLINICAL DATA:  Status post craniotomy.  Right facial droop. EXAM: CT HEAD WITHOUT CONTRAST TECHNIQUE: Contiguous axial images were obtained from the base of the skull through the vertex without intravenous contrast. COMPARISON:  CT head without contrast 2/6/nineteen. 08/24/2017. 08/23/2017. FINDINGS: Brain: Left extra-axial hemorrhage is stable to slightly decreased following craniotomy. No new hemorrhage is present. Maximal width on coronal images is 8 mm. Residual midline shift of 2 mm is stable. Ventricle size is stable. No acute cortical infarct is present. The basal ganglia are intact. Insular ribbon is normal. Brainstem and cerebellum are normal. Vascular: Calcifications are present within the cavernous internal carotid arteries bilaterally. There is no hyperdense vessel. Skull: Calvarium is intact apart from the craniotomy. A low-density collection remains within the scalp adjacent to the craniotomy. There is still some air within this collection. Sinuses/Orbits: Mild mucosal thickening is present in the sphenoid sinuses bilaterally. The paranasal sinuses are otherwise clear. There is some fluid in the inferior right mastoid air cells. No obstructing nasopharyngeal lesion is present. Globes and orbits are within normal limits. IMPRESSION: 1. Stable to slight decrease in size of left extra-axial hemorrhage following craniotomy. 2. No new hemorrhage. 3.  Stable 2 mm midline shift. 4. No acute infarct. 5. Atherosclerosis. 6. Mild sphenoid sinus disease. Electronically Signed   By: San Morelle M.D.   On: 09/05/2017 19:01   Ct Head Wo Contrast  Result Date: 09/01/2017 CLINICAL DATA:  Ataxia, suspect stroke. Recent craniotomy 08/24/2017 for subdural hematoma EXAM: CT HEAD WITHOUT CONTRAST TECHNIQUE: Contiguous axial images were obtained from the base of the skull through the vertex without intravenous contrast. COMPARISON:  CT 08/24/2017 FINDINGS: Brain: Left-sided craniotomy for subdural evacuation. Fluid and gas remains in the scalp soft tissues. Left frontal subdural hematoma has improved now measuring 8 mm in thickness. Majority of this retains high density. Bifrontal pneumocephalus has resolved. 2 mm midline shift to the right has improved. No new area of hemorrhage since the prior study Ventricle size normal for age. Mild atrophy. No acute infarct or mass. Vascular: Negative for hyperdense vessel Skull: Recent left-sided craniotomy.  No acute skeletal abnormality. Sinuses/Orbits: Paranasal sinuses clear.  Normal orbit. Other: None IMPRESSION: Improving left-sided subdural hematoma following recent craniotomy. Subdural hemorrhage retains high density and measures 8 mm in thickness. Slight midline shift to the right 2 mm has improved. Pneumocephalus has improved. Negative for acute infarct. Electronically Signed   By: Franchot Gallo M.D.   On: 09/01/2017 12:25    Labs:  Basic Metabolic Panel: Recent Labs  Lab 09/10/17 1400 09/13/17 1446  NA 131* 130*  K 3.6 4.7  CL 92* 93*  CO2 27 25  GLUCOSE 162* 170*  BUN 46* 44*  CREATININE 5.24* 6.07*  CALCIUM 9.0 9.2  PHOS 1.5* 1.2*    CBC: CBC Latest Ref Rng & Units 09/13/2017 09/10/2017 09/08/2017  WBC 4.0 - 10.5 K/uL 11.7(H) 10.3 11.7(H)  Hemoglobin 12.0 - 15.0 g/dL 9.6(L) 9.2(L) 10.0(L)  Hematocrit 36.0 - 46.0 %  31.2(L) 29.5(L) 32.9(L)  Platelets 150 - 400 K/uL 243 230 267     CBG: Recent Labs  Lab 09/15/17 0639 09/15/17 1158 09/15/17 2129 09/16/17 0649 09/16/17 1138  GLUCAP 135* 143* 121* 123* 195*    Brief HPI:   [name] McAdoois a 67 y.o.femalewith history of ESRD- HD, diastolic congestive heart failure, diabetes mellitus, HTN, PVD s/p 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 She was admitted on 08/11/17 with ischemic right foot with gangrenous changes and underwent right femoral to below knee BG and right great toe amputation by Dr. Donnetta Hutching. She admitted to rehab 1/21/2019for inpatient therapies to consist of PT and OT at least three hours five days a week and was making great progress towards independent level. Day prior to discharge on 01/29, she fell and struck her head with decrease in LOC due to development of large left SDH.  She was taken to OR emergently for left craniotomy by Dr. Christella Noa. She developed recurrent acute SDH requiring surgical evacuation on 01/30.   Hospital course significant for labile BP requiring Cardene drip, hyponatremia as well as VDRF. She tolerated extubation on 01/31 and mentation was improving with occasional bouts of confusion. Dr. Donnetta Hutching was  following for monitoring of right groin wound and areas of fat necrosis was debrided at bedside  with recommendations for wet to dry dressing changes. Therapy evaluations done and CIR recommended due to generalized weakness, decreased balance as well as issues with cognitive issues.    Hospital Course: Destiny Day was admitted to rehab 08/30/2017 for inpatient therapies to consist of PT, ST and OT at least three hours five days a week. Past admission physiatrist, therapy team and rehab RN have worked together to provide customized collaborative inpatient rehab. Blood pressures were monitored on bid basis and were noted to be labile. Hydralazine was titrated up to 50 mg bid on non dialysis days and catapres was added for tighter control. Diabetes has been monitored with  ac/hs cbg checks and Levemir was resumed and titrated up to 8 units. She will continue to monitor BS frequently at home and follow up with primary MD for input on further adjustment after discharge.  Neuropathy RLE has been managed with scheduled low dose gabapentin. HD has been ongoing on MWF at the end of the day.  Anemia of chronic disease has been stable.  She did have an episode of dark stool but stool guaiacs X 2 were negative.   Vascular has been following for input on right groin wound. Santyl was used initially but she developed som greenish drainage on dressing therefore this was changed to Dakin's solution. She has been afebrile during her stay but has had recurrent rise in WBC. She was started on cipro for wound prophylaxis at discharge and is to continue this for a week. Vascular will contact her with follow up appointment.  Right great toe amputation site is intact without drainage and is healing well. She dose have 1+ edema from mid shin to foot and TEDs as well as elevation was advised to help with edema control.  She was noted to have some increase in weakness RUE on 2/6 and CT head was stable.  Head CT was repeated on  2/10 due to concerns of increased lethargy overnight and this showed some decrease in bleed. She has bere done showing improvement in activity as well as mentation. She will continue to receive follow up HHPT, [place] and [place] by [facility].    Rehab course: During patient's  stay in rehab weekly team conferences were held to monitor patient's progress, set goals and discuss barriers to discharge. At admission, patient required Max assist with mobility and basic self care tasks.  She exhibited moderate to severe cognitive impairments  with delayed processing and decreased awareness of deficits. MoCA score 17/30 on testing.  She  has had improvement in activity tolerance, balance, postural control as well as ability to compensate for deficits. She has had improvement in  functional use RUE  and RLE as well as improvement in awareness. She is able to complete ADL tasks with supervision.  She is able to perform transfers with supervision and is ambulating 100'X 2 with RW in controlled environment. She requires min assist to navigate obstacles and for ramp/curb negotiation.  She is currently able to complete semi-complex tasks with supervision, is showing improvement in use of memory strategies and is tolerating dysphagia 3 det without difficulty. Family education was completed with husband regarding all aspects of care.     Disposition: 01-Home or Self Care  Diet: Renal diet. 1200 cc FR/day  Special Instructions: 1. Wear support stocking on RLE and elevate it when seated.  2. Wound Care:  Cleanse right groin with dakins solution and cover with damp to dry dressing.     Allergies as of 09/16/2017      Reactions   Lisinopril Cough   Prednisone Other (See Comments)   Excessive fluid buildup   Velphoro [sucroferric Oxyhydroxide] Nausea And Vomiting   Nausea and vomiting      Medication List    STOP taking these medications   amLODipine 10 MG tablet Commonly known as:  NORVASC   aspirin 81 MG chewable tablet   AURYXIA 1 GM 210 MG(Fe) tablet Generic drug:  ferric citrate   calcium acetate 667 MG capsule Commonly known as:  PHOSLO   carvedilol 12.5 MG tablet Commonly known as:  COREG   cinacalcet 30 MG tablet Commonly known as:  SENSIPAR   HUMALOG 100 UNIT/ML injection Generic drug:  insulin lispro   isosorbide dinitrate 20 MG tablet Commonly known as:  ISORDIL     TAKE these medications   acetaminophen 500 MG tablet Commonly known as:  TYLENOL Take 500 mg by mouth every 6 (six) hours as needed.   albuterol 108 (90 Base) MCG/ACT inhaler Commonly known as:  PROVENTIL HFA;VENTOLIN HFA Inhale 1-2 puffs into the lungs every 6 (six) hours as needed for wheezing or shortness of breath.   atorvastatin 20 MG tablet Commonly known as:   LIPITOR Take 20 mg by mouth at bedtime.   budesonide-formoterol 160-4.5 MCG/ACT inhaler Commonly known as:  SYMBICORT Inhale 1 puff into the lungs daily as needed (for respiratory issues).   ciprofloxacin 250 MG tablet Commonly known as:  CIPRO Take 1 tablet (250 mg total) by mouth 2 (two) times daily.   cloNIDine 0.2 MG tablet Commonly known as:  CATAPRES Take 1 tablet (0.2 mg total) by mouth 3 (three) times daily.   feeding supplement (PRO-STAT SUGAR FREE 64) Liqd Take 30 mLs by mouth 2 (two) times daily.   fluticasone 50 MCG/ACT nasal spray Commonly known as:  FLONASE Place 1 spray into both nostrils daily as needed for allergies or rhinitis.   gabapentin 100 MG capsule Commonly known as:  NEURONTIN Take 1 capsule (100 mg total) by mouth at bedtime. When necessary for neuropathy pain   hydrALAZINE 50 MG tablet Commonly known as:  APRESOLINE Take 1 tab 2 times a day on non  dialysis days. What changed:    medication strength  how much to take  how to take this  when to take this  additional instructions   hydrocerin Crea Apply 945 application topically 2 (two) times daily.   HYDROcodone-acetaminophen 5-325 MG tablet Commonly known as:  NORCO Take 1 tablet by mouth every 6 (six) hours as needed for moderate pain.   LEVEMIR FLEXTOUCH 100 UNIT/ML Pen Generic drug:  Insulin Detemir Inject 7 Units into the skin at bedtime. What changed:  how much to take   loratadine 10 MG tablet Commonly known as:  CLARITIN Take 10 mg by mouth daily as needed for allergies.   multivitamin Tabs tablet Take 1 tablet by mouth daily.   omeprazole 20 MG capsule Commonly known as:  PRILOSEC Take 20 mg by mouth daily.   polyethylene glycol packet Commonly known as:  MIRALAX / GLYCOLAX Take 17 g by mouth daily. Start taking on:  09/17/2017   sodium hypochlorite external solution Apply topically daily. Cleanse groin and cover with damp gauze soaked in dakins.   VELPHORO 500  MG chewable tablet Generic drug:  sucroferric oxyhydroxide Chew 500-1,000 mg by mouth See admin instructions. Take 2 tablets (1000 mg) by mouth with meals and 1 tablet (500 mg) with snacks      Follow-up Information    Jamse Arn, MD Follow up.   Specialty:  Physical Medicine and Rehabilitation Why:  Office will call you with follow up appointment Contact information: [address] [place] [address] [phone]        Ashok Pall, MD Follow up.   Specialty:  Neurosurgery Why:  for post op check Contact information: [address] [place] [address] [place] [address] [phone]        Rosetta Posner, MD Follow up in 2 week(s).   Specialties:  Vascular Surgery, Cardiology Why:  for follow up appointment Contact information: [place] [address] [phone]        Velna Hatchet, MD Follow up.   Specialty:  Internal Medicine Contact information: [address] [place] [address] [phone]           Signed: Bary Leriche 09/16/2017, 6:40 PM

## 2017-09-16 NOTE — Progress Notes (Signed)
Social Work  Discharge Note  The overall goal for the admission was met for:   Discharge location: Yes - home with spouse who will provide 24/7 assistance.  Length of Stay: Yes - 17 days  Discharge activity level: Yes - supervision to min assist overall  Home/community participation: Yes  Services provided included: MD, RD, PT, OT, SLP, RN, TR, Pharmacy, [place]: Medicare and Private Insurance: [place]  Follow-up services arranged: Home Health: RN, PT, OT, ST via [facility], DME: 18x18 lightweight w/c with ELRs, cushion, 3n1 commode via [place] and Patient/Family has no preference for HH/DME agencies  Comments (or additional information):  Patient/Family verbalized understanding of follow-up arrangements: Yes  Individual responsible for coordination of the follow-up plan: pt  Confirmed correct DME delivered: Nonnie Pickney 09/16/2017    Heidy Mccubbin

## 2017-09-16 NOTE — Progress Notes (Addendum)
Pt A/O, no noted distress. Denies pain. Educated pt on medication and dressing change. Family member demonstrated the dressing change correctly. Groin wound noted yellow slug, cleaned, dakins on gauze, placed in wound with dry gauze, and secured with tegaderm. Mastered the hand hygiene.Right heel has protective foam in place. Sacrum with protective foam. PA educated as well. Staff will continue to monitor and safely transport to car. Pt has received all medical supplies.

## 2017-09-16 NOTE — Discharge Instructions (Signed)
Inpatient Rehab Discharge Instructions  Destiny Day Discharge date and time:  09/16/17  Activities/Precautions/ Functional Status: Activity: no lifting, driving, or strenuous exercise till cleared by MD Diet: renal diet Wound Care:  Wash with soap and water.Cleanse right groin with dakins solution and cover with damp to dry dressing -do this 1-2 times a day to reduce greenish drainage.  --Keep areas clean and dry. Contact MD if you develop any problems with your incision/wound--redness, swelling, increase in pain, drainage or if you develop fever or chills.    Functional status:  ___ No restrictions     ___ Walk up steps independently _X__ 24/7 supervision/assistance   ___ Walk up steps with assistance ___ Intermittent supervision/assistance  ___ Bathe/dress independently ___ Walk with walker     ___ Bathe/dress with assistance ___ Walk Independently    ___ Shower independently ___ Walk with assistance    _X__ Shower with assistance _X__ No alcohol     ___ Return to work/school ________   COMMUNITY REFERRALS UPON DISCHARGE:    Home Health:   PT     OT     ST    RN                     Agency:  [facility]   Phone: [phone]   Medical Equipment/Items Ordered:  Wheelchair, cushion, commode                                                      Agency/Supplier:  [facility] @ [phone]  Special Instructions: 1. No driving or strenuous activity.    My questions have been answered and I understand these instructions. I will adhere to these goals and the provided educational materials after my discharge from the hospital.  Patient/Caregiver Signature _______________________________ Date __________  Clinician Signature _______________________________________ Date __________  Please bring this form and your medication list with you to all your follow-up doctor's appointments.

## 2017-09-16 NOTE — Patient Care Conference (Signed)
Inpatient RehabilitationTeam Conference and Plan of Care Update Date: 09/15/2017   Time: 2:40 PM    Patient Name: Destiny Day      Medical Record Number: [id number]  Date of Birth: 11-Nov-1950 Sex: Female         Room/Bed: 4W05C/4W05C-01 Payor Info: Payor: MEDICARE / Plan: MEDICARE PART A AND B / Product Type: *No Product type* /    Admitting Diagnosis: SDH  Admit Date/Time:  08/30/2017  6:06 PM Admission Comments: No comment available   Primary Diagnosis:  <principal problem not specified> Principal Problem: <principal problem not specified>  Patient Active Problem List   Diagnosis Date Noted  . Labile blood glucose   . Anemia of infection and chronic disease   . Essential hypertension   . Black stool   . Right sided weakness   . Subdural hemorrhage following injury ([place]) 08/30/2017  . Diabetic peripheral neuropathy ([place])   . Chronic obstructive pulmonary disease ([place])   . Seizure prophylaxis   . Subdural hematoma ([place]) 08/24/2017  . Traumatic subdural hematoma ([place]) 08/23/2017  . Fall   . SDH (subdural hematoma) ([place])   . Acute respiratory failure with hypoxia ([place])   . Diabetes mellitus type 2 in nonobese (HCC)   . Leukocytosis   . Labile blood pressure   . Generalized anxiety disorder   . Debility 08/16/2017  . Amputated great toe of right foot ([place])   . Amputated toe of left foot ([place])   . Post-operative pain   . ESRD on dialysis ([place])   . Diabetes mellitus ([place])   . Anxiety state   . Benign essential HTN   . Acute blood loss anemia   . Anemia of chronic disease   . Idiopathic chronic venous hypertension of both lower extremities with inflammation 10/13/2016  . S/P transmetatarsal amputation of foot, left ([place]) 09/21/2016  . Onychomycosis 08/15/2016  . Gangrene of toe of left foot ([place])   . PAD (peripheral artery disease) ([place]) 07/08/2016  . Atherosclerosis of native artery of left lower extremity with gangrene ([place]) 06/23/2016  . GERD (gastroesophageal  reflux disease) 10/07/2015  . ARF (acute renal failure) ([place])   . Hypothermia 06/12/2015  . Acute on chronic renal failure ([place]) 06/12/2015  . CHF (congestive heart failure) ([place]) 07/30/2014  . CKD (chronic kidney disease) stage 4, GFR 15-29 ml/min (HCC) 06/27/2014  . Hypertensive heart disease 05/25/2013  . CKD (chronic kidney disease) stage 3, GFR 30-59 ml/min (HCC) 05/25/2013  . Pulmonary edema 05/23/2013  . Type 2 diabetes mellitus with diabetic nephropathy ([place]) 05/23/2013  . Type 2 diabetes mellitus with hyperosmolar nonketotic hyperglycemia ([place]) 04/13/2013  . Chronic diastolic CHF (congestive heart failure) ([place]) 04/13/2013  . UGI bleed 03/31/2013  . Hypokalemia 08/24/2012  . Type II or unspecified type diabetes mellitus without mention of complication, not stated as uncontrolled 12/27/2007  . HLD (hyperlipidemia) 12/27/2007  . ALLERGIC RHINITIS 12/27/2007  . Asthma 12/27/2007    Expected Discharge Date: Expected Discharge Date: 09/16/17  Team Members Present: Physician leading conference: Dr. Delice Lesch Social Worker Present: Lennart Pall, LCSW Nurse Present: Frances Maywood, RN PT Present: Roderic Ovens, PT OT Present: Simonne Come, OT SLP Present: Windell Moulding, SLP PPS Coordinator present : Daiva Nakayama, RN, CRRN     Current Status/Progress Goal Weekly Team Focus  Medical   Generalized weakness, decreased balance as well as issues with delayed processing secondary to TBI with left SDH with evacuation on 1/30.  Improve mobility, DM, safety  See above  Bowel/Bladder   incont bowel; oliguric; hemodialysis; lbm 09/11/17  HD treatment; mod assist  monitor BM's for frequency of q 2-3 days; administer PRNs if needed   Swallow/Nutrition/ Hydration             ADL's   Supervision overall except requires assist with fastening shoes  supervision overall  LB dressing, use of AE to increase independence, dynamic standing balance, pt/family education   Mobility    supervision/min A  min A/supervision  d/c planning   Communication             Safety/Cognition/ Behavioral Observations  supervision- mod I   mod I   education complete, ready for discharge tomorrow    Pain   c/o headache; pain to R groin site during dressing change at times; tylenol  <3  assess pain q shift and PRN   Skin   staples L side of head; R calf incision healing; R groin site open, dressing changes q day; L foot amputated toes; R foot amputated great toe  free from furth breakdown/infection  assess skin q shift; dressing changes per order; dakins wet to dry    Rehab Goals Patient on target to meet rehab goals: Yes *See Care Plan and progress notes for long and short-term goals.     Barriers to Discharge  Current Status/Progress Possible Resolutions Date Resolved   Physician    Medical stability;Hemodialysis     see above  Therapies, optimize DM meds      Nursing                  PT                    OT                  SLP                SW                Discharge Planning/Teaching Needs:  Husband taking FMLA to provide 24/7 assistance for pt at d/c.  TEaching being completed with spouse   Team Discussion:  Pt reaching goals and ready for d/c tomorrow.  No concerns  Revisions to Treatment Plan:  None    Continued Need for Acute Rehabilitation Level of Care: The patient requires daily medical management by a physician with specialized training in physical medicine and rehabilitation for the following conditions: Daily direction of a multidisciplinary physical rehabilitation program to ensure safe treatment while eliciting the highest outcome that is of practical value to the patient.: Yes Daily medical management of patient stability for increased activity during participation in an intensive rehabilitation regime.: Yes Daily analysis of laboratory values and/or radiology reports with any subsequent need for medication adjustment of medical intervention for :  Post surgical problems;Diabetes problems;Blood pressure problems;Neurological problems  Treavon Castilleja 09/16/2017, 11:49 AM

## 2017-09-16 NOTE — Progress Notes (Signed)
Patient returned from dialysis. VSS. Patient denies pain/discomfort. Per report, 2 liters removed. Will continue to monitor.

## 2017-09-16 NOTE — Progress Notes (Signed)
[facility]     PROGRESS NOTE  Subjective/Complaints:  Pt seen lying in bed this AM.  She slept well overnight.  She appears surprised about the fact that she is going home today and then excited.   ROS: Denies nausea, vomiting, diarrhea, shortness of breath or chest pain   Objective: Vital Signs: Blood pressure (!) 143/73, pulse 96, temperature 99.2 F (37.3 C), temperature source Oral, resp. rate 18, weight 65.5 kg (144 lb 6.4 oz), SpO2 100 %. No results found. Recent Labs    09/13/17 1446  WBC 11.7*  HGB 9.6*  HCT 31.2*  PLT 243   Recent Labs    09/13/17 1446  NA 130*  K 4.7  CL 93*  GLUCOSE 170*  BUN 44*  CREATININE 6.07*  CALCIUM 9.2   CBG (last 3)  Recent Labs    09/15/17 1158 09/15/17 2129 09/16/17 0649  GLUCAP 143* 121* 123*    Wt Readings from Last 3 Encounters:  09/16/17 65.5 kg (144 lb 6.4 oz)  08/30/17 69 kg (152 lb 1.9 oz)  08/23/17 69.2 kg (152 lb 8.9 oz)    Physical Exam:  BP (!) 143/73 (BP Location: Right Wrist)   Pulse 96   Temp 99.2 F (37.3 C) (Oral)   Resp 18   Wt 65.5 kg (144 lb 6.4 oz)   SpO2 100%   BMI 24.03 kg/m  Constitutional: She appears well-developed and well-nourished. NAD.  HENT: Incision c/d/i. Eyes: EOM are normal. No discharge.  Cardiovascular: RRR. No JVD . Respiratory: CTA Bilaterally. Normal effort  GI: Bowel sounds are normal. She exhibits no distension.  Musculoskeletal:  Right great toe amputation  Left transmetatarsal amp  Neurological: She is alert.  Speech clear.  Able to follow simple motor commands without difficulty. A&O.  Motor: RUE: 4/5 proximal to distal RLE: 3+-4-/5 HF, KE, ADF/PF (unchanged) Skin: Skin is warm and dry. She is not diaphoretic.   Cranial incision clean c/d/i Right great toe amputation site dusky  Left transmetatarsal site well healed.  Right groin with dressing c/d/i, not examined today Psychiatric: Normal mood, normal  behavior.  Assessment/Plan: 1. 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 Physiatrist is providing close team supervision and 24 hour management of active medical problems listed below. Physiatrist and rehab team continue to assess barriers to discharge/monitor patient progress toward functional and medical goals.  Function:  Bathing Bathing position   Position: Wheelchair/chair at sink  Bathing parts Body parts bathed by patient: Right arm, Left arm, Chest, Abdomen, Right upper leg, Left upper leg, Front perineal area, Buttocks, Right lower leg, Left lower leg Body parts bathed by helper: Buttocks  Bathing assist Assist Level: Supervision or verbal cues, Set up      Upper Body Dressing/Undressing Upper body dressing   What is the patient wearing?: Pull over shirt/dress     Pull over shirt/dress - Perfomed by patient: Thread/unthread right sleeve, Thread/unthread left sleeve, Pull shirt over trunk, Put head through opening Pull over shirt/dress - Perfomed by helper: Put head through opening        Upper body assist Assist Level: Set up      Lower Body Dressing/Undressing Lower body dressing   What is the patient wearing?: Underwear, Pants, Non-skid slipper socks, Shoes, [employer]  - Performed by patient: Thread/unthread right underwear leg, Thread/unthread left underwear leg, Pull underwear up/down Underwear -  Performed by helper: Thread/unthread right underwear leg Pants- Performed by patient: Thread/unthread right pants leg, Thread/unthread left pants leg, Pull pants up/down Pants- Performed by helper: Thread/unthread right pants leg Non-skid slipper socks- Performed by patient: Don/doff right sock, Don/doff left sock Non-skid slipper socks- Performed by helper: Don/doff right sock, Don/doff left sock Socks - Performed by patient: Don/doff  left sock Socks - Performed by helper: Don/doff right sock Shoes - Performed by patient: Don/doff right shoe, Don/doff left shoe, Fasten right, Fasten left Shoes - Performed by helper: Don/doff right shoe, Fasten right, Fasten left     TED Hose - Performed by patient: Don/doff left TED hose TED Hose - Performed by helper: Don/doff right TED hose, Don/doff left TED hose  Lower body assist Assist for lower body dressing: Set up, Supervision or verbal cues   Set up : Don/doff TED stockings  Toileting Toileting Toileting activity did not occur: No continent bowel/bladder event Toileting steps completed by patient: Adjust clothing prior to toileting, Performs perineal hygiene, Adjust clothing after toileting Toileting steps completed by helper: Adjust clothing after toileting Toileting Assistive Devices: Grab bar or rail  Toileting assist Assist level: Supervision or verbal cues   Transfers Chair/bed transfer   Chair/bed transfer method: Stand pivot Chair/bed transfer assist level: Supervision or verbal cues Chair/bed transfer assistive device: Armrests, Medical sales representative Ambulation activity did not occur: Safety/medical concerns   Max distance: 100 ft Assist level: Supervision or verbal cues   Wheelchair   Type: Manual Max wheelchair distance: 100 Assist Level: Supervision or verbal cues  Cognition Comprehension Comprehension assist level: Follows complex conversation/direction with extra time/assistive device  Expression Expression assist level: Expresses complex 90% of the time/cues < 10% of the time  Social Interaction Social Interaction assist level: Interacts appropriately with others with medication or extra time (anti-anxiety, antidepressant).  Problem Solving Problem solving assist level: Solves basic 90% of the time/requires cueing < 10% of the time  Memory Memory assist level: More than reasonable amount of time    Medical Problem List and Plan: 1.   Generalized weakness, decreased balance as well as issues with delayed processing secondary to TBI with left SDH with evacuation on 1/30.   D/c today    Will see patient for transitional care management in 1-2 weeks   Repeat head CT reviewed, slightly improved.  No changes in management per Neurosurg, repeat CT again on 2/10 reviewed, showing stable/improving bleed.   Cont to await Neurosurg follow up for patient 2.  DVT Prophylaxis/Anticoagulation: Mechanical: Sequential compression devices, below knee Bilateral lower extremities 3. Pain Management: Limit use of hydrocodone as confusion reported.  4. Mood: LCSW to follow for evaluation and support.  5. Neuropsych: This patient is not capable of making decisions on her own behalf. 6. Skin/Wound Care: Monitor wound for healing.     Removed staples  7. Fluids/Electrolytes/Nutrition: Monitor I/O. Renal diet with 1200 FR.  8. ESRD: HD MWF at end of the day to help with tolerance of therapy 9. PAD/Right femoral to popliteal bypass: Wet to dry dressing to right groin wound bid.  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   10 T2DM: Monitor BS ac/hs. Continue to use SSI for elevated BS.    Added nephro with meals.    Levemir 5 units at bedtime started on 2/11, increased to 7 units on 2/15 (10 units PTA)   Relatively controlled on 2/21  11. HTN: Monitor BP bid   Hydralazine increased to 50  on 2/10   Labile on 2/21   Continue catapres, increased to 0.2mg  TID on 09/11/2017  12. Neuropathy RLE: On gabapentin.  13. Acute on chronic anemia: On Nulecit IV with HD for iron loading and aranesp weekly.    Hb 9.6 on 2/18   Cont to monitor   Labs with HD 14. COPD: Encourage IS with flutter valve. Continue Dulera.  15. Seizure prophylaxis: On keppra tid. 16. Leukocytosis:    WBCs 11.7 on 2/18   Labs with HD   Cont to monitor   LOS (Days) 17 A FACE TO FACE EVALUATION WAS PERFORMED  Destiny Day Lorie Phenix 09/16/2017 6:59 AM

## 2017-09-16 NOTE — Progress Notes (Addendum)
[facility]  Subjective  - Doing better.   Objective (!) 143/73 87 99.2 F (37.3 C) (Oral) 18 98%  Intake/Output Summary (Last 24 hours) at 09/16/2017 0757 Last data filed at 09/15/2017 2100 Gross per 24 hour  Intake 720 ml  Output 2003 ml  Net -1283 ml    Right groin with granulating beefy red base, wet to dry dressing with green discoloration at edges.   No visible bypass in wound base. GT amputation site well healed.  Palpable DP pulse  Assessment/Planning: Right fem pop bypass with right great toe amputation Craniotomy x 2 for subdural hematoma drainage  Patent bypass with well healed GT amputation site.  Right groin has good granulation tissue and is healing.    Dressing edges with green discoloration this may indicate Pseudomonas bacteria in the wound.  She may need to be discharged on oral antibiotics to help protect the graft bypass.  Would recommend Cipro 250 mg BID for 7 days.  Take after HD on dialysis days. F/U with Dr. Donnetta Hutching in 2 weeks.  Roxy Horseman 09/16/2017 7:57 AM --  Laboratory Lab Results: Recent Labs    09/13/17 1446  WBC 11.7*  HGB 9.6*  HCT 31.2*  PLT 243   BMET Recent Labs    09/13/17 1446  NA 130*  K 4.7  CL 93*  CO2 25  GLUCOSE 170*  BUN 44*  CREATININE 6.07*  CALCIUM 9.2    COAG Lab Results  Component Value Date   INR 1.02 08/23/2017   INR 1.05 08/11/2017   INR 1.03 07/07/2016   No results found for: PTT

## 2017-09-16 NOTE — Telephone Encounter (Signed)
-----   Message from Mena Goes, RN sent at 09/16/2017 10:37 AM EST ----- Regarding: 2-3 weeks    ----- Message ----- From: Ulyses Amor, PA-C Sent: 09/16/2017   8:04 AM To: [facility] Charge Pool  Patient being discharged today she will need a 2-3 week appt. From now please with DR. Early.  Please cancel any other sooner than this thx [facility]

## 2017-09-16 NOTE — Progress Notes (Signed)
Social Work Patient ID: Destiny Day, female   DOB: 1951-03-29, 67 y.o.   MRN: [id number]     Destiny Day  Social Worker  General Practice  Patient Care Conference  Signed  Date of Service:  09/16/2017 11:49 AM          Signed          [] Hide copied text  [] Hover for details   Inpatient RehabilitationTeam Conference and Plan of Care Update Date: 09/15/2017   Time: 2:40 PM      Patient Name: Destiny Day      Medical Record Number: [id number]  Date of Birth: Apr 24, 1951 Sex: Female         Room/Bed: 4W05C/4W05C-01 Payor Info: Payor: MEDICARE / Plan: MEDICARE PART A AND B / Product Type: *No Product type* /     Admitting Diagnosis: SDH  Admit Date/Time:  08/30/2017  6:06 PM Admission Comments: No comment available    Primary Diagnosis:  <principal problem not specified> Principal Problem: <principal problem not specified>       Patient Active Problem List    Diagnosis Date Noted  . Labile blood glucose    . Anemia of infection and chronic disease    . Essential hypertension    . Black stool    . Right sided weakness    . Subdural hemorrhage following injury ([place]) 08/30/2017  . Diabetic peripheral neuropathy ([place])    . Chronic obstructive pulmonary disease ([place])    . Seizure prophylaxis    . Subdural hematoma ([place]) 08/24/2017  . Traumatic subdural hematoma ([place]) 08/23/2017  . Fall    . SDH (subdural hematoma) ([place])    . Acute respiratory failure with hypoxia ([place])    . Diabetes mellitus type 2 in nonobese (HCC)    . Leukocytosis    . Labile blood pressure    . Generalized anxiety disorder    . Debility 08/16/2017  . Amputated great toe of right foot ([place])    . Amputated toe of left foot ([place])    . Post-operative pain    . ESRD on dialysis ([place])    . Diabetes mellitus ([place])    . Anxiety state    . Benign essential HTN    . Acute blood loss anemia    . Anemia of chronic disease    . Idiopathic chronic venous hypertension of both lower  extremities with inflammation 10/13/2016  . S/P transmetatarsal amputation of foot, left ([place]) 09/21/2016  . Onychomycosis 08/15/2016  . Gangrene of toe of left foot ([place])    . PAD (peripheral artery disease) ([place]) 07/08/2016  . Atherosclerosis of native artery of left lower extremity with gangrene ([place]) 06/23/2016  . GERD (gastroesophageal reflux disease) 10/07/2015  . ARF (acute renal failure) ([place])    . Hypothermia 06/12/2015  . Acute on chronic renal failure ([place]) 06/12/2015  . CHF (congestive heart failure) ([place]) 07/30/2014  . CKD (chronic kidney disease) stage 4, GFR 15-29 ml/min (HCC) 06/27/2014  . Hypertensive heart disease 05/25/2013  . CKD (chronic kidney disease) stage 3, GFR 30-59 ml/min (HCC) 05/25/2013  . Pulmonary edema 05/23/2013  . Type 2 diabetes mellitus with diabetic nephropathy ([place]) 05/23/2013  . Type 2 diabetes mellitus with hyperosmolar nonketotic hyperglycemia ([place]) 04/13/2013  . Chronic diastolic CHF (congestive heart failure) ([place]) 04/13/2013  . UGI bleed 03/31/2013  . Hypokalemia 08/24/2012  . Type II or unspecified type diabetes mellitus without mention of complication, not stated as uncontrolled 12/27/2007  .  HLD (hyperlipidemia) 12/27/2007  . ALLERGIC RHINITIS 12/27/2007  . Asthma 12/27/2007      Expected Discharge Date: Expected Discharge Date: 09/16/17   Team Members Present: Physician leading conference: Dr. 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  Improve mobility, DM, safety  See above   Bowel/Bladder     incont bowel; oliguric; hemodialysis; lbm 09/11/17  HD  treatment; mod assist  monitor BM's for frequency of q 2-3 days; administer PRNs if needed   Swallow/Nutrition/ Hydration               ADL's     Supervision overall except requires assist with fastening shoes  supervision overall  LB dressing, use of AE to increase independence, dynamic standing balance, pt/family education   Mobility     supervision/min A  min A/supervision  d/c planning   Communication               Safety/Cognition/ Behavioral Observations   supervision- mod I   mod I   education complete, ready for discharge tomorrow    Pain     c/o headache; pain to R groin site during dressing change at times; tylenol  <3  assess pain q shift and PRN   Skin     staples L side of head; R calf incision healing; R groin site open, dressing changes q day; L foot amputated toes; R foot amputated great toe  free from furth breakdown/infection  assess skin q shift; dressing changes per order; dakins wet to dry     Rehab Goals Patient on target to meet rehab goals: Yes *See Care Plan and progress notes for long and short-term goals.      Barriers to Discharge   Current Status/Progress Possible Resolutions Date Resolved   Physician     Medical stability;Hemodialysis     see above  Therapies, optimize DM meds      Nursing                 PT                    OT                 SLP            SW              Discharge Planning/Teaching Needs:  Husband taking FMLA to provide 24/7 assistance for pt at d/c.  TEaching being completed with spouse   Team Discussion:  Pt reaching goals and ready for d/c tomorrow.  No concerns  Revisions to Treatment Plan:  None    Continued Need for Acute Rehabilitation Level of Care: The patient requires daily medical management by a physician with specialized training in physical medicine and rehabilitation for the following conditions: Daily direction of a multidisciplinary physical rehabilitation program to ensure safe treatment while  eliciting the highest outcome that is of practical value to the patient.: Yes Daily medical management of patient stability for increased activity during participation in an intensive rehabilitation regime.: Yes Daily analysis of laboratory values  and/or radiology reports with any subsequent need for medication adjustment of medical intervention for : Post surgical problems;Diabetes problems;Blood pressure problems;Neurological problems   Varian Innes 09/16/2017, 11:49 AM                  Lowella Curb, LCSW  Social Worker  General Practice  Patient Care Conference  Signed  Date of Service:  09/02/2017 12:29 PM          Signed          [] Hide copied text  [] Hover for details   Inpatient RehabilitationTeam Conference and Plan of Care Update Date: 09/01/2017   Time: 3:00 PM      Patient Name: Destiny Day      Medical Record Number: [id number]  Date of Birth: 10-Apr-1951 Sex: Female         Room/Bed: 4W05C/4W05C-01 Payor Info: Payor: MEDICARE / Plan: MEDICARE PART A AND B / Product Type: *No Product type* /     Admitting Diagnosis: SDH  Admit Date/Time:  08/30/2017  6:06 PM Admission Comments: No comment available    Primary Diagnosis:  <principal problem not specified> Principal Problem: <principal problem not specified>       Patient Active Problem List    Diagnosis Date Noted  . Black stool    . Right sided weakness    . Subdural hemorrhage following injury ([place]) 08/30/2017  . Diabetic peripheral neuropathy ([place])    . Chronic obstructive pulmonary disease ([place])    . Seizure prophylaxis    . Subdural hematoma ([place]) 08/24/2017  . Traumatic subdural hematoma ([place]) 08/23/2017  . Fall    . SDH (subdural hematoma) ([place])    . Acute respiratory failure with hypoxia ([place])    . Diabetes mellitus type 2 in nonobese (HCC)    . Leukocytosis    . Labile blood pressure    . Generalized anxiety disorder    . Debility 08/16/2017  . Amputated great toe of right foot  ([place])    . Amputated toe of left foot ([place])    . Post-operative pain    . ESRD on dialysis ([place])    . Diabetes mellitus ([place])    . Anxiety state    . Benign essential HTN    . Acute blood loss anemia    . Anemia of chronic disease    . Idiopathic chronic venous hypertension of both lower extremities with inflammation 10/13/2016  . S/P transmetatarsal amputation of foot, left ([place]) 09/21/2016  . Onychomycosis 08/15/2016  . Gangrene of toe of left foot ([place])    . PAD (peripheral artery disease) ([place]) 07/08/2016  . Atherosclerosis of native artery of left lower extremity with gangrene ([place]) 06/23/2016  . GERD (gastroesophageal reflux disease) 10/07/2015  . ARF (acute renal failure) ([place])    . Hypothermia 06/12/2015  . Acute on chronic renal failure ([place]) 06/12/2015  . CHF (congestive heart failure) ([place]) 07/30/2014  . CKD (chronic kidney disease) stage 4, GFR 15-29 ml/min (HCC) 06/27/2014  . Hypertensive heart disease 05/25/2013  . CKD (chronic kidney disease) stage 3, GFR 30-59 ml/min (HCC) 05/25/2013  . Pulmonary edema 05/23/2013  . Type 2 diabetes mellitus with diabetic nephropathy ([place]) 05/23/2013  . Type 2 diabetes mellitus with hyperosmolar nonketotic hyperglycemia ([place]) 04/13/2013  . Chronic diastolic CHF (congestive heart failure) ([place]) 04/13/2013  . UGI bleed 03/31/2013  . Hypokalemia 08/24/2012  . Type II or unspecified type diabetes mellitus without mention of complication, not stated as uncontrolled 12/27/2007  .  HLD (hyperlipidemia) 12/27/2007  . ALLERGIC RHINITIS 12/27/2007  . Asthma 12/27/2007      Expected Discharge Date: Expected Discharge Date: 09/16/17   Team Members Present: Physician leading conference: Dr. 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  Improve mobility, DM/BP, cognition, weakness  See above   Bowel/Bladder     LBM 08/31/2017 Incontinent of bowel and urine ( Oliguria) ESRD -M-W-F  Moderate assist x1,Remain Continent of Bladder/bowel while on Rehab  Assess and monitor bladder/bowel  QS and PRN,admister medication Laxative with in 2-3 days for bowel , Oliguria-Anguia reported and monitor   Swallow/Nutrition/ Hydration               ADL's     max A overall  Supervision overall  ADL training, RUE NMR, activity and standing tolerance, functional mobility, pt education   Mobility     mod A transfers, short distance gait, min A w/c  min A/supervision  activity tolerance, balance, attention, initiation   Communication               Safety/Cognition/ Behavioral Observations   Alert oriented x3 able to verbalize her needs to staff, coopeative. High Risk fall s.p Cranio seconday Fall, no futher reportd falls  Mod assist, no fall while on RH  High Risk for falls, monior at all times while up and educate patient,family and staff on fall prevention goal daily.    Pain     Intermittent c/o head ache with discomfort to surgical area. Administer  meds. with Tylenol and Norco alternating as needed         Skin     Surgical incision intact and healingv well (site open to air)  Prevent onset infection or surgical complications          Rehab Goals Patient on target to meet rehab goals: Yes *See Care Plan and progress notes for long and short-term goals.      Barriers to Discharge   Current Status/Progress Possible Resolutions Date Resolved   Physician     Medical stability;Hemodialysis;Wound Care     See above  Therapies, optimize BP/DM meds, follow labs, repeat head CT      Nursing                 PT  Inaccessible home environment  two level  home              OT Inaccessible home environment  pt's bathroom and bedroom on 2nd floor, she can reside on 1st floor but wants to sleep upstairs             SLP            SW              Discharge Planning/Teaching Needs:  Husband taking FMLA to provide 24/7 assistance for pt at d/c.  Will need teaching with spouse prior to d/c.   Team Discussion:  Returning pt now post-neurosurgery for SDH.  Appears some weaker on right side today - MD to check CT.  Max assist with transfers and only taking a  few steps.  Slow to process and ?internally distracted and request cues.  Mod assist with cognition.  Hoping to reach min assist goals.  Revisions to Treatment Plan:  None    Continued Need for Acute Rehabilitation Level of Care: The patient requires daily medical management by a physician with specialized training in physical medicine and rehabilitation for the following conditions: Daily direction of a multidisciplinary physical rehabilitation program to ensure safe treatment while eliciting the highest outcome that is of practical value to the patient.: Yes Daily medical management of patient stability for increased activity during participation in an intensive rehabilitation regime.: Yes Daily analysis of laboratory values and/or radiology reports with any subsequent need for medication adjustment of medical intervention for : Post surgical problems;Renal problems;Wound care problems;Diabetes problems;Blood pressure problems;Neurological problems   Jowanna Loeffler 09/02/2017, 12:30 PM                  Lowella Curb, LCSW  Social Worker  General Practice  Patient Care Conference  Signed  Date of Service:  09/02/2017 12:29 PM          Signed          [] Hide copied text  [] Hover for details   Inpatient RehabilitationTeam Conference and Plan of Care Update Date: 09/01/2017   Time: 3:00 PM      Patient Name: Destiny Day      Medical Record Number: [id number]  Date of  Birth: [birth year] Sex: Female         Room/Bed: 4W05C/4W05C-01 Payor Info: Payor: MEDICARE / Plan: MEDICARE PART A AND B / Product Type: *No Product type* /     Admitting Diagnosis: SDH  Admit Date/Time:  08/30/2017  6:06 PM Admission Comments: No comment available    Primary Diagnosis:  <principal problem not specified> Principal Problem: <principal problem not specified>       Patient Active Problem List    Diagnosis Date Noted  . Black stool    . Right sided weakness    . Subdural hemorrhage following injury ([place]) 08/30/2017  . Diabetic peripheral neuropathy ([place])    . Chronic obstructive pulmonary disease ([place])    . Seizure prophylaxis    . Subdural hematoma ([place]) 08/24/2017  . Traumatic subdural hematoma ([place]) 08/23/2017  . Fall    . SDH (subdural hematoma) ([place])    . Acute respiratory failure with hypoxia ([place])    . Diabetes mellitus type 2 in nonobese (HCC)    . Leukocytosis    . Labile blood pressure    . Generalized anxiety disorder    . Debility 08/16/2017  . Amputated great toe of right foot ([place])    . Amputated toe of left foot ([place])    . Post-operative pain    . ESRD on dialysis ([place])    . Diabetes mellitus ([place])    . Anxiety state    . Benign essential HTN    . Acute blood loss anemia    . Anemia of chronic disease    . Idiopathic chronic venous hypertension of both lower extremities with inflammation 10/13/2016  . S/P transmetatarsal amputation of foot, left ([place]) 09/21/2016  . Onychomycosis 08/15/2016  . Gangrene of toe of left foot ([place])    . PAD (peripheral artery disease) ([place]) 07/08/2016  . Atherosclerosis of native artery of left lower extremity with gangrene ([place]) 06/23/2016  . GERD (gastroesophageal reflux disease) 10/07/2015  . ARF (acute renal failure) ([place])    . Hypothermia 06/12/2015  .  Acute on chronic renal failure ([place]) 06/12/2015  . CHF (congestive heart failure) ([place]) 07/30/2014  . CKD (chronic kidney disease) stage 4, GFR 15-29 ml/min  (HCC) 06/27/2014  . Hypertensive heart disease 05/25/2013  . CKD (chronic kidney disease) stage 3, GFR 30-59 ml/min (HCC) 05/25/2013  . Pulmonary edema 05/23/2013  . Type 2 diabetes mellitus with diabetic nephropathy ([place]) 05/23/2013  . Type 2 diabetes mellitus with hyperosmolar nonketotic hyperglycemia ([place]) 04/13/2013  . Chronic diastolic CHF (congestive heart failure) ([place]) 04/13/2013  . UGI bleed 03/31/2013  . Hypokalemia 08/24/2012  . Type II or unspecified type diabetes mellitus without mention of complication, not stated as uncontrolled 12/27/2007  . HLD (hyperlipidemia) 12/27/2007  . ALLERGIC RHINITIS 12/27/2007  . Asthma 12/27/2007      Expected Discharge Date: Expected Discharge Date: 09/16/17   Team Members Present: Physician leading conference: Dr. 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  Improve mobility, DM/BP, cognition, weakness  See above   Bowel/Bladder     LBM 08/31/2017 Incontinent of bowel and urine ( Oliguria) ESRD -M-W-F  Moderate assist x1,Remain Continent of Bladder/bowel while on Rehab  Assess and monitor bladder/bowel  QS and PRN,admister medication Laxative with in 2-3 days for bowel , Oliguria-Anguia reported and monitor   Swallow/Nutrition/ Hydration               ADL's     max A overall  Supervision overall  ADL training, RUE NMR, activity and standing tolerance, functional mobility, pt education   Mobility     mod A transfers, short distance gait, min A w/c  min A/supervision  activity tolerance, balance, attention, initiation   Communication                 Safety/Cognition/ Behavioral Observations   Alert oriented x3 able to verbalize her needs to staff, coopeative. High Risk fall s.p Cranio seconday Fall, no futher reportd falls  Mod assist, no fall while on RH  High Risk for falls, monior at all times while up and educate patient,family and staff on fall prevention goal daily.    Pain     Intermittent c/o head ache with discomfort to surgical area. Administer  meds. with Tylenol and Norco alternating as needed         Skin     Surgical incision intact and healingv well (site open to air)  Prevent onset infection or surgical complications          Rehab Goals Patient on target to meet rehab goals: Yes *See Care Plan and progress notes for long and short-term goals.      Barriers to Discharge   Current Status/Progress Possible Resolutions Date Resolved   Physician     Medical stability;Hemodialysis;Wound Care     See above  Therapies, optimize BP/DM meds, follow labs, repeat head CT      Nursing                 PT  Inaccessible home environment  two level home              OT Inaccessible home  environment  pt's bathroom and bedroom on 2nd floor, she can reside on 1st floor but wants to sleep upstairs             SLP            SW              Discharge Planning/Teaching Needs:  Husband taking FMLA to provide 24/7 assistance for pt at d/c.  Will need teaching with spouse prior to d/c.   Team Discussion:  Returning pt now post-neurosurgery for SDH.  Appears some weaker on right side today - MD to check CT.  Max assist with transfers and only taking a few steps.  Slow to process and ?internally distracted and request cues.  Mod assist with cognition.  Hoping to reach min assist goals.  Revisions to Treatment Plan:  None    Continued Need for Acute Rehabilitation Level of Care: The patient requires daily medical management by a physician with specialized training in physical medicine and rehabilitation for the following  conditions: Daily direction of a multidisciplinary physical rehabilitation program to ensure safe treatment while eliciting the highest outcome that is of practical value to the patient.: Yes Daily medical management of patient stability for increased activity during participation in an intensive rehabilitation regime.: Yes Daily analysis of laboratory values and/or radiology reports with any subsequent need for medication adjustment of medical intervention for : Post surgical problems;Renal problems;Wound care problems;Diabetes problems;Blood pressure problems;Neurological problems   Destiny Trickey 09/02/2017, 12:30 PM

## 2017-09-16 NOTE — Progress Notes (Signed)
[facility] ROUNDING NOTE   Subjective:   Up in chair, going home today   Objective:  Vital signs in last 24 hours:  Temp:  [98 F (36.7 C)-99.2 F (37.3 C)] 99.2 F (37.3 C) (02/21 0500) Pulse Rate:  [82-104] 87 (02/21 0745) Resp:  [18-20] 18 (02/21 0745) BP: (112-183)/(50-93) 117/80 (02/21 0904) SpO2:  [98 %-100 %] 98 % (02/21 0745) Weight:  [65 kg (143 lb 4.8 oz)-67 kg (147 lb 11.3 oz)] 65.5 kg (144 lb 6.4 oz) (02/21 0500)  Weight change:  Filed Weights   09/15/17 1546 09/15/17 1900 09/16/17 0500  Weight: 67 kg (147 lb 11.3 oz) 65 kg (143 lb 4.8 oz) 65.5 kg (144 lb 6.4 oz)    Intake/Output: I/O last 3 completed shifts: In: 720 [P.O.:720] Out: 2003 [Other:2000; Stool:3]   Intake/Output this shift:  Total I/O In: 120 [P.O.:120] Out: -   CVS- RRR RS- CTA ABD- BS present soft non-distended EXT- no edema   Basic Metabolic Panel: Recent Labs  Lab 09/10/17 1400 09/13/17 1446  NA 131* 130*  K 3.6 4.7  CL 92* 93*  CO2 27 25  GLUCOSE 162* 170*  BUN 46* 44*  CREATININE 5.24* 6.07*  CALCIUM 9.0 9.2  PHOS 1.5* 1.2*    Liver Function Tests: Recent Labs  Lab 09/10/17 1400 09/13/17 1446  ALBUMIN 2.5* 2.5*   No results for input(s): LIPASE, AMYLASE in the last 168 hours. No results for input(s): AMMONIA in the last 168 hours.  CBC: Recent Labs  Lab 09/10/17 1400 09/13/17 1446  WBC 10.3 11.7*  HGB 9.2* 9.6*  HCT 29.5* 31.2*  MCV 102.4* 103.7*  PLT 230 243    Cardiac Enzymes: No results for input(s): CKTOTAL, CKMB, CKMBINDEX, TROPONINI in the last 168 hours.  BNP: Invalid input(s): POCBNP  CBG: Recent Labs  Lab 09/15/17 0639 09/15/17 1158 09/15/17 2129 09/16/17 0649 09/16/17 1138  GLUCAP 135* 143* 121* 23* 195*    Microbiology: Results for orders placed or performed during the hospital encounter of 08/23/17  Culture, Urine     Status: None   Collection Time: 08/24/17 10:51 AM  Result Value Ref Range Status   Specimen Description URINE, CATHETERIZED  Final   Special Requests NONE  Final   Culture NO GROWTH  Final   Report Status 08/25/2017 FINAL  Final  MRSA PCR Screening     Status: None   Collection Time: 08/27/17  2:49 AM  Result Value Ref Range Status   MRSA by PCR NEGATIVE NEGATIVE Final    Comment:        The GeneXpert MRSA Assay (FDA approved for NASAL specimens only), is one component of a comprehensive MRSA colonization surveillance program. It is not intended to diagnose MRSA infection nor to guide or monitor treatment for MRSA infections.     Coagulation Studies: No results for input(s): LABPROT, INR in the last 72 hours.  Urinalysis: No results for input(s): COLORURINE, LABSPEC, PHURINE, GLUCOSEU, HGBUR, BILIRUBINUR, KETONESUR, PROTEINUR, UROBILINOGEN, NITRITE, LEUKOCYTESUR in the last 72 hours.  Invalid input(s): APPERANCEUR    Imaging: No results found.   Medications:    . ciprofloxacin  250 mg Oral BID  . cloNIDine  0.2 mg Oral TID  . darbepoetin (ARANESP) injection - DIALYSIS  150 mcg Intravenous Q Fri-HD  . feeding supplement (PRO-STAT SUGAR FREE 64)  30 mL Oral BID  . gabapentin  100 mg Oral QHS  . hydrALAZINE  50 mg Oral 2 times per day on Sun Tue  Thu Sat  . hydrocerin   Topical BID  . insulin aspart  0-5 Units Subcutaneous QHS  . insulin aspart  0-9 Units Subcutaneous TID WC  . insulin detemir  7 Units Subcutaneous QHS  . mouth rinse  15 mL Mouth Rinse BID  . mometasone-formoterol  2 puff Inhalation BID  . pantoprazole  40 mg Oral Daily  . polyethylene glycol  17 g Oral Daily  . sucroferric oxyhydroxide  1,000 mg Oral TID WC   acetaminophen, albuterol, aluminum hydroxide, bisacodyl, cloNIDine, diphenhydrAMINE, fluticasone, guaiFENesin-dextromethorphan, HYDROcodone-acetaminophen, naLOXone (NARCAN)  injection, polyethylene glycol, prochlorperazine **OR** prochlorperazine **OR** prochlorperazine, simethicone, sodium phosphate, traZODone  Assessment/  Plan:  Dialysis: [facility]WF [facility] 4h  66.5kg  3K/2.25 bath  LUA AVG  P4  No heparin (holding x 8 wks, can resume heparin 10/21/17) Venofer 50mg  IV qwk - last 1/11 Mircera 46mcg IV q2wks - last 1/09       Impression: 1  PAD sp R fem-pop 2  SDH sp craniotomy, rehab 3  ESRD HD mwf 4  Anemia ckd, darbe 150 q Fri, fe load in prog 125mg  x 3  5  HTN stable 6  Vol at dry wt 7  DM on SSI 8  CKD MBD, resumed velphoro  P - for dc today, will f/u with next HD tomorrow at OP center   [place] pgr [phone]   09/16/2017, 1:00 PM

## 2017-09-17 ENCOUNTER — Inpatient Hospital Stay (HOSPITAL_COMMUNITY): Payer: Medicare Other | Admitting: Physical Therapy

## 2017-09-17 ENCOUNTER — Telehealth: Payer: Self-pay | Admitting: *Deleted

## 2017-09-17 DIAGNOSIS — D631 Anemia in chronic kidney disease: Secondary | ICD-10-CM | POA: Diagnosis not present

## 2017-09-17 DIAGNOSIS — N186 End stage renal disease: Secondary | ICD-10-CM | POA: Diagnosis not present

## 2017-09-17 DIAGNOSIS — E1122 Type 2 diabetes mellitus with diabetic chronic kidney disease: Secondary | ICD-10-CM | POA: Diagnosis not present

## 2017-09-17 DIAGNOSIS — D509 Iron deficiency anemia, unspecified: Secondary | ICD-10-CM | POA: Diagnosis not present

## 2017-09-17 DIAGNOSIS — N2581 Secondary hyperparathyroidism of renal origin: Secondary | ICD-10-CM | POA: Diagnosis not present

## 2017-09-17 DIAGNOSIS — E876 Hypokalemia: Secondary | ICD-10-CM | POA: Diagnosis not present

## 2017-09-17 DIAGNOSIS — Z992 Dependence on renal dialysis: Secondary | ICD-10-CM | POA: Diagnosis not present

## 2017-09-17 NOTE — Telephone Encounter (Addendum)
Transitional Care call-  Spoke with Mrs Gillock but she was at dialysis and phone had interference.  I told her we would call her Monday and see how she is doing and give her the appt to come back and see Dr Patel/Eunice NP   1.  Appointment Wednesday 09/29/17 @ 2:40 arrive by 2:20 to see Destiny Sensing NP [place]

## 2017-09-18 DIAGNOSIS — I2510 Atherosclerotic heart disease of native coronary artery without angina pectoris: Secondary | ICD-10-CM | POA: Diagnosis not present

## 2017-09-18 DIAGNOSIS — I69393 Ataxia following cerebral infarction: Secondary | ICD-10-CM | POA: Diagnosis not present

## 2017-09-18 DIAGNOSIS — T8140XD Infection following a procedure, unspecified, subsequent encounter: Secondary | ICD-10-CM | POA: Diagnosis not present

## 2017-09-18 DIAGNOSIS — S065X0S Traumatic subdural hemorrhage without loss of consciousness, sequela: Secondary | ICD-10-CM | POA: Diagnosis not present

## 2017-09-18 DIAGNOSIS — I1311 Hypertensive heart and chronic kidney disease without heart failure, with stage 5 chronic kidney disease, or end stage renal disease: Secondary | ICD-10-CM | POA: Diagnosis not present

## 2017-09-18 DIAGNOSIS — G8191 Hemiplegia, unspecified affecting right dominant side: Secondary | ICD-10-CM | POA: Diagnosis not present

## 2017-09-20 DIAGNOSIS — D509 Iron deficiency anemia, unspecified: Secondary | ICD-10-CM | POA: Diagnosis not present

## 2017-09-20 DIAGNOSIS — D631 Anemia in chronic kidney disease: Secondary | ICD-10-CM | POA: Diagnosis not present

## 2017-09-20 DIAGNOSIS — E876 Hypokalemia: Secondary | ICD-10-CM | POA: Diagnosis not present

## 2017-09-20 DIAGNOSIS — N186 End stage renal disease: Secondary | ICD-10-CM | POA: Diagnosis not present

## 2017-09-20 DIAGNOSIS — E1122 Type 2 diabetes mellitus with diabetic chronic kidney disease: Secondary | ICD-10-CM | POA: Diagnosis not present

## 2017-09-20 DIAGNOSIS — N2581 Secondary hyperparathyroidism of renal origin: Secondary | ICD-10-CM | POA: Diagnosis not present

## 2017-09-20 NOTE — Telephone Encounter (Signed)
2nd Attempt for Transitional call, left message on cell phone, no one answered home phone.

## 2017-09-21 ENCOUNTER — Telehealth: Payer: Self-pay | Admitting: Registered Nurse

## 2017-09-21 DIAGNOSIS — T8140XD Infection following a procedure, unspecified, subsequent encounter: Secondary | ICD-10-CM | POA: Diagnosis not present

## 2017-09-21 DIAGNOSIS — I69393 Ataxia following cerebral infarction: Secondary | ICD-10-CM | POA: Diagnosis not present

## 2017-09-21 DIAGNOSIS — G8191 Hemiplegia, unspecified affecting right dominant side: Secondary | ICD-10-CM | POA: Diagnosis not present

## 2017-09-21 DIAGNOSIS — S065X0S Traumatic subdural hemorrhage without loss of consciousness, sequela: Secondary | ICD-10-CM | POA: Diagnosis not present

## 2017-09-21 DIAGNOSIS — I1311 Hypertensive heart and chronic kidney disease without heart failure, with stage 5 chronic kidney disease, or end stage renal disease: Secondary | ICD-10-CM | POA: Diagnosis not present

## 2017-09-21 DIAGNOSIS — I2510 Atherosclerotic heart disease of native coronary artery without angina pectoris: Secondary | ICD-10-CM | POA: Diagnosis not present

## 2017-09-21 NOTE — Telephone Encounter (Signed)
Transitional Care call Transitional Care Call Completed, Appointment Confirmed, Address Confirmed, New patient Packet Mailed  Patient name: Destiny Day DOB: 1951-03-19 1. Are you/is patient experiencing any problems since coming home? No a. Are there any questions regarding any aspect of care? No 2. Are there any questions regarding medications administration/dosing? No a. Are meds being taken as prescribed? Yes b. "Patient should review meds with caller to confirm"  Medication List Reviewed 3. Have there been any falls? No 4. Has Home Health been to the house and/or have they contacted you? Yes: [facility] a. If not, have you tried to contact them? NA b. Can we help you contact them? NA 5. Are bowels and bladder emptying properly? Yes a. Are there any unexpected incontinence issues? No b. If applicable, is patient following bowel/bladder programs? NA 6. Any fevers, problems with breathing, unexpected pain? No 7. Are there any skin problems or new areas of breakdown? No 8. Has the patient/family member arranged specialty MD follow up (ie cardiology/neurology/renal/surgical/etc.)?  Yes a. Can we help arrange? NA 9. Does the patient need any other services or support that we can help arrange? No 10. Are caregivers following through as expected in assisting the patient? Yes 11. Has the patient quit smoking, drinking alcohol, or using drugs as recommended? Ms Belcourt denies smoking, drinking alcohol or use of illicit drug.   Appointment date/time 09/28/2017, arrival time 2:20 for 2:40 appointment with Elray Mcgregor at [place]

## 2017-09-22 DIAGNOSIS — D631 Anemia in chronic kidney disease: Secondary | ICD-10-CM | POA: Diagnosis not present

## 2017-09-22 DIAGNOSIS — I1311 Hypertensive heart and chronic kidney disease without heart failure, with stage 5 chronic kidney disease, or end stage renal disease: Secondary | ICD-10-CM | POA: Diagnosis not present

## 2017-09-22 DIAGNOSIS — T8140XD Infection following a procedure, unspecified, subsequent encounter: Secondary | ICD-10-CM | POA: Diagnosis not present

## 2017-09-22 DIAGNOSIS — S065X0S Traumatic subdural hemorrhage without loss of consciousness, sequela: Secondary | ICD-10-CM | POA: Diagnosis not present

## 2017-09-22 DIAGNOSIS — E876 Hypokalemia: Secondary | ICD-10-CM | POA: Diagnosis not present

## 2017-09-22 DIAGNOSIS — I69393 Ataxia following cerebral infarction: Secondary | ICD-10-CM | POA: Diagnosis not present

## 2017-09-22 DIAGNOSIS — N186 End stage renal disease: Secondary | ICD-10-CM | POA: Diagnosis not present

## 2017-09-22 DIAGNOSIS — D509 Iron deficiency anemia, unspecified: Secondary | ICD-10-CM | POA: Diagnosis not present

## 2017-09-22 DIAGNOSIS — N2581 Secondary hyperparathyroidism of renal origin: Secondary | ICD-10-CM | POA: Diagnosis not present

## 2017-09-22 DIAGNOSIS — E1122 Type 2 diabetes mellitus with diabetic chronic kidney disease: Secondary | ICD-10-CM | POA: Diagnosis not present

## 2017-09-22 DIAGNOSIS — G8191 Hemiplegia, unspecified affecting right dominant side: Secondary | ICD-10-CM | POA: Diagnosis not present

## 2017-09-22 DIAGNOSIS — I2510 Atherosclerotic heart disease of native coronary artery without angina pectoris: Secondary | ICD-10-CM | POA: Diagnosis not present

## 2017-09-23 DIAGNOSIS — G8191 Hemiplegia, unspecified affecting right dominant side: Secondary | ICD-10-CM | POA: Diagnosis not present

## 2017-09-23 DIAGNOSIS — I69393 Ataxia following cerebral infarction: Secondary | ICD-10-CM | POA: Diagnosis not present

## 2017-09-23 DIAGNOSIS — I1311 Hypertensive heart and chronic kidney disease without heart failure, with stage 5 chronic kidney disease, or end stage renal disease: Secondary | ICD-10-CM | POA: Diagnosis not present

## 2017-09-23 DIAGNOSIS — S065X0S Traumatic subdural hemorrhage without loss of consciousness, sequela: Secondary | ICD-10-CM | POA: Diagnosis not present

## 2017-09-23 DIAGNOSIS — I2510 Atherosclerotic heart disease of native coronary artery without angina pectoris: Secondary | ICD-10-CM | POA: Diagnosis not present

## 2017-09-23 DIAGNOSIS — T8140XD Infection following a procedure, unspecified, subsequent encounter: Secondary | ICD-10-CM | POA: Diagnosis not present

## 2017-09-24 DIAGNOSIS — T8140XD Infection following a procedure, unspecified, subsequent encounter: Secondary | ICD-10-CM | POA: Diagnosis not present

## 2017-09-24 DIAGNOSIS — D509 Iron deficiency anemia, unspecified: Secondary | ICD-10-CM | POA: Diagnosis not present

## 2017-09-24 DIAGNOSIS — N2581 Secondary hyperparathyroidism of renal origin: Secondary | ICD-10-CM | POA: Diagnosis not present

## 2017-09-24 DIAGNOSIS — Z992 Dependence on renal dialysis: Secondary | ICD-10-CM | POA: Diagnosis not present

## 2017-09-24 DIAGNOSIS — E1122 Type 2 diabetes mellitus with diabetic chronic kidney disease: Secondary | ICD-10-CM | POA: Diagnosis not present

## 2017-09-24 DIAGNOSIS — G8191 Hemiplegia, unspecified affecting right dominant side: Secondary | ICD-10-CM | POA: Diagnosis not present

## 2017-09-24 DIAGNOSIS — E876 Hypokalemia: Secondary | ICD-10-CM | POA: Diagnosis not present

## 2017-09-24 DIAGNOSIS — I159 Secondary hypertension, unspecified: Secondary | ICD-10-CM | POA: Diagnosis not present

## 2017-09-24 DIAGNOSIS — N186 End stage renal disease: Secondary | ICD-10-CM | POA: Diagnosis not present

## 2017-09-24 DIAGNOSIS — I1311 Hypertensive heart and chronic kidney disease without heart failure, with stage 5 chronic kidney disease, or end stage renal disease: Secondary | ICD-10-CM | POA: Diagnosis not present

## 2017-09-24 DIAGNOSIS — I69393 Ataxia following cerebral infarction: Secondary | ICD-10-CM | POA: Diagnosis not present

## 2017-09-24 DIAGNOSIS — D631 Anemia in chronic kidney disease: Secondary | ICD-10-CM | POA: Diagnosis not present

## 2017-09-24 DIAGNOSIS — I2510 Atherosclerotic heart disease of native coronary artery without angina pectoris: Secondary | ICD-10-CM | POA: Diagnosis not present

## 2017-09-24 DIAGNOSIS — S065X0S Traumatic subdural hemorrhage without loss of consciousness, sequela: Secondary | ICD-10-CM | POA: Diagnosis not present

## 2017-09-27 DIAGNOSIS — D509 Iron deficiency anemia, unspecified: Secondary | ICD-10-CM | POA: Diagnosis not present

## 2017-09-27 DIAGNOSIS — N186 End stage renal disease: Secondary | ICD-10-CM | POA: Diagnosis not present

## 2017-09-27 DIAGNOSIS — D631 Anemia in chronic kidney disease: Secondary | ICD-10-CM | POA: Diagnosis not present

## 2017-09-27 DIAGNOSIS — G8191 Hemiplegia, unspecified affecting right dominant side: Secondary | ICD-10-CM | POA: Diagnosis not present

## 2017-09-27 DIAGNOSIS — N2581 Secondary hyperparathyroidism of renal origin: Secondary | ICD-10-CM | POA: Diagnosis not present

## 2017-09-27 DIAGNOSIS — I2510 Atherosclerotic heart disease of native coronary artery without angina pectoris: Secondary | ICD-10-CM | POA: Diagnosis not present

## 2017-09-27 DIAGNOSIS — I69393 Ataxia following cerebral infarction: Secondary | ICD-10-CM | POA: Diagnosis not present

## 2017-09-27 DIAGNOSIS — E1122 Type 2 diabetes mellitus with diabetic chronic kidney disease: Secondary | ICD-10-CM | POA: Diagnosis not present

## 2017-09-27 DIAGNOSIS — Z992 Dependence on renal dialysis: Secondary | ICD-10-CM | POA: Diagnosis not present

## 2017-09-27 DIAGNOSIS — S065X0S Traumatic subdural hemorrhage without loss of consciousness, sequela: Secondary | ICD-10-CM | POA: Diagnosis not present

## 2017-09-27 DIAGNOSIS — I1311 Hypertensive heart and chronic kidney disease without heart failure, with stage 5 chronic kidney disease, or end stage renal disease: Secondary | ICD-10-CM | POA: Diagnosis not present

## 2017-09-27 DIAGNOSIS — T8140XD Infection following a procedure, unspecified, subsequent encounter: Secondary | ICD-10-CM | POA: Diagnosis not present

## 2017-09-28 ENCOUNTER — Encounter: Payer: Medicare Other | Attending: Registered Nurse | Admitting: Registered Nurse

## 2017-09-28 ENCOUNTER — Encounter: Payer: Self-pay | Admitting: Registered Nurse

## 2017-09-28 ENCOUNTER — Ambulatory Visit
Admission: RE | Admit: 2017-09-28 | Discharge: 2017-09-28 | Disposition: A | Discharge status: Home / Self Care | Payer: Medicare Other | Source: Ambulatory Visit | Attending: Neurosurgery | Admitting: Neurosurgery

## 2017-09-28 ENCOUNTER — Other Ambulatory Visit: Payer: Self-pay | Admitting: Neurosurgery

## 2017-09-28 VITALS — BP 163/54 | HR 98

## 2017-09-28 DIAGNOSIS — E785 Hyperlipidemia, unspecified: Secondary | ICD-10-CM | POA: Insufficient documentation

## 2017-09-28 DIAGNOSIS — Z992 Dependence on renal dialysis: Secondary | ICD-10-CM | POA: Diagnosis not present

## 2017-09-28 DIAGNOSIS — K219 Gastro-esophageal reflux disease without esophagitis: Secondary | ICD-10-CM | POA: Insufficient documentation

## 2017-09-28 DIAGNOSIS — Z87891 Personal history of nicotine dependence: Secondary | ICD-10-CM | POA: Insufficient documentation

## 2017-09-28 DIAGNOSIS — S065X0A Traumatic subdural hemorrhage without loss of consciousness, initial encounter: Secondary | ICD-10-CM | POA: Diagnosis not present

## 2017-09-28 DIAGNOSIS — S065XAA Traumatic subdural hemorrhage with loss of consciousness status unknown, initial encounter: Secondary | ICD-10-CM

## 2017-09-28 DIAGNOSIS — I132 Hypertensive heart and chronic kidney disease with heart failure and with stage 5 chronic kidney disease, or end stage renal disease: Secondary | ICD-10-CM | POA: Diagnosis not present

## 2017-09-28 DIAGNOSIS — Z90710 Acquired absence of both cervix and uterus: Secondary | ICD-10-CM | POA: Insufficient documentation

## 2017-09-28 DIAGNOSIS — S98111A Complete traumatic amputation of right great toe, initial encounter: Secondary | ICD-10-CM

## 2017-09-28 DIAGNOSIS — Z800 Family history of malignant neoplasm of digestive organs: Secondary | ICD-10-CM | POA: Insufficient documentation

## 2017-09-28 DIAGNOSIS — E1142 Type 2 diabetes mellitus with diabetic polyneuropathy: Secondary | ICD-10-CM

## 2017-09-28 DIAGNOSIS — J45909 Unspecified asthma, uncomplicated: Secondary | ICD-10-CM | POA: Insufficient documentation

## 2017-09-28 DIAGNOSIS — S065X0S Traumatic subdural hemorrhage without loss of consciousness, sequela: Secondary | ICD-10-CM | POA: Diagnosis not present

## 2017-09-28 DIAGNOSIS — I509 Heart failure, unspecified: Secondary | ICD-10-CM | POA: Insufficient documentation

## 2017-09-28 DIAGNOSIS — N186 End stage renal disease: Secondary | ICD-10-CM

## 2017-09-28 DIAGNOSIS — Z89411 Acquired absence of right great toe: Secondary | ICD-10-CM

## 2017-09-28 DIAGNOSIS — G4730 Sleep apnea, unspecified: Secondary | ICD-10-CM | POA: Insufficient documentation

## 2017-09-28 DIAGNOSIS — I7025 Atherosclerosis of native arteries of other extremities with ulceration: Secondary | ICD-10-CM

## 2017-09-28 DIAGNOSIS — I2510 Atherosclerotic heart disease of native coronary artery without angina pectoris: Secondary | ICD-10-CM | POA: Diagnosis not present

## 2017-09-28 DIAGNOSIS — G8191 Hemiplegia, unspecified affecting right dominant side: Secondary | ICD-10-CM | POA: Diagnosis not present

## 2017-09-28 DIAGNOSIS — Z9889 Other specified postprocedural states: Secondary | ICD-10-CM | POA: Diagnosis not present

## 2017-09-28 DIAGNOSIS — S065X9D Traumatic subdural hemorrhage with loss of consciousness of unspecified duration, subsequent encounter: Secondary | ICD-10-CM | POA: Diagnosis not present

## 2017-09-28 DIAGNOSIS — E1151 Type 2 diabetes mellitus with diabetic peripheral angiopathy without gangrene: Secondary | ICD-10-CM | POA: Insufficient documentation

## 2017-09-28 DIAGNOSIS — E1122 Type 2 diabetes mellitus with diabetic chronic kidney disease: Secondary | ICD-10-CM | POA: Diagnosis not present

## 2017-09-28 DIAGNOSIS — E1140 Type 2 diabetes mellitus with diabetic neuropathy, unspecified: Secondary | ICD-10-CM | POA: Insufficient documentation

## 2017-09-28 DIAGNOSIS — Z833 Family history of diabetes mellitus: Secondary | ICD-10-CM | POA: Insufficient documentation

## 2017-09-28 DIAGNOSIS — S065X9A Traumatic subdural hemorrhage with loss of consciousness of unspecified duration, initial encounter: Secondary | ICD-10-CM | POA: Diagnosis not present

## 2017-09-28 DIAGNOSIS — E119 Type 2 diabetes mellitus without complications: Secondary | ICD-10-CM

## 2017-09-28 DIAGNOSIS — F419 Anxiety disorder, unspecified: Secondary | ICD-10-CM | POA: Insufficient documentation

## 2017-09-28 DIAGNOSIS — I10 Essential (primary) hypertension: Secondary | ICD-10-CM | POA: Diagnosis not present

## 2017-09-28 DIAGNOSIS — M1990 Unspecified osteoarthritis, unspecified site: Secondary | ICD-10-CM | POA: Diagnosis not present

## 2017-09-28 DIAGNOSIS — I1311 Hypertensive heart and chronic kidney disease without heart failure, with stage 5 chronic kidney disease, or end stage renal disease: Secondary | ICD-10-CM | POA: Diagnosis not present

## 2017-09-28 DIAGNOSIS — T8140XD Infection following a procedure, unspecified, subsequent encounter: Secondary | ICD-10-CM | POA: Diagnosis not present

## 2017-09-28 DIAGNOSIS — I69393 Ataxia following cerebral infarction: Secondary | ICD-10-CM | POA: Diagnosis not present

## 2017-09-28 NOTE — Progress Notes (Signed)
Subjective:    Patient ID: Destiny Day, female    DOB: 1951/06/16, 66 y.o.   MRN: [id number]  HPI: [name] is a 67 year old female who is here for transitional care visit in follow of her subdural hematoma, right great toe amputation, HTN, diabetic peripheral neuropathy, DM Type 2 and ESRD. She has been home with  Home therapies with [facility]. Also reports she's not receiving her therapies as scheduled from [facility], this provider placed a call to [facility], the therapist will be out on 09/30/2017. Placed a call to Destiny Day, no answer left message.  Reports good appetite, she denies pain. Walking with walker. Husband in room all questions answered.   Pain Inventory Average Pain 0 Pain Right Now 0 My pain is na  In the last 24 hours, has pain interfered with the following? General activity 0 Relation with others 0 Enjoyment of life 0 What TIME of day is your pain at its worst? na Sleep (in general) Good  Pain is worse with: na Pain improves with: na Relief from Meds: 0  Mobility use a walker ability to climb steps?  no do you drive?  no  Function disabled: date disabled .  Neuro/Psych anxiety  Prior Studies Any changes since last visit?  no  Physicians involved in your care Any changes since last visit?  no   Family History  Problem Relation Age of Onset  . Other Mother        not sure of cause of death  . Diabetes Father   . Pancreatic cancer Maternal Grandmother   . Colon cancer Neg Hx    Social History   Socioeconomic History  . Marital status: Married    Spouse name: [name]  . Number of children: 4  . Years of education: some collg  . Highest education level: Not on file  Social Needs  . Financial resource strain: Not on file  . Food insecurity - worry: Not on file  . Food insecurity - inability: Not on file  . Transportation needs - medical: Not on file  . Transportation needs - non-medical: Not on file  Occupational  History  . Occupation: Retired  Tobacco Use  . Smoking status: Former Smoker    Packs/day: 0.35    Years: 40.00    Pack years: 14.00    Types: Cigarettes    Last attempt to quit: 05/10/2012    Years since quitting: 5.3  . Smokeless tobacco: Never Used  Substance and Sexual Activity  . Alcohol use: No  . Drug use: No    Comment: marijuana; quit in early 1980's  . Sexual activity: Yes  Other Topics Concern  . Not on file  Social History Narrative   Patient lives at home with spouse.   Caffeine Use: 1 cup daily   Past Surgical History:  Procedure Laterality Date  . A/V SHUNTOGRAM N/A 09/22/2016   Procedure: A/V Shuntogram - left arm;  Surgeon: Serafina Mitchell, MD;  Location: [place] [facility];  Service: Cardiovascular;  Laterality: N/A;  . ABDOMINAL HYSTERECTOMY  1993`  . AMPUTATION Left 09/01/2016   Procedure: LEFT FOOT TRANSMETATARSAL AMPUTATION;  Surgeon: Newt Minion, MD;  Location: [place];  Service: Orthopedics;  Laterality: Left;  . AMPUTATION Right 08/11/2017   Procedure: RIGHT GREAT TOE AMPUTATION DIGIT;  Surgeon: Rosetta Posner, MD;  Location: [facility];  Service: Vascular;  Laterality: Right;  . AV FISTULA PLACEMENT Left 04/21/2016   Procedure: INSERTION OF  ARTERIOVENOUS (AV) GORE-TEX GRAFT ARM LEFT;  Surgeon: Elam Dutch, MD;  Location: [place];  Service: Vascular;  Laterality: Left;  . [place] TRANSPOSITION Left 07/10/2014   Procedure: BASCILIC VEIN TRANSPOSITION;  Surgeon: Angelia Mould, MD;  Location: [place];  Service: Vascular;  Laterality: Left;  . BASCILIC VEIN TRANSPOSITION Right 11/08/2014   Procedure: FIRST STAGE BASILIC VEIN TRANSPOSITION;  Surgeon: Angelia Mould, MD;  Location: [place];  Service: Vascular;  Laterality: Right;  . BASCILIC VEIN TRANSPOSITION Right 01/18/2015   Procedure: SECOND STAGE BASILIC VEIN TRANSPOSITION;  Surgeon: Angelia Mould, MD;  Location: [place];  Service: Vascular;  Laterality: Right;  . CRANIOTOMY N/A 08/23/2017     Procedure: CRANIOTOMY HEMATOMA EVACUATION SUBDURAL;  Surgeon: Ashok Pall, MD;  Location: [place];  Service: Neurosurgery;  Laterality: N/A;  . CRANIOTOMY Left 08/24/2017   Procedure: CRANIOTOMY FOR RECURRENT ACUTE SUBDURAL HEMATOMA;  Surgeon: Ashok Pall, MD;  Location: [place];  Service: Neurosurgery;  Laterality: Left;  . ESOPHAGOGASTRODUODENOSCOPY N/A 03/31/2013   Procedure: ESOPHAGOGASTRODUODENOSCOPY (EGD);  Surgeon: Gatha Mayer, MD;  Location[facility]COPY;  Service: Endoscopy;  Laterality: N/A;  . EYE SURGERY     laser surgery  . FEMORAL-POPLITEAL BYPASS GRAFT Left 07/08/2016   Procedure: LEFT  FEMORAL-BELOW KNEE POPLITEAL ARTERY BYPASS GRAFT USING 6MM X 80 CM PROPATEN GORETEX GRAFT WITH RINGS.;  Surgeon: Rosetta Posner, MD;  Location: [place];  Service: Vascular;  Laterality: Left;  . FEMORAL-POPLITEAL BYPASS GRAFT Right 08/11/2017   Procedure: RIGHT FEMORAL TO BELOW KNEE POPLITEAL ARTERKY  BYPASS GRAFT USING 6MM RINGED PROPATEN GRAFT;  Surgeon: Rosetta Posner, MD;  Location: [place];  Service: Vascular;  Laterality: Right;  . FISTULOGRAM Left 10/29/2014   Procedure: FISTULOGRAM;  Surgeon: Angelia Mould, MD;  Location[facility] C[facility] LAB;  Service: Cardiovascular;  Laterality: Left;  . LIGATION OF ARTERIOVENOUS  FISTULA Left 04/21/2016   Procedure: LIGATION OF ARTERIOVENOUS  FISTULA LEFT ARM;  Surgeon: Elam Dutch, MD;  Location: [place];  Service: Vascular;  Laterality: Left;  . LOWER EXTREMITY ANGIOGRAPHY N/A 04/06/2017   Procedure: Lower Extremity Angiography - Right;  Surgeon: Serafina Mitchell, MD;  Location: [place] [facility];  Service: Cardiovascular;  Laterality: N/A;  . PATCH ANGIOPLASTY Right 01/18/2015   Procedure: BASILIC VEIN PATCH ANGIOPLASTY USING VASCUGUARD PATCH;  Surgeon: Angelia Mould, MD;  Location: [place];  Service: Vascular;  Laterality: Right;  . PERIPHERAL VASCULAR BALLOON ANGIOPLASTY  09/22/2016   Procedure: Peripheral Vascular Balloon Angioplasty;  Surgeon: Serafina Mitchell, MD;  Location: [place] [facility];  Service: Cardiovascular;;  Lt. Fistula  . PERIPHERAL VASCULAR CATHETERIZATION N/A 06/23/2016   Procedure: Abdominal Aortogram w/Lower Extremity;  Surgeon: Serafina Mitchell, MD;  Location: [place] [facility];  Service: Cardiovascular;  Laterality: N/A;  . PERIPHERAL VASCULAR CATHETERIZATION  06/23/2016   Procedure: Peripheral Vascular Intervention;  Surgeon: Serafina Mitchell, MD;  Location: [place] [facility];  Service: Cardiovascular;;  lt common and external illiac artery   Past Medical History:  Diagnosis Date  . Abdominal bruit   . Anemia   . Anxiety   . Arthritis    Osteoarthritis  . Asthma   . Cervical disc disease    "pinced nerve"  . CHF (congestive heart failure) ([place])   . Complication of anesthesia    " after I got home from my last procedure, I started itching."  . Diabetes mellitus    Type II  .  Diverticulitis   . ESRD on peritoneal dialysis [facility])    Hemodialysis - MWF- [place]   . GERD (gastroesophageal reflux disease)    from medications  . GI bleed 03/31/2013  . History of hiatal hernia   . Hyperlipidemia   . Hypertension   . Neuropathy    left leg  . Osteoporosis   . Peripheral vascular disease ([place])   . Pneumonia    "very young"  . Seasonal allergies   . Shortness of breath dyspnea    WIth exertion  . Sleep apnea    can't afford cpap   There were no vitals taken for this visit.  Opioid Risk Score:   Fall Risk Score:  `1  Depression screen PHQ 2/9  No flowsheet data found.   Review of Systems  Constitutional: Negative.   HENT: Negative.   Eyes: Negative.   Respiratory: Negative.   Cardiovascular: Negative.   Gastrointestinal: Negative.   Endocrine: Negative.   Genitourinary: Negative.   Musculoskeletal: Positive for gait problem.  Skin: Negative.   Allergic/Immunologic: Negative.   Hematological: Negative.   Psychiatric/Behavioral: Negative.   All other systems reviewed and are negative.       Objective:   Physical Exam  Constitutional: She is oriented to person, place, and time. She appears well-developed and well-nourished.  HENT:  Head: Normocephalic and atraumatic.  Neck: Normal range of motion. Neck supple.  Cardiovascular: Normal rate and regular rhythm.  Pulmonary/Chest: Effort normal and breath sounds normal.  Musculoskeletal:  Normal Muscle Bulk and Muscle Testing Reveals" Upper Extremities: Full ROM and Muscle Strength 5/5 Lower Extremities: Right: Decreased ROM and Muscle Strength 4/5 Left: Full ROM and Muscle Strength 5/5 Arises from Table slowly Narrow Based Gait  Neurological: She is alert and oriented to person, place, and time.  Skin: Skin is warm and dry.  Psychiatric: She has a normal mood and affect.  Nursing note and vitals reviewed.         Assessment & Plan:  1. Subdural Hematoma/ S/P Injury: F/U with Neurology. Continue to Monitor 2. Amputation of Right Great Toe: F/U with Vascular: Dr. Donnetta Hutching 3. HTN:  Continue current medication regimen: PCP Following 4. ESRD: Nephrology Following Continue to Monitor.  5. Type 2 DM/ Diabetic Peripheral Neuropathy: Continue Gabapentin: PCP Following.  30 minutes of face to face patient care time was spent during this visit. All questions were encouraged and answered.  F/U 4-6 weeks with Dr. Posey Pronto

## 2017-09-29 ENCOUNTER — Encounter: Payer: Medicare Other | Admitting: Registered Nurse

## 2017-09-29 DIAGNOSIS — I1311 Hypertensive heart and chronic kidney disease without heart failure, with stage 5 chronic kidney disease, or end stage renal disease: Secondary | ICD-10-CM | POA: Diagnosis not present

## 2017-09-29 DIAGNOSIS — D631 Anemia in chronic kidney disease: Secondary | ICD-10-CM | POA: Diagnosis not present

## 2017-09-29 DIAGNOSIS — I2510 Atherosclerotic heart disease of native coronary artery without angina pectoris: Secondary | ICD-10-CM | POA: Diagnosis not present

## 2017-09-29 DIAGNOSIS — N186 End stage renal disease: Secondary | ICD-10-CM | POA: Diagnosis not present

## 2017-09-29 DIAGNOSIS — E1122 Type 2 diabetes mellitus with diabetic chronic kidney disease: Secondary | ICD-10-CM | POA: Diagnosis not present

## 2017-09-29 DIAGNOSIS — G8191 Hemiplegia, unspecified affecting right dominant side: Secondary | ICD-10-CM | POA: Diagnosis not present

## 2017-09-29 DIAGNOSIS — T8140XD Infection following a procedure, unspecified, subsequent encounter: Secondary | ICD-10-CM | POA: Diagnosis not present

## 2017-09-29 DIAGNOSIS — N2581 Secondary hyperparathyroidism of renal origin: Secondary | ICD-10-CM | POA: Diagnosis not present

## 2017-09-29 DIAGNOSIS — I69393 Ataxia following cerebral infarction: Secondary | ICD-10-CM | POA: Diagnosis not present

## 2017-09-29 DIAGNOSIS — Z992 Dependence on renal dialysis: Secondary | ICD-10-CM | POA: Diagnosis not present

## 2017-09-29 DIAGNOSIS — D509 Iron deficiency anemia, unspecified: Secondary | ICD-10-CM | POA: Diagnosis not present

## 2017-09-29 DIAGNOSIS — S065X0S Traumatic subdural hemorrhage without loss of consciousness, sequela: Secondary | ICD-10-CM | POA: Diagnosis not present

## 2017-09-30 DIAGNOSIS — T8140XD Infection following a procedure, unspecified, subsequent encounter: Secondary | ICD-10-CM | POA: Diagnosis not present

## 2017-09-30 DIAGNOSIS — S065X0S Traumatic subdural hemorrhage without loss of consciousness, sequela: Secondary | ICD-10-CM | POA: Diagnosis not present

## 2017-09-30 DIAGNOSIS — I2510 Atherosclerotic heart disease of native coronary artery without angina pectoris: Secondary | ICD-10-CM | POA: Diagnosis not present

## 2017-09-30 DIAGNOSIS — G8191 Hemiplegia, unspecified affecting right dominant side: Secondary | ICD-10-CM | POA: Diagnosis not present

## 2017-09-30 DIAGNOSIS — I1311 Hypertensive heart and chronic kidney disease without heart failure, with stage 5 chronic kidney disease, or end stage renal disease: Secondary | ICD-10-CM | POA: Diagnosis not present

## 2017-09-30 DIAGNOSIS — I69393 Ataxia following cerebral infarction: Secondary | ICD-10-CM | POA: Diagnosis not present

## 2017-10-01 DIAGNOSIS — Z992 Dependence on renal dialysis: Secondary | ICD-10-CM | POA: Diagnosis not present

## 2017-10-01 DIAGNOSIS — N186 End stage renal disease: Secondary | ICD-10-CM | POA: Diagnosis not present

## 2017-10-01 DIAGNOSIS — E1122 Type 2 diabetes mellitus with diabetic chronic kidney disease: Secondary | ICD-10-CM | POA: Diagnosis not present

## 2017-10-01 DIAGNOSIS — D631 Anemia in chronic kidney disease: Secondary | ICD-10-CM | POA: Diagnosis not present

## 2017-10-01 DIAGNOSIS — D509 Iron deficiency anemia, unspecified: Secondary | ICD-10-CM | POA: Diagnosis not present

## 2017-10-01 DIAGNOSIS — N2581 Secondary hyperparathyroidism of renal origin: Secondary | ICD-10-CM | POA: Diagnosis not present

## 2017-10-04 DIAGNOSIS — D631 Anemia in chronic kidney disease: Secondary | ICD-10-CM | POA: Diagnosis not present

## 2017-10-04 DIAGNOSIS — Z992 Dependence on renal dialysis: Secondary | ICD-10-CM | POA: Diagnosis not present

## 2017-10-04 DIAGNOSIS — E1122 Type 2 diabetes mellitus with diabetic chronic kidney disease: Secondary | ICD-10-CM | POA: Diagnosis not present

## 2017-10-04 DIAGNOSIS — D509 Iron deficiency anemia, unspecified: Secondary | ICD-10-CM | POA: Diagnosis not present

## 2017-10-04 DIAGNOSIS — N2581 Secondary hyperparathyroidism of renal origin: Secondary | ICD-10-CM | POA: Diagnosis not present

## 2017-10-04 DIAGNOSIS — N186 End stage renal disease: Secondary | ICD-10-CM | POA: Diagnosis not present

## 2017-10-05 ENCOUNTER — Ambulatory Visit (INDEPENDENT_AMBULATORY_CARE_PROVIDER_SITE_OTHER): Payer: Self-pay | Admitting: Vascular Surgery

## 2017-10-05 ENCOUNTER — Encounter: Payer: Self-pay | Admitting: Vascular Surgery

## 2017-10-05 ENCOUNTER — Other Ambulatory Visit: Payer: Self-pay

## 2017-10-05 VITALS — BP 199/79 | HR 162 | Temp 99.4°F | Resp 16 | Ht 65.0 in | Wt 144.0 lb

## 2017-10-05 DIAGNOSIS — I69393 Ataxia following cerebral infarction: Secondary | ICD-10-CM | POA: Diagnosis not present

## 2017-10-05 DIAGNOSIS — I1311 Hypertensive heart and chronic kidney disease without heart failure, with stage 5 chronic kidney disease, or end stage renal disease: Secondary | ICD-10-CM | POA: Diagnosis not present

## 2017-10-05 DIAGNOSIS — T8140XD Infection following a procedure, unspecified, subsequent encounter: Secondary | ICD-10-CM | POA: Diagnosis not present

## 2017-10-05 DIAGNOSIS — I739 Peripheral vascular disease, unspecified: Secondary | ICD-10-CM

## 2017-10-05 DIAGNOSIS — G8191 Hemiplegia, unspecified affecting right dominant side: Secondary | ICD-10-CM | POA: Diagnosis not present

## 2017-10-05 DIAGNOSIS — I2510 Atherosclerotic heart disease of native coronary artery without angina pectoris: Secondary | ICD-10-CM | POA: Diagnosis not present

## 2017-10-05 DIAGNOSIS — S065X0S Traumatic subdural hemorrhage without loss of consciousness, sequela: Secondary | ICD-10-CM | POA: Diagnosis not present

## 2017-10-05 NOTE — Progress Notes (Signed)
Progress Note Bypass Surgery  Date of Surgery:08/11/2017   Surgeon: Dr. Sherren Mocha Ivonne [place]  History of Present Illness  Destiny Day is a 67 y.o. female who is here for a follow up visit s/p right femoral to below-knee popliteal bypass with PTFE conduit as well as right great toe amputation by Dr. Donnetta Hutching on 08/11/17.   She was then transfered to [facility] inpatient rehabilitation unit.  On 08/23/2017 she fell in her room and sustained Head CT revealed a large Acute subdural hematoma on the left side causing mass effect, midline shift left to right, and impending herniation. She was taken to the OR twice for craniotomy and hematoma evacuation by Dr. Christella Noa.    Her right great toe amputation site healed well.  Slow healing of the right groin which required wet to dry dressing changes and antibiotics for pseudomonas.  At discharge on 08/30/2017 she had well healed right groin wound and right Toe amputation site, ambulating with a rolling walker, and palpable DP pulse.  She continues to work with PT therapy and is making good progress cognitively.          Imaging: No results found.  Significant Diagnostic Studies: CBC Lab Results  Component Value Date   WBC 11.7 (H) 09/13/2017   HGB 9.6 (L) 09/13/2017   HCT 31.2 (L) 09/13/2017   MCV 103.7 (H) 09/13/2017   PLT 243 09/13/2017    BMET    Component Value Date/Time   NA 130 (L) 09/13/2017 1446   NA 135 (A) 07/15/2016   K 4.7 09/13/2017 1446   CL 93 (L) 09/13/2017 1446   CO2 25 09/13/2017 1446   GLUCOSE 170 (H) 09/13/2017 1446   BUN 44 (H) 09/13/2017 1446   BUN 30 (A) 07/15/2016   CREATININE 6.07 (H) 09/13/2017 1446   CALCIUM 9.2 09/13/2017 1446   GFRNONAA 6 (L) 09/13/2017 1446   GFRAA 8 (L) 09/13/2017 1446    COAG Lab Results  Component Value Date   INR 1.02 08/23/2017   INR 1.05 08/11/2017   INR 1.03 07/07/2016   No results found for: PTT  Physical Examination  BP Readings from Last 3 Encounters:  10/05/17 (!) 199/79   09/28/17 (!) 163/54  09/16/17 117/80   Temp Readings from Last 3 Encounters:  10/05/17 99.4 F (37.4 C) (Oral)  09/16/17 99.2 F (37.3 C) (Oral)  08/30/17 97.8 F (36.6 C) (Oral)   @LASTSAO2 (3)@ Pulse Readings from Last 3 Encounters:  10/05/17 (!) 162  09/28/17 98  09/16/17 87    Pt is A&O x 3 Palpable right DP, right LE leg incision are well healed. Right groin incision well healed Lungs CTA Heart RRR Abdomin soft NTTP, + BS   Assessment/Plan: S/P Right Fem-pop by pass with propatan ACUTE SUBDURAL HEMATOMA Right LE edema on/off.  Recommend continued activity and mobility.  Elevation of the right LE when at rest.  She wants to try compression on the right LE because it is difficult to put on her shoes due to edema.  Low grade compression may benefit the edema, but it will dissipate with time and continued mobility as she tolerates.    She will F/U in 3 months with Dr. Donnetta Hutching.  We have scheduled repeat ABI's B and arterial Duplex of the LE.     Roxy Horseman  PA-C   The patient was seen in conjunction with Dr. Donnetta Hutching today  10/05/2017 2:35 PM   I have examined the patient, reviewed and agree with  above.  Curt Jews, MD 10/05/2017 3:09 PM

## 2017-10-06 ENCOUNTER — Other Ambulatory Visit: Payer: Self-pay

## 2017-10-06 DIAGNOSIS — Z992 Dependence on renal dialysis: Secondary | ICD-10-CM | POA: Diagnosis not present

## 2017-10-06 DIAGNOSIS — I739 Peripheral vascular disease, unspecified: Secondary | ICD-10-CM

## 2017-10-06 DIAGNOSIS — D509 Iron deficiency anemia, unspecified: Secondary | ICD-10-CM | POA: Diagnosis not present

## 2017-10-06 DIAGNOSIS — N2581 Secondary hyperparathyroidism of renal origin: Secondary | ICD-10-CM | POA: Diagnosis not present

## 2017-10-06 DIAGNOSIS — D631 Anemia in chronic kidney disease: Secondary | ICD-10-CM | POA: Diagnosis not present

## 2017-10-06 DIAGNOSIS — N186 End stage renal disease: Secondary | ICD-10-CM | POA: Diagnosis not present

## 2017-10-06 DIAGNOSIS — E1122 Type 2 diabetes mellitus with diabetic chronic kidney disease: Secondary | ICD-10-CM | POA: Diagnosis not present

## 2017-10-07 DIAGNOSIS — I2510 Atherosclerotic heart disease of native coronary artery without angina pectoris: Secondary | ICD-10-CM | POA: Diagnosis not present

## 2017-10-07 DIAGNOSIS — S065X0S Traumatic subdural hemorrhage without loss of consciousness, sequela: Secondary | ICD-10-CM | POA: Diagnosis not present

## 2017-10-07 DIAGNOSIS — I69393 Ataxia following cerebral infarction: Secondary | ICD-10-CM | POA: Diagnosis not present

## 2017-10-07 DIAGNOSIS — T8140XD Infection following a procedure, unspecified, subsequent encounter: Secondary | ICD-10-CM | POA: Diagnosis not present

## 2017-10-07 DIAGNOSIS — G8191 Hemiplegia, unspecified affecting right dominant side: Secondary | ICD-10-CM | POA: Diagnosis not present

## 2017-10-07 DIAGNOSIS — I1311 Hypertensive heart and chronic kidney disease without heart failure, with stage 5 chronic kidney disease, or end stage renal disease: Secondary | ICD-10-CM | POA: Diagnosis not present

## 2017-10-08 DIAGNOSIS — D509 Iron deficiency anemia, unspecified: Secondary | ICD-10-CM | POA: Diagnosis not present

## 2017-10-08 DIAGNOSIS — N186 End stage renal disease: Secondary | ICD-10-CM | POA: Diagnosis not present

## 2017-10-08 DIAGNOSIS — E1122 Type 2 diabetes mellitus with diabetic chronic kidney disease: Secondary | ICD-10-CM | POA: Diagnosis not present

## 2017-10-08 DIAGNOSIS — D631 Anemia in chronic kidney disease: Secondary | ICD-10-CM | POA: Diagnosis not present

## 2017-10-08 DIAGNOSIS — Z992 Dependence on renal dialysis: Secondary | ICD-10-CM | POA: Diagnosis not present

## 2017-10-08 DIAGNOSIS — N2581 Secondary hyperparathyroidism of renal origin: Secondary | ICD-10-CM | POA: Diagnosis not present

## 2017-10-11 DIAGNOSIS — Z992 Dependence on renal dialysis: Secondary | ICD-10-CM | POA: Diagnosis not present

## 2017-10-11 DIAGNOSIS — D509 Iron deficiency anemia, unspecified: Secondary | ICD-10-CM | POA: Diagnosis not present

## 2017-10-11 DIAGNOSIS — D631 Anemia in chronic kidney disease: Secondary | ICD-10-CM | POA: Diagnosis not present

## 2017-10-11 DIAGNOSIS — E1122 Type 2 diabetes mellitus with diabetic chronic kidney disease: Secondary | ICD-10-CM | POA: Diagnosis not present

## 2017-10-11 DIAGNOSIS — N2581 Secondary hyperparathyroidism of renal origin: Secondary | ICD-10-CM | POA: Diagnosis not present

## 2017-10-11 DIAGNOSIS — N186 End stage renal disease: Secondary | ICD-10-CM | POA: Diagnosis not present

## 2017-10-12 DIAGNOSIS — G8191 Hemiplegia, unspecified affecting right dominant side: Secondary | ICD-10-CM | POA: Diagnosis not present

## 2017-10-12 DIAGNOSIS — I1311 Hypertensive heart and chronic kidney disease without heart failure, with stage 5 chronic kidney disease, or end stage renal disease: Secondary | ICD-10-CM | POA: Diagnosis not present

## 2017-10-12 DIAGNOSIS — S065X0S Traumatic subdural hemorrhage without loss of consciousness, sequela: Secondary | ICD-10-CM | POA: Diagnosis not present

## 2017-10-12 DIAGNOSIS — I69393 Ataxia following cerebral infarction: Secondary | ICD-10-CM | POA: Diagnosis not present

## 2017-10-12 DIAGNOSIS — I2510 Atherosclerotic heart disease of native coronary artery without angina pectoris: Secondary | ICD-10-CM | POA: Diagnosis not present

## 2017-10-12 DIAGNOSIS — T8140XD Infection following a procedure, unspecified, subsequent encounter: Secondary | ICD-10-CM | POA: Diagnosis not present

## 2017-10-13 DIAGNOSIS — D509 Iron deficiency anemia, unspecified: Secondary | ICD-10-CM | POA: Diagnosis not present

## 2017-10-13 DIAGNOSIS — E1122 Type 2 diabetes mellitus with diabetic chronic kidney disease: Secondary | ICD-10-CM | POA: Diagnosis not present

## 2017-10-13 DIAGNOSIS — Z992 Dependence on renal dialysis: Secondary | ICD-10-CM | POA: Diagnosis not present

## 2017-10-13 DIAGNOSIS — D631 Anemia in chronic kidney disease: Secondary | ICD-10-CM | POA: Diagnosis not present

## 2017-10-13 DIAGNOSIS — N186 End stage renal disease: Secondary | ICD-10-CM | POA: Diagnosis not present

## 2017-10-13 DIAGNOSIS — N2581 Secondary hyperparathyroidism of renal origin: Secondary | ICD-10-CM | POA: Diagnosis not present

## 2017-10-14 DIAGNOSIS — I2510 Atherosclerotic heart disease of native coronary artery without angina pectoris: Secondary | ICD-10-CM | POA: Diagnosis not present

## 2017-10-14 DIAGNOSIS — T8140XD Infection following a procedure, unspecified, subsequent encounter: Secondary | ICD-10-CM | POA: Diagnosis not present

## 2017-10-14 DIAGNOSIS — S065X0S Traumatic subdural hemorrhage without loss of consciousness, sequela: Secondary | ICD-10-CM | POA: Diagnosis not present

## 2017-10-14 DIAGNOSIS — I69393 Ataxia following cerebral infarction: Secondary | ICD-10-CM | POA: Diagnosis not present

## 2017-10-14 DIAGNOSIS — G8191 Hemiplegia, unspecified affecting right dominant side: Secondary | ICD-10-CM | POA: Diagnosis not present

## 2017-10-14 DIAGNOSIS — I1311 Hypertensive heart and chronic kidney disease without heart failure, with stage 5 chronic kidney disease, or end stage renal disease: Secondary | ICD-10-CM | POA: Diagnosis not present

## 2017-10-15 DIAGNOSIS — N186 End stage renal disease: Secondary | ICD-10-CM | POA: Diagnosis not present

## 2017-10-15 DIAGNOSIS — N2581 Secondary hyperparathyroidism of renal origin: Secondary | ICD-10-CM | POA: Diagnosis not present

## 2017-10-15 DIAGNOSIS — E1122 Type 2 diabetes mellitus with diabetic chronic kidney disease: Secondary | ICD-10-CM | POA: Diagnosis not present

## 2017-10-15 DIAGNOSIS — Z992 Dependence on renal dialysis: Secondary | ICD-10-CM | POA: Diagnosis not present

## 2017-10-15 DIAGNOSIS — D509 Iron deficiency anemia, unspecified: Secondary | ICD-10-CM | POA: Diagnosis not present

## 2017-10-15 DIAGNOSIS — D631 Anemia in chronic kidney disease: Secondary | ICD-10-CM | POA: Diagnosis not present

## 2017-10-16 DIAGNOSIS — T8140XD Infection following a procedure, unspecified, subsequent encounter: Secondary | ICD-10-CM | POA: Diagnosis not present

## 2017-10-16 DIAGNOSIS — S065X0S Traumatic subdural hemorrhage without loss of consciousness, sequela: Secondary | ICD-10-CM | POA: Diagnosis not present

## 2017-10-16 DIAGNOSIS — I2510 Atherosclerotic heart disease of native coronary artery without angina pectoris: Secondary | ICD-10-CM | POA: Diagnosis not present

## 2017-10-16 DIAGNOSIS — I69393 Ataxia following cerebral infarction: Secondary | ICD-10-CM | POA: Diagnosis not present

## 2017-10-16 DIAGNOSIS — G8191 Hemiplegia, unspecified affecting right dominant side: Secondary | ICD-10-CM | POA: Diagnosis not present

## 2017-10-16 DIAGNOSIS — I1311 Hypertensive heart and chronic kidney disease without heart failure, with stage 5 chronic kidney disease, or end stage renal disease: Secondary | ICD-10-CM | POA: Diagnosis not present

## 2017-10-18 DIAGNOSIS — E1122 Type 2 diabetes mellitus with diabetic chronic kidney disease: Secondary | ICD-10-CM | POA: Diagnosis not present

## 2017-10-18 DIAGNOSIS — D631 Anemia in chronic kidney disease: Secondary | ICD-10-CM | POA: Diagnosis not present

## 2017-10-18 DIAGNOSIS — D509 Iron deficiency anemia, unspecified: Secondary | ICD-10-CM | POA: Diagnosis not present

## 2017-10-18 DIAGNOSIS — Z992 Dependence on renal dialysis: Secondary | ICD-10-CM | POA: Diagnosis not present

## 2017-10-18 DIAGNOSIS — N186 End stage renal disease: Secondary | ICD-10-CM | POA: Diagnosis not present

## 2017-10-18 DIAGNOSIS — N2581 Secondary hyperparathyroidism of renal origin: Secondary | ICD-10-CM | POA: Diagnosis not present

## 2017-10-19 DIAGNOSIS — T8140XD Infection following a procedure, unspecified, subsequent encounter: Secondary | ICD-10-CM | POA: Diagnosis not present

## 2017-10-19 DIAGNOSIS — S065X0S Traumatic subdural hemorrhage without loss of consciousness, sequela: Secondary | ICD-10-CM | POA: Diagnosis not present

## 2017-10-19 DIAGNOSIS — I2510 Atherosclerotic heart disease of native coronary artery without angina pectoris: Secondary | ICD-10-CM | POA: Diagnosis not present

## 2017-10-19 DIAGNOSIS — I69393 Ataxia following cerebral infarction: Secondary | ICD-10-CM | POA: Diagnosis not present

## 2017-10-19 DIAGNOSIS — I1311 Hypertensive heart and chronic kidney disease without heart failure, with stage 5 chronic kidney disease, or end stage renal disease: Secondary | ICD-10-CM | POA: Diagnosis not present

## 2017-10-19 DIAGNOSIS — G8191 Hemiplegia, unspecified affecting right dominant side: Secondary | ICD-10-CM | POA: Diagnosis not present

## 2017-10-20 DIAGNOSIS — D509 Iron deficiency anemia, unspecified: Secondary | ICD-10-CM | POA: Diagnosis not present

## 2017-10-20 DIAGNOSIS — E1122 Type 2 diabetes mellitus with diabetic chronic kidney disease: Secondary | ICD-10-CM | POA: Diagnosis not present

## 2017-10-20 DIAGNOSIS — Z992 Dependence on renal dialysis: Secondary | ICD-10-CM | POA: Diagnosis not present

## 2017-10-20 DIAGNOSIS — N186 End stage renal disease: Secondary | ICD-10-CM | POA: Diagnosis not present

## 2017-10-20 DIAGNOSIS — N2581 Secondary hyperparathyroidism of renal origin: Secondary | ICD-10-CM | POA: Diagnosis not present

## 2017-10-20 DIAGNOSIS — D631 Anemia in chronic kidney disease: Secondary | ICD-10-CM | POA: Diagnosis not present

## 2017-10-21 DIAGNOSIS — I1311 Hypertensive heart and chronic kidney disease without heart failure, with stage 5 chronic kidney disease, or end stage renal disease: Secondary | ICD-10-CM | POA: Diagnosis not present

## 2017-10-21 DIAGNOSIS — G8191 Hemiplegia, unspecified affecting right dominant side: Secondary | ICD-10-CM | POA: Diagnosis not present

## 2017-10-21 DIAGNOSIS — T8140XD Infection following a procedure, unspecified, subsequent encounter: Secondary | ICD-10-CM | POA: Diagnosis not present

## 2017-10-21 DIAGNOSIS — I69393 Ataxia following cerebral infarction: Secondary | ICD-10-CM | POA: Diagnosis not present

## 2017-10-21 DIAGNOSIS — S065X0S Traumatic subdural hemorrhage without loss of consciousness, sequela: Secondary | ICD-10-CM | POA: Diagnosis not present

## 2017-10-21 DIAGNOSIS — I2510 Atherosclerotic heart disease of native coronary artery without angina pectoris: Secondary | ICD-10-CM | POA: Diagnosis not present

## 2017-10-22 DIAGNOSIS — N186 End stage renal disease: Secondary | ICD-10-CM | POA: Diagnosis not present

## 2017-10-22 DIAGNOSIS — D631 Anemia in chronic kidney disease: Secondary | ICD-10-CM | POA: Diagnosis not present

## 2017-10-22 DIAGNOSIS — E1122 Type 2 diabetes mellitus with diabetic chronic kidney disease: Secondary | ICD-10-CM | POA: Diagnosis not present

## 2017-10-22 DIAGNOSIS — Z992 Dependence on renal dialysis: Secondary | ICD-10-CM | POA: Diagnosis not present

## 2017-10-22 DIAGNOSIS — N2581 Secondary hyperparathyroidism of renal origin: Secondary | ICD-10-CM | POA: Diagnosis not present

## 2017-10-22 DIAGNOSIS — D509 Iron deficiency anemia, unspecified: Secondary | ICD-10-CM | POA: Diagnosis not present

## 2017-10-25 DIAGNOSIS — E1122 Type 2 diabetes mellitus with diabetic chronic kidney disease: Secondary | ICD-10-CM | POA: Diagnosis not present

## 2017-10-25 DIAGNOSIS — G8191 Hemiplegia, unspecified affecting right dominant side: Secondary | ICD-10-CM | POA: Diagnosis not present

## 2017-10-25 DIAGNOSIS — N2581 Secondary hyperparathyroidism of renal origin: Secondary | ICD-10-CM | POA: Diagnosis not present

## 2017-10-25 DIAGNOSIS — D509 Iron deficiency anemia, unspecified: Secondary | ICD-10-CM | POA: Diagnosis not present

## 2017-10-25 DIAGNOSIS — Z992 Dependence on renal dialysis: Secondary | ICD-10-CM | POA: Diagnosis not present

## 2017-10-25 DIAGNOSIS — S065X0S Traumatic subdural hemorrhage without loss of consciousness, sequela: Secondary | ICD-10-CM | POA: Diagnosis not present

## 2017-10-25 DIAGNOSIS — T8140XD Infection following a procedure, unspecified, subsequent encounter: Secondary | ICD-10-CM | POA: Diagnosis not present

## 2017-10-25 DIAGNOSIS — E876 Hypokalemia: Secondary | ICD-10-CM | POA: Diagnosis not present

## 2017-10-25 DIAGNOSIS — N186 End stage renal disease: Secondary | ICD-10-CM | POA: Diagnosis not present

## 2017-10-25 DIAGNOSIS — I69393 Ataxia following cerebral infarction: Secondary | ICD-10-CM | POA: Diagnosis not present

## 2017-10-25 DIAGNOSIS — I2510 Atherosclerotic heart disease of native coronary artery without angina pectoris: Secondary | ICD-10-CM | POA: Diagnosis not present

## 2017-10-25 DIAGNOSIS — D631 Anemia in chronic kidney disease: Secondary | ICD-10-CM | POA: Diagnosis not present

## 2017-10-25 DIAGNOSIS — I1311 Hypertensive heart and chronic kidney disease without heart failure, with stage 5 chronic kidney disease, or end stage renal disease: Secondary | ICD-10-CM | POA: Diagnosis not present

## 2017-10-25 DIAGNOSIS — I159 Secondary hypertension, unspecified: Secondary | ICD-10-CM | POA: Diagnosis not present

## 2017-10-26 DIAGNOSIS — T8140XD Infection following a procedure, unspecified, subsequent encounter: Secondary | ICD-10-CM | POA: Diagnosis not present

## 2017-10-26 DIAGNOSIS — G8191 Hemiplegia, unspecified affecting right dominant side: Secondary | ICD-10-CM | POA: Diagnosis not present

## 2017-10-26 DIAGNOSIS — S065X0S Traumatic subdural hemorrhage without loss of consciousness, sequela: Secondary | ICD-10-CM | POA: Diagnosis not present

## 2017-10-26 DIAGNOSIS — I69393 Ataxia following cerebral infarction: Secondary | ICD-10-CM | POA: Diagnosis not present

## 2017-10-26 DIAGNOSIS — I2510 Atherosclerotic heart disease of native coronary artery without angina pectoris: Secondary | ICD-10-CM | POA: Diagnosis not present

## 2017-10-26 DIAGNOSIS — I1311 Hypertensive heart and chronic kidney disease without heart failure, with stage 5 chronic kidney disease, or end stage renal disease: Secondary | ICD-10-CM | POA: Diagnosis not present

## 2017-10-27 DIAGNOSIS — D631 Anemia in chronic kidney disease: Secondary | ICD-10-CM | POA: Diagnosis not present

## 2017-10-27 DIAGNOSIS — D509 Iron deficiency anemia, unspecified: Secondary | ICD-10-CM | POA: Diagnosis not present

## 2017-10-27 DIAGNOSIS — N186 End stage renal disease: Secondary | ICD-10-CM | POA: Diagnosis not present

## 2017-10-27 DIAGNOSIS — E876 Hypokalemia: Secondary | ICD-10-CM | POA: Diagnosis not present

## 2017-10-27 DIAGNOSIS — E1122 Type 2 diabetes mellitus with diabetic chronic kidney disease: Secondary | ICD-10-CM | POA: Diagnosis not present

## 2017-10-27 DIAGNOSIS — N2581 Secondary hyperparathyroidism of renal origin: Secondary | ICD-10-CM | POA: Diagnosis not present

## 2017-10-28 DIAGNOSIS — S065X0S Traumatic subdural hemorrhage without loss of consciousness, sequela: Secondary | ICD-10-CM | POA: Diagnosis not present

## 2017-10-28 DIAGNOSIS — I2510 Atherosclerotic heart disease of native coronary artery without angina pectoris: Secondary | ICD-10-CM | POA: Diagnosis not present

## 2017-10-28 DIAGNOSIS — I1311 Hypertensive heart and chronic kidney disease without heart failure, with stage 5 chronic kidney disease, or end stage renal disease: Secondary | ICD-10-CM | POA: Diagnosis not present

## 2017-10-28 DIAGNOSIS — G8191 Hemiplegia, unspecified affecting right dominant side: Secondary | ICD-10-CM | POA: Diagnosis not present

## 2017-10-28 DIAGNOSIS — I69393 Ataxia following cerebral infarction: Secondary | ICD-10-CM | POA: Diagnosis not present

## 2017-10-28 DIAGNOSIS — T8140XD Infection following a procedure, unspecified, subsequent encounter: Secondary | ICD-10-CM | POA: Diagnosis not present

## 2017-10-29 DIAGNOSIS — N2581 Secondary hyperparathyroidism of renal origin: Secondary | ICD-10-CM | POA: Diagnosis not present

## 2017-10-29 DIAGNOSIS — D631 Anemia in chronic kidney disease: Secondary | ICD-10-CM | POA: Diagnosis not present

## 2017-10-29 DIAGNOSIS — E876 Hypokalemia: Secondary | ICD-10-CM | POA: Diagnosis not present

## 2017-10-29 DIAGNOSIS — N186 End stage renal disease: Secondary | ICD-10-CM | POA: Diagnosis not present

## 2017-10-29 DIAGNOSIS — E1122 Type 2 diabetes mellitus with diabetic chronic kidney disease: Secondary | ICD-10-CM | POA: Diagnosis not present

## 2017-10-29 DIAGNOSIS — D509 Iron deficiency anemia, unspecified: Secondary | ICD-10-CM | POA: Diagnosis not present

## 2017-11-01 DIAGNOSIS — N186 End stage renal disease: Secondary | ICD-10-CM | POA: Diagnosis not present

## 2017-11-01 DIAGNOSIS — D631 Anemia in chronic kidney disease: Secondary | ICD-10-CM | POA: Diagnosis not present

## 2017-11-01 DIAGNOSIS — D509 Iron deficiency anemia, unspecified: Secondary | ICD-10-CM | POA: Diagnosis not present

## 2017-11-01 DIAGNOSIS — N2581 Secondary hyperparathyroidism of renal origin: Secondary | ICD-10-CM | POA: Diagnosis not present

## 2017-11-01 DIAGNOSIS — E876 Hypokalemia: Secondary | ICD-10-CM | POA: Diagnosis not present

## 2017-11-01 DIAGNOSIS — E1122 Type 2 diabetes mellitus with diabetic chronic kidney disease: Secondary | ICD-10-CM | POA: Diagnosis not present

## 2017-11-03 DIAGNOSIS — N2581 Secondary hyperparathyroidism of renal origin: Secondary | ICD-10-CM | POA: Diagnosis not present

## 2017-11-03 DIAGNOSIS — N186 End stage renal disease: Secondary | ICD-10-CM | POA: Diagnosis not present

## 2017-11-03 DIAGNOSIS — D509 Iron deficiency anemia, unspecified: Secondary | ICD-10-CM | POA: Diagnosis not present

## 2017-11-03 DIAGNOSIS — D631 Anemia in chronic kidney disease: Secondary | ICD-10-CM | POA: Diagnosis not present

## 2017-11-03 DIAGNOSIS — E876 Hypokalemia: Secondary | ICD-10-CM | POA: Diagnosis not present

## 2017-11-03 DIAGNOSIS — E1122 Type 2 diabetes mellitus with diabetic chronic kidney disease: Secondary | ICD-10-CM | POA: Diagnosis not present

## 2017-11-05 DIAGNOSIS — E876 Hypokalemia: Secondary | ICD-10-CM | POA: Diagnosis not present

## 2017-11-05 DIAGNOSIS — N2581 Secondary hyperparathyroidism of renal origin: Secondary | ICD-10-CM | POA: Diagnosis not present

## 2017-11-05 DIAGNOSIS — D509 Iron deficiency anemia, unspecified: Secondary | ICD-10-CM | POA: Diagnosis not present

## 2017-11-05 DIAGNOSIS — N186 End stage renal disease: Secondary | ICD-10-CM | POA: Diagnosis not present

## 2017-11-05 DIAGNOSIS — E1122 Type 2 diabetes mellitus with diabetic chronic kidney disease: Secondary | ICD-10-CM | POA: Diagnosis not present

## 2017-11-05 DIAGNOSIS — D631 Anemia in chronic kidney disease: Secondary | ICD-10-CM | POA: Diagnosis not present

## 2017-11-08 DIAGNOSIS — D631 Anemia in chronic kidney disease: Secondary | ICD-10-CM | POA: Diagnosis not present

## 2017-11-08 DIAGNOSIS — E876 Hypokalemia: Secondary | ICD-10-CM | POA: Diagnosis not present

## 2017-11-08 DIAGNOSIS — N186 End stage renal disease: Secondary | ICD-10-CM | POA: Diagnosis not present

## 2017-11-08 DIAGNOSIS — E1122 Type 2 diabetes mellitus with diabetic chronic kidney disease: Secondary | ICD-10-CM | POA: Diagnosis not present

## 2017-11-08 DIAGNOSIS — N2581 Secondary hyperparathyroidism of renal origin: Secondary | ICD-10-CM | POA: Diagnosis not present

## 2017-11-08 DIAGNOSIS — D509 Iron deficiency anemia, unspecified: Secondary | ICD-10-CM | POA: Diagnosis not present

## 2017-11-09 ENCOUNTER — Ambulatory Visit (INDEPENDENT_AMBULATORY_CARE_PROVIDER_SITE_OTHER): Payer: Medicare Other | Admitting: Orthopedic Surgery

## 2017-11-09 ENCOUNTER — Encounter (INDEPENDENT_AMBULATORY_CARE_PROVIDER_SITE_OTHER): Payer: Self-pay | Admitting: Orthopedic Surgery

## 2017-11-09 DIAGNOSIS — M79671 Pain in right foot: Secondary | ICD-10-CM

## 2017-11-09 DIAGNOSIS — Z89432 Acquired absence of left foot: Secondary | ICD-10-CM | POA: Diagnosis not present

## 2017-11-09 DIAGNOSIS — M6701 Short Achilles tendon (acquired), right ankle: Secondary | ICD-10-CM | POA: Diagnosis not present

## 2017-11-09 DIAGNOSIS — B351 Tinea unguium: Secondary | ICD-10-CM | POA: Diagnosis not present

## 2017-11-09 DIAGNOSIS — L97511 Non-pressure chronic ulcer of other part of right foot limited to breakdown of skin: Secondary | ICD-10-CM

## 2017-11-09 DIAGNOSIS — I7025 Atherosclerosis of native arteries of other extremities with ulceration: Secondary | ICD-10-CM | POA: Diagnosis not present

## 2017-11-09 NOTE — Progress Notes (Signed)
Office Visit Note   Patient: Destiny Day           Date of Birth: 27-Feb-1951           MRN: [id number] Visit Date: 11/09/2017              Requested by: Velna Hatchet, MD [address] [place], [place] [address] PCP: Velna Hatchet, MD  Chief Complaint  Patient presents with  . Left Foot - Pain, Follow-up      HPI: Patient is a 67 year old woman who presents for follow-up of both feet she states that she is noticed a small ulcer over the tip of the right foot second toe she is status post partial amputation of the great toe.  She is status post revascularization surgery to the right lower extremity.  She is status post left transmetatarsal amputation she has custom orthotics extra-depth shoes she states that [employer] can no longer provide her with extra-depth shoes.  Assessment & Plan: Visit Diagnoses:  1. S/P transmetatarsal amputation of foot, left ([place])   2. Onychomycosis   3. Pain in right foot   4. Achilles tendon contracture, right   5. Ulcer of toe of right foot, limited to breakdown of skin ([place])     Plan: Recommend that she go to the shoe market to get a pair of extra-depth new balance sneakers.  Her orthotics should work in the shoes.  For the venous insufficiency recommend she go to [facility] to obtain a pair of 15-20 mm compression knee-high stockings she is to wear these daily.  Follow-Up Instructions: Return in about 3 weeks (around 11/30/2017).   Ortho Exam  Patient is alert, oriented, no adenopathy, well-dressed, normal affect, normal respiratory effort. Examination patient has a superficial Waggoner grade 1 ulcer on the tip of the right second toe.  This was debrided of skin and soft tissue the ulcer did not probe to bone the ulcers 7 mm in diameter 1 mm deep.  Patient has significant heel cord contracture on the right with dorsiflexion about 20 degrees short of neutral with her knee extended.  She has fixed clawing of the lesser toes  status post partial great toe amputation which is healed well.  Examination of the left foot she has a stable transmetatarsal amputation and states she does have some neuropathic pain which she takes Neurontin at night for.  After informed consent the nails were trimmed x4 without complications.  Patient's foot is warm good capillary refill no ischemic changes no cellulitis no signs of infection.  Patient was given instruction and demonstrated heel cord stretching to do 5 times a day 1 minute at a time.  Patient has venous insufficiency to the right lower extremity with pitting edema up to the tibial tubercle there is brawny skin color changes but no cellulitis.  No drainage.  Imaging: No results found. No images are attached to the encounter.  Labs: Lab Results  Component Value Date   HGBA1C 5.0 09/03/2017   HGBA1C 7.0 (H) 08/11/2017   HGBA1C 7.5 (H) 06/24/2016   REPTSTATUS 08/25/2017 FINAL 08/24/2017   CULT NO GROWTH 08/24/2017   LABORGA STAPHYLOCOCCUS AUREUS 11/29/2008    @LABSALLVALUES (HGBA1)@  There is no height or weight on file to calculate BMI.  Orders:  No orders of the defined types were placed in this encounter.  No orders of the defined types were placed in this encounter.    Procedures: No procedures performed  Clinical Data: No additional findings.  ROS:  All other systems negative, except as noted in the HPI. Review of Systems  Objective: Vital Signs: There were no vitals taken for this visit.  Specialty Comments:  No specialty comments available.  PMFS History: Patient Active Problem List   Diagnosis Date Noted  . Ulcer of toe of right foot, limited to breakdown of skin ([place]) 11/09/2017  . Achilles tendon contracture, right 11/09/2017  . Labile blood glucose   . Anemia of infection and chronic disease   . Essential hypertension   . Black stool   . Right sided weakness   . Subdural hemorrhage following injury ([place]) 08/30/2017  . Diabetic  peripheral neuropathy ([place])   . Chronic obstructive pulmonary disease ([place])   . Seizure prophylaxis   . Subdural hematoma ([place]) 08/24/2017  . Traumatic subdural hematoma ([place]) 08/23/2017  . Fall   . SDH (subdural hematoma) ([place])   . Acute respiratory failure with hypoxia ([place])   . Diabetes mellitus type 2 in nonobese (HCC)   . Leukocytosis   . Labile blood pressure   . Generalized anxiety disorder   . Debility 08/16/2017  . Amputated great toe of right foot ([place])   . Amputated toe of left foot ([place])   . Post-operative pain   . ESRD on dialysis ([place])   . Diabetes mellitus ([place])   . Anxiety state   . Benign essential HTN   . Acute blood loss anemia   . Anemia of chronic disease   . Idiopathic chronic venous hypertension of both lower extremities with inflammation 10/13/2016  . S/P transmetatarsal amputation of foot, left ([place]) 09/21/2016  . Onychomycosis 08/15/2016  . Gangrene of toe of left foot ([place])   . PAD (peripheral artery disease) ([place]) 07/08/2016  . Atherosclerosis of native artery of left lower extremity with gangrene ([place]) 06/23/2016  . GERD (gastroesophageal reflux disease) 10/07/2015  . ARF (acute renal failure) ([place])   . Hypothermia 06/12/2015  . Acute on chronic renal failure ([place]) 06/12/2015  . CHF (congestive heart failure) ([place]) 07/30/2014  . CKD (chronic kidney disease) stage 4, GFR 15-29 ml/min (HCC) 06/27/2014  . Hypertensive heart disease 05/25/2013  . CKD (chronic kidney disease) stage 3, GFR 30-59 ml/min (HCC) 05/25/2013  . Pulmonary edema 05/23/2013  . Type 2 diabetes mellitus with diabetic nephropathy ([place]) 05/23/2013  . Type 2 diabetes mellitus with hyperosmolar nonketotic hyperglycemia ([place]) 04/13/2013  . Chronic diastolic CHF (congestive heart failure) ([place]) 04/13/2013  . UGI bleed 03/31/2013  . Hypokalemia 08/24/2012  . Type II or unspecified type diabetes mellitus without mention of complication, not stated as uncontrolled 12/27/2007  . HLD  (hyperlipidemia) 12/27/2007  . ALLERGIC RHINITIS 12/27/2007  . Asthma 12/27/2007   Past Medical History:  Diagnosis Date  . Abdominal bruit   . Anemia   . Anxiety   . Arthritis    Osteoarthritis  . Asthma   . Cervical disc disease    "pinced nerve"  . CHF (congestive heart failure) ([place])   . Complication of anesthesia    " after I got home from my last procedure, I started itching."  . Diabetes mellitus    Type II  . Diverticulitis   . ESRD on peritoneal dialysis [facility])    Hemodialysis - MWF- [place]   . GERD (gastroesophageal reflux disease)    from medications  . GI bleed 03/31/2013  . History of hiatal hernia   . Hyperlipidemia   . Hypertension   . Neuropathy    left leg  . Osteoporosis   .  Peripheral vascular disease ([place])   . Pneumonia    "very young"  . Seasonal allergies   . Shortness of breath dyspnea    WIth exertion  . Sleep apnea    can't afford cpap    Family History  Problem Relation Age of Onset  . Other Mother        not sure of cause of death  . Diabetes Father   . Pancreatic cancer Maternal Grandmother   . Colon cancer Neg Hx     Past Surgical History:  Procedure Laterality Date  . A/V SHUNTOGRAM N/A 09/22/2016   Procedure: A/V Shuntogram - left arm;  Surgeon: Serafina Mitchell, MD;  Location: [place] [facility];  Service: Cardiovascular;  Laterality: N/A;  . ABDOMINAL HYSTERECTOMY  1993`  . AMPUTATION Left 09/01/2016   Procedure: LEFT FOOT TRANSMETATARSAL AMPUTATION;  Surgeon: Newt Minion, MD;  Location: [place];  Service: Orthopedics;  Laterality: Left;  . AMPUTATION Right 08/11/2017   Procedure: RIGHT GREAT TOE AMPUTATION DIGIT;  Surgeon: Rosetta Posner, MD;  Location: [facility] OR;  Service: Vascular;  Laterality: Right;  . AV FISTULA PLACEMENT Left 04/21/2016   Procedure: INSERTION OF ARTERIOVENOUS (AV) GORE-TEX GRAFT ARM LEFT;  Surgeon: Elam Dutch, MD;  Location: [place];  Service: Vascular;  Laterality: Left;  . [place] TRANSPOSITION Left  07/10/2014   Procedure: BASCILIC VEIN TRANSPOSITION;  Surgeon: Angelia Mould, MD;  Location: [place];  Service: Vascular;  Laterality: Left;  . BASCILIC VEIN TRANSPOSITION Right 11/08/2014   Procedure: FIRST STAGE BASILIC VEIN TRANSPOSITION;  Surgeon: Angelia Mould, MD;  Location: [place];  Service: Vascular;  Laterality: Right;  . BASCILIC VEIN TRANSPOSITION Right 01/18/2015   Procedure: SECOND STAGE BASILIC VEIN TRANSPOSITION;  Surgeon: Angelia Mould, MD;  Location: [place];  Service: Vascular;  Laterality: Right;  . CRANIOTOMY N/A 08/23/2017   Procedure: CRANIOTOMY HEMATOMA EVACUATION SUBDURAL;  Surgeon: Ashok Pall, MD;  Location: [place];  Service: Neurosurgery;  Laterality: N/A;  . CRANIOTOMY Left 08/24/2017   Procedure: CRANIOTOMY FOR RECURRENT ACUTE SUBDURAL HEMATOMA;  Surgeon: Ashok Pall, MD;  Location: [place];  Service: Neurosurgery;  Laterality: Left;  . ESOPHAGOGASTRODUODENOSCOPY N/A 03/31/2013   Procedure: ESOPHAGOGASTRODUODENOSCOPY (EGD);  Surgeon: Gatha Mayer, MD;  Location: [facility] ENDOSCOPY;  Service: Endoscopy;  Laterality: N/A;  . EYE SURGERY     laser surgery  . FEMORAL-POPLITEAL BYPASS GRAFT Left 07/08/2016   Procedure: LEFT  FEMORAL-BELOW KNEE POPLITEAL ARTERY BYPASS GRAFT USING 6MM X 80 CM PROPATEN GORETEX GRAFT WITH RINGS.;  Surgeon: Rosetta Posner, MD;  Location: [place];  Service: Vascular;  Laterality: Left;  . FEMORAL-POPLITEAL BYPASS GRAFT Right 08/11/2017   Procedure: RIGHT FEMORAL TO BELOW KNEE POPLITEAL ARTERKY  BYPASS GRAFT USING 6MM RINGED PROPATEN GRAFT;  Surgeon: Rosetta Posner, MD;  Location: [place];  Service: Vascular;  Laterality: Right;  . FISTULOGRAM Left 10/29/2014   Procedure: FISTULOGRAM;  Surgeon: Angelia Mould, MD;  Location: [facility] CATH LAB;  Service: Cardiovascular;  Laterality: Left;  . LIGATION OF ARTERIOVENOUS  FISTULA Left 04/21/2016   Procedure: LIGATION OF ARTERIOVENOUS  FISTULA LEFT ARM;  Surgeon: Elam Dutch, MD;  Location: [place];  Service: Vascular;  Laterality: Left;  . LOWER EXTREMITY ANGIOGRAPHY N/A 04/06/2017   Procedure: Lower Extremity Angiography - Right;  Surgeon: Serafina Mitchell, MD;  Location: [place] CV LAB;  Service: Cardiovascular;  Laterality: N/A;  . PATCH ANGIOPLASTY Right 01/18/2015   Procedure: BASILIC VEIN  PATCH ANGIOPLASTY USING VASCUGUARD PATCH;  Surgeon: Angelia Mould, MD;  Location: [place];  Service: Vascular;  Laterality: Right;  . PERIPHERAL VASCULAR BALLOON ANGIOPLASTY  09/22/2016   Procedure: Peripheral Vascular Balloon Angioplasty;  Surgeon: Serafina Mitchell, MD;  Location: [place] [facility] LAB;  Service: Cardiovascular;;  Lt. Fistula  . PERIPHERAL VASCULAR CATHETERIZATION N/A 06/23/2016   Procedure: Abdominal Aortogram w/Lower Extremity;  Surgeon: Serafina Mitchell, MD;  Location: [place] CV LAB;  Service: Cardiovascular;  Laterality: N/A;  . PERIPHERAL VASCULAR CATHETERIZATION  06/23/2016   Procedure: Peripheral Vascular Intervention;  Surgeon: Serafina Mitchell, MD;  Location: [place] CV LAB;  Service: Cardiovascular;;  lt common and external illiac artery   Social History   Occupational History  . Occupation: Retired  Tobacco Use  . Smoking status: Former Smoker    Packs/day: 0.35    Years: 40.00    Pack years: 14.00    Types: Cigarettes    Last attempt to quit: 05/10/2012    Years since quitting: 5.5  . Smokeless tobacco: Never Used  Substance and Sexual Activity  . Alcohol use: No  . Drug use: No    Comment: marijuana; quit in early 1980's  . Sexual activity: Yes

## 2017-11-10 DIAGNOSIS — D509 Iron deficiency anemia, unspecified: Secondary | ICD-10-CM | POA: Diagnosis not present

## 2017-11-10 DIAGNOSIS — E1122 Type 2 diabetes mellitus with diabetic chronic kidney disease: Secondary | ICD-10-CM | POA: Diagnosis not present

## 2017-11-10 DIAGNOSIS — N2581 Secondary hyperparathyroidism of renal origin: Secondary | ICD-10-CM | POA: Diagnosis not present

## 2017-11-10 DIAGNOSIS — E876 Hypokalemia: Secondary | ICD-10-CM | POA: Diagnosis not present

## 2017-11-10 DIAGNOSIS — N186 End stage renal disease: Secondary | ICD-10-CM | POA: Diagnosis not present

## 2017-11-10 DIAGNOSIS — D631 Anemia in chronic kidney disease: Secondary | ICD-10-CM | POA: Diagnosis not present

## 2017-11-11 ENCOUNTER — Ambulatory Visit: Payer: Medicare Other | Admitting: Physical Medicine & Rehabilitation

## 2017-11-12 DIAGNOSIS — N2581 Secondary hyperparathyroidism of renal origin: Secondary | ICD-10-CM | POA: Diagnosis not present

## 2017-11-12 DIAGNOSIS — E1122 Type 2 diabetes mellitus with diabetic chronic kidney disease: Secondary | ICD-10-CM | POA: Diagnosis not present

## 2017-11-12 DIAGNOSIS — E876 Hypokalemia: Secondary | ICD-10-CM | POA: Diagnosis not present

## 2017-11-12 DIAGNOSIS — D509 Iron deficiency anemia, unspecified: Secondary | ICD-10-CM | POA: Diagnosis not present

## 2017-11-12 DIAGNOSIS — N186 End stage renal disease: Secondary | ICD-10-CM | POA: Diagnosis not present

## 2017-11-12 DIAGNOSIS — D631 Anemia in chronic kidney disease: Secondary | ICD-10-CM | POA: Diagnosis not present

## 2017-11-15 DIAGNOSIS — D509 Iron deficiency anemia, unspecified: Secondary | ICD-10-CM | POA: Diagnosis not present

## 2017-11-15 DIAGNOSIS — D631 Anemia in chronic kidney disease: Secondary | ICD-10-CM | POA: Diagnosis not present

## 2017-11-15 DIAGNOSIS — N186 End stage renal disease: Secondary | ICD-10-CM | POA: Diagnosis not present

## 2017-11-15 DIAGNOSIS — E876 Hypokalemia: Secondary | ICD-10-CM | POA: Diagnosis not present

## 2017-11-15 DIAGNOSIS — N2581 Secondary hyperparathyroidism of renal origin: Secondary | ICD-10-CM | POA: Diagnosis not present

## 2017-11-15 DIAGNOSIS — E1122 Type 2 diabetes mellitus with diabetic chronic kidney disease: Secondary | ICD-10-CM | POA: Diagnosis not present

## 2017-11-17 DIAGNOSIS — D509 Iron deficiency anemia, unspecified: Secondary | ICD-10-CM | POA: Diagnosis not present

## 2017-11-17 DIAGNOSIS — E1122 Type 2 diabetes mellitus with diabetic chronic kidney disease: Secondary | ICD-10-CM | POA: Diagnosis not present

## 2017-11-17 DIAGNOSIS — N186 End stage renal disease: Secondary | ICD-10-CM | POA: Diagnosis not present

## 2017-11-17 DIAGNOSIS — N2581 Secondary hyperparathyroidism of renal origin: Secondary | ICD-10-CM | POA: Diagnosis not present

## 2017-11-17 DIAGNOSIS — D631 Anemia in chronic kidney disease: Secondary | ICD-10-CM | POA: Diagnosis not present

## 2017-11-17 DIAGNOSIS — E876 Hypokalemia: Secondary | ICD-10-CM | POA: Diagnosis not present

## 2017-11-19 DIAGNOSIS — E1122 Type 2 diabetes mellitus with diabetic chronic kidney disease: Secondary | ICD-10-CM | POA: Diagnosis not present

## 2017-11-19 DIAGNOSIS — N186 End stage renal disease: Secondary | ICD-10-CM | POA: Diagnosis not present

## 2017-11-19 DIAGNOSIS — D509 Iron deficiency anemia, unspecified: Secondary | ICD-10-CM | POA: Diagnosis not present

## 2017-11-19 DIAGNOSIS — D631 Anemia in chronic kidney disease: Secondary | ICD-10-CM | POA: Diagnosis not present

## 2017-11-19 DIAGNOSIS — N2581 Secondary hyperparathyroidism of renal origin: Secondary | ICD-10-CM | POA: Diagnosis not present

## 2017-11-19 DIAGNOSIS — E876 Hypokalemia: Secondary | ICD-10-CM | POA: Diagnosis not present

## 2017-11-22 DIAGNOSIS — D631 Anemia in chronic kidney disease: Secondary | ICD-10-CM | POA: Diagnosis not present

## 2017-11-22 DIAGNOSIS — E1122 Type 2 diabetes mellitus with diabetic chronic kidney disease: Secondary | ICD-10-CM | POA: Diagnosis not present

## 2017-11-22 DIAGNOSIS — D509 Iron deficiency anemia, unspecified: Secondary | ICD-10-CM | POA: Diagnosis not present

## 2017-11-22 DIAGNOSIS — N2581 Secondary hyperparathyroidism of renal origin: Secondary | ICD-10-CM | POA: Diagnosis not present

## 2017-11-22 DIAGNOSIS — E876 Hypokalemia: Secondary | ICD-10-CM | POA: Diagnosis not present

## 2017-11-22 DIAGNOSIS — N186 End stage renal disease: Secondary | ICD-10-CM | POA: Diagnosis not present

## 2017-11-24 DIAGNOSIS — N186 End stage renal disease: Secondary | ICD-10-CM | POA: Diagnosis not present

## 2017-11-24 DIAGNOSIS — E876 Hypokalemia: Secondary | ICD-10-CM | POA: Diagnosis not present

## 2017-11-24 DIAGNOSIS — D631 Anemia in chronic kidney disease: Secondary | ICD-10-CM | POA: Diagnosis not present

## 2017-11-24 DIAGNOSIS — Z992 Dependence on renal dialysis: Secondary | ICD-10-CM | POA: Diagnosis not present

## 2017-11-24 DIAGNOSIS — D509 Iron deficiency anemia, unspecified: Secondary | ICD-10-CM | POA: Diagnosis not present

## 2017-11-24 DIAGNOSIS — I159 Secondary hypertension, unspecified: Secondary | ICD-10-CM | POA: Diagnosis not present

## 2017-11-24 DIAGNOSIS — N2581 Secondary hyperparathyroidism of renal origin: Secondary | ICD-10-CM | POA: Diagnosis not present

## 2017-11-24 DIAGNOSIS — E1122 Type 2 diabetes mellitus with diabetic chronic kidney disease: Secondary | ICD-10-CM | POA: Diagnosis not present

## 2017-11-25 ENCOUNTER — Ambulatory Visit (INDEPENDENT_AMBULATORY_CARE_PROVIDER_SITE_OTHER): Payer: Medicare Other | Admitting: Orthopedic Surgery

## 2017-11-25 ENCOUNTER — Encounter (INDEPENDENT_AMBULATORY_CARE_PROVIDER_SITE_OTHER): Payer: Self-pay | Admitting: Orthopedic Surgery

## 2017-11-25 VITALS — Ht 65.0 in | Wt 144.0 lb

## 2017-11-25 DIAGNOSIS — L97511 Non-pressure chronic ulcer of other part of right foot limited to breakdown of skin: Secondary | ICD-10-CM

## 2017-11-25 DIAGNOSIS — M25461 Effusion, right knee: Secondary | ICD-10-CM

## 2017-11-25 DIAGNOSIS — I7025 Atherosclerosis of native arteries of other extremities with ulceration: Secondary | ICD-10-CM

## 2017-11-25 MED ORDER — METHYLPREDNISOLONE ACETATE 40 MG/ML IJ SUSP
40.0000 mg | INTRAMUSCULAR | Status: AC | PRN
  Administered 2017-11-25: 40 mg via INTRA_ARTICULAR

## 2017-11-25 MED ORDER — LIDOCAINE HCL 1 % IJ SOLN
5.0000 mL | INTRAMUSCULAR | Status: AC | PRN
  Administered 2017-11-25: 5 mL

## 2017-11-25 NOTE — Progress Notes (Signed)
Office Visit Note   Patient: Destiny Day           Date of Birth: Nov 18, 1950           MRN: [id number] Visit Date: 11/25/2017              Requested by: Velna Hatchet, MD [address] [place], [place] [address] PCP: Velna Hatchet, MD  Chief Complaint  Patient presents with  . Right Knee - Pain  . Right Foot - Pain      HPI: Patient is a 67 year old woman who presents for evaluation right foot second toe ulceration and swelling she is status post right great toe amputation she also complains of chronic arthritis of her knee which she states she has had previous injections about 6 months ago which did relieve her symptoms.  Patient states she has had recurrent effusion and pain in her right knee.  Assessment & Plan: Visit Diagnoses:  1. Ulcer of toe of right foot, limited to breakdown of skin ([place])   2. 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  Right knee was aspirated and injected we will reevaluate both the foot and knee at follow-up.  Follow-Up Instructions: Return in about 1 month (around 12/23/2017).   Ortho Exam  Patient is alert, oriented, no adenopathy, well-dressed, normal affect, normal respiratory effort. Examination patient is using a walker for ambulation she has pain with weightbearing.  Examination the right foot she does have a palpable pulse she has swelling and a ulcer on the tip of the second toe which extends down to bone.  There is no cellulitis no odor no drainage.  Examination the right knee she has a tense effusion.  After informed consent the knee was aspirated 60 cc of clear synovial fluid and injected with steroid.  She tolerated this well.  Imaging: No  results found. No images are attached to the encounter.  Labs: Lab Results  Component Value Date   HGBA1C 5.0 09/03/2017   HGBA1C 7.0 (H) 08/11/2017   HGBA1C 7.5 (H) 06/24/2016   REPTSTATUS 08/25/2017 FINAL 08/24/2017   CULT NO GROWTH 08/24/2017   LABORGA STAPHYLOCOCCUS AUREUS 11/29/2008    Lab Results  Component Value Date/Time   HGBA1C 5.0 09/03/2017 02:37 PM   HGBA1C 7.0 (H) 08/11/2017 08:00 AM   HGBA1C 7.5 (H) 06/24/2016 04:13 AM    Body mass index is 23.96 kg/m.  Orders:  No orders of the defined types were placed in this encounter.  No orders of the defined types were placed in this encounter.    Procedures: Large Joint Inj: R knee on 11/25/2017 10:52 AM Indications: pain and diagnostic evaluation Details: 22 G 1.5 in needle, superolateral approach  Arthrogram: No  Medications: 5 mL lidocaine 1 %; 40 mg methylPREDNISolone acetate 40 MG/ML Aspirate: 60 mL clear Outcome: tolerated well, no immediate complications Procedure, treatment alternatives, risks and benefits explained, specific risks discussed. Consent was given by the patient. Immediately prior to procedure a time out was called to verify the correct patient, procedure, equipment, support staff and site/side marked as required. Patient was prepped and draped in the usual sterile fashion.      Clinical Data: No additional findings.  ROS:  All other systems negative, except as noted in the HPI. Review of Systems  Objective: Vital Signs: Ht 5\' 5"  (1.651 m)   Wt 144 lb (65.3 kg)   BMI 23.96 kg/m   Specialty Comments:  No specialty comments available.  PMFS History: Patient Active Problem List   Diagnosis Date Noted  . Ulcer of toe of right foot, limited to breakdown of skin ([place]) 11/09/2017  . Achilles tendon contracture, right 11/09/2017  . Labile blood glucose   . Anemia of infection and chronic disease   . Essential hypertension   . Black stool   . Right sided weakness   . Subdural  hemorrhage following injury ([place]) 08/30/2017  . Diabetic peripheral neuropathy ([place])   . Chronic obstructive pulmonary disease ([place])   . Seizure prophylaxis   . Subdural hematoma ([place]) 08/24/2017  . Traumatic subdural hematoma ([place]) 08/23/2017  . Fall   . SDH (subdural hematoma) ([place])   . Acute respiratory failure with hypoxia ([place])   . Diabetes mellitus type 2 in nonobese (HCC)   . Leukocytosis   . Labile blood pressure   . Generalized anxiety disorder   . Debility 08/16/2017  . Amputated great toe of right foot ([place])   . Amputated toe of left foot ([place])   . Post-operative pain   . ESRD on dialysis ([place])   . Diabetes mellitus ([place])   . Anxiety state   . Benign essential HTN   . Acute blood loss anemia   . Anemia of chronic disease   . Idiopathic chronic venous hypertension of both lower extremities with inflammation 10/13/2016  . S/P transmetatarsal amputation of foot, left ([place]) 09/21/2016  . Onychomycosis 08/15/2016  . Gangrene of toe of left foot ([place])   . PAD (peripheral artery disease) ([place]) 07/08/2016  . Atherosclerosis of native artery of left lower extremity with gangrene ([place]) 06/23/2016  . GERD (gastroesophageal reflux disease) 10/07/2015  . ARF (acute renal failure) ([place])   . Hypothermia 06/12/2015  . Acute on chronic renal failure ([place]) 06/12/2015  . CHF (congestive heart failure) ([place]) 07/30/2014  . CKD (chronic kidney disease) stage 4, GFR 15-29 ml/min (HCC) 06/27/2014  . Hypertensive heart disease 05/25/2013  . CKD (chronic kidney disease) stage 3, GFR 30-59 ml/min (HCC) 05/25/2013  . Pulmonary edema 05/23/2013  . Type 2 diabetes mellitus with diabetic nephropathy ([place]) 05/23/2013  . Type 2 diabetes mellitus with hyperosmolar nonketotic hyperglycemia ([place]) 04/13/2013  . Chronic diastolic CHF (congestive heart failure) ([place]) 04/13/2013  . UGI bleed 03/31/2013  . Hypokalemia 08/24/2012  . Type II or unspecified type diabetes mellitus without mention of  complication, not stated as uncontrolled 12/27/2007  . HLD (hyperlipidemia) 12/27/2007  . ALLERGIC RHINITIS 12/27/2007  . Asthma 12/27/2007   Past Medical History:  Diagnosis Date  . Abdominal bruit   . Anemia   . Anxiety   . Arthritis    Osteoarthritis  . Asthma   . Cervical disc disease    "pinced nerve"  . CHF (congestive heart failure) ([place])   . Complication of anesthesia    " after I got home from my last procedure, I started itching."  . Diabetes mellitus    Type II  . Diverticulitis   . ESRD on peritoneal dialysis [facility])    Hemodialysis - MWF- [place]   . GERD (gastroesophageal reflux disease)    from medications  . GI bleed 03/31/2013  . History of hiatal hernia   . Hyperlipidemia   . Hypertension   .  Neuropathy    left leg  . Osteoporosis   . Peripheral vascular disease ([place])   . Pneumonia    "very young"  . Seasonal allergies   . Shortness of breath dyspnea    WIth exertion  . Sleep apnea    can't afford cpap    Family History  Problem Relation Age of Onset  . Other Mother        not sure of cause of death  . Diabetes Father   . Pancreatic cancer Maternal Grandmother   . Colon cancer Neg Hx     Past Surgical History:  Procedure Laterality Date  . A/V SHUNTOGRAM N/A 09/22/2016   Procedure: A/V Shuntogram - left arm;  Surgeon: Serafina Mitchell, MD;  Location: [place] [facility] LAB;  Service: Cardiovascular;  Laterality: N/A;  . ABDOMINAL HYSTERECTOMY  1993`  . AMPUTATION Left 09/01/2016   Procedure: LEFT FOOT TRANSMETATARSAL AMPUTATION;  Surgeon: Newt Minion, MD;  Location: [place];  Service: Orthopedics;  Laterality: Left;  . AMPUTATION Right 08/11/2017   Procedure: RIGHT GREAT TOE AMPUTATION DIGIT;  Surgeon: Rosetta Posner, MD;  Location: [facility] OR;  Service: Vascular;  Laterality: Right;  . AV FISTULA PLACEMENT Left 04/21/2016   Procedure: INSERTION OF ARTERIOVENOUS (AV) GORE-TEX GRAFT ARM LEFT;  Surgeon: Elam Dutch, MD;  Location: [place];  Service: Vascular;   Laterality: Left;  . [place] TRANSPOSITION Left 07/10/2014   Procedure: BASCILIC VEIN TRANSPOSITION;  Surgeon: Angelia Mould, MD;  Location: [place];  Service: Vascular;  Laterality: Left;  . BASCILIC VEIN TRANSPOSITION Right 11/08/2014   Procedure: FIRST STAGE BASILIC VEIN TRANSPOSITION;  Surgeon: Angelia Mould, MD;  Location: [place];  Service: Vascular;  Laterality: Right;  . BASCILIC VEIN TRANSPOSITION Right 01/18/2015   Procedure: SECOND STAGE BASILIC VEIN TRANSPOSITION;  Surgeon: Angelia Mould, MD;  Location: [place];  Service: Vascular;  Laterality: Right;  . CRANIOTOMY N/A 08/23/2017   Procedure: CRANIOTOMY HEMATOMA EVACUATION SUBDURAL;  Surgeon: Ashok Pall, MD;  Location: [place];  Service: Neurosurgery;  Laterality: N/A;  . CRANIOTOMY Left 08/24/2017   Procedure: CRANIOTOMY FOR RECURRENT ACUTE SUBDURAL HEMATOMA;  Surgeon: Ashok Pall, MD;  Location: [place];  Service: Neurosurgery;  Laterality: Left;  . ESOPHAGOGASTRODUODENOSCOPY N/A 03/31/2013   Procedure: ESOPHAGOGASTRODUODENOSCOPY (EGD);  Surgeon: Gatha Mayer, MD;  Location: [facility] ENDOSCOPY;  Service: Endoscopy;  Laterality: N/A;  . EYE SURGERY     laser surgery  . FEMORAL-POPLITEAL BYPASS GRAFT Left 07/08/2016   Procedure: LEFT  FEMORAL-BELOW KNEE POPLITEAL ARTERY BYPASS GRAFT USING 6MM X 80 CM PROPATEN GORETEX GRAFT WITH RINGS.;  Surgeon: Rosetta Posner, MD;  Location: [place];  Service: Vascular;  Laterality: Left;  . FEMORAL-POPLITEAL BYPASS GRAFT Right 08/11/2017   Procedure: RIGHT FEMORAL TO BELOW KNEE POPLITEAL ARTERKY  BYPASS GRAFT USING 6MM RINGED PROPATEN GRAFT;  Surgeon: Rosetta Posner, MD;  Location: [place];  Service: Vascular;  Laterality: Right;  . FISTULOGRAM Left 10/29/2014   Procedure: FISTULOGRAM;  Surgeon: Angelia Mould, MD;  Location: [facility] CATH LAB;  Service: Cardiovascular;  Laterality: Left;  . LIGATION OF ARTERIOVENOUS  FISTULA Left 04/21/2016   Procedure: LIGATION OF ARTERIOVENOUS  FISTULA  LEFT ARM;  Surgeon: Elam Dutch, MD;  Location: [place];  Service: Vascular;  Laterality: Left;  . LOWER EXTREMITY ANGIOGRAPHY N/A 04/06/2017   Procedure: Lower Extremity Angiography - Right;  Surgeon: Serafina Mitchell, MD;  Location: [place] CV LAB;  Service: Cardiovascular;  Laterality:  N/A;  . PATCH ANGIOPLASTY Right 01/18/2015   Procedure: BASILIC VEIN PATCH ANGIOPLASTY USING VASCUGUARD PATCH;  Surgeon: Angelia Mould, MD;  Location: [place];  Service: Vascular;  Laterality: Right;  . PERIPHERAL VASCULAR BALLOON ANGIOPLASTY  09/22/2016   Procedure: Peripheral Vascular Balloon Angioplasty;  Surgeon: Serafina Mitchell, MD;  Location: [place] CV LAB;  Service: Cardiovascular;;  Lt. Fistula  . PERIPHERAL VASCULAR CATHETERIZATION N/A 06/23/2016   Procedure: Abdominal Aortogram w/Lower Extremity;  Surgeon: Serafina Mitchell, MD;  Location: [place] CV LAB;  Service: Cardiovascular;  Laterality: N/A;  . PERIPHERAL VASCULAR CATHETERIZATION  06/23/2016   Procedure: Peripheral Vascular Intervention;  Surgeon: Serafina Mitchell, MD;  Location: [place] CV LAB;  Service: Cardiovascular;;  lt common and external illiac artery   Social History   Occupational History  . Occupation: Retired  Tobacco Use  . Smoking status: Former Smoker    Packs/day: 0.35    Years: 40.00    Pack years: 14.00    Types: Cigarettes    Last attempt to quit: 05/10/2012    Years since quitting: 5.5  . Smokeless tobacco: Never Used  Substance and Sexual Activity  . Alcohol use: No  . Drug use: No    Comment: marijuana; quit in early 1980's  . Sexual activity: Yes

## 2017-11-26 DIAGNOSIS — N2581 Secondary hyperparathyroidism of renal origin: Secondary | ICD-10-CM | POA: Diagnosis not present

## 2017-11-26 DIAGNOSIS — N186 End stage renal disease: Secondary | ICD-10-CM | POA: Diagnosis not present

## 2017-11-26 DIAGNOSIS — E876 Hypokalemia: Secondary | ICD-10-CM | POA: Diagnosis not present

## 2017-11-26 DIAGNOSIS — D509 Iron deficiency anemia, unspecified: Secondary | ICD-10-CM | POA: Diagnosis not present

## 2017-11-26 DIAGNOSIS — D631 Anemia in chronic kidney disease: Secondary | ICD-10-CM | POA: Diagnosis not present

## 2017-11-26 DIAGNOSIS — E1122 Type 2 diabetes mellitus with diabetic chronic kidney disease: Secondary | ICD-10-CM | POA: Diagnosis not present

## 2017-11-29 DIAGNOSIS — N2581 Secondary hyperparathyroidism of renal origin: Secondary | ICD-10-CM | POA: Diagnosis not present

## 2017-11-29 DIAGNOSIS — N186 End stage renal disease: Secondary | ICD-10-CM | POA: Diagnosis not present

## 2017-11-29 DIAGNOSIS — D509 Iron deficiency anemia, unspecified: Secondary | ICD-10-CM | POA: Diagnosis not present

## 2017-11-29 DIAGNOSIS — D631 Anemia in chronic kidney disease: Secondary | ICD-10-CM | POA: Diagnosis not present

## 2017-11-29 DIAGNOSIS — E876 Hypokalemia: Secondary | ICD-10-CM | POA: Diagnosis not present

## 2017-11-29 DIAGNOSIS — E1122 Type 2 diabetes mellitus with diabetic chronic kidney disease: Secondary | ICD-10-CM | POA: Diagnosis not present

## 2017-11-30 ENCOUNTER — Ambulatory Visit (INDEPENDENT_AMBULATORY_CARE_PROVIDER_SITE_OTHER): Payer: Medicare Other | Admitting: Orthopedic Surgery

## 2017-12-01 DIAGNOSIS — D509 Iron deficiency anemia, unspecified: Secondary | ICD-10-CM | POA: Diagnosis not present

## 2017-12-01 DIAGNOSIS — E876 Hypokalemia: Secondary | ICD-10-CM | POA: Diagnosis not present

## 2017-12-01 DIAGNOSIS — N186 End stage renal disease: Secondary | ICD-10-CM | POA: Diagnosis not present

## 2017-12-01 DIAGNOSIS — N2581 Secondary hyperparathyroidism of renal origin: Secondary | ICD-10-CM | POA: Diagnosis not present

## 2017-12-01 DIAGNOSIS — E1122 Type 2 diabetes mellitus with diabetic chronic kidney disease: Secondary | ICD-10-CM | POA: Diagnosis not present

## 2017-12-01 DIAGNOSIS — D631 Anemia in chronic kidney disease: Secondary | ICD-10-CM | POA: Diagnosis not present

## 2017-12-03 DIAGNOSIS — D509 Iron deficiency anemia, unspecified: Secondary | ICD-10-CM | POA: Diagnosis not present

## 2017-12-03 DIAGNOSIS — D631 Anemia in chronic kidney disease: Secondary | ICD-10-CM | POA: Diagnosis not present

## 2017-12-03 DIAGNOSIS — E1122 Type 2 diabetes mellitus with diabetic chronic kidney disease: Secondary | ICD-10-CM | POA: Diagnosis not present

## 2017-12-03 DIAGNOSIS — N2581 Secondary hyperparathyroidism of renal origin: Secondary | ICD-10-CM | POA: Diagnosis not present

## 2017-12-03 DIAGNOSIS — N186 End stage renal disease: Secondary | ICD-10-CM | POA: Diagnosis not present

## 2017-12-03 DIAGNOSIS — E876 Hypokalemia: Secondary | ICD-10-CM | POA: Diagnosis not present

## 2017-12-06 DIAGNOSIS — E876 Hypokalemia: Secondary | ICD-10-CM | POA: Diagnosis not present

## 2017-12-06 DIAGNOSIS — N2581 Secondary hyperparathyroidism of renal origin: Secondary | ICD-10-CM | POA: Diagnosis not present

## 2017-12-06 DIAGNOSIS — N186 End stage renal disease: Secondary | ICD-10-CM | POA: Diagnosis not present

## 2017-12-06 DIAGNOSIS — E1122 Type 2 diabetes mellitus with diabetic chronic kidney disease: Secondary | ICD-10-CM | POA: Diagnosis not present

## 2017-12-06 DIAGNOSIS — D509 Iron deficiency anemia, unspecified: Secondary | ICD-10-CM | POA: Diagnosis not present

## 2017-12-06 DIAGNOSIS — D631 Anemia in chronic kidney disease: Secondary | ICD-10-CM | POA: Diagnosis not present

## 2017-12-08 DIAGNOSIS — E1122 Type 2 diabetes mellitus with diabetic chronic kidney disease: Secondary | ICD-10-CM | POA: Diagnosis not present

## 2017-12-08 DIAGNOSIS — D509 Iron deficiency anemia, unspecified: Secondary | ICD-10-CM | POA: Diagnosis not present

## 2017-12-08 DIAGNOSIS — N2581 Secondary hyperparathyroidism of renal origin: Secondary | ICD-10-CM | POA: Diagnosis not present

## 2017-12-08 DIAGNOSIS — E876 Hypokalemia: Secondary | ICD-10-CM | POA: Diagnosis not present

## 2017-12-08 DIAGNOSIS — D631 Anemia in chronic kidney disease: Secondary | ICD-10-CM | POA: Diagnosis not present

## 2017-12-08 DIAGNOSIS — N186 End stage renal disease: Secondary | ICD-10-CM | POA: Diagnosis not present

## 2017-12-10 DIAGNOSIS — E876 Hypokalemia: Secondary | ICD-10-CM | POA: Diagnosis not present

## 2017-12-10 DIAGNOSIS — E1122 Type 2 diabetes mellitus with diabetic chronic kidney disease: Secondary | ICD-10-CM | POA: Diagnosis not present

## 2017-12-10 DIAGNOSIS — N2581 Secondary hyperparathyroidism of renal origin: Secondary | ICD-10-CM | POA: Diagnosis not present

## 2017-12-10 DIAGNOSIS — D509 Iron deficiency anemia, unspecified: Secondary | ICD-10-CM | POA: Diagnosis not present

## 2017-12-10 DIAGNOSIS — D631 Anemia in chronic kidney disease: Secondary | ICD-10-CM | POA: Diagnosis not present

## 2017-12-10 DIAGNOSIS — N186 End stage renal disease: Secondary | ICD-10-CM | POA: Diagnosis not present

## 2017-12-13 DIAGNOSIS — N2581 Secondary hyperparathyroidism of renal origin: Secondary | ICD-10-CM | POA: Diagnosis not present

## 2017-12-13 DIAGNOSIS — N186 End stage renal disease: Secondary | ICD-10-CM | POA: Diagnosis not present

## 2017-12-13 DIAGNOSIS — E1122 Type 2 diabetes mellitus with diabetic chronic kidney disease: Secondary | ICD-10-CM | POA: Diagnosis not present

## 2017-12-13 DIAGNOSIS — D631 Anemia in chronic kidney disease: Secondary | ICD-10-CM | POA: Diagnosis not present

## 2017-12-13 DIAGNOSIS — E876 Hypokalemia: Secondary | ICD-10-CM | POA: Diagnosis not present

## 2017-12-13 DIAGNOSIS — D509 Iron deficiency anemia, unspecified: Secondary | ICD-10-CM | POA: Diagnosis not present

## 2017-12-15 DIAGNOSIS — N186 End stage renal disease: Secondary | ICD-10-CM | POA: Diagnosis not present

## 2017-12-15 DIAGNOSIS — N2581 Secondary hyperparathyroidism of renal origin: Secondary | ICD-10-CM | POA: Diagnosis not present

## 2017-12-15 DIAGNOSIS — D509 Iron deficiency anemia, unspecified: Secondary | ICD-10-CM | POA: Diagnosis not present

## 2017-12-15 DIAGNOSIS — E876 Hypokalemia: Secondary | ICD-10-CM | POA: Diagnosis not present

## 2017-12-15 DIAGNOSIS — E1122 Type 2 diabetes mellitus with diabetic chronic kidney disease: Secondary | ICD-10-CM | POA: Diagnosis not present

## 2017-12-15 DIAGNOSIS — D631 Anemia in chronic kidney disease: Secondary | ICD-10-CM | POA: Diagnosis not present

## 2017-12-17 DIAGNOSIS — D631 Anemia in chronic kidney disease: Secondary | ICD-10-CM | POA: Diagnosis not present

## 2017-12-17 DIAGNOSIS — E1122 Type 2 diabetes mellitus with diabetic chronic kidney disease: Secondary | ICD-10-CM | POA: Diagnosis not present

## 2017-12-17 DIAGNOSIS — N186 End stage renal disease: Secondary | ICD-10-CM | POA: Diagnosis not present

## 2017-12-17 DIAGNOSIS — D509 Iron deficiency anemia, unspecified: Secondary | ICD-10-CM | POA: Diagnosis not present

## 2017-12-17 DIAGNOSIS — N2581 Secondary hyperparathyroidism of renal origin: Secondary | ICD-10-CM | POA: Diagnosis not present

## 2017-12-17 DIAGNOSIS — E876 Hypokalemia: Secondary | ICD-10-CM | POA: Diagnosis not present

## 2017-12-20 DIAGNOSIS — D631 Anemia in chronic kidney disease: Secondary | ICD-10-CM | POA: Diagnosis not present

## 2017-12-20 DIAGNOSIS — D509 Iron deficiency anemia, unspecified: Secondary | ICD-10-CM | POA: Diagnosis not present

## 2017-12-20 DIAGNOSIS — N186 End stage renal disease: Secondary | ICD-10-CM | POA: Diagnosis not present

## 2017-12-20 DIAGNOSIS — N2581 Secondary hyperparathyroidism of renal origin: Secondary | ICD-10-CM | POA: Diagnosis not present

## 2017-12-20 DIAGNOSIS — E1122 Type 2 diabetes mellitus with diabetic chronic kidney disease: Secondary | ICD-10-CM | POA: Diagnosis not present

## 2017-12-20 DIAGNOSIS — E876 Hypokalemia: Secondary | ICD-10-CM | POA: Diagnosis not present

## 2017-12-22 DIAGNOSIS — D631 Anemia in chronic kidney disease: Secondary | ICD-10-CM | POA: Diagnosis not present

## 2017-12-22 DIAGNOSIS — N186 End stage renal disease: Secondary | ICD-10-CM | POA: Diagnosis not present

## 2017-12-22 DIAGNOSIS — E1122 Type 2 diabetes mellitus with diabetic chronic kidney disease: Secondary | ICD-10-CM | POA: Diagnosis not present

## 2017-12-22 DIAGNOSIS — E876 Hypokalemia: Secondary | ICD-10-CM | POA: Diagnosis not present

## 2017-12-22 DIAGNOSIS — N2581 Secondary hyperparathyroidism of renal origin: Secondary | ICD-10-CM | POA: Diagnosis not present

## 2017-12-22 DIAGNOSIS — D509 Iron deficiency anemia, unspecified: Secondary | ICD-10-CM | POA: Diagnosis not present

## 2017-12-23 ENCOUNTER — Encounter (INDEPENDENT_AMBULATORY_CARE_PROVIDER_SITE_OTHER): Payer: Self-pay | Admitting: Orthopedic Surgery

## 2017-12-23 ENCOUNTER — Ambulatory Visit (INDEPENDENT_AMBULATORY_CARE_PROVIDER_SITE_OTHER): Payer: Medicare Other | Admitting: Orthopedic Surgery

## 2017-12-23 DIAGNOSIS — I70261 Atherosclerosis of native arteries of extremities with gangrene, right leg: Secondary | ICD-10-CM | POA: Diagnosis not present

## 2017-12-23 DIAGNOSIS — Z89432 Acquired absence of left foot: Secondary | ICD-10-CM

## 2017-12-23 DIAGNOSIS — I96 Gangrene, not elsewhere classified: Secondary | ICD-10-CM | POA: Diagnosis not present

## 2017-12-23 MED ORDER — DOXYCYCLINE HYCLATE 100 MG PO TABS: 100.0000 mg | ORAL_TABLET | Freq: Two times a day (BID) | ORAL | 0 refills | Status: DC

## 2017-12-23 NOTE — Progress Notes (Signed)
Office Visit Note   Patient: Destiny Day           Date of Birth: [birth year]           MRN: [id number] Visit Date: 12/23/2017              Requested by: Velna Hatchet, MD [address] [place], [place] [address] PCP: Velna Hatchet, MD  Chief Complaint  Patient presents with  . Right Foot - Pain  . 1st toe      HPI: Patient is a 67 year old one woman with diabetic insensate neuropathy peripheral vascular disease end-stage renal disease on dialysis Monday Wednesday Friday who presents with a necrotic ulcerated infection of the right second toe she denies any systemic symptoms.  Patient complains of venous stasis swelling in her leg as well.  Patient states that she was unable to wear her medical compression stockings.  Assessment & Plan: Visit Diagnoses: No diagnosis found.  Plan: Patient would like to proceed with surgery towards the end of the weeks or her husband can be home to take care of her.  We will plan for surgery next Friday she will change her dialysis to Thursday and she wishes to be discharged after surgery and then she will resume her dialysis.  A prescription for doxycycline was called in.  Discussed with the patient we could proceed with a second toe amputation.  Patient states she is not interested in further toe amputations and wants to proceed with a transmetatarsal amputation.  Follow-Up Instructions: Return in about 2 weeks (around 01/06/2018).   Ortho Exam  Patient is alert, oriented, no adenopathy, well-dressed, normal affect, normal respiratory effort. Examination patient has a palpable dorsalis pedis and posterior tibial pulse.  She has venous stasis changes in her right leg with brawny skin color and swelling but no open ulcers.  She has necrotic wound over the right second toe.  After informed consent the ulcer was debrided of skin and soft tissue back to bleeding viable granulation tissue this was touched with silver nitrate the ulcer is 2 cm in  diameter she has sausage digit swelling of the toe with exposed bone there is no ascending cellulitis.  Imaging: No results found. No images are attached to the encounter.  Labs: Lab Results  Component Value Date   HGBA1C 5.0 09/03/2017   HGBA1C 7.0 (H) 08/11/2017   HGBA1C 7.5 (H) 06/24/2016   REPTSTATUS 08/25/2017 FINAL 08/24/2017   CULT NO GROWTH 08/24/2017   LABORGA STAPHYLOCOCCUS AUREUS 11/29/2008     Lab Results  Component Value Date   ALBUMIN 2.5 (L) 09/13/2017   ALBUMIN 2.5 (L) 09/10/2017   ALBUMIN 2.5 (L) 09/08/2017    There is no height or weight on file to calculate BMI.  Orders:  No orders of the defined types were placed in this encounter.  Meds ordered this encounter  Medications  . doxycycline (VIBRA-TABS) 100 MG tablet    Sig: Take 1 tablet (100 mg total) by mouth 2 (two) times daily.    Dispense:  60 tablet    Refill:  0     Procedures: No procedures performed  Clinical Data: No additional findings.  ROS:  All other systems negative, except as noted in the HPI. Review of Systems  Objective: Vital Signs: There were no vitals taken for this visit.  Specialty Comments:  No specialty comments available.  PMFS History: Patient Active Problem List   Diagnosis Date Noted  . Ulcer of toe of right foot, limited  to breakdown of skin ([place]) 11/09/2017  . Achilles tendon contracture, right 11/09/2017  . Labile blood glucose   . Anemia of infection and chronic disease   . Essential hypertension   . Black stool   . Right sided weakness   . Subdural hemorrhage following injury ([place]) 08/30/2017  . Diabetic peripheral neuropathy ([place])   . Chronic obstructive pulmonary disease ([place])   . Seizure prophylaxis   . Subdural hematoma ([place]) 08/24/2017  . Traumatic subdural hematoma ([place]) 08/23/2017  . Fall   . SDH (subdural hematoma) ([place])   . Acute respiratory failure with hypoxia ([place])   . Diabetes mellitus type 2 in nonobese (HCC)   . Leukocytosis     . Labile blood pressure   . Generalized anxiety disorder   . Debility 08/16/2017  . Amputated great toe of right foot ([place])   . Amputated toe of left foot ([place])   . Post-operative pain   . ESRD on dialysis ([place])   . Diabetes mellitus ([place])   . Anxiety state   . Benign essential HTN   . Acute blood loss anemia   . Anemia of chronic disease   . Idiopathic chronic venous hypertension of both lower extremities with inflammation 10/13/2016  . S/P transmetatarsal amputation of foot, left ([place]) 09/21/2016  . Onychomycosis 08/15/2016  . Gangrene of toe of left foot ([place])   . PAD (peripheral artery disease) ([place]) 07/08/2016  . Atherosclerosis of native artery of left lower extremity with gangrene ([place]) 06/23/2016  . GERD (gastroesophageal reflux disease) 10/07/2015  . ARF (acute renal failure) ([place])   . Hypothermia 06/12/2015  . Acute on chronic renal failure ([place]) 06/12/2015  . CHF (congestive heart failure) ([place]) 07/30/2014  . CKD (chronic kidney disease) stage 4, GFR 15-29 ml/min (HCC) 06/27/2014  . Hypertensive heart disease 05/25/2013  . CKD (chronic kidney disease) stage 3, GFR 30-59 ml/min (HCC) 05/25/2013  . Pulmonary edema 05/23/2013  . Type 2 diabetes mellitus with diabetic nephropathy ([place]) 05/23/2013  . Type 2 diabetes mellitus with hyperosmolar nonketotic hyperglycemia ([place]) 04/13/2013  . Chronic diastolic CHF (congestive heart failure) ([place]) 04/13/2013  . UGI bleed 03/31/2013  . Hypokalemia 08/24/2012  . Type II or unspecified type diabetes mellitus without mention of complication, not stated as uncontrolled 12/27/2007  . HLD (hyperlipidemia) 12/27/2007  . ALLERGIC RHINITIS 12/27/2007  . Asthma 12/27/2007   Past Medical History:  Diagnosis Date  . Abdominal bruit   . Anemia   . Anxiety   . Arthritis    Osteoarthritis  . Asthma   . Cervical disc disease    "pinced nerve"  . CHF (congestive heart failure) ([place])   . Complication of anesthesia    " after I got home  from my last procedure, I started itching."  . Diabetes mellitus    Type II  . Diverticulitis   . ESRD on peritoneal dialysis [facility])    Hemodialysis - MWF- [place]   . GERD (gastroesophageal reflux disease)    from medications  . GI bleed 03/31/2013  . History of hiatal hernia   . Hyperlipidemia   . Hypertension   . Neuropathy    left leg  . Osteoporosis   . Peripheral vascular disease ([place])   . Pneumonia    "very young"  . Seasonal allergies   . Shortness of breath dyspnea    WIth exertion  . Sleep apnea    can't afford cpap    Family History  Problem Relation Age of Onset  .  Other Mother        not sure of cause of death  . Diabetes Father   . Pancreatic cancer Maternal Grandmother   . Colon cancer Neg Hx     Past Surgical History:  Procedure Laterality Date  . A/V SHUNTOGRAM N/A 09/22/2016   Procedure: A/V Shuntogram - left arm;  Surgeon: Serafina Mitchell, MD;  Location: [place] [facility];  Service: Cardiovascular;  Laterality: N/A;  . ABDOMINAL HYSTERECTOMY  1993`  . AMPUTATION Left 09/01/2016   Procedure: LEFT FOOT TRANSMETATARSAL AMPUTATION;  Surgeon: Newt Minion, MD;  Location: [place];  Service: Orthopedics;  Laterality: Left;  . AMPUTATION Right 08/11/2017   Procedure: RIGHT GREAT TOE AMPUTATION DIGIT;  Surgeon: Rosetta Posner, MD;  [facility];  Service: Vascular;  Laterality: Right;  . AV FISTULA PLACEMENT Left 04/21/2016   Procedure: INSERTION OF ARTERIOVENOUS (AV) GORE-TEX GRAFT ARM LEFT;  Surgeon: Elam Dutch, MD;  Location: [place];  Service: Vascular;  Laterality: Left;  . [place] TRANSPOSITION Left 07/10/2014   Procedure: BASCILIC VEIN TRANSPOSITION;  Surgeon: Angelia Mould, MD;  Location: [place];  Service: Vascular;  Laterality: Left;  . BASCILIC VEIN TRANSPOSITION Right 11/08/2014   Procedure: FIRST STAGE BASILIC VEIN TRANSPOSITION;  Surgeon: Angelia Mould, MD;  Location: [place];  Service: Vascular;  Laterality: Right;  . BASCILIC  VEIN TRANSPOSITION Right 01/18/2015   Procedure: SECOND STAGE BASILIC VEIN TRANSPOSITION;  Surgeon: Angelia Mould, MD;  Location: [place];  Service: Vascular;  Laterality: Right;  . CRANIOTOMY N/A 08/23/2017   Procedure: CRANIOTOMY HEMATOMA EVACUATION SUBDURAL;  Surgeon: Ashok Pall, MD;  Location: [place];  Service: Neurosurgery;  Laterality: N/A;  . CRANIOTOMY Left 08/24/2017   Procedure: CRANIOTOMY FOR RECURRENT ACUTE SUBDURAL HEMATOMA;  Surgeon: Ashok Pall, MD;  Location: [place];  Service: Neurosurgery;  Laterality: Left;  . ESOPHAGOGASTRODUODENOSCOPY N/A 03/31/2013   Procedure: ESOPHAGOGASTRODUODENOSCOPY (EGD);  Surgeon: Gatha Mayer, MD;  Location: [facility] ENDOSCOPY;  Service: Endoscopy;  Laterality: N/A;  . EYE SURGERY     laser surgery  . FEMORAL-POPLITEAL BYPASS GRAFT Left 07/08/2016   Procedure: LEFT  FEMORAL-BELOW KNEE POPLITEAL ARTERY BYPASS GRAFT USING 6MM X 80 CM PROPATEN GORETEX GRAFT WITH RINGS.;  Surgeon: Rosetta Posner, MD;  Location: [place];  Service: Vascular;  Laterality: Left;  . FEMORAL-POPLITEAL BYPASS GRAFT Right 08/11/2017   Procedure: RIGHT FEMORAL TO BELOW KNEE POPLITEAL ARTERKY  BYPASS GRAFT USING 6MM RINGED PROPATEN GRAFT;  Surgeon: Rosetta Posner, MD;  Location: [place];  Service: Vascular;  Laterality: Right;  . FISTULOGRAM Left 10/29/2014   Procedure: FISTULOGRAM;  Surgeon: Angelia Mould, MD;  Location: [facility] CATH LAB;  Service: Cardiovascular;  Laterality: Left;  . LIGATION OF ARTERIOVENOUS  FISTULA Left 04/21/2016   Procedure: LIGATION OF ARTERIOVENOUS  FISTULA LEFT ARM;  Surgeon: Elam Dutch, MD;  Location: [place];  Service: Vascular;  Laterality: Left;  . LOWER EXTREMITY ANGIOGRAPHY N/A 04/06/2017   Procedure: Lower Extremity Angiography - Right;  Surgeon: Serafina Mitchell, MD;  Location: [place] [facility];  Service: Cardiovascular;  Laterality: N/A;  . PATCH ANGIOPLASTY Right 01/18/2015   Procedure: BASILIC VEIN PATCH ANGIOPLASTY USING VASCUGUARD PATCH;   Surgeon: Angelia Mould, MD;  Location: [place];  Service: Vascular;  Laterality: Right;  . PERIPHERAL VASCULAR BALLOON ANGIOPLASTY  09/22/2016   Procedure: Peripheral Vascular Balloon Angioplasty;  Surgeon: Serafina Mitchell, MD;  Location: [place] [facility];  Service: Cardiovascular;;  Lt. Fistula  . PERIPHERAL VASCULAR CATHETERIZATION N/A 06/23/2016   Procedure: Abdominal Aortogram w/Lower Extremity;  Surgeon: Serafina Mitchell, MD;  Location: [place] [facility];  Service: Cardiovascular;  Laterality: N/A;  . PERIPHERAL VASCULAR CATHETERIZATION  06/23/2016   Procedure: Peripheral Vascular Intervention;  Surgeon: Serafina Mitchell, MD;  Location: [place] [facility] LAB;  Service: Cardiovascular;;  lt common and external illiac artery   Social History   Occupational History  . Occupation: Retired  Tobacco Use  . Smoking status: Former Smoker    Packs/day: 0.35    Years: 40.00    Pack years: 14.00    Types: Cigarettes    Last attempt to quit: 05/10/2012    Years since quitting: 5.6  . Smokeless tobacco: Never Used  Substance and Sexual Activity  . Alcohol use: No  . Drug use: No    Comment: marijuana; quit in early 1980's  . Sexual activity: Yes

## 2017-12-24 DIAGNOSIS — D509 Iron deficiency anemia, unspecified: Secondary | ICD-10-CM | POA: Diagnosis not present

## 2017-12-24 DIAGNOSIS — N186 End stage renal disease: Secondary | ICD-10-CM | POA: Diagnosis not present

## 2017-12-24 DIAGNOSIS — E876 Hypokalemia: Secondary | ICD-10-CM | POA: Diagnosis not present

## 2017-12-24 DIAGNOSIS — D631 Anemia in chronic kidney disease: Secondary | ICD-10-CM | POA: Diagnosis not present

## 2017-12-24 DIAGNOSIS — E1122 Type 2 diabetes mellitus with diabetic chronic kidney disease: Secondary | ICD-10-CM | POA: Diagnosis not present

## 2017-12-24 DIAGNOSIS — N2581 Secondary hyperparathyroidism of renal origin: Secondary | ICD-10-CM | POA: Diagnosis not present

## 2017-12-25 DIAGNOSIS — Z992 Dependence on renal dialysis: Secondary | ICD-10-CM | POA: Diagnosis not present

## 2017-12-25 DIAGNOSIS — N186 End stage renal disease: Secondary | ICD-10-CM | POA: Diagnosis not present

## 2017-12-25 DIAGNOSIS — I159 Secondary hypertension, unspecified: Secondary | ICD-10-CM | POA: Diagnosis not present

## 2017-12-27 DIAGNOSIS — E1122 Type 2 diabetes mellitus with diabetic chronic kidney disease: Secondary | ICD-10-CM | POA: Diagnosis not present

## 2017-12-27 DIAGNOSIS — N186 End stage renal disease: Secondary | ICD-10-CM | POA: Diagnosis not present

## 2017-12-27 DIAGNOSIS — D631 Anemia in chronic kidney disease: Secondary | ICD-10-CM | POA: Diagnosis not present

## 2017-12-27 DIAGNOSIS — D509 Iron deficiency anemia, unspecified: Secondary | ICD-10-CM | POA: Diagnosis not present

## 2017-12-27 DIAGNOSIS — E876 Hypokalemia: Secondary | ICD-10-CM | POA: Diagnosis not present

## 2017-12-27 DIAGNOSIS — N2581 Secondary hyperparathyroidism of renal origin: Secondary | ICD-10-CM | POA: Diagnosis not present

## 2017-12-29 ENCOUNTER — Encounter (HOSPITAL_COMMUNITY): Payer: Self-pay | Admitting: *Deleted

## 2017-12-29 DIAGNOSIS — D509 Iron deficiency anemia, unspecified: Secondary | ICD-10-CM | POA: Diagnosis not present

## 2017-12-29 DIAGNOSIS — N186 End stage renal disease: Secondary | ICD-10-CM | POA: Diagnosis not present

## 2017-12-29 DIAGNOSIS — D631 Anemia in chronic kidney disease: Secondary | ICD-10-CM | POA: Diagnosis not present

## 2017-12-29 DIAGNOSIS — E876 Hypokalemia: Secondary | ICD-10-CM | POA: Diagnosis not present

## 2017-12-29 DIAGNOSIS — N2581 Secondary hyperparathyroidism of renal origin: Secondary | ICD-10-CM | POA: Diagnosis not present

## 2017-12-29 DIAGNOSIS — E1122 Type 2 diabetes mellitus with diabetic chronic kidney disease: Secondary | ICD-10-CM | POA: Diagnosis not present

## 2017-12-30 ENCOUNTER — Encounter (HOSPITAL_COMMUNITY): Payer: Self-pay | Admitting: *Deleted

## 2017-12-30 ENCOUNTER — Other Ambulatory Visit (INDEPENDENT_AMBULATORY_CARE_PROVIDER_SITE_OTHER): Payer: Self-pay | Admitting: Orthopedic Surgery

## 2017-12-30 ENCOUNTER — Other Ambulatory Visit: Payer: Self-pay

## 2017-12-30 DIAGNOSIS — E1122 Type 2 diabetes mellitus with diabetic chronic kidney disease: Secondary | ICD-10-CM | POA: Diagnosis not present

## 2017-12-30 DIAGNOSIS — N186 End stage renal disease: Secondary | ICD-10-CM | POA: Diagnosis not present

## 2017-12-30 DIAGNOSIS — D631 Anemia in chronic kidney disease: Secondary | ICD-10-CM | POA: Diagnosis not present

## 2017-12-30 DIAGNOSIS — N2581 Secondary hyperparathyroidism of renal origin: Secondary | ICD-10-CM | POA: Diagnosis not present

## 2017-12-30 DIAGNOSIS — E876 Hypokalemia: Secondary | ICD-10-CM | POA: Diagnosis not present

## 2017-12-30 DIAGNOSIS — D509 Iron deficiency anemia, unspecified: Secondary | ICD-10-CM | POA: Diagnosis not present

## 2017-12-30 DIAGNOSIS — M86271 Subacute osteomyelitis, right ankle and foot: Secondary | ICD-10-CM

## 2017-12-30 NOTE — Progress Notes (Signed)
Spoke with pt for pre-op call. Pt denies cardiac history, chest pain or sob. Pt is a dialysis patient. Pt is a type 2 diabetic. Last A1C was 5.0 on 2//8/19. Pt states her fasting blood sugar is usually around 88. Pt instructed to take 1/2 of her regular dose of Humulin N Insulin this evening (to take 5 units). Instructed pt to check her blood sugar in the AM when she gets up and every 2 hours until she leaves for the hospital. If blood sugar is >220 take 1/2 of usual correction dose of Humulin R insulin. If blood sugar is 70 or below, treat with 1/2 cup of clear juice (apple or cranberry) and recheck blood sugar 15 minutes after drinking juice. If blood sugar continues to be 70 or below, call the Short Stay department and ask to speak to a nurse. Pt voiced understanding.

## 2017-12-31 ENCOUNTER — Encounter (HOSPITAL_COMMUNITY)
Admission: RE | Disposition: A | Discharge status: Home / Self Care | Payer: Self-pay | Source: Ambulatory Visit | Attending: Orthopedic Surgery

## 2017-12-31 ENCOUNTER — Ambulatory Visit (HOSPITAL_COMMUNITY): Payer: Medicare Other | Admitting: Anesthesiology

## 2017-12-31 ENCOUNTER — Other Ambulatory Visit (INDEPENDENT_AMBULATORY_CARE_PROVIDER_SITE_OTHER): Payer: Self-pay

## 2017-12-31 ENCOUNTER — Encounter (HOSPITAL_COMMUNITY): Payer: Self-pay | Admitting: *Deleted

## 2017-12-31 ENCOUNTER — Other Ambulatory Visit: Payer: Self-pay

## 2017-12-31 ENCOUNTER — Ambulatory Visit (HOSPITAL_COMMUNITY)
Admission: RE | Admit: 2017-12-31 | Discharge: 2017-12-31 | Disposition: A | Discharge status: Home / Self Care | Payer: Medicare Other | Source: Ambulatory Visit | Attending: Orthopedic Surgery | Admitting: Orthopedic Surgery

## 2017-12-31 ENCOUNTER — Telehealth (INDEPENDENT_AMBULATORY_CARE_PROVIDER_SITE_OTHER): Payer: Self-pay

## 2017-12-31 DIAGNOSIS — G4730 Sleep apnea, unspecified: Secondary | ICD-10-CM | POA: Insufficient documentation

## 2017-12-31 DIAGNOSIS — E1169 Type 2 diabetes mellitus with other specified complication: Secondary | ICD-10-CM | POA: Diagnosis not present

## 2017-12-31 DIAGNOSIS — Z89411 Acquired absence of right great toe: Secondary | ICD-10-CM | POA: Insufficient documentation

## 2017-12-31 DIAGNOSIS — Z79899 Other long term (current) drug therapy: Secondary | ICD-10-CM | POA: Diagnosis not present

## 2017-12-31 DIAGNOSIS — E1152 Type 2 diabetes mellitus with diabetic peripheral angiopathy with gangrene: Secondary | ICD-10-CM | POA: Insufficient documentation

## 2017-12-31 DIAGNOSIS — I132 Hypertensive heart and chronic kidney disease with heart failure and with stage 5 chronic kidney disease, or end stage renal disease: Secondary | ICD-10-CM | POA: Diagnosis not present

## 2017-12-31 DIAGNOSIS — I96 Gangrene, not elsewhere classified: Secondary | ICD-10-CM | POA: Diagnosis not present

## 2017-12-31 DIAGNOSIS — E785 Hyperlipidemia, unspecified: Secondary | ICD-10-CM | POA: Diagnosis not present

## 2017-12-31 DIAGNOSIS — Z7982 Long term (current) use of aspirin: Secondary | ICD-10-CM | POA: Diagnosis not present

## 2017-12-31 DIAGNOSIS — Z87891 Personal history of nicotine dependence: Secondary | ICD-10-CM | POA: Insufficient documentation

## 2017-12-31 DIAGNOSIS — M86271 Subacute osteomyelitis, right ankle and foot: Secondary | ICD-10-CM

## 2017-12-31 DIAGNOSIS — Z89422 Acquired absence of other left toe(s): Secondary | ICD-10-CM | POA: Diagnosis not present

## 2017-12-31 DIAGNOSIS — Z992 Dependence on renal dialysis: Secondary | ICD-10-CM | POA: Insufficient documentation

## 2017-12-31 DIAGNOSIS — Z7902 Long term (current) use of antithrombotics/antiplatelets: Secondary | ICD-10-CM | POA: Insufficient documentation

## 2017-12-31 DIAGNOSIS — K219 Gastro-esophageal reflux disease without esophagitis: Secondary | ICD-10-CM | POA: Diagnosis not present

## 2017-12-31 DIAGNOSIS — E1122 Type 2 diabetes mellitus with diabetic chronic kidney disease: Secondary | ICD-10-CM | POA: Diagnosis not present

## 2017-12-31 DIAGNOSIS — Z794 Long term (current) use of insulin: Secondary | ICD-10-CM | POA: Diagnosis not present

## 2017-12-31 DIAGNOSIS — N186 End stage renal disease: Secondary | ICD-10-CM | POA: Diagnosis not present

## 2017-12-31 DIAGNOSIS — M869 Osteomyelitis, unspecified: Secondary | ICD-10-CM | POA: Insufficient documentation

## 2017-12-31 DIAGNOSIS — I509 Heart failure, unspecified: Secondary | ICD-10-CM | POA: Diagnosis not present

## 2017-12-31 DIAGNOSIS — E11621 Type 2 diabetes mellitus with foot ulcer: Secondary | ICD-10-CM | POA: Diagnosis not present

## 2017-12-31 HISTORY — PX: AMPUTATION: SHX166

## 2017-12-31 LAB — GLUCOSE, CAPILLARY
Glucose-Capillary: 113 mg/dL — ABNORMAL HIGH (ref 65–99)
Glucose-Capillary: 115 mg/dL — ABNORMAL HIGH (ref 65–99)
Glucose-Capillary: 105 mg/dL — ABNORMAL HIGH (ref 65–99)

## 2017-12-31 LAB — HEMOGLOBIN A1C
Hgb A1c MFr Bld: 6.4 % — ABNORMAL HIGH (ref 4.8–5.6)
Mean Plasma Glucose: 136.98 mg/dL

## 2017-12-31 LAB — POCT I-STAT 4, (NA,K, GLUC, HGB,HCT)
Glucose, Bld: 124 mg/dL — ABNORMAL HIGH (ref 65–99)
HCT: 33 % — ABNORMAL LOW (ref 36.0–46.0)
Hemoglobin: 11.2 g/dL — ABNORMAL LOW (ref 12.0–15.0)
Potassium: 3 mmol/L — ABNORMAL LOW (ref 3.5–5.1)
Sodium: 137 mmol/L (ref 135–145)

## 2017-12-31 SURGERY — AMPUTATION, FOOT, RAY
Anesthesia: General | Laterality: Right

## 2017-12-31 MED ORDER — 0.9 % SODIUM CHLORIDE (POUR BTL) OPTIME
TOPICAL | Status: DC | PRN
  Administered 2017-12-31: 1000 mL

## 2017-12-31 MED ORDER — MIDAZOLAM HCL 2 MG/2ML IJ SOLN
INTRAMUSCULAR | Status: AC
  Filled 2017-12-31: qty 2

## 2017-12-31 MED ORDER — CHLORHEXIDINE GLUCONATE 4 % EX LIQD: 60.0000 mL | Freq: Once | CUTANEOUS | Status: DC

## 2017-12-31 MED ORDER — OXYCODONE-ACETAMINOPHEN 5-325 MG PO TABS: 1.0000 | ORAL_TABLET | ORAL | 0 refills | Status: DC | PRN

## 2017-12-31 MED ORDER — ACETAMINOPHEN 160 MG/5ML PO SOLN: 325.0000 mg | ORAL | Status: DC | PRN

## 2017-12-31 MED ORDER — ACETAMINOPHEN 325 MG PO TABS: 325.0000 mg | ORAL_TABLET | ORAL | Status: DC | PRN

## 2017-12-31 MED ORDER — OXYCODONE HCL 5 MG PO TABS
5.0000 mg | ORAL_TABLET | Freq: Once | ORAL | Status: AC | PRN
  Administered 2017-12-31: 5 mg via ORAL

## 2017-12-31 MED ORDER — PROPOFOL 10 MG/ML IV BOLUS
INTRAVENOUS | Status: DC | PRN
  Administered 2017-12-31: 140 mg via INTRAVENOUS

## 2017-12-31 MED ORDER — OXYCODONE HCL 5 MG/5ML PO SOLN: 5.0000 mg | Freq: Once | ORAL | Status: AC | PRN

## 2017-12-31 MED ORDER — OXYCODONE HCL 5 MG PO TABS
ORAL_TABLET | ORAL | Status: AC
  Administered 2017-12-31: 5 mg via ORAL
  Filled 2017-12-31: qty 1

## 2017-12-31 MED ORDER — FENTANYL CITRATE (PF) 100 MCG/2ML IJ SOLN: 25.0000 ug | INTRAMUSCULAR | Status: DC | PRN

## 2017-12-31 MED ORDER — ONDANSETRON HCL 4 MG/2ML IJ SOLN
INTRAMUSCULAR | Status: DC | PRN
  Administered 2017-12-31: 4 mg via INTRAVENOUS

## 2017-12-31 MED ORDER — FENTANYL CITRATE (PF) 250 MCG/5ML IJ SOLN
INTRAMUSCULAR | Status: AC
  Filled 2017-12-31: qty 5

## 2017-12-31 MED ORDER — FENTANYL CITRATE (PF) 250 MCG/5ML IJ SOLN
INTRAMUSCULAR | Status: DC | PRN
  Administered 2017-12-31: 50 ug via INTRAVENOUS

## 2017-12-31 MED ORDER — DEXAMETHASONE SODIUM PHOSPHATE 10 MG/ML IJ SOLN
INTRAMUSCULAR | Status: DC | PRN
  Administered 2017-12-31: 4 mg via INTRAVENOUS

## 2017-12-31 MED ORDER — PROPOFOL 10 MG/ML IV BOLUS
INTRAVENOUS | Status: AC
  Filled 2017-12-31: qty 20

## 2017-12-31 MED ORDER — SODIUM CHLORIDE 0.9 % IV SOLN
INTRAVENOUS | Status: DC
  Administered 2017-12-31 (×2): via INTRAVENOUS

## 2017-12-31 MED ORDER — CEFAZOLIN SODIUM-DEXTROSE 2-4 GM/100ML-% IV SOLN
2.0000 g | INTRAVENOUS | Status: AC
  Administered 2017-12-31: 2 g via INTRAVENOUS
  Filled 2017-12-31: qty 100

## 2017-12-31 MED ORDER — LIDOCAINE HCL (CARDIAC) PF 100 MG/5ML IV SOSY
PREFILLED_SYRINGE | INTRAVENOUS | Status: DC | PRN
  Administered 2017-12-31: 100 mg via INTRAVENOUS

## 2017-12-31 MED ORDER — ONDANSETRON HCL 4 MG/2ML IJ SOLN: 4.0000 mg | Freq: Once | INTRAMUSCULAR | Status: DC | PRN

## 2017-12-31 MED ORDER — MEPERIDINE HCL 50 MG/ML IJ SOLN: 6.2500 mg | INTRAMUSCULAR | Status: DC | PRN

## 2017-12-31 MED ORDER — PHENYLEPHRINE 40 MCG/ML (10ML) SYRINGE FOR IV PUSH (FOR BLOOD PRESSURE SUPPORT)
PREFILLED_SYRINGE | INTRAVENOUS | Status: DC | PRN
  Administered 2017-12-31 (×3): 80 ug via INTRAVENOUS

## 2017-12-31 SURGICAL SUPPLY — 31 items
BLADE SAW SGTL MED 73X18.5 STR (BLADE) IMPLANT
BLADE SURG 21 STRL SS (BLADE) ×2 IMPLANT
BNDG COHESIVE 4X5 TAN STRL (GAUZE/BANDAGES/DRESSINGS) ×2 IMPLANT
BNDG GAUZE ELAST 4 BULKY (GAUZE/BANDAGES/DRESSINGS) ×2 IMPLANT
CANISTER WOUND CARE 500ML ATS (WOUND CARE) ×2 IMPLANT
COVER SURGICAL LIGHT HANDLE (MISCELLANEOUS) ×4 IMPLANT
DRAPE INCISE IOBAN 66X45 STRL (DRAPES) ×2 IMPLANT
DRAPE U-SHAPE 47X51 STRL (DRAPES) ×4 IMPLANT
DRSG ADAPTIC 3X8 NADH LF (GAUZE/BANDAGES/DRESSINGS) ×2 IMPLANT
DRSG PAD ABDOMINAL 8X10 ST (GAUZE/BANDAGES/DRESSINGS) ×4 IMPLANT
DURAPREP 26ML APPLICATOR (WOUND CARE) ×2 IMPLANT
ELECT REM PT RETURN 9FT ADLT (ELECTROSURGICAL) ×2
ELECTRODE REM PT RTRN 9FT ADLT (ELECTROSURGICAL) ×1 IMPLANT
GAUZE SPONGE 4X4 12PLY STRL (GAUZE/BANDAGES/DRESSINGS) ×2 IMPLANT
GLOVE BIOGEL PI IND STRL 9 (GLOVE) ×1 IMPLANT
GLOVE BIOGEL PI INDICATOR 9 (GLOVE) ×1
GLOVE SURG ORTHO 9.0 STRL STRW (GLOVE) ×2 IMPLANT
GOWN STRL REUS W/ TWL XL LVL3 (GOWN DISPOSABLE) ×2 IMPLANT
GOWN STRL REUS W/TWL XL LVL3 (GOWN DISPOSABLE) ×2
KIT BASIN OR (CUSTOM PROCEDURE TRAY) ×2 IMPLANT
KIT DRSG PREVENA PLUS 7DAY 125 (MISCELLANEOUS) ×2 IMPLANT
KIT PREVENA INCISION MGT 13 (CANNISTER) ×2 IMPLANT
KIT TURNOVER KIT B (KITS) ×2 IMPLANT
NS IRRIG 1000ML POUR BTL (IV SOLUTION) ×2 IMPLANT
PACK ORTHO EXTREMITY (CUSTOM PROCEDURE TRAY) ×2 IMPLANT
PAD ARMBOARD 7.5X6 YLW CONV (MISCELLANEOUS) ×4 IMPLANT
STOCKINETTE IMPERVIOUS LG (DRAPES) IMPLANT
SUT ETHILON 2 0 PSLX (SUTURE) ×2 IMPLANT
TOWEL OR 17X26 10 PK STRL BLUE (TOWEL DISPOSABLE) ×2 IMPLANT
TUBE CONNECTING 12X1/4 (SUCTIONS) ×2 IMPLANT
YANKAUER SUCT BULB TIP NO VENT (SUCTIONS) ×2 IMPLANT

## 2017-12-31 NOTE — H&P (Signed)
Destiny Day is an 67 y.o. female.   Chief Complaint: Osteomyelitis ulceration gangrene right forefoot HPI: Patient is a 67 year old one woman with diabetic insensate neuropathy peripheral vascular disease end-stage renal disease on dialysis Monday Wednesday Friday who presents with a necrotic ulcerated infection of the right second toe she denies any systemic symptoms.  Patient complains of venous stasis swelling in her leg as well.     Past Medical History:  Diagnosis Date  . Abdominal bruit   . Anemia   . Anxiety   . Arthritis    Osteoarthritis  . Asthma   . Cervical disc disease    "pinced nerve"  . CHF (congestive heart failure) ([place])   . Complication of anesthesia    " after I got home from my last procedure, I started itching."  . Diabetes mellitus    Type II  . Diverticulitis   . ESRD on peritoneal dialysis [facility])    Hemodialysis - MWF- [place]   . GERD (gastroesophageal reflux disease)    from medications  . GI bleed 03/31/2013  . History of hiatal hernia   . Hyperlipidemia   . Hypertension   . Neuropathy    left leg  . Osteoporosis   . Peripheral vascular disease ([place])   . Pneumonia    "very young"  . Seasonal allergies   . Shortness of breath dyspnea    WIth exertion  . Sleep apnea    can't afford cpap    Past Surgical History:  Procedure Laterality Date  . A/V SHUNTOGRAM N/A 09/22/2016   Procedure: A/V Shuntogram - left arm;  Surgeon: Serafina Mitchell, MD;  Location: [place] [facility];  Service: Cardiovascular;  Laterality: N/A;  . ABDOMINAL HYSTERECTOMY  1993`  . AMPUTATION Left 09/01/2016   Procedure: LEFT FOOT TRANSMETATARSAL AMPUTATION;  Surgeon: Newt Minion, MD;  Location: [place];  Service: Orthopedics;  Laterality: Left;  . AMPUTATION Right 08/11/2017   Procedure: RIGHT GREAT TOE AMPUTATION DIGIT;  Surgeon: Rosetta Posner, MD;  Location: [facility] OR;  Service: Vascular;  Laterality: Right;  . AV FISTULA PLACEMENT Left 04/21/2016   Procedure: INSERTION OF  ARTERIOVENOUS (AV) GORE-TEX GRAFT ARM LEFT;  Surgeon: Elam Dutch, MD;  Location: [place];  Service: Vascular;  Laterality: Left;  .  TRANSPOSITION Left 07/10/2014   Procedure: BASCILIC VEIN TRANSPOSITION;  Surgeon: Angelia Mould, MD;  Location: [place];  Service: Vascular;  Laterality: Left;  . BASCILIC VEIN TRANSPOSITION Right 11/08/2014   Procedure: FIRST STAGE BASILIC VEIN TRANSPOSITION;  Surgeon: Angelia Mould, MD;  Location: [place];  Service: Vascular;  Laterality: Right;  . BASCILIC VEIN TRANSPOSITION Right 01/18/2015   Procedure: SECOND STAGE BASILIC VEIN TRANSPOSITION;  Surgeon: Angelia Mould, MD;  Location: [place];  Service: Vascular;  Laterality: Right;  . CRANIOTOMY N/A 08/23/2017   Procedure: CRANIOTOMY HEMATOMA EVACUATION SUBDURAL;  Surgeon: Ashok Pall, MD;  Location: [place];  Service: Neurosurgery;  Laterality: N/A;  . CRANIOTOMY Left 08/24/2017   Procedure: CRANIOTOMY FOR RECURRENT ACUTE SUBDURAL HEMATOMA;  Surgeon: Ashok Pall, MD;  Location: [place];  Service: Neurosurgery;  Laterality: Left;  . ESOPHAGOGASTRODUODENOSCOPY N/A 03/31/2013   Procedure: ESOPHAGOGASTRODUODENOSCOPY (EGD);  Surgeon: Gatha Mayer, MD;  Location: [facility] ENDOSCOPY;  Service: Endoscopy;  Laterality: N/A;  . EYE SURGERY     laser surgery  . FEMORAL-POPLITEAL BYPASS GRAFT Left 07/08/2016   Procedure: LEFT  FEMORAL-BELOW KNEE POPLITEAL ARTERY BYPASS GRAFT USING 6MM X 80 CM PROPATEN GORETEX  GRAFT WITH RINGS.;  Surgeon: Rosetta Posner, MD;  Location: [place];  Service: Vascular;  Laterality: Left;  . FEMORAL-POPLITEAL BYPASS GRAFT Right 08/11/2017   Procedure: RIGHT FEMORAL TO BELOW KNEE POPLITEAL ARTERKY  BYPASS GRAFT USING 6MM RINGED PROPATEN GRAFT;  Surgeon: Rosetta Posner, MD;  Location: [place];  Service: Vascular;  Laterality: Right;  . FISTULOGRAM Left 10/29/2014   Procedure: FISTULOGRAM;  Surgeon: Angelia Mould, MD;  Location: [facility] CATH LAB;  Service: Cardiovascular;   Laterality: Left;  . LIGATION OF ARTERIOVENOUS  FISTULA Left 04/21/2016   Procedure: LIGATION OF ARTERIOVENOUS  FISTULA LEFT ARM;  Surgeon: Elam Dutch, MD;  Location: [place];  Service: Vascular;  Laterality: Left;  . LOWER EXTREMITY ANGIOGRAPHY N/A 04/06/2017   Procedure: Lower Extremity Angiography - Right;  Surgeon: Serafina Mitchell, MD;  Location: [place] [facility];  Service: Cardiovascular;  Laterality: N/A;  . PATCH ANGIOPLASTY Right 01/18/2015   Procedure: BASILIC VEIN PATCH ANGIOPLASTY USING VASCUGUARD PATCH;  Surgeon: Angelia Mould, MD;  Location: [place];  Service: Vascular;  Laterality: Right;  . PERIPHERAL VASCULAR BALLOON ANGIOPLASTY  09/22/2016   Procedure: Peripheral Vascular Balloon Angioplasty;  Surgeon: Serafina Mitchell, MD;  Location: [place] [facility];  Service: Cardiovascular;;  Lt. Fistula  . PERIPHERAL VASCULAR CATHETERIZATION N/A 06/23/2016   Procedure: Abdominal Aortogram w/Lower Extremity;  Surgeon: Serafina Mitchell, MD;  Location: [place] [facility];  Service: Cardiovascular;  Laterality: N/A;  . PERIPHERAL VASCULAR CATHETERIZATION  06/23/2016   Procedure: Peripheral Vascular Intervention;  Surgeon: Serafina Mitchell, MD;  Location: [place] [facility] LAB;  Service: Cardiovascular;;  lt common and external illiac artery    Family History  Problem Relation Age of Onset  . Other Mother        not sure of cause of death  . Diabetes Father   . Pancreatic cancer Maternal Grandmother   . Colon cancer Neg Hx    Social History:  reports that she quit smoking about 5 years ago. Her smoking use included cigarettes. She has a 14.00 pack-year smoking history. She has never used smokeless tobacco. She reports that she does not drink alcohol or use drugs.  Allergies:  Allergies  Allergen Reactions  . Lisinopril Cough  . Prednisone Swelling and Other (See Comments)    Excessive fluid buildup    Medications Prior to Admission  Medication Sig Dispense Refill  . aspirin EC  81 MG tablet Take 81 mg by mouth daily.    [place] Kitchen atorvastatin (LIPITOR) 20 MG tablet Take 20 mg by mouth at bedtime.     . carvedilol (COREG) 12.5 MG tablet Take 12.5 mg by mouth 2 (two) times daily with a meal.    . cloNIDine (CATAPRES) 0.2 MG tablet Take 1 tablet (0.2 mg total) by mouth 3 (three) times daily. 90 tablet 0  . clopidogrel (PLAVIX) 75 MG tablet Take 75 mg by mouth daily.    . diazepam (VALIUM) 5 MG tablet Take 5 mg by mouth 2 (two) times daily as needed for anxiety.    Marland Kitchen doxycycline (VIBRA-TABS) 100 MG tablet Take 1 tablet (100 mg total) by mouth 2 (two) times daily. 60 tablet 0  . fluticasone (FLONASE) 50 MCG/ACT nasal spray Place 1 spray into both nostrils daily as needed for allergies or rhinitis.    Marland Kitchen gabapentin (NEURONTIN) 100 MG capsule Take 1 capsule (100 mg total) by mouth at bedtime. When necessary for neuropathy pain 30 capsule 3  . hydrALAZINE (  APRESOLINE) 50 MG tablet Take 1 tab 2 times a day on non dialysis days. (Patient taking differently: Take 50 mg by mouth 2 (two) times daily. ) 60 tablet 0  . insulin NPH Human (HUMULIN N,NOVOLIN N) 100 UNIT/ML injection Inject 10 Units into the skin at bedtime.    . insulin regular (NOVOLIN R,HUMULIN R) 100 units/mL injection Inject 3-8 Units into the skin 3 (three) times daily before meals. Per sliding scale    . loratadine (CLARITIN) 10 MG tablet Take 10 mg by mouth daily as needed for allergies.    . multivitamin (RENA-VIT) TABS tablet Take 1 tablet by mouth daily.   5  . omeprazole (PRILOSEC) 20 MG capsule Take 20 mg by mouth daily.     Marland Kitchen albuterol (PROVENTIL HFA;VENTOLIN HFA) 108 (90 BASE) MCG/ACT inhaler Inhale 1-2 puffs into the lungs every 6 (six) hours as needed for wheezing or shortness of breath.    . Amino Acids-Protein Hydrolys (FEEDING SUPPLEMENT, PRO-STAT SUGAR FREE 64,) LIQD Take 30 mLs by mouth 2 (two) times daily. (Patient not taking: Reported on 12/29/2017) 900 mL 0  . budesonide-formoterol (SYMBICORT) 160-4.5 MCG/ACT  inhaler Inhale 1 puff into the lungs daily as needed (for respiratory issues).     . Dakins (SODIUM HYPOCHLORITE) external solution Apply topically daily. Cleanse groin and cover with damp gauze soaked in dakins. (Patient not taking: Reported on 12/29/2017) 473 mL 0  . hydrocerin (EUCERIN) CREA Apply 779 application topically 2 (two) times daily. (Patient not taking: Reported on 12/29/2017) 228 g 0  . LEVEMIR FLEXTOUCH 100 UNIT/ML Pen Inject 7 Units into the skin at bedtime. (Patient not taking: Reported on 12/29/2017) 15 mL 11  . polyethylene glycol (MIRALAX / GLYCOLAX) packet Take 17 g by mouth daily. (Patient taking differently: Take 17 g by mouth daily as needed for moderate constipation. ) 30 each 0    Results for orders placed or performed during the hospital encounter of 12/31/17 (from the past 48 hour(s))  Hemoglobin A1c     Status: Abnormal   Collection Time: 12/31/17  9:40 AM  Result Value Ref Range   Hgb A1c MFr Bld 6.4 (H) 4.8 - 5.6 %    Comment: (NOTE) Pre diabetes:          5.7%-6.4% Diabetes:              >6.4% Glycemic control for   <7.0% adults with diabetes    Mean Plasma Glucose 136.98 mg/dL    Comment: Performed at [facility], [place] [address], [place], [place] [address]  I-STAT 4, (NA,K, GLUC, HGB,HCT)     Status: Abnormal   Collection Time: 12/31/17  9:42 AM  Result Value Ref Range   Sodium 137 135 - 145 mmol/L   Potassium 3.0 (L) 3.5 - 5.1 mmol/L   Glucose, Bld 124 (H) 65 - 99 mg/dL   HCT 33.0 (L) 36.0 - 46.0 %   Hemoglobin 11.2 (L) 12.0 - 15.0 g/dL   No results found.  Review of Systems  All other systems reviewed and are negative.   Blood pressure (!) 132/50, pulse 76, temperature 98.3 F (36.8 C), temperature source Oral, resp. rate 18, height 5\' 5"  (1.651 m), weight 145 lb (65.8 kg), SpO2 100 %. Physical Exam  Patient is alert, oriented, no adenopathy, well-dressed, normal affect, normal respiratory effort. Examination patient has a palpable dorsalis  pedis and posterior tibial pulse.  She has venous stasis changes in her right leg with brawny skin color  and swelling but no open ulcers.  She has necrotic wound over the right second toe.  After informed consent the ulcer was debrided of skin and soft tissue back to bleeding viable granulation tissue this was touched with silver nitrate the ulcer is 2 cm in diameter she has sausage digit swelling of the toe with exposed bone there is no ascending cellulitis.   Assessment/Plan Assessment: Gangrene ulceration right forefoot  Plan: Patient would like to proceed with surgery towards the end of the weeks or her husband can be home to take care of her.  We will plan for surgery next Friday she will change her dialysis to Thursday and she wishes to be discharged after surgery and then she will resume her dialysis.  A prescription for doxycycline was called in.  Discussed with the patient we could proceed with a second toe amputation.  Patient states she is not interested in further toe amputations and wants to proceed with a transmetatarsal amputation.    Newt Minion, MD 12/31/2017, 11:47 AM

## 2017-12-31 NOTE — Anesthesia Preprocedure Evaluation (Addendum)
Anesthesia Evaluation  Patient identified by MRN, date of birth, ID band Patient confused    Reviewed: Allergy & Precautions, NPO status , Patient's Chart, lab work & pertinent test resultsPreop documentation limited or incomplete due to emergent nature of procedure.  History of Anesthesia Complications (+) history of anesthetic complications  Airway Mallampati: II  TM Distance: >3 FB Neck ROM: Full    Dental  (+) Missing, Loose, Chipped, Poor Dentition   Pulmonary shortness of breath and with exertion, asthma , sleep apnea , former smoker,    + rhonchi        Cardiovascular hypertension, Pt. on medications + Peripheral Vascular Disease and +CHF  Normal cardiovascular exam Rhythm:Regular Rate:Normal  Echo 10/09/15: Study Conclusions  - Left ventricle: The cavity size was normal. There was severe concentric hypertrophy. Systolic function was normal. The estimated ejection fraction was in the range of 55% to 60%. Wall motion was normal; there were no regional wall motion abnormalities. Features are consistent with a pseudonormal left ventricular filling pattern, with concomitant abnormal relaxation and increased filling pressure (grade 2 diastolic dysfunction).   Doppler parameters are consistent with high ventricular filling pressure. - Aortic valve: Mildly calcified annulus. Trileaflet; normal   thickness, mildly calcified leaflets. Transvalvular velocity was within the normal range. There was no stenosis. There was no regurgitation. - Mitral valve: Calcified annulus. Transvalvular velocity was   within the normal range. There was no evidence for stenosis.   There was no regurgitation. - Left atrium: The atrium was moderately dilated. - Right ventricle: The cavity size was normal. Wall thickness was normal. Systolic function was normal. - Atrial septum: No defect or patent foramen ovale was identified. - Inferior vena cava: The  vessel was normal in size. The   respirophasic diameter changes were in the normal range (>= 50%), consistent with normal central venous pressure.   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     Musculoskeletal  (+) Arthritis ,   Abdominal   Peds  Hematology  (+) Blood dyscrasia, anemia , Currently receiving 2nd pRBC for Hgb of 6.1   Anesthesia Other Findings Day of surgery medications reviewed with the patient.  Reproductive/Obstetrics                            Anesthesia Physical  Anesthesia Plan  ASA: III  Anesthesia Plan: General   Post-op Pain Management:    Induction: Intravenous  PONV Risk Score and Plan: 3 and Midazolam, Ondansetron, Dexamethasone and Treatment may vary due to age or medical condition  Airway Management Planned: Oral ETT and LMA  Additional Equipment: Arterial line  Intra-op Plan:   Post-operative Plan: Post-operative intubation/ventilation  Informed Consent: I have reviewed the patients History and Physical, chart, labs and discussed the procedure including the risks, benefits and alternatives for the proposed anesthesia with the patient or authorized representative who has indicated his/her understanding and acceptance.   Dental advisory given  Plan Discussed with: CRNA, Anesthesiologist and Surgeon  Anesthesia Plan Comments:         Anesthesia Quick Evaluation

## 2017-12-31 NOTE — Telephone Encounter (Signed)
Pt will d/c from the hospital today s/p a transmet amputation. Requesting referral for [facility] for physical therapy. Called [facility] [phone] they advised to fax order to intake dept with demo sheet [phone] this has been faxed to call with any questions. Asked them to contact the pt directly to set up time.

## 2017-12-31 NOTE — Transfer of Care (Signed)
Immediate Anesthesia Transfer of Care Note  Patient: Destiny Day  Procedure(s) Performed: RIGHT TRANSMETATARSAL AMPUTATION (Right )  Patient Location: PACU  Anesthesia Type:General  Level of Consciousness: drowsy  Airway & Oxygen Therapy: Patient Spontanous Breathing and Patient connected to face mask oxygen  Post-op Assessment: Report given to RN and Post -op Vital signs reviewed and stable  Post vital signs: Reviewed and stable  Last Vitals:  Vitals Value Taken Time  BP 133/58 12/31/2017  1:29 PM  Temp    Pulse 66 12/31/2017  1:30 PM  Resp 16 12/31/2017  1:30 PM  SpO2 95 % 12/31/2017  1:30 PM  Vitals shown include unvalidated device data.  Last Pain:  Vitals:   12/31/17 0948  TempSrc:   PainSc: 0-No pain      Patients Stated Pain Goal: 2 (03/50/09 3818)  Complications: No apparent anesthesia complications

## 2017-12-31 NOTE — Progress Notes (Signed)
Orthopedic Tech Progress Note Patient Details:  Destiny Day 1951/05/01 [id number]  Ortho Devices Type of Ortho Device: Postop shoe/boot Ortho Device/Splint Interventions: Application   Post Interventions Patient Tolerated: Well Instructions Provided: Care of device   [name] 12/31/2017, 2:24 PM

## 2017-12-31 NOTE — Anesthesia Procedure Notes (Signed)
Procedure Name: LMA Insertion Date/Time: 12/31/2017 12:51 PM Performed by: Julieta Bellini, CRNA Pre-anesthesia Checklist: Patient identified, Emergency Drugs available, Suction available and Patient being monitored Patient Re-evaluated:Patient Re-evaluated prior to induction Oxygen Delivery Method: Circle system utilized Preoxygenation: Pre-oxygenation with 100% oxygen Induction Type: IV induction Ventilation: Mask ventilation without difficulty LMA: LMA inserted LMA Size: 4.0 Number of attempts: 1 Placement Confirmation: positive ETCO2 and breath sounds checked- equal and bilateral Tube secured with: Tape Dental Injury: Teeth and Oropharynx as per pre-operative assessment

## 2017-12-31 NOTE — Anesthesia Postprocedure Evaluation (Signed)
Anesthesia Post Note  Patient: Destiny Day  Procedure(s) Performed: RIGHT TRANSMETATARSAL AMPUTATION (Right )     Patient location during evaluation: PACU Anesthesia Type: General Level of consciousness: awake and alert Pain management: pain level controlled Vital Signs Assessment: post-procedure vital signs reviewed and stable Respiratory status: spontaneous breathing, nonlabored ventilation, respiratory function stable and patient connected to nasal cannula oxygen Cardiovascular status: blood pressure returned to baseline and stable Postop Assessment: no apparent nausea or vomiting Anesthetic complications: no    Last Vitals:  Vitals:   12/31/17 1415 12/31/17 1445  BP: (!) 158/60 (!) 153/59  Pulse: 70 68  Resp: 15 17  Temp:  (!) 36.4 C  SpO2: 92% 96%    Last Pain:  Vitals:   12/31/17 1445  TempSrc:   PainSc: 0-No pain                 Cuauhtemoc Huegel

## 2017-12-31 NOTE — Op Note (Signed)
12/31/2017  1:21 PM  PATIENT:  Destiny Day    PRE-OPERATIVE DIAGNOSIS:  Osteomyelitis Right 2nd Toe  POST-OPERATIVE DIAGNOSIS:  Same  PROCEDURE:  RIGHT TRANSMETATARSAL AMPUTATION Application Praveena wound VAC.  SURGEON:  Newt Minion, MD  PHYSICIAN ASSISTANT:None ANESTHESIA:   General  PREOPERATIVE INDICATIONS:  Destiny Day is a  67 y.o. female with a diagnosis of Osteomyelitis Right 2nd Toe who failed conservative measures and elected for surgical management.    The risks benefits and alternatives were discussed with the patient preoperatively including but not limited to the risks of infection, bleeding, nerve injury, cardiopulmonary complications, the need for revision surgery, among others, and the patient was willing to proceed.  OPERATIVE IMPLANTS: Praveena wound VAC  @ENCIMAGES @  OPERATIVE FINDINGS: Minimal petechial bleeding.  OPERATIVE PROCEDURE: Patient was brought the operating room and underwent a general anesthetic.  After adequate levels of anesthesia were obtained patient's right lower extremity was prepped using DuraPrep draped into a sterile field a timeout was called.  A fishmouth incision was made just proximal to the ulcerative tissue.  This was carried down to bone.  A transmetatarsal amputation was performed through the metatarsal shaft beveled plantarly.  Electrocautery was used for hemostasis the wound was irrigated with normal saline.  There were calcified arterial vessels and minimal petechial bleeding.  The wound was closed using 2-0 nylon.  A Praveena wound VAC was applied this had a good suction fit patient was extubated taken the PACU in stable condition plan for discharge to home.   DISCHARGE PLANNING:  Antibiotic duration: Perioperative  Weightbearing: Nonweightbearing on the right  Pain medication: Prescription for Percocet  Dressing care/ Wound VAC: Wound VAC do not remove for 1 week  Ambulatory devices: Walker.  Discharge to:  Home.  Follow-up: In the office 1 week post operative.

## 2018-01-01 ENCOUNTER — Encounter (HOSPITAL_COMMUNITY): Payer: Self-pay | Admitting: Orthopedic Surgery

## 2018-01-03 ENCOUNTER — Telehealth (INDEPENDENT_AMBULATORY_CARE_PROVIDER_SITE_OTHER): Payer: Self-pay | Admitting: Orthopedic Surgery

## 2018-01-03 DIAGNOSIS — D509 Iron deficiency anemia, unspecified: Secondary | ICD-10-CM | POA: Diagnosis not present

## 2018-01-03 DIAGNOSIS — E876 Hypokalemia: Secondary | ICD-10-CM | POA: Diagnosis not present

## 2018-01-03 DIAGNOSIS — E1122 Type 2 diabetes mellitus with diabetic chronic kidney disease: Secondary | ICD-10-CM | POA: Diagnosis not present

## 2018-01-03 DIAGNOSIS — N186 End stage renal disease: Secondary | ICD-10-CM | POA: Diagnosis not present

## 2018-01-03 DIAGNOSIS — D631 Anemia in chronic kidney disease: Secondary | ICD-10-CM | POA: Diagnosis not present

## 2018-01-03 DIAGNOSIS — N2581 Secondary hyperparathyroidism of renal origin: Secondary | ICD-10-CM | POA: Diagnosis not present

## 2018-01-03 NOTE — Telephone Encounter (Signed)
[facility] called wanting to move the start date of PT to next week around 6/20, just needing a new start date to be approved. CB # [phone]

## 2018-01-04 ENCOUNTER — Ambulatory Visit (INDEPENDENT_AMBULATORY_CARE_PROVIDER_SITE_OTHER): Payer: Medicare Other | Admitting: Orthopedic Surgery

## 2018-01-04 ENCOUNTER — Encounter (INDEPENDENT_AMBULATORY_CARE_PROVIDER_SITE_OTHER): Payer: Self-pay | Admitting: Orthopedic Surgery

## 2018-01-04 VITALS — Ht 65.0 in | Wt 145.0 lb

## 2018-01-04 DIAGNOSIS — Z89431 Acquired absence of right foot: Secondary | ICD-10-CM

## 2018-01-04 NOTE — Telephone Encounter (Signed)
Called and gave verbal ok to start PT next week. Pt's husband told HHPT that they wanted to wait until after her first post op appt.

## 2018-01-04 NOTE — Progress Notes (Signed)
Office Visit Note   Patient: Destiny Day           Date of Birth: 1951-07-18           MRN: [id number] Visit Date: 01/04/2018              Requested by: Velna Hatchet, MD [name] [place], [place] [address] PCP: Velna Hatchet, MD  Chief Complaint  Patient presents with  . Right Foot - Routine Post Op    12/31/17 right transmet amputation with vac      HPI: Patient is a 67 year old woman diabetic insensate neuropathy end-stage renal disease on dialysis who is 4 days status post right transmetatarsal amputation.  The wound VAC is removed there is some clear serosanguineous drainage there is no wound dehiscence no ischemic skin changes.  Assessment & Plan: Visit Diagnoses:  1. S/P transmetatarsal amputation of foot, right ([place])     Plan: Follow-up with ordered on Friday for dressing change and follow-up with myself next Tuesday.  We will need to advance her to medical compression stockings.  Follow-Up Instructions: Return in about 1 week (around 01/11/2018).   Ortho Exam  Patient is alert, oriented, no adenopathy, well-dressed, normal affect, normal respiratory effort. Examination the wound shows no signs of infection.  Patient will continue with her antibiotics for a week.  Imaging: No results found. No images are attached to the encounter.  Labs: Lab Results  Component Value Date   HGBA1C 6.4 (H) 12/31/2017   HGBA1C 5.0 09/03/2017   HGBA1C 7.0 (H) 08/11/2017   REPTSTATUS 08/25/2017 FINAL 08/24/2017   CULT NO GROWTH 08/24/2017   LABORGA STAPHYLOCOCCUS AUREUS 11/29/2008     Lab Results  Component Value Date   ALBUMIN 2.5 (L) 09/13/2017   ALBUMIN 2.5 (L) 09/10/2017   ALBUMIN 2.5 (L) 09/08/2017    Body mass index is 24.13 kg/m.  Orders:  No orders of the defined types were placed in this encounter.  No orders of the defined types were placed in this encounter.    Procedures: No procedures performed  Clinical Data: No additional  findings.  ROS:  All other systems negative, except as noted in the HPI. Review of Systems  Objective: Vital Signs: Ht 5\' 5"  (1.651 m)   Wt 145 lb (65.8 kg)   BMI 24.13 kg/m   Specialty Comments:  No specialty comments available.  PMFS History: Patient Active Problem List   Diagnosis Date Noted  . Subacute osteomyelitis, right ankle and foot ([place])   . Gangrene of toe of right foot ([place]) 12/23/2017  . Ulcer of toe of right foot, limited to breakdown of skin ([place]) 11/09/2017  . Achilles tendon contracture, right 11/09/2017  . Labile blood glucose   . Anemia of infection and chronic disease   . Essential hypertension   . Black stool   . Right sided weakness   . Subdural hemorrhage following injury ([place]) 08/30/2017  . Diabetic peripheral neuropathy ([place])   . Chronic obstructive pulmonary disease ([place])   . Seizure prophylaxis   . Subdural hematoma ([place]) 08/24/2017  . Traumatic subdural hematoma ([place]) 08/23/2017  . Fall   . SDH (subdural hematoma) ([place])   . Acute respiratory failure with hypoxia ([place])   . Diabetes mellitus type 2 in nonobese (HCC)   . Leukocytosis   . Labile blood pressure   . Generalized anxiety disorder   . Debility 08/16/2017  . Amputated great toe of right foot ([place])   . Amputated toe of left  foot ([place])   . Post-operative pain   . ESRD on dialysis ([place])   . Diabetes mellitus ([place])   . Anxiety state   . Benign essential HTN   . Acute blood loss anemia   . Anemia of chronic disease   . Idiopathic chronic venous hypertension of both lower extremities with inflammation 10/13/2016  . S/P transmetatarsal amputation of foot, left ([place]) 09/21/2016  . Onychomycosis 08/15/2016  . Gangrene of toe of left foot ([place])   . PAD (peripheral artery disease) ([place]) 07/08/2016  . Atherosclerosis of native artery of left lower extremity with gangrene ([place]) 06/23/2016  . GERD (gastroesophageal reflux disease) 10/07/2015  . ARF (acute renal failure) ([place])   .  Hypothermia 06/12/2015  . Acute on chronic renal failure ([place]) 06/12/2015  . CHF (congestive heart failure) ([place]) 07/30/2014  . CKD (chronic kidney disease) stage 4, GFR 15-29 ml/min (HCC) 06/27/2014  . Hypertensive heart disease 05/25/2013  . CKD (chronic kidney disease) stage 3, GFR 30-59 ml/min (HCC) 05/25/2013  . Pulmonary edema 05/23/2013  . Type 2 diabetes mellitus with diabetic nephropathy ([place]) 05/23/2013  . Type 2 diabetes mellitus with hyperosmolar nonketotic hyperglycemia ([place]) 04/13/2013  . Chronic diastolic CHF (congestive heart failure) ([place]) 04/13/2013  . UGI bleed 03/31/2013  . Hypokalemia 08/24/2012  . Type II or unspecified type diabetes mellitus without mention of complication, not stated as uncontrolled 12/27/2007  . HLD (hyperlipidemia) 12/27/2007  . ALLERGIC RHINITIS 12/27/2007  . Asthma 12/27/2007   Past Medical History:  Diagnosis Date  . Abdominal bruit   . Anemia   . Anxiety   . Arthritis    Osteoarthritis  . Asthma   . Cervical disc disease    "pinced nerve"  . CHF (congestive heart failure) ([place])   . Complication of anesthesia    " after I got home from my last procedure, I started itching."  . Diabetes mellitus    Type II  . Diverticulitis   . ESRD on peritoneal dialysis [facility])    Hemodialysis - MWF- [place]   . GERD (gastroesophageal reflux disease)    from medications  . GI bleed 03/31/2013  . History of hiatal hernia   . Hyperlipidemia   . Hypertension   . Neuropathy    left leg  . Osteoporosis   . Peripheral vascular disease ([place])   . Pneumonia    "very young"  . Seasonal allergies   . Shortness of breath dyspnea    WIth exertion  . Sleep apnea    can't afford cpap    Family History  Problem Relation Age of Onset  . Other Mother        not sure of cause of death  . Diabetes Father   . Pancreatic cancer Maternal Grandmother   . Colon cancer Neg Hx     Past Surgical History:  Procedure Laterality Date  . A/V SHUNTOGRAM N/A  09/22/2016   Procedure: A/V Shuntogram - left arm;  Surgeon: Serafina Mitchell, MD;  Location: [facility];  Service: Cardiovascular;  Laterality: N/A;  . ABDOMINAL HYSTERECTOMY  1993`  . AMPUTATION Left 09/01/2016   Procedure: LEFT FOOT TRANSMETATARSAL AMPUTATION;  Surgeon: Newt Minion, MD;  Location: [facility];  Service: Orthopedics;  Laterality: Left;  . AMPUTATION Right 08/11/2017   Procedure: RIGHT GREAT TOE AMPUTATION DIGIT;  Surgeon: Rosetta Posner, MD;  Location: [facility];  Service: Vascular;  Laterality: Right;  . AMPUTATION Right 12/31/2017   Procedure: RIGHT TRANSMETATARSAL AMPUTATION;  Surgeon: Newt Minion, MD;  Location: [place];  Service: Orthopedics;  Laterality: Right;  . AV FISTULA PLACEMENT Left 04/21/2016   Procedure: INSERTION OF ARTERIOVENOUS (AV) GORE-TEX GRAFT ARM LEFT;  Surgeon: Elam Dutch, MD;  Location: [place];  Service: Vascular;  Laterality: Left;  . [place] TRANSPOSITION Left 07/10/2014   Procedure: BASCILIC VEIN TRANSPOSITION;  Surgeon: Angelia Mould, MD;  Location: [place];  Service: Vascular;  Laterality: Left;  . BASCILIC VEIN TRANSPOSITION Right 11/08/2014   Procedure: FIRST STAGE BASILIC VEIN TRANSPOSITION;  Surgeon: Angelia Mould, MD;  Location: [place];  Service: Vascular;  Laterality: Right;  . BASCILIC VEIN TRANSPOSITION Right 01/18/2015   Procedure: SECOND STAGE BASILIC VEIN TRANSPOSITION;  Surgeon: Angelia Mould, MD;  Location: [place];  Service: Vascular;  Laterality: Right;  . CRANIOTOMY N/A 08/23/2017   Procedure: CRANIOTOMY HEMATOMA EVACUATION SUBDURAL;  Surgeon: Ashok Pall, MD;  Location: [facility];  Service: Neurosurgery;  Laterality: N/A;  . CRANIOTOMY Left 08/24/2017   Procedure: CRANIOTOMY FOR RECURRENT ACUTE SUBDURAL HEMATOMA;  Surgeon: Ashok Pall, MD;  Location: [place];  Service: Neurosurgery;  Laterality: Left;  . ESOPHAGOGASTRODUODENOSCOPY N/A 03/31/2013   Procedure: ESOPHAGOGASTRODUODENOSCOPY (EGD);  Surgeon: Gatha Mayer, MD;  Location: [facility] ENDOSCOPY;  Service: Endoscopy;  Laterality: N/A;  . EYE SURGERY     laser surgery  . FEMORAL-POPLITEAL BYPASS GRAFT Left 07/08/2016   Procedure: LEFT  FEMORAL-BELOW KNEE POPLITEAL ARTERY BYPASS GRAFT USING 6MM X 80 CM PROPATEN GORETEX GRAFT WITH RINGS.;  Surgeon: Rosetta Posner, MD;  Location: [place];  Service: Vascular;  Laterality: Left;  . FEMORAL-POPLITEAL BYPASS GRAFT Right 08/11/2017   Procedure: RIGHT FEMORAL TO BELOW KNEE POPLITEAL ARTERKY  BYPASS GRAFT USING 6MM RINGED PROPATEN GRAFT;  Surgeon: Rosetta Posner, MD;  Location: [place];  Service: Vascular;  Laterality: Right;  . FISTULOGRAM Left 10/29/2014   Procedure: FISTULOGRAM;  Surgeon: Angelia Mould, MD;  Location[facility] C[facility] LAB;  Service: Cardiovascular;  Laterality: Left;  . LIGATION OF ARTERIOVENOUS  FISTULA Left 04/21/2016   Procedure: LIGATION OF ARTERIOVENOUS  FISTULA LEFT ARM;  Surgeon: Elam Dutch, MD;  Location: [place];  Service: Vascular;  Laterality: Left;  . LOWER EXTREMITY ANGIOGRAPHY N/A 04/06/2017   Procedure: Lower Extremity Angiography - Right;  Surgeon: Serafina Mitchell, MD;  Location: [place] [facility];  Service: Cardiovascular;  Laterality: N/A;  . PATCH ANGIOPLASTY Right 01/18/2015   Procedure: BASILIC VEIN PATCH ANGIOPLASTY USING VASCUGUARD PATCH;  Surgeon: Angelia Mould, MD;  Location: [place];  Service: Vascular;  Laterality: Right;  . PERIPHERAL VASCULAR BALLOON ANGIOPLASTY  09/22/2016   Procedure: Peripheral Vascular Balloon Angioplasty;  Surgeon: Serafina Mitchell, MD;  Location: [place] [facility];  Service: Cardiovascular;;  Lt. Fistula  . PERIPHERAL VASCULAR CATHETERIZATION N/A 06/23/2016   Procedure: Abdominal Aortogram w/Lower Extremity;  Surgeon: Serafina Mitchell, MD;  Location: [place] [facility];  Service: Cardiovascular;  Laterality: N/A;  . PERIPHERAL VASCULAR CATHETERIZATION  06/23/2016   Procedure: Peripheral Vascular Intervention;  Surgeon: Serafina Mitchell, MD;   Location: [place] [facility];  Service: Cardiovascular;;  lt common and external illiac artery   Social History   Occupational History  . Occupation: Retired  Tobacco Use  . Smoking status: Former Smoker    Packs/day: 0.35    Years: 40.00    Pack years: 14.00    Types: Cigarettes    Last attempt to quit: 05/10/2012    Years since  quitting: 5.6  . Smokeless tobacco: Never Used  Substance and Sexual Activity  . Alcohol use: No  . Drug use: No    Comment: marijuana; quit in early 1980's  . Sexual activity: Yes

## 2018-01-05 DIAGNOSIS — E876 Hypokalemia: Secondary | ICD-10-CM | POA: Diagnosis not present

## 2018-01-05 DIAGNOSIS — E1122 Type 2 diabetes mellitus with diabetic chronic kidney disease: Secondary | ICD-10-CM | POA: Diagnosis not present

## 2018-01-05 DIAGNOSIS — N2581 Secondary hyperparathyroidism of renal origin: Secondary | ICD-10-CM | POA: Diagnosis not present

## 2018-01-05 DIAGNOSIS — N186 End stage renal disease: Secondary | ICD-10-CM | POA: Diagnosis not present

## 2018-01-05 DIAGNOSIS — D509 Iron deficiency anemia, unspecified: Secondary | ICD-10-CM | POA: Diagnosis not present

## 2018-01-05 DIAGNOSIS — D631 Anemia in chronic kidney disease: Secondary | ICD-10-CM | POA: Diagnosis not present

## 2018-01-07 ENCOUNTER — Ambulatory Visit (INDEPENDENT_AMBULATORY_CARE_PROVIDER_SITE_OTHER): Payer: Medicare Other

## 2018-01-07 ENCOUNTER — Encounter (INDEPENDENT_AMBULATORY_CARE_PROVIDER_SITE_OTHER): Payer: Self-pay

## 2018-01-07 VITALS — Ht 65.0 in | Wt 145.0 lb

## 2018-01-07 DIAGNOSIS — D509 Iron deficiency anemia, unspecified: Secondary | ICD-10-CM | POA: Diagnosis not present

## 2018-01-07 DIAGNOSIS — D631 Anemia in chronic kidney disease: Secondary | ICD-10-CM | POA: Diagnosis not present

## 2018-01-07 DIAGNOSIS — E876 Hypokalemia: Secondary | ICD-10-CM | POA: Diagnosis not present

## 2018-01-07 DIAGNOSIS — E1122 Type 2 diabetes mellitus with diabetic chronic kidney disease: Secondary | ICD-10-CM | POA: Diagnosis not present

## 2018-01-07 DIAGNOSIS — N186 End stage renal disease: Secondary | ICD-10-CM | POA: Diagnosis not present

## 2018-01-07 DIAGNOSIS — N2581 Secondary hyperparathyroidism of renal origin: Secondary | ICD-10-CM | POA: Diagnosis not present

## 2018-01-07 DIAGNOSIS — Z89431 Acquired absence of right foot: Secondary | ICD-10-CM

## 2018-01-07 NOTE — Progress Notes (Signed)
Patient is s/p a right foot transmet amputation on 12/31/17. She is weight bearing in a post op shoe and walker. The pt is here for a dressing change. 4X4 applied to the incision and covered with an ace bandage. Advised the pt tht she can wash the area with dial sopa and water and apply dry dressing change daily. The pt's husband states that he can not and will not help her with this dressing and that if there is any drainage or complications that he will just "take her to the emergency room"   Mariana Arn, [facility]

## 2018-01-10 DIAGNOSIS — D631 Anemia in chronic kidney disease: Secondary | ICD-10-CM | POA: Diagnosis not present

## 2018-01-10 DIAGNOSIS — D509 Iron deficiency anemia, unspecified: Secondary | ICD-10-CM | POA: Diagnosis not present

## 2018-01-10 DIAGNOSIS — E1122 Type 2 diabetes mellitus with diabetic chronic kidney disease: Secondary | ICD-10-CM | POA: Diagnosis not present

## 2018-01-10 DIAGNOSIS — E876 Hypokalemia: Secondary | ICD-10-CM | POA: Diagnosis not present

## 2018-01-10 DIAGNOSIS — N2581 Secondary hyperparathyroidism of renal origin: Secondary | ICD-10-CM | POA: Diagnosis not present

## 2018-01-10 DIAGNOSIS — N186 End stage renal disease: Secondary | ICD-10-CM | POA: Diagnosis not present

## 2018-01-11 ENCOUNTER — Other Ambulatory Visit (INDEPENDENT_AMBULATORY_CARE_PROVIDER_SITE_OTHER): Payer: Self-pay | Admitting: Orthopedic Surgery

## 2018-01-12 DIAGNOSIS — D631 Anemia in chronic kidney disease: Secondary | ICD-10-CM | POA: Diagnosis not present

## 2018-01-12 DIAGNOSIS — N186 End stage renal disease: Secondary | ICD-10-CM | POA: Diagnosis not present

## 2018-01-12 DIAGNOSIS — D509 Iron deficiency anemia, unspecified: Secondary | ICD-10-CM | POA: Diagnosis not present

## 2018-01-12 DIAGNOSIS — E876 Hypokalemia: Secondary | ICD-10-CM | POA: Diagnosis not present

## 2018-01-12 DIAGNOSIS — E1122 Type 2 diabetes mellitus with diabetic chronic kidney disease: Secondary | ICD-10-CM | POA: Diagnosis not present

## 2018-01-12 DIAGNOSIS — N2581 Secondary hyperparathyroidism of renal origin: Secondary | ICD-10-CM | POA: Diagnosis not present

## 2018-01-13 ENCOUNTER — Encounter (INDEPENDENT_AMBULATORY_CARE_PROVIDER_SITE_OTHER): Payer: Self-pay | Admitting: Orthopedic Surgery

## 2018-01-13 ENCOUNTER — Ambulatory Visit (INDEPENDENT_AMBULATORY_CARE_PROVIDER_SITE_OTHER): Payer: Medicare Other | Admitting: Orthopedic Surgery

## 2018-01-13 ENCOUNTER — Telehealth (INDEPENDENT_AMBULATORY_CARE_PROVIDER_SITE_OTHER): Payer: Self-pay | Admitting: Orthopedic Surgery

## 2018-01-13 VITALS — Ht 65.0 in | Wt 145.0 lb

## 2018-01-13 DIAGNOSIS — Z4781 Encounter for orthopedic aftercare following surgical amputation: Secondary | ICD-10-CM | POA: Diagnosis not present

## 2018-01-13 DIAGNOSIS — Z89431 Acquired absence of right foot: Secondary | ICD-10-CM

## 2018-01-13 DIAGNOSIS — N186 End stage renal disease: Secondary | ICD-10-CM | POA: Diagnosis not present

## 2018-01-13 DIAGNOSIS — Z89421 Acquired absence of other right toe(s): Secondary | ICD-10-CM | POA: Diagnosis not present

## 2018-01-13 DIAGNOSIS — E1122 Type 2 diabetes mellitus with diabetic chronic kidney disease: Secondary | ICD-10-CM | POA: Diagnosis not present

## 2018-01-13 DIAGNOSIS — Z794 Long term (current) use of insulin: Secondary | ICD-10-CM | POA: Diagnosis not present

## 2018-01-13 DIAGNOSIS — E1151 Type 2 diabetes mellitus with diabetic peripheral angiopathy without gangrene: Secondary | ICD-10-CM | POA: Diagnosis not present

## 2018-01-13 NOTE — Telephone Encounter (Signed)
Called and lm on vm to advise verbal ok for PT. To call with questions.

## 2018-01-13 NOTE — Progress Notes (Signed)
Office Visit Note   Patient: Destiny Day           Date of Birth: 02/17/1951           MRN: [id number] Visit Date: 01/13/2018              Requested by: Velna Hatchet, MD [address] [place], [place] [address] PCP: Velna Hatchet, MD  Chief Complaint  Patient presents with  . Right Foot - Routine Post Op    12/31/17 right transment amputation       HPI: Patient presents in follow-up 2 weeks status post right foot transmetatarsal amputation.  Assessment & Plan: Visit Diagnoses:  1. S/P transmetatarsal amputation of foot, right ([place])     Plan: The incision is healing nicely she does have venous stasis swelling.  Patient was able to demonstrate applying her medical compression stocking for both lower extremities.  She states this feels comfortable she is to wear the sock around the clock wash in the washing machine and wash her feet with soap and water.  Follow-up in 2 weeks to remove the sutures in the right foot.  Patient was given another prescription for [employer] for extra-depth shoe orthotic spacer and double upright brace on the right she will need a carbon plate  Follow-Up Instructions: Return in about 2 weeks (around 01/27/2018).   Ortho Exam  Patient is alert, oriented, no adenopathy, well-dressed, normal affect, normal respiratory effort. Examination patient has venous stasis swelling in the right foot the surgical incision is well approximated no wound breakdown or dehiscence.  Imaging: No results found. No images are attached to the encounter.  Labs: Lab Results  Component Value Date   HGBA1C 6.4 (H) 12/31/2017   HGBA1C 5.0 09/03/2017   HGBA1C 7.0 (H) 08/11/2017   REPTSTATUS 08/25/2017 FINAL 08/24/2017   CULT NO GROWTH 08/24/2017   LABORGA STAPHYLOCOCCUS AUREUS 11/29/2008     Lab Results  Component Value Date   ALBUMIN 2.5 (L) 09/13/2017   ALBUMIN 2.5 (L) 09/10/2017   ALBUMIN 2.5 (L) 09/08/2017    Body mass index is 24.13 kg/m.  Orders:    No orders of the defined types were placed in this encounter.  No orders of the defined types were placed in this encounter.    Procedures: No procedures performed  Clinical Data: No additional findings.  ROS:  All other systems negative, except as noted in the HPI. Review of Systems  Objective: Vital Signs: Ht 5\' 5"  (1.651 m)   Wt 145 lb (65.8 kg)   BMI 24.13 kg/m   Specialty Comments:  No specialty comments available.  PMFS History: Patient Active Problem List   Diagnosis Date Noted  . Subacute osteomyelitis, right ankle and foot ([place])   . Gangrene of toe of right foot ([place]) 12/23/2017  . Ulcer of toe of right foot, limited to breakdown of skin ([place]) 11/09/2017  . Achilles tendon contracture, right 11/09/2017  . Labile blood glucose   . Anemia of infection and chronic disease   . Essential hypertension   . Black stool   . Right sided weakness   . Subdural hemorrhage following injury ([place]) 08/30/2017  . Diabetic peripheral neuropathy ([place])   . Chronic obstructive pulmonary disease ([place])   . Seizure prophylaxis   . Subdural hematoma ([place]) 08/24/2017  . Traumatic subdural hematoma ([place]) 08/23/2017  . Fall   . SDH (subdural hematoma) ([place])   . Acute respiratory failure with hypoxia ([place])   . Diabetes mellitus type 2  in nonobese ([place])   . Leukocytosis   . Labile blood pressure   . Generalized anxiety disorder   . Debility 08/16/2017  . Amputated great toe of right foot ([place])   . Amputated toe of left foot ([place])   . Post-operative pain   . ESRD on dialysis ([place])   . Diabetes mellitus ([place])   . Anxiety state   . Benign essential HTN   . Acute blood loss anemia   . Anemia of chronic disease   . Idiopathic chronic venous hypertension of both lower extremities with inflammation 10/13/2016  . S/P transmetatarsal amputation of foot, left ([place]) 09/21/2016  . Onychomycosis 08/15/2016  . Gangrene of toe of left foot ([place])   . PAD (peripheral artery disease) ([place])  07/08/2016  . Atherosclerosis of native artery of left lower extremity with gangrene ([place]) 06/23/2016  . GERD (gastroesophageal reflux disease) 10/07/2015  . ARF (acute renal failure) ([place])   . Hypothermia 06/12/2015  . Acute on chronic renal failure ([place]) 06/12/2015  . CHF (congestive heart failure) ([place]) 07/30/2014  . CKD (chronic kidney disease) stage 4, GFR 15-29 ml/min (HCC) 06/27/2014  . Hypertensive heart disease 05/25/2013  . CKD (chronic kidney disease) stage 3, GFR 30-59 ml/min (HCC) 05/25/2013  . Pulmonary edema 05/23/2013  . Type 2 diabetes mellitus with diabetic nephropathy ([place]) 05/23/2013  . Type 2 diabetes mellitus with hyperosmolar nonketotic hyperglycemia ([place]) 04/13/2013  . Chronic diastolic CHF (congestive heart failure) ([place]) 04/13/2013  . UGI bleed 03/31/2013  . Hypokalemia 08/24/2012  . Type II or unspecified type diabetes mellitus without mention of complication, not stated as uncontrolled 12/27/2007  . HLD (hyperlipidemia) 12/27/2007  . ALLERGIC RHINITIS 12/27/2007  . Asthma 12/27/2007   Past Medical History:  Diagnosis Date  . Abdominal bruit   . Anemia   . Anxiety   . Arthritis    Osteoarthritis  . Asthma   . Cervical disc disease    "pinced nerve"  . CHF (congestive heart failure) ([place])   . Complication of anesthesia    " after I got home from my last procedure, I started itching."  . Diabetes mellitus    Type II  . Diverticulitis   . ESRD on peritoneal dialysis [facility])    Hemodialysis - MWF- [place]   . GERD (gastroesophageal reflux disease)    from medications  . GI bleed 03/31/2013  . History of hiatal hernia   . Hyperlipidemia   . Hypertension   . Neuropathy    left leg  . Osteoporosis   . Peripheral vascular disease ([place])   . Pneumonia    "very young"  . Seasonal allergies   . Shortness of breath dyspnea    WIth exertion  . Sleep apnea    can't afford cpap    Family History  Problem Relation Age of Onset  . Other Mother         not sure of cause of death  . Diabetes Father   . Pancreatic cancer Maternal Grandmother   . Colon cancer Neg Hx     Past Surgical History:  Procedure Laterality Date  . A/V SHUNTOGRAM N/A 09/22/2016   Procedure: A/V Shuntogram - left arm;  Surgeon: Serafina Mitchell, MD;  Location: [facility];  Service: Cardiovascular;  Laterality: N/A;  . ABDOMINAL HYSTERECTOMY  1993`  . AMPUTATION Left 09/01/2016   Procedure: LEFT FOOT TRANSMETATARSAL AMPUTATION;  Surgeon: Newt Minion, MD;  Location: [facility];  Service: Orthopedics;  Laterality: Left;  .  AMPUTATION Right 08/11/2017   Procedure: RIGHT GREAT TOE AMPUTATION DIGIT;  Surgeon: Rosetta Posner, MD;  Location: [facility];  Service: Vascular;  Laterality: Right;  . AMPUTATION Right 12/31/2017   Procedure: RIGHT TRANSMETATARSAL AMPUTATION;  Surgeon: Newt Minion, MD;  Location: [facility];  Service: Orthopedics;  Laterality: Right;  . AV FISTULA PLACEMENT Left 04/21/2016   Procedure: INSERTION OF ARTERIOVENOUS (AV) GORE-TEX GRAFT ARM LEFT;  Surgeon: Elam [place], MD;  Location: [facility];  Service: Vascular;  Laterality: Left;  . [place] TRANSPOSITION Left 07/10/2014   Procedure: BASCILIC VEIN TRANSPOSITION;  Surgeon: Angelia Mould, MD;  Location: [facility];  Service: Vascular;  Laterality: Left;  . BASCILIC VEIN TRANSPOSITION Right 11/08/2014   Procedure: FIRST STAGE BASILIC VEIN TRANSPOSITION;  Surgeon: Angelia Mould, MD;  Location: [facility];  Service: Vascular;  Laterality: Right;  . BASCILIC VEIN TRANSPOSITION Right 01/18/2015   Procedure: SECOND STAGE BASILIC VEIN TRANSPOSITION;  Surgeon: Angelia Mould, MD;  Location: [facility];  Service: Vascular;  Laterality: Right;  . CRANIOTOMY N/A 08/23/2017   Procedure: CRANIOTOMY HEMATOMA EVACUATION SUBDURAL;  Surgeon: Ashok Pall, MD;  Location: [facility];  Service: Neurosurgery;  Laterality: N/A;  . CRANIOTOMY Left 08/24/2017   Procedure: CRANIOTOMY FOR RECURRENT ACUTE SUBDURAL HEMATOMA;  Surgeon:  Ashok Pall, MD;  Location: [facility];  Service: Neurosurgery;  Laterality: Left;  . ESOPHAGOGASTRODUODENOSCOPY N/A 03/31/2013   Procedure: ESOPHAGOGASTRODUODENOSCOPY (EGD);  Surgeon: Gatha Mayer, MD;  Location: [facility];  Service: Endoscopy;  Laterality: N/A;  . EYE SURGERY     laser surgery  . FEMORAL-POPLITEAL BYPASS GRAFT Left 07/08/2016   Procedure: LEFT  FEMORAL-BELOW KNEE POPLITEAL ARTERY BYPASS GRAFT USING 6MM X 80 CM PROPATEN GORETEX GRAFT WITH RINGS.;  Surgeon: Rosetta Posner, MD;  Location: [facility];  Service: Vascular;  Laterality: Left;  . FEMORAL-POPLITEAL BYPASS GRAFT Right 08/11/2017   Procedure: RIGHT FEMORAL TO BELOW KNEE POPLITEAL ARTERKY  BYPASS GRAFT USING 6MM RINGED PROPATEN GRAFT;  Surgeon: Rosetta Posner, MD;  Location: [facility];  Service: Vascular;  Laterality: Right;  . FISTULOGRAM Left 10/29/2014   Procedure: FISTULOGRAM;  Surgeon: Angelia Mould, MD;  Location: [facility];  Service: Cardiovascular;  Laterality: Left;  . LIGATION OF ARTERIOVENOUS  FISTULA Left 04/21/2016   Procedure: LIGATION OF ARTERIOVENOUS  FISTULA LEFT ARM;  Surgeon: Elam [place], MD;  Location: [facility];  Service: Vascular;  Laterality: Left;  . LOWER EXTREMITY ANGIOGRAPHY N/A 04/06/2017   Procedure: Lower Extremity Angiography - Right;  Surgeon: Serafina Mitchell, MD;  Location: [facility];  Service: Cardiovascular;  Laterality: N/A;  . PATCH ANGIOPLASTY Right 01/18/2015   Procedure: BASILIC VEIN PATCH ANGIOPLASTY USING VASCUGUARD PATCH;  Surgeon: Angelia Mould, MD;  Location: [facility];  Service: Vascular;  Laterality: Right;  . PERIPHERAL VASCULAR BALLOON ANGIOPLASTY  09/22/2016   Procedure: Peripheral Vascular Balloon Angioplasty;  Surgeon: Serafina Mitchell, MD;  Location: [facility];  Service: Cardiovascular;;  Lt. Fistula  . PERIPHERAL VASCULAR CATHETERIZATION N/A 06/23/2016   Procedure: Abdominal Aortogram w/Lower Extremity;  Surgeon: Serafina Mitchell, MD;  Location: [facility]  [facility];  Service: Cardiovascular;  Laterality: N/A;  . PERIPHERAL VASCULAR CATHETERIZATION  06/23/2016   Procedure: Peripheral Vascular Intervention;  Surgeon: Serafina Mitchell, MD;  Location: [facility];  Service: Cardiovascular;;  lt common and external illiac artery   Social History   Occupational History  . Occupation: Retired  Tobacco Use  .  Smoking status: Former Smoker    Packs/day: 0.35    Years: 40.00    Pack years: 14.00    Types: Cigarettes    Last attempt to quit: 05/10/2012    Years since quitting: 5.6  . Smokeless tobacco: Never Used  Substance and Sexual Activity  . Alcohol use: No  . Drug use: No    Comment: marijuana; quit in early 1980's  . Sexual activity: Yes

## 2018-01-13 NOTE — Telephone Encounter (Signed)
Almira from [facility] called asking for a verbal okay for home health PT starting next week 2 week 4. CB # [phone]

## 2018-01-14 DIAGNOSIS — E876 Hypokalemia: Secondary | ICD-10-CM | POA: Diagnosis not present

## 2018-01-14 DIAGNOSIS — D631 Anemia in chronic kidney disease: Secondary | ICD-10-CM | POA: Diagnosis not present

## 2018-01-14 DIAGNOSIS — D509 Iron deficiency anemia, unspecified: Secondary | ICD-10-CM | POA: Diagnosis not present

## 2018-01-14 DIAGNOSIS — E1122 Type 2 diabetes mellitus with diabetic chronic kidney disease: Secondary | ICD-10-CM | POA: Diagnosis not present

## 2018-01-14 DIAGNOSIS — N186 End stage renal disease: Secondary | ICD-10-CM | POA: Diagnosis not present

## 2018-01-14 DIAGNOSIS — N2581 Secondary hyperparathyroidism of renal origin: Secondary | ICD-10-CM | POA: Diagnosis not present

## 2018-01-17 DIAGNOSIS — E1122 Type 2 diabetes mellitus with diabetic chronic kidney disease: Secondary | ICD-10-CM | POA: Diagnosis not present

## 2018-01-17 DIAGNOSIS — D631 Anemia in chronic kidney disease: Secondary | ICD-10-CM | POA: Diagnosis not present

## 2018-01-17 DIAGNOSIS — N2581 Secondary hyperparathyroidism of renal origin: Secondary | ICD-10-CM | POA: Diagnosis not present

## 2018-01-17 DIAGNOSIS — N186 End stage renal disease: Secondary | ICD-10-CM | POA: Diagnosis not present

## 2018-01-17 DIAGNOSIS — E876 Hypokalemia: Secondary | ICD-10-CM | POA: Diagnosis not present

## 2018-01-17 DIAGNOSIS — D509 Iron deficiency anemia, unspecified: Secondary | ICD-10-CM | POA: Diagnosis not present

## 2018-01-18 ENCOUNTER — Encounter: Payer: Self-pay | Admitting: Vascular Surgery

## 2018-01-18 ENCOUNTER — Ambulatory Visit (HOSPITAL_COMMUNITY)
Admission: RE | Admit: 2018-01-18 | Discharge: 2018-01-18 | Disposition: A | Discharge status: Home / Self Care | Payer: Medicare Other | Source: Ambulatory Visit | Attending: Vascular Surgery | Admitting: Vascular Surgery

## 2018-01-18 ENCOUNTER — Ambulatory Visit (INDEPENDENT_AMBULATORY_CARE_PROVIDER_SITE_OTHER)
Admission: RE | Admit: 2018-01-18 | Discharge: 2018-01-18 | Disposition: A | Discharge status: Home / Self Care | Payer: Medicare Other | Source: Ambulatory Visit | Attending: Vascular Surgery | Admitting: Vascular Surgery

## 2018-01-18 ENCOUNTER — Other Ambulatory Visit: Payer: Self-pay | Admitting: Vascular Surgery

## 2018-01-18 ENCOUNTER — Ambulatory Visit (INDEPENDENT_AMBULATORY_CARE_PROVIDER_SITE_OTHER): Payer: Medicare Other | Admitting: Vascular Surgery

## 2018-01-18 VITALS — BP 191/75 | HR 85 | Temp 98.0°F | Ht 65.0 in | Wt 141.0 lb

## 2018-01-18 DIAGNOSIS — N186 End stage renal disease: Secondary | ICD-10-CM

## 2018-01-18 DIAGNOSIS — Z89421 Acquired absence of other right toe(s): Secondary | ICD-10-CM | POA: Diagnosis not present

## 2018-01-18 DIAGNOSIS — Z794 Long term (current) use of insulin: Secondary | ICD-10-CM | POA: Diagnosis not present

## 2018-01-18 DIAGNOSIS — I70709 Unspecified atherosclerosis of other type of bypass graft(s) of the extremities, unspecified extremity: Secondary | ICD-10-CM | POA: Diagnosis not present

## 2018-01-18 DIAGNOSIS — I739 Peripheral vascular disease, unspecified: Secondary | ICD-10-CM

## 2018-01-18 DIAGNOSIS — I96 Gangrene, not elsewhere classified: Secondary | ICD-10-CM

## 2018-01-18 DIAGNOSIS — E1122 Type 2 diabetes mellitus with diabetic chronic kidney disease: Secondary | ICD-10-CM | POA: Diagnosis not present

## 2018-01-18 DIAGNOSIS — Z4781 Encounter for orthopedic aftercare following surgical amputation: Secondary | ICD-10-CM | POA: Diagnosis not present

## 2018-01-18 DIAGNOSIS — I7025 Atherosclerosis of native arteries of other extremities with ulceration: Secondary | ICD-10-CM

## 2018-01-18 DIAGNOSIS — Z992 Dependence on renal dialysis: Secondary | ICD-10-CM | POA: Diagnosis not present

## 2018-01-18 DIAGNOSIS — E1151 Type 2 diabetes mellitus with diabetic peripheral angiopathy without gangrene: Secondary | ICD-10-CM | POA: Diagnosis not present

## 2018-01-18 NOTE — Progress Notes (Signed)
Established Previous Bypass   History of Present Illness   Destiny Day is a 67 y.o. (12-06-1950) female who presents for routine graft surveillance   Prior procedures include: 1.  L femoral to BK pop bypass 06/2016 2. R femoral to below knee bypass with PTFE 07/2017 3. R TMA by Dr. Sharol Given 12/31/17   Patient sustained a fall in as a result a large subdural hematoma during hospitalization.  She required to craniotomy procedures by Dr. Christella Noa.  She continues to work on her balance and is able to ambulate with a cane.  She denies any rest pain or further tissue ischemia.  She will follow-up with Dr. due to for wound check and possible suture removal of right TMA in about 2 weeks.  Patient states she has restarted her Plavix since she was last seen in office.    Past medical history also significant for end-stage renal disease on hemodialysis via left upper arm loop graft.  She is dialyzing on a Monday Wednesday Friday schedule with no complications from graft.  She denies steal syndromes left arm.  The patient's PMH, PSH, SH, and FamHx were reviewed and are unchanged from prior visit  Current Outpatient Medications  Medication Sig Dispense Refill  . albuterol (PROVENTIL HFA;VENTOLIN HFA) 108 (90 BASE) MCG/ACT inhaler Inhale 1-2 puffs into the lungs every 6 (six) hours as needed for wheezing or shortness of breath.    . Amino Acids-Protein Hydrolys (FEEDING SUPPLEMENT, PRO-STAT SUGAR FREE 64,) LIQD Take 30 mLs by mouth 2 (two) times daily. 900 mL 0  . aspirin EC 81 MG tablet Take 81 mg by mouth daily.    Marland Kitchen atorvastatin (LIPITOR) 20 MG tablet Take 20 mg by mouth at bedtime.     . budesonide-formoterol (SYMBICORT) 160-4.5 MCG/ACT inhaler Inhale 1 puff into the lungs daily as needed (for respiratory issues).     . carvedilol (COREG) 12.5 MG tablet Take 12.5 mg by mouth 2 (two) times daily with a meal.    . cloNIDine (CATAPRES) 0.2 MG tablet Take 1 tablet (0.2 mg total) by mouth 3 (three)  times daily. 90 tablet 0  . clopidogrel (PLAVIX) 75 MG tablet Take 75 mg by mouth daily.    . Dakins (SODIUM HYPOCHLORITE) external solution Apply topically daily. Cleanse groin and cover with damp gauze soaked in dakins. 473 mL 0  . diazepam (VALIUM) 5 MG tablet Take 5 mg by mouth 2 (two) times daily as needed for anxiety.    Marland Kitchen doxycycline (VIBRA-TABS) 100 MG tablet Take 1 tablet (100 mg total) by mouth 2 (two) times daily. 60 tablet 0  . fluticasone (FLONASE) 50 MCG/ACT nasal spray Place 1 spray into both nostrils daily as needed for allergies or rhinitis.    Marland Kitchen gabapentin (NEURONTIN) 100 MG capsule TAKE 1 CAPSULE (100 MG TOTAL) BY MOUTH AT BEDTIME. WHEN NECESSARY FOR NEUROPATHY PAIN 30 capsule 3  . hydrALAZINE (APRESOLINE) 50 MG tablet Take 1 tab 2 times a day on non dialysis days. (Patient taking differently: Take 50 mg by mouth 2 (two) times daily. ) 60 tablet 0  . hydrocerin (EUCERIN) CREA Apply 297 application topically 2 (two) times daily. 228 g 0  . insulin NPH Human (HUMULIN N,NOVOLIN N) 100 UNIT/ML injection Inject 10 Units into the skin at bedtime.    . insulin regular (NOVOLIN R,HUMULIN R) 100 units/mL injection Inject 3-8 Units into the skin 3 (three) times daily before meals. Per sliding scale    . LEVEMIR FLEXTOUCH 100  UNIT/ML Pen Inject 7 Units into the skin at bedtime. 15 mL 11  . loratadine (CLARITIN) 10 MG tablet Take 10 mg by mouth daily as needed for allergies.    . multivitamin (RENA-VIT) TABS tablet Take 1 tablet by mouth daily.   5  . omeprazole (PRILOSEC) 20 MG capsule Take 20 mg by mouth daily.     Marland Kitchen oxyCODONE-acetaminophen (PERCOCET/ROXICET) 5-325 MG tablet Take 1 tablet by mouth every 4 (four) hours as needed for severe pain. 30 tablet 0  . polyethylene glycol (MIRALAX / GLYCOLAX) packet Take 17 g by mouth daily. (Patient taking differently: Take 17 g by mouth daily as needed for moderate constipation. ) 30 each 0   No current facility-administered medications for this  visit.     On ROS today: 10 system ROS is negative unless otherwise noted in HPI   Physical Examination   Vitals:   01/18/18 1454  BP: (!) 191/75  Pulse: 85  Temp: 98 F (36.7 C)  TempSrc: Oral  SpO2: 100%  Weight: 141 lb (64 kg)  Height: 5\' 5"  (1.651 m)   Body mass index is 23.46 kg/m.  General Alert, O x 3, WD, NAD  Pulmonary Sym exp, good B air movt,   Cardiac RRR, Nl S1, S2,   Vascular Vessel Right Left  Radial Palpable Palpable  Brachial Palpable Palpable  Carotid Palpable, No Bruit Palpable, No Bruit  Aorta Not palpable N/A  Femoral Not examined Not examined  Popliteal Not palpable Not palpable  PT Not palpable Not palpable  DP Palpable Palpable    Gastro- intestinal soft, non-distended,  Musculo- skeletal M/S 5/5 throughout  , Extremities without ischemic changes  , No edema present, No visible varicosities , No Lipodermatosclerosis present; right TMA incision intact without drainage or areas of fluctuance; some skin edges along incision dusky in appearance; palpable thrill and audible bruit throughout left upper arm loop graft; palpable left radial pulse  Neurologic A&O    Non-Invasive Vascular Imaging ABI (01/18/18)  R:   ABI: 0.91   PT: mono  DP: mono  L:   ABI: 1.08   PT: bi  DP: bi   Bypass Duplex   R LE bypass patent; 346cm/s inflow stenosis  LLE bypass patent  180cm/s proximal anastomosis   Medical Decision Making   Destiny Day is a 67 y.o. female who presents for routine graft surveillance status post right femoral to below the knee popliteal bypass with PTFE 07/2017   Dr. Donnetta Hutching was involved in the evaluation and management plan of this patient  Patent bypasses and right and left lower extremities by duplex  Follow-up with Dr. Sharol Given in 2 weeks for evaluation of right TMA  Recheck bypass surveillance with ABIs in 6 months per protocol  Patient was hypertensive today; she was encouraged to follow-up with her PCP;  patient states she is currently searching for a new PCP; if at all possible we will help her establish care with a new PCP  Continue Plavix if okay with neurosurgery   Dagoberto Ligas PA-C [facility] Office: [phone]

## 2018-01-19 ENCOUNTER — Other Ambulatory Visit (INDEPENDENT_AMBULATORY_CARE_PROVIDER_SITE_OTHER): Payer: Self-pay | Admitting: Orthopedic Surgery

## 2018-01-19 DIAGNOSIS — E876 Hypokalemia: Secondary | ICD-10-CM | POA: Diagnosis not present

## 2018-01-19 DIAGNOSIS — N2581 Secondary hyperparathyroidism of renal origin: Secondary | ICD-10-CM | POA: Diagnosis not present

## 2018-01-19 DIAGNOSIS — D509 Iron deficiency anemia, unspecified: Secondary | ICD-10-CM | POA: Diagnosis not present

## 2018-01-19 DIAGNOSIS — N186 End stage renal disease: Secondary | ICD-10-CM | POA: Diagnosis not present

## 2018-01-19 DIAGNOSIS — D631 Anemia in chronic kidney disease: Secondary | ICD-10-CM | POA: Diagnosis not present

## 2018-01-19 DIAGNOSIS — E1122 Type 2 diabetes mellitus with diabetic chronic kidney disease: Secondary | ICD-10-CM | POA: Diagnosis not present

## 2018-01-19 NOTE — Telephone Encounter (Signed)
Pt is s/p a transmet amputaton do you wish to refill?

## 2018-01-20 DIAGNOSIS — Z89411 Acquired absence of right great toe: Secondary | ICD-10-CM | POA: Diagnosis not present

## 2018-01-20 DIAGNOSIS — Z89432 Acquired absence of left foot: Secondary | ICD-10-CM | POA: Diagnosis not present

## 2018-01-20 DIAGNOSIS — E1151 Type 2 diabetes mellitus with diabetic peripheral angiopathy without gangrene: Secondary | ICD-10-CM | POA: Diagnosis not present

## 2018-01-20 DIAGNOSIS — I739 Peripheral vascular disease, unspecified: Secondary | ICD-10-CM | POA: Diagnosis not present

## 2018-01-21 ENCOUNTER — Emergency Department (HOSPITAL_COMMUNITY): Payer: Medicare Other

## 2018-01-21 ENCOUNTER — Observation Stay (HOSPITAL_COMMUNITY)
Admission: EM | Admit: 2018-01-21 | Discharge: 2018-01-23 | Disposition: A | Discharge status: Home / Self Care | Payer: Medicare Other | Attending: Internal Medicine | Admitting: Internal Medicine

## 2018-01-21 ENCOUNTER — Encounter (HOSPITAL_COMMUNITY): Payer: Self-pay | Admitting: Emergency Medicine

## 2018-01-21 DIAGNOSIS — E1151 Type 2 diabetes mellitus with diabetic peripheral angiopathy without gangrene: Secondary | ICD-10-CM | POA: Insufficient documentation

## 2018-01-21 DIAGNOSIS — Z992 Dependence on renal dialysis: Secondary | ICD-10-CM | POA: Diagnosis not present

## 2018-01-21 DIAGNOSIS — Z8679 Personal history of other diseases of the circulatory system: Secondary | ICD-10-CM | POA: Diagnosis not present

## 2018-01-21 DIAGNOSIS — Z7982 Long term (current) use of aspirin: Secondary | ICD-10-CM | POA: Insufficient documentation

## 2018-01-21 DIAGNOSIS — I132 Hypertensive heart and chronic kidney disease with heart failure and with stage 5 chronic kidney disease, or end stage renal disease: Secondary | ICD-10-CM | POA: Insufficient documentation

## 2018-01-21 DIAGNOSIS — D631 Anemia in chronic kidney disease: Secondary | ICD-10-CM | POA: Diagnosis present

## 2018-01-21 DIAGNOSIS — R0602 Shortness of breath: Secondary | ICD-10-CM | POA: Diagnosis not present

## 2018-01-21 DIAGNOSIS — Z89432 Acquired absence of left foot: Secondary | ICD-10-CM | POA: Diagnosis not present

## 2018-01-21 DIAGNOSIS — R000 Tachycardia, unspecified: Secondary | ICD-10-CM | POA: Diagnosis not present

## 2018-01-21 DIAGNOSIS — E1142 Type 2 diabetes mellitus with diabetic polyneuropathy: Secondary | ICD-10-CM | POA: Diagnosis not present

## 2018-01-21 DIAGNOSIS — D638 Anemia in other chronic diseases classified elsewhere: Secondary | ICD-10-CM | POA: Diagnosis not present

## 2018-01-21 DIAGNOSIS — F411 Generalized anxiety disorder: Secondary | ICD-10-CM | POA: Diagnosis present

## 2018-01-21 DIAGNOSIS — Z7951 Long term (current) use of inhaled steroids: Secondary | ICD-10-CM | POA: Insufficient documentation

## 2018-01-21 DIAGNOSIS — E1121 Type 2 diabetes mellitus with diabetic nephropathy: Secondary | ICD-10-CM | POA: Insufficient documentation

## 2018-01-21 DIAGNOSIS — Z95820 Peripheral vascular angioplasty status with implants and grafts: Secondary | ICD-10-CM | POA: Insufficient documentation

## 2018-01-21 DIAGNOSIS — E785 Hyperlipidemia, unspecified: Secondary | ICD-10-CM | POA: Diagnosis not present

## 2018-01-21 DIAGNOSIS — J449 Chronic obstructive pulmonary disease, unspecified: Secondary | ICD-10-CM | POA: Diagnosis present

## 2018-01-21 DIAGNOSIS — E11621 Type 2 diabetes mellitus with foot ulcer: Secondary | ICD-10-CM | POA: Diagnosis not present

## 2018-01-21 DIAGNOSIS — J9601 Acute respiratory failure with hypoxia: Secondary | ICD-10-CM | POA: Diagnosis not present

## 2018-01-21 DIAGNOSIS — R05 Cough: Secondary | ICD-10-CM | POA: Diagnosis not present

## 2018-01-21 DIAGNOSIS — I10 Essential (primary) hypertension: Secondary | ICD-10-CM | POA: Diagnosis not present

## 2018-01-21 DIAGNOSIS — N186 End stage renal disease: Secondary | ICD-10-CM

## 2018-01-21 DIAGNOSIS — I5032 Chronic diastolic (congestive) heart failure: Secondary | ICD-10-CM | POA: Insufficient documentation

## 2018-01-21 DIAGNOSIS — G4730 Sleep apnea, unspecified: Secondary | ICD-10-CM | POA: Insufficient documentation

## 2018-01-21 DIAGNOSIS — K219 Gastro-esophageal reflux disease without esophagitis: Secondary | ICD-10-CM | POA: Diagnosis not present

## 2018-01-21 DIAGNOSIS — E113521 Type 2 diabetes mellitus with proliferative diabetic retinopathy with traction retinal detachment involving the macula, right eye: Secondary | ICD-10-CM

## 2018-01-21 DIAGNOSIS — J45909 Unspecified asthma, uncomplicated: Secondary | ICD-10-CM | POA: Diagnosis present

## 2018-01-21 DIAGNOSIS — I160 Hypertensive urgency: Secondary | ICD-10-CM | POA: Diagnosis not present

## 2018-01-21 DIAGNOSIS — Z79899 Other long term (current) drug therapy: Secondary | ICD-10-CM | POA: Insufficient documentation

## 2018-01-21 DIAGNOSIS — I5033 Acute on chronic diastolic (congestive) heart failure: Secondary | ICD-10-CM | POA: Diagnosis not present

## 2018-01-21 DIAGNOSIS — E876 Hypokalemia: Secondary | ICD-10-CM | POA: Insufficient documentation

## 2018-01-21 DIAGNOSIS — J9690 Respiratory failure, unspecified, unspecified whether with hypoxia or hypercapnia: Secondary | ICD-10-CM | POA: Diagnosis present

## 2018-01-21 DIAGNOSIS — E119 Type 2 diabetes mellitus without complications: Secondary | ICD-10-CM

## 2018-01-21 DIAGNOSIS — Z794 Long term (current) use of insulin: Secondary | ICD-10-CM | POA: Insufficient documentation

## 2018-01-21 DIAGNOSIS — I509 Heart failure, unspecified: Secondary | ICD-10-CM

## 2018-01-21 DIAGNOSIS — N2581 Secondary hyperparathyroidism of renal origin: Secondary | ICD-10-CM | POA: Insufficient documentation

## 2018-01-21 DIAGNOSIS — I272 Pulmonary hypertension, unspecified: Secondary | ICD-10-CM | POA: Insufficient documentation

## 2018-01-21 DIAGNOSIS — I159 Secondary hypertension, unspecified: Secondary | ICD-10-CM | POA: Diagnosis present

## 2018-01-21 DIAGNOSIS — Z89411 Acquired absence of right great toe: Secondary | ICD-10-CM | POA: Insufficient documentation

## 2018-01-21 DIAGNOSIS — Z87891 Personal history of nicotine dependence: Secondary | ICD-10-CM | POA: Insufficient documentation

## 2018-01-21 LAB — BASIC METABOLIC PANEL
Anion gap: 12 (ref 5–15)
BUN: 10 mg/dL (ref 8–23)
CO2: 30 mmol/L (ref 22–32)
Calcium: 9.3 mg/dL (ref 8.9–10.3)
Chloride: 96 mmol/L — ABNORMAL LOW (ref 98–111)
Creatinine, Ser: 4.74 mg/dL — ABNORMAL HIGH (ref 0.44–1.00)
GFR calc Af Amer: 10 mL/min — ABNORMAL LOW (ref >60)
GFR calc non Af Amer: 9 mL/min — ABNORMAL LOW (ref >60)
Glucose, Bld: 149 mg/dL — ABNORMAL HIGH (ref 70–99)
Potassium: 3.1 mmol/L — ABNORMAL LOW (ref 3.5–5.1)
Sodium: 138 mmol/L (ref 135–145)

## 2018-01-21 LAB — I-STAT TROPONIN, ED: Troponin i, poc: 0.04 ng/mL (ref 0.00–0.08)

## 2018-01-21 LAB — CBC
HCT: 33.7 % — ABNORMAL LOW (ref 36.0–46.0)
Hemoglobin: 10 g/dL — ABNORMAL LOW (ref 12.0–15.0)
MCH: 29.2 pg (ref 26.0–34.0)
MCHC: 29.7 g/dL — ABNORMAL LOW (ref 30.0–36.0)
MCV: 98.5 fL (ref 78.0–100.0)
Platelets: 232 10*3/uL (ref 150–400)
RBC: 3.42 MIL/uL — ABNORMAL LOW (ref 3.87–5.11)
RDW: 15 % (ref 11.5–15.5)
WBC: 8.1 10*3/uL (ref 4.0–10.5)

## 2018-01-21 LAB — GLUCOSE, CAPILLARY: Glucose-Capillary: 150 mg/dL — ABNORMAL HIGH (ref 70–99)

## 2018-01-21 MED ORDER — PANTOPRAZOLE SODIUM 40 MG PO TBEC
40.0000 mg | DELAYED_RELEASE_TABLET | Freq: Every day | ORAL | Status: DC
  Administered 2018-01-22 – 2018-01-23 (×2): 40 mg via ORAL
  Filled 2018-01-21 (×2): qty 1

## 2018-01-21 MED ORDER — DAKINS 0.4-0.5 % EX SOLN: Freq: Every day | CUTANEOUS | Status: DC

## 2018-01-21 MED ORDER — CARVEDILOL 12.5 MG PO TABS
12.5000 mg | ORAL_TABLET | Freq: Two times a day (BID) | ORAL | Status: DC
  Administered 2018-01-22 – 2018-01-23 (×2): 12.5 mg via ORAL
  Filled 2018-01-21 (×2): qty 1

## 2018-01-21 MED ORDER — ATORVASTATIN CALCIUM 20 MG PO TABS
20.0000 mg | ORAL_TABLET | Freq: Every day | ORAL | Status: DC
  Administered 2018-01-22 (×2): 20 mg via ORAL
  Filled 2018-01-21 (×2): qty 1

## 2018-01-21 MED ORDER — PENTAFLUOROPROP-TETRAFLUOROETH EX AERO: 1.0000 "application " | INHALATION_SPRAY | CUTANEOUS | Status: DC | PRN

## 2018-01-21 MED ORDER — HYDRALAZINE HCL 50 MG PO TABS: 50.0000 mg | ORAL_TABLET | Freq: Two times a day (BID) | ORAL | Status: DC

## 2018-01-21 MED ORDER — HYDROCERIN EX CREA
228.0000 "application " | TOPICAL_CREAM | Freq: Two times a day (BID) | CUTANEOUS | Status: DC
  Administered 2018-01-22 (×2): 228 via TOPICAL
  Filled 2018-01-21: qty 142

## 2018-01-21 MED ORDER — SODIUM CHLORIDE 0.9 % IV SOLN: 100.0000 mL | INTRAVENOUS | Status: DC | PRN

## 2018-01-21 MED ORDER — CHLORHEXIDINE GLUCONATE CLOTH 2 % EX PADS
6.0000 | MEDICATED_PAD | Freq: Every day | CUTANEOUS | Status: DC
  Administered 2018-01-22: 6 via TOPICAL

## 2018-01-21 MED ORDER — DOXERCALCIFEROL 4 MCG/2ML IV SOLN: 4.0000 ug | INTRAVENOUS | Status: DC

## 2018-01-21 MED ORDER — DILTIAZEM LOAD VIA INFUSION
20.0000 mg | Freq: Once | INTRAVENOUS | Status: AC
  Administered 2018-01-21: 20 mg via INTRAVENOUS
  Filled 2018-01-21: qty 20

## 2018-01-21 MED ORDER — LORAZEPAM 2 MG/ML IJ SOLN
0.5000 mg | Freq: Once | INTRAMUSCULAR | Status: AC
  Administered 2018-01-21: 0.5 mg via INTRAVENOUS
  Filled 2018-01-21: qty 1

## 2018-01-21 MED ORDER — DILTIAZEM HCL-DEXTROSE 100-5 MG/100ML-% IV SOLN (PREMIX)
5.0000 mg/h | INTRAVENOUS | Status: DC
  Administered 2018-01-21: 5 mg/h via INTRAVENOUS
  Filled 2018-01-21: qty 100

## 2018-01-21 MED ORDER — PRO-STAT SUGAR FREE PO LIQD: 30.0000 mL | Freq: Two times a day (BID) | ORAL | Status: DC

## 2018-01-21 MED ORDER — LIDOCAINE HCL (PF) 1 % IJ SOLN: 5.0000 mL | INTRAMUSCULAR | Status: DC | PRN

## 2018-01-21 MED ORDER — HYDRALAZINE HCL 20 MG/ML IJ SOLN
10.0000 mg | INTRAMUSCULAR | Status: DC | PRN
  Administered 2018-01-22: 10 mg via INTRAVENOUS
  Filled 2018-01-21: qty 1

## 2018-01-21 MED ORDER — CLOPIDOGREL BISULFATE 75 MG PO TABS
75.0000 mg | ORAL_TABLET | Freq: Every day | ORAL | Status: DC
  Administered 2018-01-22 – 2018-01-23 (×2): 75 mg via ORAL
  Filled 2018-01-21 (×2): qty 1

## 2018-01-21 MED ORDER — INSULIN ASPART 100 UNIT/ML ~~LOC~~ SOLN
0.0000 [IU] | Freq: Three times a day (TID) | SUBCUTANEOUS | Status: DC
  Administered 2018-01-22: 1 [IU] via SUBCUTANEOUS
  Administered 2018-01-23: 5 [IU] via SUBCUTANEOUS

## 2018-01-21 MED ORDER — INSULIN NPH (HUMAN) (ISOPHANE) 100 UNIT/ML ~~LOC~~ SUSP
10.0000 [IU] | Freq: Every day | SUBCUTANEOUS | Status: DC
  Administered 2018-01-22 (×2): 10 [IU] via SUBCUTANEOUS
  Filled 2018-01-21: qty 10

## 2018-01-21 MED ORDER — CLONIDINE HCL 0.2 MG PO TABS
0.2000 mg | ORAL_TABLET | Freq: Three times a day (TID) | ORAL | Status: DC
  Administered 2018-01-22 – 2018-01-23 (×4): 0.2 mg via ORAL
  Filled 2018-01-21 (×4): qty 1

## 2018-01-21 MED ORDER — ACETAMINOPHEN 325 MG PO TABS: 650.0000 mg | ORAL_TABLET | Freq: Four times a day (QID) | ORAL | Status: DC | PRN

## 2018-01-21 MED ORDER — DIAZEPAM 5 MG PO TABS
5.0000 mg | ORAL_TABLET | Freq: Two times a day (BID) | ORAL | Status: DC | PRN
  Administered 2018-01-22 (×2): 5 mg via ORAL
  Filled 2018-01-21 (×2): qty 1

## 2018-01-21 MED ORDER — HYDRALAZINE HCL 20 MG/ML IJ SOLN
20.0000 mg | Freq: Once | INTRAMUSCULAR | Status: AC
  Administered 2018-01-21: 20 mg via INTRAVENOUS
  Filled 2018-01-21: qty 1

## 2018-01-21 MED ORDER — OXYCODONE-ACETAMINOPHEN 5-325 MG PO TABS: 1.0000 | ORAL_TABLET | ORAL | Status: DC | PRN

## 2018-01-21 MED ORDER — RENA-VITE PO TABS
1.0000 | ORAL_TABLET | Freq: Every day | ORAL | Status: DC
  Administered 2018-01-22 – 2018-01-23 (×2): 1 via ORAL
  Filled 2018-01-21 (×2): qty 1

## 2018-01-21 MED ORDER — LIDOCAINE-PRILOCAINE 2.5-2.5 % EX CREA: 1.0000 "application " | TOPICAL_CREAM | CUTANEOUS | Status: DC | PRN

## 2018-01-21 MED ORDER — LORATADINE 10 MG PO TABS: 10.0000 mg | ORAL_TABLET | Freq: Every day | ORAL | Status: DC | PRN

## 2018-01-21 MED ORDER — POLYETHYLENE GLYCOL 3350 17 G PO PACK
17.0000 g | PACK | Freq: Every day | ORAL | Status: DC
  Administered 2018-01-22 – 2018-01-23 (×2): 17 g via ORAL
  Filled 2018-01-21 (×2): qty 1

## 2018-01-21 MED ORDER — DOXYCYCLINE HYCLATE 100 MG PO TABS
100.0000 mg | ORAL_TABLET | Freq: Two times a day (BID) | ORAL | Status: DC
  Administered 2018-01-22 – 2018-01-23 (×4): 100 mg via ORAL
  Filled 2018-01-21 (×4): qty 1

## 2018-01-21 MED ORDER — ALTEPLASE 2 MG IJ SOLR: 2.0000 mg | Freq: Once | INTRAMUSCULAR | Status: DC | PRN

## 2018-01-21 MED ORDER — FLUTICASONE PROPIONATE 50 MCG/ACT NA SUSP: 1.0000 | Freq: Every day | NASAL | Status: DC | PRN

## 2018-01-21 MED ORDER — GABAPENTIN 100 MG PO CAPS
100.0000 mg | ORAL_CAPSULE | Freq: Every day | ORAL | Status: DC
  Administered 2018-01-22 (×2): 100 mg via ORAL
  Filled 2018-01-21 (×2): qty 1

## 2018-01-21 MED ORDER — ASPIRIN EC 81 MG PO TBEC
81.0000 mg | DELAYED_RELEASE_TABLET | Freq: Every day | ORAL | Status: DC
  Administered 2018-01-22 – 2018-01-23 (×2): 81 mg via ORAL
  Filled 2018-01-21 (×2): qty 1

## 2018-01-21 MED ORDER — MOMETASONE FURO-FORMOTEROL FUM 200-5 MCG/ACT IN AERO
2.0000 | INHALATION_SPRAY | Freq: Two times a day (BID) | RESPIRATORY_TRACT | Status: DC
  Administered 2018-01-22 – 2018-01-23 (×4): 2 via RESPIRATORY_TRACT
  Filled 2018-01-21: qty 8.8

## 2018-01-21 MED ORDER — IPRATROPIUM-ALBUTEROL 0.5-2.5 (3) MG/3ML IN SOLN
3.0000 mL | RESPIRATORY_TRACT | Status: DC
  Administered 2018-01-22 (×4): 3 mL via RESPIRATORY_TRACT
  Filled 2018-01-21 (×5): qty 3

## 2018-01-21 MED ORDER — ACETAMINOPHEN 650 MG RE SUPP: 650.0000 mg | Freq: Four times a day (QID) | RECTAL | Status: DC | PRN

## 2018-01-21 MED ORDER — HEPARIN SODIUM (PORCINE) 1000 UNIT/ML DIALYSIS: 1000.0000 [IU] | INTRAMUSCULAR | Status: DC | PRN

## 2018-01-21 NOTE — H&P (Signed)
TRH H&P   Patient Demographics:    Destiny Day, is a 67 y.o. female  MRN: [id number]   DOB - Mar 08, 1951  Admit Date - 01/21/2018  Outpatient Primary MD for the patient is Velna Hatchet, MD  Referring MD/NP/PA: Dr Gilford Raid  Outpatient Specialists: Dr Delana Meyer  Patient coming from: Home  Chief Complaint  Patient presents with  . Shortness of Breath      HPI:    Destiny Day  is a 67 y.o. female, nickel history of end-stage renal disease on hemodialysis Monday Wednesday Friday, hypertension, diabetes mellitus, heart failure, PVD status post bilateral transmetatarsal amputation, GI bleed, GERD, diverticulitis, hyperlipidemia, anemia of chronic disease, status post intracranial hemorrhage status post fall this year, status post craniectomy, recent right foot transtarsal amputation by Dr. Sharol Given on 12/31/2017, with complaints of shortness of breath, she is compliant with her hemodialysis Monday Wednesday Friday, but reports yesterday evening she did develop some dyspnea, no cough, no fever, no chills, no chest pain, this morning she was significantly uncomfortable, so she did choose to come to ED instead of her dialysis, she was increased work of breathing where she required BiPAP, did have uncontrolled blood pressure in ED, as she did not take any of her home medication yet, x-ray significant for mild interstitial pulmonary edema, with cardiomegaly and trace bilateral pleural effusion, blood pressure was significantly elevated at 209/122, as well she was noted to be tachycardic in the 140s, appears to be sinus tachycardia which improved after she received IV Cardizem ,renal were consulted who requested admission as she will receive hemodialysis today.  I was called to admit.    Review of systems:    In addition to the HPI above,  No Fever-chills, No Headache, No changes with Vision  or hearing, No problems swallowing food or Liquids, No Chest pain, Cough , reports shortness of breath No Abdominal pain, No Nausea or Vommitting, Bowel movements are regular, No Blood in stool or Urine, No dysuria, No new skin rashes or bruises, No new joints pains-aches,  No new weakness, tingling, numbness in any extremity, No recent weight gain or loss, No polyuria, polydypsia or polyphagia, No significant Mental Stressors.  A full 10 point Review of Systems was done, except as stated above, all other Review of Systems were negative.   With Past History of the following :    Past Medical History:  Diagnosis Date  . Abdominal bruit   . Anemia   . Anxiety   . Arthritis    Osteoarthritis  . Asthma   . Cervical disc disease    "pinced nerve"  . CHF (congestive heart failure) ([place])   . Complication of anesthesia    " after I got home from my last procedure, I started itching."  . Diabetes mellitus    Type II  . Diverticulitis   . ESRD on peritoneal dialysis ([place])  Hemodialysis - MWF- [place]   . GERD (gastroesophageal reflux disease)    from medications  . GI bleed 03/31/2013  . History of hiatal hernia   . Hyperlipidemia   . Hypertension   . Neuropathy    left leg  . Osteoporosis   . Peripheral vascular disease ([place])   . Pneumonia    "very young"  . Seasonal allergies   . Shortness of breath dyspnea    WIth exertion  . Sleep apnea    can't afford cpap      Past Surgical History:  Procedure Laterality Date  . A/V SHUNTOGRAM N/A 09/22/2016   Procedure: A/V Shuntogram - left arm;  Surgeon: Serafina Mitchell, MD;  Location: [facility];  Service: Cardiovascular;  Laterality: N/A;  . ABDOMINAL HYSTERECTOMY  1993`  . AMPUTATION Left 09/01/2016   Procedure: LEFT FOOT TRANSMETATARSAL AMPUTATION;  Surgeon: Newt Minion, MD;  Location: [facility];  Service: Orthopedics;  Laterality: Left;  . AMPUTATION Right 08/11/2017   Procedure: RIGHT GREAT TOE AMPUTATION DIGIT;   Surgeon: Rosetta Posner, MD;  Location: [facility];  Service: Vascular;  Laterality: Right;  . AMPUTATION Right 12/31/2017   Procedure: RIGHT TRANSMETATARSAL AMPUTATION;  Surgeon: Newt Minion, MD;  Location: [facility];  Service: Orthopedics;  Laterality: Right;  . AV FISTULA PLACEMENT Left 04/21/2016   Procedure: INSERTION OF ARTERIOVENOUS (AV) GORE-TEX GRAFT ARM LEFT;  Surgeon: Elam Dutch, MD;  Location: [facility];  Service: Vascular;  Laterality: Left;  . [place] TRANSPOSITION Left 07/10/2014   Procedure: BASCILIC VEIN TRANSPOSITION;  Surgeon: Angelia Mould, MD;  Location: [facility];  Service: Vascular;  Laterality: Left;  . BASCILIC VEIN TRANSPOSITION Right 11/08/2014   Procedure: FIRST STAGE BASILIC VEIN TRANSPOSITION;  Surgeon: Angelia Mould, MD;  Location: [facility];  Service: Vascular;  Laterality: Right;  . BASCILIC VEIN TRANSPOSITION Right 01/18/2015   Procedure: SECOND STAGE BASILIC VEIN TRANSPOSITION;  Surgeon: Angelia Mould, MD;  Location: [facility];  Service: Vascular;  Laterality: Right;  . CRANIOTOMY N/A 08/23/2017   Procedure: CRANIOTOMY HEMATOMA EVACUATION SUBDURAL;  Surgeon: Ashok Pall, MD;  Location: [facility];  Service: Neurosurgery;  Laterality: N/A;  . CRANIOTOMY Left 08/24/2017   Procedure: CRANIOTOMY FOR RECURRENT ACUTE SUBDURAL HEMATOMA;  Surgeon: Ashok Pall, MD;  Location: [facility];  Service: Neurosurgery;  Laterality: Left;  . ESOPHAGOGASTRODUODENOSCOPY N/A 03/31/2013   Procedure: ESOPHAGOGASTRODUODENOSCOPY (EGD);  Surgeon: Gatha Mayer, MD;  Location: [facility];  Service: Endoscopy;  Laterality: N/A;  . EYE SURGERY     laser surgery  . FEMORAL-POPLITEAL BYPASS GRAFT Left 07/08/2016   Procedure: LEFT  FEMORAL-BELOW KNEE POPLITEAL ARTERY BYPASS GRAFT USING 6MM X 80 CM PROPATEN GORETEX GRAFT WITH RINGS.;  Surgeon: Rosetta Posner, MD;  Location: [facility];  Service: Vascular;  Laterality: Left;  . FEMORAL-POPLITEAL BYPASS GRAFT Right 08/11/2017   Procedure: RIGHT  FEMORAL TO BELOW KNEE POPLITEAL ARTERKY  BYPASS GRAFT USING 6MM RINGED PROPATEN GRAFT;  Surgeon: Rosetta Posner, MD;  Location: [facility];  Service: Vascular;  Laterality: Right;  . FISTULOGRAM Left 10/29/2014   Procedure: FISTULOGRAM;  Surgeon: Angelia Mould, MD;  Location: [facility];  Service: Cardiovascular;  Laterality: Left;  . LIGATION OF ARTERIOVENOUS  FISTULA Left 04/21/2016   Procedure: LIGATION OF ARTERIOVENOUS  FISTULA LEFT ARM;  Surgeon: Elam Dutch, MD;  Location: [facility];  Service: Vascular;  Laterality: Left;  . LOWER EXTREMITY ANGIOGRAPHY N/A 04/06/2017   Procedure: Lower Extremity  Angiography - Right;  Surgeon: Serafina Mitchell, MD;  Location: [facility];  Service: Cardiovascular;  Laterality: N/A;  . PATCH ANGIOPLASTY Right 01/18/2015   Procedure: BASILIC VEIN PATCH ANGIOPLASTY USING VASCUGUARD PATCH;  Surgeon: Angelia Mould, MD;  Location: [facility];  Service: Vascular;  Laterality: Right;  . PERIPHERAL VASCULAR BALLOON ANGIOPLASTY  09/22/2016   Procedure: Peripheral Vascular Balloon Angioplasty;  Surgeon: Serafina Mitchell, MD;  Location: [facility];  Service: Cardiovascular;;  Lt. Fistula  . PERIPHERAL VASCULAR CATHETERIZATION N/A 06/23/2016   Procedure: Abdominal Aortogram w/Lower Extremity;  Surgeon: Serafina Mitchell, MD;  Location: [facility];  Service: Cardiovascular;  Laterality: N/A;  . PERIPHERAL VASCULAR CATHETERIZATION  06/23/2016   Procedure: Peripheral Vascular Intervention;  Surgeon: Serafina Mitchell, MD;  Location: [facility];  Service: Cardiovascular;;  lt common and external illiac artery      Social History:     Social History   Tobacco Use  . Smoking status: Former Smoker    Packs/day: 0.35    Years: 40.00    Pack years: 14.00    Types: Cigarettes    Last attempt to quit: 05/10/2012    Years since quitting: 5.7  . Smokeless tobacco: Never Used  Substance Use Topics  . Alcohol use: No     Lives - at  home  Mobility - with assistance     Family History :     Family History  Problem Relation Age of Onset  . Other Mother        not sure of cause of death  . Diabetes Father   . Pancreatic cancer Maternal Grandmother   . Colon cancer Neg Hx       Home Medications:   Prior to Admission medications   Medication Sig Start Date End Date Taking? Authorizing Provider  albuterol (PROVENTIL HFA;VENTOLIN HFA) 108 (90 BASE) MCG/ACT inhaler Inhale 1-2 puffs into the lungs every 6 (six) hours as needed for wheezing or shortness of breath.    [provider]  Amino Acids-Protein Hydrolys (FEEDING SUPPLEMENT, PRO-STAT SUGAR FREE 64,) LIQD Take 30 mLs by mouth 2 (two) times daily. 09/16/17   Love, Ivan Anchors, PA-C  aspirin EC 81 MG tablet Take 81 mg by mouth daily.    [provider]  atorvastatin (LIPITOR) 20 MG tablet Take 20 mg by mouth at bedtime.     [provider]  budesonide-formoterol (SYMBICORT) 160-4.5 MCG/ACT inhaler Inhale 1 puff into the lungs daily as needed (for respiratory issues).     [provider]  carvedilol (COREG) 12.5 MG tablet Take 12.5 mg by mouth 2 (two) times daily with a meal.    [provider]  cloNIDine (CATAPRES) 0.2 MG tablet Take 1 tablet (0.2 mg total) by mouth 3 (three) times daily. 09/16/17   Love, Ivan Anchors, PA-C  clopidogrel (PLAVIX) 75 MG tablet Take 75 mg by mouth daily.    [provider]  Dakins (SODIUM HYPOCHLORITE) external solution Apply topically daily. Cleanse groin and cover with damp gauze soaked in dakins. 09/16/17   Love, Ivan Anchors, PA-C  diazepam (VALIUM) 5 MG tablet Take 5 mg by mouth 2 (two) times daily as needed for anxiety.    [provider]  doxycycline (VIBRA-TABS) 100 MG tablet Take 1 tablet (100 mg total) by mouth 2 (two) times daily. 12/23/17   Newt Minion, MD  fluticasone (FLONASE) 50 MCG/ACT nasal spray Place 1 spray into both nostrils  daily as needed for allergies or  rhinitis.    [provider]  gabapentin (NEURONTIN) 100 MG capsule TAKE 1 CAPSULE (100 MG TOTAL) BY MOUTH AT BEDTIME. WHEN NECESSARY FOR NEUROPATHY PAIN 01/11/18   Newt Minion, MD  hydrALAZINE (APRESOLINE) 50 MG tablet Take 1 tab 2 times a day on non dialysis days. Patient taking differently: Take 50 mg by mouth 2 (two) times daily.  09/16/17   Love, Ivan Anchors, PA-C  hydrocerin (EUCERIN) CREA Apply 154 application topically 2 (two) times daily. 09/16/17   Love, Ivan Anchors, PA-C  insulin NPH Human (HUMULIN N,NOVOLIN N) 100 UNIT/ML injection Inject 10 Units into the skin at bedtime.    [provider]  insulin regular (NOVOLIN R,HUMULIN R) 100 units/mL injection Inject 3-8 Units into the skin 3 (three) times daily before meals. Per sliding scale    [provider]  LEVEMIR FLEXTOUCH 100 UNIT/ML Pen Inject 7 Units into the skin at bedtime. 09/16/17   Love, Ivan Anchors, PA-C  loratadine (CLARITIN) 10 MG tablet Take 10 mg by mouth daily as needed for allergies.    [provider]  multivitamin (RENA-VIT) TABS tablet Take 1 tablet by mouth daily.  01/24/15   [provider]  omeprazole (PRILOSEC) 20 MG capsule Take 20 mg by mouth daily.     [provider]  oxyCODONE-acetaminophen (PERCOCET/ROXICET) 5-325 MG tablet Take 1 tablet by mouth every 4 (four) hours as needed for severe pain. 12/31/17   Newt Minion, MD  polyethylene glycol [facility] / Floria Raveling) packet Take 17 g by mouth daily. Patient taking differently: Take 17 g by mouth daily as needed for moderate constipation.  09/17/17   Bary Leriche, PA-C     Allergies:     Allergies  Allergen Reactions  . Lisinopril Cough  . Prednisone Swelling and Other (See Comments)    Excessive fluid buildup     Physical Exam:   Vitals  Blood pressure (!) 203/99, pulse 92, temperature 99 F (37.2 C), temperature source Oral, resp. rate (!) 29, SpO2 100 %.   1. General elderly female sitting in bed in  mild discomfort secondary to dyspnea  2. Normal affect and insight, Not Suicidal or Homicidal, Awake Alert, Oriented X 3.  3. No F.N deficits, ALL C.Nerves Intact, Strength 5/5 all 4 extremities, Sensation intact all 4 extremities, Plantars down going.  4. Ears and Eyes appear Normal, Conjunctivae clear, PERRLA. Moist Oral Mucosa.  5. Supple Neck,  No cervical lymphadenopathy appriciated, No Carotid Bruits.  6. Symmetrical Chest wall movement, entry bilaterally, diminished at the bases with some crackles  7. RRR, No Gallops, Rubs or Murmurs, No Parasternal Heave.  8. Positive Bowel Sounds, Abdomen Soft, No tenderness, No organomegaly appriciated,No rebound -guarding or rigidity.  9.  No Cyanosis, Normal Skin Turgor, No Skin Rash or Bruise.  10. Good muscle tone,  joints appear normal , no effusions, Normal ROM.  Metatarsal amputation bilaterally, right transmetatarsal recent surgical site looks clean, with no dehiscence, stitches still there(dr Sharol Given supposed to take out next week)  11. No Palpable Lymph Nodes in Neck or Axillae     Data Review:    CBC Recent Labs  Lab 01/21/18 0942  WBC 8.1  HGB 10.0*  HCT 33.7*  PLT 232  MCV 98.5  MCH 29.2  MCHC 29.7*  RDW 15.0   ------------------------------------------------------------------------------------------------------------------  Chemistries  Recent Labs  Lab 01/21/18 0942  NA 138  K 3.1*  CL 96*  CO2 30  GLUCOSE 149*  BUN 10  CREATININE 4.74*  CALCIUM 9.3   ------------------------------------------------------------------------------------------------------------------ estimated creatinine clearance is 10.5 mL/min (A) (by C-G formula based on SCr of 4.74 mg/dL (H)). ------------------------------------------------------------------------------------------------------------------ No results for input(s): TSH, T4TOTAL, T3FREE, THYROIDAB in the last 72 hours.  Invalid input(s): FREET3  Coagulation profile No  results for input(s): INR, PROTIME in the last 168 hours. ------------------------------------------------------------------------------------------------------------------- No results for input(s): DDIMER in the last 72 hours. -------------------------------------------------------------------------------------------------------------------  Cardiac Enzymes No results for input(s): CKMB, TROPONINI, MYOGLOBIN in the last 168 hours.  Invalid input(s): CK ------------------------------------------------------------------------------------------------------------------    Component Value Date/Time   BNP 966.4 (H) 10/09/2015 1952     ---------------------------------------------------------------------------------------------------------------  Urinalysis    Component Value Date/Time   COLORURINE YELLOW 07/07/2016 0914   APPEARANCEUR CLOUDY (A) 07/07/2016 0914   LABSPEC >1.030 (H) 07/07/2016 0914   PHURINE 5.5 07/07/2016 0914   GLUCOSEU NEGATIVE 07/07/2016 0914   HGBUR SMALL (A) 07/07/2016 0914   BILIRUBINUR SMALL (A) 07/07/2016 0914   KETONESUR 15 (A) 07/07/2016 0914   PROTEINUR >300 (A) 07/07/2016 0914   UROBILINOGEN 0.2 07/30/2014 2000   NITRITE NEGATIVE 07/07/2016 0914   LEUKOCYTESUR SMALL (A) 07/07/2016 0914    ----------------------------------------------------------------------------------------------------------------   Imaging Results:    Dg Chest 2 View  Result Date: 01/21/2018 CLINICAL DATA:  Acute cough and shortness of breath. EXAM: CHEST - 2 VIEW COMPARISON:  08/27/2017 and prior radiograph FINDINGS: Cardiomegaly with pulmonary vascular congestion and mild interstitial edema noted. There are trace bilateral pleural effusions present. No pneumothorax or acute bony abnormality. IMPRESSION: Cardiomegaly with mild interstitial pulmonary edema and trace bilateral pleural effusions. Electronically Signed   By: Margarette Canada M.D.   On: 01/21/2018 10:01    My personal  review of EKG: Rhythm NSR, Rate 94/min, QTc 472,    Assessment & Plan:    Active Problems:   Asthma   GERD (gastroesophageal reflux disease)   ESRD on dialysis ([place])   Diabetes mellitus ([place])   Anxiety state   Benign essential HTN   Anemia of chronic disease   Diabetic peripheral neuropathy ([facility])   Chronic obstructive pulmonary disease (H[facility])   Respiratory failure ([facility])   Acute hypoxic respiratory failure -Patient presents with significantly increased work of breathing, and volume overload on chest x-ray, she is compliant with her dialysis, she could not go to dialysis this morning giving her significant dyspnea and came to ED instead, I have discussed with renal, they will dialyze her soon, hopefully this will improve her symptoms, there is no evidence of infection. -Significantly uncontrolled blood pressure most likely contributing to her dyspnea as well -If no improvement of her symptoms after appropriate dialysis then may need CTA chest to rule out PE as discussed with renal.  Hypertensive urgency -Most likely due to her discomfort, as well she did not take her blood pressure medication as she was in ED, will start on PRN hydralazine and resume on home medication, and this should improve after she is dialyzed.  COPD -No active wheezing currently, respiratory failure looks more volume overload, continue with home medication Symbicort, continue with scheduled duo nebs.  I will give 1 dose of Solu-Medrol may be to give her some symptomatic relief of dyspnea  Tachycardia -Tachycardia, most likely due to discomfort from dyspnea  status post recent right transmetatarsal amputation -By Dr. Sharol Given, wound looks clean.  End-stage renal disease -Renal consulted for dialysis, they will start soon giving her respiratory distress  Diabetes mellitus -Continue with home dose NPH, will add low  scale insulin sliding scale -January gabapentin for diabetic neuropathy  CHF -Volume management  with dialysis  hypokalemia -On dialysis  PVD -Continue with aspirin and Plavix  GERD -Continue with PPI  Hyperlipidemia -Continue with home dose statin  Anxiety -With home dose Valium  Anemia of chronic disease -At baseline  Recent history of subdural hematoma -Denies any new neurological findings     DVT Prophylaxis  SCDs  AM Labs Ordered, also please review Full Orders  Family Communication: Admission, patients condition and plan of care including tests being ordered have been discussed with the patient and husbandwho indicate understanding and agree with the plan and Code Status.  Code Status full  Likely DC to  Home  Condition GUARDED    Consults called: renal    Admission status: observation  Time spent in minutes : 60 minutes   Phillips Climes M.D on 01/21/2018 at 5:20 PM  Between 7am to 7pm - Pager - [phone]. After 7pm go to [email or web address] - password [facility]  [facility] - Office  [phone]

## 2018-01-21 NOTE — ED Notes (Signed)
MD aware IV team unable to get IV on patient. MD at bedside.

## 2018-01-21 NOTE — ED Triage Notes (Signed)
Pt to ER for evaluation of shortness of breath onset last week, states it went away the day before yesterday and returned today. Pt in NAD. Reports hx of CHF and asthma. Denies pain. VSS.

## 2018-01-21 NOTE — ED Provider Notes (Signed)
[facility] EMERGENCY DEPARTMENT Provider Note   CSN: [id number] Arrival date & time: 01/21/18  0908     History   Chief Complaint Chief Complaint  Patient presents with  . Shortness of Breath    HPI Destiny Day is a 67 y.o. female.  Pt presents to the ED today with sob.  The pt is a dialysis pt and is due for dialysis today.  Due to sob, her husband brought her here.  She did not take any of her meds prior to coming here.  The pt denies f/c.     Past Medical History:  Diagnosis Date  . Abdominal bruit   . Anemia   . Anxiety   . Arthritis    Osteoarthritis  . Asthma   . Cervical disc disease    "pinced nerve"  . CHF (congestive heart failure) ([place])   . Complication of anesthesia    " after I got home from my last procedure, I started itching."  . Diabetes mellitus    Type II  . Diverticulitis   . ESRD on peritoneal dialysis [facility])    Hemodialysis - MWF- [place]   . GERD (gastroesophageal reflux disease)    from medications  . GI bleed 03/31/2013  . History of hiatal hernia   . Hyperlipidemia   . Hypertension   . Neuropathy    left leg  . Osteoporosis   . Peripheral vascular disease ([place])   . Pneumonia    "very young"  . Seasonal allergies   . Shortness of breath dyspnea    WIth exertion  . Sleep apnea    can't afford cpap    Patient Active Problem List   Diagnosis Date Noted  . Subacute osteomyelitis, right ankle and foot ([place])   . Gangrene of toe of right foot ([place]) 12/23/2017  . Ulcer of toe of right foot, limited to breakdown of skin ([place]) 11/09/2017  . Achilles tendon contracture, right 11/09/2017  . Labile blood glucose   . Anemia of infection and chronic disease   . Essential hypertension   . Black stool   . Right sided weakness   . Subdural hemorrhage following injury ([place]) 08/30/2017  . Diabetic peripheral neuropathy ([place])   . Chronic obstructive pulmonary disease ([place])   . Seizure prophylaxis   . Subdural  hematoma ([place]) 08/24/2017  . Traumatic subdural hematoma ([place]) 08/23/2017  . Fall   . SDH (subdural hematoma) ([place])   . Acute respiratory failure with hypoxia ([place])   . Diabetes mellitus type 2 in nonobese (HCC)   . Leukocytosis   . Labile blood pressure   . Generalized anxiety disorder   . Debility 08/16/2017  . Amputated great toe of right foot ([place])   . Amputated toe of left foot ([place])   . Post-operative pain   . ESRD on dialysis ([place])   . Diabetes mellitus ([name])   . Anxiety state   . Benign essential HTN   . Acute blood loss anemia   . Anemia of chronic disease   . Idiopathic chronic venous hypertension of both lower extremities with inflammation 10/13/2016  . S/P transmetatarsal amputation of foot, left ([place]) 09/21/2016  . Onychomycosis 08/15/2016  . Gangrene of toe of left foot ([place])   . PAD (peripheral artery disease) ([place]) 07/08/2016  . Atherosclerosis of native artery of left lower extremity with gangrene ([place]) 06/23/2016  . GERD (gastroesophageal reflux disease) 10/07/2015  . ARF (acute renal failure) ([place])   . Hypothermia  06/12/2015  . Acute on chronic renal failure ([place]) 06/12/2015  . CHF (congestive heart failure) ([place]) 07/30/2014  . CKD (chronic kidney disease) stage 4, GFR 15-29 ml/min (HCC) 06/27/2014  . Hypertensive heart disease 05/25/2013  . CKD (chronic kidney disease) stage 3, GFR 30-59 ml/min (HCC) 05/25/2013  . Pulmonary edema 05/23/2013  . Type 2 diabetes mellitus with diabetic nephropathy ([place]) 05/23/2013  . Type 2 diabetes mellitus with hyperosmolar nonketotic hyperglycemia ([place]) 04/13/2013  . Chronic diastolic CHF (congestive heart failure) ([place]) 04/13/2013  . UGI bleed 03/31/2013  . Hypokalemia 08/24/2012  . Type II or unspecified type diabetes mellitus without mention of complication, not stated as uncontrolled 12/27/2007  . HLD (hyperlipidemia) 12/27/2007  . ALLERGIC RHINITIS 12/27/2007  . Asthma 12/27/2007    Past Surgical History:    Procedure Laterality Date  . A/V SHUNTOGRAM N/A 09/22/2016   Procedure: A/V Shuntogram - left arm;  Surgeon: Serafina Mitchell, MD;  Location: [facility] [place] CV LAB;  Service: Cardiovascular;  Laterality: N/A;  . ABDOMINAL HYSTERECTOMY  1993`  . AMPUTATION Left 09/01/2016   Procedure: LEFT FOOT TRANSMETATARSAL AMPUTATION;  Surgeon: Newt Minion, MD;  Location: [facility];  Service: Orthopedics;  Laterality: Left;  . AMPUTATION Right 08/11/2017   Procedure: RIGHT GREAT TOE AMPUTATION DIGIT;  Surgeon: Rosetta Posner, MD;  Location: [facility];  Service: Vascular;  Laterality: Right;  . AMPUTATION Right 12/31/2017   Procedure: RIGHT TRANSMETATARSAL AMPUTATION;  Surgeon: Newt Minion, MD;  Location: [facility];  Service: Orthopedics;  Laterality: Right;  . AV FISTULA PLACEMENT Left 04/21/2016   Procedure: INSERTION OF ARTERIOVENOUS (AV) GORE-TEX GRAFT ARM LEFT;  Surgeon: Elam Dutch, MD;  Location: [facility];  Service: Vascular;  Laterality: Left;  . [place] TRANSPOSITION Left 07/10/2014   Procedure: BASCILIC VEIN TRANSPOSITION;  Surgeon: Angelia Mould, MD;  Location: [place] [place];  Service: Vascular;  Laterality: Left;  . BASCILIC VEIN TRANSPOSITION Right 11/08/2014   Procedure: FIRST STAGE BASILIC VEIN TRANSPOSITION;  Surgeon: Angelia Mould, MD;  Location: [facility];  Service: Vascular;  Laterality: Right;  . BASCILIC VEIN TRANSPOSITION Right 01/18/2015   Procedure: SECOND STAGE BASILIC VEIN TRANSPOSITION;  Surgeon: Angelia Mould, MD;  Location: [facility];  Service: Vascular;  Laterality: Right;  . CRANIOTOMY N/A 08/23/2017   Procedure: CRANIOTOMY HEMATOMA EVACUATION SUBDURAL;  Surgeon: Ashok Pall, MD;  Location: [facility];  Service: Neurosurgery;  Laterality: N/A;  . CRANIOTOMY Left 08/24/2017   Procedure: CRANIOTOMY FOR RECURRENT ACUTE SUBDURAL HEMATOMA;  Surgeon: Ashok Pall, MD;  Location: [facility];  Service: Neurosurgery;  Laterality: Left;  . ESOPHAGOGASTRODUODENOSCOPY N/A 03/31/2013   Procedure:  ESOPHAGOGASTRODUODENOSCOPY (EGD);  Surgeon: Gatha Mayer, MD;  Location: [facility] ENDOSCOPY;  Service: Endoscopy;  Laterality: N/A;  . EYE SURGERY     laser surgery  . FEMORAL-POPLITEAL BYPASS GRAFT Left 07/08/2016   Procedure: LEFT  FEMORAL-BELOW KNEE POPLITEAL ARTERY BYPASS GRAFT USING 6MM X 80 CM PROPATEN GORETEX GRAFT WITH RINGS.;  Surgeon: Rosetta Posner, MD;  Location: [facility];  Service: Vascular;  Laterality: Left;  . FEMORAL-POPLITEAL BYPASS GRAFT Right 08/11/2017   Procedure: RIGHT FEMORAL TO BELOW KNEE POPLITEAL ARTERKY  BYPASS GRAFT USING 6MM RINGED PROPATEN GRAFT;  Surgeon: Rosetta Posner, MD;  Location: [facility];  Service: Vascular;  Laterality: Right;  . FISTULOGRAM Left 10/29/2014   Procedure: FISTULOGRAM;  Surgeon: Angelia Mould, MD;  Location: [facility] CATH LAB;  Service: Cardiovascular;  Laterality: Left;  . LIGATION OF ARTERIOVENOUS  FISTULA Left  04/21/2016   Procedure: LIGATION OF ARTERIOVENOUS  FISTULA LEFT ARM;  Surgeon: Elam Dutch, MD;  Location: [facility];  Service: Vascular;  Laterality: Left;  . LOWER EXTREMITY ANGIOGRAPHY N/A 04/06/2017   Procedure: Lower Extremity Angiography - Right;  Surgeon: Serafina Mitchell, MD;  Location: [place] [facility] CV LAB;  Service: Cardiovascular;  Laterality: N/A;  . PATCH ANGIOPLASTY Right 01/18/2015   Procedure: BASILIC VEIN PATCH ANGIOPLASTY USING VASCUGUARD PATCH;  Surgeon: Angelia Mould, MD;  Location: [facility];  Service: Vascular;  Laterality: Right;  . PERIPHERAL VASCULAR BALLOON ANGIOPLASTY  09/22/2016   Procedure: Peripheral Vascular Balloon Angioplasty;  Surgeon: Serafina Mitchell, MD;  Location: [facility] CV LAB;  Service: Cardiovascular;;  Lt. Fistula  . PERIPHERAL VASCULAR CATHETERIZATION N/A 06/23/2016   Procedure: Abdominal Aortogram w/Lower Extremity;  Surgeon: Serafina Mitchell, MD;  Location: [facility] CV LAB;  Service: Cardiovascular;  Laterality: N/A;  . PERIPHERAL VASCULAR CATHETERIZATION  06/23/2016   Procedure: Peripheral Vascular  Intervention;  Surgeon: Serafina Mitchell, MD;  Location: [place] CV LAB;  Service: Cardiovascular;;  lt common and external illiac artery     OB History   None      Home Medications    Prior to Admission medications   Medication Sig Start Date End Date Taking? Authorizing Provider  albuterol (PROVENTIL HFA;VENTOLIN HFA) 108 (90 BASE) MCG/ACT inhaler Inhale 1-2 puffs into the lungs every 6 (six) hours as needed for wheezing or shortness of breath.    [provider]  Amino Acids-Protein Hydrolys (FEEDING SUPPLEMENT, PRO-STAT SUGAR FREE 64,) LIQD Take 30 mLs by mouth 2 (two) times daily. 09/16/17   Love, Ivan Anchors, PA-C  aspirin EC 81 MG tablet Take 81 mg by mouth daily.    [provider]  atorvastatin (LIPITOR) 20 MG tablet Take 20 mg by mouth at bedtime.     [provider]  budesonide-formoterol (SYMBICORT) 160-4.5 MCG/ACT inhaler Inhale 1 puff into the lungs daily as needed (for respiratory issues).     [provider]  carvedilol (COREG) 12.5 MG tablet Take 12.5 mg by mouth 2 (two) times daily with a meal.    [provider]  cloNIDine (CATAPRES) 0.2 MG tablet Take 1 tablet (0.2 mg total) by mouth 3 (three) times daily. 09/16/17   Love, Ivan Anchors, PA-C  clopidogrel (PLAVIX) 75 MG tablet Take 75 mg by mouth daily.    [provider]  Dakins (SODIUM HYPOCHLORITE) external solution Apply topically daily. Cleanse groin and cover with damp gauze soaked in dakins. 09/16/17   Love, Ivan Anchors, PA-C  diazepam (VALIUM) 5 MG tablet Take 5 mg by mouth 2 (two) times daily as needed for anxiety.    [provider]  doxycycline (VIBRA-TABS) 100 MG tablet Take 1 tablet (100 mg total) by mouth 2 (two) times daily. 12/23/17   Newt Minion, MD  fluticasone (FLONASE) 50 MCG/ACT nasal spray Place 1 spray into both nostrils daily as needed for allergies or rhinitis.    [provider]  gabapentin (NEURONTIN) 100 MG capsule TAKE 1 CAPSULE  (100 MG TOTAL) BY MOUTH AT BEDTIME. WHEN NECESSARY FOR NEUROPATHY PAIN 01/11/18   Newt Minion, MD  hydrALAZINE (APRESOLINE) 50 MG tablet Take 1 tab 2 times a day on non dialysis days. Patient taking differently: Take 50 mg by mouth 2 (two) times daily.  09/16/17   Love, Ivan Anchors, PA-C  hydrocerin (EUCERIN) CREA Apply 536 application topically 2 (two) times daily. 09/16/17  Love, Pamela S, PA-C  insulin NPH Human (HUMULIN N,NOVOLIN N) 100 UNIT/ML injection Inject 10 Units into the skin at bedtime.    [provider]  insulin regular (NOVOLIN R,HUMULIN R) 100 units/mL injection Inject 3-8 Units into the skin 3 (three) times daily before meals. Per sliding scale    [provider]  LEVEMIR FLEXTOUCH 100 UNIT/ML Pen Inject 7 Units into the skin at bedtime. 09/16/17   Love, Ivan Anchors, PA-C  loratadine (CLARITIN) 10 MG tablet Take 10 mg by mouth daily as needed for allergies.    [provider]  multivitamin (RENA-VIT) TABS tablet Take 1 tablet by mouth daily.  01/24/15   [provider]  omeprazole (PRILOSEC) 20 MG capsule Take 20 mg by mouth daily.     [provider]  oxyCODONE-acetaminophen (PERCOCET/ROXICET) 5-325 MG tablet Take 1 tablet by mouth every 4 (four) hours as needed for severe pain. 12/31/17   Newt Minion, MD  polyethylene glycol [facility] / Floria [facility]) packet Take 17 g by mouth daily. Patient taking differently: Take 17 g by mouth daily as needed for moderate constipation.  09/17/17   Bary Leriche, PA-C    Family History Family History  Problem Relation Age of Onset  . Other Mother        not sure of cause of death  . Diabetes Father   . Pancreatic cancer Maternal Grandmother   . Colon cancer Neg Hx     Social History Social History   Tobacco Use  . Smoking status: Former Smoker    Packs/day: 0.35    Years: 40.00    Pack years: 14.00    Types: Cigarettes    Last attempt to quit: 05/10/2012    Years since quitting: 5.7  .  Smokeless tobacco: Never Used  Substance Use Topics  . Alcohol use: No  . Drug use: No    Comment: marijuana; quit in early 1980's     Allergies   Lisinopril and Prednisone   Review of Systems Review of Systems  Respiratory: Positive for shortness of breath.   All other systems reviewed and are negative.    Physical Exam Updated Vital Signs BP (!) 203/99   Pulse 92   Temp 99 F (37.2 C) (Oral)   Resp (!) 29   SpO2 100%   Physical Exam  Constitutional: She is oriented to person, place, and time. She appears well-developed. She appears distressed.  HENT:  Head: Normocephalic and atraumatic.  Mouth/Throat: Oropharynx is clear and moist.  Eyes: Pupils are equal, round, and reactive to light. EOM are normal.  Neck: Normal range of motion. Neck supple.  Cardiovascular: An irregular rhythm present. Tachycardia present.  Pulmonary/Chest: Accessory muscle usage present. Tachypnea noted. She is in respiratory distress. She has rales.  Abdominal: Soft. Bowel sounds are normal.  Musculoskeletal: Normal range of motion.  Bilateral leg metatarsal amputations  Neurological: She is alert and oriented to person, place, and time.  Skin: Skin is warm. Capillary refill takes less than 2 seconds.  Psychiatric: She has a normal mood and affect. Her behavior is normal.  Nursing note and vitals reviewed.    ED Treatments / Results  Labs (all labs ordered are listed, but only abnormal results are displayed) Labs Reviewed  BASIC METABOLIC PANEL - Abnormal; Notable for the following components:      Result Value   Potassium 3.1 (*)    Chloride 96 (*)    Glucose, Bld 149 (*)    Creatinine,  Ser 4.74 (*)    GFR calc non Af Amer 9 (*)    GFR calc Af Amer 10 (*)    All other components within normal limits  CBC - Abnormal; Notable for the following components:   RBC 3.42 (*)    Hemoglobin 10.0 (*)    HCT 33.7 (*)    MCHC 29.7 (*)    All other components within normal limits  I-STAT  TROPONIN, ED    EKG EKG Interpretation  Date/Time:  Friday January 21 2018 14:59:01 EDT Ventricular Rate:  94 PR Interval:    QRS Duration: 90 QT Interval:  377 QTC Calculation: 472 R Axis:   50 Text Interpretation:  Sinus rhythm Probable left atrial enlargement Anteroseptal infarct, old Since last tracing rate slower Confirmed by Isla Pence [phone]) on 01/21/2018 4:56:12 PM   Radiology Dg Chest 2 View  Result Date: 01/21/2018 CLINICAL DATA:  Acute cough and shortness of breath. EXAM: CHEST - 2 VIEW COMPARISON:  08/27/2017 and prior radiograph FINDINGS: Cardiomegaly with pulmonary vascular congestion and mild interstitial edema noted. There are trace bilateral pleural effusions present. No pneumothorax or acute bony abnormality. IMPRESSION: Cardiomegaly with mild interstitial pulmonary edema and trace bilateral pleural effusions. Electronically Signed   By: Margarette Canada M.D.   On: 01/21/2018 10:01    Procedures Procedures (including critical care time)  Medications Ordered in ED Medications  diltiazem (CARDIZEM) 1 mg/mL load via infusion 20 mg (20 mg Intravenous Bolus from Bag 01/21/18 1431)  hydrALAZINE (APRESOLINE) injection 20 mg (20 mg Intravenous Given 01/21/18 1650)     Initial Impression / Assessment and Plan / ED Course  I have reviewed the triage vital signs and the nursing notes.  Pertinent labs & imaging results that were available during my care of the patient were reviewed by me and considered in my medical decision making (see chart for details).  CRITICAL CARE Performed by: Isla Pence   Total critical care time: 30 minutes  Critical care time was exclusive of separately billable procedures and treating other patients.  Critical care was necessary to treat or prevent imminent or life-threatening deterioration.  Critical care was time spent personally by me on the following activities: development of treatment plan with patient and/or surrogate as well as  nursing, discussions with consultants, evaluation of patient's response to treatment, examination of patient, obtaining history from patient or surrogate, ordering and performing treatments and interventions, ordering and review of laboratory studies, ordering and review of radiographic studies, pulse oximetry and re-evaluation of patient's condition.    Pt was a very hard stick, so labs/IV treatment were delayed.  IV access obtained by Dr. Darl Householder under US guidance.  Pt initially put on bipap as she did have CHF on cxr and does not urinate much.  Bipap has made her feel much better.  She looks much more comfortable now.  Pt also tachycardic when she came back to her room.  I could not tell if she was in afib or sinus tachy, so I gave her a 20 mg cardizem bolus and put her on a drip.  The cardizem has helped hr and now that it is slowed, it is definitely sinus.  The pt's bp is still elevated despite the cardizem, so she will be given hydralazine.  The pt will be taken off the cardizem drip as she is not in a.fib.  We will attempt to take her off bipap as well.  Pt d/w Dr. Carolin Sicks (nephrology) who will put in dialysis  orders.  Pt d/w Dr. Waldron Labs (triad) for admission.  Final Clinical Impressions(s) / ED Diagnoses   Final diagnoses:  Acute on chronic congestive heart failure, unspecified heart failure type ([place])  ESRD on hemodialysis ([place])  Malignant hypertension  Sinus tachycardia    ED Discharge Orders    None       Isla Pence, MD 01/21/18 1701

## 2018-01-21 NOTE — ED Notes (Signed)
IV team at bedside 

## 2018-01-21 NOTE — ED Notes (Signed)
Family made aware of patient condition

## 2018-01-21 NOTE — ED Provider Notes (Signed)
I was asked to place IV for rapid afib. Care per primary team.   Angiocath insertion Performed by: Wandra Arthurs  Consent: Verbal consent obtained. Risks and benefits: risks, benefits and alternatives were discussed Time out: Immediately prior to procedure a "time out" was called to verify the correct patient, procedure, equipment, support staff and site/side marked as required.  Preparation: Patient was prepped and draped in the usual sterile fashion.  Vein Location: R cephalic  Ultrasound Guided  Gauge: 20 long   Normal blood return and flush without difficulty Patient tolerance: Patient tolerated the procedure well with no immediate complications.      Drenda Freeze, MD 01/21/18 1539

## 2018-01-21 NOTE — ED Notes (Signed)
Husband reports pt is shaking. Gave blankets and checked rectal temp. MD [place] aware and at bedside. PRN Ativan administered.

## 2018-01-21 NOTE — Progress Notes (Signed)
Pt transported to HD via bipap w/ no apparent complications.  Report given to Floor RT, and HD RT aware of bipap pt.

## 2018-01-21 NOTE — ED Notes (Signed)
RT called advised of Bipap order.

## 2018-01-21 NOTE — Consult Note (Addendum)
[facility]: Reason for Consult: To manage dialysis and dialysis related needs Referring Physician: ER, Dr. Isla Pence and Dr. Waldron Labs from [facility].  HPI: Destiny Day is an 67 y.o. female.  History of hypertension, COPD, anemia, anxiety, diabetes, CHF, ESRD on hemodialysis Monday Wednesday Friday at [facility] kidney center presented with severe shortness of breath, cough for 1 day.  Patient unable to go to dialysis today because of shortness of breath.  In the ER patient was found to have blood pressure more than 200s and hypoxia.  Chest x-ray with me and pulmonary edema.  Started on BiPAP.  Consulted for dialysis today. Patient is currently on BiPAP.  Blood pressure still elevated.  Her husband at bedside.  Patient denied headache or chest pain.    Dialyzes at [facility] EDW 64.5. HD Bath 2K 2.25 calcium, Dialyzer blocks, Heparin 1000 units every treatment. Access left upper extremity AV graft. IV Venofer 50 mg weekly IV Mircera last doe on 6/21. Hectorol 7 mcg every treatment  Past Medical History:  Diagnosis Date  . Abdominal bruit   . Anemia   . Anxiety   . Arthritis    Osteoarthritis  . Asthma   . Cervical disc disease    "pinced nerve"  . CHF (congestive heart failure) ([place])   . Complication of anesthesia    " after I got home from my last procedure, I started itching."  . Diabetes mellitus    Type II  . Diverticulitis   . ESRD on peritoneal dialysis [facility])    Hemodialysis - MWF- [place]   . GERD (gastroesophageal reflux disease)    from medications  . GI bleed 03/31/2013  . History of hiatal hernia   . Hyperlipidemia   . Hypertension   . Neuropathy    left leg  . Osteoporosis   . Peripheral vascular disease ([place])   . Pneumonia    "very young"  . Seasonal allergies   . Shortness of breath dyspnea    WIth exertion  . Sleep apnea    can't afford cpap    Past Surgical History:  Procedure Laterality Date  . A/V SHUNTOGRAM  N/A 09/22/2016   Procedure: A/V Shuntogram - left arm;  Surgeon: Serafina Mitchell, MD;  Location: [place] [facility];  Service: Cardiovascular;  Laterality: N/A;  . ABDOMINAL HYSTERECTOMY  1993`  . AMPUTATION Left 09/01/2016   Procedure: LEFT FOOT TRANSMETATARSAL AMPUTATION;  Surgeon: Newt Minion, MD;  Location: [place];  Service: Orthopedics;  Laterality: Left;  . AMPUTATION Right 08/11/2017   Procedure: RIGHT GREAT TOE AMPUTATION DIGIT;  Surgeon: Rosetta Posner, MD;  Location: [place];  Service: Vascular;  Laterality: Right;  . AMPUTATION Right 12/31/2017   Procedure: RIGHT TRANSMETATARSAL AMPUTATION;  Surgeon: Newt Minion, MD;  Location: [place];  Service: Orthopedics;  Laterality: Right;  . AV FISTULA PLACEMENT Left 04/21/2016   Procedure: INSERTION OF ARTERIOVENOUS (AV) GORE-TEX GRAFT ARM LEFT;  Surgeon: Elam Dutch, MD;  Location: [place];  Service: Vascular;  Laterality: Left;  . [place] TRANSPOSITION Left 07/10/2014   Procedure: BASCILIC VEIN TRANSPOSITION;  Surgeon: Angelia Mould, MD;  Location: [place];  Service: Vascular;  Laterality: Left;  . BASCILIC VEIN TRANSPOSITION Right 11/08/2014   Procedure: FIRST STAGE BASILIC VEIN TRANSPOSITION;  Surgeon: Angelia Mould, MD;  Location: [place];  Service: Vascular;  Laterality: Right;  . BASCILIC VEIN TRANSPOSITION Right 01/18/2015   Procedure: SECOND STAGE BASILIC VEIN TRANSPOSITION;  Surgeon: Angelia Mould, MD;  Location: [place];  Service: Vascular;  Laterality: Right;  . CRANIOTOMY N/A 08/23/2017   Procedure: CRANIOTOMY HEMATOMA EVACUATION SUBDURAL;  Surgeon: Ashok Pall, MD;  Location: [place];  Service: Neurosurgery;  Laterality: N/A;  . CRANIOTOMY Left 08/24/2017   Procedure: CRANIOTOMY FOR RECURRENT ACUTE SUBDURAL HEMATOMA;  Surgeon: Ashok Pall, MD;  Location: [place];  Service: Neurosurgery;  Laterality: Left;  . ESOPHAGOGASTRODUODENOSCOPY N/A 03/31/2013   Procedure: ESOPHAGOGASTRODUODENOSCOPY (EGD);  Surgeon: Gatha Mayer, MD;  Location: [facility] ENDOSCOPY;  Service: Endoscopy;  Laterality: N/A;  . EYE SURGERY     laser surgery  . FEMORAL-POPLITEAL BYPASS GRAFT Left 07/08/2016   Procedure: LEFT  FEMORAL-BELOW KNEE POPLITEAL ARTERY BYPASS GRAFT USING 6MM X 80 CM PROPATEN GORETEX GRAFT WITH RINGS.;  Surgeon: Rosetta Posner, MD;  Location: [place];  Service: Vascular;  Laterality: Left;  . FEMORAL-POPLITEAL BYPASS GRAFT Right 08/11/2017   Procedure: RIGHT FEMORAL TO BELOW KNEE POPLITEAL ARTERKY  BYPASS GRAFT USING 6MM RINGED PROPATEN GRAFT;  Surgeon: Rosetta Posner, MD;  Location: [place];  Service: Vascular;  Laterality: Right;  . FISTULOGRAM Left 10/29/2014   Procedure: FISTULOGRAM;  Surgeon: Angelia Mould, MD;  Location: [facility] CATH LAB;  Service: Cardiovascular;  Laterality: Left;  . LIGATION OF ARTERIOVENOUS  FISTULA Left 04/21/2016   Procedure: LIGATION OF ARTERIOVENOUS  FISTULA LEFT ARM;  Surgeon: Elam Dutch, MD;  Location: [place];  Service: Vascular;  Laterality: Left;  . LOWER EXTREMITY ANGIOGRAPHY N/A 04/06/2017   Procedure: Lower Extremity Angiography - Right;  Surgeon: Serafina Mitchell, MD;  Location: [place] [facility];  Service: Cardiovascular;  Laterality: N/A;  . PATCH ANGIOPLASTY Right 01/18/2015   Procedure: BASILIC VEIN PATCH ANGIOPLASTY USING VASCUGUARD PATCH;  Surgeon: Angelia Mould, MD;  Location: [place];  Service: Vascular;  Laterality: Right;  . PERIPHERAL VASCULAR BALLOON ANGIOPLASTY  09/22/2016   Procedure: Peripheral Vascular Balloon Angioplasty;  Surgeon: Serafina Mitchell, MD;  Location: [place] [facility];  Service: Cardiovascular;;  Lt. Fistula  . PERIPHERAL VASCULAR CATHETERIZATION N/A 06/23/2016   Procedure: Abdominal Aortogram w/Lower Extremity;  Surgeon: Serafina Mitchell, MD;  Location: [place] [facility];  Service: Cardiovascular;  Laterality: N/A;  . PERIPHERAL VASCULAR CATHETERIZATION  06/23/2016   Procedure: Peripheral Vascular Intervention;  Surgeon: Serafina Mitchell, MD;   Location: [place] [facility];  Service: Cardiovascular;;  lt common and external illiac artery    Family History  Problem Relation Age of Onset  . Other Mother        not sure of cause of death  . Diabetes Father   . Pancreatic cancer Maternal Grandmother   . Colon cancer Neg Hx     Social History:  reports that she quit smoking about 5 years ago. Her smoking use included cigarettes. She has a 14.00 pack-year smoking history. She has never used smokeless tobacco. She reports that she does not drink alcohol or use drugs.  Allergies:  Allergies  Allergen Reactions  . Lisinopril Cough  . Prednisone Swelling and Other (See Comments)    Excessive fluid buildup    Medications: I have reviewed the patient's current medications.   Results for orders placed or performed during the hospital encounter of 01/21/18 (from the past 48 hour(s))  Basic metabolic panel     Status: Abnormal   Collection Time: 01/21/18  9:42 AM  Result Value Ref Range   Sodium 138 135 - 145 mmol/L   Potassium  3.1 (L) 3.5 - 5.1 mmol/L   Chloride 96 (L) 98 - 111 mmol/L    Comment: Please note change in reference range.   CO2 30 22 - 32 mmol/L   Glucose, Bld 149 (H) 70 - 99 mg/dL    Comment: Please note change in reference range.   BUN 10 8 - 23 mg/dL    Comment: Please note change in reference range.   Creatinine, Ser 4.74 (H) 0.44 - 1.00 mg/dL   Calcium 9.3 8.9 - 10.3 mg/dL   GFR calc non Af Amer 9 (L) >60 mL/min   GFR calc Af Amer 10 (L) >60 mL/min    Comment: (NOTE) The eGFR has been calculated using the CKD EPI equation. This calculation has not been validated in all clinical situations. eGFR's persistently <60 mL/min signify possible Chronic Kidney Disease.    Anion gap 12 5 - 15    Comment: Performed at [facility] [address] [place], [place] [address]  CBC     Status: Abnormal   Collection Time: 01/21/18  9:42 AM  Result Value Ref Range   WBC 8.1 4.0 - 10.5 K/uL   RBC 3.42 (L) 3.87  - 5.11 MIL/uL   Hemoglobin 10.0 (L) 12.0 - 15.0 g/dL   HCT 33.7 (L) 36.0 - 46.0 %   MCV 98.5 78.0 - 100.0 fL   MCH 29.2 26.0 - 34.0 pg   MCHC 29.7 (L) 30.0 - 36.0 g/dL   RDW 15.0 11.5 - 15.5 %   Platelets 232 150 - 400 K/uL    Comment: Performed at [facility], [place] [address] [place],  [address]  I-stat troponin, ED     Status: None   Collection Time: 01/21/18  9:54 AM  Result Value Ref Range   Troponin i, poc 0.04 0.00 - 0.08 ng/mL   Comment 3            Comment: Due to the release kinetics of cTnI, a negative result within the first hours of the onset of symptoms does not rule out myocardial infarction with certainty. If myocardial infarction is still suspected, repeat the test at appropriate intervals.     Dg Chest 2 View  Result Date: 01/21/2018 CLINICAL DATA:  Acute cough and shortness of breath. EXAM: CHEST - 2 VIEW COMPARISON:  08/27/2017 and prior radiograph FINDINGS: Cardiomegaly with pulmonary vascular congestion and mild interstitial edema noted. There are trace bilateral pleural effusions present. No pneumothorax or acute bony abnormality. IMPRESSION: Cardiomegaly with mild interstitial pulmonary edema and trace bilateral pleural effusions. Electronically Signed   By: Margarette Canada M.D.   On: 01/21/2018 10:01    ROS: Having shortness of breath and some cough.  Denies chest pain, nausea vomiting headache or dizziness.  Review of systems limited because patient is currently on BiPAP. Blood pressure (!) 203/99, pulse 92, temperature 99 F (37.2 C), temperature source Oral, resp. rate (!) 29, SpO2 100 %. General: Lying in bed with BiPAP, mild distress with shortness of breath Eye: EOMI Respiratory: Coarse breath sound bilateral, Cardiovascular: Regular rate rhythm S1-S2 normal, no rubs GI: Soft, nontender, nondistended Extremity: No edema, Neurology: Alert, awake, following commands Left upper extremity AV graft has a good thrill and bruit.    Assessment/Plan: 1 shortness of breath and cough due to CHF exacerbation/pulmonary edema: Dialysis today.  Her echocardiogram.  Per primary team. 2 ESRD: Monday Wednesday Friday.  Plan for dialysis today because of hypertensive urgency and CHF.  4 hours,  3K, use left upper extremity AV graft, 400/800, OPTi flux, UF goal 3 to 4 kg. 3 Hypertension urgency: Resume home blood pressure medication.  Dialysis with ultrafiltration should help manage blood pressure. 4. Anemia of ESRD: Hemoglobin 10.  Received Mircera on 6/21.  Order a dose of IV iron today. 5. Metabolic Bone Disease: Calcium 9.3.  Check phosphorus level.  Continue Hectorol.  PTH 293.  Plan discussed with the patient, her husband, ER team, dialysis nurse and primary team.  Rosita Fire 01/21/2018, 5:25 PM

## 2018-01-21 NOTE — Progress Notes (Signed)
Pt trial off bipap per MD request.  Pt became more SOB and increased crackles noted- pt request back on bipap.  RN in room and aware.

## 2018-01-21 NOTE — ED Notes (Signed)
MD made aware of patient HR 140's and stating she is short of breath.  MD at bedside.

## 2018-01-21 NOTE — ED Notes (Signed)
MD aware patient IV that placed by IC team is not working. Patient does not have IV, Team ordered placed, RN called advised patient needs new IV.

## 2018-01-22 ENCOUNTER — Other Ambulatory Visit: Payer: Self-pay

## 2018-01-22 DIAGNOSIS — J431 Panlobular emphysema: Secondary | ICD-10-CM

## 2018-01-22 DIAGNOSIS — I739 Peripheral vascular disease, unspecified: Secondary | ICD-10-CM | POA: Diagnosis not present

## 2018-01-22 DIAGNOSIS — N186 End stage renal disease: Secondary | ICD-10-CM

## 2018-01-22 DIAGNOSIS — S065X9A Traumatic subdural hemorrhage with loss of consciousness of unspecified duration, initial encounter: Secondary | ICD-10-CM | POA: Diagnosis not present

## 2018-01-22 DIAGNOSIS — J9601 Acute respiratory failure with hypoxia: Secondary | ICD-10-CM | POA: Diagnosis not present

## 2018-01-22 DIAGNOSIS — Z992 Dependence on renal dialysis: Secondary | ICD-10-CM

## 2018-01-22 DIAGNOSIS — I5033 Acute on chronic diastolic (congestive) heart failure: Secondary | ICD-10-CM | POA: Diagnosis not present

## 2018-01-22 LAB — CBC
HCT: 33 % — ABNORMAL LOW (ref 36.0–46.0)
Hemoglobin: 10 g/dL — ABNORMAL LOW (ref 12.0–15.0)
MCH: 29.4 pg (ref 26.0–34.0)
MCHC: 30.3 g/dL (ref 30.0–36.0)
MCV: 97.1 fL (ref 78.0–100.0)
Platelets: 192 10*3/uL (ref 150–400)
RBC: 3.4 MIL/uL — ABNORMAL LOW (ref 3.87–5.11)
RDW: 15.1 % (ref 11.5–15.5)
WBC: 9.5 10*3/uL (ref 4.0–10.5)

## 2018-01-22 LAB — GLUCOSE, CAPILLARY
Glucose-Capillary: 138 mg/dL — ABNORMAL HIGH (ref 70–99)
Glucose-Capillary: 108 mg/dL — ABNORMAL HIGH (ref 70–99)
Glucose-Capillary: 117 mg/dL — ABNORMAL HIGH (ref 70–99)
Glucose-Capillary: 229 mg/dL — ABNORMAL HIGH (ref 70–99)

## 2018-01-22 LAB — RENAL FUNCTION PANEL
Albumin: 2.7 g/dL — ABNORMAL LOW (ref 3.5–5.0)
Anion gap: 12 (ref 5–15)
BUN: <5 mg/dL — ABNORMAL LOW (ref 8–23)
CO2: 28 mmol/L (ref 22–32)
Calcium: 8.8 mg/dL — ABNORMAL LOW (ref 8.9–10.3)
Chloride: 96 mmol/L — ABNORMAL LOW (ref 98–111)
Creatinine, Ser: 2.91 mg/dL — ABNORMAL HIGH (ref 0.44–1.00)
GFR calc Af Amer: 18 mL/min — ABNORMAL LOW (ref >60)
GFR calc non Af Amer: 16 mL/min — ABNORMAL LOW (ref >60)
Glucose, Bld: 279 mg/dL — ABNORMAL HIGH (ref 70–99)
Phosphorus: 2.3 mg/dL — ABNORMAL LOW (ref 2.5–4.6)
Potassium: 3 mmol/L — ABNORMAL LOW (ref 3.5–5.1)
Sodium: 136 mmol/L (ref 135–145)

## 2018-01-22 LAB — HIV ANTIBODY (ROUTINE TESTING W REFLEX): HIV Screen 4th Generation wRfx: NONREACTIVE

## 2018-01-22 LAB — MRSA PCR SCREENING: MRSA by PCR: NEGATIVE

## 2018-01-22 MED ORDER — PENTAFLUOROPROP-TETRAFLUOROETH EX AERO: 1.0000 "application " | INHALATION_SPRAY | CUTANEOUS | Status: DC | PRN

## 2018-01-22 MED ORDER — IPRATROPIUM-ALBUTEROL 0.5-2.5 (3) MG/3ML IN SOLN
3.0000 mL | Freq: Three times a day (TID) | RESPIRATORY_TRACT | Status: DC
  Administered 2018-01-22 – 2018-01-23 (×2): 3 mL via RESPIRATORY_TRACT
  Filled 2018-01-22 (×2): qty 3

## 2018-01-22 MED ORDER — PENTAFLUOROPROP-TETRAFLUOROETH EX AERO
INHALATION_SPRAY | CUTANEOUS | Status: AC
  Filled 2018-01-22: qty 207

## 2018-01-22 MED ORDER — HEPARIN SODIUM (PORCINE) 1000 UNIT/ML DIALYSIS
1000.0000 [IU] | Freq: Once | INTRAMUSCULAR | Status: DC
  Filled 2018-01-22: qty 1

## 2018-01-22 MED ORDER — SODIUM CHLORIDE 0.9 % IV SOLN: 100.0000 mL | INTRAVENOUS | Status: DC | PRN

## 2018-01-22 MED ORDER — LIDOCAINE-PRILOCAINE 2.5-2.5 % EX CREA
1.0000 "application " | TOPICAL_CREAM | CUTANEOUS | Status: DC | PRN
  Filled 2018-01-22: qty 5

## 2018-01-22 MED ORDER — POTASSIUM CHLORIDE CRYS ER 20 MEQ PO TBCR
20.0000 meq | EXTENDED_RELEASE_TABLET | Freq: Once | ORAL | Status: DC
  Filled 2018-01-22: qty 1

## 2018-01-22 MED ORDER — CHLORHEXIDINE GLUCONATE CLOTH 2 % EX PADS
6.0000 | MEDICATED_PAD | Freq: Every day | CUTANEOUS | Status: DC
  Administered 2018-01-23: 6 via TOPICAL

## 2018-01-22 NOTE — Progress Notes (Signed)
PROGRESS NOTE    Destiny Day  TMH:[id number] DOB: 1951-02-06 DOA: 01/21/2018 PCP: Velna Hatchet, MD   Brief Narrative:  67 y.o. BF, PMHx ESRD on HD M/W/F, HTN, diabetes type 2 uncontrolled with complication, chronic diastolic CHF, HLD PVD, S/P bilateral transmetatarsal amputation.  Bleed,, GI bleed, GERD, Diverticulitis, Anemia of chronic disease, S/P intracranial hemorrhage secondary to fall S/P Craniectomy, recent RIGHT foot Transtarsal amputation by Dr. Sharol Given on 12/31/2017,   Complaints of shortness of breath, she is compliant with her hemodialysis Monday Wednesday Friday, but reports yesterday evening she did develop some dyspnea, no cough, no fever, no chills, no chest pain, this morning she was significantly uncomfortable, so she did choose to come to ED instead of her dialysis, she was increased work of breathing where she required BiPAP, did have uncontrolled blood pressure in ED, as she did not take any of her home medication yet, x-ray significant for mild interstitial pulmonary edema, with cardiomegaly and trace bilateral pleural effusion, blood pressure was significantly elevated at 209/122, as well she was noted to be tachycardic in the 140s, appears to be sinus tachycardia which improved after she received IV Cardizem ,renal were consulted who requested admission as she will receive hemodialysis today.  I was called to admit.      Subjective: 6/29 A/O x4, negative S OB, negative CP, negative abdominal pain.  States does not miss her HD sessions.  Became acutely SOB w/ chest tightness.  Husband brought her to ED. where she has received 2 HD sessions.  Now respiratory status approaching normal.  States CHF however has not had issues in 2 years.  Weigh yourself daily (although unable to tell me what her dry weight).    Assessment & Plan:   Active Problems:   Asthma   GERD (gastroesophageal reflux disease)   ESRD on dialysis ([place])   Diabetes mellitus ([place])   Anxiety state  Benign essential HTN   Anemia of chronic disease   Diabetic peripheral neuropathy (H[facility])   Chronic obstructive pulmonary disease (H[facility])   Respiratory failure ([facility])  Acute Respiratory failure with Hypoxia/COPD -Multifactorial hypertensive urgency, acute on chronic diastolic CHF?,  COPD,  - Currently on room air after HD x2   Acute on chronic diastolic CHF - Volume management per HD -Strict in and out -Daily weight -Transfuse for hemoglobin<8 - Coreg 12.5 mg BID -Clonidine 0.2 mg TID -Hydralazine PRN  Tachycardia - Resolved  S/P RIGHT transmetatarsal amputation -Performed by Dr. Sharol Given wound looks clean     ESRD on  HD M/W/F, -HD per nephrology   Diabetes type 2 controlled with complication -6/7 Hemoglobin A1c= 6/4 -Continue with home dose NPH, will add low scale insulin sliding scale -January gabapentin for diabetic neuropathy     PVD -Continue with aspirin and Plavix   GERD -Continue with PPI   Hyperlipidemia -Continue with home dose statin   Anxiety -With home dose Valium   Anemia of chronic disease -At baseline   Subdural hematoma -Denies any new neurological findings  Hypokalemia - Potassium goal> 4 -K-Dur 20 mEq    DVT prophylaxis: SCD Code Status: Full Family Communication: None Disposition Plan: Discharge 6/30?   Consultants:  Nephrology     Procedures/Significant Events:  Echocardiogram pending   I have personally reviewed and interpreted all radiology studies and my findings are as above.  VENTILATOR SETTINGS:    Cultures   Antimicrobials:    Devices    LINES / TUBES:      Continuous Infusions: .  sodium chloride    . sodium chloride       Objective: Vitals:   01/22/18 0733 01/22/18 0752 01/22/18 0753 01/22/18 0822  BP: (!) 124/53     Pulse:    87  Resp: 19     Temp: 98.2 F (36.8 C)     TempSrc: Oral     SpO2: 98% 97% 97%   Weight:      Height:        Intake/Output Summary (Last 24 hours) at  01/22/2018 0949 Last data filed at 01/22/2018 0000 Gross per 24 hour  Intake 371.25 ml  Output 3000 ml  Net -2628.75 ml   Filed Weights   01/21/18 2351  Weight: 139 lb 5.3 oz (63.2 kg)    Examination:  General: A/O x4 No acute respiratory distress ENT: Poor dentation  Neck:  Negative scars, masses, torticollis, lymphadenopathy, JVD Lungs: Clear to auscultation bilaterally without wheezes or crackles Cardiovascular: Regular rate and rhythm without murmur gallop or rub normal S1 and S2 Abdomen: negative abdominal pain, nondistended, positive soft, bowel sounds, no rebound, no ascites, no appreciable mass Extremities: No significant cyanosis, clubbing, bilateral lower extremity transmetatarsal amputation skin: Negative rashes, lesions, ulcers Psychiatric:  Negative depression, negative anxiety, negative fatigue, negative mania  Central nervous system:  Cranial nerves II through XII intact, tongue/uvula midline, all extremities muscle strength 5/5, sensation intact throughout, negative dysarthria, negative expressive aphasia, negative receptive aphasia.  .     Data Reviewed: Care during the described time interval was provided by me .  I have reviewed this patient's available data, including medical history, events of note, physical examination, and all test results as part of my evaluation.   CBC: Recent Labs  Lab 01/21/18 0942 01/22/18 0247  WBC 8.1 9.5  HGB 10.0* 10.0*  HCT 33.7* 33.0*  MCV 98.5 97.1  PLT 232 381   Basic Metabolic Panel: Recent Labs  Lab 01/21/18 0942 01/22/18 0247  NA 138 136  K 3.1* 3.0*  CL 96* 96*  CO2 30 28  GLUCOSE 149* 279*  BUN 10 <5*  CREATININE 4.74* 2.91*  CALCIUM 9.3 8.8*  PHOS  --  2.3*   GFR: Estimated Creatinine Clearance: 17.1 mL/min (A) (by C-G formula based on SCr of 2.91 mg/dL (H)). Liver Function Tests: Recent Labs  Lab 01/22/18 0247  ALBUMIN 2.7*   No results for input(s): LIPASE, AMYLASE in the last 168 hours. No  results for input(s): AMMONIA in the last 168 hours. Coagulation Profile: No results for input(s): INR, PROTIME in the last 168 hours. Cardiac Enzymes: No results for input(s): CKTOTAL, CKMB, CKMBINDEX, TROPONINI in the last 168 hours. BNP (last 3 results) No results for input(s): PROBNP in the last 8760 hours. HbA1C: No results for input(s): HGBA1C in the last 72 hours. CBG: Recent Labs  Lab 01/21/18 2348 01/22/18 0731  GLUCAP 150* 138*   Lipid Profile: No results for input(s): CHOL, HDL, LDLCALC, TRIG, CHOLHDL, LDLDIRECT in the last 72 hours. Thyroid Function Tests: No results for input(s): TSH, T4TOTAL, FREET4, T3FREE, THYROIDAB in the last 72 hours. Anemia Panel: No results for input(s): VITAMINB12, FOLATE, FERRITIN, TIBC, IRON, RETICCTPCT in the last 72 hours. Urine analysis:    Component Value Date/Time   COLORURINE YELLOW 07/07/2016 0914   APPEARANCEUR CLOUDY (A) 07/07/2016 0914   LABSPEC >1.030 (H) 07/07/2016 0914   PHURINE 5.5 07/07/2016 0914   GLUCOSEU NEGATIVE 07/07/2016 0914   HGBUR SMALL (A) 07/07/2016 0914   BILIRUBINUR SMALL (A) 07/07/2016 0914  KETONESUR 15 (A) 07/07/2016 0914   PROTEINUR >300 (A) 07/07/2016 0914   UROBILINOGEN 0.2 07/30/2014 2000   NITRITE NEGATIVE 07/07/2016 0914   LEUKOCYTESUR SMALL (A) 07/07/2016 0914   Sepsis Labs: @LABRCNTIP (procalcitonin:4,lacticidven:4)  ) Recent Results (from the past 240 hour(s))  MRSA PCR Screening     Status: None   Collection Time: 01/21/18 11:30 PM  Result Value Ref Range Status   MRSA by PCR NEGATIVE NEGATIVE Final    Comment:        The GeneXpert MRSA Assay (FDA approved for NASAL specimens only), is one component of a comprehensive MRSA colonization surveillance program. It is not intended to diagnose MRSA infection nor to guide or monitor treatment for MRSA infections. Performed at [facility], [place] [address] [place], [place] [address]          Radiology Studies: Dg Chest 2  View  Result Date: 01/21/2018 CLINICAL DATA:  Acute cough and shortness of breath. EXAM: CHEST - 2 VIEW COMPARISON:  08/27/2017 and prior radiograph FINDINGS: Cardiomegaly with pulmonary vascular congestion and mild interstitial edema noted. There are trace bilateral pleural effusions present. No pneumothorax or acute bony abnormality. IMPRESSION: Cardiomegaly with mild interstitial pulmonary edema and trace bilateral pleural effusions. Electronically Signed   By: Margarette Canada M.D.   On: 01/21/2018 10:01        Scheduled Meds: . aspirin EC  81 mg Oral Daily  . atorvastatin  20 mg Oral QHS  . carvedilol  12.5 mg Oral BID WC  . Chlorhexidine Gluconate Cloth  6 each Topical Q0600  . cloNIDine  0.2 mg Oral TID  . clopidogrel  75 mg Oral Daily  . [START ON 01/24/2018] doxercalciferol  4 mcg Intravenous Q M,W,F-HD  . doxycycline  100 mg Oral BID  . gabapentin  100 mg Oral QHS  . hydrocerin  092 application Topical BID  . insulin aspart  0-9 Units Subcutaneous TID WC  . insulin NPH Human  10 Units Subcutaneous QHS  . ipratropium-albuterol  3 mL Nebulization Q4H  . mometasone-formoterol  2 puff Inhalation BID  . multivitamin  1 tablet Oral Daily  . pantoprazole  40 mg Oral Daily  . polyethylene glycol  17 g Oral Daily   Continuous Infusions: . sodium chloride    . sodium chloride       LOS: 0 days    Time spent: 40 minutes    Albirda Shiel, Geraldo Docker, MD [facility] Pager [phone]   If 7PM-7AM, please contact night-coverage [email or web address] Password [facility] 01/22/2018, 9:49 AM

## 2018-01-22 NOTE — Progress Notes (Addendum)
Subjective:  Sleeping tolerated 3 l hd last night / sleeping in room awoken no cos  Objective Vital signs in last 24 hours: Vitals:   01/22/18 0733 01/22/18 0752 01/22/18 0753 01/22/18 0822  BP: (!) 124/53     Pulse:    87  Resp: 19     Temp: 98.2 F (36.8 C)     TempSrc: Oral     SpO2: 98% 97% 97%   Weight:      Height:       Weight change:   Physical Exam: General: alert NAD Heart: RRR no rmg Lungs: faint rales bila Abdomen: soft ,Nt, ND Extremities: no pedal edema  Dialysis Access: pos bruit  LUA AVG  Dialyzes at [facility] EDW 64.5. HD Bath 2K 2.25 calcium, Dialyzer blocks, Heparin 1000 units every treatment. Access left upper extremity AV graft. IV Venofer 50 mg weekly IV Mircera last doe on 6/21. Hectorol 7 mcg every treatment  Problem/Plan: 1. Vol overload / pul edema- SOB resolved with lower UF  On HD /needs lower edw/ bp improved with vol uf and meds / probable wt loss sp R Transmet amp. , will do extra hd today to lower edw / may need to taper meds down  2. ESRD - HD MWF  3. Anemia - hgb stable no esa [facility]  till next week  4. Secondary hyperparathyroidism - phos 2.3  Hold binder/ fu ca/phos labs / iv hec on hd  5.  copd- meds  No wheezing  6. Sp R Transmet amp - stable    Ernest Haber, PA-C [place] [phone] 01/22/2018,10:27 AM  LOS: 0 days   Pt seen, examined and agree w A/P as above. ESRD pt w/ pulm edema, losing body wt and needs edw lowered.  HD again this afternoon, should be oK for dc home after HD this afternoon.  Kelly Splinter MD [facility] pager [phone]   01/22/2018, 10:39 AM    Labs: Basic Metabolic Panel: Recent Labs  Lab 01/21/18 0942 01/22/18 0247  NA 138 136  K 3.1* 3.0*  CL 96* 96*  CO2 30 28  GLUCOSE 149* 279*  BUN 10 <5*  CREATININE 4.74* 2.91*  CALCIUM 9.3 8.8*  PHOS  --  2.3*   Liver Function Tests: Recent Labs  Lab 01/22/18 0247  ALBUMIN  2.7*   No results for input(s): LIPASE, AMYLASE in the last 168 hours. No results for input(s): AMMONIA in the last 168 hours. CBC: Recent Labs  Lab 01/21/18 0942 01/22/18 0247  WBC 8.1 9.5  HGB 10.0* 10.0*  HCT 33.7* 33.0*  MCV 98.5 97.1  PLT 232 192   Cardiac Enzymes: No results for input(s): CKTOTAL, CKMB, CKMBINDEX, TROPONINI in the last 168 hours. CBG: Recent Labs  Lab 01/21/18 2348 01/22/18 0731  GLUCAP 150* 138*    Studies/Results: Dg Chest 2 View  Result Date: 01/21/2018 CLINICAL DATA:  Acute cough and shortness of breath. EXAM: CHEST - 2 VIEW COMPARISON:  08/27/2017 and prior radiograph FINDINGS: Cardiomegaly with pulmonary vascular congestion and mild interstitial edema noted. There are trace bilateral pleural effusions present. No pneumothorax or acute bony abnormality. IMPRESSION: Cardiomegaly with mild interstitial pulmonary edema and trace bilateral pleural effusions. Electronically Signed   By: Margarette Canada M.D.   On: 01/21/2018 10:01   Medications: . sodium chloride    . sodium chloride     . aspirin EC  81 mg Oral Daily  . atorvastatin  20  mg Oral QHS  . carvedilol  12.5 mg Oral BID WC  . Chlorhexidine Gluconate Cloth  6 each Topical Q0600  . cloNIDine  0.2 mg Oral TID  . clopidogrel  75 mg Oral Daily  . [START ON 01/24/2018] doxercalciferol  4 mcg Intravenous Q M,W,F-HD  . doxycycline  100 mg Oral BID  . gabapentin  100 mg Oral QHS  . hydrocerin  437 application Topical BID  . insulin aspart  0-9 Units Subcutaneous TID WC  . insulin NPH Human  10 Units Subcutaneous QHS  . ipratropium-albuterol  3 mL Nebulization Q4H  . mometasone-formoterol  2 puff Inhalation BID  . multivitamin  1 tablet Oral Daily  . pantoprazole  40 mg Oral Daily  . polyethylene glycol  17 g Oral Daily  . potassium chloride  20 mEq Oral Once

## 2018-01-22 NOTE — Progress Notes (Signed)
Patient resting comfortably on room air. No respiratory distress noted. BIPAP is not needed at this time. RT will monitor as needed.

## 2018-01-23 ENCOUNTER — Observation Stay (HOSPITAL_BASED_OUTPATIENT_CLINIC_OR_DEPARTMENT_OTHER): Payer: Medicare Other

## 2018-01-23 ENCOUNTER — Observation Stay (HOSPITAL_COMMUNITY): Payer: Medicare Other

## 2018-01-23 DIAGNOSIS — E7849 Other hyperlipidemia: Secondary | ICD-10-CM | POA: Diagnosis not present

## 2018-01-23 DIAGNOSIS — I361 Nonrheumatic tricuspid (valve) insufficiency: Secondary | ICD-10-CM | POA: Diagnosis not present

## 2018-01-23 DIAGNOSIS — I5032 Chronic diastolic (congestive) heart failure: Secondary | ICD-10-CM

## 2018-01-23 DIAGNOSIS — N186 End stage renal disease: Secondary | ICD-10-CM | POA: Diagnosis not present

## 2018-01-23 DIAGNOSIS — Z992 Dependence on renal dialysis: Secondary | ICD-10-CM

## 2018-01-23 DIAGNOSIS — S065X9A Traumatic subdural hemorrhage with loss of consciousness of unspecified duration, initial encounter: Secondary | ICD-10-CM | POA: Diagnosis not present

## 2018-01-23 DIAGNOSIS — I509 Heart failure, unspecified: Secondary | ICD-10-CM

## 2018-01-23 DIAGNOSIS — D638 Anemia in other chronic diseases classified elsewhere: Secondary | ICD-10-CM | POA: Diagnosis not present

## 2018-01-23 DIAGNOSIS — J9601 Acute respiratory failure with hypoxia: Secondary | ICD-10-CM | POA: Diagnosis not present

## 2018-01-23 DIAGNOSIS — E1142 Type 2 diabetes mellitus with diabetic polyneuropathy: Secondary | ICD-10-CM

## 2018-01-23 DIAGNOSIS — J431 Panlobular emphysema: Secondary | ICD-10-CM | POA: Diagnosis not present

## 2018-01-23 LAB — BASIC METABOLIC PANEL
Anion gap: 9 (ref 5–15)
BUN: 5 mg/dL — ABNORMAL LOW (ref 8–23)
CO2: 28 mmol/L (ref 22–32)
Calcium: 9.1 mg/dL (ref 8.9–10.3)
Chloride: 99 mmol/L (ref 98–111)
Creatinine, Ser: 2.76 mg/dL — ABNORMAL HIGH (ref 0.44–1.00)
GFR calc Af Amer: 19 mL/min — ABNORMAL LOW (ref >60)
GFR calc non Af Amer: 17 mL/min — ABNORMAL LOW (ref >60)
Glucose, Bld: 146 mg/dL — ABNORMAL HIGH (ref 70–99)
Potassium: 3.9 mmol/L (ref 3.5–5.1)
Sodium: 136 mmol/L (ref 135–145)

## 2018-01-23 LAB — CBC
HCT: 33.7 % — ABNORMAL LOW (ref 36.0–46.0)
Hemoglobin: 10 g/dL — ABNORMAL LOW (ref 12.0–15.0)
MCH: 29.4 pg (ref 26.0–34.0)
MCHC: 29.7 g/dL — ABNORMAL LOW (ref 30.0–36.0)
MCV: 99.1 fL (ref 78.0–100.0)
Platelets: 179 10*3/uL (ref 150–400)
RBC: 3.4 MIL/uL — ABNORMAL LOW (ref 3.87–5.11)
RDW: 15.7 % — ABNORMAL HIGH (ref 11.5–15.5)
WBC: 7.5 10*3/uL (ref 4.0–10.5)

## 2018-01-23 LAB — ECHOCARDIOGRAM COMPLETE
Height: 65 in
Weight: 2261.04 oz

## 2018-01-23 LAB — GLUCOSE, CAPILLARY
Glucose-Capillary: 72 mg/dL (ref 70–99)
Glucose-Capillary: 263 mg/dL — ABNORMAL HIGH (ref 70–99)

## 2018-01-23 LAB — MAGNESIUM: Magnesium: 1.9 mg/dL (ref 1.7–2.4)

## 2018-01-23 MED ORDER — INSULIN NPH (HUMAN) (ISOPHANE) 100 UNIT/ML ~~LOC~~ SUSP: 10.0000 [IU] | Freq: Every day | SUBCUTANEOUS | 0 refills | Status: DC

## 2018-01-23 MED ORDER — IPRATROPIUM-ALBUTEROL 0.5-2.5 (3) MG/3ML IN SOLN: 3.0000 mL | Freq: Four times a day (QID) | RESPIRATORY_TRACT | Status: DC | PRN

## 2018-01-23 MED ORDER — ALBUTEROL SULFATE HFA 108 (90 BASE) MCG/ACT IN AERS: 1.0000 | INHALATION_SPRAY | Freq: Four times a day (QID) | RESPIRATORY_TRACT | 0 refills | Status: DC | PRN

## 2018-01-23 MED ORDER — LIDOCAINE HCL (PF) 1 % IJ SOLN: 5.0000 mL | INTRAMUSCULAR | 0 refills | Status: DC | PRN

## 2018-01-23 NOTE — Discharge Summary (Signed)
Physician Discharge Summary  Destiny Day CZY:[id number] DOB: [birth year] DOA: 01/21/2018  PCP: Velna Hatchet, MD  Admit date: 01/21/2018 Discharge date: 01/23/2018  Time spent: 35 minutes  Recommendations for Outpatient Follow-up:   Acute Respiratory failure with Hypoxia/COPD -Multifactorial hypertensive urgency, acute on chronic diastolic CHF?,  COPD,  - Currently on room air after HD x2     Chronic Diastolic CHF -When compared with echocardiogram from 06/14/2015 no significant increase in diastolic heart failure.  However patient has developed pulmonary HTN in the interim - Volume management per HD -Strict in and out since admission -4.1 L  -Daily weight Filed Weights   01/21/18 2351 01/22/18 1330 01/22/18 1715  Weight: 139 lb 5.3 oz (63.2 kg) 146 lb 9.7 oz (66.5 kg) 141 lb 5 oz (64.1 kg)  -Transfuse for hemoglobin<8 - Coreg 12.5 mg BID -Clonidine 0.2 mg TID -Hydralazine PRN   Tachycardia - Resolved   S/P RIGHT transmetatarsal amputation -Performed by Dr. Sharol Given wound looks clean      ESRD on  HD M/W/F, -HD per nephrology - Discussed case with  Dr. Roney Jaffe nephrology and he would like to pull additional 2 L of fluid off patient however patient insisted on discharge today so will make fluid adjustments, dry weight adjustments at outpatient HD on 7/1    Diabetes type 2 controlled with complication -6/7 Hemoglobin A1c= 6.4 -Continue with home dose NPH, will add low scale insulin sliding scale -Home gabapentin for diabetic neuropathy   PVD -Continue with aspirin and Plavix   GERD -Continue with PPI   Hyperlipidemia -Continue with home dose statin   Anxiety -With home dose Valium   Anemia of chronic disease -At baseline   Subdural hematoma -Denies any new neurological findings   Hypokalemia - Potassium goal> 4 -K-Dur 20 mEq     Discharge Diagnoses:  Active Problems:   Asthma   GERD (gastroesophageal reflux disease)   ESRD on dialysis  (HCC)   Diabetes mellitus ([place])   Anxiety state   Benign essential HTN   Anemia of chronic disease   Diabetic peripheral neuropathy (HCC)   Chronic obstructive pulmonary disease (HCC)   Respiratory failure ([place])   Discharge Condition: Stable  Diet recommendation: Renal fluid restriction 1279ml/dy  Filed Weights   01/21/18 2351 01/22/18 1330 01/22/18 1715  Weight: 139 lb 5.3 oz (63.2 kg) 146 lb 9.7 oz (66.5 kg) 141 lb 5 oz (64.1 kg)    History of present illness:  67 y.o. BF, PMHx ESRD on HD M/W/F, HTN, diabetes type 2 uncontrolled with complication, chronic diastolic CHF, HLD PVD, S/P bilateral transmetatarsal amputation.  Bleed,, GI bleed, GERD, Diverticulitis, Anemia of chronic disease, S/P intracranial hemorrhage secondary to fall S/P Craniectomy, recent RIGHT foot Transtarsal amputation by Dr. Sharol Given on 12/31/2017,    Complaints of shortness of breath, she is compliant with her hemodialysis Monday Wednesday Friday, but reports yesterday evening she did develop some dyspnea, no cough, no fever, no chills, no chest pain, this morning she was significantly uncomfortable, so she did choose to come to ED instead of her dialysis, she was increased work of breathing where she required BiPAP, did have uncontrolled blood pressure in ED, as she did not take any of her home medication yet, x-ray significant for mild interstitial pulmonary edema, with cardiomegaly and trace bilateral pleural effusion, blood pressure was significantly elevated at 209/122, as well she was noted to be tachycardic in the 140s, appears to be sinus tachycardia which improved after she received IV Cardizem ,  renal were consulted who requested admission as she will receive hemodialysis today.  I was called to admit. After admission received multiple episodes of HD with resolution of S OB/CP.  Patient refused to stay for additional HD treatment per nephrology recommendation and insisted upon discharge.  Nephrology agreed to allow  her to be discharged will make additional adjustments to dry weight at outpatient HD.    Procedures: 6/30 echocardiogram:Left ventricle: severe LVH.-LVEF = 55%- 60%.  -Grade 2 diastolic dysfunction.  -- Pulmonary arteries:  PA  peak pressure: 44 mm Hg (S).   Consultations: Nephrology    Discharge Exam: Vitals:   01/23/18 0714 01/23/18 0733 01/23/18 1048 01/23/18 1202  BP:  (!) 162/66 131/67 (!) 156/67  Pulse:  74 76 73  Resp:  17  15  Temp:  97.8 F (36.6 C)  98.1 F (36.7 C)  TempSrc:  Oral  Oral  SpO2: 100% 100%  100%  Weight:      Height:        General: A/O x4 No acute respiratory distress ENT: Poor dentation  Neck:  Negative scars, masses, torticollis, lymphadenopathy, JVD Lungs: Clear to auscultation bilaterally without wheezes or crackles Cardiovascular: Regular rate and rhythm without murmur gallop or rub normal S1 and S2  Discharge Instructions   Allergies as of 01/23/2018      Reactions   Prednisone Swelling, Other (See Comments)   Excessive fluid buildup   Lisinopril Cough      Medication List    TAKE these medications   albuterol 108 (90 Base) MCG/ACT inhaler Commonly known as:  PROVENTIL HFA;VENTOLIN HFA Inhale 1-2 puffs into the lungs every 6 (six) hours as needed for wheezing or shortness of breath.   aspirin EC 81 MG tablet Take 81 mg by mouth daily.   atorvastatin 20 MG tablet Commonly known as:  LIPITOR Take 20 mg by mouth at bedtime.   budesonide-formoterol 160-4.5 MCG/ACT inhaler Commonly known as:  SYMBICORT Inhale 1 puff into the lungs daily as needed (for respiratory issues).   carvedilol 12.5 MG tablet Commonly known as:  COREG Take 12.5 mg by mouth 2 (two) times daily with a meal.   cinacalcet 60 MG tablet Commonly known as:  SENSIPAR Take 60 mg by mouth daily. Pt. Gets at dialysis   cloNIDine 0.2 MG tablet Commonly known as:  CATAPRES Take 1 tablet (0.2 mg total) by mouth 3 (three) times daily.   clopidogrel 75 MG  tablet Commonly known as:  PLAVIX Take 75 mg by mouth daily.   diazepam 5 MG tablet Commonly known as:  VALIUM Take 5 mg by mouth 2 (two) times daily as needed for anxiety.   doxycycline 100 MG tablet Commonly known as:  VIBRA-TABS Take 1 tablet (100 mg total) by mouth 2 (two) times daily.   feeding supplement (PRO-STAT SUGAR FREE 64) Liqd Take 30 mLs by mouth 2 (two) times daily.   fluticasone 50 MCG/ACT nasal spray Commonly known as:  FLONASE Place 1 spray into both nostrils daily as needed for allergies or rhinitis.   gabapentin 100 MG capsule Commonly known as:  NEURONTIN TAKE 1 CAPSULE (100 MG TOTAL) BY MOUTH AT BEDTIME. WHEN NECESSARY FOR NEUROPATHY PAIN   hydrALAZINE 25 MG tablet Commonly known as:  APRESOLINE Take 25 mg by mouth daily. What changed:  Another medication with the same name was removed. Continue taking this medication, and follow the directions you see here.   hydrocerin Crea Apply 272 application topically 2 (two) times daily.  What changed:  how much to take   insulin NPH Human 100 UNIT/ML injection Commonly known as:  HUMULIN N,NOVOLIN N Inject 0.1 mLs (10 Units total) into the skin at bedtime. What changed:    how much to take  additional instructions   insulin regular 100 units/mL injection Commonly known as:  NOVOLIN R,HUMULIN R Inject 3-8 Units into the skin 3 (three) times daily before meals. Per sliding scale on non-dialysis days   LEVEMIR FLEXTOUCH 100 UNIT/ML Pen Generic drug:  Insulin Detemir Inject 7 Units into the skin at bedtime.   lidocaine (PF) 1 % Soln injection Commonly known as:  XYLOCAINE Inject 5 mLs into the skin as needed (topical anesthesia for hemodialysis ifGEBAUERS is ineffective.).   lidocaine-prilocaine cream Commonly known as:  EMLA Apply 1 application topically 3 (three) times a week. 1-2 hours prior to dialysis   loratadine 10 MG tablet Commonly known as:  CLARITIN Take 10 mg by mouth daily as needed for  allergies.   multivitamin Tabs tablet Take 1 tablet by mouth daily.   omeprazole 20 MG capsule Commonly known as:  PRILOSEC Take 20 mg by mouth daily.   oxyCODONE-acetaminophen 5-325 MG tablet Commonly known as:  PERCOCET/ROXICET Take 1 tablet by mouth every 4 (four) hours as needed for severe pain.   polyethylene glycol packet Commonly known as:  MIRALAX / GLYCOLAX Take 17 g by mouth daily. What changed:    when to take this  reasons to take this   sodium hypochlorite external solution Apply topically daily. Cleanse groin and cover with damp gauze soaked in dakins.      Allergies  Allergen Reactions  . Prednisone Swelling and Other (See Comments)    Excessive fluid buildup  . Lisinopril Cough      The results of significant diagnostics from this hospitalization (including imaging, microbiology, ancillary and laboratory) are listed below for reference.    Significant Diagnostic Studies: Dg Chest 2 View  Result Date: 01/21/2018 CLINICAL DATA:  Acute cough and shortness of breath. EXAM: CHEST - 2 VIEW COMPARISON:  08/27/2017 and prior radiograph FINDINGS: Cardiomegaly with pulmonary vascular congestion and mild interstitial edema noted. There are trace bilateral pleural effusions present. No pneumothorax or acute bony abnormality. IMPRESSION: Cardiomegaly with mild interstitial pulmonary edema and trace bilateral pleural effusions. Electronically Signed   By: Margarette Canada M.D.   On: 01/21/2018 10:01    Microbiology: Recent Results (from the past 240 hour(s))  MRSA PCR Screening     Status: None   Collection Time: 01/21/18 11:30 PM  Result Value Ref Range Status   MRSA by PCR NEGATIVE NEGATIVE Final    Comment:        The GeneXpert MRSA Assay (FDA approved for NASAL specimens only), is one component of a comprehensive MRSA colonization surveillance program. It is not intended to diagnose MRSA infection nor to guide or monitor treatment for MRSA  infections. Performed at [facility], [place] [address], [place], [place] [address]      Labs: Basic Metabolic Panel: Recent Labs  Lab 01/21/18 0942 01/22/18 0247 01/23/18 0850  NA 138 136 136  K 3.1* 3.0* 3.9  CL 96* 96* 99  CO2 30 28 28   GLUCOSE 149* 279* 146*  BUN 10 <5* 5*  CREATININE 4.74* 2.91* 2.76*  CALCIUM 9.3 8.8* 9.1  MG  --   --  1.9  PHOS  --  2.3*  --    Liver Function Tests: Recent Labs  Lab 01/22/18  0247  ALBUMIN 2.7*   No results for input(s): LIPASE, AMYLASE in the last 168 hours. No results for input(s): AMMONIA in the last 168 hours. CBC: Recent Labs  Lab 01/21/18 0942 01/22/18 0247 01/23/18 0850  WBC 8.1 9.5 7.5  HGB 10.0* 10.0* 10.0*  HCT 33.7* 33.0* 33.7*  MCV 98.5 97.1 99.1  PLT 232 192 179   Cardiac Enzymes: No results for input(s): CKTOTAL, CKMB, CKMBINDEX, TROPONINI in the last 168 hours. BNP: BNP (last 3 results) No results for input(s): BNP in the last 8760 hours.  ProBNP (last 3 results) No results for input(s): PROBNP in the last 8760 hours.  CBG: Recent Labs  Lab 01/22/18 1213 01/22/18 1745 01/22/18 2143 01/23/18 0731 01/23/18 1159  GLUCAP 108* 117* 229* 72 263*       Signed:  Dia Crawford, MD [facility] [phone] pager

## 2018-01-23 NOTE — Progress Notes (Signed)
  Echocardiogram 2D Echocardiogram has been performed.  Lucion Dilger T Swannie Milius 01/23/2018, 12:02 PM

## 2018-01-23 NOTE — Progress Notes (Addendum)
Subjective:  No co, tolerated extra HD yest with 1658  Ml uf  Post HD bed wt  64.1 and  bp 157/61 post / currently 2 decho in process /wants to go home   Objective Vital signs in last 24 hours: Vitals:   01/23/18 0714 01/23/18 0733 01/23/18 1048 01/23/18 1202  BP:  (!) 162/66 131/67 (!) 156/67  Pulse:  74 76 73  Resp:  17  15  Temp:  97.8 F (36.6 C)  98.1 F (36.7 C)  TempSrc:  Oral  Oral  SpO2: 100% 100%  100%  Weight:      Height:       Weight change: 3.3 kg (7 lb 4.4 oz)  Physical Exam: General: alert NAD Heart: RRR no rmg Lungs: CTA  Abdomen: soft ,Nt, ND Extremities: no pedal edema  Dialysis Access: pos bruit  LUA AVG   [facility] Bath2K 2.25 calcium, Dialyzerblocks, Heparin1000 units every treatment. Accessleft upper extremity AV graft. IV Venofer 50 mg weekly IVMircera last doe on 6/21. Hectorol 7 mcg every treatment  Problem/Plan: 1. Vol overload / pul edema- SOB resolved with 2 consec HD . Lower edw to 62.5 op and fu wts and bp op unit/ ok per renal standpoint dc  pending 2d echo./ bp improved with vol uf and meds / probable wt loss sp R Transmet amp./ may need to taper meds down as op   2. ESRD - HD MWF  3. Anemia - hgb stable no esa needs given 01/19/18 at [facility]  till next week  4. Secondary hyperparathyroidism - phos 2.3  Hold binder/ fu ca/phos labs / iv hec on hd  5.  copd- meds  No wheezing  6. Sp R Transmet amp - stable   Ernest Haber, PA-C [place] 01/23/2018,12:20 PM  LOS: 0 days   Pt seen, examined and agree w A/P as above.  Doing better, at dry wt now, will lower dry wt in OP setting starting tomorrow.   Kelly Splinter MD [facility] pager [phone]   01/23/2018, 12:48 PM    Labs: Basic Metabolic Panel: Recent Labs  Lab 01/21/18 0942 01/22/18 0247 01/23/18 0850  NA 138 136 136  K 3.1* 3.0* 3.9  CL 96* 96* 99  CO2 30 28 28   GLUCOSE 149* 279* 146*   BUN 10 <5* 5*  CREATININE 4.74* 2.91* 2.76*  CALCIUM 9.3 8.8* 9.1  PHOS  --  2.3*  --    Liver Function Tests: Recent Labs  Lab 01/22/18 0247  ALBUMIN 2.7*   No results for input(s): LIPASE, AMYLASE in the last 168 hours. No results for input(s): AMMONIA in the last 168 hours. CBC: Recent Labs  Lab 01/21/18 0942 01/22/18 0247 01/23/18 0850  WBC 8.1 9.5 7.5  HGB 10.0* 10.0* 10.0*  HCT 33.7* 33.0* 33.7*  MCV 98.5 97.1 99.1  PLT 232 192 179   Cardiac Enzymes: No results for input(s): CKTOTAL, CKMB, CKMBINDEX, TROPONINI in the last 168 hours. CBG: Recent Labs  Lab 01/22/18 1213 01/22/18 1745 01/22/18 2143 01/23/18 0731 01/23/18 1159  GLUCAP 108* 117* 229* 72 263*    Studies/Results: No results found. Medications: . sodium chloride    . sodium chloride    . sodium chloride     . aspirin EC  81 mg Oral Daily  . atorvastatin  20 mg Oral QHS  . carvedilol  12.5 mg Oral BID WC  . Chlorhexidine Gluconate Cloth  6 each  Topical Q0600  . Chlorhexidine Gluconate Cloth  6 each Topical Q0600  . cloNIDine  0.2 mg Oral TID  . clopidogrel  75 mg Oral Daily  . [START ON 01/24/2018] doxercalciferol  4 mcg Intravenous Q M,W,F-HD  . doxycycline  100 mg Oral BID  . gabapentin  100 mg Oral QHS  . heparin  1,000 Units Dialysis Once in dialysis  . hydrocerin  719 application Topical BID  . insulin aspart  0-9 Units Subcutaneous TID WC  . insulin NPH Human  10 Units Subcutaneous QHS  . ipratropium-albuterol  3 mL Nebulization TID  . mometasone-formoterol  2 puff Inhalation BID  . multivitamin  1 tablet Oral Daily  . pantoprazole  40 mg Oral Daily  . polyethylene glycol  17 g Oral Daily  . potassium chloride  20 mEq Oral Once

## 2018-01-24 DIAGNOSIS — E1122 Type 2 diabetes mellitus with diabetic chronic kidney disease: Secondary | ICD-10-CM | POA: Diagnosis not present

## 2018-01-24 DIAGNOSIS — E876 Hypokalemia: Secondary | ICD-10-CM | POA: Diagnosis not present

## 2018-01-24 DIAGNOSIS — I159 Secondary hypertension, unspecified: Secondary | ICD-10-CM | POA: Diagnosis not present

## 2018-01-24 DIAGNOSIS — N2581 Secondary hyperparathyroidism of renal origin: Secondary | ICD-10-CM | POA: Diagnosis not present

## 2018-01-24 DIAGNOSIS — Z992 Dependence on renal dialysis: Secondary | ICD-10-CM | POA: Diagnosis not present

## 2018-01-24 DIAGNOSIS — N186 End stage renal disease: Secondary | ICD-10-CM | POA: Diagnosis not present

## 2018-01-24 DIAGNOSIS — Z4781 Encounter for orthopedic aftercare following surgical amputation: Secondary | ICD-10-CM | POA: Diagnosis not present

## 2018-01-24 DIAGNOSIS — Z794 Long term (current) use of insulin: Secondary | ICD-10-CM | POA: Diagnosis not present

## 2018-01-24 DIAGNOSIS — D631 Anemia in chronic kidney disease: Secondary | ICD-10-CM | POA: Diagnosis not present

## 2018-01-24 DIAGNOSIS — E1151 Type 2 diabetes mellitus with diabetic peripheral angiopathy without gangrene: Secondary | ICD-10-CM | POA: Diagnosis not present

## 2018-01-24 DIAGNOSIS — Z89421 Acquired absence of other right toe(s): Secondary | ICD-10-CM | POA: Diagnosis not present

## 2018-01-24 DIAGNOSIS — D509 Iron deficiency anemia, unspecified: Secondary | ICD-10-CM | POA: Diagnosis not present

## 2018-01-25 DIAGNOSIS — Z794 Long term (current) use of insulin: Secondary | ICD-10-CM | POA: Diagnosis not present

## 2018-01-25 DIAGNOSIS — Z89421 Acquired absence of other right toe(s): Secondary | ICD-10-CM | POA: Diagnosis not present

## 2018-01-25 DIAGNOSIS — E1151 Type 2 diabetes mellitus with diabetic peripheral angiopathy without gangrene: Secondary | ICD-10-CM | POA: Diagnosis not present

## 2018-01-25 DIAGNOSIS — N186 End stage renal disease: Secondary | ICD-10-CM | POA: Diagnosis not present

## 2018-01-25 DIAGNOSIS — E1122 Type 2 diabetes mellitus with diabetic chronic kidney disease: Secondary | ICD-10-CM | POA: Diagnosis not present

## 2018-01-25 DIAGNOSIS — Z4781 Encounter for orthopedic aftercare following surgical amputation: Secondary | ICD-10-CM | POA: Diagnosis not present

## 2018-01-26 DIAGNOSIS — D631 Anemia in chronic kidney disease: Secondary | ICD-10-CM | POA: Diagnosis not present

## 2018-01-26 DIAGNOSIS — N186 End stage renal disease: Secondary | ICD-10-CM | POA: Diagnosis not present

## 2018-01-26 DIAGNOSIS — D509 Iron deficiency anemia, unspecified: Secondary | ICD-10-CM | POA: Diagnosis not present

## 2018-01-26 DIAGNOSIS — E1122 Type 2 diabetes mellitus with diabetic chronic kidney disease: Secondary | ICD-10-CM | POA: Diagnosis not present

## 2018-01-26 DIAGNOSIS — N2581 Secondary hyperparathyroidism of renal origin: Secondary | ICD-10-CM | POA: Diagnosis not present

## 2018-01-26 DIAGNOSIS — E876 Hypokalemia: Secondary | ICD-10-CM | POA: Diagnosis not present

## 2018-01-28 DIAGNOSIS — E1122 Type 2 diabetes mellitus with diabetic chronic kidney disease: Secondary | ICD-10-CM | POA: Diagnosis not present

## 2018-01-28 DIAGNOSIS — E876 Hypokalemia: Secondary | ICD-10-CM | POA: Diagnosis not present

## 2018-01-28 DIAGNOSIS — N2581 Secondary hyperparathyroidism of renal origin: Secondary | ICD-10-CM | POA: Diagnosis not present

## 2018-01-28 DIAGNOSIS — N186 End stage renal disease: Secondary | ICD-10-CM | POA: Diagnosis not present

## 2018-01-28 DIAGNOSIS — D509 Iron deficiency anemia, unspecified: Secondary | ICD-10-CM | POA: Diagnosis not present

## 2018-01-28 DIAGNOSIS — D631 Anemia in chronic kidney disease: Secondary | ICD-10-CM | POA: Diagnosis not present

## 2018-01-31 DIAGNOSIS — N186 End stage renal disease: Secondary | ICD-10-CM | POA: Diagnosis not present

## 2018-01-31 DIAGNOSIS — E876 Hypokalemia: Secondary | ICD-10-CM | POA: Diagnosis not present

## 2018-01-31 DIAGNOSIS — E1122 Type 2 diabetes mellitus with diabetic chronic kidney disease: Secondary | ICD-10-CM | POA: Diagnosis not present

## 2018-01-31 DIAGNOSIS — D631 Anemia in chronic kidney disease: Secondary | ICD-10-CM | POA: Diagnosis not present

## 2018-01-31 DIAGNOSIS — N2581 Secondary hyperparathyroidism of renal origin: Secondary | ICD-10-CM | POA: Diagnosis not present

## 2018-01-31 DIAGNOSIS — D509 Iron deficiency anemia, unspecified: Secondary | ICD-10-CM | POA: Diagnosis not present

## 2018-02-01 DIAGNOSIS — E1151 Type 2 diabetes mellitus with diabetic peripheral angiopathy without gangrene: Secondary | ICD-10-CM | POA: Diagnosis not present

## 2018-02-01 DIAGNOSIS — N186 End stage renal disease: Secondary | ICD-10-CM | POA: Diagnosis not present

## 2018-02-01 DIAGNOSIS — Z89421 Acquired absence of other right toe(s): Secondary | ICD-10-CM | POA: Diagnosis not present

## 2018-02-01 DIAGNOSIS — Z4781 Encounter for orthopedic aftercare following surgical amputation: Secondary | ICD-10-CM | POA: Diagnosis not present

## 2018-02-01 DIAGNOSIS — E1122 Type 2 diabetes mellitus with diabetic chronic kidney disease: Secondary | ICD-10-CM | POA: Diagnosis not present

## 2018-02-01 DIAGNOSIS — Z794 Long term (current) use of insulin: Secondary | ICD-10-CM | POA: Diagnosis not present

## 2018-02-02 ENCOUNTER — Other Ambulatory Visit: Payer: Self-pay

## 2018-02-02 DIAGNOSIS — E1122 Type 2 diabetes mellitus with diabetic chronic kidney disease: Secondary | ICD-10-CM | POA: Diagnosis not present

## 2018-02-02 DIAGNOSIS — I7025 Atherosclerosis of native arteries of other extremities with ulceration: Secondary | ICD-10-CM

## 2018-02-02 DIAGNOSIS — N2581 Secondary hyperparathyroidism of renal origin: Secondary | ICD-10-CM | POA: Diagnosis not present

## 2018-02-02 DIAGNOSIS — D509 Iron deficiency anemia, unspecified: Secondary | ICD-10-CM | POA: Diagnosis not present

## 2018-02-02 DIAGNOSIS — D631 Anemia in chronic kidney disease: Secondary | ICD-10-CM | POA: Diagnosis not present

## 2018-02-02 DIAGNOSIS — N186 End stage renal disease: Secondary | ICD-10-CM | POA: Diagnosis not present

## 2018-02-02 DIAGNOSIS — E876 Hypokalemia: Secondary | ICD-10-CM | POA: Diagnosis not present

## 2018-02-02 DIAGNOSIS — I739 Peripheral vascular disease, unspecified: Secondary | ICD-10-CM

## 2018-02-03 ENCOUNTER — Ambulatory Visit (INDEPENDENT_AMBULATORY_CARE_PROVIDER_SITE_OTHER): Payer: Medicare Other | Admitting: Orthopedic Surgery

## 2018-02-03 ENCOUNTER — Telehealth (INDEPENDENT_AMBULATORY_CARE_PROVIDER_SITE_OTHER): Payer: Self-pay | Admitting: Orthopedic Surgery

## 2018-02-03 ENCOUNTER — Encounter (INDEPENDENT_AMBULATORY_CARE_PROVIDER_SITE_OTHER): Payer: Self-pay | Admitting: Orthopedic Surgery

## 2018-02-03 DIAGNOSIS — Z4781 Encounter for orthopedic aftercare following surgical amputation: Secondary | ICD-10-CM | POA: Diagnosis not present

## 2018-02-03 DIAGNOSIS — E1122 Type 2 diabetes mellitus with diabetic chronic kidney disease: Secondary | ICD-10-CM | POA: Diagnosis not present

## 2018-02-03 DIAGNOSIS — Z89421 Acquired absence of other right toe(s): Secondary | ICD-10-CM | POA: Diagnosis not present

## 2018-02-03 DIAGNOSIS — Z794 Long term (current) use of insulin: Secondary | ICD-10-CM | POA: Diagnosis not present

## 2018-02-03 DIAGNOSIS — E1151 Type 2 diabetes mellitus with diabetic peripheral angiopathy without gangrene: Secondary | ICD-10-CM | POA: Diagnosis not present

## 2018-02-03 DIAGNOSIS — N186 End stage renal disease: Secondary | ICD-10-CM | POA: Diagnosis not present

## 2018-02-03 DIAGNOSIS — Z89431 Acquired absence of right foot: Secondary | ICD-10-CM

## 2018-02-03 NOTE — Progress Notes (Signed)
Office Visit Note   Patient: Destiny Day           Date of Birth: Jun 13, 1951           MRN: [id number] Visit Date: 02/03/2018              Requested by: Velna Hatchet, MD [address] [place], [place] [address] PCP: Velna Hatchet, MD  Chief Complaint  Patient presents with  . Right Foot - Routine Post Op    12/31/17 right transmet amputation       HPI: Patient presents in follow-up status post right foot transmetatarsal amputation.  Assessment & Plan: Visit Diagnoses:  1. 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[place])     Plan: The incision is well healed. Sutures harvested. Follow with [facility] for extra-depth shoe orthotic spacer and double upright brace on the right she will need a carbon plate.  Follow-Up Instructions: Return in about 1 month (around 03/03/2018).   Ortho Exam  Patient is alert, oriented, no adenopathy, well-dressed, normal affect, normal respiratory effort. Examination patient has venous stasis swelling in the right foot the surgical incision is well healed. No erythema, drainage or odor. No sign of infection.   Imaging: No results found. No images are attached to the encounter.  Labs: Lab Results  Component Value Date   HGBA1C 6.4 (H) 12/31/2017   HGBA1C 5.0 09/03/2017   HGBA1C 7.0 (H) 08/11/2017   REPTSTATUS 08/25/2017 FINAL 08/24/2017   CULT NO GROWTH 08/24/2017   LABORGA STAPHYLOCOCCUS AUREUS 11/29/2008     Lab Results  Component Value Date   ALBUMIN 2.7 (L) 01/22/2018   ALBUMIN 2.5 (L) 09/13/2017   ALBUMIN 2.5 (L) 09/10/2017    Body mass index is 23.46 kg/m.  Orders:  No orders of the defined types were placed in this encounter.  No orders of the defined types were placed in this encounter.    Procedures: No procedures performed  Clinical Data: No additional findings.  ROS:  All other systems negative, except as noted in the HPI. Review of Systems  Constitutional: Negative for chills and fever.      Objective: Vital Signs: Ht 5\' 5"  (1.651 m)   Wt 141 lb (64 kg)   BMI 23.46 kg/m   Specialty Comments:  No specialty comments available.  PMFS History: Patient Active Problem List   Diagnosis Date Noted  . 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[place]) 02/03/2018  . End-stage renal disease on hemodialysis ([place])   . Respiratory failure ([place]) 01/21/2018  . Achilles tendon contracture, right 11/09/2017  . Labile blood glucose   . Anemia of infection and chronic disease   . Essential hypertension   . Black stool   . Right sided weakness   . Subdural hemorrhage following injury ([place]) 08/30/2017  . Diabetic peripheral neuropathy ([place])   . Chronic obstructive pulmonary disease ([place])   . Seizure prophylaxis   . Subdural hematoma ([place]) 08/24/2017  . Traumatic subdural hematoma ([place]) 08/23/2017  . Fall   . SDH (subdural hematoma) ([place])   . Acute respiratory failure with hypoxia ([place])   . Diabetes mellitus type 2 in nonobese (HCC)   . Leukocytosis   . Labile blood pressure   . Generalized anxiety disorder   . Debility 08/16/2017  . Amputated great toe of right foot ([place])   . Amputated toe of left foot ([place])   . Post-operative pain   . ESRD on dialysis ([place])   . Diabetes mellitus ([place])   .  Anxiety state   . Benign essential HTN   . Acute blood loss anemia   . Anemia of chronic disease   . Idiopathic chronic venous hypertension of both lower extremities with inflammation 10/13/2016  . S/P transmetatarsal amputation of foot, left ([place]) 09/21/2016  . Onychomycosis 08/15/2016  . PAD (peripheral artery disease) ([place]) 07/08/2016  . Atherosclerosis of native artery of left lower extremity with gangrene ([place]) 06/23/2016  . GERD (gastroesophageal reflux disease) 10/07/2015  . ARF (acute renal failure) ([place])   . Hypothermia 06/12/2015  . Acute on chronic renal failure ([place]) 06/12/2015  . CHF (congestive heart failure) ([place]) 07/30/2014  . CKD (chronic kidney disease)  stage 4, GFR 15-29 ml/min (HCC) 06/27/2014  . Hypertensive heart disease 05/25/2013  . CKD (chronic kidney disease) stage 3, GFR 30-59 ml/min (HCC) 05/25/2013  . Pulmonary edema 05/23/2013  . Type 2 diabetes mellitus with diabetic nephropathy ([place]) 05/23/2013  . Type 2 diabetes mellitus with hyperosmolar nonketotic hyperglycemia ([place]) 04/13/2013  . Chronic diastolic CHF (congestive heart failure) ([place]) 04/13/2013  . UGI bleed 03/31/2013  . Hypokalemia 08/24/2012  . Type II or unspecified type diabetes mellitus without mention of complication, not stated as uncontrolled 12/27/2007  . HLD (hyperlipidemia) 12/27/2007  . ALLERGIC RHINITIS 12/27/2007  . Asthma 12/27/2007   Past Medical History:  Diagnosis Date  . Abdominal bruit   . Anemia   . Anxiety   . Arthritis    Osteoarthritis  . Asthma   . Cervical disc disease    "pinced nerve"  . CHF (congestive heart failure) ([place])   . Complication of anesthesia    " after I got home from my last procedure, I started itching."  . Diabetes mellitus    Type II  . Diverticulitis   . ESRD on peritoneal dialysis [facility])    Hemodialysis - MWF- [place]   . GERD (gastroesophageal reflux disease)    from medications  . GI bleed 03/31/2013  . History of hiatal hernia   . Hyperlipidemia   . Hypertension   . Neuropathy    left leg  . Osteoporosis   . Peripheral vascular disease ([place])   . Pneumonia    "very young"  . Seasonal allergies   . Shortness of breath dyspnea    WIth exertion  . Sleep apnea    can't afford cpap    Family History  Problem Relation Age of Onset  . Other Mother        not sure of cause of death  . Diabetes Father   . Pancreatic cancer Maternal Grandmother   . Colon cancer Neg Hx     Past Surgical History:  Procedure Laterality Date  . A/V SHUNTOGRAM N/A 09/22/2016   Procedure: A/V Shuntogram - left arm;  Surgeon: Serafina Mitchell, MD;  Location: [place] CV LAB;  Service: Cardiovascular;  Laterality: N/A;  .  ABDOMINAL HYSTERECTOMY  1993`  . AMPUTATION Left 09/01/2016   Procedure: LEFT FOOT TRANSMETATARSAL AMPUTATION;  Surgeon: Newt Minion, MD;  Location: [place];  Service: Orthopedics;  Laterality: Left;  . AMPUTATION Right 08/11/2017   Procedure: RIGHT GREAT TOE AMPUTATION DIGIT;  Surgeon: Rosetta Posner, MD;  Location: [place];  Service: Vascular;  Laterality: Right;  . AMPUTATION Right 12/31/2017   Procedure: RIGHT TRANSMETATARSAL AMPUTATION;  Surgeon: Newt Minion, MD;  Location: [place];  Service: Orthopedics;  Laterality: Right;  . AV FISTULA PLACEMENT Left 04/21/2016   Procedure: INSERTION OF ARTERIOVENOUS (AV) GORE-TEX GRAFT ARM LEFT;  Surgeon: Elam Dutch, MD;  Location: [place];  Service: Vascular;  Laterality: Left;  . [place] TRANSPOSITION Left 07/10/2014   Procedure: BASCILIC VEIN TRANSPOSITION;  Surgeon: Angelia Mould, MD;  Location: [place];  Service: Vascular;  Laterality: Left;  . BASCILIC VEIN TRANSPOSITION Right 11/08/2014   Procedure: FIRST STAGE BASILIC VEIN TRANSPOSITION;  Surgeon: Angelia Mould, MD;  Location: [place];  Service: Vascular;  Laterality: Right;  . BASCILIC VEIN TRANSPOSITION Right 01/18/2015   Procedure: SECOND STAGE BASILIC VEIN TRANSPOSITION;  Surgeon: Angelia Mould, MD;  Location: [place];  Service: Vascular;  Laterality: Right;  . CRANIOTOMY N/A 08/23/2017   Procedure: CRANIOTOMY HEMATOMA EVACUATION SUBDURAL;  Surgeon: Ashok Pall, MD;  Location: [place];  Service: Neurosurgery;  Laterality: N/A;  . CRANIOTOMY Left 08/24/2017   Procedure: CRANIOTOMY FOR RECURRENT ACUTE SUBDURAL HEMATOMA;  Surgeon: Ashok Pall, MD;  Location: [place];  Service: Neurosurgery;  Laterality: Left;  . ESOPHAGOGASTRODUODENOSCOPY N/A 03/31/2013   Procedure: ESOPHAGOGASTRODUODENOSCOPY (EGD);  Surgeon: Gatha Mayer, MD;  Location: [facility] ENDOSCOPY;  Service: Endoscopy;  Laterality: N/A;  . EYE SURGERY     laser surgery  . FEMORAL-POPLITEAL BYPASS GRAFT Left 07/08/2016    Procedure: LEFT  FEMORAL-BELOW KNEE POPLITEAL ARTERY BYPASS GRAFT USING 6MM X 80 CM PROPATEN GORETEX GRAFT WITH RINGS.;  Surgeon: Rosetta Posner, MD;  Location: [place];  Service: Vascular;  Laterality: Left;  . FEMORAL-POPLITEAL BYPASS GRAFT Right 08/11/2017   Procedure: RIGHT FEMORAL TO BELOW KNEE POPLITEAL ARTERKY  BYPASS GRAFT USING 6MM RINGED PROPATEN GRAFT;  Surgeon: Rosetta Posner, MD;  Location: [place];  Service: Vascular;  Laterality: Right;  . FISTULOGRAM Left 10/29/2014   Procedure: FISTULOGRAM;  Surgeon: Angelia Mould, MD;  Location: [facility] CATH LAB;  Service: Cardiovascular;  Laterality: Left;  . LIGATION OF ARTERIOVENOUS  FISTULA Left 04/21/2016   Procedure: LIGATION OF ARTERIOVENOUS  FISTULA LEFT ARM;  Surgeon: Elam Dutch, MD;  Location: [place];  Service: Vascular;  Laterality: Left;  . LOWER EXTREMITY ANGIOGRAPHY N/A 04/06/2017   Procedure: Lower Extremity Angiography - Right;  Surgeon: Serafina Mitchell, MD;  Location: [place] CV LAB;  Service: Cardiovascular;  Laterality: N/A;  . PATCH ANGIOPLASTY Right 01/18/2015   Procedure: BASILIC VEIN PATCH ANGIOPLASTY USING VASCUGUARD PATCH;  Surgeon: Angelia Mould, MD;  Location: [place];  Service: Vascular;  Laterality: Right;  . PERIPHERAL VASCULAR BALLOON ANGIOPLASTY  09/22/2016   Procedure: Peripheral Vascular Balloon Angioplasty;  Surgeon: Serafina Mitchell, MD;  Location: [place] CV LAB;  Service: Cardiovascular;;  Lt. Fistula  . PERIPHERAL VASCULAR CATHETERIZATION N/A 06/23/2016   Procedure: Abdominal Aortogram w/Lower Extremity;  Surgeon: Serafina Mitchell, MD;  Location: [place] CV LAB;  Service: Cardiovascular;  Laterality: N/A;  . PERIPHERAL VASCULAR CATHETERIZATION  06/23/2016   Procedure: Peripheral Vascular Intervention;  Surgeon: Serafina Mitchell, MD;  Location: [place] CV LAB;  Service: Cardiovascular;;  lt common and external illiac artery   Social History   Occupational History  . Occupation: Retired    Tobacco Use  . Smoking status: Former Smoker    Packs/day: 0.35    Years: 40.00    Pack years: 14.00    Types: Cigarettes    Last attempt to quit: 05/10/2012    Years since quitting: 5.7  . Smokeless tobacco: Never Used  Substance and Sexual Activity  . Alcohol use: No  . Drug use: No    Comment: marijuana; quit in early 1980's  .  Sexual activity: Yes

## 2018-02-03 NOTE — Telephone Encounter (Signed)
Called and lm onvm to advise verbal ok for HHPT

## 2018-02-03 NOTE — Telephone Encounter (Signed)
Destiny Day-(PT) with [facility] called left voicemail message needing verbal orders for HHPT 2 wk 2 and 1 wk 1 for discharge. She advised working on balance and gait training. The number to contact Destiny Day is [phone]

## 2018-02-04 DIAGNOSIS — D509 Iron deficiency anemia, unspecified: Secondary | ICD-10-CM | POA: Diagnosis not present

## 2018-02-04 DIAGNOSIS — D631 Anemia in chronic kidney disease: Secondary | ICD-10-CM | POA: Diagnosis not present

## 2018-02-04 DIAGNOSIS — E876 Hypokalemia: Secondary | ICD-10-CM | POA: Diagnosis not present

## 2018-02-04 DIAGNOSIS — N2581 Secondary hyperparathyroidism of renal origin: Secondary | ICD-10-CM | POA: Diagnosis not present

## 2018-02-04 DIAGNOSIS — N186 End stage renal disease: Secondary | ICD-10-CM | POA: Diagnosis not present

## 2018-02-04 DIAGNOSIS — E1122 Type 2 diabetes mellitus with diabetic chronic kidney disease: Secondary | ICD-10-CM | POA: Diagnosis not present

## 2018-02-07 DIAGNOSIS — N186 End stage renal disease: Secondary | ICD-10-CM | POA: Diagnosis not present

## 2018-02-07 DIAGNOSIS — D509 Iron deficiency anemia, unspecified: Secondary | ICD-10-CM | POA: Diagnosis not present

## 2018-02-07 DIAGNOSIS — D631 Anemia in chronic kidney disease: Secondary | ICD-10-CM | POA: Diagnosis not present

## 2018-02-07 DIAGNOSIS — E1122 Type 2 diabetes mellitus with diabetic chronic kidney disease: Secondary | ICD-10-CM | POA: Diagnosis not present

## 2018-02-07 DIAGNOSIS — E876 Hypokalemia: Secondary | ICD-10-CM | POA: Diagnosis not present

## 2018-02-07 DIAGNOSIS — N2581 Secondary hyperparathyroidism of renal origin: Secondary | ICD-10-CM | POA: Diagnosis not present

## 2018-02-08 DIAGNOSIS — E1122 Type 2 diabetes mellitus with diabetic chronic kidney disease: Secondary | ICD-10-CM | POA: Diagnosis not present

## 2018-02-08 DIAGNOSIS — E1151 Type 2 diabetes mellitus with diabetic peripheral angiopathy without gangrene: Secondary | ICD-10-CM | POA: Diagnosis not present

## 2018-02-08 DIAGNOSIS — Z4781 Encounter for orthopedic aftercare following surgical amputation: Secondary | ICD-10-CM | POA: Diagnosis not present

## 2018-02-08 DIAGNOSIS — N186 End stage renal disease: Secondary | ICD-10-CM | POA: Diagnosis not present

## 2018-02-08 DIAGNOSIS — Z794 Long term (current) use of insulin: Secondary | ICD-10-CM | POA: Diagnosis not present

## 2018-02-08 DIAGNOSIS — Z89421 Acquired absence of other right toe(s): Secondary | ICD-10-CM | POA: Diagnosis not present

## 2018-02-09 DIAGNOSIS — E1122 Type 2 diabetes mellitus with diabetic chronic kidney disease: Secondary | ICD-10-CM | POA: Diagnosis not present

## 2018-02-09 DIAGNOSIS — N186 End stage renal disease: Secondary | ICD-10-CM | POA: Diagnosis not present

## 2018-02-09 DIAGNOSIS — D631 Anemia in chronic kidney disease: Secondary | ICD-10-CM | POA: Diagnosis not present

## 2018-02-09 DIAGNOSIS — E876 Hypokalemia: Secondary | ICD-10-CM | POA: Diagnosis not present

## 2018-02-09 DIAGNOSIS — D509 Iron deficiency anemia, unspecified: Secondary | ICD-10-CM | POA: Diagnosis not present

## 2018-02-09 DIAGNOSIS — N2581 Secondary hyperparathyroidism of renal origin: Secondary | ICD-10-CM | POA: Diagnosis not present

## 2018-02-11 DIAGNOSIS — D509 Iron deficiency anemia, unspecified: Secondary | ICD-10-CM | POA: Diagnosis not present

## 2018-02-11 DIAGNOSIS — E876 Hypokalemia: Secondary | ICD-10-CM | POA: Diagnosis not present

## 2018-02-11 DIAGNOSIS — N2581 Secondary hyperparathyroidism of renal origin: Secondary | ICD-10-CM | POA: Diagnosis not present

## 2018-02-11 DIAGNOSIS — D631 Anemia in chronic kidney disease: Secondary | ICD-10-CM | POA: Diagnosis not present

## 2018-02-11 DIAGNOSIS — N186 End stage renal disease: Secondary | ICD-10-CM | POA: Diagnosis not present

## 2018-02-11 DIAGNOSIS — E1122 Type 2 diabetes mellitus with diabetic chronic kidney disease: Secondary | ICD-10-CM | POA: Diagnosis not present

## 2018-02-12 DIAGNOSIS — Z4781 Encounter for orthopedic aftercare following surgical amputation: Secondary | ICD-10-CM | POA: Diagnosis not present

## 2018-02-12 DIAGNOSIS — E1151 Type 2 diabetes mellitus with diabetic peripheral angiopathy without gangrene: Secondary | ICD-10-CM | POA: Diagnosis not present

## 2018-02-12 DIAGNOSIS — Z794 Long term (current) use of insulin: Secondary | ICD-10-CM | POA: Diagnosis not present

## 2018-02-12 DIAGNOSIS — N186 End stage renal disease: Secondary | ICD-10-CM | POA: Diagnosis not present

## 2018-02-12 DIAGNOSIS — Z89421 Acquired absence of other right toe(s): Secondary | ICD-10-CM | POA: Diagnosis not present

## 2018-02-12 DIAGNOSIS — E1122 Type 2 diabetes mellitus with diabetic chronic kidney disease: Secondary | ICD-10-CM | POA: Diagnosis not present

## 2018-02-14 DIAGNOSIS — D509 Iron deficiency anemia, unspecified: Secondary | ICD-10-CM | POA: Diagnosis not present

## 2018-02-14 DIAGNOSIS — E876 Hypokalemia: Secondary | ICD-10-CM | POA: Diagnosis not present

## 2018-02-14 DIAGNOSIS — N2581 Secondary hyperparathyroidism of renal origin: Secondary | ICD-10-CM | POA: Diagnosis not present

## 2018-02-14 DIAGNOSIS — E1122 Type 2 diabetes mellitus with diabetic chronic kidney disease: Secondary | ICD-10-CM | POA: Diagnosis not present

## 2018-02-14 DIAGNOSIS — D631 Anemia in chronic kidney disease: Secondary | ICD-10-CM | POA: Diagnosis not present

## 2018-02-14 DIAGNOSIS — N186 End stage renal disease: Secondary | ICD-10-CM | POA: Diagnosis not present

## 2018-02-15 DIAGNOSIS — E1122 Type 2 diabetes mellitus with diabetic chronic kidney disease: Secondary | ICD-10-CM | POA: Diagnosis not present

## 2018-02-15 DIAGNOSIS — N186 End stage renal disease: Secondary | ICD-10-CM | POA: Diagnosis not present

## 2018-02-15 DIAGNOSIS — Z89421 Acquired absence of other right toe(s): Secondary | ICD-10-CM | POA: Diagnosis not present

## 2018-02-15 DIAGNOSIS — E1151 Type 2 diabetes mellitus with diabetic peripheral angiopathy without gangrene: Secondary | ICD-10-CM | POA: Diagnosis not present

## 2018-02-15 DIAGNOSIS — Z794 Long term (current) use of insulin: Secondary | ICD-10-CM | POA: Diagnosis not present

## 2018-02-15 DIAGNOSIS — Z4781 Encounter for orthopedic aftercare following surgical amputation: Secondary | ICD-10-CM | POA: Diagnosis not present

## 2018-02-16 DIAGNOSIS — D509 Iron deficiency anemia, unspecified: Secondary | ICD-10-CM | POA: Diagnosis not present

## 2018-02-16 DIAGNOSIS — N186 End stage renal disease: Secondary | ICD-10-CM | POA: Diagnosis not present

## 2018-02-16 DIAGNOSIS — D631 Anemia in chronic kidney disease: Secondary | ICD-10-CM | POA: Diagnosis not present

## 2018-02-16 DIAGNOSIS — N2581 Secondary hyperparathyroidism of renal origin: Secondary | ICD-10-CM | POA: Diagnosis not present

## 2018-02-16 DIAGNOSIS — E1122 Type 2 diabetes mellitus with diabetic chronic kidney disease: Secondary | ICD-10-CM | POA: Diagnosis not present

## 2018-02-16 DIAGNOSIS — E876 Hypokalemia: Secondary | ICD-10-CM | POA: Diagnosis not present

## 2018-02-17 DIAGNOSIS — Z89421 Acquired absence of other right toe(s): Secondary | ICD-10-CM | POA: Diagnosis not present

## 2018-02-17 DIAGNOSIS — E1122 Type 2 diabetes mellitus with diabetic chronic kidney disease: Secondary | ICD-10-CM | POA: Diagnosis not present

## 2018-02-17 DIAGNOSIS — N186 End stage renal disease: Secondary | ICD-10-CM | POA: Diagnosis not present

## 2018-02-17 DIAGNOSIS — E1151 Type 2 diabetes mellitus with diabetic peripheral angiopathy without gangrene: Secondary | ICD-10-CM | POA: Diagnosis not present

## 2018-02-17 DIAGNOSIS — Z4781 Encounter for orthopedic aftercare following surgical amputation: Secondary | ICD-10-CM | POA: Diagnosis not present

## 2018-02-17 DIAGNOSIS — Z794 Long term (current) use of insulin: Secondary | ICD-10-CM | POA: Diagnosis not present

## 2018-02-18 DIAGNOSIS — N2581 Secondary hyperparathyroidism of renal origin: Secondary | ICD-10-CM | POA: Diagnosis not present

## 2018-02-18 DIAGNOSIS — D509 Iron deficiency anemia, unspecified: Secondary | ICD-10-CM | POA: Diagnosis not present

## 2018-02-18 DIAGNOSIS — D631 Anemia in chronic kidney disease: Secondary | ICD-10-CM | POA: Diagnosis not present

## 2018-02-18 DIAGNOSIS — E876 Hypokalemia: Secondary | ICD-10-CM | POA: Diagnosis not present

## 2018-02-18 DIAGNOSIS — E1122 Type 2 diabetes mellitus with diabetic chronic kidney disease: Secondary | ICD-10-CM | POA: Diagnosis not present

## 2018-02-18 DIAGNOSIS — N186 End stage renal disease: Secondary | ICD-10-CM | POA: Diagnosis not present

## 2018-02-21 ENCOUNTER — Encounter (HOSPITAL_COMMUNITY): Payer: Self-pay

## 2018-02-21 ENCOUNTER — Non-Acute Institutional Stay (HOSPITAL_COMMUNITY)
Admission: EM | Admit: 2018-02-21 | Discharge: 2018-02-21 | Disposition: A | Discharge status: Home / Self Care | Payer: Medicare Other | Attending: Emergency Medicine | Admitting: Emergency Medicine

## 2018-02-21 ENCOUNTER — Emergency Department (HOSPITAL_COMMUNITY): Payer: Medicare Other

## 2018-02-21 DIAGNOSIS — I120 Hypertensive chronic kidney disease with stage 5 chronic kidney disease or end stage renal disease: Secondary | ICD-10-CM | POA: Diagnosis not present

## 2018-02-21 DIAGNOSIS — M1990 Unspecified osteoarthritis, unspecified site: Secondary | ICD-10-CM | POA: Diagnosis not present

## 2018-02-21 DIAGNOSIS — N186 End stage renal disease: Secondary | ICD-10-CM | POA: Insufficient documentation

## 2018-02-21 DIAGNOSIS — R0602 Shortness of breath: Secondary | ICD-10-CM

## 2018-02-21 DIAGNOSIS — E1140 Type 2 diabetes mellitus with diabetic neuropathy, unspecified: Secondary | ICD-10-CM | POA: Diagnosis not present

## 2018-02-21 DIAGNOSIS — I132 Hypertensive heart and chronic kidney disease with heart failure and with stage 5 chronic kidney disease, or end stage renal disease: Secondary | ICD-10-CM | POA: Insufficient documentation

## 2018-02-21 DIAGNOSIS — R0600 Dyspnea, unspecified: Secondary | ICD-10-CM | POA: Diagnosis not present

## 2018-02-21 DIAGNOSIS — F411 Generalized anxiety disorder: Secondary | ICD-10-CM | POA: Insufficient documentation

## 2018-02-21 DIAGNOSIS — M810 Age-related osteoporosis without current pathological fracture: Secondary | ICD-10-CM | POA: Diagnosis not present

## 2018-02-21 DIAGNOSIS — Z794 Long term (current) use of insulin: Secondary | ICD-10-CM | POA: Insufficient documentation

## 2018-02-21 DIAGNOSIS — E785 Hyperlipidemia, unspecified: Secondary | ICD-10-CM | POA: Diagnosis not present

## 2018-02-21 DIAGNOSIS — J449 Chronic obstructive pulmonary disease, unspecified: Secondary | ICD-10-CM | POA: Diagnosis not present

## 2018-02-21 DIAGNOSIS — Z79899 Other long term (current) drug therapy: Secondary | ICD-10-CM | POA: Insufficient documentation

## 2018-02-21 DIAGNOSIS — Z992 Dependence on renal dialysis: Secondary | ICD-10-CM | POA: Diagnosis not present

## 2018-02-21 DIAGNOSIS — Z87891 Personal history of nicotine dependence: Secondary | ICD-10-CM | POA: Insufficient documentation

## 2018-02-21 DIAGNOSIS — K219 Gastro-esophageal reflux disease without esophagitis: Secondary | ICD-10-CM | POA: Insufficient documentation

## 2018-02-21 DIAGNOSIS — R000 Tachycardia, unspecified: Secondary | ICD-10-CM | POA: Diagnosis not present

## 2018-02-21 DIAGNOSIS — Z7982 Long term (current) use of aspirin: Secondary | ICD-10-CM | POA: Insufficient documentation

## 2018-02-21 DIAGNOSIS — E1122 Type 2 diabetes mellitus with diabetic chronic kidney disease: Secondary | ICD-10-CM | POA: Diagnosis not present

## 2018-02-21 DIAGNOSIS — E1151 Type 2 diabetes mellitus with diabetic peripheral angiopathy without gangrene: Secondary | ICD-10-CM | POA: Insufficient documentation

## 2018-02-21 DIAGNOSIS — R069 Unspecified abnormalities of breathing: Secondary | ICD-10-CM | POA: Diagnosis not present

## 2018-02-21 DIAGNOSIS — G4730 Sleep apnea, unspecified: Secondary | ICD-10-CM | POA: Insufficient documentation

## 2018-02-21 DIAGNOSIS — Z800 Family history of malignant neoplasm of digestive organs: Secondary | ICD-10-CM | POA: Insufficient documentation

## 2018-02-21 DIAGNOSIS — R0603 Acute respiratory distress: Secondary | ICD-10-CM | POA: Diagnosis not present

## 2018-02-21 DIAGNOSIS — J810 Acute pulmonary edema: Secondary | ICD-10-CM | POA: Diagnosis present

## 2018-02-21 DIAGNOSIS — R0689 Other abnormalities of breathing: Secondary | ICD-10-CM | POA: Diagnosis not present

## 2018-02-21 DIAGNOSIS — R0902 Hypoxemia: Secondary | ICD-10-CM | POA: Insufficient documentation

## 2018-02-21 DIAGNOSIS — Z7902 Long term (current) use of antithrombotics/antiplatelets: Secondary | ICD-10-CM | POA: Diagnosis not present

## 2018-02-21 DIAGNOSIS — J90 Pleural effusion, not elsewhere classified: Secondary | ICD-10-CM | POA: Insufficient documentation

## 2018-02-21 DIAGNOSIS — Z888 Allergy status to other drugs, medicaments and biological substances status: Secondary | ICD-10-CM | POA: Insufficient documentation

## 2018-02-21 DIAGNOSIS — E1121 Type 2 diabetes mellitus with diabetic nephropathy: Secondary | ICD-10-CM | POA: Insufficient documentation

## 2018-02-21 DIAGNOSIS — I5032 Chronic diastolic (congestive) heart failure: Secondary | ICD-10-CM | POA: Insufficient documentation

## 2018-02-21 LAB — I-STAT TROPONIN, ED: Troponin i, poc: 0.03 ng/mL (ref 0.00–0.08)

## 2018-02-21 LAB — BASIC METABOLIC PANEL
Anion gap: 14 (ref 5–15)
BUN: 38 mg/dL — ABNORMAL HIGH (ref 8–23)
CO2: 25 mmol/L (ref 22–32)
Calcium: 9.4 mg/dL (ref 8.9–10.3)
Chloride: 95 mmol/L — ABNORMAL LOW (ref 98–111)
Creatinine, Ser: 6.05 mg/dL — ABNORMAL HIGH (ref 0.44–1.00)
GFR calc Af Amer: 8 mL/min — ABNORMAL LOW (ref >60)
GFR calc non Af Amer: 7 mL/min — ABNORMAL LOW (ref >60)
Glucose, Bld: 136 mg/dL — ABNORMAL HIGH (ref 70–99)
Potassium: 3.9 mmol/L (ref 3.5–5.1)
Sodium: 134 mmol/L — ABNORMAL LOW (ref 135–145)

## 2018-02-21 LAB — CBC WITH DIFFERENTIAL/PLATELET
Abs Immature Granulocytes: 0 10*3/uL (ref 0.0–0.1)
Basophils Absolute: 0 10*3/uL (ref 0.0–0.1)
Basophils Relative: 1 %
Eosinophils Absolute: 0.6 10*3/uL (ref 0.0–0.7)
Eosinophils Relative: 7 %
HCT: 38.9 % (ref 36.0–46.0)
Hemoglobin: 12.2 g/dL (ref 12.0–15.0)
Immature Granulocytes: 1 %
Lymphocytes Relative: 24 %
Lymphs Abs: 2.1 10*3/uL (ref 0.7–4.0)
MCH: 30.1 pg (ref 26.0–34.0)
MCHC: 31.4 g/dL (ref 30.0–36.0)
MCV: 96 fL (ref 78.0–100.0)
Monocytes Absolute: 0.7 10*3/uL (ref 0.1–1.0)
Monocytes Relative: 8 %
Neutro Abs: 5.3 10*3/uL (ref 1.7–7.7)
Neutrophils Relative %: 61 %
Platelets: 297 10*3/uL (ref 150–400)
RBC: 4.05 MIL/uL (ref 3.87–5.11)
RDW: 15.3 % (ref 11.5–15.5)
WBC: 8.8 10*3/uL (ref 4.0–10.5)

## 2018-02-21 LAB — CBG MONITORING, ED
Glucose-Capillary: 103 mg/dL — ABNORMAL HIGH (ref 70–99)
Glucose-Capillary: 109 mg/dL — ABNORMAL HIGH (ref 70–99)

## 2018-02-21 MED ORDER — HEPARIN SODIUM (PORCINE) 1000 UNIT/ML DIALYSIS
1000.0000 [IU] | Freq: Once | INTRAMUSCULAR | Status: DC
  Filled 2018-02-21: qty 1

## 2018-02-21 MED ORDER — HYDRALAZINE HCL 20 MG/ML IJ SOLN: 10.0000 mg | Freq: Once | INTRAMUSCULAR | Status: DC

## 2018-02-21 MED ORDER — SODIUM CHLORIDE 0.9 % IV SOLN: 100.0000 mL | INTRAVENOUS | Status: DC | PRN

## 2018-02-21 MED ORDER — LIDOCAINE-PRILOCAINE 2.5-2.5 % EX CREA
1.0000 "application " | TOPICAL_CREAM | CUTANEOUS | Status: DC | PRN
  Filled 2018-02-21: qty 5

## 2018-02-21 MED ORDER — CHLORHEXIDINE GLUCONATE CLOTH 2 % EX PADS: 6.0000 | MEDICATED_PAD | Freq: Every day | CUTANEOUS | Status: DC

## 2018-02-21 MED ORDER — PENTAFLUOROPROP-TETRAFLUOROETH EX AERO
1.0000 "application " | INHALATION_SPRAY | CUTANEOUS | Status: DC | PRN
  Filled 2018-02-21: qty 103.5

## 2018-02-21 NOTE — Consult Note (Addendum)
Renal Service Consult Note Destiny Day 02/21/2018 [place] Requesting Physician:  Dr Kathrynn Humble  Reason for Consult:  ESRD pt w/ resp distress  HPI: The patient is a 67 y.o. year-old w/ hx of HTN, DM, GIB, CHF, DJD, PVD. She was here in Jan/Feb 2019 after fall w/ SDH requiring craniotomy and rehab. Then was here in June w/ vol overload/ resp distress treated w/ HD.  Pt presents to ED tonight w/ acute onset SOB. No CP, no fevers, feeling better on bipap now in ED.  Patient is due for dialysis today.  Pt denies excess fluid but husband states that she drinks "way too much sodas".  He works however and is not around at all times to monitor her. +hx asthma but no recent wheezing.   ROS  denies CP  no joint pain   no HA  no blurry vision  no rash  no diarrhea  no nausea/ vomiting    Past Medical History  Past Medical History:  Diagnosis Date  . Abdominal bruit   . Anemia   . Anxiety   . Arthritis    Osteoarthritis  . Asthma   . Cervical disc disease    "pinced nerve"  . CHF (congestive heart failure) ([place])   . Complication of anesthesia    " after I got home from my last procedure, I started itching."  . Diabetes mellitus    Type II  . Diverticulitis   . ESRD on peritoneal dialysis [facility])    Hemodialysis - MWF- [place]   . GERD (gastroesophageal reflux disease)    from medications  . GI bleed 03/31/2013  . History of hiatal hernia   . Hyperlipidemia   . Hypertension   . Neuropathy    left leg  . Osteoporosis   . Peripheral vascular disease ([place])   . Pneumonia    "very young"  . Seasonal allergies   . Shortness of breath dyspnea    WIth exertion  . Sleep apnea    can't afford cpap   Past Surgical History  Past Surgical History:  Procedure Laterality Date  . A/V SHUNTOGRAM N/A 09/22/2016   Procedure: A/V Shuntogram - left arm;  Surgeon: Serafina Mitchell, MD;  Location: [place] CV LAB;  Service: Cardiovascular;   Laterality: N/A;  . ABDOMINAL HYSTERECTOMY  1993`  . AMPUTATION Left 09/01/2016   Procedure: LEFT FOOT TRANSMETATARSAL AMPUTATION;  Surgeon: Newt Minion, MD;  Location: [place];  Service: Orthopedics;  Laterality: Left;  . AMPUTATION Right 08/11/2017   Procedure: RIGHT GREAT TOE AMPUTATION DIGIT;  Surgeon: Rosetta Posner, MD;  Location: [place];  Service: Vascular;  Laterality: Right;  . AMPUTATION Right 12/31/2017   Procedure: RIGHT TRANSMETATARSAL AMPUTATION;  Surgeon: Newt Minion, MD;  Location: [place];  Service: Orthopedics;  Laterality: Right;  . AV FISTULA PLACEMENT Left 04/21/2016   Procedure: INSERTION OF ARTERIOVENOUS (AV) GORE-TEX GRAFT ARM LEFT;  Surgeon: Elam Dutch, MD;  Location: [place];  Service: Vascular;  Laterality: Left;  . [place] TRANSPOSITION Left 07/10/2014   Procedure: BASCILIC VEIN TRANSPOSITION;  Surgeon: Angelia Mould, MD;  Location: [place];  Service: Vascular;  Laterality: Left;  . BASCILIC VEIN TRANSPOSITION Right 11/08/2014   Procedure: FIRST STAGE BASILIC VEIN TRANSPOSITION;  Surgeon: Angelia Mould, MD;  Location: [place];  Service: Vascular;  Laterality: Right;  . BASCILIC VEIN TRANSPOSITION Right 01/18/2015   Procedure: SECOND STAGE BASILIC VEIN TRANSPOSITION;  Surgeon:  Angelia Mould, MD;  Location: [place];  Service: Vascular;  Laterality: Right;  . CRANIOTOMY N/A 08/23/2017   Procedure: CRANIOTOMY HEMATOMA EVACUATION SUBDURAL;  Surgeon: Ashok Pall, MD;  Location: [place];  Service: Neurosurgery;  Laterality: N/A;  . CRANIOTOMY Left 08/24/2017   Procedure: CRANIOTOMY FOR RECURRENT ACUTE SUBDURAL HEMATOMA;  Surgeon: Ashok Pall, MD;  Location: [place];  Service: Neurosurgery;  Laterality: Left;  . ESOPHAGOGASTRODUODENOSCOPY N/A 03/31/2013   Procedure: ESOPHAGOGASTRODUODENOSCOPY (EGD);  Surgeon: Gatha Mayer, MD;  Location: [facility] ENDOSCOPY;  Service: Endoscopy;  Laterality: N/A;  . EYE SURGERY     laser surgery  . FEMORAL-POPLITEAL BYPASS GRAFT  Left 07/08/2016   Procedure: LEFT  FEMORAL-BELOW KNEE POPLITEAL ARTERY BYPASS GRAFT USING 6MM X 80 CM PROPATEN GORETEX GRAFT WITH RINGS.;  Surgeon: Rosetta Posner, MD;  Location: [place];  Service: Vascular;  Laterality: Left;  . FEMORAL-POPLITEAL BYPASS GRAFT Right 08/11/2017   Procedure: RIGHT FEMORAL TO BELOW KNEE POPLITEAL ARTERKY  BYPASS GRAFT USING 6MM RINGED PROPATEN GRAFT;  Surgeon: Rosetta Posner, MD;  Location: [place];  Service: Vascular;  Laterality: Right;  . FISTULOGRAM Left 10/29/2014   Procedure: FISTULOGRAM;  Surgeon: Angelia Mould, MD;  Location: [facility] CATH LAB;  Service: Cardiovascular;  Laterality: Left;  . LIGATION OF ARTERIOVENOUS  FISTULA Left 04/21/2016   Procedure: LIGATION OF ARTERIOVENOUS  FISTULA LEFT ARM;  Surgeon: Elam Dutch, MD;  Location: [place];  Service: Vascular;  Laterality: Left;  . LOWER EXTREMITY ANGIOGRAPHY N/A 04/06/2017   Procedure: Lower Extremity Angiography - Right;  Surgeon: Serafina Mitchell, MD;  Location: [place] CV LAB;  Service: Cardiovascular;  Laterality: N/A;  . PATCH ANGIOPLASTY Right 01/18/2015   Procedure: BASILIC VEIN PATCH ANGIOPLASTY USING VASCUGUARD PATCH;  Surgeon: Angelia Mould, MD;  Location: [place];  Service: Vascular;  Laterality: Right;  . PERIPHERAL VASCULAR BALLOON ANGIOPLASTY  09/22/2016   Procedure: Peripheral Vascular Balloon Angioplasty;  Surgeon: Serafina Mitchell, MD;  Location: [place] CV LAB;  Service: Cardiovascular;;  Lt. Fistula  . PERIPHERAL VASCULAR CATHETERIZATION N/A 06/23/2016   Procedure: Abdominal Aortogram w/Lower Extremity;  Surgeon: Serafina Mitchell, MD;  Location: [place] CV LAB;  Service: Cardiovascular;  Laterality: N/A;  . PERIPHERAL VASCULAR CATHETERIZATION  06/23/2016   Procedure: Peripheral Vascular Intervention;  Surgeon: Serafina Mitchell, MD;  Location: [place] CV LAB;  Service: Cardiovascular;;  lt common and external illiac artery   Family History  Family History  Problem Relation  Age of Onset  . Other Mother        not sure of cause of death  . Diabetes Father   . Pancreatic cancer Maternal Grandmother   . Colon cancer Neg Hx    Social History  reports that she quit smoking about 5 years ago. Her smoking use included cigarettes. She has a 14.00 pack-year smoking history. She has never used smokeless tobacco. She reports that she does not drink alcohol or use drugs. Allergies  Allergies  Allergen Reactions  . Prednisone Swelling and Other (See Comments)    Excessive fluid buildup  . Lisinopril Cough   Home medications Prior to Admission medications   Medication Sig Start Date End Date Taking? Authorizing Provider  albuterol (PROVENTIL HFA;VENTOLIN HFA) 108 (90 Base) MCG/ACT inhaler Inhale 1-2 puffs into the lungs every 6 (six) hours as needed for wheezing or shortness of breath. 01/23/18   Allie Bossier, MD  Amino Acids-Protein Hydrolys (FEEDING SUPPLEMENT, PRO-STAT SUGAR FREE 64,) LIQD  Take 30 mLs by mouth 2 (two) times daily. 09/16/17   Love, Ivan Anchors, PA-C  aspirin EC 81 MG tablet Take 81 mg by mouth daily.    [provider]  atorvastatin (LIPITOR) 20 MG tablet Take 20 mg by mouth at bedtime.     [provider]  budesonide-formoterol (SYMBICORT) 160-4.5 MCG/ACT inhaler Inhale 1 puff into the lungs daily as needed (for respiratory issues).     [provider]  carvedilol (COREG) 12.5 MG tablet Take 12.5 mg by mouth 2 (two) times daily with a meal.    [provider]  cinacalcet (SENSIPAR) 60 MG tablet Take 60 mg by mouth daily. Pt. Gets at dialysis 01/06/18   [provider]  cloNIDine (CATAPRES) 0.2 MG tablet Take 1 tablet (0.2 mg total) by mouth 3 (three) times daily. 09/16/17   Love, Ivan Anchors, PA-C  clopidogrel (PLAVIX) 75 MG tablet Take 75 mg by mouth daily.    [provider]  Dakins (SODIUM HYPOCHLORITE) external solution Apply topically daily. Cleanse groin and cover with damp gauze soaked in dakins.  09/16/17   Love, Ivan Anchors, PA-C  diazepam (VALIUM) 5 MG tablet Take 5 mg by mouth 2 (two) times daily as needed for anxiety.    [provider]  doxycycline (VIBRA-TABS) 100 MG tablet Take 1 tablet (100 mg total) by mouth 2 (two) times daily. 12/23/17   Newt Minion, MD  fluticasone (FLONASE) 50 MCG/ACT nasal spray Place 1 spray into both nostrils daily as needed for allergies or rhinitis.    [provider]  gabapentin (NEURONTIN) 100 MG capsule TAKE 1 CAPSULE (100 MG TOTAL) BY MOUTH AT BEDTIME. WHEN NECESSARY FOR NEUROPATHY PAIN 01/11/18   Newt Minion, MD  hydrALAZINE (APRESOLINE) 25 MG tablet Take 25 mg by mouth daily. 01/17/18   [provider]  hydrocerin (EUCERIN) CREA Apply 017 application topically 2 (two) times daily. Patient taking differently: Apply 1 application topically 2 (two) times daily.  09/16/17   Love, Ivan Anchors, PA-C  insulin NPH Human (HUMULIN N,NOVOLIN N) 100 UNIT/ML injection Inject 0.1 mLs (10 Units total) into the skin at bedtime. 01/23/18   Allie Bossier, MD  insulin regular (NOVOLIN R,HUMULIN R) 100 units/mL injection Inject 3-8 Units into the skin 3 (three) times daily before meals. Per sliding scale on non-dialysis days    [provider]  LEVEMIR FLEXTOUCH 100 UNIT/ML Pen Inject 7 Units into the skin at bedtime. 09/16/17   Love, Ivan Anchors, PA-C  lidocaine, PF, (XYLOCAINE) 1 % SOLN injection Inject 5 mLs into the skin as needed (topical anesthesia for hemodialysis ifGEBAUERS is ineffective.). 01/23/18   Allie Bossier, MD  lidocaine-prilocaine (EMLA) cream Apply 1 application topically 3 (three) times a week. 1-2 hours prior to dialysis 01/07/18   [provider]  loratadine (CLARITIN) 10 MG tablet Take 10 mg by mouth daily as needed for allergies.    [provider]  multivitamin (RENA-VIT) TABS tablet Take 1 tablet by mouth daily.  01/24/15   [provider]  omeprazole (PRILOSEC) 20 MG capsule Take 20 mg by  mouth daily.     [provider]  oxyCODONE-acetaminophen (PERCOCET/ROXICET) 5-325 MG tablet Take 1 tablet by mouth every 4 (four) hours as needed for severe pain. 12/31/17   Newt Minion, MD  polyethylene glycol [facility] Floria Raveling) packet Take 17 g by mouth daily. Patient taking differently: Take 17 g by mouth daily as needed for moderate constipation.  09/17/17  Bary Leriche, PA-C   Liver Function Tests No results for input(s): AST, ALT, ALKPHOS, BILITOT, PROT, ALBUMIN in the last 168 hours. No results for input(s): LIPASE, AMYLASE in the last 168 hours. CBC No results for input(s): WBC, NEUTROABS, HGB, HCT, MCV, PLT in the last 168 hours. Basic Metabolic Panel No results for input(s): NA, K, CL, CO2, GLUCOSE, BUN, CREATININE, CALCIUM, PHOS in the last 168 hours. Iron/TIBC/Ferritin/ %Sat    Component Value Date/Time   IRON 31 08/28/2017 0414   TIBC 140 (L) 08/28/2017 0414   FERRITIN 1,854 (H) 08/28/2017 0414   IRONPCTSAT 22 08/28/2017 0414    Vitals:   02/21/18 0353 02/21/18 0356  BP: (!) 194/100 (!) 194/100  Pulse: (!) 101 99  Resp: (!) 25 (!) 22  Temp: 98.4 F (36.9 C)   TempSrc: Oral   SpO2: 94% 100%   Exam Gen chron ill older adult female, on bipap No rash, cyanosis or gangrene Sclera anicteric, throat not seen  No jvd or bruits Chest clear bilat, no wheezing RRR no MRG Abd soft ntnd no mass or ascites +bs GU defer MS no joint effusions or deformity Ext 2+ pitting RLE edema, no LLE edema,  no wounds or ulcers Neuro is alert, Ox 3 , nf    Home meds:  - coreg 12.5 bid/ clonidine 0.2 tid/ hydralazine 25 qd  - ecasa 81/ atorvastatin 20 qd/ plavix 75  - NPH insulin 10u hs/ novolin R tid ac/ levemir 7u hs  - PPI/ sensipar 60  - percocet prn / other prns  - symbicort 160-4.5 1 puff prn  Dialysis: [place] MWF  4h   LUA AVG  Heparin 1000    CXR - mild pulm edema  EKG - NSR no peaked T's or ischemic changes   Impression: 1  Resp distress - prob vol  overload, fluid intake is too large per husband. Have d/w patient.  Stable on bipap, will plan HD upstairs in HD unit first shift this am. Can go home after HD if stabilized.  2  ESRD MWF HD 3  HTN - cont meds 4  DM2 on insulin 5  SP craniotomy for SDH Jan 2019 6  PAD  Plan - as above  Kelly Splinter MD [place] pager [phone]   02/21/2018, 4:37 AM

## 2018-02-21 NOTE — ED Notes (Signed)
Pt brought in by EMS for SOB. Pt states, in short choppy sentences, that she had been SOB for 2 hours prior to calling 9-1-1. Pt states she has a hx of CHF as well as dialysis.

## 2018-02-21 NOTE — Procedures (Signed)
Patient was seen on dialysis and the procedure was supervised.  BFR 350  Via AVG BP is  155/79.   Patient appears to be tolerating treatment well- does not seem to be in distress at all- on bipap- asking for something to eat.  Plan to take off bipap and monitor- hopefully she will be able to be discharged after HD   Ivin Rosenbloom A 02/21/2018

## 2018-02-21 NOTE — ED Triage Notes (Signed)
Pt arrived via [facility], pt from home with c/o SOB worsening within last two hrs; pt is dialysis pt due for dialysis today; LA restricted; denies, N/V/D; 193/112 after nitro, rec'd 1, ST 105, 40 R, labored with crackles in lower lobes, CBG 149, hx DM

## 2018-02-21 NOTE — ED Notes (Addendum)
Family states the pt is a difficult stick. Attempted IV x1 without success. Dr. Kathrynn Humble advised he will place an ultrasound guided IV.

## 2018-02-21 NOTE — Progress Notes (Signed)
Transferred patient to dialysis on bipap without incident.

## 2018-02-21 NOTE — Progress Notes (Signed)
Pt. Discharged home with spouse. Tolerated hemodialysis. o2 sats 98-100% RA. CBG 109. Pt. Denies any complaints. Dr. Clover Mealy aware. No new orders

## 2018-02-21 NOTE — ED Provider Notes (Signed)
[facility] EMERGENCY DEPARTMENT Provider Note   CSN: [id number] Arrival date & time: 02/21/18  0346     History   Chief Complaint Chief Complaint  Patient presents with  . Respiratory Distress    HPI Destiny Day is a 67 y.o. female with history of ESRD on dialysis MWF, diabetes, CHF, COPD who presents with acute onset shortness of breath that began 2 hours prior to arrival.  Patient reports she awoke from sleep with shortness of breath. She denies chest pain.  Patient has not missed dialysis recently.  She last went 2 days ago and is due for dialysis later today.  She reports she has noticed a dry cough for the past 3 days.  Patient denies any new leg pain or swelling.  She also denies any abdominal pain, nausea, vomiting, fevers, urinary symptoms.  HPI  Past Medical History:  Diagnosis Date  . Abdominal bruit   . Anemia   . Anxiety   . Arthritis    Osteoarthritis  . Asthma   . Cervical disc disease    "pinced nerve"  . CHF (congestive heart failure) ([place])   . Complication of anesthesia    " after I got home from my last procedure, I started itching."  . Diabetes mellitus    Type II  . Diverticulitis   . ESRD on peritoneal dialysis [facility])    Hemodialysis - MWF- [place]   . GERD (gastroesophageal reflux disease)    from medications  . GI bleed 03/31/2013  . History of hiatal hernia   . Hyperlipidemia   . Hypertension   . Neuropathy    left leg  . Osteoporosis   . Peripheral vascular disease ([place])   . Pneumonia    "very young"  . Seasonal allergies   . Shortness of breath dyspnea    WIth exertion  . Sleep apnea    can't afford cpap    Patient Active Problem List   Diagnosis Date Noted  . Acute pulmonary edema ([place]) 02/21/2018  . 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[place]) 02/03/2018  . End-stage renal disease on hemodialysis ([place])   . Respiratory failure ([place]) 01/21/2018  . Achilles tendon contracture, right 11/09/2017   . Labile blood glucose   . Anemia of infection and chronic disease   . Essential hypertension   . Black stool   . Right sided weakness   . Subdural hemorrhage following injury ([place]) 08/30/2017  . Diabetic peripheral neuropathy ([place])   . Chronic obstructive pulmonary disease ([place])   . Seizure prophylaxis   . Subdural hematoma ([place]) 08/24/2017  . Traumatic subdural hematoma ([place]) 08/23/2017  . Fall   . SDH (subdural hematoma) ([place])   . Acute respiratory failure with hypoxia ([place])   . Diabetes mellitus type 2 in nonobese (HCC)   . Leukocytosis   . Labile blood pressure   . Generalized anxiety disorder   . Debility 08/16/2017  . Amputated great toe of right foot ([place])   . Amputated toe of left foot ([place])   . Post-operative pain   . ESRD on dialysis ([place])   . Diabetes mellitus ([place])   . Anxiety state   . Benign essential HTN   . Acute blood loss anemia   . Anemia of chronic disease   . Idiopathic chronic venous hypertension of both lower extremities with inflammation 10/13/2016  . S/P transmetatarsal amputation of foot, left ([place]) 09/21/2016  . Onychomycosis 08/15/2016  . PAD (peripheral artery disease) ([place])  07/08/2016  . Atherosclerosis of native artery of left lower extremity with gangrene ([place]) 06/23/2016  . GERD (gastroesophageal reflux disease) 10/07/2015  . ARF (acute renal failure) ([place])   . Hypothermia 06/12/2015  . Acute on chronic renal failure ([place]) 06/12/2015  . CHF (congestive heart failure) ([place]) 07/30/2014  . CKD (chronic kidney disease) stage 4, GFR 15-29 ml/min (HCC) 06/27/2014  . Hypertensive heart disease 05/25/2013  . CKD (chronic kidney disease) stage 3, GFR 30-59 ml/min (HCC) 05/25/2013  . Pulmonary edema 05/23/2013  . Type 2 diabetes mellitus with diabetic nephropathy ([place]) 05/23/2013  . Type 2 diabetes mellitus with hyperosmolar nonketotic hyperglycemia ([place]) 04/13/2013  . Chronic diastolic CHF (congestive heart failure) ([place]) 04/13/2013  . UGI bleed  03/31/2013  . Hypokalemia 08/24/2012  . Type II or unspecified type diabetes mellitus without mention of complication, not stated as uncontrolled 12/27/2007  . HLD (hyperlipidemia) 12/27/2007  . ALLERGIC RHINITIS 12/27/2007  . Asthma 12/27/2007    Past Surgical History:  Procedure Laterality Date  . A/V SHUNTOGRAM N/A 09/22/2016   Procedure: A/V Shuntogram - left arm;  Surgeon: Serafina Mitchell, MD;  Location: [facility];  Service: Cardiovascular;  Laterality: N/A;  . ABDOMINAL HYSTERECTOMY  1993`  . AMPUTATION Left 09/01/2016   Procedure: LEFT FOOT TRANSMETATARSAL AMPUTATION;  Surgeon: Newt Minion, MD;  Location: [facility];  Service: Orthopedics;  Laterality: Left;  . AMPUTATION Right 08/11/2017   Procedure: RIGHT GREAT TOE AMPUTATION DIGIT;  Surgeon: Rosetta Posner, MD;  Location: [place];  Service: Vascular;  Laterality: Right;  . AMPUTATION Right 12/31/2017   Procedure: RIGHT TRANSMETATARSAL AMPUTATION;  Surgeon: Newt Minion, MD;  Location: [facility] [place];  Service: Orthopedics;  Laterality: Right;  . AV FISTULA PLACEMENT Left 04/21/2016   Procedure: INSERTION OF ARTERIOVENOUS (AV) GORE-TEX GRAFT ARM LEFT;  Surgeon: Elam Dutch, MD;  Location: [place];  Service: Vascular;  Laterality: Left;  . [place] TRANSPOSITION Left 07/10/2014   Procedure: BASCILIC VEIN TRANSPOSITION;  Surgeon: Angelia Mould, MD;  Location: [facility];  Service: Vascular;  Laterality: Left;  . BASCILIC VEIN TRANSPOSITION Right 11/08/2014   Procedure: FIRST STAGE BASILIC VEIN TRANSPOSITION;  Surgeon: Angelia Mould, MD;  Location: [place];  Service: Vascular;  Laterality: Right;  . BASCILIC VEIN TRANSPOSITION Right 01/18/2015   Procedure: SECOND STAGE BASILIC VEIN TRANSPOSITION;  Surgeon: Angelia Mould, MD;  Location: [facility];  Service: Vascular;  Laterality: Right;  . CRANIOTOMY N/A 08/23/2017   Procedure: CRANIOTOMY HEMATOMA EVACUATION SUBDURAL;  Surgeon: Ashok Pall, MD;  Location: [facility];  Service:  Neurosurgery;  Laterality: N/A;  . CRANIOTOMY Left 08/24/2017   Procedure: CRANIOTOMY FOR RECURRENT ACUTE SUBDURAL HEMATOMA;  Surgeon: Ashok Pall, MD;  Location: [facility];  Service: Neurosurgery;  Laterality: Left;  . ESOPHAGOGASTRODUODENOSCOPY N/A 03/31/2013   Procedure: ESOPHAGOGASTRODUODENOSCOPY (EGD);  Surgeon: Gatha Mayer, MD;  Location: [facility] ENDOSCOPY;  Service: Endoscopy;  Laterality: N/A;  . EYE SURGERY     laser surgery  . FEMORAL-POPLITEAL BYPASS GRAFT Left 07/08/2016   Procedure: LEFT  FEMORAL-BELOW KNEE POPLITEAL ARTERY BYPASS GRAFT USING 6MM X 80 CM PROPATEN GORETEX GRAFT WITH RINGS.;  Surgeon: Rosetta Posner, MD;  Location: [place] [facility];  Service: Vascular;  Laterality: Left;  . FEMORAL-POPLITEAL BYPASS GRAFT Right 08/11/2017   Procedure: RIGHT FEMORAL TO BELOW KNEE POPLITEAL ARTERKY  BYPASS GRAFT USING 6MM RINGED PROPATEN GRAFT;  Surgeon: Rosetta Posner, MD;  Location: [place];  Service: Vascular;  Laterality: Right;  .  FISTULOGRAM Left 10/29/2014   Procedure: FISTULOGRAM;  Surgeon: Angelia Mould, MD;  Location: [facility] CATH LAB;  Service: Cardiovascular;  Laterality: Left;  . LIGATION OF ARTERIOVENOUS  FISTULA Left 04/21/2016   Procedure: LIGATION OF ARTERIOVENOUS  FISTULA LEFT ARM;  Surgeon: Elam Dutch, MD;  Location: [place] [facility];  Service: Vascular;  Laterality: Left;  . LOWER EXTREMITY ANGIOGRAPHY N/A 04/06/2017   Procedure: Lower Extremity Angiography - Right;  Surgeon: Serafina Mitchell, MD;  Location: [facility];  Service: Cardiovascular;  Laterality: N/A;  . PATCH ANGIOPLASTY Right 01/18/2015   Procedure: BASILIC VEIN PATCH ANGIOPLASTY USING VASCUGUARD PATCH;  Surgeon: Angelia Mould, MD;  Location: [place] [facility];  Service: Vascular;  Laterality: Right;  . PERIPHERAL VASCULAR BALLOON ANGIOPLASTY  09/22/2016   Procedure: Peripheral Vascular Balloon Angioplasty;  Surgeon: Serafina Mitchell, MD;  Location: [place] [facility] [facility];  Service: Cardiovascular;;  Lt. Fistula  . PERIPHERAL VASCULAR  CATHETERIZATION N/A 06/23/2016   Procedure: Abdominal Aortogram w/Lower Extremity;  Surgeon: Serafina Mitchell, MD;  Location: [facility];  Service: Cardiovascular;  Laterality: N/A;  . PERIPHERAL VASCULAR CATHETERIZATION  06/23/2016   Procedure: Peripheral Vascular Intervention;  Surgeon: Serafina Mitchell, MD;  Location: [facility];  Service: Cardiovascular;;  lt common and external illiac artery     OB History   None      Home Medications    Prior to Admission medications   Medication Sig Start Date End Date Taking? Authorizing Provider  albuterol (PROVENTIL HFA;VENTOLIN HFA) 108 (90 Base) MCG/ACT inhaler Inhale 1-2 puffs into the lungs every 6 (six) hours as needed for wheezing or shortness of breath. 01/23/18  Yes Allie Bossier, MD  aspirin EC 81 MG tablet Take 81 mg by mouth daily.   Yes [provider]  atorvastatin (LIPITOR) 20 MG tablet Take 20 mg by mouth at bedtime.    Yes [provider]  budesonide-formoterol (SYMBICORT) 160-4.5 MCG/ACT inhaler Inhale 1 puff into the lungs daily as needed (for respiratory issues).    Yes [provider]  carvedilol (COREG) 12.5 MG tablet Take 12.5 mg by mouth 2 (two) times daily with a meal.   Yes [provider]  cinacalcet (SENSIPAR) 60 MG tablet Take 60 mg by mouth daily. Pt. Gets at dialysis 01/06/18  Yes [provider]  cloNIDine (CATAPRES) 0.2 MG tablet Take 1 tablet (0.2 mg total) by mouth 3 (three) times daily. 09/16/17  Yes Love, Ivan Anchors, PA-C  clopidogrel (PLAVIX) 75 MG tablet Take 75 mg by mouth daily.   Yes [provider]  diazepam (VALIUM) 5 MG tablet Take 5 mg by mouth 2 (two) times daily as needed for anxiety.   Yes [provider]  fluticasone (FLONASE) 50 MCG/ACT nasal spray Place 1 spray into both nostrils daily as needed for allergies or rhinitis.   Yes [provider]  gabapentin (NEURONTIN) 100 MG capsule TAKE 1 CAPSULE (100 MG TOTAL) BY  MOUTH AT BEDTIME. WHEN NECESSARY FOR NEUROPATHY PAIN 01/11/18  Yes Newt Minion, MD  hydrALAZINE (APRESOLINE) 25 MG tablet Take 25 mg by mouth daily. 01/17/18  Yes [provider]  hydrocerin (EUCERIN) CREA Apply 893 application topically 2 (two) times daily. Patient taking differently: Apply 1 application topically 2 (two) times daily.  09/16/17  Yes Love, Ivan Anchors, PA-C  insulin regular (NOVOLIN R,HUMULIN R) 100 units/mL injection Inject 3-8 Units into the skin 3 (three) times daily before meals. Per sliding scale on  non-dialysis days   Yes [provider]  LEVEMIR FLEXTOUCH 100 UNIT/ML Pen Inject 7 Units into the skin at bedtime. 09/16/17  Yes Love, Ivan Anchors, PA-C  lidocaine, PF, (XYLOCAINE) 1 % SOLN injection Inject 5 mLs into the skin as needed (topical anesthesia for hemodialysis ifGEBAUERS is ineffective.). 01/23/18  Yes Allie Bossier, MD  lidocaine-prilocaine (EMLA) cream Apply 1 application topically 3 (three) times a week. 1-2 hours prior to dialysis 01/07/18  Yes [provider]  loratadine (CLARITIN) 10 MG tablet Take 10 mg by mouth daily as needed for allergies.   Yes [provider]  multivitamin (RENA-VIT) TABS tablet Take 1 tablet by mouth daily.  01/24/15  Yes [provider]  omeprazole (PRILOSEC) 20 MG capsule Take 20 mg by mouth daily.    Yes [provider]  oxyCODONE-acetaminophen (PERCOCET/ROXICET) 5-325 MG tablet Take 1 tablet by mouth every 4 (four) hours as needed for severe pain. 12/31/17  Yes Newt Minion, MD  polyethylene glycol [facility] / GLYCOLAX) packet Take 17 g by mouth daily. Patient taking differently: Take 17 g by mouth daily as needed for moderate constipation.  09/17/17  Yes Love, Ivan Anchors, PA-C  Amino Acids-Protein Hydrolys (FEEDING SUPPLEMENT, PRO-STAT SUGAR FREE 64,) LIQD Take 30 mLs by mouth 2 (two) times daily. Patient not taking: Reported on 02/21/2018 09/16/17   Love, Ivan Anchors, PA-C  Dakins (SODIUM  HYPOCHLORITE) external solution Apply topically daily. Cleanse groin and cover with damp gauze soaked in dakins. Patient not taking: Reported on 02/21/2018 09/16/17   Love, Ivan Anchors, PA-C  doxycycline (VIBRA-TABS) 100 MG tablet Take 1 tablet (100 mg total) by mouth 2 (two) times daily. Patient not taking: Reported on 02/21/2018 12/23/17   Newt Minion, MD  insulin NPH Human (HUMULIN N,NOVOLIN N) 100 UNIT/ML injection Inject 0.1 mLs (10 Units total) into the skin at bedtime. Patient not taking: Reported on 02/21/2018 01/23/18   Allie Bossier, MD    Family History Family History  Problem Relation Age of Onset  . Other Mother        not sure of cause of death  . Diabetes Father   . Pancreatic cancer Maternal Grandmother   . Colon cancer Neg Hx     Social History Social History   Tobacco Use  . Smoking status: Former Smoker    Packs/day: 0.35    Years: 40.00    Pack years: 14.00    Types: Cigarettes    Last attempt to quit: 05/10/2012    Years since quitting: 5.7  . Smokeless tobacco: Never Used  Substance Use Topics  . Alcohol use: No  . Drug use: No    Comment: marijuana; quit in early 1980's     Allergies   Prednisone and Lisinopril   Review of Systems Review of Systems  Constitutional: Negative for chills and fever.  HENT: Negative for facial swelling and sore throat.   Respiratory: Positive for shortness of breath.   Cardiovascular: Negative for chest pain and leg swelling.  Gastrointestinal: Negative for abdominal pain, nausea and vomiting.  Genitourinary: Negative for dysuria.  Musculoskeletal: Negative for back pain.  Skin: Negative for rash and wound.  Neurological: Negative for headaches.  Psychiatric/Behavioral: The patient is not nervous/anxious.      Physical Exam Updated Vital Signs BP (!) 163/74   Pulse 83   Temp 98.4 F (36.9 C) (Oral)   Resp 15   SpO2 100%   Physical Exam  Constitutional: She appears well-developed and  well-nourished. No  distress.  HENT:  Head: Normocephalic and atraumatic.  Mouth/Throat: Oropharynx is clear and moist. No oropharyngeal exudate.  Eyes: Pupils are equal, round, and reactive to light. Conjunctivae are normal. Right eye exhibits no discharge. Left eye exhibits no discharge. No scleral icterus.  Neck: Normal range of motion. Neck supple. No thyromegaly present.  Cardiovascular: Normal rate, regular rhythm, normal heart sounds and intact distal pulses. Exam reveals no gallop and no friction rub.  No murmur heard. Pulmonary/Chest: Effort normal. No stridor. No respiratory distress. She has decreased breath sounds. She has no wheezes. She has rales (bilateral bases).  Abdominal: Soft. Bowel sounds are normal. She exhibits no distension. There is no tenderness. There is no rebound and no guarding.  Musculoskeletal: She exhibits no edema.  Lymphadenopathy:    She has no cervical adenopathy.  Neurological: She is alert. Coordination normal.  Skin: Skin is warm and dry. No rash noted. She is not diaphoretic. No pallor.  Psychiatric: She has a normal mood and affect.  Nursing note and vitals reviewed.    ED Treatments / Results  Labs (all labs ordered are listed, but only abnormal results are displayed) Labs Reviewed  BASIC METABOLIC PANEL - Abnormal; Notable for the following components:      Result Value   Sodium 134 (*)    Chloride 95 (*)    Glucose, Bld 136 (*)    BUN 38 (*)    Creatinine, Ser 6.05 (*)    GFR calc non Af Amer 7 (*)    GFR calc Af Amer 8 (*)    All other components within normal limits  CBC WITH DIFFERENTIAL/PLATELET  I-STAT TROPONIN, ED    EKG EKG Interpretation  Date/Time:  Monday February 21 2018 03:50:07 EDT Ventricular Rate:  101 PR Interval:    QRS Duration: 90 QT Interval:  369 QTC Calculation: 479 R Axis:   22 Text Interpretation:  Sinus tachycardia Probable left atrial enlargement Anteroseptal infarct, age indeterminate Minimal ST elevation, inferior leads  When compared with ECG of 08/23/2017, No significant change was found Confirmed by Delora Fuel ([id number]) on 02/21/2018 4:00:09 AM Also confirmed by Delora Fuel ([id number]), editor Hattie Perch [phone])  on 02/21/2018 6:48:29 AM   Radiology Dg Chest Portable 1 View  Result Date: 02/21/2018 CLINICAL DATA:  Shortness of breath tonight. EXAM: PORTABLE CHEST 1 VIEW COMPARISON:  Radiograph 01/21/2018 FINDINGS: Cardiomegaly with slight improvement from prior exam. Mild pulmonary edema with slight improvement from prior exam. Small bilateral pleural effusions. No new airspace disease. No pneumothorax. Chronic change about both shoulders. IMPRESSION: Persistent but improved cardiomegaly, mild pulmonary edema and small pleural effusions since exam last month. Electronically Signed   By: Jeb Levering M.D.   On: 02/21/2018 04:55    Procedures Procedures (including critical care time)  Medications Ordered in ED Medications  hydrALAZINE (APRESOLINE) injection 10 mg (has no administration in time range)  Chlorhexidine Gluconate Cloth 2 % PADS 6 each (has no administration in time range)     Initial Impression / Assessment and Plan / ED Course  I have reviewed the triage vital signs and the nursing notes.  Pertinent labs & imaging results that were available during my care of the patient were reviewed by me and considered in my medical decision making (see chart for details).     Patient with pulmonary edema and small pleural effusions on chest x-ray.  Patient with stable vitals.  Labs are stable.  Nephrology consulted, Dr. Jonnie Finner, and advises dialysis  and discharge home.  Patient placed on BiPAP in the interim with good relief of symptoms.  Patient also evaluated by Dr. Kathrynn Humble who guided the patient's management and agrees with plan.  Final Clinical Impressions(s) / ED Diagnoses   Final diagnoses:  Shortness of breath  Acute pulmonary edema [facility])    ED Discharge Orders    None         Frederica Kuster, PA-C 02/21/18 0747    Varney Biles, MD 02/22/18 2317

## 2018-02-22 DIAGNOSIS — Z4781 Encounter for orthopedic aftercare following surgical amputation: Secondary | ICD-10-CM | POA: Diagnosis not present

## 2018-02-22 DIAGNOSIS — Z794 Long term (current) use of insulin: Secondary | ICD-10-CM | POA: Diagnosis not present

## 2018-02-22 DIAGNOSIS — E1151 Type 2 diabetes mellitus with diabetic peripheral angiopathy without gangrene: Secondary | ICD-10-CM | POA: Diagnosis not present

## 2018-02-22 DIAGNOSIS — E1122 Type 2 diabetes mellitus with diabetic chronic kidney disease: Secondary | ICD-10-CM | POA: Diagnosis not present

## 2018-02-22 DIAGNOSIS — Z89421 Acquired absence of other right toe(s): Secondary | ICD-10-CM | POA: Diagnosis not present

## 2018-02-22 DIAGNOSIS — N186 End stage renal disease: Secondary | ICD-10-CM | POA: Diagnosis not present

## 2018-02-23 DIAGNOSIS — N2581 Secondary hyperparathyroidism of renal origin: Secondary | ICD-10-CM | POA: Diagnosis not present

## 2018-02-23 DIAGNOSIS — D509 Iron deficiency anemia, unspecified: Secondary | ICD-10-CM | POA: Diagnosis not present

## 2018-02-23 DIAGNOSIS — D631 Anemia in chronic kidney disease: Secondary | ICD-10-CM | POA: Diagnosis not present

## 2018-02-23 DIAGNOSIS — E1122 Type 2 diabetes mellitus with diabetic chronic kidney disease: Secondary | ICD-10-CM | POA: Diagnosis not present

## 2018-02-23 DIAGNOSIS — E876 Hypokalemia: Secondary | ICD-10-CM | POA: Diagnosis not present

## 2018-02-23 DIAGNOSIS — N186 End stage renal disease: Secondary | ICD-10-CM | POA: Diagnosis not present

## 2018-02-24 DIAGNOSIS — N186 End stage renal disease: Secondary | ICD-10-CM | POA: Diagnosis not present

## 2018-02-24 DIAGNOSIS — I159 Secondary hypertension, unspecified: Secondary | ICD-10-CM | POA: Diagnosis not present

## 2018-02-24 DIAGNOSIS — Z992 Dependence on renal dialysis: Secondary | ICD-10-CM | POA: Diagnosis not present

## 2018-02-25 DIAGNOSIS — E876 Hypokalemia: Secondary | ICD-10-CM | POA: Diagnosis not present

## 2018-02-25 DIAGNOSIS — N186 End stage renal disease: Secondary | ICD-10-CM | POA: Diagnosis not present

## 2018-02-25 DIAGNOSIS — N2581 Secondary hyperparathyroidism of renal origin: Secondary | ICD-10-CM | POA: Diagnosis not present

## 2018-02-25 DIAGNOSIS — E1122 Type 2 diabetes mellitus with diabetic chronic kidney disease: Secondary | ICD-10-CM | POA: Diagnosis not present

## 2018-02-28 DIAGNOSIS — E876 Hypokalemia: Secondary | ICD-10-CM | POA: Diagnosis not present

## 2018-02-28 DIAGNOSIS — N2581 Secondary hyperparathyroidism of renal origin: Secondary | ICD-10-CM | POA: Diagnosis not present

## 2018-02-28 DIAGNOSIS — E1122 Type 2 diabetes mellitus with diabetic chronic kidney disease: Secondary | ICD-10-CM | POA: Diagnosis not present

## 2018-02-28 DIAGNOSIS — N186 End stage renal disease: Secondary | ICD-10-CM | POA: Diagnosis not present

## 2018-03-02 DIAGNOSIS — E876 Hypokalemia: Secondary | ICD-10-CM | POA: Diagnosis not present

## 2018-03-02 DIAGNOSIS — E1122 Type 2 diabetes mellitus with diabetic chronic kidney disease: Secondary | ICD-10-CM | POA: Diagnosis not present

## 2018-03-02 DIAGNOSIS — N186 End stage renal disease: Secondary | ICD-10-CM | POA: Diagnosis not present

## 2018-03-02 DIAGNOSIS — N2581 Secondary hyperparathyroidism of renal origin: Secondary | ICD-10-CM | POA: Diagnosis not present

## 2018-03-03 ENCOUNTER — Encounter (INDEPENDENT_AMBULATORY_CARE_PROVIDER_SITE_OTHER): Payer: Self-pay | Admitting: Orthopedic Surgery

## 2018-03-03 ENCOUNTER — Ambulatory Visit (INDEPENDENT_AMBULATORY_CARE_PROVIDER_SITE_OTHER): Payer: Medicare Other | Admitting: Orthopedic Surgery

## 2018-03-03 VITALS — Ht 65.0 in | Wt 134.0 lb

## 2018-03-03 DIAGNOSIS — Z89431 Acquired absence of right foot: Secondary | ICD-10-CM | POA: Diagnosis not present

## 2018-03-04 DIAGNOSIS — N2581 Secondary hyperparathyroidism of renal origin: Secondary | ICD-10-CM | POA: Diagnosis not present

## 2018-03-04 DIAGNOSIS — E876 Hypokalemia: Secondary | ICD-10-CM | POA: Diagnosis not present

## 2018-03-04 DIAGNOSIS — E1122 Type 2 diabetes mellitus with diabetic chronic kidney disease: Secondary | ICD-10-CM | POA: Diagnosis not present

## 2018-03-04 DIAGNOSIS — N186 End stage renal disease: Secondary | ICD-10-CM | POA: Diagnosis not present

## 2018-03-07 DIAGNOSIS — E1122 Type 2 diabetes mellitus with diabetic chronic kidney disease: Secondary | ICD-10-CM | POA: Diagnosis not present

## 2018-03-07 DIAGNOSIS — N2581 Secondary hyperparathyroidism of renal origin: Secondary | ICD-10-CM | POA: Diagnosis not present

## 2018-03-07 DIAGNOSIS — E876 Hypokalemia: Secondary | ICD-10-CM | POA: Diagnosis not present

## 2018-03-07 DIAGNOSIS — N186 End stage renal disease: Secondary | ICD-10-CM | POA: Diagnosis not present

## 2018-03-09 DIAGNOSIS — N186 End stage renal disease: Secondary | ICD-10-CM | POA: Diagnosis not present

## 2018-03-09 DIAGNOSIS — E1122 Type 2 diabetes mellitus with diabetic chronic kidney disease: Secondary | ICD-10-CM | POA: Diagnosis not present

## 2018-03-09 DIAGNOSIS — N2581 Secondary hyperparathyroidism of renal origin: Secondary | ICD-10-CM | POA: Diagnosis not present

## 2018-03-09 DIAGNOSIS — E876 Hypokalemia: Secondary | ICD-10-CM | POA: Diagnosis not present

## 2018-03-10 DIAGNOSIS — I1311 Hypertensive heart and chronic kidney disease without heart failure, with stage 5 chronic kidney disease, or end stage renal disease: Secondary | ICD-10-CM | POA: Diagnosis not present

## 2018-03-10 DIAGNOSIS — N186 End stage renal disease: Secondary | ICD-10-CM | POA: Diagnosis not present

## 2018-03-10 DIAGNOSIS — Z992 Dependence on renal dialysis: Secondary | ICD-10-CM | POA: Diagnosis not present

## 2018-03-10 DIAGNOSIS — E1322 Other specified diabetes mellitus with diabetic chronic kidney disease: Secondary | ICD-10-CM | POA: Diagnosis not present

## 2018-03-11 DIAGNOSIS — N186 End stage renal disease: Secondary | ICD-10-CM | POA: Diagnosis not present

## 2018-03-11 DIAGNOSIS — E876 Hypokalemia: Secondary | ICD-10-CM | POA: Diagnosis not present

## 2018-03-11 DIAGNOSIS — E1122 Type 2 diabetes mellitus with diabetic chronic kidney disease: Secondary | ICD-10-CM | POA: Diagnosis not present

## 2018-03-11 DIAGNOSIS — N2581 Secondary hyperparathyroidism of renal origin: Secondary | ICD-10-CM | POA: Diagnosis not present

## 2018-03-14 DIAGNOSIS — N186 End stage renal disease: Secondary | ICD-10-CM | POA: Diagnosis not present

## 2018-03-14 DIAGNOSIS — N2581 Secondary hyperparathyroidism of renal origin: Secondary | ICD-10-CM | POA: Diagnosis not present

## 2018-03-14 DIAGNOSIS — E1122 Type 2 diabetes mellitus with diabetic chronic kidney disease: Secondary | ICD-10-CM | POA: Diagnosis not present

## 2018-03-14 DIAGNOSIS — E876 Hypokalemia: Secondary | ICD-10-CM | POA: Diagnosis not present

## 2018-03-16 DIAGNOSIS — N186 End stage renal disease: Secondary | ICD-10-CM | POA: Diagnosis not present

## 2018-03-16 DIAGNOSIS — E876 Hypokalemia: Secondary | ICD-10-CM | POA: Diagnosis not present

## 2018-03-16 DIAGNOSIS — N2581 Secondary hyperparathyroidism of renal origin: Secondary | ICD-10-CM | POA: Diagnosis not present

## 2018-03-16 DIAGNOSIS — E1122 Type 2 diabetes mellitus with diabetic chronic kidney disease: Secondary | ICD-10-CM | POA: Diagnosis not present

## 2018-03-17 ENCOUNTER — Other Ambulatory Visit: Payer: Self-pay | Admitting: Surgery

## 2018-03-17 ENCOUNTER — Telehealth: Payer: Self-pay | Admitting: *Deleted

## 2018-03-17 NOTE — Telephone Encounter (Signed)
Received fax request from Dr. Marval Regal [facility]) for Destiny Day to do a left arm shuntogram on this patient due to decreased blood flow and extended bleeding after cannulation. This procedure will be set up on Tuesday 03-22-18 with Dr. Rodney Booze at [facility] will instruct patient and give the arrival time.

## 2018-03-18 DIAGNOSIS — N186 End stage renal disease: Secondary | ICD-10-CM | POA: Diagnosis not present

## 2018-03-18 DIAGNOSIS — E876 Hypokalemia: Secondary | ICD-10-CM | POA: Diagnosis not present

## 2018-03-18 DIAGNOSIS — E1122 Type 2 diabetes mellitus with diabetic chronic kidney disease: Secondary | ICD-10-CM | POA: Diagnosis not present

## 2018-03-18 DIAGNOSIS — N2581 Secondary hyperparathyroidism of renal origin: Secondary | ICD-10-CM | POA: Diagnosis not present

## 2018-03-21 ENCOUNTER — Encounter: Payer: Self-pay | Admitting: *Deleted

## 2018-03-21 DIAGNOSIS — E1122 Type 2 diabetes mellitus with diabetic chronic kidney disease: Secondary | ICD-10-CM | POA: Diagnosis not present

## 2018-03-21 DIAGNOSIS — N2581 Secondary hyperparathyroidism of renal origin: Secondary | ICD-10-CM | POA: Diagnosis not present

## 2018-03-21 DIAGNOSIS — N186 End stage renal disease: Secondary | ICD-10-CM | POA: Diagnosis not present

## 2018-03-21 DIAGNOSIS — E876 Hypokalemia: Secondary | ICD-10-CM | POA: Diagnosis not present

## 2018-03-22 ENCOUNTER — Other Ambulatory Visit: Payer: Self-pay

## 2018-03-22 ENCOUNTER — Encounter (HOSPITAL_COMMUNITY)
Admission: RE | Disposition: A | Discharge status: Home / Self Care | Payer: Self-pay | Source: Ambulatory Visit | Attending: Surgery

## 2018-03-22 ENCOUNTER — Encounter (INDEPENDENT_AMBULATORY_CARE_PROVIDER_SITE_OTHER): Payer: Self-pay | Admitting: Orthopedic Surgery

## 2018-03-22 ENCOUNTER — Ambulatory Visit (HOSPITAL_COMMUNITY)
Admission: RE | Admit: 2018-03-22 | Discharge: 2018-03-22 | Disposition: A | Discharge status: Home / Self Care | Payer: Medicare Other | Source: Ambulatory Visit | Attending: Surgery | Admitting: Surgery

## 2018-03-22 DIAGNOSIS — Y832 Surgical operation with anastomosis, bypass or graft as the cause of abnormal reaction of the patient, or of later complication, without mention of misadventure at the time of the procedure: Secondary | ICD-10-CM | POA: Insufficient documentation

## 2018-03-22 DIAGNOSIS — T82898A Other specified complication of vascular prosthetic devices, implants and grafts, initial encounter: Secondary | ICD-10-CM | POA: Diagnosis not present

## 2018-03-22 DIAGNOSIS — N186 End stage renal disease: Secondary | ICD-10-CM | POA: Diagnosis not present

## 2018-03-22 DIAGNOSIS — Z992 Dependence on renal dialysis: Secondary | ICD-10-CM | POA: Diagnosis not present

## 2018-03-22 HISTORY — PX: PERIPHERAL VASCULAR BALLOON ANGIOPLASTY: CATH118281

## 2018-03-22 HISTORY — PX: A/V SHUNTOGRAM: CATH118297

## 2018-03-22 LAB — POCT I-STAT, CHEM 8
BUN: 34 mg/dL — ABNORMAL HIGH (ref 8–23)
Calcium, Ion: 1.13 mmol/L — ABNORMAL LOW (ref 1.15–1.40)
Chloride: 93 mmol/L — ABNORMAL LOW (ref 98–111)
Creatinine, Ser: 5 mg/dL — ABNORMAL HIGH (ref 0.44–1.00)
Glucose, Bld: 99 mg/dL (ref 70–99)
HCT: 46 % (ref 36.0–46.0)
Hemoglobin: 15.6 g/dL — ABNORMAL HIGH (ref 12.0–15.0)
Potassium: 4.3 mmol/L (ref 3.5–5.1)
Sodium: 134 mmol/L — ABNORMAL LOW (ref 135–145)
TCO2: 30 mmol/L (ref 22–32)

## 2018-03-22 LAB — GLUCOSE, CAPILLARY: Glucose-Capillary: 80 mg/dL (ref 70–99)

## 2018-03-22 SURGERY — A/V SHUNTOGRAM
Anesthesia: LOCAL

## 2018-03-22 MED ORDER — LIDOCAINE HCL (PF) 1 % IJ SOLN
INTRAMUSCULAR | Status: AC
  Filled 2018-03-22: qty 30

## 2018-03-22 MED ORDER — FENTANYL CITRATE (PF) 100 MCG/2ML IJ SOLN
INTRAMUSCULAR | Status: AC
  Filled 2018-03-22: qty 2

## 2018-03-22 MED ORDER — MIDAZOLAM HCL 2 MG/2ML IJ SOLN
INTRAMUSCULAR | Status: AC
  Filled 2018-03-22: qty 2

## 2018-03-22 MED ORDER — SODIUM CHLORIDE 0.9% FLUSH: 3.0000 mL | Freq: Two times a day (BID) | INTRAVENOUS | Status: DC

## 2018-03-22 MED ORDER — IODIXANOL 320 MG/ML IV SOLN
INTRAVENOUS | Status: DC | PRN
  Administered 2018-03-22: 30 mL via INTRAVENOUS

## 2018-03-22 MED ORDER — MIDAZOLAM HCL 2 MG/2ML IJ SOLN
INTRAMUSCULAR | Status: DC | PRN
  Administered 2018-03-22 (×2): 1 mg via INTRAVENOUS

## 2018-03-22 MED ORDER — HEPARIN (PORCINE) IN NACL 1000-0.9 UT/500ML-% IV SOLN
INTRAVENOUS | Status: AC
  Filled 2018-03-22: qty 500

## 2018-03-22 MED ORDER — HEPARIN (PORCINE) IN NACL 1000-0.9 UT/500ML-% IV SOLN
INTRAVENOUS | Status: DC | PRN
  Administered 2018-03-22: 500 mL

## 2018-03-22 MED ORDER — SODIUM CHLORIDE 0.9 % IV SOLN: 250.0000 mL | INTRAVENOUS | Status: DC | PRN

## 2018-03-22 MED ORDER — FENTANYL CITRATE (PF) 100 MCG/2ML IJ SOLN
INTRAMUSCULAR | Status: DC | PRN
  Administered 2018-03-22 (×2): 25 ug via INTRAVENOUS

## 2018-03-22 MED ORDER — OXYCODONE-ACETAMINOPHEN 5-325 MG PO TABS: 2.0000 | ORAL_TABLET | Freq: Four times a day (QID) | ORAL | Status: DC | PRN

## 2018-03-22 MED ORDER — SODIUM CHLORIDE 0.9% FLUSH: 3.0000 mL | INTRAVENOUS | Status: DC | PRN

## 2018-03-22 MED ORDER — LIDOCAINE HCL (PF) 1 % IJ SOLN
INTRAMUSCULAR | Status: DC | PRN
  Administered 2018-03-22: 10 mL

## 2018-03-22 SURGICAL SUPPLY — 14 items
BAG SNAP BAND KOVER 36X36 (MISCELLANEOUS) ×3 IMPLANT
BALLN LUTONIX AV 7X40X75 (BALLOONS) ×3
BALLOON LUTONIX AV 7X40X75 (BALLOONS) ×2 IMPLANT
COVER DOME SNAP 22 D (MISCELLANEOUS) ×3 IMPLANT
KIT ENCORE 26 ADVANTAGE (KITS) ×3 IMPLANT
KIT MICROPUNCTURE NIT STIFF (SHEATH) ×3 IMPLANT
PROTECTION STATION PRESSURIZED (MISCELLANEOUS) ×3
SHEATH PINNACLE R/O II 6F 4CM (SHEATH) ×3 IMPLANT
SHEATH PROBE COVER 6X72 (BAG) ×6 IMPLANT
STATION PROTECTION PRESSURIZED (MISCELLANEOUS) ×2 IMPLANT
STOPCOCK MORSE 400PSI 3WAY (MISCELLANEOUS) ×6 IMPLANT
TRAY PV CATH (CUSTOM PROCEDURE TRAY) ×3 IMPLANT
TUBING CIL FLEX 10 FLL-RA (TUBING) ×6 IMPLANT
WIRE BENTSON .035X145CM (WIRE) ×3 IMPLANT

## 2018-03-22 NOTE — Progress Notes (Signed)
Spoke with Destiny Day, [place] per family request about pain meds after procedure. Pt family states that she doesn't have any more percocet at home and was asking if she needed more percocet. No need for percocet after procedure per Destiny Thurmond Butts, PA family and pt informed.

## 2018-03-22 NOTE — Discharge Instructions (Signed)

## 2018-03-22 NOTE — Op Note (Signed)
-       Patient name: Destiny Day MRN: [id number] DOB: [birth year] Sex: female    03/22/2018 Pre-operative Diagnosis: ESRD Post-operative diagnosis:  Same Surgeon:  Annamarie Major Procedure Performed:  1.  Ultrasound-guided access, left arm AV GG  2.  Shuntogram  3.  Venoplasty, venous outflow  4.  Conscious sedation (25 minutes)   Indications: Patient is having difficulty with dialysis flow rates.  She is here today for further evaluation  Procedure:  The patient was identified in the holding area and taken to room 8.  The patient was then placed supine on the table and prepped and draped in the usual sterile fashion.  A time out was called.  Conscious sedation was administered with use of IV fentanyl and Versed under continuous physician and nurse monitoring.  Heart rate, blood pressure, and oxygen saturation were continuously monitored.  Ultrasound was used to evaluate the fistula.  The vein was patent and compressible.  A digital ultrasound image was acquired.  The fistula was then accessed under ultrasound guidance using a micropuncture needle.  An 018 wire was then asvanced without resistance and a micropuncture sheath was placed.  Contrast injections were then performed through the sheath.  Findings: The central venous system is widely patent.  The arterial venous anastomosis is widely patent.  The dialysis graft is patent throughout its course.  There was mild luminal irregularity at the venous anastomosis.   Intervention: After the above images were acquired, the decision was made to proceed with intervention.  The patient had similar findings on previous studies and venoplasty of the venous outflow tract yielded a improved benefit, and therefore elected to proceed again.  Over a 035 Bentson wire, a 6 French sheath was placed.  I selected a 7 x 40 Lutonix balloon and performed balloon venoplasty of the venous anastomosis.  Follow-up imaging revealed no residual stenosis.  The  sheath was removed with suture closure.  There were no immediate complications  Impression:  #1  No significant central venous stenosis or arterial anastomotic stenosis  #2  Mild luminal irregularity at the venous outflow tract which has been seen on prior studies.  Previously, after intervention, the patient had significant benefit, therefore I elected to again to treat the venous outflow tract with a 7 x 40 drug-coated Lutonix balloon.   Theotis Burrow, M.D. [facility] Office: [phone] Pager:  [phone]

## 2018-03-22 NOTE — Progress Notes (Signed)
Office Visit Note   Patient: Destiny Day           Date of Birth: 04/11/1951           MRN: [id number] Visit Date: 03/03/2018              Requested by: Velna Hatchet, MD [address] [place], [place] [address] PCP: Velna Hatchet, MD  Chief Complaint  Patient presents with  . Right Foot - Routine Post Op    12/31/17 right transmet amputation       HPI: Patient is a 67 year old woman who is 2 months status post right transmetatarsal amputation.  She is ambulating with a walker has new balance shoes with orthotics.  She is wearing compression stockings and double upright brace.  Assessment & Plan: Visit Diagnoses:  1. 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[place])     Plan: Patient will continue with the brace and orthotics continue with her compression stockings for the venous insufficiency follow-up in 3 months  Follow-Up Instructions: Return in about 3 months (around 06/03/2018).   Ortho Exam  Patient is alert, oriented, no adenopathy, well-dressed, normal affect, normal respiratory effort. Examination the foot is plantigrade there are no ulcers the incision is well-healed there is brawny skin color changes with venous swelling but no open ulcers  Imaging: No results found. No images are attached to the encounter.  Labs: Lab Results  Component Value Date   HGBA1C 6.4 (H) 12/31/2017   HGBA1C 5.0 09/03/2017   HGBA1C 7.0 (H) 08/11/2017   REPTSTATUS 08/25/2017 FINAL 08/24/2017   CULT NO GROWTH 08/24/2017   LABORGA STAPHYLOCOCCUS AUREUS 11/29/2008     Lab Results  Component Value Date   ALBUMIN 2.7 (L) 01/22/2018   ALBUMIN 2.5 (L) 09/13/2017   ALBUMIN 2.5 (L) 09/10/2017    Body mass index is 22.3 kg/m.  Orders:  No orders of the defined types were placed in this encounter.  No orders of the defined types were placed in this encounter.    Procedures: No procedures performed  Clinical Data: No additional findings.  ROS:  All  other systems negative, except as noted in the HPI. Review of Systems  Objective: Vital Signs: Ht 5\' 5"  (1.651 m)   Wt 134 lb (60.8 kg)   BMI 22.30 kg/m   Specialty Comments:  No specialty comments available.  PMFS History: Patient Active Problem List   Diagnosis Date Noted  . Acute pulmonary edema ([place]) 02/21/2018  . 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[place]) 02/03/2018  . End-stage renal disease on hemodialysis ([place])   . Respiratory failure ([place]) 01/21/2018  . Achilles tendon contracture, right 11/09/2017  . Labile blood glucose   . Anemia of infection and chronic disease   . Essential hypertension   . Black stool   . Right sided weakness   . Subdural hemorrhage following injury ([place]) 08/30/2017  . Diabetic peripheral neuropathy ([place])   . Chronic obstructive pulmonary disease ([place])   . Seizure prophylaxis   . Subdural hematoma ([place]) 08/24/2017  . Traumatic subdural hematoma ([place]) 08/23/2017  . Fall   . SDH (subdural hematoma) ([place])   . Acute respiratory failure with hypoxia ([place])   . Diabetes mellitus type 2 in nonobese (HCC)   . Leukocytosis   . Labile blood pressure   . Generalized anxiety disorder   . Debility 08/16/2017  . Amputated great toe of right foot ([place])   . Amputated toe of left foot ([place])   .  Post-operative pain   . ESRD on dialysis ([place])   . Diabetes mellitus ([place])   . Anxiety state   . Benign essential HTN   . Acute blood loss anemia   . Anemia of chronic disease   . Idiopathic chronic venous hypertension of both lower extremities with inflammation 10/13/2016  . S/P transmetatarsal amputation of foot, left ([place]) 09/21/2016  . Onychomycosis 08/15/2016  . PAD (peripheral artery disease) ([place]) 07/08/2016  . Atherosclerosis of native artery of left lower extremity with gangrene ([place]) 06/23/2016  . GERD (gastroesophageal reflux disease) 10/07/2015  . ARF (acute renal failure) ([place])   . Hypothermia 06/12/2015  . Acute on chronic renal  failure ([place]) 06/12/2015  . CHF (congestive heart failure) ([place]) 07/30/2014  . CKD (chronic kidney disease) stage 4, GFR 15-29 ml/min (HCC) 06/27/2014  . Hypertensive heart disease 05/25/2013  . CKD (chronic kidney disease) stage 3, GFR 30-59 ml/min (HCC) 05/25/2013  . Pulmonary edema 05/23/2013  . Type 2 diabetes mellitus with diabetic nephropathy ([place]) 05/23/2013  . Type 2 diabetes mellitus with hyperosmolar nonketotic hyperglycemia ([place]) 04/13/2013  . Chronic diastolic CHF (congestive heart failure) ([place]) 04/13/2013  . UGI bleed 03/31/2013  . Hypokalemia 08/24/2012  . Type II or unspecified type diabetes mellitus without mention of complication, not stated as uncontrolled 12/27/2007  . HLD (hyperlipidemia) 12/27/2007  . ALLERGIC RHINITIS 12/27/2007  . Asthma 12/27/2007   Past Medical History:  Diagnosis Date  . Abdominal bruit   . Anemia   . Anxiety   . Arthritis    Osteoarthritis  . Asthma   . Cervical disc disease    "pinced nerve"  . CHF (congestive heart failure) ([place])   . Complication of anesthesia    " after I got home from my last procedure, I started itching."  . Diabetes mellitus    Type II  . Diverticulitis   . ESRD on peritoneal dialysis [facility])    Hemodialysis - MWF- [place]   . GERD (gastroesophageal reflux disease)    from medications  . GI bleed 03/31/2013  . History of hiatal hernia   . Hyperlipidemia   . Hypertension   . Neuropathy    left leg  . Osteoporosis   . Peripheral vascular disease ([place])   . Pneumonia    "very young"  . Seasonal allergies   . Shortness of breath dyspnea    WIth exertion  . Sleep apnea    can't afford cpap    Family History  Problem Relation Age of Onset  . Other Mother        not sure of cause of death  . Diabetes Father   . Pancreatic cancer Maternal Grandmother   . Colon cancer Neg Hx     Past Surgical History:  Procedure Laterality Date  . A/V SHUNTOGRAM N/A 09/22/2016   Procedure: A/V Shuntogram - left arm;   Surgeon: Serafina Mitchell, MD;  Location: [facility];  Service: Cardiovascular;  Laterality: N/A;  . ABDOMINAL HYSTERECTOMY  1993`  . AMPUTATION Left 09/01/2016   Procedure: LEFT FOOT TRANSMETATARSAL AMPUTATION;  Surgeon: Newt Minion, MD;  Location: [facility];  Service: Orthopedics;  Laterality: Left;  . AMPUTATION Right 08/11/2017   Procedure: RIGHT GREAT TOE AMPUTATION DIGIT;  Surgeon: Rosetta Posner, MD;  Location: [facility];  Service: Vascular;  Laterality: Right;  . AMPUTATION Right 12/31/2017   Procedure: RIGHT TRANSMETATARSAL AMPUTATION;  Surgeon: Newt Minion, MD;  Location: [facility];  Service: Orthopedics;  Laterality: Right;  .  AV FISTULA PLACEMENT Left 04/21/2016   Procedure: INSERTION OF ARTERIOVENOUS (AV) GORE-TEX GRAFT ARM LEFT;  Surgeon: Elam Dutch, MD;  Location: [facility];  Service: Vascular;  Laterality: Left;  . [place] TRANSPOSITION Left 07/10/2014   Procedure: BASCILIC VEIN TRANSPOSITION;  Surgeon: Angelia Mould, MD;  Location: [facility];  Service: Vascular;  Laterality: Left;  . BASCILIC VEIN TRANSPOSITION Right 11/08/2014   Procedure: FIRST STAGE BASILIC VEIN TRANSPOSITION;  Surgeon: Angelia Mould, MD;  Location: [facility];  Service: Vascular;  Laterality: Right;  . BASCILIC VEIN TRANSPOSITION Right 01/18/2015   Procedure: SECOND STAGE BASILIC VEIN TRANSPOSITION;  Surgeon: Angelia Mould, MD;  Location: [facility];  Service: Vascular;  Laterality: Right;  . CRANIOTOMY N/A 08/23/2017   Procedure: CRANIOTOMY HEMATOMA EVACUATION SUBDURAL;  Surgeon: Ashok Pall, MD;  Location: [facility];  Service: Neurosurgery;  Laterality: N/A;  . CRANIOTOMY Left 08/24/2017   Procedure: CRANIOTOMY FOR RECURRENT ACUTE SUBDURAL HEMATOMA;  Surgeon: Ashok Pall, MD;  Location: [facility];  Service: Neurosurgery;  Laterality: Left;  . ESOPHAGOGASTRODUODENOSCOPY N/A 03/31/2013   Procedure: ESOPHAGOGASTRODUODENOSCOPY (EGD);  Surgeon: Gatha Mayer, MD;  Location[facility]OSCOPY;  Service: Endoscopy;   Laterality: N/A;  . EYE SURGERY     laser surgery  . FEMORAL-POPLITEAL BYPASS GRAFT Left 07/08/2016   Procedure: LEFT  FEMORAL-BELOW KNEE POPLITEAL ARTERY BYPASS GRAFT USING 6MM X 80 CM PROPATEN GORETEX GRAFT WITH RINGS.;  Surgeon: Rosetta Posner, MD;  Location: [facility];  Service: Vascular;  Laterality: Left;  . FEMORAL-POPLITEAL BYPASS GRAFT Right 08/11/2017   Procedure: RIGHT FEMORAL TO BELOW KNEE POPLITEAL ARTERKY  BYPASS GRAFT USING 6MM RINGED PROPATEN GRAFT;  Surgeon: Rosetta Posner, MD;  Location: [facility];  Service: Vascular;  Laterality: Right;  . FISTULOGRAM Left 10/29/2014   Procedure: FISTULOGRAM;  Surgeon: Angelia Mould, MD;  Location: [facility];  Service: Cardiovascular;  Laterality: Left;  . LIGATION OF ARTERIOVENOUS  FISTULA Left 04/21/2016   Procedure: LIGATION OF ARTERIOVENOUS  FISTULA LEFT ARM;  Surgeon: Elam Dutch, MD;  Location: [facility];  Service: Vascular;  Laterality: Left;  . LOWER EXTREMITY ANGIOGRAPHY N/A 04/06/2017   Procedure: Lower Extremity Angiography - Right;  Surgeon: Serafina Mitchell, MD;  Location: [facility];  Service: Cardiovascular;  Laterality: N/A;  . PATCH ANGIOPLASTY Right 01/18/2015   Procedure: BASILIC VEIN PATCH ANGIOPLASTY USING VASCUGUARD PATCH;  Surgeon: Angelia Mould, MD;  Location: [facility];  Service: Vascular;  Laterality: Right;  . PERIPHERAL VASCULAR BALLOON ANGIOPLASTY  09/22/2016   Procedure: Peripheral Vascular Balloon Angioplasty;  Surgeon: Serafina Mitchell, MD;  Location: [facility];  Service: Cardiovascular;;  Lt. Fistula  . PERIPHERAL VASCULAR CATHETERIZATION N/A 06/23/2016   Procedure: Abdominal Aortogram w/Lower Extremity;  Surgeon: Serafina Mitchell, MD;  Location: [facility];  Service: Cardiovascular;  Laterality: N/A;  . PERIPHERAL VASCULAR CATHETERIZATION  06/23/2016   Procedure: Peripheral Vascular Intervention;  Surgeon: Serafina Mitchell, MD;  Location: [facility];  Service: Cardiovascular;;  lt  common and external illiac artery   Social History   Occupational History  . Occupation: Retired  Tobacco Use  . Smoking status: Former Smoker    Packs/day: 0.35    Years: 40.00    Pack years: 14.00    Types: Cigarettes    Last attempt to quit: 05/10/2012    Years since quitting: 5.8  . Smokeless tobacco: Never Used  Substance and Sexual Activity  . Alcohol use:  No  . Drug use: No    Comment: marijuana; quit in early 1980's  . Sexual activity: Yes

## 2018-03-22 NOTE — H&P (Signed)
   Patient name: Destiny Day MRN: [id number] DOB: 1951/04/05 Sex: female  REASON FOR VISIT:     ESRD  HISTORY OF PRESENT ILLNESS:   Destiny Day is a 67 y.o. female with left arm AVGG, who is having trouble with HD.  CURRENT MEDICATIONS:    Current Facility-Administered Medications  Medication Dose Route Frequency Provider Last Rate Last Dose  . 0.9 %  sodium chloride infusion  250 mL Intravenous PRN Serafina Mitchell, MD      . sodium chloride flush (NS) 0.9 % injection 3 mL  3 mL Intravenous Q12H Serafina Mitchell, MD      . sodium chloride flush (NS) 0.9 % injection 3 mL  3 mL Intravenous PRN Serafina Mitchell, MD        REVIEW OF SYSTEMS:   [X]  denotes positive finding, [ ]  denotes negative finding Cardiac  Comments:  Chest pain or chest pressure:    Shortness of breath upon exertion:    Short of breath when lying flat:    Irregular heart rhythm:    Constitutional    Fever or chills:      PHYSICAL EXAM:   Vitals:   03/22/18 0953  BP: (!) 94/52  Pulse: 83  Temp: 98.4 F (36.9 C)  TempSrc: Oral  SpO2: 93%  Weight: 60.8 kg  Height: 5\' 5"  (1.651 m)    GENERAL: The patient is a well-nourished female, in no acute distress. The vital signs are documented above. CARDIOVASCULAR: There is a regular rate and rhythm. PULMONARY: Non-labored respirations  STUDIES:   none   MEDICAL ISSUES:   Plan for shuntogram and intervention as indicated  Annamarie Major, MD [facility] [phone] Pager [phone]

## 2018-03-22 NOTE — Progress Notes (Signed)
Dr Trula Slade called and asked when pt can be d/c home States  D/c around 1430

## 2018-03-23 ENCOUNTER — Encounter (HOSPITAL_COMMUNITY): Payer: Self-pay | Admitting: Surgery

## 2018-03-23 DIAGNOSIS — N2581 Secondary hyperparathyroidism of renal origin: Secondary | ICD-10-CM | POA: Diagnosis not present

## 2018-03-23 DIAGNOSIS — N186 End stage renal disease: Secondary | ICD-10-CM | POA: Diagnosis not present

## 2018-03-23 DIAGNOSIS — E876 Hypokalemia: Secondary | ICD-10-CM | POA: Diagnosis not present

## 2018-03-23 DIAGNOSIS — E1122 Type 2 diabetes mellitus with diabetic chronic kidney disease: Secondary | ICD-10-CM | POA: Diagnosis not present

## 2018-03-25 DIAGNOSIS — E876 Hypokalemia: Secondary | ICD-10-CM | POA: Diagnosis not present

## 2018-03-25 DIAGNOSIS — N186 End stage renal disease: Secondary | ICD-10-CM | POA: Diagnosis not present

## 2018-03-25 DIAGNOSIS — E1122 Type 2 diabetes mellitus with diabetic chronic kidney disease: Secondary | ICD-10-CM | POA: Diagnosis not present

## 2018-03-25 DIAGNOSIS — N2581 Secondary hyperparathyroidism of renal origin: Secondary | ICD-10-CM | POA: Diagnosis not present

## 2018-03-27 DIAGNOSIS — Z992 Dependence on renal dialysis: Secondary | ICD-10-CM | POA: Diagnosis not present

## 2018-03-27 DIAGNOSIS — N186 End stage renal disease: Secondary | ICD-10-CM | POA: Diagnosis not present

## 2018-03-27 DIAGNOSIS — I159 Secondary hypertension, unspecified: Secondary | ICD-10-CM | POA: Diagnosis not present

## 2018-03-28 DIAGNOSIS — E1122 Type 2 diabetes mellitus with diabetic chronic kidney disease: Secondary | ICD-10-CM | POA: Diagnosis not present

## 2018-03-28 DIAGNOSIS — Z23 Encounter for immunization: Secondary | ICD-10-CM | POA: Diagnosis not present

## 2018-03-28 DIAGNOSIS — N186 End stage renal disease: Secondary | ICD-10-CM | POA: Diagnosis not present

## 2018-03-28 DIAGNOSIS — E876 Hypokalemia: Secondary | ICD-10-CM | POA: Diagnosis not present

## 2018-03-28 DIAGNOSIS — N2581 Secondary hyperparathyroidism of renal origin: Secondary | ICD-10-CM | POA: Diagnosis not present

## 2018-03-29 DIAGNOSIS — H25013 Cortical age-related cataract, bilateral: Secondary | ICD-10-CM | POA: Diagnosis not present

## 2018-03-29 DIAGNOSIS — E113513 Type 2 diabetes mellitus with proliferative diabetic retinopathy with macular edema, bilateral: Secondary | ICD-10-CM | POA: Diagnosis not present

## 2018-03-29 DIAGNOSIS — H52203 Unspecified astigmatism, bilateral: Secondary | ICD-10-CM | POA: Diagnosis not present

## 2018-03-29 DIAGNOSIS — H2513 Age-related nuclear cataract, bilateral: Secondary | ICD-10-CM | POA: Diagnosis not present

## 2018-03-30 DIAGNOSIS — E1122 Type 2 diabetes mellitus with diabetic chronic kidney disease: Secondary | ICD-10-CM | POA: Diagnosis not present

## 2018-03-30 DIAGNOSIS — N186 End stage renal disease: Secondary | ICD-10-CM | POA: Diagnosis not present

## 2018-03-30 DIAGNOSIS — N2581 Secondary hyperparathyroidism of renal origin: Secondary | ICD-10-CM | POA: Diagnosis not present

## 2018-03-30 DIAGNOSIS — Z23 Encounter for immunization: Secondary | ICD-10-CM | POA: Diagnosis not present

## 2018-03-30 DIAGNOSIS — E876 Hypokalemia: Secondary | ICD-10-CM | POA: Diagnosis not present

## 2018-04-01 DIAGNOSIS — Z23 Encounter for immunization: Secondary | ICD-10-CM | POA: Diagnosis not present

## 2018-04-01 DIAGNOSIS — N186 End stage renal disease: Secondary | ICD-10-CM | POA: Diagnosis not present

## 2018-04-01 DIAGNOSIS — N2581 Secondary hyperparathyroidism of renal origin: Secondary | ICD-10-CM | POA: Diagnosis not present

## 2018-04-01 DIAGNOSIS — E1122 Type 2 diabetes mellitus with diabetic chronic kidney disease: Secondary | ICD-10-CM | POA: Diagnosis not present

## 2018-04-01 DIAGNOSIS — E876 Hypokalemia: Secondary | ICD-10-CM | POA: Diagnosis not present

## 2018-04-03 ENCOUNTER — Encounter (HOSPITAL_COMMUNITY): Payer: Self-pay

## 2018-04-03 ENCOUNTER — Ambulatory Visit (HOSPITAL_COMMUNITY)
Admission: EM | Admit: 2018-04-03 | Discharge: 2018-04-03 | Disposition: A | Discharge status: Home / Self Care | Payer: Medicare Other | Attending: Internal Medicine | Admitting: Internal Medicine

## 2018-04-03 DIAGNOSIS — R05 Cough: Secondary | ICD-10-CM

## 2018-04-03 DIAGNOSIS — R059 Cough, unspecified: Secondary | ICD-10-CM

## 2018-04-03 DIAGNOSIS — K219 Gastro-esophageal reflux disease without esophagitis: Secondary | ICD-10-CM

## 2018-04-03 DIAGNOSIS — Z9109 Other allergy status, other than to drugs and biological substances: Secondary | ICD-10-CM

## 2018-04-03 NOTE — Discharge Instructions (Signed)
Please use your Flonase daily.  Please use your symbicort daily.  May try adding in a zantac at night to further help with your reflux symptoms.  Continue with previously prescribed medications.  If persistent or no improvement please follow up with your primary care provider or your allergist for further management.

## 2018-04-03 NOTE — ED Provider Notes (Signed)
[place]    CSN: [id number] Arrival date & time: 04/03/18  1432     History   Chief Complaint Chief Complaint  Patient presents with  . URI    HPI HERMA UBALLE is a 67 y.o. female.   Meli presents with her husband with complaints of cough congestion and allergy symptoms. She states she has had this issue her entire life, worse over the past three years. Cough is often worse at night, her husband states it keeps him up at night. He is concerned about her CHF. She is chronic dialysis patient, MWF, goes regularly and doesn't miss. Last on Friday. No swelling of legs. Still makes urine, no changes. States has seen allergist in the past with multiple environmental allergies. Takes daily claritin. Has flonase at home but doesn't take regularly. History of asthma, uses inhaler occasionally. Denies shortness of breath  Or chest pain. Patient denies any acute worsening of symptoms. No longer follows with allergist due to the cough. History of gerd as well, uses omeprazole daily. Hx of OA, asthma, CHF, DM, ESRD, gerd, htn, neuropathy, allergies, PVD.     ROS per HPI.      Past Medical History:  Diagnosis Date  . Abdominal bruit   . Anemia   . Anxiety   . Arthritis    Osteoarthritis  . Asthma   . Cervical disc disease    "pinced nerve"  . CHF (congestive heart failure) ([place])   . Complication of anesthesia    " after I got home from my last procedure, I started itching."  . Diabetes mellitus    Type II  . Diverticulitis   . ESRD on peritoneal dialysis [facility])    Hemodialysis - MWF- [place]   . GERD (gastroesophageal reflux disease)    from medications  . GI bleed 03/31/2013  . History of hiatal hernia   . Hyperlipidemia   . Hypertension   . Neuropathy    left leg  . Osteoporosis   . Peripheral vascular disease ([place])   . Pneumonia    "very young"  . Seasonal allergies   . Shortness of breath dyspnea    WIth exertion  . Sleep apnea    can't afford  cpap    Patient Active Problem List   Diagnosis Date Noted  . Acute pulmonary edema ([place]) 02/21/2018  . 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[place]) 02/03/2018  . End-stage renal disease on hemodialysis ([place])   . Respiratory failure ([place]) 01/21/2018  . Achilles tendon contracture, right 11/09/2017  . Labile blood glucose   . Anemia of infection and chronic disease   . Essential hypertension   . Black stool   . Right sided weakness   . Subdural hemorrhage following injury ([place]) 08/30/2017  . Diabetic peripheral neuropathy ([place])   . Chronic obstructive pulmonary disease ([place])   . Seizure prophylaxis   . Subdural hematoma ([place]) 08/24/2017  . Traumatic subdural hematoma ([place]) 08/23/2017  . Fall   . SDH (subdural hematoma) ([place])   . Acute respiratory failure with hypoxia ([place])   . Diabetes mellitus type 2 in nonobese (HCC)   . Leukocytosis   . Labile blood pressure   . Generalized anxiety disorder   . Debility 08/16/2017  . Amputated great toe of right foot ([place])   . Amputated toe of left foot ([place])   . Post-operative pain   . ESRD on dialysis ([place])   . Diabetes mellitus ([place])   . Anxiety state   .  Benign essential HTN   . Acute blood loss anemia   . Anemia of chronic disease   . Idiopathic chronic venous hypertension of both lower extremities with inflammation 10/13/2016  . S/P transmetatarsal amputation of foot, left ([place]) 09/21/2016  . Onychomycosis 08/15/2016  . PAD (peripheral artery disease) ([place]) 07/08/2016  . Atherosclerosis of native artery of left lower extremity with gangrene ([place]) 06/23/2016  . GERD (gastroesophageal reflux disease) 10/07/2015  . ARF (acute renal failure) ([place])   . Hypothermia 06/12/2015  . Acute on chronic renal failure ([place]) 06/12/2015  . CHF (congestive heart failure) ([place]) 07/30/2014  . CKD (chronic kidney disease) stage 4, GFR 15-29 ml/min (HCC) 06/27/2014  . Hypertensive heart disease 05/25/2013  . CKD (chronic kidney  disease) stage 3, GFR 30-59 ml/min (HCC) 05/25/2013  . Pulmonary edema 05/23/2013  . Type 2 diabetes mellitus with diabetic nephropathy ([place]) 05/23/2013  . Type 2 diabetes mellitus with hyperosmolar nonketotic hyperglycemia ([place]) 04/13/2013  . Chronic diastolic CHF (congestive heart failure) ([place]) 04/13/2013  . UGI bleed 03/31/2013  . Hypokalemia 08/24/2012  . Type II or unspecified type diabetes mellitus without mention of complication, not stated as uncontrolled 12/27/2007  . HLD (hyperlipidemia) 12/27/2007  . ALLERGIC RHINITIS 12/27/2007  . Asthma 12/27/2007    Past Surgical History:  Procedure Laterality Date  . A/V SHUNTOGRAM N/A 09/22/2016   Procedure: A/V Shuntogram - left arm;  Surgeon: Serafina Mitchell, MD;  Location: [place] [facility];  Service: Cardiovascular;  Laterality: N/A;  . A/V SHUNTOGRAM N/A 03/22/2018   Procedure: A/V SHUNTOGRAM - left arm;  Surgeon: Serafina Mitchell, MD;  Location: [place] [facility];  Service: Cardiovascular;  Laterality: N/A;  . ABDOMINAL HYSTERECTOMY  1993`  . AMPUTATION Left 09/01/2016   Procedure: LEFT FOOT TRANSMETATARSAL AMPUTATION;  Surgeon: Newt Minion, MD;  Location: [place];  Service: Orthopedics;  Laterality: Left;  . AMPUTATION Right 08/11/2017   Procedure: RIGHT GREAT TOE AMPUTATION DIGIT;  Surgeon: Rosetta Posner, MD;  Location: [place];  Service: Vascular;  Laterality: Right;  . AMPUTATION Right 12/31/2017   Procedure: RIGHT TRANSMETATARSAL AMPUTATION;  Surgeon: Newt Minion, MD;  Location: [place];  Service: Orthopedics;  Laterality: Right;  . AV FISTULA PLACEMENT Left 04/21/2016   Procedure: INSERTION OF ARTERIOVENOUS (AV) GORE-TEX GRAFT ARM LEFT;  Surgeon: Elam Dutch, MD;  Location: [place];  Service: Vascular;  Laterality: Left;  . [place] TRANSPOSITION Left 07/10/2014   Procedure: BASCILIC VEIN TRANSPOSITION;  Surgeon: Angelia Mould, MD;  Location: [place];  Service: Vascular;  Laterality: Left;  . BASCILIC VEIN  TRANSPOSITION Right 11/08/2014   Procedure: FIRST STAGE BASILIC VEIN TRANSPOSITION;  Surgeon: Angelia Mould, MD;  Location: [place];  Service: Vascular;  Laterality: Right;  . BASCILIC VEIN TRANSPOSITION Right 01/18/2015   Procedure: SECOND STAGE BASILIC VEIN TRANSPOSITION;  Surgeon: Angelia Mould, MD;  Location: [place];  Service: Vascular;  Laterality: Right;  . CRANIOTOMY N/A 08/23/2017   Procedure: CRANIOTOMY HEMATOMA EVACUATION SUBDURAL;  Surgeon: Ashok Pall, MD;  Location: [place];  Service: Neurosurgery;  Laterality: N/A;  . CRANIOTOMY Left 08/24/2017   Procedure: CRANIOTOMY FOR RECURRENT ACUTE SUBDURAL HEMATOMA;  Surgeon: Ashok Pall, MD;  Location: [place];  Service: Neurosurgery;  Laterality: Left;  . ESOPHAGOGASTRODUODENOSCOPY N/A 03/31/2013   Procedure: ESOPHAGOGASTRODUODENOSCOPY (EGD);  Surgeon: Gatha Mayer, MD;  Location[facility] ENDOSCOPY;  Service: Endoscopy;  Laterality: N/A;  . EYE SURGERY     laser surgery  . FEMORAL-POPLITEAL  BYPASS GRAFT Left 07/08/2016   Procedure: LEFT  FEMORAL-BELOW KNEE POPLITEAL ARTERY BYPASS GRAFT USING 6MM X 80 CM PROPATEN GORETEX GRAFT WITH RINGS.;  Surgeon: Rosetta Posner, MD;  Location: [place];  Service: Vascular;  Laterality: Left;  . FEMORAL-POPLITEAL BYPASS GRAFT Right 08/11/2017   Procedure: RIGHT FEMORAL TO BELOW KNEE POPLITEAL ARTERKY  BYPASS GRAFT USING 6MM RINGED PROPATEN GRAFT;  Surgeon: Rosetta Posner, MD;  Location: [place];  Service: Vascular;  Laterality: Right;  . FISTULOGRAM Left 10/29/2014   Procedure: FISTULOGRAM;  Surgeon: Angelia Mould, MD;  Location: [facility] C[facility] LAB;  Service: Cardiovascular;  Laterality: Left;  . LIGATION OF ARTERIOVENOUS  FISTULA Left 04/21/2016   Procedure: LIGATION OF ARTERIOVENOUS  FISTULA LEFT ARM;  Surgeon: Elam Dutch, MD;  Location: [place];  Service: Vascular;  Laterality: Left;  . LOWER EXTREMITY ANGIOGRAPHY N/A 04/06/2017   Procedure: Lower Extremity Angiography - Right;  Surgeon: Serafina Mitchell,  MD;  Location: [place] [facility];  Service: Cardiovascular;  Laterality: N/A;  . PATCH ANGIOPLASTY Right 01/18/2015   Procedure: BASILIC VEIN PATCH ANGIOPLASTY USING VASCUGUARD PATCH;  Surgeon: Angelia Mould, MD;  Location: [place];  Service: Vascular;  Laterality: Right;  . PERIPHERAL VASCULAR BALLOON ANGIOPLASTY  09/22/2016   Procedure: Peripheral Vascular Balloon Angioplasty;  Surgeon: Serafina Mitchell, MD;  Location: [place] [facility];  Service: Cardiovascular;;  Lt. Fistula  . PERIPHERAL VASCULAR BALLOON ANGIOPLASTY Left 03/22/2018   Procedure: PERIPHERAL VASCULAR BALLOON ANGIOPLASTY;  Surgeon: Serafina Mitchell, MD;  Location: [place] [facility];  Service: Cardiovascular;  Laterality: Left;  Arm shunt  . PERIPHERAL VASCULAR CATHETERIZATION N/A 06/23/2016   Procedure: Abdominal Aortogram w/Lower Extremity;  Surgeon: Serafina Mitchell, MD;  Location: [place] [facility];  Service: Cardiovascular;  Laterality: N/A;  . PERIPHERAL VASCULAR CATHETERIZATION  06/23/2016   Procedure: Peripheral Vascular Intervention;  Surgeon: Serafina Mitchell, MD;  Location: [place] [facility];  Service: Cardiovascular;;  lt common and external illiac artery    OB History   None      Home Medications    Prior to Admission medications   Medication Sig Start Date End Date Taking? Authorizing Provider  albuterol (PROVENTIL HFA;VENTOLIN HFA) 108 (90 Base) MCG/ACT inhaler Inhale 1-2 puffs into the lungs every 6 (six) hours as needed for wheezing or shortness of breath. 01/23/18   Allie Bossier, MD  Amino Acids-Protein Hydrolys (FEEDING SUPPLEMENT, PRO-STAT SUGAR FREE 64,) LIQD Take 30 mLs by mouth 2 (two) times daily. 09/16/17   Love, Ivan Anchors, PA-C  aspirin EC 81 MG tablet Take 81 mg by mouth daily.    [provider]  atorvastatin (LIPITOR) 20 MG tablet Take 20 mg by mouth at bedtime.     [provider]  budesonide-formoterol (SYMBICORT) 160-4.5 MCG/ACT inhaler Inhale 1 puff into the lungs  daily as needed (for respiratory issues).     [provider]  carvedilol (COREG) 12.5 MG tablet Take 12.5 mg by mouth 2 (two) times daily with a meal.    [provider]  cinacalcet (SENSIPAR) 60 MG tablet Take 60 mg by mouth daily. Pt. Gets at dialysis 01/06/18   [provider]  cloNIDine (CATAPRES) 0.2 MG tablet Take 1 tablet (0.2 mg total) by mouth 3 (three) times daily. 09/16/17   Love, Ivan Anchors, PA-C  clopidogrel (PLAVIX) 75 MG tablet Take 75 mg by mouth daily.    [provider]  Dakins (SODIUM HYPOCHLORITE) external solution Apply  topically daily. Cleanse groin and cover with damp gauze soaked in dakins. 09/16/17   Love, Ivan Anchors, PA-C  diazepam (VALIUM) 5 MG tablet Take 5 mg by mouth 2 (two) times daily as needed for anxiety.    [provider]  doxycycline (VIBRA-TABS) 100 MG tablet Take 1 tablet (100 mg total) by mouth 2 (two) times daily. 12/23/17   Newt Minion, MD  fluticasone (FLONASE) 50 MCG/ACT nasal spray Place 1 spray into both nostrils daily as needed for allergies or rhinitis.    [provider]  gabapentin (NEURONTIN) 100 MG capsule TAKE 1 CAPSULE (100 MG TOTAL) BY MOUTH AT BEDTIME. WHEN NECESSARY FOR NEUROPATHY PAIN 01/11/18   Newt Minion, MD  hydrALAZINE (APRESOLINE) 25 MG tablet Take 25 mg by mouth daily. 01/17/18   [provider]  hydrocerin (EUCERIN) CREA Apply 381 application topically 2 (two) times daily. Patient taking differently: Apply 1 application topically 2 (two) times daily.  09/16/17   Love, Ivan Anchors, PA-C  insulin NPH Human (HUMULIN N,NOVOLIN N) 100 UNIT/ML injection Inject 0.1 mLs (10 Units total) into the skin at bedtime. 01/23/18   Allie Bossier, MD  insulin regular (NOVOLIN R,HUMULIN R) 100 units/mL injection Inject 3-8 Units into the skin 3 (three) times daily before meals. Per sliding scale on non-dialysis days    [provider]  isosorbide-hydrALAZINE (BIDIL) 20-37.5 MG tablet Take 1  tablet by mouth 2 (two) times daily.    [provider]  LEVEMIR FLEXTOUCH 100 UNIT/ML Pen Inject 7 Units into the skin at bedtime. 09/16/17   Love, Ivan Anchors, PA-C  lidocaine, PF, (XYLOCAINE) 1 % SOLN injection Inject 5 mLs into the skin as needed (topical anesthesia for hemodialysis ifGEBAUERS is ineffective.). 01/23/18   Allie Bossier, MD  lidocaine-prilocaine (EMLA) cream Apply 1 application topically 3 (three) times a week. 1-2 hours prior to dialysis 01/07/18   [provider]  loratadine (CLARITIN) 10 MG tablet Take 10 mg by mouth daily as needed for allergies.    [provider]  multivitamin (RENA-VIT) TABS tablet Take 1 tablet by mouth daily.  01/24/15   [provider]  omeprazole (PRILOSEC) 20 MG capsule Take 20 mg by mouth daily.     [provider]  oxyCODONE-acetaminophen (PERCOCET/ROXICET) 5-325 MG tablet Take 1 tablet by mouth every 4 (four) hours as needed for severe pain. 12/31/17   Newt Minion, MD  polyethylene glycol [facility] / Floria Raveling) packet Take 17 g by mouth daily. Patient taking differently: Take 17 g by mouth daily as needed for moderate constipation.  09/17/17   Bary Leriche, PA-C    Family History Family History  Problem Relation Age of Onset  . Other Mother        not sure of cause of death  . Diabetes Father   . Pancreatic cancer Maternal Grandmother   . Colon cancer Neg Hx     Social History Social History   Tobacco Use  . Smoking status: Former Smoker    Packs/day: 0.35    Years: 40.00    Pack years: 14.00    Types: Cigarettes    Last attempt to quit: 05/10/2012    Years since quitting: 5.9  . Smokeless tobacco: Never Used  Substance Use Topics  . Alcohol use: No  . Drug use: No    Comment: marijuana; quit in early 1980's     Allergies   Prednisone and Lisinopril   Review of Systems Review of Systems  Physical Exam Triage Vital Signs ED Triage Vitals [04/03/18 1535]  Enc Vitals Group      BP (!) 156/70     Pulse Rate 63     Resp 19     Temp 98.4 F (36.9 C)     Temp Source Temporal     SpO2 93 %     Weight      Height      Head Circumference      Peak Flow      Pain Score      Pain Loc      Pain Edu?      Excl. in [place]?    No data found.  Updated Vital Signs BP (!) 156/70 (BP Location: Right Arm)   Pulse 63   Temp 98.4 F (36.9 C) (Temporal)   Resp 19   SpO2 93%    Physical Exam  Constitutional: She is oriented to person, place, and time. She appears well-developed and well-nourished. No distress.  HENT:  Head: Normocephalic and atraumatic.  Right Ear: Tympanic membrane, external ear and ear canal normal.  Left Ear: Tympanic membrane, external ear and ear canal normal.  Nose: Rhinorrhea present. Right sinus exhibits no maxillary sinus tenderness and no frontal sinus tenderness. Left sinus exhibits no maxillary sinus tenderness and no frontal sinus tenderness.  Mouth/Throat: Uvula is midline, oropharynx is clear and moist and mucous membranes are normal. No tonsillar exudate.  Eyes: Pupils are equal, round, and reactive to light. Conjunctivae and EOM are normal.  Cardiovascular: Normal rate, regular rhythm and normal heart sounds.  Pulmonary/Chest: Effort normal and breath sounds normal.  Lungs clear. No cough throughout exam; no increased work of breathing   Musculoskeletal:  No peripheral edema to lower extremities  Neurological: She is alert and oriented to person, place, and time.  Skin: Skin is warm and dry.     UC Treatments / Results  Labs (all labs ordered are listed, but only abnormal results are displayed) Labs Reviewed - No data to display  EKG None  Radiology No results found.  Procedures Procedures (including critical care time)  Medications Ordered in UC Medications - No data to display  Initial Impression / Assessment and Plan / UC Course  I have reviewed the triage vital signs and the nursing notes.  Pertinent labs &  imaging results that were available during my care of the patient were reviewed by me and considered in my medical decision making (see chart for details).     Benign physical exam. Vitals stable. Dialysis patient. No swelling, no increased work of breathing, or shortness of breath . Husband states he has a hard time sleeping due to cough, he would like her to take a cough syrup. Discussed that is not likely best option for long term management, discussed potential sources of cough to include allergies, post nasal drip, asthma, gerd.  Recommend regular use of nasal spray, inhaler and to add zantac at night for gerd treatment. If symptoms worsen or do not improve in the next week to return to be seen or to follow up with PCP.  Patient verbalized understanding and agreeable to plan.    Final Clinical Impressions(s) / UC Diagnoses   Final diagnoses:  Cough  Environmental allergies  Gastroesophageal reflux disease, esophagitis presence not specified     Discharge Instructions     Please use your Flonase daily.  Please use your symbicort daily.  May try adding in a zantac at night to further help  with your reflux symptoms.  Continue with previously prescribed medications.  If persistent or no improvement please follow up with your primary care provider or your allergist for further management.    ED Prescriptions    None     Controlled Substance Prescriptions [place] Controlled Substance Registry consulted? Not Applicable   Zigmund Gottron, NP 04/03/18 1642

## 2018-04-03 NOTE — ED Triage Notes (Signed)
Pt presents with upper respiratory symptoms; persistent cough, watery eyes, runny nose and minor congestion.

## 2018-04-04 DIAGNOSIS — E1122 Type 2 diabetes mellitus with diabetic chronic kidney disease: Secondary | ICD-10-CM | POA: Diagnosis not present

## 2018-04-04 DIAGNOSIS — E876 Hypokalemia: Secondary | ICD-10-CM | POA: Diagnosis not present

## 2018-04-04 DIAGNOSIS — Z23 Encounter for immunization: Secondary | ICD-10-CM | POA: Diagnosis not present

## 2018-04-04 DIAGNOSIS — N2581 Secondary hyperparathyroidism of renal origin: Secondary | ICD-10-CM | POA: Diagnosis not present

## 2018-04-04 DIAGNOSIS — N186 End stage renal disease: Secondary | ICD-10-CM | POA: Diagnosis not present

## 2018-04-05 DIAGNOSIS — R0989 Other specified symptoms and signs involving the circulatory and respiratory systems: Secondary | ICD-10-CM | POA: Diagnosis not present

## 2018-04-06 DIAGNOSIS — Z23 Encounter for immunization: Secondary | ICD-10-CM | POA: Diagnosis not present

## 2018-04-06 DIAGNOSIS — N2581 Secondary hyperparathyroidism of renal origin: Secondary | ICD-10-CM | POA: Diagnosis not present

## 2018-04-06 DIAGNOSIS — E1122 Type 2 diabetes mellitus with diabetic chronic kidney disease: Secondary | ICD-10-CM | POA: Diagnosis not present

## 2018-04-06 DIAGNOSIS — E876 Hypokalemia: Secondary | ICD-10-CM | POA: Diagnosis not present

## 2018-04-06 DIAGNOSIS — N186 End stage renal disease: Secondary | ICD-10-CM | POA: Diagnosis not present

## 2018-04-07 DIAGNOSIS — E113532 Type 2 diabetes mellitus with proliferative diabetic retinopathy with traction retinal detachment not involving the macula, left eye: Secondary | ICD-10-CM | POA: Diagnosis not present

## 2018-04-07 DIAGNOSIS — H35371 Puckering of macula, right eye: Secondary | ICD-10-CM | POA: Diagnosis not present

## 2018-04-07 DIAGNOSIS — E113531 Type 2 diabetes mellitus with proliferative diabetic retinopathy with traction retinal detachment not involving the macula, right eye: Secondary | ICD-10-CM | POA: Diagnosis not present

## 2018-04-07 DIAGNOSIS — H3582 Retinal ischemia: Secondary | ICD-10-CM | POA: Diagnosis not present

## 2018-04-07 DIAGNOSIS — H3562 Retinal hemorrhage, left eye: Secondary | ICD-10-CM | POA: Diagnosis not present

## 2018-04-07 DIAGNOSIS — H3561 Retinal hemorrhage, right eye: Secondary | ICD-10-CM | POA: Diagnosis not present

## 2018-04-07 DIAGNOSIS — H2513 Age-related nuclear cataract, bilateral: Secondary | ICD-10-CM | POA: Diagnosis not present

## 2018-04-07 DIAGNOSIS — E113512 Type 2 diabetes mellitus with proliferative diabetic retinopathy with macular edema, left eye: Secondary | ICD-10-CM | POA: Diagnosis not present

## 2018-04-07 DIAGNOSIS — H35372 Puckering of macula, left eye: Secondary | ICD-10-CM | POA: Diagnosis not present

## 2018-04-07 DIAGNOSIS — E113511 Type 2 diabetes mellitus with proliferative diabetic retinopathy with macular edema, right eye: Secondary | ICD-10-CM | POA: Diagnosis not present

## 2018-04-08 DIAGNOSIS — Z23 Encounter for immunization: Secondary | ICD-10-CM | POA: Diagnosis not present

## 2018-04-08 DIAGNOSIS — E876 Hypokalemia: Secondary | ICD-10-CM | POA: Diagnosis not present

## 2018-04-08 DIAGNOSIS — E1122 Type 2 diabetes mellitus with diabetic chronic kidney disease: Secondary | ICD-10-CM | POA: Diagnosis not present

## 2018-04-08 DIAGNOSIS — N186 End stage renal disease: Secondary | ICD-10-CM | POA: Diagnosis not present

## 2018-04-08 DIAGNOSIS — N2581 Secondary hyperparathyroidism of renal origin: Secondary | ICD-10-CM | POA: Diagnosis not present

## 2018-04-11 DIAGNOSIS — E1122 Type 2 diabetes mellitus with diabetic chronic kidney disease: Secondary | ICD-10-CM | POA: Diagnosis not present

## 2018-04-11 DIAGNOSIS — E876 Hypokalemia: Secondary | ICD-10-CM | POA: Diagnosis not present

## 2018-04-11 DIAGNOSIS — N2581 Secondary hyperparathyroidism of renal origin: Secondary | ICD-10-CM | POA: Diagnosis not present

## 2018-04-11 DIAGNOSIS — N186 End stage renal disease: Secondary | ICD-10-CM | POA: Diagnosis not present

## 2018-04-11 DIAGNOSIS — Z23 Encounter for immunization: Secondary | ICD-10-CM | POA: Diagnosis not present

## 2018-04-13 DIAGNOSIS — N2581 Secondary hyperparathyroidism of renal origin: Secondary | ICD-10-CM | POA: Diagnosis not present

## 2018-04-13 DIAGNOSIS — E1122 Type 2 diabetes mellitus with diabetic chronic kidney disease: Secondary | ICD-10-CM | POA: Diagnosis not present

## 2018-04-13 DIAGNOSIS — N186 End stage renal disease: Secondary | ICD-10-CM | POA: Diagnosis not present

## 2018-04-13 DIAGNOSIS — E876 Hypokalemia: Secondary | ICD-10-CM | POA: Diagnosis not present

## 2018-04-13 DIAGNOSIS — Z23 Encounter for immunization: Secondary | ICD-10-CM | POA: Diagnosis not present

## 2018-04-15 DIAGNOSIS — E1122 Type 2 diabetes mellitus with diabetic chronic kidney disease: Secondary | ICD-10-CM | POA: Diagnosis not present

## 2018-04-15 DIAGNOSIS — N186 End stage renal disease: Secondary | ICD-10-CM | POA: Diagnosis not present

## 2018-04-15 DIAGNOSIS — N2581 Secondary hyperparathyroidism of renal origin: Secondary | ICD-10-CM | POA: Diagnosis not present

## 2018-04-15 DIAGNOSIS — Z23 Encounter for immunization: Secondary | ICD-10-CM | POA: Diagnosis not present

## 2018-04-15 DIAGNOSIS — E876 Hypokalemia: Secondary | ICD-10-CM | POA: Diagnosis not present

## 2018-04-18 DIAGNOSIS — E1122 Type 2 diabetes mellitus with diabetic chronic kidney disease: Secondary | ICD-10-CM | POA: Diagnosis not present

## 2018-04-18 DIAGNOSIS — N186 End stage renal disease: Secondary | ICD-10-CM | POA: Diagnosis not present

## 2018-04-18 DIAGNOSIS — E876 Hypokalemia: Secondary | ICD-10-CM | POA: Diagnosis not present

## 2018-04-18 DIAGNOSIS — Z23 Encounter for immunization: Secondary | ICD-10-CM | POA: Diagnosis not present

## 2018-04-18 DIAGNOSIS — N2581 Secondary hyperparathyroidism of renal origin: Secondary | ICD-10-CM | POA: Diagnosis not present

## 2018-04-20 DIAGNOSIS — E1122 Type 2 diabetes mellitus with diabetic chronic kidney disease: Secondary | ICD-10-CM | POA: Diagnosis not present

## 2018-04-20 DIAGNOSIS — E876 Hypokalemia: Secondary | ICD-10-CM | POA: Diagnosis not present

## 2018-04-20 DIAGNOSIS — Z23 Encounter for immunization: Secondary | ICD-10-CM | POA: Diagnosis not present

## 2018-04-20 DIAGNOSIS — N2581 Secondary hyperparathyroidism of renal origin: Secondary | ICD-10-CM | POA: Diagnosis not present

## 2018-04-20 DIAGNOSIS — N186 End stage renal disease: Secondary | ICD-10-CM | POA: Diagnosis not present

## 2018-04-21 DIAGNOSIS — E113511 Type 2 diabetes mellitus with proliferative diabetic retinopathy with macular edema, right eye: Secondary | ICD-10-CM | POA: Diagnosis not present

## 2018-04-22 DIAGNOSIS — E1122 Type 2 diabetes mellitus with diabetic chronic kidney disease: Secondary | ICD-10-CM | POA: Diagnosis not present

## 2018-04-22 DIAGNOSIS — N186 End stage renal disease: Secondary | ICD-10-CM | POA: Diagnosis not present

## 2018-04-22 DIAGNOSIS — E876 Hypokalemia: Secondary | ICD-10-CM | POA: Diagnosis not present

## 2018-04-22 DIAGNOSIS — N2581 Secondary hyperparathyroidism of renal origin: Secondary | ICD-10-CM | POA: Diagnosis not present

## 2018-04-22 DIAGNOSIS — Z23 Encounter for immunization: Secondary | ICD-10-CM | POA: Diagnosis not present

## 2018-04-25 DIAGNOSIS — N186 End stage renal disease: Secondary | ICD-10-CM | POA: Diagnosis not present

## 2018-04-25 DIAGNOSIS — N2581 Secondary hyperparathyroidism of renal origin: Secondary | ICD-10-CM | POA: Diagnosis not present

## 2018-04-25 DIAGNOSIS — E1122 Type 2 diabetes mellitus with diabetic chronic kidney disease: Secondary | ICD-10-CM | POA: Diagnosis not present

## 2018-04-25 DIAGNOSIS — Z23 Encounter for immunization: Secondary | ICD-10-CM | POA: Diagnosis not present

## 2018-04-25 DIAGNOSIS — E876 Hypokalemia: Secondary | ICD-10-CM | POA: Diagnosis not present

## 2018-04-26 DIAGNOSIS — Z992 Dependence on renal dialysis: Secondary | ICD-10-CM | POA: Diagnosis not present

## 2018-04-26 DIAGNOSIS — N186 End stage renal disease: Secondary | ICD-10-CM | POA: Diagnosis not present

## 2018-04-26 DIAGNOSIS — I159 Secondary hypertension, unspecified: Secondary | ICD-10-CM | POA: Diagnosis not present

## 2018-04-27 DIAGNOSIS — M0000 Staphylococcal arthritis, unspecified joint: Secondary | ICD-10-CM | POA: Diagnosis not present

## 2018-04-27 DIAGNOSIS — E876 Hypokalemia: Secondary | ICD-10-CM | POA: Diagnosis not present

## 2018-04-27 DIAGNOSIS — N2581 Secondary hyperparathyroidism of renal origin: Secondary | ICD-10-CM | POA: Diagnosis not present

## 2018-04-27 DIAGNOSIS — Z23 Encounter for immunization: Secondary | ICD-10-CM | POA: Diagnosis not present

## 2018-04-27 DIAGNOSIS — N186 End stage renal disease: Secondary | ICD-10-CM | POA: Diagnosis not present

## 2018-04-27 DIAGNOSIS — E1122 Type 2 diabetes mellitus with diabetic chronic kidney disease: Secondary | ICD-10-CM | POA: Diagnosis not present

## 2018-04-27 DIAGNOSIS — D631 Anemia in chronic kidney disease: Secondary | ICD-10-CM | POA: Diagnosis not present

## 2018-04-29 DIAGNOSIS — E876 Hypokalemia: Secondary | ICD-10-CM | POA: Diagnosis not present

## 2018-04-29 DIAGNOSIS — N2581 Secondary hyperparathyroidism of renal origin: Secondary | ICD-10-CM | POA: Diagnosis not present

## 2018-04-29 DIAGNOSIS — M0000 Staphylococcal arthritis, unspecified joint: Secondary | ICD-10-CM | POA: Diagnosis not present

## 2018-04-29 DIAGNOSIS — D631 Anemia in chronic kidney disease: Secondary | ICD-10-CM | POA: Diagnosis not present

## 2018-04-29 DIAGNOSIS — N186 End stage renal disease: Secondary | ICD-10-CM | POA: Diagnosis not present

## 2018-04-29 DIAGNOSIS — E1122 Type 2 diabetes mellitus with diabetic chronic kidney disease: Secondary | ICD-10-CM | POA: Diagnosis not present

## 2018-05-02 ENCOUNTER — Encounter (HOSPITAL_COMMUNITY): Payer: Self-pay | Admitting: Emergency Medicine

## 2018-05-02 ENCOUNTER — Ambulatory Visit (HOSPITAL_COMMUNITY)
Admission: EM | Admit: 2018-05-02 | Discharge: 2018-05-02 | Disposition: A | Discharge status: Home / Self Care | Payer: Medicare Other | Attending: Internal Medicine | Admitting: Internal Medicine

## 2018-05-02 ENCOUNTER — Other Ambulatory Visit: Payer: Self-pay

## 2018-05-02 DIAGNOSIS — N186 End stage renal disease: Secondary | ICD-10-CM | POA: Diagnosis not present

## 2018-05-02 DIAGNOSIS — M1711 Unilateral primary osteoarthritis, right knee: Secondary | ICD-10-CM | POA: Diagnosis not present

## 2018-05-02 DIAGNOSIS — N2581 Secondary hyperparathyroidism of renal origin: Secondary | ICD-10-CM | POA: Diagnosis not present

## 2018-05-02 DIAGNOSIS — E876 Hypokalemia: Secondary | ICD-10-CM | POA: Diagnosis not present

## 2018-05-02 DIAGNOSIS — M25561 Pain in right knee: Secondary | ICD-10-CM | POA: Diagnosis not present

## 2018-05-02 DIAGNOSIS — D631 Anemia in chronic kidney disease: Secondary | ICD-10-CM | POA: Diagnosis not present

## 2018-05-02 DIAGNOSIS — M0000 Staphylococcal arthritis, unspecified joint: Secondary | ICD-10-CM | POA: Diagnosis not present

## 2018-05-02 DIAGNOSIS — E1122 Type 2 diabetes mellitus with diabetic chronic kidney disease: Secondary | ICD-10-CM | POA: Diagnosis not present

## 2018-05-02 MED ORDER — TRIAMCINOLONE ACETONIDE 40 MG/ML IJ SUSP
10.0000 mg | Freq: Once | INTRAMUSCULAR | Status: AC
  Administered 2018-05-02: 10 mg via INTRA_ARTICULAR

## 2018-05-02 MED ORDER — TRIAMCINOLONE ACETONIDE 40 MG/ML IJ SUSP
INTRAMUSCULAR | Status: AC
  Filled 2018-05-02: qty 1

## 2018-05-02 NOTE — Discharge Instructions (Addendum)
1-Watch out for redness, hotness or worsening of knee pain. The back of your R knee has some swelling which feels like may be a Backer's cyst. The orthopedist knows how to care for this.  2- You may take Tylenol 500 mg 1-2 every 8 hours as needed for pain, until the medication kicks in.

## 2018-05-02 NOTE — ED Triage Notes (Signed)
Chronic right knee pain.  No new injury occured

## 2018-05-02 NOTE — ED Provider Notes (Signed)
[facility]    CSN: [id number] Arrival date & time: 05/02/18  1731      History   Chief Complaint Chief Complaint  Patient presents with  . Knee Pain    HPI Destiny Day is a 67 y.o. female.   Onset of R knee pain fairing since 2 days ago, and noticed increased swelling since yesterday and feels pain on the back and front of her knee. She has apt with ortho in November. Pain is worse to move R knee and try to walk. Has not tried anything for pain. She states she always tomes here. Has responded to steroid injection in the past, which was 1 year ago. She is currently doing dialysis.      Past Medical History:  Diagnosis Date  . Abdominal bruit   . Anemia   . Anxiety   . Arthritis    Osteoarthritis  . Asthma   . Cervical disc disease    "pinced nerve"  . CHF (congestive heart failure) ([place])   . Complication of anesthesia    " after I got home from my last procedure, I started itching."  . Diabetes mellitus    Type II  . Diverticulitis   . ESRD on peritoneal dialysis [facility])    Hemodialysis - MWF- [place]   . GERD (gastroesophageal reflux disease)    from medications  . GI bleed 03/31/2013  . History of hiatal hernia   . Hyperlipidemia   . Hypertension   . Neuropathy    left leg  . Osteoporosis   . Peripheral vascular disease ([place])   . Pneumonia    "very young"  . Seasonal allergies   . Shortness of breath dyspnea    WIth exertion  . Sleep apnea    can't afford cpap    Patient Active Problem List   Diagnosis Date Noted  . Acute pulmonary edema ([place]) 02/21/2018  . 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[place]) 02/03/2018  . End-stage renal disease on hemodialysis ([place])   . Respiratory failure ([place]) 01/21/2018  . Achilles tendon contracture, right 11/09/2017  . Labile blood glucose   . Anemia of infection and chronic disease   . Essential hypertension   . Black stool   . Right sided weakness   . Subdural hemorrhage following  injury ([place]) 08/30/2017  . Diabetic peripheral neuropathy ([place])   . Chronic obstructive pulmonary disease ([place])   . Seizure prophylaxis   . Subdural hematoma ([place]) 08/24/2017  . Traumatic subdural hematoma ([place]) 08/23/2017  . Fall   . SDH (subdural hematoma) ([place])   . Acute respiratory failure with hypoxia ([place])   . Diabetes mellitus type 2 in nonobese (HCC)   . Leukocytosis   . Labile blood pressure   . Generalized anxiety disorder   . Debility 08/16/2017  . Amputated great toe of right foot ([place])   . Amputated toe of left foot ([place])   . Post-operative pain   . ESRD on dialysis ([place])   . Diabetes mellitus ([place])   . Anxiety state   . Benign essential HTN   . Acute blood loss anemia   . Anemia of chronic disease   . Idiopathic chronic venous hypertension of both lower extremities with inflammation 10/13/2016  . S/P transmetatarsal amputation of foot, left ([place]) 09/21/2016  . Onychomycosis 08/15/2016  . PAD (peripheral artery disease) ([place]) 07/08/2016  . Atherosclerosis of native artery of left lower extremity with gangrene ([place]) 06/23/2016  . GERD (gastroesophageal reflux  disease) 10/07/2015  . ARF (acute renal failure) ([place])   . Hypothermia 06/12/2015  . Acute on chronic renal failure ([place]) 06/12/2015  . CHF (congestive heart failure) ([place]) 07/30/2014  . CKD (chronic kidney disease) stage 4, GFR 15-29 ml/min (HCC) 06/27/2014  . Hypertensive heart disease 05/25/2013  . CKD (chronic kidney disease) stage 3, GFR 30-59 ml/min (HCC) 05/25/2013  . Pulmonary edema 05/23/2013  . Type 2 diabetes mellitus with diabetic nephropathy ([employer]) 05/23/2013  . Type 2 diabetes mellitus with hyperosmolar nonketotic hyperglycemia ([place]) 04/13/2013  . Chronic diastolic CHF (congestive heart failure) ([place]) 04/13/2013  . UGI bleed 03/31/2013  . Hypokalemia 08/24/2012  . Type II or unspecified type diabetes mellitus without mention of complication, not stated as uncontrolled 12/27/2007  . HLD  (hyperlipidemia) 12/27/2007  . ALLERGIC RHINITIS 12/27/2007  . Asthma 12/27/2007    Past Surgical History:  Procedure Laterality Date  . A/V SHUNTOGRAM N/A 09/22/2016   Procedure: A/V Shuntogram - left arm;  Surgeon: Serafina Mitchell, MD;  Location: [facility];  Service: Cardiovascular;  Laterality: N/A;  . A/V SHUNTOGRAM N/A 03/22/2018   Procedure: A/V SHUNTOGRAM - left arm;  Surgeon: Serafina Mitchell, MD;  Location: [facility];  Service: Cardiovascular;  Laterality: N/A;  . ABDOMINAL HYSTERECTOMY  1993`  . AMPUTATION Left 09/01/2016   Procedure: LEFT FOOT TRANSMETATARSAL AMPUTATION;  Surgeon: Newt Minion, MD;  Location: [facility];  Service: Orthopedics;  Laterality: Left;  . AMPUTATION Right 08/11/2017   Procedure: RIGHT GREAT TOE AMPUTATION DIGIT;  Surgeon: Rosetta Posner, MD;  Location: [facility];  Service: Vascular;  Laterality: Right;  . AMPUTATION Right 12/31/2017   Procedure: RIGHT TRANSMETATARSAL AMPUTATION;  Surgeon: Newt Minion, MD;  Location: [facility];  Service: Orthopedics;  Laterality: Right;  . AV FISTULA PLACEMENT Left 04/21/2016   Procedure: INSERTION OF ARTERIOVENOUS (AV) GORE-TEX GRAFT ARM LEFT;  Surgeon: Elam Dutch, MD;  Location: [facility];  Service: Vascular;  Laterality: Left;  . [place] TRANSPOSITION Left 07/10/2014   Procedure: BASCILIC VEIN TRANSPOSITION;  Surgeon: Angelia Mould, MD;  Location: [facility];  Service: Vascular;  Laterality: Left;  . BASCILIC VEIN TRANSPOSITION Right 11/08/2014   Procedure: FIRST STAGE BASILIC VEIN TRANSPOSITION;  Surgeon: Angelia Mould, MD;  Location: [facility];  Service: Vascular;  Laterality: Right;  . BASCILIC VEIN TRANSPOSITION Right 01/18/2015   Procedure: SECOND STAGE BASILIC VEIN TRANSPOSITION;  Surgeon: Angelia Mould, MD;  Location: [facility];  Service: Vascular;  Laterality: Right;  . CRANIOTOMY N/A 08/23/2017   Procedure: CRANIOTOMY HEMATOMA EVACUATION SUBDURAL;  Surgeon: Ashok Pall, MD;  Location: [facility];  Service: Neurosurgery;  Laterality: N/A;  . CRANIOTOMY Left 08/24/2017   Procedure: CRANIOTOMY FOR RECURRENT ACUTE SUBDURAL HEMATOMA;  Surgeon: Ashok Pall, MD;  Location: [facility];  Service: Neurosurgery;  Laterality: Left;  . ESOPHAGOGASTRODUODENOSCOPY N/A 03/31/2013   Procedure: ESOPHAGOGASTRODUODENOSCOPY (EGD);  Surgeon: Gatha Mayer, MD;  Location[facility] ENDOSCOPY;  Service: Endoscopy;  Laterality: N/A;  . EYE SURGERY     laser surgery  . FEMORAL-POPLITEAL BYPASS GRAFT Left 07/08/2016   Procedure: LEFT  FEMORAL-BELOW KNEE POPLITEAL ARTERY BYPASS GRAFT USING 6MM X 80 CM PROPATEN GORETEX GRAFT WITH RINGS.;  Surgeon: Rosetta Posner, MD;  Location: [facility];  Service: Vascular;  Laterality: Left;  . FEMORAL-POPLITEAL BYPASS GRAFT Right 08/11/2017   Procedure: RIGHT FEMORAL TO BELOW KNEE POPLITEAL ARTERKY  BYPASS GRAFT USING 6MM RINGED PROPATEN GRAFT;  Surgeon: Rosetta Posner, MD;  Location: [facility];  Service: Vascular;  Laterality: Right;  . FISTULOGRAM Left 10/29/2014   Procedure: FISTULOGRAM;  Surgeon: Angelia Mould, MD;  Location: [facility] CATH LAB;  Service: Cardiovascular;  Laterality: Left;  . LIGATION OF ARTERIOVENOUS  FISTULA Left 04/21/2016   Procedure: LIGATION OF ARTERIOVENOUS  FISTULA LEFT ARM;  Surgeon: Elam Dutch, MD;  Location: [facility];  Service: Vascular;  Laterality: Left;  . LOWER EXTREMITY ANGIOGRAPHY N/A 04/06/2017   Procedure: Lower Extremity Angiography - Right;  Surgeon: Serafina Mitchell, MD;  Location: [facility];  Service: Cardiovascular;  Laterality: N/A;  . PATCH ANGIOPLASTY Right 01/18/2015   Procedure: BASILIC VEIN PATCH ANGIOPLASTY USING VASCUGUARD PATCH;  Surgeon: Angelia Mould, MD;  Location: [facility];  Service: Vascular;  Laterality: Right;  . PERIPHERAL VASCULAR BALLOON ANGIOPLASTY  09/22/2016   Procedure: Peripheral Vascular Balloon Angioplasty;  Surgeon: Serafina Mitchell, MD;  Location: [facility];  Service: Cardiovascular;;  Lt. Fistula  .  PERIPHERAL VASCULAR BALLOON ANGIOPLASTY Left 03/22/2018   Procedure: PERIPHERAL VASCULAR BALLOON ANGIOPLASTY;  Surgeon: Serafina Mitchell, MD;  Location: [facility];  Service: Cardiovascular;  Laterality: Left;  Arm shunt  . PERIPHERAL VASCULAR CATHETERIZATION N/A 06/23/2016   Procedure: Abdominal Aortogram w/Lower Extremity;  Surgeon: Serafina Mitchell, MD;  Location: [facility];  Service: Cardiovascular;  Laterality: N/A;  . PERIPHERAL VASCULAR CATHETERIZATION  06/23/2016   Procedure: Peripheral Vascular Intervention;  Surgeon: Serafina Mitchell, MD;  Location: [facility];  Service: Cardiovascular;;  lt common and external illiac artery    OB History   None      Home Medications    Prior to Admission medications   Medication Sig Start Date End Date Taking? Authorizing Provider  isosorbide-hydrALAZINE (BIDIL) 20-37.5 MG tablet Take by mouth 3 (three) times daily.   Yes [provider]  albuterol (PROVENTIL HFA;VENTOLIN HFA) 108 (90 Base) MCG/ACT inhaler Inhale 1-2 puffs into the lungs every 6 (six) hours as needed for wheezing or shortness of breath. 01/23/18   Allie Bossier, MD  Amino Acids-Protein Hydrolys (FEEDING SUPPLEMENT, PRO-STAT SUGAR FREE 64,) LIQD Take 30 mLs by mouth 2 (two) times daily. 09/16/17   Love, Ivan Anchors, PA-C  aspirin EC 81 MG tablet Take 81 mg by mouth daily.    [provider]  atorvastatin (LIPITOR) 20 MG tablet Take 20 mg by mouth at bedtime.     [provider]  budesonide-formoterol (SYMBICORT) 160-4.5 MCG/ACT inhaler Inhale 1 puff into the lungs daily as needed (for respiratory issues).     [provider]  carvedilol (COREG) 12.5 MG tablet Take 12.5 mg by mouth 2 (two) times daily with a meal.    [provider]  cinacalcet (SENSIPAR) 60 MG tablet Take 60 mg by mouth daily. Pt. Gets at dialysis 01/06/18   [provider]  clopidogrel (PLAVIX) 75 MG tablet Take 75 mg by mouth daily.     [provider]  Dakins (SODIUM HYPOCHLORITE) external solution Apply topically daily. Cleanse groin and cover with damp gauze soaked in dakins. 09/16/17   Love, Ivan Anchors, PA-C  diazepam (VALIUM) 5 MG tablet Take 5 mg by mouth 2 (two) times daily as needed for anxiety.    [provider]  fluticasone (FLONASE) 50 MCG/ACT nasal spray Place 1 spray into both nostrils daily as needed for allergies or rhinitis.    [provider]  gabapentin (NEURONTIN) 100 MG capsule TAKE 1 CAPSULE (  100 MG TOTAL) BY MOUTH AT BEDTIME. WHEN NECESSARY FOR NEUROPATHY PAIN 01/11/18   Newt Minion, MD  hydrocerin (EUCERIN) CREA Apply 841 application topically 2 (two) times daily. Patient taking differently: Apply 1 application topically 2 (two) times daily.  09/16/17   Love, Ivan Anchors, PA-C  insulin NPH Human (HUMULIN N,NOVOLIN N) 100 UNIT/ML injection Inject 0.1 mLs (10 Units total) into the skin at bedtime. 01/23/18   Allie Bossier, MD  insulin regular (NOVOLIN R,HUMULIN R) 100 units/mL injection Inject 3-8 Units into the skin 3 (three) times daily before meals. Per sliding scale on non-dialysis days    [provider]  LEVEMIR FLEXTOUCH 100 UNIT/ML Pen Inject 7 Units into the skin at bedtime. 09/16/17   Love, Ivan Anchors, PA-C  lidocaine, PF, (XYLOCAINE) 1 % SOLN injection Inject 5 mLs into the skin as needed (topical anesthesia for hemodialysis ifGEBAUERS is ineffective.). 01/23/18   Allie Bossier, MD  lidocaine-prilocaine (EMLA) cream Apply 1 application topically 3 (three) times a week. 1-2 hours prior to dialysis 01/07/18   [provider]  loratadine (CLARITIN) 10 MG tablet Take 10 mg by mouth daily as needed for allergies.    [provider]  multivitamin (RENA-VIT) TABS tablet Take 1 tablet by mouth daily.  01/24/15   [provider]  omeprazole (PRILOSEC) 20 MG capsule Take 20 mg by mouth daily.     [provider]  oxyCODONE-acetaminophen  (PERCOCET/ROXICET) 5-325 MG tablet Take 1 tablet by mouth every 4 (four) hours as needed for severe pain. 12/31/17   Newt Minion, MD  polyethylene glycol [facility] Floria Raveling) packet Take 17 g by mouth daily. Patient taking differently: Take 17 g by mouth daily as needed for moderate constipation.  09/17/17   Bary Leriche, PA-C    Family History Family History  Problem Relation Age of Onset  . Other Mother        not sure of cause of death  . Diabetes Father   . Pancreatic cancer Maternal Grandmother   . Colon cancer Neg Hx     Social History Social History   Tobacco Use  . Smoking status: Former Smoker    Packs/day: 0.35    Years: 40.00    Pack years: 14.00    Types: Cigarettes    Last attempt to quit: 05/10/2012    Years since quitting: 5.9  . Smokeless tobacco: Never Used  Substance Use Topics  . Alcohol use: No  . Drug use: No    Comment: marijuana; quit in early 1980's     Allergies   Prednisone and Lisinopril   Review of Systems Review of Systems  Constitutional: Negative for chills and fever.  HENT: Negative for voice change.   Cardiovascular: Negative for leg swelling.  Musculoskeletal: Positive for arthralgias and joint swelling.  Skin: Negative for color change and rash.       No redness noted  Neurological: Negative for dizziness.  Psychiatric/Behavioral: Negative for confusion.     Physical Exam Triage Vital Signs ED Triage Vitals  Enc Vitals Group     BP 05/02/18 1809 (!) 120/53     Pulse Rate 05/02/18 1809 95     Resp 05/02/18 1809 20     Temp 05/02/18 1809 98.7 F (37.1 C)     Temp Source 05/02/18 1809 Oral     SpO2 05/02/18 1809 99 %     Weight --      Height --  Head Circumference --      Peak Flow --      Pain Score 05/02/18 1806 9     Pain Loc --      Pain Edu? --      Excl. in [place]? --    No data found.  Updated Vital Signs BP (!) 120/53 (BP Location: Right Arm) Comment (BP Location): forearm, regular  Pulse 95   Temp  98.7 F (37.1 C) (Oral)   Resp 20   SpO2 99%   Visual Acuity Right Eye Distance:   Left Eye Distance:   Bilateral Distance:    Right Eye Near:   Left Eye Near:    Bilateral Near:     Physical Exam  Constitutional: She is oriented to person, place, and time. She appears well-developed.  Is in moderate pain when  She moves her R knee  HENT:  Right Ear: External ear normal.  Left Ear: External ear normal.  Nose: Nose normal.  Eyes: Conjunctivae are normal. No scleral icterus.  Neck: Neck supple.  Pulmonary/Chest: Effort normal.  Musculoskeletal: She exhibits tenderness. She exhibits no edema or deformity.  R KNEE- with suprapatellar effusion. Has diffuse tenderness, but more so medially on medial collateral region and medial and lateral joint region and on R posterior region where she has a soft mass which is not present on L posterior knee. ROM is limited due to pain, only could flex to 90 degrees, and could not fully flex it.   Neurological: She is alert and oriented to person, place, and time.  Speech is normal  Skin: Skin is warm and dry. No rash noted. She is not diaphoretic. No erythema.  Psychiatric: She has a normal mood and affect. Her behavior is normal. Thought content normal.  Vitals reviewed.    UC Treatments / Results  Labs (all labs ordered are listed, but only abnormal results are displayed) Labs Reviewed - No data to display  EKG None  Radiology No results found.  Procedures  R medial nee joint area numbed with 1% lidocaine and used 2 ml. Skin was scrubbed with betadine. Sterile technique used. # 23 G 11/2 inc needle used and I aspirated 1 ml of clear serous synovial fluid. Through the same needle, after removing the syringe, I attached the steroid syringe and injected the Kenalog in the R knee joint space.  A steroid injection was performed at the medial R knee joint area using 1 ml 1% plain Lidocaine and 0.25 ml( 10 mg) of kenalog 40mg /ml ( since on  dialysis) . This was well tolerated by pt. There was minimal bleeding. Area cleansed and band-aid applied to area.   Medications Ordered in UC Medications  triamcinolone acetonide (KENALOG-40) injection 10 mg (10 mg Intra-articular Given by Other 05/02/18 1855)    Initial Impression / Assessment and Plan / UC Course  I have reviewed the triage vital signs and the nursing notes. She was educated to avoid sweets and white flour which causes increase inflammation.  Also told to watch her glucose closely this week. If high needs to notify her PCP.     Final Clinical Impressions(s) / UC Diagnoses   Final diagnoses:  Acute pain of right knee  Primary osteoarthritis of right knee     Discharge Instructions     1-Watch out for redness, hotness or worsening of knee pain. The back of your R knee has some swelling which feels like may be a Backer's cyst. The orthopedist knows how to  care for this.  2- You may take Tylenol 500 mg 1-2 every 8 hours as needed for pain, until the medication kicks in.     ED Prescriptions    None     Controlled Substance Prescriptions [place] Controlled Substance Registry consulted? no   Shelby Mattocks, Hershal Coria 05/02/18 1936

## 2018-05-03 DIAGNOSIS — E113512 Type 2 diabetes mellitus with proliferative diabetic retinopathy with macular edema, left eye: Secondary | ICD-10-CM | POA: Diagnosis not present

## 2018-05-04 DIAGNOSIS — E876 Hypokalemia: Secondary | ICD-10-CM | POA: Diagnosis not present

## 2018-05-04 DIAGNOSIS — D631 Anemia in chronic kidney disease: Secondary | ICD-10-CM | POA: Diagnosis not present

## 2018-05-04 DIAGNOSIS — N2581 Secondary hyperparathyroidism of renal origin: Secondary | ICD-10-CM | POA: Diagnosis not present

## 2018-05-04 DIAGNOSIS — E1122 Type 2 diabetes mellitus with diabetic chronic kidney disease: Secondary | ICD-10-CM | POA: Diagnosis not present

## 2018-05-04 DIAGNOSIS — M0000 Staphylococcal arthritis, unspecified joint: Secondary | ICD-10-CM | POA: Diagnosis not present

## 2018-05-04 DIAGNOSIS — N186 End stage renal disease: Secondary | ICD-10-CM | POA: Diagnosis not present

## 2018-05-05 ENCOUNTER — Ambulatory Visit (INDEPENDENT_AMBULATORY_CARE_PROVIDER_SITE_OTHER): Payer: Self-pay

## 2018-05-05 ENCOUNTER — Other Ambulatory Visit: Payer: Self-pay

## 2018-05-05 ENCOUNTER — Encounter (INDEPENDENT_AMBULATORY_CARE_PROVIDER_SITE_OTHER): Payer: Self-pay | Admitting: Orthopedic Surgery

## 2018-05-05 ENCOUNTER — Ambulatory Visit (INDEPENDENT_AMBULATORY_CARE_PROVIDER_SITE_OTHER): Payer: Self-pay | Admitting: Physician Assistant

## 2018-05-05 ENCOUNTER — Ambulatory Visit (INDEPENDENT_AMBULATORY_CARE_PROVIDER_SITE_OTHER): Payer: Medicare Other | Admitting: Orthopedic Surgery

## 2018-05-05 ENCOUNTER — Encounter (HOSPITAL_COMMUNITY): Payer: Self-pay | Admitting: *Deleted

## 2018-05-05 DIAGNOSIS — Z794 Long term (current) use of insulin: Secondary | ICD-10-CM | POA: Diagnosis not present

## 2018-05-05 DIAGNOSIS — I7025 Atherosclerosis of native arteries of other extremities with ulceration: Secondary | ICD-10-CM

## 2018-05-05 DIAGNOSIS — E1121 Type 2 diabetes mellitus with diabetic nephropathy: Secondary | ICD-10-CM | POA: Diagnosis not present

## 2018-05-05 DIAGNOSIS — Z992 Dependence on renal dialysis: Secondary | ICD-10-CM

## 2018-05-05 DIAGNOSIS — M25561 Pain in right knee: Secondary | ICD-10-CM

## 2018-05-05 DIAGNOSIS — M009 Pyogenic arthritis, unspecified: Secondary | ICD-10-CM | POA: Diagnosis not present

## 2018-05-05 DIAGNOSIS — N186 End stage renal disease: Secondary | ICD-10-CM

## 2018-05-05 NOTE — Progress Notes (Signed)
Spoke with pt for pre-op call. Pt denies cardiac history or chest pain. Has occasional shortness of breath with exertion. Pt is a type 2 diabetic. Last A1C in Epic was 6.4 on 12/31/17. She states she gets one done at dialysis once a month, had one done today but has not looked at the result yet. Pt states her fasting blood sugar is usually between 85-120. Pt instructed to take 1/2 of her regular dose of her Humulin N insulin this evening (she will take 5 units). Instructed pt to check her blood sugar in the AM when she gets up and every 2 hours until she leaves for the hospital. If blood sugar is >220 take 1/2 of usual correction dose of Novolin R insulin. If blood sugar is 70 or below, treat with 1/2 cup of clear juice (apple or cranberry) and recheck blood sugar 15 minutes after drinking juice. If blood sugar continues to be 70 or below, call the Short Stay department and ask to speak to a nurse. Pt voiced understanding.

## 2018-05-05 NOTE — Progress Notes (Signed)
Office Visit Note   Patient: Destiny Day           Date of Birth: 18-Feb-1951           MRN: [id number] Visit Date: 05/05/2018              Requested by: Velna Hatchet, MD [address] [place], [place] [address] PCP: Velna Hatchet, MD  Chief Complaint  Patient presents with  . Right Knee - Pain    Pain/swelling x 4 days.  S/p aspiration/injection 05/02/18.      HPI: Patient is a 67 year old female with a previous right transmetatarsal amputation who presents with new acute onset of right knee pain.  She reports abrupt onset of the pain over the weekend and she presented to the emergency room where the knee was aspirated and she was given a Kenalog injection.  She returns today saying the knee pain is even worse and she feels like there is more fluid.  She has end-stage renal disease and is on hemodialysis Monday Wednesday Friday at the [facility] kidney center.  Assessment & Plan: Visit Diagnoses:  1. Pyogenic arthritis of right knee joint, due to unspecified organism ([place])   2. Acute pain of right knee   3. Type 2 diabetes mellitus with diabetic nephropathy, with long-term current use of insulin ([place])   4. End-stage renal disease on hemodialysis ([place])     Plan: After informed consent the right knee was aspirated for 30 cc of cloudy purulent appearing serous drainage.  This was sent for culture.  We are going to set the patient up for a right knee scope for washout of her right septic knee tomorrow in the OR and will plan to admit for at least 23-hour observation as she will be do hemodialysis tomorrow.  Elmyra Ricks at the [facility] was contacted concerning the patient's need for hemodialysis while hospitalized and she is going to notify the nephrologist at [facility] of the patient's impending surgery and need for hemodialysis while hospitalized.  She will follow-up next week postoperatively.  Follow-Up Instructions: Return in about 2 weeks (around  05/19/2018).   Ortho Exam  Patient is alert, oriented, no adenopathy, well-dressed, normal affect, normal respiratory effort. The right knee has a large effusion and she is tender with range of motion and to palpation over the posterior knee joint.  After informed consent the right knee was sterilely prepped and anesthetized with 1% lidocaine x1 cc to the skin.  The knee was then aspirated for 30 cc of cloudy purulent serous drainage which was sent for culture.  Imaging: Xr Knee 1-2 Views Right  Result Date: 05/05/2018 Radiographs of the right knee show tricompartmental degenerative changes with loose osteophyte in the posterior knee as well as osteophytes over the patella.  Calcified vessels noted as well.  No evidence for fracture no evidence for lytic lesions.  Radiographs were reviewed with Dr. Sharol Given.  No images are attached to the encounter.  Labs: Lab Results  Component Value Date   HGBA1C 6.4 (H) 12/31/2017   HGBA1C 5.0 09/03/2017   HGBA1C 7.0 (H) 08/11/2017   REPTSTATUS 08/25/2017 FINAL 08/24/2017   CULT NO GROWTH 08/24/2017   LABORGA STAPHYLOCOCCUS AUREUS 11/29/2008     Lab Results  Component Value Date   ALBUMIN 2.7 (L) 01/22/2018   ALBUMIN 2.5 (L) 09/13/2017   ALBUMIN 2.5 (L) 09/10/2017    There is no height or weight on file to calculate BMI.  Orders:  Orders Placed  This Encounter  Procedures  . Anaerobic and Aerobic Culture  . XR Knee 1-2 Views Right   No orders of the defined types were placed in this encounter.    Procedures: No procedures performed  Clinical Data: No additional findings.  ROS:  All other systems negative, except as noted in the HPI. Review of Systems  Objective: Vital Signs: There were no vitals taken for this visit.  Specialty Comments:  No specialty comments available.  PMFS History: Patient Active Problem List   Diagnosis Date Noted  . Acute pulmonary edema ([place]) 02/21/2018  . History of transmetatarsal amputation of  right foot ([place]) 02/03/2018  . End-stage renal disease on hemodialysis ([place])   . Respiratory failure ([place]) 01/21/2018  . Achilles tendon contracture, right 11/09/2017  . Labile blood glucose   . Anemia of infection and chronic disease   . Essential hypertension   . Black stool   . Right sided weakness   . Subdural hemorrhage following injury ([place]) 08/30/2017  . Diabetic peripheral neuropathy ([place])   . Chronic obstructive pulmonary disease ([place])   . Seizure prophylaxis   . Subdural hematoma ([place]) 08/24/2017  . Traumatic subdural hematoma ([place]) 08/23/2017  . Fall   . SDH (subdural hematoma) ([place])   . Acute respiratory failure with hypoxia ([place])   . Diabetes mellitus type 2 in nonobese (HCC)   . Leukocytosis   . Labile blood pressure   . Generalized anxiety disorder   . Debility 08/16/2017  . Amputated great toe of right foot ([place])   . Amputated toe of left foot ([place])   . Post-operative pain   . ESRD on dialysis ([place])   . Diabetes mellitus ([place])   . Anxiety state   . Benign essential HTN   . Acute blood loss anemia   . Anemia of chronic disease   . Idiopathic chronic venous hypertension of both lower extremities with inflammation 10/13/2016  . S/P transmetatarsal amputation of foot, left ([place]) 09/21/2016  . Onychomycosis 08/15/2016  . PAD (peripheral artery disease) ([place]) 07/08/2016  . Atherosclerosis of native artery of left lower extremity with gangrene ([place]) 06/23/2016  . GERD (gastroesophageal reflux disease) 10/07/2015  . ARF (acute renal failure) ([place])   . Hypothermia 06/12/2015  . Acute on chronic renal failure ([place]) 06/12/2015  . CHF (congestive heart failure) ([place]) 07/30/2014  . CKD (chronic kidney disease) stage 4, GFR 15-29 ml/min (HCC) 06/27/2014  . Hypertensive heart disease 05/25/2013  . CKD (chronic kidney disease) stage 3, GFR 30-59 ml/min (HCC) 05/25/2013  . Pulmonary edema 05/23/2013  . Type 2 diabetes mellitus with diabetic nephropathy ([place]) 05/23/2013    . Type 2 diabetes mellitus with hyperosmolar nonketotic hyperglycemia ([place]) 04/13/2013  . Chronic diastolic CHF (congestive heart failure) ([place]) 04/13/2013  . UGI bleed 03/31/2013  . Hypokalemia 08/24/2012  . Type II or unspecified type diabetes mellitus without mention of complication, not stated as uncontrolled 12/27/2007  . HLD (hyperlipidemia) 12/27/2007  . ALLERGIC RHINITIS 12/27/2007  . Asthma 12/27/2007   Past Medical History:  Diagnosis Date  . Abdominal bruit   . Anemia   . Anxiety   . Arthritis    Osteoarthritis  . Asthma   . Cervical disc disease    "pinced nerve"  . CHF (congestive heart failure) ([place])   . Complication of anesthesia    " after I got home from my last procedure, I started itching."  . Diabetes mellitus    Type II  . Diverticulitis   . ESRD  on peritoneal dialysis [facility])    Hemodialysis - MWF- [place]   . GERD (gastroesophageal reflux disease)    from medications  . GI bleed 03/31/2013  . History of hiatal hernia   . Hyperlipidemia   . Hypertension   . Neuropathy    left leg  . Osteoporosis   . Peripheral vascular disease ([place])   . Pneumonia    "very young"  . Seasonal allergies   . Shortness of breath dyspnea    WIth exertion  . Sleep apnea    can't afford cpap    Family History  Problem Relation Age of Onset  . Other Mother        not sure of cause of death  . Diabetes Father   . Pancreatic cancer Maternal Grandmother   . Colon cancer Neg Hx     Past Surgical History:  Procedure Laterality Date  . A/V SHUNTOGRAM N/A 09/22/2016   Procedure: A/V Shuntogram - left arm;  Surgeon: Serafina Mitchell, MD;  Location: [place] CV LAB;  Service: Cardiovascular;  Laterality: N/A;  . A/V SHUNTOGRAM N/A 03/22/2018   Procedure: A/V SHUNTOGRAM - left arm;  Surgeon: Serafina Mitchell, MD;  Location: [place] CV LAB;  Service: Cardiovascular;  Laterality: N/A;  . ABDOMINAL HYSTERECTOMY  1993`  . AMPUTATION Left 09/01/2016   Procedure: LEFT FOOT  TRANSMETATARSAL AMPUTATION;  Surgeon: Newt Minion, MD;  Location: [place];  Service: Orthopedics;  Laterality: Left;  . AMPUTATION Right 08/11/2017   Procedure: RIGHT GREAT TOE AMPUTATION DIGIT;  Surgeon: Rosetta Posner, MD;  Location: [place];  Service: Vascular;  Laterality: Right;  . AMPUTATION Right 12/31/2017   Procedure: RIGHT TRANSMETATARSAL AMPUTATION;  Surgeon: Newt Minion, MD;  Location: [place];  Service: Orthopedics;  Laterality: Right;  . AV FISTULA PLACEMENT Left 04/21/2016   Procedure: INSERTION OF ARTERIOVENOUS (AV) GORE-TEX GRAFT ARM LEFT;  Surgeon: Elam Dutch, MD;  Location: [place];  Service: Vascular;  Laterality: Left;  . [place] TRANSPOSITION Left 07/10/2014   Procedure: BASCILIC VEIN TRANSPOSITION;  Surgeon: Angelia Mould, MD;  Location: [place];  Service: Vascular;  Laterality: Left;  . BASCILIC VEIN TRANSPOSITION Right 11/08/2014   Procedure: FIRST STAGE BASILIC VEIN TRANSPOSITION;  Surgeon: Angelia Mould, MD;  Location: [place];  Service: Vascular;  Laterality: Right;  . BASCILIC VEIN TRANSPOSITION Right 01/18/2015   Procedure: SECOND STAGE BASILIC VEIN TRANSPOSITION;  Surgeon: Angelia Mould, MD;  Location: [place];  Service: Vascular;  Laterality: Right;  . CRANIOTOMY N/A 08/23/2017   Procedure: CRANIOTOMY HEMATOMA EVACUATION SUBDURAL;  Surgeon: Ashok Pall, MD;  Location: [place];  Service: Neurosurgery;  Laterality: N/A;  . CRANIOTOMY Left 08/24/2017   Procedure: CRANIOTOMY FOR RECURRENT ACUTE SUBDURAL HEMATOMA;  Surgeon: Ashok Pall, MD;  Location: [place];  Service: Neurosurgery;  Laterality: Left;  . ESOPHAGOGASTRODUODENOSCOPY N/A 03/31/2013   Procedure: ESOPHAGOGASTRODUODENOSCOPY (EGD);  Surgeon: Gatha Mayer, MD;  [facility] ENDOSCOPY;  Service: Endoscopy;  Laterality: N/A;  . EYE SURGERY     laser surgery  . FEMORAL-POPLITEAL BYPASS GRAFT Left 07/08/2016   Procedure: LEFT  FEMORAL-BELOW KNEE POPLITEAL ARTERY BYPASS GRAFT USING 6MM X 80 CM  PROPATEN GORETEX GRAFT WITH RINGS.;  Surgeon: Rosetta Posner, MD;  Location: [place];  Service: Vascular;  Laterality: Left;  . FEMORAL-POPLITEAL BYPASS GRAFT Right 08/11/2017   Procedure: RIGHT FEMORAL TO BELOW KNEE POPLITEAL ARTERKY  BYPASS GRAFT USING 6MM RINGED PROPATEN GRAFT;  Surgeon: Rosetta Posner,  MD;  Location: [place];  Service: Vascular;  Laterality: Right;  . FISTULOGRAM Left 10/29/2014   Procedure: FISTULOGRAM;  Surgeon: Angelia Mould, MD;  Location: [facility] CATH LAB;  Service: Cardiovascular;  Laterality: Left;  . LIGATION OF ARTERIOVENOUS  FISTULA Left 04/21/2016   Procedure: LIGATION OF ARTERIOVENOUS  FISTULA LEFT ARM;  Surgeon: Elam Dutch, MD;  Location: [place];  Service: Vascular;  Laterality: Left;  . LOWER EXTREMITY ANGIOGRAPHY N/A 04/06/2017   Procedure: Lower Extremity Angiography - Right;  Surgeon: Serafina Mitchell, MD;  Location: [place] CV LAB;  Service: Cardiovascular;  Laterality: N/A;  . PATCH ANGIOPLASTY Right 01/18/2015   Procedure: BASILIC VEIN PATCH ANGIOPLASTY USING VASCUGUARD PATCH;  Surgeon: Angelia Mould, MD;  Location: [place];  Service: Vascular;  Laterality: Right;  . PERIPHERAL VASCULAR BALLOON ANGIOPLASTY  09/22/2016   Procedure: Peripheral Vascular Balloon Angioplasty;  Surgeon: Serafina Mitchell, MD;  Location: [place] [facility] LAB;  Service: Cardiovascular;;  Lt. Fistula  . PERIPHERAL VASCULAR BALLOON ANGIOPLASTY Left 03/22/2018   Procedure: PERIPHERAL VASCULAR BALLOON ANGIOPLASTY;  Surgeon: Serafina Mitchell, MD;  Location: [place] CV LAB;  Service: Cardiovascular;  Laterality: Left;  Arm shunt  . PERIPHERAL VASCULAR CATHETERIZATION N/A 06/23/2016   Procedure: Abdominal Aortogram w/Lower Extremity;  Surgeon: Serafina Mitchell, MD;  Location: [place] CV LAB;  Service: Cardiovascular;  Laterality: N/A;  . PERIPHERAL VASCULAR CATHETERIZATION  06/23/2016   Procedure: Peripheral Vascular Intervention;  Surgeon: Serafina Mitchell, MD;  Location: [place]  CV LAB;  Service: Cardiovascular;;  lt common and external illiac artery   Social History   Occupational History  . Occupation: Retired  Tobacco Use  . Smoking status: Former Smoker    Packs/day: 0.35    Years: 40.00    Pack years: 14.00    Types: Cigarettes    Last attempt to quit: 05/10/2012    Years since quitting: 5.9  . Smokeless tobacco: Never Used  Substance and Sexual Activity  . Alcohol use: No  . Drug use: No    Comment: marijuana; quit in early 1980's  . Sexual activity: Yes

## 2018-05-06 ENCOUNTER — Encounter (HOSPITAL_COMMUNITY)
Admission: RE | Disposition: A | Discharge status: Skilled Nursing Facility | Payer: Self-pay | Source: Home / Self Care | Attending: Orthopedic Surgery

## 2018-05-06 ENCOUNTER — Other Ambulatory Visit: Payer: Self-pay

## 2018-05-06 ENCOUNTER — Inpatient Hospital Stay (HOSPITAL_COMMUNITY)
Admission: RE | Admit: 2018-05-06 | Discharge: 2018-05-10 | DRG: 485 | Disposition: A | Discharge status: Skilled Nursing Facility | Payer: Medicare Other | Attending: Orthopedic Surgery | Admitting: Orthopedic Surgery

## 2018-05-06 ENCOUNTER — Ambulatory Visit (HOSPITAL_COMMUNITY): Payer: Medicare Other | Admitting: Anesthesiology

## 2018-05-06 ENCOUNTER — Encounter (HOSPITAL_COMMUNITY): Payer: Self-pay | Admitting: *Deleted

## 2018-05-06 DIAGNOSIS — Z90710 Acquired absence of both cervix and uterus: Secondary | ICD-10-CM

## 2018-05-06 DIAGNOSIS — N184 Chronic kidney disease, stage 4 (severe): Secondary | ICD-10-CM | POA: Diagnosis not present

## 2018-05-06 DIAGNOSIS — Z87891 Personal history of nicotine dependence: Secondary | ICD-10-CM

## 2018-05-06 DIAGNOSIS — M792 Neuralgia and neuritis, unspecified: Secondary | ICD-10-CM | POA: Diagnosis not present

## 2018-05-06 DIAGNOSIS — G9340 Encephalopathy, unspecified: Secondary | ICD-10-CM | POA: Diagnosis not present

## 2018-05-06 DIAGNOSIS — I132 Hypertensive heart and chronic kidney disease with heart failure and with stage 5 chronic kidney disease, or end stage renal disease: Secondary | ICD-10-CM | POA: Diagnosis not present

## 2018-05-06 DIAGNOSIS — M2341 Loose body in knee, right knee: Secondary | ICD-10-CM

## 2018-05-06 DIAGNOSIS — I509 Heart failure, unspecified: Secondary | ICD-10-CM | POA: Diagnosis present

## 2018-05-06 DIAGNOSIS — Z794 Long term (current) use of insulin: Secondary | ICD-10-CM | POA: Diagnosis not present

## 2018-05-06 DIAGNOSIS — G9341 Metabolic encephalopathy: Secondary | ICD-10-CM | POA: Diagnosis not present

## 2018-05-06 DIAGNOSIS — N2581 Secondary hyperparathyroidism of renal origin: Secondary | ICD-10-CM | POA: Diagnosis not present

## 2018-05-06 DIAGNOSIS — M2550 Pain in unspecified joint: Secondary | ICD-10-CM | POA: Diagnosis not present

## 2018-05-06 DIAGNOSIS — E1122 Type 2 diabetes mellitus with diabetic chronic kidney disease: Secondary | ICD-10-CM | POA: Diagnosis not present

## 2018-05-06 DIAGNOSIS — G4733 Obstructive sleep apnea (adult) (pediatric): Secondary | ICD-10-CM | POA: Diagnosis present

## 2018-05-06 DIAGNOSIS — K219 Gastro-esophageal reflux disease without esophagitis: Secondary | ICD-10-CM | POA: Diagnosis present

## 2018-05-06 DIAGNOSIS — E785 Hyperlipidemia, unspecified: Secondary | ICD-10-CM | POA: Diagnosis present

## 2018-05-06 DIAGNOSIS — G9389 Other specified disorders of brain: Secondary | ICD-10-CM | POA: Diagnosis not present

## 2018-05-06 DIAGNOSIS — F419 Anxiety disorder, unspecified: Secondary | ICD-10-CM | POA: Diagnosis present

## 2018-05-06 DIAGNOSIS — M1711 Unilateral primary osteoarthritis, right knee: Secondary | ICD-10-CM | POA: Diagnosis present

## 2018-05-06 DIAGNOSIS — Z888 Allergy status to other drugs, medicaments and biological substances status: Secondary | ICD-10-CM | POA: Diagnosis not present

## 2018-05-06 DIAGNOSIS — Z992 Dependence on renal dialysis: Secondary | ICD-10-CM | POA: Diagnosis not present

## 2018-05-06 DIAGNOSIS — Z4789 Encounter for other orthopedic aftercare: Secondary | ICD-10-CM | POA: Diagnosis not present

## 2018-05-06 DIAGNOSIS — Z89411 Acquired absence of right great toe: Secondary | ICD-10-CM | POA: Diagnosis not present

## 2018-05-06 DIAGNOSIS — Z89421 Acquired absence of other right toe(s): Secondary | ICD-10-CM

## 2018-05-06 DIAGNOSIS — R110 Nausea: Secondary | ICD-10-CM | POA: Diagnosis not present

## 2018-05-06 DIAGNOSIS — M778 Other enthesopathies, not elsewhere classified: Secondary | ICD-10-CM | POA: Diagnosis present

## 2018-05-06 DIAGNOSIS — B9689 Other specified bacterial agents as the cause of diseases classified elsewhere: Secondary | ICD-10-CM | POA: Diagnosis not present

## 2018-05-06 DIAGNOSIS — K5900 Constipation, unspecified: Secondary | ICD-10-CM | POA: Diagnosis not present

## 2018-05-06 DIAGNOSIS — Z833 Family history of diabetes mellitus: Secondary | ICD-10-CM

## 2018-05-06 DIAGNOSIS — Z800 Family history of malignant neoplasm of digestive organs: Secondary | ICD-10-CM

## 2018-05-06 DIAGNOSIS — Z9119 Patient's noncompliance with other medical treatment and regimen: Secondary | ICD-10-CM

## 2018-05-06 DIAGNOSIS — R062 Wheezing: Secondary | ICD-10-CM | POA: Diagnosis not present

## 2018-05-06 DIAGNOSIS — M23221 Derangement of posterior horn of medial meniscus due to old tear or injury, right knee: Secondary | ICD-10-CM

## 2018-05-06 DIAGNOSIS — M23321 Other meniscus derangements, posterior horn of medial meniscus, right knee: Secondary | ICD-10-CM

## 2018-05-06 DIAGNOSIS — E1121 Type 2 diabetes mellitus with diabetic nephropathy: Secondary | ICD-10-CM | POA: Diagnosis present

## 2018-05-06 DIAGNOSIS — M00061 Staphylococcal arthritis, right knee: Secondary | ICD-10-CM | POA: Diagnosis not present

## 2018-05-06 DIAGNOSIS — I739 Peripheral vascular disease, unspecified: Secondary | ICD-10-CM | POA: Diagnosis not present

## 2018-05-06 DIAGNOSIS — M810 Age-related osteoporosis without current pathological fracture: Secondary | ICD-10-CM | POA: Diagnosis present

## 2018-05-06 DIAGNOSIS — M25761 Osteophyte, right knee: Secondary | ICD-10-CM | POA: Diagnosis present

## 2018-05-06 DIAGNOSIS — B9561 Methicillin susceptible Staphylococcus aureus infection as the cause of diseases classified elsewhere: Secondary | ICD-10-CM | POA: Diagnosis present

## 2018-05-06 DIAGNOSIS — R278 Other lack of coordination: Secondary | ICD-10-CM | POA: Diagnosis not present

## 2018-05-06 DIAGNOSIS — Z96651 Presence of right artificial knee joint: Secondary | ICD-10-CM | POA: Diagnosis not present

## 2018-05-06 DIAGNOSIS — M6281 Muscle weakness (generalized): Secondary | ICD-10-CM | POA: Diagnosis not present

## 2018-05-06 DIAGNOSIS — Z7401 Bed confinement status: Secondary | ICD-10-CM | POA: Diagnosis not present

## 2018-05-06 DIAGNOSIS — E119 Type 2 diabetes mellitus without complications: Secondary | ICD-10-CM | POA: Diagnosis not present

## 2018-05-06 DIAGNOSIS — J449 Chronic obstructive pulmonary disease, unspecified: Secondary | ICD-10-CM | POA: Diagnosis not present

## 2018-05-06 DIAGNOSIS — M23200 Derangement of unspecified lateral meniscus due to old tear or injury, right knee: Secondary | ICD-10-CM

## 2018-05-06 DIAGNOSIS — I6789 Other cerebrovascular disease: Secondary | ICD-10-CM | POA: Diagnosis not present

## 2018-05-06 DIAGNOSIS — R0602 Shortness of breath: Secondary | ICD-10-CM | POA: Diagnosis not present

## 2018-05-06 DIAGNOSIS — M009 Pyogenic arthritis, unspecified: Secondary | ICD-10-CM | POA: Diagnosis present

## 2018-05-06 DIAGNOSIS — D631 Anemia in chronic kidney disease: Secondary | ICD-10-CM | POA: Diagnosis not present

## 2018-05-06 DIAGNOSIS — M65861 Other synovitis and tenosynovitis, right lower leg: Secondary | ICD-10-CM | POA: Diagnosis present

## 2018-05-06 DIAGNOSIS — Z89422 Acquired absence of other left toe(s): Secondary | ICD-10-CM

## 2018-05-06 DIAGNOSIS — J309 Allergic rhinitis, unspecified: Secondary | ICD-10-CM | POA: Diagnosis not present

## 2018-05-06 DIAGNOSIS — E1151 Type 2 diabetes mellitus with diabetic peripheral angiopathy without gangrene: Secondary | ICD-10-CM | POA: Diagnosis present

## 2018-05-06 DIAGNOSIS — R410 Disorientation, unspecified: Secondary | ICD-10-CM | POA: Diagnosis not present

## 2018-05-06 DIAGNOSIS — N186 End stage renal disease: Secondary | ICD-10-CM | POA: Diagnosis not present

## 2018-05-06 DIAGNOSIS — N183 Chronic kidney disease, stage 3 (moderate): Secondary | ICD-10-CM | POA: Diagnosis not present

## 2018-05-06 DIAGNOSIS — R9082 White matter disease, unspecified: Secondary | ICD-10-CM | POA: Diagnosis not present

## 2018-05-06 DIAGNOSIS — I10 Essential (primary) hypertension: Secondary | ICD-10-CM | POA: Diagnosis not present

## 2018-05-06 DIAGNOSIS — Z471 Aftercare following joint replacement surgery: Secondary | ICD-10-CM | POA: Diagnosis not present

## 2018-05-06 DIAGNOSIS — E8889 Other specified metabolic disorders: Secondary | ICD-10-CM | POA: Diagnosis present

## 2018-05-06 DIAGNOSIS — R509 Fever, unspecified: Secondary | ICD-10-CM | POA: Diagnosis not present

## 2018-05-06 DIAGNOSIS — M00861 Arthritis due to other bacteria, right knee: Secondary | ICD-10-CM | POA: Diagnosis not present

## 2018-05-06 DIAGNOSIS — M25461 Effusion, right knee: Secondary | ICD-10-CM | POA: Diagnosis present

## 2018-05-06 DIAGNOSIS — M25561 Pain in right knee: Secondary | ICD-10-CM | POA: Diagnosis not present

## 2018-05-06 DIAGNOSIS — I120 Hypertensive chronic kidney disease with stage 5 chronic kidney disease or end stage renal disease: Secondary | ICD-10-CM | POA: Diagnosis not present

## 2018-05-06 DIAGNOSIS — M23251 Derangement of posterior horn of lateral meniscus due to old tear or injury, right knee: Secondary | ICD-10-CM | POA: Diagnosis not present

## 2018-05-06 DIAGNOSIS — Z7901 Long term (current) use of anticoagulants: Secondary | ICD-10-CM | POA: Diagnosis not present

## 2018-05-06 DIAGNOSIS — I130 Hypertensive heart and chronic kidney disease with heart failure and stage 1 through stage 4 chronic kidney disease, or unspecified chronic kidney disease: Secondary | ICD-10-CM | POA: Diagnosis not present

## 2018-05-06 HISTORY — PX: KNEE ARTHROSCOPY: SHX127

## 2018-05-06 LAB — GLUCOSE, CAPILLARY
Glucose-Capillary: 126 mg/dL — ABNORMAL HIGH (ref 70–99)
Glucose-Capillary: 125 mg/dL — ABNORMAL HIGH (ref 70–99)
Glucose-Capillary: 126 mg/dL — ABNORMAL HIGH (ref 70–99)
Glucose-Capillary: 140 mg/dL — ABNORMAL HIGH (ref 70–99)
Glucose-Capillary: 94 mg/dL (ref 70–99)
Glucose-Capillary: 101 mg/dL — ABNORMAL HIGH (ref 70–99)

## 2018-05-06 LAB — RENAL FUNCTION PANEL
Albumin: 2.6 g/dL — ABNORMAL LOW (ref 3.5–5.0)
Anion gap: 14 (ref 5–15)
BUN: 41 mg/dL — ABNORMAL HIGH (ref 8–23)
CO2: 24 mmol/L (ref 22–32)
Calcium: 8.8 mg/dL — ABNORMAL LOW (ref 8.9–10.3)
Chloride: 92 mmol/L — ABNORMAL LOW (ref 98–111)
Creatinine, Ser: 5.62 mg/dL — ABNORMAL HIGH (ref 0.44–1.00)
GFR calc Af Amer: 8 mL/min — ABNORMAL LOW (ref >60)
GFR calc non Af Amer: 7 mL/min — ABNORMAL LOW (ref >60)
Glucose, Bld: 121 mg/dL — ABNORMAL HIGH (ref 70–99)
Phosphorus: 7.5 mg/dL — ABNORMAL HIGH (ref 2.5–4.6)
Potassium: 4.3 mmol/L (ref 3.5–5.1)
Sodium: 130 mmol/L — ABNORMAL LOW (ref 135–145)

## 2018-05-06 LAB — POCT I-STAT 4, (NA,K, GLUC, HGB,HCT)
Glucose, Bld: 150 mg/dL — ABNORMAL HIGH (ref 70–99)
HCT: 33 % — ABNORMAL LOW (ref 36.0–46.0)
Hemoglobin: 11.2 g/dL — ABNORMAL LOW (ref 12.0–15.0)
Potassium: 4.4 mmol/L (ref 3.5–5.1)
Sodium: 128 mmol/L — ABNORMAL LOW (ref 135–145)

## 2018-05-06 LAB — CBC
HCT: 29.7 % — ABNORMAL LOW (ref 36.0–46.0)
Hemoglobin: 9.5 g/dL — ABNORMAL LOW (ref 12.0–15.0)
MCH: 30 pg (ref 26.0–34.0)
MCHC: 32 g/dL (ref 30.0–36.0)
MCV: 93.7 fL (ref 80.0–100.0)
Platelets: 305 10*3/uL (ref 150–400)
RBC: 3.17 MIL/uL — ABNORMAL LOW (ref 3.87–5.11)
RDW: 13.8 % (ref 11.5–15.5)
WBC: 10.9 10*3/uL — ABNORMAL HIGH (ref 4.0–10.5)
nRBC: 0 % (ref 0.0–0.2)

## 2018-05-06 LAB — HEMOGLOBIN A1C
Hgb A1c MFr Bld: 7.2 % — ABNORMAL HIGH (ref 4.8–5.6)
Mean Plasma Glucose: 159.94 mg/dL

## 2018-05-06 SURGERY — ARTHROSCOPY, KNEE
Anesthesia: General | Site: Knee | Laterality: Right

## 2018-05-06 MED ORDER — SODIUM CHLORIDE 0.9 % IR SOLN
Status: DC | PRN
  Administered 2018-05-06 (×3): 3000 mL

## 2018-05-06 MED ORDER — PANTOPRAZOLE SODIUM 40 MG PO TBEC
40.0000 mg | DELAYED_RELEASE_TABLET | Freq: Every day | ORAL | Status: DC
  Administered 2018-05-07 – 2018-05-10 (×4): 40 mg via ORAL
  Filled 2018-05-06 (×4): qty 1

## 2018-05-06 MED ORDER — INSULIN ASPART 100 UNIT/ML ~~LOC~~ SOLN
3.0000 [IU] | Freq: Three times a day (TID) | SUBCUTANEOUS | Status: DC
  Administered 2018-05-06 – 2018-05-10 (×6): 3 [IU] via SUBCUTANEOUS

## 2018-05-06 MED ORDER — ATORVASTATIN CALCIUM 20 MG PO TABS
20.0000 mg | ORAL_TABLET | Freq: Every day | ORAL | Status: DC
  Administered 2018-05-06 – 2018-05-09 (×4): 20 mg via ORAL
  Filled 2018-05-06 (×5): qty 1

## 2018-05-06 MED ORDER — METHOCARBAMOL 1000 MG/10ML IJ SOLN
500.0000 mg | Freq: Four times a day (QID) | INTRAVENOUS | Status: DC | PRN
  Filled 2018-05-06: qty 5

## 2018-05-06 MED ORDER — DOCUSATE SODIUM 100 MG PO CAPS
100.0000 mg | ORAL_CAPSULE | Freq: Two times a day (BID) | ORAL | Status: DC
  Administered 2018-05-06 – 2018-05-10 (×8): 100 mg via ORAL
  Filled 2018-05-06 (×10): qty 1

## 2018-05-06 MED ORDER — GABAPENTIN 100 MG PO CAPS
100.0000 mg | ORAL_CAPSULE | Freq: Every day | ORAL | Status: DC
  Administered 2018-05-06 – 2018-05-09 (×4): 100 mg via ORAL
  Filled 2018-05-06 (×5): qty 1

## 2018-05-06 MED ORDER — VANCOMYCIN HCL IN DEXTROSE 500-5 MG/100ML-% IV SOLN
500.0000 mg | INTRAVENOUS | Status: DC
  Administered 2018-05-09: 500 mg via INTRAVENOUS
  Filled 2018-05-06 (×3): qty 100

## 2018-05-06 MED ORDER — MOMETASONE FURO-FORMOTEROL FUM 200-5 MCG/ACT IN AERO
2.0000 | INHALATION_SPRAY | Freq: Two times a day (BID) | RESPIRATORY_TRACT | Status: DC
  Administered 2018-05-06 – 2018-05-10 (×7): 2 via RESPIRATORY_TRACT
  Filled 2018-05-06 (×3): qty 8.8

## 2018-05-06 MED ORDER — PROPOFOL 10 MG/ML IV BOLUS
INTRAVENOUS | Status: DC | PRN
  Administered 2018-05-06: 120 mg via INTRAVENOUS

## 2018-05-06 MED ORDER — FENTANYL CITRATE (PF) 100 MCG/2ML IJ SOLN
INTRAMUSCULAR | Status: DC | PRN
  Administered 2018-05-06: 100 ug via INTRAVENOUS
  Administered 2018-05-06: 50 ug via INTRAVENOUS

## 2018-05-06 MED ORDER — DIAZEPAM 5 MG PO TABS
5.0000 mg | ORAL_TABLET | Freq: Two times a day (BID) | ORAL | Status: DC | PRN
  Administered 2018-05-08 – 2018-05-10 (×3): 5 mg via ORAL
  Filled 2018-05-06 (×3): qty 1

## 2018-05-06 MED ORDER — MAGNESIUM CITRATE PO SOLN: 1.0000 | Freq: Once | ORAL | Status: DC | PRN

## 2018-05-06 MED ORDER — FENTANYL CITRATE (PF) 100 MCG/2ML IJ SOLN
25.0000 ug | INTRAMUSCULAR | Status: DC | PRN
  Administered 2018-05-06: 25 ug via INTRAVENOUS
  Filled 2018-05-06: qty 2

## 2018-05-06 MED ORDER — CHLORHEXIDINE GLUCONATE 4 % EX LIQD: 60.0000 mL | Freq: Once | CUTANEOUS | Status: DC

## 2018-05-06 MED ORDER — LIDOCAINE 2% (20 MG/ML) 5 ML SYRINGE
INTRAMUSCULAR | Status: DC | PRN
  Administered 2018-05-06: 100 mg via INTRAVENOUS

## 2018-05-06 MED ORDER — METOCLOPRAMIDE HCL 5 MG PO TABS
5.0000 mg | ORAL_TABLET | Freq: Three times a day (TID) | ORAL | Status: DC | PRN
  Administered 2018-05-09: 10 mg via ORAL
  Filled 2018-05-06: qty 2

## 2018-05-06 MED ORDER — HYDROMORPHONE HCL 1 MG/ML IJ SOLN
0.5000 mg | INTRAMUSCULAR | Status: DC | PRN
  Filled 2018-05-06: qty 1

## 2018-05-06 MED ORDER — SODIUM CHLORIDE 0.9 % IV SOLN: 2.0000 g | INTRAVENOUS | Status: DC

## 2018-05-06 MED ORDER — OXYCODONE HCL 5 MG PO TABS
5.0000 mg | ORAL_TABLET | ORAL | Status: DC | PRN
  Administered 2018-05-07: 10 mg via ORAL
  Filled 2018-05-06 (×3): qty 2

## 2018-05-06 MED ORDER — METOCLOPRAMIDE HCL 5 MG/ML IJ SOLN
5.0000 mg | Freq: Three times a day (TID) | INTRAMUSCULAR | Status: DC | PRN
  Administered 2018-05-07: 10 mg via INTRAVENOUS
  Filled 2018-05-06: qty 2

## 2018-05-06 MED ORDER — RENA-VITE PO TABS
1.0000 | ORAL_TABLET | Freq: Every day | ORAL | Status: DC
  Administered 2018-05-07 – 2018-05-10 (×3): 1 via ORAL
  Filled 2018-05-06 (×3): qty 1

## 2018-05-06 MED ORDER — ONDANSETRON HCL 4 MG/2ML IJ SOLN
INTRAMUSCULAR | Status: DC | PRN
  Administered 2018-05-06: 4 mg via INTRAVENOUS

## 2018-05-06 MED ORDER — ACETAMINOPHEN 10 MG/ML IV SOLN: 1000.0000 mg | Freq: Once | INTRAVENOUS | Status: DC | PRN

## 2018-05-06 MED ORDER — CINACALCET HCL 30 MG PO TABS
60.0000 mg | ORAL_TABLET | Freq: Every day | ORAL | Status: DC
  Administered 2018-05-06 – 2018-05-08 (×3): 60 mg via ORAL
  Filled 2018-05-06 (×3): qty 2

## 2018-05-06 MED ORDER — ONDANSETRON HCL 4 MG/2ML IJ SOLN
4.0000 mg | Freq: Four times a day (QID) | INTRAMUSCULAR | Status: DC | PRN
  Administered 2018-05-06 – 2018-05-07 (×2): 4 mg via INTRAVENOUS
  Filled 2018-05-06 (×2): qty 2

## 2018-05-06 MED ORDER — INSULIN ASPART 100 UNIT/ML ~~LOC~~ SOLN
0.0000 [IU] | Freq: Three times a day (TID) | SUBCUTANEOUS | Status: DC
  Administered 2018-05-06: 1 [IU] via SUBCUTANEOUS
  Administered 2018-05-07: 2 [IU] via SUBCUTANEOUS
  Administered 2018-05-07 – 2018-05-09 (×3): 1 [IU] via SUBCUTANEOUS
  Administered 2018-05-10: 3 [IU] via SUBCUTANEOUS

## 2018-05-06 MED ORDER — PROMETHAZINE HCL 25 MG/ML IJ SOLN: 6.2500 mg | INTRAMUSCULAR | Status: DC | PRN

## 2018-05-06 MED ORDER — OXYCODONE HCL 5 MG PO TABS
10.0000 mg | ORAL_TABLET | ORAL | Status: DC | PRN
  Administered 2018-05-06 (×2): 10 mg via ORAL
  Filled 2018-05-06: qty 2

## 2018-05-06 MED ORDER — VANCOMYCIN HCL 10 G IV SOLR
1500.0000 mg | Freq: Once | INTRAVENOUS | Status: DC
  Filled 2018-05-06: qty 1500

## 2018-05-06 MED ORDER — SODIUM CHLORIDE 0.9 % IV SOLN
INTRAVENOUS | Status: DC
  Administered 2018-05-06: 12:00:00 via INTRAVENOUS

## 2018-05-06 MED ORDER — VANCOMYCIN HCL 10 G IV SOLR
1500.0000 mg | INTRAVENOUS | Status: AC
  Administered 2018-05-06: 1500 mg via INTRAVENOUS
  Filled 2018-05-06 (×2): qty 1500

## 2018-05-06 MED ORDER — SODIUM CHLORIDE 0.9 % IV SOLN
2.0000 g | Freq: Once | INTRAVENOUS | Status: AC
  Administered 2018-05-06: 2 g via INTRAVENOUS
  Filled 2018-05-06: qty 2

## 2018-05-06 MED ORDER — 0.9 % SODIUM CHLORIDE (POUR BTL) OPTIME
TOPICAL | Status: DC | PRN
  Administered 2018-05-06: 1000 mL

## 2018-05-06 MED ORDER — PIPERACILLIN-TAZOBACTAM 3.375 G IVPB
3.3750 g | Freq: Two times a day (BID) | INTRAVENOUS | Status: DC
  Filled 2018-05-06: qty 50

## 2018-05-06 MED ORDER — METHOCARBAMOL 500 MG PO TABS
500.0000 mg | ORAL_TABLET | Freq: Four times a day (QID) | ORAL | Status: DC | PRN
  Administered 2018-05-06: 500 mg via ORAL
  Filled 2018-05-06: qty 1

## 2018-05-06 MED ORDER — CEFAZOLIN SODIUM-DEXTROSE 2-3 GM-%(50ML) IV SOLR
INTRAVENOUS | Status: DC | PRN
  Administered 2018-05-06: 2 g via INTRAVENOUS

## 2018-05-06 MED ORDER — FLUTICASONE PROPIONATE 50 MCG/ACT NA SUSP: 1.0000 | Freq: Every day | NASAL | Status: DC | PRN

## 2018-05-06 MED ORDER — OXYCODONE HCL 5 MG PO TABS
ORAL_TABLET | ORAL | Status: AC
  Administered 2018-05-06: 10 mg via ORAL
  Filled 2018-05-06: qty 10

## 2018-05-06 MED ORDER — FENTANYL CITRATE (PF) 100 MCG/2ML IJ SOLN: 25.0000 ug | INTRAMUSCULAR | Status: DC | PRN

## 2018-05-06 MED ORDER — MIDAZOLAM HCL 2 MG/2ML IJ SOLN
INTRAMUSCULAR | Status: AC
  Filled 2018-05-06: qty 2

## 2018-05-06 MED ORDER — CARVEDILOL 12.5 MG PO TABS
12.5000 mg | ORAL_TABLET | Freq: Two times a day (BID) | ORAL | Status: DC
  Administered 2018-05-06 – 2018-05-10 (×6): 12.5 mg via ORAL
  Filled 2018-05-06 (×6): qty 1

## 2018-05-06 MED ORDER — DARBEPOETIN ALFA 40 MCG/0.4ML IJ SOSY
PREFILLED_SYRINGE | INTRAMUSCULAR | Status: AC
  Administered 2018-05-06: 40 ug via INTRAVENOUS
  Filled 2018-05-06: qty 0.4

## 2018-05-06 MED ORDER — SODIUM CHLORIDE 0.9 % IV SOLN
INTRAVENOUS | Status: DC
  Administered 2018-05-06: 08:00:00 via INTRAVENOUS

## 2018-05-06 MED ORDER — CEFAZOLIN SODIUM 1 G IJ SOLR
INTRAMUSCULAR | Status: AC
  Filled 2018-05-06: qty 20

## 2018-05-06 MED ORDER — BISACODYL 10 MG RE SUPP: 10.0000 mg | Freq: Every day | RECTAL | Status: DC | PRN

## 2018-05-06 MED ORDER — FENTANYL CITRATE (PF) 250 MCG/5ML IJ SOLN
INTRAMUSCULAR | Status: AC
  Filled 2018-05-06: qty 5

## 2018-05-06 MED ORDER — DARBEPOETIN ALFA 40 MCG/0.4ML IJ SOSY
40.0000 ug | PREFILLED_SYRINGE | INTRAMUSCULAR | Status: DC
  Administered 2018-05-06: 40 ug via INTRAVENOUS

## 2018-05-06 MED ORDER — GUAIFENESIN-DM 100-10 MG/5ML PO SYRP
5.0000 mL | ORAL_SOLUTION | ORAL | Status: DC | PRN
  Administered 2018-05-06: 5 mL via ORAL
  Filled 2018-05-06 (×2): qty 5

## 2018-05-06 MED ORDER — DOXERCALCIFEROL 4 MCG/2ML IV SOLN
3.0000 ug | INTRAVENOUS | Status: DC
  Administered 2018-05-09: 3 ug via INTRAVENOUS
  Filled 2018-05-06: qty 2

## 2018-05-06 MED ORDER — INSULIN GLARGINE 100 UNIT/ML ~~LOC~~ SOLN
10.0000 [IU] | Freq: Every day | SUBCUTANEOUS | Status: DC
  Administered 2018-05-06 – 2018-05-09 (×4): 10 [IU] via SUBCUTANEOUS
  Filled 2018-05-06 (×6): qty 0.1

## 2018-05-06 MED ORDER — ONDANSETRON HCL 4 MG PO TABS: 4.0000 mg | ORAL_TABLET | Freq: Four times a day (QID) | ORAL | Status: DC | PRN

## 2018-05-06 MED ORDER — PROPOFOL 10 MG/ML IV BOLUS
INTRAVENOUS | Status: AC
  Filled 2018-05-06: qty 20

## 2018-05-06 MED ORDER — ISOSORB DINITRATE-HYDRALAZINE 20-37.5 MG PO TABS
1.0000 | ORAL_TABLET | Freq: Three times a day (TID) | ORAL | Status: DC
  Administered 2018-05-07 – 2018-05-10 (×7): 1 via ORAL
  Filled 2018-05-06 (×16): qty 1

## 2018-05-06 MED ORDER — OXYCODONE HCL 5 MG PO TABS
10.0000 mg | ORAL_TABLET | Freq: Once | ORAL | Status: AC
  Administered 2018-05-06: 10 mg via ORAL

## 2018-05-06 MED ORDER — LIDOCAINE 2% (20 MG/ML) 5 ML SYRINGE
INTRAMUSCULAR | Status: AC
  Filled 2018-05-06: qty 5

## 2018-05-06 MED ORDER — ONDANSETRON HCL 4 MG/2ML IJ SOLN
INTRAMUSCULAR | Status: AC
  Filled 2018-05-06: qty 2

## 2018-05-06 MED ORDER — ALBUTEROL SULFATE (2.5 MG/3ML) 0.083% IN NEBU: 2.5000 mg | INHALATION_SOLUTION | Freq: Four times a day (QID) | RESPIRATORY_TRACT | Status: DC | PRN

## 2018-05-06 MED ORDER — POLYETHYLENE GLYCOL 3350 17 G PO PACK: 17.0000 g | PACK | Freq: Every day | ORAL | Status: DC | PRN

## 2018-05-06 MED ORDER — CLOPIDOGREL BISULFATE 75 MG PO TABS
75.0000 mg | ORAL_TABLET | Freq: Every day | ORAL | Status: DC
  Administered 2018-05-07 – 2018-05-10 (×3): 75 mg via ORAL
  Filled 2018-05-06 (×4): qty 1

## 2018-05-06 MED ORDER — ASPIRIN EC 81 MG PO TBEC
81.0000 mg | DELAYED_RELEASE_TABLET | Freq: Every day | ORAL | Status: DC
  Administered 2018-05-07 – 2018-05-10 (×3): 81 mg via ORAL
  Filled 2018-05-06 (×4): qty 1

## 2018-05-06 MED ORDER — ACETAMINOPHEN 325 MG PO TABS
325.0000 mg | ORAL_TABLET | Freq: Four times a day (QID) | ORAL | Status: DC | PRN
  Administered 2018-05-07: 650 mg via ORAL
  Administered 2018-05-07: 325 mg via ORAL
  Administered 2018-05-08 – 2018-05-10 (×4): 650 mg via ORAL
  Filled 2018-05-06 (×6): qty 2

## 2018-05-06 SURGICAL SUPPLY — 39 items
BLADE CUDA 5.5 (BLADE) IMPLANT
BLADE GREAT WHITE 4.2 (BLADE) IMPLANT
BNDG COHESIVE 4X5 TAN STRL (GAUZE/BANDAGES/DRESSINGS) ×2 IMPLANT
BNDG COHESIVE 6X5 TAN STRL LF (GAUZE/BANDAGES/DRESSINGS) ×2 IMPLANT
BNDG GAUZE ELAST 4 BULKY (GAUZE/BANDAGES/DRESSINGS) ×2 IMPLANT
BUR OVAL 6.0 (BURR) IMPLANT
CONT SPEC STER OR (MISCELLANEOUS) ×2 IMPLANT
COVER SURGICAL LIGHT HANDLE (MISCELLANEOUS) ×4 IMPLANT
COVER WAND RF STERILE (DRAPES) ×2 IMPLANT
CUFF TOURNIQUET SINGLE 34IN LL (TOURNIQUET CUFF) IMPLANT
CUFF TOURNIQUET SINGLE 44IN (TOURNIQUET CUFF) IMPLANT
DRAPE ARTHROSCOPY W/POUCH 114 (DRAPES) ×2 IMPLANT
DRAPE U-SHAPE 47X51 STRL (DRAPES) ×2 IMPLANT
DRSG EMULSION OIL 3X3 NADH (GAUZE/BANDAGES/DRESSINGS) ×2 IMPLANT
DRSG PAD ABDOMINAL 8X10 ST (GAUZE/BANDAGES/DRESSINGS) ×2 IMPLANT
DURAPREP 26ML APPLICATOR (WOUND CARE) ×2 IMPLANT
GAUZE SPONGE 4X4 12PLY STRL (GAUZE/BANDAGES/DRESSINGS) ×2 IMPLANT
GLOVE BIOGEL PI IND STRL 9 (GLOVE) ×1 IMPLANT
GLOVE BIOGEL PI INDICATOR 9 (GLOVE) ×1
GLOVE SURG ORTHO 9.0 STRL STRW (GLOVE) ×2 IMPLANT
GOWN STRL REUS W/ TWL XL LVL3 (GOWN DISPOSABLE) ×3 IMPLANT
GOWN STRL REUS W/TWL XL LVL3 (GOWN DISPOSABLE) ×3
KIT BASIN OR (CUSTOM PROCEDURE TRAY) ×2 IMPLANT
KIT TURNOVER KIT B (KITS) ×2 IMPLANT
MANIFOLD NEPTUNE II (INSTRUMENTS) ×2 IMPLANT
NEEDLE 18GX1X1/2 (RX/OR ONLY) (NEEDLE) ×2 IMPLANT
PACK ARTHROSCOPY DSU (CUSTOM PROCEDURE TRAY) ×2 IMPLANT
PAD ABD 8X10 STRL (GAUZE/BANDAGES/DRESSINGS) ×2 IMPLANT
PAD ARMBOARD 7.5X6 YLW CONV (MISCELLANEOUS) ×4 IMPLANT
PADDING CAST COTTON 6X4 STRL (CAST SUPPLIES) ×2 IMPLANT
SUT ETHILON 4 0 PS 2 18 (SUTURE) ×2 IMPLANT
SWAB COLLECTION DEVICE MRSA (MISCELLANEOUS) ×2 IMPLANT
SWAB CULTURE ESWAB REG 1ML (MISCELLANEOUS) ×2 IMPLANT
SYR 20CC LL (SYRINGE) ×2 IMPLANT
TOWEL OR 17X24 6PK STRL BLUE (TOWEL DISPOSABLE) ×4 IMPLANT
TUBE CONNECTING 12X1/4 (SUCTIONS) ×2 IMPLANT
TUBING ARTHROSCOPY IRRIG 16FT (MISCELLANEOUS) ×2 IMPLANT
WAND HAND CNTRL MULTIVAC 90 (MISCELLANEOUS) IMPLANT
WATER STERILE IRR 1000ML POUR (IV SOLUTION) ×2 IMPLANT

## 2018-05-06 NOTE — Consult Note (Signed)
     Error [facility]

## 2018-05-06 NOTE — Anesthesia Procedure Notes (Signed)
Procedure Name: LMA Insertion Date/Time: 05/06/2018 9:08 AM Performed by: Moshe Salisbury, CRNA Pre-anesthesia Checklist: Patient identified, Emergency Drugs available, Suction available and Patient being monitored Patient Re-evaluated:Patient Re-evaluated prior to induction Oxygen Delivery Method: Circle System Utilized Preoxygenation: Pre-oxygenation with 100% oxygen Induction Type: IV induction Ventilation: Mask ventilation without difficulty LMA: LMA inserted LMA Size: 4.0 Number of attempts: 1 Placement Confirmation: positive ETCO2 Tube secured with: Tape Dental Injury: Teeth and Oropharynx as per pre-operative assessment

## 2018-05-06 NOTE — Transfer of Care (Signed)
Immediate Anesthesia Transfer of Care Note  Patient: Destiny Day  Procedure(s) Performed: RIGHT KNEE ARTHROSCOPY (Right Knee)  Patient Location: PACU  Anesthesia Type:General  Level of Consciousness: drowsy and patient cooperative  Airway & Oxygen Therapy: Patient Spontanous Breathing and Patient connected to nasal cannula oxygen  Post-op Assessment: Report given to RN, Post -op Vital signs reviewed and stable and Patient moving all extremities  Post vital signs: Reviewed and stable  Last Vitals:  Vitals Value Taken Time  BP 118/54 05/06/2018 10:09 AM  Temp 36.5 C 05/06/2018 10:09 AM  Pulse 78 05/06/2018 10:11 AM  Resp 16 05/06/2018 10:11 AM  SpO2 100 % 05/06/2018 10:11 AM  Vitals shown include unvalidated device data.  Last Pain:  Vitals:   05/06/18 0739  PainSc: 9       Patients Stated Pain Goal: 5 (92/90/90 3014)  Complications: No apparent anesthesia complications

## 2018-05-06 NOTE — Consult Note (Addendum)
[facility] Renal Consultation Note  Indication for Consultation:  Management of ESRD/hemodialysis; anemia, hypertension/volume and secondary hyperparathyroidism  HPI: Destiny Day is a 67 y.o. female with ESRD 2/2 htm /dm  Hd since 09/2015 (Chronic HD MWF compliant ) HO of HTN, DM, GIB, CHF, DJD, PVD( bilat trans met amps),SDH Jan/Feb 2019  requiring craniotomy .  Now with Septic right Knee ,this am =Dr Sharol Given  Right knee Arthroscopy Surgery with Partial medial and lateral meniscectomy, Abrasion chondroplasty medial femoral condyle medial tibial plateau lateral femoral condyle lateral tibial plateau.Removal of loose body 10 mm in diameter and Effusion sent for cultures.    Patient reports pain and swelling of Right knee increasing since 05/01/18, denies sob, cp, n,v,d . Denies problems at op HD      Past Medical History:  Diagnosis Date  . Abdominal bruit   . Anemia   . Anxiety   . Arthritis    Osteoarthritis  . Asthma   . Cervical disc disease    "pinced nerve"  . CHF (congestive heart failure) ([place])   . Chronic kidney disease    dialysis - M/W/F  . Complication of anesthesia    " after I got home from my last procedure, I started itching."  . Diabetes mellitus    Type II  . Diverticulitis   . ESRD on peritoneal dialysis [facility])    Hemodialysis - MWF- [place]   . GERD (gastroesophageal reflux disease)    from medications  . GI bleed 03/31/2013  . History of hiatal hernia   . Hyperlipidemia   . Hypertension   . Neuropathy    left leg  . Osteoporosis   . Peripheral vascular disease ([place])   . Pneumonia    "very young" and a few years ago  . Seasonal allergies   . Shortness of breath dyspnea    WIth exertion  . Sleep apnea    can't afford cpap    Past Surgical History:  Procedure Laterality Date  . A/V SHUNTOGRAM N/A 09/22/2016   Procedure: A/V Shuntogram - left arm;  Surgeon: Serafina Mitchell, MD;  Location: [place] [facility];  Service:  Cardiovascular;  Laterality: N/A;  . A/V SHUNTOGRAM N/A 03/22/2018   Procedure: A/V SHUNTOGRAM - left arm;  Surgeon: Serafina Mitchell, MD;  Location: [place] [facility];  Service: Cardiovascular;  Laterality: N/A;  . ABDOMINAL HYSTERECTOMY  1993`  . AMPUTATION Left 09/01/2016   Procedure: LEFT FOOT TRANSMETATARSAL AMPUTATION;  Surgeon: Newt Minion, MD;  Location: [place];  Service: Orthopedics;  Laterality: Left;  . AMPUTATION Right 08/11/2017   Procedure: RIGHT GREAT TOE AMPUTATION DIGIT;  Surgeon: Rosetta Posner, MD;  Location: [place];  Service: Vascular;  Laterality: Right;  . AMPUTATION Right 12/31/2017   Procedure: RIGHT TRANSMETATARSAL AMPUTATION;  Surgeon: Newt Minion, MD;  Location: [place];  Service: Orthopedics;  Laterality: Right;  . AV FISTULA PLACEMENT Left 04/21/2016   Procedure: INSERTION OF ARTERIOVENOUS (AV) GORE-TEX GRAFT ARM LEFT;  Surgeon: Elam Dutch, MD;  Location: [place];  Service: Vascular;  Laterality: Left;  . [place] TRANSPOSITION Left 07/10/2014   Procedure: BASCILIC VEIN TRANSPOSITION;  Surgeon: Angelia Mould, MD;  Location: [place];  Service: Vascular;  Laterality: Left;  . BASCILIC VEIN TRANSPOSITION Right 11/08/2014   Procedure: FIRST STAGE BASILIC VEIN TRANSPOSITION;  Surgeon: Angelia Mould, MD;  Location: [place];  Service: Vascular;  Laterality: Right;  . BASCILIC VEIN TRANSPOSITION Right 01/18/2015  Procedure: SECOND STAGE BASILIC VEIN TRANSPOSITION;  Surgeon: Angelia Mould, MD;  Location: [place];  Service: Vascular;  Laterality: Right;  . CRANIOTOMY N/A 08/23/2017   Procedure: CRANIOTOMY HEMATOMA EVACUATION SUBDURAL;  Surgeon: Ashok Pall, MD;  Location: [place];  Service: Neurosurgery;  Laterality: N/A;  . CRANIOTOMY Left 08/24/2017   Procedure: CRANIOTOMY FOR RECURRENT ACUTE SUBDURAL HEMATOMA;  Surgeon: Ashok Pall, MD;  Location: [place];  Service: Neurosurgery;  Laterality: Left;  . ESOPHAGOGASTRODUODENOSCOPY N/A 03/31/2013   Procedure:  ESOPHAGOGASTRODUODENOSCOPY (EGD);  Surgeon: Gatha Mayer, MD;  Location: [facility] ENDOSCOPY;  Service: Endoscopy;  Laterality: N/A;  . EYE SURGERY     laser surgery  . FEMORAL-POPLITEAL BYPASS GRAFT Left 07/08/2016   Procedure: LEFT  FEMORAL-BELOW KNEE POPLITEAL ARTERY BYPASS GRAFT USING 6MM X 80 CM PROPATEN GORETEX GRAFT WITH RINGS.;  Surgeon: Rosetta Posner, MD;  Location: [place];  Service: Vascular;  Laterality: Left;  . FEMORAL-POPLITEAL BYPASS GRAFT Right 08/11/2017   Procedure: RIGHT FEMORAL TO BELOW KNEE POPLITEAL ARTERKY  BYPASS GRAFT USING 6MM RINGED PROPATEN GRAFT;  Surgeon: Rosetta Posner, MD;  Location: [place];  Service: Vascular;  Laterality: Right;  . FISTULOGRAM Left 10/29/2014   Procedure: FISTULOGRAM;  Surgeon: Angelia Mould, MD;  Location: [facility] CATH LAB;  Service: Cardiovascular;  Laterality: Left;  . LIGATION OF ARTERIOVENOUS  FISTULA Left 04/21/2016   Procedure: LIGATION OF ARTERIOVENOUS  FISTULA LEFT ARM;  Surgeon: Elam Dutch, MD;  Location: [place];  Service: Vascular;  Laterality: Left;  . LOWER EXTREMITY ANGIOGRAPHY N/A 04/06/2017   Procedure: Lower Extremity Angiography - Right;  Surgeon: Serafina Mitchell, MD;  Location: [place] [facility];  Service: Cardiovascular;  Laterality: N/A;  . PATCH ANGIOPLASTY Right 01/18/2015   Procedure: BASILIC VEIN PATCH ANGIOPLASTY USING VASCUGUARD PATCH;  Surgeon: Angelia Mould, MD;  Location: [place];  Service: Vascular;  Laterality: Right;  . PERIPHERAL VASCULAR BALLOON ANGIOPLASTY  09/22/2016   Procedure: Peripheral Vascular Balloon Angioplasty;  Surgeon: Serafina Mitchell, MD;  Location: [place] [facility];  Service: Cardiovascular;;  Lt. Fistula  . PERIPHERAL VASCULAR BALLOON ANGIOPLASTY Left 03/22/2018   Procedure: PERIPHERAL VASCULAR BALLOON ANGIOPLASTY;  Surgeon: Serafina Mitchell, MD;  Location: [place] [facility];  Service: Cardiovascular;  Laterality: Left;  Arm shunt  . PERIPHERAL VASCULAR CATHETERIZATION N/A 06/23/2016    Procedure: Abdominal Aortogram w/Lower Extremity;  Surgeon: Serafina Mitchell, MD;  Location: [place] [facility];  Service: Cardiovascular;  Laterality: N/A;  . PERIPHERAL VASCULAR CATHETERIZATION  06/23/2016   Procedure: Peripheral Vascular Intervention;  Surgeon: Serafina Mitchell, MD;  Location: [place] [facility];  Service: Cardiovascular;;  lt common and external illiac artery      Family History  Problem Relation Age of Onset  . Other Mother        not sure of cause of death  . Diabetes Father   . Pancreatic cancer Maternal Grandmother   . Colon cancer Neg Hx       reports that she quit smoking about 5 years ago. Her smoking use included cigarettes. She has a 14.00 pack-year smoking history. She has never used smokeless tobacco. She reports that she does not drink alcohol or use drugs.   Allergies  Allergen Reactions  . Prednisone Swelling and Other (See Comments)    Excessive fluid buildup  . Lisinopril Cough    Prior to Admission medications   Medication Sig Start Date End Date Taking? Authorizing Provider  albuterol (PROVENTIL  HFA;VENTOLIN HFA) 108 (90 Base) MCG/ACT inhaler Inhale 1-2 puffs into the lungs every 6 (six) hours as needed for wheezing or shortness of breath. 01/23/18  Yes Allie Bossier, MD  Amino Acids-Protein Hydrolys (FEEDING SUPPLEMENT, PRO-STAT SUGAR FREE 64,) LIQD Take 30 mLs by mouth 2 (two) times daily. 09/16/17  Yes Love, Ivan Anchors, PA-C  aspirin EC 81 MG tablet Take 81 mg by mouth daily.   Yes [provider]  atorvastatin (LIPITOR) 20 MG tablet Take 20 mg by mouth at bedtime.    Yes [provider]  budesonide-formoterol (SYMBICORT) 160-4.5 MCG/ACT inhaler Inhale 1 puff into the lungs daily as needed (for respiratory issues).    Yes [provider]  carvedilol (COREG) 12.5 MG tablet Take 12.5 mg by mouth 2 (two) times daily with a meal.   Yes [provider]  cinacalcet (SENSIPAR) 60 MG tablet Take 60 mg by mouth daily.  Pt. Gets at dialysis 01/06/18  Yes [provider]  clopidogrel (PLAVIX) 75 MG tablet Take 75 mg by mouth daily.   Yes [provider]  diazepam (VALIUM) 5 MG tablet Take 5 mg by mouth 2 (two) times daily as needed for anxiety.   Yes [provider]  fluticasone (FLONASE) 50 MCG/ACT nasal spray Place 1 spray into both nostrils daily as needed for allergies or rhinitis.   Yes [provider]  gabapentin (NEURONTIN) 100 MG capsule TAKE 1 CAPSULE (100 MG TOTAL) BY MOUTH AT BEDTIME. WHEN NECESSARY FOR NEUROPATHY PAIN 01/11/18  Yes Newt Minion, MD  hydrocerin (EUCERIN) CREA Apply 696 application topically 2 (two) times daily. Patient taking differently: Apply 1 application topically 2 (two) times daily.  09/16/17  Yes Love, Ivan Anchors, PA-C  insulin NPH Human (HUMULIN N,NOVOLIN N) 100 UNIT/ML injection Inject 0.1 mLs (10 Units total) into the skin at bedtime. 01/23/18  Yes Allie Bossier, MD  insulin regular (NOVOLIN R,HUMULIN R) 100 units/mL injection Inject 3-8 Units into the skin 3 (three) times daily before meals. Per sliding scale on non-dialysis days   Yes [provider]  isosorbide-hydrALAZINE (BIDIL) 20-37.5 MG tablet Take by mouth 3 (three) times daily.   Yes [provider]  lidocaine-prilocaine (EMLA) cream Apply 1 application topically 3 (three) times a week. 1-2 hours prior to dialysis 01/07/18  Yes [provider]  loratadine (CLARITIN) 10 MG tablet Take 10 mg by mouth daily as needed for allergies.   Yes [provider]  multivitamin (RENA-VIT) TABS tablet Take 1 tablet by mouth daily.  01/24/15  Yes [provider]  omeprazole (PRILOSEC) 20 MG capsule Take 20 mg by mouth daily.    Yes [provider]  oxyCODONE-acetaminophen (PERCOCET/ROXICET) 5-325 MG tablet Take 1 tablet by mouth every 4 (four) hours as needed for severe pain. 12/31/17  Yes Newt Minion, MD  polyethylene glycol [facility] / GLYCOLAX)  packet Take 17 g by mouth daily. Patient taking differently: Take 17 g by mouth daily as needed for moderate constipation.  09/17/17  Yes Love, Ivan Anchors, PA-C  Dakins (SODIUM HYPOCHLORITE) external solution Apply topically daily. Cleanse groin and cover with damp gauze soaked in dakins. 09/16/17   Love, Ivan Anchors, PA-C  LEVEMIR FLEXTOUCH 100 UNIT/ML Pen Inject 7 Units into the skin at bedtime. 09/16/17   Love, Ivan Anchors, PA-C  lidocaine, PF, (XYLOCAINE) 1 % SOLN injection Inject 5 mLs into the skin as needed (topical anesthesia for hemodialysis ifGEBAUERS is ineffective.). 01/23/18   Allie Bossier, MD  Anti-infectives (From admission, onward)   Start     Dose/Rate Route Frequency Ordered Stop   05/06/18 1330  vancomycin (VANCOCIN) 1,500 mg in sodium chloride 0.9 % 500 mL IVPB     1,500 mg 250 mL/hr over 120 Minutes Intravenous To Hemodialysis 05/06/18 1231 05/07/18 1330   05/06/18 1230  piperacillin-tazobactam (ZOSYN) IVPB 3.375 g     3.375 g 12.5 mL/hr over 240 Minutes Intravenous Every 12 hours 05/06/18 1132     05/06/18 1215  vancomycin (VANCOCIN) 1,500 mg in sodium chloride 0.9 % 500 mL IVPB  Status:  Discontinued     1,500 mg 250 mL/hr over 120 Minutes Intravenous  Once 05/06/18 1132 05/06/18 1231      Results for orders placed or performed during the hospital encounter of 05/06/18 (from the past 48 hour(s))  Glucose, capillary     Status: Abnormal   Collection Time: 05/06/18  7:20 AM  Result Value Ref Range   Glucose-Capillary 126 (H) 70 - 99 mg/dL   Comment 1 Notify RN   I-STAT 4, (NA,K, GLUC, HGB,HCT)     Status: Abnormal   Collection Time: 05/06/18  8:24 AM  Result Value Ref Range   Sodium 128 (L) 135 - 145 mmol/L   Potassium 4.4 3.5 - 5.1 mmol/L   Glucose, Bld 150 (H) 70 - 99 mg/dL   HCT 33.0 (L) 36.0 - 46.0 %   Hemoglobin 11.2 (L) 12.0 - 15.0 g/dL  Hemoglobin A1c     Status: Abnormal   Collection Time: 05/06/18  8:33 AM  Result Value Ref Range   Hgb A1c MFr Bld 7.2  (H) 4.8 - 5.6 %    Comment: (NOTE) Pre diabetes:          5.7%-6.4% Diabetes:              >6.4% Glycemic control for   <7.0% adults with diabetes    Mean Plasma Glucose 159.94 mg/dL    Comment: Performed at [facility], [address], [place], [place] [address]  Glucose, capillary     Status: Abnormal   Collection Time: 05/06/18  8:43 AM  Result Value Ref Range   Glucose-Capillary 125 (H) 70 - 99 mg/dL  Aerobic/Anaerobic Culture (surgical/deep wound)     Status: None (Preliminary result)   Collection Time: 05/06/18  9:18 AM  Result Value Ref Range   Specimen Description KNEE RIGTH ABSCESS PUS    Special Requests NONE    Gram Stain      MODERATE WBC PRESENT, PREDOMINANTLY PMN NO ORGANISMS SEEN Performed at [facility], [place] [address] [place], [place] [address]    Culture PENDING    Report Status PENDING   Glucose, capillary     Status: Abnormal   Collection Time: 05/06/18 10:11 AM  Result Value Ref Range   Glucose-Capillary 126 (H) 70 - 99 mg/dL   Comment 1 Notify RN    Comment 2 Document in Chart   Glucose, capillary     Status: Abnormal   Collection Time: 05/06/18 12:21 PM  Result Value Ref Range   Glucose-Capillary 140 (H) 70 - 99 mg/dL    ROS: see hpi  Physical Exam: Vitals:   05/06/18 1054 05/06/18 1112  BP: 134/60 (!) 147/63  Pulse: 75 76  Resp: 13 16  Temp: (!) 97.2 F (36.2 C) 97.7 F (36.5 C)  SpO2: 100% 100%     General: alert thin  Female NAD, Chronically ill  HEENT: [place],  EOMI ,  MM dry  Neck: no jvd   Heart: RRR, no m,r,g Lungs: CTA ,non labored breathing  Abdomen: soft  NTND Extremities: Right knee surg dressing dry clean, no pedal edema, bilat    Skin: no overt rash, no pedal ulcers  Neuro: alert ,OX3, ,no acute focal deficits appreciated  Dialysis Access: pos bruit  LUA  AVG  Dialysis Orders: Center: [facility]  on MWF. EDW 61 kg HD  Bath 3k, Ca 2.25   Time 4hr  Heparin 2500 bolus, 1,000 units mid tx.  Access LUA AV Synthetic Graft      Hec 3 mcg IV/HD  Mircera  NONE  Last  HGB 11.3     Assessment/Plan 1. ESRD -  HD MWF, Today use no hep sp surgery /and HO SDH 2. R/O Right Knee Infection with Degenerative tear Medal Meniscus, Old complex Tear Lateral Meniscus sp Arthroscopy surgery- Plan per Dr Sharol Given and ID seeing   3. Hypertension/volume  - volume ok /op hd Coreg 12.5  Bid . (at [facility] center Amlodipine 10 mg hs and [place]. 25 mg bid) 4. Anemia  - HGB 9.5 ,no ESA at center will start Aranesp 40 mcg q week today  5. Metabolic bone disease -  Hectorol on HD, Sensipar 60 mg   Q day, no Calcium aetate  listed as Phos binder op unit  6. HO  PAD sp bilat Transmet amp 7. HO SDH  Jan 2019 SP Craniotomy - stable ,no hep hd  8. DM type 2 - per admit  9. COPD-(remote tobacco) on inhalers   Ernest Haber, PA-C [place] [phone] 05/06/2018, 1:01 PM   Pt seen, examined and agree w A/P as above.  Kelly Splinter MD [employer] pager [phone]   05/06/2018, 3:57 PM

## 2018-05-06 NOTE — Procedures (Signed)
   I was present at this dialysis session, have reviewed the session itself and made  appropriate changes Kelly Splinter MD [place] pager [phone]   05/06/2018, 3:58 PM

## 2018-05-06 NOTE — Progress Notes (Signed)
Pharmacy Antibiotic Note  Destiny Day is a 67 y.o. female with ESRD admitted on 05/06/2018 with right knee pain now s/p meniscectomy and I&D of right knee.  Pharmacy has been consulted for vancomycin and Zosyn dosing. Last outpatient HD was Wed and had full session. HD orders in for this afternoon.   Plan: Vancomycin 1500 mg IV today after HD then 750 mg IV after each HD, goal pre-HD level 15-25 mcg/ml Zosyn 3.375 g IV q12h (infused over 4 hrs) Monitor clinical progress and C/S F/U HD schedule  Height: 5\' 5"  (165.1 cm) Weight: 146 lb (66.2 kg) IBW/kg (Calculated) : 57  Temp (24hrs), Avg:97.6 F (36.4 C), Min:97.2 F (36.2 C), Max:97.7 F (36.5 C)  No results for input(s): WBC, CREATININE, LATICACIDVEN, VANCOTROUGH, VANCOPEAK, VANCORANDOM, GENTTROUGH, GENTPEAK, GENTRANDOM, TOBRATROUGH, TOBRAPEAK, TOBRARND, AMIKACINPEAK, AMIKACINTROU, AMIKACIN in the last 168 hours.  CrCl cannot be calculated (Patient's most recent lab result is older than the maximum 21 days allowed.).    Allergies  Allergen Reactions  . Prednisone Swelling and Other (See Comments)    Excessive fluid buildup  . Lisinopril Cough    Antimicrobials this admission: vancomycin 10/11 >>  Zosyn 10/11 >>   Dose adjustments this admission:   Microbiology results: 10/11 right knee abscess: pending  Thank you for allowing pharmacy to be a part of this patient's care.  Renold Genta, PharmD, BCPS Clinical Pharmacist Clinical phone for 05/06/2018 until 3p is [phone] Please check AMION for all Pharmacist numbers by unit 05/06/2018 11:28 AM

## 2018-05-06 NOTE — Anesthesia Preprocedure Evaluation (Signed)
Anesthesia Evaluation  Patient identified by MRN, date of birth, ID band Patient awake    Reviewed: Allergy & Precautions, NPO status , Patient's Chart, lab work & pertinent test results, reviewed documented beta blocker date and time   Airway Mallampati: II  TM Distance: >3 FB Neck ROM: Full    Dental no notable dental hx. (+) Edentulous Upper, Dental Advisory Given   Pulmonary asthma , former smoker,    Pulmonary exam normal breath sounds clear to auscultation       Cardiovascular hypertension, Pt. on medications + Peripheral Vascular Disease and +CHF  Normal cardiovascular exam Rhythm:Regular Rate:Normal  Left ventricle: The cavity size was normal. Wall thickness was   increased in a pattern of severe LVH. Systolic function was   normal. The estimated ejection fraction was in the range of 55%   to 60%. Wall motion was normal; there were no regional wall   motion abnormalities. Features are consistent with a pseudonormal   left ventricular filling pattern, with concomitant abnormal   relaxation and increased filling pressure (grade 2 diastolic   dysfunction). Doppler parameters are consistent with high   ventricular filling pressure. - Mitral valve: Calcified annulus. - Left atrium: The atrium was mildly dilated. - Pulmonary arteries: Systolic pressure was mildly increased. PA peak pressure: 44 mm Hg (S).   Neuro/Psych    GI/Hepatic GERD  ,  Endo/Other  diabetes, Type 2, Insulin Dependent  Renal/GU      Musculoskeletal   Abdominal   Peds  Hematology   Anesthesia Other Findings   Reproductive/Obstetrics                            Anesthesia Physical Anesthesia Plan  ASA: IV  Anesthesia Plan: General   Post-op Pain Management:    Induction: Intravenous  PONV Risk Score and Plan: 3 and Ondansetron and Treatment may vary due to age or medical condition  Airway Management Planned:  LMA  Additional Equipment:   Intra-op Plan:   Post-operative Plan: Extubation in OR  Informed Consent: I have reviewed the patients History and Physical, chart, labs and discussed the procedure including the risks, benefits and alternatives for the proposed anesthesia with the patient or authorized representative who has indicated his/her understanding and acceptance.   Dental advisory given  Plan Discussed with: Surgeon and CRNA  Anesthesia Plan Comments:         Anesthesia Quick Evaluation

## 2018-05-06 NOTE — H&P (Addendum)
Destiny Day is an 67 y.o. female.   Chief Complaint: Persistent pain and swelling right knee. HPI: Patient is a 67 year old female with a previous right transmetatarsal amputation who presents with new acute onset of right knee pain.  She reports abrupt onset of the pain over the weekend and she presented to the emergency room where the knee was aspirated and she was given a Kenalog injection.  She returns today saying the knee pain is even worse and she feels like there is more fluid.  She has end-stage renal disease and is on hemodialysis Monday Wednesday Friday at the [facility] kidney center.  Past Medical History:  Diagnosis Date  . Abdominal bruit   . Anemia   . Anxiety   . Arthritis    Osteoarthritis  . Asthma   . Cervical disc disease    "pinced nerve"  . CHF (congestive heart failure) ([place])   . Chronic kidney disease    dialysis - M/W/F  . Complication of anesthesia    " after I got home from my last procedure, I started itching."  . Diabetes mellitus    Type II  . Diverticulitis   . ESRD on peritoneal dialysis [facility])    Hemodialysis - MWF- [place]   . GERD (gastroesophageal reflux disease)    from medications  . GI bleed 03/31/2013  . History of hiatal hernia   . Hyperlipidemia   . Hypertension   . Neuropathy    left leg  . Osteoporosis   . Peripheral vascular disease ([place])   . Pneumonia    "very young" and a few years ago  . Seasonal allergies   . Shortness of breath dyspnea    WIth exertion  . Sleep apnea    can't afford cpap    Past Surgical History:  Procedure Laterality Date  . A/V SHUNTOGRAM N/A 09/22/2016   Procedure: A/V Shuntogram - left arm;  Surgeon: Serafina Mitchell, MD;  Location: [place] CV LAB;  Service: Cardiovascular;  Laterality: N/A;  . A/V SHUNTOGRAM N/A 03/22/2018   Procedure: A/V SHUNTOGRAM - left arm;  Surgeon: Serafina Mitchell, MD;  Location: [place] CV LAB;  Service: Cardiovascular;  Laterality: N/A;  . ABDOMINAL  HYSTERECTOMY  1993`  . AMPUTATION Left 09/01/2016   Procedure: LEFT FOOT TRANSMETATARSAL AMPUTATION;  Surgeon: Newt Minion, MD;  Location: [place];  Service: Orthopedics;  Laterality: Left;  . AMPUTATION Right 08/11/2017   Procedure: RIGHT GREAT TOE AMPUTATION DIGIT;  Surgeon: Rosetta Posner, MD;  Location: [place];  Service: Vascular;  Laterality: Right;  . AMPUTATION Right 12/31/2017   Procedure: RIGHT TRANSMETATARSAL AMPUTATION;  Surgeon: Newt Minion, MD;  Location: [place];  Service: Orthopedics;  Laterality: Right;  . AV FISTULA PLACEMENT Left 04/21/2016   Procedure: INSERTION OF ARTERIOVENOUS (AV) GORE-TEX GRAFT ARM LEFT;  Surgeon: Elam Dutch, MD;  Location: [place];  Service: Vascular;  Laterality: Left;  . [place] TRANSPOSITION Left 07/10/2014   Procedure: BASCILIC VEIN TRANSPOSITION;  Surgeon: Angelia Mould, MD;  Location: [place];  Service: Vascular;  Laterality: Left;  . BASCILIC VEIN TRANSPOSITION Right 11/08/2014   Procedure: FIRST STAGE BASILIC VEIN TRANSPOSITION;  Surgeon: Angelia Mould, MD;  Location: [place];  Service: Vascular;  Laterality: Right;  . BASCILIC VEIN TRANSPOSITION Right 01/18/2015   Procedure: SECOND STAGE BASILIC VEIN TRANSPOSITION;  Surgeon: Angelia Mould, MD;  Location: [place];  Service: Vascular;  Laterality: Right;  . CRANIOTOMY N/A 08/23/2017  Procedure: CRANIOTOMY HEMATOMA EVACUATION SUBDURAL;  Surgeon: Ashok Pall, MD;  Location: [place];  Service: Neurosurgery;  Laterality: N/A;  . CRANIOTOMY Left 08/24/2017   Procedure: CRANIOTOMY FOR RECURRENT ACUTE SUBDURAL HEMATOMA;  Surgeon: Ashok Pall, MD;  Location: [place];  Service: Neurosurgery;  Laterality: Left;  . ESOPHAGOGASTRODUODENOSCOPY N/A 03/31/2013   Procedure: ESOPHAGOGASTRODUODENOSCOPY (EGD);  Surgeon: Gatha Mayer, MD;  Location: [facility] ENDOSCOPY;  Service: Endoscopy;  Laterality: N/A;  . EYE SURGERY     laser surgery  . FEMORAL-POPLITEAL BYPASS GRAFT Left 07/08/2016   Procedure:  LEFT  FEMORAL-BELOW KNEE POPLITEAL ARTERY BYPASS GRAFT USING 6MM X 80 CM PROPATEN GORETEX GRAFT WITH RINGS.;  Surgeon: Rosetta Posner, MD;  Location: [place];  Service: Vascular;  Laterality: Left;  . FEMORAL-POPLITEAL BYPASS GRAFT Right 08/11/2017   Procedure: RIGHT FEMORAL TO BELOW KNEE POPLITEAL ARTERKY  BYPASS GRAFT USING 6MM RINGED PROPATEN GRAFT;  Surgeon: Rosetta Posner, MD;  Location: [place];  Service: Vascular;  Laterality: Right;  . FISTULOGRAM Left 10/29/2014   Procedure: FISTULOGRAM;  Surgeon: Angelia Mould, MD;  Location: [facility] CATH LAB;  Service: Cardiovascular;  Laterality: Left;  . LIGATION OF ARTERIOVENOUS  FISTULA Left 04/21/2016   Procedure: LIGATION OF ARTERIOVENOUS  FISTULA LEFT ARM;  Surgeon: Elam Dutch, MD;  Location: [place];  Service: Vascular;  Laterality: Left;  . LOWER EXTREMITY ANGIOGRAPHY N/A 04/06/2017   Procedure: Lower Extremity Angiography - Right;  Surgeon: Serafina Mitchell, MD;  Location: [place] CV LAB;  Service: Cardiovascular;  Laterality: N/A;  . PATCH ANGIOPLASTY Right 01/18/2015   Procedure: BASILIC VEIN PATCH ANGIOPLASTY USING VASCUGUARD PATCH;  Surgeon: Angelia Mould, MD;  Location: [place];  Service: Vascular;  Laterality: Right;  . PERIPHERAL VASCULAR BALLOON ANGIOPLASTY  09/22/2016   Procedure: Peripheral Vascular Balloon Angioplasty;  Surgeon: Serafina Mitchell, MD;  Location: [place] CV LAB;  Service: Cardiovascular;;  Lt. Fistula  . PERIPHERAL VASCULAR BALLOON ANGIOPLASTY Left 03/22/2018   Procedure: PERIPHERAL VASCULAR BALLOON ANGIOPLASTY;  Surgeon: Serafina Mitchell, MD;  Location: [place] CV LAB;  Service: Cardiovascular;  Laterality: Left;  Arm shunt  . PERIPHERAL VASCULAR CATHETERIZATION N/A 06/23/2016   Procedure: Abdominal Aortogram w/Lower Extremity;  Surgeon: Serafina Mitchell, MD;  Location: [place] CV LAB;  Service: Cardiovascular;  Laterality: N/A;  . PERIPHERAL VASCULAR CATHETERIZATION  06/23/2016   Procedure: Peripheral  Vascular Intervention;  Surgeon: Serafina Mitchell, MD;  Location: [place] CV LAB;  Service: Cardiovascular;;  lt common and external illiac artery    Family History  Problem Relation Age of Onset  . Other Mother        not sure of cause of death  . Diabetes Father   . Pancreatic cancer Maternal Grandmother   . Colon cancer Neg Hx    Social History:  reports that she quit smoking about 5 years ago. Her smoking use included cigarettes. She has a 14.00 pack-year smoking history. She has never used smokeless tobacco. She reports that she does not drink alcohol or use drugs.  Allergies:  Allergies  Allergen Reactions  . Prednisone Swelling and Other (See Comments)    Excessive fluid buildup  . Lisinopril Cough    No medications prior to admission.    No results found for this or any previous visit (from the past 48 hour(s)). Xr Knee 1-2 Views Right  Result Date: 05/05/2018 Radiographs of the right knee show tricompartmental degenerative changes with loose osteophyte in the posterior knee as  well as osteophytes over the patella.  Calcified vessels noted as well.  No evidence for fracture no evidence for lytic lesions.  Radiographs were reviewed with Dr. Sharol Given.   Review of Systems  All other systems reviewed and are negative.   There were no vitals taken for this visit. Physical Exam examination patient has a effusion of the right knee.  In the office after informed consent and sterile prepping the knee was aspirated from the superior lateral portal.  30 cc of cloudy synovial fluid was aspirated this was sent for cultures.  This is concerning for a septic joint.  Assessment/Plan 1. Pyogenic arthritis of right knee joint, due to unspecified organism ([place])   2. Acute pain of right knee   3. Type 2 diabetes mellitus with diabetic nephropathy, with long-term current use of insulin ([place])   4. End-stage renal disease on hemodialysis [facility])     Plan: We will plan for arthroscopic  irrigation and debridement of the right knee.  Risks and benefits were discussed including persistent infection need for additional surgery.  Patient states she understands wished to proceed at this time.  Will plan for admission and initiation of IV antibiotics pending results of cultures obtained yesterday.   Elmyra Ricks at the [facility] kidney center was contacted concerning the patient's need for hemodialysis while hospitalized and she is going to notify the nephrologist at [facility] of the patient's impending surgery and need for hemodialysis while hospitalized.  She will follow-up next week postoperatively.   Newt Minion, MD 05/06/2018, 6:50 AM

## 2018-05-06 NOTE — Progress Notes (Signed)
Pharmacy Antibiotic Note  Destiny Day is a 67 y.o. female with ESRD admitted on 05/06/2018 with right knee pain now s/p meniscectomy and I&D of right knee. Pt is getting HD this PM. She has been seen by ID today. Zosyn as been changed to ceftazidime. We will dose it after the HD session today. Looks like she will be on a MWF schedule  Plan: Vancomycin 1500 mg IV today after HD then 750 mg IV after each HD, goal pre-HD level 15-25 mcg/ml Ceftazidime 2g IV x1 then qHD F/U HD schedule  Height: 5\' 5"  (165.1 cm) Weight: 138 lb 3.7 oz (62.7 kg) IBW/kg (Calculated) : 57  Temp (24hrs), Avg:97.6 F (36.4 C), Min:97.2 F (36.2 C), Max:97.7 F (36.5 C)  Recent Labs  Lab 05/06/18 1511  WBC 10.9*  CREATININE 5.62*    Estimated Creatinine Clearance: 8.7 mL/min (A) (by C-G formula based on SCr of 5.62 mg/dL (H)).    Allergies  Allergen Reactions  . Prednisone Swelling and Other (See Comments)    Excessive fluid buildup  . Lisinopril Cough    Antimicrobials this admission: vancomycin 10/11 >>  ceftzidime 10/11 >>   Dose adjustments this admission:   Microbiology results: 10/11 right knee abscess: pending  Onnie Boer, PharmD, BCIDP, AAHIVP, CPP Infectious Disease Pharmacist Pager: [phone] 05/06/2018 6:03 PM

## 2018-05-06 NOTE — Progress Notes (Signed)
Patient ID: Destiny Day, female   DOB: [birth year], 67 y.o.   MRN: [id number]  Cloudy joint fluid aspirated and sent for cultures both yesterday (from the office) and today (from the OR), will have patient maintained as inpatient until antibiotic coverage determined.

## 2018-05-06 NOTE — Op Note (Signed)
05/06/2018  10:06 AM  PATIENT:  Destiny Day    PRE-OPERATIVE DIAGNOSIS:  Septic Right Knee  POST-OPERATIVE DIAGNOSIS: Medial meniscal tear lateral meniscal tear, loose body 10 mm in diameter with osteochondral defects of the lateral tibial plateau medial tibial plateau patella trochlea medial and lateral femoral condyles.  Rule out sepsis.  PROCEDURE:  RIGHT KNEE ARTHROSCOPY Partial medial and lateral meniscectomy. Abrasion chondroplasty medial femoral condyle medial tibial plateau lateral femoral condyle lateral tibial plateau. Removal of loose body 10 mm in diameter. Effusion sent for cultures.  SURGEON:  Newt Minion, MD  PHYSICIAN ASSISTANT:None ANESTHESIA:   General  PREOPERATIVE INDICATIONS:  Destiny Day is a  67 y.o. female with a diagnosis of Septic Right Knee who failed conservative measures and elected for surgical management.    The risks benefits and alternatives were discussed with the patient preoperatively including but not limited to the risks of infection, bleeding, nerve injury, cardiopulmonary complications, the need for revision surgery, among others, and the patient was willing to proceed.  OPERATIVE IMPLANTS: None  @ENCIMAGES @  OPERATIVE FINDINGS: Tricompartmental osteoarthritis with loose body and cloudy effusion possible septic joint.  OPERATIVE PROCEDURE: Patient was brought the operating room and underwent a general anesthetic.  After adequate levels of anesthesia were obtained patient's right lower extremity was prepped using DuraPrep draped into a sterile field a timeout was called.  The scope was inserted to the anterior lateral portal and anterior medial working portal was established.  Visualization showed significant amount of synovitis.  The fluid from the joint was sent for cultures.  This was cloudy.  Using the shaver patient underwent a tricompartmental synovectomy.  Patient had a large loose body in the lateral gutter and the  instruments were changed a grabber was inserted through the lateral portal and the lateral portal was extended and the loose body was removed from the lateral portal.  This was 10 mm in diameter.  Visualization of the medial joint line with valgus stress showed degenerative changes of the medial meniscus and patient underwent debridement of the shaver and the biter.  Examination of the notch showed intact ACL.  Examination lateral joint line in the figure-of-four position showed large extensive degenerative changes of the lateral meniscus.  Using a biter and the shaver this was debrided back to healthy viable margin.  There is a large osteochondral defect in the lateral tibial plateau and patient underwent abrasion chondroplasty about the lateral femoral condyle lateral tibial plateau patient also underwent abrasion chondroplasty of the medial tibial plateau and medial femoral condyle.  Examination of the patellofemoral joint with the knee extended show arthritic changes as well as osteophytic bone spurs and significant synovitis as well as a plica.  This was all debrided.  A electrical wand was used for hemostasis.  A total of 15 liters was irrigated through the joint.  Patient received Kefzol after initial cultures were obtained.  The portals were loosely closed with 3-0 nylon a sterile dressing was applied patient was extubated taken to PACU in stable condition.   DISCHARGE PLANNING:  Antibiotic duration: Continue IV antibiotics until cultures are finalized  Weightbearing: Weightbearing as tolerated  Pain medication: Opioid pathway ordered  Dressing care/ Wound VAC: Compression dressing  Ambulatory devices: Walker  Discharge to: Anticipate discharge to home  Follow-up: In the office 1 week post operative.

## 2018-05-06 NOTE — Consult Note (Signed)
[facility]iny Day for Infectious Disease    Date of Admission:  05/06/2018   Total days of antibiotics: 0 vanco/zosyn               Reason for Consult: Septic arthritis    Referring Provider: Sharol Given   Assessment: Septic arthritis ESRD DM2  Plan: 1. Stop zosyn 2. Add ceftaz 3. Await Cx 4. Watch FSG  Thank you so much for this interesting consult,  Active Problems:   Degenerative tear of posterior horn of medial meniscus, right   Old complex tear of lateral meniscus of right knee   Loose body in knee, right knee   Septic arthritis of knee, right ([name])   . aspirin EC  81 mg Oral Daily  . atorvastatin  20 mg Oral QHS  . carvedilol  12.5 mg Oral BID WC  . cinacalcet  60 mg Oral Q supper  . clopidogrel  75 mg Oral Daily  . darbepoetin (ARANESP) injection - DIALYSIS  40 mcg Intravenous Q Fri-HD  . docusate sodium  100 mg Oral BID  . [START ON 05/09/2018] doxercalciferol  3 mcg Intravenous Q M,W,F-HD  . gabapentin  100 mg Oral QHS  . insulin aspart  0-9 Units Subcutaneous TID WC  . insulin aspart  3 Units Subcutaneous TID WC  . insulin glargine  10 Units Subcutaneous QHS  . isosorbide-hydrALAZINE  1 tablet Oral TID  . mometasone-formoterol  2 puff Inhalation BID  . multivitamin  1 tablet Oral Daily  . [START ON 05/07/2018] pantoprazole  40 mg Oral Daily    HPI: Destiny Day is a 67 y.o. female with hx of ESRD, previous R transmet, adm on 10-10 with new R knee pain. She was prior seen with knee pain in ED 10-7 and had joint aspirate and steroid injection.  She continued to have knee pain and came to ED on 10-10. She was seen by ortho and underwent R knee arthroscopy, meniscectomy today. G/s no organisms, mod WBC.  She was started on vanco/zosyn.    Review of Systems: Review of Systems  Constitutional: Negative for chills and fever.  Respiratory: Negative for shortness of breath.   Gastrointestinal: Positive for constipation.  Genitourinary: Negative for  dysuria.  Musculoskeletal: Positive for joint pain.  Please see HPI. All other systems reviewed and negative.   Past Medical History:  Diagnosis Date  . Abdominal bruit   . Anemia   . Anxiety   . Arthritis    Osteoarthritis  . Asthma   . Cervical disc disease    "pinced nerve"  . CHF (congestive heart failure) ([place])   . Chronic kidney disease    dialysis - M/W/F  . Complication of anesthesia    " after I got home from my last procedure, I started itching."  . Diabetes mellitus    Type II  . Diverticulitis   . ESRD on peritoneal dialysis [facility])    Hemodialysis - MWF- [place]   . GERD (gastroesophageal reflux disease)    from medications  . GI bleed 03/31/2013  . History of hiatal hernia   . Hyperlipidemia   . Hypertension   . Neuropathy    left leg  . Osteoporosis   . Peripheral vascular disease ([place])   . Pneumonia    "very young" and a few years ago  . Seasonal allergies   . Shortness of breath dyspnea    WIth exertion  . Sleep apnea  can't afford cpap    Social History   Tobacco Use  . Smoking status: Former Smoker    Packs/day: 0.35    Years: 40.00    Pack years: 14.00    Types: Cigarettes    Last attempt to quit: 05/10/2012    Years since quitting: 5.9  . Smokeless tobacco: Never Used  Substance Use Topics  . Alcohol use: No  . Drug use: No    Comment: marijuana; quit in early 1980's    Family History  Problem Relation Age of Onset  . Other Mother        not sure of cause of death  . Diabetes Father   . Pancreatic cancer Maternal Grandmother   . Colon cancer Neg Hx      Medications:  Scheduled: . aspirin EC  81 mg Oral Daily  . atorvastatin  20 mg Oral QHS  . carvedilol  12.5 mg Oral BID WC  . cinacalcet  60 mg Oral Q supper  . clopidogrel  75 mg Oral Daily  . darbepoetin (ARANESP) injection - DIALYSIS  40 mcg Intravenous Q Fri-HD  . docusate sodium  100 mg Oral BID  . [START ON 05/09/2018] doxercalciferol  3 mcg Intravenous Q M,W,F-HD   . gabapentin  100 mg Oral QHS  . insulin aspart  0-9 Units Subcutaneous TID WC  . insulin aspart  3 Units Subcutaneous TID WC  . insulin glargine  10 Units Subcutaneous QHS  . isosorbide-hydrALAZINE  1 tablet Oral TID  . mometasone-formoterol  2 puff Inhalation BID  . multivitamin  1 tablet Oral Daily  . [START ON 05/07/2018] pantoprazole  40 mg Oral Daily    Abtx:  Anti-infectives (From admission, onward)   Start     Dose/Rate Route Frequency Ordered Stop   05/06/18 1330  vancomycin (VANCOCIN) 1,500 mg in sodium chloride 0.9 % 500 mL IVPB     1,500 mg 250 mL/hr over 120 Minutes Intravenous To Hemodialysis 05/06/18 1231 05/07/18 1330   05/06/18 1230  piperacillin-tazobactam (ZOSYN) IVPB 3.375 g     3.375 g 12.5 mL/hr over 240 Minutes Intravenous Every 12 hours 05/06/18 1132     05/06/18 1215  vancomycin (VANCOCIN) 1,500 mg in sodium chloride 0.9 % 500 mL IVPB  Status:  Discontinued     1,500 mg 250 mL/hr over 120 Minutes Intravenous  Once 05/06/18 1132 05/06/18 1839        OBJECTIVE: Blood pressure (!) 165/71, pulse 83, temperature 97.7 F (36.5 C), temperature source Oral, resp. rate 16, height '5\' 5"'  (1.651 m), weight 62.7 kg, SpO2 100 %.  Physical Exam  Constitutional: She is oriented to person, place, and time. She appears well-developed and well-nourished.  Eyes: Pupils are equal, round, and reactive to light.  Neck: Normal range of motion. Neck supple.  Cardiovascular: Normal rate, regular rhythm and normal heart sounds.  Pulmonary/Chest: Effort normal and breath sounds normal.  Abdominal: Soft. Bowel sounds are normal. She exhibits no distension. There is no tenderness.  Musculoskeletal:       Arms:      Legs: Lymphadenopathy:    She has no cervical adenopathy.  Neurological: She is alert and oriented to person, place, and time.    Lab Results Results for orders placed or performed during the hospital encounter of 05/06/18 (from the past 48 hour(s))  Glucose,  capillary     Status: Abnormal   Collection Time: 05/06/18  7:20 AM  Result Value Ref Range   Glucose-Capillary 126 (  H) 70 - 99 mg/dL   Comment 1 Notify RN   I-STAT 4, (NA,K, GLUC, HGB,HCT)     Status: Abnormal   Collection Time: 05/06/18  8:24 AM  Result Value Ref Range   Sodium 128 (L) 135 - 145 mmol/L   Potassium 4.4 3.5 - 5.1 mmol/L   Glucose, Bld 150 (H) 70 - 99 mg/dL   HCT 33.0 (L) 36.0 - 46.0 %   Hemoglobin 11.2 (L) 12.0 - 15.0 g/dL  Hemoglobin A1c     Status: Abnormal   Collection Time: 05/06/18  8:33 AM  Result Value Ref Range   Hgb A1c MFr Bld 7.2 (H) 4.8 - 5.6 %    Comment: (NOTE) Pre diabetes:          5.7%-6.4% Diabetes:              >6.4% Glycemic control for   <7.0% adults with diabetes    Mean Plasma Glucose 159.94 mg/dL    Comment: Performed at [facility], [place] [address] [place], [place] [address]  Glucose, capillary     Status: Abnormal   Collection Time: 05/06/18  8:43 AM  Result Value Ref Range   Glucose-Capillary 125 (H) 70 - 99 mg/dL  Aerobic/Anaerobic Culture (surgical/deep wound)     Status: None (Preliminary result)   Collection Time: 05/06/18  9:18 AM  Result Value Ref Range   Specimen Description KNEE RIGTH ABSCESS PUS    Special Requests NONE    Gram Stain      MODERATE WBC PRESENT, PREDOMINANTLY PMN NO ORGANISMS SEEN Performed at [facility], [place] [address], [place], [place] [address]    Culture PENDING    Report Status PENDING   Glucose, capillary     Status: Abnormal   Collection Time: 05/06/18 10:11 AM  Result Value Ref Range   Glucose-Capillary 126 (H) 70 - 99 mg/dL   Comment 1 Notify RN    Comment 2 Document in Chart   Glucose, capillary     Status: Abnormal   Collection Time: 05/06/18 12:21 PM  Result Value Ref Range   Glucose-Capillary 140 (H) 70 - 99 mg/dL  Renal function panel     Status: Abnormal   Collection Time: 05/06/18  3:11 PM  Result Value Ref Range   Sodium 130 (L) 135 - 145 mmol/L   Potassium 4.3  3.5 - 5.1 mmol/L   Chloride 92 (L) 98 - 111 mmol/L   CO2 24 22 - 32 mmol/L   Glucose, Bld 121 (H) 70 - 99 mg/dL   BUN 41 (H) 8 - 23 mg/dL   Creatinine, Ser 5.62 (H) 0.44 - 1.00 mg/dL   Calcium 8.8 (L) 8.9 - 10.3 mg/dL   Phosphorus 7.5 (H) 2.5 - 4.6 mg/dL   Albumin 2.6 (L) 3.5 - 5.0 g/dL   GFR calc non Af Amer 7 (L) >60 mL/min   GFR calc Af Amer 8 (L) >60 mL/min    Comment: (NOTE) The eGFR has been calculated using the CKD EPI equation. This calculation has not been validated in all clinical situations. eGFR's persistently <60 mL/min signify possible Chronic Kidney Disease.    Anion gap 14 5 - 15    Comment: Performed at [facility] [address] [place], [place] [address]  CBC     Status: Abnormal   Collection Time: 05/06/18  3:11 PM  Result Value Ref Range   WBC 10.9 (H) 4.0 - 10.5 K/uL   RBC 3.17 (  L) 3.87 - 5.11 MIL/uL   Hemoglobin 9.5 (L) 12.0 - 15.0 g/dL   HCT 29.7 (L) 36.0 - 46.0 %   MCV 93.7 80.0 - 100.0 fL   MCH 30.0 26.0 - 34.0 pg   MCHC 32.0 30.0 - 36.0 g/dL   RDW 13.8 11.5 - 15.5 %   Platelets 305 150 - 400 K/uL   nRBC 0.0 0.0 - 0.2 %    Comment: Performed at [facility] [address], [place], [place] [address]      Component Value Date/Time   SDES KNEE RIGTH ABSCESS PUS 05/06/2018 0918   SPECREQUEST NONE 05/06/2018 0918   CULT PENDING 05/06/2018 0918   REPTSTATUS PENDING 05/06/2018 0918   Xr Knee 1-2 Views Right  Result Date: 05/05/2018 Radiographs of the right knee show tricompartmental degenerative changes with loose osteophyte in the posterior knee as well as osteophytes over the patella.  Calcified vessels noted as well.  No evidence for fracture no evidence for lytic lesions.  Radiographs were reviewed with Dr. Sharol Given.  Recent Results (from the past 240 hour(s))  Aerobic/Anaerobic Culture (surgical/deep wound)     Status: None (Preliminary result)   Collection Time: 05/06/18  9:18 AM  Result Value Ref Range Status   Specimen  Description KNEE RIGTH ABSCESS PUS  Final   Special Requests NONE  Final   Gram Stain   Final    MODERATE WBC PRESENT, PREDOMINANTLY PMN NO ORGANISMS SEEN Performed at [facility], [address], [place], [place] [address]    Culture PENDING  Incomplete   Report Status PENDING  Incomplete    Microbiology: Recent Results (from the past 240 hour(s))  Aerobic/Anaerobic Culture (surgical/deep wound)     Status: None (Preliminary result)   Collection Time: 05/06/18  9:18 AM  Result Value Ref Range Status   Specimen Description KNEE RIGTH ABSCESS PUS  Final   Special Requests NONE  Final   Gram Stain   Final    MODERATE WBC PRESENT, PREDOMINANTLY PMN NO ORGANISMS SEEN Performed at [facility], [address], [place], [place] [address]    Culture PENDING  Incomplete   Report Status PENDING  Incomplete    Radiographs and labs were personally reviewed by me.   Bobby Rumpf, MD [facility] for [facility] [place] [facility] [phone] 05/06/2018, 5:28 PM

## 2018-05-06 NOTE — Anesthesia Postprocedure Evaluation (Signed)
Anesthesia Post Note  Patient: Destiny Day  Procedure(s) Performed: RIGHT KNEE ARTHROSCOPY (Right Knee)     Patient location during evaluation: PACU Anesthesia Type: General Level of consciousness: awake and alert Pain management: pain level controlled Vital Signs Assessment: post-procedure vital signs reviewed and stable Respiratory status: spontaneous breathing, nonlabored ventilation and respiratory function stable Cardiovascular status: blood pressure returned to baseline and stable Postop Assessment: no apparent nausea or vomiting Anesthetic complications: no    Last Vitals:  Vitals:   05/06/18 1024 05/06/18 1039  BP: (!) 131/57 117/61  Pulse: 74 73  Resp: 14 13  Temp:    SpO2: 100% 100%    Last Pain:  Vitals:   05/06/18 1040  PainSc: 0-No pain        RLE Motor Response: Responds to commands (05/06/18 1040)        Brennan Bailey

## 2018-05-07 ENCOUNTER — Encounter (HOSPITAL_COMMUNITY): Payer: Self-pay | Admitting: Orthopedic Surgery

## 2018-05-07 ENCOUNTER — Inpatient Hospital Stay (HOSPITAL_COMMUNITY): Payer: Medicare Other

## 2018-05-07 DIAGNOSIS — M2341 Loose body in knee, right knee: Secondary | ICD-10-CM

## 2018-05-07 DIAGNOSIS — G9341 Metabolic encephalopathy: Secondary | ICD-10-CM

## 2018-05-07 LAB — CBC WITH DIFFERENTIAL/PLATELET
Abs Immature Granulocytes: 0.12 10*3/uL — ABNORMAL HIGH (ref 0.00–0.07)
Basophils Absolute: 0.1 10*3/uL (ref 0.0–0.1)
Basophils Relative: 0 %
Eosinophils Absolute: 0.2 10*3/uL (ref 0.0–0.5)
Eosinophils Relative: 1 %
HCT: 30.1 % — ABNORMAL LOW (ref 36.0–46.0)
Hemoglobin: 9.2 g/dL — ABNORMAL LOW (ref 12.0–15.0)
Immature Granulocytes: 1 %
Lymphocytes Relative: 12 %
Lymphs Abs: 1.6 10*3/uL (ref 0.7–4.0)
MCH: 29.5 pg (ref 26.0–34.0)
MCHC: 30.6 g/dL (ref 30.0–36.0)
MCV: 96.5 fL (ref 80.0–100.0)
Monocytes Absolute: 1.2 10*3/uL — ABNORMAL HIGH (ref 0.1–1.0)
Monocytes Relative: 9 %
Neutro Abs: 9.6 10*3/uL — ABNORMAL HIGH (ref 1.7–7.7)
Neutrophils Relative %: 77 %
Platelets: 283 10*3/uL (ref 150–400)
RBC: 3.12 MIL/uL — ABNORMAL LOW (ref 3.87–5.11)
RDW: 14.4 % (ref 11.5–15.5)
WBC: 12.7 10*3/uL — ABNORMAL HIGH (ref 4.0–10.5)
nRBC: 0 % (ref 0.0–0.2)

## 2018-05-07 LAB — COMPREHENSIVE METABOLIC PANEL
ALT: 14 U/L (ref 0–44)
AST: 23 U/L (ref 15–41)
Albumin: 2.4 g/dL — ABNORMAL LOW (ref 3.5–5.0)
Alkaline Phosphatase: 134 U/L — ABNORMAL HIGH (ref 38–126)
Anion gap: 13 (ref 5–15)
BUN: 22 mg/dL (ref 8–23)
CO2: 23 mmol/L (ref 22–32)
Calcium: 8 mg/dL — ABNORMAL LOW (ref 8.9–10.3)
Chloride: 96 mmol/L — ABNORMAL LOW (ref 98–111)
Creatinine, Ser: 4.33 mg/dL — ABNORMAL HIGH (ref 0.44–1.00)
GFR calc Af Amer: 11 mL/min — ABNORMAL LOW (ref >60)
GFR calc non Af Amer: 10 mL/min — ABNORMAL LOW (ref >60)
Glucose, Bld: 171 mg/dL — ABNORMAL HIGH (ref 70–99)
Potassium: 4.5 mmol/L (ref 3.5–5.1)
Sodium: 132 mmol/L — ABNORMAL LOW (ref 135–145)
Total Bilirubin: 0.5 mg/dL (ref 0.3–1.2)
Total Protein: 6.5 g/dL (ref 6.5–8.1)

## 2018-05-07 LAB — GLUCOSE, CAPILLARY
Glucose-Capillary: 108 mg/dL — ABNORMAL HIGH (ref 70–99)
Glucose-Capillary: 177 mg/dL — ABNORMAL HIGH (ref 70–99)
Glucose-Capillary: 145 mg/dL — ABNORMAL HIGH (ref 70–99)
Glucose-Capillary: 186 mg/dL — ABNORMAL HIGH (ref 70–99)
Glucose-Capillary: 132 mg/dL — ABNORMAL HIGH (ref 70–99)

## 2018-05-07 LAB — BLOOD GAS, ARTERIAL
Acid-Base Excess: 2 mmol/L (ref 0.0–2.0)
Bicarbonate: 26.9 mmol/L (ref 20.0–28.0)
Drawn by: 27407
O2 Content: 3 L/min
O2 Saturation: 97.5 %
Patient temperature: 98.6
pCO2 arterial: 48.2 mmHg — ABNORMAL HIGH (ref 32.0–48.0)
pH, Arterial: 7.365 (ref 7.350–7.450)
pO2, Arterial: 105 mmHg (ref 83.0–108.0)

## 2018-05-07 LAB — AMMONIA: Ammonia: 25 umol/L (ref 9–35)

## 2018-05-07 LAB — MAGNESIUM: Magnesium: 1.9 mg/dL (ref 1.7–2.4)

## 2018-05-07 MED ORDER — FERRIC CITRATE 1 GM 210 MG(FE) PO TABS
420.0000 mg | ORAL_TABLET | Freq: Three times a day (TID) | ORAL | Status: DC
  Administered 2018-05-07 – 2018-05-10 (×7): 420 mg via ORAL
  Filled 2018-05-07 (×11): qty 2

## 2018-05-07 MED ORDER — ACETAMINOPHEN-CODEINE #3 300-30 MG PO TABS: 1.0000 | ORAL_TABLET | Freq: Four times a day (QID) | ORAL | Status: DC | PRN

## 2018-05-07 NOTE — Consult Note (Signed)
Medical Consultation   Destiny Day  LZJ:[id number]  DOB: Jan 05, 1951  DOA: 05/06/2018  PCP: Velna Hatchet, MD   Outpatient Specialists:   Requesting physician: Orthopedic surgeon, Dr. Marlou Sa.   Reason for consultation: Decreased level of mentation.   History of Present Illness: Destiny Day is an 67 y.o. female, with past medical history significant for OSA but noncompliant with CPAP, PVD, hypertension, hyperlipidemia, GI bleed, ESRD on hemodialysis on Monday, Wednesday and Friday; diabetes mellitus, grade 2 diastolic dysfunction with elevated PA peak pressure (44 mmHg).  The patient underwent right knee arthroscopy yesterday for right septic knee.  Patient is currently on IV antibiotics (IV vancomycin and ceftazidime), and was given some pain medications, opiates.  Patient is also reported to have fever.  The orthopedic team noted decreased level of mentation this morning, and the hospitalist team has been called to assist with patient's management.  Patient's opiates are currently on hold.  Patient was seen alongside patient's nurse.  Patient's nurse tells me that patient's mentation is improving.  Patient is able to communicate to me, though, remains sleepy.  Fever is reported, as already documented above.  No headache, and any pain, no chest pain, no shortness of breath, no GI symptoms.  Last hemodialysis was yesterday.  Opiates are currently on hold.  No new labs for today.  However, lab work done yesterday none revealing.  Review of Systems:  ROS As per HPI otherwise 10 point review of systems negative.    Past Medical History: Past Medical History:  Diagnosis Date  . Abdominal bruit   . Anemia   . Anxiety   . Arthritis    Osteoarthritis  . Asthma   . Cervical disc disease    "pinced nerve"  . CHF (congestive heart failure) ([place])   . Chronic kidney disease    dialysis - M/W/F  . Complication of anesthesia    " after I got home from my last  procedure, I started itching."  . Diabetes mellitus    Type II  . Diverticulitis   . ESRD on peritoneal dialysis [facility])    Hemodialysis - MWF- [place]   . GERD (gastroesophageal reflux disease)    from medications  . GI bleed 03/31/2013  . History of hiatal hernia   . Hyperlipidemia   . Hypertension   . Neuropathy    left leg  . Osteoporosis   . Peripheral vascular disease ([place])   . Pneumonia    "very young" and a few years ago  . Seasonal allergies   . Shortness of breath dyspnea    WIth exertion  . Sleep apnea    can't afford cpap    Past Surgical History: Past Surgical History:  Procedure Laterality Date  . A/V SHUNTOGRAM N/A 09/22/2016   Procedure: A/V Shuntogram - left arm;  Surgeon: Serafina Mitchell, MD;  Location: [place] [facility];  Service: Cardiovascular;  Laterality: N/A;  . A/V SHUNTOGRAM N/A 03/22/2018   Procedure: A/V SHUNTOGRAM - left arm;  Surgeon: Serafina Mitchell, MD;  Location: [place] [facility];  Service: Cardiovascular;  Laterality: N/A;  . ABDOMINAL HYSTERECTOMY  1993`  . AMPUTATION Left 09/01/2016   Procedure: LEFT FOOT TRANSMETATARSAL AMPUTATION;  Surgeon: Newt Minion, MD;  Location: [place];  Service: Orthopedics;  Laterality: Left;  . AMPUTATION Right 08/11/2017   Procedure: RIGHT GREAT TOE AMPUTATION DIGIT;  Surgeon: Rosetta Posner, MD;  Location: [facility];  Service: Vascular;  Laterality: Right;  . AMPUTATION Right 12/31/2017   Procedure: RIGHT TRANSMETATARSAL AMPUTATION;  Surgeon: Newt Minion, MD;  Location: [place];  Service: Orthopedics;  Laterality: Right;  . AV FISTULA PLACEMENT Left 04/21/2016   Procedure: INSERTION OF ARTERIOVENOUS (AV) GORE-TEX GRAFT ARM LEFT;  Surgeon: Elam Dutch, MD;  Location: [place];  Service: Vascular;  Laterality: Left;  . [place] TRANSPOSITION Left 07/10/2014   Procedure: BASCILIC VEIN TRANSPOSITION;  Surgeon: Angelia Mould, MD;  Location: [place];  Service: Vascular;  Laterality: Left;  . BASCILIC VEIN  TRANSPOSITION Right 11/08/2014   Procedure: FIRST STAGE BASILIC VEIN TRANSPOSITION;  Surgeon: Angelia Mould, MD;  Location: [place];  Service: Vascular;  Laterality: Right;  . BASCILIC VEIN TRANSPOSITION Right 01/18/2015   Procedure: SECOND STAGE BASILIC VEIN TRANSPOSITION;  Surgeon: Angelia Mould, MD;  Location: [place];  Service: Vascular;  Laterality: Right;  . CRANIOTOMY N/A 08/23/2017   Procedure: CRANIOTOMY HEMATOMA EVACUATION SUBDURAL;  Surgeon: Ashok Pall, MD;  Location: [place];  Service: Neurosurgery;  Laterality: N/A;  . CRANIOTOMY Left 08/24/2017   Procedure: CRANIOTOMY FOR RECURRENT ACUTE SUBDURAL HEMATOMA;  Surgeon: Ashok Pall, MD;  Location: [place];  Service: Neurosurgery;  Laterality: Left;  . ESOPHAGOGASTRODUODENOSCOPY N/A 03/31/2013   Procedure: ESOPHAGOGASTRODUODENOSCOPY (EGD);  Surgeon: Gatha Mayer, MD;  Location: [facility] ENDOSCOPY;  Service: Endoscopy;  Laterality: N/A;  . EYE SURGERY     laser surgery  . FEMORAL-POPLITEAL BYPASS GRAFT Left 07/08/2016   Procedure: LEFT  FEMORAL-BELOW KNEE POPLITEAL ARTERY BYPASS GRAFT USING 6MM X 80 CM PROPATEN GORETEX GRAFT WITH RINGS.;  Surgeon: Rosetta Posner, MD;  Location: [place];  Service: Vascular;  Laterality: Left;  . FEMORAL-POPLITEAL BYPASS GRAFT Right 08/11/2017   Procedure: RIGHT FEMORAL TO BELOW KNEE POPLITEAL ARTERKY  BYPASS GRAFT USING 6MM RINGED PROPATEN GRAFT;  Surgeon: Rosetta Posner, MD;  Location: [place];  Service: Vascular;  Laterality: Right;  . FISTULOGRAM Left 10/29/2014   Procedure: FISTULOGRAM;  Surgeon: Angelia Mould, MD;  Location: [facility] CATH LAB;  Service: Cardiovascular;  Laterality: Left;  . KNEE ARTHROSCOPY Right 05/06/2018   Procedure: RIGHT KNEE ARTHROSCOPY;  Surgeon: Newt Minion, MD;  Location: [place];  Service: Orthopedics;  Laterality: Right;  . LIGATION OF ARTERIOVENOUS  FISTULA Left 04/21/2016   Procedure: LIGATION OF ARTERIOVENOUS  FISTULA LEFT ARM;  Surgeon: Elam Dutch, MD;  Location: [place];  Service: Vascular;  Laterality: Left;  . LOWER EXTREMITY ANGIOGRAPHY N/A 04/06/2017   Procedure: Lower Extremity Angiography - Right;  Surgeon: Serafina Mitchell, MD;  Location: [place] [facility];  Service: Cardiovascular;  Laterality: N/A;  . PATCH ANGIOPLASTY Right 01/18/2015   Procedure: BASILIC VEIN PATCH ANGIOPLASTY USING VASCUGUARD PATCH;  Surgeon: Angelia Mould, MD;  Location: [place];  Service: Vascular;  Laterality: Right;  . PERIPHERAL VASCULAR BALLOON ANGIOPLASTY  09/22/2016   Procedure: Peripheral Vascular Balloon Angioplasty;  Surgeon: Serafina Mitchell, MD;  Location: [place] [facility];  Service: Cardiovascular;;  Lt. Fistula  . PERIPHERAL VASCULAR BALLOON ANGIOPLASTY Left 03/22/2018   Procedure: PERIPHERAL VASCULAR BALLOON ANGIOPLASTY;  Surgeon: Serafina Mitchell, MD;  Location: [place] [facility];  Service: Cardiovascular;  Laterality: Left;  Arm shunt  . PERIPHERAL VASCULAR CATHETERIZATION N/A 06/23/2016   Procedure: Abdominal Aortogram w/Lower Extremity;  Surgeon: Serafina Mitchell, MD;  Location: [place] [facility];  Service: Cardiovascular;  Laterality: N/A;  . PERIPHERAL VASCULAR CATHETERIZATION  06/23/2016   Procedure: Peripheral Vascular Intervention;  Surgeon: Serafina Mitchell, MD;  Location: [place] [facility];  Service: Cardiovascular;;  lt common and external illiac artery     Allergies:   Allergies  Allergen Reactions  . Prednisone Swelling and Other (See Comments)    Excessive fluid buildup  . Lisinopril Cough     Social History:  reports that she quit smoking about 5 years ago. Her smoking use included cigarettes. She has a 14.00 pack-year smoking history. She has never used smokeless tobacco. She reports that she does not drink alcohol or use drugs.   Family History: Family History  Problem Relation Age of Onset  . Other Mother        not sure of cause of death  . Diabetes Father   . Pancreatic cancer Maternal Grandmother   . Colon cancer Neg Hx        Physical Exam: Vitals:   05/06/18 1844 05/06/18 2126 05/06/18 2201 05/07/18 0849  BP: (!) 199/80  (!) 143/71 (!) 118/42  Pulse: 93  94 91  Resp: 16  17 16   Temp: 98.5 F (36.9 C)  99.4 F (37.4 C) 99.2 F (37.3 C)  TempSrc: Oral  Oral Oral  SpO2: 100% 100% 100%   Weight:      Height:        Constitutional: Sleepy.   Eyes: Pallor.  No jaundice.    ENMT: external ears and nose appear normal,             Lips appears normal, oropharynx mucosa, tongue, posterior pharynx appear normal  Neck: neck appears normal, no masses, normal ROM, no thyromegaly, no JVD  CVS: S1-S2.  Mild bilateral lower extremity edema is noted.   Respiratory:  clear to auscultation.   Abdomen: soft nontender, nondistended, normal bowel sounds.  Organs are not palpable. Musculoskeletal: Bilateral forefeet amputation.                      Neuro: Sleepy, but easily wakes up to communicate with [place].  Patient moves all extremities.    Data reviewed:  I have personally reviewed following labs and imaging studies Labs:  CBC: Recent Labs  Lab 05/06/18 0824 05/06/18 1511  WBC  --  10.9*  HGB 11.2* 9.5*  HCT 33.0* 29.7*  MCV  --  93.7  PLT  --  563    Basic Metabolic Panel: Recent Labs  Lab 05/06/18 0824 05/06/18 1511  NA 128* 130*  K 4.4 4.3  CL  --  92*  CO2  --  24  GLUCOSE 150* 121*  BUN  --  41*  CREATININE  --  5.62*  CALCIUM  --  8.8*  PHOS  --  7.5*   GFR Estimated Creatinine Clearance: 8.7 mL/min (A) (by C-G formula based on SCr of 5.62 mg/dL (H)). Liver Function Tests: Recent Labs  Lab 05/06/18 1511  ALBUMIN 2.6*   No results for input(s): LIPASE, AMYLASE in the last 168 hours. No results for input(s): AMMONIA in the last 168 hours. Coagulation profile No results for input(s): INR, PROTIME in the last 168 hours.  Cardiac Enzymes: No results for input(s): CKTOTAL, CKMB, CKMBINDEX, TROPONINI in the last 168 hours. BNP: Invalid input(s): POCBNP CBG: Recent Labs  Lab  05/06/18 1221 05/06/18 1822 05/06/18 2159 05/07/18 0651 05/07/18 0855  GLUCAP 140* 94 101* 108* 177*   D-Dimer No results for input(s): DDIMER in the last 72 hours. Hgb A1c Recent Labs  05/06/18 0833  HGBA1C 7.2*   Lipid Profile No results for input(s): CHOL, HDL, LDLCALC, TRIG, CHOLHDL, LDLDIRECT in the last 72 hours. Thyroid function studies No results for input(s): TSH, T4TOTAL, T3FREE, THYROIDAB in the last 72 hours.  Invalid input(s): FREET3 Anemia work up No results for input(s): VITAMINB12, FOLATE, FERRITIN, TIBC, IRON, RETICCTPCT in the last 72 hours. Urinalysis    Component Value Date/Time   COLORURINE YELLOW 07/07/2016 0914   APPEARANCEUR CLOUDY (A) 07/07/2016 0914   LABSPEC >1.030 (H) 07/07/2016 0914   PHURINE 5.5 07/07/2016 0914   GLUCOSEU NEGATIVE 07/07/2016 0914   HGBUR SMALL (A) 07/07/2016 0914   BILIRUBINUR SMALL (A) 07/07/2016 0914   KETONESUR 15 (A) 07/07/2016 0914   PROTEINUR >300 (A) 07/07/2016 0914   UROBILINOGEN 0.2 07/30/2014 2000   NITRITE NEGATIVE 07/07/2016 0914   LEUKOCYTESUR SMALL (A) 07/07/2016 0914     Sepsis Labs Invalid input(s): PROCALCITONIN,  WBC,  LACTICIDVEN Microbiology Recent Results (from the past 240 hour(s))  Anaerobic and Aerobic Culture     Status: None (Preliminary result)   Collection Time: 05/05/18  2:07 PM  Result Value Ref Range Status   MICRO NUMBER: [id number]  Preliminary   SPECIMEN QUALITY: ADEQUATE  Preliminary   Source: NOT GIVEN  Preliminary   STATUS: PRELIMINARY  Preliminary   GRAM STAIN:   Preliminary    Many Polymorphonuclear leukocytes No organisms seen  Aerobic/Anaerobic Culture (surgical/deep wound)     Status: None (Preliminary result)   Collection Time: 05/06/18  9:18 AM  Result Value Ref Range Status   Specimen Description KNEE RIGTH ABSCESS PUS  Final   Special Requests NONE  Final   Gram Stain   Final    MODERATE WBC PRESENT, PREDOMINANTLY PMN NO ORGANISMS SEEN Performed at [facility], [place] [address] [place], [place] [address]    Culture PENDING  Incomplete   Report Status PENDING  Incomplete       Inpatient Medications:   Scheduled Meds: . aspirin EC  81 mg Oral Daily  . atorvastatin  20 mg Oral QHS  . carvedilol  12.5 mg Oral BID WC  . cinacalcet  60 mg Oral Q supper  . clopidogrel  75 mg Oral Daily  . darbepoetin (ARANESP) injection - DIALYSIS  40 mcg Intravenous Q Fri-HD  . docusate sodium  100 mg Oral BID  . [START ON 05/09/2018] doxercalciferol  3 mcg Intravenous Q M,W,F-HD  . gabapentin  100 mg Oral QHS  . insulin aspart  0-9 Units Subcutaneous TID WC  . insulin aspart  3 Units Subcutaneous TID WC  . insulin glargine  10 Units Subcutaneous QHS  . isosorbide-hydrALAZINE  1 tablet Oral TID  . mometasone-formoterol  2 puff Inhalation BID  . multivitamin  1 tablet Oral Daily  . pantoprazole  40 mg Oral Daily  . [START ON 05/09/2018] vancomycin  500 mg Intravenous Q M,W,F-HD   Continuous Infusions: . sodium chloride Stopped (05/06/18 1332)  . [START ON 05/09/2018] cefTAZidime (FORTAZ)  IV    . methocarbamol (ROBAXIN) IV       Radiological Exams on Admission: Xr Knee 1-2 Views Right  Result Date: 05/05/2018 Radiographs of the right knee show tricompartmental degenerative changes with loose osteophyte in the posterior knee as well as osteophytes over the patella.  Calcified vessels noted as well.  No evidence for fracture no evidence for lytic lesions.  Radiographs were reviewed with Dr. Sharol Given.   Impression/Recommendations Decreased level of mentation/altered mentis status (encephalopathy).  ESRD on hemodialysis on Monday, Wednesday and Friday Use of mind altering medications (opiate etc.) Degenerative tear of posterior horn of medial meniscus, right Old complex tear of lateral meniscus of right knee Loose body in knee, right knee Septic arthritis of knee, right (HCC)   Please minimize all mind altering medications. Obtain CT scan  head. ABG Ammonia level CPAP at nighttime Renal panel, magnesium and CBC Blood cultures x2  Continue current antibiotics.  We will continue to assess, follow patient and manage with you.  Thank you for this consultation.  Our [facility] hospitalist team will follow the patient with you.   Time Spent: 65 minutes.  Dana Allan, MD  [facility] Pager #: [phone] 7PM-7AM contact night coverage as above

## 2018-05-07 NOTE — Plan of Care (Signed)
Acute rehab goals established. 

## 2018-05-07 NOTE — Progress Notes (Signed)
0900, patient noted to be drowsy and diaphoretic.  Able to arouse, follows commands and answers simple questions appropriately but would fall back asleep. Patient had pain medicine at 0808, for c/o Rt knee pain. Patient had x1 episode of nausea/vomiting, med for same. VSS, Temp down to 99.2. CBG 177. No SOB or resp distress noted.  Started on o2 @2lpm  via nasal cannula, o2 sat 95-100%. MD in unit rounding. Rapid response called.  1030. Patient noted to be more alert. Able to follow commands and carry on a conversation. Husband at bedside. Patient states "I just want to sleep". Denies SOB or resp distress. Will cont to monitor.

## 2018-05-07 NOTE — Plan of Care (Signed)
  Problem: Clinical Measurements: Goal: Ability to maintain clinical measurements within normal limits will improve Outcome: Progressing Goal: Will remain free from infection Outcome: Progressing Goal: Diagnostic test results will improve Outcome: Progressing Goal: Cardiovascular complication will be avoided Outcome: Progressing   Problem: Activity: Goal: Risk for activity intolerance will decrease Outcome: Progressing   Problem: Nutrition: Goal: Adequate nutrition will be maintained Outcome: Progressing   Problem: Coping: Goal: Level of anxiety will decrease Outcome: Progressing   Problem: Nutrition: Goal: Adequate nutrition will be maintained Outcome: Progressing   Problem: Coping: Goal: Level of anxiety will decrease Outcome: Progressing   Problem: Pain Managment: Goal: General experience of comfort will improve Outcome: Progressing   Problem: Safety: Goal: Ability to remain free from injury will improve Outcome: Progressing   Problem: Skin Integrity: Goal: Risk for impaired skin integrity will decrease Outcome: Progressing

## 2018-05-07 NOTE — Progress Notes (Signed)
Patient vomited x 1 episode large amount of undigested food. VS 133/96, T. 101.1. Voices no complaints of nausea. Notified the attending, MD. Will continue to monitor.

## 2018-05-07 NOTE — Progress Notes (Signed)
INFECTIOUS DISEASE PROGRESS NOTE  ID: Destiny Day is a 67 y.o. female with  Active Problems:   Degenerative tear of posterior horn of medial meniscus, right   Old complex tear of lateral meniscus of right knee   Loose body in knee, right knee   Septic arthritis of knee, right (HCC)  Subjective: C/o knee pain.   Abtx:  Anti-infectives (From admission, onward)   Start     Dose/Rate Route Frequency Ordered Stop   05/09/18 2000  cefTAZidime (FORTAZ) 2 g in sodium chloride 0.9 % 100 mL IVPB     2 g 200 mL/hr over 30 Minutes Intravenous Every M-W-F (Hemodialysis) 05/06/18 1823     05/09/18 1200  vancomycin (VANCOCIN) IVPB 500 mg/100 ml premix     500 mg 100 mL/hr over 60 Minutes Intravenous Every M-W-F (Hemodialysis) 05/06/18 1827     05/06/18 2000  cefTAZidime (FORTAZ) 2 g in sodium chloride 0.9 % 100 mL IVPB     2 g 200 mL/hr over 30 Minutes Intravenous  Once 05/06/18 1834 05/06/18 2219   05/06/18 1330  vancomycin (VANCOCIN) 1,500 mg in sodium chloride 0.9 % 500 mL IVPB     1,500 mg 250 mL/hr over 120 Minutes Intravenous To Hemodialysis 05/06/18 1231 05/06/18 1913   05/06/18 1230  piperacillin-tazobactam (ZOSYN) IVPB 3.375 g  Status:  Discontinued     3.375 g 12.5 mL/hr over 240 Minutes Intravenous Every 12 hours 05/06/18 1132 05/06/18 1746   05/06/18 1215  vancomycin (VANCOCIN) 1,500 mg in sodium chloride 0.9 % 500 mL IVPB  Status:  Discontinued     1,500 mg 250 mL/hr over 120 Minutes Intravenous  Once 05/06/18 1132 05/06/18 1839      Medications:  Scheduled: . aspirin EC  81 mg Oral Daily  . atorvastatin  20 mg Oral QHS  . carvedilol  12.5 mg Oral BID WC  . cinacalcet  60 mg Oral Q supper  . clopidogrel  75 mg Oral Daily  . darbepoetin (ARANESP) injection - DIALYSIS  40 mcg Intravenous Q Fri-HD  . docusate sodium  100 mg Oral BID  . [START ON 05/09/2018] doxercalciferol  3 mcg Intravenous Q M,W,F-HD  . gabapentin  100 mg Oral QHS  . insulin aspart  0-9 Units  Subcutaneous TID WC  . insulin aspart  3 Units Subcutaneous TID WC  . insulin glargine  10 Units Subcutaneous QHS  . isosorbide-hydrALAZINE  1 tablet Oral TID  . mometasone-formoterol  2 puff Inhalation BID  . multivitamin  1 tablet Oral Daily  . pantoprazole  40 mg Oral Daily  . [START ON 05/09/2018] vancomycin  500 mg Intravenous Q M,W,F-HD    Objective: Vital signs in last 24 hours: Temp:  [97.7 F (36.5 C)-99.4 F (37.4 C)] 99.2 F (37.3 C) (10/12 0849) Pulse Rate:  [76-94] 91 (10/12 0849) Resp:  [16-17] 16 (10/12 0849) BP: (118-199)/(42-95) 118/42 (10/12 0849) SpO2:  [100 %] 100 % (10/11 2201) Weight:  [62.7 kg] 62.7 kg (10/11 1400)   General appearance: alert, cooperative and no distress Extremities: R knee dressed. LUE AVG + bruit.   Lab Results Recent Labs    05/06/18 1511 05/07/18 1016  WBC 10.9* 12.7*  HGB 9.5* 9.2*  HCT 29.7* 30.1*  NA 130* 132*  K 4.3 4.5  CL 92* 96*  CO2 24 23  BUN 41* 22  CREATININE 5.62* 4.33*   Liver Panel Recent Labs    05/06/18 1511 05/07/18 1016  PROT  --  6.5  ALBUMIN 2.6* 2.4*  AST  --  23  ALT  --  14  ALKPHOS  --  134*  BILITOT  --  0.5   Sedimentation Rate No results for input(s): ESRSEDRATE in the last 72 hours. C-Reactive Protein No results for input(s): CRP in the last 72 hours.  Microbiology: Recent Results (from the past 240 hour(s))  Anaerobic and Aerobic Culture     Status: None (Preliminary result)   Collection Time: 05/05/18  2:07 PM  Result Value Ref Range Status   MICRO NUMBER: [id number]  Preliminary   SPECIMEN QUALITY: ADEQUATE  Preliminary   Source: NOT GIVEN  Preliminary   STATUS: PRELIMINARY  Preliminary   GRAM STAIN:   Preliminary    Many Polymorphonuclear leukocytes No organisms seen  Aerobic/Anaerobic Culture (surgical/deep wound)     Status: None (Preliminary result)   Collection Time: 05/06/18  9:18 AM  Result Value Ref Range Status   Specimen Description KNEE RIGTH ABSCESS PUS  Final     Special Requests NONE  Final   Gram Stain   Final    MODERATE WBC PRESENT, PREDOMINANTLY PMN NO ORGANISMS SEEN Performed at [facility], [place] [address], [place],  [address]    Culture PENDING  Incomplete   Report Status PENDING  Incomplete    Studies/Results: Ct Head Wo Contrast  Result Date: 05/07/2018 CLINICAL DATA:  Encephalopathy. EXAM: CT HEAD WITHOUT CONTRAST TECHNIQUE: Contiguous axial images were obtained from the base of the skull through the vertex without intravenous contrast. COMPARISON:  Head CT dated 09/28/2017. FINDINGS: Brain: Low-density areas are now present within the bilateral periventricular and subcortical white matter regions, more prominent than on previous studies. There is no parenchymal or extra-axial hemorrhage appreciated on today's exam. The recent LEFT-sided subdural hematoma has resolved. Vascular: Chronic calcified atherosclerotic changes of the large vessels at the skull base. No unexpected hyperdense vessel. Skull: No acute findings. LEFT frontoparietal craniotomy appears well aligned. Sinuses/Orbits: No acute finding. Other: None. IMPRESSION: 1. Low-density within the periventricular white matter regions bilaterally. This may represent typical chronic small vessel ischemic change, however, it is more prominently seen on today's study which may indicate superimposed edema. Given the history of encephalopathy, would consider brain MRI for further characterization. 2. No intracranial hemorrhage. LEFT-sided subdural hematoma has resolved. Electronically Signed   By: Franki Cabot M.D.   On: 05/07/2018 11:47   Xr Knee 1-2 Views Right  Result Date: 05/05/2018 Radiographs of the right knee show tricompartmental degenerative changes with loose osteophyte in the posterior knee as well as osteophytes over the patella.  Calcified vessels noted as well.  No evidence for fracture no evidence for lytic lesions.  Radiographs were reviewed with Dr.  Sharol Given.    Assessment/Plan: ESRD R native knee septic arthritis  Total days of antibiotics: 2 vanco/ceftaz  Cx pending, will continue to watch, awaiting Cx. No change in anbx         Bobby Rumpf MD, FACP Infectious Diseases (pager) [phone] [email or web address] 05/07/2018, 12:00 PM  LOS: 1 day

## 2018-05-07 NOTE — Progress Notes (Addendum)
Subjective:  In room , recent  pain med given for Knee,pleasantly confused , HD yest on HD  On schedule   Objective Vital signs in last 24 hours: Vitals:   05/06/18 2126 05/06/18 2201 05/07/18 0849 05/07/18 1208  BP:  (!) 143/71 (!) 118/42   Pulse:  94 91   Resp:  17 16   Temp:  99.4 F (37.4 C) 99.2 F (37.3 C)   TempSrc:  Oral Oral   SpO2: 100% 100%  98%  Weight:      Height:       Physical Exam: General: alert thin , Chronically ill ,pleasantl Female NAD,    Heart: RRR, no m,r,g Lungs: CTA ,non labored breathing  Abdomen: soft  NTND Extremities: Right knee surg dressing dry clean, bilat no  pedal edema   Dialysis Access: pos bruit  LUA  AVG  OP Dialysis Orders: Center: [facility]  on MWF. EDW 61 kg HD  Bath 3k, Ca 2.25   Time 4hr  Heparin 2500 bolus, 1,000 units mid tx.  Access LUA AV Synthetic Graft     Hec 3 mcg IV/HD  Mircera  NONE  Last  HGB 11.3   Problem/Plan: 1. ESRD -  HD MWF,on schedule / No Hep with SDH  2. R/O Right Knee Infection with Degenerative tear Medal Meniscus, Old complex Tear Lateral Meniscus sp Arthroscopy surgery- Plan per Dr Sharol Given and ID seeing  / ON prn  ROBAXIN and Valium  Will dc Robaxin  ,mild confusion this am ) 3. Hypertension/volume  -Am bp 118/42  op meds Coreg 12.5  Bid . (at [facility] Amlodipine 10 mg hs and Meto. 25 mg bid) now only on Coreg  12.5 mg bid / Uf 1351 cc On hd yest , stable vol by exam . 4. Anemia  - HGB 9.5 >9.2 this am ,no ESA at center, Given Aranesp 40 mcg 05/06/18 hd ( q Friday hd )  5. Metabolic bone disease - corec ca 9.0/ phos 7.5  Hectorol on HD, Sensipar 60 mg  Q day, last OP  Lab note "missing some Auryxia" continue 2ac in mch  6. HO  PAD sp bilat Transmet amp 7. HO SDH  Jan 2019 SP Craniotomy - stable ,no hep hd  8. DM type 2 - per admit  9. 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  Kelly Splinter MD [facility] pager [phone]   05/07/2018, 1:29 PM    Labs: Basic Metabolic Panel: Recent Labs  Lab 05/06/18 0824 05/06/18 1511 05/07/18 1016  NA 128* 130* 132*  K 4.4 4.3 4.5  CL  --  92* 96*  CO2  --  24 23  GLUCOSE 150* 121* 171*  BUN  --  41* 22  CREATININE  --  5.62* 4.33*  CALCIUM  --  8.8* 8.0*  PHOS  --  7.5*  --    Liver Function Tests: Recent Labs  Lab 05/06/18 1511 05/07/18 1016  AST  --  23  ALT  --  14  ALKPHOS  --  134*  BILITOT  --  0.5  PROT  --  6.5  ALBUMIN 2.6* 2.4*   No results for input(s): LIPASE, AMYLASE in the last 168 hours. Recent Labs  Lab 05/07/18 1016  AMMONIA 25   CBC: Recent Labs  Lab 05/06/18 0824 05/06/18 1511 05/07/18 1016  WBC  --  10.9* 12.7*  NEUTROABS  --   --  9.6*  HGB 11.2* 9.5* 9.2*  HCT 33.0* 29.7* 30.1*  MCV  --  93.7 96.5  PLT  --  305 283   Cardiac Enzymes: No results for input(s): CKTOTAL, CKMB, CKMBINDEX, TROPONINI in the last 168 hours. CBG: Recent Labs  Lab 05/06/18 1822 05/06/18 2159 05/07/18 0651 05/07/18 0855 05/07/18 1201  GLUCAP 94 101* 108* 177* 145*    Medications: . sodium chloride Stopped (05/06/18 1332)  . [START ON 05/09/2018] cefTAZidime (FORTAZ)  IV    . methocarbamol (ROBAXIN) IV     . aspirin EC  81 mg Oral Daily  . atorvastatin  20 mg Oral QHS  . carvedilol  12.5 mg Oral BID WC  . cinacalcet  60 mg Oral Q supper  . clopidogrel  75 mg Oral Daily  . darbepoetin (ARANESP) injection - DIALYSIS  40 mcg Intravenous Q Fri-HD  . docusate sodium  100 mg Oral BID  . [START ON 05/09/2018] doxercalciferol  3 mcg Intravenous Q M,W,F-HD  . gabapentin  100 mg Oral QHS  . insulin aspart  0-9 Units Subcutaneous TID WC  . insulin aspart  3 Units Subcutaneous TID WC  . insulin glargine  10 Units Subcutaneous QHS  . isosorbide-hydrALAZINE  1 tablet Oral TID  . mometasone-formoterol  2 puff Inhalation BID  . multivitamin  1 tablet Oral Daily  .  pantoprazole  40 mg Oral Daily  . [START ON 05/09/2018] vancomycin  500 mg Intravenous Q M,W,F-HD

## 2018-05-07 NOTE — Plan of Care (Signed)

## 2018-05-07 NOTE — Progress Notes (Signed)
Subjective: Patient stable.  She does seem very somnolent.  With range of motion of the knee she does wake up.   Objective: Vital signs in last 24 hours: Temp:  [97.2 F (36.2 C)-99.4 F (37.4 C)] 99.2 F (37.3 C) (10/12 0849) Pulse Rate:  [73-94] 91 (10/12 0849) Resp:  [10-17] 16 (10/12 0849) BP: (117-199)/(42-95) 118/42 (10/12 0849) SpO2:  [100 %] 100 % (10/11 2201) Weight:  [62.7 kg] 62.7 kg (10/11 1400)  Intake/Output from previous day: 10/11 0701 - 10/12 0700 In: 210.6 [I.V.:210.6] Out: 1376 [Blood:25] Intake/Output this shift: Total I/O In: 240 [P.O.:240] Out: -   Exam:  Dorsiflexion/Plantar flexion intact  Labs: Recent Labs    05/06/18 0824 05/06/18 1511  HGB 11.2* 9.5*   Recent Labs    05/06/18 0824 05/06/18 1511  WBC  --  10.9*  RBC  --  3.17*  HCT 33.0* 29.7*  PLT  --  305   Recent Labs    05/06/18 0824 05/06/18 1511  NA 128* 130*  K 4.4 4.3  CL  --  92*  CO2  --  24  BUN  --  41*  CREATININE  --  5.62*  GLUCOSE 150* 121*  CALCIUM  --  8.8*   No results for input(s): LABPT, INR in the last 72 hours.  Assessment/Plan: Impression is patient is somnolent status post right knee arthroscopy.  This could be pain medicine related.  She did have some nausea early this morning.  I will obtain medical consult for possible mental status changes as well as hold pain medicine until noon.  Discussed this with the nurse.  Would also like to start her on a CPM machine for that right knee 0 to 20 degrees just to get some motion.   Landry Dyke Leib Elahi 05/07/2018, 9:23 AM

## 2018-05-07 NOTE — Progress Notes (Signed)
PT Cancellation Note  Patient Details Name: Destiny Day MRN: [id number] DOB: September 20, 1950   Cancelled Treatment:    Reason Eval/Treat Not Completed: Patient not medically ready Patient has been vomiting and is not coherent enough to participate with therapy at this time. Husband requests we follow-up later. Will attempt to follow-up this afternoon for comprehensive evaluation.  Ellouise Newer 05/07/2018, 9:23 AM   Elayne Snare, PT, DPT

## 2018-05-07 NOTE — Progress Notes (Signed)
Patient is off the floor to Radiology. 

## 2018-05-07 NOTE — Significant Event (Signed)
Rapid Response Event Note RN called for pt more somnolent Overview:  RN states he gave her prescribed Oxycodone 10 mg at 0800. He felt that she has gradually become harder to arouse.     Initial Focused Assessment: On arrival pt in a semi fowler position in bed, Right knee elevated with ice pack applied, skin warm and dry. Spouse reports she was also given tylenol for fever and was "completely soaked" in sweat. RN changed pt and linens. Pt alert to self, DOB, age, location and date. Awakened with painful stimulation, follow commands but easily drift back off. Pt awake and alert after she vomited large amount of food.   Interventions: DC'd all current pain medications. Verbal order by Dr. Marlou Sa for Tylenol 3 every 6 hours PRN. Will consult with [facility] for assisting with care. Plan of Care (if not transferred): Continue to monitor, hold pain meds for now.  Event Summary:   at      at          [facility], Sela [place]

## 2018-05-07 NOTE — Evaluation (Signed)
Physical Therapy Evaluation Patient Details Name: Destiny Day MRN: [id number] DOB: 09/18/1950 Today's Date: 05/07/2018   History of Present Illness   Destiny Day is a  67 y.o. female with a diagnosis of Septic Right Knee , s/p Partial medial and lateral meniscectomy,  Abrasion chondroplasty medial femoral condyle medial tibial plateau lateral femoral condyle lateral tibial plateau, Removal of loose body 10 mm in diameter. Hx of bil transmetatarsal amputation,  CHF, anxiety, and ESRD dialysis MWF.  Clinical Impression  Patient is s/p above procedure, presenting with functional limitations due to the deficits listed below (see PT Problem List). Poor tolerance with PT this afternoon. Required +2 assist to safely mobilize. Took several steps and able to pivot to chair. She has multiple steps to navigate at home and based on her performance today she would likely benefit from short term SNF. We will continue to work with her however, in hopes of progressing her as quickly and safely as possible. Good family support from husband although he would not be able to provide the level of assistance she required today. Will update recommendations as she progresses. Patient will benefit from skilled PT to increase their independence and safety with mobility to allow discharge to the venue listed below.       Follow Up Recommendations SNF    Equipment Recommendations  None recommended by PT    Recommendations for Other Services OT consult     Precautions / Restrictions Precautions Precautions: Fall Restrictions Weight Bearing Restrictions: Yes RLE Weight Bearing: Weight bearing as tolerated      Mobility  Bed Mobility Overal bed mobility: Needs Assistance Bed Mobility: Supine to Sit     Supine to sit: Max assist     General bed mobility comments: Pt able to initiate movement with LLE and minimally with RLE, required max assist for trunk and RLE support to achieve seated position  at EOB. Use of bed pad to help scoot.  Transfers Overall transfer level: Needs assistance Equipment used: Rolling walker (2 wheeled) Transfers: Sit to/from [facility] Sit to Stand: Mod assist;+2 physical assistance Stand pivot transfers: Mod assist       General transfer comment: +2 mod assist for boost to stand, VC for technique and hand placement, accepting minimal weight through RLE to rise. Max cues for upright posture. Mod assist with pivot to recliner, cues for sequencing, needs guidance with RW.  Ambulation/Gait Ambulation/Gait assistance: Mod assist;+2 physical assistance Gait Distance (Feet): 6 Feet Assistive device: Rolling walker (2 wheeled) Gait Pattern/deviations: Step-to pattern;Decreased step length - right;Decreased step length - left;Decreased stance time - right;Decreased stride length;Trunk flexed Gait velocity: slow Gait velocity interpretation: <1.31 ft/sec, indicative of household ambulator General Gait Details: Tolerated short distance in room with +2 mod assist for sequencing, and weight shift when advancing LLE as she accepted weight through RLE. Max VC for sequencing, upright posture, and safety. Required assist to advance and navigate walker.  Stairs            Wheelchair Mobility    Modified Rankin (Stroke Patients Only)       Balance Overall balance assessment: Needs assistance Sitting-balance support: Single extremity supported;Feet supported Sitting balance-Leahy Scale: Poor     Standing balance support: Bilateral upper extremity supported Standing balance-Leahy Scale: Poor Standing balance comment: Able to stand for short periods unsupported but required UE support on RW.  Pertinent Vitals/Pain Pain Assessment: Faces Faces Pain Scale: Hurts whole lot Pain Location: Rt knee when moved Pain Descriptors / Indicators: Aching;Grimacing;Burning Pain Intervention(s): Monitored during  session;Repositioned    Home Living Family/patient expects to be discharged to:: Private residence Living Arrangements: Spouse/significant other Available Help at Discharge: Family;Available 24 hours/day Type of Home: House Home Access: Stairs to enter Entrance Stairs-Rails: Left Entrance Stairs-Number of Steps: 2 Home Layout: Multi-level;Bed/bath upstairs Home Equipment: Walker - 4 wheels;Cane - single point;Shower seat;Crutches;Walker - 2 wheels Additional Comments: tub bench,, grabbar in shower, SPC, 2 rollators, RW    Prior Function Level of Independence: Independent with assistive device(s)         Comments: Used cane for gait, but occasionally RW.     Hand Dominance   Dominant Hand: Right    Extremity/Trunk Assessment   Upper Extremity Assessment Upper Extremity Assessment: Defer to OT evaluation    Lower Extremity Assessment Lower Extremity Assessment: Generalized weakness(Rt knee bandaged, painful with minimal movement)       Communication   Communication: No difficulties  Cognition Arousal/Alertness: Lethargic;Suspect due to medications Behavior During Therapy: Flat affect Overall Cognitive Status: Impaired/Different from baseline Area of Impairment: Following commands;Problem solving                       Following Commands: Follows one step commands with increased time;Follows multi-step commands inconsistently     Problem Solving: Slow processing;Decreased initiation;Difficulty sequencing;Requires verbal cues        General Comments General comments (skin integrity, edema, etc.): Husband in room, very supportive.    Exercises General Exercises - Lower Extremity Ankle Circles/Pumps: AAROM;Both;10 reps;Seated Quad Sets: AAROM;Right;10 reps;Seated   Assessment/Plan    PT Assessment Patient needs continued PT services  PT Problem List Decreased strength;Decreased range of motion;Decreased activity tolerance;Decreased balance;Decreased  mobility;Decreased cognition;Decreased knowledge of use of DME;Decreased knowledge of precautions;Pain       PT Treatment Interventions DME instruction;Gait training;Stair training;Functional mobility training;Therapeutic activities;Therapeutic exercise;Neuromuscular re-education;Balance training;Patient/family education    PT Goals (Current goals can be found in the Care Plan section)  Acute Rehab PT Goals Patient Stated Goal: None stated PT Goal Formulation: With family Time For Goal Achievement: 05/21/18 Potential to Achieve Goals: Fair    Frequency Min 3X/week   Barriers to discharge Inaccessible home environment Stairs, could not navigate at this level.    Co-evaluation               AM-PAC PT "6 Clicks" Daily Activity  Outcome Measure Difficulty turning over in bed (including adjusting bedclothes, sheets and blankets)?: A Lot Difficulty moving from lying on back to sitting on the side of the bed? : A Lot Difficulty sitting down on and standing up from a chair with arms (e.g., wheelchair, bedside commode, etc,.)?: A Lot Help needed moving to and from a bed to chair (including a wheelchair)?: A Lot Help needed walking in hospital room?: A Lot Help needed climbing 3-5 steps with a railing? : Total 6 Click Score: 11    End of Session Equipment Utilized During Treatment: Gait belt Activity Tolerance: Patient limited by pain Patient left: in chair;with call bell/phone within reach;with chair alarm set;with family/visitor present Nurse Communication: Mobility status PT Visit Diagnosis: Other abnormalities of gait and mobility (R26.89);Muscle weakness (generalized) (M62.81);Difficulty in walking, not elsewhere classified (R26.2);Pain Pain - Right/Left: Right Pain - part of body: Knee    Time: 1551-1630 PT Time Calculation (min) (ACUTE ONLY): 39 min   Charges:  PT Evaluation $PT Eval Moderate Complexity: 1 Mod PT Treatments $Gait Training: 8-22 mins $Therapeutic  Activity: 8-22 mins        Elayne Snare, PT, DPT  Ellouise Newer 05/07/2018, 5:12 PM

## 2018-05-08 LAB — GLUCOSE, CAPILLARY
Glucose-Capillary: 78 mg/dL (ref 70–99)
Glucose-Capillary: 120 mg/dL — ABNORMAL HIGH (ref 70–99)
Glucose-Capillary: 145 mg/dL — ABNORMAL HIGH (ref 70–99)

## 2018-05-08 NOTE — Progress Notes (Signed)
Hospitalist follow-up note:  Consulted on 10/12 for altered mental status.  Work-up negative including no findings on MRI.  Normal ammonia level and ABG unrevealing.  Felt to be medication related and with stopping her muscle relaxer, patient found to be much improved.  Husband at the bedside today.  He says patient is back to her baseline.  Patient herself doing well, oriented x3.  No Soo president is.  Plan for awaiting wound cultures, continue hemodialysis, skilled nursing by social work.  Stable at this time.  Will sign off.  Please do not hesitate to contact us if we can be of further assistance.

## 2018-05-08 NOTE — Progress Notes (Addendum)
Subjective: This am more alert and coherent / Pain in Knee controlled /stable , husband in room   Objective Vital signs in last 24 hours: Vitals:   05/07/18 2010 05/07/18 2124 05/08/18 0405 05/08/18 0834  BP: (!) 141/54  (!) 143/58   Pulse: 85  88   Resp: 16  16   Temp: 98.6 F (37 C)  98.9 F (37.2 C)   TempSrc: Oral  Oral   SpO2: 92% 94% 100% 95%  Weight:      Height:       Weight change:   Physical Exam: General:OX3, alert thin , Chronically ill Female NAD,  Heart:RRR, no m,r,g Lungs:CTA ,non labored breathing Abdomen:soft NTND Extremities:Right knee surg dressing dry clean, bilat no  pedal edema Dialysis Access:pos bruit LUA AVG  OP HD: [place] MWF 4h   61kg  3K/2.25 bath  Hep 2500 +1000 midrun  LUA AVG - Hec 53mcg IV/HD  - Mircera NONE Last HGB 11.3   Problem/Plan: 1. ESRD -HD MWF,on schedule / No Hep with h/o SDH 2. R/ORight Knee Infection with Degenerative tear Medal Meniscus, Old complex Tear Lateral Meniscus sp Arthroscopy surgery- Plan per Dr Sharol Given and ID seeing, on Vanco and [place] / Zosyn  3. Hypertension/volume -Am bp stable / op meds Coreg 12.5 Bid . (at [facility] Amlodipine 10 mg hs and Meto. 25 mg bid) now only on Coreg  12.5 mg bid / Uf 1351 cc On hd yest , stable vol by exam . 4. Anemia - HGB 9.5 >9.2  ,no ESA at center, Given Aranesp 40 mcg 05/06/18 hd (q Friday hd) check iron studies pre hd in am  5. Metabolic bone disease -corec ca 9.3/ phos 7.5 Hectorol on HD, Sensipar 60 mg Q day, last OP  Lab note ="missing some Auryxia" continue 2ac in mch  6. HO PAD sp bilat Transmet amp 7. HO SDH Jan 2019 SP Craniotomy - stable ,no hep hd  8. DM type 2 - per admit \ 9. 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  Kelly Splinter MD [facility] pager [phone]   05/08/2018,  12:05 PM    Labs: Basic Metabolic Panel: Recent Labs  Lab 05/06/18 0824 05/06/18 1511 05/07/18 1016  NA 128* 130* 132*  K 4.4 4.3 4.5  CL  --  92* 96*  CO2  --  24 23  GLUCOSE 150* 121* 171*  BUN  --  41* 22  CREATININE  --  5.62* 4.33*  CALCIUM  --  8.8* 8.0*  PHOS  --  7.5*  --    Liver Function Tests: Recent Labs  Lab 05/06/18 1511 05/07/18 1016  AST  --  23  ALT  --  14  ALKPHOS  --  134*  BILITOT  --  0.5  PROT  --  6.5  ALBUMIN 2.6* 2.4*   No results for input(s): LIPASE, AMYLASE in the last 168 hours. Recent Labs  Lab 05/07/18 1016  AMMONIA 25   CBC: Recent Labs  Lab 05/06/18 0824 05/06/18 1511 05/07/18 1016  WBC  --  10.9* 12.7*  NEUTROABS  --   --  9.6*  HGB 11.2* 9.5* 9.2*  HCT 33.0* 29.7* 30.1*  MCV  --  93.7 96.5  PLT  --  305 283   Cardiac Enzymes: No results for input(s): CKTOTAL, CKMB, CKMBINDEX, TROPONINI in  the last 168 hours. CBG: Recent Labs  Lab 05/07/18 0855 05/07/18 1201 05/07/18 1616 05/07/18 2136 05/08/18 0637  GLUCAP 177* 145* 186* 132* 78    Studies/Results: Ct Head Wo Contrast  Result Date: 05/07/2018 CLINICAL DATA:  Encephalopathy. EXAM: CT HEAD WITHOUT CONTRAST TECHNIQUE: Contiguous axial images were obtained from the base of the skull through the vertex without intravenous contrast. COMPARISON:  Head CT dated 09/28/2017. FINDINGS: Brain: Low-density areas are now present within the bilateral periventricular and subcortical white matter regions, more prominent than on previous studies. There is no parenchymal or extra-axial hemorrhage appreciated on today's exam. The recent LEFT-sided subdural hematoma has resolved. Vascular: Chronic calcified atherosclerotic changes of the large vessels at the skull base. No unexpected hyperdense vessel. Skull: No acute findings. LEFT frontoparietal craniotomy appears well aligned. Sinuses/Orbits: No acute finding. Other: None. IMPRESSION: 1. Low-density within the periventricular  white matter regions bilaterally. This may represent typical chronic small vessel ischemic change, however, it is more prominently seen on today's study which may indicate superimposed edema. Given the history of encephalopathy, would consider brain MRI for further characterization. 2. No intracranial hemorrhage. LEFT-sided subdural hematoma has resolved. Electronically Signed   By: Franki Cabot M.D.   On: 05/07/2018 11:47   Mr Brain Wo Contrast  Result Date: 05/07/2018 CLINICAL DATA:  Encephalopathy. EXAM: MRI HEAD WITHOUT CONTRAST TECHNIQUE: Multiplanar, multiecho pulse sequences of the brain and surrounding structures were obtained without intravenous contrast. COMPARISON:  Head CT 05/07/2018 FINDINGS: BRAIN: There is no acute infarct, acute hemorrhage, hydrocephalus or extra-axial collection. The midline structures are normal. No midline shift or other mass effect. There are no old infarcts. Diffuse confluent hyperintense T2-weighted signal within the periventricular, deep and juxtacortical white matter, most commonly due to chronic ischemic microangiopathy. Generalized atrophy without lobar predilection. Susceptibility-sensitive sequences show no chronic microhemorrhage or superficial siderosis. VASCULAR: Major intracranial arterial and venous sinus flow voids are normal. SKULL AND UPPER CERVICAL SPINE: Remote left pterional craniotomy. Calvarial bone marrow signal is normal. There is no skull base mass. Visualized upper cervical spine and soft tissues are normal. SINUSES/ORBITS: Small amount of fluid within both mastoids. Mild sphenoid sinus mucosal thickening. The orbits are normal. IMPRESSION: 1. No acute intracranial abnormality. 2. Chronic microvascular ischemic changes of the white matter. Electronically Signed   By: Ulyses Jarred M.D.   On: 05/07/2018 23:37   Medications: . sodium chloride Stopped (05/06/18 1332)  . [START ON 05/09/2018] cefTAZidime (FORTAZ)  IV     . aspirin EC  81 mg Oral  Daily  . atorvastatin  20 mg Oral QHS  . carvedilol  12.5 mg Oral BID WC  . cinacalcet  60 mg Oral Q supper  . clopidogrel  75 mg Oral Daily  . darbepoetin (ARANESP) injection - DIALYSIS  40 mcg Intravenous Q Fri-HD  . docusate sodium  100 mg Oral BID  . [START ON 05/09/2018] doxercalciferol  3 mcg Intravenous Q M,W,F-HD  . ferric citrate  420 mg Oral TID WC  . gabapentin  100 mg Oral QHS  . insulin aspart  0-9 Units Subcutaneous TID WC  . insulin aspart  3 Units Subcutaneous TID WC  . insulin glargine  10 Units Subcutaneous QHS  . isosorbide-hydrALAZINE  1 tablet Oral TID  . mometasone-formoterol  2 puff Inhalation BID  . multivitamin  1 tablet Oral Daily  . pantoprazole  40 mg Oral Daily  . [START ON 05/09/2018] vancomycin  500 mg Intravenous Q M,W,F-HD

## 2018-05-08 NOTE — Progress Notes (Signed)
Subjective: Patient stable.  Much more alert this morning.  Pain controlled   Objective: Vital signs in last 24 hours: Temp:  [97.9 F (36.6 C)-98.9 F (37.2 C)] 98.9 F (37.2 C) (10/13 0405) Pulse Rate:  [79-88] 88 (10/13 0405) Resp:  [16] 16 (10/13 0405) BP: (141-143)/(50-58) 143/58 (10/13 0405) SpO2:  [92 %-100 %] 95 % (10/13 0834)  Intake/Output from previous day: 10/12 0701 - 10/13 0700 In: 240 [P.O.:240] Out: -  Intake/Output this shift: Total I/O In: 240 [P.O.:240] Out: -   Exam:  Dorsiflexion/Plantar flexion intact  Labs: Recent Labs    05/06/18 0824 05/06/18 1511 05/07/18 1016  HGB 11.2* 9.5* 9.2*   Recent Labs    05/06/18 1511 05/07/18 1016  WBC 10.9* 12.7*  RBC 3.17* 3.12*  HCT 29.7* 30.1*  PLT 305 283   Recent Labs    05/06/18 1511 05/07/18 1016  NA 130* 132*  K 4.3 4.5  CL 92* 96*  CO2 24 23  BUN 41* 22  CREATININE 5.62* 4.33*  GLUCOSE 121* 171*  CALCIUM 8.8* 8.0*   No results for input(s): LABPT, INR in the last 72 hours.  Assessment/Plan: Plan at this time is to continue medical treatment with discharge early this week Appreciate medical consultant involvement in this case  Anderson Malta 05/08/2018, 10:13 AM

## 2018-05-08 NOTE — Progress Notes (Signed)
Physical Therapy Treatment Patient Details Name: Destiny Day MRN: [id number] DOB: November 21, 1950 Today's Date: 05/08/2018    History of Present Illness  Destiny Day is a  67 y.o. female with a diagnosis of Septic Right Knee , s/p Partial medial and lateral meniscectomy,  Abrasion chondroplasty medial femoral condyle medial tibial plateau lateral femoral condyle lateral tibial plateau, Removal of loose body 10 mm in diameter. Hx of bil transmetatarsal amputation,  CHF, anxiety, and ESRD dialysis MWF.    PT Comments     Continuing work on functional mobility and activity tolerance;  Extremely painful R knee, unable to perform any meaningful range of motion this session due to pain (RN gave pain meds during session); but Destiny Day was able to participate more in mobility activities, needing less assist for getting up to the EOB, and still able to tolerate in room amb and getting to the recliner despite pain; We discussed asking the nurse about the possibility of maybe going to HD (in hospital) in [place];   Discussed dc plans, and Destiny Day shows good awareness of what she needs to be able to do in order to get home; She agrees to SNF for post-acute rehab to maximize independence and safety with mobility prior to dc home; she is interested in going to [facility] (on [address]);   Will continue acute efforts at range and mobility -- will plan to call ahead and premedicate for pain.  Follow Up Recommendations  SNF     Equipment Recommendations  Rolling walker with 5" wheels;3in1 (PT);Other (comment)(will consider youth-sized RW)    Recommendations for Other Services       Precautions / Restrictions Precautions Precautions: Fall Restrictions RLE Weight Bearing: Weight bearing as tolerated    Mobility  Bed Mobility Overal bed mobility: Needs Assistance Bed Mobility: Supine to Sit     Supine to sit: Mod assist     General bed mobility comments: Much improved; used rails to  pull trunk over then push to sit; mod assist to help/support RLE off of the bed  Transfers Overall transfer level: Needs assistance Equipment used: Rolling walker (2 wheeled) Transfers: Sit to/from Stand Sit to Stand: Mod assist;+2 physical assistance         General transfer comment: +2 mod assist for boost to stand, VC for technique and hand placement, accepting minimal weight through RLE to rise. Max cues for upright posture  Ambulation/Gait Ambulation/Gait assistance: Mod assist;+2 physical assistance Gait Distance (Feet): 6 Feet Assistive device: Rolling walker (2 wheeled) Gait Pattern/deviations: Step-to pattern;Decreased step length - right;Decreased step length - left;Decreased stance time - right;Decreased stride length;Trunk flexed Gait velocity: slow   General Gait Details: Tolerated short distance in room with +2 mod assist for bilateral support and sequencing, and weight shift when advancing LLE as she accepted weight through RLE. Max VC for sequencing, upright posture, and safety. Required assist to advance and navigate walker   Stairs             Wheelchair Mobility    Modified Rankin (Stroke Patients Only)       Balance     Sitting balance-Leahy Scale: Fair       Standing balance-Leahy Scale: Poor Standing balance comment: Able to stand for short periods unsupported but required UE support on RW.                            Cognition Arousal/Alertness: Awake/alert Behavior During Therapy:  Flat affect Overall Cognitive Status: Within Functional Limits for tasks assessed(for simple mobility tasks)                                 General Comments: Much improved mentation today than previous session      Exercises      General Comments        Pertinent Vitals/Pain Pain Assessment: Faces Faces Pain Scale: Hurts whole lot Pain Location: Rt knee when moved Pain Descriptors / Indicators:  Aching;Grimacing;Burning Pain Intervention(s): Monitored during session;Repositioned;RN gave pain meds during session    Home Living                      Prior Function            PT Goals (current goals can now be found in the care plan section) Acute Rehab PT Goals Patient Stated Goal: None stated PT Goal Formulation: With family Time For Goal Achievement: 05/21/18 Potential to Achieve Goals: Fair Progress towards PT goals: Progressing toward goals(very slowly)    Frequency    Min 3X/week      PT Plan Current plan remains appropriate    Co-evaluation              AM-PAC PT "6 Clicks" Daily Activity  Outcome Measure  Difficulty turning over in bed (including adjusting bedclothes, sheets and blankets)?: A Lot Difficulty moving from lying on back to sitting on the side of the bed? : A Lot Difficulty sitting down on and standing up from a chair with arms (e.g., wheelchair, bedside commode, etc,.)?: Unable Help needed moving to and from a bed to chair (including a wheelchair)?: A Lot Help needed walking in hospital room?: A Lot Help needed climbing 3-5 steps with a railing? : Total 6 Click Score: 10    End of Session Equipment Utilized During Treatment: Gait belt Activity Tolerance: Patient limited by pain Patient left: in chair;with call bell/phone within reach;with chair alarm set Nurse Communication: Mobility status PT Visit Diagnosis: Other abnormalities of gait and mobility (R26.89);Muscle weakness (generalized) (M62.81);Difficulty in walking, not elsewhere classified (R26.2);Pain Pain - Right/Left: Right Pain - part of body: Knee     Time: 2334-3568 PT Time Calculation (min) (ACUTE ONLY): 35 min  Charges:  $Gait Training: 8-22 mins $Therapeutic Activity: 8-22 mins                     Roney Marion, PT  Acute Rehabilitation Services Pager [phone] Office [place] 05/08/2018, 12:53 PM

## 2018-05-08 NOTE — Plan of Care (Signed)
  Problem: Coping: Goal: Level of anxiety will decrease Outcome: Progressing   Problem: Pain Managment: Goal: General experience of comfort will improve Outcome: Progressing   Problem: Safety: Goal: Ability to remain free from injury will improve Outcome: Progressing   Problem: Skin Integrity: Goal: Risk for impaired skin integrity will decrease Outcome: Progressing   

## 2018-05-08 NOTE — Progress Notes (Signed)
Vitals, labs reviewed.  His Cx is showing S aureus, no sensi yet.  Will narrow anbx to vanco alone.  Will f/u 10-14.

## 2018-05-08 NOTE — Progress Notes (Signed)
Patient is back on the floor from Radiology. Patient had one episode of vomiting. Will continue to monitor.

## 2018-05-08 NOTE — Plan of Care (Signed)
  Problem: Safety: Goal: Ability to remain free from injury will improve Outcome: Progressing   Problem: Pain Managment: Goal: General experience of comfort will improve Outcome: Progressing   Problem: Elimination: Goal: Will not experience complications related to bowel motility Outcome: Progressing   Problem: Nutrition: Goal: Adequate nutrition will be maintained Outcome: Progressing   Problem: Activity: Goal: Risk for activity intolerance will decrease Outcome: Progressing

## 2018-05-09 DIAGNOSIS — B9561 Methicillin susceptible Staphylococcus aureus infection as the cause of diseases classified elsewhere: Secondary | ICD-10-CM

## 2018-05-09 DIAGNOSIS — M00061 Staphylococcal arthritis, right knee: Secondary | ICD-10-CM

## 2018-05-09 LAB — CBC
HCT: 27.8 % — ABNORMAL LOW (ref 36.0–46.0)
Hemoglobin: 8.4 g/dL — ABNORMAL LOW (ref 12.0–15.0)
MCH: 28.9 pg (ref 26.0–34.0)
MCHC: 30.2 g/dL (ref 30.0–36.0)
MCV: 95.5 fL (ref 80.0–100.0)
Platelets: 459 10*3/uL — ABNORMAL HIGH (ref 150–400)
RBC: 2.91 MIL/uL — ABNORMAL LOW (ref 3.87–5.11)
RDW: 14.3 % (ref 11.5–15.5)
WBC: 13.5 10*3/uL — ABNORMAL HIGH (ref 4.0–10.5)
nRBC: 0 % (ref 0.0–0.2)

## 2018-05-09 LAB — RENAL FUNCTION PANEL
Albumin: 2.2 g/dL — ABNORMAL LOW (ref 3.5–5.0)
Anion gap: 17 — ABNORMAL HIGH (ref 5–15)
BUN: 50 mg/dL — ABNORMAL HIGH (ref 8–23)
CO2: 22 mmol/L (ref 22–32)
Calcium: 8.1 mg/dL — ABNORMAL LOW (ref 8.9–10.3)
Chloride: 92 mmol/L — ABNORMAL LOW (ref 98–111)
Creatinine, Ser: 7.82 mg/dL — ABNORMAL HIGH (ref 0.44–1.00)
GFR calc Af Amer: 5 mL/min — ABNORMAL LOW (ref >60)
GFR calc non Af Amer: 5 mL/min — ABNORMAL LOW (ref >60)
Glucose, Bld: 99 mg/dL (ref 70–99)
Phosphorus: 5.9 mg/dL — ABNORMAL HIGH (ref 2.5–4.6)
Potassium: 4.6 mmol/L (ref 3.5–5.1)
Sodium: 131 mmol/L — ABNORMAL LOW (ref 135–145)

## 2018-05-09 LAB — GLUCOSE, CAPILLARY
Glucose-Capillary: 96 mg/dL (ref 70–99)
Glucose-Capillary: 138 mg/dL — ABNORMAL HIGH (ref 70–99)
Glucose-Capillary: 139 mg/dL — ABNORMAL HIGH (ref 70–99)

## 2018-05-09 MED ORDER — DOXERCALCIFEROL 4 MCG/2ML IV SOLN
INTRAVENOUS | Status: AC
  Administered 2018-05-09: 3 ug via INTRAVENOUS
  Filled 2018-05-09: qty 2

## 2018-05-09 NOTE — Progress Notes (Signed)
Patient ID: Destiny Day, female   DOB: June 27, 1951, 67 y.o.   MRN: [id number] Patient is alert and oriented this morning.  Patient states she is unable to get up and down the steps she has at home and request discharge to skilled nursing.  Orders written for social work evaluation for skilled nursing placement.  Patient's cultures show staph aureus but no sensitivities available yet.  Anticipate discharge on IV antibiotics with dialysis.

## 2018-05-09 NOTE — Progress Notes (Addendum)
[facility] Progress Note   Subjective:   Sitting in bed eating breakfast.  Husband at bedside. Per patient requested to be d/c to SNF because she is unable to get up and down stairs at her house.  Dr. Sharol Given requested SW eval. No other complaints. Pain well controlled.   Objective Vitals:   05/08/18 2059 05/09/18 0515 05/09/18 0849 05/09/18 0907  BP: (!) 119/54 (!) 143/63 137/64   Pulse: 82 83 85   Resp:      Temp: 97.6 F (36.4 C) 98.5 F (36.9 C) 97.8 F (36.6 C)   TempSrc: Oral Oral Oral   SpO2: 100% 100% 100% 95%  Weight:      Height:       Physical Exam General:NAD, chronically ill appearing female Heart:RRR, no mrg Lungs:CTAB, nml WOB Abdomen:soft, NTND Extremities:R knee dressed, no edema, b/l TMA Dialysis Access: LU AVG +b/t   Filed Weights   05/06/18 0718 05/06/18 1400  Weight: 66.2 kg 62.7 kg    Intake/Output Summary (Last 24 hours) at 05/09/2018 1023 Last data filed at 05/09/2018 0852 Gross per 24 hour  Intake 720 ml  Output -  Net 720 ml    Additional Objective Labs: Basic Metabolic Panel: Recent Labs  Lab 05/06/18 0824 05/06/18 1511 05/07/18 1016  NA 128* 130* 132*  K 4.4 4.3 4.5  CL  --  92* 96*  CO2  --  24 23  GLUCOSE 150* 121* 171*  BUN  --  41* 22  CREATININE  --  5.62* 4.33*  CALCIUM  --  8.8* 8.0*  PHOS  --  7.5*  --    Liver Function Tests: Recent Labs  Lab 05/06/18 1511 05/07/18 1016  AST  --  23  ALT  --  14  ALKPHOS  --  134*  BILITOT  --  0.5  PROT  --  6.5  ALBUMIN 2.6* 2.4*   CBC: Recent Labs  Lab 05/06/18 0824 05/06/18 1511 05/07/18 1016  WBC  --  10.9* 12.7*  NEUTROABS  --   --  9.6*  HGB 11.2* 9.5* 9.2*  HCT 33.0* 29.7* 30.1*  MCV  --  93.7 96.5  PLT  --  305 283   Blood Culture    Component Value Date/Time   SDES BLOOD RIGHT ANTECUBITAL 05/07/2018 1029   SPECREQUEST  05/07/2018 1029    BOTTLES DRAWN AEROBIC ONLY Blood Culture adequate volume   CULT  05/07/2018 1029    NO GROWTH <  24 HOURS Performed at [facility] [address] [place], [place] [address]    REPTSTATUS PENDING 05/07/2018 1029   CBG: Recent Labs  Lab 05/07/18 2136 05/08/18 0637 05/08/18 1155 05/08/18 2254 05/09/18 0711  GLUCAP 132* 78 120* 145* 96   Studies/Results: Ct Head Wo Contrast  Result Date: 05/07/2018 CLINICAL DATA:  Encephalopathy. EXAM: CT HEAD WITHOUT CONTRAST TECHNIQUE: Contiguous axial images were obtained from the base of the skull through the vertex without intravenous contrast. COMPARISON:  Head CT dated 09/28/2017. FINDINGS: Brain: Low-density areas are now present within the bilateral periventricular and subcortical white matter regions, more prominent than on previous studies. There is no parenchymal or extra-axial hemorrhage appreciated on today's exam. The recent LEFT-sided subdural hematoma has resolved. Vascular: Chronic calcified atherosclerotic changes of the large vessels at the skull base. No unexpected hyperdense vessel. Skull: No acute findings. LEFT frontoparietal craniotomy appears well aligned. Sinuses/Orbits: No acute finding. Other: None. IMPRESSION: 1. Low-density within the periventricular white matter regions  bilaterally. This may represent typical chronic small vessel ischemic change, however, it is more prominently seen on today's study which may indicate superimposed edema. Given the history of encephalopathy, would consider brain MRI for further characterization. 2. No intracranial hemorrhage. LEFT-sided subdural hematoma has resolved. Electronically Signed   By: Franki Cabot M.D.   On: 05/07/2018 11:47   Mr Brain Wo Contrast  Result Date: 05/07/2018 CLINICAL DATA:  Encephalopathy. EXAM: MRI HEAD WITHOUT CONTRAST TECHNIQUE: Multiplanar, multiecho pulse sequences of the brain and surrounding structures were obtained without intravenous contrast. COMPARISON:  Head CT 05/07/2018 FINDINGS: BRAIN: There is no acute infarct, acute hemorrhage, hydrocephalus  or extra-axial collection. The midline structures are normal. No midline shift or other mass effect. There are no old infarcts. Diffuse confluent hyperintense T2-weighted signal within the periventricular, deep and juxtacortical white matter, most commonly due to chronic ischemic microangiopathy. Generalized atrophy without lobar predilection. Susceptibility-sensitive sequences show no chronic microhemorrhage or superficial siderosis. VASCULAR: Major intracranial arterial and venous sinus flow voids are normal. SKULL AND UPPER CERVICAL SPINE: Remote left pterional craniotomy. Calvarial bone marrow signal is normal. There is no skull base mass. Visualized upper cervical spine and soft tissues are normal. SINUSES/ORBITS: Small amount of fluid within both mastoids. Mild sphenoid sinus mucosal thickening. The orbits are normal. IMPRESSION: 1. No acute intracranial abnormality. 2. Chronic microvascular ischemic changes of the white matter. Electronically Signed   By: Ulyses Jarred M.D.   On: 05/07/2018 23:37    Medications: . sodium chloride Stopped (05/06/18 1332)   . aspirin EC  81 mg Oral Daily  . atorvastatin  20 mg Oral QHS  . carvedilol  12.5 mg Oral BID WC  . cinacalcet  60 mg Oral Q supper  . clopidogrel  75 mg Oral Daily  . darbepoetin (ARANESP) injection - DIALYSIS  40 mcg Intravenous Q Fri-HD  . docusate sodium  100 mg Oral BID  . doxercalciferol  3 mcg Intravenous Q M,W,F-HD  . ferric citrate  420 mg Oral TID WC  . gabapentin  100 mg Oral QHS  . insulin aspart  0-9 Units Subcutaneous TID WC  . insulin aspart  3 Units Subcutaneous TID WC  . insulin glargine  10 Units Subcutaneous QHS  . isosorbide-hydrALAZINE  1 tablet Oral TID  . mometasone-formoterol  2 puff Inhalation BID  . multivitamin  1 tablet Oral Daily  . pantoprazole  40 mg Oral Daily  . vancomycin  500 mg Intravenous Q M,W,F-HD    Dialysis Orders: [facility] MWF 4h   61kg  3K/2.25 bath  Hep 2500 +1000 midrun  LUA AVG - Hec  60mcg IV/HD  - Mircera NONE Last HGB 11.3  Assessment/Plan: 1. Septic R knee arthritis - +fluid cx for S. Aureus. ID is evaluating.  2. ESRD - on HD MWF. Orders written for today per regular schedule. NO HEP w/Hx SDH. 3. Anemia of CKD- Hgb 9.2. Given Aranesp 73mcg on 05/06/18, checking iron studies pre HD today. 4. Secondary hyperparathyroidism - Phos 7.5, Ca 8.0.  Continue binders, sensipar and VDRA. 5. HTN/volume - BP variable, mostly at goal.  On coreg and BiDil. Close to EDW, does not appear volume overloaded on exam.  Titrate down volume as tolerated.  6. Nutrition - Alb 2.4, Renal diet with fluid restrictions. Renavite, Nepro.   7. Hx PAD s/p b/l TMA 8. 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 DMT2 - per primary 10. COPD - on inhalers 11. Dispo - per patient  and family would like to be referred to SNF due to inability to ambulate up the stairs into her home. SW eval pending for placement.  Ok to d/c from renal standpoint.   Jen Mow, PA-C [facility] Pager: [phone] 05/09/2018,10:23 AM  LOS: 3 days   Pt seen, examined and agree w A/P as above.  Kelly Splinter MD [employer] pager [phone]   05/09/2018, 4:03 PM

## 2018-05-09 NOTE — Procedures (Signed)
Hemodialysis Treatment summary 1443 - Trmt initiated per policy and orders. 1620 - System clotted without warning d/t clot in venous chamber.  Rinsed back art line, unable to return remainder of system. 1630 - Trmt reinitiated without difficulty. 1740 - Pt moaning, c/o rectal pain, states she needs to have BM.  Patient placed on bedpan. 1800 - Pt cont to c/o rectal pain, difficulty passing stool.  Requests to remain on bedpan. 1815 - Pt vomited moderate amt dark undigested food, c/o nausea.   1818 - Pt trmt ended d/t to ongoing rectal pain, and nausea per patient request.   8614 - Needles pulled without difficulty, bed linens and pt gown changed.  Patient did pass a small amount of dark stool. 1900 - Report called to Meribeth Mattes, RN.  All of above reported.

## 2018-05-09 NOTE — Progress Notes (Signed)
INFECTIOUS DISEASE PROGRESS NOTE  ID: Destiny Day is a 67 y.o. female with  Active Problems:   Degenerative tear of posterior horn of medial meniscus, right   Old complex tear of lateral meniscus of right knee   Loose body in knee, right knee   Septic arthritis of knee, right (HCC)  Subjective: No complaints.  Knee still sore.   Abtx:  Anti-infectives (From admission, onward)   Start     Dose/Rate Route Frequency Ordered Stop   05/09/18 2000  cefTAZidime (FORTAZ) 2 g in sodium chloride 0.9 % 100 mL IVPB  Status:  Discontinued     2 g 200 mL/hr over 30 Minutes Intravenous Every M-W-F (Hemodialysis) 05/06/18 1823 05/08/18 1147   05/09/18 1200  vancomycin (VANCOCIN) IVPB 500 mg/100 ml premix     500 mg 100 mL/hr over 60 Minutes Intravenous Every M-W-F (Hemodialysis) 05/06/18 1827     05/06/18 2000  cefTAZidime (FORTAZ) 2 g in sodium chloride 0.9 % 100 mL IVPB     2 g 200 mL/hr over 30 Minutes Intravenous  Once 05/06/18 1834 05/06/18 2219   05/06/18 1330  vancomycin (VANCOCIN) 1,500 mg in sodium chloride 0.9 % 500 mL IVPB     1,500 mg 250 mL/hr over 120 Minutes Intravenous To Hemodialysis 05/06/18 1231 05/06/18 1913   05/06/18 1230  piperacillin-tazobactam (ZOSYN) IVPB 3.375 g  Status:  Discontinued     3.375 g 12.5 mL/hr over 240 Minutes Intravenous Every 12 hours 05/06/18 1132 05/06/18 1746   05/06/18 1215  vancomycin (VANCOCIN) 1,500 mg in sodium chloride 0.9 % 500 mL IVPB  Status:  Discontinued     1,500 mg 250 mL/hr over 120 Minutes Intravenous  Once 05/06/18 1132 05/06/18 1839      Medications:  Scheduled: . aspirin EC  81 mg Oral Daily  . atorvastatin  20 mg Oral QHS  . carvedilol  12.5 mg Oral BID WC  . cinacalcet  60 mg Oral Q supper  . clopidogrel  75 mg Oral Daily  . darbepoetin (ARANESP) injection - DIALYSIS  40 mcg Intravenous Q Fri-HD  . docusate sodium  100 mg Oral BID  . doxercalciferol  3 mcg Intravenous Q M,W,F-HD  . ferric citrate  420 mg Oral  TID WC  . gabapentin  100 mg Oral QHS  . insulin aspart  0-9 Units Subcutaneous TID WC  . insulin aspart  3 Units Subcutaneous TID WC  . insulin glargine  10 Units Subcutaneous QHS  . isosorbide-hydrALAZINE  1 tablet Oral TID  . mometasone-formoterol  2 puff Inhalation BID  . multivitamin  1 tablet Oral Daily  . pantoprazole  40 mg Oral Daily  . vancomycin  500 mg Intravenous Q M,W,F-HD    Objective: Vital signs in last 24 hours: Temp:  [97.6 F (36.4 C)-98.6 F (37 C)] 97.8 F (36.6 C) (10/14 0849) Pulse Rate:  [79-85] 85 (10/14 0849) Resp:  [16-18] 18 (10/13 2053) BP: (119-143)/(44-64) 137/64 (10/14 0849) SpO2:  [91 %-100 %] 95 % (10/14 0907)   General appearance: alert, cooperative and no distress Resp: clear to auscultation bilaterally Cardio: regular rate and rhythm GI: normal findings: bowel sounds normal and soft, non-tender Extremities: R knee tender, dressed.   Lab Results Recent Labs    05/06/18 1511 05/07/18 1016  WBC 10.9* 12.7*  HGB 9.5* 9.2*  HCT 29.7* 30.1*  NA 130* 132*  K 4.3 4.5  CL 92* 96*  CO2 24 23  BUN 41* 22  CREATININE  5.62* 4.33*   Liver Panel Recent Labs    05/06/18 1511 05/07/18 1016  PROT  --  6.5  ALBUMIN 2.6* 2.4*  AST  --  23  ALT  --  14  ALKPHOS  --  134*  BILITOT  --  0.5   Sedimentation Rate No results for input(s): ESRSEDRATE in the last 72 hours. C-Reactive Protein No results for input(s): CRP in the last 72 hours.  Microbiology: Recent Results (from the past 240 hour(s))  Anaerobic and Aerobic Culture     Status: Abnormal (Preliminary result)   Collection Time: 05/05/18  2:07 PM  Result Value Ref Range Status   MICRO NUMBER: [id number]  Preliminary   SPECIMEN QUALITY: ADEQUATE  Preliminary   Source: NOT GIVEN  Preliminary   STATUS: PRELIMINARY  Preliminary   GRAM STAIN:   Preliminary    Many Polymorphonuclear leukocytes No organisms seen   MICRO NUMBER: [id number]  Preliminary   SPECIMEN QUALITY: ADEQUATE   Preliminary   SOURCE: NOT GIVEN  Preliminary   STATUS: PRELIMINARY  Preliminary   AER ISOLATE 1: Staphylococcus aureus (A)  Preliminary    Comment: Moderate growth of Staphylococcus aureus , susceptibility test report to follow.  Aerobic/Anaerobic Culture (surgical/deep wound)     Status: None (Preliminary result)   Collection Time: 05/06/18  9:18 AM  Result Value Ref Range Status   Specimen Description KNEE RIGTH ABSCESS PUS  Final   Special Requests NONE  Final   Gram Stain   Final    MODERATE WBC PRESENT, PREDOMINANTLY PMN NO ORGANISMS SEEN Performed at [facility], [address], [place], [place] [address]    Culture   Final    MODERATE STAPHYLOCOCCUS AUREUS SUSCEPTIBILITIES TO FOLLOW NO ANAEROBES ISOLATED; CULTURE IN PROGRESS FOR 5 DAYS    Report Status PENDING  Incomplete  Culture, blood (routine x 2)     Status: None (Preliminary result)   Collection Time: 05/07/18 10:20 AM  Result Value Ref Range Status   Specimen Description BLOOD RIGHT ANTECUBITAL  Final   Special Requests   Final    BOTTLES DRAWN AEROBIC ONLY Blood Culture adequate volume   Culture   Final    NO GROWTH < 24 HOURS Performed at [facility], [place] [address] [place], [place] [address]    Report Status PENDING  Incomplete  Culture, blood (routine x 2)     Status: None (Preliminary result)   Collection Time: 05/07/18 10:29 AM  Result Value Ref Range Status   Specimen Description BLOOD RIGHT ANTECUBITAL  Final   Special Requests   Final    BOTTLES DRAWN AEROBIC ONLY Blood Culture adequate volume   Culture   Final    NO GROWTH < 24 HOURS Performed at [facility], [place] [address], [place], [place] [address]    Report Status PENDING  Incomplete    Studies/Results: Ct Head Wo Contrast  Result Date: 05/07/2018 CLINICAL DATA:  Encephalopathy. EXAM: CT HEAD WITHOUT CONTRAST TECHNIQUE: Contiguous axial images were obtained from the base of the skull through the vertex without  intravenous contrast. COMPARISON:  Head CT dated 09/28/2017. FINDINGS: Brain: Low-density areas are now present within the bilateral periventricular and subcortical white matter regions, more prominent than on previous studies. There is no parenchymal or extra-axial hemorrhage appreciated on today's exam. The recent LEFT-sided subdural hematoma has resolved. Vascular: Chronic calcified atherosclerotic changes of the large vessels at the skull base. No unexpected hyperdense vessel. Skull: No acute findings.  LEFT frontoparietal craniotomy appears well aligned. Sinuses/Orbits: No acute finding. Other: None. IMPRESSION: 1. Low-density within the periventricular white matter regions bilaterally. This may represent typical chronic small vessel ischemic change, however, it is more prominently seen on today's study which may indicate superimposed edema. Given the history of encephalopathy, would consider brain MRI for further characterization. 2. No intracranial hemorrhage. LEFT-sided subdural hematoma has resolved. Electronically Signed   By: Franki Cabot M.D.   On: 05/07/2018 11:47   Mr Brain Wo Contrast  Result Date: 05/07/2018 CLINICAL DATA:  Encephalopathy. EXAM: MRI HEAD WITHOUT CONTRAST TECHNIQUE: Multiplanar, multiecho pulse sequences of the brain and surrounding structures were obtained without intravenous contrast. COMPARISON:  Head CT 05/07/2018 FINDINGS: BRAIN: There is no acute infarct, acute hemorrhage, hydrocephalus or extra-axial collection. The midline structures are normal. No midline shift or other mass effect. There are no old infarcts. Diffuse confluent hyperintense T2-weighted signal within the periventricular, deep and juxtacortical white matter, most commonly due to chronic ischemic microangiopathy. Generalized atrophy without lobar predilection. Susceptibility-sensitive sequences show no chronic microhemorrhage or superficial siderosis. VASCULAR: Major intracranial arterial and venous sinus  flow voids are normal. SKULL AND UPPER CERVICAL SPINE: Remote left pterional craniotomy. Calvarial bone marrow signal is normal. There is no skull base mass. Visualized upper cervical spine and soft tissues are normal. SINUSES/ORBITS: Small amount of fluid within both mastoids. Mild sphenoid sinus mucosal thickening. The orbits are normal. IMPRESSION: 1. No acute intracranial abnormality. 2. Chronic microvascular ischemic changes of the white matter. Electronically Signed   By: Ulyses Jarred M.D.   On: 05/07/2018 23:37     Assessment/Plan: ESRD R native knee septic arthritis  Total days of antibiotics: 4 vanco       Continue vanco with HD Await sensi of staph aureus.  Explained to pt, she is curious as to where the infection came from. I am unable to give her a clear answer regarding this.     Bobby Rumpf MD, FACP Infectious Diseases (pager) [phone] [email or web address] 05/09/2018, 10:39 AM  LOS: 3 days

## 2018-05-09 NOTE — Consult Note (Signed)
   [facility] CM Inpatient Consult   05/09/2018  Destiny Day 03-Dec-1950 [id number]  Patient screened for extreme high risk score for unplanned readmission in [place] with Medicare. Chart review reveals patient is a 67 year old female with a previous right transmetatarsal amputation who presents with new acute onset of right knee pain. Patient with ESRD on HD MWF at [facility] Admitted with Degenerative tear of posterior horn of right medial meniscus.   Old complex tear of lateral meniscus of right knee.   Loose body in knee, right knee   Septic arthritis of knee, right  center.  According to chart review and after speaking with inpatient RNCM patient is being recommended for a short term skilled nursing facility stay for rehab.  For questions contact:   Natividad Brood, RN BSN [facility] Liaison  [phone] business mobile phone Toll free office [phone]

## 2018-05-09 NOTE — NC FL2 (Signed)
[place] MEDICAID FL2 LEVEL OF CARE SCREENING TOOL     IDENTIFICATION  Patient Name: Destiny Day Birthdate: [birth year] Sex: female Admission Date (Current Location): 05/06/2018  [facility] and [place] Number:  Herbalist and Address:  [facility] [place], [place] [address], [place], [place] [address]      Provider Number: [id number]  Attending Physician Name and Address:  Newt Minion, MD  Relative Name and Phone Number:  [name] (spouse) [phone]    Current Level of Care: Hospital Recommended Level of Care: [place] Prior Approval Number:    Date Approved/Denied:   PASRR Number: [id number]  Discharge Plan: SNF    Current Diagnoses: Patient Active Problem List   Diagnosis Date Noted  . Septic arthritis of knee, right ([place]) 05/06/2018  . Degenerative tear of posterior horn of medial meniscus, right   . Old complex tear of lateral meniscus of right knee   . Loose body in knee, right knee   . Acute pulmonary edema ([place]) 02/21/2018  . 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[place]) 02/03/2018  . End-stage renal disease on hemodialysis ([place])   . Respiratory failure ([place]) 01/21/2018  . Achilles tendon contracture, right 11/09/2017  . Labile blood glucose   . Anemia of infection and chronic disease   . Essential hypertension   . Black stool   . Right sided weakness   . Subdural hemorrhage following injury ([place]) 08/30/2017  . Diabetic peripheral neuropathy ([place])   . Chronic obstructive pulmonary disease ([place])   . Seizure prophylaxis   . Subdural hematoma ([place]) 08/24/2017  . Traumatic subdural hematoma ([place]) 08/23/2017  . Fall   . SDH (subdural hematoma) ([place])   . Acute respiratory failure with hypoxia ([place])   . Diabetes mellitus type 2 in nonobese (HCC)   . Leukocytosis   . Labile blood pressure   . Generalized anxiety disorder   . Debility 08/16/2017  . Amputated great toe of right foot ([place])   .  Amputated toe of left foot ([place])   . Post-operative pain   . ESRD on dialysis ([place])   . Diabetes mellitus ([place])   . Anxiety state   . Benign essential HTN   . Acute blood loss anemia   . Anemia of chronic disease   . Idiopathic chronic venous hypertension of both lower extremities with inflammation 10/13/2016  . S/P transmetatarsal amputation of foot, left ([place]) 09/21/2016  . Onychomycosis 08/15/2016  . PAD (peripheral artery disease) ([place]) 07/08/2016  . Atherosclerosis of native artery of left lower extremity with gangrene ([place]) 06/23/2016  . GERD (gastroesophageal reflux disease) 10/07/2015  . ARF (acute renal failure) ([place])   . Hypothermia 06/12/2015  . Acute on chronic renal failure ([place]) 06/12/2015  . CHF (congestive heart failure) ([place]) 07/30/2014  . CKD (chronic kidney disease) stage 4, GFR 15-29 ml/min (HCC) 06/27/2014  . Hypertensive heart disease 05/25/2013  . CKD (chronic kidney disease) stage 3, GFR 30-59 ml/min (HCC) 05/25/2013  . Pulmonary edema 05/23/2013  . Type 2 diabetes mellitus with diabetic nephropathy ([place]) 05/23/2013  . Type 2 diabetes mellitus with hyperosmolar nonketotic hyperglycemia ([place]) 04/13/2013  . Chronic diastolic CHF (congestive heart failure) ([place]) 04/13/2013  . UGI bleed 03/31/2013  . Hypokalemia 08/24/2012  . Type II or unspecified type diabetes mellitus without mention of complication, not stated as uncontrolled 12/27/2007  . HLD (hyperlipidemia) 12/27/2007  . ALLERGIC RHINITIS 12/27/2007  . Asthma 12/27/2007    Orientation RESPIRATION BLADDER Height &  Weight     Self, Time, Situation, Place  Normal Continent Weight: 138 lb 3.7 oz (62.7 kg) Height:  5\' 5"  (165.1 cm)  BEHAVIORAL SYMPTOMS/MOOD NEUROLOGICAL BOWEL NUTRITION STATUS      Continent Diet(see discharge summary)  AMBULATORY STATUS COMMUNICATION OF NEEDS Skin   Limited Assist Verbally Surgical wounds(closed surgical incision right knee)                       Personal Care  Assistance Level of Assistance  Bathing, Feeding, Dressing, Total care Bathing Assistance: Limited assistance Feeding assistance: Independent Dressing Assistance: Limited assistance Total Care Assistance: Limited assistance   Functional Limitations Info  Sight, Hearing, Speech Sight Info: Adequate Hearing Info: Adequate Speech Info: Adequate    SPECIAL CARE FACTORS FREQUENCY  PT (By licensed PT), OT (By licensed OT)     PT Frequency: 5x weekly OT Frequency: 5x weekly            Contractures Contractures Info: Not present    Additional Factors Info  Allergies   Allergies Info: Allergies:  Prednisone, Lisinopril           Current Medications (05/09/2018):  This is the current hospital active medication list Current Facility-Administered Medications  Medication Dose Route Frequency Provider Last Rate Last Dose  . 0.9 %  sodium chloride infusion   Intravenous Continuous Newt Minion, MD   Stopped at 05/06/18 1332  . acetaminophen (TYLENOL) tablet 325-650 mg  325-650 mg Oral Q6H PRN Newt Minion, MD   650 mg at 05/08/18 1803  . albuterol (PROVENTIL) (2.5 MG/3ML) 0.083% nebulizer solution 2.5 mg  2.5 mg Inhalation Q6H PRN Newt Minion, MD      . aspirin EC tablet 81 mg  81 mg Oral Daily Newt Minion, MD   81 mg at 05/08/18 1049  . atorvastatin (LIPITOR) tablet 20 mg  20 mg Oral QHS Newt Minion, MD   20 mg at 05/08/18 2136  . bisacodyl (DULCOLAX) suppository 10 mg  10 mg Rectal Daily PRN Newt Minion, MD      . carvedilol (COREG) tablet 12.5 mg  12.5 mg Oral BID WC Newt Minion, MD   12.5 mg at 05/08/18 1758  . cinacalcet (SENSIPAR) tablet 60 mg  60 mg Oral Q supper Newt Minion, MD   60 mg at 05/08/18 1758  . clopidogrel (PLAVIX) tablet 75 mg  75 mg Oral Daily Newt Minion, MD   75 mg at 05/08/18 1049  . Darbepoetin Alfa (ARANESP) injection 40 mcg  40 mcg Intravenous Q Fri-HD Ernest Haber, PA-C   40 mcg at 05/06/18 1648  . diazepam (VALIUM) tablet 5 mg  5  mg Oral BID PRN Newt Minion, MD   5 mg at 05/08/18 1054  . docusate sodium (COLACE) capsule 100 mg  100 mg Oral BID Newt Minion, MD   100 mg at 05/09/18 0836  . doxercalciferol (HECTOROL) injection 3 mcg  3 mcg Intravenous Q M,W,F-HD Zeyfang, David, PA-C      . ferric citrate (AURYXIA) tablet 420 mg  420 mg Oral TID WC Ernest Haber, PA-C   420 mg at 05/09/18 1359  . fluticasone (FLONASE) 50 MCG/ACT nasal spray 1 spray  1 spray Each Nare Daily PRN Newt Minion, MD      . gabapentin (NEURONTIN) capsule 100 mg  100 mg Oral QHS Newt Minion, MD   100 mg at 05/08/18 2137  . guaiFENesin-dextromethorphan (  ROBITUSSIN DM) 100-10 MG/5ML syrup 5 mL  5 mL Oral Q4H PRN Newt Minion, MD   5 mL at 05/06/18 2153  . insulin aspart (novoLOG) injection 0-9 Units  0-9 Units Subcutaneous TID WC Newt Minion, MD   1 Units at 05/09/18 1248  . insulin aspart (novoLOG) injection 3 Units  3 Units Subcutaneous TID WC Newt Minion, MD   3 Units at 05/09/18 1248  . insulin glargine (LANTUS) injection 10 Units  10 Units Subcutaneous QHS Newt Minion, MD   10 Units at 05/08/18 2315  . isosorbide-hydrALAZINE (BIDIL) 20-37.5 MG per tablet 1 tablet  1 tablet Oral TID Newt Minion, MD   1 tablet at 05/08/18 2136  . metoCLOPramide (REGLAN) tablet 5-10 mg  5-10 mg Oral Q8H PRN Newt Minion, MD       Or  . metoCLOPramide (REGLAN) injection 5-10 mg  5-10 mg Intravenous Q8H PRN Newt Minion, MD   10 mg at 05/07/18 1418  . mometasone-formoterol (DULERA) 200-5 MCG/ACT inhaler 2 puff  2 puff Inhalation BID Newt Minion, MD   2 puff at 05/09/18 0907  . multivitamin (RENA-VIT) tablet 1 tablet  1 tablet Oral Daily Newt Minion, MD   1 tablet at 05/08/18 1049  . ondansetron (ZOFRAN) tablet 4 mg  4 mg Oral Q6H PRN Newt Minion, MD       Or  . ondansetron [facility]) injection 4 mg  4 mg Intravenous Q6H PRN Newt Minion, MD   4 mg at 05/07/18 0844  . pantoprazole (PROTONIX) EC tablet 40 mg  40 mg Oral Daily Newt Minion, MD   40 mg at 05/09/18 0836  . polyethylene glycol (MIRALAX / GLYCOLAX) packet 17 g  17 g Oral Daily PRN Newt Minion, MD      . vancomycin (VANCOCIN) IVPB 500 mg/100 ml premix  500 mg Intravenous Q M,W,F-HD Newt Minion, MD         Discharge Medications: Please see discharge summary for a list of discharge medications.  Relevant Imaging Results:  Relevant Lab Results:   Additional Information SSN: [id number]  Alberteen Sam, LCSW

## 2018-05-09 NOTE — Plan of Care (Signed)
  Problem: Activity: Goal: Risk for activity intolerance will decrease Outcome: Progressing   Problem: Coping: Goal: Level of anxiety will decrease Outcome: Progressing   Problem: Pain Managment: Goal: General experience of comfort will improve Outcome: Progressing   Problem: Safety: Goal: Ability to remain free from injury will improve Outcome: Progressing   

## 2018-05-09 NOTE — Plan of Care (Signed)

## 2018-05-10 DIAGNOSIS — J309 Allergic rhinitis, unspecified: Secondary | ICD-10-CM | POA: Diagnosis not present

## 2018-05-10 DIAGNOSIS — N183 Chronic kidney disease, stage 3 (moderate): Secondary | ICD-10-CM | POA: Diagnosis not present

## 2018-05-10 DIAGNOSIS — Z7901 Long term (current) use of anticoagulants: Secondary | ICD-10-CM | POA: Diagnosis not present

## 2018-05-10 DIAGNOSIS — I10 Essential (primary) hypertension: Secondary | ICD-10-CM | POA: Diagnosis not present

## 2018-05-10 DIAGNOSIS — E785 Hyperlipidemia, unspecified: Secondary | ICD-10-CM | POA: Diagnosis not present

## 2018-05-10 DIAGNOSIS — I428 Other cardiomyopathies: Secondary | ICD-10-CM | POA: Diagnosis not present

## 2018-05-10 DIAGNOSIS — D631 Anemia in chronic kidney disease: Secondary | ICD-10-CM | POA: Diagnosis not present

## 2018-05-10 DIAGNOSIS — M00861 Arthritis due to other bacteria, right knee: Secondary | ICD-10-CM | POA: Diagnosis not present

## 2018-05-10 DIAGNOSIS — B9561 Methicillin susceptible Staphylococcus aureus infection as the cause of diseases classified elsewhere: Secondary | ICD-10-CM | POA: Diagnosis not present

## 2018-05-10 DIAGNOSIS — Z471 Aftercare following joint replacement surgery: Secondary | ICD-10-CM | POA: Diagnosis not present

## 2018-05-10 DIAGNOSIS — Z992 Dependence on renal dialysis: Secondary | ICD-10-CM | POA: Diagnosis not present

## 2018-05-10 DIAGNOSIS — H2513 Age-related nuclear cataract, bilateral: Secondary | ICD-10-CM | POA: Diagnosis not present

## 2018-05-10 DIAGNOSIS — E876 Hypokalemia: Secondary | ICD-10-CM | POA: Diagnosis not present

## 2018-05-10 DIAGNOSIS — E119 Type 2 diabetes mellitus without complications: Secondary | ICD-10-CM | POA: Diagnosis not present

## 2018-05-10 DIAGNOSIS — M792 Neuralgia and neuritis, unspecified: Secondary | ICD-10-CM | POA: Diagnosis not present

## 2018-05-10 DIAGNOSIS — N185 Chronic kidney disease, stage 5: Secondary | ICD-10-CM | POA: Diagnosis not present

## 2018-05-10 DIAGNOSIS — E1121 Type 2 diabetes mellitus with diabetic nephropathy: Secondary | ICD-10-CM | POA: Diagnosis not present

## 2018-05-10 DIAGNOSIS — K219 Gastro-esophageal reflux disease without esophagitis: Secondary | ICD-10-CM | POA: Diagnosis not present

## 2018-05-10 DIAGNOSIS — M25561 Pain in right knee: Secondary | ICD-10-CM | POA: Diagnosis not present

## 2018-05-10 DIAGNOSIS — Z4789 Encounter for other orthopedic aftercare: Secondary | ICD-10-CM | POA: Diagnosis not present

## 2018-05-10 DIAGNOSIS — K5900 Constipation, unspecified: Secondary | ICD-10-CM | POA: Diagnosis not present

## 2018-05-10 DIAGNOSIS — I7389 Other specified peripheral vascular diseases: Secondary | ICD-10-CM | POA: Diagnosis not present

## 2018-05-10 DIAGNOSIS — Z888 Allergy status to other drugs, medicaments and biological substances status: Secondary | ICD-10-CM

## 2018-05-10 DIAGNOSIS — B9689 Other specified bacterial agents as the cause of diseases classified elsewhere: Secondary | ICD-10-CM | POA: Diagnosis not present

## 2018-05-10 DIAGNOSIS — R062 Wheezing: Secondary | ICD-10-CM | POA: Diagnosis not present

## 2018-05-10 DIAGNOSIS — N2581 Secondary hyperparathyroidism of renal origin: Secondary | ICD-10-CM | POA: Diagnosis not present

## 2018-05-10 DIAGNOSIS — E1122 Type 2 diabetes mellitus with diabetic chronic kidney disease: Secondary | ICD-10-CM | POA: Diagnosis not present

## 2018-05-10 DIAGNOSIS — M0000 Staphylococcal arthritis, unspecified joint: Secondary | ICD-10-CM | POA: Diagnosis not present

## 2018-05-10 DIAGNOSIS — N186 End stage renal disease: Secondary | ICD-10-CM | POA: Diagnosis not present

## 2018-05-10 DIAGNOSIS — R0602 Shortness of breath: Secondary | ICD-10-CM | POA: Diagnosis not present

## 2018-05-10 DIAGNOSIS — Z7401 Bed confinement status: Secondary | ICD-10-CM | POA: Diagnosis not present

## 2018-05-10 DIAGNOSIS — M6281 Muscle weakness (generalized): Secondary | ICD-10-CM | POA: Diagnosis not present

## 2018-05-10 DIAGNOSIS — F419 Anxiety disorder, unspecified: Secondary | ICD-10-CM | POA: Diagnosis not present

## 2018-05-10 DIAGNOSIS — R278 Other lack of coordination: Secondary | ICD-10-CM | POA: Diagnosis not present

## 2018-05-10 DIAGNOSIS — I159 Secondary hypertension, unspecified: Secondary | ICD-10-CM | POA: Diagnosis not present

## 2018-05-10 DIAGNOSIS — Z96651 Presence of right artificial knee joint: Secondary | ICD-10-CM | POA: Diagnosis not present

## 2018-05-10 DIAGNOSIS — M00061 Staphylococcal arthritis, right knee: Secondary | ICD-10-CM | POA: Diagnosis not present

## 2018-05-10 DIAGNOSIS — M2550 Pain in unspecified joint: Secondary | ICD-10-CM | POA: Diagnosis not present

## 2018-05-10 DIAGNOSIS — M23251 Derangement of posterior horn of lateral meniscus due to old tear or injury, right knee: Secondary | ICD-10-CM | POA: Diagnosis not present

## 2018-05-10 DIAGNOSIS — H40033 Anatomical narrow angle, bilateral: Secondary | ICD-10-CM | POA: Diagnosis not present

## 2018-05-10 DIAGNOSIS — I739 Peripheral vascular disease, unspecified: Secondary | ICD-10-CM | POA: Diagnosis not present

## 2018-05-10 DIAGNOSIS — J449 Chronic obstructive pulmonary disease, unspecified: Secondary | ICD-10-CM | POA: Diagnosis not present

## 2018-05-10 LAB — GLUCOSE, CAPILLARY
Glucose-Capillary: 118 mg/dL — ABNORMAL HIGH (ref 70–99)
Glucose-Capillary: 218 mg/dL — ABNORMAL HIGH (ref 70–99)
Glucose-Capillary: 115 mg/dL — ABNORMAL HIGH (ref 70–99)

## 2018-05-10 MED ORDER — CEFAZOLIN IV (FOR PTA / DISCHARGE USE ONLY): 2.0000 g | INTRAVENOUS | 0 refills | Status: AC

## 2018-05-10 MED ORDER — CEFAZOLIN SODIUM-DEXTROSE 2-4 GM/100ML-% IV SOLN: 2.0000 g | INTRAVENOUS | Status: DC

## 2018-05-10 MED ORDER — OXYCODONE-ACETAMINOPHEN 5-325 MG PO TABS: 1.0000 | ORAL_TABLET | ORAL | 0 refills | Status: DC | PRN

## 2018-05-10 MED ORDER — VANCOMYCIN HCL IN DEXTROSE 500-5 MG/100ML-% IV SOLN: 500.0000 mg | INTRAVENOUS | 41 refills | Status: DC

## 2018-05-10 NOTE — Discharge Summary (Signed)
Discharge Diagnoses:  Active Problems:   Degenerative tear of posterior horn of medial meniscus, right   Old complex tear of lateral meniscus of right knee   Loose body in knee, right knee   Septic arthritis of knee, right ([place])   Surgeries: Procedure(s): RIGHT KNEE ARTHROSCOPY on 05/06/2018    Consultants: Treatment Team:  Roney Jaffe, MD Annita Brod, MD  Discharged Condition: Improved  Hospital Course: Destiny Day is an 67 y.o. female who was admitted 05/06/2018 with a chief complaint of septic right knee, with a final diagnosis of Septic Right Knee.  Patient was brought to the operating room on 05/06/2018 and underwent Procedure(s): RIGHT KNEE ARTHROSCOPY.    Patient was given perioperative antibiotics:  Anti-infectives (From admission, onward)   Start     Dose/Rate Route Frequency Ordered Stop   05/11/18 0000  vancomycin (VANCOCIN) 500-5 MG/100ML-% IVPB  Status:  Discontinued     500 mg 100 mL/hr over 60 Minutes Intravenous Every M-W-F (Hemodialysis) 05/10/18 0705 05/10/18    05/09/18 2000  cefTAZidime (FORTAZ) 2 g in sodium chloride 0.9 % 100 mL IVPB  Status:  Discontinued     2 g 200 mL/hr over 30 Minutes Intravenous Every M-W-F (Hemodialysis) 05/06/18 1823 05/08/18 1147   05/09/18 1200  vancomycin (VANCOCIN) IVPB 500 mg/100 ml premix     500 mg 100 mL/hr over 60 Minutes Intravenous Every M-W-F (Hemodialysis) 05/06/18 1827     05/06/18 2000  cefTAZidime (FORTAZ) 2 g in sodium chloride 0.9 % 100 mL IVPB     2 g 200 mL/hr over 30 Minutes Intravenous  Once 05/06/18 1834 05/06/18 2219   05/06/18 1330  vancomycin (VANCOCIN) 1,500 mg in sodium chloride 0.9 % 500 mL IVPB     1,500 mg 250 mL/hr over 120 Minutes Intravenous To Hemodialysis 05/06/18 1231 05/06/18 1913   05/06/18 1230  piperacillin-tazobactam (ZOSYN) IVPB 3.375 g  Status:  Discontinued     3.375 g 12.5 mL/hr over 240 Minutes Intravenous Every 12 hours 05/06/18 1132 05/06/18 1746   05/06/18 1215   vancomycin (VANCOCIN) 1,500 mg in sodium chloride 0.9 % 500 mL IVPB  Status:  Discontinued     1,500 mg 250 mL/hr over 120 Minutes Intravenous  Once 05/06/18 1132 05/06/18 1839    .  Patient was given sequential compression devices, early ambulation, and aspirin for DVT prophylaxis.  Recent vital signs:  Patient Vitals for the past 24 hrs:  BP Temp Temp src Pulse Resp SpO2 Weight  05/10/18 0218 (!) 137/49 98.2 F (36.8 C) Oral 92 16 100 % -  05/09/18 1855 (!) 111/51 (!) 97 F (36.1 C) Oral 97 16 100 % 62.2 kg  05/09/18 1800 (!) 121/55 - - 97 18 - -  05/09/18 1730 (!) 138/57 - - (!) 102 18 - -  05/09/18 1700 (!) 129/42 - - (!) 104 18 - -  05/09/18 1630 (!) 167/72 - - 99 18 - -  05/09/18 1600 (!) 144/84 - - 90 18 - -  05/09/18 1530 (!) 157/77 - - 90 16 - -  05/09/18 1500 (!) 144/69 - - 96 16 - -  05/09/18 1443 (!) 174/81 - - 90 16 - -  05/09/18 1434 (!) 173/79 97.8 F (36.6 C) Oral 89 16 100 % 64.5 kg  05/09/18 1334 (!) 128/115 98.7 F (37.1 C) Oral 89 16 100 % -  05/09/18 0907 - - - - - 95 % -  05/09/18 0849 137/64 97.8 F (36.6 C)  Oral 85 - 100 % -  .  Recent laboratory studies: No results found.  Discharge Medications:   Allergies as of 05/10/2018      Reactions   Prednisone Swelling, Other (See Comments)   Excessive fluid buildup   Lisinopril Cough      Medication List    STOP taking these medications   feeding supplement (PRO-STAT SUGAR FREE 64) Liqd   LEVEMIR FLEXTOUCH 100 UNIT/ML Pen Generic drug:  Insulin Detemir   sodium hypochlorite external solution     TAKE these medications   albuterol 108 (90 Base) MCG/ACT inhaler Commonly known as:  PROVENTIL HFA;VENTOLIN HFA Inhale 1-2 puffs into the lungs every 6 (six) hours as needed for wheezing or shortness of breath.   aspirin EC 81 MG tablet Take 81 mg by mouth daily.   atorvastatin 20 MG tablet Commonly known as:  LIPITOR Take 20 mg by mouth at bedtime.   budesonide-formoterol 160-4.5 MCG/ACT  inhaler Commonly known as:  SYMBICORT Inhale 1 puff into the lungs daily as needed (for respiratory issues).   carvedilol 12.5 MG tablet Commonly known as:  COREG Take 12.5 mg by mouth 2 (two) times daily with a meal.   cinacalcet 60 MG tablet Commonly known as:  SENSIPAR Take 60 mg by mouth daily. Pt. Gets at dialysis   clopidogrel 75 MG tablet Commonly known as:  PLAVIX Take 75 mg by mouth daily.   diazepam 5 MG tablet Commonly known as:  VALIUM Take 5 mg by mouth 2 (two) times daily as needed for anxiety.   fluticasone 50 MCG/ACT nasal spray Commonly known as:  FLONASE Place 1 spray into both nostrils daily as needed for allergies or rhinitis.   gabapentin 100 MG capsule Commonly known as:  NEURONTIN TAKE 1 CAPSULE (100 MG TOTAL) BY MOUTH AT BEDTIME. WHEN NECESSARY FOR NEUROPATHY PAIN   hydrocerin Crea Apply 322 application topically 2 (two) times daily. What changed:  how much to take   insulin NPH Human 100 UNIT/ML injection Commonly known as:  HUMULIN N,NOVOLIN N Inject 0.1 mLs (10 Units total) into the skin at bedtime.   insulin regular 100 units/mL injection Commonly known as:  NOVOLIN R,HUMULIN R Inject 3-8 Units into the skin 3 (three) times daily before meals. Per sliding scale on non-dialysis days   isosorbide-hydrALAZINE 20-37.5 MG tablet Commonly known as:  BIDIL Take 1 tablet by mouth 3 (three) times daily.   lidocaine (PF) 1 % Soln injection Commonly known as:  XYLOCAINE Inject 5 mLs into the skin as needed (topical anesthesia for hemodialysis ifGEBAUERS is ineffective.).   lidocaine-prilocaine cream Commonly known as:  EMLA Apply 1 application topically 3 (three) times a week. 1-2 hours prior to dialysis   loratadine 10 MG tablet Commonly known as:  CLARITIN Take 10 mg by mouth daily as needed for allergies.   multivitamin Tabs tablet Take 1 tablet by mouth daily.   omeprazole 20 MG capsule Commonly known as:  PRILOSEC Take 20 mg by mouth  daily.   oxyCODONE-acetaminophen 5-325 MG tablet Commonly known as:  PERCOCET/ROXICET Take 1 tablet by mouth every 4 (four) hours as needed for severe pain.   polyethylene glycol packet Commonly known as:  MIRALAX / GLYCOLAX Take 17 g by mouth daily. What changed:    when to take this  reasons to take this       Diagnostic Studies: Ct Head Wo Contrast  Result Date: 05/07/2018 CLINICAL DATA:  Encephalopathy. EXAM: CT HEAD WITHOUT CONTRAST TECHNIQUE: Contiguous  axial images were obtained from the base of the skull through the vertex without intravenous contrast. COMPARISON:  Head CT dated 09/28/2017. FINDINGS: Brain: Low-density areas are now present within the bilateral periventricular and subcortical white matter regions, more prominent than on previous studies. There is no parenchymal or extra-axial hemorrhage appreciated on today's exam. The recent LEFT-sided subdural hematoma has resolved. Vascular: Chronic calcified atherosclerotic changes of the large vessels at the skull base. No unexpected hyperdense vessel. Skull: No acute findings. LEFT frontoparietal craniotomy appears well aligned. Sinuses/Orbits: No acute finding. Other: None. IMPRESSION: 1. Low-density within the periventricular white matter regions bilaterally. This may represent typical chronic small vessel ischemic change, however, it is more prominently seen on today's study which may indicate superimposed edema. Given the history of encephalopathy, would consider brain MRI for further characterization. 2. No intracranial hemorrhage. LEFT-sided subdural hematoma has resolved. Electronically Signed   By: Franki Cabot M.D.   On: 05/07/2018 11:47   Mr Brain Wo Contrast  Result Date: 05/07/2018 CLINICAL DATA:  Encephalopathy. EXAM: MRI HEAD WITHOUT CONTRAST TECHNIQUE: Multiplanar, multiecho pulse sequences of the brain and surrounding structures were obtained without intravenous contrast. COMPARISON:  Head CT 05/07/2018  FINDINGS: BRAIN: There is no acute infarct, acute hemorrhage, hydrocephalus or extra-axial collection. The midline structures are normal. No midline shift or other mass effect. There are no old infarcts. Diffuse confluent hyperintense T2-weighted signal within the periventricular, deep and juxtacortical white matter, most commonly due to chronic ischemic microangiopathy. Generalized atrophy without lobar predilection. Susceptibility-sensitive sequences show no chronic microhemorrhage or superficial siderosis. VASCULAR: Major intracranial arterial and venous sinus flow voids are normal. SKULL AND UPPER CERVICAL SPINE: Remote left pterional craniotomy. Calvarial bone marrow signal is normal. There is no skull base mass. Visualized upper cervical spine and soft tissues are normal. SINUSES/ORBITS: Small amount of fluid within both mastoids. Mild sphenoid sinus mucosal thickening. The orbits are normal. IMPRESSION: 1. No acute intracranial abnormality. 2. Chronic microvascular ischemic changes of the white matter. Electronically Signed   By: Ulyses Jarred M.D.   On: 05/07/2018 23:37   Xr Knee 1-2 Views Right  Result Date: 05/05/2018 Radiographs of the right knee show tricompartmental degenerative changes with loose osteophyte in the posterior knee as well as osteophytes over the patella.  Calcified vessels noted as well.  No evidence for fracture no evidence for lytic lesions.  Radiographs were reviewed with Dr. Sharol Given.   Patient benefited maximally from their hospital stay and there were no complications.     Disposition: Discharge disposition: 03-Skilled Nursing Facility      Discharge Instructions    Call MD / Call 911   Complete by:  As directed    If you experience chest pain or shortness of breath, CALL 911 and be transported to the hospital emergency room.  If you develope a fever above 101 F, pus (white drainage) or increased drainage or redness at the wound, or calf pain, call your surgeon's  office.   Call MD / Call 911   Complete by:  As directed    If you experience chest pain or shortness of breath, CALL 911 and be transported to the hospital emergency room.  If you develope a fever above 101 F, pus (white drainage) or increased drainage or redness at the wound, or calf pain, call your surgeon's office.   Call MD / Call 911   Complete by:  As directed    If you experience chest pain or shortness of breath, CALL 911 and  be transported to the hospital emergency room.  If you develope a fever above 101 F, pus (white drainage) or increased drainage or redness at the wound, or calf pain, call your surgeon's office.   Constipation Prevention   Complete by:  As directed    Drink plenty of fluids.  Prune juice may be helpful.  You may use a stool softener, such as Colace (over the counter) 100 mg twice a day.  Use MiraLax (over the counter) for constipation as needed.   Constipation Prevention   Complete by:  As directed    Drink plenty of fluids.  Prune juice may be helpful.  You may use a stool softener, such as Colace (over the counter) 100 mg twice a day.  Use MiraLax (over the counter) for constipation as needed.   Constipation Prevention   Complete by:  As directed    Drink plenty of fluids.  Prune juice may be helpful.  You may use a stool softener, such as Colace (over the counter) 100 mg twice a day.  Use MiraLax (over the counter) for constipation as needed.   Diet - low sodium heart healthy   Complete by:  As directed    Diet - low sodium heart healthy   Complete by:  As directed    Diet - low sodium heart healthy   Complete by:  As directed    Increase activity slowly as tolerated   Complete by:  As directed    Increase activity slowly as tolerated   Complete by:  As directed    Increase activity slowly as tolerated   Complete by:  As directed      Follow-up Information    Newt Minion, MD In 1 week.   Specialty:  Orthopedic Surgery Contact information: [place] [address] [phone]            Signed: Newt Minion 05/10/2018, 7:20 AM

## 2018-05-10 NOTE — Care Management Important Message (Signed)
Important Message  Patient Details  Name: Destiny Day MRN: [id number] Date of Birth: [birth year]   Medicare Important Message Given:  Yes    Dawna Jakes Montine Circle 05/10/2018, 3:46 PM

## 2018-05-10 NOTE — Progress Notes (Signed)
PHARMACY CONSULT NOTE FOR:  OUTPATIENT  PARENTERAL ANTIBIOTIC THERAPY (OPAT)  Indication: Right knee septic arthritis Regimen: Cefazolin 2 gm Q HD MWF End date: 06/03/18  IV antibiotic discharge orders are pended. To discharging provider:  please sign these orders via discharge navigator,  Select New Orders & click on the button choice - Manage This Unsigned Work.     Thank you for allowing pharmacy to be a part of this patient's care.  Susa Raring, PharmD, BCPS Infectious Diseases Clinical Pharmacist Phone: [phone] 05/10/2018, 11:56 AM

## 2018-05-10 NOTE — Progress Notes (Signed)
Destiny Day for Infectious Disease  Date of Admission:  05/06/2018     Total days of antibiotics  5         ASSESSMENT/PLAN  [name] continues to improve following right knee arthroscopy on 05/06/18 for septic arthritis. Cultures and sensitivities show MSSA infection. Currently receiving vancomycin and on Day 5 of therapy. Plan for discharge to SNF.  1. Discontinue vancomycin and start cefazolin. Goal of 4 weeks of treatment for septic arthritis. 2. Follow up in the ID office in 4 weeks or sooner if needed.   Diagnosis:  MSSA Septic Arthritis of the right knee.   Culture Result: MSSA  Allergies  Allergen Reactions  . Prednisone Swelling and Other (See Comments)    Excessive fluid buildup  . Lisinopril Cough    OPAT Orders Discharge antibiotics: Cefazolin with dialysis Per pharmacy protocol  Duration:  4 weeks   End Date:  06/03/18   [facility] Care Per Protocol:  Labs weekly while on IV antibiotics:  _X_ CBC with differential __ BMP __ CMP _X_ CRP _X_ ESR __ Vancomycin trough __ CK   Fax weekly labs to [phone]  Clinic Follow Up Appt:  4 weeks with Dr. Johnnye Sima or Terri Piedra, NP   Active Problems:   Degenerative tear of posterior horn of medial meniscus, right   Old complex tear of lateral meniscus of right knee   Loose body in knee, right knee   Septic arthritis of knee, right ([place])   . aspirin EC  81 mg Oral Daily  . atorvastatin  20 mg Oral QHS  . carvedilol  12.5 mg Oral BID WC  . cinacalcet  60 mg Oral Q supper  . clopidogrel  75 mg Oral Daily  . darbepoetin (ARANESP) injection - DIALYSIS  40 mcg Intravenous Q Fri-HD  . docusate sodium  100 mg Oral BID  . doxercalciferol  3 mcg Intravenous Q M,W,F-HD  . ferric citrate  420 mg Oral TID WC  . gabapentin  100 mg Oral QHS  . insulin aspart  0-9 Units Subcutaneous TID WC  . insulin aspart  3 Units Subcutaneous TID WC  . insulin glargine  10 Units Subcutaneous QHS  .  isosorbide-hydrALAZINE  1 tablet Oral TID  . mometasone-formoterol  2 puff Inhalation BID  . multivitamin  1 tablet Oral Daily  . pantoprazole  40 mg Oral Daily  . vancomycin  500 mg Intravenous Q M,W,F-HD    SUBJECTIVE:  Afebrile overnight with stable leukocytes. Cultures reveal MSSA. Orthopedics planning for discharge to SNF.   Knee is improved and continues to feel sore.   Allergies  Allergen Reactions  . Prednisone Swelling and Other (See Comments)    Excessive fluid buildup  . Lisinopril Cough     Review of Systems: Review of Systems  Constitutional: Negative for chills and fever.  Respiratory: Negative for cough and shortness of breath.   Cardiovascular: Negative for chest pain and leg swelling.  Gastrointestinal: Negative for constipation, diarrhea, nausea and vomiting.  Skin: Negative for rash.      OBJECTIVE: Vitals:   05/09/18 1800 05/09/18 1855 05/10/18 0218 05/10/18 0900  BP: (!) 121/55 (!) 111/51 (!) 137/49 (!) 129/50  Pulse: 97 97 92 85  Resp: '18 16 16   ' Temp:  (!) 97 F (36.1 C) 98.2 F (36.8 C)   TempSrc:  Oral Oral   SpO2:  100% 100%   Weight:  62.2 kg    Height:  Body mass index is 22.82 kg/m.  Physical Exam  Constitutional: She is oriented to person, place, and time. She appears well-developed and well-nourished. No distress.  Cardiovascular: Normal rate, regular rhythm, normal heart sounds and intact distal pulses.  Pulmonary/Chest: Effort normal and breath sounds normal.  Musculoskeletal:  Right knee appears with surgical dressing that is clean and dry. Circulation and sensation are normal.   Neurological: She is alert and oriented to person, place, and time.  Skin: Skin is warm and dry.  Psychiatric: She has a normal mood and affect.    Lab Results Lab Results  Component Value Date   WBC 13.5 (H) 05/09/2018   HGB 8.4 (L) 05/09/2018   HCT 27.8 (L) 05/09/2018   MCV 95.5 05/09/2018   PLT 459 (H) 05/09/2018    Lab Results    Component Value Date   CREATININE 7.82 (H) 05/09/2018   BUN 50 (H) 05/09/2018   NA 131 (L) 05/09/2018   K 4.6 05/09/2018   CL 92 (L) 05/09/2018   CO2 22 05/09/2018    Lab Results  Component Value Date   ALT 14 05/07/2018   AST 23 05/07/2018   ALKPHOS 134 (H) 05/07/2018   BILITOT 0.5 05/07/2018     Microbiology: Recent Results (from the past 240 hour(s))  Anaerobic and Aerobic Culture     Status: Abnormal (Preliminary result)   Collection Time: 05/05/18  2:07 PM  Result Value Ref Range Status   MICRO NUMBER: [id number]  Preliminary   SPECIMEN QUALITY: ADEQUATE  Preliminary   Source: NOT GIVEN  Preliminary   STATUS: PRELIMINARY  Preliminary   GRAM STAIN:   Preliminary    Many Polymorphonuclear leukocytes No organisms seen   MICRO NUMBER: [id number]  Preliminary   SPECIMEN QUALITY: ADEQUATE  Preliminary   SOURCE: NOT GIVEN  Preliminary   STATUS: PRELIMINARY  Preliminary   AER ISOLATE 1: Staphylococcus aureus (A)  Preliminary    Comment: Moderate growth of Staphylococcus aureus , susceptibility test report to follow.  Aerobic/Anaerobic Culture (surgical/deep wound)     Status: None (Preliminary result)   Collection Time: 05/06/18  9:18 AM  Result Value Ref Range Status   Specimen Description KNEE RIGTH ABSCESS PUS  Final   Special Requests NONE  Final   Gram Stain   Final    MODERATE WBC PRESENT, PREDOMINANTLY PMN NO ORGANISMS SEEN Performed at [facility], [address] [place], [place] [address]    Culture   Final    MODERATE STAPHYLOCOCCUS AUREUS NO ANAEROBES ISOLATED; CULTURE IN PROGRESS FOR 5 DAYS    Report Status PENDING  Incomplete   Organism ID, Bacteria STAPHYLOCOCCUS AUREUS  Final      Susceptibility   Staphylococcus aureus - MIC*    CIPROFLOXACIN <=0.5 SENSITIVE Sensitive     ERYTHROMYCIN >=8 RESISTANT Resistant     GENTAMICIN <=0.5 SENSITIVE Sensitive     OXACILLIN <=0.25 SENSITIVE Sensitive     TETRACYCLINE <=1 SENSITIVE Sensitive     VANCOMYCIN  <=0.5 SENSITIVE Sensitive     TRIMETH/SULFA <=10 SENSITIVE Sensitive     CLINDAMYCIN RESISTANT Resistant     RIFAMPIN <=0.5 SENSITIVE Sensitive     Inducible Clindamycin POSITIVE Resistant     * MODERATE STAPHYLOCOCCUS AUREUS  Culture, blood (routine x 2)     Status: None (Preliminary result)   Collection Time: 05/07/18 10:20 AM  Result Value Ref Range Status   Specimen Description BLOOD RIGHT ANTECUBITAL  Final   Special Requests   Final  BOTTLES DRAWN AEROBIC ONLY Blood Culture adequate volume   Culture   Final    NO GROWTH < 24 HOURS Performed at [facility], [place] [address], [place], [place] [address]    Report Status PENDING  Incomplete  Culture, blood (routine x 2)     Status: None (Preliminary result)   Collection Time: 05/07/18 10:29 AM  Result Value Ref Range Status   Specimen Description BLOOD RIGHT ANTECUBITAL  Final   Special Requests   Final    BOTTLES DRAWN AEROBIC ONLY Blood Culture adequate volume   Culture   Final    NO GROWTH < 24 HOURS Performed at [facility], [place] [address], [place], [place] [address]    Report Status PENDING  Incomplete     Terri Piedra, [place] for [place] Pager  05/10/2018  11:49 AM

## 2018-05-10 NOTE — Progress Notes (Signed)
Orthopedic Tech Progress Note Patient Details:  Destiny Day 26-Nov-1950 [id number]  CPM Right Knee CPM Right Knee: On Additional Comments: put patient in CPM at 1:00pm     Destiny Day 05/10/2018, 1:31 PM

## 2018-05-10 NOTE — Clinical Social Work Placement (Signed)
   CLINICAL SOCIAL WORK PLACEMENT  NOTE  Date:  05/10/2018  Patient Details  Name: Destiny Day MRN: [id number] Date of Birth: 1951/06/27  Clinical Social Work is seeking post-discharge placement for this patient at the [place] level of care (*CSW will initial, date and re-position this form in  chart as items are completed):  Yes   Patient/family provided with [facility] Work Department's list of facilities offering this level of care within the geographic area requested by the patient (or if unable, by the patient's family).  Yes   Patient/family informed of their freedom to choose among providers that offer the needed level of care, that participate in Medicare, Medicaid or managed care program needed by the patient, have an available bed and are willing to accept the patient.      Patient/family informed of [facility]'s ownership interest in [facility] and [facility], as well as of the fact that they are under no obligation to receive care at these facilities.  PASRR submitted to EDS on       PASRR number received on 05/09/18     Existing PASRR number confirmed on       FL2 transmitted to all facilities in geographic area requested by pt/family on 05/09/18     FL2 transmitted to all facilities within larger geographic area on       Patient informed that his/her managed care company has contracts with or will negotiate with certain facilities, including the following:        Yes   Patient/family informed of bed offers received.  Patient chooses bed at [place], [place]     Physician recommends and patient chooses bed at      Patient to be transferred to [place] on 05/10/18.  Patient to be transferred to facility by [facility]     Patient family notified on 05/10/18 of transfer.  Name of family member notified:  [name]     PHYSICIAN       Additional Comment:     _______________________________________________ Alberteen Sam, LCSW 05/10/2018, 12:15 PM

## 2018-05-10 NOTE — Progress Notes (Signed)
Patient ID: Destiny Day, female   DOB: [birth year], 67 y.o.   MRN: [id number] Plan for discharge today either collapse or account in place.  Cultures have returned as staph aureus not MRSA.  Discharge orders were written and discharge orders for the antibiotic is not written.  Patient states that her knee symptoms have improved the dressing is clean and dry.

## 2018-05-10 NOTE — Progress Notes (Signed)
Patient discharged to [facility] via EMS. All belongings sent with patient husband. Paperwork and prescriptions sent with patient. Report given to [facility] at [facility].

## 2018-05-10 NOTE — Progress Notes (Signed)
CSW coordinating with [facility] SN[facility] regarding patient needs of transportation to dialysis to [facility] on [address] in [place] on M, W, F in which patient would need to be at facility at 10:55 am and ready for pick up at 3:45 pm.   CSW continuing to coordinate care at [facility] with goal of discharge today.   [place], [place]

## 2018-05-10 NOTE — Progress Notes (Signed)
Patient will DC to: [facility]  Anticipated DC date: 05/10/18 Family notified: [name] Transport by: Corey Harold  Per MD patient ready for DC to [facility] . RN, patient, patient's family, and facility notified of DC. Discharge Summary sent to facility. RN given number for report [phone] Room 401A. DC packet on chart. Ambulance transport requested for patient for 2:30 pm. CSW signing off.  [place], [place]

## 2018-05-10 NOTE — Clinical Social Work Note (Signed)
Clinical Social Work Assessment  Patient Details  Name: Destiny Day MRN: [id number] Date of Birth: 12-Apr-1951  Date of referral:  05/09/18               Reason for consult:  Discharge Planning                Permission sought to share information with:  Case Manager, Facility Sport and exercise psychologist, Family Supports Permission granted to share information::  Yes, Verbal Permission Granted  Name::     Financial controller::  SNFs  Relationship::  spouse  Contact Information:  [phone]  Housing/Transportation Living arrangements for the past 2 months:  [place] of Information:  Patient Patient Interpreter Needed:  None Criminal Activity/Legal Involvement Pertinent to Current Situation/Hospitalization:  No - Comment as needed Significant Relationships:  Spouse Lives with:  Spouse Do you feel safe going back to the place where you live?  No Need for family participation in patient care:  Yes (Comment)  Care giving concerns:  CSW received referral for possible SNF placement at time of discharge. Spoke with patient regarding possibility of SNF placement . Patient's  husband is currently unable to care for her at their home given patient's current needs and fall risk.  Patient and  husband  expressed understanding of PT recommendation and are agreeable to SNF placement at time of discharge. CSW to continue to follow and assist with discharge planning needs.     Social Worker assessment / plan:  Spoke with patient and  husband concerning possibility of rehab at [facility] before returning home.    Employment status:  Retired Forensic scientist:  Medicare PT Recommendations:  [place] / Referral to community resources:  [place]  Patient/Family's Response to care:  Patient and  husband recognize need for rehab before returning home and are agreeable to a SNF in [place]. They report preference for   [facility] and [facility] .  CSW explained insurance authorization process. Patient's family reported that they want patient to get stronger to be able to come back home.    Patient/Family's Understanding of and Emotional Response to Diagnosis, Current Treatment, and Prognosis:  Patient/family is realistic regarding therapy needs and expressed being hopeful for SNF placement. Patient expressed understanding of CSW role and discharge process as well as medical condition. No questions/concerns about plan or treatment.    Emotional Assessment Appearance:  Appears stated age Attitude/Demeanor/Rapport:  Engaged, Gracious Affect (typically observed):  Accepting, Adaptable Orientation:  Oriented to Self, Oriented to Place, Oriented to  Time, Oriented to Situation Alcohol / Substance use:  Not Applicable Psych involvement (Current and /or in the community):  No (Comment)  Discharge Needs  Concerns to be addressed:  Discharge Planning Concerns Readmission within the last 30 days:  No Current discharge risk:  Dependent with Mobility Barriers to Discharge:  Continued Medical Work up   [employer], [place] 05/10/2018, 8:19 AM

## 2018-05-11 DIAGNOSIS — E1122 Type 2 diabetes mellitus with diabetic chronic kidney disease: Secondary | ICD-10-CM | POA: Diagnosis not present

## 2018-05-11 DIAGNOSIS — M0000 Staphylococcal arthritis, unspecified joint: Secondary | ICD-10-CM | POA: Insufficient documentation

## 2018-05-11 DIAGNOSIS — N2581 Secondary hyperparathyroidism of renal origin: Secondary | ICD-10-CM | POA: Diagnosis not present

## 2018-05-11 DIAGNOSIS — E876 Hypokalemia: Secondary | ICD-10-CM | POA: Diagnosis not present

## 2018-05-11 DIAGNOSIS — N186 End stage renal disease: Secondary | ICD-10-CM | POA: Diagnosis not present

## 2018-05-11 DIAGNOSIS — D631 Anemia in chronic kidney disease: Secondary | ICD-10-CM | POA: Diagnosis not present

## 2018-05-11 LAB — ANAEROBIC AND AEROBIC CULTURE
MICRO NUMBER:: 91220336
MICRO NUMBER:: 91220337
SPECIMEN QUALITY:: ADEQUATE
SPECIMEN QUALITY:: ADEQUATE

## 2018-05-11 LAB — AEROBIC/ANAEROBIC CULTURE (SURGICAL/DEEP WOUND)

## 2018-05-11 LAB — AEROBIC/ANAEROBIC CULTURE W GRAM STAIN (SURGICAL/DEEP WOUND)

## 2018-05-12 ENCOUNTER — Other Ambulatory Visit: Payer: Self-pay | Admitting: *Deleted

## 2018-05-12 LAB — CULTURE, BLOOD (ROUTINE X 2)
Culture: NO GROWTH
Culture: NO GROWTH
Special Requests: ADEQUATE
Special Requests: ADEQUATE

## 2018-05-12 NOTE — Patient Outreach (Signed)
[facility]) Care Management  05/12/2018  NAI BORROMEO February 11, 1951 [id number]   Attended discharge planning meeting at [facility] in [place] with [place] team members [place] and [place]. Facility report noted patient admitted to facility 05/10/18. Nursing states patient is receiving antibiotics(cerftin) while at dialysis for infection to knee. PT stating patient has an unsteady gait at this time.  PT states patient has 14 stairs to get into her home apartment.  Patient's desired discharge plan is home.   Did not see patient at this time related to patient's recent arrival and not close to discharge at this time.  Plan to see patient at next week's facility visit.   Rutherford Limerick RN, BSN [place] Acute Care Coordinator [phone]) Business Mobile [phone]) Toll free office

## 2018-05-13 DIAGNOSIS — N2581 Secondary hyperparathyroidism of renal origin: Secondary | ICD-10-CM | POA: Diagnosis not present

## 2018-05-13 DIAGNOSIS — E1122 Type 2 diabetes mellitus with diabetic chronic kidney disease: Secondary | ICD-10-CM | POA: Diagnosis not present

## 2018-05-13 DIAGNOSIS — E876 Hypokalemia: Secondary | ICD-10-CM | POA: Diagnosis not present

## 2018-05-13 DIAGNOSIS — M0000 Staphylococcal arthritis, unspecified joint: Secondary | ICD-10-CM | POA: Diagnosis not present

## 2018-05-13 DIAGNOSIS — D631 Anemia in chronic kidney disease: Secondary | ICD-10-CM | POA: Diagnosis not present

## 2018-05-13 DIAGNOSIS — N186 End stage renal disease: Secondary | ICD-10-CM | POA: Diagnosis not present

## 2018-05-15 DIAGNOSIS — M00861 Arthritis due to other bacteria, right knee: Secondary | ICD-10-CM | POA: Diagnosis not present

## 2018-05-15 DIAGNOSIS — N185 Chronic kidney disease, stage 5: Secondary | ICD-10-CM | POA: Diagnosis not present

## 2018-05-15 DIAGNOSIS — I7389 Other specified peripheral vascular diseases: Secondary | ICD-10-CM | POA: Diagnosis not present

## 2018-05-15 DIAGNOSIS — I428 Other cardiomyopathies: Secondary | ICD-10-CM | POA: Diagnosis not present

## 2018-05-15 DIAGNOSIS — E1121 Type 2 diabetes mellitus with diabetic nephropathy: Secondary | ICD-10-CM | POA: Diagnosis not present

## 2018-05-16 ENCOUNTER — Ambulatory Visit: Payer: Self-pay | Admitting: Ophthalmology

## 2018-05-16 DIAGNOSIS — E876 Hypokalemia: Secondary | ICD-10-CM | POA: Diagnosis not present

## 2018-05-16 DIAGNOSIS — N2581 Secondary hyperparathyroidism of renal origin: Secondary | ICD-10-CM | POA: Diagnosis not present

## 2018-05-16 DIAGNOSIS — D631 Anemia in chronic kidney disease: Secondary | ICD-10-CM | POA: Diagnosis not present

## 2018-05-16 DIAGNOSIS — N186 End stage renal disease: Secondary | ICD-10-CM | POA: Diagnosis not present

## 2018-05-16 DIAGNOSIS — E1122 Type 2 diabetes mellitus with diabetic chronic kidney disease: Secondary | ICD-10-CM | POA: Diagnosis not present

## 2018-05-16 DIAGNOSIS — M0000 Staphylococcal arthritis, unspecified joint: Secondary | ICD-10-CM | POA: Diagnosis not present

## 2018-05-16 NOTE — Progress Notes (Signed)
I was unable to reach patient by phone.  I left  A message on voice mail on the 2 numbers listed under patient's name..  I instructed the patient to arrive at [place] entrance at 1240   , nothing to eat or drink after midnight.   I instructed the patient to take the following medications in the am with just enough water to get them down:COreg, Bidil. Omeprazole. Prn: Albuterol inhaler, Symbicort Inhaler, bring Albuterol with you; may take percocet or Valium if needed.  I asked patient to shower, wear clean clothes, brush teeth.to not wear any lotions, powders, cologne, jewelry, piercing, make-up or nail polish.  I asked the patient to call [phone], in the am if there were any questions or problems, also that she needs a driver and someone to stay with her for the first 24 hours after surgery. I instructed patient to take 5 units of Humulin N. I instructed patient to check CBG after awaking and every 2 hours until arrival  to the hospital.  I Instructed patient if CBG is less than 70 to drink1/2 cup of a clear juice. Recheck CBG in 15 minutes then call pre- op desk at [phone] for further instructions.

## 2018-05-16 NOTE — H&P (Signed)
  Date of examination:   05/03/18  Indication for surgery: PDR with TRD OS involving macula  Pertinent past medical history:  Past Medical History:  Diagnosis Date  . Abdominal bruit   . Anemia   . Anxiety   . Arthritis    Osteoarthritis  . Asthma   . Cervical disc disease    "pinced nerve"  . CHF (congestive heart failure) ([place])   . Chronic kidney disease    dialysis - M/W/F  . Complication of anesthesia    " after I got home from my last procedure, I started itching."  . Diabetes mellitus    Type II  . Diverticulitis   . ESRD on peritoneal dialysis [facility])    Hemodialysis - MWF- [place]   . GERD (gastroesophageal reflux disease)    from medications  . GI bleed 03/31/2013  . History of hiatal hernia   . Hyperlipidemia   . Hypertension   . Neuropathy    left leg  . Osteoporosis   . Peripheral vascular disease ([place])   . Pneumonia    "very young" and a few years ago  . Seasonal allergies   . Shortness of breath dyspnea    WIth exertion  . Sleep apnea    can't afford cpap    Pertinent ocular history:  Severe proliferative diabetic retinopathy OU  Pertinent family history:  Family History  Problem Relation Age of Onset  . Other Mother        not sure of cause of death  . Diabetes Father   . Pancreatic cancer Maternal Grandmother   . Colon cancer Neg Hx     General:  Healthy appearing patient in no distress.    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

## 2018-05-17 ENCOUNTER — Encounter (HOSPITAL_COMMUNITY): Payer: Self-pay | Admitting: Anesthesiology

## 2018-05-17 ENCOUNTER — Ambulatory Visit (HOSPITAL_COMMUNITY): Admission: RE | Admit: 2018-05-17 | Payer: Medicare Other | Source: Ambulatory Visit | Admitting: Ophthalmology

## 2018-05-17 ENCOUNTER — Ambulatory Visit (INDEPENDENT_AMBULATORY_CARE_PROVIDER_SITE_OTHER): Payer: Medicare Other | Admitting: Physician Assistant

## 2018-05-17 ENCOUNTER — Encounter (INDEPENDENT_AMBULATORY_CARE_PROVIDER_SITE_OTHER): Payer: Self-pay | Admitting: Physician Assistant

## 2018-05-17 VITALS — Ht 65.0 in | Wt 137.1 lb

## 2018-05-17 DIAGNOSIS — Z9889 Other specified postprocedural states: Secondary | ICD-10-CM

## 2018-05-17 DIAGNOSIS — E1121 Type 2 diabetes mellitus with diabetic nephropathy: Secondary | ICD-10-CM

## 2018-05-17 DIAGNOSIS — N186 End stage renal disease: Secondary | ICD-10-CM

## 2018-05-17 DIAGNOSIS — Z89431 Acquired absence of right foot: Secondary | ICD-10-CM

## 2018-05-17 DIAGNOSIS — M009 Pyogenic arthritis, unspecified: Secondary | ICD-10-CM

## 2018-05-17 DIAGNOSIS — Z992 Dependence on renal dialysis: Secondary | ICD-10-CM

## 2018-05-17 DIAGNOSIS — Z794 Long term (current) use of insulin: Secondary | ICD-10-CM

## 2018-05-17 SURGERY — PARS PLANA VITRECTOMY WITH 25 GAUGE
Anesthesia: Monitor Anesthesia Care | Laterality: Left

## 2018-05-17 MED ORDER — CEFAZOLIN SUBCONJUNCTIVAL INJECTION 100 MG/0.5 ML
100.0000 mg | INJECTION | SUBCONJUNCTIVAL | Status: AC
  Filled 2018-05-17: qty 5

## 2018-05-17 NOTE — Progress Notes (Signed)
Office Visit Note   Patient: Destiny Day           Date of Birth: 1951-01-27           MRN: [id number] Visit Date: 05/17/2018              Requested by: Destiny Hatchet, MD [address] [place], [place] [address] PCP: Destiny Hatchet, MD  Chief Complaint  Patient presents with  . Right Leg - Routine Post Op      HPI: Patient is a 67 year old female who seen for postoperative follow-up following right knee arthroscopic irrigation and debridement for a septic right knee joint on 05/06/2018.Marland Kitchen  Operative cultures grew moderate staph aureus which was oxacillin sensitive.  Patient remains on parenteral antibiotic therapy, cefazolin 2 g in dialysis every Monday Wednesday Friday.  She presents for postoperative follow-up. She reports moderate pain in the right knee at times. She is working with Physical therapy in the skilled nursing facility and reports this hurts.   Assessment & Plan: Visit Diagnoses:  1. Status post arthroscopy of right knee   2. Pyogenic arthritis of right knee joint, due to unspecified organism ([place])   3. Type 2 diabetes mellitus with diabetic nephropathy, with long-term current use of insulin ([place])   4. End-stage renal disease on hemodialysis ([place])   5. 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[place])     Plan: Sutures harvested this visit. Continue PT for progression of gait and ROM, strengthening.  Continue Cefazolin .Follow up in 2 weeks.   Follow-Up Instructions: Return in about 2 weeks (around 05/31/2018).   Ortho Exam  Patient is alert, oriented, no adenopathy, well-dressed, normal affect, normal respiratory effort. Right knee ports are healing well and without signs of infection. Knee slightly warm, no erythema, mild edema. Pain with ROM.   Imaging: No results found. No images are attached to the encounter.  Labs: Lab Results  Component Value Date   HGBA1C 7.2 (H) 05/06/2018   HGBA1C 6.4 (H) 12/31/2017   HGBA1C 5.0 09/03/2017   REPTSTATUS 05/12/2018 FINAL 05/07/2018   GRAMSTAIN  05/06/2018    MODERATE WBC PRESENT, PREDOMINANTLY PMN NO ORGANISMS SEEN    CULT  05/07/2018    NO GROWTH 5 DAYS Performed at [facility], [place] [address], [place], [place] [address]    [place] 05/06/2018     Lab Results  Component Value Date   ALBUMIN 2.2 (L) 05/09/2018   ALBUMIN 2.4 (L) 05/07/2018   ALBUMIN 2.6 (L) 05/06/2018    Body mass index is 22.82 kg/m.  Orders:  No orders of the defined types were placed in this encounter.  No orders of the defined types were placed in this encounter.    Procedures: No procedures performed  Clinical Data: No additional findings.  ROS:  All other systems negative, except as noted in the HPI. Review of Systems  Objective: Vital Signs: Ht 5\' 5"  (1.651 m)   Wt 137 lb 2.1 oz (62.2 kg)   BMI 22.82 kg/m   Specialty Comments:  No specialty comments available.  PMFS History: Patient Active Problem List   Diagnosis Date Noted  . Septic arthritis of knee, right ([place]) 05/06/2018  . Degenerative tear of posterior horn of medial meniscus, right   . Old complex tear of lateral meniscus of right knee   . Loose body in knee, right knee   . Acute pulmonary edema ([place]) 02/21/2018  . 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[place])  02/03/2018  . End-stage renal disease on hemodialysis ([place])   . Respiratory failure ([place]) 01/21/2018  . Achilles tendon contracture, right 11/09/2017  . Labile blood glucose   . Anemia of infection and chronic disease   . Essential hypertension   . Black stool   . Right sided weakness   . Subdural hemorrhage following injury ([place]) 08/30/2017  . Diabetic peripheral neuropathy ([place])   . Chronic obstructive pulmonary disease ([place])   . Seizure prophylaxis   . Subdural hematoma ([place]) 08/24/2017  . Traumatic subdural hematoma ([place]) 08/23/2017  . Fall   . SDH (subdural hematoma) ([place])   . Acute respiratory failure  with hypoxia ([place])   . Diabetes mellitus type 2 in nonobese (HCC)   . Leukocytosis   . Labile blood pressure   . Generalized anxiety disorder   . Debility 08/16/2017  . Amputated great toe of right foot ([place])   . Amputated toe of left foot ([place])   . Post-operative pain   . ESRD on dialysis ([place])   . Diabetes mellitus ([place])   . Anxiety state   . Benign essential HTN   . Acute blood loss anemia   . Anemia of chronic disease   . Idiopathic chronic venous hypertension of both lower extremities with inflammation 10/13/2016  . S/P transmetatarsal amputation of foot, left ([place]) 09/21/2016  . Onychomycosis 08/15/2016  . PAD (peripheral artery disease) ([place]) 07/08/2016  . Atherosclerosis of native artery of left lower extremity with gangrene ([place]) 06/23/2016  . GERD (gastroesophageal reflux disease) 10/07/2015  . ARF (acute renal failure) ([place])   . Hypothermia 06/12/2015  . Acute on chronic renal failure ([place]) 06/12/2015  . CHF (congestive heart failure) ([place]) 07/30/2014  . CKD (chronic kidney disease) stage 4, GFR 15-29 ml/min (HCC) 06/27/2014  . Hypertensive heart disease 05/25/2013  . CKD (chronic kidney disease) stage 3, GFR 30-59 ml/min (HCC) 05/25/2013  . Pulmonary edema 05/23/2013  . Type 2 diabetes mellitus with diabetic nephropathy ([place]) 05/23/2013  . Type 2 diabetes mellitus with hyperosmolar nonketotic hyperglycemia ([place]) 04/13/2013  . Chronic diastolic CHF (congestive heart failure) ([place]) 04/13/2013  . UGI bleed 03/31/2013  . Hypokalemia 08/24/2012  . Type II or unspecified type diabetes mellitus without mention of complication, not stated as uncontrolled 12/27/2007  . HLD (hyperlipidemia) 12/27/2007  . ALLERGIC RHINITIS 12/27/2007  . Asthma 12/27/2007   Past Medical History:  Diagnosis Date  . Abdominal bruit   . Anemia   . Anxiety   . Arthritis    Osteoarthritis  . Asthma   . Cervical disc disease    "pinced nerve"  . CHF (congestive heart failure) ([place])   .  Chronic kidney disease    dialysis - M/W/F  . Complication of anesthesia    " after I got home from my last procedure, I started itching."  . Diabetes mellitus    Type II  . Diverticulitis   . ESRD on peritoneal dialysis [facility])    Hemodialysis - MWF- [place]   . GERD (gastroesophageal reflux disease)    from medications  . GI bleed 03/31/2013  . History of hiatal hernia   . Hyperlipidemia   . Hypertension   . Neuropathy    left leg  . Osteoporosis   . Peripheral vascular disease ([place])   . Pneumonia    "very young" and a few years ago  . Seasonal allergies   . Shortness of breath dyspnea    WIth exertion  . Sleep apnea  can't afford cpap    Family History  Problem Relation Age of Onset  . Other Mother        not sure of cause of death  . Diabetes Father   . Pancreatic cancer Maternal Grandmother   . Colon cancer Neg Hx     Past Surgical History:  Procedure Laterality Date  . A/V SHUNTOGRAM N/A 09/22/2016   Procedure: A/V Shuntogram - left arm;  Surgeon: Serafina Mitchell, MD;  Location: [facility];  Service: Cardiovascular;  Laterality: N/A;  . A/V SHUNTOGRAM N/A 03/22/2018   Procedure: A/V SHUNTOGRAM - left arm;  Surgeon: Serafina Mitchell, MD;  Location: [facility];  Service: Cardiovascular;  Laterality: N/A;  . ABDOMINAL HYSTERECTOMY  1993`  . AMPUTATION Left 09/01/2016   Procedure: LEFT FOOT TRANSMETATARSAL AMPUTATION;  Surgeon: Newt Minion, MD;  Location: [facility];  Service: Orthopedics;  Laterality: Left;  . AMPUTATION Right 08/11/2017   Procedure: RIGHT GREAT TOE AMPUTATION DIGIT;  Surgeon: Rosetta Posner, MD;  Location: [facility];  Service: Vascular;  Laterality: Right;  . AMPUTATION Right 12/31/2017   Procedure: RIGHT TRANSMETATARSAL AMPUTATION;  Surgeon: Newt Minion, MD;  Location: [facility];  Service: Orthopedics;  Laterality: Right;  . AV FISTULA PLACEMENT Left 04/21/2016   Procedure: INSERTION OF ARTERIOVENOUS (AV) GORE-TEX GRAFT ARM LEFT;  Surgeon: Elam Dutch, MD;  Location: [facility];  Service: Vascular;  Laterality: Left;  . [place] TRANSPOSITION Left 07/10/2014   Procedure: BASCILIC VEIN TRANSPOSITION;  Surgeon: Angelia Mould, MD;  Location: [facility];  Service: Vascular;  Laterality: Left;  . BASCILIC VEIN TRANSPOSITION Right 11/08/2014   Procedure: FIRST STAGE BASILIC VEIN TRANSPOSITION;  Surgeon: Angelia Mould, MD;  Location: [facility];  Service: Vascular;  Laterality: Right;  . BASCILIC VEIN TRANSPOSITION Right 01/18/2015   Procedure: SECOND STAGE BASILIC VEIN TRANSPOSITION;  Surgeon: Angelia Mould, MD;  Location: [facility];  Service: Vascular;  Laterality: Right;  . CRANIOTOMY N/A 08/23/2017   Procedure: CRANIOTOMY HEMATOMA EVACUATION SUBDURAL;  Surgeon: Ashok Pall, MD;  Location: [facility];  Service: Neurosurgery;  Laterality: N/A;  . CRANIOTOMY Left 08/24/2017   Procedure: CRANIOTOMY FOR RECURRENT ACUTE SUBDURAL HEMATOMA;  Surgeon: Ashok Pall, MD;  Location: [facility];  Service: Neurosurgery;  Laterality: Left;  . ESOPHAGOGASTRODUODENOSCOPY N/A 03/31/2013   Procedure: ESOPHAGOGASTRODUODENOSCOPY (EGD);  Surgeon: Gatha Mayer, MD;  Location[facility] ENDOSCOPY;  Service: Endoscopy;  Laterality: N/A;  . EYE SURGERY     laser surgery  . FEMORAL-POPLITEAL BYPASS GRAFT Left 07/08/2016   Procedure: LEFT  FEMORAL-BELOW KNEE POPLITEAL ARTERY BYPASS GRAFT USING 6MM X 80 CM PROPATEN GORETEX GRAFT WITH RINGS.;  Surgeon: Rosetta Posner, MD;  Location: [facility];  Service: Vascular;  Laterality: Left;  . FEMORAL-POPLITEAL BYPASS GRAFT Right 08/11/2017   Procedure: RIGHT FEMORAL TO BELOW KNEE POPLITEAL ARTERKY  BYPASS GRAFT USING 6MM RINGED PROPATEN GRAFT;  Surgeon: Rosetta Posner, MD;  Location: [facility];  Service: Vascular;  Laterality: Right;  . FISTULOGRAM Left 10/29/2014   Procedure: FISTULOGRAM;  Surgeon: Angelia Mould, MD;  Location: [facility] CATH LAB;  Service: Cardiovascular;  Laterality: Left;  . KNEE ARTHROSCOPY Right 05/06/2018   Procedure:  RIGHT KNEE ARTHROSCOPY;  Surgeon: Newt Minion, MD;  Location: [facility];  Service: Orthopedics;  Laterality: Right;  . LIGATION OF ARTERIOVENOUS  FISTULA Left 04/21/2016   Procedure: LIGATION OF ARTERIOVENOUS  FISTULA LEFT ARM;  Surgeon: Elam Dutch, MD;  Location: [facility];  Service: Vascular;  Laterality: Left;  . LOWER EXTREMITY ANGIOGRAPHY N/A 04/06/2017   Procedure: Lower Extremity Angiography - Right;  Surgeon: Serafina Mitchell, MD;  Location: [facility];  Service: Cardiovascular;  Laterality: N/A;  . PATCH ANGIOPLASTY Right 01/18/2015   Procedure: BASILIC VEIN PATCH ANGIOPLASTY USING VASCUGUARD PATCH;  Surgeon: Angelia Mould, MD;  Location: [facility];  Service: Vascular;  Laterality: Right;  . PERIPHERAL VASCULAR BALLOON ANGIOPLASTY  09/22/2016   Procedure: Peripheral Vascular Balloon Angioplasty;  Surgeon: Serafina Mitchell, MD;  Location: [facility];  Service: Cardiovascular;;  Lt. Fistula  . PERIPHERAL VASCULAR BALLOON ANGIOPLASTY Left 03/22/2018   Procedure: PERIPHERAL VASCULAR BALLOON ANGIOPLASTY;  Surgeon: Serafina Mitchell, MD;  Location: [facility];  Service: Cardiovascular;  Laterality: Left;  Arm shunt  . PERIPHERAL VASCULAR CATHETERIZATION N/A 06/23/2016   Procedure: Abdominal Aortogram w/Lower Extremity;  Surgeon: Serafina Mitchell, MD;  Location: [facility];  Service: Cardiovascular;  Laterality: N/A;  . PERIPHERAL VASCULAR CATHETERIZATION  06/23/2016   Procedure: Peripheral Vascular Intervention;  Surgeon: Serafina Mitchell, MD;  Location: [facility];  Service: Cardiovascular;;  lt common and external illiac artery   Social History   Occupational History  . Occupation: Retired  Tobacco Use  . Smoking status: Former Smoker    Packs/day: 0.35    Years: 40.00    Pack years: 14.00    Types: Cigarettes    Last attempt to quit: 05/10/2012    Years since quitting: 6.0  . Smokeless tobacco: Never Used  Substance and Sexual Activity  .  Alcohol use: No  . Drug use: No    Comment: marijuana; quit in early 1980's  . Sexual activity: Yes

## 2018-05-17 NOTE — Anesthesia Preprocedure Evaluation (Deleted)
Anesthesia Evaluation    Reviewed: Allergy & Precautions, Patient's Chart, lab work & pertinent test results  History of Anesthesia Complications (+) history of anesthetic complications  Airway        Dental   Pulmonary asthma , sleep apnea , COPD, former smoker,           Cardiovascular hypertension, Pt. on home beta blockers and Pt. on medications + Peripheral Vascular Disease       Neuro/Psych PSYCHIATRIC DISORDERS  Neuromuscular disease    GI/Hepatic Neg liver ROS, GERD  ,  Endo/Other  diabetes, Type 1  Renal/GU Renal disease     Musculoskeletal  (+) Arthritis ,   Abdominal   Peds  Hematology  (+) Blood dyscrasia, anemia ,   Anesthesia Other Findings   Reproductive/Obstetrics                             Anesthesia Physical Anesthesia Plan  ASA: III  Anesthesia Plan: MAC   Post-op Pain Management:    Induction: Intravenous  PONV Risk Score and Plan: 4 or greater and Treatment may vary due to age or medical condition, Ondansetron, Dexamethasone and Scopolamine patch - Pre-op  Airway Management Planned: Natural Airway and Nasal Cannula  Additional Equipment:   Intra-op Plan:   Post-operative Plan:   Informed Consent: I have reviewed the patients History and Physical, chart, labs and discussed the procedure including the risks, benefits and alternatives for the proposed anesthesia with the patient or authorized representative who has indicated his/her understanding and acceptance.   Dental advisory given  Plan Discussed with:   Anesthesia Plan Comments:         Anesthesia Quick Evaluation

## 2018-05-18 DIAGNOSIS — N2581 Secondary hyperparathyroidism of renal origin: Secondary | ICD-10-CM | POA: Diagnosis not present

## 2018-05-18 DIAGNOSIS — M0000 Staphylococcal arthritis, unspecified joint: Secondary | ICD-10-CM | POA: Diagnosis not present

## 2018-05-18 DIAGNOSIS — E876 Hypokalemia: Secondary | ICD-10-CM | POA: Diagnosis not present

## 2018-05-18 DIAGNOSIS — N186 End stage renal disease: Secondary | ICD-10-CM | POA: Diagnosis not present

## 2018-05-18 DIAGNOSIS — D631 Anemia in chronic kidney disease: Secondary | ICD-10-CM | POA: Diagnosis not present

## 2018-05-18 DIAGNOSIS — E1122 Type 2 diabetes mellitus with diabetic chronic kidney disease: Secondary | ICD-10-CM | POA: Diagnosis not present

## 2018-05-19 ENCOUNTER — Other Ambulatory Visit: Payer: Self-pay | Admitting: *Deleted

## 2018-05-19 NOTE — Patient Outreach (Signed)
[facility]) Care Management  05/19/2018  Destiny Day 19-Jun-1951 [id number]   Attended interdisciplinary team meeting at [facility] in [place] with [place] team members Jari Pigg and [place].  Patient admitted 05/10/18 to SNF after a hospital admission for Right knee infection.  Nursing states patient continues to receive antibiotics when at dialysis.  PT states patient has 14 stairs to get into her home and patient continues to be unsteady on her feet.  Patient's listed caregiver is her spouse. Patient's desired discharge disposition is home.  Attempted to talk with patient at the bedside but patient was headed out of room with staff for therapy. Spoke briefly, introduced [facility] services and gave patient a [facility] packet to look over.   Plan to visit with patient at next facility visit and assess for [facility] CM needs.   Rutherford Limerick RN, BSN [place] Acute Care Coordinator [phone]) Business Mobile [phone]) Toll free office

## 2018-05-20 DIAGNOSIS — E876 Hypokalemia: Secondary | ICD-10-CM | POA: Diagnosis not present

## 2018-05-20 DIAGNOSIS — E1122 Type 2 diabetes mellitus with diabetic chronic kidney disease: Secondary | ICD-10-CM | POA: Diagnosis not present

## 2018-05-20 DIAGNOSIS — N186 End stage renal disease: Secondary | ICD-10-CM | POA: Diagnosis not present

## 2018-05-20 DIAGNOSIS — N2581 Secondary hyperparathyroidism of renal origin: Secondary | ICD-10-CM | POA: Diagnosis not present

## 2018-05-20 DIAGNOSIS — M0000 Staphylococcal arthritis, unspecified joint: Secondary | ICD-10-CM | POA: Diagnosis not present

## 2018-05-20 DIAGNOSIS — D631 Anemia in chronic kidney disease: Secondary | ICD-10-CM | POA: Diagnosis not present

## 2018-05-23 DIAGNOSIS — D631 Anemia in chronic kidney disease: Secondary | ICD-10-CM | POA: Diagnosis not present

## 2018-05-23 DIAGNOSIS — E1122 Type 2 diabetes mellitus with diabetic chronic kidney disease: Secondary | ICD-10-CM | POA: Diagnosis not present

## 2018-05-23 DIAGNOSIS — N2581 Secondary hyperparathyroidism of renal origin: Secondary | ICD-10-CM | POA: Diagnosis not present

## 2018-05-23 DIAGNOSIS — E876 Hypokalemia: Secondary | ICD-10-CM | POA: Diagnosis not present

## 2018-05-23 DIAGNOSIS — N186 End stage renal disease: Secondary | ICD-10-CM | POA: Diagnosis not present

## 2018-05-23 DIAGNOSIS — M0000 Staphylococcal arthritis, unspecified joint: Secondary | ICD-10-CM | POA: Diagnosis not present

## 2018-05-25 DIAGNOSIS — E876 Hypokalemia: Secondary | ICD-10-CM | POA: Diagnosis not present

## 2018-05-25 DIAGNOSIS — N2581 Secondary hyperparathyroidism of renal origin: Secondary | ICD-10-CM | POA: Diagnosis not present

## 2018-05-25 DIAGNOSIS — M0000 Staphylococcal arthritis, unspecified joint: Secondary | ICD-10-CM | POA: Diagnosis not present

## 2018-05-25 DIAGNOSIS — N186 End stage renal disease: Secondary | ICD-10-CM | POA: Diagnosis not present

## 2018-05-25 DIAGNOSIS — E1122 Type 2 diabetes mellitus with diabetic chronic kidney disease: Secondary | ICD-10-CM | POA: Diagnosis not present

## 2018-05-25 DIAGNOSIS — D631 Anemia in chronic kidney disease: Secondary | ICD-10-CM | POA: Diagnosis not present

## 2018-05-27 ENCOUNTER — Other Ambulatory Visit: Payer: Self-pay | Admitting: *Deleted

## 2018-05-27 DIAGNOSIS — I159 Secondary hypertension, unspecified: Secondary | ICD-10-CM | POA: Diagnosis not present

## 2018-05-27 DIAGNOSIS — N2581 Secondary hyperparathyroidism of renal origin: Secondary | ICD-10-CM | POA: Diagnosis not present

## 2018-05-27 DIAGNOSIS — M0000 Staphylococcal arthritis, unspecified joint: Secondary | ICD-10-CM | POA: Diagnosis not present

## 2018-05-27 DIAGNOSIS — N186 End stage renal disease: Secondary | ICD-10-CM | POA: Diagnosis not present

## 2018-05-27 DIAGNOSIS — H2513 Age-related nuclear cataract, bilateral: Secondary | ICD-10-CM | POA: Diagnosis not present

## 2018-05-27 DIAGNOSIS — Z992 Dependence on renal dialysis: Secondary | ICD-10-CM | POA: Diagnosis not present

## 2018-05-27 DIAGNOSIS — H40033 Anatomical narrow angle, bilateral: Secondary | ICD-10-CM | POA: Diagnosis not present

## 2018-05-27 DIAGNOSIS — E1122 Type 2 diabetes mellitus with diabetic chronic kidney disease: Secondary | ICD-10-CM | POA: Diagnosis not present

## 2018-05-27 DIAGNOSIS — D631 Anemia in chronic kidney disease: Secondary | ICD-10-CM | POA: Diagnosis not present

## 2018-05-27 DIAGNOSIS — E876 Hypokalemia: Secondary | ICD-10-CM | POA: Diagnosis not present

## 2018-05-27 NOTE — Patient Outreach (Signed)
[facility]) Care Management  05/27/2018  Destiny Day 1950-11-30 [id number]   Attended interdisciplinary team meeting at [facility] in [place] with [place] team members [place]. Patient admitted to snf after contracting an infection in her knee requiring surgery.  Patient with medical history of diabetes, CKD with dialysis on MWF, and HTN PT states patient continues to struggle with stairs which is concerning for discharge planning related to patient has 14 stairs she has to manage inside her home.  Nursing states patient is receiving antibiotics while at dialysis.  Discharge planner states there is no discharge date set at this time.   Met with patient in her room.  Patient sitting up in her wheel chair, alert oriented and pleasant.  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 She stated it was hard for her to be so dependant now, relying on her spouse to do everything now.  Explained [facility] Education officer, community.  Patient explained she was feeling stressed related to medical bills and a loss of some of her spouse's income.  Patient states she is frustrated with her inability to climb stairs because she has several stairs she has to climb to get to her bedroom.  Patient concerned she may temporary care givers if she does not make marked improvement before discharge which would be another expense they don't have. Talked with patient about a [facility] LCSW calling and talking with her about these concerns. Patient agreeable to [facility] services.   Referral place for [facility] LCSW to contact patient while at SNF.  Referral placed for [facility] to engage patient at SNF discharge for transition of care.  Plan to make [facility] team aware pt accepted services.   Rutherford Limerick RN, BSN [place] Acute Care Coordinator [phone]) Business Mobile [phone]) Toll free office

## 2018-05-28 IMAGING — CT CT HEAD W/O CM
4 of 5 series · 16 of 47 positions shown, 18 images · non-contrast
Comparison: 09/05/2017

CLINICAL DATA: Subdural hematoma in July 2017, underwent
craniotomy, follow-up

EXAM:
CT HEAD WITHOUT CONTRAST
TECHNIQUE: Contiguous axial images were obtained from the base of the skull
through the vertex without intravenous contrast. Sagittal and
coronal MPR images reconstructed from axial data set.

[Series 2: head 5.00 hr40 s3 ibhc · axial · 0.41mm/px · z∈[-553,-438]mm · 8 of 31 slices shown, 10 images]
[im 4/31  brain]
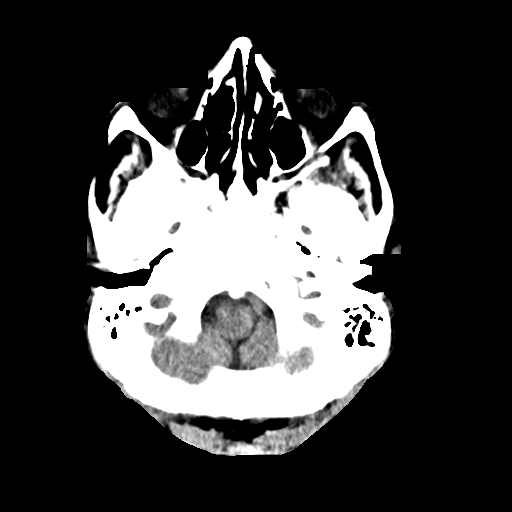
[im 4/31  bone]
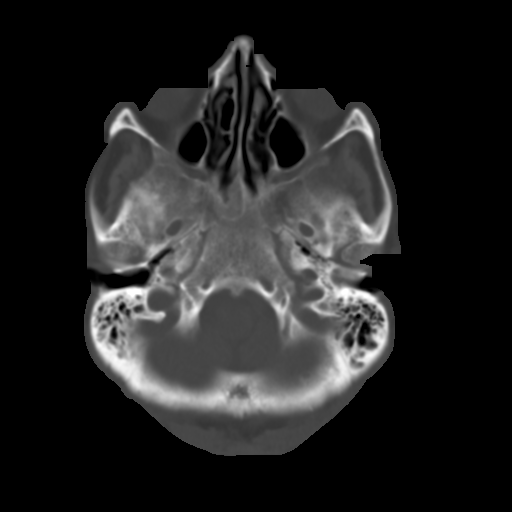
[im 7/31  brain]
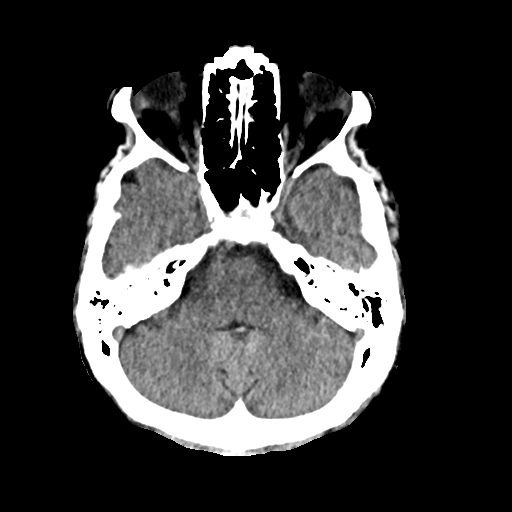
[im 11/31  brain]
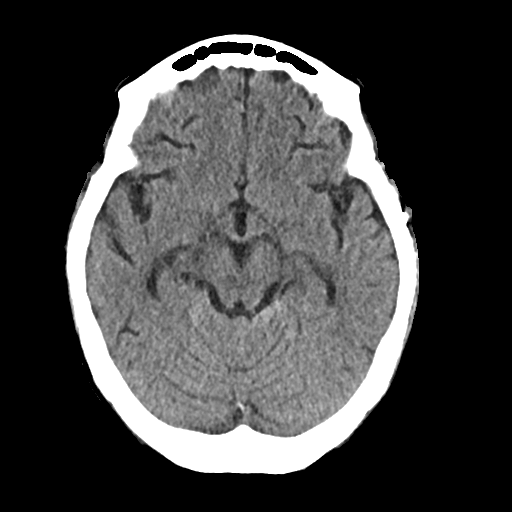
[im 14/31  brain]
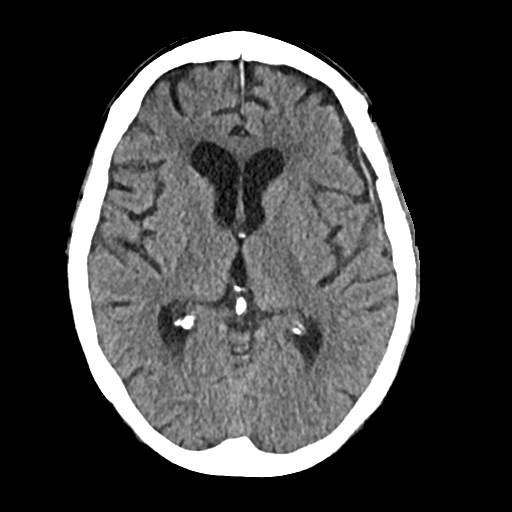
[im 17/31  brain]
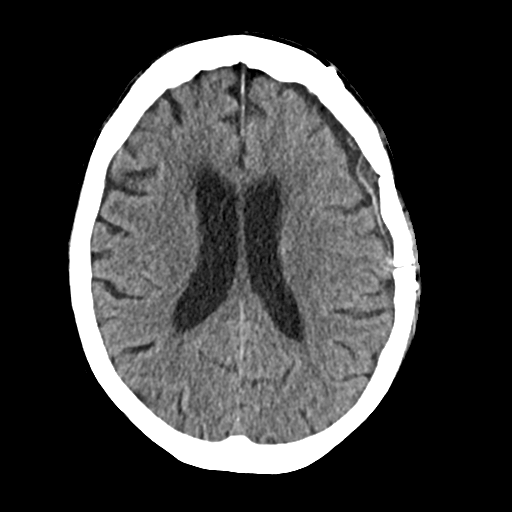
[im 17/31  bone]
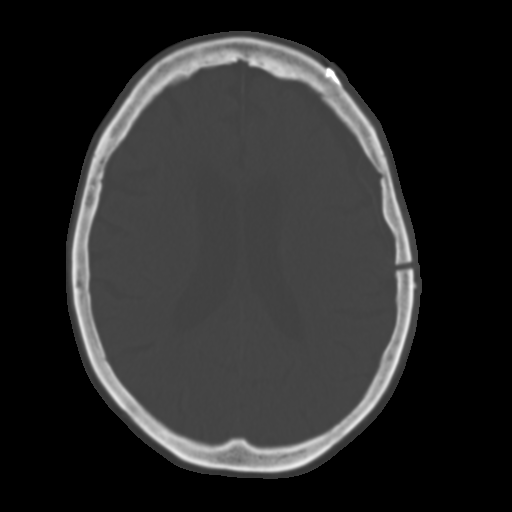
[im 21/31  brain]
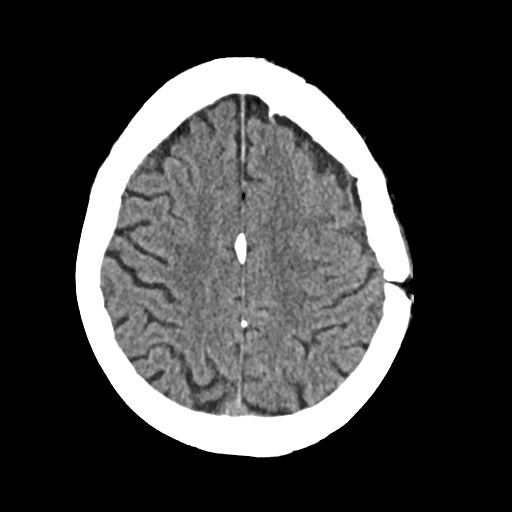
[im 24/31  brain]
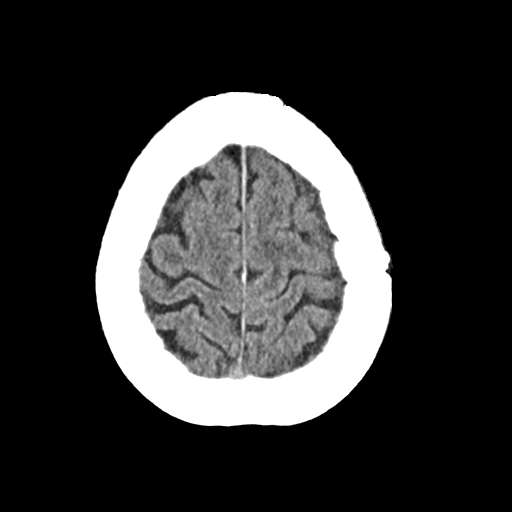
[im 27/31  brain]
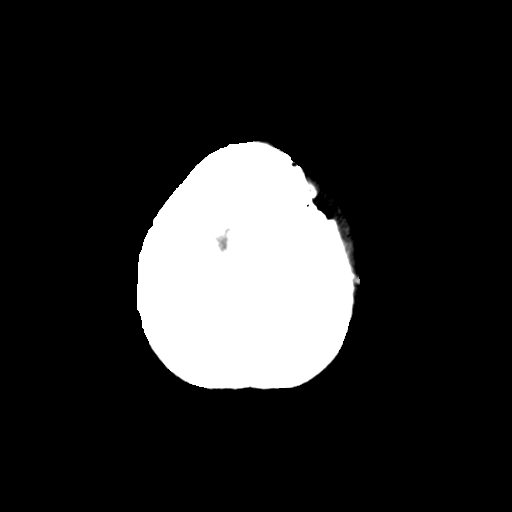

[Series 3: head 5.00 hr40 s3 ax · axial · 0.34mm/px · z∈[-563,-548]mm · 2 of 30 slices shown]
[im 4/30  brain]
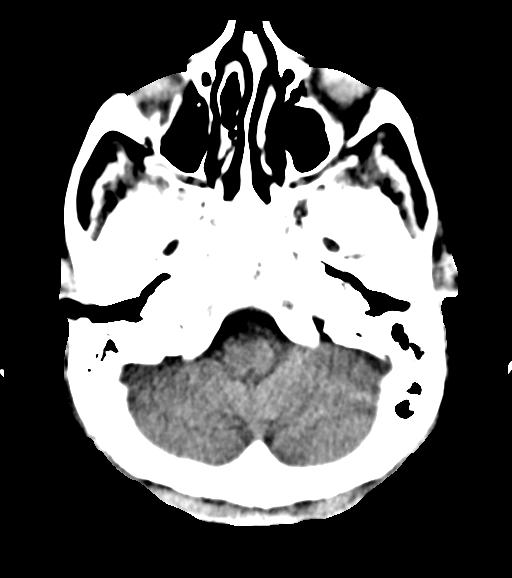
[im 7/30  brain]
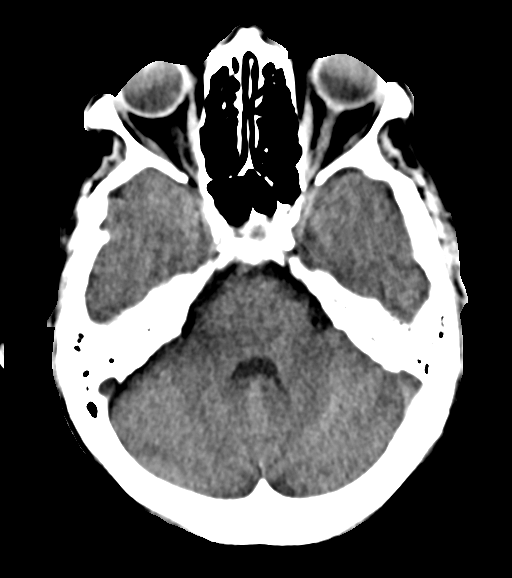

[Series 7: head 3.00 hr40 s3 cor · coronal · 0.31mm/px · 3 of 65 slices shown]
[im 22/65  brain]
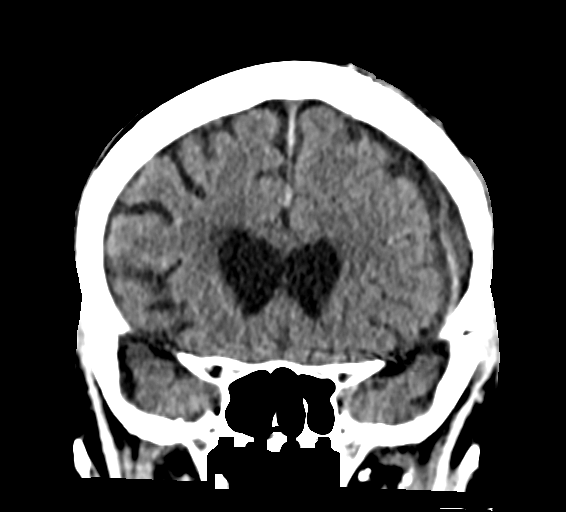
[im 29/65  brain]
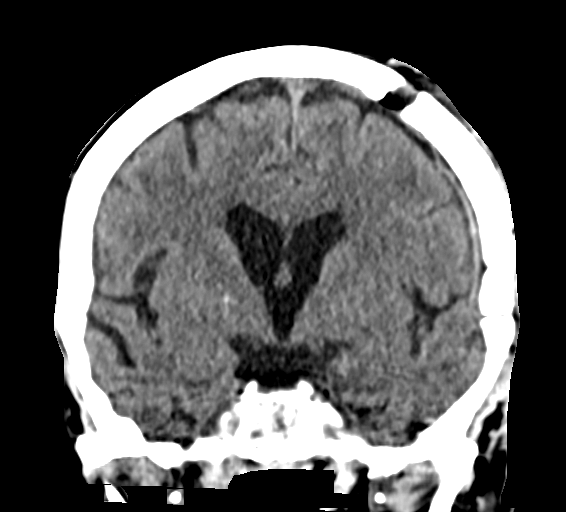
[im 36/65  brain]
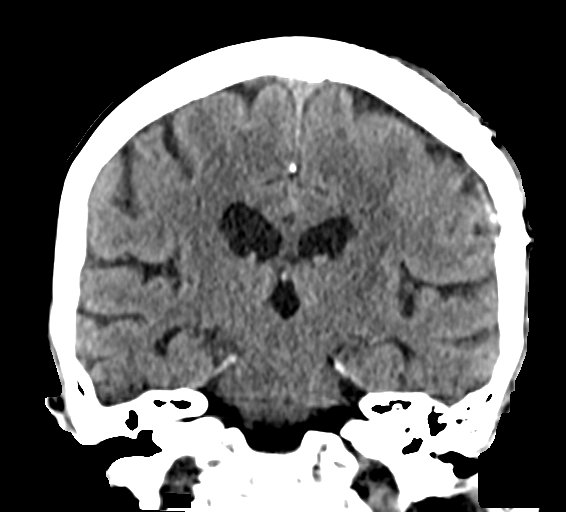

[Series 9: head 3.00 hr40 s3 sag · sagittal · 0.31mm/px · 3 of 57 slices shown]
[im 19/57  brain]
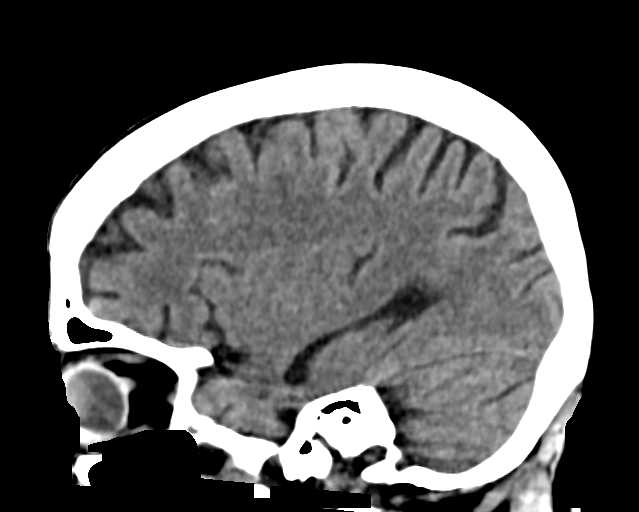
[im 29/57  brain]
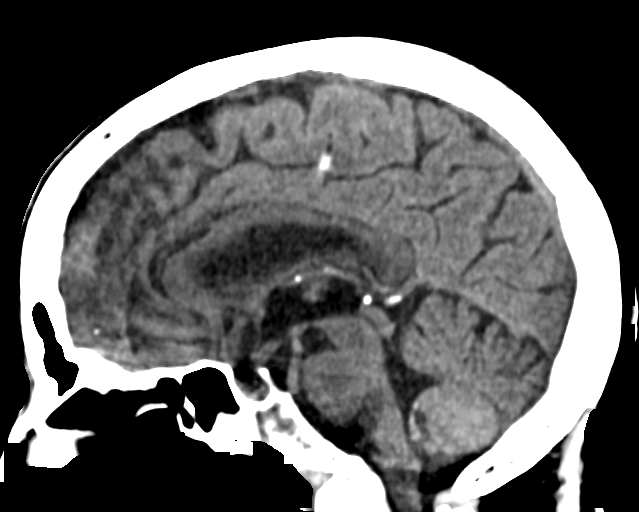
[im 38/57  brain]
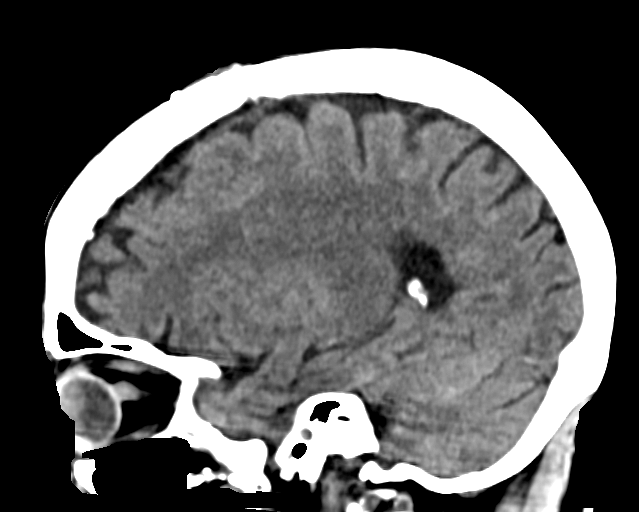

[16 of 47 positions shown; findings below may reference images not displayed]

FINDINGS: Brain: Generalized atrophy. No midline shift or mass effect.
Decreased size of previously identified LEFT frontoparietal subdural
hematoma now 6 mm thick previously 8 mm. Less high attenuation
hemorrhagic component is now identified. No intraparenchymal
hemorrhage, mass lesion or evidence of acute infarction. No new
extra-axial fluid collections.

Vascular: No hyperdense vessels. Atherosclerotic calcification of
internal carotid arteries bilaterally at skull base

Skull: Prior LEFT frontoparietal craniotomy.

Sinuses/Orbits: Partial opacification of RIGHT mastoid air cells by
fluid. Mucosal thickening RIGHT sphenoid sinus.

Other: N/A
IMPRESSION: Interval slight decrease in size of previously identified LEFT
frontoparietal subdural hematoma with evidence of prior craniotomy.

No new intracranial abnormalities.

## 2018-05-30 ENCOUNTER — Other Ambulatory Visit: Payer: Self-pay | Admitting: *Deleted

## 2018-05-30 ENCOUNTER — Encounter: Payer: Self-pay | Admitting: *Deleted

## 2018-05-30 DIAGNOSIS — H2513 Age-related nuclear cataract, bilateral: Secondary | ICD-10-CM | POA: Diagnosis not present

## 2018-05-30 DIAGNOSIS — N2581 Secondary hyperparathyroidism of renal origin: Secondary | ICD-10-CM | POA: Diagnosis not present

## 2018-05-30 DIAGNOSIS — N186 End stage renal disease: Secondary | ICD-10-CM | POA: Diagnosis not present

## 2018-05-30 DIAGNOSIS — E876 Hypokalemia: Secondary | ICD-10-CM | POA: Diagnosis not present

## 2018-05-30 DIAGNOSIS — E1122 Type 2 diabetes mellitus with diabetic chronic kidney disease: Secondary | ICD-10-CM | POA: Diagnosis not present

## 2018-05-30 DIAGNOSIS — M0000 Staphylococcal arthritis, unspecified joint: Secondary | ICD-10-CM | POA: Diagnosis not present

## 2018-05-30 DIAGNOSIS — D631 Anemia in chronic kidney disease: Secondary | ICD-10-CM | POA: Diagnosis not present

## 2018-05-30 NOTE — Patient Outreach (Signed)
[facility]) Care Management  05/30/2018  Destiny Day October 07, 1950 [id number]   CSW was able to make initial contact with patient today to perform phone assessment, as well as assess and assist with social work needs and services.  CSW introduced self, explained role and types of services provided through [facility] [facility] ([place] [facility]).  CSW further explained to patient that CSW works with Merlene Morse Minor, also with [place] [facility].  CSW then explained the reason for the call, indicating that Mrs. Minor thought that patient would benefit from social work services and resources to assist with providing financial resources, as well as arranging for in-home care services for patient.  CSW obtained two HIPAA compliant identifiers from patient, which included patient's name and date of birth. CSW is aware that patient currently resides at [facility] in [place], [place] where she is receiving short-term rehabilitative services, prior to returning home to live with her husband, Destiny Day.  CSW was able to speak extensively with patient, while she was receiving dialysis treatment, to address all her questions and concerns regarding her current financial situation.  CSW inquired about patient applying for Adult Medicaid through the [place], for which she declined, indicating that it has not even been a year since she and Mr. Rabinovich last applied, but were denied.  Patient stated, "They told us we made $1.00 too much to be eligible". CSW agreed to have Daneen Schick, social work Social worker, also with Scientist, clinical (histocompatibility and immunogenetics), mail patient a Engineer, maintenance (IT).  In addition, Mrs. Peggye Ley has also been asked to mail patient a [place] application to see if she qualifies to have a percentage of her medical  expenses waived.  Patient has been given the contact information for the billing office at [facility] and has been instructed to request a copy of all her medical expenses.  Patient has also been given a list of information that she will need to submit with her application for processing.  Patient indicated that she and Mr. Tonnesen will begin working on gathering all the necessary information, once she returns home. Patient reported that she recently had a plan of care meeting at [facility], but that it has not yet been determined when she will be able to return home.  Home Health arrangements have already been made for patient through [facility], in the form of a nurse, physical therapist, occupational therapist and an aide, courtesy of the discharge planning coordinator at [place].  Patient reported that she would like to also receive in-home care services, but realizes that this would be an out-of-pocket expense, for which they are unable to afford.  CSW agreed to have Mrs. Humble mail patient a list of in-home care agencies, just in case.  Mrs. Peggye Ley will then follow-up with patient next week to ensure that she received the packet of resource information, as well as answer any questions that she may have at that time. CSW will perform a case closure on patient, as all goals of treatment have been met from social work standpoint and no additional social work needs have been identified at this time.  CSW will notify patient's RNCM with [place] [facility], Imelda Pillow of CSW's plans to close patient's case.  CSW will ensure that Ms. Zigmund Daniel is aware of Mrs. Humble's social work involvement.  Patient is aware that she will be receiving  a transition of care call from Ms. Zigmund Daniel upon her return home.  CSW will fax an update to patient's Primary Care Physician, Dr. Velna Hatchet to ensure that they are aware of CSW's involvement with patient's plan of care.   Nat Christen, BSW, MSW, LCSW   Licensed Education officer, environmental Health System  Mailing [place] [address], [place], [place] [address] Physical Address-[address], [place], [place] [address] Toll Free Main # [phone] Fax # [phone] Cell # [phone]  Office # [phone] [email or web address]

## 2018-05-30 NOTE — Patient Outreach (Signed)
[facility] [facility]) Care Management  05/30/2018  NATALEY BAHRI [birth year] [id number]  BSW mailed patient community resources as requested by [place]. BSW to outreach the patient in the next two weeks to confirm receipt of mailings and assist with resource linking as needed.  Daneen Schick, BSW, CDP [facility] [phone]

## 2018-06-01 DIAGNOSIS — M0000 Staphylococcal arthritis, unspecified joint: Secondary | ICD-10-CM | POA: Diagnosis not present

## 2018-06-01 DIAGNOSIS — E876 Hypokalemia: Secondary | ICD-10-CM | POA: Diagnosis not present

## 2018-06-01 DIAGNOSIS — N186 End stage renal disease: Secondary | ICD-10-CM | POA: Diagnosis not present

## 2018-06-01 DIAGNOSIS — D631 Anemia in chronic kidney disease: Secondary | ICD-10-CM | POA: Diagnosis not present

## 2018-06-01 DIAGNOSIS — N2581 Secondary hyperparathyroidism of renal origin: Secondary | ICD-10-CM | POA: Diagnosis not present

## 2018-06-01 DIAGNOSIS — E1122 Type 2 diabetes mellitus with diabetic chronic kidney disease: Secondary | ICD-10-CM | POA: Diagnosis not present

## 2018-06-02 ENCOUNTER — Inpatient Hospital Stay: Payer: Medicare Other | Admitting: Family

## 2018-06-03 DIAGNOSIS — E876 Hypokalemia: Secondary | ICD-10-CM | POA: Diagnosis not present

## 2018-06-03 DIAGNOSIS — N186 End stage renal disease: Secondary | ICD-10-CM | POA: Diagnosis not present

## 2018-06-03 DIAGNOSIS — M0000 Staphylococcal arthritis, unspecified joint: Secondary | ICD-10-CM | POA: Diagnosis not present

## 2018-06-03 DIAGNOSIS — N2581 Secondary hyperparathyroidism of renal origin: Secondary | ICD-10-CM | POA: Diagnosis not present

## 2018-06-03 DIAGNOSIS — E1122 Type 2 diabetes mellitus with diabetic chronic kidney disease: Secondary | ICD-10-CM | POA: Diagnosis not present

## 2018-06-03 DIAGNOSIS — D631 Anemia in chronic kidney disease: Secondary | ICD-10-CM | POA: Diagnosis not present

## 2018-06-06 DIAGNOSIS — N2581 Secondary hyperparathyroidism of renal origin: Secondary | ICD-10-CM | POA: Diagnosis not present

## 2018-06-06 DIAGNOSIS — M0000 Staphylococcal arthritis, unspecified joint: Secondary | ICD-10-CM | POA: Diagnosis not present

## 2018-06-06 DIAGNOSIS — N186 End stage renal disease: Secondary | ICD-10-CM | POA: Diagnosis not present

## 2018-06-06 DIAGNOSIS — D631 Anemia in chronic kidney disease: Secondary | ICD-10-CM | POA: Diagnosis not present

## 2018-06-06 DIAGNOSIS — E876 Hypokalemia: Secondary | ICD-10-CM | POA: Diagnosis not present

## 2018-06-06 DIAGNOSIS — E1122 Type 2 diabetes mellitus with diabetic chronic kidney disease: Secondary | ICD-10-CM | POA: Diagnosis not present

## 2018-06-07 ENCOUNTER — Other Ambulatory Visit: Payer: Self-pay | Admitting: *Deleted

## 2018-06-07 ENCOUNTER — Other Ambulatory Visit (INDEPENDENT_AMBULATORY_CARE_PROVIDER_SITE_OTHER): Payer: Self-pay

## 2018-06-07 ENCOUNTER — Encounter (INDEPENDENT_AMBULATORY_CARE_PROVIDER_SITE_OTHER): Payer: Self-pay | Admitting: Physician Assistant

## 2018-06-07 ENCOUNTER — Ambulatory Visit (INDEPENDENT_AMBULATORY_CARE_PROVIDER_SITE_OTHER): Payer: Medicare Other | Admitting: Physician Assistant

## 2018-06-07 ENCOUNTER — Other Ambulatory Visit (INDEPENDENT_AMBULATORY_CARE_PROVIDER_SITE_OTHER): Payer: Self-pay | Admitting: Orthopedic Surgery

## 2018-06-07 VITALS — Ht 65.0 in | Wt 137.1 lb

## 2018-06-07 DIAGNOSIS — E785 Hyperlipidemia, unspecified: Secondary | ICD-10-CM | POA: Diagnosis not present

## 2018-06-07 DIAGNOSIS — F418 Other specified anxiety disorders: Secondary | ICD-10-CM | POA: Diagnosis not present

## 2018-06-07 DIAGNOSIS — J449 Chronic obstructive pulmonary disease, unspecified: Secondary | ICD-10-CM | POA: Diagnosis not present

## 2018-06-07 DIAGNOSIS — K219 Gastro-esophageal reflux disease without esophagitis: Secondary | ICD-10-CM | POA: Diagnosis not present

## 2018-06-07 DIAGNOSIS — Z471 Aftercare following joint replacement surgery: Secondary | ICD-10-CM | POA: Diagnosis not present

## 2018-06-07 DIAGNOSIS — Z9181 History of falling: Secondary | ICD-10-CM | POA: Diagnosis not present

## 2018-06-07 DIAGNOSIS — Z9889 Other specified postprocedural states: Secondary | ICD-10-CM

## 2018-06-07 DIAGNOSIS — I120 Hypertensive chronic kidney disease with stage 5 chronic kidney disease or end stage renal disease: Secondary | ICD-10-CM | POA: Diagnosis not present

## 2018-06-07 DIAGNOSIS — Z7982 Long term (current) use of aspirin: Secondary | ICD-10-CM | POA: Diagnosis not present

## 2018-06-07 DIAGNOSIS — E7849 Other hyperlipidemia: Secondary | ICD-10-CM | POA: Diagnosis not present

## 2018-06-07 DIAGNOSIS — E1122 Type 2 diabetes mellitus with diabetic chronic kidney disease: Secondary | ICD-10-CM | POA: Diagnosis not present

## 2018-06-07 DIAGNOSIS — M009 Pyogenic arthritis, unspecified: Secondary | ICD-10-CM

## 2018-06-07 DIAGNOSIS — N186 End stage renal disease: Secondary | ICD-10-CM | POA: Diagnosis not present

## 2018-06-07 DIAGNOSIS — Z96651 Presence of right artificial knee joint: Secondary | ICD-10-CM | POA: Diagnosis not present

## 2018-06-07 DIAGNOSIS — E1151 Type 2 diabetes mellitus with diabetic peripheral angiopathy without gangrene: Secondary | ICD-10-CM | POA: Diagnosis not present

## 2018-06-07 DIAGNOSIS — Z794 Long term (current) use of insulin: Secondary | ICD-10-CM | POA: Diagnosis not present

## 2018-06-07 DIAGNOSIS — Z7902 Long term (current) use of antithrombotics/antiplatelets: Secondary | ICD-10-CM | POA: Diagnosis not present

## 2018-06-07 DIAGNOSIS — F419 Anxiety disorder, unspecified: Secondary | ICD-10-CM | POA: Diagnosis not present

## 2018-06-07 DIAGNOSIS — Z992 Dependence on renal dialysis: Secondary | ICD-10-CM | POA: Diagnosis not present

## 2018-06-07 MED ORDER — OXYCODONE-ACETAMINOPHEN 5-325 MG PO TABS: 1.0000 | ORAL_TABLET | ORAL | 0 refills | Status: DC | PRN

## 2018-06-07 NOTE — Progress Notes (Signed)
Culture

## 2018-06-07 NOTE — Patient Outreach (Signed)
[facility]) Care Management  06/07/2018  Destiny Day 22-Apr-1951 [id number]    Transition of care (initial attempt)  RN spoke with pt and confirmed identifiers with introductions to the [facility] services. Pt states she had her first visit with [place] today and will be receiving PT/OT in the home.  RN inquired further if this was a good time to talk about her needs. Pt states she was on a doctor's visit and requested a call back later today. RN will follow up later today as requested.  Raina Mina, RN Care Management Coordinator [place] Office [phone]

## 2018-06-07 NOTE — Progress Notes (Signed)
Office Visit Note   Patient: Destiny Day           Date of Birth: Nov 12, 1950           MRN: [id number] Visit Date: 06/07/2018              Requested by: Velna Hatchet, MD [address] [place], [place] [address] PCP: Velna Hatchet, MD  Chief Complaint  Patient presents with  . Right Leg - Routine Post Op    Right TKA      HPI: Patient is a 67 year old woman end-stage renal disease on dialysis is currently receiving Kefzol with her dialysis.  Patient states that her knee pain has not resolved since surgery she has had no acute changes she has pain with range of motion complains of swelling denies any fever chills or systemic symptoms.  Patient is currently been discharged from skilled nursing.  Assessment & Plan: Visit Diagnoses:  1. Status post arthroscopy of right knee   2. Pyogenic arthritis of right knee joint, due to unspecified organism [facility])     Plan: The knee was aspirated this was bloody with patient's persistent effusion and symptoms I am concerned for possible recurrent infection.  Aspirate was sent for cultures plan for surgery Friday morning at 730 so she can proceed to dialysis at 55 as an outpatient dialysis.  Continue with for the methicillin sensitive staph.  Follow-Up Instructions: Return in about 2 weeks (around 06/21/2018).   Ortho Exam  Patient is alert, oriented, no adenopathy, well-dressed, normal affect, normal respiratory effort. Examination the ulcer over the knee has healed there is no fluctuance no tenderness to palpation over the prepatellar ulcer.  She does have an effusion of the right knee.  She has pain with range of motion.  After informed consent sterile prepping the knee was aspirated from the superior lateral portal could only obtain about 2 cc this was bloody this is sent for cultures with the pain and persistent effusion I am concerned for possible recurrent infection despite initial debridement IV antibiotics.  Imaging: No  results found. No images are attached to the encounter.  Labs: Lab Results  Component Value Date   HGBA1C 7.2 (H) 05/06/2018   HGBA1C 6.4 (H) 12/31/2017   HGBA1C 5.0 09/03/2017   REPTSTATUS 05/12/2018 FINAL 05/07/2018   GRAMSTAIN  05/06/2018    MODERATE WBC PRESENT, PREDOMINANTLY PMN NO ORGANISMS SEEN    CULT  05/07/2018    NO GROWTH 5 DAYS Performed at [facility], [place] [address] [place], [place] [address]    [place] 05/06/2018     Lab Results  Component Value Date   ALBUMIN 2.2 (L) 05/09/2018   ALBUMIN 2.4 (L) 05/07/2018   ALBUMIN 2.6 (L) 05/06/2018    Body mass index is 22.82 kg/m.  Orders:  Orders Placed This Encounter  Procedures  . Body Fluid Culture   Meds ordered this encounter  Medications  . oxyCODONE-acetaminophen (PERCOCET/ROXICET) 5-325 MG tablet    Sig: Take 1 tablet by mouth every 4 (four) hours as needed for severe pain.    Dispense:  40 tablet    Refill:  0     Procedures: Large Joint Inj: R knee on 06/07/2018 2:04 PM Indications: pain and diagnostic evaluation Details: 18 G 1.5 in needle, superolateral approach  Arthrogram: No  Medications: 1 mL lidocaine 1 % Aspirate: 2 mL bloody Outcome: tolerated well, no immediate complications Procedure, treatment alternatives, risks and benefits explained, specific risks discussed. Consent  was given by the patient. Immediately prior to procedure a time out was called to verify the correct patient, procedure, equipment, support staff and site/side marked as required. Patient was prepped and draped in the usual sterile fashion.      Clinical Data: No additional findings.  ROS:  All other systems negative, except as noted in the HPI. Review of Systems  Objective: Vital Signs: Ht 5\' 5"  (1.651 m)   Wt 137 lb 2.1 oz (62.2 kg)   BMI 22.82 kg/m   Specialty Comments:  No specialty comments available.  PMFS History: Patient Active Problem List   Diagnosis Date  Noted  . Septic arthritis of knee, right ([place]) 05/06/2018  . Degenerative tear of posterior horn of medial meniscus, right   . Old complex tear of lateral meniscus of right knee   . Loose body in knee, right knee   . Acute pulmonary edema ([place]) 02/21/2018  . 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[place]) 02/03/2018  . End-stage renal disease on hemodialysis ([place])   . Respiratory failure ([place]) 01/21/2018  . Achilles tendon contracture, right 11/09/2017  . Labile blood glucose   . Anemia of infection and chronic disease   . Essential hypertension   . Black stool   . Right sided weakness   . Subdural hemorrhage following injury ([place]) 08/30/2017  . Diabetic peripheral neuropathy ([place])   . Chronic obstructive pulmonary disease ([place])   . Seizure prophylaxis   . Subdural hematoma ([place]) 08/24/2017  . Traumatic subdural hematoma ([place]) 08/23/2017  . Fall   . SDH (subdural hematoma) ([place])   . Acute respiratory failure with hypoxia ([place])   . Diabetes mellitus type 2 in nonobese (HCC)   . Leukocytosis   . Labile blood pressure   . Generalized anxiety disorder   . Debility 08/16/2017  . Amputated great toe of right foot ([place])   . Amputated toe of left foot ([place])   . Post-operative pain   . ESRD on dialysis ([place])   . Diabetes mellitus ([place])   . Anxiety state   . Benign essential HTN   . Acute blood loss anemia   . Anemia of chronic disease   . Idiopathic chronic venous hypertension of both lower extremities with inflammation 10/13/2016  . S/P transmetatarsal amputation of foot, left ([place]) 09/21/2016  . Onychomycosis 08/15/2016  . PAD (peripheral artery disease) ([place]) 07/08/2016  . Atherosclerosis of native artery of left lower extremity with gangrene ([place]) 06/23/2016  . GERD (gastroesophageal reflux disease) 10/07/2015  . ARF (acute renal failure) ([place])   . Hypothermia 06/12/2015  . Acute on chronic renal failure ([place]) 06/12/2015  . CHF (congestive heart failure) ([place])  07/30/2014  . CKD (chronic kidney disease) stage 4, GFR 15-29 ml/min (HCC) 06/27/2014  . Hypertensive heart disease 05/25/2013  . CKD (chronic kidney disease) stage 3, GFR 30-59 ml/min (HCC) 05/25/2013  . Pulmonary edema 05/23/2013  . Type 2 diabetes mellitus with diabetic nephropathy ([place]) 05/23/2013  . Type 2 diabetes mellitus with hyperosmolar nonketotic hyperglycemia ([place]) 04/13/2013  . Chronic diastolic CHF (congestive heart failure) ([place]) 04/13/2013  . UGI bleed 03/31/2013  . Hypokalemia 08/24/2012  . Type II or unspecified type diabetes mellitus without mention of complication, not stated as uncontrolled 12/27/2007  . HLD (hyperlipidemia) 12/27/2007  . ALLERGIC RHINITIS 12/27/2007  . Asthma 12/27/2007   Past Medical History:  Diagnosis Date  . Abdominal bruit   . Anemia   . Anxiety   . Arthritis    Osteoarthritis  .  Asthma   . Cervical disc disease    "pinced nerve"  . CHF (congestive heart failure) ([place])   . Chronic kidney disease    dialysis - M/W/F  . Complication of anesthesia    " after I got home from my last procedure, I started itching."  . Diabetes mellitus    Type II  . Diverticulitis   . ESRD on peritoneal dialysis [facility])    Hemodialysis - MWF- [place]   . GERD (gastroesophageal reflux disease)    from medications  . GI bleed 03/31/2013  . History of hiatal hernia   . Hyperlipidemia   . Hypertension   . Neuropathy    left leg  . Osteoporosis   . Peripheral vascular disease ([place])   . Pneumonia    "very young" and a few years ago  . Seasonal allergies   . Shortness of breath dyspnea    WIth exertion  . Sleep apnea    can't afford cpap    Family History  Problem Relation Age of Onset  . Other Mother        not sure of cause of death  . Diabetes Father   . Pancreatic cancer Maternal Grandmother   . Colon cancer Neg Hx     Past Surgical History:  Procedure Laterality Date  . A/V SHUNTOGRAM N/A 09/22/2016   Procedure: A/V Shuntogram - left arm;   Surgeon: Serafina Mitchell, MD;  Location: [place] [facility];  Service: Cardiovascular;  Laterality: N/A;  . A/V SHUNTOGRAM N/A 03/22/2018   Procedure: A/V SHUNTOGRAM - left arm;  Surgeon: Serafina Mitchell, MD;  Location: [place] [facility];  Service: Cardiovascular;  Laterality: N/A;  . ABDOMINAL HYSTERECTOMY  1993`  . AMPUTATION Left 09/01/2016   Procedure: LEFT FOOT TRANSMETATARSAL AMPUTATION;  Surgeon: Newt Minion, MD;  Location: [place];  Service: Orthopedics;  Laterality: Left;  . AMPUTATION Right 08/11/2017   Procedure: RIGHT GREAT TOE AMPUTATION DIGIT;  Surgeon: Rosetta Posner, MD;  Location: [facility];  Service: Vascular;  Laterality: Right;  . AMPUTATION Right 12/31/2017   Procedure: RIGHT TRANSMETATARSAL AMPUTATION;  Surgeon: Newt Minion, MD;  Location: [place];  Service: Orthopedics;  Laterality: Right;  . AV FISTULA PLACEMENT Left 04/21/2016   Procedure: INSERTION OF ARTERIOVENOUS (AV) GORE-TEX GRAFT ARM LEFT;  Surgeon: Elam Dutch, MD;  Location: [place];  Service: Vascular;  Laterality: Left;  . [place] TRANSPOSITION Left 07/10/2014   Procedure: BASCILIC VEIN TRANSPOSITION;  Surgeon: Angelia Mould, MD;  Location: [place];  Service: Vascular;  Laterality: Left;  . BASCILIC VEIN TRANSPOSITION Right 11/08/2014   Procedure: FIRST STAGE BASILIC VEIN TRANSPOSITION;  Surgeon: Angelia Mould, MD;  Location: [facility];  Service: Vascular;  Laterality: Right;  . BASCILIC VEIN TRANSPOSITION Right 01/18/2015   Procedure: SECOND STAGE BASILIC VEIN TRANSPOSITION;  Surgeon: Angelia Mould, MD;  Location: [place];  Service: Vascular;  Laterality: Right;  . CRANIOTOMY N/A 08/23/2017   Procedure: CRANIOTOMY HEMATOMA EVACUATION SUBDURAL;  Surgeon: Ashok Pall, MD;  Location: [place];  Service: Neurosurgery;  Laterality: N/A;  . CRANIOTOMY Left 08/24/2017   Procedure: CRANIOTOMY FOR RECURRENT ACUTE SUBDURAL HEMATOMA;  Surgeon: Ashok Pall, MD;  Location: [place];  Service: Neurosurgery;   Laterality: Left;  . ESOPHAGOGASTRODUODENOSCOPY N/A 03/31/2013   Procedure: ESOPHAGOGASTRODUODENOSCOPY (EGD);  Surgeon: Gatha Mayer, MD;  Location: [facility] ENDOSCOPY;  Service: Endoscopy;  Laterality: N/A;  . EYE SURGERY     laser surgery  .  FEMORAL-POPLITEAL BYPASS GRAFT Left 07/08/2016   Procedure: LEFT  FEMORAL-BELOW KNEE POPLITEAL ARTERY BYPASS GRAFT USING 6MM X 80 CM PROPATEN GORETEX GRAFT WITH RINGS.;  Surgeon: Rosetta Posner, MD;  Location: [facility];  Service: Vascular;  Laterality: Left;  . FEMORAL-POPLITEAL BYPASS GRAFT Right 08/11/2017   Procedure: RIGHT FEMORAL TO BELOW KNEE POPLITEAL ARTERKY  BYPASS GRAFT USING 6MM RINGED PROPATEN GRAFT;  Surgeon: Rosetta Posner, MD;  Location: [place];  Service: Vascular;  Laterality: Right;  . FISTULOGRAM Left 10/29/2014   Procedure: FISTULOGRAM;  Surgeon: Angelia Mould, MD;  Location: [facility] LAB;  Service: Cardiovascular;  Laterality: Left;  . KNEE ARTHROSCOPY Right 05/06/2018   Procedure: RIGHT KNEE ARTHROSCOPY;  Surgeon: Newt Minion, MD;  Location: [place];  Service: Orthopedics;  Laterality: Right;  . LIGATION OF ARTERIOVENOUS  FISTULA Left 04/21/2016   Procedure: LIGATION OF ARTERIOVENOUS  FISTULA LEFT ARM;  Surgeon: Elam Dutch, MD;  Location: [place];  Service: Vascular;  Laterality: Left;  . LOWER EXTREMITY ANGIOGRAPHY N/A 04/06/2017   Procedure: Lower Extremity Angiography - Right;  Surgeon: Serafina Mitchell, MD;  Location: [place] [facility];  Service: Cardiovascular;  Laterality: N/A;  . PATCH ANGIOPLASTY Right 01/18/2015   Procedure: BASILIC VEIN PATCH ANGIOPLASTY USING VASCUGUARD PATCH;  Surgeon: Angelia Mould, MD;  Location: [facility];  Service: Vascular;  Laterality: Right;  . PERIPHERAL VASCULAR BALLOON ANGIOPLASTY  09/22/2016   Procedure: Peripheral Vascular Balloon Angioplasty;  Surgeon: Serafina Mitchell, MD;  Location: [place] [facility];  Service: Cardiovascular;;  Lt. Fistula  . PERIPHERAL VASCULAR BALLOON ANGIOPLASTY Left  03/22/2018   Procedure: PERIPHERAL VASCULAR BALLOON ANGIOPLASTY;  Surgeon: Serafina Mitchell, MD;  Location: [place] [facility];  Service: Cardiovascular;  Laterality: Left;  Arm shunt  . PERIPHERAL VASCULAR CATHETERIZATION N/A 06/23/2016   Procedure: Abdominal Aortogram w/Lower Extremity;  Surgeon: Serafina Mitchell, MD;  Location: [place] [facility];  Service: Cardiovascular;  Laterality: N/A;  . PERIPHERAL VASCULAR CATHETERIZATION  06/23/2016   Procedure: Peripheral Vascular Intervention;  Surgeon: Serafina Mitchell, MD;  Location: [facility] [facility];  Service: Cardiovascular;;  lt common and external illiac artery   Social History   Occupational History  . Occupation: Retired  Tobacco Use  . Smoking status: Former Smoker    Packs/day: 0.35    Years: 40.00    Pack years: 14.00    Types: Cigarettes    Last attempt to quit: 05/10/2012    Years since quitting: 6.0  . Smokeless tobacco: Never Used  Substance and Sexual Activity  . Alcohol use: No  . Drug use: No    Comment: marijuana; quit in early 1980's  . Sexual activity: Yes

## 2018-06-08 ENCOUNTER — Ambulatory Visit (INDEPENDENT_AMBULATORY_CARE_PROVIDER_SITE_OTHER): Payer: Self-pay | Admitting: Physician Assistant

## 2018-06-08 DIAGNOSIS — D631 Anemia in chronic kidney disease: Secondary | ICD-10-CM | POA: Diagnosis not present

## 2018-06-08 DIAGNOSIS — M0000 Staphylococcal arthritis, unspecified joint: Secondary | ICD-10-CM | POA: Diagnosis not present

## 2018-06-08 DIAGNOSIS — E876 Hypokalemia: Secondary | ICD-10-CM | POA: Diagnosis not present

## 2018-06-08 DIAGNOSIS — E1122 Type 2 diabetes mellitus with diabetic chronic kidney disease: Secondary | ICD-10-CM | POA: Diagnosis not present

## 2018-06-08 DIAGNOSIS — N186 End stage renal disease: Secondary | ICD-10-CM | POA: Diagnosis not present

## 2018-06-08 DIAGNOSIS — N2581 Secondary hyperparathyroidism of renal origin: Secondary | ICD-10-CM | POA: Diagnosis not present

## 2018-06-08 MED ORDER — LIDOCAINE HCL 1 % IJ SOLN
1.0000 mL | INTRAMUSCULAR | Status: AC | PRN
  Administered 2018-06-07: 1 mL

## 2018-06-09 ENCOUNTER — Encounter (HOSPITAL_COMMUNITY): Payer: Self-pay | Admitting: *Deleted

## 2018-06-09 ENCOUNTER — Other Ambulatory Visit: Payer: Self-pay

## 2018-06-09 DIAGNOSIS — I1311 Hypertensive heart and chronic kidney disease without heart failure, with stage 5 chronic kidney disease, or end stage renal disease: Secondary | ICD-10-CM | POA: Diagnosis not present

## 2018-06-09 DIAGNOSIS — N186 End stage renal disease: Secondary | ICD-10-CM | POA: Diagnosis not present

## 2018-06-09 DIAGNOSIS — E1322 Other specified diabetes mellitus with diabetic chronic kidney disease: Secondary | ICD-10-CM | POA: Diagnosis not present

## 2018-06-09 DIAGNOSIS — Z992 Dependence on renal dialysis: Secondary | ICD-10-CM | POA: Diagnosis not present

## 2018-06-09 NOTE — Anesthesia Preprocedure Evaluation (Addendum)
Anesthesia Evaluation  Patient identified by MRN, date of birth, ID band Patient awake    Reviewed: Allergy & Precautions, NPO status , Patient's Chart, lab work & pertinent test results  History of Anesthesia Complications (+) PONVNegative for: history of anesthetic complications  Airway Mallampati: III  TM Distance: >3 FB Neck ROM: Full    Dental  (+) Edentulous Upper, Poor Dentition   Pulmonary asthma , sleep apnea , COPD, former smoker,    Pulmonary exam normal        Cardiovascular hypertension, Pt. on medications and Pt. on home beta blockers + Peripheral Vascular Disease and +CHF (diastolic)  Normal cardiovascular exam  TTE 01/23/18: severe LVH, EF 55-60%, grade 2 DD, mild pulmonary HTN (PAP 44 mmHg), no significant valve abnormality   Neuro/Psych H/o subdural hematoma s/p crani (Jan 2019) negative psych ROS   GI/Hepatic Neg liver ROS, hiatal hernia, GERD  ,  Endo/Other  negative endocrine ROSdiabetes, Insulin Dependent  Renal/GU ESRF and DialysisRenal disease  negative genitourinary   Musculoskeletal negative musculoskeletal ROS (+)   Abdominal   Peds  Hematology negative hematology ROS (+)   Anesthesia Other Findings   Reproductive/Obstetrics                            Anesthesia Physical Anesthesia Plan  ASA: IV  Anesthesia Plan: General   Post-op Pain Management:    Induction: Intravenous  PONV Risk Score and Plan: 4 or greater and Ondansetron, Dexamethasone and Treatment may vary due to age or medical condition  Airway Management Planned: LMA  Additional Equipment: None  Intra-op Plan:   Post-operative Plan: Extubation in OR  Informed Consent: I have reviewed the patients History and Physical, chart, labs and discussed the procedure including the risks, benefits and alternatives for the proposed anesthesia with the patient or authorized representative who has  indicated his/her understanding and acceptance.     Plan Discussed with:   Anesthesia Plan Comments:        Anesthesia Quick Evaluation

## 2018-06-09 NOTE — Patient Outreach (Signed)
[facility]) Care Management  06/09/2018  Destiny Day [birth year] [id number]  Successful outreach to the patient on today's date in efforts to confirm receipt of mailed resources. HIPAA identifiers confirmed. The patient is unable to confirm receipt of resources stating "I just got home Sunday and I will have to check with my husband". The patient reports she has a scheduled surgery planned for Friday 11/16 stating "I have my knee done again". The patient denies plans for short-term rehab. The patient is agreeable to BSW placing an outreach call next week to follow up on patient needs. The patient has this BSW's contact number if assistance is needed prior to the next scheduled call.  Daneen Schick, BSW, CDP [facility] [phone]

## 2018-06-09 NOTE — Progress Notes (Signed)
Destiny Day denies chest pain or shortness of breath.  Patient has Type II Diabetes, she reports that last A1C was 6.0..  I instructed patient to take 5 Units of Levemir tonight. I instructed patient to check CBG after awaking and every 2 hours until arrival  to the hospital.  I Instructed patient if CBG is less than 70 to drink 1/2 cup of a clear juice. Recheck CBG in 15 minutes then call pre- op desk at [phone] for further instructions. If scheduled to receive Insulin, do not take Insulin.   If CBG > 220 take 1/2 of SS Dose.

## 2018-06-10 ENCOUNTER — Encounter (HOSPITAL_COMMUNITY)
Admission: RE | Disposition: A | Discharge status: Home / Self Care | Payer: Self-pay | Source: Ambulatory Visit | Attending: Orthopedic Surgery

## 2018-06-10 ENCOUNTER — Ambulatory Visit (HOSPITAL_COMMUNITY)
Admission: RE | Admit: 2018-06-10 | Discharge: 2018-06-11 | Disposition: A | Discharge status: Home / Self Care | Payer: Medicare Other | Source: Ambulatory Visit | Attending: Orthopedic Surgery | Admitting: Orthopedic Surgery

## 2018-06-10 ENCOUNTER — Ambulatory Visit (HOSPITAL_COMMUNITY): Payer: Medicare Other | Admitting: Anesthesiology

## 2018-06-10 ENCOUNTER — Other Ambulatory Visit: Payer: Self-pay

## 2018-06-10 ENCOUNTER — Encounter (HOSPITAL_COMMUNITY): Payer: Self-pay | Admitting: General Practice

## 2018-06-10 DIAGNOSIS — Z992 Dependence on renal dialysis: Principal | ICD-10-CM

## 2018-06-10 DIAGNOSIS — I5032 Chronic diastolic (congestive) heart failure: Secondary | ICD-10-CM | POA: Diagnosis not present

## 2018-06-10 DIAGNOSIS — Z7951 Long term (current) use of inhaled steroids: Secondary | ICD-10-CM | POA: Insufficient documentation

## 2018-06-10 DIAGNOSIS — J45909 Unspecified asthma, uncomplicated: Secondary | ICD-10-CM | POA: Insufficient documentation

## 2018-06-10 DIAGNOSIS — Z89411 Acquired absence of right great toe: Secondary | ICD-10-CM | POA: Insufficient documentation

## 2018-06-10 DIAGNOSIS — Z7982 Long term (current) use of aspirin: Secondary | ICD-10-CM | POA: Insufficient documentation

## 2018-06-10 DIAGNOSIS — K219 Gastro-esophageal reflux disease without esophagitis: Secondary | ICD-10-CM | POA: Diagnosis not present

## 2018-06-10 DIAGNOSIS — M2341 Loose body in knee, right knee: Secondary | ICD-10-CM | POA: Diagnosis not present

## 2018-06-10 DIAGNOSIS — N186 End stage renal disease: Secondary | ICD-10-CM

## 2018-06-10 DIAGNOSIS — D631 Anemia in chronic kidney disease: Secondary | ICD-10-CM | POA: Insufficient documentation

## 2018-06-10 DIAGNOSIS — I132 Hypertensive heart and chronic kidney disease with heart failure and with stage 5 chronic kidney disease, or end stage renal disease: Secondary | ICD-10-CM | POA: Insufficient documentation

## 2018-06-10 DIAGNOSIS — M25461 Effusion, right knee: Secondary | ICD-10-CM | POA: Insufficient documentation

## 2018-06-10 DIAGNOSIS — Z794 Long term (current) use of insulin: Secondary | ICD-10-CM | POA: Diagnosis not present

## 2018-06-10 DIAGNOSIS — M23203 Derangement of unspecified medial meniscus due to old tear or injury, right knee: Secondary | ICD-10-CM | POA: Diagnosis not present

## 2018-06-10 DIAGNOSIS — M23231 Derangement of other medial meniscus due to old tear or injury, right knee: Secondary | ICD-10-CM | POA: Diagnosis not present

## 2018-06-10 DIAGNOSIS — M23200 Derangement of unspecified lateral meniscus due to old tear or injury, right knee: Secondary | ICD-10-CM | POA: Diagnosis not present

## 2018-06-10 DIAGNOSIS — Z800 Family history of malignant neoplasm of digestive organs: Secondary | ICD-10-CM | POA: Insufficient documentation

## 2018-06-10 DIAGNOSIS — T82510A Breakdown (mechanical) of surgically created arteriovenous fistula, initial encounter: Secondary | ICD-10-CM

## 2018-06-10 DIAGNOSIS — Z89421 Acquired absence of other right toe(s): Secondary | ICD-10-CM | POA: Insufficient documentation

## 2018-06-10 DIAGNOSIS — Z888 Allergy status to other drugs, medicaments and biological substances status: Secondary | ICD-10-CM | POA: Insufficient documentation

## 2018-06-10 DIAGNOSIS — M009 Pyogenic arthritis, unspecified: Secondary | ICD-10-CM | POA: Diagnosis not present

## 2018-06-10 DIAGNOSIS — Z90710 Acquired absence of both cervix and uterus: Secondary | ICD-10-CM | POA: Insufficient documentation

## 2018-06-10 DIAGNOSIS — E785 Hyperlipidemia, unspecified: Secondary | ICD-10-CM | POA: Diagnosis not present

## 2018-06-10 DIAGNOSIS — Z89422 Acquired absence of other left toe(s): Secondary | ICD-10-CM | POA: Insufficient documentation

## 2018-06-10 DIAGNOSIS — M23261 Derangement of other lateral meniscus due to old tear or injury, right knee: Secondary | ICD-10-CM

## 2018-06-10 DIAGNOSIS — Z833 Family history of diabetes mellitus: Secondary | ICD-10-CM | POA: Insufficient documentation

## 2018-06-10 DIAGNOSIS — M1990 Unspecified osteoarthritis, unspecified site: Secondary | ICD-10-CM | POA: Diagnosis not present

## 2018-06-10 DIAGNOSIS — F419 Anxiety disorder, unspecified: Secondary | ICD-10-CM | POA: Diagnosis not present

## 2018-06-10 DIAGNOSIS — I509 Heart failure, unspecified: Secondary | ICD-10-CM | POA: Diagnosis not present

## 2018-06-10 DIAGNOSIS — E1151 Type 2 diabetes mellitus with diabetic peripheral angiopathy without gangrene: Secondary | ICD-10-CM | POA: Diagnosis not present

## 2018-06-10 DIAGNOSIS — K449 Diaphragmatic hernia without obstruction or gangrene: Secondary | ICD-10-CM | POA: Diagnosis not present

## 2018-06-10 DIAGNOSIS — Z79899 Other long term (current) drug therapy: Secondary | ICD-10-CM | POA: Diagnosis not present

## 2018-06-10 DIAGNOSIS — G4730 Sleep apnea, unspecified: Secondary | ICD-10-CM | POA: Insufficient documentation

## 2018-06-10 DIAGNOSIS — Z87891 Personal history of nicotine dependence: Secondary | ICD-10-CM | POA: Insufficient documentation

## 2018-06-10 DIAGNOSIS — M810 Age-related osteoporosis without current pathological fracture: Secondary | ICD-10-CM | POA: Diagnosis not present

## 2018-06-10 DIAGNOSIS — E1122 Type 2 diabetes mellitus with diabetic chronic kidney disease: Secondary | ICD-10-CM | POA: Insufficient documentation

## 2018-06-10 DIAGNOSIS — E1140 Type 2 diabetes mellitus with diabetic neuropathy, unspecified: Secondary | ICD-10-CM | POA: Insufficient documentation

## 2018-06-10 HISTORY — DX: Unspecified injury of head, initial encounter: S09.90XA

## 2018-06-10 HISTORY — DX: Nausea with vomiting, unspecified: R11.2

## 2018-06-10 HISTORY — DX: End stage renal disease: N18.6

## 2018-06-10 HISTORY — DX: Nausea with vomiting, unspecified: Z98.890

## 2018-06-10 HISTORY — PX: KNEE ARTHROSCOPY: SHX127

## 2018-06-10 LAB — POCT I-STAT 4, (NA,K, GLUC, HGB,HCT)
Glucose, Bld: 66 mg/dL — ABNORMAL LOW (ref 70–99)
HCT: 35 % — ABNORMAL LOW (ref 36.0–46.0)
Hemoglobin: 11.9 g/dL — ABNORMAL LOW (ref 12.0–15.0)
Potassium: 3.9 mmol/L (ref 3.5–5.1)
Sodium: 136 mmol/L (ref 135–145)

## 2018-06-10 LAB — GLUCOSE, CAPILLARY
Glucose-Capillary: 71 mg/dL (ref 70–99)
Glucose-Capillary: 100 mg/dL — ABNORMAL HIGH (ref 70–99)
Glucose-Capillary: 49 mg/dL — ABNORMAL LOW (ref 70–99)
Glucose-Capillary: 74 mg/dL (ref 70–99)
Glucose-Capillary: 97 mg/dL (ref 70–99)
Glucose-Capillary: 83 mg/dL (ref 70–99)

## 2018-06-10 SURGERY — ARTHROSCOPY, KNEE
Anesthesia: General | Laterality: Right

## 2018-06-10 MED ORDER — CARVEDILOL 12.5 MG PO TABS
12.5000 mg | ORAL_TABLET | Freq: Two times a day (BID) | ORAL | Status: DC
  Administered 2018-06-10: 12.5 mg via ORAL
  Filled 2018-06-10: qty 1

## 2018-06-10 MED ORDER — CLOPIDOGREL BISULFATE 75 MG PO TABS
75.0000 mg | ORAL_TABLET | Freq: Every day | ORAL | Status: DC
  Administered 2018-06-10: 75 mg via ORAL
  Filled 2018-06-10: qty 1

## 2018-06-10 MED ORDER — DEXAMETHASONE SODIUM PHOSPHATE 10 MG/ML IJ SOLN
INTRAMUSCULAR | Status: AC
  Filled 2018-06-10: qty 1

## 2018-06-10 MED ORDER — ISOSORB DINITRATE-HYDRALAZINE 20-37.5 MG PO TABS
1.0000 | ORAL_TABLET | Freq: Three times a day (TID) | ORAL | Status: DC
  Administered 2018-06-10 (×3): 1 via ORAL
  Filled 2018-06-10 (×5): qty 1

## 2018-06-10 MED ORDER — DEXTROSE 50 % IV SOLN
INTRAVENOUS | Status: AC
  Filled 2018-06-10: qty 50

## 2018-06-10 MED ORDER — PROPOFOL 10 MG/ML IV BOLUS
INTRAVENOUS | Status: DC | PRN
  Administered 2018-06-10: 100 mg via INTRAVENOUS

## 2018-06-10 MED ORDER — FENTANYL CITRATE (PF) 100 MCG/2ML IJ SOLN
INTRAMUSCULAR | Status: AC
  Filled 2018-06-10: qty 2

## 2018-06-10 MED ORDER — ASPIRIN EC 81 MG PO TBEC
81.0000 mg | DELAYED_RELEASE_TABLET | Freq: Every day | ORAL | Status: DC
  Administered 2018-06-10: 81 mg via ORAL
  Filled 2018-06-10: qty 1

## 2018-06-10 MED ORDER — BISACODYL 10 MG RE SUPP: 10.0000 mg | Freq: Every day | RECTAL | Status: DC | PRN

## 2018-06-10 MED ORDER — SODIUM CHLORIDE 0.9 % IR SOLN
Status: DC | PRN
  Administered 2018-06-10 (×6): 3000 mL

## 2018-06-10 MED ORDER — CEFAZOLIN SODIUM-DEXTROSE 1-4 GM/50ML-% IV SOLN
INTRAVENOUS | Status: DC | PRN
  Administered 2018-06-10: 1 g via INTRAVENOUS

## 2018-06-10 MED ORDER — PROPOFOL 500 MG/50ML IV EMUL
INTRAVENOUS | Status: DC | PRN
  Administered 2018-06-10: 20 ug/kg/min via INTRAVENOUS

## 2018-06-10 MED ORDER — OXYCODONE HCL 5 MG/5ML PO SOLN: 5.0000 mg | Freq: Once | ORAL | Status: DC | PRN

## 2018-06-10 MED ORDER — ONDANSETRON HCL 4 MG/2ML IJ SOLN: 4.0000 mg | Freq: Four times a day (QID) | INTRAMUSCULAR | Status: DC | PRN

## 2018-06-10 MED ORDER — DEXTROSE 50 % IV SOLN
25.0000 mL | Freq: Once | INTRAVENOUS | Status: AC
  Administered 2018-06-10: 25 mL via INTRAVENOUS

## 2018-06-10 MED ORDER — OXYCODONE HCL 5 MG PO TABS: 10.0000 mg | ORAL_TABLET | ORAL | Status: DC | PRN

## 2018-06-10 MED ORDER — METOCLOPRAMIDE HCL 5 MG PO TABS: 5.0000 mg | ORAL_TABLET | Freq: Three times a day (TID) | ORAL | Status: DC | PRN

## 2018-06-10 MED ORDER — FENTANYL CITRATE (PF) 250 MCG/5ML IJ SOLN
INTRAMUSCULAR | Status: DC | PRN
  Administered 2018-06-10: 50 ug via INTRAVENOUS
  Administered 2018-06-10: 25 ug via INTRAVENOUS

## 2018-06-10 MED ORDER — ONDANSETRON HCL 4 MG/2ML IJ SOLN
INTRAMUSCULAR | Status: DC | PRN
  Administered 2018-06-10: 4 mg via INTRAVENOUS

## 2018-06-10 MED ORDER — PHENYLEPHRINE 40 MCG/ML (10ML) SYRINGE FOR IV PUSH (FOR BLOOD PRESSURE SUPPORT)
PREFILLED_SYRINGE | INTRAVENOUS | Status: AC
  Filled 2018-06-10: qty 10

## 2018-06-10 MED ORDER — SODIUM CHLORIDE 0.9 % IV SOLN
INTRAVENOUS | Status: DC
  Administered 2018-06-10: 12:00:00 via INTRAVENOUS

## 2018-06-10 MED ORDER — FENTANYL CITRATE (PF) 250 MCG/5ML IJ SOLN
INTRAMUSCULAR | Status: AC
  Filled 2018-06-10: qty 5

## 2018-06-10 MED ORDER — ALBUTEROL SULFATE HFA 108 (90 BASE) MCG/ACT IN AERS
1.0000 | INHALATION_SPRAY | Freq: Four times a day (QID) | RESPIRATORY_TRACT | Status: DC | PRN
  Filled 2018-06-10: qty 6.7

## 2018-06-10 MED ORDER — METHOCARBAMOL 1000 MG/10ML IJ SOLN
500.0000 mg | Freq: Four times a day (QID) | INTRAVENOUS | Status: DC | PRN
  Filled 2018-06-10: qty 5

## 2018-06-10 MED ORDER — ROCURONIUM BROMIDE 50 MG/5ML IV SOSY
PREFILLED_SYRINGE | INTRAVENOUS | Status: AC
  Filled 2018-06-10: qty 5

## 2018-06-10 MED ORDER — METOCLOPRAMIDE HCL 5 MG/ML IJ SOLN: 5.0000 mg | Freq: Three times a day (TID) | INTRAMUSCULAR | Status: DC | PRN

## 2018-06-10 MED ORDER — ONDANSETRON HCL 4 MG/2ML IJ SOLN
INTRAMUSCULAR | Status: AC
  Filled 2018-06-10: qty 2

## 2018-06-10 MED ORDER — METHOCARBAMOL 500 MG PO TABS
500.0000 mg | ORAL_TABLET | Freq: Four times a day (QID) | ORAL | Status: DC | PRN
  Administered 2018-06-10: 500 mg via ORAL
  Filled 2018-06-10: qty 1

## 2018-06-10 MED ORDER — SUCCINYLCHOLINE CHLORIDE 200 MG/10ML IV SOSY
PREFILLED_SYRINGE | INTRAVENOUS | Status: AC
  Filled 2018-06-10: qty 10

## 2018-06-10 MED ORDER — INSULIN ASPART 100 UNIT/ML ~~LOC~~ SOLN: 0.0000 [IU] | Freq: Three times a day (TID) | SUBCUTANEOUS | Status: DC

## 2018-06-10 MED ORDER — POLYETHYLENE GLYCOL 3350 17 G PO PACK: 17.0000 g | PACK | Freq: Every day | ORAL | Status: DC | PRN

## 2018-06-10 MED ORDER — LACTATED RINGERS IV SOLN: INTRAVENOUS | Status: DC | PRN

## 2018-06-10 MED ORDER — DOCUSATE SODIUM 100 MG PO CAPS
100.0000 mg | ORAL_CAPSULE | Freq: Two times a day (BID) | ORAL | Status: DC
  Administered 2018-06-10 (×2): 100 mg via ORAL
  Filled 2018-06-10 (×2): qty 1

## 2018-06-10 MED ORDER — PHENYLEPHRINE 40 MCG/ML (10ML) SYRINGE FOR IV PUSH (FOR BLOOD PRESSURE SUPPORT)
PREFILLED_SYRINGE | INTRAVENOUS | Status: DC | PRN
  Administered 2018-06-10 (×2): 80 ug via INTRAVENOUS

## 2018-06-10 MED ORDER — BUPIVACAINE HCL (PF) 0.25 % IJ SOLN
INTRAMUSCULAR | Status: AC
  Filled 2018-06-10: qty 30

## 2018-06-10 MED ORDER — LIDOCAINE 2% (20 MG/ML) 5 ML SYRINGE
INTRAMUSCULAR | Status: AC
  Filled 2018-06-10: qty 5

## 2018-06-10 MED ORDER — ONDANSETRON HCL 4 MG/2ML IJ SOLN: 4.0000 mg | Freq: Once | INTRAMUSCULAR | Status: DC | PRN

## 2018-06-10 MED ORDER — INSULIN ASPART 100 UNIT/ML ~~LOC~~ SOLN: 3.0000 [IU] | Freq: Three times a day (TID) | SUBCUTANEOUS | Status: DC

## 2018-06-10 MED ORDER — SODIUM CHLORIDE 0.9 % IV SOLN
INTRAVENOUS | Status: DC | PRN
  Administered 2018-06-10: 20 ug/min via INTRAVENOUS

## 2018-06-10 MED ORDER — ACETAMINOPHEN 325 MG PO TABS: 325.0000 mg | ORAL_TABLET | Freq: Four times a day (QID) | ORAL | Status: DC | PRN

## 2018-06-10 MED ORDER — SODIUM CHLORIDE 0.9 % IV SOLN
INTRAVENOUS | Status: DC | PRN
  Administered 2018-06-10: 07:00:00 via INTRAVENOUS

## 2018-06-10 MED ORDER — GABAPENTIN 100 MG PO CAPS
100.0000 mg | ORAL_CAPSULE | Freq: Every day | ORAL | Status: DC
  Administered 2018-06-10: 100 mg via ORAL
  Filled 2018-06-10: qty 1

## 2018-06-10 MED ORDER — OXYCODONE HCL 5 MG PO TABS
5.0000 mg | ORAL_TABLET | ORAL | Status: DC | PRN
  Administered 2018-06-10: 5 mg via ORAL
  Filled 2018-06-10: qty 1

## 2018-06-10 MED ORDER — PROPOFOL 10 MG/ML IV BOLUS
INTRAVENOUS | Status: AC
  Filled 2018-06-10: qty 40

## 2018-06-10 MED ORDER — OXYCODONE HCL 5 MG PO TABS: 5.0000 mg | ORAL_TABLET | Freq: Once | ORAL | Status: DC | PRN

## 2018-06-10 MED ORDER — PROMETHAZINE HCL 25 MG/ML IJ SOLN: 6.2500 mg | INTRAMUSCULAR | Status: DC | PRN

## 2018-06-10 MED ORDER — MOMETASONE FURO-FORMOTEROL FUM 200-5 MCG/ACT IN AERO
2.0000 | INHALATION_SPRAY | Freq: Two times a day (BID) | RESPIRATORY_TRACT | Status: DC
  Administered 2018-06-10 – 2018-06-11 (×2): 2 via RESPIRATORY_TRACT
  Filled 2018-06-10: qty 8.8

## 2018-06-10 MED ORDER — HYDROMORPHONE HCL 1 MG/ML IJ SOLN
0.5000 mg | INTRAMUSCULAR | Status: DC | PRN
  Administered 2018-06-10: 0.5 mg via INTRAVENOUS
  Filled 2018-06-10: qty 1

## 2018-06-10 MED ORDER — MIDAZOLAM HCL 2 MG/2ML IJ SOLN
INTRAMUSCULAR | Status: AC
  Filled 2018-06-10: qty 2

## 2018-06-10 MED ORDER — ATORVASTATIN CALCIUM 20 MG PO TABS
20.0000 mg | ORAL_TABLET | Freq: Every day | ORAL | Status: DC
  Administered 2018-06-10: 20 mg via ORAL
  Filled 2018-06-10: qty 1

## 2018-06-10 MED ORDER — EPHEDRINE 5 MG/ML INJ
INTRAVENOUS | Status: AC
  Filled 2018-06-10: qty 10

## 2018-06-10 MED ORDER — CINACALCET HCL 30 MG PO TABS
60.0000 mg | ORAL_TABLET | Freq: Every day | ORAL | Status: DC
  Administered 2018-06-10: 60 mg via ORAL
  Filled 2018-06-10: qty 2

## 2018-06-10 MED ORDER — FENTANYL CITRATE (PF) 100 MCG/2ML IJ SOLN
25.0000 ug | INTRAMUSCULAR | Status: DC | PRN
  Administered 2018-06-10 (×2): 50 ug via INTRAVENOUS

## 2018-06-10 MED ORDER — CHLORHEXIDINE GLUCONATE 4 % EX LIQD: 60.0000 mL | Freq: Once | CUTANEOUS | Status: DC

## 2018-06-10 MED ORDER — ONDANSETRON HCL 4 MG PO TABS: 4.0000 mg | ORAL_TABLET | Freq: Four times a day (QID) | ORAL | Status: DC | PRN

## 2018-06-10 MED ORDER — MAGNESIUM CITRATE PO SOLN: 1.0000 | Freq: Once | ORAL | Status: DC | PRN

## 2018-06-10 MED ORDER — FLUTICASONE PROPIONATE 50 MCG/ACT NA SUSP
1.0000 | Freq: Every day | NASAL | Status: DC | PRN
  Filled 2018-06-10: qty 16

## 2018-06-10 MED ORDER — DIAZEPAM 5 MG PO TABS: 5.0000 mg | ORAL_TABLET | Freq: Two times a day (BID) | ORAL | Status: DC | PRN

## 2018-06-10 MED ORDER — LIDOCAINE HCL (CARDIAC) PF 100 MG/5ML IV SOSY
PREFILLED_SYRINGE | INTRAVENOUS | Status: DC | PRN
  Administered 2018-06-10: 60 mg via INTRAVENOUS

## 2018-06-10 MED ORDER — ALBUTEROL SULFATE (2.5 MG/3ML) 0.083% IN NEBU: 2.5000 mg | INHALATION_SOLUTION | Freq: Four times a day (QID) | RESPIRATORY_TRACT | Status: DC | PRN

## 2018-06-10 SURGICAL SUPPLY — 39 items
BLADE CUDA 5.5 (BLADE) IMPLANT
BLADE GREAT WHITE 4.2 (BLADE) ×2 IMPLANT
BNDG COHESIVE 6X5 TAN STRL LF (GAUZE/BANDAGES/DRESSINGS) ×2 IMPLANT
BUR OVAL 6.0 (BURR) IMPLANT
COVER SURGICAL LIGHT HANDLE (MISCELLANEOUS) ×4 IMPLANT
COVER WAND RF STERILE (DRAPES) ×2 IMPLANT
CUFF TOURNIQUET SINGLE 44IN (TOURNIQUET CUFF) IMPLANT
DRAPE ARTHROSCOPY W/POUCH 114 (DRAPES) ×2 IMPLANT
DRAPE U-SHAPE 47X51 STRL (DRAPES) ×2 IMPLANT
DRESSING ADAPTIC 1/2  N-ADH (PACKING) ×2 IMPLANT
DRSG EMULSION OIL 3X3 NADH (GAUZE/BANDAGES/DRESSINGS) ×2 IMPLANT
DRSG PAD ABDOMINAL 8X10 ST (GAUZE/BANDAGES/DRESSINGS) ×2 IMPLANT
DURAPREP 26ML APPLICATOR (WOUND CARE) ×2 IMPLANT
GAUZE SPONGE 4X4 12PLY STRL (GAUZE/BANDAGES/DRESSINGS) ×2 IMPLANT
GAUZE SPONGE 4X4 12PLY STRL LF (GAUZE/BANDAGES/DRESSINGS) ×2 IMPLANT
GLOVE BIO SURGEON STRL SZ 6.5 (GLOVE) ×4 IMPLANT
GLOVE BIOGEL PI IND STRL 9 (GLOVE) ×1 IMPLANT
GLOVE BIOGEL PI INDICATOR 9 (GLOVE) ×1
GLOVE SURG ORTHO 9.0 STRL STRW (GLOVE) ×2 IMPLANT
GLOVE SURG SS PI 8.0 STRL IVOR (GLOVE) ×4 IMPLANT
GOWN STRL REUS W/ TWL XL LVL3 (GOWN DISPOSABLE) ×3 IMPLANT
GOWN STRL REUS W/TWL XL LVL3 (GOWN DISPOSABLE) ×3
KIT BASIN OR (CUSTOM PROCEDURE TRAY) ×2 IMPLANT
KIT TURNOVER KIT B (KITS) ×2 IMPLANT
MANIFOLD NEPTUNE II (INSTRUMENTS) ×2 IMPLANT
NEEDLE 18GX1X1/2 (RX/OR ONLY) (NEEDLE) ×2 IMPLANT
PACK ARTHROSCOPY DSU (CUSTOM PROCEDURE TRAY) ×2 IMPLANT
PAD ABD 8X10 STRL (GAUZE/BANDAGES/DRESSINGS) ×2 IMPLANT
PAD ARMBOARD 7.5X6 YLW CONV (MISCELLANEOUS) ×4 IMPLANT
PADDING CAST COTTON 6X4 STRL (CAST SUPPLIES) ×2 IMPLANT
PROBE BIPOLAR ARTHRO 85MM 30D (MISCELLANEOUS) ×2 IMPLANT
SUT ETHILON 4 0 PS 2 18 (SUTURE) ×2 IMPLANT
SYR 20CC LL (SYRINGE) ×2 IMPLANT
TOWEL OR 17X24 6PK STRL BLUE (TOWEL DISPOSABLE) ×4 IMPLANT
TUBE CONNECTING 12X1/4 (SUCTIONS) ×2 IMPLANT
TUBING ARTHROSCOPY IRRIG 16FT (MISCELLANEOUS) ×4 IMPLANT
WAND HAND CNTRL MULTIVAC 90 (MISCELLANEOUS) IMPLANT
WAND STAR VAC 90 (SURGICAL WAND) ×2 IMPLANT
WATER STERILE IRR 1000ML POUR (IV SOLUTION) ×2 IMPLANT

## 2018-06-10 NOTE — Progress Notes (Signed)
Dr. Christella Hartigan aware of patients blood sugar.  Patient is asymptomatic.

## 2018-06-10 NOTE — Progress Notes (Addendum)
[facility] Brief Note  Jonetta Speak underwent R knee arthroscopy and debridement this am. Bloody knee aspirate sent for cultures per Dr. Sharol Given.  Observation tonight with plans for discharge tomorrow. She will need routine dialysis before discharge.   Dialysis Orders:  [facility] MWF 4h BFR 400/800 EDW 62kg 3K/2.25 CA UF Profile 4 LUE AVG Hep 2500 Mircera 144mcg  q 2 weeks (Last 11/6)  Plan Dialysis in the am 1st shift. For discharge after   Lynnda Child PA-C [place] Pager [phone] 06/10/2018,4:30 PM   Pt seen, examined and agree w A/P as above.  Kelly Splinter MD [employer] pager [phone]   06/10/2018, 5:15 PM

## 2018-06-10 NOTE — Transfer of Care (Signed)
Immediate Anesthesia Transfer of Care Note  Patient: Destiny Day  Procedure(s) Performed: RIGHT KNEE ARTHROSCOPY AND DEBRIDEMENT (Right )  Patient Location: PACU  Anesthesia Type:General  Level of Consciousness: drowsy  Airway & Oxygen Therapy: Patient Spontanous Breathing and Patient connected to face mask oxygen  Post-op Assessment: Report given to RN, Post -op Vital signs reviewed and stable and Patient moving all extremities X 4  Post vital signs: Reviewed and stable  Last Vitals:  Vitals Value Taken Time  BP 117/50 06/10/2018  8:31 AM  Temp 36.2 C 06/10/2018  8:31 AM  Pulse 65 06/10/2018  8:40 AM  Resp 11 06/10/2018  8:40 AM  SpO2 100 % 06/10/2018  8:40 AM  Vitals shown include unvalidated device data.  Last Pain:  Vitals:   06/10/18 0639  TempSrc:   PainSc: 2       Patients Stated Pain Goal: 2 (70/62/37 6283)  Complications: Pts CBG is 49. Dr. Kerin Perna notified. 1/2 amp of D50 given.

## 2018-06-10 NOTE — Anesthesia Procedure Notes (Signed)
Procedure Name: LMA Insertion Date/Time: 06/10/2018 7:43 AM Performed by: Julieta Bellini, CRNA Pre-anesthesia Checklist: Patient identified, Emergency Drugs available, Suction available and Patient being monitored Patient Re-evaluated:Patient Re-evaluated prior to induction Oxygen Delivery Method: Circle system utilized Preoxygenation: Pre-oxygenation with 100% oxygen Induction Type: IV induction Ventilation: Mask ventilation without difficulty LMA: LMA inserted LMA Size: 4.0 Number of attempts: 1 Placement Confirmation: positive ETCO2 and breath sounds checked- equal and bilateral Tube secured with: Tape Dental Injury: Teeth and Oropharynx as per pre-operative assessment

## 2018-06-10 NOTE — Op Note (Signed)
06/10/2018  8:22 AM  PATIENT:  Destiny Day    PRE-OPERATIVE DIAGNOSIS:  Effusion Right Knee  POST-OPERATIVE DIAGNOSIS:  Same  PROCEDURE:  RIGHT KNEE ARTHROSCOPY AND DEBRIDEMENT with partial medial and lateral meniscectomies and abrasion chondroplasty of a large osteochondral defect of the medial femoral condyle.  SURGEON:  Newt Minion, MD  PHYSICIAN ASSISTANT:None ANESTHESIA:   General  PREOPERATIVE INDICATIONS:  Destiny Day is a  67 y.o. female with a diagnosis of Effusion Right Knee who failed conservative measures and elected for surgical management.    The risks benefits and alternatives were discussed with the patient preoperatively including but not limited to the risks of infection, bleeding, nerve injury, cardiopulmonary complications, the need for revision surgery, among others, and the patient was willing to proceed.  OPERATIVE IMPLANTS: None  @ENCIMAGES @  OPERATIVE FINDINGS: Synovitis with large osteochondral defect medial femoral condyle and degenerative changes of the medial and lateral meniscus  OPERATIVE PROCEDURE: Patient was brought to the operating room and underwent a general anesthetic.  After adequate levels anesthesia obtained patient's right lower extremity was prepped using DuraPrep draped into a sterile field a timeout was called.  The scope was inserted through the inferior lateral portal and an inferior medial portal was made for a working portal.  Patient had extensive synovitis tricompartmental excision of the synovitis was performed the electrocautery was used for hemostasis.  Examination the medial and lateral meniscus showed further degenerative changes and this was debrided.  Patient had a large flap tear with osteochondral defect of the medial femoral condyle.  The biter and shaver were used to remove the articular cartilage fragments.  This left a defect approximately 2 cm in diameter.  A survey of all compartments showed there to be no  further loose bodies.  Electrocautery was used for further hemostasis along the synovial lining.  Instruments removed the portals were closed using 3-0 nylon and a sterile compressive dressing was applied patient was extubated taken the PACU in stable condition.   DISCHARGE PLANNING:  Antibiotic duration: Preoperative antibiotics 1 g Kefzol patient on dialysis no further antibiotics at this time.  Weightbearing: Weightbearing as tolerated  Pain medication: Opioid pathway ordered  Dressing care/ Wound VAC: Dry dressing may change in 2 days  Ambulatory devices: Weightbearing as tolerated with walker  Discharge to: Plan for dialysis with discharge to home on Saturday.  Follow-up: In the office 1 week post operative.

## 2018-06-10 NOTE — Evaluation (Signed)
Physical Therapy Evaluation Patient Details Name: Destiny Day MRN: [id number] DOB: 12/06/1950 Today's Date: 06/10/2018   History of Present Illness   Destiny Day is a  67 y.o. female s/p Rt knee arthroscopy and debridement 11/15. Pt with recent meniscectomy of right knee in October. PMhx bil transmetatarsal amputation,  CHF, anxiety, PVD, Asthma, sleep apnea, neuropathy, DM, HTN, and ESRD dialysis MWF.  Clinical Impression  Pt in bed on arrival initially drowsy with increased time to answer questions with increased alertness and responsiveness EOB. Pt with assist to don bil shoes and AFO and reports spouse assists with this at home. Pt returned home from SNF grossly 5 days ago and states she plans to return home since she is walking now. Pt with no significant ROM of Rt knee and unable to tolerate PROM attempts. Pt with slow transitions and increased time but able to perform basic transfers and limited gait with minguard. Pt with decreased ROM, strength, gait and function who will benefit from acute therapy to maximize mobility and independence.     Follow Up Recommendations Home health PT;Supervision for mobility/OOB    Equipment Recommendations  None recommended by PT    Recommendations for Other Services       Precautions / Restrictions Precautions Precautions: Fall Precaution Comments: Rt AFO, bil orthotic shoes Restrictions Weight Bearing Restrictions: Yes RLE Weight Bearing: Weight bearing as tolerated      Mobility  Bed Mobility Overal bed mobility: Needs Assistance Bed Mobility: Supine to Sit;Sit to Supine     Supine to sit: Min guard Sit to supine: Min guard   General bed mobility comments: pt needing initial assist to begin movement of RLE toward EOB with pt then able to takeover and complete pivot to EOB. Return to bed pt hooking LLE under RLE to lift leg onto surface. pt states she uses leg lifter as needed at home  Transfers Overall transfer level:  Needs assistance   Transfers: Sit to/from Stand Sit to Stand: Min guard;From elevated surface         General transfer comment: cues for hand placement with bed elevated from home height to allow pt to stand, increased time to transition with reliance on bil UE and LLE  Ambulation/Gait Ambulation/Gait assistance: Min guard Gait Distance (Feet): 22 Feet Assistive device: Rolling walker (2 wheeled) Gait Pattern/deviations: Step-to pattern;Trunk flexed;Decreased stance time - right   Gait velocity interpretation: <1.8 ft/sec, indicate of risk for recurrent falls General Gait Details: Pt with step to pattern with reliance on bil UE on RW to off weight RLE with stepping. pt reports nearly baseline gait pattern from home. Distance limited by fatigue and nausea  Stairs            Wheelchair Mobility    Modified Rankin (Stroke Patients Only)       Balance Overall balance assessment: Needs assistance   Sitting balance-Leahy Scale: Good     Standing balance support: Bilateral upper extremity supported Standing balance-Leahy Scale: Poor                               Pertinent Vitals/Pain Pain Assessment: No/denies pain    Home Living Family/patient expects to be discharged to:: Private residence Living Arrangements: Spouse/significant other Available Help at Discharge: Family;Available 24 hours/day Type of Home: House Home Access: Level entry     Home Layout: Two level;Able to live on main level with bedroom/bathroom Home Equipment: Gilford Rile -  4 wheels;Cane - single point;Shower seat;Crutches;Walker - 2 wheels;Bedside commode;Wheelchair - manual;Tub bench      Prior Function Level of Independence: Independent with assistive device(s);Needs assistance   Gait / Transfers Assistance Needed: using RW for gait. limited ambulation distance with assist at times for basic transfers  ADL's / Homemaking Assistance Needed: spouse does the homemaking         Hand Dominance   Dominant Hand: Right    Extremity/Trunk Assessment   Upper Extremity Assessment Upper Extremity Assessment: Overall WFL for tasks assessed    Lower Extremity Assessment Lower Extremity Assessment: RLE deficits/detail RLE Deficits / Details: no significant ROM of right knee maintaintaining grossly 10 degrees of flexion at all times, able to bear weight but unable to fully assess strength with limited motion    Cervical / Trunk Assessment Cervical / Trunk Assessment: Normal  Communication   Communication: No difficulties  Cognition Arousal/Alertness: Suspect due to medications Behavior During Therapy: WFL for tasks assessed/performed Overall Cognitive Status: Within Functional Limits for tasks assessed                                 General Comments: pt slightly delayed processing and reports she is a little off and hasn't cleared anesthesia yet      General Comments      Exercises     Assessment/Plan    PT Assessment Patient needs continued PT services  PT Problem List Decreased strength;Decreased range of motion;Decreased mobility;Decreased activity tolerance;Decreased balance       PT Treatment Interventions DME instruction;Functional mobility training;Gait training;Therapeutic activities;Therapeutic exercise;Balance training;Patient/family education    PT Goals (Current goals can be found in the Care Plan section)  Acute Rehab PT Goals Patient Stated Goal: return home and care for myself PT Goal Formulation: With patient Time For Goal Achievement: 06/24/18 Potential to Achieve Goals: Fair    Frequency Min 3X/week   Barriers to discharge        Co-evaluation               AM-PAC PT "6 Clicks" Daily Activity  Outcome Measure Difficulty turning over in bed (including adjusting bedclothes, sheets and blankets)?: A Lot Difficulty moving from lying on back to sitting on the side of the bed? : A Lot Difficulty  sitting down on and standing up from a chair with arms (e.g., wheelchair, bedside commode, etc,.)?: A Lot Help needed moving to and from a bed to chair (including a wheelchair)?: A Little Help needed walking in hospital room?: A Little Help needed climbing 3-5 steps with a railing? : A Lot 6 Click Score: 14    End of Session Equipment Utilized During Treatment: Gait belt;Other (comment)(RT AFO, orthotic shoes) Activity Tolerance: Patient tolerated treatment well Patient left: in bed;with call bell/phone within reach;with family/visitor present Nurse Communication: Mobility status PT Visit Diagnosis: Other abnormalities of gait and mobility (R26.89);Muscle weakness (generalized) (M62.81)    Time: 2094-7096 PT Time Calculation (min) (ACUTE ONLY): 35 min   Charges:   PT Evaluation $PT Eval Moderate Complexity: 1 Mod PT Treatments $Therapeutic Activity: 8-22 mins        Lydiana Milley Pam Drown, PT Acute Rehabilitation Services Pager: [phone] Office: [phone]   Miachel Nardelli B Alaila Pillard 06/10/2018, 1:42 PM

## 2018-06-10 NOTE — H&P (Signed)
Destiny Day is an 67 y.o. female.   Chief Complaint: Right knee pain with persistent effusion and pain. HPI: Patient is a 67 year old woman end-stage renal disease on dialysis is currently receiving Kefzol with her dialysis.  Patient states that her knee pain has not resolved since surgery she has had no acute changes she has pain with range of motion complains of swelling denies any fever chills or systemic symptoms.  Patient is currently been discharged from skilled nursing.  Past Medical History:  Diagnosis Date  . Abdominal bruit   . Anemia   . Anxiety   . Arthritis    Osteoarthritis  . Asthma   . Cervical disc disease    "pinced nerve"  . CHF (congestive heart failure) ([place])   . Complication of anesthesia    " after I got home from my last procedure, I started itching."  . Diabetes mellitus    Type II  . Diverticulitis   . ESRD (end stage renal disease) ([place])    dialysis - M/W/F- [place]  . GERD (gastroesophageal reflux disease)    from medications  . GI bleed 03/31/2013  . Head injury 07/2017  . History of hiatal hernia   . Hyperlipidemia   . Hypertension   . Neuropathy    left leg  . Osteoporosis   . Peripheral vascular disease ([place])   . Pneumonia    "very young" and a few years ago  . PONV (postoperative nausea and vomiting)   . Seasonal allergies   . Shortness of breath dyspnea    WIth exertion, when fluid builds  . Sleep apnea    can't afford cpap    Past Surgical History:  Procedure Laterality Date  . A/V SHUNTOGRAM N/A 09/22/2016   Procedure: A/V Shuntogram - left arm;  Surgeon: Serafina Mitchell, MD;  Location: [place] [facility];  Service: Cardiovascular;  Laterality: N/A;  . A/V SHUNTOGRAM N/A 03/22/2018   Procedure: A/V SHUNTOGRAM - left arm;  Surgeon: Serafina Mitchell, MD;  Location: [place] [facility];  Service: Cardiovascular;  Laterality: N/A;  . ABDOMINAL HYSTERECTOMY  1993`  . AMPUTATION Left 09/01/2016   Procedure: LEFT FOOT TRANSMETATARSAL  AMPUTATION;  Surgeon: Newt Minion, MD;  Location: [place];  Service: Orthopedics;  Laterality: Left;  . AMPUTATION Right 08/11/2017   Procedure: RIGHT GREAT TOE AMPUTATION DIGIT;  Surgeon: Rosetta Posner, MD;  Location: [place];  Service: Vascular;  Laterality: Right;  . AMPUTATION Right 12/31/2017   Procedure: RIGHT TRANSMETATARSAL AMPUTATION;  Surgeon: Newt Minion, MD;  Location: [place];  Service: Orthopedics;  Laterality: Right;  . AV FISTULA PLACEMENT Left 04/21/2016   Procedure: INSERTION OF ARTERIOVENOUS (AV) GORE-TEX GRAFT ARM LEFT;  Surgeon: Elam Dutch, MD;  Location: [place];  Service: Vascular;  Laterality: Left;  . [place] TRANSPOSITION Left 07/10/2014   Procedure: BASCILIC VEIN TRANSPOSITION;  Surgeon: Angelia Mould, MD;  Location: [place];  Service: Vascular;  Laterality: Left;  . BASCILIC VEIN TRANSPOSITION Right 11/08/2014   Procedure: FIRST STAGE BASILIC VEIN TRANSPOSITION;  Surgeon: Angelia Mould, MD;  Location: [place];  Service: Vascular;  Laterality: Right;  . BASCILIC VEIN TRANSPOSITION Right 01/18/2015   Procedure: SECOND STAGE BASILIC VEIN TRANSPOSITION;  Surgeon: Angelia Mould, MD;  Location: [place];  Service: Vascular;  Laterality: Right;  . CRANIOTOMY N/A 08/23/2017   Procedure: CRANIOTOMY HEMATOMA EVACUATION SUBDURAL;  Surgeon: Ashok Pall, MD;  Location: [place];  Service: Neurosurgery;  Laterality:  N/A;  . CRANIOTOMY Left 08/24/2017   Procedure: CRANIOTOMY FOR RECURRENT ACUTE SUBDURAL HEMATOMA;  Surgeon: Ashok Pall, MD;  Location: [place];  Service: Neurosurgery;  Laterality: Left;  . ESOPHAGOGASTRODUODENOSCOPY N/A 03/31/2013   Procedure: ESOPHAGOGASTRODUODENOSCOPY (EGD);  Surgeon: Gatha Mayer, MD;  Location: [facility] END[facility];  Service: Endoscopy;  Laterality: N/A;  . EYE SURGERY     laser surgery  . FEMORAL-POPLITEAL BYPASS GRAFT Left 07/08/2016   Procedure: LEFT  FEMORAL-BELOW KNEE POPLITEAL ARTERY BYPASS GRAFT USING 6MM X 80 CM PROPATEN GORETEX  GRAFT WITH RINGS.;  Surgeon: Rosetta Posner, MD;  Location: [place];  Service: Vascular;  Laterality: Left;  . FEMORAL-POPLITEAL BYPASS GRAFT Right 08/11/2017   Procedure: RIGHT FEMORAL TO BELOW KNEE POPLITEAL ARTERKY  BYPASS GRAFT USING 6MM RINGED PROPATEN GRAFT;  Surgeon: Rosetta Posner, MD;  Location: [place];  Service: Vascular;  Laterality: Right;  . FISTULOGRAM Left 10/29/2014   Procedure: FISTULOGRAM;  Surgeon: Angelia Mould, MD;  Location: [facility] C[facility] LAB;  Service: Cardiovascular;  Laterality: Left;  . KNEE ARTHROSCOPY Right 05/06/2018   Procedure: RIGHT KNEE ARTHROSCOPY;  Surgeon: Newt Minion, MD;  Location: [place];  Service: Orthopedics;  Laterality: Right;  . LIGATION OF ARTERIOVENOUS  FISTULA Left 04/21/2016   Procedure: LIGATION OF ARTERIOVENOUS  FISTULA LEFT ARM;  Surgeon: Elam Dutch, MD;  Location: [place];  Service: Vascular;  Laterality: Left;  . LOWER EXTREMITY ANGIOGRAPHY N/A 04/06/2017   Procedure: Lower Extremity Angiography - Right;  Surgeon: Serafina Mitchell, MD;  Location: [place] [facility];  Service: Cardiovascular;  Laterality: N/A;  . PATCH ANGIOPLASTY Right 01/18/2015   Procedure: BASILIC VEIN PATCH ANGIOPLASTY USING VASCUGUARD PATCH;  Surgeon: Angelia Mould, MD;  Location: [place];  Service: Vascular;  Laterality: Right;  . PERIPHERAL VASCULAR BALLOON ANGIOPLASTY  09/22/2016   Procedure: Peripheral Vascular Balloon Angioplasty;  Surgeon: Serafina Mitchell, MD;  Location: [place] [facility];  Service: Cardiovascular;;  Lt. Fistula  . PERIPHERAL VASCULAR BALLOON ANGIOPLASTY Left 03/22/2018   Procedure: PERIPHERAL VASCULAR BALLOON ANGIOPLASTY;  Surgeon: Serafina Mitchell, MD;  Location: [place] [facility];  Service: Cardiovascular;  Laterality: Left;  Arm shunt  . PERIPHERAL VASCULAR CATHETERIZATION N/A 06/23/2016   Procedure: Abdominal Aortogram w/Lower Extremity;  Surgeon: Serafina Mitchell, MD;  Location: [place] [facility];  Service: Cardiovascular;  Laterality: N/A;  .  PERIPHERAL VASCULAR CATHETERIZATION  06/23/2016   Procedure: Peripheral Vascular Intervention;  Surgeon: Serafina Mitchell, MD;  Location: [place] [facility];  Service: Cardiovascular;;  lt common and external illiac artery    Family History  Problem Relation Age of Onset  . Other Mother        not sure of cause of death  . Diabetes Father   . Pancreatic cancer Maternal Grandmother   . Colon cancer Neg Hx    Social History:  reports that she quit smoking about 6 years ago. Her smoking use included cigarettes. She has a 14.00 pack-year smoking history. She has never used smokeless tobacco. She reports that she does not drink alcohol or use drugs.  Allergies:  Allergies  Allergen Reactions  . Prednisone Swelling and Other (See Comments)    Excessive fluid buildup  . Lisinopril Cough    Medications Prior to Admission  Medication Sig Dispense Refill  . albuterol (PROVENTIL HFA;VENTOLIN HFA) 108 (90 Base) MCG/ACT inhaler Inhale 1-2 puffs into the lungs every 6 (six) hours as needed for wheezing or shortness of breath. 1  Inhaler 0  . aspirin EC 81 MG tablet Take 81 mg by mouth daily.    Marland Kitchen atorvastatin (LIPITOR) 20 MG tablet Take 20 mg by mouth at bedtime.     . budesonide-formoterol (SYMBICORT) 160-4.5 MCG/ACT inhaler Inhale 1 puff into the lungs daily as needed (for respiratory issues).     . carvedilol (COREG) 12.5 MG tablet Take 12.5 mg by mouth 2 (two) times daily with a meal.    . cinacalcet (SENSIPAR) 60 MG tablet Take 60 mg by mouth daily. Pt. Gets at dialysis  3  . clopidogrel (PLAVIX) 75 MG tablet Take 75 mg by mouth daily.    . diazepam (VALIUM) 5 MG tablet Take 5 mg by mouth 2 (two) times daily as needed for anxiety.    . gabapentin (NEURONTIN) 100 MG capsule TAKE 1 CAPSULE (100 MG TOTAL) BY MOUTH AT BEDTIME. WHEN NECESSARY FOR NEUROPATHY PAIN 30 capsule 3  . hydrocerin (EUCERIN) CREA Apply 616 application topically 2 (two) times daily. (Patient taking differently: Apply 1  application topically 2 (two) times daily. ) 228 g 0  . insulin NPH Human (HUMULIN N,NOVOLIN N) 100 UNIT/ML injection Inject 0.1 mLs (10 Units total) into the skin at bedtime. 10 mL 0  . insulin regular (NOVOLIN R,HUMULIN R) 100 units/mL injection Inject 3-8 Units into the skin 3 (three) times daily before meals. Per sliding scale on non-dialysis days    . isosorbide-hydrALAZINE (BIDIL) 20-37.5 MG tablet Take 1 tablet by mouth 3 (three) times daily.     Marland Kitchen lidocaine-prilocaine (EMLA) cream Apply 1 application topically 3 (three) times a week. 1-2 hours prior to dialysis  12  . loratadine (CLARITIN) 10 MG tablet Take 10 mg by mouth daily as needed for allergies.    Marland Kitchen omeprazole (PRILOSEC) 20 MG capsule Take 20 mg by mouth daily.     Marland Kitchen oxyCODONE-acetaminophen (PERCOCET/ROXICET) 5-325 MG tablet Take 1 tablet by mouth every 4 (four) hours as needed for severe pain. 40 tablet 0  . polyethylene glycol (MIRALAX / GLYCOLAX) packet Take 17 g by mouth daily. (Patient taking differently: Take 17 g by mouth daily as needed for moderate constipation. ) 30 each 0  . senna (SENOKOT) 8.6 MG tablet Take 1 tablet by mouth as needed for constipation.    . fluticasone (FLONASE) 50 MCG/ACT nasal spray Place 1 spray into both nostrils daily as needed for allergies or rhinitis.    Marland Kitchen lidocaine, PF, (XYLOCAINE) 1 % SOLN injection Inject 5 mLs into the skin as needed (topical anesthesia for hemodialysis ifGEBAUERS is ineffective.). 2 mL 0  . multivitamin (RENA-VIT) TABS tablet Take 1 tablet by mouth daily.   5    Results for orders placed or performed during the hospital encounter of 06/10/18 (from the past 48 hour(s))  Glucose, capillary     Status: None   Collection Time: 06/10/18  6:23 AM  Result Value Ref Range   Glucose-Capillary 71 70 - 99 mg/dL   Comment 1 Notify RN    No results found.  Review of Systems  All other systems reviewed and are negative.   Blood pressure (!) 119/40, pulse 76, temperature 97.8  F (36.6 C), temperature source Oral, resp. rate 18, height 5\' 5"  (1.651 m), weight 62.2 kg, SpO2 100 %. Physical Exam  Patient is alert, oriented, no adenopathy, well-dressed, normal affect, normal respiratory effort. Examination the ulcer over the knee has healed there is no fluctuance no tenderness to palpation over the prepatellar ulcer.  She does have  an effusion of the right knee.  She has pain with range of motion.  After informed consent sterile prepping the knee was aspirated from the superior lateral portal could only obtain about 2 cc this was bloody this is sent for cultures with the pain and persistent effusion I am concerned for possible recurrent infection despite initial debridement IV antibiotics. Assessment/Plan 1. Status post arthroscopy of right knee   2. Pyogenic arthritis of right knee joint, due to unspecified organism [facility])     Plan: The knee was aspirated this was bloody with patient's persistent effusion and symptoms I am concerned for possible recurrent infection.  Aspirate was sent for cultures plan for surgery Friday morning anticipate overnight observation with dialysis at [facility] on Saturday with discharge to skilled nursing..  Continue with kefzol with dialysis for the methicillin sensitive staph.   Newt Minion, MD 06/10/2018, 6:50 AM

## 2018-06-11 DIAGNOSIS — M25461 Effusion, right knee: Secondary | ICD-10-CM | POA: Diagnosis not present

## 2018-06-11 DIAGNOSIS — M23200 Derangement of unspecified lateral meniscus due to old tear or injury, right knee: Secondary | ICD-10-CM | POA: Diagnosis not present

## 2018-06-11 DIAGNOSIS — I132 Hypertensive heart and chronic kidney disease with heart failure and with stage 5 chronic kidney disease, or end stage renal disease: Secondary | ICD-10-CM | POA: Diagnosis not present

## 2018-06-11 DIAGNOSIS — M23231 Derangement of other medial meniscus due to old tear or injury, right knee: Secondary | ICD-10-CM | POA: Diagnosis not present

## 2018-06-11 DIAGNOSIS — Z992 Dependence on renal dialysis: Secondary | ICD-10-CM | POA: Diagnosis not present

## 2018-06-11 DIAGNOSIS — N2581 Secondary hyperparathyroidism of renal origin: Secondary | ICD-10-CM | POA: Diagnosis not present

## 2018-06-11 DIAGNOSIS — D631 Anemia in chronic kidney disease: Secondary | ICD-10-CM | POA: Diagnosis not present

## 2018-06-11 DIAGNOSIS — I120 Hypertensive chronic kidney disease with stage 5 chronic kidney disease or end stage renal disease: Secondary | ICD-10-CM | POA: Diagnosis not present

## 2018-06-11 DIAGNOSIS — E1122 Type 2 diabetes mellitus with diabetic chronic kidney disease: Secondary | ICD-10-CM | POA: Diagnosis not present

## 2018-06-11 DIAGNOSIS — M009 Pyogenic arthritis, unspecified: Secondary | ICD-10-CM | POA: Diagnosis not present

## 2018-06-11 DIAGNOSIS — N186 End stage renal disease: Secondary | ICD-10-CM | POA: Diagnosis not present

## 2018-06-11 LAB — CBC
HCT: 28.3 % — ABNORMAL LOW (ref 36.0–46.0)
Hemoglobin: 8.7 g/dL — ABNORMAL LOW (ref 12.0–15.0)
MCH: 31.4 pg (ref 26.0–34.0)
MCHC: 30.7 g/dL (ref 30.0–36.0)
MCV: 102.2 fL — ABNORMAL HIGH (ref 80.0–100.0)
Platelets: 294 10*3/uL (ref 150–400)
RBC: 2.77 MIL/uL — ABNORMAL LOW (ref 3.87–5.11)
RDW: 15.1 % (ref 11.5–15.5)
WBC: 5.9 10*3/uL (ref 4.0–10.5)
nRBC: 0 % (ref 0.0–0.2)

## 2018-06-11 LAB — RENAL FUNCTION PANEL
Albumin: 2.2 g/dL — ABNORMAL LOW (ref 3.5–5.0)
Anion gap: 10 (ref 5–15)
BUN: 25 mg/dL — ABNORMAL HIGH (ref 8–23)
CO2: 26 mmol/L (ref 22–32)
Calcium: 7.8 mg/dL — ABNORMAL LOW (ref 8.9–10.3)
Chloride: 98 mmol/L (ref 98–111)
Creatinine, Ser: 6.03 mg/dL — ABNORMAL HIGH (ref 0.44–1.00)
GFR calc Af Amer: 8 mL/min — ABNORMAL LOW (ref >60)
GFR calc non Af Amer: 6 mL/min — ABNORMAL LOW (ref >60)
Glucose, Bld: 101 mg/dL — ABNORMAL HIGH (ref 70–99)
Phosphorus: 6 mg/dL — ABNORMAL HIGH (ref 2.5–4.6)
Potassium: 4.1 mmol/L (ref 3.5–5.1)
Sodium: 134 mmol/L — ABNORMAL LOW (ref 135–145)

## 2018-06-11 LAB — GLUCOSE, CAPILLARY
Glucose-Capillary: 70 mg/dL (ref 70–99)
Glucose-Capillary: 85 mg/dL (ref 70–99)

## 2018-06-11 MED ORDER — HEPARIN SODIUM (PORCINE) 1000 UNIT/ML DIALYSIS: 20.0000 [IU]/kg | INTRAMUSCULAR | Status: DC | PRN

## 2018-06-11 MED ORDER — PENTAFLUOROPROP-TETRAFLUOROETH EX AERO: 1.0000 "application " | INHALATION_SPRAY | CUTANEOUS | Status: DC | PRN

## 2018-06-11 MED ORDER — LIDOCAINE HCL (PF) 1 % IJ SOLN: 5.0000 mL | INTRAMUSCULAR | Status: DC | PRN

## 2018-06-11 MED ORDER — SODIUM CHLORIDE 0.9 % IV SOLN: 100.0000 mL | INTRAVENOUS | Status: DC | PRN

## 2018-06-11 MED ORDER — HEPARIN SODIUM (PORCINE) 1000 UNIT/ML DIALYSIS: 1000.0000 [IU] | INTRAMUSCULAR | Status: DC | PRN

## 2018-06-11 MED ORDER — LIDOCAINE-PRILOCAINE 2.5-2.5 % EX CREA: 1.0000 "application " | TOPICAL_CREAM | CUTANEOUS | Status: DC | PRN

## 2018-06-11 NOTE — Progress Notes (Signed)
Nsg Discharge Note  Admit Date:  06/10/2018 Discharge date: 06/11/2018   [name] to be D/C'd Home per MD order.  AVS completed.  Copy for chart, and copy for patient signed, and dated. Patient/caregiver able to verbalize understanding.  Discharge Medication: Allergies as of 06/11/2018      Reactions   Prednisone Swelling, Other (See Comments)   Excessive fluid buildup   Lisinopril Cough      Medication List    TAKE these medications   albuterol 108 (90 Base) MCG/ACT inhaler Commonly known as:  PROVENTIL HFA;VENTOLIN HFA Inhale 1-2 puffs into the lungs every 6 (six) hours as needed for wheezing or shortness of breath.   aspirin EC 81 MG tablet Take 81 mg by mouth daily.   atorvastatin 20 MG tablet Commonly known as:  LIPITOR Take 20 mg by mouth at bedtime.   budesonide-formoterol 160-4.5 MCG/ACT inhaler Commonly known as:  SYMBICORT Inhale 1 puff into the lungs daily as needed (for respiratory issues).   carvedilol 12.5 MG tablet Commonly known as:  COREG Take 12.5 mg by mouth 2 (two) times daily with a meal.   cinacalcet 60 MG tablet Commonly known as:  SENSIPAR Take 60 mg by mouth daily. Pt. Gets at dialysis   clopidogrel 75 MG tablet Commonly known as:  PLAVIX Take 75 mg by mouth daily.   diazepam 5 MG tablet Commonly known as:  VALIUM Take 5 mg by mouth 2 (two) times daily as needed for anxiety.   fluticasone 50 MCG/ACT nasal spray Commonly known as:  FLONASE Place 1 spray into both nostrils daily as needed for allergies or rhinitis.   gabapentin 100 MG capsule Commonly known as:  NEURONTIN TAKE 1 CAPSULE (100 MG TOTAL) BY MOUTH AT BEDTIME. WHEN NECESSARY FOR NEUROPATHY PAIN   hydrocerin Crea Apply 401 application topically 2 (two) times daily. What changed:  how much to take   insulin NPH Human 100 UNIT/ML injection Commonly known as:  HUMULIN N,NOVOLIN N Inject 0.1 mLs (10 Units total) into the skin at bedtime.   insulin regular 100  units/mL injection Commonly known as:  NOVOLIN R,HUMULIN R Inject 3-8 Units into the skin 3 (three) times daily before meals. Per sliding scale on non-dialysis days   isosorbide-hydrALAZINE 20-37.5 MG tablet Commonly known as:  BIDIL Take 1 tablet by mouth 3 (three) times daily.   lidocaine (PF) 1 % Soln injection Commonly known as:  XYLOCAINE Inject 5 mLs into the skin as needed (topical anesthesia for hemodialysis ifGEBAUERS is ineffective.).   lidocaine-prilocaine cream Commonly known as:  EMLA Apply 1 application topically 3 (three) times a week. 1-2 hours prior to dialysis   loratadine 10 MG tablet Commonly known as:  CLARITIN Take 10 mg by mouth daily as needed for allergies.   multivitamin Tabs tablet Take 1 tablet by mouth daily.   omeprazole 20 MG capsule Commonly known as:  PRILOSEC Take 20 mg by mouth daily.   oxyCODONE-acetaminophen 5-325 MG tablet Commonly known as:  PERCOCET/ROXICET Take 1 tablet by mouth every 4 (four) hours as needed for severe pain.   polyethylene glycol packet Commonly known as:  MIRALAX / GLYCOLAX Take 17 g by mouth daily. What changed:    when to take this  reasons to take this   senna 8.6 MG tablet Commonly known as:  SENOKOT Take 1 tablet by mouth as needed for constipation.       Discharge Assessment: Vitals:   06/11/18 1143 06/11/18 1219  BP: 128/87 Marland Kitchen)  148/61  Pulse: 79 84  Resp: 19   Temp: 98.7 F (37.1 C) 98.3 F (36.8 C)  SpO2: 97% 95%   Skin clean, dry and intact without evidence of skin break down, no evidence of skin tears noted. IV catheter discontinued intact. Site without signs and symptoms of complications - no redness or edema noted at insertion site, patient denies c/o pain - only slight tenderness at site.  Dressing with slight pressure applied.  D/c Instructions-Education: Discharge instructions given to patient/family with verbalized understanding. D/c education completed with patient/family  including follow up instructions, medication list, d/c activities limitations if indicated, with other d/c instructions as indicated by MD - patient able to verbalize understanding, all questions fully answered. Patient instructed to return to ED, call 911, or call MD for any changes in condition.  Patient escorted via [place], and D/C home via private auto.  Eda Keys, RN 06/11/2018 12:52 PM

## 2018-06-11 NOTE — Procedures (Signed)
Stable on HD today, for dc home after session.   I was present at this dialysis session, have reviewed the session itself and made  appropriate changes Kelly Splinter MD [place] pager [phone]   06/11/2018, 12:52 PM

## 2018-06-11 NOTE — Progress Notes (Addendum)
[facility] Brief Note=  On Hd with no cos , noted dc plans today / Yesterday R knee arthroscopy and debridement . Bloody knee aspirate sent for cultures. Noted Dr Sharol Given states no signs of Infection   No antibiotics at discharge/ use tight hep at op center next tx [facility] 06/13/18   Dialysis Orders:  [facility] MWF 4h BFR 400/800 EDW 62kg 3K/2.25 CA UF Profile 4 LUE AVG Hep 2500 Mircera 161mcg  q 2 weeks (Last 11/6)   Ernest Haber, PA-C [facility] Beeper [phone] 06/11/2018,11:41 AM  LOS: 0 days

## 2018-06-11 NOTE — Progress Notes (Signed)
Patient ID: Destiny Day, female   DOB: [birth year], 67 y.o.   MRN: [id number] Arthroscopic findings showed degenerative articular changes secondary to her previous infection there was also synovial thickening but no purulence no signs of infection.  Patient is able to weight-bear without difficulty.  Plan for discharge after dialysis.

## 2018-06-11 NOTE — Discharge Summary (Signed)
Discharge Diagnoses:  Active Problems:   Loose body in knee, right   Septic arthritis of knee, right (HCC)   Old bucket handle tear of lateral meniscus of right knee   Old peripheral tear of medial meniscus of right knee   Surgeries: Procedure(s): RIGHT KNEE ARTHROSCOPY AND DEBRIDEMENT on 06/10/2018    Consultants: Treatment Team:  Roney Jaffe, MD  Discharged Condition: Improved  Hospital Course: Destiny Day is an 67 y.o. female who was admitted 06/10/2018 with a chief complaint of effusion right knee, with a final diagnosis of Effusion Right Knee.  Patient was brought to the operating room on 06/10/2018 and underwent Procedure(s): RIGHT KNEE ARTHROSCOPY AND DEBRIDEMENT.    Patient was given perioperative antibiotics:  Anti-infectives (From admission, onward)   None    .  Patient was given sequential compression devices, early ambulation, and aspirin for DVT prophylaxis.  Recent vital signs:  Patient Vitals for the past 24 hrs:  BP Temp Temp src Pulse Resp SpO2 Weight  06/11/18 0930 (!) 169/70 - - 84 - - -  06/11/18 0900 (!) 153/60 - - 79 - - -  06/11/18 0830 (!) 169/72 - - 80 - - -  06/11/18 0822 (!) 158/64 - - 83 18 - -  06/11/18 0817 (!) 154/52 - - 84 - - -  06/11/18 0805 (!) 161/59 98.9 F (37.2 C) Oral 83 16 96 % 66.3 kg  06/11/18 0500 (!) 128/58 98 F (36.7 C) Oral 80 15 94 % -  06/10/18 2000 (!) 150/64 97.9 F (36.6 C) Oral 81 17 95 % -  06/10/18 1332 - - - - - 94 % -  06/10/18 1043 (!) 156/63 - - 69 17 100 % -  06/10/18 1016 (!) 150/68 - - 65 10 100 % -  .  Recent laboratory studies: No results found.  Discharge Medications:   Allergies as of 06/11/2018      Reactions   Prednisone Swelling, Other (See Comments)   Excessive fluid buildup   Lisinopril Cough      Medication List    TAKE these medications   albuterol 108 (90 Base) MCG/ACT inhaler Commonly known as:  PROVENTIL HFA;VENTOLIN HFA Inhale 1-2 puffs into the lungs every 6 (six)  hours as needed for wheezing or shortness of breath.   aspirin EC 81 MG tablet Take 81 mg by mouth daily.   atorvastatin 20 MG tablet Commonly known as:  LIPITOR Take 20 mg by mouth at bedtime.   budesonide-formoterol 160-4.5 MCG/ACT inhaler Commonly known as:  SYMBICORT Inhale 1 puff into the lungs daily as needed (for respiratory issues).   carvedilol 12.5 MG tablet Commonly known as:  COREG Take 12.5 mg by mouth 2 (two) times daily with a meal.   cinacalcet 60 MG tablet Commonly known as:  SENSIPAR Take 60 mg by mouth daily. Pt. Gets at dialysis   clopidogrel 75 MG tablet Commonly known as:  PLAVIX Take 75 mg by mouth daily.   diazepam 5 MG tablet Commonly known as:  VALIUM Take 5 mg by mouth 2 (two) times daily as needed for anxiety.   fluticasone 50 MCG/ACT nasal spray Commonly known as:  FLONASE Place 1 spray into both nostrils daily as needed for allergies or rhinitis.   gabapentin 100 MG capsule Commonly known as:  NEURONTIN TAKE 1 CAPSULE (100 MG TOTAL) BY MOUTH AT BEDTIME. WHEN NECESSARY FOR NEUROPATHY PAIN   hydrocerin Crea Apply 623 application topically 2 (two) times daily. What changed:  how much to take   insulin NPH Human 100 UNIT/ML injection Commonly known as:  HUMULIN N,NOVOLIN N Inject 0.1 mLs (10 Units total) into the skin at bedtime.   insulin regular 100 units/mL injection Commonly known as:  NOVOLIN R,HUMULIN R Inject 3-8 Units into the skin 3 (three) times daily before meals. Per sliding scale on non-dialysis days   isosorbide-hydrALAZINE 20-37.5 MG tablet Commonly known as:  BIDIL Take 1 tablet by mouth 3 (three) times daily.   lidocaine (PF) 1 % Soln injection Commonly known as:  XYLOCAINE Inject 5 mLs into the skin as needed (topical anesthesia for hemodialysis ifGEBAUERS is ineffective.).   lidocaine-prilocaine cream Commonly known as:  EMLA Apply 1 application topically 3 (three) times a week. 1-2 hours prior to dialysis    loratadine 10 MG tablet Commonly known as:  CLARITIN Take 10 mg by mouth daily as needed for allergies.   multivitamin Tabs tablet Take 1 tablet by mouth daily.   omeprazole 20 MG capsule Commonly known as:  PRILOSEC Take 20 mg by mouth daily.   oxyCODONE-acetaminophen 5-325 MG tablet Commonly known as:  PERCOCET/ROXICET Take 1 tablet by mouth every 4 (four) hours as needed for severe pain.   polyethylene glycol packet Commonly known as:  MIRALAX / GLYCOLAX Take 17 g by mouth daily. What changed:    when to take this  reasons to take this   senna 8.6 MG tablet Commonly known as:  SENOKOT Take 1 tablet by mouth as needed for constipation.       Diagnostic Studies: No results found.  Patient benefited maximally from their hospital stay and there were no complications.     Disposition: Discharge disposition: 01-Home or Self Care      Discharge Instructions    Call MD / Call 911   Complete by:  As directed    If you experience chest pain or shortness of breath, CALL 911 and be transported to the hospital emergency room.  If you develope a fever above 101 F, pus (white drainage) or increased drainage or redness at the wound, or calf pain, call your surgeon's office.   Constipation Prevention   Complete by:  As directed    Drink plenty of fluids.  Prune juice may be helpful.  You may use a stool softener, such as Colace (over the counter) 100 mg twice a day.  Use MiraLax (over the counter) for constipation as needed.   Diet - low sodium heart healthy   Complete by:  As directed    Increase activity slowly as tolerated   Complete by:  As directed      Follow-up Information    Newt Minion, MD In 1 week.   Specialty:  Orthopedic Surgery Contact information: [place] [address] [phone]            Signed: Newt Minion 06/11/2018, 10:01 AM

## 2018-06-12 ENCOUNTER — Encounter (HOSPITAL_COMMUNITY): Payer: Self-pay | Admitting: Orthopedic Surgery

## 2018-06-13 ENCOUNTER — Other Ambulatory Visit: Payer: Self-pay | Admitting: *Deleted

## 2018-06-13 ENCOUNTER — Encounter: Payer: Self-pay | Admitting: *Deleted

## 2018-06-13 DIAGNOSIS — N2581 Secondary hyperparathyroidism of renal origin: Secondary | ICD-10-CM | POA: Diagnosis not present

## 2018-06-13 DIAGNOSIS — N186 End stage renal disease: Secondary | ICD-10-CM | POA: Diagnosis not present

## 2018-06-13 DIAGNOSIS — D631 Anemia in chronic kidney disease: Secondary | ICD-10-CM | POA: Diagnosis not present

## 2018-06-13 DIAGNOSIS — E1122 Type 2 diabetes mellitus with diabetic chronic kidney disease: Secondary | ICD-10-CM | POA: Diagnosis not present

## 2018-06-13 DIAGNOSIS — E876 Hypokalemia: Secondary | ICD-10-CM | POA: Diagnosis not present

## 2018-06-13 DIAGNOSIS — M0000 Staphylococcal arthritis, unspecified joint: Secondary | ICD-10-CM | POA: Diagnosis not present

## 2018-06-13 LAB — ANAEROBIC AND AEROBIC CULTURE
AER RESULT:: NO GROWTH
MICRO NUMBER:: 91361738
MICRO NUMBER:: 91361739
SPECIMEN QUALITY:: ADEQUATE
SPECIMEN QUALITY:: ADEQUATE

## 2018-06-13 NOTE — Anesthesia Postprocedure Evaluation (Signed)
Anesthesia Post Note  Patient: [name]  Procedure(s) Performed: RIGHT KNEE ARTHROSCOPY AND DEBRIDEMENT (Right )     Patient location during evaluation: PACU Anesthesia Type: General Level of consciousness: awake and alert Pain management: pain level controlled Vital Signs Assessment: post-procedure vital signs reviewed and stable Respiratory status: spontaneous breathing, nonlabored ventilation and respiratory function stable Cardiovascular status: blood pressure returned to baseline and stable Postop Assessment: no apparent nausea or vomiting Anesthetic complications: no    Last Vitals:  Vitals:   06/11/18 1143 06/11/18 1219  BP: 128/87 (!) 148/61  Pulse: 79 84  Resp: 19   Temp: 37.1 C 36.8 C  SpO2: 97% 95%    Last Pain:  Vitals:   06/11/18 1219  TempSrc: Oral  PainSc:    Pain Goal: Patients Stated Pain Goal: 2 (06/10/18 0639)               Lidia Collum

## 2018-06-13 NOTE — Patient Outreach (Signed)
[facility]) Care Management  06/13/2018  AIMAR SHREWSBURY [birth year] [id number]   Unsuccessful outreach (2nd attempt) however RN able to leave a HIPAA approved voice message requesting a call back. Will attempt another outreach call over the next week for pending [facility] services. Note also post-op call via ED visit for 11/15-11/16.   Raina Mina, RN Care Management Coordinator [place] Office [phone]

## 2018-06-14 DIAGNOSIS — J449 Chronic obstructive pulmonary disease, unspecified: Secondary | ICD-10-CM | POA: Diagnosis not present

## 2018-06-14 DIAGNOSIS — E1151 Type 2 diabetes mellitus with diabetic peripheral angiopathy without gangrene: Secondary | ICD-10-CM | POA: Diagnosis not present

## 2018-06-14 DIAGNOSIS — E1122 Type 2 diabetes mellitus with diabetic chronic kidney disease: Secondary | ICD-10-CM | POA: Diagnosis not present

## 2018-06-14 DIAGNOSIS — N186 End stage renal disease: Secondary | ICD-10-CM | POA: Diagnosis not present

## 2018-06-14 DIAGNOSIS — Z471 Aftercare following joint replacement surgery: Secondary | ICD-10-CM | POA: Diagnosis not present

## 2018-06-14 DIAGNOSIS — I120 Hypertensive chronic kidney disease with stage 5 chronic kidney disease or end stage renal disease: Secondary | ICD-10-CM | POA: Diagnosis not present

## 2018-06-15 ENCOUNTER — Telehealth (INDEPENDENT_AMBULATORY_CARE_PROVIDER_SITE_OTHER): Payer: Self-pay

## 2018-06-15 DIAGNOSIS — N2581 Secondary hyperparathyroidism of renal origin: Secondary | ICD-10-CM | POA: Diagnosis not present

## 2018-06-15 DIAGNOSIS — N186 End stage renal disease: Secondary | ICD-10-CM | POA: Diagnosis not present

## 2018-06-15 DIAGNOSIS — E1122 Type 2 diabetes mellitus with diabetic chronic kidney disease: Secondary | ICD-10-CM | POA: Diagnosis not present

## 2018-06-15 DIAGNOSIS — M0000 Staphylococcal arthritis, unspecified joint: Secondary | ICD-10-CM | POA: Diagnosis not present

## 2018-06-15 DIAGNOSIS — E876 Hypokalemia: Secondary | ICD-10-CM | POA: Diagnosis not present

## 2018-06-15 DIAGNOSIS — D631 Anemia in chronic kidney disease: Secondary | ICD-10-CM | POA: Diagnosis not present

## 2018-06-15 NOTE — Telephone Encounter (Signed)
Verbal given for HHN, OT and PT for the pt right knee scope and debridement

## 2018-06-16 ENCOUNTER — Other Ambulatory Visit: Payer: Self-pay | Admitting: *Deleted

## 2018-06-16 ENCOUNTER — Inpatient Hospital Stay (INDEPENDENT_AMBULATORY_CARE_PROVIDER_SITE_OTHER): Payer: Medicare Other | Admitting: Orthopedic Surgery

## 2018-06-16 DIAGNOSIS — E1151 Type 2 diabetes mellitus with diabetic peripheral angiopathy without gangrene: Secondary | ICD-10-CM | POA: Diagnosis not present

## 2018-06-16 DIAGNOSIS — N186 End stage renal disease: Secondary | ICD-10-CM | POA: Diagnosis not present

## 2018-06-16 DIAGNOSIS — J449 Chronic obstructive pulmonary disease, unspecified: Secondary | ICD-10-CM | POA: Diagnosis not present

## 2018-06-16 DIAGNOSIS — E1122 Type 2 diabetes mellitus with diabetic chronic kidney disease: Secondary | ICD-10-CM | POA: Diagnosis not present

## 2018-06-16 DIAGNOSIS — Z471 Aftercare following joint replacement surgery: Secondary | ICD-10-CM | POA: Diagnosis not present

## 2018-06-16 DIAGNOSIS — I120 Hypertensive chronic kidney disease with stage 5 chronic kidney disease or end stage renal disease: Secondary | ICD-10-CM | POA: Diagnosis not present

## 2018-06-16 NOTE — Patient Outreach (Signed)
[facility]) Care Management  06/16/2018  MAITLAND MUHLBAUER 20-Dec-1950 [id number]   Transition of care  RN attempted another outreach however remains unsuccessful and unable to leave a HIPAA approved voice message. Will rescheduled another outreach call for engagement for [facility] services.   Raina Mina, RN Care Management Coordinator [place] Office [phone]

## 2018-06-17 ENCOUNTER — Other Ambulatory Visit: Payer: Self-pay

## 2018-06-17 DIAGNOSIS — E1122 Type 2 diabetes mellitus with diabetic chronic kidney disease: Secondary | ICD-10-CM | POA: Diagnosis not present

## 2018-06-17 DIAGNOSIS — M0000 Staphylococcal arthritis, unspecified joint: Secondary | ICD-10-CM | POA: Diagnosis not present

## 2018-06-17 DIAGNOSIS — E876 Hypokalemia: Secondary | ICD-10-CM | POA: Diagnosis not present

## 2018-06-17 DIAGNOSIS — D631 Anemia in chronic kidney disease: Secondary | ICD-10-CM | POA: Diagnosis not present

## 2018-06-17 DIAGNOSIS — N186 End stage renal disease: Secondary | ICD-10-CM | POA: Diagnosis not present

## 2018-06-17 DIAGNOSIS — N2581 Secondary hyperparathyroidism of renal origin: Secondary | ICD-10-CM | POA: Diagnosis not present

## 2018-06-17 NOTE — Patient Outreach (Signed)
[place]) Care Management  06/17/2018  Destiny Day 1950-12-19 [id number]  Successful outreach to the patient on today's date, HIPAA identifiers confirmed. BSW contacted patient to follow up on community resource needs. The patient reports her knee surgery went well. She is currently receiving home health PT/OT from [place]. The patient reports difficulty surrounds therapy schedule as she has dialysis 3 times weekly on Monday, Wednesday, and Friday. The patient states her therapists try to make visits after the patient returns home and she is too tired to participate. The patient denies any community resource needs except a "stair lift".   It is reported the patients second floor has a flight of 14 steps. The patient is only able to accomplish 3 steps. At this time, the patient and her spouse have moved their bedroom onto the first floor but they wish to return to their original room. BSW discussed concerns in obtaining equipment as BSW is unsure if many agencies can assist. The patient stated understanding. The patient is agreeable to this BSW looking into available resources and contacting at a later date.  BSW discussed recent unsuccessful outreach attempts by [facility] RN CM to the patient. BSW attempted to provide the patient with her contact information. Unfortunately, the patient is on the way to dialysis and unable to write RN CM number down. BSW encouraged the patient to call the RN CM back to avoid a case closure. The patient stated understanding.  Plan: BSW to follow up with the patient within the next three weeks to discuss available resources.  Daneen Schick, BSW, CDP [facility] [phone]

## 2018-06-18 ENCOUNTER — Emergency Department (HOSPITAL_COMMUNITY): Payer: Medicare Other

## 2018-06-18 ENCOUNTER — Other Ambulatory Visit: Payer: Self-pay

## 2018-06-18 ENCOUNTER — Inpatient Hospital Stay (HOSPITAL_COMMUNITY)
Admission: EM | Admit: 2018-06-18 | Discharge: 2018-06-20 | DRG: 291 | Disposition: A | Discharge status: Home / Self Care | Payer: Medicare Other | Attending: Internal Medicine | Admitting: Internal Medicine

## 2018-06-18 ENCOUNTER — Encounter (HOSPITAL_COMMUNITY): Payer: Self-pay

## 2018-06-18 DIAGNOSIS — E782 Mixed hyperlipidemia: Secondary | ICD-10-CM

## 2018-06-18 DIAGNOSIS — D638 Anemia in other chronic diseases classified elsewhere: Secondary | ICD-10-CM | POA: Diagnosis present

## 2018-06-18 DIAGNOSIS — K219 Gastro-esophageal reflux disease without esophagitis: Secondary | ICD-10-CM | POA: Diagnosis present

## 2018-06-18 DIAGNOSIS — I509 Heart failure, unspecified: Secondary | ICD-10-CM | POA: Diagnosis not present

## 2018-06-18 DIAGNOSIS — J9601 Acute respiratory failure with hypoxia: Secondary | ICD-10-CM | POA: Diagnosis not present

## 2018-06-18 DIAGNOSIS — N184 Chronic kidney disease, stage 4 (severe): Secondary | ICD-10-CM | POA: Diagnosis not present

## 2018-06-18 DIAGNOSIS — I5033 Acute on chronic diastolic (congestive) heart failure: Secondary | ICD-10-CM | POA: Diagnosis present

## 2018-06-18 DIAGNOSIS — R0789 Other chest pain: Secondary | ICD-10-CM | POA: Diagnosis not present

## 2018-06-18 DIAGNOSIS — Z7902 Long term (current) use of antithrombotics/antiplatelets: Secondary | ICD-10-CM

## 2018-06-18 DIAGNOSIS — E8889 Other specified metabolic disorders: Secondary | ICD-10-CM | POA: Diagnosis present

## 2018-06-18 DIAGNOSIS — E8770 Fluid overload, unspecified: Secondary | ICD-10-CM

## 2018-06-18 DIAGNOSIS — Z794 Long term (current) use of insulin: Secondary | ICD-10-CM

## 2018-06-18 DIAGNOSIS — I10 Essential (primary) hypertension: Secondary | ICD-10-CM | POA: Diagnosis present

## 2018-06-18 DIAGNOSIS — E1122 Type 2 diabetes mellitus with diabetic chronic kidney disease: Secondary | ICD-10-CM | POA: Diagnosis present

## 2018-06-18 DIAGNOSIS — R42 Dizziness and giddiness: Secondary | ICD-10-CM | POA: Diagnosis not present

## 2018-06-18 DIAGNOSIS — E1151 Type 2 diabetes mellitus with diabetic peripheral angiopathy without gangrene: Secondary | ICD-10-CM | POA: Diagnosis present

## 2018-06-18 DIAGNOSIS — E1100 Type 2 diabetes mellitus with hyperosmolarity without nonketotic hyperglycemic-hyperosmolar coma (NKHHC): Secondary | ICD-10-CM | POA: Diagnosis present

## 2018-06-18 DIAGNOSIS — N2581 Secondary hyperparathyroidism of renal origin: Secondary | ICD-10-CM | POA: Diagnosis not present

## 2018-06-18 DIAGNOSIS — N186 End stage renal disease: Secondary | ICD-10-CM | POA: Diagnosis present

## 2018-06-18 DIAGNOSIS — M810 Age-related osteoporosis without current pathological fracture: Secondary | ICD-10-CM | POA: Diagnosis present

## 2018-06-18 DIAGNOSIS — Z87891 Personal history of nicotine dependence: Secondary | ICD-10-CM

## 2018-06-18 DIAGNOSIS — R0602 Shortness of breath: Secondary | ICD-10-CM | POA: Diagnosis not present

## 2018-06-18 DIAGNOSIS — E1169 Type 2 diabetes mellitus with other specified complication: Secondary | ICD-10-CM | POA: Diagnosis present

## 2018-06-18 DIAGNOSIS — J449 Chronic obstructive pulmonary disease, unspecified: Secondary | ICD-10-CM | POA: Diagnosis present

## 2018-06-18 DIAGNOSIS — Z79899 Other long term (current) drug therapy: Secondary | ICD-10-CM

## 2018-06-18 DIAGNOSIS — R0902 Hypoxemia: Secondary | ICD-10-CM | POA: Diagnosis not present

## 2018-06-18 DIAGNOSIS — J301 Allergic rhinitis due to pollen: Secondary | ICD-10-CM | POA: Diagnosis present

## 2018-06-18 DIAGNOSIS — Z89432 Acquired absence of left foot: Secondary | ICD-10-CM

## 2018-06-18 DIAGNOSIS — N189 Chronic kidney disease, unspecified: Secondary | ICD-10-CM | POA: Diagnosis present

## 2018-06-18 DIAGNOSIS — E11649 Type 2 diabetes mellitus with hypoglycemia without coma: Secondary | ICD-10-CM | POA: Diagnosis present

## 2018-06-18 DIAGNOSIS — Z7982 Long term (current) use of aspirin: Secondary | ICD-10-CM

## 2018-06-18 DIAGNOSIS — Z888 Allergy status to other drugs, medicaments and biological substances status: Secondary | ICD-10-CM

## 2018-06-18 DIAGNOSIS — Z7951 Long term (current) use of inhaled steroids: Secondary | ICD-10-CM

## 2018-06-18 DIAGNOSIS — Z89431 Acquired absence of right foot: Secondary | ICD-10-CM

## 2018-06-18 DIAGNOSIS — E785 Hyperlipidemia, unspecified: Secondary | ICD-10-CM | POA: Diagnosis present

## 2018-06-18 DIAGNOSIS — F411 Generalized anxiety disorder: Secondary | ICD-10-CM | POA: Diagnosis present

## 2018-06-18 DIAGNOSIS — Z992 Dependence on renal dialysis: Secondary | ICD-10-CM

## 2018-06-18 DIAGNOSIS — I132 Hypertensive heart and chronic kidney disease with heart failure and with stage 5 chronic kidney disease, or end stage renal disease: Secondary | ICD-10-CM | POA: Diagnosis not present

## 2018-06-18 DIAGNOSIS — E1142 Type 2 diabetes mellitus with diabetic polyneuropathy: Secondary | ICD-10-CM | POA: Diagnosis present

## 2018-06-18 DIAGNOSIS — R079 Chest pain, unspecified: Secondary | ICD-10-CM | POA: Diagnosis not present

## 2018-06-18 DIAGNOSIS — D631 Anemia in chronic kidney disease: Secondary | ICD-10-CM | POA: Diagnosis present

## 2018-06-18 DIAGNOSIS — I5032 Chronic diastolic (congestive) heart failure: Secondary | ICD-10-CM | POA: Diagnosis not present

## 2018-06-18 DIAGNOSIS — I110 Hypertensive heart disease with heart failure: Secondary | ICD-10-CM | POA: Diagnosis not present

## 2018-06-18 DIAGNOSIS — G4730 Sleep apnea, unspecified: Secondary | ICD-10-CM | POA: Diagnosis present

## 2018-06-18 LAB — CBC WITH DIFFERENTIAL/PLATELET
Abs Immature Granulocytes: 0.06 10*3/uL (ref 0.00–0.07)
Basophils Absolute: 0 10*3/uL (ref 0.0–0.1)
Basophils Relative: 1 %
Eosinophils Absolute: 0.3 10*3/uL (ref 0.0–0.5)
Eosinophils Relative: 5 %
HCT: 35.8 % — ABNORMAL LOW (ref 36.0–46.0)
Hemoglobin: 10.4 g/dL — ABNORMAL LOW (ref 12.0–15.0)
Immature Granulocytes: 1 %
Lymphocytes Relative: 27 %
Lymphs Abs: 1.7 10*3/uL (ref 0.7–4.0)
MCH: 29.2 pg (ref 26.0–34.0)
MCHC: 29.1 g/dL — ABNORMAL LOW (ref 30.0–36.0)
MCV: 100.6 fL — ABNORMAL HIGH (ref 80.0–100.0)
Monocytes Absolute: 0.4 10*3/uL (ref 0.1–1.0)
Monocytes Relative: 6 %
Neutro Abs: 3.8 10*3/uL (ref 1.7–7.7)
Neutrophils Relative %: 60 %
Platelets: 262 10*3/uL (ref 150–400)
RBC: 3.56 MIL/uL — ABNORMAL LOW (ref 3.87–5.11)
RDW: 14.8 % (ref 11.5–15.5)
WBC: 6.3 10*3/uL (ref 4.0–10.5)
nRBC: 0.3 % — ABNORMAL HIGH (ref 0.0–0.2)

## 2018-06-18 LAB — BASIC METABOLIC PANEL
Anion gap: 10 (ref 5–15)
BUN: 10 mg/dL (ref 8–23)
CO2: 29 mmol/L (ref 22–32)
Calcium: 8.8 mg/dL — ABNORMAL LOW (ref 8.9–10.3)
Chloride: 96 mmol/L — ABNORMAL LOW (ref 98–111)
Creatinine, Ser: 3.55 mg/dL — ABNORMAL HIGH (ref 0.44–1.00)
GFR calc Af Amer: 14 mL/min — ABNORMAL LOW (ref >60)
GFR calc non Af Amer: 12 mL/min — ABNORMAL LOW (ref >60)
Glucose, Bld: 93 mg/dL (ref 70–99)
Potassium: 3.9 mmol/L (ref 3.5–5.1)
Sodium: 135 mmol/L (ref 135–145)

## 2018-06-18 LAB — GLUCOSE, CAPILLARY: Glucose-Capillary: 134 mg/dL — ABNORMAL HIGH (ref 70–99)

## 2018-06-18 LAB — BRAIN NATRIURETIC PEPTIDE: B Natriuretic Peptide: 2980.8 pg/mL — ABNORMAL HIGH (ref 0.0–100.0)

## 2018-06-18 MED ORDER — CINACALCET HCL 30 MG PO TABS
60.0000 mg | ORAL_TABLET | ORAL | Status: AC
  Administered 2018-06-19: 60 mg via ORAL
  Filled 2018-06-18: qty 2

## 2018-06-18 MED ORDER — IOPAMIDOL (ISOVUE-370) INJECTION 76%
INTRAVENOUS | Status: AC
  Filled 2018-06-18: qty 100

## 2018-06-18 MED ORDER — LORATADINE 10 MG PO TABS: 10.0000 mg | ORAL_TABLET | Freq: Every day | ORAL | Status: DC | PRN

## 2018-06-18 MED ORDER — HYDRALAZINE HCL 20 MG/ML IJ SOLN
10.0000 mg | Freq: Four times a day (QID) | INTRAMUSCULAR | Status: DC | PRN
  Administered 2018-06-19 – 2018-06-20 (×2): 10 mg via INTRAVENOUS
  Filled 2018-06-18: qty 1

## 2018-06-18 MED ORDER — HEPARIN SODIUM (PORCINE) 5000 UNIT/ML IJ SOLN
5000.0000 [IU] | Freq: Three times a day (TID) | INTRAMUSCULAR | Status: DC
  Administered 2018-06-18 – 2018-06-20 (×5): 5000 [IU] via SUBCUTANEOUS
  Filled 2018-06-18 (×5): qty 1

## 2018-06-18 MED ORDER — DIAZEPAM 5 MG PO TABS
5.0000 mg | ORAL_TABLET | Freq: Two times a day (BID) | ORAL | Status: DC | PRN
  Administered 2018-06-18: 5 mg via ORAL
  Filled 2018-06-18: qty 1

## 2018-06-18 MED ORDER — SODIUM CHLORIDE 0.9% FLUSH: 3.0000 mL | INTRAVENOUS | Status: DC | PRN

## 2018-06-18 MED ORDER — MOMETASONE FURO-FORMOTEROL FUM 200-5 MCG/ACT IN AERO
2.0000 | INHALATION_SPRAY | Freq: Two times a day (BID) | RESPIRATORY_TRACT | Status: DC
  Administered 2018-06-19: 2 via RESPIRATORY_TRACT
  Filled 2018-06-18 (×2): qty 8.8

## 2018-06-18 MED ORDER — GABAPENTIN 100 MG PO CAPS
100.0000 mg | ORAL_CAPSULE | Freq: Every day | ORAL | Status: DC
  Administered 2018-06-18 – 2018-06-19 (×2): 100 mg via ORAL
  Filled 2018-06-18 (×2): qty 1

## 2018-06-18 MED ORDER — OXYCODONE-ACETAMINOPHEN 5-325 MG PO TABS: 1.0000 | ORAL_TABLET | ORAL | Status: DC | PRN

## 2018-06-18 MED ORDER — SENNOSIDES 8.6 MG PO TABS: 1.0000 | ORAL_TABLET | ORAL | Status: DC | PRN

## 2018-06-18 MED ORDER — CLOPIDOGREL BISULFATE 75 MG PO TABS
75.0000 mg | ORAL_TABLET | Freq: Every day | ORAL | Status: DC
  Administered 2018-06-19 – 2018-06-20 (×2): 75 mg via ORAL
  Filled 2018-06-18 (×2): qty 1

## 2018-06-18 MED ORDER — ALBUTEROL SULFATE (2.5 MG/3ML) 0.083% IN NEBU: 2.5000 mg | INHALATION_SOLUTION | Freq: Four times a day (QID) | RESPIRATORY_TRACT | Status: DC | PRN

## 2018-06-18 MED ORDER — FLUTICASONE PROPIONATE 50 MCG/ACT NA SUSP
1.0000 | Freq: Every day | NASAL | Status: DC | PRN
  Filled 2018-06-18: qty 16

## 2018-06-18 MED ORDER — SODIUM CHLORIDE 0.9 % IV SOLN: 250.0000 mL | INTRAVENOUS | Status: DC | PRN

## 2018-06-18 MED ORDER — INSULIN NPH (HUMAN) (ISOPHANE) 100 UNIT/ML ~~LOC~~ SUSP
10.0000 [IU] | Freq: Every day | SUBCUTANEOUS | Status: DC
  Administered 2018-06-18 – 2018-06-19 (×2): 10 [IU] via SUBCUTANEOUS
  Filled 2018-06-18: qty 10

## 2018-06-18 MED ORDER — PANTOPRAZOLE SODIUM 40 MG PO TBEC
40.0000 mg | DELAYED_RELEASE_TABLET | Freq: Every day | ORAL | Status: DC
  Administered 2018-06-19 – 2018-06-20 (×2): 40 mg via ORAL
  Filled 2018-06-18 (×2): qty 1

## 2018-06-18 MED ORDER — POLYETHYLENE GLYCOL 3350 17 G PO PACK: 17.0000 g | PACK | Freq: Every day | ORAL | Status: DC | PRN

## 2018-06-18 MED ORDER — ASPIRIN EC 81 MG PO TBEC
81.0000 mg | DELAYED_RELEASE_TABLET | Freq: Every day | ORAL | Status: DC
  Administered 2018-06-18 – 2018-06-20 (×3): 81 mg via ORAL
  Filled 2018-06-18 (×3): qty 1

## 2018-06-18 MED ORDER — RENA-VITE PO TABS
1.0000 | ORAL_TABLET | Freq: Every day | ORAL | Status: DC
  Administered 2018-06-19 – 2018-06-20 (×2): 1 via ORAL
  Filled 2018-06-18 (×2): qty 1

## 2018-06-18 MED ORDER — IOPAMIDOL (ISOVUE-370) INJECTION 76%
50.0000 mL | Freq: Once | INTRAVENOUS | Status: AC | PRN
  Administered 2018-06-18: 50 mL via INTRAVENOUS

## 2018-06-18 MED ORDER — CARVEDILOL 12.5 MG PO TABS
12.5000 mg | ORAL_TABLET | Freq: Two times a day (BID) | ORAL | Status: DC
  Administered 2018-06-19: 12.5 mg via ORAL
  Filled 2018-06-18: qty 1

## 2018-06-18 MED ORDER — CINACALCET HCL 30 MG PO TABS: 60.0000 mg | ORAL_TABLET | ORAL | Status: DC

## 2018-06-18 MED ORDER — SENNA 8.6 MG PO TABS: 1.0000 | ORAL_TABLET | Freq: Every day | ORAL | Status: DC | PRN

## 2018-06-18 MED ORDER — LABETALOL HCL 5 MG/ML IV SOLN: 10.0000 mg | INTRAVENOUS | Status: DC | PRN

## 2018-06-18 MED ORDER — INSULIN ASPART 100 UNIT/ML ~~LOC~~ SOLN: 0.0000 [IU] | Freq: Three times a day (TID) | SUBCUTANEOUS | Status: DC

## 2018-06-18 MED ORDER — HYDROCERIN EX CREA
1.0000 "application " | TOPICAL_CREAM | Freq: Two times a day (BID) | CUTANEOUS | Status: DC
  Administered 2018-06-19 – 2018-06-20 (×2): 1 via TOPICAL
  Filled 2018-06-18 (×2): qty 113

## 2018-06-18 MED ORDER — ISOSORB DINITRATE-HYDRALAZINE 20-37.5 MG PO TABS
1.0000 | ORAL_TABLET | Freq: Three times a day (TID) | ORAL | Status: DC
  Administered 2018-06-18 – 2018-06-20 (×4): 1 via ORAL
  Filled 2018-06-18 (×4): qty 1

## 2018-06-18 MED ORDER — SODIUM CHLORIDE 0.9% FLUSH
3.0000 mL | Freq: Two times a day (BID) | INTRAVENOUS | Status: DC
  Administered 2018-06-18 – 2018-06-20 (×3): 3 mL via INTRAVENOUS

## 2018-06-18 MED ORDER — LABETALOL HCL 5 MG/ML IV SOLN
20.0000 mg | Freq: Once | INTRAVENOUS | Status: AC
  Administered 2018-06-18: 20 mg via INTRAVENOUS
  Filled 2018-06-18: qty 4

## 2018-06-18 MED ORDER — ATORVASTATIN CALCIUM 10 MG PO TABS
20.0000 mg | ORAL_TABLET | Freq: Every day | ORAL | Status: DC
  Administered 2018-06-18 – 2018-06-19 (×2): 20 mg via ORAL
  Filled 2018-06-18 (×2): qty 2

## 2018-06-18 MED ORDER — LIDOCAINE HCL (PF) 1 % IJ SOLN: 5.0000 mL | INTRAMUSCULAR | Status: DC | PRN

## 2018-06-18 NOTE — ED Notes (Signed)
Renal/carb modified 1200 mL fl restriction dinner order placed- AH

## 2018-06-18 NOTE — H&P (Addendum)
History and Physical  Destiny Day ACZ:[id number] DOB: [birth year] DOA: 06/18/2018  Referring physician: Renita Papa PA-C PCP: Velna Hatchet, MD  Outpatient Specialists: Renal hemodialysis Patient coming from: Home & is able to ambulate poorly  Chief Complaint: Shortness of breath  HPI: Destiny Day is a 67 y.o. female with medical history significant for end-stage renal disease on hemodialysis on Monday Wednesday of, type 2 diabetes mellitus, diverticulitis, asthma, CHF, peripheral neuropathy, peripheral vascular disease, bilateral forefoot amputation who presents this morning because of shortness of breath she was sitting in bed watching TV when her symptoms suddenly began.  She denies any chest chest pain no nausea vomiting or lightheadedness or diaphoresis or syncope but complained that she was generally weak.  She had right knee arthroscopy by Dr. Sharol Given last week on June 11, 2018 she takes Plavix.  EMS was called and they gave her 1 DuoNeb treatment with mild improvement in her symptoms and she was also given 2 sublingual nitroglycerin she denies fever cough or hemoptysis no prior history of PE or DVT.  She goes to hemodialysis regularly.  She actually had her hemodialysis yesterday and her husband is wondering why she will suddenly become short of breath even though she takes her dialysis regularly as she just had dialysis recently..  Patient had bilateral forefoot amputation left one was done in 2018 in the right in 2019.  She also had brain surgery after a fall in 2018    ED Course: CBC Chem-7 was drawn CT of the chest was chest x-ray which shows pulmonary vascular congestion.  Nephrology was consulted they will take her to dialysis  Review of Systems:  Pt complains of shortness of breath and pain.  In the blames arthralgia  Pt denies any fever chest pain abdominal pain.  Vomiting review of systems are otherwise negative   Past Medical History:  Diagnosis Date  .  Abdominal bruit   . Anemia   . Anxiety   . Arthritis    Osteoarthritis  . Asthma   . Cervical disc disease    "pinced nerve"  . CHF (congestive heart failure) ([place])   . Complication of anesthesia    " after I got home from my last procedure, I started itching."  . Diabetes mellitus    Type II  . Diverticulitis   . ESRD (end stage renal disease) ([place])    dialysis - M/W/F- [place]  . GERD (gastroesophageal reflux disease)    from medications  . GI bleed 03/31/2013  . Head injury 07/2017  . History of hiatal hernia   . Hyperlipidemia   . Hypertension   . Neuropathy    left leg  . Osteoporosis   . Peripheral vascular disease ([place])   . Pneumonia    "very young" and a few years ago  . PONV (postoperative nausea and vomiting)   . Seasonal allergies   . Shortness of breath dyspnea    WIth exertion, when fluid builds  . Sleep apnea    can't afford cpap   Past Surgical History:  Procedure Laterality Date  . A/V SHUNTOGRAM N/A 09/22/2016   Procedure: A/V Shuntogram - left arm;  Surgeon: Serafina Mitchell, MD;  Location: [facility];  Service: Cardiovascular;  Laterality: N/A;  . A/V SHUNTOGRAM N/A 03/22/2018   Procedure: A/V SHUNTOGRAM - left arm;  Surgeon: Serafina Mitchell, MD;  Location: [facility];  Service: Cardiovascular;  Laterality: N/A;  . ABDOMINAL HYSTERECTOMY  1993`  .  AMPUTATION Left 09/01/2016   Procedure: LEFT FOOT TRANSMETATARSAL AMPUTATION;  Surgeon: Newt Minion, MD;  Location: [facility];  Service: Orthopedics;  Laterality: Left;  . AMPUTATION Right 08/11/2017   Procedure: RIGHT GREAT TOE AMPUTATION DIGIT;  Surgeon: Rosetta Posner, MD;  Location: [facility];  Service: Vascular;  Laterality: Right;  . AMPUTATION Right 12/31/2017   Procedure: RIGHT TRANSMETATARSAL AMPUTATION;  Surgeon: Newt Minion, MD;  Location: [place];  Service: Orthopedics;  Laterality: Right;  . AV FISTULA PLACEMENT Left 04/21/2016   Procedure: INSERTION OF ARTERIOVENOUS (AV) GORE-TEX GRAFT ARM  LEFT;  Surgeon: Elam Dutch, MD;  Location: [facility];  Service: Vascular;  Laterality: Left;  . [place] TRANSPOSITION Left 07/10/2014   Procedure: BASCILIC VEIN TRANSPOSITION;  Surgeon: Angelia Mould, MD;  Location: [place];  Service: Vascular;  Laterality: Left;  . BASCILIC VEIN TRANSPOSITION Right 11/08/2014   Procedure: FIRST STAGE BASILIC VEIN TRANSPOSITION;  Surgeon: Angelia Mould, MD;  Location: [facility];  Service: Vascular;  Laterality: Right;  . BASCILIC VEIN TRANSPOSITION Right 01/18/2015   Procedure: SECOND STAGE BASILIC VEIN TRANSPOSITION;  Surgeon: Angelia Mould, MD;  Location: [facility];  Service: Vascular;  Laterality: Right;  . CRANIOTOMY N/A 08/23/2017   Procedure: CRANIOTOMY HEMATOMA EVACUATION SUBDURAL;  Surgeon: Ashok Pall, MD;  Location: [place] [place];  Service: Neurosurgery;  Laterality: N/A;  . CRANIOTOMY Left 08/24/2017   Procedure: CRANIOTOMY FOR RECURRENT ACUTE SUBDURAL HEMATOMA;  Surgeon: Ashok Pall, MD;  Location: [place] [facility];  Service: Neurosurgery;  Laterality: Left;  . ESOPHAGOGASTRODUODENOSCOPY N/A 03/31/2013   Procedure: ESOPHAGOGASTRODUODENOSCOPY (EGD);  Surgeon: Gatha Mayer, MD;  Location: [facility]OS[facility];  Service: Endoscopy;  Laterality: N/A;  . EYE SURGERY     laser surgery  . FEMORAL-POPLITEAL BYPASS GRAFT Left 07/08/2016   Procedure: LEFT  FEMORAL-BELOW KNEE POPLITEAL ARTERY BYPASS GRAFT USING 6MM X 80 CM PROPATEN GORETEX GRAFT WITH RINGS.;  Surgeon: Rosetta Posner, MD;  Location: [facility];  Service: Vascular;  Laterality: Left;  . FEMORAL-POPLITEAL BYPASS GRAFT Right 08/11/2017   Procedure: RIGHT FEMORAL TO BELOW KNEE POPLITEAL ARTERKY  BYPASS GRAFT USING 6MM RINGED PROPATEN GRAFT;  Surgeon: Rosetta Posner, MD;  Location: [facility];  Service: Vascular;  Laterality: Right;  . FISTULOGRAM Left 10/29/2014   Procedure: FISTULOGRAM;  Surgeon: Angelia Mould, MD;  Location: [facility] C[facility] LAB;  Service: Cardiovascular;  Laterality: Left;  . KNEE ARTHROSCOPY Right  05/06/2018   Procedure: RIGHT KNEE ARTHROSCOPY;  Surgeon: Newt Minion, MD;  Location: [place];  Service: Orthopedics;  Laterality: Right;  . KNEE ARTHROSCOPY Right 06/10/2018   Procedure: RIGHT KNEE ARTHROSCOPY AND DEBRIDEMENT;  Surgeon: Newt Minion, MD;  Location: [place];  Service: Orthopedics;  Laterality: Right;  . LIGATION OF ARTERIOVENOUS  FISTULA Left 04/21/2016   Procedure: LIGATION OF ARTERIOVENOUS  FISTULA LEFT ARM;  Surgeon: Elam Dutch, MD;  Location: [place];  Service: Vascular;  Laterality: Left;  . LOWER EXTREMITY ANGIOGRAPHY N/A 04/06/2017   Procedure: Lower Extremity Angiography - Right;  Surgeon: Serafina Mitchell, MD;  Location: [facility] [facility];  Service: Cardiovascular;  Laterality: N/A;  . PATCH ANGIOPLASTY Right 01/18/2015   Procedure: BASILIC VEIN PATCH ANGIOPLASTY USING VASCUGUARD PATCH;  Surgeon: Angelia Mould, MD;  Location: [place];  Service: Vascular;  Laterality: Right;  . PERIPHERAL VASCULAR BALLOON ANGIOPLASTY  09/22/2016   Procedure: Peripheral Vascular Balloon Angioplasty;  Surgeon: Serafina Mitchell, MD;  Location: [facility];  Service: Cardiovascular;;  Lt. Fistula  . PERIPHERAL VASCULAR BALLOON ANGIOPLASTY Left 03/22/2018   Procedure: PERIPHERAL VASCULAR BALLOON ANGIOPLASTY;  Surgeon: Serafina Mitchell, MD;  Location: [facility];  Service: Cardiovascular;  Laterality: Left;  Arm shunt  . PERIPHERAL VASCULAR CATHETERIZATION N/A 06/23/2016   Procedure: Abdominal Aortogram w/Lower Extremity;  Surgeon: Serafina Mitchell, MD;  Location: [facility];  Service: Cardiovascular;  Laterality: N/A;  . PERIPHERAL VASCULAR CATHETERIZATION  06/23/2016   Procedure: Peripheral Vascular Intervention;  Surgeon: Serafina Mitchell, MD;  Location: [place] [facility];  Service: Cardiovascular;;  lt common and external illiac artery    Social History:  reports that she quit smoking about 6 years ago. Her smoking use included cigarettes. She has a 14.00  pack-year smoking history. She has never used smokeless tobacco. She reports that she does not drink alcohol or use drugs.   Allergies  Allergen Reactions  . Prednisone Swelling and Other (See Comments)    Excessive fluid buildup  . Lisinopril Cough    Family History  Problem Relation Age of Onset  . Other Mother        not sure of cause of death  . Diabetes Father   . Pancreatic cancer Maternal Grandmother   . Colon cancer Neg Hx      Prior to Admission medications   Medication Sig Start Date End Date Taking? Authorizing Provider  albuterol (PROVENTIL HFA;VENTOLIN HFA) 108 (90 Base) MCG/ACT inhaler Inhale 1-2 puffs into the lungs every 6 (six) hours as needed for wheezing or shortness of breath. 01/23/18  Yes Allie Bossier, MD  aspirin EC 81 MG tablet Take 81 mg by mouth daily.   Yes [provider]  atorvastatin (LIPITOR) 20 MG tablet Take 20 mg by mouth at bedtime.    Yes [provider]  budesonide-formoterol (SYMBICORT) 160-4.5 MCG/ACT inhaler Inhale 1 puff into the lungs daily as needed (for respiratory issues).    Yes [provider]  carvedilol (COREG) 12.5 MG tablet Take 12.5 mg by mouth 2 (two) times daily with a meal.   Yes [provider]  cinacalcet (SENSIPAR) 60 MG tablet Take 60 mg by mouth daily. Pt. Gets at dialysis 01/06/18  Yes [provider]  clopidogrel (PLAVIX) 75 MG tablet Take 75 mg by mouth daily.   Yes [provider]  diazepam (VALIUM) 5 MG tablet Take 5 mg by mouth 2 (two) times daily as needed for anxiety.   Yes [provider]  fluticasone (FLONASE) 50 MCG/ACT nasal spray Place 1 spray into both nostrils daily as needed for allergies or rhinitis.   Yes [provider]  gabapentin (NEURONTIN) 100 MG capsule TAKE 1 CAPSULE (100 MG TOTAL) BY MOUTH AT BEDTIME. WHEN NECESSARY FOR NEUROPATHY PAIN Patient taking differently: Take 100 mg by mouth at bedtime.  01/11/18  Yes Newt Minion, MD    hydrocerin (EUCERIN) CREA Apply 382 application topically 2 (two) times daily. Patient taking differently: Apply 1 application topically 2 (two) times daily.  09/16/17  Yes Love, Ivan Anchors, PA-C  insulin NPH Human (HUMULIN N,NOVOLIN N) 100 UNIT/ML injection Inject 0.1 mLs (10 Units total) into the skin at bedtime. 01/23/18  Yes Allie Bossier, MD  insulin regular (NOVOLIN R,HUMULIN R) 100 units/mL injection Inject 3-8 Units into the skin 3 (three) times daily before meals. Per sliding scale on non-dialysis days   Yes [provider]  isosorbide-hydrALAZINE (BIDIL) 20-37.5 MG tablet Take 1 tablet by mouth 3 (three) times  daily.    Yes [provider]  lidocaine-prilocaine (EMLA) cream Apply 1 application topically 3 (three) times a week. 1-2 hours prior to dialysis 01/07/18  Yes [provider]  loratadine (CLARITIN) 10 MG tablet Take 10 mg by mouth daily as needed for allergies.   Yes [provider]  multivitamin (RENA-VIT) TABS tablet Take 1 tablet by mouth daily.  01/24/15  Yes [provider]  omeprazole (PRILOSEC) 20 MG capsule Take 20 mg by mouth daily.    Yes [provider]  oxyCODONE-acetaminophen (PERCOCET/ROXICET) 5-325 MG tablet Take 1 tablet by mouth every 4 (four) hours as needed for severe pain. 06/07/18  Yes Rayburn, Neta Mends, PA-C  polyethylene glycol (MIRALAX / GLYCOLAX) packet Take 17 g by mouth daily. Patient taking differently: Take 17 g by mouth daily as needed for moderate constipation.  09/17/17  Yes Love, Ivan Anchors, PA-C  lidocaine, PF, (XYLOCAINE) 1 % SOLN injection Inject 5 mLs into the skin as needed (topical anesthesia for hemodialysis ifGEBAUERS is ineffective.). 01/23/18   Allie Bossier, MD  senna (SENOKOT) 8.6 MG tablet Take 1 tablet by mouth as needed for constipation.    [provider]    Physical Exam: BP (!) 209/134   Pulse 88   Temp 97.6 F (36.4 C) (Oral)   Resp (!) 21   Ht 5\' 5"  (1.651  m)   Wt 62 kg   SpO2 93%   BMI 22.75 kg/m   Exam:  . General: 67 y.o. year-old female well developed well nourished in no acute distress.  Alert and oriented x3. . Cardiovascular: Regular rate and rhythm with no rubs or gallops.  No thyromegaly or JVD noted.  AV fistula is in the left upper extremity with palpable thrill . Respiratory: Bilateral basal rales no wheezing slightly increased effort at rest. Good inspiratory effort. . Abdomen: Soft nontender nondistended with normal bowel sounds x4 quadrants. . Musculoskeletal: No lower extremity edema.  Bilateral forefoot amputation.  Right knee with mild swelling sutures in place no erythema warmth or induration . Skin: No ulcerative lesions noted or rashes, . Psychiatry: Mood is appropriate for condition and setting           Labs on Admission:  Basic Metabolic Panel: Recent Labs  Lab 06/18/18 1419  NA 135  K 3.9  CL 96*  CO2 29  GLUCOSE 93  BUN 10  CREATININE 3.55*  CALCIUM 8.8*   Liver Function Tests: No results for input(s): AST, ALT, ALKPHOS, BILITOT, PROT, ALBUMIN in the last 168 hours. No results for input(s): LIPASE, AMYLASE in the last 168 hours. No results for input(s): AMMONIA in the last 168 hours. CBC: Recent Labs  Lab 06/18/18 1419  WBC 6.3  NEUTROABS 3.8  HGB 10.4*  HCT 35.8*  MCV 100.6*  PLT 262   Cardiac Enzymes: No results for input(s): CKTOTAL, CKMB, CKMBINDEX, TROPONINI in the last 168 hours.  BNP (last 3 results) Recent Labs    06/18/18 1420  BNP 2,980.8*    ProBNP (last 3 results) No results for input(s): PROBNP in the last 8760 hours.  CBG: No results for input(s): GLUCAP in the last 168 hours.  Radiological Exams on Admission: Dg Chest 2 View  Result Date: 06/18/2018 CLINICAL DATA:  Shortness of breath this morning. History of asthma, diabetes, hypertension and congestive heart failure. EXAM: CHEST - 2 VIEW COMPARISON:  02/21/2018 and 01/21/2018. FINDINGS: Stable cardiomegaly  and diffuse aortic atherosclerosis. Interval increased interstitial and fissural thickening consistent with  worsening pulmonary edema. Small bilateral pleural effusions. No acute osseous findings. Telemetry leads overlie the chest. IMPRESSION: Worsening pulmonary edema and small pleural effusions compared with previous study of 4 months ago, consistent with congestive heart failure. Electronically Signed   By: Richardean Sale M.D.   On: 06/18/2018 12:38   Ct Angio Chest Pe W And/or Wo Contrast  Result Date: 06/18/2018 CLINICAL DATA:  Shortness of breath starting this morning. End-stage renal disease. EXAM: CT ANGIOGRAPHY CHEST WITH CONTRAST TECHNIQUE: Multidetector CT imaging of the chest was performed using the standard protocol during bolus administration of intravenous contrast. Multiplanar CT image reconstructions and MIPs were obtained to evaluate the vascular anatomy. CONTRAST:  47mL ISOVUE-370 IOPAMIDOL (ISOVUE-370) INJECTION 76% COMPARISON:  Chest radiograph 06/18/2018 FINDINGS: Cardiovascular: No filling defect is identified in the pulmonary arterial tree to suggest pulmonary embolus. Systemic arterial vascular opacification is only minimal but no acute aortic findings are observed. Dense atherosclerotic calcification of the branch vessels, thoracic aorta, and coronary arteries. Moderate cardiomegaly. Interventricular septal thickness 2.3 cm compatible with left ventricular hypertrophy. Mediastinum/Nodes: Mildly dilated esophagus with air-fluid level. Enlarged AP window lymph node 1.6 cm in short axis on image 47/8. Right hilar node 1.3 cm in short axis on image 61/5. Additional prominence of hilar and infrahilar nodal tissue. Indistinct subcarinal node approximately 1.5 cm in short axis on image 69/5. Lungs/Pleura: Small bilateral pleural effusions with associated passive atelectasis. Secondary pulmonary lobular septal thickening and accentuation especially at the lung apices for example on image  23/6, a characteristic finding for pulmonary edema. Minimal perihilar airspace edema. Airway thickening is present, suggesting bronchitis or reactive airways disease. Upper Abdomen: Unremarkable Musculoskeletal: Thoracic spondylosis. Review of the MIP images confirms the above findings. IMPRESSION: 1. No filling defect is identified in the pulmonary arterial tree to suggest pulmonary embolus. 2. Moderate cardiomegaly with interstitial pulmonary edema and faint perihilar airspace edema. Small bilateral pleural effusions with passive atelectasis. 3. There is abnormal adenopathy in the chest, for example an AP window lymph node measuring 1.6 cm in diameter. These lymph nodes are somewhat indistinctly marginated, and I would tend to favor passive congestion is the likely cause. Strictly speaking, malignancy such as lymphoma is not excluded. 4. Airway thickening is present, suggesting bronchitis or reactive airways disease. 5. Thoracic spondylosis. 6.  Aortic Atherosclerosis (ICD10-I70.0).  Coronary atherosclerosis. Electronically Signed   By: Van Clines M.D.   On: 06/18/2018 17:56    EKG: No acute ischemia or changes per ED MD  Assessment/Plan Present on Admission: . Chronic diastolic CHF (congestive heart failure) ([place]) . (Resolved) HTN (hypertension), malignant . HLD (hyperlipidemia) . Allergic rhinitis due to pollen . Type 2 diabetes mellitus with hyperosmolar nonketotic hyperglycemia ([place]) . CKD (chronic kidney disease) stage 4, GFR 15-29 ml/min (HCC) . Fluid overload  Active Problems:   HLD (hyperlipidemia)   Allergic rhinitis due to pollen   Type 2 diabetes mellitus with hyperosmolar nonketotic hyperglycemia (H[facility])   Chronic diastolic CHF (congestive heart failure) (H[facility])   CKD (chronic kidney disease) stage 4, GFR 15-29 ml/min (HCC)   Fluid overload  1.  Acute fluid overload with congestive heart failure.  Patient will be taking for hemodialysis today  2.  Type 2 diabetes mellitus  stable continue current medicines monitor sugar level with Accu-Cheks and sliding scale coverage.  3.  Acute on chronic congestive heart failure exacerbated by end-stage renal  4.  Chronic kidney disease disease requiring hemodialysis  5.  Uncontrolled  hypertension continue current medication with as needed  labetalol IV   Severity of Illness: The appropriate patient status for this patient is OBSERVATION. Observation status is judged to be reasonable and necessary in order to provide the required intensity of service to ensure the patient's safety. The patient's presenting symptoms, physical exam findings, and initial radiographic and laboratory data in the context of their medical condition is felt to place them at decreased risk for further clinical deterioration. Furthermore, it is anticipated that the patient will be medically stable for discharge from the hospital within 2 midnights of admission. The following factors support the patient status of observation.   " The patient's presenting symptoms include fluid overload needing hemodialysis inpatient. " The physical exam findings include fluid overload. " The initial radiographic and laboratory data are chronic kidney disease end-stage renal disease on hemodialysis.     DVT prophylaxis: Subacute heparin  Code Status: Full  Family Communication: Husband Lawrence at bedside  Disposition Plan: Home within 24 hours after hemodialysis if stable  Consults called: Nephrology  Admission status: Patient    Cristal Deer MD [facility] Pager [phone]  If 7PM-7AM, please contact night-coverage [email or web address] Password [email or web address]  06/18/2018, 7:02 PM

## 2018-06-18 NOTE — ED Notes (Signed)
The pt was given food  okd by ed pa

## 2018-06-18 NOTE — ED Notes (Signed)
The pt is getting g anxious  She reports that she has panic attacks and takes valium  She has not had any today

## 2018-06-18 NOTE — ED Notes (Signed)
To ct

## 2018-06-18 NOTE — ED Notes (Signed)
Pt returned from ct

## 2018-06-18 NOTE — ED Notes (Signed)
pts husband has concerns that the pt has been waiting so long for her c-t angio of the chest.  Call made to c-t  They are coming to ger her now

## 2018-06-18 NOTE — Consult Note (Addendum)
[facility] Renal Consultation Note    Indication for Consultation:  Management of ESRD/hemodialysis; anemia, hypertension/volume and secondary hyperparathyroidism   HPI: Destiny Day is a 67 y.o. female with ESRD on HD, DMT2, HTN, CHF,  PVD, Hx SDH s/p craniotomy.   Presented to ED with sudden onset of SOB. Began last night at home. Uses inhalers, but reportedly couldn't find them. Treated with duoneb,nitro per EMS. CXR showing worsening pulm edema/CHF. CT angio ordered to r/o PE. BNP elevated 2980. Electrolytes stable.   Seen and examined in ED. BP 125/80 HR 87 RR 20. O2 sats 97% on 2L [place]. Not in respiratory distress. No increased WOB. Describes some epigastric discomfort.  Denies cough, CP, N,V,D, appetite has been stable.   [place] MWF. Has been compliant with treatments. . Last HD yesterday. Was essentially at her dry weight before treatment so did not have any UF with HD.   Past Medical History:  Diagnosis Date  . Abdominal bruit   . Anemia   . Anxiety   . Arthritis    Osteoarthritis  . Asthma   . Cervical disc disease    "pinced nerve"  . CHF (congestive heart failure) ([place])   . Complication of anesthesia    " after I got home from my last procedure, I started itching."  . Diabetes mellitus    Type II  . Diverticulitis   . ESRD (end stage renal disease) ([place])    dialysis - M/W/F- [place]  . GERD (gastroesophageal reflux disease)    from medications  . GI bleed 03/31/2013  . Head injury 07/2017  . History of hiatal hernia   . Hyperlipidemia   . Hypertension   . Neuropathy    left leg  . Osteoporosis   . Peripheral vascular disease ([place])   . Pneumonia    "very young" and a few years ago  . PONV (postoperative nausea and vomiting)   . Seasonal allergies   . Shortness of breath dyspnea    WIth exertion, when fluid builds  . Sleep apnea    can't afford cpap   Past Surgical History:  Procedure Laterality Date  .  A/V SHUNTOGRAM N/A 09/22/2016   Procedure: A/V Shuntogram - left arm;  Surgeon: Serafina Mitchell, MD;  Location: [place] [facility];  Service: Cardiovascular;  Laterality: N/A;  . A/V SHUNTOGRAM N/A 03/22/2018   Procedure: A/V SHUNTOGRAM - left arm;  Surgeon: Serafina Mitchell, MD;  Location: [place] [facility];  Service: Cardiovascular;  Laterality: N/A;  . ABDOMINAL HYSTERECTOMY  1993`  . AMPUTATION Left 09/01/2016   Procedure: LEFT FOOT TRANSMETATARSAL AMPUTATION;  Surgeon: Newt Minion, MD;  Location: [place];  Service: Orthopedics;  Laterality: Left;  . AMPUTATION Right 08/11/2017   Procedure: RIGHT GREAT TOE AMPUTATION DIGIT;  Surgeon: Rosetta Posner, MD;  Location: [place];  Service: Vascular;  Laterality: Right;  . AMPUTATION Right 12/31/2017   Procedure: RIGHT TRANSMETATARSAL AMPUTATION;  Surgeon: Newt Minion, MD;  Location: [place];  Service: Orthopedics;  Laterality: Right;  . AV FISTULA PLACEMENT Left 04/21/2016   Procedure: INSERTION OF ARTERIOVENOUS (AV) GORE-TEX GRAFT ARM LEFT;  Surgeon: Elam Dutch, MD;  Location: [place];  Service: Vascular;  Laterality: Left;  . [place] TRANSPOSITION Left 07/10/2014   Procedure: BASCILIC VEIN TRANSPOSITION;  Surgeon: Angelia Mould, MD;  Location: [place];  Service: Vascular;  Laterality: Left;  . BASCILIC VEIN TRANSPOSITION Right 11/08/2014   Procedure:  FIRST STAGE BASILIC VEIN TRANSPOSITION;  Surgeon: Angelia Mould, MD;  Location: [place];  Service: Vascular;  Laterality: Right;  . BASCILIC VEIN TRANSPOSITION Right 01/18/2015   Procedure: SECOND STAGE BASILIC VEIN TRANSPOSITION;  Surgeon: Angelia Mould, MD;  Location: [place];  Service: Vascular;  Laterality: Right;  . CRANIOTOMY N/A 08/23/2017   Procedure: CRANIOTOMY HEMATOMA EVACUATION SUBDURAL;  Surgeon: Ashok Pall, MD;  [place]  Service: Neurosurgery;  Laterality: N/A;  . CRANIOTOMY Left 08/24/2017   Procedure: CRANIOTOMY FOR RECURRENT ACUTE SUBDURAL HEMATOMA;   Surgeon: Ashok Pall, MD;  Location: [place];  Service: Neurosurgery;  Laterality: Left;  . ESOPHAGOGASTRODUODENOSCOPY N/A 03/31/2013   Procedure: ESOPHAGOGASTRODUODENOSCOPY (EGD);  Surgeon: Gatha Mayer, MD;  Location[facility] ENDOSCOPY;  Service: Endoscopy;  Laterality: N/A;  . EYE SURGERY     laser surgery  . FEMORAL-POPLITEAL BYPASS GRAFT Left 07/08/2016   Procedure: LEFT  FEMORAL-BELOW KNEE POPLITEAL ARTERY BYPASS GRAFT USING 6MM X 80 CM PROPATEN GORETEX GRAFT WITH RINGS.;  Surgeon: Rosetta Posner, MD;  Location: [place];  Service: Vascular;  Laterality: Left;  . FEMORAL-POPLITEAL BYPASS GRAFT Right 08/11/2017   Procedure: RIGHT FEMORAL TO BELOW KNEE POPLITEAL ARTERKY  BYPASS GRAFT USING 6MM RINGED PROPATEN GRAFT;  Surgeon: Rosetta Posner, MD;  Location: [place];  Service: Vascular;  Laterality: Right;  . FISTULOGRAM Left 10/29/2014   Procedure: FISTULOGRAM;  Surgeon: Angelia Mould, MD;  Location: [facility] CATH LAB;  Service: Cardiovascular;  Laterality: Left;  . KNEE ARTHROSCOPY Right 05/06/2018   Procedure: RIGHT KNEE ARTHROSCOPY;  Surgeon: Newt Minion, MD;  Location: [place];  Service: Orthopedics;  Laterality: Right;  . KNEE ARTHROSCOPY Right 06/10/2018   Procedure: RIGHT KNEE ARTHROSCOPY AND DEBRIDEMENT;  Surgeon: Newt Minion, MD;  Location: [place];  Service: Orthopedics;  Laterality: Right;  . LIGATION OF ARTERIOVENOUS  FISTULA Left 04/21/2016   Procedure: LIGATION OF ARTERIOVENOUS  FISTULA LEFT ARM;  Surgeon: Elam Dutch, MD;  Location: [place];  Service: Vascular;  Laterality: Left;  . LOWER EXTREMITY ANGIOGRAPHY N/A 04/06/2017   Procedure: Lower Extremity Angiography - Right;  Surgeon: Serafina Mitchell, MD;  Location: [place] CV LAB;  Service: Cardiovascular;  Laterality: N/A;  . PATCH ANGIOPLASTY Right 01/18/2015   Procedure: BASILIC VEIN PATCH ANGIOPLASTY USING VASCUGUARD PATCH;  Surgeon: Angelia Mould, MD;  Location: [place];  Service: Vascular;  Laterality: Right;  . PERIPHERAL  VASCULAR BALLOON ANGIOPLASTY  09/22/2016   Procedure: Peripheral Vascular Balloon Angioplasty;  Surgeon: Serafina Mitchell, MD;  Location: [place] [facility];  Service: Cardiovascular;;  Lt. Fistula  . PERIPHERAL VASCULAR BALLOON ANGIOPLASTY Left 03/22/2018   Procedure: PERIPHERAL VASCULAR BALLOON ANGIOPLASTY;  Surgeon: Serafina Mitchell, MD;  Location: [place] [facility];  Service: Cardiovascular;  Laterality: Left;  Arm shunt  . PERIPHERAL VASCULAR CATHETERIZATION N/A 06/23/2016   Procedure: Abdominal Aortogram w/Lower Extremity;  Surgeon: Serafina Mitchell, MD;  Location: [place] [facility];  Service: Cardiovascular;  Laterality: N/A;  . PERIPHERAL VASCULAR CATHETERIZATION  06/23/2016   Procedure: Peripheral Vascular Intervention;  Surgeon: Serafina Mitchell, MD;  Location: [place] CV [facility];  Service: Cardiovascular;;  lt common and external illiac artery   Family History  Problem Relation Age of Onset  . Other Mother        not sure of cause of death  . Diabetes Father   . Pancreatic cancer Maternal Grandmother   . Colon cancer Neg Hx    Social  History:  reports that she quit smoking about 6 years ago. Her smoking use included cigarettes. She has a 14.00 pack-year smoking history. She has never used smokeless tobacco. She reports that she does not drink alcohol or use drugs. Allergies  Allergen Reactions  . Prednisone Swelling and Other (See Comments)    Excessive fluid buildup  . Lisinopril Cough   Prior to Admission medications   Medication Sig Start Date End Date Taking? Authorizing Provider  albuterol (PROVENTIL HFA;VENTOLIN HFA) 108 (90 Base) MCG/ACT inhaler Inhale 1-2 puffs into the lungs every 6 (six) hours as needed for wheezing or shortness of breath. 01/23/18  Yes Allie Bossier, MD  aspirin EC 81 MG tablet Take 81 mg by mouth daily.   Yes [provider]  atorvastatin (LIPITOR) 20 MG tablet Take 20 mg by mouth at bedtime.    Yes [provider]   budesonide-formoterol (SYMBICORT) 160-4.5 MCG/ACT inhaler Inhale 1 puff into the lungs daily as needed (for respiratory issues).    Yes [provider]  carvedilol (COREG) 12.5 MG tablet Take 12.5 mg by mouth 2 (two) times daily with a meal.   Yes [provider]  cinacalcet (SENSIPAR) 60 MG tablet Take 60 mg by mouth daily. Pt. Gets at dialysis 01/06/18  Yes [provider]  clopidogrel (PLAVIX) 75 MG tablet Take 75 mg by mouth daily.   Yes [provider]  diazepam (VALIUM) 5 MG tablet Take 5 mg by mouth 2 (two) times daily as needed for anxiety.   Yes [provider]  fluticasone (FLONASE) 50 MCG/ACT nasal spray Place 1 spray into both nostrils daily as needed for allergies or rhinitis.   Yes [provider]  gabapentin (NEURONTIN) 100 MG capsule TAKE 1 CAPSULE (100 MG TOTAL) BY MOUTH AT BEDTIME. WHEN NECESSARY FOR NEUROPATHY PAIN Patient taking differently: Take 100 mg by mouth at bedtime.  01/11/18  Yes Newt Minion, MD  hydrocerin (EUCERIN) CREA Apply 191 application topically 2 (two) times daily. Patient taking differently: Apply 1 application topically 2 (two) times daily.  09/16/17  Yes Love, Ivan Anchors, PA-C  insulin NPH Human (HUMULIN N,NOVOLIN N) 100 UNIT/ML injection Inject 0.1 mLs (10 Units total) into the skin at bedtime. 01/23/18  Yes Allie Bossier, MD  insulin regular (NOVOLIN R,HUMULIN R) 100 units/mL injection Inject 3-8 Units into the skin 3 (three) times daily before meals. Per sliding scale on non-dialysis days   Yes [provider]  isosorbide-hydrALAZINE (BIDIL) 20-37.5 MG tablet Take 1 tablet by mouth 3 (three) times daily.    Yes [provider]  lidocaine-prilocaine (EMLA) cream Apply 1 application topically 3 (three) times a week. 1-2 hours prior to dialysis 01/07/18  Yes [provider]  loratadine (CLARITIN) 10 MG tablet Take 10 mg by mouth daily as needed for allergies.   Yes [provider]  multivitamin (RENA-VIT) TABS tablet Take 1 tablet by mouth daily.  01/24/15  Yes [provider]  omeprazole (PRILOSEC) 20 MG capsule Take 20 mg by mouth daily.    Yes [provider]  oxyCODONE-acetaminophen (PERCOCET/ROXICET) 5-325 MG tablet Take 1 tablet by mouth every 4 (four) hours as needed for severe pain. 06/07/18  Yes Rayburn, Neta Mends, PA-C  polyethylene glycol (MIRALAX / GLYCOLAX) packet Take 17 g by mouth daily. Patient taking differently: Take 17 g by mouth daily as needed for moderate constipation.  09/17/17  Yes Love, Ivan Anchors, PA-C  lidocaine, PF, (XYLOCAINE) 1 % SOLN injection  Inject 5 mLs into the skin as needed (topical anesthesia for hemodialysis ifGEBAUERS is ineffective.). 01/23/18   Allie Bossier, MD  senna (SENOKOT) 8.6 MG tablet Take 1 tablet by mouth as needed for constipation.    [provider]   Current Facility-Administered Medications  Medication Dose Route Frequency Provider Last Rate Last Dose  . iopamidol (ISOVUE-370) 76 % injection            Current Outpatient Medications  Medication Sig Dispense Refill  . albuterol (PROVENTIL HFA;VENTOLIN HFA) 108 (90 Base) MCG/ACT inhaler Inhale 1-2 puffs into the lungs every 6 (six) hours as needed for wheezing or shortness of breath. 1 Inhaler 0  . aspirin EC 81 MG tablet Take 81 mg by mouth daily.    Marland Kitchen atorvastatin (LIPITOR) 20 MG tablet Take 20 mg by mouth at bedtime.     . budesonide-formoterol (SYMBICORT) 160-4.5 MCG/ACT inhaler Inhale 1 puff into the lungs daily as needed (for respiratory issues).     . carvedilol (COREG) 12.5 MG tablet Take 12.5 mg by mouth 2 (two) times daily with a meal.    . cinacalcet (SENSIPAR) 60 MG tablet Take 60 mg by mouth daily. Pt. Gets at dialysis  3  . clopidogrel (PLAVIX) 75 MG tablet Take 75 mg by mouth daily.    . diazepam (VALIUM) 5 MG tablet Take 5 mg by mouth 2 (two) times daily as needed for anxiety.    . fluticasone (FLONASE)  50 MCG/ACT nasal spray Place 1 spray into both nostrils daily as needed for allergies or rhinitis.    Marland Kitchen gabapentin (NEURONTIN) 100 MG capsule TAKE 1 CAPSULE (100 MG TOTAL) BY MOUTH AT BEDTIME. WHEN NECESSARY FOR NEUROPATHY PAIN (Patient taking differently: Take 100 mg by mouth at bedtime. ) 30 capsule 3  . hydrocerin (EUCERIN) CREA Apply 833 application topically 2 (two) times daily. (Patient taking differently: Apply 1 application topically 2 (two) times daily. ) 228 g 0  . insulin NPH Human (HUMULIN N,NOVOLIN N) 100 UNIT/ML injection Inject 0.1 mLs (10 Units total) into the skin at bedtime. 10 mL 0  . insulin regular (NOVOLIN R,HUMULIN R) 100 units/mL injection Inject 3-8 Units into the skin 3 (three) times daily before meals. Per sliding scale on non-dialysis days    . isosorbide-hydrALAZINE (BIDIL) 20-37.5 MG tablet Take 1 tablet by mouth 3 (three) times daily.     Marland Kitchen lidocaine-prilocaine (EMLA) cream Apply 1 application topically 3 (three) times a week. 1-2 hours prior to dialysis  12  . loratadine (CLARITIN) 10 MG tablet Take 10 mg by mouth daily as needed for allergies.    . multivitamin (RENA-VIT) TABS tablet Take 1 tablet by mouth daily.   5  . omeprazole (PRILOSEC) 20 MG capsule Take 20 mg by mouth daily.     Marland Kitchen oxyCODONE-acetaminophen (PERCOCET/ROXICET) 5-325 MG tablet Take 1 tablet by mouth every 4 (four) hours as needed for severe pain. 40 tablet 0  . polyethylene glycol (MIRALAX / GLYCOLAX) packet Take 17 g by mouth daily. (Patient taking differently: Take 17 g by mouth daily as needed for moderate constipation. ) 30 each 0  . lidocaine, PF, (XYLOCAINE) 1 % SOLN injection Inject 5 mLs into the skin as needed (topical anesthesia for hemodialysis ifGEBAUERS is ineffective.). 2 mL 0  . senna (SENOKOT) 8.6 MG tablet Take 1 tablet by mouth as needed for constipation.      ROS: As per HPI otherwise negative.  Physical Exam: Vitals:   06/18/18 1149 06/18/18 1200  06/18/18 1300 06/18/18 1315   BP: (!) 188/75 (!) 181/73 (!) 189/83 (!) 184/83  Pulse: 86 81 79 81  Resp: 20 (!) 27 15 (!) 28  Temp: 97.6 F (36.4 C)     TempSrc: Oral     SpO2: 100% 100% 99% 100%  Weight:      Height:         General: Elderly female NAD on nasal O2   Head: NCAT sclera not icteric MMM Neck: Supple. No JVD appreciated  Lungs: Diminished, faint crackles at R base  Heart: RRR with S1 S2 Abdomen: soft NT + BS Lower extremities:without edema or ischemic changes, no open wounds  Neuro: A & O  X 3. Moves all extremities spontaneously. Psych:  Responds to questions appropriately with a normal affect. Dialysis Access: LUE AVG +bruit   Labs: Basic Metabolic Panel: Recent Labs  Lab 06/18/18 1419  NA 135  K 3.9  CL 96*  CO2 29  GLUCOSE 93  BUN 10  CREATININE 3.55*  CALCIUM 8.8*   Liver Function Tests: No results for input(s): AST, ALT, ALKPHOS, BILITOT, PROT, ALBUMIN in the last 168 hours. No results for input(s): LIPASE, AMYLASE in the last 168 hours. No results for input(s): AMMONIA in the last 168 hours. CBC: Recent Labs  Lab 06/18/18 1419  WBC 6.3  NEUTROABS 3.8  HGB 10.4*  HCT 35.8*  MCV 100.6*  PLT 262   Cardiac Enzymes: No results for input(s): CKTOTAL, CKMB, CKMBINDEX, TROPONINI in the last 168 hours. CBG: No results for input(s): GLUCAP in the last 168 hours. Iron Studies: No results for input(s): IRON, TIBC, TRANSFERRIN, FERRITIN in the last 72 hours. Studies/Results: Dg Chest 2 View  Result Date: 06/18/2018 CLINICAL DATA:  Shortness of breath this morning. History of asthma, diabetes, hypertension and congestive heart failure. EXAM: CHEST - 2 VIEW COMPARISON:  02/21/2018 and 01/21/2018. FINDINGS: Stable cardiomegaly and diffuse aortic atherosclerosis. Interval increased interstitial and fissural thickening consistent with worsening pulmonary edema. Small bilateral pleural effusions. No acute osseous findings. Telemetry leads overlie the chest. IMPRESSION: Worsening  pulmonary edema and small pleural effusions compared with previous study of 4 months ago, consistent with congestive heart failure. Electronically Signed   By: Richardean Sale M.D.   On: 06/18/2018 12:38    Dialysis Orders:  [facility] MWF BFR 400 EDW 62kg 3K/2.25Ca Profile 4 AVG Heparin 2500 bolus 1000 mid run  Mircera 150 mcg IV q 2 weeks (last 11/18)  Assessment/Plan: 1. Dyspnea - CXR showing worsening pulmonary edema/CHF. HD for volume removal. May be losing body weight and need EDW adjustment.   2. ESRD -  MWF. Plan HD in am.  Most likely won't be able to dialyze this evening d/t urgent cases.  3. Hypertension/volume  - BP elevated.  On Bidil 20-37.5, Coreg 12.5 bid  Likely volume related as well  Attempt UF 3-4L with HD.  4. Anemia  - Hgb stable. No ESA needs currently  5. Metabolic bone disease -  No VDRA. Continue Lorin Picket binder/Sensipar    Lynnda Child PA-C [facility] Pager [phone] 06/18/2018, 4:46 PM   Pt seen, examined and agree w A/P as above.  Kelly Splinter MD [employer] pager [phone]   06/18/2018, 7:14 PM

## 2018-06-18 NOTE — ED Provider Notes (Signed)
[facility] DEPARTMENT Provider Note   CSN: [id number] Arrival date & time: 06/18/18  1135     History   Chief Complaint Chief Complaint  Patient presents with  . Shortness of Breath    HPI Destiny Day is a 67 y.o. female with history of ESRD on dialysis Monday Wednesday Friday, type 2 diabetes mellitus, diverticulitis, asthma, CHF, peripheral neuropathy, PVD presents for evaluation of acute onset, persistent shortness of breath since this morning at around 9 AM.  She states that she was sitting in bed watching television when her symptoms began.  She denies any associated chest pain, nausea, vomiting, lightheadedness, diaphoresis, or syncope but notes that she feels generally weak.  She had a right knee arthroscopy with Dr. Sharol Given last week on 06/11/2018.  She is currently on Plavix.  She received 1 DuoNeb with EMS with mild improvement in her symptoms and was also given 2 sublingual nitroglycerin.  Again, she denies chest pain.  No fevers, cough, hemoptysis, or prior history of DVT or PE.  Her husband notes that she has been at her "dry weight "while at dialysis so they have "not been taking as much off "during her treatments.  She has been compliant with dialysis.  The history is provided by the patient and the spouse.    Past Medical History:  Diagnosis Date  . Abdominal bruit   . Anemia   . Anxiety   . Arthritis    Osteoarthritis  . Asthma   . Cervical disc disease    "pinced nerve"  . CHF (congestive heart failure) ([place])   . Complication of anesthesia    " after I got home from my last procedure, I started itching."  . Diabetes mellitus    Type II  . Diverticulitis   . ESRD (end stage renal disease) ([place])    dialysis - M/W/F- [place]  . GERD (gastroesophageal reflux disease)    from medications  . GI bleed 03/31/2013  . Head injury 07/2017  . History of hiatal hernia   . Hyperlipidemia   . Hypertension   . Neuropathy    left leg    . Osteoporosis   . Peripheral vascular disease ([place])   . Pneumonia    "very young" and a few years ago  . PONV (postoperative nausea and vomiting)   . Seasonal allergies   . Shortness of breath dyspnea    WIth exertion, when fluid builds  . Sleep apnea    can't afford cpap    Patient Active Problem List   Diagnosis Date Noted  . Old bucket handle tear of lateral meniscus of right knee   . Old peripheral tear of medial meniscus of right knee   . Septic arthritis of knee, right ([place]) 05/06/2018  . Degenerative tear of posterior horn of medial meniscus, right   . Old complex tear of lateral meniscus of right knee   . Loose body in knee, right   . Acute pulmonary edema ([place]) 02/21/2018  . 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[place]) 02/03/2018  . End-stage renal disease on hemodialysis ([place])   . Respiratory failure ([place]) 01/21/2018  . Achilles tendon contracture, right 11/09/2017  . Labile blood glucose   . Anemia of infection and chronic disease   . Essential hypertension   . Black stool   . Right sided weakness   . Subdural hemorrhage following injury ([place]) 08/30/2017  . Diabetic peripheral neuropathy ([place])   . Chronic obstructive pulmonary  disease ([place])   . Seizure prophylaxis   . Subdural hematoma ([place]) 08/24/2017  . Traumatic subdural hematoma ([place]) 08/23/2017  . Fall   . SDH (subdural hematoma) ([place])   . Acute respiratory failure with hypoxia ([place])   . Diabetes mellitus type 2 in nonobese (HCC)   . Leukocytosis   . Labile blood pressure   . Generalized anxiety disorder   . Debility 08/16/2017  . Amputated great toe of right foot ([place])   . Amputated toe of left foot ([place])   . Post-operative pain   . ESRD on dialysis ([place])   . Diabetes mellitus ([place])   . Anxiety state   . Benign essential HTN   . Acute blood loss anemia   . Anemia of chronic disease   . Idiopathic chronic venous hypertension of both lower extremities with inflammation 10/13/2016  .  S/P transmetatarsal amputation of foot, left ([place]) 09/21/2016  . Onychomycosis 08/15/2016  . PAD (peripheral artery disease) ([place]) 07/08/2016  . Atherosclerosis of native artery of left lower extremity with gangrene ([place]) 06/23/2016  . GERD (gastroesophageal reflux disease) 10/07/2015  . ARF (acute renal failure) ([place])   . Hypothermia 06/12/2015  . Acute on chronic renal failure ([place]) 06/12/2015  . CHF (congestive heart failure) ([place]) 07/30/2014  . CKD (chronic kidney disease) stage 4, GFR 15-29 ml/min (HCC) 06/27/2014  . Hypertensive heart disease 05/25/2013  . CKD (chronic kidney disease) stage 3, GFR 30-59 ml/min (HCC) 05/25/2013  . Pulmonary edema 05/23/2013  . Type 2 diabetes mellitus with diabetic nephropathy ([place]) 05/23/2013  . Type 2 diabetes mellitus with hyperosmolar nonketotic hyperglycemia ([place]) 04/13/2013  . Chronic diastolic CHF (congestive heart failure) ([place]) 04/13/2013  . UGI bleed 03/31/2013  . Hypokalemia 08/24/2012  . Type II or unspecified type diabetes mellitus without mention of complication, not stated as uncontrolled 12/27/2007  . HLD (hyperlipidemia) 12/27/2007  . ALLERGIC RHINITIS 12/27/2007  . Asthma 12/27/2007    Past Surgical History:  Procedure Laterality Date  . A/V SHUNTOGRAM N/A 09/22/2016   Procedure: A/V Shuntogram - left arm;  Surgeon: Serafina Mitchell, MD;  Location: [facility];  Service: Cardiovascular;  Laterality: N/A;  . A/V SHUNTOGRAM N/A 03/22/2018   Procedure: A/V SHUNTOGRAM - left arm;  Surgeon: Serafina Mitchell, MD;  Location: [facility];  Service: Cardiovascular;  Laterality: N/A;  . ABDOMINAL HYSTERECTOMY  1993`  . AMPUTATION Left 09/01/2016   Procedure: LEFT FOOT TRANSMETATARSAL AMPUTATION;  Surgeon: Newt Minion, MD;  Location: [facility];  Service: Orthopedics;  Laterality: Left;  . AMPUTATION Right 08/11/2017   Procedure: RIGHT GREAT TOE AMPUTATION DIGIT;  Surgeon: Rosetta Posner, MD;  Location: [facility];  Service: Vascular;   Laterality: Right;  . AMPUTATION Right 12/31/2017   Procedure: RIGHT TRANSMETATARSAL AMPUTATION;  Surgeon: Newt Minion, MD;  Location: [facility];  Service: Orthopedics;  Laterality: Right;  . AV FISTULA PLACEMENT Left 04/21/2016   Procedure: INSERTION OF ARTERIOVENOUS (AV) GORE-TEX GRAFT ARM LEFT;  Surgeon: Elam Dutch, MD;  Location: [facility];  Service: Vascular;  Laterality: Left;  . [place] TRANSPOSITION Left 07/10/2014   Procedure: BASCILIC VEIN TRANSPOSITION;  Surgeon: Angelia Mould, MD;  Location: [facility];  Service: Vascular;  Laterality: Left;  . BASCILIC VEIN TRANSPOSITION Right 11/08/2014   Procedure: FIRST STAGE BASILIC VEIN TRANSPOSITION;  Surgeon: Angelia Mould, MD;  Location: [facility];  Service: Vascular;  Laterality: Right;  . BASCILIC VEIN TRANSPOSITION Right 01/18/2015   Procedure: SECOND  STAGE BASILIC VEIN TRANSPOSITION;  Surgeon: Angelia Mould, MD;  Location: [facility];  Service: Vascular;  Laterality: Right;  . CRANIOTOMY N/A 08/23/2017   Procedure: CRANIOTOMY HEMATOMA EVACUATION SUBDURAL;  Surgeon: Ashok Pall, MD;  Location: [facility];  Service: Neurosurgery;  Laterality: N/A;  . CRANIOTOMY Left 08/24/2017   Procedure: CRANIOTOMY FOR RECURRENT ACUTE SUBDURAL HEMATOMA;  Surgeon: Ashok Pall, MD;  Location: [facility];  Service: Neurosurgery;  Laterality: Left;  . ESOPHAGOGASTRODUODENOSCOPY N/A 03/31/2013   Procedure: ESOPHAGOGASTRODUODENOSCOPY (EGD);  Surgeon: Gatha Mayer, MD;  Location[facility] ENDOSCOPY;  Service: Endoscopy;  Laterality: N/A;  . EYE SURGERY     laser surgery  . FEMORAL-POPLITEAL BYPASS GRAFT Left 07/08/2016   Procedure: LEFT  FEMORAL-BELOW KNEE POPLITEAL ARTERY BYPASS GRAFT USING 6MM X 80 CM PROPATEN GORETEX GRAFT WITH RINGS.;  Surgeon: Rosetta Posner, MD;  Location: [facility];  Service: Vascular;  Laterality: Left;  . FEMORAL-POPLITEAL BYPASS GRAFT Right 08/11/2017   Procedure: RIGHT FEMORAL TO BELOW KNEE POPLITEAL ARTERKY  BYPASS GRAFT USING 6MM RINGED  PROPATEN GRAFT;  Surgeon: Rosetta Posner, MD;  Location: [facility];  Service: Vascular;  Laterality: Right;  . FISTULOGRAM Left 10/29/2014   Procedure: FISTULOGRAM;  Surgeon: Angelia Mould, MD;  Location[facility]ATH LAB;  Service: Cardiovascular;  Laterality: Left;  . KNEE ARTHROSCOPY Right 05/06/2018   Procedure: RIGHT KNEE ARTHROSCOPY;  Surgeon: Newt Minion, MD;  Location: [facility];  Service: Orthopedics;  Laterality: Right;  . KNEE ARTHROSCOPY Right 06/10/2018   Procedure: RIGHT KNEE ARTHROSCOPY AND DEBRIDEMENT;  Surgeon: Newt Minion, MD;  Location: [facility];  Service: Orthopedics;  Laterality: Right;  . LIGATION OF ARTERIOVENOUS  FISTULA Left 04/21/2016   Procedure: LIGATION OF ARTERIOVENOUS  FISTULA LEFT ARM;  Surgeon: Elam Dutch, MD;  Location: [facility];  Service: Vascular;  Laterality: Left;  . LOWER EXTREMITY ANGIOGRAPHY N/A 04/06/2017   Procedure: Lower Extremity Angiography - Right;  Surgeon: Serafina Mitchell, MD;  Location: [facility] LAB;  Service: Cardiovascular;  Laterality: N/A;  . PATCH ANGIOPLASTY Right 01/18/2015   Procedure: BASILIC VEIN PATCH ANGIOPLASTY USING VASCUGUARD PATCH;  Surgeon: Angelia Mould, MD;  Location: [facility];  Service: Vascular;  Laterality: Right;  . PERIPHERAL VASCULAR BALLOON ANGIOPLASTY  09/22/2016   Procedure: Peripheral Vascular Balloon Angioplasty;  Surgeon: Serafina Mitchell, MD;  Location: [facility] LAB;  Service: Cardiovascular;;  Lt. Fistula  . PERIPHERAL VASCULAR BALLOON ANGIOPLASTY Left 03/22/2018   Procedure: PERIPHERAL VASCULAR BALLOON ANGIOPLASTY;  Surgeon: Serafina Mitchell, MD;  Location: [facility] LAB;  Service: Cardiovascular;  Laterality: Left;  Arm shunt  . PERIPHERAL VASCULAR CATHETERIZATION N/A 06/23/2016   Procedure: Abdominal Aortogram w/Lower Extremity;  Surgeon: Serafina Mitchell, MD;  Location: [facility] LAB;  Service: Cardiovascular;  Laterality: N/A;  . PERIPHERAL VASCULAR CATHETERIZATION  06/23/2016   Procedure:  Peripheral Vascular Intervention;  Surgeon: Serafina Mitchell, MD;  Location: [facility];  Service: Cardiovascular;;  lt common and external illiac artery     OB History   None      Home Medications    Prior to Admission medications   Medication Sig Start Date End Date Taking? Authorizing Provider  albuterol (PROVENTIL HFA;VENTOLIN HFA) 108 (90 Base) MCG/ACT inhaler Inhale 1-2 puffs into the lungs every 6 (six) hours as needed for wheezing or shortness of breath. 01/23/18  Yes Allie Bossier, MD  aspirin EC 81 MG tablet Take 81 mg by mouth daily.  Yes [provider]  atorvastatin (LIPITOR) 20 MG tablet Take 20 mg by mouth at bedtime.    Yes [provider]  budesonide-formoterol (SYMBICORT) 160-4.5 MCG/ACT inhaler Inhale 1 puff into the lungs daily as needed (for respiratory issues).    Yes [provider]  carvedilol (COREG) 12.5 MG tablet Take 12.5 mg by mouth 2 (two) times daily with a meal.   Yes [provider]  cinacalcet (SENSIPAR) 60 MG tablet Take 60 mg by mouth daily. Pt. Gets at dialysis 01/06/18  Yes [provider]  clopidogrel (PLAVIX) 75 MG tablet Take 75 mg by mouth daily.   Yes [provider]  diazepam (VALIUM) 5 MG tablet Take 5 mg by mouth 2 (two) times daily as needed for anxiety.   Yes [provider]  fluticasone (FLONASE) 50 MCG/ACT nasal spray Place 1 spray into both nostrils daily as needed for allergies or rhinitis.   Yes [provider]  gabapentin (NEURONTIN) 100 MG capsule TAKE 1 CAPSULE (100 MG TOTAL) BY MOUTH AT BEDTIME. WHEN NECESSARY FOR NEUROPATHY PAIN Patient taking differently: Take 100 mg by mouth at bedtime.  01/11/18  Yes Newt Minion, MD  hydrocerin (EUCERIN) CREA Apply 371 application topically 2 (two) times daily. Patient taking differently: Apply 1 application topically 2 (two) times daily.  09/16/17  Yes Love, Ivan Anchors, PA-C  insulin NPH Human (HUMULIN N,NOVOLIN N) 100  UNIT/ML injection Inject 0.1 mLs (10 Units total) into the skin at bedtime. 01/23/18  Yes Allie Bossier, MD  insulin regular (NOVOLIN R,HUMULIN R) 100 units/mL injection Inject 3-8 Units into the skin 3 (three) times daily before meals. Per sliding scale on non-dialysis days   Yes [provider]  isosorbide-hydrALAZINE (BIDIL) 20-37.5 MG tablet Take 1 tablet by mouth 3 (three) times daily.    Yes [provider]  lidocaine-prilocaine (EMLA) cream Apply 1 application topically 3 (three) times a week. 1-2 hours prior to dialysis 01/07/18  Yes [provider]  loratadine (CLARITIN) 10 MG tablet Take 10 mg by mouth daily as needed for allergies.   Yes [provider]  multivitamin (RENA-VIT) TABS tablet Take 1 tablet by mouth daily.  01/24/15  Yes [provider]  omeprazole (PRILOSEC) 20 MG capsule Take 20 mg by mouth daily.    Yes [provider]  oxyCODONE-acetaminophen (PERCOCET/ROXICET) 5-325 MG tablet Take 1 tablet by mouth every 4 (four) hours as needed for severe pain. 06/07/18  Yes Rayburn, Neta Mends, PA-C  polyethylene glycol (MIRALAX / GLYCOLAX) packet Take 17 g by mouth daily. Patient taking differently: Take 17 g by mouth daily as needed for moderate constipation.  09/17/17  Yes Love, Ivan Anchors, PA-C  lidocaine, PF, (XYLOCAINE) 1 % SOLN injection Inject 5 mLs into the skin as needed (topical anesthesia for hemodialysis ifGEBAUERS is ineffective.). 01/23/18   Allie Bossier, MD  senna (SENOKOT) 8.6 MG tablet Take 1 tablet by mouth as needed for constipation.    [provider]    Family History Family History  Problem Relation Age of Onset  . Other Mother        not sure of cause of death  . Diabetes Father   . Pancreatic cancer Maternal Grandmother   . Colon cancer Neg Hx     Social History Social History   Tobacco Use  . Smoking status: Former Smoker    Packs/day: 0.35    Years: 40.00    Pack years: 14.00  Types: Cigarettes    Last attempt to quit: 05/10/2012    Years since quitting: 6.1  . Smokeless tobacco: Never Used  Substance Use Topics  . Alcohol use: No  . Drug use: No    Comment: marijuana; quit in early 1980's     Allergies   Prednisone and Lisinopril   Review of Systems Review of Systems  Constitutional: Negative for chills and fever.  Respiratory: Positive for shortness of breath.   Cardiovascular: Negative for chest pain and leg swelling.  Gastrointestinal: Negative for abdominal pain, nausea and vomiting.  Musculoskeletal: Positive for arthralgias.  All other systems reviewed and are negative.    Physical Exam Updated Vital Signs BP (!) 184/83   Pulse 81   Temp 97.6 F (36.4 C) (Oral)   Resp (!) 28   Ht 5\' 5"  (1.651 m)   Wt 62 kg   SpO2 100%   BMI 22.75 kg/m   Physical Exam  Constitutional: She appears well-developed and well-nourished. No distress.  HENT:  Head: Normocephalic and atraumatic.  Eyes: Conjunctivae are normal. Right eye exhibits no discharge. Left eye exhibits no discharge.  Neck: No JVD present. No tracheal deviation present.  Cardiovascular: Normal rate and regular rhythm.  AV fistula in the left upper extremity with palpable thrill.  1+ peripheral pulses bilaterally.  Pulmonary/Chest: Effort normal. Tachypnea noted. She has rales in the right lower field and the left lower field. She exhibits no tenderness.  Coarse breath sounds, bibasilar crackles noted.  Mildly tachypneic.  Abdominal: She exhibits no distension.  Musculoskeletal: She exhibits no edema.  Right knee with mild swelling, sutures in place.  No erythema, warmth, or induration.  Good passive range of motion.  Status post toe amputations bilaterally.  Neurological: She is alert.  Skin: Skin is warm and dry. No erythema.  Psychiatric: She has a normal mood and affect. Her behavior is normal.  Nursing note and vitals reviewed.    ED Treatments / Results  Labs (all labs  ordered are listed, but only abnormal results are displayed) Labs Reviewed  CBC WITH DIFFERENTIAL/PLATELET - Abnormal; Notable for the following components:      Result Value   RBC 3.56 (*)    Hemoglobin 10.4 (*)    HCT 35.8 (*)    MCV 100.6 (*)    MCHC 29.1 (*)    nRBC 0.3 (*)    All other components within normal limits  BASIC METABOLIC PANEL - Abnormal; Notable for the following components:   Chloride 96 (*)    Creatinine, Ser 3.55 (*)    Calcium 8.8 (*)    GFR calc non Af Amer 12 (*)    GFR calc Af Amer 14 (*)    All other components within normal limits  BRAIN NATRIURETIC PEPTIDE - Abnormal; Notable for the following components:   B Natriuretic Peptide 2,980.8 (*)    All other components within normal limits    EKG EKG Interpretation  Date/Time:  Saturday June 18 2018 11:40:03 EST Ventricular Rate:  88 PR Interval:    QRS Duration: 117 QT Interval:  395 QTC Calculation: 478 R Axis:   29 Text Interpretation:  Sinus rhythm Nonspecific intraventricular conduction delay Borderline ST elevation, anterior leads Confirmed by Virgel Manifold [phone]) on 06/18/2018 1:23:17 PM   Radiology Dg Chest 2 View  Result Date: 06/18/2018 CLINICAL DATA:  Shortness of breath this morning. History of asthma, diabetes, hypertension and congestive heart failure. EXAM: CHEST - 2 VIEW COMPARISON:  02/21/2018 and 01/21/2018.  FINDINGS: Stable cardiomegaly and diffuse aortic atherosclerosis. Interval increased interstitial and fissural thickening consistent with worsening pulmonary edema. Small bilateral pleural effusions. No acute osseous findings. Telemetry leads overlie the chest. IMPRESSION: Worsening pulmonary edema and small pleural effusions compared with previous study of 4 months ago, consistent with congestive heart failure. Electronically Signed   By: Richardean Sale M.D.   On: 06/18/2018 12:38    Procedures Procedures (including critical care time)  Medications Ordered in  ED Medications  iopamidol (ISOVUE-370) 76 % injection (has no administration in time range)     Initial Impression / Assessment and Plan / ED Course  I have reviewed the triage vital signs and the nursing notes.  Pertinent labs & imaging results that were available during my care of the patient were reviewed by me and considered in my medical decision making (see chart for details).    Patient presents for evaluation of shortness of breath since this morning.  She is afebrile, hypertensive and tachypneic in the ED.  She has not had her blood pressure medicines this morning.  She is chronically ill and somewhat frail in appearance but nontoxic.  No hypoxia while in the ED but is requiring 2 L nasal cannula for comfort.  Recent arthroscopy with Dr. Sharol Given last week.  Will obtain lab work, chest x-ray, EKG for further evaluation.  Will obtain CTA chest to rule out PE and a high risk patient.  No chest pain to suggest ACS/MI.  EKG shows normal sinus rhythm, no ischemic changes.  Lab work reviewed by me shows stable anemia, elevated creatinine but no metabolic derangements.  BNP elevated at almost 3000.  Chest x-ray consistent with worsening pulmonary edema and small pleural effusions suggestive of CHF.  4:06 PM Spoke with Dr Jonnie Finner with nephrology who will see the patient in the ER emergently.  4:50 PM Spoke with Dr. Kyung Bacca with [facility] hospitalist service who agrees to assume care of patient and bring her into the hospital for further evaluation and management.  Awaiting CTA of the chest.  Discussed with Dr. Wilson Singer who agrees with assessment and plan at this time.  Final Clinical Impressions(s) / ED Diagnoses   Final diagnoses:  Acute on chronic congestive heart failure, unspecified heart failure type [facility])    ED Discharge Orders    None       Debroah Baller 06/18/18 1655    Virgel Manifold, MD 06/20/18 1302

## 2018-06-18 NOTE — ED Triage Notes (Addendum)
Pt BIB [facility] for eval of SOB onset this AM. Pt is a M/W/F HD pt, has not missed any recent treatments. Started w/ SOB this morning, received 1 duoneb by [facility] for reported wheezing, [facility] admin 2 NTG SL. Pressure decreased from 834 systolic -->621V. Pt arrives on 12L NRB. Non productive cough. Per pt, last 2 dialysis appts she has been under her normal weight, so they have not taken as much off during treatments.

## 2018-06-19 DIAGNOSIS — E1122 Type 2 diabetes mellitus with diabetic chronic kidney disease: Secondary | ICD-10-CM | POA: Diagnosis present

## 2018-06-19 DIAGNOSIS — J431 Panlobular emphysema: Secondary | ICD-10-CM | POA: Diagnosis not present

## 2018-06-19 DIAGNOSIS — K219 Gastro-esophageal reflux disease without esophagitis: Secondary | ICD-10-CM | POA: Diagnosis present

## 2018-06-19 DIAGNOSIS — I132 Hypertensive heart and chronic kidney disease with heart failure and with stage 5 chronic kidney disease, or end stage renal disease: Secondary | ICD-10-CM | POA: Diagnosis present

## 2018-06-19 DIAGNOSIS — E11649 Type 2 diabetes mellitus with hypoglycemia without coma: Secondary | ICD-10-CM | POA: Diagnosis present

## 2018-06-19 DIAGNOSIS — J449 Chronic obstructive pulmonary disease, unspecified: Secondary | ICD-10-CM | POA: Diagnosis present

## 2018-06-19 DIAGNOSIS — E1142 Type 2 diabetes mellitus with diabetic polyneuropathy: Secondary | ICD-10-CM | POA: Diagnosis present

## 2018-06-19 DIAGNOSIS — E1151 Type 2 diabetes mellitus with diabetic peripheral angiopathy without gangrene: Secondary | ICD-10-CM | POA: Diagnosis present

## 2018-06-19 DIAGNOSIS — N2581 Secondary hyperparathyroidism of renal origin: Secondary | ICD-10-CM | POA: Diagnosis not present

## 2018-06-19 DIAGNOSIS — D631 Anemia in chronic kidney disease: Secondary | ICD-10-CM | POA: Diagnosis not present

## 2018-06-19 DIAGNOSIS — Z89432 Acquired absence of left foot: Secondary | ICD-10-CM | POA: Diagnosis not present

## 2018-06-19 DIAGNOSIS — Z79899 Other long term (current) drug therapy: Secondary | ICD-10-CM | POA: Diagnosis not present

## 2018-06-19 DIAGNOSIS — M810 Age-related osteoporosis without current pathological fracture: Secondary | ICD-10-CM | POA: Diagnosis present

## 2018-06-19 DIAGNOSIS — Z87891 Personal history of nicotine dependence: Secondary | ICD-10-CM | POA: Diagnosis not present

## 2018-06-19 DIAGNOSIS — I120 Hypertensive chronic kidney disease with stage 5 chronic kidney disease or end stage renal disease: Secondary | ICD-10-CM | POA: Diagnosis not present

## 2018-06-19 DIAGNOSIS — N186 End stage renal disease: Secondary | ICD-10-CM

## 2018-06-19 DIAGNOSIS — J810 Acute pulmonary edema: Secondary | ICD-10-CM | POA: Diagnosis not present

## 2018-06-19 DIAGNOSIS — Z992 Dependence on renal dialysis: Secondary | ICD-10-CM

## 2018-06-19 DIAGNOSIS — E8889 Other specified metabolic disorders: Secondary | ICD-10-CM | POA: Diagnosis present

## 2018-06-19 DIAGNOSIS — I5033 Acute on chronic diastolic (congestive) heart failure: Secondary | ICD-10-CM

## 2018-06-19 DIAGNOSIS — Z7951 Long term (current) use of inhaled steroids: Secondary | ICD-10-CM | POA: Diagnosis not present

## 2018-06-19 DIAGNOSIS — J9601 Acute respiratory failure with hypoxia: Secondary | ICD-10-CM | POA: Diagnosis not present

## 2018-06-19 DIAGNOSIS — G4730 Sleep apnea, unspecified: Secondary | ICD-10-CM | POA: Diagnosis present

## 2018-06-19 DIAGNOSIS — Z888 Allergy status to other drugs, medicaments and biological substances status: Secondary | ICD-10-CM | POA: Diagnosis not present

## 2018-06-19 DIAGNOSIS — Z89431 Acquired absence of right foot: Secondary | ICD-10-CM | POA: Diagnosis not present

## 2018-06-19 DIAGNOSIS — F411 Generalized anxiety disorder: Secondary | ICD-10-CM | POA: Diagnosis present

## 2018-06-19 DIAGNOSIS — E1100 Type 2 diabetes mellitus with hyperosmolarity without nonketotic hyperglycemic-hyperosmolar coma (NKHHC): Secondary | ICD-10-CM | POA: Diagnosis not present

## 2018-06-19 DIAGNOSIS — I10 Essential (primary) hypertension: Secondary | ICD-10-CM | POA: Diagnosis not present

## 2018-06-19 DIAGNOSIS — E785 Hyperlipidemia, unspecified: Secondary | ICD-10-CM | POA: Diagnosis present

## 2018-06-19 LAB — GLUCOSE, CAPILLARY
Glucose-Capillary: 64 mg/dL — ABNORMAL LOW (ref 70–99)
Glucose-Capillary: 107 mg/dL — ABNORMAL HIGH (ref 70–99)
Glucose-Capillary: 119 mg/dL — ABNORMAL HIGH (ref 70–99)
Glucose-Capillary: 93 mg/dL (ref 70–99)

## 2018-06-19 LAB — RENAL FUNCTION PANEL
Albumin: 2.2 g/dL — ABNORMAL LOW (ref 3.5–5.0)
Anion gap: 8 (ref 5–15)
BUN: 11 mg/dL (ref 8–23)
CO2: 28 mmol/L (ref 22–32)
Calcium: 8.2 mg/dL — ABNORMAL LOW (ref 8.9–10.3)
Chloride: 96 mmol/L — ABNORMAL LOW (ref 98–111)
Creatinine, Ser: 3.49 mg/dL — ABNORMAL HIGH (ref 0.44–1.00)
GFR calc Af Amer: 15 mL/min — ABNORMAL LOW (ref >60)
GFR calc non Af Amer: 13 mL/min — ABNORMAL LOW (ref >60)
Glucose, Bld: 69 mg/dL — ABNORMAL LOW (ref 70–99)
Phosphorus: 2.8 mg/dL (ref 2.5–4.6)
Potassium: 3.3 mmol/L — ABNORMAL LOW (ref 3.5–5.1)
Sodium: 132 mmol/L — ABNORMAL LOW (ref 135–145)

## 2018-06-19 LAB — CBC
HCT: 30 % — ABNORMAL LOW (ref 36.0–46.0)
Hemoglobin: 8.9 g/dL — ABNORMAL LOW (ref 12.0–15.0)
MCH: 29.8 pg (ref 26.0–34.0)
MCHC: 29.7 g/dL — ABNORMAL LOW (ref 30.0–36.0)
MCV: 100.3 fL — ABNORMAL HIGH (ref 80.0–100.0)
Platelets: 251 10*3/uL (ref 150–400)
RBC: 2.99 MIL/uL — ABNORMAL LOW (ref 3.87–5.11)
RDW: 15.1 % (ref 11.5–15.5)
WBC: 6.2 10*3/uL (ref 4.0–10.5)
nRBC: 0.3 % — ABNORMAL HIGH (ref 0.0–0.2)

## 2018-06-19 LAB — MRSA PCR SCREENING: MRSA by PCR: NEGATIVE

## 2018-06-19 MED ORDER — HEPARIN SODIUM (PORCINE) 1000 UNIT/ML DIALYSIS
20.0000 [IU]/kg | INTRAMUSCULAR | Status: DC | PRN
  Filled 2018-06-19: qty 2

## 2018-06-19 MED ORDER — LORATADINE 10 MG PO TABS
10.0000 mg | ORAL_TABLET | Freq: Every day | ORAL | Status: DC
  Administered 2018-06-20: 10 mg via ORAL
  Filled 2018-06-19: qty 1

## 2018-06-19 MED ORDER — FLUTICASONE PROPIONATE 50 MCG/ACT NA SUSP
1.0000 | Freq: Every day | NASAL | Status: DC
  Administered 2018-06-20: 1 via NASAL
  Filled 2018-06-19: qty 16

## 2018-06-19 NOTE — Progress Notes (Signed)
   06/18/18 2031  Vitals  Temp 98.4 F (36.9 C)  Temp Source Oral  BP (!) 207/145  MAP (mmHg) 158  BP Location Right Arm  BP Method Automatic  Patient Position (if appropriate) Lying  Pulse Rate 96  Resp 18  Oxygen Therapy  SpO2 100 %  O2 Device Room Air  Pain Assessment  Pain Scale 0-10  Pain Score 0  Pt in settled in the room and on telemetry.  Verified with RN and NT with CCMD.  BP elevated compared to given BP from ED report.  Paged [facility] and got order for IV labetalol, IV labetalol given and BP improved.

## 2018-06-19 NOTE — Progress Notes (Addendum)
Hypoglycemic Event  CBG: 61  Treatment: 15 GM carbohydrate snack (First meal of day, pt 1.5 hr s/p HD)  Symptoms: None  Follow-up CBG: Time:1340 CBG Result:64 (pt denies symptoms, but was delayed in food consumption due to dietary tray complications)  Will recheck in another 30 minutes.    Possible Reasons for Event: Inadequate meal intake and Other: HD   Comments/MD notified:  Page to Dr Eliseo Squires. 3e06 [place]: Pt back from HD now, CBG of 61. Asymptomatic, Eating first meal of the day currently.    Maud Deed Tobias-Diakun,RN    1500: CBG recheck 107 after eating her meal. Pt still denies complaints/symptoms.

## 2018-06-19 NOTE — Progress Notes (Signed)
sensipar tubed to HD.

## 2018-06-19 NOTE — Progress Notes (Signed)
Patient is in dialysis at shift change.

## 2018-06-19 NOTE — Progress Notes (Signed)
[facility] Progress Note  Subjective: no c/o, calm and no SOB  Vitals:   06/19/18 0109 06/19/18 0127 06/19/18 0510 06/19/18 0724  BP: (!) 175/75 (!) 167/72 (!) 157/65 (!) (P) 182/70  Pulse: 74 74 75 (P) 70  Resp:   18 (P) 16  Temp:   98.3 F (36.8 C) (P) 98.2 F (36.8 C)  TempSrc:   Oral (P) Oral  SpO2:   100% (P) 100%  Weight:   66.9 kg   Height:        Inpatient medications: . aspirin EC  81 mg Oral Daily  . atorvastatin  20 mg Oral QHS  . carvedilol  12.5 mg Oral BID WC  . cinacalcet  60 mg Oral Q Sun-HD  . [START ON 06/20/2018] cinacalcet  60 mg Oral Q M,W,F-HD  . clopidogrel  75 mg Oral Daily  . gabapentin  100 mg Oral QHS  . heparin  5,000 Units Subcutaneous Q8H  . hydrocerin  1 application Topical BID  . insulin aspart  0-9 Units Subcutaneous TID WC  . insulin NPH Human  10 Units Subcutaneous QHS  . isosorbide-hydrALAZINE  1 tablet Oral TID  . mometasone-formoterol  2 puff Inhalation BID  . multivitamin  1 tablet Oral Daily  . pantoprazole  40 mg Oral Daily  . sodium chloride flush  3 mL Intravenous Q12H   . sodium chloride     sodium chloride, albuterol, diazepam, fluticasone, heparin, hydrALAZINE, labetalol, lidocaine (PF), loratadine, oxyCODONE-acetaminophen, polyethylene glycol, senna, sodium chloride flush  Iron/TIBC/Ferritin/ %Sat    Component Value Date/Time   IRON 31 08/28/2017 0414   TIBC 140 (L) 08/28/2017 0414   FERRITIN 1,854 (H) 08/28/2017 0414   IRONPCTSAT 22 08/28/2017 0414    Exam: General: Elderly female NAD on nasal O2   Head: NCAT sclera not icteric MMM Neck: Supple. No JVD appreciated  Lungs: Diminished, faint crackles at R base  Heart: RRR with S1 S2 Abdomen: soft NT + BS Lower extremities:without edema or ischemic changes, no open wounds  Neuro: A & O  X 3. Moves all extremities spontaneously. Psych:  Responds to questions appropriately with a normal affect. Dialysis Access: LUE AVG +bruit   Dialysis: [place]  MWF  62kg   400/800  3K/2.25 bath  P4  Hep 2500   L AVG  - mircera 150 q 2, last 11/18      Impression/ Plan: 1. Dyspnea - CXR showing worsening pulmonary edema/CHF. HD this am for volume removal (did not get HD last night). Pt stable on nasal O2. Is at dry wt and will need lowering.  2. ESRD -  MWF. HD today.  3. Hypertension/volume  - BP's up, cont bidil/ coreg.  4. Anemia ckd - Hgb stable. 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 Continue Auryxia binder/Sensipar     Kelly Splinter MD [employer] pager [phone]   06/19/2018, 7:48 AM   Recent Labs  Lab 06/18/18 1419  NA 135  K 3.9  CL 96*  CO2 29  GLUCOSE 93  BUN 10  CREATININE 3.55*  CALCIUM 8.8*   No results for input(s): AST, ALT, ALKPHOS, BILITOT, PROT in the last 168 hours. Recent Labs  Lab 06/18/18 1419  WBC 6.3  NEUTROABS 3.8  HGB 10.4*  HCT 35.8*  MCV 100.6*  PLT 262

## 2018-06-19 NOTE — Progress Notes (Signed)
Attempted to do standing scale weight for patient but patient unable to do so at this time.  Removed all excess blankets on bed to weigh.

## 2018-06-19 NOTE — Progress Notes (Signed)
SATURATION QUALIFICATIONS: (This note is used to comply with regulatory documentation for home oxygen)  Patient Saturations on Room Air at Rest = 94%  Patient Saturations on Room Air while Ambulating = 90%  Patient Saturations on x Liters of oxygen while Ambulating = N/A

## 2018-06-19 NOTE — Progress Notes (Signed)
Patient has returned to the floor from dialysis, CCMD notified.

## 2018-06-19 NOTE — Progress Notes (Signed)
Pt going to dialysis.  CCMD called to inform of location change.

## 2018-06-19 NOTE — Plan of Care (Signed)
  Problem: Clinical Measurements: Goal: Respiratory complications will improve Outcome: Progressing   Problem: Coping: Goal: Level of anxiety will decrease Outcome: Progressing   Problem: Pain Managment: Goal: General experience of comfort will improve Outcome: Progressing   Problem: Safety: Goal: Ability to remain free from injury will improve Outcome: Progressing   

## 2018-06-19 NOTE — Progress Notes (Addendum)
[facility] PROGRESS NOTE  SHAUNTIA LEVENGOOD DQQ:[id number] DOB: [birth year] DOA: 06/18/2018 PCP: Velna Hatchet, MD  Assessment/Plan:  Principal Problem:   Acute respiratory failure with hypoxia ([place]) secondary to acute on chronic diastolic heart failure in setting of ESRD on dialysis. Oxygen saturation level 87% on room air upon presentation. BNP elevated. Chest xray with Worsening pulmonary edema and small pleural effusions, CT Moderate cardiomegaly with interstitial pulmonary edema and faint perihilar airspace edema. Small bilateral pleural effusions with passive atelectasis. Provided with nebs, oxygen supplementation. Evaluated by nephrology who opines that last dialysis session patient at dry weight and may need EDW adjustment. Continues with increased oxygen demand today. Dialyzed today -? If will need dialysis tomorrow -continue oxygen supplementation -wean oxygen as able- home O2 study  Active Problems:    Acute on chronic diastolic heart failure ([place]). ehco in June of this year reveals EF 55%, severe LVH, grade 2 diastolic dysfunction. Is compliant with dialysis. Home meds include coreg -continue home meds -daily weights -monitor intake and output  ESRD on dialysis ([place]). Patient is mwf dialysis patient. Reportedly compliant with dialysis. Evaluated by nephrology who indicate dry weight will be lowered.  -may need dialysis tomorrow -continue home meds -appreciate nephrology assistance    Chronic obstructive pulmonary disease ([place]): see #1. Chest xray and CT chest as noted above. No wheezes on exam -continue home med -nebs prn    Type 2 diabetes mellitus with hyperosmolar nonketotic hyperglycemia ([facility]) serum glucose 69 this am. Likely related to  #3. HgA1c last month 7.2. cbg low this am as had not eaten. Ate 100% of lunch -continue home regimen -monitor      Anemia of chronic disease: stable at baseline -continue home meds    Generalized anxiety disorder: stable  at baseline    Essential hypertension; fair control -dialysis as noted above -continue home med    HLD (hyperlipidemia) -continue home med    Allergic rhinitis due to pollen -continue claritin/flonase    Code Status: full Family Communication: husband at bedside per Dr Eliseo Squires Disposition Plan: home hopefully tomorrow   Consultants:  Dr Jonnie Finner nephrology  Procedures:  Dialysis 06/19/18  Antibiotics:    HPI/Subjective: Lying in bed with eyes closed. Awakens to verbal stimuli. Reports breathing easier. Denies pain/discomfort  Presented to ED on 11/23 with sudden onset of SOB. Began night night prior at home. Uses inhalers, but reportedly couldn't find them. Treated with duoneb, nitro per EMS. CXR showing worsening pulm edema/CHF. CT angio  r/o PE. BNP elevated 2980. Electrolytes stable  Objective: Vitals:   06/19/18 1132 06/19/18 1300  BP: (!) 174/68 (!) 157/68  Pulse: 80 84  Resp: 16   Temp: 98.6 F (37 C) 98.4 F (36.9 C)  SpO2: 99% 91%    Intake/Output Summary (Last 24 hours) at 06/19/2018 1419 Last data filed at 06/19/2018 1305 Gross per 24 hour  Intake 700 ml  Output 3550 ml  Net -2850 ml   Filed Weights   06/18/18 1139 06/19/18 0510 06/19/18 1132  Weight: 62 kg 66.9 kg 63.4 kg    Exam:   General:  Lying in bed eyes closed arouses to verbal stimuli no acute distress  Cardiovascular: rrr no mgr trace LE edema  Respiratory: normal effort BS with crackles bilateral bases no wheeze  Abdomen: slightly distended but soft +BS no guarding or rebounding  Musculoskeletal: joints without swelling/erythema   Data Reviewed: Basic Metabolic Panel: Recent Labs  Lab 06/18/18 1419 06/19/18 0730  NA 135 132*  K  3.9 3.3*  CL 96* 96*  CO2 29 28  GLUCOSE 93 69*  BUN 10 11  CREATININE 3.55* 3.49*  CALCIUM 8.8* 8.2*  PHOS  --  2.8   Liver Function Tests: Recent Labs  Lab 06/19/18 0730  ALBUMIN 2.2*   No results for input(s): LIPASE, AMYLASE in  the last 168 hours. No results for input(s): AMMONIA in the last 168 hours. CBC: Recent Labs  Lab 06/18/18 1419 06/19/18 0730  WBC 6.3 6.2  NEUTROABS 3.8  --   HGB 10.4* 8.9*  HCT 35.8* 30.0*  MCV 100.6* 100.3*  PLT 262 251   Cardiac Enzymes: No results for input(s): CKTOTAL, CKMB, CKMBINDEX, TROPONINI in the last 168 hours. BNP (last 3 results) Recent Labs    06/18/18 1420  BNP 2,980.8*    ProBNP (last 3 results) No results for input(s): PROBNP in the last 8760 hours.  CBG: Recent Labs  Lab 06/18/18 2157 06/19/18 1357  GLUCAP 134* 64*    Recent Results (from the past 240 hour(s))  MRSA PCR Screening     Status: None   Collection Time: 06/18/18 11:58 PM  Result Value Ref Range Status   MRSA by PCR NEGATIVE NEGATIVE Final    Comment:        The GeneXpert MRSA Assay (FDA approved for NASAL specimens only), is one component of a comprehensive MRSA colonization surveillance program. It is not intended to diagnose MRSA infection nor to guide or monitor treatment for MRSA infections.      Studies: Dg Chest 2 View  Result Date: 06/18/2018 CLINICAL DATA:  Shortness of breath this morning. History of asthma, diabetes, hypertension and congestive heart failure. EXAM: CHEST - 2 VIEW COMPARISON:  02/21/2018 and 01/21/2018. FINDINGS: Stable cardiomegaly and diffuse aortic atherosclerosis. Interval increased interstitial and fissural thickening consistent with worsening pulmonary edema. Small bilateral pleural effusions. No acute osseous findings. Telemetry leads overlie the chest. IMPRESSION: Worsening pulmonary edema and small pleural effusions compared with previous study of 4 months ago, consistent with congestive heart failure. Electronically Signed   By: Richardean Sale M.D.   On: 06/18/2018 12:38   Ct Angio Chest Pe W And/or Wo Contrast  Result Date: 06/18/2018 CLINICAL DATA:  Shortness of breath starting this morning. End-stage renal disease. EXAM: CT  ANGIOGRAPHY CHEST WITH CONTRAST TECHNIQUE: Multidetector CT imaging of the chest was performed using the standard protocol during bolus administration of intravenous contrast. Multiplanar CT image reconstructions and MIPs were obtained to evaluate the vascular anatomy. CONTRAST:  58mL ISOVUE-370 IOPAMIDOL (ISOVUE-370) INJECTION 76% COMPARISON:  Chest radiograph 06/18/2018 FINDINGS: Cardiovascular: No filling defect is identified in the pulmonary arterial tree to suggest pulmonary embolus. Systemic arterial vascular opacification is only minimal but no acute aortic findings are observed. Dense atherosclerotic calcification of the branch vessels, thoracic aorta, and coronary arteries. Moderate cardiomegaly. Interventricular septal thickness 2.3 cm compatible with left ventricular hypertrophy. Mediastinum/Nodes: Mildly dilated esophagus with air-fluid level. Enlarged AP window lymph node 1.6 cm in short axis on image 47/8. Right hilar node 1.3 cm in short axis on image 61/5. Additional prominence of hilar and infrahilar nodal tissue. Indistinct subcarinal node approximately 1.5 cm in short axis on image 69/5. Lungs/Pleura: Small bilateral pleural effusions with associated passive atelectasis. Secondary pulmonary lobular septal thickening and accentuation especially at the lung apices for example on image 23/6, a characteristic finding for pulmonary edema. Minimal perihilar airspace edema. Airway thickening is present, suggesting bronchitis or reactive airways disease. Upper Abdomen: Unremarkable Musculoskeletal: Thoracic spondylosis. Review of  the MIP images confirms the above findings. IMPRESSION: 1. No filling defect is identified in the pulmonary arterial tree to suggest pulmonary embolus. 2. Moderate cardiomegaly with interstitial pulmonary edema and faint perihilar airspace edema. Small bilateral pleural effusions with passive atelectasis. 3. There is abnormal adenopathy in the chest, for example an AP window  lymph node measuring 1.6 cm in diameter. These lymph nodes are somewhat indistinctly marginated, and I would tend to favor passive congestion is the likely cause. Strictly speaking, malignancy such as lymphoma is not excluded. 4. Airway thickening is present, suggesting bronchitis or reactive airways disease. 5. Thoracic spondylosis. 6.  Aortic Atherosclerosis (ICD10-I70.0).  Coronary atherosclerosis. Electronically Signed   By: Van Clines M.D.   On: 06/18/2018 17:56    Scheduled Meds: . aspirin EC  81 mg Oral Daily  . atorvastatin  20 mg Oral QHS  . carvedilol  12.5 mg Oral BID WC  . [START ON 06/20/2018] cinacalcet  60 mg Oral Q M,W,F-HD  . clopidogrel  75 mg Oral Daily  . fluticasone  1 spray Each Nare Daily  . gabapentin  100 mg Oral QHS  . heparin  5,000 Units Subcutaneous Q8H  . hydrocerin  1 application Topical BID  . insulin aspart  0-9 Units Subcutaneous TID WC  . insulin NPH Human  10 Units Subcutaneous QHS  . isosorbide-hydrALAZINE  1 tablet Oral TID  . loratadine  10 mg Oral Daily  . mometasone-formoterol  2 puff Inhalation BID  . multivitamin  1 tablet Oral Daily  . pantoprazole  40 mg Oral Daily  . sodium chloride flush  3 mL Intravenous Q12H   Continuous Infusions: . sodium chloride        Time spent: 74 minutes    [place] NP [facility]  If 7PM-7AM, please contact night-coverage at [email or web address], password [facility] 06/19/2018, 2:19 PM  LOS: 0 days      Patient was seen, examined,treatment plan was discussed with the Advance Practice Provider.  I have personally reviewed the clinical findings, labs, EKG, imaging studies and management of this patient in detail. I have also reviewed the orders written for this patient which were under my direction. I agree with the documentation, as recorded by the Advance Practice Provider.   CINDRA AUSTAD is a 67 y.o. female  Here with acute respiratory failure due to volume overload.  Compliant with HD,  needs lower dry weight.  Before d/c need to be able to wean off O2-- ambulatory O2 sats pending (due to holiday schedule, next HD is Tuesday). Restart allergy medications-- congested.  Geradine Girt, DO

## 2018-06-20 DIAGNOSIS — E1100 Type 2 diabetes mellitus with hyperosmolarity without nonketotic hyperglycemic-hyperosmolar coma (NKHHC): Secondary | ICD-10-CM

## 2018-06-20 DIAGNOSIS — J810 Acute pulmonary edema: Secondary | ICD-10-CM

## 2018-06-20 DIAGNOSIS — I10 Essential (primary) hypertension: Secondary | ICD-10-CM

## 2018-06-20 DIAGNOSIS — J431 Panlobular emphysema: Secondary | ICD-10-CM

## 2018-06-20 LAB — GLUCOSE, CAPILLARY
Glucose-Capillary: 61 mg/dL — ABNORMAL LOW (ref 70–99)
Glucose-Capillary: 35 mg/dL — CL (ref 70–99)
Glucose-Capillary: 36 mg/dL — CL (ref 70–99)
Glucose-Capillary: 56 mg/dL — ABNORMAL LOW (ref 70–99)
Glucose-Capillary: 58 mg/dL — ABNORMAL LOW (ref 70–99)
Glucose-Capillary: 90 mg/dL (ref 70–99)
Glucose-Capillary: 79 mg/dL (ref 70–99)
Glucose-Capillary: 84 mg/dL (ref 70–99)
Glucose-Capillary: 88 mg/dL (ref 70–99)
Glucose-Capillary: 88 mg/dL (ref 70–99)
Glucose-Capillary: 108 mg/dL — ABNORMAL HIGH (ref 70–99)
Glucose-Capillary: 99 mg/dL (ref 70–99)

## 2018-06-20 LAB — CBC
HCT: 34.4 % — ABNORMAL LOW (ref 36.0–46.0)
Hemoglobin: 9.9 g/dL — ABNORMAL LOW (ref 12.0–15.0)
MCH: 28.8 pg (ref 26.0–34.0)
MCHC: 28.8 g/dL — ABNORMAL LOW (ref 30.0–36.0)
MCV: 100 fL (ref 80.0–100.0)
Platelets: 253 10*3/uL (ref 150–400)
RBC: 3.44 MIL/uL — ABNORMAL LOW (ref 3.87–5.11)
RDW: 15.1 % (ref 11.5–15.5)
WBC: 6.7 10*3/uL (ref 4.0–10.5)
nRBC: 0.3 % — ABNORMAL HIGH (ref 0.0–0.2)

## 2018-06-20 LAB — BASIC METABOLIC PANEL
Anion gap: 9 (ref 5–15)
BUN: 6 mg/dL — ABNORMAL LOW (ref 8–23)
CO2: 25 mmol/L (ref 22–32)
Calcium: 8.1 mg/dL — ABNORMAL LOW (ref 8.9–10.3)
Chloride: 99 mmol/L (ref 98–111)
Creatinine, Ser: 3.3 mg/dL — ABNORMAL HIGH (ref 0.44–1.00)
GFR calc Af Amer: 16 mL/min — ABNORMAL LOW (ref >60)
GFR calc non Af Amer: 13 mL/min — ABNORMAL LOW (ref >60)
Glucose, Bld: 80 mg/dL (ref 70–99)
Potassium: 4.4 mmol/L (ref 3.5–5.1)
Sodium: 133 mmol/L — ABNORMAL LOW (ref 135–145)

## 2018-06-20 MED ORDER — CINACALCET HCL 30 MG PO TABS
60.0000 mg | ORAL_TABLET | ORAL | Status: DC
  Administered 2018-06-20: 60 mg via ORAL
  Filled 2018-06-20: qty 2

## 2018-06-20 MED ORDER — DEXTROSE 50 % IV SOLN: 25.0000 mL | Freq: Once | INTRAVENOUS | Status: AC

## 2018-06-20 MED ORDER — DEXTROSE 50 % IV SOLN
INTRAVENOUS | Status: AC
  Filled 2018-06-20: qty 50

## 2018-06-20 MED ORDER — CHLORHEXIDINE GLUCONATE CLOTH 2 % EX PADS: 6.0000 | MEDICATED_PAD | Freq: Every day | CUTANEOUS | Status: DC

## 2018-06-20 MED ORDER — HEPARIN SODIUM (PORCINE) 1000 UNIT/ML DIALYSIS
2500.0000 [IU] | Freq: Once | INTRAMUSCULAR | Status: AC
  Administered 2018-06-20: 2500 [IU] via INTRAVENOUS_CENTRAL

## 2018-06-20 MED ORDER — FERRIC CITRATE 1 GM 210 MG(FE) PO TABS
420.0000 mg | ORAL_TABLET | Freq: Three times a day (TID) | ORAL | Status: DC
  Administered 2018-06-20: 420 mg via ORAL
  Filled 2018-06-20 (×2): qty 2

## 2018-06-20 MED ORDER — HEPARIN SODIUM (PORCINE) 1000 UNIT/ML IJ SOLN
INTRAMUSCULAR | Status: AC
  Administered 2018-06-20: 2500 [IU] via INTRAVENOUS_CENTRAL
  Filled 2018-06-20: qty 3

## 2018-06-20 MED ORDER — HYDRALAZINE HCL 20 MG/ML IJ SOLN
INTRAMUSCULAR | Status: AC
  Administered 2018-06-20: 10 mg via INTRAVENOUS
  Filled 2018-06-20: qty 1

## 2018-06-20 MED ORDER — PRO-STAT SUGAR FREE PO LIQD
30.0000 mL | Freq: Two times a day (BID) | ORAL | Status: DC
  Administered 2018-06-20: 30 mL via ORAL
  Filled 2018-06-20: qty 30

## 2018-06-20 NOTE — Consult Note (Signed)
   [facility] Inpatient Consult   06/20/2018  Destiny Day 22-Nov-1950 [id number]  Patient chart reviewed for unplanned readmission risk score of 48% and multiple hospitalizations.   Patient is currently active with [facility] for chronic disease management services.  Patient has been engaged by a [place] Charity fundraiser and Education officer, museum.    Spoke with patient at bedside regarding need for follow up services when she transitions home. Patient states that she welcomes continued assistance of [facility] community case Freight forwarder and Education officer, museum. States that she would appreciate any financial assistance with medications. Patient signed the written consent form. States that her preferred phone number is [phone].  Of note, [facility] Care Management services does not replace or interfere with any services that are needed or arranged by inpatient case management or social work.    Netta Cedars, MSN, [facility] Liaison Nurse Mobile Phone [phone]  Toll free office [phone]

## 2018-06-20 NOTE — Progress Notes (Addendum)
Inpatient Diabetes Program Recommendations  AACE/ADA: New Consensus Statement on Inpatient Glycemic Control (2015)  Target Ranges:  Prepandial:   less than 140 mg/dL      Peak postprandial:   less than 180 mg/dL (1-2 hours)      Critically ill patients:  140 - 180 mg/dL   Lab Results  Component Value Date   GLUCAP 88 06/20/2018   HGBA1C 7.2 (H) 05/06/2018      Results for NOVALIE, LEAMY (MRN [id number]) as of 06/20/2018 09:44  Ref. Range 06/20/2018 02:54 06/20/2018 03:10 06/20/2018 03:25 06/20/2018 03:40 06/20/2018 03:53 06/20/2018 05:04 06/20/2018 06:43 06/20/2018 07:51 06/20/2018 09:36  Glucose-Capillary Latest Ref Range: 70 - 99 mg/dL 35 (LL) 36 (LL) 56 (L) 58 (L) 90 79 84 88 88    DM2  Home DM meds: NPH 10 units hs; Humulin 3-8 units tid ssi on non - dialysis days  Current inpatient DM meds: NPH 10 units hs; Novolog sensitive (0-9 units) tid ac    Note significant hypoglycemic episode this am of 35 mg/dl.    MD please consider the following inpatient diabetes recommendations.   Decrease NPH to 5 units hs starting tonight.     Thank you.  -- Will follow during hospitalization.--  Jonna Clark RN, MSN Diabetes Coordinator Inpatient Glycemic Control Team Team Pager: [phone] (8am-5pm)

## 2018-06-20 NOTE — Discharge Summary (Signed)
Physician Discharge Summary  Destiny Day TZG:[id number] DOB: 12-25-1950 DOA: 06/18/2018  PCP: Velna Hatchet, MD  Admit date: 06/18/2018 Discharge date: 06/20/2018  Admitted From: Home  Disposition:  Home   Recommendations for Outpatient Follow-up and new medication changes:  1. Follow up with Dr. Ardeth Perfect in 7 days.  2. Advised about a low salt and fluid restricted diet.    Home Health: no   Equipment/Devices: no    Discharge Condition: stable  CODE STATUS: full  Diet recommendation: heart healthy and renal prudent   Brief/Interim Summary: 67 year old female who presented with dyspnea.  She does have a past medical history of end-stage renal disease on hemodialysis, type 2 diabetes mellitus, asthma, heart failure, peripheral neuropathy, peripheral vascular disease, status post bilateral forefoot amputation.  Patient did develop acute dyspnea, while at rest, had normal dialysis treatments day before.  On her initial physical examination blood pressure 209/134, heart rate 88, temperature 97.6, respiratory rate 21, oxygen saturation 93%.  No JVD, positive pulmonary bilateral rales, no wheezing, heart S1-S2 present rhythmic, abdomen soft nontender, no lower extremity edema. (AV fistula left upper extremity).  Na 135, potassium 3.9, chloride 96, bicarb 29, glucose 93, BUN 10, creatinine 3.5, white count 6.3, 10.4, hematocrit 35.8, platelets 262.  Chest x-ray with bilateral interstitial markings at bases, cephalization of vasculature and bilateral small pleural effusions, CT chest with bilateral groundglass opacities, no pulmonary embolism.  EKG sinus rhythm, normal axis, normal intervals, poor R wave progression.  Patient was admitted to the hospital with a working diagnosis of volume overload related to end-stage renal disease and acute on chronic diastolic heart failure exacerbation.  1.  Acute pulmonary edema (present on admission) due to volume overload, related to end-stage renal  disease on HD.  Patient was admitted to the medical ward, she was placed on remote telemetry monitoring, placed on supplemental oxygen per nasal cannula and received emergent hemodialysis/ ultrafiltration for 2 consecutive dayswith improvement of her symptoms.  Discharge oximetry 97% on room air.  Continue Sensipar, for metabolic bone disease.  2.  Acute on chronic diastolic heart failure exacerbation.  Likely related  to volume overload secondary to end-stage renal disease.  Her last echocardiogram from June 2019 showed a preserved LV systolic function 55 to 75%, with features of diastolic dysfunction, positive left ventricular hypertrophy.  Continue carvedilol 12.5 mg twice daily.  3.  Type 2 diabetes mellitus with hypoglycemia.  Patient was placed on insulin sliding scale for glucose coverage monitoring.  Her glucose was low, 93, 69, 80.  At home will resume her insulin regimen with NPH and regular insulin.   4.  Hypertension.  Continue blood pressure control with carvedilol and  Isosorbide/hydralazine.  5.  Anemia chronic kidney disease.  Stable, follow-up as an outpatient.  6.  COPD.  Stable with no signs of exacerbation.  Continue Symbicort.   Discharge Diagnoses:  Principal Problem:   Acute respiratory failure with hypoxia ([place]) Active Problems:   HLD (hyperlipidemia)   Allergic rhinitis due to pollen   Type 2 diabetes mellitus with hyperosmolar nonketotic hyperglycemia ([facility])   Acute on chronic diastolic heart failure ([facility])   ESRD on dialysis ([place])   Anemia of chronic disease   Generalized anxiety disorder   Chronic obstructive pulmonary disease ([place])   Essential hypertension    Discharge Instructions   Allergies as of 06/20/2018      Reactions   Prednisone Swelling, Other (See Comments)   Excessive fluid buildup   Lisinopril Cough  Medication List    TAKE these medications   albuterol 108 (90 Base) MCG/ACT inhaler Commonly known as:  PROVENTIL HFA;VENTOLIN  HFA Inhale 1-2 puffs into the lungs every 6 (six) hours as needed for wheezing or shortness of breath.   aspirin EC 81 MG tablet Take 81 mg by mouth daily.   atorvastatin 20 MG tablet Commonly known as:  LIPITOR Take 20 mg by mouth at bedtime.   budesonide-formoterol 160-4.5 MCG/ACT inhaler Commonly known as:  SYMBICORT Inhale 1 puff into the lungs daily as needed (for respiratory issues).   carvedilol 12.5 MG tablet Commonly known as:  COREG Take 12.5 mg by mouth 2 (two) times daily with a meal.   cinacalcet 60 MG tablet Commonly known as:  SENSIPAR Take 60 mg by mouth daily. Pt. Gets at dialysis   clopidogrel 75 MG tablet Commonly known as:  PLAVIX Take 75 mg by mouth daily.   diazepam 5 MG tablet Commonly known as:  VALIUM Take 5 mg by mouth 2 (two) times daily as needed for anxiety.   fluticasone 50 MCG/ACT nasal spray Commonly known as:  FLONASE Place 1 spray into both nostrils daily as needed for allergies or rhinitis.   gabapentin 100 MG capsule Commonly known as:  NEURONTIN TAKE 1 CAPSULE (100 MG TOTAL) BY MOUTH AT BEDTIME. WHEN NECESSARY FOR NEUROPATHY PAIN What changed:  additional instructions   hydrocerin Crea Apply 628 application topically 2 (two) times daily. What changed:  how much to take   insulin NPH Human 100 UNIT/ML injection Commonly known as:  HUMULIN N,NOVOLIN N Inject 0.1 mLs (10 Units total) into the skin at bedtime.   insulin regular 100 units/mL injection Commonly known as:  NOVOLIN R,HUMULIN R Inject 3-8 Units into the skin 3 (three) times daily before meals. Per sliding scale on non-dialysis days   isosorbide-hydrALAZINE 20-37.5 MG tablet Commonly known as:  BIDIL Take 1 tablet by mouth 3 (three) times daily.   lidocaine (PF) 1 % Soln injection Commonly known as:  XYLOCAINE Inject 5 mLs into the skin as needed (topical anesthesia for hemodialysis ifGEBAUERS is ineffective.).   lidocaine-prilocaine cream Commonly known as:   EMLA Apply 1 application topically 3 (three) times a week. 1-2 hours prior to dialysis   loratadine 10 MG tablet Commonly known as:  CLARITIN Take 10 mg by mouth daily as needed for allergies.   multivitamin Tabs tablet Take 1 tablet by mouth daily.   omeprazole 20 MG capsule Commonly known as:  PRILOSEC Take 20 mg by mouth daily.   oxyCODONE-acetaminophen 5-325 MG tablet Commonly known as:  PERCOCET/ROXICET Take 1 tablet by mouth every 4 (four) hours as needed for severe pain.   polyethylene glycol packet Commonly known as:  MIRALAX / GLYCOLAX Take 17 g by mouth daily. What changed:    when to take this  reasons to take this   senna 8.6 MG tablet Commonly known as:  SENOKOT Take 1 tablet by mouth as needed for constipation.       Allergies  Allergen Reactions  . Prednisone Swelling and Other (See Comments)    Excessive fluid buildup  . Lisinopril Cough    Consultations:  Nephrology    Procedures/Studies: Dg Chest 2 View  Result Date: 06/18/2018 CLINICAL DATA:  Shortness of breath this morning. History of asthma, diabetes, hypertension and congestive heart failure. EXAM: CHEST - 2 VIEW COMPARISON:  02/21/2018 and 01/21/2018. FINDINGS: Stable cardiomegaly and diffuse aortic atherosclerosis. Interval increased interstitial and fissural thickening consistent with  worsening pulmonary edema. Small bilateral pleural effusions. No acute osseous findings. Telemetry leads overlie the chest. IMPRESSION: Worsening pulmonary edema and small pleural effusions compared with previous study of 4 months ago, consistent with congestive heart failure. Electronically Signed   By: Richardean Sale M.D.   On: 06/18/2018 12:38   Ct Angio Chest Pe W And/or Wo Contrast  Result Date: 06/18/2018 CLINICAL DATA:  Shortness of breath starting this morning. End-stage renal disease. EXAM: CT ANGIOGRAPHY CHEST WITH CONTRAST TECHNIQUE: Multidetector CT imaging of the chest was performed using the  standard protocol during bolus administration of intravenous contrast. Multiplanar CT image reconstructions and MIPs were obtained to evaluate the vascular anatomy. CONTRAST:  74mL ISOVUE-370 IOPAMIDOL (ISOVUE-370) INJECTION 76% COMPARISON:  Chest radiograph 06/18/2018 FINDINGS: Cardiovascular: No filling defect is identified in the pulmonary arterial tree to suggest pulmonary embolus. Systemic arterial vascular opacification is only minimal but no acute aortic findings are observed. Dense atherosclerotic calcification of the branch vessels, thoracic aorta, and coronary arteries. Moderate cardiomegaly. Interventricular septal thickness 2.3 cm compatible with left ventricular hypertrophy. Mediastinum/Nodes: Mildly dilated esophagus with air-fluid level. Enlarged AP window lymph node 1.6 cm in short axis on image 47/8. Right hilar node 1.3 cm in short axis on image 61/5. Additional prominence of hilar and infrahilar nodal tissue. Indistinct subcarinal node approximately 1.5 cm in short axis on image 69/5. Lungs/Pleura: Small bilateral pleural effusions with associated passive atelectasis. Secondary pulmonary lobular septal thickening and accentuation especially at the lung apices for example on image 23/6, a characteristic finding for pulmonary edema. Minimal perihilar airspace edema. Airway thickening is present, suggesting bronchitis or reactive airways disease. Upper Abdomen: Unremarkable Musculoskeletal: Thoracic spondylosis. Review of the MIP images confirms the above findings. IMPRESSION: 1. No filling defect is identified in the pulmonary arterial tree to suggest pulmonary embolus. 2. Moderate cardiomegaly with interstitial pulmonary edema and faint perihilar airspace edema. Small bilateral pleural effusions with passive atelectasis. 3. There is abnormal adenopathy in the chest, for example an AP window lymph node measuring 1.6 cm in diameter. These lymph nodes are somewhat indistinctly marginated, and I would  tend to favor passive congestion is the likely cause. Strictly speaking, malignancy such as lymphoma is not excluded. 4. Airway thickening is present, suggesting bronchitis or reactive airways disease. 5. Thoracic spondylosis. 6.  Aortic Atherosclerosis (ICD10-I70.0).  Coronary atherosclerosis. Electronically Signed   By: Van Clines M.D.   On: 06/18/2018 17:56       Subjective: Patient feeling better, dyspnea has improved, no chest pain, no nausea or vomiting.   Discharge Exam: Vitals:   06/20/18 1030 06/20/18 1100  BP: (!) 215/92 (!) 180/95  Pulse: 80 77  Resp:    Temp:    SpO2:     Vitals:   06/20/18 0930 06/20/18 1000 06/20/18 1030 06/20/18 1100  BP: (!) 207/93 (!) 216/92 (!) 215/92 (!) 180/95  Pulse: 79 78 80 77  Resp:      Temp:      TempSrc:      SpO2:      Weight:      Height:        General: Not in pain or dyspnea.  Neurology: Awake and alert, non focal  E ENT: no pallor, no icterus, oral mucosa moist Cardiovascular: No JVD. S1-S2 present, rhythmic, no gallops, rubs, or murmurs. No lower extremity edema. Pulmonary: vesicular breath sounds bilaterally, adequate air movement, no wheezing, rhonchi or rales. Gastrointestinal. Abdomen with, no organomegaly, non tender, no rebound or guarding Skin. No  rashes Musculoskeletal: bilateral metatarsal amputation.    The results of significant diagnostics from this hospitalization (including imaging, microbiology, ancillary and laboratory) are listed below for reference.     Microbiology: Recent Results (from the past 240 hour(s))  MRSA PCR Screening     Status: None   Collection Time: 06/18/18 11:58 PM  Result Value Ref Range Status   MRSA by PCR NEGATIVE NEGATIVE Final    Comment:        The GeneXpert MRSA Assay (FDA approved for NASAL specimens only), is one component of a comprehensive MRSA colonization surveillance program. It is not intended to diagnose MRSA infection nor to guide or monitor treatment  for MRSA infections.      Labs: BNP (last 3 results) Recent Labs    06/18/18 1420  BNP 9,562.1*   Basic Metabolic Panel: Recent Labs  Lab 06/18/18 1419 06/19/18 0730 06/20/18 0542  NA 135 132* 133*  K 3.9 3.3* 4.4  CL 96* 96* 99  CO2 29 28 25   GLUCOSE 93 69* 80  BUN 10 11 6*  CREATININE 3.55* 3.49* 3.30*  CALCIUM 8.8* 8.2* 8.1*  PHOS  --  2.8  --    Liver Function Tests: Recent Labs  Lab 06/19/18 0730  ALBUMIN 2.2*   No results for input(s): LIPASE, AMYLASE in the last 168 hours. No results for input(s): AMMONIA in the last 168 hours. CBC: Recent Labs  Lab 06/18/18 1419 06/19/18 0730 06/20/18 0542  WBC 6.3 6.2 6.7  NEUTROABS 3.8  --   --   HGB 10.4* 8.9* 9.9*  HCT 35.8* 30.0* 34.4*  MCV 100.6* 100.3* 100.0  PLT 262 251 253   Cardiac Enzymes: No results for input(s): CKTOTAL, CKMB, CKMBINDEX, TROPONINI in the last 168 hours. BNP: Invalid input(s): POCBNP CBG: Recent Labs  Lab 06/20/18 0504 06/20/18 0643 06/20/18 0751 06/20/18 0936 06/20/18 1103  GLUCAP 79 84 88 88 108*   D-Dimer No results for input(s): DDIMER in the last 72 hours. Hgb A1c No results for input(s): HGBA1C in the last 72 hours. Lipid Profile No results for input(s): CHOL, HDL, LDLCALC, TRIG, CHOLHDL, LDLDIRECT in the last 72 hours. Thyroid function studies No results for input(s): TSH, T4TOTAL, T3FREE, THYROIDAB in the last 72 hours.  Invalid input(s): FREET3 Anemia work up No results for input(s): VITAMINB12, FOLATE, FERRITIN, TIBC, IRON, RETICCTPCT in the last 72 hours. Urinalysis    Component Value Date/Time   COLORURINE YELLOW 07/07/2016 0914   APPEARANCEUR CLOUDY (A) 07/07/2016 0914   LABSPEC >1.030 (H) 07/07/2016 0914   PHURINE 5.5 07/07/2016 0914   GLUCOSEU NEGATIVE 07/07/2016 0914   HGBUR SMALL (A) 07/07/2016 0914   BILIRUBINUR SMALL (A) 07/07/2016 0914   KETONESUR 15 (A) 07/07/2016 0914   PROTEINUR >300 (A) 07/07/2016 0914   UROBILINOGEN 0.2 07/30/2014 2000    NITRITE NEGATIVE 07/07/2016 0914   LEUKOCYTESUR SMALL (A) 07/07/2016 0914   Sepsis Labs Invalid input(s): PROCALCITONIN,  WBC,  LACTICIDVEN Microbiology Recent Results (from the past 240 hour(s))  MRSA PCR Screening     Status: None   Collection Time: 06/18/18 11:58 PM  Result Value Ref Range Status   MRSA by PCR NEGATIVE NEGATIVE Final    Comment:        The GeneXpert MRSA Assay (FDA approved for NASAL specimens only), is one component of a comprehensive MRSA colonization surveillance program. It is not intended to diagnose MRSA infection nor to guide or monitor treatment for MRSA infections.      Time coordinating  discharge: 45 minutes  SIGNED:   Tawni Millers, MD  [facility] 06/20/2018, 11:46 AM Pager [phone]  If 7PM-7AM, please contact night-coverage [email or web address] Password [email or web address]

## 2018-06-20 NOTE — Progress Notes (Signed)
Patient came back from Hemodialysis.  Vitals as follows:   06/20/18 1252  Vitals  Temp 97.9 F (36.6 C)  Temp Source Oral  BP (!) 150/76  MAP (mmHg) 97  BP Location Right Arm  Patient Position (if appropriate) Lying  Pulse Rate 80  Pulse Rate Source Dinamap  Oxygen Therapy  SpO2 99 %  O2 Device Room Air  Pain Assessment  Pain Scale 0-10  Pain Score 0   Family member at bedside.

## 2018-06-20 NOTE — Progress Notes (Deleted)
[facility] Progress Note   Subjective: Awake, alert, on RA without C/O SOB. Concerned about hypertension.   Objective Vitals:   06/20/18 0018 06/20/18 0300 06/20/18 0458 06/20/18 0830  BP: (!) 176/76 (!) 142/63 (!) 170/79 (!) 192/88  Pulse: 79   75  Resp: 18  16   Temp: 98.4 F (36.9 C)  97.6 F (36.4 C)   TempSrc: Oral  Oral   SpO2: 97%  97%   Weight:   63.7 kg   Height:       Physical Exam General: Elderly female in NAD Heart: S1,S2, RRR Lungs: CTAB Anteriorly Abdomen: active BS Extremities: No LE edema. Bilateral transmets.  Dialysis Access: LUA AVG cannulated at present.    Additional Objective Labs: Basic Metabolic Panel: Recent Labs  Lab 06/18/18 1419 06/19/18 0730 06/20/18 0542  NA 135 132* 133*  K 3.9 3.3* 4.4  CL 96* 96* 99  CO2 29 28 25   GLUCOSE 93 69* 80  BUN 10 11 6*  CREATININE 3.55* 3.49* 3.30*  CALCIUM 8.8* 8.2* 8.1*  PHOS  --  2.8  --    Liver Function Tests: Recent Labs  Lab 06/19/18 0730  ALBUMIN 2.2*   No results for input(s): LIPASE, AMYLASE in the last 168 hours. CBC: Recent Labs  Lab 06/18/18 1419 06/19/18 0730 06/20/18 0542  WBC 6.3 6.2 6.7  NEUTROABS 3.8  --   --   HGB 10.4* 8.9* 9.9*  HCT 35.8* 30.0* 34.4*  MCV 100.6* 100.3* 100.0  PLT 262 251 253   Blood Culture    Component Value Date/Time   SDES BLOOD RIGHT ANTECUBITAL 05/07/2018 1029   SPECREQUEST  05/07/2018 1029    BOTTLES DRAWN AEROBIC ONLY Blood Culture adequate volume   CULT  05/07/2018 1029    NO GROWTH 5 DAYS Performed at [facility], [place] [address] [place], [place] [address]    REPTSTATUS 05/12/2018 FINAL 05/07/2018 1029    Cardiac Enzymes: No results for input(s): CKTOTAL, CKMB, CKMBINDEX, TROPONINI in the last 168 hours. CBG: Recent Labs  Lab 06/20/18 0340 06/20/18 0353 06/20/18 0504 06/20/18 0643 06/20/18 0751  GLUCAP 58* 90 79 84 88   Iron Studies: No results for input(s): IRON, TIBC, TRANSFERRIN, FERRITIN in  the last 72 hours. @lablastinr3 @ Studies/Results: Dg Chest 2 View  Result Date: 06/18/2018 CLINICAL DATA:  Shortness of breath this morning. History of asthma, diabetes, hypertension and congestive heart failure. EXAM: CHEST - 2 VIEW COMPARISON:  02/21/2018 and 01/21/2018. FINDINGS: Stable cardiomegaly and diffuse aortic atherosclerosis. Interval increased interstitial and fissural thickening consistent with worsening pulmonary edema. Small bilateral pleural effusions. No acute osseous findings. Telemetry leads overlie the chest. IMPRESSION: Worsening pulmonary edema and small pleural effusions compared with previous study of 4 months ago, consistent with congestive heart failure. Electronically Signed   By: Richardean Sale M.D.   On: 06/18/2018 12:38   Ct Angio Chest Pe W And/or Wo Contrast  Result Date: 06/18/2018 CLINICAL DATA:  Shortness of breath starting this morning. End-stage renal disease. EXAM: CT ANGIOGRAPHY CHEST WITH CONTRAST TECHNIQUE: Multidetector CT imaging of the chest was performed using the standard protocol during bolus administration of intravenous contrast. Multiplanar CT image reconstructions and MIPs were obtained to evaluate the vascular anatomy. CONTRAST:  80mL ISOVUE-370 IOPAMIDOL (ISOVUE-370) INJECTION 76% COMPARISON:  Chest radiograph 06/18/2018 FINDINGS: Cardiovascular: No filling defect is identified in the pulmonary arterial tree to suggest pulmonary embolus. Systemic arterial vascular opacification is only minimal but no acute aortic findings are observed.  Dense atherosclerotic calcification of the branch vessels, thoracic aorta, and coronary arteries. Moderate cardiomegaly. Interventricular septal thickness 2.3 cm compatible with left ventricular hypertrophy. Mediastinum/Nodes: Mildly dilated esophagus with air-fluid level. Enlarged AP window lymph node 1.6 cm in short axis on image 47/8. Right hilar node 1.3 cm in short axis on image 61/5. Additional prominence of hilar  and infrahilar nodal tissue. Indistinct subcarinal node approximately 1.5 cm in short axis on image 69/5. Lungs/Pleura: Small bilateral pleural effusions with associated passive atelectasis. Secondary pulmonary lobular septal thickening and accentuation especially at the lung apices for example on image 23/6, a characteristic finding for pulmonary edema. Minimal perihilar airspace edema. Airway thickening is present, suggesting bronchitis or reactive airways disease. Upper Abdomen: Unremarkable Musculoskeletal: Thoracic spondylosis. Review of the MIP images confirms the above findings. IMPRESSION: 1. No filling defect is identified in the pulmonary arterial tree to suggest pulmonary embolus. 2. Moderate cardiomegaly with interstitial pulmonary edema and faint perihilar airspace edema. Small bilateral pleural effusions with passive atelectasis. 3. There is abnormal adenopathy in the chest, for example an AP window lymph node measuring 1.6 cm in diameter. These lymph nodes are somewhat indistinctly marginated, and I would tend to favor passive congestion is the likely cause. Strictly speaking, malignancy such as lymphoma is not excluded. 4. Airway thickening is present, suggesting bronchitis or reactive airways disease. 5. Thoracic spondylosis. 6.  Aortic Atherosclerosis (ICD10-I70.0).  Coronary atherosclerosis. Electronically Signed   By: Van Clines M.D.   On: 06/18/2018 17:56   Medications: . sodium chloride     . aspirin EC  81 mg Oral Daily  . atorvastatin  20 mg Oral QHS  . carvedilol  12.5 mg Oral BID WC  . Chlorhexidine Gluconate Cloth  6 each Topical Q0600  . cinacalcet  60 mg Oral Q M,W,F-HD  . clopidogrel  75 mg Oral Daily  . dextrose      . fluticasone  1 spray Each Nare Daily  . gabapentin  100 mg Oral QHS  . heparin      . heparin  2,500 Units Dialysis Once in dialysis  . heparin  5,000 Units Subcutaneous Q8H  . hydrocerin  1 application Topical BID  . insulin aspart  0-9 Units  Subcutaneous TID WC  . insulin NPH Human  10 Units Subcutaneous QHS  . isosorbide-hydrALAZINE  1 tablet Oral TID  . loratadine  10 mg Oral Daily  . mometasone-formoterol  2 puff Inhalation BID  . multivitamin  1 tablet Oral Daily  . pantoprazole  40 mg Oral Daily  . sodium chloride flush  3 mL Intravenous Q12H    HD orders: [facility] MWF 4 hrs 180NRe 400/800 62 kgs 3.0 K/ 2.25 Ca UFP 4 L AVG -Heparin 2500 units IV initial bolus, Heparin 1000 units IV mid run -Mircera 200 mcg IV q 2 weeks (last dose 06/13/18 150 mcg IV)   Assessment/Plan: 1. Acute hypoxic Respiratory Failure/Pulmonary edema-improving with HD. Continue lowering volume as tolerated.  2. ESRD -MWF-Holiday schedule. HD today off schedule and again tomorrow to get back on schedule. Usual Heparin. K+ 4.4 3. Anemia - HGB 9.9 ESA due next week. Follow HGB.  4. Secondary hyperparathyroidism - Ca 8.1  C Ca 9.5. Add RFP to todays labs. Continue binders, sensipar.  5. HTN/volume - Very hypertensive. Challenging EDW UFG 3.0 liters. Says she is taking Bidil and carvedilol as OP. If unable to lower BP with volume, will add hydralazine.  6. Nutrition - Albumin 2.2. Prostat, renal vits.  7.  DM-per primary 8. H/O SDH  Ronal Maybury H. Winford Hehn NP-C 06/20/2018, 8:40 AM  [employer] [phone]

## 2018-06-20 NOTE — Progress Notes (Signed)
Patient went to Dialysis . She was alert and oriented. Was transported in bed.

## 2018-06-20 NOTE — Plan of Care (Signed)

## 2018-06-20 NOTE — Progress Notes (Signed)
[facility] Progress Note  Subjective: no c/o, wt's up still despite -3.5 L on HD yest  Vitals:   06/19/18 1945 06/20/18 0018 06/20/18 0300 06/20/18 0458  BP: (!) 163/65 (!) 176/76 (!) 142/63 (!) 170/79  Pulse: 84 79    Resp: 18 18  16   Temp: 99.2 F (37.3 C) 98.4 F (36.9 C)  97.6 F (36.4 C)  TempSrc: Oral Oral  Oral  SpO2: 98% 97%  97%  Weight:    63.7 kg  Height:        Inpatient medications: . aspirin EC  81 mg Oral Daily  . atorvastatin  20 mg Oral QHS  . carvedilol  12.5 mg Oral BID WC  . Chlorhexidine Gluconate Cloth  6 each Topical Q0600  . cinacalcet  60 mg Oral Q M,W,F-HD  . clopidogrel  75 mg Oral Daily  . dextrose      . fluticasone  1 spray Each Nare Daily  . gabapentin  100 mg Oral QHS  . heparin  5,000 Units Subcutaneous Q8H  . hydrocerin  1 application Topical BID  . insulin aspart  0-9 Units Subcutaneous TID WC  . insulin NPH Human  10 Units Subcutaneous QHS  . isosorbide-hydrALAZINE  1 tablet Oral TID  . loratadine  10 mg Oral Daily  . mometasone-formoterol  2 puff Inhalation BID  . multivitamin  1 tablet Oral Daily  . pantoprazole  40 mg Oral Daily  . sodium chloride flush  3 mL Intravenous Q12H   . sodium chloride     sodium chloride, albuterol, diazepam, hydrALAZINE, labetalol, lidocaine (PF), oxyCODONE-acetaminophen, polyethylene glycol, senna, sodium chloride flush  Iron/TIBC/Ferritin/ %Sat    Component Value Date/Time   IRON 31 08/28/2017 0414   TIBC 140 (L) 08/28/2017 0414   FERRITIN 1,854 (H) 08/28/2017 0414   IRONPCTSAT 22 08/28/2017 0414    Exam: General: Elderly female NAD on nasal O2   Head: NCAT sclera not icteric MMM Neck: Supple. No JVD appreciated  Lungs: Diminished, faint crackles at R base  Heart: RRR with S1 S2 Abdomen: soft NT + BS Lower extremities:without edema or ischemic changes, no open wounds  Neuro: A & O  X 3. Moves all extremities spontaneously. Psych:  Responds to questions appropriately  with a normal affect. Dialysis Access: LUE AVG +bruit   Dialysis: [place] MWF  62kg   400/800  3K/2.25 bath  P4  Hep 2500   L AVG  - mircera 150 q 2, last 11/18      Impression/ Plan: 1. Dyspnea / pulm edema - 3.5 L off on HD yest. Plan HD again today, get vol down further, lower edw as tolerated. May be ok for dc after HD today depending on how it goes.  2. ESRD - usual HD MWF.  HD today off sched (holiday sched that is) for volume 3. Hypertension/volume  - BP's up, cont bidil/ coreg.  4. Anemia ckd - Hgb stable. 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 Continue Auryxia binder/Sensipar     Kelly Splinter MD [employer] pager [phone]   06/20/2018, 7:49 AM   Recent Labs  Lab 06/19/18 0730 06/20/18 0542  NA 132* 133*  K 3.3* 4.4  CL 96* 99  CO2 28 25  GLUCOSE 69* 80  BUN 11 6*  CREATININE 3.49* 3.30*  CALCIUM 8.2* 8.1*  PHOS 2.8  --   ALBUMIN 2.2*  --    No results for input(s): AST, ALT,  ALKPHOS, BILITOT, PROT in the last 168 hours. Recent Labs  Lab 06/18/18 1419 06/19/18 0730 06/20/18 0542  WBC 6.3 6.2 6.7  NEUTROABS 3.8  --   --   HGB 10.4* 8.9* 9.9*  HCT 35.8* 30.0* 34.4*  MCV 100.6* 100.3* 100.0  PLT 262 251 253

## 2018-06-20 NOTE — Progress Notes (Signed)
Hypoglycemic Event  CBG: 35  Treatment: 2 1106mL cups of juice and 1/2 peanut butter  Symptoms: lethargic, sweating   Follow-up CBG Time: 6269 CBG Result: 90  Possible Reasons for Event: hemodialysis 11/24 in AM, pt not eating much of her food, scheduled Humulin 10 units given before bed  Comments/MD notified: [facility], Wyline Beady

## 2018-06-21 ENCOUNTER — Other Ambulatory Visit: Payer: Self-pay | Admitting: *Deleted

## 2018-06-21 DIAGNOSIS — M0000 Staphylococcal arthritis, unspecified joint: Secondary | ICD-10-CM | POA: Diagnosis not present

## 2018-06-21 DIAGNOSIS — N2581 Secondary hyperparathyroidism of renal origin: Secondary | ICD-10-CM | POA: Diagnosis not present

## 2018-06-21 DIAGNOSIS — E1122 Type 2 diabetes mellitus with diabetic chronic kidney disease: Secondary | ICD-10-CM | POA: Diagnosis not present

## 2018-06-21 DIAGNOSIS — N186 End stage renal disease: Secondary | ICD-10-CM | POA: Diagnosis not present

## 2018-06-21 DIAGNOSIS — D631 Anemia in chronic kidney disease: Secondary | ICD-10-CM | POA: Diagnosis not present

## 2018-06-21 DIAGNOSIS — E876 Hypokalemia: Secondary | ICD-10-CM | POA: Diagnosis not present

## 2018-06-21 NOTE — Patient Outreach (Signed)
[facility]) Care Management  06/21/2018  Destiny Day [birth year] [id number]  Telephone Assessment  RN spoke with pt post of discharge. Note provider's office completing the transition of care contact. Pt reports she has a post op doctor's visit already scheduled and will be receiving dialysis this week on S/T/F due to the upcoming Holiday. Pt states she is doing well and most of his issues have been resolved. States she is continue to receive [place] with PT/OT. Referral inquires on the following addressed:  DME: stair chairlift RN has requested pt to inquired on this resources with the therapist with [facility] if unsuccessful RN requested pt to contact this RN case manager for additional resources. MEDICATION CO-PAYS: Pt reports she has spoken with her provider and this issues has been resolved as pt declined [facility] to further assist.   RN offered to further assist with managing her recent hospitalization with HF as pt receptive with telephonic call only at this time. RN discussed pt's knowledge based on HF and further educated pt on HF zones. RN verified pt currently remains in the GREEN zone with no reported issues. Will also confirm transportation as pt reports her spouse and family provider sufficient transportation services. RN also inquired on medications as this was reviewed and confirmed pt has all her medications and taking according to the recommendations. Plan of care discussed based upon pt's being receptive to case management services with this RN. Will discuss all goals and interventions accordingly. Will follow up in few weeks to allow adherence with what has been discussed.   Patient was recently discharged from hospital and all medications have been reviewed.  THN CM Care Plan Problem One     Most Recent Value  Care Plan Problem One  Recent hospital related to HF  Role Documenting the Problem One  Care Management [place]  for Problem One  Active  THN Long Term Goal   Pt will not have a readmission related to HF and have an increase knowledge based within the next 90 days  THN Long Term Goal Start Date  06/21/18  Interventions for Problem One Long Term Goal  Will discussed HF action zones and verify pt remains in the GREEN zone since her recent discharge. Will educated accordingly on the zones and the importance of fluid retention along with pt's aware on when to contact her provider and seek medical attention with any related symptoms.  THN CM Short Term Goal #1   Adherence with all post-op medical appointments over the next 30 days.  THN CM Short Term Goal #1 Start Date  06/21/18  Interventions for Short Term Goal #1  Will stress the importance of attending all appointments for any needed changes with her medical condition. Will also verify pt has a pending appointment with her provider's office post- hospital discharge,  [facility] CM Short Term Goal #2   Adherence with post op medicaitons over the next 30 days.  THN CM Short Term Goal #2 Start Date  06/21/18  Interventions for Short Term Goal #2  Will review all medications and verify pt has a sufficient suppply and taking accordingly as prescribed with no delays. Will stress the importance of following the provider's recommendations.       Raina Mina, RN Care Management Coordinator [place] Office [phone]

## 2018-06-22 DIAGNOSIS — E1151 Type 2 diabetes mellitus with diabetic peripheral angiopathy without gangrene: Secondary | ICD-10-CM | POA: Diagnosis not present

## 2018-06-22 DIAGNOSIS — Z471 Aftercare following joint replacement surgery: Secondary | ICD-10-CM | POA: Diagnosis not present

## 2018-06-22 DIAGNOSIS — E1122 Type 2 diabetes mellitus with diabetic chronic kidney disease: Secondary | ICD-10-CM | POA: Diagnosis not present

## 2018-06-22 DIAGNOSIS — I120 Hypertensive chronic kidney disease with stage 5 chronic kidney disease or end stage renal disease: Secondary | ICD-10-CM | POA: Diagnosis not present

## 2018-06-22 DIAGNOSIS — N186 End stage renal disease: Secondary | ICD-10-CM | POA: Diagnosis not present

## 2018-06-22 DIAGNOSIS — J449 Chronic obstructive pulmonary disease, unspecified: Secondary | ICD-10-CM | POA: Diagnosis not present

## 2018-06-24 DIAGNOSIS — N2581 Secondary hyperparathyroidism of renal origin: Secondary | ICD-10-CM | POA: Diagnosis not present

## 2018-06-24 DIAGNOSIS — N186 End stage renal disease: Secondary | ICD-10-CM | POA: Diagnosis not present

## 2018-06-24 DIAGNOSIS — E876 Hypokalemia: Secondary | ICD-10-CM | POA: Diagnosis not present

## 2018-06-24 DIAGNOSIS — M0000 Staphylococcal arthritis, unspecified joint: Secondary | ICD-10-CM | POA: Diagnosis not present

## 2018-06-24 DIAGNOSIS — D631 Anemia in chronic kidney disease: Secondary | ICD-10-CM | POA: Diagnosis not present

## 2018-06-24 DIAGNOSIS — E1122 Type 2 diabetes mellitus with diabetic chronic kidney disease: Secondary | ICD-10-CM | POA: Diagnosis not present

## 2018-06-26 DIAGNOSIS — I159 Secondary hypertension, unspecified: Secondary | ICD-10-CM | POA: Diagnosis not present

## 2018-06-26 DIAGNOSIS — N186 End stage renal disease: Secondary | ICD-10-CM | POA: Diagnosis not present

## 2018-06-26 DIAGNOSIS — Z992 Dependence on renal dialysis: Secondary | ICD-10-CM | POA: Diagnosis not present

## 2018-06-27 DIAGNOSIS — E876 Hypokalemia: Secondary | ICD-10-CM | POA: Diagnosis not present

## 2018-06-27 DIAGNOSIS — M0000 Staphylococcal arthritis, unspecified joint: Secondary | ICD-10-CM | POA: Diagnosis not present

## 2018-06-27 DIAGNOSIS — E1122 Type 2 diabetes mellitus with diabetic chronic kidney disease: Secondary | ICD-10-CM | POA: Diagnosis not present

## 2018-06-27 DIAGNOSIS — N186 End stage renal disease: Secondary | ICD-10-CM | POA: Diagnosis not present

## 2018-06-27 DIAGNOSIS — D631 Anemia in chronic kidney disease: Secondary | ICD-10-CM | POA: Diagnosis not present

## 2018-06-27 DIAGNOSIS — N2581 Secondary hyperparathyroidism of renal origin: Secondary | ICD-10-CM | POA: Diagnosis not present

## 2018-06-28 DIAGNOSIS — N186 End stage renal disease: Secondary | ICD-10-CM | POA: Diagnosis not present

## 2018-06-28 DIAGNOSIS — E1151 Type 2 diabetes mellitus with diabetic peripheral angiopathy without gangrene: Secondary | ICD-10-CM | POA: Diagnosis not present

## 2018-06-28 DIAGNOSIS — J449 Chronic obstructive pulmonary disease, unspecified: Secondary | ICD-10-CM | POA: Diagnosis not present

## 2018-06-28 DIAGNOSIS — E1122 Type 2 diabetes mellitus with diabetic chronic kidney disease: Secondary | ICD-10-CM | POA: Diagnosis not present

## 2018-06-28 DIAGNOSIS — I120 Hypertensive chronic kidney disease with stage 5 chronic kidney disease or end stage renal disease: Secondary | ICD-10-CM | POA: Diagnosis not present

## 2018-06-28 DIAGNOSIS — Z471 Aftercare following joint replacement surgery: Secondary | ICD-10-CM | POA: Diagnosis not present

## 2018-06-29 DIAGNOSIS — N2581 Secondary hyperparathyroidism of renal origin: Secondary | ICD-10-CM | POA: Diagnosis not present

## 2018-06-29 DIAGNOSIS — M0000 Staphylococcal arthritis, unspecified joint: Secondary | ICD-10-CM | POA: Diagnosis not present

## 2018-06-29 DIAGNOSIS — E876 Hypokalemia: Secondary | ICD-10-CM | POA: Diagnosis not present

## 2018-06-29 DIAGNOSIS — E1122 Type 2 diabetes mellitus with diabetic chronic kidney disease: Secondary | ICD-10-CM | POA: Diagnosis not present

## 2018-06-29 DIAGNOSIS — N186 End stage renal disease: Secondary | ICD-10-CM | POA: Diagnosis not present

## 2018-06-29 DIAGNOSIS — D631 Anemia in chronic kidney disease: Secondary | ICD-10-CM | POA: Diagnosis not present

## 2018-06-30 ENCOUNTER — Ambulatory Visit (INDEPENDENT_AMBULATORY_CARE_PROVIDER_SITE_OTHER): Payer: Medicare Other | Admitting: Physician Assistant

## 2018-06-30 ENCOUNTER — Encounter (INDEPENDENT_AMBULATORY_CARE_PROVIDER_SITE_OTHER): Payer: Self-pay | Admitting: Physician Assistant

## 2018-06-30 VITALS — Ht 65.0 in | Wt 133.6 lb

## 2018-06-30 DIAGNOSIS — Z794 Long term (current) use of insulin: Secondary | ICD-10-CM

## 2018-06-30 DIAGNOSIS — N186 End stage renal disease: Secondary | ICD-10-CM

## 2018-06-30 DIAGNOSIS — E1121 Type 2 diabetes mellitus with diabetic nephropathy: Secondary | ICD-10-CM

## 2018-06-30 DIAGNOSIS — Z89431 Acquired absence of right foot: Secondary | ICD-10-CM

## 2018-06-30 DIAGNOSIS — Z992 Dependence on renal dialysis: Secondary | ICD-10-CM

## 2018-06-30 DIAGNOSIS — Z9889 Other specified postprocedural states: Secondary | ICD-10-CM

## 2018-06-30 NOTE — Progress Notes (Signed)
Office Visit Note   Patient: Destiny Day           Date of Birth: 09-29-1950           MRN: [id number] Visit Date: 06/30/2018              Requested by: Velna Hatchet, MD [address] [place], [place] [address] PCP: Velna Hatchet, MD  Chief Complaint  Patient presents with  . Right Knee - Routine Post Op      HPI: The patient is a 67 year old woman who is seen for postoperative follow-up following repeat arthroscopic debridement of her right knee on 06/10/2018.  She had a MSSA septicknee joint and had undergone arthroscopic debridement and treatment with IV antibiotics during dialysis but presented several weeks after this with continued knee pain and swelling.   She reports she has done quite well following the last surgery.  Her last operative culture showed no growth.  She reports no pain or residual swelling over the knee.  She is wanting to resume driving.  Assessment & Plan: Visit Diagnoses:  1. Status post arthroscopy of right knee   2. Type 2 diabetes mellitus with diabetic nephropathy, with long-term current use of insulin (HCC)   3. End-stage renal disease on hemodialysis ([place])   4. History of transmetatarsal amputation of right foot [facility])     Plan: Patient may resume all activities of daily living to tolerance.  We did discuss that she cannot resume driving until her quadriceps weakness has improved sufficiently that she could move her leg back and forth from the brake to the accelerator.  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  She will follow-up here in 6 weeks or sooner should she have difficulties in the interim.  Follow-Up Instructions: Return in about 6 weeks (around 08/11/2018).   Ortho Exam  Patient is alert, oriented, no adenopathy, well-dressed, normal affect, normal respiratory effort. Right knee sutures removed.  Incisions are healing well and there are no signs of cellulitis or infection residually.  She is got good passive and she is  been good passive range of motion.  She does have quite weak quadriceps and is unable to straight leg raise without some assistance and we discussed that she needs to work on this prior to resumption of driving.  She does walk with a walker.  Imaging: No results found. No images are attached to the encounter.  Labs: Lab Results  Component Value Date   HGBA1C 7.2 (H) 05/06/2018   HGBA1C 6.4 (H) 12/31/2017   HGBA1C 5.0 09/03/2017   REPTSTATUS 05/12/2018 FINAL 05/07/2018   GRAMSTAIN  05/06/2018    MODERATE WBC PRESENT, PREDOMINANTLY PMN NO ORGANISMS SEEN    CULT  05/07/2018    NO GROWTH 5 DAYS Performed at [facility], [place] [address], [place], [place] [address]    [place] 05/06/2018     Lab Results  Component Value Date   ALBUMIN 2.2 (L) 06/19/2018   ALBUMIN 2.2 (L) 06/11/2018   ALBUMIN 2.2 (L) 05/09/2018    Body mass index is 22.23 kg/m.  Orders:  No orders of the defined types were placed in this encounter.  No orders of the defined types were placed in this encounter.    Procedures: No procedures performed  Clinical Data: No additional findings.  ROS:  All other systems negative, except as noted in the HPI. Review of Systems  Objective: Vital Signs: Ht 5\' 5"  (1.651 m)   Wt  133 lb 9.6 oz (60.6 kg)   BMI 22.23 kg/m   Specialty Comments:  No specialty comments available.  PMFS History: Patient Active Problem List   Diagnosis Date Noted  . Fluid overload 06/18/2018  . Old bucket handle tear of lateral meniscus of right knee   . Old peripheral tear of medial meniscus of right knee   . Septic arthritis of knee, right ([place]) 05/06/2018  . Degenerative tear of posterior horn of medial meniscus, right   . Old complex tear of lateral meniscus of right knee   . Loose body in knee, right   . Acute pulmonary edema ([place]) 02/21/2018  . 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[place]) 02/03/2018  . End-stage renal disease  on hemodialysis ([place])   . Respiratory failure ([place]) 01/21/2018  . Achilles tendon contracture, right 11/09/2017  . Labile blood glucose   . Anemia of infection and chronic disease   . Essential hypertension   . Black stool   . Right sided weakness   . Subdural hemorrhage following injury ([place]) 08/30/2017  . Diabetic peripheral neuropathy ([place])   . Chronic obstructive pulmonary disease ([place])   . Seizure prophylaxis   . Subdural hematoma ([place]) 08/24/2017  . Traumatic subdural hematoma ([place]) 08/23/2017  . Fall   . SDH (subdural hematoma) ([place])   . Acute respiratory failure with hypoxia ([place])   . Diabetes mellitus type 2 in nonobese (HCC)   . Leukocytosis   . Labile blood pressure   . Generalized anxiety disorder   . Debility 08/16/2017  . Amputated great toe of right foot ([place])   . Amputated toe of left foot ([place])   . Post-operative pain   . ESRD on dialysis ([place])   . Diabetes mellitus ([place])   . Anxiety state   . Benign essential HTN   . Acute blood loss anemia   . Anemia of chronic disease   . Idiopathic chronic venous hypertension of both lower extremities with inflammation 10/13/2016  . S/P transmetatarsal amputation of foot, left ([place]) 09/21/2016  . Onychomycosis 08/15/2016  . PAD (peripheral artery disease) ([place]) 07/08/2016  . Atherosclerosis of native artery of left lower extremity with gangrene ([place]) 06/23/2016  . GERD (gastroesophageal reflux disease) 10/07/2015  . ARF (acute renal failure) ([place])   . Hypothermia 06/12/2015  . Acute on chronic renal failure ([place]) 06/12/2015  . CHF (congestive heart failure) ([place]) 07/30/2014  . CKD (chronic kidney disease) stage 4, GFR 15-29 ml/min (HCC) 06/27/2014  . Hypertensive heart disease 05/25/2013  . CKD (chronic kidney disease) stage 3, GFR 30-59 ml/min (HCC) 05/25/2013  . Pulmonary edema 05/23/2013  . Type 2 diabetes mellitus with diabetic nephropathy ([place]) 05/23/2013  . Type 2 diabetes mellitus with hyperosmolar nonketotic  hyperglycemia ([place]) 04/13/2013  . Acute on chronic diastolic heart failure ([place]) 04/13/2013  . UGI bleed 03/31/2013  . Hypokalemia 08/24/2012  . Type II or unspecified type diabetes mellitus without mention of complication, not stated as uncontrolled 12/27/2007  . HLD (hyperlipidemia) 12/27/2007  . Allergic rhinitis due to pollen 12/27/2007  . Asthma 12/27/2007   Past Medical History:  Diagnosis Date  . Abdominal bruit   . Anemia   . Anxiety   . Arthritis    Osteoarthritis  . Asthma   . Cervical disc disease    "pinced nerve"  . CHF (congestive heart failure) ([place])   . Complication of anesthesia    " after I got home from my last procedure, I started itching."  .  Diabetes mellitus    Type II  . Diverticulitis   . ESRD (end stage renal disease) ([place])    dialysis - M/W/F- [facility]  . GERD (gastroesophageal reflux disease)    from medications  . GI bleed 03/31/2013  . Head injury 07/2017  . History of hiatal hernia   . Hyperlipidemia   . Hypertension   . Neuropathy    left leg  . Osteoporosis   . Peripheral vascular disease ([place])   . Pneumonia    "very young" and a few years ago  . PONV (postoperative nausea and vomiting)   . Seasonal allergies   . Shortness of breath dyspnea    WIth exertion, when fluid builds  . Sleep apnea    can't afford cpap    Family History  Problem Relation Age of Onset  . Other Mother        not sure of cause of death  . Diabetes Father   . Pancreatic cancer Maternal Grandmother   . Colon cancer Neg Hx     Past Surgical History:  Procedure Laterality Date  . A/V SHUNTOGRAM N/A 09/22/2016   Procedure: A/V Shuntogram - left arm;  Surgeon: Serafina Mitchell, MD;  Location: [facility];  Service: Cardiovascular;  Laterality: N/A;  . A/V SHUNTOGRAM N/A 03/22/2018   Procedure: A/V SHUNTOGRAM - left arm;  Surgeon: Serafina Mitchell, MD;  Location: [facility];  Service: Cardiovascular;  Laterality: N/A;  . ABDOMINAL HYSTERECTOMY   1993`  . AMPUTATION Left 09/01/2016   Procedure: LEFT FOOT TRANSMETATARSAL AMPUTATION;  Surgeon: Newt Minion, MD;  Location: [facility];  Service: Orthopedics;  Laterality: Left;  . AMPUTATION Right 08/11/2017   Procedure: RIGHT GREAT TOE AMPUTATION DIGIT;  Surgeon: Rosetta Posner, MD;  Location: [facility];  Service: Vascular;  Laterality: Right;  . AMPUTATION Right 12/31/2017   Procedure: RIGHT TRANSMETATARSAL AMPUTATION;  Surgeon: Newt Minion, MD;  Location: [facility];  Service: Orthopedics;  Laterality: Right;  . AV FISTULA PLACEMENT Left 04/21/2016   Procedure: INSERTION OF ARTERIOVENOUS (AV) GORE-TEX GRAFT ARM LEFT;  Surgeon: Elam Dutch, MD;  Location: [facility];  Service: Vascular;  Laterality: Left;  . [place] TRANSPOSITION Left 07/10/2014   Procedure: BASCILIC VEIN TRANSPOSITION;  Surgeon: Angelia Mould, MD;  Location: [facility];  Service: Vascular;  Laterality: Left;  . BASCILIC VEIN TRANSPOSITION Right 11/08/2014   Procedure: FIRST STAGE BASILIC VEIN TRANSPOSITION;  Surgeon: Angelia Mould, MD;  Location: [facility];  Service: Vascular;  Laterality: Right;  . BASCILIC VEIN TRANSPOSITION Right 01/18/2015   Procedure: SECOND STAGE BASILIC VEIN TRANSPOSITION;  Surgeon: Angelia Mould, MD;  Location: [facility];  Service: Vascular;  Laterality: Right;  . CRANIOTOMY N/A 08/23/2017   Procedure: CRANIOTOMY HEMATOMA EVACUATION SUBDURAL;  Surgeon: Ashok Pall, MD;  Location: [facility];  Service: Neurosurgery;  Laterality: N/A;  . CRANIOTOMY Left 08/24/2017   Procedure: CRANIOTOMY FOR RECURRENT ACUTE SUBDURAL HEMATOMA;  Surgeon: Ashok Pall, MD;  Location: [facility];  Service: Neurosurgery;  Laterality: Left;  . ESOPHAGOGASTRODUODENOSCOPY N/A 03/31/2013   Procedure: ESOPHAGOGASTRODUODENOSCOPY (EGD);  Surgeon: Gatha Mayer, MD;  Location[facility]COPY;  Service: Endoscopy;  Laterality: N/A;  . EYE SURGERY     laser surgery  . FEMORAL-POPLITEAL BYPASS GRAFT Left 07/08/2016   Procedure: LEFT   FEMORAL-BELOW KNEE POPLITEAL ARTERY BYPASS GRAFT USING 6MM X 80 CM PROPATEN GORETEX GRAFT WITH RINGS.;  Surgeon: Rosetta Posner, MD;  Location: [place] [facility] [place];  Service: Vascular;  Laterality: Left;  . FEMORAL-POPLITEAL BYPASS GRAFT Right 08/11/2017   Procedure: RIGHT FEMORAL TO BELOW KNEE POPLITEAL ARTERKY  BYPASS GRAFT USING 6MM RINGED PROPATEN GRAFT;  Surgeon: Rosetta Posner, MD;  Location: [facility];  Service: Vascular;  Laterality: Right;  . FISTULOGRAM Left 10/29/2014   Procedure: FISTULOGRAM;  Surgeon: Angelia Mould, MD;  Location: [facility] CATH LAB;  Service: Cardiovascular;  Laterality: Left;  . KNEE ARTHROSCOPY Right 05/06/2018   Procedure: RIGHT KNEE ARTHROSCOPY;  Surgeon: Newt Minion, MD;  Location: [facility];  Service: Orthopedics;  Laterality: Right;  . KNEE ARTHROSCOPY Right 06/10/2018   Procedure: RIGHT KNEE ARTHROSCOPY AND DEBRIDEMENT;  Surgeon: Newt Minion, MD;  Location: [facility];  Service: Orthopedics;  Laterality: Right;  . LIGATION OF ARTERIOVENOUS  FISTULA Left 04/21/2016   Procedure: LIGATION OF ARTERIOVENOUS  FISTULA LEFT ARM;  Surgeon: Elam Dutch, MD;  Location: [facility];  Service: Vascular;  Laterality: Left;  . LOWER EXTREMITY ANGIOGRAPHY N/A 04/06/2017   Procedure: Lower Extremity Angiography - Right;  Surgeon: Serafina Mitchell, MD;  Location: [facility];  Service: Cardiovascular;  Laterality: N/A;  . PATCH ANGIOPLASTY Right 01/18/2015   Procedure: BASILIC VEIN PATCH ANGIOPLASTY USING VASCUGUARD PATCH;  Surgeon: Angelia Mould, MD;  Location: [facility];  Service: Vascular;  Laterality: Right;  . PERIPHERAL VASCULAR BALLOON ANGIOPLASTY  09/22/2016   Procedure: Peripheral Vascular Balloon Angioplasty;  Surgeon: Serafina Mitchell, MD;  Location: [facility];  Service: Cardiovascular;;  Lt. Fistula  . PERIPHERAL VASCULAR BALLOON ANGIOPLASTY Left 03/22/2018   Procedure: PERIPHERAL VASCULAR BALLOON ANGIOPLASTY;  Surgeon: Serafina Mitchell, MD;  Location: [facility];   Service: Cardiovascular;  Laterality: Left;  Arm shunt  . PERIPHERAL VASCULAR CATHETERIZATION N/A 06/23/2016   Procedure: Abdominal Aortogram w/Lower Extremity;  Surgeon: Serafina Mitchell, MD;  Location: [facility];  Service: Cardiovascular;  Laterality: N/A;  . PERIPHERAL VASCULAR CATHETERIZATION  06/23/2016   Procedure: Peripheral Vascular Intervention;  Surgeon: Serafina Mitchell, MD;  Location: [facility];  Service: Cardiovascular;;  lt common and external illiac artery   Social History   Occupational History  . Occupation: Retired  Tobacco Use  . Smoking status: Former Smoker    Packs/day: 0.35    Years: 40.00    Pack years: 14.00    Types: Cigarettes    Last attempt to quit: 05/10/2012    Years since quitting: 6.1  . Smokeless tobacco: Never Used  Substance and Sexual Activity  . Alcohol use: No  . Drug use: No    Comment: marijuana; quit in early 1980's  . Sexual activity: Yes

## 2018-07-01 ENCOUNTER — Other Ambulatory Visit: Payer: Self-pay

## 2018-07-01 ENCOUNTER — Encounter (INDEPENDENT_AMBULATORY_CARE_PROVIDER_SITE_OTHER): Payer: Self-pay | Admitting: Physician Assistant

## 2018-07-01 DIAGNOSIS — M0000 Staphylococcal arthritis, unspecified joint: Secondary | ICD-10-CM | POA: Diagnosis not present

## 2018-07-01 DIAGNOSIS — N2581 Secondary hyperparathyroidism of renal origin: Secondary | ICD-10-CM | POA: Diagnosis not present

## 2018-07-01 DIAGNOSIS — E876 Hypokalemia: Secondary | ICD-10-CM | POA: Diagnosis not present

## 2018-07-01 DIAGNOSIS — E1122 Type 2 diabetes mellitus with diabetic chronic kidney disease: Secondary | ICD-10-CM | POA: Diagnosis not present

## 2018-07-01 DIAGNOSIS — N186 End stage renal disease: Secondary | ICD-10-CM | POA: Diagnosis not present

## 2018-07-01 DIAGNOSIS — D631 Anemia in chronic kidney disease: Secondary | ICD-10-CM | POA: Diagnosis not present

## 2018-07-01 NOTE — Patient Outreach (Signed)
[facility]) Care Management  07/01/2018  SELENY ALLBRIGHT [birth year] [id number]  Successful outreach to the patient on today's date, HIPAA identifiers confirmed. BSW discussed barriers to locating resources to assist the patient with a chair lift. BSW shared the option to purchase a chair lift from [facility] discount medical supply but the concern for cost may be an issue. The patient reported she has already been in contact with discount supply stores and is unable to afford equipment.   At this time the patient reports she is managing okay without the stair lift. The patient reports her husband and son have moved bedroom furniture into the downstairs area of the home which she can access from the [place]. The downstairs now has a bedroom, bonus room, and bathroom. The patient reports her upstairs is where the kitchen and dining room are.  BSW inquired if the patient could access the upstairs from the front door instead. The patient denies this ability stating the front porch has brick steps and she does not feel comfortable navigating them at this time. BSW offered to refer the patient to a community resource who can assist with ramp construction. The patient declined this referral stating "once I get in the front door I still have steps to go up".   BSW briefly spoke with the patient about the independent living program through vocational rehab which may be able to assist with barriers to accessing the upstairs of her home. BSW discussed the referral process as well as the requirement to provide bank statements and proof of income. The patient declined this referral at this time stating she plans to continue staying in the downstairs at this time.  BSW to perform a discipline closure on today's date. The patient is in agreement with this closure and understands [facility] RN CM Raina Mina will remain active in her case. BSW explained to the patient if she changes her mind about  resources to notify [facility] RN CM who can refer the patient to social work. The patient stated understanding.  Daneen Schick, BSW, CDP [facility] [phone]

## 2018-07-04 DIAGNOSIS — N2581 Secondary hyperparathyroidism of renal origin: Secondary | ICD-10-CM | POA: Diagnosis not present

## 2018-07-04 DIAGNOSIS — D631 Anemia in chronic kidney disease: Secondary | ICD-10-CM | POA: Diagnosis not present

## 2018-07-04 DIAGNOSIS — M0000 Staphylococcal arthritis, unspecified joint: Secondary | ICD-10-CM | POA: Diagnosis not present

## 2018-07-04 DIAGNOSIS — E876 Hypokalemia: Secondary | ICD-10-CM | POA: Diagnosis not present

## 2018-07-04 DIAGNOSIS — N186 End stage renal disease: Secondary | ICD-10-CM | POA: Diagnosis not present

## 2018-07-04 DIAGNOSIS — E1122 Type 2 diabetes mellitus with diabetic chronic kidney disease: Secondary | ICD-10-CM | POA: Diagnosis not present

## 2018-07-05 DIAGNOSIS — I120 Hypertensive chronic kidney disease with stage 5 chronic kidney disease or end stage renal disease: Secondary | ICD-10-CM | POA: Diagnosis not present

## 2018-07-05 DIAGNOSIS — Z471 Aftercare following joint replacement surgery: Secondary | ICD-10-CM | POA: Diagnosis not present

## 2018-07-05 DIAGNOSIS — E1151 Type 2 diabetes mellitus with diabetic peripheral angiopathy without gangrene: Secondary | ICD-10-CM | POA: Diagnosis not present

## 2018-07-05 DIAGNOSIS — E1122 Type 2 diabetes mellitus with diabetic chronic kidney disease: Secondary | ICD-10-CM | POA: Diagnosis not present

## 2018-07-05 DIAGNOSIS — J449 Chronic obstructive pulmonary disease, unspecified: Secondary | ICD-10-CM | POA: Diagnosis not present

## 2018-07-05 DIAGNOSIS — N186 End stage renal disease: Secondary | ICD-10-CM | POA: Diagnosis not present

## 2018-07-06 DIAGNOSIS — N186 End stage renal disease: Secondary | ICD-10-CM | POA: Diagnosis not present

## 2018-07-06 DIAGNOSIS — N2581 Secondary hyperparathyroidism of renal origin: Secondary | ICD-10-CM | POA: Diagnosis not present

## 2018-07-06 DIAGNOSIS — D631 Anemia in chronic kidney disease: Secondary | ICD-10-CM | POA: Diagnosis not present

## 2018-07-06 DIAGNOSIS — M0000 Staphylococcal arthritis, unspecified joint: Secondary | ICD-10-CM | POA: Diagnosis not present

## 2018-07-06 DIAGNOSIS — E876 Hypokalemia: Secondary | ICD-10-CM | POA: Diagnosis not present

## 2018-07-06 DIAGNOSIS — E1122 Type 2 diabetes mellitus with diabetic chronic kidney disease: Secondary | ICD-10-CM | POA: Diagnosis not present

## 2018-07-08 DIAGNOSIS — E1122 Type 2 diabetes mellitus with diabetic chronic kidney disease: Secondary | ICD-10-CM | POA: Diagnosis not present

## 2018-07-08 DIAGNOSIS — N186 End stage renal disease: Secondary | ICD-10-CM | POA: Diagnosis not present

## 2018-07-08 DIAGNOSIS — N2581 Secondary hyperparathyroidism of renal origin: Secondary | ICD-10-CM | POA: Diagnosis not present

## 2018-07-08 DIAGNOSIS — E876 Hypokalemia: Secondary | ICD-10-CM | POA: Diagnosis not present

## 2018-07-08 DIAGNOSIS — D631 Anemia in chronic kidney disease: Secondary | ICD-10-CM | POA: Diagnosis not present

## 2018-07-08 DIAGNOSIS — M0000 Staphylococcal arthritis, unspecified joint: Secondary | ICD-10-CM | POA: Diagnosis not present

## 2018-07-11 DIAGNOSIS — D631 Anemia in chronic kidney disease: Secondary | ICD-10-CM | POA: Diagnosis not present

## 2018-07-11 DIAGNOSIS — N2581 Secondary hyperparathyroidism of renal origin: Secondary | ICD-10-CM | POA: Diagnosis not present

## 2018-07-11 DIAGNOSIS — E1122 Type 2 diabetes mellitus with diabetic chronic kidney disease: Secondary | ICD-10-CM | POA: Diagnosis not present

## 2018-07-11 DIAGNOSIS — E876 Hypokalemia: Secondary | ICD-10-CM | POA: Diagnosis not present

## 2018-07-11 DIAGNOSIS — M0000 Staphylococcal arthritis, unspecified joint: Secondary | ICD-10-CM | POA: Diagnosis not present

## 2018-07-11 DIAGNOSIS — N186 End stage renal disease: Secondary | ICD-10-CM | POA: Diagnosis not present

## 2018-07-12 DIAGNOSIS — E1151 Type 2 diabetes mellitus with diabetic peripheral angiopathy without gangrene: Secondary | ICD-10-CM | POA: Diagnosis not present

## 2018-07-12 DIAGNOSIS — N186 End stage renal disease: Secondary | ICD-10-CM | POA: Diagnosis not present

## 2018-07-12 DIAGNOSIS — J449 Chronic obstructive pulmonary disease, unspecified: Secondary | ICD-10-CM | POA: Diagnosis not present

## 2018-07-12 DIAGNOSIS — Z471 Aftercare following joint replacement surgery: Secondary | ICD-10-CM | POA: Diagnosis not present

## 2018-07-12 DIAGNOSIS — E1122 Type 2 diabetes mellitus with diabetic chronic kidney disease: Secondary | ICD-10-CM | POA: Diagnosis not present

## 2018-07-12 DIAGNOSIS — I120 Hypertensive chronic kidney disease with stage 5 chronic kidney disease or end stage renal disease: Secondary | ICD-10-CM | POA: Diagnosis not present

## 2018-07-13 DIAGNOSIS — N2581 Secondary hyperparathyroidism of renal origin: Secondary | ICD-10-CM | POA: Diagnosis not present

## 2018-07-13 DIAGNOSIS — D631 Anemia in chronic kidney disease: Secondary | ICD-10-CM | POA: Diagnosis not present

## 2018-07-13 DIAGNOSIS — N186 End stage renal disease: Secondary | ICD-10-CM | POA: Diagnosis not present

## 2018-07-13 DIAGNOSIS — M0000 Staphylococcal arthritis, unspecified joint: Secondary | ICD-10-CM | POA: Diagnosis not present

## 2018-07-13 DIAGNOSIS — E1122 Type 2 diabetes mellitus with diabetic chronic kidney disease: Secondary | ICD-10-CM | POA: Diagnosis not present

## 2018-07-13 DIAGNOSIS — E876 Hypokalemia: Secondary | ICD-10-CM | POA: Diagnosis not present

## 2018-07-14 ENCOUNTER — Other Ambulatory Visit: Payer: Self-pay | Admitting: *Deleted

## 2018-07-14 NOTE — Patient Outreach (Addendum)
[facility]) Care Management  07/14/2018  AARIANNA HOADLEY 1950/09/19 [id number]    RN attempted outreach call to pt today at 3 different number (Home [phone] voice message), [phone] (busy)and another number provided by pt [phone] answer)) however only able to leave a HIPAA approved voice message to requesting a call back to one contact number. Will rescheduled another call back over the next week for ongoing [facility] services and inquire on pt's ongoing management of care.  Raina Mina, RN Care Management Coordinator [place] Office [phone]

## 2018-07-15 DIAGNOSIS — D631 Anemia in chronic kidney disease: Secondary | ICD-10-CM | POA: Diagnosis not present

## 2018-07-15 DIAGNOSIS — E876 Hypokalemia: Secondary | ICD-10-CM | POA: Diagnosis not present

## 2018-07-15 DIAGNOSIS — M0000 Staphylococcal arthritis, unspecified joint: Secondary | ICD-10-CM | POA: Diagnosis not present

## 2018-07-15 DIAGNOSIS — N186 End stage renal disease: Secondary | ICD-10-CM | POA: Diagnosis not present

## 2018-07-15 DIAGNOSIS — N2581 Secondary hyperparathyroidism of renal origin: Secondary | ICD-10-CM | POA: Diagnosis not present

## 2018-07-15 DIAGNOSIS — E1122 Type 2 diabetes mellitus with diabetic chronic kidney disease: Secondary | ICD-10-CM | POA: Diagnosis not present

## 2018-07-17 DIAGNOSIS — E876 Hypokalemia: Secondary | ICD-10-CM | POA: Diagnosis not present

## 2018-07-17 DIAGNOSIS — N186 End stage renal disease: Secondary | ICD-10-CM | POA: Diagnosis not present

## 2018-07-17 DIAGNOSIS — D631 Anemia in chronic kidney disease: Secondary | ICD-10-CM | POA: Diagnosis not present

## 2018-07-17 DIAGNOSIS — N2581 Secondary hyperparathyroidism of renal origin: Secondary | ICD-10-CM | POA: Diagnosis not present

## 2018-07-17 DIAGNOSIS — E1122 Type 2 diabetes mellitus with diabetic chronic kidney disease: Secondary | ICD-10-CM | POA: Diagnosis not present

## 2018-07-17 DIAGNOSIS — M0000 Staphylococcal arthritis, unspecified joint: Secondary | ICD-10-CM | POA: Diagnosis not present

## 2018-07-18 DIAGNOSIS — E1122 Type 2 diabetes mellitus with diabetic chronic kidney disease: Secondary | ICD-10-CM | POA: Diagnosis not present

## 2018-07-18 DIAGNOSIS — I120 Hypertensive chronic kidney disease with stage 5 chronic kidney disease or end stage renal disease: Secondary | ICD-10-CM | POA: Diagnosis not present

## 2018-07-18 DIAGNOSIS — J449 Chronic obstructive pulmonary disease, unspecified: Secondary | ICD-10-CM | POA: Diagnosis not present

## 2018-07-18 DIAGNOSIS — N186 End stage renal disease: Secondary | ICD-10-CM | POA: Diagnosis not present

## 2018-07-18 DIAGNOSIS — E1151 Type 2 diabetes mellitus with diabetic peripheral angiopathy without gangrene: Secondary | ICD-10-CM | POA: Diagnosis not present

## 2018-07-18 DIAGNOSIS — Z471 Aftercare following joint replacement surgery: Secondary | ICD-10-CM | POA: Diagnosis not present

## 2018-07-19 ENCOUNTER — Encounter (HOSPITAL_COMMUNITY): Payer: Medicare Other

## 2018-07-19 ENCOUNTER — Ambulatory Visit: Payer: Medicare Other | Admitting: Vascular Surgery

## 2018-07-19 ENCOUNTER — Other Ambulatory Visit: Payer: Self-pay | Admitting: *Deleted

## 2018-07-19 DIAGNOSIS — N2581 Secondary hyperparathyroidism of renal origin: Secondary | ICD-10-CM | POA: Diagnosis not present

## 2018-07-19 DIAGNOSIS — D631 Anemia in chronic kidney disease: Secondary | ICD-10-CM | POA: Diagnosis not present

## 2018-07-19 DIAGNOSIS — N186 End stage renal disease: Secondary | ICD-10-CM | POA: Diagnosis not present

## 2018-07-19 DIAGNOSIS — E876 Hypokalemia: Secondary | ICD-10-CM | POA: Diagnosis not present

## 2018-07-19 DIAGNOSIS — M0000 Staphylococcal arthritis, unspecified joint: Secondary | ICD-10-CM | POA: Diagnosis not present

## 2018-07-19 DIAGNOSIS — E1122 Type 2 diabetes mellitus with diabetic chronic kidney disease: Secondary | ICD-10-CM | POA: Diagnosis not present

## 2018-07-19 NOTE — Patient Outreach (Signed)
[facility]) Care Management  07/19/2018  Destiny Day [birth year] [id number]    RN contacted pt today who was at Dialysis and requested a call back for update on Thursday. RN will follow up on Thursday 12/26 as requested and inquire further on pt's ongoing management of care. Will update plan of care and interventions at that time. No acute needs mentioned at this time.  Raina Mina, RN Care Management Coordinator [place] Office [phone]

## 2018-07-21 ENCOUNTER — Encounter: Payer: Self-pay | Admitting: *Deleted

## 2018-07-21 ENCOUNTER — Other Ambulatory Visit: Payer: Self-pay | Admitting: *Deleted

## 2018-07-21 NOTE — Patient Outreach (Signed)
[facility]) Care Management  07/21/2018  Destiny Day 07/13/1951 [id number]    RN spoke with pt today and received an update on her ongoing management of care. Pt states she continues dialysis on M-W-F however change in scheduled with he Holidays. Discussed pt's current plan of care and inquired on pt's weights. Pt states bilateral amputation to her toes so it is very hard to balance on the home scales so she only gets weighed at the dialysis center in the wheelchair with the last reported weights around 63-64 kg (138.6 and 140.8 lbs). Pt denies any swelling and no symptoms reported today after review of the HF zones. Pt verified she is in the GREEN zone with no acute symptoms. RN discussed the plan of care, goals and updated the interventions according to pt's progress. Based upon today's assessment offered to mail a A.D packet and [facility] calendar as an ongoing monitoring tools to use for her weights (pt very receptive and agreed). Discussed the content in the calendar related to several disease management issues pt has (dabetes, HF and HTN). Pt denies any issues with these condition at this time. RN offered to follow up next month with ongoing case management services and readdress pt's ongoing management of care related to his HF (pt remains receptive). Will update plan of care with two goals met with medications and medical appointments as pt uses [facility]. Will cheduled a follow up call next month related to HF management. No other inquires or request at this time.   THN CM Care Plan Problem One     Most Recent Value  Care Plan Problem One  Recent hospital related to HF  Role Documenting the Problem One  Care Management Coordinator  Care Plan for Problem One  Active  THN Long Term Goal   Pt will not have a readmission related to HF and have an increase knowledge based within the next 90 days  THN Long Term Goal Start Date  06/21/18  Interventions for Problem One Long Term  Goal  Will verify pt's understanding of HF and reiterate on the signs and symptoms of HF zones and what to do if acute. Will verify pt remains in the GREEN zone and aware of the importance of daily monitoring. Will also mail out monitoring tools or her daily monitoring.   THN CM Short Term Goal #1   Adherence with all post-op medical appointments over the next 30 days.  THN CM Short Term Goal #1 Start Date  06/21/18  THN CM Short Term Goal #1 Met Date  07/21/18  THN CM Short Term Goal #2   Adherence with post op medicaitons over the next 30 days.  THN CM Short Term Goal #2 Start Date  06/21/18  [facility] CM Short Term Goal #2 Met Date  07/21/18      Raina Mina, RN Care Management Coordinator [place] Office [phone]

## 2018-07-22 DIAGNOSIS — N2581 Secondary hyperparathyroidism of renal origin: Secondary | ICD-10-CM | POA: Diagnosis not present

## 2018-07-22 DIAGNOSIS — E1122 Type 2 diabetes mellitus with diabetic chronic kidney disease: Secondary | ICD-10-CM | POA: Diagnosis not present

## 2018-07-22 DIAGNOSIS — E876 Hypokalemia: Secondary | ICD-10-CM | POA: Diagnosis not present

## 2018-07-22 DIAGNOSIS — N186 End stage renal disease: Secondary | ICD-10-CM | POA: Diagnosis not present

## 2018-07-22 DIAGNOSIS — M0000 Staphylococcal arthritis, unspecified joint: Secondary | ICD-10-CM | POA: Diagnosis not present

## 2018-07-22 DIAGNOSIS — D631 Anemia in chronic kidney disease: Secondary | ICD-10-CM | POA: Diagnosis not present

## 2018-07-24 DIAGNOSIS — D631 Anemia in chronic kidney disease: Secondary | ICD-10-CM | POA: Diagnosis not present

## 2018-07-24 DIAGNOSIS — M0000 Staphylococcal arthritis, unspecified joint: Secondary | ICD-10-CM | POA: Diagnosis not present

## 2018-07-24 DIAGNOSIS — N2581 Secondary hyperparathyroidism of renal origin: Secondary | ICD-10-CM | POA: Diagnosis not present

## 2018-07-24 DIAGNOSIS — E876 Hypokalemia: Secondary | ICD-10-CM | POA: Diagnosis not present

## 2018-07-24 DIAGNOSIS — E1122 Type 2 diabetes mellitus with diabetic chronic kidney disease: Secondary | ICD-10-CM | POA: Diagnosis not present

## 2018-07-24 DIAGNOSIS — N186 End stage renal disease: Secondary | ICD-10-CM | POA: Diagnosis not present

## 2018-07-25 DIAGNOSIS — Z471 Aftercare following joint replacement surgery: Secondary | ICD-10-CM | POA: Diagnosis not present

## 2018-07-25 DIAGNOSIS — N186 End stage renal disease: Secondary | ICD-10-CM | POA: Diagnosis not present

## 2018-07-25 DIAGNOSIS — J449 Chronic obstructive pulmonary disease, unspecified: Secondary | ICD-10-CM | POA: Diagnosis not present

## 2018-07-25 DIAGNOSIS — E1151 Type 2 diabetes mellitus with diabetic peripheral angiopathy without gangrene: Secondary | ICD-10-CM | POA: Diagnosis not present

## 2018-07-25 DIAGNOSIS — I120 Hypertensive chronic kidney disease with stage 5 chronic kidney disease or end stage renal disease: Secondary | ICD-10-CM | POA: Diagnosis not present

## 2018-07-25 DIAGNOSIS — E1122 Type 2 diabetes mellitus with diabetic chronic kidney disease: Secondary | ICD-10-CM | POA: Diagnosis not present

## 2018-07-26 DIAGNOSIS — E876 Hypokalemia: Secondary | ICD-10-CM | POA: Diagnosis not present

## 2018-07-26 DIAGNOSIS — N2581 Secondary hyperparathyroidism of renal origin: Secondary | ICD-10-CM | POA: Diagnosis not present

## 2018-07-26 DIAGNOSIS — M0000 Staphylococcal arthritis, unspecified joint: Secondary | ICD-10-CM | POA: Diagnosis not present

## 2018-07-26 DIAGNOSIS — E1122 Type 2 diabetes mellitus with diabetic chronic kidney disease: Secondary | ICD-10-CM | POA: Diagnosis not present

## 2018-07-26 DIAGNOSIS — D631 Anemia in chronic kidney disease: Secondary | ICD-10-CM | POA: Diagnosis not present

## 2018-07-26 DIAGNOSIS — N186 End stage renal disease: Secondary | ICD-10-CM | POA: Diagnosis not present

## 2018-07-27 DIAGNOSIS — Z992 Dependence on renal dialysis: Secondary | ICD-10-CM | POA: Diagnosis not present

## 2018-07-27 DIAGNOSIS — N186 End stage renal disease: Secondary | ICD-10-CM | POA: Diagnosis not present

## 2018-07-27 DIAGNOSIS — I159 Secondary hypertension, unspecified: Secondary | ICD-10-CM | POA: Diagnosis not present

## 2018-07-29 ENCOUNTER — Encounter: Payer: Self-pay | Admitting: *Deleted

## 2018-07-29 DIAGNOSIS — E1122 Type 2 diabetes mellitus with diabetic chronic kidney disease: Secondary | ICD-10-CM | POA: Diagnosis not present

## 2018-07-29 DIAGNOSIS — N186 End stage renal disease: Secondary | ICD-10-CM | POA: Diagnosis not present

## 2018-07-29 DIAGNOSIS — M0000 Staphylococcal arthritis, unspecified joint: Secondary | ICD-10-CM | POA: Diagnosis not present

## 2018-07-29 DIAGNOSIS — N2581 Secondary hyperparathyroidism of renal origin: Secondary | ICD-10-CM | POA: Diagnosis not present

## 2018-07-29 DIAGNOSIS — Z992 Dependence on renal dialysis: Secondary | ICD-10-CM | POA: Diagnosis not present

## 2018-07-29 DIAGNOSIS — E876 Hypokalemia: Secondary | ICD-10-CM | POA: Diagnosis not present

## 2018-08-01 DIAGNOSIS — M0000 Staphylococcal arthritis, unspecified joint: Secondary | ICD-10-CM | POA: Diagnosis not present

## 2018-08-01 DIAGNOSIS — Z992 Dependence on renal dialysis: Secondary | ICD-10-CM | POA: Diagnosis not present

## 2018-08-01 DIAGNOSIS — N2581 Secondary hyperparathyroidism of renal origin: Secondary | ICD-10-CM | POA: Diagnosis not present

## 2018-08-01 DIAGNOSIS — E1122 Type 2 diabetes mellitus with diabetic chronic kidney disease: Secondary | ICD-10-CM | POA: Diagnosis not present

## 2018-08-01 DIAGNOSIS — N186 End stage renal disease: Secondary | ICD-10-CM | POA: Diagnosis not present

## 2018-08-01 DIAGNOSIS — E876 Hypokalemia: Secondary | ICD-10-CM | POA: Diagnosis not present

## 2018-08-03 DIAGNOSIS — N2581 Secondary hyperparathyroidism of renal origin: Secondary | ICD-10-CM | POA: Diagnosis not present

## 2018-08-03 DIAGNOSIS — N186 End stage renal disease: Secondary | ICD-10-CM | POA: Diagnosis not present

## 2018-08-03 DIAGNOSIS — E876 Hypokalemia: Secondary | ICD-10-CM | POA: Diagnosis not present

## 2018-08-03 DIAGNOSIS — Z992 Dependence on renal dialysis: Secondary | ICD-10-CM | POA: Diagnosis not present

## 2018-08-03 DIAGNOSIS — E1122 Type 2 diabetes mellitus with diabetic chronic kidney disease: Secondary | ICD-10-CM | POA: Diagnosis not present

## 2018-08-03 DIAGNOSIS — M0000 Staphylococcal arthritis, unspecified joint: Secondary | ICD-10-CM | POA: Diagnosis not present

## 2018-08-04 ENCOUNTER — Ambulatory Visit (INDEPENDENT_AMBULATORY_CARE_PROVIDER_SITE_OTHER): Payer: Medicare Other | Admitting: Physician Assistant

## 2018-08-04 ENCOUNTER — Ambulatory Visit: Payer: Medicare Other | Admitting: Family

## 2018-08-04 ENCOUNTER — Other Ambulatory Visit (HOSPITAL_COMMUNITY): Payer: Medicare Other

## 2018-08-04 ENCOUNTER — Encounter (INDEPENDENT_AMBULATORY_CARE_PROVIDER_SITE_OTHER): Payer: Self-pay | Admitting: Physician Assistant

## 2018-08-04 ENCOUNTER — Encounter (HOSPITAL_COMMUNITY): Payer: Medicare Other

## 2018-08-04 VITALS — Ht 65.0 in | Wt 133.6 lb

## 2018-08-04 DIAGNOSIS — N186 End stage renal disease: Secondary | ICD-10-CM

## 2018-08-04 DIAGNOSIS — Z992 Dependence on renal dialysis: Secondary | ICD-10-CM

## 2018-08-04 DIAGNOSIS — Z9889 Other specified postprocedural states: Secondary | ICD-10-CM

## 2018-08-04 DIAGNOSIS — E1121 Type 2 diabetes mellitus with diabetic nephropathy: Secondary | ICD-10-CM

## 2018-08-04 DIAGNOSIS — Z89431 Acquired absence of right foot: Secondary | ICD-10-CM

## 2018-08-04 DIAGNOSIS — Z794 Long term (current) use of insulin: Secondary | ICD-10-CM

## 2018-08-05 DIAGNOSIS — N186 End stage renal disease: Secondary | ICD-10-CM | POA: Diagnosis not present

## 2018-08-05 DIAGNOSIS — M0000 Staphylococcal arthritis, unspecified joint: Secondary | ICD-10-CM | POA: Diagnosis not present

## 2018-08-05 DIAGNOSIS — E1122 Type 2 diabetes mellitus with diabetic chronic kidney disease: Secondary | ICD-10-CM | POA: Diagnosis not present

## 2018-08-05 DIAGNOSIS — E876 Hypokalemia: Secondary | ICD-10-CM | POA: Diagnosis not present

## 2018-08-05 DIAGNOSIS — N2581 Secondary hyperparathyroidism of renal origin: Secondary | ICD-10-CM | POA: Diagnosis not present

## 2018-08-05 DIAGNOSIS — Z992 Dependence on renal dialysis: Secondary | ICD-10-CM | POA: Diagnosis not present

## 2018-08-06 ENCOUNTER — Encounter (INDEPENDENT_AMBULATORY_CARE_PROVIDER_SITE_OTHER): Payer: Self-pay | Admitting: Physician Assistant

## 2018-08-06 NOTE — Progress Notes (Signed)
Office Visit Note   Patient: Destiny Day           Date of Birth: 09-Aug-1950           MRN: [id number] Visit Date: 08/04/2018              Requested by: Velna Hatchet, MD [address] [place], [place] [address] PCP: Velna Hatchet, MD  Chief Complaint  Patient presents with  . Right Knee - Routine Post Op    06/10/18 scope & deb      HPI: The patient is a 68 year old woman who is seen for postoperative follow-up following a repeat arthroscopic debridement of her right knee on 06/10/2018.  She had MSSA septic knee joint and had undergone arthroscopic debridement and treatment with IV antibiotics during dialysis but presented several weeks postoperatively with continued knee pain and swelling.  She underwent repeat debridement and has done quite well following the last surgery.  She has been ambulating with her walker and reports no pain in the knee and good motion.  She does have a history of transmetatarsal amputation on the right side and wears custom shoe and double upright bracing.  She reports she would like to resume driving and we discussed that she needs to make sure that she is safe and her reaction time is appropriate for driving and she is going to practice and see how she does.  Her family was with her today and concurs that they will monitor her progress.  Assessment & Plan: Visit Diagnoses:  1. Status post arthroscopy of right knee   2. Type 2 diabetes mellitus with diabetic nephropathy, with long-term current use of insulin (HCC)   3. End-stage renal disease on hemodialysis ([place])   4. 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[place])     Plan: She is progressing well.  She did have some dry skin and we discussed using a AmLactin to her dry skin.  She is going to work on driving with supervision of family initially as she would like to drive to and from hemodialysis again and we discussed she needs to make sure that she is safe doing this prior to  resumption of full driving.  She will follow-up here on an as-needed basis.  Follow-Up Instructions: Return if symptoms worsen or fail to improve.   Ortho Exam  Patient is alert, oriented, no adenopathy, well-dressed, normal affect, normal respiratory effort. Patient ambulates with a walker.  Her right knee port sites are well-healed.  Her range of motion is 0 205 degrees actively.  She does wear a double upright brace over her right leg following her trans-met amputation.  Her she has good pedal pulses.  There are no signs of recurrent infection.  Imaging: No results found. No images are attached to the encounter.  Labs: Lab Results  Component Value Date   HGBA1C 7.2 (H) 05/06/2018   HGBA1C 6.4 (H) 12/31/2017   HGBA1C 5.0 09/03/2017   REPTSTATUS 05/12/2018 FINAL 05/07/2018   GRAMSTAIN  05/06/2018    MODERATE WBC PRESENT, PREDOMINANTLY PMN NO ORGANISMS SEEN    CULT  05/07/2018    NO GROWTH 5 DAYS Performed at [facility], [place] [address] [place], [place] [address]    [place] 05/06/2018     Lab Results  Component Value Date   ALBUMIN 2.2 (L) 06/19/2018   ALBUMIN 2.2 (L) 06/11/2018   ALBUMIN 2.2 (L) 05/09/2018    Body mass index is 22.23 kg/m.  Orders:  No orders of the defined types were placed in this encounter.  No orders of the defined types were placed in this encounter.    Procedures: No procedures performed  Clinical Data: No additional findings.  ROS:  All other systems negative, except as noted in the HPI. Review of Systems  Objective: Vital Signs: Ht '5\' 5"'  (1.651 m)   Wt 133 lb 9.6 oz (60.6 kg)   BMI 22.23 kg/m   Specialty Comments:  No specialty comments available.  PMFS History: Patient Active Problem List   Diagnosis Date Noted  . Fluid overload 06/18/2018  . Old bucket handle tear of lateral meniscus of right knee   . Old peripheral tear of medial meniscus of right knee   . Septic arthritis of knee,  right ([place]) 05/06/2018  . Degenerative tear of posterior horn of medial meniscus, right   . Old complex tear of lateral meniscus of right knee   . Loose body in knee, right   . Acute pulmonary edema ([place]) 02/21/2018  . 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[place]) 02/03/2018  . End-stage renal disease on hemodialysis ([place])   . Respiratory failure ([place]) 01/21/2018  . Achilles tendon contracture, right 11/09/2017  . Labile blood glucose   . Anemia of infection and chronic disease   . Essential hypertension   . Black stool   . Right sided weakness   . Subdural hemorrhage following injury ([place]) 08/30/2017  . Diabetic peripheral neuropathy ([place])   . Chronic obstructive pulmonary disease ([place])   . Seizure prophylaxis   . Subdural hematoma ([place]) 08/24/2017  . Traumatic subdural hematoma ([place]) 08/23/2017  . Fall   . SDH (subdural hematoma) ([place])   . Acute respiratory failure with hypoxia ([place])   . Diabetes mellitus type 2 in nonobese (HCC)   . Leukocytosis   . Labile blood pressure   . Generalized anxiety disorder   . Debility 08/16/2017  . Amputated great toe of right foot ([place])   . Amputated toe of left foot ([place])   . Post-operative pain   . ESRD on dialysis ([place])   . Diabetes mellitus ([place])   . Anxiety state   . Benign essential HTN   . Acute blood loss anemia   . Anemia of chronic disease   . Idiopathic chronic venous hypertension of both lower extremities with inflammation 10/13/2016  . S/P transmetatarsal amputation of foot, left ([place]) 09/21/2016  . Onychomycosis 08/15/2016  . PAD (peripheral artery disease) ([place]) 07/08/2016  . Atherosclerosis of native artery of left lower extremity with gangrene ([place]) 06/23/2016  . GERD (gastroesophageal reflux disease) 10/07/2015  . ARF (acute renal failure) ([place])   . Hypothermia 06/12/2015  . Acute on chronic renal failure ([place]) 06/12/2015  . CHF (congestive heart failure) ([place]) 07/30/2014  . CKD (chronic kidney disease)  stage 4, GFR 15-29 ml/min (HCC) 06/27/2014  . Hypertensive heart disease 05/25/2013  . CKD (chronic kidney disease) stage 3, GFR 30-59 ml/min (HCC) 05/25/2013  . Pulmonary edema 05/23/2013  . Type 2 diabetes mellitus with diabetic nephropathy ([facility]) 05/23/2013  . Type 2 diabetes mellitus with hyperosmolar nonketotic hyperglycemia ([place]) 04/13/2013  . Acute on chronic diastolic heart failure ([place]) 04/13/2013  . UGI bleed 03/31/2013  . Hypokalemia 08/24/2012  . Type II or unspecified type diabetes mellitus without mention of complication, not stated as uncontrolled 12/27/2007  . HLD (hyperlipidemia) 12/27/2007  . Allergic rhinitis due to pollen 12/27/2007  . Asthma 12/27/2007   Past Medical History:  Diagnosis Date  . Abdominal bruit   . Anemia   . Anxiety   . Arthritis    Osteoarthritis  . Asthma   . Cervical disc disease    "pinced nerve"  . CHF (congestive heart failure) ([place])   . Complication of anesthesia    " after I got home from my last procedure, I started itching."  . Diabetes mellitus    Type II  . Diverticulitis   . ESRD (end stage renal disease) ([place])    dialysis - M/W/F- [place]  . GERD (gastroesophageal reflux disease)    from medications  . GI bleed 03/31/2013  . Head injury 07/2017  . History of hiatal hernia   . Hyperlipidemia   . Hypertension   . Neuropathy    left leg  . Osteoporosis   . Peripheral vascular disease ([place])   . Pneumonia    "very young" and a few years ago  . PONV (postoperative nausea and vomiting)   . Seasonal allergies   . Shortness of breath dyspnea    WIth exertion, when fluid builds  . Sleep apnea    can't afford cpap    Family History  Problem Relation Age of Onset  . Other Mother        not sure of cause of death  . Diabetes Father   . Pancreatic cancer Maternal Grandmother   . Colon cancer Neg Hx     Past Surgical History:  Procedure Laterality Date  . A/V SHUNTOGRAM N/A 09/22/2016   Procedure: A/V Shuntogram - left  arm;  Surgeon: Serafina Mitchell, MD;  Location: [facility];  Service: Cardiovascular;  Laterality: N/A;  . A/V SHUNTOGRAM N/A 03/22/2018   Procedure: A/V SHUNTOGRAM - left arm;  Surgeon: Serafina Mitchell, MD;  Location: [facility];  Service: Cardiovascular;  Laterality: N/A;  . ABDOMINAL HYSTERECTOMY  1993`  . AMPUTATION Left 09/01/2016   Procedure: LEFT FOOT TRANSMETATARSAL AMPUTATION;  Surgeon: Newt Minion, MD;  Location: [facility];  Service: Orthopedics;  Laterality: Left;  . AMPUTATION Right 08/11/2017   Procedure: RIGHT GREAT TOE AMPUTATION DIGIT;  Surgeon: Rosetta Posner, MD;  Location: [facility];  Service: Vascular;  Laterality: Right;  . AMPUTATION Right 12/31/2017   Procedure: RIGHT TRANSMETATARSAL AMPUTATION;  Surgeon: Newt Minion, MD;  Location: [facility];  Service: Orthopedics;  Laterality: Right;  . AV FISTULA PLACEMENT Left 04/21/2016   Procedure: INSERTION OF ARTERIOVENOUS (AV) GORE-TEX GRAFT ARM LEFT;  Surgeon: Elam Dutch, MD;  Location: [facility];  Service: Vascular;  Laterality: Left;  . [place] TRANSPOSITION Left 07/10/2014   Procedure: BASCILIC VEIN TRANSPOSITION;  Surgeon: Angelia Mould, MD;  Location: [facility];  Service: Vascular;  Laterality: Left;  . BASCILIC VEIN TRANSPOSITION Right 11/08/2014   Procedure: FIRST STAGE BASILIC VEIN TRANSPOSITION;  Surgeon: Angelia Mould, MD;  Location: [facility];  Service: Vascular;  Laterality: Right;  . BASCILIC VEIN TRANSPOSITION Right 01/18/2015   Procedure: SECOND STAGE BASILIC VEIN TRANSPOSITION;  Surgeon: Angelia Mould, MD;  Location: [facility];  Service: Vascular;  Laterality: Right;  . CRANIOTOMY N/A 08/23/2017   Procedure: CRANIOTOMY HEMATOMA EVACUATION SUBDURAL;  Surgeon: Ashok Pall, MD;  Location: [facility];  Service: Neurosurgery;  Laterality: N/A;  . CRANIOTOMY Left 08/24/2017   Procedure: CRANIOTOMY FOR RECURRENT ACUTE SUBDURAL HEMATOMA;  Surgeon: Ashok Pall, MD;  Location: [facility];  Service: Neurosurgery;   Laterality: Left;  . ESOPHAGOGASTRODUODENOSCOPY N/A 03/31/2013   Procedure: ESOPHAGOGASTRODUODENOSCOPY (  EGD);  Surgeon: Gatha Mayer, MD;  Location: [facility];  Service: Endoscopy;  Laterality: N/A;  . EYE SURGERY     laser surgery  . FEMORAL-POPLITEAL BYPASS GRAFT Left 07/08/2016   Procedure: LEFT  FEMORAL-BELOW KNEE POPLITEAL ARTERY BYPASS GRAFT USING 6MM X 80 CM PROPATEN GORETEX GRAFT WITH RINGS.;  Surgeon: Rosetta Posner, MD;  Location: [facility];  Service: Vascular;  Laterality: Left;  . FEMORAL-POPLITEAL BYPASS GRAFT Right 08/11/2017   Procedure: RIGHT FEMORAL TO BELOW KNEE POPLITEAL ARTERKY  BYPASS GRAFT USING 6MM RINGED PROPATEN GRAFT;  Surgeon: Rosetta Posner, MD;  Location: [facility];  Service: Vascular;  Laterality: Right;  . FISTULOGRAM Left 10/29/2014   Procedure: FISTULOGRAM;  Surgeon: Angelia Mould, MD;  Location: [facility];  Service: Cardiovascular;  Laterality: Left;  . KNEE ARTHROSCOPY Right 05/06/2018   Procedure: RIGHT KNEE ARTHROSCOPY;  Surgeon: Newt Minion, MD;  Location: [place] [facility];  Service: Orthopedics;  Laterality: Right;  . KNEE ARTHROSCOPY Right 06/10/2018   Procedure: RIGHT KNEE ARTHROSCOPY AND DEBRIDEMENT;  Surgeon: Newt Minion, MD;  Location: [facility];  Service: Orthopedics;  Laterality: Right;  . LIGATION OF ARTERIOVENOUS  FISTULA Left 04/21/2016   Procedure: LIGATION OF ARTERIOVENOUS  FISTULA LEFT ARM;  Surgeon: Elam Dutch, MD;  Location: [facility];  Service: Vascular;  Laterality: Left;  . LOWER EXTREMITY ANGIOGRAPHY N/A 04/06/2017   Procedure: Lower Extremity Angiography - Right;  Surgeon: Serafina Mitchell, MD;  Location: [facility];  Service: Cardiovascular;  Laterality: N/A;  . PATCH ANGIOPLASTY Right 01/18/2015   Procedure: BASILIC VEIN PATCH ANGIOPLASTY USING VASCUGUARD PATCH;  Surgeon: Angelia Mould, MD;  Location: [facility];  Service: Vascular;  Laterality: Right;  . PERIPHERAL VASCULAR BALLOON ANGIOPLASTY  09/22/2016   Procedure: Peripheral  Vascular Balloon Angioplasty;  Surgeon: Serafina Mitchell, MD;  Location: [facility];  Service: Cardiovascular;;  Lt. Fistula  . PERIPHERAL VASCULAR BALLOON ANGIOPLASTY Left 03/22/2018   Procedure: PERIPHERAL VASCULAR BALLOON ANGIOPLASTY;  Surgeon: Serafina Mitchell, MD;  Location: [facility];  Service: Cardiovascular;  Laterality: Left;  Arm shunt  . PERIPHERAL VASCULAR CATHETERIZATION N/A 06/23/2016   Procedure: Abdominal Aortogram w/Lower Extremity;  Surgeon: Serafina Mitchell, MD;  Location: [facility];  Service: Cardiovascular;  Laterality: N/A;  . PERIPHERAL VASCULAR CATHETERIZATION  06/23/2016   Procedure: Peripheral Vascular Intervention;  Surgeon: Serafina Mitchell, MD;  Location: [facility];  Service: Cardiovascular;;  lt common and external illiac artery   Social History   Occupational History  . Occupation: Retired  Tobacco Use  . Smoking status: Former Smoker    Packs/day: 0.35    Years: 40.00    Pack years: 14.00    Types: Cigarettes    Last attempt to quit: 05/10/2012    Years since quitting: 6.2  . Smokeless tobacco: Never Used  Substance and Sexual Activity  . Alcohol use: No  . Drug use: No    Comment: marijuana; quit in early 1980's  . Sexual activity: Yes

## 2018-08-07 ENCOUNTER — Other Ambulatory Visit: Payer: Self-pay

## 2018-08-07 ENCOUNTER — Ambulatory Visit (HOSPITAL_COMMUNITY)
Admission: EM | Admit: 2018-08-07 | Discharge: 2018-08-07 | Disposition: A | Discharge status: Home / Self Care | Payer: Medicare Other | Attending: Physician Assistant | Admitting: Physician Assistant

## 2018-08-07 ENCOUNTER — Encounter (HOSPITAL_COMMUNITY): Payer: Self-pay | Admitting: *Deleted

## 2018-08-07 DIAGNOSIS — R509 Fever, unspecified: Secondary | ICD-10-CM

## 2018-08-07 DIAGNOSIS — N186 End stage renal disease: Secondary | ICD-10-CM

## 2018-08-07 DIAGNOSIS — J3489 Other specified disorders of nose and nasal sinuses: Secondary | ICD-10-CM

## 2018-08-07 MED ORDER — OSELTAMIVIR PHOSPHATE 30 MG PO CAPS: ORAL_CAPSULE | ORAL | 0 refills | Status: DC

## 2018-08-07 MED ORDER — HYDROCODONE-HOMATROPINE 5-1.5 MG/5ML PO SYRP: 2.5000 mL | ORAL_SOLUTION | Freq: Two times a day (BID) | ORAL | 0 refills | Status: AC

## 2018-08-07 MED ORDER — DOXYCYCLINE HYCLATE 100 MG PO CAPS: 100.0000 mg | ORAL_CAPSULE | Freq: Two times a day (BID) | ORAL | 0 refills | Status: AC

## 2018-08-07 NOTE — Discharge Instructions (Signed)
Please start the Tamiflu and doxycycline tonight.  I have given you some cough syrup.  Please bring all your medications with you to dialysis.

## 2018-08-07 NOTE — ED Provider Notes (Signed)
08/07/2018 5:54 PM   DOB: October 22, 1950 / MRN: [id number]  SUBJECTIVE:  Destiny Day is a 68 y.o. female presenting for cough, nasal congestion, myalgia.  Patient did have the high-dose flu shot earlier in the flu season.  She has a history of several severe chronic illnesses.  Is going to dialysis tomorrow morning.  She is allergic to prednisone and lisinopril.   She  has a past medical history of Abdominal bruit, Anemia, Anxiety, Arthritis, Asthma, Cervical disc disease, CHF (congestive heart failure) ([place]), Complication of anesthesia, Diabetes mellitus, Diverticulitis, ESRD (end stage renal disease) ([place]), GERD (gastroesophageal reflux disease), GI bleed (03/31/2013), Head injury (07/2017), History of hiatal hernia, Hyperlipidemia, Hypertension, Neuropathy, Osteoporosis, Peripheral vascular disease ([place]), Pneumonia, PONV (postoperative nausea and vomiting), Seasonal allergies, Shortness of breath dyspnea, and Sleep apnea.    She  reports that she quit smoking about 6 years ago. Her smoking use included cigarettes. She has a 14.00 pack-year smoking history. She has never used smokeless tobacco. She reports that she does not drink alcohol or use drugs. She  reports being sexually active. The patient  has a past surgical history that includes Abdominal hysterectomy (1993`); Esophagogastroduodenoscopy (N/A, 03/31/2013); Eye surgery; Bascilic vein transposition (Left, 07/10/2014); Fistulogram (Left, 10/29/2014); Bascilic vein transposition (Right, 11/08/2014); Bascilic vein transposition (Right, 01/18/2015); Patch angioplasty (Right, 01/18/2015); Ligation of arteriovenous  fistula (Left, 04/21/2016); AV fistula placement (Left, 04/21/2016); Cardiac catheterization (N/A, 06/23/2016); Cardiac catheterization (06/23/2016); Femoral-popliteal Bypass Graft (Left, 07/08/2016); Amputation (Left, 09/01/2016); A/V SHUNTOGRAM (N/A, 09/22/2016); PERIPHERAL VASCULAR BALLOON ANGIOPLASTY (09/22/2016); Lower Extremity Angiography  (N/A, 04/06/2017); Femoral-popliteal Bypass Graft (Right, 08/11/2017); Amputation (Right, 08/11/2017); Craniotomy (N/A, 08/23/2017); Craniotomy (Left, 08/24/2017); Amputation (Right, 12/31/2017); A/V SHUNTOGRAM (N/A, 03/22/2018); PERIPHERAL VASCULAR BALLOON ANGIOPLASTY (Left, 03/22/2018); Knee arthroscopy (Right, 05/06/2018); and Knee arthroscopy (Right, 06/10/2018).  Her family history includes Diabetes in her father; Other in her mother; Pancreatic cancer in her maternal grandmother.  Review of Systems  Constitutional: Positive for fever.  HENT: Positive for congestion.   Respiratory: Positive for cough. Negative for shortness of breath.   Cardiovascular: Negative for chest pain.  Gastrointestinal: Negative for nausea.  Musculoskeletal: Positive for myalgias.  Skin: Negative for itching and rash.    OBJECTIVE:  BP 113/72   Pulse 91   Temp 100 F (37.8 C)   Resp 20   SpO2 100%   Wt Readings from Last 3 Encounters:  08/04/18 133 lb 9.6 oz (60.6 kg)  06/30/18 133 lb 9.6 oz (60.6 kg)  06/20/18 133 lb 9.6 oz (60.6 kg)   Temp Readings from Last 3 Encounters:  08/07/18 100 F (37.8 C)  06/20/18 97.9 F (36.6 C) (Oral)  06/11/18 98.3 F (36.8 C) (Oral)   BP Readings from Last 3 Encounters:  08/07/18 113/72  06/20/18 (!) 150/76  06/11/18 (!) 148/61   Pulse Readings from Last 3 Encounters:  08/07/18 91  06/20/18 81  06/11/18 84    Physical Exam Vitals signs reviewed.  Constitutional:      General: She is not in acute distress.    Appearance: She is not diaphoretic.  HENT:     Nose: Rhinorrhea (Mucopurulent nasal discharge noted about the right nare.) present.  Eyes:     Pupils: Pupils are equal, round, and reactive to light.  Cardiovascular:     Rate and Rhythm: Normal rate.  Pulmonary:     Effort: Pulmonary effort is normal.  Abdominal:     General: There is no distension.  Skin:    General: Skin is dry.  Neurological:     Mental Status: She is alert and oriented to  person, place, and time.     Cranial Nerves: No cranial nerve deficit.     Gait: Gait normal.     No results found for this or any previous visit (from the past 72 hour(s)).  No results found.  ASSESSMENT AND PLAN:   Febrile illness - Generally she is had the flu shot.  Vitals are stable here.  She is in dialysis.  Of asked her to bring all of her medications prescribed today to dialysis with her which is tomorrow.  End stage renal disease ([place])  Rhinorrhea - She has mucopurulence about the right nare.    Discharge Instructions     Please start the Tamiflu and doxycycline tonight.  I have given you some cough syrup.  Please bring all your medications with you to dialysis.        The patient is advised to call or return to clinic if she does not see an improvement in symptoms, or to seek the care of the closest emergency department if she worsens with the above plan.   Philis Fendt, MHS, PA-C 08/07/2018 5:54 PM   Tereasa Coop, PA-C 08/07/18 1754

## 2018-08-07 NOTE — ED Triage Notes (Signed)
C/O sneezing, cough, nasal congestion over past couple days without fevers.

## 2018-08-08 ENCOUNTER — Other Ambulatory Visit: Payer: Self-pay | Admitting: *Deleted

## 2018-08-08 ENCOUNTER — Telehealth (INDEPENDENT_AMBULATORY_CARE_PROVIDER_SITE_OTHER): Payer: Self-pay | Admitting: Physician Assistant

## 2018-08-08 DIAGNOSIS — E876 Hypokalemia: Secondary | ICD-10-CM | POA: Diagnosis not present

## 2018-08-08 DIAGNOSIS — M0000 Staphylococcal arthritis, unspecified joint: Secondary | ICD-10-CM | POA: Diagnosis not present

## 2018-08-08 DIAGNOSIS — E1122 Type 2 diabetes mellitus with diabetic chronic kidney disease: Secondary | ICD-10-CM | POA: Diagnosis not present

## 2018-08-08 DIAGNOSIS — Z992 Dependence on renal dialysis: Secondary | ICD-10-CM | POA: Diagnosis not present

## 2018-08-08 DIAGNOSIS — N186 End stage renal disease: Secondary | ICD-10-CM | POA: Diagnosis not present

## 2018-08-08 DIAGNOSIS — N2581 Secondary hyperparathyroidism of renal origin: Secondary | ICD-10-CM | POA: Diagnosis not present

## 2018-08-08 NOTE — Patient Outreach (Addendum)
[facility]) Care Management  08/08/2018  CHRISTEE MERVINE 02/26/1951 [id number]  [facility] ED visit 1/12  RN spoke with pt today and discussed the purpose for today's call. Discussed her recent ED visit on yesterday. Pt states "they though it was the flu" but treated her for her allergies. Pt states she has 3 prescriptions and on her way for dialysis today. States she will inform the staff prior to receiving dialysis on all the new prescriptions. RN will confirm pt is feeling better and recovery from her previous symptoms. No other issues or problems have occurred. Based upon pt's pending appointment today will plan on following up with pt later is this month as previously scheduled on 1/28. No other issues reported today as RN continues to support pt on any needed resources or tools to help pt continue to manage her care.   THN CM Care Plan Problem Two     Most Recent Value  Care Plan Problem Two  Recent ED visit for symptoms of the flu/allergies  Role Documenting the Problem Two  Care Management Coordinator  Care Plan for Problem Two  Active  THN CM Short Term Goal #1   Adherence with post op medication over the next 30 days to treat the recent symptoms.  THN CM Short Term Goal #1 Start Date  08/08/18  Interventions for Short Term Goal #2   Will strongly encourage pt to complete all prescribed medication for the symptoms she is encountering. Will offer any needed education concerning the prescibed medications.       Raina Mina, RN Care Management Coordinator [place] Office [phone]

## 2018-08-08 NOTE — Telephone Encounter (Signed)
Patient called needing to know what kind of lotion Shawn told her to get for her leg for dry skin. The number to contact patient is [phone]

## 2018-08-09 ENCOUNTER — Telehealth (INDEPENDENT_AMBULATORY_CARE_PROVIDER_SITE_OTHER): Payer: Self-pay

## 2018-08-09 NOTE — Telephone Encounter (Signed)
I called patient to inform her that the lotion is called Amlactin

## 2018-08-10 DIAGNOSIS — M0000 Staphylococcal arthritis, unspecified joint: Secondary | ICD-10-CM | POA: Diagnosis not present

## 2018-08-10 DIAGNOSIS — N186 End stage renal disease: Secondary | ICD-10-CM | POA: Diagnosis not present

## 2018-08-10 DIAGNOSIS — E876 Hypokalemia: Secondary | ICD-10-CM | POA: Diagnosis not present

## 2018-08-10 DIAGNOSIS — Z992 Dependence on renal dialysis: Secondary | ICD-10-CM | POA: Diagnosis not present

## 2018-08-10 DIAGNOSIS — E1122 Type 2 diabetes mellitus with diabetic chronic kidney disease: Secondary | ICD-10-CM | POA: Diagnosis not present

## 2018-08-10 DIAGNOSIS — N2581 Secondary hyperparathyroidism of renal origin: Secondary | ICD-10-CM | POA: Diagnosis not present

## 2018-08-11 ENCOUNTER — Ambulatory Visit (INDEPENDENT_AMBULATORY_CARE_PROVIDER_SITE_OTHER): Payer: Medicare Other | Admitting: Physician Assistant

## 2018-08-12 DIAGNOSIS — N186 End stage renal disease: Secondary | ICD-10-CM | POA: Diagnosis not present

## 2018-08-12 DIAGNOSIS — E876 Hypokalemia: Secondary | ICD-10-CM | POA: Diagnosis not present

## 2018-08-12 DIAGNOSIS — N2581 Secondary hyperparathyroidism of renal origin: Secondary | ICD-10-CM | POA: Diagnosis not present

## 2018-08-12 DIAGNOSIS — E1122 Type 2 diabetes mellitus with diabetic chronic kidney disease: Secondary | ICD-10-CM | POA: Diagnosis not present

## 2018-08-12 DIAGNOSIS — M0000 Staphylococcal arthritis, unspecified joint: Secondary | ICD-10-CM | POA: Diagnosis not present

## 2018-08-12 DIAGNOSIS — Z992 Dependence on renal dialysis: Secondary | ICD-10-CM | POA: Diagnosis not present

## 2018-08-15 DIAGNOSIS — E876 Hypokalemia: Secondary | ICD-10-CM | POA: Diagnosis not present

## 2018-08-15 DIAGNOSIS — N186 End stage renal disease: Secondary | ICD-10-CM | POA: Diagnosis not present

## 2018-08-15 DIAGNOSIS — E1122 Type 2 diabetes mellitus with diabetic chronic kidney disease: Secondary | ICD-10-CM | POA: Diagnosis not present

## 2018-08-15 DIAGNOSIS — Z992 Dependence on renal dialysis: Secondary | ICD-10-CM | POA: Diagnosis not present

## 2018-08-15 DIAGNOSIS — N2581 Secondary hyperparathyroidism of renal origin: Secondary | ICD-10-CM | POA: Diagnosis not present

## 2018-08-15 DIAGNOSIS — M0000 Staphylococcal arthritis, unspecified joint: Secondary | ICD-10-CM | POA: Diagnosis not present

## 2018-08-17 DIAGNOSIS — N186 End stage renal disease: Secondary | ICD-10-CM | POA: Diagnosis not present

## 2018-08-17 DIAGNOSIS — Z992 Dependence on renal dialysis: Secondary | ICD-10-CM | POA: Diagnosis not present

## 2018-08-17 DIAGNOSIS — N2581 Secondary hyperparathyroidism of renal origin: Secondary | ICD-10-CM | POA: Diagnosis not present

## 2018-08-17 DIAGNOSIS — M0000 Staphylococcal arthritis, unspecified joint: Secondary | ICD-10-CM | POA: Diagnosis not present

## 2018-08-17 DIAGNOSIS — E1122 Type 2 diabetes mellitus with diabetic chronic kidney disease: Secondary | ICD-10-CM | POA: Diagnosis not present

## 2018-08-17 DIAGNOSIS — E876 Hypokalemia: Secondary | ICD-10-CM | POA: Diagnosis not present

## 2018-08-19 DIAGNOSIS — Z992 Dependence on renal dialysis: Secondary | ICD-10-CM | POA: Diagnosis not present

## 2018-08-19 DIAGNOSIS — E1122 Type 2 diabetes mellitus with diabetic chronic kidney disease: Secondary | ICD-10-CM | POA: Diagnosis not present

## 2018-08-19 DIAGNOSIS — M0000 Staphylococcal arthritis, unspecified joint: Secondary | ICD-10-CM | POA: Diagnosis not present

## 2018-08-19 DIAGNOSIS — E876 Hypokalemia: Secondary | ICD-10-CM | POA: Diagnosis not present

## 2018-08-19 DIAGNOSIS — N2581 Secondary hyperparathyroidism of renal origin: Secondary | ICD-10-CM | POA: Diagnosis not present

## 2018-08-19 DIAGNOSIS — N186 End stage renal disease: Secondary | ICD-10-CM | POA: Diagnosis not present

## 2018-08-22 DIAGNOSIS — E1122 Type 2 diabetes mellitus with diabetic chronic kidney disease: Secondary | ICD-10-CM | POA: Diagnosis not present

## 2018-08-22 DIAGNOSIS — N2581 Secondary hyperparathyroidism of renal origin: Secondary | ICD-10-CM | POA: Diagnosis not present

## 2018-08-22 DIAGNOSIS — E876 Hypokalemia: Secondary | ICD-10-CM | POA: Diagnosis not present

## 2018-08-22 DIAGNOSIS — N186 End stage renal disease: Secondary | ICD-10-CM | POA: Diagnosis not present

## 2018-08-22 DIAGNOSIS — Z992 Dependence on renal dialysis: Secondary | ICD-10-CM | POA: Diagnosis not present

## 2018-08-22 DIAGNOSIS — M0000 Staphylococcal arthritis, unspecified joint: Secondary | ICD-10-CM | POA: Diagnosis not present

## 2018-08-23 ENCOUNTER — Other Ambulatory Visit: Payer: Self-pay | Admitting: *Deleted

## 2018-08-23 NOTE — Patient Outreach (Addendum)
[place]) Care Management  08/23/2018  Destiny Day [birth year] [id number]    RN spoke with pt today and received an update on pt ongoing management of care. Pt states she is doing well with no acute issues however reports she is out some of her BP medications and her drug store will not call her provider. Pt states she has an appointment on Thursday this week and will request refills however issues with affording one of the medications stating her co-pay is $70. RN initially requested permission to contact her provider with this request for needed refills (pt agreed). Will stress the importance of taking her BP medications to avoid acute events. Will also offered [place] referral for possible assistance however pt declined indicating she does not qualify for extra help or assistance due to both her and her spouse's income. RN called her provider and left a detail message the her provider's assistance concerning the urgency of this situation due to pt's history of HTN. RN requested a call back to pt or RN to intervene on the needed refill requested to address within 24 hours if possible. RN has updated pt on the message left to her provider's office pending a call to her for further details in needed. RN encouraged the pt to follow up with another call tomorrow if no response today. Pt verbalized an understanding and comply. No symptom at this time related to elevated BP levels and pt is aware of what to do if acute symptoms should occur.   RN inquired on her ongoing management of care related to her HF. Pt reports she continue to undergo dialysis M-W-F and did well on yesterday. Denies any swelling with no reported signs. Will verified pt remains in the GREEN zone with no encountered symptoms since the last conversation with this pt. Will reiterate on the possible symptoms of HF and received feedback on pt's response on what to do if acute. Will discussed the plan of care,  goals and interventions. Will adjust interventions according to pt's progress. Will reiterated on the importance of managing his condition to avoid acute events from occurring. Will offer any needed community resources at this time however declined with no additional needs.  Will strongly encourage adherence with following the plan of care discussed today and will follow up in few weeks on pt's progress. Pt is aware to call RN if there is any needed assistance prior to this scheduled call.   [facility] CM Care Plan Problem One     Most Recent Value  Care Plan Problem One  Recent hospital related to HF  Role Documenting the Problem One  Care Management [place] for Problem One  Active  [facility] Long Term Goal   Pt will not have a readmission related to HF and have an increase knowledge based within the next 90 days  [facility] Long Term Goal Start Date  06/21/18  Interventions for Problem One Long Term Goal  Will reiterate on this goal and review pt's management of care. Will verify pt remains in the GREEN zone with no acute HF symptoms. Will verify no swelling or related symptoms due to weekly dialysis with fluctuating weights     [facility] CM Care Plan Problem Two     Most Recent Value  Care Plan Problem Two  Recent ED visit for symptoms of the flu/allergies  Role Documenting the Problem Two  Care Management Coordinator  Care Plan for Problem Two  Active  [facility] CM  Short Term Goal #1   Adherence with post op medication over the next 30 days to treat the recent symptoms.  [facility] CM Short Term Goal #1 Start Date  08/08/18  Interventions for Short Term Goal #2   Will strongly encouraged pt to follow up with her provider on needed refills and adherence to administering her medications as prescribed. Will extedn to allow adherence with this goal.       Raina Mina, RN Care Management Coordinator [place] Office [phone]

## 2018-08-24 DIAGNOSIS — N186 End stage renal disease: Secondary | ICD-10-CM | POA: Diagnosis not present

## 2018-08-24 DIAGNOSIS — M0000 Staphylococcal arthritis, unspecified joint: Secondary | ICD-10-CM | POA: Diagnosis not present

## 2018-08-24 DIAGNOSIS — E1122 Type 2 diabetes mellitus with diabetic chronic kidney disease: Secondary | ICD-10-CM | POA: Diagnosis not present

## 2018-08-24 DIAGNOSIS — N2581 Secondary hyperparathyroidism of renal origin: Secondary | ICD-10-CM | POA: Diagnosis not present

## 2018-08-24 DIAGNOSIS — Z992 Dependence on renal dialysis: Secondary | ICD-10-CM | POA: Diagnosis not present

## 2018-08-24 DIAGNOSIS — E876 Hypokalemia: Secondary | ICD-10-CM | POA: Diagnosis not present

## 2018-08-26 DIAGNOSIS — N186 End stage renal disease: Secondary | ICD-10-CM | POA: Diagnosis not present

## 2018-08-26 DIAGNOSIS — E1122 Type 2 diabetes mellitus with diabetic chronic kidney disease: Secondary | ICD-10-CM | POA: Diagnosis not present

## 2018-08-26 DIAGNOSIS — M0000 Staphylococcal arthritis, unspecified joint: Secondary | ICD-10-CM | POA: Diagnosis not present

## 2018-08-26 DIAGNOSIS — N2581 Secondary hyperparathyroidism of renal origin: Secondary | ICD-10-CM | POA: Diagnosis not present

## 2018-08-26 DIAGNOSIS — E876 Hypokalemia: Secondary | ICD-10-CM | POA: Diagnosis not present

## 2018-08-26 DIAGNOSIS — Z992 Dependence on renal dialysis: Secondary | ICD-10-CM | POA: Diagnosis not present

## 2018-08-27 DIAGNOSIS — I159 Secondary hypertension, unspecified: Secondary | ICD-10-CM | POA: Diagnosis not present

## 2018-08-27 DIAGNOSIS — Z992 Dependence on renal dialysis: Secondary | ICD-10-CM | POA: Diagnosis not present

## 2018-08-27 DIAGNOSIS — N186 End stage renal disease: Secondary | ICD-10-CM | POA: Diagnosis not present

## 2018-08-29 DIAGNOSIS — N2581 Secondary hyperparathyroidism of renal origin: Secondary | ICD-10-CM | POA: Diagnosis not present

## 2018-08-29 DIAGNOSIS — E876 Hypokalemia: Secondary | ICD-10-CM | POA: Diagnosis not present

## 2018-08-29 DIAGNOSIS — N186 End stage renal disease: Secondary | ICD-10-CM | POA: Diagnosis not present

## 2018-08-29 DIAGNOSIS — E1122 Type 2 diabetes mellitus with diabetic chronic kidney disease: Secondary | ICD-10-CM | POA: Diagnosis not present

## 2018-08-29 DIAGNOSIS — M0000 Staphylococcal arthritis, unspecified joint: Secondary | ICD-10-CM | POA: Diagnosis not present

## 2018-08-31 DIAGNOSIS — M0000 Staphylococcal arthritis, unspecified joint: Secondary | ICD-10-CM | POA: Diagnosis not present

## 2018-08-31 DIAGNOSIS — N186 End stage renal disease: Secondary | ICD-10-CM | POA: Diagnosis not present

## 2018-08-31 DIAGNOSIS — N2581 Secondary hyperparathyroidism of renal origin: Secondary | ICD-10-CM | POA: Diagnosis not present

## 2018-08-31 DIAGNOSIS — E876 Hypokalemia: Secondary | ICD-10-CM | POA: Diagnosis not present

## 2018-08-31 DIAGNOSIS — E1122 Type 2 diabetes mellitus with diabetic chronic kidney disease: Secondary | ICD-10-CM | POA: Diagnosis not present

## 2018-09-01 DIAGNOSIS — E559 Vitamin D deficiency, unspecified: Secondary | ICD-10-CM | POA: Diagnosis not present

## 2018-09-01 DIAGNOSIS — E7849 Other hyperlipidemia: Secondary | ICD-10-CM | POA: Diagnosis not present

## 2018-09-01 DIAGNOSIS — E1151 Type 2 diabetes mellitus with diabetic peripheral angiopathy without gangrene: Secondary | ICD-10-CM | POA: Diagnosis not present

## 2018-09-01 DIAGNOSIS — N186 End stage renal disease: Secondary | ICD-10-CM | POA: Diagnosis not present

## 2018-09-02 DIAGNOSIS — E1122 Type 2 diabetes mellitus with diabetic chronic kidney disease: Secondary | ICD-10-CM | POA: Diagnosis not present

## 2018-09-02 DIAGNOSIS — M0000 Staphylococcal arthritis, unspecified joint: Secondary | ICD-10-CM | POA: Diagnosis not present

## 2018-09-02 DIAGNOSIS — N2581 Secondary hyperparathyroidism of renal origin: Secondary | ICD-10-CM | POA: Diagnosis not present

## 2018-09-02 DIAGNOSIS — E876 Hypokalemia: Secondary | ICD-10-CM | POA: Diagnosis not present

## 2018-09-02 DIAGNOSIS — N186 End stage renal disease: Secondary | ICD-10-CM | POA: Diagnosis not present

## 2018-09-05 DIAGNOSIS — N2581 Secondary hyperparathyroidism of renal origin: Secondary | ICD-10-CM | POA: Diagnosis not present

## 2018-09-05 DIAGNOSIS — N186 End stage renal disease: Secondary | ICD-10-CM | POA: Diagnosis not present

## 2018-09-05 DIAGNOSIS — E1122 Type 2 diabetes mellitus with diabetic chronic kidney disease: Secondary | ICD-10-CM | POA: Diagnosis not present

## 2018-09-05 DIAGNOSIS — M0000 Staphylococcal arthritis, unspecified joint: Secondary | ICD-10-CM | POA: Diagnosis not present

## 2018-09-05 DIAGNOSIS — E876 Hypokalemia: Secondary | ICD-10-CM | POA: Diagnosis not present

## 2018-09-07 DIAGNOSIS — M0000 Staphylococcal arthritis, unspecified joint: Secondary | ICD-10-CM | POA: Diagnosis not present

## 2018-09-07 DIAGNOSIS — E1122 Type 2 diabetes mellitus with diabetic chronic kidney disease: Secondary | ICD-10-CM | POA: Diagnosis not present

## 2018-09-07 DIAGNOSIS — E876 Hypokalemia: Secondary | ICD-10-CM | POA: Diagnosis not present

## 2018-09-07 DIAGNOSIS — N2581 Secondary hyperparathyroidism of renal origin: Secondary | ICD-10-CM | POA: Diagnosis not present

## 2018-09-07 DIAGNOSIS — N186 End stage renal disease: Secondary | ICD-10-CM | POA: Diagnosis not present

## 2018-09-08 DIAGNOSIS — E1122 Type 2 diabetes mellitus with diabetic chronic kidney disease: Secondary | ICD-10-CM | POA: Diagnosis not present

## 2018-09-08 DIAGNOSIS — Z992 Dependence on renal dialysis: Secondary | ICD-10-CM | POA: Diagnosis not present

## 2018-09-08 DIAGNOSIS — I7389 Other specified peripheral vascular diseases: Secondary | ICD-10-CM | POA: Diagnosis not present

## 2018-09-08 DIAGNOSIS — Z1331 Encounter for screening for depression: Secondary | ICD-10-CM | POA: Diagnosis not present

## 2018-09-08 DIAGNOSIS — Z0000 Encounter for general adult medical examination without abnormal findings: Secondary | ICD-10-CM | POA: Diagnosis not present

## 2018-09-08 DIAGNOSIS — N186 End stage renal disease: Secondary | ICD-10-CM | POA: Diagnosis not present

## 2018-09-08 DIAGNOSIS — Z794 Long term (current) use of insulin: Secondary | ICD-10-CM | POA: Diagnosis not present

## 2018-09-08 DIAGNOSIS — Z89432 Acquired absence of left foot: Secondary | ICD-10-CM | POA: Diagnosis not present

## 2018-09-08 DIAGNOSIS — I132 Hypertensive heart and chronic kidney disease with heart failure and with stage 5 chronic kidney disease, or end stage renal disease: Secondary | ICD-10-CM | POA: Diagnosis not present

## 2018-09-08 DIAGNOSIS — E1151 Type 2 diabetes mellitus with diabetic peripheral angiopathy without gangrene: Secondary | ICD-10-CM | POA: Diagnosis not present

## 2018-09-08 DIAGNOSIS — Z6823 Body mass index (BMI) 23.0-23.9, adult: Secondary | ICD-10-CM | POA: Diagnosis not present

## 2018-09-08 DIAGNOSIS — I509 Heart failure, unspecified: Secondary | ICD-10-CM | POA: Diagnosis not present

## 2018-09-09 DIAGNOSIS — E876 Hypokalemia: Secondary | ICD-10-CM | POA: Diagnosis not present

## 2018-09-09 DIAGNOSIS — M0000 Staphylococcal arthritis, unspecified joint: Secondary | ICD-10-CM | POA: Diagnosis not present

## 2018-09-09 DIAGNOSIS — N186 End stage renal disease: Secondary | ICD-10-CM | POA: Diagnosis not present

## 2018-09-09 DIAGNOSIS — N2581 Secondary hyperparathyroidism of renal origin: Secondary | ICD-10-CM | POA: Diagnosis not present

## 2018-09-09 DIAGNOSIS — E1122 Type 2 diabetes mellitus with diabetic chronic kidney disease: Secondary | ICD-10-CM | POA: Diagnosis not present

## 2018-09-12 DIAGNOSIS — N2581 Secondary hyperparathyroidism of renal origin: Secondary | ICD-10-CM | POA: Diagnosis not present

## 2018-09-12 DIAGNOSIS — N186 End stage renal disease: Secondary | ICD-10-CM | POA: Diagnosis not present

## 2018-09-12 DIAGNOSIS — E1122 Type 2 diabetes mellitus with diabetic chronic kidney disease: Secondary | ICD-10-CM | POA: Diagnosis not present

## 2018-09-12 DIAGNOSIS — M0000 Staphylococcal arthritis, unspecified joint: Secondary | ICD-10-CM | POA: Diagnosis not present

## 2018-09-12 DIAGNOSIS — E876 Hypokalemia: Secondary | ICD-10-CM | POA: Diagnosis not present

## 2018-09-14 DIAGNOSIS — E1122 Type 2 diabetes mellitus with diabetic chronic kidney disease: Secondary | ICD-10-CM | POA: Diagnosis not present

## 2018-09-14 DIAGNOSIS — M0000 Staphylococcal arthritis, unspecified joint: Secondary | ICD-10-CM | POA: Diagnosis not present

## 2018-09-14 DIAGNOSIS — N186 End stage renal disease: Secondary | ICD-10-CM | POA: Diagnosis not present

## 2018-09-14 DIAGNOSIS — E876 Hypokalemia: Secondary | ICD-10-CM | POA: Diagnosis not present

## 2018-09-14 DIAGNOSIS — N2581 Secondary hyperparathyroidism of renal origin: Secondary | ICD-10-CM | POA: Diagnosis not present

## 2018-09-16 ENCOUNTER — Other Ambulatory Visit: Payer: Self-pay | Admitting: *Deleted

## 2018-09-16 DIAGNOSIS — Z1212 Encounter for screening for malignant neoplasm of rectum: Secondary | ICD-10-CM | POA: Diagnosis not present

## 2018-09-16 DIAGNOSIS — E876 Hypokalemia: Secondary | ICD-10-CM | POA: Diagnosis not present

## 2018-09-16 DIAGNOSIS — N2581 Secondary hyperparathyroidism of renal origin: Secondary | ICD-10-CM | POA: Diagnosis not present

## 2018-09-16 DIAGNOSIS — N186 End stage renal disease: Secondary | ICD-10-CM | POA: Diagnosis not present

## 2018-09-16 DIAGNOSIS — M0000 Staphylococcal arthritis, unspecified joint: Secondary | ICD-10-CM | POA: Diagnosis not present

## 2018-09-16 DIAGNOSIS — E1122 Type 2 diabetes mellitus with diabetic chronic kidney disease: Secondary | ICD-10-CM | POA: Diagnosis not present

## 2018-09-16 NOTE — Patient Outreach (Signed)
[place] [facility]) Care Management  09/16/2018  KYLEN ISMAEL [birth year] [id number]    RN attempted outreach call today for ongoing update on pt's management of care however pt not available. RN able to leave a HIPAA approved voice message requesting a call back. Will further engage at that time. Will rescheduled another follow call next week for ongoing [facility] services.  Raina Mina, RN Care Management Coordinator [place] Office [phone]

## 2018-09-19 DIAGNOSIS — E876 Hypokalemia: Secondary | ICD-10-CM | POA: Diagnosis not present

## 2018-09-19 DIAGNOSIS — E1122 Type 2 diabetes mellitus with diabetic chronic kidney disease: Secondary | ICD-10-CM | POA: Diagnosis not present

## 2018-09-19 DIAGNOSIS — N2581 Secondary hyperparathyroidism of renal origin: Secondary | ICD-10-CM | POA: Diagnosis not present

## 2018-09-19 DIAGNOSIS — N186 End stage renal disease: Secondary | ICD-10-CM | POA: Diagnosis not present

## 2018-09-19 DIAGNOSIS — M0000 Staphylococcal arthritis, unspecified joint: Secondary | ICD-10-CM | POA: Diagnosis not present

## 2018-09-21 DIAGNOSIS — E876 Hypokalemia: Secondary | ICD-10-CM | POA: Diagnosis not present

## 2018-09-21 DIAGNOSIS — E1122 Type 2 diabetes mellitus with diabetic chronic kidney disease: Secondary | ICD-10-CM | POA: Diagnosis not present

## 2018-09-21 DIAGNOSIS — N2581 Secondary hyperparathyroidism of renal origin: Secondary | ICD-10-CM | POA: Diagnosis not present

## 2018-09-21 DIAGNOSIS — M0000 Staphylococcal arthritis, unspecified joint: Secondary | ICD-10-CM | POA: Diagnosis not present

## 2018-09-21 DIAGNOSIS — N186 End stage renal disease: Secondary | ICD-10-CM | POA: Diagnosis not present

## 2018-09-22 ENCOUNTER — Other Ambulatory Visit: Payer: Self-pay | Admitting: *Deleted

## 2018-09-22 NOTE — Patient Outreach (Signed)
[facility]) Care Management  09/22/2018  Destiny Day 1951/06/19 [id number]  Telephone Assessment (HF)  RN spoke with pt today concerning update on her ongoing management of care. Pt reports she received all her prescribed medications and taking all as recommended. No longer with the Flu-like symptoms after administration of Tamiflu medication. Pt states she feels "a lot better". Denies any symptoms of HF as RN reviewed the HF zones and reiterated on the plan of care related. Pt states she would like a mobile wheel chair. RN encouraged pt to discuss this with her provider for possible evaluation however explained this is a process. Pt verbalized an understanding and will consider talking with her provider on this subject. Review medication listed with updates noted on Amlodipine and Vit D added to pt's list of medications. Educated and verified dosage and administration on these drugs. Will strongly encourage adherence and the purpose of these medications. Will verify pt is doing well with her weekly dialysis with no encountered problems.   Will review and discuss the current plan of care and adjust the interventions accordingly based upon pt's progress. Will discuss possible transition to a health coach next month based upon her progress. Pt verbalized an understanding and receptive to this plan. Will re-evaluate next month and plan accordingly.  [facility] CM Care Plan Problem One     Most Recent Value  Care Plan Problem One  Recent hospital related to HF  Role Documenting the Problem One  Care Management [place] for Problem One  Active  [facility] Long Term Goal   Pt will not have a readmission related to HF and have an increase knowledge based within the next 90 days  [facility] Long Term Goal Start Date  06/21/18  Interventions for Problem One Long Term Goal  Will verify pt remains in the GREEN zone with no acute or encountered problems related to HF. Nettie Elm verify pt  continue to understanding the urgency of early interventions related to any signs or symptoms of HF but contacting her providers if symtpoms should occur in the YELLOW zone. Will review HF zones once again for pt's increase knowledge based.    [facility] CM Care Plan Problem Two     Most Recent Value  Care Plan Problem Two  Recent ED visit for symptoms of the flu/allergies  Role Documenting the Problem Two  Care Management Coordinator  Care Plan for Problem Two  Not Active  [facility] CM Short Term Goal #1   Adherence with post op medication over the next 30 days to treat the recent symptoms.  [facility] CM Short Term Goal #1 Start Date  08/08/18  [facility] CM Short Term Goal #1 Met Date   09/22/18      Raina Mina, RN Care Management Coordinator [place] Office [phone]

## 2018-09-23 DIAGNOSIS — M0000 Staphylococcal arthritis, unspecified joint: Secondary | ICD-10-CM | POA: Diagnosis not present

## 2018-09-23 DIAGNOSIS — E1122 Type 2 diabetes mellitus with diabetic chronic kidney disease: Secondary | ICD-10-CM | POA: Diagnosis not present

## 2018-09-23 DIAGNOSIS — N186 End stage renal disease: Secondary | ICD-10-CM | POA: Diagnosis not present

## 2018-09-23 DIAGNOSIS — E876 Hypokalemia: Secondary | ICD-10-CM | POA: Diagnosis not present

## 2018-09-23 DIAGNOSIS — N2581 Secondary hyperparathyroidism of renal origin: Secondary | ICD-10-CM | POA: Diagnosis not present

## 2018-09-25 DIAGNOSIS — I159 Secondary hypertension, unspecified: Secondary | ICD-10-CM | POA: Diagnosis not present

## 2018-09-25 DIAGNOSIS — N186 End stage renal disease: Secondary | ICD-10-CM | POA: Diagnosis not present

## 2018-09-25 DIAGNOSIS — Z992 Dependence on renal dialysis: Secondary | ICD-10-CM | POA: Diagnosis not present

## 2018-09-26 DIAGNOSIS — E876 Hypokalemia: Secondary | ICD-10-CM | POA: Diagnosis not present

## 2018-09-26 DIAGNOSIS — M0000 Staphylococcal arthritis, unspecified joint: Secondary | ICD-10-CM | POA: Diagnosis not present

## 2018-09-26 DIAGNOSIS — N186 End stage renal disease: Secondary | ICD-10-CM | POA: Diagnosis not present

## 2018-09-26 DIAGNOSIS — D509 Iron deficiency anemia, unspecified: Secondary | ICD-10-CM | POA: Diagnosis not present

## 2018-09-26 DIAGNOSIS — N2581 Secondary hyperparathyroidism of renal origin: Secondary | ICD-10-CM | POA: Diagnosis not present

## 2018-09-26 DIAGNOSIS — E1122 Type 2 diabetes mellitus with diabetic chronic kidney disease: Secondary | ICD-10-CM | POA: Diagnosis not present

## 2018-09-28 ENCOUNTER — Ambulatory Visit (INDEPENDENT_AMBULATORY_CARE_PROVIDER_SITE_OTHER): Payer: Medicare Other

## 2018-09-28 ENCOUNTER — Ambulatory Visit (HOSPITAL_COMMUNITY)
Admission: EM | Admit: 2018-09-28 | Discharge: 2018-09-28 | Disposition: A | Discharge status: Home / Self Care | Payer: Medicare Other | Attending: Internal Medicine | Admitting: Internal Medicine

## 2018-09-28 ENCOUNTER — Encounter (HOSPITAL_COMMUNITY): Payer: Self-pay | Admitting: Emergency Medicine

## 2018-09-28 DIAGNOSIS — E876 Hypokalemia: Secondary | ICD-10-CM | POA: Diagnosis not present

## 2018-09-28 DIAGNOSIS — M25559 Pain in unspecified hip: Secondary | ICD-10-CM | POA: Diagnosis not present

## 2018-09-28 DIAGNOSIS — M25551 Pain in right hip: Secondary | ICD-10-CM

## 2018-09-28 DIAGNOSIS — N250 Renal osteodystrophy: Secondary | ICD-10-CM

## 2018-09-28 DIAGNOSIS — M47816 Spondylosis without myelopathy or radiculopathy, lumbar region: Secondary | ICD-10-CM | POA: Diagnosis not present

## 2018-09-28 DIAGNOSIS — M0000 Staphylococcal arthritis, unspecified joint: Secondary | ICD-10-CM | POA: Diagnosis not present

## 2018-09-28 DIAGNOSIS — D509 Iron deficiency anemia, unspecified: Secondary | ICD-10-CM | POA: Diagnosis not present

## 2018-09-28 DIAGNOSIS — N186 End stage renal disease: Secondary | ICD-10-CM | POA: Diagnosis not present

## 2018-09-28 DIAGNOSIS — E1122 Type 2 diabetes mellitus with diabetic chronic kidney disease: Secondary | ICD-10-CM | POA: Diagnosis not present

## 2018-09-28 DIAGNOSIS — N2581 Secondary hyperparathyroidism of renal origin: Secondary | ICD-10-CM | POA: Diagnosis not present

## 2018-09-28 MED ORDER — OXYCODONE-ACETAMINOPHEN 5-325 MG PO TABS: 1.0000 | ORAL_TABLET | ORAL | 0 refills | Status: DC | PRN

## 2018-09-28 NOTE — ED Triage Notes (Signed)
Pt c/o R hip pain for several days, states it hurts worse with movement, c/o several pain. Dialysis pt.

## 2018-09-29 ENCOUNTER — Other Ambulatory Visit: Payer: Self-pay | Admitting: *Deleted

## 2018-09-29 NOTE — Patient Outreach (Signed)
[place] [facility]) Care Management  09/29/2018  ADEJA SARRATT 1950-09-15 [id number]    ED visit on 3/4 for Hip Pain  RN spoke with pt today and received an update. Pt states she will speak with her dialysis provider at the kidney center on Monday when the provider is available concerning the recommendations from the recent discharge regarding calcium supplements. No other issues related as pt indicated she is doing "well".  RN inquired if there are any immediate needs at this time (pt denied).   Plan: RN will updated the current plan of care as pt reports she remains in the GREEN zone (HF) with no acute symptoms since the last conversation. Will continue to review and encouraged adherence with her management of care. Will follow up in few weeks on pt's care. If pt continue to manage her care with no acute problems or needs will refer to a [place] for ongoing disease management of care.  Raina Mina, RN Care Management Coordinator [place] Office [phone]

## 2018-09-30 DIAGNOSIS — E1122 Type 2 diabetes mellitus with diabetic chronic kidney disease: Secondary | ICD-10-CM | POA: Diagnosis not present

## 2018-09-30 DIAGNOSIS — E876 Hypokalemia: Secondary | ICD-10-CM | POA: Diagnosis not present

## 2018-09-30 DIAGNOSIS — N2581 Secondary hyperparathyroidism of renal origin: Secondary | ICD-10-CM | POA: Diagnosis not present

## 2018-09-30 DIAGNOSIS — D509 Iron deficiency anemia, unspecified: Secondary | ICD-10-CM | POA: Diagnosis not present

## 2018-09-30 DIAGNOSIS — N186 End stage renal disease: Secondary | ICD-10-CM | POA: Diagnosis not present

## 2018-09-30 DIAGNOSIS — M0000 Staphylococcal arthritis, unspecified joint: Secondary | ICD-10-CM | POA: Diagnosis not present

## 2018-10-03 DIAGNOSIS — N2581 Secondary hyperparathyroidism of renal origin: Secondary | ICD-10-CM | POA: Diagnosis not present

## 2018-10-03 DIAGNOSIS — M0000 Staphylococcal arthritis, unspecified joint: Secondary | ICD-10-CM | POA: Diagnosis not present

## 2018-10-03 DIAGNOSIS — E876 Hypokalemia: Secondary | ICD-10-CM | POA: Diagnosis not present

## 2018-10-03 DIAGNOSIS — N186 End stage renal disease: Secondary | ICD-10-CM | POA: Diagnosis not present

## 2018-10-03 DIAGNOSIS — E1122 Type 2 diabetes mellitus with diabetic chronic kidney disease: Secondary | ICD-10-CM | POA: Diagnosis not present

## 2018-10-03 DIAGNOSIS — D509 Iron deficiency anemia, unspecified: Secondary | ICD-10-CM | POA: Diagnosis not present

## 2018-10-05 DIAGNOSIS — N2581 Secondary hyperparathyroidism of renal origin: Secondary | ICD-10-CM | POA: Diagnosis not present

## 2018-10-05 DIAGNOSIS — E876 Hypokalemia: Secondary | ICD-10-CM | POA: Diagnosis not present

## 2018-10-05 DIAGNOSIS — D509 Iron deficiency anemia, unspecified: Secondary | ICD-10-CM | POA: Diagnosis not present

## 2018-10-05 DIAGNOSIS — E1122 Type 2 diabetes mellitus with diabetic chronic kidney disease: Secondary | ICD-10-CM | POA: Diagnosis not present

## 2018-10-05 DIAGNOSIS — N186 End stage renal disease: Secondary | ICD-10-CM | POA: Diagnosis not present

## 2018-10-05 DIAGNOSIS — M0000 Staphylococcal arthritis, unspecified joint: Secondary | ICD-10-CM | POA: Diagnosis not present

## 2018-10-07 DIAGNOSIS — E1122 Type 2 diabetes mellitus with diabetic chronic kidney disease: Secondary | ICD-10-CM | POA: Diagnosis not present

## 2018-10-07 DIAGNOSIS — N186 End stage renal disease: Secondary | ICD-10-CM | POA: Diagnosis not present

## 2018-10-07 DIAGNOSIS — D509 Iron deficiency anemia, unspecified: Secondary | ICD-10-CM | POA: Diagnosis not present

## 2018-10-07 DIAGNOSIS — M0000 Staphylococcal arthritis, unspecified joint: Secondary | ICD-10-CM | POA: Diagnosis not present

## 2018-10-07 DIAGNOSIS — N2581 Secondary hyperparathyroidism of renal origin: Secondary | ICD-10-CM | POA: Diagnosis not present

## 2018-10-07 DIAGNOSIS — E876 Hypokalemia: Secondary | ICD-10-CM | POA: Diagnosis not present

## 2018-10-10 DIAGNOSIS — N2581 Secondary hyperparathyroidism of renal origin: Secondary | ICD-10-CM | POA: Diagnosis not present

## 2018-10-10 DIAGNOSIS — D509 Iron deficiency anemia, unspecified: Secondary | ICD-10-CM | POA: Diagnosis not present

## 2018-10-10 DIAGNOSIS — N186 End stage renal disease: Secondary | ICD-10-CM | POA: Diagnosis not present

## 2018-10-10 DIAGNOSIS — E876 Hypokalemia: Secondary | ICD-10-CM | POA: Diagnosis not present

## 2018-10-10 DIAGNOSIS — E1122 Type 2 diabetes mellitus with diabetic chronic kidney disease: Secondary | ICD-10-CM | POA: Diagnosis not present

## 2018-10-10 DIAGNOSIS — M0000 Staphylococcal arthritis, unspecified joint: Secondary | ICD-10-CM | POA: Diagnosis not present

## 2018-10-11 DIAGNOSIS — E113512 Type 2 diabetes mellitus with proliferative diabetic retinopathy with macular edema, left eye: Secondary | ICD-10-CM | POA: Diagnosis not present

## 2018-10-11 DIAGNOSIS — H35371 Puckering of macula, right eye: Secondary | ICD-10-CM | POA: Diagnosis not present

## 2018-10-11 DIAGNOSIS — H2513 Age-related nuclear cataract, bilateral: Secondary | ICD-10-CM | POA: Diagnosis not present

## 2018-10-11 DIAGNOSIS — H35372 Puckering of macula, left eye: Secondary | ICD-10-CM | POA: Diagnosis not present

## 2018-10-11 DIAGNOSIS — E113531 Type 2 diabetes mellitus with proliferative diabetic retinopathy with traction retinal detachment not involving the macula, right eye: Secondary | ICD-10-CM | POA: Diagnosis not present

## 2018-10-11 DIAGNOSIS — E113511 Type 2 diabetes mellitus with proliferative diabetic retinopathy with macular edema, right eye: Secondary | ICD-10-CM | POA: Diagnosis not present

## 2018-10-11 DIAGNOSIS — E113532 Type 2 diabetes mellitus with proliferative diabetic retinopathy with traction retinal detachment not involving the macula, left eye: Secondary | ICD-10-CM | POA: Diagnosis not present

## 2018-10-11 DIAGNOSIS — H3582 Retinal ischemia: Secondary | ICD-10-CM | POA: Diagnosis not present

## 2018-10-12 DIAGNOSIS — E1122 Type 2 diabetes mellitus with diabetic chronic kidney disease: Secondary | ICD-10-CM | POA: Diagnosis not present

## 2018-10-12 DIAGNOSIS — E876 Hypokalemia: Secondary | ICD-10-CM | POA: Diagnosis not present

## 2018-10-12 DIAGNOSIS — M0000 Staphylococcal arthritis, unspecified joint: Secondary | ICD-10-CM | POA: Diagnosis not present

## 2018-10-12 DIAGNOSIS — N2581 Secondary hyperparathyroidism of renal origin: Secondary | ICD-10-CM | POA: Diagnosis not present

## 2018-10-12 DIAGNOSIS — N186 End stage renal disease: Secondary | ICD-10-CM | POA: Diagnosis not present

## 2018-10-12 DIAGNOSIS — D509 Iron deficiency anemia, unspecified: Secondary | ICD-10-CM | POA: Diagnosis not present

## 2018-10-13 ENCOUNTER — Other Ambulatory Visit: Payer: Self-pay | Admitting: *Deleted

## 2018-10-13 ENCOUNTER — Other Ambulatory Visit: Payer: Self-pay

## 2018-10-13 NOTE — Patient Outreach (Signed)
[facility]) Care Management  10/13/2018  SERAPHINA MITCHNER Jan 05, 1951 [id number]  RN spoke with pt today and verified her ongoing management of care. Pt verified she remains in the GREEN zone with no acute symptoms however unable to recite such symptoms that have been discussed over several months. Will review with teach back method and continue to encouraged ongoing adherence with her management of care. Will strongly encouraged pt to monitoring her volume daily and report any of the discussed symptoms to her provider for early intervention. Pt verbalized an understanding. Based upon no hospitalization and pt received dialysis M-W-F with no acute symptoms over the next few month. RN will discuss referral to a health coach for ongoing management of care via HTN services related to her HF (pt agreed). Will strongly encouraged pt to weight daily and again report any abnormal findings related to fluid retention or exacerbation of HF signs to her provider. No additional resources or referral needed at this time. Case will be closed by this RN case manager. Plan care review and updated accordingly with adjusted interventions reflecting pt's progress.   [facility] CM Care Plan Problem One     Most Recent Value  Care Plan Problem One  Recent hospital related to HF  Role Documenting the Problem One  Care Management [place] for Problem One  Active  [facility] Long Term Goal   Pt will not have a readmission related to HF and have an increase knowledge based within the next 90 days  [facility] Long Term Goal Start Date  06/21/18  Interventions for Problem One Long Term Goal  Will continue to review the HF zones and verified pt remains in the GREEN zone with no acute symptoms over the last month. Pt continue to need ongoing review of symptoms and what to do if acute encounters occur. Will refer to [facility] for ongoing disease management related to pt's HF.       Raina Mina, RN Care Management  Coordinator [place] Office [phone]

## 2018-10-14 ENCOUNTER — Other Ambulatory Visit: Payer: Self-pay | Admitting: *Deleted

## 2018-10-14 DIAGNOSIS — D509 Iron deficiency anemia, unspecified: Secondary | ICD-10-CM | POA: Diagnosis not present

## 2018-10-14 DIAGNOSIS — N2581 Secondary hyperparathyroidism of renal origin: Secondary | ICD-10-CM | POA: Diagnosis not present

## 2018-10-14 DIAGNOSIS — N186 End stage renal disease: Secondary | ICD-10-CM | POA: Diagnosis not present

## 2018-10-14 DIAGNOSIS — E876 Hypokalemia: Secondary | ICD-10-CM | POA: Diagnosis not present

## 2018-10-14 DIAGNOSIS — M0000 Staphylococcal arthritis, unspecified joint: Secondary | ICD-10-CM | POA: Diagnosis not present

## 2018-10-14 DIAGNOSIS — E1122 Type 2 diabetes mellitus with diabetic chronic kidney disease: Secondary | ICD-10-CM | POA: Diagnosis not present

## 2018-10-17 DIAGNOSIS — M0000 Staphylococcal arthritis, unspecified joint: Secondary | ICD-10-CM | POA: Diagnosis not present

## 2018-10-17 DIAGNOSIS — D509 Iron deficiency anemia, unspecified: Secondary | ICD-10-CM | POA: Diagnosis not present

## 2018-10-17 DIAGNOSIS — N186 End stage renal disease: Secondary | ICD-10-CM | POA: Diagnosis not present

## 2018-10-17 DIAGNOSIS — E876 Hypokalemia: Secondary | ICD-10-CM | POA: Diagnosis not present

## 2018-10-17 DIAGNOSIS — E1122 Type 2 diabetes mellitus with diabetic chronic kidney disease: Secondary | ICD-10-CM | POA: Diagnosis not present

## 2018-10-17 DIAGNOSIS — N2581 Secondary hyperparathyroidism of renal origin: Secondary | ICD-10-CM | POA: Diagnosis not present

## 2018-10-19 DIAGNOSIS — E876 Hypokalemia: Secondary | ICD-10-CM | POA: Diagnosis not present

## 2018-10-19 DIAGNOSIS — M0000 Staphylococcal arthritis, unspecified joint: Secondary | ICD-10-CM | POA: Diagnosis not present

## 2018-10-19 DIAGNOSIS — D509 Iron deficiency anemia, unspecified: Secondary | ICD-10-CM | POA: Diagnosis not present

## 2018-10-19 DIAGNOSIS — N186 End stage renal disease: Secondary | ICD-10-CM | POA: Diagnosis not present

## 2018-10-19 DIAGNOSIS — E1122 Type 2 diabetes mellitus with diabetic chronic kidney disease: Secondary | ICD-10-CM | POA: Diagnosis not present

## 2018-10-19 DIAGNOSIS — N2581 Secondary hyperparathyroidism of renal origin: Secondary | ICD-10-CM | POA: Diagnosis not present

## 2018-10-21 DIAGNOSIS — N186 End stage renal disease: Secondary | ICD-10-CM | POA: Diagnosis not present

## 2018-10-21 DIAGNOSIS — E1122 Type 2 diabetes mellitus with diabetic chronic kidney disease: Secondary | ICD-10-CM | POA: Diagnosis not present

## 2018-10-21 DIAGNOSIS — M0000 Staphylococcal arthritis, unspecified joint: Secondary | ICD-10-CM | POA: Diagnosis not present

## 2018-10-21 DIAGNOSIS — N2581 Secondary hyperparathyroidism of renal origin: Secondary | ICD-10-CM | POA: Diagnosis not present

## 2018-10-21 DIAGNOSIS — E876 Hypokalemia: Secondary | ICD-10-CM | POA: Diagnosis not present

## 2018-10-21 DIAGNOSIS — D509 Iron deficiency anemia, unspecified: Secondary | ICD-10-CM | POA: Diagnosis not present

## 2018-10-24 DIAGNOSIS — E876 Hypokalemia: Secondary | ICD-10-CM | POA: Diagnosis not present

## 2018-10-24 DIAGNOSIS — N2581 Secondary hyperparathyroidism of renal origin: Secondary | ICD-10-CM | POA: Diagnosis not present

## 2018-10-24 DIAGNOSIS — D509 Iron deficiency anemia, unspecified: Secondary | ICD-10-CM | POA: Diagnosis not present

## 2018-10-24 DIAGNOSIS — E1122 Type 2 diabetes mellitus with diabetic chronic kidney disease: Secondary | ICD-10-CM | POA: Diagnosis not present

## 2018-10-24 DIAGNOSIS — M0000 Staphylococcal arthritis, unspecified joint: Secondary | ICD-10-CM | POA: Diagnosis not present

## 2018-10-24 DIAGNOSIS — N186 End stage renal disease: Secondary | ICD-10-CM | POA: Diagnosis not present

## 2018-10-26 DIAGNOSIS — E876 Hypokalemia: Secondary | ICD-10-CM | POA: Diagnosis not present

## 2018-10-26 DIAGNOSIS — Z992 Dependence on renal dialysis: Secondary | ICD-10-CM | POA: Diagnosis not present

## 2018-10-26 DIAGNOSIS — I159 Secondary hypertension, unspecified: Secondary | ICD-10-CM | POA: Diagnosis not present

## 2018-10-26 DIAGNOSIS — N2581 Secondary hyperparathyroidism of renal origin: Secondary | ICD-10-CM | POA: Diagnosis not present

## 2018-10-26 DIAGNOSIS — D631 Anemia in chronic kidney disease: Secondary | ICD-10-CM | POA: Diagnosis not present

## 2018-10-26 DIAGNOSIS — M0000 Staphylococcal arthritis, unspecified joint: Secondary | ICD-10-CM | POA: Diagnosis not present

## 2018-10-26 DIAGNOSIS — N186 End stage renal disease: Secondary | ICD-10-CM | POA: Diagnosis not present

## 2018-10-26 DIAGNOSIS — E1122 Type 2 diabetes mellitus with diabetic chronic kidney disease: Secondary | ICD-10-CM | POA: Diagnosis not present

## 2018-10-26 DIAGNOSIS — D509 Iron deficiency anemia, unspecified: Secondary | ICD-10-CM | POA: Diagnosis not present

## 2018-10-28 DIAGNOSIS — D509 Iron deficiency anemia, unspecified: Secondary | ICD-10-CM | POA: Diagnosis not present

## 2018-10-28 DIAGNOSIS — N2581 Secondary hyperparathyroidism of renal origin: Secondary | ICD-10-CM | POA: Diagnosis not present

## 2018-10-28 DIAGNOSIS — E1122 Type 2 diabetes mellitus with diabetic chronic kidney disease: Secondary | ICD-10-CM | POA: Diagnosis not present

## 2018-10-28 DIAGNOSIS — M0000 Staphylococcal arthritis, unspecified joint: Secondary | ICD-10-CM | POA: Diagnosis not present

## 2018-10-28 DIAGNOSIS — Z992 Dependence on renal dialysis: Secondary | ICD-10-CM | POA: Diagnosis not present

## 2018-10-28 DIAGNOSIS — N186 End stage renal disease: Secondary | ICD-10-CM | POA: Diagnosis not present

## 2018-10-31 DIAGNOSIS — N2581 Secondary hyperparathyroidism of renal origin: Secondary | ICD-10-CM | POA: Diagnosis not present

## 2018-10-31 DIAGNOSIS — N186 End stage renal disease: Secondary | ICD-10-CM | POA: Diagnosis not present

## 2018-10-31 DIAGNOSIS — Z992 Dependence on renal dialysis: Secondary | ICD-10-CM | POA: Diagnosis not present

## 2018-10-31 DIAGNOSIS — D509 Iron deficiency anemia, unspecified: Secondary | ICD-10-CM | POA: Diagnosis not present

## 2018-10-31 DIAGNOSIS — E1122 Type 2 diabetes mellitus with diabetic chronic kidney disease: Secondary | ICD-10-CM | POA: Diagnosis not present

## 2018-10-31 DIAGNOSIS — M0000 Staphylococcal arthritis, unspecified joint: Secondary | ICD-10-CM | POA: Diagnosis not present

## 2018-11-02 DIAGNOSIS — M0000 Staphylococcal arthritis, unspecified joint: Secondary | ICD-10-CM | POA: Diagnosis not present

## 2018-11-02 DIAGNOSIS — Z992 Dependence on renal dialysis: Secondary | ICD-10-CM | POA: Diagnosis not present

## 2018-11-02 DIAGNOSIS — N2581 Secondary hyperparathyroidism of renal origin: Secondary | ICD-10-CM | POA: Diagnosis not present

## 2018-11-02 DIAGNOSIS — D509 Iron deficiency anemia, unspecified: Secondary | ICD-10-CM | POA: Diagnosis not present

## 2018-11-02 DIAGNOSIS — E1122 Type 2 diabetes mellitus with diabetic chronic kidney disease: Secondary | ICD-10-CM | POA: Diagnosis not present

## 2018-11-02 DIAGNOSIS — N186 End stage renal disease: Secondary | ICD-10-CM | POA: Diagnosis not present

## 2018-11-03 ENCOUNTER — Other Ambulatory Visit: Payer: Self-pay | Admitting: *Deleted

## 2018-11-03 NOTE — Patient Outreach (Signed)
[facility]) Care Management  11/03/2018  SHALYN KORAL 1950/12/08 [id number]   RN Health Coach attempted follow up outreach call to patient.  Patient was unavailable.No voicemail pick up.  Pan: RN will call patient again within 14 days.  [place] Care Management [phone]

## 2018-11-04 DIAGNOSIS — N2581 Secondary hyperparathyroidism of renal origin: Secondary | ICD-10-CM | POA: Diagnosis not present

## 2018-11-04 DIAGNOSIS — Z992 Dependence on renal dialysis: Secondary | ICD-10-CM | POA: Diagnosis not present

## 2018-11-04 DIAGNOSIS — N186 End stage renal disease: Secondary | ICD-10-CM | POA: Diagnosis not present

## 2018-11-04 DIAGNOSIS — M0000 Staphylococcal arthritis, unspecified joint: Secondary | ICD-10-CM | POA: Diagnosis not present

## 2018-11-04 DIAGNOSIS — E1122 Type 2 diabetes mellitus with diabetic chronic kidney disease: Secondary | ICD-10-CM | POA: Diagnosis not present

## 2018-11-04 DIAGNOSIS — D509 Iron deficiency anemia, unspecified: Secondary | ICD-10-CM | POA: Diagnosis not present

## 2018-11-07 DIAGNOSIS — M0000 Staphylococcal arthritis, unspecified joint: Secondary | ICD-10-CM | POA: Diagnosis not present

## 2018-11-07 DIAGNOSIS — D509 Iron deficiency anemia, unspecified: Secondary | ICD-10-CM | POA: Diagnosis not present

## 2018-11-07 DIAGNOSIS — N186 End stage renal disease: Secondary | ICD-10-CM | POA: Diagnosis not present

## 2018-11-07 DIAGNOSIS — N2581 Secondary hyperparathyroidism of renal origin: Secondary | ICD-10-CM | POA: Diagnosis not present

## 2018-11-07 DIAGNOSIS — E1122 Type 2 diabetes mellitus with diabetic chronic kidney disease: Secondary | ICD-10-CM | POA: Diagnosis not present

## 2018-11-07 DIAGNOSIS — Z992 Dependence on renal dialysis: Secondary | ICD-10-CM | POA: Diagnosis not present

## 2018-11-08 ENCOUNTER — Other Ambulatory Visit: Payer: Self-pay | Admitting: *Deleted

## 2018-11-08 ENCOUNTER — Other Ambulatory Visit: Payer: Self-pay

## 2018-11-08 DIAGNOSIS — Z992 Dependence on renal dialysis: Secondary | ICD-10-CM

## 2018-11-08 DIAGNOSIS — N186 End stage renal disease: Secondary | ICD-10-CM

## 2018-11-08 NOTE — Patient Outreach (Signed)
[place] [facility]) Care Management  11/08/2018  NIANG MITCHELTREE 1951-02-27 [id number]  Request for pt for a motorized wheelchair  RN spoke with pt today and inquired further on her request for a motorized wheelchair. RN reiterated on this request when pt had [employer] involved last year in November with [place] and [place]. Pt has requested several items to assist on with [facility] involving Kendra Humble (LCSW) who has assisted pt with such requested.  -Pt has a split level home with one entry entering lower level and another entry entering the top level. [facility] has offered to assist with a ramp in the home however pt declined requested a stair-lift. Resources provided however pt did not wish to cover any expenses out of pocket and again declined the ramp. Pt has reported in the past that she lives downstairs most of the time where her husband relocated her bed. Note pt refuse to provide financial information(income)  for the ramp. -----Pt applied for MCD in 2018 with lower income levels. RN has encouraged pt to reapply due to increased amount for MCD 2020. Pt indicated she was short with both her and her spouse by $1.00. Income went from $1040 (2019) to $7793 (2020). RN reminded pt to utilize the Education officer, museum at the dialysis center to assist her with this process if she needs further assistance with faxing this application (pt verbalized an understanding).  -----Note pt's knee surgery was in November as she continue to use the provided DME and reports she drives herself to and from the dialysis center.  -----Concerning the motorized chair pt has been informed and informed once again that she would need to be evaluated for such equipment and her provider would need to provide orders to an agency or evaluate pt directly via application for such DME. Pt is aware that [facility] does not have PT/OT to evaluate for this equipment. Once evaluated it would need to be submitted to her insurance. Pt aware  to completed the MCD application and when would improve her chances for the supplemental income for this equipment. Pt with a better understanding and will pursue her options for making this request to her provider.  No other request at this time as RN has updated team (Kendar Humble,LCSW and [place],RNCM) of the above information.  Raina Mina, RN Care Management Coordinator [place] Office [phone]

## 2018-11-09 DIAGNOSIS — N2581 Secondary hyperparathyroidism of renal origin: Secondary | ICD-10-CM | POA: Diagnosis not present

## 2018-11-09 DIAGNOSIS — N186 End stage renal disease: Secondary | ICD-10-CM | POA: Diagnosis not present

## 2018-11-09 DIAGNOSIS — D509 Iron deficiency anemia, unspecified: Secondary | ICD-10-CM | POA: Diagnosis not present

## 2018-11-09 DIAGNOSIS — E1122 Type 2 diabetes mellitus with diabetic chronic kidney disease: Secondary | ICD-10-CM | POA: Diagnosis not present

## 2018-11-09 DIAGNOSIS — Z992 Dependence on renal dialysis: Secondary | ICD-10-CM | POA: Diagnosis not present

## 2018-11-09 DIAGNOSIS — M0000 Staphylococcal arthritis, unspecified joint: Secondary | ICD-10-CM | POA: Diagnosis not present

## 2018-11-10 ENCOUNTER — Other Ambulatory Visit: Payer: Self-pay | Admitting: *Deleted

## 2018-11-10 ENCOUNTER — Encounter: Payer: Self-pay | Admitting: *Deleted

## 2018-11-10 ENCOUNTER — Ambulatory Visit (INDEPENDENT_AMBULATORY_CARE_PROVIDER_SITE_OTHER): Payer: Medicare Other | Admitting: Family

## 2018-11-10 ENCOUNTER — Encounter: Payer: Self-pay | Admitting: Family

## 2018-11-10 ENCOUNTER — Other Ambulatory Visit: Payer: Self-pay

## 2018-11-10 ENCOUNTER — Ambulatory Visit (HOSPITAL_COMMUNITY)
Admission: RE | Admit: 2018-11-10 | Discharge: 2018-11-10 | Disposition: A | Discharge status: Home / Self Care | Payer: Medicare Other | Source: Ambulatory Visit | Attending: Family | Admitting: Family

## 2018-11-10 VITALS — BP 103/53 | HR 87 | Temp 97.3°F | Resp 14 | Ht 65.0 in | Wt 142.0 lb

## 2018-11-10 DIAGNOSIS — Z89431 Acquired absence of right foot: Secondary | ICD-10-CM

## 2018-11-10 DIAGNOSIS — N186 End stage renal disease: Secondary | ICD-10-CM | POA: Insufficient documentation

## 2018-11-10 DIAGNOSIS — Z992 Dependence on renal dialysis: Secondary | ICD-10-CM

## 2018-11-10 DIAGNOSIS — I779 Disorder of arteries and arterioles, unspecified: Secondary | ICD-10-CM

## 2018-11-10 DIAGNOSIS — T82590A Other mechanical complication of surgically created arteriovenous fistula, initial encounter: Secondary | ICD-10-CM

## 2018-11-10 DIAGNOSIS — Z89432 Acquired absence of left foot: Secondary | ICD-10-CM

## 2018-11-10 DIAGNOSIS — Z95828 Presence of other vascular implants and grafts: Secondary | ICD-10-CM | POA: Diagnosis not present

## 2018-11-10 MED ORDER — CLOPIDOGREL BISULFATE 75 MG PO TABS: 75.0000 mg | ORAL_TABLET | Freq: Every day | ORAL | 11 refills | Status: DC

## 2018-11-10 NOTE — Patient Instructions (Signed)
Before your next abdominal ultrasound:  Avoid gas forming foods and beverages the day before the test.   Take two Extra-Strength Gas-X capsules at bedtime the night before the test. Take another two Extra-Strength Gas-X capsules in the middle of the night if you get up to the restroom, if not, first thing in the morning with water.  Do not chew gum.     
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
## 2018-11-11 DIAGNOSIS — Z992 Dependence on renal dialysis: Secondary | ICD-10-CM | POA: Diagnosis not present

## 2018-11-11 DIAGNOSIS — E1122 Type 2 diabetes mellitus with diabetic chronic kidney disease: Secondary | ICD-10-CM | POA: Diagnosis not present

## 2018-11-11 DIAGNOSIS — N186 End stage renal disease: Secondary | ICD-10-CM | POA: Diagnosis not present

## 2018-11-11 DIAGNOSIS — D509 Iron deficiency anemia, unspecified: Secondary | ICD-10-CM | POA: Diagnosis not present

## 2018-11-11 DIAGNOSIS — N2581 Secondary hyperparathyroidism of renal origin: Secondary | ICD-10-CM | POA: Diagnosis not present

## 2018-11-11 DIAGNOSIS — M0000 Staphylococcal arthritis, unspecified joint: Secondary | ICD-10-CM | POA: Diagnosis not present

## 2018-11-14 ENCOUNTER — Telehealth: Payer: Self-pay | Admitting: Family

## 2018-11-14 DIAGNOSIS — N186 End stage renal disease: Secondary | ICD-10-CM | POA: Diagnosis not present

## 2018-11-14 DIAGNOSIS — Z992 Dependence on renal dialysis: Secondary | ICD-10-CM | POA: Diagnosis not present

## 2018-11-14 DIAGNOSIS — N2581 Secondary hyperparathyroidism of renal origin: Secondary | ICD-10-CM | POA: Diagnosis not present

## 2018-11-14 DIAGNOSIS — E1122 Type 2 diabetes mellitus with diabetic chronic kidney disease: Secondary | ICD-10-CM | POA: Diagnosis not present

## 2018-11-14 DIAGNOSIS — D509 Iron deficiency anemia, unspecified: Secondary | ICD-10-CM | POA: Diagnosis not present

## 2018-11-14 DIAGNOSIS — M0000 Staphylococcal arthritis, unspecified joint: Secondary | ICD-10-CM | POA: Diagnosis not present

## 2018-11-14 NOTE — Telephone Encounter (Signed)
I advised pt not to take any more Plavix due to her history of recurrent brain bleed. She voiced understanding. She states that she feels well.

## 2018-11-14 NOTE — Telephone Encounter (Signed)
Agree with you. She is very high risk for  bleed with her medical comorbidity especially renal dysfunction and uncontrolled hypertension

## 2018-11-15 DIAGNOSIS — H35373 Puckering of macula, bilateral: Secondary | ICD-10-CM | POA: Diagnosis not present

## 2018-11-15 DIAGNOSIS — H3582 Retinal ischemia: Secondary | ICD-10-CM | POA: Diagnosis not present

## 2018-11-15 DIAGNOSIS — E113531 Type 2 diabetes mellitus with proliferative diabetic retinopathy with traction retinal detachment not involving the macula, right eye: Secondary | ICD-10-CM | POA: Diagnosis not present

## 2018-11-15 DIAGNOSIS — E113511 Type 2 diabetes mellitus with proliferative diabetic retinopathy with macular edema, right eye: Secondary | ICD-10-CM | POA: Diagnosis not present

## 2018-11-15 DIAGNOSIS — E113522 Type 2 diabetes mellitus with proliferative diabetic retinopathy with traction retinal detachment involving the macula, left eye: Secondary | ICD-10-CM | POA: Diagnosis not present

## 2018-11-15 DIAGNOSIS — H4312 Vitreous hemorrhage, left eye: Secondary | ICD-10-CM | POA: Diagnosis not present

## 2018-11-15 DIAGNOSIS — H3563 Retinal hemorrhage, bilateral: Secondary | ICD-10-CM | POA: Diagnosis not present

## 2018-11-15 DIAGNOSIS — E113512 Type 2 diabetes mellitus with proliferative diabetic retinopathy with macular edema, left eye: Secondary | ICD-10-CM | POA: Diagnosis not present

## 2018-11-16 DIAGNOSIS — D509 Iron deficiency anemia, unspecified: Secondary | ICD-10-CM | POA: Diagnosis not present

## 2018-11-16 DIAGNOSIS — N186 End stage renal disease: Secondary | ICD-10-CM | POA: Diagnosis not present

## 2018-11-16 DIAGNOSIS — M0000 Staphylococcal arthritis, unspecified joint: Secondary | ICD-10-CM | POA: Diagnosis not present

## 2018-11-16 DIAGNOSIS — E1122 Type 2 diabetes mellitus with diabetic chronic kidney disease: Secondary | ICD-10-CM | POA: Diagnosis not present

## 2018-11-16 DIAGNOSIS — N2581 Secondary hyperparathyroidism of renal origin: Secondary | ICD-10-CM | POA: Diagnosis not present

## 2018-11-16 DIAGNOSIS — Z992 Dependence on renal dialysis: Secondary | ICD-10-CM | POA: Diagnosis not present

## 2018-11-17 ENCOUNTER — Encounter (HOSPITAL_COMMUNITY): Payer: Self-pay | Admitting: Vascular Surgery

## 2018-11-17 ENCOUNTER — Ambulatory Visit (HOSPITAL_COMMUNITY)
Admission: RE | Admit: 2018-11-17 | Discharge: 2018-11-17 | Disposition: A | Discharge status: Home / Self Care | Payer: Medicare Other | Attending: Vascular Surgery | Admitting: Vascular Surgery

## 2018-11-17 ENCOUNTER — Ambulatory Visit (HOSPITAL_COMMUNITY)
Admission: RE | Disposition: A | Discharge status: Home / Self Care | Payer: Self-pay | Source: Home / Self Care | Attending: Vascular Surgery

## 2018-11-17 ENCOUNTER — Other Ambulatory Visit: Payer: Self-pay

## 2018-11-17 DIAGNOSIS — Z992 Dependence on renal dialysis: Secondary | ICD-10-CM | POA: Insufficient documentation

## 2018-11-17 DIAGNOSIS — N186 End stage renal disease: Secondary | ICD-10-CM | POA: Diagnosis not present

## 2018-11-17 DIAGNOSIS — Z87891 Personal history of nicotine dependence: Secondary | ICD-10-CM | POA: Insufficient documentation

## 2018-11-17 DIAGNOSIS — M810 Age-related osteoporosis without current pathological fracture: Secondary | ICD-10-CM | POA: Diagnosis not present

## 2018-11-17 DIAGNOSIS — Z79899 Other long term (current) drug therapy: Secondary | ICD-10-CM | POA: Diagnosis not present

## 2018-11-17 DIAGNOSIS — I132 Hypertensive heart and chronic kidney disease with heart failure and with stage 5 chronic kidney disease, or end stage renal disease: Secondary | ICD-10-CM | POA: Diagnosis not present

## 2018-11-17 DIAGNOSIS — K219 Gastro-esophageal reflux disease without esophagitis: Secondary | ICD-10-CM | POA: Insufficient documentation

## 2018-11-17 DIAGNOSIS — E785 Hyperlipidemia, unspecified: Secondary | ICD-10-CM | POA: Diagnosis not present

## 2018-11-17 DIAGNOSIS — M1990 Unspecified osteoarthritis, unspecified site: Secondary | ICD-10-CM | POA: Insufficient documentation

## 2018-11-17 DIAGNOSIS — E1151 Type 2 diabetes mellitus with diabetic peripheral angiopathy without gangrene: Secondary | ICD-10-CM | POA: Diagnosis not present

## 2018-11-17 DIAGNOSIS — E1122 Type 2 diabetes mellitus with diabetic chronic kidney disease: Secondary | ICD-10-CM | POA: Insufficient documentation

## 2018-11-17 DIAGNOSIS — F419 Anxiety disorder, unspecified: Secondary | ICD-10-CM | POA: Diagnosis not present

## 2018-11-17 DIAGNOSIS — J45909 Unspecified asthma, uncomplicated: Secondary | ICD-10-CM | POA: Diagnosis not present

## 2018-11-17 DIAGNOSIS — T82510A Breakdown (mechanical) of surgically created arteriovenous fistula, initial encounter: Secondary | ICD-10-CM | POA: Diagnosis not present

## 2018-11-17 DIAGNOSIS — E1140 Type 2 diabetes mellitus with diabetic neuropathy, unspecified: Secondary | ICD-10-CM | POA: Diagnosis not present

## 2018-11-17 DIAGNOSIS — Z794 Long term (current) use of insulin: Secondary | ICD-10-CM | POA: Diagnosis not present

## 2018-11-17 DIAGNOSIS — G4730 Sleep apnea, unspecified: Secondary | ICD-10-CM | POA: Diagnosis not present

## 2018-11-17 DIAGNOSIS — Y841 Kidney dialysis as the cause of abnormal reaction of the patient, or of later complication, without mention of misadventure at the time of the procedure: Secondary | ICD-10-CM | POA: Diagnosis not present

## 2018-11-17 DIAGNOSIS — I509 Heart failure, unspecified: Secondary | ICD-10-CM | POA: Insufficient documentation

## 2018-11-17 DIAGNOSIS — T82898A Other specified complication of vascular prosthetic devices, implants and grafts, initial encounter: Secondary | ICD-10-CM | POA: Diagnosis not present

## 2018-11-17 HISTORY — PX: A/V SHUNTOGRAM: CATH118297

## 2018-11-17 LAB — POCT I-STAT 4, (NA,K, GLUC, HGB,HCT)
Glucose, Bld: 72 mg/dL (ref 70–99)
HCT: 36 % (ref 36.0–46.0)
Hemoglobin: 12.2 g/dL (ref 12.0–15.0)
Potassium: 4.4 mmol/L (ref 3.5–5.1)
Sodium: 134 mmol/L — ABNORMAL LOW (ref 135–145)

## 2018-11-17 LAB — GLUCOSE, CAPILLARY
Glucose-Capillary: 68 mg/dL — ABNORMAL LOW (ref 70–99)
Glucose-Capillary: 119 mg/dL — ABNORMAL HIGH (ref 70–99)

## 2018-11-17 SURGERY — A/V SHUNTOGRAM
Anesthesia: LOCAL | Laterality: Left

## 2018-11-17 MED ORDER — MIDAZOLAM HCL 2 MG/2ML IJ SOLN
INTRAMUSCULAR | Status: DC | PRN
  Administered 2018-11-17: 1 mg via INTRAVENOUS

## 2018-11-17 MED ORDER — FENTANYL CITRATE (PF) 100 MCG/2ML IJ SOLN
INTRAMUSCULAR | Status: AC
  Filled 2018-11-17: qty 2

## 2018-11-17 MED ORDER — LIDOCAINE HCL (PF) 1 % IJ SOLN
INTRAMUSCULAR | Status: AC
  Filled 2018-11-17: qty 30

## 2018-11-17 MED ORDER — IODIXANOL 320 MG/ML IV SOLN
INTRAVENOUS | Status: DC | PRN
  Administered 2018-11-17: 40 mL via INTRA_ARTERIAL

## 2018-11-17 MED ORDER — LIDOCAINE HCL (PF) 1 % IJ SOLN
INTRAMUSCULAR | Status: DC | PRN
  Administered 2018-11-17 (×2): 2 mL via INTRADERMAL

## 2018-11-17 MED ORDER — HEPARIN (PORCINE) IN NACL 1000-0.9 UT/500ML-% IV SOLN
INTRAVENOUS | Status: AC
  Filled 2018-11-17: qty 500

## 2018-11-17 MED ORDER — FENTANYL CITRATE (PF) 100 MCG/2ML IJ SOLN
INTRAMUSCULAR | Status: DC | PRN
  Administered 2018-11-17: 25 ug via INTRAVENOUS

## 2018-11-17 MED ORDER — DEXTROSE 50 % IV SOLN
INTRAVENOUS | Status: AC
  Filled 2018-11-17: qty 50

## 2018-11-17 MED ORDER — MIDAZOLAM HCL 2 MG/2ML IJ SOLN
INTRAMUSCULAR | Status: AC
  Filled 2018-11-17: qty 2

## 2018-11-17 MED ORDER — SODIUM CHLORIDE 0.9% FLUSH: 3.0000 mL | INTRAVENOUS | Status: DC | PRN

## 2018-11-17 MED ORDER — DEXTROSE 50 % IV SOLN
25.0000 mL | Freq: Once | INTRAVENOUS | Status: AC
  Administered 2018-11-17: 25 mL via INTRAVENOUS

## 2018-11-17 MED ORDER — SODIUM CHLORIDE 0.9 % IV SOLN
INTRAVENOUS | Status: AC | PRN
  Administered 2018-11-17: 10 mL/h via INTRAVENOUS

## 2018-11-17 SURGICAL SUPPLY — 9 items
BAG SNAP BAND KOVER 36X36 (MISCELLANEOUS) ×2 IMPLANT
COVER DOME SNAP 22 D (MISCELLANEOUS) ×2 IMPLANT
KIT MICROPUNCTURE NIT STIFF (SHEATH) ×2 IMPLANT
PROTECTION STATION PRESSURIZED (MISCELLANEOUS) ×2
SHEATH PROBE COVER 6X72 (BAG) ×2 IMPLANT
STATION PROTECTION PRESSURIZED (MISCELLANEOUS) ×1 IMPLANT
STOPCOCK MORSE 400PSI 3WAY (MISCELLANEOUS) ×2 IMPLANT
TRAY PV CATH (CUSTOM PROCEDURE TRAY) ×2 IMPLANT
TUBING CIL FLEX 10 FLL-RA (TUBING) ×2 IMPLANT

## 2018-11-17 NOTE — Op Note (Signed)
   PATIENT: Destiny Day      MRN: [id number] DOB: July 11, 1951    DATE OF PROCEDURE: 11/17/2018  INDICATIONS:    Destiny Day is a 68 y.o. female who has had some bleeding issues after dialysis and poor flows in her graft.  She was set up for a fistulogram.  PROCEDURE:    Conscious sedation Ultrasound-guided access to left upper arm graft Fistulogram left upper arm graft  SURGEON: Judeth Cornfield. Scot Dock, MD, FACS  ANESTHESIA: Local with sedation  EBL: Minimal  TECHNIQUE: The patient was brought to the peripheral vascular lab and was sedated.  The period of conscious sedation was 29 minutes.  During that time period, I was present face-to-face 100% of the time.  The patient was administered 1 mg of Versed and 25 mcg of fentanyl. The patient's heart rate, blood pressure, and oxygen saturation were monitored by the nurse continuously during the procedure.  The left arm was prepped and draped in the usual sterile fashion.  Under ultrasound guidance I cannulated the fistula after the skin was anesthetized however we did not get a good return from the needle.  Pressure was held for hemostasis and a small suture was placed.  I then cannulated above this area after the skin was anesthetized under 1% lidocaine.  At this level we had a good return.  The wire passed easily.  A micropuncture sheath was introduced over a wire.  Next multiple contrast injections were made through the micropuncture sheath to evaluate the entire graft and central veins.  Next pressure was held on the graft for a reflux shot to demonstrate the arterial anastomosis.  FINDINGS:   1.  No central venous stenosis 2.  The graft is widely patent with no areas of stenosis. 3.  The outflow veins are widely patent with no areas of stenosis or collaterals to suggest stenosis 4.  The arterial anastomosis is widely patent. 5. Of note by duplex the graft did appear somewhat thickened.    [name], MD, FACS  [facility]  DATE OF DICTATION:   11/17/2018

## 2018-11-17 NOTE — Discharge Instructions (Signed)

## 2018-11-17 NOTE — Progress Notes (Signed)
CBG results called to Rennis Harding, Rn new orders noted.

## 2018-11-17 NOTE — Progress Notes (Signed)
Gave d/c instructions to husband [name], via telephone, due to [place] restrictions.  [name] verbalized understanding of instructions

## 2018-11-18 DIAGNOSIS — N2581 Secondary hyperparathyroidism of renal origin: Secondary | ICD-10-CM | POA: Diagnosis not present

## 2018-11-18 DIAGNOSIS — N186 End stage renal disease: Secondary | ICD-10-CM | POA: Diagnosis not present

## 2018-11-18 DIAGNOSIS — E1122 Type 2 diabetes mellitus with diabetic chronic kidney disease: Secondary | ICD-10-CM | POA: Diagnosis not present

## 2018-11-18 DIAGNOSIS — M0000 Staphylococcal arthritis, unspecified joint: Secondary | ICD-10-CM | POA: Diagnosis not present

## 2018-11-18 DIAGNOSIS — D509 Iron deficiency anemia, unspecified: Secondary | ICD-10-CM | POA: Diagnosis not present

## 2018-11-18 DIAGNOSIS — Z992 Dependence on renal dialysis: Secondary | ICD-10-CM | POA: Diagnosis not present

## 2018-11-18 MED FILL — Heparin Sod (Porcine)-NaCl IV Soln 1000 Unit/500ML-0.9%: INTRAVENOUS | Qty: 500 | Status: AC

## 2018-11-21 DIAGNOSIS — M0000 Staphylococcal arthritis, unspecified joint: Secondary | ICD-10-CM | POA: Diagnosis not present

## 2018-11-21 DIAGNOSIS — N186 End stage renal disease: Secondary | ICD-10-CM | POA: Diagnosis not present

## 2018-11-21 DIAGNOSIS — Z992 Dependence on renal dialysis: Secondary | ICD-10-CM | POA: Diagnosis not present

## 2018-11-21 DIAGNOSIS — E1122 Type 2 diabetes mellitus with diabetic chronic kidney disease: Secondary | ICD-10-CM | POA: Diagnosis not present

## 2018-11-21 DIAGNOSIS — N2581 Secondary hyperparathyroidism of renal origin: Secondary | ICD-10-CM | POA: Diagnosis not present

## 2018-11-21 DIAGNOSIS — D509 Iron deficiency anemia, unspecified: Secondary | ICD-10-CM | POA: Diagnosis not present

## 2018-11-23 DIAGNOSIS — E1122 Type 2 diabetes mellitus with diabetic chronic kidney disease: Secondary | ICD-10-CM | POA: Diagnosis not present

## 2018-11-23 DIAGNOSIS — N2581 Secondary hyperparathyroidism of renal origin: Secondary | ICD-10-CM | POA: Diagnosis not present

## 2018-11-23 DIAGNOSIS — D509 Iron deficiency anemia, unspecified: Secondary | ICD-10-CM | POA: Diagnosis not present

## 2018-11-23 DIAGNOSIS — N186 End stage renal disease: Secondary | ICD-10-CM | POA: Diagnosis not present

## 2018-11-23 DIAGNOSIS — Z992 Dependence on renal dialysis: Secondary | ICD-10-CM | POA: Diagnosis not present

## 2018-11-23 DIAGNOSIS — M0000 Staphylococcal arthritis, unspecified joint: Secondary | ICD-10-CM | POA: Diagnosis not present

## 2018-11-24 DIAGNOSIS — E113511 Type 2 diabetes mellitus with proliferative diabetic retinopathy with macular edema, right eye: Secondary | ICD-10-CM | POA: Diagnosis not present

## 2018-11-25 DIAGNOSIS — D509 Iron deficiency anemia, unspecified: Secondary | ICD-10-CM | POA: Diagnosis not present

## 2018-11-25 DIAGNOSIS — M0000 Staphylococcal arthritis, unspecified joint: Secondary | ICD-10-CM | POA: Diagnosis not present

## 2018-11-25 DIAGNOSIS — D631 Anemia in chronic kidney disease: Secondary | ICD-10-CM | POA: Diagnosis not present

## 2018-11-25 DIAGNOSIS — I159 Secondary hypertension, unspecified: Secondary | ICD-10-CM | POA: Diagnosis not present

## 2018-11-25 DIAGNOSIS — N186 End stage renal disease: Secondary | ICD-10-CM | POA: Diagnosis not present

## 2018-11-25 DIAGNOSIS — Z992 Dependence on renal dialysis: Secondary | ICD-10-CM | POA: Diagnosis not present

## 2018-11-25 DIAGNOSIS — N2581 Secondary hyperparathyroidism of renal origin: Secondary | ICD-10-CM | POA: Diagnosis not present

## 2018-11-25 DIAGNOSIS — E876 Hypokalemia: Secondary | ICD-10-CM | POA: Diagnosis not present

## 2018-11-25 DIAGNOSIS — E1122 Type 2 diabetes mellitus with diabetic chronic kidney disease: Secondary | ICD-10-CM | POA: Diagnosis not present

## 2018-11-28 DIAGNOSIS — M0000 Staphylococcal arthritis, unspecified joint: Secondary | ICD-10-CM | POA: Diagnosis not present

## 2018-11-28 DIAGNOSIS — D509 Iron deficiency anemia, unspecified: Secondary | ICD-10-CM | POA: Diagnosis not present

## 2018-11-28 DIAGNOSIS — E1122 Type 2 diabetes mellitus with diabetic chronic kidney disease: Secondary | ICD-10-CM | POA: Diagnosis not present

## 2018-11-28 DIAGNOSIS — Z992 Dependence on renal dialysis: Secondary | ICD-10-CM | POA: Diagnosis not present

## 2018-11-28 DIAGNOSIS — N2581 Secondary hyperparathyroidism of renal origin: Secondary | ICD-10-CM | POA: Diagnosis not present

## 2018-11-28 DIAGNOSIS — N186 End stage renal disease: Secondary | ICD-10-CM | POA: Diagnosis not present

## 2018-11-29 DIAGNOSIS — E113512 Type 2 diabetes mellitus with proliferative diabetic retinopathy with macular edema, left eye: Secondary | ICD-10-CM | POA: Diagnosis not present

## 2018-11-30 DIAGNOSIS — Z992 Dependence on renal dialysis: Secondary | ICD-10-CM | POA: Diagnosis not present

## 2018-11-30 DIAGNOSIS — N186 End stage renal disease: Secondary | ICD-10-CM | POA: Diagnosis not present

## 2018-11-30 DIAGNOSIS — D509 Iron deficiency anemia, unspecified: Secondary | ICD-10-CM | POA: Diagnosis not present

## 2018-11-30 DIAGNOSIS — M0000 Staphylococcal arthritis, unspecified joint: Secondary | ICD-10-CM | POA: Diagnosis not present

## 2018-11-30 DIAGNOSIS — E1122 Type 2 diabetes mellitus with diabetic chronic kidney disease: Secondary | ICD-10-CM | POA: Diagnosis not present

## 2018-11-30 DIAGNOSIS — N2581 Secondary hyperparathyroidism of renal origin: Secondary | ICD-10-CM | POA: Diagnosis not present

## 2018-12-02 DIAGNOSIS — N2581 Secondary hyperparathyroidism of renal origin: Secondary | ICD-10-CM | POA: Diagnosis not present

## 2018-12-02 DIAGNOSIS — E1122 Type 2 diabetes mellitus with diabetic chronic kidney disease: Secondary | ICD-10-CM | POA: Diagnosis not present

## 2018-12-02 DIAGNOSIS — N186 End stage renal disease: Secondary | ICD-10-CM | POA: Diagnosis not present

## 2018-12-02 DIAGNOSIS — Z992 Dependence on renal dialysis: Secondary | ICD-10-CM | POA: Diagnosis not present

## 2018-12-02 DIAGNOSIS — M0000 Staphylococcal arthritis, unspecified joint: Secondary | ICD-10-CM | POA: Diagnosis not present

## 2018-12-02 DIAGNOSIS — D509 Iron deficiency anemia, unspecified: Secondary | ICD-10-CM | POA: Diagnosis not present

## 2018-12-05 DIAGNOSIS — N186 End stage renal disease: Secondary | ICD-10-CM | POA: Diagnosis not present

## 2018-12-05 DIAGNOSIS — Z992 Dependence on renal dialysis: Secondary | ICD-10-CM | POA: Diagnosis not present

## 2018-12-05 DIAGNOSIS — D509 Iron deficiency anemia, unspecified: Secondary | ICD-10-CM | POA: Diagnosis not present

## 2018-12-05 DIAGNOSIS — M0000 Staphylococcal arthritis, unspecified joint: Secondary | ICD-10-CM | POA: Diagnosis not present

## 2018-12-05 DIAGNOSIS — N2581 Secondary hyperparathyroidism of renal origin: Secondary | ICD-10-CM | POA: Diagnosis not present

## 2018-12-05 DIAGNOSIS — E1122 Type 2 diabetes mellitus with diabetic chronic kidney disease: Secondary | ICD-10-CM | POA: Diagnosis not present

## 2018-12-06 ENCOUNTER — Other Ambulatory Visit: Payer: Self-pay

## 2018-12-06 DIAGNOSIS — I5033 Acute on chronic diastolic (congestive) heart failure: Secondary | ICD-10-CM

## 2018-12-06 MED ORDER — ISOSORB DINITRATE-HYDRALAZINE 20-37.5 MG PO TABS: 1.0000 | ORAL_TABLET | Freq: Three times a day (TID) | ORAL | 3 refills | Status: DC

## 2018-12-07 DIAGNOSIS — Z992 Dependence on renal dialysis: Secondary | ICD-10-CM | POA: Diagnosis not present

## 2018-12-07 DIAGNOSIS — N186 End stage renal disease: Secondary | ICD-10-CM | POA: Diagnosis not present

## 2018-12-07 DIAGNOSIS — E1122 Type 2 diabetes mellitus with diabetic chronic kidney disease: Secondary | ICD-10-CM | POA: Diagnosis not present

## 2018-12-07 DIAGNOSIS — M0000 Staphylococcal arthritis, unspecified joint: Secondary | ICD-10-CM | POA: Diagnosis not present

## 2018-12-07 DIAGNOSIS — N2581 Secondary hyperparathyroidism of renal origin: Secondary | ICD-10-CM | POA: Diagnosis not present

## 2018-12-07 DIAGNOSIS — D509 Iron deficiency anemia, unspecified: Secondary | ICD-10-CM | POA: Diagnosis not present

## 2018-12-08 ENCOUNTER — Ambulatory Visit: Payer: Self-pay | Admitting: Cardiology

## 2018-12-09 DIAGNOSIS — N186 End stage renal disease: Secondary | ICD-10-CM | POA: Diagnosis not present

## 2018-12-09 DIAGNOSIS — Z992 Dependence on renal dialysis: Secondary | ICD-10-CM | POA: Diagnosis not present

## 2018-12-09 DIAGNOSIS — D509 Iron deficiency anemia, unspecified: Secondary | ICD-10-CM | POA: Diagnosis not present

## 2018-12-09 DIAGNOSIS — M0000 Staphylococcal arthritis, unspecified joint: Secondary | ICD-10-CM | POA: Diagnosis not present

## 2018-12-09 DIAGNOSIS — E1122 Type 2 diabetes mellitus with diabetic chronic kidney disease: Secondary | ICD-10-CM | POA: Diagnosis not present

## 2018-12-09 DIAGNOSIS — N2581 Secondary hyperparathyroidism of renal origin: Secondary | ICD-10-CM | POA: Diagnosis not present

## 2018-12-12 DIAGNOSIS — Z992 Dependence on renal dialysis: Secondary | ICD-10-CM | POA: Diagnosis not present

## 2018-12-12 DIAGNOSIS — N2581 Secondary hyperparathyroidism of renal origin: Secondary | ICD-10-CM | POA: Diagnosis not present

## 2018-12-12 DIAGNOSIS — M0000 Staphylococcal arthritis, unspecified joint: Secondary | ICD-10-CM | POA: Diagnosis not present

## 2018-12-12 DIAGNOSIS — D509 Iron deficiency anemia, unspecified: Secondary | ICD-10-CM | POA: Diagnosis not present

## 2018-12-12 DIAGNOSIS — N186 End stage renal disease: Secondary | ICD-10-CM | POA: Diagnosis not present

## 2018-12-12 DIAGNOSIS — E1122 Type 2 diabetes mellitus with diabetic chronic kidney disease: Secondary | ICD-10-CM | POA: Diagnosis not present

## 2018-12-13 ENCOUNTER — Other Ambulatory Visit (INDEPENDENT_AMBULATORY_CARE_PROVIDER_SITE_OTHER): Payer: Self-pay | Admitting: Orthopedic Surgery

## 2018-12-14 DIAGNOSIS — M0000 Staphylococcal arthritis, unspecified joint: Secondary | ICD-10-CM | POA: Diagnosis not present

## 2018-12-14 DIAGNOSIS — Z992 Dependence on renal dialysis: Secondary | ICD-10-CM | POA: Diagnosis not present

## 2018-12-14 DIAGNOSIS — E1122 Type 2 diabetes mellitus with diabetic chronic kidney disease: Secondary | ICD-10-CM | POA: Diagnosis not present

## 2018-12-14 DIAGNOSIS — N186 End stage renal disease: Secondary | ICD-10-CM | POA: Diagnosis not present

## 2018-12-14 DIAGNOSIS — N2581 Secondary hyperparathyroidism of renal origin: Secondary | ICD-10-CM | POA: Diagnosis not present

## 2018-12-14 DIAGNOSIS — D509 Iron deficiency anemia, unspecified: Secondary | ICD-10-CM | POA: Diagnosis not present

## 2018-12-14 NOTE — Pre-Procedure Instructions (Signed)
DARIANNA AMY  12/14/2018      [id number] - [name], [place] [address] [place] [address] Phone: [phone] Fax: [phone]  [place], [place] [address] Phone: [phone] Fax: [place], [place] [address], [address] [place] [address] Phone: [phone] Fax: [phone]    Your procedure is scheduled on Dec 20, 2018.  Report to [facility] Admitting at 5:30 A.M.  Call this number if you have problems the morning of surgery:  [phone]   Remember:  Do not eat or drink after midnight.     Take these medicines the morning of surgery with A SIP OF WATER   Albuterol inhaler - if needed  Budesonide (Symbicort) inhaler - if needed  Carvedilol (Coreg)  Valium - if needed  Flonase - if needed  Prilosec  Oxycodone - Acetaminophen  Senokot  Follow your surgeon's instructions on when to stop Aspirin.  If no instructions were given by your surgeon then you will need to call the office to get those instructions.    7 days prior to surgery STOP taking any Aspirin (unless otherwise instructed by your surgeon), Aleve, Naproxen, Ibuprofen, Motrin, Advil, Goody's, BC's, all herbal medications, fish oil, and all vitamins.   WHAT DO I DO ABOUT MY DIABETES MEDICATION?   Marland Kitchen Do not take oral diabetes medicines (pills) the morning of surgery.  . THE NIGHT BEFORE SURGERY, DO NOT TAKE Bedtime dose of Humulin R, Novolin R.  Take 50% of dose (5 Units) of Humulin N, Novolin N.       . THE MORNING OF SURGERY, If your CBG is greater than 220 mg/dL, you may take  of your sliding scale (correction) dose of insulin.  . The day of surgery, do not take other diabetes injectables, including Byetta (exenatide), Bydureon  (exenatide ER), Victoza (liraglutide), or Trulicity (dulaglutide).    How to Manage Your Diabetes Before and After Surgery  Why is it important to control my blood sugar before and after surgery? . Improving blood sugar levels before and after surgery helps healing and can limit problems. . A way of improving blood sugar control is eating a healthy diet by: o  Eating less sugar and carbohydrates o  Increasing activity/exercise o  Talking with your doctor about reaching your blood sugar goals . High blood sugars (greater than 180 mg/dL) can raise your risk of infections and slow your recovery, so you will need to focus on controlling your diabetes during the weeks before surgery. . Make sure that the doctor who takes care of your diabetes knows about your planned surgery including the date and location.  How do I manage my blood sugar before surgery? . Check your blood sugar at least 4 times a day, starting 2 days before surgery, to make sure that the level is not too high or low. o Check your blood sugar the morning of your surgery when you wake up and every 2 hours until you get to the Short Stay unit. . If your blood sugar is less than 70 mg/dL, you will need to treat for low blood sugar: o Do not take insulin. o Treat a low blood sugar (less than 70 mg/dL) with  cup of clear juice (cranberry or apple),  4 glucose tablets, OR glucose gel. o Recheck blood sugar in 15 minutes after treatment (to make sure it is greater than 70 mg/dL). If your blood sugar is not greater than 70 mg/dL on recheck, call [phone] for further instructions. . Report your blood sugar to the short stay nurse when you get to Short Stay.  . If you are admitted to the hospital after surgery: o Your blood sugar will be checked by the staff and you will probably be given insulin after surgery (instead of oral diabetes medicines) to make sure you have good blood sugar levels. o The goal for blood sugar control after  surgery is 80-180 mg/dL.     Do not wear jewelry, make-up or nail polish.  Do not wear lotions, powders, or perfumes, or deodorant.  Do not shave 48 hours prior to surgery.    Do not bring valuables to the hospital.  [facility] is not responsible for any belongings or valuables.   [place]- Preparing For Surgery  Before surgery, you can play an important role. Because skin is not sterile, your skin needs to be as free of germs as possible. You can reduce the number of germs on your skin by washing with CHG (chlorahexidine gluconate) Soap before surgery.  CHG is an antiseptic cleaner which kills germs and bonds with the skin to continue killing germs even after washing.    Oral Hygiene is also important to reduce your risk of infection.  Remember - BRUSH YOUR TEETH THE MORNING OF SURGERY WITH YOUR REGULAR TOOTHPASTE  Please do not use if you have an allergy to CHG or antibacterial soaps. If your skin becomes reddened/irritated stop using the CHG.  Do not shave (including legs and underarms) for at least 48 hours prior to first CHG shower. It is OK to shave your face.  Please follow these instructions carefully.   1. Shower the NIGHT BEFORE SURGERY and the MORNING OF SURGERY with CHG.   2. If you chose to wash your hair, wash your hair first as usual with your normal shampoo.  3. After you shampoo, rinse your hair and body thoroughly to remove the shampoo.  4. Use CHG as you would any other liquid soap. You can apply CHG directly to the skin and wash gently with a scrungie or a clean washcloth.   5. Apply the CHG Soap to your body ONLY FROM THE NECK DOWN.  Do not use on open wounds or open sores. Avoid contact with your eyes, ears, mouth and genitals (private parts). Wash Face and genitals (private parts)  with your normal soap.  6. Wash thoroughly, paying special attention to the area where your surgery will be performed.  7. Thoroughly rinse your body with warm water from the  neck down.  8. DO NOT shower/wash with your normal soap after using and rinsing off the CHG Soap.  9. Pat yourself dry with a CLEAN TOWEL.  10. Wear CLEAN PAJAMAS to bed the night before surgery, wear comfortable clothes the morning of surgery  11. Place CLEAN SHEETS on your bed the night of your first shower and DO NOT SLEEP WITH PETS.   Day of Surgery:  Do not apply any deodorants/lotions.  Please wear clean clothes to the hospital/surgery center.   Remember to brush your teeth WITH YOUR REGULAR TOOTHPASTE.   Contacts, dentures or bridgework may not be worn into surgery.  Leave your suitcase in the car.  After surgery it may be brought to your  room.  For patients admitted to the hospital, discharge time will be determined by your treatment team.  Patients discharged the day of surgery will not be allowed to drive home.

## 2018-12-15 ENCOUNTER — Inpatient Hospital Stay (HOSPITAL_COMMUNITY): Admission: RE | Admit: 2018-12-15 | Payer: Medicare Other | Source: Ambulatory Visit

## 2018-12-15 ENCOUNTER — Encounter (HOSPITAL_COMMUNITY)
Admission: RE | Admit: 2018-12-15 | Discharge: 2018-12-15 | Disposition: A | Discharge status: Home / Self Care | Payer: Medicare Other | Source: Ambulatory Visit | Attending: Ophthalmology | Admitting: Ophthalmology

## 2018-12-15 ENCOUNTER — Other Ambulatory Visit: Payer: Self-pay

## 2018-12-15 ENCOUNTER — Encounter (HOSPITAL_COMMUNITY): Payer: Self-pay

## 2018-12-15 DIAGNOSIS — Z01812 Encounter for preprocedural laboratory examination: Secondary | ICD-10-CM | POA: Diagnosis not present

## 2018-12-15 LAB — BASIC METABOLIC PANEL
Anion gap: 14 (ref 5–15)
BUN: 22 mg/dL (ref 8–23)
CO2: 29 mmol/L (ref 22–32)
Calcium: 7.8 mg/dL — ABNORMAL LOW (ref 8.9–10.3)
Chloride: 92 mmol/L — ABNORMAL LOW (ref 98–111)
Creatinine, Ser: 4.67 mg/dL — ABNORMAL HIGH (ref 0.44–1.00)
GFR calc Af Amer: 10 mL/min — ABNORMAL LOW (ref >60)
GFR calc non Af Amer: 9 mL/min — ABNORMAL LOW (ref >60)
Glucose, Bld: 211 mg/dL — ABNORMAL HIGH (ref 70–99)
Potassium: 4.2 mmol/L (ref 3.5–5.1)
Sodium: 135 mmol/L (ref 135–145)

## 2018-12-15 LAB — CBC
HCT: 38.9 % (ref 36.0–46.0)
Hemoglobin: 12.3 g/dL (ref 12.0–15.0)
MCH: 31.4 pg (ref 26.0–34.0)
MCHC: 31.6 g/dL (ref 30.0–36.0)
MCV: 99.2 fL (ref 80.0–100.0)
Platelets: 260 10*3/uL (ref 150–400)
RBC: 3.92 MIL/uL (ref 3.87–5.11)
RDW: 13.8 % (ref 11.5–15.5)
WBC: 5.9 10*3/uL (ref 4.0–10.5)
nRBC: 0 % (ref 0.0–0.2)

## 2018-12-15 LAB — GLUCOSE, CAPILLARY: Glucose-Capillary: 161 mg/dL — ABNORMAL HIGH (ref 70–99)

## 2018-12-15 NOTE — Progress Notes (Signed)
PCP - Dr. Ardeth Perfect Cardiologist - Dr. Einar Gip  Chest x-ray - 06/18/18 EKG - 06/18/18 Stress Test - n/a ECHO - 01/23/18 Cardiac Cath - n/a  Sleep Study - long time ago CPAP - no, couldn't afford one  Fasting Blood Sugar - 80-130 Checks Blood Sugar __4___ times a day  Blood Thinner Instructions: N/a Aspirin Instructions: pt takes just as a preventive. She will hold it as of today.  Anesthesia review: No  Patient denies shortness of breath, fever, cough and chest pain at PAT appointment   Patient verbalized understanding of instructions that were given to them at the PAT appointment. Patient was also instructed that they will need to review over the PAT instructions again at home before surgery.   Coronavirus Screening  Have you experienced the following symptoms:  Cough  NO Fever (>100.98F) NO Runny nose NO Sore throat NO  Difficulty breathing/shortness of breath  NO  Have you or a family member traveled in the last 14 days and where? NO  Pt will have Covid 19 testing done this afternoon and pt instructed to self quarantine until surgery.      Patient reminded that hospital visitation restrictions are in effect and the importance of the restrictions.

## 2018-12-15 NOTE — Pre-Procedure Instructions (Signed)
Destiny Day  12/15/2018    Your procedure is scheduled on Tuesday, Dec 20, 2018 at 7:30 AM.   Report to [facility] Entrance "A" Admitting Office at 5:30 A.M.   Call this number if you have problems the morning of surgery: [phone]   Questions prior to day of surgery, please call [phone] between 8 & 4 PM.   Remember:  Do not eat or drink after midnight Monday, 12/19/2018     Take these medicines the morning of surgery with A SIP OF WATER: Carvedilol (Coreg), Isosorbide-Hydralazine (Bidil), Diazepam (Valium) - if needed, Guaifenisin (Robitussin) - if needed, Symbicort inhaler - if needed, Albuterol inhaler - if needed (bring this inhaler with you day of surgery)      Follow your surgeon's instructions on when to stop Aspirin.  If no instructions were given by your surgeon then you will need to call the office to get those instructions.    Do not use any NSAIDS (Ibuprofen, Aleve, etc), Aspirin products (BC Powders, Goodys, etc), Herbal medications or Multivitamins prior to surgery.  Monday evening, take 1/2 of your regular dose of Humulin (Novolin) N insulin. You will take 5 units.  Tuesday AM, take 1/2 of your regular dose of Humulin (Novolin) N insulin. You will take 5 units. If your blood sugar is greater than 220, take 1/2 of your sliding scale dose of Humulin (Novolin) R. If 220 or less, do not use the Humulin R insulin.    How to Manage Your Diabetes Before and After Surgery  Why is it important to control my blood sugar before and after surgery? . Improving blood sugar levels before and after surgery helps healing and can limit problems. . A way of improving blood sugar control is eating a healthy diet by: o  Eating less sugar and carbohydrates o  Increasing activity/exercise o  Talking with your doctor about reaching your blood sugar goals . High blood sugars (greater than 180 mg/dL) can raise your risk of infections and slow your recovery, so you will  need to focus on controlling your diabetes during the weeks before surgery. . Make sure that the doctor who takes care of your diabetes knows about your planned surgery including the date and location.  How do I manage my blood sugar before surgery? . Check your blood sugar at least 4 times a day, starting 2 days before surgery, to make sure that the level is not too high or low. o Check your blood sugar the morning of your surgery when you wake up and every 2 hours until you get to the Short Stay unit. . If your blood sugar is less than 70 mg/dL, you will need to treat for low blood sugar: o Do not take insulin. o Treat a low blood sugar (less than 70 mg/dL) with  cup of clear juice (cranberry or apple), 4 glucose tablets, OR glucose gel. o Recheck blood sugar in 15 minutes after treatment (to make sure it is greater than 70 mg/dL). If your blood sugar is not greater than 70 mg/dL on recheck, call [phone] for further instructions. . Report your blood sugar to the short stay nurse when you get to Short Stay.  . If you are admitted to the hospital after surgery: o Your blood sugar will be checked by the staff and you will probably be given insulin after surgery (instead of oral diabetes medicines) to make sure you have good blood sugar levels. o The goal for blood  sugar control after surgery is 80-180 mg/dL.     Do not wear jewelry, make-up or nail polish.  Do not wear lotions, powders, perfumes or deodorant.  Do not shave 48 hours prior to surgery.    Do not bring valuables to the hospital.  [facility] is not responsible for any belongings or valuables.   [place]- Preparing For Surgery  Before surgery, you can play an important role. Because skin is not sterile, your skin needs to be as free of germs as possible. You can reduce the number of germs on your skin by washing with CHG (chlorahexidine gluconate) Soap before surgery.  CHG is an antiseptic cleaner which kills germs and  bonds with the skin to continue killing germs even after washing.    Oral Hygiene is also important to reduce your risk of infection.  Remember - BRUSH YOUR TEETH THE MORNING OF SURGERY WITH YOUR REGULAR TOOTHPASTE  Please do not use if you have an allergy to CHG or antibacterial soaps. If your skin becomes reddened/irritated stop using the CHG.  Do not shave (including legs and underarms) for at least 48 hours prior to first CHG shower. It is OK to shave your face.  Please follow these instructions carefully.   1. Shower the NIGHT BEFORE SURGERY and the MORNING OF SURGERY with CHG.   2. If you chose to wash your hair, wash your hair first as usual with your normal shampoo.  3. After you shampoo, rinse your hair and body thoroughly to remove the shampoo.  4. Use CHG as you would any other liquid soap. You can apply CHG directly to the skin and wash gently with a scrungie or a clean washcloth.   5. Apply the CHG Soap to your body ONLY FROM THE NECK DOWN.  Do not use on open wounds or open sores. Avoid contact with your eyes, ears, mouth and genitals (private parts). Wash Face and genitals (private parts)  with your normal soap.  6. Wash thoroughly, paying special attention to the area where your surgery will be performed.  7. Thoroughly rinse your body with warm water from the neck down.  8. DO NOT shower/wash with your normal soap after using and rinsing off the CHG Soap.  9. Pat yourself dry with a CLEAN TOWEL.  10. Wear CLEAN PAJAMAS to bed the night before surgery, wear comfortable clothes the morning of surgery  11. Place CLEAN SHEETS on your bed the night of your first shower and DO NOT SLEEP WITH PETS.   Day of Surgery:  Do not apply any deodorants/lotions.  Please wear clean clothes to the hospital/surgery center.   Remember to brush your teeth WITH YOUR REGULAR TOOTHPASTE.   Contacts, dentures or bridgework may not be worn into surgery.  Leave your suitcase in the car.   After surgery it may be brought to your room.  For patients admitted to the hospital, discharge time will be determined by your treatment team.  Patients discharged the day of surgery will not be allowed to drive home.

## 2018-12-16 DIAGNOSIS — M0000 Staphylococcal arthritis, unspecified joint: Secondary | ICD-10-CM | POA: Diagnosis not present

## 2018-12-16 DIAGNOSIS — E1122 Type 2 diabetes mellitus with diabetic chronic kidney disease: Secondary | ICD-10-CM | POA: Diagnosis not present

## 2018-12-16 DIAGNOSIS — N2581 Secondary hyperparathyroidism of renal origin: Secondary | ICD-10-CM | POA: Diagnosis not present

## 2018-12-16 DIAGNOSIS — Z992 Dependence on renal dialysis: Secondary | ICD-10-CM | POA: Diagnosis not present

## 2018-12-16 DIAGNOSIS — N186 End stage renal disease: Secondary | ICD-10-CM | POA: Diagnosis not present

## 2018-12-16 DIAGNOSIS — D509 Iron deficiency anemia, unspecified: Secondary | ICD-10-CM | POA: Diagnosis not present

## 2018-12-16 NOTE — Anesthesia Preprocedure Evaluation (Addendum)
Anesthesia Evaluation  Patient identified by MRN, date of birth, ID band Patient awake    Reviewed: Allergy & Precautions, NPO status , Patient's Chart, lab work & pertinent test results  History of Anesthesia Complications (+) PONV and history of anesthetic complications  Airway Mallampati: II  TM Distance: >3 FB Neck ROM: Full    Dental  (+) Dental Advisory Given, Missing, Poor Dentition   Pulmonary asthma , sleep apnea , COPD,  COPD inhaler, former smoker,    breath sounds clear to auscultation       Cardiovascular hypertension, Pt. on medications and Pt. on home beta blockers + Peripheral Vascular Disease and +CHF   Rhythm:Regular Rate:Normal   '19 TTE - severe LVH. EF 55% to 60%. Grade 2 diastolic dysfunction. LA was mildly dilated. PASP was mildly increased. PA peak pressure: 44 mm Hg    Neuro/Psych PSYCHIATRIC DISORDERS Anxiety  Recurrent SDH   Neuromuscular disease (neuropathy)    GI/Hepatic Neg liver ROS, hiatal hernia, GERD  Medicated and Controlled,  Endo/Other  diabetes, Type 2, Insulin Dependent  Renal/GU ESRF and DialysisRenal disease     Musculoskeletal  (+) Arthritis ,   Abdominal   Peds  Hematology negative hematology ROS (+)   Anesthesia Other Findings   Reproductive/Obstetrics                           Anesthesia Physical Anesthesia Plan  ASA: III  Anesthesia Plan: MAC   Post-op Pain Management:    Induction: Intravenous  PONV Risk Score and Plan: 3 and Propofol infusion and Treatment may vary due to age or medical condition  Airway Management Planned: Natural Airway and Nasal Cannula  Additional Equipment: None  Intra-op Plan:   Post-operative Plan:   Informed Consent: I have reviewed the patients History and Physical, chart, labs and discussed the procedure including the risks, benefits and alternatives for the proposed anesthesia with the patient or  authorized representative who has indicated his/her understanding and acceptance.       Plan Discussed with: CRNA and Anesthesiologist  Anesthesia Plan Comments:       Anesthesia Quick Evaluation

## 2018-12-16 NOTE — Progress Notes (Addendum)
Anesthesia Chart Review:  Case:  [id number] Date/Time:  12/20/18 0715   Procedure:  REPAIR OF COMPLEX TRACTION RETINAL DETACHMENT MEMBRANE PILL WITH AIR GAS SILICONE OIL , PHOTOCOAGULATION (Left )   Anesthesia type:  Monitor Anesthesia Care   Pre-op diagnosis:  TYPE TWO DIABETES PROLIFERATIVE  DIABETIC RETINOPATHY, RETINAL DETACHMENT   Location:  [facility] OR ROOM 08 / [facility]   Surgeon:  Jalene Mullet, MD      DISCUSSION: Patient is a 27 year ol female scheduled for the above procedure.  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 She had previously been on Plavix for PAD, but it appears it was recently discontinued given her increased risk for bleed and known recurrent SDH history along with multiple co-morbidities including ESRD and uncontrolled HTN. She developed itching after one of her surgeries prior to 10/2014 (07/10/14 ?). Cardiology notes indicate normal coronaries by 2007 cath.  She was last seen by cardiologist Dr. Einar Gip on 06/09/18. Compliance with antihypertensive encouraged. Six month follow-up recommended. She underwent right knee arthroscopy days later on 06/11/18, but required admission 06/18/18-06/20/18 for acute on chronic diastolic CHF and hypertensive urgency with BP 209/134. CTA was negative for PE. She received emergent hemodialysis.   Blood pressure improved since 05/2018 based on PAT vitals. CBC WNL. Cr 4.67, K 4.2, Ca low at 7.8 (previously 8.1 06/20/18, albumin 2.4 05/07/18), glucose 211. Last A1c seen was 04/2018 (7.2%). Unable  to add A1c to PAT labs since > 24 hours ago. Reported home CBGs or 80-130. She will get a fasting CBG/ISTAT or equivalent and presurgical COVID test on arrival.  If day of surgery labs and vitals acceptable and otherwise no acute changes then I would anticipate that she can proceed as planned.   VS: BP (!) 139/52   Pulse 64   Temp 36.8 C   Resp 18   Ht 5\' 5"  (1.651 m)   Wt 65.6 kg   BMI 24.06 kg/m     PROVIDERS: Velna Hatchet, MD is PCP Adrian Prows, MD is cardiologist. Last visit 06/09/18 for follow-up HTN and carotid US.  Madelon Lips, MD is nephrologist   LABS: Preoperative labs noted. Last A1c seen was 7.2 on 05/06/18 (all labs ordered are listed, but only abnormal results are displayed)  Labs Reviewed  GLUCOSE, CAPILLARY - Abnormal; Notable for the following components:      Result Value   Glucose-Capillary 161 (*)    All other components within normal limits  BASIC METABOLIC PANEL - Abnormal; Notable for the following components:   Chloride 92 (*)    Glucose, Bld 211 (*)    Creatinine, Ser 4.67 (*)    Calcium 7.8 (*)    GFR calc non Af Amer 9 (*)    GFR calc Af Amer 10 (*)    All other components within normal limits  CBC    IMAGES: CTA chest 06/18/18: IMPRESSION: 1. No filling defect is identified in the pulmonary arterial tree to suggest pulmonary embolus. 2. Moderate cardiomegaly with interstitial pulmonary edema and faint perihilar airspace edema. Small bilateral pleural effusions with passive atelectasis. 3. There is abnormal  adenopathy in the chest, for example an AP window lymph node measuring 1.6 cm in diameter. These lymph nodes are somewhat indistinctly marginated, and I would tend to favor passive congestion is the likely cause. Strictly speaking, malignancy such as lymphoma is not excluded. 4. Airway thickening is present, suggesting bronchitis or reactive airways disease. 5. Thoracic spondylosis. 6.  Aortic Atherosclerosis (ICD10-I70.0).   Coronary atherosclerosis.   EKG:  - EKG 06/18/18: SR. Non-specific intraventricular conduction delay. Artifact, particularly in limb leads. There is borderline ST elevation, particularly in V2, but also a degree of baseline wanderer.   - EKG 02/1518 [facility] CV): SR, LAE, LVH with repolarization abnormality, cannot exclude high lateral ischemia, borderline first degree AV block. He felt EKG was not significantly changed fro EKG on 05/09/15.   CV: Carotid US 04/05/18 [facility] CV): Conclusions:  1.  Moderate stenosis of the right external carotid artery (greater than 50% stenosis). 2.  There is evidence of heterogeneous plaque in the bilateral carotid arteries.  No hemodynamically significant stenosis evident.  Compared to study done on 02/2012, no significant change.  Follow-up studies if clinically recommended.  Echo 01/23/18: Study Conclusions - Left ventricle: The cavity size was normal. Wall thickness was   increased in a pattern of severe LVH. Systolic function was   normal. The estimated ejection fraction was in the range of 55%   to 60%. Wall motion was normal; there were no regional wall   motion abnormalities. Features are consistent with a pseudonormal   left ventricular filling pattern, with concomitant abnormal   relaxation and increased filling pressure (grade 2 diastolic   dysfunction). Doppler parameters are consistent with high   ventricular filling pressure. - Mitral valve: Calcified annulus. - Left atrium: The atrium was mildly dilated. - Pulmonary arteries: Systolic pressure was mildly increased. PA   peak pressure: 44 mm Hg (S). Impressions: - Normal LV systolic function; severe LVH; moderate diastolic   dysfunction; mild LAE; mild TR with mild pulmonary hypertension.  According to Dr. Irven Shelling 06/09/18 office note, she had: "Coronary Angiogram 08/10/05: Normal coronary arteries and LVEF. Normal renal arteries."   Past Medical History:  Diagnosis Date  .  Abdominal bruit   . Anemia   . Anxiety   . Arthritis    Osteoarthritis  . Asthma   . Cervical disc disease    "pinced nerve"  . CHF (congestive heart failure) ([place])   . Complication of anesthesia    " after I got home from my last procedure, I started itching."  . Diabetes mellitus    Type II  . Diverticulitis   . ESRD (end stage renal disease) ([place])    dialysis - M/W/F- [place]  . GERD (gastroesophageal reflux disease)    from medications  . GI bleed 03/31/2013  . Head injury 07/2017  . History of hiatal hernia   . Hyperlipidemia   . Hypertension   . Neuropathy    left leg  . Osteoporosis   . Peripheral vascular disease ([place])   . Pneumonia    "very young" and a few years ago  . PONV (postoperative nausea and vomiting)   . Seasonal allergies   . Shortness of breath dyspnea    WIth exertion, when fluid builds  . Sleep apnea    can't afford cpap     Past Surgical History:  Procedure Laterality Date  . A/V SHUNTOGRAM N/A 09/22/2016   Procedure: A/V Shuntogram - left arm;  Surgeon: Serafina Mitchell, MD;  Location: [facility]  CV LAB;  Service: Cardiovascular;  Laterality: N/A;  . A/V SHUNTOGRAM N/A 03/22/2018   Procedure: A/V SHUNTOGRAM - left arm;  Surgeon: Serafina Mitchell, MD;  Location: [place] CV LAB;  Service: Cardiovascular;  Laterality: N/A;  . A/V SHUNTOGRAM Left 11/17/2018   Procedure: A/V SHUNTOGRAM;  Surgeon: Angelia Mould, MD;  Location: [place] CV LAB;  Service: Cardiovascular;  Laterality: Left;  . ABDOMINAL HYSTERECTOMY  1993`  . AMPUTATION Left 09/01/2016   Procedure: LEFT FOOT TRANSMETATARSAL AMPUTATION;  Surgeon: Newt Minion, MD;  Location: [facility];  Service: Orthopedics;  Laterality: Left;  . AMPUTATION Right 08/11/2017   Procedure: RIGHT GREAT TOE AMPUTATION DIGIT;  Surgeon: Rosetta Posner, MD;  Location: [place];  Service: Vascular;  Laterality: Right;  . AMPUTATION Right 12/31/2017   Procedure: RIGHT TRANSMETATARSAL AMPUTATION;  Surgeon: Newt Minion, MD;  Location: [place];  Service: Orthopedics;  Laterality: Right;  . AV FISTULA PLACEMENT Left 04/21/2016   Procedure: INSERTION OF ARTERIOVENOUS (AV) GORE-TEX GRAFT ARM LEFT;  Surgeon: Elam Dutch, MD;  Location: [place];  Service: Vascular;  Laterality: Left;  . [place] TRANSPOSITION Left 07/10/2014   Procedure: BASCILIC VEIN TRANSPOSITION;  Surgeon: Angelia Mould, MD;  Location: [place];  Service: Vascular;  Laterality: Left;  . BASCILIC VEIN TRANSPOSITION Right 11/08/2014   Procedure: FIRST STAGE BASILIC VEIN TRANSPOSITION;  Surgeon: Angelia Mould, MD;  Location: [place];  Service: Vascular;  Laterality: Right;  . BASCILIC VEIN TRANSPOSITION Right 01/18/2015   Procedure: SECOND STAGE BASILIC VEIN TRANSPOSITION;  Surgeon: Angelia Mould, MD;  Location: [place];  Service: Vascular;  Laterality: Right;  . CRANIOTOMY N/A 08/23/2017   Procedure: CRANIOTOMY HEMATOMA EVACUATION SUBDURAL;  Surgeon: Ashok Pall, MD;  Location: [place];  Service: Neurosurgery;  Laterality: N/A;  . CRANIOTOMY Left 08/24/2017   Procedure: CRANIOTOMY FOR RECURRENT ACUTE SUBDURAL HEMATOMA;  Surgeon: Ashok Pall, MD;  Location: [place];  Service: Neurosurgery;  Laterality: Left;  . ESOPHAGOGASTRODUODENOSCOPY N/A 03/31/2013   Procedure: ESOPHAGOGASTRODUODENOSCOPY (EGD);  Surgeon: Gatha Mayer, MD;  Location: [facility] ENDOSCOPY;  Service: Endoscopy;  Laterality: N/A;  . EYE SURGERY     laser surgery  . FEMORAL-POPLITEAL BYPASS GRAFT Left 07/08/2016   Procedure: LEFT  FEMORAL-BELOW KNEE POPLITEAL ARTERY BYPASS GRAFT USING 6MM X 80 CM PROPATEN GORETEX GRAFT WITH RINGS.;  Surgeon: Rosetta Posner, MD;  Location: [place];  Service: Vascular;  Laterality: Left;  . FEMORAL-POPLITEAL BYPASS GRAFT Right 08/11/2017   Procedure: RIGHT FEMORAL TO BELOW KNEE POPLITEAL ARTERKY  BYPASS GRAFT USING 6MM RINGED PROPATEN GRAFT;  Surgeon: Rosetta Posner, MD;  Location: [place];  Service: Vascular;  Laterality: Right;  .  FISTULOGRAM Left 10/29/2014   Procedure: FISTULOGRAM;  Surgeon: Angelia Mould, MD;  Location: [facility] CATH LAB;  Service: Cardiovascular;  Laterality: Left;  . KNEE ARTHROSCOPY Right 05/06/2018   Procedure: RIGHT KNEE ARTHROSCOPY;  Surgeon: Newt Minion, MD;  Location: [place];  Service: Orthopedics;  Laterality: Right;  . KNEE ARTHROSCOPY Right 06/10/2018   Procedure: RIGHT KNEE ARTHROSCOPY AND DEBRIDEMENT;  Surgeon: Newt Minion, MD;  Location: [place];  Service: Orthopedics;  Laterality: Right;  . LIGATION OF ARTERIOVENOUS  FISTULA Left 04/21/2016   Procedure: LIGATION OF ARTERIOVENOUS  FISTULA LEFT ARM;  Surgeon: Elam Dutch, MD;  Location: [place];  Service: Vascular;  Laterality: Left;  . LOWER EXTREMITY ANGIOGRAPHY N/A 04/06/2017   Procedure: Lower Extremity Angiography - Right;  Surgeon: Trula Slade,  Butch [place], MD;  Location: [place] CV LAB;  Service: Cardiovascular;  Laterality: N/A;  . PATCH ANGIOPLASTY Right 01/18/2015   Procedure: BASILIC VEIN PATCH ANGIOPLASTY USING VASCUGUARD PATCH;  Surgeon: Angelia Mould, MD;  Location: [place];  Service: Vascular;  Laterality: Right;  . PERIPHERAL VASCULAR BALLOON ANGIOPLASTY  09/22/2016   Procedure: Peripheral Vascular Balloon Angioplasty;  Surgeon: Serafina Mitchell, MD;  Location: [place] CV LAB;  Service: Cardiovascular;;  Lt. Fistula  . PERIPHERAL VASCULAR BALLOON ANGIOPLASTY Left 03/22/2018   Procedure: PERIPHERAL VASCULAR BALLOON ANGIOPLASTY;  Surgeon: Serafina Mitchell, MD;  Location: [place] CV LAB;  Service: Cardiovascular;  Laterality: Left;  Arm shunt  . PERIPHERAL VASCULAR CATHETERIZATION N/A 06/23/2016   Procedure: Abdominal Aortogram w/Lower Extremity;  Surgeon: Serafina Mitchell, MD;  Location: [place] CV LAB;  Service: Cardiovascular;  Laterality: N/A;  . PERIPHERAL VASCULAR CATHETERIZATION  06/23/2016   Procedure: Peripheral Vascular Intervention;  Surgeon: Serafina Mitchell, MD;  Location: [place] CV LAB;  Service:  Cardiovascular;;  lt common and external illiac artery    MEDICATIONS: . albuterol (PROVENTIL HFA;VENTOLIN HFA) 108 (90 Base) MCG/ACT inhaler  . amLODipine (NORVASC) 10 MG tablet  . aspirin EC 81 MG tablet  . budesonide-formoterol (SYMBICORT) 160-4.5 MCG/ACT inhaler  . carvedilol (COREG) 12.5 MG tablet  . cinacalcet (SENSIPAR) 90 MG tablet  . clopidogrel (PLAVIX) 75 MG tablet  . diazepam (VALIUM) 5 MG tablet  . ferric citrate (AURYXIA) 1 GM 210 MG(Fe) tablet  . fluticasone (FLONASE) 50 MCG/ACT nasal spray  . gabapentin (NEURONTIN) 100 MG capsule  . guaiFENesin (ROBITUSSIN) 100 MG/5ML liquid  . hydrocerin (EUCERIN) CREA  . insulin NPH Human (HUMULIN N,NOVOLIN N) 100 UNIT/ML injection  . insulin regular (NOVOLIN R,HUMULIN R) 100 units/mL injection  . isosorbide-hydrALAZINE (BIDIL) 20-37.5 MG tablet  . lidocaine-prilocaine (EMLA) cream  . loratadine (CLARITIN) 10 MG tablet  . multivitamin (RENA-VIT) TABS tablet  . omeprazole (PRILOSEC) 20 MG capsule  . oxyCODONE-acetaminophen (PERCOCET/ROXICET) 5-325 MG tablet  . senna (SENOKOT) 8.6 MG tablet  . Vitamin D, Ergocalciferol, (DRISDOL) 1.25 MG (50000 UT) CAPS capsule   No current facility-administered medications for this encounter.   She is no longer on Plavix.    Myra Gianotti, PA-C Surgical Short Stay/Anesthesiology [facility] Phone [phone] [facility] Phone [phone] 12/16/2018 12:56 PM

## 2018-12-19 ENCOUNTER — Ambulatory Visit: Payer: Self-pay | Admitting: Ophthalmology

## 2018-12-19 DIAGNOSIS — Z992 Dependence on renal dialysis: Secondary | ICD-10-CM | POA: Diagnosis not present

## 2018-12-19 DIAGNOSIS — M0000 Staphylococcal arthritis, unspecified joint: Secondary | ICD-10-CM | POA: Diagnosis not present

## 2018-12-19 DIAGNOSIS — N186 End stage renal disease: Secondary | ICD-10-CM | POA: Diagnosis not present

## 2018-12-19 DIAGNOSIS — D509 Iron deficiency anemia, unspecified: Secondary | ICD-10-CM | POA: Diagnosis not present

## 2018-12-19 DIAGNOSIS — N2581 Secondary hyperparathyroidism of renal origin: Secondary | ICD-10-CM | POA: Diagnosis not present

## 2018-12-19 DIAGNOSIS — E1122 Type 2 diabetes mellitus with diabetic chronic kidney disease: Secondary | ICD-10-CM | POA: Diagnosis not present

## 2018-12-19 NOTE — H&P (Signed)
  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 Abdominal bruit   . Anemia   . Anxiety   . Arthritis    Osteoarthritis  . Asthma   . Cervical disc disease    "pinced nerve"  . CHF (congestive heart failure) ([place])   . Complication of anesthesia    " after I got home from my last procedure, I started itching."  . Diabetes mellitus    Type II  . Diverticulitis   . ESRD (end stage renal disease) ([place])    dialysis - M/W/F- [place]  . GERD (gastroesophageal reflux disease)    from medications  . GI bleed 03/31/2013  . Head injury 07/2017  . History of hiatal hernia   . Hyperlipidemia   . Hypertension   . Neuropathy    left leg  . Osteoporosis   . Peripheral vascular disease ([place])   . Pneumonia    "very young" and a few years ago  . PONV (postoperative nausea and vomiting)   . Seasonal allergies   . Shortness of breath dyspnea    WIth exertion, when fluid builds  . Sleep apnea    can't afford cpap     Pertinent ocular history:  Proliferative diabetic retinopathy  Pertinent family history:  Family History  Problem Relation Age of Onset  . Other Mother        not sure of cause of death  . Diabetes Father   . Pancreatic cancer Maternal Grandmother   . Colon cancer Neg Hx     General:  Healthy appearing patient in no distress.    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

## 2018-12-19 NOTE — H&P (Deleted)
  The note originally documented on this encounter has been moved the the encounter in which it belongs.  

## 2018-12-20 ENCOUNTER — Other Ambulatory Visit: Payer: Self-pay

## 2018-12-20 ENCOUNTER — Ambulatory Visit (HOSPITAL_COMMUNITY): Payer: Medicare Other | Admitting: Certified Registered Nurse Anesthetist

## 2018-12-20 ENCOUNTER — Ambulatory Visit (HOSPITAL_COMMUNITY): Payer: Medicare Other | Admitting: Vascular Surgery

## 2018-12-20 ENCOUNTER — Ambulatory Visit (HOSPITAL_COMMUNITY)
Admission: RE | Admit: 2018-12-20 | Discharge: 2018-12-20 | Disposition: A | Discharge status: Home / Self Care | Payer: Medicare Other | Attending: Ophthalmology | Admitting: Ophthalmology

## 2018-12-20 ENCOUNTER — Encounter (HOSPITAL_COMMUNITY)
Admission: RE | Disposition: A | Discharge status: Home / Self Care | Payer: Self-pay | Source: Home / Self Care | Attending: Ophthalmology

## 2018-12-20 ENCOUNTER — Encounter (HOSPITAL_COMMUNITY): Payer: Self-pay

## 2018-12-20 DIAGNOSIS — G4730 Sleep apnea, unspecified: Secondary | ICD-10-CM | POA: Diagnosis not present

## 2018-12-20 DIAGNOSIS — Z992 Dependence on renal dialysis: Secondary | ICD-10-CM | POA: Insufficient documentation

## 2018-12-20 DIAGNOSIS — Z1159 Encounter for screening for other viral diseases: Secondary | ICD-10-CM | POA: Diagnosis not present

## 2018-12-20 DIAGNOSIS — Z87891 Personal history of nicotine dependence: Secondary | ICD-10-CM | POA: Insufficient documentation

## 2018-12-20 DIAGNOSIS — I132 Hypertensive heart and chronic kidney disease with heart failure and with stage 5 chronic kidney disease, or end stage renal disease: Secondary | ICD-10-CM | POA: Insufficient documentation

## 2018-12-20 DIAGNOSIS — E113522 Type 2 diabetes mellitus with proliferative diabetic retinopathy with traction retinal detachment involving the macula, left eye: Secondary | ICD-10-CM | POA: Insufficient documentation

## 2018-12-20 DIAGNOSIS — M1990 Unspecified osteoarthritis, unspecified site: Secondary | ICD-10-CM | POA: Diagnosis not present

## 2018-12-20 DIAGNOSIS — J449 Chronic obstructive pulmonary disease, unspecified: Secondary | ICD-10-CM | POA: Insufficient documentation

## 2018-12-20 DIAGNOSIS — E1151 Type 2 diabetes mellitus with diabetic peripheral angiopathy without gangrene: Secondary | ICD-10-CM | POA: Insufficient documentation

## 2018-12-20 DIAGNOSIS — E1122 Type 2 diabetes mellitus with diabetic chronic kidney disease: Secondary | ICD-10-CM | POA: Insufficient documentation

## 2018-12-20 DIAGNOSIS — N186 End stage renal disease: Secondary | ICD-10-CM | POA: Diagnosis not present

## 2018-12-20 DIAGNOSIS — K449 Diaphragmatic hernia without obstruction or gangrene: Secondary | ICD-10-CM | POA: Insufficient documentation

## 2018-12-20 DIAGNOSIS — I509 Heart failure, unspecified: Secondary | ICD-10-CM | POA: Diagnosis not present

## 2018-12-20 DIAGNOSIS — E1142 Type 2 diabetes mellitus with diabetic polyneuropathy: Secondary | ICD-10-CM | POA: Diagnosis not present

## 2018-12-20 DIAGNOSIS — H3322 Serous retinal detachment, left eye: Secondary | ICD-10-CM | POA: Diagnosis not present

## 2018-12-20 DIAGNOSIS — E113592 Type 2 diabetes mellitus with proliferative diabetic retinopathy without macular edema, left eye: Secondary | ICD-10-CM | POA: Diagnosis not present

## 2018-12-20 HISTORY — PX: REPAIR OF COMPLEX TRACTION RETINAL DETACHMENT: SHX6217

## 2018-12-20 HISTORY — PX: MEMBRANE PEEL: SHX5967

## 2018-12-20 HISTORY — PX: GAS/FLUID EXCHANGE: SHX5334

## 2018-12-20 HISTORY — PX: PHOTOCOAGULATION WITH LASER: SHX6027

## 2018-12-20 LAB — POCT I-STAT 4, (NA,K, GLUC, HGB,HCT)
Glucose, Bld: 79 mg/dL (ref 70–99)
HCT: 36 % (ref 36.0–46.0)
Hemoglobin: 12.2 g/dL (ref 12.0–15.0)
Potassium: 3.7 mmol/L (ref 3.5–5.1)
Sodium: 136 mmol/L (ref 135–145)

## 2018-12-20 LAB — SARS CORONAVIRUS 2 BY RT PCR (HOSPITAL ORDER, PERFORMED IN ~~LOC~~ HOSPITAL LAB): SARS Coronavirus 2: NEGATIVE

## 2018-12-20 LAB — GLUCOSE, CAPILLARY: Glucose-Capillary: 72 mg/dL (ref 70–99)

## 2018-12-20 SURGERY — REPAIR, RETINAL DETACHMENT, COMPLEX
Anesthesia: Monitor Anesthesia Care | Site: Eye | Laterality: Left

## 2018-12-20 MED ORDER — CYCLOPENTOLATE HCL 1 % OP SOLN
1.0000 [drp] | OPHTHALMIC | Status: DC | PRN
  Filled 2018-12-20: qty 2

## 2018-12-20 MED ORDER — DEXAMETHASONE SODIUM PHOSPHATE 10 MG/ML IJ SOLN
INTRAMUSCULAR | Status: AC
  Filled 2018-12-20: qty 1

## 2018-12-20 MED ORDER — ONDANSETRON HCL 4 MG/2ML IJ SOLN
INTRAMUSCULAR | Status: DC | PRN
  Administered 2018-12-20: 4 mg via INTRAVENOUS

## 2018-12-20 MED ORDER — TOBRAMYCIN-DEXAMETHASONE 0.3-0.1 % OP OINT
TOPICAL_OINTMENT | OPHTHALMIC | Status: DC | PRN
  Administered 2018-12-20: 1 via OPHTHALMIC

## 2018-12-20 MED ORDER — BSS IO SOLN
INTRAOCULAR | Status: DC | PRN
  Administered 2018-12-20: 15 mL

## 2018-12-20 MED ORDER — MIDAZOLAM HCL 2 MG/2ML IJ SOLN
INTRAMUSCULAR | Status: AC
  Filled 2018-12-20: qty 2

## 2018-12-20 MED ORDER — TOBRAMYCIN-DEXAMETHASONE 0.3-0.1 % OP OINT
TOPICAL_OINTMENT | OPHTHALMIC | Status: AC
  Filled 2018-12-20: qty 3.5

## 2018-12-20 MED ORDER — TRIAMCINOLONE ACETONIDE 40 MG/ML IJ SUSP
INTRAMUSCULAR | Status: AC
  Filled 2018-12-20: qty 5

## 2018-12-20 MED ORDER — MIDAZOLAM HCL 2 MG/2ML IJ SOLN
INTRAMUSCULAR | Status: DC | PRN
  Administered 2018-12-20: 2 mg via INTRAVENOUS

## 2018-12-20 MED ORDER — TRIAMCINOLONE ACETONIDE 40 MG/ML IJ SUSP
INTRAMUSCULAR | Status: DC | PRN
  Administered 2018-12-20: 40 mg

## 2018-12-20 MED ORDER — OXYCODONE HCL 5 MG/5ML PO SOLN: 5.0000 mg | Freq: Once | ORAL | Status: DC | PRN

## 2018-12-20 MED ORDER — OFLOXACIN 0.3 % OP SOLN
1.0000 [drp] | OPHTHALMIC | Status: AC | PRN
  Administered 2018-12-20 (×3): 1 [drp] via OPHTHALMIC

## 2018-12-20 MED ORDER — OXYCODONE HCL 5 MG PO TABS: 5.0000 mg | ORAL_TABLET | Freq: Once | ORAL | Status: DC | PRN

## 2018-12-20 MED ORDER — PHENYLEPHRINE HCL-NACL 10-0.9 MG/250ML-% IV SOLN
INTRAVENOUS | Status: AC
  Filled 2018-12-20: qty 250

## 2018-12-20 MED ORDER — LIDOCAINE HCL 2 % IJ SOLN
INTRAMUSCULAR | Status: DC | PRN
  Administered 2018-12-20: 6 mL via OPHTHALMIC

## 2018-12-20 MED ORDER — PROPOFOL 10 MG/ML IV BOLUS
INTRAVENOUS | Status: DC | PRN
  Administered 2018-12-20: 30 mg via INTRAVENOUS

## 2018-12-20 MED ORDER — DEXAMETHASONE SODIUM PHOSPHATE 10 MG/ML IJ SOLN
INTRAMUSCULAR | Status: DC | PRN
  Administered 2018-12-20: 10 mg

## 2018-12-20 MED ORDER — ONDANSETRON HCL 4 MG/2ML IJ SOLN
INTRAMUSCULAR | Status: AC
  Filled 2018-12-20: qty 2

## 2018-12-20 MED ORDER — FENTANYL CITRATE (PF) 100 MCG/2ML IJ SOLN: 25.0000 ug | INTRAMUSCULAR | Status: DC | PRN

## 2018-12-20 MED ORDER — CYCLOPENTOLATE HCL 1 % OP SOLN
1.0000 [drp] | OPHTHALMIC | Status: AC | PRN
  Administered 2018-12-20 (×3): 1 [drp] via OPHTHALMIC

## 2018-12-20 MED ORDER — CEFAZOLIN SUBCONJUNCTIVAL INJECTION 100 MG/0.5 ML
200.0000 mg | INJECTION | SUBCONJUNCTIVAL | Status: AC
  Administered 2018-12-20: 100 mg via SUBCONJUNCTIVAL
  Filled 2018-12-20: qty 2

## 2018-12-20 MED ORDER — PROPOFOL 10 MG/ML IV BOLUS
INTRAVENOUS | Status: AC
  Filled 2018-12-20: qty 20

## 2018-12-20 MED ORDER — SODIUM CHLORIDE 0.9 % IV SOLN
INTRAVENOUS | Status: DC | PRN
  Administered 2018-12-20: 07:00:00 via INTRAVENOUS

## 2018-12-20 MED ORDER — EPINEPHRINE PF 1 MG/ML IJ SOLN
INTRAMUSCULAR | Status: AC
  Filled 2018-12-20: qty 1

## 2018-12-20 MED ORDER — OFLOXACIN 0.3 % OP SOLN
1.0000 [drp] | OPHTHALMIC | Status: DC | PRN
  Filled 2018-12-20: qty 5

## 2018-12-20 MED ORDER — TETRACAINE HCL 0.5 % OP SOLN
OPHTHALMIC | Status: AC
  Filled 2018-12-20: qty 4

## 2018-12-20 MED ORDER — PHENYLEPHRINE HCL 2.5 % OP SOLN
1.0000 [drp] | OPHTHALMIC | Status: AC | PRN
  Administered 2018-12-20 (×3): 1 [drp] via OPHTHALMIC

## 2018-12-20 MED ORDER — ONDANSETRON HCL 4 MG/2ML IJ SOLN: 4.0000 mg | Freq: Once | INTRAMUSCULAR | Status: DC | PRN

## 2018-12-20 MED ORDER — PROPARACAINE HCL 0.5 % OP SOLN
1.0000 [drp] | OPHTHALMIC | Status: DC | PRN
  Filled 2018-12-20: qty 15

## 2018-12-20 MED ORDER — HYPROMELLOSE (GONIOSCOPIC) 2.5 % OP SOLN
OPHTHALMIC | Status: AC
  Filled 2018-12-20: qty 15

## 2018-12-20 MED ORDER — PROPARACAINE HCL 0.5 % OP SOLN
1.0000 [drp] | OPHTHALMIC | Status: AC | PRN
  Administered 2018-12-20 (×3): 1 [drp] via OPHTHALMIC

## 2018-12-20 MED ORDER — EPINEPHRINE PF 1 MG/ML IJ SOLN
INTRAOCULAR | Status: DC | PRN
  Administered 2018-12-20: 08:00:00

## 2018-12-20 MED ORDER — HYALURONIDASE HUMAN 150 UNIT/ML IJ SOLN
INTRAMUSCULAR | Status: AC
  Filled 2018-12-20: qty 1

## 2018-12-20 MED ORDER — PHENYLEPHRINE HCL 2.5 % OP SOLN
1.0000 [drp] | OPHTHALMIC | Status: DC | PRN
  Filled 2018-12-20: qty 2

## 2018-12-20 MED ORDER — BUPIVACAINE HCL (PF) 0.75 % IJ SOLN
INTRAMUSCULAR | Status: AC
  Filled 2018-12-20: qty 10

## 2018-12-20 MED ORDER — ATROPINE SULFATE 1 % OP SOLN
OPHTHALMIC | Status: AC
  Filled 2018-12-20: qty 5

## 2018-12-20 MED ORDER — BSS IO SOLN
INTRAOCULAR | Status: AC
  Filled 2018-12-20: qty 15

## 2018-12-20 MED ORDER — FENTANYL CITRATE (PF) 250 MCG/5ML IJ SOLN
INTRAMUSCULAR | Status: AC
  Filled 2018-12-20: qty 5

## 2018-12-20 MED ORDER — LIDOCAINE HCL (PF) 2 % IJ SOLN
INTRAMUSCULAR | Status: AC
  Filled 2018-12-20: qty 10

## 2018-12-20 MED ORDER — BSS PLUS IO SOLN
INTRAOCULAR | Status: AC
  Filled 2018-12-20: qty 500

## 2018-12-20 SURGICAL SUPPLY — 60 items
APPLICATOR COTTON TIP 6IN STRL (MISCELLANEOUS) ×2 IMPLANT
APPLICATOR DR MATTHEWS STRL (MISCELLANEOUS) ×2 IMPLANT
BLADE MINI 60D BLUE (BLADE) ×2 IMPLANT
BLADE MINI RND TIP GREEN BEAV (BLADE) IMPLANT
CANNULA ANT CHAM MAIN (OPHTHALMIC RELATED) IMPLANT
CANNULA DUALBORE 25G (CANNULA) ×2 IMPLANT
CANNULA VLV SOFT TIP 25G (OPHTHALMIC) IMPLANT
CANNULA VLV SOFT TIP 25GA (OPHTHALMIC) ×2 IMPLANT
CAUTERY EYE LOW TEMP 1300F FIN (OPHTHALMIC RELATED) IMPLANT
CLSR STERI-STRIP ANTIMIC 1/2X4 (GAUZE/BANDAGES/DRESSINGS) ×1 IMPLANT
CORDS BIPOLAR (ELECTRODE) IMPLANT
COVER SURGICAL LIGHT HANDLE (MISCELLANEOUS) ×2 IMPLANT
COVER WAND RF STERILE (DRAPES) ×2 IMPLANT
FILTER BLUE MILLIPORE (MISCELLANEOUS) IMPLANT
FILTER STRAW FLUID ASPIR (MISCELLANEOUS) IMPLANT
FORCEPS GRIESHABER ILM 25G A (INSTRUMENTS) ×1 IMPLANT
FORCEPS GRIESHABER MAX 25G (MISCELLANEOUS) ×1 IMPLANT
GAS AUTO FILL CONSTEL (OPHTHALMIC) ×2
GAS AUTO FILL CONSTELLATION (OPHTHALMIC) IMPLANT
GAS OPHTHALMIC (MISCELLANEOUS) IMPLANT
GLOVE ECLIPSE 7.5 STRL STRAW (GLOVE) ×2 IMPLANT
GOWN STRL REUS W/ TWL LRG LVL3 (GOWN DISPOSABLE) ×2 IMPLANT
GOWN STRL REUS W/TWL LRG LVL3 (GOWN DISPOSABLE) ×2
KIT BASIN OR (CUSTOM PROCEDURE TRAY) ×2 IMPLANT
KIT PERFLUORON PROCEDURE 5ML (MISCELLANEOUS) IMPLANT
KIT TURNOVER KIT B (KITS) ×2 IMPLANT
LENS BIOM SUPER VIEW SET DISP (OPHTHALMIC RELATED) ×1 IMPLANT
NDL 18GX1X1/2 (RX/OR ONLY) (NEEDLE) ×1 IMPLANT
NDL HYPO 25GX1X1/2 BEV (NEEDLE) IMPLANT
NDL HYPO 30X.5 LL (NEEDLE) ×2 IMPLANT
NEEDLE 18GX1X1/2 (RX/OR ONLY) (NEEDLE) ×2 IMPLANT
NEEDLE HYPO 25GX1X1/2 BEV (NEEDLE) IMPLANT
NEEDLE HYPO 30X.5 LL (NEEDLE) ×4 IMPLANT
NS IRRIG 1000ML POUR BTL (IV SOLUTION) ×2 IMPLANT
PACK VITRECTOMY CUSTOM (CUSTOM PROCEDURE TRAY) ×2 IMPLANT
PAD ARMBOARD 7.5X6 YLW CONV (MISCELLANEOUS) ×4 IMPLANT
PAK VITRECTOMY PIK 25 GA (OPHTHALMIC RELATED) IMPLANT
PIC ILLUMINATED 25G (OPHTHALMIC) ×2
PIK ILLUMINATED 25G (OPHTHALMIC) IMPLANT
PROBE CONSTELLATION 25 GA PLUS (OPHTHALMIC) ×1 IMPLANT
PROBE LASER ILLUM FLEX CVD 25G (OPHTHALMIC) ×1 IMPLANT
REPL STRA BRUSH NDL (NEEDLE) IMPLANT
REPL STRA BRUSH NEEDLE (NEEDLE) IMPLANT
RESERVOIR BACK FLUSH (MISCELLANEOUS) IMPLANT
RETRACTOR IRIS FLEX 25G GRIESH (INSTRUMENTS) IMPLANT
ROLLS DENTAL (MISCELLANEOUS) IMPLANT
SCRAPER DIAMOND 25GA (OPHTHALMIC RELATED) ×1 IMPLANT
SET FLUID INJECTOR (SET/KITS/TRAYS/PACK) IMPLANT
SHEET MEDIUM DRAPE 40X70 STRL (DRAPES) ×2 IMPLANT
SOLUTION ANTI FOG 6CC (MISCELLANEOUS) ×3 IMPLANT
STOCKINETTE IMPERVIOUS 9X36 MD (GAUZE/BANDAGES/DRESSINGS) ×4 IMPLANT
STOPCOCK 4 WAY LG BORE MALE ST (IV SETS) IMPLANT
SUT ETHILON 5.0 S-24 (SUTURE) ×2 IMPLANT
SUT SILK 2 0 (SUTURE) ×1
SUT SILK 2-0 18XBRD TIE 12 (SUTURE) ×1 IMPLANT
SUT VICRYL 7 0 TG140 8 (SUTURE) IMPLANT
SYR 20CC LL (SYRINGE) IMPLANT
TOWEL OR 17X24 6PK STRL BLUE (TOWEL DISPOSABLE) ×4 IMPLANT
WATER STERILE IRR 1000ML POUR (IV SOLUTION) ×2 IMPLANT
WIPE INSTRUMENT VISIWIPE 73X73 (MISCELLANEOUS) IMPLANT

## 2018-12-20 NOTE — Discharge Instructions (Signed)
DO NOT SLEEP ON BACK, THE EYE PRESSURE CAN GO UP AND CAUSE VISION LOSS   SLEEP ON SIDE WITH NOSE TO PILLOW  DURING DAY KEEP FACE DOWN.  15 MINUTES EVERY 2 HOURS IT IS OK TO LOOK STRAIGHT AHEAD (USE BATHROOM, EAT, WALK, ETC.)  

## 2018-12-20 NOTE — Transfer of Care (Signed)
Immediate Anesthesia Transfer of Care Note  Patient: Destiny Day  Procedure(s) Performed: REPAIR OF COMPLEX TRACTION RETINAL DETACHMENT (Left Eye) MEMBRANE PEEL (Left Eye) PHOTOCOAGULATION WITH LASER (Left Eye) AIR GAS EXCHANGE (Left Eye)  Patient Location: PACU  Anesthesia Type:MAC  Level of Consciousness: awake, alert  and oriented  Airway & Oxygen Therapy: Patient Spontanous Breathing and Patient connected to face mask oxygen  Post-op Assessment: Report given to RN and Post -op Vital signs reviewed and stable  Post vital signs: Reviewed and stable  Last Vitals:  Vitals Value Taken Time  BP 115/53   Temp    Pulse 72   Resp 12 12/20/2018  9:26 AM  SpO2 100   Vitals shown include unvalidated device data.  Last Pain:  Vitals:   12/20/18 0630  TempSrc:   PainSc: 0-No pain         Complications: No apparent anesthesia complications

## 2018-12-20 NOTE — Op Note (Signed)
Destiny Day 12/20/2018 Diagnosis: TYPE TWO DIABETES PROLIFERATIVE  DIABETIC RETINOPATHY, TRACTIONAL RETINAL DETACHMENT INVOLVING MACULA  Procedure: [place] Vitrectomy, Membrane Peeling, Endolaser and Membranectomy with C3F8 gas tamponade Operative Eye:  left eye  Surgeon: Royston Cowper Estimated Blood Loss: minimal Specimens for Pathology:  None Complications: none   The  patient was prepped and draped in the usual fashion for ocular surgery on the  left eye .  A lid speculum was placed.  Infusion line and trocar was placed at the 4 o'clock position approximately 3.5 mm from the surgical limbus.   The infusion line was allowed to run and then clamped when placed at the cannula opening. The line was inserted and secured to the drape with an adhesive strip.   Active trocars/cannula were placed at the 10 and 2 o'clock positions approximately 3.5 mm from the surgical limbus. The cannula was visualized in the vitreous cavity.  The light pipe and vitreous cutter were inserted into the vitreous cavity and a core vitrectomy was performed.  Care was taken to limit traction on the macula.  Attention was directed toward relieving the tractional detachment from the posterior pole in particular peripherally (nasally and superiorly). This was done carefully at the disc and surrounding arcades. There was notable neovascular fronds with traction and associated hemorrhage supereriorly. Care was taken to elevate the membranes and remove them both with a vitrector and a lighted pik with dissection. ILM forceps were used to elevate the membrane and the lighted pik and vitrector were used to remove the membranes and relieve traction on the macule.  Hemostasis of the neovascular fronds was achieved with endolaser. Following hemostasis, continued dissection of membranes and removal of membranes was performed including the superior and nasal retina. Endolaser was applied to the areas where the neovascular  fronds were still present.  3 rows of endolaser were applied 360 degrees to the periphery.  A complete air-fluid exchange was then performed and additional endolaser was applied. 12% C3F8 gas was injected into the eye. The trocars were sequentially removed and all were noted to be airtight. Subconjunctival injections of Ancef and Decadron were placed.   The speculum and drapes were removed and the eye was patched with Polymixin/Bacitracin ophthalmic ointment. An eye shield was placed and the patient was transferred alert and conversant with stable vital signs to the post operative recovery area.  The patient tolerated the procedure well and no complications were noted.

## 2018-12-20 NOTE — Brief Op Note (Signed)
12/20/2018  9:17 AM  PATIENT:  Destiny Day  68 y.o. female  PRE-OPERATIVE DIAGNOSIS:  TYPE TWO DIABETES PROLIFERATIVE  DIABETIC RETINOPATHY, TRACTIONAL RETINAL DETACHMENT INVOLVING MACULA  POST-OPERATIVE DIAGNOSIS:  TYPE TWO DIABETES PROLIFERATIVE  DIABETIC RETINOPATHY, TRACTIONAL RETINAL DETACHMENT INVOLVING MACULA  PROCEDURE:  Procedure(s): REPAIR OF COMPLEX TRACTION RETINAL DETACHMENT, MEMBRANE PEEL WITH AIR GAS EXCHANGE , PHOTOCOAGULATION (Left), MEMBRANECTOMY  SURGEON:  Surgeon(s) and Role:    * Jalene Mullet, MD - Primary  PHYSICIAN ASSISTANT:   ASSISTANTS: none   ANESTHESIA:   local and MAC  EBL:  MINIMAL   BLOOD ADMINISTERED:none  DRAINS: none   LOCAL MEDICATIONS USED:  MARCAINE    and LIDOCAINE   SPECIMEN:  No Specimen  DISPOSITION OF SPECIMEN:  N/A  COUNTS:  YES  TOURNIQUET:  * No tourniquets in log *  DICTATION: .Note written in EPIC  PLAN OF CARE: Discharge to home after PACU  PATIENT DISPOSITION:  PACU - hemodynamically stable.   Delay start of Pharmacological VTE agent (>24hrs) due to surgical blood loss or risk of bleeding: not applicable

## 2018-12-20 NOTE — Anesthesia Postprocedure Evaluation (Signed)
Anesthesia Post Note  Patient: [name]  Procedure(s) Performed: REPAIR OF COMPLEX TRACTION RETINAL DETACHMENT (Left Eye) MEMBRANE PEEL (Left Eye) PHOTOCOAGULATION WITH LASER (Left Eye) AIR GAS EXCHANGE (Left Eye)     Patient location during evaluation: PACU Anesthesia Type: MAC Level of consciousness: awake and alert Pain management: pain level controlled Vital Signs Assessment: post-procedure vital signs reviewed and stable Respiratory status: spontaneous breathing, nonlabored ventilation and respiratory function stable Cardiovascular status: stable and blood pressure returned to baseline Anesthetic complications: no    Last Vitals:  Vitals:   12/20/18 0940 12/20/18 0945  BP: 107/71   Pulse: 72 74  Resp: 17 (!) 22  Temp:  36.6 C  SpO2: 100% 100%    Last Pain:  Vitals:   12/20/18 0945  TempSrc:   PainSc: 0-No pain                 Audry Pili

## 2018-12-21 ENCOUNTER — Encounter (HOSPITAL_COMMUNITY): Payer: Self-pay | Admitting: Ophthalmology

## 2018-12-21 DIAGNOSIS — D509 Iron deficiency anemia, unspecified: Secondary | ICD-10-CM | POA: Diagnosis not present

## 2018-12-21 DIAGNOSIS — N186 End stage renal disease: Secondary | ICD-10-CM | POA: Diagnosis not present

## 2018-12-21 DIAGNOSIS — E1122 Type 2 diabetes mellitus with diabetic chronic kidney disease: Secondary | ICD-10-CM | POA: Diagnosis not present

## 2018-12-21 DIAGNOSIS — Z992 Dependence on renal dialysis: Secondary | ICD-10-CM | POA: Diagnosis not present

## 2018-12-21 DIAGNOSIS — N2581 Secondary hyperparathyroidism of renal origin: Secondary | ICD-10-CM | POA: Diagnosis not present

## 2018-12-21 DIAGNOSIS — M0000 Staphylococcal arthritis, unspecified joint: Secondary | ICD-10-CM | POA: Diagnosis not present

## 2018-12-23 DIAGNOSIS — N2581 Secondary hyperparathyroidism of renal origin: Secondary | ICD-10-CM | POA: Diagnosis not present

## 2018-12-23 DIAGNOSIS — N186 End stage renal disease: Secondary | ICD-10-CM | POA: Diagnosis not present

## 2018-12-23 DIAGNOSIS — M0000 Staphylococcal arthritis, unspecified joint: Secondary | ICD-10-CM | POA: Diagnosis not present

## 2018-12-23 DIAGNOSIS — Z992 Dependence on renal dialysis: Secondary | ICD-10-CM | POA: Diagnosis not present

## 2018-12-23 DIAGNOSIS — D509 Iron deficiency anemia, unspecified: Secondary | ICD-10-CM | POA: Diagnosis not present

## 2018-12-23 DIAGNOSIS — E1122 Type 2 diabetes mellitus with diabetic chronic kidney disease: Secondary | ICD-10-CM | POA: Diagnosis not present

## 2018-12-26 DIAGNOSIS — N186 End stage renal disease: Secondary | ICD-10-CM | POA: Diagnosis not present

## 2018-12-26 DIAGNOSIS — Z992 Dependence on renal dialysis: Secondary | ICD-10-CM | POA: Diagnosis not present

## 2018-12-26 DIAGNOSIS — I159 Secondary hypertension, unspecified: Secondary | ICD-10-CM | POA: Diagnosis not present

## 2018-12-26 DIAGNOSIS — M0000 Staphylococcal arthritis, unspecified joint: Secondary | ICD-10-CM | POA: Diagnosis not present

## 2018-12-26 DIAGNOSIS — E876 Hypokalemia: Secondary | ICD-10-CM | POA: Diagnosis not present

## 2018-12-26 DIAGNOSIS — E1122 Type 2 diabetes mellitus with diabetic chronic kidney disease: Secondary | ICD-10-CM | POA: Diagnosis not present

## 2018-12-26 DIAGNOSIS — D631 Anemia in chronic kidney disease: Secondary | ICD-10-CM | POA: Diagnosis not present

## 2018-12-26 DIAGNOSIS — N2581 Secondary hyperparathyroidism of renal origin: Secondary | ICD-10-CM | POA: Diagnosis not present

## 2018-12-27 DIAGNOSIS — E113511 Type 2 diabetes mellitus with proliferative diabetic retinopathy with macular edema, right eye: Secondary | ICD-10-CM | POA: Diagnosis not present

## 2018-12-28 DIAGNOSIS — N2581 Secondary hyperparathyroidism of renal origin: Secondary | ICD-10-CM | POA: Diagnosis not present

## 2018-12-28 DIAGNOSIS — N186 End stage renal disease: Secondary | ICD-10-CM | POA: Diagnosis not present

## 2018-12-28 DIAGNOSIS — Z992 Dependence on renal dialysis: Secondary | ICD-10-CM | POA: Diagnosis not present

## 2018-12-28 DIAGNOSIS — E1122 Type 2 diabetes mellitus with diabetic chronic kidney disease: Secondary | ICD-10-CM | POA: Diagnosis not present

## 2018-12-28 DIAGNOSIS — M0000 Staphylococcal arthritis, unspecified joint: Secondary | ICD-10-CM | POA: Diagnosis not present

## 2018-12-28 DIAGNOSIS — E876 Hypokalemia: Secondary | ICD-10-CM | POA: Diagnosis not present

## 2018-12-30 DIAGNOSIS — E1122 Type 2 diabetes mellitus with diabetic chronic kidney disease: Secondary | ICD-10-CM | POA: Diagnosis not present

## 2018-12-30 DIAGNOSIS — E876 Hypokalemia: Secondary | ICD-10-CM | POA: Diagnosis not present

## 2018-12-30 DIAGNOSIS — N2581 Secondary hyperparathyroidism of renal origin: Secondary | ICD-10-CM | POA: Diagnosis not present

## 2018-12-30 DIAGNOSIS — N186 End stage renal disease: Secondary | ICD-10-CM | POA: Diagnosis not present

## 2018-12-30 DIAGNOSIS — Z992 Dependence on renal dialysis: Secondary | ICD-10-CM | POA: Diagnosis not present

## 2018-12-30 DIAGNOSIS — M0000 Staphylococcal arthritis, unspecified joint: Secondary | ICD-10-CM | POA: Diagnosis not present

## 2019-01-02 DIAGNOSIS — E1122 Type 2 diabetes mellitus with diabetic chronic kidney disease: Secondary | ICD-10-CM | POA: Diagnosis not present

## 2019-01-02 DIAGNOSIS — N186 End stage renal disease: Secondary | ICD-10-CM | POA: Diagnosis not present

## 2019-01-02 DIAGNOSIS — Z992 Dependence on renal dialysis: Secondary | ICD-10-CM | POA: Diagnosis not present

## 2019-01-02 DIAGNOSIS — N2581 Secondary hyperparathyroidism of renal origin: Secondary | ICD-10-CM | POA: Diagnosis not present

## 2019-01-02 DIAGNOSIS — M0000 Staphylococcal arthritis, unspecified joint: Secondary | ICD-10-CM | POA: Diagnosis not present

## 2019-01-02 DIAGNOSIS — E876 Hypokalemia: Secondary | ICD-10-CM | POA: Diagnosis not present

## 2019-01-03 ENCOUNTER — Ambulatory Visit: Payer: Self-pay | Admitting: Cardiology

## 2019-01-04 DIAGNOSIS — E876 Hypokalemia: Secondary | ICD-10-CM | POA: Diagnosis not present

## 2019-01-04 DIAGNOSIS — M0000 Staphylococcal arthritis, unspecified joint: Secondary | ICD-10-CM | POA: Diagnosis not present

## 2019-01-04 DIAGNOSIS — Z992 Dependence on renal dialysis: Secondary | ICD-10-CM | POA: Diagnosis not present

## 2019-01-04 DIAGNOSIS — E1122 Type 2 diabetes mellitus with diabetic chronic kidney disease: Secondary | ICD-10-CM | POA: Diagnosis not present

## 2019-01-04 DIAGNOSIS — N186 End stage renal disease: Secondary | ICD-10-CM | POA: Diagnosis not present

## 2019-01-04 DIAGNOSIS — N2581 Secondary hyperparathyroidism of renal origin: Secondary | ICD-10-CM | POA: Diagnosis not present

## 2019-01-05 ENCOUNTER — Ambulatory Visit (INDEPENDENT_AMBULATORY_CARE_PROVIDER_SITE_OTHER): Payer: Medicare Other | Admitting: Cardiology

## 2019-01-05 ENCOUNTER — Encounter: Payer: Self-pay | Admitting: Cardiology

## 2019-01-05 ENCOUNTER — Other Ambulatory Visit: Payer: Self-pay

## 2019-01-05 VITALS — BP 109/51 | HR 50 | Ht 65.0 in | Wt 147.0 lb

## 2019-01-05 DIAGNOSIS — E7800 Pure hypercholesterolemia, unspecified: Secondary | ICD-10-CM | POA: Diagnosis not present

## 2019-01-05 DIAGNOSIS — I10 Essential (primary) hypertension: Secondary | ICD-10-CM | POA: Diagnosis not present

## 2019-01-05 DIAGNOSIS — E1151 Type 2 diabetes mellitus with diabetic peripheral angiopathy without gangrene: Secondary | ICD-10-CM | POA: Diagnosis not present

## 2019-01-05 DIAGNOSIS — I110 Hypertensive heart disease with heart failure: Secondary | ICD-10-CM | POA: Diagnosis not present

## 2019-01-05 DIAGNOSIS — I739 Peripheral vascular disease, unspecified: Secondary | ICD-10-CM | POA: Diagnosis not present

## 2019-01-05 DIAGNOSIS — I5032 Chronic diastolic (congestive) heart failure: Secondary | ICD-10-CM | POA: Diagnosis not present

## 2019-01-05 DIAGNOSIS — Z794 Long term (current) use of insulin: Secondary | ICD-10-CM | POA: Diagnosis not present

## 2019-01-05 DIAGNOSIS — I779 Disorder of arteries and arterioles, unspecified: Secondary | ICD-10-CM

## 2019-01-05 DIAGNOSIS — N186 End stage renal disease: Secondary | ICD-10-CM | POA: Diagnosis not present

## 2019-01-05 DIAGNOSIS — R600 Localized edema: Secondary | ICD-10-CM

## 2019-01-05 NOTE — Progress Notes (Signed)
Primary Physician/Referring:  Velna Hatchet, MD  Patient ID: Destiny Day, female    DOB: 1951/03/12, 68 y.o.   MRN: [id number]  Chief Complaint  Patient presents with  . Congestive Heart Failure  . Hypertension  . Follow-up    HPI: Destiny Day  is a 68 y.o. female  [name] female with difficult to control hypertension in the past, uncontrolled diabetes mellitus, history of bilateral symptoms application due to nonhealing ulceration from diabetic foot, hyperlipidemia and end-stage renal disease on hemodialysis since 2017, no CAD by angiography surprisingly remotely in 2007, peripheral arterial disease with history of left iliac aartery stenting and bilateral femoropopliteal grafting,  s/p left transmetatarsal amputation on 09/01/2016, and right TMA on 12-31-17 by Dr. Sharol Given, diabetic retinopathy,   She presents here for annual visit and follow-up of hypertension.  She is made drastic lifestyle changes and since last year her blood pressure has been very well controlled, diabetes is now well controlled, she looks well and is gaining weight.  States that she is feeling the best she has in quite a years.  Denies dyspnea, leg edema, PND or orthopnea.  States that she wear support stockings to prevent pain and also leg edema and requests refill for the same.  Vascular disease is being followed by Dr. Curt Jews.  Past Medical History:  Diagnosis Date  . Abdominal bruit   . Anemia   . Anxiety   . Arthritis    Osteoarthritis  . Asthma   . Cervical disc disease    "pinced nerve"  . CHF (congestive heart failure) ([place])   . Complication of anesthesia    " after I got home from my last procedure, I started itching."  . Diabetes mellitus    Type II  . Diverticulitis   . ESRD (end stage renal disease) ([place])    dialysis - M/W/F- [place]  . GERD (gastroesophageal reflux disease)    from medications  . GI bleed 03/31/2013  . Head injury 07/2017  .  History of hiatal hernia   . Hyperlipidemia   . Hypertension   . Neuropathy    left leg  . Osteoporosis   . Peripheral vascular disease ([place])   . Pneumonia    "very young" and a few years ago  . PONV (postoperative nausea and vomiting)   . Seasonal allergies   . Shortness of breath dyspnea    WIth exertion, when fluid builds  . Sleep apnea    can't afford cpap     Past Surgical History:  Procedure Laterality Date  . A/V SHUNTOGRAM N/A 09/22/2016   Procedure: A/V Shuntogram - left arm;  Surgeon: Serafina Mitchell, MD;  Location: [place] [facility];  Service: Cardiovascular;  Laterality: N/A;  . A/V SHUNTOGRAM N/A 03/22/2018   Procedure: A/V SHUNTOGRAM - left arm;  Surgeon: Serafina Mitchell, MD;  Location: [place] [facility];  Service: Cardiovascular;  Laterality: N/A;  . A/V SHUNTOGRAM Left 11/17/2018   Procedure: A/V SHUNTOGRAM;  Surgeon: Angelia Mould, MD;  Location: [place] [facility];  Service: Cardiovascular;  Laterality: Left;  . ABDOMINAL HYSTERECTOMY  1993`  . AMPUTATION Left 09/01/2016   Procedure: LEFT FOOT TRANSMETATARSAL AMPUTATION;  Surgeon: Newt Minion, MD;  Location: [place];  Service: Orthopedics;  Laterality: Left;  . AMPUTATION Right 08/11/2017   Procedure: RIGHT GREAT TOE AMPUTATION DIGIT;  Surgeon: Rosetta Posner, MD;  Location: [place];  Service: Vascular;  Laterality: Right;  .  AMPUTATION Right 12/31/2017   Procedure: RIGHT TRANSMETATARSAL AMPUTATION;  Surgeon: Newt Minion, MD;  Location: [place];  Service: Orthopedics;  Laterality: Right;  . AV FISTULA PLACEMENT Left 04/21/2016   Procedure: INSERTION OF ARTERIOVENOUS (AV) GORE-TEX GRAFT ARM LEFT;  Surgeon: Elam Dutch, MD;  Location: [place];  Service: Vascular;  Laterality: Left;  . [place] TRANSPOSITION Left 07/10/2014   Procedure: BASCILIC VEIN TRANSPOSITION;  Surgeon: Angelia Mould, MD;  Location: [place];  Service: Vascular;  Laterality: Left;  . BASCILIC VEIN TRANSPOSITION Right 11/08/2014    Procedure: FIRST STAGE BASILIC VEIN TRANSPOSITION;  Surgeon: Angelia Mould, MD;  Location: [place];  Service: Vascular;  Laterality: Right;  . BASCILIC VEIN TRANSPOSITION Right 01/18/2015   Procedure: SECOND STAGE BASILIC VEIN TRANSPOSITION;  Surgeon: Angelia Mould, MD;  Location: [place];  Service: Vascular;  Laterality: Right;  . CRANIOTOMY N/A 08/23/2017   Procedure: CRANIOTOMY HEMATOMA EVACUATION SUBDURAL;  Surgeon: Ashok Pall, MD;  Location: [place];  Service: Neurosurgery;  Laterality: N/A;  . CRANIOTOMY Left 08/24/2017   Procedure: CRANIOTOMY FOR RECURRENT ACUTE SUBDURAL HEMATOMA;  Surgeon: Ashok Pall, MD;  Location: [place];  Service: Neurosurgery;  Laterality: Left;  . ESOPHAGOGASTRODUODENOSCOPY N/A 03/31/2013   Procedure: ESOPHAGOGASTRODUODENOSCOPY (EGD);  Surgeon: Gatha Mayer, MD;  Location: [facility];  Service: Endoscopy;  Laterality: N/A;  . EYE SURGERY     laser surgery  . FEMORAL-POPLITEAL BYPASS GRAFT Left 07/08/2016   Procedure: LEFT  FEMORAL-BELOW KNEE POPLITEAL ARTERY BYPASS GRAFT USING 6MM X 80 CM PROPATEN GORETEX GRAFT WITH RINGS.;  Surgeon: Rosetta Posner, MD;  Location: [place];  Service: Vascular;  Laterality: Left;  . FEMORAL-POPLITEAL BYPASS GRAFT Right 08/11/2017   Procedure: RIGHT FEMORAL TO BELOW KNEE POPLITEAL ARTERKY  BYPASS GRAFT USING 6MM RINGED PROPATEN GRAFT;  Surgeon: Rosetta Posner, MD;  Location: [place];  Service: Vascular;  Laterality: Right;  . FISTULOGRAM Left 10/29/2014   Procedure: FISTULOGRAM;  Surgeon: Angelia Mould, MD;  Location: [facility] CATH LAB;  Service: Cardiovascular;  Laterality: Left;  Mar[place] Kitchen GAS/FLUID EXCHANGE Left 12/20/2018   Procedure: AIR GAS EXCHANGE;  Surgeon: Jalene Mullet, MD;  Location: [place];  Service: Ophthalmology;  Laterality: Left;  . KNEE ARTHROSCOPY Right 05/06/2018   Procedure: RIGHT KNEE ARTHROSCOPY;  Surgeon: Newt Minion, MD;  Location: [place];  Service: Orthopedics;  Laterality: Right;  . KNEE ARTHROSCOPY Right  06/10/2018   Procedure: RIGHT KNEE ARTHROSCOPY AND DEBRIDEMENT;  Surgeon: Newt Minion, MD;  Location: [place];  Service: Orthopedics;  Laterality: Right;  . LIGATION OF ARTERIOVENOUS  FISTULA Left 04/21/2016   Procedure: LIGATION OF ARTERIOVENOUS  FISTULA LEFT ARM;  Surgeon: Elam Dutch, MD;  Location: [place];  Service: Vascular;  Laterality: Left;  . LOWER EXTREMITY ANGIOGRAPHY N/A 04/06/2017   Procedure: Lower Extremity Angiography - Right;  Surgeon: Serafina Mitchell, MD;  Location: [place] [facility];  Service: Cardiovascular;  Laterality: N/A;  . MEMBRANE PEEL Left 12/20/2018   Procedure: MEMBRANE PEEL;  Surgeon: Jalene Mullet, MD;  Location: [place];  Service: Ophthalmology;  Laterality: Left;  . PATCH ANGIOPLASTY Right 01/18/2015   Procedure: BASILIC VEIN PATCH ANGIOPLASTY USING VASCUGUARD PATCH;  Surgeon: Angelia Mould, MD;  Location: [place];  Service: Vascular;  Laterality: Right;  . PERIPHERAL VASCULAR BALLOON ANGIOPLASTY  09/22/2016   Procedure: Peripheral Vascular Balloon Angioplasty;  Surgeon: Serafina Mitchell, MD;  Location: [place] [facility];  Service: Cardiovascular;;  Lt. Fistula  .  PERIPHERAL VASCULAR BALLOON ANGIOPLASTY Left 03/22/2018   Procedure: PERIPHERAL VASCULAR BALLOON ANGIOPLASTY;  Surgeon: Serafina Mitchell, MD;  Location: [place] [facility];  Service: Cardiovascular;  Laterality: Left;  Arm shunt  . PERIPHERAL VASCULAR CATHETERIZATION N/A 06/23/2016   Procedure: Abdominal Aortogram w/Lower Extremity;  Surgeon: Serafina Mitchell, MD;  Location: [place] [facility];  Service: Cardiovascular;  Laterality: N/A;  . PERIPHERAL VASCULAR CATHETERIZATION  06/23/2016   Procedure: Peripheral Vascular Intervention;  Surgeon: Serafina Mitchell, MD;  Location: [place] [facility];  Service: Cardiovascular;;  lt common and external illiac artery  . PHOTOCOAGULATION WITH LASER Left 12/20/2018   Procedure: PHOTOCOAGULATION WITH LASER;  Surgeon: Jalene Mullet, MD;  Location: [place];   Service: Ophthalmology;  Laterality: Left;  . 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   Procedure: REPAIR OF COMPLEX TRACTION RETINAL DETACHMENT;  Surgeon: Jalene Mullet, MD;  Location: [place];  Service: Ophthalmology;  Laterality: Left;    Social History   Socioeconomic History  . Marital status: Married    Spouse name: [name]  . Number of children: 4  . Years of education: some collg  . Highest education level: Not on file  Occupational History  . Occupation: Retired  Scientific laboratory technician  . Financial resource strain: Not on file  . Food insecurity    Worry: Not on file    Inability: Not on file  . Transportation needs    Medical: Not on file    Non-medical: Not on file  Tobacco Use  . Smoking status: Former Smoker    Packs/day: 0.35    Years: 40.00    Pack years: 14.00    Types: Cigarettes    Quit date: 05/10/2012    Years since quitting: 6.6  . Smokeless tobacco: Never Used  Substance and Sexual Activity  . Alcohol use: No  . Drug use: No    Comment: marijuana; quit in early 1980's  . Sexual activity: Yes  Lifestyle  . Physical activity    Days per week: Not on file    Minutes per session: Not on file  . Stress: Not on file  Relationships  . Social Herbalist on phone: Not on file    Gets together: Not on file    Attends religious service: Not on file    Active member of club or organization: Not on file    Attends meetings of clubs or organizations: Not on file    Relationship status: Not on file  . Intimate partner violence    Fear of current or ex partner: Not on file    Emotionally abused: Not on file    Physically abused: Not on file    Forced sexual activity: Not on file  Other Topics Concern  . Not on file  Social History Narrative   Patient lives at home with spouse.   Caffeine Use: 1 cup daily    Review of Systems  Constitution: Negative for chills, decreased appetite, malaise/fatigue and weight gain.  Eyes:  Positive for blurred vision.  Cardiovascular: Negative for chest pain, dyspnea on exertion, leg swelling and syncope.  Respiratory: Positive for cough.   Endocrine: Negative for cold intolerance.  Hematologic/Lymphatic: Negative for bleeding problem. Does not bruise/bleed easily.  Musculoskeletal: Positive for muscle weakness (uses walker ). Negative for joint swelling.  Gastrointestinal: Negative for abdominal pain, anorexia, change in bowel habit, hematochezia and melena.  Neurological: Negative for headaches and light-headedness.  Psychiatric/Behavioral: Negative for depression and  substance abuse.  All other systems reviewed and are negative.     Objective  Blood pressure (!) 109/51, pulse (!) 50, height 5\' 5"  (1.651 m), weight 147 lb (66.7 kg), SpO2 92 %. Body mass index is 24.46 kg/m.    Physical Exam  Constitutional: She appears well-nourished. No distress.  Chronically ill looking  HENT:  Head: Atraumatic.  Eyes: Conjunctivae are normal.  Neck: Neck supple. No JVD present. No thyromegaly present.  Cardiovascular: Normal rate, regular rhythm and normal heart sounds. Exam reveals no gallop.  No murmur heard. Pulses:      Carotid pulses are on the right side with bruit and on the left side with bruit. Pulmonary/Chest: Effort normal and breath sounds normal.  Abdominal: Soft. Bowel sounds are normal.  Musculoskeletal: Normal range of motion.  Neurological: She is alert.  Skin: Skin is warm and dry.  Psychiatric: She has a normal mood and affect.   Radiology: No results found.  Laboratory examination:    CMP Latest Ref Rng & Units 12/20/2018 12/15/2018 11/17/2018  Glucose 70 - 99 mg/dL 79 211(H) 72  BUN 8 - 23 mg/dL - 22 -  Creatinine 0.44 - 1.00 mg/dL - 4.67(H) -  Sodium 135 - 145 mmol/L 136 135 134(L)  Potassium 3.5 - 5.1 mmol/L 3.7 4.2 4.4  Chloride 98 - 111 mmol/L - 92(L) -  CO2 22 - 32 mmol/L - 29 -  Calcium 8.9 - 10.3 mg/dL - 7.8(L) -  Total Protein 6.5 - 8.1  g/dL - - -  Total Bilirubin 0.3 - 1.2 mg/dL - - -  Alkaline Phos 38 - 126 U/L - - -  AST 15 - 41 U/L - - -  ALT 0 - 44 U/L - - -   CBC Latest Ref Rng & Units 12/20/2018 12/15/2018 11/17/2018  WBC 4.0 - 10.5 K/uL - 5.9 -  Hemoglobin 12.0 - 15.0 g/dL 12.2 12.3 12.2  Hematocrit 36.0 - 46.0 % 36.0 38.9 36.0  Platelets 150 - 400 K/uL - 260 -   Lipid Panel   HEMOGLOBIN A1C Lab Results  Component Value Date   HGBA1C 7.2 (H) 05/06/2018   MPG 159.94 05/06/2018    Medications   Medications Discontinued During This Encounter  Medication Reason  . clopidogrel (PLAVIX) 75 MG tablet Error  . oxyCODONE-acetaminophen (PERCOCET/ROXICET) 5-325 MG tablet Error   Current Meds  Medication Sig  . albuterol (PROVENTIL HFA;VENTOLIN HFA) 108 (90 Base) MCG/ACT inhaler Inhale 1-2 puffs into the lungs every 6 (six) hours as needed for wheezing or shortness of breath.  Marland Kitchen amLODipine (NORVASC) 10 MG tablet Take 10 mg by mouth at bedtime.  Marland Kitchen aspirin EC 81 MG tablet Take 81 mg by mouth daily.  . budesonide-formoterol (SYMBICORT) 160-4.5 MCG/ACT inhaler Inhale 1 puff into the lungs daily as needed (respiratory issues.).   Marland Kitchen carvedilol (COREG) 12.5 MG tablet Take 12.5 mg by mouth See admin instructions. Take 1 tablet (12.5 mg) by mouth once daily in the evening on Mondays, Wednesdays, & Fridays. (Dialysis) Take 1 tablet (12.5 mg) by mouth twice daily on Sundays, Tuesdays, Thursdays, & Saturdays. (Non Dialysis)  . cinacalcet (SENSIPAR) 90 MG tablet Take 90 mg by mouth daily. Take 1 tablet (90 mg) by mouth in the evening on Mondays, Wednesdays, & Fridays (dialysis) Take 1 tablet (90 mg) by mouth twice daily on Sundays, Tuesdays, Thursdays, Saturdays. (non dialysis)  . diazepam (VALIUM) 5 MG tablet Take 5 mg by mouth 2 (two) times daily as needed for anxiety.  Marland Kitchen  ferric citrate (AURYXIA) 1 GM 210 MG(Fe) tablet Take 210-420 mg by mouth See admin instructions. Take 2 tablets (420 mg) by mouth with each meal & take 1  tablet (210 mg) by mouth with snacks.  . fluticasone (FLONASE) 50 MCG/ACT nasal spray Place 1 spray into both nostrils daily as needed for allergies or rhinitis.  Marland Kitchen gabapentin (NEURONTIN) 100 MG capsule TAKE 1 CAPSULE (100 MG TOTAL) BY MOUTH AT BEDTIME. WHEN NECESSARY FOR NEUROPATHY PAIN  . guaiFENesin (ROBITUSSIN) 100 MG/5ML liquid Take 100 mg by mouth every 12 (twelve) hours as needed for cough.  . hydrocerin (EUCERIN) CREA Apply 277 application topically 2 (two) times daily. (Patient taking differently: Apply 1 application topically 2 (two) times daily. )  . insulin NPH Human (HUMULIN N,NOVOLIN N) 100 UNIT/ML injection Inject 0.1 mLs (10 Units total) into the skin at bedtime.  . insulin regular (NOVOLIN R,HUMULIN R) 100 units/mL injection Inject 3-8 Units into the skin See admin instructions. 3 times daily with meals on Sundays, Tuesdays, Thursdays, & Saturdays per sliding scale  . isosorbide-hydrALAZINE (BIDIL) 20-37.5 MG tablet Take 1 tablet by mouth 3 (three) times daily. On NON dialysis days pt takes 1 tab 3 times daily, and on Dialysis days, pt takes 1 tab every evning (Patient taking differently: Take 1 tablet by mouth See admin instructions. Take 1 tablet by mouth in the evening on Mondays, Wednesdays, & Fridays. (Dialysis) Take 1 tablet 3 times daily on Sundays, Tuesdays, Thursdays, & Saturdays. (Non-Dialysis))  . lidocaine-prilocaine (EMLA) cream Apply 1 application topically 3 (three) times a week. 1-2 hours prior to dialysis  . loratadine (CLARITIN) 10 MG tablet Take 10 mg by mouth daily as needed for allergies.  . multivitamin (RENA-VIT) TABS tablet Take 1 tablet by mouth daily.   Marland Kitchen omeprazole (PRILOSEC) 20 MG capsule Take 20 mg by mouth daily.   Marland Kitchen senna (SENOKOT) 8.6 MG tablet Take 1 tablet by mouth as needed for constipation.  . Vitamin D, Ergocalciferol, (DRISDOL) 1.25 MG (50000 UT) CAPS capsule Take 50,000 Units by mouth every Sunday. Takes on Sunday    Cardiac Studies:    Echocardiogram 01/23/2018:  Normal LV systolic function, EF 82-42%, grade 2 diastolic dysfunction with elevated left atrial and LV EDP.  Moderate pulmonary hypertension with PA pressure measuring 44 mmHg.  No CAD by angiography surprisingly remotely in 2007  Assessment   1. Hypertensive heart disease with heart failure ([place])   2. Chronic diastolic heart failure ([place])   3. PAD (peripheral artery disease) ([place])   4. Hypercholesteremia    EKG 01/05/2019: Sinus tachycardia cardiac rate of 100 bpm, left atrial enlargement, normal axis.  Cannot exclude septal infarct old.  LVH with repolarization abnormality.  Recommendations:   It is unbelievable to see Jonetta Speak today. I am pleasantly surprised that she has made changes to her lifestyle and looks great. BP is now well controlled on B[place]il. I have refilled her support stockings and she has to watch out for PAD, but states without them, she has pain and swelling. No clinical e/o acute decompensated CHF.   She was on atorvastatin in the past and I do not see it on the list. I realized this after patient left and spoke to the patient on the telephone and she is willing to start this back and she knew it has been dropped off from her list. She will ask for refills and f/u on this with Dr. Ardeth Perfect.   I reviewed her papers which shows excellent DM control.  I will continue to see her annually.   Adrian Prows, MD, [facility] 01/07/2019, 1:50 PM [facility] [place] Pager: [phone] Office: [phone] If no answer Cell [phone]

## 2019-01-06 ENCOUNTER — Other Ambulatory Visit: Payer: Self-pay

## 2019-01-06 DIAGNOSIS — M0000 Staphylococcal arthritis, unspecified joint: Secondary | ICD-10-CM | POA: Diagnosis not present

## 2019-01-06 DIAGNOSIS — N2581 Secondary hyperparathyroidism of renal origin: Secondary | ICD-10-CM | POA: Diagnosis not present

## 2019-01-06 DIAGNOSIS — Z992 Dependence on renal dialysis: Secondary | ICD-10-CM | POA: Diagnosis not present

## 2019-01-06 DIAGNOSIS — N186 End stage renal disease: Secondary | ICD-10-CM | POA: Diagnosis not present

## 2019-01-06 DIAGNOSIS — E1122 Type 2 diabetes mellitus with diabetic chronic kidney disease: Secondary | ICD-10-CM | POA: Diagnosis not present

## 2019-01-06 DIAGNOSIS — I7025 Atherosclerosis of native arteries of other extremities with ulceration: Secondary | ICD-10-CM

## 2019-01-06 DIAGNOSIS — I779 Disorder of arteries and arterioles, unspecified: Secondary | ICD-10-CM

## 2019-01-06 DIAGNOSIS — E876 Hypokalemia: Secondary | ICD-10-CM | POA: Diagnosis not present

## 2019-01-07 ENCOUNTER — Encounter: Payer: Self-pay | Admitting: Cardiology

## 2019-01-07 MED ORDER — ATORVASTATIN CALCIUM 20 MG PO TABS: 20.0000 mg | ORAL_TABLET | Freq: Every day | ORAL | 0 refills | Status: DC

## 2019-01-09 DIAGNOSIS — E1122 Type 2 diabetes mellitus with diabetic chronic kidney disease: Secondary | ICD-10-CM | POA: Diagnosis not present

## 2019-01-09 DIAGNOSIS — Z992 Dependence on renal dialysis: Secondary | ICD-10-CM | POA: Diagnosis not present

## 2019-01-09 DIAGNOSIS — E876 Hypokalemia: Secondary | ICD-10-CM | POA: Diagnosis not present

## 2019-01-09 DIAGNOSIS — N2581 Secondary hyperparathyroidism of renal origin: Secondary | ICD-10-CM | POA: Diagnosis not present

## 2019-01-09 DIAGNOSIS — M0000 Staphylococcal arthritis, unspecified joint: Secondary | ICD-10-CM | POA: Diagnosis not present

## 2019-01-09 DIAGNOSIS — N186 End stage renal disease: Secondary | ICD-10-CM | POA: Diagnosis not present

## 2019-01-10 ENCOUNTER — Encounter: Payer: Self-pay | Admitting: Vascular Surgery

## 2019-01-10 ENCOUNTER — Ambulatory Visit (HOSPITAL_COMMUNITY)
Admission: RE | Admit: 2019-01-10 | Discharge: 2019-01-10 | Disposition: A | Discharge status: Home / Self Care | Payer: Medicare Other | Source: Ambulatory Visit | Attending: Family | Admitting: Family

## 2019-01-10 ENCOUNTER — Ambulatory Visit (INDEPENDENT_AMBULATORY_CARE_PROVIDER_SITE_OTHER): Payer: Medicare Other | Admitting: Vascular Surgery

## 2019-01-10 ENCOUNTER — Ambulatory Visit (INDEPENDENT_AMBULATORY_CARE_PROVIDER_SITE_OTHER)
Admission: RE | Admit: 2019-01-10 | Discharge: 2019-01-10 | Disposition: A | Discharge status: Home / Self Care | Payer: Medicare Other | Source: Ambulatory Visit | Attending: Family | Admitting: Family

## 2019-01-10 ENCOUNTER — Other Ambulatory Visit: Payer: Self-pay

## 2019-01-10 VITALS — BP 142/75 | HR 70 | Temp 97.9°F | Resp 20 | Ht 65.0 in | Wt 148.4 lb

## 2019-01-10 DIAGNOSIS — I7025 Atherosclerosis of native arteries of other extremities with ulceration: Secondary | ICD-10-CM

## 2019-01-10 DIAGNOSIS — I779 Disorder of arteries and arterioles, unspecified: Secondary | ICD-10-CM | POA: Insufficient documentation

## 2019-01-10 NOTE — Progress Notes (Signed)
Vascular and Vein [facility]ist of [place]  Patient name: Destiny Day MRN: [id number] DOB: 12-Aug-1950 Sex: female  REASON FOR VISIT: Follow-up diffuse peripheral vascular occlusive disease  HPI: Destiny Day is a 68 y.o. female here today for follow-up.  She actually looks quite good today.  She is walking with a rolling walker.  She suffered a fall while in the hospital and had subdural hematoma which required evacuation on 2 separate occasions he has no claudication symptoms and no lower extremity tissue loss.  Past Medical History:  Diagnosis Date  . Abdominal bruit   . Anemia   . Anxiety   . Arthritis    Osteoarthritis  . Asthma   . Cervical disc disease    "pinced nerve"  . CHF (congestive heart failure) ([place])   . Complication of anesthesia    " after I got home from my last procedure, I started itching."  . Diabetes mellitus    Type II  . Diverticulitis   . ESRD (end stage renal disease) ([place])    dialysis - M/W/F- [place]  . GERD (gastroesophageal reflux disease)    from medications  . GI bleed 03/31/2013  . Head injury 07/2017  . History of hiatal hernia   . Hyperlipidemia   . Hypertension   . Neuropathy    left leg  . Osteoporosis   . Peripheral vascular disease ([place])   . Pneumonia    "very young" and a few years ago  . PONV (postoperative nausea and vomiting)   . Seasonal allergies   . Shortness of breath dyspnea    WIth exertion, when fluid builds  . Sleep apnea    can't afford cpap     Family History  Problem Relation Age of Onset  . Other Mother        not sure of cause of death  . Diabetes Father   . Pancreatic cancer Maternal Grandmother   . Colon cancer Neg Hx     SOCIAL HISTORY: Social History   Tobacco Use  . Smoking status: Former Smoker    Packs/day: 0.35    Years: 40.00    Pack years: 14.00    Types: Cigarettes    Quit date: 05/10/2012    Years since quitting: 6.6  . Smokeless  tobacco: Never Used  Substance Use Topics  . Alcohol use: No    Allergies  Allergen Reactions  . Prednisone Swelling and Other (See Comments)    Excessive fluid buildup  . Lisinopril Cough    Current Outpatient Medications  Medication Sig Dispense Refill  . albuterol (PROVENTIL HFA;VENTOLIN HFA) 108 (90 Base) MCG/ACT inhaler Inhale 1-2 puffs into the lungs every 6 (six) hours as needed for wheezing or shortness of breath. 1 Inhaler 0  . amLODipine (NORVASC) 10 MG tablet Take 10 mg by mouth at bedtime.    Marland Kitchen aspirin EC 81 MG tablet Take 81 mg by mouth daily.    Marland Kitchen atorvastatin (LIPITOR) 20 MG tablet Take 1 tablet (20 mg total) by mouth daily. 30 tablet 0  . budesonide-formoterol (SYMBICORT) 160-4.5 MCG/ACT inhaler Inhale 1 puff into the lungs daily as needed (respiratory issues.).     Marland Kitchen carvedilol (COREG) 12.5 MG tablet Take 12.5 mg by mouth See admin instructions. Take 1 tablet (12.5 mg) by mouth once daily in the evening on Mondays, Wednesdays, & Fridays. (Dialysis) Take 1 tablet (12.5 mg) by mouth twice daily on Sundays, Tuesdays, Thursdays, & Saturdays. (Non Dialysis)    .  cinacalcet (SENSIPAR) 90 MG tablet Take 90 mg by mouth daily. Take 1 tablet (90 mg) by mouth in the evening on Mondays, Wednesdays, & Fridays (dialysis) Take 1 tablet (90 mg) by mouth twice daily on Sundays, Tuesdays, Thursdays, Saturdays. (non dialysis)    . diazepam (VALIUM) 5 MG tablet Take 5 mg by mouth 2 (two) times daily as needed for anxiety.    . ferric citrate (AURYXIA) 1 GM 210 MG(Fe) tablet Take 210-420 mg by mouth See admin instructions. Take 2 tablets (420 mg) by mouth with each meal & take 1 tablet (210 mg) by mouth with snacks.    . fluticasone (FLONASE) 50 MCG/ACT nasal spray Place 1 spray into both nostrils daily as needed for allergies or rhinitis.    Marland Kitchen gabapentin (NEURONTIN) 100 MG capsule TAKE 1 CAPSULE (100 MG TOTAL) BY MOUTH AT BEDTIME. WHEN NECESSARY FOR NEUROPATHY PAIN 30 capsule 3  . guaiFENesin  (ROBITUSSIN) 100 MG/5ML liquid Take 100 mg by mouth every 12 (twelve) hours as needed for cough.    . hydrocerin (EUCERIN) CREA Apply 379 application topically 2 (two) times daily. (Patient taking differently: Apply 1 application topically 2 (two) times daily. ) 228 g 0  . insulin NPH Human (HUMULIN N,NOVOLIN N) 100 UNIT/ML injection Inject 0.1 mLs (10 Units total) into the skin at bedtime. 10 mL 0  . insulin regular (NOVOLIN R,HUMULIN R) 100 units/mL injection Inject 3-8 Units into the skin See admin instructions. 3 times daily with meals on Sundays, Tuesdays, Thursdays, & Saturdays per sliding scale    . isosorbide-hydrALAZINE (BIDIL) 20-37.5 MG tablet Take 1 tablet by mouth 3 (three) times daily. On NON dialysis days pt takes 1 tab 3 times daily, and on Dialysis days, pt takes 1 tab every evning (Patient taking differently: Take 1 tablet by mouth See admin instructions. Take 1 tablet by mouth in the evening on Mondays, Wednesdays, & Fridays. (Dialysis) Take 1 tablet 3 times daily on Sundays, Tuesdays, Thursdays, & Saturdays. (Non-Dialysis)) 270 tablet 3  . lidocaine-prilocaine (EMLA) cream Apply 1 application topically 3 (three) times a week. 1-2 hours prior to dialysis  12  . loratadine (CLARITIN) 10 MG tablet Take 10 mg by mouth daily as needed for allergies.    . multivitamin (RENA-VIT) TABS tablet Take 1 tablet by mouth daily.   5  . omeprazole (PRILOSEC) 20 MG capsule Take 20 mg by mouth daily.     Marland Kitchen senna (SENOKOT) 8.6 MG tablet Take 1 tablet by mouth as needed for constipation.    . Vitamin D, Ergocalciferol, (DRISDOL) 1.25 MG (50000 UT) CAPS capsule Take 50,000 Units by mouth every Sunday. Takes on Sunday      No current facility-administered medications for this visit.     REVIEW OF SYSTEMS:  [X]  denotes positive finding, [ ]  denotes negative finding Cardiac  Comments:  Chest pain or chest pressure:    Shortness of breath upon exertion:    Short of breath when lying flat:     Irregular heart rhythm:        Vascular    Pain in calf, thigh, or hip brought on by ambulation:    Pain in feet at night that wakes you up from your sleep:     Blood clot in your veins:    Leg swelling:           PHYSICAL EXAM: Vitals:   01/10/19 1054  BP: (!) 142/75  Pulse: 70  Resp: 20  Temp: 97.9 F (36.6 C)  SpO2: 100%  Weight: 67.3 kg  Height: 5\' 5"  (1.651 m)    GENERAL: The patient is a well-nourished female, in no acute distress. The vital signs are documented above. CARDIOVASCULAR: 2+ radial and 2+ popliteal pulses bilaterally. PULMONARY: There is good air exchange  MUSCULOSKELETAL: There are no major deformities or cyanosis. NEUROLOGIC: No focal weakness or paresthesias are detected. SKIN: There are no ulcers or rashes noted. PSYCHIATRIC: The patient has a normal affect.  DATA:  Noninvasive studies reveal normal ankle arm index bilaterally with widely patent femoral to popliteal bypass bilaterally  MEDICAL ISSUES: Stable overall.  Will continue her usual walking program.  We will see [place] in 1 year with repeat noninvasive studies.  Will notify should she develop any difficulty in the interim    Rosetta Posner, MD [facility] and Vein Specialists of [facility] Tel [phone] Pager [phone]

## 2019-01-11 DIAGNOSIS — M0000 Staphylococcal arthritis, unspecified joint: Secondary | ICD-10-CM | POA: Diagnosis not present

## 2019-01-11 DIAGNOSIS — N2581 Secondary hyperparathyroidism of renal origin: Secondary | ICD-10-CM | POA: Diagnosis not present

## 2019-01-11 DIAGNOSIS — N186 End stage renal disease: Secondary | ICD-10-CM | POA: Diagnosis not present

## 2019-01-11 DIAGNOSIS — E1122 Type 2 diabetes mellitus with diabetic chronic kidney disease: Secondary | ICD-10-CM | POA: Diagnosis not present

## 2019-01-11 DIAGNOSIS — E876 Hypokalemia: Secondary | ICD-10-CM | POA: Diagnosis not present

## 2019-01-11 DIAGNOSIS — Z992 Dependence on renal dialysis: Secondary | ICD-10-CM | POA: Diagnosis not present

## 2019-01-13 DIAGNOSIS — N2581 Secondary hyperparathyroidism of renal origin: Secondary | ICD-10-CM | POA: Diagnosis not present

## 2019-01-13 DIAGNOSIS — E1122 Type 2 diabetes mellitus with diabetic chronic kidney disease: Secondary | ICD-10-CM | POA: Diagnosis not present

## 2019-01-13 DIAGNOSIS — N186 End stage renal disease: Secondary | ICD-10-CM | POA: Diagnosis not present

## 2019-01-13 DIAGNOSIS — M0000 Staphylococcal arthritis, unspecified joint: Secondary | ICD-10-CM | POA: Diagnosis not present

## 2019-01-13 DIAGNOSIS — Z992 Dependence on renal dialysis: Secondary | ICD-10-CM | POA: Diagnosis not present

## 2019-01-13 DIAGNOSIS — E876 Hypokalemia: Secondary | ICD-10-CM | POA: Diagnosis not present

## 2019-01-16 DIAGNOSIS — E876 Hypokalemia: Secondary | ICD-10-CM | POA: Diagnosis not present

## 2019-01-16 DIAGNOSIS — Z992 Dependence on renal dialysis: Secondary | ICD-10-CM | POA: Diagnosis not present

## 2019-01-16 DIAGNOSIS — N2581 Secondary hyperparathyroidism of renal origin: Secondary | ICD-10-CM | POA: Diagnosis not present

## 2019-01-16 DIAGNOSIS — M0000 Staphylococcal arthritis, unspecified joint: Secondary | ICD-10-CM | POA: Diagnosis not present

## 2019-01-16 DIAGNOSIS — N186 End stage renal disease: Secondary | ICD-10-CM | POA: Diagnosis not present

## 2019-01-16 DIAGNOSIS — E1122 Type 2 diabetes mellitus with diabetic chronic kidney disease: Secondary | ICD-10-CM | POA: Diagnosis not present

## 2019-01-18 DIAGNOSIS — E876 Hypokalemia: Secondary | ICD-10-CM | POA: Diagnosis not present

## 2019-01-18 DIAGNOSIS — E1122 Type 2 diabetes mellitus with diabetic chronic kidney disease: Secondary | ICD-10-CM | POA: Diagnosis not present

## 2019-01-18 DIAGNOSIS — M0000 Staphylococcal arthritis, unspecified joint: Secondary | ICD-10-CM | POA: Diagnosis not present

## 2019-01-18 DIAGNOSIS — Z992 Dependence on renal dialysis: Secondary | ICD-10-CM | POA: Diagnosis not present

## 2019-01-18 DIAGNOSIS — N2581 Secondary hyperparathyroidism of renal origin: Secondary | ICD-10-CM | POA: Diagnosis not present

## 2019-01-18 DIAGNOSIS — N186 End stage renal disease: Secondary | ICD-10-CM | POA: Diagnosis not present

## 2019-01-20 DIAGNOSIS — N186 End stage renal disease: Secondary | ICD-10-CM | POA: Diagnosis not present

## 2019-01-20 DIAGNOSIS — E876 Hypokalemia: Secondary | ICD-10-CM | POA: Diagnosis not present

## 2019-01-20 DIAGNOSIS — Z992 Dependence on renal dialysis: Secondary | ICD-10-CM | POA: Diagnosis not present

## 2019-01-20 DIAGNOSIS — N2581 Secondary hyperparathyroidism of renal origin: Secondary | ICD-10-CM | POA: Diagnosis not present

## 2019-01-20 DIAGNOSIS — E1122 Type 2 diabetes mellitus with diabetic chronic kidney disease: Secondary | ICD-10-CM | POA: Diagnosis not present

## 2019-01-20 DIAGNOSIS — M0000 Staphylococcal arthritis, unspecified joint: Secondary | ICD-10-CM | POA: Diagnosis not present

## 2019-01-23 DIAGNOSIS — E1122 Type 2 diabetes mellitus with diabetic chronic kidney disease: Secondary | ICD-10-CM | POA: Diagnosis not present

## 2019-01-23 DIAGNOSIS — N2581 Secondary hyperparathyroidism of renal origin: Secondary | ICD-10-CM | POA: Diagnosis not present

## 2019-01-23 DIAGNOSIS — E876 Hypokalemia: Secondary | ICD-10-CM | POA: Diagnosis not present

## 2019-01-23 DIAGNOSIS — Z992 Dependence on renal dialysis: Secondary | ICD-10-CM | POA: Diagnosis not present

## 2019-01-23 DIAGNOSIS — M0000 Staphylococcal arthritis, unspecified joint: Secondary | ICD-10-CM | POA: Diagnosis not present

## 2019-01-23 DIAGNOSIS — N186 End stage renal disease: Secondary | ICD-10-CM | POA: Diagnosis not present

## 2019-01-25 DIAGNOSIS — I159 Secondary hypertension, unspecified: Secondary | ICD-10-CM | POA: Diagnosis not present

## 2019-01-25 DIAGNOSIS — E1122 Type 2 diabetes mellitus with diabetic chronic kidney disease: Secondary | ICD-10-CM | POA: Diagnosis not present

## 2019-01-25 DIAGNOSIS — Z992 Dependence on renal dialysis: Secondary | ICD-10-CM | POA: Diagnosis not present

## 2019-01-25 DIAGNOSIS — N186 End stage renal disease: Secondary | ICD-10-CM | POA: Diagnosis not present

## 2019-01-25 DIAGNOSIS — E876 Hypokalemia: Secondary | ICD-10-CM | POA: Diagnosis not present

## 2019-01-25 DIAGNOSIS — M0000 Staphylococcal arthritis, unspecified joint: Secondary | ICD-10-CM | POA: Diagnosis not present

## 2019-01-25 DIAGNOSIS — N2581 Secondary hyperparathyroidism of renal origin: Secondary | ICD-10-CM | POA: Diagnosis not present

## 2019-01-27 DIAGNOSIS — Z992 Dependence on renal dialysis: Secondary | ICD-10-CM | POA: Diagnosis not present

## 2019-01-27 DIAGNOSIS — E876 Hypokalemia: Secondary | ICD-10-CM | POA: Diagnosis not present

## 2019-01-27 DIAGNOSIS — N186 End stage renal disease: Secondary | ICD-10-CM | POA: Diagnosis not present

## 2019-01-27 DIAGNOSIS — E1122 Type 2 diabetes mellitus with diabetic chronic kidney disease: Secondary | ICD-10-CM | POA: Diagnosis not present

## 2019-01-27 DIAGNOSIS — N2581 Secondary hyperparathyroidism of renal origin: Secondary | ICD-10-CM | POA: Diagnosis not present

## 2019-01-27 DIAGNOSIS — M0000 Staphylococcal arthritis, unspecified joint: Secondary | ICD-10-CM | POA: Diagnosis not present

## 2019-01-30 DIAGNOSIS — N186 End stage renal disease: Secondary | ICD-10-CM | POA: Diagnosis not present

## 2019-01-30 DIAGNOSIS — N2581 Secondary hyperparathyroidism of renal origin: Secondary | ICD-10-CM | POA: Diagnosis not present

## 2019-01-30 DIAGNOSIS — E876 Hypokalemia: Secondary | ICD-10-CM | POA: Diagnosis not present

## 2019-01-30 DIAGNOSIS — M0000 Staphylococcal arthritis, unspecified joint: Secondary | ICD-10-CM | POA: Diagnosis not present

## 2019-01-30 DIAGNOSIS — E1122 Type 2 diabetes mellitus with diabetic chronic kidney disease: Secondary | ICD-10-CM | POA: Diagnosis not present

## 2019-01-30 DIAGNOSIS — Z992 Dependence on renal dialysis: Secondary | ICD-10-CM | POA: Diagnosis not present

## 2019-01-31 DIAGNOSIS — E113511 Type 2 diabetes mellitus with proliferative diabetic retinopathy with macular edema, right eye: Secondary | ICD-10-CM | POA: Diagnosis not present

## 2019-02-01 DIAGNOSIS — M0000 Staphylococcal arthritis, unspecified joint: Secondary | ICD-10-CM | POA: Diagnosis not present

## 2019-02-01 DIAGNOSIS — N2581 Secondary hyperparathyroidism of renal origin: Secondary | ICD-10-CM | POA: Diagnosis not present

## 2019-02-01 DIAGNOSIS — N186 End stage renal disease: Secondary | ICD-10-CM | POA: Diagnosis not present

## 2019-02-01 DIAGNOSIS — E1122 Type 2 diabetes mellitus with diabetic chronic kidney disease: Secondary | ICD-10-CM | POA: Diagnosis not present

## 2019-02-01 DIAGNOSIS — Z992 Dependence on renal dialysis: Secondary | ICD-10-CM | POA: Diagnosis not present

## 2019-02-01 DIAGNOSIS — E876 Hypokalemia: Secondary | ICD-10-CM | POA: Diagnosis not present

## 2019-02-02 ENCOUNTER — Encounter: Payer: Self-pay | Admitting: Physician Assistant

## 2019-02-02 ENCOUNTER — Ambulatory Visit (INDEPENDENT_AMBULATORY_CARE_PROVIDER_SITE_OTHER): Payer: Medicare Other | Admitting: Orthopedic Surgery

## 2019-02-02 ENCOUNTER — Other Ambulatory Visit: Payer: Self-pay

## 2019-02-02 VITALS — Ht 65.0 in | Wt 148.0 lb

## 2019-02-02 DIAGNOSIS — I779 Disorder of arteries and arterioles, unspecified: Secondary | ICD-10-CM

## 2019-02-02 DIAGNOSIS — M5416 Radiculopathy, lumbar region: Secondary | ICD-10-CM | POA: Diagnosis not present

## 2019-02-02 MED ORDER — OXYCODONE-ACETAMINOPHEN 5-325 MG PO TABS: 1.0000 | ORAL_TABLET | Freq: Four times a day (QID) | ORAL | 0 refills | Status: DC | PRN

## 2019-02-02 NOTE — Progress Notes (Addendum)
Office Visit Note   Patient: Destiny Day           Date of Birth: 03-01-1951           MRN: [id number] Visit Date: 02/02/2019              Requested by: Velna Hatchet, MD [address] [place],  [place] [address] PCP: Velna Hatchet, MD  Chief Complaint  Patient presents with   Lower Back - Pain      HPI: Patient is a 68 year old woman who presents complaining of right-sided buttocks pain that radiates down into her right thigh down to her knee.  She states she has difficulty sleeping standing and walking.  Denies any motor function changes.  She does wear double upright brace on the right.  Of note patient states she was previously scheduled for epidural steroid injection of her cervical spine but this seemed to resolve on its own.  Assessment & Plan: Visit Diagnoses:  1. Radiculopathy, lumbar region     Plan: Will request an MRI scan to further evaluate the lumbar spine anticipate an epidural steroid injection would be beneficial.  Recommend she increase her Neurontin to 300 mg nightly and a prescription was called in for Percocet.  Follow-Up Instructions: Return in about 2 weeks (around 02/16/2019), or if symptoms worsen or fail to improve.   Ortho Exam  Patient is alert, oriented, no adenopathy, well-dressed, normal affect, normal respiratory effort. Examination patient sits leaning to the left side.  She has a negative straight leg raise on the right.  She has no focal motor weakness and any of the motor groups on the right lower extremity.  Patient has difficulty getting from a sitting to a standing position she uses a rolling walker.  Review of patient's radiographs shows severe peripheral vascular disease with calcification of the aorta but no aneurysm from her last study in March.  There is degenerative disc disease but no significant joint space narrowing. Imaging: No results found. No images are attached to the encounter.  Labs: Lab Results  Component  Value Date   HGBA1C 7.2 (H) 05/06/2018   HGBA1C 6.4 (H) 12/31/2017   HGBA1C 5.0 09/03/2017   REPTSTATUS 05/12/2018 FINAL 05/07/2018   GRAMSTAIN  05/06/2018    MODERATE WBC PRESENT, PREDOMINANTLY PMN NO ORGANISMS SEEN    CULT  05/07/2018    NO GROWTH 5 DAYS Performed at [facility], [place] [address], [place], [place] [address]    [place] 05/06/2018     Lab Results  Component Value Date   ALBUMIN 2.2 (L) 06/19/2018   ALBUMIN 2.2 (L) 06/11/2018   ALBUMIN 2.2 (L) 05/09/2018    Lab Results  Component Value Date   MG 1.9 05/07/2018   MG 1.9 01/23/2018   MG 1.9 08/27/2017   No results found for: VD25OH  No results found for: PREALBUMIN CBC EXTENDED Latest Ref Rng & Units 12/20/2018 12/15/2018 11/17/2018  WBC 4.0 - 10.5 K/uL - 5.9 -  RBC 3.87 - 5.11 MIL/uL - 3.92 -  HGB 12.0 - 15.0 g/dL 12.2 12.3 12.2  HCT 36.0 - 46.0 % 36.0 38.9 36.0  PLT 150 - 400 K/uL - 260 -  NEUTROABS 1.7 - 7.7 K/uL - - -  LYMPHSABS 0.7 - 4.0 K/uL - - -     Body mass index is 24.63 kg/m.  Orders:  No orders of the defined types were placed in this encounter.  No orders of the defined types  were placed in this encounter.    Procedures: No procedures performed  Clinical Data: No additional findings.  ROS:  All other systems negative, except as noted in the HPI. Review of Systems  Objective: Vital Signs: Ht 5\' 5"  (1.651 m)    Wt 148 lb (67.1 kg)    BMI 24.63 kg/m   Specialty Comments:  No specialty comments available.  PMFS History: Patient Active Problem List   Diagnosis Date Noted   Fluid overload 06/18/2018   Old bucket handle tear of lateral meniscus of right knee    Old peripheral tear of medial meniscus of right knee    Septic arthritis of knee, right ([place]) 05/06/2018   Degenerative tear of posterior horn of medial meniscus, right    Old complex tear of lateral meniscus of right knee    Loose body in knee, right    Acute pulmonary edema  ([place]) 02/21/2018   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[place]) 02/03/2018   End-stage renal disease on hemodialysis (HCC)    Respiratory failure ([place]) 01/21/2018   Achilles tendon contracture, right 11/09/2017   Labile blood glucose    Anemia of infection and chronic disease    Essential hypertension    Black stool    Right sided weakness    Subdural hemorrhage following injury ([place]) 08/30/2017   Diabetic peripheral neuropathy (HCC)    Chronic obstructive pulmonary disease ([place])    Seizure prophylaxis    Subdural hematoma ([place]) 08/24/2017   Traumatic subdural hematoma ([place]) 08/23/2017   Fall    SDH (subdural hematoma) (HCC)    Acute respiratory failure with hypoxia ([place])    Diabetes mellitus type 2 in nonobese (HCC)    Leukocytosis    Labile blood pressure    Generalized anxiety disorder    Debility 08/16/2017   Amputated great toe of right foot ([place])    Amputated toe of left foot ([place])    Post-operative pain    ESRD on dialysis ([place])    Diabetes mellitus ([place])    Anxiety state    Benign essential HTN    Acute blood loss anemia    Anemia of chronic disease    Idiopathic chronic venous hypertension of both lower extremities with inflammation 10/13/2016   S/P transmetatarsal amputation of foot, left ([place]) 09/21/2016   Onychomycosis 08/15/2016   PAD (peripheral artery disease) ([place]) 07/08/2016   Atherosclerosis of native artery of left lower extremity with gangrene ([place]) 06/23/2016   GERD (gastroesophageal reflux disease) 10/07/2015   ARF (acute renal failure) ([place])    Hypothermia 06/12/2015   Acute on chronic renal failure ([place]) 06/12/2015   CHF (congestive heart failure) ([place]) 07/30/2014   CKD (chronic kidney disease) stage 4, GFR 15-29 ml/min ([place]) 06/27/2014   Hypertensive heart disease 05/25/2013   CKD (chronic kidney disease) stage 3, GFR 30-59 ml/min (HCC) 05/25/2013   Pulmonary edema 05/23/2013   Type 2  diabetes mellitus with diabetic nephropathy ([place]) 05/23/2013   Type 2 diabetes mellitus with hyperosmolar nonketotic hyperglycemia ([place]) 04/13/2013   Acute on chronic diastolic heart failure ([place]) 04/13/2013   UGI bleed 03/31/2013   Hypokalemia 08/24/2012   Type II or unspecified type diabetes mellitus without mention of complication, not stated as uncontrolled 12/27/2007   HLD (hyperlipidemia) 12/27/2007   Allergic rhinitis due to pollen 12/27/2007   Asthma 12/27/2007   Past Medical History:  Diagnosis Date   Abdominal bruit    Anemia    Anxiety    Arthritis  Osteoarthritis   Asthma    Cervical disc disease    "pinced nerve"   CHF (congestive heart failure) (HCC)    Complication of anesthesia    " after I got home from my last procedure, I started itching."   Diabetes mellitus    Type II   Diverticulitis    ESRD (end stage renal disease) ([place])    dialysis - M/W/F- [place]   GERD (gastroesophageal reflux disease)    from medications   GI bleed 03/31/2013   Head injury 07/2017   History of hiatal hernia    Hyperlipidemia    Hypertension    Neuropathy    left leg   Osteoporosis    Peripheral vascular disease ([place])    Pneumonia    "very young" and a few years ago   PONV (postoperative nausea and vomiting)    Seasonal allergies    Shortness of breath dyspnea    WIth exertion, when fluid builds   Sleep apnea    can't afford cpap     Family History  Problem Relation Age of Onset   Other Mother        not sure of cause of death   Diabetes Father    Pancreatic cancer Maternal Grandmother    Colon cancer Neg Hx     Past Surgical History:  Procedure Laterality Date   A/V SHUNTOGRAM N/A 09/22/2016   Procedure: A/V Shuntogram - left arm;  Surgeon: Serafina Mitchell, MD;  Location: [facility];  Service: Cardiovascular;  Laterality: N/A;   A/V SHUNTOGRAM N/A 03/22/2018   Procedure: A/V SHUNTOGRAM - left arm;  Surgeon: Serafina Mitchell, MD;  Location: [facility];  Service: Cardiovascular;  Laterality: N/A;   A/V SHUNTOGRAM Left 11/17/2018   Procedure: A/V SHUNTOGRAM;  Surgeon: Angelia Mould, MD;  Location: [place];  Service: Cardiovascular;  Laterality: Left;   ABDOMINAL HYSTERECTOMY  1993`   AMPUTATION Left 09/01/2016   Procedure: LEFT FOOT TRANSMETATARSAL AMPUTATION;  Surgeon: Newt Minion, MD;  Location: [place] [facility];  Service: Orthopedics;  Laterality: Left;   AMPUTATION Right 08/11/2017   Procedure: RIGHT GREAT TOE AMPUTATION DIGIT;  Surgeon: Rosetta Posner, MD;  Location: [facility];  Service: Vascular;  Laterality: Right;   AMPUTATION Right 12/31/2017   Procedure: RIGHT TRANSMETATARSAL AMPUTATION;  Surgeon: Newt Minion, MD;  Location: [facility];  Service: Orthopedics;  Laterality: Right;   AV FISTULA PLACEMENT Left 04/21/2016   Procedure: INSERTION OF ARTERIOVENOUS (AV) GORE-TEX GRAFT ARM LEFT;  Surgeon: Elam Dutch, MD;  Location: [facility];  Service: Vascular;  Laterality: Left;   BASCILIC VEIN TRANSPOSITION Left 07/10/2014   Procedure: BASCILIC VEIN TRANSPOSITION;  Surgeon: Angelia Mould, MD;  Location: [facility];  Service: Vascular;  Laterality: Left;   [place] Right 11/08/2014   Procedure: FIRST STAGE BASILIC VEIN TRANSPOSITION;  Surgeon: Angelia Mould, MD;  Location: [facility];  Service: Vascular;  Laterality: Right;   [place] Right 01/18/2015   Procedure: SECOND STAGE BASILIC VEIN TRANSPOSITION;  Surgeon: Angelia Mould, MD;  Location: [facility];  Service: Vascular;  Laterality: Right;   CRANIOTOMY N/A 08/23/2017   Procedure: CRANIOTOMY HEMATOMA EVACUATION SUBDURAL;  Surgeon: Ashok Pall, MD;  Location: [facility];  Service: Neurosurgery;  Laterality: N/A;   CRANIOTOMY Left 08/24/2017   Procedure: CRANIOTOMY FOR RECURRENT ACUTE SUBDURAL HEMATOMA;  Surgeon: Ashok Pall, MD;  Location: [facility];  Service: Neurosurgery;  Laterality: Left;  ESOPHAGOGASTRODUODENOSCOPY N/A 03/31/2013   Procedure: ESOPHAGOGASTRODUODENOSCOPY (EGD);  Surgeon: Gatha Mayer, MD;  [facility] END[facility];  Service: Endoscopy;  Laterality: N/A;   EYE SURGERY     laser surgery   FEMORAL-POPLITEAL BYPASS GRAFT Left 07/08/2016   Procedure: LEFT  FEMORAL-BELOW KNEE POPLITEAL ARTERY BYPASS GRAFT USING 6MM X 80 CM PROPATEN GORETEX GRAFT WITH RINGS.;  Surgeon: Rosetta Posner, MD;  Location: [facility];  Service: Vascular;  Laterality: Left;   FEMORAL-POPLITEAL BYPASS GRAFT Right 08/11/2017   Procedure: RIGHT FEMORAL TO BELOW KNEE POPLITEAL ARTERKY  BYPASS GRAFT USING 6MM RINGED PROPATEN GRAFT;  Surgeon: Rosetta Posner, MD;  Location: [facility];  Service: Vascular;  Laterality: Right;   FISTULOGRAM Left 10/29/2014   Procedure: FISTULOGRAM;  Surgeon: Angelia Mould, MD;  Location: [facility] LAB;  Service: Cardiovascular;  Laterality: Left;   GAS/FLUID EXCHANGE Left 12/20/2018   Procedure: AIR GAS EXCHANGE;  Surgeon: Jalene Mullet, MD;  Location: [facility];  Service: Ophthalmology;  Laterality: Left;   KNEE ARTHROSCOPY Right 05/06/2018   Procedure: RIGHT KNEE ARTHROSCOPY;  Surgeon: Newt Minion, MD;  Location: [facility];  Service: Orthopedics;  Laterality: Right;   KNEE ARTHROSCOPY Right 06/10/2018   Procedure: RIGHT KNEE ARTHROSCOPY AND DEBRIDEMENT;  Surgeon: Newt Minion, MD;  Location: [place];  Service: Orthopedics;  Laterality: Right;   LIGATION OF ARTERIOVENOUS  FISTULA Left 04/21/2016   Procedure: LIGATION OF ARTERIOVENOUS  FISTULA LEFT ARM;  Surgeon: Elam Dutch, MD;  Location: [facility];  Service: Vascular;  Laterality: Left;   LOWER EXTREMITY ANGIOGRAPHY N/A 04/06/2017   Procedure: Lower Extremity Angiography - Right;  Surgeon: Serafina Mitchell, MD;  Location: [facility];  Service: Cardiovascular;  Laterality: N/A;   MEMBRANE PEEL Left 12/20/2018   Procedure: MEMBRANE PEEL;  Surgeon: Jalene Mullet, MD;  Location: [place] [facility];  Service: Ophthalmology;   Laterality: Left;   PATCH ANGIOPLASTY Right 01/18/2015   Procedure: BASILIC VEIN PATCH ANGIOPLASTY USING VASCUGUARD PATCH;  Surgeon: Angelia Mould, MD;  Location: [facility];  Service: Vascular;  Laterality: Right;   PERIPHERAL VASCULAR BALLOON ANGIOPLASTY  09/22/2016   Procedure: Peripheral Vascular Balloon Angioplasty;  Surgeon: Serafina Mitchell, MD;  Location: [facility] [facility];  Service: Cardiovascular;;  Lt. Fistula   PERIPHERAL VASCULAR BALLOON ANGIOPLASTY Left 03/22/2018   Procedure: PERIPHERAL VASCULAR BALLOON ANGIOPLASTY;  Surgeon: Serafina Mitchell, MD;  Location: [facility];  Service: Cardiovascular;  Laterality: Left;  Arm shunt   PERIPHERAL VASCULAR CATHETERIZATION N/A 06/23/2016   Procedure: Abdominal Aortogram w/Lower Extremity;  Surgeon: Serafina Mitchell, MD;  Location: [facility];  Service: Cardiovascular;  Laterality: N/A;   PERIPHERAL VASCULAR CATHETERIZATION  06/23/2016   Procedure: Peripheral Vascular Intervention;  Surgeon: Serafina Mitchell, MD;  Location: [facility];  Service: Cardiovascular;;  lt common and external illiac artery   PHOTOCOAGULATION WITH LASER Left 12/20/2018   Procedure: PHOTOCOAGULATION WITH LASER;  Surgeon: Jalene Mullet, MD;  Location: [facility];  Service: Ophthalmology;  Laterality: Left;   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

## 2019-02-03 DIAGNOSIS — E876 Hypokalemia: Secondary | ICD-10-CM | POA: Diagnosis not present

## 2019-02-03 DIAGNOSIS — Z992 Dependence on renal dialysis: Secondary | ICD-10-CM | POA: Diagnosis not present

## 2019-02-03 DIAGNOSIS — E1122 Type 2 diabetes mellitus with diabetic chronic kidney disease: Secondary | ICD-10-CM | POA: Diagnosis not present

## 2019-02-03 DIAGNOSIS — M0000 Staphylococcal arthritis, unspecified joint: Secondary | ICD-10-CM | POA: Diagnosis not present

## 2019-02-03 DIAGNOSIS — N2581 Secondary hyperparathyroidism of renal origin: Secondary | ICD-10-CM | POA: Diagnosis not present

## 2019-02-03 DIAGNOSIS — N186 End stage renal disease: Secondary | ICD-10-CM | POA: Diagnosis not present

## 2019-02-06 DIAGNOSIS — E1122 Type 2 diabetes mellitus with diabetic chronic kidney disease: Secondary | ICD-10-CM | POA: Diagnosis not present

## 2019-02-06 DIAGNOSIS — N186 End stage renal disease: Secondary | ICD-10-CM | POA: Diagnosis not present

## 2019-02-06 DIAGNOSIS — M0000 Staphylococcal arthritis, unspecified joint: Secondary | ICD-10-CM | POA: Diagnosis not present

## 2019-02-06 DIAGNOSIS — Z992 Dependence on renal dialysis: Secondary | ICD-10-CM | POA: Diagnosis not present

## 2019-02-06 DIAGNOSIS — N2581 Secondary hyperparathyroidism of renal origin: Secondary | ICD-10-CM | POA: Diagnosis not present

## 2019-02-06 DIAGNOSIS — E876 Hypokalemia: Secondary | ICD-10-CM | POA: Diagnosis not present

## 2019-02-08 DIAGNOSIS — E1122 Type 2 diabetes mellitus with diabetic chronic kidney disease: Secondary | ICD-10-CM | POA: Diagnosis not present

## 2019-02-08 DIAGNOSIS — N2581 Secondary hyperparathyroidism of renal origin: Secondary | ICD-10-CM | POA: Diagnosis not present

## 2019-02-08 DIAGNOSIS — Z992 Dependence on renal dialysis: Secondary | ICD-10-CM | POA: Diagnosis not present

## 2019-02-08 DIAGNOSIS — N186 End stage renal disease: Secondary | ICD-10-CM | POA: Diagnosis not present

## 2019-02-08 DIAGNOSIS — E876 Hypokalemia: Secondary | ICD-10-CM | POA: Diagnosis not present

## 2019-02-08 DIAGNOSIS — M0000 Staphylococcal arthritis, unspecified joint: Secondary | ICD-10-CM | POA: Diagnosis not present

## 2019-02-09 DIAGNOSIS — E113512 Type 2 diabetes mellitus with proliferative diabetic retinopathy with macular edema, left eye: Secondary | ICD-10-CM | POA: Diagnosis not present

## 2019-02-10 DIAGNOSIS — M0000 Staphylococcal arthritis, unspecified joint: Secondary | ICD-10-CM | POA: Diagnosis not present

## 2019-02-10 DIAGNOSIS — E1122 Type 2 diabetes mellitus with diabetic chronic kidney disease: Secondary | ICD-10-CM | POA: Diagnosis not present

## 2019-02-10 DIAGNOSIS — E876 Hypokalemia: Secondary | ICD-10-CM | POA: Diagnosis not present

## 2019-02-10 DIAGNOSIS — Z992 Dependence on renal dialysis: Secondary | ICD-10-CM | POA: Diagnosis not present

## 2019-02-10 DIAGNOSIS — N2581 Secondary hyperparathyroidism of renal origin: Secondary | ICD-10-CM | POA: Diagnosis not present

## 2019-02-10 DIAGNOSIS — N186 End stage renal disease: Secondary | ICD-10-CM | POA: Diagnosis not present

## 2019-02-13 DIAGNOSIS — E876 Hypokalemia: Secondary | ICD-10-CM | POA: Diagnosis not present

## 2019-02-13 DIAGNOSIS — N2581 Secondary hyperparathyroidism of renal origin: Secondary | ICD-10-CM | POA: Diagnosis not present

## 2019-02-13 DIAGNOSIS — N186 End stage renal disease: Secondary | ICD-10-CM | POA: Diagnosis not present

## 2019-02-13 DIAGNOSIS — Z992 Dependence on renal dialysis: Secondary | ICD-10-CM | POA: Diagnosis not present

## 2019-02-13 DIAGNOSIS — E1122 Type 2 diabetes mellitus with diabetic chronic kidney disease: Secondary | ICD-10-CM | POA: Diagnosis not present

## 2019-02-13 DIAGNOSIS — M0000 Staphylococcal arthritis, unspecified joint: Secondary | ICD-10-CM | POA: Diagnosis not present

## 2019-02-15 DIAGNOSIS — M0000 Staphylococcal arthritis, unspecified joint: Secondary | ICD-10-CM | POA: Diagnosis not present

## 2019-02-15 DIAGNOSIS — N186 End stage renal disease: Secondary | ICD-10-CM | POA: Diagnosis not present

## 2019-02-15 DIAGNOSIS — N2581 Secondary hyperparathyroidism of renal origin: Secondary | ICD-10-CM | POA: Diagnosis not present

## 2019-02-15 DIAGNOSIS — E1122 Type 2 diabetes mellitus with diabetic chronic kidney disease: Secondary | ICD-10-CM | POA: Diagnosis not present

## 2019-02-15 DIAGNOSIS — Z992 Dependence on renal dialysis: Secondary | ICD-10-CM | POA: Diagnosis not present

## 2019-02-15 DIAGNOSIS — E876 Hypokalemia: Secondary | ICD-10-CM | POA: Diagnosis not present

## 2019-02-16 ENCOUNTER — Encounter (HOSPITAL_COMMUNITY): Payer: Self-pay

## 2019-02-16 ENCOUNTER — Other Ambulatory Visit: Payer: Self-pay

## 2019-02-16 ENCOUNTER — Ambulatory Visit (HOSPITAL_COMMUNITY)
Admission: EM | Admit: 2019-02-16 | Discharge: 2019-02-16 | Disposition: A | Discharge status: Home / Self Care | Payer: Medicare Other | Attending: Internal Medicine | Admitting: Internal Medicine

## 2019-02-16 DIAGNOSIS — L89151 Pressure ulcer of sacral region, stage 1: Secondary | ICD-10-CM

## 2019-02-16 DIAGNOSIS — L84 Corns and callosities: Secondary | ICD-10-CM

## 2019-02-16 MED ORDER — SILVER SULFADIAZINE 1 % EX CREA: 1.0000 "application " | TOPICAL_CREAM | Freq: Every day | CUTANEOUS | 0 refills | Status: DC

## 2019-02-16 NOTE — ED Triage Notes (Signed)
Pt presents with a rash area on the palm of right hand that was raw and itchy; pt also complains of raw patch on her buttocks.

## 2019-02-16 NOTE — ED Provider Notes (Signed)
Destiny Day    CSN: [id number] Arrival date & time: 02/16/19  1409     History   Chief Complaint Chief Complaint  Patient presents with  . Rash    HPI Destiny Day is a 68 y.o. 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  Patient says symptoms have been persistent.  She has been trying Neosporin on her hand as well as daily dressing changes on the back with no improvement.  She has a sedentary lifestyle.  No fever or chills.  No nausea vomiting.  HPI  Past Medical History:  Diagnosis Date  . Abdominal bruit   . Anemia   . Anxiety   . Arthritis    Osteoarthritis  . Asthma   . Cervical disc disease    "pinced nerve"  . CHF (congestive heart failure) ([place])   . Complication of anesthesia    " after I got home from my last procedure, I started itching."  . Diabetes mellitus    Type II  . Diverticulitis   . ESRD (end stage renal disease) ([place])    dialysis - M/W/F- [place]  . GERD (gastroesophageal reflux disease)    from medications  . GI bleed 03/31/2013  . Head injury 07/2017  . History of hiatal hernia   . Hyperlipidemia   . Hypertension   . Neuropathy    left leg  . Osteoporosis   . Peripheral vascular disease ([place])   . Pneumonia    "very young" and a few years ago  . PONV (postoperative nausea and vomiting)   . Seasonal allergies   . Shortness of breath dyspnea    WIth exertion, when fluid builds  . Sleep apnea    can't afford cpap     Patient Active Problem List   Diagnosis Date Noted  . Fluid overload 06/18/2018  . Old bucket handle tear of lateral meniscus of right knee   . Old peripheral tear of medial meniscus of right knee   . Septic arthritis of knee, right ([place]) 05/06/2018  . Degenerative tear  of posterior horn of medial meniscus, right   . Old complex tear of lateral meniscus of right knee   . Loose body in knee, right   . Acute pulmonary edema ([place]) 02/21/2018  . 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[place]) 02/03/2018  . End-stage renal disease on hemodialysis ([place])   . Respiratory failure ([place]) 01/21/2018  . Achilles tendon contracture, right 11/09/2017  . Labile blood glucose   . Anemia of infection and chronic disease   . Essential hypertension   . Black stool   . Right sided weakness   . Subdural hemorrhage following injury ([place]) 08/30/2017  . Diabetic peripheral neuropathy ([place])   . Chronic obstructive pulmonary disease ([place])   . Seizure prophylaxis   . Subdural hematoma ([place]) 08/24/2017  . Traumatic subdural hematoma ([place]) 08/23/2017  . Fall   . SDH (subdural hematoma) ([place])   . Acute respiratory failure with hypoxia ([place])   . Diabetes mellitus type 2 in nonobese (HCC)   . Leukocytosis   . Labile blood pressure   . Generalized anxiety disorder   . Debility 08/16/2017  . Amputated great toe of right foot ([place])   . Amputated toe  of left foot ([place])   . Post-operative pain   . ESRD on dialysis ([place])   . Diabetes mellitus ([place])   . Anxiety state   . Benign essential HTN   . Acute blood loss anemia   . Anemia of chronic disease   . Idiopathic chronic venous hypertension of both lower extremities with inflammation 10/13/2016  . S/P transmetatarsal amputation of foot, left ([place]) 09/21/2016  . Onychomycosis 08/15/2016  . PAD (peripheral artery disease) ([place]) 07/08/2016  . Atherosclerosis of native artery of left lower extremity with gangrene ([place]) 06/23/2016  . GERD (gastroesophageal reflux disease) 10/07/2015  . ARF (acute renal failure) ([place])   . Hypothermia 06/12/2015  . Acute on chronic renal failure ([place]) 06/12/2015  . CHF (congestive heart failure) ([place]) 07/30/2014  . CKD (chronic kidney disease) stage 4, GFR 15-29 ml/min (HCC) 06/27/2014  .  Hypertensive heart disease 05/25/2013  . CKD (chronic kidney disease) stage 3, GFR 30-59 ml/min (HCC) 05/25/2013  . Pulmonary edema 05/23/2013  . Type 2 diabetes mellitus with diabetic nephropathy ([place]) 05/23/2013  . Type 2 diabetes mellitus with hyperosmolar nonketotic hyperglycemia ([place]) 04/13/2013  . Acute on chronic diastolic heart failure ([place]) 04/13/2013  . UGI bleed 03/31/2013  . Hypokalemia 08/24/2012  . Type II or unspecified type diabetes mellitus without mention of complication, not stated as uncontrolled 12/27/2007  . HLD (hyperlipidemia) 12/27/2007  . Allergic rhinitis due to pollen 12/27/2007  . Asthma 12/27/2007    Past Surgical History:  Procedure Laterality Date  . A/V SHUNTOGRAM N/A 09/22/2016   Procedure: A/V Shuntogram - left arm;  Surgeon: Serafina Mitchell, MD;  Location: [facility];  Service: Cardiovascular;  Laterality: N/A;  . A/V SHUNTOGRAM N/A 03/22/2018   Procedure: A/V SHUNTOGRAM - left arm;  Surgeon: Serafina Mitchell, MD;  Location: [facility];  Service: Cardiovascular;  Laterality: N/A;  . A/V SHUNTOGRAM Left 11/17/2018   Procedure: A/V SHUNTOGRAM;  Surgeon: Angelia Mould, MD;  Location: [facility];  Service: Cardiovascular;  Laterality: Left;  . ABDOMINAL HYSTERECTOMY  1993`  . AMPUTATION Left 09/01/2016   Procedure: LEFT FOOT TRANSMETATARSAL AMPUTATION;  Surgeon: Newt Minion, MD;  Location: [facility];  Service: Orthopedics;  Laterality: Left;  . AMPUTATION Right 08/11/2017   Procedure: RIGHT GREAT TOE AMPUTATION DIGIT;  Surgeon: Rosetta Posner, MD;  Location: [facility];  Service: Vascular;  Laterality: Right;  . AMPUTATION Right 12/31/2017   Procedure: RIGHT TRANSMETATARSAL AMPUTATION;  Surgeon: Newt Minion, MD;  Location: [facility];  Service: Orthopedics;  Laterality: Right;  . AV FISTULA PLACEMENT Left 04/21/2016   Procedure: INSERTION OF ARTERIOVENOUS (AV) GORE-TEX GRAFT ARM LEFT;  Surgeon: Elam Dutch, MD;  Location: [facility];   Service: Vascular;  Laterality: Left;  . [place] TRANSPOSITION Left 07/10/2014   Procedure: BASCILIC VEIN TRANSPOSITION;  Surgeon: Angelia Mould, MD;  Location: [facility];  Service: Vascular;  Laterality: Left;  . BASCILIC VEIN TRANSPOSITION Right 11/08/2014   Procedure: FIRST STAGE BASILIC VEIN TRANSPOSITION;  Surgeon: Angelia Mould, MD;  Location: [facility];  Service: Vascular;  Laterality: Right;  . BASCILIC VEIN TRANSPOSITION Right 01/18/2015   Procedure: SECOND STAGE BASILIC VEIN TRANSPOSITION;  Surgeon: Angelia Mould, MD;  Location: [facility];  Service: Vascular;  Laterality: Right;  . CRANIOTOMY N/A 08/23/2017   Procedure: CRANIOTOMY HEMATOMA EVACUATION SUBDURAL;  Surgeon: Ashok Pall, MD;  Location: [facility];  Service: Neurosurgery;  Laterality: N/A;  . CRANIOTOMY Left 08/24/2017  Procedure: CRANIOTOMY FOR RECURRENT ACUTE SUBDURAL HEMATOMA;  Surgeon: Ashok Pall, MD;  Location: [facility];  Service: Neurosurgery;  Laterality: Left;  . ESOPHAGOGASTRODUODENOSCOPY N/A 03/31/2013   Procedure: ESOPHAGOGASTRODUODENOSCOPY (EGD);  Surgeon: Gatha Mayer, MD;  Location: [facility] ENDOSCOPY;  Service: Endoscopy;  Laterality: N/A;  . EYE SURGERY     laser surgery  . FEMORAL-POPLITEAL BYPASS GRAFT Left 07/08/2016   Procedure: LEFT  FEMORAL-BELOW KNEE POPLITEAL ARTERY BYPASS GRAFT USING 6MM X 80 CM PROPATEN GORETEX GRAFT WITH RINGS.;  Surgeon: Rosetta Posner, MD;  Location: [facility];  Service: Vascular;  Laterality: Left;  . FEMORAL-POPLITEAL BYPASS GRAFT Right 08/11/2017   Procedure: RIGHT FEMORAL TO BELOW KNEE POPLITEAL ARTERKY  BYPASS GRAFT USING 6MM RINGED PROPATEN GRAFT;  Surgeon: Rosetta Posner, MD;  Location: [facility];  Service: Vascular;  Laterality: Right;  . FISTULOGRAM Left 10/29/2014   Procedure: FISTULOGRAM;  Surgeon: Angelia Mould, MD;  Location: [facility] C[facility] LAB;  Service: Cardiovascular;  Laterality: Left;  [place] Kitchen GAS/FLUID EXCHANGE Left 12/20/2018   Procedure: AIR GAS EXCHANGE;  Surgeon: Jalene Mullet, MD;  Location: [facility];  Service: Ophthalmology;  Laterality: Left;  . KNEE ARTHROSCOPY Right 05/06/2018   Procedure: RIGHT KNEE ARTHROSCOPY;  Surgeon: Newt Minion, MD;  Location: [facility];  Service: Orthopedics;  Laterality: Right;  . KNEE ARTHROSCOPY Right 06/10/2018   Procedure: RIGHT KNEE ARTHROSCOPY AND DEBRIDEMENT;  Surgeon: Newt Minion, MD;  Location: [facility];  Service: Orthopedics;  Laterality: Right;  . LIGATION OF ARTERIOVENOUS  FISTULA Left 04/21/2016   Procedure: LIGATION OF ARTERIOVENOUS  FISTULA LEFT ARM;  Surgeon: Elam Dutch, MD;  Location: [facility];  Service: Vascular;  Laterality: Left;  . LOWER EXTREMITY ANGIOGRAPHY N/A 04/06/2017   Procedure: Lower Extremity Angiography - Right;  Surgeon: Serafina Mitchell, MD;  Location: [facility];  Service: Cardiovascular;  Laterality: N/A;  . MEMBRANE PEEL Left 12/20/2018   Procedure: MEMBRANE PEEL;  Surgeon: Jalene Mullet, MD;  Location: [facility];  Service: Ophthalmology;  Laterality: Left;  . PATCH ANGIOPLASTY Right 01/18/2015   Procedure: BASILIC VEIN PATCH ANGIOPLASTY USING VASCUGUARD PATCH;  Surgeon: Angelia Mould, MD;  Location: [facility];  Service: Vascular;  Laterality: Right;  . PERIPHERAL VASCULAR BALLOON ANGIOPLASTY  09/22/2016   Procedure: Peripheral Vascular Balloon Angioplasty;  Surgeon: Serafina Mitchell, MD;  Location: [facility];  Service: Cardiovascular;;  Lt. Fistula  . PERIPHERAL VASCULAR BALLOON ANGIOPLASTY Left 03/22/2018   Procedure: PERIPHERAL VASCULAR BALLOON ANGIOPLASTY;  Surgeon: Serafina Mitchell, MD;  Location: [facility];  Service: Cardiovascular;  Laterality: Left;  Arm shunt  . PERIPHERAL VASCULAR CATHETERIZATION N/A 06/23/2016   Procedure: Abdominal Aortogram w/Lower Extremity;  Surgeon: Serafina Mitchell, MD;  Location: [facility];  Service: Cardiovascular;  Laterality: N/A;  . PERIPHERAL VASCULAR CATHETERIZATION  06/23/2016   Procedure: Peripheral Vascular Intervention;   Surgeon: Serafina Mitchell, MD;  Location: [facility];  Service: Cardiovascular;;  lt common and external illiac artery  . PHOTOCOAGULATION WITH LASER Left 12/20/2018   Procedure: PHOTOCOAGULATION WITH LASER;  Surgeon: Jalene Mullet, MD;  Location: [facility];  Service: Ophthalmology;  Laterality: Left;  . 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      Home Medications  Prior to Admission medications   Medication Sig Start Date End Date Taking? Authorizing Provider  albuterol (PROVENTIL HFA;VENTOLIN HFA) 108 (90 Base) MCG/ACT inhaler Inhale 1-2 puffs into the lungs every 6 (six) hours as needed for wheezing or shortness of breath. 01/23/18   Allie Bossier, MD  amLODipine (NORVASC) 10 MG tablet Take 10 mg by mouth at bedtime.    [provider]  aspirin EC 81 MG tablet Take 81 mg by mouth daily.    [provider]  atorvastatin (LIPITOR) 20 MG tablet Take 1 tablet (20 mg total) by mouth daily. 01/07/19   Adrian Prows, MD  budesonide-formoterol (SYMBICORT) 160-4.5 MCG/ACT inhaler Inhale 1 puff into the lungs daily as needed (respiratory issues.).     [provider]  carvedilol (COREG) 12.5 MG tablet Take 12.5 mg by mouth See admin instructions. Take 1 tablet (12.5 mg) by mouth once daily in the evening on Mondays, Wednesdays, & Fridays. (Dialysis) Take 1 tablet (12.5 mg) by mouth twice daily on Sundays, Tuesdays, Thursdays, & Saturdays. (Non Dialysis)    [provider]  cinacalcet (SENSIPAR) 90 MG tablet Take 90 mg by mouth daily. Take 1 tablet (90 mg) by mouth in the evening on Mondays, Wednesdays, & Fridays (dialysis) Take 1 tablet (90 mg) by mouth twice daily on Sundays, Tuesdays, Thursdays, Saturdays. (non dialysis) 11/28/18   [provider]  diazepam (VALIUM) 5 MG tablet Take 5 mg by mouth 2 (two) times daily as needed for anxiety.    [provider]  ferric citrate (AURYXIA) 1 GM 210 MG(Fe) tablet Take 210-420 mg by mouth See admin instructions. Take 2 tablets (420 mg) by mouth with each meal & take 1 tablet (210 mg) by mouth with snacks.    [provider]  fluticasone (FLONASE) 50 MCG/ACT nasal spray Place 1 spray into both nostrils daily as needed for allergies or rhinitis.    [provider]  gabapentin (NEURONTIN) 100 MG capsule TAKE 1 CAPSULE (100 MG TOTAL) BY MOUTH AT BEDTIME. WHEN NECESSARY FOR NEUROPATHY PAIN 12/13/18   Newt Minion, MD  guaiFENesin (ROBITUSSIN) 100 MG/5ML liquid Take 100 mg by mouth every 12 (twelve) hours as needed for cough.    [provider]  hydrocerin (EUCERIN) CREA Apply 016 application topically 2 (two) times daily. Patient taking differently: Apply 1 application topically 2 (two) times daily.  09/16/17   Love, Ivan Anchors, PA-C  insulin NPH Human (HUMULIN N,NOVOLIN N) 100 UNIT/ML injection Inject 0.1 mLs (10 Units total) into the skin at bedtime. 01/23/18   Allie Bossier, MD  insulin regular (NOVOLIN R,HUMULIN R) 100 units/mL injection Inject 3-8 Units into the skin See admin instructions. 3 times daily with meals on Sundays, Tuesdays, Thursdays, & Saturdays per sliding scale    [provider]  isosorbide-hydrALAZINE (BIDIL) 20-37.5 MG tablet Take 1 tablet by mouth 3 (three) times daily. On NON dialysis days pt takes 1 tab 3 times daily, and on Dialysis days, pt takes 1 tab every evning Patient taking differently: Take 1 tablet by mouth See admin instructions. Take 1 tablet by mouth in the evening on Mondays, Wednesdays, & Fridays. (Dialysis) Take 1 tablet 3 times daily on Sundays, Tuesdays, Thursdays, & Saturdays. (Non-Dialysis) 12/06/18   Miquel Dunn, NP  lidocaine-prilocaine (EMLA) cream Apply 1 application topically 3 (three) times  a week. 1-2 hours prior to dialysis 01/07/18   [provider]  loratadine (CLARITIN) 10 MG tablet Take 10 mg by mouth daily  as needed for allergies.    [provider]  multivitamin (RENA-VIT) TABS tablet Take 1 tablet by mouth daily.  01/24/15   [provider]  omeprazole (PRILOSEC) 20 MG capsule Take 20 mg by mouth daily.     [provider]  oxyCODONE-acetaminophen (PERCOCET/ROXICET) 5-325 MG tablet Take 1 tablet by mouth every 6 (six) hours as needed for severe pain. 02/02/19   Newt Minion, MD  senna (SENOKOT) 8.6 MG tablet Take 1 tablet by mouth as needed for constipation.    [provider]  silver sulfADIAZINE (SILVADENE) 1 % cream Apply 1 application topically daily. 02/16/19   Chase Picket, MD  Vitamin D, Ergocalciferol, (DRISDOL) 1.25 MG (50000 UT) CAPS capsule Take 50,000 Units by mouth every Sunday. Takes on Sunday     [provider]    Family History Family History  Problem Relation Age of Onset  . Other Mother        not sure of cause of death  . Diabetes Father   . Pancreatic cancer Maternal Grandmother   . Colon cancer Neg Hx     Social History Social History   Tobacco Use  . Smoking status: Former Smoker    Packs/day: 0.35    Years: 40.00    Pack years: 14.00    Types: Cigarettes    Quit date: 05/10/2012    Years since quitting: 6.7  . Smokeless tobacco: Never Used  Substance Use Topics  . Alcohol use: No  . Drug use: No    Comment: marijuana; quit in early 1980's     Allergies   Prednisone and Lisinopril   Review of Systems Review of Systems  Constitutional: Negative.   Respiratory: Negative.   Cardiovascular: Negative.   Gastrointestinal: Negative.   Genitourinary: Negative.   Skin: Positive for rash and wound.  Neurological: Negative.      Physical Exam Triage Vital Signs ED Triage Vitals  Enc Vitals Group     BP 02/16/19 1444 (!) 190/80     Pulse Rate 02/16/19 1444 91     Resp  02/16/19 1444 16     Temp 02/16/19 1444 (!) 97.4 F (36.3 C)     Temp Source 02/16/19 1444 Oral     SpO2 02/16/19 1444 98 %     Weight --      Height --      Head Circumference --      Peak Flow --      Pain Score 02/16/19 1446 4     Pain Loc --      Pain Edu? --      Excl. in [place]? --    No data found.  Updated Vital Signs BP (!) 190/80 (BP Location: Left Arm)   Pulse 91   Temp (!) 97.4 F (36.3 C) (Oral)   Resp 16   SpO2 98%   Visual Acuity Right Eye Distance:   Left Eye Distance:   Bilateral Distance:    Right Eye Near:   Left Eye Near:    Bilateral Near:     Physical Exam Constitutional:      Comments: Chronically ill looking  Cardiovascular:     Pulses: Normal pulses.     Heart sounds: Normal heart sounds.  Pulmonary:     Effort: Pulmonary effort is normal.     Breath sounds: Normal breath sounds.  Abdominal:     General: Bowel sounds are normal.     Palpations: Abdomen is soft.  Skin:    Comments: Stage I sacral decubitus ulcer with no discharge.  Calluses in the palm of the right hand.  Neurological:     Mental Status: She is alert.      UC Treatments / Results  Labs (all labs ordered are listed, but only abnormal results are displayed) Labs Reviewed - No data to display  EKG   Radiology No results found.  Procedures Procedures (including critical care time)  Medications Ordered in UC Medications - No data to display  Initial Impression / Assessment and Plan / UC Course  I have reviewed the triage vital signs and the nursing notes.  Pertinent labs & imaging results that were available during my care of the patient were reviewed by me and considered in my medical decision making (see chart for details).     1.  Stage I decubitus ulcer: Wound care Daily wound dressing Increasing physical activity Silver sulfadiazine dressing changes  2.  Calluses in the right hand: Patient advised to use hand cream to help keep the palms supple.  Final Clinical Impressions(s) / UC Diagnoses   Final diagnoses:  Pressure injury of sacral region, stage 1  Callus   Discharge Instructions   None    ED Prescriptions    Medication Sig Dispense Auth. Provider   silver sulfADIAZINE (SILVADENE) 1 % cream Apply 1 application topically daily. 50 g Chase Picket, MD     Controlled Substance Prescriptions [place] Controlled Substance Registry consulted? No   Chase Picket, MD 02/16/19 [phone]

## 2019-02-17 DIAGNOSIS — Z992 Dependence on renal dialysis: Secondary | ICD-10-CM | POA: Diagnosis not present

## 2019-02-17 DIAGNOSIS — N2581 Secondary hyperparathyroidism of renal origin: Secondary | ICD-10-CM | POA: Diagnosis not present

## 2019-02-17 DIAGNOSIS — E1122 Type 2 diabetes mellitus with diabetic chronic kidney disease: Secondary | ICD-10-CM | POA: Diagnosis not present

## 2019-02-17 DIAGNOSIS — E876 Hypokalemia: Secondary | ICD-10-CM | POA: Diagnosis not present

## 2019-02-17 DIAGNOSIS — M0000 Staphylococcal arthritis, unspecified joint: Secondary | ICD-10-CM | POA: Diagnosis not present

## 2019-02-17 DIAGNOSIS — N186 End stage renal disease: Secondary | ICD-10-CM | POA: Diagnosis not present

## 2019-02-20 DIAGNOSIS — M0000 Staphylococcal arthritis, unspecified joint: Secondary | ICD-10-CM | POA: Diagnosis not present

## 2019-02-20 DIAGNOSIS — E1122 Type 2 diabetes mellitus with diabetic chronic kidney disease: Secondary | ICD-10-CM | POA: Diagnosis not present

## 2019-02-20 DIAGNOSIS — N2581 Secondary hyperparathyroidism of renal origin: Secondary | ICD-10-CM | POA: Diagnosis not present

## 2019-02-20 DIAGNOSIS — N186 End stage renal disease: Secondary | ICD-10-CM | POA: Diagnosis not present

## 2019-02-20 DIAGNOSIS — E876 Hypokalemia: Secondary | ICD-10-CM | POA: Diagnosis not present

## 2019-02-20 DIAGNOSIS — Z992 Dependence on renal dialysis: Secondary | ICD-10-CM | POA: Diagnosis not present

## 2019-02-21 DIAGNOSIS — E1122 Type 2 diabetes mellitus with diabetic chronic kidney disease: Secondary | ICD-10-CM | POA: Diagnosis not present

## 2019-02-21 DIAGNOSIS — E1151 Type 2 diabetes mellitus with diabetic peripheral angiopathy without gangrene: Secondary | ICD-10-CM | POA: Diagnosis not present

## 2019-02-21 DIAGNOSIS — N186 End stage renal disease: Secondary | ICD-10-CM | POA: Diagnosis not present

## 2019-02-22 DIAGNOSIS — M0000 Staphylococcal arthritis, unspecified joint: Secondary | ICD-10-CM | POA: Diagnosis not present

## 2019-02-22 DIAGNOSIS — Z992 Dependence on renal dialysis: Secondary | ICD-10-CM | POA: Diagnosis not present

## 2019-02-22 DIAGNOSIS — N186 End stage renal disease: Secondary | ICD-10-CM | POA: Diagnosis not present

## 2019-02-22 DIAGNOSIS — E1122 Type 2 diabetes mellitus with diabetic chronic kidney disease: Secondary | ICD-10-CM | POA: Diagnosis not present

## 2019-02-22 DIAGNOSIS — E876 Hypokalemia: Secondary | ICD-10-CM | POA: Diagnosis not present

## 2019-02-22 DIAGNOSIS — N2581 Secondary hyperparathyroidism of renal origin: Secondary | ICD-10-CM | POA: Diagnosis not present

## 2019-02-23 DIAGNOSIS — Z992 Dependence on renal dialysis: Secondary | ICD-10-CM | POA: Diagnosis not present

## 2019-02-23 DIAGNOSIS — E1151 Type 2 diabetes mellitus with diabetic peripheral angiopathy without gangrene: Secondary | ICD-10-CM | POA: Diagnosis not present

## 2019-02-23 DIAGNOSIS — F330 Major depressive disorder, recurrent, mild: Secondary | ICD-10-CM | POA: Diagnosis not present

## 2019-02-23 DIAGNOSIS — Z794 Long term (current) use of insulin: Secondary | ICD-10-CM | POA: Diagnosis not present

## 2019-02-23 DIAGNOSIS — N186 End stage renal disease: Secondary | ICD-10-CM | POA: Diagnosis not present

## 2019-02-23 DIAGNOSIS — I10 Essential (primary) hypertension: Secondary | ICD-10-CM | POA: Diagnosis not present

## 2019-02-24 DIAGNOSIS — E876 Hypokalemia: Secondary | ICD-10-CM | POA: Diagnosis not present

## 2019-02-24 DIAGNOSIS — Z992 Dependence on renal dialysis: Secondary | ICD-10-CM | POA: Diagnosis not present

## 2019-02-24 DIAGNOSIS — E1122 Type 2 diabetes mellitus with diabetic chronic kidney disease: Secondary | ICD-10-CM | POA: Diagnosis not present

## 2019-02-24 DIAGNOSIS — M0000 Staphylococcal arthritis, unspecified joint: Secondary | ICD-10-CM | POA: Diagnosis not present

## 2019-02-24 DIAGNOSIS — N186 End stage renal disease: Secondary | ICD-10-CM | POA: Diagnosis not present

## 2019-02-24 DIAGNOSIS — N2581 Secondary hyperparathyroidism of renal origin: Secondary | ICD-10-CM | POA: Diagnosis not present

## 2019-02-25 DIAGNOSIS — I159 Secondary hypertension, unspecified: Secondary | ICD-10-CM | POA: Diagnosis not present

## 2019-02-25 DIAGNOSIS — N186 End stage renal disease: Secondary | ICD-10-CM | POA: Diagnosis not present

## 2019-02-25 DIAGNOSIS — Z992 Dependence on renal dialysis: Secondary | ICD-10-CM | POA: Diagnosis not present

## 2019-02-27 DIAGNOSIS — N2581 Secondary hyperparathyroidism of renal origin: Secondary | ICD-10-CM | POA: Diagnosis not present

## 2019-02-27 DIAGNOSIS — J810 Acute pulmonary edema: Secondary | ICD-10-CM | POA: Diagnosis not present

## 2019-02-27 DIAGNOSIS — N186 End stage renal disease: Secondary | ICD-10-CM | POA: Diagnosis not present

## 2019-02-27 DIAGNOSIS — D631 Anemia in chronic kidney disease: Secondary | ICD-10-CM | POA: Diagnosis not present

## 2019-02-27 DIAGNOSIS — M0000 Staphylococcal arthritis, unspecified joint: Secondary | ICD-10-CM | POA: Diagnosis not present

## 2019-02-27 DIAGNOSIS — Z992 Dependence on renal dialysis: Secondary | ICD-10-CM | POA: Diagnosis not present

## 2019-03-01 DIAGNOSIS — J810 Acute pulmonary edema: Secondary | ICD-10-CM | POA: Diagnosis not present

## 2019-03-01 DIAGNOSIS — N186 End stage renal disease: Secondary | ICD-10-CM | POA: Diagnosis not present

## 2019-03-01 DIAGNOSIS — D631 Anemia in chronic kidney disease: Secondary | ICD-10-CM | POA: Diagnosis not present

## 2019-03-01 DIAGNOSIS — Z992 Dependence on renal dialysis: Secondary | ICD-10-CM | POA: Diagnosis not present

## 2019-03-01 DIAGNOSIS — M0000 Staphylococcal arthritis, unspecified joint: Secondary | ICD-10-CM | POA: Diagnosis not present

## 2019-03-01 DIAGNOSIS — N2581 Secondary hyperparathyroidism of renal origin: Secondary | ICD-10-CM | POA: Diagnosis not present

## 2019-03-03 DIAGNOSIS — J810 Acute pulmonary edema: Secondary | ICD-10-CM | POA: Diagnosis not present

## 2019-03-03 DIAGNOSIS — N2581 Secondary hyperparathyroidism of renal origin: Secondary | ICD-10-CM | POA: Diagnosis not present

## 2019-03-03 DIAGNOSIS — N186 End stage renal disease: Secondary | ICD-10-CM | POA: Diagnosis not present

## 2019-03-03 DIAGNOSIS — Z992 Dependence on renal dialysis: Secondary | ICD-10-CM | POA: Diagnosis not present

## 2019-03-03 DIAGNOSIS — D631 Anemia in chronic kidney disease: Secondary | ICD-10-CM | POA: Diagnosis not present

## 2019-03-03 DIAGNOSIS — M0000 Staphylococcal arthritis, unspecified joint: Secondary | ICD-10-CM | POA: Diagnosis not present

## 2019-03-05 ENCOUNTER — Other Ambulatory Visit: Payer: Self-pay | Admitting: Cardiology

## 2019-03-05 DIAGNOSIS — E7800 Pure hypercholesterolemia, unspecified: Secondary | ICD-10-CM

## 2019-03-06 DIAGNOSIS — D631 Anemia in chronic kidney disease: Secondary | ICD-10-CM | POA: Diagnosis not present

## 2019-03-06 DIAGNOSIS — Z992 Dependence on renal dialysis: Secondary | ICD-10-CM | POA: Diagnosis not present

## 2019-03-06 DIAGNOSIS — M0000 Staphylococcal arthritis, unspecified joint: Secondary | ICD-10-CM | POA: Diagnosis not present

## 2019-03-06 DIAGNOSIS — J810 Acute pulmonary edema: Secondary | ICD-10-CM | POA: Diagnosis not present

## 2019-03-06 DIAGNOSIS — N186 End stage renal disease: Secondary | ICD-10-CM | POA: Diagnosis not present

## 2019-03-06 DIAGNOSIS — N2581 Secondary hyperparathyroidism of renal origin: Secondary | ICD-10-CM | POA: Diagnosis not present

## 2019-03-07 ENCOUNTER — Ambulatory Visit
Admission: RE | Admit: 2019-03-07 | Discharge: 2019-03-07 | Disposition: A | Discharge status: Home / Self Care | Payer: Medicare Other | Source: Ambulatory Visit | Attending: Orthopedic Surgery | Admitting: Orthopedic Surgery

## 2019-03-07 DIAGNOSIS — M48061 Spinal stenosis, lumbar region without neurogenic claudication: Secondary | ICD-10-CM | POA: Diagnosis not present

## 2019-03-07 DIAGNOSIS — M5416 Radiculopathy, lumbar region: Secondary | ICD-10-CM

## 2019-03-08 DIAGNOSIS — N2581 Secondary hyperparathyroidism of renal origin: Secondary | ICD-10-CM | POA: Diagnosis not present

## 2019-03-08 DIAGNOSIS — J810 Acute pulmonary edema: Secondary | ICD-10-CM | POA: Diagnosis not present

## 2019-03-08 DIAGNOSIS — D631 Anemia in chronic kidney disease: Secondary | ICD-10-CM | POA: Diagnosis not present

## 2019-03-08 DIAGNOSIS — Z992 Dependence on renal dialysis: Secondary | ICD-10-CM | POA: Diagnosis not present

## 2019-03-08 DIAGNOSIS — M0000 Staphylococcal arthritis, unspecified joint: Secondary | ICD-10-CM | POA: Diagnosis not present

## 2019-03-08 DIAGNOSIS — N186 End stage renal disease: Secondary | ICD-10-CM | POA: Diagnosis not present

## 2019-03-09 DIAGNOSIS — E113511 Type 2 diabetes mellitus with proliferative diabetic retinopathy with macular edema, right eye: Secondary | ICD-10-CM | POA: Diagnosis not present

## 2019-03-10 ENCOUNTER — Other Ambulatory Visit: Payer: Self-pay

## 2019-03-10 DIAGNOSIS — Z992 Dependence on renal dialysis: Secondary | ICD-10-CM | POA: Diagnosis not present

## 2019-03-10 DIAGNOSIS — N2581 Secondary hyperparathyroidism of renal origin: Secondary | ICD-10-CM | POA: Diagnosis not present

## 2019-03-10 DIAGNOSIS — M0000 Staphylococcal arthritis, unspecified joint: Secondary | ICD-10-CM | POA: Diagnosis not present

## 2019-03-10 DIAGNOSIS — M5416 Radiculopathy, lumbar region: Secondary | ICD-10-CM

## 2019-03-10 DIAGNOSIS — N186 End stage renal disease: Secondary | ICD-10-CM | POA: Diagnosis not present

## 2019-03-10 DIAGNOSIS — J810 Acute pulmonary edema: Secondary | ICD-10-CM | POA: Diagnosis not present

## 2019-03-10 DIAGNOSIS — D631 Anemia in chronic kidney disease: Secondary | ICD-10-CM | POA: Diagnosis not present

## 2019-03-13 ENCOUNTER — Ambulatory Visit: Payer: Medicare Other | Admitting: Orthopedic Surgery

## 2019-03-13 DIAGNOSIS — Z992 Dependence on renal dialysis: Secondary | ICD-10-CM | POA: Diagnosis not present

## 2019-03-13 DIAGNOSIS — M0000 Staphylococcal arthritis, unspecified joint: Secondary | ICD-10-CM | POA: Diagnosis not present

## 2019-03-13 DIAGNOSIS — N186 End stage renal disease: Secondary | ICD-10-CM | POA: Diagnosis not present

## 2019-03-13 DIAGNOSIS — D631 Anemia in chronic kidney disease: Secondary | ICD-10-CM | POA: Diagnosis not present

## 2019-03-13 DIAGNOSIS — N2581 Secondary hyperparathyroidism of renal origin: Secondary | ICD-10-CM | POA: Diagnosis not present

## 2019-03-13 DIAGNOSIS — J810 Acute pulmonary edema: Secondary | ICD-10-CM | POA: Diagnosis not present

## 2019-03-14 ENCOUNTER — Other Ambulatory Visit: Payer: Self-pay | Admitting: *Deleted

## 2019-03-14 DIAGNOSIS — D631 Anemia in chronic kidney disease: Secondary | ICD-10-CM | POA: Diagnosis not present

## 2019-03-14 DIAGNOSIS — N2581 Secondary hyperparathyroidism of renal origin: Secondary | ICD-10-CM | POA: Diagnosis not present

## 2019-03-14 DIAGNOSIS — N186 End stage renal disease: Secondary | ICD-10-CM | POA: Diagnosis not present

## 2019-03-14 DIAGNOSIS — Z992 Dependence on renal dialysis: Secondary | ICD-10-CM | POA: Diagnosis not present

## 2019-03-14 DIAGNOSIS — J810 Acute pulmonary edema: Secondary | ICD-10-CM | POA: Diagnosis not present

## 2019-03-14 DIAGNOSIS — M0000 Staphylococcal arthritis, unspecified joint: Secondary | ICD-10-CM | POA: Diagnosis not present

## 2019-03-14 NOTE — Patient Outreach (Signed)
[facility]) Care Management  03/14/2019  Destiny Day March 03, 1951 [id number]   [facility] attempted follow up outreach call to patient.  Patient was unavailable. HIPPA compliance voicemail message left with return callback number.  Plan: RN will call patient again within 30 days.  [facility] Care Management [phone]

## 2019-03-15 DIAGNOSIS — N186 End stage renal disease: Secondary | ICD-10-CM | POA: Diagnosis not present

## 2019-03-15 DIAGNOSIS — M0000 Staphylococcal arthritis, unspecified joint: Secondary | ICD-10-CM | POA: Diagnosis not present

## 2019-03-15 DIAGNOSIS — Z992 Dependence on renal dialysis: Secondary | ICD-10-CM | POA: Diagnosis not present

## 2019-03-15 DIAGNOSIS — N2581 Secondary hyperparathyroidism of renal origin: Secondary | ICD-10-CM | POA: Diagnosis not present

## 2019-03-15 DIAGNOSIS — D631 Anemia in chronic kidney disease: Secondary | ICD-10-CM | POA: Diagnosis not present

## 2019-03-15 DIAGNOSIS — J810 Acute pulmonary edema: Secondary | ICD-10-CM | POA: Diagnosis not present

## 2019-03-17 DIAGNOSIS — Z992 Dependence on renal dialysis: Secondary | ICD-10-CM | POA: Diagnosis not present

## 2019-03-17 DIAGNOSIS — J810 Acute pulmonary edema: Secondary | ICD-10-CM | POA: Diagnosis not present

## 2019-03-17 DIAGNOSIS — N2581 Secondary hyperparathyroidism of renal origin: Secondary | ICD-10-CM | POA: Diagnosis not present

## 2019-03-17 DIAGNOSIS — D631 Anemia in chronic kidney disease: Secondary | ICD-10-CM | POA: Diagnosis not present

## 2019-03-17 DIAGNOSIS — N186 End stage renal disease: Secondary | ICD-10-CM | POA: Diagnosis not present

## 2019-03-17 DIAGNOSIS — M0000 Staphylococcal arthritis, unspecified joint: Secondary | ICD-10-CM | POA: Diagnosis not present

## 2019-03-20 DIAGNOSIS — N186 End stage renal disease: Secondary | ICD-10-CM | POA: Diagnosis not present

## 2019-03-20 DIAGNOSIS — N2581 Secondary hyperparathyroidism of renal origin: Secondary | ICD-10-CM | POA: Diagnosis not present

## 2019-03-20 DIAGNOSIS — M0000 Staphylococcal arthritis, unspecified joint: Secondary | ICD-10-CM | POA: Diagnosis not present

## 2019-03-20 DIAGNOSIS — Z992 Dependence on renal dialysis: Secondary | ICD-10-CM | POA: Diagnosis not present

## 2019-03-20 DIAGNOSIS — D631 Anemia in chronic kidney disease: Secondary | ICD-10-CM | POA: Diagnosis not present

## 2019-03-20 DIAGNOSIS — J810 Acute pulmonary edema: Secondary | ICD-10-CM | POA: Diagnosis not present

## 2019-03-21 ENCOUNTER — Ambulatory Visit (INDEPENDENT_AMBULATORY_CARE_PROVIDER_SITE_OTHER): Payer: Medicare Other | Admitting: Orthopedic Surgery

## 2019-03-21 DIAGNOSIS — I779 Disorder of arteries and arterioles, unspecified: Secondary | ICD-10-CM

## 2019-03-21 DIAGNOSIS — M5416 Radiculopathy, lumbar region: Secondary | ICD-10-CM

## 2019-03-22 DIAGNOSIS — D631 Anemia in chronic kidney disease: Secondary | ICD-10-CM | POA: Diagnosis not present

## 2019-03-22 DIAGNOSIS — N186 End stage renal disease: Secondary | ICD-10-CM | POA: Diagnosis not present

## 2019-03-22 DIAGNOSIS — N2581 Secondary hyperparathyroidism of renal origin: Secondary | ICD-10-CM | POA: Diagnosis not present

## 2019-03-22 DIAGNOSIS — J810 Acute pulmonary edema: Secondary | ICD-10-CM | POA: Diagnosis not present

## 2019-03-22 DIAGNOSIS — M0000 Staphylococcal arthritis, unspecified joint: Secondary | ICD-10-CM | POA: Diagnosis not present

## 2019-03-22 DIAGNOSIS — Z992 Dependence on renal dialysis: Secondary | ICD-10-CM | POA: Diagnosis not present

## 2019-03-24 DIAGNOSIS — N186 End stage renal disease: Secondary | ICD-10-CM | POA: Diagnosis not present

## 2019-03-24 DIAGNOSIS — N2581 Secondary hyperparathyroidism of renal origin: Secondary | ICD-10-CM | POA: Diagnosis not present

## 2019-03-24 DIAGNOSIS — Z992 Dependence on renal dialysis: Secondary | ICD-10-CM | POA: Diagnosis not present

## 2019-03-24 DIAGNOSIS — M0000 Staphylococcal arthritis, unspecified joint: Secondary | ICD-10-CM | POA: Diagnosis not present

## 2019-03-24 DIAGNOSIS — J810 Acute pulmonary edema: Secondary | ICD-10-CM | POA: Diagnosis not present

## 2019-03-24 DIAGNOSIS — D631 Anemia in chronic kidney disease: Secondary | ICD-10-CM | POA: Diagnosis not present

## 2019-03-25 ENCOUNTER — Encounter: Payer: Self-pay | Admitting: Orthopedic Surgery

## 2019-03-25 NOTE — Progress Notes (Signed)
Office Visit Note   Patient: Destiny Day           Date of Birth: 14-Jan-1951           MRN: [id number] Visit Date: 03/21/2019              Requested by: Velna Hatchet, MD [address] [name],  [place] [id number] PCP: Velna Hatchet, MD  Chief Complaint  Patient presents with  . Lower Back - Follow-up      HPI: Patient is a 68 year old woman with diabetes end-stage renal disease on dialysis who has right sided radicular pain from the buttocks down to the knee.  Patient is status post MRI scan.  Assessment & Plan: Visit Diagnoses:  1. Radiculopathy, lumbar region     Plan: Review the MRI scan does show a bulging disc at L4-5 to the right.  Will have patient consult with Dr. Ernestina Patches for evaluation of epidural steroid injection.  Follow-Up Instructions: No follow-ups on file.   Ortho Exam  Patient is alert, oriented, no adenopathy, well-dressed, normal affect, normal respiratory effort. Examination patient has difficulty getting from a sitting to a standing position she uses a walker.  She has a negative straight leg raise bilaterally with no focal motor weakness.  Review of the MRI scan shows a right disc protrusion at L4-5 with displacement of the right L4 nerve root.  Imaging: No results found. No images are attached to the encounter.  Labs: Lab Results  Component Value Date   HGBA1C 7.2 (H) 05/06/2018   HGBA1C 6.4 (H) 12/31/2017   HGBA1C 5.0 09/03/2017   REPTSTATUS 05/12/2018 FINAL 05/07/2018   GRAMSTAIN  05/06/2018    MODERATE WBC PRESENT, PREDOMINANTLY PMN NO ORGANISMS SEEN    CULT  05/07/2018    NO GROWTH 5 DAYS Performed at [facility], [place] [address] [place], [place] [address]    [place] 05/06/2018     Lab Results  Component Value Date   ALBUMIN 2.2 (L) 06/19/2018   ALBUMIN 2.2 (L) 06/11/2018   ALBUMIN 2.2 (L) 05/09/2018    Lab Results  Component Value Date   MG 1.9 05/07/2018   MG 1.9 01/23/2018   MG  1.9 08/27/2017   No results found for: VD25OH  No results found for: PREALBUMIN CBC EXTENDED Latest Ref Rng & Units 12/20/2018 12/15/2018 11/17/2018  WBC 4.0 - 10.5 K/uL - 5.9 -  RBC 3.87 - 5.11 MIL/uL - 3.92 -  HGB 12.0 - 15.0 g/dL 12.2 12.3 12.2  HCT 36.0 - 46.0 % 36.0 38.9 36.0  PLT 150 - 400 K/uL - 260 -  NEUTROABS 1.7 - 7.7 K/uL - - -  LYMPHSABS 0.7 - 4.0 K/uL - - -     There is no height or weight on file to calculate BMI.  Orders:  Orders Placed This Encounter  Procedures  . Ambulatory referral to Physical Medicine Rehab   No orders of the defined types were placed in this encounter.    Procedures: No procedures performed  Clinical Data: No additional findings.  ROS:  All other systems negative, except as noted in the HPI. Review of Systems  Objective: Vital Signs: There were no vitals taken for this visit.  Specialty Comments:  No specialty comments available.  PMFS History: Patient Active Problem List   Diagnosis Date Noted  . Fluid overload 06/18/2018  . Old bucket handle tear of lateral meniscus of right knee   . Old peripheral tear of medial  meniscus of right knee   . Septic arthritis of knee, right ([place]) 05/06/2018  . Degenerative tear of posterior horn of medial meniscus, right   . Old complex tear of lateral meniscus of right knee   . Loose body in knee, right   . Acute pulmonary edema ([place]) 02/21/2018  . 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[place]) 02/03/2018  . End-stage renal disease on hemodialysis ([place])   . Respiratory failure ([place]) 01/21/2018  . Achilles tendon contracture, right 11/09/2017  . Labile blood glucose   . Anemia of infection and chronic disease   . Essential hypertension   . Black stool   . Right sided weakness   . Subdural hemorrhage following injury ([place]) 08/30/2017  . Diabetic peripheral neuropathy ([place])   . Chronic obstructive pulmonary disease ([place])   . Seizure prophylaxis   . Subdural hematoma ([place])  08/24/2017  . Traumatic subdural hematoma ([place]) 08/23/2017  . Fall   . SDH (subdural hematoma) ([place])   . Acute respiratory failure with hypoxia ([place])   . Diabetes mellitus type 2 in nonobese (HCC)   . Leukocytosis   . Labile blood pressure   . Generalized anxiety disorder   . Debility 08/16/2017  . Amputated great toe of right foot ([place])   . Amputated toe of left foot ([place])   . Post-operative pain   . ESRD on dialysis ([place])   . Diabetes mellitus ([place])   . Anxiety state   . Benign essential HTN   . Acute blood loss anemia   . Anemia of chronic disease   . Idiopathic chronic venous hypertension of both lower extremities with inflammation 10/13/2016  . S/P transmetatarsal amputation of foot, left ([place]) 09/21/2016  . Onychomycosis 08/15/2016  . PAD (peripheral artery disease) ([place]) 07/08/2016  . Atherosclerosis of native artery of left lower extremity with gangrene ([place]) 06/23/2016  . GERD (gastroesophageal reflux disease) 10/07/2015  . ARF (acute renal failure) ([place])   . Hypothermia 06/12/2015  . Acute on chronic renal failure ([place]) 06/12/2015  . CHF (congestive heart failure) ([place]) 07/30/2014  . CKD (chronic kidney disease) stage 4, GFR 15-29 ml/min (HCC) 06/27/2014  . Hypertensive heart disease 05/25/2013  . CKD (chronic kidney disease) stage 3, GFR 30-59 ml/min (HCC) 05/25/2013  . Pulmonary edema 05/23/2013  . Type 2 diabetes mellitus with diabetic nephropathy ([place]) 05/23/2013  . Type 2 diabetes mellitus with hyperosmolar nonketotic hyperglycemia ([place]) 04/13/2013  . Acute on chronic diastolic heart failure ([place]) 04/13/2013  . UGI bleed 03/31/2013  . Hypokalemia 08/24/2012  . Type II or unspecified type diabetes mellitus without mention of complication, not stated as uncontrolled 12/27/2007  . HLD (hyperlipidemia) 12/27/2007  . Allergic rhinitis due to pollen 12/27/2007  . Asthma 12/27/2007   Past Medical History:  Diagnosis Date  . Abdominal bruit   . Anemia   . Anxiety    . Arthritis    Osteoarthritis  . Asthma   . Cervical disc disease    "pinced nerve"  . CHF (congestive heart failure) ([place])   . Complication of anesthesia    " after I got home from my last procedure, I started itching."  . Diabetes mellitus    Type II  . Diverticulitis   . ESRD (end stage renal disease) ([place])    dialysis - M/W/F- [place]  . GERD (gastroesophageal reflux disease)    from medications  . GI bleed 03/31/2013  . Head injury 07/2017  . History of hiatal hernia   . Hyperlipidemia   .  Hypertension   . Neuropathy    left leg  . Osteoporosis   . Peripheral vascular disease ([place])   . Pneumonia    "very young" and a few years ago  . PONV (postoperative nausea and vomiting)   . Seasonal allergies   . Shortness of breath dyspnea    WIth exertion, when fluid builds  . Sleep apnea    can't afford cpap     Family History  Problem Relation Age of Onset  . Other Mother        not sure of cause of death  . Diabetes Father   . Pancreatic cancer Maternal Grandmother   . Colon cancer Neg Hx     Past Surgical History:  Procedure Laterality Date  . A/V SHUNTOGRAM N/A 09/22/2016   Procedure: A/V Shuntogram - left arm;  Surgeon: Serafina Mitchell, MD;  Location: [facility];  Service: Cardiovascular;  Laterality: N/A;  . A/V SHUNTOGRAM N/A 03/22/2018   Procedure: A/V SHUNTOGRAM - left arm;  Surgeon: Serafina Mitchell, MD;  Location: [facility];  Service: Cardiovascular;  Laterality: N/A;  . A/V SHUNTOGRAM Left 11/17/2018   Procedure: A/V SHUNTOGRAM;  Surgeon: Angelia Mould, MD;  Location: [facility];  Service: Cardiovascular;  Laterality: Left;  . ABDOMINAL HYSTERECTOMY  1993`  . AMPUTATION Left 09/01/2016   Procedure: LEFT FOOT TRANSMETATARSAL AMPUTATION;  Surgeon: Newt Minion, MD;  Location: [facility];  Service: Orthopedics;  Laterality: Left;  . AMPUTATION Right 08/11/2017   Procedure: RIGHT GREAT TOE AMPUTATION DIGIT;  Surgeon: Rosetta Posner, MD;   Location: [facility];  Service: Vascular;  Laterality: Right;  . AMPUTATION Right 12/31/2017   Procedure: RIGHT TRANSMETATARSAL AMPUTATION;  Surgeon: Newt Minion, MD;  Location: [facility];  Service: Orthopedics;  Laterality: Right;  . AV FISTULA PLACEMENT Left 04/21/2016   Procedure: INSERTION OF ARTERIOVENOUS (AV) GORE-TEX GRAFT ARM LEFT;  Surgeon: Elam Dutch, MD;  Location: [facility];  Service: Vascular;  Laterality: Left;  . [place] TRANSPOSITION Left 07/10/2014   Procedure: BASCILIC VEIN TRANSPOSITION;  Surgeon: Angelia Mould, MD;  Location: [facility];  Service: Vascular;  Laterality: Left;  . BASCILIC VEIN TRANSPOSITION Right 11/08/2014   Procedure: FIRST STAGE BASILIC VEIN TRANSPOSITION;  Surgeon: Angelia Mould, MD;  Location: [facility];  Service: Vascular;  Laterality: Right;  . BASCILIC VEIN TRANSPOSITION Right 01/18/2015   Procedure: SECOND STAGE BASILIC VEIN TRANSPOSITION;  Surgeon: Angelia Mould, MD;  Location: [facility];  Service: Vascular;  Laterality: Right;  . CRANIOTOMY N/A 08/23/2017   Procedure: CRANIOTOMY HEMATOMA EVACUATION SUBDURAL;  Surgeon: Ashok Pall, MD;  Location: [facility];  Service: Neurosurgery;  Laterality: N/A;  . CRANIOTOMY Left 08/24/2017   Procedure: CRANIOTOMY FOR RECURRENT ACUTE SUBDURAL HEMATOMA;  Surgeon: Ashok Pall, MD;  Location: [facility];  Service: Neurosurgery;  Laterality: Left;  . ESOPHAGOGASTRODUODENOSCOPY N/A 03/31/2013   Procedure: ESOPHAGOGASTRODUODENOSCOPY (EGD);  Surgeon: Gatha Mayer, MD;  Location: [facility];  Service: Endoscopy;  Laterality: N/A;  . EYE SURGERY     laser surgery  . FEMORAL-POPLITEAL BYPASS GRAFT Left 07/08/2016   Procedure: LEFT  FEMORAL-BELOW KNEE POPLITEAL ARTERY BYPASS GRAFT USING 6MM X 80 CM PROPATEN GORETEX GRAFT WITH RINGS.;  Surgeon: Rosetta Posner, MD;  Location: [facility];  Service: Vascular;  Laterality: Left;  . FEMORAL-POPLITEAL BYPASS GRAFT Right 08/11/2017   Procedure: RIGHT FEMORAL TO BELOW KNEE POPLITEAL  ARTERKY  BYPASS GRAFT USING 6MM RINGED PROPATEN GRAFT;  Surgeon: Rosetta Posner, MD;  Location: [facility];  Service: Vascular;  Laterality: Right;  . FISTULOGRAM Left 10/29/2014   Procedure: FISTULOGRAM;  Surgeon: Angelia Mould, MD;  Location: [facility] LAB;  Service: Cardiovascular;  Laterality: Left;  [place] Kitchen GAS/FLUID EXCHANGE Left 12/20/2018   Procedure: AIR GAS EXCHANGE;  Surgeon: Jalene Mullet, MD;  Location: [facility];  Service: Ophthalmology;  Laterality: Left;  . KNEE ARTHROSCOPY Right 05/06/2018   Procedure: RIGHT KNEE ARTHROSCOPY;  Surgeon: Newt Minion, MD;  Location: [place] [facility];  Service: Orthopedics;  Laterality: Right;  . KNEE ARTHROSCOPY Right 06/10/2018   Procedure: RIGHT KNEE ARTHROSCOPY AND DEBRIDEMENT;  Surgeon: Newt Minion, MD;  Location: [place] [place];  Service: Orthopedics;  Laterality: Right;  . LIGATION OF ARTERIOVENOUS  FISTULA Left 04/21/2016   Procedure: LIGATION OF ARTERIOVENOUS  FISTULA LEFT ARM;  Surgeon: Elam Dutch, MD;  Location: [facility];  Service: Vascular;  Laterality: Left;  . LOWER EXTREMITY ANGIOGRAPHY N/A 04/06/2017   Procedure: Lower Extremity Angiography - Right;  Surgeon: Serafina Mitchell, MD;  Location: [facility];  Service: Cardiovascular;  Laterality: N/A;  . MEMBRANE PEEL Left 12/20/2018   Procedure: MEMBRANE PEEL;  Surgeon: Jalene Mullet, MD;  Location: [facility];  Service: Ophthalmology;  Laterality: Left;  . PATCH ANGIOPLASTY Right 01/18/2015   Procedure: BASILIC VEIN PATCH ANGIOPLASTY USING VASCUGUARD PATCH;  Surgeon: Angelia Mould, MD;  Location: [facility];  Service: Vascular;  Laterality: Right;  . PERIPHERAL VASCULAR BALLOON ANGIOPLASTY  09/22/2016   Procedure: Peripheral Vascular Balloon Angioplasty;  Surgeon: Serafina Mitchell, MD;  Location: [facility];  Service: Cardiovascular;;  Lt. Fistula  . PERIPHERAL VASCULAR BALLOON ANGIOPLASTY Left 03/22/2018   Procedure: PERIPHERAL VASCULAR BALLOON ANGIOPLASTY;  Surgeon: Serafina Mitchell, MD;  Location:  [facility];  Service: Cardiovascular;  Laterality: Left;  Arm shunt  . PERIPHERAL VASCULAR CATHETERIZATION N/A 06/23/2016   Procedure: Abdominal Aortogram w/Lower Extremity;  Surgeon: Serafina Mitchell, MD;  Location: [facility];  Service: Cardiovascular;  Laterality: N/A;  . PERIPHERAL VASCULAR CATHETERIZATION  06/23/2016   Procedure: Peripheral Vascular Intervention;  Surgeon: Serafina Mitchell, MD;  Location: [facility];  Service: Cardiovascular;;  lt common and external illiac artery  . PHOTOCOAGULATION WITH LASER Left 12/20/2018   Procedure: PHOTOCOAGULATION WITH LASER;  Surgeon: Jalene Mullet, MD;  Location: [facility];  Service: Ophthalmology;  Laterality: Left;  . 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 Occupation: Retired  Tobacco Use  . Smoking status: Former Smoker    Packs/day: 0.35    Years: 40.00    Pack years: 14.00    Types: Cigarettes    Quit date: 05/10/2012    Years since quitting: 6.8  . Smokeless tobacco: Never Used  Substance and Sexual Activity  . Alcohol use: No  . Drug use: No    Comment: marijuana; quit in early 1980's  . Sexual activity: Yes

## 2019-03-27 DIAGNOSIS — D631 Anemia in chronic kidney disease: Secondary | ICD-10-CM | POA: Diagnosis not present

## 2019-03-27 DIAGNOSIS — M0000 Staphylococcal arthritis, unspecified joint: Secondary | ICD-10-CM | POA: Diagnosis not present

## 2019-03-27 DIAGNOSIS — N186 End stage renal disease: Secondary | ICD-10-CM | POA: Diagnosis not present

## 2019-03-27 DIAGNOSIS — Z992 Dependence on renal dialysis: Secondary | ICD-10-CM | POA: Diagnosis not present

## 2019-03-27 DIAGNOSIS — N2581 Secondary hyperparathyroidism of renal origin: Secondary | ICD-10-CM | POA: Diagnosis not present

## 2019-03-27 DIAGNOSIS — J810 Acute pulmonary edema: Secondary | ICD-10-CM | POA: Diagnosis not present

## 2019-03-28 DIAGNOSIS — Z992 Dependence on renal dialysis: Secondary | ICD-10-CM | POA: Diagnosis not present

## 2019-03-28 DIAGNOSIS — I159 Secondary hypertension, unspecified: Secondary | ICD-10-CM | POA: Diagnosis not present

## 2019-03-28 DIAGNOSIS — N186 End stage renal disease: Secondary | ICD-10-CM | POA: Diagnosis not present

## 2019-03-29 DIAGNOSIS — Z23 Encounter for immunization: Secondary | ICD-10-CM | POA: Diagnosis not present

## 2019-03-29 DIAGNOSIS — Z992 Dependence on renal dialysis: Secondary | ICD-10-CM | POA: Diagnosis not present

## 2019-03-29 DIAGNOSIS — D631 Anemia in chronic kidney disease: Secondary | ICD-10-CM | POA: Diagnosis not present

## 2019-03-29 DIAGNOSIS — E1122 Type 2 diabetes mellitus with diabetic chronic kidney disease: Secondary | ICD-10-CM | POA: Diagnosis not present

## 2019-03-29 DIAGNOSIS — D509 Iron deficiency anemia, unspecified: Secondary | ICD-10-CM | POA: Diagnosis not present

## 2019-03-29 DIAGNOSIS — N186 End stage renal disease: Secondary | ICD-10-CM | POA: Diagnosis not present

## 2019-03-29 DIAGNOSIS — N2581 Secondary hyperparathyroidism of renal origin: Secondary | ICD-10-CM | POA: Diagnosis not present

## 2019-03-29 DIAGNOSIS — E876 Hypokalemia: Secondary | ICD-10-CM | POA: Diagnosis not present

## 2019-03-31 ENCOUNTER — Telehealth: Payer: Self-pay | Admitting: Cardiology

## 2019-03-31 ENCOUNTER — Encounter: Payer: Self-pay | Admitting: Cardiology

## 2019-03-31 DIAGNOSIS — D631 Anemia in chronic kidney disease: Secondary | ICD-10-CM | POA: Diagnosis not present

## 2019-03-31 DIAGNOSIS — Z992 Dependence on renal dialysis: Secondary | ICD-10-CM | POA: Diagnosis not present

## 2019-03-31 DIAGNOSIS — N186 End stage renal disease: Secondary | ICD-10-CM | POA: Diagnosis not present

## 2019-03-31 DIAGNOSIS — D509 Iron deficiency anemia, unspecified: Secondary | ICD-10-CM | POA: Diagnosis not present

## 2019-03-31 DIAGNOSIS — N2581 Secondary hyperparathyroidism of renal origin: Secondary | ICD-10-CM | POA: Diagnosis not present

## 2019-04-03 DIAGNOSIS — D631 Anemia in chronic kidney disease: Secondary | ICD-10-CM | POA: Diagnosis not present

## 2019-04-03 DIAGNOSIS — N2581 Secondary hyperparathyroidism of renal origin: Secondary | ICD-10-CM | POA: Diagnosis not present

## 2019-04-03 DIAGNOSIS — Z992 Dependence on renal dialysis: Secondary | ICD-10-CM | POA: Diagnosis not present

## 2019-04-03 DIAGNOSIS — D509 Iron deficiency anemia, unspecified: Secondary | ICD-10-CM | POA: Diagnosis not present

## 2019-04-03 DIAGNOSIS — N186 End stage renal disease: Secondary | ICD-10-CM | POA: Diagnosis not present

## 2019-04-04 ENCOUNTER — Other Ambulatory Visit: Payer: Self-pay | Admitting: *Deleted

## 2019-04-04 NOTE — Patient Outreach (Signed)
[facility]) Care Management  04/04/2019  Destiny Day 05-19-1951 [id number]   RN Health Coach attempted follow up outreach call to patient.  Patient was unavailable. HIPPA compliance No voicemail message pick up.  Plan: RN will call patient again within 30 days.  [place] Care Management [phone]

## 2019-04-05 DIAGNOSIS — D509 Iron deficiency anemia, unspecified: Secondary | ICD-10-CM | POA: Diagnosis not present

## 2019-04-05 DIAGNOSIS — Z992 Dependence on renal dialysis: Secondary | ICD-10-CM | POA: Diagnosis not present

## 2019-04-05 DIAGNOSIS — N186 End stage renal disease: Secondary | ICD-10-CM | POA: Diagnosis not present

## 2019-04-05 DIAGNOSIS — D631 Anemia in chronic kidney disease: Secondary | ICD-10-CM | POA: Diagnosis not present

## 2019-04-05 DIAGNOSIS — N2581 Secondary hyperparathyroidism of renal origin: Secondary | ICD-10-CM | POA: Diagnosis not present

## 2019-04-06 ENCOUNTER — Ambulatory Visit: Payer: Self-pay

## 2019-04-06 ENCOUNTER — Ambulatory Visit (INDEPENDENT_AMBULATORY_CARE_PROVIDER_SITE_OTHER): Payer: Medicare Other | Admitting: Physical Medicine and Rehabilitation

## 2019-04-06 ENCOUNTER — Encounter: Payer: Self-pay | Admitting: Physical Medicine and Rehabilitation

## 2019-04-06 VITALS — BP 186/100 | HR 97

## 2019-04-06 DIAGNOSIS — M5116 Intervertebral disc disorders with radiculopathy, lumbar region: Secondary | ICD-10-CM

## 2019-04-06 MED ORDER — BETAMETHASONE SOD PHOS & ACET 6 (3-3) MG/ML IJ SUSP
12.0000 mg | Freq: Once | INTRAMUSCULAR | Status: AC
  Administered 2019-04-06: 3 mg

## 2019-04-06 NOTE — Progress Notes (Signed)
  Numeric Pain Rating Scale and Functional Assessment Average Pain 7   In the last MONTH (on 0-10 scale) has pain interfered with the following?  1. General activity like being  able to carry out your everyday physical activities such as walking, climbing stairs, carrying groceries, or moving a chair?  Rating(9)   +Driver,  -Dye Allergies.

## 2019-04-06 NOTE — Procedures (Signed)
Lumbosacral Transforaminal Epidural Steroid Injection - Sub-Pedicular Approach with Fluoroscopic Guidance  Patient: Destiny Day      Date of Birth: 1950/08/20 MRN: [id number] PCP: Velna Hatchet, MD      Visit Date: 04/06/2019   Universal Protocol:    Date/Time: 04/06/2019  Consent Given By: the patient  Position: PRONE  Additional Comments: Vital signs were monitored before and after the procedure. Patient was prepped and draped in the usual sterile fashion. The correct patient, procedure, and site was verified.   Injection Procedure Details:  Procedure Site One Meds Administered:  Meds ordered this encounter  Medications  . betamethasone acetate-betamethasone sodium phosphate (CELESTONE) injection 12 mg    Laterality: Right  Location/Site:  L4-L5  Needle size: 22 G  Needle type: Spinal  Needle Placement: Transforaminal  Findings:    -Comments: Excellent flow of contrast along the nerve and into the epidural space.  Procedure Details: After squaring off the end-plates to get a true AP view, the C-arm was positioned so that an oblique view of the foramen as noted above was visualized. The target area is just inferior to the "nose of the scotty dog" or sub pedicular. The soft tissues overlying this structure were infiltrated with 2-3 ml. of 1% Lidocaine without Epinephrine.  The spinal needle was inserted toward the target using a "trajectory" view along the fluoroscope beam.  Under AP and lateral visualization, the needle was advanced so it did not puncture dura and was located close the 6 O'Clock position of the pedical in AP tracterory. Biplanar projections were used to confirm position. Aspiration was confirmed to be negative for CSF and/or blood. A 1-2 ml. volume of Isovue-250 was injected and flow of contrast was noted at each level. Radiographs were obtained for documentation purposes.   After attaining the desired flow of contrast documented above, a 0.5  to 1.0 ml test dose of 0.25% Marcaine was injected into each respective transforaminal space.  The patient was observed for 90 seconds post injection.  After no sensory deficits were reported, and normal lower extremity motor function was noted,   the above injectate was administered so that equal amounts of the injectate were placed at each foramen (level) into the transforaminal epidural space.   Additional Comments:  The patient tolerated the procedure well Dressing: 2 x 2 sterile gauze and Band-Aid    Post-procedure details: Patient was observed during the procedure. Post-procedure instructions were reviewed.  Patient left the clinic in stable condition.

## 2019-04-07 DIAGNOSIS — Z992 Dependence on renal dialysis: Secondary | ICD-10-CM | POA: Diagnosis not present

## 2019-04-07 DIAGNOSIS — D509 Iron deficiency anemia, unspecified: Secondary | ICD-10-CM | POA: Diagnosis not present

## 2019-04-07 DIAGNOSIS — N186 End stage renal disease: Secondary | ICD-10-CM | POA: Diagnosis not present

## 2019-04-07 DIAGNOSIS — N2581 Secondary hyperparathyroidism of renal origin: Secondary | ICD-10-CM | POA: Diagnosis not present

## 2019-04-07 DIAGNOSIS — D631 Anemia in chronic kidney disease: Secondary | ICD-10-CM | POA: Diagnosis not present

## 2019-04-08 ENCOUNTER — Other Ambulatory Visit: Payer: Self-pay | Admitting: Cardiology

## 2019-04-08 DIAGNOSIS — E7800 Pure hypercholesterolemia, unspecified: Secondary | ICD-10-CM

## 2019-04-10 DIAGNOSIS — D631 Anemia in chronic kidney disease: Secondary | ICD-10-CM | POA: Diagnosis not present

## 2019-04-10 DIAGNOSIS — N186 End stage renal disease: Secondary | ICD-10-CM | POA: Diagnosis not present

## 2019-04-10 DIAGNOSIS — N2581 Secondary hyperparathyroidism of renal origin: Secondary | ICD-10-CM | POA: Diagnosis not present

## 2019-04-10 DIAGNOSIS — Z992 Dependence on renal dialysis: Secondary | ICD-10-CM | POA: Diagnosis not present

## 2019-04-10 DIAGNOSIS — D509 Iron deficiency anemia, unspecified: Secondary | ICD-10-CM | POA: Diagnosis not present

## 2019-04-11 DIAGNOSIS — E1151 Type 2 diabetes mellitus with diabetic peripheral angiopathy without gangrene: Secondary | ICD-10-CM | POA: Diagnosis not present

## 2019-04-11 DIAGNOSIS — Z794 Long term (current) use of insulin: Secondary | ICD-10-CM | POA: Diagnosis not present

## 2019-04-11 DIAGNOSIS — I132 Hypertensive heart and chronic kidney disease with heart failure and with stage 5 chronic kidney disease, or end stage renal disease: Secondary | ICD-10-CM | POA: Diagnosis not present

## 2019-04-11 DIAGNOSIS — N186 End stage renal disease: Secondary | ICD-10-CM | POA: Diagnosis not present

## 2019-04-12 DIAGNOSIS — D509 Iron deficiency anemia, unspecified: Secondary | ICD-10-CM | POA: Diagnosis not present

## 2019-04-12 DIAGNOSIS — N2581 Secondary hyperparathyroidism of renal origin: Secondary | ICD-10-CM | POA: Diagnosis not present

## 2019-04-12 DIAGNOSIS — D631 Anemia in chronic kidney disease: Secondary | ICD-10-CM | POA: Diagnosis not present

## 2019-04-12 DIAGNOSIS — N186 End stage renal disease: Secondary | ICD-10-CM | POA: Diagnosis not present

## 2019-04-12 DIAGNOSIS — Z992 Dependence on renal dialysis: Secondary | ICD-10-CM | POA: Diagnosis not present

## 2019-04-14 DIAGNOSIS — D509 Iron deficiency anemia, unspecified: Secondary | ICD-10-CM | POA: Diagnosis not present

## 2019-04-14 DIAGNOSIS — Z992 Dependence on renal dialysis: Secondary | ICD-10-CM | POA: Diagnosis not present

## 2019-04-14 DIAGNOSIS — N2581 Secondary hyperparathyroidism of renal origin: Secondary | ICD-10-CM | POA: Diagnosis not present

## 2019-04-14 DIAGNOSIS — D631 Anemia in chronic kidney disease: Secondary | ICD-10-CM | POA: Diagnosis not present

## 2019-04-14 DIAGNOSIS — N186 End stage renal disease: Secondary | ICD-10-CM | POA: Diagnosis not present

## 2019-04-17 DIAGNOSIS — N2581 Secondary hyperparathyroidism of renal origin: Secondary | ICD-10-CM | POA: Diagnosis not present

## 2019-04-17 DIAGNOSIS — Z992 Dependence on renal dialysis: Secondary | ICD-10-CM | POA: Diagnosis not present

## 2019-04-17 DIAGNOSIS — D509 Iron deficiency anemia, unspecified: Secondary | ICD-10-CM | POA: Diagnosis not present

## 2019-04-17 DIAGNOSIS — N186 End stage renal disease: Secondary | ICD-10-CM | POA: Diagnosis not present

## 2019-04-17 DIAGNOSIS — D631 Anemia in chronic kidney disease: Secondary | ICD-10-CM | POA: Diagnosis not present

## 2019-04-19 DIAGNOSIS — N2581 Secondary hyperparathyroidism of renal origin: Secondary | ICD-10-CM | POA: Diagnosis not present

## 2019-04-19 DIAGNOSIS — D509 Iron deficiency anemia, unspecified: Secondary | ICD-10-CM | POA: Diagnosis not present

## 2019-04-19 DIAGNOSIS — Z992 Dependence on renal dialysis: Secondary | ICD-10-CM | POA: Diagnosis not present

## 2019-04-19 DIAGNOSIS — D631 Anemia in chronic kidney disease: Secondary | ICD-10-CM | POA: Diagnosis not present

## 2019-04-19 DIAGNOSIS — N186 End stage renal disease: Secondary | ICD-10-CM | POA: Diagnosis not present

## 2019-04-21 DIAGNOSIS — D509 Iron deficiency anemia, unspecified: Secondary | ICD-10-CM | POA: Diagnosis not present

## 2019-04-21 DIAGNOSIS — D631 Anemia in chronic kidney disease: Secondary | ICD-10-CM | POA: Diagnosis not present

## 2019-04-21 DIAGNOSIS — Z992 Dependence on renal dialysis: Secondary | ICD-10-CM | POA: Diagnosis not present

## 2019-04-21 DIAGNOSIS — N2581 Secondary hyperparathyroidism of renal origin: Secondary | ICD-10-CM | POA: Diagnosis not present

## 2019-04-21 DIAGNOSIS — N186 End stage renal disease: Secondary | ICD-10-CM | POA: Diagnosis not present

## 2019-04-24 DIAGNOSIS — N2581 Secondary hyperparathyroidism of renal origin: Secondary | ICD-10-CM | POA: Diagnosis not present

## 2019-04-24 DIAGNOSIS — D509 Iron deficiency anemia, unspecified: Secondary | ICD-10-CM | POA: Diagnosis not present

## 2019-04-24 DIAGNOSIS — Z992 Dependence on renal dialysis: Secondary | ICD-10-CM | POA: Diagnosis not present

## 2019-04-24 DIAGNOSIS — D631 Anemia in chronic kidney disease: Secondary | ICD-10-CM | POA: Diagnosis not present

## 2019-04-24 DIAGNOSIS — N186 End stage renal disease: Secondary | ICD-10-CM | POA: Diagnosis not present

## 2019-04-26 DIAGNOSIS — D509 Iron deficiency anemia, unspecified: Secondary | ICD-10-CM | POA: Diagnosis not present

## 2019-04-26 DIAGNOSIS — D631 Anemia in chronic kidney disease: Secondary | ICD-10-CM | POA: Diagnosis not present

## 2019-04-26 DIAGNOSIS — N2581 Secondary hyperparathyroidism of renal origin: Secondary | ICD-10-CM | POA: Diagnosis not present

## 2019-04-26 DIAGNOSIS — N186 End stage renal disease: Secondary | ICD-10-CM | POA: Diagnosis not present

## 2019-04-26 DIAGNOSIS — Z992 Dependence on renal dialysis: Secondary | ICD-10-CM | POA: Diagnosis not present

## 2019-04-27 DIAGNOSIS — N186 End stage renal disease: Secondary | ICD-10-CM | POA: Diagnosis not present

## 2019-04-27 DIAGNOSIS — Z992 Dependence on renal dialysis: Secondary | ICD-10-CM | POA: Diagnosis not present

## 2019-04-27 DIAGNOSIS — I159 Secondary hypertension, unspecified: Secondary | ICD-10-CM | POA: Diagnosis not present

## 2019-04-28 DIAGNOSIS — N2581 Secondary hyperparathyroidism of renal origin: Secondary | ICD-10-CM | POA: Diagnosis not present

## 2019-04-28 DIAGNOSIS — D631 Anemia in chronic kidney disease: Secondary | ICD-10-CM | POA: Diagnosis not present

## 2019-04-28 DIAGNOSIS — Z992 Dependence on renal dialysis: Secondary | ICD-10-CM | POA: Diagnosis not present

## 2019-04-28 DIAGNOSIS — N186 End stage renal disease: Secondary | ICD-10-CM | POA: Diagnosis not present

## 2019-04-28 DIAGNOSIS — D509 Iron deficiency anemia, unspecified: Secondary | ICD-10-CM | POA: Diagnosis not present

## 2019-04-28 DIAGNOSIS — E876 Hypokalemia: Secondary | ICD-10-CM | POA: Diagnosis not present

## 2019-04-30 NOTE — Progress Notes (Signed)
Destiny Day - 68 y.o. female MRN [id number]  Date of birth: Apr 26, 1951  Office Visit Note: Visit Date: 04/06/2019 PCP: Velna Hatchet, MD Referred by: Velna Hatchet, MD  Subjective: Chief Complaint  Patient presents with  . Lower Back - Pain   HPI:  Destiny Day is a 68 y.o. female who comes in today For planned right L4 transforaminal epidural steroid injection at the request of Dr. Meridee Score.  Patient has a history of months of worsening back and right buttock and anterior thigh pain and a pretty classic L4 distribution.  She has disc herniation foraminally and laterally at L4-5 that could affect the L4 nerve root on the right.  She also has moderate multifactorial stenosis at this level.  She has milder findings at L5-S1 that could affect the right S1 nerve root.  Her case is complicated by significant diabetes as well as cardiovascular disease and congestive heart failure.  MRI review with the patient and with spine models.  ROS Otherwise per HPI.  Assessment & Plan: Visit Diagnoses:  1. Radiculopathy due to lumbar intervertebral disc disorder     Plan: No additional findings.   Meds & Orders:  Meds ordered this encounter  Medications  . betamethasone acetate-betamethasone sodium phosphate (CELESTONE) injection 12 mg    Orders Placed This Encounter  Procedures  . XR C-ARM NO REPORT  . Epidural Steroid injection    Follow-up: Return if symptoms worsen or fail to improve.   Procedures: No procedures performed  Lumbosacral Transforaminal Epidural Steroid Injection - Sub-Pedicular Approach with Fluoroscopic Guidance  Patient: Destiny Day      Date of Birth: [birth year] MRN: [id number] PCP: Velna Hatchet, MD      Visit Date: 04/06/2019   Universal Protocol:    Date/Time: 04/06/2019  Consent Given By: the patient  Position: PRONE  Additional Comments: Vital signs were monitored before and after the procedure. Patient was prepped and draped  in the usual sterile fashion. The correct patient, procedure, and site was verified.   Injection Procedure Details:  Procedure Site One Meds Administered:  Meds ordered this encounter  Medications  . betamethasone acetate-betamethasone sodium phosphate (CELESTONE) injection 12 mg    Laterality: Right  Location/Site:  L4-L5  Needle size: 22 G  Needle type: Spinal  Needle Placement: Transforaminal  Findings:    -Comments: Excellent flow of contrast along the nerve and into the epidural space.  Procedure Details: After squaring off the end-plates to get a true AP view, the C-arm was positioned so that an oblique view of the foramen as noted above was visualized. The target area is just inferior to the "nose of the scotty dog" or sub pedicular. The soft tissues overlying this structure were infiltrated with 2-3 ml. of 1% Lidocaine without Epinephrine.  The spinal needle was inserted toward the target using a "trajectory" view along the fluoroscope beam.  Under AP and lateral visualization, the needle was advanced so it did not puncture dura and was located close the 6 O'Clock position of the pedical in AP tracterory. Biplanar projections were used to confirm position. Aspiration was confirmed to be negative for CSF and/or blood. A 1-2 ml. volume of Isovue-250 was injected and flow of contrast was noted at each level. Radiographs were obtained for documentation purposes.   After attaining the desired flow of contrast documented above, a 0.5 to 1.0 ml test dose of 0.25% Marcaine was injected into each respective transforaminal space.  The patient was observed for  90 seconds post injection.  After no sensory deficits were reported, and normal lower extremity motor function was noted,   the above injectate was administered so that equal amounts of the injectate were placed at each foramen (level) into the transforaminal epidural space.   Additional Comments:  The patient tolerated the  procedure well Dressing: 2 x 2 sterile gauze and Band-Aid    Post-procedure details: Patient was observed during the procedure. Post-procedure instructions were reviewed.  Patient left the clinic in stable condition.     Clinical History: MRI LUMBAR SPINE WITHOUT CONTRAST  TECHNIQUE: Multiplanar, multisequence MR imaging of the lumbar spine was performed. No intravenous contrast was administered.  COMPARISON:  Lumbar radiographs 09/28/2018  FINDINGS: Segmentation:  Normal  Alignment:  Mild anterolisthesis L4-5 otherwise normal alignment  Vertebrae: Negative for fracture or mass. Discogenic edema on the right at L5-S1 due to disc degeneration.  Conus medullaris and cauda equina: Conus extends to the L2 level. Conus and cauda equina appear normal.  Paraspinal and other soft tissues: Cholelithiasis  Heavily calcified aorta and iliac arteries without aneurysm. No retroperitoneal mass. Bilateral renal cysts.  Disc levels:  L1-2: Negative  L2-3: Left lateral osteophyte formation. Small associated extraforaminal disc protrusion. No significant foraminal encroachment. Spinal canal normal in size  L3-4: Small extraforaminal disc protrusion and associated osteophyte with displacement of the left L3 nerve root. No significant spinal or foraminal stenosis.  L4-5: Diffuse disc bulging. Small central and right foraminal disc protrusion. Displacement of the right L4 nerve root due to disc protrusion. Moderate facet degeneration. Moderate spinal stenosis. Moderate subarticular stenosis bilaterally.  L5-S1: Asymmetric disc degeneration on the right with disc space narrowing and endplate edema. Diffuse endplate spurring. Bilateral facet degeneration. Moderate subarticular stenosis on the left and mild subarticular stenosis on the right due to spurring.  IMPRESSION: Left lateral disc and osteophyte complex at L2-3 and L3-4  Central and right foraminal disc  protrusion L4-5. Displacement of the right L4 nerve root. Moderate spinal stenosis and moderate subarticular stenosis bilaterally  Asymmetric disc degeneration L5-S1 right greater than left with endplate edema. Mild subarticular stenosis on the right and moderate subarticular stenosis on the left at L5-S1 due to spurring.   Electronically Signed   By: Franchot Gallo M.D.   On: 03/07/2019 14:27     Objective:  VS:  HT:    WT:   BMI:     BP:(!) 186/100  HR:97bpm  TEMP: ( )  RESP:  Physical Exam  Ortho Exam Imaging: No results found.

## 2019-05-01 DIAGNOSIS — D631 Anemia in chronic kidney disease: Secondary | ICD-10-CM | POA: Diagnosis not present

## 2019-05-01 DIAGNOSIS — D509 Iron deficiency anemia, unspecified: Secondary | ICD-10-CM | POA: Diagnosis not present

## 2019-05-01 DIAGNOSIS — N186 End stage renal disease: Secondary | ICD-10-CM | POA: Diagnosis not present

## 2019-05-01 DIAGNOSIS — N2581 Secondary hyperparathyroidism of renal origin: Secondary | ICD-10-CM | POA: Diagnosis not present

## 2019-05-01 DIAGNOSIS — E876 Hypokalemia: Secondary | ICD-10-CM | POA: Diagnosis not present

## 2019-05-01 DIAGNOSIS — Z992 Dependence on renal dialysis: Secondary | ICD-10-CM | POA: Diagnosis not present

## 2019-05-02 ENCOUNTER — Other Ambulatory Visit: Payer: Self-pay | Admitting: *Deleted

## 2019-05-02 NOTE — Patient Outreach (Addendum)
[facility]) Care Management  05/02/2019  Destiny Day Aug 27, 1950 [id number]   RN Health Coach attempted follow up outreach call to patient.  Patient was unavailable. HIPPA compliance voicemail message left with return callback number.  Plan: RN sent unsuccessful outreach letter. If no return call back will close within 10 business days.  [place] Care Management [phone]

## 2019-05-03 DIAGNOSIS — D509 Iron deficiency anemia, unspecified: Secondary | ICD-10-CM | POA: Diagnosis not present

## 2019-05-03 DIAGNOSIS — E876 Hypokalemia: Secondary | ICD-10-CM | POA: Diagnosis not present

## 2019-05-03 DIAGNOSIS — N2581 Secondary hyperparathyroidism of renal origin: Secondary | ICD-10-CM | POA: Diagnosis not present

## 2019-05-03 DIAGNOSIS — Z992 Dependence on renal dialysis: Secondary | ICD-10-CM | POA: Diagnosis not present

## 2019-05-03 DIAGNOSIS — N186 End stage renal disease: Secondary | ICD-10-CM | POA: Diagnosis not present

## 2019-05-03 DIAGNOSIS — E1122 Type 2 diabetes mellitus with diabetic chronic kidney disease: Secondary | ICD-10-CM | POA: Diagnosis not present

## 2019-05-03 DIAGNOSIS — D631 Anemia in chronic kidney disease: Secondary | ICD-10-CM | POA: Diagnosis not present

## 2019-05-04 ENCOUNTER — Other Ambulatory Visit: Payer: Self-pay

## 2019-05-04 DIAGNOSIS — Z20828 Contact with and (suspected) exposure to other viral communicable diseases: Secondary | ICD-10-CM | POA: Diagnosis not present

## 2019-05-04 DIAGNOSIS — Z20822 Contact with and (suspected) exposure to covid-19: Secondary | ICD-10-CM

## 2019-05-05 DIAGNOSIS — N186 End stage renal disease: Secondary | ICD-10-CM | POA: Diagnosis not present

## 2019-05-05 DIAGNOSIS — Z992 Dependence on renal dialysis: Secondary | ICD-10-CM | POA: Diagnosis not present

## 2019-05-05 DIAGNOSIS — D631 Anemia in chronic kidney disease: Secondary | ICD-10-CM | POA: Diagnosis not present

## 2019-05-05 DIAGNOSIS — N2581 Secondary hyperparathyroidism of renal origin: Secondary | ICD-10-CM | POA: Diagnosis not present

## 2019-05-05 DIAGNOSIS — D509 Iron deficiency anemia, unspecified: Secondary | ICD-10-CM | POA: Diagnosis not present

## 2019-05-05 DIAGNOSIS — E876 Hypokalemia: Secondary | ICD-10-CM | POA: Diagnosis not present

## 2019-05-06 LAB — NOVEL CORONAVIRUS, NAA: SARS-CoV-2, NAA: NOT DETECTED

## 2019-05-08 DIAGNOSIS — Z992 Dependence on renal dialysis: Secondary | ICD-10-CM | POA: Diagnosis not present

## 2019-05-08 DIAGNOSIS — D509 Iron deficiency anemia, unspecified: Secondary | ICD-10-CM | POA: Diagnosis not present

## 2019-05-08 DIAGNOSIS — E876 Hypokalemia: Secondary | ICD-10-CM | POA: Diagnosis not present

## 2019-05-08 DIAGNOSIS — D631 Anemia in chronic kidney disease: Secondary | ICD-10-CM | POA: Diagnosis not present

## 2019-05-08 DIAGNOSIS — N2581 Secondary hyperparathyroidism of renal origin: Secondary | ICD-10-CM | POA: Diagnosis not present

## 2019-05-08 DIAGNOSIS — N186 End stage renal disease: Secondary | ICD-10-CM | POA: Diagnosis not present

## 2019-05-10 DIAGNOSIS — N2581 Secondary hyperparathyroidism of renal origin: Secondary | ICD-10-CM | POA: Diagnosis not present

## 2019-05-10 DIAGNOSIS — D631 Anemia in chronic kidney disease: Secondary | ICD-10-CM | POA: Diagnosis not present

## 2019-05-10 DIAGNOSIS — D509 Iron deficiency anemia, unspecified: Secondary | ICD-10-CM | POA: Diagnosis not present

## 2019-05-10 DIAGNOSIS — Z992 Dependence on renal dialysis: Secondary | ICD-10-CM | POA: Diagnosis not present

## 2019-05-10 DIAGNOSIS — E876 Hypokalemia: Secondary | ICD-10-CM | POA: Diagnosis not present

## 2019-05-10 DIAGNOSIS — N186 End stage renal disease: Secondary | ICD-10-CM | POA: Diagnosis not present

## 2019-05-12 DIAGNOSIS — N2581 Secondary hyperparathyroidism of renal origin: Secondary | ICD-10-CM | POA: Diagnosis not present

## 2019-05-12 DIAGNOSIS — N186 End stage renal disease: Secondary | ICD-10-CM | POA: Diagnosis not present

## 2019-05-12 DIAGNOSIS — D509 Iron deficiency anemia, unspecified: Secondary | ICD-10-CM | POA: Diagnosis not present

## 2019-05-12 DIAGNOSIS — D631 Anemia in chronic kidney disease: Secondary | ICD-10-CM | POA: Diagnosis not present

## 2019-05-12 DIAGNOSIS — E876 Hypokalemia: Secondary | ICD-10-CM | POA: Diagnosis not present

## 2019-05-12 DIAGNOSIS — Z992 Dependence on renal dialysis: Secondary | ICD-10-CM | POA: Diagnosis not present

## 2019-05-15 DIAGNOSIS — E876 Hypokalemia: Secondary | ICD-10-CM | POA: Diagnosis not present

## 2019-05-15 DIAGNOSIS — D509 Iron deficiency anemia, unspecified: Secondary | ICD-10-CM | POA: Diagnosis not present

## 2019-05-15 DIAGNOSIS — D631 Anemia in chronic kidney disease: Secondary | ICD-10-CM | POA: Diagnosis not present

## 2019-05-15 DIAGNOSIS — N2581 Secondary hyperparathyroidism of renal origin: Secondary | ICD-10-CM | POA: Diagnosis not present

## 2019-05-15 DIAGNOSIS — N186 End stage renal disease: Secondary | ICD-10-CM | POA: Diagnosis not present

## 2019-05-15 DIAGNOSIS — Z992 Dependence on renal dialysis: Secondary | ICD-10-CM | POA: Diagnosis not present

## 2019-05-16 ENCOUNTER — Ambulatory Visit: Payer: Self-pay | Admitting: *Deleted

## 2019-05-16 ENCOUNTER — Other Ambulatory Visit: Payer: Self-pay | Admitting: *Deleted

## 2019-05-16 NOTE — Patient Outreach (Signed)
[facility]) Care Management  05/16/2019  JARIYA REICHOW 1951/02/20 [id number]   Multiple attempts to establish contact with patient without success. No response from letter mailed to patient. Case is being closed at this time.  Plan: RN Health Coach case closure Closure letter sent to patient and PCP  [place] Management [phone]

## 2019-05-17 ENCOUNTER — Emergency Department (HOSPITAL_COMMUNITY): Payer: Medicare Other

## 2019-05-17 ENCOUNTER — Encounter (HOSPITAL_COMMUNITY): Payer: Self-pay | Admitting: Emergency Medicine

## 2019-05-17 ENCOUNTER — Inpatient Hospital Stay (HOSPITAL_COMMUNITY)
Admission: EM | Admit: 2019-05-17 | Discharge: 2019-05-22 | DRG: 177 | Disposition: A | Discharge status: Home / Self Care | Payer: Medicare Other | Attending: Internal Medicine | Admitting: Internal Medicine

## 2019-05-17 DIAGNOSIS — R509 Fever, unspecified: Secondary | ICD-10-CM | POA: Diagnosis not present

## 2019-05-17 DIAGNOSIS — I132 Hypertensive heart and chronic kidney disease with heart failure and with stage 5 chronic kidney disease, or end stage renal disease: Secondary | ICD-10-CM | POA: Diagnosis present

## 2019-05-17 DIAGNOSIS — Z89432 Acquired absence of left foot: Secondary | ICD-10-CM | POA: Diagnosis not present

## 2019-05-17 DIAGNOSIS — J9601 Acute respiratory failure with hypoxia: Secondary | ICD-10-CM | POA: Diagnosis present

## 2019-05-17 DIAGNOSIS — F29 Unspecified psychosis not due to a substance or known physiological condition: Secondary | ICD-10-CM | POA: Diagnosis not present

## 2019-05-17 DIAGNOSIS — Z794 Long term (current) use of insulin: Secondary | ICD-10-CM | POA: Diagnosis not present

## 2019-05-17 DIAGNOSIS — E1151 Type 2 diabetes mellitus with diabetic peripheral angiopathy without gangrene: Secondary | ICD-10-CM | POA: Diagnosis present

## 2019-05-17 DIAGNOSIS — I120 Hypertensive chronic kidney disease with stage 5 chronic kidney disease or end stage renal disease: Secondary | ICD-10-CM | POA: Diagnosis not present

## 2019-05-17 DIAGNOSIS — I5032 Chronic diastolic (congestive) heart failure: Secondary | ICD-10-CM | POA: Diagnosis present

## 2019-05-17 DIAGNOSIS — Z888 Allergy status to other drugs, medicaments and biological substances status: Secondary | ICD-10-CM

## 2019-05-17 DIAGNOSIS — N2581 Secondary hyperparathyroidism of renal origin: Secondary | ICD-10-CM | POA: Diagnosis not present

## 2019-05-17 DIAGNOSIS — N186 End stage renal disease: Secondary | ICD-10-CM | POA: Diagnosis present

## 2019-05-17 DIAGNOSIS — J189 Pneumonia, unspecified organism: Secondary | ICD-10-CM | POA: Diagnosis present

## 2019-05-17 DIAGNOSIS — E876 Hypokalemia: Secondary | ICD-10-CM | POA: Diagnosis present

## 2019-05-17 DIAGNOSIS — R112 Nausea with vomiting, unspecified: Secondary | ICD-10-CM | POA: Diagnosis not present

## 2019-05-17 DIAGNOSIS — I4581 Long QT syndrome: Secondary | ICD-10-CM | POA: Diagnosis present

## 2019-05-17 DIAGNOSIS — I10 Essential (primary) hypertension: Secondary | ICD-10-CM | POA: Diagnosis not present

## 2019-05-17 DIAGNOSIS — J302 Other seasonal allergic rhinitis: Secondary | ICD-10-CM | POA: Diagnosis present

## 2019-05-17 DIAGNOSIS — U071 COVID-19: Secondary | ICD-10-CM | POA: Diagnosis not present

## 2019-05-17 DIAGNOSIS — M810 Age-related osteoporosis without current pathological fracture: Secondary | ICD-10-CM | POA: Diagnosis present

## 2019-05-17 DIAGNOSIS — E1121 Type 2 diabetes mellitus with diabetic nephropathy: Secondary | ICD-10-CM | POA: Diagnosis not present

## 2019-05-17 DIAGNOSIS — Z743 Need for continuous supervision: Secondary | ICD-10-CM | POA: Diagnosis not present

## 2019-05-17 DIAGNOSIS — R0902 Hypoxemia: Secondary | ICD-10-CM | POA: Diagnosis not present

## 2019-05-17 DIAGNOSIS — N250 Renal osteodystrophy: Secondary | ICD-10-CM | POA: Diagnosis present

## 2019-05-17 DIAGNOSIS — Z992 Dependence on renal dialysis: Secondary | ICD-10-CM | POA: Diagnosis not present

## 2019-05-17 DIAGNOSIS — I159 Secondary hypertension, unspecified: Secondary | ICD-10-CM | POA: Diagnosis present

## 2019-05-17 DIAGNOSIS — Z95820 Peripheral vascular angioplasty status with implants and grafts: Secondary | ICD-10-CM | POA: Diagnosis not present

## 2019-05-17 DIAGNOSIS — G4730 Sleep apnea, unspecified: Secondary | ICD-10-CM | POA: Diagnosis present

## 2019-05-17 DIAGNOSIS — Z79899 Other long term (current) drug therapy: Secondary | ICD-10-CM

## 2019-05-17 DIAGNOSIS — Z209 Contact with and (suspected) exposure to unspecified communicable disease: Secondary | ICD-10-CM | POA: Diagnosis not present

## 2019-05-17 DIAGNOSIS — E1165 Type 2 diabetes mellitus with hyperglycemia: Secondary | ICD-10-CM | POA: Diagnosis not present

## 2019-05-17 DIAGNOSIS — J1289 Other viral pneumonia: Secondary | ICD-10-CM | POA: Diagnosis present

## 2019-05-17 DIAGNOSIS — Z20822 Contact with and (suspected) exposure to covid-19: Secondary | ICD-10-CM

## 2019-05-17 DIAGNOSIS — E11649 Type 2 diabetes mellitus with hypoglycemia without coma: Secondary | ICD-10-CM | POA: Diagnosis not present

## 2019-05-17 DIAGNOSIS — R05 Cough: Secondary | ICD-10-CM | POA: Diagnosis not present

## 2019-05-17 DIAGNOSIS — E785 Hyperlipidemia, unspecified: Secondary | ICD-10-CM | POA: Diagnosis present

## 2019-05-17 DIAGNOSIS — Z7951 Long term (current) use of inhaled steroids: Secondary | ICD-10-CM

## 2019-05-17 DIAGNOSIS — Z90710 Acquired absence of both cervix and uterus: Secondary | ICD-10-CM

## 2019-05-17 DIAGNOSIS — E1122 Type 2 diabetes mellitus with diabetic chronic kidney disease: Secondary | ICD-10-CM | POA: Diagnosis present

## 2019-05-17 DIAGNOSIS — I739 Peripheral vascular disease, unspecified: Secondary | ICD-10-CM | POA: Diagnosis present

## 2019-05-17 DIAGNOSIS — E1142 Type 2 diabetes mellitus with diabetic polyneuropathy: Secondary | ICD-10-CM | POA: Diagnosis present

## 2019-05-17 DIAGNOSIS — D631 Anemia in chronic kidney disease: Secondary | ICD-10-CM | POA: Diagnosis not present

## 2019-05-17 DIAGNOSIS — E1159 Type 2 diabetes mellitus with other circulatory complications: Secondary | ICD-10-CM | POA: Diagnosis present

## 2019-05-17 DIAGNOSIS — Z87891 Personal history of nicotine dependence: Secondary | ICD-10-CM

## 2019-05-17 DIAGNOSIS — J449 Chronic obstructive pulmonary disease, unspecified: Secondary | ICD-10-CM | POA: Diagnosis present

## 2019-05-17 DIAGNOSIS — R279 Unspecified lack of coordination: Secondary | ICD-10-CM | POA: Diagnosis not present

## 2019-05-17 DIAGNOSIS — Z89431 Acquired absence of right foot: Secondary | ICD-10-CM | POA: Diagnosis not present

## 2019-05-17 DIAGNOSIS — Z7982 Long term (current) use of aspirin: Secondary | ICD-10-CM

## 2019-05-17 DIAGNOSIS — R52 Pain, unspecified: Secondary | ICD-10-CM | POA: Diagnosis not present

## 2019-05-17 DIAGNOSIS — Z833 Family history of diabetes mellitus: Secondary | ICD-10-CM

## 2019-05-17 DIAGNOSIS — F411 Generalized anxiety disorder: Secondary | ICD-10-CM | POA: Diagnosis present

## 2019-05-17 DIAGNOSIS — J188 Other pneumonia, unspecified organism: Secondary | ICD-10-CM | POA: Diagnosis present

## 2019-05-17 DIAGNOSIS — D509 Iron deficiency anemia, unspecified: Secondary | ICD-10-CM | POA: Diagnosis not present

## 2019-05-17 LAB — CBC WITH DIFFERENTIAL/PLATELET
Abs Immature Granulocytes: 0.08 10*3/uL — ABNORMAL HIGH (ref 0.00–0.07)
Basophils Absolute: 0 10*3/uL (ref 0.0–0.1)
Basophils Relative: 0 %
Eosinophils Absolute: 0 10*3/uL (ref 0.0–0.5)
Eosinophils Relative: 0 %
HCT: 32.1 % — ABNORMAL LOW (ref 36.0–46.0)
Hemoglobin: 10.3 g/dL — ABNORMAL LOW (ref 12.0–15.0)
Immature Granulocytes: 2 %
Lymphocytes Relative: 17 %
Lymphs Abs: 0.8 10*3/uL (ref 0.7–4.0)
MCH: 31 pg (ref 26.0–34.0)
MCHC: 32.1 g/dL (ref 30.0–36.0)
MCV: 96.7 fL (ref 80.0–100.0)
Monocytes Absolute: 0.2 10*3/uL (ref 0.1–1.0)
Monocytes Relative: 5 %
Neutro Abs: 3.7 10*3/uL (ref 1.7–7.7)
Neutrophils Relative %: 76 %
Platelets: 175 10*3/uL (ref 150–400)
RBC: 3.32 MIL/uL — ABNORMAL LOW (ref 3.87–5.11)
RDW: 13.2 % (ref 11.5–15.5)
WBC: 4.8 10*3/uL (ref 4.0–10.5)
nRBC: 0.4 % — ABNORMAL HIGH (ref 0.0–0.2)

## 2019-05-17 LAB — COMPREHENSIVE METABOLIC PANEL
ALT: 17 U/L (ref 0–44)
AST: 35 U/L (ref 15–41)
Albumin: 3 g/dL — ABNORMAL LOW (ref 3.5–5.0)
Alkaline Phosphatase: 138 U/L — ABNORMAL HIGH (ref 38–126)
Anion gap: 13 (ref 5–15)
BUN: 9 mg/dL (ref 8–23)
CO2: 30 mmol/L (ref 22–32)
Calcium: 8 mg/dL — ABNORMAL LOW (ref 8.9–10.3)
Chloride: 94 mmol/L — ABNORMAL LOW (ref 98–111)
Creatinine, Ser: 3.31 mg/dL — ABNORMAL HIGH (ref 0.44–1.00)
GFR calc Af Amer: 16 mL/min — ABNORMAL LOW (ref >60)
GFR calc non Af Amer: 14 mL/min — ABNORMAL LOW (ref >60)
Glucose, Bld: 189 mg/dL — ABNORMAL HIGH (ref 70–99)
Potassium: 2.2 mmol/L — CL (ref 3.5–5.1)
Sodium: 137 mmol/L (ref 135–145)
Total Bilirubin: 0.9 mg/dL (ref 0.3–1.2)
Total Protein: 6.8 g/dL (ref 6.5–8.1)

## 2019-05-17 LAB — APTT: aPTT: 43 seconds — ABNORMAL HIGH (ref 24–36)

## 2019-05-17 LAB — PROTIME-INR
INR: 1.1 (ref 0.8–1.2)
Prothrombin Time: 13.8 seconds (ref 11.4–15.2)

## 2019-05-17 LAB — MAGNESIUM: Magnesium: 1.8 mg/dL (ref 1.7–2.4)

## 2019-05-17 LAB — LACTIC ACID, PLASMA: Lactic Acid, Venous: 1.6 mmol/L (ref 0.5–1.9)

## 2019-05-17 MED ORDER — POTASSIUM CHLORIDE CRYS ER 20 MEQ PO TBCR
20.0000 meq | EXTENDED_RELEASE_TABLET | Freq: Once | ORAL | Status: AC
  Administered 2019-05-18: 20 meq via ORAL
  Filled 2019-05-17: qty 1

## 2019-05-17 MED ORDER — ACETAMINOPHEN 325 MG PO TABS
650.0000 mg | ORAL_TABLET | Freq: Once | ORAL | Status: AC
  Administered 2019-05-17: 650 mg via ORAL
  Filled 2019-05-17: qty 2

## 2019-05-17 MED ORDER — ONDANSETRON 4 MG PO TBDP
4.0000 mg | ORAL_TABLET | Freq: Once | ORAL | Status: AC
  Administered 2019-05-17: 4 mg via ORAL
  Filled 2019-05-17: qty 1

## 2019-05-17 NOTE — ED Triage Notes (Signed)
Pt arrived via EMS from Home . Pt reports she went to Dialysis today and was told she had a fever of 99 . Pt was advised to go to [facility] for COVID -19 test. Pt called 911 from home for transport to ED. EMS reported Pt had a temp of 102 ,O2 sats 96 % 2 liter of nasal  . Pt reports feeling sick for 2 weeks and a cough for 2 weeks.

## 2019-05-17 NOTE — ED Notes (Addendum)
This RN unable to get 2nd set of blood cultures, [place] PA aware. States she does not make much urine, aware a urine sample is needed and will provide one if able.

## 2019-05-17 NOTE — ED Provider Notes (Signed)
[facility] EMERGENCY DEPARTMENT Provider Note   CSN: [id number] Arrival date & time: 05/17/19  1748     History   Chief Complaint Chief Complaint  Patient presents with  . Fever    HPI Destiny Day is a 68 y.o. female with history of ESRD on dialysis Monday Wednesday Friday, diabetes mellitus, CHF, GERD, hypertension, hyperlipidemia, peripheral neuropathy presenting today for evaluation of acute onset, progressively worsening cough for 1 week.  Reports has been tested positive for Covid recently and was hospitalized for this, discharged on Saturday.  She states that since then they have been living on separate floors and she has not seen him much.  She notes cough productive of yellow-green sputum.  Denies chest pain or shortness of breath.  Today when she had dialysis noted to have a temp of 20 F and was encouraged to go to [facility] for testing.  Reportedly this was unavailable when she drove up so she went home and called EMS who transported her here.  They noted that she was febrile, temp of 101.7 F here.  She has had 2 episodes of nonbloody nonbilious emesis today and one episode last week.  Denies abdominal pain.  She does produce some urine still, denies any urinary symptoms.  Has not tried anything for her symptoms.  Has been compliant with dialysis, received full treatment today.     The history is provided by the patient.    Past Medical History:  Diagnosis Date  . Abdominal bruit   . Anemia   . Anxiety   . Arthritis    Osteoarthritis  . Asthma   . Cervical disc disease    "pinced nerve"  . CHF (congestive heart failure) ([place])   . Complication of anesthesia    " after I got home from my last procedure, I started itching."  . Diabetes mellitus    Type II  . Diverticulitis   . ESRD (end stage renal disease) ([place])    dialysis - M/W/F- [place]  . GERD (gastroesophageal reflux disease)    from medications  . GI bleed 03/31/2013  . Head  injury 07/2017  . History of hiatal hernia   . Hyperlipidemia   . Hypertension   . Neuropathy    left leg  . Osteoporosis   . Peripheral vascular disease ([place])   . Pneumonia    "very young" and a few years ago  . PONV (postoperative nausea and vomiting)   . Seasonal allergies   . Shortness of breath dyspnea    WIth exertion, when fluid builds  . Sleep apnea    can't afford cpap     Patient Active Problem List   Diagnosis Date Noted  . CAP (community acquired pneumonia) 05/17/2019  . Fluid overload 06/18/2018  . Old bucket handle tear of lateral meniscus of right knee   . Old peripheral tear of medial meniscus of right knee   . Septic arthritis of knee, right ([place]) 05/06/2018  . Degenerative tear of posterior horn of medial meniscus, right   . Old complex tear of lateral meniscus of right knee   . Loose body in knee, right   . Acute pulmonary edema ([place]) 02/21/2018  . 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[place]) 02/03/2018  . End-stage renal disease on hemodialysis ([place])   . Respiratory failure ([place]) 01/21/2018  . Achilles tendon contracture, right 11/09/2017  . Labile blood glucose   . Anemia of infection and chronic disease   .  Essential hypertension   . Black stool   . Right sided weakness   . Subdural hemorrhage following injury ([place]) 08/30/2017  . Diabetic peripheral neuropathy ([place])   . Chronic obstructive pulmonary disease ([place])   . Seizure prophylaxis   . Subdural hematoma ([place]) 08/24/2017  . Traumatic subdural hematoma ([place]) 08/23/2017  . Fall   . SDH (subdural hematoma) ([place])   . Acute respiratory failure with hypoxia ([place])   . Diabetes mellitus type 2 in nonobese (HCC)   . Leukocytosis   . Labile blood pressure   . Generalized anxiety disorder   . Debility 08/16/2017  . Amputated great toe of right foot ([place])   . Amputated toe of left foot ([place])   . Post-operative pain   . ESRD on dialysis ([place])   . Diabetes mellitus ([place])   . Anxiety  state   . Benign essential HTN   . Acute blood loss anemia   . Anemia of chronic disease   . Idiopathic chronic venous hypertension of both lower extremities with inflammation 10/13/2016  . S/P transmetatarsal amputation of foot, left ([place]) 09/21/2016  . Onychomycosis 08/15/2016  . PAD (peripheral artery disease) ([place]) 07/08/2016  . Atherosclerosis of native artery of left lower extremity with gangrene ([place]) 06/23/2016  . GERD (gastroesophageal reflux disease) 10/07/2015  . ARF (acute renal failure) ([place])   . Hypothermia 06/12/2015  . Acute on chronic renal failure ([place]) 06/12/2015  . CHF (congestive heart failure) ([place]) 07/30/2014  . CKD (chronic kidney disease) stage 4, GFR 15-29 ml/min (HCC) 06/27/2014  . Hypertensive heart disease 05/25/2013  . CKD (chronic kidney disease) stage 3, GFR 30-59 ml/min 05/25/2013  . Pulmonary edema 05/23/2013  . Type 2 diabetes mellitus with diabetic nephropathy ([place]) 05/23/2013  . Type 2 diabetes mellitus with hyperosmolar nonketotic hyperglycemia ([place]) 04/13/2013  . Acute on chronic diastolic heart failure ([place]) 04/13/2013  . UGI bleed 03/31/2013  . Hypokalemia 08/24/2012  . Type II or unspecified type diabetes mellitus without mention of complication, not stated as uncontrolled 12/27/2007  . HLD (hyperlipidemia) 12/27/2007  . Allergic rhinitis due to pollen 12/27/2007  . Asthma 12/27/2007    Past Surgical History:  Procedure Laterality Date  . A/V SHUNTOGRAM N/A 09/22/2016   Procedure: A/V Shuntogram - left arm;  Surgeon: Serafina Mitchell, MD;  Location: [place] CV LAB;  Service: Cardiovascular;  Laterality: N/A;  . A/V SHUNTOGRAM N/A 03/22/2018   Procedure: A/V SHUNTOGRAM - left arm;  Surgeon: Serafina Mitchell, MD;  Location: [place] CV LAB;  Service: Cardiovascular;  Laterality: N/A;  . A/V SHUNTOGRAM Left 11/17/2018   Procedure: A/V SHUNTOGRAM;  Surgeon: Angelia Mould, MD;  Location: [place] CV LAB;  Service: Cardiovascular;   Laterality: Left;  . ABDOMINAL HYSTERECTOMY  1993`  . AMPUTATION Left 09/01/2016   Procedure: LEFT FOOT TRANSMETATARSAL AMPUTATION;  Surgeon: Newt Minion, MD;  Location: [facility];  Service: Orthopedics;  Laterality: Left;  . AMPUTATION Right 08/11/2017   Procedure: RIGHT GREAT TOE AMPUTATION DIGIT;  Surgeon: Rosetta Posner, MD;  Location: [place];  Service: Vascular;  Laterality: Right;  . AMPUTATION Right 12/31/2017   Procedure: RIGHT TRANSMETATARSAL AMPUTATION;  Surgeon: Newt Minion, MD;  Location: [facility];  Service: Orthopedics;  Laterality: Right;  . AV FISTULA PLACEMENT Left 04/21/2016   Procedure: INSERTION OF ARTERIOVENOUS (AV) GORE-TEX GRAFT ARM LEFT;  Surgeon: Elam Dutch, MD;  Location: [facility];  Service: Vascular;  Laterality: Left;  . BASCILIC VEIN TRANSPOSITION Left  07/10/2014   Procedure: BASCILIC VEIN TRANSPOSITION;  Surgeon: Angelia Mould, MD;  Location: [place];  Service: Vascular;  Laterality: Left;  . BASCILIC VEIN TRANSPOSITION Right 11/08/2014   Procedure: FIRST STAGE BASILIC VEIN TRANSPOSITION;  Surgeon: Angelia Mould, MD;  Location: [place];  Service: Vascular;  Laterality: Right;  . BASCILIC VEIN TRANSPOSITION Right 01/18/2015   Procedure: SECOND STAGE BASILIC VEIN TRANSPOSITION;  Surgeon: Angelia Mould, MD;  Location: [facility];  Service: Vascular;  Laterality: Right;  . CRANIOTOMY N/A 08/23/2017   Procedure: CRANIOTOMY HEMATOMA EVACUATION SUBDURAL;  Surgeon: Ashok Pall, MD;  Location: [facility];  Service: Neurosurgery;  Laterality: N/A;  . CRANIOTOMY Left 08/24/2017   Procedure: CRANIOTOMY FOR RECURRENT ACUTE SUBDURAL HEMATOMA;  Surgeon: Ashok Pall, MD;  Location: [facility];  Service: Neurosurgery;  Laterality: Left;  . ESOPHAGOGASTRODUODENOSCOPY N/A 03/31/2013   Procedure: ESOPHAGOGASTRODUODENOSCOPY (EGD);  Surgeon: Gatha Mayer, MD;  Location: [facility] ENDOSCOPY;  Service: Endoscopy;  Laterality: N/A;  . EYE SURGERY     laser surgery  . FEMORAL-POPLITEAL BYPASS  GRAFT Left 07/08/2016   Procedure: LEFT  FEMORAL-BELOW KNEE POPLITEAL ARTERY BYPASS GRAFT USING 6MM X 80 CM PROPATEN GORETEX GRAFT WITH RINGS.;  Surgeon: Rosetta Posner, MD;  Location: [facility];  Service: Vascular;  Laterality: Left;  . FEMORAL-POPLITEAL BYPASS GRAFT Right 08/11/2017   Procedure: RIGHT FEMORAL TO BELOW KNEE POPLITEAL ARTERKY  BYPASS GRAFT USING 6MM RINGED PROPATEN GRAFT;  Surgeon: Rosetta Posner, MD;  Location: [place];  Service: Vascular;  Laterality: Right;  . FISTULOGRAM Left 10/29/2014   Procedure: FISTULOGRAM;  Surgeon: Angelia Mould, MD;  Location: [facility] CATH LAB;  Service: Cardiovascular;  Laterality: Left;  Marland Kitchen GAS/FLUID EXCHANGE Left 12/20/2018   Procedure: AIR GAS EXCHANGE;  Surgeon: Jalene Mullet, MD;  Location: [facility];  Service: Ophthalmology;  Laterality: Left;  . KNEE ARTHROSCOPY Right 05/06/2018   Procedure: RIGHT KNEE ARTHROSCOPY;  Surgeon: Newt Minion, MD;  Location: [place];  Service: Orthopedics;  Laterality: Right;  . KNEE ARTHROSCOPY Right 06/10/2018   Procedure: RIGHT KNEE ARTHROSCOPY AND DEBRIDEMENT;  Surgeon: Newt Minion, MD;  Location: [place];  Service: Orthopedics;  Laterality: Right;  . LIGATION OF ARTERIOVENOUS  FISTULA Left 04/21/2016   Procedure: LIGATION OF ARTERIOVENOUS  FISTULA LEFT ARM;  Surgeon: Elam Dutch, MD;  Location: [place];  Service: Vascular;  Laterality: Left;  . LOWER EXTREMITY ANGIOGRAPHY N/A 04/06/2017   Procedure: Lower Extremity Angiography - Right;  Surgeon: Serafina Mitchell, MD;  Location: [place] [facility] LAB;  Service: Cardiovascular;  Laterality: N/A;  . MEMBRANE PEEL Left 12/20/2018   Procedure: MEMBRANE PEEL;  Surgeon: Jalene Mullet, MD;  Location: [facility] [place];  Service: Ophthalmology;  Laterality: Left;  . PATCH ANGIOPLASTY Right 01/18/2015   Procedure: BASILIC VEIN PATCH ANGIOPLASTY USING VASCUGUARD PATCH;  Surgeon: Angelia Mould, MD;  Location: [place];  Service: Vascular;  Laterality: Right;  . PERIPHERAL VASCULAR BALLOON  ANGIOPLASTY  09/22/2016   Procedure: Peripheral Vascular Balloon Angioplasty;  Surgeon: Serafina Mitchell, MD;  Location: [place] [facility];  Service: Cardiovascular;;  Lt. Fistula  . PERIPHERAL VASCULAR BALLOON ANGIOPLASTY Left 03/22/2018   Procedure: PERIPHERAL VASCULAR BALLOON ANGIOPLASTY;  Surgeon: Serafina Mitchell, MD;  Location: [place] CV LAB;  Service: Cardiovascular;  Laterality: Left;  Arm shunt  . PERIPHERAL VASCULAR CATHETERIZATION N/A 06/23/2016   Procedure: Abdominal Aortogram w/Lower Extremity;  Surgeon: Serafina Mitchell, MD;  Location: [place] CV LAB;  Service: Cardiovascular;  Laterality: N/A;  . PERIPHERAL VASCULAR CATHETERIZATION  06/23/2016   Procedure: Peripheral Vascular Intervention;  Surgeon: Serafina Mitchell, MD;  Location: [place] [facility];  Service: Cardiovascular;;  lt common and external illiac artery  . PHOTOCOAGULATION WITH LASER Left 12/20/2018   Procedure: PHOTOCOAGULATION WITH LASER;  Surgeon: Jalene Mullet, MD;  Location: [place];  Service: Ophthalmology;  Laterality: Left;  . 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      Home Medications    Prior to Admission medications   Medication Sig Start Date End Date Taking? Authorizing Provider  albuterol (PROVENTIL HFA;VENTOLIN HFA) 108 (90 Base) MCG/ACT inhaler Inhale 1-2 puffs into the lungs every 6 (six) hours as needed for wheezing or shortness of breath. 01/23/18   Allie Bossier, MD  amLODipine (NORVASC) 10 MG tablet Take 10 mg by mouth at bedtime.    [provider]  aspirin EC 81 MG tablet Take 81 mg by mouth daily.    [provider]  atorvastatin (LIPITOR) 20 MG tablet TAKE 1 TABLET BY MOUTH EVERY DAY 04/11/19   Adrian Prows, MD  budesonide-formoterol [facility])  160-4.5 MCG/ACT inhaler Inhale 1 puff into the lungs daily as needed (respiratory issues.).     [provider]  carvedilol (COREG) 12.5 MG tablet Take 12.5 mg by mouth See admin instructions. Take 1 tablet (12.5 mg) by mouth once daily in the evening on Mondays, Wednesdays, & Fridays. (Dialysis) Take 1 tablet (12.5 mg) by mouth twice daily on Sundays, Tuesdays, Thursdays, & Saturdays. (Non Dialysis)    [provider]  cinacalcet (SENSIPAR) 90 MG tablet Take 90 mg by mouth daily. Take 1 tablet (90 mg) by mouth in the evening on Mondays, Wednesdays, & Fridays (dialysis) Take 1 tablet (90 mg) by mouth twice daily on Sundays, Tuesdays, Thursdays, Saturdays. (non dialysis) 11/28/18   [provider]  diazepam (VALIUM) 5 MG tablet Take 5 mg by mouth 2 (two) times daily as needed for anxiety.    [provider]  ferric citrate (AURYXIA) 1 GM 210 MG(Fe) tablet Take 210-420 mg by mouth See admin instructions. Take 2 tablets (420 mg) by mouth with each meal & take 1 tablet (210 mg) by mouth with snacks.    [provider]  fluticasone (FLONASE) 50 MCG/ACT nasal spray Place 1 spray into both nostrils daily as needed for allergies or rhinitis.    [provider]  gabapentin (NEURONTIN) 100 MG capsule TAKE 1 CAPSULE (100 MG TOTAL) BY MOUTH AT BEDTIME. WHEN NECESSARY FOR NEUROPATHY PAIN 12/13/18   Newt Minion, MD  guaiFENesin (ROBITUSSIN) 100 MG/5ML liquid Take 100 mg by mouth every 12 (twelve) hours as needed for cough.    [provider]  hydrocerin (EUCERIN) CREA Apply 277 application topically 2 (two) times daily. Patient taking differently: Apply 1 application topically 2 (two) times daily.  09/16/17   Love, Ivan Anchors, PA-C  insulin NPH Human (HUMULIN N,NOVOLIN N) 100 UNIT/ML injection Inject 0.1 mLs (10 Units total) into the skin at bedtime. 01/23/18   Allie Bossier, MD  insulin regular (NOVOLIN R,HUMULIN R) 100 units/mL injection Inject 3-8 Units  into the skin See admin instructions. 3 times daily with meals on Sundays, Tuesdays, Thursdays, & Saturdays per sliding scale  [provider]  isosorbide-hydrALAZINE (BIDIL) 20-37.5 MG tablet Take 1 tablet by mouth 3 (three) times daily. On NON dialysis days pt takes 1 tab 3 times daily, and on Dialysis days, pt takes 1 tab every evning Patient taking differently: Take 1 tablet by mouth See admin instructions. Take 1 tablet by mouth in the evening on Mondays, Wednesdays, & Fridays. (Dialysis) Take 1 tablet 3 times daily on Sundays, Tuesdays, Thursdays, & Saturdays. (Non-Dialysis) 12/06/18   Miquel Dunn, NP  lidocaine-prilocaine (EMLA) cream Apply 1 application topically 3 (three) times a week. 1-2 hours prior to dialysis 01/07/18   [provider]  loratadine (CLARITIN) 10 MG tablet Take 10 mg by mouth daily as needed for allergies.    [provider]  multivitamin (RENA-VIT) TABS tablet Take 1 tablet by mouth daily.  01/24/15   [provider]  omeprazole (PRILOSEC) 20 MG capsule Take 20 mg by mouth daily.     [provider]  oxyCODONE-acetaminophen (PERCOCET/ROXICET) 5-325 MG tablet Take 1 tablet by mouth every 6 (six) hours as needed for severe pain. 02/02/19   Newt Minion, MD  senna (SENOKOT) 8.6 MG tablet Take 1 tablet by mouth as needed for constipation.    [provider]  silver sulfADIAZINE (SILVADENE) 1 % cream Apply 1 application topically daily. 02/16/19   Chase Picket, MD  Vitamin D, Ergocalciferol, (DRISDOL) 1.25 MG (50000 UT) CAPS capsule Take 50,000 Units by mouth every Sunday. Takes on Sunday     [provider]    Family History Family History  Problem Relation Age of Onset  . Other Mother        not sure of cause of death  . Diabetes Father   . Pancreatic cancer Maternal Grandmother   . Colon cancer Neg Hx     Social History Social History   Tobacco Use  . Smoking status: Former Smoker     Packs/day: 0.35    Years: 40.00    Pack years: 14.00    Types: Cigarettes    Quit date: 05/10/2012    Years since quitting: 7.0  . Smokeless tobacco: Never Used  Substance Use Topics  . Alcohol use: No  . Drug use: No    Comment: marijuana; quit in early 1980's     Allergies   Prednisone and Lisinopril   Review of Systems Review of Systems  Constitutional: Positive for chills, fatigue and fever.  Respiratory: Positive for cough. Negative for shortness of breath.   Cardiovascular: Negative for chest pain.  Gastrointestinal: Positive for nausea and vomiting. Negative for abdominal pain, constipation and diarrhea.  All other systems reviewed and are negative.    Physical Exam Updated Vital Signs BP (!) 132/49   Pulse 93   Temp (!) 101.7 F (38.7 C) (Oral)   Resp (!) 21   SpO2 98%   Physical Exam Vitals signs and nursing note reviewed.  Constitutional:      General: She is not in acute distress.    Appearance: She is well-developed.  HENT:     Head: Normocephalic and atraumatic.     Mouth/Throat:     Mouth: Mucous membranes are dry.  Eyes:     General:        Right eye: No discharge.        Left eye: No discharge.     Conjunctiva/sclera: Conjunctivae normal.  Neck:     Vascular: No JVD.     Trachea: No tracheal deviation.  Cardiovascular:  Rate and Rhythm: Normal rate and regular rhythm.     Comments: AV fistula in the left upper extremity with palpable thrill Pulmonary:     Effort: Pulmonary effort is normal.     Comments: Scattered rales Abdominal:     General: Bowel sounds are normal. There is no distension.     Palpations: Abdomen is soft.     Tenderness: There is no abdominal tenderness. There is no guarding or rebound.  Skin:    General: Skin is warm and dry.     Findings: No erythema.  Neurological:     Mental Status: She is alert.  Psychiatric:        Behavior: Behavior normal.      ED Treatments / Results  Labs (all labs ordered are  listed, but only abnormal results are displayed) Labs Reviewed  COMPREHENSIVE METABOLIC PANEL - Abnormal; Notable for the following components:      Result Value   Potassium 2.2 (*)    Chloride 94 (*)    Glucose, Bld 189 (*)    Creatinine, Ser 3.31 (*)    Calcium 8.0 (*)    Albumin 3.0 (*)    Alkaline Phosphatase 138 (*)    GFR calc non Af Amer 14 (*)    GFR calc Af Amer 16 (*)    All other components within normal limits  CBC WITH DIFFERENTIAL/PLATELET - Abnormal; Notable for the following components:   RBC 3.32 (*)    Hemoglobin 10.3 (*)    HCT 32.1 (*)    nRBC 0.4 (*)    Abs Immature Granulocytes 0.08 (*)    All other components within normal limits  APTT - Abnormal; Notable for the following components:   aPTT 43 (*)    All other components within normal limits  CULTURE, BLOOD (ROUTINE X 2)  URINE CULTURE  SARS CORONAVIRUS 2 (TAT 6-24 HRS)  LACTIC ACID, PLASMA  PROTIME-INR  MAGNESIUM  URINALYSIS, ROUTINE W REFLEX MICROSCOPIC    EKG EKG Interpretation  Date/Time:  Wednesday May 17 2019 18:39:20 EDT Ventricular Rate:  90 PR Interval:    QRS Duration: 98 QT Interval:  430 QTC Calculation: 527 R Axis:   28 Text Interpretation:  Sinus rhythm Probable left atrial enlargement Anteroseptal infarct, old Minimal ST depression, lateral leads Prolonged QT interval Baseline wander in lead(s) V2 V3 Since last tracing QT has lengthened otherwise, no change Confirmed by Daleen Bo [phone]) on 05/17/2019 8:11:40 PM   Radiology Dg Chest Port 1 View  Result Date: 05/17/2019 CLINICAL DATA:  Initial evaluation for acute cough, fever. EXAM: PORTABLE CHEST 1 VIEW COMPARISON:  Prior radiograph from 06/18/2018. FINDINGS: Mild cardiomegaly, stable. Mediastinal silhouette within normal limits. Aortic atherosclerosis. Lungs normally inflated. Patchy and hazy opacity within the right lung base, suspicious for possible infectious pneumonitis given history of cough and fever. Underlying  diffuse pulmonary vascular congestion with interstitial prominence, suggesting a degree of pulmonary interstitial edema. No obvious pleural effusion. No pneumothorax. No acute osseous finding. Osteopenia noted. IMPRESSION: 1. Patchy and hazy opacity within the right lung base, suspicious for acute infectious pneumonitis given history of cough and fever. 2. Underlying diffuse pulmonary vascular congestion with interstitial prominence, suggesting a degree of pulmonary interstitial edema. 3.  Aortic Atherosclerosis (ICD10-I70.0). Electronically Signed   By: Jeannine Boga M.D.   On: 05/17/2019 19:26    Procedures .Critical Care Performed by: Renita Papa, PA-C Authorized by: Renita Papa, PA-C   Critical care provider statement:    Critical  care time (minutes):  40   Critical care was necessary to treat or prevent imminent or life-threatening deterioration of the following conditions:  Respiratory failure   Critical care was time spent personally by me on the following activities:  Discussions with consultants, evaluation of patient's response to treatment, examination of patient, ordering and performing treatments and interventions, ordering and review of laboratory studies, ordering and review of radiographic studies, pulse oximetry, re-evaluation of patient's condition, obtaining history from patient or surrogate and review of old charts   (including critical care time)  Medications Ordered in ED Medications  potassium chloride SA (KLOR-CON) CR tablet 20 mEq (has no administration in time range)  acetaminophen (TYLENOL) tablet 650 mg (650 mg Oral Given 05/17/19 1943)  ondansetron (ZOFRAN-ODT) disintegrating tablet 4 mg (4 mg Oral Given 05/17/19 1943)     Initial Impression / Assessment and Plan / ED Course  I have reviewed the triage vital signs and the nursing notes.  Pertinent labs & imaging results that were available during my care of the patient were reviewed by me and  considered in my medical decision making (see chart for details).        Destiny Day was evaluated in Emergency Department on 05/17/2019 for the symptoms described in the history of present illness. She was evaluated in the context of the global COVID-19 pandemic, which necessitated consideration that the patient might be at risk for infection with the SARS-CoV-2 virus that causes COVID-19. Institutional protocols and algorithms that pertain to the evaluation of patients at risk for COVID-19 are in a state of rapid change based on information released by regulatory bodies including the CDC and federal and state organizations. These policies and algorithms were followed during the patient's care in the ED.  Patient presenting for evaluation of cough, fever, nausea vomiting.  Febrile to 101.7 F in the ED, intermittently hypertensive.  Abdomen soft and nontender on my assessment.  Lab work reviewed by me shows no leukocytosis, stable anemia, potassium of 2.2 but magnesium within normal limits.  Her EKG does show prolonged QTC.  Creatinine elevated consistent with patient with ESRD.  She has been compliant with dialysis, could possibly have been over diuresed causing hypokalemia.  Chest x-ray shows patchy and hazy opacities within the right lung base suspicious for acute infectious pneumonitis given history of cough and fever as well as underlying pulmonary vascular congestion with interstitial prominence.  She is high likelihood of Covid as her husband tested positive and was recently hospitalized for this.  While in the ED the patient became hypoxic to 88% on room air with good waveform and was placed on 2 L supplemental oxygen with improvement.  9:23 PM CONSULT: Spoke with Dr. Justin Mend with nephrology who recommends giving the patient 20 mEq of potassium p.o. No magnesium given normal magnesium levels on bloodwork today.   Spoke with Dr. Hal Hope with [facility] hospitalist service who agrees to assume  care of patient and bring her into the hospital for further evaluation and management.  Final Clinical Impressions(s) / ED Diagnoses   Final diagnoses:  Acute respiratory failure with hypoxia ([place])  Suspected COVID-19 virus infection  Hypokalemia    ED Discharge Orders    None       Debroah Baller 05/17/19 2352    Daleen Bo, MD 05/20/19 [phone]

## 2019-05-17 NOTE — ED Notes (Signed)
Pt given turkey sandwich and water

## 2019-05-18 ENCOUNTER — Encounter (HOSPITAL_COMMUNITY): Payer: Self-pay | Admitting: Internal Medicine

## 2019-05-18 ENCOUNTER — Other Ambulatory Visit: Payer: Self-pay

## 2019-05-18 DIAGNOSIS — Z992 Dependence on renal dialysis: Secondary | ICD-10-CM

## 2019-05-18 DIAGNOSIS — Z794 Long term (current) use of insulin: Secondary | ICD-10-CM

## 2019-05-18 DIAGNOSIS — E1121 Type 2 diabetes mellitus with diabetic nephropathy: Secondary | ICD-10-CM

## 2019-05-18 DIAGNOSIS — J1289 Other viral pneumonia: Secondary | ICD-10-CM

## 2019-05-18 DIAGNOSIS — N186 End stage renal disease: Secondary | ICD-10-CM

## 2019-05-18 DIAGNOSIS — U071 COVID-19: Principal | ICD-10-CM

## 2019-05-18 DIAGNOSIS — E876 Hypokalemia: Secondary | ICD-10-CM

## 2019-05-18 DIAGNOSIS — I10 Essential (primary) hypertension: Secondary | ICD-10-CM

## 2019-05-18 DIAGNOSIS — J189 Pneumonia, unspecified organism: Secondary | ICD-10-CM

## 2019-05-18 LAB — CBC WITH DIFFERENTIAL/PLATELET
Abs Immature Granulocytes: 0.05 10*3/uL (ref 0.00–0.07)
Basophils Absolute: 0 10*3/uL (ref 0.0–0.1)
Basophils Relative: 1 %
Eosinophils Absolute: 0 10*3/uL (ref 0.0–0.5)
Eosinophils Relative: 0 %
HCT: 32.3 % — ABNORMAL LOW (ref 36.0–46.0)
Hemoglobin: 10.4 g/dL — ABNORMAL LOW (ref 12.0–15.0)
Immature Granulocytes: 1 %
Lymphocytes Relative: 31 %
Lymphs Abs: 1.3 10*3/uL (ref 0.7–4.0)
MCH: 31 pg (ref 26.0–34.0)
MCHC: 32.2 g/dL (ref 30.0–36.0)
MCV: 96.1 fL (ref 80.0–100.0)
Monocytes Absolute: 0.4 10*3/uL (ref 0.1–1.0)
Monocytes Relative: 9 %
Neutro Abs: 2.4 10*3/uL (ref 1.7–7.7)
Neutrophils Relative %: 58 %
Platelets: 178 10*3/uL (ref 150–400)
RBC: 3.36 MIL/uL — ABNORMAL LOW (ref 3.87–5.11)
RDW: 13.4 % (ref 11.5–15.5)
WBC: 4.1 10*3/uL (ref 4.0–10.5)
nRBC: 0.5 % — ABNORMAL HIGH (ref 0.0–0.2)

## 2019-05-18 LAB — SARS CORONAVIRUS 2 (TAT 6-24 HRS): SARS Coronavirus 2: POSITIVE — AB

## 2019-05-18 LAB — C-REACTIVE PROTEIN: CRP: 9.7 mg/dL — ABNORMAL HIGH (ref <1.0)

## 2019-05-18 LAB — FERRITIN: Ferritin: 1787 ng/mL — ABNORMAL HIGH (ref 11–307)

## 2019-05-18 LAB — COMPREHENSIVE METABOLIC PANEL
ALT: 16 U/L (ref 0–44)
AST: 40 U/L (ref 15–41)
Albumin: 2.8 g/dL — ABNORMAL LOW (ref 3.5–5.0)
Alkaline Phosphatase: 129 U/L — ABNORMAL HIGH (ref 38–126)
Anion gap: 14 (ref 5–15)
BUN: 13 mg/dL (ref 8–23)
CO2: 29 mmol/L (ref 22–32)
Calcium: 8 mg/dL — ABNORMAL LOW (ref 8.9–10.3)
Chloride: 94 mmol/L — ABNORMAL LOW (ref 98–111)
Creatinine, Ser: 3.98 mg/dL — ABNORMAL HIGH (ref 0.44–1.00)
GFR calc Af Amer: 13 mL/min — ABNORMAL LOW (ref >60)
GFR calc non Af Amer: 11 mL/min — ABNORMAL LOW (ref >60)
Glucose, Bld: 142 mg/dL — ABNORMAL HIGH (ref 70–99)
Potassium: 2.8 mmol/L — ABNORMAL LOW (ref 3.5–5.1)
Sodium: 137 mmol/L (ref 135–145)
Total Bilirubin: 1.1 mg/dL (ref 0.3–1.2)
Total Protein: 6.6 g/dL (ref 6.5–8.1)

## 2019-05-18 LAB — GLUCOSE, CAPILLARY
Glucose-Capillary: 111 mg/dL — ABNORMAL HIGH (ref 70–99)
Glucose-Capillary: 113 mg/dL — ABNORMAL HIGH (ref 70–99)
Glucose-Capillary: 144 mg/dL — ABNORMAL HIGH (ref 70–99)

## 2019-05-18 LAB — HIV ANTIBODY (ROUTINE TESTING W REFLEX): HIV Screen 4th Generation wRfx: NONREACTIVE

## 2019-05-18 MED ORDER — ISOSORB DINITRATE-HYDRALAZINE 20-37.5 MG PO TABS
1.0000 | ORAL_TABLET | ORAL | Status: DC
  Administered 2019-05-18 – 2019-05-21 (×6): 1 via ORAL
  Filled 2019-05-18 (×5): qty 1

## 2019-05-18 MED ORDER — CINACALCET HCL 30 MG PO TABS: 90.0000 mg | ORAL_TABLET | Freq: Every day | ORAL | Status: DC

## 2019-05-18 MED ORDER — CARVEDILOL 12.5 MG PO TABS: 12.5000 mg | ORAL_TABLET | ORAL | Status: DC

## 2019-05-18 MED ORDER — LOPERAMIDE HCL 2 MG PO CAPS
2.0000 mg | ORAL_CAPSULE | ORAL | Status: DC | PRN
  Administered 2019-05-18 – 2019-05-20 (×2): 2 mg via ORAL
  Filled 2019-05-18 (×2): qty 1

## 2019-05-18 MED ORDER — PANTOPRAZOLE SODIUM 40 MG PO TBEC
40.0000 mg | DELAYED_RELEASE_TABLET | Freq: Every day | ORAL | Status: DC
  Administered 2019-05-18 – 2019-05-22 (×5): 40 mg via ORAL
  Filled 2019-05-18 (×5): qty 1

## 2019-05-18 MED ORDER — INSULIN ASPART 100 UNIT/ML ~~LOC~~ SOLN
0.0000 [IU] | Freq: Three times a day (TID) | SUBCUTANEOUS | Status: DC
  Administered 2019-05-18 – 2019-05-19 (×2): 1 [IU] via SUBCUTANEOUS
  Administered 2019-05-21: 3 [IU] via SUBCUTANEOUS
  Administered 2019-05-21 – 2019-05-22 (×2): 1 [IU] via SUBCUTANEOUS

## 2019-05-18 MED ORDER — ONDANSETRON HCL 4 MG/2ML IJ SOLN: 4.0000 mg | Freq: Four times a day (QID) | INTRAMUSCULAR | Status: DC | PRN

## 2019-05-18 MED ORDER — POTASSIUM CHLORIDE CRYS ER 20 MEQ PO TBCR
30.0000 meq | EXTENDED_RELEASE_TABLET | Freq: Once | ORAL | Status: AC
  Administered 2019-05-18: 30 meq via ORAL
  Filled 2019-05-18: qty 1

## 2019-05-18 MED ORDER — HEPARIN SODIUM (PORCINE) 5000 UNIT/ML IJ SOLN
5000.0000 [IU] | Freq: Three times a day (TID) | INTRAMUSCULAR | Status: DC
  Administered 2019-05-18 – 2019-05-22 (×12): 5000 [IU] via SUBCUTANEOUS
  Filled 2019-05-18 (×12): qty 1

## 2019-05-18 MED ORDER — SODIUM CHLORIDE 0.9 % IV SOLN
1.0000 g | Freq: Every day | INTRAVENOUS | Status: DC
  Filled 2019-05-18: qty 10

## 2019-05-18 MED ORDER — SENNA 8.6 MG PO TABS: 1.0000 | ORAL_TABLET | Freq: Every day | ORAL | Status: DC | PRN

## 2019-05-18 MED ORDER — CINACALCET HCL 30 MG PO TABS
90.0000 mg | ORAL_TABLET | ORAL | Status: DC
  Administered 2019-05-18: 90 mg via ORAL
  Filled 2019-05-18 (×2): qty 3

## 2019-05-18 MED ORDER — ASPIRIN EC 81 MG PO TBEC
81.0000 mg | DELAYED_RELEASE_TABLET | Freq: Every day | ORAL | Status: DC
  Administered 2019-05-18 – 2019-05-22 (×5): 81 mg via ORAL
  Filled 2019-05-18 (×5): qty 1

## 2019-05-18 MED ORDER — FERRIC CITRATE 1 GM 210 MG(FE) PO TABS
420.0000 mg | ORAL_TABLET | Freq: Three times a day (TID) | ORAL | Status: DC
  Administered 2019-05-18 – 2019-05-22 (×11): 420 mg via ORAL
  Filled 2019-05-18 (×17): qty 2

## 2019-05-18 MED ORDER — ACETAMINOPHEN 325 MG PO TABS
650.0000 mg | ORAL_TABLET | Freq: Four times a day (QID) | ORAL | Status: DC | PRN
  Administered 2019-05-18: 650 mg via ORAL
  Filled 2019-05-18: qty 2

## 2019-05-18 MED ORDER — FERRIC CITRATE 1 GM 210 MG(FE) PO TABS: 210.0000 mg | ORAL_TABLET | ORAL | Status: DC

## 2019-05-18 MED ORDER — ISOSORB DINITRATE-HYDRALAZINE 20-37.5 MG PO TABS
1.0000 | ORAL_TABLET | Freq: Every day | ORAL | Status: DC
  Administered 2019-05-18 – 2019-05-20 (×4): 1 via ORAL
  Filled 2019-05-18 (×6): qty 1

## 2019-05-18 MED ORDER — CARVEDILOL 12.5 MG PO TABS
12.5000 mg | ORAL_TABLET | ORAL | Status: DC
  Administered 2019-05-18 – 2019-05-20 (×2): 12.5 mg via ORAL
  Filled 2019-05-18 (×2): qty 1

## 2019-05-18 MED ORDER — ALBUTEROL SULFATE HFA 108 (90 BASE) MCG/ACT IN AERS
1.0000 | INHALATION_SPRAY | Freq: Four times a day (QID) | RESPIRATORY_TRACT | Status: DC | PRN
  Administered 2019-05-21: 2 via RESPIRATORY_TRACT
  Filled 2019-05-18: qty 6.7

## 2019-05-18 MED ORDER — SODIUM CHLORIDE 0.9 % IV SOLN
200.0000 mg | Freq: Once | INTRAVENOUS | Status: AC
  Administered 2019-05-18: 200 mg via INTRAVENOUS
  Filled 2019-05-18: qty 40

## 2019-05-18 MED ORDER — SODIUM CHLORIDE 0.9 % IV SOLN
500.0000 mg | Freq: Every day | INTRAVENOUS | Status: DC
  Filled 2019-05-18: qty 500

## 2019-05-18 MED ORDER — AMLODIPINE BESYLATE 10 MG PO TABS
10.0000 mg | ORAL_TABLET | Freq: Every day | ORAL | Status: DC
  Administered 2019-05-18 (×2): 10 mg via ORAL
  Filled 2019-05-18 (×2): qty 1

## 2019-05-18 MED ORDER — GUAIFENESIN 100 MG/5ML PO SOLN: 100.0000 mg | Freq: Two times a day (BID) | ORAL | Status: DC | PRN

## 2019-05-18 MED ORDER — RENA-VITE PO TABS
1.0000 | ORAL_TABLET | Freq: Every day | ORAL | Status: DC
  Administered 2019-05-18 – 2019-05-21 (×5): 1 via ORAL
  Filled 2019-05-18 (×6): qty 1

## 2019-05-18 MED ORDER — ISOSORB DINITRATE-HYDRALAZINE 20-37.5 MG PO TABS: 1.0000 | ORAL_TABLET | ORAL | Status: DC

## 2019-05-18 MED ORDER — CHLORHEXIDINE GLUCONATE CLOTH 2 % EX PADS
6.0000 | MEDICATED_PAD | Freq: Every day | CUTANEOUS | Status: DC
  Administered 2019-05-19 – 2019-05-22 (×4): 6 via TOPICAL

## 2019-05-18 MED ORDER — FERRIC CITRATE 1 GM 210 MG(FE) PO TABS: 210.0000 mg | ORAL_TABLET | ORAL | Status: DC | PRN

## 2019-05-18 MED ORDER — ACETAMINOPHEN 650 MG RE SUPP: 650.0000 mg | Freq: Four times a day (QID) | RECTAL | Status: DC | PRN

## 2019-05-18 MED ORDER — CINACALCET HCL 30 MG PO TABS
90.0000 mg | ORAL_TABLET | Freq: Every day | ORAL | Status: DC
  Administered 2019-05-18: 90 mg via ORAL
  Filled 2019-05-18 (×2): qty 3

## 2019-05-18 MED ORDER — ONDANSETRON HCL 4 MG PO TABS: 4.0000 mg | ORAL_TABLET | Freq: Four times a day (QID) | ORAL | Status: DC | PRN

## 2019-05-18 MED ORDER — CARVEDILOL 12.5 MG PO TABS
12.5000 mg | ORAL_TABLET | Freq: Every day | ORAL | Status: DC
  Administered 2019-05-18 – 2019-05-22 (×4): 12.5 mg via ORAL
  Filled 2019-05-18 (×6): qty 1

## 2019-05-18 MED ORDER — OXYCODONE-ACETAMINOPHEN 5-325 MG PO TABS: 1.0000 | ORAL_TABLET | Freq: Four times a day (QID) | ORAL | Status: DC | PRN

## 2019-05-18 MED ORDER — ATORVASTATIN CALCIUM 10 MG PO TABS
20.0000 mg | ORAL_TABLET | Freq: Every day | ORAL | Status: DC
  Administered 2019-05-18 – 2019-05-22 (×5): 20 mg via ORAL
  Filled 2019-05-18 (×4): qty 2

## 2019-05-18 MED ORDER — DIAZEPAM 5 MG PO TABS
5.0000 mg | ORAL_TABLET | Freq: Two times a day (BID) | ORAL | Status: DC | PRN
  Administered 2019-05-18 – 2019-05-19 (×2): 5 mg via ORAL
  Filled 2019-05-18 (×2): qty 1

## 2019-05-18 MED ORDER — SODIUM CHLORIDE 0.9 % IV SOLN
100.0000 mg | INTRAVENOUS | Status: DC
  Administered 2019-05-20 – 2019-05-22 (×3): 100 mg via INTRAVENOUS
  Filled 2019-05-18 (×5): qty 20

## 2019-05-18 MED ORDER — MOMETASONE FURO-FORMOTEROL FUM 200-5 MCG/ACT IN AERO
2.0000 | INHALATION_SPRAY | Freq: Two times a day (BID) | RESPIRATORY_TRACT | Status: DC
  Administered 2019-05-18 – 2019-05-22 (×8): 2 via RESPIRATORY_TRACT
  Filled 2019-05-18 (×2): qty 8.8

## 2019-05-18 MED ORDER — INSULIN NPH (HUMAN) (ISOPHANE) 100 UNIT/ML ~~LOC~~ SUSP
10.0000 [IU] | Freq: Every day | SUBCUTANEOUS | Status: DC
  Administered 2019-05-19 – 2019-05-20 (×2): 10 [IU] via SUBCUTANEOUS
  Filled 2019-05-18: qty 10

## 2019-05-18 MED ORDER — GABAPENTIN 100 MG PO CAPS
100.0000 mg | ORAL_CAPSULE | Freq: Every day | ORAL | Status: DC
  Administered 2019-05-18 – 2019-05-21 (×5): 100 mg via ORAL
  Filled 2019-05-18 (×5): qty 1

## 2019-05-18 MED ORDER — FLUTICASONE PROPIONATE 50 MCG/ACT NA SUSP: 1.0000 | Freq: Every day | NASAL | Status: DC | PRN

## 2019-05-18 NOTE — Consult Note (Signed)
   [facility] Inpatient Consult   05/18/2019  Destiny Day Feb 21, 1951 [id number]  Patient screened for high risk score for unplanned readmission score with a recent [employer] [employer] outreach with the [place] being unable to maintain contact.   Will follow patient for needs, progress and disposition.  Follow up with inpatient [facility] team, as possible.  Please place a [facility] Care Management consult as appropriate and for questions contact:   Natividad Brood, RN BSN [facility] Liaison  [phone] business mobile phone Toll free office [phone]  Fax number: [phone] [name]

## 2019-05-18 NOTE — Plan of Care (Signed)
  Problem: Clinical Measurements: Goal: Ability to maintain clinical measurements within normal limits will improve Outcome: Progressing Goal: Respiratory complications will improve Outcome: Progressing   Problem: Nutrition: Goal: Adequate nutrition will be maintained Outcome: Progressing   

## 2019-05-18 NOTE — ED Notes (Signed)
Attempted calling report x1. Asked if RN could call back.

## 2019-05-18 NOTE — Progress Notes (Signed)
Patient ID: Destiny Day, female   DOB: 07/21/1951, 68 y.o.   MRN: [id number] Patient was admitted early this morning for fever and chills and was found to have COVID-19 pneumonia with hypoxia.  Patient seen and examined at bedside and have discussed the plan of care with her.  I have consulted nephrology/Dr. Jonnie Finner.  Repeat a.m. labs including confirmatory markers.  Continue oxygen supplementation.

## 2019-05-18 NOTE — H&P (Signed)
History and Physical    Destiny Day KDX:[id number] DOB: [birth year] DOA: 05/17/2019  PCP: Velna Hatchet, MD  Patient coming from: Home.  Chief Complaint: Fever and chills.  HPI: Destiny Day is a 68 y.o. female with history of ESRD on hemodialysis on Monday Wednesday Friday, diabetes mellitus, anemia, hypertension who has been recently exposed to COVID-19 has been contracted has been having subjective feeling of fever and chills since yesterday morning and was found to be febrile at the dialysis center and was advised to come to the ER.  Patient has some nonproductive cough but otherwise denies any shortness of breath.  Denies any nausea vomiting except for one episode of diarrhea 3 days ago.  ED Course: In the ER patient was febrile with temperature of 101.7 F chest x-ray shows infiltrates concerning for pneumonitis and COVID-19 test positive.  Patient's CRP was 9.7 ferritin 1700 hemoglobin 10.4 platelet 178 potassium was 2.2.  Nephrology was consulted and he requested to go 20 mg of potassium.  EKG was showing normal sinus rhythm with prolonged QTC of 527 ms.  Patient has mild hypoxic 0.2 L oxygen to maintain sats and admitted for further management.  Review of Systems: As per HPI, rest all negative.   Past Medical History:  Diagnosis Date  . Abdominal bruit   . Anemia   . Anxiety   . Arthritis    Osteoarthritis  . Asthma   . Cervical disc disease    "pinced nerve"  . CHF (congestive heart failure) ([place])   . Complication of anesthesia    " after I got home from my last procedure, I started itching."  . Diabetes mellitus    Type II  . Diverticulitis   . ESRD (end stage renal disease) ([place])    dialysis - M/W/F- [place]  . GERD (gastroesophageal reflux disease)    from medications  . GI bleed 03/31/2013  . Head injury 07/2017  . History of hiatal hernia   . Hyperlipidemia   . Hypertension   . Neuropathy    left leg  . Osteoporosis   . Peripheral vascular  disease ([place])   . Pneumonia    "very young" and a few years ago  . PONV (postoperative nausea and vomiting)   . Seasonal allergies   . Shortness of breath dyspnea    WIth exertion, when fluid builds  . Sleep apnea    can't afford cpap     Past Surgical History:  Procedure Laterality Date  . A/V SHUNTOGRAM N/A 09/22/2016   Procedure: A/V Shuntogram - left arm;  Surgeon: Serafina Mitchell, MD;  Location: [place] [facility];  Service: Cardiovascular;  Laterality: N/A;  . A/V SHUNTOGRAM N/A 03/22/2018   Procedure: A/V SHUNTOGRAM - left arm;  Surgeon: Serafina Mitchell, MD;  Location: [place] [facility];  Service: Cardiovascular;  Laterality: N/A;  . A/V SHUNTOGRAM Left 11/17/2018   Procedure: A/V SHUNTOGRAM;  Surgeon: Angelia Mould, MD;  Location: [place] [facility];  Service: Cardiovascular;  Laterality: Left;  . ABDOMINAL HYSTERECTOMY  1993`  . AMPUTATION Left 09/01/2016   Procedure: LEFT FOOT TRANSMETATARSAL AMPUTATION;  Surgeon: Newt Minion, MD;  Location: [place];  Service: Orthopedics;  Laterality: Left;  . AMPUTATION Right 08/11/2017   Procedure: RIGHT GREAT TOE AMPUTATION DIGIT;  Surgeon: Rosetta Posner, MD;  Location: [place];  Service: Vascular;  Laterality: Right;  . AMPUTATION Right 12/31/2017   Procedure: RIGHT TRANSMETATARSAL AMPUTATION;  Surgeon: Newt Minion,  MD;  Location: [place];  Service: Orthopedics;  Laterality: Right;  . AV FISTULA PLACEMENT Left 04/21/2016   Procedure: INSERTION OF ARTERIOVENOUS (AV) GORE-TEX GRAFT ARM LEFT;  Surgeon: Elam Dutch, MD;  Location: [place];  Service: Vascular;  Laterality: Left;  . [place] TRANSPOSITION Left 07/10/2014   Procedure: BASCILIC VEIN TRANSPOSITION;  Surgeon: Angelia Mould, MD;  Location: [place];  Service: Vascular;  Laterality: Left;  . BASCILIC VEIN TRANSPOSITION Right 11/08/2014   Procedure: FIRST STAGE BASILIC VEIN TRANSPOSITION;  Surgeon: Angelia Mould, MD;  Location: [place];  Service: Vascular;   Laterality: Right;  . BASCILIC VEIN TRANSPOSITION Right 01/18/2015   Procedure: SECOND STAGE BASILIC VEIN TRANSPOSITION;  Surgeon: Angelia Mould, MD;  Location: [place];  Service: Vascular;  Laterality: Right;  . CRANIOTOMY N/A 08/23/2017   Procedure: CRANIOTOMY HEMATOMA EVACUATION SUBDURAL;  Surgeon: Ashok Pall, MD;  Location: [place];  Service: Neurosurgery;  Laterality: N/A;  . CRANIOTOMY Left 08/24/2017   Procedure: CRANIOTOMY FOR RECURRENT ACUTE SUBDURAL HEMATOMA;  Surgeon: Ashok Pall, MD;  Location: [place];  Service: Neurosurgery;  Laterality: Left;  . ESOPHAGOGASTRODUODENOSCOPY N/A 03/31/2013   Procedure: ESOPHAGOGASTRODUODENOSCOPY (EGD);  Surgeon: Gatha Mayer, MD;  Location: [facility] ENDOSCOPY;  Service: Endoscopy;  Laterality: N/A;  . EYE SURGERY     laser surgery  . FEMORAL-POPLITEAL BYPASS GRAFT Left 07/08/2016   Procedure: LEFT  FEMORAL-BELOW KNEE POPLITEAL ARTERY BYPASS GRAFT USING 6MM X 80 CM PROPATEN GORETEX GRAFT WITH RINGS.;  Surgeon: Rosetta Posner, MD;  Location: [place];  Service: Vascular;  Laterality: Left;  . FEMORAL-POPLITEAL BYPASS GRAFT Right 08/11/2017   Procedure: RIGHT FEMORAL TO BELOW KNEE POPLITEAL ARTERKY  BYPASS GRAFT USING 6MM RINGED PROPATEN GRAFT;  Surgeon: Rosetta Posner, MD;  Location: [place];  Service: Vascular;  Laterality: Right;  . FISTULOGRAM Left 10/29/2014   Procedure: FISTULOGRAM;  Surgeon: Angelia Mould, MD;  Location: [facility] CATH LAB;  Service: Cardiovascular;  Laterality: Left;  Marland Kitchen GAS/FLUID EXCHANGE Left 12/20/2018   Procedure: AIR GAS EXCHANGE;  Surgeon: Jalene Mullet, MD;  Location: [place];  Service: Ophthalmology;  Laterality: Left;  . KNEE ARTHROSCOPY Right 05/06/2018   Procedure: RIGHT KNEE ARTHROSCOPY;  Surgeon: Newt Minion, MD;  Location: [place];  Service: Orthopedics;  Laterality: Right;  . KNEE ARTHROSCOPY Right 06/10/2018   Procedure: RIGHT KNEE ARTHROSCOPY AND DEBRIDEMENT;  Surgeon: Newt Minion, MD;  Location: [place];  Service:  Orthopedics;  Laterality: Right;  . LIGATION OF ARTERIOVENOUS  FISTULA Left 04/21/2016   Procedure: LIGATION OF ARTERIOVENOUS  FISTULA LEFT ARM;  Surgeon: Elam Dutch, MD;  Location: [place];  Service: Vascular;  Laterality: Left;  . LOWER EXTREMITY ANGIOGRAPHY N/A 04/06/2017   Procedure: Lower Extremity Angiography - Right;  Surgeon: Serafina Mitchell, MD;  Location: [place] CV [facility];  Service: Cardiovascular;  Laterality: N/A;  . MEMBRANE PEEL Left 12/20/2018   Procedure: MEMBRANE PEEL;  Surgeon: Jalene Mullet, MD;  Location: [place];  Service: Ophthalmology;  Laterality: Left;  . PATCH ANGIOPLASTY Right 01/18/2015   Procedure: BASILIC VEIN PATCH ANGIOPLASTY USING VASCUGUARD PATCH;  Surgeon: Angelia Mould, MD;  Location: [place];  Service: Vascular;  Laterality: Right;  . PERIPHERAL VASCULAR BALLOON ANGIOPLASTY  09/22/2016   Procedure: Peripheral Vascular Balloon Angioplasty;  Surgeon: Serafina Mitchell, MD;  Location: [place] [facility];  Service: Cardiovascular;;  Lt. Fistula  . PERIPHERAL VASCULAR BALLOON ANGIOPLASTY Left 03/22/2018   Procedure: PERIPHERAL VASCULAR BALLOON ANGIOPLASTY;  Surgeon: Serafina Mitchell, MD;  Location: [place] [facility];  Service: Cardiovascular;  Laterality: Left;  Arm shunt  . PERIPHERAL VASCULAR CATHETERIZATION N/A 06/23/2016   Procedure: Abdominal Aortogram w/Lower Extremity;  Surgeon: Serafina Mitchell, MD;  Location: [place] [facility];  Service: Cardiovascular;  Laterality: N/A;  . PERIPHERAL VASCULAR CATHETERIZATION  06/23/2016   Procedure: Peripheral Vascular Intervention;  Surgeon: Serafina Mitchell, MD;  Location: [place] [facility];  Service: Cardiovascular;;  lt common and external illiac artery  . PHOTOCOAGULATION WITH LASER Left 12/20/2018   Procedure: PHOTOCOAGULATION WITH LASER;  Surgeon: Jalene Mullet, MD;  Location: [place];  Service: Ophthalmology;  Laterality: Left;  . 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 Her smoking use included cigarettes. She has a 14.00 pack-year smoking history. She has never used smokeless tobacco. She reports that she does not drink alcohol or use drugs.  Allergies  Allergen Reactions  . Prednisone Swelling and Other (See Comments)    Excessive fluid buildup  . Lisinopril Cough    Family History  Problem Relation Age of Onset  . Other Mother        not sure of cause of death  . Diabetes Father   . Pancreatic cancer Maternal Grandmother   . Colon cancer Neg Hx     Prior to Admission medications   Medication Sig Start Date End Date Taking? Authorizing Provider  albuterol (PROVENTIL HFA;VENTOLIN HFA) 108 (90 Base) MCG/ACT inhaler Inhale 1-2 puffs into the lungs every 6 (six) hours as needed for wheezing or shortness of breath. 01/23/18   Allie Bossier, MD  amLODipine (NORVASC) 10 MG tablet Take 10 mg by mouth at bedtime.    [provider]  aspirin EC 81 MG tablet Take 81 mg by mouth daily.    [provider]  atorvastatin (LIPITOR) 20 MG tablet TAKE 1 TABLET BY MOUTH EVERY DAY 04/11/19   Adrian Prows, MD  budesonide-formoterol [facility]) 160-4.5 MCG/ACT inhaler Inhale 1 puff into the lungs daily as needed (respiratory issues.).     [provider]  carvedilol (COREG) 12.5 MG tablet Take 12.5 mg by mouth See admin instructions. Take 1 tablet (12.5 mg) by mouth once daily in the evening on Mondays, Wednesdays, & Fridays. (Dialysis) Take 1 tablet (12.5 mg) by mouth twice daily on Sundays, Tuesdays, Thursdays, & Saturdays. (Non Dialysis)    [provider]  cinacalcet (SENSIPAR) 90 MG tablet Take 90 mg by mouth daily. Take 1 tablet (90 mg) by mouth in the evening on Mondays, Wednesdays, & Fridays (dialysis) Take 1 tablet (90 mg) by mouth twice  daily on Sundays, Tuesdays, Thursdays, Saturdays. (non dialysis) 11/28/18   [provider]  diazepam (VALIUM) 5 MG tablet Take 5 mg by mouth 2 (two) times daily as needed for anxiety.    [provider]  ferric citrate (AURYXIA) 1 GM 210 MG(Fe) tablet Take 210-420 mg by mouth See admin instructions. Take 2 tablets (420 mg) by mouth with each meal & take 1 tablet (210 mg) by mouth with snacks.    [provider]  fluticasone (FLONASE) 50 MCG/ACT nasal spray Place 1 spray into both nostrils daily as needed for allergies or rhinitis.    [provider]  gabapentin (NEURONTIN) 100 MG  capsule TAKE 1 CAPSULE (100 MG TOTAL) BY MOUTH AT BEDTIME. WHEN NECESSARY FOR NEUROPATHY PAIN 12/13/18   Newt Minion, MD  guaiFENesin (ROBITUSSIN) 100 MG/5ML liquid Take 100 mg by mouth every 12 (twelve) hours as needed for cough.    [provider]  hydrocerin (EUCERIN) CREA Apply 735 application topically 2 (two) times daily. Patient taking differently: Apply 1 application topically 2 (two) times daily.  09/16/17   Love, Ivan Anchors, PA-C  insulin NPH Human (HUMULIN N,NOVOLIN N) 100 UNIT/ML injection Inject 0.1 mLs (10 Units total) into the skin at bedtime. 01/23/18   Allie Bossier, MD  insulin regular (NOVOLIN R,HUMULIN R) 100 units/mL injection Inject 3-8 Units into the skin See admin instructions. 3 times daily with meals on Sundays, Tuesdays, Thursdays, & Saturdays per sliding scale    [provider]  isosorbide-hydrALAZINE (BIDIL) 20-37.5 MG tablet Take 1 tablet by mouth 3 (three) times daily. On NON dialysis days pt takes 1 tab 3 times daily, and on Dialysis days, pt takes 1 tab every evning Patient taking differently: Take 1 tablet by mouth See admin instructions. Take 1 tablet by mouth in the evening on Mondays, Wednesdays, & Fridays. (Dialysis) Take 1 tablet 3 times daily on Sundays, Tuesdays, Thursdays, & Saturdays. (Non-Dialysis) 12/06/18   Miquel Dunn,  NP  lidocaine-prilocaine (EMLA) cream Apply 1 application topically 3 (three) times a week. 1-2 hours prior to dialysis 01/07/18   [provider]  loratadine (CLARITIN) 10 MG tablet Take 10 mg by mouth daily as needed for allergies.    [provider]  multivitamin (RENA-VIT) TABS tablet Take 1 tablet by mouth daily.  01/24/15   [provider]  omeprazole (PRILOSEC) 20 MG capsule Take 20 mg by mouth daily.     [provider]  oxyCODONE-acetaminophen (PERCOCET/ROXICET) 5-325 MG tablet Take 1 tablet by mouth every 6 (six) hours as needed for severe pain. 02/02/19   Newt Minion, MD  senna (SENOKOT) 8.6 MG tablet Take 1 tablet by mouth as needed for constipation.    [provider]  silver sulfADIAZINE (SILVADENE) 1 % cream Apply 1 application topically daily. 02/16/19   Chase Picket, MD  Vitamin D, Ergocalciferol, (DRISDOL) 1.25 MG (50000 UT) CAPS capsule Take 50,000 Units by mouth every Sunday. Takes on Sunday     [provider]    Physical Exam: Constitutional: Moderately built and nourished. Vitals:   05/17/19 2207 05/17/19 2315 05/18/19 0040 05/18/19 0100  BP: (!) 132/49   (!) 179/75  Pulse:  66    Resp: (!) 21 (!) 23  20  Temp:   97.7 F (36.5 C)   TempSrc:   Oral   SpO2: 98% 98%     Eyes: Anicteric no pallor. ENMT: No discharge from the ears eyes nose and mouth. Neck: No mass felt.  No neck rigidity.  No JVD appreciated. Respiratory: No rhonchi or crepitations. Cardiovascular: S1-S2 heard. Abdomen: Soft nontender bowel sounds present. Musculoskeletal: No edema present Skin: No rash. Neurologic: Alert awake oriented to time place and person.  Moves all extremities. Psychiatric: Appears normal.   Labs on Admission: I have personally reviewed following labs and imaging studies  CBC: Recent Labs  Lab 05/17/19 1936  WBC 4.8  NEUTROABS 3.7  HGB 10.3*  HCT 32.1*  MCV 96.7  PLT 329   Basic Metabolic Panel: Recent  Labs  Lab 05/17/19 1936 05/17/19 2043  NA 137  --   K 2.2*  --  CL 94*  --   CO2 30  --   GLUCOSE 189*  --   BUN 9  --   CREATININE 3.31*  --   CALCIUM 8.0*  --   MG  --  1.8   GFR: CrCl cannot be calculated (Unknown ideal weight.). Liver Function Tests: Recent Labs  Lab 05/17/19 1936  AST 35  ALT 17  ALKPHOS 138*  BILITOT 0.9  PROT 6.8  ALBUMIN 3.0*   No results for input(s): LIPASE, AMYLASE in the last 168 hours. No results for input(s): AMMONIA in the last 168 hours. Coagulation Profile: Recent Labs  Lab 05/17/19 1936  INR 1.1   Cardiac Enzymes: No results for input(s): CKTOTAL, CKMB, CKMBINDEX, TROPONINI in the last 168 hours. BNP (last 3 results) No results for input(s): PROBNP in the last 8760 hours. HbA1C: No results for input(s): HGBA1C in the last 72 hours. CBG: No results for input(s): GLUCAP in the last 168 hours. Lipid Profile: No results for input(s): CHOL, HDL, LDLCALC, TRIG, CHOLHDL, LDLDIRECT in the last 72 hours. Thyroid Function Tests: No results for input(s): TSH, T4TOTAL, FREET4, T3FREE, THYROIDAB in the last 72 hours. Anemia Panel: No results for input(s): VITAMINB12, FOLATE, FERRITIN, TIBC, IRON, RETICCTPCT in the last 72 hours. Urine analysis:    Component Value Date/Time   COLORURINE YELLOW 07/07/2016 0914   APPEARANCEUR CLOUDY (A) 07/07/2016 0914   LABSPEC >1.030 (H) 07/07/2016 0914   PHURINE 5.5 07/07/2016 0914   GLUCOSEU NEGATIVE 07/07/2016 0914   HGBUR SMALL (A) 07/07/2016 0914   BILIRUBINUR SMALL (A) 07/07/2016 0914   KETONESUR 15 (A) 07/07/2016 0914   PROTEINUR >300 (A) 07/07/2016 0914   UROBILINOGEN 0.2 07/30/2014 2000   NITRITE NEGATIVE 07/07/2016 0914   LEUKOCYTESUR SMALL (A) 07/07/2016 0914   Sepsis Labs: @LABRCNTIP (procalcitonin:4,lacticidven:4) ) Recent Results (from the past 240 hour(s))  SARS CORONAVIRUS 2 (TAT 6-24 HRS) Nasopharyngeal Nasopharyngeal Swab     Status: Abnormal   Collection Time: 05/17/19  7:40  PM   Specimen: Nasopharyngeal Swab  Result Value Ref Range Status   SARS Coronavirus 2 POSITIVE (A) NEGATIVE Final    Comment: RESULT CALLED TO, READ BACK BY AND VERIFIED WITH: RIEARY A, RN AT 0040 ON 05/18/2019 BY SAINVILUS S (NOTE) SARS-CoV-2 target nucleic acids are DETECTED. The SARS-CoV-2 RNA is generally detectable in upper and lower respiratory specimens during the acute phase of infection. Positive results are indicative of active infection with SARS-CoV-2. Clinical  correlation with patient history and other diagnostic information is necessary to determine patient infection status. Positive results do  not rule out bacterial infection or co-infection with other viruses. The expected result is Negative. Fact Sheet for Patients: SugarRoll.be Fact Sheet for Healthcare Providers: https://www.woods-mathews.com/ This test is not yet approved or cleared by the [place] FDA and  has been authorized for detection and/or diagnosis of SARS-CoV-2 by FDA under an Emergency Use Authorization (EUA). This EUA will remain  in effect (meaning this test can  be used) for the duration of the COVID-19 declaration under Section 564(b)(1) of the Act, 21 U.S.C. section 360bbb-3(b)(1), unless the authorization is terminated or revoked sooner. Performed at [facility], [place] [address], [place], [place] [address]      Radiological Exams on Admission: Dg Chest Port 1 View  Result Date: 05/17/2019 CLINICAL DATA:  Initial evaluation for acute cough, fever. EXAM: PORTABLE CHEST 1 VIEW COMPARISON:  Prior radiograph from 06/18/2018. FINDINGS: Mild cardiomegaly, stable. Mediastinal silhouette within normal limits. Aortic atherosclerosis. Lungs normally  inflated. Patchy and hazy opacity within the right lung base, suspicious for possible infectious pneumonitis given history of cough and fever. Underlying diffuse pulmonary vascular congestion with  interstitial prominence, suggesting a degree of pulmonary interstitial edema. No obvious pleural effusion. No pneumothorax. No acute osseous finding. Osteopenia noted. IMPRESSION: 1. Patchy and hazy opacity within the right lung base, suspicious for acute infectious pneumonitis given history of cough and fever. 2. Underlying diffuse pulmonary vascular congestion with interstitial prominence, suggesting a degree of pulmonary interstitial edema. 3.  Aortic Atherosclerosis (ICD10-I70.0). Electronically Signed   By: Jeannine Boga M.D.   On: 05/17/2019 19:26    EKG: Independently reviewed.  Normal sinus rhythm with prolonged QTC.  Assessment/Plan Principal Problem:   CAP (community acquired pneumonia) Active Problems:   Type 2 diabetes mellitus with diabetic nephropathy (HCC)   PAD (peripheral artery disease) ([place])   ESRD on dialysis ([place])   Benign essential HTN   Essential hypertension    1. Acute hypoxic respiratory failure secondary to Covid pneumonia -patient is requiring 2 L oxygen with chest x-ray showing infiltrates will consult pharmacy for remdesivir.  Patient has history of angioedema form prednisone so holding off Decadron.  Closely observe in telemetry. 2. Diabetes mellitus type 2 on NPH insulin at bedtime.  Sliding scale coverage. 3. ESRD on hemodialysis with hyperkalemia on Monday Wednesday Friday.  Consult nephrology for dialysis.  Patient was given 20 mg of potassium in the ER. 4. Hypertension on Coreg BiDil and Norvasc. 5. Hyperlipidemia on statins. 6. Anemia likely from ESRD follow CBC.  Given that patient has acute hypoxic respiratory failure from COVID-19 will need more than 2 midnight stay and inpatient status.   DVT prophylaxis: Heparin. Code Status: Full code. Family Communication: Discussed with patient. Disposition Plan: Home. Consults called: ER physician discussed with nephrologist. Admission status: Inpatient.   Rise Patience MD [facility] Pager [phone].  If 7PM-7AM, please contact night-coverage [email or web address] Password [email or web address]  05/18/2019, 1:45 AM

## 2019-05-18 NOTE — ED Notes (Signed)
Pt aware we need urine sample. Pt does not produce much urine anymore.

## 2019-05-18 NOTE — Consult Note (Signed)
Renal Service Consult Note [facility]  Destiny Day 05/18/2019 Sol Blazing Requesting Physician:  Dr Starla Link, Raliegh Ip.   Reason for Consult:  ESRD pt w/ COVID PNA HPI: The patient is a 68 y.o. year-old with hx of DM2 on insulin, HTN, ESRD on HD. Pt admitted yest for fevers x 1-2 days.  EMS temp was 102 deg F. COVID+. On 2L [place]. Asked to see for ESRD.   CXR showed patchy infiltrates R base, also mild IS edema pattern.  Pt on 2L [place], no SOB, no sig cough or CP.  Does not miss HD usually.  Had full HD yesterday am.      ROS  denies CP  no joint pain   no HA  no blurry vision  no rash  no diarrhea  no nausea/ vomiting   Past Medical History  Past Medical History:  Diagnosis Date  . Abdominal bruit   . Anemia   . Anxiety   . Arthritis    Osteoarthritis  . Asthma   . Cervical disc disease    "pinced nerve"  . CHF (congestive heart failure) ([place])   . Complication of anesthesia    " after I got home from my last procedure, I started itching."  . Diabetes mellitus    Type II  . Diverticulitis   . ESRD (end stage renal disease) ([place])    dialysis - M/W/F- [place]  . GERD (gastroesophageal reflux disease)    from medications  . GI bleed 03/31/2013  . Head injury 07/2017  . History of hiatal hernia   . Hyperlipidemia   . Hypertension   . Neuropathy    left leg  . Osteoporosis   . Peripheral vascular disease ([place])   . Pneumonia    "very young" and a few years ago  . PONV (postoperative nausea and vomiting)   . Seasonal allergies   . Shortness of breath dyspnea    WIth exertion, when fluid builds  . Sleep apnea    can't afford cpap    Past Surgical History  Past Surgical History:  Procedure Laterality Date  . A/V SHUNTOGRAM N/A 09/22/2016   Procedure: A/V Shuntogram - left arm;  Surgeon: Serafina Mitchell, MD;  Location: [place] [facility];  Service: Cardiovascular;  Laterality: N/A;  . A/V SHUNTOGRAM N/A 03/22/2018   Procedure: A/V SHUNTOGRAM - left  arm;  Surgeon: Serafina Mitchell, MD;  Location: [place] [facility];  Service: Cardiovascular;  Laterality: N/A;  . A/V SHUNTOGRAM Left 11/17/2018   Procedure: A/V SHUNTOGRAM;  Surgeon: Angelia Mould, MD;  Location: [place] [facility];  Service: Cardiovascular;  Laterality: Left;  . ABDOMINAL HYSTERECTOMY  1993`  . AMPUTATION Left 09/01/2016   Procedure: LEFT FOOT TRANSMETATARSAL AMPUTATION;  Surgeon: Newt Minion, MD;  Location: [place];  Service: Orthopedics;  Laterality: Left;  . AMPUTATION Right 08/11/2017   Procedure: RIGHT GREAT TOE AMPUTATION DIGIT;  Surgeon: Rosetta Posner, MD;  Location: [place];  Service: Vascular;  Laterality: Right;  . AMPUTATION Right 12/31/2017   Procedure: RIGHT TRANSMETATARSAL AMPUTATION;  Surgeon: Newt Minion, MD;  Location: [place];  Service: Orthopedics;  Laterality: Right;  . AV FISTULA PLACEMENT Left 04/21/2016   Procedure: INSERTION OF ARTERIOVENOUS (AV) GORE-TEX GRAFT ARM LEFT;  Surgeon: Elam Dutch, MD;  Location: [place];  Service: Vascular;  Laterality: Left;  . [place] TRANSPOSITION Left 07/10/2014   Procedure: BASCILIC VEIN TRANSPOSITION;  Surgeon: Angelia Mould, MD;  Location: [facility];  Service: Vascular;  Laterality: Left;  . BASCILIC VEIN TRANSPOSITION Right 11/08/2014   Procedure: FIRST STAGE BASILIC VEIN TRANSPOSITION;  Surgeon: Angelia Mould, MD;  Location: [place];  Service: Vascular;  Laterality: Right;  . BASCILIC VEIN TRANSPOSITION Right 01/18/2015   Procedure: SECOND STAGE BASILIC VEIN TRANSPOSITION;  Surgeon: Angelia Mould, MD;  Location: [place];  Service: Vascular;  Laterality: Right;  . CRANIOTOMY N/A 08/23/2017   Procedure: CRANIOTOMY HEMATOMA EVACUATION SUBDURAL;  Surgeon: Ashok Pall, MD;  Location: [place];  Service: Neurosurgery;  Laterality: N/A;  . CRANIOTOMY Left 08/24/2017   Procedure: CRANIOTOMY FOR RECURRENT ACUTE SUBDURAL HEMATOMA;  Surgeon: Ashok Pall, MD;  Location: [place];  Service: Neurosurgery;   Laterality: Left;  . ESOPHAGOGASTRODUODENOSCOPY N/A 03/31/2013   Procedure: ESOPHAGOGASTRODUODENOSCOPY (EGD);  Surgeon: Gatha Mayer, MD;  Location: [facility]OSCOPY;  Service: Endoscopy;  Laterality: N/A;  . EYE SURGERY     laser surgery  . FEMORAL-POPLITEAL BYPASS GRAFT Left 07/08/2016   Procedure: LEFT  FEMORAL-BELOW KNEE POPLITEAL ARTERY BYPASS GRAFT USING 6MM X 80 CM PROPATEN GORETEX GRAFT WITH RINGS.;  Surgeon: Rosetta Posner, MD;  Location: [place];  Service: Vascular;  Laterality: Left;  . FEMORAL-POPLITEAL BYPASS GRAFT Right 08/11/2017   Procedure: RIGHT FEMORAL TO BELOW KNEE POPLITEAL ARTERKY  BYPASS GRAFT USING 6MM RINGED PROPATEN GRAFT;  Surgeon: Rosetta Posner, MD;  Location: [place];  Service: Vascular;  Laterality: Right;  . FISTULOGRAM Left 10/29/2014   Procedure: FISTULOGRAM;  Surgeon: Angelia Mould, MD;  Location: [facility];  Service: Cardiovascular;  Laterality: Left;  Marland Kitchen GAS/FLUID EXCHANGE Left 12/20/2018   Procedure: AIR GAS EXCHANGE;  Surgeon: Jalene Mullet, MD;  Location: [place];  Service: Ophthalmology;  Laterality: Left;  . KNEE ARTHROSCOPY Right 05/06/2018   Procedure: RIGHT KNEE ARTHROSCOPY;  Surgeon: Newt Minion, MD;  Location: [place];  Service: Orthopedics;  Laterality: Right;  . KNEE ARTHROSCOPY Right 06/10/2018   Procedure: RIGHT KNEE ARTHROSCOPY AND DEBRIDEMENT;  Surgeon: Newt Minion, MD;  Location: [place];  Service: Orthopedics;  Laterality: Right;  . LIGATION OF ARTERIOVENOUS  FISTULA Left 04/21/2016   Procedure: LIGATION OF ARTERIOVENOUS  FISTULA LEFT ARM;  Surgeon: Elam Dutch, MD;  Location: [place];  Service: Vascular;  Laterality: Left;  . LOWER EXTREMITY ANGIOGRAPHY N/A 04/06/2017   Procedure: Lower Extremity Angiography - Right;  Surgeon: Serafina Mitchell, MD;  Location: [place] [facility];  Service: Cardiovascular;  Laterality: N/A;  . MEMBRANE PEEL Left 12/20/2018   Procedure: MEMBRANE PEEL;  Surgeon: Jalene Mullet, MD;  Location: [place];  Service:  Ophthalmology;  Laterality: Left;  . PATCH ANGIOPLASTY Right 01/18/2015   Procedure: BASILIC VEIN PATCH ANGIOPLASTY USING VASCUGUARD PATCH;  Surgeon: Angelia Mould, MD;  Location: [place];  Service: Vascular;  Laterality: Right;  . PERIPHERAL VASCULAR BALLOON ANGIOPLASTY  09/22/2016   Procedure: Peripheral Vascular Balloon Angioplasty;  Surgeon: Serafina Mitchell, MD;  Location: [place] [facility];  Service: Cardiovascular;;  Lt. Fistula  . PERIPHERAL VASCULAR BALLOON ANGIOPLASTY Left 03/22/2018   Procedure: PERIPHERAL VASCULAR BALLOON ANGIOPLASTY;  Surgeon: Serafina Mitchell, MD;  Location: [place] [facility];  Service: Cardiovascular;  Laterality: Left;  Arm shunt  . PERIPHERAL VASCULAR CATHETERIZATION N/A 06/23/2016   Procedure: Abdominal Aortogram w/Lower Extremity;  Surgeon: Serafina Mitchell, MD;  Location: [place] [facility];  Service: Cardiovascular;  Laterality: N/A;  . PERIPHERAL VASCULAR CATHETERIZATION  06/23/2016   Procedure: Peripheral Vascular  Intervention;  Surgeon: Serafina Mitchell, MD;  Location: [place] [facility];  Service: Cardiovascular;;  lt common and external illiac artery  . PHOTOCOAGULATION WITH LASER Left 12/20/2018   Procedure: PHOTOCOAGULATION WITH LASER;  Surgeon: Jalene Mullet, MD;  Location: [place];  Service: Ophthalmology;  Laterality: Left;  . 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 Other Mother        not sure of cause of death  . Diabetes Father   . Pancreatic cancer Maternal Grandmother   . Colon cancer Neg Hx    Social History  reports that she quit smoking about 7 years ago. Her smoking use included cigarettes. She has a 14.00 pack-year smoking history. She has never used smokeless tobacco. She reports that she does  not drink alcohol or use drugs. Allergies  Allergies  Allergen Reactions  . Adhesive  [Tape] Hives  . Prednisone Swelling and Other (See Comments)    Excessive fluid buildup  . Lisinopril Cough   Home medications Prior to Admission medications   Medication Sig Start Date End Date Taking? Authorizing Provider  albuterol (PROVENTIL HFA;VENTOLIN HFA) 108 (90 Base) MCG/ACT inhaler Inhale 1-2 puffs into the lungs every 6 (six) hours as needed for wheezing or shortness of breath. 01/23/18  Yes Allie Bossier, MD  amLODipine (NORVASC) 10 MG tablet Take 10 mg by mouth at bedtime.   Yes [provider]  aspirin (BAYER ASPIRIN EC LOW DOSE) 81 MG EC tablet Take 81 mg by mouth daily. 04/08/18  Yes [provider]  aspirin EC 81 MG tablet Take 81 mg by mouth daily.   Yes [provider]  atorvastatin (LIPITOR) 20 MG tablet TAKE 1 TABLET BY MOUTH EVERY DAY Patient taking differently: Take 20 mg by mouth daily at 6 PM.  04/11/19  Yes Adrian Prows, MD  budesonide-formoterol (SYMBICORT) 160-4.5 MCG/ACT inhaler Inhale 1 puff into the lungs daily as needed (respiratory issues.).    Yes [provider]  carvedilol (COREG) 12.5 MG tablet Take 12.5 mg by mouth See admin instructions. Take 1 tablet (12.5 mg) by mouth once daily in the evening on Mondays, Wednesdays, & Fridays. (Dialysis) Take 1 tablet (12.5 mg) by mouth twice daily on Sundays, Tuesdays, Thursdays, & Saturdays. (Non Dialysis)   Yes [provider]  cinacalcet (SENSIPAR) 60 MG tablet Take 60 mg by mouth daily. Take 1 tablet (90 mg) by mouth in the evening on Mondays, Wednesdays, & Fridays (dialysis) Take 1 tablet (90 mg) by mouth twice daily on Sundays, Tuesdays, Thursdays, Saturdays. (non dialysis) 11/28/18  Yes [provider]  diazepam (VALIUM) 5 MG tablet Take 5 mg by mouth 2 (two) times daily as needed for anxiety.   Yes [provider]  ferric citrate (AURYXIA) 1 GM 210 MG(Fe) tablet Take  210-420 mg by mouth See admin instructions. Take 2 tablets (420 mg) by mouth with each meal & take 1 tablet (210 mg) by mouth with snacks.   Yes [provider]  fluticasone (FLONASE) 50 MCG/ACT nasal spray Place 1 spray into both nostrils daily as needed for allergies or rhinitis.   Yes [provider]  gabapentin (NEURONTIN) 100 MG capsule TAKE 1 CAPSULE (100 MG TOTAL) BY MOUTH AT BEDTIME. WHEN NECESSARY FOR NEUROPATHY PAIN 12/13/18  Yes Newt Minion, MD  hydrocerin (EUCERIN) CREA Apply 169 application topically 2 (two) times daily. Patient taking differently: Apply 1 application topically 2 (two) times daily.  09/16/17  Yes Love, Ivan Anchors, PA-C  insulin NPH Human (HUMULIN N,NOVOLIN N) 100 UNIT/ML injection Inject 0.1 mLs (10 Units total) into the skin at bedtime. 01/23/18  Yes Allie Bossier, MD  insulin regular (NOVOLIN R,HUMULIN R) 100 units/mL injection Inject 3-8 Units into the skin See admin instructions. 3 times daily with meals per sliding scale except on Dialysis days, Mon, Wed, Fri   Yes [provider]  isosorbide-hydrALAZINE (BIDIL) 20-37.5 MG tablet Take 1 tablet by mouth 3 (three) times daily. On NON dialysis days pt takes 1 tab 3 times daily, and on Dialysis days, pt takes 1 tab every evning Patient taking differently: Take 1 tablet by mouth See admin instructions. Take 1 tablet by mouth in the evening on Mondays, Wednesdays, & Fridays. (Dialysis) Take 1 tablet 3 times daily on Sundays, Tuesdays, Thursdays, & Saturdays. (Non-Dialysis) 12/06/18  Yes Miquel Dunn, NP  lidocaine-prilocaine (EMLA) cream Apply 1 application topically 3 (three) times a week. 1-2 hours prior to dialysis 01/07/18  Yes [provider]  loratadine (CLARITIN) 10 MG tablet Take 10 mg by mouth daily as needed for allergies.   Yes [provider]  multivitamin (RENA-VIT) TABS tablet Take 1 tablet by mouth daily.  01/24/15  Yes [provider]  omeprazole  (PRILOSEC) 20 MG capsule Take 20 mg by mouth daily.    Yes [provider]  senna (SENOKOT) 8.6 MG tablet Take 1 tablet by mouth as needed for constipation.   Yes [provider]  silver sulfADIAZINE (SILVADENE) 1 % cream Apply 1 application topically daily. 02/16/19  Yes Lamptey, Myrene Galas, MD  Vitamin D, Ergocalciferol, (DRISDOL) 1.25 MG (50000 UT) CAPS capsule Take 50,000 Units by mouth every Sunday. Takes on Sunday    Yes [provider]  guaiFENesin (ROBITUSSIN) 100 MG/5ML liquid Take 100 mg by mouth every 12 (twelve) hours as needed for cough.    [provider]  oxyCODONE-acetaminophen (PERCOCET/ROXICET) 5-325 MG tablet Take 1 tablet by mouth every 6 (six) hours as needed for severe pain. Patient not taking: Reported on 05/18/2019 02/02/19   Newt Minion, MD   Liver Function Tests Recent Labs  Lab 05/17/19 1936 05/18/19 0348  AST 35 40  ALT 17 16  ALKPHOS 138* 129*  BILITOT 0.9 1.1  PROT 6.8 6.6  ALBUMIN 3.0* 2.8*   No results for input(s): LIPASE, AMYLASE in the last 168 hours. CBC Recent Labs  Lab 05/17/19 1936 05/18/19 0348  WBC 4.8 4.1  NEUTROABS 3.7 2.4  HGB 10.3* 10.4*  HCT 32.1* 32.3*  MCV 96.7 96.1  PLT 175 678   Basic Metabolic Panel Recent Labs  Lab 05/17/19 1936 05/18/19 0348  NA 137 137  K 2.2* 2.8*  CL 94* 94*  CO2 30 29  GLUCOSE 189* 142*  BUN 9 13  CREATININE 3.31* 3.98*  CALCIUM 8.0* 8.0*   Iron/TIBC/Ferritin/ %Sat    Component Value Date/Time   IRON 31 08/28/2017 0414   TIBC 140 (L) 08/28/2017 0414   FERRITIN 1,787 (H) 05/18/2019 0440   IRONPCTSAT 22 08/28/2017 0414    Vitals:   05/18/19 0303 05/18/19 0551 05/18/19 0757 05/18/19 1646  BP: (!) 156/72  130/60 (!) 125/53  Pulse: 75  61 74  Resp:   16 16  Temp: 97.8 F (36.6 C)  98.4  F (36.9 C) 98.3 F (36.8 C)  TempSrc: Oral  Oral Oral  SpO2: 97%  99% 99%  Weight:  69.6 kg    Height:  5\' 5"  (1.651 m)      Exam Gen alert, 2L [place], no  distress No rash, cyanosis or gangrene Sclera anicteric, throat clear  No jvd or bruits Chest slight rales R base, L clear RRR 2/6 sem, no RG Abd soft ntnd no mass or ascites +bs GU defer MS no joint effusions or deformity Ext no LE or UE edema, no wounds or ulcers Neuro is alert, Ox 3 , nf LUA AVG +t/b    Home meds:  - amlodipine 10/ carvedilol / isosorbide-hydralazine  - insulin NPH 10hs/ regular 3- 8u SSI  - cinacalcet qd/ ferric citrate ac  - aspirin 81/ atrovastatin 20  - gabapentin 100hs/ diazepam 5 bid prn/ oxy-acetaminophen qid prn  - omeprazole 40/ symbicort qd prn   - prn's/ vitamins/ supplements    Outpt HD: [place] [facility]    4h  67kg  2/2.25 bath  AVG   Hep 2500 w/ 1022midrun  - hect 8ug  - mircera 50ug q4wk, last 10/14  - venofer 100 x 5, one left      Assessment/ Plan: 1. COVID+ PNA 2. ESRD - on HD MWF. Had OP HD yest am. Plan HD tomorrow 3. HTN/ Vol - BP's normal here, up 2-3 by wts. UF to dry wt w/ HD tomorrow. 4. DM on insulin 5. Anemia ckd - last esa on 10/14, not due for 6 days      Kelly Splinter  MD 05/18/2019, 5:02 PM

## 2019-05-18 NOTE — ED Notes (Signed)
ED TO INPATIENT HANDOFF REPORT  ED Nurse Name and Phone #: [phone]  S Name/Age/Gender Destiny Day 68 y.o. female Room/Bed: 040C/040C  Code Status   Code Status: Prior  Home/SNF/Other Home Patient oriented to: self, place, time and situation Is this baseline? Yes   Triage Complete: Triage complete  Chief Complaint Possible Covid-19 positive  Triage Note Pt arrived via EMS from Home . Pt reports she went to Dialysis today and was told she had a fever of 99 . Pt was advised to go to [facility] for COVID -19 test. Pt called 911 from home for transport to ED. EMS reported Pt had a temp of 102 ,O2 sats 96 % 2 liter of nasal  . Pt reports feeling sick for 2 weeks and a cough for 2 weeks.   Allergies Allergies  Allergen Reactions  . Prednisone Swelling and Other (See Comments)    Excessive fluid buildup  . Lisinopril Cough    Level of Care/Admitting Diagnosis ED Disposition    ED Disposition Condition [facility]: [place] [[id number]]  Level of Care: Telemetry Medical [104]  Covid Evaluation: Asymptomatic Screening Protocol (No Symptoms)  Diagnosis: CAP (community acquired pneumonia) [213086]  Admitting Physician: Rise Patience [phone]  Attending Physician: Rise Patience [phone]  Estimated length of stay: past midnight tomorrow  Certification:: I certify this patient will need inpatient services for at least 2 midnights  PT Class (Do Not Modify): Inpatient [101]  PT Acc Code (Do Not Modify): Private [1]       B Medical/Surgery History Past Medical History:  Diagnosis Date  . Abdominal bruit   . Anemia   . Anxiety   . Arthritis    Osteoarthritis  . Asthma   . Cervical disc disease    "pinced nerve"  . CHF (congestive heart failure) ([place])   . Complication of anesthesia    " after I got home from my last procedure, I started itching."  . Diabetes mellitus    Type II  . Diverticulitis   . ESRD (end stage renal  disease) ([place])    dialysis - M/W/F- [place]  . GERD (gastroesophageal reflux disease)    from medications  . GI bleed 03/31/2013  . Head injury 07/2017  . History of hiatal hernia   . Hyperlipidemia   . Hypertension   . Neuropathy    left leg  . Osteoporosis   . Peripheral vascular disease ([place])   . Pneumonia    "very young" and a few years ago  . PONV (postoperative nausea and vomiting)   . Seasonal allergies   . Shortness of breath dyspnea    WIth exertion, when fluid builds  . Sleep apnea    can't afford cpap    Past Surgical History:  Procedure Laterality Date  . A/V SHUNTOGRAM N/A 09/22/2016   Procedure: A/V Shuntogram - left arm;  Surgeon: Serafina Mitchell, MD;  Location: [place] [facility];  Service: Cardiovascular;  Laterality: N/A;  . A/V SHUNTOGRAM N/A 03/22/2018   Procedure: A/V SHUNTOGRAM - left arm;  Surgeon: Serafina Mitchell, MD;  Location: [place] [facility];  Service: Cardiovascular;  Laterality: N/A;  . A/V SHUNTOGRAM Left 11/17/2018   Procedure: A/V SHUNTOGRAM;  Surgeon: Angelia Mould, MD;  Location: [place] [facility];  Service: Cardiovascular;  Laterality: Left;  . ABDOMINAL HYSTERECTOMY  1993`  . AMPUTATION Left 09/01/2016   Procedure: LEFT FOOT TRANSMETATARSAL AMPUTATION;  Surgeon: Beverely Low  Fernanda Drum, MD;  Location: [place];  Service: Orthopedics;  Laterality: Left;  . AMPUTATION Right 08/11/2017   Procedure: RIGHT GREAT TOE AMPUTATION DIGIT;  Surgeon: Rosetta Posner, MD;  Location: [place];  Service: Vascular;  Laterality: Right;  . AMPUTATION Right 12/31/2017   Procedure: RIGHT TRANSMETATARSAL AMPUTATION;  Surgeon: Newt Minion, MD;  Location: [place];  Service: Orthopedics;  Laterality: Right;  . AV FISTULA PLACEMENT Left 04/21/2016   Procedure: INSERTION OF ARTERIOVENOUS (AV) GORE-TEX GRAFT ARM LEFT;  Surgeon: Elam Dutch, MD;  Location: [place];  Service: Vascular;  Laterality: Left;  . [place] TRANSPOSITION Left 07/10/2014   Procedure: BASCILIC VEIN  TRANSPOSITION;  Surgeon: Angelia Mould, MD;  Location: [place];  Service: Vascular;  Laterality: Left;  . BASCILIC VEIN TRANSPOSITION Right 11/08/2014   Procedure: FIRST STAGE BASILIC VEIN TRANSPOSITION;  Surgeon: Angelia Mould, MD;  Location: [place];  Service: Vascular;  Laterality: Right;  . BASCILIC VEIN TRANSPOSITION Right 01/18/2015   Procedure: SECOND STAGE BASILIC VEIN TRANSPOSITION;  Surgeon: Angelia Mould, MD;  Location: [place];  Service: Vascular;  Laterality: Right;  . CRANIOTOMY N/A 08/23/2017   Procedure: CRANIOTOMY HEMATOMA EVACUATION SUBDURAL;  Surgeon: Ashok Pall, MD;  Location: [place];  Service: Neurosurgery;  Laterality: N/A;  . CRANIOTOMY Left 08/24/2017   Procedure: CRANIOTOMY FOR RECURRENT ACUTE SUBDURAL HEMATOMA;  Surgeon: Ashok Pall, MD;  Location: [place];  Service: Neurosurgery;  Laterality: Left;  . ESOPHAGOGASTRODUODENOSCOPY N/A 03/31/2013   Procedure: ESOPHAGOGASTRODUODENOSCOPY (EGD);  Surgeon: Gatha Mayer, MD;  Location: [facility]COPY;  Service: Endoscopy;  Laterality: N/A;  . EYE SURGERY     laser surgery  . FEMORAL-POPLITEAL BYPASS GRAFT Left 07/08/2016   Procedure: LEFT  FEMORAL-BELOW KNEE POPLITEAL ARTERY BYPASS GRAFT USING 6MM X 80 CM PROPATEN GORETEX GRAFT WITH RINGS.;  Surgeon: Rosetta Posner, MD;  Location: [place];  Service: Vascular;  Laterality: Left;  . FEMORAL-POPLITEAL BYPASS GRAFT Right 08/11/2017   Procedure: RIGHT FEMORAL TO BELOW KNEE POPLITEAL ARTERKY  BYPASS GRAFT USING 6MM RINGED PROPATEN GRAFT;  Surgeon: Rosetta Posner, MD;  Location: [place];  Service: Vascular;  Laterality: Right;  . FISTULOGRAM Left 10/29/2014   Procedure: FISTULOGRAM;  Surgeon: Angelia Mould, MD;  Location: [facility];  Service: Cardiovascular;  Laterality: Left;  [place] GAS/FLUID EXCHANGE Left 12/20/2018   Procedure: AIR GAS EXCHANGE;  Surgeon: Jalene Mullet, MD;  Location: [place];  Service: Ophthalmology;  Laterality: Left;  . KNEE ARTHROSCOPY Right 05/06/2018    Procedure: RIGHT KNEE ARTHROSCOPY;  Surgeon: Newt Minion, MD;  Location: [place];  Service: Orthopedics;  Laterality: Right;  . KNEE ARTHROSCOPY Right 06/10/2018   Procedure: RIGHT KNEE ARTHROSCOPY AND DEBRIDEMENT;  Surgeon: Newt Minion, MD;  Location: [place];  Service: Orthopedics;  Laterality: Right;  . LIGATION OF ARTERIOVENOUS  FISTULA Left 04/21/2016   Procedure: LIGATION OF ARTERIOVENOUS  FISTULA LEFT ARM;  Surgeon: Elam Dutch, MD;  Location: [place];  Service: Vascular;  Laterality: Left;  . LOWER EXTREMITY ANGIOGRAPHY N/A 04/06/2017   Procedure: Lower Extremity Angiography - Right;  Surgeon: Serafina Mitchell, MD;  Location: [place] [facility];  Service: Cardiovascular;  Laterality: N/A;  . MEMBRANE PEEL Left 12/20/2018   Procedure: MEMBRANE PEEL;  Surgeon: Jalene Mullet, MD;  Location: [place];  Service: Ophthalmology;  Laterality: Left;  . PATCH ANGIOPLASTY Right 01/18/2015   Procedure: BASILIC VEIN PATCH ANGIOPLASTY USING VASCUGUARD PATCH;  Surgeon: Angelia Mould, MD;  [facility];  Service: Vascular;  Laterality: Right;  . PERIPHERAL VASCULAR BALLOON ANGIOPLASTY  09/22/2016   Procedure: Peripheral Vascular Balloon Angioplasty;  Surgeon: Serafina Mitchell, MD;  Location: [place] [facility];  Service: Cardiovascular;;  Lt. Fistula  . PERIPHERAL VASCULAR BALLOON ANGIOPLASTY Left 03/22/2018   Procedure: PERIPHERAL VASCULAR BALLOON ANGIOPLASTY;  Surgeon: Serafina Mitchell, MD;  Location: [place] [facility];  Service: Cardiovascular;  Laterality: Left;  Arm shunt  . PERIPHERAL VASCULAR CATHETERIZATION N/A 06/23/2016   Procedure: Abdominal Aortogram w/Lower Extremity;  Surgeon: Serafina Mitchell, MD;  Location: [place] [facility];  Service: Cardiovascular;  Laterality: N/A;  . PERIPHERAL VASCULAR CATHETERIZATION  06/23/2016   Procedure: Peripheral Vascular Intervention;  Surgeon: Serafina Mitchell, MD;  Location: [place] [facility];  Service: Cardiovascular;;  lt common and external  illiac artery  . PHOTOCOAGULATION WITH LASER Left 12/20/2018   Procedure: PHOTOCOAGULATION WITH LASER;  Surgeon: Jalene Mullet, MD;  Location: [place];  Service: Ophthalmology;  Laterality: Left;  . 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   Procedure: REPAIR OF COMPLEX TRACTION RETINAL DETACHMENT;  Surgeon: Jalene Mullet, MD;  Location: [place];  Service: Ophthalmology;  Laterality: Left;     A IV Location/Drains/Wounds Patient Lines/Drains/Airways Status   Active Line/Drains/Airways    Name:   Placement date:   Placement time:   Site:   Days:   Peripheral IV 05/17/19 Left Hand   05/17/19    1935    Hand   1   Fistula / Graft Right Upper arm Arteriovenous vein graft   -    -    Upper arm      Fistula / Graft Left Upper arm Arteriovenous vein graft   -    -    Upper arm      Incision (Closed) 06/10/18 Knee Right   06/10/18    0754     342   Incision (Closed) 12/20/18 Eye Left   12/20/18    0915     149          Intake/Output Last 24 hours No intake or output data in the 24 hours ending 05/18/19 0134  Labs/Imaging Results for orders placed or performed during the hospital encounter of 05/17/19 (from the past 48 hour(s))  Lactic acid, plasma     Status: None   Collection Time: 05/17/19  7:36 PM  Result Value Ref Range   Lactic Acid, Venous 1.6 0.5 - 1.9 mmol/L    Comment: Performed at [facility], [place] [address], [place], [place] [address]  Comprehensive metabolic panel     Status: Abnormal   Collection Time: 05/17/19  7:36 PM  Result Value Ref Range   Sodium 137 135 - 145 mmol/L   Potassium 2.2 (LL) 3.5 - 5.1 mmol/L    Comment: CRITICAL RESULT CALLED TO, READ BACK BY AND VERIFIED WITH: Gaetano Net 2038 05/17/2019 WBOND    Chloride 94 (L) 98 - 111 mmol/L   CO2 30 22 - 32 mmol/L   Glucose, Bld 189 (H) 70 - 99 mg/dL   BUN 9 8 - 23 mg/dL   Creatinine, Ser 3.31 (H) 0.44 - 1.00 mg/dL   Calcium 8.0 (L) 8.9 - 10.3 mg/dL   Total Protein 6.8 6.5 - 8.1 g/dL    Albumin 3.0 (L) 3.5 - 5.0 g/dL   AST 35 15 - 41 U/L   ALT 17 0 - 44 U/L   Alkaline Phosphatase 138 (H)  38 - 126 U/L   Total Bilirubin 0.9 0.3 - 1.2 mg/dL   GFR calc non Af Amer 14 (L) >60 mL/min   GFR calc Af Amer 16 (L) >60 mL/min   Anion gap 13 5 - 15    Comment: Performed at [place] [facility] [address], [place], [place] [address]  CBC WITH DIFFERENTIAL     Status: Abnormal   Collection Time: 05/17/19  7:36 PM  Result Value Ref Range   WBC 4.8 4.0 - 10.5 K/uL   RBC 3.32 (L) 3.87 - 5.11 MIL/uL   Hemoglobin 10.3 (L) 12.0 - 15.0 g/dL   HCT 32.1 (L) 36.0 - 46.0 %   MCV 96.7 80.0 - 100.0 fL   MCH 31.0 26.0 - 34.0 pg   MCHC 32.1 30.0 - 36.0 g/dL   RDW 13.2 11.5 - 15.5 %   Platelets 175 150 - 400 K/uL   nRBC 0.4 (H) 0.0 - 0.2 %   Neutrophils Relative % 76 %   Neutro Abs 3.7 1.7 - 7.7 K/uL   Lymphocytes Relative 17 %   Lymphs Abs 0.8 0.7 - 4.0 K/uL   Monocytes Relative 5 %   Monocytes Absolute 0.2 0.1 - 1.0 K/uL   Eosinophils Relative 0 %   Eosinophils Absolute 0.0 0.0 - 0.5 K/uL   Basophils Relative 0 %   Basophils Absolute 0.0 0.0 - 0.1 K/uL   Immature Granulocytes 2 %   Abs Immature Granulocytes 0.08 (H) 0.00 - 0.07 K/uL    Comment: Performed at [place] [facility] [address], [place], [place] [address]  APTT     Status: Abnormal   Collection Time: 05/17/19  7:36 PM  Result Value Ref Range   aPTT 43 (H) 24 - 36 seconds    Comment:        IF BASELINE aPTT IS ELEVATED, SUGGEST PATIENT RISK ASSESSMENT BE USED TO DETERMINE APPROPRIATE ANTICOAGULANT THERAPY. Performed at [facility], [place] [address] [place], [place] [address]   Protime-INR     Status: None   Collection Time: 05/17/19  7:36 PM  Result Value Ref Range   Prothrombin Time 13.8 11.4 - 15.2 seconds   INR 1.1 0.8 - 1.2    Comment: (NOTE) INR goal varies based on device and disease states. Performed at [facility], [place] [address], [place], [place] [address]   SARS CORONAVIRUS 2 (TAT  6-24 HRS) Nasopharyngeal Nasopharyngeal Swab     Status: Abnormal   Collection Time: 05/17/19  7:40 PM   Specimen: Nasopharyngeal Swab  Result Value Ref Range   SARS Coronavirus 2 POSITIVE (A) NEGATIVE    Comment: RESULT CALLED TO, READ BACK BY AND VERIFIED WITH: RIEARY A, RN AT 0040 ON 05/18/2019 BY SAINVILUS S (NOTE) SARS-CoV-2 target nucleic acids are DETECTED. The SARS-CoV-2 RNA is generally detectable in upper and lower respiratory specimens during the acute phase of infection. Positive results are indicative of active infection with SARS-CoV-2. Clinical  correlation with patient history and other diagnostic information is necessary to determine patient infection status. Positive results do  not rule out bacterial infection or co-infection with other viruses. The expected result is Negative. Fact Sheet for Patients: SugarRoll.be Fact Sheet for Healthcare Providers: https://www.woods-mathews.com/ This test is not yet approved or cleared by the [place] FDA and  has been authorized for detection and/or diagnosis of SARS-CoV-2 by FDA under an Emergency Use Authorization (EUA). This EUA will remain  in effect (meaning this  test can  be used) for the duration of the COVID-19 declaration under Section 564(b)(1) of the Act, 21 U.S.C. section 360bbb-3(b)(1), unless the authorization is terminated or revoked sooner. Performed at [facility], [place] [address], [place], [place] [address]   Magnesium     Status: None   Collection Time: 05/17/19  8:43 PM  Result Value Ref Range   Magnesium 1.8 1.7 - 2.4 mg/dL    Comment: Performed at [facility], [place] [address] [place], [place] [address]   Dg Chest Port 1 View  Result Date: 05/17/2019 CLINICAL DATA:  Initial evaluation for acute cough, fever. EXAM: PORTABLE CHEST 1 VIEW COMPARISON:  Prior radiograph from 06/18/2018. FINDINGS: Mild cardiomegaly, stable. Mediastinal silhouette  within normal limits. Aortic atherosclerosis. Lungs normally inflated. Patchy and hazy opacity within the right lung base, suspicious for possible infectious pneumonitis given history of cough and fever. Underlying diffuse pulmonary vascular congestion with interstitial prominence, suggesting a degree of pulmonary interstitial edema. No obvious pleural effusion. No pneumothorax. No acute osseous finding. Osteopenia noted. IMPRESSION: 1. Patchy and hazy opacity within the right lung base, suspicious for acute infectious pneumonitis given history of cough and fever. 2. Underlying diffuse pulmonary vascular congestion with interstitial prominence, suggesting a degree of pulmonary interstitial edema. 3.  Aortic Atherosclerosis (ICD10-I70.0). Electronically Signed   By: Jeannine Boga M.D.   On: 05/17/2019 19:26    Pending Labs Unresulted Labs (From admission, onward)    Start     Ordered   05/17/19 1846  Blood Culture (routine x 2)  BLOOD CULTURE X 2,   STAT     05/17/19 1845   05/17/19 1846  Urinalysis, Routine w reflex microscopic  ONCE - STAT,   STAT     05/17/19 1845   05/17/19 1846  Urine culture  ONCE - STAT,   STAT     05/17/19 1845          Vitals/Pain Today's Vitals   05/17/19 1845 05/17/19 1900 05/17/19 1945 05/17/19 2207  BP: (!) 154/60 (!) 151/76 139/81 (!) 132/49  Pulse:      Resp: 19 (!) 26 (!) 22 (!) 21  Temp:      TempSrc:      SpO2:    98%    Isolation Precautions No active isolations  Medications Medications  acetaminophen (TYLENOL) tablet 650 mg (650 mg Oral Given 05/17/19 1943)  ondansetron (ZOFRAN-ODT) disintegrating tablet 4 mg (4 mg Oral Given 05/17/19 1943)  potassium chloride SA (KLOR-CON) CR tablet 20 mEq (20 mEq Oral Given 05/18/19 0043)    Mobility walks with device Low fall risk   Focused Assessments Pulmonary Assessment Handoff:  Lung sounds: Bilateral Breath Sounds: Diminished L Breath Sounds: Diminished R Breath Sounds: Diminished O2  Device: Nasal Cannula O2 Flow Rate (L/min): 2 L/min      R Recommendations: See Admitting Provider Note  Report given to:   Additional Notes:

## 2019-05-19 LAB — BASIC METABOLIC PANEL
Anion gap: 15 (ref 5–15)
BUN: 23 mg/dL (ref 8–23)
CO2: 28 mmol/L (ref 22–32)
Calcium: 6.9 mg/dL — ABNORMAL LOW (ref 8.9–10.3)
Chloride: 94 mmol/L — ABNORMAL LOW (ref 98–111)
Creatinine, Ser: 6.23 mg/dL — ABNORMAL HIGH (ref 0.44–1.00)
GFR calc Af Amer: 7 mL/min — ABNORMAL LOW (ref >60)
GFR calc non Af Amer: 6 mL/min — ABNORMAL LOW (ref >60)
Glucose, Bld: 82 mg/dL (ref 70–99)
Potassium: 2.6 mmol/L — CL (ref 3.5–5.1)
Sodium: 137 mmol/L (ref 135–145)

## 2019-05-19 LAB — CBC WITH DIFFERENTIAL/PLATELET
Abs Immature Granulocytes: 0.13 10*3/uL — ABNORMAL HIGH (ref 0.00–0.07)
Basophils Absolute: 0 10*3/uL (ref 0.0–0.1)
Basophils Relative: 1 %
Eosinophils Absolute: 0 10*3/uL (ref 0.0–0.5)
Eosinophils Relative: 1 %
HCT: 29.5 % — ABNORMAL LOW (ref 36.0–46.0)
Hemoglobin: 9.2 g/dL — ABNORMAL LOW (ref 12.0–15.0)
Immature Granulocytes: 2 %
Lymphocytes Relative: 31 %
Lymphs Abs: 1.9 10*3/uL (ref 0.7–4.0)
MCH: 30.6 pg (ref 26.0–34.0)
MCHC: 31.2 g/dL (ref 30.0–36.0)
MCV: 98 fL (ref 80.0–100.0)
Monocytes Absolute: 0.4 10*3/uL (ref 0.1–1.0)
Monocytes Relative: 7 %
Neutro Abs: 3.8 10*3/uL (ref 1.7–7.7)
Neutrophils Relative %: 58 %
Platelets: 187 10*3/uL (ref 150–400)
RBC: 3.01 MIL/uL — ABNORMAL LOW (ref 3.87–5.11)
RDW: 13.5 % (ref 11.5–15.5)
WBC: 6.3 10*3/uL (ref 4.0–10.5)
nRBC: 0.3 % — ABNORMAL HIGH (ref 0.0–0.2)

## 2019-05-19 LAB — GLUCOSE, CAPILLARY
Glucose-Capillary: 140 mg/dL — ABNORMAL HIGH (ref 70–99)
Glucose-Capillary: 72 mg/dL (ref 70–99)
Glucose-Capillary: 122 mg/dL — ABNORMAL HIGH (ref 70–99)
Glucose-Capillary: 89 mg/dL (ref 70–99)
Glucose-Capillary: 154 mg/dL — ABNORMAL HIGH (ref 70–99)

## 2019-05-19 LAB — C-REACTIVE PROTEIN: CRP: 8.1 mg/dL — ABNORMAL HIGH (ref <1.0)

## 2019-05-19 LAB — FERRITIN: Ferritin: 1645 ng/mL — ABNORMAL HIGH (ref 11–307)

## 2019-05-19 LAB — LACTATE DEHYDROGENASE: LDH: 238 U/L — ABNORMAL HIGH (ref 98–192)

## 2019-05-19 LAB — MAGNESIUM: Magnesium: 1.8 mg/dL (ref 1.7–2.4)

## 2019-05-19 MED ORDER — HEPARIN SODIUM (PORCINE) 1000 UNIT/ML DIALYSIS: 2000.0000 [IU] | INTRAMUSCULAR | Status: DC | PRN

## 2019-05-19 MED ORDER — CINACALCET HCL 30 MG PO TABS
60.0000 mg | ORAL_TABLET | ORAL | Status: DC
  Administered 2019-05-20 – 2019-05-22 (×2): 60 mg via ORAL
  Filled 2019-05-19 (×3): qty 2

## 2019-05-19 MED ORDER — POTASSIUM CHLORIDE CRYS ER 20 MEQ PO TBCR
30.0000 meq | EXTENDED_RELEASE_TABLET | Freq: Once | ORAL | Status: AC
  Administered 2019-05-19: 30 meq via ORAL
  Filled 2019-05-19: qty 1

## 2019-05-19 MED ORDER — DOXERCALCIFEROL 4 MCG/2ML IV SOLN
8.0000 ug | INTRAVENOUS | Status: DC
  Administered 2019-05-22: 8 ug via INTRAVENOUS
  Filled 2019-05-19: qty 4

## 2019-05-19 MED ORDER — AMLODIPINE BESYLATE 5 MG PO TABS
2.5000 mg | ORAL_TABLET | Freq: Two times a day (BID) | ORAL | Status: DC
  Administered 2019-05-19 – 2019-05-22 (×5): 2.5 mg via ORAL
  Filled 2019-05-19 (×7): qty 1

## 2019-05-19 NOTE — Progress Notes (Signed)
VAST consulted to place PIV for Remdesivir. [place] used to place PIV as charted. Pt reported she is allergic to all tape and only Tegaderm can be used. Utilized small pieces of sterile tape to secure catheter beneath Tegaderm before she verbalized tape allergy. Also used small pieces of tape outside of Tegaderm to secure catheter to dsg. Further used drape from IV kit to wrap around extension tubing to secure in place with tape around drape. Reported above to unit nurse and requested a gentle hand be used when accessing IV.

## 2019-05-19 NOTE — Progress Notes (Signed)
Lab called via telephone re: critical lab value - K 2.6 - Provider on call paged

## 2019-05-19 NOTE — Progress Notes (Addendum)
Patient has scheduled dialysis today. Per dialysis hold Remdesivir til after dialysis. Pharmacy rescheduled for 1800 today.

## 2019-05-19 NOTE — Progress Notes (Signed)
Patient ID: Destiny Day, female   DOB: 10/28/1950, 68 y.o.   MRN: [id number]  PROGRESS NOTE    Destiny Day  HYI:[id number] DOB: 10-19-1950 DOA: 05/17/2019 PCP: Velna Hatchet, MD   Brief Narrative:  68 year old female with history of end-stage renal on dialysis, diabetes mellitus type 2, chronic anemia, hypertension presented with fever and chills.  Chest x-ray showed infiltrates concerning for pneumonitis and COVID-19 test was positive.  CRP was 9.7, ferritin 1700 on presentation.  Assessment & Plan:   COVID-19 pneumonia Hypoxia -Chest x-ray showed infiltrates on presentation.  Currently still requiring 2 L oxygen via nasal valley.  Temperature max of 100.5 over the last 24 hours. -Ferritin 1645, LDH 238 and CRP 8.1 today.  Inflammatory markers are slightly improving. -Continue remdesivir for total of 5 days.  Not on steroids because of history of angioedema to prednisone  Diabetes mellitus type 2 -Continue NPH insulin along with CBGs with SSI.  Hypertension -Continue Coreg, BiDil and Norvasc  End-stage renal disease on hemodialysis--nephrology following.  Dialysis as per nephrology schedule  Hypokalemia -replace potassium.  Repeat a.m. labs  Hyperlipidemia-continue statin  Anemia of chronic disease -From chronic kidney disease.  Hemoglobin stable.  Monitor  DVT prophylaxis: Heparin Code Status: Full Family Communication: Spoke to husband on phone Disposition Plan: Home in 2 to 4 days if clinically improves  Consultants: Nephrology  Procedures: None  Antimicrobials: None   Subjective: Patient seen and examined at bedside.  She still feels weak and had fever last night.  Had some diarrhea yesterday.  No overnight nausea, vomiting, worsening shortness of breath.  Still has intermittent cough.  Objective: Vitals:   05/18/19 2321 05/19/19 0010 05/19/19 0300 05/19/19 0400  BP: (!) 137/50  (!) 114/58   Pulse:   73   Resp:      Temp:   98.7 F (37.1 C)    TempSrc:   Axillary   SpO2:   98%   Weight:  64.5 kg  64.5 kg  Height:       No intake or output data in the 24 hours ending 05/19/19 0748 Filed Weights   05/18/19 0551 05/19/19 0010 05/19/19 0400  Weight: 69.6 kg 64.5 kg 64.5 kg    Examination:  General exam: Appears calm and comfortable.   Respiratory system: Bilateral decreased breath sounds at bases with some scattered crackles Cardiovascular system: S1 & S2 heard, Rate controlled Gastrointestinal system: Abdomen is nondistended, soft and nontender. Normal bowel sounds heard. Extremities: No cyanosis, clubbing; trace edema   Data Reviewed: I have personally reviewed following labs and imaging studies  CBC: Recent Labs  Lab 05/17/19 1936 05/18/19 0348 05/19/19 0435  WBC 4.8 4.1 6.3  NEUTROABS 3.7 2.4 3.8  HGB 10.3* 10.4* 9.2*  HCT 32.1* 32.3* 29.5*  MCV 96.7 96.1 98.0  PLT 175 178 786   Basic Metabolic Panel: Recent Labs  Lab 05/17/19 1936 05/17/19 2043 05/18/19 0348 05/19/19 0435  NA 137  --  137 137  K 2.2*  --  2.8* 2.6*  CL 94*  --  94* 94*  CO2 30  --  29 28  GLUCOSE 189*  --  142* 82  BUN 9  --  13 23  CREATININE 3.31*  --  3.98* 6.23*  CALCIUM 8.0*  --  8.0* 6.9*  MG  --  1.8  --  1.8   GFR: Estimated Creatinine Clearance: 7.8 mL/min (A) (by C-G formula based on SCr of 6.23 mg/dL (H)). Liver Function Tests: Recent  Labs  Lab 05/17/19 1936 05/18/19 0348  AST 35 40  ALT 17 16  ALKPHOS 138* 129*  BILITOT 0.9 1.1  PROT 6.8 6.6  ALBUMIN 3.0* 2.8*   No results for input(s): LIPASE, AMYLASE in the last 168 hours. No results for input(s): AMMONIA in the last 168 hours. Coagulation Profile: Recent Labs  Lab 05/17/19 1936  INR 1.1   Cardiac Enzymes: No results for input(s): CKTOTAL, CKMB, CKMBINDEX, TROPONINI in the last 168 hours. BNP (last 3 results) No results for input(s): PROBNP in the last 8760 hours. HbA1C: No results for input(s): HGBA1C in the last 72 hours. CBG: Recent Labs   Lab 05/18/19 0913 05/18/19 1157 05/18/19 1643  GLUCAP 111* 113* 144*   Lipid Profile: No results for input(s): CHOL, HDL, LDLCALC, TRIG, CHOLHDL, LDLDIRECT in the last 72 hours. Thyroid Function Tests: No results for input(s): TSH, T4TOTAL, FREET4, T3FREE, THYROIDAB in the last 72 hours. Anemia Panel: Recent Labs    05/18/19 0440 05/19/19 0435  FERRITIN 1,787* 1,645*   Sepsis Labs: Recent Labs  Lab 05/17/19 1936  LATICACIDVEN 1.6    Recent Results (from the past 240 hour(s))  Blood Culture (routine x 2)     Status: None (Preliminary result)   Collection Time: 05/17/19  7:40 PM   Specimen: BLOOD RIGHT HAND  Result Value Ref Range Status   Specimen Description BLOOD RIGHT HAND  Final   Special Requests   Final    BOTTLES DRAWN AEROBIC AND ANAEROBIC Blood Culture adequate volume   Culture   Final    NO GROWTH 2 DAYS Performed at [facility], [place] [address], [place], [place] [address]    Report Status PENDING  Incomplete  SARS CORONAVIRUS 2 (TAT 6-24 HRS) Nasopharyngeal Nasopharyngeal Swab     Status: Abnormal   Collection Time: 05/17/19  7:40 PM   Specimen: Nasopharyngeal Swab  Result Value Ref Range Status   SARS Coronavirus 2 POSITIVE (A) NEGATIVE Final    Comment: RESULT CALLED TO, READ BACK BY AND VERIFIED WITH: RIEARY A, RN AT 0040 ON 05/18/2019 BY SAINVILUS S (NOTE) SARS-CoV-2 target nucleic acids are DETECTED. The SARS-CoV-2 RNA is generally detectable in upper and lower respiratory specimens during the acute phase of infection. Positive results are indicative of active infection with SARS-CoV-2. Clinical  correlation with patient history and other diagnostic information is necessary to determine patient infection status. Positive results do  not rule out bacterial infection or co-infection with other viruses. The expected result is Negative. Fact Sheet for Patients: SugarRoll.be Fact Sheet for Healthcare Providers:  https://www.woods-mathews.com/ This test is not yet approved or cleared by the [place] FDA and  has been authorized for detection and/or diagnosis of SARS-CoV-2 by FDA under an Emergency Use Authorization (EUA). This EUA will remain  in effect (meaning this test can  be used) for the duration of the COVID-19 declaration under Section 564(b)(1) of the Act, 21 U.S.C. section 360bbb-3(b)(1), unless the authorization is terminated or revoked sooner. Performed at [facility], [place] [address] [name], [place] [address]          Radiology Studies: Dg Chest Port 1 View  Result Date: 05/17/2019 CLINICAL DATA:  Initial evaluation for acute cough, fever. EXAM: PORTABLE CHEST 1 VIEW COMPARISON:  Prior radiograph from 06/18/2018. FINDINGS: Mild cardiomegaly, stable. Mediastinal silhouette within normal limits. Aortic atherosclerosis. Lungs normally inflated. Patchy and hazy opacity within the right lung base, suspicious for possible infectious pneumonitis given history of cough and  fever. Underlying diffuse pulmonary vascular congestion with interstitial prominence, suggesting a degree of pulmonary interstitial edema. No obvious pleural effusion. No pneumothorax. No acute osseous finding. Osteopenia noted. IMPRESSION: 1. Patchy and hazy opacity within the right lung base, suspicious for acute infectious pneumonitis given history of cough and fever. 2. Underlying diffuse pulmonary vascular congestion with interstitial prominence, suggesting a degree of pulmonary interstitial edema. 3.  Aortic Atherosclerosis (ICD10-I70.0). Electronically Signed   By: Jeannine Boga M.D.   On: 05/17/2019 19:26        Scheduled Meds: . amLODipine  10 mg Oral QHS  . aspirin EC  81 mg Oral Daily  . atorvastatin  20 mg Oral Daily  . carvedilol  12.5 mg Oral Once per day on Sun Tue Thu Sat   And  . carvedilol  12.5 mg Oral QAC supper  . Chlorhexidine Gluconate Cloth  6 each Topical  Q0600  . cinacalcet  90 mg Oral Once per day on Sun Tue Thu Sat   And  . cinacalcet  90 mg Oral Q supper  . ferric citrate  420 mg Oral TID WC  . gabapentin  100 mg Oral QHS  . heparin  5,000 Units Subcutaneous Q8H  . insulin aspart  0-9 Units Subcutaneous TID WC  . insulin NPH Human  10 Units Subcutaneous QHS  . isosorbide-hydrALAZINE  1 tablet Oral 2 times per day on Sun Tue Thu Sat   And  . isosorbide-hydrALAZINE  1 tablet Oral QHS  . mometasone-formoterol  2 puff Inhalation BID  . multivitamin  1 tablet Oral QHS  . pantoprazole  40 mg Oral Daily   Continuous Infusions: . remdesivir 100 mg in NS 250 mL            Aline August, MD [facility] 05/19/2019, 7:48 AM

## 2019-05-19 NOTE — Progress Notes (Signed)
[facility] Progress Note  Subjective:  Patient not examined directly given COVID-19 + status, utilizing exam of the primary team and observations of RN's.    Vitals:   05/19/19 0010 05/19/19 0300 05/19/19 0400 05/19/19 0754  BP:  (!) 114/58  129/83  Pulse:  73  71  Resp:      Temp:  98.7 F (37.1 C)  98.2 F (36.8 C)  TempSrc:  Axillary  Oral  SpO2:  98%  100%  Weight: 64.5 kg  64.5 kg   Height:        Inpatient medications: . amLODipine  10 mg Oral QHS  . aspirin EC  81 mg Oral Daily  . atorvastatin  20 mg Oral Daily  . carvedilol  12.5 mg Oral Once per day on Sun Tue Thu Sat   And  . carvedilol  12.5 mg Oral QAC supper  . Chlorhexidine Gluconate Cloth  6 each Topical Q0600  . cinacalcet  90 mg Oral Once per day on Sun Tue Thu Sat   And  . cinacalcet  90 mg Oral Q supper  . ferric citrate  420 mg Oral TID WC  . gabapentin  100 mg Oral QHS  . heparin  5,000 Units Subcutaneous Q8H  . insulin aspart  0-9 Units Subcutaneous TID WC  . insulin NPH Human  10 Units Subcutaneous QHS  . isosorbide-hydrALAZINE  1 tablet Oral 2 times per day on Sun Tue Thu Sat   And  . isosorbide-hydrALAZINE  1 tablet Oral QHS  . mometasone-formoterol  2 puff Inhalation BID  . multivitamin  1 tablet Oral QHS  . pantoprazole  40 mg Oral Daily  . potassium chloride  30 mEq Oral Once   . remdesivir 100 mg in NS 250 mL     acetaminophen **OR** acetaminophen, albuterol, diazepam, ferric citrate, fluticasone, guaiFENesin, loperamide, ondansetron **OR** ondansetron (ZOFRAN) IV, senna    Exam:  Patient not examined directly given COVID-19 + status, utilizing exam of the primary team and observations of RN's.      Home meds:  - amlodipine 10/ carvedilol / isosorbide-hydralazine  - insulin NPH 10hs/ regular 3- 8u SSI  - cinacalcet qd/ ferric citrate ac  - aspirin 81/ atrovastatin 20  - gabapentin 100hs/ diazepam 5 bid prn/ oxy-acetaminophen qid prn  - omeprazole 40/ symbicort  qd prn   - prn's/ vitamins/ supplements    Outpt HD: [facility]    4h  67kg  2/2.25 bath  AVG   Hep 2500 w/ 1036midrun  - hect 8ug  - mircera 50ug q4wk, last 10/14  - venofer 100 x 5, one left    Assessment/ Plan: 1. COVID+ PNA - on 2L [place], +febrile 2. ESRD - on HD MWF. Had OP HD Wed. HD today.  3. HTN/ Vol - BP's low normal, lower norvasc. Up 2-3kg by wts. UF to dry wt w/ HD today. 4. DM on insulin 5. Anemia ckd - last esa on 10/14, next due on 10/28      Rob Parley Pidcock 05/19/2019, 9:04 AM  Iron/TIBC/Ferritin/ %Sat    Component Value Date/Time   IRON 31 08/28/2017 0414   TIBC 140 (L) 08/28/2017 0414   FERRITIN 1,645 (H) 05/19/2019 0435   IRONPCTSAT 22 08/28/2017 0414   Recent Labs  Lab 05/17/19 1936 05/18/19 0348 05/19/19 0435  NA 137 137 137  K 2.2* 2.8* 2.6*  CL 94* 94* 94*  CO2 30 29 28   GLUCOSE 189* 142* 82  BUN 9  13 23  CREATININE 3.31* 3.98* 6.23*  CALCIUM 8.0* 8.0* 6.9*  ALBUMIN 3.0* 2.8*  --   INR 1.1  --   --    Recent Labs  Lab 05/18/19 0348  AST 40  ALT 16  ALKPHOS 129*  BILITOT 1.1  PROT 6.6   Recent Labs  Lab 05/19/19 0435  WBC 6.3  HGB 9.2*  HCT 29.5*  PLT 187

## 2019-05-20 LAB — CBC WITH DIFFERENTIAL/PLATELET
Abs Immature Granulocytes: 0.31 10*3/uL — ABNORMAL HIGH (ref 0.00–0.07)
Basophils Absolute: 0 10*3/uL (ref 0.0–0.1)
Basophils Relative: 1 %
Eosinophils Absolute: 0.1 10*3/uL (ref 0.0–0.5)
Eosinophils Relative: 2 %
HCT: 33.3 % — ABNORMAL LOW (ref 36.0–46.0)
Hemoglobin: 10.5 g/dL — ABNORMAL LOW (ref 12.0–15.0)
Immature Granulocytes: 5 %
Lymphocytes Relative: 40 %
Lymphs Abs: 2.6 10*3/uL (ref 0.7–4.0)
MCH: 30.7 pg (ref 26.0–34.0)
MCHC: 31.5 g/dL (ref 30.0–36.0)
MCV: 97.4 fL (ref 80.0–100.0)
Monocytes Absolute: 0.4 10*3/uL (ref 0.1–1.0)
Monocytes Relative: 6 %
Neutro Abs: 2.9 10*3/uL (ref 1.7–7.7)
Neutrophils Relative %: 46 %
Platelets: 209 10*3/uL (ref 150–400)
RBC: 3.42 MIL/uL — ABNORMAL LOW (ref 3.87–5.11)
RDW: 13.6 % (ref 11.5–15.5)
WBC: 6.4 10*3/uL (ref 4.0–10.5)
nRBC: 0 % (ref 0.0–0.2)

## 2019-05-20 LAB — C-REACTIVE PROTEIN: CRP: 6.9 mg/dL — ABNORMAL HIGH (ref <1.0)

## 2019-05-20 LAB — GLUCOSE, CAPILLARY
Glucose-Capillary: 73 mg/dL (ref 70–99)
Glucose-Capillary: 68 mg/dL — ABNORMAL LOW (ref 70–99)
Glucose-Capillary: 83 mg/dL (ref 70–99)
Glucose-Capillary: 84 mg/dL (ref 70–99)
Glucose-Capillary: 139 mg/dL — ABNORMAL HIGH (ref 70–99)

## 2019-05-20 LAB — BASIC METABOLIC PANEL
Anion gap: 13 (ref 5–15)
BUN: 11 mg/dL (ref 8–23)
CO2: 27 mmol/L (ref 22–32)
Calcium: 7.3 mg/dL — ABNORMAL LOW (ref 8.9–10.3)
Chloride: 97 mmol/L — ABNORMAL LOW (ref 98–111)
Creatinine, Ser: 3.82 mg/dL — ABNORMAL HIGH (ref 0.44–1.00)
GFR calc Af Amer: 13 mL/min — ABNORMAL LOW (ref >60)
GFR calc non Af Amer: 11 mL/min — ABNORMAL LOW (ref >60)
Glucose, Bld: 56 mg/dL — ABNORMAL LOW (ref 70–99)
Potassium: 2.9 mmol/L — ABNORMAL LOW (ref 3.5–5.1)
Sodium: 137 mmol/L (ref 135–145)

## 2019-05-20 LAB — FERRITIN: Ferritin: 2503 ng/mL — ABNORMAL HIGH (ref 11–307)

## 2019-05-20 LAB — LACTATE DEHYDROGENASE: LDH: 291 U/L — ABNORMAL HIGH (ref 98–192)

## 2019-05-20 MED ORDER — HYDROCOD POLST-CPM POLST ER 10-8 MG/5ML PO SUER
5.0000 mL | Freq: Two times a day (BID) | ORAL | Status: DC | PRN
  Administered 2019-05-20 – 2019-05-21 (×2): 5 mL via ORAL
  Filled 2019-05-20 (×3): qty 5

## 2019-05-20 MED ORDER — GUAIFENESIN-CODEINE 100-10 MG/5ML PO SOLN
5.0000 mL | Freq: Four times a day (QID) | ORAL | Status: DC | PRN
  Filled 2019-05-20 (×2): qty 5

## 2019-05-20 MED ORDER — POTASSIUM CHLORIDE CRYS ER 20 MEQ PO TBCR
40.0000 meq | EXTENDED_RELEASE_TABLET | Freq: Once | ORAL | Status: AC
  Administered 2019-05-20: 40 meq via ORAL
  Filled 2019-05-20: qty 2

## 2019-05-20 NOTE — Progress Notes (Signed)
Patient ID: Destiny Day, female   DOB: 1950-09-16, 68 y.o.   MRN: [id number]  PROGRESS NOTE    Destiny Day  VQM:[id number] DOB: 09-Jul-1951 DOA: 05/17/2019 PCP: Velna Hatchet, MD   Brief Narrative:  68 year old female with history of end-stage renal on dialysis, diabetes mellitus type 2, chronic anemia, hypertension presented with fever and chills.  Chest x-ray showed infiltrates concerning for pneumonitis and COVID-19 test was positive.  CRP was 9.7, ferritin 1700 on presentation.  Assessment & Plan:   COVID-19 pneumonia Hypoxia -Chest x-ray showed infiltrates on presentation.  Currently still requiring 2 L oxygen via nasal cannula.  No temperature spikes over the last 24 hours.  Recent Labs    05/18/19 0440 05/19/19 0435 05/20/19 0350  FERRITIN 1,787* 1,645* 2,503*  LDH  --  238* 291*  CRP 9.7* 8.1* 6.9*    Lab Results  Component Value Date   SARSCOV2NAA POSITIVE (A) 05/17/2019   [place] Not Detected 05/04/2019   [place] NEGATIVE 12/20/2018    -Continue monitoring telemetry markers -Continue remdesivir for total of 5 days.  Not on steroids because of history of angioedema to prednisone -Complains of increasing cough.  Diabetes mellitus type 2 -Continue NPH insulin along with CBGs with SSI.    Hypertension -Continue Coreg, BiDil and Norvasc  End-stage renal disease on hemodialysis--nephrology following.  Dialysis as per nephrology schedule  Hypokalemia -replace potassium.  Repeat a.m. labs  Hyperlipidemia-continue statin  Anemia of chronic disease -From chronic kidney disease.  Hemoglobin stable.  Monitor  DVT prophylaxis: Heparin Code Status: Full Family Communication: Spoke to husband on phone on 05/19/2019 Disposition Plan: Home in 2 to 4 days if clinically improves  Consultants: Nephrology  Procedures: None  Antimicrobials: None   Subjective: Patient seen and examined at bedside.  Denies worsening shortness of breath but  complains of increasing dry cough.  No overnight fever or vomiting.  Still feels weak.    Objective: Vitals:   05/19/19 1750 05/19/19 1800 05/19/19 1900 05/20/19 0500  BP: 135/60 133/62 (!) 124/94   Pulse: 68 64 71   Resp: 16 16 20    Temp: 98.2 F (36.8 C)  99.1 F (37.3 C)   TempSrc: Oral  Oral   SpO2:   100%   Weight: 62 kg   64.2 kg  Height:        Intake/Output Summary (Last 24 hours) at 05/20/2019 0738 Last data filed at 05/20/2019 0600 Gross per 24 hour  Intake 484.83 ml  Output 2700 ml  Net -2215.17 ml   Filed Weights   05/19/19 1420 05/19/19 1750 05/20/19 0500  Weight: 64.5 kg 62 kg 64.2 kg    Examination:  General exam: No distress. Respiratory system: Bilateral decreased breath sounds at bases.  No wheezing some crackles heard  cardiovascular system: Rate controlled, S1-S2 heard Gastrointestinal system: Abdomen is nondistended, soft and nontender. Normal bowel sounds heard. Extremities: No cyanosis; trace edema   Data Reviewed: I have personally reviewed following labs and imaging studies  CBC: Recent Labs  Lab 05/17/19 1936 05/18/19 0348 05/19/19 0435 05/20/19 0350  WBC 4.8 4.1 6.3 6.4  NEUTROABS 3.7 2.4 3.8 2.9  HGB 10.3* 10.4* 9.2* 10.5*  HCT 32.1* 32.3* 29.5* 33.3*  MCV 96.7 96.1 98.0 97.4  PLT 175 178 187 932   Basic Metabolic Panel: Recent Labs  Lab 05/17/19 1936 05/17/19 2043 05/18/19 0348 05/19/19 0435 05/20/19 0350  NA 137  --  137 137 137  K 2.2*  --  2.8* 2.6* 2.9*  CL 94*  --  94* 94* 97*  CO2 30  --  29 28 27   GLUCOSE 189*  --  142* 82 56*  BUN 9  --  13 23 11   CREATININE 3.31*  --  3.98* 6.23* 3.82*  CALCIUM 8.0*  --  8.0* 6.9* 7.3*  MG  --  1.8  --  1.8  --    GFR: Estimated Creatinine Clearance: 12.7 mL/min (A) (by C-G formula based on SCr of 3.82 mg/dL (H)). Liver Function Tests: Recent Labs  Lab 05/17/19 1936 05/18/19 0348  AST 35 40  ALT 17 16  ALKPHOS 138* 129*  BILITOT 0.9 1.1  PROT 6.8 6.6  ALBUMIN  3.0* 2.8*   No results for input(s): LIPASE, AMYLASE in the last 168 hours. No results for input(s): AMMONIA in the last 168 hours. Coagulation Profile: Recent Labs  Lab 05/17/19 1936  INR 1.1   Cardiac Enzymes: No results for input(s): CKTOTAL, CKMB, CKMBINDEX, TROPONINI in the last 168 hours. BNP (last 3 results) No results for input(s): PROBNP in the last 8760 hours. HbA1C: No results for input(s): HGBA1C in the last 72 hours. CBG: Recent Labs  Lab 05/18/19 2203 05/19/19 0815 05/19/19 1308 05/19/19 1822 05/19/19 2046  GLUCAP 140* 72 122* 89 154*   Lipid Profile: No results for input(s): CHOL, HDL, LDLCALC, TRIG, CHOLHDL, LDLDIRECT in the last 72 hours. Thyroid Function Tests: No results for input(s): TSH, T4TOTAL, FREET4, T3FREE, THYROIDAB in the last 72 hours. Anemia Panel: Recent Labs    05/19/19 0435 05/20/19 0350  FERRITIN 1,645* 2,503*   Sepsis Labs: Recent Labs  Lab 05/17/19 1936  LATICACIDVEN 1.6    Recent Results (from the past 240 hour(s))  Blood Culture (routine x 2)     Status: None (Preliminary result)   Collection Time: 05/17/19  7:40 PM   Specimen: BLOOD RIGHT HAND  Result Value Ref Range Status   Specimen Description BLOOD RIGHT HAND  Final   Special Requests   Final    BOTTLES DRAWN AEROBIC AND ANAEROBIC Blood Culture adequate volume   Culture   Final    NO GROWTH 3 DAYS Performed at [facility], [place] [address], [place], [place] [address]    Report Status PENDING  Incomplete  SARS CORONAVIRUS 2 (TAT 6-24 HRS) Nasopharyngeal Nasopharyngeal Swab     Status: Abnormal   Collection Time: 05/17/19  7:40 PM   Specimen: Nasopharyngeal Swab  Result Value Ref Range Status   SARS Coronavirus 2 POSITIVE (A) NEGATIVE Final    Comment: RESULT CALLED TO, READ BACK BY AND VERIFIED WITH: RIEARY A, RN AT 0040 ON 05/18/2019 BY SAINVILUS S (NOTE) SARS-CoV-2 target nucleic acids are DETECTED. The SARS-CoV-2 RNA is generally detectable in upper  and lower respiratory specimens during the acute phase of infection. Positive results are indicative of active infection with SARS-CoV-2. Clinical  correlation with patient history and other diagnostic information is necessary to determine patient infection status. Positive results do  not rule out bacterial infection or co-infection with other viruses. The expected result is Negative. Fact Sheet for Patients: SugarRoll.be Fact Sheet for Healthcare Providers: https://www.woods-mathews.com/ This test is not yet approved or cleared by the [place] FDA and  has been authorized for detection and/or diagnosis of SARS-CoV-2 by FDA under an Emergency Use Authorization (EUA). This EUA will remain  in effect (meaning this test can  be used) for the duration of the COVID-19 declaration under Section 564(b)(1) of the Act, 21 U.S.C. section  360bbb-3(b)(1), unless the authorization is terminated or revoked sooner. Performed at [facility], [place] [address] [place], [place] [address]          Radiology Studies: No results found.      Scheduled Meds: . amLODipine  2.5 mg Oral BID  . aspirin EC  81 mg Oral Daily  . atorvastatin  20 mg Oral Daily  . carvedilol  12.5 mg Oral Once per day on Sun Tue Thu Sat   And  . carvedilol  12.5 mg Oral QAC supper  . Chlorhexidine Gluconate Cloth  6 each Topical Q0600  . cinacalcet  60 mg Oral QODAY  . [START ON 05/22/2019] doxercalciferol  8 mcg Intravenous Q M,W,F-HD  . ferric citrate  420 mg Oral TID WC  . gabapentin  100 mg Oral QHS  . heparin  5,000 Units Subcutaneous Q8H  . insulin aspart  0-9 Units Subcutaneous TID WC  . insulin NPH Human  10 Units Subcutaneous QHS  . isosorbide-hydrALAZINE  1 tablet Oral 2 times per day on Sun Tue Thu Sat   And  . isosorbide-hydrALAZINE  1 tablet Oral QHS  . mometasone-formoterol  2 puff Inhalation BID  . multivitamin  1 tablet Oral QHS  . pantoprazole   40 mg Oral Daily   Continuous Infusions: . remdesivir 100 mg in NS 250 mL Stopped (05/19/19 2120)          Aline August, MD [facility] 05/20/2019, 7:38 AM

## 2019-05-20 NOTE — Progress Notes (Deleted)
Patient ID: Destiny Day, female   DOB: September 09, 1950, 68 y.o.   MRN: [id number]  PROGRESS NOTE    Destiny Day  [id number]:[id number] DOB: 1951-01-05 DOA: 05/17/2019 PCP: Velna Hatchet, MD   Brief Narrative:  68 year old female with history of end-stage renal on dialysis, diabetes mellitus type 2, chronic anemia, hypertension presented with fever and chills.  Chest x-ray showed infiltrates concerning for pneumonitis and COVID-19 test was positive.  CRP was 9.7, ferritin 1700 on presentation.  Assessment & Plan:   COVID-19 pneumonia Hypoxia -Chest x-ray showed infiltrates on presentation.  Currently still requiring 2 L oxygen via nasal cannula.  No temperature spikes over the last 24 hours.  Recent Labs    05/18/19 0440 05/19/19 0435 05/20/19 0350  FERRITIN 1,787* 1,645* 2,503*  LDH  --  238* 291*  CRP 9.7* 8.1* 6.9*    Lab Results  Component Value Date   SARSCOV2NAA POSITIVE (A) 05/17/2019   [place] Not Detected 05/04/2019   [place] NEGATIVE 12/20/2018    -Continue monitoring telemetry markers -Continue remdesivir for total of 5 days.  Not on steroids because of history of angioedema to prednisone  Diabetes mellitus type 2 -Continue NPH insulin along with CBGs with SSI.    Hypertension -Continue Coreg, BiDil and Norvasc  End-stage renal disease on hemodialysis--nephrology following.  Dialysis as per nephrology schedule  Hypokalemia -replace potassium.  Repeat a.m. labs  Hyperlipidemia-continue statin  Anemia of chronic disease -From chronic kidney disease.  Hemoglobin stable.  Monitor  DVT prophylaxis: Heparin Code Status: Full Family Communication: Spoke to husband on phone on 05/19/2019 Disposition Plan: Home in 2 to 4 days if clinically improves  Consultants: Nephrology  Procedures: None  Antimicrobials: None   Subjective: Patient seen and examined at bedside.  Denies worsening shortness of breath or cough.  No overnight fever or  vomiting.  Still feels weak.    Objective: Vitals:   05/19/19 1750 05/19/19 1800 05/19/19 1900 05/20/19 0500  BP: 135/60 133/62 (!) 124/94   Pulse: 68 64 71   Resp: 16 16 20    Temp: 98.2 F (36.8 C)  99.1 F (37.3 C)   TempSrc: Oral  Oral   SpO2:   100%   Weight: 62 kg   64.2 kg  Height:        Intake/Output Summary (Last 24 hours) at 05/20/2019 0734 Last data filed at 05/20/2019 0600 Gross per 24 hour  Intake 484.83 ml  Output 2700 ml  Net -2215.17 ml   Filed Weights   05/19/19 1420 05/19/19 1750 05/20/19 0500  Weight: 64.5 kg 62 kg 64.2 kg    Examination:  General exam: No distress. Respiratory system: Bilateral decreased breath sounds at bases.  No wheezing some crackles heard  cardiovascular system: Rate controlled, S1-S2 heard Gastrointestinal system: Abdomen is nondistended, soft and nontender. Normal bowel sounds heard. Extremities: No cyanosis; trace edema   Data Reviewed: I have personally reviewed following labs and imaging studies  CBC: Recent Labs  Lab 05/17/19 1936 05/18/19 0348 05/19/19 0435 05/20/19 0350  WBC 4.8 4.1 6.3 6.4  NEUTROABS 3.7 2.4 3.8 2.9  HGB 10.3* 10.4* 9.2* 10.5*  HCT 32.1* 32.3* 29.5* 33.3*  MCV 96.7 96.1 98.0 97.4  PLT 175 178 187 570   Basic Metabolic Panel: Recent Labs  Lab 05/17/19 1936 05/17/19 2043 05/18/19 0348 05/19/19 0435 05/20/19 0350  NA 137  --  137 137 137  K 2.2*  --  2.8* 2.6* 2.9*  CL 94*  --  94* 94*  97*  CO2 30  --  29 28 27   GLUCOSE 189*  --  142* 82 56*  BUN 9  --  13 23 11   CREATININE 3.31*  --  3.98* 6.23* 3.82*  CALCIUM 8.0*  --  8.0* 6.9* 7.3*  MG  --  1.8  --  1.8  --    GFR: Estimated Creatinine Clearance: 12.7 mL/min (A) (by C-G formula based on SCr of 3.82 mg/dL (H)). Liver Function Tests: Recent Labs  Lab 05/17/19 1936 05/18/19 0348  AST 35 40  ALT 17 16  ALKPHOS 138* 129*  BILITOT 0.9 1.1  PROT 6.8 6.6  ALBUMIN 3.0* 2.8*   No results for input(s): LIPASE, AMYLASE in the  last 168 hours. No results for input(s): AMMONIA in the last 168 hours. Coagulation Profile: Recent Labs  Lab 05/17/19 1936  INR 1.1   Cardiac Enzymes: No results for input(s): CKTOTAL, CKMB, CKMBINDEX, TROPONINI in the last 168 hours. BNP (last 3 results) No results for input(s): PROBNP in the last 8760 hours. HbA1C: No results for input(s): HGBA1C in the last 72 hours. CBG: Recent Labs  Lab 05/18/19 2203 05/19/19 0815 05/19/19 1308 05/19/19 1822 05/19/19 2046  GLUCAP 140* 72 122* 89 154*   Lipid Profile: No results for input(s): CHOL, HDL, LDLCALC, TRIG, CHOLHDL, LDLDIRECT in the last 72 hours. Thyroid Function Tests: No results for input(s): TSH, T4TOTAL, FREET4, T3FREE, THYROIDAB in the last 72 hours. Anemia Panel: Recent Labs    05/19/19 0435 05/20/19 0350  FERRITIN 1,645* 2,503*   Sepsis Labs: Recent Labs  Lab 05/17/19 1936  LATICACIDVEN 1.6    Recent Results (from the past 240 hour(s))  Blood Culture (routine x 2)     Status: None (Preliminary result)   Collection Time: 05/17/19  7:40 PM   Specimen: BLOOD RIGHT HAND  Result Value Ref Range Status   Specimen Description BLOOD RIGHT HAND  Final   Special Requests   Final    BOTTLES DRAWN AEROBIC AND ANAEROBIC Blood Culture adequate volume   Culture   Final    NO GROWTH 3 DAYS Performed at [facility], [place] [address] [place], [place] [address]    Report Status PENDING  Incomplete  SARS CORONAVIRUS 2 (TAT 6-24 HRS) Nasopharyngeal Nasopharyngeal Swab     Status: Abnormal   Collection Time: 05/17/19  7:40 PM   Specimen: Nasopharyngeal Swab  Result Value Ref Range Status   SARS Coronavirus 2 POSITIVE (A) NEGATIVE Final    Comment: RESULT CALLED TO, READ BACK BY AND VERIFIED WITH: RIEARY A, RN AT 0040 ON 05/18/2019 BY SAINVILUS S (NOTE) SARS-CoV-2 target nucleic acids are DETECTED. The SARS-CoV-2 RNA is generally detectable in upper and lower respiratory specimens during the acute phase of  infection. Positive results are indicative of active infection with SARS-CoV-2. Clinical  correlation with patient history and other diagnostic information is necessary to determine patient infection status. Positive results do  not rule out bacterial infection or co-infection with other viruses. The expected result is Negative. Fact Sheet for Patients: SugarRoll.be Fact Sheet for Healthcare Providers: https://www.woods-mathews.com/ This test is not yet approved or cleared by the [place] FDA and  has been authorized for detection and/or diagnosis of SARS-CoV-2 by FDA under an Emergency Use Authorization (EUA). This EUA will remain  in effect (meaning this test can  be used) for the duration of the COVID-19 declaration under Section 564(b)(1) of the Act, 21 U.S.C. section 360bbb-3(b)(1), unless the authorization is terminated or  revoked sooner. Performed at [facility], [place] [address] [place], [place] [address]          Radiology Studies: No results found.      Scheduled Meds: . amLODipine  2.5 mg Oral BID  . aspirin EC  81 mg Oral Daily  . atorvastatin  20 mg Oral Daily  . carvedilol  12.5 mg Oral Once per day on Sun Tue Thu Sat   And  . carvedilol  12.5 mg Oral QAC supper  . Chlorhexidine Gluconate Cloth  6 each Topical Q0600  . cinacalcet  60 mg Oral QODAY  . [START ON 05/22/2019] doxercalciferol  8 mcg Intravenous Q M,W,F-HD  . ferric citrate  420 mg Oral TID WC  . gabapentin  100 mg Oral QHS  . heparin  5,000 Units Subcutaneous Q8H  . insulin aspart  0-9 Units Subcutaneous TID WC  . insulin NPH Human  10 Units Subcutaneous QHS  . isosorbide-hydrALAZINE  1 tablet Oral 2 times per day on Sun Tue Thu Sat   And  . isosorbide-hydrALAZINE  1 tablet Oral QHS  . mometasone-formoterol  2 puff Inhalation BID  . multivitamin  1 tablet Oral QHS  . pantoprazole  40 mg Oral Daily   Continuous Infusions: . remdesivir  100 mg in NS 250 mL Stopped (05/19/19 2120)          Aline August, MD [facility] 05/20/2019, 7:34 AM

## 2019-05-20 NOTE — Progress Notes (Signed)
[facility] Progress Note   Home meds: - amlodipine 10/ carvedilol / isosorbide-hydralazine - insulin NPH 10hs/ regular 3- 8u SSI - cinacalcet qd/ ferric citrate ac - aspirin 81/ atrovastatin 20 - gabapentin 100hs/ diazepam 5 bid prn/ oxy-acetaminophen qid prn - omeprazole 40/ symbicort qd prn - prn's/ vitamins/ supplements   Outpt QP:RFFMB MWf  4h 67kg 2/2.25 bath AVG Hep 2500 w/ 1020midrun - hect 8ug - mircera 50ug q4wk, last 10/14 - venofer 100 x 5, one left Assessment/ Plan:   1. COVID+ PNA - on 2L [place], +febrile 2. ESRD - on HD MWF. Had OP HD Wed. Last HD 10/23 UF net 2.7L. Next HD planned for Mon to continue outpt regimen. No acute indication for RRT today  3. HTN/ Vol - BP's low normal, lower norvasc.  4. 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 5. DM on insulin 6. Anemia ckd - last esa on 10/14, next due on 10/28  Subjective:   Patient not examined directly given COVID-19 + status, utilizing exam of the primary team and observations of RN's.    Objective:   BP (!) 181/73 (BP Location: Right Arm)   Pulse 77   Temp 98.6 F (37 C) (Oral)   Resp 17   Ht 5\' 5"  (1.651 m)   Wt 64.2 kg   SpO2 96%   BMI 23.55 kg/m   Intake/Output Summary (Last 24 hours) at 05/20/2019 1440 Last data filed at 05/20/2019 0930 Gross per 24 hour  Intake 724.83 ml  Output 2700 ml  Net -1975.17 ml   Weight change: 0 kg  Physical Exam: Patient not examined directly given COVID-19 + status, utilizing exam of the primary team and observations of RN's.   Imaging: No results found.  Labs: BMET Recent Labs  Lab 05/17/19 1936 05/18/19 0348 05/19/19 0435 05/20/19 0350  NA 137 137 137 137  K 2.2* 2.8* 2.6* 2.9*  CL 94* 94* 94* 97*  CO2 30 29 28 27   GLUCOSE 189* 142* 82 56*  BUN 9 13 23 11   CREATININE 3.31* 3.98* 6.23* 3.82*  CALCIUM 8.0* 8.0* 6.9* 7.3*   CBC Recent Labs  Lab 05/17/19 1936 05/18/19 0348  05/19/19 0435 05/20/19 0350  WBC 4.8 4.1 6.3 6.4  NEUTROABS 3.7 2.4 3.8 2.9  HGB 10.3* 10.4* 9.2* 10.5*  HCT 32.1* 32.3* 29.5* 33.3*  MCV 96.7 96.1 98.0 97.4  PLT 175 178 187 209    Medications:    . amLODipine  2.5 mg Oral BID  . aspirin EC  81 mg Oral Daily  . atorvastatin  20 mg Oral Daily  . carvedilol  12.5 mg Oral Once per day on Sun Tue Thu Sat   And  . carvedilol  12.5 mg Oral QAC supper  . Chlorhexidine Gluconate Cloth  6 each Topical Q0600  . cinacalcet  60 mg Oral QODAY  . [START ON 05/22/2019] doxercalciferol  8 mcg Intravenous Q M,W,F-HD  . ferric citrate  420 mg Oral TID WC  . gabapentin  100 mg Oral QHS  . heparin  5,000 Units Subcutaneous Q8H  . insulin aspart  0-9 Units Subcutaneous TID WC  . insulin NPH Human  10 Units Subcutaneous QHS  . isosorbide-hydrALAZINE  1 tablet Oral 2 times per day on Sun Tue Thu Sat   And  . isosorbide-hydrALAZINE  1 tablet Oral QHS  . mometasone-formoterol  2 puff Inhalation BID  . multivitamin  1 tablet Oral QHS  . pantoprazole  40 mg Oral Daily      Otelia Santee, MD 05/20/2019, 2:40 PM

## 2019-05-21 LAB — CBC WITH DIFFERENTIAL/PLATELET
Abs Immature Granulocytes: 0.36 10*3/uL — ABNORMAL HIGH (ref 0.00–0.07)
Basophils Absolute: 0 10*3/uL (ref 0.0–0.1)
Basophils Relative: 1 %
Eosinophils Absolute: 0.3 10*3/uL (ref 0.0–0.5)
Eosinophils Relative: 4 %
HCT: 32 % — ABNORMAL LOW (ref 36.0–46.0)
Hemoglobin: 9.9 g/dL — ABNORMAL LOW (ref 12.0–15.0)
Immature Granulocytes: 5 %
Lymphocytes Relative: 39 %
Lymphs Abs: 2.6 10*3/uL (ref 0.7–4.0)
MCH: 30.5 pg (ref 26.0–34.0)
MCHC: 30.9 g/dL (ref 30.0–36.0)
MCV: 98.5 fL (ref 80.0–100.0)
Monocytes Absolute: 0.4 10*3/uL (ref 0.1–1.0)
Monocytes Relative: 6 %
Neutro Abs: 3 10*3/uL (ref 1.7–7.7)
Neutrophils Relative %: 45 %
Platelets: 240 10*3/uL (ref 150–400)
RBC: 3.25 MIL/uL — ABNORMAL LOW (ref 3.87–5.11)
RDW: 13.7 % (ref 11.5–15.5)
WBC: 6.7 10*3/uL (ref 4.0–10.5)
nRBC: 0.4 % — ABNORMAL HIGH (ref 0.0–0.2)

## 2019-05-21 LAB — BASIC METABOLIC PANEL
Anion gap: 13 (ref 5–15)
BUN: 25 mg/dL — ABNORMAL HIGH (ref 8–23)
CO2: 26 mmol/L (ref 22–32)
Calcium: 7 mg/dL — ABNORMAL LOW (ref 8.9–10.3)
Chloride: 97 mmol/L — ABNORMAL LOW (ref 98–111)
Creatinine, Ser: 5.69 mg/dL — ABNORMAL HIGH (ref 0.44–1.00)
GFR calc Af Amer: 8 mL/min — ABNORMAL LOW (ref >60)
GFR calc non Af Amer: 7 mL/min — ABNORMAL LOW (ref >60)
Glucose, Bld: 57 mg/dL — ABNORMAL LOW (ref 70–99)
Potassium: 3.6 mmol/L (ref 3.5–5.1)
Sodium: 136 mmol/L (ref 135–145)

## 2019-05-21 LAB — PHOSPHORUS: Phosphorus: 1.6 mg/dL — ABNORMAL LOW (ref 2.5–4.6)

## 2019-05-21 LAB — LACTATE DEHYDROGENASE: LDH: 286 U/L — ABNORMAL HIGH (ref 98–192)

## 2019-05-21 LAB — GLUCOSE, CAPILLARY
Glucose-Capillary: 62 mg/dL — ABNORMAL LOW (ref 70–99)
Glucose-Capillary: 121 mg/dL — ABNORMAL HIGH (ref 70–99)
Glucose-Capillary: 64 mg/dL — ABNORMAL LOW (ref 70–99)
Glucose-Capillary: 67 mg/dL — ABNORMAL LOW (ref 70–99)
Glucose-Capillary: 204 mg/dL — ABNORMAL HIGH (ref 70–99)
Glucose-Capillary: 142 mg/dL — ABNORMAL HIGH (ref 70–99)
Glucose-Capillary: 153 mg/dL — ABNORMAL HIGH (ref 70–99)

## 2019-05-21 LAB — C-REACTIVE PROTEIN: CRP: 5 mg/dL — ABNORMAL HIGH (ref <1.0)

## 2019-05-21 LAB — FERRITIN: Ferritin: 1890 ng/mL — ABNORMAL HIGH (ref 11–307)

## 2019-05-21 MED ORDER — DEXTROSE 50 % IV SOLN
12.5000 g | Freq: Once | INTRAVENOUS | Status: AC
  Administered 2019-05-21: 12.5 g via INTRAVENOUS

## 2019-05-21 MED ORDER — INSULIN NPH (HUMAN) (ISOPHANE) 100 UNIT/ML ~~LOC~~ SUSP: 5.0000 [IU] | Freq: Every day | SUBCUTANEOUS | Status: DC

## 2019-05-21 MED ORDER — DIAZEPAM 2 MG PO TABS
2.0000 mg | ORAL_TABLET | Freq: Two times a day (BID) | ORAL | Status: DC | PRN
  Administered 2019-05-21: 2 mg via ORAL
  Filled 2019-05-21: qty 1

## 2019-05-21 NOTE — Progress Notes (Signed)
[facility] Progress Note   Home meds: - amlodipine 10/ carvedilol / isosorbide-hydralazine - insulin NPH 10hs/ regular 3- 8u SSI - cinacalcet qd/ ferric citrate ac - aspirin 81/ atrovastatin 20 - gabapentin 100hs/ diazepam 5 bid prn/ oxy-acetaminophen qid prn - omeprazole 40/ symbicort qd prn - prn's/ vitamins/ supplements   Outpt YH:CWCBJ MWf  4h 67kg 2/2.25 bath AVG Hep 2500 w/ 1019midrun - hect 8ug - mircera 50ug q4wk, last 10/14 - venofer 100 x 5, one left Assessment/ Plan:   1. COVID+ PNA- on 2L [place] 2. ESRD - on HD MWF. Had OP HDWed. Last HD 10/23 UF net 2.7L. Next HD planned for Mon to continue outpt regimen. No acute indication for RRT today. Good bruit in left arm AVG. 3. HTN/ Vol - BP'slow normal, lower norvasc.  4. 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 5. DM on insulin 6. Anemia ckd - last esa on 10/14, next due on 10/28  Subjective:   Diaphoretic this AM bec of low FS. Feeling a little better now   Objective:   BP (!) 110/51 (BP Location: Left Leg)   Pulse (!) 53   Temp 98.6 F (37 C) (Oral)   Resp 18   Ht 5\' 5"  (1.651 m)   Wt 64.2 kg   SpO2 96%   BMI 23.55 kg/m   Intake/Output Summary (Last 24 hours) at 05/21/2019 6283 Last data filed at 05/21/2019 1517 Gross per 24 hour  Intake 358 ml  Output -  Net 358 ml   Weight change:   Physical Exam: GEN: NAD, A&Ox3, NCAT HEENT: No conjunctival pallor, EOMI NECK: Supple, no thyromegaly LUNGS: CTA B/L but poor air movement, not tachypneic CV: RRR, No M/R/G ABD: SNDNT +BS  EXT: No lower extremity edema ACCESS: LUA AVG good bruit   Imaging: No results found.  Labs: BMET Recent Labs  Lab 05/17/19 1936 05/18/19 0348 05/19/19 0435 05/20/19 0350 05/21/19 0425  NA 137 137 137 137 136  K 2.2* 2.8* 2.6* 2.9* 3.6  CL 94* 94* 94* 97* 97*  CO2 30 29 28 27 26   GLUCOSE 189* 142* 82 56* 57*  BUN 9 13 23 11  25*  CREATININE 3.31*  3.98* 6.23* 3.82* 5.69*  CALCIUM 8.0* 8.0* 6.9* 7.3* 7.0*  PHOS  --   --   --   --  1.6*   CBC Recent Labs  Lab 05/18/19 0348 05/19/19 0435 05/20/19 0350 05/21/19 0425  WBC 4.1 6.3 6.4 6.7  NEUTROABS 2.4 3.8 2.9 3.0  HGB 10.4* 9.2* 10.5* 9.9*  HCT 32.3* 29.5* 33.3* 32.0*  MCV 96.1 98.0 97.4 98.5  PLT 178 187 209 240    Medications:    . amLODipine  2.5 mg Oral BID  . aspirin EC  81 mg Oral Daily  . atorvastatin  20 mg Oral Daily  . carvedilol  12.5 mg Oral Once per day on Sun Tue Thu Sat   And  . carvedilol  12.5 mg Oral QAC supper  . Chlorhexidine Gluconate Cloth  6 each Topical Q0600  . cinacalcet  60 mg Oral QODAY  . [START ON 05/22/2019] doxercalciferol  8 mcg Intravenous Q M,W,F-HD  . ferric citrate  420 mg Oral TID WC  . gabapentin  100 mg Oral QHS  . heparin  5,000 Units Subcutaneous Q8H  . insulin aspart  0-9 Units Subcutaneous TID WC  . insulin NPH Human  5 Units Subcutaneous QHS  . isosorbide-hydrALAZINE  1 tablet Oral  2 times per day on Sun Tue Thu Sat   And  . isosorbide-hydrALAZINE  1 tablet Oral QHS  . mometasone-formoterol  2 puff Inhalation BID  . multivitamin  1 tablet Oral QHS  . pantoprazole  40 mg Oral Daily      Otelia Santee, MD 05/21/2019, 9:21 AM

## 2019-05-21 NOTE — Progress Notes (Signed)
Patient ID: Destiny Day, female   DOB: 07/22/1951, 68 y.o.   MRN: [id number]  PROGRESS NOTE    Destiny Day  PZW:[id number] DOB: Jul 15, 1951 DOA: 05/17/2019 PCP: Velna Hatchet, MD   Brief Narrative:  68 year old female with history of end-stage renal on dialysis, diabetes mellitus type 2, chronic anemia, hypertension presented with fever and chills.  Chest x-ray showed infiltrates concerning for pneumonitis and COVID-19 test was positive.  CRP was 9.7, ferritin 1700 on presentation. Nephrology was consulted.  Assessment & Plan:   COVID-19 pneumonia Hypoxia -Chest x-ray showed infiltrates on presentation.  Currently still requiring 2 L oxygen via nasal cannula.  No temperature spikes over the last 24 hours.   Recent Labs    05/19/19 0435 05/20/19 0350 05/21/19 0425  FERRITIN 1,645* 2,503* 1,890*  LDH 238* 291* 286*  CRP 8.1* 6.9* 5.0*    Lab Results  Component Value Date   SARSCOV2NAA POSITIVE (A) 05/17/2019   [place] Not Detected 05/04/2019   [place] NEGATIVE 12/20/2018   -Continue monitoring telemetry markers: improving -Continue remdesivir for total of 5 days; Day# 4 today.  Not on steroids because of history of angioedema to prednisone -Complains of increasing cough.  Diabetes mellitus type 2 with hypoglycemia -Hypoglycemic this morning.  DC NPH insulin.; continue CBGs with SSI.    Hypertension -Continue Coreg, BiDil and Norvasc  End-stage renal disease on hemodialysis--nephrology following.  Dialysis as per nephrology schedule  Hypokalemia -Improved  Hyperlipidemia-continue statin  Anemia of chronic disease -From chronic kidney disease.  Hemoglobin stable.  Monitor  DVT prophylaxis: Heparin Code Status: Full Family Communication: Spoke to husband on phone on 05/19/2019 Disposition Plan: Home in 1-3 days if clinically improves  Consultants: Nephrology  Procedures: None  Antimicrobials: None   Subjective: Patient seen and  examined at bedside.  Slightly drowsy this morning.  Nursing staff reports low blood sugars.  No worsening cough or fever reported.   Objective: Vitals:   05/20/19 0922 05/20/19 1748 05/20/19 1749 05/20/19 2119  BP: (!) 181/73 137/77  138/65  Pulse: 77 71  67  Resp: 17 16  18   Temp: 98.6 F (37 C) 98.6 F (37 C)  98.6 F (37 C)  TempSrc: Oral Oral  Oral  SpO2: 96% 100% 96% 99%  Weight:      Height:        Intake/Output Summary (Last 24 hours) at 05/21/2019 0730 Last data filed at 05/20/2019 0930 Gross per 24 hour  Intake 240 ml  Output -  Net 240 ml   Filed Weights   05/19/19 1420 05/19/19 1750 05/20/19 0500  Weight: 64.5 kg 62 kg 64.2 kg    Examination:  General exam: No acute distress.  Slightly drowsy this morning. Respiratory system: Bilateral decreased breath sounds at bases with some crackels cardiovascular system: S1-S2 heard; rate controlled Gastrointestinal system: Abdomen is nondistended, soft and nontender. Normal bowel sounds heard. Extremities: No cyanosis; trace edema   Data Reviewed: I have personally reviewed following labs and imaging studies  CBC: Recent Labs  Lab 05/17/19 1936 05/18/19 0348 05/19/19 0435 05/20/19 0350 05/21/19 0425  WBC 4.8 4.1 6.3 6.4 6.7  NEUTROABS 3.7 2.4 3.8 2.9 3.0  HGB 10.3* 10.4* 9.2* 10.5* 9.9*  HCT 32.1* 32.3* 29.5* 33.3* 32.0*  MCV 96.7 96.1 98.0 97.4 98.5  PLT 175 178 187 209 423   Basic Metabolic Panel: Recent Labs  Lab 05/17/19 1936 05/17/19 2043 05/18/19 0348 05/19/19 0435 05/20/19 0350 05/21/19 0425  NA 137  --  137  137 137 136  K 2.2*  --  2.8* 2.6* 2.9* 3.6  CL 94*  --  94* 94* 97* 97*  CO2 30  --  29 28 27 26   GLUCOSE 189*  --  142* 82 56* 57*  BUN 9  --  13 23 11  25*  CREATININE 3.31*  --  3.98* 6.23* 3.82* 5.69*  CALCIUM 8.0*  --  8.0* 6.9* 7.3* 7.0*  MG  --  1.8  --  1.8  --   --   PHOS  --   --   --   --   --  1.6*   GFR: Estimated Creatinine Clearance: 8.5 mL/min (A) (by C-G formula  based on SCr of 5.69 mg/dL (H)). Liver Function Tests: Recent Labs  Lab 05/17/19 1936 05/18/19 0348  AST 35 40  ALT 17 16  ALKPHOS 138* 129*  BILITOT 0.9 1.1  PROT 6.8 6.6  ALBUMIN 3.0* 2.8*   No results for input(s): LIPASE, AMYLASE in the last 168 hours. No results for input(s): AMMONIA in the last 168 hours. Coagulation Profile: Recent Labs  Lab 05/17/19 1936  INR 1.1   Cardiac Enzymes: No results for input(s): CKTOTAL, CKMB, CKMBINDEX, TROPONINI in the last 168 hours. BNP (last 3 results) No results for input(s): PROBNP in the last 8760 hours. HbA1C: No results for input(s): HGBA1C in the last 72 hours. CBG: Recent Labs  Lab 05/20/19 0916 05/20/19 1221 05/20/19 1311 05/20/19 1745 05/20/19 2114  GLUCAP 73 68* 83 84 139*   Lipid Profile: No results for input(s): CHOL, HDL, LDLCALC, TRIG, CHOLHDL, LDLDIRECT in the last 72 hours. Thyroid Function Tests: No results for input(s): TSH, T4TOTAL, FREET4, T3FREE, THYROIDAB in the last 72 hours. Anemia Panel: Recent Labs    05/20/19 0350 05/21/19 0425  FERRITIN 2,503* 1,890*   Sepsis Labs: Recent Labs  Lab 05/17/19 1936  LATICACIDVEN 1.6    Recent Results (from the past 240 hour(s))  Blood Culture (routine x 2)     Status: None (Preliminary result)   Collection Time: 05/17/19  7:40 PM   Specimen: BLOOD RIGHT HAND  Result Value Ref Range Status   Specimen Description BLOOD RIGHT HAND  Final   Special Requests   Final    BOTTLES DRAWN AEROBIC AND ANAEROBIC Blood Culture adequate volume   Culture   Final    NO GROWTH 3 DAYS Performed at [facility], [place] [address] [place], [place] [address]    Report Status PENDING  Incomplete  SARS CORONAVIRUS 2 (TAT 6-24 HRS) Nasopharyngeal Nasopharyngeal Swab     Status: Abnormal   Collection Time: 05/17/19  7:40 PM   Specimen: Nasopharyngeal Swab  Result Value Ref Range Status   SARS Coronavirus 2 POSITIVE (A) NEGATIVE Final    Comment: RESULT CALLED TO, READ  BACK BY AND VERIFIED WITH: RIEARY A, RN AT 0040 ON 05/18/2019 BY SAINVILUS S (NOTE) SARS-CoV-2 target nucleic acids are DETECTED. The SARS-CoV-2 RNA is generally detectable in upper and lower respiratory specimens during the acute phase of infection. Positive results are indicative of active infection with SARS-CoV-2. Clinical  correlation with patient history and other diagnostic information is necessary to determine patient infection status. Positive results do  not rule out bacterial infection or co-infection with other viruses. The expected result is Negative. Fact Sheet for Patients: SugarRoll.be Fact Sheet for Healthcare Providers: https://www.woods-mathews.com/ This test is not yet approved or cleared by the [place] FDA and  has been authorized for detection and/or  diagnosis of SARS-CoV-2 by FDA under an Emergency Use Authorization (EUA). This EUA will remain  in effect (meaning this test can  be used) for the duration of the COVID-19 declaration under Section 564(b)(1) of the Act, 21 U.S.C. section 360bbb-3(b)(1), unless the authorization is terminated or revoked sooner. Performed at [facility], [place] [address] [place], [place] [address]          Radiology Studies: No results found.      Scheduled Meds: . amLODipine  2.5 mg Oral BID  . aspirin EC  81 mg Oral Daily  . atorvastatin  20 mg Oral Daily  . carvedilol  12.5 mg Oral Once per day on Sun Tue Thu Sat   And  . carvedilol  12.5 mg Oral QAC supper  . Chlorhexidine Gluconate Cloth  6 each Topical Q0600  . cinacalcet  60 mg Oral QODAY  . [START ON 05/22/2019] doxercalciferol  8 mcg Intravenous Q M,W,F-HD  . ferric citrate  420 mg Oral TID WC  . gabapentin  100 mg Oral QHS  . heparin  5,000 Units Subcutaneous Q8H  . insulin aspart  0-9 Units Subcutaneous TID WC  . insulin NPH Human  10 Units Subcutaneous QHS  . isosorbide-hydrALAZINE  1 tablet Oral 2  times per day on Sun Tue Thu Sat   And  . isosorbide-hydrALAZINE  1 tablet Oral QHS  . mometasone-formoterol  2 puff Inhalation BID  . multivitamin  1 tablet Oral QHS  . pantoprazole  40 mg Oral Daily   Continuous Infusions: . remdesivir 100 mg in NS 250 mL Stopped (05/20/19 2155)          Aline August, MD [facility] 05/21/2019, 7:30 AM

## 2019-05-22 LAB — CBC WITH DIFFERENTIAL/PLATELET
Abs Immature Granulocytes: 0 10*3/uL (ref 0.00–0.07)
Basophils Absolute: 0.1 10*3/uL (ref 0.0–0.1)
Basophils Relative: 1 %
Eosinophils Absolute: 0.2 10*3/uL (ref 0.0–0.5)
Eosinophils Relative: 3 %
HCT: 29.3 % — ABNORMAL LOW (ref 36.0–46.0)
Hemoglobin: 9.4 g/dL — ABNORMAL LOW (ref 12.0–15.0)
Lymphocytes Relative: 23 %
Lymphs Abs: 1.7 10*3/uL (ref 0.7–4.0)
MCH: 30.9 pg (ref 26.0–34.0)
MCHC: 32.1 g/dL (ref 30.0–36.0)
MCV: 96.4 fL (ref 80.0–100.0)
Monocytes Absolute: 0.6 10*3/uL (ref 0.1–1.0)
Monocytes Relative: 8 %
Neutro Abs: 4.8 10*3/uL (ref 1.7–7.7)
Neutrophils Relative %: 65 %
Platelets: 269 10*3/uL (ref 150–400)
RBC: 3.04 MIL/uL — ABNORMAL LOW (ref 3.87–5.11)
RDW: 13.8 % (ref 11.5–15.5)
WBC: 7.4 10*3/uL (ref 4.0–10.5)
nRBC: 0 /100 WBC
nRBC: 0.3 % — ABNORMAL HIGH (ref 0.0–0.2)

## 2019-05-22 LAB — COMPREHENSIVE METABOLIC PANEL
ALT: 20 U/L (ref 0–44)
AST: 31 U/L (ref 15–41)
Albumin: 2.6 g/dL — ABNORMAL LOW (ref 3.5–5.0)
Alkaline Phosphatase: 142 U/L — ABNORMAL HIGH (ref 38–126)
Anion gap: 15 (ref 5–15)
BUN: 45 mg/dL — ABNORMAL HIGH (ref 8–23)
CO2: 23 mmol/L (ref 22–32)
Calcium: 6.9 mg/dL — ABNORMAL LOW (ref 8.9–10.3)
Chloride: 95 mmol/L — ABNORMAL LOW (ref 98–111)
Creatinine, Ser: 7.55 mg/dL — ABNORMAL HIGH (ref 0.44–1.00)
GFR calc Af Amer: 6 mL/min — ABNORMAL LOW (ref >60)
GFR calc non Af Amer: 5 mL/min — ABNORMAL LOW (ref >60)
Glucose, Bld: 102 mg/dL — ABNORMAL HIGH (ref 70–99)
Potassium: 3.6 mmol/L (ref 3.5–5.1)
Sodium: 133 mmol/L — ABNORMAL LOW (ref 135–145)
Total Bilirubin: 0.4 mg/dL (ref 0.3–1.2)
Total Protein: 5.8 g/dL — ABNORMAL LOW (ref 6.5–8.1)

## 2019-05-22 LAB — GLUCOSE, CAPILLARY
Glucose-Capillary: 99 mg/dL (ref 70–99)
Glucose-Capillary: 118 mg/dL — ABNORMAL HIGH (ref 70–99)
Glucose-Capillary: 138 mg/dL — ABNORMAL HIGH (ref 70–99)

## 2019-05-22 LAB — CULTURE, BLOOD (ROUTINE X 2)
Culture: NO GROWTH
Special Requests: ADEQUATE

## 2019-05-22 LAB — LACTATE DEHYDROGENASE: LDH: 273 U/L — ABNORMAL HIGH (ref 98–192)

## 2019-05-22 LAB — C-REACTIVE PROTEIN: CRP: 3.2 mg/dL — ABNORMAL HIGH (ref <1.0)

## 2019-05-22 LAB — FERRITIN: Ferritin: 1708 ng/mL — ABNORMAL HIGH (ref 11–307)

## 2019-05-22 MED ORDER — DOXERCALCIFEROL 4 MCG/2ML IV SOLN
INTRAVENOUS | Status: AC
  Filled 2019-05-22: qty 4

## 2019-05-22 MED ORDER — HYDROCOD POLST-CPM POLST ER 10-8 MG/5ML PO SUER: 5.0000 mL | Freq: Two times a day (BID) | ORAL | 0 refills | Status: AC | PRN

## 2019-05-22 MED ORDER — ONDANSETRON HCL 4 MG PO TABS: 4.0000 mg | ORAL_TABLET | Freq: Four times a day (QID) | ORAL | 0 refills | Status: AC | PRN

## 2019-05-22 NOTE — Progress Notes (Signed)
Patient hand discharge papers. PTAR scheduled for 7pm. Patient last dose of remdesivir given at  1748. Patient reports no further questions about discharged. Pt awaiting transport to home.

## 2019-05-22 NOTE — Discharge Summary (Signed)
Physician Discharge Summary  Destiny Day QJJ:[id number] DOB: 1950-09-29 DOA: 05/17/2019  PCP: Velna Hatchet, MD  Admit date: 05/17/2019 Discharge date: 05/22/2019  Admitted From: Home Disposition: Home  Recommendations for Outpatient Follow-up:  1. Follow up with PCP in 1 week 2. Outpatient follow-up with dialysis as scheduled 3. Follow up in ED if symptoms worsen or new appear   Home Health: No Equipment/Devices: None  Discharge Condition: Stable CODE STATUS: Full Diet recommendation: Heart healthy/carb modified/renal hemodialysis diet  Brief/Interim Summary: 68 year old female with history of end-stage renal on dialysis, diabetes mellitus type 2, chronic anemia, hypertension presented with fever and chills.  Chest x-ray showed infiltrates concerning for pneumonitis and COVID-19 test was positive.  CRP was 9.7, ferritin 1700 on presentation. Nephrology was consulted.  During the hospitalization, she required supplemental oxygen.  She was started on remdesivir and steroids could not be started because of history of angioedema to prednisone.  She is tolerating hemodialysis.  Her symptoms are improving.  Today is day #5 of remdesivir.  If after dialysis, she remains on room air, she will be discharged home to complete home quarantine.  Discharge Diagnoses:   COVID-19 pneumonia Hypoxia -Chest x-ray showed infiltrates on presentation.   No temperature spikes over the last 24 hours.   Recent Labs (last 2 labs)        Recent Labs    05/19/19 0435 05/20/19 0350 05/21/19 0425  FERRITIN 1,645* 2,503* 1,890*  LDH 238* 291* 286*  CRP 8.1* 6.9* 5.0*      Recent Labs       Lab Results  Component Value Date   SARSCOV2NAA POSITIVE (A) 05/17/2019   [place] Not Detected 05/04/2019   [place] NEGATIVE 12/20/2018     -Inflammatory markers improving. -Today's day #5 of remdesivir.  Not on steroids because of history of angioedema to prednisone -Has required  oxygen via nasal cannula at 2 L/min intermittently. -If patient comes off oxygen today after dialysis, will discharge patient home.  Patient will need to complete 2 weeks of home quarantine.  Diabetes mellitus type 2 with hypoglycemia -Has had episodes of hypoglycemia in the hospital.  Will DC NPH insulin on discharge.  Carb modified diet.  Outpatient follow-up  Hypertension -Continue home regimen on discharge.  End-stage renal disease on hemodialysis--nephrology following.  Dialysis as per nephrology schedule.  Outpatient follow-up with dialysis as scheduled.  Hypokalemia -Improved  Hyperlipidemia-continue statin  Anemia of chronic disease -From chronic kidney disease.  Hemoglobin stable.    Outpatient follow-up.   Discharge Instructions  Discharge Instructions    MyChart COVID-19 home monitoring program   Complete by: May 22, 2019    Is the patient willing to use the [facility] for home monitoring?: Yes   Temperature monitoring   Complete by: May 22, 2019    After how many days would you like to receive a notification of this patient's flowsheet entries?: 1     Allergies as of 05/22/2019      Reactions   Adhesive  [tape] Hives   Prednisone Swelling, Other (See Comments)   Excessive fluid buildup   Lisinopril Cough      Medication List    STOP taking these medications   guaiFENesin 100 MG/5ML liquid Commonly known as: ROBITUSSIN   insulin NPH Human 100 UNIT/ML injection Commonly known as: NOVOLIN N   oxyCODONE-acetaminophen 5-325 MG tablet Commonly known as: PERCOCET/ROXICET   silver sulfADIAZINE 1 % cream Commonly known as: SILVADENE     TAKE these medications  albuterol 108 (90 Base) MCG/ACT inhaler Commonly known as: VENTOLIN HFA Inhale 1-2 puffs into the lungs every 6 (six) hours as needed for wheezing or shortness of breath.   amLODipine 10 MG tablet Commonly known as: NORVASC Take 10 mg by mouth at bedtime.   aspirin EC 81 MG  tablet Take 81 mg by mouth daily. What changed: Another medication with the same name was removed. Continue taking this medication, and follow the directions you see here.   atorvastatin 20 MG tablet Commonly known as: LIPITOR TAKE 1 TABLET BY MOUTH EVERY DAY What changed: when to take this   Auryxia 1 GM 210 MG(Fe) tablet Generic drug: ferric citrate Take 210-420 mg by mouth See admin instructions. Take 2 tablets (420 mg) by mouth with each meal & take 1 tablet (210 mg) by mouth with snacks.   budesonide-formoterol 160-4.5 MCG/ACT inhaler Commonly known as: SYMBICORT Inhale 1 puff into the lungs daily as needed (respiratory issues.).   carvedilol 12.5 MG tablet Commonly known as: COREG Take 12.5 mg by mouth See admin instructions. Take 1 tablet (12.5 mg) by mouth once daily in the evening on Mondays, Wednesdays, & Fridays. (Dialysis) Take 1 tablet (12.5 mg) by mouth twice daily on Sundays, Tuesdays, Thursdays, & Saturdays. (Non Dialysis)   chlorpheniramine-HYDROcodone 10-8 MG/5ML Suer Commonly known as: TUSSIONEX Take 5 mLs by mouth every 12 (twelve) hours as needed for up to 5 days for cough.   cinacalcet 60 MG tablet Commonly known as: SENSIPAR Take 60 mg by mouth daily. Take 1 tablet (90 mg) by mouth in the evening on Mondays, Wednesdays, & Fridays (dialysis) Take 1 tablet (90 mg) by mouth twice daily on Sundays, Tuesdays, Thursdays, Saturdays. (non dialysis)   diazepam 5 MG tablet Commonly known as: VALIUM Take 5 mg by mouth 2 (two) times daily as needed for anxiety.   fluticasone 50 MCG/ACT nasal spray Commonly known as: FLONASE Place 1 spray into both nostrils daily as needed for allergies or rhinitis.   gabapentin 100 MG capsule Commonly known as: NEURONTIN TAKE 1 CAPSULE (100 MG TOTAL) BY MOUTH AT BEDTIME. WHEN NECESSARY FOR NEUROPATHY PAIN   hydrocerin Crea Apply 619 application topically 2 (two) times daily. What changed: how much to take   insulin regular  100 units/mL injection Commonly known as: NOVOLIN R Inject 3-8 Units into the skin See admin instructions. 3 times daily with meals per sliding scale except on Dialysis days, Mon, Wed, Fri   isosorbide-hydrALAZINE 20-37.5 MG tablet Commonly known as: BIDIL Take 1 tablet by mouth 3 (three) times daily. On NON dialysis days pt takes 1 tab 3 times daily, and on Dialysis days, pt takes 1 tab every evning What changed:   when to take this  additional instructions   lidocaine-prilocaine cream Commonly known as: EMLA Apply 1 application topically 3 (three) times a week. 1-2 hours prior to dialysis   loratadine 10 MG tablet Commonly known as: CLARITIN Take 10 mg by mouth daily as needed for allergies.   multivitamin Tabs tablet Take 1 tablet by mouth daily.   omeprazole 20 MG capsule Commonly known as: PRILOSEC Take 20 mg by mouth daily.   ondansetron 4 MG tablet Commonly known as: ZOFRAN Take 1 tablet (4 mg total) by mouth every 6 (six) hours as needed for nausea.   senna 8.6 MG tablet Commonly known as: SENOKOT Take 1 tablet by mouth as needed for constipation.   Vitamin D (Ergocalciferol) 1.25 MG (50000 UT) Caps capsule Commonly known as:  DRISDOL Take 50,000 Units by mouth every Sunday. Takes on Sunday        Allergies  Allergen Reactions  . Adhesive  [Tape] Hives  . Prednisone Swelling and Other (See Comments)    Excessive fluid buildup  . Lisinopril Cough    Consultations:  Nephrology   Procedures/Studies: Dg Chest Port 1 View  Result Date: 05/17/2019 CLINICAL DATA:  Initial evaluation for acute cough, fever. EXAM: PORTABLE CHEST 1 VIEW COMPARISON:  Prior radiograph from 06/18/2018. FINDINGS: Mild cardiomegaly, stable. Mediastinal silhouette within normal limits. Aortic atherosclerosis. Lungs normally inflated. Patchy and hazy opacity within the right lung base, suspicious for possible infectious pneumonitis given history of cough and fever. Underlying  diffuse pulmonary vascular congestion with interstitial prominence, suggesting a degree of pulmonary interstitial edema. No obvious pleural effusion. No pneumothorax. No acute osseous finding. Osteopenia noted. IMPRESSION: 1. Patchy and hazy opacity within the right lung base, suspicious for acute infectious pneumonitis given history of cough and fever. 2. Underlying diffuse pulmonary vascular congestion with interstitial prominence, suggesting a degree of pulmonary interstitial edema. 3.  Aortic Atherosclerosis (ICD10-I70.0). Electronically Signed   By: Jeannine Boga M.D.   On: 05/17/2019 19:26       Subjective: Patient seen and examined at bedside.  She feels slightly better and is hopeful that she could go home after dialysis today.  No overnight fever, nausea, vomiting or worsening shortness of breath.  Still has intermittent dry cough and Tussionex helps the cough.  Discharge Exam: Vitals:   05/22/19 0900 05/22/19 0930  BP: (!) 79/66 (!) 114/93  Pulse: 73   Resp: 18 16  Temp: 97.8 F (36.6 C) 98.7 F (37.1 C)  SpO2: 100% 98%    General: Pt is alert, awake, not in acute distress Cardiovascular: rate controlled, S1/S2 + Respiratory: bilateral decreased breath sounds at bases with some scattered crackles Abdominal: Soft, NT, ND, bowel sounds + Extremities: Trace lower extremity edema, no cyanosis    The results of significant diagnostics from this hospitalization (including imaging, microbiology, ancillary and laboratory) are listed below for reference.     Microbiology: Recent Results (from the past 240 hour(s))  Blood Culture (routine x 2)     Status: None   Collection Time: 05/17/19  7:40 PM   Specimen: BLOOD RIGHT HAND  Result Value Ref Range Status   Specimen Description BLOOD RIGHT HAND  Final   Special Requests   Final    BOTTLES DRAWN AEROBIC AND ANAEROBIC Blood Culture adequate volume   Culture   Final    NO GROWTH 5 DAYS Performed at [facility], [address], [place],  [address]    Report Status 05/22/2019 FINAL  Final  SARS CORONAVIRUS 2 (TAT 6-24 HRS) Nasopharyngeal Nasopharyngeal Swab     Status: Abnormal   Collection Time: 05/17/19  7:40 PM   Specimen: Nasopharyngeal Swab  Result Value Ref Range Status   SARS Coronavirus 2 POSITIVE (A) NEGATIVE Final    Comment: RESULT CALLED TO, READ BACK BY AND VERIFIED WITH: RIEARY A, RN AT 0040 ON 05/18/2019 BY SAINVILUS S (NOTE) SARS-CoV-2 target nucleic acids are DETECTED. The SARS-CoV-2 RNA is generally detectable in upper and lower respiratory specimens during the acute phase of infection. Positive results are indicative of active infection with SARS-CoV-2. Clinical  correlation with patient history and other diagnostic information is necessary to determine patient infection status. Positive results do  not rule out bacterial infection or co-infection with other viruses. The expected result is  Negative. Fact Sheet for Patients: SugarRoll.be Fact Sheet for Healthcare Providers: https://www.woods-mathews.com/ This test is not yet approved or cleared by the [place] FDA and  has been authorized for detection and/or diagnosis of SARS-CoV-2 by FDA under an Emergency Use Authorization (EUA). This EUA will remain  in effect (meaning this test can  be used) for the duration of the COVID-19 declaration under Section 564(b)(1) of the Act, 21 U.S.C. section 360bbb-3(b)(1), unless the authorization is terminated or revoked sooner. Performed at [facility], [place] [address] [place], [place] [address]      Labs: BNP (last 3 results) Recent Labs    06/18/18 1420  BNP 9,628.3*   Basic Metabolic Panel: Recent Labs  Lab 05/17/19 2043 05/18/19 0348 05/19/19 0435 05/20/19 0350 05/21/19 0425 05/22/19 0900  NA  --  137 137 137 136 133*  K  --  2.8* 2.6* 2.9* 3.6 3.6  CL  --  94* 94* 97* 97* 95*  CO2  --  29 28 27 26 23    GLUCOSE  --  142* 82 56* 57* 102*  BUN  --  13 23 11  25* 45*  CREATININE  --  3.98* 6.23* 3.82* 5.69* 7.55*  CALCIUM  --  8.0* 6.9* 7.3* 7.0* 6.9*  MG 1.8  --  1.8  --   --   --   PHOS  --   --   --   --  1.6*  --    Liver Function Tests: Recent Labs  Lab 05/17/19 1936 05/18/19 0348 05/22/19 0900  AST 35 40 31  ALT 17 16 20   ALKPHOS 138* 129* 142*  BILITOT 0.9 1.1 0.4  PROT 6.8 6.6 5.8*  ALBUMIN 3.0* 2.8* 2.6*   No results for input(s): LIPASE, AMYLASE in the last 168 hours. No results for input(s): AMMONIA in the last 168 hours. CBC: Recent Labs  Lab 05/18/19 0348 05/19/19 0435 05/20/19 0350 05/21/19 0425 05/22/19 0900  WBC 4.1 6.3 6.4 6.7 7.4  NEUTROABS 2.4 3.8 2.9 3.0 4.8  HGB 10.4* 9.2* 10.5* 9.9* 9.4*  HCT 32.3* 29.5* 33.3* 32.0* 29.3*  MCV 96.1 98.0 97.4 98.5 96.4  PLT 178 187 209 240 269   Cardiac Enzymes: No results for input(s): CKTOTAL, CKMB, CKMBINDEX, TROPONINI in the last 168 hours. BNP: Invalid input(s): POCBNP CBG: Recent Labs  Lab 05/21/19 0918 05/21/19 1308 05/21/19 1624 05/21/19 2125 05/22/19 0858  GLUCAP 121* 204* 142* 153* 99   D-Dimer No results for input(s): DDIMER in the last 72 hours. Hgb A1c No results for input(s): HGBA1C in the last 72 hours. Lipid Profile No results for input(s): CHOL, HDL, LDLCALC, TRIG, CHOLHDL, LDLDIRECT in the last 72 hours. Thyroid function studies No results for input(s): TSH, T4TOTAL, T3FREE, THYROIDAB in the last 72 hours.  Invalid input(s): FREET3 Anemia work up Recent Labs    05/20/19 0350 05/21/19 0425  FERRITIN 2,503* 1,890*   Urinalysis    Component Value Date/Time   COLORURINE YELLOW 07/07/2016 0914   APPEARANCEUR CLOUDY (A) 07/07/2016 0914   LABSPEC >1.030 (H) 07/07/2016 0914   PHURINE 5.5 07/07/2016 0914   GLUCOSEU NEGATIVE 07/07/2016 0914   HGBUR SMALL (A) 07/07/2016 0914   BILIRUBINUR SMALL (A) 07/07/2016 0914   KETONESUR 15 (A) 07/07/2016 0914   PROTEINUR >300 (A)  07/07/2016 0914   UROBILINOGEN 0.2 07/30/2014 2000   NITRITE NEGATIVE 07/07/2016 0914   LEUKOCYTESUR SMALL (A) 07/07/2016 0914   Sepsis Labs Invalid input(s): PROCALCITONIN,  WBC,  LACTICIDVEN Microbiology Recent  Results (from the past 240 hour(s))  Blood Culture (routine x 2)     Status: None   Collection Time: 05/17/19  7:40 PM   Specimen: BLOOD RIGHT HAND  Result Value Ref Range Status   Specimen Description BLOOD RIGHT HAND  Final   Special Requests   Final    BOTTLES DRAWN AEROBIC AND ANAEROBIC Blood Culture adequate volume   Culture   Final    NO GROWTH 5 DAYS Performed at [facility], [address] [place], [place] [address]    Report Status 05/22/2019 FINAL  Final  SARS CORONAVIRUS 2 (TAT 6-24 HRS) Nasopharyngeal Nasopharyngeal Swab     Status: Abnormal   Collection Time: 05/17/19  7:40 PM   Specimen: Nasopharyngeal Swab  Result Value Ref Range Status   SARS Coronavirus 2 POSITIVE (A) NEGATIVE Final    Comment: RESULT CALLED TO, READ BACK BY AND VERIFIED WITH: RIEARY A, RN AT 0040 ON 05/18/2019 BY SAINVILUS S (NOTE) SARS-CoV-2 target nucleic acids are DETECTED. The SARS-CoV-2 RNA is generally detectable in upper and lower respiratory specimens during the acute phase of infection. Positive results are indicative of active infection with SARS-CoV-2. Clinical  correlation with patient history and other diagnostic information is necessary to determine patient infection status. Positive results do  not rule out bacterial infection or co-infection with other viruses. The expected result is Negative. Fact Sheet for Patients: SugarRoll.be Fact Sheet for Healthcare Providers: https://www.woods-mathews.com/ This test is not yet approved or cleared by the [place] FDA and  has been authorized for detection and/or diagnosis of SARS-CoV-2 by FDA under an Emergency Use Authorization (EUA). This EUA will remain  in effect  (meaning this test can  be used) for the duration of the COVID-19 declaration under Section 564(b)(1) of the Act, 21 U.S.C. section 360bbb-3(b)(1), unless the authorization is terminated or revoked sooner. Performed at [facility], [place] [address], [place], [place] [address]      Time coordinating discharge: 35 minutes  SIGNED:   Aline August, MD  [facility] 05/22/2019, 10:46 AM

## 2019-05-22 NOTE — Progress Notes (Addendum)
[facility] Progress Note   Home meds: - amlodipine 10/ carvedilol 12.5 / isosorbide-hydralazine 20-37.5 - insulin NPH 10hs/ regular 3- 8u SSI - cinacalcet qd/ ferric citrate ac - aspirin 81/ atrovastatin 20 - gabapentin 100hs/ diazepam 5 bid prn/ oxy-acetaminophen qid prn - omeprazole 40/ symbicort qd prn - prn's/ vitamins/ supplements   Outpt KG:MWNUU MWf  4h 67kg 2/2.25 bath AVG Hep 2500 w/ 1066midrun - hect 8ug - mircera 50ug q4wk, last 10/14 - venofer 100 x 5, one left   Assessment/ Plan:   1. COVID+ PNA- on room air now.  2. ESRD - on HD MWF. HD today.   3. HTN/ Vol - BP'slow, on usual low-dose coreg and home bidil, norvasc dec'd to 2.5 bid 4. 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 5. DM on insulin 6. Anemia ckd - last esa on 10/14, next due on 10/28 7. Dispo - possible dc today after HD. OK for dc from renal standpoint.     Kelly Splinter  MD 05/22/2019, 2:08 PM    Subjective:    Patient not examined directly given COVID-19 + status, utilizing exam of the primary team and observations of RN's.     Objective:   BP (!) (P) 74/52   Pulse (P) 74   Temp 97.9 F (36.6 C) (Oral)   Resp (P) 17   Ht 5\' 5"  (1.651 m)   Wt 68.9 kg   SpO2 (P) 94%   BMI 25.28 kg/m   Intake/Output Summary (Last 24 hours) at 05/22/2019 1403 Last data filed at 05/21/2019 1454 Gross per 24 hour  Intake 240 ml  Output -  Net 240 ml   Weight change:   Physical Exam:  Patient not examined directly given COVID-19 + status, utilizing exam of the primary team and observations of RN's.     Imaging: No results found.  Labs: BMET Recent Labs  Lab 05/17/19 1936 05/18/19 0348 05/19/19 0435 05/20/19 0350 05/21/19 0425 05/22/19 0900  NA 137 137 137 137 136 133*  K 2.2* 2.8* 2.6* 2.9* 3.6 3.6  CL 94* 94* 94* 97* 97* 95*  CO2 30 29 28 27 26 23   GLUCOSE 189* 142* 82 56* 57* 102*  BUN 9 13 23 11  25* 45*  CREATININE 3.31* 3.98* 6.23* 3.82*  5.69* 7.55*  CALCIUM 8.0* 8.0* 6.9* 7.3* 7.0* 6.9*  PHOS  --   --   --   --  1.6*  --    CBC Recent Labs  Lab 05/19/19 0435 05/20/19 0350 05/21/19 0425 05/22/19 0900  WBC 6.3 6.4 6.7 7.4  NEUTROABS 3.8 2.9 3.0 4.8  HGB 9.2* 10.5* 9.9* 9.4*  HCT 29.5* 33.3* 32.0* 29.3*  MCV 98.0 97.4 98.5 96.4  PLT 187 209 240 269    Medications:    . amLODipine  2.5 mg Oral BID  . aspirin EC  81 mg Oral Daily  . atorvastatin  20 mg Oral Daily  . carvedilol  12.5 mg Oral Once per day on Sun Tue Thu Sat   And  . carvedilol  12.5 mg Oral QAC supper  . Chlorhexidine Gluconate Cloth  6 each Topical Q0600  . cinacalcet  60 mg Oral QODAY  . doxercalciferol      . doxercalciferol  8 mcg Intravenous Q M,W,F-HD  . ferric citrate  420 mg Oral TID WC  . gabapentin  100 mg Oral QHS  . heparin  5,000 Units Subcutaneous Q8H  . insulin aspart  0-9 Units Subcutaneous TID WC  .  isosorbide-hydrALAZINE  1 tablet Oral 2 times per day on Sun Tue Thu Sat   And  . isosorbide-hydrALAZINE  1 tablet Oral QHS  . mometasone-formoterol  2 puff Inhalation BID  . multivitamin  1 tablet Oral QHS  . pantoprazole  40 mg Oral Daily

## 2019-05-22 NOTE — Progress Notes (Signed)
Patient ID: Destiny Day, female   DOB: 1951-04-06, 68 y.o.   MRN: [id number]  PROGRESS NOTE    Destiny Day  QZE:[id number] DOB: 05/28/1951 DOA: 05/17/2019 PCP: Velna Hatchet, MD   Brief Narrative:  68 year old female with history of end-stage renal on dialysis, diabetes mellitus type 2, chronic anemia, hypertension presented with fever and chills.  Chest x-ray showed infiltrates concerning for pneumonitis and COVID-19 test was positive.  CRP was 9.7, ferritin 1700 on presentation. Nephrology was consulted.  Assessment & Plan:   COVID-19 pneumonia Hypoxia -Chest x-ray showed infiltrates on presentation.  Currently still requiring 1-2 L oxygen via nasal cannula.  No temperature spikes over the last 24 hours.   Recent Labs    05/20/19 0350 05/21/19 0425 05/22/19 0900  FERRITIN 2,503* 1,890*  --   LDH 291* 286* 273*  CRP 6.9* 5.0*  --     Lab Results  Component Value Date   SARSCOV2NAA POSITIVE (A) 05/17/2019   [place] Not Detected 05/04/2019   [place] NEGATIVE 12/20/2018   -Continue monitoring telemetry markers: improving -Continue remdesivir for total of 5 days; Day# 5 today.  Not on steroids because of history of angioedema to prednisone -Cough is improving. -If respiratory status improves after dialysis today, consider discharging him home today.  Diabetes mellitus type 2 with hypoglycemia -NPH on hold.  Continue CBGs with SSI.    Hypertension -Blood pressure intermittently on the lower side.  Monitor.  End-stage renal disease on hemodialysis--nephrology following.  Dialysis as per nephrology schedule  Hypokalemia -Improved  Hyperlipidemia-continue statin  Anemia of chronic disease -From chronic kidney disease.  Hemoglobin stable.  Monitor  DVT prophylaxis: Heparin Code Status: Full Family Communication: Spoke to husband on phone on 05/19/2019 Disposition Plan: Home today after dialysis if respiratory status remains stable and patient  comes off oxygen.  If not, probably tomorrow  Consultants: Nephrology  Procedures: None  Antimicrobials: None   Subjective: Patient seen and examined at bedside.  Feels slightly better.  Tussionex is helping her cough.  Came off oxygen yesterday afternoon but this morning is back on some oxygen.  No overnight fever or vomiting. Objective: Vitals:   05/21/19 1900 05/22/19 0438 05/22/19 0900 05/22/19 0930  BP: (!) 163/90 (!) 150/98 (!) 79/66 (!) 114/93  Pulse: 69 75 73   Resp: 18 18 18 16   Temp: 97.8 F (36.6 C) 98.8 F (37.1 C) 97.8 F (36.6 C) 98.7 F (37.1 C)  TempSrc: Oral Oral Oral Oral  SpO2: 100%  100% 98%  Weight:  68.8 kg    Height:        Intake/Output Summary (Last 24 hours) at 05/22/2019 1052 Last data filed at 05/21/2019 1454 Gross per 24 hour  Intake 240 ml  Output -  Net 240 ml   Filed Weights   05/19/19 1750 05/20/19 0500 05/22/19 0438  Weight: 62 kg 64.2 kg 68.8 kg    Examination:  General exam: No distress.   Respiratory system: Bilateral decreased breath sounds at bases, no wheezing.  Some scattered crackles cardiovascular system: Rate controlled, S1-S2 heard Gastrointestinal system: Abdomen is nondistended, soft and nontender. Normal bowel sounds heard. Extremities: No cyanosis; trace edema   Data Reviewed: I have personally reviewed following labs and imaging studies  CBC: Recent Labs  Lab 05/18/19 0348 05/19/19 0435 05/20/19 0350 05/21/19 0425 05/22/19 0900  WBC 4.1 6.3 6.4 6.7 7.4  NEUTROABS 2.4 3.8 2.9 3.0 4.8  HGB 10.4* 9.2* 10.5* 9.9* 9.4*  HCT 32.3* 29.5* 33.3* 32.0*  29.3*  MCV 96.1 98.0 97.4 98.5 96.4  PLT 178 187 209 240 852   Basic Metabolic Panel: Recent Labs  Lab 05/17/19 2043 05/18/19 0348 05/19/19 0435 05/20/19 0350 05/21/19 0425 05/22/19 0900  NA  --  137 137 137 136 133*  K  --  2.8* 2.6* 2.9* 3.6 3.6  CL  --  94* 94* 97* 97* 95*  CO2  --  29 28 27 26 23   GLUCOSE  --  142* 82 56* 57* 102*  BUN  --  13 23  11  25* 45*  CREATININE  --  3.98* 6.23* 3.82* 5.69* 7.55*  CALCIUM  --  8.0* 6.9* 7.3* 7.0* 6.9*  MG 1.8  --  1.8  --   --   --   PHOS  --   --   --   --  1.6*  --    GFR: Estimated Creatinine Clearance: 6.9 mL/min (A) (by C-G formula based on SCr of 7.55 mg/dL (H)). Liver Function Tests: Recent Labs  Lab 05/17/19 1936 05/18/19 0348 05/22/19 0900  AST 35 40 31  ALT 17 16 20   ALKPHOS 138* 129* 142*  BILITOT 0.9 1.1 0.4  PROT 6.8 6.6 5.8*  ALBUMIN 3.0* 2.8* 2.6*   No results for input(s): LIPASE, AMYLASE in the last 168 hours. No results for input(s): AMMONIA in the last 168 hours. Coagulation Profile: Recent Labs  Lab 05/17/19 1936  INR 1.1   Cardiac Enzymes: No results for input(s): CKTOTAL, CKMB, CKMBINDEX, TROPONINI in the last 168 hours. BNP (last 3 results) No results for input(s): PROBNP in the last 8760 hours. HbA1C: No results for input(s): HGBA1C in the last 72 hours. CBG: Recent Labs  Lab 05/21/19 0918 05/21/19 1308 05/21/19 1624 05/21/19 2125 05/22/19 0858  GLUCAP 121* 204* 142* 153* 99   Lipid Profile: No results for input(s): CHOL, HDL, LDLCALC, TRIG, CHOLHDL, LDLDIRECT in the last 72 hours. Thyroid Function Tests: No results for input(s): TSH, T4TOTAL, FREET4, T3FREE, THYROIDAB in the last 72 hours. Anemia Panel: Recent Labs    05/20/19 0350 05/21/19 0425  FERRITIN 2,503* 1,890*   Sepsis Labs: Recent Labs  Lab 05/17/19 1936  LATICACIDVEN 1.6    Recent Results (from the past 240 hour(s))  Blood Culture (routine x 2)     Status: None   Collection Time: 05/17/19  7:40 PM   Specimen: BLOOD RIGHT HAND  Result Value Ref Range Status   Specimen Description BLOOD RIGHT HAND  Final   Special Requests   Final    BOTTLES DRAWN AEROBIC AND ANAEROBIC Blood Culture adequate volume   Culture   Final    NO GROWTH 5 DAYS Performed at [facility], [place] [address], [place], [place] [address]    Report Status 05/22/2019 FINAL  Final  SARS  CORONAVIRUS 2 (TAT 6-24 HRS) Nasopharyngeal Nasopharyngeal Swab     Status: Abnormal   Collection Time: 05/17/19  7:40 PM   Specimen: Nasopharyngeal Swab  Result Value Ref Range Status   SARS Coronavirus 2 POSITIVE (A) NEGATIVE Final    Comment: RESULT CALLED TO, READ BACK BY AND VERIFIED WITH: RIEARY A, RN AT 0040 ON 05/18/2019 BY SAINVILUS S (NOTE) SARS-CoV-2 target nucleic acids are DETECTED. The SARS-CoV-2 RNA is generally detectable in upper and lower respiratory specimens during the acute phase of infection. Positive results are indicative of active infection with SARS-CoV-2. Clinical  correlation with patient history and other diagnostic information is necessary to determine patient infection status. Positive  results do  not rule out bacterial infection or co-infection with other viruses. The expected result is Negative. Fact Sheet for Patients: SugarRoll.be Fact Sheet for Healthcare Providers: https://www.woods-mathews.com/ This test is not yet approved or cleared by the [place] FDA and  has been authorized for detection and/or diagnosis of SARS-CoV-2 by FDA under an Emergency Use Authorization (EUA). This EUA will remain  in effect (meaning this test can  be used) for the duration of the COVID-19 declaration under Section 564(b)(1) of the Act, 21 U.S.C. section 360bbb-3(b)(1), unless the authorization is terminated or revoked sooner. Performed at [facility], [place] [address], [place],  [address]          Radiology Studies: No results found.      Scheduled Meds: . amLODipine  2.5 mg Oral BID  . aspirin EC  81 mg Oral Daily  . atorvastatin  20 mg Oral Daily  . carvedilol  12.5 mg Oral Once per day on Sun Tue Thu Sat   And  . carvedilol  12.5 mg Oral QAC supper  . Chlorhexidine Gluconate Cloth  6 each Topical Q0600  . cinacalcet  60 mg Oral QODAY  . doxercalciferol  8 mcg Intravenous Q M,W,F-HD  .  ferric citrate  420 mg Oral TID WC  . gabapentin  100 mg Oral QHS  . heparin  5,000 Units Subcutaneous Q8H  . insulin aspart  0-9 Units Subcutaneous TID WC  . isosorbide-hydrALAZINE  1 tablet Oral 2 times per day on Sun Tue Thu Sat   And  . isosorbide-hydrALAZINE  1 tablet Oral QHS  . mometasone-formoterol  2 puff Inhalation BID  . multivitamin  1 tablet Oral QHS  . pantoprazole  40 mg Oral Daily   Continuous Infusions: . remdesivir 100 mg in NS 250 mL 100 mg (05/21/19 1645)          Aline August, MD [facility] 05/22/2019, 10:52 AM

## 2019-05-22 NOTE — Progress Notes (Signed)
Patient discharging home today, no needs identified. Patient will need PTAR home due to patient's husband in quarantine and told that he can leave the house "under no circumstances". CSW completed med necessity and left at the nurse's desk for RN to call after patient has finished dialysis and her last dose of remdesivir, made sure RN knew how to call PTAR.   CSW also added patient's change in dialysis unit to patient's AVS for her paperwork. No further needs at this time.  Laveda Abbe, [place] Clinical Social Worker [phone]

## 2019-05-22 NOTE — Progress Notes (Signed)
Renal Navigator received message from Dr. Schertz/Nephrologist stating that patient may be ready for discharge today. Renal Navigator called patient's husband [name] to discuss plan for OP HD treatment post discharge. He stated understanding that until patient meets [facility] criteria to return to regular seat at [facility], she will treat at [facility] clinic, TTS 12:00pm. She needs to arrive to the parking lot at 11:45am and wait to be called in to the building.  If patient is discharged today or tomorrow, she will start at [facility] tomorrow, 05/23/19. (If patient is ready for discharge on 05/23/19, she can discharge to [facility] and CSW can help arrange with transportation). Renal Navigator spoke with patient's husband about transportation from the hospital at discharge as well as to isolation clinic. He states he cannot pick patient up because he is not off quarantine himself until Wednesday and has been instructed not to leave the house. He states patient can drive herself to OP HD. Renal Navigator notified CSW/L. Ward Chatters of patient's need for transportation home from hospital at discharge.   Alphonzo Cruise, [place] Renal Navigator [phone]

## 2019-05-23 ENCOUNTER — Telehealth: Payer: Self-pay | Admitting: Nephrology

## 2019-05-23 ENCOUNTER — Other Ambulatory Visit: Payer: Self-pay

## 2019-05-23 DIAGNOSIS — Z992 Dependence on renal dialysis: Secondary | ICD-10-CM | POA: Diagnosis not present

## 2019-05-23 DIAGNOSIS — N2581 Secondary hyperparathyroidism of renal origin: Secondary | ICD-10-CM | POA: Diagnosis not present

## 2019-05-23 DIAGNOSIS — D509 Iron deficiency anemia, unspecified: Secondary | ICD-10-CM | POA: Diagnosis not present

## 2019-05-23 DIAGNOSIS — E876 Hypokalemia: Secondary | ICD-10-CM | POA: Diagnosis not present

## 2019-05-23 DIAGNOSIS — D631 Anemia in chronic kidney disease: Secondary | ICD-10-CM | POA: Diagnosis not present

## 2019-05-23 DIAGNOSIS — N186 End stage renal disease: Secondary | ICD-10-CM | POA: Diagnosis not present

## 2019-05-23 DIAGNOSIS — U071 COVID-19: Secondary | ICD-10-CM | POA: Diagnosis not present

## 2019-05-23 NOTE — Telephone Encounter (Signed)
Transition of Care Contact from Inpatient Facility:  Date of discharge: 05/22/2019 Date of contact: 05/23/2019 Method of contact: phone  Attempted to contact patient to discuss transition of care from inpatient admission.  Patient did not answer the phone.  Message was left on patient's voicemail with call back number [phone].  Will attempt to contact again tomorrow.   Jen Mow, PA-C [facility] Pager: [phone]

## 2019-05-24 ENCOUNTER — Telehealth: Payer: Self-pay | Admitting: Physician Assistant

## 2019-05-24 ENCOUNTER — Other Ambulatory Visit: Payer: Self-pay | Admitting: *Deleted

## 2019-05-24 NOTE — Patient Outreach (Signed)
[facility]) Care Management  05/24/2019  Destiny Day 1951/01/26 [id number]   Telephone Assessment/Screen-complex CM services  Referral Date: 05/23/19 Referral Source: [place] [facility] liaison  Referral Reason: Patient was active with [facility] prior to admission, please assign to RN Care Management Coordinator for complex disease management, patient and husband are quarantined COVID +. For questions, please contact:  Destiny Brood, RN BSN CCM  Insurance:NextGen Medicare, [employer] Last admission admit date 05/17/19 d/c date: 05/22/19 for Covid-19 positive on 05/17/19 PNA, hypoxia  Transition of care services noted to be completed by primary care MD office staff- Dr Destiny Day, [facility] RN CM discussed with Destiny Day that the office will call to complete transition of care services. She voiced understanding   Outreach attempt # 1 successful to the home number Patient is able to verify HIPAA, DOB and address Reviewed and addressed referral to Destiny Day with patient  Dialysis  She reports she was re tested for Covid on Tuesday 05/23/19 at the dialysis Day  Destiny Day reports she is seen frequently but the dialysis dietitian who completes labs once a month and provides a print out   She reports she is weighed q 3 days   PNA/Covid /COPD She reports having oxygen while hospitalized but she is not on oxygen at home. She and [facility] RN CM discussed sob and pulse oximeter use.She will request the dialysis RN check her oxygen saturation on Thursday 05/25/19.  She is not followed by a pulmonologist. She states she had been seen by [facility] pulmonology years ago She report had developed a bad cough that was taken care of by  She confirms she is following the [place] Covid precautions but adhering to the 3 Ws (waiting, washing and wearing mask) She also has gloves t home. Destiny Day is at home on quarantine also. She reports she is  quarantine downstairs in their home and his is upstairs  She denies any Covid s/s today She previously took allergy shots biweekly  When she was working Reviewed her after summary new medications and ones to discontinue  She reports she picked up all her medications after d/c   Social: Destiny Day is a 68 year old female who lives at home with her spouse, Destiny Day. 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 She is a dialysis patient who is presently being seen on Tuesdays, Thursdays and Saturdays since her discharge home on 05/22/19. She has been going to the [facility]  Destiny Day at [place]. She went to dialysis son Tuesday 05/23/19. She reports she is independent with her care needs with assistance with iADLs. She denies need of transportation assistance She has children a son and daughter She states she has a daughter in Sports coach in [place].  She reports being on a fixed income of social security She reports she does not have a computer and does not know how to operate one  Conditions: Covid-19 positive on 05/17/19 PNA, hypoxia, CHF, HTN, PAD, asthma, allergies, COPD, hx of CAP, GERD, hx of UGI bleed, DM type 2, hx of traumatic SDH, right side weakness, right achilles tendon contracture, right septic knee, CKD, hx of fall, anemia, hx of transmetatarsal of right foot   DME: Walker ,brace, w/c, cbg meter   Medications: She denies concerns with taking medications as prescribed, affording medications, side effects of medications and questions about medications    Appointments: Friday May 26, 2019 10 am telephone assessment with Dr Destiny Day office  Prior t this Destiny Day reports she was seen by her primary MD staff by going to the office an exam at curbside  Advance Directives:  She denies need for assist with advance directives    Consent: [facility] RN CM reviewed Destiny Day services with patient. Patient gave verbal consent for services [facility] [facility]  telephonic RN CM.   Plan: [facility] RN CM will follow up with Destiny Oliveto within the next 7-10 business days  Pt encouraged to return a call to [place] CM prn  [facility] RN CM sent a successful outreach letter as discussed with Destiny Day brochure enclosed for review  Routed note to MDs/NP/PA  [facility] CM Care Plan Problem One     Most Recent Value  Care Plan Problem One  Risk for re admission related to PNA/Covid  Role Documenting the Problem One  Care Management Telephonic Coordinator  Care Plan for Problem One  Active  [facility] Long Term Goal   Over the next 90 days, patient will not experience Day readmission, as evidence by patient reporting and review of EMR during [facility] RN CCM outreach  [place] [place] Term Goal Start Date  05/24/19  Interventions for Problem One Long Term Goal  discussed follow up appointments for primary care and specialist MDs, reviewed transition of care assessment with her, Assessd for covid s/s, precautions and DME use, confirmed received medications, Discussed pulse oximeter use and pulmonology services  [facility] CM Short Term Goal #1   over the next 10 days patient will have her oxygen saturation checked at dialysis and document the finding to report during the next follow up call  [facility] CM Short Term Goal #1 Start Date  05/24/19  Interventions for Short Term Goal #1  Discussed the covid/copd s/s to report, assessed for these s/s, discussed pulse oximetery use/purpose, encuoraged her to have her's checked at dialysis      Destiny Mar. Lavina Hamman, RN, BSN, [place] Coordinator Office number [phone] Mobile number [phone]  Main [facility] number [phone] Fax number [phone]

## 2019-05-24 NOTE — Telephone Encounter (Signed)
Transitional Care Management Note  Date of discharge: 05/22/2019  Date of contact: 05/24/2019 Method of contact: Phone  Attempted to contact patients to discuss transition of care from inpatient admission. Patient did not answer the phone. Message was left on the patient's voicemail with call back number [phone].

## 2019-05-25 DIAGNOSIS — N186 End stage renal disease: Secondary | ICD-10-CM | POA: Diagnosis not present

## 2019-05-25 DIAGNOSIS — D509 Iron deficiency anemia, unspecified: Secondary | ICD-10-CM | POA: Diagnosis not present

## 2019-05-25 DIAGNOSIS — R0902 Hypoxemia: Secondary | ICD-10-CM | POA: Diagnosis not present

## 2019-05-25 DIAGNOSIS — N2581 Secondary hyperparathyroidism of renal origin: Secondary | ICD-10-CM | POA: Diagnosis not present

## 2019-05-25 DIAGNOSIS — D631 Anemia in chronic kidney disease: Secondary | ICD-10-CM | POA: Diagnosis not present

## 2019-05-25 DIAGNOSIS — Z992 Dependence on renal dialysis: Secondary | ICD-10-CM | POA: Diagnosis not present

## 2019-05-25 DIAGNOSIS — U071 COVID-19: Secondary | ICD-10-CM | POA: Diagnosis not present

## 2019-05-25 DIAGNOSIS — J1289 Other viral pneumonia: Secondary | ICD-10-CM | POA: Diagnosis not present

## 2019-05-25 DIAGNOSIS — E876 Hypokalemia: Secondary | ICD-10-CM | POA: Diagnosis not present

## 2019-05-27 DIAGNOSIS — N2581 Secondary hyperparathyroidism of renal origin: Secondary | ICD-10-CM | POA: Diagnosis not present

## 2019-05-27 DIAGNOSIS — D631 Anemia in chronic kidney disease: Secondary | ICD-10-CM | POA: Diagnosis not present

## 2019-05-27 DIAGNOSIS — D509 Iron deficiency anemia, unspecified: Secondary | ICD-10-CM | POA: Diagnosis not present

## 2019-05-27 DIAGNOSIS — N186 End stage renal disease: Secondary | ICD-10-CM | POA: Diagnosis not present

## 2019-05-27 DIAGNOSIS — E876 Hypokalemia: Secondary | ICD-10-CM | POA: Diagnosis not present

## 2019-05-27 DIAGNOSIS — Z992 Dependence on renal dialysis: Secondary | ICD-10-CM | POA: Diagnosis not present

## 2019-05-28 DIAGNOSIS — N186 End stage renal disease: Secondary | ICD-10-CM | POA: Diagnosis not present

## 2019-05-28 DIAGNOSIS — Z992 Dependence on renal dialysis: Secondary | ICD-10-CM | POA: Diagnosis not present

## 2019-05-28 DIAGNOSIS — I159 Secondary hypertension, unspecified: Secondary | ICD-10-CM | POA: Diagnosis not present

## 2019-05-29 ENCOUNTER — Other Ambulatory Visit: Payer: Self-pay

## 2019-05-29 ENCOUNTER — Other Ambulatory Visit: Payer: Self-pay | Admitting: *Deleted

## 2019-05-29 DIAGNOSIS — E876 Hypokalemia: Secondary | ICD-10-CM | POA: Diagnosis not present

## 2019-05-29 DIAGNOSIS — J45909 Unspecified asthma, uncomplicated: Secondary | ICD-10-CM | POA: Diagnosis not present

## 2019-05-29 DIAGNOSIS — E1122 Type 2 diabetes mellitus with diabetic chronic kidney disease: Secondary | ICD-10-CM | POA: Diagnosis not present

## 2019-05-29 DIAGNOSIS — N186 End stage renal disease: Secondary | ICD-10-CM | POA: Diagnosis not present

## 2019-05-29 DIAGNOSIS — E785 Hyperlipidemia, unspecified: Secondary | ICD-10-CM | POA: Diagnosis not present

## 2019-05-29 DIAGNOSIS — I120 Hypertensive chronic kidney disease with stage 5 chronic kidney disease or end stage renal disease: Secondary | ICD-10-CM | POA: Diagnosis not present

## 2019-05-29 DIAGNOSIS — U071 COVID-19: Secondary | ICD-10-CM | POA: Diagnosis not present

## 2019-05-29 DIAGNOSIS — R05 Cough: Secondary | ICD-10-CM | POA: Diagnosis not present

## 2019-05-29 DIAGNOSIS — R7401 Elevation of levels of liver transaminase levels: Secondary | ICD-10-CM | POA: Diagnosis not present

## 2019-05-29 NOTE — Patient Outreach (Addendum)
  [facility]) Care Management  05/29/2019  Destiny Day [birth year] [id number]   Follow up - Telephone Assessment/Screen-complex CM services  Referral Date: 05/23/19 Referral Source: [facility] liaison  Referral Reason: Patient was active with [place] prior to admission, please assign to RN Care Management Coordinator for complex disease management, patient and husband are quarantined COVID +. For questions, please contact:  Natividad Brood, RN BSN CCM  Insurance:NextGen Medicare, [employer] Last admission admit date 05/17/19 d/c date: 05/22/19 for Covid-19 positive on 05/17/19 PNA, hypoxia  Transition of care services noted to be completed by primary care MD office staff- Dr Ardeth Perfect, [facility] RN CM discussed with Destiny Day that the office will call to complete transition of care services. She voiced understanding   Outreach attempt unsuccessful at the home number  [facility] RN CM not able to leave a message  Outreach to the mobile number and she answered  HIPAA verified   Destiny Day spoke with her primary care MD today 05/29/19 and she states she clarified the cough medication She did get her oxygen saturation checked on Saturday during dialysis She was 915 when she arrived ant 98 % prior to leaving.  Plan [facility] RN CM will follow up with Destiny Day within the next 7-14 days   [facility] CM Care Plan Problem One     Most Recent Value  Care Plan Problem One  Risk for re admission related to PNA/Covid  Role Documenting the Problem One  Care Management Telephonic Coordinator  Care Plan for Problem One  Active  [facility] Long Term Goal   Over the next 90 days, patient will not experience hospital readmission, as evidence by patient reporting and review of EMR during [facility] RN CCM outreach  [facility] Long Term Goal Start Date  05/24/19  [facility] CM Short Term Goal #1   over the next 10 days patient will have her oxygen saturation  checked at dialysis and document the finding to report during the next follow up call  [facility] CM Short Term Goal #1 Start Date  05/24/19  [facility] CM Short Term Goal #1 Met Date  05/30/19      Joelene Millin L. Lavina Hamman, RN, BSN, [place] Coordinator Office number [phone] Mobile number [phone]  Main [facility] number [phone] Fax number [phone]

## 2019-05-30 DIAGNOSIS — D509 Iron deficiency anemia, unspecified: Secondary | ICD-10-CM | POA: Diagnosis not present

## 2019-05-30 DIAGNOSIS — D631 Anemia in chronic kidney disease: Secondary | ICD-10-CM | POA: Diagnosis not present

## 2019-05-30 DIAGNOSIS — Z992 Dependence on renal dialysis: Secondary | ICD-10-CM | POA: Diagnosis not present

## 2019-05-30 DIAGNOSIS — N186 End stage renal disease: Secondary | ICD-10-CM | POA: Diagnosis not present

## 2019-05-30 DIAGNOSIS — E876 Hypokalemia: Secondary | ICD-10-CM | POA: Diagnosis not present

## 2019-05-30 DIAGNOSIS — U071 COVID-19: Secondary | ICD-10-CM | POA: Diagnosis not present

## 2019-05-30 DIAGNOSIS — N2581 Secondary hyperparathyroidism of renal origin: Secondary | ICD-10-CM | POA: Diagnosis not present

## 2019-06-01 ENCOUNTER — Other Ambulatory Visit: Payer: Self-pay | Admitting: Internal Medicine

## 2019-06-01 ENCOUNTER — Ambulatory Visit: Payer: Medicare Other | Admitting: *Deleted

## 2019-06-01 DIAGNOSIS — Z992 Dependence on renal dialysis: Secondary | ICD-10-CM | POA: Diagnosis not present

## 2019-06-01 DIAGNOSIS — N186 End stage renal disease: Secondary | ICD-10-CM | POA: Diagnosis not present

## 2019-06-01 DIAGNOSIS — N2581 Secondary hyperparathyroidism of renal origin: Secondary | ICD-10-CM | POA: Diagnosis not present

## 2019-06-01 DIAGNOSIS — E876 Hypokalemia: Secondary | ICD-10-CM | POA: Diagnosis not present

## 2019-06-01 DIAGNOSIS — U071 COVID-19: Secondary | ICD-10-CM | POA: Diagnosis not present

## 2019-06-01 DIAGNOSIS — Z20822 Contact with and (suspected) exposure to covid-19: Secondary | ICD-10-CM

## 2019-06-01 DIAGNOSIS — D509 Iron deficiency anemia, unspecified: Secondary | ICD-10-CM | POA: Diagnosis not present

## 2019-06-01 DIAGNOSIS — D631 Anemia in chronic kidney disease: Secondary | ICD-10-CM | POA: Diagnosis not present

## 2019-06-03 DIAGNOSIS — E876 Hypokalemia: Secondary | ICD-10-CM | POA: Diagnosis not present

## 2019-06-03 DIAGNOSIS — D631 Anemia in chronic kidney disease: Secondary | ICD-10-CM | POA: Diagnosis not present

## 2019-06-03 DIAGNOSIS — N2581 Secondary hyperparathyroidism of renal origin: Secondary | ICD-10-CM | POA: Diagnosis not present

## 2019-06-03 DIAGNOSIS — Z992 Dependence on renal dialysis: Secondary | ICD-10-CM | POA: Diagnosis not present

## 2019-06-03 DIAGNOSIS — N186 End stage renal disease: Secondary | ICD-10-CM | POA: Diagnosis not present

## 2019-06-03 DIAGNOSIS — D509 Iron deficiency anemia, unspecified: Secondary | ICD-10-CM | POA: Diagnosis not present

## 2019-06-05 DIAGNOSIS — E876 Hypokalemia: Secondary | ICD-10-CM | POA: Diagnosis not present

## 2019-06-05 DIAGNOSIS — Z992 Dependence on renal dialysis: Secondary | ICD-10-CM | POA: Diagnosis not present

## 2019-06-05 DIAGNOSIS — D509 Iron deficiency anemia, unspecified: Secondary | ICD-10-CM | POA: Diagnosis not present

## 2019-06-05 DIAGNOSIS — D631 Anemia in chronic kidney disease: Secondary | ICD-10-CM | POA: Diagnosis not present

## 2019-06-05 DIAGNOSIS — N2581 Secondary hyperparathyroidism of renal origin: Secondary | ICD-10-CM | POA: Diagnosis not present

## 2019-06-05 DIAGNOSIS — N186 End stage renal disease: Secondary | ICD-10-CM | POA: Diagnosis not present

## 2019-06-06 ENCOUNTER — Other Ambulatory Visit: Payer: Self-pay | Admitting: *Deleted

## 2019-06-06 ENCOUNTER — Other Ambulatory Visit: Payer: Self-pay

## 2019-06-06 NOTE — Patient Outreach (Signed)
[facility] ([facility]) Care Management  06/06/2019  Destiny Day 06/21/1951 [id number]   Follow up - Telephone Assessment/Screen-complex CM services  Referral Date:05/23/19 Referral Source:[facility] Hospital liaison Referral Reason:Patient was active with [facility] Health Coach prior to admission, please assign to RN Care Management Coordinator for complex disease management, patient and husband are quarantined COVID +. For questions, please contact:  Victoria Brewer, RN BSN CCM  Insurance:NextGen Medicare, [employer] Last admission admit date 05/17/19 d/c date: 05/22/19 for Covid-19 positive on 05/17/19 PNA, hypoxia  Transition of care services noted to be completed by primary care MD office staff- Dr Holwerda, [facility]  Outreach to the mobile number and she answered  She prefers ALL calls on her mobile phone** HIPAA verified    Destiny Day reports she has had 2 negative Covid tests and is now started going back [facility] [place] to her [place] on Mondays, Wednesdays and Fridays.  Her first day back was on 06/05/19. She voiced appreciation of this and discussed the joy of completing Karaoke with the other HD patients present on 06/05/19.   She reports she and her husband are no longer on Covid quarantine. He was able to go out today to assist in the school system.   Diabetes  Her HgA1c is reported to be 7. She reports cbg, vital sign checks and labs completed at HD. She reports being followed by Kathy at [facility] for education and DM care. She reports being given a new cbg monitor kit. She reports use of the Freestyle meter but states she is not preferring the use as she and her husband are not able to apply the Freestyle needle. Her daughter was doing this for her but since Covid pandemic her daughter has been maintaining the Covid pandemic recommended precautions, therefore has not been visiting to  protect Destiny Day from being reinfected. Destiny Day reports her husband has been assisting with her nutrition/meals. He continues to stay on the second level of her home and she remains on the lower level. She reports decrease intake of "candy, starches and bread" She reports getting 2-3 meals a day. She reports not eating lunch on dialysis days. She returns to see kathy in December 2020.  DME- Scooter (motorized) Destiny Day report wanting to obtain a scooter. Presently she uses her walker. She gets sob and is not able to walk long distances. She has difficulty getting her walker and Rolator in and out of her car. She reports going out to various stores and not being able to find a motorized cart or scooter the is present or even working to assist her. [facility] RN CM discussed with her the process of having her primary care MD complete a face to face visit per medicare guidelines and completing orders that are faxed to a local DME company for processing. She informs [facility] RN CM her preferences for possible DME assistance are [facility] and [facility]. She does not prefer use of [facility]. She voiced understanding   Appointments 01/04/20 0930 Dr [facility] f/u  Plan [facility] RN CM will follow up with Destiny Day within the next 14-21 days Pt encouraged to return a call to [facility] RN CM prn  Routed note to MD/NP/PA  [facility] CM Care Plan Problem One     Most Recent Value  Care Plan Problem One  Risk for re admission related to PNA/Covid  Role Documenting the Problem One    Care Management Telephonic Coordinator  Care Plan for Problem One  Active  [facility] Long Term Goal   Over the next 90 days, patient will not experience hospital readmission, as evidence by patient reporting and review of EMR during [facility] RN CCM outreach  [facility] Long Term Goal Start Date  05/24/19  Interventions for Problem One Long Term Goal  assessed DM care at home and HD,   [facility] CM Short Term Goal #1   over the next 10 days patient will  have her oxygen saturation checked at dialysis and document the finding to report during the next follow up call  [facility] CM Short Term Goal #1 Start Date  05/24/19  [facility] CM Short Term Goal #1 Met Date  05/30/19       L. , RN, BSN, CCM [facility] Telephonic Care Management Care Coordinator Office number [phone] Mobile number [phone]  Main [facility] number [phone] Fax number [phone]  

## 2019-06-07 DIAGNOSIS — Z992 Dependence on renal dialysis: Secondary | ICD-10-CM | POA: Diagnosis not present

## 2019-06-07 DIAGNOSIS — N2581 Secondary hyperparathyroidism of renal origin: Secondary | ICD-10-CM | POA: Diagnosis not present

## 2019-06-07 DIAGNOSIS — D631 Anemia in chronic kidney disease: Secondary | ICD-10-CM | POA: Diagnosis not present

## 2019-06-07 DIAGNOSIS — E876 Hypokalemia: Secondary | ICD-10-CM | POA: Diagnosis not present

## 2019-06-07 DIAGNOSIS — N186 End stage renal disease: Secondary | ICD-10-CM | POA: Diagnosis not present

## 2019-06-07 DIAGNOSIS — D509 Iron deficiency anemia, unspecified: Secondary | ICD-10-CM | POA: Diagnosis not present

## 2019-06-09 ENCOUNTER — Other Ambulatory Visit: Payer: Self-pay | Admitting: *Deleted

## 2019-06-09 DIAGNOSIS — N2581 Secondary hyperparathyroidism of renal origin: Secondary | ICD-10-CM | POA: Diagnosis not present

## 2019-06-09 DIAGNOSIS — N186 End stage renal disease: Secondary | ICD-10-CM | POA: Diagnosis not present

## 2019-06-09 DIAGNOSIS — E876 Hypokalemia: Secondary | ICD-10-CM | POA: Diagnosis not present

## 2019-06-09 DIAGNOSIS — D509 Iron deficiency anemia, unspecified: Secondary | ICD-10-CM | POA: Diagnosis not present

## 2019-06-09 DIAGNOSIS — D631 Anemia in chronic kidney disease: Secondary | ICD-10-CM | POA: Diagnosis not present

## 2019-06-09 DIAGNOSIS — Z992 Dependence on renal dialysis: Secondary | ICD-10-CM | POA: Diagnosis not present

## 2019-06-09 NOTE — Patient Outreach (Signed)
[facility]) Care Management  06/09/2019  ALEJANDRO ADCOX January 15, 1951 [id number]   Care coordination  Pt voiced interest in obtaining a scooter during the last contact on 06/06/19  [facility] RN CM left a message for the RN of Dr Damita Dunnings at [phone] for possible assistance with A scooter  Called and spoke with Ronalee Belts at [facility] at [phone] [name], [place] [address] who reports at this time [name] does not file the insurance for patients and patients at this time would have to pay out of pocket first to them and file for reimbursement from their insurance company. Use of [employer] is recommended for filing  Heather at [facility] [place] [address], [place], [place] [address] at [phone] also confirms that patients at this time would have to pay out of pocket first to them and file for reimbursement from their insurance company. Use of [employer] is recommended for filing   Plan [facility] RN CM will follow up with [name] within the next 14-21 days Pt encouraged to return a call to [place] CM prn  Joelene Millin L. Lavina Hamman, RN, BSN, [place] Coordinator Office number [phone] Mobile number [phone]  Main [facility] number [phone] Fax number [phone]

## 2019-06-12 DIAGNOSIS — Z992 Dependence on renal dialysis: Secondary | ICD-10-CM | POA: Diagnosis not present

## 2019-06-12 DIAGNOSIS — D509 Iron deficiency anemia, unspecified: Secondary | ICD-10-CM | POA: Diagnosis not present

## 2019-06-12 DIAGNOSIS — E876 Hypokalemia: Secondary | ICD-10-CM | POA: Diagnosis not present

## 2019-06-12 DIAGNOSIS — N186 End stage renal disease: Secondary | ICD-10-CM | POA: Diagnosis not present

## 2019-06-12 DIAGNOSIS — D631 Anemia in chronic kidney disease: Secondary | ICD-10-CM | POA: Diagnosis not present

## 2019-06-12 DIAGNOSIS — N2581 Secondary hyperparathyroidism of renal origin: Secondary | ICD-10-CM | POA: Diagnosis not present

## 2019-06-13 ENCOUNTER — Encounter: Payer: Self-pay | Admitting: Internal Medicine

## 2019-06-13 ENCOUNTER — Other Ambulatory Visit: Payer: Self-pay

## 2019-06-13 ENCOUNTER — Ambulatory Visit (INDEPENDENT_AMBULATORY_CARE_PROVIDER_SITE_OTHER): Payer: Medicare Other | Admitting: Internal Medicine

## 2019-06-13 VITALS — BP 118/58 | HR 90 | Temp 97.7°F | Ht 65.0 in | Wt 150.0 lb

## 2019-06-13 DIAGNOSIS — J4540 Moderate persistent asthma, uncomplicated: Secondary | ICD-10-CM

## 2019-06-13 DIAGNOSIS — I480 Paroxysmal atrial fibrillation: Secondary | ICD-10-CM | POA: Diagnosis not present

## 2019-06-13 DIAGNOSIS — U071 COVID-19: Secondary | ICD-10-CM

## 2019-06-13 DIAGNOSIS — D689 Coagulation defect, unspecified: Secondary | ICD-10-CM

## 2019-06-13 DIAGNOSIS — J301 Allergic rhinitis due to pollen: Secondary | ICD-10-CM

## 2019-06-13 DIAGNOSIS — S065X9A Traumatic subdural hemorrhage with loss of consciousness of unspecified duration, initial encounter: Secondary | ICD-10-CM

## 2019-06-13 DIAGNOSIS — J449 Chronic obstructive pulmonary disease, unspecified: Secondary | ICD-10-CM

## 2019-06-13 MED ORDER — CETIRIZINE HCL 10 MG PO TABS: 10.0000 mg | ORAL_TABLET | Freq: Every day | ORAL | 3 refills | Status: DC

## 2019-06-13 MED ORDER — ALBUTEROL SULFATE HFA 108 (90 BASE) MCG/ACT IN AERS: 1.0000 | INHALATION_SPRAY | Freq: Four times a day (QID) | RESPIRATORY_TRACT | 11 refills | Status: DC | PRN

## 2019-06-13 NOTE — Progress Notes (Signed)
Destiny Day    [id number]    03-28-1951  Primary Care Physician:Velna Hatchet, MD  Referring Physician: Velna Hatchet, [place],  [place] [address] Reason for Consultation: "Pneumonia, asthma post Covid" Date of Consultation: 06/13/2019  Chief complaint:   Chief Complaint  Patient presents with  . Consult    referred by PCP for asthma, post-covid.       HPI: [name] 69 year old woman with a history of type 2 diabetes mellitus and diabetic nephropathy resulting in end-stage renal disease on dialysis who presents for new patient referral.  She was recently hospitalized from October 21 to October 26 for COVID-19 pneumonia, and was treated with remdesivir, but not prednisone due to concerns of a supposed allergy.  She reports coughing and sneezing and was diagnosed with asthma and seasonal allergies - diagnosed in her 29s.  She did have childhood respiratory disease but never sought much medical attention.   She has had SPT and had SCIT in the past probably over 10 years ago.   Biggest concern today is the coughing and sneezing as well as dyspnea on exertion. She is on loratidine on for allergic rhinitis. She is taking flonase as needed.   Current Regimen: symbicort - takes at nighttime, pro-air she has run out.  Asthma Triggers: seasonal allergies, dust, bleach and cleaning products Exacerbations in the last year: she does not take prednisone because she had an experience with fluid retention.  History of hospitalization or intubation: yes hospitalized, but never intubated. Has been on bipap.  Atopy: no hives/eczema Allergy Testing: has had in the past GERD: yes, takes omeprazole Allergic Rhinitis: yes - on flonase  Social history: Occupation: retired as a Retail banker and raised in [place] History  . Occupation: Retired  Tobacco Use  . Smoking status: Former Smoker    Packs/day: 0.35    Years:  40.00    Pack years: 14.00    Types: Cigarettes    Quit date: 05/10/2012    Years since quitting: 7.0  . Smokeless tobacco: Never Used  Substance and Sexual Activity  . Alcohol use: No  . Drug use: No    Comment: marijuana; quit in early 1980's  . Sexual activity: Yes   Relevant family history:  Family History  Problem Relation Age of Onset  . Other Mother        not sure of cause of death  . Diabetes Father   . Pancreatic cancer Maternal Grandmother   . Colon cancer Neg Hx     Past Medical History:  Diagnosis Date  . Abdominal bruit   . Anemia   . Anxiety   . Arthritis    Osteoarthritis  . Asthma   . Cervical disc disease    "pinced nerve"  . CHF (congestive heart failure) ([place])   . Complication of anesthesia    " after I got home from my last procedure, I started itching."  . Diabetes mellitus    Type II  . Diverticulitis   . ESRD (end stage renal disease) ([place])    dialysis - M/W/F- [place]  . GERD (gastroesophageal reflux disease)    from medications  . GI bleed 03/31/2013  . Head injury 07/2017  . History of hiatal hernia   . Hyperlipidemia   . Hypertension   . Neuropathy    left leg  . Osteoporosis   . Peripheral vascular disease ([place])   .  Pneumonia    "very young" and a few years ago  . PONV (postoperative nausea and vomiting)   . Seasonal allergies   . Shortness of breath dyspnea    WIth exertion, when fluid builds  . Sleep apnea    can't afford cpap     Past Surgical History:  Procedure Laterality Date  . A/V SHUNTOGRAM N/A 09/22/2016   Procedure: A/V Shuntogram - left arm;  Surgeon: Serafina Mitchell, MD;  Location: [place] [facility];  Service: Cardiovascular;  Laterality: N/A;  . A/V SHUNTOGRAM N/A 03/22/2018   Procedure: A/V SHUNTOGRAM - left arm;  Surgeon: Serafina Mitchell, MD;  Location: [place] [facility];  Service: Cardiovascular;  Laterality: N/A;  . A/V SHUNTOGRAM Left 11/17/2018   Procedure: A/V SHUNTOGRAM;  Surgeon: Angelia Mould, MD;  Location: [place] [facility];  Service: Cardiovascular;  Laterality: Left;  . ABDOMINAL HYSTERECTOMY  1993`  . AMPUTATION Left 09/01/2016   Procedure: LEFT FOOT TRANSMETATARSAL AMPUTATION;  Surgeon: Newt Minion, MD;  Location: [place];  Service: Orthopedics;  Laterality: Left;  . AMPUTATION Right 08/11/2017   Procedure: RIGHT GREAT TOE AMPUTATION DIGIT;  Surgeon: Rosetta Posner, MD;  Location: [place];  Service: Vascular;  Laterality: Right;  . AMPUTATION Right 12/31/2017   Procedure: RIGHT TRANSMETATARSAL AMPUTATION;  Surgeon: Newt Minion, MD;  Location: [place];  Service: Orthopedics;  Laterality: Right;  . AV FISTULA PLACEMENT Left 04/21/2016   Procedure: INSERTION OF ARTERIOVENOUS (AV) GORE-TEX GRAFT ARM LEFT;  Surgeon: Elam Dutch, MD;  Location: [place];  Service: Vascular;  Laterality: Left;  . [place] TRANSPOSITION Left 07/10/2014   Procedure: BASCILIC VEIN TRANSPOSITION;  Surgeon: Angelia Mould, MD;  Location: [place];  Service: Vascular;  Laterality: Left;  . BASCILIC VEIN TRANSPOSITION Right 11/08/2014   Procedure: FIRST STAGE BASILIC VEIN TRANSPOSITION;  Surgeon: Angelia Mould, MD;  Location: [place];  Service: Vascular;  Laterality: Right;  . BASCILIC VEIN TRANSPOSITION Right 01/18/2015   Procedure: SECOND STAGE BASILIC VEIN TRANSPOSITION;  Surgeon: Angelia Mould, MD;  Location: [facility];  Service: Vascular;  Laterality: Right;  . CRANIOTOMY N/A 08/23/2017   Procedure: CRANIOTOMY HEMATOMA EVACUATION SUBDURAL;  Surgeon: Ashok Pall, MD;  Location: [place];  Service: Neurosurgery;  Laterality: N/A;  . CRANIOTOMY Left 08/24/2017   Procedure: CRANIOTOMY FOR RECURRENT ACUTE SUBDURAL HEMATOMA;  Surgeon: Ashok Pall, MD;  Location: [facility];  Service: Neurosurgery;  Laterality: Left;  . ESOPHAGOGASTRODUODENOSCOPY N/A 03/31/2013   Procedure: ESOPHAGOGASTRODUODENOSCOPY (EGD);  Surgeon: Gatha Mayer, MD;  Location: [facility] ENDOSCOPY;  Service: Endoscopy;   Laterality: N/A;  . EYE SURGERY     laser surgery  . FEMORAL-POPLITEAL BYPASS GRAFT Left 07/08/2016   Procedure: LEFT  FEMORAL-BELOW KNEE POPLITEAL ARTERY BYPASS GRAFT USING 6MM X 80 CM PROPATEN GORETEX GRAFT WITH RINGS.;  Surgeon: Rosetta Posner, MD;  Location: [place];  Service: Vascular;  Laterality: Left;  . FEMORAL-POPLITEAL BYPASS GRAFT Right 08/11/2017   Procedure: RIGHT FEMORAL TO BELOW KNEE POPLITEAL ARTERKY  BYPASS GRAFT USING 6MM RINGED PROPATEN GRAFT;  Surgeon: Rosetta Posner, MD;  Location: [facility];  Service: Vascular;  Laterality: Right;  . FISTULOGRAM Left 10/29/2014   Procedure: FISTULOGRAM;  Surgeon: Angelia Mould, MD;  Location: [facility] CATH LAB;  Service: Cardiovascular;  Laterality: Left;  [place] GAS/FLUID EXCHANGE Left 12/20/2018   Procedure: AIR GAS EXCHANGE;  Surgeon: Jalene Mullet, MD;  Location: [place];  Service: Ophthalmology;  Laterality: Left;  .  KNEE ARTHROSCOPY Right 05/06/2018   Procedure: RIGHT KNEE ARTHROSCOPY;  Surgeon: Newt Minion, MD;  Location: [place];  Service: Orthopedics;  Laterality: Right;  . KNEE ARTHROSCOPY Right 06/10/2018   Procedure: RIGHT KNEE ARTHROSCOPY AND DEBRIDEMENT;  Surgeon: Newt Minion, MD;  Location: [place];  Service: Orthopedics;  Laterality: Right;  . LIGATION OF ARTERIOVENOUS  FISTULA Left 04/21/2016   Procedure: LIGATION OF ARTERIOVENOUS  FISTULA LEFT ARM;  Surgeon: Elam Dutch, MD;  Location: [place];  Service: Vascular;  Laterality: Left;  . LOWER EXTREMITY ANGIOGRAPHY N/A 04/06/2017   Procedure: Lower Extremity Angiography - Right;  Surgeon: Serafina Mitchell, MD;  Location: [place] [facility];  Service: Cardiovascular;  Laterality: N/A;  . MEMBRANE PEEL Left 12/20/2018   Procedure: MEMBRANE PEEL;  Surgeon: Jalene Mullet, MD;  Location: [facility];  Service: Ophthalmology;  Laterality: Left;  . PATCH ANGIOPLASTY Right 01/18/2015   Procedure: BASILIC VEIN PATCH ANGIOPLASTY USING VASCUGUARD PATCH;  Surgeon: Angelia Mould, MD;  Location:  [place];  Service: Vascular;  Laterality: Right;  . PERIPHERAL VASCULAR BALLOON ANGIOPLASTY  09/22/2016   Procedure: Peripheral Vascular Balloon Angioplasty;  Surgeon: Serafina Mitchell, MD;  Location: [place] [facility];  Service: Cardiovascular;;  Lt. Fistula  . PERIPHERAL VASCULAR BALLOON ANGIOPLASTY Left 03/22/2018   Procedure: PERIPHERAL VASCULAR BALLOON ANGIOPLASTY;  Surgeon: Serafina Mitchell, MD;  Location: [place] [facility];  Service: Cardiovascular;  Laterality: Left;  Arm shunt  . PERIPHERAL VASCULAR CATHETERIZATION N/A 06/23/2016   Procedure: Abdominal Aortogram w/Lower Extremity;  Surgeon: Serafina Mitchell, MD;  Location: [place] [facility];  Service: Cardiovascular;  Laterality: N/A;  . PERIPHERAL VASCULAR CATHETERIZATION  06/23/2016   Procedure: Peripheral Vascular Intervention;  Surgeon: Serafina Mitchell, MD;  Location: [place] [facility];  Service: Cardiovascular;;  lt common and external illiac artery  . PHOTOCOAGULATION WITH LASER Left 12/20/2018   Procedure: PHOTOCOAGULATION WITH LASER;  Surgeon: Jalene Mullet, MD;  Location: [place];  Service: Ophthalmology;  Laterality: Left;  . 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   Procedure: REPAIR OF COMPLEX TRACTION RETINAL DETACHMENT;  Surgeon: Jalene Mullet, MD;  Location: [place];  Service: Ophthalmology;  Laterality: Left;    Review of systems: Constitutional: Negative for fever and chills, sweats, and weight loss HENT: Itchy throat Eyes:  Itchy, watery eyes. Respiratory: Coughing, no hemoptysis + dyspnea on exertion Cardiovascular: Negative for chest pain and palpitations.  Gastrointestinal: Negative for vomiting, diarrhea, blood per rectum. Genitourinary: Negative for dysuria, urgency, frequency and hematuria.  Musculoskeletal: Negative for myalgias, back pain and joint pain.  Skin: Negative for itching and rash.  Neurological: Negative for dizziness, tremors, focal weakness, seizures and loss of consciousness.   Endo/Heme/Allergies: Positive for environmental allergies.  Psychiatric/Behavioral: Negative for depression, suicidal ideas and hallucinations.  All other systems reviewed and are negative.  Physical Exam: Blood pressure (!) 118/58, pulse 90, temperature 97.7 F (36.5 C), temperature source Temporal, height 5\' 5"  (1.651 m), weight 150 lb (68 kg), SpO2 100 %. Gen:      No acute distress Eyes: Mild conjunctival injection ENT:  no nasal polyps, mucus membranes moist, cobblestoning in oropharynx Neck:     Supple, no thyromegaly Lungs:    No increased respiratory effort, symmetric chest wall excursion, clear to auscultation bilaterally, no wheezes or crackles CV:         Regular rate and rhythm; soft systolic murmur, rubs, or gallops.  No pedal edema Abd:      +  bowel sounds; soft, non-tender; no distension MSK: no acute synovitis of DIP or PIP joints, no mechanics hands.  Skin:      Warm and dry; no rashes Neuro: normal speech, no focal facial asymmetry Psych: alert and oriented x3, normal mood and affect Ext: Left upper extremity AV graft with palpable thrill and bruit  Data Reviewed: Imaging: I have personally reviewed the ET angio from November 2019 which demonstrates bilateral pleural effusions, pulmonary edema, cardiomegaly.  X-ray from May 17, 2019 personally reviewed patchy alveolar opacities, consistent with pneumonia  PFTs: No documented PFTs on file  Labs:  Immunization status: Immunization History  Administered Date(s) Administered  . Hepatitis B, adult 01/24/2016  . Influenza, High Dose Seasonal PF 04/19/2019  . Influenza,inj,Quad PF,6+ Mos 04/14/2013  . Pneumococcal Conjugate-13 05/02/2018  . Pneumococcal Polysaccharide-23 01/10/2016  . Tdap 09/11/2011   up to date and documented.  Assessment:  Ms. Cowell is a 68 year old woman with history of end-stage renal disease who presents after hospitalization for COVID-19 pneumonia.  1. Shortness of breath -  multifactorial 2. COVID 19 pneumonia 3. Heart Failure with preserved ejection fraction 4. 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  5. Seasonal allergic rhinitis 6.  OSA, untreated   Plan/Recommendations: Ms. Derstine has dyspnea on exertion and poorly controlled allergic rhinitis which is greatly affecting her current symptoms.  I recommend starting fluticasone intranasal daily, as well as switching her loratadine to cetirizine.  She was also only taking her Symbicort once a day, but will start taking it twice a day now.  I have refilled her ProAir.  In the future we will need to address her OSA.  She is still occasionally smoking a cigarette.  Return to Care: Return in about 3 months (around 09/13/2019).  Lenice Llamas, MD Pulmonary and [place]  CC: Velna Hatchet, MD

## 2019-06-13 NOTE — Patient Instructions (Addendum)
Your current symptoms of cough and sneezing are related to your allergies.   Let's do the following plan to get this under control.   Stop taking claritin - start taking zyrtec (cetirizine) 1 tablet at bedtime. It might make you a little sleepy. You can find this at [facility].   Flonase - fluticasone - 1 spray on each side of your nose twice a day for first week, then 1 spray on each side.   Instructions for use:  If you also use a saline nasal spray or rinse, use that first.  Position the head with the chin slightly tucked. Use the right hand to spray into the left nostril and the right hand to spray into the left nostril.   Point the bottle away from the septum of your nose (cartilage that divides the two sides of your nose).   Hold the nostril closed on the opposite side from where you will spray  Spray once and gently sniff to pull the medicine into the higher parts of your nose.  Don't sniff too hard as the medicine will drain down the back of your throat instead.  Repeat with a second spray on the same side if prescribed.  Repeat on the other side of your nose.  Please start taking the symbicort twice a day.  You can take the albuterol (proair) as a rescue inhaler as needed.  ------------------------------------------------------------------------------------------------------------------- Metered Dose Inhaler (MDI) Instructions (symbicort)  Before using your inhaler for the first time: 1. Take the cap off the mouthpiece. 2. Shake the inhaler for 5 seconds 3. Press down on the canister to spray the medicine into the air. 4. Repeat these steps 3 more times. If you haven't used your inhaler in more than 2 weeks, repeat these steps before using it.  To use your inhaler: 1. Take the cap off the mouthpiece 2. Shake the inhaler for 5 seconds. 3. Hold it upright with your finger on the top of the canister and your thumb on the bottom of the inhaler. 4. Breathe out. 5. Close your lips  around the mouthpiece. 6. As you start to inhale the next breath, press down on the canister. 7. Inhale deeply and slowly through your mouth. 8. Hold your breath for 5 to 10 seconds to keep the medicine in your lungs. 9. Let your breath out.   10. Repeat these steps if you are supposed to take 2 puffs.  11. Put the cap back on the mouthpiece 12. Remember to rinse, gargle and spit with water after use if your inhaler has a steroid in it (Advair, Symbicort, Dulera, Qvar, Flovent)  Checking your MDI It is important that you know how much medication is left in your inhaler. The number of puffs contained in your MDI is printed on the side of the canister. After you have used that number of puffs, you must discard your inhaler even if it continues to spray. Keep track of how many puffs you have used. You also must include priming puffs in this total. If you use an MDI every day for control of symptoms, you can determine how long it will last by dividing the total number of puffs in the MDI by the total puffs you use every day. For example: 2 puffs x 2 times per day = 4 total puffs per day. At 120 puffs, the MDI will last 30 days. If you use an inhaler only when you need to, you must keep track of how many times you spray  the inhaler. Some of the newer MDIs have counting devices built in.  If your MDI does not have a dose counter, you can obtain a device that attaches to the MDI and counts down the number of puffs each time you press the inhaler. Ask your health care professional for more information about these devices, as well as how to best keep track of your medicine without an add-on device (if you prefer).

## 2019-06-14 DIAGNOSIS — D509 Iron deficiency anemia, unspecified: Secondary | ICD-10-CM | POA: Diagnosis not present

## 2019-06-14 DIAGNOSIS — N2581 Secondary hyperparathyroidism of renal origin: Secondary | ICD-10-CM | POA: Diagnosis not present

## 2019-06-14 DIAGNOSIS — N186 End stage renal disease: Secondary | ICD-10-CM | POA: Diagnosis not present

## 2019-06-14 DIAGNOSIS — Z992 Dependence on renal dialysis: Secondary | ICD-10-CM | POA: Diagnosis not present

## 2019-06-14 DIAGNOSIS — D631 Anemia in chronic kidney disease: Secondary | ICD-10-CM | POA: Diagnosis not present

## 2019-06-14 DIAGNOSIS — E876 Hypokalemia: Secondary | ICD-10-CM | POA: Diagnosis not present

## 2019-06-16 DIAGNOSIS — D631 Anemia in chronic kidney disease: Secondary | ICD-10-CM | POA: Diagnosis not present

## 2019-06-16 DIAGNOSIS — E876 Hypokalemia: Secondary | ICD-10-CM | POA: Diagnosis not present

## 2019-06-16 DIAGNOSIS — N186 End stage renal disease: Secondary | ICD-10-CM | POA: Diagnosis not present

## 2019-06-16 DIAGNOSIS — N2581 Secondary hyperparathyroidism of renal origin: Secondary | ICD-10-CM | POA: Diagnosis not present

## 2019-06-16 DIAGNOSIS — D509 Iron deficiency anemia, unspecified: Secondary | ICD-10-CM | POA: Diagnosis not present

## 2019-06-16 DIAGNOSIS — Z992 Dependence on renal dialysis: Secondary | ICD-10-CM | POA: Diagnosis not present

## 2019-06-18 DIAGNOSIS — D509 Iron deficiency anemia, unspecified: Secondary | ICD-10-CM | POA: Diagnosis not present

## 2019-06-18 DIAGNOSIS — D631 Anemia in chronic kidney disease: Secondary | ICD-10-CM | POA: Diagnosis not present

## 2019-06-18 DIAGNOSIS — N186 End stage renal disease: Secondary | ICD-10-CM | POA: Diagnosis not present

## 2019-06-18 DIAGNOSIS — E876 Hypokalemia: Secondary | ICD-10-CM | POA: Diagnosis not present

## 2019-06-18 DIAGNOSIS — N2581 Secondary hyperparathyroidism of renal origin: Secondary | ICD-10-CM | POA: Diagnosis not present

## 2019-06-18 DIAGNOSIS — Z992 Dependence on renal dialysis: Secondary | ICD-10-CM | POA: Diagnosis not present

## 2019-06-20 ENCOUNTER — Other Ambulatory Visit: Payer: Self-pay

## 2019-06-20 ENCOUNTER — Other Ambulatory Visit: Payer: Self-pay | Admitting: *Deleted

## 2019-06-20 DIAGNOSIS — E876 Hypokalemia: Secondary | ICD-10-CM | POA: Diagnosis not present

## 2019-06-20 DIAGNOSIS — Z992 Dependence on renal dialysis: Secondary | ICD-10-CM | POA: Diagnosis not present

## 2019-06-20 DIAGNOSIS — D509 Iron deficiency anemia, unspecified: Secondary | ICD-10-CM | POA: Diagnosis not present

## 2019-06-20 DIAGNOSIS — D631 Anemia in chronic kidney disease: Secondary | ICD-10-CM | POA: Diagnosis not present

## 2019-06-20 DIAGNOSIS — N2581 Secondary hyperparathyroidism of renal origin: Secondary | ICD-10-CM | POA: Diagnosis not present

## 2019-06-20 DIAGNOSIS — N186 End stage renal disease: Secondary | ICD-10-CM | POA: Diagnosis not present

## 2019-06-20 NOTE — Patient Outreach (Addendum)
[facility]) Care Management  06/20/2019  Destiny Day October 08, 1950 [id number]   Follow up -Telephone Assessment/Screen-complex CM services  Referral Date:05/23/19 Referral Source:[facility] liaison Referral Reason:Patient was active with [facility] prior to admission, please assign to RN Care Management Coordinator for complex disease management, patient and husband are quarantined COVID +. For questions, please contact:  Natividad Brood, RN BSN CCM  Insurance:[employer] Medicare, [employer] Last admission admit date 05/17/19 d/c date: 05/22/19 for Covid-19 positive on 05/17/19 PNA, hypoxia  Outreach to the mobile number and she answered  She prefers ALL calls on her mobile phone** HIPAA verified  Destiny Day is at dialysis today as her schedule has been changed related to the Thanksgiving holiday She reports this week that she is been seen on Sunday, Tuesday and Friday  [facility] RN CM updated her on care coordination of scooter 06/09/19 [facility] RN CM left a message for the RN of Dr Damita Dunnings at [phone] for possible assistance with a scooter. Plus [facility] and [facility] both recommend [facility] as the vendor for scooters  She is still not pleased with having to use [employer] for DME as she reports she does not like rental fee She wants to review with Dr Damita Dunnings at her next appointment which she believes is on 07/11/19 and then see if further assist is needed from [place] conditions  She reports she is doing well today and denies any s/s She is cheerful as usual and interacting with dialysis staff she has had 2 negative Covid tests prior to restarting dialysis at her regular center  Appointments ? [place] 07/11/19 01/04/20 0930 Dr Einar Gip Cardiology f/u  Plan [facility] RN CM will follow up with Destiny [facility] within the next 28-35 days Pt encouraged to return a call to [place] CM prn  Routed note to  MD/NP/PA  [facility] CM Care Plan Problem One     Most Recent Value  Care Plan Problem One  Risk for re admission related to PNA/Covid  Role Documenting the Problem One  Care Management Telephonic Coordinator  Care Plan for Problem One  Active  [facility]N Long Term Goal   Over the next 90 days, patient will not experience hospital readmission, as evidence by patient reporting and review of EMR during [facility] RN CCM outreach  [place] Term Goal Start Date  05/24/19  Interventions for Problem One Long Term Goal  assessed for s/s pt at dialysis and doing well 06/20/19  TH[facility] CM Short Term Goal #1   over the next 10 days patient will have her oxygen saturation checked at dialysis and document the finding to report during the next follow up call  [facility] CM Short Term Goal #1 Start Date  05/24/19  [facility] CM Short Term Goal #1 Met Date  05/30/19      Destiny Millin L. Lavina Hamman, RN, BSN, [place] Coordinator Office number [phone] Mobile number [phone]  Main [facility] number [phone] Fax number [phone]

## 2019-06-23 DIAGNOSIS — D509 Iron deficiency anemia, unspecified: Secondary | ICD-10-CM | POA: Diagnosis not present

## 2019-06-23 DIAGNOSIS — Z992 Dependence on renal dialysis: Secondary | ICD-10-CM | POA: Diagnosis not present

## 2019-06-23 DIAGNOSIS — E876 Hypokalemia: Secondary | ICD-10-CM | POA: Diagnosis not present

## 2019-06-23 DIAGNOSIS — D631 Anemia in chronic kidney disease: Secondary | ICD-10-CM | POA: Diagnosis not present

## 2019-06-23 DIAGNOSIS — N186 End stage renal disease: Secondary | ICD-10-CM | POA: Diagnosis not present

## 2019-06-23 DIAGNOSIS — N2581 Secondary hyperparathyroidism of renal origin: Secondary | ICD-10-CM | POA: Diagnosis not present

## 2019-06-25 ENCOUNTER — Emergency Department (HOSPITAL_COMMUNITY): Payer: Medicare Other

## 2019-06-25 ENCOUNTER — Inpatient Hospital Stay (HOSPITAL_COMMUNITY)
Admission: EM | Admit: 2019-06-25 | Discharge: 2019-06-27 | DRG: 291 | Disposition: A | Discharge status: Home / Self Care | Payer: Medicare Other | Attending: Family Medicine | Admitting: Family Medicine

## 2019-06-25 ENCOUNTER — Other Ambulatory Visit: Payer: Self-pay

## 2019-06-25 DIAGNOSIS — Z87891 Personal history of nicotine dependence: Secondary | ICD-10-CM

## 2019-06-25 DIAGNOSIS — N186 End stage renal disease: Secondary | ICD-10-CM | POA: Diagnosis present

## 2019-06-25 DIAGNOSIS — E1122 Type 2 diabetes mellitus with diabetic chronic kidney disease: Secondary | ICD-10-CM | POA: Diagnosis present

## 2019-06-25 DIAGNOSIS — E1169 Type 2 diabetes mellitus with other specified complication: Secondary | ICD-10-CM | POA: Diagnosis present

## 2019-06-25 DIAGNOSIS — N2581 Secondary hyperparathyroidism of renal origin: Secondary | ICD-10-CM | POA: Diagnosis not present

## 2019-06-25 DIAGNOSIS — E785 Hyperlipidemia, unspecified: Secondary | ICD-10-CM | POA: Diagnosis present

## 2019-06-25 DIAGNOSIS — Z992 Dependence on renal dialysis: Secondary | ICD-10-CM

## 2019-06-25 DIAGNOSIS — J9600 Acute respiratory failure, unspecified whether with hypoxia or hypercapnia: Secondary | ICD-10-CM | POA: Diagnosis present

## 2019-06-25 DIAGNOSIS — Z90710 Acquired absence of both cervix and uterus: Secondary | ICD-10-CM

## 2019-06-25 DIAGNOSIS — I120 Hypertensive chronic kidney disease with stage 5 chronic kidney disease or end stage renal disease: Secondary | ICD-10-CM | POA: Diagnosis not present

## 2019-06-25 DIAGNOSIS — I152 Hypertension secondary to endocrine disorders: Secondary | ICD-10-CM | POA: Diagnosis present

## 2019-06-25 DIAGNOSIS — R0600 Dyspnea, unspecified: Secondary | ICD-10-CM | POA: Diagnosis not present

## 2019-06-25 DIAGNOSIS — E1151 Type 2 diabetes mellitus with diabetic peripheral angiopathy without gangrene: Secondary | ICD-10-CM | POA: Diagnosis present

## 2019-06-25 DIAGNOSIS — I5033 Acute on chronic diastolic (congestive) heart failure: Secondary | ICD-10-CM | POA: Diagnosis present

## 2019-06-25 DIAGNOSIS — E8889 Other specified metabolic disorders: Secondary | ICD-10-CM | POA: Diagnosis present

## 2019-06-25 DIAGNOSIS — I10 Essential (primary) hypertension: Secondary | ICD-10-CM | POA: Diagnosis not present

## 2019-06-25 DIAGNOSIS — D631 Anemia in chronic kidney disease: Secondary | ICD-10-CM | POA: Diagnosis present

## 2019-06-25 DIAGNOSIS — F419 Anxiety disorder, unspecified: Secondary | ICD-10-CM | POA: Diagnosis present

## 2019-06-25 DIAGNOSIS — I132 Hypertensive heart and chronic kidney disease with heart failure and with stage 5 chronic kidney disease, or end stage renal disease: Secondary | ICD-10-CM | POA: Diagnosis present

## 2019-06-25 DIAGNOSIS — Z8619 Personal history of other infectious and parasitic diseases: Secondary | ICD-10-CM

## 2019-06-25 DIAGNOSIS — R0902 Hypoxemia: Secondary | ICD-10-CM | POA: Diagnosis not present

## 2019-06-25 DIAGNOSIS — R0602 Shortness of breath: Secondary | ICD-10-CM | POA: Diagnosis not present

## 2019-06-25 DIAGNOSIS — E1121 Type 2 diabetes mellitus with diabetic nephropathy: Secondary | ICD-10-CM | POA: Diagnosis present

## 2019-06-25 DIAGNOSIS — Z79899 Other long term (current) drug therapy: Secondary | ICD-10-CM

## 2019-06-25 DIAGNOSIS — E1159 Type 2 diabetes mellitus with other circulatory complications: Secondary | ICD-10-CM | POA: Diagnosis present

## 2019-06-25 DIAGNOSIS — Z7951 Long term (current) use of inhaled steroids: Secondary | ICD-10-CM | POA: Diagnosis not present

## 2019-06-25 DIAGNOSIS — Z79891 Long term (current) use of opiate analgesic: Secondary | ICD-10-CM

## 2019-06-25 DIAGNOSIS — Z833 Family history of diabetes mellitus: Secondary | ICD-10-CM

## 2019-06-25 DIAGNOSIS — M810 Age-related osteoporosis without current pathological fracture: Secondary | ICD-10-CM | POA: Diagnosis present

## 2019-06-25 DIAGNOSIS — Z800 Family history of malignant neoplasm of digestive organs: Secondary | ICD-10-CM

## 2019-06-25 DIAGNOSIS — I499 Cardiac arrhythmia, unspecified: Secondary | ICD-10-CM | POA: Diagnosis not present

## 2019-06-25 DIAGNOSIS — J45909 Unspecified asthma, uncomplicated: Secondary | ICD-10-CM | POA: Diagnosis present

## 2019-06-25 DIAGNOSIS — E876 Hypokalemia: Secondary | ICD-10-CM | POA: Diagnosis present

## 2019-06-25 DIAGNOSIS — J9601 Acute respiratory failure with hypoxia: Secondary | ICD-10-CM | POA: Diagnosis present

## 2019-06-25 DIAGNOSIS — I480 Paroxysmal atrial fibrillation: Secondary | ICD-10-CM | POA: Diagnosis present

## 2019-06-25 DIAGNOSIS — K219 Gastro-esophageal reflux disease without esophagitis: Secondary | ICD-10-CM | POA: Diagnosis present

## 2019-06-25 DIAGNOSIS — Z794 Long term (current) use of insulin: Secondary | ICD-10-CM

## 2019-06-25 DIAGNOSIS — G4733 Obstructive sleep apnea (adult) (pediatric): Secondary | ICD-10-CM | POA: Diagnosis present

## 2019-06-25 DIAGNOSIS — J449 Chronic obstructive pulmonary disease, unspecified: Secondary | ICD-10-CM | POA: Diagnosis present

## 2019-06-25 DIAGNOSIS — I160 Hypertensive urgency: Secondary | ICD-10-CM | POA: Diagnosis present

## 2019-06-25 DIAGNOSIS — J810 Acute pulmonary edema: Secondary | ICD-10-CM | POA: Diagnosis not present

## 2019-06-25 DIAGNOSIS — Z7982 Long term (current) use of aspirin: Secondary | ICD-10-CM

## 2019-06-25 DIAGNOSIS — Z9119 Patient's noncompliance with other medical treatment and regimen: Secondary | ICD-10-CM

## 2019-06-25 LAB — BASIC METABOLIC PANEL
Anion gap: 15 (ref 5–15)
BUN: 31 mg/dL — ABNORMAL HIGH (ref 8–23)
CO2: 26 mmol/L (ref 22–32)
Calcium: 8.5 mg/dL — ABNORMAL LOW (ref 8.9–10.3)
Chloride: 92 mmol/L — ABNORMAL LOW (ref 98–111)
Creatinine, Ser: 7.24 mg/dL — ABNORMAL HIGH (ref 0.44–1.00)
GFR calc Af Amer: 6 mL/min — ABNORMAL LOW (ref >60)
GFR calc non Af Amer: 5 mL/min — ABNORMAL LOW (ref >60)
Glucose, Bld: 117 mg/dL — ABNORMAL HIGH (ref 70–99)
Potassium: 3.2 mmol/L — ABNORMAL LOW (ref 3.5–5.1)
Sodium: 133 mmol/L — ABNORMAL LOW (ref 135–145)

## 2019-06-25 LAB — CBC WITH DIFFERENTIAL/PLATELET
Abs Immature Granulocytes: 0.03 10*3/uL (ref 0.00–0.07)
Basophils Absolute: 0 10*3/uL (ref 0.0–0.1)
Basophils Relative: 1 %
Eosinophils Absolute: 0.3 10*3/uL (ref 0.0–0.5)
Eosinophils Relative: 4 %
HCT: 39.1 % (ref 36.0–46.0)
Hemoglobin: 12.5 g/dL (ref 12.0–15.0)
Immature Granulocytes: 0 %
Lymphocytes Relative: 24 %
Lymphs Abs: 1.8 10*3/uL (ref 0.7–4.0)
MCH: 31.1 pg (ref 26.0–34.0)
MCHC: 32 g/dL (ref 30.0–36.0)
MCV: 97.3 fL (ref 80.0–100.0)
Monocytes Absolute: 0.3 10*3/uL (ref 0.1–1.0)
Monocytes Relative: 5 %
Neutro Abs: 5 10*3/uL (ref 1.7–7.7)
Neutrophils Relative %: 66 %
Platelets: 315 10*3/uL (ref 150–400)
RBC: 4.02 MIL/uL (ref 3.87–5.11)
RDW: 13.9 % (ref 11.5–15.5)
WBC: 7.6 10*3/uL (ref 4.0–10.5)
nRBC: 0 % (ref 0.0–0.2)

## 2019-06-25 LAB — POC SARS CORONAVIRUS 2 AG -  ED: SARS Coronavirus 2 Ag: NEGATIVE

## 2019-06-25 MED ORDER — CHLORHEXIDINE GLUCONATE CLOTH 2 % EX PADS
6.0000 | MEDICATED_PAD | Freq: Every day | CUTANEOUS | Status: DC
  Administered 2019-06-27: 6 via TOPICAL

## 2019-06-25 MED ORDER — SODIUM CHLORIDE 0.9% FLUSH
3.0000 mL | Freq: Two times a day (BID) | INTRAVENOUS | Status: DC
  Administered 2019-06-26 (×2): 3 mL via INTRAVENOUS

## 2019-06-25 MED ORDER — HEPARIN SODIUM (PORCINE) 5000 UNIT/ML IJ SOLN
5000.0000 [IU] | Freq: Three times a day (TID) | INTRAMUSCULAR | Status: DC
  Administered 2019-06-26 – 2019-06-27 (×5): 5000 [IU] via SUBCUTANEOUS
  Filled 2019-06-25 (×5): qty 1

## 2019-06-25 MED ORDER — ONDANSETRON HCL 4 MG PO TABS: 4.0000 mg | ORAL_TABLET | Freq: Four times a day (QID) | ORAL | Status: DC | PRN

## 2019-06-25 MED ORDER — ACETAMINOPHEN 650 MG RE SUPP: 650.0000 mg | Freq: Four times a day (QID) | RECTAL | Status: DC | PRN

## 2019-06-25 MED ORDER — ACETAMINOPHEN 325 MG PO TABS: 650.0000 mg | ORAL_TABLET | Freq: Four times a day (QID) | ORAL | Status: DC | PRN

## 2019-06-25 MED ORDER — ONDANSETRON HCL 4 MG/2ML IJ SOLN: 4.0000 mg | Freq: Four times a day (QID) | INTRAMUSCULAR | Status: DC | PRN

## 2019-06-25 NOTE — ED Provider Notes (Signed)
[facility] EMERGENCY DEPARTMENT Provider Note   CSN: [id number] Arrival date & time: 06/25/19  2026     History   Chief Complaint Chief Complaint  Patient presents with   Shortness of Breath    HPI Destiny Day is a 68 y.o. female.     Patient is a 68 year old female with a history of paroxysmal atrial fibrillation, diabetes, CHF, end-stage renal disease with dialysis Monday Wednesday Friday presenting today with shortness of breath.  Patient last dialyzed on Friday a full session and was doing well.  She has not missed any dialysis.  Today she woke up coughing throughout the day and became more more short of breath this afternoon until this evening.  She tried using her inhalers at home and they were not working and she was starting to gasp for air so called EMS.  Patient has not had a productive cough or a fever today.  She has had no chest pain or vomiting.  Patient has had Covid and required 1 week admission at the end of October.  She said the last 2 Covid test she is taken within the last few weeks have been negative and she was able to go back to her normal dialysis center.  The history is provided by the patient.  Shortness of Breath Severity:  Severe Onset quality:  Gradual Duration:  6 hours Timing:  Constant Progression:  Worsening Chronicity:  New Context comment:  Patient states she was coughing all day and it was nonproductive and shortness of breath got a lot worse this afternoon Relieved by:  Nothing Worsened by:  Activity and coughing Ineffective treatments:  Inhaler Associated symptoms: cough   Associated symptoms: no abdominal pain, no chest pain, no fever, no sputum production, no vomiting and no wheezing   Risk factors comment:  History of CHF, diabetes, end-stage renal disease dialyzes Monday Wednesday Friday, asthma   Past Medical History:  Diagnosis Date   Abdominal bruit    Anemia    Anxiety    Arthritis    Osteoarthritis   Asthma    Cervical disc disease    "pinced nerve"   CHF (congestive heart failure) ([place])    Complication of anesthesia    " after I got home from my last procedure, I started itching."   Diabetes mellitus    Type II   Diverticulitis    ESRD (end stage renal disease) ([place])    dialysis - M/W/F- [place]   GERD (gastroesophageal reflux disease)    from medications   GI bleed 03/31/2013   Head injury 07/2017   History of hiatal hernia    Hyperlipidemia    Hypertension    Neuropathy    left leg   Osteoporosis    Peripheral vascular disease ([place])    Pneumonia    "very young" and a few years ago   PONV (postoperative nausea and vomiting)    Seasonal allergies    Shortness of breath dyspnea    WIth exertion, when fluid builds   Sleep apnea    can't afford cpap     Patient Active Problem List   Diagnosis Date Noted   Paroxysmal atrial fibrillation ([place]) 06/13/2019   CAP (community acquired pneumonia) 05/17/2019   Fluid overload 06/18/2018   Old bucket handle tear of lateral meniscus of right knee    Old peripheral tear of medial meniscus of right knee    Septic arthritis of knee, right ([place]) 05/06/2018   Degenerative tear of  posterior horn of medial meniscus, right    Old complex tear of lateral meniscus of right knee    Loose body in knee, right    Acute pulmonary edema ([place]) 02/21/2018   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[place]) 02/03/2018   End-stage renal disease on hemodialysis (HCC)    Respiratory failure ([place]) 01/21/2018   Achilles tendon contracture, right 11/09/2017   Labile blood glucose    Anemia of infection and chronic disease    Essential hypertension    Black stool    Right sided weakness    Subdural hemorrhage following injury ([place]) 08/30/2017   Diabetic peripheral neuropathy (HCC)    Chronic obstructive pulmonary disease (HCC)    Seizure prophylaxis    Subdural hematoma ([place])  08/24/2017   Traumatic subdural hematoma ([place]) 08/23/2017   Fall    SDH (subdural hematoma) ([place])    Acute respiratory failure with hypoxia ([place])    Diabetes mellitus type 2 in nonobese ([place])    Leukocytosis    Labile blood pressure    Generalized anxiety disorder    Debility 08/16/2017   Amputated great toe of right foot ([place])    Amputated toe of left foot ([place])    Post-operative pain    ESRD on dialysis ([place])    Diabetes mellitus ([place])    Anxiety state    Benign essential HTN    Acute blood loss anemia    Anemia of chronic disease    Diarrhea, unspecified 01/16/2017   Dependence on renal dialysis ([place]) 01/06/2017   Idiopathic chronic venous hypertension of both lower extremities with inflammation 10/13/2016   S/P transmetatarsal amputation of foot, left ([place]) 09/21/2016   Onychomycosis 08/15/2016   Age-related osteoporosis without current pathological fracture 07/11/2016   Cervical disc disorder, unspecified, unspecified cervical region 07/11/2016   Diverticulitis of intestine, part unspecified, without perforation or abscess without bleeding 07/11/2016   Unspecified abdominal hernia without obstruction or gangrene 07/11/2016   PAD (peripheral artery disease) ([place]) 07/08/2016   Cellulitis of left toe 06/29/2016   Atherosclerosis of native artery of left lower extremity with gangrene ([place]) [birth year]1950   Complication of vascular dialysis catheter 02/29/2016   Coagulation defect, unspecified ([place]) 10/16/2015   GERD (gastroesophageal reflux disease) 10/07/2015   ARF (acute renal failure) ([place])    Hypothermia 06/12/2015   Acute on chronic renal failure ([place]) 06/12/2015   CHF (congestive heart failure) ([place]) 07/30/2014   CKD (chronic kidney disease) stage 4, GFR 15-29 ml/min (HCC) 06/27/2014   Hypertensive heart disease 05/25/2013   CKD (chronic kidney disease) stage 3, GFR 30-59 ml/min 05/25/2013   Pulmonary edema 05/23/2013   Type 2  diabetes mellitus with diabetic nephropathy ([place]) 05/23/2013   Type 2 diabetes mellitus with hyperosmolar nonketotic hyperglycemia ([place]) 04/13/2013   Acute on chronic diastolic heart failure ([place]) 04/13/2013   UGI bleed 03/31/2013   Hypokalemia 08/24/2012   Type II or unspecified type diabetes mellitus without mention of complication, not stated as uncontrolled 12/27/2007   HLD (hyperlipidemia) 12/27/2007   Allergic rhinitis due to pollen 12/27/2007   Asthma 12/27/2007    Past Surgical History:  Procedure Laterality Date   A/V SHUNTOGRAM N/A 09/22/2016   Procedure: A/V Shuntogram - left arm;  Surgeon: Serafina Mitchell, MD;  Location: [facility];  Service: Cardiovascular;  Laterality: N/A;   A/V SHUNTOGRAM N/A 03/22/2018   Procedure: A/V SHUNTOGRAM - left arm;  Surgeon: Serafina Mitchell, MD;  Location: [facility];  Service: Cardiovascular;  Laterality: N/A;   A/V SHUNTOGRAM Left 11/17/2018   Procedure: A/V SHUNTOGRAM;  Surgeon: Angelia Mould, MD;  Location: [facility];  Service: Cardiovascular;  Laterality: Left;   ABDOMINAL HYSTERECTOMY  1993`   AMPUTATION Left 09/01/2016   Procedure: LEFT FOOT TRANSMETATARSAL AMPUTATION;  Surgeon: Newt Minion, MD;  Location: [facility];  Service: Orthopedics;  Laterality: Left;   AMPUTATION Right 08/11/2017   Procedure: RIGHT GREAT TOE AMPUTATION DIGIT;  Surgeon: Rosetta Posner, MD;  Location: [facility];  Service: Vascular;  Laterality: Right;   AMPUTATION Right 12/31/2017   Procedure: RIGHT TRANSMETATARSAL AMPUTATION;  Surgeon: Newt Minion, MD;  Location: [facility];  Service: Orthopedics;  Laterality: Right;   AV FISTULA PLACEMENT Left 04/21/2016   Procedure: INSERTION OF ARTERIOVENOUS (AV) GORE-TEX GRAFT ARM LEFT;  Surgeon: Elam Dutch, MD;  Location: [facility];  Service: Vascular;  Laterality: Left;   BASCILIC VEIN TRANSPOSITION Left 07/10/2014   Procedure: BASCILIC VEIN TRANSPOSITION;  Surgeon: Angelia Mould,  MD;  Location: [facility];  Service: Vascular;  Laterality: Left;   [place] Right 11/08/2014   Procedure: FIRST STAGE BASILIC VEIN TRANSPOSITION;  Surgeon: Angelia Mould, MD;  Location: [facility];  Service: Vascular;  Laterality: Right;   [place] Right 01/18/2015   Procedure: SECOND STAGE BASILIC VEIN TRANSPOSITION;  Surgeon: Angelia Mould, MD;  Location: [facility];  Service: Vascular;  Laterality: Right;   CRANIOTOMY N/A 08/23/2017   Procedure: CRANIOTOMY HEMATOMA EVACUATION SUBDURAL;  Surgeon: Ashok Pall, MD;  Location: [facility];  Service: Neurosurgery;  Laterality: N/A;   CRANIOTOMY Left 08/24/2017   Procedure: CRANIOTOMY FOR RECURRENT ACUTE SUBDURAL HEMATOMA;  Surgeon: Ashok Pall, MD;  Location: [facility];  Service: Neurosurgery;  Laterality: Left;   ESOPHAGOGASTRODUODENOSCOPY N/A 03/31/2013   Procedure: ESOPHAGOGASTRODUODENOSCOPY (EGD);  Surgeon: Gatha Mayer, MD;  Location: [facility] ENDOSCOPY;  Service: Endoscopy;  Laterality: N/A;   EYE SURGERY     laser surgery   FEMORAL-POPLITEAL BYPASS GRAFT Left 07/08/2016   Procedure: LEFT  FEMORAL-BELOW KNEE POPLITEAL ARTERY BYPASS GRAFT USING 6MM X 80 CM PROPATEN GORETEX GRAFT WITH RINGS.;  Surgeon: Rosetta Posner, MD;  Location: [facility] OR;  Service: Vascular;  Laterality: Left;   FEMORAL-POPLITEAL BYPASS GRAFT Right 08/11/2017   Procedure: RIGHT FEMORAL TO BELOW KNEE POPLITEAL ARTERKY  BYPASS GRAFT USING 6MM RINGED PROPATEN GRAFT;  Surgeon: Rosetta Posner, MD;  Location: [facility];  Service: Vascular;  Laterality: Right;   FISTULOGRAM Left 10/29/2014   Procedure: FISTULOGRAM;  Surgeon: Angelia Mould, MD;  Location: [facility] CATH LAB;  Service: Cardiovascular;  Laterality: Left;   GAS/FLUID EXCHANGE Left 12/20/2018   Procedure: AIR GAS EXCHANGE;  Surgeon: Jalene Mullet, MD;  Location: [facility];  Service: Ophthalmology;  Laterality: Left;   KNEE ARTHROSCOPY Right 05/06/2018   Procedure: RIGHT KNEE ARTHROSCOPY;  Surgeon:  Newt Minion, MD;  Location: [facility];  Service: Orthopedics;  Laterality: Right;   KNEE ARTHROSCOPY Right 06/10/2018   Procedure: RIGHT KNEE ARTHROSCOPY AND DEBRIDEMENT;  Surgeon: Newt Minion, MD;  Location: [facility];  Service: Orthopedics;  Laterality: Right;   LIGATION OF ARTERIOVENOUS  FISTULA Left 04/21/2016   Procedure: LIGATION OF ARTERIOVENOUS  FISTULA LEFT ARM;  Surgeon: Elam Dutch, MD;  Location: [facility];  Service: Vascular;  Laterality: Left;   LOWER EXTREMITY ANGIOGRAPHY N/A 04/06/2017   Procedure: Lower Extremity Angiography - Right;  Surgeon: Serafina Mitchell, MD;  Location: [facility];  Service:  Cardiovascular;  Laterality: N/A;   MEMBRANE PEEL Left 12/20/2018   Procedure: MEMBRANE PEEL;  Surgeon: Jalene Mullet, MD;  Location: [facility];  Service: Ophthalmology;  Laterality: Left;   PATCH ANGIOPLASTY Right 01/18/2015   Procedure: BASILIC VEIN PATCH ANGIOPLASTY USING VASCUGUARD PATCH;  Surgeon: Angelia Mould, MD;  Location: [facility];  Service: Vascular;  Laterality: Right;   PERIPHERAL VASCULAR BALLOON ANGIOPLASTY  09/22/2016   Procedure: Peripheral Vascular Balloon Angioplasty;  Surgeon: Serafina Mitchell, MD;  Location: [facility];  Service: Cardiovascular;;  Lt. Fistula   PERIPHERAL VASCULAR BALLOON ANGIOPLASTY Left 03/22/2018   Procedure: PERIPHERAL VASCULAR BALLOON ANGIOPLASTY;  Surgeon: Serafina Mitchell, MD;  Location: [facility];  Service: Cardiovascular;  Laterality: Left;  Arm shunt   PERIPHERAL VASCULAR CATHETERIZATION N/A 06/23/2016   Procedure: Abdominal Aortogram w/Lower Extremity;  Surgeon: Serafina Mitchell, MD;  Location: [facility];  Service: Cardiovascular;  Laterality: N/A;   PERIPHERAL VASCULAR CATHETERIZATION  06/23/2016   Procedure: Peripheral Vascular Intervention;  Surgeon: Serafina Mitchell, MD;  Location: [facility];  Service: Cardiovascular;;  lt common and external illiac artery   PHOTOCOAGULATION WITH LASER Left  12/20/2018   Procedure: PHOTOCOAGULATION WITH LASER;  Surgeon: Jalene Mullet, MD;  Location: [facility];  Service: Ophthalmology;  Laterality: Left;   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      Home Medications    Prior to Admission medications   Medication Sig Start Date End Date Taking? Authorizing Provider  albuterol (PROAIR HFA) 108 (90 Base) MCG/ACT inhaler Inhale 1-2 puffs into the lungs every 6 (six) hours as needed for wheezing or shortness of breath. 06/13/19   Spero Geralds, MD  amLODipine (NORVASC) 10 MG tablet Take 10 mg by mouth at bedtime.    [provider]  aspirin EC 81 MG tablet Take 81 mg by mouth daily.    [provider]  atorvastatin (LIPITOR) 20 MG tablet TAKE 1 TABLET BY MOUTH EVERY DAY Patient taking differently: Take 20 mg by mouth daily at 6 PM.  04/11/19   Adrian Prows, MD  budesonide-formoterol (SYMBICORT) 160-4.5 MCG/ACT inhaler Inhale 1 puff into the lungs daily as needed (respiratory issues.).     [provider]  carvedilol (COREG) 12.5 MG tablet Take 12.5 mg by mouth See admin instructions. Take 1 tablet (12.5 mg) by mouth once daily in the evening on Mondays, Wednesdays, & Fridays. (Dialysis) Take 1 tablet (12.5 mg) by mouth twice daily on Sundays, Tuesdays, Thursdays, & Saturdays. (Non Dialysis)    [provider]  cetirizine (ZYRTEC) 10 MG tablet Take 1 tablet (10 mg total) by mouth at bedtime. 06/13/19   Spero Geralds, MD  cinacalcet (SENSIPAR) 60 MG tablet Take 60 mg by mouth daily. Take 1 tablet (90 mg) by mouth in the evening on Mondays, Wednesdays, & Fridays (dialysis) Take 1 tablet (90 mg) by mouth twice daily on Sundays, Tuesdays, Thursdays, Saturdays. (non dialysis) 11/28/18   [provider]  diazepam (VALIUM) 5 MG tablet Take 5 mg by mouth 2 (two) times daily as needed for anxiety.    [provider]  ferric citrate (AURYXIA) 1 GM 210 MG(Fe) tablet Take 210-420 mg by mouth See admin instructions. Take 2 tablets (  420 mg) by mouth with each meal & take 1 tablet (210 mg) by mouth with snacks.    [provider]  fluticasone (FLONASE) 50 MCG/ACT nasal spray Place 1 spray into both nostrils daily as needed for allergies or rhinitis.    [provider]  gabapentin (NEURONTIN) 100 MG capsule TAKE 1 CAPSULE (100 MG TOTAL) BY MOUTH AT BEDTIME. WHEN NECESSARY FOR NEUROPATHY PAIN 12/13/18   Newt Minion, MD  hydrocerin (EUCERIN) CREA Apply 062 application topically 2 (two) times daily. Patient taking differently: Apply 1 application topically 2 (two) times daily.  09/16/17   Love, Ivan Anchors, PA-C  HYDROcodone-homatropine (HYCODAN) 5-1.5 MG/5ML syrup Take 5 mLs by mouth every 8 (eight) hours as needed for cough. 05/29/19   [provider]  insulin regular (NOVOLIN R,HUMULIN R) 100 units/mL injection Inject 3-8 Units into the skin See admin instructions. 3 times daily with meals per sliding scale except on Dialysis days, Mon, Wed, Fri    [provider]  isosorbide-hydrALAZINE (BIDIL) 20-37.5 MG tablet Take 1 tablet by mouth 3 (three) times daily. On NON dialysis days pt takes 1 tab 3 times daily, and on Dialysis days, pt takes 1 tab every evning Patient taking differently: Take 1 tablet by mouth See admin instructions. Take 1 tablet by mouth in the evening on Mondays, Wednesdays, & Fridays. (Dialysis) Take 1 tablet 3 times daily on Sundays, Tuesdays, Thursdays, & Saturdays. (Non-Dialysis) 12/06/18   Miquel Dunn, NP  lidocaine-prilocaine (EMLA) cream Apply 1 application topically 3 (three) times a week. 1-2 hours prior to dialysis 01/07/18   [provider]  loratadine (CLARITIN) 10 MG tablet Take 10 mg by mouth daily as needed  for allergies.    [provider]  multivitamin (RENA-VIT) TABS tablet Take 1 tablet by mouth daily.  01/24/15   [provider]  omeprazole (PRILOSEC) 20 MG capsule Take 20 mg by mouth daily.     [provider]  ondansetron (ZOFRAN) 4 MG tablet Take 1 tablet (4 mg total) by mouth every 6 (six) hours as needed for nausea. 05/22/19   Aline August, MD  senna (SENOKOT) 8.6 MG tablet Take 1 tablet by mouth as needed for constipation.    [provider]  Vitamin D, Ergocalciferol, (DRISDOL) 1.25 MG (50000 UT) CAPS capsule Take 50,000 Units by mouth every Sunday. Takes on Sunday     [provider]    Family History Family History  Problem Relation Age of Onset   Other Mother        not sure of cause of death   Diabetes Father    Pancreatic cancer Maternal Grandmother    Colon cancer Neg Hx     Social History Social History   Tobacco Use   Smoking status: Former Smoker    Packs/day: 0.35    Years: 40.00    Pack years: 14.00    Types: Cigarettes    Quit date: 05/10/2012    Years since quitting: 7.1   Smokeless tobacco: Never Used  Substance Use Topics   Alcohol use: No   Drug use: No    Comment: marijuana; quit in early 1980's     Allergies   Adhesive  [tape], Lisinopril, and Prednisone   Review of Systems Review of Systems  Constitutional: Negative for fever.  Respiratory: Positive for cough and shortness of breath. Negative for sputum production and wheezing.   Cardiovascular: Negative for chest pain.  Gastrointestinal: Negative for abdominal pain and vomiting.  All other systems reviewed and are negative.    Physical Exam Updated Vital Signs BP (!) 190/85 (BP Location: Right Arm)    Pulse (!) 110    Temp 98.1 F (36.7 C) (Oral)    Resp (!) 36    Ht 5\' 5"  (1.651 m)    Wt 68 kg    SpO2 100%    BMI 24.95 kg/m   Physical Exam Vitals signs and nursing note reviewed.  Constitutional:      General: She is in acute  distress.     Appearance: She is well-developed and normal weight.  HENT:     Head: Normocephalic and atraumatic.  Eyes:     Conjunctiva/sclera: Conjunctivae normal.     Pupils: Pupils are equal, round, and reactive to light.  Neck:     Musculoskeletal: Normal range of motion and neck supple.  Cardiovascular:     Rate and Rhythm: Normal rate and regular rhythm.     Heart sounds: No murmur.  Pulmonary:     Effort: Tachypnea and accessory muscle usage present. No respiratory distress.     Breath sounds: Examination of the right-lower field reveals rales. Examination of the left-lower field reveals rales. Rales present. No wheezing.     Comments: Can only speak in 1-2 word sentences Abdominal:     General: There is no distension.     Palpations: Abdomen is soft.     Tenderness: There is no abdominal tenderness. There is no guarding or rebound.  Musculoskeletal: Normal range of motion.        General: No tenderness.     Right lower leg: No edema.     Left lower leg: No edema.  Skin:    General: Skin is warm and dry.     Findings: No erythema or rash.  Neurological:     General: No focal deficit present.     Mental Status: She is alert and oriented to person, place, and time. Mental status is at baseline.  Psychiatric:        Mood and Affect: Mood normal.        Behavior: Behavior normal.      ED Treatments / Results  Labs (all labs ordered are listed, but only abnormal results are displayed) Labs Reviewed  BASIC METABOLIC PANEL - Abnormal; Notable for the following components:      Result Value   Sodium 133 (*)    Potassium 3.2 (*)    Chloride 92 (*)    Glucose, Bld 117 (*)    BUN 31 (*)    Creatinine, Ser 7.24 (*)    Calcium 8.5 (*)    GFR calc non Af Amer 5 (*)    GFR calc Af Amer 6 (*)    All other components within normal limits  SARS CORONAVIRUS 2 (TAT 6-24 HRS)  CBC WITH DIFFERENTIAL/PLATELET  CBC  RENAL FUNCTION PANEL  HEMOGLOBIN A1C  POC SARS CORONAVIRUS  2 AG -  ED    EKG EKG Interpretation  Date/Time:  Sunday June 25 2019 20:34:59 EST Ventricular Rate:  119 PR Interval:    QRS Duration: 90 QT Interval:  361 QTC Calculation: 457 R Axis:   50 Text Interpretation: Sinus tachycardia new Atrial premature complexes Borderline repolarization abnormality Confirmed by Blanchie Dessert ([id number]) on 06/25/2019 8:58:04 PM   Radiology Dg Chest Port 1 View  Result Date: 06/25/2019 CLINICAL DATA:  68 year old female with acute shortness of breath tonight. Dialysis patient. Status post COVID-19 in October. EXAM:  PORTABLE CHEST 1 VIEW COMPARISON:  Chest CTA 06/18/2018. 05/17/2019 portable chest and earlier. FINDINGS: Portable AP upright view at 2106 hours. Continued small bilateral pleural effusions. Basilar predominant increased pulmonary interstitial opacity greater than that in October. No consolidation or pneumothorax. Stable cardiac size and mediastinal contours. Calcified aortic atherosclerosis. Visualized tracheal air column is within normal limits. Overall ventilation appears stable from the recent CTA. Paucity of bowel gas in the upper abdomen. No acute osseous abnormality identified. IMPRESSION: Suspected pulmonary edema and small pleural effusions not significantly changed from the CTA on 06/18/2018. Electronically Signed   By: Genevie Ann M.D.   On: 06/25/2019 21:20    Procedures Procedures (including critical care time)  Medications Ordered in ED Medications - No data to display   Initial Impression / Assessment and Plan / ED Course  I have reviewed the triage vital signs and the nursing notes.  Pertinent labs & imaging results that were available during my care of the patient were reviewed by me and considered in my medical decision making (see chart for details).       68 year old female multiple medical problems presenting with respiratory distress.  Patient has a history of having Covid approximately 1 month ago and had just  recently had 2 - test and was able to return to her dialysis center.  She was coughing today but nonproductive cough and no fever.  Shortness of breath worsened as the day progressed.  Patient is currently only speaking in short sentences and appears breathless.  Patient seems to be tolerating high flow nasal cannula and at this time oxygen saturation remains 100%.  Patient's chest x-ray is consistent with fluid overload but nothing to suggest Covid pneumonia.  CBC within normal limits and potassium is stable.  Spoke with Dr. Burnett Sheng will plan on dialyzing the patient.  Patient will be admitted to the medicine service.  CRITICAL CARE Performed by: Breelle Hollywood Total critical care time: 30 minutes Critical care time was exclusive of separately billable procedures and treating other patients. Critical care was necessary to treat or prevent imminent or life-threatening deterioration. Critical care was time spent personally by me on the following activities: development of treatment plan with patient and/or surrogate as well as nursing, discussions with consultants, evaluation of patient's response to treatment, examination of patient, obtaining history from patient or surrogate, ordering and performing treatments and interventions, ordering and review of laboratory studies, ordering and review of radiographic studies, pulse oximetry and re-evaluation of patient's condition.   Final Clinical Impressions(s) / ED Diagnoses   Final diagnoses:  ESRD (end stage renal disease) ([place])  Acute pulmonary edema [facility])    ED Discharge Orders    None       Blanchie Dessert, MD 06/26/19 [phone]

## 2019-06-25 NOTE — H&P (Signed)
History and Physical    Destiny Day WER:[id number] DOB: 1951/02/07 DOA: 06/25/2019  PCP: Velna Hatchet, MD  Patient coming from: Home  I have personally briefly reviewed patient's old medical records in [place]  Chief Complaint: Shortness of breath  HPI: Destiny Day is a 68 y.o. female with medical history significant for ESRD on MWF HD, asthma, insulin-dependent type 2 diabetes, hypertension, hyperlipidemia, PVD, anxiety, and OSA not currently using CPAP who presents to the ED for evaluation of shortness of breath.  Patient was recently hospitalized from 05/17/2019-05/22/2019 for COVID-19 pneumonia.  She was treated with a 5-day course of IV remdesivir.  She was not given steroids due to reported history of angioedema secondary to prednisone.  She was discharged to home on room air.  She has recently been released from her Covid HD unit and cleared to return to her usual dialysis center with last full dialysis session completed 06/23/2019.  Patient states she was feeling well until around 7:30 PM earlier today when she developed sudden onset shortness of breath while laying down.  Dyspnea was worsening with lying flat or exertion.  She does admit to increased fluid intake and salt intake for the last day.  She says her dry weight is 67 kg and she was 66.8 kg when she left dialysis on Friday.  She reports a chronic dry cough which was somewhat more frequent today.  She reports an episode of clear productive sputum but otherwise her cough has remained dry.  She has not had any chest pain or palpitations.  She has not seen any swelling in her legs.  She denies any nausea, vomiting, abdominal pain, or diarrhea.  ED Course:  Initial vitals showed BP 190/85, pulse 105, RR 36, temp 98.1 Fahrenheit, SPO2 100% on 2 L supplemental O2 via [place].  Patient was subsequently placed on high flow nasal cannula.  Labs are notable for BUN 31, creatinine 0.24, sodium 133, potassium 3.2,  bicarb 26, serum glucose 117, WBC 7.6, hemoglobin 12.5, platelets 315,000.  POC SARS-CoV-2 antigen test was negative.  Portable chest x-ray showed pulmonary edema with small pleural effusions.  EDP discussed with nephrology who will consult on patient and plan for hemodialysis in the morning.  The hospitalist service was consulted admit for further evaluation and management.  Review of Systems: All systems reviewed and are negative except as documented in history of present illness above.   Past Medical History:  Diagnosis Date  . Abdominal bruit   . Anemia   . Anxiety   . Arthritis    Osteoarthritis  . Asthma   . Cervical disc disease    "pinced nerve"  . CHF (congestive heart failure) ([place])   . Complication of anesthesia    " after I got home from my last procedure, I started itching."  . Diabetes mellitus    Type II  . Diverticulitis   . ESRD (end stage renal disease) ([employer])    dialysis - M/W/F- [place]  . GERD (gastroesophageal reflux disease)    from medications  . GI bleed 03/31/2013  . Head injury 07/2017  . History of hiatal hernia   . Hyperlipidemia   . Hypertension   . Neuropathy    left leg  . Osteoporosis   . Peripheral vascular disease ([place])   . Pneumonia    "very young" and a few years ago  . PONV (postoperative nausea and vomiting)   . Seasonal allergies   . Shortness of breath dyspnea  WIth exertion, when fluid builds  . Sleep apnea    can't afford cpap     Past Surgical History:  Procedure Laterality Date  . A/V SHUNTOGRAM N/A 09/22/2016   Procedure: A/V Shuntogram - left arm;  Surgeon: Serafina Mitchell, MD;  Location: [place] [facility];  Service: Cardiovascular;  Laterality: N/A;  . A/V SHUNTOGRAM N/A 03/22/2018   Procedure: A/V SHUNTOGRAM - left arm;  Surgeon: Serafina Mitchell, MD;  Location: [place] [facility];  Service: Cardiovascular;  Laterality: N/A;  . A/V SHUNTOGRAM Left 11/17/2018   Procedure: A/V SHUNTOGRAM;  Surgeon: Angelia Mould, MD;  Location: [place] [facility];  Service: Cardiovascular;  Laterality: Left;  . ABDOMINAL HYSTERECTOMY  1993`  . AMPUTATION Left 09/01/2016   Procedure: LEFT FOOT TRANSMETATARSAL AMPUTATION;  Surgeon: Newt Minion, MD;  Location: [facility];  Service: Orthopedics;  Laterality: Left;  . AMPUTATION Right 08/11/2017   Procedure: RIGHT GREAT TOE AMPUTATION DIGIT;  Surgeon: Rosetta Posner, MD;  Location: [place];  Service: Vascular;  Laterality: Right;  . AMPUTATION Right 12/31/2017   Procedure: RIGHT TRANSMETATARSAL AMPUTATION;  Surgeon: Newt Minion, MD;  Location: [place];  Service: Orthopedics;  Laterality: Right;  . AV FISTULA PLACEMENT Left 04/21/2016   Procedure: INSERTION OF ARTERIOVENOUS (AV) GORE-TEX GRAFT ARM LEFT;  Surgeon: Elam Dutch, MD;  Location: [place];  Service: Vascular;  Laterality: Left;  . [place] TRANSPOSITION Left 07/10/2014   Procedure: BASCILIC VEIN TRANSPOSITION;  Surgeon: Angelia Mould, MD;  Location: [place];  Service: Vascular;  Laterality: Left;  . BASCILIC VEIN TRANSPOSITION Right 11/08/2014   Procedure: FIRST STAGE BASILIC VEIN TRANSPOSITION;  Surgeon: Angelia Mould, MD;  Location: [place];  Service: Vascular;  Laterality: Right;  . BASCILIC VEIN TRANSPOSITION Right 01/18/2015   Procedure: SECOND STAGE BASILIC VEIN TRANSPOSITION;  Surgeon: Angelia Mould, MD;  Location: [facility];  Service: Vascular;  Laterality: Right;  . CRANIOTOMY N/A 08/23/2017   Procedure: CRANIOTOMY HEMATOMA EVACUATION SUBDURAL;  Surgeon: Ashok Pall, MD;  Location: [place];  Service: Neurosurgery;  Laterality: N/A;  . CRANIOTOMY Left 08/24/2017   Procedure: CRANIOTOMY FOR RECURRENT ACUTE SUBDURAL HEMATOMA;  Surgeon: Ashok Pall, MD;  Location: [place];  Service: Neurosurgery;  Laterality: Left;  . ESOPHAGOGASTRODUODENOSCOPY N/A 03/31/2013   Procedure: ESOPHAGOGASTRODUODENOSCOPY (EGD);  Surgeon: Gatha Mayer, MD;  Location: [facility] ENDOSCOPY;  Service: Endoscopy;   Laterality: N/A;  . EYE SURGERY     laser surgery  . FEMORAL-POPLITEAL BYPASS GRAFT Left 07/08/2016   Procedure: LEFT  FEMORAL-BELOW KNEE POPLITEAL ARTERY BYPASS GRAFT USING 6MM X 80 CM PROPATEN GORETEX GRAFT WITH RINGS.;  Surgeon: Rosetta Posner, MD;  Location: [place];  Service: Vascular;  Laterality: Left;  . FEMORAL-POPLITEAL BYPASS GRAFT Right 08/11/2017   Procedure: RIGHT FEMORAL TO BELOW KNEE POPLITEAL ARTERKY  BYPASS GRAFT USING 6MM RINGED PROPATEN GRAFT;  Surgeon: Rosetta Posner, MD;  Location: [facility];  Service: Vascular;  Laterality: Right;  . FISTULOGRAM Left 10/29/2014   Procedure: FISTULOGRAM;  Surgeon: Angelia Mould, MD;  Location: [facility] CATH LAB;  Service: Cardiovascular;  Laterality: Left;  Marland Kitchen GAS/FLUID EXCHANGE Left 12/20/2018   Procedure: AIR GAS EXCHANGE;  Surgeon: Jalene Mullet, MD;  Location: [place];  Service: Ophthalmology;  Laterality: Left;  . KNEE ARTHROSCOPY Right 05/06/2018   Procedure: RIGHT KNEE ARTHROSCOPY;  Surgeon: Newt Minion, MD;  Location: [place];  Service: Orthopedics;  Laterality: Right;  . KNEE ARTHROSCOPY Right 06/10/2018  Procedure: RIGHT KNEE ARTHROSCOPY AND DEBRIDEMENT;  Surgeon: Newt Minion, MD;  Location: [place];  Service: Orthopedics;  Laterality: Right;  . LIGATION OF ARTERIOVENOUS  FISTULA Left 04/21/2016   Procedure: LIGATION OF ARTERIOVENOUS  FISTULA LEFT ARM;  Surgeon: Elam Dutch, MD;  Location: [place];  Service: Vascular;  Laterality: Left;  . LOWER EXTREMITY ANGIOGRAPHY N/A 04/06/2017   Procedure: Lower Extremity Angiography - Right;  Surgeon: Serafina Mitchell, MD;  Location: [place] [facility];  Service: Cardiovascular;  Laterality: N/A;  . MEMBRANE PEEL Left 12/20/2018   Procedure: MEMBRANE PEEL;  Surgeon: Jalene Mullet, MD;  Location: [place];  Service: Ophthalmology;  Laterality: Left;  . PATCH ANGIOPLASTY Right 01/18/2015   Procedure: BASILIC VEIN PATCH ANGIOPLASTY USING VASCUGUARD PATCH;  Surgeon: Angelia Mould, MD;  Location:  [place];  Service: Vascular;  Laterality: Right;  . PERIPHERAL VASCULAR BALLOON ANGIOPLASTY  09/22/2016   Procedure: Peripheral Vascular Balloon Angioplasty;  Surgeon: Serafina Mitchell, MD;  Location: [place] [facility];  Service: Cardiovascular;;  Lt. Fistula  . PERIPHERAL VASCULAR BALLOON ANGIOPLASTY Left 03/22/2018   Procedure: PERIPHERAL VASCULAR BALLOON ANGIOPLASTY;  Surgeon: Serafina Mitchell, MD;  Location: [place] [facility];  Service: Cardiovascular;  Laterality: Left;  Arm shunt  . PERIPHERAL VASCULAR CATHETERIZATION N/A 06/23/2016   Procedure: Abdominal Aortogram w/Lower Extremity;  Surgeon: Serafina Mitchell, MD;  Location: [place] [facility];  Service: Cardiovascular;  Laterality: N/A;  . PERIPHERAL VASCULAR CATHETERIZATION  06/23/2016   Procedure: Peripheral Vascular Intervention;  Surgeon: Serafina Mitchell, MD;  Location: [place] [facility];  Service: Cardiovascular;;  lt common and external illiac artery  . PHOTOCOAGULATION WITH LASER Left 12/20/2018   Procedure: PHOTOCOAGULATION WITH LASER;  Surgeon: Jalene Mullet, MD;  Location: [place];  Service: Ophthalmology;  Laterality: Left;  . 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 Her smoking use included cigarettes. She has a 14.00 pack-year smoking history. She has never used smokeless tobacco. She reports that she does not drink alcohol or use drugs.  Allergies  Allergen Reactions  . Adhesive  [Tape] Hives  . Lisinopril Cough  . Prednisone Swelling and Other (See Comments)    Excessive fluid buildup    Family History  Problem Relation Age of Onset  . Other Mother        not sure of cause of death  . Diabetes Father   . Pancreatic cancer Maternal Grandmother   . Colon cancer Neg Hx       Prior to Admission medications   Medication Sig Start Date End Date Taking? Authorizing Provider  albuterol (PROAIR HFA) 108 (90 Base) MCG/ACT inhaler Inhale 1-2 puffs into the lungs every 6 (six) hours as needed for wheezing or shortness of breath. 06/13/19   Spero Geralds, MD  amLODipine (NORVASC) 10 MG tablet Take 10 mg by mouth at bedtime.    [provider]  aspirin EC 81 MG tablet Take 81 mg by mouth daily.    [provider]  atorvastatin (LIPITOR) 20 MG tablet TAKE 1 TABLET BY MOUTH EVERY DAY Patient taking differently: Take 20 mg by mouth daily at 6 PM.  04/11/19   Adrian Prows, MD  budesonide-formoterol (SYMBICORT) 160-4.5 MCG/ACT inhaler Inhale 1 puff into  the lungs daily as needed (respiratory issues.).     [provider]  carvedilol (COREG) 12.5 MG tablet Take 12.5 mg by mouth See admin instructions. Take 1 tablet (12.5 mg) by mouth once daily in the evening on Mondays, Wednesdays, & Fridays. (Dialysis) Take 1 tablet (12.5 mg) by mouth twice daily on Sundays, Tuesdays, Thursdays, & Saturdays. (Non Dialysis)    [provider]  cetirizine (ZYRTEC) 10 MG tablet Take 1 tablet (10 mg total) by mouth at bedtime. 06/13/19   Spero Geralds, MD  cinacalcet (SENSIPAR) 60 MG tablet Take 60 mg by mouth daily. Take 1 tablet (90 mg) by mouth in the evening on Mondays, Wednesdays, & Fridays (dialysis) Take 1 tablet (90 mg) by mouth twice daily on Sundays, Tuesdays, Thursdays, Saturdays. (non dialysis) 11/28/18   [provider]  diazepam (VALIUM) 5 MG tablet Take 5 mg by mouth 2 (two) times daily as needed for anxiety.    [provider]  ferric citrate (AURYXIA) 1 GM 210 MG(Fe) tablet Take 210-420 mg by mouth See admin instructions. Take 2 tablets (420 mg) by mouth with each meal & take 1 tablet (210 mg) by mouth with snacks.    [provider]  fluticasone (FLONASE) 50 MCG/ACT nasal spray Place 1 spray into both nostrils daily as needed  for allergies or rhinitis.    [provider]  gabapentin (NEURONTIN) 100 MG capsule TAKE 1 CAPSULE (100 MG TOTAL) BY MOUTH AT BEDTIME. WHEN NECESSARY FOR NEUROPATHY PAIN 12/13/18   Newt Minion, MD  hydrocerin (EUCERIN) CREA Apply 170 application topically 2 (two) times daily. Patient taking differently: Apply 1 application topically 2 (two) times daily.  09/16/17   Love, Ivan Anchors, PA-C  HYDROcodone-homatropine (HYCODAN) 5-1.5 MG/5ML syrup Take 5 mLs by mouth every 8 (eight) hours as needed for cough. 05/29/19   [provider]  insulin regular (NOVOLIN R,HUMULIN R) 100 units/mL injection Inject 3-8 Units into the skin See admin instructions. 3 times daily with meals per sliding scale except on Dialysis days, Mon, Wed, Fri    [provider]  isosorbide-hydrALAZINE (BIDIL) 20-37.5 MG tablet Take 1 tablet by mouth 3 (three) times daily. On NON dialysis days pt takes 1 tab 3 times daily, and on Dialysis days, pt takes 1 tab every evning Patient taking differently: Take 1 tablet by mouth See admin instructions. Take 1 tablet by mouth in the evening on Mondays, Wednesdays, & Fridays. (Dialysis) Take 1 tablet 3 times daily on Sundays, Tuesdays, Thursdays, & Saturdays. (Non-Dialysis) 12/06/18   Miquel Dunn, NP  lidocaine-prilocaine (EMLA) cream Apply 1 application topically 3 (three) times a week. 1-2 hours prior to dialysis 01/07/18   [provider]  loratadine (CLARITIN) 10 MG tablet Take 10 mg by mouth daily as needed for allergies.    [provider]  multivitamin (RENA-VIT) TABS tablet Take 1 tablet by mouth daily.  01/24/15   [provider]  omeprazole (PRILOSEC) 20 MG capsule Take 20 mg by mouth daily.     [provider]  ondansetron (ZOFRAN) 4 MG tablet Take 1 tablet (4 mg total) by mouth every 6 (six) hours as needed for nausea. 05/22/19   Aline August, MD  senna (SENOKOT) 8.6 MG tablet Take 1 tablet by mouth as needed for  constipation.    [provider]  Vitamin D, Ergocalciferol, (DRISDOL) 1.25 MG (50000 UT) CAPS capsule Take 50,000 Units by mouth every Sunday. Takes on Sunday     [provider]    Physical Exam: Vitals:   06/25/19 2034 06/25/19 2038 06/25/19 2125  BP: (!) 190/85    Pulse: (!) 110  (!) 103  Resp: (!) 36  14  Temp: 98.1 F (36.7 C)    TempSrc: Oral    SpO2: 100%  96%  Weight:  68 kg   Height:  5\' 5"  (1.651 m)     Constitutional: Resting in bed with head elevated, NAD, calm, comfortable Eyes: PERRL, lids and conjunctivae normal ENMT: Mucous membranes are moist. Posterior pharynx clear of any exudate or lesions.poor dentition.  Neck: normal, supple, no masses. Respiratory: clear to auscultation bilaterally, no wheezing, no crackles. Normal respiratory effort. No accessory muscle use.  Cardiovascular: Irregularly irregular, no murmurs / rubs / gallops. No extremity edema.  LUE aVF with palpable thrill. Abdomen: no tenderness, no masses palpated. No hepatosplenomegaly. Bowel sounds positive.  Musculoskeletal: no clubbing / cyanosis. No joint deformity upper and lower extremities. Good ROM, no contractures. Normal muscle tone.  Skin: no rashes, lesions, ulcers. No induration Neurologic: CN 2-12 grossly intact. Sensation intact, Strength 5/5 in all 4.  Psychiatric: Normal judgment and insight. Alert and oriented x 3. Normal mood.     Labs on Admission: I have personally reviewed following labs and imaging studies  CBC: Recent Labs  Lab 06/25/19 2047  WBC 7.6  NEUTROABS 5.0  HGB 12.5  HCT 39.1  MCV 97.3  PLT 381   Basic Metabolic Panel: Recent Labs  Lab 06/25/19 2047  NA 133*  K 3.2*  CL 92*  CO2 26  GLUCOSE 117*  BUN 31*  CREATININE 7.24*  CALCIUM 8.5*   GFR: Estimated Creatinine Clearance: 6.7 mL/min (A) (by C-G formula based on SCr of 7.24 mg/dL (H)). Liver Function Tests: No results for input(s): AST, ALT, ALKPHOS, BILITOT, PROT, ALBUMIN in  the last 168 hours. No results for input(s): LIPASE, AMYLASE in the last 168 hours. No results for input(s): AMMONIA in the last 168 hours. Coagulation Profile: No results for input(s): INR, PROTIME in the last 168 hours. Cardiac Enzymes: No results for input(s): CKTOTAL, CKMB, CKMBINDEX, TROPONINI in the last 168 hours. BNP (last 3 results) No results for input(s): PROBNP in the last 8760 hours. HbA1C: No results for input(s): HGBA1C in the last 72 hours. CBG: No results for input(s): GLUCAP in the last 168 hours. Lipid Profile: No results for input(s): CHOL, HDL, LDLCALC, TRIG, CHOLHDL, LDLDIRECT in the last 72 hours. Thyroid Function Tests: No results for input(s): TSH, T4TOTAL, FREET4, T3FREE, THYROIDAB in the last 72 hours. Anemia Panel: No results for input(s): VITAMINB12, FOLATE, FERRITIN, TIBC, IRON, RETICCTPCT in the last 72 hours. Urine analysis:    Component Value Date/Time   COLORURINE YELLOW 07/07/2016 0914   APPEARANCEUR CLOUDY (A) 07/07/2016 0914   LABSPEC >1.030 (H) 07/07/2016 0914   PHURINE 5.5 07/07/2016 0914   GLUCOSEU NEGATIVE 07/07/2016 0914   HGBUR SMALL (A) 07/07/2016 0914   BILIRUBINUR SMALL (A) 07/07/2016 0914   KETONESUR 15 (A) 07/07/2016 0914   PROTEINUR >300 (A) 07/07/2016 0914   UROBILINOGEN 0.2 07/30/2014 2000   NITRITE NEGATIVE 07/07/2016 0914   LEUKOCYTESUR SMALL (A) 07/07/2016 0914    Radiological Exams on Admission: Dg Chest Port 1 View  Result Date: 06/25/2019 CLINICAL DATA:  68 year old female with acute shortness of breath tonight. Dialysis patient. Status post COVID-19 in October. EXAM: PORTABLE CHEST 1 VIEW COMPARISON:  Chest CTA 06/18/2018. 05/17/2019 portable chest and earlier. FINDINGS: Portable AP upright view at 2106 hours.  Continued small bilateral pleural effusions. Basilar predominant increased pulmonary interstitial opacity greater than that in October. No consolidation or pneumothorax. Stable cardiac size and mediastinal  contours. Calcified aortic atherosclerosis. Visualized tracheal air column is within normal limits. Overall ventilation appears stable from the recent CTA. Paucity of bowel gas in the upper abdomen. No acute osseous abnormality identified. IMPRESSION: Suspected pulmonary edema and small pleural effusions not significantly changed from the CTA on 06/18/2018. Electronically Signed   By: Genevie Ann M.D.   On: 06/25/2019 21:20    EKG: Independently reviewed. Sinus tachycardia with PACs, QTC 457.  When compared to prior PACs are new.  Assessment/Plan Principal Problem:   Acute respiratory failure with hypoxia (HCC) Active Problems:   Hyperlipidemia associated with type 2 diabetes mellitus (HCC)   Asthma   Type 2 diabetes mellitus with diabetic nephropathy (HCC)   ESRD on dialysis ([place])   Hypertension associated with diabetes ([place])   Acute pulmonary edema (HCC)  Destiny Day is a 68 y.o. female with medical history significant for ESRD on MWF HD, asthma, insulin-dependent type 2 diabetes, hypertension, hyperlipidemia, PVD, anxiety, and OSA not currently using CPAP who is admitted with acute respiratory failure with hypoxia with pulmonary edema.  Acute respiratory failure with hypoxia with pulmonary edema: -Plan for HD in the a.m. per nephrology -Continue supplemental O2 as needed and wean off as able  ESRD on MWF HD: -Nephrology following, plan for HD in AM  Recent COVID-19 infection: Recent admit from 05/17/2019-05/22/2019, completed IV remdesivir treatment.  SARS-CoV-2 antigen test is negative in the ED, PCR test is pending.  Presenting symptoms most consistent with pulmonary edema, she does not seem to have further symptoms with regard to COVID-19 infection.  Asthma: No wheezing on admission. -Continue Dulera and as needed albuterol  Hypertension: Hypertensive on admission.  Resume home amlodipine, Coreg, BiDil.  Further control with dialysis.  Insulin-dependent type 2 diabetes:  -Continue very sensitive SSI  Hyperlipidemia: -Continue atorvastatin  Anxiety: -Continue as needed diazepam with hold parameters  DVT prophylaxis: Subcutaneous heparin Code Status: Full code, confirmed with patient Family Communication: Discussed with patient, she has discussed with her husband Disposition Plan: Likely discharge to home pending clinical progress Consults called: Nephrology Admission status: Inpatient for management of acute respiratory failure with pulmonary edema in setting of ESRD requiring scheduled dialysis.  She is high risk for decompensation given her comorbidities including asthma, hypertension, insulin-dependent diabetes, ESRD, and recent COVID-19 pneumonia.   Zada Finders MD [facility]  If 7PM-7AM, please contact night-coverage [email or web address]  06/25/2019, 11:54 PM

## 2019-06-25 NOTE — Consult Note (Signed)
Renal Service Consult Note [facility]  Destiny Day 06/25/2019 Sol Blazing Requesting Physician:  Dr Maryan Rued, Viona Gilmore.   Reason for Consult:  ESRD pt w/ SOB and pulm edema HPI: The patient is a 69 y.o. year-old with hx of OSA, HTN, HL, ESRD on HD, DM2, CHF , hx of SDH, PAD w/ R fem- pop, COPD and anemia presented to ED with SOB x a few hours. Last HD Friday. Had Covid infection here in October, tested negative about 3 wks ago.  CXR here showing IS edema.  POC COVID test here is negative. Asked to see for ESRD.    Patient says she came off at dry wt 66.5 on Friday's HD, thinks she just "got into too much holiday food and drink".  She will be 4 yrs on HD in December. Lives at home w/ husband. Has braces which weigh 1.2kg to subtract from her weights. Has L arm AVG, no recent problems.   Main c/o is sOB, light cough, no fevers or chills, myalgias.  No CP.    ROS  denies CP  no joint pain   no HA  no blurry vision  no rash  no diarrhea  no nausea/ vomiting     Past Medical History  Past Medical History:  Diagnosis Date  . Abdominal bruit   . Anemia   . Anxiety   . Arthritis    Osteoarthritis  . Asthma   . Cervical disc disease    "pinced nerve"  . CHF (congestive heart failure) ([place])   . Complication of anesthesia    " after I got home from my last procedure, I started itching."  . Diabetes mellitus    Type II  . Diverticulitis   . ESRD (end stage renal disease) ([place])    dialysis - M/W/F- [place]  . GERD (gastroesophageal reflux disease)    from medications  . GI bleed 03/31/2013  . Head injury 07/2017  . History of hiatal hernia   . Hyperlipidemia   . Hypertension   . Neuropathy    left leg  . Osteoporosis   . Peripheral vascular disease ([place])   . Pneumonia    "very young" and a few years ago  . PONV (postoperative nausea and vomiting)   . Seasonal allergies   . Shortness of breath dyspnea    WIth exertion, when fluid builds  . Sleep  apnea    can't afford cpap    Past Surgical History  Past Surgical History:  Procedure Laterality Date  . A/V SHUNTOGRAM N/A 09/22/2016   Procedure: A/V Shuntogram - left arm;  Surgeon: Serafina Mitchell, MD;  Location: [place] [facility];  Service: Cardiovascular;  Laterality: N/A;  . A/V SHUNTOGRAM N/A 03/22/2018   Procedure: A/V SHUNTOGRAM - left arm;  Surgeon: Serafina Mitchell, MD;  Location: [place] [facility];  Service: Cardiovascular;  Laterality: N/A;  . A/V SHUNTOGRAM Left 11/17/2018   Procedure: A/V SHUNTOGRAM;  Surgeon: Angelia Mould, MD;  Location: [place] [facility];  Service: Cardiovascular;  Laterality: Left;  . ABDOMINAL HYSTERECTOMY  1993`  . AMPUTATION Left 09/01/2016   Procedure: LEFT FOOT TRANSMETATARSAL AMPUTATION;  Surgeon: Newt Minion, MD;  Location: [place];  Service: Orthopedics;  Laterality: Left;  . AMPUTATION Right 08/11/2017   Procedure: RIGHT GREAT TOE AMPUTATION DIGIT;  Surgeon: Rosetta Posner, MD;  Location: [facility];  Service: Vascular;  Laterality: Right;  . AMPUTATION Right 12/31/2017   Procedure: RIGHT TRANSMETATARSAL  AMPUTATION;  Surgeon: Newt Minion, MD;  Location: [place];  Service: Orthopedics;  Laterality: Right;  . AV FISTULA PLACEMENT Left 04/21/2016   Procedure: INSERTION OF ARTERIOVENOUS (AV) GORE-TEX GRAFT ARM LEFT;  Surgeon: Elam Dutch, MD;  Location: [place];  Service: Vascular;  Laterality: Left;  . [place] TRANSPOSITION Left 07/10/2014   Procedure: BASCILIC VEIN TRANSPOSITION;  Surgeon: Angelia Mould, MD;  Location: [place];  Service: Vascular;  Laterality: Left;  . BASCILIC VEIN TRANSPOSITION Right 11/08/2014   Procedure: FIRST STAGE BASILIC VEIN TRANSPOSITION;  Surgeon: Angelia Mould, MD;  Location: [place];  Service: Vascular;  Laterality: Right;  . BASCILIC VEIN TRANSPOSITION Right 01/18/2015   Procedure: SECOND STAGE BASILIC VEIN TRANSPOSITION;  Surgeon: Angelia Mould, MD;  Location: [place];  Service: Vascular;   Laterality: Right;  . CRANIOTOMY N/A 08/23/2017   Procedure: CRANIOTOMY HEMATOMA EVACUATION SUBDURAL;  Surgeon: Ashok Pall, MD;  Location: [place];  Service: Neurosurgery;  Laterality: N/A;  . CRANIOTOMY Left 08/24/2017   Procedure: CRANIOTOMY FOR RECURRENT ACUTE SUBDURAL HEMATOMA;  Surgeon: Ashok Pall, MD;  Location: [place];  Service: Neurosurgery;  Laterality: Left;  . ESOPHAGOGASTRODUODENOSCOPY N/A 03/31/2013   Procedure: ESOPHAGOGASTRODUODENOSCOPY (EGD);  Surgeon: Gatha Mayer, MD;  Location: [facility]COPY;  Service: Endoscopy;  Laterality: N/A;  . EYE SURGERY     laser surgery  . FEMORAL-POPLITEAL BYPASS GRAFT Left 07/08/2016   Procedure: LEFT  FEMORAL-BELOW KNEE POPLITEAL ARTERY BYPASS GRAFT USING 6MM X 80 CM PROPATEN GORETEX GRAFT WITH RINGS.;  Surgeon: Rosetta Posner, MD;  Location: [place];  Service: Vascular;  Laterality: Left;  . FEMORAL-POPLITEAL BYPASS GRAFT Right 08/11/2017   Procedure: RIGHT FEMORAL TO BELOW KNEE POPLITEAL ARTERKY  BYPASS GRAFT USING 6MM RINGED PROPATEN GRAFT;  Surgeon: Rosetta Posner, MD;  Location: [place];  Service: Vascular;  Laterality: Right;  . FISTULOGRAM Left 10/29/2014   Procedure: FISTULOGRAM;  Surgeon: Angelia Mould, MD;  Location: [facility] CATH LAB;  Service: Cardiovascular;  Laterality: Left;  Marland Kitchen GAS/FLUID EXCHANGE Left 12/20/2018   Procedure: AIR GAS EXCHANGE;  Surgeon: Jalene Mullet, MD;  Location: [place];  Service: Ophthalmology;  Laterality: Left;  . KNEE ARTHROSCOPY Right 05/06/2018   Procedure: RIGHT KNEE ARTHROSCOPY;  Surgeon: Newt Minion, MD;  Location: [place];  Service: Orthopedics;  Laterality: Right;  . KNEE ARTHROSCOPY Right 06/10/2018   Procedure: RIGHT KNEE ARTHROSCOPY AND DEBRIDEMENT;  Surgeon: Newt Minion, MD;  Location: [place];  Service: Orthopedics;  Laterality: Right;  . LIGATION OF ARTERIOVENOUS  FISTULA Left 04/21/2016   Procedure: LIGATION OF ARTERIOVENOUS  FISTULA LEFT ARM;  Surgeon: Elam Dutch, MD;  Location: [place];  Service:  Vascular;  Laterality: Left;  . LOWER EXTREMITY ANGIOGRAPHY N/A 04/06/2017   Procedure: Lower Extremity Angiography - Right;  Surgeon: Serafina Mitchell, MD;  Location: [place] [facility];  Service: Cardiovascular;  Laterality: N/A;  . MEMBRANE PEEL Left 12/20/2018   Procedure: MEMBRANE PEEL;  Surgeon: Jalene Mullet, MD;  Location: [place];  Service: Ophthalmology;  Laterality: Left;  . PATCH ANGIOPLASTY Right 01/18/2015   Procedure: BASILIC VEIN PATCH ANGIOPLASTY USING VASCUGUARD PATCH;  Surgeon: Angelia Mould, MD;  Location: [place];  Service: Vascular;  Laterality: Right;  . PERIPHERAL VASCULAR BALLOON ANGIOPLASTY  09/22/2016   Procedure: Peripheral Vascular Balloon Angioplasty;  Surgeon: Serafina Mitchell, MD;  Location: [place] [facility];  Service: Cardiovascular;;  Lt. Fistula  . PERIPHERAL VASCULAR BALLOON ANGIOPLASTY Left 03/22/2018  Procedure: PERIPHERAL VASCULAR BALLOON ANGIOPLASTY;  Surgeon: Serafina Mitchell, MD;  Location: [place] [facility];  Service: Cardiovascular;  Laterality: Left;  Arm shunt  . PERIPHERAL VASCULAR CATHETERIZATION N/A 06/23/2016   Procedure: Abdominal Aortogram w/Lower Extremity;  Surgeon: Serafina Mitchell, MD;  Location: [place] [facility];  Service: Cardiovascular;  Laterality: N/A;  . PERIPHERAL VASCULAR CATHETERIZATION  06/23/2016   Procedure: Peripheral Vascular Intervention;  Surgeon: Serafina Mitchell, MD;  Location: [place] [facility];  Service: Cardiovascular;;  lt common and external illiac artery  . PHOTOCOAGULATION WITH LASER Left 12/20/2018   Procedure: PHOTOCOAGULATION WITH LASER;  Surgeon: Jalene Mullet, MD;  Location: [place];  Service: Ophthalmology;  Laterality: Left;  . 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   Procedure: REPAIR OF COMPLEX TRACTION RETINAL DETACHMENT;  Surgeon: Jalene Mullet, MD;  Location: [place];  Service: Ophthalmology;  Laterality: Left;   Family History  Family History  Problem Relation Age of Onset  .  Other Mother        not sure of cause of death  . Diabetes Father   . Pancreatic cancer Maternal Grandmother   . Colon cancer Neg Hx    Social History  reports that she quit smoking about 7 years ago. Her smoking use included cigarettes. She has a 14.00 pack-year smoking history. She has never used smokeless tobacco. She reports that she does not drink alcohol or use drugs. Allergies  Allergies  Allergen Reactions  . Adhesive  [Tape] Hives  . Lisinopril Cough  . Prednisone Swelling and Other (See Comments)    Excessive fluid buildup   Home medications Prior to Admission medications   Medication Sig Start Date End Date Taking? Authorizing Provider  albuterol (PROAIR HFA) 108 (90 Base) MCG/ACT inhaler Inhale 1-2 puffs into the lungs every 6 (six) hours as needed for wheezing or shortness of breath. 06/13/19   Spero Geralds, MD  amLODipine (NORVASC) 10 MG tablet Take 10 mg by mouth at bedtime.    [provider]  aspirin EC 81 MG tablet Take 81 mg by mouth daily.    [provider]  atorvastatin (LIPITOR) 20 MG tablet TAKE 1 TABLET BY MOUTH EVERY DAY Patient taking differently: Take 20 mg by mouth daily at 6 PM.  04/11/19   Adrian Prows, MD  budesonide-formoterol (SYMBICORT) 160-4.5 MCG/ACT inhaler Inhale 1 puff into the lungs daily as needed (respiratory issues.).     [provider]  carvedilol (COREG) 12.5 MG tablet Take 12.5 mg by mouth See admin instructions. Take 1 tablet (12.5 mg) by mouth once daily in the evening on Mondays, Wednesdays, & Fridays. (Dialysis) Take 1 tablet (12.5 mg) by mouth twice daily on Sundays, Tuesdays, Thursdays, & Saturdays. (Non Dialysis)    [provider]  cetirizine (ZYRTEC) 10 MG tablet Take 1 tablet (10 mg total) by mouth at bedtime. 06/13/19   Spero Geralds, MD  cinacalcet (SENSIPAR) 60 MG tablet Take 60 mg by mouth daily. Take 1 tablet (90 mg) by mouth in the evening on Mondays, Wednesdays, & Fridays (dialysis) Take  1 tablet (90 mg) by mouth twice daily on Sundays, Tuesdays, Thursdays, Saturdays. (non dialysis) 11/28/18   [provider]  diazepam (VALIUM) 5 MG tablet Take 5 mg by mouth 2 (two) times daily as needed for anxiety.    [provider]  ferric citrate (AURYXIA) 1 GM 210 MG(Fe) tablet Take 210-420 mg by mouth See admin instructions. Take 2 tablets (420 mg)  by mouth with each meal & take 1 tablet (210 mg) by mouth with snacks.    [provider]  fluticasone (FLONASE) 50 MCG/ACT nasal spray Place 1 spray into both nostrils daily as needed for allergies or rhinitis.    [provider]  gabapentin (NEURONTIN) 100 MG capsule TAKE 1 CAPSULE (100 MG TOTAL) BY MOUTH AT BEDTIME. WHEN NECESSARY FOR NEUROPATHY PAIN 12/13/18   Newt Minion, MD  hydrocerin (EUCERIN) CREA Apply 209 application topically 2 (two) times daily. Patient taking differently: Apply 1 application topically 2 (two) times daily.  09/16/17   Love, Ivan Anchors, PA-C  HYDROcodone-homatropine (HYCODAN) 5-1.5 MG/5ML syrup Take 5 mLs by mouth every 8 (eight) hours as needed for cough. 05/29/19   [provider]  insulin regular (NOVOLIN R,HUMULIN R) 100 units/mL injection Inject 3-8 Units into the skin See admin instructions. 3 times daily with meals per sliding scale except on Dialysis days, Mon, Wed, Fri    [provider]  isosorbide-hydrALAZINE (BIDIL) 20-37.5 MG tablet Take 1 tablet by mouth 3 (three) times daily. On NON dialysis days pt takes 1 tab 3 times daily, and on Dialysis days, pt takes 1 tab every evning Patient taking differently: Take 1 tablet by mouth See admin instructions. Take 1 tablet by mouth in the evening on Mondays, Wednesdays, & Fridays. (Dialysis) Take 1 tablet 3 times daily on Sundays, Tuesdays, Thursdays, & Saturdays. (Non-Dialysis) 12/06/18   Miquel Dunn, NP  lidocaine-prilocaine (EMLA) cream Apply 1 application topically 3 (three) times a week. 1-2 hours prior to  dialysis 01/07/18   [provider]  loratadine (CLARITIN) 10 MG tablet Take 10 mg by mouth daily as needed for allergies.    [provider]  multivitamin (RENA-VIT) TABS tablet Take 1 tablet by mouth daily.  01/24/15   [provider]  omeprazole (PRILOSEC) 20 MG capsule Take 20 mg by mouth daily.     [provider]  ondansetron (ZOFRAN) 4 MG tablet Take 1 tablet (4 mg total) by mouth every 6 (six) hours as needed for nausea. 05/22/19   Aline August, MD  senna (SENOKOT) 8.6 MG tablet Take 1 tablet by mouth as needed for constipation.    [provider]  Vitamin D, Ergocalciferol, (DRISDOL) 1.25 MG (50000 UT) CAPS capsule Take 50,000 Units by mouth every Sunday. Takes on Sunday     [provider]   Liver Function Tests No results for input(s): AST, ALT, ALKPHOS, BILITOT, PROT, ALBUMIN in the last 168 hours. No results for input(s): LIPASE, AMYLASE in the last 168 hours. CBC Recent Labs  Lab 06/25/19 2047  WBC 7.6  NEUTROABS 5.0  HGB 12.5  HCT 39.1  MCV 97.3  PLT 470   Basic Metabolic Panel Recent Labs  Lab 06/25/19 2047  NA 133*  K 3.2*  CL 92*  CO2 26  GLUCOSE 117*  BUN 31*  CREATININE 7.24*  CALCIUM 8.5*   Iron/TIBC/Ferritin/ %Sat    Component Value Date/Time   IRON 31 08/28/2017 0414   TIBC 140 (L) 08/28/2017 0414   FERRITIN 1,708 (H) 05/22/2019 0900   IRONPCTSAT 22 08/28/2017 0414    Vitals:   06/25/19 2034 06/25/19 2038 06/25/19 2125  BP: (!) 190/85    Pulse: (!) 110  (!) 103  Resp: (!) 36  14  Temp: 98.1 F (36.7 C)    TempSrc: Oral    SpO2: 100%  96%  Weight:  68 kg   Height:  5\' 5"  (1.651  m)     Exam Gen alert, no distress, on nasal O2 high flow No rash, cyanosis or gangrene Sclera anicteric, throat clear  No jvd or bruits Chest poor air movement in general bilat, bilat crackles 1/3 up, no distress RRR no MRG Abd soft ntnd no mass or ascites +bs GU defer MS no joint effusions or  deformity, had bilat leg braces on Ext no LE or UE edema, no wounds or ulcers Neuro is alert, Ox 3 , nf    Home meds:  - amlodipine 10 hs/ carvedilol 12.5 / isosorbide-hydralazine 20-37.5 tid nonHD and hs on HD days  - insulin regular tid ac ssi  - ferric citrate ac/ cinacalcet 90 as directed  - atorvastatin 20 hs/ aspirin 81  - albuterol nebs prn/ budesnoide-formoterol 160-4.5 prn  - diazepam 5mg  bid/ hydrocodone syrup prn  - omeprazole 20 qd  - prn's/ vitamins/ supplements     Outpt [id number] MWf  4h 66.5kg 2/2.25 bath  L AVG Hep 2500 w/ [id number]   Assessment/ Plan: 1. SOB - pulm edema by CXR. Pt is comfortable on nasal cannula. Will be admitted, will plan HD 1st shift in am, UF 3-4 L as tolerated.    2. ESRD - on HD MWF. As above.  3. HTN - cont home meds as needed 4. Recent COVID infection - in hospital from 10/21- 05/22/19. About 2 wks ago pt had her 2nd negative test in a row at the OP HD unit and was released from the [place] [facility] HD unit back to her regular HD unit. Tested POC in ED negative today as well.  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 DM on insulin 7. Anemia ckd - Hb 12, no esa needed now    Kelly Splinter  MD 06/25/2019, 10:25 PM

## 2019-06-25 NOTE — Progress Notes (Signed)
Place pt. On HFNC with MD permission.

## 2019-06-25 NOTE — ED Triage Notes (Addendum)
BIB EMS, shortness of breath for about 2 hours. Dialysis pt, last treatment Friday. 200/90, 90% on room air. EMS place pt on non rebreather. Had Covid in October. Tested negative about 3 weeks ago

## 2019-06-26 DIAGNOSIS — Z992 Dependence on renal dialysis: Secondary | ICD-10-CM

## 2019-06-26 DIAGNOSIS — N186 End stage renal disease: Secondary | ICD-10-CM

## 2019-06-26 DIAGNOSIS — J810 Acute pulmonary edema: Secondary | ICD-10-CM

## 2019-06-26 DIAGNOSIS — J9600 Acute respiratory failure, unspecified whether with hypoxia or hypercapnia: Secondary | ICD-10-CM | POA: Diagnosis present

## 2019-06-26 LAB — CBC
HCT: 34.2 % — ABNORMAL LOW (ref 36.0–46.0)
Hemoglobin: 11.2 g/dL — ABNORMAL LOW (ref 12.0–15.0)
MCH: 31.2 pg (ref 26.0–34.0)
MCHC: 32.7 g/dL (ref 30.0–36.0)
MCV: 95.3 fL (ref 80.0–100.0)
Platelets: 294 10*3/uL (ref 150–400)
RBC: 3.59 MIL/uL — ABNORMAL LOW (ref 3.87–5.11)
RDW: 13.7 % (ref 11.5–15.5)
WBC: 7.1 10*3/uL (ref 4.0–10.5)
nRBC: 0 % (ref 0.0–0.2)

## 2019-06-26 LAB — HEMOGLOBIN A1C
Hgb A1c MFr Bld: 5.6 % (ref 4.8–5.6)
Mean Plasma Glucose: 114.02 mg/dL

## 2019-06-26 LAB — RENAL FUNCTION PANEL
Albumin: 2.9 g/dL — ABNORMAL LOW (ref 3.5–5.0)
Anion gap: 15 (ref 5–15)
BUN: 35 mg/dL — ABNORMAL HIGH (ref 8–23)
CO2: 26 mmol/L (ref 22–32)
Calcium: 8.4 mg/dL — ABNORMAL LOW (ref 8.9–10.3)
Chloride: 94 mmol/L — ABNORMAL LOW (ref 98–111)
Creatinine, Ser: 7.65 mg/dL — ABNORMAL HIGH (ref 0.44–1.00)
GFR calc Af Amer: 6 mL/min — ABNORMAL LOW (ref >60)
GFR calc non Af Amer: 5 mL/min — ABNORMAL LOW (ref >60)
Glucose, Bld: 102 mg/dL — ABNORMAL HIGH (ref 70–99)
Phosphorus: 5.5 mg/dL — ABNORMAL HIGH (ref 2.5–4.6)
Potassium: 3.1 mmol/L — ABNORMAL LOW (ref 3.5–5.1)
Sodium: 135 mmol/L (ref 135–145)

## 2019-06-26 LAB — GLUCOSE, CAPILLARY
Glucose-Capillary: 106 mg/dL — ABNORMAL HIGH (ref 70–99)
Glucose-Capillary: 190 mg/dL — ABNORMAL HIGH (ref 70–99)
Glucose-Capillary: 105 mg/dL — ABNORMAL HIGH (ref 70–99)

## 2019-06-26 LAB — SARS CORONAVIRUS 2 (TAT 6-24 HRS): SARS Coronavirus 2: NEGATIVE

## 2019-06-26 MED ORDER — MOMETASONE FURO-FORMOTEROL FUM 200-5 MCG/ACT IN AERO
2.0000 | INHALATION_SPRAY | Freq: Two times a day (BID) | RESPIRATORY_TRACT | Status: DC
  Filled 2019-06-26: qty 8.8

## 2019-06-26 MED ORDER — FERRIC CITRATE 1 GM 210 MG(FE) PO TABS
420.0000 mg | ORAL_TABLET | Freq: Three times a day (TID) | ORAL | Status: DC
  Administered 2019-06-26 – 2019-06-27 (×2): 420 mg via ORAL
  Filled 2019-06-26 (×3): qty 2

## 2019-06-26 MED ORDER — ISOSORB DINITRATE-HYDRALAZINE 20-37.5 MG PO TABS
1.0000 | ORAL_TABLET | ORAL | Status: DC
  Administered 2019-06-27: 1 via ORAL
  Filled 2019-06-26: qty 1

## 2019-06-26 MED ORDER — ALBUTEROL SULFATE (2.5 MG/3ML) 0.083% IN NEBU: 3.0000 mL | INHALATION_SOLUTION | Freq: Four times a day (QID) | RESPIRATORY_TRACT | Status: DC | PRN

## 2019-06-26 MED ORDER — GABAPENTIN 100 MG PO CAPS
100.0000 mg | ORAL_CAPSULE | Freq: Every day | ORAL | Status: DC
  Administered 2019-06-26 (×2): 100 mg via ORAL
  Filled 2019-06-26: qty 1

## 2019-06-26 MED ORDER — ASPIRIN EC 81 MG PO TBEC
81.0000 mg | DELAYED_RELEASE_TABLET | Freq: Every day | ORAL | Status: DC
  Administered 2019-06-26 – 2019-06-27 (×2): 81 mg via ORAL
  Filled 2019-06-26 (×2): qty 1

## 2019-06-26 MED ORDER — HEPARIN SODIUM (PORCINE) 1000 UNIT/ML DIALYSIS
3000.0000 [IU] | Freq: Once | INTRAMUSCULAR | Status: AC
  Administered 2019-06-26: 08:00:00 3000 [IU] via INTRAVENOUS_CENTRAL

## 2019-06-26 MED ORDER — AMLODIPINE BESYLATE 10 MG PO TABS
10.0000 mg | ORAL_TABLET | Freq: Every day | ORAL | Status: DC
  Administered 2019-06-26 (×2): 10 mg via ORAL
  Filled 2019-06-26 (×2): qty 1
  Filled 2019-06-26: qty 2
  Filled 2019-06-26: qty 1

## 2019-06-26 MED ORDER — DIAZEPAM 5 MG PO TABS: 5.0000 mg | ORAL_TABLET | Freq: Two times a day (BID) | ORAL | Status: DC | PRN

## 2019-06-26 MED ORDER — CARVEDILOL 12.5 MG PO TABS
12.5000 mg | ORAL_TABLET | ORAL | Status: DC
  Administered 2019-06-26 (×2): 12.5 mg via ORAL
  Filled 2019-06-26: qty 1

## 2019-06-26 MED ORDER — ATORVASTATIN CALCIUM 10 MG PO TABS
20.0000 mg | ORAL_TABLET | Freq: Every day | ORAL | Status: DC
  Administered 2019-06-26: 20 mg via ORAL
  Filled 2019-06-26: qty 2

## 2019-06-26 MED ORDER — HEPARIN SODIUM (PORCINE) 1000 UNIT/ML IJ SOLN
INTRAMUSCULAR | Status: AC
  Administered 2019-06-26: 3000 [IU] via INTRAVENOUS_CENTRAL
  Filled 2019-06-26: qty 3

## 2019-06-26 MED ORDER — CARVEDILOL 12.5 MG PO TABS
12.5000 mg | ORAL_TABLET | ORAL | Status: DC
  Administered 2019-06-27: 12.5 mg via ORAL
  Filled 2019-06-26 (×2): qty 1

## 2019-06-26 MED ORDER — INSULIN ASPART 100 UNIT/ML ~~LOC~~ SOLN
0.0000 [IU] | Freq: Three times a day (TID) | SUBCUTANEOUS | Status: DC
  Administered 2019-06-26: 1 [IU] via SUBCUTANEOUS

## 2019-06-26 MED ORDER — ISOSORB DINITRATE-HYDRALAZINE 20-37.5 MG PO TABS
1.0000 | ORAL_TABLET | ORAL | Status: DC
  Administered 2019-06-26: 1 via ORAL
  Filled 2019-06-26: qty 1

## 2019-06-26 MED ORDER — FERRIC CITRATE 1 GM 210 MG(FE) PO TABS
210.0000 mg | ORAL_TABLET | ORAL | Status: DC | PRN
  Filled 2019-06-26: qty 1

## 2019-06-26 MED ORDER — SENNA 8.6 MG PO TABS: 1.0000 | ORAL_TABLET | Freq: Every day | ORAL | Status: DC | PRN

## 2019-06-26 NOTE — Procedures (Signed)
I was present at this dialysis session. I have reviewed the session itself and made appropriate changes.   3.5L UF goal. 4K bath. NO c/o says breathing already improved after 1L UF.  If resp issues resolved ok for DC back to MWF HD at [facility].   Filed Weights   06/25/19 2038  Weight: 68 kg    Recent Labs  Lab 06/26/19 0500  NA 135  K 3.1*  CL 94*  CO2 26  GLUCOSE 102*  BUN 35*  CREATININE 7.65*  CALCIUM 8.4*  PHOS 5.5*    Recent Labs  Lab 06/25/19 2047 06/26/19 0500  WBC 7.6 7.1  NEUTROABS 5.0  --   HGB 12.5 11.2*  HCT 39.1 34.2*  MCV 97.3 95.3  PLT 315 294    Scheduled Meds: . amLODipine  10 mg Oral QHS  . aspirin EC  81 mg Oral Daily  . atorvastatin  20 mg Oral q1800  . carvedilol  12.5 mg Oral Q M,W,F-1800  . [START ON 06/27/2019] carvedilol  12.5 mg Oral 2 times per day on Sun Tue Thu Sat  . Chlorhexidine Gluconate Cloth  6 each Topical Q0600  . ferric citrate  210-420 mg Oral See admin instructions  . gabapentin  100 mg Oral QHS  . heparin  5,000 Units Subcutaneous Q8H  . insulin aspart  0-6 Units Subcutaneous TID WC  . isosorbide-hydrALAZINE  1 tablet Oral See admin instructions  . mometasone-formoterol  2 puff Inhalation BID  . sodium chloride flush  3 mL Intravenous Q12H   Continuous Infusions: PRN Meds:.acetaminophen **OR** acetaminophen, albuterol, diazepam, ondansetron **OR** ondansetron (ZOFRAN) IV, senna   Pearson Grippe  MD 06/26/2019, 8:40 AM

## 2019-06-26 NOTE — Progress Notes (Signed)
PROGRESS NOTE    Destiny Day  BOF:[id number] DOB: June 10, 1951 DOA: 06/25/2019 PCP: Velna Hatchet, MD    Brief Narrative:  68 year old female with history of ESRD on HD MWF, insulin-dependent diabetes mellitus, hypertension, hyperlipidemia, PVD, anxiety, obstructive sleep apnea not on CPAP presented to the emergency department with complaints of shortness of breath started around 730 on 06/26/2019 while laying down.  The shortness of breath worse laying flat in the bed.  Patient has been noncompliant with the fluid intake with increased fluid and salt.  Patient's dry weight is around 67.  Also complaining of chronic dry cough which is worse.  Denied any chest pain, palpitations.    Patient was recently hospitalized from 05/16/2021 05/22/2019 for COVID-19 pneumonia, treated with 5-day course of IV remdesivir, was not given steroids due to history of angioedema secondary to prednisone.  Patient was discharged on room air.    Work-up in the emergency department patient was found to have a blood pressure of 190/85, pulse 105, respiratory rate 36, temperature 98.1, oxygen saturations 100% on 2 L of oxygen, however patient's oxygen saturation significantly worsened, required high flow nasal cannula.  Chest x-ray showed pulmonary edema with a small pleural effusions.  Lab work-up significant for low potassium of 3.1, BUN 35, creatinine 7.65.  Assessment & Plan:   Principal Problem:   Acute respiratory failure with hypoxia (HCC) Active Problems:   Hyperlipidemia associated with type 2 diabetes mellitus (HCC)   Asthma   Type 2 diabetes mellitus with diabetic nephropathy (HCC)   ESRD on dialysis ([place])   Hypertension associated with diabetes ([place])   Acute pulmonary edema (HCC)   ##Acute hypoxic respiratory failure -From acute pulmonary edema from noncompliance with the fluid and salt intake -Consulted nephrology -Plan to undergo hemodialysis this morning.  Hypertension accelerated  -Continue with the Coreg, amlodipine, Imdur  Hypokalemia -Replace by mouth  ESRD on HD -Nephrology is consulted -Continue with Monday/Wednesday/Friday schedule  Diabetes mellitus insulin-dependent -Hemoglobin A1c is 5.6 -Follow-up on sliding scale insulin  Hyperlipidemia -Continue with the atorvastatin  DVT prophylaxis: Heparin Code Status: Full code  family Communication: Patient Disposition Plan: Home when stable   Consultants:  Nephrology   Subjective: Improved shortness of breath Objective: Vitals:   06/26/19 0722 06/26/19 0730 06/26/19 0800 06/26/19 0830  BP: (!) 151/56 (!) 153/67 (!) 177/86 (!) (P) 149/61  Pulse: 84 81 82 (P) 78  Resp:      Temp:      TempSrc:      SpO2:   (P) 97%   Weight:      Height:       No intake or output data in the 24 hours ending 06/26/19 0949 Filed Weights   06/25/19 2038  Weight: 68 kg    Examination:  General exam: Appears calm and comfortable  Respiratory system: Clear to auscultation. Respiratory effort normal. Cardiovascular system: S1 & S2 heard, RRR. No JVD, murmurs, rubs, gallops or clicks. No pedal edema. Gastrointestinal system: Abdomen is nondistended, soft and nontender. No organomegaly or masses felt. Normal bowel sounds heard. Central nervous system: Alert and oriented. No focal neurological deficits. Extremities: Symmetric 5 x 5 power. Skin: No rashes, lesions or ulcers Psychiatry: Judgement and insight appear normal. Mood & affect appropriate.     Data Reviewed: I have personally reviewed following labs and imaging studies  CBC: Recent Labs  Lab 06/25/19 2047 06/26/19 0500  WBC 7.6 7.1  NEUTROABS 5.0  --   HGB 12.5 11.2*  HCT 39.1 34.2*  MCV 97.3 95.3  PLT 315 329   Basic Metabolic Panel: Recent Labs  Lab 06/25/19 2047 06/26/19 0500  NA 133* 135  K 3.2* 3.1*  CL 92* 94*  CO2 26 26  GLUCOSE 117* 102*  BUN 31* 35*  CREATININE 7.24* 7.65*  CALCIUM 8.5* 8.4*  PHOS  --  5.5*   GFR:  Estimated Creatinine Clearance: 6.3 mL/min (A) (by C-G formula based on SCr of 7.65 mg/dL (H)). Liver Function Tests: Recent Labs  Lab 06/26/19 0500  ALBUMIN 2.9*   No results for input(s): LIPASE, AMYLASE in the last 168 hours. No results for input(s): AMMONIA in the last 168 hours. Coagulation Profile: No results for input(s): INR, PROTIME in the last 168 hours. Cardiac Enzymes: No results for input(s): CKTOTAL, CKMB, CKMBINDEX, TROPONINI in the last 168 hours. BNP (last 3 results) No results for input(s): PROBNP in the last 8760 hours. HbA1C: Recent Labs    06/26/19 0023  HGBA1C 5.6   CBG: No results for input(s): GLUCAP in the last 168 hours. Lipid Profile: No results for input(s): CHOL, HDL, LDLCALC, TRIG, CHOLHDL, LDLDIRECT in the last 72 hours. Thyroid Function Tests: No results for input(s): TSH, T4TOTAL, FREET4, T3FREE, THYROIDAB in the last 72 hours. Anemia Panel: No results for input(s): VITAMINB12, FOLATE, FERRITIN, TIBC, IRON, RETICCTPCT in the last 72 hours. Sepsis Labs: No results for input(s): PROCALCITON, LATICACIDVEN in the last 168 hours.  Recent Results (from the past 240 hour(s))  SARS CORONAVIRUS 2 (TAT 6-24 HRS) Nasopharyngeal Nasopharyngeal Swab     Status: None   Collection Time: 06/25/19 11:08 PM   Specimen: Nasopharyngeal Swab  Result Value Ref Range Status   SARS Coronavirus 2 NEGATIVE NEGATIVE Final    Comment: (NOTE) SARS-CoV-2 target nucleic acids are NOT DETECTED. The SARS-CoV-2 RNA is generally detectable in upper and lower respiratory specimens during the acute phase of infection. Negative results do not preclude SARS-CoV-2 infection, do not rule out co-infections with other pathogens, and should not be used as the sole basis for treatment or other patient management decisions. Negative results must be combined with clinical observations, patient history, and epidemiological information. The expected result is Negative. Fact Sheet for  Patients: SugarRoll.be Fact Sheet for Healthcare Providers: https://www.woods-mathews.com/ This test is not yet approved or cleared by the [place] FDA and  has been authorized for detection and/or diagnosis of SARS-CoV-2 by FDA under an Emergency Use Authorization (EUA). This EUA will remain  in effect (meaning this test can be used) for the duration of the COVID-19 declaration under Section 56 4(b)(1) of the Act, 21 U.S.C. section 360bbb-3(b)(1), unless the authorization is terminated or revoked sooner. Performed at [facility], [place] [address] [place], [place] [address]          Radiology Studies: Dg Chest [place] 1 View  Result Date: 06/25/2019 CLINICAL DATA:  68 year old female with acute shortness of breath tonight. Dialysis patient. Status post COVID-19 in October. EXAM: PORTABLE CHEST 1 VIEW COMPARISON:  Chest CTA 06/18/2018. 05/17/2019 portable chest and earlier. FINDINGS: Portable AP upright view at 2106 hours. Continued small bilateral pleural effusions. Basilar predominant increased pulmonary interstitial opacity greater than that in October. No consolidation or pneumothorax. Stable cardiac size and mediastinal contours. Calcified aortic atherosclerosis. Visualized tracheal air column is within normal limits. Overall ventilation appears stable from the recent CTA. Paucity of bowel gas in the upper abdomen. No acute osseous abnormality identified. IMPRESSION: Suspected pulmonary edema and small pleural effusions not significantly changed from the  CTA on 06/18/2018. Electronically Signed   By: Genevie Ann M.D.   On: 06/25/2019 21:20        Scheduled Meds: . amLODipine  10 mg Oral QHS  . aspirin EC  81 mg Oral Daily  . atorvastatin  20 mg Oral q1800  . carvedilol  12.5 mg Oral Q M,W,F-1800  . [START ON 06/27/2019] carvedilol  12.5 mg Oral 2 times per day on Sun Tue Thu Sat  . Chlorhexidine Gluconate Cloth  6 each Topical Q0600   . ferric citrate  210-420 mg Oral See admin instructions  . gabapentin  100 mg Oral QHS  . heparin  5,000 Units Subcutaneous Q8H  . insulin aspart  0-6 Units Subcutaneous TID WC  . isosorbide-hydrALAZINE  1 tablet Oral See admin instructions  . mometasone-formoterol  2 puff Inhalation BID  . sodium chloride flush  3 mL Intravenous Q12H   Continuous Infusions:   LOS: 1 day    Time spent: 40 minutes   Cherylyn Sundby, MD [facility] Pager [phone]  If 7PM-7AM, please contact night-coverage [email or web address] Password [facility] 06/26/2019, 9:49 AM

## 2019-06-26 NOTE — ED Notes (Signed)
Breakfast tray ordered 

## 2019-06-26 NOTE — Consult Note (Signed)
   [facility] Inpatient Consult   06/26/2019  Destiny Day Dec 13, 1950 [id number]    Patient is currently active with [facility] for chronic disease management services. Patient has been currently engaged by a [place] Management Coordinator for care coordination/ complex care management.  She was previously engaged by [place], [facility] care management coordinators and [place] community based plan of care has focused on disease management and community resource support.  According to MD notes, patient was admitted for Acute respiratory failure with hypoxia from acute pulmonary edema from noncompliance with the fluid and salt intake.  She was recently hospitalized from 05/17/2019- 05/22/2019 for COVID-19 pneumonia, treated with 5-day course of IV remdesivir, was not given steroids due to history of angioedema secondary to prednisone.   Patient will receive a post hospital call and will be evaluated for assessments and disease process education.   Will continue to follow for disposition and needs with Inpatient [facility] team member and to make aware that [place] [facility] following.  Of note, [facility] Care Management services does not replace or interfere with any services that are needed or arranged by inpatient [facility] care management team.    For additional questions or referrals please contact:  Edwena Felty A. Averee Harb, BSN, RN-BC [facility] Liaison Cell: [phone]

## 2019-06-27 DIAGNOSIS — E1169 Type 2 diabetes mellitus with other specified complication: Secondary | ICD-10-CM

## 2019-06-27 DIAGNOSIS — E785 Hyperlipidemia, unspecified: Secondary | ICD-10-CM

## 2019-06-27 DIAGNOSIS — Z794 Long term (current) use of insulin: Secondary | ICD-10-CM

## 2019-06-27 DIAGNOSIS — E1121 Type 2 diabetes mellitus with diabetic nephropathy: Secondary | ICD-10-CM

## 2019-06-27 DIAGNOSIS — I5033 Acute on chronic diastolic (congestive) heart failure: Secondary | ICD-10-CM

## 2019-06-27 DIAGNOSIS — I10 Essential (primary) hypertension: Secondary | ICD-10-CM

## 2019-06-27 DIAGNOSIS — E1159 Type 2 diabetes mellitus with other circulatory complications: Secondary | ICD-10-CM

## 2019-06-27 LAB — GLUCOSE, CAPILLARY: Glucose-Capillary: 118 mg/dL — ABNORMAL HIGH (ref 70–99)

## 2019-06-27 NOTE — Consult Note (Signed)
   [facility] Inpatient Consult   06/27/2019  Destiny Day Mar 22, 1951 [id number]   Follow-Up Note:   Following this [employer]/ [employer] patient's discharge disposition with 32% extreme high risk score for readmission and hospitalization.  Transition of care CM note shows that patient is from home with husband, and plan is to return home.  Will update [facility] RN CM of current disposition/ need to follow-up as patient transitions back home.  Patient will receive a post hospital call and will be evaluated for assessments and disease process education.   For questions and additional information, please call:  Abigial Newville A. Deaken Jurgens, BSN, RN-BC [facility] Liaison Cell: [phone]

## 2019-06-27 NOTE — Progress Notes (Signed)
[name] to be D/C'd  per MD order. Discussed with the patient and all questions fully answered.  VSS, Skin clean, dry and intact without evidence of skin break down, no evidence of skin tears noted.  IV catheter discontinued intact. Site without signs and symptoms of complications. Dressing and pressure applied.  An After Visit Summary was printed and given to the patient. Patient received prescription.  D/c education completed with patient/family including follow up instructions, medication list, d/c activities limitations if indicated, with other d/c instructions as indicated by MD - patient able to verbalize understanding, all questions fully answered.   Patient instructed to return to ED, call 911, or call MD for any changes in condition.   Patient to be escorted via [place], and D/C home via private auto.

## 2019-06-27 NOTE — Discharge Summary (Signed)
Physician Discharge Summary  Destiny Day OZH:[id number] DOB: Aug 12, 1950 DOA: 06/25/2019  PCP: Velna Hatchet, MD  Admit date: 06/25/2019 Discharge date: 06/27/2019  Admitted From: Home  Disposition:  Home   Recommendations for Outpatient Follow-up:  1. Follow up with regular dialysis tomorrow      Home Health: None  Equipment/Devices: None  Discharge Condition: Good  CODE STATUS: FULL Diet recommendation: Renal  Brief/Interim Summary: Mrs. Mcclane is a 68 y.o. F with ESRD on HD MWF, IDDM, HTN, PVD, anxiety, OSA not on CPAP and dCHF who presented with dyspnea and orthopnea.  In the ER, BP elevated, RR 36, afebrile and requiring 2L O2.  CXR showed edema and effusions.   Admitted for fluid overload.       PRINCIPAL HOSPITAL DIAGNOSIS: Acute on chronic diastolic CHF in setting of ESRD    Discharge Diagnoses:   Acute hypoxic respiratory failure due to acute on chronic diastolic CHF in setting of ESRD Patient admitted and underwent dialysis.  Symptoms resolved afterwards.  Stable to resume MWF schedule.  Postdialysis, the patient was ambulated, her oxygen saturation remained above 88%.  She has NYHA 3 CHF symptoms.     Hypertensive urgency Home antihypertensives were continued, postdialysis her blood pressure was improved.  Diabetes  Hypokalemia          Discharge Instructions  Discharge Instructions    Discharge instructions   Complete by: As directed    From Dr. Loleta Books: You were admitted for fluid overload. You underwent dialysis and were able to come off oxygen.  You should return to taking your home medicines. Go to dialysis as normally scheduled tomorrow.  Your oxygen level while walking was normal. If you would like, buy a "pulse oximeter" to check your oxygen at home A pulse oximeter is a small device that measures your oxygen level Find them at any pharmacy   Your oxygen level here was normal If your oxygen ever drops below 88%  while sitting resting, or while walking down your street -- > write down the numbers and show them to your primary kidney doctor to ask about oxygen   Increase activity slowly   Complete by: As directed      Allergies as of 06/27/2019      Reactions   Adhesive  [tape] Hives   Lisinopril Cough   Prednisone Swelling, Other (See Comments)   Excessive fluid buildup      Medication List    TAKE these medications   albuterol 108 (90 Base) MCG/ACT inhaler Commonly known as: ProAir HFA Inhale 1-2 puffs into the lungs every 6 (six) hours as needed for wheezing or shortness of breath.   amLODipine 10 MG tablet Commonly known as: NORVASC Take 10 mg by mouth at bedtime.   aspirin EC 81 MG tablet Take 81 mg by mouth daily.   atorvastatin 20 MG tablet Commonly known as: LIPITOR TAKE 1 TABLET BY MOUTH EVERY DAY What changed: when to take this   Auryxia 1 GM 210 MG(Fe) tablet Generic drug: ferric citrate Take 210-420 mg by mouth See admin instructions. Take 2 tablets (420 mg) by mouth with each meal & take 1 tablet (210 mg) by mouth with snacks.   budesonide-formoterol 160-4.5 MCG/ACT inhaler Commonly known as: SYMBICORT Inhale 1 puff into the lungs daily as needed (respiratory issues.).   carvedilol 12.5 MG tablet Commonly known as: COREG Take 12.5 mg by mouth See admin instructions. Take 1 tablet (12.5 mg) by mouth once daily in the evening on  Mondays, Wednesdays, & Fridays. (Dialysis) Take 1 tablet (12.5 mg) by mouth twice daily on Sundays, Tuesdays, Thursdays, & Saturdays. (Non Dialysis)   cetirizine 10 MG tablet Commonly known as: ZYRTEC Take 1 tablet (10 mg total) by mouth at bedtime.   cinacalcet 60 MG tablet Commonly known as: SENSIPAR Take 90 mg by mouth See admin instructions. Take 1 tablet (90 mg) by mouth in the evening on Mondays, Wednesdays, & Fridays (dialysis) Take 1 tablet (90 mg) by mouth twice daily on Sundays, Tuesdays, Thursdays, Saturdays. (non dialysis)    diazepam 5 MG tablet Commonly known as: VALIUM Take 5 mg by mouth 2 (two) times daily as needed for anxiety.   fluticasone 50 MCG/ACT nasal spray Commonly known as: FLONASE Place 1 spray into both nostrils daily as needed for allergies or rhinitis.   gabapentin 100 MG capsule Commonly known as: NEURONTIN TAKE 1 CAPSULE (100 MG TOTAL) BY MOUTH AT BEDTIME. WHEN NECESSARY FOR NEUROPATHY PAIN   hydrocerin Crea Apply 616 application topically 2 (two) times daily. What changed: how much to take   HYDROcodone-homatropine 5-1.5 MG/5ML syrup Commonly known as: HYCODAN Take 5 mLs by mouth every 8 (eight) hours as needed for cough.   insulin regular 100 units/mL injection Commonly known as: NOVOLIN R Inject 3-8 Units into the skin See admin instructions. 3 times daily with meals per sliding scale except on Dialysis days, Mon, Wed, Fri   isosorbide-hydrALAZINE 20-37.5 MG tablet Commonly known as: BIDIL Take 1 tablet by mouth 3 (three) times daily. On NON dialysis days pt takes 1 tab 3 times daily, and on Dialysis days, pt takes 1 tab every evning What changed:   when to take this  additional instructions   lidocaine-prilocaine cream Commonly known as: EMLA Apply 1 application topically 3 (three) times a week. 1-2 hours prior to dialysis   multivitamin Tabs tablet Take 1 tablet by mouth daily.   omeprazole 20 MG capsule Commonly known as: PRILOSEC Take 20 mg by mouth daily.   ondansetron 4 MG tablet Commonly known as: ZOFRAN Take 1 tablet (4 mg total) by mouth every 6 (six) hours as needed for nausea.   senna 8.6 MG tablet Commonly known as: SENOKOT Take 1 tablet by mouth as needed for constipation.   Vitamin D (Ergocalciferol) 1.25 MG (50000 UT) Caps capsule Commonly known as: DRISDOL Take 50,000 Units by mouth every Sunday. Takes on Sunday       Allergies  Allergen Reactions  . Adhesive  [Tape] Hives  . Lisinopril Cough  . Prednisone Swelling and Other (See  Comments)    Excessive fluid buildup    Consultations:  Nephrology   Procedures/Studies: Dg Chest Port 1 View  Result Date: 06/25/2019 CLINICAL DATA:  68 year old female with acute shortness of breath tonight. Dialysis patient. Status post COVID-19 in October. EXAM: PORTABLE CHEST 1 VIEW COMPARISON:  Chest CTA 06/18/2018. 05/17/2019 portable chest and earlier. FINDINGS: Portable AP upright view at 2106 hours. Continued small bilateral pleural effusions. Basilar predominant increased pulmonary interstitial opacity greater than that in October. No consolidation or pneumothorax. Stable cardiac size and mediastinal contours. Calcified aortic atherosclerosis. Visualized tracheal air column is within normal limits. Overall ventilation appears stable from the recent CTA. Paucity of bowel gas in the upper abdomen. No acute osseous abnormality identified. IMPRESSION: Suspected pulmonary edema and small pleural effusions not significantly changed from the CTA on 06/18/2018. Electronically Signed   By: Genevie Ann M.D.   On: 06/25/2019 21:20  Subjective: No chest pain, fever, confusion, sputum.  She still has some dyspnea with exertion, but this is back to her baseline.     Discharge Exam: Vitals:   06/26/19 2013 06/27/19 0622  BP: 132/61 (!) 155/71  Pulse: 79 75  Resp: 15   Temp: 98.6 F (37 C) 97.8 F (36.6 C)  SpO2: 100% 99%   Vitals:   06/26/19 1051 06/26/19 1323 06/26/19 2013 06/27/19 0622  BP: (!) 159/92 (!) 157/68 132/61 (!) 155/71  Pulse: 86 81 79 75  Resp: (!) 22 18 15    Temp: 97.8 F (36.6 C) 98.6 F (37 C) 98.6 F (37 C) 97.8 F (36.6 C)  TempSrc: Oral Oral Oral Oral  SpO2: 98% 100% 100% 99%  Weight:      Height:        General: Pt is alert, awake, not in acute distress Cardiovascular: RRR, nl S1-S2, no murmurs appreciated.   No LE edema.   Respiratory: Normal respiratory rate and rhythm.  CTAB without rales or wheezes. Abdominal: Abdomen soft and non-tender.   No distension or HSM.   Neuro/Psych: Strength symmetric in upper and lower extremities.  Judgment and insight appear normal.   The results of significant diagnostics from this hospitalization (including imaging, microbiology, ancillary and laboratory) are listed below for reference.     Microbiology: Recent Results (from the past 240 hour(s))  SARS CORONAVIRUS 2 (TAT 6-24 HRS) Nasopharyngeal Nasopharyngeal Swab     Status: None   Collection Time: 06/25/19 11:08 PM   Specimen: Nasopharyngeal Swab  Result Value Ref Range Status   SARS Coronavirus 2 NEGATIVE NEGATIVE Final    Comment: (NOTE) SARS-CoV-2 target nucleic acids are NOT DETECTED. The SARS-CoV-2 RNA is generally detectable in upper and lower respiratory specimens during the acute phase of infection. Negative results do not preclude SARS-CoV-2 infection, do not rule out co-infections with other pathogens, and should not be used as the sole basis for treatment or other patient management decisions. Negative results must be combined with clinical observations, patient history, and epidemiological information. The expected result is Negative. Fact Sheet for Patients: SugarRoll.be Fact Sheet for Healthcare Providers: https://www.woods-mathews.com/ This test is not yet approved or cleared by the [place] FDA and  has been authorized for detection and/or diagnosis of SARS-CoV-2 by FDA under an Emergency Use Authorization (EUA). This EUA will remain  in effect (meaning this test can be used) for the duration of the COVID-19 declaration under Section 56 4(b)(1) of the Act, 21 U.S.C. section 360bbb-3(b)(1), unless the authorization is terminated or revoked sooner. Performed at [facility], [place] [address] [place], [place] [address]      Labs: BNP (last 3 results) No results for input(s): BNP in the last 8760 hours. Basic Metabolic Panel: Recent Labs  Lab 06/25/19 2047  06/26/19 0500  NA 133* 135  K 3.2* 3.1*  CL 92* 94*  CO2 26 26  GLUCOSE 117* 102*  BUN 31* 35*  CREATININE 7.24* 7.65*  CALCIUM 8.5* 8.4*  PHOS  --  5.5*   Liver Function Tests: Recent Labs  Lab 06/26/19 0500  ALBUMIN 2.9*   No results for input(s): LIPASE, AMYLASE in the last 168 hours. No results for input(s): AMMONIA in the last 168 hours. CBC: Recent Labs  Lab 06/25/19 2047 06/26/19 0500  WBC 7.6 7.1  NEUTROABS 5.0  --   HGB 12.5 11.2*  HCT 39.1 34.2*  MCV 97.3 95.3  PLT 315 294   Cardiac Enzymes: No  results for input(s): CKTOTAL, CKMB, CKMBINDEX, TROPONINI in the last 168 hours. BNP: Invalid input(s): POCBNP CBG: Recent Labs  Lab 06/26/19 1321 06/26/19 1634 06/26/19 2015 06/27/19 0722  GLUCAP 106* 190* 105* 118*   D-Dimer No results for input(s): DDIMER in the last 72 hours. Hgb A1c Recent Labs    06/26/19 0023  HGBA1C 5.6   Lipid Profile No results for input(s): CHOL, HDL, LDLCALC, TRIG, CHOLHDL, LDLDIRECT in the last 72 hours. Thyroid function studies No results for input(s): TSH, T4TOTAL, T3FREE, THYROIDAB in the last 72 hours.  Invalid input(s): FREET3 Anemia work up No results for input(s): VITAMINB12, FOLATE, FERRITIN, TIBC, IRON, RETICCTPCT in the last 72 hours. Urinalysis    Component Value Date/Time   COLORURINE YELLOW 07/07/2016 0914   APPEARANCEUR CLOUDY (A) 07/07/2016 0914   LABSPEC >1.030 (H) 07/07/2016 0914   PHURINE 5.5 07/07/2016 0914   GLUCOSEU NEGATIVE 07/07/2016 0914   HGBUR SMALL (A) 07/07/2016 0914   BILIRUBINUR SMALL (A) 07/07/2016 0914   KETONESUR 15 (A) 07/07/2016 0914   PROTEINUR >300 (A) 07/07/2016 0914   UROBILINOGEN 0.2 07/30/2014 2000   NITRITE NEGATIVE 07/07/2016 0914   LEUKOCYTESUR SMALL (A) 07/07/2016 0914   Sepsis Labs Invalid input(s): PROCALCITONIN,  WBC,  LACTICIDVEN Microbiology Recent Results (from the past 240 hour(s))  SARS CORONAVIRUS 2 (TAT 6-24 HRS) Nasopharyngeal Nasopharyngeal Swab      Status: None   Collection Time: 06/25/19 11:08 PM   Specimen: Nasopharyngeal Swab  Result Value Ref Range Status   SARS Coronavirus 2 NEGATIVE NEGATIVE Final    Comment: (NOTE) SARS-CoV-2 target nucleic acids are NOT DETECTED. The SARS-CoV-2 RNA is generally detectable in upper and lower respiratory specimens during the acute phase of infection. Negative results do not preclude SARS-CoV-2 infection, do not rule out co-infections with other pathogens, and should not be used as the sole basis for treatment or other patient management decisions. Negative results must be combined with clinical observations, patient history, and epidemiological information. The expected result is Negative. Fact Sheet for Patients: SugarRoll.be Fact Sheet for Healthcare Providers: https://www.woods-mathews.com/ This test is not yet approved or cleared by the [place] FDA and  has been authorized for detection and/or diagnosis of SARS-CoV-2 by FDA under an Emergency Use Authorization (EUA). This EUA will remain  in effect (meaning this test can be used) for the duration of the COVID-19 declaration under Section 56 4(b)(1) of the Act, 21 U.S.C. section 360bbb-3(b)(1), unless the authorization is terminated or revoked sooner. Performed at [facility], [place] [address], [place], [place] [address]      Time coordinating discharge: 35 minutes      SIGNED:   Edwin Dada, MD  [facility] 06/27/2019, 4:10 PM

## 2019-06-27 NOTE — Progress Notes (Signed)
[facility] Progress Note   Subjective:  Seen in room - tells me that she is being discharged today. Feels much better after HD - still weak with exertion. Dry weight will be lowered on d/c.  Objective Vitals:   06/26/19 1051 06/26/19 1323 06/26/19 2013 06/27/19 0622  BP: (!) 159/92 (!) 157/68 132/61 (!) 155/71  Pulse: 86 81 79 75  Resp: (!) 22 18 15    Temp: 97.8 F (36.6 C) 98.6 F (37 C) 98.6 F (37 C) 97.8 F (36.6 C)  TempSrc: Oral Oral Oral Oral  SpO2: 98% 100% 100% 99%  Weight:      Height:       Physical Exam General: Well appearing woman, NAD Heart: RRR Lungs: CTAB Abdomen: soft, non-tender Extremities: No LE edema; leg braces in place Dialysis Access: L AVG + bruit  Additional Objective Labs: Basic Metabolic Panel: Recent Labs  Lab 06/25/19 2047 06/26/19 0500  NA 133* 135  K 3.2* 3.1*  CL 92* 94*  CO2 26 26  GLUCOSE 117* 102*  BUN 31* 35*  CREATININE 7.24* 7.65*  CALCIUM 8.5* 8.4*  PHOS  --  5.5*   Liver Function Tests: Recent Labs  Lab 06/26/19 0500  ALBUMIN 2.9*   CBC: Recent Labs  Lab 06/25/19 2047 06/26/19 0500  WBC 7.6 7.1  NEUTROABS 5.0  --   HGB 12.5 11.2*  HCT 39.1 34.2*  MCV 97.3 95.3  PLT 315 294   Studies/Results: Dg Chest Port 1 View  Result Date: 06/25/2019 CLINICAL DATA:  68 year old female with acute shortness of breath tonight. Dialysis patient. Status post COVID-19 in October. EXAM: PORTABLE CHEST 1 VIEW COMPARISON:  Chest CTA 06/18/2018. 05/17/2019 portable chest and earlier. FINDINGS: Portable AP upright view at 2106 hours. Continued small bilateral pleural effusions. Basilar predominant increased pulmonary interstitial opacity greater than that in October. No consolidation or pneumothorax. Stable cardiac size and mediastinal contours. Calcified aortic atherosclerosis. Visualized tracheal air column is within normal limits. Overall ventilation appears stable from the recent CTA. Paucity of bowel gas in  the upper abdomen. No acute osseous abnormality identified. IMPRESSION: Suspected pulmonary edema and small pleural effusions not significantly changed from the CTA on 06/18/2018. Electronically Signed   By: Genevie Ann M.D.   On: 06/25/2019 21:20   Medications:  . amLODipine  10 mg Oral QHS  . aspirin EC  81 mg Oral Daily  . atorvastatin  20 mg Oral q1800  . carvedilol  12.5 mg Oral Q M,W,F-1800  . carvedilol  12.5 mg Oral 2 times per day on Sun Tue Thu Sat  . Chlorhexidine Gluconate Cloth  6 each Topical Q0600  . ferric citrate  420 mg Oral TID WC  . gabapentin  100 mg Oral QHS  . heparin  5,000 Units Subcutaneous Q8H  . insulin aspart  0-6 Units Subcutaneous TID WC  . isosorbide-hydrALAZINE  1 tablet Oral Q M,W,F-2000  . isosorbide-hydrALAZINE  1 tablet Oral 3 times per day on Sun Tue Thu Sat  . mometasone-formoterol  2 puff Inhalation BID  . sodium chloride flush  3 mL Intravenous Q12H    Dialysis Orders: [facility] MWF 4h 66.5kg 2/2.25 bath  L AVG Hep 2500 w/ [id number]   Assessment/ Plan: 1. SOB - pulm edema by CXR. Improved s/p HD - got down to 65kg. She thinks could possible get lower - still very weak and mild dyspnea on exertion. 2. ESRD - Continue HD per MWF - next tomorrow (as outpatient). 3. HTN -  cont home meds as needed 4. Recent COVID infection - in hospital from 10/21- 05/22/19. S/p mult negative tests - cleared from [facility]. 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 DM on insulin 7. Anemia ckd - Hb 11.2, no esa needed now  Destiny Day, Hershal Coria 06/27/2019, 9:20 AM  [employer] Pager: [phone]

## 2019-06-27 NOTE — TOC Initial Note (Signed)
Transition of Care [facility]) - Initial/Assessment Note    Patient Details  Name: Destiny Day MRN: [id number] Date of Birth: 1951-02-10  Transition [facility]) CM/SW Contact:    Marilu Favre, RN Phone Number: 06/27/2019, 8:55 AM  Clinical Narrative:                 Confirmed face sheet information with patient at bedside. From home with husband.   Await ambulation saturation note to see if patient will need home oxygen. Patient voiced understanding.  Expected Discharge Plan: Home/Self Care     Patient Goals and CMS Choice Patient states their goals for this hospitalization and ongoing recovery are:: to return to home CMS Medicare.gov Compare Post Acute Care list provided to:: Patient Choice offered to / list presented to : NA  Expected Discharge Plan and Services Expected Discharge Plan: Home/Self Care       Living arrangements for the past 2 months: Single Family Home Expected Discharge Date: 06/27/19                         [facility] Arranged: NA          Prior Living Arrangements/Services Living arrangements for the past 2 months: Single Family Home Lives with:: Spouse Patient language and need for interpreter reviewed:: Yes                 Activities of Daily Living      Permission Sought/Granted   Permission granted to share information with : No              Emotional Assessment Appearance:: Appears stated age Attitude/Demeanor/Rapport: Engaged Affect (typically observed): Accepting Orientation: : Oriented to Self, Oriented to Place, Oriented to  Time, Oriented to Situation Alcohol / Substance Use: Not Applicable Psych Involvement: No (comment)  Admission diagnosis:  Acute pulmonary edema (HCC) [J81.0] ESRD (end stage renal disease) ([place]) [N18.6] Acute respiratory failure ([place]) [J96.00] Patient Active Problem List   Diagnosis Date Noted  . Acute respiratory failure ([place]) 06/26/2019  . Paroxysmal atrial fibrillation ([place])  06/13/2019  . CAP (community acquired pneumonia) 05/17/2019  . Fluid overload 06/18/2018  . Old bucket handle tear of lateral meniscus of right knee   . Old peripheral tear of medial meniscus of right knee   . Septic arthritis of knee, right ([place]) 05/06/2018  . Degenerative tear of posterior horn of medial meniscus, right   . Old complex tear of lateral meniscus of right knee   . Loose body in knee, right   . Acute pulmonary edema ([place]) 02/21/2018  . 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[place]) 02/03/2018  . End-stage renal disease on hemodialysis ([place])   . Respiratory failure ([place]) 01/21/2018  . Achilles tendon contracture, right 11/09/2017  . Labile blood glucose   . Anemia of infection and chronic disease   . Hypertension associated with diabetes ([place])   . Black stool   . Right sided weakness   . Subdural hemorrhage following injury ([place]) 08/30/2017  . Diabetic peripheral neuropathy ([place])   . Chronic obstructive pulmonary disease ([place])   . Seizure prophylaxis   . Subdural hematoma ([place]) 08/24/2017  . Traumatic subdural hematoma ([place]) 08/23/2017  . Fall   . SDH (subdural hematoma) ([place])   . Acute respiratory failure with hypoxia ([place])   . Diabetes mellitus type 2 in nonobese (HCC)   . Leukocytosis   . Labile blood pressure   . Generalized anxiety disorder   .  Debility 08/16/2017  . Amputated great toe of right foot ([place])   . Amputated toe of left foot ([place])   . Post-operative pain   . ESRD on dialysis ([place])   . Diabetes mellitus ([place])   . Anxiety state   . Benign essential HTN   . Acute blood loss anemia   . Anemia of chronic disease   . Diarrhea, unspecified 01/16/2017  . Dependence on renal dialysis ([place]) 01/06/2017  . Idiopathic chronic venous hypertension of both lower extremities with inflammation 10/13/2016  . S/P transmetatarsal amputation of foot, left ([place]) 09/21/2016  . Onychomycosis 08/15/2016  . Age-related osteoporosis without current  pathological fracture 07/11/2016  . Cervical disc disorder, unspecified, unspecified cervical region 07/11/2016  . Diverticulitis of intestine, part unspecified, without perforation or abscess without bleeding 07/11/2016  . Unspecified abdominal hernia without obstruction or gangrene 07/11/2016  . PAD (peripheral artery disease) ([place]) 07/08/2016  . Cellulitis of left toe 06/29/2016  . Atherosclerosis of native artery of left lower extremity with gangrene ([place]) 06/23/2016  . Complication of vascular dialysis catheter 02/29/2016  . Coagulation defect, unspecified ([place]) 10/16/2015  . GERD (gastroesophageal reflux disease) 10/07/2015  . ARF (acute renal failure) ([place])   . Hypothermia 06/12/2015  . Acute on chronic renal failure ([place]) 06/12/2015  . CHF (congestive heart failure) ([place]) 07/30/2014  . CKD (chronic kidney disease) stage 4, GFR 15-29 ml/min (HCC) 06/27/2014  . Hypertensive heart disease 05/25/2013  . CKD (chronic kidney disease) stage 3, GFR 30-59 ml/min 05/25/2013  . Pulmonary edema 05/23/2013  . Type 2 diabetes mellitus with diabetic nephropathy ([name]) 05/23/2013  . Type 2 diabetes mellitus with hyperosmolar nonketotic hyperglycemia ([place]) 04/13/2013  . Acute on chronic diastolic heart failure ([place]) 04/13/2013  . UGI bleed 03/31/2013  . Hypokalemia 08/24/2012  . Type II or unspecified type diabetes mellitus without mention of complication, not stated as uncontrolled 12/27/2007  . Hyperlipidemia associated with type 2 diabetes mellitus ([place]) 12/27/2007  . Allergic rhinitis due to pollen 12/27/2007  . Asthma 12/27/2007   PCP:  Velna Hatchet, MD Pharmacy:   [id number] - [place], [place] [address] [place] [address] Phone: [phone] Fax: [phone]  [place], [place] [address] Phone: [phone] Fax:  [facility] [place] - [place], [place], [place] [address], [place] [address] Phone: [phone] Fax: [phone]     Social Determinants of Health ([place]) Interventions    Readmission Risk Interventions No flowsheet data found.

## 2019-06-29 ENCOUNTER — Other Ambulatory Visit: Payer: Self-pay | Admitting: *Deleted

## 2019-06-29 NOTE — Patient Outreach (Addendum)
[place] ([place]) Care Management  06/29/2019  Destiny Day [birth year] [id number]   Follow up to [facility] complex care patient after 06/25/19 admission for acuter respiratory failure with hypoxia/CHF  Patient is able to verify HIPAA Reviewed and addressed the purpose of the follow up call with the patient  Consent: [facility] RN CM reviewed [facility] services with patient. Patient gave verbal consent for services.  Destiny Day was able to remain out of the hospital for 33 days prior to her re admission on 06/25/19   Destiny Day reports she was re admitted to the hospital after craving and eating ice (No teeth so she was sucking on ice) during the Thanksgiving holiday, going over her fluid restriction limit (non compliance) and developing CHF s/s of sob, acute respiratory failure. [facility] RN CM counseled her on use of increase fluids and diet. Compliance was encouraged She reports the last episode of this kind was about a year ago. She reports feeling much better today. She confirms she was taken to ED via EMS on the evening of 06/25/19 (s/s started around 1900-1930), held in ED and completed dialysis the next morning on 06/26/19 prior to d/c on 06/27/19 .   She reports having her prescribed medicines at home. She has made and appointment with [facility] office for Tuesday July 05 2019 at 1330 for a follow up. She is not needing home health or other DME. She is denying any further s/s of CHF at this time.   She confirms she was tested 2-3 time for covid during this time and each test was negative.  She confirms Dr Loleta Books and she did discussed the phases of the upcoming covid vaccine. Destiny Day confirms she has had the flu and pneumonia vaccines.   [facility] RN CM again reviewed CHF action plan to include weighing, s/s to report and who to call. She confirms she is not on a diuretic related to he CKD status. She reports poor balance as she has toes missing therefore issues with weighing  [facility] RN CM  reviewed scooter DME process. She nor [facility] RN CM has not been contacted by her primary care MD office after [facility] RN CM left a voice message for Dr [facility] RN on 06/09/19 requesting assistance with a scooter. She reports she is willing to attempt to work with [facility] to find out her options for getting a scooter She reminds [facility] RN CM she is on a fixed monthly income in which she has to pay on her "four insurances, car payment, prescriptions" and household bills.    She voiced interest in personal care services to help her and her husband with some cleaning. She reports with the covid pandemic her children and family nor church members visit much. [facility] RN CM discussed the general out of pocket expense for personal care services without medicaid coverage. She reports being on a monthly fixed income and not being able to afford an out of pocket expense. She reports a previous DSS visit with being "one dollar over the limit" for services for her and her husband. [facility] RN CM discussed [employer], [employer] and offered the number and/or referral to [facility] but she informs RN CM she prefers during the pandemic not to have others in her home and "they will eventually ask for money"     Plans [facility] RN CM will follow up with Destiny Day within the next21-28 days Pt encouraged to return a call to [facility] RN CM prn  Routed note to MD/NP/PA  [facility] CM Care Plan Problem One     Most Recent Value  Care Plan Problem One  Risk for re admission related to PNA/Covid/respiratory failure/CHF  Role Documenting the Problem One  Care Management Telephonic Coordinator  Care Plan for Problem One  Active  [facility] Long Term Goal   Over the next 31 days, patient will not experience hospital readmission, as evidence by patient reporting and review of EMR during [facility] RN CCM outreach  [place] Term Goal Start Date  06/29/19  Interventions for Problem One Long Term Goal  assessed for reason for admission, present CHF  s/s, counseled her on use of increase fluids and diet. Compliance was encouraged, assessed for pcp f/u  [facility] CM Short Term Goal #1   over the next 10 days patient will have her oxygen saturation checked at dialysis and document the finding to report during the next follow up call  [facility] CM Short Term Goal #1 Start Date  05/24/19  [facility] CM Short Term Goal #1 Met Date  05/30/19       Joelene Millin L. Lavina Hamman, RN, BSN, [place] Coordinator Office number [phone] Mobile number [phone]  Main [facility] number [phone] Fax number [phone]

## 2019-07-03 ENCOUNTER — Other Ambulatory Visit: Payer: Self-pay | Admitting: *Deleted

## 2019-07-03 NOTE — Patient Outreach (Signed)
[facility]) Care Management  07/03/2019  Destiny Day [birth year] [id number]   Care coordination  [facility] RN CM left a voice message for Lattie Haw at Dr [facility] office prior to [name]'s scheduled appointment on 07/04/19  She and [facility] RN CM discussed having primary MD to assist with scooter [facility] RN CM left a message on 06/09/19 requesting assist with a scooter for pt but without a response to [facility] RN CM nor [facility]   Plans [facility] RN CM will follow up with [name] within the next14- 21 days Pt encouraged to return a call to [facility] RN CM prn  Routed note to MD/NP/PA   Graham. Lavina Hamman, RN, BSN, [place] Coordinator Office number [phone] Mobile number [phone]  Main [facility] number [phone] Fax number [phone]

## 2019-07-04 DIAGNOSIS — N186 End stage renal disease: Secondary | ICD-10-CM | POA: Diagnosis not present

## 2019-07-04 DIAGNOSIS — Z794 Long term (current) use of insulin: Secondary | ICD-10-CM | POA: Diagnosis not present

## 2019-07-04 DIAGNOSIS — I509 Heart failure, unspecified: Secondary | ICD-10-CM | POA: Diagnosis not present

## 2019-07-04 DIAGNOSIS — F419 Anxiety disorder, unspecified: Secondary | ICD-10-CM | POA: Diagnosis not present

## 2019-07-04 DIAGNOSIS — E1122 Type 2 diabetes mellitus with diabetic chronic kidney disease: Secondary | ICD-10-CM | POA: Diagnosis not present

## 2019-07-04 DIAGNOSIS — U071 COVID-19: Secondary | ICD-10-CM | POA: Diagnosis not present

## 2019-07-04 DIAGNOSIS — J45909 Unspecified asthma, uncomplicated: Secondary | ICD-10-CM | POA: Diagnosis not present

## 2019-07-04 DIAGNOSIS — E785 Hyperlipidemia, unspecified: Secondary | ICD-10-CM | POA: Diagnosis not present

## 2019-07-04 DIAGNOSIS — J9621 Acute and chronic respiratory failure with hypoxia: Secondary | ICD-10-CM | POA: Diagnosis not present

## 2019-07-04 DIAGNOSIS — I132 Hypertensive heart and chronic kidney disease with heart failure and with stage 5 chronic kidney disease, or end stage renal disease: Secondary | ICD-10-CM | POA: Diagnosis not present

## 2019-07-17 ENCOUNTER — Other Ambulatory Visit: Payer: Self-pay | Admitting: *Deleted

## 2019-07-17 NOTE — Patient Outreach (Signed)
   [facility]) Care Management  07/17/2019  Destiny Day 18-Aug-1950 [id number]   Follow up to [facility] complex care patient   Follow up Outreach attempt # 1  week of 07/17/19  No answer at her preferred number her mobile. [facility] RN CM left HIPAA compliant voicemail message along with CM's contact info.   Plan: [facility] RN CM scheduled this patient for another call attempt within 4-7 business days  Dalisa Forrer L. Lavina Hamman, RN, BSN, [place] Coordinator Office number [phone] Mobile number [phone]  Main [facility] number [phone] Fax number [phone]

## 2019-07-18 ENCOUNTER — Other Ambulatory Visit: Payer: Self-pay

## 2019-07-18 ENCOUNTER — Other Ambulatory Visit: Payer: Self-pay | Admitting: *Deleted

## 2019-07-18 NOTE — Patient Outreach (Signed)
[facility])Care Management  07/18/2019  Destiny Day [birth year] [id number]   Follow up to [facility] complex care patient -completion of 14 day Transition of care services  Follow up Outreach attempt #2 for week of 07/17/19 successful  Patient is able to verify HIPAA Reviewed and addressed the purpose of the follow up call with the patient-F/U of 14 day transition of care services after discharge from hospital on 12/1/120 with CHF, F/U MD visit, CHF assessment and reminder to be cautious during the holiday with fluid restriction and increase sodium foods  Consent: [facility] RN  CM reviewed Destiny Day with patient. Patient gave verbal consent for services.  Dialysis is  going well She is going her regular Monday, Wednesday HD visits but has been changed from Friday to Saturday HD visits  For the next 2 weeks during the Christmas/New year holidays  Follow up with 07/04/19 MD office visit-She saw the PA during her visit She reports the scooter was not discussed but she is reconsidering not getting one as she and her husband discussed how the scooter would need to be transported on her car,  She does not have equipment on her car for transport of a scooter 07/31/18 is the next primary care MD follow up but she is considering [employer] She visited with her husband  She received her covid care package and voices appreciation  CHF Destiny Day was cautioned to follow her diet and fluid restriction during the holidays.She voices understanding. She reviewed her meal for Thanksgiving which had included ham. [facility] RN CM discussed high sodium content of ham and natural diuretic foods like cucumbers, lemons, beets, asparagus, apple cider vinegar, Kale, cilantro, and ginger . She states she is also able to view this on her mobile phone and will try some of these holistic foods. She denies s/s of CHF at this time   2021 Goals Destiny Day reports she would like to be more mobile and work  with her orthopedic MD, Dr Sharol Given on DME (brace, new medical shoe) for her left   Plans [facility] RN CM will follow up with Destiny Day to assess dx s/s, assess for further needs and to work on her goals and   [place] Problem One     Most Recent Value  Care Plan Problem One  Risk for re admission related to PNA/Covid/respiratory failure/CHF  Role Documenting the Problem One  Care Management Telephonic Coordinator  Care Plan for Problem One  Active  [facility] Long Term Goal   Over the next 31 days, patient will not experience hospital readmission, as evidence by patient reporting and review of EMR during [place] outreach  [place] Term Goal Start Date  06/29/19  [facility] CM Short Term Goal #1   over the next 10 days patient will have her oxygen saturation checked at dialysis and document the finding to report during the next follow up call  [facility] CM Short Term Goal #1 Start Date  05/24/19  [facility] CM Short Term Goal #1 Met Date  05/30/19     Destiny Millin L. Lavina Hamman, RN, BSN, [place] Coordinator Office number [phone] Mobile number [phone]  Main [facility] number [phone] Fax number [phone]

## 2019-07-28 DIAGNOSIS — I159 Secondary hypertension, unspecified: Secondary | ICD-10-CM | POA: Diagnosis not present

## 2019-07-28 DIAGNOSIS — Z992 Dependence on renal dialysis: Secondary | ICD-10-CM | POA: Diagnosis not present

## 2019-07-28 DIAGNOSIS — N186 End stage renal disease: Secondary | ICD-10-CM | POA: Diagnosis not present

## 2019-07-29 DIAGNOSIS — N186 End stage renal disease: Secondary | ICD-10-CM | POA: Diagnosis not present

## 2019-07-29 DIAGNOSIS — E876 Hypokalemia: Secondary | ICD-10-CM | POA: Diagnosis not present

## 2019-07-29 DIAGNOSIS — D509 Iron deficiency anemia, unspecified: Secondary | ICD-10-CM | POA: Diagnosis not present

## 2019-07-29 DIAGNOSIS — N2581 Secondary hyperparathyroidism of renal origin: Secondary | ICD-10-CM | POA: Diagnosis not present

## 2019-07-29 DIAGNOSIS — Z992 Dependence on renal dialysis: Secondary | ICD-10-CM | POA: Diagnosis not present

## 2019-07-29 DIAGNOSIS — D631 Anemia in chronic kidney disease: Secondary | ICD-10-CM | POA: Diagnosis not present

## 2019-07-31 DIAGNOSIS — D631 Anemia in chronic kidney disease: Secondary | ICD-10-CM | POA: Diagnosis not present

## 2019-07-31 DIAGNOSIS — N186 End stage renal disease: Secondary | ICD-10-CM | POA: Diagnosis not present

## 2019-07-31 DIAGNOSIS — Z992 Dependence on renal dialysis: Secondary | ICD-10-CM | POA: Diagnosis not present

## 2019-07-31 DIAGNOSIS — N2581 Secondary hyperparathyroidism of renal origin: Secondary | ICD-10-CM | POA: Diagnosis not present

## 2019-07-31 DIAGNOSIS — E876 Hypokalemia: Secondary | ICD-10-CM | POA: Diagnosis not present

## 2019-07-31 DIAGNOSIS — D509 Iron deficiency anemia, unspecified: Secondary | ICD-10-CM | POA: Diagnosis not present

## 2019-08-01 DIAGNOSIS — N186 End stage renal disease: Secondary | ICD-10-CM | POA: Diagnosis not present

## 2019-08-01 DIAGNOSIS — J309 Allergic rhinitis, unspecified: Secondary | ICD-10-CM | POA: Diagnosis not present

## 2019-08-01 DIAGNOSIS — E1151 Type 2 diabetes mellitus with diabetic peripheral angiopathy without gangrene: Secondary | ICD-10-CM | POA: Diagnosis not present

## 2019-08-01 DIAGNOSIS — F17200 Nicotine dependence, unspecified, uncomplicated: Secondary | ICD-10-CM | POA: Diagnosis not present

## 2019-08-01 DIAGNOSIS — K219 Gastro-esophageal reflux disease without esophagitis: Secondary | ICD-10-CM | POA: Diagnosis not present

## 2019-08-01 DIAGNOSIS — F419 Anxiety disorder, unspecified: Secondary | ICD-10-CM | POA: Diagnosis not present

## 2019-08-01 DIAGNOSIS — I739 Peripheral vascular disease, unspecified: Secondary | ICD-10-CM | POA: Diagnosis not present

## 2019-08-01 DIAGNOSIS — E663 Overweight: Secondary | ICD-10-CM | POA: Diagnosis not present

## 2019-08-01 DIAGNOSIS — E1122 Type 2 diabetes mellitus with diabetic chronic kidney disease: Secondary | ICD-10-CM | POA: Diagnosis not present

## 2019-08-01 DIAGNOSIS — I509 Heart failure, unspecified: Secondary | ICD-10-CM | POA: Diagnosis not present

## 2019-08-01 DIAGNOSIS — I132 Hypertensive heart and chronic kidney disease with heart failure and with stage 5 chronic kidney disease, or end stage renal disease: Secondary | ICD-10-CM | POA: Diagnosis not present

## 2019-08-01 DIAGNOSIS — J449 Chronic obstructive pulmonary disease, unspecified: Secondary | ICD-10-CM | POA: Diagnosis not present

## 2019-08-02 DIAGNOSIS — E1122 Type 2 diabetes mellitus with diabetic chronic kidney disease: Secondary | ICD-10-CM | POA: Diagnosis not present

## 2019-08-02 DIAGNOSIS — D509 Iron deficiency anemia, unspecified: Secondary | ICD-10-CM | POA: Diagnosis not present

## 2019-08-02 DIAGNOSIS — Z992 Dependence on renal dialysis: Secondary | ICD-10-CM | POA: Diagnosis not present

## 2019-08-02 DIAGNOSIS — N186 End stage renal disease: Secondary | ICD-10-CM | POA: Diagnosis not present

## 2019-08-02 DIAGNOSIS — E876 Hypokalemia: Secondary | ICD-10-CM | POA: Diagnosis not present

## 2019-08-02 DIAGNOSIS — D631 Anemia in chronic kidney disease: Secondary | ICD-10-CM | POA: Diagnosis not present

## 2019-08-02 DIAGNOSIS — N2581 Secondary hyperparathyroidism of renal origin: Secondary | ICD-10-CM | POA: Diagnosis not present

## 2019-08-03 ENCOUNTER — Telehealth: Payer: Self-pay | Admitting: Orthopedic Surgery

## 2019-08-03 NOTE — Telephone Encounter (Signed)
Patient called and stated she needs new ortho shoes and wants to know if an order can be sent in or does she need to make an appt?  Please call patient at [phone]

## 2019-08-04 DIAGNOSIS — N2581 Secondary hyperparathyroidism of renal origin: Secondary | ICD-10-CM | POA: Diagnosis not present

## 2019-08-04 DIAGNOSIS — D509 Iron deficiency anemia, unspecified: Secondary | ICD-10-CM | POA: Diagnosis not present

## 2019-08-04 DIAGNOSIS — D631 Anemia in chronic kidney disease: Secondary | ICD-10-CM | POA: Diagnosis not present

## 2019-08-04 DIAGNOSIS — Z992 Dependence on renal dialysis: Secondary | ICD-10-CM | POA: Diagnosis not present

## 2019-08-04 DIAGNOSIS — N186 End stage renal disease: Secondary | ICD-10-CM | POA: Diagnosis not present

## 2019-08-04 DIAGNOSIS — E876 Hypokalemia: Secondary | ICD-10-CM | POA: Diagnosis not present

## 2019-08-07 DIAGNOSIS — N2581 Secondary hyperparathyroidism of renal origin: Secondary | ICD-10-CM | POA: Diagnosis not present

## 2019-08-07 DIAGNOSIS — N186 End stage renal disease: Secondary | ICD-10-CM | POA: Diagnosis not present

## 2019-08-07 DIAGNOSIS — Z992 Dependence on renal dialysis: Secondary | ICD-10-CM | POA: Diagnosis not present

## 2019-08-07 DIAGNOSIS — D631 Anemia in chronic kidney disease: Secondary | ICD-10-CM | POA: Diagnosis not present

## 2019-08-07 DIAGNOSIS — D509 Iron deficiency anemia, unspecified: Secondary | ICD-10-CM | POA: Diagnosis not present

## 2019-08-07 DIAGNOSIS — E876 Hypokalemia: Secondary | ICD-10-CM | POA: Diagnosis not present

## 2019-08-09 DIAGNOSIS — N186 End stage renal disease: Secondary | ICD-10-CM | POA: Diagnosis not present

## 2019-08-09 DIAGNOSIS — D631 Anemia in chronic kidney disease: Secondary | ICD-10-CM | POA: Diagnosis not present

## 2019-08-09 DIAGNOSIS — N2581 Secondary hyperparathyroidism of renal origin: Secondary | ICD-10-CM | POA: Diagnosis not present

## 2019-08-09 DIAGNOSIS — E876 Hypokalemia: Secondary | ICD-10-CM | POA: Diagnosis not present

## 2019-08-09 DIAGNOSIS — D509 Iron deficiency anemia, unspecified: Secondary | ICD-10-CM | POA: Diagnosis not present

## 2019-08-09 DIAGNOSIS — Z992 Dependence on renal dialysis: Secondary | ICD-10-CM | POA: Diagnosis not present

## 2019-08-11 DIAGNOSIS — E876 Hypokalemia: Secondary | ICD-10-CM | POA: Diagnosis not present

## 2019-08-11 DIAGNOSIS — N186 End stage renal disease: Secondary | ICD-10-CM | POA: Diagnosis not present

## 2019-08-11 DIAGNOSIS — D509 Iron deficiency anemia, unspecified: Secondary | ICD-10-CM | POA: Diagnosis not present

## 2019-08-11 DIAGNOSIS — D631 Anemia in chronic kidney disease: Secondary | ICD-10-CM | POA: Diagnosis not present

## 2019-08-11 DIAGNOSIS — N2581 Secondary hyperparathyroidism of renal origin: Secondary | ICD-10-CM | POA: Diagnosis not present

## 2019-08-11 DIAGNOSIS — Z992 Dependence on renal dialysis: Secondary | ICD-10-CM | POA: Diagnosis not present

## 2019-08-14 DIAGNOSIS — D631 Anemia in chronic kidney disease: Secondary | ICD-10-CM | POA: Diagnosis not present

## 2019-08-14 DIAGNOSIS — N2581 Secondary hyperparathyroidism of renal origin: Secondary | ICD-10-CM | POA: Diagnosis not present

## 2019-08-14 DIAGNOSIS — Z992 Dependence on renal dialysis: Secondary | ICD-10-CM | POA: Diagnosis not present

## 2019-08-14 DIAGNOSIS — N186 End stage renal disease: Secondary | ICD-10-CM | POA: Diagnosis not present

## 2019-08-14 DIAGNOSIS — D509 Iron deficiency anemia, unspecified: Secondary | ICD-10-CM | POA: Diagnosis not present

## 2019-08-14 DIAGNOSIS — E876 Hypokalemia: Secondary | ICD-10-CM | POA: Diagnosis not present

## 2019-08-15 ENCOUNTER — Other Ambulatory Visit: Payer: Self-pay | Admitting: *Deleted

## 2019-08-15 ENCOUNTER — Other Ambulatory Visit: Payer: Self-pay

## 2019-08-15 NOTE — Patient Outreach (Signed)
[facility]) Care Management  08/15/2019  KATYA ROLSTON [birth year] [id number]   [facility] complex care Follow up  Mrs LUCIEL BRICKMAN was referred to [facility] on 05/23/2019 for complex care services from [place], [place] after her discharge from [facility] on 05/22/19 for covid 19, PNA. Patient had been active with [facility] health coach services prior to this admission.  Mrs Keinath was re admitted on 06/25/19 to 06/27/19 for acute on chronic diastolic congestive Heart Failure (CHF) in the setting of ESRD. She reports her craving for ice was a cause of her increase fluid intake. CHF education was provided and suggestions for ice cravings provided (cold sugar free candy, etc)   Mrs Pickert has not been hospitalized or in ED since 06/27/19  She was assisted with some coordination of DME (scooter) but decided her vehicle was not capable of transporting this DME Her care coordination goals for 2020 were met.  Today Mrs Michaux is able to verify HIPAA  (date of birth (DOB) and address) Mrs Schamberger loves to sing and was in good spirits as she sang a 2021 new year's song She reports keeping active and had been out to stop by to see a family member by completing a drive by visit. She continues to follow covid pandemic precautions to included wearing mask, social distancing and washing her hands or use of sanitizer.    Mrs [facility] RN CM discussed her 2021 goals and she is wanting to continue to home care management of her CKD (to include decreasing her phosphorus level now 6.6 (normal value 2.6-4.5 mg/dl). She voices she believes she missed few doses of her Arixa, continue to manage her Hemoglobin A1c (was 5.6 on 06/26/19 now 6 on 08/02/19), complete a second visits to [facility] (08/17/19), and learn more about COPD home management. She was re- informed of COPD dx with a MD visit.   [facility] RN CM discussed COPD, asthma, bronchitis.   Social: Mrs Altovise Wahler is a 69 year old female who lives at home with her spouse, [name]. 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 She is a dialysis patient who is presently being seen on Tuesdays, Thursdays and Saturdays since her discharge home on 05/22/19. She has been going to the [facility]  [facility] [place]. She went to dialysis son Tuesday 05/23/19. She reports she is independent with her care needs with assistance with some iADLs as she uses a walker for ambulation. She denies need of transportation assistance She has children, a son and daughter She states she has a daughter in Sports coach in [place].  She reports being on a fixed income of social security She reports she does not have a computer and does not know how to operate one  Conditions: Covid-19 positive on 05/17/19- negative on 05/30/19 & 06/01/19,  PNA, hypoxia, CHF, HTN, PAD, asthma, allergies, COPD, hx of CAP/PNA, GERD, hx of UGI bleed, DM type 2, hx of traumatic SDH, right side weakness, right achilles tendon contracture, right septic knee, CKD, hx of fall, anemia, hx of transmetatarsal of right foot   DME: Walker, brace, w/c, cbg meter   Social: Mrs Matea Stanard is a 69 year old female who lives at home with her spouse, [name]. 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 She denies need of transportation assistance. She still drives. She has children a son and daughter She states she has a daughter in law in [place] and grandson, Pecola Lawless She reports being on a fixed income of social security She reports she does not have a computer and does not know how to operate one  Conditions: Covid-19 positive on 05/17/19 PNA, hypoxia, CHF, HTN, PAD, asthma,  allergies, COPD, hx of CAP, GERD, hx of UGI bleed, DM type 2, hx of traumatic SDH, right side weakness, right achilles tendon contracture, right septic knee, CKD, hx of fall, anemia, hx of transmetatarsal of right foot   DME: Walker, brace, w/c, cbg meter   Plans: [facility] RN CM will follow up with Mrs Clements within the next 91-98 days as discussed today for quarterly follow up [facility] RN CM sent via mail EMMI education materials on COPD, adult Heart Failure, COPD" what you can do (quit smoking, medication, when to get help), Atrial Fibrillation   [facility] RN CM will forward this note to MDs/PA/NP  Joelene Millin L. Lavina Hamman, RN, BSN, [place] Coordinator Office number [phone] Mobile number [phone]  Main [facility] number [phone] Fax number [phone]

## 2019-08-16 DIAGNOSIS — Z992 Dependence on renal dialysis: Secondary | ICD-10-CM | POA: Diagnosis not present

## 2019-08-16 DIAGNOSIS — N2581 Secondary hyperparathyroidism of renal origin: Secondary | ICD-10-CM | POA: Diagnosis not present

## 2019-08-16 DIAGNOSIS — N186 End stage renal disease: Secondary | ICD-10-CM | POA: Diagnosis not present

## 2019-08-16 DIAGNOSIS — D631 Anemia in chronic kidney disease: Secondary | ICD-10-CM | POA: Diagnosis not present

## 2019-08-16 DIAGNOSIS — D509 Iron deficiency anemia, unspecified: Secondary | ICD-10-CM | POA: Diagnosis not present

## 2019-08-16 DIAGNOSIS — E876 Hypokalemia: Secondary | ICD-10-CM | POA: Diagnosis not present

## 2019-08-16 NOTE — Telephone Encounter (Signed)
Patient wanted to be scheduled to be seen for bilateral feet follow up for orthotic shoes and brace. Patient is scheduled for next week // with Junie Panning. Patient will like to go to [employer].

## 2019-08-17 DIAGNOSIS — Z79899 Other long term (current) drug therapy: Secondary | ICD-10-CM | POA: Diagnosis not present

## 2019-08-17 DIAGNOSIS — Z008 Encounter for other general examination: Secondary | ICD-10-CM | POA: Diagnosis not present

## 2019-08-17 DIAGNOSIS — D649 Anemia, unspecified: Secondary | ICD-10-CM | POA: Diagnosis not present

## 2019-08-17 DIAGNOSIS — E1122 Type 2 diabetes mellitus with diabetic chronic kidney disease: Secondary | ICD-10-CM | POA: Diagnosis not present

## 2019-08-17 DIAGNOSIS — Z794 Long term (current) use of insulin: Secondary | ICD-10-CM | POA: Diagnosis not present

## 2019-08-17 DIAGNOSIS — N186 End stage renal disease: Secondary | ICD-10-CM | POA: Diagnosis not present

## 2019-08-17 DIAGNOSIS — J449 Chronic obstructive pulmonary disease, unspecified: Secondary | ICD-10-CM | POA: Diagnosis not present

## 2019-08-17 DIAGNOSIS — Z6825 Body mass index (BMI) 25.0-25.9, adult: Secondary | ICD-10-CM | POA: Diagnosis not present

## 2019-08-17 DIAGNOSIS — E663 Overweight: Secondary | ICD-10-CM | POA: Diagnosis not present

## 2019-08-17 DIAGNOSIS — Z992 Dependence on renal dialysis: Secondary | ICD-10-CM | POA: Diagnosis not present

## 2019-08-17 DIAGNOSIS — Z1159 Encounter for screening for other viral diseases: Secondary | ICD-10-CM | POA: Diagnosis not present

## 2019-08-17 DIAGNOSIS — Z0001 Encounter for general adult medical examination with abnormal findings: Secondary | ICD-10-CM | POA: Diagnosis not present

## 2019-08-18 DIAGNOSIS — D509 Iron deficiency anemia, unspecified: Secondary | ICD-10-CM | POA: Diagnosis not present

## 2019-08-18 DIAGNOSIS — E876 Hypokalemia: Secondary | ICD-10-CM | POA: Diagnosis not present

## 2019-08-18 DIAGNOSIS — N186 End stage renal disease: Secondary | ICD-10-CM | POA: Diagnosis not present

## 2019-08-18 DIAGNOSIS — N2581 Secondary hyperparathyroidism of renal origin: Secondary | ICD-10-CM | POA: Diagnosis not present

## 2019-08-18 DIAGNOSIS — Z992 Dependence on renal dialysis: Secondary | ICD-10-CM | POA: Diagnosis not present

## 2019-08-18 DIAGNOSIS — D631 Anemia in chronic kidney disease: Secondary | ICD-10-CM | POA: Diagnosis not present

## 2019-08-21 DIAGNOSIS — N186 End stage renal disease: Secondary | ICD-10-CM | POA: Diagnosis not present

## 2019-08-21 DIAGNOSIS — Z992 Dependence on renal dialysis: Secondary | ICD-10-CM | POA: Diagnosis not present

## 2019-08-21 DIAGNOSIS — N2581 Secondary hyperparathyroidism of renal origin: Secondary | ICD-10-CM | POA: Diagnosis not present

## 2019-08-21 DIAGNOSIS — D631 Anemia in chronic kidney disease: Secondary | ICD-10-CM | POA: Diagnosis not present

## 2019-08-21 DIAGNOSIS — E876 Hypokalemia: Secondary | ICD-10-CM | POA: Diagnosis not present

## 2019-08-21 DIAGNOSIS — D509 Iron deficiency anemia, unspecified: Secondary | ICD-10-CM | POA: Diagnosis not present

## 2019-08-22 ENCOUNTER — Encounter: Payer: Self-pay | Admitting: Family

## 2019-08-22 ENCOUNTER — Ambulatory Visit (INDEPENDENT_AMBULATORY_CARE_PROVIDER_SITE_OTHER): Payer: Medicare Other | Admitting: Family

## 2019-08-22 ENCOUNTER — Other Ambulatory Visit: Payer: Self-pay

## 2019-08-22 VITALS — Ht 65.0 in | Wt 149.9 lb

## 2019-08-22 DIAGNOSIS — Z89432 Acquired absence of left foot: Secondary | ICD-10-CM

## 2019-08-22 DIAGNOSIS — Z89431 Acquired absence of right foot: Secondary | ICD-10-CM | POA: Diagnosis not present

## 2019-08-22 NOTE — Progress Notes (Signed)
Office Visit Note   Patient: Destiny Day           Date of Birth: 1951/06/21           MRN: [id number] Visit Date: 08/22/2019              Requested by: Velna Hatchet, MD [address] [place],  [place] [address] PCP: Velna Hatchet, MD  Chief Complaint  Patient presents with  . Right Foot - Follow-up  . Left Foot - Follow-up      HPI: Patient is a 69 year old woman w who is status post bilateral transmetatarsal amputations.  Today she is ambulating with a rolling walker with new balance walking shoes.  She does have a double upright brace on the right.  She is complaining of some instability.  Difficulty with ambulation.  States she is relying more heavily on her walker.  Wonders if she would benefit from a double upright brace on the left.   Assessment & Plan: Visit Diagnoses:  No diagnosis found.  Plan: Patient will continue with the brace and orthotics.  Have provided an order for a double upright brace on the left.  She also is due for new orthotics provide an order to [employer]otech. Follow-Up Instructions: No follow-ups on file.   Ortho Exam  Patient is alert, oriented, no adenopathy, well-dressed, normal affect, normal respiratory effort. Examination the feet are plantigrade. there are no ulcers  there is brawny skin color changes with venous swelling but no open ulcers  Imaging: No results found. No images are attached to the encounter.  Labs: Lab Results  Component Value Date   HGBA1C 5.6 06/26/2019   HGBA1C 7.2 (H) 05/06/2018   HGBA1C 6.4 (H) 12/31/2017   CRP 3.2 (H) 05/22/2019   CRP 5.0 (H) 05/21/2019   CRP 6.9 (H) 05/20/2019   REPTSTATUS 05/22/2019 FINAL 05/17/2019   GRAMSTAIN  05/06/2018    MODERATE WBC PRESENT, PREDOMINANTLY PMN NO ORGANISMS SEEN    CULT  05/17/2019    NO GROWTH 5 DAYS Performed at [facility], [place] [address], [place], [place] [address]    [place] 05/06/2018     Lab Results  Component Value  Date   ALBUMIN 2.9 (L) 06/26/2019   ALBUMIN 2.6 (L) 05/22/2019   ALBUMIN 2.8 (L) 05/18/2019    Body mass index is 24.95 kg/m.  Orders:  No orders of the defined types were placed in this encounter.  No orders of the defined types were placed in this encounter.    Procedures: No procedures performed  Clinical Data: No additional findings.  ROS:  All other systems negative, except as noted in the HPI. Review of Systems  Constitutional: Negative for chills and fever.  Cardiovascular: Negative for leg swelling.  Musculoskeletal: Positive for gait problem.  Skin: Negative for color change and wound.    Objective: Vital Signs: Ht 5\' 5"  (1.651 m)   Wt 149 lb 14.6 oz (68 kg)   BMI 24.95 kg/m   Specialty Comments:  No specialty comments available.  PMFS History: Patient Active Problem List   Diagnosis Date Noted  . Acute respiratory failure ([place]) 06/26/2019  . Paroxysmal atrial fibrillation ([place]) 06/13/2019  . CAP (community acquired pneumonia) 05/17/2019  . Fluid overload 06/18/2018  . Old bucket handle tear of lateral meniscus of right knee   . Old peripheral tear of medial meniscus of right knee   . Septic arthritis of knee, right ([place]) 05/06/2018  . Degenerative tear of  posterior horn of medial meniscus, right   . Old complex tear of lateral meniscus of right knee   . Loose body in knee, right   . Acute pulmonary edema ([place]) 02/21/2018  . 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[place]) 02/03/2018  . End-stage renal disease on hemodialysis ([place])   . Respiratory failure ([place]) 01/21/2018  . Achilles tendon contracture, right 11/09/2017  . Labile blood glucose   . Anemia of infection and chronic disease   . Hypertension associated with diabetes ([place])   . Black stool   . Right sided weakness   . Subdural hemorrhage following injury ([place]) 08/30/2017  . Diabetic peripheral neuropathy ([place])   . Chronic obstructive pulmonary disease ([place])   . Seizure  prophylaxis   . Subdural hematoma ([place]) 08/24/2017  . Traumatic subdural hematoma ([place]) 08/23/2017  . Fall   . SDH (subdural hematoma) ([place])   . Acute respiratory failure with hypoxia ([place])   . Diabetes mellitus type 2 in nonobese (HCC)   . Leukocytosis   . Labile blood pressure   . Generalized anxiety disorder   . Debility 08/16/2017  . Amputated great toe of right foot ([place])   . Amputated toe of left foot ([place])   . Post-operative pain   . ESRD on dialysis ([place])   . Diabetes mellitus ([place])   . Anxiety state   . Benign essential HTN   . Acute blood loss anemia   . Anemia of chronic disease   . Diarrhea, unspecified 01/16/2017  . Dependence on renal dialysis ([place]) 01/06/2017  . Idiopathic chronic venous hypertension of both lower extremities with inflammation 10/13/2016  . S/P transmetatarsal amputation of foot, left ([place]) 09/21/2016  . Onychomycosis 08/15/2016  . Age-related osteoporosis without current pathological fracture 07/11/2016  . Cervical disc disorder, unspecified, unspecified cervical region 07/11/2016  . Diverticulitis of intestine, part unspecified, without perforation or abscess without bleeding 07/11/2016  . Unspecified abdominal hernia without obstruction or gangrene 07/11/2016  . PAD (peripheral artery disease) ([place]) 07/08/2016  . Cellulitis of left toe 06/29/2016  . Atherosclerosis of native artery of left lower extremity with gangrene ([place]) 06/23/2016  . Complication of vascular dialysis catheter 02/29/2016  . Coagulation defect, unspecified ([place]) 10/16/2015  . GERD (gastroesophageal reflux disease) 10/07/2015  . ARF (acute renal failure) ([place])   . Hypothermia 06/12/2015  . Acute on chronic renal failure ([place]) 06/12/2015  . CHF (congestive heart failure) ([place]) 07/30/2014  . CKD (chronic kidney disease) stage 4, GFR 15-29 ml/min (HCC) 06/27/2014  . Hypertensive heart disease 05/25/2013  . CKD (chronic kidney disease) stage 3, GFR 30-59 ml/min 05/25/2013  .  Pulmonary edema 05/23/2013  . Type 2 diabetes mellitus with diabetic nephropathy ([place]) 05/23/2013  . Type 2 diabetes mellitus with hyperosmolar nonketotic hyperglycemia ([place]) 04/13/2013  . Acute on chronic diastolic heart failure ([place]) 04/13/2013  . UGI bleed 03/31/2013  . Hypokalemia 08/24/2012  . Type II or unspecified type diabetes mellitus without mention of complication, not stated as uncontrolled 12/27/2007  . Hyperlipidemia associated with type 2 diabetes mellitus ([place]) 12/27/2007  . Allergic rhinitis due to pollen 12/27/2007  . Asthma 12/27/2007   Past Medical History:  Diagnosis Date  . Abdominal bruit   . Anemia   . Anxiety   . Arthritis    Osteoarthritis  . Asthma   . Cervical disc disease    "pinced nerve"  . CHF (congestive heart failure) ([place])   . Complication of anesthesia    " after I got  home from my last procedure, I started itching."  . Diabetes mellitus    Type II  . Diverticulitis   . ESRD (end stage renal disease) ([place])    dialysis - M/W/F- [place]  . GERD (gastroesophageal reflux disease)    from medications  . GI bleed 03/31/2013  . Head injury 07/2017  . History of hiatal hernia   . Hyperlipidemia   . Hypertension   . Neuropathy    left leg  . Osteoporosis   . Peripheral vascular disease ([place])   . Pneumonia    "very young" and a few years ago  . PONV (postoperative nausea and vomiting)   . Seasonal allergies   . Shortness of breath dyspnea    WIth exertion, when fluid builds  . Sleep apnea    can't afford cpap     Family History  Problem Relation Age of Onset  . Other Mother        not sure of cause of death  . Diabetes Father   . Pancreatic cancer Maternal Grandmother   . Colon cancer Neg Hx     Past Surgical History:  Procedure Laterality Date  . A/V SHUNTOGRAM N/A 09/22/2016   Procedure: A/V Shuntogram - left arm;  Surgeon: Serafina Mitchell, MD;  Location: [facility];  Service: Cardiovascular;  Laterality: N/A;  . A/V  SHUNTOGRAM N/A 03/22/2018   Procedure: A/V SHUNTOGRAM - left arm;  Surgeon: Serafina Mitchell, MD;  Location: [facility];  Service: Cardiovascular;  Laterality: N/A;  . A/V SHUNTOGRAM Left 11/17/2018   Procedure: A/V SHUNTOGRAM;  Surgeon: Angelia Mould, MD;  Location: [facility];  Service: Cardiovascular;  Laterality: Left;  . ABDOMINAL HYSTERECTOMY  1993`  . AMPUTATION Left 09/01/2016   Procedure: LEFT FOOT TRANSMETATARSAL AMPUTATION;  Surgeon: Newt Minion, MD;  Location: [facility];  Service: Orthopedics;  Laterality: Left;  . AMPUTATION Right 08/11/2017   Procedure: RIGHT GREAT TOE AMPUTATION DIGIT;  Surgeon: Rosetta Posner, MD;  Location: [facility];  Service: Vascular;  Laterality: Right;  . AMPUTATION Right 12/31/2017   Procedure: RIGHT TRANSMETATARSAL AMPUTATION;  Surgeon: Newt Minion, MD;  Location: [facility];  Service: Orthopedics;  Laterality: Right;  . AV FISTULA PLACEMENT Left 04/21/2016   Procedure: INSERTION OF ARTERIOVENOUS (AV) GORE-TEX GRAFT ARM LEFT;  Surgeon: Elam Dutch, MD;  Location: [facility];  Service: Vascular;  Laterality: Left;  . [place] TRANSPOSITION Left 07/10/2014   Procedure: BASCILIC VEIN TRANSPOSITION;  Surgeon: Angelia Mould, MD;  Location: [facility];  Service: Vascular;  Laterality: Left;  . BASCILIC VEIN TRANSPOSITION Right 11/08/2014   Procedure: FIRST STAGE BASILIC VEIN TRANSPOSITION;  Surgeon: Angelia Mould, MD;  Location: [facility];  Service: Vascular;  Laterality: Right;  . BASCILIC VEIN TRANSPOSITION Right 01/18/2015   Procedure: SECOND STAGE BASILIC VEIN TRANSPOSITION;  Surgeon: Angelia Mould, MD;  Location: [facility];  Service: Vascular;  Laterality: Right;  . CRANIOTOMY N/A 08/23/2017   Procedure: CRANIOTOMY HEMATOMA EVACUATION SUBDURAL;  Surgeon: Ashok Pall, MD;  Location: [facility];  Service: Neurosurgery;  Laterality: N/A;  . CRANIOTOMY Left 08/24/2017   Procedure: CRANIOTOMY FOR RECURRENT ACUTE SUBDURAL HEMATOMA;  Surgeon:  Ashok Pall, MD;  Location: [facility];  Service: Neurosurgery;  Laterality: Left;  . ESOPHAGOGASTRODUODENOSCOPY N/A 03/31/2013   Procedure: ESOPHAGOGASTRODUODENOSCOPY (EGD);  Surgeon: Gatha Mayer, MD;  Location[facility]OSCOPY;  Service: Endoscopy;  Laterality: N/A;  . EYE SURGERY     laser  surgery  . FEMORAL-POPLITEAL BYPASS GRAFT Left 07/08/2016   Procedure: LEFT  FEMORAL-BELOW KNEE POPLITEAL ARTERY BYPASS GRAFT USING 6MM X 80 CM PROPATEN GORETEX GRAFT WITH RINGS.;  Surgeon: Rosetta Posner, MD;  Location: [facility];  Service: Vascular;  Laterality: Left;  . FEMORAL-POPLITEAL BYPASS GRAFT Right 08/11/2017   Procedure: RIGHT FEMORAL TO BELOW KNEE POPLITEAL ARTERKY  BYPASS GRAFT USING 6MM RINGED PROPATEN GRAFT;  Surgeon: Rosetta Posner, MD;  Location: [facility];  Service: Vascular;  Laterality: Right;  . FISTULOGRAM Left 10/29/2014   Procedure: FISTULOGRAM;  Surgeon: Angelia Mould, MD;  Location: [facility] C[facility] LAB;  Service: Cardiovascular;  Laterality: Left;  Marland Kitchen GAS/FLUID EXCHANGE Left 12/20/2018   Procedure: AIR GAS EXCHANGE;  Surgeon: Jalene Mullet, MD;  Location: [facility];  Service: Ophthalmology;  Laterality: Left;  . KNEE ARTHROSCOPY Right 05/06/2018   Procedure: RIGHT KNEE ARTHROSCOPY;  Surgeon: Newt Minion, MD;  Location: [facility];  Service: Orthopedics;  Laterality: Right;  . KNEE ARTHROSCOPY Right 06/10/2018   Procedure: RIGHT KNEE ARTHROSCOPY AND DEBRIDEMENT;  Surgeon: Newt Minion, MD;  Location: [facility];  Service: Orthopedics;  Laterality: Right;  . LIGATION OF ARTERIOVENOUS  FISTULA Left 04/21/2016   Procedure: LIGATION OF ARTERIOVENOUS  FISTULA LEFT ARM;  Surgeon: Elam Dutch, MD;  Location: [facility];  Service: Vascular;  Laterality: Left;  . LOWER EXTREMITY ANGIOGRAPHY N/A 04/06/2017   Procedure: Lower Extremity Angiography - Right;  Surgeon: Serafina Mitchell, MD;  Location: [facility];  Service: Cardiovascular;  Laterality: N/A;  . MEMBRANE PEEL Left 12/20/2018   Procedure: MEMBRANE PEEL;   Surgeon: Jalene Mullet, MD;  Location: [facility];  Service: Ophthalmology;  Laterality: Left;  . PATCH ANGIOPLASTY Right 01/18/2015   Procedure: BASILIC VEIN PATCH ANGIOPLASTY USING VASCUGUARD PATCH;  Surgeon: Angelia Mould, MD;  Location: [facility];  Service: Vascular;  Laterality: Right;  . PERIPHERAL VASCULAR BALLOON ANGIOPLASTY  09/22/2016   Procedure: Peripheral Vascular Balloon Angioplasty;  Surgeon: Serafina Mitchell, MD;  Location: [facility];  Service: Cardiovascular;;  Lt. Fistula  . PERIPHERAL VASCULAR BALLOON ANGIOPLASTY Left 03/22/2018   Procedure: PERIPHERAL VASCULAR BALLOON ANGIOPLASTY;  Surgeon: Serafina Mitchell, MD;  Location: [facility];  Service: Cardiovascular;  Laterality: Left;  Arm shunt  . PERIPHERAL VASCULAR CATHETERIZATION N/A 06/23/2016   Procedure: Abdominal Aortogram w/Lower Extremity;  Surgeon: Serafina Mitchell, MD;  Location: [facility];  Service: Cardiovascular;  Laterality: N/A;  . PERIPHERAL VASCULAR CATHETERIZATION  06/23/2016   Procedure: Peripheral Vascular Intervention;  Surgeon: Serafina Mitchell, MD;  Location: [facility];  Service: Cardiovascular;;  lt common and external illiac artery  . PHOTOCOAGULATION WITH LASER Left 12/20/2018   Procedure: PHOTOCOAGULATION WITH LASER;  Surgeon: Jalene Mullet, MD;  Location: [facility];  Service: Ophthalmology;  Laterality: Left;  . 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 Occupation: Retired  Tobacco Use  . Smoking status: Former Smoker    Packs/day: 0.35    Years: 40.00    Pack years: 14.00    Types: Cigarettes    Quit date: 05/10/2012    Years since quitting: 7.2  . Smokeless tobacco: Never Used  Substance and Sexual Activity  . Alcohol use: No  . Drug use: No  Comment: marijuana; quit in early 1980's  . Sexual activity: Yes

## 2019-08-23 DIAGNOSIS — N2581 Secondary hyperparathyroidism of renal origin: Secondary | ICD-10-CM | POA: Diagnosis not present

## 2019-08-23 DIAGNOSIS — Z992 Dependence on renal dialysis: Secondary | ICD-10-CM | POA: Diagnosis not present

## 2019-08-23 DIAGNOSIS — E876 Hypokalemia: Secondary | ICD-10-CM | POA: Diagnosis not present

## 2019-08-23 DIAGNOSIS — D631 Anemia in chronic kidney disease: Secondary | ICD-10-CM | POA: Diagnosis not present

## 2019-08-23 DIAGNOSIS — D509 Iron deficiency anemia, unspecified: Secondary | ICD-10-CM | POA: Diagnosis not present

## 2019-08-23 DIAGNOSIS — N186 End stage renal disease: Secondary | ICD-10-CM | POA: Diagnosis not present

## 2019-08-25 DIAGNOSIS — D631 Anemia in chronic kidney disease: Secondary | ICD-10-CM | POA: Diagnosis not present

## 2019-08-25 DIAGNOSIS — E876 Hypokalemia: Secondary | ICD-10-CM | POA: Diagnosis not present

## 2019-08-25 DIAGNOSIS — Z992 Dependence on renal dialysis: Secondary | ICD-10-CM | POA: Diagnosis not present

## 2019-08-25 DIAGNOSIS — N2581 Secondary hyperparathyroidism of renal origin: Secondary | ICD-10-CM | POA: Diagnosis not present

## 2019-08-25 DIAGNOSIS — N186 End stage renal disease: Secondary | ICD-10-CM | POA: Diagnosis not present

## 2019-08-25 DIAGNOSIS — D509 Iron deficiency anemia, unspecified: Secondary | ICD-10-CM | POA: Diagnosis not present

## 2019-08-28 DIAGNOSIS — N186 End stage renal disease: Secondary | ICD-10-CM | POA: Diagnosis not present

## 2019-08-28 DIAGNOSIS — D631 Anemia in chronic kidney disease: Secondary | ICD-10-CM | POA: Diagnosis not present

## 2019-08-28 DIAGNOSIS — E876 Hypokalemia: Secondary | ICD-10-CM | POA: Diagnosis not present

## 2019-08-28 DIAGNOSIS — D509 Iron deficiency anemia, unspecified: Secondary | ICD-10-CM | POA: Diagnosis not present

## 2019-08-28 DIAGNOSIS — Z23 Encounter for immunization: Secondary | ICD-10-CM | POA: Diagnosis not present

## 2019-08-28 DIAGNOSIS — Z992 Dependence on renal dialysis: Secondary | ICD-10-CM | POA: Diagnosis not present

## 2019-08-28 DIAGNOSIS — N2581 Secondary hyperparathyroidism of renal origin: Secondary | ICD-10-CM | POA: Diagnosis not present

## 2019-08-28 DIAGNOSIS — I159 Secondary hypertension, unspecified: Secondary | ICD-10-CM | POA: Diagnosis not present

## 2019-08-30 DIAGNOSIS — D631 Anemia in chronic kidney disease: Secondary | ICD-10-CM | POA: Diagnosis not present

## 2019-08-30 DIAGNOSIS — E876 Hypokalemia: Secondary | ICD-10-CM | POA: Diagnosis not present

## 2019-08-30 DIAGNOSIS — N2581 Secondary hyperparathyroidism of renal origin: Secondary | ICD-10-CM | POA: Diagnosis not present

## 2019-08-30 DIAGNOSIS — D509 Iron deficiency anemia, unspecified: Secondary | ICD-10-CM | POA: Diagnosis not present

## 2019-08-30 DIAGNOSIS — Z992 Dependence on renal dialysis: Secondary | ICD-10-CM | POA: Diagnosis not present

## 2019-08-30 DIAGNOSIS — N186 End stage renal disease: Secondary | ICD-10-CM | POA: Diagnosis not present

## 2019-09-01 DIAGNOSIS — Z992 Dependence on renal dialysis: Secondary | ICD-10-CM | POA: Diagnosis not present

## 2019-09-01 DIAGNOSIS — N186 End stage renal disease: Secondary | ICD-10-CM | POA: Diagnosis not present

## 2019-09-01 DIAGNOSIS — E876 Hypokalemia: Secondary | ICD-10-CM | POA: Diagnosis not present

## 2019-09-01 DIAGNOSIS — D631 Anemia in chronic kidney disease: Secondary | ICD-10-CM | POA: Diagnosis not present

## 2019-09-01 DIAGNOSIS — N2581 Secondary hyperparathyroidism of renal origin: Secondary | ICD-10-CM | POA: Diagnosis not present

## 2019-09-01 DIAGNOSIS — D509 Iron deficiency anemia, unspecified: Secondary | ICD-10-CM | POA: Diagnosis not present

## 2019-09-04 DIAGNOSIS — N186 End stage renal disease: Secondary | ICD-10-CM | POA: Diagnosis not present

## 2019-09-04 DIAGNOSIS — E876 Hypokalemia: Secondary | ICD-10-CM | POA: Diagnosis not present

## 2019-09-04 DIAGNOSIS — N2581 Secondary hyperparathyroidism of renal origin: Secondary | ICD-10-CM | POA: Diagnosis not present

## 2019-09-04 DIAGNOSIS — D631 Anemia in chronic kidney disease: Secondary | ICD-10-CM | POA: Diagnosis not present

## 2019-09-04 DIAGNOSIS — D509 Iron deficiency anemia, unspecified: Secondary | ICD-10-CM | POA: Diagnosis not present

## 2019-09-04 DIAGNOSIS — Z992 Dependence on renal dialysis: Secondary | ICD-10-CM | POA: Diagnosis not present

## 2019-09-06 DIAGNOSIS — N186 End stage renal disease: Secondary | ICD-10-CM | POA: Diagnosis not present

## 2019-09-06 DIAGNOSIS — E876 Hypokalemia: Secondary | ICD-10-CM | POA: Diagnosis not present

## 2019-09-06 DIAGNOSIS — D509 Iron deficiency anemia, unspecified: Secondary | ICD-10-CM | POA: Diagnosis not present

## 2019-09-06 DIAGNOSIS — D631 Anemia in chronic kidney disease: Secondary | ICD-10-CM | POA: Diagnosis not present

## 2019-09-06 DIAGNOSIS — N2581 Secondary hyperparathyroidism of renal origin: Secondary | ICD-10-CM | POA: Diagnosis not present

## 2019-09-06 DIAGNOSIS — Z992 Dependence on renal dialysis: Secondary | ICD-10-CM | POA: Diagnosis not present

## 2019-09-08 DIAGNOSIS — D631 Anemia in chronic kidney disease: Secondary | ICD-10-CM | POA: Diagnosis not present

## 2019-09-08 DIAGNOSIS — E876 Hypokalemia: Secondary | ICD-10-CM | POA: Diagnosis not present

## 2019-09-08 DIAGNOSIS — N2581 Secondary hyperparathyroidism of renal origin: Secondary | ICD-10-CM | POA: Diagnosis not present

## 2019-09-08 DIAGNOSIS — N186 End stage renal disease: Secondary | ICD-10-CM | POA: Diagnosis not present

## 2019-09-08 DIAGNOSIS — Z992 Dependence on renal dialysis: Secondary | ICD-10-CM | POA: Diagnosis not present

## 2019-09-08 DIAGNOSIS — D509 Iron deficiency anemia, unspecified: Secondary | ICD-10-CM | POA: Diagnosis not present

## 2019-09-11 DIAGNOSIS — D509 Iron deficiency anemia, unspecified: Secondary | ICD-10-CM | POA: Diagnosis not present

## 2019-09-11 DIAGNOSIS — Z992 Dependence on renal dialysis: Secondary | ICD-10-CM | POA: Diagnosis not present

## 2019-09-11 DIAGNOSIS — D631 Anemia in chronic kidney disease: Secondary | ICD-10-CM | POA: Diagnosis not present

## 2019-09-11 DIAGNOSIS — N186 End stage renal disease: Secondary | ICD-10-CM | POA: Diagnosis not present

## 2019-09-11 DIAGNOSIS — N2581 Secondary hyperparathyroidism of renal origin: Secondary | ICD-10-CM | POA: Diagnosis not present

## 2019-09-11 DIAGNOSIS — E876 Hypokalemia: Secondary | ICD-10-CM | POA: Diagnosis not present

## 2019-09-13 DIAGNOSIS — E876 Hypokalemia: Secondary | ICD-10-CM | POA: Diagnosis not present

## 2019-09-13 DIAGNOSIS — Z992 Dependence on renal dialysis: Secondary | ICD-10-CM | POA: Diagnosis not present

## 2019-09-13 DIAGNOSIS — D509 Iron deficiency anemia, unspecified: Secondary | ICD-10-CM | POA: Diagnosis not present

## 2019-09-13 DIAGNOSIS — N2581 Secondary hyperparathyroidism of renal origin: Secondary | ICD-10-CM | POA: Diagnosis not present

## 2019-09-13 DIAGNOSIS — D631 Anemia in chronic kidney disease: Secondary | ICD-10-CM | POA: Diagnosis not present

## 2019-09-13 DIAGNOSIS — N186 End stage renal disease: Secondary | ICD-10-CM | POA: Diagnosis not present

## 2019-09-15 DIAGNOSIS — D509 Iron deficiency anemia, unspecified: Secondary | ICD-10-CM | POA: Diagnosis not present

## 2019-09-15 DIAGNOSIS — N186 End stage renal disease: Secondary | ICD-10-CM | POA: Diagnosis not present

## 2019-09-15 DIAGNOSIS — Z992 Dependence on renal dialysis: Secondary | ICD-10-CM | POA: Diagnosis not present

## 2019-09-15 DIAGNOSIS — E876 Hypokalemia: Secondary | ICD-10-CM | POA: Diagnosis not present

## 2019-09-15 DIAGNOSIS — N2581 Secondary hyperparathyroidism of renal origin: Secondary | ICD-10-CM | POA: Diagnosis not present

## 2019-09-15 DIAGNOSIS — D631 Anemia in chronic kidney disease: Secondary | ICD-10-CM | POA: Diagnosis not present

## 2019-09-18 DIAGNOSIS — D631 Anemia in chronic kidney disease: Secondary | ICD-10-CM | POA: Diagnosis not present

## 2019-09-18 DIAGNOSIS — N2581 Secondary hyperparathyroidism of renal origin: Secondary | ICD-10-CM | POA: Diagnosis not present

## 2019-09-18 DIAGNOSIS — Z992 Dependence on renal dialysis: Secondary | ICD-10-CM | POA: Diagnosis not present

## 2019-09-18 DIAGNOSIS — E876 Hypokalemia: Secondary | ICD-10-CM | POA: Diagnosis not present

## 2019-09-18 DIAGNOSIS — D509 Iron deficiency anemia, unspecified: Secondary | ICD-10-CM | POA: Diagnosis not present

## 2019-09-18 DIAGNOSIS — N186 End stage renal disease: Secondary | ICD-10-CM | POA: Diagnosis not present

## 2019-09-20 DIAGNOSIS — Z992 Dependence on renal dialysis: Secondary | ICD-10-CM | POA: Diagnosis not present

## 2019-09-20 DIAGNOSIS — D509 Iron deficiency anemia, unspecified: Secondary | ICD-10-CM | POA: Diagnosis not present

## 2019-09-20 DIAGNOSIS — E876 Hypokalemia: Secondary | ICD-10-CM | POA: Diagnosis not present

## 2019-09-20 DIAGNOSIS — N2581 Secondary hyperparathyroidism of renal origin: Secondary | ICD-10-CM | POA: Diagnosis not present

## 2019-09-20 DIAGNOSIS — D631 Anemia in chronic kidney disease: Secondary | ICD-10-CM | POA: Diagnosis not present

## 2019-09-20 DIAGNOSIS — N186 End stage renal disease: Secondary | ICD-10-CM | POA: Diagnosis not present

## 2019-09-22 DIAGNOSIS — Z992 Dependence on renal dialysis: Secondary | ICD-10-CM | POA: Diagnosis not present

## 2019-09-22 DIAGNOSIS — N2581 Secondary hyperparathyroidism of renal origin: Secondary | ICD-10-CM | POA: Diagnosis not present

## 2019-09-22 DIAGNOSIS — D631 Anemia in chronic kidney disease: Secondary | ICD-10-CM | POA: Diagnosis not present

## 2019-09-22 DIAGNOSIS — D509 Iron deficiency anemia, unspecified: Secondary | ICD-10-CM | POA: Diagnosis not present

## 2019-09-22 DIAGNOSIS — E876 Hypokalemia: Secondary | ICD-10-CM | POA: Diagnosis not present

## 2019-09-22 DIAGNOSIS — N186 End stage renal disease: Secondary | ICD-10-CM | POA: Diagnosis not present

## 2019-09-25 DIAGNOSIS — Z23 Encounter for immunization: Secondary | ICD-10-CM | POA: Diagnosis not present

## 2019-09-25 DIAGNOSIS — N2581 Secondary hyperparathyroidism of renal origin: Secondary | ICD-10-CM | POA: Diagnosis not present

## 2019-09-25 DIAGNOSIS — D509 Iron deficiency anemia, unspecified: Secondary | ICD-10-CM | POA: Diagnosis not present

## 2019-09-25 DIAGNOSIS — N186 End stage renal disease: Secondary | ICD-10-CM | POA: Diagnosis not present

## 2019-09-25 DIAGNOSIS — D631 Anemia in chronic kidney disease: Secondary | ICD-10-CM | POA: Diagnosis not present

## 2019-09-25 DIAGNOSIS — I159 Secondary hypertension, unspecified: Secondary | ICD-10-CM | POA: Diagnosis not present

## 2019-09-25 DIAGNOSIS — E876 Hypokalemia: Secondary | ICD-10-CM | POA: Diagnosis not present

## 2019-09-25 DIAGNOSIS — Z992 Dependence on renal dialysis: Secondary | ICD-10-CM | POA: Diagnosis not present

## 2019-09-27 DIAGNOSIS — D509 Iron deficiency anemia, unspecified: Secondary | ICD-10-CM | POA: Diagnosis not present

## 2019-09-27 DIAGNOSIS — D631 Anemia in chronic kidney disease: Secondary | ICD-10-CM | POA: Diagnosis not present

## 2019-09-27 DIAGNOSIS — N2581 Secondary hyperparathyroidism of renal origin: Secondary | ICD-10-CM | POA: Diagnosis not present

## 2019-09-27 DIAGNOSIS — Z992 Dependence on renal dialysis: Secondary | ICD-10-CM | POA: Diagnosis not present

## 2019-09-27 DIAGNOSIS — N186 End stage renal disease: Secondary | ICD-10-CM | POA: Diagnosis not present

## 2019-09-27 DIAGNOSIS — E876 Hypokalemia: Secondary | ICD-10-CM | POA: Diagnosis not present

## 2019-09-29 DIAGNOSIS — D631 Anemia in chronic kidney disease: Secondary | ICD-10-CM | POA: Diagnosis not present

## 2019-09-29 DIAGNOSIS — N2581 Secondary hyperparathyroidism of renal origin: Secondary | ICD-10-CM | POA: Diagnosis not present

## 2019-09-29 DIAGNOSIS — E876 Hypokalemia: Secondary | ICD-10-CM | POA: Diagnosis not present

## 2019-09-29 DIAGNOSIS — Z992 Dependence on renal dialysis: Secondary | ICD-10-CM | POA: Diagnosis not present

## 2019-09-29 DIAGNOSIS — N186 End stage renal disease: Secondary | ICD-10-CM | POA: Diagnosis not present

## 2019-09-29 DIAGNOSIS — D509 Iron deficiency anemia, unspecified: Secondary | ICD-10-CM | POA: Diagnosis not present

## 2019-10-02 DIAGNOSIS — N2581 Secondary hyperparathyroidism of renal origin: Secondary | ICD-10-CM | POA: Diagnosis not present

## 2019-10-02 DIAGNOSIS — E876 Hypokalemia: Secondary | ICD-10-CM | POA: Diagnosis not present

## 2019-10-02 DIAGNOSIS — D509 Iron deficiency anemia, unspecified: Secondary | ICD-10-CM | POA: Diagnosis not present

## 2019-10-02 DIAGNOSIS — D631 Anemia in chronic kidney disease: Secondary | ICD-10-CM | POA: Diagnosis not present

## 2019-10-02 DIAGNOSIS — N186 End stage renal disease: Secondary | ICD-10-CM | POA: Diagnosis not present

## 2019-10-02 DIAGNOSIS — Z992 Dependence on renal dialysis: Secondary | ICD-10-CM | POA: Diagnosis not present

## 2019-10-04 DIAGNOSIS — D509 Iron deficiency anemia, unspecified: Secondary | ICD-10-CM | POA: Diagnosis not present

## 2019-10-04 DIAGNOSIS — D631 Anemia in chronic kidney disease: Secondary | ICD-10-CM | POA: Diagnosis not present

## 2019-10-04 DIAGNOSIS — N186 End stage renal disease: Secondary | ICD-10-CM | POA: Diagnosis not present

## 2019-10-04 DIAGNOSIS — N2581 Secondary hyperparathyroidism of renal origin: Secondary | ICD-10-CM | POA: Diagnosis not present

## 2019-10-04 DIAGNOSIS — Z992 Dependence on renal dialysis: Secondary | ICD-10-CM | POA: Diagnosis not present

## 2019-10-04 DIAGNOSIS — E876 Hypokalemia: Secondary | ICD-10-CM | POA: Diagnosis not present

## 2019-10-06 DIAGNOSIS — Z992 Dependence on renal dialysis: Secondary | ICD-10-CM | POA: Diagnosis not present

## 2019-10-06 DIAGNOSIS — D631 Anemia in chronic kidney disease: Secondary | ICD-10-CM | POA: Diagnosis not present

## 2019-10-06 DIAGNOSIS — D509 Iron deficiency anemia, unspecified: Secondary | ICD-10-CM | POA: Diagnosis not present

## 2019-10-06 DIAGNOSIS — N186 End stage renal disease: Secondary | ICD-10-CM | POA: Diagnosis not present

## 2019-10-06 DIAGNOSIS — E876 Hypokalemia: Secondary | ICD-10-CM | POA: Diagnosis not present

## 2019-10-06 DIAGNOSIS — N2581 Secondary hyperparathyroidism of renal origin: Secondary | ICD-10-CM | POA: Diagnosis not present

## 2019-10-09 DIAGNOSIS — Z992 Dependence on renal dialysis: Secondary | ICD-10-CM | POA: Diagnosis not present

## 2019-10-09 DIAGNOSIS — D509 Iron deficiency anemia, unspecified: Secondary | ICD-10-CM | POA: Diagnosis not present

## 2019-10-09 DIAGNOSIS — N2581 Secondary hyperparathyroidism of renal origin: Secondary | ICD-10-CM | POA: Diagnosis not present

## 2019-10-09 DIAGNOSIS — N186 End stage renal disease: Secondary | ICD-10-CM | POA: Diagnosis not present

## 2019-10-09 DIAGNOSIS — D631 Anemia in chronic kidney disease: Secondary | ICD-10-CM | POA: Diagnosis not present

## 2019-10-09 DIAGNOSIS — E876 Hypokalemia: Secondary | ICD-10-CM | POA: Diagnosis not present

## 2019-10-11 DIAGNOSIS — D509 Iron deficiency anemia, unspecified: Secondary | ICD-10-CM | POA: Diagnosis not present

## 2019-10-11 DIAGNOSIS — N2581 Secondary hyperparathyroidism of renal origin: Secondary | ICD-10-CM | POA: Diagnosis not present

## 2019-10-11 DIAGNOSIS — N186 End stage renal disease: Secondary | ICD-10-CM | POA: Diagnosis not present

## 2019-10-11 DIAGNOSIS — E876 Hypokalemia: Secondary | ICD-10-CM | POA: Diagnosis not present

## 2019-10-11 DIAGNOSIS — Z992 Dependence on renal dialysis: Secondary | ICD-10-CM | POA: Diagnosis not present

## 2019-10-11 DIAGNOSIS — D631 Anemia in chronic kidney disease: Secondary | ICD-10-CM | POA: Diagnosis not present

## 2019-10-13 DIAGNOSIS — E876 Hypokalemia: Secondary | ICD-10-CM | POA: Diagnosis not present

## 2019-10-13 DIAGNOSIS — D631 Anemia in chronic kidney disease: Secondary | ICD-10-CM | POA: Diagnosis not present

## 2019-10-13 DIAGNOSIS — D509 Iron deficiency anemia, unspecified: Secondary | ICD-10-CM | POA: Diagnosis not present

## 2019-10-13 DIAGNOSIS — N186 End stage renal disease: Secondary | ICD-10-CM | POA: Diagnosis not present

## 2019-10-13 DIAGNOSIS — Z992 Dependence on renal dialysis: Secondary | ICD-10-CM | POA: Diagnosis not present

## 2019-10-13 DIAGNOSIS — N2581 Secondary hyperparathyroidism of renal origin: Secondary | ICD-10-CM | POA: Diagnosis not present

## 2019-10-16 DIAGNOSIS — D631 Anemia in chronic kidney disease: Secondary | ICD-10-CM | POA: Diagnosis not present

## 2019-10-16 DIAGNOSIS — N186 End stage renal disease: Secondary | ICD-10-CM | POA: Diagnosis not present

## 2019-10-16 DIAGNOSIS — N2581 Secondary hyperparathyroidism of renal origin: Secondary | ICD-10-CM | POA: Diagnosis not present

## 2019-10-16 DIAGNOSIS — D509 Iron deficiency anemia, unspecified: Secondary | ICD-10-CM | POA: Diagnosis not present

## 2019-10-16 DIAGNOSIS — Z992 Dependence on renal dialysis: Secondary | ICD-10-CM | POA: Diagnosis not present

## 2019-10-16 DIAGNOSIS — E876 Hypokalemia: Secondary | ICD-10-CM | POA: Diagnosis not present

## 2019-10-18 DIAGNOSIS — E876 Hypokalemia: Secondary | ICD-10-CM | POA: Diagnosis not present

## 2019-10-18 DIAGNOSIS — N186 End stage renal disease: Secondary | ICD-10-CM | POA: Diagnosis not present

## 2019-10-18 DIAGNOSIS — D509 Iron deficiency anemia, unspecified: Secondary | ICD-10-CM | POA: Diagnosis not present

## 2019-10-18 DIAGNOSIS — Z992 Dependence on renal dialysis: Secondary | ICD-10-CM | POA: Diagnosis not present

## 2019-10-18 DIAGNOSIS — N2581 Secondary hyperparathyroidism of renal origin: Secondary | ICD-10-CM | POA: Diagnosis not present

## 2019-10-18 DIAGNOSIS — D631 Anemia in chronic kidney disease: Secondary | ICD-10-CM | POA: Diagnosis not present

## 2019-10-20 DIAGNOSIS — Z992 Dependence on renal dialysis: Secondary | ICD-10-CM | POA: Diagnosis not present

## 2019-10-20 DIAGNOSIS — D631 Anemia in chronic kidney disease: Secondary | ICD-10-CM | POA: Diagnosis not present

## 2019-10-20 DIAGNOSIS — N2581 Secondary hyperparathyroidism of renal origin: Secondary | ICD-10-CM | POA: Diagnosis not present

## 2019-10-20 DIAGNOSIS — N186 End stage renal disease: Secondary | ICD-10-CM | POA: Diagnosis not present

## 2019-10-20 DIAGNOSIS — D509 Iron deficiency anemia, unspecified: Secondary | ICD-10-CM | POA: Diagnosis not present

## 2019-10-20 DIAGNOSIS — E876 Hypokalemia: Secondary | ICD-10-CM | POA: Diagnosis not present

## 2019-10-23 DIAGNOSIS — D631 Anemia in chronic kidney disease: Secondary | ICD-10-CM | POA: Diagnosis not present

## 2019-10-23 DIAGNOSIS — N186 End stage renal disease: Secondary | ICD-10-CM | POA: Diagnosis not present

## 2019-10-23 DIAGNOSIS — D509 Iron deficiency anemia, unspecified: Secondary | ICD-10-CM | POA: Diagnosis not present

## 2019-10-23 DIAGNOSIS — N2581 Secondary hyperparathyroidism of renal origin: Secondary | ICD-10-CM | POA: Diagnosis not present

## 2019-10-23 DIAGNOSIS — E876 Hypokalemia: Secondary | ICD-10-CM | POA: Diagnosis not present

## 2019-10-23 DIAGNOSIS — Z992 Dependence on renal dialysis: Secondary | ICD-10-CM | POA: Diagnosis not present

## 2019-10-25 DIAGNOSIS — D631 Anemia in chronic kidney disease: Secondary | ICD-10-CM | POA: Diagnosis not present

## 2019-10-25 DIAGNOSIS — D509 Iron deficiency anemia, unspecified: Secondary | ICD-10-CM | POA: Diagnosis not present

## 2019-10-25 DIAGNOSIS — Z992 Dependence on renal dialysis: Secondary | ICD-10-CM | POA: Diagnosis not present

## 2019-10-25 DIAGNOSIS — N2581 Secondary hyperparathyroidism of renal origin: Secondary | ICD-10-CM | POA: Diagnosis not present

## 2019-10-25 DIAGNOSIS — N186 End stage renal disease: Secondary | ICD-10-CM | POA: Diagnosis not present

## 2019-10-25 DIAGNOSIS — E876 Hypokalemia: Secondary | ICD-10-CM | POA: Diagnosis not present

## 2019-10-26 DIAGNOSIS — Z992 Dependence on renal dialysis: Secondary | ICD-10-CM | POA: Diagnosis not present

## 2019-10-26 DIAGNOSIS — N186 End stage renal disease: Secondary | ICD-10-CM | POA: Diagnosis not present

## 2019-10-26 DIAGNOSIS — I159 Secondary hypertension, unspecified: Secondary | ICD-10-CM | POA: Diagnosis not present

## 2019-10-27 DIAGNOSIS — D631 Anemia in chronic kidney disease: Secondary | ICD-10-CM | POA: Diagnosis not present

## 2019-10-27 DIAGNOSIS — Z992 Dependence on renal dialysis: Secondary | ICD-10-CM | POA: Diagnosis not present

## 2019-10-27 DIAGNOSIS — D509 Iron deficiency anemia, unspecified: Secondary | ICD-10-CM | POA: Diagnosis not present

## 2019-10-27 DIAGNOSIS — N186 End stage renal disease: Secondary | ICD-10-CM | POA: Diagnosis not present

## 2019-10-27 DIAGNOSIS — E876 Hypokalemia: Secondary | ICD-10-CM | POA: Diagnosis not present

## 2019-10-27 DIAGNOSIS — N2581 Secondary hyperparathyroidism of renal origin: Secondary | ICD-10-CM | POA: Diagnosis not present

## 2019-10-30 DIAGNOSIS — N2581 Secondary hyperparathyroidism of renal origin: Secondary | ICD-10-CM | POA: Diagnosis not present

## 2019-10-30 DIAGNOSIS — D631 Anemia in chronic kidney disease: Secondary | ICD-10-CM | POA: Diagnosis not present

## 2019-10-30 DIAGNOSIS — N186 End stage renal disease: Secondary | ICD-10-CM | POA: Diagnosis not present

## 2019-10-30 DIAGNOSIS — D509 Iron deficiency anemia, unspecified: Secondary | ICD-10-CM | POA: Diagnosis not present

## 2019-10-30 DIAGNOSIS — E876 Hypokalemia: Secondary | ICD-10-CM | POA: Diagnosis not present

## 2019-10-30 DIAGNOSIS — Z992 Dependence on renal dialysis: Secondary | ICD-10-CM | POA: Diagnosis not present

## 2019-11-01 DIAGNOSIS — Z992 Dependence on renal dialysis: Secondary | ICD-10-CM | POA: Diagnosis not present

## 2019-11-01 DIAGNOSIS — N186 End stage renal disease: Secondary | ICD-10-CM | POA: Diagnosis not present

## 2019-11-01 DIAGNOSIS — E876 Hypokalemia: Secondary | ICD-10-CM | POA: Diagnosis not present

## 2019-11-01 DIAGNOSIS — E1122 Type 2 diabetes mellitus with diabetic chronic kidney disease: Secondary | ICD-10-CM | POA: Diagnosis not present

## 2019-11-01 DIAGNOSIS — D509 Iron deficiency anemia, unspecified: Secondary | ICD-10-CM | POA: Diagnosis not present

## 2019-11-01 DIAGNOSIS — N2581 Secondary hyperparathyroidism of renal origin: Secondary | ICD-10-CM | POA: Diagnosis not present

## 2019-11-01 DIAGNOSIS — D631 Anemia in chronic kidney disease: Secondary | ICD-10-CM | POA: Diagnosis not present

## 2019-11-02 DIAGNOSIS — J309 Allergic rhinitis, unspecified: Secondary | ICD-10-CM | POA: Diagnosis not present

## 2019-11-02 DIAGNOSIS — Z1211 Encounter for screening for malignant neoplasm of colon: Secondary | ICD-10-CM | POA: Diagnosis not present

## 2019-11-02 DIAGNOSIS — N186 End stage renal disease: Secondary | ICD-10-CM | POA: Diagnosis not present

## 2019-11-02 DIAGNOSIS — E1122 Type 2 diabetes mellitus with diabetic chronic kidney disease: Secondary | ICD-10-CM | POA: Diagnosis not present

## 2019-11-02 DIAGNOSIS — Z008 Encounter for other general examination: Secondary | ICD-10-CM | POA: Diagnosis not present

## 2019-11-02 DIAGNOSIS — Z0000 Encounter for general adult medical examination without abnormal findings: Secondary | ICD-10-CM | POA: Diagnosis not present

## 2019-11-02 DIAGNOSIS — Z794 Long term (current) use of insulin: Secondary | ICD-10-CM | POA: Diagnosis not present

## 2019-11-02 DIAGNOSIS — Z992 Dependence on renal dialysis: Secondary | ICD-10-CM | POA: Diagnosis not present

## 2019-11-03 DIAGNOSIS — D509 Iron deficiency anemia, unspecified: Secondary | ICD-10-CM | POA: Diagnosis not present

## 2019-11-03 DIAGNOSIS — D631 Anemia in chronic kidney disease: Secondary | ICD-10-CM | POA: Diagnosis not present

## 2019-11-03 DIAGNOSIS — Z992 Dependence on renal dialysis: Secondary | ICD-10-CM | POA: Diagnosis not present

## 2019-11-03 DIAGNOSIS — N186 End stage renal disease: Secondary | ICD-10-CM | POA: Diagnosis not present

## 2019-11-03 DIAGNOSIS — N2581 Secondary hyperparathyroidism of renal origin: Secondary | ICD-10-CM | POA: Diagnosis not present

## 2019-11-03 DIAGNOSIS — E876 Hypokalemia: Secondary | ICD-10-CM | POA: Diagnosis not present

## 2019-11-06 DIAGNOSIS — D509 Iron deficiency anemia, unspecified: Secondary | ICD-10-CM | POA: Diagnosis not present

## 2019-11-06 DIAGNOSIS — E876 Hypokalemia: Secondary | ICD-10-CM | POA: Diagnosis not present

## 2019-11-06 DIAGNOSIS — D631 Anemia in chronic kidney disease: Secondary | ICD-10-CM | POA: Diagnosis not present

## 2019-11-06 DIAGNOSIS — Z992 Dependence on renal dialysis: Secondary | ICD-10-CM | POA: Diagnosis not present

## 2019-11-06 DIAGNOSIS — N186 End stage renal disease: Secondary | ICD-10-CM | POA: Diagnosis not present

## 2019-11-06 DIAGNOSIS — N2581 Secondary hyperparathyroidism of renal origin: Secondary | ICD-10-CM | POA: Diagnosis not present

## 2019-11-08 DIAGNOSIS — N186 End stage renal disease: Secondary | ICD-10-CM | POA: Diagnosis not present

## 2019-11-08 DIAGNOSIS — Z992 Dependence on renal dialysis: Secondary | ICD-10-CM | POA: Diagnosis not present

## 2019-11-08 DIAGNOSIS — N2581 Secondary hyperparathyroidism of renal origin: Secondary | ICD-10-CM | POA: Diagnosis not present

## 2019-11-08 DIAGNOSIS — D509 Iron deficiency anemia, unspecified: Secondary | ICD-10-CM | POA: Diagnosis not present

## 2019-11-08 DIAGNOSIS — D631 Anemia in chronic kidney disease: Secondary | ICD-10-CM | POA: Diagnosis not present

## 2019-11-08 DIAGNOSIS — E876 Hypokalemia: Secondary | ICD-10-CM | POA: Diagnosis not present

## 2019-11-10 DIAGNOSIS — N2581 Secondary hyperparathyroidism of renal origin: Secondary | ICD-10-CM | POA: Diagnosis not present

## 2019-11-10 DIAGNOSIS — E876 Hypokalemia: Secondary | ICD-10-CM | POA: Diagnosis not present

## 2019-11-10 DIAGNOSIS — D631 Anemia in chronic kidney disease: Secondary | ICD-10-CM | POA: Diagnosis not present

## 2019-11-10 DIAGNOSIS — N186 End stage renal disease: Secondary | ICD-10-CM | POA: Diagnosis not present

## 2019-11-10 DIAGNOSIS — D509 Iron deficiency anemia, unspecified: Secondary | ICD-10-CM | POA: Diagnosis not present

## 2019-11-10 DIAGNOSIS — Z992 Dependence on renal dialysis: Secondary | ICD-10-CM | POA: Diagnosis not present

## 2019-11-10 DIAGNOSIS — Z1211 Encounter for screening for malignant neoplasm of colon: Secondary | ICD-10-CM | POA: Diagnosis not present

## 2019-11-13 ENCOUNTER — Ambulatory Visit: Payer: Medicare Other | Admitting: *Deleted

## 2019-11-13 DIAGNOSIS — D509 Iron deficiency anemia, unspecified: Secondary | ICD-10-CM | POA: Diagnosis not present

## 2019-11-13 DIAGNOSIS — N2581 Secondary hyperparathyroidism of renal origin: Secondary | ICD-10-CM | POA: Diagnosis not present

## 2019-11-13 DIAGNOSIS — Z992 Dependence on renal dialysis: Secondary | ICD-10-CM | POA: Diagnosis not present

## 2019-11-13 DIAGNOSIS — D631 Anemia in chronic kidney disease: Secondary | ICD-10-CM | POA: Diagnosis not present

## 2019-11-13 DIAGNOSIS — E876 Hypokalemia: Secondary | ICD-10-CM | POA: Diagnosis not present

## 2019-11-13 DIAGNOSIS — N186 End stage renal disease: Secondary | ICD-10-CM | POA: Diagnosis not present

## 2019-11-15 ENCOUNTER — Other Ambulatory Visit: Payer: Self-pay | Admitting: *Deleted

## 2019-11-15 ENCOUNTER — Other Ambulatory Visit: Payer: Self-pay

## 2019-11-15 DIAGNOSIS — N186 End stage renal disease: Secondary | ICD-10-CM | POA: Diagnosis not present

## 2019-11-15 DIAGNOSIS — E876 Hypokalemia: Secondary | ICD-10-CM | POA: Diagnosis not present

## 2019-11-15 DIAGNOSIS — Z992 Dependence on renal dialysis: Secondary | ICD-10-CM | POA: Diagnosis not present

## 2019-11-15 DIAGNOSIS — N2581 Secondary hyperparathyroidism of renal origin: Secondary | ICD-10-CM | POA: Diagnosis not present

## 2019-11-15 DIAGNOSIS — D509 Iron deficiency anemia, unspecified: Secondary | ICD-10-CM | POA: Diagnosis not present

## 2019-11-15 DIAGNOSIS — D631 Anemia in chronic kidney disease: Secondary | ICD-10-CM | POA: Diagnosis not present

## 2019-11-15 NOTE — Patient Outreach (Signed)
Welcome Destiny Day) Care Management  11/15/2019  NETASHA WEHRLI Dec 07, 1950 [id number]   [facility] complex care Follow up  Destiny Day was referred to [place] on 05/23/2019 for complex care services from [place] [place] [facility] after her discharge from [facility] on 05/22/19 for covid 19, PNA. Patient had been active with [facility] health coach services prior to this admission.  Destiny Day was re admitted on 06/25/19 to 06/27/19 for acute on chronic diastolic congestive Heart Failure (CHF) in the setting of ESRD.    Successful outreach to her mobile number  She is able to verify HIPAA ([place]) identifiers, date of birth (DOB) and address   Follow up assessment Destiny Day reports she is doing very well. She has not been hospitalized since 06/27/19 She continues to love to sing Destiny Day informs [facility] RN CM she has changed medical providers She reports establishing care at the [facility], [place], [place], [place] . She is seen by Dr Assunta Found  Chronic Kidney disease (CKD)-Destiny Day reports she is doing well at dialysis She reports her phosphorus was below 4.5 She reports her action plan was is that she is now taking her medicine with her meal. T She reports her craving for eating ice has decreased as she is now placing all her drinks for her fluid restriction in the freezer. She denies edema  Diabetes (DM) type 2 - She reports she has stopped use of insulin and is taking glipizide She is now receiving moms meals   COPD She reports Dr Assunta Found has confirmed with her that she has COPD and is helping her to manage at home   Destiny Day is a 69 year old female who lives at home with her spouse, [name] and recently her grandson. 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 She is a dialysis patient who is presently being seen on Tuesdays, Thursdays and Saturdays since her  discharge home on 05/22/19. She has been going to the [facility] at [place]. She went to dialysis son Tuesday 05/23/19.She reports she is independent with her care needs with assistance with some iADLs as she uses a walker for ambulation. She denies need of transportation assistance She has children, a son and daughter She states she has a daughter in Sports coach in [place].  She reports being on a fixed income of social security She reports she does not have a computer and does not know how to operate one She and her husband have been going out during the covid pandemic to pay bills and to grocery shop  She is reporting her son is coming home from [place] soon She reports also they are preparing to move to a one level home to assist with her and husband's mobility concerns.  A new one level home would prevent having to climb stairs  Conditions:Covid-19 positive on 05/17/19- negative on 05/30/19 & 06/01/19,  PNA, hypoxia,CHF, HTN, PAD, asthma, allergies,COPD, hx of CAP/PNA, GERD, hx of UGI bleed, DM type 2, hx of traumatic SDH, right side weakness, right achilles tendon contracture, right septic knee,CKD, hxof fall, anemia, hx of transmetatarsal of right foot   HKV:[id number], brace, w/c, cbg meter  appointments Dec 07 2019 Dr Salvatore Marvel Allergist January 04 2020 Cardiology Dr Adrian Prows   Plans: [facility] RN CM will follow up with Destiny Sarabia within the next 44-98 days as discussed today  for quarterly follow up [facility] RN CM will forward this note to MDs/PA/NP  [facility] CM Care Plan Problem One     Most Recent Value  Care Plan Problem One  Risk for re admission related to PNA/Covid/respiratory failure/CHF  Role Documenting the Problem One  Care Management Telephonic Coordinator  Care Plan for Problem One  Active  [facility] Long Term Goal   Over the next 31 days, patient will not experience hospital readmission, as evidence by patient reporting and review  of EMR during [facility] RN CCM outreach  [facility] Long Term Goal Start Date  06/29/19  [facility] Long Term Goal Met Date  08/15/19  TH[facility] CM Short Term Goal #1   over the next 10 days patient will have her oxygen saturation checked at dialysis and document the finding to report during the next follow up call  [facility] CM Short Term Goal #1 Start Date  05/24/19  [facility] CM Short Term Goal #1 Met Date  05/30/19    [facility] CM Care Plan Problem Two     Most Recent Value  Care Plan Problem Two  home management of CKD, COPD, CHF, DM, AFIB and HTN  Role Documenting the Problem Two  Care Management Telephonic Coordinator  Care Plan for Problem Two  Active  Interventions for Problem Two Long Term Goal   assesed home management of medical conditions  [facility] Long Term Goal  over the next 120 days patient will verbalize maintanence of CKD, COPD, CHF and HTN during follow up contact  [place] Term Goal Start Date  11/15/19  THN CM Short Term Goal #1   over the next 120 days patient will have decrease phosphorus value below 4.5 and increase compliance with [place] verbalized during follow up call and documented in EPIC  [facility] CM Short Term Goal #1 Start Date  08/15/19  [facility] CM Short Term Goal #1 Met Date   11/15/19  THN CM Short Term Goal #2   over thenext 100 days patient will verbalize phosphorous remains within normal limits duirng outreach  [facility] CM Short Term Goal #2 Start Date  11/15/19  Interventions for Short Term Goal #2  assessed  progress at dialysis, encouragement provided      Hjalmer Iovino L. Lavina Hamman, RN, BSN, [place] Coordinator Office number [phone] Mobile number [phone]  Main [facility] number [phone] Fax number [phone]

## 2019-11-17 DIAGNOSIS — E876 Hypokalemia: Secondary | ICD-10-CM | POA: Diagnosis not present

## 2019-11-17 DIAGNOSIS — D631 Anemia in chronic kidney disease: Secondary | ICD-10-CM | POA: Diagnosis not present

## 2019-11-17 DIAGNOSIS — D509 Iron deficiency anemia, unspecified: Secondary | ICD-10-CM | POA: Diagnosis not present

## 2019-11-17 DIAGNOSIS — Z992 Dependence on renal dialysis: Secondary | ICD-10-CM | POA: Diagnosis not present

## 2019-11-17 DIAGNOSIS — N186 End stage renal disease: Secondary | ICD-10-CM | POA: Diagnosis not present

## 2019-11-17 DIAGNOSIS — N2581 Secondary hyperparathyroidism of renal origin: Secondary | ICD-10-CM | POA: Diagnosis not present

## 2019-11-20 DIAGNOSIS — N2581 Secondary hyperparathyroidism of renal origin: Secondary | ICD-10-CM | POA: Diagnosis not present

## 2019-11-20 DIAGNOSIS — Z992 Dependence on renal dialysis: Secondary | ICD-10-CM | POA: Diagnosis not present

## 2019-11-20 DIAGNOSIS — D509 Iron deficiency anemia, unspecified: Secondary | ICD-10-CM | POA: Diagnosis not present

## 2019-11-20 DIAGNOSIS — N186 End stage renal disease: Secondary | ICD-10-CM | POA: Diagnosis not present

## 2019-11-20 DIAGNOSIS — E876 Hypokalemia: Secondary | ICD-10-CM | POA: Diagnosis not present

## 2019-11-20 DIAGNOSIS — D631 Anemia in chronic kidney disease: Secondary | ICD-10-CM | POA: Diagnosis not present

## 2019-11-22 DIAGNOSIS — N186 End stage renal disease: Secondary | ICD-10-CM | POA: Diagnosis not present

## 2019-11-22 DIAGNOSIS — E876 Hypokalemia: Secondary | ICD-10-CM | POA: Diagnosis not present

## 2019-11-22 DIAGNOSIS — D509 Iron deficiency anemia, unspecified: Secondary | ICD-10-CM | POA: Diagnosis not present

## 2019-11-22 DIAGNOSIS — D631 Anemia in chronic kidney disease: Secondary | ICD-10-CM | POA: Diagnosis not present

## 2019-11-22 DIAGNOSIS — N2581 Secondary hyperparathyroidism of renal origin: Secondary | ICD-10-CM | POA: Diagnosis not present

## 2019-11-22 DIAGNOSIS — Z992 Dependence on renal dialysis: Secondary | ICD-10-CM | POA: Diagnosis not present

## 2019-11-22 NOTE — Telephone Encounter (Signed)
Complete

## 2019-11-24 DIAGNOSIS — Z992 Dependence on renal dialysis: Secondary | ICD-10-CM | POA: Diagnosis not present

## 2019-11-24 DIAGNOSIS — D631 Anemia in chronic kidney disease: Secondary | ICD-10-CM | POA: Diagnosis not present

## 2019-11-24 DIAGNOSIS — E876 Hypokalemia: Secondary | ICD-10-CM | POA: Diagnosis not present

## 2019-11-24 DIAGNOSIS — D509 Iron deficiency anemia, unspecified: Secondary | ICD-10-CM | POA: Diagnosis not present

## 2019-11-24 DIAGNOSIS — N2581 Secondary hyperparathyroidism of renal origin: Secondary | ICD-10-CM | POA: Diagnosis not present

## 2019-11-24 DIAGNOSIS — N186 End stage renal disease: Secondary | ICD-10-CM | POA: Diagnosis not present

## 2019-11-25 DIAGNOSIS — N186 End stage renal disease: Secondary | ICD-10-CM | POA: Diagnosis not present

## 2019-11-25 DIAGNOSIS — I159 Secondary hypertension, unspecified: Secondary | ICD-10-CM | POA: Diagnosis not present

## 2019-11-25 DIAGNOSIS — Z992 Dependence on renal dialysis: Secondary | ICD-10-CM | POA: Diagnosis not present

## 2019-11-27 DIAGNOSIS — D631 Anemia in chronic kidney disease: Secondary | ICD-10-CM | POA: Diagnosis not present

## 2019-11-27 DIAGNOSIS — Z992 Dependence on renal dialysis: Secondary | ICD-10-CM | POA: Diagnosis not present

## 2019-11-27 DIAGNOSIS — E876 Hypokalemia: Secondary | ICD-10-CM | POA: Diagnosis not present

## 2019-11-27 DIAGNOSIS — N186 End stage renal disease: Secondary | ICD-10-CM | POA: Diagnosis not present

## 2019-11-27 DIAGNOSIS — N2581 Secondary hyperparathyroidism of renal origin: Secondary | ICD-10-CM | POA: Diagnosis not present

## 2019-11-29 DIAGNOSIS — Z992 Dependence on renal dialysis: Secondary | ICD-10-CM | POA: Diagnosis not present

## 2019-11-29 DIAGNOSIS — N186 End stage renal disease: Secondary | ICD-10-CM | POA: Diagnosis not present

## 2019-11-29 DIAGNOSIS — N2581 Secondary hyperparathyroidism of renal origin: Secondary | ICD-10-CM | POA: Diagnosis not present

## 2019-11-29 DIAGNOSIS — D631 Anemia in chronic kidney disease: Secondary | ICD-10-CM | POA: Diagnosis not present

## 2019-11-29 DIAGNOSIS — E876 Hypokalemia: Secondary | ICD-10-CM | POA: Diagnosis not present

## 2019-11-30 ENCOUNTER — Encounter: Payer: Self-pay | Admitting: *Deleted

## 2019-12-01 DIAGNOSIS — Z992 Dependence on renal dialysis: Secondary | ICD-10-CM | POA: Diagnosis not present

## 2019-12-01 DIAGNOSIS — E876 Hypokalemia: Secondary | ICD-10-CM | POA: Diagnosis not present

## 2019-12-01 DIAGNOSIS — N2581 Secondary hyperparathyroidism of renal origin: Secondary | ICD-10-CM | POA: Diagnosis not present

## 2019-12-01 DIAGNOSIS — N186 End stage renal disease: Secondary | ICD-10-CM | POA: Diagnosis not present

## 2019-12-01 DIAGNOSIS — D631 Anemia in chronic kidney disease: Secondary | ICD-10-CM | POA: Diagnosis not present

## 2019-12-04 DIAGNOSIS — N2581 Secondary hyperparathyroidism of renal origin: Secondary | ICD-10-CM | POA: Diagnosis not present

## 2019-12-04 DIAGNOSIS — Z992 Dependence on renal dialysis: Secondary | ICD-10-CM | POA: Diagnosis not present

## 2019-12-04 DIAGNOSIS — D631 Anemia in chronic kidney disease: Secondary | ICD-10-CM | POA: Diagnosis not present

## 2019-12-04 DIAGNOSIS — E876 Hypokalemia: Secondary | ICD-10-CM | POA: Diagnosis not present

## 2019-12-04 DIAGNOSIS — N186 End stage renal disease: Secondary | ICD-10-CM | POA: Diagnosis not present

## 2019-12-06 DIAGNOSIS — Z992 Dependence on renal dialysis: Secondary | ICD-10-CM | POA: Diagnosis not present

## 2019-12-06 DIAGNOSIS — E876 Hypokalemia: Secondary | ICD-10-CM | POA: Diagnosis not present

## 2019-12-06 DIAGNOSIS — D631 Anemia in chronic kidney disease: Secondary | ICD-10-CM | POA: Diagnosis not present

## 2019-12-06 DIAGNOSIS — N186 End stage renal disease: Secondary | ICD-10-CM | POA: Diagnosis not present

## 2019-12-06 DIAGNOSIS — N2581 Secondary hyperparathyroidism of renal origin: Secondary | ICD-10-CM | POA: Diagnosis not present

## 2019-12-07 ENCOUNTER — Ambulatory Visit (INDEPENDENT_AMBULATORY_CARE_PROVIDER_SITE_OTHER): Payer: Medicare Other | Admitting: Allergy & Immunology

## 2019-12-07 ENCOUNTER — Encounter: Payer: Self-pay | Admitting: Allergy & Immunology

## 2019-12-07 ENCOUNTER — Other Ambulatory Visit: Payer: Self-pay

## 2019-12-07 VITALS — BP 138/82 | HR 58 | Temp 98.0°F | Resp 18 | Ht 65.0 in | Wt 151.0 lb

## 2019-12-07 DIAGNOSIS — J4540 Moderate persistent asthma, uncomplicated: Secondary | ICD-10-CM

## 2019-12-07 DIAGNOSIS — J310 Chronic rhinitis: Secondary | ICD-10-CM

## 2019-12-07 MED ORDER — BUDESONIDE-FORMOTEROL FUMARATE 160-4.5 MCG/ACT IN AERO: INHALATION_SPRAY | RESPIRATORY_TRACT | 5 refills | Status: DC

## 2019-12-07 MED ORDER — FLUTICASONE PROPIONATE 50 MCG/ACT NA SUSP: 2.0000 | Freq: Every day | NASAL | 5 refills | Status: DC | PRN

## 2019-12-07 MED ORDER — ALBUTEROL SULFATE HFA 108 (90 BASE) MCG/ACT IN AERS: INHALATION_SPRAY | RESPIRATORY_TRACT | 1 refills | Status: DC

## 2019-12-07 MED ORDER — AZELASTINE HCL 0.1 % NA SOLN: 1.0000 | Freq: Two times a day (BID) | NASAL | 5 refills | Status: DC

## 2019-12-07 NOTE — Patient Instructions (Addendum)
1. Moderate persistent asthma, uncomplicated - Lung testing was in the 50-60% range today. - There was not much improvement with the Xopenex treatment.  - Spacer sample and demonstration provided. - Daily controller medication(s): Symbicort 160/4.3mcg two puffs twice daily with spacer - Prior to physical activity: albuterol 2 puffs 10-15 minutes before physical activity. - Rescue medications: albuterol 4 puffs every 4-6 hours as needed - Asthma control goals:  * Full participation in all desired activities (may need albuterol before activity) * Albuterol use two time or less a week on average (not counting use with activity) * Cough interfering with sleep two time or less a month * Oral steroids no more than once a year * No hospitalizations  2. Chronic rhinitis - We are going to get some blood work instead since you took antihistamines so recently. - We will call you in 1-2 weeks with the results of the testing. - In the meantime, stop the Zyrtec and start Xyzal 5mg  daily. - Continue with Flonase one spray per nostril twice daily. - Add Astelin one spray per nostril twice daily.  - We can schedule you for skin testing if needed in the future.   3. Return in about 6 weeks (around 01/18/2020). This can be an in-person, a virtual Webex or a telephone follow up visit.   Please inform us of any Emergency Department visits, hospitalizations, or changes in symptoms. Call us before going to the ED for breathing or allergy symptoms since we might be able to fit you in for a sick visit. Feel free to contact us anytime with any questions, problems, or concerns.  It was a pleasure to meet you today!  Websites that have reliable patient information: 1. American Academy of Asthma, Allergy, and Immunology: www.aaaai.org 2. Food Allergy Research and Education (FARE): foodallergy.org 3. Mothers of Asthmatics: http://www.asthmacommunitynetwork.org 4. American College of Allergy, Asthma, and  Immunology: www.acaai.org   COVID-19 Vaccine Information can be found at: ShippingScam.co.uk For questions related to vaccine distribution or appointments, please email [email or web address] or call 3126456388.     "Like" [place] on Facebook and Instagram for our latest updates!       HAPPY SPRING!  Make sure you are registered to vote! If you have moved or changed any of your contact information, you will need to get this updated before voting!  In some cases, you MAY be able to register to vote online: [email or web address]

## 2019-12-07 NOTE — Progress Notes (Signed)
NEW PATIENT  Date of Service/Encounter:  12/10/19  Referring provider: Loretha Brasil, FNP   Assessment:   Moderate persistent asthma, uncomplicated   Chronic rhinitis - getting labs at the next dialysis treatment  Plan/Recommendations:   1. Moderate persistent asthma, uncomplicated - Lung testing was in the 50-60% range today. - There was not much improvement with the Xopenex treatment.  - Spacer sample and demonstration provided. - Daily controller medication(s): Symbicort 160/4.76mcg two puffs twice daily with spacer - Prior to physical activity: albuterol 2 puffs 10-15 minutes before physical activity. - Rescue medications: albuterol 4 puffs every 4-6 hours as needed - Asthma control goals:  * Full participation in all desired activities (may need albuterol before activity) * Albuterol use two time or less a week on average (not counting use with activity) * Cough interfering with sleep two time or less a month * Oral steroids no more than once a year * No hospitalizations  2. Chronic rhinitis - We are going to get some blood work instead since you took antihistamines so recently. - We will call you in 1-2 weeks with the results of the testing. - In the meantime, stop the Zyrtec and start Xyzal 5mg  daily. - Continue with Flonase one spray per nostril twice daily. - Add Astelin one spray per nostril twice daily.  - We can schedule you for skin testing if needed in the future.   3. Return in about 6 weeks (around 01/18/2020). This can be an in-person, a virtual Webex or a telephone follow up visit.  Subjective:   Destiny Day is a 69 y.o. female presenting today for evaluation of  Chief Complaint  Patient presents with  . Allergic Rhinitis   . Asthma  . Gastroesophageal Reflux    Destiny Day has a history of the following: Patient Active Problem List   Diagnosis Date Noted  . Acute respiratory failure ([place]) 06/26/2019  . Paroxysmal atrial  fibrillation ([place]) 06/13/2019  . CAP (community acquired pneumonia) 05/17/2019  . Fluid overload 06/18/2018  . Old bucket handle tear of lateral meniscus of right knee   . Old peripheral tear of medial meniscus of right knee   . Septic arthritis of knee, right ([place]) 05/06/2018  . Degenerative tear of posterior horn of medial meniscus, right   . Old complex tear of lateral meniscus of right knee   . Loose body in knee, right   . Acute pulmonary edema ([place]) 02/21/2018  . 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[place]) 02/03/2018  . End-stage renal disease on hemodialysis ([place])   . Respiratory failure ([place]ain[place]) 01/21/2018  . Achilles tendon contracture, right 11/09/2017  . Labile blood glucose   . Anemia of infection and chronic disease   . Hypertension associated with diabetes ([place]undee)   . Black stool   . Right sided weakness   . Subdural hemorrhage following injury ([place]addock Lake) 08/30/2017  . Diabetic peripheral neuropathy ([place])   . Chronic obstructive pulmonary disease ([place])   . Seizure prophylaxis   . Subdural hematoma ([place]) 08/24/2017  . Traumatic subdural hematoma ([place]) 08/23/2017  . Fall   . SDH (subdural hematoma) ([place])   . Acute respiratory failure with hypoxia (Albertville)   . Diabetes mellitus type 2 in nonobese (HCC)   . Leukocytosis   . Labile blood pressure   . Generalized anxiety disorder   . Debility 08/16/2017  . Amputated great toe of right foot ([place]el[place])   . Amputated toe of left foot ([place])   .  Post-operative pain   . ESRD on dialysis ([place]akonda)   . Diabetes mellitus ([place])   . Anxiety state   . Benign essential HTN   . Acute blood loss anemia   . Anemia of chronic disease   . Diarrhea, unspecified 01/16/2017  . Dependence on renal dialysis ([place]) 01/06/2017  . Idiopathic chronic venous hypertension of both lower extremities with inflammation 10/13/2016  . S/P transmetatarsal amputation of foot, left ([place]) 09/21/2016  . Onychomycosis 08/15/2016  . Age-related osteoporosis  without current pathological fracture 07/11/2016  . Cervical disc disorder, unspecified, unspecified cervical region 07/11/2016  . Diverticulitis of intestine, part unspecified, without perforation or abscess without bleeding 07/11/2016  . Unspecified abdominal hernia without obstruction or gangrene 07/11/2016  . PAD (peripheral artery disease) ([place]) 07/08/2016  . Cellulitis of left toe 06/29/2016  . Atherosclerosis of native artery of left lower extremity with gangrene ([place]brook) 06/23/2016  . Complication of vascular dialysis catheter 02/29/2016  . Coagulation defect, unspecified (Wishram) 10/16/2015  . GERD (gastroesophageal reflux disease) 10/07/2015  . ARF (acute renal failure) ([place]alman)   . Hypothermia 06/12/2015  . Acute on chronic renal failure ([place]ruver) 06/12/2015  . CHF (congestive heart failure) ([place]field) 07/30/2014  . CKD (chronic kidney disease) stage 4, GFR 15-29 ml/min (HCC) 06/27/2014  . Hypertensive heart disease 05/25/2013  . CKD (chronic kidney disease) stage 3, GFR 30-59 ml/min 05/25/2013  . Pulmonary edema 05/23/2013  . Type 2 diabetes mellitus with diabetic nephropathy ([place]field) 05/23/2013  . Type 2 diabetes mellitus with hyperosmolar nonketotic hyperglycemia ([place]) 04/13/2013  . Acute on chronic diastolic heart failure ([place]) 04/13/2013  . UGI bleed 03/31/2013  . Hypokalemia 08/24/2012  . Type II or unspecified type diabetes mellitus without mention of complication, not stated as uncontrolled 12/27/2007  . Hyperlipidemia associated with type 2 diabetes mellitus ([place]una Vista) 12/27/2007  . Allergic rhinitis due to pollen 12/27/2007  . Asthma 12/27/2007    History obtained from: chart review and patient.  Destiny Day was referred by Loretha Brasil, FNP.     Destiny Day is a 69 y.o. female presenting for an evaluation of allergies and asthma.  Asthma/Respiratory Symptom History: She was on Symbicort at some point. She was diagnosed with that in the 1990s or early 2000s. She was still working  at the time. This might have been the early part of the 2000s. She stopped working in 2012. She is on the Symbicort only when she feels breathless. She did know that she was supposed to use this every day. She thinks that her PCP started her on this. The history is rather vague. She cannot take steroids because they mess up her fluids. The last time that she received steroids, she was in the hospital for two weeks. She refills her albuterol around once per year.   Allergic Rhinitis Symptom History: She was on allergy shots with [employer] in the late 1990s. She reports that they are a problem year round. She was allergic to trees, dust, molds, trees, dogs, cats, and maybe something else. She knows that she is allergic to wool since she cannot stand to wear it. She has some nose sprays but does not use them often.   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 She also has a history of ESRD and is on dialysis.  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   Otherwise, there is no history of other atopic diseases, including food allergies, drug allergies, stinging insect allergies, eczema,  urticaria or contact dermatitis. There is no significant infectious history. Vaccinations are up to date.    Past Medical History: Patient Active Problem List   Diagnosis Date Noted  . Acute respiratory failure ([place]) 06/26/2019  . Paroxysmal atrial fibrillation ([place] City) 06/13/2019  . CAP (community acquired pneumonia) 05/17/2019  . Fluid overload 06/18/2018  . Old bucket handle tear of lateral meniscus of right knee   . Old peripheral tear of medial meniscus of right knee   . Septic arthritis of knee, right (S[place]el) 05/06/2018  . Degenerative tear of posterior horn of medial meniscus, right   . Old complex tear of lateral meniscus of right knee   . Loose body in knee, right   . Acute pulmonary edema (Calpine) 02/21/2018  . History of transmetatarsal amputation of right foot  ([place]) 02/03/2018  . End-stage renal disease on hemodialysis (Grangeville)   . Respiratory failure ([place] Firs) 01/21/2018  . Achilles tendon contracture, right 11/09/2017  . Labile blood glucose   . Anemia of infection and chronic disease   . Hypertension associated with diabetes ([place]aden)   . Black stool   . Right sided weakness   . Subdural hemorrhage following injury ([place]) 08/30/2017  . Diabetic peripheral neuropathy (Rock[place])   . Chronic obstructive pulmonary disease ([place])   . Seizure prophylaxis   . Subdural hematoma ([place]) 08/24/2017  . Traumatic subdural hematoma (Coun[place] Grove) 08/23/2017  . Fall   . SDH (subdural hematoma) ([place])   . Acute respiratory failure with hypoxia ([place])   . Diabetes mellitus type 2 in nonobese (HCC)   . Leukocytosis   . Labile blood pressure   . Generalized anxiety disorder   . Debility 08/16/2017  . Amputated great toe of right foot ([place]le[place] Ridge)   . Amputated toe of left foot ([place])   . Post-operative pain   . ESRD on dialysis (T[place] Am[place]illa)   . Diabetes mellitus ([place]on Lake)   . Anxiety state   . Benign essential HTN   . Acute blood loss anemia   . Anemia of chronic disease   . Diarrhea, unspecified 01/16/2017  . Dependence on renal dialysis (Ceres) 01/06/2017  . Idiopathic chronic venous hypertension of both lower extremities with inflammation 10/13/2016  . S/P transmetatarsal amputation of foot, left (Lathrop) 09/21/2016  . Onychomycosis 08/15/2016  . Age-related osteoporosis without current pathological fracture 07/11/2016  . Cervical disc disorder, unspecified, unspecified cervical region 07/11/2016  . Diverticulitis of intestine, part unspecified, without perforation or abscess without bleeding 07/11/2016  . Unspecified abdominal hernia without obstruction or gangrene 07/11/2016  . PAD (peripheral artery disease) (Heppner) 07/08/2016  . Cellulitis of left toe 06/29/2016  . Atherosclerosis of native artery of left lower extremity with gangrene (Elysian) 06/23/2016  . Complication of vascular dialysis  catheter 02/29/2016  . Coagulation defect, unspecified (Gwynn) 10/16/2015  . GERD (gastroesophageal reflux disease) 10/07/2015  . ARF (acute renal failure) (Grainfield)   . Hypothermia 06/12/2015  . Acute on chronic renal failure ([place]) 06/12/2015  . CHF (congestive heart failure) (Peabody) 07/30/2014  . CKD (chronic kidney disease) stage 4, GFR 15-29 ml/min (HCC) 06/27/2014  . Hypertensive heart disease 05/25/2013  . CKD (chronic kidney disease) stage 3, GFR 30-59 ml/min 05/25/2013  . Pulmonary edema 05/23/2013  . Type 2 diabetes mellitus with diabetic nephropathy (Carthage) 05/23/2013  . Type 2 diabetes mellitus with hyperosmolar nonketotic hyperglycemia ([place]) 04/13/2013  . Acute on chronic diastolic heart failure ([place]) 04/13/2013  . UGI bleed 03/31/2013  . Hypokalemia 08/24/2012  . Type II or unspecified type diabetes  mellitus without mention of complication, not stated as uncontrolled 12/27/2007  . Hyperlipidemia associated with type 2 diabetes mellitus ([place]) 12/27/2007  . Allergic rhinitis due to pollen 12/27/2007  . Asthma 12/27/2007    Medication List:  Allergies as of 12/07/2019      Reactions   Adhesive  [tape] Hives   Lisinopril Cough   Prednisone Swelling, Other (See Comments)   Excessive fluid buildup      Medication List       Accurate as of Dec 07, 2019 11:59 PM. If you have any questions, ask your nurse or doctor.        albuterol 108 (90 Base) MCG/ACT inhaler Commonly known as: ProAir HFA 4 puffs every 4-6 hours as needed What changed:   how much to take  how to take this  when to take this  reasons to take this  additional instructions Changed by: Valentina Shaggy, MD   amLODipine 10 MG tablet Commonly known as: NORVASC Take 10 mg by mouth at bedtime.   aspirin EC 81 MG tablet Take 81 mg by mouth daily.   atorvastatin 20 MG tablet Commonly known as: LIPITOR TAKE 1 TABLET BY MOUTH EVERY DAY What changed: when to take this   Auryxia 1 GM 210 MG(Fe)  tablet Generic drug: ferric citrate Take 210-420 mg by mouth See admin instructions. Take 2 tablets (420 mg) by mouth with each meal & take 1 tablet (210 mg) by mouth with snacks.   azelastine 0.1 % nasal spray Commonly known as: ASTELIN Place 1 spray into both nostrils 2 (two) times daily. Started by: Valentina Shaggy, MD   budesonide-formoterol 160-4.5 MCG/ACT inhaler Commonly known as: SYMBICORT 2 puffs twice daily with spacer What changed:   how much to take  how to take this  when to take this  additional instructions Changed by: Valentina Shaggy, MD   busPIRone 7.5 MG tablet Commonly known as: BUSPAR   carvedilol 12.5 MG tablet Commonly known as: COREG Take 12.5 mg by mouth See admin instructions. Take 1 tablet (12.5 mg) by mouth once daily in the evening on Mondays, Wednesdays, & Fridays. (Dialysis) Take 1 tablet (12.5 mg) by mouth twice daily on Sundays, Tuesdays, Thursdays, & Saturdays. (Non Dialysis)   cetirizine 10 MG tablet Commonly known as: ZYRTEC Take 1 tablet (10 mg total) by mouth at bedtime.   cinacalcet 60 MG tablet Commonly known as: SENSIPAR Take 90 mg by mouth See admin instructions. Take 1 tablet (90 mg) by mouth in the evening on Mondays, Wednesdays, & Fridays (dialysis) Take 1 tablet (90 mg) by mouth twice daily on Sundays, Tuesdays, Thursdays, Saturdays. (non dialysis)   diazepam 5 MG tablet Commonly known as: VALIUM Take 5 mg by mouth 2 (two) times daily as needed for anxiety.   fluticasone 50 MCG/ACT nasal spray Commonly known as: FLONASE Place 2 sprays into both nostrils daily as needed for allergies or rhinitis. What changed: how much to take Changed by: Valentina Shaggy, MD   gabapentin 100 MG capsule Commonly known as: NEURONTIN TAKE 1 CAPSULE (100 MG TOTAL) BY MOUTH AT BEDTIME. WHEN NECESSARY FOR NEUROPATHY PAIN   glipiZIDE 5 MG tablet Commonly known as: GLUCOTROL   hydrocerin Crea Apply 163 application topically 2  (two) times daily. What changed: how much to take   HYDROcodone-homatropine 5-1.5 MG/5ML syrup Commonly known as: HYCODAN Take 5 mLs by mouth every 8 (eight) hours as needed for cough.   insulin regular 100 units/mL injection Commonly  known as: NOVOLIN R Inject 3-8 Units into the skin See admin instructions. 3 times daily with meals per sliding scale except on Dialysis days, Mon, Wed, Fri   isosorbide-hydrALAZINE 20-37.5 MG tablet Commonly known as: BIDIL Take 1 tablet by mouth 3 (three) times daily. On NON dialysis days pt takes 1 tab 3 times daily, and on Dialysis days, pt takes 1 tab every evning What changed:   when to take this  additional instructions   lidocaine-prilocaine cream Commonly known as: EMLA Apply 1 application topically 3 (three) times a week. 1-2 hours prior to dialysis   multivitamin Tabs tablet Take 1 tablet by mouth daily.   omeprazole 20 MG capsule Commonly known as: PRILOSEC Take 20 mg by mouth daily.   ondansetron 4 MG tablet Commonly known as: ZOFRAN Take 1 tablet (4 mg total) by mouth every 6 (six) hours as needed for nausea.   senna 8.6 MG tablet Commonly known as: SENOKOT Take 1 tablet by mouth as needed for constipation.   traZODone 50 MG tablet Commonly known as: DESYREL   Vitamin D (Ergocalciferol) 1.25 MG (50000 UNIT) Caps capsule Commonly known as: DRISDOL Take 50,000 Units by mouth every Sunday. Takes on Sunday       Birth History: non-contributory  Developmental History: non-contributory  Past Surgical History: Past Surgical History:  Procedure Laterality Date  . A/V SHUNTOGRAM N/A 09/22/2016   Procedure: A/V Shuntogram - left arm;  Surgeon: Serafina Mitchell, MD;  Location: [facility];  Service: Cardiovascular;  Laterality: N/A;  . A/V SHUNTOGRAM N/A 03/22/2018   Procedure: A/V SHUNTOGRAM - left arm;  Surgeon: Serafina Mitchell, MD;  Location: [facility];  Service: Cardiovascular;  Laterality: N/A;  . A/V  SHUNTOGRAM Left 11/17/2018   Procedure: A/V SHUNTOGRAM;  Surgeon: Angelia Mould, MD;  Location: [facility];  Service: Cardiovascular;  Laterality: Left;  . ABDOMINAL HYSTERECTOMY  1993`  . AMPUTATION Left 09/01/2016   Procedure: LEFT FOOT TRANSMETATARSAL AMPUTATION;  Surgeon: Newt Minion, MD;  Location: [facility];  Service: Orthopedics;  Laterality: Left;  . AMPUTATION Right 08/11/2017   Procedure: RIGHT GREAT TOE AMPUTATION DIGIT;  Surgeon: Rosetta Posner, MD;  Location: [facility];  Service: Vascular;  Laterality: Right;  . AMPUTATION Right 12/31/2017   Procedure: RIGHT TRANSMETATARSAL AMPUTATION;  Surgeon: Newt Minion, MD;  Location: [facility];  Service: Orthopedics;  Laterality: Right;  . AV FISTULA PLACEMENT Left 04/21/2016   Procedure: INSERTION OF ARTERIOVENOUS (AV) GORE-TEX GRAFT ARM LEFT;  Surgeon: Elam Dutch, MD;  Location: [facility];  Service: Vascular;  Laterality: Left;  . [place] TRANSPOSITION Left 07/10/2014   Procedure: BASCILIC VEIN TRANSPOSITION;  Surgeon: Angelia Mould, MD;  Location: [facility];  Service: Vascular;  Laterality: Left;  . BASCILIC VEIN TRANSPOSITION Right 11/08/2014   Procedure: FIRST STAGE BASILIC VEIN TRANSPOSITION;  Surgeon: Angelia Mould, MD;  Location: [facility];  Service: Vascular;  Laterality: Right;  . BASCILIC VEIN TRANSPOSITION Right 01/18/2015   Procedure: SECOND STAGE BASILIC VEIN TRANSPOSITION;  Surgeon: Angelia Mould, MD;  Location: [facility];  Service: Vascular;  Laterality: Right;  . CRANIOTOMY N/A 08/23/2017   Procedure: CRANIOTOMY HEMATOMA EVACUATION SUBDURAL;  Surgeon: Ashok Pall, MD;  Location: [facility];  Service: Neurosurgery;  Laterality: N/A;  . CRANIOTOMY Left 08/24/2017   Procedure: CRANIOTOMY FOR RECURRENT ACUTE SUBDURAL HEMATOMA;  Surgeon: Ashok Pall, MD;  Location: [facility];  Service: Neurosurgery;  Laterality: Left;  . ESOPHAGOGASTRODUODENOSCOPY  N/A 03/31/2013   Procedure: ESOPHAGOGASTRODUODENOSCOPY (EGD);  Surgeon:  Gatha Mayer, MD;  Location[facility];  Service: Endoscopy;  Laterality: N/A;  . EYE SURGERY     laser surgery  . FEMORAL-POPLITEAL BYPASS GRAFT Left 07/08/2016   Procedure: LEFT  FEMORAL-BELOW KNEE POPLITEAL ARTERY BYPASS GRAFT USING 6MM X 80 CM PROPATEN GORETEX GRAFT WITH RINGS.;  Surgeon: Rosetta Posner, MD;  Location: [facility];  Service: Vascular;  Laterality: Left;  . FEMORAL-POPLITEAL BYPASS GRAFT Right 08/11/2017   Procedure: RIGHT FEMORAL TO BELOW KNEE POPLITEAL ARTERKY  BYPASS GRAFT USING 6MM RINGED PROPATEN GRAFT;  Surgeon: Rosetta Posner, MD;  Location: [facility];  Service: Vascular;  Laterality: Right;  . FISTULOGRAM Left 10/29/2014   Procedure: FISTULOGRAM;  Surgeon: Angelia Mould, MD;  Location: [facility] LAB;  Service: Cardiovascular;  Laterality: Left;  Marland Kitchen GAS/FLUID EXCHANGE Left 12/20/2018   Procedure: AIR GAS EXCHANGE;  Surgeon: Jalene Mullet, MD;  Location: [facility];  Service: Ophthalmology;  Laterality: Left;  . KNEE ARTHROSCOPY Right 05/06/2018   Procedure: RIGHT KNEE ARTHROSCOPY;  Surgeon: Newt Minion, MD;  Location: [place];  Service: Orthopedics;  Laterality: Right;  . KNEE ARTHROSCOPY Right 06/10/2018   Procedure: RIGHT KNEE ARTHROSCOPY AND DEBRIDEMENT;  Surgeon: Newt Minion, MD;  Location: [facility];  Service: Orthopedics;  Laterality: Right;  . LIGATION OF ARTERIOVENOUS  FISTULA Left 04/21/2016   Procedure: LIGATION OF ARTERIOVENOUS  FISTULA LEFT ARM;  Surgeon: Elam Dutch, MD;  Location: [facility];  Service: Vascular;  Laterality: Left;  . LOWER EXTREMITY ANGIOGRAPHY N/A 04/06/2017   Procedure: Lower Extremity Angiography - Right;  Surgeon: Serafina Mitchell, MD;  Location: [place] [facility];  Service: Cardiovascular;  Laterality: N/A;  . MEMBRANE PEEL Left 12/20/2018   Procedure: MEMBRANE PEEL;  Surgeon: Jalene Mullet, MD;  Location: [facility] [facility];  Service: Ophthalmology;  Laterality: Left;  . PATCH ANGIOPLASTY Right 01/18/2015   Procedure: BASILIC VEIN PATCH ANGIOPLASTY USING  VASCUGUARD PATCH;  Surgeon: Angelia Mould, MD;  Location: [place] [facility];  Service: Vascular;  Laterality: Right;  . PERIPHERAL VASCULAR BALLOON ANGIOPLASTY  09/22/2016   Procedure: Peripheral Vascular Balloon Angioplasty;  Surgeon: Serafina Mitchell, MD;  Location: [facility];  Service: Cardiovascular;;  Lt. Fistula  . PERIPHERAL VASCULAR BALLOON ANGIOPLASTY Left 03/22/2018   Procedure: PERIPHERAL VASCULAR BALLOON ANGIOPLASTY;  Surgeon: Serafina Mitchell, MD;  Location: [facility];  Service: Cardiovascular;  Laterality: Left;  Arm shunt  . PERIPHERAL VASCULAR CATHETERIZATION N/A 06/23/2016   Procedure: Abdominal Aortogram w/Lower Extremity;  Surgeon: Serafina Mitchell, MD;  Location: [facility];  Service: Cardiovascular;  Laterality: N/A;  . PERIPHERAL VASCULAR CATHETERIZATION  06/23/2016   Procedure: Peripheral Vascular Intervention;  Surgeon: Serafina Mitchell, MD;  Location: [place] [facility];  Service: Cardiovascular;;  lt common and external illiac artery  . PHOTOCOAGULATION WITH LASER Left 12/20/2018   Procedure: PHOTOCOAGULATION WITH LASER;  Surgeon: Jalene Mullet, MD;  Location: [place];  Service: Ophthalmology;  Laterality: Left;  . 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 Other Mother        not sure of cause of death  . Diabetes Father   . Pancreatic cancer Maternal Grandmother   .  Colon cancer Neg Hx      Social History   Tobacco Use  . Smoking status: Former Smoker    Packs/day: 0.35    Years: 40.00    Pack years: 14.00    Types: Cigarettes    Quit date: 05/10/2012    Years since quitting: 7.5  . Smokeless tobacco: Never Used  Substance Use Topics  . Alcohol use: No  . Drug use: No    Comment: marijuana; quit  in early 1980's     Review of Systems  Constitutional: Negative.  Negative for chills, fever, malaise/fatigue and weight loss.  HENT: Positive for congestion and sinus pain. Negative for ear discharge, ear pain and sore throat.   Eyes: Negative for pain, discharge and redness.  Respiratory: Positive for cough. Negative for sputum production, shortness of breath and wheezing.   Cardiovascular: Negative.  Negative for chest pain and palpitations.  Gastrointestinal: Negative for abdominal pain, constipation, diarrhea, heartburn, nausea and vomiting.  Skin: Negative.  Negative for itching and rash.  Neurological: Negative for dizziness and headaches.  Endo/Heme/Allergies: Negative for environmental allergies. Does not bruise/bleed easily.       Objective:   Blood pressure 138/82, pulse (!) 58, temperature 98 F (36.7 C), temperature source Temporal, resp. rate 18, height 5\' 5"  (1.651 m), weight 151 lb (68.5 kg), SpO2 98 %. Body mass index is 25.13 kg/m.   Physical Exam:   Physical Exam  Constitutional: She appears well-developed.  HENT:  Head: Normocephalic and atraumatic.  Right Ear: Tympanic membrane, external ear and ear canal normal. No drainage, swelling or tenderness. Tympanic membrane is not injected, not scarred, not erythematous, not retracted and not bulging.  Left Ear: Tympanic membrane, external ear and ear canal normal. No drainage, swelling or tenderness. Tympanic membrane is not injected, not scarred, not erythematous, not retracted and not bulging.  Nose: No mucosal edema, rhinorrhea, nasal deformity or septal deviation. No epistaxis. Right sinus exhibits no maxillary sinus tenderness and no frontal sinus tenderness. Left sinus exhibits no maxillary sinus tenderness and no frontal sinus tenderness.  Mouth/Throat: Uvula is midline and oropharynx is clear and moist. Mucous membranes are not pale and not dry.  Somewhat enlarged turbinates bilaterally.   Eyes: Pupils are  equal, round, and reactive to light. Conjunctivae and EOM are normal. Right eye exhibits no chemosis and no discharge. Left eye exhibits no chemosis and no discharge. Right conjunctiva is not injected. Left conjunctiva is not injected.  Cardiovascular: Normal rate, regular rhythm and normal heart sounds.  Respiratory: Effort normal and breath sounds normal. No accessory muscle usage. No tachypnea. No respiratory distress. She has no wheezes. She has no rhonchi. She has no rales. She exhibits no tenderness.  Poor air movement at the bases, but difficult to ascertain whether this is just her lack of effort.   GI: There is no abdominal tenderness. There is no rebound and no guarding.  Lymphadenopathy:       Head (right side): No submandibular, no tonsillar and no occipital adenopathy present.       Head (left side): No submandibular, no tonsillar and no occipital adenopathy present.    She has no cervical adenopathy.  Neurological: She is alert.  Skin: No abrasion, no petechiae and no rash noted. Rash is not papular, not vesicular and not urticarial. No erythema. No pallor.  No eczematous or urticarial lesions noted.   Psychiatric: She has a normal mood and affect.     Diagnostic studies:    Spirometry:  results abnormal (FEV1: 1.12/57%, FVC: 1.66/66%, FEV1/FVC: 69%).    Spirometry consistent with possible restrictive disease.   Allergy Studies: none (sending labs instead)     Salvatore Marvel, MD [facility]

## 2019-12-08 DIAGNOSIS — N2581 Secondary hyperparathyroidism of renal origin: Secondary | ICD-10-CM | POA: Diagnosis not present

## 2019-12-08 DIAGNOSIS — E876 Hypokalemia: Secondary | ICD-10-CM | POA: Diagnosis not present

## 2019-12-08 DIAGNOSIS — Z992 Dependence on renal dialysis: Secondary | ICD-10-CM | POA: Diagnosis not present

## 2019-12-08 DIAGNOSIS — N186 End stage renal disease: Secondary | ICD-10-CM | POA: Diagnosis not present

## 2019-12-08 DIAGNOSIS — J310 Chronic rhinitis: Secondary | ICD-10-CM | POA: Diagnosis not present

## 2019-12-08 DIAGNOSIS — D631 Anemia in chronic kidney disease: Secondary | ICD-10-CM | POA: Diagnosis not present

## 2019-12-10 ENCOUNTER — Encounter: Payer: Self-pay | Admitting: Allergy & Immunology

## 2019-12-11 DIAGNOSIS — E876 Hypokalemia: Secondary | ICD-10-CM | POA: Diagnosis not present

## 2019-12-11 DIAGNOSIS — N2581 Secondary hyperparathyroidism of renal origin: Secondary | ICD-10-CM | POA: Diagnosis not present

## 2019-12-11 DIAGNOSIS — Z992 Dependence on renal dialysis: Secondary | ICD-10-CM | POA: Diagnosis not present

## 2019-12-11 DIAGNOSIS — D631 Anemia in chronic kidney disease: Secondary | ICD-10-CM | POA: Diagnosis not present

## 2019-12-11 DIAGNOSIS — N186 End stage renal disease: Secondary | ICD-10-CM | POA: Diagnosis not present

## 2019-12-13 DIAGNOSIS — Z992 Dependence on renal dialysis: Secondary | ICD-10-CM | POA: Diagnosis not present

## 2019-12-13 DIAGNOSIS — N2581 Secondary hyperparathyroidism of renal origin: Secondary | ICD-10-CM | POA: Diagnosis not present

## 2019-12-13 DIAGNOSIS — E876 Hypokalemia: Secondary | ICD-10-CM | POA: Diagnosis not present

## 2019-12-13 DIAGNOSIS — N186 End stage renal disease: Secondary | ICD-10-CM | POA: Diagnosis not present

## 2019-12-13 DIAGNOSIS — D631 Anemia in chronic kidney disease: Secondary | ICD-10-CM | POA: Diagnosis not present

## 2019-12-13 DIAGNOSIS — I159 Secondary hypertension, unspecified: Secondary | ICD-10-CM

## 2019-12-13 LAB — ALLERGEN PROFILE, MOLD
Aureobasidi Pullulans IgE: 0.1 kU/L — AB
Candida Albicans IgE: 2.14 kU/L — AB
M009-IgE Fusarium proliferatum: 0.15 kU/L — AB
M014-IgE Epicoccum purpur: 0.19 kU/L — AB
Phoma Betae IgE: 0.3 kU/L — AB
Setomelanomma Rostrat: 0.16 kU/L — AB

## 2019-12-13 LAB — CBC WITH DIFFERENTIAL/PLATELET
Basophils Absolute: 0 10*3/uL (ref 0.0–0.2)
Basos: 1 %
EOS (ABSOLUTE): 0.4 10*3/uL (ref 0.0–0.4)
Eos: 7 %
Hematocrit: 32.1 % — ABNORMAL LOW (ref 34.0–46.6)
Hemoglobin: 11.2 g/dL (ref 11.1–15.9)
Immature Grans (Abs): 0 10*3/uL (ref 0.0–0.1)
Immature Granulocytes: 0 %
Lymphocytes Absolute: 2 10*3/uL (ref 0.7–3.1)
Lymphs: 31 %
MCH: 32.1 pg (ref 26.6–33.0)
MCHC: 34.9 g/dL (ref 31.5–35.7)
MCV: 92 fL (ref 79–97)
Monocytes Absolute: 0.4 10*3/uL (ref 0.1–0.9)
Monocytes: 7 %
Neutrophils Absolute: 3.5 10*3/uL (ref 1.4–7.0)
Neutrophils: 54 %
Platelets: 269 10*3/uL (ref 150–450)
RBC: 3.49 x10E6/uL — ABNORMAL LOW (ref 3.77–5.28)
RDW: 12.2 % (ref 11.7–15.4)
WBC: 6.4 10*3/uL (ref 3.4–10.8)

## 2019-12-13 LAB — IGE+ALLERGENS ZONE 2(30)
Alternaria Alternata IgE: 0.21 kU/L — AB
Amer Sycamore IgE Qn: <0.1 kU/L
Aspergillus Fumigatus IgE: 0.24 kU/L — AB
Bahia Grass IgE: 0.1 kU/L — AB
Bermuda Grass IgE: <0.1 kU/L
Cat Dander IgE: <0.1 kU/L
Cedar, Mountain IgE: <0.1 kU/L
Cladosporium Herbarum IgE: <0.1 kU/L
Cockroach, American IgE: <0.1 kU/L
Common Silver Birch IgE: <0.1 kU/L
D Farinae IgE: 0.11 kU/L — AB
D Pteronyssinus IgE: 0.15 kU/L — AB
Dog Dander IgE: 0.13 kU/L — AB
Elm, American IgE: <0.1 kU/L
Hickory, White IgE: <0.1 kU/L
IgE (Immunoglobulin E), Serum: 327 IU/mL (ref 6–495)
Johnson Grass IgE: <0.1 kU/L
Maple/Box Elder IgE: <0.1 kU/L
Mucor Racemosus IgE: <0.1 kU/L
Mugwort IgE Qn: <0.1 kU/L
Nettle IgE: <0.1 kU/L
Oak, White IgE: <0.1 kU/L
Penicillium Chrysogen IgE: <0.1 kU/L
Pigweed, Rough IgE: <0.1 kU/L
Plantain, English IgE: <0.1 kU/L
Ragweed, Short IgE: <0.1 kU/L
Sheep Sorrel IgE Qn: <0.1 kU/L
Stemphylium Herbarum IgE: 0.18 kU/L — AB
Sweet gum IgE RAST Ql: <0.1 kU/L
Timothy Grass IgE: 0.11 kU/L — AB
White Mulberry IgE: <0.1 kU/L

## 2019-12-15 DIAGNOSIS — D631 Anemia in chronic kidney disease: Secondary | ICD-10-CM | POA: Diagnosis not present

## 2019-12-15 DIAGNOSIS — N186 End stage renal disease: Secondary | ICD-10-CM | POA: Diagnosis not present

## 2019-12-15 DIAGNOSIS — E876 Hypokalemia: Secondary | ICD-10-CM | POA: Diagnosis not present

## 2019-12-15 DIAGNOSIS — Z992 Dependence on renal dialysis: Secondary | ICD-10-CM | POA: Diagnosis not present

## 2019-12-15 DIAGNOSIS — N2581 Secondary hyperparathyroidism of renal origin: Secondary | ICD-10-CM | POA: Diagnosis not present

## 2019-12-18 DIAGNOSIS — N186 End stage renal disease: Secondary | ICD-10-CM | POA: Diagnosis not present

## 2019-12-18 DIAGNOSIS — D631 Anemia in chronic kidney disease: Secondary | ICD-10-CM | POA: Diagnosis not present

## 2019-12-18 DIAGNOSIS — E876 Hypokalemia: Secondary | ICD-10-CM | POA: Diagnosis not present

## 2019-12-18 DIAGNOSIS — Z992 Dependence on renal dialysis: Secondary | ICD-10-CM | POA: Diagnosis not present

## 2019-12-18 DIAGNOSIS — N2581 Secondary hyperparathyroidism of renal origin: Secondary | ICD-10-CM | POA: Diagnosis not present

## 2019-12-20 DIAGNOSIS — N2581 Secondary hyperparathyroidism of renal origin: Secondary | ICD-10-CM | POA: Diagnosis not present

## 2019-12-20 DIAGNOSIS — Z992 Dependence on renal dialysis: Secondary | ICD-10-CM | POA: Diagnosis not present

## 2019-12-20 DIAGNOSIS — D631 Anemia in chronic kidney disease: Secondary | ICD-10-CM | POA: Diagnosis not present

## 2019-12-20 DIAGNOSIS — E876 Hypokalemia: Secondary | ICD-10-CM | POA: Diagnosis not present

## 2019-12-20 DIAGNOSIS — N186 End stage renal disease: Secondary | ICD-10-CM | POA: Diagnosis not present

## 2019-12-22 DIAGNOSIS — E876 Hypokalemia: Secondary | ICD-10-CM | POA: Diagnosis not present

## 2019-12-22 DIAGNOSIS — N186 End stage renal disease: Secondary | ICD-10-CM | POA: Diagnosis not present

## 2019-12-22 DIAGNOSIS — N2581 Secondary hyperparathyroidism of renal origin: Secondary | ICD-10-CM | POA: Diagnosis not present

## 2019-12-22 DIAGNOSIS — D631 Anemia in chronic kidney disease: Secondary | ICD-10-CM | POA: Diagnosis not present

## 2019-12-22 DIAGNOSIS — Z992 Dependence on renal dialysis: Secondary | ICD-10-CM | POA: Diagnosis not present

## 2019-12-25 DIAGNOSIS — N2581 Secondary hyperparathyroidism of renal origin: Secondary | ICD-10-CM | POA: Diagnosis not present

## 2019-12-25 DIAGNOSIS — E876 Hypokalemia: Secondary | ICD-10-CM | POA: Diagnosis not present

## 2019-12-25 DIAGNOSIS — D631 Anemia in chronic kidney disease: Secondary | ICD-10-CM | POA: Diagnosis not present

## 2019-12-25 DIAGNOSIS — N186 End stage renal disease: Secondary | ICD-10-CM | POA: Diagnosis not present

## 2019-12-25 DIAGNOSIS — Z992 Dependence on renal dialysis: Secondary | ICD-10-CM | POA: Diagnosis not present

## 2020-01-01 ENCOUNTER — Other Ambulatory Visit: Payer: Self-pay

## 2020-01-01 ENCOUNTER — Ambulatory Visit (HOSPITAL_COMMUNITY)
Admission: EM | Admit: 2020-01-01 | Discharge: 2020-01-01 | Disposition: A | Discharge status: Home / Self Care | Payer: Medicare Other | Attending: Family Medicine | Admitting: Family Medicine

## 2020-01-01 ENCOUNTER — Encounter (HOSPITAL_COMMUNITY): Payer: Self-pay | Admitting: Emergency Medicine

## 2020-01-01 DIAGNOSIS — R05 Cough: Secondary | ICD-10-CM

## 2020-01-01 DIAGNOSIS — R059 Cough, unspecified: Secondary | ICD-10-CM

## 2020-01-01 MED ORDER — MONTELUKAST SODIUM 10 MG PO TABS: 10.0000 mg | ORAL_TABLET | Freq: Every day | ORAL | 0 refills | Status: DC

## 2020-01-01 NOTE — ED Provider Notes (Signed)
[place]    CSN: [id number] Arrival date & time: 01/01/20  1632      History   Chief Complaint Chief Complaint  Patient presents with  . Cough    HPI Destiny Day is a 69 y.o. female.   She is presenting with a chronic cough.  It is a dry cough and nonproductive.  This has been ongoing for several months.  She has been seen by her allergist.  She reports no shortness of breath with lying down.  Her volume has been normal with dialysis.  She has not had any retention of fluid.  No new or different exposures.  HPI  Past Medical History:  Diagnosis Date  . Abdominal bruit   . Anemia   . Anxiety   . Arthritis    Osteoarthritis  . Asthma   . Cervical disc disease    "pinced nerve"  . CHF (congestive heart failure) ([place])   . Complication of anesthesia    " after I got home from my last procedure, I started itching."  . Diabetes mellitus    Type II  . Diverticulitis   . ESRD (end stage renal disease) ([place])    dialysis - M/W/F- [place]  . GERD (gastroesophageal reflux disease)    from medications  . GI bleed 03/31/2013  . Head injury 07/2017  . History of hiatal hernia   . Hyperlipidemia   . Hypertension   . Neuropathy    left leg  . Osteoporosis   . Peripheral vascular disease ([place])   . Pneumonia    "very young" and a few years ago  . PONV (postoperative nausea and vomiting)   . Seasonal allergies   . Shortness of breath dyspnea    WIth exertion, when fluid builds  . Sleep apnea    can't afford cpap     Patient Active Problem List   Diagnosis Date Noted  . Acute respiratory failure ([place]) 06/26/2019  . Paroxysmal atrial fibrillation ([place]) 06/13/2019  . CAP (community acquired pneumonia) 05/17/2019  . Fluid overload 06/18/2018  . Old bucket handle tear of lateral meniscus of right knee   . Old peripheral tear of medial meniscus of right knee   . Septic arthritis of knee, right ([place]) 05/06/2018  . Degenerative tear of posterior horn of  medial meniscus, right   . Old complex tear of lateral meniscus of right knee   . Loose body in knee, right   . Acute pulmonary edema ([place]) 02/21/2018  . 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[place]) 02/03/2018  . End-stage renal disease on hemodialysis ([place])   . Respiratory failure ([place]) 01/21/2018  . Achilles tendon contracture, right 11/09/2017  . Labile blood glucose   . Anemia of infection and chronic disease   . Hypertension associated with diabetes ([place])   . Black stool   . Right sided weakness   . Subdural hemorrhage following injury ([place]) 08/30/2017  . Diabetic peripheral neuropathy ([place])   . Chronic obstructive pulmonary disease ([place])   . Seizure prophylaxis   . Subdural hematoma ([place]) 08/24/2017  . Traumatic subdural hematoma ([place]) 08/23/2017  . Fall   . SDH (subdural hematoma) ([place])   . Acute respiratory failure with hypoxia ([place])   . Diabetes mellitus type 2 in nonobese (HCC)   . Leukocytosis   . Labile blood pressure   . Generalized anxiety disorder   . Debility 08/16/2017  . Amputated great toe of right foot ([place])   . Amputated  toe of left foot ([place])   . Post-operative pain   . ESRD on dialysis ([place])   . Diabetes mellitus ([place])   . Anxiety state   . Benign essential HTN   . Acute blood loss anemia   . Anemia of chronic disease   . Diarrhea, unspecified 01/16/2017  . Dependence on renal dialysis ([place]) 01/06/2017  . Idiopathic chronic venous hypertension of both lower extremities with inflammation 10/13/2016  . S/P transmetatarsal amputation of foot, left ([place]) 09/21/2016  . Onychomycosis 08/15/2016  . Age-related osteoporosis without current pathological fracture 07/11/2016  . Cervical disc disorder, unspecified, unspecified cervical region 07/11/2016  . Diverticulitis of intestine, part unspecified, without perforation or abscess without bleeding 07/11/2016  . Unspecified abdominal hernia without obstruction or gangrene 07/11/2016  . PAD  (peripheral artery disease) ([place]) 07/08/2016  . Cellulitis of left toe 06/29/2016  . Atherosclerosis of native artery of left lower extremity with gangrene ([place]) 06/23/2016  . Complication of vascular dialysis catheter 02/29/2016  . Coagulation defect, unspecified ([place]) 10/16/2015  . GERD (gastroesophageal reflux disease) 10/07/2015  . ARF (acute renal failure) ([place])   . Hypothermia 06/12/2015  . Acute on chronic renal failure ([place]) 06/12/2015  . CHF (congestive heart failure) ([place]) 07/30/2014  . CKD (chronic kidney disease) stage 4, GFR 15-29 ml/min (HCC) 06/27/2014  . Hypertensive heart disease 05/25/2013  . CKD (chronic kidney disease) stage 3, GFR 30-59 ml/min 05/25/2013  . Pulmonary edema 05/23/2013  . Type 2 diabetes mellitus with diabetic nephropathy ([place]) 05/23/2013  . Type 2 diabetes mellitus with hyperosmolar nonketotic hyperglycemia ([place]) 04/13/2013  . Acute on chronic diastolic heart failure ([place]) 04/13/2013  . UGI bleed 03/31/2013  . Hypokalemia 08/24/2012  . Type II or unspecified type diabetes mellitus without mention of complication, not stated as uncontrolled 12/27/2007  . Hyperlipidemia associated with type 2 diabetes mellitus ([place]) 12/27/2007  . Allergic rhinitis due to pollen 12/27/2007  . Asthma 12/27/2007    Past Surgical History:  Procedure Laterality Date  . A/V SHUNTOGRAM N/A 09/22/2016   Procedure: A/V Shuntogram - left arm;  Surgeon: Serafina Mitchell, MD;  Location: [facility];  Service: Cardiovascular;  Laterality: N/A;  . A/V SHUNTOGRAM N/A 03/22/2018   Procedure: A/V SHUNTOGRAM - left arm;  Surgeon: Serafina Mitchell, MD;  Location: [facility];  Service: Cardiovascular;  Laterality: N/A;  . A/V SHUNTOGRAM Left 11/17/2018   Procedure: A/V SHUNTOGRAM;  Surgeon: Angelia Mould, MD;  Location: [facility];  Service: Cardiovascular;  Laterality: Left;  . ABDOMINAL HYSTERECTOMY  1993`  . AMPUTATION Left 09/01/2016   Procedure: LEFT  FOOT TRANSMETATARSAL AMPUTATION;  Surgeon: Newt Minion, MD;  Location: [facility];  Service: Orthopedics;  Laterality: Left;  . AMPUTATION Right 08/11/2017   Procedure: RIGHT GREAT TOE AMPUTATION DIGIT;  Surgeon: Rosetta Posner, MD;  Location: [facility];  Service: Vascular;  Laterality: Right;  . AMPUTATION Right 12/31/2017   Procedure: RIGHT TRANSMETATARSAL AMPUTATION;  Surgeon: Newt Minion, MD;  Location: [facility];  Service: Orthopedics;  Laterality: Right;  . AV FISTULA PLACEMENT Left 04/21/2016   Procedure: INSERTION OF ARTERIOVENOUS (AV) GORE-TEX GRAFT ARM LEFT;  Surgeon: Elam Dutch, MD;  Location: [facility];  Service: Vascular;  Laterality: Left;  . [name] TRANSPOSITION Left 07/10/2014   Procedure: BASCILIC VEIN TRANSPOSITION;  Surgeon: Angelia Mould, MD;  Location: [facility];  Service: Vascular;  Laterality: Left;  . [place] Right 11/08/2014   Procedure: FIRST  STAGE BASILIC VEIN TRANSPOSITION;  Surgeon: Angelia Mould, MD;  Location: [facility];  Service: Vascular;  Laterality: Right;  . BASCILIC VEIN TRANSPOSITION Right 01/18/2015   Procedure: SECOND STAGE BASILIC VEIN TRANSPOSITION;  Surgeon: Angelia Mould, MD;  Location: [facility];  Service: Vascular;  Laterality: Right;  . CRANIOTOMY N/A 08/23/2017   Procedure: CRANIOTOMY HEMATOMA EVACUATION SUBDURAL;  Surgeon: Ashok Pall, MD;  Location: [place];  Service: Neurosurgery;  Laterality: N/A;  . CRANIOTOMY Left 08/24/2017   Procedure: CRANIOTOMY FOR RECURRENT ACUTE SUBDURAL HEMATOMA;  Surgeon: Ashok Pall, MD;  Location: [facility];  Service: Neurosurgery;  Laterality: Left;  . ESOPHAGOGASTRODUODENOSCOPY N/A 03/31/2013   Procedure: ESOPHAGOGASTRODUODENOSCOPY (EGD);  Surgeon: Gatha Mayer, MD;  Location[facility]OS[facility];  Service: Endoscopy;  Laterality: N/A;  . EYE SURGERY     laser surgery  . FEMORAL-POPLITEAL BYPASS GRAFT Left 07/08/2016   Procedure: LEFT  FEMORAL-BELOW KNEE POPLITEAL ARTERY BYPASS GRAFT USING 6MM X 80  CM PROPATEN GORETEX GRAFT WITH RINGS.;  Surgeon: Rosetta Posner, MD;  Location: [facility];  Service: Vascular;  Laterality: Left;  . FEMORAL-POPLITEAL BYPASS GRAFT Right 08/11/2017   Procedure: RIGHT FEMORAL TO BELOW KNEE POPLITEAL ARTERKY  BYPASS GRAFT USING 6MM RINGED PROPATEN GRAFT;  Surgeon: Rosetta Posner, MD;  Location: [facility];  Service: Vascular;  Laterality: Right;  . FISTULOGRAM Left 10/29/2014   Procedure: FISTULOGRAM;  Surgeon: Angelia Mould, MD;  Location: [facility] CATH LAB;  Service: Cardiovascular;  Laterality: Left;  Marland Kitchen GAS/FLUID EXCHANGE Left 12/20/2018   Procedure: AIR GAS EXCHANGE;  Surgeon: Jalene Mullet, MD;  Location: [facility];  Service: Ophthalmology;  Laterality: Left;  . KNEE ARTHROSCOPY Right 05/06/2018   Procedure: RIGHT KNEE ARTHROSCOPY;  Surgeon: Newt Minion, MD;  Location: [facility];  Service: Orthopedics;  Laterality: Right;  . KNEE ARTHROSCOPY Right 06/10/2018   Procedure: RIGHT KNEE ARTHROSCOPY AND DEBRIDEMENT;  Surgeon: Newt Minion, MD;  Location: [facility];  Service: Orthopedics;  Laterality: Right;  . LIGATION OF ARTERIOVENOUS  FISTULA Left 04/21/2016   Procedure: LIGATION OF ARTERIOVENOUS  FISTULA LEFT ARM;  Surgeon: Elam Dutch, MD;  Location: [facility];  Service: Vascular;  Laterality: Left;  . LOWER EXTREMITY ANGIOGRAPHY N/A 04/06/2017   Procedure: Lower Extremity Angiography - Right;  Surgeon: Serafina Mitchell, MD;  Location: [place] [facility];  Service: Cardiovascular;  Laterality: N/A;  . MEMBRANE PEEL Left 12/20/2018   Procedure: MEMBRANE PEEL;  Surgeon: Jalene Mullet, MD;  Location: [facility];  Service: Ophthalmology;  Laterality: Left;  . PATCH ANGIOPLASTY Right 01/18/2015   Procedure: BASILIC VEIN PATCH ANGIOPLASTY USING VASCUGUARD PATCH;  Surgeon: Angelia Mould, MD;  Location: [facility];  Service: Vascular;  Laterality: Right;  . PERIPHERAL VASCULAR BALLOON ANGIOPLASTY  09/22/2016   Procedure: Peripheral Vascular Balloon Angioplasty;  Surgeon: Serafina Mitchell, MD;   Location: [place] [facility];  Service: Cardiovascular;;  Lt. Fistula  . PERIPHERAL VASCULAR BALLOON ANGIOPLASTY Left 03/22/2018   Procedure: PERIPHERAL VASCULAR BALLOON ANGIOPLASTY;  Surgeon: Serafina Mitchell, MD;  Location: [place];  Service: Cardiovascular;  Laterality: Left;  Arm shunt  . PERIPHERAL VASCULAR CATHETERIZATION N/A 06/23/2016   Procedure: Abdominal Aortogram w/Lower Extremity;  Surgeon: Serafina Mitchell, MD;  Location: [place] [facility];  Service: Cardiovascular;  Laterality: N/A;  . PERIPHERAL VASCULAR CATHETERIZATION  06/23/2016   Procedure: Peripheral Vascular Intervention;  Surgeon: Serafina Mitchell, MD;  Location: [place] [facility];  Service: Cardiovascular;;  lt common and  external illiac artery  . PHOTOCOAGULATION WITH LASER Left 12/20/2018   Procedure: PHOTOCOAGULATION WITH LASER;  Surgeon: Jalene Mullet, MD;  Location: [place];  Service: Ophthalmology;  Laterality: Left;  . 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      Home Medications    Prior to Admission medications   Medication Sig Start Date End Date Taking? Authorizing Provider  albuterol [facility] HFA) 108 (90 Base) MCG/ACT inhaler 4 puffs every 4-6 hours as needed 12/07/19   Valentina Shaggy, MD  amLODipine (NORVASC) 10 MG tablet Take 10 mg by mouth at bedtime.    [provider]  aspirin EC 81 MG tablet Take 81 mg by mouth daily.    [provider]  atorvastatin (LIPITOR) 20 MG tablet TAKE 1 TABLET BY MOUTH EVERY DAY Patient taking differently: Take 20 mg by mouth daily at 6 PM.  04/11/19   Adrian Prows, MD  azelastine (ASTELIN) 0.1 % nasal spray Place 1 spray into both nostrils 2 (two) times daily. 12/07/19   Valentina Shaggy, MD  budesonide-formoterol  [facility]) 160-4.5 MCG/ACT inhaler 2 puffs twice daily with spacer 12/07/19   Valentina Shaggy, MD  busPIRone (BUSPAR) 7.5 MG tablet  10/29/19   [provider]  carvedilol (COREG) 12.5 MG tablet Take 12.5 mg by mouth See admin instructions. Take 1 tablet (12.5 mg) by mouth once daily in the evening on Mondays, Wednesdays, & Fridays. (Dialysis) Take 1 tablet (12.5 mg) by mouth twice daily on Sundays, Tuesdays, Thursdays, & Saturdays. (Non Dialysis)    [provider]  cetirizine (ZYRTEC) 10 MG tablet Take 1 tablet (10 mg total) by mouth at bedtime. 06/13/19   Spero Geralds, MD  cinacalcet (SENSIPAR) 60 MG tablet Take 90 mg by mouth See admin instructions. Take 1 tablet (90 mg) by mouth in the evening on Mondays, Wednesdays, & Fridays (dialysis) Take 1 tablet (90 mg) by mouth twice daily on Sundays, Tuesdays, Thursdays, Saturdays. (non dialysis) 11/28/18   [provider]  diazepam (VALIUM) 5 MG tablet Take 5 mg by mouth 2 (two) times daily as needed for anxiety.    [provider]  ferric citrate (AURYXIA) 1 GM 210 MG(Fe) tablet Take 210-420 mg by mouth See admin instructions. Take 2 tablets (420 mg) by mouth with each meal & take 1 tablet (210 mg) by mouth with snacks.    [provider]  fluticasone (FLONASE) 50 MCG/ACT nasal spray Place 2 sprays into both nostrils daily as needed for allergies or rhinitis. 12/07/19   Valentina Shaggy, MD  gabapentin (NEURONTIN) 100 MG capsule TAKE 1 CAPSULE (100 MG TOTAL) BY MOUTH AT BEDTIME. WHEN NECESSARY FOR NEUROPATHY PAIN 12/13/18   Newt Minion, MD  glipiZIDE (GLUCOTROL) 5 MG tablet  11/02/19   [provider]  hydrocerin (EUCERIN) CREA Apply 539 application topically 2 (two) times daily. Patient taking differently: Apply 1 application topically 2 (two) times daily.  09/16/17   Love, Ivan Anchors, PA-C  HYDROcodone-homatropine (HYCODAN) 5-1.5 MG/5ML syrup Take 5 mLs by mouth every 8 (eight) hours as needed  for cough. 05/29/19   [provider]  insulin regular (NOVOLIN R,HUMULIN R) 100 units/mL injection Inject 3-8 Units into the skin See admin instructions. 3 times daily with meals per sliding scale  except on Dialysis days, Mon, Wed, Fri    [provider]  isosorbide-hydrALAZINE (BIDIL) 20-37.5 MG tablet Take 1 tablet by mouth 3 (three) times daily. On NON dialysis days pt takes 1 tab 3 times daily, and on Dialysis days, pt takes 1 tab every evning Patient taking differently: Take 1 tablet by mouth See admin instructions. Take 1 tablet by mouth in the evening on Mondays, Wednesdays, & Fridays. (Dialysis) Take 1 tablet 3 times daily on Sundays, Tuesdays, Thursdays, & Saturdays. (Non-Dialysis) 12/06/18   Miquel Dunn, NP  lidocaine-prilocaine (EMLA) cream Apply 1 application topically 3 (three) times a week. 1-2 hours prior to dialysis 01/07/18   [provider]  montelukast (SINGULAIR) 10 MG tablet Take 1 tablet (10 mg total) by mouth at bedtime. 01/01/20   Rosemarie Ax, MD  multivitamin (RENA-VIT) TABS tablet Take 1 tablet by mouth daily.  01/24/15   [provider]  omeprazole (PRILOSEC) 20 MG capsule Take 20 mg by mouth daily.     [provider]  ondansetron (ZOFRAN) 4 MG tablet Take 1 tablet (4 mg total) by mouth every 6 (six) hours as needed for nausea. 05/22/19   Aline August, MD  senna (SENOKOT) 8.6 MG tablet Take 1 tablet by mouth as needed for constipation.    [provider]  traZODone (DESYREL) 50 MG tablet  10/29/19   [provider]  Vitamin D, Ergocalciferol, (DRISDOL) 1.25 MG (50000 UT) CAPS capsule Take 50,000 Units by mouth every Sunday. Takes on Sunday     [provider]    Family History Family History  Problem Relation Age of Onset  . Other Mother        not sure of cause of death  . Diabetes Father   . Pancreatic cancer Maternal Grandmother   . Colon cancer Neg Hx     Social History Social  History   Tobacco Use  . Smoking status: Former Smoker    Packs/day: 0.35    Years: 40.00    Pack years: 14.00    Types: Cigarettes    Quit date: 05/10/2012    Years since quitting: 7.6  . Smokeless tobacco: Never Used  Substance Use Topics  . Alcohol use: No  . Drug use: No    Comment: marijuana; quit in early 1980's     Allergies   Adhesive  [tape], Lisinopril, and Prednisone   Review of Systems Review of Systems  See HPI  Physical Exam Triage Vital Signs ED Triage Vitals  Enc Vitals Group     BP 01/01/20 1746 (!) 163/74     Pulse Rate 01/01/20 1746 82     Resp 01/01/20 1746 18     Temp 01/01/20 1746 99 F (37.2 C)     Temp Source 01/01/20 1746 Oral     SpO2 01/01/20 1746 100 %     Weight --      Height --      Head Circumference --      Peak Flow --      Pain Score 01/01/20 1747 0     Pain Loc --      Pain Edu? --      Excl. in [place]? --    No data found.  Updated Vital Signs BP (!) 163/74 (BP Location: Right Arm)   Pulse 82   Temp 99 F (37.2 C) (Oral)   Resp 18   SpO2 100%   Visual Acuity Right Eye Distance:   Left  Eye Distance:   Bilateral Distance:    Right Eye Near:   Left Eye Near:    Bilateral Near:     Physical Exam Gen: NAD, alert, cooperative with exam, well-appearing ENT: normal lips, normal nasal mucosa,  Eye: normal EOM, normal conjunctiva and lids CV:  no edema, murmur present Resp: no accessory muscle use, non-labored, clear to auscultation no crackles or wheezes    UC Treatments / Results  Labs (all labs ordered are listed, but only abnormal results are displayed) Labs Reviewed - No data to display  EKG   Radiology No results found.  Procedures Procedures (including critical care time)  Medications Ordered in UC Medications - No data to display  Initial Impression / Assessment and Plan / UC Course  I have reviewed the triage vital signs and the nursing notes.  Pertinent labs & imaging results that were  available during my care of the patient were reviewed by me and considered in my medical decision making (see chart for details).     Ms. Jake Shark is a 69 year old female is presenting with a chronic cough.  She has a history of reflux and allergies.  She has tried several medications with limited improvement.  Discussed Singulair.  She can try this if she would like.  Advised to have a discussion with her allergist as well.  Does not appear fluid overloaded.  Counseled on follow-up and give implications on returning.  Final Clinical Impressions(s) / UC Diagnoses   Final diagnoses:  Cough     Discharge Instructions     Please try honey, vick's vapor rub, lozenges and humidifer for cough  I have sent the singulair in. You can discuss this with your allergy doctor  Please follow up if your symptoms fail to improve or worsen.     ED Prescriptions    Medication Sig Dispense Auth. Provider   montelukast (SINGULAIR) 10 MG tablet Take 1 tablet (10 mg total) by mouth at bedtime. 30 tablet Rosemarie Ax, MD     PDMP not reviewed this encounter.   Rosemarie Ax, MD 01/01/20 [phone]

## 2020-01-01 NOTE — Discharge Instructions (Addendum)
Please try honey, vick's vapor rub, lozenges and humidifer for cough  I have sent the singulair in. You can discuss this with your allergy doctor  Please follow up if your symptoms fail to improve or worsen.

## 2020-01-01 NOTE — ED Triage Notes (Signed)
Pt is dialysis pt  With last treatment today c/o cough x months; denies fever

## 2020-01-02 ENCOUNTER — Other Ambulatory Visit: Payer: Self-pay | Admitting: Cardiology

## 2020-01-02 DIAGNOSIS — I5033 Acute on chronic diastolic (congestive) heart failure: Secondary | ICD-10-CM

## 2020-01-03 NOTE — Progress Notes (Addendum)
Primary Physician/Referring:  Loretha Brasil, FNP  Patient ID: Destiny Day, female    DOB: [birth year], 69 y.o.   MRN: [id number]  Chief Complaint  Patient presents with  . orthostatic hypotension    1 year  . Follow-up    pt c/o high BP   HPI:    Destiny Day  is a 69 y.o. female  Destiny Day female with difficult to control hypertension in the past, uncontrolled diabetes mellitus, hyperlipidemia and end-stage renal disease on hemodialysis since 2017, no CAD by angiography surprisingly in 2007, peripheral arterial disease with history of left iliac artery stenting and bilateral femoro-popliteal grafting,  s/p left transmetatarsal amputation on 09/01/2016, and right TMA on 12-31-17 by Dr. Sharol Given due to diabetic non healing ulceration, also has diabetic retinopathy.  She was diagnosed with Covid 19 pneumonia on 05/17/2019, fortunately recovered well. She presents here for annual visit and follow-up of hypertension.  She is made drastic lifestyle changes and since last year her blood pressure has been very well controlled, diabetes is now well controlled, she looks well and is gaining weight.  She ran out of the prescription for BiDil about a week to 10 days ago since then she has noticed her blood pressure to be uncontrolled again.  Otherwise she has had good control of blood pressure in fact has had low blood pressure during dialysis   Past Medical History:  Diagnosis Date  . Abdominal bruit   . Anemia   . Anxiety   . Arthritis    Osteoarthritis  . Asthma   . Cervical disc disease    "pinced nerve"  . CHF (congestive heart failure) ([place])   . Complication of anesthesia    " after I got home from my last procedure, I started itching."  . Diabetes mellitus    Type II  . Diverticulitis   . ESRD (end stage renal disease) ([place])    dialysis - M/W/F- [place]  . GERD (gastroesophageal reflux disease)    from medications  . GI bleed 03/31/2013  . Head  injury 07/2017  . History of hiatal hernia   . Hyperlipidemia   . Hypertension   . Neuropathy    left leg  . Osteoporosis   . Peripheral vascular disease ([place])   . Pneumonia    "very young" and a few years ago  . PONV (postoperative nausea and vomiting)   . Seasonal allergies   . Shortness of breath dyspnea    WIth exertion, when fluid builds  . Sleep apnea    can't afford cpap    Past Surgical History:  Procedure Laterality Date  . A/V SHUNTOGRAM N/A 09/22/2016   Procedure: A/V Shuntogram - left arm;  Surgeon: Serafina Mitchell, MD;  Location: [place] [facility];  Service: Cardiovascular;  Laterality: N/A;  . A/V SHUNTOGRAM N/A 03/22/2018   Procedure: A/V SHUNTOGRAM - left arm;  Surgeon: Serafina Mitchell, MD;  Location: [place] [facility];  Service: Cardiovascular;  Laterality: N/A;  . A/V SHUNTOGRAM Left 11/17/2018   Procedure: A/V SHUNTOGRAM;  Surgeon: Angelia Mould, MD;  Location: [place] [facility];  Service: Cardiovascular;  Laterality: Left;  . ABDOMINAL HYSTERECTOMY  1993`  . AMPUTATION Left 09/01/2016   Procedure: LEFT FOOT TRANSMETATARSAL AMPUTATION;  Surgeon: Newt Minion, MD;  Location: [place];  Service: Orthopedics;  Laterality: Left;  . AMPUTATION Right 08/11/2017   Procedure: RIGHT GREAT TOE AMPUTATION DIGIT;  Surgeon: Rosetta Posner, MD;  Location: [facility];  Service: Vascular;  Laterality: Right;  . AMPUTATION Right 12/31/2017   Procedure: RIGHT TRANSMETATARSAL AMPUTATION;  Surgeon: Newt Minion, MD;  Location: [place];  Service: Orthopedics;  Laterality: Right;  . AV FISTULA PLACEMENT Left 04/21/2016   Procedure: INSERTION OF ARTERIOVENOUS (AV) GORE-TEX GRAFT ARM LEFT;  Surgeon: Elam Dutch, MD;  Location: [place];  Service: Vascular;  Laterality: Left;  . [place] TRANSPOSITION Left 07/10/2014   Procedure: BASCILIC VEIN TRANSPOSITION;  Surgeon: Angelia Mould, MD;  Location: [place];  Service: Vascular;  Laterality: Left;  . BASCILIC VEIN TRANSPOSITION  Right 11/08/2014   Procedure: FIRST STAGE BASILIC VEIN TRANSPOSITION;  Surgeon: Angelia Mould, MD;  Location: [place];  Service: Vascular;  Laterality: Right;  . BASCILIC VEIN TRANSPOSITION Right 01/18/2015   Procedure: SECOND STAGE BASILIC VEIN TRANSPOSITION;  Surgeon: Angelia Mould, MD;  Location: [place];  Service: Vascular;  Laterality: Right;  . CRANIOTOMY N/A 08/23/2017   Procedure: CRANIOTOMY HEMATOMA EVACUATION SUBDURAL;  Surgeon: Ashok Pall, MD;  Location: [place];  Service: Neurosurgery;  Laterality: N/A;  . CRANIOTOMY Left 08/24/2017   Procedure: CRANIOTOMY FOR RECURRENT ACUTE SUBDURAL HEMATOMA;  Surgeon: Ashok Pall, MD;  Location: [place];  Service: Neurosurgery;  Laterality: Left;  . ESOPHAGOGASTRODUODENOSCOPY N/A 03/31/2013   Procedure: ESOPHAGOGASTRODUODENOSCOPY (EGD);  Surgeon: Gatha Mayer, MD;  Location[facility];  Service: Endoscopy;  Laterality: N/A;  . EYE SURGERY     laser surgery  . FEMORAL-POPLITEAL BYPASS GRAFT Left 07/08/2016   Procedure: LEFT  FEMORAL-BELOW KNEE POPLITEAL ARTERY BYPASS GRAFT USING 6MM X 80 CM PROPATEN GORETEX GRAFT WITH RINGS.;  Surgeon: Rosetta Posner, MD;  Location: [place];  Service: Vascular;  Laterality: Left;  . FEMORAL-POPLITEAL BYPASS GRAFT Right 08/11/2017   Procedure: RIGHT FEMORAL TO BELOW KNEE POPLITEAL ARTERKY  BYPASS GRAFT USING 6MM RINGED PROPATEN GRAFT;  Surgeon: Rosetta Posner, MD;  Location: [name];  Service: Vascular;  Laterality: Right;  . FISTULOGRAM Left 10/29/2014   Procedure: FISTULOGRAM;  Surgeon: Angelia Mould, MD;  Location: [facility];  Service: Cardiovascular;  Laterality: Left;  [place] GAS/FLUID EXCHANGE Left 12/20/2018   Procedure: AIR GAS EXCHANGE;  Surgeon: Jalene Mullet, MD;  Location: [place];  Service: Ophthalmology;  Laterality: Left;  . KNEE ARTHROSCOPY Right 05/06/2018   Procedure: RIGHT KNEE ARTHROSCOPY;  Surgeon: Newt Minion, MD;  Location: [place];  Service: Orthopedics;  Laterality: Right;  . KNEE  ARTHROSCOPY Right 06/10/2018   Procedure: RIGHT KNEE ARTHROSCOPY AND DEBRIDEMENT;  Surgeon: Newt Minion, MD;  Location: [place];  Service: Orthopedics;  Laterality: Right;  . LIGATION OF ARTERIOVENOUS  FISTULA Left 04/21/2016   Procedure: LIGATION OF ARTERIOVENOUS  FISTULA LEFT ARM;  Surgeon: Elam Dutch, MD;  Location: [place];  Service: Vascular;  Laterality: Left;  . LOWER EXTREMITY ANGIOGRAPHY N/A 04/06/2017   Procedure: Lower Extremity Angiography - Right;  Surgeon: Serafina Mitchell, MD;  Location: [place] [facility];  Service: Cardiovascular;  Laterality: N/A;  . MEMBRANE PEEL Left 12/20/2018   Procedure: MEMBRANE PEEL;  Surgeon: Jalene Mullet, MD;  Location: [place];  Service: Ophthalmology;  Laterality: Left;  . PATCH ANGIOPLASTY Right 01/18/2015   Procedure: BASILIC VEIN PATCH ANGIOPLASTY USING VASCUGUARD PATCH;  Surgeon: Angelia Mould, MD;  Location: [place];  Service: Vascular;  Laterality: Right;  . PERIPHERAL VASCULAR BALLOON ANGIOPLASTY  09/22/2016   Procedure: Peripheral Vascular Balloon Angioplasty;  Surgeon: Serafina Mitchell, MD;  [facility]  [place] [facility];  Service: Cardiovascular;;  Lt. Fistula  . PERIPHERAL VASCULAR BALLOON ANGIOPLASTY Left 03/22/2018   Procedure: PERIPHERAL VASCULAR BALLOON ANGIOPLASTY;  Surgeon: Serafina Mitchell, MD;  Location: [place] [facility];  Service: Cardiovascular;  Laterality: Left;  Arm shunt  . PERIPHERAL VASCULAR CATHETERIZATION N/A 06/23/2016   Procedure: Abdominal Aortogram w/Lower Extremity;  Surgeon: Serafina Mitchell, MD;  Location: [place] [facility];  Service: Cardiovascular;  Laterality: N/A;  . PERIPHERAL VASCULAR CATHETERIZATION  06/23/2016   Procedure: Peripheral Vascular Intervention;  Surgeon: Serafina Mitchell, MD;  Location: [place] [facility];  Service: Cardiovascular;;  lt common and external illiac artery  . PHOTOCOAGULATION WITH LASER Left 12/20/2018   Procedure: PHOTOCOAGULATION WITH LASER;  Surgeon: Jalene Mullet, MD;   Location: [place];  Service: Ophthalmology;  Laterality: Left;  . 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   Procedure: REPAIR OF COMPLEX TRACTION RETINAL DETACHMENT;  Surgeon: Jalene Mullet, MD;  Location: [place];  Service: Ophthalmology;  Laterality: Left;   Family History  Problem Relation Age of Onset  . Other Mother        not sure of cause of death  . Diabetes Father   . Pancreatic cancer Maternal Grandmother   . Diabetes Sister   . Diabetes Sister   . Colon cancer Neg Hx     Social History   Tobacco Use  . Smoking status: Former Smoker    Packs/day: 0.35    Years: 40.00    Pack years: 14.00    Types: Cigarettes    Quit date: 05/10/2012    Years since quitting: 7.6  . Smokeless tobacco: Never Used  Substance Use Topics  . Alcohol use: No   Marital Status: Married  ROS  Review of Systems  Eyes: Positive for blurred vision.  Cardiovascular: Negative for dyspnea on exertion, leg swelling and syncope.  Respiratory: Positive for cough.   Musculoskeletal: Positive for arthritis. Negative for muscle weakness.  Gastrointestinal: Negative for melena.   Objective  Blood pressure (!) 207/88, pulse 91, height 5\' 5"  (1.651 m), weight 146 lb (66.2 kg).  Vitals with BMI 01/04/2020 01/01/2020 12/07/2019  Height 5\' 5"  - 5\' 5"   Weight 146 lbs - 151 lbs  BMI 81.2 - 75.17  Systolic 001 749 449  Diastolic 88 74 82  Pulse 91 82 58     Physical Exam Constitutional:      General: She is not in acute distress.    Appearance: She is well-nourished.  Cardiovascular:     Rate and Rhythm: Normal rate and regular rhythm.     Pulses:          Carotid pulses are on the right side with bruit and on the left side with bruit.      Femoral pulses are 2+ on the right side with bruit and 2+ on the left side with bruit.      Popliteal pulses are 1+ on the right side and 1+ on the left side.     Heart sounds: No murmur heard.  No gallop.      Comments: No leg edema, no JVD.    Left arm AV fistula for dialysis noted. Pulmonary:     Effort: Pulmonary effort is normal. No accessory muscle usage.     Breath sounds: Normal breath sounds.  Abdominal:     General: Bowel sounds are normal.     Palpations: Abdomen is soft.    Laboratory examination:   Recent  Labs    05/22/19 0900 06/25/19 2047 06/26/19 0500  NA 133* 133* 135  K 3.6 3.2* 3.1*  CL 95* 92* 94*  CO2 23 26 26   GLUCOSE 102* 117* 102*  BUN 45* 31* 35*  CREATININE 7.55* 7.24* 7.65*  CALCIUM 6.9* 8.5* 8.4*  GFRNONAA 5* 5* 5*  GFRAA 6* 6* 6*   CrCl cannot be calculated (Patient's most recent lab result is older than the maximum 21 days allowed.).  CMP Latest Ref Rng & Units 06/26/2019 06/25/2019 05/22/2019  Glucose 70 - 99 mg/dL 102(H) 117(H) 102(H)  BUN 8 - 23 mg/dL 35(H) 31(H) 45(H)  Creatinine 0.44 - 1.00 mg/dL 7.65(H) 7.24(H) 7.55(H)  Sodium 135 - 145 mmol/L 135 133(L) 133(L)  Potassium 3.5 - 5.1 mmol/L 3.1(L) 3.2(L) 3.6  Chloride 98 - 111 mmol/L 94(L) 92(L) 95(L)  CO2 22 - 32 mmol/L 26 26 23   Calcium 8.9 - 10.3 mg/dL 8.4(L) 8.5(L) 6.9(L)  Total Protein 6.5 - 8.1 g/dL - - 5.8(L)  Total Bilirubin 0.3 - 1.2 mg/dL - - 0.4  Alkaline Phos 38 - 126 U/L - - 142(H)  AST 15 - 41 U/L - - 31  ALT 0 - 44 U/L - - 20   CBC Latest Ref Rng & Units 12/08/2019 06/26/2019 06/25/2019  WBC 3.4 - 10.8 x10E3/uL 6.4 7.1 7.6  Hemoglobin 11.1 - 15.9 g/dL 11.2 11.2(L) 12.5  Hematocrit 34.0 - 46.6 % 32.1(L) 34.2(L) 39.1  Platelets 150 - 450 x10E3/uL 269 294 315   HEMOGLOBIN A1C Lab Results  Component Value Date   HGBA1C 5.6 06/26/2019   MPG 114.02 06/26/2019   TSH No results for input(s): TSH in the last 8760 hours.  External labs:   12/08/2019: H/H 11.2/32. MCV 92. Platelets 269.  Lipid Panel completed 09/01/2018 Cholesterol, total 204.000 m 09/01/2018 Triglycerides 68.000 09/01/2018 HDL 78 MG/DL 09/01/2018 LDL 112.000 m 09/01/2018  Glucose Random 102.000 06/26/2019 BUN 35.000 06/26/2019 Creatinine,  Serum 7.650 06/26/2019  A1C 5.600 06/26/2019  TSH 1.240 micr 09/01/2018  FOBT: Normal 09/16/2018  MicroAlbumin Urine 500.000 01/12/2017 MicroAlbumin/Creat 244.9 MG/ 01/12/2017  Medications and allergies   Allergies  Allergen Reactions  . Adhesive  [Tape] Hives  . Lisinopril Cough  . Prednisone Swelling and Other (See Comments)    Excessive fluid buildup     Current Outpatient Medications  Medication Instructions  . albuterol (PROAIR HFA) 108 (90 Base) MCG/ACT inhaler 4 puffs every 4-6 hours as needed  . amLODipine (NORVASC) 10 mg, Oral, Daily at bedtime  . aspirin EC 81 mg, Oral, Daily  . atorvastatin (LIPITOR) 20 MG tablet TAKE 1 TABLET BY MOUTH EVERY DAY  . azelastine (ASTELIN) 0.1 % nasal spray 1 spray, Each Nare, 2 times daily  . BIDIL 20-37.5 MG tablet TAKE ONE TABLET BY MOUTH THREE TIMES A DAY   . budesonide-formoterol (SYMBICORT) 160-4.5 MCG/ACT inhaler 2 puffs twice daily with spacer  . busPIRone (BUSPAR) 7.5 mg  . carvedilol (COREG) 12.5 mg, Oral, See admin instructions, Take 1 tablet (12.5 mg) by mouth once daily in the evening on Mondays, Wednesdays, & Fridays. (Dialysis)Take 1 tablet (12.5 mg) by mouth twice daily on Sundays, Tuesdays, Thursdays, & Saturdays. (Non Dialysis)  . cinacalcet (SENSIPAR) 90 mg, Oral, See admin instructions, Take 1 tablet (90 mg) by mouth in the evening on Mondays, Wednesdays, & Fridays (dialysis)Take 1 tablet (90 mg) by mouth twice daily on Sundays, Tuesdays, Thursdays, Saturdays. (non dialysis)  . diazepam (VALIUM) 5 mg, Oral, 2 times daily PRN  . ferric citrate (  AURYXIA) 210-420 mg, Oral, See admin instructions, Take 2 tablets (420 mg) by mouth with each meal & take 1 tablet (210 mg) by mouth with snacks.  . fluticasone (FLONASE) 50 MCG/ACT nasal spray 2 sprays, Each Nare, Daily PRN  . gabapentin (NEURONTIN) 100 mg, Oral, Daily at bedtime, When necessary for neuropathy pain  . glipiZIDE (GLUCOTROL) 5 mg, Oral, Daily before breakfast  .  lidocaine-prilocaine (EMLA) cream 1 application, Topical, 3 times weekly, 1-2 hours prior to dialysis  . montelukast (SINGULAIR) 10 mg, Oral, Daily at bedtime  . multivitamin (RENA-VIT) TABS tablet 1 tablet, Oral, Daily  . omeprazole (PRILOSEC) 20 mg, Oral, Daily  . ondansetron (ZOFRAN) 4 mg, Oral, Every 6 hours PRN  . traZODone (DESYREL) 50 MG tablet No dose, route, or frequency recorded.  . Vitamin D (Ergocalciferol) (DRISDOL) 50,000 Units, Oral, Every Sun, Takes on Sunday  . Xyzal Allergy 24HR 5 mg, Oral, Daily   There are no discontinued medications.  Radiology:   No results found.  Cardiac Studies:   Echocardiogram 01/23/2018:  Normal LV systolic function, EF 27-03%, grade 2 diastolic dysfunction with elevated left atrial and LV EDP.  Moderate pulmonary hypertension with PA pressure measuring 44 mmHg.  No CAD by angiography surprisingly remotely in 2007.  Lower Extremity Arterial Duplex 01/10/2019: Right: Patent right femoral - below knee popliteal artery bypass graft without evidence of restenosis.  50-74% stenosis noted in the inflow artery.  Left: Patent right femoral - below knee popliteal artery bypass graft without evidence of restenosis.  50-74% stenosis noted in the inflow artery.   EKG  EKG 01/06/2020: Normal sinus rhythm with rate of 85 beats minute, normal axis.  Nonspecific ST-T abnormality, consider LVH with repolarization versus high lateral ischemia.  No significant change from 01/05/2019.  Assessment     ICD-10-CM   1. PAOD (peripheral arterial occlusive disease) (HCC)  I77.9 Lipid Panel With LDL/HDL Ratio  2. Hypertensive heart disease with heart failure (HCC)  I11.0 EKG 12-Lead  3. Chronic diastolic heart failure (HCC)  I50.32   4. ESRD on dialysis ([place])  N18.6    Z99.2   5. Hypercholesteremia  E78.00 Lipid Panel With LDL/HDL Ratio    Recommendations:   RHIAN ASEBEDO  is a 69 y.o. female  Destiny Day female with  difficult to control hypertension in the past, uncontrolled diabetes mellitus, hyperlipidemia and end-stage renal disease on hemodialysis since 2017, no CAD by angiography surprisingly in 2007, peripheral arterial disease with history of left iliac artery stenting and bilateral femoro-popliteal grafting,  s/p left transmetatarsal amputation on 09/01/2016, and right TMA on 12-31-17 by Dr. Sharol Given due to diabetic non healing ulceration, also has diabetic retinopathy.  She is here on annual visit, since she is out of BiDil for the past 10 days, blood pressure has been elevated.  I have given her samples of the same for now.  She is waiting for mail order prescriptions to arrive.  Blood pressures well controlled on current has occasionally been low during dialysis.  She has responded well to BiDil.  No clinical evidence of heart failure.  Vascular disease is being managed by Dr. Sherren Mocha early, we requested him to also reevaluate carotid duplex as it has not been done in quite a while and she has fairly prominent carotid bruit.  I will check lipid profile testing today and will be aggressive with lipid management with goal LDL less than <70.  Office visit in 1 year or sooner if problems.  Add: Increase Atorvastatin to 40 mg. Can F/U with PCP for recheck and add Zetia 10 mg if LDL >70.  Adrian Prows, MD, [facility] 01/05/2020, 6:57 AM [facility] Pager: [phone] Office: [phone]

## 2020-01-04 ENCOUNTER — Encounter: Payer: Self-pay | Admitting: Cardiology

## 2020-01-04 ENCOUNTER — Ambulatory Visit: Payer: Medicare Other | Admitting: Cardiology

## 2020-01-04 ENCOUNTER — Telehealth: Payer: Self-pay

## 2020-01-04 ENCOUNTER — Other Ambulatory Visit: Payer: Self-pay

## 2020-01-04 VITALS — BP 207/88 | HR 91 | Ht 65.0 in | Wt 146.0 lb

## 2020-01-04 DIAGNOSIS — N186 End stage renal disease: Secondary | ICD-10-CM | POA: Diagnosis not present

## 2020-01-04 DIAGNOSIS — I779 Disorder of arteries and arterioles, unspecified: Secondary | ICD-10-CM | POA: Diagnosis not present

## 2020-01-04 DIAGNOSIS — I110 Hypertensive heart disease with heart failure: Secondary | ICD-10-CM

## 2020-01-04 DIAGNOSIS — E7800 Pure hypercholesterolemia, unspecified: Secondary | ICD-10-CM

## 2020-01-04 DIAGNOSIS — Z992 Dependence on renal dialysis: Secondary | ICD-10-CM | POA: Diagnosis not present

## 2020-01-04 DIAGNOSIS — I5032 Chronic diastolic (congestive) heart failure: Secondary | ICD-10-CM | POA: Diagnosis not present

## 2020-01-04 NOTE — Telephone Encounter (Signed)
Spoke to pt about her upcoming studies and appt with Dr. Donnetta Hutching next month. She is aware of them and has no further questions/concerns at this time.

## 2020-01-05 LAB — LIPID PANEL WITH LDL/HDL RATIO
Cholesterol, Total: 204 mg/dL — ABNORMAL HIGH (ref 100–199)
HDL: 78 mg/dL (ref >39)
LDL Chol Calc (NIH): 107 mg/dL — ABNORMAL HIGH (ref 0–99)
LDL/HDL Ratio: 1.4 ratio (ref 0.0–3.2)
Triglycerides: 111 mg/dL (ref 0–149)
VLDL Cholesterol Cal: 19 mg/dL (ref 5–40)

## 2020-01-05 MED ORDER — ATORVASTATIN CALCIUM 40 MG PO TABS: 40.0000 mg | ORAL_TABLET | Freq: Every day | ORAL | 3 refills | Status: DC

## 2020-01-05 NOTE — Addendum Note (Signed)
Addended by: Kela Millin on: 01/05/2020 07:01 AM   Modules accepted: Orders

## 2020-01-09 ENCOUNTER — Encounter: Payer: Self-pay | Admitting: Allergy & Immunology

## 2020-01-09 ENCOUNTER — Ambulatory Visit (INDEPENDENT_AMBULATORY_CARE_PROVIDER_SITE_OTHER): Payer: Medicare Other | Admitting: Allergy & Immunology

## 2020-01-09 ENCOUNTER — Other Ambulatory Visit: Payer: Self-pay

## 2020-01-09 VITALS — BP 124/82 | HR 86 | Temp 98.3°F | Resp 16

## 2020-01-09 DIAGNOSIS — J302 Other seasonal allergic rhinitis: Secondary | ICD-10-CM | POA: Diagnosis not present

## 2020-01-09 DIAGNOSIS — I779 Disorder of arteries and arterioles, unspecified: Secondary | ICD-10-CM

## 2020-01-09 DIAGNOSIS — J4540 Moderate persistent asthma, uncomplicated: Secondary | ICD-10-CM

## 2020-01-09 DIAGNOSIS — J3089 Other allergic rhinitis: Secondary | ICD-10-CM

## 2020-01-09 NOTE — Patient Instructions (Addendum)
1. Moderate persistent asthma, uncomplicated - Lung testing was in the 50-60% range today. - It was slightly lower than last time.  - Spacer sample and demonstration provided. - Daily controller medication(s): Singulair 10mg  daily only - Prior to physical activity: albuterol 2 puffs 10-15 minutes before physical activity. - Rescue medications: albuterol 4 puffs every 4-6 hours as needed - Asthma control goals:  * Full participation in all desired activities (may need albuterol before activity) * Albuterol use two time or less a week on average (not counting use with activity) * Cough interfering with sleep two time or less a month * Oral steroids no more than once a year * No hospitalizations  2. Chronic rhinitis (dust mites, cat, dog, grass, and indoor and outdoor molds) - We did intradermal testing that demonstrated sensitization to additional outdoor molds as well as cat.  - Copy of results provided. - Continue with Singulair 10mg  daily. - Continue with Flonase one spray per nostril twice daily and Astelin one spray per nostril twice daily.  - Allergy shot consent provided. - CPT codes provided.   3. Return in about 3 months (around 04/10/2020). This can be an in-person, a virtual Webex or a telephone follow up visit.   Please inform us of any Emergency Department visits, hospitalizations, or changes in symptoms. Call us before going to the ED for breathing or allergy symptoms since we might be able to fit you in for a sick visit. Feel free to contact us anytime with any questions, problems, or concerns.  It was a pleasure to see you again today!  Websites that have reliable patient information: 1. American Academy of Asthma, Allergy, and Immunology: www.aaaai.org 2. Food Allergy Research and Education (FARE): foodallergy.org 3. Mothers of Asthmatics: http://www.asthmacommunitynetwork.org 4. American College of Allergy, Asthma, and Immunology: www.acaai.org   COVID-19 Vaccine  Information can be found at: [email or web address].[email or web address].uk For questions related to vaccine distribution or appointments, please email [email or web address] or call [phone].     "Like" [place] on Facebook and Instagram for our latest updates!       HAPPY SPRING!  Make sure you are registered to vote! If you have moved or changed any of your contact information, you will need to get this updated before voting!  In some cases, you MAY be able to register to vote online: [email or web address]    Reducing Pollen Exposure  The American Academy of Allergy, Asthma and Immunology suggests the following steps to reduce your exposure to pollen during allergy seasons.    1. Do not hang sheets or clothing out to dry; pollen may collect on these items. 2. Do not mow lawns or spend time around freshly cut grass; mowing stirs up pollen. 3. Keep windows closed at night.  Keep car windows closed while driving. 4. Minimize morning activities outdoors, a time when pollen counts are usually at their highest. 5. Stay indoors as much as possible when pollen counts or humidity is high and on windy days when pollen tends to remain in the air longer. 6. Use air conditioning when possible.  Many air conditioners have filters that trap the pollen spores. 7. Use a HEPA room air filter to remove pollen form the indoor air you breathe.  Control of Mold Allergen   Mold and fungi can grow on a variety of surfaces provided certain temperature and moisture conditions exist.  Outdoor molds grow on plants, decaying vegetation and soil.  The major outdoor mold, Alternaria and Cladosporium, are found in very high numbers during  hot and dry conditions.  Generally, a late Summer - Fall peak is seen for common outdoor fungal spores.  Rain will temporarily lower outdoor mold spore count, but counts rise rapidly when the rainy period ends.  The most  important indoor molds are Aspergillus and Penicillium.  Dark, humid and poorly ventilated basements are ideal sites for mold growth.  The next most common sites of mold growth are the bathroom and the kitchen.  Outdoor (Seasonal) Mold Control   1. Use air conditioning and keep windows closed 2. Avoid exposure to decaying vegetation. 3. Avoid leaf raking. 4. Avoid grain handling. 5. Consider wearing a face mask if working in moldy areas.    Indoor (Perennial) Mold Control    1. Maintain humidity below 50%. 2. Clean washable surfaces with 5% bleach solution. 3. Remove sources e.g. contaminated carpets.     Control of Dust Mite Allergen    Dust mites play a major role in allergic asthma and rhinitis.  They occur in environments with high humidity wherever human skin is found.  Dust mites absorb humidity from the atmosphere (ie, they do not drink) and feed on organic matter (including shed human and animal skin).  Dust mites are a microscopic type of insect that you cannot see with the naked eye.  High levels of dust mites have been detected from mattresses, pillows, carpets, upholstered furniture, bed covers, clothes, soft toys and any woven material.  The principal allergen of the dust mite is found in its feces.  A gram of dust may contain 1,000 mites and 250,000 fecal particles.  Mite antigen is easily measured in the air during house cleaning activities.  Dust mites do not bite and do not cause harm to humans, other than by triggering allergies/asthma.    Ways to decrease your exposure to dust mites in your home:  1. Encase mattresses, box springs and pillows with a mite-impermeable barrier or cover   2. Wash sheets, blankets and drapes weekly in hot water (130 F) with detergent and dry them in a dryer on the hot setting.  3. Have the room cleaned frequently with a vacuum cleaner and a damp dust-mop.  For carpeting or rugs, vacuuming with a vacuum cleaner equipped with a  high-efficiency particulate air (HEPA) filter.  The dust mite allergic individual should not be in a room which is being cleaned and should wait 1 hour after cleaning before going into the room. 4. Do not sleep on upholstered furniture (eg, couches).   5. If possible removing carpeting, upholstered furniture and drapery from the home is ideal.  Horizontal blinds should be eliminated in the rooms where the person spends the most time (bedroom, study, television room).  Washable vinyl, roller-type shades are optimal. 6. Remove all non-washable stuffed toys from the bedroom.  Wash stuffed toys weekly like sheets and blankets above.   7. Reduce indoor humidity to less than 50%.  Inexpensive humidity monitors can be purchased at most hardware stores.  Do not use a humidifier as can make the problem worse and are not recommended.  Control of Dog or Cat Allergen  Avoidance is the best way to manage a dog or cat allergy. If you have a dog or cat and are allergic to dog or cats, consider removing the dog or cat from the home. If you have a dog or cat but don't want to find it a new home, or if your family wants a pet even though someone in the household is  allergic, here are some strategies that may help keep symptoms at bay:  1. Keep the pet out of your bedroom and restrict it to only a few rooms. Be advised that keeping the dog or cat in only one room will not limit the allergens to that room. 2. Don't pet, hug or kiss the dog or cat; if you do, wash your hands with soap and water. 3. High-efficiency particulate air (HEPA) cleaners run continuously in a bedroom or living room can reduce allergen levels over time. 4. Regular use of a high-efficiency vacuum cleaner or a central vacuum can reduce allergen levels. 5. Giving your dog or cat a bath at least once a week can reduce airborne allergen.  Allergy Shots   Allergies are the result of a chain reaction that starts in the immune system. Your immune  system controls how your body defends itself. For instance, if you have an allergy to pollen, your immune system identifies pollen as an invader or allergen. Your immune system overreacts by producing antibodies called Immunoglobulin E (IgE). These antibodies travel to cells that release chemicals, causing an allergic reaction.  The concept behind allergy immunotherapy, whether it is received in the form of shots or tablets, is that the immune system can be desensitized to specific allergens that trigger allergy symptoms. Although it requires time and patience, the payback can be long-term relief.  How Do Allergy Shots Work?  Allergy shots work much like a vaccine. Your body responds to injected amounts of a particular allergen given in increasing doses, eventually developing a resistance and tolerance to it. Allergy shots can lead to decreased, minimal or no allergy symptoms.  There generally are two phases: build-up and maintenance. Build-up often ranges from three to six months and involves receiving injections with increasing amounts of the allergens. The shots are typically given once or twice a week, though more rapid build-up schedules are sometimes used.  The maintenance phase begins when the most effective dose is reached. This dose is different for each person, depending on how allergic you are and your response to the build-up injections. Once the maintenance dose is reached, there are longer periods between injections, typically two to four weeks.  Occasionally doctors give cortisone-type shots that can temporarily reduce allergy symptoms. These types of shots are different and should not be confused with allergy immunotherapy shots.  Who Can Be Treated with Allergy Shots?  Allergy shots may be a good treatment approach for people with allergic rhinitis (hay fever), allergic asthma, conjunctivitis (eye allergy) or stinging insect allergy.   Before deciding to begin allergy shots, you  should consider:  . The length of allergy season and the severity of your symptoms . Whether medications and/or changes to your environment can control your symptoms . Your desire to avoid long-term medication use . Time: allergy immunotherapy requires a major time commitment . Cost: may vary depending on your insurance coverage  Allergy shots for children age 85 and older are effective and often well tolerated. They might prevent the onset of new allergen sensitivities or the progression to asthma.  Allergy shots are not started on patients who are pregnant but can be continued on patients who become pregnant while receiving them. In some patients with other medical conditions or who take certain common medications, allergy shots may be of risk. It is important to mention other medications you talk to your allergist.   When Will I Feel Better?  Some may experience decreased allergy symptoms during the  build-up phase. For others, it may take as long as 12 months on the maintenance dose. If there is no improvement after a year of maintenance, your allergist will discuss other treatment options with you.  If you aren't responding to allergy shots, it may be because there is not enough dose of the allergen in your vaccine or there are missing allergens that were not identified during your allergy testing. Other reasons could be that there are high levels of the allergen in your environment or major exposure to non-allergic triggers like tobacco smoke.  What Is the Length of Treatment?  Once the maintenance dose is reached, allergy shots are generally continued for three to five years. The decision to stop should be discussed with your allergist at that time. Some people may experience a permanent reduction of allergy symptoms. Others may relapse and a longer course of allergy shots can be considered.  What Are the Possible Reactions?  The two types of adverse reactions that can occur with  allergy shots are local and systemic. Common local reactions include very mild redness and swelling at the injection site, which can happen immediately or several hours after. A systemic reaction, which is less common, affects the entire body or a particular body system. They are usually mild and typically respond quickly to medications. Signs include increased allergy symptoms such as sneezing, a stuffy nose or hives.  Rarely, a serious systemic reaction called anaphylaxis can develop. Symptoms include swelling in the throat, wheezing, a feeling of tightness in the chest, nausea or dizziness. Most serious systemic reactions develop within 30 minutes of allergy shots. This is why it is strongly recommended you wait in your doctor's office for 30 minutes after your injections. Your allergist is trained to watch for reactions, and his or her staff is trained and equipped with the proper medications to identify and treat them.  Who Should Administer Allergy Shots?  The preferred location for receiving shots is your prescribing allergist's office. Injections can sometimes be given at another facility where the physician and staff are trained to recognize and treat reactions, and have received instructions by your prescribing allergist.

## 2020-01-09 NOTE — Progress Notes (Signed)
FOLLOW UP  Date of Service/Encounter:  01/09/20   Assessment:   Moderate persistent asthma, uncomplicated   Chronic rhinitis (dust mites, cat, dog, grass, and indoor and outdoor molds)  Elevated eosinophils - likely uncontrolled allergies  Complicated past medical history, including kidney disease on dialysis  Plan/Recommendations:   1. Moderate persistent asthma, uncomplicated - Lung testing was in the 50-60% range today. - It was slightly lower than last time.  - Spacer sample and demonstration provided. - Daily controller medication(s): Singulair 10mg  daily only - Prior to physical activity: albuterol 2 puffs 10-15 minutes before physical activity. - Rescue medications: albuterol 4 puffs every 4-6 hours as needed - Asthma control goals:  * Full participation in all desired activities (may need albuterol before activity) * Albuterol use two time or less a week on average (not counting use with activity) * Cough interfering with sleep two time or less a month * Oral steroids no more than once a year * No hospitalizations  2. Chronic rhinitis (dust mites, cat, dog, grass, and indoor and outdoor molds) - We did intradermal testing that demonstrated sensitization to additional outdoor molds as well as cat.  - Copy of results provided. - Continue with Singulair 10mg  daily. - Continue with Flonase one spray per nostril twice daily and Astelin one spray per nostril twice daily.  - Allergy shot consent provided. - CPT codes provided.   3. Return in about 3 months (around 04/10/2020). This can be an in-person, a virtual Webex or a telephone follow up visit.   Subjective:   Destiny Day is a 69 y.o. female presenting today for follow up of  Chief Complaint  Patient presents with  . Asthma  . Chronic Rhinitis    Destiny Day has a history of the following: Patient Active Problem List   Diagnosis Date Noted  . Acute respiratory failure ([place]) 06/26/2019  .  Paroxysmal atrial fibrillation ([place]) 06/13/2019  . CAP (community acquired pneumonia) 05/17/2019  . Fluid overload 06/18/2018  . Old bucket handle tear of lateral meniscus of right knee   . Old peripheral tear of medial meniscus of right knee   . Septic arthritis of knee, right ([place]) 05/06/2018  . Degenerative tear of posterior horn of medial meniscus, right   . Old complex tear of lateral meniscus of right knee   . Loose body in knee, right   . Acute pulmonary edema ([place]) 02/21/2018  . 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 End-stage renal disease on hemodialysis ([place])   . Respiratory failure ([place]) 01/21/2018  . Achilles tendon contracture, right 11/09/2017  . Labile blood glucose   . Anemia of infection and chronic disease   . Hypertension associated with diabetes ([place])   . Black stool   . Right sided weakness   . Subdural hemorrhage following injury ([place]) 08/30/2017  . Diabetic peripheral neuropathy ([place])   . Chronic obstructive pulmonary disease ([place])   . Seizure prophylaxis   . Subdural hematoma ([place]) 08/24/2017  . Traumatic subdural hematoma ([place]) 08/23/2017  . Fall   . SDH (subdural hematoma) ([place])   . Acute respiratory failure with hypoxia ([place])   . Diabetes mellitus type 2 in nonobese (HCC)   . Leukocytosis   . Labile blood pressure   . Generalized anxiety disorder   . Debility 08/16/2017  . Amputated great toe of right foot ([place])   . Amputated toe of left foot ([place])   . Post-operative pain   .  ESRD on dialysis ([place])   . Diabetes mellitus ([place])   . Anxiety state   . Benign essential HTN   . Acute blood loss anemia   . Anemia of chronic disease   . Diarrhea, unspecified 01/16/2017  . Dependence on renal dialysis ([place]) 01/06/2017  . Idiopathic chronic venous hypertension of both lower extremities with inflammation 10/13/2016  . S/P transmetatarsal amputation of foot, left ([place]) 09/21/2016  . Onychomycosis 08/15/2016  . Age-related  osteoporosis without current pathological fracture 07/11/2016  . Cervical disc disorder, unspecified, unspecified cervical region 07/11/2016  . Diverticulitis of intestine, part unspecified, without perforation or abscess without bleeding 07/11/2016  . Unspecified abdominal hernia without obstruction or gangrene 07/11/2016  . PAD (peripheral artery disease) ([place]) 07/08/2016  . Cellulitis of left toe 06/29/2016  . Atherosclerosis of native artery of left lower extremity with gangrene ([place]) 06/23/2016  . Complication of vascular dialysis catheter 02/29/2016  . Coagulation defect, unspecified ([place]) 10/16/2015  . GERD (gastroesophageal reflux disease) 10/07/2015  . ARF (acute renal failure) ([place])   . Hypothermia 06/12/2015  . Acute on chronic renal failure ([place]) 06/12/2015  . CHF (congestive heart failure) ([place]) 07/30/2014  . CKD (chronic kidney disease) stage 4, GFR 15-29 ml/min (HCC) 06/27/2014  . Hypertensive heart disease 05/25/2013  . CKD (chronic kidney disease) stage 3, GFR 30-59 ml/min 05/25/2013  . Pulmonary edema 05/23/2013  . Type 2 diabetes mellitus with diabetic nephropathy ([place]) 05/23/2013  . Type 2 diabetes mellitus with hyperosmolar nonketotic hyperglycemia ([place]) 04/13/2013  . Acute on chronic diastolic heart failure ([place]) 04/13/2013  . UGI bleed 03/31/2013  . Hypokalemia 08/24/2012  . Type II or unspecified type diabetes mellitus without mention of complication, not stated as uncontrolled 12/27/2007  . Hyperlipidemia associated with type 2 diabetes mellitus ([place]) 12/27/2007  . Allergic rhinitis due to pollen 12/27/2007  . Asthma 12/27/2007    History obtained from: chart review and patient.  Destiny Day is a 69 y.o. female presenting for a follow up visit.  She was last seen in May 2021.  At that time, her testing for her lungs was in the 50 to 60% range.  There was not improvement with the Xopenex.  We decided to start her on a controller medication (Symbicort 160/4.5 mcg 2  puffs twice daily).  For her rhinitis, we decided to get blood work instead of skin testing since she had taken her antihistamine so recently.  We started Xyzal in lieu of Zyrtec and continued with Flonase.  We also added on Astelin.  Labs demonstrated positive results to the mold panel.  She was also positive to dust mites, dog, grasses, and additional molds.  Today, she presents having not taken her antihistamine.  Asthma/Respiratory Symptom History: The Symbicort unfortunately caused her quite a bit of money.  She reports that it was $300 for her inhaler.  She did pay for it and has been using it sparingly.  However, in the interim, she was started on the Singulair at urgent care and this seems to have helped her breathing as well.  She has not been needing her rescue inhaler much at all.  Allergic Rhinitis Symptom History: Her smyptoms remain uncontrolled even with the medications.  She went to urgent care and was started on the Singulair instead of the Xyzal.  She continues with the Flonase.  While the Singulair has helped, she does not feel that it completely resolved it.  She is open to allergen immunotherapy.  She has been on allergen immunotherapy in  the past.  Otherwise, there have been no changes to her past medical history, surgical history, family history, or social history.    Review of Systems  Constitutional: Negative.  Negative for chills, fever, malaise/fatigue and weight loss.  HENT: Positive for congestion and sinus pain. Negative for ear discharge and ear pain.        Positive for postnasal drip.  Positive for throat clearing.  Eyes: Negative for pain, discharge and redness.  Respiratory: Negative for cough, sputum production, shortness of breath and wheezing.   Cardiovascular: Negative.  Negative for chest pain and palpitations.  Gastrointestinal: Negative for abdominal pain, constipation, diarrhea, heartburn, nausea and vomiting.  Skin: Negative.  Negative for itching and  rash.  Neurological: Negative for dizziness and headaches.  Endo/Heme/Allergies: Positive for environmental allergies. Does not bruise/bleed easily.       Objective:   Blood pressure 124/82, pulse 86, temperature 98.3 F (36.8 C), temperature source Temporal, resp. rate 16, SpO2 100 %. There is no height or weight on file to calculate BMI.   Physical Exam:  Physical Exam  Constitutional: She appears well-developed.  HENT:  Head: Normocephalic and atraumatic.  Right Ear: Tympanic membrane, external ear and ear canal normal.  Left Ear: Tympanic membrane and ear canal normal.  Nose: No mucosal edema, rhinorrhea, nasal deformity or septal deviation. Right sinus exhibits no maxillary sinus tenderness and no frontal sinus tenderness. Left sinus exhibits no maxillary sinus tenderness and no frontal sinus tenderness.  Mouth/Throat: Uvula is midline. Mucous membranes are not pale and not dry.  Eyes: Pupils are equal, round, and reactive to light. Conjunctivae are normal. Right eye exhibits no chemosis and no discharge. Left eye exhibits no chemosis and no discharge. Right conjunctiva is not injected. Left conjunctiva is not injected.  Cardiovascular: Normal rate, regular rhythm and normal heart sounds.  Respiratory: Effort normal and breath sounds normal. No accessory muscle usage. No tachypnea. No respiratory distress. She has no wheezes. She has no rhonchi. She has no rales. She exhibits no tenderness.  GI: Normal appearance.  Lymphadenopathy:    She has no cervical adenopathy.  Neurological: She is alert.  Skin: No abrasion, no petechiae and no rash noted. Rash is not papular, not vesicular and not urticarial. No erythema. No pallor.     Diagnostic studies:    Spirometry: results abnormal (FEV1: 0.96/48%, FVC: 1.70/67%, FEV1/FVC: 56%).    Spirometry consistent with mixed obstructive and restrictive disease.  Overall, values are stable compared to that obtained at the last  visit.  Allergy Studies:     Intradermal - 01/09/20 1505    Time Antigen Placed 1505    Allergen Manufacturer Lavella Hammock    Location Arm    Number of Test 10    Intradermal Select    Control Negative    [place] Negative    Johnson Negative    Ragweed mix Negative    Weed mix Negative    Tree mix Negative    Mold 3 3+    Mold 4 Negative    Cat 3+    Cockroach Negative           Allergy testing results were read and interpreted by myself, documented by clinical staff.      Salvatore Marvel, MD  [facility]

## 2020-01-10 ENCOUNTER — Encounter: Payer: Self-pay | Admitting: Allergy & Immunology

## 2020-01-10 NOTE — Progress Notes (Signed)
EXP 01/14/21

## 2020-01-15 DIAGNOSIS — J3081 Allergic rhinitis due to animal (cat) (dog) hair and dander: Secondary | ICD-10-CM | POA: Diagnosis not present

## 2020-01-16 ENCOUNTER — Other Ambulatory Visit: Payer: Self-pay | Admitting: Allergy & Immunology

## 2020-01-16 DIAGNOSIS — J3089 Other allergic rhinitis: Secondary | ICD-10-CM | POA: Diagnosis not present

## 2020-01-16 MED ORDER — EPINEPHRINE 0.3 MG/0.3ML IJ SOAJ: 0.3000 mg | Freq: Once | INTRAMUSCULAR | 1 refills | Status: AC

## 2020-01-16 MED ORDER — BENZONATATE 100 MG PO CAPS: 100.0000 mg | ORAL_CAPSULE | Freq: Three times a day (TID) | ORAL | 0 refills | Status: DC | PRN

## 2020-01-16 NOTE — Telephone Encounter (Signed)
Patient is calling and stated she is having running nose and post nasal drip. Advise patient that Azelastine nasal spray was [facility]. Patient stated they did not have it will call pharmacy to confirm. Epi-pen was not called in for patient pending approval. Patient is requesting something for her cough since she experiencing a lot of cough.

## 2020-01-16 NOTE — Telephone Encounter (Signed)
I am sending in a script for Tessalon pearls. Confirmed that this is not renally excreted, so she should be OK.  Salvatore Marvel, MD [facility]

## 2020-01-16 NOTE — Telephone Encounter (Signed)
Patient called and said that her epi-pen was not a her pharmacy she needs it call into [facility]. [phone]

## 2020-01-17 NOTE — Telephone Encounter (Signed)
Informed pt of this rx and she stated understanding

## 2020-01-24 ENCOUNTER — Other Ambulatory Visit: Payer: Self-pay | Admitting: *Deleted

## 2020-01-24 DIAGNOSIS — I7025 Atherosclerosis of native arteries of other extremities with ulceration: Secondary | ICD-10-CM

## 2020-01-24 DIAGNOSIS — Z95828 Presence of other vascular implants and grafts: Secondary | ICD-10-CM

## 2020-01-24 DIAGNOSIS — I739 Peripheral vascular disease, unspecified: Secondary | ICD-10-CM

## 2020-01-24 DIAGNOSIS — R0989 Other specified symptoms and signs involving the circulatory and respiratory systems: Secondary | ICD-10-CM

## 2020-01-25 DIAGNOSIS — N186 End stage renal disease: Secondary | ICD-10-CM | POA: Diagnosis not present

## 2020-01-25 DIAGNOSIS — I159 Secondary hypertension, unspecified: Secondary | ICD-10-CM | POA: Diagnosis not present

## 2020-01-25 DIAGNOSIS — Z992 Dependence on renal dialysis: Secondary | ICD-10-CM | POA: Diagnosis not present

## 2020-01-26 DIAGNOSIS — N2581 Secondary hyperparathyroidism of renal origin: Secondary | ICD-10-CM | POA: Diagnosis not present

## 2020-01-26 DIAGNOSIS — N186 End stage renal disease: Secondary | ICD-10-CM | POA: Diagnosis not present

## 2020-01-26 DIAGNOSIS — E876 Hypokalemia: Secondary | ICD-10-CM | POA: Diagnosis not present

## 2020-01-26 DIAGNOSIS — Z992 Dependence on renal dialysis: Secondary | ICD-10-CM | POA: Diagnosis not present

## 2020-01-29 DIAGNOSIS — N186 End stage renal disease: Secondary | ICD-10-CM | POA: Diagnosis not present

## 2020-01-29 DIAGNOSIS — E876 Hypokalemia: Secondary | ICD-10-CM | POA: Diagnosis not present

## 2020-01-29 DIAGNOSIS — N2581 Secondary hyperparathyroidism of renal origin: Secondary | ICD-10-CM | POA: Diagnosis not present

## 2020-01-29 DIAGNOSIS — Z992 Dependence on renal dialysis: Secondary | ICD-10-CM | POA: Diagnosis not present

## 2020-01-30 ENCOUNTER — Ambulatory Visit (INDEPENDENT_AMBULATORY_CARE_PROVIDER_SITE_OTHER): Payer: Medicare Other

## 2020-01-30 ENCOUNTER — Other Ambulatory Visit: Payer: Self-pay

## 2020-01-30 DIAGNOSIS — Z992 Dependence on renal dialysis: Secondary | ICD-10-CM | POA: Diagnosis not present

## 2020-01-30 DIAGNOSIS — J3089 Other allergic rhinitis: Secondary | ICD-10-CM

## 2020-01-30 DIAGNOSIS — J309 Allergic rhinitis, unspecified: Secondary | ICD-10-CM

## 2020-01-30 DIAGNOSIS — J302 Other seasonal allergic rhinitis: Secondary | ICD-10-CM

## 2020-01-30 DIAGNOSIS — I70209 Unspecified atherosclerosis of native arteries of extremities, unspecified extremity: Secondary | ICD-10-CM | POA: Diagnosis not present

## 2020-01-30 DIAGNOSIS — N186 End stage renal disease: Secondary | ICD-10-CM | POA: Diagnosis not present

## 2020-01-30 DIAGNOSIS — E1122 Type 2 diabetes mellitus with diabetic chronic kidney disease: Secondary | ICD-10-CM | POA: Diagnosis not present

## 2020-01-30 DIAGNOSIS — Z008 Encounter for other general examination: Secondary | ICD-10-CM | POA: Diagnosis not present

## 2020-01-30 DIAGNOSIS — Z794 Long term (current) use of insulin: Secondary | ICD-10-CM | POA: Diagnosis not present

## 2020-01-30 MED ORDER — EPINEPHRINE 0.3 MG/0.3ML IJ SOAJ: 0.3000 mg | Freq: Once | INTRAMUSCULAR | 1 refills | Status: AC

## 2020-01-30 NOTE — Progress Notes (Signed)
Immunotherapy   Patient Details  Name: Destiny Day MRN: [id number] Date of Birth: [birth year]  01/30/2020  [place] started injections for  Blue vials G-D-C given in the right arm and M-DM given in the left arm. Patient was advise to wait 30 mins in the exam room. Patient tolerated injection well with out any issues after injection.  Following schedule: B  Frequency:1 time per week Epi-Pen:Epi-Pen Available  Consent signed and patient instructions given.   Festus Holts Diogenes Whirley 01/30/2020, 2:04 PM

## 2020-01-31 DIAGNOSIS — E876 Hypokalemia: Secondary | ICD-10-CM | POA: Diagnosis not present

## 2020-01-31 DIAGNOSIS — E1122 Type 2 diabetes mellitus with diabetic chronic kidney disease: Secondary | ICD-10-CM | POA: Diagnosis not present

## 2020-01-31 DIAGNOSIS — N2581 Secondary hyperparathyroidism of renal origin: Secondary | ICD-10-CM | POA: Diagnosis not present

## 2020-01-31 DIAGNOSIS — N186 End stage renal disease: Secondary | ICD-10-CM | POA: Diagnosis not present

## 2020-01-31 DIAGNOSIS — Z992 Dependence on renal dialysis: Secondary | ICD-10-CM | POA: Diagnosis not present

## 2020-02-02 DIAGNOSIS — N2581 Secondary hyperparathyroidism of renal origin: Secondary | ICD-10-CM | POA: Diagnosis not present

## 2020-02-02 DIAGNOSIS — Z992 Dependence on renal dialysis: Secondary | ICD-10-CM | POA: Diagnosis not present

## 2020-02-02 DIAGNOSIS — E876 Hypokalemia: Secondary | ICD-10-CM | POA: Diagnosis not present

## 2020-02-02 DIAGNOSIS — N186 End stage renal disease: Secondary | ICD-10-CM | POA: Diagnosis not present

## 2020-02-04 ENCOUNTER — Other Ambulatory Visit: Payer: Self-pay

## 2020-02-04 ENCOUNTER — Ambulatory Visit (INDEPENDENT_AMBULATORY_CARE_PROVIDER_SITE_OTHER): Payer: Medicare Other

## 2020-02-04 ENCOUNTER — Ambulatory Visit (HOSPITAL_COMMUNITY)
Admission: EM | Admit: 2020-02-04 | Discharge: 2020-02-04 | Disposition: A | Discharge status: Home / Self Care | Payer: Medicare Other | Attending: Emergency Medicine | Admitting: Emergency Medicine

## 2020-02-04 DIAGNOSIS — R1011 Right upper quadrant pain: Secondary | ICD-10-CM

## 2020-02-04 MED ORDER — DOCUSATE SODIUM 100 MG PO CAPS
100.0000 mg | ORAL_CAPSULE | Freq: Two times a day (BID) | ORAL | 0 refills | Status: DC
Start: 2020-02-04 — End: 2021-08-04

## 2020-02-04 MED ORDER — POLYETHYLENE GLYCOL 3350 17 G PO PACK: 17.0000 g | PACK | Freq: Every day | ORAL | 0 refills | Status: DC

## 2020-02-04 NOTE — ED Provider Notes (Signed)
[facility]    CSN: [id number] Arrival date & time: 02/04/20  1300      History   Chief Complaint Chief Complaint  Patient presents with  . Abdominal Pain    HPI Destiny Day is a 69 y.o. female history of DM type II, ESRD, GERD, peripheral vascular disease, CHF, presenting today for evaluation of abdominal pain.  Patient reports that over the past week she has had pain in her right upper abdomen which wraps around to her flank and right lower/mid back.  Has had some occasional nausea with this.  Initially thought pain was related to a strained muscle, but since symptoms have been persistent for a week she is concerned about other causes.  She has been eating and drinking normally without worsening pain.  Denies diarrhea.  Does report her last bowel movement was approximately 2 weeks ago.  At that time she had drank some magnesium citrate.  Has had more issues with constipation since being older.  Denies prior abdominal surgeries.  Denies injury or trauma to side.  Denies prior smoking or tobacco use.  Denies alcohol use.  Does take occasional Tylenol.  Last A1c was 6.2.  HPI  Past Medical History:  Diagnosis Date  . Abdominal bruit   . Anemia   . Anxiety   . Arthritis    Osteoarthritis  . Asthma   . Cervical disc disease    "pinced nerve"  . CHF (congestive heart failure) ([place])   . Complication of anesthesia    " after I got home from my last procedure, I started itching."  . Diabetes mellitus    Type II  . Diverticulitis   . ESRD (end stage renal disease) ([place])    dialysis - M/W/F- [place]  . GERD (gastroesophageal reflux disease)    from medications  . GI bleed 03/31/2013  . Head injury 07/2017  . History of hiatal hernia   . Hyperlipidemia   . Hypertension   . Neuropathy    left leg  . Osteoporosis   . Peripheral vascular disease ([place])   . Pneumonia    "very young" and a few years ago  . PONV (postoperative nausea and vomiting)   . Seasonal  allergies   . Shortness of breath dyspnea    WIth exertion, when fluid builds  . Sleep apnea    can't afford cpap     Patient Active Problem List   Diagnosis Date Noted  . Acute respiratory failure ([place]) 06/26/2019  . Paroxysmal atrial fibrillation ([place]) 06/13/2019  . CAP (community acquired pneumonia) 05/17/2019  . Fluid overload 06/18/2018  . Old bucket handle tear of lateral meniscus of right knee   . Old peripheral tear of medial meniscus of right knee   . Septic arthritis of knee, right ([place]) 05/06/2018  . Degenerative tear of posterior horn of medial meniscus, right   . Old complex tear of lateral meniscus of right knee   . Loose body in knee, right   . Acute pulmonary edema ([place]) 02/21/2018  . 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[place]) 02/03/2018  . End-stage renal disease on hemodialysis ([place])   . Respiratory failure ([place]) 01/21/2018  . Achilles tendon contracture, right 11/09/2017  . Labile blood glucose   . Anemia of infection and chronic disease   . Hypertension associated with diabetes ([place])   . Black stool   . Right sided weakness   . Subdural hemorrhage following injury ([place]) 08/30/2017  . Diabetic peripheral  neuropathy ([place])   . Chronic obstructive pulmonary disease ([place])   . Seizure prophylaxis   . Subdural hematoma ([place]) 08/24/2017  . Traumatic subdural hematoma ([place]) 08/23/2017  . Fall   . SDH (subdural hematoma) ([place])   . Acute respiratory failure with hypoxia ([place])   . Diabetes mellitus type 2 in nonobese (HCC)   . Leukocytosis   . Labile blood pressure   . Generalized anxiety disorder   . Debility 08/16/2017  . Amputated great toe of right foot ([place])   . Amputated toe of left foot ([place])   . Post-operative pain   . ESRD on dialysis ([place])   . Diabetes mellitus ([place])   . Anxiety state   . Benign essential HTN   . Acute blood loss anemia   . Anemia of chronic disease   . Diarrhea, unspecified 01/16/2017  . Dependence on renal  dialysis ([place]) 01/06/2017  . Idiopathic chronic venous hypertension of both lower extremities with inflammation 10/13/2016  . S/P transmetatarsal amputation of foot, left ([place]) 09/21/2016  . Onychomycosis 08/15/2016  . Age-related osteoporosis without current pathological fracture 07/11/2016  . Cervical disc disorder, unspecified, unspecified cervical region 07/11/2016  . Diverticulitis of intestine, part unspecified, without perforation or abscess without bleeding 07/11/2016  . Unspecified abdominal hernia without obstruction or gangrene 07/11/2016  . PAD (peripheral artery disease) ([place]) 07/08/2016  . Cellulitis of left toe 06/29/2016  . Atherosclerosis of native artery of left lower extremity with gangrene ([place]) 06/23/2016  . Complication of vascular dialysis catheter 02/29/2016  . Coagulation defect, unspecified ([place]) 10/16/2015  . GERD (gastroesophageal reflux disease) 10/07/2015  . ARF (acute renal failure) ([place])   . Hypothermia 06/12/2015  . Acute on chronic renal failure ([place]) 06/12/2015  . CHF (congestive heart failure) ([place]) 07/30/2014  . CKD (chronic kidney disease) stage 4, GFR 15-29 ml/min (HCC) 06/27/2014  . Hypertensive heart disease 05/25/2013  . CKD (chronic kidney disease) stage 3, GFR 30-59 ml/min 05/25/2013  . Pulmonary edema 05/23/2013  . Type 2 diabetes mellitus with diabetic nephropathy ([place]) 05/23/2013  . Type 2 diabetes mellitus with hyperosmolar nonketotic hyperglycemia ([place]) 04/13/2013  . Acute on chronic diastolic heart failure ([place]) 04/13/2013  . UGI bleed 03/31/2013  . Hypokalemia 08/24/2012  . Type II or unspecified type diabetes mellitus without mention of complication, not stated as uncontrolled 12/27/2007  . Hyperlipidemia associated with type 2 diabetes mellitus ([employer]) 12/27/2007  . Allergic rhinitis due to pollen 12/27/2007  . Asthma 12/27/2007    Past Surgical History:  Procedure Laterality Date  . A/V SHUNTOGRAM N/A 09/22/2016   Procedure: A/V  Shuntogram - left arm;  Surgeon: Serafina Mitchell, MD;  Location: [facility];  Service: Cardiovascular;  Laterality: N/A;  . A/V SHUNTOGRAM N/A 03/22/2018   Procedure: A/V SHUNTOGRAM - left arm;  Surgeon: Serafina Mitchell, MD;  Location: [facility];  Service: Cardiovascular;  Laterality: N/A;  . A/V SHUNTOGRAM Left 11/17/2018   Procedure: A/V SHUNTOGRAM;  Surgeon: Angelia Mould, MD;  Location: [facility];  Service: Cardiovascular;  Laterality: Left;  . ABDOMINAL HYSTERECTOMY  1993`  . AMPUTATION Left 09/01/2016   Procedure: LEFT FOOT TRANSMETATARSAL AMPUTATION;  Surgeon: Newt Minion, MD;  Location: [facility];  Service: Orthopedics;  Laterality: Left;  . AMPUTATION Right 08/11/2017   Procedure: RIGHT GREAT TOE AMPUTATION DIGIT;  Surgeon: Rosetta Posner, MD;  [facility]: [facility];  Service: Vascular;  Laterality: Right;  . AMPUTATION Right 12/31/2017   Procedure: RIGHT  TRANSMETATARSAL AMPUTATION;  Surgeon: Newt Minion, MD;  Location: [facility];  Service: Orthopedics;  Laterality: Right;  . AV FISTULA PLACEMENT Left 04/21/2016   Procedure: INSERTION OF ARTERIOVENOUS (AV) GORE-TEX GRAFT ARM LEFT;  Surgeon: Elam Dutch, MD;  Location: [facility];  Service: Vascular;  Laterality: Left;  . [place] TRANSPOSITION Left 07/10/2014   Procedure: BASCILIC VEIN TRANSPOSITION;  Surgeon: Angelia Mould, MD;  Location: [facility];  Service: Vascular;  Laterality: Left;  . BASCILIC VEIN TRANSPOSITION Right 11/08/2014   Procedure: FIRST STAGE BASILIC VEIN TRANSPOSITION;  Surgeon: Angelia Mould, MD;  Location: [facility];  Service: Vascular;  Laterality: Right;  . BASCILIC VEIN TRANSPOSITION Right 01/18/2015   Procedure: SECOND STAGE BASILIC VEIN TRANSPOSITION;  Surgeon: Angelia Mould, MD;  Location: [facility] [place] [facility];  Service: Vascular;  Laterality: Right;  . CRANIOTOMY N/A 08/23/2017   Procedure: CRANIOTOMY HEMATOMA EVACUATION SUBDURAL;  Surgeon: Ashok Pall, MD;  Location: [facility];  Service:  Neurosurgery;  Laterality: N/A;  . CRANIOTOMY Left 08/24/2017   Procedure: CRANIOTOMY FOR RECURRENT ACUTE SUBDURAL HEMATOMA;  Surgeon: Ashok Pall, MD;  Location: [facility];  Service: Neurosurgery;  Laterality: Left;  . ESOPHAGOGASTRODUODENOSCOPY N/A 03/31/2013   Procedure: ESOPHAGOGASTRODUODENOSCOPY (EGD);  Surgeon: Gatha Mayer, MD;  [facility];  Service: Endoscopy;  Laterality: N/A;  . EYE SURGERY     laser surgery  . FEMORAL-POPLITEAL BYPASS GRAFT Left 07/08/2016   Procedure: LEFT  FEMORAL-BELOW KNEE POPLITEAL ARTERY BYPASS GRAFT USING 6MM X 80 CM PROPATEN GORETEX GRAFT WITH RINGS.;  Surgeon: Rosetta Posner, MD;  Location: [facility];  Service: Vascular;  Laterality: Left;  . FEMORAL-POPLITEAL BYPASS GRAFT Right 08/11/2017   Procedure: RIGHT FEMORAL TO BELOW KNEE POPLITEAL ARTERKY  BYPASS GRAFT USING 6MM RINGED PROPATEN GRAFT;  Surgeon: Rosetta Posner, MD;  Location: [facility];  Service: Vascular;  Laterality: Right;  . FISTULOGRAM Left 10/29/2014   Procedure: FISTULOGRAM;  Surgeon: Angelia Mould, MD;  Location[facility] C[facility] LAB;  Service: Cardiovascular;  Laterality: Left;  Marland Kitchen GAS/FLUID EXCHANGE Left 12/20/2018   Procedure: AIR GAS EXCHANGE;  Surgeon: Jalene Mullet, MD;  Location: [facility];  Service: Ophthalmology;  Laterality: Left;  . KNEE ARTHROSCOPY Right 05/06/2018   Procedure: RIGHT KNEE ARTHROSCOPY;  Surgeon: Newt Minion, MD;  Location: [facility];  Service: Orthopedics;  Laterality: Right;  . KNEE ARTHROSCOPY Right 06/10/2018   Procedure: RIGHT KNEE ARTHROSCOPY AND DEBRIDEMENT;  Surgeon: Newt Minion, MD;  Location: [facility];  Service: Orthopedics;  Laterality: Right;  . LIGATION OF ARTERIOVENOUS  FISTULA Left 04/21/2016   Procedure: LIGATION OF ARTERIOVENOUS  FISTULA LEFT ARM;  Surgeon: Elam Dutch, MD;  Location: [facility];  Service: Vascular;  Laterality: Left;  . LOWER EXTREMITY ANGIOGRAPHY N/A 04/06/2017   Procedure: Lower Extremity Angiography - Right;  Surgeon: Serafina Mitchell, MD;   Location: [facility];  Service: Cardiovascular;  Laterality: N/A;  . MEMBRANE PEEL Left 12/20/2018   Procedure: MEMBRANE PEEL;  Surgeon: Jalene Mullet, MD;  Location: [facility];  Service: Ophthalmology;  Laterality: Left;  . PATCH ANGIOPLASTY Right 01/18/2015   Procedure: BASILIC VEIN PATCH ANGIOPLASTY USING VASCUGUARD PATCH;  Surgeon: Angelia Mould, MD;  Location: [facility];  Service: Vascular;  Laterality: Right;  . PERIPHERAL VASCULAR BALLOON ANGIOPLASTY  09/22/2016   Procedure: Peripheral Vascular Balloon Angioplasty;  Surgeon: Serafina Mitchell, MD;  Location: [facility];  Service: Cardiovascular;;  Lt. Fistula  . PERIPHERAL VASCULAR BALLOON ANGIOPLASTY Left 03/22/2018  Procedure: PERIPHERAL VASCULAR BALLOON ANGIOPLASTY;  Surgeon: Serafina Mitchell, MD;  Location: [place] [facility];  Service: Cardiovascular;  Laterality: Left;  Arm shunt  . PERIPHERAL VASCULAR CATHETERIZATION N/A 06/23/2016   Procedure: Abdominal Aortogram w/Lower Extremity;  Surgeon: Serafina Mitchell, MD;  Location: [facility];  Service: Cardiovascular;  Laterality: N/A;  . PERIPHERAL VASCULAR CATHETERIZATION  06/23/2016   Procedure: Peripheral Vascular Intervention;  Surgeon: Serafina Mitchell, MD;  Location: [facility];  Service: Cardiovascular;;  lt common and external illiac artery  . PHOTOCOAGULATION WITH LASER Left 12/20/2018   Procedure: PHOTOCOAGULATION WITH LASER;  Surgeon: Jalene Mullet, MD;  Location: [facility];  Service: Ophthalmology;  Laterality: Left;  . 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      Home Medications    Prior to Admission medications   Medication Sig Start Date End Date Taking? Authorizing Provider  albuterol [facility] HFA) 108 (90 Base) MCG/ACT  inhaler 4 puffs every 4-6 hours as needed 12/07/19   Valentina Shaggy, MD  amLODipine (NORVASC) 10 MG tablet Take 10 mg by mouth at bedtime.    [provider]  aspirin EC 81 MG tablet Take 81 mg by mouth daily.    [provider]  atorvastatin (LIPITOR) 40 MG tablet Take 1 tablet (40 mg total) by mouth daily. 01/05/20   Adrian Prows, MD  azelastine (ASTELIN) 0.1 % nasal spray Place 1 spray into both nostrils 2 (two) times daily. 12/07/19   Valentina Shaggy, MD  benzonatate (TESSALON PERLES) 100 MG capsule Take 1 capsule (100 mg total) by mouth 3 (three) times daily as needed for cough. 01/16/20   Valentina Shaggy, MD  BIDIL 20-37.5 MG tablet TAKE ONE TABLET BY MOUTH THREE TIMES A DAY  01/02/20   Adrian Prows, MD  budesonide-formoterol [facility] 160-4.5 MCG/ACT inhaler 2 puffs twice daily with spacer 12/07/19   Valentina Shaggy, MD  busPIRone (BUSPAR) 7.5 MG tablet 7.5 mg.  10/29/19   [provider]  carvedilol (COREG) 12.5 MG tablet Take 12.5 mg by mouth See admin instructions. Take 1 tablet (12.5 mg) by mouth once daily in the evening on Mondays, Wednesdays, & Fridays. (Dialysis) Take 1 tablet (12.5 mg) by mouth twice daily on Sundays, Tuesdays, Thursdays, & Saturdays. (Non Dialysis)    [provider]  cinacalcet (SENSIPAR) 60 MG tablet Take 90 mg by mouth See admin instructions. Take 1 tablet (90 mg) by mouth in the evening on Mondays, Wednesdays, & Fridays (dialysis) Take 1 tablet (90 mg) by mouth twice daily on Sundays, Tuesdays, Thursdays, Saturdays. (non dialysis) 11/28/18   [provider]  diazepam (VALIUM) 5 MG tablet Take 5 mg by mouth 2 (two) times daily as needed for anxiety.    [provider]  docusate sodium (COLACE) 100 MG capsule Take 1 capsule (100 mg total) by mouth 2 (two) times daily. 02/04/20   [place] Boyett C, PA-C  ferric citrate (AURYXIA) 1 GM 210 MG(Fe) tablet Take 210-420 mg by mouth See admin instructions.  Take 2 tablets (420 mg) by mouth with each meal & take 1 tablet (210 mg) by mouth with snacks.    [provider]  fluticasone (FLONASE) 50 MCG/ACT nasal spray Place 2 sprays into both nostrils daily as  needed for allergies or rhinitis. 12/07/19   Valentina Shaggy, MD  gabapentin (NEURONTIN) 100 MG capsule TAKE 1 CAPSULE (100 MG TOTAL) BY MOUTH AT BEDTIME. WHEN NECESSARY FOR NEUROPATHY PAIN 12/13/18   Newt Minion, MD  glipiZIDE (GLUCOTROL) 5 MG tablet Take 5 mg by mouth daily before breakfast.    [provider]  lidocaine-prilocaine (EMLA) cream Apply 1 application topically 3 (three) times a week. 1-2 hours prior to dialysis 01/07/18   [provider]  montelukast (SINGULAIR) 10 MG tablet Take 1 tablet (10 mg total) by mouth at bedtime. 01/01/20   Rosemarie Ax, MD  multivitamin (RENA-VIT) TABS tablet Take 1 tablet by mouth daily.  01/24/15   [provider]  omeprazole (PRILOSEC) 20 MG capsule Take 20 mg by mouth daily.     [provider]  ondansetron (ZOFRAN) 4 MG tablet Take 1 tablet (4 mg total) by mouth every 6 (six) hours as needed for nausea. 05/22/19   Aline August, MD  polyethylene glycol (MIRALAX / GLYCOLAX) 17 g packet Take 17 g by mouth daily. 02/04/20   Remi Rester C, PA-C  traZODone (DESYREL) 50 MG tablet  10/29/19   [provider]  Vitamin D, Ergocalciferol, (DRISDOL) 1.25 MG (50000 UT) CAPS capsule Take 50,000 Units by mouth every Sunday. Takes on Sunday     [provider]  XYZAL ALLERGY 24HR 5 MG tablet Take 5 mg by mouth daily. 12/20/19   [provider]    Family History Family History  Problem Relation Age of Onset  . Other Mother        not sure of cause of death  . Diabetes Father   . Pancreatic cancer Maternal Grandmother   . Diabetes Sister   . Diabetes Sister   . Colon cancer Neg Hx     Social History Social History   Tobacco Use  . Smoking status: Former Smoker    Packs/day:  0.35    Years: 40.00    Pack years: 14.00    Types: Cigarettes    Quit date: 05/10/2012    Years since quitting: 7.7  . Smokeless tobacco: Never Used  Vaping Use  . Vaping Use: Never used  Substance Use Topics  . Alcohol use: No  . Drug use: No    Comment: marijuana; quit in early 1980's     Allergies   Adhesive  [tape], Lisinopril, and Prednisone   Review of Systems Review of Systems  Constitutional: Negative for fatigue and fever.  HENT: Negative for congestion, sinus pressure and sore throat.   Eyes: Negative for photophobia, pain and visual disturbance.  Respiratory: Negative for cough and shortness of breath.   Cardiovascular: Negative for chest pain.  Gastrointestinal: Positive for abdominal pain, constipation and nausea. Negative for vomiting.  Genitourinary: Negative for decreased urine volume and hematuria.  Musculoskeletal: Negative for myalgias, neck pain and neck stiffness.  Neurological: Negative for dizziness, syncope, facial asymmetry, speech difficulty, weakness, light-headedness, numbness and headaches.     Physical Exam Triage Vital Signs ED Triage Vitals  Enc Vitals Group     BP 02/04/20 1312 (!) 169/49     Pulse Rate 02/04/20 1312 78     Resp 02/04/20 1312 16     Temp 02/04/20 1312 98 F (36.7 C)     Temp Source 02/04/20 1312 Oral     SpO2 02/04/20 1312 100 %     Weight --      Height --  Head Circumference --      Peak Flow --      Pain Score 02/04/20 1311 10     Pain Loc --      Pain Edu? --      Excl. in [place]? --    No data found.  Updated Vital Signs BP (!) 169/49 (BP Location: Right Arm)   Pulse 78   Temp 98 F (36.7 C) (Oral)   Resp 16   SpO2 100%   Visual Acuity Right Eye Distance:   Left Eye Distance:   Bilateral Distance:    Right Eye Near:   Left Eye Near:    Bilateral Near:     Physical Exam Vitals and nursing note reviewed.  Constitutional:      Appearance: She is well-developed.     Comments: No acute  distress  HENT:     Head: Normocephalic and atraumatic.     Nose: Nose normal.  Eyes:     Conjunctiva/sclera: Conjunctivae normal.  Cardiovascular:     Rate and Rhythm: Normal rate.  Pulmonary:     Effort: Pulmonary effort is normal. No respiratory distress.     Comments: Breathing comfortably at rest, CTABL, no wheezing, rales or other adventitious sounds auscultated Abdominal:     General: There is no distension.     Comments: Abdomen soft, nondistended, tender to palpation to right lower rib cage extending into right upper quadrant and mid upper abdomen, tenderness extends around flank into lower thoracic area Negative rebound, negative Rovsing, negative Murphy's, negative McBurney's  Musculoskeletal:        General: Normal range of motion.     Cervical back: Neck supple.     Comments: Foot braces present on shoes  Skin:    General: Skin is warm and dry.  Neurological:     Mental Status: She is alert and oriented to person, place, and time.      UC Treatments / Results  Labs (all labs ordered are listed, but only abnormal results are displayed) Labs Reviewed - No data to display  EKG   Radiology DG Abd 2 Views  Result Date: 02/04/2020 CLINICAL DATA:  Right upper abdominal pain EXAM: ABDOMEN - 2 VIEW COMPARISON:  08/25/2017 FINDINGS: Visualized lung bases clear. No free air. Stomach and small bowel decompressed. Moderate colonic fecal material without dilatation. Extensive aortoiliac arterial calcifications. Regional bones unremarkable. IMPRESSION: Nonobstructive bowel gas pattern with moderate colonic fecal material. Electronically Signed   By: Lucrezia Europe M.D.   On: 02/04/2020 15:03    Procedures Procedures (including critical care time)  Medications Ordered in UC Medications - No data to display  Initial Impression / Assessment and Plan / UC Course  I have reviewed the triage vital signs and the nursing notes.  Pertinent labs & imaging results that were available  during my care of the patient were reviewed by me and considered in my medical decision making (see chart for details).     X-ray negative for obstruction, moderate stool burden.  Unable to obtain blood work for basic abdominal labs to evaluate liver/pancreas/gallbladder.  At this time feel constipation highly suspicious of cause of discomfort given without sufficient bowel movement in 2 weeks.  Discussed using MiraLAX along with Colace, discussed activity modification.  Patient has to limit or fluid intake to 32 ounces.  May continue Tylenol.  Advised to follow-up with PCP if symptoms persisting for further blood work/imaging of right upper quadrant.  Advised to follow-up in emergency room if  symptoms progressing or worsening.  Discussed strict return precautions. Patient verbalized understanding and is agreeable with plan.  Final Clinical Impressions(s) / UC Diagnoses   Final diagnoses:  Right upper quadrant abdominal pain     Discharge Instructions     No signs of obstruction on Xray  Please use Miralax for moderate to severe constipation. Take this once a day for the next 2-3 days. Please also start docusate stool softener, twice a day for at least 1 week. If stools become loose, cut down to once a day for another week. If stools remain loose, cut back to 1 pill every other day for a third week. You can stop docusate thereafter and resume as needed for constipation.  To help reduce constipation and promote bowel health: 1. Drink at least 64 ounces of water each day 2. Eat plenty of fiber (fruits, vegetables, whole grains, legumes) 3. Be physically active or exercise including walking, jogging, swimming, yoga, etc. 4. For active constipation use a stool softener (docusate) or an osmotic laxative (like Miralax) each day, or as needed.  Follow up in emergency room if pain not improving or worsening    ED Prescriptions    Medication Sig Dispense Auth. Provider   polyethylene glycol  (MIRALAX / GLYCOLAX) 17 g packet Take 17 g by mouth daily. 14 each Terelle Dobler C, PA-C   docusate sodium (COLACE) 100 MG capsule Take 1 capsule (100 mg total) by mouth 2 (two) times daily. 30 capsule Mckaela Howley, [place] C, PA-C     PDMP not reviewed this encounter.   Janith Lima, [place] 02/04/20 1616

## 2020-02-04 NOTE — Discharge Instructions (Addendum)
No signs of obstruction on Xray  Please use Miralax for moderate to severe constipation. Take this once a day for the next 2-3 days. Please also start docusate stool softener, twice a day for at least 1 week. If stools become loose, cut down to once a day for another week. If stools remain loose, cut back to 1 pill every other day for a third week. You can stop docusate thereafter and resume as needed for constipation.  To help reduce constipation and promote bowel health: 1. Drink at least 64 ounces of water each day 2. Eat plenty of fiber (fruits, vegetables, whole grains, legumes) 3. Be physically active or exercise including walking, jogging, swimming, yoga, etc. 4. For active constipation use a stool softener (docusate) or an osmotic laxative (like Miralax) each day, or as needed.  Follow up in emergency room if pain not improving or worsening

## 2020-02-04 NOTE — ED Triage Notes (Signed)
Pt present right side abdominal pain. Symptoms started over a week ago.

## 2020-02-05 DIAGNOSIS — E876 Hypokalemia: Secondary | ICD-10-CM | POA: Diagnosis not present

## 2020-02-05 DIAGNOSIS — N186 End stage renal disease: Secondary | ICD-10-CM | POA: Diagnosis not present

## 2020-02-05 DIAGNOSIS — Z992 Dependence on renal dialysis: Secondary | ICD-10-CM | POA: Diagnosis not present

## 2020-02-05 DIAGNOSIS — N2581 Secondary hyperparathyroidism of renal origin: Secondary | ICD-10-CM | POA: Diagnosis not present

## 2020-02-06 ENCOUNTER — Other Ambulatory Visit: Payer: Self-pay

## 2020-02-06 ENCOUNTER — Ambulatory Visit (HOSPITAL_COMMUNITY)
Admission: RE | Admit: 2020-02-06 | Discharge: 2020-02-06 | Disposition: A | Discharge status: Home / Self Care | Payer: Medicare Other | Source: Ambulatory Visit | Attending: Vascular Surgery | Admitting: Vascular Surgery

## 2020-02-06 ENCOUNTER — Ambulatory Visit (INDEPENDENT_AMBULATORY_CARE_PROVIDER_SITE_OTHER)
Admission: RE | Admit: 2020-02-06 | Discharge: 2020-02-06 | Disposition: A | Discharge status: Home / Self Care | Payer: Medicare Other | Source: Ambulatory Visit | Attending: Vascular Surgery | Admitting: Vascular Surgery

## 2020-02-06 ENCOUNTER — Encounter: Payer: Self-pay | Admitting: Vascular Surgery

## 2020-02-06 ENCOUNTER — Ambulatory Visit (INDEPENDENT_AMBULATORY_CARE_PROVIDER_SITE_OTHER): Payer: Medicare Other

## 2020-02-06 ENCOUNTER — Ambulatory Visit (INDEPENDENT_AMBULATORY_CARE_PROVIDER_SITE_OTHER): Payer: Medicare Other | Admitting: Vascular Surgery

## 2020-02-06 VITALS — BP 157/74 | HR 88 | Temp 97.2°F | Resp 20 | Ht 65.0 in | Wt 148.0 lb

## 2020-02-06 DIAGNOSIS — J309 Allergic rhinitis, unspecified: Secondary | ICD-10-CM | POA: Diagnosis not present

## 2020-02-06 DIAGNOSIS — J302 Other seasonal allergic rhinitis: Secondary | ICD-10-CM

## 2020-02-06 DIAGNOSIS — I7025 Atherosclerosis of native arteries of other extremities with ulceration: Secondary | ICD-10-CM

## 2020-02-06 DIAGNOSIS — R0989 Other specified symptoms and signs involving the circulatory and respiratory systems: Secondary | ICD-10-CM

## 2020-02-06 DIAGNOSIS — I779 Disorder of arteries and arterioles, unspecified: Secondary | ICD-10-CM

## 2020-02-06 DIAGNOSIS — Z95828 Presence of other vascular implants and grafts: Secondary | ICD-10-CM | POA: Diagnosis not present

## 2020-02-06 DIAGNOSIS — I739 Peripheral vascular disease, unspecified: Secondary | ICD-10-CM

## 2020-02-06 DIAGNOSIS — N186 End stage renal disease: Secondary | ICD-10-CM | POA: Diagnosis not present

## 2020-02-06 DIAGNOSIS — Z992 Dependence on renal dialysis: Secondary | ICD-10-CM

## 2020-02-06 NOTE — Progress Notes (Signed)
[facility] [place]  Patient name: Destiny Day MRN: [id number] DOB: 14-Apr-1951 Sex: female  REASON FOR VISIT: Follow-up bilateral lower extremity progressive occlusive disease and end-stage renal disease  HPI: Destiny Day is a 69 y.o. female here today for follow-up.  She looks quite good today.  She is walking with use of a walker.  She does have bilateral cock-up splints for her bilateral transmetatarsal amputations.  She reports that she is doing well with dialysis on her left upper arm AV Gore-Tex graft.   Past Medical History:  Diagnosis Date  . Abdominal bruit   . Anemia   . Anxiety   . Arthritis    Osteoarthritis  . Asthma   . Cervical disc disease    "pinced nerve"  . CHF (congestive heart failure) ([place])   . Complication of anesthesia    " after I got home from my last procedure, I started itching."  . Diabetes mellitus    Type II  . Diverticulitis   . ESRD (end stage renal disease) ([place])    dialysis - M/W/F- [place]  . GERD (gastroesophageal reflux disease)    from medications  . GI bleed 03/31/2013  . Head injury 07/2017  . History of hiatal hernia   . Hyperlipidemia   . Hypertension   . Neuropathy    left leg  . Osteoporosis   . Peripheral vascular disease ([place])   . Pneumonia    "very young" and a few years ago  . PONV (postoperative nausea and vomiting)   . Seasonal allergies   . Shortness of breath dyspnea    WIth exertion, when fluid builds  . Sleep apnea    can't afford cpap     Family History  Problem Relation Age of Onset  . Other Mother        not sure of cause of death  . Diabetes Father   . Pancreatic cancer Maternal Grandmother   . Diabetes Sister   . Diabetes Sister   . Colon cancer Neg Hx     SOCIAL HISTORY: Social History   Tobacco Use  . Smoking status: Former Smoker    Packs/day: 0.35    Years: 40.00    Pack years: 14.00    Types: Cigarettes    Quit  date: 05/10/2012    Years since quitting: 7.7  . Smokeless tobacco: Never Used  Substance Use Topics  . Alcohol use: No    Allergies  Allergen Reactions  . Adhesive  [Tape] Hives  . Lisinopril Cough  . Prednisone Swelling and Other (See Comments)    Excessive fluid buildup    Current Outpatient Medications  Medication Sig Dispense Refill  . albuterol (PROAIR HFA) 108 (90 Base) MCG/ACT inhaler 4 puffs every 4-6 hours as needed 18 g 1  . amLODipine (NORVASC) 10 MG tablet Take 10 mg by mouth at bedtime.    Marland Kitchen aspirin EC 81 MG tablet Take 81 mg by mouth daily.    Marland Kitchen atorvastatin (LIPITOR) 40 MG tablet Take 1 tablet (40 mg total) by mouth daily. 90 tablet 3  . azelastine (ASTELIN) 0.1 % nasal spray Place 1 spray into both nostrils 2 (two) times daily. 30 mL 5  . benzonatate (TESSALON PERLES) 100 MG capsule Take 1 capsule (100 mg total) by mouth 3 (three) times daily as needed for cough. 20 capsule 0  . BIDIL 20-37.5 MG tablet TAKE ONE TABLET BY MOUTH THREE TIMES A DAY  90 tablet  1  . budesonide-formoterol (SYMBICORT) 160-4.5 MCG/ACT inhaler 2 puffs twice daily with spacer 10.2 g 5  . busPIRone (BUSPAR) 7.5 MG tablet 7.5 mg.     . carvedilol (COREG) 12.5 MG tablet Take 12.5 mg by mouth See admin instructions. Take 1 tablet (12.5 mg) by mouth once daily in the evening on Mondays, Wednesdays, & Fridays. (Dialysis) Take 1 tablet (12.5 mg) by mouth twice daily on Sundays, Tuesdays, Thursdays, & Saturdays. (Non Dialysis)    . cinacalcet (SENSIPAR) 60 MG tablet Take 90 mg by mouth See admin instructions. Take 1 tablet (90 mg) by mouth in the evening on Mondays, Wednesdays, & Fridays (dialysis) Take 1 tablet (90 mg) by mouth twice daily on Sundays, Tuesdays, Thursdays, Saturdays. (non dialysis)    . diazepam (VALIUM) 5 MG tablet Take 5 mg by mouth 2 (two) times daily as needed for anxiety.    . docusate sodium (COLACE) 100 MG capsule Take 1 capsule (100 mg total) by mouth 2 (two) times daily. 30  capsule 0  . ferric citrate (AURYXIA) 1 GM 210 MG(Fe) tablet Take 210-420 mg by mouth See admin instructions. Take 2 tablets (420 mg) by mouth with each meal & take 1 tablet (210 mg) by mouth with snacks.    . fluticasone (FLONASE) 50 MCG/ACT nasal spray Place 2 sprays into both nostrils daily as needed for allergies or rhinitis. 16 g 5  . gabapentin (NEURONTIN) 100 MG capsule TAKE 1 CAPSULE (100 MG TOTAL) BY MOUTH AT BEDTIME. WHEN NECESSARY FOR NEUROPATHY PAIN 30 capsule 3  . glipiZIDE (GLUCOTROL) 5 MG tablet Take 5 mg by mouth daily before breakfast.    . lidocaine-prilocaine (EMLA) cream Apply 1 application topically 3 (three) times a week. 1-2 hours prior to dialysis  12  . montelukast (SINGULAIR) 10 MG tablet Take 1 tablet (10 mg total) by mouth at bedtime. 30 tablet 0  . multivitamin (RENA-VIT) TABS tablet Take 1 tablet by mouth daily.   5  . omeprazole (PRILOSEC) 20 MG capsule Take 20 mg by mouth daily.     . ondansetron (ZOFRAN) 4 MG tablet Take 1 tablet (4 mg total) by mouth every 6 (six) hours as needed for nausea. 20 tablet 0  . polyethylene glycol (MIRALAX / GLYCOLAX) 17 g packet Take 17 g by mouth daily. 14 each 0  . traZODone (DESYREL) 50 MG tablet     . Vitamin D, Ergocalciferol, (DRISDOL) 1.25 MG (50000 UT) CAPS capsule Take 50,000 Units by mouth every Sunday. Takes on Sunday     . XYZAL ALLERGY 24HR 5 MG tablet Take 5 mg by mouth daily.     No current facility-administered medications for this visit.    REVIEW OF SYSTEMS:  [X]  denotes positive finding, [ ]  denotes negative finding Cardiac  Comments:  Chest pain or chest pressure:    Shortness of breath upon exertion:    Short of breath when lying flat:    Irregular heart rhythm:        Vascular    Pain in calf, thigh, or hip brought on by ambulation:    Pain in feet at night that wakes you up from your sleep:     Blood clot in your veins:    Leg swelling:           PHYSICAL EXAM: Vitals:   02/06/20 1138  BP: (!)  157/74  Pulse: 88  Resp: 20  Temp: (!) 97.2 F (36.2 C)  SpO2: 95%  Weight: 148 lb (67.1  kg)  Height: 5\' 5"  (1.651 m)    GENERAL: The patient is a well-nourished female, in no acute distress. The vital signs are documented above. CARDIOVASCULAR: Left upper arm AV fistula with excellent thrill.  No evidence of aneurysm for radiation.  Palpable popliteal pulses bilaterally. PULMONARY: There is good air exchange  MUSCULOSKELETAL: There are no major deformities or cyanosis. NEUROLOGIC: No focal weakness or paresthesias are detected. SKIN: There are no ulcers or rashes noted. PSYCHIATRIC: The patient has a normal affect.  DATA:  Noninvasive studies reveal ankle arm index of 1.0 bilaterally.  MEDICAL ISSUES: Doing well overall.  We will continue her walking program.  Will see [place] again in 1 year with repeat noninvasive studies.  She knows to report immediately should she develop any evidence of ischemia    Rosetta Posner, MD [facility] and Ve[facility] Specialists [facility] Tel [phone] Pager [phone]

## 2020-02-07 DIAGNOSIS — Z992 Dependence on renal dialysis: Secondary | ICD-10-CM | POA: Diagnosis not present

## 2020-02-07 DIAGNOSIS — N186 End stage renal disease: Secondary | ICD-10-CM | POA: Diagnosis not present

## 2020-02-07 DIAGNOSIS — E876 Hypokalemia: Secondary | ICD-10-CM | POA: Diagnosis not present

## 2020-02-07 DIAGNOSIS — N2581 Secondary hyperparathyroidism of renal origin: Secondary | ICD-10-CM | POA: Diagnosis not present

## 2020-02-09 DIAGNOSIS — E876 Hypokalemia: Secondary | ICD-10-CM | POA: Diagnosis not present

## 2020-02-09 DIAGNOSIS — Z992 Dependence on renal dialysis: Secondary | ICD-10-CM | POA: Diagnosis not present

## 2020-02-09 DIAGNOSIS — N186 End stage renal disease: Secondary | ICD-10-CM | POA: Diagnosis not present

## 2020-02-09 DIAGNOSIS — N2581 Secondary hyperparathyroidism of renal origin: Secondary | ICD-10-CM | POA: Diagnosis not present

## 2020-02-12 DIAGNOSIS — Z992 Dependence on renal dialysis: Secondary | ICD-10-CM | POA: Diagnosis not present

## 2020-02-12 DIAGNOSIS — N2581 Secondary hyperparathyroidism of renal origin: Secondary | ICD-10-CM | POA: Diagnosis not present

## 2020-02-12 DIAGNOSIS — N186 End stage renal disease: Secondary | ICD-10-CM | POA: Diagnosis not present

## 2020-02-12 DIAGNOSIS — E876 Hypokalemia: Secondary | ICD-10-CM | POA: Diagnosis not present

## 2020-02-13 ENCOUNTER — Telehealth: Payer: Self-pay

## 2020-02-13 ENCOUNTER — Ambulatory Visit (INDEPENDENT_AMBULATORY_CARE_PROVIDER_SITE_OTHER): Payer: Medicare Other

## 2020-02-13 DIAGNOSIS — J302 Other seasonal allergic rhinitis: Secondary | ICD-10-CM

## 2020-02-13 DIAGNOSIS — J309 Allergic rhinitis, unspecified: Secondary | ICD-10-CM | POA: Diagnosis not present

## 2020-02-13 NOTE — Telephone Encounter (Signed)
Patient called and stated she got a call from [facility] and needs a PA done on Auvi-Q

## 2020-02-14 DIAGNOSIS — Z992 Dependence on renal dialysis: Secondary | ICD-10-CM | POA: Diagnosis not present

## 2020-02-14 DIAGNOSIS — E876 Hypokalemia: Secondary | ICD-10-CM | POA: Diagnosis not present

## 2020-02-14 DIAGNOSIS — N186 End stage renal disease: Secondary | ICD-10-CM | POA: Diagnosis not present

## 2020-02-14 DIAGNOSIS — N2581 Secondary hyperparathyroidism of renal origin: Secondary | ICD-10-CM | POA: Diagnosis not present

## 2020-02-14 NOTE — Telephone Encounter (Signed)
Dr. Ernst Bowler are you okay with just switching to regular epipen. Medicare will not cover Auvi-Q without a very good reason. I have tried before with other patient?

## 2020-02-15 MED ORDER — EPINEPHRINE 0.3 MG/0.3ML IJ SOAJ
0.3000 mg | INTRAMUSCULAR | 1 refills | Status: DC | PRN
Start: 2020-02-15 — End: 2021-01-09

## 2020-02-15 NOTE — Telephone Encounter (Signed)
Mylan/teva brand generic sent in as patient's insurance will not cover the Auvi-Q 0.3 mg. I spoke with patient and explained this to her. She will wait for the pharmacy to fill the generic epipen and let us know about the cost. May be able to get her a sample of Auvi-Q if available.

## 2020-02-15 NOTE — Telephone Encounter (Signed)
That is fine with me.  Salvatore Marvel, MD [facility] [place] [facility] [place]

## 2020-02-15 NOTE — Addendum Note (Signed)
Addended by: Lucrezia Starch I on: 02/15/2020 07:07 PM   Modules accepted: Orders

## 2020-02-16 DIAGNOSIS — E876 Hypokalemia: Secondary | ICD-10-CM | POA: Diagnosis not present

## 2020-02-16 DIAGNOSIS — Z992 Dependence on renal dialysis: Secondary | ICD-10-CM | POA: Diagnosis not present

## 2020-02-16 DIAGNOSIS — N2581 Secondary hyperparathyroidism of renal origin: Secondary | ICD-10-CM | POA: Diagnosis not present

## 2020-02-16 DIAGNOSIS — N186 End stage renal disease: Secondary | ICD-10-CM | POA: Diagnosis not present

## 2020-02-19 DIAGNOSIS — Z992 Dependence on renal dialysis: Secondary | ICD-10-CM | POA: Diagnosis not present

## 2020-02-19 DIAGNOSIS — N2581 Secondary hyperparathyroidism of renal origin: Secondary | ICD-10-CM | POA: Diagnosis not present

## 2020-02-19 DIAGNOSIS — N186 End stage renal disease: Secondary | ICD-10-CM | POA: Diagnosis not present

## 2020-02-19 DIAGNOSIS — E876 Hypokalemia: Secondary | ICD-10-CM | POA: Diagnosis not present

## 2020-02-20 ENCOUNTER — Ambulatory Visit (INDEPENDENT_AMBULATORY_CARE_PROVIDER_SITE_OTHER): Payer: Medicare Other

## 2020-02-20 DIAGNOSIS — J309 Allergic rhinitis, unspecified: Secondary | ICD-10-CM | POA: Diagnosis not present

## 2020-02-20 DIAGNOSIS — J3089 Other allergic rhinitis: Secondary | ICD-10-CM

## 2020-02-20 DIAGNOSIS — J302 Other seasonal allergic rhinitis: Secondary | ICD-10-CM

## 2020-02-21 ENCOUNTER — Ambulatory Visit: Payer: Self-pay | Admitting: *Deleted

## 2020-02-21 DIAGNOSIS — N2581 Secondary hyperparathyroidism of renal origin: Secondary | ICD-10-CM | POA: Diagnosis not present

## 2020-02-21 DIAGNOSIS — N186 End stage renal disease: Secondary | ICD-10-CM | POA: Diagnosis not present

## 2020-02-21 DIAGNOSIS — E876 Hypokalemia: Secondary | ICD-10-CM | POA: Diagnosis not present

## 2020-02-21 DIAGNOSIS — Z992 Dependence on renal dialysis: Secondary | ICD-10-CM | POA: Diagnosis not present

## 2020-02-23 DIAGNOSIS — N186 End stage renal disease: Secondary | ICD-10-CM | POA: Diagnosis not present

## 2020-02-23 DIAGNOSIS — N2581 Secondary hyperparathyroidism of renal origin: Secondary | ICD-10-CM | POA: Diagnosis not present

## 2020-02-23 DIAGNOSIS — E876 Hypokalemia: Secondary | ICD-10-CM | POA: Diagnosis not present

## 2020-02-23 DIAGNOSIS — Z992 Dependence on renal dialysis: Secondary | ICD-10-CM | POA: Diagnosis not present

## 2020-02-25 DIAGNOSIS — N186 End stage renal disease: Secondary | ICD-10-CM | POA: Diagnosis not present

## 2020-02-25 DIAGNOSIS — Z992 Dependence on renal dialysis: Secondary | ICD-10-CM | POA: Diagnosis not present

## 2020-02-25 DIAGNOSIS — I159 Secondary hypertension, unspecified: Secondary | ICD-10-CM | POA: Diagnosis not present

## 2020-02-26 DIAGNOSIS — N2581 Secondary hyperparathyroidism of renal origin: Secondary | ICD-10-CM | POA: Diagnosis not present

## 2020-02-26 DIAGNOSIS — Z992 Dependence on renal dialysis: Secondary | ICD-10-CM | POA: Diagnosis not present

## 2020-02-26 DIAGNOSIS — N186 End stage renal disease: Secondary | ICD-10-CM | POA: Diagnosis not present

## 2020-02-26 DIAGNOSIS — E876 Hypokalemia: Secondary | ICD-10-CM | POA: Diagnosis not present

## 2020-02-26 DIAGNOSIS — D631 Anemia in chronic kidney disease: Secondary | ICD-10-CM | POA: Diagnosis not present

## 2020-02-27 ENCOUNTER — Ambulatory Visit (INDEPENDENT_AMBULATORY_CARE_PROVIDER_SITE_OTHER): Payer: Medicare Other

## 2020-02-27 DIAGNOSIS — J309 Allergic rhinitis, unspecified: Secondary | ICD-10-CM | POA: Diagnosis not present

## 2020-02-27 DIAGNOSIS — J302 Other seasonal allergic rhinitis: Secondary | ICD-10-CM

## 2020-02-28 DIAGNOSIS — N186 End stage renal disease: Secondary | ICD-10-CM | POA: Diagnosis not present

## 2020-02-28 DIAGNOSIS — E876 Hypokalemia: Secondary | ICD-10-CM | POA: Diagnosis not present

## 2020-02-28 DIAGNOSIS — N2581 Secondary hyperparathyroidism of renal origin: Secondary | ICD-10-CM | POA: Diagnosis not present

## 2020-02-28 DIAGNOSIS — Z992 Dependence on renal dialysis: Secondary | ICD-10-CM | POA: Diagnosis not present

## 2020-02-28 DIAGNOSIS — D631 Anemia in chronic kidney disease: Secondary | ICD-10-CM | POA: Diagnosis not present

## 2020-03-01 DIAGNOSIS — D631 Anemia in chronic kidney disease: Secondary | ICD-10-CM | POA: Diagnosis not present

## 2020-03-01 DIAGNOSIS — Z992 Dependence on renal dialysis: Secondary | ICD-10-CM | POA: Diagnosis not present

## 2020-03-01 DIAGNOSIS — N2581 Secondary hyperparathyroidism of renal origin: Secondary | ICD-10-CM | POA: Diagnosis not present

## 2020-03-01 DIAGNOSIS — E876 Hypokalemia: Secondary | ICD-10-CM | POA: Diagnosis not present

## 2020-03-01 DIAGNOSIS — N186 End stage renal disease: Secondary | ICD-10-CM | POA: Diagnosis not present

## 2020-03-04 DIAGNOSIS — N186 End stage renal disease: Secondary | ICD-10-CM | POA: Diagnosis not present

## 2020-03-04 DIAGNOSIS — Z992 Dependence on renal dialysis: Secondary | ICD-10-CM | POA: Diagnosis not present

## 2020-03-04 DIAGNOSIS — N2581 Secondary hyperparathyroidism of renal origin: Secondary | ICD-10-CM | POA: Diagnosis not present

## 2020-03-04 DIAGNOSIS — E876 Hypokalemia: Secondary | ICD-10-CM | POA: Diagnosis not present

## 2020-03-04 DIAGNOSIS — D631 Anemia in chronic kidney disease: Secondary | ICD-10-CM | POA: Diagnosis not present

## 2020-03-05 ENCOUNTER — Ambulatory Visit (INDEPENDENT_AMBULATORY_CARE_PROVIDER_SITE_OTHER): Payer: Medicare Other

## 2020-03-05 DIAGNOSIS — J302 Other seasonal allergic rhinitis: Secondary | ICD-10-CM

## 2020-03-05 DIAGNOSIS — J3089 Other allergic rhinitis: Secondary | ICD-10-CM

## 2020-03-05 DIAGNOSIS — J309 Allergic rhinitis, unspecified: Secondary | ICD-10-CM | POA: Diagnosis not present

## 2020-03-06 DIAGNOSIS — Z992 Dependence on renal dialysis: Secondary | ICD-10-CM | POA: Diagnosis not present

## 2020-03-06 DIAGNOSIS — D631 Anemia in chronic kidney disease: Secondary | ICD-10-CM | POA: Diagnosis not present

## 2020-03-06 DIAGNOSIS — E876 Hypokalemia: Secondary | ICD-10-CM | POA: Diagnosis not present

## 2020-03-06 DIAGNOSIS — N2581 Secondary hyperparathyroidism of renal origin: Secondary | ICD-10-CM | POA: Diagnosis not present

## 2020-03-06 DIAGNOSIS — N186 End stage renal disease: Secondary | ICD-10-CM | POA: Diagnosis not present

## 2020-03-08 DIAGNOSIS — E876 Hypokalemia: Secondary | ICD-10-CM | POA: Diagnosis not present

## 2020-03-08 DIAGNOSIS — D631 Anemia in chronic kidney disease: Secondary | ICD-10-CM | POA: Diagnosis not present

## 2020-03-08 DIAGNOSIS — Z992 Dependence on renal dialysis: Secondary | ICD-10-CM | POA: Diagnosis not present

## 2020-03-08 DIAGNOSIS — N186 End stage renal disease: Secondary | ICD-10-CM | POA: Diagnosis not present

## 2020-03-08 DIAGNOSIS — N2581 Secondary hyperparathyroidism of renal origin: Secondary | ICD-10-CM | POA: Diagnosis not present

## 2020-03-11 DIAGNOSIS — E876 Hypokalemia: Secondary | ICD-10-CM | POA: Diagnosis not present

## 2020-03-11 DIAGNOSIS — N186 End stage renal disease: Secondary | ICD-10-CM | POA: Diagnosis not present

## 2020-03-11 DIAGNOSIS — D631 Anemia in chronic kidney disease: Secondary | ICD-10-CM | POA: Diagnosis not present

## 2020-03-11 DIAGNOSIS — Z992 Dependence on renal dialysis: Secondary | ICD-10-CM | POA: Diagnosis not present

## 2020-03-11 DIAGNOSIS — N2581 Secondary hyperparathyroidism of renal origin: Secondary | ICD-10-CM | POA: Diagnosis not present

## 2020-03-12 NOTE — Progress Notes (Signed)
Sample of [place] given to patient as Xyzal is not working for her. I did clear this verbally with Dr. Ernst Bowler.

## 2020-03-13 DIAGNOSIS — Z992 Dependence on renal dialysis: Secondary | ICD-10-CM | POA: Diagnosis not present

## 2020-03-13 DIAGNOSIS — E876 Hypokalemia: Secondary | ICD-10-CM | POA: Diagnosis not present

## 2020-03-13 DIAGNOSIS — N2581 Secondary hyperparathyroidism of renal origin: Secondary | ICD-10-CM | POA: Diagnosis not present

## 2020-03-13 DIAGNOSIS — N186 End stage renal disease: Secondary | ICD-10-CM | POA: Diagnosis not present

## 2020-03-13 DIAGNOSIS — D631 Anemia in chronic kidney disease: Secondary | ICD-10-CM | POA: Diagnosis not present

## 2020-03-14 ENCOUNTER — Ambulatory Visit (INDEPENDENT_AMBULATORY_CARE_PROVIDER_SITE_OTHER): Payer: Medicare Other

## 2020-03-14 DIAGNOSIS — J309 Allergic rhinitis, unspecified: Secondary | ICD-10-CM

## 2020-03-14 DIAGNOSIS — J302 Other seasonal allergic rhinitis: Secondary | ICD-10-CM

## 2020-03-14 DIAGNOSIS — J3089 Other allergic rhinitis: Secondary | ICD-10-CM

## 2020-03-14 DIAGNOSIS — H0288B Meibomian gland dysfunction left eye, upper and lower eyelids: Secondary | ICD-10-CM | POA: Diagnosis not present

## 2020-03-14 DIAGNOSIS — H0288A Meibomian gland dysfunction right eye, upper and lower eyelids: Secondary | ICD-10-CM | POA: Diagnosis not present

## 2020-03-14 DIAGNOSIS — H25813 Combined forms of age-related cataract, bilateral: Secondary | ICD-10-CM | POA: Diagnosis not present

## 2020-03-14 DIAGNOSIS — E113593 Type 2 diabetes mellitus with proliferative diabetic retinopathy without macular edema, bilateral: Secondary | ICD-10-CM | POA: Diagnosis not present

## 2020-03-15 DIAGNOSIS — D631 Anemia in chronic kidney disease: Secondary | ICD-10-CM | POA: Diagnosis not present

## 2020-03-15 DIAGNOSIS — N2581 Secondary hyperparathyroidism of renal origin: Secondary | ICD-10-CM | POA: Diagnosis not present

## 2020-03-15 DIAGNOSIS — Z992 Dependence on renal dialysis: Secondary | ICD-10-CM | POA: Diagnosis not present

## 2020-03-15 DIAGNOSIS — N186 End stage renal disease: Secondary | ICD-10-CM | POA: Diagnosis not present

## 2020-03-15 DIAGNOSIS — E876 Hypokalemia: Secondary | ICD-10-CM | POA: Diagnosis not present

## 2020-03-18 DIAGNOSIS — E876 Hypokalemia: Secondary | ICD-10-CM | POA: Diagnosis not present

## 2020-03-18 DIAGNOSIS — N2581 Secondary hyperparathyroidism of renal origin: Secondary | ICD-10-CM | POA: Diagnosis not present

## 2020-03-18 DIAGNOSIS — N186 End stage renal disease: Secondary | ICD-10-CM | POA: Diagnosis not present

## 2020-03-18 DIAGNOSIS — D631 Anemia in chronic kidney disease: Secondary | ICD-10-CM | POA: Diagnosis not present

## 2020-03-18 DIAGNOSIS — Z992 Dependence on renal dialysis: Secondary | ICD-10-CM | POA: Diagnosis not present

## 2020-03-19 ENCOUNTER — Ambulatory Visit (INDEPENDENT_AMBULATORY_CARE_PROVIDER_SITE_OTHER): Payer: Medicare Other

## 2020-03-19 DIAGNOSIS — J3089 Other allergic rhinitis: Secondary | ICD-10-CM

## 2020-03-19 DIAGNOSIS — J309 Allergic rhinitis, unspecified: Secondary | ICD-10-CM

## 2020-03-19 DIAGNOSIS — J302 Other seasonal allergic rhinitis: Secondary | ICD-10-CM

## 2020-03-20 DIAGNOSIS — N186 End stage renal disease: Secondary | ICD-10-CM | POA: Diagnosis not present

## 2020-03-20 DIAGNOSIS — E876 Hypokalemia: Secondary | ICD-10-CM | POA: Diagnosis not present

## 2020-03-20 DIAGNOSIS — N2581 Secondary hyperparathyroidism of renal origin: Secondary | ICD-10-CM | POA: Diagnosis not present

## 2020-03-20 DIAGNOSIS — Z992 Dependence on renal dialysis: Secondary | ICD-10-CM | POA: Diagnosis not present

## 2020-03-20 DIAGNOSIS — D631 Anemia in chronic kidney disease: Secondary | ICD-10-CM | POA: Diagnosis not present

## 2020-03-22 DIAGNOSIS — N186 End stage renal disease: Secondary | ICD-10-CM | POA: Diagnosis not present

## 2020-03-22 DIAGNOSIS — Z992 Dependence on renal dialysis: Secondary | ICD-10-CM | POA: Diagnosis not present

## 2020-03-22 DIAGNOSIS — N2581 Secondary hyperparathyroidism of renal origin: Secondary | ICD-10-CM | POA: Diagnosis not present

## 2020-03-22 DIAGNOSIS — D631 Anemia in chronic kidney disease: Secondary | ICD-10-CM | POA: Diagnosis not present

## 2020-03-22 DIAGNOSIS — E876 Hypokalemia: Secondary | ICD-10-CM | POA: Diagnosis not present

## 2020-03-25 DIAGNOSIS — N2581 Secondary hyperparathyroidism of renal origin: Secondary | ICD-10-CM | POA: Diagnosis not present

## 2020-03-25 DIAGNOSIS — D631 Anemia in chronic kidney disease: Secondary | ICD-10-CM | POA: Diagnosis not present

## 2020-03-25 DIAGNOSIS — N186 End stage renal disease: Secondary | ICD-10-CM | POA: Diagnosis not present

## 2020-03-25 DIAGNOSIS — E876 Hypokalemia: Secondary | ICD-10-CM | POA: Diagnosis not present

## 2020-03-25 DIAGNOSIS — Z992 Dependence on renal dialysis: Secondary | ICD-10-CM | POA: Diagnosis not present

## 2020-03-26 ENCOUNTER — Ambulatory Visit (INDEPENDENT_AMBULATORY_CARE_PROVIDER_SITE_OTHER): Payer: Medicare Other

## 2020-03-26 DIAGNOSIS — J301 Allergic rhinitis due to pollen: Secondary | ICD-10-CM

## 2020-03-26 DIAGNOSIS — J309 Allergic rhinitis, unspecified: Secondary | ICD-10-CM

## 2020-03-27 DIAGNOSIS — Z23 Encounter for immunization: Secondary | ICD-10-CM | POA: Diagnosis not present

## 2020-03-27 DIAGNOSIS — N2581 Secondary hyperparathyroidism of renal origin: Secondary | ICD-10-CM | POA: Diagnosis not present

## 2020-03-27 DIAGNOSIS — I159 Secondary hypertension, unspecified: Secondary | ICD-10-CM | POA: Diagnosis not present

## 2020-03-27 DIAGNOSIS — D631 Anemia in chronic kidney disease: Secondary | ICD-10-CM | POA: Diagnosis not present

## 2020-03-27 DIAGNOSIS — N186 End stage renal disease: Secondary | ICD-10-CM | POA: Diagnosis not present

## 2020-03-27 DIAGNOSIS — E876 Hypokalemia: Secondary | ICD-10-CM | POA: Diagnosis not present

## 2020-03-27 DIAGNOSIS — Z992 Dependence on renal dialysis: Secondary | ICD-10-CM | POA: Diagnosis not present

## 2020-03-28 ENCOUNTER — Encounter: Payer: Self-pay | Admitting: *Deleted

## 2020-03-28 ENCOUNTER — Other Ambulatory Visit: Payer: Self-pay | Admitting: *Deleted

## 2020-03-28 ENCOUNTER — Other Ambulatory Visit: Payer: Self-pay

## 2020-03-28 NOTE — Patient Outreach (Addendum)
Destiny Day [facility]) Care Management  03/28/2020  Destiny Day 1951-03-09 [id number]   [facility] complex care Follow up  Destiny Day was referred to [facility] on 05/23/2019 for complex care services from [place], [place] after her discharge from [facility] on 05/22/19 for covid 19, PNA. Patient had been active with [facility] health coach services ( [place]) prior to this admission.  Destiny Destiny Day was re admitted on 06/25/19 to 06/27/19 for acute on chronic diastoliccongestive Heart Failure (CHF)in the setting of ESRD.    Successful outreach to her mobile number  She is able to verify HIPAA ([place]) identifiers, date of birth (DOB) and address   Follow up assessment  Destiny Destiny Day reports she is doing well with managing her health at home    She reports she did go to urgent care twice since the last outreach but for now resolved issues for her right hip pain and cough related to her allergies She has a new Prescription (Rx) for Get 2 shots a year at the allergy and asthma Dr Thomasene Ripple is now vaccinated - Moderna shots x 2 -is awaiting a covid booster Following covid precautions  Chronic Kidney disease (CKD)/Dialysis going fine Past few months has been  congestive Heart Failure (CHF) no hospitalizations in 2021 but has allergies symptoms and CHF symptoms that are familiar. Her son and husband frequently encourage her to call EMS when she begins coughing. She denies swelling  She is monitoring her liquids/fluid restriction In 2021 she had issues with extra fluid intake as she craved ice  Discuss importance of weighing daily She is not weighing daily at home but a HD/MD is aware her wt recently was 145-146 lbs She reports she is aware of the difference in her allergy symptoms and her CHF symptoms Has a hx of abnormal phosphorus levels  over the next 120 days patient will verbalize maintenance of CKD,  COPD, CHF and HTN during follow up contact Chronic obstructive pulmonary disease (COPD) is managed well with medications Denies concerns  Diabetes She reports she is no longer taking insulin and only on glipizide She reports the last Hg A1c =6.1 Unable to find in Epic for 2021   Hypertension (HTN) blood pressure (BP) remains elevated in Hemodialysis (HD) sessions 144Y systolic BP   Confirmed her pcp as Cipriano Mile, NP at  [place] [address] [phone] - Epic updated at her pcp area was blank   Eyes  check Dr Katy Fitch then referred to Dr Delanna Ahmadi (April 14 2020) for cataract and floaters in both eyes   Destiny Day agrees to transfer to  [facility] disease management/health coach services    Social:Destiny Destiny Day is a 69 year old female who lives at home with her spouse, Destiny Day and recently her grandson. 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 She is a dialysis patient who is presently being seen on Tuesdays, Thursdays and Saturdays since her discharge home on 05/22/19. She has been going to the [facility] at [place]. She reports she is independent with her care needs with assistance withsomeiADLs as she uses a walker for ambulation. She denies need of transportation assistance She has grandchildren,a son and daughter She states she has a daughter in Sports coach in [place].  She loves to [place] and joke but reports she is "no Joke"  She reports being on a fixed income of social security She reports she does not have a computer and does not know how to operate one She and her husband have been going out during the covid pandemic to pay bills and to grocery shop only  She is reporting her son is home from [place] and is staying with her She reports they postponed preparing to move to a one level home to assist with her and husband's mobility concerns.  A new one level home would prevent having to climb  stairs Her and her husband have moved to the lower level of her home   Conditions:Covid-19 positive on 05/17/19- negative on 05/30/19 & 06/01/19,PNA, hypoxia,CHF, HTN, PAD, asthma, allergies,COPD, hx of CAP/PNA, GERD, hx of UGI bleed, DM type 2, hx of traumatic SDH, right side weakness, right achilles tendon contracture, right septic knee,CKD with left upper arm AV Gore-Tex graft., hxof fall, anemia, hx of bilateral transmetatarsal amputations with bilateral cock-up splints former smoker quit 05/10/2012   GNF:AOZHYQ,MVHQI, w/c, cbg meter  appointments 9/21/ 2021 Dr Allena Katz, Salvatore Marvel Allergist 04/18/2020 Deloria Lair, Retina specialist 01/09/21 Cardiology Dr Adrian Prows   Plans: [facility] RN CM will transfer Destiny Day to  [facility] disease management/health coach Services as agreed upon  Pt encouraged to return a call to [facility] RN CM prn Routed note to MD Letters to patient and MD for [facility] CM case closure and transfer to [facility] DM services   Goals Addressed              This Visit's Progress     Patient Stated   .  Patient will be able to manage/maintain her care of her HTN, CKD and CHF at home (pt-stated)        [place] (see longitudinal plan of care for additional care plan information)  Objective:  . Last practice recorded BP readings:  BP Readings from Last 3 Encounters:  02/06/20 (!) 157/74  02/04/20 (!) 169/49  01/09/20 124/82 .   Marland Kitchen Most recent eGFR/CrCl: No results found for: EGFR  No components found for: CRCL  Current Barriers:  Marland Kitchen Knowledge deficit related to self care management of hypertension . Knowledge deficit related to dangers of uncontrolled hypertension . Cognitive Deficits  Case Manager Clinical Goal(s):  Marland Kitchen Over the next 45 days, patient will verbalize basic understanding of hypertension disease process and self health management plan as evidenced by reported bp values within normal limits during HD/at home  . over the next 90 days patient  will verbalize maintanence of CKD, CHF and HTN during follow up contact  Interventions:  . Evaluation of current treatment plan related to hypertension self management and patient's adherence to plan as established by provider. . Reviewed medications with patient and discussed importance of compliance . Discussed plans with patient for ongoing care management follow up and provided patient with direct contact information for care management team . Provided education regarding s/s hypertensive crisis . Discussed signs and symptoms of hypertension  Patient Self Care Activities:  . Self administers medications as prescribed . Attends all scheduled provider appointments . Calls provider office for new concerns, questions, or BP outside discussed parameters . Monitors BP and records as discussed . Adheres to a low sodium diet/DASH diet . Increase physical activity as tolerated . Verbalize where to go to receive medical care . Maintain a good phosphorus level per HD records . Call MD for new concerns with increased symptoms per CHF action plan (increased coughing  and wt gain not related to allergies)  Initial goal documentation  Please see other previous [facility] care plan information prior to 03/27/20 listed in Epic under the flow sheet section           Yanilen Adamik L. Lavina Hamman, RN, BSN, Jay Coordinator Office number [phone] Mobile number [phone]  Main [facility] number [phone] Fax number [phone]

## 2020-03-29 ENCOUNTER — Other Ambulatory Visit: Payer: Self-pay | Admitting: *Deleted

## 2020-03-29 DIAGNOSIS — N186 End stage renal disease: Secondary | ICD-10-CM | POA: Diagnosis not present

## 2020-03-29 DIAGNOSIS — N2581 Secondary hyperparathyroidism of renal origin: Secondary | ICD-10-CM | POA: Diagnosis not present

## 2020-03-29 DIAGNOSIS — Z992 Dependence on renal dialysis: Secondary | ICD-10-CM | POA: Diagnosis not present

## 2020-03-29 DIAGNOSIS — Z23 Encounter for immunization: Secondary | ICD-10-CM | POA: Diagnosis not present

## 2020-03-29 DIAGNOSIS — D631 Anemia in chronic kidney disease: Secondary | ICD-10-CM | POA: Diagnosis not present

## 2020-03-29 DIAGNOSIS — E876 Hypokalemia: Secondary | ICD-10-CM | POA: Diagnosis not present

## 2020-04-01 DIAGNOSIS — N2581 Secondary hyperparathyroidism of renal origin: Secondary | ICD-10-CM | POA: Diagnosis not present

## 2020-04-01 DIAGNOSIS — Z992 Dependence on renal dialysis: Secondary | ICD-10-CM | POA: Diagnosis not present

## 2020-04-01 DIAGNOSIS — E876 Hypokalemia: Secondary | ICD-10-CM | POA: Diagnosis not present

## 2020-04-01 DIAGNOSIS — D631 Anemia in chronic kidney disease: Secondary | ICD-10-CM | POA: Diagnosis not present

## 2020-04-01 DIAGNOSIS — N186 End stage renal disease: Secondary | ICD-10-CM | POA: Diagnosis not present

## 2020-04-01 DIAGNOSIS — Z23 Encounter for immunization: Secondary | ICD-10-CM | POA: Diagnosis not present

## 2020-04-02 ENCOUNTER — Ambulatory Visit (INDEPENDENT_AMBULATORY_CARE_PROVIDER_SITE_OTHER): Payer: Medicare Other

## 2020-04-02 DIAGNOSIS — J309 Allergic rhinitis, unspecified: Secondary | ICD-10-CM | POA: Diagnosis not present

## 2020-04-02 DIAGNOSIS — J301 Allergic rhinitis due to pollen: Secondary | ICD-10-CM

## 2020-04-03 DIAGNOSIS — N186 End stage renal disease: Secondary | ICD-10-CM | POA: Diagnosis not present

## 2020-04-03 DIAGNOSIS — E876 Hypokalemia: Secondary | ICD-10-CM | POA: Diagnosis not present

## 2020-04-03 DIAGNOSIS — Z23 Encounter for immunization: Secondary | ICD-10-CM | POA: Diagnosis not present

## 2020-04-03 DIAGNOSIS — Z992 Dependence on renal dialysis: Secondary | ICD-10-CM | POA: Diagnosis not present

## 2020-04-03 DIAGNOSIS — N2581 Secondary hyperparathyroidism of renal origin: Secondary | ICD-10-CM | POA: Diagnosis not present

## 2020-04-03 DIAGNOSIS — D631 Anemia in chronic kidney disease: Secondary | ICD-10-CM | POA: Diagnosis not present

## 2020-04-04 DIAGNOSIS — T782XXA Anaphylactic shock, unspecified, initial encounter: Secondary | ICD-10-CM | POA: Insufficient documentation

## 2020-04-04 DIAGNOSIS — T7840XA Allergy, unspecified, initial encounter: Secondary | ICD-10-CM | POA: Insufficient documentation

## 2020-04-05 DIAGNOSIS — Z23 Encounter for immunization: Secondary | ICD-10-CM | POA: Diagnosis not present

## 2020-04-05 DIAGNOSIS — E876 Hypokalemia: Secondary | ICD-10-CM | POA: Diagnosis not present

## 2020-04-05 DIAGNOSIS — Z992 Dependence on renal dialysis: Secondary | ICD-10-CM | POA: Diagnosis not present

## 2020-04-05 DIAGNOSIS — N2581 Secondary hyperparathyroidism of renal origin: Secondary | ICD-10-CM | POA: Diagnosis not present

## 2020-04-05 DIAGNOSIS — N186 End stage renal disease: Secondary | ICD-10-CM | POA: Diagnosis not present

## 2020-04-05 DIAGNOSIS — D631 Anemia in chronic kidney disease: Secondary | ICD-10-CM | POA: Diagnosis not present

## 2020-04-08 DIAGNOSIS — Z23 Encounter for immunization: Secondary | ICD-10-CM | POA: Diagnosis not present

## 2020-04-08 DIAGNOSIS — E876 Hypokalemia: Secondary | ICD-10-CM | POA: Diagnosis not present

## 2020-04-08 DIAGNOSIS — N186 End stage renal disease: Secondary | ICD-10-CM | POA: Diagnosis not present

## 2020-04-08 DIAGNOSIS — D631 Anemia in chronic kidney disease: Secondary | ICD-10-CM | POA: Diagnosis not present

## 2020-04-08 DIAGNOSIS — N2581 Secondary hyperparathyroidism of renal origin: Secondary | ICD-10-CM | POA: Diagnosis not present

## 2020-04-08 DIAGNOSIS — Z992 Dependence on renal dialysis: Secondary | ICD-10-CM | POA: Diagnosis not present

## 2020-04-09 ENCOUNTER — Ambulatory Visit (INDEPENDENT_AMBULATORY_CARE_PROVIDER_SITE_OTHER): Payer: Medicare Other | Admitting: *Deleted

## 2020-04-09 DIAGNOSIS — J309 Allergic rhinitis, unspecified: Secondary | ICD-10-CM

## 2020-04-10 DIAGNOSIS — Z992 Dependence on renal dialysis: Secondary | ICD-10-CM | POA: Diagnosis not present

## 2020-04-10 DIAGNOSIS — Z23 Encounter for immunization: Secondary | ICD-10-CM | POA: Diagnosis not present

## 2020-04-10 DIAGNOSIS — N186 End stage renal disease: Secondary | ICD-10-CM | POA: Diagnosis not present

## 2020-04-10 DIAGNOSIS — E876 Hypokalemia: Secondary | ICD-10-CM | POA: Diagnosis not present

## 2020-04-10 DIAGNOSIS — N2581 Secondary hyperparathyroidism of renal origin: Secondary | ICD-10-CM | POA: Diagnosis not present

## 2020-04-10 DIAGNOSIS — D631 Anemia in chronic kidney disease: Secondary | ICD-10-CM | POA: Diagnosis not present

## 2020-04-12 DIAGNOSIS — N2581 Secondary hyperparathyroidism of renal origin: Secondary | ICD-10-CM | POA: Diagnosis not present

## 2020-04-12 DIAGNOSIS — E876 Hypokalemia: Secondary | ICD-10-CM | POA: Diagnosis not present

## 2020-04-12 DIAGNOSIS — D631 Anemia in chronic kidney disease: Secondary | ICD-10-CM | POA: Diagnosis not present

## 2020-04-12 DIAGNOSIS — N186 End stage renal disease: Secondary | ICD-10-CM | POA: Diagnosis not present

## 2020-04-12 DIAGNOSIS — Z23 Encounter for immunization: Secondary | ICD-10-CM | POA: Diagnosis not present

## 2020-04-12 DIAGNOSIS — Z992 Dependence on renal dialysis: Secondary | ICD-10-CM | POA: Diagnosis not present

## 2020-04-15 DIAGNOSIS — D631 Anemia in chronic kidney disease: Secondary | ICD-10-CM | POA: Diagnosis not present

## 2020-04-15 DIAGNOSIS — Z992 Dependence on renal dialysis: Secondary | ICD-10-CM | POA: Diagnosis not present

## 2020-04-15 DIAGNOSIS — E876 Hypokalemia: Secondary | ICD-10-CM | POA: Diagnosis not present

## 2020-04-15 DIAGNOSIS — N186 End stage renal disease: Secondary | ICD-10-CM | POA: Diagnosis not present

## 2020-04-15 DIAGNOSIS — Z23 Encounter for immunization: Secondary | ICD-10-CM | POA: Diagnosis not present

## 2020-04-15 DIAGNOSIS — N2581 Secondary hyperparathyroidism of renal origin: Secondary | ICD-10-CM | POA: Diagnosis not present

## 2020-04-16 ENCOUNTER — Other Ambulatory Visit: Payer: Self-pay

## 2020-04-16 ENCOUNTER — Encounter: Payer: Self-pay | Admitting: Allergy & Immunology

## 2020-04-16 ENCOUNTER — Ambulatory Visit (INDEPENDENT_AMBULATORY_CARE_PROVIDER_SITE_OTHER): Payer: Medicare Other | Admitting: Allergy & Immunology

## 2020-04-16 VITALS — BP 110/68 | HR 88 | Resp 18

## 2020-04-16 DIAGNOSIS — J4540 Moderate persistent asthma, uncomplicated: Secondary | ICD-10-CM | POA: Diagnosis not present

## 2020-04-16 DIAGNOSIS — J302 Other seasonal allergic rhinitis: Secondary | ICD-10-CM | POA: Diagnosis not present

## 2020-04-16 DIAGNOSIS — J3089 Other allergic rhinitis: Secondary | ICD-10-CM | POA: Diagnosis not present

## 2020-04-16 DIAGNOSIS — I779 Disorder of arteries and arterioles, unspecified: Secondary | ICD-10-CM

## 2020-04-16 NOTE — Progress Notes (Signed)
FOLLOW UP  Date of Service/Encounter:  04/16/20   Assessment:   Moderate persistent asthma, uncomplicated   Chronic rhinitis (dust mites, cat, dog, grass, and indoor and outdoor molds) - on allergen immunotherapy  Elevated eosinophils - likely uncontrolled allergies  Complicated past medical history, including kidney disease on dialysis   Plan/Recommendations:   1. Moderate persistent asthma, uncomplicated - Lung testing looked stable. - We are not going to make any medication changes. - Daily controller medication(s): Singulair 10mg  daily only - Prior to physical activity: albuterol 2 puffs 10-15 minutes before physical activity. - Rescue medications: albuterol 4 puffs every 4-6 hours as needed - Asthma control goals:  * Full participation in all desired activities (may need albuterol before activity) * Albuterol use two time or less a week on average (not counting use with activity) * Cough interfering with sleep two time or less a month * Oral steroids no more than once a year * No hospitalizations  2. Chronic rhinitis (dust mites, cat, dog, grass, and indoor and outdoor molds) - Contonue with allergy shots at the same schedule.  - Continue with Xyzal 5mg  daily. - Continue with Singulair 10mg  daily. - Continue with Flonase one spray per nostril twice daily and Astelin one spray per nostril twice daily.   3. Return in about 6 months (around 10/14/2020).    Subjective:   Destiny Day is a 69 y.o. female presenting today for follow up of  Chief Complaint  Patient presents with  . Asthma    doing fine. she does report some repetitive throat clearing which leads to a cough/sneeze. it is not as bad as it used to be.     Destiny Day has a history of the following: Patient Active Problem List   Diagnosis Date Noted  . Secondary hypertension, unspecified 12/13/2019  . Other disorders of phosphorus metabolism 08/24/2019  . Paroxysmal atrial  fibrillation ([place]) 06/13/2019  . Hypocalcemia 04/17/2019  . Old bucket handle tear of lateral meniscus of right knee   . Old peripheral tear of medial meniscus of right knee   . Staphylococcal arthritis, unspecified joint ([place]) 05/11/2018  . Septic arthritis of knee, right ([place]) 05/06/2018  . Degenerative tear of posterior horn of medial meniscus, right   . Old complex tear of lateral meniscus of right knee   . Loose body in knee, right   . 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 End-stage renal disease on hemodialysis ([place])   . Respiratory failure ([place]) 01/21/2018  . Achilles tendon contracture, right 11/09/2017  . Labile blood glucose   . Anemia of infection and chronic disease   . Black stool   . Right sided weakness   . Subdural hemorrhage following injury ([place]) 08/30/2017  . Diabetic peripheral neuropathy ([place])   . Chronic obstructive pulmonary disease ([place])   . Subdural hematoma ([place]) 08/24/2017  . Traumatic subdural hematoma ([place]) 08/23/2017  . Fall   . SDH (subdural hematoma) ([place])   . Acute respiratory failure with hypoxia ([place])   . Diabetes mellitus type 2 in nonobese (HCC)   . Leukocytosis   . Labile blood pressure   . Generalized anxiety disorder   . Debility 08/16/2017  . Amputated great toe of right foot ([place])   . Amputated toe of left foot ([place])   . Post-operative pain   . ESRD on dialysis ([place])   . Diabetes mellitus ([place])   . Anxiety state   . Acute blood  loss anemia   . Diarrhea, unspecified 01/16/2017  . Dependence on renal dialysis ([place]) 01/06/2017  . Idiopathic chronic venous hypertension of both lower extremities with inflammation 10/13/2016  . S/P transmetatarsal amputation of foot, left ([place]) 09/21/2016  . Onychomycosis 08/15/2016  . Unspecified protein-calorie malnutrition ([place]) 07/29/2016  . Other mechanical complication of femoral arterial graft (bypass), initial encounter ([place]) 07/14/2016  . Other pneumonia,  unspecified organism 07/11/2016  . Age-related osteoporosis without current pathological fracture 07/11/2016  . Cervical disc disorder, unspecified, unspecified cervical region 07/11/2016  . Diverticulitis of intestine, part unspecified, without perforation or abscess without bleeding 07/11/2016  . Unspecified abdominal hernia without obstruction or gangrene 07/11/2016  . Gastro-esophageal reflux disease with esophagitis 07/11/2016  . Primary generalized (osteo)arthritis 07/11/2016  . Peripheral vascular disease ([place]) 07/08/2016  . Cellulitis of left toe 06/29/2016  . Atherosclerosis of native artery of left lower extremity with gangrene ([place]) 06/23/2016  . Complication of vascular dialysis catheter 02/29/2016  . Encounter for screening for other viral diseases 01/10/2016  . Encounter for removal of sutures 10/29/2015  . Other fluid overload 10/23/2015  . Anemia in chronic kidney disease 10/16/2015  . Essential (primary) hypertension 10/16/2015  . Acute pulmonary edema ([place]) 10/16/2015  . Coagulation defect, unspecified ([place]) 10/16/2015  . Acute respiratory failure ([place]) 10/16/2015  . Hyperlipidemia, unspecified 10/16/2015  . Iron deficiency anemia, unspecified 10/16/2015  . Pain, unspecified 10/16/2015  . Pruritus, unspecified 10/16/2015  . Secondary hyperparathyroidism of renal origin ([place]) 10/16/2015  . Shortness of breath 10/16/2015  . GERD (gastroesophageal reflux disease) 10/07/2015  . ARF (acute renal failure) ([place])   . Hypothermia 06/12/2015  . End stage renal disease ([place]) 06/12/2015  . Unspecified diastolic (congestive) heart failure ([place]) 07/30/2014  . CKD (chronic kidney disease) stage 4, GFR 15-29 ml/min (HCC) 06/27/2014  . Hypertensive heart disease 05/25/2013  . CKD (chronic kidney disease) stage 3, GFR 30-59 ml/min 05/25/2013  . Pulmonary edema 05/23/2013  . Type 2 diabetes mellitus with diabetic chronic kidney disease ([place]) 05/23/2013  . Type 2 diabetes mellitus  with hyperosmolar nonketotic hyperglycemia ([place]) 04/13/2013  . Acute on chronic diastolic heart failure ([place]) 04/13/2013  . Gastrointestinal hemorrhage, unspecified 03/31/2013  . Hypokalemia 08/24/2012  . Type II or unspecified type diabetes mellitus without mention of complication, not stated as uncontrolled 12/27/2007  . Hyperlipidemia associated with type 2 diabetes mellitus ([place]) 12/27/2007  . Allergic rhinitis due to pollen 12/27/2007  . Unspecified asthma, uncomplicated 42/70/6237    History obtained from: chart review and patient.  Destiny Day is a 69 y.o. female presenting for a follow up visit. She was last seen in June 2021. At that time, her lung testing was still not very stellar at all. We continued with Singulair 10mg  daily as well as albuterol as needed. For her rhinitis, we did intradermal testing that demonstrated sensitization to an outdoor mold mix as well as cat.  We continued with Flonase 1 spray per nostril twice daily and Astelin 1 spray per nostril twice daily.  She also made the decision to start allergen immunotherapy.  In the interim, she has done very well.  Asthma/Respiratory Symptom History: She reports her asthma is well controlled. Tuleen's asthma has been well controlled. She has not required rescue medication, experienced nocturnal awakenings due to lower respiratory symptoms, nor have activities of daily living been limited. She has required no Emergency Department or Urgent Care visits for her asthma. She has required zero courses of systemic steroids for asthma exacerbations since the last  visit. ACT score today is 24, indicating excellent asthma symptom control. Her spirometry is never really consistent with her reported symptoms. She has been on controllers in the past, but they were all too expensive for her and she never took them.   Allergic Rhinitis Symptom History: She changed from the Xyal back to cetirizine instead since this seemed to work better. She  is using the nasal sprays as directed. She has not needed any antibiotics at all. Overall she feels better on the shots compared to not being on the shots.    Cleopatra is on allergen immunotherapy. She receives two injections. Immunotherapy script #1 contains grasses, cat and dog. She currently receives 0.17mL of the GOLD vial (1/10,000). Immunotherapy script #2 contains molds and dust mites. She currently receives 0.33mL of the GOLD vial (1/10,000). She started shots July of 2021 and not yet reached maintenance.   Dialysis is going well. She goes three days a week for around four hours. 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   Otherwise, there have been no changes to her past medical history, surgical history, family history, or social history.    Review of Systems  Constitutional: Negative.  Negative for chills, fever, malaise/fatigue and weight loss.  HENT: Positive for congestion and sinus pain. Negative for ear discharge and ear pain.   Eyes: Negative for pain, discharge and redness.  Respiratory: Negative for cough, sputum production, shortness of breath and wheezing.   Cardiovascular: Negative.  Negative for chest pain and palpitations.  Gastrointestinal: Negative for abdominal pain, constipation, diarrhea, heartburn, nausea and vomiting.  Skin: Negative.  Negative for itching and rash.  Neurological: Negative for dizziness and headaches.  Endo/Heme/Allergies: Positive for environmental allergies. Does not bruise/bleed easily.       Objective:   Blood pressure 110/68, pulse 88, resp. rate 18, SpO2 97 %. There is no height or weight on file to calculate BMI.   Physical Exam:  Physical Exam Constitutional:      Appearance: She is well-developed.     Comments: Boisterous and interactive.   HENT:     Head: Normocephalic and atraumatic.     Right Ear: Tympanic membrane, ear canal and external ear  normal.     Left Ear: Tympanic membrane, ear canal and external ear normal.     Nose: No nasal deformity, septal deviation, mucosal edema or rhinorrhea.     Right Turbinates: Enlarged and swollen.     Left Turbinates: Enlarged and swollen.     Right Sinus: No maxillary sinus tenderness or frontal sinus tenderness.     Left Sinus: No maxillary sinus tenderness or frontal sinus tenderness.     Mouth/Throat:     Mouth: Mucous membranes are not pale and not dry.     Pharynx: Uvula midline.     Comments: Cobblestoning present in the posterior oropharynx.  Eyes:     General:        Right eye: No discharge.        Left eye: No discharge.     Conjunctiva/sclera: Conjunctivae normal.     Right eye: Right conjunctiva is not injected. No chemosis.    Left eye: Left conjunctiva is not injected. No chemosis.    Pupils: Pupils are equal, round, and reactive to light.  Cardiovascular:     Rate and Rhythm: Normal rate and regular rhythm.     Heart sounds: Normal heart sounds.  Pulmonary:  Effort: Pulmonary effort is normal. No tachypnea, accessory muscle usage or respiratory distress.     Breath sounds: Normal breath sounds. No wheezing, rhonchi or rales.     Comments: Moving air well in all lung fields. No increased work of breathing noted.  Chest:     Chest wall: No tenderness.  Lymphadenopathy:     Cervical: No cervical adenopathy.  Skin:    Coloration: Skin is not pale.     Findings: No abrasion, erythema, petechiae or rash. Rash is not papular, urticarial or vesicular.     Comments: No eczematous or urticarial lesions noted today.  Neurological:     Mental Status: She is alert.  Psychiatric:        Behavior: Behavior is cooperative.      Diagnostic studies:    Spirometry: results abnormal and largely unchanged from previous spirometries.         Salvatore Marvel, MD  [facility]

## 2020-04-16 NOTE — Patient Instructions (Addendum)
1. Moderate persistent asthma, uncomplicated - Lung testing looked stable. - We are not going to make any medication changes. - Daily controller medication(s): Singulair 10mg  daily only - Prior to physical activity: albuterol 2 puffs 10-15 minutes before physical activity. - Rescue medications: albuterol 4 puffs every 4-6 hours as needed - Asthma control goals:  * Full participation in all desired activities (may need albuterol before activity) * Albuterol use two time or less a week on average (not counting use with activity) * Cough interfering with sleep two time or less a month * Oral steroids no more than once a year * No hospitalizations  2. Chronic rhinitis (dust mites, cat, dog, grass, and indoor and outdoor molds) - Contonue with allergy shots at the same schedule.  - Continue with Xyzal 5mg  daily. - Continue with Singulair 10mg  daily. - Continue with Flonase one spray per nostril twice daily and Astelin one spray per nostril twice daily.   3. Return in about 6 months (around 10/14/2020).    Please inform us of any Emergency Department visits, hospitalizations, or changes in symptoms. Call us before going to the ED for breathing or allergy symptoms since we might be able to fit you in for a sick visit. Feel free to contact us anytime with any questions, problems, or concerns.  It was a pleasure to see you again today!  Websites that have reliable patient information: 1. American Academy of Asthma, Allergy, and Immunology: www.aaaai.org 2. Food Allergy Research and Education (FARE): foodallergy.org 3. Mothers of Asthmatics: http://www.asthmacommunitynetwork.org 4. American College of Allergy, Asthma, and Immunology: www.acaai.org   COVID-19 Vaccine Information can be found at: ShippingScam.co.uk For questions related to vaccine distribution or appointments, please email [email or web address] or call [phone].      "Like" [place] on Facebook and Instagram for our latest updates!        Make sure you are registered to vote! If you have moved or changed any of your contact information, you will need to get this updated before voting!  In some cases, you MAY be able to register to vote online: [email or web address]

## 2020-04-17 ENCOUNTER — Encounter: Payer: Self-pay | Admitting: Allergy & Immunology

## 2020-04-17 DIAGNOSIS — Z23 Encounter for immunization: Secondary | ICD-10-CM | POA: Diagnosis not present

## 2020-04-17 DIAGNOSIS — D631 Anemia in chronic kidney disease: Secondary | ICD-10-CM | POA: Diagnosis not present

## 2020-04-17 DIAGNOSIS — N186 End stage renal disease: Secondary | ICD-10-CM | POA: Diagnosis not present

## 2020-04-17 DIAGNOSIS — E876 Hypokalemia: Secondary | ICD-10-CM | POA: Diagnosis not present

## 2020-04-17 DIAGNOSIS — Z992 Dependence on renal dialysis: Secondary | ICD-10-CM | POA: Diagnosis not present

## 2020-04-17 DIAGNOSIS — N2581 Secondary hyperparathyroidism of renal origin: Secondary | ICD-10-CM | POA: Diagnosis not present

## 2020-04-18 ENCOUNTER — Encounter (INDEPENDENT_AMBULATORY_CARE_PROVIDER_SITE_OTHER): Payer: Medicare Other | Admitting: Ophthalmology

## 2020-04-19 DIAGNOSIS — D631 Anemia in chronic kidney disease: Secondary | ICD-10-CM | POA: Diagnosis not present

## 2020-04-19 DIAGNOSIS — Z23 Encounter for immunization: Secondary | ICD-10-CM | POA: Diagnosis not present

## 2020-04-19 DIAGNOSIS — Z992 Dependence on renal dialysis: Secondary | ICD-10-CM | POA: Diagnosis not present

## 2020-04-19 DIAGNOSIS — N186 End stage renal disease: Secondary | ICD-10-CM | POA: Diagnosis not present

## 2020-04-19 DIAGNOSIS — E876 Hypokalemia: Secondary | ICD-10-CM | POA: Diagnosis not present

## 2020-04-19 DIAGNOSIS — N2581 Secondary hyperparathyroidism of renal origin: Secondary | ICD-10-CM | POA: Diagnosis not present

## 2020-04-22 DIAGNOSIS — E876 Hypokalemia: Secondary | ICD-10-CM | POA: Diagnosis not present

## 2020-04-22 DIAGNOSIS — D631 Anemia in chronic kidney disease: Secondary | ICD-10-CM | POA: Diagnosis not present

## 2020-04-22 DIAGNOSIS — Z992 Dependence on renal dialysis: Secondary | ICD-10-CM | POA: Diagnosis not present

## 2020-04-22 DIAGNOSIS — Z23 Encounter for immunization: Secondary | ICD-10-CM | POA: Diagnosis not present

## 2020-04-22 DIAGNOSIS — N2581 Secondary hyperparathyroidism of renal origin: Secondary | ICD-10-CM | POA: Diagnosis not present

## 2020-04-22 DIAGNOSIS — N186 End stage renal disease: Secondary | ICD-10-CM | POA: Diagnosis not present

## 2020-04-23 ENCOUNTER — Ambulatory Visit (INDEPENDENT_AMBULATORY_CARE_PROVIDER_SITE_OTHER): Payer: Medicare Other | Admitting: *Deleted

## 2020-04-23 ENCOUNTER — Encounter (INDEPENDENT_AMBULATORY_CARE_PROVIDER_SITE_OTHER): Payer: Self-pay | Admitting: Ophthalmology

## 2020-04-23 ENCOUNTER — Other Ambulatory Visit: Payer: Self-pay

## 2020-04-23 ENCOUNTER — Ambulatory Visit (INDEPENDENT_AMBULATORY_CARE_PROVIDER_SITE_OTHER): Payer: Medicare Other | Admitting: Ophthalmology

## 2020-04-23 DIAGNOSIS — J309 Allergic rhinitis, unspecified: Secondary | ICD-10-CM

## 2020-04-23 DIAGNOSIS — E113521 Type 2 diabetes mellitus with proliferative diabetic retinopathy with traction retinal detachment involving the macula, right eye: Secondary | ICD-10-CM

## 2020-04-23 DIAGNOSIS — Z794 Long term (current) use of insulin: Secondary | ICD-10-CM | POA: Diagnosis not present

## 2020-04-23 DIAGNOSIS — E113512 Type 2 diabetes mellitus with proliferative diabetic retinopathy with macular edema, left eye: Secondary | ICD-10-CM | POA: Insufficient documentation

## 2020-04-23 DIAGNOSIS — H2511 Age-related nuclear cataract, right eye: Secondary | ICD-10-CM

## 2020-04-23 DIAGNOSIS — I779 Disorder of arteries and arterioles, unspecified: Secondary | ICD-10-CM | POA: Diagnosis not present

## 2020-04-23 DIAGNOSIS — E1165 Type 2 diabetes mellitus with hyperglycemia: Secondary | ICD-10-CM | POA: Diagnosis not present

## 2020-04-23 DIAGNOSIS — IMO0002 Reserved for concepts with insufficient information to code with codable children: Secondary | ICD-10-CM

## 2020-04-23 DIAGNOSIS — H2513 Age-related nuclear cataract, bilateral: Secondary | ICD-10-CM

## 2020-04-23 HISTORY — DX: Age-related nuclear cataract, right eye: H25.11

## 2020-04-23 MED ORDER — BEVACIZUMAB CHEMO INJECTION 1.25MG/0.05ML SYRINGE FOR KALEIDOSCOPE
1.2500 mg | INTRAVITREAL | Status: AC | PRN
  Administered 2020-04-23: 1.25 mg via INTRAVITREAL

## 2020-04-23 NOTE — Progress Notes (Signed)
04/23/2020     CHIEF COMPLAINT Patient presents for Retina Evaluation   HISTORY OF PRESENT ILLNESS: Destiny Day is a 69 y.o. female who presents to the clinic today for:   HPI    Retina Evaluation    In both eyes.  This started 2 years ago.  Duration of 2 years.  Context:  distance vision, mid-range vision and near vision.  Response to treatment was no improvement.          Comments    NP exam - Diabetic/PDR OU - Ref'd by Dr. Rexene Edison  Pt c/o blurry VA OU x 2-3 years. Pt c/o floaters OU. Pt denies ocular pain OU. A1c: 6.1, last week LBS: does not check       Last edited by Rockie Neighbours, [place] on 04/23/2020  9:03 AM. (History)      Referring physician: Cipriano Mile, NP [place],  [place] [address]  HISTORICAL INFORMATION:   Selected notes from the MEDICAL RECORD NUMBER    Lab Results  Component Value Date   HGBA1C 5.6 06/26/2019     CURRENT MEDICATIONS: No current outpatient medications on file. (Ophthalmic Drugs)   No current facility-administered medications for this visit. (Ophthalmic Drugs)   Current Outpatient Medications (Other)  Medication Sig  . albuterol (PROAIR HFA) 108 (90 Base) MCG/ACT inhaler 4 puffs every 4-6 hours as needed  . amLODipine (NORVASC) 10 MG tablet Take 10 mg by mouth at bedtime.  Marland Kitchen aspirin EC 81 MG tablet Take 81 mg by mouth daily.  Marland Kitchen atorvastatin (LIPITOR) 40 MG tablet Take 1 tablet (40 mg total) by mouth daily.  Marland Kitchen azelastine (ASTELIN) 0.1 % nasal spray Place 1 spray into both nostrils 2 (two) times daily.  . benzonatate (TESSALON PERLES) 100 MG capsule Take 1 capsule (100 mg total) by mouth 3 (three) times daily as needed for cough.  Marland Kitchen BIDIL 20-37.5 MG tablet TAKE ONE TABLET BY MOUTH THREE TIMES A DAY   . budesonide-formoterol (SYMBICORT) 160-4.5 MCG/ACT inhaler 2 puffs twice daily with spacer  . busPIRone (BUSPAR) 7.5 MG tablet 7.5 mg.   . carvedilol (COREG) 12.5 MG tablet Take 12.5 mg by mouth See admin  instructions. Take 1 tablet (12.5 mg) by mouth once daily in the evening on Mondays, Wednesdays, & Fridays. (Dialysis) Take 1 tablet (12.5 mg) by mouth twice daily on Sundays, Tuesdays, Thursdays, & Saturdays. (Non Dialysis)  . cinacalcet (SENSIPAR) 60 MG tablet Take 90 mg by mouth See admin instructions. Take 1 tablet (90 mg) by mouth in the evening on Mondays, Wednesdays, & Fridays (dialysis) Take 1 tablet (90 mg) by mouth twice daily on Sundays, Tuesdays, Thursdays, Saturdays. (non dialysis)  . diazepam (VALIUM) 5 MG tablet Take 5 mg by mouth 2 (two) times daily as needed for anxiety.  . docusate sodium (COLACE) 100 MG capsule Take 1 capsule (100 mg total) by mouth 2 (two) times daily.  Marland Kitchen EPINEPHrine 0.3 mg/0.3 mL IJ SOAJ injection Inject 0.3 mLs (0.3 mg total) into the muscle as needed for anaphylaxis.  . ferric citrate (AURYXIA) 1 GM 210 MG(Fe) tablet Take 210-420 mg by mouth See admin instructions. Take 2 tablets (420 mg) by mouth with each meal & take 1 tablet (210 mg) by mouth with snacks.  . fluticasone (FLONASE) 50 MCG/ACT nasal spray Place 2 sprays into both nostrils daily as needed for allergies or rhinitis.  Marland Kitchen gabapentin (NEURONTIN) 100 MG capsule TAKE 1 CAPSULE (100 MG TOTAL) BY MOUTH AT BEDTIME. WHEN NECESSARY  FOR NEUROPATHY PAIN  . glipiZIDE (GLUCOTROL) 5 MG tablet Take 5 mg by mouth daily before breakfast.  . guaiFENesin-codeine (ROBITUSSIN AC) 100-10 MG/5ML syrup Take by mouth.  . lidocaine-prilocaine (EMLA) cream Apply 1 application topically 3 (three) times a week. 1-2 hours prior to dialysis  . LOPERAMIDE HCL PO Take by mouth.  . montelukast (SINGULAIR) 10 MG tablet Take 1 tablet (10 mg total) by mouth at bedtime.  . multivitamin (RENA-VIT) TABS tablet Take 1 tablet by mouth daily.   Marland Kitchen omeprazole (PRILOSEC) 20 MG capsule Take 20 mg by mouth daily.   . ondansetron (ZOFRAN) 4 MG tablet Take 1 tablet (4 mg total) by mouth every 6 (six) hours as needed for nausea.  . polyethylene  glycol (MIRALAX / GLYCOLAX) 17 g packet Take 17 g by mouth daily.  . traZODone (DESYREL) 50 MG tablet   . Vitamin D, Ergocalciferol, (DRISDOL) 1.25 MG (50000 UT) CAPS capsule Take 50,000 Units by mouth every Sunday. Takes on Sunday   . XYZAL ALLERGY 24HR 5 MG tablet Take 5 mg by mouth daily.   No current facility-administered medications for this visit. (Other)      REVIEW OF SYSTEMS:    ALLERGIES Allergies  Allergen Reactions  . Adhesive  [Tape] Hives  . Lisinopril Cough  . Prednisone Swelling and Other (See Comments)    Excessive fluid buildup    PAST MEDICAL HISTORY Past Medical History:  Diagnosis Date  . Abdominal bruit   . Anemia   . Anxiety   . Arthritis    Osteoarthritis  . Asthma   . Cervical disc disease    "pinced nerve"  . CHF (congestive heart failure) ([place])   . Complication of anesthesia    " after I got home from my last procedure, I started itching."  . Diabetes mellitus    Type II  . Diverticulitis   . ESRD (end stage renal disease) ([place])    dialysis - M/W/F- [place]  . GERD (gastroesophageal reflux disease)    from medications  . GI bleed 03/31/2013  . Head injury 07/2017  . History of hiatal hernia   . Hyperlipidemia   . Hypertension   . Neuropathy    left leg  . Osteoporosis   . Peripheral vascular disease ([place])   . Pneumonia    "very young" and a few years ago  . PONV (postoperative nausea and vomiting)   . Seasonal allergies   . Shortness of breath dyspnea    WIth exertion, when fluid builds  . Sleep apnea    can't afford cpap    Past Surgical History:  Procedure Laterality Date  . A/V SHUNTOGRAM N/A 09/22/2016   Procedure: A/V Shuntogram - left arm;  Surgeon: Serafina Mitchell, MD;  Location: [facility];  Service: Cardiovascular;  Laterality: N/A;  . A/V SHUNTOGRAM N/A 03/22/2018   Procedure: A/V SHUNTOGRAM - left arm;  Surgeon: Serafina Mitchell, MD;  Location: [facility];  Service: Cardiovascular;  Laterality: N/A;    . A/V SHUNTOGRAM Left 11/17/2018   Procedure: A/V SHUNTOGRAM;  Surgeon: Angelia Mould, MD;  Location: [facility];  Service: Cardiovascular;  Laterality: Left;  . ABDOMINAL HYSTERECTOMY  1993`  . AMPUTATION Left 09/01/2016   Procedure: LEFT FOOT TRANSMETATARSAL AMPUTATION;  Surgeon: Newt Minion, MD;  Location: [facility];  Service: Orthopedics;  Laterality: Left;  . AMPUTATION Right 08/11/2017   Procedure: RIGHT GREAT TOE AMPUTATION DIGIT;  Surgeon: Rosetta Posner, MD;  Location: [facility];  Service: Vascular;  Laterality: Right;  . AMPUTATION Right 12/31/2017   Procedure: RIGHT TRANSMETATARSAL AMPUTATION;  Surgeon: Newt Minion, MD;  Location: [facility];  Service: Orthopedics;  Laterality: Right;  . AV FISTULA PLACEMENT Left 04/21/2016   Procedure: INSERTION OF ARTERIOVENOUS (AV) GORE-TEX GRAFT ARM LEFT;  Surgeon: Elam Dutch, MD;  Location: [facility];  Service: Vascular;  Laterality: Left;  . [place] TRANSPOSITION Left 07/10/2014   Procedure: BASCILIC VEIN TRANSPOSITION;  Surgeon: Angelia Mould, MD;  Location: [facility];  Service: Vascular;  Laterality: Left;  . BASCILIC VEIN TRANSPOSITION Right 11/08/2014   Procedure: FIRST STAGE BASILIC VEIN TRANSPOSITION;  Surgeon: Angelia Mould, MD;  Location: [facility];  Service: Vascular;  Laterality: Right;  . BASCILIC VEIN TRANSPOSITION Right 01/18/2015   Procedure: SECOND STAGE BASILIC VEIN TRANSPOSITION;  Surgeon: Angelia Mould, MD;  Location: [facility];  Service: Vascular;  Laterality: Right;  . CRANIOTOMY N/A 08/23/2017   Procedure: CRANIOTOMY HEMATOMA EVACUATION SUBDURAL;  Surgeon: Ashok Pall, MD;  Location: [facility];  Service: Neurosurgery;  Laterality: N/A;  . CRANIOTOMY Left 08/24/2017   Procedure: CRANIOTOMY FOR RECURRENT ACUTE SUBDURAL HEMATOMA;  Surgeon: Ashok Pall, MD;  Location: [facility];  Service: Neurosurgery;  Laterality: Left;  . ESOPHAGOGASTRODUODENOSCOPY N/A 03/31/2013   Procedure: ESOPHAGOGASTRODUODENOSCOPY (EGD);   Surgeon: Gatha Mayer, MD;  Location[facility];  Service: Endoscopy;  Laterality: N/A;  . EYE SURGERY     laser surgery  . FEMORAL-POPLITEAL BYPASS GRAFT Left 07/08/2016   Procedure: LEFT  FEMORAL-BELOW KNEE POPLITEAL ARTERY BYPASS GRAFT USING 6MM X 80 CM PROPATEN GORETEX GRAFT WITH RINGS.;  Surgeon: Rosetta Posner, MD;  Location: [facility];  Service: Vascular;  Laterality: Left;  . FEMORAL-POPLITEAL BYPASS GRAFT Right 08/11/2017   Procedure: RIGHT FEMORAL TO BELOW KNEE POPLITEAL ARTERKY  BYPASS GRAFT USING 6MM RINGED PROPATEN GRAFT;  Surgeon: Rosetta Posner, MD;  Location: [facility];  Service: Vascular;  Laterality: Right;  . FISTULOGRAM Left 10/29/2014   Procedure: FISTULOGRAM;  Surgeon: Angelia Mould, MD;  Location: [facility];  Service: Cardiovascular;  Laterality: Left;  Marland Kitchen GAS/FLUID EXCHANGE Left 12/20/2018   Procedure: AIR GAS EXCHANGE;  Surgeon: Jalene Mullet, MD;  Location: [facility];  Service: Ophthalmology;  Laterality: Left;  . KNEE ARTHROSCOPY Right 05/06/2018   Procedure: RIGHT KNEE ARTHROSCOPY;  Surgeon: Newt Minion, MD;  Location: [facility];  Service: Orthopedics;  Laterality: Right;  . KNEE ARTHROSCOPY Right 06/10/2018   Procedure: RIGHT KNEE ARTHROSCOPY AND DEBRIDEMENT;  Surgeon: Newt Minion, MD;  Location: [facility];  Service: Orthopedics;  Laterality: Right;  . LIGATION OF ARTERIOVENOUS  FISTULA Left 04/21/2016   Procedure: LIGATION OF ARTERIOVENOUS  FISTULA LEFT ARM;  Surgeon: Elam Dutch, MD;  Location: [place] [facility];  Service: Vascular;  Laterality: Left;  . LOWER EXTREMITY ANGIOGRAPHY N/A 04/06/2017   Procedure: Lower Extremity Angiography - Right;  Surgeon: Serafina Mitchell, MD;  Location: [facility];  Service: Cardiovascular;  Laterality: N/A;  . MEMBRANE PEEL Left 12/20/2018   Procedure: MEMBRANE PEEL;  Surgeon: Jalene Mullet, MD;  Location: [facility];  Service: Ophthalmology;  Laterality: Left;  . PATCH ANGIOPLASTY Right 01/18/2015   Procedure: BASILIC VEIN PATCH  ANGIOPLASTY USING VASCUGUARD PATCH;  Surgeon: Angelia Mould, MD;  Location: [facility];  Service: Vascular;  Laterality: Right;  . PERIPHERAL VASCULAR BALLOON ANGIOPLASTY  09/22/2016   Procedure: Peripheral Vascular Balloon Angioplasty;  Surgeon: Serafina Mitchell, MD;  Location:  [facility];  Service: Cardiovascular;;  Lt. Fistula  . PERIPHERAL VASCULAR BALLOON ANGIOPLASTY Left 03/22/2018   Procedure: PERIPHERAL VASCULAR BALLOON ANGIOPLASTY;  Surgeon: Serafina Mitchell, MD;  Location: [facility];  Service: Cardiovascular;  Laterality: Left;  Arm shunt  . PERIPHERAL VASCULAR CATHETERIZATION N/A 06/23/2016   Procedure: Abdominal Aortogram w/Lower Extremity;  Surgeon: Serafina Mitchell, MD;  Location: [facility];  Service: Cardiovascular;  Laterality: N/A;  . PERIPHERAL VASCULAR CATHETERIZATION  06/23/2016   Procedure: Peripheral Vascular Intervention;  Surgeon: Serafina Mitchell, MD;  Location: [facility];  Service: Cardiovascular;;  lt common and external illiac artery  . PHOTOCOAGULATION WITH LASER Left 12/20/2018   Procedure: PHOTOCOAGULATION WITH LASER;  Surgeon: Jalene Mullet, MD;  Location: [facility];  Service: Ophthalmology;  Laterality: Left;  . 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   Procedure: REPAIR OF COMPLEX TRACTION RETINAL DETACHMENT;  Surgeon: Jalene Mullet, MD;  Location: [facility];  Service: Ophthalmology;  Laterality: Left;    FAMILY HISTORY Family History  Problem Relation Age of Onset  . Other Mother        not sure of cause of death  . Diabetes Father   . Pancreatic cancer Maternal Grandmother   . Diabetes Sister   . Diabetes Sister   . Colon cancer Neg Hx     SOCIAL HISTORY Social History   Tobacco Use  . Smoking status: Former Smoker    Packs/day: 0.35    Years: 40.00    Pack years: 14.00    Types: Cigarettes    Quit date: 05/10/2012    Years since quitting: 7.9  . Smokeless tobacco: Never Used  Vaping Use  .  Vaping Use: Never used  Substance Use Topics  . Alcohol use: No  . Drug use: No    Comment: marijuana; quit in early 1980's         OPHTHALMIC EXAM:  Base Eye Exam    Visual Acuity (ETDRS)      Right Left   Dist cc 20/70 +1 20/400   Dist ph cc NI NI   Correction: Glasses       Tonometry (Tonopen, 9:04 AM)      Right Left   Pressure 05 20       Pupils      Pupils Dark Light Shape React APD   Right PERRL 4 3 Round Brisk None   Left PERRL 4 3 Round Brisk None       Visual Fields (Counting fingers)      Left Right     Full   Restrictions Total superior temporal, inferior temporal, inferior nasal deficiencies        Extraocular Movement      Right Left    Full Full       Neuro/Psych    Oriented x3: Yes   Mood/Affect: Normal       Dilation    Both eyes: 2.5% Phenylephrine, 1.0% Mydriacyl @ 9:08 AM        Slit Lamp and Fundus Exam    External Exam      Right Left   External Normal Normal       Slit Lamp Exam      Right Left   Lids/Lashes Normal Normal   Conjunctiva/Sclera White and quiet White and quiet   Cornea Clear Clear   Anterior Chamber Deep and quiet Deep and quiet   Iris Round and reactive Round and reactive  Lens Nuclear sclerosis 2+ Nuclear sclerosis 3+   Anterior Vitreous Normal Normal       Fundus Exam      Right Left   Posterior Vitreous Vitreous hemorrhage Vitrectomized, by history   Disc Normal No details through cataract   C/D Ratio 0.4    Macula Microaneurysms Poor details through the dense cataract   Vessels PDR-active Good PRP, glimpses   Periphery Moderate scatter panretinal photocoagulation present, Neovascularization active peripherally nearly 360 There is a attached peripherally          IMAGING AND PROCEDURES  Imaging and Procedures for 04/23/20  OCT, Retina - OU - Both Eyes       Right Eye Quality was good. Scan locations included subfoveal. Central Foveal Thickness: 374. Progression has no prior data.  Findings include cystoid macular edema, epiretinal membrane.   Left Eye Quality was borderline. Scan locations included subfoveal. Progression has no prior data. Findings include cystoid macular edema.        Color Fundus Photography Optos - OU - Both Eyes       Right Eye Progression has no prior data. Disc findings include neovascularization. Macula : microaneurysms, edema, epiretinal membrane. Vessels : Neovascularization. Periphery : neovascularization.   Left Eye Progression has no prior data.   Notes OD with extensive peripheral neovascularization elsewhere with proliferative diabetic retinopathy, high risk characteristics and preretinal hemorrhage.  Only mild to moderate scatter photocoagulation noted  OS, dense medial opacity, apparently mostly cataract.  No details of macular optic nerve.  Panretinal photocoagulation appears to be very far anterior.       Intravitreal Injection, Pharmacologic Agent - OS - Left Eye       Time Out 04/23/2020. 10:31 AM. Confirmed correct patient, procedure, site, and patient consented.   Anesthesia Topical anesthesia was used. Anesthetic medications included [place] 3.5%.   Procedure Preparation included 5% betadine to ocular surface, 10% betadine to eyelids. A supplied needle was used.   Injection:  1.25 mg Bevacizumab (AVASTIN) SOLN   NDC: [address]-4   Route: Intravitreal, Site: Left Eye, Waste: 0 mg  Post-op Post injection exam found visual acuity of at least counting fingers. The patient tolerated the procedure well. There were no complications. The patient received written and verbal post procedure care education. Post injection medications were not given.                 ASSESSMENT/PLAN:  Uncontrolled diabetes mellitus with right eye affected by proliferative retinopathy and traction retinal detachment involving macula, with long-term current use of insulin (HCC) OD with vitreal macular traction from epiretinal  membrane and proliferative diabetic retinopathy which is currently not under control.  We will asked the patient to return visit soon, within 1 week for additional PRP to begin the process of bringing the advanced proliferative diabetic retinopathy uncontrolled.  Thus to void this time intravitreal Avastin OD in order to prevent the "crunch" syndrome which could predict lead to a progressive retinal detachment.  Once PRP has been delivered OD, will have patient return within a week or 2 for intravitreal Avastin followed within 48 to 72 hours surgical intervention right eye via vitrectomy only after current cataract has been removed.  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 type 2 diabetes mellitus ([place]) Report and by findings in the epic chart, status post repair of complex retinal detachment.  View clinically is nearly impossible left eye at the OCT does detect clinically significant macular edema center involvement thus  will deliver intravitreal Avastin OS today to render all diabetic retinopathy in the left eye quiescent  This is important such that the patient has a dense cataract in left eye and her best rapid visual acuity improvement is likely to be via cataract extraction with intraocular lens placement in the left eye first  Nuclear sclerotic cataract of both eyes the patient has a dense cataract in left eye and her best rapid visual acuity improvement is likely to be via cataract extraction with intraocular lens placement in the left eye first.  I will asked patient to return promptly to see our [facility] to commence flexion of left eye to recover as much vision as possible and furthermore to allow further delivery of PRP should it be necessary in the left eye  OD, with cataract as well, liver PRP is in this office initially followed thereafter by cataract surgery in the right eye.  Cataract surgery in the right eye is completed,  patient return here whereupon intravitreal Avastin may be delivered 48 to 72 hours prior to planned vitrectomy membrane peel right eye      ICD-10-CM   1. 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 type 2 diabetes mellitus (HCC)  T41.9622 OCT, Retina - OU - Both Eyes    Color Fundus Photography Optos - OU - Both Eyes    Intravitreal Injection, Pharmacologic Agent - OS - Left Eye    Bevacizumab (AVASTIN) SOLN 1.25 mg  3. Nuclear sclerotic cataract of both eyes  H25.13     1.  OS, intravitreal Avastin today to quiet PDR, and CSME in preparation for prompt cataract extraction with intraocular lens placement left eye  2.  OD, follow-up within 1 week to add additional PRP through adequate media clarity to further quiet active PDR and vitreous hemorrhage with high risk characteristics  3.  Follow-up with [facility] for commencement of cataract extraction with intraocular lens placement left eye promptly  4.  I explained the risk and benefits of injecting intravitreal Avastin with the patient and her husband  5.  I explained the following analogies, the [employer] [place] truck fully loaded going [place] the [place], salt water go to the type story as far as blood sugar control.  Also user the story of a weeds going to garden  Ophthalmic Meds Ordered this visit:  Meds ordered this encounter  Medications  . Bevacizumab (AVASTIN) SOLN 1.25 mg       Return in about 1 week (around 04/30/2020), or Or less, for PRP, OD.  There are no Patient Instructions on file for this visit.   Explained the diagnoses, plan, and follow up with the patient and they expressed understanding.   Patient expressed understanding of the importance of proper follow up care.   Clent Demark Nazareth Norenberg M.D. Diseases & Surgery of the Retina and Vitreous Retina & Diabetic [place] 04/23/20     Abbreviations: M myopia (nearsighted); A astigmatism; H hyperopia (farsighted); P presbyopia; Mrx spectacle prescription;  CTL contact lenses; OD right eye; OS left eye; OU both eyes  XT exotropia; ET esotropia; PEK punctate epithelial keratitis; PEE punctate epithelial erosions; DES dry eye syndrome; MGD  meibomian gland dysfunction; ATs artificial tears; PFAT's preservative free artificial tears; [place] nuclear sclerotic cataract; PSC posterior subcapsular cataract; ERM epi-retinal membrane; PVD posterior vitreous detachment; RD retinal detachment; DM diabetes mellitus; DR diabetic retinopathy; NPDR non-proliferative diabetic retinopathy; PDR proliferative diabetic retinopathy; CSME clinically significant macular edema; DME diabetic macular edema; dbh dot blot hemorrhages; CWS cotton wool spot; POAG primary open angle glaucoma; C/D cup-to-disc ratio; HVF humphrey visual field; GVF goldmann visual field; OCT optical coherence tomography; IOP intraocular pressure; BRVO Branch retinal vein occlusion; CRVO central retinal vein occlusion; CRAO central retinal artery occlusion; BRAO branch retinal artery occlusion; RT retinal tear; SB scleral buckle; PPV pars plana vitrectomy; VH Vitreous hemorrhage; PRP panretinal laser photocoagulation; IVK intravitreal kenalog; VMT vitreomacular traction; MH Macular hole;  NVD neovascularization of the disc; NVE neovascularization elsewhere; AREDS age related eye disease study; ARMD age related macular degeneration; POAG primary open angle glaucoma; EBMD epithelial/anterior basement membrane dystrophy; ACIOL anterior chamber intraocular lens; IOL intraocular lens; PCIOL posterior chamber intraocular lens; Phaco/IOL phacoemulsification with intraocular lens placement; [place] photorefractive  keratectomy; LASIK laser assisted in situ keratomileusis; HTN hypertension; DM diabetes mellitus; COPD chronic obstructive pulmonary disease

## 2020-04-23 NOTE — Assessment & Plan Note (Addendum)
the patient has a dense cataract in left eye and her best rapid visual acuity improvement is likely to be via cataract extraction with intraocular lens placement in the left eye first.  I will asked patient to return promptly to see our [facility] to commence flexion of left eye to recover as much vision as possible and furthermore to allow further delivery of PRP should it be necessary in the left eye  OD, with cataract as well, liver PRP is in this office initially followed thereafter by cataract surgery in the right eye.  Cataract surgery in the right eye is completed, patient return here whereupon intravitreal Avastin may be delivered 48 to 72 hours prior to planned vitrectomy membrane peel right eye

## 2020-04-23 NOTE — Assessment & Plan Note (Signed)
Report and by findings in the epic chart, status post repair of complex retinal detachment.  View clinically is nearly impossible left eye at the OCT does detect clinically significant macular edema center involvement thus will deliver intravitreal Avastin OS today to render all diabetic retinopathy in the left eye quiescent  This is important such that the patient has a dense cataract in left eye and her best rapid visual acuity improvement is likely to be via cataract extraction with intraocular lens placement in the left eye first

## 2020-04-23 NOTE — Assessment & Plan Note (Signed)
OD with vitreal macular traction from epiretinal membrane and proliferative diabetic retinopathy which is currently not under control.  We will asked the patient to return visit soon, within 1 week for additional PRP to begin the process of bringing the advanced proliferative diabetic retinopathy uncontrolled.  Thus to void this time intravitreal Avastin OD in order to prevent the "crunch" syndrome which could predict lead to a progressive retinal detachment.  Once PRP has been delivered OD, will have patient return within a week or 2 for intravitreal Avastin followed within 48 to 72 hours surgical intervention right eye via vitrectomy only after current cataract has been removed.

## 2020-04-24 DIAGNOSIS — N186 End stage renal disease: Secondary | ICD-10-CM | POA: Diagnosis not present

## 2020-04-24 DIAGNOSIS — D631 Anemia in chronic kidney disease: Secondary | ICD-10-CM | POA: Diagnosis not present

## 2020-04-24 DIAGNOSIS — E876 Hypokalemia: Secondary | ICD-10-CM | POA: Diagnosis not present

## 2020-04-24 DIAGNOSIS — Z992 Dependence on renal dialysis: Secondary | ICD-10-CM | POA: Diagnosis not present

## 2020-04-24 DIAGNOSIS — Z23 Encounter for immunization: Secondary | ICD-10-CM | POA: Diagnosis not present

## 2020-04-24 DIAGNOSIS — N2581 Secondary hyperparathyroidism of renal origin: Secondary | ICD-10-CM | POA: Diagnosis not present

## 2020-04-26 ENCOUNTER — Other Ambulatory Visit: Payer: Self-pay | Admitting: *Deleted

## 2020-04-26 DIAGNOSIS — D631 Anemia in chronic kidney disease: Secondary | ICD-10-CM | POA: Diagnosis not present

## 2020-04-26 DIAGNOSIS — D509 Iron deficiency anemia, unspecified: Secondary | ICD-10-CM | POA: Diagnosis not present

## 2020-04-26 DIAGNOSIS — N186 End stage renal disease: Secondary | ICD-10-CM | POA: Diagnosis not present

## 2020-04-26 DIAGNOSIS — N2581 Secondary hyperparathyroidism of renal origin: Secondary | ICD-10-CM | POA: Diagnosis not present

## 2020-04-26 DIAGNOSIS — Z992 Dependence on renal dialysis: Secondary | ICD-10-CM | POA: Diagnosis not present

## 2020-04-26 DIAGNOSIS — E876 Hypokalemia: Secondary | ICD-10-CM | POA: Diagnosis not present

## 2020-04-26 DIAGNOSIS — I159 Secondary hypertension, unspecified: Secondary | ICD-10-CM | POA: Diagnosis not present

## 2020-04-26 NOTE — Patient Outreach (Signed)
[facility]) Care Management  04/26/2020  Destiny Day 07/02/1951 [id number]  Unsuccessful outreach attempt made to patient. Patient answered the call and stated she was currently at a doctor's appointment and could not speak today. She asked to be called back at a later date.  Plan: RN Health Coach will call patient within the month of November  [place], [place] [phone] [name]

## 2020-04-29 DIAGNOSIS — E876 Hypokalemia: Secondary | ICD-10-CM | POA: Diagnosis not present

## 2020-04-29 DIAGNOSIS — N186 End stage renal disease: Secondary | ICD-10-CM | POA: Diagnosis not present

## 2020-04-29 DIAGNOSIS — D509 Iron deficiency anemia, unspecified: Secondary | ICD-10-CM | POA: Diagnosis not present

## 2020-04-29 DIAGNOSIS — Z992 Dependence on renal dialysis: Secondary | ICD-10-CM | POA: Diagnosis not present

## 2020-04-29 DIAGNOSIS — D631 Anemia in chronic kidney disease: Secondary | ICD-10-CM | POA: Diagnosis not present

## 2020-04-29 DIAGNOSIS — N2581 Secondary hyperparathyroidism of renal origin: Secondary | ICD-10-CM | POA: Diagnosis not present

## 2020-04-30 ENCOUNTER — Other Ambulatory Visit: Payer: Self-pay

## 2020-04-30 ENCOUNTER — Ambulatory Visit (INDEPENDENT_AMBULATORY_CARE_PROVIDER_SITE_OTHER): Payer: Medicare Other | Admitting: *Deleted

## 2020-04-30 ENCOUNTER — Ambulatory Visit (INDEPENDENT_AMBULATORY_CARE_PROVIDER_SITE_OTHER): Payer: Medicare Other | Admitting: Ophthalmology

## 2020-04-30 ENCOUNTER — Encounter (INDEPENDENT_AMBULATORY_CARE_PROVIDER_SITE_OTHER): Payer: Self-pay | Admitting: Ophthalmology

## 2020-04-30 DIAGNOSIS — IMO0002 Reserved for concepts with insufficient information to code with codable children: Secondary | ICD-10-CM

## 2020-04-30 DIAGNOSIS — E1165 Type 2 diabetes mellitus with hyperglycemia: Secondary | ICD-10-CM

## 2020-04-30 DIAGNOSIS — E113521 Type 2 diabetes mellitus with proliferative diabetic retinopathy with traction retinal detachment involving the macula, right eye: Secondary | ICD-10-CM

## 2020-04-30 DIAGNOSIS — E113512 Type 2 diabetes mellitus with proliferative diabetic retinopathy with macular edema, left eye: Secondary | ICD-10-CM

## 2020-04-30 DIAGNOSIS — J309 Allergic rhinitis, unspecified: Secondary | ICD-10-CM | POA: Diagnosis not present

## 2020-04-30 DIAGNOSIS — Z794 Long term (current) use of insulin: Secondary | ICD-10-CM

## 2020-04-30 NOTE — Assessment & Plan Note (Signed)
PRP applied today, 360

## 2020-04-30 NOTE — Assessment & Plan Note (Signed)
Dense vitreous hemorrhage, left eye, nonclearing post Avastin.   visit in 2 weeks and likely will schedule vitrectomy and endolaser left eye next

## 2020-04-30 NOTE — Progress Notes (Signed)
04/30/2020     CHIEF COMPLAINT Patient presents for Retina Follow Up   HISTORY OF PRESENT ILLNESS: Destiny Day is a 69 y.o. female who presents to the clinic today for:   HPI    Retina Follow Up    Patient presents with  Diabetic Retinopathy.  In right eye.  This started 1 week ago.  Severity is moderate.  Duration of 1 week.  Since onset it is stable.          Comments    1 Week PRP OD  Pt denies noticeable changes to [place] OU since last visit. Pt denies ocular pain, flashes of light, or floaters OU.  LBS: did not check       Last edited by Rockie Neighbours, [place] on 04/30/2020  3:02 PM. (History)      Referring physician: Cipriano Mile, NP [place],  [place] [address]  HISTORICAL INFORMATION:   Selected notes from the MEDICAL RECORD NUMBER    Lab Results  Component Value Date   HGBA1C 5.6 06/26/2019     CURRENT MEDICATIONS: No current outpatient medications on file. (Ophthalmic Drugs)   No current facility-administered medications for this visit. (Ophthalmic Drugs)   Current Outpatient Medications (Other)  Medication Sig  . albuterol (PROAIR HFA) 108 (90 Base) MCG/ACT inhaler 4 puffs every 4-6 hours as needed  . amLODipine (NORVASC) 10 MG tablet Take 10 mg by mouth at bedtime.  Marland Kitchen aspirin EC 81 MG tablet Take 81 mg by mouth daily.  Marland Kitchen atorvastatin (LIPITOR) 40 MG tablet Take 1 tablet (40 mg total) by mouth daily.  Marland Kitchen azelastine (ASTELIN) 0.1 % nasal spray Place 1 spray into both nostrils 2 (two) times daily.  . benzonatate (TESSALON PERLES) 100 MG capsule Take 1 capsule (100 mg total) by mouth 3 (three) times daily as needed for cough.  Marland Kitchen BIDIL 20-37.5 MG tablet TAKE ONE TABLET BY MOUTH THREE TIMES A DAY   . budesonide-formoterol (SYMBICORT) 160-4.5 MCG/ACT inhaler 2 puffs twice daily with spacer  . busPIRone (BUSPAR) 7.5 MG tablet 7.5 mg.   . carvedilol (COREG) 12.5 MG tablet Take 12.5 mg by mouth See admin instructions. Take 1 tablet (12.5 mg)  by mouth once daily in the evening on Mondays, Wednesdays, & Fridays. (Dialysis) Take 1 tablet (12.5 mg) by mouth twice daily on Sundays, Tuesdays, Thursdays, & Saturdays. (Non Dialysis)  . cinacalcet (SENSIPAR) 60 MG tablet Take 90 mg by mouth See admin instructions. Take 1 tablet (90 mg) by mouth in the evening on Mondays, Wednesdays, & Fridays (dialysis) Take 1 tablet (90 mg) by mouth twice daily on Sundays, Tuesdays, Thursdays, Saturdays. (non dialysis)  . diazepam (VALIUM) 5 MG tablet Take 5 mg by mouth 2 (two) times daily as needed for anxiety.  . docusate sodium (COLACE) 100 MG capsule Take 1 capsule (100 mg total) by mouth 2 (two) times daily.  Marland Kitchen EPINEPHrine 0.3 mg/0.3 mL IJ SOAJ injection Inject 0.3 mLs (0.3 mg total) into the muscle as needed for anaphylaxis.  . ferric citrate (AURYXIA) 1 GM 210 MG(Fe) tablet Take 210-420 mg by mouth See admin instructions. Take 2 tablets (420 mg) by mouth with each meal & take 1 tablet (210 mg) by mouth with snacks.  . fluticasone (FLONASE) 50 MCG/ACT nasal spray Place 2 sprays into both nostrils daily as needed for allergies or rhinitis.  Marland Kitchen gabapentin (NEURONTIN) 100 MG capsule TAKE 1 CAPSULE (100 MG TOTAL) BY MOUTH AT BEDTIME. WHEN NECESSARY FOR NEUROPATHY PAIN  .  glipiZIDE (GLUCOTROL) 5 MG tablet Take 5 mg by mouth daily before breakfast.  . guaiFENesin-codeine (ROBITUSSIN AC) 100-10 MG/5ML syrup Take by mouth.  . lidocaine-prilocaine (EMLA) cream Apply 1 application topically 3 (three) times a week. 1-2 hours prior to dialysis  . LOPERAMIDE HCL PO Take by mouth.  . montelukast (SINGULAIR) 10 MG tablet Take 1 tablet (10 mg total) by mouth at bedtime.  . multivitamin (RENA-VIT) TABS tablet Take 1 tablet by mouth daily.   Marland Kitchen omeprazole (PRILOSEC) 20 MG capsule Take 20 mg by mouth daily.   . ondansetron (ZOFRAN) 4 MG tablet Take 1 tablet (4 mg total) by mouth every 6 (six) hours as needed for nausea.  . polyethylene glycol (MIRALAX / GLYCOLAX) 17 g packet  Take 17 g by mouth daily.  . traZODone (DESYREL) 50 MG tablet   . Vitamin D, Ergocalciferol, (DRISDOL) 1.25 MG (50000 UT) CAPS capsule Take 50,000 Units by mouth every Sunday. Takes on Sunday   . XYZAL ALLERGY 24HR 5 MG tablet Take 5 mg by mouth daily.   No current facility-administered medications for this visit. (Other)      REVIEW OF SYSTEMS:    ALLERGIES Allergies  Allergen Reactions  . Adhesive  [Tape] Hives  . Lisinopril Cough  . Prednisone Swelling and Other (See Comments)    Excessive fluid buildup    PAST MEDICAL HISTORY Past Medical History:  Diagnosis Date  . Abdominal bruit   . Anemia   . Anxiety   . Arthritis    Osteoarthritis  . Asthma   . Cervical disc disease    "pinced nerve"  . CHF (congestive heart failure) ([place])   . Complication of anesthesia    " after I got home from my last procedure, I started itching."  . Diabetes mellitus    Type II  . Diverticulitis   . ESRD (end stage renal disease) ([place])    dialysis - M/W/F- [place]  . GERD (gastroesophageal reflux disease)    from medications  . GI bleed 03/31/2013  . Head injury 07/2017  . History of hiatal hernia   . Hyperlipidemia   . Hypertension   . Neuropathy    left leg  . Osteoporosis   . Peripheral vascular disease ([place])   . Pneumonia    "very young" and a few years ago  . PONV (postoperative nausea and vomiting)   . Seasonal allergies   . Shortness of breath dyspnea    WIth exertion, when fluid builds  . Sleep apnea    can't afford cpap    Past Surgical History:  Procedure Laterality Date  . A/V SHUNTOGRAM N/A 09/22/2016   Procedure: A/V Shuntogram - left arm;  Surgeon: Serafina Mitchell, MD;  Location: [place] [facility];  Service: Cardiovascular;  Laterality: N/A;  . A/V SHUNTOGRAM N/A 03/22/2018   Procedure: A/V SHUNTOGRAM - left arm;  Surgeon: Serafina Mitchell, MD;  Location: [place] [facility];  Service: Cardiovascular;  Laterality: N/A;  . A/V SHUNTOGRAM Left 11/17/2018    Procedure: A/V SHUNTOGRAM;  Surgeon: Angelia Mould, MD;  Location: [place] [facility];  Service: Cardiovascular;  Laterality: Left;  . ABDOMINAL HYSTERECTOMY  1993`  . AMPUTATION Left 09/01/2016   Procedure: LEFT FOOT TRANSMETATARSAL AMPUTATION;  Surgeon: Newt Minion, MD;  Location: [facility];  Service: Orthopedics;  Laterality: Left;  . AMPUTATION Right 08/11/2017   Procedure: RIGHT GREAT TOE AMPUTATION DIGIT;  Surgeon: Rosetta Posner, MD;  Location: [place];  Service:  Vascular;  Laterality: Right;  . AMPUTATION Right 12/31/2017   Procedure: RIGHT TRANSMETATARSAL AMPUTATION;  Surgeon: Newt Minion, MD;  Location: [facility];  Service: Orthopedics;  Laterality: Right;  . AV FISTULA PLACEMENT Left 04/21/2016   Procedure: INSERTION OF ARTERIOVENOUS (AV) GORE-TEX GRAFT ARM LEFT;  Surgeon: Elam Dutch, MD;  Location: [facility];  Service: Vascular;  Laterality: Left;  . [place] TRANSPOSITION Left 07/10/2014   Procedure: BASCILIC VEIN TRANSPOSITION;  Surgeon: Angelia Mould, MD;  Location: [place];  Service: Vascular;  Laterality: Left;  . BASCILIC VEIN TRANSPOSITION Right 11/08/2014   Procedure: FIRST STAGE BASILIC VEIN TRANSPOSITION;  Surgeon: Angelia Mould, MD;  Location: [facility];  Service: Vascular;  Laterality: Right;  . BASCILIC VEIN TRANSPOSITION Right 01/18/2015   Procedure: SECOND STAGE BASILIC VEIN TRANSPOSITION;  Surgeon: Angelia Mould, MD;  Location: [place];  Service: Vascular;  Laterality: Right;  . CRANIOTOMY N/A 08/23/2017   Procedure: CRANIOTOMY HEMATOMA EVACUATION SUBDURAL;  Surgeon: Ashok Pall, MD;  Location: [facility];  Service: Neurosurgery;  Laterality: N/A;  . CRANIOTOMY Left 08/24/2017   Procedure: CRANIOTOMY FOR RECURRENT ACUTE SUBDURAL HEMATOMA;  Surgeon: Ashok Pall, MD;  Location: [facility];  Service: Neurosurgery;  Laterality: Left;  . ESOPHAGOGASTRODUODENOSCOPY N/A 03/31/2013   Procedure: ESOPHAGOGASTRODUODENOSCOPY (EGD);  Surgeon: Gatha Mayer, MD;  Location:  [facility];  Service: Endoscopy;  Laterality: N/A;  . EYE SURGERY     laser surgery  . FEMORAL-POPLITEAL BYPASS GRAFT Left 07/08/2016   Procedure: LEFT  FEMORAL-BELOW KNEE POPLITEAL ARTERY BYPASS GRAFT USING 6MM X 80 CM PROPATEN GORETEX GRAFT WITH RINGS.;  Surgeon: Rosetta Posner, MD;  Location: [place];  Service: Vascular;  Laterality: Left;  . FEMORAL-POPLITEAL BYPASS GRAFT Right 08/11/2017   Procedure: RIGHT FEMORAL TO BELOW KNEE POPLITEAL ARTERKY  BYPASS GRAFT USING 6MM RINGED PROPATEN GRAFT;  Surgeon: Rosetta Posner, MD;  Location: [facility];  Service: Vascular;  Laterality: Right;  . FISTULOGRAM Left 10/29/2014   Procedure: FISTULOGRAM;  Surgeon: Angelia Mould, MD;  Location: [facility];  Service: Cardiovascular;  Laterality: Left;  Marland Kitchen GAS/FLUID EXCHANGE Left 12/20/2018   Procedure: AIR GAS EXCHANGE;  Surgeon: Jalene Mullet, MD;  Location: [place];  Service: Ophthalmology;  Laterality: Left;  . KNEE ARTHROSCOPY Right 05/06/2018   Procedure: RIGHT KNEE ARTHROSCOPY;  Surgeon: Newt Minion, MD;  Location: [facility];  Service: Orthopedics;  Laterality: Right;  . KNEE ARTHROSCOPY Right 06/10/2018   Procedure: RIGHT KNEE ARTHROSCOPY AND DEBRIDEMENT;  Surgeon: Newt Minion, MD;  Location: [place];  Service: Orthopedics;  Laterality: Right;  . LIGATION OF ARTERIOVENOUS  FISTULA Left 04/21/2016   Procedure: LIGATION OF ARTERIOVENOUS  FISTULA LEFT ARM;  Surgeon: Elam Dutch, MD;  Location: [place];  Service: Vascular;  Laterality: Left;  . LOWER EXTREMITY ANGIOGRAPHY N/A 04/06/2017   Procedure: Lower Extremity Angiography - Right;  Surgeon: Serafina Mitchell, MD;  Location: [place] [facility];  Service: Cardiovascular;  Laterality: N/A;  . MEMBRANE PEEL Left 12/20/2018   Procedure: MEMBRANE PEEL;  Surgeon: Jalene Mullet, MD;  Location: [place];  Service: Ophthalmology;  Laterality: Left;  . PATCH ANGIOPLASTY Right 01/18/2015   Procedure: BASILIC VEIN PATCH ANGIOPLASTY USING VASCUGUARD PATCH;  Surgeon:  Angelia Mould, MD;  Location: [facility];  Service: Vascular;  Laterality: Right;  . PERIPHERAL VASCULAR BALLOON ANGIOPLASTY  09/22/2016   Procedure: Peripheral Vascular Balloon Angioplasty;  Surgeon: Serafina Mitchell, MD;  Location: [place] [facility];  Service: Cardiovascular;;  Lt. Fistula  . PERIPHERAL VASCULAR BALLOON ANGIOPLASTY Left 03/22/2018   Procedure: PERIPHERAL VASCULAR BALLOON ANGIOPLASTY;  Surgeon: Serafina Mitchell, MD;  Location: [place] [facility];  Service: Cardiovascular;  Laterality: Left;  Arm shunt  . PERIPHERAL VASCULAR CATHETERIZATION N/A 06/23/2016   Procedure: Abdominal Aortogram w/Lower Extremity;  Surgeon: Serafina Mitchell, MD;  Location: [place] [facility];  Service: Cardiovascular;  Laterality: N/A;  . PERIPHERAL VASCULAR CATHETERIZATION  06/23/2016   Procedure: Peripheral Vascular Intervention;  Surgeon: Serafina Mitchell, MD;  Location: [place] [facility];  Service: Cardiovascular;;  lt common and external illiac artery  . PHOTOCOAGULATION WITH LASER Left 12/20/2018   Procedure: PHOTOCOAGULATION WITH LASER;  Surgeon: Jalene Mullet, MD;  Location: [facility];  Service: Ophthalmology;  Laterality: Left;  . 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   Procedure: REPAIR OF COMPLEX TRACTION RETINAL DETACHMENT;  Surgeon: Jalene Mullet, MD;  Location: [place];  Service: Ophthalmology;  Laterality: Left;    FAMILY HISTORY Family History  Problem Relation Age of Onset  . Other Mother        not sure of cause of death  . Diabetes Father   . Pancreatic cancer Maternal Grandmother   . Diabetes Sister   . Diabetes Sister   . Colon cancer Neg Hx     SOCIAL HISTORY Social History   Tobacco Use  . Smoking status: Former Smoker    Packs/day: 0.35    Years: 40.00    Pack years: 14.00    Types: Cigarettes    Quit date: 05/10/2012    Years since quitting: 7.9  . Smokeless tobacco: Never Used  Vaping Use  . Vaping Use: Never used  Substance Use Topics    . Alcohol use: No  . Drug use: No    Comment: marijuana; quit in early 1980's         OPHTHALMIC EXAM:  Base Eye Exam    Visual Acuity (ETDRS)      Right Left   Dist cc 20/50 -1 20/400   Dist ph cc NI NI   Correction: Glasses       Tonometry (Tonopen, 3:02 PM)      Right Left   Pressure 20 21       Pupils      Pupils Dark Light Shape React APD   Right PERRL 4 3 Round Brisk None   Left PERRL 4 3 Round Brisk None       Visual Fields (Counting fingers)      Left Right     Full   Restrictions Total superior temporal, inferior temporal, inferior nasal deficiencies        Extraocular Movement      Right Left    Full Full       Neuro/Psych    Oriented x3: Yes   Mood/Affect: Normal       Dilation    Right eye: 1.0% Mydriacyl, 2.5% Phenylephrine @ 3:06 PM          IMAGING AND PROCEDURES  Imaging and Procedures for 04/30/20           ASSESSMENT/PLAN:  Uncontrolled diabetes mellitus with right eye affected by proliferative retinopathy and traction retinal detachment involving macula, with long-term current use of insulin (HCC) PRP applied today, 360  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   visit in 2  weeks and likely will schedule vitrectomy and endolaser left eye next      ICD-10-CM   1. Uncontrolled diabetes mellitus with right eye affected by proliferative retinopathy and traction retinal detachment involving macula, with long-term current use of insulin (HCC)  T53.2023 Panretinal Photocoagulation - OD - Right Eye   E11.65    Z79.4   2. 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  PRP completed OD to induce further quiescent's of retinopathy and stabilize the areas of retinal nonperfusion.  There are media.  We will continue  to monitor  2.  Next visit dilate left eye, likely B-scan and likely preparation for surgical intervention left eye via vitrectomy and endolaser to recover acuity  3.  Ophthalmic Meds Ordered this visit:  No orders of the defined types were placed in this encounter.      Return in about 2 weeks (around 05/14/2020) for dilate, OS, B-Scan U/S, likely preop for vitrectomy left.  There are no Patient Instructions on file for this visit.   Explained the diagnoses, plan, and follow up with the patient and they expressed understanding.  Patient expressed understanding of the importance of proper follow up care.   Clent Demark Halyn Flaugher M.D. Diseases & Surgery of the Retina and Vitreous Retina & Diabetic [place] 04/30/20     Abbreviations: M myopia (nearsighted); A astigmatism; H hyperopia (farsighted); P presbyopia; Mrx spectacle prescription;  CTL contact lenses; OD right eye; OS left eye; OU both eyes  XT exotropia; ET esotropia; PEK punctate epithelial keratitis; PEE punctate epithelial erosions; DES dry eye syndrome; MGD meibomian gland dysfunction; ATs artificial tears; PFAT's preservative free artificial tears; [place] nuclear sclerotic cataract; PSC posterior subcapsular cataract; ERM epi-retinal membrane; PVD posterior vitreous detachment; RD retinal detachment; DM diabetes mellitus; DR diabetic retinopathy; NPDR non-proliferative diabetic retinopathy; PDR proliferative diabetic retinopathy; CSME clinically significant macular edema; DME diabetic macular edema; dbh dot blot hemorrhages; CWS cotton wool spot; POAG primary open angle glaucoma; C/D cup-to-disc ratio; HVF humphrey visual field; GVF goldmann visual field; OCT optical coherence tomography; IOP intraocular pressure; BRVO Branch retinal vein occlusion; CRVO central retinal vein occlusion; CRAO central retinal artery occlusion; BRAO branch retinal artery occlusion; RT retinal tear; SB scleral buckle; PPV pars plana vitrectomy; VH Vitreous  hemorrhage; PRP panretinal laser photocoagulation; IVK intravitreal kenalog; VMT vitreomacular traction; MH Macular hole;  NVD neovascularization of the disc; NVE neovascularization elsewhere; AREDS age related eye disease study; ARMD age related macular degeneration; POAG primary open angle glaucoma; EBMD epithelial/anterior basement membrane dystrophy; ACIOL anterior chamber intraocular lens; IOL intraocular lens; PCIOL posterior chamber intraocular lens; Phaco/IOL phacoemulsification with intraocular lens placement; [place] photorefractive keratectomy; LASIK laser assisted in situ keratomileusis; HTN hypertension; DM diabetes mellitus; COPD chronic obstructive pulmonary disease

## 2020-05-01 ENCOUNTER — Encounter (INDEPENDENT_AMBULATORY_CARE_PROVIDER_SITE_OTHER): Payer: Medicare Other | Admitting: Ophthalmology

## 2020-05-01 DIAGNOSIS — Z992 Dependence on renal dialysis: Secondary | ICD-10-CM | POA: Diagnosis not present

## 2020-05-01 DIAGNOSIS — E1122 Type 2 diabetes mellitus with diabetic chronic kidney disease: Secondary | ICD-10-CM | POA: Diagnosis not present

## 2020-05-01 DIAGNOSIS — D631 Anemia in chronic kidney disease: Secondary | ICD-10-CM | POA: Diagnosis not present

## 2020-05-01 DIAGNOSIS — N186 End stage renal disease: Secondary | ICD-10-CM | POA: Diagnosis not present

## 2020-05-01 DIAGNOSIS — E876 Hypokalemia: Secondary | ICD-10-CM | POA: Diagnosis not present

## 2020-05-01 DIAGNOSIS — N2581 Secondary hyperparathyroidism of renal origin: Secondary | ICD-10-CM | POA: Diagnosis not present

## 2020-05-01 DIAGNOSIS — D509 Iron deficiency anemia, unspecified: Secondary | ICD-10-CM | POA: Diagnosis not present

## 2020-05-03 DIAGNOSIS — Z992 Dependence on renal dialysis: Secondary | ICD-10-CM | POA: Diagnosis not present

## 2020-05-03 DIAGNOSIS — E876 Hypokalemia: Secondary | ICD-10-CM | POA: Diagnosis not present

## 2020-05-03 DIAGNOSIS — D631 Anemia in chronic kidney disease: Secondary | ICD-10-CM | POA: Diagnosis not present

## 2020-05-03 DIAGNOSIS — N2581 Secondary hyperparathyroidism of renal origin: Secondary | ICD-10-CM | POA: Diagnosis not present

## 2020-05-03 DIAGNOSIS — D509 Iron deficiency anemia, unspecified: Secondary | ICD-10-CM | POA: Diagnosis not present

## 2020-05-03 DIAGNOSIS — N186 End stage renal disease: Secondary | ICD-10-CM | POA: Diagnosis not present

## 2020-05-06 DIAGNOSIS — N2581 Secondary hyperparathyroidism of renal origin: Secondary | ICD-10-CM | POA: Diagnosis not present

## 2020-05-06 DIAGNOSIS — Z992 Dependence on renal dialysis: Secondary | ICD-10-CM | POA: Diagnosis not present

## 2020-05-06 DIAGNOSIS — D509 Iron deficiency anemia, unspecified: Secondary | ICD-10-CM | POA: Diagnosis not present

## 2020-05-06 DIAGNOSIS — N186 End stage renal disease: Secondary | ICD-10-CM | POA: Diagnosis not present

## 2020-05-06 DIAGNOSIS — E876 Hypokalemia: Secondary | ICD-10-CM | POA: Diagnosis not present

## 2020-05-06 DIAGNOSIS — D631 Anemia in chronic kidney disease: Secondary | ICD-10-CM | POA: Diagnosis not present

## 2020-05-07 ENCOUNTER — Ambulatory Visit (INDEPENDENT_AMBULATORY_CARE_PROVIDER_SITE_OTHER): Payer: Medicare Other

## 2020-05-07 DIAGNOSIS — J309 Allergic rhinitis, unspecified: Secondary | ICD-10-CM | POA: Diagnosis not present

## 2020-05-08 DIAGNOSIS — N2581 Secondary hyperparathyroidism of renal origin: Secondary | ICD-10-CM | POA: Diagnosis not present

## 2020-05-08 DIAGNOSIS — D509 Iron deficiency anemia, unspecified: Secondary | ICD-10-CM | POA: Diagnosis not present

## 2020-05-08 DIAGNOSIS — E876 Hypokalemia: Secondary | ICD-10-CM | POA: Diagnosis not present

## 2020-05-08 DIAGNOSIS — Z992 Dependence on renal dialysis: Secondary | ICD-10-CM | POA: Diagnosis not present

## 2020-05-08 DIAGNOSIS — N186 End stage renal disease: Secondary | ICD-10-CM | POA: Diagnosis not present

## 2020-05-08 DIAGNOSIS — D631 Anemia in chronic kidney disease: Secondary | ICD-10-CM | POA: Diagnosis not present

## 2020-05-10 DIAGNOSIS — N2581 Secondary hyperparathyroidism of renal origin: Secondary | ICD-10-CM | POA: Diagnosis not present

## 2020-05-10 DIAGNOSIS — N186 End stage renal disease: Secondary | ICD-10-CM | POA: Diagnosis not present

## 2020-05-10 DIAGNOSIS — D509 Iron deficiency anemia, unspecified: Secondary | ICD-10-CM | POA: Diagnosis not present

## 2020-05-10 DIAGNOSIS — D631 Anemia in chronic kidney disease: Secondary | ICD-10-CM | POA: Diagnosis not present

## 2020-05-10 DIAGNOSIS — Z992 Dependence on renal dialysis: Secondary | ICD-10-CM | POA: Diagnosis not present

## 2020-05-10 DIAGNOSIS — E876 Hypokalemia: Secondary | ICD-10-CM | POA: Diagnosis not present

## 2020-05-13 DIAGNOSIS — E876 Hypokalemia: Secondary | ICD-10-CM | POA: Diagnosis not present

## 2020-05-13 DIAGNOSIS — Z992 Dependence on renal dialysis: Secondary | ICD-10-CM | POA: Diagnosis not present

## 2020-05-13 DIAGNOSIS — D509 Iron deficiency anemia, unspecified: Secondary | ICD-10-CM | POA: Diagnosis not present

## 2020-05-13 DIAGNOSIS — D631 Anemia in chronic kidney disease: Secondary | ICD-10-CM | POA: Diagnosis not present

## 2020-05-13 DIAGNOSIS — N2581 Secondary hyperparathyroidism of renal origin: Secondary | ICD-10-CM | POA: Diagnosis not present

## 2020-05-13 DIAGNOSIS — N186 End stage renal disease: Secondary | ICD-10-CM | POA: Diagnosis not present

## 2020-05-14 ENCOUNTER — Ambulatory Visit (INDEPENDENT_AMBULATORY_CARE_PROVIDER_SITE_OTHER): Payer: Medicare Other

## 2020-05-14 DIAGNOSIS — J309 Allergic rhinitis, unspecified: Secondary | ICD-10-CM | POA: Diagnosis not present

## 2020-05-14 DIAGNOSIS — H0288A Meibomian gland dysfunction right eye, upper and lower eyelids: Secondary | ICD-10-CM | POA: Diagnosis not present

## 2020-05-14 DIAGNOSIS — H0288B Meibomian gland dysfunction left eye, upper and lower eyelids: Secondary | ICD-10-CM | POA: Diagnosis not present

## 2020-05-14 DIAGNOSIS — H25813 Combined forms of age-related cataract, bilateral: Secondary | ICD-10-CM | POA: Diagnosis not present

## 2020-05-14 DIAGNOSIS — E113593 Type 2 diabetes mellitus with proliferative diabetic retinopathy without macular edema, bilateral: Secondary | ICD-10-CM | POA: Diagnosis not present

## 2020-05-15 DIAGNOSIS — Z992 Dependence on renal dialysis: Secondary | ICD-10-CM | POA: Diagnosis not present

## 2020-05-15 DIAGNOSIS — D509 Iron deficiency anemia, unspecified: Secondary | ICD-10-CM | POA: Diagnosis not present

## 2020-05-15 DIAGNOSIS — N2581 Secondary hyperparathyroidism of renal origin: Secondary | ICD-10-CM | POA: Diagnosis not present

## 2020-05-15 DIAGNOSIS — E876 Hypokalemia: Secondary | ICD-10-CM | POA: Diagnosis not present

## 2020-05-15 DIAGNOSIS — D631 Anemia in chronic kidney disease: Secondary | ICD-10-CM | POA: Diagnosis not present

## 2020-05-15 DIAGNOSIS — N186 End stage renal disease: Secondary | ICD-10-CM | POA: Diagnosis not present

## 2020-05-17 DIAGNOSIS — N2581 Secondary hyperparathyroidism of renal origin: Secondary | ICD-10-CM | POA: Diagnosis not present

## 2020-05-17 DIAGNOSIS — D631 Anemia in chronic kidney disease: Secondary | ICD-10-CM | POA: Diagnosis not present

## 2020-05-17 DIAGNOSIS — N186 End stage renal disease: Secondary | ICD-10-CM | POA: Diagnosis not present

## 2020-05-17 DIAGNOSIS — D509 Iron deficiency anemia, unspecified: Secondary | ICD-10-CM | POA: Diagnosis not present

## 2020-05-17 DIAGNOSIS — Z992 Dependence on renal dialysis: Secondary | ICD-10-CM | POA: Diagnosis not present

## 2020-05-17 DIAGNOSIS — E876 Hypokalemia: Secondary | ICD-10-CM | POA: Diagnosis not present

## 2020-05-20 ENCOUNTER — Encounter (INDEPENDENT_AMBULATORY_CARE_PROVIDER_SITE_OTHER): Payer: Medicare Other | Admitting: Ophthalmology

## 2020-05-20 DIAGNOSIS — D631 Anemia in chronic kidney disease: Secondary | ICD-10-CM | POA: Diagnosis not present

## 2020-05-20 DIAGNOSIS — N186 End stage renal disease: Secondary | ICD-10-CM | POA: Diagnosis not present

## 2020-05-20 DIAGNOSIS — D509 Iron deficiency anemia, unspecified: Secondary | ICD-10-CM | POA: Diagnosis not present

## 2020-05-20 DIAGNOSIS — Z992 Dependence on renal dialysis: Secondary | ICD-10-CM | POA: Diagnosis not present

## 2020-05-20 DIAGNOSIS — N2581 Secondary hyperparathyroidism of renal origin: Secondary | ICD-10-CM | POA: Diagnosis not present

## 2020-05-20 DIAGNOSIS — E876 Hypokalemia: Secondary | ICD-10-CM | POA: Diagnosis not present

## 2020-05-21 ENCOUNTER — Ambulatory Visit (INDEPENDENT_AMBULATORY_CARE_PROVIDER_SITE_OTHER): Payer: Medicare Other | Admitting: Ophthalmology

## 2020-05-21 ENCOUNTER — Encounter (INDEPENDENT_AMBULATORY_CARE_PROVIDER_SITE_OTHER): Payer: Self-pay | Admitting: Ophthalmology

## 2020-05-21 ENCOUNTER — Other Ambulatory Visit: Payer: Self-pay

## 2020-05-21 DIAGNOSIS — Z794 Long term (current) use of insulin: Secondary | ICD-10-CM | POA: Diagnosis not present

## 2020-05-21 DIAGNOSIS — E1165 Type 2 diabetes mellitus with hyperglycemia: Secondary | ICD-10-CM

## 2020-05-21 DIAGNOSIS — E113521 Type 2 diabetes mellitus with proliferative diabetic retinopathy with traction retinal detachment involving the macula, right eye: Secondary | ICD-10-CM | POA: Diagnosis not present

## 2020-05-21 DIAGNOSIS — E113512 Type 2 diabetes mellitus with proliferative diabetic retinopathy with macular edema, left eye: Secondary | ICD-10-CM

## 2020-05-21 DIAGNOSIS — I779 Disorder of arteries and arterioles, unspecified: Secondary | ICD-10-CM

## 2020-05-21 DIAGNOSIS — IMO0002 Reserved for concepts with insufficient information to code with codable children: Secondary | ICD-10-CM

## 2020-05-21 NOTE — Progress Notes (Signed)
05/21/2020     CHIEF COMPLAINT Patient presents for Retina Follow Up   HISTORY OF PRESENT ILLNESS: Destiny Day is a 69 y.o. female who presents to the clinic today for:   HPI    Retina Follow Up    Patient presents with  Retinal Break/Detachment (MAC Edema).  In right eye.  Severity is severe.  Duration of 3 weeks.  Since onset it is stable.  I, the attending physician,  performed the HPI with the patient and updated documentation appropriately.          Comments    3 week f\u OS. B Scan. Possible Pre Op  Pt states vision is the same since last visit       Last edited by Tilda Franco on 05/21/2020  3:26 PM. (History)      Referring physician: Cipriano Mile, NP [place],  [place] [address]  HISTORICAL INFORMATION:   Selected notes from the MEDICAL RECORD NUMBER    Lab Results  Component Value Date   HGBA1C 5.6 06/26/2019     CURRENT MEDICATIONS: No current outpatient medications on file. (Ophthalmic Drugs)   No current facility-administered medications for this visit. (Ophthalmic Drugs)   Current Outpatient Medications (Other)  Medication Sig  . albuterol (PROAIR HFA) 108 (90 Base) MCG/ACT inhaler 4 puffs every 4-6 hours as needed  . amLODipine (NORVASC) 10 MG tablet Take 10 mg by mouth at bedtime.  Marland Kitchen aspirin EC 81 MG tablet Take 81 mg by mouth daily.  Marland Kitchen atorvastatin (LIPITOR) 40 MG tablet Take 1 tablet (40 mg total) by mouth daily.  Marland Kitchen azelastine (ASTELIN) 0.1 % nasal spray Place 1 spray into both nostrils 2 (two) times daily.  . benzonatate (TESSALON PERLES) 100 MG capsule Take 1 capsule (100 mg total) by mouth 3 (three) times daily as needed for cough.  Marland Kitchen BIDIL 20-37.5 MG tablet TAKE ONE TABLET BY MOUTH THREE TIMES A DAY   . budesonide-formoterol (SYMBICORT) 160-4.5 MCG/ACT inhaler 2 puffs twice daily with spacer  . busPIRone (BUSPAR) 7.5 MG tablet 7.5 mg.   . carvedilol (COREG) 12.5 MG tablet Take 12.5 mg by mouth See admin  instructions. Take 1 tablet (12.5 mg) by mouth once daily in the evening on Mondays, Wednesdays, & Fridays. (Dialysis) Take 1 tablet (12.5 mg) by mouth twice daily on Sundays, Tuesdays, Thursdays, & Saturdays. (Non Dialysis)  . cinacalcet (SENSIPAR) 60 MG tablet Take 90 mg by mouth See admin instructions. Take 1 tablet (90 mg) by mouth in the evening on Mondays, Wednesdays, & Fridays (dialysis) Take 1 tablet (90 mg) by mouth twice daily on Sundays, Tuesdays, Thursdays, Saturdays. (non dialysis)  . diazepam (VALIUM) 5 MG tablet Take 5 mg by mouth 2 (two) times daily as needed for anxiety.  . docusate sodium (COLACE) 100 MG capsule Take 1 capsule (100 mg total) by mouth 2 (two) times daily.  Marland Kitchen EPINEPHrine 0.3 mg/0.3 mL IJ SOAJ injection Inject 0.3 mLs (0.3 mg total) into the muscle as needed for anaphylaxis.  . ferric citrate (AURYXIA) 1 GM 210 MG(Fe) tablet Take 210-420 mg by mouth See admin instructions. Take 2 tablets (420 mg) by mouth with each meal & take 1 tablet (210 mg) by mouth with snacks.  . fluticasone (FLONASE) 50 MCG/ACT nasal spray Place 2 sprays into both nostrils daily as needed for allergies or rhinitis.  Marland Kitchen gabapentin (NEURONTIN) 100 MG capsule TAKE 1 CAPSULE (100 MG TOTAL) BY MOUTH AT BEDTIME. WHEN NECESSARY FOR NEUROPATHY PAIN  .  glipiZIDE (GLUCOTROL) 5 MG tablet Take 5 mg by mouth daily before breakfast.  . guaiFENesin-codeine (ROBITUSSIN AC) 100-10 MG/5ML syrup Take by mouth.  . lidocaine-prilocaine (EMLA) cream Apply 1 application topically 3 (three) times a week. 1-2 hours prior to dialysis  . LOPERAMIDE HCL PO Take by mouth.  . montelukast (SINGULAIR) 10 MG tablet Take 1 tablet (10 mg total) by mouth at bedtime.  . multivitamin (RENA-VIT) TABS tablet Take 1 tablet by mouth daily.   Marland Kitchen omeprazole (PRILOSEC) 20 MG capsule Take 20 mg by mouth daily.   . ondansetron (ZOFRAN) 4 MG tablet Take 1 tablet (4 mg total) by mouth every 6 (six) hours as needed for nausea.  . polyethylene  glycol (MIRALAX / GLYCOLAX) 17 g packet Take 17 g by mouth daily.  . traZODone (DESYREL) 50 MG tablet   . Vitamin D, Ergocalciferol, (DRISDOL) 1.25 MG (50000 UT) CAPS capsule Take 50,000 Units by mouth every Sunday. Takes on Sunday   . XYZAL ALLERGY 24HR 5 MG tablet Take 5 mg by mouth daily.   No current facility-administered medications for this visit. (Other)      REVIEW OF SYSTEMS:    ALLERGIES Allergies  Allergen Reactions  . Adhesive  [Tape] Hives  . Lisinopril Cough  . Prednisone Swelling and Other (See Comments)    Excessive fluid buildup    PAST MEDICAL HISTORY Past Medical History:  Diagnosis Date  . Abdominal bruit   . Anemia   . Anxiety   . Arthritis    Osteoarthritis  . Asthma   . Cervical disc disease    "pinced nerve"  . CHF (congestive heart failure) ([place])   . Complication of anesthesia    " after I got home from my last procedure, I started itching."  . Diabetes mellitus    Type II  . Diverticulitis   . ESRD (end stage renal disease) ([place])    dialysis - M/W/F- [place]  . GERD (gastroesophageal reflux disease)    from medications  . GI bleed 03/31/2013  . Head injury 07/2017  . History of hiatal hernia   . Hyperlipidemia   . Hypertension   . Neuropathy    left leg  . Osteoporosis   . Peripheral vascular disease ([place])   . Pneumonia    "very young" and a few years ago  . PONV (postoperative nausea and vomiting)   . Seasonal allergies   . Shortness of breath dyspnea    WIth exertion, when fluid builds  . Sleep apnea    can't afford cpap    Past Surgical History:  Procedure Laterality Date  . A/V SHUNTOGRAM N/A 09/22/2016   Procedure: A/V Shuntogram - left arm;  Surgeon: Serafina Mitchell, MD;  Location: [facility];  Service: Cardiovascular;  Laterality: N/A;  . A/V SHUNTOGRAM N/A 03/22/2018   Procedure: A/V SHUNTOGRAM - left arm;  Surgeon: Serafina Mitchell, MD;  Location: [facility];  Service: Cardiovascular;  Laterality: N/A;    . A/V SHUNTOGRAM Left 11/17/2018   Procedure: A/V SHUNTOGRAM;  Surgeon: Angelia Mould, MD;  Location: [facility];  Service: Cardiovascular;  Laterality: Left;  . ABDOMINAL HYSTERECTOMY  1993`  . AMPUTATION Left 09/01/2016   Procedure: LEFT FOOT TRANSMETATARSAL AMPUTATION;  Surgeon: Newt Minion, MD;  Location: [facility];  Service: Orthopedics;  Laterality: Left;  . AMPUTATION Right 08/11/2017   Procedure: RIGHT GREAT TOE AMPUTATION DIGIT;  Surgeon: Rosetta Posner, MD;  Location: [facility];  Service: Vascular;  Laterality: Right;  . AMPUTATION Right 12/31/2017   Procedure: RIGHT TRANSMETATARSAL AMPUTATION;  Surgeon: Newt Minion, MD;  Location: [facility];  Service: Orthopedics;  Laterality: Right;  . AV FISTULA PLACEMENT Left 04/21/2016   Procedure: INSERTION OF ARTERIOVENOUS (AV) GORE-TEX GRAFT ARM LEFT;  Surgeon: Elam Dutch, MD;  Location: [facility];  Service: Vascular;  Laterality: Left;  . [place] TRANSPOSITION Left 07/10/2014   Procedure: BASCILIC VEIN TRANSPOSITION;  Surgeon: Angelia Mould, MD;  Location: [facility];  Service: Vascular;  Laterality: Left;  . BASCILIC VEIN TRANSPOSITION Right 11/08/2014   Procedure: FIRST STAGE BASILIC VEIN TRANSPOSITION;  Surgeon: Angelia Mould, MD;  Location: [facility];  Service: Vascular;  Laterality: Right;  . BASCILIC VEIN TRANSPOSITION Right 01/18/2015   Procedure: SECOND STAGE BASILIC VEIN TRANSPOSITION;  Surgeon: Angelia Mould, MD;  Location: [facility];  Service: Vascular;  Laterality: Right;  . CRANIOTOMY N/A 08/23/2017   Procedure: CRANIOTOMY HEMATOMA EVACUATION SUBDURAL;  Surgeon: Ashok Pall, MD;  Location: [facility];  Service: Neurosurgery;  Laterality: N/A;  . CRANIOTOMY Left 08/24/2017   Procedure: CRANIOTOMY FOR RECURRENT ACUTE SUBDURAL HEMATOMA;  Surgeon: Ashok Pall, MD;  Location: [facility];  Service: Neurosurgery;  Laterality: Left;  . ESOPHAGOGASTRODUODENOSCOPY N/A 03/31/2013   Procedure: ESOPHAGOGASTRODUODENOSCOPY (EGD);   Surgeon: Gatha Mayer, MD;  Location: [facility]OSCOPY;  Service: Endoscopy;  Laterality: N/A;  . EYE SURGERY     laser surgery  . FEMORAL-POPLITEAL BYPASS GRAFT Left 07/08/2016   Procedure: LEFT  FEMORAL-BELOW KNEE POPLITEAL ARTERY BYPASS GRAFT USING 6MM X 80 CM PROPATEN GORETEX GRAFT WITH RINGS.;  Surgeon: Rosetta Posner, MD;  Location: [facility];  Service: Vascular;  Laterality: Left;  . FEMORAL-POPLITEAL BYPASS GRAFT Right 08/11/2017   Procedure: RIGHT FEMORAL TO BELOW KNEE POPLITEAL ARTERKY  BYPASS GRAFT USING 6MM RINGED PROPATEN GRAFT;  Surgeon: Rosetta Posner, MD;  Location: [facility];  Service: Vascular;  Laterality: Right;  . FISTULOGRAM Left 10/29/2014   Procedure: FISTULOGRAM;  Surgeon: Angelia Mould, MD;  Location[facility];  Service: Cardiovascular;  Laterality: Left;  Marland Kitchen GAS/FLUID EXCHANGE Left 12/20/2018   Procedure: AIR GAS EXCHANGE;  Surgeon: Jalene Mullet, MD;  Location: [place] [facility];  Service: Ophthalmology;  Laterality: Left;  . KNEE ARTHROSCOPY Right 05/06/2018   Procedure: RIGHT KNEE ARTHROSCOPY;  Surgeon: Newt Minion, MD;  Location: [facility];  Service: Orthopedics;  Laterality: Right;  . KNEE ARTHROSCOPY Right 06/10/2018   Procedure: RIGHT KNEE ARTHROSCOPY AND DEBRIDEMENT;  Surgeon: Newt Minion, MD;  Location: [facility];  Service: Orthopedics;  Laterality: Right;  . LIGATION OF ARTERIOVENOUS  FISTULA Left 04/21/2016   Procedure: LIGATION OF ARTERIOVENOUS  FISTULA LEFT ARM;  Surgeon: Elam Dutch, MD;  Location: [place];  Service: Vascular;  Laterality: Left;  . LOWER EXTREMITY ANGIOGRAPHY N/A 04/06/2017   Procedure: Lower Extremity Angiography - Right;  Surgeon: Serafina Mitchell, MD;  Location: [facility];  Service: Cardiovascular;  Laterality: N/A;  . MEMBRANE PEEL Left 12/20/2018   Procedure: MEMBRANE PEEL;  Surgeon: Jalene Mullet, MD;  Location: [facility];  Service: Ophthalmology;  Laterality: Left;  . PATCH ANGIOPLASTY Right 01/18/2015   Procedure: BASILIC VEIN PATCH  ANGIOPLASTY USING VASCUGUARD PATCH;  Surgeon: Angelia Mould, MD;  Location: [facility];  Service: Vascular;  Laterality: Right;  . PERIPHERAL VASCULAR BALLOON ANGIOPLASTY  09/22/2016   Procedure: Peripheral Vascular Balloon Angioplasty;  Surgeon: Serafina Mitchell, MD;  Location: [facility];  Service: Cardiovascular;;  Lt. Fistula  . PERIPHERAL VASCULAR BALLOON ANGIOPLASTY Left 03/22/2018   Procedure: PERIPHERAL VASCULAR BALLOON ANGIOPLASTY;  Surgeon: Serafina Mitchell, MD;  Location: [facility];  Service: Cardiovascular;  Laterality: Left;  Arm shunt  . PERIPHERAL VASCULAR CATHETERIZATION N/A 06/23/2016   Procedure: Abdominal Aortogram w/Lower Extremity;  Surgeon: Serafina Mitchell, MD;  Location: [facility];  Service: Cardiovascular;  Laterality: N/A;  . PERIPHERAL VASCULAR CATHETERIZATION  06/23/2016   Procedure: Peripheral Vascular Intervention;  Surgeon: Serafina Mitchell, MD;  Location: [facility];  Service: Cardiovascular;;  lt common and external illiac artery  . PHOTOCOAGULATION WITH LASER Left 12/20/2018   Procedure: PHOTOCOAGULATION WITH LASER;  Surgeon: Jalene Mullet, MD;  Location: [facility];  Service: Ophthalmology;  Laterality: Left;  . 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 Other Mother        not sure of cause of death  . Diabetes Father   . Pancreatic cancer Maternal Grandmother   . Diabetes Sister   . Diabetes Sister   . Colon cancer Neg Hx     SOCIAL HISTORY Social History   Tobacco Use  . Smoking status: Former Smoker    Packs/day: 0.35    Years: 40.00    Pack years: 14.00    Types: Cigarettes    Quit date: 05/10/2012    Years since quitting: 8.0  . Smokeless tobacco: Never Used  Vaping Use  .  Vaping Use: Never used  Substance Use Topics  . Alcohol use: No  . Drug use: No    Comment: marijuana; quit in early 1980's         OPHTHALMIC EXAM:  Base Eye Exam    Visual Acuity (Snellen - Linear)      Right Left   Dist [place] 20/60 -2 CF @ 4'   Dist ph [place] NI NI       Tonometry (Tonopen, 3:30 PM)      Right Left   Pressure 18 20       Pupils      Pupils Dark Light Shape React APD   Right PERRL 3 3 Round Minimal None   Left PERRL 3 3 Round Minimal None       Neuro/Psych    Oriented x3: Yes   Mood/Affect: Normal       Dilation    Left eye: 1.0% Mydriacyl, 2.5% Phenylephrine @ 3:30 PM        Slit Lamp and Fundus Exam    External Exam      Right Left   External Normal Normal       Slit Lamp Exam      Right Left   Lids/Lashes Normal Normal   Conjunctiva/Sclera White and quiet White and quiet   Cornea Clear Clear   Anterior Chamber Deep and quiet Deep and quiet   Iris Round and reactive Round and reactive   Lens Nuclear sclerosis 2+ Nuclear sclerosis 3+   Anterior Vitreous Normal Normal       Fundus Exam      Right Left   Posterior Vitreous Normal Vitrectomized, by history   Disc  No details through cataract   C/D Ratio  0.3   Macula  Poor details through  the dense cataract   Vessels  Good PRP, glimpses   Periphery  There is a attached peripherally          IMAGING AND PROCEDURES  Imaging and Procedures for 05/21/20           ASSESSMENT/PLAN:  No problem-specific Assessment & Plan notes found for this encounter.      ICD-10-CM   1. 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 Uncontrolled diabetes mellitus with right eye affected by proliferative retinopathy and traction retinal detachment involving macula, with long-term current use of insulin ([place])  U38.4536    E11.65    Z79.4     1.  Patient scheduled to have cataract surgery with Dr. Carolynn Sayers,  November 19 left eye.  I have asked the patient to return to see [place] in 5 to 6 weeks to look for signs of progression of retinopathy  2.  3.  Ophthalmic Meds Ordered this visit:  No orders of the defined types were placed in this encounter.      Return in about 6 weeks (around 07/02/2020) for DILATE OU, COLOR FP, OCT.  There are no Patient Instructions on file for this visit.   Explained the diagnoses, plan, and follow up with the patient and they expressed understanding.  Patient expressed understanding of the importance of proper follow up care.   Clent Demark Chino Sardo M.D. Diseases & Surgery of the Retina and Vitreous Retina & Diabetic [place] 05/21/20     Abbreviations: M myopia (nearsighted); A astigmatism; H hyperopia (farsighted); P presbyopia; Mrx spectacle prescription;  CTL contact lenses; OD right eye; OS left eye; OU both eyes  XT exotropia; ET esotropia; PEK punctate epithelial keratitis; PEE punctate epithelial erosions; DES dry eye syndrome; MGD meibomian gland dysfunction; ATs artificial tears; PFAT's preservative free artificial tears; [place] nuclear sclerotic cataract; PSC posterior subcapsular cataract; ERM epi-retinal membrane; PVD posterior vitreous detachment; RD retinal detachment; DM diabetes mellitus; DR diabetic retinopathy; NPDR non-proliferative diabetic retinopathy; PDR proliferative diabetic retinopathy; CSME clinically significant macular edema; DME diabetic macular edema; dbh dot blot hemorrhages; CWS cotton wool spot; POAG primary open angle glaucoma; C/D cup-to-disc ratio; HVF humphrey visual field; GVF goldmann visual field; OCT optical coherence tomography; IOP intraocular pressure; BRVO Branch retinal vein occlusion; CRVO central retinal vein occlusion; CRAO central retinal artery occlusion; BRAO branch retinal artery occlusion; RT retinal tear; SB scleral buckle; PPV pars plana vitrectomy; VH Vitreous hemorrhage; PRP panretinal laser photocoagulation; IVK  intravitreal kenalog; VMT vitreomacular traction; MH Macular hole;  NVD neovascularization of the disc; NVE neovascularization elsewhere; AREDS age related eye disease study; ARMD age related macular degeneration; POAG primary open angle glaucoma; EBMD epithelial/anterior basement membrane dystrophy; ACIOL anterior chamber intraocular lens; IOL intraocular lens; PCIOL posterior chamber intraocular lens; Phaco/IOL phacoemulsification with intraocular lens placement; [place] photorefractive keratectomy; LASIK laser assisted in situ keratomileusis; HTN hypertension; DM diabetes mellitus; COPD chronic obstructive pulmonary disease

## 2020-05-22 DIAGNOSIS — Z992 Dependence on renal dialysis: Secondary | ICD-10-CM | POA: Diagnosis not present

## 2020-05-22 DIAGNOSIS — D631 Anemia in chronic kidney disease: Secondary | ICD-10-CM | POA: Diagnosis not present

## 2020-05-22 DIAGNOSIS — N186 End stage renal disease: Secondary | ICD-10-CM | POA: Diagnosis not present

## 2020-05-22 DIAGNOSIS — D509 Iron deficiency anemia, unspecified: Secondary | ICD-10-CM | POA: Diagnosis not present

## 2020-05-22 DIAGNOSIS — E876 Hypokalemia: Secondary | ICD-10-CM | POA: Diagnosis not present

## 2020-05-22 DIAGNOSIS — N2581 Secondary hyperparathyroidism of renal origin: Secondary | ICD-10-CM | POA: Diagnosis not present

## 2020-05-24 DIAGNOSIS — N186 End stage renal disease: Secondary | ICD-10-CM | POA: Diagnosis not present

## 2020-05-24 DIAGNOSIS — D631 Anemia in chronic kidney disease: Secondary | ICD-10-CM | POA: Diagnosis not present

## 2020-05-24 DIAGNOSIS — N2581 Secondary hyperparathyroidism of renal origin: Secondary | ICD-10-CM | POA: Diagnosis not present

## 2020-05-24 DIAGNOSIS — D509 Iron deficiency anemia, unspecified: Secondary | ICD-10-CM | POA: Diagnosis not present

## 2020-05-24 DIAGNOSIS — E876 Hypokalemia: Secondary | ICD-10-CM | POA: Diagnosis not present

## 2020-05-24 DIAGNOSIS — Z992 Dependence on renal dialysis: Secondary | ICD-10-CM | POA: Diagnosis not present

## 2020-05-27 DIAGNOSIS — E8779 Other fluid overload: Secondary | ICD-10-CM | POA: Diagnosis not present

## 2020-05-27 DIAGNOSIS — D631 Anemia in chronic kidney disease: Secondary | ICD-10-CM | POA: Diagnosis not present

## 2020-05-27 DIAGNOSIS — N2581 Secondary hyperparathyroidism of renal origin: Secondary | ICD-10-CM | POA: Diagnosis not present

## 2020-05-27 DIAGNOSIS — D509 Iron deficiency anemia, unspecified: Secondary | ICD-10-CM | POA: Diagnosis not present

## 2020-05-27 DIAGNOSIS — Z992 Dependence on renal dialysis: Secondary | ICD-10-CM | POA: Diagnosis not present

## 2020-05-27 DIAGNOSIS — E876 Hypokalemia: Secondary | ICD-10-CM | POA: Diagnosis not present

## 2020-05-27 DIAGNOSIS — N186 End stage renal disease: Secondary | ICD-10-CM | POA: Diagnosis not present

## 2020-05-27 DIAGNOSIS — I159 Secondary hypertension, unspecified: Secondary | ICD-10-CM | POA: Diagnosis not present

## 2020-05-28 ENCOUNTER — Ambulatory Visit (INDEPENDENT_AMBULATORY_CARE_PROVIDER_SITE_OTHER): Payer: Medicare Other

## 2020-05-28 ENCOUNTER — Ambulatory Visit: Payer: Self-pay | Admitting: *Deleted

## 2020-05-28 DIAGNOSIS — J309 Allergic rhinitis, unspecified: Secondary | ICD-10-CM

## 2020-05-29 DIAGNOSIS — D631 Anemia in chronic kidney disease: Secondary | ICD-10-CM | POA: Diagnosis not present

## 2020-05-29 DIAGNOSIS — Z992 Dependence on renal dialysis: Secondary | ICD-10-CM | POA: Diagnosis not present

## 2020-05-29 DIAGNOSIS — N2581 Secondary hyperparathyroidism of renal origin: Secondary | ICD-10-CM | POA: Diagnosis not present

## 2020-05-29 DIAGNOSIS — N186 End stage renal disease: Secondary | ICD-10-CM | POA: Diagnosis not present

## 2020-05-29 DIAGNOSIS — E876 Hypokalemia: Secondary | ICD-10-CM | POA: Diagnosis not present

## 2020-05-29 DIAGNOSIS — D509 Iron deficiency anemia, unspecified: Secondary | ICD-10-CM | POA: Diagnosis not present

## 2020-05-30 ENCOUNTER — Encounter: Payer: Self-pay | Admitting: *Deleted

## 2020-05-30 ENCOUNTER — Other Ambulatory Visit: Payer: Self-pay | Admitting: *Deleted

## 2020-05-30 NOTE — Patient Outreach (Signed)
Referral from Jill Wine, RN to [facility] for Medication Assistance. 

## 2020-05-30 NOTE — Patient Outreach (Signed)
[facility]) Care Management  [place]  05/30/2020   LAPORSHA GREALISH 1951/03/18 [id number]  Subjective: Successful telephone outreach call to patient. HIPAA identifiers obtained. Patient states she is doing well. Patient reports that she has not had any recent falls and that she is supported by her husband [name] and family. She is unable to afford her prescribed Symbicort and epi pen. Nurse will send a referral to pharmacy for medication assistance.   Patient states that she does not have a B/P cuff to check her B/P at home. Currently she goes to dialysis 3 times weekly where her weight and B/P are monitored, recorded, and given to her in a printout. Patient states she does have a scale at home but does not weigh herself because of balance issues due to bilateral partial foot amputations. Nurse will send her a B/P cuff in the mail. Patient reports that she does try her best to follow the very restrictive diet that is required for her HTN, CHF, diabetes, and renal failure; she is on 35 cc fluid restriction daily. She wants to increase her physical activity to maintain her strength and independence. Patient does ambulate using a walker and is able to perform her ADLs and IADLs. Nurse will send patient chair exercise printouts.   Encounter Medications:  Outpatient Encounter Medications as of 05/30/2020  Medication Sig Note  . albuterol (PROAIR HFA) 108 (90 Base) MCG/ACT inhaler 4 puffs every 4-6 hours as needed   . amLODipine (NORVASC) 10 MG tablet Take 10 mg by mouth at bedtime.   Marland Kitchen aspirin EC 81 MG tablet Take 81 mg by mouth daily.   Marland Kitchen atorvastatin (LIPITOR) 40 MG tablet Take 1 tablet (40 mg total) by mouth daily.   Marland Kitchen azelastine (ASTELIN) 0.1 % nasal spray Place 1 spray into both nostrils 2 (two) times daily.   . benzonatate (TESSALON PERLES) 100 MG capsule Take 1 capsule (100 mg total) by mouth 3 (three) times daily as needed for cough.   Marland Kitchen BIDIL 20-37.5 MG tablet  TAKE ONE TABLET BY MOUTH THREE TIMES A DAY    . busPIRone (BUSPAR) 7.5 MG tablet 7.5 mg.    . carvedilol (COREG) 12.5 MG tablet Take 12.5 mg by mouth See admin instructions. Take 1 tablet (12.5 mg) by mouth once daily in the evening on Mondays, Wednesdays, & Fridays. (Dialysis) Take 1 tablet (12.5 mg) by mouth twice daily on Sundays, Tuesdays, Thursdays, & Saturdays. (Non Dialysis)   . cinacalcet (SENSIPAR) 60 MG tablet Take 90 mg by mouth See admin instructions. Take 1 tablet (90 mg) by mouth in the evening on Mondays, Wednesdays, & Fridays (dialysis) Take 1 tablet (90 mg) by mouth twice daily on Sundays, Tuesdays, Thursdays, Saturdays. (non dialysis)   . diazepam (VALIUM) 5 MG tablet Take 5 mg by mouth 2 (two) times daily as needed for anxiety.   . docusate sodium (COLACE) 100 MG capsule Take 1 capsule (100 mg total) by mouth 2 (two) times daily.   . ferric citrate (AURYXIA) 1 GM 210 MG(Fe) tablet Take 210-420 mg by mouth See admin instructions. Take 2 tablets (420 mg) by mouth with each meal & take 1 tablet (210 mg) by mouth with snacks.   . fluticasone (FLONASE) 50 MCG/ACT nasal spray Place 2 sprays into both nostrils daily as needed for allergies or rhinitis.   Marland Kitchen gabapentin (NEURONTIN) 100 MG capsule TAKE 1 CAPSULE (100 MG TOTAL) BY MOUTH AT BEDTIME. WHEN NECESSARY FOR NEUROPATHY PAIN   .  glipiZIDE (GLUCOTROL) 5 MG tablet Take 5 mg by mouth daily before breakfast.   . guaiFENesin-codeine (ROBITUSSIN AC) 100-10 MG/5ML syrup Take by mouth.   . lidocaine-prilocaine (EMLA) cream Apply 1 application topically 3 (three) times a week. 1-2 hours prior to dialysis   . LOPERAMIDE HCL PO Take by mouth.   . montelukast (SINGULAIR) 10 MG tablet Take 1 tablet (10 mg total) by mouth at bedtime.   . multivitamin (RENA-VIT) TABS tablet Take 1 tablet by mouth daily.    Marland Kitchen omeprazole (PRILOSEC) 20 MG capsule Take 20 mg by mouth daily.    . ondansetron (ZOFRAN) 4 MG tablet Take 1 tablet (4 mg total) by mouth every  6 (six) hours as needed for nausea.   . polyethylene glycol (MIRALAX / GLYCOLAX) 17 g packet Take 17 g by mouth daily.   . traZODone (DESYREL) 50 MG tablet    . Vitamin D, Ergocalciferol, (DRISDOL) 1.25 MG (50000 UT) CAPS capsule Take 50,000 Units by mouth every Sunday. Takes on Sunday    . XYZAL ALLERGY 24HR 5 MG tablet Take 5 mg by mouth daily.   . budesonide-formoterol (SYMBICORT) 160-4.5 MCG/ACT inhaler 2 puffs twice daily with spacer (Patient not taking: Reported on 05/30/2020) 05/30/2020: Can't afford  . EPINEPHrine 0.3 mg/0.3 mL IJ SOAJ injection Inject 0.3 mLs (0.3 mg total) into the muscle as needed for anaphylaxis. (Patient not taking: Reported on 05/30/2020) 05/30/2020: Can't afford   No facility-administered encounter medications on file as of 05/30/2020.    Functional Status:  In your present state of health, do you have any difficulty performing the following activities: 07/18/2019  Hearing? N  Vision? N  Difficulty concentrating or making decisions? N  Walking or climbing stairs? Y  Dressing or bathing? N  Doing errands, shopping? N  Preparing Food and eating ? N  Using the Toilet? N  In the past six months, have you accidently leaked urine? N  Do you have problems with loss of bowel control? N  Managing your Medications? N  Managing your Finances? N  Housekeeping or managing your Housekeeping? N  Some recent data might be hidden    Fall/Depression Screening: Fall Risk  05/30/2020 05/30/2020 03/28/2020  Falls in the past year? 0 0 0  Number falls in past yr: 0 0 0  Injury with Fall? 0 0 1  Risk for fall due to : Impaired mobility;Impaired balance/gait Impaired mobility;Impaired balance/gait;Impaired vision -  Follow up Falls prevention discussed;Education provided;Falls evaluation completed Falls prevention discussed;Education provided;Falls evaluation completed -   PHQ 2/9 Scores 05/30/2020 03/28/2020 11/15/2019 08/15/2019 07/18/2019 06/20/2019 06/06/2019  PHQ - 2 Score 0 0 0  0 0 0 0    Assessment:  Goals Addressed              This Visit's Progress     Patient Stated   .  Patient will be able to manage/maintain her care of her HTN, CKD and CHF at home (pt-stated)        [place] (see longitudinal plan of care for additional care plan information)  Objective:  . Last practice recorded BP readings:  BP Readings from Last 3 Encounters:  02/06/20 (!) 157/74  02/04/20 (!) 169/49  01/09/20 124/82 .   Marland Kitchen Most recent eGFR/CrCl: No results found for: EGFR  No components found for: CRCL  Current Barriers:  Marland Kitchen Knowledge deficit related to self care management of hypertension . Knowledge deficit related to dangers of uncontrolled hypertension  Case Manager Clinical  Goal(s):  Marland Kitchen Over the next 90 days, patient will verbalize basic understanding of hypertension disease process and self health management plan as evidenced by reported bp values within normal limits during HD/at home  . over the next 90 days patient will verbalize maintanence of CKD, CHF and HTN during follow up contact  Interventions:  . Evaluation of current treatment plan related to hypertension self management and patient's adherence to plan as established by provider. . Reviewed medications with patient and discussed importance of compliance . Discussed plans with patient for ongoing care management follow up and provided patient with direct contact information for care management team . Provided education regarding s/s hypertensive crisis . Discussed signs and symptoms of hypertension . Will send patient a B/P for home monitoring  Patient Self Care Activities:  . Self administers medications as prescribed . Attends all scheduled provider appointments . Calls provider office for new concerns, questions, or BP outside discussed parameters . Monitors BP and records as discussed . Adheres to a low sodium diet/DASH diet . Increase physical activity as tolerated . Verbalize where to go  to receive medical care . Maintain a good phosphorus level per HD records . Call MD for new concerns with increased symptoms per CHF action plan (increased coughing and wt gain not related to allergies)  Please see past updates related to this goal by clicking on the "Past Updates" button in the selected goal   Updated 05/30/20       Other   .  Track and Manage Activity and Exertion        Follow Up Date 07/26/20    - follow activity or exercise plan - make an activity or exercise plan    Why is this important?   Exercising is very important when managing your heart failure.  It will help your heart get stronger.    Notes: Patient wants to stay as active and independent as possible. She wants to  maintain her strength and mobility. Nurse will send patient chair exercise printouts.    .  Track and Manage Fluids and Swelling        Follow Up Date 07/26/20    - call office if I gain more than 2 pounds in one day or 5 pounds in one week - keep legs up while sitting - track weight in diary - use salt in moderation - watch for swelling in feet, ankles and legs every day    Why is this important?   It is important to check your weight daily and watch how much salt and liquids you have.  It will help you to manage your heart failure.    Notes: Nurse will send [facility] CHF packet that has CHF zones and actions plans. Patient weigh 3 times weekly at dialysis, she is unable to balance on home scales due to bilateral partial foot amputations. Dialysis does provide her with a printout of her recorded weights and B/P    .  Track and Manage Symptoms        Follow Up Date 07/26/20    - develop a rescue plan - follow rescue plan if symptoms flare-up - know when to call the doctor - track symptoms and what helps feel better or worse - dress right for the weather, hot or cold    Why is this important?   You will be able to handle your symptoms better if you keep track of them.  Making some  simple changes to your  lifestyle will help.  Eating healthy is one thing you can do to take good care of yourself.    Notes: Nurse will send patient a B/P cuff. Patient gets weighed and has her B/P taken 3 times weekly where it is recorded and given to her on a printout.      Plan: RN Health Coach will send PCP a barrier letter and today's assessment note, will send a referral to pharmacy for medication assistance, will send patient a B/P cuff, chair exercise printouts, CHF education, hypertension education, and Advance Directive documents, will call patient within the month of December, and patient agrees to future outreach calls.   Emelia Loron RN, BSN [place] [phone] [email or web address]

## 2020-05-31 DIAGNOSIS — Z992 Dependence on renal dialysis: Secondary | ICD-10-CM | POA: Diagnosis not present

## 2020-05-31 DIAGNOSIS — D631 Anemia in chronic kidney disease: Secondary | ICD-10-CM | POA: Diagnosis not present

## 2020-05-31 DIAGNOSIS — E876 Hypokalemia: Secondary | ICD-10-CM | POA: Diagnosis not present

## 2020-05-31 DIAGNOSIS — N186 End stage renal disease: Secondary | ICD-10-CM | POA: Diagnosis not present

## 2020-05-31 DIAGNOSIS — N2581 Secondary hyperparathyroidism of renal origin: Secondary | ICD-10-CM | POA: Diagnosis not present

## 2020-05-31 DIAGNOSIS — D509 Iron deficiency anemia, unspecified: Secondary | ICD-10-CM | POA: Diagnosis not present

## 2020-06-03 DIAGNOSIS — N186 End stage renal disease: Secondary | ICD-10-CM | POA: Diagnosis not present

## 2020-06-03 DIAGNOSIS — E876 Hypokalemia: Secondary | ICD-10-CM | POA: Diagnosis not present

## 2020-06-03 DIAGNOSIS — D631 Anemia in chronic kidney disease: Secondary | ICD-10-CM | POA: Diagnosis not present

## 2020-06-03 DIAGNOSIS — D509 Iron deficiency anemia, unspecified: Secondary | ICD-10-CM | POA: Diagnosis not present

## 2020-06-03 DIAGNOSIS — N2581 Secondary hyperparathyroidism of renal origin: Secondary | ICD-10-CM | POA: Diagnosis not present

## 2020-06-03 DIAGNOSIS — Z992 Dependence on renal dialysis: Secondary | ICD-10-CM | POA: Diagnosis not present

## 2020-06-04 ENCOUNTER — Ambulatory Visit (INDEPENDENT_AMBULATORY_CARE_PROVIDER_SITE_OTHER): Payer: Medicare Other | Admitting: *Deleted

## 2020-06-04 DIAGNOSIS — J309 Allergic rhinitis, unspecified: Secondary | ICD-10-CM | POA: Diagnosis not present

## 2020-06-05 DIAGNOSIS — E876 Hypokalemia: Secondary | ICD-10-CM | POA: Diagnosis not present

## 2020-06-05 DIAGNOSIS — D631 Anemia in chronic kidney disease: Secondary | ICD-10-CM | POA: Diagnosis not present

## 2020-06-05 DIAGNOSIS — N186 End stage renal disease: Secondary | ICD-10-CM | POA: Diagnosis not present

## 2020-06-05 DIAGNOSIS — D509 Iron deficiency anemia, unspecified: Secondary | ICD-10-CM | POA: Diagnosis not present

## 2020-06-05 DIAGNOSIS — Z992 Dependence on renal dialysis: Secondary | ICD-10-CM | POA: Diagnosis not present

## 2020-06-05 DIAGNOSIS — N2581 Secondary hyperparathyroidism of renal origin: Secondary | ICD-10-CM | POA: Diagnosis not present

## 2020-06-07 DIAGNOSIS — N2581 Secondary hyperparathyroidism of renal origin: Secondary | ICD-10-CM | POA: Diagnosis not present

## 2020-06-07 DIAGNOSIS — Z992 Dependence on renal dialysis: Secondary | ICD-10-CM | POA: Diagnosis not present

## 2020-06-07 DIAGNOSIS — E876 Hypokalemia: Secondary | ICD-10-CM | POA: Diagnosis not present

## 2020-06-07 DIAGNOSIS — D631 Anemia in chronic kidney disease: Secondary | ICD-10-CM | POA: Diagnosis not present

## 2020-06-07 DIAGNOSIS — D509 Iron deficiency anemia, unspecified: Secondary | ICD-10-CM | POA: Diagnosis not present

## 2020-06-07 DIAGNOSIS — N186 End stage renal disease: Secondary | ICD-10-CM | POA: Diagnosis not present

## 2020-06-10 DIAGNOSIS — N2581 Secondary hyperparathyroidism of renal origin: Secondary | ICD-10-CM | POA: Diagnosis not present

## 2020-06-10 DIAGNOSIS — Z992 Dependence on renal dialysis: Secondary | ICD-10-CM | POA: Diagnosis not present

## 2020-06-10 DIAGNOSIS — D509 Iron deficiency anemia, unspecified: Secondary | ICD-10-CM | POA: Diagnosis not present

## 2020-06-10 DIAGNOSIS — D631 Anemia in chronic kidney disease: Secondary | ICD-10-CM | POA: Diagnosis not present

## 2020-06-10 DIAGNOSIS — N186 End stage renal disease: Secondary | ICD-10-CM | POA: Diagnosis not present

## 2020-06-10 DIAGNOSIS — E876 Hypokalemia: Secondary | ICD-10-CM | POA: Diagnosis not present

## 2020-06-11 ENCOUNTER — Ambulatory Visit (INDEPENDENT_AMBULATORY_CARE_PROVIDER_SITE_OTHER): Payer: Medicare Other

## 2020-06-11 DIAGNOSIS — J309 Allergic rhinitis, unspecified: Secondary | ICD-10-CM

## 2020-06-12 DIAGNOSIS — Z992 Dependence on renal dialysis: Secondary | ICD-10-CM | POA: Diagnosis not present

## 2020-06-12 DIAGNOSIS — E876 Hypokalemia: Secondary | ICD-10-CM | POA: Diagnosis not present

## 2020-06-12 DIAGNOSIS — D631 Anemia in chronic kidney disease: Secondary | ICD-10-CM | POA: Diagnosis not present

## 2020-06-12 DIAGNOSIS — N186 End stage renal disease: Secondary | ICD-10-CM | POA: Diagnosis not present

## 2020-06-12 DIAGNOSIS — D509 Iron deficiency anemia, unspecified: Secondary | ICD-10-CM | POA: Diagnosis not present

## 2020-06-12 DIAGNOSIS — N2581 Secondary hyperparathyroidism of renal origin: Secondary | ICD-10-CM | POA: Diagnosis not present

## 2020-06-14 DIAGNOSIS — H25812 Combined forms of age-related cataract, left eye: Secondary | ICD-10-CM | POA: Diagnosis not present

## 2020-06-15 DIAGNOSIS — D631 Anemia in chronic kidney disease: Secondary | ICD-10-CM | POA: Diagnosis not present

## 2020-06-15 DIAGNOSIS — D509 Iron deficiency anemia, unspecified: Secondary | ICD-10-CM | POA: Diagnosis not present

## 2020-06-15 DIAGNOSIS — Z992 Dependence on renal dialysis: Secondary | ICD-10-CM | POA: Diagnosis not present

## 2020-06-15 DIAGNOSIS — E876 Hypokalemia: Secondary | ICD-10-CM | POA: Diagnosis not present

## 2020-06-15 DIAGNOSIS — N2581 Secondary hyperparathyroidism of renal origin: Secondary | ICD-10-CM | POA: Diagnosis not present

## 2020-06-15 DIAGNOSIS — N186 End stage renal disease: Secondary | ICD-10-CM | POA: Diagnosis not present

## 2020-06-17 DIAGNOSIS — D509 Iron deficiency anemia, unspecified: Secondary | ICD-10-CM | POA: Diagnosis not present

## 2020-06-17 DIAGNOSIS — N2581 Secondary hyperparathyroidism of renal origin: Secondary | ICD-10-CM | POA: Diagnosis not present

## 2020-06-17 DIAGNOSIS — E876 Hypokalemia: Secondary | ICD-10-CM | POA: Diagnosis not present

## 2020-06-17 DIAGNOSIS — N186 End stage renal disease: Secondary | ICD-10-CM | POA: Diagnosis not present

## 2020-06-17 DIAGNOSIS — Z992 Dependence on renal dialysis: Secondary | ICD-10-CM | POA: Diagnosis not present

## 2020-06-17 DIAGNOSIS — D631 Anemia in chronic kidney disease: Secondary | ICD-10-CM | POA: Diagnosis not present

## 2020-06-18 ENCOUNTER — Ambulatory Visit (INDEPENDENT_AMBULATORY_CARE_PROVIDER_SITE_OTHER): Payer: Medicare Other | Admitting: *Deleted

## 2020-06-18 DIAGNOSIS — J309 Allergic rhinitis, unspecified: Secondary | ICD-10-CM | POA: Diagnosis not present

## 2020-06-19 DIAGNOSIS — D631 Anemia in chronic kidney disease: Secondary | ICD-10-CM | POA: Diagnosis not present

## 2020-06-19 DIAGNOSIS — N186 End stage renal disease: Secondary | ICD-10-CM | POA: Diagnosis not present

## 2020-06-19 DIAGNOSIS — E876 Hypokalemia: Secondary | ICD-10-CM | POA: Diagnosis not present

## 2020-06-19 DIAGNOSIS — Z992 Dependence on renal dialysis: Secondary | ICD-10-CM | POA: Diagnosis not present

## 2020-06-19 DIAGNOSIS — D509 Iron deficiency anemia, unspecified: Secondary | ICD-10-CM | POA: Diagnosis not present

## 2020-06-19 DIAGNOSIS — N2581 Secondary hyperparathyroidism of renal origin: Secondary | ICD-10-CM | POA: Diagnosis not present

## 2020-06-21 DIAGNOSIS — N2581 Secondary hyperparathyroidism of renal origin: Secondary | ICD-10-CM | POA: Diagnosis not present

## 2020-06-21 DIAGNOSIS — E876 Hypokalemia: Secondary | ICD-10-CM | POA: Diagnosis not present

## 2020-06-21 DIAGNOSIS — Z992 Dependence on renal dialysis: Secondary | ICD-10-CM | POA: Diagnosis not present

## 2020-06-21 DIAGNOSIS — D631 Anemia in chronic kidney disease: Secondary | ICD-10-CM | POA: Diagnosis not present

## 2020-06-21 DIAGNOSIS — D509 Iron deficiency anemia, unspecified: Secondary | ICD-10-CM | POA: Diagnosis not present

## 2020-06-21 DIAGNOSIS — N186 End stage renal disease: Secondary | ICD-10-CM | POA: Diagnosis not present

## 2020-06-24 DIAGNOSIS — D509 Iron deficiency anemia, unspecified: Secondary | ICD-10-CM | POA: Diagnosis not present

## 2020-06-24 DIAGNOSIS — Z992 Dependence on renal dialysis: Secondary | ICD-10-CM | POA: Diagnosis not present

## 2020-06-24 DIAGNOSIS — N186 End stage renal disease: Secondary | ICD-10-CM | POA: Diagnosis not present

## 2020-06-24 DIAGNOSIS — E876 Hypokalemia: Secondary | ICD-10-CM | POA: Diagnosis not present

## 2020-06-24 DIAGNOSIS — D631 Anemia in chronic kidney disease: Secondary | ICD-10-CM | POA: Diagnosis not present

## 2020-06-24 DIAGNOSIS — N2581 Secondary hyperparathyroidism of renal origin: Secondary | ICD-10-CM | POA: Diagnosis not present

## 2020-06-25 ENCOUNTER — Ambulatory Visit (INDEPENDENT_AMBULATORY_CARE_PROVIDER_SITE_OTHER): Payer: Medicare Other

## 2020-06-25 DIAGNOSIS — Z79899 Other long term (current) drug therapy: Secondary | ICD-10-CM | POA: Diagnosis not present

## 2020-06-25 DIAGNOSIS — J309 Allergic rhinitis, unspecified: Secondary | ICD-10-CM | POA: Diagnosis not present

## 2020-06-25 DIAGNOSIS — Z008 Encounter for other general examination: Secondary | ICD-10-CM | POA: Diagnosis not present

## 2020-06-25 DIAGNOSIS — R110 Nausea: Secondary | ICD-10-CM | POA: Diagnosis not present

## 2020-06-25 DIAGNOSIS — E1122 Type 2 diabetes mellitus with diabetic chronic kidney disease: Secondary | ICD-10-CM | POA: Diagnosis not present

## 2020-06-25 DIAGNOSIS — Z0000 Encounter for general adult medical examination without abnormal findings: Secondary | ICD-10-CM | POA: Diagnosis not present

## 2020-06-25 DIAGNOSIS — I132 Hypertensive heart and chronic kidney disease with heart failure and with stage 5 chronic kidney disease, or end stage renal disease: Secondary | ICD-10-CM | POA: Diagnosis not present

## 2020-06-25 DIAGNOSIS — F419 Anxiety disorder, unspecified: Secondary | ICD-10-CM | POA: Diagnosis not present

## 2020-06-25 DIAGNOSIS — H269 Unspecified cataract: Secondary | ICD-10-CM | POA: Diagnosis not present

## 2020-06-25 DIAGNOSIS — R053 Chronic cough: Secondary | ICD-10-CM | POA: Diagnosis not present

## 2020-06-25 DIAGNOSIS — N186 End stage renal disease: Secondary | ICD-10-CM | POA: Diagnosis not present

## 2020-06-25 DIAGNOSIS — K219 Gastro-esophageal reflux disease without esophagitis: Secondary | ICD-10-CM | POA: Diagnosis not present

## 2020-06-25 DIAGNOSIS — Z1159 Encounter for screening for other viral diseases: Secondary | ICD-10-CM | POA: Diagnosis not present

## 2020-06-26 DIAGNOSIS — D631 Anemia in chronic kidney disease: Secondary | ICD-10-CM | POA: Diagnosis not present

## 2020-06-26 DIAGNOSIS — D509 Iron deficiency anemia, unspecified: Secondary | ICD-10-CM | POA: Diagnosis not present

## 2020-06-26 DIAGNOSIS — E876 Hypokalemia: Secondary | ICD-10-CM | POA: Diagnosis not present

## 2020-06-26 DIAGNOSIS — I159 Secondary hypertension, unspecified: Secondary | ICD-10-CM | POA: Diagnosis not present

## 2020-06-26 DIAGNOSIS — T782XXA Anaphylactic shock, unspecified, initial encounter: Secondary | ICD-10-CM | POA: Diagnosis not present

## 2020-06-26 DIAGNOSIS — Z23 Encounter for immunization: Secondary | ICD-10-CM | POA: Diagnosis not present

## 2020-06-26 DIAGNOSIS — N186 End stage renal disease: Secondary | ICD-10-CM | POA: Diagnosis not present

## 2020-06-26 DIAGNOSIS — Z992 Dependence on renal dialysis: Secondary | ICD-10-CM | POA: Diagnosis not present

## 2020-06-26 DIAGNOSIS — N2581 Secondary hyperparathyroidism of renal origin: Secondary | ICD-10-CM | POA: Diagnosis not present

## 2020-06-28 DIAGNOSIS — T782XXA Anaphylactic shock, unspecified, initial encounter: Secondary | ICD-10-CM | POA: Diagnosis not present

## 2020-06-28 DIAGNOSIS — N2581 Secondary hyperparathyroidism of renal origin: Secondary | ICD-10-CM | POA: Diagnosis not present

## 2020-06-28 DIAGNOSIS — D509 Iron deficiency anemia, unspecified: Secondary | ICD-10-CM | POA: Diagnosis not present

## 2020-06-28 DIAGNOSIS — Z992 Dependence on renal dialysis: Secondary | ICD-10-CM | POA: Diagnosis not present

## 2020-06-28 DIAGNOSIS — E876 Hypokalemia: Secondary | ICD-10-CM | POA: Diagnosis not present

## 2020-06-28 DIAGNOSIS — N186 End stage renal disease: Secondary | ICD-10-CM | POA: Diagnosis not present

## 2020-07-01 DIAGNOSIS — Z992 Dependence on renal dialysis: Secondary | ICD-10-CM | POA: Diagnosis not present

## 2020-07-01 DIAGNOSIS — E876 Hypokalemia: Secondary | ICD-10-CM | POA: Diagnosis not present

## 2020-07-01 DIAGNOSIS — D509 Iron deficiency anemia, unspecified: Secondary | ICD-10-CM | POA: Diagnosis not present

## 2020-07-01 DIAGNOSIS — T782XXA Anaphylactic shock, unspecified, initial encounter: Secondary | ICD-10-CM | POA: Diagnosis not present

## 2020-07-01 DIAGNOSIS — N2581 Secondary hyperparathyroidism of renal origin: Secondary | ICD-10-CM | POA: Diagnosis not present

## 2020-07-01 DIAGNOSIS — N186 End stage renal disease: Secondary | ICD-10-CM | POA: Diagnosis not present

## 2020-07-02 ENCOUNTER — Encounter (INDEPENDENT_AMBULATORY_CARE_PROVIDER_SITE_OTHER): Payer: Self-pay | Admitting: Ophthalmology

## 2020-07-02 ENCOUNTER — Ambulatory Visit (INDEPENDENT_AMBULATORY_CARE_PROVIDER_SITE_OTHER): Payer: Medicare Other | Admitting: *Deleted

## 2020-07-02 ENCOUNTER — Other Ambulatory Visit: Payer: Self-pay

## 2020-07-02 ENCOUNTER — Ambulatory Visit (INDEPENDENT_AMBULATORY_CARE_PROVIDER_SITE_OTHER): Payer: Medicare Other | Admitting: Ophthalmology

## 2020-07-02 DIAGNOSIS — I779 Disorder of arteries and arterioles, unspecified: Secondary | ICD-10-CM

## 2020-07-02 DIAGNOSIS — IMO0002 Reserved for concepts with insufficient information to code with codable children: Secondary | ICD-10-CM

## 2020-07-02 DIAGNOSIS — J309 Allergic rhinitis, unspecified: Secondary | ICD-10-CM

## 2020-07-02 DIAGNOSIS — E113512 Type 2 diabetes mellitus with proliferative diabetic retinopathy with macular edema, left eye: Secondary | ICD-10-CM

## 2020-07-02 DIAGNOSIS — E113511 Type 2 diabetes mellitus with proliferative diabetic retinopathy with macular edema, right eye: Secondary | ICD-10-CM | POA: Insufficient documentation

## 2020-07-02 DIAGNOSIS — Z961 Presence of intraocular lens: Secondary | ICD-10-CM

## 2020-07-02 DIAGNOSIS — Z794 Long term (current) use of insulin: Secondary | ICD-10-CM

## 2020-07-02 DIAGNOSIS — E113521 Type 2 diabetes mellitus with proliferative diabetic retinopathy with traction retinal detachment involving the macula, right eye: Secondary | ICD-10-CM | POA: Diagnosis not present

## 2020-07-02 DIAGNOSIS — E1165 Type 2 diabetes mellitus with hyperglycemia: Secondary | ICD-10-CM | POA: Diagnosis not present

## 2020-07-02 MED ORDER — BEVACIZUMAB 2.5 MG/0.1ML IZ SOSY
2.5000 mg | PREFILLED_SYRINGE | INTRAVITREAL | Status: AC | PRN
  Administered 2020-07-02: 2.5 mg via INTRAVITREAL

## 2020-07-02 NOTE — Assessment & Plan Note (Signed)
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

## 2020-07-02 NOTE — Progress Notes (Signed)
07/02/2020     CHIEF COMPLAINT Patient presents for Retina Follow Up   HISTORY OF PRESENT ILLNESS: Destiny Day is a 69 y.o. female who presents to the clinic today for:   HPI    Retina Follow Up    Patient presents with  Diabetic Retinopathy.  In both eyes.  This started 6 weeks ago.  Severity is mild.  Duration of 6 weeks.  Since onset it is stable.          Comments    6 Week F/U OU  Pt sts [facility] OU is "a little bit better." Pt denies new symptoms OU.  LBS: 130 "something" yesterday AM       Last edited by Rockie Neighbours, [place] on 07/02/2020  2:02 PM. (History)      Referring physician: Cipriano Mile, NP [place],  [place] [id number]  HISTORICAL INFORMATION:   Selected notes from the MEDICAL RECORD NUMBER    Lab Results  Component Value Date   HGBA1C 5.6 06/26/2019     CURRENT MEDICATIONS: No current outpatient medications on file. (Ophthalmic Drugs)   No current facility-administered medications for this visit. (Ophthalmic Drugs)   Current Outpatient Medications (Other)  Medication Sig  . albuterol (PROAIR HFA) 108 (90 Base) MCG/ACT inhaler 4 puffs every 4-6 hours as needed  . amLODipine (NORVASC) 10 MG tablet Take 10 mg by mouth at bedtime.  Marland Kitchen aspirin EC 81 MG tablet Take 81 mg by mouth daily.  Marland Kitchen atorvastatin (LIPITOR) 40 MG tablet Take 1 tablet (40 mg total) by mouth daily.  Marland Kitchen azelastine (ASTELIN) 0.1 % nasal spray Place 1 spray into both nostrils 2 (two) times daily.  . benzonatate (TESSALON PERLES) 100 MG capsule Take 1 capsule (100 mg total) by mouth 3 (three) times daily as needed for cough.  Marland Kitchen BIDIL 20-37.5 MG tablet TAKE ONE TABLET BY MOUTH THREE TIMES A DAY   . budesonide-formoterol (SYMBICORT) 160-4.5 MCG/ACT inhaler 2 puffs twice daily with spacer (Patient not taking: Reported on 05/30/2020)  . busPIRone (BUSPAR) 7.5 MG tablet 7.5 mg.   . carvedilol (COREG) 12.5 MG tablet Take 12.5 mg by mouth See admin instructions. Take 1 tablet  (12.5 mg) by mouth once daily in the evening on Mondays, Wednesdays, & Fridays. (Dialysis) Take 1 tablet (12.5 mg) by mouth twice daily on Sundays, Tuesdays, Thursdays, & Saturdays. (Non Dialysis)  . cinacalcet (SENSIPAR) 60 MG tablet Take 90 mg by mouth See admin instructions. Take 1 tablet (90 mg) by mouth in the evening on Mondays, Wednesdays, & Fridays (dialysis) Take 1 tablet (90 mg) by mouth twice daily on Sundays, Tuesdays, Thursdays, Saturdays. (non dialysis)  . diazepam (VALIUM) 5 MG tablet Take 5 mg by mouth 2 (two) times daily as needed for anxiety.  . docusate sodium (COLACE) 100 MG capsule Take 1 capsule (100 mg total) by mouth 2 (two) times daily.  Marland Kitchen EPINEPHrine 0.3 mg/0.3 mL IJ SOAJ injection Inject 0.3 mLs (0.3 mg total) into the muscle as needed for anaphylaxis. (Patient not taking: Reported on 05/30/2020)  . ferric citrate (AURYXIA) 1 GM 210 MG(Fe) tablet Take 210-420 mg by mouth See admin instructions. Take 2 tablets (420 mg) by mouth with each meal & take 1 tablet (210 mg) by mouth with snacks.  . fluticasone (FLONASE) 50 MCG/ACT nasal spray Place 2 sprays into both nostrils daily as needed for allergies or rhinitis.  Marland Kitchen gabapentin (NEURONTIN) 100 MG capsule TAKE 1 CAPSULE (100 MG TOTAL) BY MOUTH AT  BEDTIME. WHEN NECESSARY FOR NEUROPATHY PAIN  . glipiZIDE (GLUCOTROL) 5 MG tablet Take 5 mg by mouth daily before breakfast.  . guaiFENesin-codeine (ROBITUSSIN AC) 100-10 MG/5ML syrup Take by mouth.  . lidocaine-prilocaine (EMLA) cream Apply 1 application topically 3 (three) times a week. 1-2 hours prior to dialysis  . LOPERAMIDE HCL PO Take by mouth.  . montelukast (SINGULAIR) 10 MG tablet Take 1 tablet (10 mg total) by mouth at bedtime.  . multivitamin (RENA-VIT) TABS tablet Take 1 tablet by mouth daily.   Marland Kitchen omeprazole (PRILOSEC) 20 MG capsule Take 20 mg by mouth daily.   . ondansetron (ZOFRAN) 4 MG tablet Take 1 tablet (4 mg total) by mouth every 6 (six) hours as needed for nausea.    . polyethylene glycol (MIRALAX / GLYCOLAX) 17 g packet Take 17 g by mouth daily.  . traZODone (DESYREL) 50 MG tablet   . Vitamin D, Ergocalciferol, (DRISDOL) 1.25 MG (50000 UT) CAPS capsule Take 50,000 Units by mouth every Sunday. Takes on Sunday   . XYZAL ALLERGY 24HR 5 MG tablet Take 5 mg by mouth daily.   No current facility-administered medications for this visit. (Other)      REVIEW OF SYSTEMS:    ALLERGIES Allergies  Allergen Reactions  . Adhesive  [Tape] Hives  . Lisinopril Cough  . Prednisone Swelling and Other (See Comments)    Excessive fluid buildup    PAST MEDICAL HISTORY Past Medical History:  Diagnosis Date  . Abdominal bruit   . Anemia   . Anxiety   . Arthritis    Osteoarthritis  . Asthma   . Cervical disc disease    "pinced nerve"  . CHF (congestive heart failure) ([place])   . Complication of anesthesia    " after I got home from my last procedure, I started itching."  . Diabetes mellitus    Type II  . Diverticulitis   . ESRD (end stage renal disease) ([place])    dialysis - M/W/F- [place]  . GERD (gastroesophageal reflux disease)    from medications  . GI bleed 03/31/2013  . Head injury 07/2017  . History of hiatal hernia   . Hyperlipidemia   . Hypertension   . Neuropathy    left leg  . Osteoporosis   . Peripheral vascular disease ([place])   . Pneumonia    "very young" and a few years ago  . PONV (postoperative nausea and vomiting)   . Seasonal allergies   . Shortness of breath dyspnea    WIth exertion, when fluid builds  . Sleep apnea    can't afford cpap    Past Surgical History:  Procedure Laterality Date  . A/V SHUNTOGRAM N/A 09/22/2016   Procedure: A/V Shuntogram - left arm;  Surgeon: Serafina Mitchell, MD;  Location: [facility];  Service: Cardiovascular;  Laterality: N/A;  . A/V SHUNTOGRAM N/A 03/22/2018   Procedure: A/V SHUNTOGRAM - left arm;  Surgeon: Serafina Mitchell, MD;  Location: [facility] [facility];  Service: Cardiovascular;   Laterality: N/A;  . A/V SHUNTOGRAM Left 11/17/2018   Procedure: A/V SHUNTOGRAM;  Surgeon: Angelia Mould, MD;  Location: [facility];  Service: Cardiovascular;  Laterality: Left;  . ABDOMINAL HYSTERECTOMY  1993`  . AMPUTATION Left 09/01/2016   Procedure: LEFT FOOT TRANSMETATARSAL AMPUTATION;  Surgeon: Newt Minion, MD;  Location: [facility];  Service: Orthopedics;  Laterality: Left;  . AMPUTATION Right 08/11/2017   Procedure: RIGHT GREAT TOE AMPUTATION DIGIT;  Surgeon: Donnetta Hutching,  Arvilla Meres, MD;  Location: [facility];  Service: Vascular;  Laterality: Right;  . AMPUTATION Right 12/31/2017   Procedure: RIGHT TRANSMETATARSAL AMPUTATION;  Surgeon: Newt Minion, MD;  Location: [facility];  Service: Orthopedics;  Laterality: Right;  . AV FISTULA PLACEMENT Left 04/21/2016   Procedure: INSERTION OF ARTERIOVENOUS (AV) GORE-TEX GRAFT ARM LEFT;  Surgeon: Elam Dutch, MD;  Location: [facility];  Service: Vascular;  Laterality: Left;  . [place] TRANSPOSITION Left 07/10/2014   Procedure: BASCILIC VEIN TRANSPOSITION;  Surgeon: Angelia Mould, MD;  Location: [facility];  Service: Vascular;  Laterality: Left;  . BASCILIC VEIN TRANSPOSITION Right 11/08/2014   Procedure: FIRST STAGE BASILIC VEIN TRANSPOSITION;  Surgeon: Angelia Mould, MD;  Location: [facility];  Service: Vascular;  Laterality: Right;  . BASCILIC VEIN TRANSPOSITION Right 01/18/2015   Procedure: SECOND STAGE BASILIC VEIN TRANSPOSITION;  Surgeon: Angelia Mould, MD;  Location: [facility];  Service: Vascular;  Laterality: Right;  . CRANIOTOMY N/A 08/23/2017   Procedure: CRANIOTOMY HEMATOMA EVACUATION SUBDURAL;  Surgeon: Ashok Pall, MD;  Location: [facility];  Service: Neurosurgery;  Laterality: N/A;  . CRANIOTOMY Left 08/24/2017   Procedure: CRANIOTOMY FOR RECURRENT ACUTE SUBDURAL HEMATOMA;  Surgeon: Ashok Pall, MD;  Location: [facility];  Service: Neurosurgery;  Laterality: Left;  . ESOPHAGOGASTRODUODENOSCOPY N/A 03/31/2013   Procedure:  ESOPHAGOGASTRODUODENOSCOPY (EGD);  Surgeon: Gatha Mayer, MD;  Location[facility];  Service: Endoscopy;  Laterality: N/A;  . EYE SURGERY     laser surgery  . FEMORAL-POPLITEAL BYPASS GRAFT Left 07/08/2016   Procedure: LEFT  FEMORAL-BELOW KNEE POPLITEAL ARTERY BYPASS GRAFT USING 6MM X 80 CM PROPATEN GORETEX GRAFT WITH RINGS.;  Surgeon: Rosetta Posner, MD;  Location: [facility];  Service: Vascular;  Laterality: Left;  . FEMORAL-POPLITEAL BYPASS GRAFT Right 08/11/2017   Procedure: RIGHT FEMORAL TO BELOW KNEE POPLITEAL ARTERKY  BYPASS GRAFT USING 6MM RINGED PROPATEN GRAFT;  Surgeon: Rosetta Posner, MD;  Location: [facility];  Service: Vascular;  Laterality: Right;  . FISTULOGRAM Left 10/29/2014   Procedure: FISTULOGRAM;  Surgeon: Angelia Mould, MD;  Location: [facility] CATH LAB;  Service: Cardiovascular;  Laterality: Left;  Marland Kitchen GAS/FLUID EXCHANGE Left 12/20/2018   Procedure: AIR GAS EXCHANGE;  Surgeon: Jalene Mullet, MD;  Location: [place];  Service: Ophthalmology;  Laterality: Left;  . KNEE ARTHROSCOPY Right 05/06/2018   Procedure: RIGHT KNEE ARTHROSCOPY;  Surgeon: Newt Minion, MD;  Location: [facility];  Service: Orthopedics;  Laterality: Right;  . KNEE ARTHROSCOPY Right 06/10/2018   Procedure: RIGHT KNEE ARTHROSCOPY AND DEBRIDEMENT;  Surgeon: Newt Minion, MD;  Location: [facility];  Service: Orthopedics;  Laterality: Right;  . LIGATION OF ARTERIOVENOUS  FISTULA Left 04/21/2016   Procedure: LIGATION OF ARTERIOVENOUS  FISTULA LEFT ARM;  Surgeon: Elam Dutch, MD;  Location: [facility];  Service: Vascular;  Laterality: Left;  . LOWER EXTREMITY ANGIOGRAPHY N/A 04/06/2017   Procedure: Lower Extremity Angiography - Right;  Surgeon: Serafina Mitchell, MD;  Location: [facility] [facility];  Service: Cardiovascular;  Laterality: N/A;  . MEMBRANE PEEL Left 12/20/2018   Procedure: MEMBRANE PEEL;  Surgeon: Jalene Mullet, MD;  Location: [facility];  Service: Ophthalmology;  Laterality: Left;  . PATCH ANGIOPLASTY Right 01/18/2015    Procedure: BASILIC VEIN PATCH ANGIOPLASTY USING VASCUGUARD PATCH;  Surgeon: Angelia Mould, MD;  Location: [facility];  Service: Vascular;  Laterality: Right;  . PERIPHERAL VASCULAR BALLOON ANGIOPLASTY  09/22/2016   Procedure: Peripheral Vascular Balloon Angioplasty;  Surgeon: Butch Penny  Trula Slade, MD;  Location: [facility];  Service: Cardiovascular;;  Lt. Fistula  . PERIPHERAL VASCULAR BALLOON ANGIOPLASTY Left 03/22/2018   Procedure: PERIPHERAL VASCULAR BALLOON ANGIOPLASTY;  Surgeon: Serafina Mitchell, MD;  Location: [facility];  Service: Cardiovascular;  Laterality: Left;  Arm shunt  . PERIPHERAL VASCULAR CATHETERIZATION N/A 06/23/2016   Procedure: Abdominal Aortogram w/Lower Extremity;  Surgeon: Serafina Mitchell, MD;  Location: [facility];  Service: Cardiovascular;  Laterality: N/A;  . PERIPHERAL VASCULAR CATHETERIZATION  06/23/2016   Procedure: Peripheral Vascular Intervention;  Surgeon: Serafina Mitchell, MD;  Location: [facility];  Service: Cardiovascular;;  lt common and external illiac artery  . PHOTOCOAGULATION WITH LASER Left 12/20/2018   Procedure: PHOTOCOAGULATION WITH LASER;  Surgeon: Jalene Mullet, MD;  Location: [facility];  Service: Ophthalmology;  Laterality: Left;  . 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 Other Mother        not sure of cause of death  . Diabetes Father   . Pancreatic cancer Maternal Grandmother   . Diabetes Sister   . Diabetes Sister   . Colon cancer Neg Hx     SOCIAL HISTORY Social History   Tobacco Use  . Smoking status: Former Smoker    Packs/day: 0.35    Years: 40.00    Pack years: 14.00    Types: Cigarettes    Quit date: 05/10/2012    Years since quitting: 8.1  . Smokeless tobacco:  Never Used  Vaping Use  . Vaping Use: Never used  Substance Use Topics  . Alcohol use: No  . Drug use: No    Comment: marijuana; quit in early 1980's         OPHTHALMIC EXAM:  Base Eye Exam    Visual Acuity (ETDRS)      Right Left   Dist [place] 20/50 -2 20/100 -2   Dist ph  NI NI   Correction: Glasses       Tonometry (Tonopen, 2:02 PM)      Right Left   Pressure 14 18       Pupils      Pupils Dark Light Shape React APD   Right PERRL 3 3 Round Minimal None   Left PERRL 3 3 Round Minimal None       Visual Fields (Counting fingers)      Left Right    Full Full       Extraocular Movement      Right Left    Full Full       Neuro/Psych    Oriented x3: Yes   Mood/Affect: Normal       Dilation    Both eyes: 1.0% Mydriacyl, 2.5% Phenylephrine @ 2:07 PM        Slit Lamp and Fundus Exam    External Exam      Right Left   External Normal Normal       Slit Lamp Exam      Right Left   Lids/Lashes Normal Normal   Conjunctiva/Sclera White and quiet White and quiet   Cornea Clear Clear   Anterior Chamber Deep and quiet Deep and quiet   Iris Round and reactive Round and reactive   Lens Nuclear sclerosis 3+, 2+  Cortical cataract Centered posterior chamber intraocular lens   Anterior Vitreous Normal Normal       Fundus Exam      Right Left   Posterior Vitreous Pre-retinal hemorrhage Vitrectomized, by history   Disc Neovascularization No details through cataract   C/D Ratio 0.5 0.3   Macula Microaneurysms Poor details through the dense cataract   Vessels PDR-active Good PRP, glimpses   Periphery  moderate scattter  attached peripherally          IMAGING AND PROCEDURES  Imaging and Procedures for 07/02/20  OCT, Retina - OU - Both Eyes       Right Eye Quality was good. Scan locations included subfoveal. Central Foveal Thickness: 364. Progression has been stable. Findings include epiretinal membrane, vitreomacular adhesion , cystoid macular edema.    Left Eye Quality was good. Scan locations included subfoveal. Central Foveal Thickness: 480. Progression has worsened. Findings include abnormal foveal contour, cystoid macular edema.   Notes OD with vitreomacular traction, residual CME of CSME centrally OD  Active PDR does require peripheral PRP OD today  OS with massive CSME will needIntravitreal Avastin OS in the near future       Color Fundus Photography Optos - OU - Both Eyes       Right Eye Macula : microaneurysms. Vessels : Neovascularization. Periphery : neovascularization.   Left Eye Progression has been stable. Disc findings include pallor. Macula : microaneurysms.   Notes OD with active neovascularization inferonasal to the nerve as well as inferotemporal to the macula with preretinal hemorrhage OD will need PRP temporally and inferiorly today  OS good PRP 360, some optic atrophy limits acuity, the size been vitrectomized and has moderate active maculopathy temporally       Intravitreal Injection, Pharmacologic Agent - OS - Left Eye       Time Out 07/02/2020. 3:26 PM. Confirmed correct patient, procedure, site, and patient consented.   Anesthesia Topical anesthesia was used. Anesthetic medications included [place] 3.5%.   Procedure Preparation included 5% betadine to ocular surface, 10% betadine to eyelids. A 30 gauge needle was used.   Injection:  2.5 mg Bevacizumab (AVASTIN) 2.5mg /0.52mL SOSY   NDC: 56433-295-18, Lot: [id number]   Route: Intravitreal, Site: Left Eye  Post-op Post injection exam found visual acuity of at least counting fingers. The patient tolerated the procedure well. There were no complications. The patient received written and verbal post procedure care education. Post injection medications were not given.                 ASSESSMENT/PLAN:  Pseudophakia of left eye Now with residual CSME will need to treat this soon with intravitreal Avastin  Proliferative diabetic retinopathy  of left eye with macular edema associated with type 2 diabetes mellitus ([place]) Nodule intravitreal Avastin OS in the near future to improve and resolve CSME      ICD-10-CM   1. 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 Uncontrolled diabetes mellitus with right eye affected by proliferative retinopathy and traction retinal detachment involving macula, with long-term current use of insulin (HCC)  E11.3521 OCT, Retina - OU - Both Eyes   E11.65 Color Fundus Photography Optos - OU - Both Eyes  Z79.4   3. Pseudophakia of left eye  Z96.1   4. Diabetic macular edema of right eye with proliferative retinopathy associated with type 2 diabetes mellitus (HCC)  E11.3511     1. OS, status post recent cataract extraction with intraocular lens placement, pseudophakia looks great. Active CSME from focal leakages temporally will require intravitreal Avastin in the near future, or today we will schedule because this is less urgent than the findings OD  2. OD, with active neovascularization inferotemporal to the macula and inferonasal to the nerve with preretinal hemorrhage thus continued high risk progression. Patient is scheduled for cataract surgery early January 2022, will need completion of PRP OD to prevent rapid progression of PDR, in 2 days at 3:45 PM  3.  Patient would like to schedule the PRP OD in 2 days when she has a driver  Ophthalmic Meds Ordered this visit:  Meds ordered this encounter  Medications  . bevacizumab (AVASTIN) SOSY 2.5 mg       Return in about 1 week (around 07/09/2020) for PRP, OD, dilate.  There are no Patient Instructions on file for this visit.   Explained the diagnoses, plan, and follow up with the patient  and they expressed understanding.  Patient expressed understanding of the importance of proper follow up care.   Clent Demark Enis Leatherwood M.D. Diseases & Surgery of the Retina and Vitreous Retina & Diabetic [place] 07/02/20     Abbreviations: M myopia (nearsighted); A astigmatism; H hyperopia (farsighted); P presbyopia; Mrx spectacle prescription;  CTL contact lenses; OD right eye; OS left eye; OU both eyes  XT exotropia; ET esotropia; PEK punctate epithelial keratitis; PEE punctate epithelial erosions; DES dry eye syndrome; MGD meibomian gland dysfunction; ATs artificial tears; PFAT's preservative free artificial tears; [place] nuclear sclerotic cataract; PSC posterior subcapsular cataract; ERM epi-retinal membrane; PVD posterior vitreous detachment; RD retinal detachment; DM diabetes mellitus; DR diabetic retinopathy; NPDR non-proliferative diabetic retinopathy; PDR proliferative diabetic retinopathy; CSME clinically significant macular edema; DME diabetic macular edema; dbh dot blot hemorrhages; CWS cotton wool spot; POAG primary open angle glaucoma; C/D cup-to-disc ratio; HVF humphrey visual field; GVF goldmann visual field; OCT optical coherence tomography; IOP intraocular pressure; BRVO Branch retinal vein occlusion; CRVO central retinal vein occlusion; CRAO central retinal artery occlusion; BRAO branch retinal artery occlusion; RT retinal tear; SB scleral buckle; PPV pars plana vitrectomy; VH Vitreous hemorrhage; PRP panretinal laser photocoagulation; IVK intravitreal kenalog; VMT vitreomacular traction; MH Macular hole;  NVD neovascularization of the disc; NVE neovascularization elsewhere; AREDS age related eye disease study; ARMD age related macular degeneration; POAG primary open angle glaucoma; EBMD epithelial/anterior basement membrane dystrophy; ACIOL anterior chamber intraocular lens; IOL intraocular lens; PCIOL posterior chamber intraocular lens; Phaco/IOL phacoemulsification with intraocular lens  placement; [place] photorefractive keratectomy; LASIK laser assisted in situ keratomileusis; HTN hypertension; DM diabetes mellitus; COPD chronic obstructive pulmonary disease

## 2020-07-02 NOTE — Assessment & Plan Note (Signed)
Nodule intravitreal Avastin OS in the near future to improve and resolve CSME

## 2020-07-03 DIAGNOSIS — Z992 Dependence on renal dialysis: Secondary | ICD-10-CM | POA: Diagnosis not present

## 2020-07-03 DIAGNOSIS — N2581 Secondary hyperparathyroidism of renal origin: Secondary | ICD-10-CM | POA: Diagnosis not present

## 2020-07-03 DIAGNOSIS — D509 Iron deficiency anemia, unspecified: Secondary | ICD-10-CM | POA: Diagnosis not present

## 2020-07-03 DIAGNOSIS — T782XXA Anaphylactic shock, unspecified, initial encounter: Secondary | ICD-10-CM | POA: Diagnosis not present

## 2020-07-03 DIAGNOSIS — E876 Hypokalemia: Secondary | ICD-10-CM | POA: Diagnosis not present

## 2020-07-03 DIAGNOSIS — N186 End stage renal disease: Secondary | ICD-10-CM | POA: Diagnosis not present

## 2020-07-04 ENCOUNTER — Other Ambulatory Visit: Payer: Self-pay

## 2020-07-04 ENCOUNTER — Ambulatory Visit (INDEPENDENT_AMBULATORY_CARE_PROVIDER_SITE_OTHER): Payer: Medicare Other | Admitting: Ophthalmology

## 2020-07-04 ENCOUNTER — Encounter (INDEPENDENT_AMBULATORY_CARE_PROVIDER_SITE_OTHER): Payer: Self-pay | Admitting: Ophthalmology

## 2020-07-04 DIAGNOSIS — IMO0002 Reserved for concepts with insufficient information to code with codable children: Secondary | ICD-10-CM

## 2020-07-04 DIAGNOSIS — E113521 Type 2 diabetes mellitus with proliferative diabetic retinopathy with traction retinal detachment involving the macula, right eye: Secondary | ICD-10-CM

## 2020-07-04 DIAGNOSIS — E113512 Type 2 diabetes mellitus with proliferative diabetic retinopathy with macular edema, left eye: Secondary | ICD-10-CM

## 2020-07-04 DIAGNOSIS — E113511 Type 2 diabetes mellitus with proliferative diabetic retinopathy with macular edema, right eye: Secondary | ICD-10-CM

## 2020-07-04 NOTE — Progress Notes (Signed)
07/04/2020     CHIEF COMPLAINT Patient presents for Retina Follow Up (PRP OD///Pt reports stable vision, no new f/f, no pain or pressure. ////Last BS: around 120 Tues PM)   HISTORY OF PRESENT ILLNESS: Destiny Day is a 69 y.o. female who presents to the clinic today for:   HPI    Retina Follow Up    Patient presents with  Diabetic Retinopathy.  In right eye.  This started 2 days ago.  Duration of 2 days.  Since onset it is stable. Additional comments: PRP OD   Pt reports stable vision, no new f/f, no pain or pressure.     Last BS: around 120 Tues PM       Last edited by Nichola Sizer D on 07/04/2020  2:20 PM. (History)      Referring physician: Cipriano Mile, NP [place],  [place] [address]  HISTORICAL INFORMATION:   Selected notes from the MEDICAL RECORD NUMBER    Lab Results  Component Value Date   HGBA1C 5.6 06/26/2019     CURRENT MEDICATIONS: No current outpatient medications on file. (Ophthalmic Drugs)   No current facility-administered medications for this visit. (Ophthalmic Drugs)   Current Outpatient Medications (Other)  Medication Sig  . albuterol (PROAIR HFA) 108 (90 Base) MCG/ACT inhaler 4 puffs every 4-6 hours as needed  . amLODipine (NORVASC) 10 MG tablet Take 10 mg by mouth at bedtime.  Marland Kitchen aspirin EC 81 MG tablet Take 81 mg by mouth daily.  Marland Kitchen atorvastatin (LIPITOR) 40 MG tablet Take 1 tablet (40 mg total) by mouth daily.  Marland Kitchen azelastine (ASTELIN) 0.1 % nasal spray Place 1 spray into both nostrils 2 (two) times daily.  . benzonatate (TESSALON PERLES) 100 MG capsule Take 1 capsule (100 mg total) by mouth 3 (three) times daily as needed for cough.  Marland Kitchen BIDIL 20-37.5 MG tablet TAKE ONE TABLET BY MOUTH THREE TIMES A DAY   . budesonide-formoterol (SYMBICORT) 160-4.5 MCG/ACT inhaler 2 puffs twice daily with spacer (Patient not taking: Reported on 05/30/2020)  . busPIRone (BUSPAR) 7.5 MG tablet 7.5 mg.   . carvedilol (COREG) 12.5 MG  tablet Take 12.5 mg by mouth See admin instructions. Take 1 tablet (12.5 mg) by mouth once daily in the evening on Mondays, Wednesdays, & Fridays. (Dialysis) Take 1 tablet (12.5 mg) by mouth twice daily on Sundays, Tuesdays, Thursdays, & Saturdays. (Non Dialysis)  . cinacalcet (SENSIPAR) 60 MG tablet Take 90 mg by mouth See admin instructions. Take 1 tablet (90 mg) by mouth in the evening on Mondays, Wednesdays, & Fridays (dialysis) Take 1 tablet (90 mg) by mouth twice daily on Sundays, Tuesdays, Thursdays, Saturdays. (non dialysis)  . diazepam (VALIUM) 5 MG tablet Take 5 mg by mouth 2 (two) times daily as needed for anxiety.  . docusate sodium (COLACE) 100 MG capsule Take 1 capsule (100 mg total) by mouth 2 (two) times daily.  Marland Kitchen EPINEPHrine 0.3 mg/0.3 mL IJ SOAJ injection Inject 0.3 mLs (0.3 mg total) into the muscle as needed for anaphylaxis. (Patient not taking: Reported on 05/30/2020)  . ferric citrate (AURYXIA) 1 GM 210 MG(Fe) tablet Take 210-420 mg by mouth See admin instructions. Take 2 tablets (420 mg) by mouth with each meal & take 1 tablet (210 mg) by mouth with snacks.  . fluticasone (FLONASE) 50 MCG/ACT nasal spray Place 2 sprays into both nostrils daily as needed for allergies or rhinitis.  Marland Kitchen gabapentin (NEURONTIN) 100 MG capsule TAKE 1 CAPSULE (100 MG TOTAL)  BY MOUTH AT BEDTIME. WHEN NECESSARY FOR NEUROPATHY PAIN  . glipiZIDE (GLUCOTROL) 5 MG tablet Take 5 mg by mouth daily before breakfast.  . guaiFENesin-codeine (ROBITUSSIN AC) 100-10 MG/5ML syrup Take by mouth.  . lidocaine-prilocaine (EMLA) cream Apply 1 application topically 3 (three) times a week. 1-2 hours prior to dialysis  . LOPERAMIDE HCL PO Take by mouth.  . montelukast (SINGULAIR) 10 MG tablet Take 1 tablet (10 mg total) by mouth at bedtime.  . multivitamin (RENA-VIT) TABS tablet Take 1 tablet by mouth daily.   Marland Kitchen omeprazole (PRILOSEC) 20 MG capsule Take 20 mg by mouth daily.   . ondansetron (ZOFRAN) 4 MG tablet Take 1 tablet  (4 mg total) by mouth every 6 (six) hours as needed for nausea.  . polyethylene glycol (MIRALAX / GLYCOLAX) 17 g packet Take 17 g by mouth daily.  . traZODone (DESYREL) 50 MG tablet   . Vitamin D, Ergocalciferol, (DRISDOL) 1.25 MG (50000 UT) CAPS capsule Take 50,000 Units by mouth every Sunday. Takes on Sunday   . XYZAL ALLERGY 24HR 5 MG tablet Take 5 mg by mouth daily.   No current facility-administered medications for this visit. (Other)      REVIEW OF SYSTEMS:    ALLERGIES Allergies  Allergen Reactions  . Adhesive  [Tape] Hives  . Lisinopril Cough  . Prednisone Swelling and Other (See Comments)    Excessive fluid buildup    PAST MEDICAL HISTORY Past Medical History:  Diagnosis Date  . Abdominal bruit   . Anemia   . Anxiety   . Arthritis    Osteoarthritis  . Asthma   . Cervical disc disease    "pinced nerve"  . CHF (congestive heart failure) ([place])   . Complication of anesthesia    " after I got home from my last procedure, I started itching."  . Diabetes mellitus    Type II  . Diverticulitis   . ESRD (end stage renal disease) ([place])    dialysis - M/W/F- [place]  . GERD (gastroesophageal reflux disease)    from medications  . GI bleed 03/31/2013  . Head injury 07/2017  . History of hiatal hernia   . Hyperlipidemia   . Hypertension   . Neuropathy    left leg  . Osteoporosis   . Peripheral vascular disease ([place])   . Pneumonia    "very young" and a few years ago  . PONV (postoperative nausea and vomiting)   . Seasonal allergies   . Shortness of breath dyspnea    WIth exertion, when fluid builds  . Sleep apnea    can't afford cpap    Past Surgical History:  Procedure Laterality Date  . A/V SHUNTOGRAM N/A 09/22/2016   Procedure: A/V Shuntogram - left arm;  Surgeon: Serafina Mitchell, MD;  Location: [facility];  Service: Cardiovascular;  Laterality: N/A;  . A/V SHUNTOGRAM N/A 03/22/2018   Procedure: A/V SHUNTOGRAM - left arm;  Surgeon: Serafina Mitchell,  MD;  Location: [facility];  Service: Cardiovascular;  Laterality: N/A;  . A/V SHUNTOGRAM Left 11/17/2018   Procedure: A/V SHUNTOGRAM;  Surgeon: Angelia Mould, MD;  Location: [facility];  Service: Cardiovascular;  Laterality: Left;  . ABDOMINAL HYSTERECTOMY  1993`  . AMPUTATION Left 09/01/2016   Procedure: LEFT FOOT TRANSMETATARSAL AMPUTATION;  Surgeon: Newt Minion, MD;  Location: [facility];  Service: Orthopedics;  Laterality: Left;  . AMPUTATION Right 08/11/2017   Procedure: RIGHT GREAT TOE AMPUTATION DIGIT;  Surgeon: Rosetta Posner, MD;  Location: [facility];  Service: Vascular;  Laterality: Right;  . AMPUTATION Right 12/31/2017   Procedure: RIGHT TRANSMETATARSAL AMPUTATION;  Surgeon: Newt Minion, MD;  Location: [facility];  Service: Orthopedics;  Laterality: Right;  . AV FISTULA PLACEMENT Left 04/21/2016   Procedure: INSERTION OF ARTERIOVENOUS (AV) GORE-TEX GRAFT ARM LEFT;  Surgeon: Elam Dutch, MD;  Location: [facility];  Service: Vascular;  Laterality: Left;  . [place] TRANSPOSITION Left 07/10/2014   Procedure: BASCILIC VEIN TRANSPOSITION;  Surgeon: Angelia Mould, MD;  Location: [facility];  Service: Vascular;  Laterality: Left;  . BASCILIC VEIN TRANSPOSITION Right 11/08/2014   Procedure: FIRST STAGE BASILIC VEIN TRANSPOSITION;  Surgeon: Angelia Mould, MD;  Location: [facility];  Service: Vascular;  Laterality: Right;  . BASCILIC VEIN TRANSPOSITION Right 01/18/2015   Procedure: SECOND STAGE BASILIC VEIN TRANSPOSITION;  Surgeon: Angelia Mould, MD;  Location: [facility];  Service: Vascular;  Laterality: Right;  . CRANIOTOMY N/A 08/23/2017   Procedure: CRANIOTOMY HEMATOMA EVACUATION SUBDURAL;  Surgeon: Ashok Pall, MD;  Location: [facility];  Service: Neurosurgery;  Laterality: N/A;  . CRANIOTOMY Left 08/24/2017   Procedure: CRANIOTOMY FOR RECURRENT ACUTE SUBDURAL HEMATOMA;  Surgeon: Ashok Pall, MD;  Location: [facility];  Service: Neurosurgery;  Laterality: Left;  .  ESOPHAGOGASTRODUODENOSCOPY N/A 03/31/2013   Procedure: ESOPHAGOGASTRODUODENOSCOPY (EGD);  Surgeon: Gatha Mayer, MD;  Location[facility];  Service: Endoscopy;  Laterality: N/A;  . EYE SURGERY     laser surgery  . FEMORAL-POPLITEAL BYPASS GRAFT Left 07/08/2016   Procedure: LEFT  FEMORAL-BELOW KNEE POPLITEAL ARTERY BYPASS GRAFT USING 6MM X 80 CM PROPATEN GORETEX GRAFT WITH RINGS.;  Surgeon: Rosetta Posner, MD;  Location: [facility];  Service: Vascular;  Laterality: Left;  . FEMORAL-POPLITEAL BYPASS GRAFT Right 08/11/2017   Procedure: RIGHT FEMORAL TO BELOW KNEE POPLITEAL ARTERKY  BYPASS GRAFT USING 6MM RINGED PROPATEN GRAFT;  Surgeon: Rosetta Posner, MD;  Location: [facility];  Service: Vascular;  Laterality: Right;  . FISTULOGRAM Left 10/29/2014   Procedure: FISTULOGRAM;  Surgeon: Angelia Mould, MD;  Location: [facility];  Service: Cardiovascular;  Laterality: Left;  Marland Kitchen GAS/FLUID EXCHANGE Left 12/20/2018   Procedure: AIR GAS EXCHANGE;  Surgeon: Jalene Mullet, MD;  Location: [facility];  Service: Ophthalmology;  Laterality: Left;  . KNEE ARTHROSCOPY Right 05/06/2018   Procedure: RIGHT KNEE ARTHROSCOPY;  Surgeon: Newt Minion, MD;  Location: [facility];  Service: Orthopedics;  Laterality: Right;  . KNEE ARTHROSCOPY Right 06/10/2018   Procedure: RIGHT KNEE ARTHROSCOPY AND DEBRIDEMENT;  Surgeon: Newt Minion, MD;  Location: [facility];  Service: Orthopedics;  Laterality: Right;  . LIGATION OF ARTERIOVENOUS  FISTULA Left 04/21/2016   Procedure: LIGATION OF ARTERIOVENOUS  FISTULA LEFT ARM;  Surgeon: Elam Dutch, MD;  Location: [facility];  Service: Vascular;  Laterality: Left;  . LOWER EXTREMITY ANGIOGRAPHY N/A 04/06/2017   Procedure: Lower Extremity Angiography - Right;  Surgeon: Serafina Mitchell, MD;  Location: [facility];  Service: Cardiovascular;  Laterality: N/A;  . MEMBRANE PEEL Left 12/20/2018   Procedure: MEMBRANE PEEL;  Surgeon: Jalene Mullet, MD;  Location: [facility];  Service: Ophthalmology;   Laterality: Left;  . PATCH ANGIOPLASTY Right 01/18/2015   Procedure: BASILIC VEIN PATCH ANGIOPLASTY USING VASCUGUARD PATCH;  Surgeon: Angelia Mould, MD;  Location: [facility];  Service: Vascular;  Laterality: Right;  . PERIPHERAL VASCULAR BALLOON ANGIOPLASTY  09/22/2016   Procedure: Peripheral Vascular Balloon Angioplasty;  Surgeon:  Serafina Mitchell, MD;  Location: [facility];  Service: Cardiovascular;;  Lt. Fistula  . PERIPHERAL VASCULAR BALLOON ANGIOPLASTY Left 03/22/2018   Procedure: PERIPHERAL VASCULAR BALLOON ANGIOPLASTY;  Surgeon: Serafina Mitchell, MD;  Location: [facility];  Service: Cardiovascular;  Laterality: Left;  Arm shunt  . PERIPHERAL VASCULAR CATHETERIZATION N/A 06/23/2016   Procedure: Abdominal Aortogram w/Lower Extremity;  Surgeon: Serafina Mitchell, MD;  Location: [facility];  Service: Cardiovascular;  Laterality: N/A;  . PERIPHERAL VASCULAR CATHETERIZATION  06/23/2016   Procedure: Peripheral Vascular Intervention;  Surgeon: Serafina Mitchell, MD;  Location: [facility];  Service: Cardiovascular;;  lt common and external illiac artery  . PHOTOCOAGULATION WITH LASER Left 12/20/2018   Procedure: PHOTOCOAGULATION WITH LASER;  Surgeon: Jalene Mullet, MD;  Location: [facility];  Service: Ophthalmology;  Laterality: Left;  . 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   Procedure: REPAIR OF COMPLEX TRACTION RETINAL DETACHMENT;  Surgeon: Jalene Mullet, MD;  Location: [facility];  Service: Ophthalmology;  Laterality: Left;    FAMILY HISTORY Family History  Problem Relation Age of Onset  . Other Mother        not sure of cause of death  . Diabetes Father   . Pancreatic cancer Maternal Grandmother   . Diabetes Sister   . Diabetes Sister   . Colon cancer Neg Hx     SOCIAL HISTORY Social History   Tobacco Use  . Smoking status: Former Smoker    Packs/day: 0.35    Years: 40.00    Pack years: 14.00    Types: Cigarettes    Quit date:  05/10/2012    Years since quitting: 8.1  . Smokeless tobacco: Never Used  Vaping Use  . Vaping Use: Never used  Substance Use Topics  . Alcohol use: No  . Drug use: No    Comment: marijuana; quit in early 1980's         OPHTHALMIC EXAM: Base Eye Exam    Visual Acuity (ETDRS)      Right Left   Dist [place] 20/50 -2 20/50   Dist ph [place] 20/50 +1 NI       Tonometry (Tonopen, 2:25 PM)      Right Left   Pressure 17 18       Pupils      Pupils Dark Light Shape React APD   Right PERRL 3 3 Round Brisk None   Left PERRL 3 3 Round Brisk None       Visual Fields (Counting fingers)      Left Right    Full Full       Extraocular Movement      Right Left    Full Full       Neuro/Psych    Oriented x3: Yes   Mood/Affect: Normal       Dilation    Right eye: 1.0% Mydriacyl, 2.5% Phenylephrine @ 2:25 PM        Slit Lamp and Fundus Exam    External Exam      Right Left   External Normal        Slit Lamp Exam      Right Left   Lids/Lashes Normal    Conjunctiva/Sclera White and quiet    Cornea Clear    Anterior Chamber Deep and quiet    Iris Round and reactive    Lens Nuclear sclerosis 3+, 2+ Cortical cataract    Anterior Vitreous Normal  Fundus Exam      Right Left   Posterior Vitreous Pre-retinal hemorrhage    Disc Neovascularization    C/D Ratio 0.5    Macula Microaneurysms    Vessels PDR-active    Periphery  moderate scattter           IMAGING AND PROCEDURES  Imaging and Procedures for 07/04/20  Panretinal Photocoagulation - OD - Right Eye       Time Out Confirmed correct patient, procedure, site, and patient consented.   Anesthesia Topical anesthesia was used.   Laser Information The type of laser was diode. Color was yellow. The duration in seconds was 0.02. The spot size was 390 microns. Laser power was 340. Total spots was 1042.   Notes PRP applied inferotemporal temporal superiorly posteriorly and particularly around the  neovascularization elsewhere along the inferotemporal arcade                ASSESSMENT/PLAN:  No problem-specific Assessment & Plan notes found for this encounter.      ICD-10-CM   1. 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  PRP OD today, in order to prevent progression with upcoming planned cataract extraction intraocular lens placement right eye.  2.  Dilate OU next in order to  monitor response to PRP OD as well as findings of the macula left eye  3.  Ophthalmic Meds Ordered this visit:  No orders of the defined types were placed in this encounter.      Return in about 7 weeks (around 08/22/2020) for DILATE OU, COLOR FP, OCT.  There are no Patient Instructions on file for this visit.   Explained the diagnoses, plan, and follow up with the patient and they expressed understanding.  Patient expressed understanding of the importance of proper follow up care.   Clent Demark Louretta Tantillo M.D. Diseases & Surgery of the Retina and Vitreous Retina & Diabetic [place] 07/04/20     Abbreviations: M myopia (nearsighted); A astigmatism; H hyperopia (farsighted); P presbyopia; Mrx spectacle prescription;  CTL contact lenses; OD right eye; OS left eye; OU both eyes  XT exotropia; ET esotropia; PEK punctate epithelial keratitis; PEE punctate epithelial erosions; DES dry eye syndrome; MGD meibomian gland dysfunction; ATs artificial tears; PFAT's preservative free artificial tears; [place] nuclear sclerotic cataract; PSC posterior subcapsular cataract; ERM epi-retinal membrane; PVD posterior vitreous detachment; RD retinal detachment; DM diabetes mellitus; DR diabetic retinopathy; NPDR non-proliferative diabetic retinopathy; PDR proliferative diabetic retinopathy; CSME clinically  significant macular edema; DME diabetic macular edema; dbh dot blot hemorrhages; CWS cotton wool spot; POAG primary open angle glaucoma; C/D cup-to-disc ratio; HVF humphrey visual field; GVF goldmann visual field; OCT optical coherence tomography; IOP intraocular pressure; BRVO Branch retinal vein occlusion; CRVO central retinal vein occlusion; CRAO central retinal artery occlusion; BRAO branch retinal artery occlusion; RT retinal tear; SB scleral buckle; PPV pars plana vitrectomy; VH Vitreous hemorrhage; PRP panretinal laser photocoagulation; IVK intravitreal kenalog; VMT vitreomacular traction; MH Macular hole;  NVD neovascularization of the disc; NVE neovascularization elsewhere; AREDS age related eye disease study; ARMD age related macular degeneration; POAG primary open angle glaucoma; EBMD epithelial/anterior basement membrane dystrophy; ACIOL anterior chamber intraocular lens; IOL intraocular lens; PCIOL posterior chamber intraocular lens; Phaco/IOL phacoemulsification with intraocular lens placement; PRK photorefractive keratectomy; LASIK laser assisted  in situ keratomileusis; HTN hypertension; DM diabetes mellitus; COPD chronic obstructive pulmonary disease

## 2020-07-04 NOTE — Assessment & Plan Note (Signed)
Active complete PRP today in anticipation of planned cataract extraction with intraocular lens placement right eye

## 2020-07-04 NOTE — Assessment & Plan Note (Signed)
Status post recent injection Avastin

## 2020-07-05 DIAGNOSIS — D509 Iron deficiency anemia, unspecified: Secondary | ICD-10-CM | POA: Diagnosis not present

## 2020-07-05 DIAGNOSIS — N186 End stage renal disease: Secondary | ICD-10-CM | POA: Diagnosis not present

## 2020-07-05 DIAGNOSIS — E876 Hypokalemia: Secondary | ICD-10-CM | POA: Diagnosis not present

## 2020-07-05 DIAGNOSIS — Z992 Dependence on renal dialysis: Secondary | ICD-10-CM | POA: Diagnosis not present

## 2020-07-05 DIAGNOSIS — N2581 Secondary hyperparathyroidism of renal origin: Secondary | ICD-10-CM | POA: Diagnosis not present

## 2020-07-05 DIAGNOSIS — T782XXA Anaphylactic shock, unspecified, initial encounter: Secondary | ICD-10-CM | POA: Diagnosis not present

## 2020-07-08 DIAGNOSIS — E876 Hypokalemia: Secondary | ICD-10-CM | POA: Diagnosis not present

## 2020-07-08 DIAGNOSIS — N2581 Secondary hyperparathyroidism of renal origin: Secondary | ICD-10-CM | POA: Diagnosis not present

## 2020-07-08 DIAGNOSIS — N186 End stage renal disease: Secondary | ICD-10-CM | POA: Diagnosis not present

## 2020-07-08 DIAGNOSIS — T782XXA Anaphylactic shock, unspecified, initial encounter: Secondary | ICD-10-CM | POA: Diagnosis not present

## 2020-07-08 DIAGNOSIS — D509 Iron deficiency anemia, unspecified: Secondary | ICD-10-CM | POA: Diagnosis not present

## 2020-07-08 DIAGNOSIS — Z992 Dependence on renal dialysis: Secondary | ICD-10-CM | POA: Diagnosis not present

## 2020-07-09 ENCOUNTER — Ambulatory Visit (INDEPENDENT_AMBULATORY_CARE_PROVIDER_SITE_OTHER): Payer: Medicare Other | Admitting: *Deleted

## 2020-07-09 DIAGNOSIS — J309 Allergic rhinitis, unspecified: Secondary | ICD-10-CM | POA: Diagnosis not present

## 2020-07-10 DIAGNOSIS — Z992 Dependence on renal dialysis: Secondary | ICD-10-CM | POA: Diagnosis not present

## 2020-07-10 DIAGNOSIS — N186 End stage renal disease: Secondary | ICD-10-CM | POA: Diagnosis not present

## 2020-07-10 DIAGNOSIS — E876 Hypokalemia: Secondary | ICD-10-CM | POA: Diagnosis not present

## 2020-07-10 DIAGNOSIS — T782XXA Anaphylactic shock, unspecified, initial encounter: Secondary | ICD-10-CM | POA: Diagnosis not present

## 2020-07-10 DIAGNOSIS — N2581 Secondary hyperparathyroidism of renal origin: Secondary | ICD-10-CM | POA: Diagnosis not present

## 2020-07-10 DIAGNOSIS — D509 Iron deficiency anemia, unspecified: Secondary | ICD-10-CM | POA: Diagnosis not present

## 2020-07-11 ENCOUNTER — Encounter (INDEPENDENT_AMBULATORY_CARE_PROVIDER_SITE_OTHER): Payer: Medicare Other | Admitting: Ophthalmology

## 2020-07-11 ENCOUNTER — Other Ambulatory Visit: Payer: Self-pay | Admitting: *Deleted

## 2020-07-11 NOTE — Patient Instructions (Signed)
Goals Addressed              This Visit's Progress     Patient Stated   .  COMPLETED: Patient will be able to manage/maintain her care of her HTN, CKD and CHF at home (pt-stated)        Destiny Day (see longitudinal plan of care for additional care plan information)  Objective:  . Last practice recorded BP readings:  BP Readings from Last 3 Encounters:  02/06/20 (!) 157/74  02/04/20 (!) 169/49  01/09/20 124/82 .   Marland Kitchen Most recent eGFR/CrCl: No results found for: EGFR  No components found for: CRCL  Current Barriers:  Marland Kitchen Knowledge deficit related to self care management of hypertension . Knowledge deficit related to dangers of uncontrolled hypertension  Case Manager Clinical Goal(s):  Marland Kitchen Over the next 90 days, patient will verbalize basic understanding of hypertension disease process and self health management plan as evidenced by reported bp values within normal limits during HD/at home  . over the next 90 days patient will verbalize maintanence of CKD, CHF and HTN during follow up contact  Interventions:  . Evaluation of current treatment plan related to hypertension self management and patient's adherence to plan as established by provider. . Reviewed medications with patient and discussed importance of compliance . Discussed plans with patient for ongoing care management follow up and provided patient with direct contact information for care management team . Provided education regarding s/s hypertensive crisis . Discussed signs and symptoms of hypertension . Will send patient a smaller size B/P cuff for home monitoring  Patient Self Care Activities:  . Self administers medications as prescribed . Attends all scheduled provider appointments . Calls provider office for new concerns, questions, or BP outside discussed parameters . Monitors BP and records as discussed . Adheres to a low sodium diet/DASH diet . Increase physical activity as tolerated . Verbalize where to  go to receive medical care . Maintain a good phosphorus level per HD records . Call MD for new concerns with increased symptoms per CHF action plan (increased coughing and wt gain not related to allergies)  Please see past updates related to this goal by clicking on the "Past Updates" button in the selected goal   Updated 05/30/20 Completed due to duplicate goals 72/62/03       Other   .  Track and Manage Activity and Exertion   On track     Timeframe:  Long-Range Goal Priority:  High Start Date:  05/30/20                           Expected End Date: 01/23/21                      Follow Up Date 10/23/20    - follow activity or exercise plan - make an activity or exercise plan    Why is this important?   Exercising is very important when managing your heart failure.  It will help your heart get stronger.    Notes: Patient wants to stay as active and independent as possible. She wants to  maintain her strength and mobility. Patient received exercise printouts and states that she runs many errands and has a lot of provider appointments on her off days of dialysis which has increased the amount she walks.     .  Track and Manage Fluids and Swelling   On track  Timeframe:  Long-Range Goal Priority:  High Start Date:  05/30/20                          Expected End Date: 01/23/21                      Follow Up Date 10/23/20   - call office if I gain more than 2 pounds in one day or 5 pounds in one week - keep legs up while sitting - track weight in diary - use salt in moderation - watch for swelling in feet, ankles and legs every day    Why is this important?   It is important to check your weight daily and watch how much salt and liquids you have.  It will help you to manage your heart failure.    Notes: Patient received [facility] CHF packet that has CHF zones and actions plans. Patient weighs 3 times weekly at dialysis, she is unable to balance on home scales due to bilateral partial  foot amputations. Dialysis does provide her with a printout of her recorded weights and B/P    .  Track and Manage Symptoms   On track     Timeframe:  Long-Range Goal Priority:  High Start Date: 05/30/20                            Expected End Date: 01/23/21                      Follow Up Date 10/23/20   - develop a rescue plan - follow rescue plan if symptoms flare-up - know when to call the doctor - track symptoms and what helps feel better or worse - dress right for the weather, hot or cold    Why is this important?   You will be able to handle your symptoms better if you keep track of them.  Making some simple changes to your lifestyle will help.  Eating healthy is one thing you can do to take good care of yourself.    Notes: Patient received  B/P cuff but states it is too big and she has difficulty using the device. Nurse will check to see if [facility] has a smaller B/P cuff. Patient gets weighed and has her B/P taken 3 times weekly at dialysis where it is recorded and given to her on a printout.  Patient states she received CHF education with zones and action plans.  Updated: 07/11/20

## 2020-07-11 NOTE — Patient Outreach (Signed)
[facility]) Care Management  [place]  07/11/2020   Destiny Day 04/19/1951 [id number]  Subjective: Successful telephone outreach call to patient. HIPAA identifiers obtained. Patient states she is doing well. She reports that she did receive the CHF education packet with B/P cuff this nurse sent previously. Patient states that the B/P is too big to use; nurse will see if [facility] has a smaller cuff size to send the patient. Patient states that her CHF is currently under control. She continues to receive dialysis 3 times weekly where she is weighed and her B/P is taken recorded and provider to her on a printout sheet. Patient is unable to weigh herself at home due to bilateral foot amputations which prevents her from balancing safely on her scale. Patient denies having any recent falls and states that her home environment is safe. Patient did not have any further questions or concerns today and did confirm that she has this nurse's contact number to call her if needed.   Encounter Medications:  Outpatient Encounter Medications as of 07/11/2020  Medication Sig Note  . albuterol (PROAIR HFA) 108 (90 Base) MCG/ACT inhaler 4 puffs every 4-6 hours as needed   . amLODipine (NORVASC) 10 MG tablet Take 10 mg by mouth at bedtime.   Marland Kitchen aspirin EC 81 MG tablet Take 81 mg by mouth daily.   Marland Kitchen atorvastatin (LIPITOR) 40 MG tablet Take 1 tablet (40 mg total) by mouth daily.   Marland Kitchen azelastine (ASTELIN) 0.1 % nasal spray Place 1 spray into both nostrils 2 (two) times daily.   . benzonatate (TESSALON PERLES) 100 MG capsule Take 1 capsule (100 mg total) by mouth 3 (three) times daily as needed for cough.   Marland Kitchen BIDIL 20-37.5 MG tablet TAKE ONE TABLET BY MOUTH THREE TIMES A DAY    . budesonide-formoterol (SYMBICORT) 160-4.5 MCG/ACT inhaler 2 puffs twice daily with spacer 05/30/2020: Can't afford  . busPIRone (BUSPAR) 7.5 MG tablet 7.5 mg.    . carvedilol (COREG) 12.5 MG tablet Take 12.5 mg by  mouth See admin instructions. Take 1 tablet (12.5 mg) by mouth once daily in the evening on Mondays, Wednesdays, & Fridays. (Dialysis) Take 1 tablet (12.5 mg) by mouth twice daily on Sundays, Tuesdays, Thursdays, & Saturdays. (Non Dialysis)   . cinacalcet (SENSIPAR) 60 MG tablet Take 90 mg by mouth See admin instructions. Take 1 tablet (90 mg) by mouth in the evening on Mondays, Wednesdays, & Fridays (dialysis) Take 1 tablet (90 mg) by mouth twice daily on Sundays, Tuesdays, Thursdays, Saturdays. (non dialysis)   . diazepam (VALIUM) 5 MG tablet Take 5 mg by mouth 2 (two) times daily as needed for anxiety.   . docusate sodium (COLACE) 100 MG capsule Take 1 capsule (100 mg total) by mouth 2 (two) times daily.   Marland Kitchen EPINEPHrine 0.3 mg/0.3 mL IJ SOAJ injection Inject 0.3 mLs (0.3 mg total) into the muscle as needed for anaphylaxis. 05/30/2020: Can't afford  . ferric citrate (AURYXIA) 1 GM 210 MG(Fe) tablet Take 210-420 mg by mouth See admin instructions. Take 2 tablets (420 mg) by mouth with each meal & take 1 tablet (210 mg) by mouth with snacks.   . fluticasone (FLONASE) 50 MCG/ACT nasal spray Place 2 sprays into both nostrils daily as needed for allergies or rhinitis.   Marland Kitchen gabapentin (NEURONTIN) 100 MG capsule TAKE 1 CAPSULE (100 MG TOTAL) BY MOUTH AT BEDTIME. WHEN NECESSARY FOR NEUROPATHY PAIN   . glipiZIDE (GLUCOTROL) 5 MG tablet Take 5  mg by mouth daily before breakfast.   . guaiFENesin-codeine (ROBITUSSIN AC) 100-10 MG/5ML syrup Take by mouth.   . lidocaine-prilocaine (EMLA) cream Apply 1 application topically 3 (three) times a week. 1-2 hours prior to dialysis   . LOPERAMIDE HCL PO Take by mouth.   . montelukast (SINGULAIR) 10 MG tablet Take 1 tablet (10 mg total) by mouth at bedtime.   . multivitamin (RENA-VIT) TABS tablet Take 1 tablet by mouth daily.    Marland Kitchen omeprazole (PRILOSEC) 20 MG capsule Take 20 mg by mouth daily.    . ondansetron (ZOFRAN) 4 MG tablet Take 1 tablet (4 mg total) by mouth every  6 (six) hours as needed for nausea.   . polyethylene glycol (MIRALAX / GLYCOLAX) 17 g packet Take 17 g by mouth daily.   . traZODone (DESYREL) 50 MG tablet    . Vitamin D, Ergocalciferol, (DRISDOL) 1.25 MG (50000 UT) CAPS capsule Take 50,000 Units by mouth every Sunday. Takes on Sunday   . XYZAL ALLERGY 24HR 5 MG tablet Take 5 mg by mouth daily.    No facility-administered encounter medications on file as of 07/11/2020.    Functional Status:  In your present state of health, do you have any difficulty performing the following activities: 07/18/2019  Hearing? N  Vision? N  Difficulty concentrating or making decisions? N  Walking or climbing stairs? Y  Dressing or bathing? N  Doing errands, shopping? N  Preparing Food and eating ? N  Using the Toilet? N  In the past six months, have you accidently leaked urine? N  Do you have problems with loss of bowel control? N  Managing your Medications? N  Managing your Finances? N  Housekeeping or managing your Housekeeping? N  Some recent data might be hidden    Fall/Depression Screening: Fall Risk  07/11/2020 05/30/2020 05/30/2020  Falls in the past year? 0 0 0  Number falls in past yr: 0 0 0  Injury with Fall? 0 0 0  Risk for fall due to : - Impaired mobility;Impaired balance/gait Impaired mobility;Impaired balance/gait;Impaired vision  Follow up - Falls prevention discussed;Education provided;Falls evaluation completed Falls prevention discussed;Education provided;Falls evaluation completed   PHQ 2/9 Scores 05/30/2020 03/28/2020 11/15/2019 08/15/2019 07/18/2019 06/20/2019 06/06/2019  PHQ - 2 Score 0 0 0 0 0 0 0    Assessment:  Goals Addressed              This Visit's Progress     Patient Stated   .  COMPLETED: Patient will be able to manage/maintain her care of her HTN, CKD and CHF at home (pt-stated)        [place] (see longitudinal plan of care for additional care plan information)  Objective:  . Last practice  recorded BP readings:  BP Readings from Last 3 Encounters:  02/06/20 (!) 157/74  02/04/20 (!) 169/49  01/09/20 124/82 .   Marland Kitchen Most recent eGFR/CrCl: No results found for: EGFR  No components found for: CRCL  Current Barriers:  Marland Kitchen Knowledge deficit related to self care management of hypertension . Knowledge deficit related to dangers of uncontrolled hypertension  Case Manager Clinical Goal(s):  Marland Kitchen Over the next 90 days, patient will verbalize basic understanding of hypertension disease process and self health management plan as evidenced by reported bp values within normal limits during HD/at home  . over the next 90 days patient will verbalize maintanence of CKD, CHF and HTN during follow up contact  Interventions:  . Evaluation of  current treatment plan related to hypertension self management and patient's adherence to plan as established by provider. . Reviewed medications with patient and discussed importance of compliance . Discussed plans with patient for ongoing care management follow up and provided patient with direct contact information for care management team . Provided education regarding s/s hypertensive crisis . Discussed signs and symptoms of hypertension . Will send patient a smaller size B/P cuff for home monitoring  Patient Self Care Activities:  . Self administers medications as prescribed . Attends all scheduled provider appointments . Calls provider office for new concerns, questions, or BP outside discussed parameters . Monitors BP and records as discussed . Adheres to a low sodium diet/DASH diet . Increase physical activity as tolerated . Verbalize where to go to receive medical care . Maintain a good phosphorus level per HD records . Call MD for new concerns with increased symptoms per CHF action plan (increased coughing and wt gain not related to allergies)  Please see past updates related to this goal by clicking on the "Past Updates" button in the selected  goal   Updated 05/30/20 Completed due to duplicate goals 60/10/93       Other   .  Track and Manage Activity and Exertion   On track     Timeframe:  Long-Range Goal Priority:  High Start Date:  05/30/20                           Expected End Date: 01/23/21                      Follow Up Date 10/23/20    - follow activity or exercise plan - make an activity or exercise plan    Why is this important?   Exercising is very important when managing your heart failure.  It will help your heart get stronger.    Notes: Patient wants to stay as active and independent as possible. She wants to  maintain her strength and mobility. Patient received exercise printouts and states that she runs many errands and has a lot of provider appointments on her off days of dialysis which has increased the amount she walks.     .  Track and Manage Fluids and Swelling   On track     Timeframe:  Long-Range Goal Priority:  High Start Date:  05/30/20                          Expected End Date: 01/23/21                      Follow Up Date 10/23/20   - call office if I gain more than 2 pounds in one day or 5 pounds in one week - keep legs up while sitting - track weight in diary - use salt in moderation - watch for swelling in feet, ankles and legs every day    Why is this important?   It is important to check your weight daily and watch how much salt and liquids you have.  It will help you to manage your heart failure.    Notes: Patient received [facility] CHF packet that has CHF zones and actions plans. 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 Dialysis does provide her with a printout of her  recorded weights and B/P    .  Track and Manage Symptoms   On track     Timeframe:  Long-Range Goal Priority:  High Start Date: 05/30/20                            Expected End Date: 01/23/21                      Follow Up Date 10/23/20   -  develop a rescue plan - follow rescue plan if symptoms flare-up - know when to call the doctor - track symptoms and what helps feel better or worse - dress right for the weather, hot or cold    Why is this important?   You will be able to handle your symptoms better if you keep track of them.  Making some simple changes to your lifestyle will help.  Eating healthy is one thing you can do to take good care of yourself.    Notes: Patient received  B/P cuff but states it is too big and she has difficulty using the device. Nurse will check to see if [facility] has a smaller B/P cuff. Patient gets weighed and has her B/P taken 3 times weekly at dialysis where it is recorded and given to her on a printout.  Patient states she received CHF education with zones and action plans.  Updated: 07/11/20       Plan: RN Health Coach will send patient a smaller B/P cuff and will call patient within the month of March. Follow-up:  Patient agrees to Care Plan and Follow-up.  Emelia Loron RN, BSN [place] [phone] [email or web address]

## 2020-07-12 DIAGNOSIS — D509 Iron deficiency anemia, unspecified: Secondary | ICD-10-CM | POA: Diagnosis not present

## 2020-07-12 DIAGNOSIS — N186 End stage renal disease: Secondary | ICD-10-CM | POA: Diagnosis not present

## 2020-07-12 DIAGNOSIS — T782XXA Anaphylactic shock, unspecified, initial encounter: Secondary | ICD-10-CM | POA: Diagnosis not present

## 2020-07-12 DIAGNOSIS — Z992 Dependence on renal dialysis: Secondary | ICD-10-CM | POA: Diagnosis not present

## 2020-07-12 DIAGNOSIS — N2581 Secondary hyperparathyroidism of renal origin: Secondary | ICD-10-CM | POA: Diagnosis not present

## 2020-07-12 DIAGNOSIS — E876 Hypokalemia: Secondary | ICD-10-CM | POA: Diagnosis not present

## 2020-07-15 DIAGNOSIS — N186 End stage renal disease: Secondary | ICD-10-CM | POA: Diagnosis not present

## 2020-07-15 DIAGNOSIS — N2581 Secondary hyperparathyroidism of renal origin: Secondary | ICD-10-CM | POA: Diagnosis not present

## 2020-07-15 DIAGNOSIS — T782XXA Anaphylactic shock, unspecified, initial encounter: Secondary | ICD-10-CM | POA: Diagnosis not present

## 2020-07-15 DIAGNOSIS — Z992 Dependence on renal dialysis: Secondary | ICD-10-CM | POA: Diagnosis not present

## 2020-07-15 DIAGNOSIS — D509 Iron deficiency anemia, unspecified: Secondary | ICD-10-CM | POA: Diagnosis not present

## 2020-07-15 DIAGNOSIS — E876 Hypokalemia: Secondary | ICD-10-CM | POA: Diagnosis not present

## 2020-07-16 ENCOUNTER — Ambulatory Visit (INDEPENDENT_AMBULATORY_CARE_PROVIDER_SITE_OTHER): Payer: Medicare Other | Admitting: *Deleted

## 2020-07-16 DIAGNOSIS — J309 Allergic rhinitis, unspecified: Secondary | ICD-10-CM

## 2020-07-17 DIAGNOSIS — E876 Hypokalemia: Secondary | ICD-10-CM | POA: Diagnosis not present

## 2020-07-17 DIAGNOSIS — D509 Iron deficiency anemia, unspecified: Secondary | ICD-10-CM | POA: Diagnosis not present

## 2020-07-17 DIAGNOSIS — Z992 Dependence on renal dialysis: Secondary | ICD-10-CM | POA: Diagnosis not present

## 2020-07-17 DIAGNOSIS — N2581 Secondary hyperparathyroidism of renal origin: Secondary | ICD-10-CM | POA: Diagnosis not present

## 2020-07-17 DIAGNOSIS — N186 End stage renal disease: Secondary | ICD-10-CM | POA: Diagnosis not present

## 2020-07-17 DIAGNOSIS — T782XXA Anaphylactic shock, unspecified, initial encounter: Secondary | ICD-10-CM | POA: Diagnosis not present

## 2020-07-19 DIAGNOSIS — N2581 Secondary hyperparathyroidism of renal origin: Secondary | ICD-10-CM | POA: Diagnosis not present

## 2020-07-19 DIAGNOSIS — T782XXA Anaphylactic shock, unspecified, initial encounter: Secondary | ICD-10-CM | POA: Diagnosis not present

## 2020-07-19 DIAGNOSIS — N186 End stage renal disease: Secondary | ICD-10-CM | POA: Diagnosis not present

## 2020-07-19 DIAGNOSIS — Z992 Dependence on renal dialysis: Secondary | ICD-10-CM | POA: Diagnosis not present

## 2020-07-19 DIAGNOSIS — D509 Iron deficiency anemia, unspecified: Secondary | ICD-10-CM | POA: Diagnosis not present

## 2020-07-19 DIAGNOSIS — E876 Hypokalemia: Secondary | ICD-10-CM | POA: Diagnosis not present

## 2020-07-22 DIAGNOSIS — E876 Hypokalemia: Secondary | ICD-10-CM | POA: Diagnosis not present

## 2020-07-22 DIAGNOSIS — N186 End stage renal disease: Secondary | ICD-10-CM | POA: Diagnosis not present

## 2020-07-22 DIAGNOSIS — D509 Iron deficiency anemia, unspecified: Secondary | ICD-10-CM | POA: Diagnosis not present

## 2020-07-22 DIAGNOSIS — Z992 Dependence on renal dialysis: Secondary | ICD-10-CM | POA: Diagnosis not present

## 2020-07-22 DIAGNOSIS — T782XXA Anaphylactic shock, unspecified, initial encounter: Secondary | ICD-10-CM | POA: Diagnosis not present

## 2020-07-22 DIAGNOSIS — N2581 Secondary hyperparathyroidism of renal origin: Secondary | ICD-10-CM | POA: Diagnosis not present

## 2020-07-23 ENCOUNTER — Ambulatory Visit (INDEPENDENT_AMBULATORY_CARE_PROVIDER_SITE_OTHER): Payer: Medicare Other | Admitting: *Deleted

## 2020-07-23 DIAGNOSIS — J309 Allergic rhinitis, unspecified: Secondary | ICD-10-CM | POA: Diagnosis not present

## 2020-07-24 DIAGNOSIS — E876 Hypokalemia: Secondary | ICD-10-CM | POA: Diagnosis not present

## 2020-07-24 DIAGNOSIS — D509 Iron deficiency anemia, unspecified: Secondary | ICD-10-CM | POA: Diagnosis not present

## 2020-07-24 DIAGNOSIS — T782XXA Anaphylactic shock, unspecified, initial encounter: Secondary | ICD-10-CM | POA: Diagnosis not present

## 2020-07-24 DIAGNOSIS — Z992 Dependence on renal dialysis: Secondary | ICD-10-CM | POA: Diagnosis not present

## 2020-07-24 DIAGNOSIS — N186 End stage renal disease: Secondary | ICD-10-CM | POA: Diagnosis not present

## 2020-07-24 DIAGNOSIS — N2581 Secondary hyperparathyroidism of renal origin: Secondary | ICD-10-CM | POA: Diagnosis not present

## 2020-07-26 DIAGNOSIS — D509 Iron deficiency anemia, unspecified: Secondary | ICD-10-CM | POA: Diagnosis not present

## 2020-07-26 DIAGNOSIS — T782XXA Anaphylactic shock, unspecified, initial encounter: Secondary | ICD-10-CM | POA: Diagnosis not present

## 2020-07-26 DIAGNOSIS — N186 End stage renal disease: Secondary | ICD-10-CM | POA: Diagnosis not present

## 2020-07-26 DIAGNOSIS — E876 Hypokalemia: Secondary | ICD-10-CM | POA: Diagnosis not present

## 2020-07-26 DIAGNOSIS — Z992 Dependence on renal dialysis: Secondary | ICD-10-CM | POA: Diagnosis not present

## 2020-07-26 DIAGNOSIS — N2581 Secondary hyperparathyroidism of renal origin: Secondary | ICD-10-CM | POA: Diagnosis not present

## 2020-07-27 DIAGNOSIS — I159 Secondary hypertension, unspecified: Secondary | ICD-10-CM | POA: Diagnosis not present

## 2020-07-27 DIAGNOSIS — Z992 Dependence on renal dialysis: Secondary | ICD-10-CM | POA: Diagnosis not present

## 2020-07-27 DIAGNOSIS — N186 End stage renal disease: Secondary | ICD-10-CM | POA: Diagnosis not present

## 2020-07-29 DIAGNOSIS — N186 End stage renal disease: Secondary | ICD-10-CM | POA: Diagnosis not present

## 2020-07-29 DIAGNOSIS — Z992 Dependence on renal dialysis: Secondary | ICD-10-CM | POA: Diagnosis not present

## 2020-07-29 DIAGNOSIS — N2581 Secondary hyperparathyroidism of renal origin: Secondary | ICD-10-CM | POA: Diagnosis not present

## 2020-07-30 ENCOUNTER — Ambulatory Visit (INDEPENDENT_AMBULATORY_CARE_PROVIDER_SITE_OTHER): Payer: Medicare Other

## 2020-07-30 DIAGNOSIS — J309 Allergic rhinitis, unspecified: Secondary | ICD-10-CM

## 2020-07-31 DIAGNOSIS — Z992 Dependence on renal dialysis: Secondary | ICD-10-CM | POA: Diagnosis not present

## 2020-07-31 DIAGNOSIS — N2581 Secondary hyperparathyroidism of renal origin: Secondary | ICD-10-CM | POA: Diagnosis not present

## 2020-07-31 DIAGNOSIS — E1122 Type 2 diabetes mellitus with diabetic chronic kidney disease: Secondary | ICD-10-CM | POA: Diagnosis not present

## 2020-07-31 DIAGNOSIS — N186 End stage renal disease: Secondary | ICD-10-CM | POA: Diagnosis not present

## 2020-08-01 ENCOUNTER — Telehealth: Payer: Self-pay | Admitting: Internal Medicine

## 2020-08-01 NOTE — Telephone Encounter (Signed)
Patient reports that these symptoms are not new, have been going on for some time. Apt made with Dr. Shearon Stalls for 08/13/20 at 9:15am. Declined virtual visit. She has been vaccinated for covid x3.

## 2020-08-01 NOTE — Telephone Encounter (Signed)
Primary Pulmonologist: Shearon Stalls Last office visit and with whom: 06/13/2019 What do we see them for (pulmonary problems): Asthma, COPD, hx Covid infection,  Last OV assessment/plan:  Assessment:  Destiny Day is a 70 year old woman with history of end-stage renal disease who presents after hospitalization for COVID-19 pneumonia.  1. Shortness of breath - multifactorial 2. COVID 19 pneumonia 3. Heart Failure with preserved ejection fraction 4. 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  5. Seasonal allergic rhinitis 6.  OSA, untreated   Plan/Recommendations: Destiny Day has dyspnea on exertion and poorly controlled allergic rhinitis which is greatly affecting her current symptoms.  I recommend starting fluticasone intranasal daily, as well as switching her loratadine to cetirizine.  She was also only taking her Symbicort once a day, but will start taking it twice a day now.  I have refilled her ProAir.  In the future we will need to address her OSA.  She is still occasionally smoking a cigarette.  Return to Care: Return in about 3 months (around 09/13/2019).  Lenice Llamas, MD Pulmonary and [place]  CC: Destiny Hatchet, MD         Patient Instructions by Spero Geralds, MD at 06/13/2019 1:30 PM  Author: Spero Geralds, MD Author Type: Physician Filed: 06/13/2019 2:10 PM  Note Status: Addendum Cosign: Cosign Not Required Encounter Date: 06/13/2019  Editor: Spero Geralds, MD (Physician)      Prior Versions: 1. Spero Geralds, MD (Physician) at 06/13/2019 2:10 PM - Addendum   2. Spero Geralds, MD (Physician) at 06/13/2019 2:06 PM - Addendum   3. Spero Geralds, MD (Physician) at 06/13/2019 2:05 PM - Addendum   4. Spero Geralds, MD (Physician) at 06/13/2019 2:03 PM - Signed    Your current symptoms of cough and sneezing are related to your allergies.   Let's do the following plan to  get this under control.   Stop taking claritin - start taking zyrtec (cetirizine) 1 tablet at bedtime. It might make you a little sleepy. You can find this at [facility].   Flonase - fluticasone - 1 spray on each side of your nose twice a day for first week, then 1 spray on each side.              Instructions for use: ? If you also use a saline nasal spray or rinse, use that first. ? Position the head with the chin slightly tucked. Use the right hand to spray into the left nostril and the right hand to spray into the left nostril.   Point the bottle away from the septum of your nose (cartilage that divides the two sides of your nose).  ? Hold the nostril closed on the opposite side from where you will spray ? Spray once and gently sniff to pull the medicine into the higher parts of your nose.  Don't sniff too hard as the medicine will drain down the back of your throat instead. ? Repeat with a second spray on the same side if prescribed. ? Repeat on the other side of your nose.  Please start taking the symbicort twice a day.  You can take the albuterol (proair) as a rescue inhaler as needed.  ------------------------------------------------------------------------------------------------------------------- Metered Dose Inhaler (MDI) Instructions (symbicort)  Before using your inhaler for the first time: 1. Take the cap off the mouthpiece. 2. Shake the inhaler for 5 seconds 3. Press down on the canister to spray the medicine  into the air. 4. Repeat these steps 3 more times. If you haven't used your inhaler in more than 2 weeks, repeat these steps before using it.  To use your inhaler: 1. Take the cap off the mouthpiece 2. Shake the inhaler for 5 seconds. 3. Hold it upright with your finger on the top of the canister and your thumb on the bottom of the inhaler. 4. Breathe out. 5. Close your lips around the mouthpiece. 6. As you start to inhale the next breath, press down on the  canister. 7. Inhale deeply and slowly through your mouth. 8. Hold your breath for 5 to 10 seconds to keep the medicine in your lungs. 9. Let your breath out.   10. Repeat these steps if you are supposed to take 2 puffs.  11. Put the cap back on the mouthpiece 12. Remember to rinse, gargle and spit with water after use if your inhaler has a steroid in it (Advair, Symbicort, Dulera, Qvar, Flovent)  Checking your MDI It is important that you know how much medication is left in your inhaler. The number of puffs contained in your MDI is printed on the side of the canister. After you have used that number of puffs, you must discard your inhaler even if it continues to spray. Keep track of how many puffs you have used. You also must include priming puffs in this total. If you use an MDI every day for control of symptoms, you can determine how long it will last by dividing the total number of puffs in the MDI by the total puffs you use every day. For example: 2 puffs x 2 times per day = 4 total puffs per day. At 120 puffs, the MDI will last 30 days. If you use an inhaler only when you need to, you must keep track of how many times you spray the inhaler. Some of the newer MDIs have counting devices built in.  If your MDI does not have a dose counter, you can obtain a device that attaches to the MDI and counts down the number of puffs each time you press the inhaler. Ask your health care professional for more information about these devices, as well as how to best keep track of your medicine without an add-on device (if you prefer).           Instructions     Return in about 3 months (around 09/13/2019).  Your current symptoms of cough and sneezing are related to your allergies.   Let's do the following plan to get this under control.   Stop taking claritin - start taking zyrtec (cetirizine) 1 tablet at bedtime. It might make you a little sleepy. You can find this at [facility].   Flonase -  fluticasone - 1 spray on each side of your nose twice a day for first week, then 1 spray on each side.              Instructions for use: ? If you also use a saline nasal spray or rinse, use that first. ? Position the head with the chin slightly tucked. Use the right hand to spray into the left nostril and the right hand to spray into the left nostril.   Point the bottle away from the septum of your nose (cartilage that divides the two sides of your nose).  ? Hold the nostril closed on the opposite side from where you will spray ? Spray once and gently sniff to pull the  medicine into the higher parts of your nose.  Don't sniff too hard as the medicine will drain down the back of your throat instead. ? Repeat with a second spray on the same side if prescribed. ? Repeat on the other side of your nose.  Please start taking the symbicort twice a day.  You can take the albuterol (proair) as a rescue inhaler as needed.  ------------------------------------------------------------------------------------------------------------------- Metered Dose Inhaler (MDI) Instructions (symbicort)  Before using your inhaler for the first time: 1. Take the cap off the mouthpiece. 2. Shake the inhaler for 5 seconds 3. Press down on the canister to spray the medicine into the air. 4. Repeat these steps 3 more times. If you haven't used your inhaler in more than 2 weeks, repeat these steps before using it.  To use your inhaler: 1. Take the cap off the mouthpiece 2. Shake the inhaler for 5 seconds. 3. Hold it upright with your finger on the top of the canister and your thumb on the bottom of the inhaler. 4. Breathe out. 5. Close your lips around the mouthpiece. 6. As you start to inhale the next breath, press down on the canister. 7. Inhale deeply and slowly through your mouth. 8. Hold your breath for 5 to 10 seconds to keep the medicine in your lungs. 9. Let your breath out.   10. Repeat these steps if  you are supposed to take 2 puffs.  11. Put the cap back on the mouthpiece 12. Remember to rinse, gargle and spit with water after use if your inhaler has a steroid in it (Advair, Symbicort, Dulera, Qvar, Flovent)  Checking your MDI It is important that you know how much medication is left in your inhaler. The number of puffs contained in your MDI is printed on the side of the canister. After you have used that number of puffs, you must discard your inhaler even if it continues to spray. Keep track of how many puffs you have used. You also must include priming puffs in this total. If you use an MDI every day for control of symptoms, you can determine how long it will last by dividing the total number of puffs in the MDI by the total puffs you use every day. For example: 2 puffs x 2 times per day = 4 total puffs per day. At 120 puffs, the MDI will last 30 days. If you use an inhaler only when you need to, you must keep track of how many times you spray the inhaler. Some of the newer MDIs have counting devices built in.  If your MDI does not have a dose counter, you can obtain a device that attaches to the MDI and counts down the number of puffs each time you press the inhaler. Ask your health care professional for more information about these devices, as well as how to best keep track of your medicine without an add-on device (if you prefer).       Reason for call: Cough and mucus is clear for has cough syrup, Codeine with guaifenesin 10-100 mg/61ml it is not helping.  Coughing for months day and night.  She is on dialysis (cold in the room, cold air blowing her her)oughs day and night.  Denies fever, chills or body aches.  Denies sob or wheezing.  Has had 3 covid vaccines and flu vaccine.  She states she dos not feel bad.  No sick contacts.  Out of Symbicort (cost is an issue) and proair.  She  goes to Dialysis MWF. She has not been seen in the office since 06/13/19.  Advised I would make Dr. Shearon Stalls aware  and once we hear back from her we will call her back.  I also advised her that she is overdue for an office visit.  Dr. Shearon Stalls, please advise.  Thank you.  (examples of things to ask: : When did symptoms start? Fever? Cough? Productive? Color to sputum? More sputum than usual? Wheezing? Have you needed increased oxygen? Are you taking your respiratory medications? What over the counter measures have you tried?)  Allergies  Allergen Reactions  . Adhesive  [Tape] Hives  . Lisinopril Cough  . Prednisone Swelling and Other (See Comments)    Excessive fluid buildup    Immunization History  Administered Date(s) Administered  . Hepatitis B, adult 01/24/2016  . Influenza, High Dose Seasonal PF 04/19/2019  . Influenza,inj,Quad PF,6+ Mos 04/14/2013  . Influenza,inj,quad, With Preservative 04/19/2019  . Moderna Sars-Covid-2 Vaccination 09/08/2019, 10/06/2019  . Pneumococcal Conjugate-13 05/02/2018  . Pneumococcal Polysaccharide-23 01/10/2016  . Tdap 09/11/2011

## 2020-08-01 NOTE — Telephone Encounter (Signed)
Advise covid test if these symptoms are new in the last week. Otherwise needs appointment.

## 2020-08-02 DIAGNOSIS — N2581 Secondary hyperparathyroidism of renal origin: Secondary | ICD-10-CM | POA: Diagnosis not present

## 2020-08-02 DIAGNOSIS — N186 End stage renal disease: Secondary | ICD-10-CM | POA: Diagnosis not present

## 2020-08-02 DIAGNOSIS — Z992 Dependence on renal dialysis: Secondary | ICD-10-CM | POA: Diagnosis not present

## 2020-08-05 DIAGNOSIS — N186 End stage renal disease: Secondary | ICD-10-CM | POA: Diagnosis not present

## 2020-08-05 DIAGNOSIS — Z992 Dependence on renal dialysis: Secondary | ICD-10-CM | POA: Diagnosis not present

## 2020-08-05 DIAGNOSIS — N2581 Secondary hyperparathyroidism of renal origin: Secondary | ICD-10-CM | POA: Diagnosis not present

## 2020-08-06 ENCOUNTER — Ambulatory Visit (INDEPENDENT_AMBULATORY_CARE_PROVIDER_SITE_OTHER): Payer: Medicare Other

## 2020-08-06 DIAGNOSIS — J309 Allergic rhinitis, unspecified: Secondary | ICD-10-CM | POA: Diagnosis not present

## 2020-08-07 DIAGNOSIS — N2581 Secondary hyperparathyroidism of renal origin: Secondary | ICD-10-CM | POA: Diagnosis not present

## 2020-08-07 DIAGNOSIS — N186 End stage renal disease: Secondary | ICD-10-CM | POA: Diagnosis not present

## 2020-08-07 DIAGNOSIS — Z992 Dependence on renal dialysis: Secondary | ICD-10-CM | POA: Diagnosis not present

## 2020-08-09 DIAGNOSIS — Z992 Dependence on renal dialysis: Secondary | ICD-10-CM | POA: Diagnosis not present

## 2020-08-09 DIAGNOSIS — N186 End stage renal disease: Secondary | ICD-10-CM | POA: Diagnosis not present

## 2020-08-09 DIAGNOSIS — N2581 Secondary hyperparathyroidism of renal origin: Secondary | ICD-10-CM | POA: Diagnosis not present

## 2020-08-12 DIAGNOSIS — N186 End stage renal disease: Secondary | ICD-10-CM | POA: Diagnosis not present

## 2020-08-12 DIAGNOSIS — Z992 Dependence on renal dialysis: Secondary | ICD-10-CM | POA: Diagnosis not present

## 2020-08-12 DIAGNOSIS — N2581 Secondary hyperparathyroidism of renal origin: Secondary | ICD-10-CM | POA: Diagnosis not present

## 2020-08-13 ENCOUNTER — Ambulatory Visit (INDEPENDENT_AMBULATORY_CARE_PROVIDER_SITE_OTHER): Payer: Medicare Other | Admitting: *Deleted

## 2020-08-13 ENCOUNTER — Other Ambulatory Visit: Payer: Self-pay

## 2020-08-13 ENCOUNTER — Ambulatory Visit (INDEPENDENT_AMBULATORY_CARE_PROVIDER_SITE_OTHER): Payer: Medicare Other | Admitting: Internal Medicine

## 2020-08-13 ENCOUNTER — Encounter: Payer: Self-pay | Admitting: Internal Medicine

## 2020-08-13 ENCOUNTER — Ambulatory Visit: Payer: Medicare Other | Admitting: Internal Medicine

## 2020-08-13 ENCOUNTER — Other Ambulatory Visit: Payer: Self-pay | Admitting: *Deleted

## 2020-08-13 VITALS — BP 130/60 | HR 103 | Temp 97.4°F | Ht 65.0 in | Wt 147.4 lb

## 2020-08-13 DIAGNOSIS — J301 Allergic rhinitis due to pollen: Secondary | ICD-10-CM

## 2020-08-13 DIAGNOSIS — J449 Chronic obstructive pulmonary disease, unspecified: Secondary | ICD-10-CM

## 2020-08-13 DIAGNOSIS — N186 End stage renal disease: Secondary | ICD-10-CM

## 2020-08-13 DIAGNOSIS — K219 Gastro-esophageal reflux disease without esophagitis: Secondary | ICD-10-CM

## 2020-08-13 DIAGNOSIS — J309 Allergic rhinitis, unspecified: Secondary | ICD-10-CM | POA: Diagnosis not present

## 2020-08-13 MED ORDER — MONTELUKAST SODIUM 10 MG PO TABS: 10.0000 mg | ORAL_TABLET | Freq: Every day | ORAL | 0 refills | Status: DC

## 2020-08-13 NOTE — Progress Notes (Signed)
Destiny Day    [id number]    04-21-1951  Primary Care Physician:Stowe, Rachel Moulds, NP Date of Appointment: 08/13/2020 Established Patient Visit  Chief complaint:   Chief Complaint  Patient presents with  . Follow-up    Cough is no better.  Gets too hot or too cold she coughs.     HPI: Dest[place] Day is a 70 y.o. woman with ESRD MWF, asthma and allergic rhinitis.  Interval Updates: She is here for follow up for cough today. It has been over a year since she last followed up.  She coughs the most when she goes to dialysis. She also coughs at night, and it wakes her up from sleep.  Cough is usually dry, occasionally with white phlegm, no blood. She has post nasal drainage and frequent throat clearing. Currently taking an OTC anti-histamine. Not taking singulair. Rhinorrhea is persistent  Has been off on inhalers. She never filled them when she came to [place] in 2020 due to cost and taking too many medications. She is currently on subcutaneous immunotherapy. Denies chest tightness or wheezing.  Current Regimen: supposed to be on proair and symbicort, currently taking neither Asthma Triggers: seasonal allergies, dust, bleach and cleaning products Exacerbations in the last year: she does not take prednisone because she had an experience with fluid retention.  History of hospitalization or intubation: yes hospitalized, but never intubated. Has been on bipap.  Atopy: no hives/eczema Allergy Testing: yes, May 2021 had positive aeroallergen testing to dust mites, alternaria aspergillus other molds, dog and grass GERD: yes, takes omeprazole BID. Still having reflux through that.  Allergic Rhinitis: yes - on flonase ACT:  Asthma Control Test ACT Total Score  08/13/2020 19  04/16/2020 24  12/07/2019 20   FeNO:  I have reviewed the patient's family social and past medical history and updated as appropriate.   Past Medical History:  Diagnosis Date  . Abdominal bruit    . Anemia   . Anxiety   . Arthritis    Osteoarthritis  . Asthma   . Cervical disc disease    "pinced nerve"  . CHF (congestive heart failure) ([place])   . Complication of anesthesia    " after I got home from my last procedure, I started itching."  . Diabetes mellitus    Type II  . Diverticulitis   . ESRD (end stage renal disease) ([place])    dialysis - M/W/F- [place]  . GERD (gastroesophageal reflux disease)    from medications  . GI bleed 03/31/2013  . Head injury 07/2017  . History of hiatal hernia   . Hyperlipidemia   . Hypertension   . Neuropathy    left leg  . Osteoporosis   . Peripheral vascular disease ([place])   . Pneumonia    "very young" and a few years ago  . PONV (postoperative nausea and vomiting)   . Seasonal allergies   . Shortness of breath dyspnea    WIth exertion, when fluid builds  . Sleep apnea    can't afford cpap     Past Surgical History:  Procedure Laterality Date  . A/V SHUNTOGRAM N/A 09/22/2016   Procedure: A/V Shuntogram - left arm;  Surgeon: Serafina Mitchell, MD;  Location: [facility];  Service: Cardiovascular;  Laterality: N/A;  . A/V SHUNTOGRAM N/A 03/22/2018   Procedure: A/V SHUNTOGRAM - left arm;  Surgeon: Serafina Mitchell, MD;  Location: [facility];  Service:  Cardiovascular;  Laterality: N/A;  . A/V SHUNTOGRAM Left 11/17/2018   Procedure: A/V SHUNTOGRAM;  Surgeon: Angelia Mould, MD;  Location: [facility];  Service: Cardiovascular;  Laterality: Left;  . ABDOMINAL HYSTERECTOMY  1993`  . AMPUTATION Left 09/01/2016   Procedure: LEFT FOOT TRANSMETATARSAL AMPUTATION;  Surgeon: Newt Minion, MD;  Location: [facility];  Service: Orthopedics;  Laterality: Left;  . AMPUTATION Right 08/11/2017   Procedure: RIGHT GREAT TOE AMPUTATION DIGIT;  Surgeon: Rosetta Posner, MD;  Location: [facility];  Service: Vascular;  Laterality: Right;  . AMPUTATION Right 12/31/2017   Procedure: RIGHT TRANSMETATARSAL AMPUTATION;  Surgeon: Newt Minion, MD;   Location: [facility];  Service: Orthopedics;  Laterality: Right;  . AV FISTULA PLACEMENT Left 04/21/2016   Procedure: INSERTION OF ARTERIOVENOUS (AV) GORE-TEX GRAFT ARM LEFT;  Surgeon: Elam Dutch, MD;  Location: [facility];  Service: Vascular;  Laterality: Left;  . [place] TRANSPOSITION Left 07/10/2014   Procedure: BASCILIC VEIN TRANSPOSITION;  Surgeon: Angelia Mould, MD;  Location: [facility];  Service: Vascular;  Laterality: Left;  . BASCILIC VEIN TRANSPOSITION Right 11/08/2014   Procedure: FIRST STAGE BASILIC VEIN TRANSPOSITION;  Surgeon: Angelia Mould, MD;  Location: [facility];  Service: Vascular;  Laterality: Right;  . BASCILIC VEIN TRANSPOSITION Right 01/18/2015   Procedure: SECOND STAGE BASILIC VEIN TRANSPOSITION;  Surgeon: Angelia Mould, MD;  Location: [facility];  Service: Vascular;  Laterality: Right;  . CRANIOTOMY N/A 08/23/2017   Procedure: CRANIOTOMY HEMATOMA EVACUATION SUBDURAL;  Surgeon: Ashok Pall, MD;  Location: [facility];  Service: Neurosurgery;  Laterality: N/A;  . CRANIOTOMY Left 08/24/2017   Procedure: CRANIOTOMY FOR RECURRENT ACUTE SUBDURAL HEMATOMA;  Surgeon: Ashok Pall, MD;  Location: [facility];  Service: Neurosurgery;  Laterality: Left;  . ESOPHAGOGASTRODUODENOSCOPY N/A 03/31/2013   Procedure: ESOPHAGOGASTRODUODENOSCOPY (EGD);  Surgeon: Gatha Mayer, MD;  Location: [facility];  Service: Endoscopy;  Laterality: N/A;  . EYE SURGERY     laser surgery  . FEMORAL-POPLITEAL BYPASS GRAFT Left 07/08/2016   Procedure: LEFT  FEMORAL-BELOW KNEE POPLITEAL ARTERY BYPASS GRAFT USING 6MM X 80 CM PROPATEN GORETEX GRAFT WITH RINGS.;  Surgeon: Rosetta Posner, MD;  Location: [facility];  Service: Vascular;  Laterality: Left;  . FEMORAL-POPLITEAL BYPASS GRAFT Right 08/11/2017   Procedure: RIGHT FEMORAL TO BELOW KNEE POPLITEAL ARTERKY  BYPASS GRAFT USING 6MM RINGED PROPATEN GRAFT;  Surgeon: Rosetta Posner, MD;  Location: [facility] [place];  Service: Vascular;  Laterality: Right;  . FISTULOGRAM Left  10/29/2014   Procedure: FISTULOGRAM;  Surgeon: Angelia Mould, MD;  Location: [facility]ATH LAB;  Service: Cardiovascular;  Laterality: Left;  [place] GAS/FLUID EXCHANGE Left 12/20/2018   Procedure: AIR GAS EXCHANGE;  Surgeon: Jalene Mullet, MD;  Location: [facility];  Service: Ophthalmology;  Laterality: Left;  . KNEE ARTHROSCOPY Right 05/06/2018   Procedure: RIGHT KNEE ARTHROSCOPY;  Surgeon: Newt Minion, MD;  Location: [facility];  Service: Orthopedics;  Laterality: Right;  . KNEE ARTHROSCOPY Right 06/10/2018   Procedure: RIGHT KNEE ARTHROSCOPY AND DEBRIDEMENT;  Surgeon: Newt Minion, MD;  Location: [facility];  Service: Orthopedics;  Laterality: Right;  . LIGATION OF ARTERIOVENOUS  FISTULA Left 04/21/2016   Procedure: LIGATION OF ARTERIOVENOUS  FISTULA LEFT ARM;  Surgeon: Elam Dutch, MD;  Location: [place];  Service: Vascular;  Laterality: Left;  . LOWER EXTREMITY ANGIOGRAPHY N/A 04/06/2017   Procedure: Lower Extremity Angiography - Right;  Surgeon: Serafina Mitchell, MD;  Location: [facility];  Service: Cardiovascular;  Laterality: N/A;  . MEMBRANE PEEL Left 12/20/2018   Procedure: MEMBRANE PEEL;  Surgeon: Jalene Mullet, MD;  Location: [facility];  Service: Ophthalmology;  Laterality: Left;  . PATCH ANGIOPLASTY Right 01/18/2015   Procedure: BASILIC VEIN PATCH ANGIOPLASTY USING VASCUGUARD PATCH;  Surgeon: Angelia Mould, MD;  Location: [facility];  Service: Vascular;  Laterality: Right;  . PERIPHERAL VASCULAR BALLOON ANGIOPLASTY  09/22/2016   Procedure: Peripheral Vascular Balloon Angioplasty;  Surgeon: Serafina Mitchell, MD;  Location: [facility];  Service: Cardiovascular;;  Lt. Fistula  . PERIPHERAL VASCULAR BALLOON ANGIOPLASTY Left 03/22/2018   Procedure: PERIPHERAL VASCULAR BALLOON ANGIOPLASTY;  Surgeon: Serafina Mitchell, MD;  Location: [facility];  Service: Cardiovascular;  Laterality: Left;  Arm shunt  . PERIPHERAL VASCULAR CATHETERIZATION N/A 06/23/2016   Procedure: Abdominal  Aortogram w/Lower Extremity;  Surgeon: Serafina Mitchell, MD;  Location: [facility];  Service: Cardiovascular;  Laterality: N/A;  . PERIPHERAL VASCULAR CATHETERIZATION  06/23/2016   Procedure: Peripheral Vascular Intervention;  Surgeon: Serafina Mitchell, MD;  Location: [facility];  Service: Cardiovascular;;  lt common and external illiac artery  . PHOTOCOAGULATION WITH LASER Left 12/20/2018   Procedure: PHOTOCOAGULATION WITH LASER;  Surgeon: Jalene Mullet, MD;  Location: [facility];  Service: Ophthalmology;  Laterality: Left;  . 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 Other Mother        not sure of cause of death  . Diabetes Father   . Pancreatic cancer Maternal Grandmother   . Diabetes Sister   . Diabetes Sister   . Colon cancer Neg Hx     Social History   Occupational History  . 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  Tobacco Use  . Smoking status: Former Smoker    Packs/day: 0.35    Years: 40.00    Pack years: 14.00    Types: Cigarettes    Quit date: 05/10/2012    Years since quitting: 8.2  . Smokeless tobacco: Never Used  Vaping Use  . Vaping Use: Never used  Substance and Sexual Activity  . Alcohol use: No  . Drug use: No    Comment: marijuana; quit in early 1980's  . Sexual activity: Yes     Physical Exam: Blood pressure 130/60, pulse (!) 103, temperature (!) 97.4 F (36.3 C), temperature source Temporal, height 5\' 5"  (1.651 m), weight 147 lb 6.4 oz (66.9 kg), SpO2 96 %.  Gen:      No acute distress ENT:  no nasal polyps, mucus membranes moist, +cobblestoning in the oropharynx Lungs:    No increased respiratory effort, symmetric chest wall excursion, clear to auscultation bilaterally, no wheezes  or crackles CV:         Regular rate and rhythm; no murmurs, rubs, or gallops.  No pedal edema   Data Reviewed: Imaging: I have personally reviewed the chest xray from Nov 202 which shows bilateral pleural effusions and pulmonary edema  PFTs: None on file  Labs: Lab Results  Component Value Date   WBC 6.4 12/08/2019   HGB 11.2 12/08/2019   HCT 32.1 (L) 12/08/2019   MCV 92 12/08/2019   PLT 269 12/08/2019   No eosinophilia  Noted on diff.  Allergy testing reviewed and noted above.   Immunization status: Immunization History  Administered Date(s) Administered  . Hepatitis B, adult 01/24/2016  . Influenza, High Dose Seasonal PF 04/19/2019, 04/24/2020  . Influenza,inj,Quad PF,6+ Mos 04/14/2013  . Influenza,inj,quad, With Preservative 04/19/2019  . Moderna Sars-Covid-2 Vaccination 09/08/2019, 10/06/2019, 07/24/2020  . Pneumococcal Conjugate-13 05/02/2018  . Pneumococcal Polysaccharide-23 01/10/2016  . Tdap 09/11/2011    Assessment:  Ms. Latorre is a 70 year old woman with history of end-stage renal disease who presents after hospitalization for COVID-19 pneumonia.  1. Chronic Cough - multifactorial from rhinitis and possible GERD 2. Asthma COPD Overlap syndrome 3. Allergic RHinitis 4. GERD 5. ESRD on HD MWF  Plan/Recommendations: Her symptoms today are primarily secondary to cough. Continue following with allergic for SCIT. Modified flonase technique. Prescribed singulair. I'd like her to start using astelin again as well. She appears euvolemic and without evidence of asthma exacerbation at this time. Continue PPI BID for GERD. WE discussed lifestyle modifications for GERD as well.   I spent 30 minutes on 08/13/2020 in care of this patient including face to face time and non-face to face time spent charting, review of outside records, and coordination of care.   Return to Care: Return in about 3 months (around 11/11/2020).   Lenice Llamas, MD Pulmonary and [place]

## 2020-08-13 NOTE — Patient Instructions (Addendum)
The patient should have follow up scheduled with myself in 3 months.   Start taking astelin nasal spray and the flonase.  Start taking singulair pill for your drainage.   Flonase - 1 spray on each side of your nose twice a day for first week, then 1 spray on each side.   Instructions for use:  If you also use a saline nasal spray or rinse, use that first.  Position the head with the chin slightly tucked. Use the right hand to spray into the left nostril and the right hand to spray into the left nostril.   Point the bottle away from the septum of your nose (cartilage that divides the two sides of your nose).   Hold the nostril closed on the opposite side from where you will spray  Spray once and gently sniff to pull the medicine into the higher parts of your nose.  Don't sniff too hard as the medicine will drain down the back of your throat instead.  Repeat with a second spray on the same side if prescribed.  Repeat on the other side of your nose.   What is GERD? Gastroesophageal reflux disease (GERD) is gastroesophageal reflux diseasewhich occurs when the lower esophageal sphincter (LES) opens spontaneously, for varying periods of time, or does not close properly and stomach contents rise up into the esophagus. GER is also called acid reflux or acid regurgitation, because digestive juices-called acids-rise up with the food. The esophagus is the tube that carries food from the mouth to the stomach. The LES is a ring of muscle at the bottom of the esophagus that acts like a valve between the esophagus and stomach.  When acid reflux occurs, food or fluid can be tasted in the back of the mouth. When refluxed stomach acid touches the lining of the esophagus it may cause a burning sensation in the chest or throat called heartburn or acid indigestion. Occasional reflux is common. Persistent reflux that occurs more than twice a week is considered GERD, and it can eventually lead to more serious  health problems. People of all ages can have GERD. Studies have shown that GERD may worsen or contribute to asthma, chronic cough, and pulmonary fibrosis.   What are the symptoms of GERD? The main symptom of GERD in adults is frequent heartburn, also called acid indigestion-burning-type pain in the lower part of the mid-chest, behind the breast bone, and in the mid-abdomen.  Not all reflux is acidic in nature, and many patients don't have heart burn at all. Sometimes it feels like a cough (either dry or with mucus), choking sensation, asthma, shortness of breath, waking up at night, frequent throat clearing, or trouble swallowing.    What causes GERD? The reason some people develop GERD is still unclear. However, research shows that in people with GERD, the LES relaxes while the rest of the esophagus is working. Anatomical abnormalities such as a hiatal hernia may also contribute to GERD. A hiatal hernia occurs when the upper part of the stomach and the LES move above the diaphragm, the muscle wall that separates the stomach from the chest. Normally, the diaphragm helps the LES keep acid from rising up into the esophagus. When a hiatal hernia is present, acid reflux can occur more easily. A hiatal hernia can occur in people of any age and is most often a normal finding in otherwise healthy people over age 73. Most of the time, a hiatal hernia produces no symptoms.   Other factors  that may contribute to GERD include - Obesity or recent weight gain - Pregnancy  - Smoking  - Diet - Certain medications  Common foods that can worsen reflux symptoms include: - carbonated beverages - artificial sweeteners - citrus fruits  - chocolate  - drinks with caffeine or alcohol  - fatty and fried foods  - garlic and onions  - mint flavorings  - spicy foods  - tomato-based foods, like spaghetti sauce, salsa, chili, and pizza   Lifestyle Changes If you smoke, stop.  Avoid foods and beverages that worsen  symptoms (see above.) Lose weight if needed.  Eat small, frequent meals.  Wear loose-fitting clothes.  Avoid lying down for 3 hours after a meal.  Raise the head of your bed 6 to 8 inches by securing wood blocks under the bedposts. Just using extra pillows will not help, but using a wedge-shaped pillow may be helpful.  Medications  H2 blockers, such as cimetidine (Tagamet HB), famotidine (Pepcid AC), nizatidine (Axid AR), and ranitidine (Zantac 75), decrease acid production. They are available in prescription strength and over-the-counter strength. These drugs provide short-term relief and are effective for about half of those who have GERD symptoms.  Proton pump inhibitors include omeprazole (Prilosec, Zegerid), lansoprazole (Prevacid), pantoprazole (Protonix), rabeprazole (Aciphex), and esomeprazole (Nexium), which are available by prescription. Prilosec is also available in over-the-counter strength. Proton pump inhibitors are more effective than H2 blockers and can relieve symptoms and heal the esophageal lining in almost everyone who has GERD.  Because drugs work in different ways, combinations of medications may help control symptoms. People who get heartburn after eating may take both antacids and H2 blockers. The antacids work first to neutralize the acid in the stomach, and then the H2 blockers act on acid production. By the time the antacid stops working, the H2 blocker will have stopped acid production. Your health care provider is the best source of information about how to use medications for GERD.   Points to Remember 1. You can have GERD without having heartburn. Your symptoms could include a dry cough, asthma symptoms, or trouble swallowing.  2. Taking medications daily as prescribed is important in controlling you symptoms.  Sometimes it can take up to 8 weeks to fully achieve the effects of the medications prescribed.  3. Coughing related to GERD can be difficult to treat and  is very frustrating!  However, it is important to stick with these medications and lifestyle modifications before pursuing more aggressive or invasive test and treatments.

## 2020-08-14 DIAGNOSIS — N2581 Secondary hyperparathyroidism of renal origin: Secondary | ICD-10-CM | POA: Diagnosis not present

## 2020-08-14 DIAGNOSIS — N186 End stage renal disease: Secondary | ICD-10-CM | POA: Diagnosis not present

## 2020-08-14 DIAGNOSIS — Z992 Dependence on renal dialysis: Secondary | ICD-10-CM | POA: Diagnosis not present

## 2020-08-16 DIAGNOSIS — Z992 Dependence on renal dialysis: Secondary | ICD-10-CM | POA: Diagnosis not present

## 2020-08-16 DIAGNOSIS — N2581 Secondary hyperparathyroidism of renal origin: Secondary | ICD-10-CM | POA: Diagnosis not present

## 2020-08-16 DIAGNOSIS — N186 End stage renal disease: Secondary | ICD-10-CM | POA: Diagnosis not present

## 2020-08-19 DIAGNOSIS — Z992 Dependence on renal dialysis: Secondary | ICD-10-CM | POA: Diagnosis not present

## 2020-08-19 DIAGNOSIS — N2581 Secondary hyperparathyroidism of renal origin: Secondary | ICD-10-CM | POA: Diagnosis not present

## 2020-08-19 DIAGNOSIS — N186 End stage renal disease: Secondary | ICD-10-CM | POA: Diagnosis not present

## 2020-08-20 ENCOUNTER — Ambulatory Visit (INDEPENDENT_AMBULATORY_CARE_PROVIDER_SITE_OTHER): Payer: Medicare Other

## 2020-08-20 DIAGNOSIS — J309 Allergic rhinitis, unspecified: Secondary | ICD-10-CM | POA: Diagnosis not present

## 2020-08-21 DIAGNOSIS — N186 End stage renal disease: Secondary | ICD-10-CM | POA: Diagnosis not present

## 2020-08-21 DIAGNOSIS — N2581 Secondary hyperparathyroidism of renal origin: Secondary | ICD-10-CM | POA: Diagnosis not present

## 2020-08-21 DIAGNOSIS — Z992 Dependence on renal dialysis: Secondary | ICD-10-CM | POA: Diagnosis not present

## 2020-08-21 DIAGNOSIS — H2511 Age-related nuclear cataract, right eye: Secondary | ICD-10-CM | POA: Diagnosis not present

## 2020-08-22 ENCOUNTER — Other Ambulatory Visit: Payer: Self-pay

## 2020-08-22 ENCOUNTER — Encounter (INDEPENDENT_AMBULATORY_CARE_PROVIDER_SITE_OTHER): Payer: Self-pay | Admitting: Ophthalmology

## 2020-08-22 ENCOUNTER — Ambulatory Visit (INDEPENDENT_AMBULATORY_CARE_PROVIDER_SITE_OTHER): Payer: Medicare Other | Admitting: Ophthalmology

## 2020-08-22 DIAGNOSIS — H2511 Age-related nuclear cataract, right eye: Secondary | ICD-10-CM

## 2020-08-22 DIAGNOSIS — E1165 Type 2 diabetes mellitus with hyperglycemia: Secondary | ICD-10-CM | POA: Diagnosis not present

## 2020-08-22 DIAGNOSIS — E113521 Type 2 diabetes mellitus with proliferative diabetic retinopathy with traction retinal detachment involving the macula, right eye: Secondary | ICD-10-CM | POA: Diagnosis not present

## 2020-08-22 DIAGNOSIS — Z794 Long term (current) use of insulin: Secondary | ICD-10-CM

## 2020-08-22 DIAGNOSIS — E113511 Type 2 diabetes mellitus with proliferative diabetic retinopathy with macular edema, right eye: Secondary | ICD-10-CM

## 2020-08-22 DIAGNOSIS — E113512 Type 2 diabetes mellitus with proliferative diabetic retinopathy with macular edema, left eye: Secondary | ICD-10-CM

## 2020-08-22 DIAGNOSIS — IMO0002 Reserved for concepts with insufficient information to code with codable children: Secondary | ICD-10-CM

## 2020-08-22 DIAGNOSIS — H43821 Vitreomacular adhesion, right eye: Secondary | ICD-10-CM

## 2020-08-22 HISTORY — DX: Vitreomacular adhesion, right eye: H43.821

## 2020-08-22 MED ORDER — BEVACIZUMAB 2.5 MG/0.1ML IZ SOSY
2.5000 mg | PREFILLED_SYRINGE | INTRAVITREAL | Status: AC | PRN
  Administered 2020-08-22: 2.5 mg via INTRAVITREAL

## 2020-08-22 NOTE — Progress Notes (Signed)
08/22/2020     CHIEF COMPLAINT Patient presents for Retina Follow Up (7 Wk FU OU///Pt reports vision improved OU, floaters have subsided OU, no pain or pressure OU. ///Last A1C: 7.0 taken 07/2020///Last BS: 140 yesterday )   HISTORY OF PRESENT ILLNESS: Destiny Day is a 70 y.o. female who presents to the clinic today for:   HPI    Retina Follow Up    Patient presents with  Diabetic Retinopathy.  This started 7 weeks ago.  Duration of 7 weeks.  Since onset it is stable. Additional comments: 7 Wk FU OU   Pt reports vision improved OU, floaters have subsided OU, no pain or pressure OU.    Last A1C: 7.0 taken 07/2020   Last BS: 140 yesterday        Last edited by Nichola Sizer D on 08/22/2020  1:16 PM. (History)      Referring physician: Cipriano Mile, NP [place],  [place] [address]  HISTORICAL INFORMATION:   Selected notes from the MEDICAL RECORD NUMBER    Lab Results  Component Value Date   HGBA1C 5.6 06/26/2019     CURRENT MEDICATIONS: No current outpatient medications on file. (Ophthalmic Drugs)   No current facility-administered medications for this visit. (Ophthalmic Drugs)   Current Outpatient Medications (Other)  Medication Sig  . albuterol (PROAIR HFA) 108 (90 Base) MCG/ACT inhaler 4 puffs every 4-6 hours as needed  . amLODipine (NORVASC) 10 MG tablet Take 10 mg by mouth at bedtime.  Marland Kitchen aspirin EC 81 MG tablet Take 81 mg by mouth daily.  Marland Kitchen atorvastatin (LIPITOR) 40 MG tablet Take 1 tablet (40 mg total) by mouth daily.  Marland Kitchen azelastine (ASTELIN) 0.1 % nasal spray Place 1 spray into both nostrils 2 (two) times daily.  . benzonatate (TESSALON PERLES) 100 MG capsule Take 1 capsule (100 mg total) by mouth 3 (three) times daily as needed for cough.  Marland Kitchen BIDIL 20-37.5 MG tablet TAKE ONE TABLET BY MOUTH THREE TIMES A DAY   . budesonide-formoterol (SYMBICORT) 160-4.5 MCG/ACT inhaler 2 puffs twice daily with spacer (Patient not taking: Reported on  08/13/2020)  . busPIRone (BUSPAR) 7.5 MG tablet 7.5 mg.   . carvedilol (COREG) 12.5 MG tablet Take 12.5 mg by mouth See admin instructions. Take 1 tablet (12.5 mg) by mouth once daily in the evening on Mondays, Wednesdays, & Fridays. (Dialysis) Take 1 tablet (12.5 mg) by mouth twice daily on Sundays, Tuesdays, Thursdays, & Saturdays. (Non Dialysis)  . cinacalcet (SENSIPAR) 60 MG tablet Take 90 mg by mouth See admin instructions. Take 1 tablet (90 mg) by mouth in the evening on Mondays, Wednesdays, & Fridays (dialysis) Take 1 tablet (90 mg) by mouth twice daily on Sundays, Tuesdays, Thursdays, Saturdays. (non dialysis)  . diazepam (VALIUM) 5 MG tablet Take 5 mg by mouth 2 (two) times daily as needed for anxiety.  . docusate sodium (COLACE) 100 MG capsule Take 1 capsule (100 mg total) by mouth 2 (two) times daily.  Marland Kitchen EPINEPHrine 0.3 mg/0.3 mL IJ SOAJ injection Inject 0.3 mLs (0.3 mg total) into the muscle as needed for anaphylaxis.  . ferric citrate (AURYXIA) 1 GM 210 MG(Fe) tablet Take 210-420 mg by mouth See admin instructions. Take 2 tablets (420 mg) by mouth with each meal & take 1 tablet (210 mg) by mouth with snacks.  . fluticasone (FLONASE) 50 MCG/ACT nasal spray Place 2 sprays into both nostrils daily as needed for allergies or rhinitis.  Marland Kitchen gabapentin (NEURONTIN) 100  MG capsule TAKE 1 CAPSULE (100 MG TOTAL) BY MOUTH AT BEDTIME. WHEN NECESSARY FOR NEUROPATHY PAIN  . glipiZIDE (GLUCOTROL) 5 MG tablet Take 5 mg by mouth daily before breakfast.  . guaiFENesin-codeine (ROBITUSSIN AC) 100-10 MG/5ML syrup Take by mouth.  . lidocaine-prilocaine (EMLA) cream Apply 1 application topically 3 (three) times a week. 1-2 hours prior to dialysis  . LOPERAMIDE HCL PO Take by mouth.  . montelukast (SINGULAIR) 10 MG tablet Take 1 tablet (10 mg total) by mouth at bedtime.  . multivitamin (RENA-VIT) TABS tablet Take 1 tablet by mouth daily.   Marland Kitchen omeprazole (PRILOSEC) 20 MG capsule Take 20 mg by mouth daily.   .  ondansetron (ZOFRAN) 4 MG tablet Take 1 tablet (4 mg total) by mouth every 6 (six) hours as needed for nausea.  . polyethylene glycol (MIRALAX / GLYCOLAX) 17 g packet Take 17 g by mouth daily.  . traZODone (DESYREL) 50 MG tablet   . Vitamin D, Ergocalciferol, (DRISDOL) 1.25 MG (50000 UT) CAPS capsule Take 50,000 Units by mouth every Sunday. Takes on Sunday  . XYZAL ALLERGY 24HR 5 MG tablet Take 5 mg by mouth daily.   No current facility-administered medications for this visit. (Other)      REVIEW OF SYSTEMS:    ALLERGIES Allergies  Allergen Reactions  . Adhesive  [Tape] Hives  . Lisinopril Cough  . Prednisone Swelling and Other (See Comments)    Excessive fluid buildup    PAST MEDICAL HISTORY Past Medical History:  Diagnosis Date  . Abdominal bruit   . Anemia   . Anxiety   . Arthritis    Osteoarthritis  . Asthma   . Cervical disc disease    "pinced nerve"  . CHF (congestive heart failure) ([place])   . Complication of anesthesia    " after I got home from my last procedure, I started itching."  . Diabetes mellitus    Type II  . Diverticulitis   . ESRD (end stage renal disease) ([place])    dialysis - M/W/F- [place]  . GERD (gastroesophageal reflux disease)    from medications  . GI bleed 03/31/2013  . Head injury 07/2017  . History of hiatal hernia   . Hyperlipidemia   . Hypertension   . Neuropathy    left leg  . Osteoporosis   . Peripheral vascular disease ([place])   . Pneumonia    "very young" and a few years ago  . PONV (postoperative nausea and vomiting)   . Seasonal allergies   . Shortness of breath dyspnea    WIth exertion, when fluid builds  . Sleep apnea    can't afford cpap    Past Surgical History:  Procedure Laterality Date  . A/V SHUNTOGRAM N/A 09/22/2016   Procedure: A/V Shuntogram - left arm;  Surgeon: Serafina Mitchell, MD;  Location: [facility];  Service: Cardiovascular;  Laterality: N/A;  . A/V SHUNTOGRAM N/A 03/22/2018   Procedure: A/V  SHUNTOGRAM - left arm;  Surgeon: Serafina Mitchell, MD;  Location: [facility];  Service: Cardiovascular;  Laterality: N/A;  . A/V SHUNTOGRAM Left 11/17/2018   Procedure: A/V SHUNTOGRAM;  Surgeon: Angelia Mould, MD;  Location: [facility];  Service: Cardiovascular;  Laterality: Left;  . ABDOMINAL HYSTERECTOMY  1993`  . AMPUTATION Left 09/01/2016   Procedure: LEFT FOOT TRANSMETATARSAL AMPUTATION;  Surgeon: Newt Minion, MD;  Location: [facility];  Service: Orthopedics;  Laterality: Left;  . AMPUTATION Right 08/11/2017  Procedure: RIGHT GREAT TOE AMPUTATION DIGIT;  Surgeon: Rosetta Posner, MD;  Location: [facility];  Service: Vascular;  Laterality: Right;  . AMPUTATION Right 12/31/2017   Procedure: RIGHT TRANSMETATARSAL AMPUTATION;  Surgeon: Newt Minion, MD;  Location: [facility];  Service: Orthopedics;  Laterality: Right;  . AV FISTULA PLACEMENT Left 04/21/2016   Procedure: INSERTION OF ARTERIOVENOUS (AV) GORE-TEX GRAFT ARM LEFT;  Surgeon: Elam Dutch, MD;  Location: [facility];  Service: Vascular;  Laterality: Left;  . [place] TRANSPOSITION Left 07/10/2014   Procedure: BASCILIC VEIN TRANSPOSITION;  Surgeon: Angelia Mould, MD;  Location: [facility];  Service: Vascular;  Laterality: Left;  . BASCILIC VEIN TRANSPOSITION Right 11/08/2014   Procedure: FIRST STAGE BASILIC VEIN TRANSPOSITION;  Surgeon: Angelia Mould, MD;  Location: [facility];  Service: Vascular;  Laterality: Right;  . BASCILIC VEIN TRANSPOSITION Right 01/18/2015   Procedure: SECOND STAGE BASILIC VEIN TRANSPOSITION;  Surgeon: Angelia Mould, MD;  Location: [facility];  Service: Vascular;  Laterality: Right;  . CRANIOTOMY N/A 08/23/2017   Procedure: CRANIOTOMY HEMATOMA EVACUATION SUBDURAL;  Surgeon: Ashok Pall, MD;  Location: [facility];  Service: Neurosurgery;  Laterality: N/A;  . CRANIOTOMY Left 08/24/2017   Procedure: CRANIOTOMY FOR RECURRENT ACUTE SUBDURAL HEMATOMA;  Surgeon: Ashok Pall, MD;  Location: [facility];   Service: Neurosurgery;  Laterality: Left;  . ESOPHAGOGASTRODUODENOSCOPY N/A 03/31/2013   Procedure: ESOPHAGOGASTRODUODENOSCOPY (EGD);  Surgeon: Gatha Mayer, MD;  Location: [facility]OS[facility];  Service: Endoscopy;  Laterality: N/A;  . EYE SURGERY     laser surgery  . FEMORAL-POPLITEAL BYPASS GRAFT Left 07/08/2016   Procedure: LEFT  FEMORAL-BELOW KNEE POPLITEAL ARTERY BYPASS GRAFT USING 6MM X 80 CM PROPATEN GORETEX GRAFT WITH RINGS.;  Surgeon: Rosetta Posner, MD;  Location: [facility];  Service: Vascular;  Laterality: Left;  . FEMORAL-POPLITEAL BYPASS GRAFT Right 08/11/2017   Procedure: RIGHT FEMORAL TO BELOW KNEE POPLITEAL ARTERKY  BYPASS GRAFT USING 6MM RINGED PROPATEN GRAFT;  Surgeon: Rosetta Posner, MD;  Location: [facility];  Service: Vascular;  Laterality: Right;  . FISTULOGRAM Left 10/29/2014   Procedure: FISTULOGRAM;  Surgeon: Angelia Mould, MD;  [facility];  Service: Cardiovascular;  Laterality: Left;  Marland Kitchen GAS/FLUID EXCHANGE Left 12/20/2018   Procedure: AIR GAS EXCHANGE;  Surgeon: Jalene Mullet, MD;  Location: [facility];  Service: Ophthalmology;  Laterality: Left;  . KNEE ARTHROSCOPY Right 05/06/2018   Procedure: RIGHT KNEE ARTHROSCOPY;  Surgeon: Newt Minion, MD;  Location: [facility];  Service: Orthopedics;  Laterality: Right;  . KNEE ARTHROSCOPY Right 06/10/2018   Procedure: RIGHT KNEE ARTHROSCOPY AND DEBRIDEMENT;  Surgeon: Newt Minion, MD;  Location: [facility];  Service: Orthopedics;  Laterality: Right;  . LIGATION OF ARTERIOVENOUS  FISTULA Left 04/21/2016   Procedure: LIGATION OF ARTERIOVENOUS  FISTULA LEFT ARM;  Surgeon: Elam Dutch, MD;  Location: [facility];  Service: Vascular;  Laterality: Left;  . LOWER EXTREMITY ANGIOGRAPHY N/A 04/06/2017   Procedure: Lower Extremity Angiography - Right;  Surgeon: Serafina Mitchell, MD;  Location: [facility];  Service: Cardiovascular;  Laterality: N/A;  . MEMBRANE PEEL Left 12/20/2018   Procedure: MEMBRANE PEEL;  Surgeon: Jalene Mullet, MD;   Location: [facility];  Service: Ophthalmology;  Laterality: Left;  . PATCH ANGIOPLASTY Right 01/18/2015   Procedure: BASILIC VEIN PATCH ANGIOPLASTY USING VASCUGUARD PATCH;  Surgeon: Angelia Mould, MD;  Location: [facility];  Service: Vascular;  Laterality: Right;  . PERIPHERAL VASCULAR BALLOON ANGIOPLASTY  09/22/2016  Procedure: Peripheral Vascular Balloon Angioplasty;  Surgeon: Serafina Mitchell, MD;  Location: [facility];  Service: Cardiovascular;;  Lt. Fistula  . PERIPHERAL VASCULAR BALLOON ANGIOPLASTY Left 03/22/2018   Procedure: PERIPHERAL VASCULAR BALLOON ANGIOPLASTY;  Surgeon: Serafina Mitchell, MD;  Location: [facility];  Service: Cardiovascular;  Laterality: Left;  Arm shunt  . PERIPHERAL VASCULAR CATHETERIZATION N/A 06/23/2016   Procedure: Abdominal Aortogram w/Lower Extremity;  Surgeon: Serafina Mitchell, MD;  Location: [facility];  Service: Cardiovascular;  Laterality: N/A;  . PERIPHERAL VASCULAR CATHETERIZATION  06/23/2016   Procedure: Peripheral Vascular Intervention;  Surgeon: Serafina Mitchell, MD;  Location: [facility];  Service: Cardiovascular;;  lt common and external illiac artery  . PHOTOCOAGULATION WITH LASER Left 12/20/2018   Procedure: PHOTOCOAGULATION WITH LASER;  Surgeon: Jalene Mullet, MD;  Location: [facility];  Service: Ophthalmology;  Laterality: Left;  . 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   Procedure: REPAIR OF COMPLEX TRACTION RETINAL DETACHMENT;  Surgeon: Jalene Mullet, MD;  Location: [facility];  Service: Ophthalmology;  Laterality: Left;    FAMILY HISTORY Family History  Problem Relation Age of Onset  . Other Mother        not sure of cause of death  . Diabetes Father   . Pancreatic cancer Maternal Grandmother   . Diabetes Sister   . Diabetes Sister   . Colon cancer Neg Hx     SOCIAL HISTORY Social History   Tobacco Use  . Smoking status: Former Smoker    Packs/day: 0.35    Years: 40.00    Pack years: 14.00     Types: Cigarettes    Quit date: 05/10/2012    Years since quitting: 8.2  . Smokeless tobacco: Never Used  Vaping Use  . Vaping Use: Never used  Substance Use Topics  . Alcohol use: No  . Drug use: No    Comment: marijuana; quit in early 1980's         OPHTHALMIC EXAM:  Base Eye Exam    Visual Acuity (ETDRS)      Right Left   Dist [place] 20/50 -2 20/40 -2   Dist ph [place] NI NI       Tonometry (Tonopen, 1:22 PM)      Right Left   Pressure 17 19       Pupils      Pupils Dark Light Shape React APD   Right PERRL 3 3 Round Brisk None   Left PERRL 3 3 Round Brisk None       Visual Fields      Left Right    Full Full       Extraocular Movement      Right Left    Full Full       Neuro/Psych    Oriented x3: Yes   Mood/Affect: Normal       Dilation    Both eyes: 1.0% Mydriacyl, 2.5% Phenylephrine @ 1:23 PM        Slit Lamp and Fundus Exam    External Exam      Right Left   External Normal Normal       Slit Lamp Exam      Right Left   Lids/Lashes Normal Normal   Conjunctiva/Sclera White and quiet White and quiet   Cornea Clear Clear   Anterior Chamber Deep and quiet Deep and quiet   Iris Round and reactive Round and reactive   Lens Nuclear  sclerosis 3+, 2+ Cortical cataract Centered posterior chamber intraocular lens   Anterior Vitreous Normal Normal, remnant       Fundus Exam      Right Left   Posterior Vitreous Pre-retinal hemorrhage Vitrectomized, by history   Disc Neovascularization No details through cataract   C/D Ratio 0.5 0.3   Macula Microaneurysms Poor details through the dense cataract   Vessels PDR-active Good PRP, glimpses   Periphery  moderate scattter  attached peripherally          IMAGING AND PROCEDURES  Imaging and Procedures for 08/22/20  OCT, Retina - OU - Both Eyes       Right Eye Quality was good. Scan locations included subfoveal. Central Foveal Thickness: 330. Progression has improved. Findings include vitreous  traction, vitreomacular adhesion , abnormal foveal contour, cystoid macular edema.   Left Eye Quality was good. Scan locations included subfoveal. Central Foveal Thickness: 316. Progression has been stable. Findings include abnormal foveal contour, cystoid macular edema.   Notes Quiescent proliferative diabetic retinopathy exist OU we will continue to monitor.  Vitreomacular traction with less CSME OD  OS with CSME particularly temporal to the fovea.       Intravitreal Injection, Pharmacologic Agent - OS - Left Eye       Time Out 08/22/2020. 2:13 PM. Confirmed correct patient, procedure, site, and patient consented.   Anesthesia Topical anesthesia was used. Anesthetic medications included [place] 3.5%.   Procedure Preparation included 5% betadine to ocular surface, 10% betadine to eyelids, Tobramycin 0.3%.   Injection:  2.5 mg Bevacizumab (AVASTIN) 2.5mg /0.73mL SOSY   NDC: 16109-604-54, Lot: [id number]   Route: Intravitreal, Site: Left Eye  Post-op Post injection exam found visual acuity of at least counting fingers. The patient tolerated the procedure well. There were no complications. The patient received written and verbal post procedure care education. Post injection medications were not given.                 ASSESSMENT/PLAN:  Diabetic macular edema of right eye with proliferative retinopathy associated with type 2 diabetes mellitus (HCC) Improved macular edema component yet elevation remains in the right eye secondary to vitreomacular traction syndrome  Nuclear sclerotic cataract of right eye Worsening opacity, scheduled for cataract surgery right eye tomorrow  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 type 2 diabetes mellitus (HCC) CSME OS much improved overall with less subfoveal serous elevation and improved acuity now from count fingers to 20/40 (also post cataract surgery)  Will need repeat injection Avastin today to  maintain  Vitreomacular traction syndrome, right Vitreomacular traction may cause vision loss from anatomic distortion to the center of the vision, the macula.  If visual function is symptomatic or threatened, therapy may be needed.  Surgical intervention offers the highest chance of visual stability and improvement.  Distortion of the macula anatomy may cause splitting of the retinal layers, termed foveomacular retinoschisis, which can cause more permanent vision loss.  Epiretinal membranes may also be associated.  Macular hole may also develop if vitreomacular traction progresses. The minor form of this condition is Vitreomacular adhesion, which is a natural change in the aging process of the eye, which requires observation only.  Post cataract surgery may need vitrectomy to release this traction if acuity adversely affected by the elevation      ICD-10-CM   1. 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 type 2 diabetes mellitus ([place])  Y78.2956 OCT, Retina - OU -  Both Eyes    Color Fundus Photography Optos - OU - Both Eyes    Intravitreal Injection, Pharmacologic Agent - OS - Left Eye    bevacizumab (AVASTIN) SOSY 2.5 mg  2. Uncontrolled diabetes mellitus with right eye affected by proliferative retinopathy and traction retinal detachment involving macula, with long-term current use of insulin (HCC)  E11.3521 OCT, Retina - OU - Both Eyes   E11.65 Color Fundus Photography Optos - OU - Both Eyes   Z79.4   3. Diabetic macular edema of right eye with proliferative retinopathy associated with type 2 diabetes mellitus ([place])  E11.3511   4. Nuclear sclerotic cataract of right eye  H25.11   5. Vitreomacular traction syndrome, right  H43.821     1.  Proceed with cataract extraction with intraocular lens placement right eye as scheduled tomorrow, right eye  2.  Need follow-up examination right IN 6 to 8 weeks to monitor whether vitreomacular traction syndrome  worsens 3.  Similar examination in 6 weeks to monitor response to treatment of diabetic CSME OS today with intravitreal Avastin  Ophthalmic Meds Ordered this visit:  Meds ordered this encounter  Medications  . bevacizumab (AVASTIN) SOSY 2.5 mg       Return in about 6 weeks (around 10/03/2020) for DILATE OU, OCT, AVASTIN OCT, OS.  There are no Patient Instructions on file for this visit.   Explained the diagnoses, plan, and follow up with the patient and they expressed understanding.  Patient expressed understanding of the importance of proper follow up care.   Clent Demark Aleynah Rocchio M.D. Diseases & Surgery of the Retina and Vitreous Retina & Diabetic [place] 08/22/20     Abbreviations: M myopia (nearsighted); A astigmatism; H hyperopia (farsighted); P presbyopia; Mrx spectacle prescription;  CTL contact lenses; OD right eye; OS left eye; OU both eyes  XT exotropia; ET esotropia; PEK punctate epithelial keratitis; PEE punctate epithelial erosions; DES dry eye syndrome; MGD meibomian gland dysfunction; ATs artificial tears; PFAT's preservative free artificial tears; [place] nuclear sclerotic cataract; PSC posterior subcapsular cataract; ERM epi-retinal membrane; PVD posterior vitreous detachment; RD retinal detachment; DM diabetes mellitus; DR diabetic retinopathy; NPDR non-proliferative diabetic retinopathy; PDR proliferative diabetic retinopathy; CSME clinically significant macular edema; DME diabetic macular edema; dbh dot blot hemorrhages; CWS cotton wool spot; POAG primary open angle glaucoma; C/D cup-to-disc ratio; HVF humphrey visual field; GVF goldmann visual field; OCT optical coherence tomography; IOP intraocular pressure; BRVO Branch retinal vein occlusion; CRVO central retinal vein occlusion; CRAO central retinal artery occlusion; BRAO branch retinal artery occlusion; RT retinal tear; SB scleral buckle; PPV pars plana vitrectomy; VH Vitreous hemorrhage; PRP panretinal laser  photocoagulation; IVK intravitreal kenalog; VMT vitreomacular traction; MH Macular hole;  NVD neovascularization of the disc; NVE neovascularization elsewhere; AREDS age related eye disease study; ARMD age related macular degeneration; POAG primary open angle glaucoma; EBMD epithelial/anterior basement membrane dystrophy; ACIOL anterior chamber intraocular lens; IOL intraocular lens; PCIOL posterior chamber intraocular lens; Phaco/IOL phacoemulsification with intraocular lens placement; [place] photorefractive keratectomy; LASIK laser assisted in situ keratomileusis; HTN hypertension; DM diabetes mellitus; COPD chronic obstructive pulmonary disease

## 2020-08-22 NOTE — Assessment & Plan Note (Signed)
Improved macular edema component yet elevation remains in the right eye secondary to vitreomacular traction syndrome

## 2020-08-22 NOTE — Assessment & Plan Note (Signed)
Vitreomacular traction may cause vision loss from anatomic distortion to the center of the vision, the macula.  If visual function is symptomatic or threatened, therapy may be needed.  Surgical intervention offers the highest chance of visual stability and improvement.  Distortion of the macula anatomy may cause splitting of the retinal layers, termed foveomacular retinoschisis, which can cause more permanent vision loss.  Epiretinal membranes may also be associated.  Macular hole may also develop if vitreomacular traction progresses. The minor form of this condition is Vitreomacular adhesion, which is a natural change in the aging process of the eye, which requires observation only.  Post cataract surgery may need vitrectomy to release this traction if acuity adversely affected by the elevation

## 2020-08-22 NOTE — Assessment & Plan Note (Signed)
CSME OS much improved overall with less subfoveal serous elevation and improved acuity now from count fingers to 20/40 (also post cataract surgery)  Will need repeat injection Avastin today to maintain

## 2020-08-22 NOTE — Assessment & Plan Note (Signed)
Worsening opacity, scheduled for cataract surgery right eye tomorrow

## 2020-08-23 DIAGNOSIS — H25811 Combined forms of age-related cataract, right eye: Secondary | ICD-10-CM | POA: Diagnosis not present

## 2020-08-24 DIAGNOSIS — N186 End stage renal disease: Secondary | ICD-10-CM | POA: Diagnosis not present

## 2020-08-24 DIAGNOSIS — Z992 Dependence on renal dialysis: Secondary | ICD-10-CM | POA: Diagnosis not present

## 2020-08-24 DIAGNOSIS — N2581 Secondary hyperparathyroidism of renal origin: Secondary | ICD-10-CM | POA: Diagnosis not present

## 2020-08-26 DIAGNOSIS — N186 End stage renal disease: Secondary | ICD-10-CM | POA: Diagnosis not present

## 2020-08-26 DIAGNOSIS — Z992 Dependence on renal dialysis: Secondary | ICD-10-CM | POA: Diagnosis not present

## 2020-08-26 DIAGNOSIS — N2581 Secondary hyperparathyroidism of renal origin: Secondary | ICD-10-CM | POA: Diagnosis not present

## 2020-08-26 NOTE — Progress Notes (Signed)
VIALS EXP 08-27-21

## 2020-08-27 ENCOUNTER — Ambulatory Visit (INDEPENDENT_AMBULATORY_CARE_PROVIDER_SITE_OTHER): Payer: Medicare Other

## 2020-08-27 DIAGNOSIS — I159 Secondary hypertension, unspecified: Secondary | ICD-10-CM | POA: Diagnosis not present

## 2020-08-27 DIAGNOSIS — Z992 Dependence on renal dialysis: Secondary | ICD-10-CM | POA: Diagnosis not present

## 2020-08-27 DIAGNOSIS — J309 Allergic rhinitis, unspecified: Secondary | ICD-10-CM

## 2020-08-27 DIAGNOSIS — N186 End stage renal disease: Secondary | ICD-10-CM | POA: Diagnosis not present

## 2020-08-28 DIAGNOSIS — D631 Anemia in chronic kidney disease: Secondary | ICD-10-CM | POA: Diagnosis not present

## 2020-08-28 DIAGNOSIS — N2581 Secondary hyperparathyroidism of renal origin: Secondary | ICD-10-CM | POA: Diagnosis not present

## 2020-08-28 DIAGNOSIS — J3081 Allergic rhinitis due to animal (cat) (dog) hair and dander: Secondary | ICD-10-CM | POA: Diagnosis not present

## 2020-08-28 DIAGNOSIS — Z992 Dependence on renal dialysis: Secondary | ICD-10-CM | POA: Diagnosis not present

## 2020-08-28 DIAGNOSIS — N186 End stage renal disease: Secondary | ICD-10-CM | POA: Diagnosis not present

## 2020-08-28 DIAGNOSIS — E876 Hypokalemia: Secondary | ICD-10-CM | POA: Diagnosis not present

## 2020-08-28 DIAGNOSIS — T782XXA Anaphylactic shock, unspecified, initial encounter: Secondary | ICD-10-CM | POA: Diagnosis not present

## 2020-08-28 DIAGNOSIS — D689 Coagulation defect, unspecified: Secondary | ICD-10-CM | POA: Diagnosis not present

## 2020-08-28 DIAGNOSIS — D509 Iron deficiency anemia, unspecified: Secondary | ICD-10-CM | POA: Diagnosis not present

## 2020-08-29 ENCOUNTER — Ambulatory Visit (HOSPITAL_COMMUNITY)
Admission: RE | Admit: 2020-08-29 | Discharge: 2020-08-29 | Disposition: A | Discharge status: Home / Self Care | Payer: Medicare Other | Source: Ambulatory Visit | Attending: Vascular Surgery | Admitting: Vascular Surgery

## 2020-08-29 ENCOUNTER — Other Ambulatory Visit: Payer: Self-pay

## 2020-08-29 ENCOUNTER — Other Ambulatory Visit: Payer: Self-pay | Admitting: *Deleted

## 2020-08-29 ENCOUNTER — Encounter: Payer: Self-pay | Admitting: *Deleted

## 2020-08-29 ENCOUNTER — Ambulatory Visit (INDEPENDENT_AMBULATORY_CARE_PROVIDER_SITE_OTHER): Payer: Medicare Other | Admitting: Physician Assistant

## 2020-08-29 VITALS — BP 126/55 | HR 101 | Temp 97.3°F | Resp 16 | Ht 65.0 in | Wt 150.0 lb

## 2020-08-29 DIAGNOSIS — N186 End stage renal disease: Secondary | ICD-10-CM

## 2020-08-29 DIAGNOSIS — Z992 Dependence on renal dialysis: Secondary | ICD-10-CM | POA: Diagnosis not present

## 2020-08-29 DIAGNOSIS — J3089 Other allergic rhinitis: Secondary | ICD-10-CM | POA: Diagnosis not present

## 2020-08-29 NOTE — H&P (View-Only) (Signed)
[facility] [place] HISTORY AND PHYSICAL   History of Present Illness:  Patient is a 70 y.o. year old female who presents for placement of a permanent hemodialysis access.  She states she has had difficulty with her access and it is getting progressively  harder and more painful.    Past Medical History:  Diagnosis Date  . Abdominal bruit   . Anemia   . Anxiety   . Arthritis    Osteoarthritis  . Asthma   . Cervical disc disease    "pinced nerve"  . CHF (congestive heart failure) ([place])   . Complication of anesthesia    " after I got home from my last procedure, I started itching."  . Diabetes mellitus    Type II  . Diverticulitis   . ESRD (end stage renal disease) ([place])    dialysis - M/W/F- [place]  . GERD (gastroesophageal reflux disease)    from medications  . GI bleed 03/31/2013  . Head injury 07/2017  . History of hiatal hernia   . Hyperlipidemia   . Hypertension   . Neuropathy    left leg  . Osteoporosis   . Peripheral vascular disease ([place])   . Pneumonia    "very young" and a few years ago  . PONV (postoperative nausea and vomiting)   . Seasonal allergies   . Shortness of breath dyspnea    WIth exertion, when fluid builds  . Sleep apnea    can't afford cpap     Past Surgical History:  Procedure Laterality Date  . A/V SHUNTOGRAM N/A 09/22/2016   Procedure: A/V Shuntogram - left arm;  Surgeon: Serafina Mitchell, MD;  Location: [facility];  Service: Cardiovascular;  Laterality: N/A;  . A/V SHUNTOGRAM N/A 03/22/2018   Procedure: A/V SHUNTOGRAM - left arm;  Surgeon: Serafina Mitchell, MD;  Location: [facility];  Service: Cardiovascular;  Laterality: N/A;  . A/V SHUNTOGRAM Left 11/17/2018   Procedure: A/V SHUNTOGRAM;  Surgeon: Angelia Mould, MD;  Location: [facility];  Service: Cardiovascular;  Laterality: Left;  . ABDOMINAL HYSTERECTOMY  1993`  . AMPUTATION Left 09/01/2016   Procedure: LEFT FOOT TRANSMETATARSAL  AMPUTATION;  Surgeon: Newt Minion, MD;  Location: [facility];  Service: Orthopedics;  Laterality: Left;  . AMPUTATION Right 08/11/2017   Procedure: RIGHT GREAT TOE AMPUTATION DIGIT;  Surgeon: Rosetta Posner, MD;  Location: [facility];  Service: Vascular;  Laterality: Right;  . AMPUTATION Right 12/31/2017   Procedure: RIGHT TRANSMETATARSAL AMPUTATION;  Surgeon: Newt Minion, MD;  Location: [facility];  Service: Orthopedics;  Laterality: Right;  . AV FISTULA PLACEMENT Left 04/21/2016   Procedure: INSERTION OF ARTERIOVENOUS (AV) GORE-TEX GRAFT ARM LEFT;  Surgeon: Elam Dutch, MD;  Location: [facility];  Service: Vascular;  Laterality: Left;  . [place] TRANSPOSITION Left 07/10/2014   Procedure: BASCILIC VEIN TRANSPOSITION;  Surgeon: Angelia Mould, MD;  Location: [facility];  Service: Vascular;  Laterality: Left;  . BASCILIC VEIN TRANSPOSITION Right 11/08/2014   Procedure: FIRST STAGE BASILIC VEIN TRANSPOSITION;  Surgeon: Angelia Mould, MD;  Location: [facility];  Service: Vascular;  Laterality: Right;  . BASCILIC VEIN TRANSPOSITION Right 01/18/2015   Procedure: SECOND STAGE BASILIC VEIN TRANSPOSITION;  Surgeon: Angelia Mould, MD;  Location: [facility];  Service: Vascular;  Laterality: Right;  . CRANIOTOMY N/A 08/23/2017   Procedure: CRANIOTOMY HEMATOMA EVACUATION SUBDURAL;  Surgeon: Ashok Pall, MD;  Location: [facility];  Service:  Neurosurgery;  Laterality: N/A;  . CRANIOTOMY Left 08/24/2017   Procedure: CRANIOTOMY FOR RECURRENT ACUTE SUBDURAL HEMATOMA;  Surgeon: Ashok Pall, MD;  Location: [facility];  Service: Neurosurgery;  Laterality: Left;  . ESOPHAGOGASTRODUODENOSCOPY N/A 03/31/2013   Procedure: ESOPHAGOGASTRODUODENOSCOPY (EGD);  Surgeon: Gatha Mayer, MD;  Location: [facility] ENDOS[facility];  Service: Endoscopy;  Laterality: N/A;  . EYE SURGERY     laser surgery  . FEMORAL-POPLITEAL BYPASS GRAFT Left 07/08/2016   Procedure: LEFT  FEMORAL-BELOW KNEE POPLITEAL ARTERY BYPASS GRAFT USING 6MM X 80 CM PROPATEN GORETEX  GRAFT WITH RINGS.;  Surgeon: Rosetta Posner, MD;  Location: [facility];  Service: Vascular;  Laterality: Left;  . FEMORAL-POPLITEAL BYPASS GRAFT Right 08/11/2017   Procedure: RIGHT FEMORAL TO BELOW KNEE POPLITEAL ARTERKY  BYPASS GRAFT USING 6MM RINGED PROPATEN GRAFT;  Surgeon: Rosetta Posner, MD;  Location: [place];  Service: Vascular;  Laterality: Right;  . FISTULOGRAM Left 10/29/2014   Procedure: FISTULOGRAM;  Surgeon: Angelia Mould, MD;  Location: [facility] LAB;  Service: Cardiovascular;  Laterality: Left;  Marland Kitchen GAS/FLUID EXCHANGE Left 12/20/2018   Procedure: AIR GAS EXCHANGE;  Surgeon: Jalene Mullet, MD;  Location: [place];  Service: Ophthalmology;  Laterality: Left;  . KNEE ARTHROSCOPY Right 05/06/2018   Procedure: RIGHT KNEE ARTHROSCOPY;  Surgeon: Newt Minion, MD;  Location: [place] [facility];  Service: Orthopedics;  Laterality: Right;  . KNEE ARTHROSCOPY Right 06/10/2018   Procedure: RIGHT KNEE ARTHROSCOPY AND DEBRIDEMENT;  Surgeon: Newt Minion, MD;  Location: [facility];  Service: Orthopedics;  Laterality: Right;  . LIGATION OF ARTERIOVENOUS  FISTULA Left 04/21/2016   Procedure: LIGATION OF ARTERIOVENOUS  FISTULA LEFT ARM;  Surgeon: Elam Dutch, MD;  Location: [facility] [facility];  Service: Vascular;  Laterality: Left;  . LOWER EXTREMITY ANGIOGRAPHY N/A 04/06/2017   Procedure: Lower Extremity Angiography - Right;  Surgeon: Serafina Mitchell, MD;  Location: [facility];  Service: Cardiovascular;  Laterality: N/A;  . MEMBRANE PEEL Left 12/20/2018   Procedure: MEMBRANE PEEL;  Surgeon: Jalene Mullet, MD;  Location: [facility];  Service: Ophthalmology;  Laterality: Left;  . PATCH ANGIOPLASTY Right 01/18/2015   Procedure: BASILIC VEIN PATCH ANGIOPLASTY USING VASCUGUARD PATCH;  Surgeon: Angelia Mould, MD;  Location: [facility] [place];  Service: Vascular;  Laterality: Right;  . PERIPHERAL VASCULAR BALLOON ANGIOPLASTY  09/22/2016   Procedure: Peripheral Vascular Balloon Angioplasty;  Surgeon: Serafina Mitchell, MD;  Location: [facility];  Service: Cardiovascular;;  Lt. Fistula  . PERIPHERAL VASCULAR BALLOON ANGIOPLASTY Left 03/22/2018   Procedure: PERIPHERAL VASCULAR BALLOON ANGIOPLASTY;  Surgeon: Serafina Mitchell, MD;  Location: [place] [facility];  Service: Cardiovascular;  Laterality: Left;  Arm shunt  . PERIPHERAL VASCULAR CATHETERIZATION N/A 06/23/2016   Procedure: Abdominal Aortogram w/Lower Extremity;  Surgeon: Serafina Mitchell, MD;  Location: [facility];  Service: Cardiovascular;  Laterality: N/A;  . PERIPHERAL VASCULAR CATHETERIZATION  06/23/2016   Procedure: Peripheral Vascular Intervention;  Surgeon: Serafina Mitchell, MD;  Location: [facility];  Service: Cardiovascular;;  lt common and external illiac artery  . PHOTOCOAGULATION WITH LASER Left 12/20/2018   Procedure: PHOTOCOAGULATION WITH LASER;  Surgeon: Jalene Mullet, MD;  Location: [place];  Service: Ophthalmology;  Laterality: Left;  . 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 Smoking status: Former Smoker    Packs/day: 0.35    Years: 40.00    Pack years: 14.00    Types: Cigarettes    Quit date: 05/10/2012    Years since quitting: 8.3  . Smokeless tobacco: Never Used  Vaping Use  . Vaping Use: Never used  Substance Use Topics  . Alcohol use: No  . Drug use: No    Comment: marijuana; quit in early 1980's    Family History Family History  Problem Relation Age of Onset  . Other Mother        not sure of cause of death  . Diabetes Father   . Pancreatic cancer Maternal Grandmother   . Diabetes Sister   . Diabetes Sister   . Colon cancer Neg Hx     Allergies  Allergies  Allergen Reactions  . Adhesive  [Tape] Hives  . Lisinopril Cough  . Prednisone Swelling and Other (See Comments)     Excessive fluid buildup     Current Outpatient Medications  Medication Sig Dispense Refill  . albuterol (PROAIR HFA) 108 (90 Base) MCG/ACT inhaler 4 puffs every 4-6 hours as needed 18 g 1  . amLODipine (NORVASC) 10 MG tablet Take 10 mg by mouth at bedtime.    Marland Kitchen aspirin EC 81 MG tablet Take 81 mg by mouth daily.    Marland Kitchen atorvastatin (LIPITOR) 40 MG tablet Take 1 tablet (40 mg total) by mouth daily. 90 tablet 3  . azelastine (ASTELIN) 0.1 % nasal spray Place 1 spray into both nostrils 2 (two) times daily. 30 mL 5  . benzonatate (TESSALON PERLES) 100 MG capsule Take 1 capsule (100 mg total) by mouth 3 (three) times daily as needed for cough. 20 capsule 0  . BIDIL 20-37.5 MG tablet TAKE ONE TABLET BY MOUTH THREE TIMES A DAY  90 tablet 1  . busPIRone (BUSPAR) 7.5 MG tablet 7.5 mg.     . carvedilol (COREG) 12.5 MG tablet Take 12.5 mg by mouth See admin instructions. Take 1 tablet (12.5 mg) by mouth once daily in the evening on Mondays, Wednesdays, & Fridays. (Dialysis) Take 1 tablet (12.5 mg) by mouth twice daily on Sundays, Tuesdays, Thursdays, & Saturdays. (Non Dialysis)    . cinacalcet (SENSIPAR) 60 MG tablet Take 90 mg by mouth See admin instructions. Take 1 tablet (90 mg) by mouth in the evening on Mondays, Wednesdays, & Fridays (dialysis) Take 1 tablet (90 mg) by mouth twice daily on Sundays, Tuesdays, Thursdays, Saturdays. (non dialysis)    . diazepam (VALIUM) 5 MG tablet Take 5 mg by mouth 2 (two) times daily as needed for anxiety.    . docusate sodium (COLACE) 100 MG capsule Take 1 capsule (100 mg total) by mouth 2 (two) times daily. 30 capsule 0  . ferric citrate (AURYXIA) 1 GM 210 MG(Fe) tablet Take 210-420 mg by mouth See admin instructions. Take 2 tablets (420 mg) by mouth with each meal & take 1 tablet (210 mg) by mouth with snacks.    . fluticasone (FLONASE) 50 MCG/ACT nasal spray Place 2 sprays into both nostrils daily as needed for allergies or rhinitis. 16 g 5  . gabapentin (NEURONTIN)  100 MG capsule TAKE 1 CAPSULE (100 MG TOTAL) BY MOUTH AT BEDTIME. WHEN NECESSARY FOR NEUROPATHY PAIN 30 capsule 3  . glipiZIDE (GLUCOTROL) 5 MG tablet Take 5 mg by mouth daily before breakfast.    . guaiFENesin-codeine (ROBITUSSIN AC) 100-10 MG/5ML syrup Take by mouth.    Marland Kitchen  lidocaine-prilocaine (EMLA) cream Apply 1 application topically 3 (three) times a week. 1-2 hours prior to dialysis  12  . LOPERAMIDE HCL PO Take by mouth.    . montelukast (SINGULAIR) 10 MG tablet Take 1 tablet (10 mg total) by mouth at bedtime. 30 tablet 0  . multivitamin (RENA-VIT) TABS tablet Take 1 tablet by mouth daily.   5  . omeprazole (PRILOSEC) 20 MG capsule Take 20 mg by mouth daily.     . ondansetron (ZOFRAN) 4 MG tablet Take 1 tablet (4 mg total) by mouth every 6 (six) hours as needed for nausea. 20 tablet 0  . polyethylene glycol (MIRALAX / GLYCOLAX) 17 g packet Take 17 g by mouth daily. 14 each 0  . traZODone (DESYREL) 50 MG tablet     . Vitamin D, Ergocalciferol, (DRISDOL) 1.25 MG (50000 UT) CAPS capsule Take 50,000 Units by mouth every Sunday. Takes on Sunday    . XYZAL ALLERGY 24HR 5 MG tablet Take 5 mg by mouth daily.    . budesonide-formoterol (SYMBICORT) 160-4.5 MCG/ACT inhaler 2 puffs twice daily with spacer (Patient not taking: No sig reported) 10.2 g 5  . EPINEPHrine 0.3 mg/0.3 mL IJ SOAJ injection Inject 0.3 mLs (0.3 mg total) into the muscle as needed for anaphylaxis. (Patient not taking: Reported on 08/29/2020) 2 each 1   No current facility-administered medications for this visit.    ROS:   General:  No weight loss, Fever, chills  HEENT: No recent headaches, no nasal bleeding, no visual changes, no sore throat  Neurologic: No dizziness, blackouts, seizures. No recent symptoms of stroke or mini- stroke. No recent episodes of slurred speech, or temporary blindness.  Cardiac: No recent episodes of chest pain/pressure, no shortness of breath at rest.  No shortness of breath with exertion.  Denies  history of atrial fibrillation or irregular heartbeat  Vascular: No history of rest pain in feet.  No history of claudication.  No history of non-healing ulcer, No history of DVT   Pulmonary: No home oxygen, no productive cough, no hemoptysis,  No asthma or wheezing  Musculoskeletal:  [ ]  Arthritis, [ ]  Low back pain,  [ ]  Joint pain  Hematologic:No history of hypercoagulable state.  No history of easy bleeding.  No history of anemia  Gastrointestinal: No hematochezia or melena,  No gastroesophageal reflux, no trouble swallowing  Urinary: [ ]  chronic Kidney disease, [x ] on HD - [x ] MWF or [ ]  TTHS, [ ]  Burning with urination, [ ]  Frequent urination, [ ]  Difficulty urinating;   Skin: No rashes  Psychological: No history of anxiety,  No history of depression   Physical Examination  Vitals:   08/29/20 0930  BP: (!) 126/55  Pulse: (!) 101  Resp: 16  Temp: (!) 97.3 F (36.3 C)  TempSrc: Temporal  SpO2: 92%  Weight: 150 lb (68 kg)  Height: 5\' 5"  (1.651 m)    Body mass index is 24.96 kg/m.  General:  Alert and oriented, no acute distress HEENT: Normal Neck: No bruit or JVD Pulmonary: Clear to auscultation bilaterally Cardiac: Regular Rate and Rhythm without murmur Gastrointestinal: Soft, non-tender, non-distended, no mass, no scars Skin: No rash Extremity Pulses:  2+ radial, brachial pulses bilaterally,  Musculoskeletal: No deformity or edema  Neurologic: Upper and lower extremity motor 5/5 and symmetric  DATA:      +--------------------+----------+-----------------+------------------+  AVG         PSV (cm/s)Flow Vol (mL/min)   Describe     +--------------------+----------+-----------------+------------------+  Native artery inflow  284      962    Plaque visualized   +--------------------+----------+-----------------+------------------+  Arterial anastomosis  370                       +--------------------+----------+-----------------+------------------+  Prox graft       372           change in diameter  +--------------------+----------+-----------------+------------------+  Mid graft        557              stenotic     +--------------------+----------+-----------------+------------------+  Distal graft      503              stenotic     +--------------------+----------+-----------------+------------------+  Venous anastomosis   230                      +--------------------+----------+-----------------+------------------+  Venous outflow     87                      +--------------------+----------+-----------------+------------------+   Summary:  Patent left AV graft. Arteriovenous graft-Stenosis noted in the mid and  distal  graft. Narrowing noted in the proximal graft.   ASSESSMENT: ESRD with left UE AV graft malfunctioning. She has significant pain with HD sticks and multiple people try to get access.  She has a history of prolonged bleeding after HD.  The duplex shows mid graft stenosis.  I told her we have 2 choices either repeat the fistulogram or revise verse replace the graft.  She wants to revise or replace the graft and she is not concerned with having a TDC placed.      PLAN: I have scheduled her for left UE AV graft revision verses replacement and TDC placement.  She has HD MWF and is not on anticoagulation.  Roxy Horseman PA-C [facility] [place] Office: [phone]  MD on call Stanford Breed

## 2020-08-29 NOTE — Progress Notes (Signed)
[facility] [place] HISTORY AND PHYSICAL   History of Present Illness:  Patient is a 70 y.o. year old female who presents for placement of a permanent hemodialysis access.  She states she has had difficulty with her access and it is getting progressively  harder and more painful.    Past Medical History:  Diagnosis Date  . Abdominal bruit   . Anemia   . Anxiety   . Arthritis    Osteoarthritis  . Asthma   . Cervical disc disease    "pinced nerve"  . CHF (congestive heart failure) ([place])   . Complication of anesthesia    " after I got home from my last procedure, I started itching."  . Diabetes mellitus    Type II  . Diverticulitis   . ESRD (end stage renal disease) ([place])    dialysis - M/W/F- [place]  . GERD (gastroesophageal reflux disease)    from medications  . GI bleed 03/31/2013  . Head injury 07/2017  . History of hiatal hernia   . Hyperlipidemia   . Hypertension   . Neuropathy    left leg  . Osteoporosis   . Peripheral vascular disease ([place])   . Pneumonia    "very young" and a few years ago  . PONV (postoperative nausea and vomiting)   . Seasonal allergies   . Shortness of breath dyspnea    WIth exertion, when fluid builds  . Sleep apnea    can't afford cpap     Past Surgical History:  Procedure Laterality Date  . A/V SHUNTOGRAM N/A 09/22/2016   Procedure: A/V Shuntogram - left arm;  Surgeon: Serafina Mitchell, MD;  Location: [place] [facility];  Service: Cardiovascular;  Laterality: N/A;  . A/V SHUNTOGRAM N/A 03/22/2018   Procedure: A/V SHUNTOGRAM - left arm;  Surgeon: Serafina Mitchell, MD;  Location: [place] [facility];  Service: Cardiovascular;  Laterality: N/A;  . A/V SHUNTOGRAM Left 11/17/2018   Procedure: A/V SHUNTOGRAM;  Surgeon: Angelia Mould, MD;  Location: [place] [facility];  Service: Cardiovascular;  Laterality: Left;  . ABDOMINAL HYSTERECTOMY  1993`  . AMPUTATION Left 09/01/2016   Procedure: LEFT FOOT TRANSMETATARSAL  AMPUTATION;  Surgeon: Newt Minion, MD;  Location: [place];  Service: Orthopedics;  Laterality: Left;  . AMPUTATION Right 08/11/2017   Procedure: RIGHT GREAT TOE AMPUTATION DIGIT;  Surgeon: Rosetta Posner, MD;  Location: [place];  Service: Vascular;  Laterality: Right;  . AMPUTATION Right 12/31/2017   Procedure: RIGHT TRANSMETATARSAL AMPUTATION;  Surgeon: Newt Minion, MD;  Location: [place];  Service: Orthopedics;  Laterality: Right;  . AV FISTULA PLACEMENT Left 04/21/2016   Procedure: INSERTION OF ARTERIOVENOUS (AV) GORE-TEX GRAFT ARM LEFT;  Surgeon: Elam Dutch, MD;  Location: [place];  Service: Vascular;  Laterality: Left;  . [place] TRANSPOSITION Left 07/10/2014   Procedure: BASCILIC VEIN TRANSPOSITION;  Surgeon: Angelia Mould, MD;  Location: [place];  Service: Vascular;  Laterality: Left;  . BASCILIC VEIN TRANSPOSITION Right 11/08/2014   Procedure: FIRST STAGE BASILIC VEIN TRANSPOSITION;  Surgeon: Angelia Mould, MD;  Location: [place];  Service: Vascular;  Laterality: Right;  . BASCILIC VEIN TRANSPOSITION Right 01/18/2015   Procedure: SECOND STAGE BASILIC VEIN TRANSPOSITION;  Surgeon: Angelia Mould, MD;  Location: [place];  Service: Vascular;  Laterality: Right;  . CRANIOTOMY N/A 08/23/2017   Procedure: CRANIOTOMY HEMATOMA EVACUATION SUBDURAL;  Surgeon: Ashok Pall, MD;  Location: [place];  Service:  Neurosurgery;  Laterality: N/A;  . CRANIOTOMY Left 08/24/2017   Procedure: CRANIOTOMY FOR RECURRENT ACUTE SUBDURAL HEMATOMA;  Surgeon: Ashok Pall, MD;  Location: [place];  Service: Neurosurgery;  Laterality: Left;  . ESOPHAGOGASTRODUODENOSCOPY N/A 03/31/2013   Procedure: ESOPHAGOGASTRODUODENOSCOPY (EGD);  Surgeon: Gatha Mayer, MD;  Location: [facility] ENDOS[facility];  Service: Endoscopy;  Laterality: N/A;  . EYE SURGERY     laser surgery  . FEMORAL-POPLITEAL BYPASS GRAFT Left 07/08/2016   Procedure: LEFT  FEMORAL-BELOW KNEE POPLITEAL ARTERY BYPASS GRAFT USING 6MM X 80 CM PROPATEN GORETEX  GRAFT WITH RINGS.;  Surgeon: Rosetta Posner, MD;  Location: [place];  Service: Vascular;  Laterality: Left;  . FEMORAL-POPLITEAL BYPASS GRAFT Right 08/11/2017   Procedure: RIGHT FEMORAL TO BELOW KNEE POPLITEAL ARTERKY  BYPASS GRAFT USING 6MM RINGED PROPATEN GRAFT;  Surgeon: Rosetta Posner, MD;  Location: [place];  Service: Vascular;  Laterality: Right;  . FISTULOGRAM Left 10/29/2014   Procedure: FISTULOGRAM;  Surgeon: Angelia Mould, MD;  Location: [facility] CATH LAB;  Service: Cardiovascular;  Laterality: Left;  Marland Kitchen GAS/FLUID EXCHANGE Left 12/20/2018   Procedure: AIR GAS EXCHANGE;  Surgeon: Jalene Mullet, MD;  Location: [place];  Service: Ophthalmology;  Laterality: Left;  . KNEE ARTHROSCOPY Right 05/06/2018   Procedure: RIGHT KNEE ARTHROSCOPY;  Surgeon: Newt Minion, MD;  Location: [place];  Service: Orthopedics;  Laterality: Right;  . KNEE ARTHROSCOPY Right 06/10/2018   Procedure: RIGHT KNEE ARTHROSCOPY AND DEBRIDEMENT;  Surgeon: Newt Minion, MD;  Location: [place];  Service: Orthopedics;  Laterality: Right;  . LIGATION OF ARTERIOVENOUS  FISTULA Left 04/21/2016   Procedure: LIGATION OF ARTERIOVENOUS  FISTULA LEFT ARM;  Surgeon: Elam Dutch, MD;  Location: [place];  Service: Vascular;  Laterality: Left;  . LOWER EXTREMITY ANGIOGRAPHY N/A 04/06/2017   Procedure: Lower Extremity Angiography - Right;  Surgeon: Serafina Mitchell, MD;  Location: [place] [facility];  Service: Cardiovascular;  Laterality: N/A;  . MEMBRANE PEEL Left 12/20/2018   Procedure: MEMBRANE PEEL;  Surgeon: Jalene Mullet, MD;  Location: [place];  Service: Ophthalmology;  Laterality: Left;  . PATCH ANGIOPLASTY Right 01/18/2015   Procedure: BASILIC VEIN PATCH ANGIOPLASTY USING VASCUGUARD PATCH;  Surgeon: Angelia Mould, MD;  Location: [place];  Service: Vascular;  Laterality: Right;  . PERIPHERAL VASCULAR BALLOON ANGIOPLASTY  09/22/2016   Procedure: Peripheral Vascular Balloon Angioplasty;  Surgeon: Serafina Mitchell, MD;  Location: [place] [facility];  Service: Cardiovascular;;  Lt. Fistula  . PERIPHERAL VASCULAR BALLOON ANGIOPLASTY Left 03/22/2018   Procedure: PERIPHERAL VASCULAR BALLOON ANGIOPLASTY;  Surgeon: Serafina Mitchell, MD;  Location: [place] [facility];  Service: Cardiovascular;  Laterality: Left;  Arm shunt  . PERIPHERAL VASCULAR CATHETERIZATION N/A 06/23/2016   Procedure: Abdominal Aortogram w/Lower Extremity;  Surgeon: Serafina Mitchell, MD;  Location: [place] [facility];  Service: Cardiovascular;  Laterality: N/A;  . PERIPHERAL VASCULAR CATHETERIZATION  06/23/2016   Procedure: Peripheral Vascular Intervention;  Surgeon: Serafina Mitchell, MD;  Location: [place] [facility];  Service: Cardiovascular;;  lt common and external illiac artery  . PHOTOCOAGULATION WITH LASER Left 12/20/2018   Procedure: PHOTOCOAGULATION WITH LASER;  Surgeon: Jalene Mullet, MD;  Location: [place];  Service: Ophthalmology;  Laterality: Left;  . 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 Smoking status: Former Smoker    Packs/day: 0.35    Years: 40.00    Pack years: 14.00    Types: Cigarettes    Quit date: 05/10/2012    Years since quitting: 8.3  . Smokeless tobacco: Never Used  Vaping Use  . Vaping Use: Never used  Substance Use Topics  . Alcohol use: No  . Drug use: No    Comment: marijuana; quit in early 1980's    Family History Family History  Problem Relation Age of Onset  . Other Mother        not sure of cause of death  . Diabetes Father   . Pancreatic cancer Maternal Grandmother   . Diabetes Sister   . Diabetes Sister   . Colon cancer Neg Hx     Allergies  Allergies  Allergen Reactions  . Adhesive  [Tape] Hives  . Lisinopril Cough  . Prednisone Swelling and Other (See Comments)     Excessive fluid buildup     Current Outpatient Medications  Medication Sig Dispense Refill  . albuterol (PROAIR HFA) 108 (90 Base) MCG/ACT inhaler 4 puffs every 4-6 hours as needed 18 g 1  . amLODipine (NORVASC) 10 MG tablet Take 10 mg by mouth at bedtime.    Marland Kitchen aspirin EC 81 MG tablet Take 81 mg by mouth daily.    Marland Kitchen atorvastatin (LIPITOR) 40 MG tablet Take 1 tablet (40 mg total) by mouth daily. 90 tablet 3  . azelastine (ASTELIN) 0.1 % nasal spray Place 1 spray into both nostrils 2 (two) times daily. 30 mL 5  . benzonatate (TESSALON PERLES) 100 MG capsule Take 1 capsule (100 mg total) by mouth 3 (three) times daily as needed for cough. 20 capsule 0  . BIDIL 20-37.5 MG tablet TAKE ONE TABLET BY MOUTH THREE TIMES A DAY  90 tablet 1  . busPIRone (BUSPAR) 7.5 MG tablet 7.5 mg.     . carvedilol (COREG) 12.5 MG tablet Take 12.5 mg by mouth See admin instructions. Take 1 tablet (12.5 mg) by mouth once daily in the evening on Mondays, Wednesdays, & Fridays. (Dialysis) Take 1 tablet (12.5 mg) by mouth twice daily on Sundays, Tuesdays, Thursdays, & Saturdays. (Non Dialysis)    . cinacalcet (SENSIPAR) 60 MG tablet Take 90 mg by mouth See admin instructions. Take 1 tablet (90 mg) by mouth in the evening on Mondays, Wednesdays, & Fridays (dialysis) Take 1 tablet (90 mg) by mouth twice daily on Sundays, Tuesdays, Thursdays, Saturdays. (non dialysis)    . diazepam (VALIUM) 5 MG tablet Take 5 mg by mouth 2 (two) times daily as needed for anxiety.    . docusate sodium (COLACE) 100 MG capsule Take 1 capsule (100 mg total) by mouth 2 (two) times daily. 30 capsule 0  . ferric citrate (AURYXIA) 1 GM 210 MG(Fe) tablet Take 210-420 mg by mouth See admin instructions. Take 2 tablets (420 mg) by mouth with each meal & take 1 tablet (210 mg) by mouth with snacks.    . fluticasone (FLONASE) 50 MCG/ACT nasal spray Place 2 sprays into both nostrils daily as needed for allergies or rhinitis. 16 g 5  . gabapentin (NEURONTIN)  100 MG capsule TAKE 1 CAPSULE (100 MG TOTAL) BY MOUTH AT BEDTIME. WHEN NECESSARY FOR NEUROPATHY PAIN 30 capsule 3  . glipiZIDE (GLUCOTROL) 5 MG tablet Take 5 mg by mouth daily before breakfast.    . guaiFENesin-codeine (ROBITUSSIN AC) 100-10 MG/5ML syrup Take by mouth.    Marland Kitchen  lidocaine-prilocaine (EMLA) cream Apply 1 application topically 3 (three) times a week. 1-2 hours prior to dialysis  12  . LOPERAMIDE HCL PO Take by mouth.    . montelukast (SINGULAIR) 10 MG tablet Take 1 tablet (10 mg total) by mouth at bedtime. 30 tablet 0  . multivitamin (RENA-VIT) TABS tablet Take 1 tablet by mouth daily.   5  . omeprazole (PRILOSEC) 20 MG capsule Take 20 mg by mouth daily.     . ondansetron (ZOFRAN) 4 MG tablet Take 1 tablet (4 mg total) by mouth every 6 (six) hours as needed for nausea. 20 tablet 0  . polyethylene glycol (MIRALAX / GLYCOLAX) 17 g packet Take 17 g by mouth daily. 14 each 0  . traZODone (DESYREL) 50 MG tablet     . Vitamin D, Ergocalciferol, (DRISDOL) 1.25 MG (50000 UT) CAPS capsule Take 50,000 Units by mouth every Sunday. Takes on Sunday    . XYZAL ALLERGY 24HR 5 MG tablet Take 5 mg by mouth daily.    . budesonide-formoterol (SYMBICORT) 160-4.5 MCG/ACT inhaler 2 puffs twice daily with spacer (Patient not taking: No sig reported) 10.2 g 5  . EPINEPHrine 0.3 mg/0.3 mL IJ SOAJ injection Inject 0.3 mLs (0.3 mg total) into the muscle as needed for anaphylaxis. (Patient not taking: Reported on 08/29/2020) 2 each 1   No current facility-administered medications for this visit.    ROS:   General:  No weight loss, Fever, chills  HEENT: No recent headaches, no nasal bleeding, no visual changes, no sore throat  Neurologic: No dizziness, blackouts, seizures. No recent symptoms of stroke or mini- stroke. No recent episodes of slurred speech, or temporary blindness.  Cardiac: No recent episodes of chest pain/pressure, no shortness of breath at rest.  No shortness of breath with exertion.  Denies  history of atrial fibrillation or irregular heartbeat  Vascular: No history of rest pain in feet.  No history of claudication.  No history of non-healing ulcer, No history of DVT   Pulmonary: No home oxygen, no productive cough, no hemoptysis,  No asthma or wheezing  Musculoskeletal:  [ ]  Arthritis, [ ]  Low back pain,  [ ]  Joint pain  Hematologic:No history of hypercoagulable state.  No history of easy bleeding.  No history of anemia  Gastrointestinal: No hematochezia or melena,  No gastroesophageal reflux, no trouble swallowing  Urinary: [ ]  chronic Kidney disease, [x ] on HD - [x ] MWF or [ ]  TTHS, [ ]  Burning with urination, [ ]  Frequent urination, [ ]  Difficulty urinating;   Skin: No rashes  Psychological: No history of anxiety,  No history of depression   Physical Examination  Vitals:   08/29/20 0930  BP: (!) 126/55  Pulse: (!) 101  Resp: 16  Temp: (!) 97.3 F (36.3 C)  TempSrc: Temporal  SpO2: 92%  Weight: 150 lb (68 kg)  Height: 5\' 5"  (1.651 m)    Body mass index is 24.96 kg/m.  General:  Alert and oriented, no acute distress HEENT: Normal Neck: No bruit or JVD Pulmonary: Clear to auscultation bilaterally Cardiac: Regular Rate and Rhythm without murmur Gastrointestinal: Soft, non-tender, non-distended, no mass, no scars Skin: No rash Extremity Pulses:  2+ radial, brachial pulses bilaterally,  Musculoskeletal: No deformity or edema  Neurologic: Upper and lower extremity motor 5/5 and symmetric  DATA:      +--------------------+----------+-----------------+------------------+  AVG         PSV (cm/s)Flow Vol (mL/min)   Describe     +--------------------+----------+-----------------+------------------+  Native artery inflow  284      962    Plaque visualized   +--------------------+----------+-----------------+------------------+  Arterial anastomosis  370                       +--------------------+----------+-----------------+------------------+  Prox graft       372           change in diameter  +--------------------+----------+-----------------+------------------+  Mid graft        557              stenotic     +--------------------+----------+-----------------+------------------+  Distal graft      503              stenotic     +--------------------+----------+-----------------+------------------+  Venous anastomosis   230                      +--------------------+----------+-----------------+------------------+  Venous outflow     87                      +--------------------+----------+-----------------+------------------+   Summary:  Patent left AV graft. Arteriovenous graft-Stenosis noted in the mid and  distal  graft. Narrowing noted in the proximal graft.   ASSESSMENT: ESRD with left UE AV graft malfunctioning. She has significant pain with HD sticks and multiple people try to get access.  She has a history of prolonged bleeding after HD.  The duplex shows mid graft stenosis.  I told her we have 2 choices either repeat the fistulogram or revise verse replace the graft.  She wants to revise or replace the graft and she is not concerned with having a TDC placed.      PLAN: I have scheduled her for left UE AV graft revision verses replacement and TDC placement.  She has HD MWF and is not on anticoagulation.  Roxy Horseman PA-C [facility] [place] Office: [phone]  MD on call Stanford Breed

## 2020-08-30 DIAGNOSIS — D689 Coagulation defect, unspecified: Secondary | ICD-10-CM | POA: Diagnosis not present

## 2020-08-30 DIAGNOSIS — N186 End stage renal disease: Secondary | ICD-10-CM | POA: Diagnosis not present

## 2020-08-30 DIAGNOSIS — N2581 Secondary hyperparathyroidism of renal origin: Secondary | ICD-10-CM | POA: Diagnosis not present

## 2020-08-30 DIAGNOSIS — Z992 Dependence on renal dialysis: Secondary | ICD-10-CM | POA: Diagnosis not present

## 2020-08-30 DIAGNOSIS — E876 Hypokalemia: Secondary | ICD-10-CM | POA: Diagnosis not present

## 2020-08-30 DIAGNOSIS — T782XXA Anaphylactic shock, unspecified, initial encounter: Secondary | ICD-10-CM | POA: Diagnosis not present

## 2020-09-02 DIAGNOSIS — D689 Coagulation defect, unspecified: Secondary | ICD-10-CM | POA: Diagnosis not present

## 2020-09-02 DIAGNOSIS — N186 End stage renal disease: Secondary | ICD-10-CM | POA: Diagnosis not present

## 2020-09-02 DIAGNOSIS — Z992 Dependence on renal dialysis: Secondary | ICD-10-CM | POA: Diagnosis not present

## 2020-09-02 DIAGNOSIS — N2581 Secondary hyperparathyroidism of renal origin: Secondary | ICD-10-CM | POA: Diagnosis not present

## 2020-09-02 DIAGNOSIS — T782XXA Anaphylactic shock, unspecified, initial encounter: Secondary | ICD-10-CM | POA: Diagnosis not present

## 2020-09-02 DIAGNOSIS — E876 Hypokalemia: Secondary | ICD-10-CM | POA: Diagnosis not present

## 2020-09-03 ENCOUNTER — Encounter (HOSPITAL_COMMUNITY): Payer: Self-pay | Admitting: *Deleted

## 2020-09-03 ENCOUNTER — Other Ambulatory Visit: Payer: Self-pay

## 2020-09-03 ENCOUNTER — Ambulatory Visit (INDEPENDENT_AMBULATORY_CARE_PROVIDER_SITE_OTHER): Payer: Medicare Other | Admitting: *Deleted

## 2020-09-03 ENCOUNTER — Other Ambulatory Visit (HOSPITAL_COMMUNITY)
Admission: RE | Admit: 2020-09-03 | Discharge: 2020-09-03 | Disposition: A | Discharge status: Home / Self Care | Payer: Medicare Other | Source: Ambulatory Visit | Attending: Vascular Surgery | Admitting: Vascular Surgery

## 2020-09-03 DIAGNOSIS — Z01812 Encounter for preprocedural laboratory examination: Secondary | ICD-10-CM | POA: Diagnosis not present

## 2020-09-03 DIAGNOSIS — J309 Allergic rhinitis, unspecified: Secondary | ICD-10-CM

## 2020-09-03 DIAGNOSIS — Z20822 Contact with and (suspected) exposure to covid-19: Secondary | ICD-10-CM | POA: Diagnosis not present

## 2020-09-03 DIAGNOSIS — E113513 Type 2 diabetes mellitus with proliferative diabetic retinopathy with macular edema, bilateral: Secondary | ICD-10-CM | POA: Diagnosis not present

## 2020-09-03 DIAGNOSIS — Z961 Presence of intraocular lens: Secondary | ICD-10-CM | POA: Diagnosis not present

## 2020-09-03 LAB — SARS CORONAVIRUS 2 (TAT 6-24 HRS): SARS Coronavirus 2: NEGATIVE

## 2020-09-03 NOTE — Progress Notes (Signed)
Ms Destiny Day denies chest pain or shortness of breath. Patient denies having any s/s of Covid in her household.  Patient denies any known exposure to Covid. Ms Destiny Day was tested for Covid today, results are pending.  Ms Destiny Day has type II diabetes. Patient does not have a CBG machine. I instructed Ms Destiny Day to not take any DM mediation in am.

## 2020-09-04 ENCOUNTER — Other Ambulatory Visit: Payer: Self-pay | Admitting: Internal Medicine

## 2020-09-04 DIAGNOSIS — E876 Hypokalemia: Secondary | ICD-10-CM | POA: Diagnosis not present

## 2020-09-04 DIAGNOSIS — Z992 Dependence on renal dialysis: Secondary | ICD-10-CM | POA: Diagnosis not present

## 2020-09-04 DIAGNOSIS — N2581 Secondary hyperparathyroidism of renal origin: Secondary | ICD-10-CM | POA: Diagnosis not present

## 2020-09-04 DIAGNOSIS — N186 End stage renal disease: Secondary | ICD-10-CM | POA: Diagnosis not present

## 2020-09-04 DIAGNOSIS — J301 Allergic rhinitis due to pollen: Secondary | ICD-10-CM

## 2020-09-04 DIAGNOSIS — D689 Coagulation defect, unspecified: Secondary | ICD-10-CM | POA: Diagnosis not present

## 2020-09-04 DIAGNOSIS — T782XXA Anaphylactic shock, unspecified, initial encounter: Secondary | ICD-10-CM | POA: Diagnosis not present

## 2020-09-05 ENCOUNTER — Ambulatory Visit (HOSPITAL_COMMUNITY): Payer: Medicare Other | Admitting: Certified Registered"

## 2020-09-05 ENCOUNTER — Ambulatory Visit (HOSPITAL_COMMUNITY)
Admission: RE | Admit: 2020-09-05 | Discharge: 2020-09-05 | Disposition: A | Discharge status: Home / Self Care | Payer: Medicare Other | Attending: Vascular Surgery | Admitting: Vascular Surgery

## 2020-09-05 ENCOUNTER — Encounter (HOSPITAL_COMMUNITY): Payer: Self-pay

## 2020-09-05 ENCOUNTER — Ambulatory Visit (HOSPITAL_COMMUNITY): Payer: Medicare Other

## 2020-09-05 ENCOUNTER — Encounter (HOSPITAL_COMMUNITY)
Admission: RE | Disposition: A | Discharge status: Home / Self Care | Payer: Self-pay | Source: Home / Self Care | Attending: Vascular Surgery

## 2020-09-05 DIAGNOSIS — E1122 Type 2 diabetes mellitus with diabetic chronic kidney disease: Secondary | ICD-10-CM | POA: Insufficient documentation

## 2020-09-05 DIAGNOSIS — Z452 Encounter for adjustment and management of vascular access device: Secondary | ICD-10-CM | POA: Diagnosis not present

## 2020-09-05 DIAGNOSIS — I509 Heart failure, unspecified: Secondary | ICD-10-CM | POA: Diagnosis not present

## 2020-09-05 DIAGNOSIS — Z79899 Other long term (current) drug therapy: Secondary | ICD-10-CM | POA: Insufficient documentation

## 2020-09-05 DIAGNOSIS — T82858A Stenosis of vascular prosthetic devices, implants and grafts, initial encounter: Secondary | ICD-10-CM | POA: Insufficient documentation

## 2020-09-05 DIAGNOSIS — Z95828 Presence of other vascular implants and grafts: Secondary | ICD-10-CM

## 2020-09-05 DIAGNOSIS — I132 Hypertensive heart and chronic kidney disease with heart failure and with stage 5 chronic kidney disease, or end stage renal disease: Secondary | ICD-10-CM | POA: Diagnosis not present

## 2020-09-05 DIAGNOSIS — I5033 Acute on chronic diastolic (congestive) heart failure: Secondary | ICD-10-CM | POA: Diagnosis not present

## 2020-09-05 DIAGNOSIS — I517 Cardiomegaly: Secondary | ICD-10-CM | POA: Diagnosis not present

## 2020-09-05 DIAGNOSIS — Z87891 Personal history of nicotine dependence: Secondary | ICD-10-CM | POA: Insufficient documentation

## 2020-09-05 DIAGNOSIS — Z7982 Long term (current) use of aspirin: Secondary | ICD-10-CM | POA: Insufficient documentation

## 2020-09-05 DIAGNOSIS — Y841 Kidney dialysis as the cause of abnormal reaction of the patient, or of later complication, without mention of misadventure at the time of the procedure: Secondary | ICD-10-CM | POA: Insufficient documentation

## 2020-09-05 DIAGNOSIS — N185 Chronic kidney disease, stage 5: Secondary | ICD-10-CM | POA: Diagnosis not present

## 2020-09-05 DIAGNOSIS — T82898A Other specified complication of vascular prosthetic devices, implants and grafts, initial encounter: Secondary | ICD-10-CM | POA: Diagnosis not present

## 2020-09-05 DIAGNOSIS — Z7984 Long term (current) use of oral hypoglycemic drugs: Secondary | ICD-10-CM | POA: Diagnosis not present

## 2020-09-05 DIAGNOSIS — N186 End stage renal disease: Secondary | ICD-10-CM | POA: Diagnosis not present

## 2020-09-05 DIAGNOSIS — I700 Atherosclerosis of aorta: Secondary | ICD-10-CM | POA: Diagnosis not present

## 2020-09-05 DIAGNOSIS — Z992 Dependence on renal dialysis: Secondary | ICD-10-CM | POA: Insufficient documentation

## 2020-09-05 HISTORY — PX: INSERTION OF DIALYSIS CATHETER: SHX1324

## 2020-09-05 HISTORY — PX: REVISION OF ARTERIOVENOUS GORETEX GRAFT: SHX6073

## 2020-09-05 LAB — POCT I-STAT, CHEM 8
BUN: 31 mg/dL — ABNORMAL HIGH (ref 8–23)
Calcium, Ion: 1.25 mmol/L (ref 1.15–1.40)
Chloride: 96 mmol/L — ABNORMAL LOW (ref 98–111)
Creatinine, Ser: 5.1 mg/dL — ABNORMAL HIGH (ref 0.44–1.00)
Glucose, Bld: 192 mg/dL — ABNORMAL HIGH (ref 70–99)
HCT: 38 % (ref 36.0–46.0)
Hemoglobin: 12.9 g/dL (ref 12.0–15.0)
Potassium: 3.9 mmol/L (ref 3.5–5.1)
Sodium: 135 mmol/L (ref 135–145)
TCO2: 27 mmol/L (ref 22–32)

## 2020-09-05 LAB — GLUCOSE, CAPILLARY
Glucose-Capillary: 172 mg/dL — ABNORMAL HIGH (ref 70–99)
Glucose-Capillary: 194 mg/dL — ABNORMAL HIGH (ref 70–99)

## 2020-09-05 SURGERY — REVISION OF ARTERIOVENOUS GORETEX GRAFT
Anesthesia: General | Site: Chest

## 2020-09-05 MED ORDER — ONDANSETRON HCL 4 MG/2ML IJ SOLN
INTRAMUSCULAR | Status: AC
  Filled 2020-09-05: qty 2

## 2020-09-05 MED ORDER — FENTANYL CITRATE (PF) 100 MCG/2ML IJ SOLN
INTRAMUSCULAR | Status: AC
  Filled 2020-09-05: qty 2

## 2020-09-05 MED ORDER — DEXAMETHASONE SODIUM PHOSPHATE 10 MG/ML IJ SOLN
INTRAMUSCULAR | Status: DC | PRN
  Administered 2020-09-05: 10 mg via INTRAVENOUS

## 2020-09-05 MED ORDER — SODIUM CHLORIDE 0.9 % IV SOLN
INTRAVENOUS | Status: DC | PRN
  Administered 2020-09-05: 13:00:00 500 mL

## 2020-09-05 MED ORDER — VASOPRESSIN 20 UNIT/ML IV SOLN
INTRAVENOUS | Status: AC
  Filled 2020-09-05: qty 1

## 2020-09-05 MED ORDER — HEPARIN SODIUM (PORCINE) 1000 UNIT/ML IJ SOLN
INTRAMUSCULAR | Status: DC | PRN
  Administered 2020-09-05: 5000 [IU] via INTRAVENOUS

## 2020-09-05 MED ORDER — OXYCODONE HCL 5 MG PO TABS
ORAL_TABLET | ORAL | Status: AC
  Filled 2020-09-05: qty 1

## 2020-09-05 MED ORDER — SODIUM CHLORIDE 0.9 % IV SOLN
INTRAVENOUS | Status: AC
  Filled 2020-09-05: qty 1.2

## 2020-09-05 MED ORDER — HEPARIN SODIUM (PORCINE) 1000 UNIT/ML IJ SOLN
INTRAMUSCULAR | Status: AC
  Filled 2020-09-05: qty 2

## 2020-09-05 MED ORDER — OXYCODONE HCL 5 MG/5ML PO SOLN: 5.0000 mg | Freq: Once | ORAL | Status: AC | PRN

## 2020-09-05 MED ORDER — HEMOSTATIC AGENTS (NO CHARGE) OPTIME
TOPICAL | Status: DC | PRN
  Administered 2020-09-05: 3 via TOPICAL

## 2020-09-05 MED ORDER — HEPARIN SODIUM (PORCINE) 1000 UNIT/ML IJ SOLN
INTRAMUSCULAR | Status: AC
  Filled 2020-09-05: qty 1

## 2020-09-05 MED ORDER — CHLORHEXIDINE GLUCONATE 0.12 % MT SOLN
OROMUCOSAL | Status: AC
  Filled 2020-09-05: qty 15

## 2020-09-05 MED ORDER — OXYCODONE-ACETAMINOPHEN 10-325 MG PO TABS: 1.0000 | ORAL_TABLET | Freq: Four times a day (QID) | ORAL | 0 refills | Status: DC | PRN

## 2020-09-05 MED ORDER — FENTANYL CITRATE (PF) 250 MCG/5ML IJ SOLN
INTRAMUSCULAR | Status: AC
  Filled 2020-09-05: qty 5

## 2020-09-05 MED ORDER — LIDOCAINE 2% (20 MG/ML) 5 ML SYRINGE
INTRAMUSCULAR | Status: DC | PRN
  Administered 2020-09-05: 20 mg via INTRAVENOUS
  Administered 2020-09-05: 40 mg via INTRAVENOUS

## 2020-09-05 MED ORDER — OXYCODONE HCL 5 MG PO TABS
5.0000 mg | ORAL_TABLET | Freq: Once | ORAL | Status: AC | PRN
  Administered 2020-09-05: 5 mg via ORAL

## 2020-09-05 MED ORDER — CEFAZOLIN SODIUM-DEXTROSE 2-4 GM/100ML-% IV SOLN
INTRAVENOUS | Status: AC
  Filled 2020-09-05: qty 100

## 2020-09-05 MED ORDER — STERILE WATER FOR IRRIGATION IR SOLN
Status: DC | PRN
  Administered 2020-09-05: 1000 mL

## 2020-09-05 MED ORDER — PROPOFOL 10 MG/ML IV BOLUS
INTRAVENOUS | Status: DC | PRN
  Administered 2020-09-05: 110 mg via INTRAVENOUS

## 2020-09-05 MED ORDER — FENTANYL CITRATE (PF) 250 MCG/5ML IJ SOLN
INTRAMUSCULAR | Status: DC | PRN
  Administered 2020-09-05 (×6): 25 ug via INTRAVENOUS

## 2020-09-05 MED ORDER — 0.9 % SODIUM CHLORIDE (POUR BTL) OPTIME
TOPICAL | Status: DC | PRN
  Administered 2020-09-05: 1000 mL

## 2020-09-05 MED ORDER — ONDANSETRON HCL 4 MG/2ML IJ SOLN
INTRAMUSCULAR | Status: DC | PRN
  Administered 2020-09-05: 4 mg via INTRAVENOUS

## 2020-09-05 MED ORDER — FENTANYL CITRATE (PF) 100 MCG/2ML IJ SOLN
25.0000 ug | INTRAMUSCULAR | Status: DC | PRN
  Administered 2020-09-05 (×2): 50 ug via INTRAVENOUS

## 2020-09-05 MED ORDER — SODIUM CHLORIDE 0.9 % IV SOLN: INTRAVENOUS | Status: DC

## 2020-09-05 MED ORDER — PHENYLEPHRINE HCL-NACL 10-0.9 MG/250ML-% IV SOLN
INTRAVENOUS | Status: DC | PRN
  Administered 2020-09-05: 75 ug/min via INTRAVENOUS

## 2020-09-05 MED ORDER — DEXAMETHASONE SODIUM PHOSPHATE 10 MG/ML IJ SOLN
INTRAMUSCULAR | Status: AC
  Filled 2020-09-05: qty 1

## 2020-09-05 MED ORDER — LIDOCAINE-EPINEPHRINE (PF) 1 %-1:200000 IJ SOLN
INTRAMUSCULAR | Status: AC
  Filled 2020-09-05: qty 30

## 2020-09-05 MED ORDER — CEFAZOLIN SODIUM-DEXTROSE 2-4 GM/100ML-% IV SOLN
2.0000 g | INTRAVENOUS | Status: AC
  Administered 2020-09-05: 2 g via INTRAVENOUS

## 2020-09-05 MED ORDER — ONDANSETRON HCL 4 MG/2ML IJ SOLN: 4.0000 mg | Freq: Once | INTRAMUSCULAR | Status: DC | PRN

## 2020-09-05 MED ORDER — LIDOCAINE 2% (20 MG/ML) 5 ML SYRINGE
INTRAMUSCULAR | Status: AC
  Filled 2020-09-05: qty 5

## 2020-09-05 MED ORDER — HEPARIN SODIUM (PORCINE) 1000 UNIT/ML IJ SOLN
INTRAMUSCULAR | Status: DC | PRN
  Administered 2020-09-05: 3800 [IU]

## 2020-09-05 MED ORDER — CHLORHEXIDINE GLUCONATE 4 % EX LIQD: 60.0000 mL | Freq: Once | CUTANEOUS | Status: DC

## 2020-09-05 MED ORDER — VASOPRESSIN 20 UNIT/ML IV SOLN
INTRAVENOUS | Status: DC | PRN
  Administered 2020-09-05 (×4): 2 [IU] via INTRAVENOUS

## 2020-09-05 MED ORDER — LIDOCAINE-EPINEPHRINE (PF) 1 %-1:200000 IJ SOLN
INTRAMUSCULAR | Status: DC | PRN
  Administered 2020-09-05: 10 mL

## 2020-09-05 SURGICAL SUPPLY — 40 items
ARMBAND PINK RESTRICT EXTREMIT (MISCELLANEOUS) ×6 IMPLANT
BENZOIN TINCTURE PRP APPL 2/3 (GAUZE/BANDAGES/DRESSINGS) ×3 IMPLANT
BIOPATCH RED 1 DISK 7.0 (GAUZE/BANDAGES/DRESSINGS) ×3 IMPLANT
CANISTER SUCT 3000ML PPV (MISCELLANEOUS) ×3 IMPLANT
CATH PALINDROME-P 23CM W/VT (CATHETERS) ×3 IMPLANT
CHLORAPREP W/TINT 26 (MISCELLANEOUS) ×3 IMPLANT
CLIP VESOCCLUDE MED 6/CT (CLIP) ×3 IMPLANT
CLIP VESOCCLUDE SM WIDE 6/CT (CLIP) ×3 IMPLANT
CLSR STERI-STRIP ANTIMIC 1/2X4 (GAUZE/BANDAGES/DRESSINGS) ×3 IMPLANT
DERMABOND ADVANCED (GAUZE/BANDAGES/DRESSINGS) ×1
DERMABOND ADVANCED .7 DNX12 (GAUZE/BANDAGES/DRESSINGS) ×2 IMPLANT
DRSG COVADERM 4X6 (GAUZE/BANDAGES/DRESSINGS) ×3 IMPLANT
ELECT REM PT RETURN 9FT ADLT (ELECTROSURGICAL) ×3
ELECTRODE REM PT RTRN 9FT ADLT (ELECTROSURGICAL) ×2 IMPLANT
GAUZE SPONGE 4X4 16PLY XRAY LF (GAUZE/BANDAGES/DRESSINGS) ×3 IMPLANT
GLOVE SURG SS PI 8.0 STRL IVOR (GLOVE) ×3 IMPLANT
GOWN STRL REUS W/ TWL LRG LVL3 (GOWN DISPOSABLE) ×4 IMPLANT
GOWN STRL REUS W/ TWL XL LVL3 (GOWN DISPOSABLE) ×2 IMPLANT
GOWN STRL REUS W/TWL LRG LVL3 (GOWN DISPOSABLE) ×6
GOWN STRL REUS W/TWL XL LVL3 (GOWN DISPOSABLE) ×3
GRAFT GORETEX STND 6X20 (Vascular Products) ×3 IMPLANT
GRAFT GORETEXSTD 6X20 (Vascular Products) ×2 IMPLANT
HEMOSTAT SNOW SURGICEL 2X4 (HEMOSTASIS) ×9 IMPLANT
INSERT FOGARTY SM (MISCELLANEOUS) ×6 IMPLANT
KIT BASIN OR (CUSTOM PROCEDURE TRAY) ×3 IMPLANT
KIT TURNOVER KIT B (KITS) ×3 IMPLANT
NS IRRIG 1000ML POUR BTL (IV SOLUTION) ×3 IMPLANT
PACK CV ACCESS (CUSTOM PROCEDURE TRAY) ×3 IMPLANT
PAD ARMBOARD 7.5X6 YLW CONV (MISCELLANEOUS) ×6 IMPLANT
SPONGE LAP 18X18 X RAY DECT (DISPOSABLE) ×3 IMPLANT
STRIP CLOSURE SKIN 1/2X4 (GAUZE/BANDAGES/DRESSINGS) ×3 IMPLANT
SUT GORETEX 6.0 TT9 (SUTURE) IMPLANT
SUT MNCRL AB 4-0 PS2 18 (SUTURE) ×6 IMPLANT
SUT PROLENE 5 0 C 1 24 (SUTURE) ×6 IMPLANT
SUT PROLENE 6 0 BV (SUTURE) ×6 IMPLANT
SUT VIC AB 3-0 SH 27 (SUTURE) ×6
SUT VIC AB 3-0 SH 27X BRD (SUTURE) ×4 IMPLANT
TOWEL GREEN STERILE (TOWEL DISPOSABLE) ×3 IMPLANT
UNDERPAD 30X36 HEAVY ABSORB (UNDERPADS AND DIAPERS) ×3 IMPLANT
WATER STERILE IRR 1000ML POUR (IV SOLUTION) ×3 IMPLANT

## 2020-09-05 NOTE — Anesthesia Preprocedure Evaluation (Signed)
Anesthesia Evaluation  Patient identified by MRN, date of birth, ID band Patient awake    Reviewed: Allergy & Precautions, NPO status , Patient's Chart, lab work & pertinent test results  Airway Mallampati: II  TM Distance: >3 FB Neck ROM: Full    Dental  (+) Edentulous Upper, Partial Lower   Pulmonary former smoker,    breath sounds clear to auscultation       Cardiovascular hypertension,  Rhythm:Regular Rate:Normal     Neuro/Psych    GI/Hepatic   Endo/Other  diabetes  Renal/GU      Musculoskeletal   Abdominal   Peds  Hematology   Anesthesia Other Findings   Reproductive/Obstetrics                             Anesthesia Physical Anesthesia Plan  ASA: III  Anesthesia Plan: General   Post-op Pain Management:    Induction: Intravenous  PONV Risk Score and Plan: Ondansetron  Airway Management Planned: LMA  Additional Equipment:   Intra-op Plan:   Post-operative Plan:   Informed Consent: I have reviewed the patients History and Physical, chart, labs and discussed the procedure including the risks, benefits and alternatives for the proposed anesthesia with the patient or authorized representative who has indicated his/her understanding and acceptance.     Dental advisory given  Plan Discussed with: CRNA and Anesthesiologist  Anesthesia Plan Comments:         Anesthesia Quick Evaluation

## 2020-09-05 NOTE — Op Note (Signed)
DATE OF SERVICE: 09/05/2020  PATIENT:  Destiny Day  70 y.o. female  PRE-OPERATIVE DIAGNOSIS:  ESRD, poorly functioning LUE AVG  POST-OPERATIVE DIAGNOSIS:  Same  PROCEDURE:   1) placement of tunneled dialysis catheter (23cm, [facility]) 2) left upper extremity arteriovenous graft revision  SURGEON:  Surgeon(s) and Role:    * Cherre Robins, MD - Primary  ASSISTANT: Risa Grill, PA-C  An assistant was required to facilitate exposure and expedite the case.  ANESTHESIA:   general  EBL: 146mL  BLOOD ADMINISTERED:none  DRAINS: none   LOCAL MEDICATIONS USED:  LIDOCAINE   SPECIMEN:  none  COUNTS: confirmed correct.  TOURNIQUET:  * No tourniquets in log *  PATIENT DISPOSITION:  PACU - hemodynamically stable.   Delay start of Pharmacological VTE agent (>24hrs) due to surgical blood loss or risk of bleeding: no  INDICATION FOR PROCEDURE: Destiny Day is a 70 y.o. female with ESRD with poorly functioning LUE AVG. 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 After careful discussion of risks, benefits, and alternatives the patient was offered AVG revision. We specifically discussed the need for tunneled dialysis catheter to allow the new graft to heal. The patient understood and wished to proceed.  OPERATIVE FINDINGS:  Unremarkable LIJ TDC Previous AVG exposed and controlled with clamps Diseased portion ligated, graft immediately adjacent to anastmoses beveled New 13mm graft tunneled OUTSIDE previous graft End-to-end anastomoses between remnants and new graft. Good doppler flow at completion.  DESCRIPTION OF PROCEDURE: After identification of the patient in the pre-operative holding area, the patient was transferred to the operating room. The patient was positioned supine on the operating room table. Anesthesia was induced. The neck, chest, and left arm were prepped and draped in standard fashion. A surgical pause was performed  confirming correct patient, procedure, and operative location.  Using ultrasound guidance the left internal jugular vein was accessed with micropuncture technique.  Through the micropuncture sheath a floppy J-wire was advanced into the superior vena cava.  A small incision was made around the skin access point.  The access point was serially dilated under direct fluoroscopic guidance.  A peel-away sheath was introduced into the superior vena cava under fluoroscopic guidance.  A counterincision was made in the chest under the clavicle.  A 23 cm tunnel dialysis catheter was then tunneled under the skin, over the clavicle into the incision in the neck.  The tunneling device was removed and the catheter fed through the peel-away sheath into the superior vena cava.  The peel-away sheath was removed and the catheter gently pulled back.  Adequate position was confirmed with x-ray.  The catheter was tested and found to flush and draw back well.  Catheter was heparin locked.  Caps were applied.  Catheter was sutured to the skin.  The neck incision was closed with 4-0 Monocryl.  The left AVG inflow segment was exposed using a longitudinal incision over the graft.   Incision was carried down through subcutaneous tissue until the graft was exposed. I then controlled the inflow with a Gregory clamp.    The left AVG outflow segment was exposed using a longitudinal incision over the graft.   Incision was carried down through subcutaneous tissue until the graft was exposed. I then controlled the inflow with a Gregory clamp.   Using a curved, sheathed tunneling device, a 6 mm tapered Gore-Tex graft was tunneled subcutaneously and gentle arc across the biceps of the left arm.  Patient was  then heparinized with 5000 units of IV heparin.   The middle portion of the graft was transected and ligated. The remnant pieces were beveled to allow for end-to-end anastomosis. Similarly, the graft was beveled. End-to-end anastomoses  were performed between the remnant graft and new graft using 5-O prolene suture. Immediately prior to completion the anastomosis was de-aired and flushed.  Anastomosis was then completed.  Hemostasis was insured.   Doppler machine was brought onto the field to interrogate the graft.  Doppler flow was noted in the proximal and distal remnant graft.   Surgical beds were irrigated copiously.  Hemostasis was again ensured in the surgical beds.  The wounds were closed in layers using 3-0 Vicryl and 4-0 Monocryl.  Clean bandages were applied.  Upon completion of the case instrument and sharps counts were confirmed correct. The patient was transferred to the  PACU in good condition. I was present for all portions of the procedure.  Yevonne Aline. Stanford Breed, MD [facility] Phone Number: [phone] 09/05/2020 1:44 PM

## 2020-09-05 NOTE — Interval H&P Note (Signed)
History and Physical Interval Note:  09/05/2020 10:25 AM  Destiny Day  has presented today for surgery, with the diagnosis of esrd.  The various methods of treatment have been discussed with the patient and family. After consideration of risks, benefits and other options for treatment, the patient has consented to  Procedure(s): REVISION OF ARTERIOVENOUS GORETEX GRAFT LEFT (Left) as a surgical intervention.  The patient's history has been reviewed, patient examined, no change in status, stable for surgery.  I have reviewed the patient's chart and labs.  Questions were answered to the patient's satisfaction.     Cherre Robins

## 2020-09-05 NOTE — Discharge Instructions (Signed)
   [facility]  Discharge Instructions  AV Fistula or Graft Surgery for Dialysis Access  Please refer to the following instructions for your post-procedure care. Your surgeon or physician assistant will discuss any changes with you.  Activity  You may drive the day following your surgery, if you are comfortable and no longer taking prescription pain medication. Resume full activity as the soreness in your incision resolves.  Bathing/Showering  You may shower after you go home. Keep your incision dry for 48 hours. Do not soak in a bathtub, hot tub, or swim until the incision heals completely. You may not shower if you have a hemodialysis catheter.  Incision Care  Clean your incision with mild soap and water after 48 hours. Pat the area dry with a clean towel. You do not need a bandage unless otherwise instructed. Do not apply any ointments or creams to your incision. You may have skin glue on your incision. Do not peel it off. It will come off on its own in about one week. Your arm may swell a bit after surgery. To reduce swelling use pillows to elevate your arm so it is above your heart. Your doctor will tell you if you need to lightly wrap your arm with an ACE bandage.  Diet  Resume your normal diet. There are not special food restrictions following this procedure. In order to heal from your surgery, it is CRITICAL to get adequate nutrition. Your body requires vitamins, minerals, and protein. Vegetables are the best source of vitamins and minerals. Vegetables also provide the perfect balance of protein. Processed food has little nutritional value, so try to avoid this.  Medications  Resume taking all of your medications. If your incision is causing pain, you may take over-the counter pain relievers such as acetaminophen (Tylenol). If you were prescribed a stronger pain medication, please be aware these medications can cause nausea and constipation. Prevent  nausea by taking the medication with a snack or meal. Avoid constipation by drinking plenty of fluids and eating foods with high amount of fiber, such as fruits, vegetables, and grains. Do not take Tylenol if you are taking prescription pain medications.     Follow up Your surgeon may want to see you in the office following your access surgery. If so, this will be arranged at the time of your surgery.  Please call us immediately for any of the following conditions:  Increased pain, redness, drainage (pus) from your incision site Fever of 101 degrees or higher Severe or worsening pain at your incision site Hand pain or numbness.  Reduce your risk of vascular disease:  Stop smoking. If you would like help, call [facility] at 1-800-QUIT-NOW 430-708-2988) or [place] at [place] your cholesterol Maintain a desired weight Control your diabetes Keep your blood pressure down  Dialysis  It will take several weeks to several months for your new dialysis access to be ready for use. Your surgeon will determine when it is OK to use it. Your nephrologist will continue to direct your dialysis. You can continue to use your Permcath until your new access is ready for use.  If you have any questions, please call the office at [phone].  Leave ACE wrap on until Saturday then remove all dressings and leave steri-strips in place and allow them to wear off

## 2020-09-05 NOTE — Transfer of Care (Signed)
Immediate Anesthesia Transfer of Care Note  Patient: Destiny Day  Procedure(s) Performed: REVISION OF ARTERIOVENOUS GORETEX GRAFT LEFT (Left Arm Upper) INSERTION OF DIALYSIS CATHETER (N/A Chest)  Patient Location: PACU  Anesthesia Type:General  Level of Consciousness: awake, alert , oriented and patient cooperative  Airway & Oxygen Therapy: Patient Spontanous Breathing and Patient connected to face mask oxygen  Post-op Assessment: Report given to RN, Post -op Vital signs reviewed and stable and Patient moving all extremities  Post vital signs: Reviewed and stable  Last Vitals:  Vitals Value Taken Time  BP 109/73 09/05/20 1431  Temp    Pulse 79 09/05/20 1433  Resp 17 09/05/20 1433  SpO2 100 % 09/05/20 1433  Vitals shown include unvalidated device data.  Last Pain:  Vitals:   09/05/20 1059  TempSrc:   PainSc: 0-No pain         Complications: No complications documented.

## 2020-09-05 NOTE — Anesthesia Postprocedure Evaluation (Signed)
Anesthesia Post Note  Patient: Destiny Day  Procedure(s) Performed: REVISION OF ARTERIOVENOUS GORETEX GRAFT LEFT (Left Arm Upper) INSERTION OF DIALYSIS CATHETER (N/A Chest)     Patient location during evaluation: PACU Anesthesia Type: General Level of consciousness: awake and alert Pain management: pain level controlled Vital Signs Assessment: post-procedure vital signs reviewed and stable Respiratory status: spontaneous breathing, nonlabored ventilation, respiratory function stable and patient connected to nasal cannula oxygen Cardiovascular status: blood pressure returned to baseline and stable Postop Assessment: no apparent nausea or vomiting Anesthetic complications: no   No complications documented.  Last Vitals:  Vitals:   09/05/20 1529 09/05/20 1530  BP:  (!) 106/34  Pulse: 83 84  Resp: 16 16  Temp:    SpO2: 97% 99%    Last Pain:  Vitals:   09/05/20 1500  TempSrc:   PainSc: 7                  Greig Altergott COKER

## 2020-09-05 NOTE — Anesthesia Procedure Notes (Signed)
Procedure Name: LMA Insertion Date/Time: 09/05/2020 12:17 PM Performed by: Myna Bright, CRNA Pre-anesthesia Checklist: Patient identified, Emergency Drugs available, Suction available and Patient being monitored Patient Re-evaluated:Patient Re-evaluated prior to induction Oxygen Delivery Method: Circle system utilized Preoxygenation: Pre-oxygenation with 100% oxygen Induction Type: IV induction Ventilation: Mask ventilation without difficulty LMA: LMA inserted LMA Size: 4.0 Number of attempts: 1 Placement Confirmation: positive ETCO2 and breath sounds checked- equal and bilateral Tube secured with: Tape Dental Injury: Teeth and Oropharynx as per pre-operative assessment

## 2020-09-06 ENCOUNTER — Encounter (HOSPITAL_COMMUNITY): Payer: Self-pay | Admitting: Vascular Surgery

## 2020-09-06 DIAGNOSIS — N2581 Secondary hyperparathyroidism of renal origin: Secondary | ICD-10-CM | POA: Diagnosis not present

## 2020-09-06 DIAGNOSIS — D689 Coagulation defect, unspecified: Secondary | ICD-10-CM | POA: Diagnosis not present

## 2020-09-06 DIAGNOSIS — Z992 Dependence on renal dialysis: Secondary | ICD-10-CM | POA: Diagnosis not present

## 2020-09-06 DIAGNOSIS — T782XXA Anaphylactic shock, unspecified, initial encounter: Secondary | ICD-10-CM | POA: Diagnosis not present

## 2020-09-06 DIAGNOSIS — N186 End stage renal disease: Secondary | ICD-10-CM | POA: Diagnosis not present

## 2020-09-06 DIAGNOSIS — E876 Hypokalemia: Secondary | ICD-10-CM | POA: Diagnosis not present

## 2020-09-09 DIAGNOSIS — N186 End stage renal disease: Secondary | ICD-10-CM | POA: Diagnosis not present

## 2020-09-09 DIAGNOSIS — E876 Hypokalemia: Secondary | ICD-10-CM | POA: Diagnosis not present

## 2020-09-09 DIAGNOSIS — T782XXA Anaphylactic shock, unspecified, initial encounter: Secondary | ICD-10-CM | POA: Diagnosis not present

## 2020-09-09 DIAGNOSIS — Z992 Dependence on renal dialysis: Secondary | ICD-10-CM | POA: Diagnosis not present

## 2020-09-09 DIAGNOSIS — D689 Coagulation defect, unspecified: Secondary | ICD-10-CM | POA: Diagnosis not present

## 2020-09-09 DIAGNOSIS — N2581 Secondary hyperparathyroidism of renal origin: Secondary | ICD-10-CM | POA: Diagnosis not present

## 2020-09-10 ENCOUNTER — Ambulatory Visit (INDEPENDENT_AMBULATORY_CARE_PROVIDER_SITE_OTHER): Payer: Medicare Other | Admitting: *Deleted

## 2020-09-10 DIAGNOSIS — J449 Chronic obstructive pulmonary disease, unspecified: Secondary | ICD-10-CM | POA: Diagnosis not present

## 2020-09-10 DIAGNOSIS — J309 Allergic rhinitis, unspecified: Secondary | ICD-10-CM | POA: Diagnosis not present

## 2020-09-10 DIAGNOSIS — E1140 Type 2 diabetes mellitus with diabetic neuropathy, unspecified: Secondary | ICD-10-CM | POA: Diagnosis not present

## 2020-09-10 DIAGNOSIS — Z48812 Encounter for surgical aftercare following surgery on the circulatory system: Secondary | ICD-10-CM | POA: Diagnosis not present

## 2020-09-10 DIAGNOSIS — R2681 Unsteadiness on feet: Secondary | ICD-10-CM | POA: Diagnosis not present

## 2020-09-10 DIAGNOSIS — R112 Nausea with vomiting, unspecified: Secondary | ICD-10-CM | POA: Insufficient documentation

## 2020-09-10 DIAGNOSIS — T82510D Breakdown (mechanical) of surgically created arteriovenous fistula, subsequent encounter: Secondary | ICD-10-CM | POA: Diagnosis not present

## 2020-09-10 DIAGNOSIS — E1122 Type 2 diabetes mellitus with diabetic chronic kidney disease: Secondary | ICD-10-CM | POA: Diagnosis not present

## 2020-09-10 DIAGNOSIS — Z992 Dependence on renal dialysis: Secondary | ICD-10-CM | POA: Diagnosis not present

## 2020-09-10 DIAGNOSIS — N186 End stage renal disease: Secondary | ICD-10-CM | POA: Diagnosis not present

## 2020-09-11 DIAGNOSIS — N186 End stage renal disease: Secondary | ICD-10-CM | POA: Diagnosis not present

## 2020-09-11 DIAGNOSIS — D689 Coagulation defect, unspecified: Secondary | ICD-10-CM | POA: Diagnosis not present

## 2020-09-11 DIAGNOSIS — N2581 Secondary hyperparathyroidism of renal origin: Secondary | ICD-10-CM | POA: Diagnosis not present

## 2020-09-11 DIAGNOSIS — Z992 Dependence on renal dialysis: Secondary | ICD-10-CM | POA: Diagnosis not present

## 2020-09-11 DIAGNOSIS — T782XXA Anaphylactic shock, unspecified, initial encounter: Secondary | ICD-10-CM | POA: Diagnosis not present

## 2020-09-11 DIAGNOSIS — E876 Hypokalemia: Secondary | ICD-10-CM | POA: Diagnosis not present

## 2020-09-12 ENCOUNTER — Other Ambulatory Visit: Payer: Self-pay

## 2020-09-12 DIAGNOSIS — T82510D Breakdown (mechanical) of surgically created arteriovenous fistula, subsequent encounter: Secondary | ICD-10-CM | POA: Diagnosis not present

## 2020-09-12 DIAGNOSIS — N186 End stage renal disease: Secondary | ICD-10-CM | POA: Diagnosis not present

## 2020-09-12 DIAGNOSIS — J449 Chronic obstructive pulmonary disease, unspecified: Secondary | ICD-10-CM | POA: Diagnosis not present

## 2020-09-12 DIAGNOSIS — Z992 Dependence on renal dialysis: Secondary | ICD-10-CM | POA: Diagnosis not present

## 2020-09-12 DIAGNOSIS — E1122 Type 2 diabetes mellitus with diabetic chronic kidney disease: Secondary | ICD-10-CM | POA: Diagnosis not present

## 2020-09-12 DIAGNOSIS — Z48812 Encounter for surgical aftercare following surgery on the circulatory system: Secondary | ICD-10-CM | POA: Diagnosis not present

## 2020-09-13 DIAGNOSIS — Z992 Dependence on renal dialysis: Secondary | ICD-10-CM | POA: Diagnosis not present

## 2020-09-13 DIAGNOSIS — E876 Hypokalemia: Secondary | ICD-10-CM | POA: Diagnosis not present

## 2020-09-13 DIAGNOSIS — D689 Coagulation defect, unspecified: Secondary | ICD-10-CM | POA: Diagnosis not present

## 2020-09-13 DIAGNOSIS — T782XXA Anaphylactic shock, unspecified, initial encounter: Secondary | ICD-10-CM | POA: Diagnosis not present

## 2020-09-13 DIAGNOSIS — N186 End stage renal disease: Secondary | ICD-10-CM | POA: Diagnosis not present

## 2020-09-13 DIAGNOSIS — N2581 Secondary hyperparathyroidism of renal origin: Secondary | ICD-10-CM | POA: Diagnosis not present

## 2020-09-16 DIAGNOSIS — D689 Coagulation defect, unspecified: Secondary | ICD-10-CM | POA: Diagnosis not present

## 2020-09-16 DIAGNOSIS — T782XXA Anaphylactic shock, unspecified, initial encounter: Secondary | ICD-10-CM | POA: Diagnosis not present

## 2020-09-16 DIAGNOSIS — N186 End stage renal disease: Secondary | ICD-10-CM | POA: Diagnosis not present

## 2020-09-16 DIAGNOSIS — Z992 Dependence on renal dialysis: Secondary | ICD-10-CM | POA: Diagnosis not present

## 2020-09-16 DIAGNOSIS — N2581 Secondary hyperparathyroidism of renal origin: Secondary | ICD-10-CM | POA: Diagnosis not present

## 2020-09-16 DIAGNOSIS — E876 Hypokalemia: Secondary | ICD-10-CM | POA: Diagnosis not present

## 2020-09-17 ENCOUNTER — Ambulatory Visit (INDEPENDENT_AMBULATORY_CARE_PROVIDER_SITE_OTHER): Payer: Medicare Other | Admitting: *Deleted

## 2020-09-17 DIAGNOSIS — J309 Allergic rhinitis, unspecified: Secondary | ICD-10-CM

## 2020-09-17 DIAGNOSIS — Z992 Dependence on renal dialysis: Secondary | ICD-10-CM | POA: Diagnosis not present

## 2020-09-17 DIAGNOSIS — Z48812 Encounter for surgical aftercare following surgery on the circulatory system: Secondary | ICD-10-CM | POA: Diagnosis not present

## 2020-09-17 DIAGNOSIS — E1122 Type 2 diabetes mellitus with diabetic chronic kidney disease: Secondary | ICD-10-CM | POA: Diagnosis not present

## 2020-09-17 DIAGNOSIS — T82510D Breakdown (mechanical) of surgically created arteriovenous fistula, subsequent encounter: Secondary | ICD-10-CM | POA: Diagnosis not present

## 2020-09-17 DIAGNOSIS — J449 Chronic obstructive pulmonary disease, unspecified: Secondary | ICD-10-CM | POA: Diagnosis not present

## 2020-09-17 DIAGNOSIS — N186 End stage renal disease: Secondary | ICD-10-CM | POA: Diagnosis not present

## 2020-09-18 DIAGNOSIS — D689 Coagulation defect, unspecified: Secondary | ICD-10-CM | POA: Diagnosis not present

## 2020-09-18 DIAGNOSIS — N2581 Secondary hyperparathyroidism of renal origin: Secondary | ICD-10-CM | POA: Diagnosis not present

## 2020-09-18 DIAGNOSIS — N186 End stage renal disease: Secondary | ICD-10-CM | POA: Diagnosis not present

## 2020-09-18 DIAGNOSIS — T782XXA Anaphylactic shock, unspecified, initial encounter: Secondary | ICD-10-CM | POA: Diagnosis not present

## 2020-09-18 DIAGNOSIS — Z992 Dependence on renal dialysis: Secondary | ICD-10-CM | POA: Diagnosis not present

## 2020-09-18 DIAGNOSIS — E876 Hypokalemia: Secondary | ICD-10-CM | POA: Diagnosis not present

## 2020-09-19 ENCOUNTER — Ambulatory Visit: Payer: Medicare Other

## 2020-09-19 ENCOUNTER — Encounter (HOSPITAL_COMMUNITY): Payer: Medicare Other

## 2020-09-19 DIAGNOSIS — E1122 Type 2 diabetes mellitus with diabetic chronic kidney disease: Secondary | ICD-10-CM | POA: Diagnosis not present

## 2020-09-19 DIAGNOSIS — N186 End stage renal disease: Secondary | ICD-10-CM | POA: Diagnosis not present

## 2020-09-19 DIAGNOSIS — T82510D Breakdown (mechanical) of surgically created arteriovenous fistula, subsequent encounter: Secondary | ICD-10-CM | POA: Diagnosis not present

## 2020-09-19 DIAGNOSIS — Z48812 Encounter for surgical aftercare following surgery on the circulatory system: Secondary | ICD-10-CM | POA: Diagnosis not present

## 2020-09-19 DIAGNOSIS — Z992 Dependence on renal dialysis: Secondary | ICD-10-CM | POA: Diagnosis not present

## 2020-09-19 DIAGNOSIS — J449 Chronic obstructive pulmonary disease, unspecified: Secondary | ICD-10-CM | POA: Diagnosis not present

## 2020-09-20 DIAGNOSIS — D689 Coagulation defect, unspecified: Secondary | ICD-10-CM | POA: Diagnosis not present

## 2020-09-20 DIAGNOSIS — N2581 Secondary hyperparathyroidism of renal origin: Secondary | ICD-10-CM | POA: Diagnosis not present

## 2020-09-20 DIAGNOSIS — N186 End stage renal disease: Secondary | ICD-10-CM | POA: Diagnosis not present

## 2020-09-20 DIAGNOSIS — E876 Hypokalemia: Secondary | ICD-10-CM | POA: Diagnosis not present

## 2020-09-20 DIAGNOSIS — T782XXA Anaphylactic shock, unspecified, initial encounter: Secondary | ICD-10-CM | POA: Diagnosis not present

## 2020-09-20 DIAGNOSIS — Z992 Dependence on renal dialysis: Secondary | ICD-10-CM | POA: Diagnosis not present

## 2020-09-23 DIAGNOSIS — T782XXA Anaphylactic shock, unspecified, initial encounter: Secondary | ICD-10-CM | POA: Diagnosis not present

## 2020-09-23 DIAGNOSIS — Z992 Dependence on renal dialysis: Secondary | ICD-10-CM | POA: Diagnosis not present

## 2020-09-23 DIAGNOSIS — D689 Coagulation defect, unspecified: Secondary | ICD-10-CM | POA: Diagnosis not present

## 2020-09-23 DIAGNOSIS — E876 Hypokalemia: Secondary | ICD-10-CM | POA: Diagnosis not present

## 2020-09-23 DIAGNOSIS — N186 End stage renal disease: Secondary | ICD-10-CM | POA: Diagnosis not present

## 2020-09-23 DIAGNOSIS — N2581 Secondary hyperparathyroidism of renal origin: Secondary | ICD-10-CM | POA: Diagnosis not present

## 2020-09-24 DIAGNOSIS — J449 Chronic obstructive pulmonary disease, unspecified: Secondary | ICD-10-CM | POA: Diagnosis not present

## 2020-09-24 DIAGNOSIS — Z992 Dependence on renal dialysis: Secondary | ICD-10-CM | POA: Diagnosis not present

## 2020-09-24 DIAGNOSIS — E1122 Type 2 diabetes mellitus with diabetic chronic kidney disease: Secondary | ICD-10-CM | POA: Diagnosis not present

## 2020-09-24 DIAGNOSIS — D509 Iron deficiency anemia, unspecified: Secondary | ICD-10-CM | POA: Diagnosis not present

## 2020-09-24 DIAGNOSIS — Z48812 Encounter for surgical aftercare following surgery on the circulatory system: Secondary | ICD-10-CM | POA: Diagnosis not present

## 2020-09-24 DIAGNOSIS — T82510D Breakdown (mechanical) of surgically created arteriovenous fistula, subsequent encounter: Secondary | ICD-10-CM | POA: Diagnosis not present

## 2020-09-24 DIAGNOSIS — E876 Hypokalemia: Secondary | ICD-10-CM | POA: Diagnosis not present

## 2020-09-24 DIAGNOSIS — N2581 Secondary hyperparathyroidism of renal origin: Secondary | ICD-10-CM | POA: Diagnosis not present

## 2020-09-24 DIAGNOSIS — N186 End stage renal disease: Secondary | ICD-10-CM | POA: Diagnosis not present

## 2020-09-24 DIAGNOSIS — I159 Secondary hypertension, unspecified: Secondary | ICD-10-CM | POA: Diagnosis not present

## 2020-09-24 DIAGNOSIS — D631 Anemia in chronic kidney disease: Secondary | ICD-10-CM | POA: Diagnosis not present

## 2020-09-25 ENCOUNTER — Encounter (INDEPENDENT_AMBULATORY_CARE_PROVIDER_SITE_OTHER): Payer: Self-pay

## 2020-09-25 DIAGNOSIS — N186 End stage renal disease: Secondary | ICD-10-CM | POA: Diagnosis not present

## 2020-09-25 DIAGNOSIS — Z992 Dependence on renal dialysis: Secondary | ICD-10-CM | POA: Diagnosis not present

## 2020-09-25 DIAGNOSIS — E876 Hypokalemia: Secondary | ICD-10-CM | POA: Diagnosis not present

## 2020-09-25 DIAGNOSIS — D509 Iron deficiency anemia, unspecified: Secondary | ICD-10-CM | POA: Diagnosis not present

## 2020-09-25 DIAGNOSIS — D631 Anemia in chronic kidney disease: Secondary | ICD-10-CM | POA: Diagnosis not present

## 2020-09-25 DIAGNOSIS — N2581 Secondary hyperparathyroidism of renal origin: Secondary | ICD-10-CM | POA: Diagnosis not present

## 2020-09-26 ENCOUNTER — Ambulatory Visit (INDEPENDENT_AMBULATORY_CARE_PROVIDER_SITE_OTHER): Payer: Medicare Other

## 2020-09-26 DIAGNOSIS — E785 Hyperlipidemia, unspecified: Secondary | ICD-10-CM | POA: Diagnosis not present

## 2020-09-26 DIAGNOSIS — E213 Hyperparathyroidism, unspecified: Secondary | ICD-10-CM | POA: Diagnosis not present

## 2020-09-26 DIAGNOSIS — E113599 Type 2 diabetes mellitus with proliferative diabetic retinopathy without macular edema, unspecified eye: Secondary | ICD-10-CM | POA: Diagnosis not present

## 2020-09-26 DIAGNOSIS — T82510D Breakdown (mechanical) of surgically created arteriovenous fistula, subsequent encounter: Secondary | ICD-10-CM | POA: Diagnosis not present

## 2020-09-26 DIAGNOSIS — J449 Chronic obstructive pulmonary disease, unspecified: Secondary | ICD-10-CM | POA: Diagnosis not present

## 2020-09-26 DIAGNOSIS — K219 Gastro-esophageal reflux disease without esophagitis: Secondary | ICD-10-CM | POA: Diagnosis not present

## 2020-09-26 DIAGNOSIS — N186 End stage renal disease: Secondary | ICD-10-CM | POA: Diagnosis not present

## 2020-09-26 DIAGNOSIS — J309 Allergic rhinitis, unspecified: Secondary | ICD-10-CM

## 2020-09-26 DIAGNOSIS — I871 Compression of vein: Secondary | ICD-10-CM | POA: Diagnosis not present

## 2020-09-26 DIAGNOSIS — Z008 Encounter for other general examination: Secondary | ICD-10-CM | POA: Diagnosis not present

## 2020-09-26 DIAGNOSIS — Z48812 Encounter for surgical aftercare following surgery on the circulatory system: Secondary | ICD-10-CM | POA: Diagnosis not present

## 2020-09-26 DIAGNOSIS — T82898A Other specified complication of vascular prosthetic devices, implants and grafts, initial encounter: Secondary | ICD-10-CM | POA: Diagnosis not present

## 2020-09-26 DIAGNOSIS — Z992 Dependence on renal dialysis: Secondary | ICD-10-CM | POA: Diagnosis not present

## 2020-09-26 DIAGNOSIS — R1110 Vomiting, unspecified: Secondary | ICD-10-CM | POA: Diagnosis not present

## 2020-09-26 DIAGNOSIS — Z0000 Encounter for general adult medical examination without abnormal findings: Secondary | ICD-10-CM | POA: Diagnosis not present

## 2020-09-26 DIAGNOSIS — I132 Hypertensive heart and chronic kidney disease with heart failure and with stage 5 chronic kidney disease, or end stage renal disease: Secondary | ICD-10-CM | POA: Diagnosis not present

## 2020-09-26 DIAGNOSIS — E1122 Type 2 diabetes mellitus with diabetic chronic kidney disease: Secondary | ICD-10-CM | POA: Diagnosis not present

## 2020-09-26 DIAGNOSIS — E1151 Type 2 diabetes mellitus with diabetic peripheral angiopathy without gangrene: Secondary | ICD-10-CM | POA: Diagnosis not present

## 2020-09-27 DIAGNOSIS — Z992 Dependence on renal dialysis: Secondary | ICD-10-CM | POA: Diagnosis not present

## 2020-09-27 DIAGNOSIS — D509 Iron deficiency anemia, unspecified: Secondary | ICD-10-CM | POA: Diagnosis not present

## 2020-09-27 DIAGNOSIS — N2581 Secondary hyperparathyroidism of renal origin: Secondary | ICD-10-CM | POA: Diagnosis not present

## 2020-09-27 DIAGNOSIS — E876 Hypokalemia: Secondary | ICD-10-CM | POA: Diagnosis not present

## 2020-09-27 DIAGNOSIS — T82510D Breakdown (mechanical) of surgically created arteriovenous fistula, subsequent encounter: Secondary | ICD-10-CM | POA: Diagnosis not present

## 2020-09-27 DIAGNOSIS — E1122 Type 2 diabetes mellitus with diabetic chronic kidney disease: Secondary | ICD-10-CM | POA: Diagnosis not present

## 2020-09-27 DIAGNOSIS — D631 Anemia in chronic kidney disease: Secondary | ICD-10-CM | POA: Diagnosis not present

## 2020-09-27 DIAGNOSIS — N186 End stage renal disease: Secondary | ICD-10-CM | POA: Diagnosis not present

## 2020-09-27 DIAGNOSIS — Z48812 Encounter for surgical aftercare following surgery on the circulatory system: Secondary | ICD-10-CM | POA: Diagnosis not present

## 2020-09-27 DIAGNOSIS — J449 Chronic obstructive pulmonary disease, unspecified: Secondary | ICD-10-CM | POA: Diagnosis not present

## 2020-09-30 ENCOUNTER — Other Ambulatory Visit: Payer: Self-pay | Admitting: Student

## 2020-09-30 ENCOUNTER — Encounter: Payer: Self-pay | Admitting: *Deleted

## 2020-09-30 DIAGNOSIS — N186 End stage renal disease: Secondary | ICD-10-CM | POA: Diagnosis not present

## 2020-09-30 DIAGNOSIS — E876 Hypokalemia: Secondary | ICD-10-CM | POA: Diagnosis not present

## 2020-09-30 DIAGNOSIS — R110 Nausea: Secondary | ICD-10-CM

## 2020-09-30 DIAGNOSIS — D631 Anemia in chronic kidney disease: Secondary | ICD-10-CM | POA: Diagnosis not present

## 2020-09-30 DIAGNOSIS — N2581 Secondary hyperparathyroidism of renal origin: Secondary | ICD-10-CM | POA: Diagnosis not present

## 2020-09-30 DIAGNOSIS — D509 Iron deficiency anemia, unspecified: Secondary | ICD-10-CM | POA: Diagnosis not present

## 2020-09-30 DIAGNOSIS — Z992 Dependence on renal dialysis: Secondary | ICD-10-CM | POA: Diagnosis not present

## 2020-09-30 NOTE — Telephone Encounter (Signed)
This encounter was created in error - please disregard.

## 2020-10-01 ENCOUNTER — Encounter (HOSPITAL_COMMUNITY): Payer: Medicare Other

## 2020-10-01 ENCOUNTER — Ambulatory Visit (INDEPENDENT_AMBULATORY_CARE_PROVIDER_SITE_OTHER): Payer: Medicare Other | Admitting: *Deleted

## 2020-10-01 DIAGNOSIS — T82510D Breakdown (mechanical) of surgically created arteriovenous fistula, subsequent encounter: Secondary | ICD-10-CM | POA: Diagnosis not present

## 2020-10-01 DIAGNOSIS — E1122 Type 2 diabetes mellitus with diabetic chronic kidney disease: Secondary | ICD-10-CM | POA: Diagnosis not present

## 2020-10-01 DIAGNOSIS — J309 Allergic rhinitis, unspecified: Secondary | ICD-10-CM

## 2020-10-01 DIAGNOSIS — J449 Chronic obstructive pulmonary disease, unspecified: Secondary | ICD-10-CM | POA: Diagnosis not present

## 2020-10-01 DIAGNOSIS — Z48812 Encounter for surgical aftercare following surgery on the circulatory system: Secondary | ICD-10-CM | POA: Diagnosis not present

## 2020-10-01 DIAGNOSIS — N186 End stage renal disease: Secondary | ICD-10-CM | POA: Diagnosis not present

## 2020-10-01 DIAGNOSIS — Z992 Dependence on renal dialysis: Secondary | ICD-10-CM | POA: Diagnosis not present

## 2020-10-02 DIAGNOSIS — N186 End stage renal disease: Secondary | ICD-10-CM | POA: Diagnosis not present

## 2020-10-02 DIAGNOSIS — N2581 Secondary hyperparathyroidism of renal origin: Secondary | ICD-10-CM | POA: Diagnosis not present

## 2020-10-02 DIAGNOSIS — Z992 Dependence on renal dialysis: Secondary | ICD-10-CM | POA: Diagnosis not present

## 2020-10-02 DIAGNOSIS — D509 Iron deficiency anemia, unspecified: Secondary | ICD-10-CM | POA: Diagnosis not present

## 2020-10-02 DIAGNOSIS — D631 Anemia in chronic kidney disease: Secondary | ICD-10-CM | POA: Diagnosis not present

## 2020-10-02 DIAGNOSIS — E876 Hypokalemia: Secondary | ICD-10-CM | POA: Diagnosis not present

## 2020-10-03 ENCOUNTER — Other Ambulatory Visit: Payer: Self-pay | Admitting: *Deleted

## 2020-10-03 ENCOUNTER — Encounter (INDEPENDENT_AMBULATORY_CARE_PROVIDER_SITE_OTHER): Payer: Self-pay | Admitting: Ophthalmology

## 2020-10-03 ENCOUNTER — Other Ambulatory Visit: Payer: Self-pay

## 2020-10-03 ENCOUNTER — Ambulatory Visit (INDEPENDENT_AMBULATORY_CARE_PROVIDER_SITE_OTHER): Payer: Medicare Other | Admitting: Ophthalmology

## 2020-10-03 DIAGNOSIS — N186 End stage renal disease: Secondary | ICD-10-CM | POA: Diagnosis not present

## 2020-10-03 DIAGNOSIS — E1122 Type 2 diabetes mellitus with diabetic chronic kidney disease: Secondary | ICD-10-CM | POA: Diagnosis not present

## 2020-10-03 DIAGNOSIS — T82510D Breakdown (mechanical) of surgically created arteriovenous fistula, subsequent encounter: Secondary | ICD-10-CM | POA: Diagnosis not present

## 2020-10-03 DIAGNOSIS — E113512 Type 2 diabetes mellitus with proliferative diabetic retinopathy with macular edema, left eye: Secondary | ICD-10-CM

## 2020-10-03 DIAGNOSIS — H43821 Vitreomacular adhesion, right eye: Secondary | ICD-10-CM | POA: Diagnosis not present

## 2020-10-03 DIAGNOSIS — E113511 Type 2 diabetes mellitus with proliferative diabetic retinopathy with macular edema, right eye: Secondary | ICD-10-CM

## 2020-10-03 DIAGNOSIS — J449 Chronic obstructive pulmonary disease, unspecified: Secondary | ICD-10-CM | POA: Diagnosis not present

## 2020-10-03 DIAGNOSIS — Z48812 Encounter for surgical aftercare following surgery on the circulatory system: Secondary | ICD-10-CM | POA: Diagnosis not present

## 2020-10-03 DIAGNOSIS — Z992 Dependence on renal dialysis: Secondary | ICD-10-CM | POA: Diagnosis not present

## 2020-10-03 MED ORDER — BEVACIZUMAB 2.5 MG/0.1ML IZ SOSY
2.5000 mg | PREFILLED_SYRINGE | INTRAVITREAL | Status: AC | PRN
  Administered 2020-10-03: 2.5 mg via INTRAVITREAL

## 2020-10-03 NOTE — Assessment & Plan Note (Signed)
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

## 2020-10-03 NOTE — Assessment & Plan Note (Signed)
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

## 2020-10-03 NOTE — Progress Notes (Signed)
10/03/2020     CHIEF COMPLAINT Patient presents for Retina Follow Up (6 Week PDR f\u. Possible Avastin OS. OCT/Pt states vision has slightly improved. Denies any new complaints./BGL: did not check/A1C: 6.8)   HISTORY OF PRESENT ILLNESS: Destiny Day is a 70 y.o. female who presents to the clinic today for:   HPI    Retina Follow Up    Patient presents with  Diabetic Retinopathy.  In left eye.  Severity is moderate.  Duration of 6 weeks.  Since onset it is stable.  I, the attending physician,  performed the HPI with the patient and updated documentation appropriately. Additional comments: 6 Week PDR f\u. Possible Avastin OS. OCT Pt states vision has slightly improved. Denies any new complaints. BGL: did not check A1C: 6.8       Last edited by Tilda Franco on 10/03/2020  2:23 PM. (History)      Referring physician: Cipriano Mile, NP [place],  [place] [id number]  HISTORICAL INFORMATION:   Selected notes from the MEDICAL RECORD NUMBER    Lab Results  Component Value Date   HGBA1C 5.6 06/26/2019     CURRENT MEDICATIONS: No current outpatient medications on file. (Ophthalmic Drugs)   No current facility-administered medications for this visit. (Ophthalmic Drugs)   Current Outpatient Medications (Other)  Medication Sig  . albuterol (PROAIR HFA) 108 (90 Base) MCG/ACT inhaler 4 puffs every 4-6 hours as needed (Patient taking differently: Inhale 4 puffs into the lungs every 4 (four) hours as needed for wheezing or shortness of breath. 4 puffs every 4-6 hours as needed)  . amLODipine (NORVASC) 10 MG tablet Take 10 mg by mouth at bedtime.  Marland Kitchen aspirin EC 81 MG tablet Take 81 mg by mouth daily.  Marland Kitchen atorvastatin (LIPITOR) 40 MG tablet Take 1 tablet (40 mg total) by mouth daily.  Marland Kitchen azelastine (ASTELIN) 0.1 % nasal spray Place 1 spray into both nostrils 2 (two) times daily.  Marland Kitchen BIDIL 20-37.5 MG tablet TAKE ONE TABLET BY MOUTH THREE TIMES A DAY  (Patient taking  differently: Take 1 tablet by mouth 3 (three) times daily.)  . budesonide-formoterol (SYMBICORT) 160-4.5 MCG/ACT inhaler 2 puffs twice daily with spacer (Patient taking differently: Inhale into the lungs in the morning and at bedtime. 2 puffs twice daily with spacer)  . busPIRone (BUSPAR) 7.5 MG tablet Take 7.5 mg by mouth 2 (two) times daily.  . carvedilol (COREG) 12.5 MG tablet Take 12.5 mg by mouth See admin instructions. Take 1 tablet (12.5 mg) by mouth once daily in the evening on Mondays, Wednesdays, & Fridays. (Dialysis) Take 1 tablet (12.5 mg) by mouth twice daily on Sundays, Tuesdays, Thursdays, & Saturdays. (Non Dialysis)  . cinacalcet (SENSIPAR) 60 MG tablet Take 90 mg by mouth See admin instructions. Take 1 tablet (90 mg) by mouth in the evening on Mondays, Wednesdays, & Fridays (dialysis) Take 1 tablet (90 mg) by mouth twice daily on Sundays, Tuesdays, Thursdays, Saturdays. (non dialysis)  . docusate sodium (COLACE) 100 MG capsule Take 1 capsule (100 mg total) by mouth 2 (two) times daily.  Marland Kitchen EPINEPHrine 0.3 mg/0.3 mL IJ SOAJ injection Inject 0.3 mLs (0.3 mg total) into the muscle as needed for anaphylaxis.  . ferric citrate (AURYXIA) 1 GM 210 MG(Fe) tablet Take 210-420 mg by mouth See admin instructions. Take 2 tablets (420 mg) by mouth with each meal & take 1 tablet (210 mg) by mouth with snacks.  . fluticasone (FLONASE) 50 MCG/ACT nasal spray Place  2 sprays into both nostrils daily as needed for allergies or rhinitis.  Marland Kitchen gabapentin (NEURONTIN) 100 MG capsule TAKE 1 CAPSULE (100 MG TOTAL) BY MOUTH AT BEDTIME. WHEN NECESSARY FOR NEUROPATHY PAIN (Patient taking differently: Take 100 mg by mouth at bedtime as needed (pain). When necessary for neuropathy pain)  . glipiZIDE (GLUCOTROL) 5 MG tablet Take 5 mg by mouth daily before breakfast.  . lidocaine-prilocaine (EMLA) cream Apply 1 application topically 3 (three) times a week. 1-2 hours prior to dialysis  . loperamide (IMODIUM) 2 MG capsule  Take 2 mg by mouth every 4 (four) hours as needed (diarrhea).  . montelukast (SINGULAIR) 10 MG tablet TAKE 1 TABLET BY MOUTH EVERYDAY AT BEDTIME  . multivitamin (RENA-VIT) TABS tablet Take 1 tablet by mouth daily.   Marland Kitchen omeprazole (PRILOSEC) 20 MG capsule Take 20 mg by mouth daily.   . ondansetron (ZOFRAN) 4 MG tablet Take 1 tablet (4 mg total) by mouth every 6 (six) hours as needed for nausea.  Marland Kitchen oxyCODONE-acetaminophen (PERCOCET) 10-325 MG tablet Take 1 tablet by mouth every 6 (six) hours as needed for pain.  . polyethylene glycol (MIRALAX / GLYCOLAX) 17 g packet Take 17 g by mouth daily.  . traZODone (DESYREL) 50 MG tablet Take 50 mg by mouth at bedtime as needed for sleep.  . Vitamin D, Ergocalciferol, (DRISDOL) 1.25 MG (50000 UT) CAPS capsule Take 50,000 Units by mouth every Sunday. Takes on Sunday   No current facility-administered medications for this visit. (Other)      REVIEW OF SYSTEMS: ROS    Positive for: Endocrine   Last edited by Tilda Franco on 10/03/2020  2:23 PM. (History)       ALLERGIES Allergies  Allergen Reactions  . Adhesive  [Tape] Hives  . Lisinopril Cough  . Prednisone Swelling and Other (See Comments)    Excessive fluid buildup    PAST MEDICAL HISTORY Past Medical History:  Diagnosis Date  . Abdominal bruit   . Anemia   . Anxiety   . Arthritis    Osteoarthritis  . Asthma   . Cervical disc disease    "pinced nerve"  . CHF (congestive heart failure) ([place])   . Complication of anesthesia    " after I got home from my last procedure, I started itching."  . Diabetes mellitus    Type II  . Diverticulitis   . ESRD (end stage renal disease) ([place])    dialysis - M/W/F- [place]  . GERD (gastroesophageal reflux disease)    from medications  . GI bleed 03/31/2013  . Head injury 07/2017  . History of hiatal hernia   . Hyperlipidemia   . Hypertension   . Neuropathy    left leg  . Osteoporosis   . Peripheral vascular disease ([place])   . Pneumonia     "very young" and a few years ago  . PONV (postoperative nausea and vomiting)   . Seasonal allergies   . Shortness of breath dyspnea    WIth exertion, when fluid builds  . Sleep apnea    can't afford cpap    Past Surgical History:  Procedure Laterality Date  . A/V SHUNTOGRAM N/A 09/22/2016   Procedure: A/V Shuntogram - left arm;  Surgeon: Serafina Mitchell, MD;  Location: [facility];  Service: Cardiovascular;  Laterality: N/A;  . A/V SHUNTOGRAM N/A 03/22/2018   Procedure: A/V SHUNTOGRAM - left arm;  Surgeon: Serafina Mitchell, MD;  Location: [facility];  Service: Cardiovascular;  Laterality: N/A;  . A/V SHUNTOGRAM Left 11/17/2018   Procedure: A/V SHUNTOGRAM;  Surgeon: Angelia Mould, MD;  Location: [facility];  Service: Cardiovascular;  Laterality: Left;  . ABDOMINAL HYSTERECTOMY  1993`  . AMPUTATION Left 09/01/2016   Procedure: LEFT FOOT TRANSMETATARSAL AMPUTATION;  Surgeon: Newt Minion, MD;  Location: [facility];  Service: Orthopedics;  Laterality: Left;  . AMPUTATION Right 08/11/2017   Procedure: RIGHT GREAT TOE AMPUTATION DIGIT;  Surgeon: Rosetta Posner, MD;  Location: [facility];  Service: Vascular;  Laterality: Right;  . AMPUTATION Right 12/31/2017   Procedure: RIGHT TRANSMETATARSAL AMPUTATION;  Surgeon: Newt Minion, MD;  Location: [facility];  Service: Orthopedics;  Laterality: Right;  . AV FISTULA PLACEMENT Left 04/21/2016   Procedure: INSERTION OF ARTERIOVENOUS (AV) GORE-TEX GRAFT ARM LEFT;  Surgeon: Elam Dutch, MD;  Location: [place];  Service: Vascular;  Laterality: Left;  . [place] TRANSPOSITION Left 07/10/2014   Procedure: BASCILIC VEIN TRANSPOSITION;  Surgeon: Angelia Mould, MD;  Location: [place] [place];  Service: Vascular;  Laterality: Left;  . BASCILIC VEIN TRANSPOSITION Right 11/08/2014   Procedure: FIRST STAGE BASILIC VEIN TRANSPOSITION;  Surgeon: Angelia Mould, MD;  Location: [facility];  Service: Vascular;  Laterality: Right;  . BASCILIC VEIN  TRANSPOSITION Right 01/18/2015   Procedure: SECOND STAGE BASILIC VEIN TRANSPOSITION;  Surgeon: Angelia Mould, MD;  Location: [facility] [facility];  Service: Vascular;  Laterality: Right;  . CRANIOTOMY N/A 08/23/2017   Procedure: CRANIOTOMY HEMATOMA EVACUATION SUBDURAL;  Surgeon: Ashok Pall, MD;  Location: [facility];  Service: Neurosurgery;  Laterality: N/A;  . CRANIOTOMY Left 08/24/2017   Procedure: CRANIOTOMY FOR RECURRENT ACUTE SUBDURAL HEMATOMA;  Surgeon: Ashok Pall, MD;  Location: [place] [facility];  Service: Neurosurgery;  Laterality: Left;  . ESOPHAGOGASTRODUODENOSCOPY N/A 03/31/2013   Procedure: ESOPHAGOGASTRODUODENOSCOPY (EGD);  Surgeon: Gatha Mayer, MD;  [facility] ENDOS[facility];  Service: Endoscopy;  Laterality: N/A;  . EYE SURGERY     laser surgery  . FEMORAL-POPLITEAL BYPASS GRAFT Left 07/08/2016   Procedure: LEFT  FEMORAL-BELOW KNEE POPLITEAL ARTERY BYPASS GRAFT USING 6MM X 80 CM PROPATEN GORETEX GRAFT WITH RINGS.;  Surgeon: Rosetta Posner, MD;  Location: [place];  Service: Vascular;  Laterality: Left;  . FEMORAL-POPLITEAL BYPASS GRAFT Right 08/11/2017   Procedure: RIGHT FEMORAL TO BELOW KNEE POPLITEAL ARTERKY  BYPASS GRAFT USING 6MM RINGED PROPATEN GRAFT;  Surgeon: Rosetta Posner, MD;  Location: [facility];  Service: Vascular;  Laterality: Right;  . FISTULOGRAM Left 10/29/2014   Procedure: FISTULOGRAM;  Surgeon: Angelia Mould, MD;  Location: [facility] CATH LAB;  Service: Cardiovascular;  Laterality: Left;  Marland Kitchen GAS/FLUID EXCHANGE Left 12/20/2018   Procedure: AIR GAS EXCHANGE;  Surgeon: Jalene Mullet, MD;  Location: [place];  Service: Ophthalmology;  Laterality: Left;  . INSERTION OF DIALYSIS CATHETER N/A 09/05/2020   Procedure: INSERTION OF DIALYSIS CATHETER;  Surgeon: Cherre Robins, MD;  Location: [facility];  Service: Vascular;  Laterality: N/A;  . KNEE ARTHROSCOPY Right 05/06/2018   Procedure: RIGHT KNEE ARTHROSCOPY;  Surgeon: Newt Minion, MD;  Location: [facility];  Service: Orthopedics;  Laterality: Right;  . KNEE  ARTHROSCOPY Right 06/10/2018   Procedure: RIGHT KNEE ARTHROSCOPY AND DEBRIDEMENT;  Surgeon: Newt Minion, MD;  Location: [facility] [place];  Service: Orthopedics;  Laterality: Right;  . LIGATION OF ARTERIOVENOUS  FISTULA Left 04/21/2016   Procedure: LIGATION OF ARTERIOVENOUS  FISTULA LEFT ARM;  Surgeon: Elam Dutch, MD;  Location: [place];  Service: Vascular;  Laterality: Left;  . LOWER EXTREMITY ANGIOGRAPHY N/A 04/06/2017   Procedure: Lower Extremity Angiography - Right;  Surgeon: Serafina Mitchell, MD;  Location: [facility];  Service: Cardiovascular;  Laterality: N/A;  . MEMBRANE PEEL Left 12/20/2018   Procedure: MEMBRANE PEEL;  Surgeon: Jalene Mullet, MD;  Location: [facility];  Service: Ophthalmology;  Laterality: Left;  . PATCH ANGIOPLASTY Right 01/18/2015   Procedure: BASILIC VEIN PATCH ANGIOPLASTY USING VASCUGUARD PATCH;  Surgeon: Angelia Mould, MD;  Location: [place];  Service: Vascular;  Laterality: Right;  . PERIPHERAL VASCULAR BALLOON ANGIOPLASTY  09/22/2016   Procedure: Peripheral Vascular Balloon Angioplasty;  Surgeon: Serafina Mitchell, MD;  Location: [place] [facility];  Service: Cardiovascular;;  Lt. Fistula  . PERIPHERAL VASCULAR BALLOON ANGIOPLASTY Left 03/22/2018   Procedure: PERIPHERAL VASCULAR BALLOON ANGIOPLASTY;  Surgeon: Serafina Mitchell, MD;  Location: [facility];  Service: Cardiovascular;  Laterality: Left;  Arm shunt  . PERIPHERAL VASCULAR CATHETERIZATION N/A 06/23/2016   Procedure: Abdominal Aortogram w/Lower Extremity;  Surgeon: Serafina Mitchell, MD;  Location: [place] [facility];  Service: Cardiovascular;  Laterality: N/A;  . PERIPHERAL VASCULAR CATHETERIZATION  06/23/2016   Procedure: Peripheral Vascular Intervention;  Surgeon: Serafina Mitchell, MD;  Location: [facility] [facility];  Service: Cardiovascular;;  lt common and external illiac artery  . PHOTOCOAGULATION WITH LASER Left 12/20/2018   Procedure: PHOTOCOAGULATION WITH LASER;  Surgeon: Jalene Mullet, MD;   Location: [facility];  Service: Ophthalmology;  Laterality: Left;  . 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   Procedure: REPAIR OF COMPLEX TRACTION RETINAL DETACHMENT;  Surgeon: Jalene Mullet, MD;  Location: [place];  Service: Ophthalmology;  Laterality: Left;  . REVISION OF ARTERIOVENOUS GORETEX GRAFT Left 09/05/2020   Procedure: REVISION OF ARTERIOVENOUS GORETEX GRAFT LEFT;  Surgeon: Cherre Robins, MD;  Location: [facility] OR;  Service: Vascular;  Laterality: Left;    FAMILY HISTORY Family History  Problem Relation Age of Onset  . Other Mother        not sure of cause of death  . Diabetes Father   . Pancreatic cancer Maternal Grandmother   . Diabetes Sister   . Diabetes Sister   . Colon cancer Neg Hx     SOCIAL HISTORY Social History   Tobacco Use  . Smoking status: Former Smoker    Packs/day: 0.35    Years: 40.00    Pack years: 14.00    Types: Cigarettes    Quit date: 05/10/2012    Years since quitting: 8.4  . Smokeless tobacco: Never Used  Vaping Use  . Vaping Use: Never used  Substance Use Topics  . Alcohol use: No  . Drug use: No    Comment: marijuana; quit in early 1980's         OPHTHALMIC EXAM:  Base Eye Exam    Visual Acuity (Snellen - Linear)      Right Left   Dist Gideon 20/40 -2 20/40 -2   Dist ph  NI 20/40 +2       Tonometry (Tonopen, 2:31 PM)      Right Left   Pressure 17 17       Pupils      Pupils Dark Light Shape React APD   Right PERRL 3 3 Round Brisk None   Left PERRL 3 3 Round Brisk None       Visual Fields (Counting fingers)      Left Right    Full  Restrictions  Partial outer inferior nasal deficiency       Neuro/Psych    Oriented x3: Yes   Mood/Affect: Normal       Dilation    Both eyes: 1.0% Mydriacyl, 2.5% Phenylephrine @ 2:31 PM        Slit Lamp and Fundus Exam    External Exam      Right Left   External Normal Normal       Slit Lamp Exam      Right Left   Lids/Lashes Normal Normal    Conjunctiva/Sclera White and quiet White and quiet   Cornea Clear Clear   Anterior Chamber Deep and quiet Deep and quiet   Iris Round and reactive Round and reactive   Lens Centered posterior chamber intraocular lens Centered posterior chamber intraocular lens   Anterior Vitreous Normal Normal, remnant       Fundus Exam      Right Left   Posterior Vitreous Pre-retinal hemorrhage Vitrectomized, by history   Disc Neovascularization No details through cataract   C/D Ratio 0.5 0.3   Macula Microaneurysms CSME at and temporal to the fovea   Vessels PDR-active with NVE inferior to nerve NVE temporal portion of macula Good PRP, active NVE temporally and inferiorly   Periphery  moderate scatter  attached peripherally          IMAGING AND PROCEDURES  Imaging and Procedures for 10/03/20  OCT, Retina - OU - Both Eyes       Right Eye Quality was good. Scan locations included subfoveal. Central Foveal Thickness: 330. Progression has improved. Findings include vitreous traction, vitreomacular adhesion , abnormal foveal contour, cystoid macular edema.   Left Eye Quality was good. Scan locations included subfoveal. Central Foveal Thickness: 319. Progression has been stable. Findings include abnormal foveal contour, cystoid macular edema.   Notes Quiescent proliferative diabetic retinopathy exist OU we will continue to monitor.  Vitreomacular traction with less CSME OD  OS with CSME particularly temporal to the fovea.                ASSESSMENT/PLAN:  Vitreomacular traction syndrome, right Traction from proliferative disease notable in persistent temporal to the macula OD  Diabetic macular edema of right eye with proliferative retinopathy associated with type 2 diabetes mellitus ([place]) In the near future will need vitrectomy to release the tractional changes in the right eye and potential injection intravitreal Avastin right eye  Proliferative diabetic retinopathy of left eye  with macular edema associated with type 2 diabetes mellitus ([place]) CSME temporal to the macula still active and massive we will continue with treatment left eye today      ICD-10-CM   1. 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 type 2 diabetes mellitus (HCC)  Q25.9563 OCT, Retina - OU - Both Eyes  2. Vitreomacular traction syndrome, right  H43.821   3. Diabetic macular edema of right eye with proliferative retinopathy associated with type 2 diabetes mellitus ([place])  E11.3511     1.  CSME OS, still active will read need to retreat today  2.  Right eye,  has vitreomacular traction and CSME and neovascularization active temporally will need action of from PDR progression with intravitreal Avastin soon right eye, dilate OD in 3 weeks  3.  OS dilate again in 5 weeks  Ophthalmic Meds Ordered this visit:  No orders of the defined types were placed in this encounter.      Return in about 3  weeks (around 10/24/2020) for dilate, OD, AVASTIN OCT, COLOR FP.  There are no Patient Instructions on file for this visit.   Explained the diagnoses, plan, and follow up with the patient and they expressed understanding.  Patient expressed understanding of the importance of proper follow up care.   Clent Demark Ninamarie Keel M.D. Diseases & Surgery of the Retina and Vitreous Retina & Diabetic [place] 10/03/20     Abbreviations: M myopia (nearsighted); A astigmatism; H hyperopia (farsighted); P presbyopia; Mrx spectacle prescription;  CTL contact lenses; OD right eye; OS left eye; OU both eyes  XT exotropia; ET esotropia; PEK punctate epithelial keratitis; PEE punctate epithelial erosions; DES dry eye syndrome; MGD meibomian gland dysfunction; ATs artificial tears; PFAT's preservative free artificial tears; [place] nuclear sclerotic cataract; PSC posterior subcapsular cataract; ERM epi-retinal membrane; PVD posterior vitreous detachment; RD retinal detachment; DM diabetes  mellitus; DR diabetic retinopathy; NPDR non-proliferative diabetic retinopathy; PDR proliferative diabetic retinopathy; CSME clinically significant macular edema; DME diabetic macular edema; dbh dot blot hemorrhages; CWS cotton wool spot; POAG primary open angle glaucoma; C/D cup-to-disc ratio; HVF humphrey visual field; GVF goldmann visual field; OCT optical coherence tomography; IOP intraocular pressure; BRVO Branch retinal vein occlusion; CRVO central retinal vein occlusion; CRAO central retinal artery occlusion; BRAO branch retinal artery occlusion; RT retinal tear; SB scleral buckle; PPV pars plana vitrectomy; VH Vitreous hemorrhage; PRP panretinal laser photocoagulation; IVK intravitreal kenalog; VMT vitreomacular traction; MH Macular hole;  NVD neovascularization of the disc; NVE neovascularization elsewhere; AREDS age related eye disease study; ARMD age related macular degeneration; POAG primary open angle glaucoma; EBMD epithelial/anterior basement membrane dystrophy; ACIOL anterior chamber intraocular lens; IOL intraocular lens; PCIOL posterior chamber intraocular lens; Phaco/IOL phacoemulsification with intraocular lens placement; [place] photorefractive keratectomy; LASIK laser assisted in situ keratomileusis; HTN hypertension; DM diabetes mellitus; COPD chronic obstructive pulmonary disease

## 2020-10-03 NOTE — Assessment & Plan Note (Signed)
In the near future will need vitrectomy to release the tractional changes in the right eye and potential injection intravitreal Avastin right eye

## 2020-10-03 NOTE — Patient Outreach (Signed)
[facility]) Care Management  10/03/2020  KIMBLERY DIOP 1950/09/25 [id number]  Unsuccessful outreach attempt made to patient. RN Health Coach left HIPAA compliant voicemail message along with her contact information.  Plan: RN Health Coach will call patient within the month of April.  Emelia Loron RN, BSN [place] [phone] [email or web address]

## 2020-10-04 DIAGNOSIS — E876 Hypokalemia: Secondary | ICD-10-CM | POA: Diagnosis not present

## 2020-10-04 DIAGNOSIS — D509 Iron deficiency anemia, unspecified: Secondary | ICD-10-CM | POA: Diagnosis not present

## 2020-10-04 DIAGNOSIS — N2581 Secondary hyperparathyroidism of renal origin: Secondary | ICD-10-CM | POA: Diagnosis not present

## 2020-10-04 DIAGNOSIS — N186 End stage renal disease: Secondary | ICD-10-CM | POA: Diagnosis not present

## 2020-10-04 DIAGNOSIS — D631 Anemia in chronic kidney disease: Secondary | ICD-10-CM | POA: Diagnosis not present

## 2020-10-04 DIAGNOSIS — Z992 Dependence on renal dialysis: Secondary | ICD-10-CM | POA: Diagnosis not present

## 2020-10-07 DIAGNOSIS — N186 End stage renal disease: Secondary | ICD-10-CM | POA: Diagnosis not present

## 2020-10-07 DIAGNOSIS — E876 Hypokalemia: Secondary | ICD-10-CM | POA: Diagnosis not present

## 2020-10-07 DIAGNOSIS — N2581 Secondary hyperparathyroidism of renal origin: Secondary | ICD-10-CM | POA: Diagnosis not present

## 2020-10-07 DIAGNOSIS — Z992 Dependence on renal dialysis: Secondary | ICD-10-CM | POA: Diagnosis not present

## 2020-10-07 DIAGNOSIS — D509 Iron deficiency anemia, unspecified: Secondary | ICD-10-CM | POA: Diagnosis not present

## 2020-10-07 DIAGNOSIS — D631 Anemia in chronic kidney disease: Secondary | ICD-10-CM | POA: Diagnosis not present

## 2020-10-08 ENCOUNTER — Ambulatory Visit (INDEPENDENT_AMBULATORY_CARE_PROVIDER_SITE_OTHER): Payer: Medicare Other | Admitting: *Deleted

## 2020-10-08 DIAGNOSIS — J309 Allergic rhinitis, unspecified: Secondary | ICD-10-CM

## 2020-10-09 DIAGNOSIS — N2581 Secondary hyperparathyroidism of renal origin: Secondary | ICD-10-CM | POA: Diagnosis not present

## 2020-10-09 DIAGNOSIS — N186 End stage renal disease: Secondary | ICD-10-CM | POA: Diagnosis not present

## 2020-10-09 DIAGNOSIS — D631 Anemia in chronic kidney disease: Secondary | ICD-10-CM | POA: Diagnosis not present

## 2020-10-09 DIAGNOSIS — E876 Hypokalemia: Secondary | ICD-10-CM | POA: Diagnosis not present

## 2020-10-09 DIAGNOSIS — Z992 Dependence on renal dialysis: Secondary | ICD-10-CM | POA: Diagnosis not present

## 2020-10-09 DIAGNOSIS — D509 Iron deficiency anemia, unspecified: Secondary | ICD-10-CM | POA: Diagnosis not present

## 2020-10-11 DIAGNOSIS — N2581 Secondary hyperparathyroidism of renal origin: Secondary | ICD-10-CM | POA: Diagnosis not present

## 2020-10-11 DIAGNOSIS — D509 Iron deficiency anemia, unspecified: Secondary | ICD-10-CM | POA: Diagnosis not present

## 2020-10-11 DIAGNOSIS — E876 Hypokalemia: Secondary | ICD-10-CM | POA: Diagnosis not present

## 2020-10-11 DIAGNOSIS — Z992 Dependence on renal dialysis: Secondary | ICD-10-CM | POA: Diagnosis not present

## 2020-10-11 DIAGNOSIS — N186 End stage renal disease: Secondary | ICD-10-CM | POA: Diagnosis not present

## 2020-10-11 DIAGNOSIS — D631 Anemia in chronic kidney disease: Secondary | ICD-10-CM | POA: Diagnosis not present

## 2020-10-14 DIAGNOSIS — N186 End stage renal disease: Secondary | ICD-10-CM | POA: Diagnosis not present

## 2020-10-14 DIAGNOSIS — E876 Hypokalemia: Secondary | ICD-10-CM | POA: Diagnosis not present

## 2020-10-14 DIAGNOSIS — N2581 Secondary hyperparathyroidism of renal origin: Secondary | ICD-10-CM | POA: Diagnosis not present

## 2020-10-14 DIAGNOSIS — D631 Anemia in chronic kidney disease: Secondary | ICD-10-CM | POA: Diagnosis not present

## 2020-10-14 DIAGNOSIS — D509 Iron deficiency anemia, unspecified: Secondary | ICD-10-CM | POA: Diagnosis not present

## 2020-10-14 DIAGNOSIS — Z992 Dependence on renal dialysis: Secondary | ICD-10-CM | POA: Diagnosis not present

## 2020-10-15 ENCOUNTER — Ambulatory Visit (INDEPENDENT_AMBULATORY_CARE_PROVIDER_SITE_OTHER): Payer: Medicare Other | Admitting: *Deleted

## 2020-10-15 ENCOUNTER — Other Ambulatory Visit: Payer: Self-pay

## 2020-10-15 ENCOUNTER — Ambulatory Visit
Admission: RE | Admit: 2020-10-15 | Discharge: 2020-10-15 | Disposition: A | Discharge status: Home / Self Care | Payer: Medicare Other | Source: Ambulatory Visit | Attending: Student | Admitting: Student

## 2020-10-15 ENCOUNTER — Other Ambulatory Visit: Payer: Self-pay | Admitting: Student

## 2020-10-15 DIAGNOSIS — N200 Calculus of kidney: Secondary | ICD-10-CM | POA: Diagnosis not present

## 2020-10-15 DIAGNOSIS — J309 Allergic rhinitis, unspecified: Secondary | ICD-10-CM

## 2020-10-15 DIAGNOSIS — K8080 Other cholelithiasis without obstruction: Secondary | ICD-10-CM | POA: Diagnosis not present

## 2020-10-15 DIAGNOSIS — R110 Nausea: Secondary | ICD-10-CM

## 2020-10-16 DIAGNOSIS — E876 Hypokalemia: Secondary | ICD-10-CM | POA: Diagnosis not present

## 2020-10-16 DIAGNOSIS — Z992 Dependence on renal dialysis: Secondary | ICD-10-CM | POA: Diagnosis not present

## 2020-10-16 DIAGNOSIS — N2581 Secondary hyperparathyroidism of renal origin: Secondary | ICD-10-CM | POA: Diagnosis not present

## 2020-10-16 DIAGNOSIS — N186 End stage renal disease: Secondary | ICD-10-CM | POA: Diagnosis not present

## 2020-10-16 DIAGNOSIS — D509 Iron deficiency anemia, unspecified: Secondary | ICD-10-CM | POA: Diagnosis not present

## 2020-10-16 DIAGNOSIS — D631 Anemia in chronic kidney disease: Secondary | ICD-10-CM | POA: Diagnosis not present

## 2020-10-18 DIAGNOSIS — Z992 Dependence on renal dialysis: Secondary | ICD-10-CM | POA: Diagnosis not present

## 2020-10-18 DIAGNOSIS — N186 End stage renal disease: Secondary | ICD-10-CM | POA: Diagnosis not present

## 2020-10-18 DIAGNOSIS — E876 Hypokalemia: Secondary | ICD-10-CM | POA: Diagnosis not present

## 2020-10-18 DIAGNOSIS — D631 Anemia in chronic kidney disease: Secondary | ICD-10-CM | POA: Diagnosis not present

## 2020-10-18 DIAGNOSIS — D509 Iron deficiency anemia, unspecified: Secondary | ICD-10-CM | POA: Diagnosis not present

## 2020-10-18 DIAGNOSIS — N2581 Secondary hyperparathyroidism of renal origin: Secondary | ICD-10-CM | POA: Diagnosis not present

## 2020-10-19 ENCOUNTER — Other Ambulatory Visit: Payer: Self-pay

## 2020-10-19 DIAGNOSIS — N186 End stage renal disease: Secondary | ICD-10-CM

## 2020-10-21 DIAGNOSIS — N2581 Secondary hyperparathyroidism of renal origin: Secondary | ICD-10-CM | POA: Diagnosis not present

## 2020-10-21 DIAGNOSIS — D631 Anemia in chronic kidney disease: Secondary | ICD-10-CM | POA: Diagnosis not present

## 2020-10-21 DIAGNOSIS — D509 Iron deficiency anemia, unspecified: Secondary | ICD-10-CM | POA: Diagnosis not present

## 2020-10-21 DIAGNOSIS — N186 End stage renal disease: Secondary | ICD-10-CM | POA: Diagnosis not present

## 2020-10-21 DIAGNOSIS — Z992 Dependence on renal dialysis: Secondary | ICD-10-CM | POA: Diagnosis not present

## 2020-10-21 DIAGNOSIS — E876 Hypokalemia: Secondary | ICD-10-CM | POA: Diagnosis not present

## 2020-10-22 ENCOUNTER — Ambulatory Visit (INDEPENDENT_AMBULATORY_CARE_PROVIDER_SITE_OTHER): Payer: Medicare Other | Admitting: *Deleted

## 2020-10-22 ENCOUNTER — Ambulatory Visit: Payer: Medicare Other | Admitting: Allergy & Immunology

## 2020-10-22 DIAGNOSIS — J309 Allergic rhinitis, unspecified: Secondary | ICD-10-CM | POA: Diagnosis not present

## 2020-10-23 DIAGNOSIS — D631 Anemia in chronic kidney disease: Secondary | ICD-10-CM | POA: Diagnosis not present

## 2020-10-23 DIAGNOSIS — N186 End stage renal disease: Secondary | ICD-10-CM | POA: Diagnosis not present

## 2020-10-23 DIAGNOSIS — E876 Hypokalemia: Secondary | ICD-10-CM | POA: Diagnosis not present

## 2020-10-23 DIAGNOSIS — Z992 Dependence on renal dialysis: Secondary | ICD-10-CM | POA: Diagnosis not present

## 2020-10-23 DIAGNOSIS — D509 Iron deficiency anemia, unspecified: Secondary | ICD-10-CM | POA: Diagnosis not present

## 2020-10-23 DIAGNOSIS — N2581 Secondary hyperparathyroidism of renal origin: Secondary | ICD-10-CM | POA: Diagnosis not present

## 2020-10-24 ENCOUNTER — Ambulatory Visit (INDEPENDENT_AMBULATORY_CARE_PROVIDER_SITE_OTHER): Payer: Medicare Other | Admitting: Physician Assistant

## 2020-10-24 ENCOUNTER — Other Ambulatory Visit: Payer: Self-pay

## 2020-10-24 ENCOUNTER — Ambulatory Visit (HOSPITAL_COMMUNITY)
Admission: RE | Admit: 2020-10-24 | Discharge: 2020-10-24 | Disposition: A | Discharge status: Home / Self Care | Payer: Medicare Other | Source: Ambulatory Visit | Attending: Vascular Surgery | Admitting: Vascular Surgery

## 2020-10-24 VITALS — BP 209/85 | HR 92 | Temp 98.0°F | Resp 20 | Ht 65.0 in | Wt 151.4 lb

## 2020-10-24 DIAGNOSIS — N186 End stage renal disease: Secondary | ICD-10-CM | POA: Diagnosis not present

## 2020-10-24 DIAGNOSIS — Z992 Dependence on renal dialysis: Secondary | ICD-10-CM

## 2020-10-24 NOTE — Progress Notes (Signed)
    Postoperative Access Visit   History of Present Illness   Destiny Day is a 70 y.o. year old female who presents for postoperative follow-up for: left AV graft revision by Dr. Stanford Breed (Date: 09/05/20).  The patient's wounds are healed.  The patient denies steal symptoms.  The patient is able to complete their activities of daily living.  She is currently dialyzing via left IJ TDC which was exchanged by [facility] in the last few weeks.  She dialyzes at the [place] [facility] [place] location on a Monday Wednesday Friday schedule.  Physical Examination   Vitals:   10/24/20 1501  BP: (!) 209/85  Pulse: 92  Resp: 20  Temp: 98 F (36.7 C)  TempSrc: Temporal  SpO2: 95%  Weight: 151 lb 6.4 oz (68.7 kg)  Height: 5\' 5"  (1.651 m)   Body mass index is 25.19 kg/m.  left arm Incision is healed, palpable radial pulse, hand grip is 5/5, sensation in digits is intact, palpable thrill, bruit can be auscultated     Medical Decision Making   RICKEYA MANUS is a 70 y.o. year old female who presents s/p left Revision of left arm AV graft   Patent L arm AVG without signs or symptoms of steal syndrome  The patient's access will be ready for use 10/28/20  The patient's tunneled dialysis catheter can be removed when Nephrology is comfortable with the performance of the graft  The patient may follow up on a prn basis   Dagoberto Ligas PA-C [facility] Office: [phone]  Clinic MD: Oneida Alar

## 2020-10-25 DIAGNOSIS — N186 End stage renal disease: Secondary | ICD-10-CM | POA: Diagnosis not present

## 2020-10-25 DIAGNOSIS — D509 Iron deficiency anemia, unspecified: Secondary | ICD-10-CM | POA: Diagnosis not present

## 2020-10-25 DIAGNOSIS — E876 Hypokalemia: Secondary | ICD-10-CM | POA: Diagnosis not present

## 2020-10-25 DIAGNOSIS — D631 Anemia in chronic kidney disease: Secondary | ICD-10-CM | POA: Diagnosis not present

## 2020-10-25 DIAGNOSIS — I159 Secondary hypertension, unspecified: Secondary | ICD-10-CM | POA: Diagnosis not present

## 2020-10-25 DIAGNOSIS — Z992 Dependence on renal dialysis: Secondary | ICD-10-CM | POA: Diagnosis not present

## 2020-10-25 DIAGNOSIS — N2581 Secondary hyperparathyroidism of renal origin: Secondary | ICD-10-CM | POA: Diagnosis not present

## 2020-10-28 DIAGNOSIS — N186 End stage renal disease: Secondary | ICD-10-CM | POA: Diagnosis not present

## 2020-10-28 DIAGNOSIS — Z992 Dependence on renal dialysis: Secondary | ICD-10-CM | POA: Diagnosis not present

## 2020-10-28 DIAGNOSIS — N2581 Secondary hyperparathyroidism of renal origin: Secondary | ICD-10-CM | POA: Diagnosis not present

## 2020-10-28 DIAGNOSIS — E876 Hypokalemia: Secondary | ICD-10-CM | POA: Diagnosis not present

## 2020-10-28 DIAGNOSIS — D509 Iron deficiency anemia, unspecified: Secondary | ICD-10-CM | POA: Diagnosis not present

## 2020-10-28 DIAGNOSIS — D631 Anemia in chronic kidney disease: Secondary | ICD-10-CM | POA: Diagnosis not present

## 2020-10-29 ENCOUNTER — Ambulatory Visit (INDEPENDENT_AMBULATORY_CARE_PROVIDER_SITE_OTHER): Payer: Medicare Other | Admitting: *Deleted

## 2020-10-29 ENCOUNTER — Other Ambulatory Visit: Payer: Self-pay

## 2020-10-29 ENCOUNTER — Ambulatory Visit (INDEPENDENT_AMBULATORY_CARE_PROVIDER_SITE_OTHER): Payer: Medicare Other | Admitting: Ophthalmology

## 2020-10-29 ENCOUNTER — Other Ambulatory Visit: Payer: Self-pay | Admitting: *Deleted

## 2020-10-29 ENCOUNTER — Encounter (INDEPENDENT_AMBULATORY_CARE_PROVIDER_SITE_OTHER): Payer: Self-pay | Admitting: Ophthalmology

## 2020-10-29 DIAGNOSIS — H35371 Puckering of macula, right eye: Secondary | ICD-10-CM

## 2020-10-29 DIAGNOSIS — J309 Allergic rhinitis, unspecified: Secondary | ICD-10-CM | POA: Diagnosis not present

## 2020-10-29 DIAGNOSIS — E113511 Type 2 diabetes mellitus with proliferative diabetic retinopathy with macular edema, right eye: Secondary | ICD-10-CM

## 2020-10-29 MED ORDER — BEVACIZUMAB 2.5 MG/0.1ML IZ SOSY
2.5000 mg | PREFILLED_SYRINGE | INTRAVITREAL | Status: AC | PRN
  Administered 2020-10-29: 2.5 mg via INTRAVITREAL

## 2020-10-29 NOTE — Progress Notes (Signed)
10/29/2020     CHIEF COMPLAINT Patient presents for Retina Follow Up (3 Wk F/U OD, poss Avastin OD//Pt denies noticeable changes to [place] OU since last visit. Pt denies ocular pain or FOL OU. Pt c/o increased floaters OD x 3 weeks.//)   HISTORY OF PRESENT ILLNESS: Destiny Day is a 70 y.o. female who presents to the clinic today for:   HPI    Retina Follow Up    Patient presents with  Diabetic Retinopathy.  In right eye.  This started 3 weeks ago.  Severity is mild.  Duration of 3 weeks.  Since onset it is stable. Additional comments: 3 Wk F/U OD, poss Avastin OD  Pt denies noticeable changes to [place] OU since last visit. Pt denies ocular pain or FOL OU. Pt c/o increased floaters OD x 3 weeks.         Last edited by Rockie Neighbours, [place] on 10/29/2020  2:06 PM. (History)      Referring physician: Cipriano Mile, NP [place],  [place] [address]  HISTORICAL INFORMATION:   Selected notes from the MEDICAL RECORD NUMBER    Lab Results  Component Value Date   HGBA1C 5.6 06/26/2019     CURRENT MEDICATIONS: No current outpatient medications on file. (Ophthalmic Drugs)   No current facility-administered medications for this visit. (Ophthalmic Drugs)   Current Outpatient Medications (Other)  Medication Sig  . albuterol (PROAIR HFA) 108 (90 Base) MCG/ACT inhaler 4 puffs every 4-6 hours as needed (Patient taking differently: Inhale 4 puffs into the lungs every 4 (four) hours as needed for wheezing or shortness of breath. 4 puffs every 4-6 hours as needed)  . amLODipine (NORVASC) 10 MG tablet Take 10 mg by mouth at bedtime.  Marland Kitchen aspirin EC 81 MG tablet Take 81 mg by mouth daily.  Marland Kitchen atorvastatin (LIPITOR) 40 MG tablet Take 1 tablet (40 mg total) by mouth daily.  Marland Kitchen azelastine (ASTELIN) 0.1 % nasal spray Place 1 spray into both nostrils 2 (two) times daily.  Marland Kitchen BIDIL 20-37.5 MG tablet TAKE ONE TABLET BY MOUTH THREE TIMES A DAY  (Patient taking differently: Take 1 tablet by  mouth 3 (three) times daily.)  . budesonide-formoterol (SYMBICORT) 160-4.5 MCG/ACT inhaler 2 puffs twice daily with spacer (Patient taking differently: Inhale into the lungs in the morning and at bedtime. 2 puffs twice daily with spacer)  . busPIRone (BUSPAR) 7.5 MG tablet Take 7.5 mg by mouth 2 (two) times daily.  . carvedilol (COREG) 12.5 MG tablet Take 12.5 mg by mouth See admin instructions. Take 1 tablet (12.5 mg) by mouth once daily in the evening on Mondays, Wednesdays, & Fridays. (Dialysis) Take 1 tablet (12.5 mg) by mouth twice daily on Sundays, Tuesdays, Thursdays, & Saturdays. (Non Dialysis)  . cinacalcet (SENSIPAR) 60 MG tablet Take 90 mg by mouth See admin instructions. Take 1 tablet (90 mg) by mouth in the evening on Mondays, Wednesdays, & Fridays (dialysis) Take 1 tablet (90 mg) by mouth twice daily on Sundays, Tuesdays, Thursdays, Saturdays. (non dialysis)  . docusate sodium (COLACE) 100 MG capsule Take 1 capsule (100 mg total) by mouth 2 (two) times daily.  Marland Kitchen EPINEPHrine 0.3 mg/0.3 mL IJ SOAJ injection Inject 0.3 mLs (0.3 mg total) into the muscle as needed for anaphylaxis.  . ferric citrate (AURYXIA) 1 GM 210 MG(Fe) tablet Take 210-420 mg by mouth See admin instructions. Take 2 tablets (420 mg) by mouth with each meal & take 1 tablet (210 mg) by mouth  with snacks.  . fluticasone (FLONASE) 50 MCG/ACT nasal spray Place 2 sprays into both nostrils daily as needed for allergies or rhinitis.  Marland Kitchen gabapentin (NEURONTIN) 100 MG capsule TAKE 1 CAPSULE (100 MG TOTAL) BY MOUTH AT BEDTIME. WHEN NECESSARY FOR NEUROPATHY PAIN (Patient taking differently: Take 100 mg by mouth at bedtime as needed (pain). When necessary for neuropathy pain)  . glipiZIDE (GLUCOTROL) 5 MG tablet Take 5 mg by mouth daily before breakfast.  . lidocaine-prilocaine (EMLA) cream Apply 1 application topically 3 (three) times a week. 1-2 hours prior to dialysis  . loperamide (IMODIUM) 2 MG capsule Take 2 mg by mouth every 4  (four) hours as needed (diarrhea).  . montelukast (SINGULAIR) 10 MG tablet TAKE 1 TABLET BY MOUTH EVERYDAY AT BEDTIME  . multivitamin (RENA-VIT) TABS tablet Take 1 tablet by mouth daily.   Marland Kitchen omeprazole (PRILOSEC) 20 MG capsule Take 20 mg by mouth daily.   . ondansetron (ZOFRAN) 4 MG tablet Take 1 tablet (4 mg total) by mouth every 6 (six) hours as needed for nausea.  Marland Kitchen oxyCODONE-acetaminophen (PERCOCET) 10-325 MG tablet Take 1 tablet by mouth every 6 (six) hours as needed for pain.  . polyethylene glycol (MIRALAX / GLYCOLAX) 17 g packet Take 17 g by mouth daily.  . traZODone (DESYREL) 50 MG tablet Take 50 mg by mouth at bedtime as needed for sleep.  . Vitamin D, Ergocalciferol, (DRISDOL) 1.25 MG (50000 UT) CAPS capsule Take 50,000 Units by mouth every Sunday. Takes on Sunday   No current facility-administered medications for this visit. (Other)      REVIEW OF SYSTEMS:    ALLERGIES Allergies  Allergen Reactions  . Adhesive  [Tape] Hives  . Lisinopril Cough  . Prednisone Swelling and Other (See Comments)    Excessive fluid buildup    PAST MEDICAL HISTORY Past Medical History:  Diagnosis Date  . Abdominal bruit   . Anemia   . Anxiety   . Arthritis    Osteoarthritis  . Asthma   . Cervical disc disease    "pinced nerve"  . CHF (congestive heart failure) ([place])   . Complication of anesthesia    " after I got home from my last procedure, I started itching."  . Diabetes mellitus    Type II  . Diverticulitis   . ESRD (end stage renal disease) ([place])    dialysis - M/W/F- [place]  . GERD (gastroesophageal reflux disease)    from medications  . GI bleed 03/31/2013  . Head injury 07/2017  . History of hiatal hernia   . Hyperlipidemia   . Hypertension   . Neuropathy    left leg  . Osteoporosis   . Peripheral vascular disease ([place])   . Pneumonia    "very young" and a few years ago  . PONV (postoperative nausea and vomiting)   . Seasonal allergies   . Shortness of breath  dyspnea    WIth exertion, when fluid builds  . Sleep apnea    can't afford cpap    Past Surgical History:  Procedure Laterality Date  . A/V SHUNTOGRAM N/A 09/22/2016   Procedure: A/V Shuntogram - left arm;  Surgeon: Serafina Mitchell, MD;  Location: [facility];  Service: Cardiovascular;  Laterality: N/A;  . A/V SHUNTOGRAM N/A 03/22/2018   Procedure: A/V SHUNTOGRAM - left arm;  Surgeon: Serafina Mitchell, MD;  Location: [facility];  Service: Cardiovascular;  Laterality: N/A;  . A/V SHUNTOGRAM Left 11/17/2018   Procedure: A/V  SHUNTOGRAM;  Surgeon: Angelia Mould, MD;  Location: [facility];  Service: Cardiovascular;  Laterality: Left;  . ABDOMINAL HYSTERECTOMY  1993`  . AMPUTATION Left 09/01/2016   Procedure: LEFT FOOT TRANSMETATARSAL AMPUTATION;  Surgeon: Newt Minion, MD;  Location: [facility];  Service: Orthopedics;  Laterality: Left;  . AMPUTATION Right 08/11/2017   Procedure: RIGHT GREAT TOE AMPUTATION DIGIT;  Surgeon: Rosetta Posner, MD;  Location: [facility];  Service: Vascular;  Laterality: Right;  . AMPUTATION Right 12/31/2017   Procedure: RIGHT TRANSMETATARSAL AMPUTATION;  Surgeon: Newt Minion, MD;  Location: [facility];  Service: Orthopedics;  Laterality: Right;  . AV FISTULA PLACEMENT Left 04/21/2016   Procedure: INSERTION OF ARTERIOVENOUS (AV) GORE-TEX GRAFT ARM LEFT;  Surgeon: Elam Dutch, MD;  Location: [facility];  Service: Vascular;  Laterality: Left;  . [place] TRANSPOSITION Left 07/10/2014   Procedure: BASCILIC VEIN TRANSPOSITION;  Surgeon: Angelia Mould, MD;  Location: [facility];  Service: Vascular;  Laterality: Left;  . BASCILIC VEIN TRANSPOSITION Right 11/08/2014   Procedure: FIRST STAGE BASILIC VEIN TRANSPOSITION;  Surgeon: Angelia Mould, MD;  Location: [facility];  Service: Vascular;  Laterality: Right;  . BASCILIC VEIN TRANSPOSITION Right 01/18/2015   Procedure: SECOND STAGE BASILIC VEIN TRANSPOSITION;  Surgeon: Angelia Mould, MD;  Location:  [place] [facility];  Service: Vascular;  Laterality: Right;  . CRANIOTOMY N/A 08/23/2017   Procedure: CRANIOTOMY HEMATOMA EVACUATION SUBDURAL;  Surgeon: Ashok Pall, MD;  Location: [facility];  Service: Neurosurgery;  Laterality: N/A;  . CRANIOTOMY Left 08/24/2017   Procedure: CRANIOTOMY FOR RECURRENT ACUTE SUBDURAL HEMATOMA;  Surgeon: Ashok Pall, MD;  Location: [facility];  Service: Neurosurgery;  Laterality: Left;  . ESOPHAGOGASTRODUODENOSCOPY N/A 03/31/2013   Procedure: ESOPHAGOGASTRODUODENOSCOPY (EGD);  Surgeon: Gatha Mayer, MD;  Location: [facility] ENDOSCOPY;  Service: Endoscopy;  Laterality: N/A;  . EYE SURGERY     laser surgery  . FEMORAL-POPLITEAL BYPASS GRAFT Left 07/08/2016   Procedure: LEFT  FEMORAL-BELOW KNEE POPLITEAL ARTERY BYPASS GRAFT USING 6MM X 80 CM PROPATEN GORETEX GRAFT WITH RINGS.;  Surgeon: Rosetta Posner, MD;  Location: [facility];  Service: Vascular;  Laterality: Left;  . FEMORAL-POPLITEAL BYPASS GRAFT Right 08/11/2017   Procedure: RIGHT FEMORAL TO BELOW KNEE POPLITEAL ARTERKY  BYPASS GRAFT USING 6MM RINGED PROPATEN GRAFT;  Surgeon: Rosetta Posner, MD;  Location: [facility];  Service: Vascular;  Laterality: Right;  . FISTULOGRAM Left 10/29/2014   Procedure: FISTULOGRAM;  Surgeon: Angelia Mould, MD;  Location: [facility] CATH LAB;  Service: Cardiovascular;  Laterality: Left;  Marland Kitchen GAS/FLUID EXCHANGE Left 12/20/2018   Procedure: AIR GAS EXCHANGE;  Surgeon: Jalene Mullet, MD;  Location: [facility];  Service: Ophthalmology;  Laterality: Left;  . INSERTION OF DIALYSIS CATHETER N/A 09/05/2020   Procedure: INSERTION OF DIALYSIS CATHETER;  Surgeon: Cherre Robins, MD;  Location: [place];  Service: Vascular;  Laterality: N/A;  . KNEE ARTHROSCOPY Right 05/06/2018   Procedure: RIGHT KNEE ARTHROSCOPY;  Surgeon: Newt Minion, MD;  Location: [facility];  Service: Orthopedics;  Laterality: Right;  . KNEE ARTHROSCOPY Right 06/10/2018   Procedure: RIGHT KNEE ARTHROSCOPY AND DEBRIDEMENT;  Surgeon: Newt Minion, MD;  Location: [place];   Service: Orthopedics;  Laterality: Right;  . LIGATION OF ARTERIOVENOUS  FISTULA Left 04/21/2016   Procedure: LIGATION OF ARTERIOVENOUS  FISTULA LEFT ARM;  Surgeon: Elam Dutch, MD;  Location: [place];  Service: Vascular;  Laterality: Left;  . LOWER EXTREMITY ANGIOGRAPHY N/A 04/06/2017   Procedure:  Lower Extremity Angiography - Right;  Surgeon: Serafina Mitchell, MD;  Location: [facility];  Service: Cardiovascular;  Laterality: N/A;  . MEMBRANE PEEL Left 12/20/2018   Procedure: MEMBRANE PEEL;  Surgeon: Jalene Mullet, MD;  Location: [place];  Service: Ophthalmology;  Laterality: Left;  . PATCH ANGIOPLASTY Right 01/18/2015   Procedure: BASILIC VEIN PATCH ANGIOPLASTY USING VASCUGUARD PATCH;  Surgeon: Angelia Mould, MD;  Location: [place];  Service: Vascular;  Laterality: Right;  . PERIPHERAL VASCULAR BALLOON ANGIOPLASTY  09/22/2016   Procedure: Peripheral Vascular Balloon Angioplasty;  Surgeon: Serafina Mitchell, MD;  Location: [place] [facility];  Service: Cardiovascular;;  Lt. Fistula  . PERIPHERAL VASCULAR BALLOON ANGIOPLASTY Left 03/22/2018   Procedure: PERIPHERAL VASCULAR BALLOON ANGIOPLASTY;  Surgeon: Serafina Mitchell, MD;  Location: [facility];  Service: Cardiovascular;  Laterality: Left;  Arm shunt  . PERIPHERAL VASCULAR CATHETERIZATION N/A 06/23/2016   Procedure: Abdominal Aortogram w/Lower Extremity;  Surgeon: Serafina Mitchell, MD;  Location: [place] [facility];  Service: Cardiovascular;  Laterality: N/A;  . PERIPHERAL VASCULAR CATHETERIZATION  06/23/2016   Procedure: Peripheral Vascular Intervention;  Surgeon: Serafina Mitchell, MD;  Location: [facility];  Service: Cardiovascular;;  lt common and external illiac artery  . PHOTOCOAGULATION WITH LASER Left 12/20/2018   Procedure: PHOTOCOAGULATION WITH LASER;  Surgeon: Jalene Mullet, MD;  Location: [place];  Service: Ophthalmology;  Laterality: Left;  . 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    Procedure: REPAIR OF COMPLEX TRACTION RETINAL DETACHMENT;  Surgeon: Jalene Mullet, MD;  Location: [place];  Service: Ophthalmology;  Laterality: Left;  . REVISION OF ARTERIOVENOUS GORETEX GRAFT Left 09/05/2020   Procedure: REVISION OF ARTERIOVENOUS GORETEX GRAFT LEFT;  Surgeon: Cherre Robins, MD;  Location: [facility] OR;  Service: Vascular;  Laterality: Left;    FAMILY HISTORY Family History  Problem Relation Age of Onset  . Other Mother        not sure of cause of death  . Diabetes Father   . Pancreatic cancer Maternal Grandmother   . Diabetes Sister   . Diabetes Sister   . Colon cancer Neg Hx     SOCIAL HISTORY Social History   Tobacco Use  . Smoking status: Former Smoker    Packs/day: 0.35    Years: 40.00    Pack years: 14.00    Types: Cigarettes    Quit date: 05/10/2012    Years since quitting: 8.4  . Smokeless tobacco: Never Used  Vaping Use  . Vaping Use: Never used  Substance Use Topics  . Alcohol use: No  . Drug use: No    Comment: marijuana; quit in early 1980's         OPHTHALMIC EXAM: Base Eye Exam    Visual Acuity (ETDRS)      Right Left   Dist [place] 20/60 +2 20/60 +2   Dist ph [place] 20/50 20/40       Tonometry (Tonopen, 2:02 PM)      Right Left   Pressure 20 23       Pupils      Pupils Dark Light Shape React APD   Right PERRL 3 3 Round Minimal None   Left PERRL 3 3 Round Minimal None       Visual Fields (Counting fingers)      Left Right    Full    Restrictions  Partial outer inferior nasal deficiency       Extraocular Movement  Right Left    Full Full       Neuro/Psych    Oriented x3: Yes   Mood/Affect: Normal       Dilation    Right eye: 1.0% Mydriacyl, 2.5% Phenylephrine @ 2:10 PM        Slit Lamp and Fundus Exam    External Exam      Right Left   External Normal Normal       Slit Lamp Exam      Right Left   Lids/Lashes Normal Normal   Conjunctiva/Sclera White and quiet White and quiet   Cornea Clear Clear   Anterior  Chamber Deep and quiet Deep and quiet   Iris Round and reactive Round and reactive   Lens Centered posterior chamber intraocular lens Centered posterior chamber intraocular lens   Anterior Vitreous Normal Normal, remnant       Fundus Exam      Right Left   Posterior Vitreous Pre-retinal hemorrhage    Disc Neovascularization    C/D Ratio 0.5    Macula Microaneurysms    Vessels PDR-active with NVE inferior to nerve NVE temporal portion of macula    Periphery  moderate scatter           IMAGING AND PROCEDURES  Imaging and Procedures for 10/29/20  OCT, Retina - OU - Both Eyes       Right Eye Quality was good. Scan locations included subfoveal. Central Foveal Thickness: 35. Progression has improved. Findings include vitreous traction, vitreomacular adhesion , abnormal foveal contour, cystoid macular edema.   Left Eye Quality was good. Scan locations included subfoveal. Central Foveal Thickness: 319. Progression has been stable. Findings include abnormal foveal contour, cystoid macular edema.   Notes Quiescent proliferative diabetic retinopathy exist OU we will continue to monitor.  Vitreomacular traction with less CSME OD, will need antivegF OD and then monitor the response To look for crunch syndrome post duction Avastin  OS with CSME particularly temporal to the fovea.  Improved and yet stable       Color Fundus Photography Optos - OU - Both Eyes       Right Eye Progression has been stable. Macula : exudates, edema.   Left Eye Progression has been stable. Disc findings include normal observations. Macula : exudates.   Notes Proliferative diabetic retinopathy OD with membranes over the macula and optic nerve, quiescent yet with traction.  Preretinal hemorrhage inferior and temporal         Intravitreal Injection, Pharmacologic Agent - OD - Right Eye       Time Out 10/29/2020. 3:07 PM. Confirmed correct patient, procedure, site, and patient consented.    Anesthesia Topical anesthesia was used. Anesthetic medications included [place] 3.5%.   Procedure Preparation included Tobramycin 0.3%, Ofloxacin , 10% betadine to eyelids, 5% betadine to ocular surface. A 30 gauge needle was used.   Injection:  2.5 mg Bevacizumab (AVASTIN) 2.5mg /0.32mL SOSY   NDC: 76283-151-76   Route: Intravitreal, Site: Right Eye                 ASSESSMENT/PLAN:  Diabetic macular edema of right eye with proliferative retinopathy associated with type 2 diabetes mellitus ([place]) With secondary CSME  Macular pucker, right eye Monitor the response to antivegF today to look for signs of crunch syndrome      ICD-10-CM   1. Diabetic macular edema of right eye with proliferative retinopathy associated with type 2 diabetes mellitus ([place])  E11.3511 OCT, Retina - OU -  Both Eyes    Color Fundus Photography Optos - OU - Both Eyes    Intravitreal Injection, Pharmacologic Agent - OD - Right Eye    bevacizumab (AVASTIN) SOSY 2.5 mg  2. Macular pucker, right eye  H35.371     1.  We will deliver antivegF today to quiet PDR OD and will monitor for development of crunch syndrome increasing or change of traction on the macula  2.  Up 1 week dilate right eye OCT  3.  Ophthalmic Meds Ordered this visit:  Meds ordered this encounter  Medications  . bevacizumab (AVASTIN) SOSY 2.5 mg       Return in about 1 week (around 11/05/2020) for dilate, OD, OCT.  There are no Patient Instructions on file for this visit.   Explained the diagnoses, plan, and follow up with the patient and they expressed understanding.  Patient expressed understanding of the importance of proper follow up care.   Clent Demark Shweta Aman M.D. Diseases & Surgery of the Retina and Vitreous [facility] [place] 10/29/20     Abbreviations: M myopia (nearsighted); A astigmatism; H hyperopia (farsighted); P presbyopia; Mrx spectacle prescription;  CTL contact lenses; OD right eye; OS left eye; OU  both eyes  XT exotropia; ET esotropia; PEK punctate epithelial keratitis; PEE punctate epithelial erosions; DES dry eye syndrome; MGD meibomian gland dysfunction; ATs artificial tears; PFAT's preservative free artificial tears; [place] nuclear sclerotic cataract; PSC posterior subcapsular cataract; ERM epi-retinal membrane; PVD posterior vitreous detachment; RD retinal detachment; DM diabetes mellitus; DR diabetic retinopathy; NPDR non-proliferative diabetic retinopathy; PDR proliferative diabetic retinopathy; CSME clinically significant macular edema; DME diabetic macular edema; dbh dot blot hemorrhages; CWS cotton wool spot; POAG primary open angle glaucoma; C/D cup-to-disc ratio; HVF humphrey visual field; GVF goldmann visual field; OCT optical coherence tomography; IOP intraocular pressure; BRVO Branch retinal vein occlusion; CRVO central retinal vein occlusion; CRAO central retinal artery occlusion; BRAO branch retinal artery occlusion; RT retinal tear; SB scleral buckle; PPV pars plana vitrectomy; VH Vitreous hemorrhage; PRP panretinal laser photocoagulation; IVK intravitreal kenalog; VMT vitreomacular traction; MH Macular hole;  NVD neovascularization of the disc; NVE neovascularization elsewhere; AREDS age related eye disease study; ARMD age related macular degeneration; POAG primary open angle glaucoma; EBMD epithelial/anterior basement membrane dystrophy; ACIOL anterior chamber intraocular lens; IOL intraocular lens; PCIOL posterior chamber intraocular lens; Phaco/IOL phacoemulsification with intraocular lens placement; [place] photorefractive keratectomy; LASIK laser assisted in situ keratomileusis; HTN hypertension; DM diabetes mellitus; COPD chronic obstructive pulmonary disease

## 2020-10-29 NOTE — Patient Outreach (Signed)
[facility] [facility]) Care Management  10/29/2020  Destiny Day 1950/08/11 [id number]  Unsuccessful outreach attempt made to patient. RN Health Coach left HIPAA compliant voicemail message along with her contact information.  Plan: RN Health Coach will call patient within the month of May.  Emelia Loron RN, BSN [place] [phone] [email or web address]

## 2020-10-29 NOTE — Assessment & Plan Note (Signed)
Monitor the response to antivegF today to look for signs of crunch syndrome

## 2020-10-29 NOTE — Assessment & Plan Note (Signed)
With secondary CSME

## 2020-10-30 DIAGNOSIS — Z992 Dependence on renal dialysis: Secondary | ICD-10-CM | POA: Diagnosis not present

## 2020-10-30 DIAGNOSIS — D631 Anemia in chronic kidney disease: Secondary | ICD-10-CM | POA: Diagnosis not present

## 2020-10-30 DIAGNOSIS — N2581 Secondary hyperparathyroidism of renal origin: Secondary | ICD-10-CM | POA: Diagnosis not present

## 2020-10-30 DIAGNOSIS — E876 Hypokalemia: Secondary | ICD-10-CM | POA: Diagnosis not present

## 2020-10-30 DIAGNOSIS — D509 Iron deficiency anemia, unspecified: Secondary | ICD-10-CM | POA: Diagnosis not present

## 2020-10-30 DIAGNOSIS — N186 End stage renal disease: Secondary | ICD-10-CM | POA: Diagnosis not present

## 2020-10-30 DIAGNOSIS — E1122 Type 2 diabetes mellitus with diabetic chronic kidney disease: Secondary | ICD-10-CM | POA: Diagnosis not present

## 2020-11-01 DIAGNOSIS — Z992 Dependence on renal dialysis: Secondary | ICD-10-CM | POA: Diagnosis not present

## 2020-11-01 DIAGNOSIS — D631 Anemia in chronic kidney disease: Secondary | ICD-10-CM | POA: Diagnosis not present

## 2020-11-01 DIAGNOSIS — D509 Iron deficiency anemia, unspecified: Secondary | ICD-10-CM | POA: Diagnosis not present

## 2020-11-01 DIAGNOSIS — E876 Hypokalemia: Secondary | ICD-10-CM | POA: Diagnosis not present

## 2020-11-01 DIAGNOSIS — N2581 Secondary hyperparathyroidism of renal origin: Secondary | ICD-10-CM | POA: Diagnosis not present

## 2020-11-01 DIAGNOSIS — N186 End stage renal disease: Secondary | ICD-10-CM | POA: Diagnosis not present

## 2020-11-04 DIAGNOSIS — D631 Anemia in chronic kidney disease: Secondary | ICD-10-CM | POA: Diagnosis not present

## 2020-11-04 DIAGNOSIS — Z992 Dependence on renal dialysis: Secondary | ICD-10-CM | POA: Diagnosis not present

## 2020-11-04 DIAGNOSIS — N2581 Secondary hyperparathyroidism of renal origin: Secondary | ICD-10-CM | POA: Diagnosis not present

## 2020-11-04 DIAGNOSIS — E876 Hypokalemia: Secondary | ICD-10-CM | POA: Diagnosis not present

## 2020-11-04 DIAGNOSIS — D509 Iron deficiency anemia, unspecified: Secondary | ICD-10-CM | POA: Diagnosis not present

## 2020-11-04 DIAGNOSIS — N186 End stage renal disease: Secondary | ICD-10-CM | POA: Diagnosis not present

## 2020-11-05 ENCOUNTER — Encounter (INDEPENDENT_AMBULATORY_CARE_PROVIDER_SITE_OTHER): Payer: Self-pay | Admitting: Ophthalmology

## 2020-11-05 ENCOUNTER — Other Ambulatory Visit: Payer: Self-pay

## 2020-11-05 ENCOUNTER — Ambulatory Visit (INDEPENDENT_AMBULATORY_CARE_PROVIDER_SITE_OTHER): Payer: Medicare Other | Admitting: Ophthalmology

## 2020-11-05 ENCOUNTER — Ambulatory Visit (INDEPENDENT_AMBULATORY_CARE_PROVIDER_SITE_OTHER): Payer: Medicare Other | Admitting: *Deleted

## 2020-11-05 ENCOUNTER — Other Ambulatory Visit: Payer: Self-pay | Admitting: Cardiology

## 2020-11-05 DIAGNOSIS — Z008 Encounter for other general examination: Secondary | ICD-10-CM | POA: Diagnosis not present

## 2020-11-05 DIAGNOSIS — N9489 Other specified conditions associated with female genital organs and menstrual cycle: Secondary | ICD-10-CM | POA: Diagnosis not present

## 2020-11-05 DIAGNOSIS — J309 Allergic rhinitis, unspecified: Secondary | ICD-10-CM

## 2020-11-05 DIAGNOSIS — E113512 Type 2 diabetes mellitus with proliferative diabetic retinopathy with macular edema, left eye: Secondary | ICD-10-CM

## 2020-11-05 DIAGNOSIS — E113511 Type 2 diabetes mellitus with proliferative diabetic retinopathy with macular edema, right eye: Secondary | ICD-10-CM | POA: Diagnosis not present

## 2020-11-05 DIAGNOSIS — Z0001 Encounter for general adult medical examination with abnormal findings: Secondary | ICD-10-CM | POA: Diagnosis not present

## 2020-11-05 DIAGNOSIS — N186 End stage renal disease: Secondary | ICD-10-CM | POA: Diagnosis not present

## 2020-11-05 DIAGNOSIS — I5033 Acute on chronic diastolic (congestive) heart failure: Secondary | ICD-10-CM

## 2020-11-05 DIAGNOSIS — Z794 Long term (current) use of insulin: Secondary | ICD-10-CM | POA: Diagnosis not present

## 2020-11-05 DIAGNOSIS — Z0000 Encounter for general adult medical examination without abnormal findings: Secondary | ICD-10-CM | POA: Diagnosis not present

## 2020-11-05 DIAGNOSIS — H43821 Vitreomacular adhesion, right eye: Secondary | ICD-10-CM

## 2020-11-05 DIAGNOSIS — K4090 Unilateral inguinal hernia, without obstruction or gangrene, not specified as recurrent: Secondary | ICD-10-CM | POA: Diagnosis not present

## 2020-11-05 DIAGNOSIS — K8020 Calculus of gallbladder without cholecystitis without obstruction: Secondary | ICD-10-CM | POA: Diagnosis not present

## 2020-11-05 DIAGNOSIS — R110 Nausea: Secondary | ICD-10-CM | POA: Diagnosis not present

## 2020-11-05 DIAGNOSIS — E1151 Type 2 diabetes mellitus with diabetic peripheral angiopathy without gangrene: Secondary | ICD-10-CM | POA: Diagnosis not present

## 2020-11-05 DIAGNOSIS — N200 Calculus of kidney: Secondary | ICD-10-CM | POA: Diagnosis not present

## 2020-11-05 NOTE — Assessment & Plan Note (Signed)
CSME OS massive temporal to the fovea needs repeat injection Avastin in the near future, likely early May

## 2020-11-05 NOTE — Progress Notes (Signed)
11/05/2020     CHIEF COMPLAINT Patient presents for Retina Follow Up (1 wk fu OD/ Possible Pre-op OD/Pt states, " My VA seems to be a little better and the floaters are gone."/LBS: 58 (Wednesday))   HISTORY OF PRESENT ILLNESS: Destiny Day is a 70 y.o. female who presents to the clinic today for:   HPI    Retina Follow Up    Patient presents with  Diabetic Retinopathy.  In right eye.  This started 1 week ago.  Duration of 1 week. Additional comments: 1 wk fu OD/ Possible Pre-op OD Pt states, " My VA seems to be a little better and the floaters are gone." LBS: 73 (Wednesday)          Comments    1 week post injection Avastin OD for for CSME, and follow-up today to look for signs of crunch syndrome or further tractional change vitreomacular traction on the fovea.       Last edited by Hurman Horn, MD on 11/05/2020  4:03 PM. (History)      Referring physician: Cipriano Mile, NP [place],  [place] [address]  HISTORICAL INFORMATION:   Selected notes from the MEDICAL RECORD NUMBER    Lab Results  Component Value Date   HGBA1C 5.6 06/26/2019     CURRENT MEDICATIONS: No current outpatient medications on file. (Ophthalmic Drugs)   No current facility-administered medications for this visit. (Ophthalmic Drugs)   Current Outpatient Medications (Other)  Medication Sig  . albuterol (PROAIR HFA) 108 (90 Base) MCG/ACT inhaler 4 puffs every 4-6 hours as needed (Patient taking differently: Inhale 4 puffs into the lungs every 4 (four) hours as needed for wheezing or shortness of breath. 4 puffs every 4-6 hours as needed)  . amLODipine (NORVASC) 10 MG tablet Take 10 mg by mouth at bedtime.  Marland Kitchen aspirin EC 81 MG tablet Take 81 mg by mouth daily.  Marland Kitchen atorvastatin (LIPITOR) 40 MG tablet Take 1 tablet (40 mg total) by mouth daily.  Marland Kitchen azelastine (ASTELIN) 0.1 % nasal spray Place 1 spray into both nostrils 2 (two) times daily.  Marland Kitchen BIDIL 20-37.5 MG tablet TAKE 1 TABLET BY  MOUTH THREE TIMES A DAY  . budesonide-formoterol (SYMBICORT) 160-4.5 MCG/ACT inhaler 2 puffs twice daily with spacer (Patient taking differently: Inhale into the lungs in the morning and at bedtime. 2 puffs twice daily with spacer)  . busPIRone (BUSPAR) 7.5 MG tablet Take 7.5 mg by mouth 2 (two) times daily.  . carvedilol (COREG) 12.5 MG tablet Take 12.5 mg by mouth See admin instructions. Take 1 tablet (12.5 mg) by mouth once daily in the evening on Mondays, Wednesdays, & Fridays. (Dialysis) Take 1 tablet (12.5 mg) by mouth twice daily on Sundays, Tuesdays, Thursdays, & Saturdays. (Non Dialysis)  . cinacalcet (SENSIPAR) 60 MG tablet Take 90 mg by mouth See admin instructions. Take 1 tablet (90 mg) by mouth in the evening on Mondays, Wednesdays, & Fridays (dialysis) Take 1 tablet (90 mg) by mouth twice daily on Sundays, Tuesdays, Thursdays, Saturdays. (non dialysis)  . docusate sodium (COLACE) 100 MG capsule Take 1 capsule (100 mg total) by mouth 2 (two) times daily.  Marland Kitchen EPINEPHrine 0.3 mg/0.3 mL IJ SOAJ injection Inject 0.3 mLs (0.3 mg total) into the muscle as needed for anaphylaxis.  . ferric citrate (AURYXIA) 1 GM 210 MG(Fe) tablet Take 210-420 mg by mouth See admin instructions. Take 2 tablets (420 mg) by mouth with each meal & take 1 tablet (210  mg) by mouth with snacks.  . fluticasone (FLONASE) 50 MCG/ACT nasal spray Place 2 sprays into both nostrils daily as needed for allergies or rhinitis.  Marland Kitchen gabapentin (NEURONTIN) 100 MG capsule TAKE 1 CAPSULE (100 MG TOTAL) BY MOUTH AT BEDTIME. WHEN NECESSARY FOR NEUROPATHY PAIN (Patient taking differently: Take 100 mg by mouth at bedtime as needed (pain). When necessary for neuropathy pain)  . glipiZIDE (GLUCOTROL) 5 MG tablet Take 5 mg by mouth daily before breakfast.  . lidocaine-prilocaine (EMLA) cream Apply 1 application topically 3 (three) times a week. 1-2 hours prior to dialysis  . loperamide (IMODIUM) 2 MG capsule Take 2 mg by mouth every 4 (four)  hours as needed (diarrhea).  . montelukast (SINGULAIR) 10 MG tablet TAKE 1 TABLET BY MOUTH EVERYDAY AT BEDTIME  . multivitamin (RENA-VIT) TABS tablet Take 1 tablet by mouth daily.   Marland Kitchen omeprazole (PRILOSEC) 20 MG capsule Take 20 mg by mouth daily.   . ondansetron (ZOFRAN) 4 MG tablet Take 1 tablet (4 mg total) by mouth every 6 (six) hours as needed for nausea.  Marland Kitchen oxyCODONE-acetaminophen (PERCOCET) 10-325 MG tablet Take 1 tablet by mouth every 6 (six) hours as needed for pain.  . polyethylene glycol (MIRALAX / GLYCOLAX) 17 g packet Take 17 g by mouth daily.  . traZODone (DESYREL) 50 MG tablet Take 50 mg by mouth at bedtime as needed for sleep.  . Vitamin D, Ergocalciferol, (DRISDOL) 1.25 MG (50000 UT) CAPS capsule Take 50,000 Units by mouth every Sunday. Takes on Sunday   No current facility-administered medications for this visit. (Other)      REVIEW OF SYSTEMS:    ALLERGIES Allergies  Allergen Reactions  . Adhesive  [Tape] Hives  . Lisinopril Cough  . Prednisone Swelling and Other (See Comments)    Excessive fluid buildup    PAST MEDICAL HISTORY Past Medical History:  Diagnosis Date  . Abdominal bruit   . Anemia   . Anxiety   . Arthritis    Osteoarthritis  . Asthma   . Cervical disc disease    "pinced nerve"  . CHF (congestive heart failure) ([place])   . Complication of anesthesia    " after I got home from my last procedure, I started itching."  . Diabetes mellitus    Type II  . Diverticulitis   . ESRD (end stage renal disease) ([place])    dialysis - M/W/F- [place]  . GERD (gastroesophageal reflux disease)    from medications  . GI bleed 03/31/2013  . Head injury 07/2017  . History of hiatal hernia   . Hyperlipidemia   . Hypertension   . Neuropathy    left leg  . Osteoporosis   . Peripheral vascular disease ([place])   . Pneumonia    "very young" and a few years ago  . PONV (postoperative nausea and vomiting)   . Seasonal allergies   . Shortness of breath dyspnea     WIth exertion, when fluid builds  . Sleep apnea    can't afford cpap    Past Surgical History:  Procedure Laterality Date  . A/V SHUNTOGRAM N/A 09/22/2016   Procedure: A/V Shuntogram - left arm;  Surgeon: Serafina Mitchell, MD;  Location: [facility];  Service: Cardiovascular;  Laterality: N/A;  . A/V SHUNTOGRAM N/A 03/22/2018   Procedure: A/V SHUNTOGRAM - left arm;  Surgeon: Serafina Mitchell, MD;  Location: [facility];  Service: Cardiovascular;  Laterality: N/A;  . A/V SHUNTOGRAM Left 11/17/2018  Procedure: A/V SHUNTOGRAM;  Surgeon: Angelia Mould, MD;  Location: [facility];  Service: Cardiovascular;  Laterality: Left;  . ABDOMINAL HYSTERECTOMY  1993`  . AMPUTATION Left 09/01/2016   Procedure: LEFT FOOT TRANSMETATARSAL AMPUTATION;  Surgeon: Newt Minion, MD;  Location: [facility];  Service: Orthopedics;  Laterality: Left;  . AMPUTATION Right 08/11/2017   Procedure: RIGHT GREAT TOE AMPUTATION DIGIT;  Surgeon: Rosetta Posner, MD;  Location: [facility];  Service: Vascular;  Laterality: Right;  . AMPUTATION Right 12/31/2017   Procedure: RIGHT TRANSMETATARSAL AMPUTATION;  Surgeon: Newt Minion, MD;  Location: [facility];  Service: Orthopedics;  Laterality: Right;  . AV FISTULA PLACEMENT Left 04/21/2016   Procedure: INSERTION OF ARTERIOVENOUS (AV) GORE-TEX GRAFT ARM LEFT;  Surgeon: Elam Dutch, MD;  Location: [facility];  Service: Vascular;  Laterality: Left;  . [place] TRANSPOSITION Left 07/10/2014   Procedure: BASCILIC VEIN TRANSPOSITION;  Surgeon: Angelia Mould, MD;  Location: [facility];  Service: Vascular;  Laterality: Left;  . BASCILIC VEIN TRANSPOSITION Right 11/08/2014   Procedure: FIRST STAGE BASILIC VEIN TRANSPOSITION;  Surgeon: Angelia Mould, MD;  Location: [facility];  Service: Vascular;  Laterality: Right;  . BASCILIC VEIN TRANSPOSITION Right 01/18/2015   Procedure: SECOND STAGE BASILIC VEIN TRANSPOSITION;  Surgeon: Angelia Mould, MD;  Location: [facility];   Service: Vascular;  Laterality: Right;  . CRANIOTOMY N/A 08/23/2017   Procedure: CRANIOTOMY HEMATOMA EVACUATION SUBDURAL;  Surgeon: Ashok Pall, MD;  Location: [facility];  Service: Neurosurgery;  Laterality: N/A;  . CRANIOTOMY Left 08/24/2017   Procedure: CRANIOTOMY FOR RECURRENT ACUTE SUBDURAL HEMATOMA;  Surgeon: Ashok Pall, MD;  Location: [facility];  Service: Neurosurgery;  Laterality: Left;  . ESOPHAGOGASTRODUODENOSCOPY N/A 03/31/2013   Procedure: ESOPHAGOGASTRODUODENOSCOPY (EGD);  Surgeon: Gatha Mayer, MD;  Location: [facility]OS[facility];  Service: Endoscopy;  Laterality: N/A;  . EYE SURGERY     laser surgery  . FEMORAL-POPLITEAL BYPASS GRAFT Left 07/08/2016   Procedure: LEFT  FEMORAL-BELOW KNEE POPLITEAL ARTERY BYPASS GRAFT USING 6MM X 80 CM PROPATEN GORETEX GRAFT WITH RINGS.;  Surgeon: Rosetta Posner, MD;  Location: [facility];  Service: Vascular;  Laterality: Left;  . FEMORAL-POPLITEAL BYPASS GRAFT Right 08/11/2017   Procedure: RIGHT FEMORAL TO BELOW KNEE POPLITEAL ARTERKY  BYPASS GRAFT USING 6MM RINGED PROPATEN GRAFT;  Surgeon: Rosetta Posner, MD;  Location: [facility];  Service: Vascular;  Laterality: Right;  . FISTULOGRAM Left 10/29/2014   Procedure: FISTULOGRAM;  Surgeon: Angelia Mould, MD;  Location: [facility];  Service: Cardiovascular;  Laterality: Left;  Marland Kitchen GAS/FLUID EXCHANGE Left 12/20/2018   Procedure: AIR GAS EXCHANGE;  Surgeon: Jalene Mullet, MD;  Location: [facility];  Service: Ophthalmology;  Laterality: Left;  . INSERTION OF DIALYSIS CATHETER N/A 09/05/2020   Procedure: INSERTION OF DIALYSIS CATHETER;  Surgeon: Cherre Robins, MD;  Location: [facility] [place];  Service: Vascular;  Laterality: N/A;  . KNEE ARTHROSCOPY Right 05/06/2018   Procedure: RIGHT KNEE ARTHROSCOPY;  Surgeon: Newt Minion, MD;  Location: [facility];  Service: Orthopedics;  Laterality: Right;  . KNEE ARTHROSCOPY Right 06/10/2018   Procedure: RIGHT KNEE ARTHROSCOPY AND DEBRIDEMENT;  Surgeon: Newt Minion, MD;  Location: [facility];  Service:  Orthopedics;  Laterality: Right;  . LIGATION OF ARTERIOVENOUS  FISTULA Left 04/21/2016   Procedure: LIGATION OF ARTERIOVENOUS  FISTULA LEFT ARM;  Surgeon: Elam Dutch, MD;  Location: [facility];  Service: Vascular;  Laterality: Left;  . LOWER EXTREMITY ANGIOGRAPHY N/A 04/06/2017  Procedure: Lower Extremity Angiography - Right;  Surgeon: Serafina Mitchell, MD;  Location: [facility];  Service: Cardiovascular;  Laterality: N/A;  . MEMBRANE PEEL Left 12/20/2018   Procedure: MEMBRANE PEEL;  Surgeon: Jalene Mullet, MD;  Location: [facility];  Service: Ophthalmology;  Laterality: Left;  . PATCH ANGIOPLASTY Right 01/18/2015   Procedure: BASILIC VEIN PATCH ANGIOPLASTY USING VASCUGUARD PATCH;  Surgeon: Angelia Mould, MD;  Location: [facility];  Service: Vascular;  Laterality: Right;  . PERIPHERAL VASCULAR BALLOON ANGIOPLASTY  09/22/2016   Procedure: Peripheral Vascular Balloon Angioplasty;  Surgeon: Serafina Mitchell, MD;  Location: [facility];  Service: Cardiovascular;;  Lt. Fistula  . PERIPHERAL VASCULAR BALLOON ANGIOPLASTY Left 03/22/2018   Procedure: PERIPHERAL VASCULAR BALLOON ANGIOPLASTY;  Surgeon: Serafina Mitchell, MD;  Location: [facility];  Service: Cardiovascular;  Laterality: Left;  Arm shunt  . PERIPHERAL VASCULAR CATHETERIZATION N/A 06/23/2016   Procedure: Abdominal Aortogram w/Lower Extremity;  Surgeon: Serafina Mitchell, MD;  Location: [facility];  Service: Cardiovascular;  Laterality: N/A;  . PERIPHERAL VASCULAR CATHETERIZATION  06/23/2016   Procedure: Peripheral Vascular Intervention;  Surgeon: Serafina Mitchell, MD;  Location: [facility];  Service: Cardiovascular;;  lt common and external illiac artery  . PHOTOCOAGULATION WITH LASER Left 12/20/2018   Procedure: PHOTOCOAGULATION WITH LASER;  Surgeon: Jalene Mullet, MD;  Location: [facility];  Service: Ophthalmology;  Laterality: Left;  . 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   Procedure:  REPAIR OF COMPLEX TRACTION RETINAL DETACHMENT;  Surgeon: Jalene Mullet, MD;  Location: [facility];  Service: Ophthalmology;  Laterality: Left;  . REVISION OF ARTERIOVENOUS GORETEX GRAFT Left 09/05/2020   Procedure: REVISION OF ARTERIOVENOUS GORETEX GRAFT LEFT;  Surgeon: Cherre Robins, MD;  [facility];  Service: Vascular;  Laterality: Left;    FAMILY HISTORY Family History  Problem Relation Age of Onset  . Other Mother        not sure of cause of death  . Diabetes Father   . Pancreatic cancer Maternal Grandmother   . Diabetes Sister   . Diabetes Sister   . Colon cancer Neg Hx     SOCIAL HISTORY Social History   Tobacco Use  . Smoking status: Former Smoker    Packs/day: 0.35    Years: 40.00    Pack years: 14.00    Types: Cigarettes    Quit date: 05/10/2012    Years since quitting: 8.4  . Smokeless tobacco: Never Used  Vaping Use  . Vaping Use: Never used  Substance Use Topics  . Alcohol use: No  . Drug use: No    Comment: marijuana; quit in early 1980's         OPHTHALMIC EXAM:  Base Eye Exam    Visual Acuity (ETDRS)      Right Left   Dist cc 20/50 -2 20/60 +2   Dist ph cc NI 20/50 -2   Correction: Glasses       Tonometry (Tonopen, 3:53 PM)      Right Left   Pressure 16 19       Pupils      Pupils Dark Light Shape React APD   Right PERRL 3 3 Round Minimal None   Left PERRL 3 3 Round Minimal None       Visual Fields      Left Right   Restrictions  Partial outer inferior nasal deficiency       Extraocular Movement  Right Left    Full Full       Neuro/Psych    Oriented x3: Yes   Mood/Affect: Normal       Dilation    Right eye: 1.0% Mydriacyl, 2.5% Phenylephrine @ 3:53 PM        Slit Lamp and Fundus Exam    External Exam      Right Left   External Normal Normal       Slit Lamp Exam      Right Left   Lids/Lashes Normal Normal   Conjunctiva/Sclera White and quiet White and quiet   Cornea Clear Clear   Anterior Chamber Deep  and quiet Deep and quiet   Iris Round and reactive Round and reactive   Lens Centered posterior chamber intraocular lens Centered posterior chamber intraocular lens   Anterior Vitreous Normal Normal, remnant       Fundus Exam      Right Left   Posterior Vitreous Pre-retinal hemorrhage    Disc Neovascularization    C/D Ratio 0.5    Macula Microaneurysms    Vessels PDR-active with NVE inferior to nerve NVE temporal portion of macula    Periphery  moderate scatter           IMAGING AND PROCEDURES  Imaging and Procedures for 11/05/20  OCT, Retina - OU - Both Eyes       Right Eye Quality was good. Scan locations included subfoveal. Central Foveal Thickness: 355. Progression has improved. Findings include abnormal foveal contour, vitreous traction, vitreomacular adhesion .   Left Eye Quality was good. Scan locations included subfoveal, extrafoveal. Central Foveal Thickness: 418. Progression has been stable. Findings include abnormal foveal contour.   Notes No signs of crunch syndrome OD after intravitreal injection Avastin, vitreomacular traction and adhesion remains yet less CSME                ASSESSMENT/PLAN:  Vitreomacular traction syndrome, right 1 week post injection Avastin, vitreomacular traction syndrome did not worsen.  In fact CSME did improve post injection  Diabetic macular edema of right eye with proliferative retinopathy associated with type 2 diabetes mellitus ([place]) Slightly improved post injection, with no worsening or vitreomacular traction syndrome we will continue to monitor  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 type 2 diabetes mellitus (H[facility]) CSME OS massive temporal to the fovea needs repeat injection Avastin in the near future, likely early May      ICD-10-CM   1. Diabetic macular edema of right eye with proliferative retinopathy associated with type 2 diabetes mellitus (HCC)  E11.3511 OCT, Retina -  OU - Both Eyes  2. Vitreomacular traction syndrome, right  H43.821   3. 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 type 2 diabetes mellitus ([place])  B76.2831     1.  2.  3.  Ophthalmic Meds Ordered this visit:  No orders of the defined types were placed in this encounter.      Return in about 3 weeks (around 11/26/2020) for dilate, OS, AVASTIN OCT.  There are no Patient Instructions on file for this visit.   Explained the diagnoses, plan, and follow up with the patient and they expressed understanding.  Patient expressed understanding of the importance of proper follow up care.   Clent Demark Chantel Teti M.D. Diseases & Surgery of the Retina and Vitreous Retina & Diabetic [place] 11/05/20     Abbreviations: M myopia (nearsighted); A astigmatism; H hyperopia (farsighted); P  presbyopia; Mrx spectacle prescription;  CTL contact lenses; OD right eye; OS left eye; OU both eyes  XT exotropia; ET esotropia; PEK punctate epithelial keratitis; PEE punctate epithelial erosions; DES dry eye syndrome; MGD meibomian gland dysfunction; ATs artificial tears; PFAT's preservative free artificial tears; [place] nuclear sclerotic cataract; PSC posterior subcapsular cataract; ERM epi-retinal membrane; PVD posterior vitreous detachment; RD retinal detachment; DM diabetes mellitus; DR diabetic retinopathy; NPDR non-proliferative diabetic retinopathy; PDR proliferative diabetic retinopathy; CSME clinically significant macular edema; DME diabetic macular edema; dbh dot blot hemorrhages; CWS cotton wool spot; POAG primary open angle glaucoma; C/D cup-to-disc ratio; HVF humphrey visual field; GVF goldmann visual field; OCT optical coherence tomography; IOP intraocular pressure; BRVO Branch retinal vein occlusion; CRVO central retinal vein occlusion; CRAO central retinal artery occlusion; BRAO branch retinal artery occlusion; RT retinal tear; SB scleral buckle; PPV pars plana  vitrectomy; VH Vitreous hemorrhage; PRP panretinal laser photocoagulation; IVK intravitreal kenalog; VMT vitreomacular traction; MH Macular hole;  NVD neovascularization of the disc; NVE neovascularization elsewhere; AREDS age related eye disease study; ARMD age related macular degeneration; POAG primary open angle glaucoma; EBMD epithelial/anterior basement membrane dystrophy; ACIOL anterior chamber intraocular lens; IOL intraocular lens; PCIOL posterior chamber intraocular lens; Phaco/IOL phacoemulsification with intraocular lens placement; [employer] photorefractive keratectomy; LASIK laser assisted in situ keratomileusis; HTN hypertension; DM diabetes mellitus; COPD chronic obstructive pulmonary disease

## 2020-11-05 NOTE — Assessment & Plan Note (Signed)
1 week post injection Avastin, vitreomacular traction syndrome did not worsen.  In fact CSME did improve post injection

## 2020-11-05 NOTE — Assessment & Plan Note (Signed)
Slightly improved post injection, with no worsening or vitreomacular traction syndrome we will continue to monitor

## 2020-11-06 DIAGNOSIS — D631 Anemia in chronic kidney disease: Secondary | ICD-10-CM | POA: Diagnosis not present

## 2020-11-06 DIAGNOSIS — N2581 Secondary hyperparathyroidism of renal origin: Secondary | ICD-10-CM | POA: Diagnosis not present

## 2020-11-06 DIAGNOSIS — N186 End stage renal disease: Secondary | ICD-10-CM | POA: Diagnosis not present

## 2020-11-06 DIAGNOSIS — Z992 Dependence on renal dialysis: Secondary | ICD-10-CM | POA: Diagnosis not present

## 2020-11-06 DIAGNOSIS — D509 Iron deficiency anemia, unspecified: Secondary | ICD-10-CM | POA: Diagnosis not present

## 2020-11-06 DIAGNOSIS — E876 Hypokalemia: Secondary | ICD-10-CM | POA: Diagnosis not present

## 2020-11-08 DIAGNOSIS — Z992 Dependence on renal dialysis: Secondary | ICD-10-CM | POA: Diagnosis not present

## 2020-11-08 DIAGNOSIS — E876 Hypokalemia: Secondary | ICD-10-CM | POA: Diagnosis not present

## 2020-11-08 DIAGNOSIS — N186 End stage renal disease: Secondary | ICD-10-CM | POA: Diagnosis not present

## 2020-11-08 DIAGNOSIS — D509 Iron deficiency anemia, unspecified: Secondary | ICD-10-CM | POA: Diagnosis not present

## 2020-11-08 DIAGNOSIS — N2581 Secondary hyperparathyroidism of renal origin: Secondary | ICD-10-CM | POA: Diagnosis not present

## 2020-11-08 DIAGNOSIS — D631 Anemia in chronic kidney disease: Secondary | ICD-10-CM | POA: Diagnosis not present

## 2020-11-11 DIAGNOSIS — N186 End stage renal disease: Secondary | ICD-10-CM | POA: Diagnosis not present

## 2020-11-11 DIAGNOSIS — D509 Iron deficiency anemia, unspecified: Secondary | ICD-10-CM | POA: Diagnosis not present

## 2020-11-11 DIAGNOSIS — D631 Anemia in chronic kidney disease: Secondary | ICD-10-CM | POA: Diagnosis not present

## 2020-11-11 DIAGNOSIS — Z992 Dependence on renal dialysis: Secondary | ICD-10-CM | POA: Diagnosis not present

## 2020-11-11 DIAGNOSIS — N2581 Secondary hyperparathyroidism of renal origin: Secondary | ICD-10-CM | POA: Diagnosis not present

## 2020-11-11 DIAGNOSIS — E876 Hypokalemia: Secondary | ICD-10-CM | POA: Diagnosis not present

## 2020-11-12 ENCOUNTER — Other Ambulatory Visit: Payer: Self-pay | Admitting: Student

## 2020-11-12 ENCOUNTER — Ambulatory Visit (INDEPENDENT_AMBULATORY_CARE_PROVIDER_SITE_OTHER): Payer: Medicare Other | Admitting: *Deleted

## 2020-11-12 DIAGNOSIS — N9489 Other specified conditions associated with female genital organs and menstrual cycle: Secondary | ICD-10-CM

## 2020-11-12 DIAGNOSIS — J309 Allergic rhinitis, unspecified: Secondary | ICD-10-CM | POA: Diagnosis not present

## 2020-11-13 DIAGNOSIS — E876 Hypokalemia: Secondary | ICD-10-CM | POA: Diagnosis not present

## 2020-11-13 DIAGNOSIS — D631 Anemia in chronic kidney disease: Secondary | ICD-10-CM | POA: Diagnosis not present

## 2020-11-13 DIAGNOSIS — D509 Iron deficiency anemia, unspecified: Secondary | ICD-10-CM | POA: Diagnosis not present

## 2020-11-13 DIAGNOSIS — Z992 Dependence on renal dialysis: Secondary | ICD-10-CM | POA: Diagnosis not present

## 2020-11-13 DIAGNOSIS — N2581 Secondary hyperparathyroidism of renal origin: Secondary | ICD-10-CM | POA: Diagnosis not present

## 2020-11-13 DIAGNOSIS — N186 End stage renal disease: Secondary | ICD-10-CM | POA: Diagnosis not present

## 2020-11-14 ENCOUNTER — Other Ambulatory Visit: Payer: Self-pay

## 2020-11-14 ENCOUNTER — Ambulatory Visit
Admission: RE | Admit: 2020-11-14 | Discharge: 2020-11-14 | Disposition: A | Discharge status: Home / Self Care | Payer: Medicare Other | Source: Ambulatory Visit | Attending: Student | Admitting: Student

## 2020-11-14 DIAGNOSIS — R1900 Intra-abdominal and pelvic swelling, mass and lump, unspecified site: Secondary | ICD-10-CM | POA: Diagnosis not present

## 2020-11-14 DIAGNOSIS — N9489 Other specified conditions associated with female genital organs and menstrual cycle: Secondary | ICD-10-CM

## 2020-11-14 DIAGNOSIS — Z90710 Acquired absence of both cervix and uterus: Secondary | ICD-10-CM | POA: Diagnosis not present

## 2020-11-14 DIAGNOSIS — N838 Other noninflammatory disorders of ovary, fallopian tube and broad ligament: Secondary | ICD-10-CM | POA: Diagnosis not present

## 2020-11-15 DIAGNOSIS — D509 Iron deficiency anemia, unspecified: Secondary | ICD-10-CM | POA: Diagnosis not present

## 2020-11-15 DIAGNOSIS — D631 Anemia in chronic kidney disease: Secondary | ICD-10-CM | POA: Diagnosis not present

## 2020-11-15 DIAGNOSIS — Z992 Dependence on renal dialysis: Secondary | ICD-10-CM | POA: Diagnosis not present

## 2020-11-15 DIAGNOSIS — E876 Hypokalemia: Secondary | ICD-10-CM | POA: Diagnosis not present

## 2020-11-15 DIAGNOSIS — N2581 Secondary hyperparathyroidism of renal origin: Secondary | ICD-10-CM | POA: Diagnosis not present

## 2020-11-15 DIAGNOSIS — N186 End stage renal disease: Secondary | ICD-10-CM | POA: Diagnosis not present

## 2020-11-18 DIAGNOSIS — N2581 Secondary hyperparathyroidism of renal origin: Secondary | ICD-10-CM | POA: Diagnosis not present

## 2020-11-18 DIAGNOSIS — D631 Anemia in chronic kidney disease: Secondary | ICD-10-CM | POA: Diagnosis not present

## 2020-11-18 DIAGNOSIS — N186 End stage renal disease: Secondary | ICD-10-CM | POA: Diagnosis not present

## 2020-11-18 DIAGNOSIS — D509 Iron deficiency anemia, unspecified: Secondary | ICD-10-CM | POA: Diagnosis not present

## 2020-11-18 DIAGNOSIS — E876 Hypokalemia: Secondary | ICD-10-CM | POA: Diagnosis not present

## 2020-11-18 DIAGNOSIS — Z992 Dependence on renal dialysis: Secondary | ICD-10-CM | POA: Diagnosis not present

## 2020-11-19 ENCOUNTER — Ambulatory Visit (INDEPENDENT_AMBULATORY_CARE_PROVIDER_SITE_OTHER): Payer: Medicare Other | Admitting: *Deleted

## 2020-11-19 DIAGNOSIS — J309 Allergic rhinitis, unspecified: Secondary | ICD-10-CM

## 2020-11-19 NOTE — Progress Notes (Signed)
VIALS EXP 11-19-21 

## 2020-11-20 DIAGNOSIS — E876 Hypokalemia: Secondary | ICD-10-CM | POA: Diagnosis not present

## 2020-11-20 DIAGNOSIS — N2581 Secondary hyperparathyroidism of renal origin: Secondary | ICD-10-CM | POA: Diagnosis not present

## 2020-11-20 DIAGNOSIS — D631 Anemia in chronic kidney disease: Secondary | ICD-10-CM | POA: Diagnosis not present

## 2020-11-20 DIAGNOSIS — D509 Iron deficiency anemia, unspecified: Secondary | ICD-10-CM | POA: Diagnosis not present

## 2020-11-20 DIAGNOSIS — J3081 Allergic rhinitis due to animal (cat) (dog) hair and dander: Secondary | ICD-10-CM | POA: Diagnosis not present

## 2020-11-20 DIAGNOSIS — N186 End stage renal disease: Secondary | ICD-10-CM | POA: Diagnosis not present

## 2020-11-20 DIAGNOSIS — Z992 Dependence on renal dialysis: Secondary | ICD-10-CM | POA: Diagnosis not present

## 2020-11-21 DIAGNOSIS — J3089 Other allergic rhinitis: Secondary | ICD-10-CM | POA: Diagnosis not present

## 2020-11-22 DIAGNOSIS — D509 Iron deficiency anemia, unspecified: Secondary | ICD-10-CM | POA: Diagnosis not present

## 2020-11-22 DIAGNOSIS — E876 Hypokalemia: Secondary | ICD-10-CM | POA: Diagnosis not present

## 2020-11-22 DIAGNOSIS — N186 End stage renal disease: Secondary | ICD-10-CM | POA: Diagnosis not present

## 2020-11-22 DIAGNOSIS — Z992 Dependence on renal dialysis: Secondary | ICD-10-CM | POA: Diagnosis not present

## 2020-11-22 DIAGNOSIS — D631 Anemia in chronic kidney disease: Secondary | ICD-10-CM | POA: Diagnosis not present

## 2020-11-22 DIAGNOSIS — N2581 Secondary hyperparathyroidism of renal origin: Secondary | ICD-10-CM | POA: Diagnosis not present

## 2020-11-24 DIAGNOSIS — I159 Secondary hypertension, unspecified: Secondary | ICD-10-CM | POA: Diagnosis not present

## 2020-11-24 DIAGNOSIS — Z992 Dependence on renal dialysis: Secondary | ICD-10-CM | POA: Diagnosis not present

## 2020-11-24 DIAGNOSIS — N186 End stage renal disease: Secondary | ICD-10-CM | POA: Diagnosis not present

## 2020-11-25 DIAGNOSIS — N186 End stage renal disease: Secondary | ICD-10-CM | POA: Diagnosis not present

## 2020-11-25 DIAGNOSIS — D631 Anemia in chronic kidney disease: Secondary | ICD-10-CM | POA: Diagnosis not present

## 2020-11-25 DIAGNOSIS — N2581 Secondary hyperparathyroidism of renal origin: Secondary | ICD-10-CM | POA: Diagnosis not present

## 2020-11-25 DIAGNOSIS — Z992 Dependence on renal dialysis: Secondary | ICD-10-CM | POA: Diagnosis not present

## 2020-11-25 DIAGNOSIS — D509 Iron deficiency anemia, unspecified: Secondary | ICD-10-CM | POA: Diagnosis not present

## 2020-11-25 DIAGNOSIS — E876 Hypokalemia: Secondary | ICD-10-CM | POA: Diagnosis not present

## 2020-11-26 ENCOUNTER — Encounter (INDEPENDENT_AMBULATORY_CARE_PROVIDER_SITE_OTHER): Payer: Medicare Other | Admitting: Ophthalmology

## 2020-11-26 ENCOUNTER — Ambulatory Visit (INDEPENDENT_AMBULATORY_CARE_PROVIDER_SITE_OTHER): Payer: Medicare Other | Admitting: *Deleted

## 2020-11-26 DIAGNOSIS — J309 Allergic rhinitis, unspecified: Secondary | ICD-10-CM

## 2020-11-27 DIAGNOSIS — E876 Hypokalemia: Secondary | ICD-10-CM | POA: Diagnosis not present

## 2020-11-27 DIAGNOSIS — D509 Iron deficiency anemia, unspecified: Secondary | ICD-10-CM | POA: Diagnosis not present

## 2020-11-27 DIAGNOSIS — D631 Anemia in chronic kidney disease: Secondary | ICD-10-CM | POA: Diagnosis not present

## 2020-11-27 DIAGNOSIS — N2581 Secondary hyperparathyroidism of renal origin: Secondary | ICD-10-CM | POA: Diagnosis not present

## 2020-11-27 DIAGNOSIS — N186 End stage renal disease: Secondary | ICD-10-CM | POA: Diagnosis not present

## 2020-11-27 DIAGNOSIS — Z992 Dependence on renal dialysis: Secondary | ICD-10-CM | POA: Diagnosis not present

## 2020-11-28 ENCOUNTER — Ambulatory Visit (INDEPENDENT_AMBULATORY_CARE_PROVIDER_SITE_OTHER): Payer: Medicare Other | Admitting: Ophthalmology

## 2020-11-28 ENCOUNTER — Other Ambulatory Visit: Payer: Self-pay

## 2020-11-28 ENCOUNTER — Encounter (INDEPENDENT_AMBULATORY_CARE_PROVIDER_SITE_OTHER): Payer: Self-pay | Admitting: Ophthalmology

## 2020-11-28 DIAGNOSIS — H43821 Vitreomacular adhesion, right eye: Secondary | ICD-10-CM | POA: Diagnosis not present

## 2020-11-28 DIAGNOSIS — H35371 Puckering of macula, right eye: Secondary | ICD-10-CM

## 2020-11-28 DIAGNOSIS — E113512 Type 2 diabetes mellitus with proliferative diabetic retinopathy with macular edema, left eye: Secondary | ICD-10-CM

## 2020-11-28 MED ORDER — BEVACIZUMAB 2.5 MG/0.1ML IZ SOSY
2.5000 mg | PREFILLED_SYRINGE | INTRAVITREAL | Status: AC | PRN
  Administered 2020-11-28: 2.5 mg via INTRAVITREAL

## 2020-11-28 NOTE — Progress Notes (Signed)
11/28/2020     CHIEF COMPLAINT Patient presents for Retina Follow Up (3wk fu OS/ Avastin OS//Pt states, "My va is ok and I do not have any floaters at all now.")   HISTORY OF PRESENT ILLNESS: Destiny Day is a 70 y.o. female who presents to the clinic today for:   HPI    Retina Follow Up    Diagnosis: Diabetic Retinopathy   Laterality: left eye   Onset: 3 weeks ago   Duration: 3 weeks   Comments: 3wk fu OS/ Avastin OS  Pt states, "My va is ok and I do not have any floaters at all now."       Last edited by Kendra Opitz, COA on 11/28/2020 10:17 AM. (History)      Referring physician: Cipriano Mile, NP [place],  [place] [address]  HISTORICAL INFORMATION:   Selected notes from the [facility]    Lab Results  Component Value Date   HGBA1C 5.6 06/26/2019     CURRENT MEDICATIONS: No current outpatient medications on file. (Ophthalmic Drugs)   No current facility-administered medications for this visit. (Ophthalmic Drugs)   Current Outpatient Medications (Other)  Medication Sig  . albuterol (PROAIR HFA) 108 (90 Base) MCG/ACT inhaler 4 puffs every 4-6 hours as needed (Patient taking differently: Inhale 4 puffs into the lungs every 4 (four) hours as needed for wheezing or shortness of breath. 4 puffs every 4-6 hours as needed)  . amLODipine (NORVASC) 10 MG tablet Take 10 mg by mouth at bedtime.  Marland Kitchen aspirin EC 81 MG tablet Take 81 mg by mouth daily.  Marland Kitchen atorvastatin (LIPITOR) 40 MG tablet Take 1 tablet (40 mg total) by mouth daily.  Marland Kitchen azelastine (ASTELIN) 0.1 % nasal spray Place 1 spray into both nostrils 2 (two) times daily.  Marland Kitchen BIDIL 20-37.5 MG tablet TAKE 1 TABLET BY MOUTH THREE TIMES A DAY  . budesonide-formoterol (SYMBICORT) 160-4.5 MCG/ACT inhaler 2 puffs twice daily with spacer (Patient taking differently: Inhale into the lungs in the morning and at bedtime. 2 puffs twice daily with spacer)  . busPIRone (BUSPAR) 7.5 MG tablet Take 7.5 mg  by mouth 2 (two) times daily.  . carvedilol (COREG) 12.5 MG tablet Take 12.5 mg by mouth See admin instructions. Take 1 tablet (12.5 mg) by mouth once daily in the evening on Mondays, Wednesdays, & Fridays. (Dialysis) Take 1 tablet (12.5 mg) by mouth twice daily on Sundays, Tuesdays, Thursdays, & Saturdays. (Non Dialysis)  . cinacalcet (SENSIPAR) 60 MG tablet Take 90 mg by mouth See admin instructions. Take 1 tablet (90 mg) by mouth in the evening on Mondays, Wednesdays, & Fridays (dialysis) Take 1 tablet (90 mg) by mouth twice daily on Sundays, Tuesdays, Thursdays, Saturdays. (non dialysis)  . docusate sodium (COLACE) 100 MG capsule Take 1 capsule (100 mg total) by mouth 2 (two) times daily.  Marland Kitchen EPINEPHrine 0.3 mg/0.3 mL IJ SOAJ injection Inject 0.3 mLs (0.3 mg total) into the muscle as needed for anaphylaxis.  . ferric citrate (AURYXIA) 1 GM 210 MG(Fe) tablet Take 210-420 mg by mouth See admin instructions. Take 2 tablets (420 mg) by mouth with each meal & take 1 tablet (210 mg) by mouth with snacks.  . fluticasone (FLONASE) 50 MCG/ACT nasal spray Place 2 sprays into both nostrils daily as needed for allergies or rhinitis.  Marland Kitchen gabapentin (NEURONTIN) 100 MG capsule TAKE 1 CAPSULE (100 MG TOTAL) BY MOUTH AT BEDTIME. WHEN NECESSARY FOR NEUROPATHY PAIN (Patient taking differently:  Take 100 mg by mouth at bedtime as needed (pain). When necessary for neuropathy pain)  . glipiZIDE (GLUCOTROL) 5 MG tablet Take 5 mg by mouth daily before breakfast.  . lidocaine-prilocaine (EMLA) cream Apply 1 application topically 3 (three) times a week. 1-2 hours prior to dialysis  . loperamide (IMODIUM) 2 MG capsule Take 2 mg by mouth every 4 (four) hours as needed (diarrhea).  . montelukast (SINGULAIR) 10 MG tablet TAKE 1 TABLET BY MOUTH EVERYDAY AT BEDTIME  . multivitamin (RENA-VIT) TABS tablet Take 1 tablet by mouth daily.   Marland Kitchen omeprazole (PRILOSEC) 20 MG capsule Take 20 mg by mouth daily.   . ondansetron (ZOFRAN) 4 MG  tablet Take 1 tablet (4 mg total) by mouth every 6 (six) hours as needed for nausea.  Marland Kitchen oxyCODONE-acetaminophen (PERCOCET) 10-325 MG tablet Take 1 tablet by mouth every 6 (six) hours as needed for pain.  . polyethylene glycol (MIRALAX / GLYCOLAX) 17 g packet Take 17 g by mouth daily.  . traZODone (DESYREL) 50 MG tablet Take 50 mg by mouth at bedtime as needed for sleep.  . Vitamin D, Ergocalciferol, (DRISDOL) 1.25 MG (50000 UT) CAPS capsule Take 50,000 Units by mouth every Sunday. Takes on Sunday   No current facility-administered medications for this visit. (Other)      REVIEW OF SYSTEMS:    ALLERGIES Allergies  Allergen Reactions  . Adhesive  [Tape] Hives  . Lisinopril Cough  . Prednisone Swelling and Other (See Comments)    Excessive fluid buildup    PAST MEDICAL HISTORY Past Medical History:  Diagnosis Date  . Abdominal bruit   . Anemia   . Anxiety   . Arthritis    Osteoarthritis  . Asthma   . Cervical disc disease    "pinced nerve"  . CHF (congestive heart failure) ([place])   . Complication of anesthesia    " after I got home from my last procedure, I started itching."  . Diabetes mellitus    Type II  . Diverticulitis   . ESRD (end stage renal disease) ([place])    dialysis - M/W/F- [place]  . GERD (gastroesophageal reflux disease)    from medications  . GI bleed 03/31/2013  . Head injury 07/2017  . History of hiatal hernia   . Hyperlipidemia   . Hypertension   . Neuropathy    left leg  . Osteoporosis   . Peripheral vascular disease ([place])   . Pneumonia    "very young" and a few years ago  . PONV (postoperative nausea and vomiting)   . Seasonal allergies   . Shortness of breath dyspnea    WIth exertion, when fluid builds  . Sleep apnea    can't afford cpap    Past Surgical History:  Procedure Laterality Date  . A/V SHUNTOGRAM N/A 09/22/2016   Procedure: A/V Shuntogram - left arm;  Surgeon: Serafina Mitchell, MD;  Location: [facility];  Service:  Cardiovascular;  Laterality: N/A;  . A/V SHUNTOGRAM N/A 03/22/2018   Procedure: A/V SHUNTOGRAM - left arm;  Surgeon: Serafina Mitchell, MD;  Location: [facility];  Service: Cardiovascular;  Laterality: N/A;  . A/V SHUNTOGRAM Left 11/17/2018   Procedure: A/V SHUNTOGRAM;  Surgeon: Angelia Mould, MD;  Location: [facility];  Service: Cardiovascular;  Laterality: Left;  . ABDOMINAL HYSTERECTOMY  1993`  . AMPUTATION Left 09/01/2016   Procedure: LEFT FOOT TRANSMETATARSAL AMPUTATION;  Surgeon: Newt Minion, MD;  Location: [facility];  Service: Orthopedics;  Laterality: Left;  . AMPUTATION Right 08/11/2017   Procedure: RIGHT GREAT TOE AMPUTATION DIGIT;  Surgeon: Rosetta Posner, MD;  Location: [facility];  Service: Vascular;  Laterality: Right;  . AMPUTATION Right 12/31/2017   Procedure: RIGHT TRANSMETATARSAL AMPUTATION;  Surgeon: Newt Minion, MD;  Location: [facility];  Service: Orthopedics;  Laterality: Right;  . AV FISTULA PLACEMENT Left 04/21/2016   Procedure: INSERTION OF ARTERIOVENOUS (AV) GORE-TEX GRAFT ARM LEFT;  Surgeon: Elam Dutch, MD;  Location: [facility];  Service: Vascular;  Laterality: Left;  . [place] TRANSPOSITION Left 07/10/2014   Procedure: BASCILIC VEIN TRANSPOSITION;  Surgeon: Angelia Mould, MD;  Location: [facility];  Service: Vascular;  Laterality: Left;  . BASCILIC VEIN TRANSPOSITION Right 11/08/2014   Procedure: FIRST STAGE BASILIC VEIN TRANSPOSITION;  Surgeon: Angelia Mould, MD;  Location: [facility];  Service: Vascular;  Laterality: Right;  . BASCILIC VEIN TRANSPOSITION Right 01/18/2015   Procedure: SECOND STAGE BASILIC VEIN TRANSPOSITION;  Surgeon: Angelia Mould, MD;  Location: [facility];  Service: Vascular;  Laterality: Right;  . CRANIOTOMY N/A 08/23/2017   Procedure: CRANIOTOMY HEMATOMA EVACUATION SUBDURAL;  Surgeon: Ashok Pall, MD;  Location: [facility];  Service: Neurosurgery;  Laterality: N/A;  . CRANIOTOMY Left 08/24/2017   Procedure: CRANIOTOMY FOR  RECURRENT ACUTE SUBDURAL HEMATOMA;  Surgeon: Ashok Pall, MD;  Location: [facility];  Service: Neurosurgery;  Laterality: Left;  . ESOPHAGOGASTRODUODENOSCOPY N/A 03/31/2013   Procedure: ESOPHAGOGASTRODUODENOSCOPY (EGD);  Surgeon: Gatha Mayer, MD;  Location: [facility] ENDOSCOPY;  Service: Endoscopy;  Laterality: N/A;  . EYE SURGERY     laser surgery  . FEMORAL-POPLITEAL BYPASS GRAFT Left 07/08/2016   Procedure: LEFT  FEMORAL-BELOW KNEE POPLITEAL ARTERY BYPASS GRAFT USING 6MM X 80 CM PROPATEN GORETEX GRAFT WITH RINGS.;  Surgeon: Rosetta Posner, MD;  Location: [facility];  Service: Vascular;  Laterality: Left;  . FEMORAL-POPLITEAL BYPASS GRAFT Right 08/11/2017   Procedure: RIGHT FEMORAL TO BELOW KNEE POPLITEAL ARTERKY  BYPASS GRAFT USING 6MM RINGED PROPATEN GRAFT;  Surgeon: Rosetta Posner, MD;  Location: [facility];  Service: Vascular;  Laterality: Right;  . FISTULOGRAM Left 10/29/2014   Procedure: FISTULOGRAM;  Surgeon: Angelia Mould, MD;  Location: [facility] C[facility] LAB;  Service: Cardiovascular;  Laterality: Left;  Marland Kitchen GAS/FLUID EXCHANGE Left 12/20/2018   Procedure: AIR GAS EXCHANGE;  Surgeon: Jalene Mullet, MD;  Location: [facility];  Service: Ophthalmology;  Laterality: Left;  . INSERTION OF DIALYSIS CATHETER N/A 09/05/2020   Procedure: INSERTION OF DIALYSIS CATHETER;  Surgeon: Cherre Robins, MD;  Location: [facility];  Service: Vascular;  Laterality: N/A;  . KNEE ARTHROSCOPY Right 05/06/2018   Procedure: RIGHT KNEE ARTHROSCOPY;  Surgeon: Newt Minion, MD;  Location: [facility];  Service: Orthopedics;  Laterality: Right;  . KNEE ARTHROSCOPY Right 06/10/2018   Procedure: RIGHT KNEE ARTHROSCOPY AND DEBRIDEMENT;  Surgeon: Newt Minion, MD;  Location: [facility];  Service: Orthopedics;  Laterality: Right;  . LIGATION OF ARTERIOVENOUS  FISTULA Left 04/21/2016   Procedure: LIGATION OF ARTERIOVENOUS  FISTULA LEFT ARM;  Surgeon: Elam Dutch, MD;  Location: [facility];  Service: Vascular;  Laterality: Left;  . LOWER EXTREMITY ANGIOGRAPHY N/A  04/06/2017   Procedure: Lower Extremity Angiography - Right;  Surgeon: Serafina Mitchell, MD;  Location: [facility];  Service: Cardiovascular;  Laterality: N/A;  . MEMBRANE PEEL Left 12/20/2018   Procedure: MEMBRANE PEEL;  Surgeon: Jalene Mullet, MD;  Location: [facility];  Service: Ophthalmology;  Laterality:  Left;  . PATCH ANGIOPLASTY Right 01/18/2015   Procedure: BASILIC VEIN PATCH ANGIOPLASTY USING VASCUGUARD PATCH;  Surgeon: Angelia Mould, MD;  Location: [facility];  Service: Vascular;  Laterality: Right;  . PERIPHERAL VASCULAR BALLOON ANGIOPLASTY  09/22/2016   Procedure: Peripheral Vascular Balloon Angioplasty;  Surgeon: Serafina Mitchell, MD;  Location: [facility];  Service: Cardiovascular;;  Lt. Fistula  . PERIPHERAL VASCULAR BALLOON ANGIOPLASTY Left 03/22/2018   Procedure: PERIPHERAL VASCULAR BALLOON ANGIOPLASTY;  Surgeon: Serafina Mitchell, MD;  Location: [facility];  Service: Cardiovascular;  Laterality: Left;  Arm shunt  . PERIPHERAL VASCULAR CATHETERIZATION N/A 06/23/2016   Procedure: Abdominal Aortogram w/Lower Extremity;  Surgeon: Serafina Mitchell, MD;  Location: [facility];  Service: Cardiovascular;  Laterality: N/A;  . PERIPHERAL VASCULAR CATHETERIZATION  06/23/2016   Procedure: Peripheral Vascular Intervention;  Surgeon: Serafina Mitchell, MD;  Location: [facility];  Service: Cardiovascular;;  lt common and external illiac artery  . PHOTOCOAGULATION WITH LASER Left 12/20/2018   Procedure: PHOTOCOAGULATION WITH LASER;  Surgeon: Jalene Mullet, MD;  Location: [facility];  Service: Ophthalmology;  Laterality: Left;  . 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   Procedure: REPAIR OF COMPLEX TRACTION RETINAL DETACHMENT;  Surgeon: Jalene Mullet, MD;  Location: [facility];  Service: Ophthalmology;  Laterality: Left;  . REVISION OF ARTERIOVENOUS GORETEX GRAFT Left 09/05/2020   Procedure: REVISION OF ARTERIOVENOUS GORETEX GRAFT LEFT;  Surgeon: Cherre Robins, MD;  Location: [facility];  Service: Vascular;  Laterality: Left;    FAMILY HISTORY Family History  Problem Relation Age of Onset  . Other Mother        not sure of cause of death  . Diabetes Father   . Pancreatic cancer Maternal Grandmother   . Diabetes Sister   . Diabetes Sister   . Colon cancer Neg Hx     SOCIAL HISTORY Social History   Tobacco Use  . Smoking status: Former Smoker    Packs/day: 0.35    Years: 40.00    Pack years: 14.00    Types: Cigarettes    Quit date: 05/10/2012    Years since quitting: 8.5  . Smokeless tobacco: Never Used  Vaping Use  . Vaping Use: Never used  Substance Use Topics  . Alcohol use: No  . Drug use: No    Comment: marijuana; quit in early 1980's         OPHTHALMIC EXAM: Base Eye Exam    Visual Acuity (ETDRS)      Right Left   Dist cc 20/60 20/60   Dist ph cc NI NI   Correction: Glasses       Tonometry (Tonopen, 10:23 AM)      Right Left   Pressure 19 20       Pupils      Pupils Dark Light Shape React APD   Right PERRL 3 3 Round Minimal None   Left PERRL 3 3 Round Minimal None       Visual Fields      Left Right    Full    Restrictions  Partial outer inferior nasal deficiency       Neuro/Psych    Oriented x3: Yes   Mood/Affect: Normal       Dilation    Left eye: 1.0% Mydriacyl, 2.5% Phenylephrine @ 10:23 AM        Slit Lamp and Fundus Exam    External Exam  Right Left   External Normal Normal       Slit Lamp Exam      Right Left   Lids/Lashes  Normal   Conjunctiva/Sclera  White and quiet   Cornea  Clear   Anterior Chamber  Deep and quiet   Iris  Round and reactive   Lens Centered posterior chamber intraocular lens Centered posterior chamber intraocular lens   Anterior Vitreous  Normal, remnant       Fundus Exam      Right Left   Posterior Vitreous  Vitrectomized, by history   Disc  No details through cataract   C/D Ratio  0.3   Macula  CSME at and temporal to the fovea    Vessels  Good PRP, active NVE temporally and inferiorly   Periphery   attached peripherally          IMAGING AND PROCEDURES  Imaging and Procedures for 11/28/20  OCT, Retina - OU - Both Eyes       Right Eye Scan locations included subfoveal. Central Foveal Thickness: 378. Progression has improved. Findings include abnormal foveal contour, vitreous traction, vitreomacular adhesion .   Left Eye Quality was good. Scan locations included subfoveal, extrafoveal. Central Foveal Thickness: 433. Progression has been stable. Findings include abnormal foveal contour.   Notes No signs of crunch syndrome OD after intravitreal injection Avastin, vitreomacular traction and adhesion remains yet less CSME,  Traction remains with vitreomacular elevation will need 1 day need vitrectomy membrane peel right eye of the vitreoretinal traction to maximize acuity and prevent detachment       Intravitreal Injection, Pharmacologic Agent - OS - Left Eye       Time Out 11/28/2020. 11:26 AM. Confirmed correct patient, procedure, site, and patient consented.   Anesthesia Topical anesthesia was used. Anesthetic medications included Akten 3.5%.   Procedure Preparation included 5% betadine to ocular surface, 10% betadine to eyelids, Tobramycin 0.3%. A 30 gauge needle was used.   Injection:  2.5 mg Bevacizumab (AVASTIN) 2.5mg /0.66mL SOSY   NDC: 62947-654-65, Lot: [id number]   Route: Intravitreal, Site: Left Eye  Post-op Post injection exam found visual acuity of at least counting fingers. The patient tolerated the procedure well. There were no complications. The patient received written and verbal post procedure care education. Post injection medications were not given.                 ASSESSMENT/PLAN:  Vitreomacular traction syndrome, right Tractional change on the fovea with secondary visual acuity declines and risk of traction retinal detachment.  We will consider vitrectomy and release of  vitreal macular traction right eye under local anesthesia in the near future.  No change overall post injection Avastin OD  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 type 2 diabetes mellitus (HCC) Massive CSME OS, needs intravitreal Avastin to stabilize  Macular pucker, right eye Moderate-severe with impact on acuity      ICD-10-CM   1. 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 type 2 diabetes mellitus (HCC)  C12.7517 OCT, Retina - OU - Both Eyes    Intravitreal Injection, Pharmacologic Agent - OS - Left Eye    bevacizumab (AVASTIN) SOSY 2.5 mg  2. Vitreomacular traction syndrome, right  H43.821   3. Macular pucker, right eye  H35.371     1.  CSME OS worsening, will need intravitreal Avastin today to stabilize  2.  3.  Ophthalmic Meds Ordered this visit:  Meds  ordered this encounter  Medications  . bevacizumab (AVASTIN) SOSY 2.5 mg       Return in about 5 weeks (around 01/02/2021) for dilate, OS, AVASTIN OCT.  There are no Patient Instructions on file for this visit.   Explained the diagnoses, plan, and follow up with the patient and they expressed understanding.  Patient expressed understanding of the importance of proper follow up care.   Clent Demark Mysha Peeler M.D. Diseases & Surgery of the Retina and Vitreous [facility] 11/28/20     Abbreviations: M myopia (nearsighted); A astigmatism; H hyperopia (farsighted); P presbyopia; Mrx spectacle prescription;  CTL contact lenses; OD right eye; OS left eye; OU both eyes  XT exotropia; ET esotropia; PEK punctate epithelial keratitis; PEE punctate epithelial erosions; DES dry eye syndrome; MGD meibomian gland dysfunction; ATs artificial tears; PFAT's preservative free artificial tears; [place] nuclear sclerotic cataract; PSC posterior subcapsular cataract; ERM epi-retinal membrane; PVD posterior vitreous detachment; RD retinal detachment; DM  diabetes mellitus; DR diabetic retinopathy; NPDR non-proliferative diabetic retinopathy; PDR proliferative diabetic retinopathy; CSME clinically significant macular edema; DME diabetic macular edema; dbh dot blot hemorrhages; CWS cotton wool spot; POAG primary open angle glaucoma; C/D cup-to-disc ratio; HVF humphrey visual field; GVF goldmann visual field; OCT optical coherence tomography; IOP intraocular pressure; BRVO Branch retinal vein occlusion; CRVO central retinal vein occlusion; CRAO central retinal artery occlusion; BRAO branch retinal artery occlusion; RT retinal tear; SB scleral buckle; PPV pars plana vitrectomy; VH Vitreous hemorrhage; PRP panretinal laser photocoagulation; IVK intravitreal kenalog; VMT vitreomacular traction; MH Macular hole;  NVD neovascularization of the disc; NVE neovascularization elsewhere; AREDS age related eye disease study; ARMD age related macular degeneration; POAG primary open angle glaucoma; EBMD epithelial/anterior basement membrane dystrophy; ACIOL anterior chamber intraocular lens; IOL intraocular lens; PCIOL posterior chamber intraocular lens; Phaco/IOL phacoemulsification with intraocular lens placement; [place] photorefractive keratectomy; LASIK laser assisted in situ keratomileusis; HTN hypertension; DM diabetes mellitus; COPD chronic obstructive pulmonary disease

## 2020-11-28 NOTE — Assessment & Plan Note (Signed)
Moderate-severe with impact on acuity

## 2020-11-28 NOTE — Assessment & Plan Note (Signed)
Massive CSME OS, needs intravitreal Avastin to stabilize

## 2020-11-28 NOTE — Assessment & Plan Note (Signed)
Tractional change on the fovea with secondary visual acuity declines and risk of traction retinal detachment.  We will consider vitrectomy and release of vitreal macular traction right eye under local anesthesia in the near future.  No change overall post injection Avastin OD

## 2020-11-29 DIAGNOSIS — D631 Anemia in chronic kidney disease: Secondary | ICD-10-CM | POA: Diagnosis not present

## 2020-11-29 DIAGNOSIS — D509 Iron deficiency anemia, unspecified: Secondary | ICD-10-CM | POA: Diagnosis not present

## 2020-11-29 DIAGNOSIS — N186 End stage renal disease: Secondary | ICD-10-CM | POA: Diagnosis not present

## 2020-11-29 DIAGNOSIS — N2581 Secondary hyperparathyroidism of renal origin: Secondary | ICD-10-CM | POA: Diagnosis not present

## 2020-11-29 DIAGNOSIS — E876 Hypokalemia: Secondary | ICD-10-CM | POA: Diagnosis not present

## 2020-11-29 DIAGNOSIS — Z992 Dependence on renal dialysis: Secondary | ICD-10-CM | POA: Diagnosis not present

## 2020-12-02 DIAGNOSIS — D509 Iron deficiency anemia, unspecified: Secondary | ICD-10-CM | POA: Diagnosis not present

## 2020-12-02 DIAGNOSIS — D631 Anemia in chronic kidney disease: Secondary | ICD-10-CM | POA: Diagnosis not present

## 2020-12-02 DIAGNOSIS — N2581 Secondary hyperparathyroidism of renal origin: Secondary | ICD-10-CM | POA: Diagnosis not present

## 2020-12-02 DIAGNOSIS — N186 End stage renal disease: Secondary | ICD-10-CM | POA: Diagnosis not present

## 2020-12-02 DIAGNOSIS — Z992 Dependence on renal dialysis: Secondary | ICD-10-CM | POA: Diagnosis not present

## 2020-12-02 DIAGNOSIS — E876 Hypokalemia: Secondary | ICD-10-CM | POA: Diagnosis not present

## 2020-12-03 ENCOUNTER — Ambulatory Visit (INDEPENDENT_AMBULATORY_CARE_PROVIDER_SITE_OTHER): Payer: Medicare Other | Admitting: *Deleted

## 2020-12-03 DIAGNOSIS — J309 Allergic rhinitis, unspecified: Secondary | ICD-10-CM | POA: Diagnosis not present

## 2020-12-04 DIAGNOSIS — E876 Hypokalemia: Secondary | ICD-10-CM | POA: Diagnosis not present

## 2020-12-04 DIAGNOSIS — D509 Iron deficiency anemia, unspecified: Secondary | ICD-10-CM | POA: Diagnosis not present

## 2020-12-04 DIAGNOSIS — N2581 Secondary hyperparathyroidism of renal origin: Secondary | ICD-10-CM | POA: Diagnosis not present

## 2020-12-04 DIAGNOSIS — N186 End stage renal disease: Secondary | ICD-10-CM | POA: Diagnosis not present

## 2020-12-04 DIAGNOSIS — D631 Anemia in chronic kidney disease: Secondary | ICD-10-CM | POA: Diagnosis not present

## 2020-12-04 DIAGNOSIS — Z992 Dependence on renal dialysis: Secondary | ICD-10-CM | POA: Diagnosis not present

## 2020-12-06 DIAGNOSIS — E876 Hypokalemia: Secondary | ICD-10-CM | POA: Diagnosis not present

## 2020-12-06 DIAGNOSIS — D509 Iron deficiency anemia, unspecified: Secondary | ICD-10-CM | POA: Diagnosis not present

## 2020-12-06 DIAGNOSIS — Z992 Dependence on renal dialysis: Secondary | ICD-10-CM | POA: Diagnosis not present

## 2020-12-06 DIAGNOSIS — N186 End stage renal disease: Secondary | ICD-10-CM | POA: Diagnosis not present

## 2020-12-06 DIAGNOSIS — N2581 Secondary hyperparathyroidism of renal origin: Secondary | ICD-10-CM | POA: Diagnosis not present

## 2020-12-06 DIAGNOSIS — D631 Anemia in chronic kidney disease: Secondary | ICD-10-CM | POA: Diagnosis not present

## 2020-12-09 DIAGNOSIS — N2581 Secondary hyperparathyroidism of renal origin: Secondary | ICD-10-CM | POA: Diagnosis not present

## 2020-12-09 DIAGNOSIS — N186 End stage renal disease: Secondary | ICD-10-CM | POA: Diagnosis not present

## 2020-12-09 DIAGNOSIS — Z992 Dependence on renal dialysis: Secondary | ICD-10-CM | POA: Diagnosis not present

## 2020-12-09 DIAGNOSIS — D631 Anemia in chronic kidney disease: Secondary | ICD-10-CM | POA: Diagnosis not present

## 2020-12-09 DIAGNOSIS — E876 Hypokalemia: Secondary | ICD-10-CM | POA: Diagnosis not present

## 2020-12-09 DIAGNOSIS — D509 Iron deficiency anemia, unspecified: Secondary | ICD-10-CM | POA: Diagnosis not present

## 2020-12-10 ENCOUNTER — Ambulatory Visit (INDEPENDENT_AMBULATORY_CARE_PROVIDER_SITE_OTHER): Payer: Medicare Other | Admitting: *Deleted

## 2020-12-10 ENCOUNTER — Other Ambulatory Visit: Payer: Self-pay | Admitting: *Deleted

## 2020-12-10 DIAGNOSIS — J309 Allergic rhinitis, unspecified: Secondary | ICD-10-CM | POA: Diagnosis not present

## 2020-12-10 DIAGNOSIS — N186 End stage renal disease: Secondary | ICD-10-CM | POA: Diagnosis not present

## 2020-12-10 DIAGNOSIS — Z992 Dependence on renal dialysis: Secondary | ICD-10-CM | POA: Diagnosis not present

## 2020-12-10 DIAGNOSIS — Z452 Encounter for adjustment and management of vascular access device: Secondary | ICD-10-CM | POA: Diagnosis not present

## 2020-12-10 NOTE — Patient Instructions (Addendum)
Goals Addressed            This Visit's Progress   . [facility]) Track and Manage Fluids and Swelling       Timeframe:  Long-Range Goal Priority:  High Start Date:  05/30/20                          Expected End Date: 06/25/21                      Follow Up Date 03/25/21   - call office if I gain more than 2 pounds in one day or 5 pounds in one week - keep legs up while sitting - track weight in diary - use salt in moderation - watch for swelling in feet, ankles and legs every day  -Encouraged patient to eat a low sodium diet -Discussed bringing the dialysis printout with her weight and B/P values to her doctor's appointments for review   Why is this important?   It is important to check your weight daily and watch how much salt and liquids you have.  It will help you to manage your heart failure.    Notes: Patient received [facility] CHF packet that has CHF zones and actions plans. 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 Dialysis does provide her with a printout of her recorded weights and B/P  Updated 12/10/20: Patient continues to be weighed and have her B/P taken 3 times weekly during dialysis. She does receive a printout of her values.  Patient reports that she did not receive the child B/P monitor nurse attempted to send her previously and requested that this nurse send some hand sanitizer and masks if available.     Marland Kitchen [facility]) Track and Manage My Blood Pressure-Hypertension       Timeframe:  Long-Range Goal Priority:  High Start Date: 12/10/20                            Expected End Date: 12/23/20                       Follow Up Date 03/25/21    - check blood pressure daily - write blood pressure results in a log or diary  -Encouraged patient to eat a diet low in sodium -Discussed bringing dialysis printout with her B/P values and weight to her doctor appointments for them to review   Why is this  important?    You won't feel high blood pressure, but it can still hurt your blood vessels.   High blood pressure can cause heart or kidney problems. It can also cause a stroke.   Making lifestyle changes like losing a little weight or eating less salt will help.   Checking your blood pressure at home and at different times of the day can help to control blood pressure.   If the doctor prescribes medicine remember to take it the way the doctor ordered.   Call the office if you cannot afford the medicine or if there are questions about it.     Notes: 12/10/20: Patient reports that she did not get the child size B/P monitor that this nurse attempted to send her previously. Nurse discussed high value of 209/85 the patient had on 10/24/20 at her doctor's appointment. Patient explained that  her B/P has wide ranges and it will elevate very high if she has been walking but then will go back down after she rests. Patient reports she has an upcoming appointment with Dr. Einar Gip on 01/09/21. Nurse encouraged her to discuss any B/P  issues with her doctor and to bring her printouts of her weight and B/P to her doctor appointments.     Marland Kitchen [facility]) Track and Manage Symptoms       Timeframe:  Long-Range Goal Priority:  High Start Date: 05/30/20                            Expected End Date: 06/25/21                      Follow Up Date 03/25/21   - develop a rescue plan - follow rescue plan if symptoms flare-up - know when to call the doctor - track symptoms and what helps feel better or worse - dress right for the weather, hot or cold    Why is this important?   You will be able to handle your symptoms better if you keep track of them.  Making some simple changes to your lifestyle will help.  Eating healthy is one thing you can do to take good care of yourself.    Notes: Patient received  B/P cuff but states it is too big and she has difficulty using the device. Nurse will check to see if [facility] has a smaller  B/P cuff. Patient gets weighed and has her B/P taken 3 times weekly at dialysis where it is recorded and given to her on a printout.  Patient states she received CHF education with zones and action plans.  Updated: 07/11/20  Updated 12/10/20: Patient reports that she continues to be weighed and have her B/P taken 3 times weekly during dialysis. She does receive a printout of her values monthly and states that her weight values have been consistent. Patient has an upcoming cardiology appointment 01/09/21.    . Track and Manage Activity and Exertion       Timeframe:  Long-Range Goal Priority:  High Start Date:  05/30/20                           Expected End Date: 03/25/21                   Follow Up Date 03/25/21   - follow activity or exercise plan - make an activity or exercise plan    Why is this important?   Exercising is very important when managing your heart failure.  It will help your heart get stronger.    Notes: Patient wants to stay as active and independent as possible. She wants to  maintain her strength and mobility. Patient received exercise printouts and states that she runs many errands and has a lot of provider appointments on her off days of dialysis which has increased the amount she walks.   Updated 12/10/20: Patient reports continuation of being active due to going to dialysis, running errands, and going to many scheduled provider appointments on her off dialysis days. She reports tolerating this activity well.        Hypertension, Adult Hypertension is another name for high blood pressure. High blood pressure forces your heart to work harder to pump blood. This can cause problems over time. There are  two numbers in a blood pressure reading. There is a top number (systolic) over a bottom number (diastolic). It is best to have a blood pressure that is below 120/80. Healthy choices can help lower your blood pressure, or you may need medicine to help lower it. What are the  causes? The cause of this condition is not known. Some conditions may be related to high blood pressure. What increases the risk?  Smoking.  Having type 2 diabetes mellitus, high cholesterol, or both.  Not getting enough exercise or physical activity.  Being overweight.  Having too much fat, sugar, calories, or salt (sodium) in your diet.  Drinking too much alcohol.  Having long-term (chronic) kidney disease.  Having a family history of high blood pressure.  Age. Risk increases with age.  Race. You may be at higher risk if you are African American.  Gender. Men are at higher risk than women before age 18. After age 107, women are at higher risk than men.  Having obstructive sleep apnea.  Stress. What are the signs or symptoms?  High blood pressure may not cause symptoms. Very high blood pressure (hypertensive crisis) may cause: ? Headache. ? Feelings of worry or nervousness (anxiety). ? Shortness of breath. ? Nosebleed. ? A feeling of being sick to your stomach (nausea). ? Throwing up (vomiting). ? Changes in how you see. ? Very bad chest pain. ? Seizures. How is this treated?  This condition is treated by making healthy lifestyle changes, such as: ? Eating healthy foods. ? Exercising more. ? Drinking less alcohol.  Your health care provider may prescribe medicine if lifestyle changes are not enough to get your blood pressure under control, and if: ? Your top number is above 130. ? Your bottom number is above 80.  Your personal target blood pressure may vary. Follow these instructions at home: Eating and drinking  If told, follow the DASH eating plan. To follow this plan: ? Fill one half of your plate at each meal with fruits and vegetables. ? Fill one fourth of your plate at each meal with whole grains. Whole grains include whole-wheat pasta, brown rice, and whole-grain bread. ? Eat or drink low-fat dairy products, such as skim milk or low-fat  yogurt. ? Fill one fourth of your plate at each meal with low-fat (lean) proteins. Low-fat proteins include fish, chicken without skin, eggs, beans, and tofu. ? Avoid fatty meat, cured and processed meat, or chicken with skin. ? Avoid pre-made or processed food.  Eat less than 1,500 mg of salt each day.  Do not drink alcohol if: ? Your doctor tells you not to drink. ? You are pregnant, may be pregnant, or are planning to become pregnant.  If you drink alcohol: ? Limit how much you use to:  0-1 drink a day for women.  0-2 drinks a day for men. ? Be aware of how much alcohol is in your drink. In the [place] one drink equals one 12 oz bottle of beer (355 mL), one 5 oz glass of Samyak Sackmann (148 mL), or one 1 oz glass of hard liquor (44 mL).   Lifestyle  Work with your doctor to stay at a healthy weight or to lose weight. Ask your doctor what the best weight is for you.  Get at least 30 minutes of exercise most days of the week. This may include walking, swimming, or biking.  Get at least 30 minutes of exercise that strengthens your muscles (resistance exercise) at least 3 days  a week. This may include lifting weights or doing Pilates.  Do not use any products that contain nicotine or tobacco, such as cigarettes, e-cigarettes, and chewing tobacco. If you need help quitting, ask your doctor.  Check your blood pressure at home as told by your doctor.  Keep all follow-up visits as told by your doctor. This is important.   Medicines  Take over-the-counter and prescription medicines only as told by your doctor. Follow directions carefully.  Do not skip doses of blood pressure medicine. The medicine does not work as well if you skip doses. Skipping doses also puts you at risk for problems.  Ask your doctor about side effects or reactions to medicines that you should watch for. Contact a doctor if you:  Think you are having a reaction to the medicine you are taking.  Have headaches that keep  coming back (recurring).  Feel dizzy.  Have swelling in your ankles.  Have trouble with your vision. Get help right away if you:  Get a very bad headache.  Start to feel mixed up (confused).  Feel weak or numb.  Feel faint.  Have very bad pain in your: ? Chest. ? Belly (abdomen).  Throw up more than once.  Have trouble breathing. Summary  Hypertension is another name for high blood pressure.  High blood pressure forces your heart to work harder to pump blood.  For most people, a normal blood pressure is less than 120/80.  Making healthy choices can help lower blood pressure. If your blood pressure does not get lower with healthy choices, you may need to take medicine. This information is not intended to replace advice given to you by your health care provider. Make sure you discuss any questions you have with your health care provider. Document Revised: 03/23/2018 Document Reviewed: 03/23/2018 Elsevier Patient Education  2021 [employer].

## 2020-12-10 NOTE — Patient Outreach (Signed)
[facility]) Care Management  [place]  12/10/2020   Destiny Day 11-09-1950 [id number]  Subjective: Successful telephone outreach call to patient. HIPAA identifiers obtained. Patient states she is doing well. Patient reports that she did not receive the child size B/P monitor this nurse attempted to send her previously; nurse will send the B/P monitor if available. Patient explains that her dialysis is going well. Her weight and B/P is monitored 3 times weekly at dialysis and she does receive a printout of the values. Nurse encouraged patient to bring the printouts to her doctor appointments for them to review. Patient explained that her weight has been stable and she does acknowledge that her B/P has wide ranges and will elevate as high as systolic 626'R if she has been walking and states the value does go down after she rests. Patient has an appointment with Dr. Einar Gip on 01/09/21 and nurse encouraged her to discuss any B/P or CHF issues at that time. Patient did not have any further questions or concerns today and did confirm that she has this nurse's contact number to call her if needed.   Encounter Medications:  Outpatient Encounter Medications as of 12/10/2020  Medication Sig Note  . albuterol (PROAIR HFA) 108 (90 Base) MCG/ACT inhaler 4 puffs every 4-6 hours as needed (Patient taking differently: Inhale 4 puffs into the lungs every 4 (four) hours as needed for wheezing or shortness of breath. 4 puffs every 4-6 hours as needed)   . amLODipine (NORVASC) 10 MG tablet Take 10 mg by mouth at bedtime.   Marland Kitchen aspirin EC 81 MG tablet Take 81 mg by mouth daily.   Marland Kitchen atorvastatin (LIPITOR) 40 MG tablet Take 1 tablet (40 mg total) by mouth daily.   Marland Kitchen azelastine (ASTELIN) 0.1 % nasal spray Place 1 spray into both nostrils 2 (two) times daily.   Marland Kitchen BIDIL 20-37.5 MG tablet TAKE 1 TABLET BY MOUTH THREE TIMES A DAY   . budesonide-formoterol (SYMBICORT) 160-4.5 MCG/ACT inhaler 2  puffs twice daily with spacer (Patient taking differently: Inhale into the lungs in the morning and at bedtime. 2 puffs twice daily with spacer)   . busPIRone (BUSPAR) 7.5 MG tablet Take 7.5 mg by mouth 2 (two) times daily.   . carvedilol (COREG) 12.5 MG tablet Take 12.5 mg by mouth See admin instructions. Take 1 tablet (12.5 mg) by mouth once daily in the evening on Mondays, Wednesdays, & Fridays. (Dialysis) Take 1 tablet (12.5 mg) by mouth twice daily on Sundays, Tuesdays, Thursdays, & Saturdays. (Non Dialysis)   . cinacalcet (SENSIPAR) 60 MG tablet Take 90 mg by mouth See admin instructions. Take 1 tablet (90 mg) by mouth in the evening on Mondays, Wednesdays, & Fridays (dialysis) Take 1 tablet (90 mg) by mouth twice daily on Sundays, Tuesdays, Thursdays, Saturdays. (non dialysis)   . docusate sodium (COLACE) 100 MG capsule Take 1 capsule (100 mg total) by mouth 2 (two) times daily.   Marland Kitchen EPINEPHrine 0.3 mg/0.3 mL IJ SOAJ injection Inject 0.3 mLs (0.3 mg total) into the muscle as needed for anaphylaxis.   . ferric citrate (AURYXIA) 1 GM 210 MG(Fe) tablet Take 210-420 mg by mouth See admin instructions. Take 2 tablets (420 mg) by mouth with each meal & take 1 tablet (210 mg) by mouth with snacks.   . fluticasone (FLONASE) 50 MCG/ACT nasal spray Place 2 sprays into both nostrils daily as needed for allergies or rhinitis.   Marland Kitchen gabapentin (NEURONTIN) 100 MG capsule TAKE  1 CAPSULE (100 MG TOTAL) BY MOUTH AT BEDTIME. WHEN NECESSARY FOR NEUROPATHY PAIN (Patient taking differently: Take 100 mg by mouth at bedtime as needed (pain). When necessary for neuropathy pain)   . glipiZIDE (GLUCOTROL) 5 MG tablet Take 5 mg by mouth daily before breakfast.   . lidocaine-prilocaine (EMLA) cream Apply 1 application topically 3 (three) times a week. 1-2 hours prior to dialysis   . loperamide (IMODIUM) 2 MG capsule Take 2 mg by mouth every 4 (four) hours as needed (diarrhea).   . montelukast (SINGULAIR) 10 MG tablet TAKE 1  TABLET BY MOUTH EVERYDAY AT BEDTIME   . multivitamin (RENA-VIT) TABS tablet Take 1 tablet by mouth daily.    Marland Kitchen omeprazole (PRILOSEC) 20 MG capsule Take 20 mg by mouth daily.    . ondansetron (ZOFRAN) 4 MG tablet Take 1 tablet (4 mg total) by mouth every 6 (six) hours as needed for nausea.   Marland Kitchen oxyCODONE-acetaminophen (PERCOCET) 10-325 MG tablet Take 1 tablet by mouth every 6 (six) hours as needed for pain.   . polyethylene glycol (MIRALAX / GLYCOLAX) 17 g packet Take 17 g by mouth daily.   . traZODone (DESYREL) 50 MG tablet Take 50 mg by mouth at bedtime as needed for sleep.   . Vitamin D, Ergocalciferol, (DRISDOL) 1.25 MG (50000 UT) CAPS capsule Take 50,000 Units by mouth every Sunday. Takes on Sunday 08/30/2020: Needs Refilled    No facility-administered encounter medications on file as of 12/10/2020.    Functional Status:  In your present state of health, do you have any difficulty performing the following activities: 09/05/2020  Hearing? N  Vision? N  Difficulty concentrating or making decisions? N  Walking or climbing stairs? Y  Dressing or bathing? Y  Some recent data might be hidden    Fall/Depression Screening: Fall Risk  12/10/2020 07/11/2020 05/30/2020  Falls in the past year? 0 0 0  Number falls in past yr: 0 0 0  Injury with Fall? 0 0 0  Risk for fall due to : Impaired mobility;Impaired balance/gait Impaired balance/gait;Impaired mobility Impaired mobility;Impaired balance/gait  Follow up Falls prevention discussed;Education provided;Falls evaluation completed Falls evaluation completed;Falls prevention discussed;Education provided Falls prevention discussed;Education provided;Falls evaluation completed   PHQ 2/9 Scores 05/30/2020 03/28/2020 11/15/2019 08/15/2019 07/18/2019 06/20/2019 06/06/2019  PHQ - 2 Score 0 0 0 0 0 0 0    Assessment:  Goals Addressed            This Visit's Progress   . ([facility]) Track and Manage Fluids and Swelling       Timeframe:  Long-Range  Goal Priority:  High Start Date:  05/30/20                          Expected End Date: 06/25/21                      Follow Up Date 03/25/21   - call office if I gain more than 2 pounds in one day or 5 pounds in one week - keep legs up while sitting - track weight in diary - use salt in moderation - watch for swelling in feet, ankles and legs every day  -Encouraged patient to eat a low sodium diet -Discussed bringing the dialysis printout with her weight and B/P values to her doctor's appointments for review   Why is this important?   It is important to check your weight daily and watch how much  salt and liquids you have.  It will help you to manage your heart failure.    Notes: Patient received [facility] CHF packet that has CHF zones and actions plans. 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 Dialysis does provide her with a printout of her recorded weights and B/P  Updated 12/10/20: Patient continues to be weighed and have her B/P taken 3 times weekly during dialysis. She does receive a printout of her values.  Patient reports that she did not receive the child B/P monitor nurse attempted to send her previously and requested that this nurse send some hand sanitizer and masks if available.     Marland Kitchen [facility]) Track and Manage My Blood Pressure-Hypertension       Timeframe:  Long-Range Goal Priority:  High Start Date: 12/10/20                            Expected End Date: 12/23/20                       Follow Up Date 03/25/21    - check blood pressure daily - write blood pressure results in a log or diary  -Encouraged patient to eat a diet low in sodium -Discussed bringing dialysis printout with her B/P values and weight to her doctor appointments for them to review   Why is this important?    You won't feel high blood pressure, but it can still hurt your blood vessels.   High blood pressure can cause heart or  kidney problems. It can also cause a stroke.   Making lifestyle changes like losing a little weight or eating less salt will help.   Checking your blood pressure at home and at different times of the day can help to control blood pressure.   If the doctor prescribes medicine remember to take it the way the doctor ordered.   Call the office if you cannot afford the medicine or if there are questions about it.     Notes: 12/10/20: Patient reports that she did not get the child size B/P monitor that this nurse attempted to send her previously. Nurse discussed high value of 209/85 the patient had on 10/24/20 at her doctor's appointment. Patient explained that her B/P has wide ranges and it will elevate very high if she has been walking but then will go back down after she rests. Patient reports she has an upcoming appointment with Dr. Einar Gip on 01/09/21. Nurse encouraged her to discuss any B/P  issues with her doctor and to bring her printouts of her weight and B/P to her doctor appointments.     Marland Kitchen [facility]) Track and Manage Symptoms       Timeframe:  Long-Range Goal Priority:  High Start Date: 05/30/20                            Expected End Date: 06/25/21                      Follow Up Date 03/25/21   - develop a rescue plan - follow rescue plan if symptoms flare-up - know when to call the doctor - track symptoms and what helps feel better or worse - dress right for the weather, hot or cold    Why is this  important?   You will be able to handle your symptoms better if you keep track of them.  Making some simple changes to your lifestyle will help.  Eating healthy is one thing you can do to take good care of yourself.    Notes: Patient received  B/P cuff but states it is too big and she has difficulty using the device. Nurse will check to see if [facility] has a smaller B/P cuff. Patient gets weighed and has her B/P taken 3 times weekly at dialysis where it is recorded and given to her on a printout.   Patient states she received CHF education with zones and action plans.  Updated: 07/11/20  Updated 12/10/20: Patient reports that she continues to be weighed and have her B/P taken 3 times weekly during dialysis. She does receive a printout of her values monthly and states that her weight values have been consistent. Patient has an upcoming cardiology appointment 01/09/21.    . Track and Manage Activity and Exertion       Timeframe:  Long-Range Goal Priority:  High Start Date:  05/30/20                           Expected End Date: 03/25/21                   Follow Up Date 03/25/21   - follow activity or exercise plan - make an activity or exercise plan    Why is this important?   Exercising is very important when managing your heart failure.  It will help your heart get stronger.    Notes: Patient wants to stay as active and independent as possible. She wants to  maintain her strength and mobility. Patient received exercise printouts and states that she runs many errands and has a lot of provider appointments on her off days of dialysis which has increased the amount she walks.   Updated 12/10/20: Patient reports continuation of being active due to going to dialysis, running errands, and going to many scheduled provider appointments on her off dialysis days. She reports tolerating this activity well.       Plan: RN Health Coach will send PCP today's assessment note, will send patient a child size B/P cuff, mask, and hand sanitizer if available, and will call patient within the month of August. Follow-up:  Patient agrees to Care Plan and Follow-up.  Emelia Loron RN, BSN [place] [phone] [email or web address]

## 2020-12-11 DIAGNOSIS — Z992 Dependence on renal dialysis: Secondary | ICD-10-CM | POA: Diagnosis not present

## 2020-12-11 DIAGNOSIS — D509 Iron deficiency anemia, unspecified: Secondary | ICD-10-CM | POA: Diagnosis not present

## 2020-12-11 DIAGNOSIS — E876 Hypokalemia: Secondary | ICD-10-CM | POA: Diagnosis not present

## 2020-12-11 DIAGNOSIS — N2581 Secondary hyperparathyroidism of renal origin: Secondary | ICD-10-CM | POA: Diagnosis not present

## 2020-12-11 DIAGNOSIS — D631 Anemia in chronic kidney disease: Secondary | ICD-10-CM | POA: Diagnosis not present

## 2020-12-11 DIAGNOSIS — N186 End stage renal disease: Secondary | ICD-10-CM | POA: Diagnosis not present

## 2020-12-13 DIAGNOSIS — N2581 Secondary hyperparathyroidism of renal origin: Secondary | ICD-10-CM | POA: Diagnosis not present

## 2020-12-13 DIAGNOSIS — D631 Anemia in chronic kidney disease: Secondary | ICD-10-CM | POA: Diagnosis not present

## 2020-12-13 DIAGNOSIS — Z992 Dependence on renal dialysis: Secondary | ICD-10-CM | POA: Diagnosis not present

## 2020-12-13 DIAGNOSIS — D509 Iron deficiency anemia, unspecified: Secondary | ICD-10-CM | POA: Diagnosis not present

## 2020-12-13 DIAGNOSIS — E876 Hypokalemia: Secondary | ICD-10-CM | POA: Diagnosis not present

## 2020-12-13 DIAGNOSIS — N186 End stage renal disease: Secondary | ICD-10-CM | POA: Diagnosis not present

## 2020-12-16 DIAGNOSIS — D631 Anemia in chronic kidney disease: Secondary | ICD-10-CM | POA: Diagnosis not present

## 2020-12-16 DIAGNOSIS — N2581 Secondary hyperparathyroidism of renal origin: Secondary | ICD-10-CM | POA: Diagnosis not present

## 2020-12-16 DIAGNOSIS — E876 Hypokalemia: Secondary | ICD-10-CM | POA: Diagnosis not present

## 2020-12-16 DIAGNOSIS — D509 Iron deficiency anemia, unspecified: Secondary | ICD-10-CM | POA: Diagnosis not present

## 2020-12-16 DIAGNOSIS — Z992 Dependence on renal dialysis: Secondary | ICD-10-CM | POA: Diagnosis not present

## 2020-12-16 DIAGNOSIS — N186 End stage renal disease: Secondary | ICD-10-CM | POA: Diagnosis not present

## 2020-12-17 ENCOUNTER — Ambulatory Visit (INDEPENDENT_AMBULATORY_CARE_PROVIDER_SITE_OTHER): Payer: Medicare Other | Admitting: *Deleted

## 2020-12-17 DIAGNOSIS — J309 Allergic rhinitis, unspecified: Secondary | ICD-10-CM

## 2020-12-18 DIAGNOSIS — Z992 Dependence on renal dialysis: Secondary | ICD-10-CM | POA: Diagnosis not present

## 2020-12-18 DIAGNOSIS — E876 Hypokalemia: Secondary | ICD-10-CM | POA: Diagnosis not present

## 2020-12-18 DIAGNOSIS — D631 Anemia in chronic kidney disease: Secondary | ICD-10-CM | POA: Diagnosis not present

## 2020-12-18 DIAGNOSIS — N2581 Secondary hyperparathyroidism of renal origin: Secondary | ICD-10-CM | POA: Diagnosis not present

## 2020-12-18 DIAGNOSIS — N186 End stage renal disease: Secondary | ICD-10-CM | POA: Diagnosis not present

## 2020-12-18 DIAGNOSIS — D509 Iron deficiency anemia, unspecified: Secondary | ICD-10-CM | POA: Diagnosis not present

## 2020-12-20 DIAGNOSIS — D509 Iron deficiency anemia, unspecified: Secondary | ICD-10-CM | POA: Diagnosis not present

## 2020-12-20 DIAGNOSIS — D631 Anemia in chronic kidney disease: Secondary | ICD-10-CM | POA: Diagnosis not present

## 2020-12-20 DIAGNOSIS — N186 End stage renal disease: Secondary | ICD-10-CM | POA: Diagnosis not present

## 2020-12-20 DIAGNOSIS — Z992 Dependence on renal dialysis: Secondary | ICD-10-CM | POA: Diagnosis not present

## 2020-12-20 DIAGNOSIS — E876 Hypokalemia: Secondary | ICD-10-CM | POA: Diagnosis not present

## 2020-12-20 DIAGNOSIS — N2581 Secondary hyperparathyroidism of renal origin: Secondary | ICD-10-CM | POA: Diagnosis not present

## 2020-12-23 DIAGNOSIS — N186 End stage renal disease: Secondary | ICD-10-CM | POA: Diagnosis not present

## 2020-12-23 DIAGNOSIS — E876 Hypokalemia: Secondary | ICD-10-CM | POA: Diagnosis not present

## 2020-12-23 DIAGNOSIS — Z992 Dependence on renal dialysis: Secondary | ICD-10-CM | POA: Diagnosis not present

## 2020-12-23 DIAGNOSIS — D631 Anemia in chronic kidney disease: Secondary | ICD-10-CM | POA: Diagnosis not present

## 2020-12-23 DIAGNOSIS — N2581 Secondary hyperparathyroidism of renal origin: Secondary | ICD-10-CM | POA: Diagnosis not present

## 2020-12-23 DIAGNOSIS — D509 Iron deficiency anemia, unspecified: Secondary | ICD-10-CM | POA: Diagnosis not present

## 2020-12-24 ENCOUNTER — Other Ambulatory Visit: Payer: Self-pay

## 2020-12-24 ENCOUNTER — Ambulatory Visit (INDEPENDENT_AMBULATORY_CARE_PROVIDER_SITE_OTHER): Payer: Medicare Other | Admitting: Allergy & Immunology

## 2020-12-24 ENCOUNTER — Encounter: Payer: Self-pay | Admitting: Allergy & Immunology

## 2020-12-24 ENCOUNTER — Ambulatory Visit: Payer: Self-pay | Admitting: *Deleted

## 2020-12-24 VITALS — BP 140/82 | HR 100 | Temp 97.6°F | Resp 16 | Ht 65.0 in | Wt 140.0 lb

## 2020-12-24 DIAGNOSIS — J309 Allergic rhinitis, unspecified: Secondary | ICD-10-CM | POA: Diagnosis not present

## 2020-12-24 DIAGNOSIS — J302 Other seasonal allergic rhinitis: Secondary | ICD-10-CM | POA: Diagnosis not present

## 2020-12-24 DIAGNOSIS — J301 Allergic rhinitis due to pollen: Secondary | ICD-10-CM

## 2020-12-24 DIAGNOSIS — J3089 Other allergic rhinitis: Secondary | ICD-10-CM

## 2020-12-24 DIAGNOSIS — J4540 Moderate persistent asthma, uncomplicated: Secondary | ICD-10-CM

## 2020-12-24 MED ORDER — EPINEPHRINE 0.3 MG/0.3ML IJ SOAJ
0.3000 mg | INTRAMUSCULAR | 1 refills | Status: AC | PRN
Start: 2020-12-24 — End: ?

## 2020-12-24 MED ORDER — MONTELUKAST SODIUM 10 MG PO TABS: ORAL_TABLET | ORAL | 5 refills | Status: DC

## 2020-12-24 MED ORDER — ADVAIR HFA 115-21 MCG/ACT IN AERO: INHALATION_SPRAY | RESPIRATORY_TRACT | 5 refills | Status: AC

## 2020-12-24 MED ORDER — ALBUTEROL SULFATE HFA 108 (90 BASE) MCG/ACT IN AERS: INHALATION_SPRAY | RESPIRATORY_TRACT | 1 refills | Status: DC

## 2020-12-24 MED ORDER — EPINEPHRINE 0.3 MG/0.3ML IJ SOAJ: 0.3000 mg | INTRAMUSCULAR | 1 refills | Status: DC | PRN

## 2020-12-24 NOTE — Progress Notes (Signed)
FOLLOW UP  Date of Service/Encounter:  12/24/20   Assessment:   Moderate persistent asthma, uncomplicated   Chronic rhinitis(dust mites,cat,dog, grass, and indoor and outdoor molds) - on allergen immunotherapy with maintenance reached in November 2021  Elevated eosinophils - likely uncontrolled allergies  Complicated past medical history, including kidney disease on dialysis  Plan/Recommendations:   1. Moderate persistent asthma, uncomplicated - Lung testing looked stable. - We are going to change you to Advair 115/21 two puffs twice daily (instead of Symbicort), as this is listed as "Tier 5" (the others are listed as "not on formulary).  - Daily controller medication(s): Singulair 10mg  daily only + Advair 115/78mcg two puffs twice daily - Prior to physical activity: albuterol 2 puffs 10-15 minutes before physical activity. - Rescue medications: albuterol 4 puffs every 4-6 hours as needed - Asthma control goals:  * Full participation in all desired activities (may need albuterol before activity) * Albuterol use two time or less a week on average (not counting use with activity) * Cough interfering with sleep two time or less a month * Oral steroids no more than once a year * No hospitalizations  2. Chronic rhinitis (dust mites, cat, dog, grass, and indoor and outdoor molds) - Contonue with allergy shots at the same schedule.  - Continue with Xyzal 5mg  daily. - Continue with Singulair 10mg  daily. - Continue with Flonase one spray per nostril twice daily and Astelin one spray per nostril twice daily.   3. Follow up in six months or earlier if needed.     Subjective:   Destiny Day is a 70 y.o. female presenting today for follow up of  Chief Complaint  Patient presents with  . Asthma  . Cough    Contestant cough - seen PCP, and pulmonologist, takes her medication, and gets her shot.   . Allergic Rhinitis     Runny nose, itchy nose    Destiny Day  has a history of the following: Patient Active Problem List   Diagnosis Date Noted  . Macular pucker, right eye 10/29/2020  . Nausea with vomiting, unspecified 09/10/2020  . Vitreomacular traction syndrome, 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  . Pseudophakia of left eye 07/02/2020  . Diabetic macular edema of right eye with proliferative retinopathy associated with type 2 diabetes mellitus ([place]) 07/02/2020  . 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 type 2 diabetes mellitus ([place]) 04/23/2020  . Nuclear sclerotic cataract of right eye 04/23/2020  . Allergy, unspecified, initial encounter 04/04/2020  . Anaphylactic shock, unspecified, initial encounter 04/04/2020  . Secondary hypertension, unspecified 12/13/2019  . Other disorders of phosphorus metabolism 08/24/2019  . Paroxysmal atrial fibrillation ([place]) 06/13/2019  . Hypocalcemia 04/17/2019  . Old bucket handle tear of lateral meniscus of right knee   . Old peripheral tear of medial meniscus of right knee   . Staphylococcal arthritis, unspecified joint ([place]) 05/11/2018  . Septic arthritis of knee, right ([place]) 05/06/2018  . Degenerative tear of posterior horn of medial meniscus, right   . Old complex tear of lateral meniscus of right knee   . Loose body in knee, right   . 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[place]) 02/03/2018  . End-stage renal disease on hemodialysis ([place])   . Respiratory failure ([place]) 01/21/2018  . Achilles tendon contracture, right 11/09/2017  . Labile blood glucose   . Anemia of infection and chronic disease   . Black stool   . Right sided weakness   .  Subdural hemorrhage following injury ([place]) 08/30/2017  . Diabetic peripheral neuropathy ([place])   . Chronic obstructive pulmonary disease ([place])   . Subdural hematoma ([place]) 08/24/2017  . Traumatic subdural hematoma ([place]) 08/23/2017  . Fall   . SDH (subdural hematoma) ([place])   . Acute respiratory failure with hypoxia ([place])   .  Diabetes mellitus type 2 in nonobese (HCC)   . Leukocytosis   . Labile blood pressure   . Generalized anxiety disorder   . Debility 08/16/2017  . Amputated great toe of right foot ([place])   . Amputated toe of left foot ([place])   . Post-operative pain   . ESRD on dialysis ([place])   . Uncontrolled diabetes mellitus with right eye affected by proliferative retinopathy and traction retinal detachment involving macula, with long-term current use of insulin ([place])   . Anxiety state   . Acute blood loss anemia   . Diarrhea, unspecified 01/16/2017  . Dependence on renal dialysis ([place]) 01/06/2017  . Idiopathic chronic venous hypertension of both lower extremities with inflammation 10/13/2016  . S/P transmetatarsal amputation of foot, left ([place]) 09/21/2016  . Onychomycosis 08/15/2016  . Unspecified protein-calorie malnutrition ([place]) 07/29/2016  . Other mechanical complication of femoral arterial graft (bypass), initial encounter ([place]) 07/14/2016  . Other pneumonia, unspecified organism 07/11/2016  . Age-related osteoporosis without current pathological fracture 07/11/2016  . Cervical disc disorder, unspecified, unspecified cervical region 07/11/2016  . Diverticulitis of intestine, part unspecified, without perforation or abscess without bleeding 07/11/2016  . Unspecified abdominal hernia without obstruction or gangrene 07/11/2016  . Gastro-esophageal reflux disease with esophagitis 07/11/2016  . Primary generalized (osteo)arthritis 07/11/2016  . Peripheral vascular disease ([place]) 07/08/2016  . Cellulitis of left toe 06/29/2016  . Atherosclerosis of native artery of left lower extremity with gangrene ([place]) 06/23/2016  . Complication of vascular dialysis catheter 02/29/2016  . Encounter for screening for other viral diseases 01/10/2016  . Encounter for removal of sutures 10/29/2015  . Other fluid overload 10/23/2015  . Anemia in chronic kidney disease 10/16/2015  . Essential (primary) hypertension  10/16/2015  . Acute pulmonary edema ([place]) 10/16/2015  . Coagulation defect, unspecified ([place]) 10/16/2015  . Acute respiratory failure ([place]) 10/16/2015  . Hyperlipidemia, unspecified 10/16/2015  . Iron deficiency anemia, unspecified 10/16/2015  . Pain, unspecified 10/16/2015  . Pruritus, unspecified 10/16/2015  . Secondary hyperparathyroidism of renal origin ([place]) 10/16/2015  . Shortness of breath 10/16/2015  . GERD (gastroesophageal reflux disease) 10/07/2015  . ARF (acute renal failure) ([place])   . Hypothermia 06/12/2015  . End stage renal disease ([place]) 06/12/2015  . Unspecified diastolic (congestive) heart failure ([place]) 07/30/2014  . CKD (chronic kidney disease) stage 4, GFR 15-29 ml/min (HCC) 06/27/2014  . Hypertensive heart disease 05/25/2013  . CKD (chronic kidney disease) stage 3, GFR 30-59 ml/min (HCC) 05/25/2013  . Pulmonary edema 05/23/2013  . Type 2 diabetes mellitus with diabetic chronic kidney disease ([place]) 05/23/2013  . Type 2 diabetes mellitus with hyperosmolar nonketotic hyperglycemia ([place]) 04/13/2013  . Acute on chronic diastolic heart failure ([place]) 04/13/2013  . Gastrointestinal hemorrhage, unspecified 03/31/2013  . Hypokalemia 08/24/2012  . Hyperlipidemia associated with type 2 diabetes mellitus ([place]) 12/27/2007  . Allergic rhinitis due to pollen 12/27/2007  . Unspecified asthma, uncomplicated 27/09/5007    History obtained from: chart review and patient.   Destiny Day is a 70 y.o. female presenting for a follow up visit.  She was last seen in September 2021.  At that time, her lung testing looks stable.  We continue with  the Singulair 10 mg daily as well as Symbicort 2 puffs in the morning and 2 puffs at night.  We also continue with albuterol as needed.  For her rhinitis, we continue with allergy shots in combination with Xyzal, Singulair, and Flonase with Astelin.  Since the last visit, she has done well. She reports that he has been coughing "every since [she] was  here last year". Albuterol does not seem to help at all.   Asthma/Respiratory Symptom History: She is out of the Symbicort and the albuterol. She does feel like her cough is better when she was on the Symbicort.  She would just was not able to afford it.  She never shared this with [place] because she was embarrassed.  She has not needed prednisone, but her cough is returned with a vengeance.  It is bothering her husband of nearly 40 years.  Allergic Rhinitis Symptom History: Her allergy symptoms are certainly better on the allergy shots.  She remains on her Xyzal as well as her Singulair and no sprays.  She has not had any issues with this and has not required any prednisone.  Shots are going well without any local allergic reaction.   Destiny Day is on allergen immunotherapy. She receives two injections. Immunotherapy script #1 contains grasses, cat and dog. She currently receives 0.54mL of the RED vial (1/100). Immunotherapy script #2 contains molds and dust mites. She currently receives 0.47mL of the RED vial (1/100). She started shots July of 2021 and reached maintenance in November 2021.   Otherwise, there have been no changes to her past medical history, surgical history, family history, or social history.    Review of Systems  Constitutional: Negative.  Negative for chills, fever, malaise/fatigue and weight loss.  HENT: Positive for congestion. Negative for ear discharge, ear pain and sinus pain.   Eyes: Negative for pain, discharge and redness.  Respiratory: Positive for cough. Negative for sputum production, shortness of breath and wheezing.   Cardiovascular: Negative.  Negative for chest pain and palpitations.  Gastrointestinal: Negative for abdominal pain, constipation, diarrhea, heartburn, nausea and vomiting.  Skin: Negative.  Negative for itching and rash.  Neurological: Negative for dizziness and headaches.  Endo/Heme/Allergies: Positive for environmental allergies. Does not  bruise/bleed easily.       Objective:   Blood pressure 140/82, pulse 100, temperature 97.6 F (36.4 C), resp. rate 16, height 5\' 5"  (1.651 m), weight 140 lb (63.5 kg), SpO2 96 %. Body mass index is 23.3 kg/m.   Physical Exam:  Physical Exam Constitutional:      Appearance: She is well-developed.     Comments: Very pleasant female.  Cooperative with the exam.  Singing happy birthday to me.  HENT:     Head: Normocephalic and atraumatic.     Right Ear: Tympanic membrane, ear canal and external ear normal.     Left Ear: Tympanic membrane, ear canal and external ear normal.     Nose: No nasal deformity, septal deviation, mucosal edema or rhinorrhea.     Right Turbinates: Enlarged, swollen and pale.     Left Turbinates: Enlarged, swollen and pale.     Right Sinus: No maxillary sinus tenderness or frontal sinus tenderness.     Left Sinus: No maxillary sinus tenderness or frontal sinus tenderness.     Mouth/Throat:     Mouth: Mucous membranes are not pale and not dry.     Pharynx: Uvula midline.  Eyes:     General: Lids are normal. Allergic shiner  present.        Right eye: No discharge.        Left eye: No discharge.     Conjunctiva/sclera: Conjunctivae normal.     Right eye: Right conjunctiva is not injected. No chemosis.    Left eye: Left conjunctiva is not injected. No chemosis.    Pupils: Pupils are equal, round, and reactive to light.  Cardiovascular:     Rate and Rhythm: Normal rate and regular rhythm.     Heart sounds: Normal heart sounds.  Pulmonary:     Effort: Pulmonary effort is normal. No tachypnea, accessory muscle usage or respiratory distress.     Breath sounds: Normal breath sounds. No wheezing, rhonchi or rales.     Comments: Moving air well in all lung fields.  No increased work of breathing. Chest:     Chest wall: No tenderness.  Lymphadenopathy:     Cervical: No cervical adenopathy.  Skin:    Coloration: Skin is not pale.     Findings: No abrasion,  erythema, petechiae or rash. Rash is not papular, urticarial or vesicular.  Neurological:     Mental Status: She is alert.  Psychiatric:        Behavior: Behavior is cooperative.      Diagnostic studies:    Spirometry: results abnormal (FEV1: 0.98/48%, FVC: 1.66/63%, FEV1/FVC: 59%).    Spirometry consistent with mixed obstructive and restrictive disease.  Overall, values are stable.  Allergy Studies: none      Salvatore Marvel, MD  [facility] [place]

## 2020-12-24 NOTE — Patient Instructions (Addendum)
1. Moderate persistent asthma, uncomplicated - Lung testing looked stable. - We are going to change you to Advair 115/21 two puffs twice daily (instead of Symbicort), as this is listed as "Tier 5" (the others are listed as "not on formulary).  - LET us KNOW if this is not affordable and we can try to make some changes.  - I think the cough might be asthma since it seemed to be better controlled when you are on the Symbicort. - Daily controller medication(s): Singulair 10mg  daily only + Advair 115/20mcg two puffs twice daily - Prior to physical activity: albuterol 2 puffs 10-15 minutes before physical activity. - Rescue medications: albuterol 4 puffs every 4-6 hours as needed - Asthma control goals:  * Full participation in all desired activities (may need albuterol before activity) * Albuterol use two time or less a week on average (not counting use with activity) * Cough interfering with sleep two time or less a month * Oral steroids no more than once a year * No hospitalizations  2. Chronic rhinitis (dust mites, cat, dog, grass, and indoor and outdoor molds) - Contonue with allergy shots at the same schedule.  - Continue with Xyzal 5mg  daily. - Continue with Singulair 10mg  daily. - Continue with Flonase one spray per nostril twice daily and Astelin one spray per nostril twice daily.   3. Return in about 6 months (around 06/25/2021).    Please inform us of any Emergency Department visits, hospitalizations, or changes in symptoms. Call us before going to the ED for breathing or allergy symptoms since we might be able to fit you in for a sick visit. Feel free to contact us anytime with any questions, problems, or concerns.  It was a pleasure to see you again today!  Websites that have reliable patient information: 1. American Academy of Asthma, Allergy, and Immunology: www.aaaai.org 2. Food Allergy Research and Education (FARE): foodallergy.org 3. Mothers of Asthmatics:  http://www.asthmacommunitynetwork.org 4. American College of Allergy, Asthma, and Immunology: www.acaai.org   COVID-19 Vaccine Information can be found at: ShippingScam.co.uk For questions related to vaccine distribution or appointments, please email vaccine@[email or web address] or call [phone].     "Like" [place] on Facebook and Instagram for our latest updates!        Make sure you are registered to vote! If you have moved or changed any of your contact information, you will need to get this updated before voting!  In some cases, you MAY be able to register to vote online: [email or web address]

## 2020-12-25 DIAGNOSIS — D509 Iron deficiency anemia, unspecified: Secondary | ICD-10-CM | POA: Diagnosis not present

## 2020-12-25 DIAGNOSIS — D631 Anemia in chronic kidney disease: Secondary | ICD-10-CM | POA: Diagnosis not present

## 2020-12-25 DIAGNOSIS — Z992 Dependence on renal dialysis: Secondary | ICD-10-CM | POA: Diagnosis not present

## 2020-12-25 DIAGNOSIS — I159 Secondary hypertension, unspecified: Secondary | ICD-10-CM | POA: Diagnosis not present

## 2020-12-25 DIAGNOSIS — E876 Hypokalemia: Secondary | ICD-10-CM | POA: Diagnosis not present

## 2020-12-25 DIAGNOSIS — N2581 Secondary hyperparathyroidism of renal origin: Secondary | ICD-10-CM | POA: Diagnosis not present

## 2020-12-25 DIAGNOSIS — N186 End stage renal disease: Secondary | ICD-10-CM | POA: Diagnosis not present

## 2020-12-27 DIAGNOSIS — D631 Anemia in chronic kidney disease: Secondary | ICD-10-CM | POA: Diagnosis not present

## 2020-12-27 DIAGNOSIS — N2581 Secondary hyperparathyroidism of renal origin: Secondary | ICD-10-CM | POA: Diagnosis not present

## 2020-12-27 DIAGNOSIS — N186 End stage renal disease: Secondary | ICD-10-CM | POA: Diagnosis not present

## 2020-12-27 DIAGNOSIS — Z992 Dependence on renal dialysis: Secondary | ICD-10-CM | POA: Diagnosis not present

## 2020-12-27 DIAGNOSIS — D509 Iron deficiency anemia, unspecified: Secondary | ICD-10-CM | POA: Diagnosis not present

## 2020-12-27 DIAGNOSIS — E876 Hypokalemia: Secondary | ICD-10-CM | POA: Diagnosis not present

## 2020-12-30 DIAGNOSIS — D509 Iron deficiency anemia, unspecified: Secondary | ICD-10-CM | POA: Diagnosis not present

## 2020-12-30 DIAGNOSIS — N186 End stage renal disease: Secondary | ICD-10-CM | POA: Diagnosis not present

## 2020-12-30 DIAGNOSIS — Z992 Dependence on renal dialysis: Secondary | ICD-10-CM | POA: Diagnosis not present

## 2020-12-30 DIAGNOSIS — D631 Anemia in chronic kidney disease: Secondary | ICD-10-CM | POA: Diagnosis not present

## 2020-12-30 DIAGNOSIS — E876 Hypokalemia: Secondary | ICD-10-CM | POA: Diagnosis not present

## 2020-12-30 DIAGNOSIS — N2581 Secondary hyperparathyroidism of renal origin: Secondary | ICD-10-CM | POA: Diagnosis not present

## 2020-12-31 ENCOUNTER — Ambulatory Visit (INDEPENDENT_AMBULATORY_CARE_PROVIDER_SITE_OTHER): Payer: Medicare Other | Admitting: *Deleted

## 2020-12-31 ENCOUNTER — Telehealth: Payer: Self-pay

## 2020-12-31 DIAGNOSIS — J309 Allergic rhinitis, unspecified: Secondary | ICD-10-CM

## 2020-12-31 MED ORDER — EPINEPHRINE 0.3 MG/0.3ML IJ SOAJ: 0.3000 mg | Freq: Once | INTRAMUSCULAR | 1 refills | Status: AC

## 2020-12-31 NOTE — Telephone Encounter (Signed)
Patient came by today and she states the Auvi-q wasn't covered by her insurance. Patient is wondering can we send in a generic epi pen that may be covered. Patient also states her Advair is over $300 and this is not affordable for her. Patient is wondering what else could be sent in?   Please Advise

## 2020-12-31 NOTE — Telephone Encounter (Signed)
I will print out the paper work and start on it and inform pt as there is two pages she has to complete and then will have you sign when I am in [place] next

## 2020-12-31 NOTE — Telephone Encounter (Signed)
Can we start [employer] application for Symbicort 160/4.72mcg two puffs BID?   Salvatore Marvel, MD [facility] [place]

## 2020-12-31 NOTE — Telephone Encounter (Signed)
I have sent in the generic epipen for pt. Pts formulary states everything is a tier 3 or 5 which means its going to be costly. But symbicort, flovent, spiriva, breo and trelegy are all covered at a tier 3 like advair.

## 2021-01-01 DIAGNOSIS — E876 Hypokalemia: Secondary | ICD-10-CM | POA: Diagnosis not present

## 2021-01-01 DIAGNOSIS — Z992 Dependence on renal dialysis: Secondary | ICD-10-CM | POA: Diagnosis not present

## 2021-01-01 DIAGNOSIS — D509 Iron deficiency anemia, unspecified: Secondary | ICD-10-CM | POA: Diagnosis not present

## 2021-01-01 DIAGNOSIS — N186 End stage renal disease: Secondary | ICD-10-CM | POA: Diagnosis not present

## 2021-01-01 DIAGNOSIS — D631 Anemia in chronic kidney disease: Secondary | ICD-10-CM | POA: Diagnosis not present

## 2021-01-01 DIAGNOSIS — N2581 Secondary hyperparathyroidism of renal origin: Secondary | ICD-10-CM | POA: Diagnosis not present

## 2021-01-02 ENCOUNTER — Ambulatory Visit (INDEPENDENT_AMBULATORY_CARE_PROVIDER_SITE_OTHER): Payer: Medicare Other | Admitting: Ophthalmology

## 2021-01-02 ENCOUNTER — Other Ambulatory Visit: Payer: Self-pay

## 2021-01-02 ENCOUNTER — Encounter (INDEPENDENT_AMBULATORY_CARE_PROVIDER_SITE_OTHER): Payer: Self-pay | Admitting: Ophthalmology

## 2021-01-02 DIAGNOSIS — E113512 Type 2 diabetes mellitus with proliferative diabetic retinopathy with macular edema, left eye: Secondary | ICD-10-CM | POA: Diagnosis not present

## 2021-01-02 MED ORDER — BEVACIZUMAB 2.5 MG/0.1ML IZ SOSY
2.5000 mg | PREFILLED_SYRINGE | INTRAVITREAL | Status: AC | PRN
  Administered 2021-01-02: 2.5 mg via INTRAVITREAL

## 2021-01-02 NOTE — Progress Notes (Signed)
01/02/2021     CHIEF COMPLAINT Patient presents for Retina Follow Up (5 Wk F/U OS, poss Avastin OS - pt driving herself today and unable to dilate OU//Pt denies noticeable changes to [place] OU since last visit. Pt denies ocular pain, flashes of light, or floaters OU. Pt sts floaters are gone./A1c: 6.1, 2 weeks ago)   HISTORY OF PRESENT ILLNESS: Destiny Day is a 70 y.o. female who presents to the clinic today for:   HPI     Retina Follow Up           Diagnosis: Diabetic Retinopathy   Laterality: left eye   Onset: 5 weeks ago   Severity: mild   Duration: 5 weeks   Course: gradually improving   Comments: 5 Wk F/U OS, poss Avastin OS - pt driving herself today and unable to dilate OU  Pt denies noticeable changes to [place] OU since last visit. Pt denies ocular pain, flashes of light, or floaters OU. Pt sts floaters are gone. A1c: 6.1, 2 weeks ago       Last edited by Rockie Neighbours, [place] on 01/02/2021  8:54 AM.      Referring physician: Cipriano Mile, [place] [address]  HISTORICAL INFORMATION:   Selected notes from the MEDICAL RECORD NUMBER    Lab Results  Component Value Date   HGBA1C 5.6 06/26/2019     CURRENT MEDICATIONS: No current outpatient medications on file. (Ophthalmic Drugs)   No current facility-administered medications for this visit. (Ophthalmic Drugs)   Current Outpatient Medications (Other)  Medication Sig   albuterol (PROAIR HFA) 108 (90 Base) MCG/ACT inhaler 4 puffs every 4-6 hours as needed   amLODipine (NORVASC) 10 MG tablet Take 10 mg by mouth at bedtime.   aspirin EC 81 MG tablet Take 81 mg by mouth daily.   atorvastatin (LIPITOR) 40 MG tablet Take 1 tablet (40 mg total) by mouth daily.   azelastine (ASTELIN) 0.1 % nasal spray Place 1 spray into both nostrils 2 (two) times daily.   BIDIL 20-37.5 MG tablet TAKE 1 TABLET BY MOUTH THREE TIMES A DAY   budesonide-formoterol (SYMBICORT) 160-4.5 MCG/ACT inhaler 2 puffs  twice daily with spacer (Patient taking differently: Inhale into the lungs in the morning and at bedtime. 2 puffs twice daily with spacer)   busPIRone (BUSPAR) 7.5 MG tablet Take 7.5 mg by mouth 2 (two) times daily.   carvedilol (COREG) 12.5 MG tablet Take 12.5 mg by mouth See admin instructions. Take 1 tablet (12.5 mg) by mouth once daily in the evening on Mondays, Wednesdays, & Fridays. (Dialysis) Take 1 tablet (12.5 mg) by mouth twice daily on Sundays, Tuesdays, Thursdays, & Saturdays. (Non Dialysis)   cinacalcet (SENSIPAR) 60 MG tablet Take 90 mg by mouth See admin instructions. Take 1 tablet (90 mg) by mouth in the evening on Mondays, Wednesdays, & Fridays (dialysis) Take 1 tablet (90 mg) by mouth twice daily on Sundays, Tuesdays, Thursdays, Saturdays. (non dialysis)   docusate sodium (COLACE) 100 MG capsule Take 1 capsule (100 mg total) by mouth 2 (two) times daily.   EPINEPHrine (AUVI-Q) 0.3 mg/0.3 mL IJ SOAJ injection Inject 0.3 mg into the muscle as needed for anaphylaxis.   EPINEPHrine 0.3 mg/0.3 mL IJ SOAJ injection Inject 0.3 mLs (0.3 mg total) into the muscle as needed for anaphylaxis.   ferric citrate (AURYXIA) 1 GM 210 MG(Fe) tablet Take 210-420 mg by mouth See admin instructions. Take 2 tablets (420 mg) by mouth  with each meal & take 1 tablet (210 mg) by mouth with snacks.   fluticasone (FLONASE) 50 MCG/ACT nasal spray Place 2 sprays into both nostrils daily as needed for allergies or rhinitis.   fluticasone-salmeterol (ADVAIR HFA) 115-21 MCG/ACT inhaler Inhale two puffs twice daily   gabapentin (NEURONTIN) 100 MG capsule TAKE 1 CAPSULE (100 MG TOTAL) BY MOUTH AT BEDTIME. WHEN NECESSARY FOR NEUROPATHY PAIN (Patient taking differently: Take 100 mg by mouth at bedtime as needed (pain). When necessary for neuropathy pain)   glipiZIDE (GLUCOTROL) 5 MG tablet Take 5 mg by mouth daily before breakfast.   lidocaine-prilocaine (EMLA) cream Apply 1 application topically 3 (three) times a week.  1-2 hours prior to dialysis   loperamide (IMODIUM) 2 MG capsule Take 2 mg by mouth every 4 (four) hours as needed (diarrhea).   montelukast (SINGULAIR) 10 MG tablet TAKE 1 TABLET BY MOUTH EVERYDAY AT BEDTIME   multivitamin (RENA-VIT) TABS tablet Take 1 tablet by mouth daily.    omeprazole (PRILOSEC) 20 MG capsule Take 20 mg by mouth daily.    ondansetron (ZOFRAN) 4 MG tablet Take 1 tablet (4 mg total) by mouth every 6 (six) hours as needed for nausea.   oxyCODONE-acetaminophen (PERCOCET) 10-325 MG tablet Take 1 tablet by mouth every 6 (six) hours as needed for pain.   polyethylene glycol (MIRALAX / GLYCOLAX) 17 g packet Take 17 g by mouth daily.   traZODone (DESYREL) 50 MG tablet Take 50 mg by mouth at bedtime as needed for sleep.   Vitamin D, Ergocalciferol, (DRISDOL) 1.25 MG (50000 UT) CAPS capsule Take 50,000 Units by mouth every Sunday. Takes on Sunday   No current facility-administered medications for this visit. (Other)      REVIEW OF SYSTEMS:    ALLERGIES Allergies  Allergen Reactions   Adhesive  [Tape] Hives   Lisinopril Cough   Prednisone Swelling and Other (See Comments)    Excessive fluid buildup    PAST MEDICAL HISTORY Past Medical History:  Diagnosis Date   Abdominal bruit    Anemia    Anxiety    Arthritis    Osteoarthritis   Asthma    Cervical disc disease    "pinced nerve"   CHF (congestive heart failure) (HCC)    Complication of anesthesia    " after I got home from my last procedure, I started itching."   Diabetes mellitus    Type II   Diverticulitis    ESRD (end stage renal disease) ([place])    dialysis - M/W/F- [place]   GERD (gastroesophageal reflux disease)    from medications   GI bleed 03/31/2013   Head injury 07/2017   History of hiatal hernia    Hyperlipidemia    Hypertension    Neuropathy    left leg   Osteoporosis    Peripheral vascular disease ([place])    Pneumonia    "very young" and a few years ago   PONV (postoperative nausea and  vomiting)    Seasonal allergies    Shortness of breath dyspnea    WIth exertion, when fluid builds   Sleep apnea    can't afford cpap    Past Surgical History:  Procedure Laterality Date   A/V SHUNTOGRAM N/A 09/22/2016   Procedure: A/V Shuntogram - left arm;  Surgeon: Serafina Mitchell, MD;  Location: [name];  Service: Cardiovascular;  Laterality: N/A;   A/V SHUNTOGRAM N/A 03/22/2018   Procedure: A/V SHUNTOGRAM - left arm;  Surgeon: Harold Barban  W, MD;  Location: [facility];  Service: Cardiovascular;  Laterality: N/A;   A/V SHUNTOGRAM Left 11/17/2018   Procedure: A/V SHUNTOGRAM;  Surgeon: Angelia Mould, MD;  Location: [facility];  Service: Cardiovascular;  Laterality: Left;   ABDOMINAL HYSTERECTOMY  1993`   AMPUTATION Left 09/01/2016   Procedure: LEFT FOOT TRANSMETATARSAL AMPUTATION;  Surgeon: Newt Minion, MD;  Location: [facility];  Service: Orthopedics;  Laterality: Left;   AMPUTATION Right 08/11/2017   Procedure: RIGHT GREAT TOE AMPUTATION DIGIT;  Surgeon: Rosetta Posner, MD;  Location: [facility];  Service: Vascular;  Laterality: Right;   AMPUTATION Right 12/31/2017   Procedure: RIGHT TRANSMETATARSAL AMPUTATION;  Surgeon: Newt Minion, MD;  Location: [facility];  Service: Orthopedics;  Laterality: Right;   AV FISTULA PLACEMENT Left 04/21/2016   Procedure: INSERTION OF ARTERIOVENOUS (AV) GORE-TEX GRAFT ARM LEFT;  Surgeon: Elam Dutch, MD;  Location: [facility];  Service: Vascular;  Laterality: Left;   BASCILIC VEIN TRANSPOSITION Left 07/10/2014   Procedure: BASCILIC VEIN TRANSPOSITION;  Surgeon: Angelia Mould, MD;  Location: [facility];  Service: Vascular;  Laterality: Left;   [place] Right 11/08/2014   Procedure: FIRST STAGE BASILIC VEIN TRANSPOSITION;  Surgeon: Angelia Mould, MD;  Location: [facility];  Service: Vascular;  Laterality: Right;   [place] Right 01/18/2015   Procedure: SECOND STAGE BASILIC VEIN TRANSPOSITION;   Surgeon: Angelia Mould, MD;  Location: [facility];  Service: Vascular;  Laterality: Right;   CRANIOTOMY N/A 08/23/2017   Procedure: CRANIOTOMY HEMATOMA EVACUATION SUBDURAL;  Surgeon: Ashok Pall, MD;  Location: [facility];  Service: Neurosurgery;  Laterality: N/A;   CRANIOTOMY Left 08/24/2017   Procedure: CRANIOTOMY FOR RECURRENT ACUTE SUBDURAL HEMATOMA;  Surgeon: Ashok Pall, MD;  Location: [facility];  Service: Neurosurgery;  Laterality: Left;   ESOPHAGOGASTRODUODENOSCOPY N/A 03/31/2013   Procedure: ESOPHAGOGASTRODUODENOSCOPY (EGD);  Surgeon: Gatha Mayer, MD;  Location[facility] ENDOSCOPY;  Service: Endoscopy;  Laterality: N/A;   EYE SURGERY     laser surgery   FEMORAL-POPLITEAL BYPASS GRAFT Left 07/08/2016   Procedure: LEFT  FEMORAL-BELOW KNEE POPLITEAL ARTERY BYPASS GRAFT USING 6MM X 80 CM PROPATEN GORETEX GRAFT WITH RINGS.;  Surgeon: Rosetta Posner, MD;  Location: [facility];  Service: Vascular;  Laterality: Left;   FEMORAL-POPLITEAL BYPASS GRAFT Right 08/11/2017   Procedure: RIGHT FEMORAL TO BELOW KNEE POPLITEAL ARTERKY  BYPASS GRAFT USING 6MM RINGED PROPATEN GRAFT;  Surgeon: Rosetta Posner, MD;  Location: [facility];  Service: Vascular;  Laterality: Right;   FISTULOGRAM Left 10/29/2014   Procedure: FISTULOGRAM;  Surgeon: Angelia Mould, MD;  Location: [facility] C[facility] LAB;  Service: Cardiovascular;  Laterality: Left;   GAS/FLUID EXCHANGE Left 12/20/2018   Procedure: AIR GAS EXCHANGE;  Surgeon: Jalene Mullet, MD;  Location: [facility];  Service: Ophthalmology;  Laterality: Left;   INSERTION OF DIALYSIS CATHETER N/A 09/05/2020   Procedure: INSERTION OF DIALYSIS CATHETER;  Surgeon: Cherre Robins, MD;  Location: [facility];  Service: Vascular;  Laterality: N/A;   KNEE ARTHROSCOPY Right 05/06/2018   Procedure: RIGHT KNEE ARTHROSCOPY;  Surgeon: Newt Minion, MD;  Location: [facility];  Service: Orthopedics;  Laterality: Right;   KNEE ARTHROSCOPY Right 06/10/2018   Procedure: RIGHT KNEE ARTHROSCOPY AND DEBRIDEMENT;  Surgeon: Newt Minion, MD;  Location: [facility];  Service: Orthopedics;  Laterality: Right;   LIGATION OF ARTERIOVENOUS  FISTULA Left 04/21/2016   Procedure: LIGATION OF ARTERIOVENOUS  FISTULA LEFT ARM;  Surgeon:  Elam Dutch, MD;  Location: [facility] OR;  Service: Vascular;  Laterality: Left;   LOWER EXTREMITY ANGIOGRAPHY N/A 04/06/2017   Procedure: Lower Extremity Angiography - Right;  Surgeon: Serafina Mitchell, MD;  Location: [facility];  Service: Cardiovascular;  Laterality: N/A;   MEMBRANE PEEL Left 12/20/2018   Procedure: MEMBRANE PEEL;  Surgeon: Jalene Mullet, MD;  Location: [facility];  Service: Ophthalmology;  Laterality: Left;   PATCH ANGIOPLASTY Right 01/18/2015   Procedure: BASILIC VEIN PATCH ANGIOPLASTY USING VASCUGUARD PATCH;  Surgeon: Angelia Mould, MD;  Location: [facility];  Service: Vascular;  Laterality: Right;   PERIPHERAL VASCULAR BALLOON ANGIOPLASTY  09/22/2016   Procedure: Peripheral Vascular Balloon Angioplasty;  Surgeon: Serafina Mitchell, MD;  Location: [facility];  Service: Cardiovascular;;  Lt. Fistula   PERIPHERAL VASCULAR BALLOON ANGIOPLASTY Left 03/22/2018   Procedure: PERIPHERAL VASCULAR BALLOON ANGIOPLASTY;  Surgeon: Serafina Mitchell, MD;  Location: [facility];  Service: Cardiovascular;  Laterality: Left;  Arm shunt   PERIPHERAL VASCULAR CATHETERIZATION N/A 06/23/2016   Procedure: Abdominal Aortogram w/Lower Extremity;  Surgeon: Serafina Mitchell, MD;  Location: [facility];  Service: Cardiovascular;  Laterality: N/A;   PERIPHERAL VASCULAR CATHETERIZATION  06/23/2016   Procedure: Peripheral Vascular Intervention;  Surgeon: Serafina Mitchell, MD;  Location: [facility];  Service: Cardiovascular;;  lt common and external illiac artery   PHOTOCOAGULATION WITH LASER Left 12/20/2018   Procedure: PHOTOCOAGULATION WITH LASER;  Surgeon: Jalene Mullet, MD;  Location: [facility] [place];  Service: Ophthalmology;  Laterality: Left;   REPAIR OF COMPLEX TRACTION RETINAL DETACHMENT  Left 12/20/2018   Procedure: REPAIR OF COMPLEX TRACTION RETINAL DETACHMENT;  Surgeon: Jalene Mullet, MD;  Location: [facility];  Service: Ophthalmology;  Laterality: Left;   REVISION OF ARTERIOVENOUS GORETEX GRAFT Left 09/05/2020   Procedure: REVISION OF ARTERIOVENOUS GORETEX GRAFT LEFT;  Surgeon: Cherre Robins, MD;  Location: [facility];  Service: Vascular;  Laterality: Left;    FAMILY HISTORY Family History  Problem Relation Age of Onset   Other Mother        not sure of cause of death   Diabetes Father    Pancreatic cancer Maternal Grandmother    Diabetes Sister    Diabetes Sister    Colon cancer Neg Hx     SOCIAL HISTORY Social History   Tobacco Use   Smoking status: Former    Packs/day: 0.35    Years: 40.00    Pack years: 14.00    Types: Cigarettes    Quit date: 05/10/2012    Years since quitting: 8.6   Smokeless tobacco: Never  Vaping Use   Vaping Use: Never used  Substance Use Topics   Alcohol use: No   Drug use: No    Comment: marijuana; quit in early 1980's         OPHTHALMIC EXAM:  Base Eye Exam     Visual Acuity (ETDRS)       Right Left   Dist cc 20/100 -1 20/60   Dist ph cc NI NI    Correction: Glasses         Tonometry (Tonopen, 8:58 AM)       Right Left   Pressure 15 20         Pupils       Pupils Dark Light Shape React APD   Right PERRL 3 3 Round Minimal None   Left PERRL 3 3 Round Minimal None  Visual Fields (Counting fingers)       Left Right    Full    Restrictions  Partial outer inferior nasal deficiency         Extraocular Movement       Right Left    Full Full         Neuro/Psych     Oriented x3: Yes   Mood/Affect: Normal         Dilation     Left eye: 1.0% Mydriacyl, 2.5% Phenylephrine @ 8:58 AM  Dilate OS only today due to pt driving herself          Slit Lamp and Fundus Exam     External Exam       Right Left   External Normal Normal         Slit Lamp Exam       Right Left    Lids/Lashes  Normal   Conjunctiva/Sclera  White and quiet   Cornea  Clear   Anterior Chamber  Deep and quiet   Iris  Round and reactive   Lens Centered posterior chamber intraocular lens Centered posterior chamber intraocular lens   Anterior Vitreous  Normal, remnant         Fundus Exam       Right Left   Posterior Vitreous  Vitrectomized, by history   Disc  No details through cataract   C/D Ratio  0.3   Macula  CSME at and temporal to the fovea, which suggests that there is still residual cortical vitreous remains   Vessels  Good PRP, active NVE temporally and inferiorly   Periphery   attached peripherally            IMAGING AND PROCEDURES  Imaging and Procedures for 01/02/21  OCT, Retina - OU - Both Eyes       Right Eye Scan locations included subfoveal. Central Foveal Thickness: 378. Progression has improved. Findings include abnormal foveal contour, vitreous traction, vitreomacular adhesion .   Left Eye Quality was good. Scan locations included subfoveal, extrafoveal. Central Foveal Thickness: 433. Progression has been stable. Findings include abnormal foveal contour, subretinal fluid.   Notes No signs of crunch syndrome OD after intravitreal injection Avastin, vitreomacular traction and adhesion remains yet less CSME,  Traction remains with vitreomacular elevation will need 1 day need vitrectomy membrane peel right eye of the vitreoretinal traction to maximize acuity and prevent detachment       Intravitreal Injection, Pharmacologic Agent - OS - Left Eye       Time Out 01/02/2021. 9:34 AM. Confirmed correct patient, procedure, site, and patient consented.   Anesthesia Topical anesthesia was used. Anesthetic medications included [place] 3.5%.   Procedure Preparation included 5% betadine to ocular surface, 10% betadine to eyelids, Tobramycin 0.3%. A 30 gauge needle was used.   Injection: 2.5 mg bevacizumab 2.5 MG/0.1ML   Route: Intravitreal   NDC:  26834-196-22   Post-op Post injection exam found visual acuity of at least counting fingers. The patient tolerated the procedure well. There were no complications. The patient received written and verbal post procedure care education. Post injection medications were not given.              ASSESSMENT/PLAN:  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 type 2 diabetes mellitus (HCC) Chronic CSME yet posterior PRP is required to calm the residual neovascular tissue temporal to fovea contributing to the recurrent and persistent CSME     ICD-10-CM  1. 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  Repeat intravitreal Avastin OS today to control CSME which appears to be emanating from a large region of neovascular tissue temporal portion of the macula.  Affected knee OD remains post vitrectomy, from prior surgeon elsewhere, suggest there is residual cortical vitreous remaining  2.  2 weeks PRP to the posterior retina particularly attention temporally  3.  Dilate OS, PRP posteriorly and peripherally temporally  Ophthalmic Meds Ordered this visit:  Meds ordered this encounter  Medications   bevacizumab (AVASTIN) SOSY 2.5 mg       Return in about 2 weeks (around 01/16/2021) for dilate, OS, PRP.  There are no Patient Instructions on file for this visit.   Explained the diagnoses, plan, and follow up with the patient and they expressed understanding.  Patient expressed understanding of the importance of proper follow up care.   Clent Demark Winson Eichorn M.D. Diseases & Surgery of the Retina and Vitreous Retina [facility] Di[facility] [place] 01/02/21     Abbreviations: M myopia (nearsighted); A astigmatism; H hyperopia (farsighted); P presbyopia;  Mrx spectacle prescription;  CTL contact lenses; OD right eye; OS left eye; OU both eyes  XT exotropia; ET esotropia; PEK punctate epithelial keratitis; PEE punctate epithelial erosions; DES dry eye syndrome; MGD meibomian gland dysfunction; ATs artificial tears; PFAT's preservative free artificial tears; [place] nuclear sclerotic cataract; PSC posterior subcapsular cataract; ERM epi-retinal membrane; PVD posterior vitreous detachment; RD retinal detachment; DM diabetes mellitus; DR diabetic retinopathy; NPDR non-proliferative diabetic retinopathy; PDR proliferative diabetic retinopathy; CSME clinically significant macular edema; DME diabetic macular edema; dbh dot blot hemorrhages; CWS cotton wool spot; POAG primary open angle glaucoma; C/D cup-to-disc ratio; HVF humphrey visual field; GVF goldmann visual field; OCT optical coherence tomography; IOP intraocular pressure; BRVO Branch retinal vein occlusion; CRVO central retinal vein occlusion; CRAO central retinal artery occlusion; BRAO branch retinal artery occlusion; RT retinal tear; SB scleral buckle; PPV pars plana vitrectomy; VH Vitreous hemorrhage; PRP panretinal laser photocoagulation; IVK intravitreal kenalog; VMT vitreomacular traction; MH Macular hole;  NVD neovascularization of the disc; NVE neovascularization elsewhere; AREDS age related eye disease study; ARMD age related macular degeneration; POAG primary open angle glaucoma; EBMD epithelial/anterior basement membrane dystrophy; ACIOL anterior chamber intraocular lens; IOL intraocular lens; PCIOL posterior chamber intraocular lens; Phaco/IOL phacoemulsification with intraocular lens placement; [place] photorefractive keratectomy; LASIK laser assisted in situ keratomileusis; HTN hypertension; DM diabetes mellitus; COPD chronic obstructive pulmonary disease

## 2021-01-02 NOTE — Assessment & Plan Note (Signed)
Chronic CSME yet posterior PRP is required to calm the residual neovascular tissue temporal to fovea contributing to the recurrent and persistent CSME

## 2021-01-03 DIAGNOSIS — N186 End stage renal disease: Secondary | ICD-10-CM | POA: Diagnosis not present

## 2021-01-03 DIAGNOSIS — Z992 Dependence on renal dialysis: Secondary | ICD-10-CM | POA: Diagnosis not present

## 2021-01-03 DIAGNOSIS — N2581 Secondary hyperparathyroidism of renal origin: Secondary | ICD-10-CM | POA: Diagnosis not present

## 2021-01-03 DIAGNOSIS — D631 Anemia in chronic kidney disease: Secondary | ICD-10-CM | POA: Diagnosis not present

## 2021-01-03 DIAGNOSIS — E876 Hypokalemia: Secondary | ICD-10-CM | POA: Diagnosis not present

## 2021-01-03 DIAGNOSIS — D509 Iron deficiency anemia, unspecified: Secondary | ICD-10-CM | POA: Diagnosis not present

## 2021-01-05 ENCOUNTER — Observation Stay (HOSPITAL_COMMUNITY)
Admission: EM | Admit: 2021-01-05 | Discharge: 2021-01-06 | Disposition: A | Discharge status: Home / Self Care | Payer: Medicare Other | Attending: Internal Medicine | Admitting: Internal Medicine

## 2021-01-05 ENCOUNTER — Encounter (HOSPITAL_COMMUNITY): Payer: Self-pay

## 2021-01-05 ENCOUNTER — Emergency Department (HOSPITAL_COMMUNITY): Payer: Medicare Other

## 2021-01-05 ENCOUNTER — Other Ambulatory Visit: Payer: Self-pay

## 2021-01-05 DIAGNOSIS — E1122 Type 2 diabetes mellitus with diabetic chronic kidney disease: Secondary | ICD-10-CM | POA: Insufficient documentation

## 2021-01-05 DIAGNOSIS — I5033 Acute on chronic diastolic (congestive) heart failure: Principal | ICD-10-CM | POA: Insufficient documentation

## 2021-01-05 DIAGNOSIS — J45909 Unspecified asthma, uncomplicated: Secondary | ICD-10-CM | POA: Insufficient documentation

## 2021-01-05 DIAGNOSIS — Z7982 Long term (current) use of aspirin: Secondary | ICD-10-CM | POA: Insufficient documentation

## 2021-01-05 DIAGNOSIS — Z20822 Contact with and (suspected) exposure to covid-19: Secondary | ICD-10-CM | POA: Insufficient documentation

## 2021-01-05 DIAGNOSIS — Z87891 Personal history of nicotine dependence: Secondary | ICD-10-CM | POA: Diagnosis not present

## 2021-01-05 DIAGNOSIS — Z79899 Other long term (current) drug therapy: Secondary | ICD-10-CM | POA: Diagnosis not present

## 2021-01-05 DIAGNOSIS — J449 Chronic obstructive pulmonary disease, unspecified: Secondary | ICD-10-CM | POA: Insufficient documentation

## 2021-01-05 DIAGNOSIS — I480 Paroxysmal atrial fibrillation: Secondary | ICD-10-CM | POA: Diagnosis not present

## 2021-01-05 DIAGNOSIS — Z794 Long term (current) use of insulin: Secondary | ICD-10-CM | POA: Diagnosis not present

## 2021-01-05 DIAGNOSIS — Z992 Dependence on renal dialysis: Secondary | ICD-10-CM | POA: Diagnosis not present

## 2021-01-05 DIAGNOSIS — R079 Chest pain, unspecified: Secondary | ICD-10-CM | POA: Diagnosis not present

## 2021-01-05 DIAGNOSIS — I120 Hypertensive chronic kidney disease with stage 5 chronic kidney disease or end stage renal disease: Secondary | ICD-10-CM | POA: Insufficient documentation

## 2021-01-05 DIAGNOSIS — Z7984 Long term (current) use of oral hypoglycemic drugs: Secondary | ICD-10-CM | POA: Diagnosis not present

## 2021-01-05 DIAGNOSIS — R0602 Shortness of breath: Secondary | ICD-10-CM

## 2021-01-05 DIAGNOSIS — J811 Chronic pulmonary edema: Secondary | ICD-10-CM | POA: Diagnosis not present

## 2021-01-05 DIAGNOSIS — N186 End stage renal disease: Secondary | ICD-10-CM | POA: Diagnosis not present

## 2021-01-05 DIAGNOSIS — J810 Acute pulmonary edema: Secondary | ICD-10-CM | POA: Diagnosis not present

## 2021-01-05 DIAGNOSIS — I509 Heart failure, unspecified: Secondary | ICD-10-CM

## 2021-01-05 DIAGNOSIS — I517 Cardiomegaly: Secondary | ICD-10-CM | POA: Diagnosis not present

## 2021-01-05 LAB — BASIC METABOLIC PANEL
Anion gap: 16 — ABNORMAL HIGH (ref 5–15)
BUN: 21 mg/dL (ref 8–23)
CO2: 27 mmol/L (ref 22–32)
Calcium: 8.3 mg/dL — ABNORMAL LOW (ref 8.9–10.3)
Chloride: 94 mmol/L — ABNORMAL LOW (ref 98–111)
Creatinine, Ser: 5.5 mg/dL — ABNORMAL HIGH (ref 0.44–1.00)
GFR, Estimated: 8 mL/min — ABNORMAL LOW (ref >60)
Glucose, Bld: 130 mg/dL — ABNORMAL HIGH (ref 70–99)
Potassium: 3.4 mmol/L — ABNORMAL LOW (ref 3.5–5.1)
Sodium: 137 mmol/L (ref 135–145)

## 2021-01-05 LAB — CBC
HCT: 39.2 % (ref 36.0–46.0)
Hemoglobin: 12.1 g/dL (ref 12.0–15.0)
MCH: 30.4 pg (ref 26.0–34.0)
MCHC: 30.9 g/dL (ref 30.0–36.0)
MCV: 98.5 fL (ref 80.0–100.0)
Platelets: 268 10*3/uL (ref 150–400)
RBC: 3.98 MIL/uL (ref 3.87–5.11)
RDW: 13.2 % (ref 11.5–15.5)
WBC: 6.9 10*3/uL (ref 4.0–10.5)
nRBC: 0 % (ref 0.0–0.2)

## 2021-01-05 LAB — RESP PANEL BY RT-PCR (FLU A&B, COVID) ARPGX2
Influenza A by PCR: NEGATIVE
Influenza B by PCR: NEGATIVE
SARS Coronavirus 2 by RT PCR: NEGATIVE

## 2021-01-05 LAB — TROPONIN I (HIGH SENSITIVITY)
Troponin I (High Sensitivity): 54 ng/L — ABNORMAL HIGH (ref <18)
Troponin I (High Sensitivity): 49 ng/L — ABNORMAL HIGH (ref <18)

## 2021-01-05 LAB — MAGNESIUM: Magnesium: 1.9 mg/dL (ref 1.7–2.4)

## 2021-01-05 LAB — BRAIN NATRIURETIC PEPTIDE: B Natriuretic Peptide: 2181.4 pg/mL — ABNORMAL HIGH (ref 0.0–100.0)

## 2021-01-05 MED ORDER — ALBUTEROL SULFATE HFA 108 (90 BASE) MCG/ACT IN AERS
1.0000 | INHALATION_SPRAY | Freq: Four times a day (QID) | RESPIRATORY_TRACT | Status: DC | PRN
  Filled 2021-01-05: qty 6.7

## 2021-01-05 MED ORDER — AMLODIPINE BESYLATE 10 MG PO TABS
10.0000 mg | ORAL_TABLET | Freq: Every day | ORAL | Status: DC
  Administered 2021-01-06: 10 mg via ORAL
  Filled 2021-01-05: qty 1

## 2021-01-05 MED ORDER — TRAZODONE HCL 50 MG PO TABS
50.0000 mg | ORAL_TABLET | Freq: Every evening | ORAL | Status: DC | PRN
  Administered 2021-01-05: 50 mg via ORAL
  Filled 2021-01-05: qty 1

## 2021-01-05 MED ORDER — CARVEDILOL 12.5 MG PO TABS: 12.5000 mg | ORAL_TABLET | ORAL | Status: DC

## 2021-01-05 MED ORDER — ACETAMINOPHEN 650 MG RE SUPP: 650.0000 mg | Freq: Four times a day (QID) | RECTAL | Status: DC | PRN

## 2021-01-05 MED ORDER — POLYETHYLENE GLYCOL 3350 17 G PO PACK: 17.0000 g | PACK | Freq: Every day | ORAL | Status: DC | PRN

## 2021-01-05 MED ORDER — GUAIFENESIN 100 MG/5ML PO SOLN: 5.0000 mL | ORAL | Status: DC | PRN

## 2021-01-05 MED ORDER — ASPIRIN EC 81 MG PO TBEC
81.0000 mg | DELAYED_RELEASE_TABLET | Freq: Every day | ORAL | Status: DC
  Administered 2021-01-05 – 2021-01-06 (×2): 81 mg via ORAL
  Filled 2021-01-05 (×2): qty 1

## 2021-01-05 MED ORDER — AMLODIPINE BESYLATE 5 MG PO TABS
10.0000 mg | ORAL_TABLET | Freq: Once | ORAL | Status: DC
  Filled 2021-01-05: qty 2

## 2021-01-05 MED ORDER — FUROSEMIDE 10 MG/ML IJ SOLN
20.0000 mg | Freq: Once | INTRAMUSCULAR | Status: AC
  Administered 2021-01-05: 20 mg via INTRAVENOUS
  Filled 2021-01-05: qty 2

## 2021-01-05 MED ORDER — CHLORHEXIDINE GLUCONATE CLOTH 2 % EX PADS: 6.0000 | MEDICATED_PAD | Freq: Every day | CUTANEOUS | Status: DC

## 2021-01-05 MED ORDER — ACETAMINOPHEN 325 MG PO TABS: 650.0000 mg | ORAL_TABLET | Freq: Four times a day (QID) | ORAL | Status: DC | PRN

## 2021-01-05 MED ORDER — ISOSORB DINITRATE-HYDRALAZINE 20-37.5 MG PO TABS
1.0000 | ORAL_TABLET | Freq: Three times a day (TID) | ORAL | Status: DC
  Administered 2021-01-05 – 2021-01-06 (×3): 1 via ORAL
  Filled 2021-01-05 (×4): qty 1

## 2021-01-05 MED ORDER — MONTELUKAST SODIUM 10 MG PO TABS
10.0000 mg | ORAL_TABLET | Freq: Every day | ORAL | Status: DC
  Administered 2021-01-05: 10 mg via ORAL
  Filled 2021-01-05: qty 1

## 2021-01-05 MED ORDER — HEPARIN SODIUM (PORCINE) 5000 UNIT/ML IJ SOLN
5000.0000 [IU] | Freq: Three times a day (TID) | INTRAMUSCULAR | Status: DC
  Administered 2021-01-05 – 2021-01-06 (×3): 5000 [IU] via SUBCUTANEOUS
  Filled 2021-01-05 (×4): qty 1

## 2021-01-05 MED ORDER — ATORVASTATIN CALCIUM 40 MG PO TABS
40.0000 mg | ORAL_TABLET | Freq: Every day | ORAL | Status: DC
  Administered 2021-01-05 – 2021-01-06 (×2): 40 mg via ORAL
  Filled 2021-01-05 (×2): qty 1

## 2021-01-05 MED ORDER — LABETALOL HCL 5 MG/ML IV SOLN
10.0000 mg | Freq: Once | INTRAVENOUS | Status: AC
  Administered 2021-01-05: 10 mg via INTRAVENOUS

## 2021-01-05 MED ORDER — BUSPIRONE HCL 5 MG PO TABS
7.5000 mg | ORAL_TABLET | Freq: Two times a day (BID) | ORAL | Status: DC
  Administered 2021-01-05 – 2021-01-06 (×3): 7.5 mg via ORAL
  Filled 2021-01-05: qty 2
  Filled 2021-01-05: qty 1
  Filled 2021-01-05: qty 2

## 2021-01-05 NOTE — ED Notes (Signed)
RN 6E to return call for report.

## 2021-01-05 NOTE — H&P (Signed)
Date: 01/05/2021               Patient Name:  Destiny Day MRN: [id number]  DOB: May 07, 1951 Age / Sex: 70 y.o., female   PCP: Cipriano Mile, NP         Medical Service: Internal Medicine Teaching Service         Attending Physician: Dr. Aldine Contes, MD    First Contact: Sanjuana Letters, DO Pager: Maryjean Morn [phone]  Second Contact: Jose Persia, MD Pager: Sharia Reeve [phone]       After Hours (After 5p/  First Contact Pager: [phone]  weekends / holidays): Second Contact Pager: [phone]   SUBJECTIVE   Chief Complaint: SOB  History of Present Illness:   Destiny Day is a 70 year old female with a past medical history of hypertension, hyperlipidemia, type 2 diabetes, Asthma/COPD, ESRD on HD, HFpEF who presents to the ED for concerns of shortness of breath.  Destiny Day states she was in her normal state of health until early hours of today at which time she was hungry and ate a very salty bowl of tomato soup.  She notes that these types of foods tend to trigger her heart failure but it was the only soup she had at home.  Approximately 2 3 hours later, she developed shortness of breath and recognized that this was her typical flare of heart failure.  She endorses a chronic cough that is productive with clear phlegm that she has been experiencing for at least 12 years.  She denies any chest pain, palpitations, dizziness, lower extremity swelling.  She states that she is on dialysis Monday Wednesday Friday, and went to her Friday session; session was completed and her dry weight was obtained.  She notes that she has been taking her blood pressure medications as prescribed and does not believe her blood pressure is usually this high  ED Course: On arrival to the ED, patient was noted to be afebrile at 98.1 with oxygen saturation of 98% on room air, she was hypertensive at 198/94 with heart rate of 92.  Initial lab work demonstrated elevated BNP of 2181 and troponin of 54 and then 49.  CMP and  CBC was largely unremarkable compared to patient's baseline.  Chest x-ray was obtained consistent with volume overload.  IMTS was consulted for admission.  Meds:  Current Meds  Medication Sig   amLODipine (NORVASC) 10 MG tablet Take 10 mg by mouth at bedtime.   aspirin EC 81 MG tablet Take 81 mg by mouth daily.   atorvastatin (LIPITOR) 40 MG tablet Take 1 tablet (40 mg total) by mouth daily.   azelastine (ASTELIN) 0.1 % nasal spray Place 1 spray into both nostrils 2 (two) times daily. (Patient taking differently: Place 1 spray into both nostrils daily as needed for allergies or rhinitis.)   BIDIL 20-37.5 MG tablet TAKE 1 TABLET BY MOUTH THREE TIMES A DAY   busPIRone (BUSPAR) 7.5 MG tablet Take 7.5 mg by mouth 2 (two) times daily.   carvedilol (COREG) 12.5 MG tablet Take 12.5 mg by mouth See admin instructions. Take 1 tablet (12.5 mg) by mouth once daily in the evening on Mondays, Wednesdays, & Fridays. (Dialysis) Take 1 tablet (12.5 mg) by mouth twice daily on Sundays, Tuesdays, Thursdays, & Saturdays. (Non Dialysis)   EPINEPHrine (AUVI-Q) 0.3 mg/0.3 mL IJ SOAJ injection Inject 0.3 mg into the muscle as needed for anaphylaxis.   ferric citrate (AURYXIA) 1 GM 210 MG(Fe) tablet Take 210-420 mg by mouth See admin  instructions. Take 2 tablets (420 mg) by mouth with each meal & take 1 tablet (210 mg) by mouth with snacks.   fluticasone (FLONASE) 50 MCG/ACT nasal spray Place 2 sprays into both nostrils daily as needed for allergies or rhinitis.   gabapentin (NEURONTIN) 100 MG capsule TAKE 1 CAPSULE (100 MG TOTAL) BY MOUTH AT BEDTIME. WHEN NECESSARY FOR NEUROPATHY PAIN (Patient taking differently: Take 100 mg by mouth at bedtime as needed (pain). When necessary for neuropathy pain)   glipiZIDE (GLUCOTROL) 5 MG tablet Take 5 mg by mouth daily before breakfast.   lidocaine-prilocaine (EMLA) cream Apply 1 application topically 3 (three) times a week. 1-2 hours prior to dialysis   montelukast (SINGULAIR) 10  MG tablet TAKE 1 TABLET BY MOUTH EVERYDAY AT BEDTIME   multivitamin (RENA-VIT) TABS tablet Take 1 tablet by mouth daily.    omeprazole (PRILOSEC) 20 MG capsule Take 20 mg by mouth daily.    traZODone (DESYREL) 50 MG tablet Take 50 mg by mouth at bedtime as needed for sleep.   Vitamin D, Ergocalciferol, (DRISDOL) 1.25 MG (50000 UT) CAPS capsule Take 50,000 Units by mouth every Sunday. Takes on Sunday    Past Medical History:  Diagnosis Date   Abdominal bruit    Anemia    Anxiety    Arthritis    Osteoarthritis   Asthma    Cervical disc disease    "pinced nerve"   CHF (congestive heart failure) ([place])    Complication of anesthesia    " after I got home from my last procedure, I started itching."   Diabetes mellitus    Type II   Diverticulitis    ESRD (end stage renal disease) ([place])    dialysis - M/W/F- [place]   GERD (gastroesophageal reflux disease)    from medications   GI bleed 03/31/2013   Head injury 07/2017   History of hiatal hernia    Hyperlipidemia    Hypertension    Neuropathy    left leg   Osteoporosis    Peripheral vascular disease ([place])    Pneumonia    "very young" and a few years ago   PONV (postoperative nausea and vomiting)    Seasonal allergies    Shortness of breath dyspnea    WIth exertion, when fluid builds   Sleep apnea    can't afford cpap     Past Surgical History:  Procedure Laterality Date   A/V SHUNTOGRAM N/A 09/22/2016   Procedure: A/V Shuntogram - left arm;  Surgeon: Serafina Mitchell, MD;  Location: [place] [facility];  Service: Cardiovascular;  Laterality: N/A;   A/V SHUNTOGRAM N/A 03/22/2018   Procedure: A/V SHUNTOGRAM - left arm;  Surgeon: Serafina Mitchell, MD;  Location: [place] [facility];  Service: Cardiovascular;  Laterality: N/A;   A/V SHUNTOGRAM Left 11/17/2018   Procedure: A/V SHUNTOGRAM;  Surgeon: Angelia Mould, MD;  Location: [facility] [facility];  Service: Cardiovascular;  Laterality: Left;   ABDOMINAL HYSTERECTOMY  1993`    AMPUTATION Left 09/01/2016   Procedure: LEFT FOOT TRANSMETATARSAL AMPUTATION;  Surgeon: Newt Minion, MD;  Location: [place];  Service: Orthopedics;  Laterality: Left;   AMPUTATION Right 08/11/2017   Procedure: RIGHT GREAT TOE AMPUTATION DIGIT;  Surgeon: Rosetta Posner, MD;  Location: [place];  Service: Vascular;  Laterality: Right;   AMPUTATION Right 12/31/2017   Procedure: RIGHT TRANSMETATARSAL AMPUTATION;  Surgeon: Newt Minion, MD;  Location: [place];  Service: Orthopedics;  Laterality: Right;  AV FISTULA PLACEMENT Left 04/21/2016   Procedure: INSERTION OF ARTERIOVENOUS (AV) GORE-TEX GRAFT ARM LEFT;  Surgeon: Elam Dutch, MD;  Location: [place];  Service: Vascular;  Laterality: Left;   BASCILIC VEIN TRANSPOSITION Left 07/10/2014   Procedure: BASCILIC VEIN TRANSPOSITION;  Surgeon: Angelia Mould, MD;  Location: [place];  Service: Vascular;  Laterality: Left;   [place] Right 11/08/2014   Procedure: FIRST STAGE BASILIC VEIN TRANSPOSITION;  Surgeon: Angelia Mould, MD;  Location: [place];  Service: Vascular;  Laterality: Right;   [place] Right 01/18/2015   Procedure: SECOND STAGE BASILIC VEIN TRANSPOSITION;  Surgeon: Angelia Mould, MD;  Location: [place];  Service: Vascular;  Laterality: Right;   CRANIOTOMY N/A 08/23/2017   Procedure: CRANIOTOMY HEMATOMA EVACUATION SUBDURAL;  Surgeon: Ashok Pall, MD;  Location: [place];  Service: Neurosurgery;  Laterality: N/A;   CRANIOTOMY Left 08/24/2017   Procedure: CRANIOTOMY FOR RECURRENT ACUTE SUBDURAL HEMATOMA;  Surgeon: Ashok Pall, MD;  Location: [place];  Service: Neurosurgery;  Laterality: Left;   ESOPHAGOGASTRODUODENOSCOPY N/A 03/31/2013   Procedure: ESOPHAGOGASTRODUODENOSCOPY (EGD);  Surgeon: Gatha Mayer, MD;  Location: [facility] ENDOSCOPY;  Service: Endoscopy;  Laterality: N/A;   EYE SURGERY     laser surgery   FEMORAL-POPLITEAL BYPASS GRAFT Left 07/08/2016   Procedure: LEFT  FEMORAL-BELOW KNEE POPLITEAL  ARTERY BYPASS GRAFT USING 6MM X 80 CM PROPATEN GORETEX GRAFT WITH RINGS.;  Surgeon: Rosetta Posner, MD;  Location: [facility] OR;  Service: Vascular;  Laterality: Left;   FEMORAL-POPLITEAL BYPASS GRAFT Right 08/11/2017   Procedure: RIGHT FEMORAL TO BELOW KNEE POPLITEAL ARTERKY  BYPASS GRAFT USING 6MM RINGED PROPATEN GRAFT;  Surgeon: Rosetta Posner, MD;  Location: [place];  Service: Vascular;  Laterality: Right;   FISTULOGRAM Left 10/29/2014   Procedure: FISTULOGRAM;  Surgeon: Angelia Mould, MD;  Location: [facility] CATH LAB;  Service: Cardiovascular;  Laterality: Left;   GAS/FLUID EXCHANGE Left 12/20/2018   Procedure: AIR GAS EXCHANGE;  Surgeon: Jalene Mullet, MD;  Location: [place];  Service: Ophthalmology;  Laterality: Left;   INSERTION OF DIALYSIS CATHETER N/A 09/05/2020   Procedure: INSERTION OF DIALYSIS CATHETER;  Surgeon: Cherre Robins, MD;  Location: [place];  Service: Vascular;  Laterality: N/A;   KNEE ARTHROSCOPY Right 05/06/2018   Procedure: RIGHT KNEE ARTHROSCOPY;  Surgeon: Newt Minion, MD;  Location: [place];  Service: Orthopedics;  Laterality: Right;   KNEE ARTHROSCOPY Right 06/10/2018   Procedure: RIGHT KNEE ARTHROSCOPY AND DEBRIDEMENT;  Surgeon: Newt Minion, MD;  Location: [place];  Service: Orthopedics;  Laterality: Right;   LIGATION OF ARTERIOVENOUS  FISTULA Left 04/21/2016   Procedure: LIGATION OF ARTERIOVENOUS  FISTULA LEFT ARM;  Surgeon: Elam Dutch, MD;  Location: [place];  Service: Vascular;  Laterality: Left;   LOWER EXTREMITY ANGIOGRAPHY N/A 04/06/2017   Procedure: Lower Extremity Angiography - Right;  Surgeon: Serafina Mitchell, MD;  Location: [place] [facility];  Service: Cardiovascular;  Laterality: N/A;   MEMBRANE PEEL Left 12/20/2018   Procedure: MEMBRANE PEEL;  Surgeon: Jalene Mullet, MD;  Location: [place];  Service: Ophthalmology;  Laterality: Left;   PATCH ANGIOPLASTY Right 01/18/2015   Procedure: BASILIC VEIN PATCH ANGIOPLASTY USING VASCUGUARD PATCH;  Surgeon: Angelia Mould, MD;  Location: [place];  Service: Vascular;  Laterality: Right;   PERIPHERAL VASCULAR BALLOON ANGIOPLASTY  09/22/2016   Procedure: Peripheral Vascular Balloon Angioplasty;  Surgeon: Serafina Mitchell, MD;  Location: [place] [facility];  Service: Cardiovascular;;  Lt. Fistula   PERIPHERAL VASCULAR BALLOON ANGIOPLASTY Left 03/22/2018   Procedure: PERIPHERAL VASCULAR BALLOON ANGIOPLASTY;  Surgeon: Serafina Mitchell, MD;  Location: [place] [facility];  Service: Cardiovascular;  Laterality: Left;  Arm shunt   PERIPHERAL VASCULAR CATHETERIZATION N/A 06/23/2016   Procedure: Abdominal Aortogram w/Lower Extremity;  Surgeon: Serafina Mitchell, MD;  Location: [place] [facility];  Service: Cardiovascular;  Laterality: N/A;   PERIPHERAL VASCULAR CATHETERIZATION  06/23/2016   Procedure: Peripheral Vascular Intervention;  Surgeon: Serafina Mitchell, MD;  Location: [place] [facility];  Service: Cardiovascular;;  lt common and external illiac artery   PHOTOCOAGULATION WITH LASER Left 12/20/2018   Procedure: PHOTOCOAGULATION WITH LASER;  Surgeon: Jalene Mullet, MD;  Location: [place];  Service: Ophthalmology;  Laterality: Left;   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  Tobacco Use   Smoking status: Former     Packs/day: 0.35    Years: 40.00    Pack years: 14.00    Types: Cigarettes    Quit date: 05/10/2012    Years since quitting: 8.6   Smokeless tobacco: Never  Vaping Use   Vaping Use: Never used  Substance and Sexual Activity   Alcohol use: No   Drug use: No    Comment: marijuana; quit in early 1980's   Sexual activity: Yes  Other Topics Concern   Not on file  Social History Narrative   Patient lives at home with spouse.   Caffeine Use: 1 cup daily   Social Determinants of Health   Financial Resource Strain: Not on file  Food Insecurity: No Food Insecurity   Worried About Charity fundraiser in the Last Year: Never true   Ran Out of Food in the Last Year: Never true  Transportation Needs: No Transportation Needs   Lack of Transportation (Medical): No   Lack of Transportation (Non-Medical): No  Physical Activity: Not on file  Stress: Not on file  Social Connections: Not on file  Intimate Partner Violence: Not on file   Family History:  Family History  Problem Relation Age of Onset   Other Mother        not sure of cause of death   Diabetes Father    Pancreatic cancer Maternal Grandmother    Diabetes Sister    Diabetes Sister    Colon cancer Neg Hx    Allergies: Allergies as of 01/05/2021 - Review Complete 01/05/2021  Allergen Reaction Noted   Adhesive  [tape] Hives 05/18/2019   Lisinopril Cough 08/22/2011   Prednisone Swelling and Other (See Comments) 09/08/2013   Review of Systems: A complete ROS was negative except as per HPI.   OBJECTIVE:   Physical Exam: Blood pressure (!) 151/111,  pulse 81, temperature 98.1 F (36.7 C), temperature source  Oral, resp. rate 12, SpO2 97 %.   Physical Exam Vitals and nursing note reviewed.  Constitutional:      General: She is not in acute distress.    Appearance: She is normal weight.  HENT:     Head: Normocephalic and atraumatic.  Cardiovascular:     Rate and Rhythm: Normal rate and regular rhythm.     Heart  sounds: Murmur (systolic) heard.  Pulmonary:     Effort: Pulmonary effort is normal. No tachypnea, accessory muscle usage or respiratory distress.     Breath sounds: Normal breath sounds. No wheezing, rhonchi or rales.  Abdominal:     General: Bowel sounds are normal.     Palpations: Abdomen is soft. There is no mass.     Tenderness: There is no abdominal tenderness.  Musculoskeletal:     Right lower leg: No edema.     Left lower leg: No edema.  Skin:    General: Skin is warm and dry.  Neurological:     Mental Status: She is alert and oriented to person, place, and time.  Psychiatric:        Mood and Affect: Mood is anxious.        Behavior: Behavior normal. Behavior is not agitated.   Labs: CBC    Component Value Date/Time   WBC 6.9 01/05/2021 0616   RBC 3.98 01/05/2021 0616   HGB 12.1 01/05/2021 0616   HGB 11.2 12/08/2019 1300   HCT 39.2 01/05/2021 0616   HCT 32.1 (L) 12/08/2019 1300   PLT 268 01/05/2021 0616   PLT 269 12/08/2019 1300   MCV 98.5 01/05/2021 0616   MCV 92 12/08/2019 1300   MCH 30.4 01/05/2021 0616   MCHC 30.9 01/05/2021 0616   RDW 13.2 01/05/2021 0616   RDW 12.2 12/08/2019 1300   LYMPHSABS 2.0 12/08/2019 1300   MONOABS 0.3 06/25/2019 2047   EOSABS 0.4 12/08/2019 1300   BASOSABS 0.0 12/08/2019 1300    CMP     Component Value Date/Time   NA 137 01/05/2021 0616   NA 135 (A) 07/15/2016 0000   K 3.4 (L) 01/05/2021 0616   CL 94 (L) 01/05/2021 0616   CO2 27 01/05/2021 0616   GLUCOSE 130 (H) 01/05/2021 0616   BUN 21 01/05/2021 0616   BUN 30 (A) 07/15/2016 0000   CREATININE 5.50 (H) 01/05/2021 0616   CALCIUM 8.3 (L) 01/05/2021 0616   PROT 5.8 (L) 05/22/2019 0900   ALBUMIN 2.9 (L) 06/26/2019 0500   AST 31 05/22/2019 0900   ALT 20 05/22/2019 0900   ALKPHOS 142 (H) 05/22/2019 0900   BILITOT 0.4 05/22/2019 0900   GFRNONAA 8 (L) 01/05/2021 0616   GFRAA 6 (L) 06/26/2019 0500   Imaging: DG Chest 2 View  Result Date: 01/05/2021 CLINICAL DATA:  Chest  pain EXAM: CHEST - 2 VIEW COMPARISON:  Radiograph 09/05/2020 FINDINGS: Mild pulmonary vascular congestion and cephalization. Hazy opacity with a basilar gradient with peripheral septal thickening. No focal consolidation. No pneumothorax or visible effusion. Cardiomegaly with calcified and tortuous aorta. No acute osseous or soft tissue abnormality. Degenerative changes are present in the imaged spine and shoulders. High-riding appearance of the right humeral head may reflect some underlying reticular cuff insufficiency. IMPRESSION: Features most compatible with CHF including cardiomegaly, features of pulmonary edema and vascular congestion. Aortic Atherosclerosis (ICD10-I70.0). Electronically Signed   By: [name] M.D.   On: 01/05/2021 06:41    EKG, personally reviewed - Sinus rhythm. No ST or T wave  changes concerning for ischemia.   ASSESSMENT & PLAN:    Assessment & Plan by Problem: Active Problems:   Acute on chronic heart failure (HCC)   HALIEY ROMBERG is a 70 y.o. with pertinent PMH of HFpEF, HTN, T2DM, ESRD who presented with SOB and admitted for acute HFpEf exacerbation, on hospital day 0  #Acute HFpEF Exacerbation  Given that no other triggers are present, I suspect this is in the setting of large salt intake.  At this time, patient is only mildly hypervolemic.  She received a one-time dose of Lasix in the ED given that she makes a small amount of urine.  Discussed with nephrology who plans to diurese either tonight or tomorrow morning.  - Supplemental oxygen as needed to maintain oxygen above 88% - Strict ins and outs - Daily weights - Hold further Lasix unless patient becomes symptomatic  # Severe asymptomatic hypertension This may be in the setting of medication non-compliance. Patient denies this, but BP has often been elevated at previous office visits. It was within goal at an office visit on 12/24/2020 though.  It may also be in the setting of large salt intake.  She  received a one-time dose of labetalol in the ED.  At this time, will restart her home meds.  - Restart amlodipine 10 mg daily - Restart BiDil 20-37.5 mg 3 times daily - Monitor blood pressure closely while admitted  # ESRD - Nephrology following; appreciate their recommendations and management of HD  Diet: Normal VTE: Heparin IVF: None,None Code: Full  Prior to Admission Living Arrangement: Home, living with her husband Anticipated Discharge Location: Home Barriers to Discharge: Continued medical evaluation  Dispo: Admit patient to Observation with expected length of stay less than 2 midnights.  Signed: Dr. Jose Persia Internal Medicine PGY-2  Pager: [phone] After 5pm on weekdays and 1pm on weekends: On Call pager [phone]  01/05/2021, 8:19 PM

## 2021-01-05 NOTE — ED Provider Notes (Signed)
[facility] EMERGENCY DEPARTMENT Provider Note   CSN: [id number] Arrival date & time: 01/05/21  0516     History Chief Complaint  Patient presents with   Shortness of Breath   Chest Pain    Destiny Day is a 70 y.o. female.  HPI  70 year old female with past medical history of HTN, HLD, DM, COPD, ESRD on HD, CHF presents emergency department concern for shortness of breath.  Patient goes to hemodialysis every MWF, states that she has been compliant.  Her last session was Friday, full session.  She states they brought her back to her dry weight.  Over the weekend she started feeling weak and short of breath, coughing up clear phlegm.  She does not wear oxygen at home.  Denies any fever.  She makes minimal urine, is not on a diuretic at home.  Past Medical History:  Diagnosis Date   Abdominal bruit    Anemia    Anxiety    Arthritis    Osteoarthritis   Asthma    Cervical disc disease    "pinced nerve"   CHF (congestive heart failure) ([place])    Complication of anesthesia    " after I got home from my last procedure, I started itching."   Diabetes mellitus    Type II   Diverticulitis    ESRD (end stage renal disease) ([place])    dialysis - M/W/F- [place]   GERD (gastroesophageal reflux disease)    from medications   GI bleed 03/31/2013   Head injury 07/2017   History of hiatal hernia    Hyperlipidemia    Hypertension    Neuropathy    left leg   Osteoporosis    Peripheral vascular disease ([place])    Pneumonia    "very young" and a few years ago   PONV (postoperative nausea and vomiting)    Seasonal allergies    Shortness of breath dyspnea    WIth exertion, when fluid builds   Sleep apnea    can't afford cpap     Patient Active Problem List   Diagnosis Date Noted   Macular pucker, right eye 10/29/2020   Nausea with vomiting, unspecified 09/10/2020   Vitreomacular traction syndrome, 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   Pseudophakia of left eye 07/02/2020    Diabetic macular edema of right eye with proliferative retinopathy associated with type 2 diabetes mellitus ([place]) 07/02/2020   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 type 2 diabetes mellitus ([place]) 04/23/2020   Nuclear sclerotic cataract of right eye 04/23/2020   Allergy, unspecified, initial encounter 04/04/2020   Anaphylactic shock, unspecified, initial encounter 04/04/2020   Secondary hypertension, unspecified 12/13/2019   Other disorders of phosphorus metabolism 08/24/2019   Paroxysmal atrial fibrillation ([place]) 06/13/2019   Hypocalcemia 04/17/2019   Old bucket handle tear of lateral meniscus of right knee    Old peripheral tear of medial meniscus of right knee    Staphylococcal arthritis, unspecified joint ([place]) 05/11/2018   Septic arthritis of knee, right ([place]) 05/06/2018   Degenerative tear of posterior horn of medial meniscus, right    Old complex tear of lateral meniscus of right knee    Loose body in knee, right    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[place]) 02/03/2018   End-stage renal disease on hemodialysis ([place])    Respiratory failure ([place]) 01/21/2018   Achilles tendon contracture, right 11/09/2017   Labile blood glucose    Anemia of infection and chronic disease  Black stool    Right sided weakness    Subdural hemorrhage following injury ([place]) 08/30/2017   Diabetic peripheral neuropathy (HCC)    Chronic obstructive pulmonary disease (HCC)    Subdural hematoma (HCC) 08/24/2017   Traumatic subdural hematoma (HCC) 08/23/2017   Fall    SDH (subdural hematoma) (HCC)    Acute respiratory failure with hypoxia (HCC)    Diabetes mellitus type 2 in nonobese (HCC)    Leukocytosis    Labile blood pressure    Generalized anxiety disorder    Debility 08/16/2017   Amputated great toe of right foot (HCC)    Amputated toe of left foot ([place])    Post-operative pain    ESRD on dialysis [facility])    Uncontrolled diabetes mellitus  with right eye affected by proliferative retinopathy and traction retinal detachment involving macula, with long-term current use of insulin (HCC)    Anxiety state    Acute blood loss anemia    Diarrhea, unspecified 01/16/2017   Dependence on renal dialysis ([place]) 01/06/2017   Idiopathic chronic venous hypertension of both lower extremities with inflammation 10/13/2016   S/P transmetatarsal amputation of foot, left ([place]) 09/21/2016   Onychomycosis 08/15/2016   Unspecified protein-calorie malnutrition ([place]) 07/29/2016   Other mechanical complication of femoral arterial graft (bypass), initial encounter ([place]) 07/14/2016   Other pneumonia, unspecified organism 07/11/2016   Age-related osteoporosis without current pathological fracture 07/11/2016   Cervical disc disorder, unspecified, unspecified cervical region 07/11/2016   Diverticulitis of intestine, part unspecified, without perforation or abscess without bleeding 07/11/2016   Unspecified abdominal hernia without obstruction or gangrene 07/11/2016   Gastro-esophageal reflux disease with esophagitis 07/11/2016   Primary generalized (osteo)arthritis 07/11/2016   Peripheral vascular disease ([place]) 07/08/2016   Cellulitis of left toe 06/29/2016   Atherosclerosis of native artery of left lower extremity with gangrene ([place]) 69/62/9528   Complication of vascular dialysis catheter 02/29/2016   Encounter for screening for other viral diseases 01/10/2016   Encounter for removal of sutures 10/29/2015   Other fluid overload 10/23/2015   Anemia in chronic kidney disease 10/16/2015   Essential (primary) hypertension 10/16/2015   Acute pulmonary edema ([place]) 10/16/2015   Coagulation defect, unspecified ([place]) 10/16/2015   Acute respiratory failure ([place]) 10/16/2015   Hyperlipidemia, unspecified 10/16/2015   Iron deficiency anemia, unspecified 10/16/2015   Pain, unspecified 10/16/2015   Pruritus, unspecified 10/16/2015   Secondary hyperparathyroidism of  renal origin ([place]) 10/16/2015   Shortness of breath 10/16/2015   GERD (gastroesophageal reflux disease) 10/07/2015   ARF (acute renal failure) ([place])    Hypothermia 06/12/2015   End stage renal disease ([place]) 06/12/2015   Unspecified diastolic (congestive) heart failure ([place]) 07/30/2014   CKD (chronic kidney disease) stage 4, GFR 15-29 ml/min (HCC) 06/27/2014   Hypertensive heart disease 05/25/2013   CKD (chronic kidney disease) stage 3, GFR 30-59 ml/min (HCC) 05/25/2013   Pulmonary edema 05/23/2013   Type 2 diabetes mellitus with diabetic chronic kidney disease ([place]) 05/23/2013   Type 2 diabetes mellitus with hyperosmolar nonketotic hyperglycemia ([place]) 04/13/2013   Acute on chronic diastolic heart failure ([place]) 04/13/2013   Gastrointestinal hemorrhage, unspecified 03/31/2013   Hypokalemia 08/24/2012   Hyperlipidemia associated with type 2 diabetes mellitus ([place]) 12/27/2007   Allergic rhinitis due to pollen 12/27/2007   Unspecified asthma, uncomplicated 41/32/4401    Past Surgical History:  Procedure Laterality Date   A/V SHUNTOGRAM N/A 09/22/2016   Procedure: A/V Shuntogram - left arm;  Surgeon: Serafina Mitchell, MD;  Location: [facility]  CV LAB;  Service: Cardiovascular;  Laterality: N/A;   A/V SHUNTOGRAM N/A 03/22/2018   Procedure: A/V SHUNTOGRAM - left arm;  Surgeon: Serafina Mitchell, MD;  Location: [facility] CV LAB;  Service: Cardiovascular;  Laterality: N/A;   A/V SHUNTOGRAM Left 11/17/2018   Procedure: A/V SHUNTOGRAM;  Surgeon: Angelia Mould, MD;  Location: [facility] CV LAB;  Service: Cardiovascular;  Laterality: Left;   ABDOMINAL HYSTERECTOMY  1993`   AMPUTATION Left 09/01/2016   Procedure: LEFT FOOT TRANSMETATARSAL AMPUTATION;  Surgeon: Newt Minion, MD;  Location: [facility];  Service: Orthopedics;  Laterality: Left;   AMPUTATION Right 08/11/2017   Procedure: RIGHT GREAT TOE AMPUTATION DIGIT;  Surgeon: Rosetta Posner, MD;  Location: [facility];  Service: Vascular;  Laterality:  Right;   AMPUTATION Right 12/31/2017   Procedure: RIGHT TRANSMETATARSAL AMPUTATION;  Surgeon: Newt Minion, MD;  Location: [facility];  Service: Orthopedics;  Laterality: Right;   AV FISTULA PLACEMENT Left 04/21/2016   Procedure: INSERTION OF ARTERIOVENOUS (AV) GORE-TEX GRAFT ARM LEFT;  Surgeon: Elam Dutch, MD;  Location: [place] [facility];  Service: Vascular;  Laterality: Left;   BASCILIC VEIN TRANSPOSITION Left 07/10/2014   Procedure: BASCILIC VEIN TRANSPOSITION;  Surgeon: Angelia Mould, MD;  Location: [facility];  Service: Vascular;  Laterality: Left;   [place] Right 11/08/2014   Procedure: FIRST STAGE BASILIC VEIN TRANSPOSITION;  Surgeon: Angelia Mould, MD;  Location: [facility];  Service: Vascular;  Laterality: Right;   [place] Right 01/18/2015   Procedure: SECOND STAGE BASILIC VEIN TRANSPOSITION;  Surgeon: Angelia Mould, MD;  Location: [facility];  Service: Vascular;  Laterality: Right;   CRANIOTOMY N/A 08/23/2017   Procedure: CRANIOTOMY HEMATOMA EVACUATION SUBDURAL;  Surgeon: Ashok Pall, MD;  Location: [facility];  Service: Neurosurgery;  Laterality: N/A;   CRANIOTOMY Left 08/24/2017   Procedure: CRANIOTOMY FOR RECURRENT ACUTE SUBDURAL HEMATOMA;  Surgeon: Ashok Pall, MD;  Location: [facility];  Service: Neurosurgery;  Laterality: Left;   ESOPHAGOGASTRODUODENOSCOPY N/A 03/31/2013   Procedure: ESOPHAGOGASTRODUODENOSCOPY (EGD);  Surgeon: Gatha Mayer, MD;  Location: [facility] ENDOSCOPY;  Service: Endoscopy;  Laterality: N/A;   EYE SURGERY     laser surgery   FEMORAL-POPLITEAL BYPASS GRAFT Left 07/08/2016   Procedure: LEFT  FEMORAL-BELOW KNEE POPLITEAL ARTERY BYPASS GRAFT USING 6MM X 80 CM PROPATEN GORETEX GRAFT WITH RINGS.;  Surgeon: Rosetta Posner, MD;  Location[facility];  Service: Vascular;  Laterality: Left;   FEMORAL-POPLITEAL BYPASS GRAFT Right 08/11/2017   Procedure: RIGHT FEMORAL TO BELOW KNEE POPLITEAL ARTERKY  BYPASS GRAFT USING 6MM RINGED PROPATEN GRAFT;  Surgeon:  Rosetta Posner, MD;  Location: [facility];  Service: Vascular;  Laterality: Right;   FISTULOGRAM Left 10/29/2014   Procedure: FISTULOGRAM;  Surgeon: Angelia Mould, MD;  Location: [facility] CATH LAB;  Service: Cardiovascular;  Laterality: Left;   GAS/FLUID EXCHANGE Left 12/20/2018   Procedure: AIR GAS EXCHANGE;  Surgeon: Jalene Mullet, MD;  Location: [facility];  Service: Ophthalmology;  Laterality: Left;   INSERTION OF DIALYSIS CATHETER N/A 09/05/2020   Procedure: INSERTION OF DIALYSIS CATHETER;  Surgeon: Cherre Robins, MD;  Location: [place];  Service: Vascular;  Laterality: N/A;   KNEE ARTHROSCOPY Right 05/06/2018   Procedure: RIGHT KNEE ARTHROSCOPY;  Surgeon: Newt Minion, MD;  Location: [facility];  Service: Orthopedics;  Laterality: Right;   KNEE ARTHROSCOPY Right 06/10/2018   Procedure: RIGHT KNEE ARTHROSCOPY AND DEBRIDEMENT;  Surgeon: Newt Minion, MD;  Location: [facility];  Service: Orthopedics;  Laterality: Right;   LIGATION OF ARTERIOVENOUS  FISTULA Left 04/21/2016   Procedure: LIGATION OF ARTERIOVENOUS  FISTULA LEFT ARM;  Surgeon: Elam Dutch, MD;  Location: [facility];  Service: Vascular;  Laterality: Left;   LOWER EXTREMITY ANGIOGRAPHY N/A 04/06/2017   Procedure: Lower Extremity Angiography - Right;  Surgeon: Serafina Mitchell, MD;  Location: [place] CV LAB;  Service: Cardiovascular;  Laterality: N/A;   MEMBRANE PEEL Left 12/20/2018   Procedure: MEMBRANE PEEL;  Surgeon: Jalene Mullet, MD;  Location: [facility];  Service: Ophthalmology;  Laterality: Left;   PATCH ANGIOPLASTY Right 01/18/2015   Procedure: BASILIC VEIN PATCH ANGIOPLASTY USING VASCUGUARD PATCH;  Surgeon: Angelia Mould, MD;  Location: [place];  Service: Vascular;  Laterality: Right;   PERIPHERAL VASCULAR BALLOON ANGIOPLASTY  09/22/2016   Procedure: Peripheral Vascular Balloon Angioplasty;  Surgeon: Serafina Mitchell, MD;  Location: [place] CV LAB;  Service: Cardiovascular;;  Lt. Fistula   PERIPHERAL VASCULAR BALLOON ANGIOPLASTY Left  03/22/2018   Procedure: PERIPHERAL VASCULAR BALLOON ANGIOPLASTY;  Surgeon: Serafina Mitchell, MD;  Location: [facility] CV LAB;  Service: Cardiovascular;  Laterality: Left;  Arm shunt   PERIPHERAL VASCULAR CATHETERIZATION N/A 06/23/2016   Procedure: Abdominal Aortogram w/Lower Extremity;  Surgeon: Serafina Mitchell, MD;  Location: [facility] CV LAB;  Service: Cardiovascular;  Laterality: N/A;   PERIPHERAL VASCULAR CATHETERIZATION  06/23/2016   Procedure: Peripheral Vascular Intervention;  Surgeon: Serafina Mitchell, MD;  Location: [place] CV LAB;  Service: Cardiovascular;;  lt common and external illiac artery   PHOTOCOAGULATION WITH LASER Left 12/20/2018   Procedure: PHOTOCOAGULATION WITH LASER;  Surgeon: Jalene Mullet, MD;  Location: [place];  Service: Ophthalmology;  Laterality: Left;   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   Procedure: REPAIR OF COMPLEX TRACTION RETINAL DETACHMENT;  Surgeon: Jalene Mullet, MD;  Location: [place];  Service: Ophthalmology;  Laterality: Left;   REVISION OF ARTERIOVENOUS GORETEX GRAFT Left 09/05/2020   Procedure: REVISION OF ARTERIOVENOUS GORETEX GRAFT LEFT;  Surgeon: Cherre Robins, MD;  Location: [name] OR;  Service: Vascular;  Laterality: Left;     OB History   No obstetric history on file.     Family History  Problem Relation Age of Onset   Other Mother        not sure of cause of death   Diabetes Father    Pancreatic cancer Maternal Grandmother    Diabetes Sister    Diabetes Sister    Colon cancer Neg Hx     Social History   Tobacco Use   Smoking status: Former    Packs/day: 0.35    Years: 40.00    Pack years: 14.00    Types: Cigarettes    Quit date: 05/10/2012    Years since quitting: 8.6   Smokeless tobacco: Never  Vaping Use   Vaping Use: Never used  Substance Use Topics   Alcohol use: No   Drug use: No    Comment: marijuana; quit in early 1980's    Home Medications Prior to Admission medications    Medication Sig Start Date End Date Taking? Authorizing Provider  amLODipine (NORVASC) 10 MG tablet Take 10 mg by mouth at bedtime.   Yes [provider]  aspirin EC 81 MG tablet Take 81 mg by mouth daily.   Yes [provider]  atorvastatin (LIPITOR) 40 MG tablet Take 1 tablet (40 mg total) by mouth daily. 01/05/20  Yes Adrian Prows, MD  azelastine (ASTELIN) 0.1 % nasal spray Place 1 spray into both nostrils 2 (two) times daily. Patient taking differently: Place 1 spray into both nostrils daily as needed for allergies or rhinitis. 12/07/19  Yes Valentina Shaggy, MD  BIDIL 20-37.5 MG tablet TAKE 1 TABLET BY MOUTH THREE TIMES A DAY 11/05/20  Yes Adrian Prows, MD  busPIRone (BUSPAR) 7.5 MG tablet Take 7.5 mg by mouth 2 (two) times daily. 10/29/19  Yes [provider]  carvedilol (COREG) 12.5 MG tablet Take 12.5 mg by mouth See admin instructions. Take 1 tablet (12.5 mg) by mouth once daily in the evening on Mondays, Wednesdays, & Fridays. (Dialysis) Take 1 tablet (12.5 mg) by mouth twice daily on Sundays, Tuesdays, Thursdays, & Saturdays. (Non Dialysis)   Yes [provider]  EPINEPHrine (AUVI-Q) 0.3 mg/0.3 mL IJ SOAJ injection Inject 0.3 mg into the muscle as needed for anaphylaxis. 12/24/20  Yes Valentina Shaggy, MD  ferric citrate (AURYXIA) 1 GM 210 MG(Fe) tablet Take 210-420 mg by mouth See admin instructions. Take 2 tablets (420 mg) by mouth with each meal & take 1 tablet (210 mg) by mouth with snacks.   Yes [provider]  fluticasone (FLONASE) 50 MCG/ACT nasal spray Place 2 sprays into both nostrils daily as needed for allergies or rhinitis. 12/07/19  Yes Valentina Shaggy, MD  gabapentin (NEURONTIN) 100 MG capsule TAKE 1 CAPSULE (100 MG TOTAL) BY MOUTH AT BEDTIME. WHEN NECESSARY FOR NEUROPATHY PAIN Patient taking differently: Take 100 mg by mouth at bedtime as needed (pain). When necessary for neuropathy pain 12/13/18  Yes Newt Minion, MD   glipiZIDE (GLUCOTROL) 5 MG tablet Take 5 mg by mouth daily before breakfast.   Yes [provider]  lidocaine-prilocaine (EMLA) cream Apply 1 application topically 3 (three) times a week. 1-2 hours prior to dialysis 01/07/18  Yes [provider]  montelukast (SINGULAIR) 10 MG tablet TAKE 1 TABLET BY MOUTH EVERYDAY AT BEDTIME 12/24/20  Yes Valentina Shaggy, MD  multivitamin (RENA-VIT) TABS tablet Take 1 tablet by mouth daily.  01/24/15  Yes [provider]  omeprazole (PRILOSEC) 20 MG capsule Take 20 mg by mouth daily.    Yes [provider]  traZODone (DESYREL) 50 MG tablet Take 50 mg by mouth at bedtime as needed for sleep. 10/29/19  Yes [provider]  Vitamin D, Ergocalciferol, (DRISDOL) 1.25 MG (50000 UT) CAPS capsule Take 50,000 Units by mouth every Sunday. Takes on Sunday   Yes [provider]  albuterol [facility] HFA) 108 (90 Base) MCG/ACT inhaler 4 puffs every 4-6 hours as needed Patient not taking: Reported on 01/05/2021 12/24/20   Valentina Shaggy, MD  budesonide-formoterol [facility]) 160-4.5 MCG/ACT inhaler 2 puffs twice daily with spacer Patient not taking: Reported on 01/05/2021 12/07/19   Valentina Shaggy, MD  cinacalcet (SENSIPAR) 60 MG tablet Take 90 mg by mouth See admin instructions. Take 1 tablet (90 mg) by mouth in the evening on Mondays, Wednesdays, & Fridays (dialysis) Take 1 tablet (90 mg) by mouth twice daily on Sundays, Tuesdays, Thursdays, Saturdays. (non dialysis) 11/28/18   [provider]  docusate sodium (COLACE) 100 MG capsule Take 1 capsule (100 mg total) by mouth 2 (two) times daily. Patient not taking: Reported on 01/05/2021 02/04/20   Wieters, Hallie C, PA-C  EPINEPHrine 0.3 mg/0.3 mL IJ SOAJ injection Inject 0.3 mLs (0.3 mg total) into the muscle as needed for anaphylaxis. 02/15/20   Valentina Shaggy, MD  fluticasone-salmeterol (ADVAIR [facility])  115-21 MCG/ACT inhaler Inhale two puffs twice  daily Patient not taking: Reported on 01/05/2021 12/24/20   Valentina Shaggy, MD  ondansetron (ZOFRAN) 4 MG tablet Take 1 tablet (4 mg total) by mouth every 6 (six) hours as needed for nausea. Patient not taking: Reported on 01/05/2021 05/22/19   Aline August, MD  oxyCODONE-acetaminophen (PERCOCET) 10-325 MG tablet Take 1 tablet by mouth every 6 (six) hours as needed for pain. Patient not taking: Reported on 01/05/2021 09/05/20 09/05/21  Setzer, Edman Circle, PA-C  polyethylene glycol (MIRALAX / GLYCOLAX) 17 g packet Take 17 g by mouth daily. Patient not taking: Reported on 01/05/2021 02/04/20   Wieters, Madelynn Done C, PA-C    Allergies    Adhesive  [tape], Lisinopril, and Prednisone  Review of Systems   Review of Systems  Constitutional:  Positive for fatigue. Negative for chills and fever.  HENT:  Negative for congestion.   Eyes:  Negative for visual disturbance.  Respiratory:  Positive for cough, chest tightness and shortness of breath.   Cardiovascular:  Negative for chest pain, palpitations and leg swelling.  Gastrointestinal:  Negative for abdominal pain, diarrhea and vomiting.  Genitourinary:  Negative for dysuria.  Skin:  Negative for rash.  Neurological:  Negative for headaches.   Physical Exam Updated Vital Signs BP (!) 195/71   Pulse 96   Temp 98.1 F (36.7 C) (Oral)   Resp 17   SpO2 98%   Physical Exam Vitals and nursing note reviewed.  Constitutional:      Appearance: Normal appearance. She is not diaphoretic.  HENT:     Head: Normocephalic.     Mouth/Throat:     Mouth: Mucous membranes are moist.  Cardiovascular:     Rate and Rhythm: Normal rate.  Pulmonary:     Effort: Pulmonary effort is normal. No respiratory distress.     Breath sounds: Decreased breath sounds and rales present.  Abdominal:     Palpations: Abdomen is soft.     Tenderness: There is no abdominal tenderness.  Musculoskeletal:     Comments: Leg braces  Skin:    General: Skin is warm.   Neurological:     Mental Status: She is alert and oriented to person, place, and time. Mental status is at baseline.  Psychiatric:        Mood and Affect: Mood normal.    ED Results / Procedures / Treatments   Labs (all labs ordered are listed, but only abnormal results are displayed) Labs Reviewed  BASIC METABOLIC PANEL - Abnormal; Notable for the following components:      Result Value   Potassium 3.4 (*)    Chloride 94 (*)    Glucose, Bld 130 (*)    Creatinine, Ser 5.50 (*)    Calcium 8.3 (*)    GFR, Estimated 8 (*)    Anion gap 16 (*)    All other components within normal limits  BRAIN NATRIURETIC PEPTIDE - Abnormal; Notable for the following components:   B Natriuretic Peptide 2,181.4 (*)    All other components within normal limits  TROPONIN I (HIGH SENSITIVITY) - Abnormal; Notable for the following components:   Troponin I (High Sensitivity) 54 (*)    All other components within normal limits  CBC  MAGNESIUM  TROPONIN I (HIGH SENSITIVITY)    EKG EKG Interpretation  Date/Time:  Sunday January 05 2021 05:35:22 EDT Ventricular Rate:  93 PR Interval:  158 QRS Duration: 97 QT Interval:  404 QTC Calculation: 503 R Axis:  48 Text Interpretation: Sinus rhythm Nonspecific T abnormalities, lateral leads Prolonged QT interval Sinus rhythm, QTc prolonged Confirmed by Lavenia Atlas [phone]) on 01/05/2021 8:40:25 AM  Radiology DG Chest 2 View  Result Date: 01/05/2021 CLINICAL DATA:  Chest pain EXAM: CHEST - 2 VIEW COMPARISON:  Radiograph 09/05/2020 FINDINGS: Mild pulmonary vascular congestion and cephalization. Hazy opacity with a basilar gradient with peripheral septal thickening. No focal consolidation. No pneumothorax or visible effusion. Cardiomegaly with calcified and tortuous aorta. No acute osseous or soft tissue abnormality. Degenerative changes are present in the imaged spine and shoulders. High-riding appearance of the right humeral head may reflect some underlying  reticular cuff insufficiency. IMPRESSION: Features most compatible with CHF including cardiomegaly, features of pulmonary edema and vascular congestion. Aortic Atherosclerosis (ICD10-I70.0). Electronically Signed   By: [name] M.D.   On: 01/05/2021 06:41    Procedures Procedures   Medications Ordered in ED Medications - No data to display  ED Course  I have reviewed the triage vital signs and the nursing notes.  Pertinent labs & imaging results that were available during my care of the patient were reviewed by me and considered in my medical decision making (see chart for details).    MDM Rules/Calculators/A&P                          70 year old female presents emergency department with shortness of breath and chest heaviness.  Patient states she is compliant with her dialysis session on Friday 2 days ago, over the weekend started feeling short of breath and coughing up clear sputum, no fever.  She is hypertensive but afebrile here.  Diffuse rales on lung auscultation.  Patient states normal blood pressures around 811 systolic but her blood pressures do get "a little high" right before dialysis day.  She is currently 572 systolic.  Chest x-ray shows pulmonary edema, vascular congestion, findings of CHF.  Blood work shows first troponin of 54, BNP over 2000.  Kidney dysfunction looks appropriate predialysis.  We will give medicine to control high blood pressure, my highest suspicion is a CHF exacerbation.  Plan for admission for diuresis and blood pressure control.  Patients evaluation and results requires admission for further treatment and care. Patient agrees with admission plan, offers no new complaints and is stable/unchanged at time of admit.   Final Clinical Impression(s) / ED Diagnoses Final diagnoses:  None    Rx / DC Orders ED Discharge Orders     None        Lorelle Gibbs, DO 01/05/21 1052

## 2021-01-05 NOTE — Hospital Course (Addendum)
Admitted 01/05/2021  Allergies: Adhesive  [tape], Lisinopril, and Prednisone Pertinent Hx:  has a past medical history of Anemia, Anxiety, Arthritis, Asthma, Cervical disc disease, CHF, Diabetes mellitus, Diverticulitis, ESRD, GERD, GI bleed (03/31/2013), Head injury (07/2017), History of hiatal hernia, Hyperlipidemia, Hypertension, Neuropathy, Osteoporosis, Peripheral vascular disease, and Sleep apnea.   70 y.o. female p/w SOB:  * CHF-  * ESRD -   *   Consults: Nephrology  Meds: acetaminophen, amlodipine, furosemide, labetalol, buspirone, trazodone,  VTE ppx: heparin  IVF: None  Diet: Renal

## 2021-01-05 NOTE — Consult Note (Signed)
[facility] Renal Consultation Note    Indication for Consultation:  Management of ESRD/hemodialysis; anemia, hypertension/volume and secondary hyperparathyroidism  RJJ:OACZY, Destiny Moulds, NP  HPI: Destiny Day is a 70 y.o. female. ESRD on HD MWF at [facility].  Past medical history significant for HTN, HLD, DM, COPD, CHF, OSA, Hx SDH, and PAD w/ R fem-pop.  Of note patient is complaint with prescribed dialysis regimen and completed her last dialysis on 01/03/21, reaching her dry weight.   Destiny Day presents to the ED due to shortness of breath and chest pain.  Reports the CP/SOB woke her up about 4:30AM.  States "it felt the same way it did the last time my congestive heart failure flared up, so I thought I should come in."  No chest pain or shortness of breath prior to this.  Reports cough w/clear sputum for a couple weeks.  States she has been to "all her doctors" and they have not found anything to improve it.  Appetite is good.  Does not feel like she is losing weight.  Meeting or slightly under her dry weight at dialysis.  Blood pressure has been increasing over the last few days.  Denies n/v/d, fever, chills and fatigue.  Admits to weakness, upper abdominal pain and bloating x 1 day.   Pertinent findings in work up include hypertension with SBP>200, CXR with pulmonary edema, BNP 2,184, troponin 54, and K 3.4.  Patient being admitted for further evaluation and management.   Past Medical History:  Diagnosis Date   Abdominal bruit    Anemia    Anxiety    Arthritis    Osteoarthritis   Asthma    Cervical disc disease    "pinced nerve"   CHF (congestive heart failure) ([place])    Complication of anesthesia    " after I got home from my last procedure, I started itching."   Diabetes mellitus    Type II   Diverticulitis    ESRD (end stage renal disease) ([place])    dialysis - M/W/F- [place]   GERD (gastroesophageal reflux disease)    from medications   GI bleed 03/31/2013    Head injury 07/2017   History of hiatal hernia    Hyperlipidemia    Hypertension    Neuropathy    left leg   Osteoporosis    Peripheral vascular disease ([name])    Pneumonia    "very young" and a few years ago   PONV (postoperative nausea and vomiting)    Seasonal allergies    Shortness of breath dyspnea    WIth exertion, when fluid builds   Sleep apnea    can't afford cpap    Past Surgical History:  Procedure Laterality Date   A/V SHUNTOGRAM N/A 09/22/2016   Procedure: A/V Shuntogram - left arm;  Surgeon: Serafina Mitchell, MD;  Location: [place] CV LAB;  Service: Cardiovascular;  Laterality: N/A;   A/V SHUNTOGRAM N/A 03/22/2018   Procedure: A/V SHUNTOGRAM - left arm;  Surgeon: Serafina Mitchell, MD;  Location: [place] [facility];  Service: Cardiovascular;  Laterality: N/A;   A/V SHUNTOGRAM Left 11/17/2018   Procedure: A/V SHUNTOGRAM;  Surgeon: Angelia Mould, MD;  Location: [place] [facility];  Service: Cardiovascular;  Laterality: Left;   ABDOMINAL HYSTERECTOMY  1993`   AMPUTATION Left 09/01/2016   Procedure: LEFT FOOT TRANSMETATARSAL AMPUTATION;  Surgeon: Newt Minion, MD;  Location: [place];  Service: Orthopedics;  Laterality: Left;   AMPUTATION Right 08/11/2017  Procedure: RIGHT GREAT TOE AMPUTATION DIGIT;  Surgeon: Rosetta Posner, MD;  Location: [place];  Service: Vascular;  Laterality: Right;   AMPUTATION Right 12/31/2017   Procedure: RIGHT TRANSMETATARSAL AMPUTATION;  Surgeon: Newt Minion, MD;  Location: [place];  Service: Orthopedics;  Laterality: Right;   AV FISTULA PLACEMENT Left 04/21/2016   Procedure: INSERTION OF ARTERIOVENOUS (AV) GORE-TEX GRAFT ARM LEFT;  Surgeon: Elam Dutch, MD;  Location: [place];  Service: Vascular;  Laterality: Left;   BASCILIC VEIN TRANSPOSITION Left 07/10/2014   Procedure: BASCILIC VEIN TRANSPOSITION;  Surgeon: Angelia Mould, MD;  Location: [place];  Service: Vascular;  Laterality: Left;   [place] Right 11/08/2014    Procedure: FIRST STAGE BASILIC VEIN TRANSPOSITION;  Surgeon: Angelia Mould, MD;  Location: [place];  Service: Vascular;  Laterality: Right;   [place] Right 01/18/2015   Procedure: SECOND STAGE BASILIC VEIN TRANSPOSITION;  Surgeon: Angelia Mould, MD;  Location: [place];  Service: Vascular;  Laterality: Right;   CRANIOTOMY N/A 08/23/2017   Procedure: CRANIOTOMY HEMATOMA EVACUATION SUBDURAL;  Surgeon: Ashok Pall, MD;  Location: [place];  Service: Neurosurgery;  Laterality: N/A;   CRANIOTOMY Left 08/24/2017   Procedure: CRANIOTOMY FOR RECURRENT ACUTE SUBDURAL HEMATOMA;  Surgeon: Ashok Pall, MD;  Location: [place];  Service: Neurosurgery;  Laterality: Left;   ESOPHAGOGASTRODUODENOSCOPY N/A 03/31/2013   Procedure: ESOPHAGOGASTRODUODENOSCOPY (EGD);  Surgeon: Gatha Mayer, MD;  Location: [facility] ENDOSCOPY;  Service: Endoscopy;  Laterality: N/A;   EYE SURGERY     laser surgery   FEMORAL-POPLITEAL BYPASS GRAFT Left 07/08/2016   Procedure: LEFT  FEMORAL-BELOW KNEE POPLITEAL ARTERY BYPASS GRAFT USING 6MM X 80 CM PROPATEN GORETEX GRAFT WITH RINGS.;  Surgeon: Rosetta Posner, MD;  Location: [facility] OR;  Service: Vascular;  Laterality: Left;   FEMORAL-POPLITEAL BYPASS GRAFT Right 08/11/2017   Procedure: RIGHT FEMORAL TO BELOW KNEE POPLITEAL ARTERKY  BYPASS GRAFT USING 6MM RINGED PROPATEN GRAFT;  Surgeon: Rosetta Posner, MD;  Location: [place];  Service: Vascular;  Laterality: Right;   FISTULOGRAM Left 10/29/2014   Procedure: FISTULOGRAM;  Surgeon: Angelia Mould, MD;  Location: [facility] CATH LAB;  Service: Cardiovascular;  Laterality: Left;   GAS/FLUID EXCHANGE Left 12/20/2018   Procedure: AIR GAS EXCHANGE;  Surgeon: Jalene Mullet, MD;  Location: [place];  Service: Ophthalmology;  Laterality: Left;   INSERTION OF DIALYSIS CATHETER N/A 09/05/2020   Procedure: INSERTION OF DIALYSIS CATHETER;  Surgeon: Cherre Robins, MD;  Location: [place];  Service: Vascular;  Laterality: N/A;   KNEE ARTHROSCOPY  Right 05/06/2018   Procedure: RIGHT KNEE ARTHROSCOPY;  Surgeon: Newt Minion, MD;  Location: [place];  Service: Orthopedics;  Laterality: Right;   KNEE ARTHROSCOPY Right 06/10/2018   Procedure: RIGHT KNEE ARTHROSCOPY AND DEBRIDEMENT;  Surgeon: Newt Minion, MD;  Location: [place];  Service: Orthopedics;  Laterality: Right;   LIGATION OF ARTERIOVENOUS  FISTULA Left 04/21/2016   Procedure: LIGATION OF ARTERIOVENOUS  FISTULA LEFT ARM;  Surgeon: Elam Dutch, MD;  Location: [place];  Service: Vascular;  Laterality: Left;   LOWER EXTREMITY ANGIOGRAPHY N/A 04/06/2017   Procedure: Lower Extremity Angiography - Right;  Surgeon: Serafina Mitchell, MD;  Location: [place] [facility];  Service: Cardiovascular;  Laterality: N/A;   MEMBRANE PEEL Left 12/20/2018   Procedure: MEMBRANE PEEL;  Surgeon: Jalene Mullet, MD;  Location: [place];  Service: Ophthalmology;  Laterality: Left;   PATCH ANGIOPLASTY Right 01/18/2015   Procedure: BASILIC VEIN  PATCH ANGIOPLASTY USING VASCUGUARD PATCH;  Surgeon: Angelia Mould, MD;  Location: [place];  Service: Vascular;  Laterality: Right;   PERIPHERAL VASCULAR BALLOON ANGIOPLASTY  09/22/2016   Procedure: Peripheral Vascular Balloon Angioplasty;  Surgeon: Serafina Mitchell, MD;  Location: [place] CV LAB;  Service: Cardiovascular;;  Lt. Fistula   PERIPHERAL VASCULAR BALLOON ANGIOPLASTY Left 03/22/2018   Procedure: PERIPHERAL VASCULAR BALLOON ANGIOPLASTY;  Surgeon: Serafina Mitchell, MD;  Location: [place] [facility];  Service: Cardiovascular;  Laterality: Left;  Arm shunt   PERIPHERAL VASCULAR CATHETERIZATION N/A 06/23/2016   Procedure: Abdominal Aortogram w/Lower Extremity;  Surgeon: Serafina Mitchell, MD;  Location: [place] [facility];  Service: Cardiovascular;  Laterality: N/A;   PERIPHERAL VASCULAR CATHETERIZATION  06/23/2016   Procedure: Peripheral Vascular Intervention;  Surgeon: Serafina Mitchell, MD;  Location: [place] [facility];  Service: Cardiovascular;;  lt common and  external illiac artery   PHOTOCOAGULATION WITH LASER Left 12/20/2018   Procedure: PHOTOCOAGULATION WITH LASER;  Surgeon: Jalene Mullet, MD;  Location: [place];  Service: Ophthalmology;  Laterality: Left;   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   Procedure: REPAIR OF COMPLEX TRACTION RETINAL DETACHMENT;  Surgeon: Jalene Mullet, MD;  Location: [place];  Service: Ophthalmology;  Laterality: Left;   REVISION OF ARTERIOVENOUS GORETEX GRAFT Left 09/05/2020   Procedure: REVISION OF ARTERIOVENOUS GORETEX GRAFT LEFT;  Surgeon: Cherre Robins, MD;  Location: [facility] OR;  Service: Vascular;  Laterality: Left;   Family History  Problem Relation Age of Onset   Other Mother        not sure of cause of death   Diabetes Father    Pancreatic cancer Maternal Grandmother    Diabetes Sister    Diabetes Sister    Colon cancer Neg Hx    Social History:  reports that she quit smoking about 8 years ago. Her smoking use included cigarettes. She has a 14.00 pack-year smoking history. She has never used smokeless tobacco. She reports that she does not drink alcohol and does not use drugs. Allergies  Allergen Reactions   Adhesive  [Tape] Hives   Lisinopril Cough   Prednisone Swelling and Other (See Comments)    Excessive fluid buildup   Prior to Admission medications   Medication Sig Start Date End Date Taking? Authorizing Provider  amLODipine (NORVASC) 10 MG tablet Take 10 mg by mouth at bedtime.   Yes [provider]  aspirin EC 81 MG tablet Take 81 mg by mouth daily.   Yes [provider]  atorvastatin (LIPITOR) 40 MG tablet Take 1 tablet (40 mg total) by mouth daily. 01/05/20  Yes Adrian Prows, MD  azelastine (ASTELIN) 0.1 % nasal spray Place 1 spray into both nostrils 2 (two) times daily. Patient taking differently: Place 1 spray into both nostrils daily as needed for allergies or rhinitis. 12/07/19  Yes Valentina Shaggy, MD  BIDIL 20-37.5 MG tablet TAKE 1 TABLET BY  MOUTH THREE TIMES A DAY 11/05/20  Yes Adrian Prows, MD  busPIRone (BUSPAR) 7.5 MG tablet Take 7.5 mg by mouth 2 (two) times daily. 10/29/19  Yes [provider]  carvedilol (COREG) 12.5 MG tablet Take 12.5 mg by mouth See admin instructions. Take 1 tablet (12.5 mg) by mouth once daily in the evening on Mondays, Wednesdays, & Fridays. (Dialysis) Take 1 tablet (12.5 mg) by mouth twice daily on Sundays, Tuesdays, Thursdays, & Saturdays. (Non Dialysis)   Yes [provider]  EPINEPHrine (AUVI-Q) 0.3 mg/0.3  mL IJ SOAJ injection Inject 0.3 mg into the muscle as needed for anaphylaxis. 12/24/20  Yes Valentina Shaggy, MD  ferric citrate (AURYXIA) 1 GM 210 MG(Fe) tablet Take 210-420 mg by mouth See admin instructions. Take 2 tablets (420 mg) by mouth with each meal & take 1 tablet (210 mg) by mouth with snacks.   Yes [provider]  fluticasone (FLONASE) 50 MCG/ACT nasal spray Place 2 sprays into both nostrils daily as needed for allergies or rhinitis. 12/07/19  Yes Valentina Shaggy, MD  gabapentin (NEURONTIN) 100 MG capsule TAKE 1 CAPSULE (100 MG TOTAL) BY MOUTH AT BEDTIME. WHEN NECESSARY FOR NEUROPATHY PAIN Patient taking differently: Take 100 mg by mouth at bedtime as needed (pain). When necessary for neuropathy pain 12/13/18  Yes Newt Minion, MD  glipiZIDE (GLUCOTROL) 5 MG tablet Take 5 mg by mouth daily before breakfast.   Yes [provider]  lidocaine-prilocaine (EMLA) cream Apply 1 application topically 3 (three) times a week. 1-2 hours prior to dialysis 01/07/18  Yes [provider]  montelukast (SINGULAIR) 10 MG tablet TAKE 1 TABLET BY MOUTH EVERYDAY AT BEDTIME 12/24/20  Yes Valentina Shaggy, MD  multivitamin (RENA-VIT) TABS tablet Take 1 tablet by mouth daily.  01/24/15  Yes [provider]  omeprazole (PRILOSEC) 20 MG capsule Take 20 mg by mouth daily.    Yes [provider]  traZODone (DESYREL) 50 MG tablet Take 50 mg by mouth  at bedtime as needed for sleep. 10/29/19  Yes [provider]  Vitamin D, Ergocalciferol, (DRISDOL) 1.25 MG (50000 UT) CAPS capsule Take 50,000 Units by mouth every Sunday. Takes on Sunday   Yes [provider]  albuterol [facility] HFA) 108 (90 Base) MCG/ACT inhaler 4 puffs every 4-6 hours as needed Patient not taking: Reported on 01/05/2021 12/24/20   Valentina Shaggy, MD  budesonide-formoterol [facility]) 160-4.5 MCG/ACT inhaler 2 puffs twice daily with spacer Patient not taking: Reported on 01/05/2021 12/07/19   Valentina Shaggy, MD  cinacalcet (SENSIPAR) 60 MG tablet Take 90 mg by mouth See admin instructions. Take 1 tablet (90 mg) by mouth in the evening on Mondays, Wednesdays, & Fridays (dialysis) Take 1 tablet (90 mg) by mouth twice daily on Sundays, Tuesdays, Thursdays, Saturdays. (non dialysis) 11/28/18   [provider]  docusate sodium (COLACE) 100 MG capsule Take 1 capsule (100 mg total) by mouth 2 (two) times daily. Patient not taking: Reported on 01/05/2021 02/04/20   Wieters, Hallie C, PA-C  EPINEPHrine 0.3 mg/0.3 mL IJ SOAJ injection Inject 0.3 mLs (0.3 mg total) into the muscle as needed for anaphylaxis. 02/15/20   Valentina Shaggy, MD  fluticasone-salmeterol (ADVAIR [facility]) 3670100259 MCG/ACT inhaler Inhale two puffs twice daily Patient not taking: Reported on 01/05/2021 12/24/20   Valentina Shaggy, MD  ondansetron (ZOFRAN) 4 MG tablet Take 1 tablet (4 mg total) by mouth every 6 (six) hours as needed for nausea. Patient not taking: Reported on 01/05/2021 05/22/19   Aline August, MD  oxyCODONE-acetaminophen (PERCOCET) 10-325 MG tablet Take 1 tablet by mouth every 6 (six) hours as needed for pain. Patient not taking: Reported on 01/05/2021 09/05/20 09/05/21  Setzer, Edman Circle, PA-C  polyethylene glycol (MIRALAX / GLYCOLAX) 17 g packet Take 17 g by mouth daily. Patient not taking: Reported on 01/05/2021 02/04/20   Wieters, Madelynn Done C, PA-C   No current  facility-administered medications for this encounter.   Current Outpatient Medications  Medication Sig Dispense Refill   amLODipine (NORVASC) 10  MG tablet Take 10 mg by mouth at bedtime.     aspirin EC 81 MG tablet Take 81 mg by mouth daily.     atorvastatin (LIPITOR) 40 MG tablet Take 1 tablet (40 mg total) by mouth daily. 90 tablet 3   azelastine (ASTELIN) 0.1 % nasal spray Place 1 spray into both nostrils 2 (two) times daily. (Patient taking differently: Place 1 spray into both nostrils daily as needed for allergies or rhinitis.) 30 mL 5   BIDIL 20-37.5 MG tablet TAKE 1 TABLET BY MOUTH THREE TIMES A DAY 90 tablet 1   busPIRone (BUSPAR) 7.5 MG tablet Take 7.5 mg by mouth 2 (two) times daily.     carvedilol (COREG) 12.5 MG tablet Take 12.5 mg by mouth See admin instructions. Take 1 tablet (12.5 mg) by mouth once daily in the evening on Mondays, Wednesdays, & Fridays. (Dialysis) Take 1 tablet (12.5 mg) by mouth twice daily on Sundays, Tuesdays, Thursdays, & Saturdays. (Non Dialysis)     EPINEPHrine (AUVI-Q) 0.3 mg/0.3 mL IJ SOAJ injection Inject 0.3 mg into the muscle as needed for anaphylaxis. 1 each 1   ferric citrate (AURYXIA) 1 GM 210 MG(Fe) tablet Take 210-420 mg by mouth See admin instructions. Take 2 tablets (420 mg) by mouth with each meal & take 1 tablet (210 mg) by mouth with snacks.     fluticasone (FLONASE) 50 MCG/ACT nasal spray Place 2 sprays into both nostrils daily as needed for allergies or rhinitis. 16 g 5   gabapentin (NEURONTIN) 100 MG capsule TAKE 1 CAPSULE (100 MG TOTAL) BY MOUTH AT BEDTIME. WHEN NECESSARY FOR NEUROPATHY PAIN (Patient taking differently: Take 100 mg by mouth at bedtime as needed (pain). When necessary for neuropathy pain) 30 capsule 3   glipiZIDE (GLUCOTROL) 5 MG tablet Take 5 mg by mouth daily before breakfast.     lidocaine-prilocaine (EMLA) cream Apply 1 application topically 3 (three) times a week. 1-2 hours prior to dialysis  12   montelukast (SINGULAIR)  10 MG tablet TAKE 1 TABLET BY MOUTH EVERYDAY AT BEDTIME 30 tablet 5   multivitamin (RENA-VIT) TABS tablet Take 1 tablet by mouth daily.   5   omeprazole (PRILOSEC) 20 MG capsule Take 20 mg by mouth daily.      traZODone (DESYREL) 50 MG tablet Take 50 mg by mouth at bedtime as needed for sleep.     Vitamin D, Ergocalciferol, (DRISDOL) 1.25 MG (50000 UT) CAPS capsule Take 50,000 Units by mouth every Sunday. Takes on Sunday     albuterol (PROAIR HFA) 108 (90 Base) MCG/ACT inhaler 4 puffs every 4-6 hours as needed (Patient not taking: Reported on 01/05/2021) 18 g 1   budesonide-formoterol (SYMBICORT) 160-4.5 MCG/ACT inhaler 2 puffs twice daily with spacer (Patient not taking: Reported on 01/05/2021) 10.2 g 5   cinacalcet (SENSIPAR) 60 MG tablet Take 90 mg by mouth See admin instructions. Take 1 tablet (90 mg) by mouth in the evening on Mondays, Wednesdays, & Fridays (dialysis) Take 1 tablet (90 mg) by mouth twice daily on Sundays, Tuesdays, Thursdays, Saturdays. (non dialysis)     docusate sodium (COLACE) 100 MG capsule Take 1 capsule (100 mg total) by mouth 2 (two) times daily. (Patient not taking: Reported on 01/05/2021) 30 capsule 0   EPINEPHrine 0.3 mg/0.3 mL IJ SOAJ injection Inject 0.3 mLs (0.3 mg total) into the muscle as needed for anaphylaxis. 2 each 1   fluticasone-salmeterol (ADVAIR HFA) 115-21 MCG/ACT inhaler Inhale two puffs twice daily (Patient not taking: Reported  on 01/05/2021) 1 each 5   ondansetron (ZOFRAN) 4 MG tablet Take 1 tablet (4 mg total) by mouth every 6 (six) hours as needed for nausea. (Patient not taking: Reported on 01/05/2021) 20 tablet 0   oxyCODONE-acetaminophen (PERCOCET) 10-325 MG tablet Take 1 tablet by mouth every 6 (six) hours as needed for pain. (Patient not taking: Reported on 01/05/2021) 20 tablet 0   polyethylene glycol (MIRALAX / GLYCOLAX) 17 g packet Take 17 g by mouth daily. (Patient not taking: Reported on 01/05/2021) 14 each 0   Labs: Basic Metabolic  Panel: Recent Labs  Lab 01/05/21 0616  NA 137  K 3.4*  CL 94*  CO2 27  GLUCOSE 130*  BUN 21  CREATININE 5.50*  CALCIUM 8.3*    CBC: Recent Labs  Lab 01/05/21 0616  WBC 6.9  HGB 12.1  HCT 39.2  MCV 98.5  PLT 268    Studies/Results: DG Chest 2 View  Result Date: 01/05/2021 CLINICAL DATA:  Chest pain EXAM: CHEST - 2 VIEW COMPARISON:  Radiograph 09/05/2020 FINDINGS: Mild pulmonary vascular congestion and cephalization. Hazy opacity with a basilar gradient with peripheral septal thickening. No focal consolidation. No pneumothorax or visible effusion. Cardiomegaly with calcified and tortuous aorta. No acute osseous or soft tissue abnormality. Degenerative changes are present in the imaged spine and shoulders. High-riding appearance of the right humeral head may reflect some underlying reticular cuff insufficiency. IMPRESSION: Features most compatible with CHF including cardiomegaly, features of pulmonary edema and vascular congestion. Aortic Atherosclerosis (ICD10-I70.0). Electronically Signed   By: [name] M.D.   On: 01/05/2021 06:41    ROS: All others negative except those listed in HPI.  Physical Exam: Vitals:   01/05/21 1115 01/05/21 1130 01/05/21 1200 01/05/21 1300  BP: (!) 128/109 (!) 151/111 (!) 158/140 (!) 164/58  Pulse: 83 81 81 83  Resp: 17 12 18  (!) 22  Temp:      TempSrc:      SpO2: 95% 97% 97% 98%     General: WDWN female in NAD Head: NCAT sclera not icteric  Neck: Supple. No lymphadenopathy Lungs: mostly CTAB, faint crackles in bases, breath sounds decreased. Breathing is unlabored on RA. Heart: RRR. No murmur, rubs or gallops appreciated.  Abdomen: soft, nontender, +BS, no guarding, no rebound tenderness Lower extremities:no edema, b/l LE in brace Neuro: AAOx3. Moves all extremities spontaneously. Psych:  Responds to questions appropriately with a normal affect. Dialysis Access: LU AVG +b/t  Dialysis Orders:  [facility] [place]  4hrs, BFR 400, DFR 500,   EDW 64.5kg, 2K/ 3.5Ca, UFP 3  Access: LU AVG  Heparin 2500 unit bolus, 1000 unit intermittent Mircera 30 mcg q2wks - last 67mcg on 4/18 Hectorol 52mcg IV qHD    Assessment/Plan:  Dyspnea - CXR with pulmonary edema.  Plan for HD tonight for volume removal.   Chest pain - trending troponin.  Flat so far.  Likely 2/2 CHF exacerbation/pulmonary edema.   ESRD -  on HD MWF.  Plan for HD tonight off schedule for volume removal and again tomorrow per regular schedule.    Hypertension/volume  - Blood pressure elevated.  Continue home meds.  Does not appear grossly volume overloaded but CXR with pulmonary edema.  Possible weight loss recently, may need to lower dry.  UF as tolerated.   Anemia of CKD - Hgb 12.1. No indication for ESA  Secondary Hyperparathyroidism -  Ca on, on increased Ca bath.  Check phos.  Continue VDRA and binders.   Nutrition -  Renal diet w/fluid restrictions DMT2 - per primary   Jen Mow, PA-C [facility] 01/05/2021, 2:49 PM

## 2021-01-05 NOTE — ED Notes (Signed)
Provider at bedside

## 2021-01-05 NOTE — ED Triage Notes (Signed)
pt reports waking at 0430 this am with SOB associated with chest discomfort. Pt reports she is a CHF and dialysis pt. Pt feels like her CHF is "flaring up"

## 2021-01-06 ENCOUNTER — Encounter (INDEPENDENT_AMBULATORY_CARE_PROVIDER_SITE_OTHER): Payer: Medicare Other | Admitting: Ophthalmology

## 2021-01-06 DIAGNOSIS — I5033 Acute on chronic diastolic (congestive) heart failure: Principal | ICD-10-CM

## 2021-01-06 LAB — RENAL FUNCTION PANEL
Albumin: 2.9 g/dL — ABNORMAL LOW (ref 3.5–5.0)
Anion gap: 13 (ref 5–15)
BUN: 30 mg/dL — ABNORMAL HIGH (ref 8–23)
CO2: 25 mmol/L (ref 22–32)
Calcium: 8 mg/dL — ABNORMAL LOW (ref 8.9–10.3)
Chloride: 95 mmol/L — ABNORMAL LOW (ref 98–111)
Creatinine, Ser: 7.41 mg/dL — ABNORMAL HIGH (ref 0.44–1.00)
GFR, Estimated: 6 mL/min — ABNORMAL LOW (ref >60)
Glucose, Bld: 113 mg/dL — ABNORMAL HIGH (ref 70–99)
Phosphorus: 5.5 mg/dL — ABNORMAL HIGH (ref 2.5–4.6)
Potassium: 3.7 mmol/L (ref 3.5–5.1)
Sodium: 133 mmol/L — ABNORMAL LOW (ref 135–145)

## 2021-01-06 MED ORDER — ACETAMINOPHEN 325 MG PO TABS
ORAL_TABLET | ORAL | Status: AC
  Filled 2021-01-06: qty 2

## 2021-01-06 NOTE — Progress Notes (Signed)
  Date: 01/06/2021  Patient name: Destiny Day  Medical record number: [id number]  Date of birth: Dec 15, 1950   I have seen and evaluated [name] and discussed their care with the Residency Team.  In brief, patient is a 70 year old female with a past medical history of hypertension, hyperlipidemia, type 2 diabetes, asthma/COPD, ESRD on hemodialysis, chronic diastolic heart failure who presented to the ED with worsening shortness of breath x1 day.  Patient states that on the day of her admission she ate a salty bowl of tomato soup with some crackers and approximately 2 to 3 hours later developed new onset shortness of breath and came to the ED for further evaluation.  Patient does note that these types of food do trigger her heart failure.  No chest pain, no palpitations, no lightheadedness, no syncope, no focal weakness, no tingling or numbness, no headache, no nausea or vomiting, no diarrhea, no abdominal pain, no fevers or chills.  Patient states that she has been compliant with her blood pressure medications at home and is also compliant with her hemodialysis sessions.  Today, patient states that her shortness of breath has improved and she denies any other complaints at this time.  PMHx, Fam Hx, and/or Soc Hx : As per resident admit note  Vitals:   01/06/21 1318 01/06/21 1322  BP: 132/75 132/75  Pulse: 98   Resp:    Temp:    SpO2: 99%    General: Awake, alert, oriented x3, NAD CVS: Regular rate and rhythm, systolic murmur noted Lungs: CTA bilaterally Abdomen: Soft, nontender, nondistended, active bowel sounds Extremities: No edema noted, nontender to palpation Psych: Normal mood and affect HEENT: Normocephalic, atraumatic Skin: Warm and dry  Assessment and Plan: I have seen and evaluated the patient as outlined above. I agree with the formulated Assessment and Plan as detailed in the residents' note, with the following changes:   1.  Acute on chronic diastolic heart  failure exacerbation in the setting of hypertensive urgency: -Patient presented to ED with worsening shortness of breath in the setting of dietary indiscretion and was noted to have an elevated BNP in the 2000's and a chest x-ray consistent with pulmonary vascular congestion concerning for an acute on chronic diastolic heart failure exacerbation.  She was also noted to have elevated blood pressures with systolics in the 863O on admission. -Patient was restarted on her home blood pressure medications here and her blood pressures after hemodialysis had normalized. -Patient is taken to hemodialysis today for her normal hemodialysis session and this appears to have improved to pulmonary vascular congestion. -Patient was educated about importance of adherence to a low-salt diet -Patient's troponins remained flat and she has no other signs or symptoms suggestive of an ACS. -No further work-up at this time. -Patient should be stable for DC home today.  Aldine Contes, MD 6/13/20223:53 PM

## 2021-01-06 NOTE — Plan of Care (Signed)

## 2021-01-06 NOTE — Progress Notes (Signed)
[facility] Progress Note   Subjective: Says she ate an entire can of [employer] and "over did it". Seen on HD tolerating well.    Objective Vitals:   01/06/21 0531 01/06/21 0830 01/06/21 0833 01/06/21 0836  BP: (!) 172/69 (!) 183/85 (!) 127/55 (!) 165/89  Pulse: 98 96 96 99  Resp: 17 (!) 21    Temp:  98.7 F (37.1 C)    TempSrc:  Oral    SpO2: 91% 97%    Weight:  71 kg     Physical Exam General: Pleasant elderly female in NAD Heart: S1, S2 RRR Lungs: CTAB Abdomen: S, NT Extremities: No LE edema Dialysis Access: L AVG cannulated    Additional Objective Labs: Basic Metabolic Panel: Recent Labs  Lab 01/05/21 0616  NA 137  K 3.4*  CL 94*  CO2 27  GLUCOSE 130*  BUN 21  CREATININE 5.50*  CALCIUM 8.3*   Liver Function Tests: No results for input(s): AST, ALT, ALKPHOS, BILITOT, PROT, ALBUMIN in the last 168 hours. No results for input(s): LIPASE, AMYLASE in the last 168 hours. CBC: Recent Labs  Lab 01/05/21 0616  WBC 6.9  HGB 12.1  HCT 39.2  MCV 98.5  PLT 268   Blood Culture    Component Value Date/Time   SDES BLOOD RIGHT HAND 05/17/2019 1940   SPECREQUEST  05/17/2019 1940    BOTTLES DRAWN AEROBIC AND ANAEROBIC Blood Culture adequate volume   CULT  05/17/2019 1940    NO GROWTH 5 DAYS Performed at [facility], [place] [address], [place],  [address]    REPTSTATUS 05/22/2019 FINAL 05/17/2019 1940    Cardiac Enzymes: No results for input(s): CKTOTAL, CKMB, CKMBINDEX, TROPONINI in the last 168 hours. CBG: No results for input(s): GLUCAP in the last 168 hours. Iron Studies: No results for input(s): IRON, TIBC, TRANSFERRIN, FERRITIN in the last 72 hours. @lablastinr3 @ Studies/Results: DG Chest 2 View  Result Date: 01/05/2021 CLINICAL DATA:  Chest pain EXAM: CHEST - 2 VIEW COMPARISON:  Radiograph 09/05/2020 FINDINGS: Mild pulmonary vascular congestion and cephalization. Hazy opacity with a basilar gradient with peripheral  septal thickening. No focal consolidation. No pneumothorax or visible effusion. Cardiomegaly with calcified and tortuous aorta. No acute osseous or soft tissue abnormality. Degenerative changes are present in the imaged spine and shoulders. High-riding appearance of the right humeral head may reflect some underlying reticular cuff insufficiency. IMPRESSION: Features most compatible with CHF including cardiomegaly, features of pulmonary edema and vascular congestion. Aortic Atherosclerosis (ICD10-I70.0). Electronically Signed   By: [name] M.D.   On: 01/05/2021 06:41   Medications:   amLODipine  10 mg Oral Daily   aspirin EC  81 mg Oral Daily   atorvastatin  40 mg Oral Daily   busPIRone  7.5 mg Oral BID   [START ON 01/07/2021] carvedilol  12.5 mg Oral 2 times per day on Sun Tue Thu Sat   carvedilol  12.5 mg Oral Once per day on Mon Wed Fri   heparin  5,000 Units Subcutaneous Q8H   isosorbide-hydrALAZINE  1 tablet Oral TID   montelukast  10 mg Oral QHS     Dialysis Orders:  MWF - [facility]  4hrs, BFR 400, DFR 500,  EDW 64.5kg, 2K/ 3.5Ca, UFP 3   Access: LU AVG  Heparin 2500 unit bolus, 1000 unit  IV intermittent Mircera 30 mcg IV q2wks - last 45mcg IVon 4/18 Hectorol 42mcg IV qHD     Assessment/Plan:  Dyspnea - CXR with  pulmonary edema. Serial HD for volume removal.   Chest pain - trending troponin.  Flat so far.  Likely 2/2 CHF exacerbation/pulmonary edema.  ESRD -  on HD MWF. HD today on schedule.   Hypertension/volume  - Blood pressure elevated on admission, coming down with volume removal. .  Continue home meds.    Anemia of CKD - Hgb 12.1. No indication for ESA  Secondary Hyperparathyroidism -  Ca on, on increased Ca bath.  Check phos.  Continue VDRA and binders.  Nutrition - Renal diet w/fluid restrictions DMT2 - per primary    Javin Nong H. Kenan Moodie NP-C 01/06/2021, 8:52 AM  [employer] [phone]

## 2021-01-06 NOTE — Discharge Summary (Signed)
Name: Destiny Day MRN: [id number] DOB: Jun 04, 1951 70 y.o. PCP: Cipriano Mile, NP  Date of Admission: 01/05/2021  5:29 AM Date of Discharge:  Attending Physician: Aldine Contes, MD  Discharge Diagnosis: 1. Acute HFpEF exacerbation  2. Severe asymptomatic HTN  3. ESRD on HD MWF  Discharge Medications: Allergies as of 01/06/2021       Reactions   Adhesive  [tape] Hives   Lisinopril Cough   Prednisone Swelling, Other (See Comments)   Excessive fluid buildup        Medication List     STOP taking these medications    oxyCODONE-acetaminophen 10-325 MG tablet Commonly known as: Percocet       TAKE these medications    amLODipine 10 MG tablet Commonly known as: NORVASC Take 10 mg by mouth at bedtime.   aspirin EC 81 MG tablet Take 81 mg by mouth daily.   atorvastatin 40 MG tablet Commonly known as: LIPITOR Take 1 tablet (40 mg total) by mouth daily.   BiDil 20-37.5 MG tablet Generic drug: isosorbide-hydrALAZINE TAKE 1 TABLET BY MOUTH THREE TIMES A DAY   busPIRone 7.5 MG tablet Commonly known as: BUSPAR Take 7.5 mg by mouth 2 (two) times daily.   carvedilol 12.5 MG tablet Commonly known as: COREG Take 12.5 mg by mouth See admin instructions. Take 1 tablet (12.5 mg) by mouth once daily in the evening on Mondays, Wednesdays, & Fridays. (Dialysis) Take 1 tablet (12.5 mg) by mouth twice daily on Sundays, Tuesdays, Thursdays, & Saturdays. (Non Dialysis)   cinacalcet 60 MG tablet Commonly known as: SENSIPAR Take 90 mg by mouth See admin instructions. Take 1 tablet (90 mg) by mouth in the evening on Mondays, Wednesdays, & Fridays (dialysis) Take 1 tablet (90 mg) by mouth twice daily on Sundays, Tuesdays, Thursdays, Saturdays. (non dialysis)   EPINEPHrine 0.3 mg/0.3 mL Soaj injection Commonly known as: EPI-PEN Inject 0.3 mLs (0.3 mg total) into the muscle as needed for anaphylaxis.   EPINEPHrine 0.3 mg/0.3 mL Soaj injection Commonly known as:  Auvi-Q Inject 0.3 mg into the muscle as needed for anaphylaxis.   ferric citrate 1 GM 210 MG(Fe) tablet Commonly known as: AURYXIA Take 210-420 mg by mouth See admin instructions. Take 2 tablets (420 mg) by mouth with each meal & take 1 tablet (210 mg) by mouth with snacks.   fluticasone 50 MCG/ACT nasal spray Commonly known as: FLONASE Place 2 sprays into both nostrils daily as needed for allergies or rhinitis.   glipiZIDE 5 MG tablet Commonly known as: GLUCOTROL Take 5 mg by mouth daily before breakfast.   lidocaine-prilocaine cream Commonly known as: EMLA Apply 1 application topically 3 (three) times a week. 1-2 hours prior to dialysis   montelukast 10 MG tablet Commonly known as: SINGULAIR TAKE 1 TABLET BY MOUTH EVERYDAY AT BEDTIME   multivitamin Tabs tablet Take 1 tablet by mouth daily.   omeprazole 20 MG capsule Commonly known as: PRILOSEC Take 20 mg by mouth daily.   traZODone 50 MG tablet Commonly known as: DESYREL Take 50 mg by mouth at bedtime as needed for sleep.   Vitamin D (Ergocalciferol) 1.25 MG (50000 UNIT) Caps capsule Commonly known as: DRISDOL Take 50,000 Units by mouth every Sunday. Takes on Sunday       ASK your doctor about these medications    Advair HFA 115-21 MCG/ACT inhaler Generic drug: fluticasone-salmeterol Inhale two puffs twice daily   albuterol 108 (90 Base) MCG/ACT inhaler Commonly known as: ProAir HFA 4 puffs  every 4-6 hours as needed   azelastine 0.1 % nasal spray Commonly known as: ASTELIN Place 1 spray into both nostrils 2 (two) times daily.   budesonide-formoterol 160-4.5 MCG/ACT inhaler Commonly known as: SYMBICORT 2 puffs twice daily with spacer   docusate sodium 100 MG capsule Commonly known as: COLACE Take 1 capsule (100 mg total) by mouth 2 (two) times daily.   gabapentin 100 MG capsule Commonly known as: NEURONTIN TAKE 1 CAPSULE (100 MG TOTAL) BY MOUTH AT BEDTIME. WHEN NECESSARY FOR NEUROPATHY PAIN    ondansetron 4 MG tablet Commonly known as: ZOFRAN Take 1 tablet (4 mg total) by mouth every 6 (six) hours as needed for nausea.   polyethylene glycol 17 g packet Commonly known as: MIRALAX / GLYCOLAX Take 17 g by mouth daily.        Disposition and follow-up:   Destiny Day was discharged from [facility] in Stable condition.  At the hospital follow up visit please address:  1. Acute HFpEF exacerbation. 2/2 increased salt intake. Stable now, asymptomatic from HF standpoint. 3kg above last estimated dry weight (64.5kg), but this should improve with HD on Wednesday. F/u with PCP in 1-2 weeks.  2. Severe asymptomatic HTN. HTN significantly improved after receiving HD session today. Continue home norvasc 10mg  daily, home BiDil 20-37.5mg  TID, and home coreg (1 tablet MWF, 2 tablets TThSat).  3. ESRD on HD MWF. Received HD session today, post-dialysis weight 67.5kg. Continue HD MWF per usual schedule.  2.  Labs / imaging needed at time of follow-up: BMP  3.  Pending labs/ test needing follow-up: None  Follow-up Appointments:  Follow-up Information     Cipriano Mile, NP. Schedule an appointment as soon as possible for a visit in 1 week(s).   Contact information: [place] [address] [phone]                 Hospital Course by problem list: 1. Acute HFpEF exacerbation. 2/2 increased salt intake.  She was in her normal state of health until earlier yesterday when she was hungry and ate a very salty cold tomato soup along with crackers.  Notes that these types of foods tend to trigger her heart failure with.  Presented with shortness of breath which she reports is consistent with her typical flare of heart failure.  BNP 2181 and troponin of 54 and then 49.  Chest x-ray consistent with volume overload.  She received 1 dose of IV Lasix 20 mg in the ED. received hemodialysis today with significant improvement in symptoms.  Stable  today, asymptomatic from HF standpoint, satting greater than 95% on room air. 3kg above last estimated dry weight (64.5kg), but this should improve with HD on Wednesday. F/u with PCP in 1-2 weeks.  2. Severe asymptomatic HTN.  Blood pressures initially with systolics 063K to 160F but with no symptoms.  Received 1 dose of labetalol in the ED.  Resumed her home antihypertensives.  HTN significantly improved after receiving HD session today, now 093A to 355D systolic. Continue home norvasc 10mg  daily, home BiDil 20-37.5mg  TID, and home coreg (1 tablet MWF, 2 tablets TThSat) at discharge.  3. ESRD on HD MWF.  Per nephrology, patient is very compliant with her HD sessions as an outpatient.  Received HD session today, post-dialysis weight 67.5kg. Continue HD MWF per usual schedule.  Discharge Exam:   BP 132/75   Pulse 98   Temp 98.2 F (36.8 C) (Oral)   Resp 17  Wt 67.5 kg   SpO2 99%   BMI 24.76 kg/m  Discharge exam:  General: Elderly female lying in bed on hemodialysis machine, NAD. CV: Normal rate and regular rhythm.  Systolic murmur present. Pulm: Clear to auscultation bilaterally, no adventitious sounds noted, normal work of breathing. Abdomen: Soft, nontender, nondistended, normoactive bowel sounds. MSK: No peripheral edema noted. Skin: Warm and dry. Neuro: AAOx4, no focal deficits noted. Psych: Calm and cooperative.  Pertinent Labs, Studies, and Procedures:  CBC Latest Ref Rng & Units 01/05/2021 09/05/2020 12/08/2019  WBC 4.0 - 10.5 K/uL 6.9 - 6.4  Hemoglobin 12.0 - 15.0 g/dL 12.1 12.9 11.2  Hematocrit 36.0 - 46.0 % 39.2 38.0 32.1(L)  Platelets 150 - 400 K/uL 268 - 269   BMP Latest Ref Rng & Units 01/06/2021 01/05/2021 09/05/2020  Glucose 70 - 99 mg/dL 113(H) 130(H) 192(H)  BUN 8 - 23 mg/dL 30(H) 21 31(H)  Creatinine 0.44 - 1.00 mg/dL 7.41(H) 5.50(H) 5.10(H)  Sodium 135 - 145 mmol/L 133(L) 137 135  Potassium 3.5 - 5.1 mmol/L 3.7 3.4(L) 3.9  Chloride 98 - 111 mmol/L 95(L) 94(L) 96(L)   CO2 22 - 32 mmol/L 25 27 -  Calcium 8.9 - 10.3 mg/dL 8.0(L) 8.3(L) -   BNP    Component Value Date/Time   BNP 2,181.4 (H) 01/05/2021 0648   HS Troponins  54 > 49   Discharge Instructions: Discharge Instructions     (HEART FAILURE PATIENTS) Call MD:  Anytime you have any of the following symptoms: 1) 3 pound weight gain in 24 hours or 5 pounds in 1 week 2) shortness of breath, with or without a dry hacking cough 3) swelling in the hands, feet or stomach 4) if you have to sleep on extra pillows at night in order to breathe.   Complete by: As directed    Call MD for:  difficulty breathing, headache or visual disturbances   Complete by: As directed    Call MD for:  extreme fatigue   Complete by: As directed    Call MD for:  persistant dizziness or light-headedness   Complete by: As directed    Call MD for:  persistant nausea and vomiting   Complete by: As directed    Call MD for:  temperature >100.4   Complete by: As directed    Diet - low sodium heart healthy   Complete by: As directed    Discharge instructions   Complete by: As directed    Ms. Harn, it was a pleasure taking care of you during your time here.  You came in with what we think was an exacerbation of your heart failure.  You were treated here and received 1 session of hemodialysis.  Please take note of the following: 1.  Please schedule a follow-up appointment with your primary care doctor in 1-2 weeks.  2.  Please go to your dialysis session on Wednesday and restart your usual dialysis schedule.   Increase activity slowly   Complete by: As directed        Signed: Virl Axe, MD 01/06/2021, 2:22 PM   Pager: [phone]

## 2021-01-07 ENCOUNTER — Ambulatory Visit (INDEPENDENT_AMBULATORY_CARE_PROVIDER_SITE_OTHER): Payer: Medicare Other | Admitting: *Deleted

## 2021-01-07 ENCOUNTER — Telehealth: Payer: Self-pay | Admitting: Nurse Practitioner

## 2021-01-07 DIAGNOSIS — J309 Allergic rhinitis, unspecified: Secondary | ICD-10-CM | POA: Diagnosis not present

## 2021-01-07 DIAGNOSIS — I159 Secondary hypertension, unspecified: Secondary | ICD-10-CM | POA: Diagnosis not present

## 2021-01-07 DIAGNOSIS — N186 End stage renal disease: Secondary | ICD-10-CM | POA: Diagnosis not present

## 2021-01-07 DIAGNOSIS — Z992 Dependence on renal dialysis: Secondary | ICD-10-CM | POA: Diagnosis not present

## 2021-01-07 MED ORDER — ALBUTEROL SULFATE HFA 108 (90 BASE) MCG/ACT IN AERS: 2.0000 | INHALATION_SPRAY | RESPIRATORY_TRACT | 1 refills | Status: DC | PRN

## 2021-01-07 NOTE — Telephone Encounter (Signed)
Transition of care contact from inpatient facility  Date of Discharge:01/06/21 Date of Contact: 01/05/21 Method of contact: Phone  Attempted to contact patient to discuss transition of care from inpatient admission. Patient did not answer the phone. Message was left on the patient's voicemail with call back number [phone].

## 2021-01-08 DIAGNOSIS — N186 End stage renal disease: Secondary | ICD-10-CM | POA: Diagnosis not present

## 2021-01-08 DIAGNOSIS — Z992 Dependence on renal dialysis: Secondary | ICD-10-CM | POA: Diagnosis not present

## 2021-01-08 DIAGNOSIS — E876 Hypokalemia: Secondary | ICD-10-CM | POA: Diagnosis not present

## 2021-01-08 DIAGNOSIS — N2581 Secondary hyperparathyroidism of renal origin: Secondary | ICD-10-CM | POA: Diagnosis not present

## 2021-01-08 DIAGNOSIS — D631 Anemia in chronic kidney disease: Secondary | ICD-10-CM | POA: Diagnosis not present

## 2021-01-08 DIAGNOSIS — D509 Iron deficiency anemia, unspecified: Secondary | ICD-10-CM | POA: Diagnosis not present

## 2021-01-09 ENCOUNTER — Ambulatory Visit: Payer: Medicare Other | Admitting: Cardiology

## 2021-01-09 ENCOUNTER — Other Ambulatory Visit: Payer: Self-pay

## 2021-01-09 ENCOUNTER — Encounter: Payer: Self-pay | Admitting: Cardiology

## 2021-01-09 ENCOUNTER — Telehealth: Payer: Self-pay

## 2021-01-09 VITALS — BP 143/71 | HR 101 | Temp 97.6°F | Resp 16 | Ht 65.0 in | Wt 146.0 lb

## 2021-01-09 DIAGNOSIS — Z992 Dependence on renal dialysis: Secondary | ICD-10-CM | POA: Diagnosis not present

## 2021-01-09 DIAGNOSIS — I739 Peripheral vascular disease, unspecified: Secondary | ICD-10-CM

## 2021-01-09 DIAGNOSIS — I110 Hypertensive heart disease with heart failure: Secondary | ICD-10-CM

## 2021-01-09 DIAGNOSIS — N186 End stage renal disease: Secondary | ICD-10-CM

## 2021-01-09 DIAGNOSIS — I5032 Chronic diastolic (congestive) heart failure: Secondary | ICD-10-CM

## 2021-01-09 MED ORDER — ISOSORB DINITRATE-HYDRALAZINE 20-37.5 MG PO TABS
1.0000 | ORAL_TABLET | Freq: Three times a day (TID) | ORAL | 3 refills | Status: DC
Start: 2021-01-09 — End: 2021-12-30

## 2021-01-09 NOTE — Telephone Encounter (Signed)
[place] application has been signed, faxed, and sent to be scanned by the [facility].

## 2021-01-09 NOTE — Progress Notes (Signed)
Primary Physician/Referring:  Cipriano Mile, NP  Patient ID: Destiny Day, female    DOB: [birth year], 70 y.o.   MRN: [id number]  Chief Complaint  Patient presents with   Follow-up    1 year   Hypertension   chronic diastolic heart failure   HPI:    Destiny Day  is a 70 y.o. female  Mrs. [place] female with difficult to control hypertension in the past, uncontrolled diabetes mellitus, hyperlipidemia and end-stage renal disease on hemodialysis since 2017, no CAD by angiography surprisingly in 2007, peripheral arterial disease with history of left iliac artery stenting and bilateral femoro-popliteal grafting,  s/p left transmetatarsal amputation on 09/01/2016, and right TMA on 12-31-17 by Dr. Sharol Given due to diabetic non healing ulceration, also has diabetic retinopathy.  She was diagnosed with Covid 19 pneumonia on 05/17/2019, fortunately recovered well.  Patient was seen in the emergency room on 01/05/2021 when she presented with acute shortness of breath and acute on chronic diastolic heart failure and severe hypertension.  She received urgent hemodialysis and was discharged home.  BNP was markedly elevated.  She has recuperated well and now presents for routine follow-up with me.  Admits to not eating right when she had this episode she had salty soup.  She is again gone back to being extremely careful.  Accompanied by her husband.  States that she is feeling well and has not had any further dyspnea and has returned to her activities of daily living without any limitations.  Past Medical History:  Diagnosis Date   Abdominal bruit    Anemia    Anxiety    Arthritis    Osteoarthritis   Asthma    Cervical disc disease    "pinced nerve"   CHF (congestive heart failure) ([place])    Complication of anesthesia    " after I got home from my last procedure, I started itching."   Diabetes mellitus    Type II   Diverticulitis    ESRD (end stage renal disease)  ([place])    dialysis - M/W/F- [place]   GERD (gastroesophageal reflux disease)    from medications   GI bleed 03/31/2013   Head injury 07/2017   History of hiatal hernia    Hyperlipidemia    Hypertension    Neuropathy    left leg   Osteoporosis    Peripheral vascular disease ([place])    Pneumonia    "very young" and a few years ago   PONV (postoperative nausea and vomiting)    Seasonal allergies    Shortness of breath dyspnea    WIth exertion, when fluid builds   Sleep apnea    can't afford cpap    Past Surgical History:  Procedure Laterality Date   A/V SHUNTOGRAM N/A 09/22/2016   Procedure: A/V Shuntogram - left arm;  Surgeon: Serafina Mitchell, MD;  Location: [place] [facility];  Service: Cardiovascular;  Laterality: N/A;   A/V SHUNTOGRAM N/A 03/22/2018   Procedure: A/V SHUNTOGRAM - left arm;  Surgeon: Serafina Mitchell, MD;  Location: [place] [facility];  Service: Cardiovascular;  Laterality: N/A;   A/V SHUNTOGRAM Left 11/17/2018   Procedure: A/V SHUNTOGRAM;  Surgeon: Angelia Mould, MD;  Location: [place] [facility];  Service: Cardiovascular;  Laterality: Left;   ABDOMINAL HYSTERECTOMY  1993`   AMPUTATION Left 09/01/2016   Procedure: LEFT FOOT TRANSMETATARSAL AMPUTATION;  Surgeon: Newt Minion, MD;  Location: [place];  Service: Orthopedics;  Laterality: Left;  AMPUTATION Right 08/11/2017   Procedure: RIGHT GREAT TOE AMPUTATION DIGIT;  Surgeon: Rosetta Posner, MD;  Location: [place];  Service: Vascular;  Laterality: Right;   AMPUTATION Right 12/31/2017   Procedure: RIGHT TRANSMETATARSAL AMPUTATION;  Surgeon: Newt Minion, MD;  Location: [place];  Service: Orthopedics;  Laterality: Right;   AV FISTULA PLACEMENT Left 04/21/2016   Procedure: INSERTION OF ARTERIOVENOUS (AV) GORE-TEX GRAFT ARM LEFT;  Surgeon: Elam Dutch, MD;  Location: [place];  Service: Vascular;  Laterality: Left;   BASCILIC VEIN TRANSPOSITION Left 07/10/2014   Procedure: BASCILIC VEIN TRANSPOSITION;  Surgeon:  Angelia Mould, MD;  Location: [place];  Service: Vascular;  Laterality: Left;   [place] Right 11/08/2014   Procedure: FIRST STAGE BASILIC VEIN TRANSPOSITION;  Surgeon: Angelia Mould, MD;  Location: [place];  Service: Vascular;  Laterality: Right;   [place] Right 01/18/2015   Procedure: SECOND STAGE BASILIC VEIN TRANSPOSITION;  Surgeon: Angelia Mould, MD;  Location: [place];  Service: Vascular;  Laterality: Right;   CRANIOTOMY N/A 08/23/2017   Procedure: CRANIOTOMY HEMATOMA EVACUATION SUBDURAL;  Surgeon: Ashok Pall, MD;  Location: [place];  Service: Neurosurgery;  Laterality: N/A;   CRANIOTOMY Left 08/24/2017   Procedure: CRANIOTOMY FOR RECURRENT ACUTE SUBDURAL HEMATOMA;  Surgeon: Ashok Pall, MD;  Location: [place];  Service: Neurosurgery;  Laterality: Left;   ESOPHAGOGASTRODUODENOSCOPY N/A 03/31/2013   Procedure: ESOPHAGOGASTRODUODENOSCOPY (EGD);  Surgeon: Gatha Mayer, MD;  Location: [facility];  Service: Endoscopy;  Laterality: N/A;   EYE SURGERY     laser surgery   FEMORAL-POPLITEAL BYPASS GRAFT Left 07/08/2016   Procedure: LEFT  FEMORAL-BELOW KNEE POPLITEAL ARTERY BYPASS GRAFT USING 6MM X 80 CM PROPATEN GORETEX GRAFT WITH RINGS.;  Surgeon: Rosetta Posner, MD;  Location: [facility];  Service: Vascular;  Laterality: Left;   FEMORAL-POPLITEAL BYPASS GRAFT Right 08/11/2017   Procedure: RIGHT FEMORAL TO BELOW KNEE POPLITEAL ARTERKY  BYPASS GRAFT USING 6MM RINGED PROPATEN GRAFT;  Surgeon: Rosetta Posner, MD;  Location: [place];  Service: Vascular;  Laterality: Right;   FISTULOGRAM Left 10/29/2014   Procedure: FISTULOGRAM;  Surgeon: Angelia Mould, MD;  Location: [facility];  Service: Cardiovascular;  Laterality: Left;   GAS/FLUID EXCHANGE Left 12/20/2018   Procedure: AIR GAS EXCHANGE;  Surgeon: Jalene Mullet, MD;  Location: [place];  Service: Ophthalmology;  Laterality: Left;   INSERTION OF DIALYSIS CATHETER N/A 09/05/2020   Procedure: INSERTION  OF DIALYSIS CATHETER;  Surgeon: Cherre Robins, MD;  Location: [place];  Service: Vascular;  Laterality: N/A;   KNEE ARTHROSCOPY Right 05/06/2018   Procedure: RIGHT KNEE ARTHROSCOPY;  Surgeon: Newt Minion, MD;  Location: [place];  Service: Orthopedics;  Laterality: Right;   KNEE ARTHROSCOPY Right 06/10/2018   Procedure: RIGHT KNEE ARTHROSCOPY AND DEBRIDEMENT;  Surgeon: Newt Minion, MD;  Location: [place];  Service: Orthopedics;  Laterality: Right;   LIGATION OF ARTERIOVENOUS  FISTULA Left 04/21/2016   Procedure: LIGATION OF ARTERIOVENOUS  FISTULA LEFT ARM;  Surgeon: Elam Dutch, MD;  Location: [place];  Service: Vascular;  Laterality: Left;   LOWER EXTREMITY ANGIOGRAPHY N/A 04/06/2017   Procedure: Lower Extremity Angiography - Right;  Surgeon: Serafina Mitchell, MD;  Location: [place] [facility];  Service: Cardiovascular;  Laterality: N/A;   MEMBRANE PEEL Left 12/20/2018   Procedure: MEMBRANE PEEL;  Surgeon: Jalene Mullet, MD;  Location: [place];  Service: Ophthalmology;  Laterality: Left;   PATCH ANGIOPLASTY Right 01/18/2015  Procedure: BASILIC VEIN PATCH ANGIOPLASTY USING VASCUGUARD PATCH;  Surgeon: Angelia Mould, MD;  Location: [place];  Service: Vascular;  Laterality: Right;   PERIPHERAL VASCULAR BALLOON ANGIOPLASTY  09/22/2016   Procedure: Peripheral Vascular Balloon Angioplasty;  Surgeon: Serafina Mitchell, MD;  Location: [place] [facility];  Service: Cardiovascular;;  Lt. Fistula   PERIPHERAL VASCULAR BALLOON ANGIOPLASTY Left 03/22/2018   Procedure: PERIPHERAL VASCULAR BALLOON ANGIOPLASTY;  Surgeon: Serafina Mitchell, MD;  Location: [place] [facility];  Service: Cardiovascular;  Laterality: Left;  Arm shunt   PERIPHERAL VASCULAR CATHETERIZATION N/A 06/23/2016   Procedure: Abdominal Aortogram w/Lower Extremity;  Surgeon: Serafina Mitchell, MD;  Location: [place] [facility];  Service: Cardiovascular;  Laterality: N/A;   PERIPHERAL VASCULAR CATHETERIZATION  06/23/2016   Procedure: Peripheral  Vascular Intervention;  Surgeon: Serafina Mitchell, MD;  Location: [place] [facility];  Service: Cardiovascular;;  lt common and external illiac artery   PHOTOCOAGULATION WITH LASER Left 12/20/2018   Procedure: PHOTOCOAGULATION WITH LASER;  Surgeon: Jalene Mullet, MD;  Location: [place];  Service: Ophthalmology;  Laterality: Left;   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   Procedure: REPAIR OF COMPLEX TRACTION RETINAL DETACHMENT;  Surgeon: Jalene Mullet, MD;  Location: [place];  Service: Ophthalmology;  Laterality: Left;   REVISION OF ARTERIOVENOUS GORETEX GRAFT Left 09/05/2020   Procedure: REVISION OF ARTERIOVENOUS GORETEX GRAFT LEFT;  Surgeon: Cherre Robins, MD;  Location: [facility] OR;  Service: Vascular;  Laterality: Left;   Family History  Problem Relation Age of Onset   Other Mother        not sure of cause of death   Diabetes Father    Diabetes Sister    Diabetes Sister    Pancreatic cancer Maternal Grandmother    Colon cancer Neg Hx     Social History   Tobacco Use   Smoking status: Former    Packs/day: 0.35    Years: 40.00    Pack years: 14.00    Types: Cigarettes    Quit date: 05/10/2012    Years since quitting: 8.6   Smokeless tobacco: Never  Substance Use Topics   Alcohol use: No   Marital Status: Married  ROS  Review of Systems  Eyes:  Positive for blurred vision.  Cardiovascular:  Negative for dyspnea on exertion, leg swelling and syncope.  Respiratory:  Negative for cough.   Musculoskeletal:  Positive for arthritis. Negative for muscle weakness.  Gastrointestinal:  Negative for melena.  Objective  Blood pressure (!) 143/71, pulse (!) 101, temperature 97.6 F (36.4 C), temperature source Temporal, resp. rate 16, height 5\' 5"  (1.651 m), weight 146 lb (66.2 kg), SpO2 96 %.  Vitals with BMI 01/09/2021 01/06/2021 01/06/2021  Height 5\' 5"  - -  Weight 146 lbs - -  BMI 59.5 - -  Systolic 638 756 433  Diastolic 71 75 75  Pulse 295 - 98     Physical  Exam Constitutional:      General: She is not in acute distress. Neck:     Vascular: Carotid bruit (bilateral) present. No JVD.  Cardiovascular:     Rate and Rhythm: Normal rate and regular rhythm.     Pulses:          Femoral pulses are 2+ on the right side with bruit and 2+ on the left side with bruit.      Popliteal pulses are 1+ on the right side and 1+ on the left side.  Right dorsalis pedis pulse not accessible and left dorsalis pedis pulse not accessible.        Right posterior tibial pulse not accessible and left posterior tibial pulse not accessible.     Heart sounds: No murmur heard.   No gallop.     Comments: Left arm AV fistula for dialysis noted. Pulmonary:     Effort: Pulmonary effort is normal. No accessory muscle usage.     Breath sounds: Normal breath sounds.  Abdominal:     General: Bowel sounds are normal.     Palpations: Abdomen is soft.   Laboratory examination:   Recent Labs    09/05/20 1057 01/05/21 0616 01/06/21 0634  NA 135 137 133*  K 3.9 3.4* 3.7  CL 96* 94* 95*  CO2  --  27 25  GLUCOSE 192* 130* 113*  BUN 31* 21 30*  CREATININE 5.10* 5.50* 7.41*  CALCIUM  --  8.3* 8.0*  GFRNONAA  --  8* 6*   estimated creatinine clearance is 6.4 mL/min (A) (by C-G formula based on SCr of 7.41 mg/dL (H)).  CMP Latest Ref Rng & Units 01/06/2021 01/05/2021 09/05/2020  Glucose 70 - 99 mg/dL 113(H) 130(H) 192(H)  BUN 8 - 23 mg/dL 30(H) 21 31(H)  Creatinine 0.44 - 1.00 mg/dL 7.41(H) 5.50(H) 5.10(H)  Sodium 135 - 145 mmol/L 133(L) 137 135  Potassium 3.5 - 5.1 mmol/L 3.7 3.4(L) 3.9  Chloride 98 - 111 mmol/L 95(L) 94(L) 96(L)  CO2 22 - 32 mmol/L 25 27 -  Calcium 8.9 - 10.3 mg/dL 8.0(L) 8.3(L) -  Total Protein 6.5 - 8.1 g/dL - - -  Total Bilirubin 0.3 - 1.2 mg/dL - - -  Alkaline Phos 38 - 126 U/L - - -  AST 15 - 41 U/L - - -  ALT 0 - 44 U/L - - -   CBC Latest Ref Rng & Units 01/05/2021 09/05/2020 12/08/2019  WBC 4.0 - 10.5 K/uL 6.9 - 6.4  Hemoglobin 12.0 -  15.0 g/dL 12.1 12.9 11.2  Hematocrit 36.0 - 46.0 % 39.2 38.0 32.1(L)  Platelets 150 - 400 K/uL 268 - 269   HEMOGLOBIN A1C Lab Results  Component Value Date   HGBA1C 5.6 06/26/2019   MPG 114.02 06/26/2019   Lipid Panel     Component Value Date/Time   CHOL 204 (H) 01/04/2020 1049   TRIG 111 01/04/2020 1049   HDL 78 01/04/2020 1049   CHOLHDL 2.1 12/01/2010 0455   VLDL 17 12/01/2010 0455   LDLCALC 107 (H) 01/04/2020 1049   LABVLDL 19 01/04/2020 1049    TSH No results for input(s): TSH in the last 8760 hours.  External labs:   12/08/2019: H/H 11.2/32. MCV 92. Platelets 269.  Lipid Panel completed 09/01/2018 Cholesterol, total 204.000 m 09/01/2018 Triglycerides 68.000 09/01/2018 HDL 78 MG/DL 09/01/2018 LDL 112.000 m 09/01/2018  Glucose Random 102.000 06/26/2019 BUN 35.000 06/26/2019 Creatinine, Serum 7.650 06/26/2019  A1C 5.600 06/26/2019  TSH 1.240 micr 09/01/2018  FOBT: Normal 09/16/2018   MicroAlbumin Urine 500.000 01/12/2017 MicroAlbumin/Creat 244.9 MG/ 01/12/2017  Medications and allergies   Allergies  Allergen Reactions   Adhesive  [Tape] Hives   Lisinopril Cough   Prednisone Swelling and Other (See Comments)    Excessive fluid buildup     Current Outpatient Medications  Medication Instructions   albuterol (PROAIR HFA) 108 (90 Base) MCG/ACT inhaler 2 puffs, Inhalation, Every 4 hours PRN   amLODipine (NORVASC) 10 mg, Oral, Daily at bedtime   aspirin EC 81 mg, Oral,  Daily   atorvastatin (LIPITOR) 40 mg, Oral, Daily   azelastine (ASTELIN) 0.1 % nasal spray 1 spray, Each Nare, 2 times daily   busPIRone (BUSPAR) 7.5 mg, Oral, 2 times daily   carvedilol (COREG) 12.5 mg, Oral, See admin instructions, Take 1 tablet (12.5 mg) by mouth once daily in the evening on Mondays, Wednesdays, & Fridays. (Dialysis)<BR>Take 1 tablet (12.5 mg) by mouth twice daily on Sundays, Tuesdays, Thursdays, & Saturdays. (Non Dialysis)   cinacalcet (SENSIPAR) 90 mg, Oral, See admin instructions,  Take 1 tablet (90 mg) by mouth in the evening on Mondays, Wednesdays, & Fridays (dialysis)<BR>Take 1 tablet (90 mg) by mouth twice daily on Sundays, Tuesdays, Thursdays, Saturdays. (non dialysis)   docusate sodium (COLACE) 100 mg, Oral, 2 times daily   EPINEPHrine (AUVI-Q) 0.3 mg, Intramuscular, As needed   ferric citrate (AURYXIA) 210-420 mg, Oral, See admin instructions, Take 2 tablets (420 mg) by mouth with each meal & take 1 tablet (210 mg) by mouth with snacks.    fluticasone (FLONASE) 50 MCG/ACT nasal spray 2 sprays, Each Nare, Daily PRN   fluticasone-salmeterol (ADVAIR HFA) 115-21 MCG/ACT inhaler Inhale two puffs twice daily   gabapentin (NEURONTIN) 100 mg, Oral, Daily at bedtime, When necessary for neuropathy pain   glipiZIDE (GLUCOTROL) 5 mg, Oral, Daily before breakfast   isosorbide-hydrALAZINE (BIDIL) 20-37.5 MG tablet 1 tablet, Oral, 3 times daily, BID on days of dialysis   lidocaine-prilocaine (EMLA) cream 1 application, Topical, 3 times weekly, 1-2 hours prior to dialysis   montelukast (SINGULAIR) 10 MG tablet TAKE 1 TABLET BY MOUTH EVERYDAY AT BEDTIME   multivitamin (RENA-VIT) TABS tablet 1 tablet, Oral, Daily   omeprazole (PRILOSEC) 20 mg, Oral, Daily   ondansetron (ZOFRAN) 4 mg, Oral, Every 6 hours PRN   polyethylene glycol (MIRALAX / GLYCOLAX) 17 g, Oral, Daily   traZODone (DESYREL) 50 mg, Oral, At bedtime PRN   Vitamin D (Ergocalciferol) (DRISDOL) 50,000 Units, Oral, Every Sun, Takes on Sunday     Medications Discontinued During This Encounter  Medication Reason   budesonide-formoterol (SYMBICORT) 160-4.5 MCG/ACT inhaler Error   EPINEPHrine 0.3 mg/0.3 mL IJ SOAJ injection Error   BIDIL 20-37.5 MG tablet Reorder    Radiology:   No results found.  Cardiac Studies:   Echocardiogram 01/23/2018:  Normal LV systolic function, EF 52-77%, grade 2 diastolic dysfunction with elevated left atrial and LV EDP.  Moderate pulmonary hypertension with PA pressure measuring 44  mmHg.  No CAD by angiography surprisingly remotely in 2007.  Lower Extremity Arterial Duplex 01/10/2019: Right: Patent right femoral - below knee popliteal artery bypass graft without evidence of restenosis.  50-74% stenosis noted in the inflow artery.  Left: Patent right femoral - below knee popliteal artery bypass graft without evidence of restenosis.  50-74% stenosis noted in the inflow artery.   EKG  EKG 01/09/2021: Normal sinus rhythm at rate of 91 bpm, left atrial enlargement, normal axis.  Incomplete right bundle branch block.  Nonspecific ST abnormality, no significant change from 01/06/2020.  Assessment     ICD-10-CM   1. Hypertensive heart disease with heart failure (HCC)  I11.0 EKG 12-Lead    isosorbide-hydrALAZINE (BIDIL) 20-37.5 MG tablet    PCV ECHOCARDIOGRAM COMPLETE    2. PAD (peripheral artery disease) (HCC)  I73.9     3. ESRD on dialysis ([place])  N18.6    Z99.2     4. Chronic diastolic heart failure (HCC)  I50.32 isosorbide-hydrALAZINE (BIDIL) 20-37.5 MG tablet    PCV ECHOCARDIOGRAM COMPLETE  Recommendations:   LANEAH LUFT  is a 70 y.o. female  Mrs. [place] female with difficult to control hypertension in the past, uncontrolled diabetes mellitus, hyperlipidemia and end-stage renal disease on hemodialysis since 2017, no CAD by angiography surprisingly in 2007, peripheral arterial disease with history of left iliac artery stenting and bilateral femoro-popliteal grafting,  s/p left transmetatarsal amputation on 09/01/2016, and right TMA on 12-31-17 by Dr. Sharol Given due to diabetic non healing ulceration, also has diabetic retinopathy.  She is here on annual visit, admitted to the hospital on 01/05/2021 when she presented with acute shortness of breath and acute on chronic diastolic heart failure and severe hypertension.  She received urgent hemodialysis and was discharged home.  BNP was markedly elevated.  She has recuperated well and now  presents for routine follow-up with me.  She admits to drinking salty soups prior to this episode of acute decompensated heart failure and hypertension.  She has been very compliant with her diet and medications but this was a fluke event and realizes to be strict again.  She is recuperated well, there is no clinical evidence of heart failure, blood pressure is also well controlled although >140 mmHg, this is probably in good numbers for her.  She is presently on BiDil 3 times a day and takes it twice a day on the days of dialysis.  Prescription resent.  I will repeat echocardiogram to reevaluate LV systolic function.  Unless abnormal I will see him back in a year.  From PAD standpoint she is remained stable.   Adrian Prows, MD, [facility] 01/09/2021, 11:03 AM Office: [phone] Fax: [phone] Pager: [phone]

## 2021-01-10 DIAGNOSIS — N2581 Secondary hyperparathyroidism of renal origin: Secondary | ICD-10-CM | POA: Diagnosis not present

## 2021-01-10 DIAGNOSIS — Z992 Dependence on renal dialysis: Secondary | ICD-10-CM | POA: Diagnosis not present

## 2021-01-10 DIAGNOSIS — N186 End stage renal disease: Secondary | ICD-10-CM | POA: Diagnosis not present

## 2021-01-10 DIAGNOSIS — D631 Anemia in chronic kidney disease: Secondary | ICD-10-CM | POA: Diagnosis not present

## 2021-01-10 DIAGNOSIS — J810 Acute pulmonary edema: Secondary | ICD-10-CM | POA: Diagnosis not present

## 2021-01-10 DIAGNOSIS — I10 Essential (primary) hypertension: Secondary | ICD-10-CM | POA: Diagnosis not present

## 2021-01-10 DIAGNOSIS — R0602 Shortness of breath: Secondary | ICD-10-CM | POA: Diagnosis not present

## 2021-01-10 DIAGNOSIS — E876 Hypokalemia: Secondary | ICD-10-CM | POA: Diagnosis not present

## 2021-01-10 DIAGNOSIS — D509 Iron deficiency anemia, unspecified: Secondary | ICD-10-CM | POA: Diagnosis not present

## 2021-01-13 DIAGNOSIS — E876 Hypokalemia: Secondary | ICD-10-CM | POA: Diagnosis not present

## 2021-01-13 DIAGNOSIS — N2581 Secondary hyperparathyroidism of renal origin: Secondary | ICD-10-CM | POA: Diagnosis not present

## 2021-01-13 DIAGNOSIS — D509 Iron deficiency anemia, unspecified: Secondary | ICD-10-CM | POA: Diagnosis not present

## 2021-01-13 DIAGNOSIS — N186 End stage renal disease: Secondary | ICD-10-CM | POA: Diagnosis not present

## 2021-01-13 DIAGNOSIS — D631 Anemia in chronic kidney disease: Secondary | ICD-10-CM | POA: Diagnosis not present

## 2021-01-13 DIAGNOSIS — Z992 Dependence on renal dialysis: Secondary | ICD-10-CM | POA: Diagnosis not present

## 2021-01-14 NOTE — Telephone Encounter (Signed)
Thank you for taking care of that!  Tyneisha Hegeman, MD [facility]  

## 2021-01-15 DIAGNOSIS — D509 Iron deficiency anemia, unspecified: Secondary | ICD-10-CM | POA: Diagnosis not present

## 2021-01-15 DIAGNOSIS — D631 Anemia in chronic kidney disease: Secondary | ICD-10-CM | POA: Diagnosis not present

## 2021-01-15 DIAGNOSIS — Z992 Dependence on renal dialysis: Secondary | ICD-10-CM | POA: Diagnosis not present

## 2021-01-15 DIAGNOSIS — N2581 Secondary hyperparathyroidism of renal origin: Secondary | ICD-10-CM | POA: Diagnosis not present

## 2021-01-15 DIAGNOSIS — N186 End stage renal disease: Secondary | ICD-10-CM | POA: Diagnosis not present

## 2021-01-15 DIAGNOSIS — E876 Hypokalemia: Secondary | ICD-10-CM | POA: Diagnosis not present

## 2021-01-16 ENCOUNTER — Encounter (INDEPENDENT_AMBULATORY_CARE_PROVIDER_SITE_OTHER): Payer: Self-pay | Admitting: Ophthalmology

## 2021-01-16 ENCOUNTER — Ambulatory Visit (INDEPENDENT_AMBULATORY_CARE_PROVIDER_SITE_OTHER): Payer: Medicare Other | Admitting: Ophthalmology

## 2021-01-16 ENCOUNTER — Other Ambulatory Visit: Payer: Self-pay

## 2021-01-16 DIAGNOSIS — E113512 Type 2 diabetes mellitus with proliferative diabetic retinopathy with macular edema, left eye: Secondary | ICD-10-CM | POA: Diagnosis not present

## 2021-01-16 DIAGNOSIS — E113511 Type 2 diabetes mellitus with proliferative diabetic retinopathy with macular edema, right eye: Secondary | ICD-10-CM

## 2021-01-16 NOTE — Assessment & Plan Note (Signed)
PRP delivered today in a posterior to previous anterior PRP inferonasal and inferiorly

## 2021-01-16 NOTE — Assessment & Plan Note (Signed)
Preretinal hemorrhage and active NVE, will need peripheral PRP scheduled next week

## 2021-01-16 NOTE — Progress Notes (Signed)
01/16/2021     CHIEF COMPLAINT Patient presents for Retina Follow Up (2 week PRP OS/Pt states VA OU stable since last visit. Pt denies FOL, floaters, or ocular pain OU. Karie Mainland: 6.1/LBS:140's at dialysis /)   HISTORY OF PRESENT ILLNESS: Destiny Day is a 70 y.o. female who presents to the clinic today for:   HPI     Retina Follow Up           Diagnosis: Diabetic Retinopathy   Laterality: left eye   Onset: 2 weeks ago   Severity: mild   Duration: 2 weeks   Course: stable   Comments: 2 week PRP OS Pt states VA OU stable since last visit. Pt denies FOL, floaters, or ocular pain OU.  A1C: 6.1 LBS:140's at dialysis         Last edited by Kendra Opitz, COA on 01/16/2021  9:49 AM.      Referring physician: Cipriano Mile, NP [place],  [place] [address]  HISTORICAL INFORMATION:   Selected notes from the MEDICAL RECORD NUMBER    Lab Results  Component Value Date   HGBA1C 5.6 06/26/2019     CURRENT MEDICATIONS: No current outpatient medications on file. (Ophthalmic Drugs)   No current facility-administered medications for this visit. (Ophthalmic Drugs)   Current Outpatient Medications (Other)  Medication Sig   albuterol (PROAIR HFA) 108 (90 Base) MCG/ACT inhaler Inhale 2 puffs into the lungs every 4 (four) hours as needed for wheezing or shortness of breath.   amLODipine (NORVASC) 10 MG tablet Take 10 mg by mouth at bedtime.   aspirin EC 81 MG tablet Take 81 mg by mouth daily.   atorvastatin (LIPITOR) 40 MG tablet Take 1 tablet (40 mg total) by mouth daily.   azelastine (ASTELIN) 0.1 % nasal spray Place 1 spray into both nostrils 2 (two) times daily. (Patient taking differently: Place 1 spray into both nostrils daily as needed for allergies or rhinitis.)   busPIRone (BUSPAR) 7.5 MG tablet Take 7.5 mg by mouth 2 (two) times daily.   carvedilol (COREG) 12.5 MG tablet Take 12.5 mg by mouth See admin instructions. Take 1 tablet (12.5 mg) by mouth once  daily in the evening on Mondays, Wednesdays, & Fridays. (Dialysis) Take 1 tablet (12.5 mg) by mouth twice daily on Sundays, Tuesdays, Thursdays, & Saturdays. (Non Dialysis)   cinacalcet (SENSIPAR) 60 MG tablet Take 90 mg by mouth See admin instructions. Take 1 tablet (90 mg) by mouth in the evening on Mondays, Wednesdays, & Fridays (dialysis) Take 1 tablet (90 mg) by mouth twice daily on Sundays, Tuesdays, Thursdays, Saturdays. (non dialysis)   docusate sodium (COLACE) 100 MG capsule Take 1 capsule (100 mg total) by mouth 2 (two) times daily.   EPINEPHrine (AUVI-Q) 0.3 mg/0.3 mL IJ SOAJ injection Inject 0.3 mg into the muscle as needed for anaphylaxis.   ferric citrate (AURYXIA) 1 GM 210 MG(Fe) tablet Take 210-420 mg by mouth See admin instructions. Take 2 tablets (420 mg) by mouth with each meal & take 1 tablet (210 mg) by mouth with snacks.   fluticasone (FLONASE) 50 MCG/ACT nasal spray Place 2 sprays into both nostrils daily as needed for allergies or rhinitis.   fluticasone-salmeterol (ADVAIR HFA) 115-21 MCG/ACT inhaler Inhale two puffs twice daily (Patient not taking: No sig reported)   gabapentin (NEURONTIN) 100 MG capsule TAKE 1 CAPSULE (100 MG TOTAL) BY MOUTH AT BEDTIME. WHEN NECESSARY FOR NEUROPATHY PAIN (Patient taking differently: Take 100 mg by mouth  at bedtime as needed (pain). When necessary for neuropathy pain)   glipiZIDE (GLUCOTROL) 5 MG tablet Take 5 mg by mouth daily before breakfast.   isosorbide-hydrALAZINE (BIDIL) 20-37.5 MG tablet Take 1 tablet by mouth 3 (three) times daily. BID on days of dialysis   lidocaine-prilocaine (EMLA) cream Apply 1 application topically 3 (three) times a week. 1-2 hours prior to dialysis   montelukast (SINGULAIR) 10 MG tablet TAKE 1 TABLET BY MOUTH EVERYDAY AT BEDTIME   multivitamin (RENA-VIT) TABS tablet Take 1 tablet by mouth daily.    omeprazole (PRILOSEC) 20 MG capsule Take 20 mg by mouth daily.    ondansetron (ZOFRAN) 4 MG tablet Take 1 tablet  (4 mg total) by mouth every 6 (six) hours as needed for nausea.   polyethylene glycol (MIRALAX / GLYCOLAX) 17 g packet Take 17 g by mouth daily.   traZODone (DESYREL) 50 MG tablet Take 50 mg by mouth at bedtime as needed for sleep.   Vitamin D, Ergocalciferol, (DRISDOL) 1.25 MG (50000 UT) CAPS capsule Take 50,000 Units by mouth every Sunday. Takes on Sunday   No current facility-administered medications for this visit. (Other)      REVIEW OF SYSTEMS:    ALLERGIES Allergies  Allergen Reactions   Adhesive  [Tape] Hives   Lisinopril Cough   Prednisone Swelling and Other (See Comments)    Excessive fluid buildup    PAST MEDICAL HISTORY Past Medical History:  Diagnosis Date   Abdominal bruit    Anemia    Anxiety    Arthritis    Osteoarthritis   Asthma    Cervical disc disease    "pinced nerve"   CHF (congestive heart failure) (HCC)    Complication of anesthesia    " after I got home from my last procedure, I started itching."   Diabetes mellitus    Type II   Diverticulitis    ESRD (end stage renal disease) ([place])    dialysis - M/W/F- [place]   GERD (gastroesophageal reflux disease)    from medications   GI bleed 03/31/2013   Head injury 07/2017   History of hiatal hernia    Hyperlipidemia    Hypertension    Neuropathy    left leg   Osteoporosis    Peripheral vascular disease ([place])    Pneumonia    "very young" and a few years ago   PONV (postoperative nausea and vomiting)    Seasonal allergies    Shortness of breath dyspnea    WIth exertion, when fluid builds   Sleep apnea    can't afford cpap    Past Surgical History:  Procedure Laterality Date   A/V SHUNTOGRAM N/A 09/22/2016   Procedure: A/V Shuntogram - left arm;  Surgeon: Serafina Mitchell, MD;  Location: [facility];  Service: Cardiovascular;  Laterality: N/A;   A/V SHUNTOGRAM N/A 03/22/2018   Procedure: A/V SHUNTOGRAM - left arm;  Surgeon: Serafina Mitchell, MD;  Location: [facility];   Service: Cardiovascular;  Laterality: N/A;   A/V SHUNTOGRAM Left 11/17/2018   Procedure: A/V SHUNTOGRAM;  Surgeon: Angelia Mould, MD;  Location: [facility];  Service: Cardiovascular;  Laterality: Left;   ABDOMINAL HYSTERECTOMY  1993`   AMPUTATION Left 09/01/2016   Procedure: LEFT FOOT TRANSMETATARSAL AMPUTATION;  Surgeon: Newt Minion, MD;  Location: [facility];  Service: Orthopedics;  Laterality: Left;   AMPUTATION Right 08/11/2017   Procedure: RIGHT GREAT TOE AMPUTATION DIGIT;  Surgeon: Rosetta Posner,  MD;  Location: [place] [facility];  Service: Vascular;  Laterality: Right;   AMPUTATION Right 12/31/2017   Procedure: RIGHT TRANSMETATARSAL AMPUTATION;  Surgeon: Newt Minion, MD;  Location: [facility] [facility];  Service: Orthopedics;  Laterality: Right;   AV FISTULA PLACEMENT Left 04/21/2016   Procedure: INSERTION OF ARTERIOVENOUS (AV) GORE-TEX GRAFT ARM LEFT;  Surgeon: Elam Dutch, MD;  Location: [facility];  Service: Vascular;  Laterality: Left;   BASCILIC VEIN TRANSPOSITION Left 07/10/2014   Procedure: BASCILIC VEIN TRANSPOSITION;  Surgeon: Angelia Mould, MD;  Location: [facility];  Service: Vascular;  Laterality: Left;   [place] Right 11/08/2014   Procedure: FIRST STAGE BASILIC VEIN TRANSPOSITION;  Surgeon: Angelia Mould, MD;  Location: [facility];  Service: Vascular;  Laterality: Right;   [place] Right 01/18/2015   Procedure: SECOND STAGE BASILIC VEIN TRANSPOSITION;  Surgeon: Angelia Mould, MD;  Location: [facility];  Service: Vascular;  Laterality: Right;   CRANIOTOMY N/A 08/23/2017   Procedure: CRANIOTOMY HEMATOMA EVACUATION SUBDURAL;  Surgeon: Ashok Pall, MD;  Location: [facility];  Service: Neurosurgery;  Laterality: N/A;   CRANIOTOMY Left 08/24/2017   Procedure: CRANIOTOMY FOR RECURRENT ACUTE SUBDURAL HEMATOMA;  Surgeon: Ashok Pall, MD;  Location: [facility];  Service: Neurosurgery;  Laterality: Left;   ESOPHAGOGASTRODUODENOSCOPY N/A 03/31/2013   Procedure:  ESOPHAGOGASTRODUODENOSCOPY (EGD);  Surgeon: Gatha Mayer, MD;  [facility];  Service: Endoscopy;  Laterality: N/A;   EYE SURGERY     laser surgery   FEMORAL-POPLITEAL BYPASS GRAFT Left 07/08/2016   Procedure: LEFT  FEMORAL-BELOW KNEE POPLITEAL ARTERY BYPASS GRAFT USING 6MM X 80 CM PROPATEN GORETEX GRAFT WITH RINGS.;  Surgeon: Rosetta Posner, MD;  Location: [facility];  Service: Vascular;  Laterality: Left;   FEMORAL-POPLITEAL BYPASS GRAFT Right 08/11/2017   Procedure: RIGHT FEMORAL TO BELOW KNEE POPLITEAL ARTERKY  BYPASS GRAFT USING 6MM RINGED PROPATEN GRAFT;  Surgeon: Rosetta Posner, MD;  Location: [place];  Service: Vascular;  Laterality: Right;   FISTULOGRAM Left 10/29/2014   Procedure: FISTULOGRAM;  Surgeon: Angelia Mould, MD;  Location: [facility] CATH LAB;  Service: Cardiovascular;  Laterality: Left;   GAS/FLUID EXCHANGE Left 12/20/2018   Procedure: AIR GAS EXCHANGE;  Surgeon: Jalene Mullet, MD;  Location: [place];  Service: Ophthalmology;  Laterality: Left;   INSERTION OF DIALYSIS CATHETER N/A 09/05/2020   Procedure: INSERTION OF DIALYSIS CATHETER;  Surgeon: Cherre Robins, MD;  Location: [place];  Service: Vascular;  Laterality: N/A;   KNEE ARTHROSCOPY Right 05/06/2018   Procedure: RIGHT KNEE ARTHROSCOPY;  Surgeon: Newt Minion, MD;  Location: [place];  Service: Orthopedics;  Laterality: Right;   KNEE ARTHROSCOPY Right 06/10/2018   Procedure: RIGHT KNEE ARTHROSCOPY AND DEBRIDEMENT;  Surgeon: Newt Minion, MD;  Location: [place];  Service: Orthopedics;  Laterality: Right;   LIGATION OF ARTERIOVENOUS  FISTULA Left 04/21/2016   Procedure: LIGATION OF ARTERIOVENOUS  FISTULA LEFT ARM;  Surgeon: Elam Dutch, MD;  Location: [place];  Service: Vascular;  Laterality: Left;   LOWER EXTREMITY ANGIOGRAPHY N/A 04/06/2017   Procedure: Lower Extremity Angiography - Right;  Surgeon: Serafina Mitchell, MD;  Location: [facility] [facility];  Service: Cardiovascular;  Laterality: N/A;   MEMBRANE PEEL Left  12/20/2018   Procedure: MEMBRANE PEEL;  Surgeon: Jalene Mullet, MD;  Location: [place];  Service: Ophthalmology;  Laterality: Left;   PATCH ANGIOPLASTY Right 01/18/2015   Procedure: BASILIC VEIN PATCH ANGIOPLASTY USING VASCUGUARD PATCH;  Surgeon: Angelia Mould, MD;  Location: [facility];  Service: Vascular;  Laterality: Right;   PERIPHERAL VASCULAR BALLOON ANGIOPLASTY  09/22/2016   Procedure: Peripheral Vascular Balloon Angioplasty;  Surgeon: Serafina Mitchell, MD;  Location: [place] [facility];  Service: Cardiovascular;;  Lt. Fistula   PERIPHERAL VASCULAR BALLOON ANGIOPLASTY Left 03/22/2018   Procedure: PERIPHERAL VASCULAR BALLOON ANGIOPLASTY;  Surgeon: Serafina Mitchell, MD;  Location: [place] [facility];  Service: Cardiovascular;  Laterality: Left;  Arm shunt   PERIPHERAL VASCULAR CATHETERIZATION N/A 06/23/2016   Procedure: Abdominal Aortogram w/Lower Extremity;  Surgeon: Serafina Mitchell, MD;  Location: [place] [facility];  Service: Cardiovascular;  Laterality: N/A;   PERIPHERAL VASCULAR CATHETERIZATION  06/23/2016   Procedure: Peripheral Vascular Intervention;  Surgeon: Serafina Mitchell, MD;  Location: [place] [facility];  Service: Cardiovascular;;  lt common and external illiac artery   PHOTOCOAGULATION WITH LASER Left 12/20/2018   Procedure: PHOTOCOAGULATION WITH LASER;  Surgeon: Jalene Mullet, MD;  Location: [place];  Service: Ophthalmology;  Laterality: Left;   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   Procedure: REPAIR OF COMPLEX TRACTION RETINAL DETACHMENT;  Surgeon: Jalene Mullet, MD;  Location: [place];  Service: Ophthalmology;  Laterality: Left;   REVISION OF ARTERIOVENOUS GORETEX GRAFT Left 09/05/2020   Procedure: REVISION OF ARTERIOVENOUS GORETEX GRAFT LEFT;  Surgeon: Cherre Robins, MD;  Location: [facility];  Service: Vascular;  Laterality: Left;    FAMILY HISTORY Family History  Problem Relation Age of Onset   Other Mother        not sure of cause of death    Diabetes Father    Diabetes Sister    Diabetes Sister    Pancreatic cancer Maternal Grandmother    Colon cancer Neg Hx     SOCIAL HISTORY Social History   Tobacco Use   Smoking status: Former    Packs/Day: 0.35    Years: 40.00    Pack years: 14.00    Types: Cigarettes    Quit date: 05/10/2012    Years since quitting: 8.6   Smokeless tobacco: Never  Vaping Use   Vaping Use: Never used  Substance Use Topics   Alcohol use: No   Drug use: No    Comment: marijuana; quit in early 1980's         OPHTHALMIC EXAM:  Base Eye Exam     Visual Acuity (ETDRS)       Right Left   Dist [place] 20/70 -1 20/60 -1   Dist ph [place] NI NI         Tonometry (Tonopen, 9:53 AM)       Right Left   Pressure 15 16         Pupils       Pupils Dark Light Shape React APD   Right PERRL 3 3 Round Minimal None   Left PERRL 3 3 Round Minimal None         Visual Fields       Left Right    Full    Restrictions  Partial outer inferior nasal deficiency         Extraocular Movement       Right Left    Full Full         Neuro/Psych     Oriented x3: Yes   Mood/Affect: Normal         Dilation     Left eye: 1.0% Mydriacyl, 2.5% Phenylephrine @ 9:53 AM  Slit Lamp and Fundus Exam     External Exam       Right Left   External Normal Normal         Slit Lamp Exam       Right Left   Lids/Lashes  Normal   Conjunctiva/Sclera  White and quiet   Cornea  Clear   Anterior Chamber  Deep and quiet   Iris  Round and reactive   Lens Centered posterior chamber intraocular lens Centered posterior chamber intraocular lens   Anterior Vitreous  Normal, remnant         Fundus Exam       Right Left   Posterior Vitreous  Vitrectomized, by history   Disc  No details through cataract   C/D Ratio  0.3   Macula  CSME at and temporal to the fovea, which suggests that there is still residual cortical vitreous remains   Vessels  Good PRP, active NVE temporally and  inferiorly   Periphery   attached peripherally            IMAGING AND PROCEDURES  Imaging and Procedures for 01/16/21  Panretinal Photocoagulation - OS - Left Eye       Time Out Confirmed correct patient, procedure, site, and patient consented.   Anesthesia Topical anesthesia was used. Anesthetic medications included Proparacaine 0.5%.   Laser Information The type of laser was diode. Color was yellow. The duration in seconds was 0.03. The spot size was 390 microns. Laser power was 220. Total spots was 621.   Post-op The patient tolerated the procedure well. There were no complications. The patient received written and verbal post procedure care education.              ASSESSMENT/PLAN:  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 type 2 diabetes mellitus (HCC) PRP delivered today in a posterior to previous anterior PRP inferonasal and inferiorly  Diabetic macular edema of right eye with proliferative retinopathy associated with type 2 diabetes mellitus (HCC) Preretinal hemorrhage and active NVE, will need peripheral PRP scheduled next week     ICD-10-CM   1. 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 type 2 diabetes mellitus ([place])  J69.6789 Panretinal Photocoagulation - OS - Left Eye    2. Diabetic macular edema of right eye with proliferative retinopathy associated with type 2 diabetes mellitus ([place])  E11.3511       1.  PRP OS delivered today posteriorly where no previous treatment existed.  This should induce further quiescent's of retinopathy 2.  Will need peripheral PRP OD in the near future  3.  Ophthalmic Meds Ordered this visit:  No orders of the defined types were placed in this encounter.      Return in about 1 week (around 01/23/2021) for OD, dilate, PRP.  There are no Patient Instructions on file for this visit.   Explained the diagnoses, plan, and follow up with the  patient and they expressed understanding.  Patient expressed understanding of the importance of proper follow up care.   Clent Demark Aleem Elza M.D. Diseases & Surgery of the Retina and Vitreous Retina & Diabetic [name] 01/16/21     Abbreviations: M myopia (nearsighted); A astigmatism; H hyperopia (farsighted); P presbyopia; Mrx spectacle prescription;  CTL contact lenses; OD right eye; OS left eye; OU both eyes  XT exotropia; ET esotropia; PEK punctate epithelial keratitis; PEE punctate epithelial erosions; DES dry eye syndrome; MGD meibomian gland dysfunction;  ATs artificial tears; PFAT's preservative free artificial tears; [place] nuclear sclerotic cataract; PSC posterior subcapsular cataract; ERM epi-retinal membrane; PVD posterior vitreous detachment; RD retinal detachment; DM diabetes mellitus; DR diabetic retinopathy; NPDR non-proliferative diabetic retinopathy; PDR proliferative diabetic retinopathy; CSME clinically significant macular edema; DME diabetic macular edema; dbh dot blot hemorrhages; CWS cotton wool spot; POAG primary open angle glaucoma; C/D cup-to-disc ratio; HVF humphrey visual field; GVF goldmann visual field; OCT optical coherence tomography; IOP intraocular pressure; BRVO Branch retinal vein occlusion; CRVO central retinal vein occlusion; CRAO central retinal artery occlusion; BRAO branch retinal artery occlusion; RT retinal tear; SB scleral buckle; PPV pars plana vitrectomy; VH Vitreous hemorrhage; PRP panretinal laser photocoagulation; IVK intravitreal kenalog; VMT vitreomacular traction; MH Macular hole;  NVD neovascularization of the disc; NVE neovascularization elsewhere; AREDS age related eye disease study; ARMD age related macular degeneration; POAG primary open angle glaucoma; EBMD epithelial/anterior basement membrane dystrophy; ACIOL anterior chamber intraocular lens; IOL intraocular lens; PCIOL posterior chamber intraocular lens; Phaco/IOL phacoemulsification with intraocular  lens placement; [place] photorefractive keratectomy; LASIK laser assisted in situ keratomileusis; HTN hypertension; DM diabetes mellitus; COPD chronic obstructive pulmonary disease

## 2021-01-17 DIAGNOSIS — N2581 Secondary hyperparathyroidism of renal origin: Secondary | ICD-10-CM | POA: Diagnosis not present

## 2021-01-17 DIAGNOSIS — E876 Hypokalemia: Secondary | ICD-10-CM | POA: Diagnosis not present

## 2021-01-17 DIAGNOSIS — N186 End stage renal disease: Secondary | ICD-10-CM | POA: Diagnosis not present

## 2021-01-17 DIAGNOSIS — D631 Anemia in chronic kidney disease: Secondary | ICD-10-CM | POA: Diagnosis not present

## 2021-01-17 DIAGNOSIS — Z992 Dependence on renal dialysis: Secondary | ICD-10-CM | POA: Diagnosis not present

## 2021-01-17 DIAGNOSIS — D509 Iron deficiency anemia, unspecified: Secondary | ICD-10-CM | POA: Diagnosis not present

## 2021-01-20 DIAGNOSIS — N186 End stage renal disease: Secondary | ICD-10-CM | POA: Diagnosis not present

## 2021-01-20 DIAGNOSIS — E876 Hypokalemia: Secondary | ICD-10-CM | POA: Diagnosis not present

## 2021-01-20 DIAGNOSIS — D631 Anemia in chronic kidney disease: Secondary | ICD-10-CM | POA: Diagnosis not present

## 2021-01-20 DIAGNOSIS — D509 Iron deficiency anemia, unspecified: Secondary | ICD-10-CM | POA: Diagnosis not present

## 2021-01-20 DIAGNOSIS — N2581 Secondary hyperparathyroidism of renal origin: Secondary | ICD-10-CM | POA: Diagnosis not present

## 2021-01-20 DIAGNOSIS — Z992 Dependence on renal dialysis: Secondary | ICD-10-CM | POA: Diagnosis not present

## 2021-01-21 ENCOUNTER — Ambulatory Visit: Payer: Medicare Other

## 2021-01-21 ENCOUNTER — Other Ambulatory Visit: Payer: Self-pay

## 2021-01-21 ENCOUNTER — Ambulatory Visit (INDEPENDENT_AMBULATORY_CARE_PROVIDER_SITE_OTHER): Payer: Medicare Other | Admitting: *Deleted

## 2021-01-21 DIAGNOSIS — I5032 Chronic diastolic (congestive) heart failure: Secondary | ICD-10-CM

## 2021-01-21 DIAGNOSIS — J309 Allergic rhinitis, unspecified: Secondary | ICD-10-CM | POA: Diagnosis not present

## 2021-01-21 DIAGNOSIS — I110 Hypertensive heart disease with heart failure: Secondary | ICD-10-CM | POA: Diagnosis not present

## 2021-01-22 DIAGNOSIS — E876 Hypokalemia: Secondary | ICD-10-CM | POA: Diagnosis not present

## 2021-01-22 DIAGNOSIS — D631 Anemia in chronic kidney disease: Secondary | ICD-10-CM | POA: Diagnosis not present

## 2021-01-22 DIAGNOSIS — N2581 Secondary hyperparathyroidism of renal origin: Secondary | ICD-10-CM | POA: Diagnosis not present

## 2021-01-22 DIAGNOSIS — N186 End stage renal disease: Secondary | ICD-10-CM | POA: Diagnosis not present

## 2021-01-22 DIAGNOSIS — Z992 Dependence on renal dialysis: Secondary | ICD-10-CM | POA: Diagnosis not present

## 2021-01-22 DIAGNOSIS — D509 Iron deficiency anemia, unspecified: Secondary | ICD-10-CM | POA: Diagnosis not present

## 2021-01-23 ENCOUNTER — Other Ambulatory Visit: Payer: Self-pay

## 2021-01-23 ENCOUNTER — Ambulatory Visit (INDEPENDENT_AMBULATORY_CARE_PROVIDER_SITE_OTHER): Payer: Medicare Other | Admitting: Ophthalmology

## 2021-01-23 ENCOUNTER — Encounter (INDEPENDENT_AMBULATORY_CARE_PROVIDER_SITE_OTHER): Payer: Self-pay | Admitting: Ophthalmology

## 2021-01-23 DIAGNOSIS — E113511 Type 2 diabetes mellitus with proliferative diabetic retinopathy with macular edema, right eye: Secondary | ICD-10-CM

## 2021-01-23 NOTE — Assessment & Plan Note (Signed)
PRP delivered today anterior and nasal

## 2021-01-23 NOTE — Progress Notes (Signed)
01/23/2021     CHIEF COMPLAINT Patient presents for Retina Follow Up (1 week prp OD/Pt states [facility] OU stable since last visit. Pt denies FOL, floaters, or ocular pain OU. /Pt states, "I feel like my vision is pretty good. I am not seeing anymore floaters."/A1C:5.6/LBS:135)   HISTORY OF PRESENT ILLNESS: Destiny Day is a 70 y.o. female who presents to the clinic today for:   HPI     Retina Follow Up           Diagnosis: Diabetic Retinopathy   Laterality: right eye   Onset: 1 week ago   Severity: mild   Duration: 1 week   Course: stable   Comments: 1 week prp OD Pt states VA OU stable since last visit. Pt denies FOL, floaters, or ocular pain OU.  Pt states, "I feel like my vision is pretty good. I am not seeing anymore floaters." A1C:5.6 LBS:135       Last edited by Kendra Opitz, COA on 01/23/2021  9:05 AM.      Referring physician: Cipriano Mile, NP [place],  [place] [id number]  HISTORICAL INFORMATION:   Selected notes from the MEDICAL RECORD NUMBER    Lab Results  Component Value Date   HGBA1C 5.6 06/26/2019     CURRENT MEDICATIONS: No current outpatient medications on file. (Ophthalmic Drugs)   No current facility-administered medications for this visit. (Ophthalmic Drugs)   Current Outpatient Medications (Other)  Medication Sig   albuterol (PROAIR HFA) 108 (90 Base) MCG/ACT inhaler Inhale 2 puffs into the lungs every 4 (four) hours as needed for wheezing or shortness of breath.   amLODipine (NORVASC) 10 MG tablet Take 10 mg by mouth at bedtime.   aspirin EC 81 MG tablet Take 81 mg by mouth daily.   atorvastatin (LIPITOR) 40 MG tablet Take 1 tablet (40 mg total) by mouth daily.   azelastine (ASTELIN) 0.1 % nasal spray Place 1 spray into both nostrils 2 (two) times daily. (Patient taking differently: Place 1 spray into both nostrils daily as needed for allergies or rhinitis.)   busPIRone (BUSPAR) 7.5 MG tablet Take 7.5 mg by mouth 2 (two)  times daily.   carvedilol (COREG) 12.5 MG tablet Take 12.5 mg by mouth See admin instructions. Take 1 tablet (12.5 mg) by mouth once daily in the evening on Mondays, Wednesdays, & Fridays. (Dialysis) Take 1 tablet (12.5 mg) by mouth twice daily on Sundays, Tuesdays, Thursdays, & Saturdays. (Non Dialysis)   cinacalcet (SENSIPAR) 60 MG tablet Take 90 mg by mouth See admin instructions. Take 1 tablet (90 mg) by mouth in the evening on Mondays, Wednesdays, & Fridays (dialysis) Take 1 tablet (90 mg) by mouth twice daily on Sundays, Tuesdays, Thursdays, Saturdays. (non dialysis)   docusate sodium (COLACE) 100 MG capsule Take 1 capsule (100 mg total) by mouth 2 (two) times daily.   EPINEPHrine (AUVI-Q) 0.3 mg/0.3 mL IJ SOAJ injection Inject 0.3 mg into the muscle as needed for anaphylaxis.   ferric citrate (AURYXIA) 1 GM 210 MG(Fe) tablet Take 210-420 mg by mouth See admin instructions. Take 2 tablets (420 mg) by mouth with each meal & take 1 tablet (210 mg) by mouth with snacks.   fluticasone (FLONASE) 50 MCG/ACT nasal spray Place 2 sprays into both nostrils daily as needed for allergies or rhinitis.   fluticasone-salmeterol (ADVAIR HFA) 115-21 MCG/ACT inhaler Inhale two puffs twice daily (Patient not taking: No sig reported)   gabapentin (NEURONTIN) 100 MG capsule TAKE  1 CAPSULE (100 MG TOTAL) BY MOUTH AT BEDTIME. WHEN NECESSARY FOR NEUROPATHY PAIN (Patient taking differently: Take 100 mg by mouth at bedtime as needed (pain). When necessary for neuropathy pain)   glipiZIDE (GLUCOTROL) 5 MG tablet Take 5 mg by mouth daily before breakfast.   isosorbide-hydrALAZINE (BIDIL) 20-37.5 MG tablet Take 1 tablet by mouth 3 (three) times daily. BID on days of dialysis   lidocaine-prilocaine (EMLA) cream Apply 1 application topically 3 (three) times a week. 1-2 hours prior to dialysis   montelukast (SINGULAIR) 10 MG tablet TAKE 1 TABLET BY MOUTH EVERYDAY AT BEDTIME   multivitamin (RENA-VIT) TABS tablet Take 1 tablet  by mouth daily.    omeprazole (PRILOSEC) 20 MG capsule Take 20 mg by mouth daily.    ondansetron (ZOFRAN) 4 MG tablet Take 1 tablet (4 mg total) by mouth every 6 (six) hours as needed for nausea.   polyethylene glycol (MIRALAX / GLYCOLAX) 17 g packet Take 17 g by mouth daily.   traZODone (DESYREL) 50 MG tablet Take 50 mg by mouth at bedtime as needed for sleep.   Vitamin D, Ergocalciferol, (DRISDOL) 1.25 MG (50000 UT) CAPS capsule Take 50,000 Units by mouth every Sunday. Takes on Sunday   No current facility-administered medications for this visit. (Other)      REVIEW OF SYSTEMS:    ALLERGIES Allergies  Allergen Reactions   Adhesive  [Tape] Hives   Lisinopril Cough   Prednisone Swelling and Other (See Comments)    Excessive fluid buildup    PAST MEDICAL HISTORY Past Medical History:  Diagnosis Date   Abdominal bruit    Anemia    Anxiety    Arthritis    Osteoarthritis   Asthma    Cervical disc disease    "pinced nerve"   CHF (congestive heart failure) (HCC)    Complication of anesthesia    " after I got home from my last procedure, I started itching."   Diabetes mellitus    Type II   Diverticulitis    ESRD (end stage renal disease) ([place])    dialysis - M/W/F- [place]   GERD (gastroesophageal reflux disease)    from medications   GI bleed 03/31/2013   Head injury 07/2017   History of hiatal hernia    Hyperlipidemia    Hypertension    Neuropathy    left leg   Osteoporosis    Peripheral vascular disease ([place])    Pneumonia    "very young" and a few years ago   PONV (postoperative nausea and vomiting)    Seasonal allergies    Shortness of breath dyspnea    WIth exertion, when fluid builds   Sleep apnea    can't afford cpap    Past Surgical History:  Procedure Laterality Date   A/V SHUNTOGRAM N/A 09/22/2016   Procedure: A/V Shuntogram - left arm;  Surgeon: Serafina Mitchell, MD;  Location: [facility];  Service: Cardiovascular;  Laterality: N/A;   A/V  SHUNTOGRAM N/A 03/22/2018   Procedure: A/V SHUNTOGRAM - left arm;  Surgeon: Serafina Mitchell, MD;  Location: [facility] [place] [facility];  Service: Cardiovascular;  Laterality: N/A;   A/V SHUNTOGRAM Left 11/17/2018   Procedure: A/V SHUNTOGRAM;  Surgeon: Angelia Mould, MD;  Location: [facility];  Service: Cardiovascular;  Laterality: Left;   ABDOMINAL HYSTERECTOMY  1993`   AMPUTATION Left 09/01/2016   Procedure: LEFT FOOT TRANSMETATARSAL AMPUTATION;  Surgeon: Newt Minion, MD;  Location: [facility];  Service:  Orthopedics;  Laterality: Left;   AMPUTATION Right 08/11/2017   Procedure: RIGHT GREAT TOE AMPUTATION DIGIT;  Surgeon: Rosetta Posner, MD;  Location: [place];  Service: Vascular;  Laterality: Right;   AMPUTATION Right 12/31/2017   Procedure: RIGHT TRANSMETATARSAL AMPUTATION;  Surgeon: Newt Minion, MD;  Location: [facility];  Service: Orthopedics;  Laterality: Right;   AV FISTULA PLACEMENT Left 04/21/2016   Procedure: INSERTION OF ARTERIOVENOUS (AV) GORE-TEX GRAFT ARM LEFT;  Surgeon: Elam Dutch, MD;  Location: [facility];  Service: Vascular;  Laterality: Left;   BASCILIC VEIN TRANSPOSITION Left 07/10/2014   Procedure: BASCILIC VEIN TRANSPOSITION;  Surgeon: Angelia Mould, MD;  Location: [facility];  Service: Vascular;  Laterality: Left;   [place] Right 11/08/2014   Procedure: FIRST STAGE BASILIC VEIN TRANSPOSITION;  Surgeon: Angelia Mould, MD;  Location: [facility];  Service: Vascular;  Laterality: Right;   [place] Right 01/18/2015   Procedure: SECOND STAGE BASILIC VEIN TRANSPOSITION;  Surgeon: Angelia Mould, MD;  Location: [facility];  Service: Vascular;  Laterality: Right;   CRANIOTOMY N/A 08/23/2017   Procedure: CRANIOTOMY HEMATOMA EVACUATION SUBDURAL;  Surgeon: Ashok Pall, MD;  Location: [facility];  Service: Neurosurgery;  Laterality: N/A;   CRANIOTOMY Left 08/24/2017   Procedure: CRANIOTOMY FOR RECURRENT ACUTE SUBDURAL HEMATOMA;  Surgeon: Ashok Pall, MD;  Location: [facility];  Service: Neurosurgery;  Laterality: Left;   ESOPHAGOGASTRODUODENOSCOPY N/A 03/31/2013   Procedure: ESOPHAGOGASTRODUODENOSCOPY (EGD);  Surgeon: Gatha Mayer, MD;  [facility];  Service: Endoscopy;  Laterality: N/A;   EYE SURGERY     laser surgery   FEMORAL-POPLITEAL BYPASS GRAFT Left 07/08/2016   Procedure: LEFT  FEMORAL-BELOW KNEE POPLITEAL ARTERY BYPASS GRAFT USING 6MM X 80 CM PROPATEN GORETEX GRAFT WITH RINGS.;  Surgeon: Rosetta Posner, MD;  Location[facility];  Service: Vascular;  Laterality: Left;   FEMORAL-POPLITEAL BYPASS GRAFT Right 08/11/2017   Procedure: RIGHT FEMORAL TO BELOW KNEE POPLITEAL ARTERKY  BYPASS GRAFT USING 6MM RINGED PROPATEN GRAFT;  Surgeon: Rosetta Posner, MD;  Location: [place];  Service: Vascular;  Laterality: Right;   FISTULOGRAM Left 10/29/2014   Procedure: FISTULOGRAM;  Surgeon: Angelia Mould, MD;  Location: [facility] CATH LAB;  Service: Cardiovascular;  Laterality: Left;   GAS/FLUID EXCHANGE Left 12/20/2018   Procedure: AIR GAS EXCHANGE;  Surgeon: Jalene Mullet, MD;  Location: [place];  Service: Ophthalmology;  Laterality: Left;   INSERTION OF DIALYSIS CATHETER N/A 09/05/2020   Procedure: INSERTION OF DIALYSIS CATHETER;  Surgeon: Cherre Robins, MD;  Location: [place];  Service: Vascular;  Laterality: N/A;   KNEE ARTHROSCOPY Right 05/06/2018   Procedure: RIGHT KNEE ARTHROSCOPY;  Surgeon: Newt Minion, MD;  Location: [place];  Service: Orthopedics;  Laterality: Right;   KNEE ARTHROSCOPY Right 06/10/2018   Procedure: RIGHT KNEE ARTHROSCOPY AND DEBRIDEMENT;  Surgeon: Newt Minion, MD;  Location: [place];  Service: Orthopedics;  Laterality: Right;   LIGATION OF ARTERIOVENOUS  FISTULA Left 04/21/2016   Procedure: LIGATION OF ARTERIOVENOUS  FISTULA LEFT ARM;  Surgeon: Elam Dutch, MD;  Location: [place];  Service: Vascular;  Laterality: Left;   LOWER EXTREMITY ANGIOGRAPHY N/A 04/06/2017   Procedure: Lower Extremity Angiography - Right;   Surgeon: Serafina Mitchell, MD;  Location: [place] [facility];  Service: Cardiovascular;  Laterality: N/A;   MEMBRANE PEEL Left 12/20/2018   Procedure: MEMBRANE PEEL;  Surgeon: Jalene Mullet, MD;  Location: [place];  Service: Ophthalmology;  Laterality: Left;  PATCH ANGIOPLASTY Right 01/18/2015   Procedure: BASILIC VEIN PATCH ANGIOPLASTY USING VASCUGUARD PATCH;  Surgeon: Angelia Mould, MD;  Location: [place];  Service: Vascular;  Laterality: Right;   PERIPHERAL VASCULAR BALLOON ANGIOPLASTY  09/22/2016   Procedure: Peripheral Vascular Balloon Angioplasty;  Surgeon: Serafina Mitchell, MD;  Location: [place] [facility];  Service: Cardiovascular;;  Lt. Fistula   PERIPHERAL VASCULAR BALLOON ANGIOPLASTY Left 03/22/2018   Procedure: PERIPHERAL VASCULAR BALLOON ANGIOPLASTY;  Surgeon: Serafina Mitchell, MD;  Location: [place] [facility];  Service: Cardiovascular;  Laterality: Left;  Arm shunt   PERIPHERAL VASCULAR CATHETERIZATION N/A 06/23/2016   Procedure: Abdominal Aortogram w/Lower Extremity;  Surgeon: Serafina Mitchell, MD;  Location: [place] [facility];  Service: Cardiovascular;  Laterality: N/A;   PERIPHERAL VASCULAR CATHETERIZATION  06/23/2016   Procedure: Peripheral Vascular Intervention;  Surgeon: Serafina Mitchell, MD;  Location: [place] [facility];  Service: Cardiovascular;;  lt common and external illiac artery   PHOTOCOAGULATION WITH LASER Left 12/20/2018   Procedure: PHOTOCOAGULATION WITH LASER;  Surgeon: Jalene Mullet, MD;  Location: [place];  Service: Ophthalmology;  Laterality: Left;   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   Procedure: REPAIR OF COMPLEX TRACTION RETINAL DETACHMENT;  Surgeon: Jalene Mullet, MD;  Location: [place];  Service: Ophthalmology;  Laterality: Left;   REVISION OF ARTERIOVENOUS GORETEX GRAFT Left 09/05/2020   Procedure: REVISION OF ARTERIOVENOUS GORETEX GRAFT LEFT;  Surgeon: Cherre Robins, MD;  Location: [facility];  Service: Vascular;  Laterality: Left;     FAMILY HISTORY Family History  Problem Relation Age of Onset   Other Mother        not sure of cause of death   Diabetes Father    Diabetes Sister    Diabetes Sister    Pancreatic cancer Maternal Grandmother    Colon cancer Neg Hx     SOCIAL HISTORY Social History   Tobacco Use   Smoking status: Former    Packs/day: 0.35    Years: 40.00    Pack years: 14.00    Types: Cigarettes    Quit date: 05/10/2012    Years since quitting: 8.7   Smokeless tobacco: Never  Vaping Use   Vaping Use: Never used  Substance Use Topics   Alcohol use: No   Drug use: No    Comment: marijuana; quit in early 1980's         OPHTHALMIC EXAM:  Base Eye Exam     Visual Acuity (ETDRS)       Right Left   Dist cc 20/80 -1 20/100   Dist ph cc NI NI         Tonometry (Tonopen, 9:09 AM)       Right Left   Pressure 16 18         Pupils       Pupils Dark Light Shape React APD   Right PERRL 3 3 Round Minimal None   Left PERRL 3 3 Round Minimal None         Visual Fields       Left Right    Full    Restrictions  Partial outer inferior nasal deficiency         Neuro/Psych     Oriented x3: Yes   Mood/Affect: Normal         Dilation     Right eye: 1.0% Mydriacyl, 2.5% Phenylephrine @ 9:09 AM  Slit Lamp and Fundus Exam     External Exam       Right Left   External Normal Normal         Slit Lamp Exam       Right Left   Lids/Lashes Normal Normal   Conjunctiva/Sclera White and quiet White and quiet   Cornea Clear Clear   Anterior Chamber Deep and quiet Deep and quiet   Iris Round and reactive Round and reactive   Lens Centered posterior chamber intraocular lens Centered posterior chamber intraocular lens   Anterior Vitreous Normal Normal, remnant         Fundus Exam       Right Left   Posterior Vitreous Pre-retinal hemorrhage    Disc Neovascularization    C/D Ratio 0.5    Macula Microaneurysms    Vessels PDR-active with NVE  inferior to nerve NVE temporal portion of macula    Periphery  moderate scatter             IMAGING AND PROCEDURES  Imaging and Procedures for 01/23/21  Panretinal Photocoagulation - OD - Right Eye       Time Out Confirmed correct patient, procedure, site, and patient consented.   Anesthesia Topical anesthesia was used. Anesthetic medications included Proparacaine 0.5%.   Laser Information The type of laser was diode. Color was yellow. The duration in seconds was 0.03. The spot size was 390 microns. Laser power was 280. Total spots was 660.   Post-op The patient tolerated the procedure well. There were no complications. The patient received written and verbal post procedure care education.   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  PDR OD active, with neovascular tissue.  Less preretinal hemorrhage overall.  Nonetheless additional PRP delivered today inferior and nasal to the anterior regions of the retina  2.  Follow Dr. Carolynn Sayers as scheduled in 1 month  3.  Follow-up here in 2 months  Ophthalmic Meds Ordered this visit:  No orders of the defined types were placed in this encounter.      Return in about 9 weeks (around 03/27/2021) for DILATE OU, COLOR FP.  There are no Patient Instructions on file for this visit.   Explained the diagnoses, plan, and follow up with the patient and they expressed understanding.  Patient expressed understanding of the importance of proper follow up care.   Clent Demark Donzella Carrol M.D. Diseases & Surgery of the Retina and Vitreous Retina & Diabetic  [place] 01/23/21     Abbreviations: M myopia (nearsighted); A astigmatism; H hyperopia (farsighted); P presbyopia; Mrx spectacle prescription;  CTL contact lenses; OD right eye; OS left eye; OU both eyes  XT exotropia; ET esotropia; PEK punctate epithelial keratitis; PEE punctate epithelial erosions; DES dry eye syndrome; MGD meibomian gland dysfunction; ATs artificial tears; PFAT's preservative free artificial tears; [place] nuclear sclerotic cataract; PSC posterior subcapsular cataract; ERM epi-retinal membrane; PVD posterior vitreous detachment; RD retinal detachment; DM diabetes mellitus; DR diabetic retinopathy; NPDR non-proliferative diabetic retinopathy; PDR proliferative diabetic retinopathy; CSME clinically significant macular edema;  DME diabetic macular edema; dbh dot blot hemorrhages; CWS cotton wool spot; POAG primary open angle glaucoma; C/D cup-to-disc ratio; HVF humphrey visual field; GVF goldmann visual field; OCT optical coherence tomography; IOP intraocular pressure; BRVO Branch retinal vein occlusion; CRVO central retinal vein occlusion; CRAO central retinal artery occlusion; BRAO branch retinal artery occlusion; RT retinal tear; SB scleral buckle; PPV pars plana vitrectomy; VH Vitreous hemorrhage; PRP panretinal laser photocoagulation; IVK intravitreal kenalog; VMT vitreomacular traction; MH Macular hole;  NVD neovascularization of the disc; NVE neovascularization elsewhere; AREDS age related eye disease study; ARMD age related macular degeneration; POAG primary open angle glaucoma; EBMD epithelial/anterior basement membrane dystrophy; ACIOL anterior chamber intraocular lens; IOL intraocular lens; PCIOL posterior chamber intraocular lens; Phaco/IOL phacoemulsification with intraocular lens placement; [place] photorefractive keratectomy; LASIK laser assisted in situ keratomileusis; HTN hypertension; DM diabetes mellitus; COPD chronic obstructive pulmonary disease

## 2021-01-24 DIAGNOSIS — D631 Anemia in chronic kidney disease: Secondary | ICD-10-CM | POA: Diagnosis not present

## 2021-01-24 DIAGNOSIS — N186 End stage renal disease: Secondary | ICD-10-CM | POA: Diagnosis not present

## 2021-01-24 DIAGNOSIS — Z992 Dependence on renal dialysis: Secondary | ICD-10-CM | POA: Diagnosis not present

## 2021-01-24 DIAGNOSIS — Z23 Encounter for immunization: Secondary | ICD-10-CM | POA: Diagnosis not present

## 2021-01-24 DIAGNOSIS — N2581 Secondary hyperparathyroidism of renal origin: Secondary | ICD-10-CM | POA: Diagnosis not present

## 2021-01-24 DIAGNOSIS — E876 Hypokalemia: Secondary | ICD-10-CM | POA: Diagnosis not present

## 2021-01-24 DIAGNOSIS — I159 Secondary hypertension, unspecified: Secondary | ICD-10-CM | POA: Diagnosis not present

## 2021-01-27 DIAGNOSIS — E876 Hypokalemia: Secondary | ICD-10-CM | POA: Diagnosis not present

## 2021-01-27 DIAGNOSIS — Z992 Dependence on renal dialysis: Secondary | ICD-10-CM | POA: Diagnosis not present

## 2021-01-27 DIAGNOSIS — D631 Anemia in chronic kidney disease: Secondary | ICD-10-CM | POA: Diagnosis not present

## 2021-01-27 DIAGNOSIS — N2581 Secondary hyperparathyroidism of renal origin: Secondary | ICD-10-CM | POA: Diagnosis not present

## 2021-01-27 DIAGNOSIS — N186 End stage renal disease: Secondary | ICD-10-CM | POA: Diagnosis not present

## 2021-01-27 DIAGNOSIS — Z23 Encounter for immunization: Secondary | ICD-10-CM | POA: Diagnosis not present

## 2021-01-28 NOTE — Progress Notes (Signed)
Normal heart function. Moderate LVH.  LVH = Left ventricular hypertrophy: thickening of heart muscle probably due to hypertension. Can happen in valve leaking issues, obesity, diabetes.  Sometimes atheletes or if you exercise daily then it is not abnormal.  No change from prior echo.     Echocardiogram 01/21/2021: Normal LV systolic function with EF 55%. Left ventricle cavity is small. Moderate concentric hypertrophy of the left ventricle. Normal global wall motion. Doppler evidence of grade I (impaired) diastolic dysfunction, elevated LAP. Calculated EF 55%. No mitral valve regurgitation. Mild mitral valve leaflet calcification. No evidence of mitral valve stenosis.

## 2021-01-29 DIAGNOSIS — E1122 Type 2 diabetes mellitus with diabetic chronic kidney disease: Secondary | ICD-10-CM | POA: Diagnosis not present

## 2021-01-29 DIAGNOSIS — Z23 Encounter for immunization: Secondary | ICD-10-CM | POA: Diagnosis not present

## 2021-01-29 DIAGNOSIS — E876 Hypokalemia: Secondary | ICD-10-CM | POA: Diagnosis not present

## 2021-01-29 DIAGNOSIS — N186 End stage renal disease: Secondary | ICD-10-CM | POA: Diagnosis not present

## 2021-01-29 DIAGNOSIS — N2581 Secondary hyperparathyroidism of renal origin: Secondary | ICD-10-CM | POA: Diagnosis not present

## 2021-01-29 DIAGNOSIS — Z992 Dependence on renal dialysis: Secondary | ICD-10-CM | POA: Diagnosis not present

## 2021-01-29 DIAGNOSIS — D631 Anemia in chronic kidney disease: Secondary | ICD-10-CM | POA: Diagnosis not present

## 2021-01-31 DIAGNOSIS — N186 End stage renal disease: Secondary | ICD-10-CM | POA: Diagnosis not present

## 2021-01-31 DIAGNOSIS — D631 Anemia in chronic kidney disease: Secondary | ICD-10-CM | POA: Diagnosis not present

## 2021-01-31 DIAGNOSIS — N2581 Secondary hyperparathyroidism of renal origin: Secondary | ICD-10-CM | POA: Diagnosis not present

## 2021-01-31 DIAGNOSIS — E876 Hypokalemia: Secondary | ICD-10-CM | POA: Diagnosis not present

## 2021-01-31 DIAGNOSIS — I509 Heart failure, unspecified: Secondary | ICD-10-CM | POA: Diagnosis not present

## 2021-01-31 DIAGNOSIS — Z992 Dependence on renal dialysis: Secondary | ICD-10-CM | POA: Diagnosis not present

## 2021-01-31 DIAGNOSIS — Z23 Encounter for immunization: Secondary | ICD-10-CM | POA: Diagnosis not present

## 2021-02-03 DIAGNOSIS — Z23 Encounter for immunization: Secondary | ICD-10-CM | POA: Diagnosis not present

## 2021-02-03 DIAGNOSIS — N2581 Secondary hyperparathyroidism of renal origin: Secondary | ICD-10-CM | POA: Diagnosis not present

## 2021-02-03 DIAGNOSIS — Z992 Dependence on renal dialysis: Secondary | ICD-10-CM | POA: Diagnosis not present

## 2021-02-03 DIAGNOSIS — D631 Anemia in chronic kidney disease: Secondary | ICD-10-CM | POA: Diagnosis not present

## 2021-02-03 DIAGNOSIS — N186 End stage renal disease: Secondary | ICD-10-CM | POA: Diagnosis not present

## 2021-02-03 DIAGNOSIS — E876 Hypokalemia: Secondary | ICD-10-CM | POA: Diagnosis not present

## 2021-02-04 NOTE — Progress Notes (Signed)
Called and spoke with patient regarding her echocardiogram results.

## 2021-02-05 DIAGNOSIS — N2581 Secondary hyperparathyroidism of renal origin: Secondary | ICD-10-CM | POA: Diagnosis not present

## 2021-02-05 DIAGNOSIS — D631 Anemia in chronic kidney disease: Secondary | ICD-10-CM | POA: Diagnosis not present

## 2021-02-05 DIAGNOSIS — E876 Hypokalemia: Secondary | ICD-10-CM | POA: Diagnosis not present

## 2021-02-05 DIAGNOSIS — Z992 Dependence on renal dialysis: Secondary | ICD-10-CM | POA: Diagnosis not present

## 2021-02-05 DIAGNOSIS — N186 End stage renal disease: Secondary | ICD-10-CM | POA: Diagnosis not present

## 2021-02-05 DIAGNOSIS — Z23 Encounter for immunization: Secondary | ICD-10-CM | POA: Diagnosis not present

## 2021-02-07 DIAGNOSIS — N186 End stage renal disease: Secondary | ICD-10-CM | POA: Diagnosis not present

## 2021-02-07 DIAGNOSIS — Z992 Dependence on renal dialysis: Secondary | ICD-10-CM | POA: Diagnosis not present

## 2021-02-07 DIAGNOSIS — N2581 Secondary hyperparathyroidism of renal origin: Secondary | ICD-10-CM | POA: Diagnosis not present

## 2021-02-07 DIAGNOSIS — D631 Anemia in chronic kidney disease: Secondary | ICD-10-CM | POA: Diagnosis not present

## 2021-02-07 DIAGNOSIS — E876 Hypokalemia: Secondary | ICD-10-CM | POA: Diagnosis not present

## 2021-02-07 DIAGNOSIS — Z23 Encounter for immunization: Secondary | ICD-10-CM | POA: Diagnosis not present

## 2021-02-10 DIAGNOSIS — E876 Hypokalemia: Secondary | ICD-10-CM | POA: Diagnosis not present

## 2021-02-10 DIAGNOSIS — D631 Anemia in chronic kidney disease: Secondary | ICD-10-CM | POA: Diagnosis not present

## 2021-02-10 DIAGNOSIS — Z992 Dependence on renal dialysis: Secondary | ICD-10-CM | POA: Diagnosis not present

## 2021-02-10 DIAGNOSIS — N2581 Secondary hyperparathyroidism of renal origin: Secondary | ICD-10-CM | POA: Diagnosis not present

## 2021-02-10 DIAGNOSIS — N186 End stage renal disease: Secondary | ICD-10-CM | POA: Diagnosis not present

## 2021-02-10 DIAGNOSIS — Z23 Encounter for immunization: Secondary | ICD-10-CM | POA: Diagnosis not present

## 2021-02-11 ENCOUNTER — Ambulatory Visit (INDEPENDENT_AMBULATORY_CARE_PROVIDER_SITE_OTHER): Payer: Medicare Other

## 2021-02-11 DIAGNOSIS — J309 Allergic rhinitis, unspecified: Secondary | ICD-10-CM

## 2021-02-12 ENCOUNTER — Encounter: Payer: Self-pay | Admitting: *Deleted

## 2021-02-12 DIAGNOSIS — Z23 Encounter for immunization: Secondary | ICD-10-CM | POA: Diagnosis not present

## 2021-02-12 DIAGNOSIS — N2581 Secondary hyperparathyroidism of renal origin: Secondary | ICD-10-CM | POA: Diagnosis not present

## 2021-02-12 DIAGNOSIS — E876 Hypokalemia: Secondary | ICD-10-CM | POA: Diagnosis not present

## 2021-02-12 DIAGNOSIS — Z992 Dependence on renal dialysis: Secondary | ICD-10-CM | POA: Diagnosis not present

## 2021-02-12 DIAGNOSIS — N186 End stage renal disease: Secondary | ICD-10-CM | POA: Diagnosis not present

## 2021-02-12 DIAGNOSIS — D631 Anemia in chronic kidney disease: Secondary | ICD-10-CM | POA: Diagnosis not present

## 2021-02-14 DIAGNOSIS — Z23 Encounter for immunization: Secondary | ICD-10-CM | POA: Diagnosis not present

## 2021-02-14 DIAGNOSIS — E876 Hypokalemia: Secondary | ICD-10-CM | POA: Diagnosis not present

## 2021-02-14 DIAGNOSIS — Z992 Dependence on renal dialysis: Secondary | ICD-10-CM | POA: Diagnosis not present

## 2021-02-14 DIAGNOSIS — D631 Anemia in chronic kidney disease: Secondary | ICD-10-CM | POA: Diagnosis not present

## 2021-02-14 DIAGNOSIS — N186 End stage renal disease: Secondary | ICD-10-CM | POA: Diagnosis not present

## 2021-02-14 DIAGNOSIS — N2581 Secondary hyperparathyroidism of renal origin: Secondary | ICD-10-CM | POA: Diagnosis not present

## 2021-02-17 DIAGNOSIS — Z23 Encounter for immunization: Secondary | ICD-10-CM | POA: Diagnosis not present

## 2021-02-17 DIAGNOSIS — N2581 Secondary hyperparathyroidism of renal origin: Secondary | ICD-10-CM | POA: Diagnosis not present

## 2021-02-17 DIAGNOSIS — Z992 Dependence on renal dialysis: Secondary | ICD-10-CM | POA: Diagnosis not present

## 2021-02-17 DIAGNOSIS — N186 End stage renal disease: Secondary | ICD-10-CM | POA: Diagnosis not present

## 2021-02-17 DIAGNOSIS — E876 Hypokalemia: Secondary | ICD-10-CM | POA: Diagnosis not present

## 2021-02-17 DIAGNOSIS — D631 Anemia in chronic kidney disease: Secondary | ICD-10-CM | POA: Diagnosis not present

## 2021-02-19 DIAGNOSIS — Z23 Encounter for immunization: Secondary | ICD-10-CM | POA: Diagnosis not present

## 2021-02-19 DIAGNOSIS — N2581 Secondary hyperparathyroidism of renal origin: Secondary | ICD-10-CM | POA: Diagnosis not present

## 2021-02-19 DIAGNOSIS — N186 End stage renal disease: Secondary | ICD-10-CM | POA: Diagnosis not present

## 2021-02-19 DIAGNOSIS — Z992 Dependence on renal dialysis: Secondary | ICD-10-CM | POA: Diagnosis not present

## 2021-02-19 DIAGNOSIS — E876 Hypokalemia: Secondary | ICD-10-CM | POA: Diagnosis not present

## 2021-02-19 DIAGNOSIS — D631 Anemia in chronic kidney disease: Secondary | ICD-10-CM | POA: Diagnosis not present

## 2021-02-21 DIAGNOSIS — Z23 Encounter for immunization: Secondary | ICD-10-CM | POA: Diagnosis not present

## 2021-02-21 DIAGNOSIS — N186 End stage renal disease: Secondary | ICD-10-CM | POA: Diagnosis not present

## 2021-02-21 DIAGNOSIS — D631 Anemia in chronic kidney disease: Secondary | ICD-10-CM | POA: Diagnosis not present

## 2021-02-21 DIAGNOSIS — N2581 Secondary hyperparathyroidism of renal origin: Secondary | ICD-10-CM | POA: Diagnosis not present

## 2021-02-21 DIAGNOSIS — E876 Hypokalemia: Secondary | ICD-10-CM | POA: Diagnosis not present

## 2021-02-21 DIAGNOSIS — Z992 Dependence on renal dialysis: Secondary | ICD-10-CM | POA: Diagnosis not present

## 2021-02-24 DIAGNOSIS — N2581 Secondary hyperparathyroidism of renal origin: Secondary | ICD-10-CM | POA: Diagnosis not present

## 2021-02-24 DIAGNOSIS — E876 Hypokalemia: Secondary | ICD-10-CM | POA: Diagnosis not present

## 2021-02-24 DIAGNOSIS — Z992 Dependence on renal dialysis: Secondary | ICD-10-CM | POA: Diagnosis not present

## 2021-02-24 DIAGNOSIS — I159 Secondary hypertension, unspecified: Secondary | ICD-10-CM | POA: Diagnosis not present

## 2021-02-24 DIAGNOSIS — D631 Anemia in chronic kidney disease: Secondary | ICD-10-CM | POA: Diagnosis not present

## 2021-02-24 DIAGNOSIS — E8770 Fluid overload, unspecified: Secondary | ICD-10-CM | POA: Diagnosis not present

## 2021-02-24 DIAGNOSIS — D509 Iron deficiency anemia, unspecified: Secondary | ICD-10-CM | POA: Diagnosis not present

## 2021-02-24 DIAGNOSIS — N186 End stage renal disease: Secondary | ICD-10-CM | POA: Diagnosis not present

## 2021-02-25 ENCOUNTER — Other Ambulatory Visit: Payer: Self-pay

## 2021-02-25 DIAGNOSIS — N186 End stage renal disease: Secondary | ICD-10-CM

## 2021-02-26 DIAGNOSIS — E876 Hypokalemia: Secondary | ICD-10-CM | POA: Diagnosis not present

## 2021-02-26 DIAGNOSIS — N186 End stage renal disease: Secondary | ICD-10-CM | POA: Diagnosis not present

## 2021-02-26 DIAGNOSIS — D631 Anemia in chronic kidney disease: Secondary | ICD-10-CM | POA: Diagnosis not present

## 2021-02-26 DIAGNOSIS — D509 Iron deficiency anemia, unspecified: Secondary | ICD-10-CM | POA: Diagnosis not present

## 2021-02-26 DIAGNOSIS — N2581 Secondary hyperparathyroidism of renal origin: Secondary | ICD-10-CM | POA: Diagnosis not present

## 2021-02-26 DIAGNOSIS — Z992 Dependence on renal dialysis: Secondary | ICD-10-CM | POA: Diagnosis not present

## 2021-02-27 ENCOUNTER — Ambulatory Visit (INDEPENDENT_AMBULATORY_CARE_PROVIDER_SITE_OTHER): Payer: Medicare Other

## 2021-02-27 DIAGNOSIS — J309 Allergic rhinitis, unspecified: Secondary | ICD-10-CM

## 2021-02-28 DIAGNOSIS — Z992 Dependence on renal dialysis: Secondary | ICD-10-CM | POA: Diagnosis not present

## 2021-02-28 DIAGNOSIS — D631 Anemia in chronic kidney disease: Secondary | ICD-10-CM | POA: Diagnosis not present

## 2021-02-28 DIAGNOSIS — D509 Iron deficiency anemia, unspecified: Secondary | ICD-10-CM | POA: Diagnosis not present

## 2021-02-28 DIAGNOSIS — N186 End stage renal disease: Secondary | ICD-10-CM | POA: Diagnosis not present

## 2021-02-28 DIAGNOSIS — N2581 Secondary hyperparathyroidism of renal origin: Secondary | ICD-10-CM | POA: Diagnosis not present

## 2021-02-28 DIAGNOSIS — E876 Hypokalemia: Secondary | ICD-10-CM | POA: Diagnosis not present

## 2021-03-03 DIAGNOSIS — E876 Hypokalemia: Secondary | ICD-10-CM | POA: Diagnosis not present

## 2021-03-03 DIAGNOSIS — N2581 Secondary hyperparathyroidism of renal origin: Secondary | ICD-10-CM | POA: Diagnosis not present

## 2021-03-03 DIAGNOSIS — D509 Iron deficiency anemia, unspecified: Secondary | ICD-10-CM | POA: Diagnosis not present

## 2021-03-03 DIAGNOSIS — Z992 Dependence on renal dialysis: Secondary | ICD-10-CM | POA: Diagnosis not present

## 2021-03-03 DIAGNOSIS — N186 End stage renal disease: Secondary | ICD-10-CM | POA: Diagnosis not present

## 2021-03-03 DIAGNOSIS — D631 Anemia in chronic kidney disease: Secondary | ICD-10-CM | POA: Diagnosis not present

## 2021-03-04 DIAGNOSIS — H0288A Meibomian gland dysfunction right eye, upper and lower eyelids: Secondary | ICD-10-CM | POA: Diagnosis not present

## 2021-03-04 DIAGNOSIS — H0288B Meibomian gland dysfunction left eye, upper and lower eyelids: Secondary | ICD-10-CM | POA: Diagnosis not present

## 2021-03-04 DIAGNOSIS — Z961 Presence of intraocular lens: Secondary | ICD-10-CM | POA: Diagnosis not present

## 2021-03-04 DIAGNOSIS — E113513 Type 2 diabetes mellitus with proliferative diabetic retinopathy with macular edema, bilateral: Secondary | ICD-10-CM | POA: Diagnosis not present

## 2021-03-05 DIAGNOSIS — Z992 Dependence on renal dialysis: Secondary | ICD-10-CM | POA: Diagnosis not present

## 2021-03-05 DIAGNOSIS — E876 Hypokalemia: Secondary | ICD-10-CM | POA: Diagnosis not present

## 2021-03-05 DIAGNOSIS — D631 Anemia in chronic kidney disease: Secondary | ICD-10-CM | POA: Diagnosis not present

## 2021-03-05 DIAGNOSIS — N2581 Secondary hyperparathyroidism of renal origin: Secondary | ICD-10-CM | POA: Diagnosis not present

## 2021-03-05 DIAGNOSIS — D509 Iron deficiency anemia, unspecified: Secondary | ICD-10-CM | POA: Diagnosis not present

## 2021-03-05 DIAGNOSIS — N186 End stage renal disease: Secondary | ICD-10-CM | POA: Diagnosis not present

## 2021-03-06 DIAGNOSIS — J3081 Allergic rhinitis due to animal (cat) (dog) hair and dander: Secondary | ICD-10-CM | POA: Diagnosis not present

## 2021-03-06 NOTE — Progress Notes (Signed)
VIALS MADE. EXP 03-06-22

## 2021-03-07 DIAGNOSIS — D631 Anemia in chronic kidney disease: Secondary | ICD-10-CM | POA: Diagnosis not present

## 2021-03-07 DIAGNOSIS — Z992 Dependence on renal dialysis: Secondary | ICD-10-CM | POA: Diagnosis not present

## 2021-03-07 DIAGNOSIS — J3089 Other allergic rhinitis: Secondary | ICD-10-CM | POA: Diagnosis not present

## 2021-03-07 DIAGNOSIS — E876 Hypokalemia: Secondary | ICD-10-CM | POA: Diagnosis not present

## 2021-03-07 DIAGNOSIS — N2581 Secondary hyperparathyroidism of renal origin: Secondary | ICD-10-CM | POA: Diagnosis not present

## 2021-03-07 DIAGNOSIS — N186 End stage renal disease: Secondary | ICD-10-CM | POA: Diagnosis not present

## 2021-03-07 DIAGNOSIS — D509 Iron deficiency anemia, unspecified: Secondary | ICD-10-CM | POA: Diagnosis not present

## 2021-03-09 ENCOUNTER — Ambulatory Visit (HOSPITAL_COMMUNITY)
Admission: EM | Admit: 2021-03-09 | Discharge: 2021-03-09 | Disposition: A | Discharge status: Home / Self Care | Payer: Medicare Other | Attending: Emergency Medicine | Admitting: Emergency Medicine

## 2021-03-09 ENCOUNTER — Encounter (HOSPITAL_COMMUNITY): Payer: Self-pay | Admitting: Emergency Medicine

## 2021-03-09 ENCOUNTER — Other Ambulatory Visit: Payer: Self-pay

## 2021-03-09 DIAGNOSIS — R0981 Nasal congestion: Secondary | ICD-10-CM | POA: Insufficient documentation

## 2021-03-09 DIAGNOSIS — R059 Cough, unspecified: Secondary | ICD-10-CM

## 2021-03-09 DIAGNOSIS — J3489 Other specified disorders of nose and nasal sinuses: Secondary | ICD-10-CM | POA: Diagnosis not present

## 2021-03-09 DIAGNOSIS — Z20822 Contact with and (suspected) exposure to covid-19: Secondary | ICD-10-CM | POA: Insufficient documentation

## 2021-03-09 MED ORDER — IPRATROPIUM BROMIDE 0.06 % NA SOLN: 2.0000 | Freq: Four times a day (QID) | NASAL | 0 refills | Status: DC

## 2021-03-09 MED ORDER — AEROCHAMBER PLUS MISC: 2 refills | Status: AC

## 2021-03-09 MED ORDER — FAMOTIDINE 10 MG PO TABS: 10.0000 mg | ORAL_TABLET | ORAL | 0 refills | Status: DC

## 2021-03-09 NOTE — Discharge Instructions (Addendum)
We will contact you if your COVID comes back positive.  You will be a candidate for antivirals.  In the meantime, Atrovent nasal spray with the Flonase.  Try the Pepcid 10 mg every other day.  Use the spacer with your albuterol inhaler.  Follow-up with your doctor in several days, go to the ED if you have trouble breathing.

## 2021-03-09 NOTE — ED Provider Notes (Signed)
HPI  SUBJECTIVE:  Destiny Day is a 70 y.o. female who presents with 2 to 3 days of nasal congestion, postnasal drip, rhinorrhea, and inability to sleep at night secondary to cough.  She states that both her GERD and allergies are bothering her currently.  No fevers, bodies, headaches, sinus pain or pressure, sore throat, loss of sense of smell or taste, wheezing, shortness of breath, nausea, vomiting, diarrhea, abdominal pain.  No known COVID exposure.  She got the first booster.  No facial pain, upper dental pain.  Denies lower extremity edema, unintentional weight gain, nocturia.  No antibiotics past 3 months.  No antipyretic in the past 6 hours.  She has been taking her omeprazole, Singulair, using her ProAir once a day and is using her Flonase at night.  She states ProAir helps.  Symptoms are worse when she gets hot or drinking something cold.  She has an extensive past medical history including diabetes, ERCP on dialysis, hypertension, coronary disease, peripheral vascular disease, subdural hematoma, COPD, CHF, COVID in 2021, allergies and GERD.  HYI:FOYDX, Rachel Moulds, NP   Past Medical History:  Diagnosis Date   Abdominal bruit    Anemia    Anxiety    Arthritis    Osteoarthritis   Asthma    Cervical disc disease    "pinced nerve"   CHF (congestive heart failure) ([place])    Complication of anesthesia    " after I got home from my last procedure, I started itching."   Diabetes mellitus    Type II   Diverticulitis    ESRD (end stage renal disease) ([place])    dialysis - M/W/F- [place]   GERD (gastroesophageal reflux disease)    from medications   GI bleed 03/31/2013   Head injury 07/2017   History of hiatal hernia    Hyperlipidemia    Hypertension    Neuropathy    left leg   Osteoporosis    Peripheral vascular disease ([place])    Pneumonia    "very young" and a few years ago   PONV (postoperative nausea and vomiting)    Seasonal allergies    Shortness of breath dyspnea    WIth  exertion, when fluid builds   Sleep apnea    can't afford cpap     Past Surgical History:  Procedure Laterality Date   A/V SHUNTOGRAM N/A 09/22/2016   Procedure: A/V Shuntogram - left arm;  Surgeon: Serafina Mitchell, MD;  Location: [place] [facility];  Service: Cardiovascular;  Laterality: N/A;   A/V SHUNTOGRAM N/A 03/22/2018   Procedure: A/V SHUNTOGRAM - left arm;  Surgeon: Serafina Mitchell, MD;  Location: [place] [facility];  Service: Cardiovascular;  Laterality: N/A;   A/V SHUNTOGRAM Left 11/17/2018   Procedure: A/V SHUNTOGRAM;  Surgeon: Angelia Mould, MD;  Location: [place] [facility];  Service: Cardiovascular;  Laterality: Left;   ABDOMINAL HYSTERECTOMY  1993`   AMPUTATION Left 09/01/2016   Procedure: LEFT FOOT TRANSMETATARSAL AMPUTATION;  Surgeon: Newt Minion, MD;  Location: [place];  Service: Orthopedics;  Laterality: Left;   AMPUTATION Right 08/11/2017   Procedure: RIGHT GREAT TOE AMPUTATION DIGIT;  Surgeon: Rosetta Posner, MD;  Location: [place];  Service: Vascular;  Laterality: Right;   AMPUTATION Right 12/31/2017   Procedure: RIGHT TRANSMETATARSAL AMPUTATION;  Surgeon: Newt Minion, MD;  Location: [place];  Service: Orthopedics;  Laterality: Right;   AV FISTULA PLACEMENT Left 04/21/2016   Procedure: INSERTION OF ARTERIOVENOUS (AV) GORE-TEX GRAFT  ARM LEFT;  Surgeon: Elam Dutch, MD;  Location: [place];  Service: Vascular;  Laterality: Left;   [place] Left 07/10/2014   Procedure: BASCILIC VEIN TRANSPOSITION;  Surgeon: Angelia Mould, MD;  Location: [place];  Service: Vascular;  Laterality: Left;   [place] Right 11/08/2014   Procedure: FIRST STAGE BASILIC VEIN TRANSPOSITION;  Surgeon: Angelia Mould, MD;  Location: [place];  Service: Vascular;  Laterality: Right;   [place] Right 01/18/2015   Procedure: SECOND STAGE BASILIC VEIN TRANSPOSITION;  Surgeon: Angelia Mould, MD;  Location: [place];  Service: Vascular;   Laterality: Right;   CRANIOTOMY N/A 08/23/2017   Procedure: CRANIOTOMY HEMATOMA EVACUATION SUBDURAL;  Surgeon: Ashok Pall, MD;  Location: [place];  Service: Neurosurgery;  Laterality: N/A;   CRANIOTOMY Left 08/24/2017   Procedure: CRANIOTOMY FOR RECURRENT ACUTE SUBDURAL HEMATOMA;  Surgeon: Ashok Pall, MD;  Location: [place];  Service: Neurosurgery;  Laterality: Left;   ESOPHAGOGASTRODUODENOSCOPY N/A 03/31/2013   Procedure: ESOPHAGOGASTRODUODENOSCOPY (EGD);  Surgeon: Gatha Mayer, MD;  Location: [facility];  Service: Endoscopy;  Laterality: N/A;   EYE SURGERY     laser surgery   FEMORAL-POPLITEAL BYPASS GRAFT Left 07/08/2016   Procedure: LEFT  FEMORAL-BELOW KNEE POPLITEAL ARTERY BYPASS GRAFT USING 6MM X 80 CM PROPATEN GORETEX GRAFT WITH RINGS.;  Surgeon: Rosetta Posner, MD;  Location: [facility] OR;  Service: Vascular;  Laterality: Left;   FEMORAL-POPLITEAL BYPASS GRAFT Right 08/11/2017   Procedure: RIGHT FEMORAL TO BELOW KNEE POPLITEAL ARTERKY  BYPASS GRAFT USING 6MM RINGED PROPATEN GRAFT;  Surgeon: Rosetta Posner, MD;  Location: [place];  Service: Vascular;  Laterality: Right;   FISTULOGRAM Left 10/29/2014   Procedure: FISTULOGRAM;  Surgeon: Angelia Mould, MD;  Location: [facility];  Service: Cardiovascular;  Laterality: Left;   GAS/FLUID EXCHANGE Left 12/20/2018   Procedure: AIR GAS EXCHANGE;  Surgeon: Jalene Mullet, MD;  Location: [place];  Service: Ophthalmology;  Laterality: Left;   INSERTION OF DIALYSIS CATHETER N/A 09/05/2020   Procedure: INSERTION OF DIALYSIS CATHETER;  Surgeon: Cherre Robins, MD;  Location: [place];  Service: Vascular;  Laterality: N/A;   KNEE ARTHROSCOPY Right 05/06/2018   Procedure: RIGHT KNEE ARTHROSCOPY;  Surgeon: Newt Minion, MD;  Location: [place];  Service: Orthopedics;  Laterality: Right;   KNEE ARTHROSCOPY Right 06/10/2018   Procedure: RIGHT KNEE ARTHROSCOPY AND DEBRIDEMENT;  Surgeon: Newt Minion, MD;  Location: [place];  Service: Orthopedics;  Laterality: Right;    LIGATION OF ARTERIOVENOUS  FISTULA Left 04/21/2016   Procedure: LIGATION OF ARTERIOVENOUS  FISTULA LEFT ARM;  Surgeon: Elam Dutch, MD;  Location: [place];  Service: Vascular;  Laterality: Left;   LOWER EXTREMITY ANGIOGRAPHY N/A 04/06/2017   Procedure: Lower Extremity Angiography - Right;  Surgeon: Serafina Mitchell, MD;  Location: [place] [facility];  Service: Cardiovascular;  Laterality: N/A;   MEMBRANE PEEL Left 12/20/2018   Procedure: MEMBRANE PEEL;  Surgeon: Jalene Mullet, MD;  Location: [place];  Service: Ophthalmology;  Laterality: Left;   PATCH ANGIOPLASTY Right 01/18/2015   Procedure: BASILIC VEIN PATCH ANGIOPLASTY USING VASCUGUARD PATCH;  Surgeon: Angelia Mould, MD;  Location: [place];  Service: Vascular;  Laterality: Right;   PERIPHERAL VASCULAR BALLOON ANGIOPLASTY  09/22/2016   Procedure: Peripheral Vascular Balloon Angioplasty;  Surgeon: Serafina Mitchell, MD;  Location: [place] [facility];  Service: Cardiovascular;;  Lt. Fistula   PERIPHERAL VASCULAR BALLOON ANGIOPLASTY Left 03/22/2018   Procedure:  PERIPHERAL VASCULAR BALLOON ANGIOPLASTY;  Surgeon: Serafina Mitchell, MD;  Location: [place] [facility];  Service: Cardiovascular;  Laterality: Left;  Arm shunt   PERIPHERAL VASCULAR CATHETERIZATION N/A 06/23/2016   Procedure: Abdominal Aortogram w/Lower Extremity;  Surgeon: Serafina Mitchell, MD;  Location: [place] [facility];  Service: Cardiovascular;  Laterality: N/A;   PERIPHERAL VASCULAR CATHETERIZATION  06/23/2016   Procedure: Peripheral Vascular Intervention;  Surgeon: Serafina Mitchell, MD;  Location: [place] [facility];  Service: Cardiovascular;;  lt common and external illiac artery   PHOTOCOAGULATION WITH LASER Left 12/20/2018   Procedure: PHOTOCOAGULATION WITH LASER;  Surgeon: Jalene Mullet, MD;  Location: [place];  Service: Ophthalmology;  Laterality: Left;   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   Procedure: REPAIR OF COMPLEX TRACTION RETINAL DETACHMENT;   Surgeon: Jalene Mullet, MD;  Location: [place];  Service: Ophthalmology;  Laterality: Left;   REVISION OF ARTERIOVENOUS GORETEX GRAFT Left 09/05/2020   Procedure: REVISION OF ARTERIOVENOUS GORETEX GRAFT LEFT;  Surgeon: Cherre Robins, MD;  Location: [facility] OR;  Service: Vascular;  Laterality: Left;    Family History  Problem Relation Age of Onset   Other Mother        not sure of cause of death   Diabetes Father    Diabetes Sister    Diabetes Sister    Pancreatic cancer Maternal Grandmother    Colon cancer Neg Hx     Social History   Tobacco Use   Smoking status: Former    Packs/day: 0.35    Years: 40.00    Pack years: 14.00    Types: Cigarettes    Quit date: 05/10/2012    Years since quitting: 8.8   Smokeless tobacco: Never  Vaping Use   Vaping Use: Never used  Substance Use Topics   Alcohol use: No   Drug use: No    Comment: marijuana; quit in early 1980's    No current facility-administered medications for this encounter.  Current Outpatient Medications:    albuterol (PROAIR HFA) 108 (90 Base) MCG/ACT inhaler, Inhale 2 puffs into the lungs every 4 (four) hours as needed for wheezing or shortness of breath., Disp: 18 g, Rfl: 1   amLODipine (NORVASC) 10 MG tablet, Take 10 mg by mouth at bedtime., Disp: , Rfl:    aspirin EC 81 MG tablet, Take 81 mg by mouth daily., Disp: , Rfl:    atorvastatin (LIPITOR) 40 MG tablet, Take 1 tablet (40 mg total) by mouth daily., Disp: 90 tablet, Rfl: 3   busPIRone (BUSPAR) 7.5 MG tablet, Take 7.5 mg by mouth 2 (two) times daily., Disp: , Rfl:    carvedilol (COREG) 12.5 MG tablet, Take 12.5 mg by mouth See admin instructions. Take 1 tablet (12.5 mg) by mouth once daily in the evening on Mondays, Wednesdays, & Fridays. (Dialysis) Take 1 tablet (12.5 mg) by mouth twice daily on Sundays, Tuesdays, Thursdays, & Saturdays. (Non Dialysis), Disp: , Rfl:    famotidine (PEPCID) 10 MG tablet, Take 1 tablet (10 mg total) by mouth every other day., Disp:  15 tablet, Rfl: 0   glipiZIDE (GLUCOTROL) 5 MG tablet, Take 5 mg by mouth daily before breakfast., Disp: , Rfl:    ipratropium (ATROVENT) 0.06 % nasal spray, Place 2 sprays into both nostrils 4 (four) times daily., Disp: 15 mL, Rfl: 0   Isosorb Dinitrate-hydrALAZINE (BIDIL PO), Take by mouth., Disp: , Rfl:    isosorbide-hydrALAZINE (BIDIL) 20-37.5 MG tablet, Take 1 tablet by mouth  3 (three) times daily. BID on days of dialysis, Disp: 270 tablet, Rfl: 3   montelukast (SINGULAIR) 10 MG tablet, TAKE 1 TABLET BY MOUTH EVERYDAY AT BEDTIME, Disp: 30 tablet, Rfl: 5   Spacer/Aero-Holding Chambers (AEROCHAMBER PLUS) inhaler, Use with inhaler, Disp: 1 each, Rfl: 2   traZODone (DESYREL) 50 MG tablet, Take 50 mg by mouth at bedtime as needed for sleep., Disp: , Rfl:    cinacalcet (SENSIPAR) 60 MG tablet, Take 90 mg by mouth See admin instructions. Take 1 tablet (90 mg) by mouth in the evening on Mondays, Wednesdays, & Fridays (dialysis) Take 1 tablet (90 mg) by mouth twice daily on Sundays, Tuesdays, Thursdays, Saturdays. (non dialysis), Disp: , Rfl:    docusate sodium (COLACE) 100 MG capsule, Take 1 capsule (100 mg total) by mouth 2 (two) times daily., Disp: 30 capsule, Rfl: 0   EPINEPHrine (AUVI-Q) 0.3 mg/0.3 mL IJ SOAJ injection, Inject 0.3 mg into the muscle as needed for anaphylaxis., Disp: 1 each, Rfl: 1   ferric citrate (AURYXIA) 1 GM 210 MG(Fe) tablet, Take 210-420 mg by mouth See admin instructions. Take 2 tablets (420 mg) by mouth with each meal & take 1 tablet (210 mg) by mouth with snacks., Disp: , Rfl:    fluticasone (FLONASE) 50 MCG/ACT nasal spray, Place 2 sprays into both nostrils daily as needed for allergies or rhinitis., Disp: 16 g, Rfl: 5   fluticasone-salmeterol (ADVAIR HFA) 115-21 MCG/ACT inhaler, Inhale two puffs twice daily (Patient not taking: No sig reported), Disp: 1 each, Rfl: 5   gabapentin (NEURONTIN) 100 MG capsule, TAKE 1 CAPSULE (100 MG TOTAL) BY MOUTH AT BEDTIME. WHEN NECESSARY  FOR NEUROPATHY PAIN (Patient taking differently: Take 100 mg by mouth at bedtime as needed (pain). When necessary for neuropathy pain), Disp: 30 capsule, Rfl: 3   lidocaine-prilocaine (EMLA) cream, Apply 1 application topically 3 (three) times a week. 1-2 hours prior to dialysis, Disp: , Rfl: 12   multivitamin (RENA-VIT) TABS tablet, Take 1 tablet by mouth daily. , Disp: , Rfl: 5   omeprazole (PRILOSEC) 20 MG capsule, Take 20 mg by mouth daily. , Disp: , Rfl:    ondansetron (ZOFRAN) 4 MG tablet, Take 1 tablet (4 mg total) by mouth every 6 (six) hours as needed for nausea., Disp: 20 tablet, Rfl: 0   polyethylene glycol (MIRALAX / GLYCOLAX) 17 g packet, Take 17 g by mouth daily., Disp: 14 each, Rfl: 0   Vitamin D, Ergocalciferol, (DRISDOL) 1.25 MG (50000 UT) CAPS capsule, Take 50,000 Units by mouth every Sunday. Takes on Sunday, Disp: , Rfl:   Allergies  Allergen Reactions   Adhesive  [Tape] Hives   Lisinopril Cough   Prednisone Swelling and Other (See Comments)    Excessive fluid buildup     ROS  As noted in HPI.   Physical Exam  BP (!) 172/66 (BP Location: Right Arm)   Pulse 76   Temp 98.3 F (36.8 C) (Oral)   Resp (!) 24   SpO2 100%   Constitutional: Well developed, well nourished, no acute distress Eyes:  EOMI, conjunctiva normal bilaterally HENT: Normocephalic, atraumatic,mucus membranes moist.  Positive mucoid nasal congestion.  Pale, but not swollen turbinates.  No maxillary, frontal sinus tenderness.  Normal oropharynx.  No obvious postnasal drip. Respiratory: Normal inspiratory effort, lungs clear bilaterally Cardiovascular: Normal rate, regular rhythm, no murmurs rubs or gallops GI: nondistended skin: No rash, skin intact Musculoskeletal: no deformities Neurologic: Alert & oriented x 3, no focal neuro deficits Psychiatric: Speech  and behavior appropriate   ED Course   Medications - No data to display  Orders Placed This Encounter  Procedures   SARS CORONAVIRUS  2 (TAT 6-24 HRS) Nasopharyngeal Nasopharyngeal Swab    Standing Status:   Standing    Number of Occurrences:   1    Order Specific Question:   Is this test for diagnosis or screening    Answer:   Diagnosis of ill patient    Order Specific Question:   Symptomatic for COVID-19 as defined by CDC    Answer:   Yes    Order Specific Question:   Date of Symptom Onset    Answer:   03/07/2021    Order Specific Question:   Hospitalized for COVID-19    Answer:   No    Order Specific Question:   Admitted to ICU for COVID-19    Answer:   No    Order Specific Question:   Previously tested for COVID-19    Answer:   No    Order Specific Question:   Resident in a congregate (group) care setting    Answer:   No    Order Specific Question:   Employed in healthcare setting    Answer:   No    Order Specific Question:   Pregnant    Answer:   No    Order Specific Question:   Has patient completed COVID vaccination(s) (2 doses of Pfizer/Moderna 1 dose of [employer])    Answer:   Unknown    Results for orders placed or performed during the hospital encounter of 03/09/21 (from the past 24 hour(s))  SARS CORONAVIRUS 2 (TAT 6-24 HRS) Nasopharyngeal Nasopharyngeal Swab     Status: None   Collection Time: 03/09/21  4:48 PM   Specimen: Nasopharyngeal Swab  Result Value Ref Range   SARS Coronavirus 2 NEGATIVE NEGATIVE   No results found.  ED Clinical Impression  1. Nasal congestion with rhinorrhea   2. Cough   3. Encounter for laboratory testing for COVID-19 virus      ED Assessment/Plan  Difficult to tell whether this is URI, allergies or GERD.  Will check COVID.  She will be a candidate for antivirals if positive.  In the meantime, continue Flonase.  We will start Atrovent nasal spray as I suspect a good deal of the cough is coming from the nasal congestion and postnasal drip.  We can try some Pepcid as she states her GERD is bothering her.  We will give her 10 mg every other day.  There is no  need to renally dose the Atrovent.  will also prescribe a spacer for her inhalers.  Follow-up with her PMD as needed.  COVID negative.   Discussed labs, MDM, treatment plan, and plan for follow-up with patient.  patient agrees with plan.   Meds ordered this encounter  Medications   Spacer/Aero-Holding Chambers (AEROCHAMBER PLUS) inhaler    Sig: Use with inhaler    Dispense:  1 each    Refill:  2    Please educate patient on use   ipratropium (ATROVENT) 0.06 % nasal spray    Sig: Place 2 sprays into both nostrils 4 (four) times daily.    Dispense:  15 mL    Refill:  0   famotidine (PEPCID) 10 MG tablet    Sig: Take 1 tablet (10 mg total) by mouth every other day.    Dispense:  15 tablet    Refill:  0      *  This clinic note was created using [employer]. Therefore, there may be occasional mistakes despite careful proofreading.  ?   Melynda Ripple, MD 03/10/21 1027

## 2021-03-09 NOTE — ED Triage Notes (Signed)
Cough and sinus drainage for 2-3 days.  Patient has a runny nose, has to frequently blow nose.  No fever.

## 2021-03-10 DIAGNOSIS — Z992 Dependence on renal dialysis: Secondary | ICD-10-CM | POA: Diagnosis not present

## 2021-03-10 DIAGNOSIS — N2581 Secondary hyperparathyroidism of renal origin: Secondary | ICD-10-CM | POA: Diagnosis not present

## 2021-03-10 DIAGNOSIS — N186 End stage renal disease: Secondary | ICD-10-CM | POA: Diagnosis not present

## 2021-03-10 DIAGNOSIS — D631 Anemia in chronic kidney disease: Secondary | ICD-10-CM | POA: Diagnosis not present

## 2021-03-10 DIAGNOSIS — D509 Iron deficiency anemia, unspecified: Secondary | ICD-10-CM | POA: Diagnosis not present

## 2021-03-10 DIAGNOSIS — E876 Hypokalemia: Secondary | ICD-10-CM | POA: Diagnosis not present

## 2021-03-10 LAB — SARS CORONAVIRUS 2 (TAT 6-24 HRS): SARS Coronavirus 2: NEGATIVE

## 2021-03-10 NOTE — H&P (View-Only) (Signed)
[facility] [place] PROGRESS NOTE  ASSESSMENT / PLAN: Destiny Day is a 70 y.o. female with end-stage renal disease dialyzing via left upper extremity AV graft Monday, Wednesday, and Friday.  She has severe pain with cannulation.  Emla cream and lidocaine spray have been used without relief.  The patient strongly desires new access to be created.  I offered her several options for this.  She is most interested in a left forearm loop AV graft with placement of tunneled dialysis catheter.  I will ligate the left upper arm AV graft during the same surgery.  Plan to do this in the operating room 03/20/2021.  SUBJECTIVE: Patient reports painful cannulation.  She has tried Emla cream and lidocaine spray without relief.  The pain is so severe that she is contemplating stopping dialysis.  I revised a left upper arm AV graft for her/10/22.  OBJECTIVE: BP (!) 155/73 (BP Location: Right Arm, Patient Position: Sitting, Cuff Size: Normal)   Pulse 96   Temp 98.3 F (36.8 C)   Resp 20   Ht 5\' 5"  (1.651 m)   Wt 146 lb (66.2 kg)   SpO2 100%   BMI 24.30 kg/m   No acute distress Left upper extremity AV graft with good thrill.  Skin overlying the graft is hypopigmented and appears irritated.  No palpable left radial pulse  CBC Latest Ref Rng & Units 01/05/2021 09/05/2020 12/08/2019  WBC 4.0 - 10.5 K/uL 6.9 - 6.4  Hemoglobin 12.0 - 15.0 g/dL 12.1 12.9 11.2  Hematocrit 36.0 - 46.0 % 39.2 38.0 32.1(L)  Platelets 150 - 400 K/uL 268 - 269     CMP Latest Ref Rng & Units 01/06/2021 01/05/2021 09/05/2020  Glucose 70 - 99 mg/dL 113(H) 130(H) 192(H)  BUN 8 - 23 mg/dL 30(H) 21 31(H)  Creatinine 0.44 - 1.00 mg/dL 7.41(H) 5.50(H) 5.10(H)  Sodium 135 - 145 mmol/L 133(L) 137 135  Potassium 3.5 - 5.1 mmol/L 3.7 3.4(L) 3.9  Chloride 98 - 111 mmol/L 95(L) 94(L) 96(L)  CO2 22 - 32 mmol/L 25 27 -  Calcium 8.9 - 10.3 mg/dL 8.0(L) 8.3(L) -  Total Protein 6.5 - 8.1 g/dL - - -  Total Bilirubin  0.3 - 1.2 mg/dL - - -  Alkaline Phos 38 - 126 U/L - - -  AST 15 - 41 U/L - - -  ALT 0 - 44 U/L - - -   DIALYSIS ACCESS   Patient Name:  Destiny Day  Date of Exam:   03/11/2021  Medical Rec #: [id number]           Accession #:    [id number]  Date of Birth: Nov 12, 1950           Patient Gender: F  Patient Age:   61 years  Exam Location:  [name]  Procedure:      VAS US DUPLEX DIALYSIS ACCESS (AVF, AVG)  Referring Phys: Jamelle Haring    ---------------------------------------------------------------------------  -----     Reason for Exam: Painful cannulation every time.   Access Site: Left Upper Extremity.   Access Type: Upper arm loop AVG.   History: 09/05/2020: Revision of left arteriovenous Gortex graft.              09/22/2016: Left upper arm loop AVG.   Comparison Study: 10/24/2020: Patent left AV graft with no visualized                    narrowing.  Performing Technologist: Ivan Croft      Examination Guidelines: A complete evaluation includes B-mode imaging,  spectral  Doppler, color Doppler, and power Doppler as needed of all accessible  portions  of each vessel. Unilateral testing is considered an integral part of a  complete  examination. Limited examinations for reoccurring indications may be  performed  as noted.      Findings:         +--------------------+----------+-----------------+------------------------  ----+  AVG                 PSV (cm/s)Flow Vol (mL/min)          Describe              +--------------------+----------+-----------------+------------------------  ----+  Native artery inflow   270          1489                                       +--------------------+----------+-----------------+------------------------  ----+  Arterial anastomosis   318                                                     +--------------------+----------+-----------------+------------------------  ----+   Prox graft             513                                                     +--------------------+----------+-----------------+------------------------  ----+  Mid graft              211                                                     +--------------------+----------+-----------------+------------------------  ----+  Distal graft           353                      area of visible plaque  and                                                      elevated velocities        +--------------------+----------+-----------------+------------------------  ----+  Venous anastomosis     364                                                     +--------------------+----------+-----------------+------------------------  ----+  Venous outflow         156                                                     +--------------------+----------+-----------------+------------------------  ----+  Summary:  Patent left arteriovenous graft. Area of visible plaque in the distal  graft just  before venous anastomosis with velocity increasing from 129 cm/s to 353  cm/s.    *See table(s) above for measurements and observations.     Diagnosing physician: Jamelle Haring  Electronically signed by Jamelle Haring on 03/11/2021 at 3:17:27 PM.        Yevonne Aline. Stanford Breed, MD [facility] Phone Number: [phone] 03/11/2021 4:45 PM

## 2021-03-10 NOTE — Progress Notes (Signed)
[facility] [place] PROGRESS NOTE  ASSESSMENT / PLAN: Destiny Day is a 70 y.o. female with end-stage renal disease dialyzing via left upper extremity AV graft Monday, Wednesday, and Friday.  She has severe pain with cannulation.  Emla cream and lidocaine spray have been used without relief.  The patient strongly desires new access to be created.  I offered her several options for this.  She is most interested in a left forearm loop AV graft with placement of tunneled dialysis catheter.  I will ligate the left upper arm AV graft during the same surgery.  Plan to do this in the operating room 03/20/2021.  SUBJECTIVE: Patient reports painful cannulation.  She has tried Emla cream and lidocaine spray without relief.  The pain is so severe that she is contemplating stopping dialysis.  I revised a left upper arm AV graft for her/10/22.  OBJECTIVE: BP (!) 155/73 (BP Location: Right Arm, Patient Position: Sitting, Cuff Size: Normal)   Pulse 96   Temp 98.3 F (36.8 C)   Resp 20   Ht 5\' 5"  (1.651 m)   Wt 146 lb (66.2 kg)   SpO2 100%   BMI 24.30 kg/m   No acute distress Left upper extremity AV graft with good thrill.  Skin overlying the graft is hypopigmented and appears irritated.  No palpable left radial pulse  CBC Latest Ref Rng & Units 01/05/2021 09/05/2020 12/08/2019  WBC 4.0 - 10.5 K/uL 6.9 - 6.4  Hemoglobin 12.0 - 15.0 g/dL 12.1 12.9 11.2  Hematocrit 36.0 - 46.0 % 39.2 38.0 32.1(L)  Platelets 150 - 400 K/uL 268 - 269     CMP Latest Ref Rng & Units 01/06/2021 01/05/2021 09/05/2020  Glucose 70 - 99 mg/dL 113(H) 130(H) 192(H)  BUN 8 - 23 mg/dL 30(H) 21 31(H)  Creatinine 0.44 - 1.00 mg/dL 7.41(H) 5.50(H) 5.10(H)  Sodium 135 - 145 mmol/L 133(L) 137 135  Potassium 3.5 - 5.1 mmol/L 3.7 3.4(L) 3.9  Chloride 98 - 111 mmol/L 95(L) 94(L) 96(L)  CO2 22 - 32 mmol/L 25 27 -  Calcium 8.9 - 10.3 mg/dL 8.0(L) 8.3(L) -  Total Protein 6.5 - 8.1 g/dL - - -  Total Bilirubin  0.3 - 1.2 mg/dL - - -  Alkaline Phos 38 - 126 U/L - - -  AST 15 - 41 U/L - - -  ALT 0 - 44 U/L - - -   DIALYSIS ACCESS   Patient Name:  Destiny Day  Date of Exam:   03/11/2021  Medical Rec #: [id number]           Accession #:    [id number]  Date of Birth: Dec 25, 1950           Patient Gender: F  Patient Age:   76 years  Exam Location:  [name]  Procedure:      VAS US DUPLEX DIALYSIS ACCESS (AVF, AVG)  Referring Phys: Jamelle Haring    ---------------------------------------------------------------------------  -----     Reason for Exam: Painful cannulation every time.   Access Site: Left Upper Extremity.   Access Type: Upper arm loop AVG.   History: 09/05/2020: Revision of left arteriovenous Gortex graft.              09/22/2016: Left upper arm loop AVG.   Comparison Study: 10/24/2020: Patent left AV graft with no visualized                    narrowing.  Performing Technologist: Ivan Croft      Examination Guidelines: A complete evaluation includes B-mode imaging,  spectral  Doppler, color Doppler, and power Doppler as needed of all accessible  portions  of each vessel. Unilateral testing is considered an integral part of a  complete  examination. Limited examinations for reoccurring indications may be  performed  as noted.      Findings:         +--------------------+----------+-----------------+------------------------  ----+  AVG                 PSV (cm/s)Flow Vol (mL/min)          Describe              +--------------------+----------+-----------------+------------------------  ----+  Native artery inflow   270          1489                                       +--------------------+----------+-----------------+------------------------  ----+  Arterial anastomosis   318                                                     +--------------------+----------+-----------------+------------------------  ----+   Prox graft             513                                                     +--------------------+----------+-----------------+------------------------  ----+  Mid graft              211                                                     +--------------------+----------+-----------------+------------------------  ----+  Distal graft           353                      area of visible plaque  and                                                      elevated velocities        +--------------------+----------+-----------------+------------------------  ----+  Venous anastomosis     364                                                     +--------------------+----------+-----------------+------------------------  ----+  Venous outflow         156                                                     +--------------------+----------+-----------------+------------------------  ----+  Summary:  Patent left arteriovenous graft. Area of visible plaque in the distal  graft just  before venous anastomosis with velocity increasing from 129 cm/s to 353  cm/s.    *See table(s) above for measurements and observations.     Diagnosing physician: Jamelle Haring  Electronically signed by Jamelle Haring on 03/11/2021 at 3:17:27 PM.        Yevonne Aline. Stanford Breed, MD [facility] Phone Number: [phone] 03/11/2021 4:45 PM

## 2021-03-11 ENCOUNTER — Encounter: Payer: Self-pay | Admitting: Vascular Surgery

## 2021-03-11 ENCOUNTER — Ambulatory Visit (INDEPENDENT_AMBULATORY_CARE_PROVIDER_SITE_OTHER): Payer: Medicare Other | Admitting: *Deleted

## 2021-03-11 ENCOUNTER — Other Ambulatory Visit: Payer: Self-pay

## 2021-03-11 ENCOUNTER — Ambulatory Visit (HOSPITAL_COMMUNITY)
Admission: RE | Admit: 2021-03-11 | Discharge: 2021-03-11 | Disposition: A | Discharge status: Home / Self Care | Payer: Medicare Other | Source: Ambulatory Visit | Attending: Vascular Surgery | Admitting: Vascular Surgery

## 2021-03-11 ENCOUNTER — Ambulatory Visit (INDEPENDENT_AMBULATORY_CARE_PROVIDER_SITE_OTHER): Payer: Medicare Other | Admitting: Vascular Surgery

## 2021-03-11 VITALS — BP 155/73 | HR 96 | Temp 98.3°F | Resp 20 | Ht 65.0 in | Wt 146.0 lb

## 2021-03-11 DIAGNOSIS — Z992 Dependence on renal dialysis: Secondary | ICD-10-CM

## 2021-03-11 DIAGNOSIS — N186 End stage renal disease: Secondary | ICD-10-CM | POA: Insufficient documentation

## 2021-03-11 DIAGNOSIS — J309 Allergic rhinitis, unspecified: Secondary | ICD-10-CM

## 2021-03-12 ENCOUNTER — Other Ambulatory Visit: Payer: Self-pay

## 2021-03-12 ENCOUNTER — Encounter (HOSPITAL_COMMUNITY): Payer: Self-pay | Admitting: Vascular Surgery

## 2021-03-12 DIAGNOSIS — N2581 Secondary hyperparathyroidism of renal origin: Secondary | ICD-10-CM | POA: Diagnosis not present

## 2021-03-12 DIAGNOSIS — E876 Hypokalemia: Secondary | ICD-10-CM | POA: Diagnosis not present

## 2021-03-12 DIAGNOSIS — N186 End stage renal disease: Secondary | ICD-10-CM | POA: Diagnosis not present

## 2021-03-12 DIAGNOSIS — Z992 Dependence on renal dialysis: Secondary | ICD-10-CM | POA: Diagnosis not present

## 2021-03-12 DIAGNOSIS — D631 Anemia in chronic kidney disease: Secondary | ICD-10-CM | POA: Diagnosis not present

## 2021-03-12 DIAGNOSIS — D509 Iron deficiency anemia, unspecified: Secondary | ICD-10-CM | POA: Diagnosis not present

## 2021-03-12 NOTE — Progress Notes (Signed)
Spoke with pt. Instructed not to take Glucotrol morning of surgery 03/13/2021. Instructed to check blood sugar as soon as she gets up in the morning and every 2 hours until she comes to the hospital. If at anytime it drops below 70 drink half glass (4oz) clear juice. Recheck blood sugar in fifteen minutes if blood sugar still below 70 call [phone] and ask to speakk to a nurse.  03/13/21 Pt is covid negative.

## 2021-03-12 NOTE — Progress Notes (Signed)
Anesthesia Chart Review: Same-day work-up  Follows with cardiologist Dr. Einar Gip for history of difficult to treat hypertension, HFpEF, hyperlipidemia. She also has history of PAD s/p left iliac artery stenting and bilateral femoro-popliteal grafting,  s/p left TMA on 09/01/2016, and right TMA on 12/31/2017 by Dr. Sharol Given.  Recent admission 6/12 through 01/06/2021 for acute SOB and acute on chronic diastolic heart failure with severe hypertension.  She improved with urgent hemodialysis.  Post discharge she followed up with Dr. Einar Gip 01/09/2021.  Reportedly feeling well at that time, had recovered well from her recent hospitalization.  Updated echo was ordered to reevaluate LV systolic function.  Per note, "unless abnormal, I will see her back in a year.  From a PAD standpoint she is remained stable."  Echo was done 01/21/2021 and showed no significant changes compared with prior, EF 55%, moderate concentric hypertrophy of the left ventricle, normal wall motion, no significant valvular abnormalities.  ESRD on HD MWF via LUE AV graft.  She will need day of surgery labs and evaluation.  EKG 01/09/2021: Normal sinus rhythm at rate of 91 bpm, left atrial enlargement, normal axis.  Incomplete right bundle branch block.  Nonspecific ST abnormality, no significant change from 01/06/2020.  Echocardiogram 01/21/2021: Normal LV systolic function with EF 55%. Left ventricle cavity is small. Moderate concentric hypertrophy of the left ventricle. Normal global wall motion. Doppler evidence of grade I (impaired) diastolic dysfunction, elevated LAP. Calculated EF 55%. No mitral valve regurgitation. Mild mitral valve leaflet calcification. No evidence of mitral valve stenosis.   Wynonia Musty [facility] Short Stay Center/Anesthesiology Phone [phone] 03/12/2021 3:10 PM

## 2021-03-12 NOTE — Anesthesia Preprocedure Evaluation (Addendum)
Anesthesia Evaluation  Patient identified by MRN, date of birth, ID band Patient awake    Reviewed: Allergy & Precautions, NPO status , Patient's Chart, lab work & pertinent test results  Airway Mallampati: II  TM Distance: >3 FB Neck ROM: Full    Dental  (+) Edentulous Upper, Edentulous Lower   Pulmonary asthma , sleep apnea , COPD, former smoker,    Pulmonary exam normal        Cardiovascular hypertension, Pt. on medications and Pt. on home beta blockers + Peripheral Vascular Disease and +CHF   Rhythm:Regular Rate:Normal     Neuro/Psych Anxiety negative neurological ROS     GI/Hepatic Neg liver ROS, hiatal hernia, GERD  Medicated,  Endo/Other  diabetes  Renal/GU ESRF and DialysisRenal disease  negative genitourinary   Musculoskeletal  (+) Arthritis ,   Abdominal (+)  Abdomen: soft. Bowel sounds: normal.  Peds  Hematology  (+) anemia ,   Anesthesia Other Findings   Reproductive/Obstetrics                           Anesthesia Physical Anesthesia Plan  ASA: 3  Anesthesia Plan: MAC   Post-op Pain Management:    Induction: Intravenous  PONV Risk Score and Plan: 2 and Ondansetron, Dexamethasone, Treatment may vary due to age or medical condition and Propofol infusion  Airway Management Planned: Simple Face Mask, Natural Airway and Nasal Cannula  Additional Equipment: None  Intra-op Plan:   Post-operative Plan:   Informed Consent: I have reviewed the patients History and Physical, chart, labs and discussed the procedure including the risks, benefits and alternatives for the proposed anesthesia with the patient or authorized representative who has indicated his/her understanding and acceptance.     Dental advisory given  Plan Discussed with: CRNA  Anesthesia Plan Comments: (Lab Results      Component                Value               Date                      WBC                       6.9                 01/05/2021                HGB                      13.3                03/13/2021                HCT                      39.0                03/13/2021                MCV                      98.5                01/05/2021                PLT  268                 01/05/2021           Lab Results      Component                Value               Date                      NA                       137                 03/13/2021                K                        4.0                 03/13/2021                CO2                      25                  01/06/2021                GLUCOSE                  72                  03/13/2021                BUN                      20                  03/13/2021                CREATININE               4.50 (H)            03/13/2021                CALCIUM                  8.0 (L)             01/06/2021                GFRNONAA                 6 (L)               01/06/2021                GFRAA                    6 (L)               06/26/2019           PAT note by Karoline Caldwell, PA-C: Follows with cardiologist Dr. Einar Gip for history of difficult to treat hypertension, HFpEF, hyperlipidemia. She also has history of PAD s/p left iliac artery stenting and bilateral femoro-popliteal grafting, s/p left TMA on 09/01/2016, and right TMA on 12/31/2017 by Dr.  Sharol Given.  Recent admission 6/12 through 01/06/2021 for acute SOB and acute on chronic diastolic heart failure with severe hypertension.  She improved with urgent hemodialysis.  Post discharge she followed up with Dr. Einar Gip 01/09/2021.  Reportedly feeling well at that time, had recovered well from her recent hospitalization.  Updated echo was ordered to reevaluate LV systolic function.  Per note, "unless abnormal, I will see her back in a year.  From a PAD standpoint she is remained stable."  Echo was done 01/21/2021 and showed no significant changes compared with prior, EF  55%, moderate concentric hypertrophy of the left ventricle, normal wall motion, no significant valvular abnormalities.  ESRD on HD MWF via LUE AV graft.  She will need day of surgery labs and evaluation.  EKG 01/09/2021: Normal sinus rhythm at rate of 91 bpm, left atrial enlargement, normal axis.  Incomplete right bundle branch block.  Nonspecific ST abnormality, no significant change from 01/06/2020.  Echocardiogram 01/21/2021: Normal LV systolic function with EF 55%. Left ventricle cavity is small. Moderate concentric hypertrophy of the left ventricle. Normal global wall motion. Doppler evidence of grade I (impaired) diastolic dysfunction, elevated LAP. Calculated EF 55%. No mitral valve regurgitation. Mild mitral valve leaflet calcification. No evidence of mitral valve stenosis. )      Anesthesia Quick Evaluation

## 2021-03-13 ENCOUNTER — Encounter (HOSPITAL_COMMUNITY)
Admission: RE | Disposition: A | Discharge status: Home / Self Care | Payer: Self-pay | Source: Home / Self Care | Attending: Vascular Surgery

## 2021-03-13 ENCOUNTER — Ambulatory Visit (HOSPITAL_COMMUNITY): Payer: Medicare Other

## 2021-03-13 ENCOUNTER — Ambulatory Visit (HOSPITAL_COMMUNITY)
Admission: RE | Admit: 2021-03-13 | Discharge: 2021-03-13 | Disposition: A | Discharge status: Home / Self Care | Payer: Medicare Other | Attending: Vascular Surgery | Admitting: Vascular Surgery

## 2021-03-13 ENCOUNTER — Encounter (HOSPITAL_COMMUNITY): Payer: Self-pay | Admitting: Vascular Surgery

## 2021-03-13 ENCOUNTER — Ambulatory Visit (HOSPITAL_COMMUNITY): Payer: Medicare Other | Admitting: Physician Assistant

## 2021-03-13 ENCOUNTER — Other Ambulatory Visit: Payer: Self-pay | Admitting: *Deleted

## 2021-03-13 DIAGNOSIS — I5032 Chronic diastolic (congestive) heart failure: Secondary | ICD-10-CM | POA: Diagnosis not present

## 2021-03-13 DIAGNOSIS — Z992 Dependence on renal dialysis: Secondary | ICD-10-CM | POA: Insufficient documentation

## 2021-03-13 DIAGNOSIS — N186 End stage renal disease: Secondary | ICD-10-CM | POA: Insufficient documentation

## 2021-03-13 DIAGNOSIS — T82590A Other mechanical complication of surgically created arteriovenous fistula, initial encounter: Secondary | ICD-10-CM | POA: Diagnosis not present

## 2021-03-13 DIAGNOSIS — Z419 Encounter for procedure for purposes other than remedying health state, unspecified: Secondary | ICD-10-CM

## 2021-03-13 DIAGNOSIS — M19011 Primary osteoarthritis, right shoulder: Secondary | ICD-10-CM | POA: Diagnosis not present

## 2021-03-13 DIAGNOSIS — I132 Hypertensive heart and chronic kidney disease with heart failure and with stage 5 chronic kidney disease, or end stage renal disease: Secondary | ICD-10-CM | POA: Diagnosis not present

## 2021-03-13 DIAGNOSIS — Y841 Kidney dialysis as the cause of abnormal reaction of the patient, or of later complication, without mention of misadventure at the time of the procedure: Secondary | ICD-10-CM | POA: Insufficient documentation

## 2021-03-13 DIAGNOSIS — Z7984 Long term (current) use of oral hypoglycemic drugs: Secondary | ICD-10-CM | POA: Diagnosis not present

## 2021-03-13 DIAGNOSIS — T82510A Breakdown (mechanical) of surgically created arteriovenous fistula, initial encounter: Secondary | ICD-10-CM | POA: Diagnosis not present

## 2021-03-13 DIAGNOSIS — I700 Atherosclerosis of aorta: Secondary | ICD-10-CM | POA: Diagnosis not present

## 2021-03-13 DIAGNOSIS — I5033 Acute on chronic diastolic (congestive) heart failure: Secondary | ICD-10-CM | POA: Diagnosis not present

## 2021-03-13 DIAGNOSIS — E1122 Type 2 diabetes mellitus with diabetic chronic kidney disease: Secondary | ICD-10-CM | POA: Diagnosis not present

## 2021-03-13 DIAGNOSIS — Z9889 Other specified postprocedural states: Secondary | ICD-10-CM

## 2021-03-13 DIAGNOSIS — E785 Hyperlipidemia, unspecified: Secondary | ICD-10-CM | POA: Diagnosis not present

## 2021-03-13 DIAGNOSIS — R079 Chest pain, unspecified: Secondary | ICD-10-CM | POA: Diagnosis not present

## 2021-03-13 DIAGNOSIS — N179 Acute kidney failure, unspecified: Secondary | ICD-10-CM | POA: Diagnosis not present

## 2021-03-13 HISTORY — PX: INSERTION OF DIALYSIS CATHETER: SHX1324

## 2021-03-13 LAB — POCT I-STAT, CHEM 8
BUN: 20 mg/dL (ref 8–23)
Calcium, Ion: 1.08 mmol/L — ABNORMAL LOW (ref 1.15–1.40)
Chloride: 97 mmol/L — ABNORMAL LOW (ref 98–111)
Creatinine, Ser: 4.5 mg/dL — ABNORMAL HIGH (ref 0.44–1.00)
Glucose, Bld: 72 mg/dL (ref 70–99)
HCT: 39 % (ref 36.0–46.0)
Hemoglobin: 13.3 g/dL (ref 12.0–15.0)
Potassium: 4 mmol/L (ref 3.5–5.1)
Sodium: 137 mmol/L (ref 135–145)
TCO2: 31 mmol/L (ref 22–32)

## 2021-03-13 LAB — GLUCOSE, CAPILLARY
Glucose-Capillary: 84 mg/dL (ref 70–99)
Glucose-Capillary: 81 mg/dL (ref 70–99)

## 2021-03-13 SURGERY — INSERTION OF DIALYSIS CATHETER
Anesthesia: Monitor Anesthesia Care | Site: Chest | Laterality: Left

## 2021-03-13 MED ORDER — LIDOCAINE 2% (20 MG/ML) 5 ML SYRINGE
INTRAMUSCULAR | Status: AC
  Filled 2021-03-13: qty 5

## 2021-03-13 MED ORDER — CHLORHEXIDINE GLUCONATE 4 % EX LIQD: 60.0000 mL | Freq: Once | CUTANEOUS | Status: DC

## 2021-03-13 MED ORDER — SODIUM CHLORIDE 0.9 % IV SOLN: INTRAVENOUS | Status: DC

## 2021-03-13 MED ORDER — HEPARIN SODIUM (PORCINE) 1000 UNIT/ML IJ SOLN
INTRAMUSCULAR | Status: DC | PRN
  Administered 2021-03-13: 3200 [IU] via INTRA_ARTERIAL

## 2021-03-13 MED ORDER — LIDOCAINE HCL (CARDIAC) PF 100 MG/5ML IV SOSY
PREFILLED_SYRINGE | INTRAVENOUS | Status: DC | PRN
  Administered 2021-03-13: 40 mg via INTRATRACHEAL

## 2021-03-13 MED ORDER — FENTANYL CITRATE (PF) 100 MCG/2ML IJ SOLN
INTRAMUSCULAR | Status: DC | PRN
  Administered 2021-03-13 (×2): 25 ug via INTRAVENOUS

## 2021-03-13 MED ORDER — PROPOFOL 10 MG/ML IV BOLUS
INTRAVENOUS | Status: AC
  Filled 2021-03-13: qty 20

## 2021-03-13 MED ORDER — ORAL CARE MOUTH RINSE: 15.0000 mL | Freq: Once | OROMUCOSAL | Status: AC

## 2021-03-13 MED ORDER — PROPOFOL 500 MG/50ML IV EMUL
INTRAVENOUS | Status: DC | PRN
  Administered 2021-03-13: 25 ug/kg/min via INTRAVENOUS

## 2021-03-13 MED ORDER — PROPOFOL 10 MG/ML IV BOLUS
INTRAVENOUS | Status: DC | PRN
  Administered 2021-03-13 (×2): 20 mg via INTRAVENOUS

## 2021-03-13 MED ORDER — MIDAZOLAM HCL 5 MG/5ML IJ SOLN
INTRAMUSCULAR | Status: DC | PRN
  Administered 2021-03-13 (×2): 1 mg via INTRAVENOUS

## 2021-03-13 MED ORDER — LIDOCAINE-EPINEPHRINE (PF) 1 %-1:200000 IJ SOLN
INTRAMUSCULAR | Status: DC | PRN
  Administered 2021-03-13: 15 mL

## 2021-03-13 MED ORDER — MIDAZOLAM HCL 2 MG/2ML IJ SOLN
INTRAMUSCULAR | Status: AC
  Filled 2021-03-13: qty 2

## 2021-03-13 MED ORDER — CHLORHEXIDINE GLUCONATE 0.12 % MT SOLN
15.0000 mL | Freq: Once | OROMUCOSAL | Status: AC
  Administered 2021-03-13: 15 mL via OROMUCOSAL
  Filled 2021-03-13: qty 15

## 2021-03-13 MED ORDER — HEPARIN SODIUM (PORCINE) 1000 UNIT/ML IJ SOLN
INTRAMUSCULAR | Status: AC
  Filled 2021-03-13: qty 1

## 2021-03-13 MED ORDER — CEFAZOLIN SODIUM-DEXTROSE 2-4 GM/100ML-% IV SOLN
2.0000 g | INTRAVENOUS | Status: AC
  Administered 2021-03-13: 2 g via INTRAVENOUS
  Filled 2021-03-13: qty 100

## 2021-03-13 MED ORDER — HEPARIN 6000 UNIT IRRIGATION SOLUTION
Status: DC | PRN
  Administered 2021-03-13: 1

## 2021-03-13 MED ORDER — HEPARIN 6000 UNIT IRRIGATION SOLUTION
Status: AC
  Filled 2021-03-13: qty 500

## 2021-03-13 MED ORDER — LIDOCAINE-EPINEPHRINE (PF) 1 %-1:200000 IJ SOLN
INTRAMUSCULAR | Status: AC
  Filled 2021-03-13: qty 30

## 2021-03-13 MED ORDER — FENTANYL CITRATE (PF) 250 MCG/5ML IJ SOLN
INTRAMUSCULAR | Status: AC
  Filled 2021-03-13: qty 5

## 2021-03-13 SURGICAL SUPPLY — 57 items
ARMBAND PINK RESTRICT EXTREMIT (MISCELLANEOUS) ×6 IMPLANT
BAG COUNTER SPONGE SURGICOUNT (BAG) ×3 IMPLANT
BAG DECANTER FOR FLEXI CONT (MISCELLANEOUS) ×3 IMPLANT
BIOPATCH RED 1 DISK 7.0 (GAUZE/BANDAGES/DRESSINGS) ×3 IMPLANT
CANISTER SUCT 3000ML PPV (MISCELLANEOUS) ×3 IMPLANT
CATH PALINDROME-P 19CM W/VT (CATHETERS) ×3 IMPLANT
CATH PALINDROME-P 23CM W/VT (CATHETERS) IMPLANT
CATH PALINDROME-P 28CM W/VT (CATHETERS) IMPLANT
CLIP LIGATING EXTRA MED SLVR (CLIP) ×3 IMPLANT
CLIP LIGATING EXTRA SM BLUE (MISCELLANEOUS) ×3 IMPLANT
CLIP VESOCCLUDE MED 6/CT (CLIP) ×3 IMPLANT
CLIP VESOCCLUDE SM WIDE 6/CT (CLIP) ×3 IMPLANT
COVER PROBE W GEL 5X96 (DRAPES) ×3 IMPLANT
COVER SURGICAL LIGHT HANDLE (MISCELLANEOUS) ×3 IMPLANT
DERMABOND ADVANCED (GAUZE/BANDAGES/DRESSINGS) ×1
DERMABOND ADVANCED .7 DNX12 (GAUZE/BANDAGES/DRESSINGS) ×2 IMPLANT
DRAPE C-ARM 42X72 X-RAY (DRAPES) ×3 IMPLANT
DRAPE CHEST BREAST 15X10 FENES (DRAPES) ×3 IMPLANT
ELECT REM PT RETURN 9FT ADLT (ELECTROSURGICAL) ×3
ELECTRODE REM PT RTRN 9FT ADLT (ELECTROSURGICAL) ×2 IMPLANT
GAUZE 4X4 16PLY ~~LOC~~+RFID DBL (SPONGE) ×3 IMPLANT
GAUZE SPONGE 4X4 12PLY STRL (GAUZE/BANDAGES/DRESSINGS) ×3 IMPLANT
GLOVE SURG ENC MOIS LTX SZ7 (GLOVE) ×3 IMPLANT
GLOVE SURG ENC MOIS LTX SZ7.5 (GLOVE) ×3 IMPLANT
GLOVE SURG UNDER POLY LF SZ7.5 (GLOVE) ×3 IMPLANT
GOWN STRL REUS W/ TWL LRG LVL3 (GOWN DISPOSABLE) ×6 IMPLANT
GOWN STRL REUS W/ TWL XL LVL3 (GOWN DISPOSABLE) ×2 IMPLANT
GOWN STRL REUS W/TWL LRG LVL3 (GOWN DISPOSABLE) ×9
GOWN STRL REUS W/TWL XL LVL3 (GOWN DISPOSABLE) ×3
HEMOSTAT SNOW SURGICEL 2X4 (HEMOSTASIS) IMPLANT
INSERT FOGARTY SM (MISCELLANEOUS) ×3 IMPLANT
KIT BASIN OR (CUSTOM PROCEDURE TRAY) ×3 IMPLANT
KIT PALINDROME-P 55CM (CATHETERS) IMPLANT
KIT TURNOVER KIT B (KITS) ×3 IMPLANT
NEEDLE 18GX1X1/2 (RX/OR ONLY) (NEEDLE) ×3 IMPLANT
NEEDLE HYPO 25GX1X1/2 BEV (NEEDLE) ×3 IMPLANT
NS IRRIG 1000ML POUR BTL (IV SOLUTION) ×3 IMPLANT
PACK CV ACCESS (CUSTOM PROCEDURE TRAY) ×3 IMPLANT
PACK SURGICAL SETUP 50X90 (CUSTOM PROCEDURE TRAY) ×3 IMPLANT
PAD ARMBOARD 7.5X6 YLW CONV (MISCELLANEOUS) ×6 IMPLANT
SOAP 2 % CHG 4 OZ (WOUND CARE) ×3 IMPLANT
SUT ETHILON 3 0 PS 1 (SUTURE) ×3 IMPLANT
SUT MNCRL AB 4-0 PS2 18 (SUTURE) ×6 IMPLANT
SUT PROLENE 6 0 BV (SUTURE) IMPLANT
SUT SILK 0 TIES 10X30 (SUTURE) ×3 IMPLANT
SUT SILK 2 0 SH (SUTURE) IMPLANT
SUT VIC AB 3-0 SH 27 (SUTURE) ×6
SUT VIC AB 3-0 SH 27X BRD (SUTURE) ×4 IMPLANT
SYR 10ML LL (SYRINGE) ×3 IMPLANT
SYR 20ML LL LF (SYRINGE) ×12 IMPLANT
SYR 5ML LL (SYRINGE) ×3 IMPLANT
SYR CONTROL 10ML LL (SYRINGE) ×3 IMPLANT
SYR TOOMEY 50ML (SYRINGE) IMPLANT
TOWEL GREEN STERILE (TOWEL DISPOSABLE) ×3 IMPLANT
TOWEL GREEN STERILE FF (TOWEL DISPOSABLE) ×6 IMPLANT
UNDERPAD 30X36 HEAVY ABSORB (UNDERPADS AND DIAPERS) ×3 IMPLANT
WATER STERILE IRR 1000ML POUR (IV SOLUTION) ×3 IMPLANT

## 2021-03-13 NOTE — Patient Outreach (Signed)
[facility]) Care Management  03/13/2021  TEKEISHA HAKIM Aug 02, 1950 [id number]  Unsuccessful outreach attempt made to patient. Patient answered the phone and stated that she would not be able to speak today. She did request that this nurse call back at a later date.   Plan: RN Health Coach will call patient within the month of September.  Emelia Loron RN, BSN [place] [phone] [email or web address]

## 2021-03-13 NOTE — Transfer of Care (Signed)
Immediate Anesthesia Transfer of Care Note  Patient: Destiny Day  Procedure(s) Performed: INSERTION OF DIALYSIS CATHETER (Left: Chest)  Patient Location: PACU  Anesthesia Type:MAC  Level of Consciousness: awake, patient cooperative and responds to stimulation  Airway & Oxygen Therapy: Patient Spontanous Breathing and Patient connected to nasal cannula oxygen  Post-op Assessment: Report given to RN and Post -op Vital signs reviewed and stable  Post vital signs: Reviewed and stable  Last Vitals:  Vitals Value Taken Time  BP    Temp    Pulse 84 03/13/21 1301  Resp 22 03/13/21 1301  SpO2 99 % 03/13/21 1301  Vitals shown include unvalidated device data.  Last Pain:  Vitals:   03/13/21 1059  PainSc: 6       Patients Stated Pain Goal: 3 (20/60/15 6153)  Complications: No notable events documented.

## 2021-03-13 NOTE — Progress Notes (Signed)
    I further discussed the procedure with the patient.  Unfortunately her skin on her left upper arm looks extremely irritated with a small ulceration likely from tape.  There is skin discoloration.  I am concerned that any operation will fail to heal and could lead to secondary infection.  For this reason we will place tunneled dialysis catheter allow the skin to rest and reevaluate in 3 to 4 weeks for permanent access operation.  I discussed this extensively with the patient who agrees to proceed.  Sakoya Win C. Donzetta Matters, MD [facility] [place] Office: [phone] Pager: [phone]

## 2021-03-13 NOTE — Anesthesia Postprocedure Evaluation (Signed)
Anesthesia Post Note  Patient: Destiny Day  Procedure(s) Performed: INSERTION OF DIALYSIS CATHETER (Left: Chest)     Patient location during evaluation: PACU Anesthesia Type: MAC Level of consciousness: awake and alert Pain management: pain level controlled Vital Signs Assessment: post-procedure vital signs reviewed and stable Respiratory status: spontaneous breathing, nonlabored ventilation, respiratory function stable and patient connected to nasal cannula oxygen Cardiovascular status: stable and blood pressure returned to baseline Postop Assessment: no apparent nausea or vomiting Anesthetic complications: no   No notable events documented.  Last Vitals:  Vitals:   03/13/21 1315 03/13/21 1330  BP: (!) 123/27   Pulse: 78 81  Resp: 14 15  Temp:  (!) 36.3 C  SpO2: 96% 99%    Last Pain:  Vitals:   03/13/21 1330  PainSc: 0-No pain                 Belenda Cruise P Jabriel Vanduyne

## 2021-03-13 NOTE — Interval H&P Note (Signed)
History and Physical Interval Note:  03/13/2021 10:38 AM  Destiny Day  has presented today for surgery, with the diagnosis of ESRD.  The various methods of treatment have been discussed with the patient and family. After consideration of risks, benefits and other options for treatment, the patient has consented to  Procedure(s): INSERTION OF LEFT FOREARM ARTERIOVENOUS (AV) GORE-TEX GRAFT (Left) INSERTION OF DIALYSIS CATHETER (N/A) LIGATION OF LEFT UPPER ARM ARTERIOVENOUS GORTEX GRAFT (Left) as a surgical intervention.  The patient's history has been reviewed, patient examined, no change in status, stable for surgery.  I have reviewed the patient's chart and labs.  Questions were answered to the patient's satisfaction.     [name]

## 2021-03-13 NOTE — Op Note (Signed)
    Patient name: Destiny Day MRN: [id number] DOB: [birth year] Sex: female  03/13/2021 Pre-operative Diagnosis: esrd, malfunction left arm av graft Post-operative diagnosis:  Same Surgeon:  Eda Paschal. Donzetta Matters, MD Procedure Performed: 19 cm left IJ tunneled dialysis catheter placement with ultrasound and fluoroscopic guidance.  Indications: 70 year old female with history of end-stage renal disease.  She currently dialyzes via left upper arm AV graft which causes significant pain.  She does have some skin changes in the left upper extremity.  She is indicated for catheter to allow the arm to rest  Findings: The right IJ appeared occluded.  Left IJ catheter was placed terminating at the SVC atrial junction under ultrasound and fluoroscopic guidance.  Will allow the left arm to rest and heal and consider alternative upper extremity access in 3 to 4 weeks.   Procedure:  The patient was identified in the holding area and taken to the operating room where she was put supine operative and MAC anesthesia was induced.  She was sterilely prepped and draped bilateral neck and chest areas.  Timeout was called and antibiotics were administered.  Ultrasound was used to identify first the right IJ which appeared quite diminutive and sclerotic.  We then used the ultrasound to identify left IJ which was large and compressible.  The areas anesthetized with 1% lidocaine including the expected tunnel tract.  Left IJ was cannulated with 18-gauge needle and a wire was passed centrally under fluoroscopic guidance.  The wire tract was serially dilated.  19 cm catheter was tunneled from what counterincision.  The introducer sheath was placed and fluoroscopic guidance the catheter was placed to the SVC atrial junction.  Catheter was affixed to the skin with 3-0 nylon suture flushed with heparinized saline.  The neck incision was closed.  Dermabond is placed at both sites.  A sterile dressing was placed.  The catheter was left  1.6 cc of concentrated heparin.  Patient was awakened from anesthesia having tolerated without any complication all counts were correct at completion.  Chest x-ray will be obtained in PACU.  EBL: 10 cc  Kalem Rockwell C. Donzetta Matters, MD [facility] [place] Office: [phone] Pager: [phone]

## 2021-03-13 NOTE — Anesthesia Procedure Notes (Signed)
Procedure Name: MAC Date/Time: 03/13/2021 12:10 PM Performed by: Michele Rockers, CRNA Pre-anesthesia Checklist: Patient identified, Emergency Drugs available, Suction available, Timeout performed and Patient being monitored Patient Re-evaluated:Patient Re-evaluated prior to induction Oxygen Delivery Method: Nasal Cannula

## 2021-03-14 ENCOUNTER — Encounter (HOSPITAL_COMMUNITY): Payer: Self-pay | Admitting: Vascular Surgery

## 2021-03-14 DIAGNOSIS — Z992 Dependence on renal dialysis: Secondary | ICD-10-CM | POA: Diagnosis not present

## 2021-03-14 DIAGNOSIS — N2581 Secondary hyperparathyroidism of renal origin: Secondary | ICD-10-CM | POA: Diagnosis not present

## 2021-03-14 DIAGNOSIS — D509 Iron deficiency anemia, unspecified: Secondary | ICD-10-CM | POA: Diagnosis not present

## 2021-03-14 DIAGNOSIS — N186 End stage renal disease: Secondary | ICD-10-CM | POA: Diagnosis not present

## 2021-03-14 DIAGNOSIS — D631 Anemia in chronic kidney disease: Secondary | ICD-10-CM | POA: Diagnosis not present

## 2021-03-14 DIAGNOSIS — E876 Hypokalemia: Secondary | ICD-10-CM | POA: Diagnosis not present

## 2021-03-15 DIAGNOSIS — E876 Hypokalemia: Secondary | ICD-10-CM | POA: Diagnosis not present

## 2021-03-15 DIAGNOSIS — N2581 Secondary hyperparathyroidism of renal origin: Secondary | ICD-10-CM | POA: Diagnosis not present

## 2021-03-15 DIAGNOSIS — D631 Anemia in chronic kidney disease: Secondary | ICD-10-CM | POA: Diagnosis not present

## 2021-03-15 DIAGNOSIS — N186 End stage renal disease: Secondary | ICD-10-CM | POA: Diagnosis not present

## 2021-03-15 DIAGNOSIS — Z992 Dependence on renal dialysis: Secondary | ICD-10-CM | POA: Diagnosis not present

## 2021-03-15 DIAGNOSIS — D509 Iron deficiency anemia, unspecified: Secondary | ICD-10-CM | POA: Diagnosis not present

## 2021-03-17 DIAGNOSIS — Z992 Dependence on renal dialysis: Secondary | ICD-10-CM | POA: Diagnosis not present

## 2021-03-17 DIAGNOSIS — E876 Hypokalemia: Secondary | ICD-10-CM | POA: Diagnosis not present

## 2021-03-17 DIAGNOSIS — D509 Iron deficiency anemia, unspecified: Secondary | ICD-10-CM | POA: Diagnosis not present

## 2021-03-17 DIAGNOSIS — D631 Anemia in chronic kidney disease: Secondary | ICD-10-CM | POA: Diagnosis not present

## 2021-03-17 DIAGNOSIS — N186 End stage renal disease: Secondary | ICD-10-CM | POA: Diagnosis not present

## 2021-03-17 DIAGNOSIS — N2581 Secondary hyperparathyroidism of renal origin: Secondary | ICD-10-CM | POA: Diagnosis not present

## 2021-03-18 DIAGNOSIS — M810 Age-related osteoporosis without current pathological fracture: Secondary | ICD-10-CM | POA: Diagnosis not present

## 2021-03-18 DIAGNOSIS — Z780 Asymptomatic menopausal state: Secondary | ICD-10-CM | POA: Diagnosis not present

## 2021-03-18 DIAGNOSIS — Z1231 Encounter for screening mammogram for malignant neoplasm of breast: Secondary | ICD-10-CM | POA: Diagnosis not present

## 2021-03-19 DIAGNOSIS — N186 End stage renal disease: Secondary | ICD-10-CM | POA: Diagnosis not present

## 2021-03-19 DIAGNOSIS — Z992 Dependence on renal dialysis: Secondary | ICD-10-CM | POA: Diagnosis not present

## 2021-03-19 DIAGNOSIS — D631 Anemia in chronic kidney disease: Secondary | ICD-10-CM | POA: Diagnosis not present

## 2021-03-19 DIAGNOSIS — N2581 Secondary hyperparathyroidism of renal origin: Secondary | ICD-10-CM | POA: Diagnosis not present

## 2021-03-19 DIAGNOSIS — D509 Iron deficiency anemia, unspecified: Secondary | ICD-10-CM | POA: Diagnosis not present

## 2021-03-19 DIAGNOSIS — E876 Hypokalemia: Secondary | ICD-10-CM | POA: Diagnosis not present

## 2021-03-21 DIAGNOSIS — N186 End stage renal disease: Secondary | ICD-10-CM | POA: Diagnosis not present

## 2021-03-21 DIAGNOSIS — D631 Anemia in chronic kidney disease: Secondary | ICD-10-CM | POA: Diagnosis not present

## 2021-03-21 DIAGNOSIS — D509 Iron deficiency anemia, unspecified: Secondary | ICD-10-CM | POA: Diagnosis not present

## 2021-03-21 DIAGNOSIS — Z992 Dependence on renal dialysis: Secondary | ICD-10-CM | POA: Diagnosis not present

## 2021-03-21 DIAGNOSIS — N2581 Secondary hyperparathyroidism of renal origin: Secondary | ICD-10-CM | POA: Diagnosis not present

## 2021-03-21 DIAGNOSIS — E876 Hypokalemia: Secondary | ICD-10-CM | POA: Diagnosis not present

## 2021-03-24 DIAGNOSIS — D509 Iron deficiency anemia, unspecified: Secondary | ICD-10-CM | POA: Diagnosis not present

## 2021-03-24 DIAGNOSIS — Z992 Dependence on renal dialysis: Secondary | ICD-10-CM | POA: Diagnosis not present

## 2021-03-24 DIAGNOSIS — N186 End stage renal disease: Secondary | ICD-10-CM | POA: Diagnosis not present

## 2021-03-24 DIAGNOSIS — N2581 Secondary hyperparathyroidism of renal origin: Secondary | ICD-10-CM | POA: Diagnosis not present

## 2021-03-24 DIAGNOSIS — E876 Hypokalemia: Secondary | ICD-10-CM | POA: Diagnosis not present

## 2021-03-24 DIAGNOSIS — D631 Anemia in chronic kidney disease: Secondary | ICD-10-CM | POA: Diagnosis not present

## 2021-03-25 ENCOUNTER — Ambulatory Visit (INDEPENDENT_AMBULATORY_CARE_PROVIDER_SITE_OTHER): Payer: Medicare Other | Admitting: *Deleted

## 2021-03-25 DIAGNOSIS — J309 Allergic rhinitis, unspecified: Secondary | ICD-10-CM | POA: Diagnosis not present

## 2021-03-26 DIAGNOSIS — N2581 Secondary hyperparathyroidism of renal origin: Secondary | ICD-10-CM | POA: Diagnosis not present

## 2021-03-26 DIAGNOSIS — N186 End stage renal disease: Secondary | ICD-10-CM | POA: Diagnosis not present

## 2021-03-26 DIAGNOSIS — D509 Iron deficiency anemia, unspecified: Secondary | ICD-10-CM | POA: Diagnosis not present

## 2021-03-26 DIAGNOSIS — Z992 Dependence on renal dialysis: Secondary | ICD-10-CM | POA: Diagnosis not present

## 2021-03-26 DIAGNOSIS — D631 Anemia in chronic kidney disease: Secondary | ICD-10-CM | POA: Diagnosis not present

## 2021-03-26 DIAGNOSIS — E876 Hypokalemia: Secondary | ICD-10-CM | POA: Diagnosis not present

## 2021-03-27 ENCOUNTER — Other Ambulatory Visit: Payer: Self-pay

## 2021-03-27 ENCOUNTER — Ambulatory Visit (INDEPENDENT_AMBULATORY_CARE_PROVIDER_SITE_OTHER): Payer: Medicare Other | Admitting: Ophthalmology

## 2021-03-27 ENCOUNTER — Encounter (INDEPENDENT_AMBULATORY_CARE_PROVIDER_SITE_OTHER): Payer: Self-pay | Admitting: Ophthalmology

## 2021-03-27 DIAGNOSIS — I159 Secondary hypertension, unspecified: Secondary | ICD-10-CM | POA: Diagnosis not present

## 2021-03-27 DIAGNOSIS — E113512 Type 2 diabetes mellitus with proliferative diabetic retinopathy with macular edema, left eye: Secondary | ICD-10-CM

## 2021-03-27 DIAGNOSIS — E113521 Type 2 diabetes mellitus with proliferative diabetic retinopathy with traction retinal detachment involving the macula, right eye: Secondary | ICD-10-CM | POA: Diagnosis not present

## 2021-03-27 DIAGNOSIS — Z992 Dependence on renal dialysis: Secondary | ICD-10-CM | POA: Diagnosis not present

## 2021-03-27 DIAGNOSIS — Z794 Long term (current) use of insulin: Secondary | ICD-10-CM | POA: Diagnosis not present

## 2021-03-27 DIAGNOSIS — IMO0002 Reserved for concepts with insufficient information to code with codable children: Secondary | ICD-10-CM

## 2021-03-27 DIAGNOSIS — E113511 Type 2 diabetes mellitus with proliferative diabetic retinopathy with macular edema, right eye: Secondary | ICD-10-CM | POA: Diagnosis not present

## 2021-03-27 DIAGNOSIS — E1165 Type 2 diabetes mellitus with hyperglycemia: Secondary | ICD-10-CM

## 2021-03-27 DIAGNOSIS — N186 End stage renal disease: Secondary | ICD-10-CM | POA: Diagnosis not present

## 2021-03-27 MED ORDER — BEVACIZUMAB 2.5 MG/0.1ML IZ SOSY
2.5000 mg | PREFILLED_SYRINGE | INTRAVITREAL | Status: AC | PRN
  Administered 2021-03-27: 2.5 mg via INTRAVITREAL

## 2021-03-27 NOTE — Assessment & Plan Note (Signed)
OD now is post PRP will need vitrectomy membrane peel in the right eye local MAC surgery in the near future.

## 2021-03-27 NOTE — Assessment & Plan Note (Signed)
OS with chronic CSME will need intravitreal Avastin OS in the today

## 2021-03-27 NOTE — Progress Notes (Signed)
03/27/2021     CHIEF COMPLAINT Patient presents for  Chief Complaint  Patient presents with   Retina Follow Up    1 week prp OD Pt states VA OU stable since last visit. Pt denies FOL, floaters, or ocular pain OU.  Pt states, "I feel like my vision is pretty good. I am not seeing anymore floaters." A1C:5.6 LBS:135      HISTORY OF PRESENT ILLNESS: NOREENE Day is a 70 y.o. female who presents to the clinic today for:   HPI     Retina Follow Up   Patient presents with  Diabetic Retinopathy.  In right eye.  This started 9 weeks ago.  Severity is mild.  Duration of 9 weeks.  Since onset it is stable. Additional comments: 1 week prp OD Pt states VA OU stable since last visit. Pt denies FOL, floaters, or ocular pain OU.  Pt states, "I feel like my vision is pretty good. I am not seeing anymore floaters." A1C:5.6 LBS:135        Comments   9 week fu ou fp Patient states vision is stable and unchanged since last visit. Denies new FOL. Pt states she notices a floater like a scraggly hair. It went away and then came back.  BS: not checked this morning, LBS 132 last week. A1C: 6.7      Last edited by Laurin Coder on 03/27/2021 11:07 AM.      Referring physician: Cipriano Mile, NP [place],  [place] [id number]  HISTORICAL INFORMATION:   Selected notes from the MEDICAL RECORD NUMBER    Lab Results  Component Value Date   HGBA1C 5.6 06/26/2019     CURRENT MEDICATIONS: No current outpatient medications on file. (Ophthalmic Drugs)   No current facility-administered medications for this visit. (Ophthalmic Drugs)   Current Outpatient Medications (Other)  Medication Sig   albuterol (PROAIR HFA) 108 (90 Base) MCG/ACT inhaler Inhale 2 puffs into the lungs every 4 (four) hours as needed for wheezing or shortness of breath.   amLODipine (NORVASC) 10 MG tablet Take 10 mg by mouth at bedtime.   aspirin EC 81 MG tablet Take 81 mg by mouth daily.    atorvastatin (LIPITOR) 40 MG tablet Take 1 tablet (40 mg total) by mouth daily.   busPIRone (BUSPAR) 7.5 MG tablet Take 7.5 mg by mouth 2 (two) times daily.   carvedilol (COREG) 12.5 MG tablet Take 12.5 mg by mouth See admin instructions. Take 1 tablet (12.5 mg) by mouth once daily in the evening on Mondays, Wednesdays, & Fridays. (Dialysis) Take 1 tablet (12.5 mg) by mouth twice daily on Sundays, Tuesdays, Thursdays, & Saturdays. (Non Dialysis)   cinacalcet (SENSIPAR) 60 MG tablet Take 90 mg by mouth See admin instructions. Take 1 tablet (90 mg) by mouth in the evening on Mondays, Wednesdays, & Fridays (dialysis) Take 1 tablet (90 mg) by mouth twice daily on Sundays, Tuesdays, Thursdays, Saturdays. (non dialysis)   docusate sodium (COLACE) 100 MG capsule Take 1 capsule (100 mg total) by mouth 2 (two) times daily.   EPINEPHrine (AUVI-Q) 0.3 mg/0.3 mL IJ SOAJ injection Inject 0.3 mg into the muscle as needed for anaphylaxis.   ethyl chloride spray SMARTSIG:1 Spray(s) Topical 3 Times a Week   famotidine (PEPCID) 10 MG tablet Take 1 tablet (10 mg total) by mouth every other day.   ferric citrate (AURYXIA) 1 GM 210 MG(Fe) tablet Take 210-420 mg by mouth See admin instructions. Take 2 tablets (420  mg) by mouth with each meal & take 1 tablet (210 mg) by mouth with snacks.   fluticasone (FLONASE) 50 MCG/ACT nasal spray Place 2 sprays into both nostrils daily as needed for allergies or rhinitis.   fluticasone-salmeterol (ADVAIR HFA) 115-21 MCG/ACT inhaler Inhale two puffs twice daily   gabapentin (NEURONTIN) 100 MG capsule TAKE 1 CAPSULE (100 MG TOTAL) BY MOUTH AT BEDTIME. WHEN NECESSARY FOR NEUROPATHY PAIN (Patient taking differently: Take 100 mg by mouth at bedtime as needed (pain). When necessary for neuropathy pain)   glipiZIDE (GLUCOTROL) 5 MG tablet Take 5 mg by mouth daily before breakfast.   ipratropium (ATROVENT) 0.06 % nasal spray Place 2 sprays into both nostrils 4 (four) times daily.   Isosorb  Dinitrate-hydrALAZINE (BIDIL PO) Take by mouth.   isosorbide-hydrALAZINE (BIDIL) 20-37.5 MG tablet Take 1 tablet by mouth 3 (three) times daily. BID on days of dialysis   lidocaine-prilocaine (EMLA) cream Apply 1 application topically 3 (three) times a week. 1-2 hours prior to dialysis   montelukast (SINGULAIR) 10 MG tablet TAKE 1 TABLET BY MOUTH EVERYDAY AT BEDTIME   multivitamin (RENA-VIT) TABS tablet Take 1 tablet by mouth daily.    omeprazole (PRILOSEC) 20 MG capsule Take 20 mg by mouth daily.    ondansetron (ZOFRAN) 4 MG tablet Take 1 tablet (4 mg total) by mouth every 6 (six) hours as needed for nausea.   polyethylene glycol (MIRALAX / GLYCOLAX) 17 g packet Take 17 g by mouth daily.   Spacer/Aero-Holding Chambers (AEROCHAMBER PLUS) inhaler Use with inhaler   traZODone (DESYREL) 50 MG tablet Take 50 mg by mouth at bedtime as needed for sleep.   Vitamin D, Ergocalciferol, (DRISDOL) 1.25 MG (50000 UT) CAPS capsule Take 50,000 Units by mouth every Sunday. Takes on Sunday   No current facility-administered medications for this visit. (Other)      REVIEW OF SYSTEMS:    ALLERGIES Allergies  Allergen Reactions   Adhesive  [Tape] Hives   Lisinopril Cough   Prednisone Swelling and Other (See Comments)    Excessive fluid buildup    PAST MEDICAL HISTORY Past Medical History:  Diagnosis Date   Abdominal bruit    Anxiety    Arthritis    Osteoarthritis   Asthma    Cervical disc disease    "pinced nerve"   CHF (congestive heart failure) (HCC)    Diabetes mellitus    Type II   Diverticulitis    ESRD (end stage renal disease) ([facility])    dialysis - M/W/F- [place]   GERD (gastroesophageal reflux disease)    from medications   GI bleed 03/31/2013   Head injury 07/2017   History of hiatal hernia    Hyperlipidemia    Hypertension    Neuropathy    left leg   Osteoporosis    Peripheral vascular disease ([place])    Pneumonia    "very young" and a few years ago   Seasonal allergies     Shortness of breath dyspnea    WIth exertion, when fluid builds   Sleep apnea    can't afford cpap    Past Surgical History:  Procedure Laterality Date   A/V SHUNTOGRAM N/A 09/22/2016   Procedure: A/V Shuntogram - left arm;  Surgeon: Serafina Mitchell, MD;  Location: [facility];  Service: Cardiovascular;  Laterality: N/A;   A/V SHUNTOGRAM N/A 03/22/2018   Procedure: A/V SHUNTOGRAM - left arm;  Surgeon: Serafina Mitchell, MD;  Location: [facility];  Service:  Cardiovascular;  Laterality: N/A;   A/V SHUNTOGRAM Left 11/17/2018   Procedure: A/V SHUNTOGRAM;  Surgeon: Angelia Mould, MD;  Location: [facility];  Service: Cardiovascular;  Laterality: Left;   ABDOMINAL HYSTERECTOMY  1993`   AMPUTATION Left 09/01/2016   Procedure: LEFT FOOT TRANSMETATARSAL AMPUTATION;  Surgeon: Newt Minion, MD;  Location: [facility];  Service: Orthopedics;  Laterality: Left;   AMPUTATION Right 08/11/2017   Procedure: RIGHT GREAT TOE AMPUTATION DIGIT;  Surgeon: Rosetta Posner, MD;  Location: [place];  Service: Vascular;  Laterality: Right;   AMPUTATION Right 12/31/2017   Procedure: RIGHT TRANSMETATARSAL AMPUTATION;  Surgeon: Newt Minion, MD;  Location: [facility];  Service: Orthopedics;  Laterality: Right;   AV FISTULA PLACEMENT Left 04/21/2016   Procedure: INSERTION OF ARTERIOVENOUS (AV) GORE-TEX GRAFT ARM LEFT;  Surgeon: Elam Dutch, MD;  Location: [facility];  Service: Vascular;  Laterality: Left;   BASCILIC VEIN TRANSPOSITION Left 07/10/2014   Procedure: BASCILIC VEIN TRANSPOSITION;  Surgeon: Angelia Mould, MD;  Location: [facility];  Service: Vascular;  Laterality: Left;   [place] Right 11/08/2014   Procedure: FIRST STAGE BASILIC VEIN TRANSPOSITION;  Surgeon: Angelia Mould, MD;  Location: [facility];  Service: Vascular;  Laterality: Right;   [place] Right 01/18/2015   Procedure: SECOND STAGE BASILIC VEIN TRANSPOSITION;  Surgeon: Angelia Mould, MD;   Location: [facility];  Service: Vascular;  Laterality: Right;   CRANIOTOMY N/A 08/23/2017   Procedure: CRANIOTOMY HEMATOMA EVACUATION SUBDURAL;  Surgeon: Ashok Pall, MD;  Location: [facility];  Service: Neurosurgery;  Laterality: N/A;   CRANIOTOMY Left 08/24/2017   Procedure: CRANIOTOMY FOR RECURRENT ACUTE SUBDURAL HEMATOMA;  Surgeon: Ashok Pall, MD;  Location: [place];  Service: Neurosurgery;  Laterality: Left;   ESOPHAGOGASTRODUODENOSCOPY N/A 03/31/2013   Procedure: ESOPHAGOGASTRODUODENOSCOPY (EGD);  Surgeon: Gatha Mayer, MD;  Location: [facility];  Service: Endoscopy;  Laterality: N/A;   EYE SURGERY     laser surgery   FEMORAL-POPLITEAL BYPASS GRAFT Left 07/08/2016   Procedure: LEFT  FEMORAL-BELOW KNEE POPLITEAL ARTERY BYPASS GRAFT USING 6MM X 80 CM PROPATEN GORETEX GRAFT WITH RINGS.;  Surgeon: Rosetta Posner, MD;  Location: [facility];  Service: Vascular;  Laterality: Left;   FEMORAL-POPLITEAL BYPASS GRAFT Right 08/11/2017   Procedure: RIGHT FEMORAL TO BELOW KNEE POPLITEAL ARTERKY  BYPASS GRAFT USING 6MM RINGED PROPATEN GRAFT;  Surgeon: Rosetta Posner, MD;  Location: [facility];  Service: Vascular;  Laterality: Right;   FISTULOGRAM Left 10/29/2014   Procedure: FISTULOGRAM;  Surgeon: Angelia Mould, MD;  Location: [facility];  Service: Cardiovascular;  Laterality: Left;   GAS/FLUID EXCHANGE Left 12/20/2018   Procedure: AIR GAS EXCHANGE;  Surgeon: Jalene Mullet, MD;  Location: [place];  Service: Ophthalmology;  Laterality: Left;   INSERTION OF DIALYSIS CATHETER N/A 09/05/2020   Procedure: INSERTION OF DIALYSIS CATHETER;  Surgeon: Cherre Robins, MD;  Location: [place];  Service: Vascular;  Laterality: N/A;   INSERTION OF DIALYSIS CATHETER Left 03/13/2021   Procedure: INSERTION OF DIALYSIS CATHETER;  Surgeon: Waynetta Sandy, MD;  Location: [facility];  Service: Vascular;  Laterality: Left;   KNEE ARTHROSCOPY Right 05/06/2018   Procedure: RIGHT KNEE ARTHROSCOPY;  Surgeon: Newt Minion, MD;  Location:  [facility];  Service: Orthopedics;  Laterality: Right;   KNEE ARTHROSCOPY Right 06/10/2018   Procedure: RIGHT KNEE ARTHROSCOPY AND DEBRIDEMENT;  Surgeon: Newt Minion, MD;  Location: [facility];  Service: Orthopedics;  Laterality: Right;  LIGATION OF ARTERIOVENOUS  FISTULA Left 04/21/2016   Procedure: LIGATION OF ARTERIOVENOUS  FISTULA LEFT ARM;  Surgeon: Elam Dutch, MD;  Location: [facility];  Service: Vascular;  Laterality: Left;   LOWER EXTREMITY ANGIOGRAPHY N/A 04/06/2017   Procedure: Lower Extremity Angiography - Right;  Surgeon: Serafina Mitchell, MD;  Location: [facility];  Service: Cardiovascular;  Laterality: N/A;   MEMBRANE PEEL Left 12/20/2018   Procedure: MEMBRANE PEEL;  Surgeon: Jalene Mullet, MD;  Location: [facility];  Service: Ophthalmology;  Laterality: Left;   PATCH ANGIOPLASTY Right 01/18/2015   Procedure: BASILIC VEIN PATCH ANGIOPLASTY USING VASCUGUARD PATCH;  Surgeon: Angelia Mould, MD;  Location: [facility] [facility];  Service: Vascular;  Laterality: Right;   PERIPHERAL VASCULAR BALLOON ANGIOPLASTY  09/22/2016   Procedure: Peripheral Vascular Balloon Angioplasty;  Surgeon: Serafina Mitchell, MD;  Location: [facility];  Service: Cardiovascular;;  Lt. Fistula   PERIPHERAL VASCULAR BALLOON ANGIOPLASTY Left 03/22/2018   Procedure: PERIPHERAL VASCULAR BALLOON ANGIOPLASTY;  Surgeon: Serafina Mitchell, MD;  Location: [facility];  Service: Cardiovascular;  Laterality: Left;  Arm shunt   PERIPHERAL VASCULAR CATHETERIZATION N/A 06/23/2016   Procedure: Abdominal Aortogram w/Lower Extremity;  Surgeon: Serafina Mitchell, MD;  Location: [place];  Service: Cardiovascular;  Laterality: N/A;   PERIPHERAL VASCULAR CATHETERIZATION  06/23/2016   Procedure: Peripheral Vascular Intervention;  Surgeon: Serafina Mitchell, MD;  Location: [facility];  Service: Cardiovascular;;  lt common and external illiac artery   PHOTOCOAGULATION WITH LASER Left 12/20/2018   Procedure: PHOTOCOAGULATION  WITH LASER;  Surgeon: Jalene Mullet, MD;  Location: [facility];  Service: Ophthalmology;  Laterality: Left;   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   Procedure: REPAIR OF COMPLEX TRACTION RETINAL DETACHMENT;  Surgeon: Jalene Mullet, MD;  Location: [place];  Service: Ophthalmology;  Laterality: Left;   REVISION OF ARTERIOVENOUS GORETEX GRAFT Left 09/05/2020   Procedure: REVISION OF ARTERIOVENOUS GORETEX GRAFT LEFT;  Surgeon: Cherre Robins, MD;  Location[facility];  Service: Vascular;  Laterality: Left;    FAMILY HISTORY Family History  Problem Relation Age of Onset   Other Mother        not sure of cause of death   Diabetes Father    Diabetes Sister    Diabetes Sister    Pancreatic cancer Maternal Grandmother    Colon cancer Neg Hx     SOCIAL HISTORY Social History   Tobacco Use   Smoking status: Former    Packs/day: 0.35    Years: 40.00    Pack years: 14.00    Types: Cigarettes    Quit date: 05/10/2012    Years since quitting: 8.8   Smokeless tobacco: Never  Vaping Use   Vaping Use: Never used  Substance Use Topics   Alcohol use: No   Drug use: No    Comment: marijuana; quit in early 1980's         OPHTHALMIC EXAM:  Base Eye Exam     Visual Acuity (Snellen - Linear)       Right Left   Dist [place] 20/80 -2 20/80 -2   Dist ph [place] 20/70 NI         Tonometry (Tonopen, 11:12 AM)       Right Left   Pressure 18 20         Pupils       Pupils Dark Light Shape React APD   Right PERRL 3  3 Round Minimal None   Left PERRL 3 3 Round Minimal None         Visual Fields (Counting fingers)       Left Right    Full    Restrictions  Partial outer inferior nasal deficiency         Extraocular Movement       Right Left    Full Full         Neuro/Psych     Oriented x3: Yes   Mood/Affect: Normal         Dilation     Both eyes: 1.0% Mydriacyl, 2.5% Phenylephrine @ 11:12 AM           Slit Lamp and Fundus Exam      External Exam       Right Left   External Normal Normal         Slit Lamp Exam       Right Left   Lids/Lashes Normal Normal   Conjunctiva/Sclera White and quiet White and quiet   Cornea Clear Clear   Anterior Chamber Deep and quiet Deep and quiet   Iris Round and reactive Round and reactive   Lens Centered posterior chamber intraocular lens Centered posterior chamber intraocular lens   Anterior Vitreous Normal Normal, remnant         Fundus Exam       Right Left   Posterior Vitreous Pre-retinal hemorrhage, temporal macula    Disc Neovascularization    C/D Ratio 0.5    Macula Microaneurysms    Vessels PDR-active with NVE inferior to nerve NVE temporal portion of macula    Periphery  moderate scatter             IMAGING AND PROCEDURES  Imaging and Procedures for 03/27/21  Color Fundus Photography Optos - OU - Both Eyes       Right Eye Progression has been stable. Disc findings include neovascularization. Macula : exudates, epiretinal membrane.   Left Eye Progression has been stable. Disc findings include normal observations. Macula : exudates, edema.   Notes Proliferative diabetic retinopathy OD with membranes over the macula and optic nerve, quiescent yet with traction on the macular region..  Preretinal hemorrhage inferior and temporal       Intravitreal Injection, Pharmacologic Agent - OS - Left Eye       Time Out 03/27/2021. 12:02 PM. Confirmed correct patient, procedure, site, and patient consented.   Anesthesia Topical anesthesia was used. Anesthetic medications included Ak[place] 3.5%.   Procedure Preparation included Tobramycin 0.3%, 5% betadine to ocular surface, 10% betadine to eyelids. A 30 gauge needle was used.   Injection: 2.5 mg bevacizumab 2.5 MG/0.1ML   Route: Intravitreal, Site: Left Eye   NDC: [phone], Lot: [id number]   Post-op Post injection exam found visual acuity of at least counting fingers. The patient tolerated the  procedure well. There were no complications. The patient received written and verbal post procedure care education. Post injection medications were not given.              ASSESSMENT/PLAN:  Uncontrolled diabetes mellitus with right eye affected by proliferative retinopathy and traction retinal detachment involving macula, with long-term current use of insulin (HCC) OD now is post PRP will need vitrectomy membrane peel in the right eye local MAC surgery in the near future.  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 type 2 diabetes mellitus (HCC) OS with chronic CSME will need intravitreal Avastin  OS in the today     ICD-10-CM   1. 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 type 2 diabetes mellitus (HCC)  V56.4332 Color Fundus Photography Optos - OU - Both Eyes    Intravitreal Injection, Pharmacologic Agent - OS - Left Eye    bevacizumab (AVASTIN) SOSY 2.5 mg    2. Diabetic macular edema of right eye with proliferative retinopathy associated with type 2 diabetes mellitus (HCC)  R51.8841 Color Fundus Photography Optos - OU - Both Eyes    3. Uncontrolled diabetes mellitus with right eye affected by proliferative retinopathy and traction retinal detachment involving macula, with long-term current use of insulin ([place])  Y60.6301    E11.65    Z79.4       1.  OS with chronic active CSME despite further decrease vegF load with PRP June 2022.  Last injection at antivegF delivered January 02, 2021 OS.  Will need injection today  2.  OD with tractional retinal detachment involving the macula, progression despite PRP will need to have vitrectomy, membrane peel right eye local MAC in order to preserve and and potentially improve acuity OD  3.  Please note patient usually has dialysis on Monday Wednesday Fridays  Ophthalmic Meds Ordered this visit:  Meds ordered this encounter  Medications   bevacizumab (AVASTIN) SOSY  2.5 mg        Return [facility], for Schedule vitrectomy membrane peel [id number], OD,, E 11.3521.  There are no Patient Instructions on file for this visit.   Explained the diagnoses, plan, and follow up with the patient and they expressed understanding.  Patient expressed understanding of the importance of proper follow up care.   Clent Destiny Day M.D. Diseases & Surgery of the Retina and Vitreous Retina & Diabetic [place] 03/27/21     Abbreviations: M myopia (nearsighted); A astigmatism; H hyperopia (farsighted); P presbyopia; Mrx spectacle prescription;  CTL contact lenses; OD right eye; OS left eye; OU both eyes  XT exotropia; ET esotropia; PEK punctate epithelial keratitis; PEE punctate epithelial erosions; DES dry eye syndrome; MGD meibomian gland dysfunction; ATs artificial tears; PFAT's preservative free artificial tears; [place] nuclear sclerotic cataract; PSC posterior subcapsular cataract; ERM epi-retinal membrane; PVD posterior vitreous detachment; RD retinal detachment; DM diabetes mellitus; DR diabetic retinopathy; NPDR non-proliferative diabetic retinopathy; PDR proliferative diabetic retinopathy; CSME clinically significant macular edema; DME diabetic macular edema; dbh dot blot hemorrhages; CWS cotton wool spot; POAG primary open angle glaucoma; C/D cup-to-disc ratio; HVF humphrey visual field; GVF goldmann visual field; OCT optical coherence tomography; IOP intraocular pressure; BRVO Branch retinal vein occlusion; CRVO central retinal vein occlusion; CRAO central retinal artery occlusion; BRAO branch retinal artery occlusion; RT retinal tear; SB scleral buckle; PPV pars plana vitrectomy; VH Vitreous hemorrhage; PRP panretinal laser photocoagulation; IVK intravitreal kenalog; VMT vitreomacular traction; MH Macular hole;  NVD neovascularization of the disc; NVE neovascularization elsewhere; AREDS age related eye disease study; ARMD age related macular degeneration; POAG  primary open angle glaucoma; EBMD epithelial/anterior basement membrane dystrophy; ACIOL anterior chamber intraocular lens; IOL intraocular lens; PCIOL posterior chamber intraocular lens; Phaco/IOL phacoemulsification with intraocular lens placement; [place] photorefractive keratectomy; LASIK laser assisted in situ keratomileusis; HTN hypertension; DM diabetes mellitus; COPD chronic obstructive pulmonary disease

## 2021-03-28 DIAGNOSIS — D509 Iron deficiency anemia, unspecified: Secondary | ICD-10-CM | POA: Diagnosis not present

## 2021-03-28 DIAGNOSIS — T7840XA Allergy, unspecified, initial encounter: Secondary | ICD-10-CM | POA: Diagnosis not present

## 2021-03-28 DIAGNOSIS — E876 Hypokalemia: Secondary | ICD-10-CM | POA: Diagnosis not present

## 2021-03-28 DIAGNOSIS — N2581 Secondary hyperparathyroidism of renal origin: Secondary | ICD-10-CM | POA: Diagnosis not present

## 2021-03-28 DIAGNOSIS — D631 Anemia in chronic kidney disease: Secondary | ICD-10-CM | POA: Diagnosis not present

## 2021-03-28 DIAGNOSIS — Z992 Dependence on renal dialysis: Secondary | ICD-10-CM | POA: Diagnosis not present

## 2021-03-28 DIAGNOSIS — N186 End stage renal disease: Secondary | ICD-10-CM | POA: Diagnosis not present

## 2021-03-31 DIAGNOSIS — N2581 Secondary hyperparathyroidism of renal origin: Secondary | ICD-10-CM | POA: Diagnosis not present

## 2021-03-31 DIAGNOSIS — T7840XA Allergy, unspecified, initial encounter: Secondary | ICD-10-CM | POA: Diagnosis not present

## 2021-03-31 DIAGNOSIS — D509 Iron deficiency anemia, unspecified: Secondary | ICD-10-CM | POA: Diagnosis not present

## 2021-03-31 DIAGNOSIS — Z992 Dependence on renal dialysis: Secondary | ICD-10-CM | POA: Diagnosis not present

## 2021-03-31 DIAGNOSIS — D631 Anemia in chronic kidney disease: Secondary | ICD-10-CM | POA: Diagnosis not present

## 2021-03-31 DIAGNOSIS — N186 End stage renal disease: Secondary | ICD-10-CM | POA: Diagnosis not present

## 2021-04-02 ENCOUNTER — Encounter: Payer: Self-pay | Admitting: Vascular Surgery

## 2021-04-02 ENCOUNTER — Ambulatory Visit (INDEPENDENT_AMBULATORY_CARE_PROVIDER_SITE_OTHER): Payer: Medicare Other | Admitting: Vascular Surgery

## 2021-04-02 ENCOUNTER — Other Ambulatory Visit: Payer: Self-pay

## 2021-04-02 VITALS — BP 176/82 | HR 57 | Temp 97.9°F | Resp 20 | Ht 65.0 in | Wt 148.0 lb

## 2021-04-02 DIAGNOSIS — Z992 Dependence on renal dialysis: Secondary | ICD-10-CM | POA: Diagnosis not present

## 2021-04-02 DIAGNOSIS — N186 End stage renal disease: Secondary | ICD-10-CM

## 2021-04-02 DIAGNOSIS — N2581 Secondary hyperparathyroidism of renal origin: Secondary | ICD-10-CM | POA: Diagnosis not present

## 2021-04-02 DIAGNOSIS — D509 Iron deficiency anemia, unspecified: Secondary | ICD-10-CM | POA: Diagnosis not present

## 2021-04-02 DIAGNOSIS — T7840XA Allergy, unspecified, initial encounter: Secondary | ICD-10-CM | POA: Diagnosis not present

## 2021-04-02 DIAGNOSIS — D631 Anemia in chronic kidney disease: Secondary | ICD-10-CM | POA: Diagnosis not present

## 2021-04-02 NOTE — Progress Notes (Signed)
Patient ID: Destiny Day, female   DOB: [birth year], 70 y.o.   MRN: [id number]  Reason for Consult: Routine Post Op   Referred by Cipriano Mile, NP  Subjective:     HPI:  Destiny Day is a 70 y.o. female history of end-stage renal disease currently dialyzing via catheter.  She was having pain in her left arm AV graft.  This was revised as recently as February.  She is using Emla cream and spray but was having significant skin irritation.  We elected for catheter placement to rest the arm.  She now states that her pain has resolved her skin has mostly normalized except for some lack of pigmentation she states that she will be ready for cannulation of the graft soon.  Past Medical History:  Diagnosis Date   Abdominal bruit    Anxiety    Arthritis    Osteoarthritis   Asthma    Cervical disc disease    "pinced nerve"   CHF (congestive heart failure) (HCC)    Diabetes mellitus    Type II   Diverticulitis    ESRD (end stage renal disease) ([place])    dialysis - M/W/F- [place]   GERD (gastroesophageal reflux disease)    from medications   GI bleed 03/31/2013   Head injury 07/2017   History of hiatal hernia    Hyperlipidemia    Hypertension    Neuropathy    left leg   Osteoporosis    Peripheral vascular disease ([place])    Pneumonia    "very young" and a few years ago   Seasonal allergies    Shortness of breath dyspnea    WIth exertion, when fluid builds   Sleep apnea    can't afford cpap    Family History  Problem Relation Age of Onset   Other Mother        not sure of cause of death   Diabetes Father    Diabetes Sister    Diabetes Sister    Pancreatic cancer Maternal Grandmother    Colon cancer Neg Hx    Past Surgical History:  Procedure Laterality Date   A/V SHUNTOGRAM N/A 09/22/2016   Procedure: A/V Shuntogram - left arm;  Surgeon: Serafina Mitchell, MD;  Location: [facility];  Service: Cardiovascular;  Laterality: N/A;   A/V SHUNTOGRAM N/A 03/22/2018    Procedure: A/V SHUNTOGRAM - left arm;  Surgeon: Serafina Mitchell, MD;  Location: [facility];  Service: Cardiovascular;  Laterality: N/A;   A/V SHUNTOGRAM Left 11/17/2018   Procedure: A/V SHUNTOGRAM;  Surgeon: Angelia Mould, MD;  Location: [facility];  Service: Cardiovascular;  Laterality: Left;   ABDOMINAL HYSTERECTOMY  1993`   AMPUTATION Left 09/01/2016   Procedure: LEFT FOOT TRANSMETATARSAL AMPUTATION;  Surgeon: Newt Minion, MD;  Location: [place];  Service: Orthopedics;  Laterality: Left;   AMPUTATION Right 08/11/2017   Procedure: RIGHT GREAT TOE AMPUTATION DIGIT;  Surgeon: Rosetta Posner, MD;  Location: [place];  Service: Vascular;  Laterality: Right;   AMPUTATION Right 12/31/2017   Procedure: RIGHT TRANSMETATARSAL AMPUTATION;  Surgeon: Newt Minion, MD;  Location: [place];  Service: Orthopedics;  Laterality: Right;   AV FISTULA PLACEMENT Left 04/21/2016   Procedure: INSERTION OF ARTERIOVENOUS (AV) GORE-TEX GRAFT ARM LEFT;  Surgeon: Elam Dutch, MD;  Location: [place];  Service: Vascular;  Laterality: Left;   [place] Left 07/10/2014   Procedure: [place];  Surgeon: Angelia Mould, MD;  Location: [place];  Service: Vascular;  Laterality: Left;   [place] Right 11/08/2014   Procedure: FIRST STAGE BASILIC VEIN TRANSPOSITION;  Surgeon: Angelia Mould, MD;  Location: [place];  Service: Vascular;  Laterality: Right;   [place] Right 01/18/2015   Procedure: SECOND STAGE BASILIC VEIN TRANSPOSITION;  Surgeon: Angelia Mould, MD;  Location: [place];  Service: Vascular;  Laterality: Right;   CRANIOTOMY N/A 08/23/2017   Procedure: CRANIOTOMY HEMATOMA EVACUATION SUBDURAL;  Surgeon: Ashok Pall, MD;  Location: [place];  Service: Neurosurgery;  Laterality: N/A;   CRANIOTOMY Left 08/24/2017   Procedure: CRANIOTOMY FOR RECURRENT ACUTE SUBDURAL HEMATOMA;  Surgeon: Ashok Pall, MD;  Location: [place];   Service: Neurosurgery;  Laterality: Left;   ESOPHAGOGASTRODUODENOSCOPY N/A 03/31/2013   Procedure: ESOPHAGOGASTRODUODENOSCOPY (EGD);  Surgeon: Gatha Mayer, MD;  Location: [facility]OSCOPY;  Service: Endoscopy;  Laterality: N/A;   EYE SURGERY     laser surgery   FEMORAL-POPLITEAL BYPASS GRAFT Left 07/08/2016   Procedure: LEFT  FEMORAL-BELOW KNEE POPLITEAL ARTERY BYPASS GRAFT USING 6MM X 80 CM PROPATEN GORETEX GRAFT WITH RINGS.;  Surgeon: Rosetta Posner, MD;  Location: [facility];  Service: Vascular;  Laterality: Left;   FEMORAL-POPLITEAL BYPASS GRAFT Right 08/11/2017   Procedure: RIGHT FEMORAL TO BELOW KNEE POPLITEAL ARTERKY  BYPASS GRAFT USING 6MM RINGED PROPATEN GRAFT;  Surgeon: Rosetta Posner, MD;  Location: [place];  Service: Vascular;  Laterality: Right;   FISTULOGRAM Left 10/29/2014   Procedure: FISTULOGRAM;  Surgeon: Angelia Mould, MD;  Location: [facility]ATH LAB;  Service: Cardiovascular;  Laterality: Left;   GAS/FLUID EXCHANGE Left 12/20/2018   Procedure: AIR GAS EXCHANGE;  Surgeon: Jalene Mullet, MD;  Location: [place];  Service: Ophthalmology;  Laterality: Left;   INSERTION OF DIALYSIS CATHETER N/A 09/05/2020   Procedure: INSERTION OF DIALYSIS CATHETER;  Surgeon: Cherre Robins, MD;  Location: [place];  Service: Vascular;  Laterality: N/A;   INSERTION OF DIALYSIS CATHETER Left 03/13/2021   Procedure: INSERTION OF DIALYSIS CATHETER;  Surgeon: Waynetta Sandy, MD;  Location: [place];  Service: Vascular;  Laterality: Left;   KNEE ARTHROSCOPY Right 05/06/2018   Procedure: RIGHT KNEE ARTHROSCOPY;  Surgeon: Newt Minion, MD;  Location: [place];  Service: Orthopedics;  Laterality: Right;   KNEE ARTHROSCOPY Right 06/10/2018   Procedure: RIGHT KNEE ARTHROSCOPY AND DEBRIDEMENT;  Surgeon: Newt Minion, MD;  Location: [place];  Service: Orthopedics;  Laterality: Right;   LIGATION OF ARTERIOVENOUS  FISTULA Left 04/21/2016   Procedure: LIGATION OF ARTERIOVENOUS  FISTULA LEFT ARM;  Surgeon: Elam Dutch,  MD;  Location: [place];  Service: Vascular;  Laterality: Left;   LOWER EXTREMITY ANGIOGRAPHY N/A 04/06/2017   Procedure: Lower Extremity Angiography - Right;  Surgeon: Serafina Mitchell, MD;  Location: [facility];  Service: Cardiovascular;  Laterality: N/A;   MEMBRANE PEEL Left 12/20/2018   Procedure: MEMBRANE PEEL;  Surgeon: Jalene Mullet, MD;  Location: [place];  Service: Ophthalmology;  Laterality: Left;   PATCH ANGIOPLASTY Right 01/18/2015   Procedure: BASILIC VEIN PATCH ANGIOPLASTY USING VASCUGUARD PATCH;  Surgeon: Angelia Mould, MD;  Location: [place];  Service: Vascular;  Laterality: Right;   PERIPHERAL VASCULAR BALLOON ANGIOPLASTY  09/22/2016   Procedure: Peripheral Vascular Balloon Angioplasty;  Surgeon: Serafina Mitchell, MD;  Location: [place] [facility];  Service: Cardiovascular;;  Lt. Fistula   PERIPHERAL VASCULAR BALLOON ANGIOPLASTY Left 03/22/2018   Procedure: PERIPHERAL  VASCULAR BALLOON ANGIOPLASTY;  Surgeon: Serafina Mitchell, MD;  Location: [place] [facility];  Service: Cardiovascular;  Laterality: Left;  Arm shunt   PERIPHERAL VASCULAR CATHETERIZATION N/A 06/23/2016   Procedure: Abdominal Aortogram w/Lower Extremity;  Surgeon: Serafina Mitchell, MD;  Location: [place] [facility];  Service: Cardiovascular;  Laterality: N/A;   PERIPHERAL VASCULAR CATHETERIZATION  06/23/2016   Procedure: Peripheral Vascular Intervention;  Surgeon: Serafina Mitchell, MD;  Location: [place] [facility];  Service: Cardiovascular;;  lt common and external illiac artery   PHOTOCOAGULATION WITH LASER Left 12/20/2018   Procedure: PHOTOCOAGULATION WITH LASER;  Surgeon: Jalene Mullet, MD;  Location: [place];  Service: Ophthalmology;  Laterality: Left;   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   Procedure: REPAIR OF COMPLEX TRACTION RETINAL DETACHMENT;  Surgeon: Jalene Mullet, MD;  Location: [place];  Service: Ophthalmology;  Laterality: Left;   REVISION OF ARTERIOVENOUS GORETEX GRAFT Left  09/05/2020   Procedure: REVISION OF ARTERIOVENOUS GORETEX GRAFT LEFT;  Surgeon: Cherre Robins, MD;  Location: [place];  Service: Vascular;  Laterality: Left;    Short Social History:  Social History   Tobacco Use   Smoking status: Former    Packs/day: 0.35    Years: 40.00    Pack years: 14.00    Types: Cigarettes    Quit date: 05/10/2012    Years since quitting: 8.9   Smokeless tobacco: Never  Substance Use Topics   Alcohol use: No    Allergies  Allergen Reactions   Adhesive  [Tape] Hives   Lisinopril Cough   Prednisone Swelling and Other (See Comments)    Excessive fluid buildup    Current Outpatient Medications  Medication Sig Dispense Refill   albuterol (PROAIR HFA) 108 (90 Base) MCG/ACT inhaler Inhale 2 puffs into the lungs every 4 (four) hours as needed for wheezing or shortness of breath. 18 g 1   amLODipine (NORVASC) 10 MG tablet Take 10 mg by mouth at bedtime.     aspirin EC 81 MG tablet Take 81 mg by mouth daily.     atorvastatin (LIPITOR) 40 MG tablet Take 1 tablet (40 mg total) by mouth daily. 90 tablet 3   busPIRone (BUSPAR) 7.5 MG tablet Take 7.5 mg by mouth 2 (two) times daily.     carvedilol (COREG) 12.5 MG tablet Take 12.5 mg by mouth See admin instructions. Take 1 tablet (12.5 mg) by mouth once daily in the evening on Mondays, Wednesdays, & Fridays. (Dialysis) Take 1 tablet (12.5 mg) by mouth twice daily on Sundays, Tuesdays, Thursdays, & Saturdays. (Non Dialysis)     cinacalcet (SENSIPAR) 60 MG tablet Take 90 mg by mouth See admin instructions. Take 1 tablet (90 mg) by mouth in the evening on Mondays, Wednesdays, & Fridays (dialysis) Take 1 tablet (90 mg) by mouth twice daily on Sundays, Tuesdays, Thursdays, Saturdays. (non dialysis)     docusate sodium (COLACE) 100 MG capsule Take 1 capsule (100 mg total) by mouth 2 (two) times daily. 30 capsule 0   EPINEPHrine (AUVI-Q) 0.3 mg/0.3 mL IJ SOAJ injection Inject 0.3 mg into the muscle as needed for anaphylaxis.  1 each 1   ethyl chloride spray SMARTSIG:1 Spray(s) Topical 3 Times a Week     famotidine (PEPCID) 10 MG tablet Take 1 tablet (10 mg total) by mouth every other day. 15 tablet 0   ferric citrate (AURYXIA) 1 GM 210 MG(Fe) tablet Take 210-420 mg by mouth See admin instructions. Take 2 tablets (420  mg) by mouth with each meal & take 1 tablet (210 mg) by mouth with snacks.     fluticasone (FLONASE) 50 MCG/ACT nasal spray Place 2 sprays into both nostrils daily as needed for allergies or rhinitis. 16 g 5   fluticasone-salmeterol (ADVAIR HFA) 115-21 MCG/ACT inhaler Inhale two puffs twice daily 1 each 5   gabapentin (NEURONTIN) 100 MG capsule TAKE 1 CAPSULE (100 MG TOTAL) BY MOUTH AT BEDTIME. WHEN NECESSARY FOR NEUROPATHY PAIN (Patient taking differently: Take 100 mg by mouth at bedtime as needed (pain). When necessary for neuropathy pain) 30 capsule 3   glipiZIDE (GLUCOTROL) 5 MG tablet Take 5 mg by mouth daily before breakfast.     ipratropium (ATROVENT) 0.06 % nasal spray Place 2 sprays into both nostrils 4 (four) times daily. 15 mL 0   Isosorb Dinitrate-hydrALAZINE (BIDIL PO) Take by mouth.     isosorbide-hydrALAZINE (BIDIL) 20-37.5 MG tablet Take 1 tablet by mouth 3 (three) times daily. BID on days of dialysis 270 tablet 3   lidocaine-prilocaine (EMLA) cream Apply 1 application topically 3 (three) times a week. 1-2 hours prior to dialysis  12   montelukast (SINGULAIR) 10 MG tablet TAKE 1 TABLET BY MOUTH EVERYDAY AT BEDTIME 30 tablet 5   multivitamin (RENA-VIT) TABS tablet Take 1 tablet by mouth daily.   5   omeprazole (PRILOSEC) 20 MG capsule Take 20 mg by mouth daily.      ondansetron (ZOFRAN) 4 MG tablet Take 1 tablet (4 mg total) by mouth every 6 (six) hours as needed for nausea. 20 tablet 0   polyethylene glycol (MIRALAX / GLYCOLAX) 17 g packet Take 17 g by mouth daily. 14 each 0   Spacer/Aero-Holding Chambers (AEROCHAMBER PLUS) inhaler Use with inhaler 1 each 2   traZODone (DESYREL) 50 MG tablet  Take 50 mg by mouth at bedtime as needed for sleep.     Vitamin D, Ergocalciferol, (DRISDOL) 1.25 MG (50000 UT) CAPS capsule Take 50,000 Units by mouth every Sunday. Takes on Sunday     No current facility-administered medications for this visit.    Review of Systems  Constitutional:  Constitutional negative. HENT: HENT negative.  Eyes: Eyes negative.  Respiratory: Respiratory negative.  Cardiovascular: Cardiovascular negative.  GI: Gastrointestinal negative.  Musculoskeletal: Musculoskeletal negative.  Skin: Skin negative.  Neurological: Neurological negative. Hematologic: Hematologic/lymphatic negative.  Psychiatric: Psychiatric negative.       Objective:  Objective   Vitals:   04/02/21 0859  BP: (!) 176/82  Pulse: (!) 57  Resp: 20  Temp: 97.9 F (36.6 C)  SpO2: 98%  Weight: 148 lb (67.1 kg)  Height: 5\' 5"  (1.651 m)   Body mass index is 24.63 kg/m.  Physical Exam HENT:     Head: Normocephalic.     Nose:     Comments: Wearing a mask Eyes:     Pupils: Pupils are equal, round, and reactive to light.  Cardiovascular:     Rate and Rhythm: Normal rate.  Pulmonary:     Effort: Pulmonary effort is normal.  Abdominal:     General: Abdomen is flat.     Palpations: Abdomen is soft.  Musculoskeletal:     Cervical back: Normal range of motion and neck supple.     Comments: Left arm av graft with strong thrill  Skin:    Comments: Lack of pigmentation left upper arm  Neurological:     Mental Status: She is alert.  Psychiatric:        Mood and Affect:  Mood normal.    Data: No studies     Assessment/Plan:     70 year old female with end-stage renal disease now on dialysis via catheter to rest her left upper extremity.  The skin is improving she has no more pain in the left upper arm.  We discussed beginning to recannulate the graft in 2 to 3 weeks when she is ready.  She can follow-up with me on an as-needed basis.     Waynetta Sandy MD [facility]

## 2021-04-04 DIAGNOSIS — N186 End stage renal disease: Secondary | ICD-10-CM | POA: Diagnosis not present

## 2021-04-04 DIAGNOSIS — D631 Anemia in chronic kidney disease: Secondary | ICD-10-CM | POA: Diagnosis not present

## 2021-04-04 DIAGNOSIS — D509 Iron deficiency anemia, unspecified: Secondary | ICD-10-CM | POA: Diagnosis not present

## 2021-04-04 DIAGNOSIS — N2581 Secondary hyperparathyroidism of renal origin: Secondary | ICD-10-CM | POA: Diagnosis not present

## 2021-04-04 DIAGNOSIS — T7840XA Allergy, unspecified, initial encounter: Secondary | ICD-10-CM | POA: Diagnosis not present

## 2021-04-04 DIAGNOSIS — Z992 Dependence on renal dialysis: Secondary | ICD-10-CM | POA: Diagnosis not present

## 2021-04-07 DIAGNOSIS — N2581 Secondary hyperparathyroidism of renal origin: Secondary | ICD-10-CM | POA: Diagnosis not present

## 2021-04-07 DIAGNOSIS — T7840XA Allergy, unspecified, initial encounter: Secondary | ICD-10-CM | POA: Diagnosis not present

## 2021-04-07 DIAGNOSIS — D631 Anemia in chronic kidney disease: Secondary | ICD-10-CM | POA: Diagnosis not present

## 2021-04-07 DIAGNOSIS — D509 Iron deficiency anemia, unspecified: Secondary | ICD-10-CM | POA: Diagnosis not present

## 2021-04-07 DIAGNOSIS — N186 End stage renal disease: Secondary | ICD-10-CM | POA: Diagnosis not present

## 2021-04-07 DIAGNOSIS — Z992 Dependence on renal dialysis: Secondary | ICD-10-CM | POA: Diagnosis not present

## 2021-04-09 ENCOUNTER — Other Ambulatory Visit: Payer: Self-pay

## 2021-04-09 ENCOUNTER — Ambulatory Visit (INDEPENDENT_AMBULATORY_CARE_PROVIDER_SITE_OTHER): Payer: Medicare Other

## 2021-04-09 ENCOUNTER — Encounter (INDEPENDENT_AMBULATORY_CARE_PROVIDER_SITE_OTHER): Payer: Self-pay

## 2021-04-09 DIAGNOSIS — N186 End stage renal disease: Secondary | ICD-10-CM | POA: Diagnosis not present

## 2021-04-09 DIAGNOSIS — D509 Iron deficiency anemia, unspecified: Secondary | ICD-10-CM | POA: Diagnosis not present

## 2021-04-09 DIAGNOSIS — D631 Anemia in chronic kidney disease: Secondary | ICD-10-CM | POA: Diagnosis not present

## 2021-04-09 DIAGNOSIS — J309 Allergic rhinitis, unspecified: Secondary | ICD-10-CM | POA: Diagnosis not present

## 2021-04-09 DIAGNOSIS — IMO0002 Reserved for concepts with insufficient information to code with codable children: Secondary | ICD-10-CM

## 2021-04-09 DIAGNOSIS — T7840XA Allergy, unspecified, initial encounter: Secondary | ICD-10-CM | POA: Diagnosis not present

## 2021-04-09 DIAGNOSIS — Z992 Dependence on renal dialysis: Secondary | ICD-10-CM | POA: Diagnosis not present

## 2021-04-09 DIAGNOSIS — E1165 Type 2 diabetes mellitus with hyperglycemia: Secondary | ICD-10-CM

## 2021-04-09 DIAGNOSIS — N2581 Secondary hyperparathyroidism of renal origin: Secondary | ICD-10-CM | POA: Diagnosis not present

## 2021-04-09 MED ORDER — OFLOXACIN 0.3 % OP SOLN: 1.0000 [drp] | OPHTHALMIC | 0 refills | Status: AC

## 2021-04-09 MED ORDER — PREDNISOLONE ACETATE 1 % OP SUSP
1.0000 [drp] | Freq: Four times a day (QID) | OPHTHALMIC | 0 refills | Status: AC
Start: 2021-04-09 — End: 2021-04-15

## 2021-04-09 NOTE — Progress Notes (Signed)
04/09/2021     CHIEF COMPLAINT Patient presents for Pre-op Exam   HISTORY OF PRESENT ILLNESS: ELLANOR FEUERSTEIN is a 70 y.o. female who presents to the clinic today for:   HPI   Pre-op visit for scheduled Vitrectomy with Membrane Peel in the right eye. Surgery Date: 04/23/21 Last edited by Reather Littler, COA on 04/09/2021  2:27 PM.        HISTORICAL INFORMATION:   Selected notes from the MEDICAL RECORD NUMBER    Lab Results  Component Value Date   HGBA1C 5.6 06/26/2019     CURRENT MEDICATIONS: No current outpatient medications on file. (Ophthalmic Drugs)   No current facility-administered medications for this visit. (Ophthalmic Drugs)   Current Outpatient Medications (Other)  Medication Sig   albuterol (PROAIR HFA) 108 (90 Base) MCG/ACT inhaler Inhale 2 puffs into the lungs every 4 (four) hours as needed for wheezing or shortness of breath.   amLODipine (NORVASC) 10 MG tablet Take 10 mg by mouth at bedtime.   aspirin EC 81 MG tablet Take 81 mg by mouth daily.   atorvastatin (LIPITOR) 40 MG tablet Take 1 tablet (40 mg total) by mouth daily.   busPIRone (BUSPAR) 7.5 MG tablet Take 7.5 mg by mouth 2 (two) times daily.   carvedilol (COREG) 12.5 MG tablet Take 12.5 mg by mouth See admin instructions. Take 1 tablet (12.5 mg) by mouth once daily in the evening on Mondays, Wednesdays, & Fridays. (Dialysis) Take 1 tablet (12.5 mg) by mouth twice daily on Sundays, Tuesdays, Thursdays, & Saturdays. (Non Dialysis)   cinacalcet (SENSIPAR) 60 MG tablet Take 90 mg by mouth See admin instructions. Take 1 tablet (90 mg) by mouth in the evening on Mondays, Wednesdays, & Fridays (dialysis) Take 1 tablet (90 mg) by mouth twice daily on Sundays, Tuesdays, Thursdays, Saturdays. (non dialysis)   docusate sodium (COLACE) 100 MG capsule Take 1 capsule (100 mg total) by mouth 2 (two) times daily.   EPINEPHrine (AUVI-Q) 0.3 mg/0.3 mL IJ SOAJ injection Inject 0.3 mg into the muscle as needed for  anaphylaxis.   ethyl chloride spray SMARTSIG:1 Spray(s) Topical 3 Times a Week   famotidine (PEPCID) 10 MG tablet Take 1 tablet (10 mg total) by mouth every other day.   ferric citrate (AURYXIA) 1 GM 210 MG(Fe) tablet Take 210-420 mg by mouth See admin instructions. Take 2 tablets (420 mg) by mouth with each meal & take 1 tablet (210 mg) by mouth with snacks.   fluticasone (FLONASE) 50 MCG/ACT nasal spray Place 2 sprays into both nostrils daily as needed for allergies or rhinitis.   fluticasone-salmeterol (ADVAIR HFA) 115-21 MCG/ACT inhaler Inhale two puffs twice daily   gabapentin (NEURONTIN) 100 MG capsule TAKE 1 CAPSULE (100 MG TOTAL) BY MOUTH AT BEDTIME. WHEN NECESSARY FOR NEUROPATHY PAIN (Patient taking differently: Take 100 mg by mouth at bedtime as needed (pain). When necessary for neuropathy pain)   glipiZIDE (GLUCOTROL) 5 MG tablet Take 5 mg by mouth daily before breakfast.   ipratropium (ATROVENT) 0.06 % nasal spray Place 2 sprays into both nostrils 4 (four) times daily.   Isosorb Dinitrate-hydrALAZINE (BIDIL PO) Take by mouth.   isosorbide-hydrALAZINE (BIDIL) 20-37.5 MG tablet Take 1 tablet by mouth 3 (three) times daily. BID on days of dialysis   lidocaine-prilocaine (EMLA) cream Apply 1 application topically 3 (three) times a week. 1-2 hours prior to dialysis   montelukast (SINGULAIR) 10 MG tablet TAKE 1 TABLET BY MOUTH EVERYDAY AT BEDTIME   multivitamin (  RENA-VIT) TABS tablet Take 1 tablet by mouth daily.    omeprazole (PRILOSEC) 20 MG capsule Take 20 mg by mouth daily.    ondansetron (ZOFRAN) 4 MG tablet Take 1 tablet (4 mg total) by mouth every 6 (six) hours as needed for nausea.   polyethylene glycol (MIRALAX / GLYCOLAX) 17 g packet Take 17 g by mouth daily.   Spacer/Aero-Holding Chambers (AEROCHAMBER PLUS) inhaler Use with inhaler   traZODone (DESYREL) 50 MG tablet Take 50 mg by mouth at bedtime as needed for sleep.   Vitamin D, Ergocalciferol, (DRISDOL) 1.25 MG (50000 UT) CAPS  capsule Take 50,000 Units by mouth every Sunday. Takes on Sunday   No current facility-administered medications for this visit. (Other)     ALLERGIES Allergies  Allergen Reactions   Adhesive  [Tape] Hives   Lisinopril Cough   Prednisone Swelling and Other (See Comments)    Excessive fluid buildup    PAST MEDICAL HISTORY Past Medical History:  Diagnosis Date   Abdominal bruit    Anxiety    Arthritis    Osteoarthritis   Asthma    Cervical disc disease    "pinced nerve"   CHF (congestive heart failure) (HCC)    Diabetes mellitus    Type II   Diverticulitis    ESRD (end stage renal disease) ([place])    dialysis - M/W/F- [place]   GERD (gastroesophageal reflux disease)    from medications   GI bleed 03/31/2013   Head injury 07/2017   History of hiatal hernia    Hyperlipidemia    Hypertension    Neuropathy    left leg   Osteoporosis    Peripheral vascular disease ([place])    Pneumonia    "very young" and a few years ago   Seasonal allergies    Shortness of breath dyspnea    WIth exertion, when fluid builds   Sleep apnea    can't afford cpap    Past Surgical History:  Procedure Laterality Date   A/V SHUNTOGRAM N/A 09/22/2016   Procedure: A/V Shuntogram - left arm;  Surgeon: Serafina Mitchell, MD;  Location: [place] [facility];  Service: Cardiovascular;  Laterality: N/A;   A/V SHUNTOGRAM N/A 03/22/2018   Procedure: A/V SHUNTOGRAM - left arm;  Surgeon: Serafina Mitchell, MD;  Location: [place] [facility];  Service: Cardiovascular;  Laterality: N/A;   A/V SHUNTOGRAM Left 11/17/2018   Procedure: A/V SHUNTOGRAM;  Surgeon: Angelia Mould, MD;  Location: [facility];  Service: Cardiovascular;  Laterality: Left;   ABDOMINAL HYSTERECTOMY  1993`   AMPUTATION Left 09/01/2016   Procedure: LEFT FOOT TRANSMETATARSAL AMPUTATION;  Surgeon: Newt Minion, MD;  Location: [place];  Service: Orthopedics;  Laterality: Left;   AMPUTATION Right 08/11/2017   Procedure: RIGHT GREAT TOE  AMPUTATION DIGIT;  Surgeon: Rosetta Posner, MD;  Location: [place];  Service: Vascular;  Laterality: Right;   AMPUTATION Right 12/31/2017   Procedure: RIGHT TRANSMETATARSAL AMPUTATION;  Surgeon: Newt Minion, MD;  Location: [place];  Service: Orthopedics;  Laterality: Right;   AV FISTULA PLACEMENT Left 04/21/2016   Procedure: INSERTION OF ARTERIOVENOUS (AV) GORE-TEX GRAFT ARM LEFT;  Surgeon: Elam Dutch, MD;  Location: [place];  Service: Vascular;  Laterality: Left;   BASCILIC VEIN TRANSPOSITION Left 07/10/2014   Procedure: BASCILIC VEIN TRANSPOSITION;  Surgeon: Angelia Mould, MD;  Location: [place];  Service: Vascular;  Laterality: Left;   [place] Right 11/08/2014   Procedure: FIRST  STAGE BASILIC VEIN TRANSPOSITION;  Surgeon: Angelia Mould, MD;  Location: [place];  Service: Vascular;  Laterality: Right;   [place] Right 01/18/2015   Procedure: SECOND STAGE BASILIC VEIN TRANSPOSITION;  Surgeon: Angelia Mould, MD;  Location: [place];  Service: Vascular;  Laterality: Right;   CRANIOTOMY N/A 08/23/2017   Procedure: CRANIOTOMY HEMATOMA EVACUATION SUBDURAL;  Surgeon: Ashok Pall, MD;  Location: [place];  Service: Neurosurgery;  Laterality: N/A;   CRANIOTOMY Left 08/24/2017   Procedure: CRANIOTOMY FOR RECURRENT ACUTE SUBDURAL HEMATOMA;  Surgeon: Ashok Pall, MD;  Location: [place];  Service: Neurosurgery;  Laterality: Left;   ESOPHAGOGASTRODUODENOSCOPY N/A 03/31/2013   Procedure: ESOPHAGOGASTRODUODENOSCOPY (EGD);  Surgeon: Gatha Mayer, MD;  Location: [facility];  Service: Endoscopy;  Laterality: N/A;   EYE SURGERY     laser surgery   FEMORAL-POPLITEAL BYPASS GRAFT Left 07/08/2016   Procedure: LEFT  FEMORAL-BELOW KNEE POPLITEAL ARTERY BYPASS GRAFT USING 6MM X 80 CM PROPATEN GORETEX GRAFT WITH RINGS.;  Surgeon: Rosetta Posner, MD;  Location: [facility] OR;  Service: Vascular;  Laterality: Left;   FEMORAL-POPLITEAL BYPASS GRAFT Right 08/11/2017   Procedure:  RIGHT FEMORAL TO BELOW KNEE POPLITEAL ARTERKY  BYPASS GRAFT USING 6MM RINGED PROPATEN GRAFT;  Surgeon: Rosetta Posner, MD;  Location: [place];  Service: Vascular;  Laterality: Right;   FISTULOGRAM Left 10/29/2014   Procedure: FISTULOGRAM;  Surgeon: Angelia Mould, MD;  Location: [facility];  Service: Cardiovascular;  Laterality: Left;   GAS/FLUID EXCHANGE Left 12/20/2018   Procedure: AIR GAS EXCHANGE;  Surgeon: Jalene Mullet, MD;  Location: [place];  Service: Ophthalmology;  Laterality: Left;   INSERTION OF DIALYSIS CATHETER N/A 09/05/2020   Procedure: INSERTION OF DIALYSIS CATHETER;  Surgeon: Cherre Robins, MD;  Location: [place];  Service: Vascular;  Laterality: N/A;   INSERTION OF DIALYSIS CATHETER Left 03/13/2021   Procedure: INSERTION OF DIALYSIS CATHETER;  Surgeon: Waynetta Sandy, MD;  Location: [place];  Service: Vascular;  Laterality: Left;   KNEE ARTHROSCOPY Right 05/06/2018   Procedure: RIGHT KNEE ARTHROSCOPY;  Surgeon: Newt Minion, MD;  Location: [place];  Service: Orthopedics;  Laterality: Right;   KNEE ARTHROSCOPY Right 06/10/2018   Procedure: RIGHT KNEE ARTHROSCOPY AND DEBRIDEMENT;  Surgeon: Newt Minion, MD;  Location: [place];  Service: Orthopedics;  Laterality: Right;   LIGATION OF ARTERIOVENOUS  FISTULA Left 04/21/2016   Procedure: LIGATION OF ARTERIOVENOUS  FISTULA LEFT ARM;  Surgeon: Elam Dutch, MD;  Location: [place];  Service: Vascular;  Laterality: Left;   LOWER EXTREMITY ANGIOGRAPHY N/A 04/06/2017   Procedure: Lower Extremity Angiography - Right;  Surgeon: Serafina Mitchell, MD;  Location: [place] [facility];  Service: Cardiovascular;  Laterality: N/A;   MEMBRANE PEEL Left 12/20/2018   Procedure: MEMBRANE PEEL;  Surgeon: Jalene Mullet, MD;  Location: [place];  Service: Ophthalmology;  Laterality: Left;   PATCH ANGIOPLASTY Right 01/18/2015   Procedure: BASILIC VEIN PATCH ANGIOPLASTY USING VASCUGUARD PATCH;  Surgeon: Angelia Mould, MD;  Location: [place];   Service: Vascular;  Laterality: Right;   PERIPHERAL VASCULAR BALLOON ANGIOPLASTY  09/22/2016   Procedure: Peripheral Vascular Balloon Angioplasty;  Surgeon: Serafina Mitchell, MD;  Location: [place] [facility];  Service: Cardiovascular;;  Lt. Fistula   PERIPHERAL VASCULAR BALLOON ANGIOPLASTY Left 03/22/2018   Procedure: PERIPHERAL VASCULAR BALLOON ANGIOPLASTY;  Surgeon: Serafina Mitchell, MD;  Location: [place] [facility];  Service: Cardiovascular;  Laterality: Left;  Arm shunt  PERIPHERAL VASCULAR CATHETERIZATION N/A 06/23/2016   Procedure: Abdominal Aortogram w/Lower Extremity;  Surgeon: Serafina Mitchell, MD;  Location: [place] [facility];  Service: Cardiovascular;  Laterality: N/A;   PERIPHERAL VASCULAR CATHETERIZATION  06/23/2016   Procedure: Peripheral Vascular Intervention;  Surgeon: Serafina Mitchell, MD;  Location: [place] [facility];  Service: Cardiovascular;;  lt common and external illiac artery   PHOTOCOAGULATION WITH LASER Left 12/20/2018   Procedure: PHOTOCOAGULATION WITH LASER;  Surgeon: Jalene Mullet, MD;  Location: [place];  Service: Ophthalmology;  Laterality: Left;   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   Procedure: REPAIR OF COMPLEX TRACTION RETINAL DETACHMENT;  Surgeon: Jalene Mullet, MD;  Location: [place];  Service: Ophthalmology;  Laterality: Left;   REVISION OF ARTERIOVENOUS GORETEX GRAFT Left 09/05/2020   Procedure: REVISION OF ARTERIOVENOUS GORETEX GRAFT LEFT;  Surgeon: Cherre Robins, MD;  [facility];  Service: Vascular;  Laterality: Left;    FAMILY HISTORY Family History  Problem Relation Age of Onset   Other Mother        not sure of cause of death   Diabetes Father    Diabetes Sister    Diabetes Sister    Pancreatic cancer Maternal Grandmother    Colon cancer Neg Hx     SOCIAL HISTORY Social History   Tobacco Use   Smoking status: Former    Packs/day: 0.35    Years: 40.00    Pack years: 14.00    Types: Cigarettes    Quit date:  05/10/2012    Years since quitting: 8.9   Smokeless tobacco: Never  Vaping Use   Vaping Use: Never used  Substance Use Topics   Alcohol use: No   Drug use: No    Comment: marijuana; quit in early 1980's         OPHTHALMIC EXAM:  Base Eye Exam     Visual Acuity (ETDRS)       Right Left   Dist [place] 20/100 +2 20/100 +1   Dist ph [place] 20/80 -2 NI         Tonometry (Tonopen, 2:34 PM)       Right Left   Pressure 17          Neuro/Psych     Oriented x3: Yes   Mood/Affect: Normal         Dilation     NO DIATION            Slit Lamp and Fundus Exam     External Exam       Right Left   External Normal Normal            IMAGING AND PROCEDURES  Imaging and Procedures for @TODAY @           ASSESSMENT/PLAN:  No diagnosis found.  Ophthalmic Meds Ordered this visit:  No orders of the defined types were placed in this encounter.       Pre-op completed. Operative consent obtained with pre-op eye drops reviewed with [name] and sent via [facility] as needed. Post op instructions reviewed with patient and per patient all questions answered.  [employer], COA

## 2021-04-11 DIAGNOSIS — D631 Anemia in chronic kidney disease: Secondary | ICD-10-CM | POA: Diagnosis not present

## 2021-04-11 DIAGNOSIS — Z992 Dependence on renal dialysis: Secondary | ICD-10-CM | POA: Diagnosis not present

## 2021-04-11 DIAGNOSIS — N2581 Secondary hyperparathyroidism of renal origin: Secondary | ICD-10-CM | POA: Diagnosis not present

## 2021-04-11 DIAGNOSIS — D509 Iron deficiency anemia, unspecified: Secondary | ICD-10-CM | POA: Diagnosis not present

## 2021-04-11 DIAGNOSIS — N186 End stage renal disease: Secondary | ICD-10-CM | POA: Diagnosis not present

## 2021-04-11 DIAGNOSIS — T7840XA Allergy, unspecified, initial encounter: Secondary | ICD-10-CM | POA: Diagnosis not present

## 2021-04-14 DIAGNOSIS — D631 Anemia in chronic kidney disease: Secondary | ICD-10-CM | POA: Diagnosis not present

## 2021-04-14 DIAGNOSIS — N2581 Secondary hyperparathyroidism of renal origin: Secondary | ICD-10-CM | POA: Diagnosis not present

## 2021-04-14 DIAGNOSIS — T7840XA Allergy, unspecified, initial encounter: Secondary | ICD-10-CM | POA: Diagnosis not present

## 2021-04-14 DIAGNOSIS — D509 Iron deficiency anemia, unspecified: Secondary | ICD-10-CM | POA: Diagnosis not present

## 2021-04-14 DIAGNOSIS — Z992 Dependence on renal dialysis: Secondary | ICD-10-CM | POA: Diagnosis not present

## 2021-04-14 DIAGNOSIS — N186 End stage renal disease: Secondary | ICD-10-CM | POA: Diagnosis not present

## 2021-04-16 DIAGNOSIS — N186 End stage renal disease: Secondary | ICD-10-CM | POA: Diagnosis not present

## 2021-04-16 DIAGNOSIS — Z992 Dependence on renal dialysis: Secondary | ICD-10-CM | POA: Diagnosis not present

## 2021-04-16 DIAGNOSIS — N2581 Secondary hyperparathyroidism of renal origin: Secondary | ICD-10-CM | POA: Diagnosis not present

## 2021-04-16 DIAGNOSIS — D631 Anemia in chronic kidney disease: Secondary | ICD-10-CM | POA: Diagnosis not present

## 2021-04-16 DIAGNOSIS — D509 Iron deficiency anemia, unspecified: Secondary | ICD-10-CM | POA: Diagnosis not present

## 2021-04-16 DIAGNOSIS — T7840XA Allergy, unspecified, initial encounter: Secondary | ICD-10-CM | POA: Diagnosis not present

## 2021-04-17 ENCOUNTER — Other Ambulatory Visit: Payer: Self-pay | Admitting: *Deleted

## 2021-04-17 NOTE — Patient Outreach (Addendum)
[facility]) Care Management  04/17/2021  TAKELIA URIETA [birth year] [id number]  Successful telephone outreach call to patient. HIPAA identifiers obtained. Nurse called patient to inform her that this nurse would not be able to continue reach out to patient due to her PCP not being a [facility] provider. Patient thanked this nurse and [facility] for their care and services and did verbalize understanding.  Plan: Nurse Health Coach will send PCP and patient a closed case letter and will close patient's case.  Emelia Loron RN, [place] [phone] [email or web address]

## 2021-04-18 DIAGNOSIS — D509 Iron deficiency anemia, unspecified: Secondary | ICD-10-CM | POA: Diagnosis not present

## 2021-04-18 DIAGNOSIS — D631 Anemia in chronic kidney disease: Secondary | ICD-10-CM | POA: Diagnosis not present

## 2021-04-18 DIAGNOSIS — N186 End stage renal disease: Secondary | ICD-10-CM | POA: Diagnosis not present

## 2021-04-18 DIAGNOSIS — Z992 Dependence on renal dialysis: Secondary | ICD-10-CM | POA: Diagnosis not present

## 2021-04-18 DIAGNOSIS — N2581 Secondary hyperparathyroidism of renal origin: Secondary | ICD-10-CM | POA: Diagnosis not present

## 2021-04-18 DIAGNOSIS — T7840XA Allergy, unspecified, initial encounter: Secondary | ICD-10-CM | POA: Diagnosis not present

## 2021-04-21 DIAGNOSIS — Z992 Dependence on renal dialysis: Secondary | ICD-10-CM | POA: Diagnosis not present

## 2021-04-21 DIAGNOSIS — N2581 Secondary hyperparathyroidism of renal origin: Secondary | ICD-10-CM | POA: Diagnosis not present

## 2021-04-21 DIAGNOSIS — N186 End stage renal disease: Secondary | ICD-10-CM | POA: Diagnosis not present

## 2021-04-21 DIAGNOSIS — T7840XA Allergy, unspecified, initial encounter: Secondary | ICD-10-CM | POA: Diagnosis not present

## 2021-04-21 DIAGNOSIS — D631 Anemia in chronic kidney disease: Secondary | ICD-10-CM | POA: Diagnosis not present

## 2021-04-21 DIAGNOSIS — D509 Iron deficiency anemia, unspecified: Secondary | ICD-10-CM | POA: Diagnosis not present

## 2021-04-22 ENCOUNTER — Ambulatory Visit (INDEPENDENT_AMBULATORY_CARE_PROVIDER_SITE_OTHER): Payer: Medicare Other | Admitting: *Deleted

## 2021-04-22 DIAGNOSIS — J309 Allergic rhinitis, unspecified: Secondary | ICD-10-CM

## 2021-04-24 ENCOUNTER — Encounter (INDEPENDENT_AMBULATORY_CARE_PROVIDER_SITE_OTHER): Payer: Medicare Other | Admitting: Ophthalmology

## 2021-04-25 DIAGNOSIS — D509 Iron deficiency anemia, unspecified: Secondary | ICD-10-CM | POA: Diagnosis not present

## 2021-04-25 DIAGNOSIS — N186 End stage renal disease: Secondary | ICD-10-CM | POA: Diagnosis not present

## 2021-04-25 DIAGNOSIS — Z992 Dependence on renal dialysis: Secondary | ICD-10-CM | POA: Diagnosis not present

## 2021-04-25 DIAGNOSIS — D631 Anemia in chronic kidney disease: Secondary | ICD-10-CM | POA: Diagnosis not present

## 2021-04-25 DIAGNOSIS — N2581 Secondary hyperparathyroidism of renal origin: Secondary | ICD-10-CM | POA: Diagnosis not present

## 2021-04-25 DIAGNOSIS — T7840XA Allergy, unspecified, initial encounter: Secondary | ICD-10-CM | POA: Diagnosis not present

## 2021-04-26 DIAGNOSIS — D631 Anemia in chronic kidney disease: Secondary | ICD-10-CM | POA: Diagnosis not present

## 2021-04-26 DIAGNOSIS — Z23 Encounter for immunization: Secondary | ICD-10-CM | POA: Diagnosis not present

## 2021-04-26 DIAGNOSIS — D509 Iron deficiency anemia, unspecified: Secondary | ICD-10-CM | POA: Diagnosis not present

## 2021-04-26 DIAGNOSIS — N186 End stage renal disease: Secondary | ICD-10-CM | POA: Diagnosis not present

## 2021-04-26 DIAGNOSIS — E876 Hypokalemia: Secondary | ICD-10-CM | POA: Diagnosis not present

## 2021-04-26 DIAGNOSIS — Z992 Dependence on renal dialysis: Secondary | ICD-10-CM | POA: Diagnosis not present

## 2021-04-26 DIAGNOSIS — I159 Secondary hypertension, unspecified: Secondary | ICD-10-CM | POA: Diagnosis not present

## 2021-04-26 DIAGNOSIS — N2581 Secondary hyperparathyroidism of renal origin: Secondary | ICD-10-CM | POA: Diagnosis not present

## 2021-04-28 DIAGNOSIS — N186 End stage renal disease: Secondary | ICD-10-CM | POA: Diagnosis not present

## 2021-04-28 DIAGNOSIS — D509 Iron deficiency anemia, unspecified: Secondary | ICD-10-CM | POA: Diagnosis not present

## 2021-04-28 DIAGNOSIS — E876 Hypokalemia: Secondary | ICD-10-CM | POA: Diagnosis not present

## 2021-04-28 DIAGNOSIS — D631 Anemia in chronic kidney disease: Secondary | ICD-10-CM | POA: Diagnosis not present

## 2021-04-28 DIAGNOSIS — N2581 Secondary hyperparathyroidism of renal origin: Secondary | ICD-10-CM | POA: Diagnosis not present

## 2021-04-28 DIAGNOSIS — Z992 Dependence on renal dialysis: Secondary | ICD-10-CM | POA: Diagnosis not present

## 2021-04-30 DIAGNOSIS — N186 End stage renal disease: Secondary | ICD-10-CM | POA: Diagnosis not present

## 2021-04-30 DIAGNOSIS — D509 Iron deficiency anemia, unspecified: Secondary | ICD-10-CM | POA: Diagnosis not present

## 2021-04-30 DIAGNOSIS — N2581 Secondary hyperparathyroidism of renal origin: Secondary | ICD-10-CM | POA: Diagnosis not present

## 2021-04-30 DIAGNOSIS — D631 Anemia in chronic kidney disease: Secondary | ICD-10-CM | POA: Diagnosis not present

## 2021-04-30 DIAGNOSIS — Z992 Dependence on renal dialysis: Secondary | ICD-10-CM | POA: Diagnosis not present

## 2021-04-30 DIAGNOSIS — E876 Hypokalemia: Secondary | ICD-10-CM | POA: Diagnosis not present

## 2021-04-30 DIAGNOSIS — E1122 Type 2 diabetes mellitus with diabetic chronic kidney disease: Secondary | ICD-10-CM | POA: Diagnosis not present

## 2021-05-01 ENCOUNTER — Ambulatory Visit (INDEPENDENT_AMBULATORY_CARE_PROVIDER_SITE_OTHER): Payer: Medicare Other | Admitting: *Deleted

## 2021-05-01 DIAGNOSIS — J309 Allergic rhinitis, unspecified: Secondary | ICD-10-CM | POA: Diagnosis not present

## 2021-05-02 DIAGNOSIS — D631 Anemia in chronic kidney disease: Secondary | ICD-10-CM | POA: Diagnosis not present

## 2021-05-02 DIAGNOSIS — N2581 Secondary hyperparathyroidism of renal origin: Secondary | ICD-10-CM | POA: Diagnosis not present

## 2021-05-02 DIAGNOSIS — N186 End stage renal disease: Secondary | ICD-10-CM | POA: Diagnosis not present

## 2021-05-02 DIAGNOSIS — E876 Hypokalemia: Secondary | ICD-10-CM | POA: Diagnosis not present

## 2021-05-02 DIAGNOSIS — D509 Iron deficiency anemia, unspecified: Secondary | ICD-10-CM | POA: Diagnosis not present

## 2021-05-02 DIAGNOSIS — Z992 Dependence on renal dialysis: Secondary | ICD-10-CM | POA: Diagnosis not present

## 2021-05-05 DIAGNOSIS — N2581 Secondary hyperparathyroidism of renal origin: Secondary | ICD-10-CM | POA: Diagnosis not present

## 2021-05-05 DIAGNOSIS — D509 Iron deficiency anemia, unspecified: Secondary | ICD-10-CM | POA: Diagnosis not present

## 2021-05-05 DIAGNOSIS — Z992 Dependence on renal dialysis: Secondary | ICD-10-CM | POA: Diagnosis not present

## 2021-05-05 DIAGNOSIS — D631 Anemia in chronic kidney disease: Secondary | ICD-10-CM | POA: Diagnosis not present

## 2021-05-05 DIAGNOSIS — E876 Hypokalemia: Secondary | ICD-10-CM | POA: Diagnosis not present

## 2021-05-05 DIAGNOSIS — N186 End stage renal disease: Secondary | ICD-10-CM | POA: Diagnosis not present

## 2021-05-07 ENCOUNTER — Encounter (AMBULATORY_SURGERY_CENTER): Payer: Medicare Other | Admitting: Ophthalmology

## 2021-05-07 DIAGNOSIS — E113521 Type 2 diabetes mellitus with proliferative diabetic retinopathy with traction retinal detachment involving the macula, right eye: Secondary | ICD-10-CM

## 2021-05-08 ENCOUNTER — Ambulatory Visit (INDEPENDENT_AMBULATORY_CARE_PROVIDER_SITE_OTHER): Payer: Medicare Other | Admitting: Ophthalmology

## 2021-05-08 ENCOUNTER — Other Ambulatory Visit: Payer: Self-pay

## 2021-05-08 ENCOUNTER — Encounter (INDEPENDENT_AMBULATORY_CARE_PROVIDER_SITE_OTHER): Payer: Self-pay | Admitting: Ophthalmology

## 2021-05-08 DIAGNOSIS — H43821 Vitreomacular adhesion, right eye: Secondary | ICD-10-CM

## 2021-05-08 NOTE — Assessment & Plan Note (Signed)
OD looks great, postop day #1 vitrectomy and release of VMT component of traction retinal detachment

## 2021-05-08 NOTE — Progress Notes (Signed)
05/08/2021     CHIEF COMPLAINT Patient presents for  Chief Complaint  Patient presents with   Routine Post Op      HISTORY OF PRESENT ILLNESS: Destiny Day is a 70 y.o. female who presents to the clinic today for:   HPI   POD#1 OD s/p PPV/MP with endolaser (05/07/21), for ERM  Pt c/o eye pain on the right side as the anesthetic began to wear off, Tylenol extra strength resolved this pain.  Last edited by Hurman Horn, MD on 05/08/2021  9:18 AM.      Referring physician: Debbra Riding, MD [address] [place],  [place] [address]  HISTORICAL INFORMATION:   Selected notes from the MEDICAL RECORD NUMBER    Lab Results  Component Value Date   HGBA1C 5.6 06/26/2019     CURRENT MEDICATIONS: No current outpatient medications on file. (Ophthalmic Drugs)   No current facility-administered medications for this visit. (Ophthalmic Drugs)   Current Outpatient Medications (Other)  Medication Sig   albuterol (PROAIR HFA) 108 (90 Base) MCG/ACT inhaler Inhale 2 puffs into the lungs every 4 (four) hours as needed for wheezing or shortness of breath.   amLODipine (NORVASC) 10 MG tablet Take 10 mg by mouth at bedtime.   aspirin EC 81 MG tablet Take 81 mg by mouth daily.   atorvastatin (LIPITOR) 40 MG tablet Take 1 tablet (40 mg total) by mouth daily.   busPIRone (BUSPAR) 7.5 MG tablet Take 7.5 mg by mouth 2 (two) times daily.   carvedilol (COREG) 12.5 MG tablet Take 12.5 mg by mouth See admin instructions. Take 1 tablet (12.5 mg) by mouth once daily in the evening on Mondays, Wednesdays, & Fridays. (Dialysis) Take 1 tablet (12.5 mg) by mouth twice daily on Sundays, Tuesdays, Thursdays, & Saturdays. (Non Dialysis)   cinacalcet (SENSIPAR) 60 MG tablet Take 90 mg by mouth See admin instructions. Take 1 tablet (90 mg) by mouth in the evening on Mondays, Wednesdays, & Fridays (dialysis) Take 1 tablet (90 mg) by mouth twice daily on Sundays, Tuesdays, Thursdays,  Saturdays. (non dialysis)   docusate sodium (COLACE) 100 MG capsule Take 1 capsule (100 mg total) by mouth 2 (two) times daily.   EPINEPHrine (AUVI-Q) 0.3 mg/0.3 mL IJ SOAJ injection Inject 0.3 mg into the muscle as needed for anaphylaxis.   ethyl chloride spray SMARTSIG:1 Spray(s) Topical 3 Times a Week   famotidine (PEPCID) 10 MG tablet Take 1 tablet (10 mg total) by mouth every other day.   ferric citrate (AURYXIA) 1 GM 210 MG(Fe) tablet Take 210-420 mg by mouth See admin instructions. Take 2 tablets (420 mg) by mouth with each meal & take 1 tablet (210 mg) by mouth with snacks.   fluticasone (FLONASE) 50 MCG/ACT nasal spray Place 2 sprays into both nostrils daily as needed for allergies or rhinitis.   fluticasone-salmeterol (ADVAIR HFA) 115-21 MCG/ACT inhaler Inhale two puffs twice daily   gabapentin (NEURONTIN) 100 MG capsule TAKE 1 CAPSULE (100 MG TOTAL) BY MOUTH AT BEDTIME. WHEN NECESSARY FOR NEUROPATHY PAIN (Patient taking differently: Take 100 mg by mouth at bedtime as needed (pain). When necessary for neuropathy pain)   glipiZIDE (GLUCOTROL) 5 MG tablet Take 5 mg by mouth daily before breakfast.   ipratropium (ATROVENT) 0.06 % nasal spray Place 2 sprays into both nostrils 4 (four) times daily.   Isosorb Dinitrate-hydrALAZINE (BIDIL PO) Take by mouth.   isosorbide-hydrALAZINE (BIDIL) 20-37.5 MG tablet Take 1 tablet by mouth 3 (  three) times daily. BID on days of dialysis   lidocaine-prilocaine (EMLA) cream Apply 1 application topically 3 (three) times a week. 1-2 hours prior to dialysis   montelukast (SINGULAIR) 10 MG tablet TAKE 1 TABLET BY MOUTH EVERYDAY AT BEDTIME   multivitamin (RENA-VIT) TABS tablet Take 1 tablet by mouth daily.    omeprazole (PRILOSEC) 20 MG capsule Take 20 mg by mouth daily.    ondansetron (ZOFRAN) 4 MG tablet Take 1 tablet (4 mg total) by mouth every 6 (six) hours as needed for nausea.   polyethylene glycol (MIRALAX / GLYCOLAX) 17 g packet Take 17 g by mouth daily.    Spacer/Aero-Holding Chambers (AEROCHAMBER PLUS) inhaler Use with inhaler   traZODone (DESYREL) 50 MG tablet Take 50 mg by mouth at bedtime as needed for sleep.   Vitamin D, Ergocalciferol, (DRISDOL) 1.25 MG (50000 UT) CAPS capsule Take 50,000 Units by mouth every Sunday. Takes on Sunday   No current facility-administered medications for this visit. (Other)      REVIEW OF SYSTEMS:    ALLERGIES Allergies  Allergen Reactions   Adhesive  [Tape] Hives   Lisinopril Cough   Prednisone Swelling and Other (See Comments)    Excessive fluid buildup    PAST MEDICAL HISTORY Past Medical History:  Diagnosis Date   Abdominal bruit    Anxiety    Arthritis    Osteoarthritis   Asthma    Cervical disc disease    "pinced nerve"   CHF (congestive heart failure) (HCC)    Diabetes mellitus    Type II   Diverticulitis    ESRD (end stage renal disease) ([place])    dialysis - M/W/F- [place]   GERD (gastroesophageal reflux disease)    from medications   GI bleed 03/31/2013   Head injury 07/2017   History of hiatal hernia    Hyperlipidemia    Hypertension    Neuropathy    left leg   Osteoporosis    Peripheral vascular disease ([place])    Pneumonia    "very young" and a few years ago   Seasonal allergies    Shortness of breath dyspnea    WIth exertion, when fluid builds   Sleep apnea    can't afford cpap    Past Surgical History:  Procedure Laterality Date   A/V SHUNTOGRAM N/A 09/22/2016   Procedure: A/V Shuntogram - left arm;  Surgeon: Serafina Mitchell, MD;  Location: [facility];  Service: Cardiovascular;  Laterality: N/A;   A/V SHUNTOGRAM N/A 03/22/2018   Procedure: A/V SHUNTOGRAM - left arm;  Surgeon: Serafina Mitchell, MD;  Location: [facility];  Service: Cardiovascular;  Laterality: N/A;   A/V SHUNTOGRAM Left 11/17/2018   Procedure: A/V SHUNTOGRAM;  Surgeon: Angelia Mould, MD;  Location: [facility];  Service: Cardiovascular;  Laterality: Left;    ABDOMINAL HYSTERECTOMY  1993`   AMPUTATION Left 09/01/2016   Procedure: LEFT FOOT TRANSMETATARSAL AMPUTATION;  Surgeon: Newt Minion, MD;  Location: [facility];  Service: Orthopedics;  Laterality: Left;   AMPUTATION Right 08/11/2017   Procedure: RIGHT GREAT TOE AMPUTATION DIGIT;  Surgeon: Rosetta Posner, MD;  Location: [facility];  Service: Vascular;  Laterality: Right;   AMPUTATION Right 12/31/2017   Procedure: RIGHT TRANSMETATARSAL AMPUTATION;  Surgeon: Newt Minion, MD;  Location: [facility];  Service: Orthopedics;  Laterality: Right;   AV FISTULA PLACEMENT Left 04/21/2016   Procedure: INSERTION OF ARTERIOVENOUS (AV) GORE-TEX GRAFT ARM LEFT;  Surgeon: Jessy Oto  Fields, MD;  Location: [facility];  Service: Vascular;  Laterality: Left;   [place] Left 07/10/2014   Procedure: BASCILIC VEIN TRANSPOSITION;  Surgeon: Angelia Mould, MD;  Location: [facility];  Service: Vascular;  Laterality: Left;   [place] Right 11/08/2014   Procedure: FIRST STAGE BASILIC VEIN TRANSPOSITION;  Surgeon: Angelia Mould, MD;  Location: [facility];  Service: Vascular;  Laterality: Right;   [place] Right 01/18/2015   Procedure: SECOND STAGE BASILIC VEIN TRANSPOSITION;  Surgeon: Angelia Mould, MD;  Location: [facility];  Service: Vascular;  Laterality: Right;   CRANIOTOMY N/A 08/23/2017   Procedure: CRANIOTOMY HEMATOMA EVACUATION SUBDURAL;  Surgeon: Ashok Pall, MD;  Location: [facility];  Service: Neurosurgery;  Laterality: N/A;   CRANIOTOMY Left 08/24/2017   Procedure: CRANIOTOMY FOR RECURRENT ACUTE SUBDURAL HEMATOMA;  Surgeon: Ashok Pall, MD;  Location: [facility];  Service: Neurosurgery;  Laterality: Left;   ESOPHAGOGASTRODUODENOSCOPY N/A 03/31/2013   Procedure: ESOPHAGOGASTRODUODENOSCOPY (EGD);  Surgeon: Gatha Mayer, MD;  Location: [facility];  Service: Endoscopy;  Laterality: N/A;   EYE SURGERY     laser surgery   FEMORAL-POPLITEAL BYPASS GRAFT Left 07/08/2016   Procedure:  LEFT  FEMORAL-BELOW KNEE POPLITEAL ARTERY BYPASS GRAFT USING 6MM X 80 CM PROPATEN GORETEX GRAFT WITH RINGS.;  Surgeon: Rosetta Posner, MD;  [facility];  Service: Vascular;  Laterality: Left;   FEMORAL-POPLITEAL BYPASS GRAFT Right 08/11/2017   Procedure: RIGHT FEMORAL TO BELOW KNEE POPLITEAL ARTERKY  BYPASS GRAFT USING 6MM RINGED PROPATEN GRAFT;  Surgeon: Rosetta Posner, MD;  Location: [facility];  Service: Vascular;  Laterality: Right;   FISTULOGRAM Left 10/29/2014   Procedure: FISTULOGRAM;  Surgeon: Angelia Mould, MD;  Location: [facility];  Service: Cardiovascular;  Laterality: Left;   GAS/FLUID EXCHANGE Left 12/20/2018   Procedure: AIR GAS EXCHANGE;  Surgeon: Jalene Mullet, MD;  Location: [facility];  Service: Ophthalmology;  Laterality: Left;   INSERTION OF DIALYSIS CATHETER N/A 09/05/2020   Procedure: INSERTION OF DIALYSIS CATHETER;  Surgeon: Cherre Robins, MD;  Location: [facility];  Service: Vascular;  Laterality: N/A;   INSERTION OF DIALYSIS CATHETER Left 03/13/2021   Procedure: INSERTION OF DIALYSIS CATHETER;  Surgeon: Waynetta Sandy, MD;  Location: [facility];  Service: Vascular;  Laterality: Left;   KNEE ARTHROSCOPY Right 05/06/2018   Procedure: RIGHT KNEE ARTHROSCOPY;  Surgeon: Newt Minion, MD;  Location: [facility];  Service: Orthopedics;  Laterality: Right;   KNEE ARTHROSCOPY Right 06/10/2018   Procedure: RIGHT KNEE ARTHROSCOPY AND DEBRIDEMENT;  Surgeon: Newt Minion, MD;  Location: [facility];  Service: Orthopedics;  Laterality: Right;   LIGATION OF ARTERIOVENOUS  FISTULA Left 04/21/2016   Procedure: LIGATION OF ARTERIOVENOUS  FISTULA LEFT ARM;  Surgeon: Elam Dutch, MD;  Location: [facility];  Service: Vascular;  Laterality: Left;   LOWER EXTREMITY ANGIOGRAPHY N/A 04/06/2017   Procedure: Lower Extremity Angiography - Right;  Surgeon: Serafina Mitchell, MD;  Location: [facility];  Service: Cardiovascular;  Laterality: N/A;   MEMBRANE PEEL Left 12/20/2018   Procedure: MEMBRANE PEEL;   Surgeon: Jalene Mullet, MD;  Location: [facility];  Service: Ophthalmology;  Laterality: Left;   PATCH ANGIOPLASTY Right 01/18/2015   Procedure: BASILIC VEIN PATCH ANGIOPLASTY USING VASCUGUARD PATCH;  Surgeon: Angelia Mould, MD;  Location: [facility];  Service: Vascular;  Laterality: Right;   PERIPHERAL VASCULAR BALLOON ANGIOPLASTY  09/22/2016   Procedure: Peripheral Vascular Balloon Angioplasty;  Surgeon: Butch Penny  Trula Slade, MD;  Location: [facility];  Service: Cardiovascular;;  Lt. Fistula   PERIPHERAL VASCULAR BALLOON ANGIOPLASTY Left 03/22/2018   Procedure: PERIPHERAL VASCULAR BALLOON ANGIOPLASTY;  Surgeon: Serafina Mitchell, MD;  Location: [facility];  Service: Cardiovascular;  Laterality: Left;  Arm shunt   PERIPHERAL VASCULAR CATHETERIZATION N/A 06/23/2016   Procedure: Abdominal Aortogram w/Lower Extremity;  Surgeon: Serafina Mitchell, MD;  Location: [facility];  Service: Cardiovascular;  Laterality: N/A;   PERIPHERAL VASCULAR CATHETERIZATION  06/23/2016   Procedure: Peripheral Vascular Intervention;  Surgeon: Serafina Mitchell, MD;  Location: [name];  Service: Cardiovascular;;  lt common and external illiac artery   PHOTOCOAGULATION WITH LASER Left 12/20/2018   Procedure: PHOTOCOAGULATION WITH LASER;  Surgeon: Jalene Mullet, MD;  Location: [facility];  Service: Ophthalmology;  Laterality: Left;   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   Procedure: REPAIR OF COMPLEX TRACTION RETINAL DETACHMENT;  Surgeon: Jalene Mullet, MD;  Location: [facility];  Service: Ophthalmology;  Laterality: Left;   REVISION OF ARTERIOVENOUS GORETEX GRAFT Left 09/05/2020   Procedure: REVISION OF ARTERIOVENOUS GORETEX GRAFT LEFT;  Surgeon: Cherre Robins, MD;  Location[facility];  Service: Vascular;  Laterality: Left;    FAMILY HISTORY Family History  Problem Relation Age of Onset   Other Mother        not sure of cause of death   Diabetes Father    Diabetes Sister     Diabetes Sister    Pancreatic cancer Maternal Grandmother    Colon cancer Neg Hx     SOCIAL HISTORY Social History   Tobacco Use   Smoking status: Former    Packs/day: 0.35    Years: 40.00    Pack years: 14.00    Types: Cigarettes    Quit date: 05/10/2012    Years since quitting: 9.0   Smokeless tobacco: Never  Vaping Use   Vaping Use: Never used  Substance Use Topics   Alcohol use: No   Drug use: No    Comment: marijuana; quit in early 1980's         OPHTHALMIC EXAM:  Base Eye Exam     Visual Acuity (ETDRS)       Right Left   Dist [place] 20/100 -2    Dist ph [place] 20/100          Tonometry (Tonopen, 8:56 AM)       Right Left   Pressure 21          Pupils       Dark Light Shape React APD   Right Dilated    None   Left 3 3 Round Minimal None         Visual Fields (Counting fingers)       Left Right    Full Full         Extraocular Movement       Right Left    Full, Ortho Full, Ortho         Neuro/Psych     Oriented x3: Yes   Mood/Affect: Normal           Slit Lamp and Fundus Exam     External Exam       Right Left   External Normal          Slit Lamp Exam       Right Left   Lids/Lashes Normal    Conjunctiva/Sclera White and quiet  Cornea Clear    Anterior Chamber Deep and quiet    [place]ris Round and reactive    Lens Centered posterior chamber intraocular lens    Anterior Vitreous Normal          Fundus Exam       Right Left   Posterior Vitreous Clear, avitric    Disc normal    C/D Ratio 0.5    Macula Microaneurysms    Periphery Retina attached NVE ,, excellent PRP             IMAGING AND PROCEDURES  Imaging and Procedures for 05/08/21           ASSESSMENT/PLAN:  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  Epiretinal  membrane removed.  Post PRP, retina attached clear vitreous today  Vitreomacular traction syndrome, right OD looks great, postop day #1 vitrectomy and release of VMT component of traction retinal detachment     ICD-10-CM   1. Vitreomacular traction syndrome, right  H43.821       1.  Postop day #1 status post vitrectomy membrane peel as well as PRP and resection of posterior segment NVE.  OD looks great status post resection of posterior segment neovascularization.  Intermittent fibrous disease prevented complete resection of peripheral hyaloid from near the equator and anterior.  These areas were shaved.  Good PRP delivered 360.  Clear vitreous today  2.  Patient to resume topical medications.  3.  Prognosis of the right eye reviewed.  Will reevaluate left eye in the near future.  Ophthalmic Meds Ordered this visit:  No orders of the defined types were placed in this encounter.      Return in about 1 week (around 05/15/2021) for dilate, OD, COLOR FP, OCT.  Patient Instructions  Ofloxacin  4 times daily to the operative eye  Prednisolone acetate 1 drop to the operative eye 4 times daily  Patient instructed not to refill the medications and use them for maximum of 3 weeks.  Patient instructed do not rub the eye.  Patient has the option to use the patch at night.   No lifting and bending for 1 week. No water IN the eye for 10 days. Do not rub the eye. Wear shield at night for 1-3 days.  Continue your topical medications for a total of 3 weeks.  Do not refill your postoperative medications unless instructed.  Refrain from exercise or intentional activity which increases our heart rate above resting levels.  Normal walking to complete normal activities of your day are appropriate.  Driving:  Legally, you only need one good eye, of 20/40 or better to drive.  However, the practice does not recommend driving during first weeks after surgery, IF you are uncomfortable with your visual  functioning or capabilities.   If you have known sleep apnea, wear your CPAP as you normally should.    Explained the diagnoses, plan, and follow up with the patient and they expressed understanding.  Patient expressed understanding of the importance of proper follow up care.   Clent Demark Mann Skaggs M.D. Diseases & Surgery of the Retina and Vitreous Retina & Diabetic [place] 05/08/21     Abbreviations: M myopia (nearsighted); A astigmatism; H hyperopia (farsighted); P presbyopia; Mrx spectacle prescription;  CTL contact lenses; OD right eye; OS left eye; OU both eyes  XT exotropia; ET esotropia; PEK punctate epithelial  keratitis; PEE punctate epithelial erosions; DES dry eye syndrome; MGD meibomian gland dysfunction; ATs artificial tears; PFAT's preservative free artificial tears; [place] nuclear sclerotic cataract; PSC posterior subcapsular cataract; ERM epi-retinal membrane; PVD posterior vitreous detachment; RD retinal detachment; DM diabetes mellitus; DR diabetic retinopathy; NPDR non-proliferative diabetic retinopathy; PDR proliferative diabetic retinopathy; CSME clinically significant macular edema; DME diabetic macular edema; dbh dot blot hemorrhages; CWS cotton wool spot; POAG primary open angle glaucoma; C/D cup-to-disc ratio; HVF humphrey visual field; GVF goldmann visual field; OCT optical coherence tomography; IOP intraocular pressure; BRVO Branch retinal vein occlusion; CRVO central retinal vein occlusion; CRAO central retinal artery occlusion; BRAO branch retinal artery occlusion; RT retinal tear; SB scleral buckle; PPV pars plana vitrectomy; VH Vitreous hemorrhage; PRP panretinal laser photocoagulation; IVK intravitreal kenalog; VMT vitreomacular traction; MH Macular hole;  NVD neovascularization of the disc; NVE neovascularization elsewhere; AREDS age related eye disease study; ARMD age related macular degeneration; POAG primary open angle glaucoma; EBMD epithelial/anterior basement membrane  dystrophy; ACIOL anterior chamber intraocular lens; IOL intraocular lens; PCIOL posterior chamber intraocular lens; Phaco/IOL phacoemulsification with intraocular lens placement; [place] photorefractive keratectomy; LASIK laser assisted in situ keratomileusis; HTN hypertension; DM diabetes mellitus; COPD chronic obstructive pulmonary disease

## 2021-05-08 NOTE — Patient Instructions (Signed)
Ofloxacin  4 times daily to the operative eye  Prednisolone acetate 1 drop to the operative eye 4 times daily  Patient instructed not to refill the medications and use them for maximum of 3 weeks.  Patient instructed do not rub the eye.  Patient has the option to use the patch at night.   No lifting and bending for 1 week. No water IN the eye for 10 days. Do not rub the eye. Wear shield at night for 1-3 days.  Continue your topical medications for a total of 3 weeks.  Do not refill your postoperative medications unless instructed.  Refrain from exercise or intentional activity which increases our heart rate above resting levels.  Normal walking to complete normal activities of your day are appropriate.  Driving:  Legally, you only need one good eye, of 20/40 or better to drive.  However, the practice does not recommend driving during first weeks after surgery, IF you are uncomfortable with your visual functioning or capabilities.   If you have known sleep apnea, wear your CPAP as you normally should.  

## 2021-05-08 NOTE — Assessment & Plan Note (Signed)
Postop day #1 resection of tractional detachment involving the macula from proliferative diabetic retinopathy.  Epiretinal membrane removed.  Post PRP, retina attached clear vitreous today

## 2021-05-09 DIAGNOSIS — N186 End stage renal disease: Secondary | ICD-10-CM | POA: Diagnosis not present

## 2021-05-09 DIAGNOSIS — D509 Iron deficiency anemia, unspecified: Secondary | ICD-10-CM | POA: Diagnosis not present

## 2021-05-09 DIAGNOSIS — Z992 Dependence on renal dialysis: Secondary | ICD-10-CM | POA: Diagnosis not present

## 2021-05-09 DIAGNOSIS — E876 Hypokalemia: Secondary | ICD-10-CM | POA: Diagnosis not present

## 2021-05-09 DIAGNOSIS — D631 Anemia in chronic kidney disease: Secondary | ICD-10-CM | POA: Diagnosis not present

## 2021-05-09 DIAGNOSIS — N2581 Secondary hyperparathyroidism of renal origin: Secondary | ICD-10-CM | POA: Diagnosis not present

## 2021-05-10 DIAGNOSIS — Z992 Dependence on renal dialysis: Secondary | ICD-10-CM | POA: Diagnosis not present

## 2021-05-10 DIAGNOSIS — E876 Hypokalemia: Secondary | ICD-10-CM | POA: Diagnosis not present

## 2021-05-10 DIAGNOSIS — D631 Anemia in chronic kidney disease: Secondary | ICD-10-CM | POA: Diagnosis not present

## 2021-05-10 DIAGNOSIS — N2581 Secondary hyperparathyroidism of renal origin: Secondary | ICD-10-CM | POA: Diagnosis not present

## 2021-05-10 DIAGNOSIS — N186 End stage renal disease: Secondary | ICD-10-CM | POA: Diagnosis not present

## 2021-05-10 DIAGNOSIS — D509 Iron deficiency anemia, unspecified: Secondary | ICD-10-CM | POA: Diagnosis not present

## 2021-05-12 DIAGNOSIS — Z992 Dependence on renal dialysis: Secondary | ICD-10-CM | POA: Diagnosis not present

## 2021-05-12 DIAGNOSIS — N186 End stage renal disease: Secondary | ICD-10-CM | POA: Diagnosis not present

## 2021-05-12 DIAGNOSIS — E876 Hypokalemia: Secondary | ICD-10-CM | POA: Diagnosis not present

## 2021-05-12 DIAGNOSIS — D631 Anemia in chronic kidney disease: Secondary | ICD-10-CM | POA: Diagnosis not present

## 2021-05-12 DIAGNOSIS — D509 Iron deficiency anemia, unspecified: Secondary | ICD-10-CM | POA: Diagnosis not present

## 2021-05-12 DIAGNOSIS — N2581 Secondary hyperparathyroidism of renal origin: Secondary | ICD-10-CM | POA: Diagnosis not present

## 2021-05-13 ENCOUNTER — Ambulatory Visit (INDEPENDENT_AMBULATORY_CARE_PROVIDER_SITE_OTHER): Payer: Medicare Other

## 2021-05-13 DIAGNOSIS — J309 Allergic rhinitis, unspecified: Secondary | ICD-10-CM | POA: Diagnosis not present

## 2021-05-14 ENCOUNTER — Encounter (INDEPENDENT_AMBULATORY_CARE_PROVIDER_SITE_OTHER): Payer: Medicare Other | Admitting: Ophthalmology

## 2021-05-14 DIAGNOSIS — D631 Anemia in chronic kidney disease: Secondary | ICD-10-CM | POA: Diagnosis not present

## 2021-05-14 DIAGNOSIS — D509 Iron deficiency anemia, unspecified: Secondary | ICD-10-CM | POA: Diagnosis not present

## 2021-05-14 DIAGNOSIS — E876 Hypokalemia: Secondary | ICD-10-CM | POA: Diagnosis not present

## 2021-05-14 DIAGNOSIS — N2581 Secondary hyperparathyroidism of renal origin: Secondary | ICD-10-CM | POA: Diagnosis not present

## 2021-05-14 DIAGNOSIS — N186 End stage renal disease: Secondary | ICD-10-CM | POA: Diagnosis not present

## 2021-05-14 DIAGNOSIS — Z992 Dependence on renal dialysis: Secondary | ICD-10-CM | POA: Diagnosis not present

## 2021-05-15 ENCOUNTER — Ambulatory Visit (INDEPENDENT_AMBULATORY_CARE_PROVIDER_SITE_OTHER): Payer: Medicare Other | Admitting: Ophthalmology

## 2021-05-15 ENCOUNTER — Other Ambulatory Visit: Payer: Self-pay

## 2021-05-15 ENCOUNTER — Encounter (INDEPENDENT_AMBULATORY_CARE_PROVIDER_SITE_OTHER): Payer: Self-pay | Admitting: Ophthalmology

## 2021-05-15 DIAGNOSIS — E113521 Type 2 diabetes mellitus with proliferative diabetic retinopathy with traction retinal detachment involving the macula, right eye: Secondary | ICD-10-CM

## 2021-05-15 DIAGNOSIS — H35371 Puckering of macula, right eye: Secondary | ICD-10-CM | POA: Diagnosis not present

## 2021-05-15 DIAGNOSIS — E113511 Type 2 diabetes mellitus with proliferative diabetic retinopathy with macular edema, right eye: Secondary | ICD-10-CM | POA: Diagnosis not present

## 2021-05-15 DIAGNOSIS — E113512 Type 2 diabetes mellitus with proliferative diabetic retinopathy with macular edema, left eye: Secondary | ICD-10-CM | POA: Diagnosis not present

## 2021-05-15 DIAGNOSIS — H43821 Vitreomacular adhesion, right eye: Secondary | ICD-10-CM | POA: Diagnosis not present

## 2021-05-15 NOTE — Assessment & Plan Note (Signed)
OD vastly improved with improved acuity from 20/100 to now 20/50

## 2021-05-15 NOTE — Assessment & Plan Note (Signed)
Vastly improved macular anatomy post vitrectomy and release of VMT and tractional detachment diabetic retinopathy

## 2021-05-15 NOTE — Assessment & Plan Note (Signed)
Minor residual CSME OD we will continue to monitor and observe

## 2021-05-15 NOTE — Assessment & Plan Note (Signed)
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

## 2021-05-15 NOTE — Progress Notes (Signed)
05/15/2021     CHIEF COMPLAINT Patient presents for  Chief Complaint  Patient presents with   Post-op Follow-up      HISTORY OF PRESENT ILLNESS: Destiny Day is a 70 y.o. female who presents to the clinic today for:   HPI     Post-op Follow-up   In right eye.  Discomfort includes tearing.  Negative for pain, itching and foreign body sensation.  Vision is improved.        Comments   1 week post op od and oct sx 05/07/2021 Pt states, "things have been good so far. I feel like I can see a little better. I need to know if I need to keep using my drops." Pt reports using Cipro and Pred QID OD       Last edited by Kendra Opitz, COA on 05/15/2021 10:46 AM.      Referring physician: Cipriano Mile, NP [place],  [place] [address]  HISTORICAL INFORMATION:   Selected notes from the MEDICAL RECORD NUMBER    Lab Results  Component Value Date   HGBA1C 5.6 06/26/2019     CURRENT MEDICATIONS: No current outpatient medications on file. (Ophthalmic Drugs)   No current facility-administered medications for this visit. (Ophthalmic Drugs)   Current Outpatient Medications (Other)  Medication Sig   albuterol (PROAIR HFA) 108 (90 Base) MCG/ACT inhaler Inhale 2 puffs into the lungs every 4 (four) hours as needed for wheezing or shortness of breath.   amLODipine (NORVASC) 10 MG tablet Take 10 mg by mouth at bedtime.   aspirin EC 81 MG tablet Take 81 mg by mouth daily.   atorvastatin (LIPITOR) 40 MG tablet Take 1 tablet (40 mg total) by mouth daily.   busPIRone (BUSPAR) 7.5 MG tablet Take 7.5 mg by mouth 2 (two) times daily.   carvedilol (COREG) 12.5 MG tablet Take 12.5 mg by mouth See admin instructions. Take 1 tablet (12.5 mg) by mouth once daily in the evening on Mondays, Wednesdays, & Fridays. (Dialysis) Take 1 tablet (12.5 mg) by mouth twice daily on Sundays, Tuesdays, Thursdays, & Saturdays. (Non Dialysis)   cinacalcet (SENSIPAR) 60 MG tablet Take 90  mg by mouth See admin instructions. Take 1 tablet (90 mg) by mouth in the evening on Mondays, Wednesdays, & Fridays (dialysis) Take 1 tablet (90 mg) by mouth twice daily on Sundays, Tuesdays, Thursdays, Saturdays. (non dialysis)   docusate sodium (COLACE) 100 MG capsule Take 1 capsule (100 mg total) by mouth 2 (two) times daily.   EPINEPHrine (AUVI-Q) 0.3 mg/0.3 mL IJ SOAJ injection Inject 0.3 mg into the muscle as needed for anaphylaxis.   ethyl chloride spray SMARTSIG:1 Spray(s) Topical 3 Times a Week   famotidine (PEPCID) 10 MG tablet Take 1 tablet (10 mg total) by mouth every other day.   ferric citrate (AURYXIA) 1 GM 210 MG(Fe) tablet Take 210-420 mg by mouth See admin instructions. Take 2 tablets (420 mg) by mouth with each meal & take 1 tablet (210 mg) by mouth with snacks.   fluticasone (FLONASE) 50 MCG/ACT nasal spray Place 2 sprays into both nostrils daily as needed for allergies or rhinitis.   fluticasone-salmeterol (ADVAIR HFA) 115-21 MCG/ACT inhaler Inhale two puffs twice daily   gabapentin (NEURONTIN) 100 MG capsule TAKE 1 CAPSULE (100 MG TOTAL) BY MOUTH AT BEDTIME. WHEN NECESSARY FOR NEUROPATHY PAIN (Patient taking differently: Take 100 mg by mouth at bedtime as needed (pain). When necessary for neuropathy pain)   glipiZIDE (GLUCOTROL) 5  MG tablet Take 5 mg by mouth daily before breakfast.   ipratropium (ATROVENT) 0.06 % nasal spray Place 2 sprays into both nostrils 4 (four) times daily.   Isosorb Dinitrate-hydrALAZINE (BIDIL PO) Take by mouth.   isosorbide-hydrALAZINE (BIDIL) 20-37.5 MG tablet Take 1 tablet by mouth 3 (three) times daily. BID on days of dialysis   lidocaine-prilocaine (EMLA) cream Apply 1 application topically 3 (three) times a week. 1-2 hours prior to dialysis   montelukast (SINGULAIR) 10 MG tablet TAKE 1 TABLET BY MOUTH EVERYDAY AT BEDTIME   multivitamin (RENA-VIT) TABS tablet Take 1 tablet by mouth daily.    omeprazole (PRILOSEC) 20 MG capsule Take 20 mg by mouth  daily.    ondansetron (ZOFRAN) 4 MG tablet Take 1 tablet (4 mg total) by mouth every 6 (six) hours as needed for nausea.   polyethylene glycol (MIRALAX / GLYCOLAX) 17 g packet Take 17 g by mouth daily.   Spacer/Aero-Holding Chambers (AEROCHAMBER PLUS) inhaler Use with inhaler   traZODone (DESYREL) 50 MG tablet Take 50 mg by mouth at bedtime as needed for sleep.   Vitamin D, Ergocalciferol, (DRISDOL) 1.25 MG (50000 UT) CAPS capsule Take 50,000 Units by mouth every Sunday. Takes on Sunday   No current facility-administered medications for this visit. (Other)      REVIEW OF SYSTEMS:    ALLERGIES Allergies  Allergen Reactions   Adhesive  [Tape] Hives   Lisinopril Cough   Prednisone Swelling and Other (See Comments)    Excessive fluid buildup    PAST MEDICAL HISTORY Past Medical History:  Diagnosis Date   Abdominal bruit    Anxiety    Arthritis    Osteoarthritis   Asthma    Cervical disc disease    "pinced nerve"   CHF (congestive heart failure) (HCC)    Diabetes mellitus    Type II   Diverticulitis    ESRD (end stage renal disease) ([place])    dialysis - M/W/F- [place]   GERD (gastroesophageal reflux disease)    from medications   GI bleed 03/31/2013   Head injury 07/2017   History of hiatal hernia    Hyperlipidemia    Hypertension    Neuropathy    left leg   Osteoporosis    Peripheral vascular disease ([place])    Pneumonia    "very young" and a few years ago   Seasonal allergies    Shortness of breath dyspnea    WIth exertion, when fluid builds   Sleep apnea    can't afford cpap    Past Surgical History:  Procedure Laterality Date   A/V SHUNTOGRAM N/A 09/22/2016   Procedure: A/V Shuntogram - left arm;  Surgeon: Serafina Mitchell, MD;  Location: [facility];  Service: Cardiovascular;  Laterality: N/A;   A/V SHUNTOGRAM N/A 03/22/2018   Procedure: A/V SHUNTOGRAM - left arm;  Surgeon: Serafina Mitchell, MD;  Location: [facility];  Service: Cardiovascular;   Laterality: N/A;   A/V SHUNTOGRAM Left 11/17/2018   Procedure: A/V SHUNTOGRAM;  Surgeon: Angelia Mould, MD;  Location: [facility];  Service: Cardiovascular;  Laterality: Left;   ABDOMINAL HYSTERECTOMY  1993`   AMPUTATION Left 09/01/2016   Procedure: LEFT FOOT TRANSMETATARSAL AMPUTATION;  Surgeon: Newt Minion, MD;  Location: [facility];  Service: Orthopedics;  Laterality: Left;   AMPUTATION Right 08/11/2017   Procedure: RIGHT GREAT TOE AMPUTATION DIGIT;  Surgeon: Rosetta Posner, MD;  Location: [facility];  Service: Vascular;  Laterality:  Right;   AMPUTATION Right 12/31/2017   Procedure: RIGHT TRANSMETATARSAL AMPUTATION;  Surgeon: Newt Minion, MD;  Location: [facility];  Service: Orthopedics;  Laterality: Right;   AV FISTULA PLACEMENT Left 04/21/2016   Procedure: INSERTION OF ARTERIOVENOUS (AV) GORE-TEX GRAFT ARM LEFT;  Surgeon: Elam Dutch, MD;  Location: [facility];  Service: Vascular;  Laterality: Left;   BASCILIC VEIN TRANSPOSITION Left 07/10/2014   Procedure: BASCILIC VEIN TRANSPOSITION;  Surgeon: Angelia Mould, MD;  Location: [facility];  Service: Vascular;  Laterality: Left;   [place] Right 11/08/2014   Procedure: FIRST STAGE BASILIC VEIN TRANSPOSITION;  Surgeon: Angelia Mould, MD;  Location: [facility];  Service: Vascular;  Laterality: Right;   [place] Right 01/18/2015   Procedure: SECOND STAGE BASILIC VEIN TRANSPOSITION;  Surgeon: Angelia Mould, MD;  Location: [facility];  Service: Vascular;  Laterality: Right;   CRANIOTOMY N/A 08/23/2017   Procedure: CRANIOTOMY HEMATOMA EVACUATION SUBDURAL;  Surgeon: Ashok Pall, MD;  Location: [facility];  Service: Neurosurgery;  Laterality: N/A;   CRANIOTOMY Left 08/24/2017   Procedure: CRANIOTOMY FOR RECURRENT ACUTE SUBDURAL HEMATOMA;  Surgeon: Ashok Pall, MD;  Location: [facility];  Service: Neurosurgery;  Laterality: Left;   ESOPHAGOGASTRODUODENOSCOPY N/A 03/31/2013   Procedure: ESOPHAGOGASTRODUODENOSCOPY  (EGD);  Surgeon: Gatha Mayer, MD;  Location[facility] ENDOSCOPY;  Service: Endoscopy;  Laterality: N/A;   EYE SURGERY     laser surgery   FEMORAL-POPLITEAL BYPASS GRAFT Left 07/08/2016   Procedure: LEFT  FEMORAL-BELOW KNEE POPLITEAL ARTERY BYPASS GRAFT USING 6MM X 80 CM PROPATEN GORETEX GRAFT WITH RINGS.;  Surgeon: Rosetta Posner, MD;  Location[name];  Service: Vascular;  Laterality: Left;   FEMORAL-POPLITEAL BYPASS GRAFT Right 08/11/2017   Procedure: RIGHT FEMORAL TO BELOW KNEE POPLITEAL ARTERKY  BYPASS GRAFT USING 6MM RINGED PROPATEN GRAFT;  Surgeon: Rosetta Posner, MD;  Location: [facility];  Service: Vascular;  Laterality: Right;   FISTULOGRAM Left 10/29/2014   Procedure: FISTULOGRAM;  Surgeon: Angelia Mould, MD;  Location: [facility] CATH LAB;  Service: Cardiovascular;  Laterality: Left;   GAS/FLUID EXCHANGE Left 12/20/2018   Procedure: AIR GAS EXCHANGE;  Surgeon: Jalene Mullet, MD;  Location: [place] [place];  Service: Ophthalmology;  Laterality: Left;   INSERTION OF DIALYSIS CATHETER N/A 09/05/2020   Procedure: INSERTION OF DIALYSIS CATHETER;  Surgeon: Cherre Robins, MD;  Location: [facility];  Service: Vascular;  Laterality: N/A;   INSERTION OF DIALYSIS CATHETER Left 03/13/2021   Procedure: INSERTION OF DIALYSIS CATHETER;  Surgeon: Waynetta Sandy, MD;  Location: [facility];  Service: Vascular;  Laterality: Left;   KNEE ARTHROSCOPY Right 05/06/2018   Procedure: RIGHT KNEE ARTHROSCOPY;  Surgeon: Newt Minion, MD;  Location: [facility];  Service: Orthopedics;  Laterality: Right;   KNEE ARTHROSCOPY Right 06/10/2018   Procedure: RIGHT KNEE ARTHROSCOPY AND DEBRIDEMENT;  Surgeon: Newt Minion, MD;  Location: [place];  Service: Orthopedics;  Laterality: Right;   LIGATION OF ARTERIOVENOUS  FISTULA Left 04/21/2016   Procedure: LIGATION OF ARTERIOVENOUS  FISTULA LEFT ARM;  Surgeon: Elam Dutch, MD;  Location: [facility];  Service: Vascular;  Laterality: Left;   LOWER EXTREMITY ANGIOGRAPHY N/A 04/06/2017   Procedure: Lower  Extremity Angiography - Right;  Surgeon: Serafina Mitchell, MD;  Location: [facility];  Service: Cardiovascular;  Laterality: N/A;   MEMBRANE PEEL Left 12/20/2018   Procedure: MEMBRANE PEEL;  Surgeon: Jalene Mullet, MD;  Location: [facility];  Service: Ophthalmology;  Laterality: Left;  PATCH ANGIOPLASTY Right 01/18/2015   Procedure: BASILIC VEIN PATCH ANGIOPLASTY USING VASCUGUARD PATCH;  Surgeon: Angelia Mould, MD;  Location: [place] [facility];  Service: Vascular;  Laterality: Right;   PERIPHERAL VASCULAR BALLOON ANGIOPLASTY  09/22/2016   Procedure: Peripheral Vascular Balloon Angioplasty;  Surgeon: Serafina Mitchell, MD;  Location: [facility];  Service: Cardiovascular;;  Lt. Fistula   PERIPHERAL VASCULAR BALLOON ANGIOPLASTY Left 03/22/2018   Procedure: PERIPHERAL VASCULAR BALLOON ANGIOPLASTY;  Surgeon: Serafina Mitchell, MD;  Location: [facility];  Service: Cardiovascular;  Laterality: Left;  Arm shunt   PERIPHERAL VASCULAR CATHETERIZATION N/A 06/23/2016   Procedure: Abdominal Aortogram w/Lower Extremity;  Surgeon: Serafina Mitchell, MD;  Location: [facility];  Service: Cardiovascular;  Laterality: N/A;   PERIPHERAL VASCULAR CATHETERIZATION  06/23/2016   Procedure: Peripheral Vascular Intervention;  Surgeon: Serafina Mitchell, MD;  Location: [facility];  Service: Cardiovascular;;  lt common and external illiac artery   PHOTOCOAGULATION WITH LASER Left 12/20/2018   Procedure: PHOTOCOAGULATION WITH LASER;  Surgeon: Jalene Mullet, MD;  Location: [facility];  Service: Ophthalmology;  Laterality: Left;   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   Procedure: REPAIR OF COMPLEX TRACTION RETINAL DETACHMENT;  Surgeon: Jalene Mullet, MD;  Location: [facility];  Service: Ophthalmology;  Laterality: Left;   REVISION OF ARTERIOVENOUS GORETEX GRAFT Left 09/05/2020   Procedure: REVISION OF ARTERIOVENOUS GORETEX GRAFT LEFT;  Surgeon: Cherre Robins, MD;  Location: [facility];   Service: Vascular;  Laterality: Left;    FAMILY HISTORY Family History  Problem Relation Age of Onset   Other Mother        not sure of cause of death   Diabetes Father    Diabetes Sister    Diabetes Sister    Pancreatic cancer Maternal Grandmother    Colon cancer Neg Hx     SOCIAL HISTORY Social History   Tobacco Use   Smoking status: Former    Packs/day: 0.35    Years: 40.00    Pack years: 14.00    Types: Cigarettes    Quit date: 05/10/2012    Years since quitting: 9.0   Smokeless tobacco: Never  Vaping Use   Vaping Use: Never used  Substance Use Topics   Alcohol use: No   Drug use: No    Comment: marijuana; quit in early 1980's         OPHTHALMIC EXAM:  Base Eye Exam     Visual Acuity (ETDRS)       Right Left   Dist [place] 20/50 -1 20/80   Dist ph [place] NI NI         Tonometry (Tonopen, 10:51 AM)       Right Left   Pressure 15 17         Pupils       Pupils Dark Light Shape React APD   Right PERRL 3 3 Round Minimal None   Left PERRL 3 3 Round Minimal None         Visual Fields       Left Right   Restrictions  Partial outer inferior nasal deficiency         Neuro/Psych     Oriented x3: Yes   Mood/Affect: Normal         Dilation     Right eye: 1.0% Mydriacyl, 2.5% Phenylephrine @ 10:51 AM           Slit Lamp and Fundus Exam  External Exam       Right Left   External Normal          Slit Lamp Exam       Right Left   Lids/Lashes Normal    Conjunctiva/Sclera White and quiet    Cornea Clear    Anterior Chamber Deep and quiet    Iris Round and reactive    Lens Centered posterior chamber intraocular lens    Anterior Vitreous Normal          Fundus Exam       Right Left   Posterior Vitreous Clear, avitric    Disc normal    C/D Ratio 0.5    Macula Microaneurysms, much less topographic distortion    Vessels PDR-active,     Periphery Retina attached  ,, excellent PRP             IMAGING AND  PROCEDURES  Imaging and Procedures for 05/15/21  OCT, Retina - OU - Both Eyes       Right Eye Quality was good. Scan locations included subfoveal. Central Foveal Thickness: 395. Progression has improved. Findings include abnormal foveal contour.   Left Eye Quality was good. Scan locations included subfoveal, extrafoveal. Central Foveal Thickness: 433. Progression has been stable. Findings include abnormal foveal contour, subretinal fluid.   Notes OD, vastly improved macular anatomy, still with a taut ILM on the temporal aspect of the macula yet no vitreomacular traction remains and only minor CSME remains OD                 ASSESSMENT/PLAN:  Vitreomacular traction syndrome, right Vastly improved macular anatomy post vitrectomy and release of VMT and tractional detachment diabetic retinopathy  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 ([place]) Vastly improved macular anatomy post vitrectomy and release of VMT and tractional detachment diabetic retinopathy  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 type 2 diabetes mellitus (HCC)  OS with massive CSME persistent resistant to therapy will continue to treat as scheduled  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 Vitreomacular traction syndrome, right  H43.821 OCT, Retina - OU - Both Eyes    2. 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, associated with type 2 diabetes mellitus ([place])  O35.0093     3. 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 Diabetic macular edema of right eye with proliferative retinopathy associated with type 2 diabetes mellitus ([place])  E11.3511     5. Macular pucker, right eye  H35.371       OD, vastly improved in acuity as well as macular anatomy status post vitrectomy repaired tractional detachment on the macula.  Minor ILM changes are noted however central CSME may limit acuity will still need repeat and commence therapy with antivegF to resolve this CSME further  2.  OS follow-up as scheduled  3.  Ophthalmic Meds Ordered this visit:  No orders of the defined types were placed in this encounter.      Return in about 5 weeks (around 06/19/2021) for DILATE OU, AVASTIN OCT, OD.  There are no Patient Instructions on file for this visit.   Explained the diagnoses, plan, and follow up with the patient and they expressed understanding.  Patient expressed understanding of the importance of proper follow up care.   Clent Demark Attilio Zeitler M.D. Diseases & Surgery of the Retina and Vitreous Retina & Diabetic [place] 05/15/21     Abbreviations: M myopia (nearsighted); A astigmatism; H hyperopia (farsighted); P presbyopia; Mrx spectacle prescription;  CTL contact lenses; OD right eye; OS left eye; OU both eyes  XT exotropia; ET esotropia; PEK punctate epithelial keratitis; PEE punctate epithelial erosions; DES dry eye syndrome; MGD meibomian gland dysfunction; ATs artificial tears; PFAT's preservative free artificial tears; [place] nuclear sclerotic cataract; PSC posterior subcapsular cataract; ERM epi-retinal membrane; PVD posterior vitreous detachment; RD retinal detachment; DM diabetes mellitus; DR diabetic retinopathy; NPDR non-proliferative diabetic retinopathy; PDR proliferative diabetic retinopathy; CSME clinically significant macular edema; DME diabetic macular edema; dbh dot blot hemorrhages; CWS cotton wool spot; POAG primary open angle glaucoma; C/D cup-to-disc ratio; HVF humphrey visual field; GVF  goldmann visual field; OCT optical coherence tomography; IOP intraocular pressure; BRVO Branch retinal vein occlusion; CRVO central retinal vein occlusion; CRAO central retinal artery occlusion; BRAO branch retinal artery occlusion; RT retinal tear; SB scleral buckle; PPV pars plana vitrectomy; VH Vitreous hemorrhage; PRP panretinal laser photocoagulation; IVK intravitreal kenalog; VMT vitreomacular traction; MH Macular hole;  NVD neovascularization of the disc; NVE neovascularization elsewhere; AREDS age related eye disease study; ARMD age related macular degeneration; POAG primary open angle glaucoma; EBMD epithelial/anterior basement membrane dystrophy; ACIOL anterior chamber intraocular lens; IOL intraocular lens; PCIOL posterior chamber intraocular lens; Phaco/IOL phacoemulsification with intraocular lens placement; [place] photorefractive keratectomy; LASIK laser assisted in situ keratomileusis; HTN hypertension; DM diabetes mellitus; COPD chronic obstructive pulmonary disease

## 2021-05-16 DIAGNOSIS — D631 Anemia in chronic kidney disease: Secondary | ICD-10-CM | POA: Diagnosis not present

## 2021-05-16 DIAGNOSIS — Z992 Dependence on renal dialysis: Secondary | ICD-10-CM | POA: Diagnosis not present

## 2021-05-16 DIAGNOSIS — N186 End stage renal disease: Secondary | ICD-10-CM | POA: Diagnosis not present

## 2021-05-16 DIAGNOSIS — E876 Hypokalemia: Secondary | ICD-10-CM | POA: Diagnosis not present

## 2021-05-16 DIAGNOSIS — D509 Iron deficiency anemia, unspecified: Secondary | ICD-10-CM | POA: Diagnosis not present

## 2021-05-16 DIAGNOSIS — N2581 Secondary hyperparathyroidism of renal origin: Secondary | ICD-10-CM | POA: Diagnosis not present

## 2021-05-19 DIAGNOSIS — Z992 Dependence on renal dialysis: Secondary | ICD-10-CM | POA: Diagnosis not present

## 2021-05-19 DIAGNOSIS — N186 End stage renal disease: Secondary | ICD-10-CM | POA: Diagnosis not present

## 2021-05-19 DIAGNOSIS — E876 Hypokalemia: Secondary | ICD-10-CM | POA: Diagnosis not present

## 2021-05-19 DIAGNOSIS — D631 Anemia in chronic kidney disease: Secondary | ICD-10-CM | POA: Diagnosis not present

## 2021-05-19 DIAGNOSIS — N2581 Secondary hyperparathyroidism of renal origin: Secondary | ICD-10-CM | POA: Diagnosis not present

## 2021-05-19 DIAGNOSIS — D509 Iron deficiency anemia, unspecified: Secondary | ICD-10-CM | POA: Diagnosis not present

## 2021-05-21 DIAGNOSIS — D631 Anemia in chronic kidney disease: Secondary | ICD-10-CM | POA: Diagnosis not present

## 2021-05-21 DIAGNOSIS — Z992 Dependence on renal dialysis: Secondary | ICD-10-CM | POA: Diagnosis not present

## 2021-05-21 DIAGNOSIS — D509 Iron deficiency anemia, unspecified: Secondary | ICD-10-CM | POA: Diagnosis not present

## 2021-05-21 DIAGNOSIS — N186 End stage renal disease: Secondary | ICD-10-CM | POA: Diagnosis not present

## 2021-05-21 DIAGNOSIS — N2581 Secondary hyperparathyroidism of renal origin: Secondary | ICD-10-CM | POA: Diagnosis not present

## 2021-05-21 DIAGNOSIS — E876 Hypokalemia: Secondary | ICD-10-CM | POA: Diagnosis not present

## 2021-05-22 ENCOUNTER — Ambulatory Visit (INDEPENDENT_AMBULATORY_CARE_PROVIDER_SITE_OTHER): Payer: Medicare Other | Admitting: *Deleted

## 2021-05-22 DIAGNOSIS — J309 Allergic rhinitis, unspecified: Secondary | ICD-10-CM | POA: Diagnosis not present

## 2021-05-23 DIAGNOSIS — E876 Hypokalemia: Secondary | ICD-10-CM | POA: Diagnosis not present

## 2021-05-23 DIAGNOSIS — D631 Anemia in chronic kidney disease: Secondary | ICD-10-CM | POA: Diagnosis not present

## 2021-05-23 DIAGNOSIS — Z992 Dependence on renal dialysis: Secondary | ICD-10-CM | POA: Diagnosis not present

## 2021-05-23 DIAGNOSIS — N2581 Secondary hyperparathyroidism of renal origin: Secondary | ICD-10-CM | POA: Diagnosis not present

## 2021-05-23 DIAGNOSIS — N186 End stage renal disease: Secondary | ICD-10-CM | POA: Diagnosis not present

## 2021-05-23 DIAGNOSIS — D509 Iron deficiency anemia, unspecified: Secondary | ICD-10-CM | POA: Diagnosis not present

## 2021-05-26 DIAGNOSIS — N2581 Secondary hyperparathyroidism of renal origin: Secondary | ICD-10-CM | POA: Diagnosis not present

## 2021-05-26 DIAGNOSIS — Z992 Dependence on renal dialysis: Secondary | ICD-10-CM | POA: Diagnosis not present

## 2021-05-26 DIAGNOSIS — D509 Iron deficiency anemia, unspecified: Secondary | ICD-10-CM | POA: Diagnosis not present

## 2021-05-26 DIAGNOSIS — N186 End stage renal disease: Secondary | ICD-10-CM | POA: Diagnosis not present

## 2021-05-26 DIAGNOSIS — E876 Hypokalemia: Secondary | ICD-10-CM | POA: Diagnosis not present

## 2021-05-26 DIAGNOSIS — D631 Anemia in chronic kidney disease: Secondary | ICD-10-CM | POA: Diagnosis not present

## 2021-05-27 DIAGNOSIS — I159 Secondary hypertension, unspecified: Secondary | ICD-10-CM | POA: Diagnosis not present

## 2021-05-27 DIAGNOSIS — Z992 Dependence on renal dialysis: Secondary | ICD-10-CM | POA: Diagnosis not present

## 2021-05-27 DIAGNOSIS — N186 End stage renal disease: Secondary | ICD-10-CM | POA: Diagnosis not present

## 2021-05-28 DIAGNOSIS — E876 Hypokalemia: Secondary | ICD-10-CM | POA: Diagnosis not present

## 2021-05-28 DIAGNOSIS — N186 End stage renal disease: Secondary | ICD-10-CM | POA: Diagnosis not present

## 2021-05-28 DIAGNOSIS — D631 Anemia in chronic kidney disease: Secondary | ICD-10-CM | POA: Diagnosis not present

## 2021-05-28 DIAGNOSIS — N2581 Secondary hyperparathyroidism of renal origin: Secondary | ICD-10-CM | POA: Diagnosis not present

## 2021-05-28 DIAGNOSIS — Z992 Dependence on renal dialysis: Secondary | ICD-10-CM | POA: Diagnosis not present

## 2021-05-29 ENCOUNTER — Ambulatory Visit (INDEPENDENT_AMBULATORY_CARE_PROVIDER_SITE_OTHER): Payer: Medicare Other | Admitting: *Deleted

## 2021-05-29 DIAGNOSIS — J309 Allergic rhinitis, unspecified: Secondary | ICD-10-CM

## 2021-05-30 DIAGNOSIS — N2581 Secondary hyperparathyroidism of renal origin: Secondary | ICD-10-CM | POA: Diagnosis not present

## 2021-05-30 DIAGNOSIS — Z992 Dependence on renal dialysis: Secondary | ICD-10-CM | POA: Diagnosis not present

## 2021-05-30 DIAGNOSIS — D631 Anemia in chronic kidney disease: Secondary | ICD-10-CM | POA: Diagnosis not present

## 2021-05-30 DIAGNOSIS — N186 End stage renal disease: Secondary | ICD-10-CM | POA: Diagnosis not present

## 2021-05-30 DIAGNOSIS — E876 Hypokalemia: Secondary | ICD-10-CM | POA: Diagnosis not present

## 2021-06-02 DIAGNOSIS — N186 End stage renal disease: Secondary | ICD-10-CM | POA: Diagnosis not present

## 2021-06-02 DIAGNOSIS — Z992 Dependence on renal dialysis: Secondary | ICD-10-CM | POA: Diagnosis not present

## 2021-06-02 DIAGNOSIS — N2581 Secondary hyperparathyroidism of renal origin: Secondary | ICD-10-CM | POA: Diagnosis not present

## 2021-06-02 DIAGNOSIS — E876 Hypokalemia: Secondary | ICD-10-CM | POA: Diagnosis not present

## 2021-06-02 DIAGNOSIS — D631 Anemia in chronic kidney disease: Secondary | ICD-10-CM | POA: Diagnosis not present

## 2021-06-03 DIAGNOSIS — Z992 Dependence on renal dialysis: Secondary | ICD-10-CM | POA: Diagnosis not present

## 2021-06-03 DIAGNOSIS — N186 End stage renal disease: Secondary | ICD-10-CM | POA: Diagnosis not present

## 2021-06-03 DIAGNOSIS — Z452 Encounter for adjustment and management of vascular access device: Secondary | ICD-10-CM | POA: Diagnosis not present

## 2021-06-04 DIAGNOSIS — Z992 Dependence on renal dialysis: Secondary | ICD-10-CM | POA: Diagnosis not present

## 2021-06-04 DIAGNOSIS — N2581 Secondary hyperparathyroidism of renal origin: Secondary | ICD-10-CM | POA: Diagnosis not present

## 2021-06-04 DIAGNOSIS — D631 Anemia in chronic kidney disease: Secondary | ICD-10-CM | POA: Diagnosis not present

## 2021-06-04 DIAGNOSIS — E876 Hypokalemia: Secondary | ICD-10-CM | POA: Diagnosis not present

## 2021-06-04 DIAGNOSIS — N186 End stage renal disease: Secondary | ICD-10-CM | POA: Diagnosis not present

## 2021-06-06 DIAGNOSIS — E876 Hypokalemia: Secondary | ICD-10-CM | POA: Diagnosis not present

## 2021-06-06 DIAGNOSIS — N186 End stage renal disease: Secondary | ICD-10-CM | POA: Diagnosis not present

## 2021-06-06 DIAGNOSIS — Z992 Dependence on renal dialysis: Secondary | ICD-10-CM | POA: Diagnosis not present

## 2021-06-06 DIAGNOSIS — D631 Anemia in chronic kidney disease: Secondary | ICD-10-CM | POA: Diagnosis not present

## 2021-06-06 DIAGNOSIS — N2581 Secondary hyperparathyroidism of renal origin: Secondary | ICD-10-CM | POA: Diagnosis not present

## 2021-06-09 DIAGNOSIS — N2581 Secondary hyperparathyroidism of renal origin: Secondary | ICD-10-CM | POA: Diagnosis not present

## 2021-06-09 DIAGNOSIS — Z992 Dependence on renal dialysis: Secondary | ICD-10-CM | POA: Diagnosis not present

## 2021-06-09 DIAGNOSIS — N186 End stage renal disease: Secondary | ICD-10-CM | POA: Diagnosis not present

## 2021-06-09 DIAGNOSIS — D631 Anemia in chronic kidney disease: Secondary | ICD-10-CM | POA: Diagnosis not present

## 2021-06-09 DIAGNOSIS — E876 Hypokalemia: Secondary | ICD-10-CM | POA: Diagnosis not present

## 2021-06-11 DIAGNOSIS — N2581 Secondary hyperparathyroidism of renal origin: Secondary | ICD-10-CM | POA: Diagnosis not present

## 2021-06-11 DIAGNOSIS — D631 Anemia in chronic kidney disease: Secondary | ICD-10-CM | POA: Diagnosis not present

## 2021-06-11 DIAGNOSIS — Z992 Dependence on renal dialysis: Secondary | ICD-10-CM | POA: Diagnosis not present

## 2021-06-11 DIAGNOSIS — N186 End stage renal disease: Secondary | ICD-10-CM | POA: Diagnosis not present

## 2021-06-11 DIAGNOSIS — E876 Hypokalemia: Secondary | ICD-10-CM | POA: Diagnosis not present

## 2021-06-12 ENCOUNTER — Ambulatory Visit (INDEPENDENT_AMBULATORY_CARE_PROVIDER_SITE_OTHER): Payer: Medicare Other | Admitting: *Deleted

## 2021-06-12 DIAGNOSIS — J309 Allergic rhinitis, unspecified: Secondary | ICD-10-CM | POA: Diagnosis not present

## 2021-06-13 DIAGNOSIS — Z992 Dependence on renal dialysis: Secondary | ICD-10-CM | POA: Diagnosis not present

## 2021-06-13 DIAGNOSIS — E876 Hypokalemia: Secondary | ICD-10-CM | POA: Diagnosis not present

## 2021-06-13 DIAGNOSIS — N186 End stage renal disease: Secondary | ICD-10-CM | POA: Diagnosis not present

## 2021-06-13 DIAGNOSIS — D631 Anemia in chronic kidney disease: Secondary | ICD-10-CM | POA: Diagnosis not present

## 2021-06-13 DIAGNOSIS — N2581 Secondary hyperparathyroidism of renal origin: Secondary | ICD-10-CM | POA: Diagnosis not present

## 2021-06-16 DIAGNOSIS — N2581 Secondary hyperparathyroidism of renal origin: Secondary | ICD-10-CM | POA: Diagnosis not present

## 2021-06-16 DIAGNOSIS — E876 Hypokalemia: Secondary | ICD-10-CM | POA: Diagnosis not present

## 2021-06-16 DIAGNOSIS — D631 Anemia in chronic kidney disease: Secondary | ICD-10-CM | POA: Diagnosis not present

## 2021-06-16 DIAGNOSIS — N186 End stage renal disease: Secondary | ICD-10-CM | POA: Diagnosis not present

## 2021-06-16 DIAGNOSIS — Z992 Dependence on renal dialysis: Secondary | ICD-10-CM | POA: Diagnosis not present

## 2021-06-18 DIAGNOSIS — D631 Anemia in chronic kidney disease: Secondary | ICD-10-CM | POA: Diagnosis not present

## 2021-06-18 DIAGNOSIS — N2581 Secondary hyperparathyroidism of renal origin: Secondary | ICD-10-CM | POA: Diagnosis not present

## 2021-06-18 DIAGNOSIS — Z992 Dependence on renal dialysis: Secondary | ICD-10-CM | POA: Diagnosis not present

## 2021-06-18 DIAGNOSIS — N186 End stage renal disease: Secondary | ICD-10-CM | POA: Diagnosis not present

## 2021-06-18 DIAGNOSIS — E876 Hypokalemia: Secondary | ICD-10-CM | POA: Diagnosis not present

## 2021-06-21 DIAGNOSIS — Z992 Dependence on renal dialysis: Secondary | ICD-10-CM | POA: Diagnosis not present

## 2021-06-21 DIAGNOSIS — N186 End stage renal disease: Secondary | ICD-10-CM | POA: Diagnosis not present

## 2021-06-21 DIAGNOSIS — N2581 Secondary hyperparathyroidism of renal origin: Secondary | ICD-10-CM | POA: Diagnosis not present

## 2021-06-21 DIAGNOSIS — E876 Hypokalemia: Secondary | ICD-10-CM | POA: Diagnosis not present

## 2021-06-21 DIAGNOSIS — D631 Anemia in chronic kidney disease: Secondary | ICD-10-CM | POA: Diagnosis not present

## 2021-06-23 DIAGNOSIS — N186 End stage renal disease: Secondary | ICD-10-CM | POA: Diagnosis not present

## 2021-06-23 DIAGNOSIS — D631 Anemia in chronic kidney disease: Secondary | ICD-10-CM | POA: Diagnosis not present

## 2021-06-23 DIAGNOSIS — E876 Hypokalemia: Secondary | ICD-10-CM | POA: Diagnosis not present

## 2021-06-23 DIAGNOSIS — Z992 Dependence on renal dialysis: Secondary | ICD-10-CM | POA: Diagnosis not present

## 2021-06-23 DIAGNOSIS — N2581 Secondary hyperparathyroidism of renal origin: Secondary | ICD-10-CM | POA: Diagnosis not present

## 2021-06-24 ENCOUNTER — Ambulatory Visit (INDEPENDENT_AMBULATORY_CARE_PROVIDER_SITE_OTHER): Payer: Medicare Other | Admitting: Allergy & Immunology

## 2021-06-24 ENCOUNTER — Other Ambulatory Visit: Payer: Self-pay

## 2021-06-24 ENCOUNTER — Encounter: Payer: Self-pay | Admitting: Allergy & Immunology

## 2021-06-24 VITALS — BP 162/80 | HR 85 | Resp 19

## 2021-06-24 DIAGNOSIS — J4540 Moderate persistent asthma, uncomplicated: Secondary | ICD-10-CM | POA: Diagnosis not present

## 2021-06-24 DIAGNOSIS — J309 Allergic rhinitis, unspecified: Secondary | ICD-10-CM | POA: Diagnosis not present

## 2021-06-24 DIAGNOSIS — J301 Allergic rhinitis due to pollen: Secondary | ICD-10-CM

## 2021-06-24 MED ORDER — MONTELUKAST SODIUM 10 MG PO TABS: ORAL_TABLET | ORAL | 5 refills | Status: DC

## 2021-06-24 NOTE — Patient Instructions (Addendum)
1. Moderate persistent asthma, uncomplicated - Lung testing looked stable. - Start using Breztri two puffs twice daily (samples provided). - This will allow [place] to see whether regular use of an asthma medication will help with the cough.  - Refills on montelukast sent in today. - We also enrolled you in [facility] (our nursing staff will call to check on that).  - Daily controller medication(s): Singulair 10mg  daily only + Symbicort two puffs two puffs twice daily - Prior to physical activity: albuterol 2 puffs 10-15 minutes before physical activity. - Rescue medications: albuterol 4 puffs every 4-6 hours as needed - Asthma control goals:  * Full participation in all desired activities (may need albuterol before activity) * Albuterol use two time or less a week on average (not counting use with activity) * Cough interfering with sleep two time or less a month * Oral steroids no more than once a year * No hospitalizations  2. Chronic rhinitis (dust mites, cat, dog, grass, and indoor and outdoor molds) - Contonue with allergy shots at the same schedule.  - Continue with Xyzal 5mg  daily. - Continue with Singulair 10mg  daily. - Continue with Flonase one spray per nostril twice daily and Astelin one spray per nostril twice daily.   3. Return in about 6 months (around 12/22/2021).    Please inform us of any Emergency Department visits, hospitalizations, or changes in symptoms. Call us before going to the ED for breathing or allergy symptoms since we might be able to fit you in for a sick visit. Feel free to contact us anytime with any questions, problems, or concerns.  It was a pleasure to see you again today!  Websites that have reliable patient information: 1. American Academy of Asthma, Allergy, and Immunology: www.aaaai.org 2. Food Allergy Research and Education (FARE): foodallergy.org 3. Mothers of Asthmatics: http://www.asthmacommunitynetwork.org 4. American College of Allergy, Asthma,  and Immunology: www.acaai.org   COVID-19 Vaccine Information can be found at: ShippingScam.co.uk For questions related to vaccine distribution or appointments, please email [email or web address] or call [phone].     "Like" [place] on Facebook and Instagram for our latest updates!       Make sure you are registered to vote! If you have moved or changed any of your contact information, you will need to get this updated before voting!  In some cases, you MAY be able to register to vote online: [email or web address]

## 2021-06-24 NOTE — Progress Notes (Signed)
FOLLOW UP  Date of Service/Encounter:  06/24/21   Assessment:   Moderate persistent asthma, uncomplicated - non compliance due to medication cost   Chronic rhinitis (dust mites, cat, dog, grass, and indoor and outdoor molds) - on allergen immunotherapy with maintenance reached in November 2021   Elevated eosinophils - likely uncontrolled allergies   Complicated past medical history, including kidney disease on dialysis    Plan/Recommendations:    1. Moderate persistent asthma, uncomplicated - Lung testing looked stable. - Start using Breztri two puffs twice daily (samples provided). - This will allow [place] to see whether regular use of an asthma medication will help with the cough.  - Refills on montelukast sent in today. - We also enrolled you in [place] [facility] (our nursing staff will call to check on that).  - Daily controller medication(s): Singulair 10mg  daily only + Symbicort two puffs two puffs twice daily - Prior to physical activity: albuterol 2 puffs 10-15 minutes before physical activity. - Rescue medications: albuterol 4 puffs every 4-6 hours as needed - Asthma control goals:  * Full participation in all desired activities (may need albuterol before activity) * Albuterol use two time or less a week on average (not counting use with activity) * Cough interfering with sleep two time or less a month * Oral steroids no more than once a year * No hospitalizations  2. Chronic rhinitis (dust mites, cat, dog, grass, and indoor and outdoor molds) - Contonue with allergy shots at the same schedule.  - Continue with Xyzal 5mg  daily. - Continue with Singulair 10mg  daily. - Continue with Flonase one spray per nostril twice daily and Astelin one spray per nostril twice daily.   3. Return in about 6 months (around 12/22/2021).    Subjective:   Destiny Day is a 70 y.o. female presenting today for follow up of  Chief Complaint  Patient presents with   Asthma     Destiny Day has a history of the following: Patient Active Problem List   Diagnosis Date Noted   Acute on chronic heart failure ([place]) 01/05/2021   Macular pucker, right eye 10/29/2020   Nausea with vomiting, unspecified 09/10/2020   Vitreomacular traction syndrome, 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   Pseudophakia of left eye 07/02/2020   Diabetic macular edema of right eye with proliferative retinopathy associated with type 2 diabetes mellitus ([place]) 07/02/2020   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 type 2 diabetes mellitus ([place]) 04/23/2020   Nuclear sclerotic cataract of right eye 04/23/2020   Allergy, unspecified, initial encounter 04/04/2020   Anaphylactic shock, unspecified, initial encounter 04/04/2020   Secondary hypertension, unspecified 12/13/2019   Other disorders of phosphorus metabolism 08/24/2019   Paroxysmal atrial fibrillation ([place]) 06/13/2019   Hypocalcemia 04/17/2019   Old bucket handle tear of lateral meniscus of right knee    Old peripheral tear of medial meniscus of right knee    Staphylococcal arthritis, unspecified joint ([place]) 05/11/2018   Septic arthritis of knee, right ([place]) 05/06/2018   Degenerative tear of posterior horn of medial meniscus, right    Old complex tear of lateral meniscus of right knee    Loose body in knee, right    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[place]) 02/03/2018   End-stage renal disease on hemodialysis ([place])    Respiratory failure ([place]) 01/21/2018   Achilles tendon contracture, right 11/09/2017   Labile blood glucose    Anemia of infection and chronic disease  Black stool    Right sided weakness    Subdural hemorrhage following injury 08/30/2017   Diabetic peripheral neuropathy (HCC)    Chronic obstructive pulmonary disease (HCC)    Subdural hematoma 08/24/2017   Traumatic subdural hematoma 08/23/2017   Fall    SDH (subdural hematoma)    Acute respiratory failure with  hypoxia (HCC)    Diabetes mellitus type 2 in nonobese (HCC)    Leukocytosis    Labile blood pressure    Generalized anxiety disorder    Debility 08/16/2017   Amputated great toe of right foot (HCC)    Amputated toe of left foot (HCC)    Post-operative pain    ESRD on dialysis ([place])    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)    Anxiety state    Acute blood loss anemia    Diarrhea, unspecified 01/16/2017   Dependence on renal dialysis ([place]) 01/06/2017   Idiopathic chronic venous hypertension of both lower extremities with inflammation 10/13/2016   S/P transmetatarsal amputation of foot, left ([place]) 09/21/2016   Onychomycosis 08/15/2016   Unspecified protein-calorie malnutrition ([place]) 07/29/2016   Other mechanical complication of femoral arterial graft (bypass), initial encounter ([place]) 07/14/2016   Other pneumonia, unspecified organism 07/11/2016   Age-related osteoporosis without current pathological fracture 07/11/2016   Cervical disc disorder, unspecified, unspecified cervical region 07/11/2016   Diverticulitis of intestine, part unspecified, without perforation or abscess without bleeding 07/11/2016   Unspecified abdominal hernia without obstruction or gangrene 07/11/2016   Gastro-esophageal reflux disease with esophagitis 07/11/2016   Primary generalized (osteo)arthritis 07/11/2016   Peripheral vascular disease ([place]) 07/08/2016   Cellulitis of left toe 06/29/2016   Atherosclerosis of native artery of left lower extremity with gangrene ([place]) 22/08/5425   Complication of vascular dialysis catheter 02/29/2016   Encounter for screening for other viral diseases 01/10/2016   Encounter for removal of sutures 10/29/2015   Other fluid overload 10/23/2015   Anemia in chronic kidney disease 10/16/2015   Essential (primary) hypertension 10/16/2015   Acute pulmonary edema ([place]) 10/16/2015   Coagulation defect,  unspecified ([place]) 10/16/2015   Acute respiratory failure ([place]) 10/16/2015   Hyperlipidemia, unspecified 10/16/2015   Iron deficiency anemia, unspecified 10/16/2015   Pain, unspecified 10/16/2015   Pruritus, unspecified 10/16/2015   Secondary hyperparathyroidism of renal origin ([place]) 10/16/2015   Shortness of breath 10/16/2015   GERD (gastroesophageal reflux disease) 10/07/2015   ARF (acute renal failure) ([place])    Hypothermia 06/12/2015   End stage renal disease ([place]) 06/12/2015   Unspecified diastolic (congestive) heart failure ([place]) 07/30/2014   CKD (chronic kidney disease) stage 4, GFR 15-29 ml/min (HCC) 06/27/2014   Hypertensive heart disease 05/25/2013   CKD (chronic kidney disease) stage 3, GFR 30-59 ml/min (HCC) 05/25/2013   Pulmonary edema 05/23/2013   Type 2 diabetes mellitus with diabetic chronic kidney disease ([place]) 05/23/2013   Type 2 diabetes mellitus with hyperosmolar nonketotic hyperglycemia ([place]) 04/13/2013   Acute on chronic diastolic heart failure ([place]) 04/13/2013   Gastrointestinal hemorrhage, unspecified 03/31/2013   Hypokalemia 08/24/2012   Hyperlipidemia associated with type 2 diabetes mellitus ([place]) 12/27/2007   Allergic rhinitis due to pollen 12/27/2007   Unspecified asthma, uncomplicated 01/17/7627    History obtained from: chart review and patient.  Amari is a 70 y.o. female presenting for a follow up visit.  She was last seen in May 2022.  At that time, her lung testing looks stable.  We changed her to Advair and  lieu of Symbicort since I felt this might be covered better based on the tier labeling in the EMR.  We continue with Singulair 10 mg daily and albuterol as needed.  For her rhinitis, we continued with allergy shots as well as Xyzal, Singulair, Flonase, and Astelin.  Since last visit, she has mostly done well.   She recently had surgery and could not talk or sing for over one week. She had a hard time with this. This was eye surgery. She has  blood behind her retina with scar tissue. Her vision has improved but they are trying to keep everything stable. She has had cataracts and floaters over the last few years. Dr. Katy Fitch did the cataract surgery and Dr. Zadie Rhine did the retinal surgery. She had blood issues with both retinas. She has a follow up on December 6th.   Asthma/Respiratory Symptom History: She reports a cough that is constant. She could not afford the Advair. She uses the Symbicort only on a PRN basis.  She never uses this routinely. She has Medicare and a supplemental plan. She has [employer] for her prescriptions. She does not take anything routinely on a daily basis at all because of the cost of her prescriptions.  Allergic Rhinitis Symptom History: Allergic rhinitis is well controlled with her current regimen of medications. She has not needed antibiotics at all She does feel that her allergy shots are going well. She feels better on them compared to being off of them.  Destiny Day is on allergen immunotherapy. She receives two injections. Immunotherapy script #1 contains grasses, cat and dog. She currently receives 0.16mL of the RED vial (1/100). Immunotherapy script #2 contains molds and dust mites. She currently receives 0.56mL of the RED vial (1/100). She started shots July of 2021 and reached maintenance in November 2021.   She was interested in [place] [facility] during the visit to get free drug. I filled out the online form and got this in reply:   Your patient is enrolled. Your patient is eligible and has been enrolled in the [place] Prescription [employer] Program. You can track their enrollment status within your profile.  Start of Enrollment period: 06/24/2021  End of Enrollment period: 07/26/2022  For more information on what to expect after a patient is enrolled, please read What to Expect.  Otherwise, there have been no changes to her past medical history, surgical history, family history, or social history.    Review of  Systems  Constitutional: Negative.  Negative for fever, malaise/fatigue and weight loss.  HENT: Negative.  Negative for congestion, ear discharge and ear pain.   Eyes:  Negative for pain, discharge and redness.  Respiratory:  Positive for cough. Negative for sputum production, shortness of breath and wheezing.   Cardiovascular: Negative.  Negative for chest pain and palpitations.  Gastrointestinal:  Negative for abdominal pain, heartburn, nausea and vomiting.  Skin: Negative.  Negative for itching and rash.  Neurological:  Negative for dizziness and headaches.  Endo/Heme/Allergies:  Negative for environmental allergies. Does not bruise/bleed easily.      Objective:   Blood pressure (!) 162/80, pulse 85, resp. rate 19, SpO2 97 %. There is no height or weight on file to calculate BMI.   Physical Exam:  Physical Exam Vitals reviewed.  Constitutional:      Appearance: She is well-developed.     Comments: Very pleasant. Sang around 4 songs for me during the visit.   HENT:     Head: Normocephalic and atraumatic.  Right Ear: Tympanic membrane, ear canal and external ear normal.     Left Ear: Tympanic membrane, ear canal and external ear normal.     Nose: No nasal deformity, septal deviation, mucosal edema or rhinorrhea.     Right Turbinates: Enlarged, swollen and pale.     Left Turbinates: Enlarged, swollen and pale.     Right Sinus: No maxillary sinus tenderness or frontal sinus tenderness.     Left Sinus: No maxillary sinus tenderness or frontal sinus tenderness.     Mouth/Throat:     Lips: Pink.     Mouth: Mucous membranes are not pale and not dry.     Pharynx: Uvula midline.  Eyes:     General: Lids are normal. No allergic shiner.       Right eye: No discharge.        Left eye: No discharge.     Conjunctiva/sclera: Conjunctivae normal.     Right eye: Right conjunctiva is not injected. No chemosis.    Left eye: Left conjunctiva is not injected. No chemosis.    Pupils:  Pupils are equal, round, and reactive to light.  Cardiovascular:     Rate and Rhythm: Normal rate and regular rhythm.     Heart sounds: Normal heart sounds.  Pulmonary:     Effort: Pulmonary effort is normal. No tachypnea, accessory muscle usage or respiratory distress.     Breath sounds: Normal breath sounds. No wheezing, rhonchi or rales.     Comments: Moving air well in all lung fields.  No increased work of breathing. Chest:     Chest wall: No tenderness.  Lymphadenopathy:     Cervical: No cervical adenopathy.  Skin:    Coloration: Skin is not pale.     Findings: No abrasion, erythema, petechiae or rash. Rash is not papular, urticarial or vesicular.  Neurological:     Mental Status: She is alert.  Psychiatric:        Behavior: Behavior is cooperative.     Diagnostic studies:   Spirometry: results abnormal (FEV1: 0.92/46%, FVC: 1.25/48%, FEV1/FVC: 74%).    Spirometry consistent with possible restrictive disease.    Allergy Studies: none      Salvatore Marvel, MD  [facility] [place]

## 2021-06-25 DIAGNOSIS — N186 End stage renal disease: Secondary | ICD-10-CM | POA: Diagnosis not present

## 2021-06-25 DIAGNOSIS — E876 Hypokalemia: Secondary | ICD-10-CM | POA: Diagnosis not present

## 2021-06-25 DIAGNOSIS — Z992 Dependence on renal dialysis: Secondary | ICD-10-CM | POA: Diagnosis not present

## 2021-06-25 DIAGNOSIS — N2581 Secondary hyperparathyroidism of renal origin: Secondary | ICD-10-CM | POA: Diagnosis not present

## 2021-06-25 DIAGNOSIS — D631 Anemia in chronic kidney disease: Secondary | ICD-10-CM | POA: Diagnosis not present

## 2021-06-26 ENCOUNTER — Encounter (INDEPENDENT_AMBULATORY_CARE_PROVIDER_SITE_OTHER): Payer: Medicare Other | Admitting: Ophthalmology

## 2021-06-26 DIAGNOSIS — N186 End stage renal disease: Secondary | ICD-10-CM | POA: Diagnosis not present

## 2021-06-26 DIAGNOSIS — I159 Secondary hypertension, unspecified: Secondary | ICD-10-CM | POA: Diagnosis not present

## 2021-06-26 DIAGNOSIS — Z992 Dependence on renal dialysis: Secondary | ICD-10-CM | POA: Diagnosis not present

## 2021-06-27 DIAGNOSIS — N186 End stage renal disease: Secondary | ICD-10-CM | POA: Diagnosis not present

## 2021-06-27 DIAGNOSIS — D631 Anemia in chronic kidney disease: Secondary | ICD-10-CM | POA: Diagnosis not present

## 2021-06-27 DIAGNOSIS — N2581 Secondary hyperparathyroidism of renal origin: Secondary | ICD-10-CM | POA: Diagnosis not present

## 2021-06-27 DIAGNOSIS — E876 Hypokalemia: Secondary | ICD-10-CM | POA: Diagnosis not present

## 2021-06-27 DIAGNOSIS — Z992 Dependence on renal dialysis: Secondary | ICD-10-CM | POA: Diagnosis not present

## 2021-06-30 DIAGNOSIS — D631 Anemia in chronic kidney disease: Secondary | ICD-10-CM | POA: Diagnosis not present

## 2021-06-30 DIAGNOSIS — E876 Hypokalemia: Secondary | ICD-10-CM | POA: Diagnosis not present

## 2021-06-30 DIAGNOSIS — N186 End stage renal disease: Secondary | ICD-10-CM | POA: Diagnosis not present

## 2021-06-30 DIAGNOSIS — N2581 Secondary hyperparathyroidism of renal origin: Secondary | ICD-10-CM | POA: Diagnosis not present

## 2021-06-30 DIAGNOSIS — Z992 Dependence on renal dialysis: Secondary | ICD-10-CM | POA: Diagnosis not present

## 2021-07-01 ENCOUNTER — Encounter (INDEPENDENT_AMBULATORY_CARE_PROVIDER_SITE_OTHER): Payer: Self-pay | Admitting: Ophthalmology

## 2021-07-01 ENCOUNTER — Ambulatory Visit (INDEPENDENT_AMBULATORY_CARE_PROVIDER_SITE_OTHER): Payer: Medicare Other | Admitting: Ophthalmology

## 2021-07-01 ENCOUNTER — Other Ambulatory Visit: Payer: Self-pay

## 2021-07-01 DIAGNOSIS — H35371 Puckering of macula, right eye: Secondary | ICD-10-CM | POA: Diagnosis not present

## 2021-07-01 DIAGNOSIS — E113512 Type 2 diabetes mellitus with proliferative diabetic retinopathy with macular edema, left eye: Secondary | ICD-10-CM

## 2021-07-01 DIAGNOSIS — E113521 Type 2 diabetes mellitus with proliferative diabetic retinopathy with traction retinal detachment involving the macula, right eye: Secondary | ICD-10-CM

## 2021-07-01 DIAGNOSIS — E113511 Type 2 diabetes mellitus with proliferative diabetic retinopathy with macular edema, right eye: Secondary | ICD-10-CM | POA: Diagnosis not present

## 2021-07-01 MED ORDER — BEVACIZUMAB 2.5 MG/0.1ML IZ SOSY
2.5000 mg | PREFILLED_SYRINGE | INTRAVITREAL | Status: AC | PRN
  Administered 2021-07-01: 2.5 mg via INTRAVITREAL

## 2021-07-01 NOTE — Progress Notes (Signed)
07/01/2021     CHIEF COMPLAINT Patient presents for  Chief Complaint  Patient presents with   Retina Evaluation      HISTORY OF PRESENT ILLNESS: Destiny Day is a 70 y.o. female who presents to the clinic today for:   HPI   Follow-up for history of proliferative diabetic retinopathy Last edited by Hurman Horn, MD on 07/01/2021  9:33 AM.      Referring physician: Cipriano Mile, NP [place],  [place] [id number]  HISTORICAL INFORMATION:   Selected notes from the [facility]    Lab Results  Component Value Date   HGBA1C 5.6 06/26/2019     CURRENT MEDICATIONS: No current outpatient medications on file. (Ophthalmic Drugs)   No current facility-administered medications for this visit. (Ophthalmic Drugs)   Current Outpatient Medications (Other)  Medication Sig   albuterol (PROAIR HFA) 108 (90 Base) MCG/ACT inhaler Inhale 2 puffs into the lungs every 4 (four) hours as needed for wheezing or shortness of breath.   amLODipine (NORVASC) 10 MG tablet Take 10 mg by mouth at bedtime.   aspirin EC 81 MG tablet Take 81 mg by mouth daily.   atorvastatin (LIPITOR) 40 MG tablet Take 1 tablet (40 mg total) by mouth daily.   busPIRone (BUSPAR) 7.5 MG tablet Take 7.5 mg by mouth 2 (two) times daily.   carvedilol (COREG) 12.5 MG tablet Take 12.5 mg by mouth See admin instructions. Take 1 tablet (12.5 mg) by mouth once daily in the evening on Mondays, Wednesdays, & Fridays. (Dialysis) Take 1 tablet (12.5 mg) by mouth twice daily on Sundays, Tuesdays, Thursdays, & Saturdays. (Non Dialysis)   cinacalcet (SENSIPAR) 60 MG tablet Take 90 mg by mouth See admin instructions. Take 1 tablet (90 mg) by mouth in the evening on Mondays, Wednesdays, & Fridays (dialysis) Take 1 tablet (90 mg) by mouth twice daily on Sundays, Tuesdays, Thursdays, Saturdays. (non dialysis)   docusate sodium (COLACE) 100 MG capsule Take 1 capsule (100 mg total) by mouth 2 (two) times daily.    EPINEPHrine (AUVI-Q) 0.3 mg/0.3 mL IJ SOAJ injection Inject 0.3 mg into the muscle as needed for anaphylaxis.   ethyl chloride spray SMARTSIG:1 Spray(s) Topical 3 Times a Week   famotidine (PEPCID) 10 MG tablet Take 1 tablet (10 mg total) by mouth every other day. (Patient not taking: Reported on 06/24/2021)   ferric citrate (AURYXIA) 1 GM 210 MG(Fe) tablet Take 210-420 mg by mouth See admin instructions. Take 2 tablets (420 mg) by mouth with each meal & take 1 tablet (210 mg) by mouth with snacks.   fluticasone (FLONASE) 50 MCG/ACT nasal spray Place 2 sprays into both nostrils daily as needed for allergies or rhinitis.   fluticasone-salmeterol (ADVAIR HFA) 115-21 MCG/ACT inhaler Inhale two puffs twice daily   gabapentin (NEURONTIN) 100 MG capsule TAKE 1 CAPSULE (100 MG TOTAL) BY MOUTH AT BEDTIME. WHEN NECESSARY FOR NEUROPATHY PAIN (Patient taking differently: Take 100 mg by mouth at bedtime as needed (pain). When necessary for neuropathy pain)   glipiZIDE (GLUCOTROL) 5 MG tablet Take 5 mg by mouth daily before breakfast.   ipratropium (ATROVENT) 0.06 % nasal spray Place 2 sprays into both nostrils 4 (four) times daily.   Isosorb Dinitrate-hydrALAZINE (BIDIL PO) Take by mouth.   isosorbide-hydrALAZINE (BIDIL) 20-37.5 MG tablet Take 1 tablet by mouth 3 (three) times daily. BID on days of dialysis   lidocaine-prilocaine (EMLA) cream Apply 1 application topically 3 (three) times a week. 1-2 hours  prior to dialysis   montelukast (SINGULAIR) 10 MG tablet TAKE 1 TABLET BY MOUTH EVERYDAY AT BEDTIME   multivitamin (RENA-VIT) TABS tablet Take 1 tablet by mouth daily.    omeprazole (PRILOSEC) 20 MG capsule Take 20 mg by mouth daily.    ondansetron (ZOFRAN) 4 MG tablet Take 1 tablet (4 mg total) by mouth every 6 (six) hours as needed for nausea.   polyethylene glycol (MIRALAX / GLYCOLAX) 17 g packet Take 17 g by mouth daily.   Spacer/Aero-Holding Chambers (AEROCHAMBER PLUS) inhaler Use with inhaler    traZODone (DESYREL) 50 MG tablet Take 50 mg by mouth at bedtime as needed for sleep.   Vitamin D, Ergocalciferol, (DRISDOL) 1.25 MG (50000 UT) CAPS capsule Take 50,000 Units by mouth every Sunday. Takes on Sunday   No current facility-administered medications for this visit. (Other)      REVIEW OF SYSTEMS:    ALLERGIES Allergies  Allergen Reactions   Adhesive  [Tape] Hives   Lisinopril Cough   Prednisone Swelling and Other (See Comments)    Excessive fluid buildup    PAST MEDICAL HISTORY Past Medical History:  Diagnosis Date   Abdominal bruit    Anxiety    Arthritis    Osteoarthritis   Asthma    Cervical disc disease    "pinced nerve"   CHF (congestive heart failure) (HCC)    Diabetes mellitus    Type II   Diverticulitis    ESRD (end stage renal disease) ([place])    dialysis - M/W/F- [facility]   GERD (gastroesophageal reflux disease)    from medications   GI bleed 03/31/2013   Head injury 07/2017   History of hiatal hernia    Hyperlipidemia    Hypertension    Neuropathy    left leg   Nuclear sclerotic cataract of right eye 04/23/2020   Osteoporosis    Peripheral vascular disease ([place])    Pneumonia    "very young" and a few years ago   Seasonal allergies    Shortness of breath dyspnea    WIth exertion, when fluid builds   Sleep apnea    can't afford cpap    Past Surgical History:  Procedure Laterality Date   A/V SHUNTOGRAM N/A 09/22/2016   Procedure: A/V Shuntogram - left arm;  Surgeon: Serafina Mitchell, MD;  Location: [facility];  Service: Cardiovascular;  Laterality: N/A;   A/V SHUNTOGRAM N/A 03/22/2018   Procedure: A/V SHUNTOGRAM - left arm;  Surgeon: Serafina Mitchell, MD;  Location: [facility];  Service: Cardiovascular;  Laterality: N/A;   A/V SHUNTOGRAM Left 11/17/2018   Procedure: A/V SHUNTOGRAM;  Surgeon: Angelia Mould, MD;  Location: [facility];  Service: Cardiovascular;  Laterality: Left;   ABDOMINAL HYSTERECTOMY  1993`    AMPUTATION Left 09/01/2016   Procedure: LEFT FOOT TRANSMETATARSAL AMPUTATION;  Surgeon: Newt Minion, MD;  Location: [facility];  Service: Orthopedics;  Laterality: Left;   AMPUTATION Right 08/11/2017   Procedure: RIGHT GREAT TOE AMPUTATION DIGIT;  Surgeon: Rosetta Posner, MD;  Location: [facility];  Service: Vascular;  Laterality: Right;   AMPUTATION Right 12/31/2017   Procedure: RIGHT TRANSMETATARSAL AMPUTATION;  Surgeon: Newt Minion, MD;  Location: [facility];  Service: Orthopedics;  Laterality: Right;   AV FISTULA PLACEMENT Left 04/21/2016   Procedure: INSERTION OF ARTERIOVENOUS (AV) GORE-TEX GRAFT ARM LEFT;  Surgeon: Elam Dutch, MD;  Location: [facility];  Service: Vascular;  Laterality: Left;   BASCILIC  VEIN TRANSPOSITION Left 07/10/2014   Procedure: BASCILIC VEIN TRANSPOSITION;  Surgeon: Angelia Mould, MD;  Location: [facility];  Service: Vascular;  Laterality: Left;   [place] Right 11/08/2014   Procedure: FIRST STAGE BASILIC VEIN TRANSPOSITION;  Surgeon: Angelia Mould, MD;  Location: [facility];  Service: Vascular;  Laterality: Right;   [place] Right 01/18/2015   Procedure: SECOND STAGE BASILIC VEIN TRANSPOSITION;  Surgeon: Angelia Mould, MD;  Location: [facility];  Service: Vascular;  Laterality: Right;   CRANIOTOMY N/A 08/23/2017   Procedure: CRANIOTOMY HEMATOMA EVACUATION SUBDURAL;  Surgeon: Ashok Pall, MD;  Location: [facility];  Service: Neurosurgery;  Laterality: N/A;   CRANIOTOMY Left 08/24/2017   Procedure: CRANIOTOMY FOR RECURRENT ACUTE SUBDURAL HEMATOMA;  Surgeon: Ashok Pall, MD;  Location: [facility];  Service: Neurosurgery;  Laterality: Left;   ESOPHAGOGASTRODUODENOSCOPY N/A 03/31/2013   Procedure: ESOPHAGOGASTRODUODENOSCOPY (EGD);  Surgeon: Gatha Mayer, MD;  Location: [facility] ENDOSCOPY;  Service: Endoscopy;  Laterality: N/A;   EYE SURGERY     laser surgery   FEMORAL-POPLITEAL BYPASS GRAFT Left 07/08/2016   Procedure: LEFT  FEMORAL-BELOW KNEE POPLITEAL  ARTERY BYPASS GRAFT USING 6MM X 80 CM PROPATEN GORETEX GRAFT WITH RINGS.;  Surgeon: Rosetta Posner, MD;  Location: [facility] OR;  Service: Vascular;  Laterality: Left;   FEMORAL-POPLITEAL BYPASS GRAFT Right 08/11/2017   Procedure: RIGHT FEMORAL TO BELOW KNEE POPLITEAL ARTERKY  BYPASS GRAFT USING 6MM RINGED PROPATEN GRAFT;  Surgeon: Rosetta Posner, MD;  Location: [facility];  Service: Vascular;  Laterality: Right;   FISTULOGRAM Left 10/29/2014   Procedure: FISTULOGRAM;  Surgeon: Angelia Mould, MD;  [facility] CATH LAB;  Service: Cardiovascular;  Laterality: Left;   GAS/FLUID EXCHANGE Left 12/20/2018   Procedure: AIR GAS EXCHANGE;  Surgeon: Jalene Mullet, MD;  Location: [facility];  Service: Ophthalmology;  Laterality: Left;   INSERTION OF DIALYSIS CATHETER N/A 09/05/2020   Procedure: INSERTION OF DIALYSIS CATHETER;  Surgeon: Cherre Robins, MD;  Location: [place];  Service: Vascular;  Laterality: N/A;   INSERTION OF DIALYSIS CATHETER Left 03/13/2021   Procedure: INSERTION OF DIALYSIS CATHETER;  Surgeon: Waynetta Sandy, MD;  Location: [place];  Service: Vascular;  Laterality: Left;   KNEE ARTHROSCOPY Right 05/06/2018   Procedure: RIGHT KNEE ARTHROSCOPY;  Surgeon: Newt Minion, MD;  Location: [facility];  Service: Orthopedics;  Laterality: Right;   KNEE ARTHROSCOPY Right 06/10/2018   Procedure: RIGHT KNEE ARTHROSCOPY AND DEBRIDEMENT;  Surgeon: Newt Minion, MD;  Location: [facility] [place];  Service: Orthopedics;  Laterality: Right;   LIGATION OF ARTERIOVENOUS  FISTULA Left 04/21/2016   Procedure: LIGATION OF ARTERIOVENOUS  FISTULA LEFT ARM;  Surgeon: Elam Dutch, MD;  Location: [facility];  Service: Vascular;  Laterality: Left;   LOWER EXTREMITY ANGIOGRAPHY N/A 04/06/2017   Procedure: Lower Extremity Angiography - Right;  Surgeon: Serafina Mitchell, MD;  Location: [facility];  Service: Cardiovascular;  Laterality: N/A;   MEMBRANE PEEL Left 12/20/2018   Procedure: MEMBRANE PEEL;  Surgeon: Jalene Mullet, MD;   Location: [place];  Service: Ophthalmology;  Laterality: Left;   PATCH ANGIOPLASTY Right 01/18/2015   Procedure: BASILIC VEIN PATCH ANGIOPLASTY USING VASCUGUARD PATCH;  Surgeon: Angelia Mould, MD;  Location: [facility];  Service: Vascular;  Laterality: Right;   PERIPHERAL VASCULAR BALLOON ANGIOPLASTY  09/22/2016   Procedure: Peripheral Vascular Balloon Angioplasty;  Surgeon: Serafina Mitchell, MD;  Location: [facility];  Service: Cardiovascular;;  Lt. Fistula  PERIPHERAL VASCULAR BALLOON ANGIOPLASTY Left 03/22/2018   Procedure: PERIPHERAL VASCULAR BALLOON ANGIOPLASTY;  Surgeon: Serafina Mitchell, MD;  Location: [place];  Service: Cardiovascular;  Laterality: Left;  Arm shunt   PERIPHERAL VASCULAR CATHETERIZATION N/A 06/23/2016   Procedure: Abdominal Aortogram w/Lower Extremity;  Surgeon: Serafina Mitchell, MD;  Location: [facility] [facility];  Service: Cardiovascular;  Laterality: N/A;   PERIPHERAL VASCULAR CATHETERIZATION  06/23/2016   Procedure: Peripheral Vascular Intervention;  Surgeon: Serafina Mitchell, MD;  Location: [place] [facility];  Service: Cardiovascular;;  lt common and external illiac artery   PHOTOCOAGULATION WITH LASER Left 12/20/2018   Procedure: PHOTOCOAGULATION WITH LASER;  Surgeon: Jalene Mullet, MD;  Location: [facility];  Service: Ophthalmology;  Laterality: Left;   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   Procedure: REPAIR OF COMPLEX TRACTION RETINAL DETACHMENT;  Surgeon: Jalene Mullet, MD;  Location: [place];  Service: Ophthalmology;  Laterality: Left;   REVISION OF ARTERIOVENOUS GORETEX GRAFT Left 09/05/2020   Procedure: REVISION OF ARTERIOVENOUS GORETEX GRAFT LEFT;  Surgeon: Cherre Robins, MD;  Location: [facility];  Service: Vascular;  Laterality: Left;    FAMILY HISTORY Family History  Problem Relation Age of Onset   Other Mother        not sure of cause of death   Diabetes Father    Diabetes Sister    Diabetes Sister    Pancreatic  cancer Maternal Grandmother    Colon cancer Neg Hx     SOCIAL HISTORY Social History   Tobacco Use   Smoking status: Former    Packs/day: 0.35    Years: 40.00    Pack years: 14.00    Types: Cigarettes    Quit date: 05/10/2012    Years since quitting: 9.1   Smokeless tobacco: Never  Vaping Use   Vaping Use: Never used  Substance Use Topics   Alcohol use: No   Drug use: No    Comment: marijuana; quit in early 1980's         OPHTHALMIC EXAM:  Base Eye Exam     Visual Acuity (ETDRS)       Right Left   Dist [place] 20/60 20/100   Dist ph [place]  20/80         Tonometry (Tonopen, 9:31 AM)       Right Left   Pressure 22 21         Pupils       Pupils APD   Right PERRL None   Left PERRL None         Visual Fields       Left Right    Full Full         Extraocular Movement       Right Left    Full, Ortho Full, Ortho         Neuro/Psych     Oriented x3: Yes   Mood/Affect: Normal         Dilation     Both eyes: 1.0% Mydriacyl, 2.5% Phenylephrine @ 9:31 AM           Slit Lamp and Fundus Exam     External Exam       Right Left   External Normal Normal         Slit Lamp Exam       Right Left   Lids/Lashes Normal Normal   Conjunctiva/Sclera White and quiet White and quiet   Cornea Clear Clear  Anterior Chamber Deep and quiet Deep and quiet   Iris Round and reactive Round and reactive   Lens Centered posterior chamber intraocular lens Centered posterior chamber intraocular lens   Anterior Vitreous Normal Normal, remnant         Fundus Exam       Right Left   Posterior Vitreous Clear, avitric Clear, avitric   Disc 1+ Pallor 1+ Pallor   C/D Ratio 0.5 0.5   Macula Microaneurysms, Moderate clinically significant macular edema, Epiretinal membrane over the fovea and extending inferotemporal to an old fibrous tissue island Severe clinically significant macular edema appears to emanate from region temporal aspect of the fovea  with large cluster of microaneurysms   Vessels PDR-active with NVE inferior to nerve NVE temporal portion of macula, responsive for traction on epiretinal membrane fovea Clear resection of previous fibrovascular disease clear avitric   Periphery Good peripheral PRP Good peripheral PRP            IMAGING AND PROCEDURES  Imaging and Procedures for 07/01/21  OCT, Retina - OU - Both Eyes       Right Eye Quality was good. Scan locations included subfoveal. Central Foveal Thickness: 506. Progression has improved. Findings include abnormal foveal contour.   Left Eye Quality was good. Scan locations included subfoveal, extrafoveal. Central Foveal Thickness: 740. Progression has been stable. Findings include abnormal foveal contour, subretinal fluid.   Notes OD, vastly improved macular anatomy, still with a taut ILM now over the FAZ, seen clinically as well as on OCT.  We will address CSME OD today with an eye vegF, and may need vitrectomy ILM peel OD in the future  OS with massive CSME, will need antivegF soon followed thereafter by focal laser treatment temporally        Intravitreal Injection, Pharmacologic Agent - OD - Right Eye       Time Out 07/01/2021. 10:01 AM. Confirmed correct patient, procedure, site, and patient consented.   Anesthesia Topical anesthesia was used. Anesthetic medications included Lidocaine 4%.   Procedure Preparation included Tobramycin 0.3%, Ofloxacin , 10% betadine to eyelids, 5% betadine to ocular surface. A 30 gauge needle was used.   Injection: 2.5 mg bevacizumab 2.5 MG/0.1ML   Route: Intravitreal, Site: Right Eye   NDC: [phone], Lot: [id number]   Post-op Post injection exam found visual acuity of at least counting fingers. The patient tolerated the procedure well. There were no complications. The patient received written and verbal post procedure care education. Post injection medications were not given.               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, associated with type 2 diabetes mellitus ([place])  S06.3016     3. Macular pucker, right eye  H35.371     4. 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 type 2 diabetes mellitus ([place])  W10.9323       1.  OD, vastly improved overall, with clear media and no longer tractional retinal detachment however new ILM striae are affecting the macular topographic findings in addition to CSME.  We will deliver antivegF, Avastin intravitreal OD today to control CSME and OD may 1-Day need vitrectomy ILM peel  2.  OS with chronic CSME massive, will deliver antivegF followed thereafter by focal laser treatment to large cluster of microaneurysms leaking on the temporal aspect of the macula  3.  Ophthalmic Meds Ordered this visit:  Meds ordered this encounter  Medications   bevacizumab  (AVASTIN) SOSY 2.5 mg       Return in about 2 weeks (around 07/15/2021) for dilate, OS, AVASTIN OCT,, return 5 to 6 weeks OD dilate Avastin OCT.  There are no Patient Instructions on file for this visit.   Explained the diagnoses, plan, and follow up with the patient and they expressed understanding.  Patient expressed understanding of the importance of proper follow up care.   Clent Demark Torri Michalski M.D. Diseases & Surgery of the Retina and Vitreous Retina & Diabetic [place] 07/01/21     Abbreviations: M myopia (nearsighted); A astigmatism; H hyperopia (farsighted); P presbyopia; Mrx spectacle prescription;  CTL contact lenses; OD right eye; OS left eye; OU both eyes  XT exotropia; ET esotropia; PEK punctate epithelial keratitis; PEE punctate epithelial erosions; DES dry eye syndrome; MGD meibomian gland dysfunction; ATs artificial tears; PFAT's preservative free artificial tears; [place] nuclear sclerotic cataract; PSC posterior subcapsular cataract; ERM epi-retinal membrane; PVD posterior vitreous detachment; RD retinal detachment; DM diabetes mellitus; DR diabetic retinopathy; NPDR non-proliferative diabetic retinopathy; PDR proliferative diabetic retinopathy; CSME clinically significant macular edema; DME diabetic macular edema; dbh dot blot hemorrhages; CWS cotton wool spot; POAG primary open angle glaucoma; C/D cup-to-disc ratio; HVF humphrey visual field; GVF goldmann visual field; OCT optical coherence tomography; IOP intraocular pressure; BRVO Branch retinal vein occlusion; CRVO central retinal vein occlusion; CRAO central retinal artery occlusion; BRAO branch retinal artery occlusion; RT retinal tear; SB scleral buckle; PPV pars plana vitrectomy; VH Vitreous hemorrhage; PRP panretinal laser photocoagulation; IVK intravitreal kenalog; VMT vitreomacular traction; MH Macular hole;  NVD neovascularization of the disc; NVE neovascularization elsewhere; AREDS age related eye disease study; ARMD age  related macular degeneration; POAG primary open angle glaucoma; EBMD epithelial/anterior basement membrane dystrophy; ACIOL anterior chamber intraocular lens; IOL intraocular lens; PCIOL posterior chamber intraocular lens; Phaco/IOL phacoemulsification with intraocular lens placement; [place] photorefractive keratectomy; LASIK laser assisted in situ keratomileusis; HTN hypertension; DM diabetes mellitus; COPD chronic obstructive pulmonary disease

## 2021-07-01 NOTE — Assessment & Plan Note (Signed)
Diabetic macular edema accounts for acuity OD will need treatment with intravitreal Avastin OD today

## 2021-07-01 NOTE — Assessment & Plan Note (Signed)
Severe epiretinal membrane, ILM striae may need resection again in the future

## 2021-07-01 NOTE — Assessment & Plan Note (Signed)
Minor epiretinal membrane recurrence over the fovea, ILM striae

## 2021-07-01 NOTE — Assessment & Plan Note (Signed)
Chronic CSME OS, will likely need focal laser treatment after next antivegF OS

## 2021-07-02 DIAGNOSIS — N2581 Secondary hyperparathyroidism of renal origin: Secondary | ICD-10-CM | POA: Diagnosis not present

## 2021-07-02 DIAGNOSIS — N186 End stage renal disease: Secondary | ICD-10-CM | POA: Diagnosis not present

## 2021-07-02 DIAGNOSIS — Z992 Dependence on renal dialysis: Secondary | ICD-10-CM | POA: Diagnosis not present

## 2021-07-02 DIAGNOSIS — E876 Hypokalemia: Secondary | ICD-10-CM | POA: Diagnosis not present

## 2021-07-02 DIAGNOSIS — D631 Anemia in chronic kidney disease: Secondary | ICD-10-CM | POA: Diagnosis not present

## 2021-07-04 DIAGNOSIS — E876 Hypokalemia: Secondary | ICD-10-CM | POA: Diagnosis not present

## 2021-07-04 DIAGNOSIS — N2581 Secondary hyperparathyroidism of renal origin: Secondary | ICD-10-CM | POA: Diagnosis not present

## 2021-07-04 DIAGNOSIS — Z992 Dependence on renal dialysis: Secondary | ICD-10-CM | POA: Diagnosis not present

## 2021-07-04 DIAGNOSIS — N186 End stage renal disease: Secondary | ICD-10-CM | POA: Diagnosis not present

## 2021-07-04 DIAGNOSIS — D631 Anemia in chronic kidney disease: Secondary | ICD-10-CM | POA: Diagnosis not present

## 2021-07-07 DIAGNOSIS — N186 End stage renal disease: Secondary | ICD-10-CM | POA: Diagnosis not present

## 2021-07-07 DIAGNOSIS — N2581 Secondary hyperparathyroidism of renal origin: Secondary | ICD-10-CM | POA: Diagnosis not present

## 2021-07-07 DIAGNOSIS — Z992 Dependence on renal dialysis: Secondary | ICD-10-CM | POA: Diagnosis not present

## 2021-07-07 DIAGNOSIS — D631 Anemia in chronic kidney disease: Secondary | ICD-10-CM | POA: Diagnosis not present

## 2021-07-07 DIAGNOSIS — E876 Hypokalemia: Secondary | ICD-10-CM | POA: Diagnosis not present

## 2021-07-08 ENCOUNTER — Ambulatory Visit (INDEPENDENT_AMBULATORY_CARE_PROVIDER_SITE_OTHER): Payer: Medicare Other | Admitting: *Deleted

## 2021-07-08 ENCOUNTER — Telehealth: Payer: Self-pay | Admitting: Orthopedic Surgery

## 2021-07-08 DIAGNOSIS — J309 Allergic rhinitis, unspecified: Secondary | ICD-10-CM

## 2021-07-08 MED ORDER — GUAIFENESIN-CODEINE 100-10 MG/5ML PO SOLN: 5.0000 mL | Freq: Three times a day (TID) | ORAL | 0 refills | Status: DC | PRN

## 2021-07-08 NOTE — Progress Notes (Signed)
Patient came in for shot and requested a cough medicine.  I printed out the prescription for 1 episode of codeine.  Salvatore Marvel, MD [facility] [place] [facility]

## 2021-07-08 NOTE — Progress Notes (Signed)
EXP 07/10/22

## 2021-07-08 NOTE — Addendum Note (Signed)
Addended by: Valentina Shaggy on: 07/08/2021 10:41 AM   Modules accepted: Orders

## 2021-07-09 DIAGNOSIS — N186 End stage renal disease: Secondary | ICD-10-CM | POA: Diagnosis not present

## 2021-07-09 DIAGNOSIS — E876 Hypokalemia: Secondary | ICD-10-CM | POA: Diagnosis not present

## 2021-07-09 DIAGNOSIS — Z992 Dependence on renal dialysis: Secondary | ICD-10-CM | POA: Diagnosis not present

## 2021-07-09 DIAGNOSIS — N2581 Secondary hyperparathyroidism of renal origin: Secondary | ICD-10-CM | POA: Diagnosis not present

## 2021-07-09 DIAGNOSIS — D631 Anemia in chronic kidney disease: Secondary | ICD-10-CM | POA: Diagnosis not present

## 2021-07-10 DIAGNOSIS — J3081 Allergic rhinitis due to animal (cat) (dog) hair and dander: Secondary | ICD-10-CM | POA: Diagnosis not present

## 2021-07-11 DIAGNOSIS — D631 Anemia in chronic kidney disease: Secondary | ICD-10-CM | POA: Diagnosis not present

## 2021-07-11 DIAGNOSIS — E876 Hypokalemia: Secondary | ICD-10-CM | POA: Diagnosis not present

## 2021-07-11 DIAGNOSIS — N2581 Secondary hyperparathyroidism of renal origin: Secondary | ICD-10-CM | POA: Diagnosis not present

## 2021-07-11 DIAGNOSIS — Z992 Dependence on renal dialysis: Secondary | ICD-10-CM | POA: Diagnosis not present

## 2021-07-11 DIAGNOSIS — N186 End stage renal disease: Secondary | ICD-10-CM | POA: Diagnosis not present

## 2021-07-11 DIAGNOSIS — J3089 Other allergic rhinitis: Secondary | ICD-10-CM | POA: Diagnosis not present

## 2021-07-14 DIAGNOSIS — Z992 Dependence on renal dialysis: Secondary | ICD-10-CM | POA: Diagnosis not present

## 2021-07-14 DIAGNOSIS — N2581 Secondary hyperparathyroidism of renal origin: Secondary | ICD-10-CM | POA: Diagnosis not present

## 2021-07-14 DIAGNOSIS — D631 Anemia in chronic kidney disease: Secondary | ICD-10-CM | POA: Diagnosis not present

## 2021-07-14 DIAGNOSIS — N186 End stage renal disease: Secondary | ICD-10-CM | POA: Diagnosis not present

## 2021-07-14 DIAGNOSIS — E876 Hypokalemia: Secondary | ICD-10-CM | POA: Diagnosis not present

## 2021-07-15 ENCOUNTER — Encounter (INDEPENDENT_AMBULATORY_CARE_PROVIDER_SITE_OTHER): Payer: Medicare Other | Admitting: Ophthalmology

## 2021-07-15 DIAGNOSIS — F17210 Nicotine dependence, cigarettes, uncomplicated: Secondary | ICD-10-CM | POA: Diagnosis not present

## 2021-07-15 DIAGNOSIS — H269 Unspecified cataract: Secondary | ICD-10-CM | POA: Diagnosis not present

## 2021-07-15 DIAGNOSIS — Z23 Encounter for immunization: Secondary | ICD-10-CM | POA: Diagnosis not present

## 2021-07-15 DIAGNOSIS — E113599 Type 2 diabetes mellitus with proliferative diabetic retinopathy without macular edema, unspecified eye: Secondary | ICD-10-CM | POA: Diagnosis not present

## 2021-07-15 DIAGNOSIS — E1122 Type 2 diabetes mellitus with diabetic chronic kidney disease: Secondary | ICD-10-CM | POA: Diagnosis not present

## 2021-07-15 DIAGNOSIS — I132 Hypertensive heart and chronic kidney disease with heart failure and with stage 5 chronic kidney disease, or end stage renal disease: Secondary | ICD-10-CM | POA: Diagnosis not present

## 2021-07-15 DIAGNOSIS — E785 Hyperlipidemia, unspecified: Secondary | ICD-10-CM | POA: Diagnosis not present

## 2021-07-15 DIAGNOSIS — F419 Anxiety disorder, unspecified: Secondary | ICD-10-CM | POA: Diagnosis not present

## 2021-07-15 DIAGNOSIS — Z79899 Other long term (current) drug therapy: Secondary | ICD-10-CM | POA: Diagnosis not present

## 2021-07-15 DIAGNOSIS — N186 End stage renal disease: Secondary | ICD-10-CM | POA: Diagnosis not present

## 2021-07-15 DIAGNOSIS — I509 Heart failure, unspecified: Secondary | ICD-10-CM | POA: Diagnosis not present

## 2021-07-15 DIAGNOSIS — D689 Coagulation defect, unspecified: Secondary | ICD-10-CM | POA: Diagnosis not present

## 2021-07-16 ENCOUNTER — Encounter (INDEPENDENT_AMBULATORY_CARE_PROVIDER_SITE_OTHER): Payer: Medicare Other | Admitting: Ophthalmology

## 2021-07-16 DIAGNOSIS — E876 Hypokalemia: Secondary | ICD-10-CM | POA: Diagnosis not present

## 2021-07-16 DIAGNOSIS — N2581 Secondary hyperparathyroidism of renal origin: Secondary | ICD-10-CM | POA: Diagnosis not present

## 2021-07-16 DIAGNOSIS — Z992 Dependence on renal dialysis: Secondary | ICD-10-CM | POA: Diagnosis not present

## 2021-07-16 DIAGNOSIS — D631 Anemia in chronic kidney disease: Secondary | ICD-10-CM | POA: Diagnosis not present

## 2021-07-16 DIAGNOSIS — N186 End stage renal disease: Secondary | ICD-10-CM | POA: Diagnosis not present

## 2021-07-17 ENCOUNTER — Encounter (INDEPENDENT_AMBULATORY_CARE_PROVIDER_SITE_OTHER): Payer: Self-pay | Admitting: Ophthalmology

## 2021-07-17 ENCOUNTER — Other Ambulatory Visit: Payer: Self-pay

## 2021-07-17 ENCOUNTER — Ambulatory Visit (INDEPENDENT_AMBULATORY_CARE_PROVIDER_SITE_OTHER): Payer: Medicare Other | Admitting: Ophthalmology

## 2021-07-17 DIAGNOSIS — E113512 Type 2 diabetes mellitus with proliferative diabetic retinopathy with macular edema, left eye: Secondary | ICD-10-CM | POA: Diagnosis not present

## 2021-07-17 DIAGNOSIS — H35371 Puckering of macula, right eye: Secondary | ICD-10-CM

## 2021-07-17 DIAGNOSIS — E113521 Type 2 diabetes mellitus with proliferative diabetic retinopathy with traction retinal detachment involving the macula, right eye: Secondary | ICD-10-CM

## 2021-07-17 MED ORDER — BEVACIZUMAB 2.5 MG/0.1ML IZ SOSY
2.5000 mg | PREFILLED_SYRINGE | INTRAVITREAL | Status: AC | PRN
  Administered 2021-07-17: 11:00:00 2.5 mg via INTRAVITREAL

## 2021-07-17 NOTE — Assessment & Plan Note (Signed)
OD, post vitrectomy epiretinal membrane is now forming with thickening with no accompanying CME.  We will monitor this and may need vitrectomy ILM peel in the future

## 2021-07-17 NOTE — Progress Notes (Signed)
07/17/2021     CHIEF COMPLAINT Patient presents for  Chief Complaint  Patient presents with   Retina Follow Up      HISTORY OF PRESENT ILLNESS: Destiny Day is a 70 y.o. female who presents to the clinic today for:   HPI     Retina Follow Up           Diagnosis: Diabetic Retinopathy   Laterality: left eye   Onset: 3 months ago   Severity: mild   Duration: 3 months   Course: stable         Comments   3 month fu os and oct and Avastin OS  Pt states VA OU stable since last visit. Pt denies FOL, floaters, or ocular pain OU.  Pt states, "I am not noticing any change. I just need some glasses that magnify."  Pt reports that A1C: 6.4        Last edited by Kendra Opitz, COA on 07/17/2021 10:20 AM.      Referring physician: Cipriano Mile, NP [place],  [place] [address]  HISTORICAL INFORMATION:   Selected notes from the MEDICAL RECORD NUMBER    Lab Results  Component Value Date   HGBA1C 5.6 06/26/2019     CURRENT MEDICATIONS: No current outpatient medications on file. (Ophthalmic Drugs)   No current facility-administered medications for this visit. (Ophthalmic Drugs)   Current Outpatient Medications (Other)  Medication Sig   albuterol (PROAIR HFA) 108 (90 Base) MCG/ACT inhaler Inhale 2 puffs into the lungs every 4 (four) hours as needed for wheezing or shortness of breath.   amLODipine (NORVASC) 10 MG tablet Take 10 mg by mouth at bedtime.   aspirin EC 81 MG tablet Take 81 mg by mouth daily.   atorvastatin (LIPITOR) 40 MG tablet Take 1 tablet (40 mg total) by mouth daily.   busPIRone (BUSPAR) 7.5 MG tablet Take 7.5 mg by mouth 2 (two) times daily.   carvedilol (COREG) 12.5 MG tablet Take 12.5 mg by mouth See admin instructions. Take 1 tablet (12.5 mg) by mouth once daily in the evening on Mondays, Wednesdays, & Fridays. (Dialysis) Take 1 tablet (12.5 mg) by mouth twice daily on Sundays, Tuesdays, Thursdays, & Saturdays. (Non  Dialysis)   cinacalcet (SENSIPAR) 60 MG tablet Take 90 mg by mouth See admin instructions. Take 1 tablet (90 mg) by mouth in the evening on Mondays, Wednesdays, & Fridays (dialysis) Take 1 tablet (90 mg) by mouth twice daily on Sundays, Tuesdays, Thursdays, Saturdays. (non dialysis)   docusate sodium (COLACE) 100 MG capsule Take 1 capsule (100 mg total) by mouth 2 (two) times daily.   EPINEPHrine (AUVI-Q) 0.3 mg/0.3 mL IJ SOAJ injection Inject 0.3 mg into the muscle as needed for anaphylaxis.   ethyl chloride spray SMARTSIG:1 Spray(s) Topical 3 Times a Week   famotidine (PEPCID) 10 MG tablet Take 1 tablet (10 mg total) by mouth every other day. (Patient not taking: Reported on 06/24/2021)   ferric citrate (AURYXIA) 1 GM 210 MG(Fe) tablet Take 210-420 mg by mouth See admin instructions. Take 2 tablets (420 mg) by mouth with each meal & take 1 tablet (210 mg) by mouth with snacks.   fluticasone (FLONASE) 50 MCG/ACT nasal spray Place 2 sprays into both nostrils daily as needed for allergies or rhinitis.   fluticasone-salmeterol (ADVAIR HFA) 115-21 MCG/ACT inhaler Inhale two puffs twice daily   gabapentin (NEURONTIN) 100 MG capsule TAKE 1 CAPSULE (100 MG TOTAL) BY MOUTH AT BEDTIME. WHEN  NECESSARY FOR NEUROPATHY PAIN (Patient taking differently: Take 100 mg by mouth at bedtime as needed (pain). When necessary for neuropathy pain)   glipiZIDE (GLUCOTROL) 5 MG tablet Take 5 mg by mouth daily before breakfast.   guaiFENesin-codeine 100-10 MG/5ML syrup Take 5 mLs by mouth 3 (three) times daily as needed for cough.   ipratropium (ATROVENT) 0.06 % nasal spray Place 2 sprays into both nostrils 4 (four) times daily.   Isosorb Dinitrate-hydrALAZINE (BIDIL PO) Take by mouth.   isosorbide-hydrALAZINE (BIDIL) 20-37.5 MG tablet Take 1 tablet by mouth 3 (three) times daily. BID on days of dialysis   lidocaine-prilocaine (EMLA) cream Apply 1 application topically 3 (three) times a week. 1-2 hours prior to dialysis    montelukast (SINGULAIR) 10 MG tablet TAKE 1 TABLET BY MOUTH EVERYDAY AT BEDTIME   multivitamin (RENA-VIT) TABS tablet Take 1 tablet by mouth daily.    omeprazole (PRILOSEC) 20 MG capsule Take 20 mg by mouth daily.    ondansetron (ZOFRAN) 4 MG tablet Take 1 tablet (4 mg total) by mouth every 6 (six) hours as needed for nausea.   polyethylene glycol (MIRALAX / GLYCOLAX) 17 g packet Take 17 g by mouth daily.   Spacer/Aero-Holding Chambers (AEROCHAMBER PLUS) inhaler Use with inhaler   traZODone (DESYREL) 50 MG tablet Take 50 mg by mouth at bedtime as needed for sleep.   Vitamin D, Ergocalciferol, (DRISDOL) 1.25 MG (50000 UT) CAPS capsule Take 50,000 Units by mouth every Sunday. Takes on Sunday   No current facility-administered medications for this visit. (Other)      REVIEW OF SYSTEMS:    ALLERGIES Allergies  Allergen Reactions   Adhesive  [Tape] Hives   Lisinopril Cough   Prednisone Swelling and Other (See Comments)    Excessive fluid buildup    PAST MEDICAL HISTORY Past Medical History:  Diagnosis Date   Abdominal bruit    Anxiety    Arthritis    Osteoarthritis   Asthma    Cervical disc disease    "pinced nerve"   CHF (congestive heart failure) (HCC)    Diabetes mellitus    Type II   Diverticulitis    ESRD (end stage renal disease) ([place])    dialysis - M/W/F- [place]   GERD (gastroesophageal reflux disease)    from medications   GI bleed 03/31/2013   Head injury 07/2017   History of hiatal hernia    Hyperlipidemia    Hypertension    Neuropathy    left leg   Nuclear sclerotic cataract of right eye 04/23/2020   Osteoporosis    Peripheral vascular disease ([place])    Pneumonia    "very young" and a few years ago   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)    Vitrectomy membrane peel endolaser 05-07-2021   Seasonal allergies    Shortness of breath dyspnea    WIth exertion, when fluid builds    Sleep apnea    can't afford cpap    Vitreomacular traction syndrome, 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   05-07-2021  Vitrectomy, membrane peel, PRP, resection posterior segment of NVD, remnants of peripheral NVE remain in place due to atrophic retina, no vitreous substitute   Past Surgical History:  Procedure Laterality Date   A/V SHUNTOGRAM N/A 09/22/2016   Procedure: A/V Shuntogram - left arm;  Surgeon: Serafina Mitchell, MD;  Location: [facility];  Service: Cardiovascular;  Laterality: N/A;   A/V SHUNTOGRAM N/A 03/22/2018   Procedure: A/V  SHUNTOGRAM - left arm;  Surgeon: Serafina Mitchell, MD;  Location: [facility];  Service: Cardiovascular;  Laterality: N/A;   A/V SHUNTOGRAM Left 11/17/2018   Procedure: A/V SHUNTOGRAM;  Surgeon: Angelia Mould, MD;  Location: [facility];  Service: Cardiovascular;  Laterality: Left;   ABDOMINAL HYSTERECTOMY  1993`   AMPUTATION Left 09/01/2016   Procedure: LEFT FOOT TRANSMETATARSAL AMPUTATION;  Surgeon: Newt Minion, MD;  Location: [facility];  Service: Orthopedics;  Laterality: Left;   AMPUTATION Right 08/11/2017   Procedure: RIGHT GREAT TOE AMPUTATION DIGIT;  Surgeon: Rosetta Posner, MD;  Location: [facility];  Service: Vascular;  Laterality: Right;   AMPUTATION Right 12/31/2017   Procedure: RIGHT TRANSMETATARSAL AMPUTATION;  Surgeon: Newt Minion, MD;  Location: [facility];  Service: Orthopedics;  Laterality: Right;   AV FISTULA PLACEMENT Left 04/21/2016   Procedure: INSERTION OF ARTERIOVENOUS (AV) GORE-TEX GRAFT ARM LEFT;  Surgeon: Elam Dutch, MD;  Location: [facility];  Service: Vascular;  Laterality: Left;   BASCILIC VEIN TRANSPOSITION Left 07/10/2014   Procedure: BASCILIC VEIN TRANSPOSITION;  Surgeon: Angelia Mould, MD;  Location: [facility];  Service: Vascular;  Laterality: Left;   [place] Right 11/08/2014   Procedure: FIRST STAGE BASILIC VEIN TRANSPOSITION;  Surgeon: Angelia Mould, MD;  Location: [facility];  Service: Vascular;   Laterality: Right;   [place] Right 01/18/2015   Procedure: SECOND STAGE BASILIC VEIN TRANSPOSITION;  Surgeon: Angelia Mould, MD;  Location: [facility];  Service: Vascular;  Laterality: Right;   CRANIOTOMY N/A 08/23/2017   Procedure: CRANIOTOMY HEMATOMA EVACUATION SUBDURAL;  Surgeon: Ashok Pall, MD;  Location: [facility];  Service: Neurosurgery;  Laterality: N/A;   CRANIOTOMY Left 08/24/2017   Procedure: CRANIOTOMY FOR RECURRENT ACUTE SUBDURAL HEMATOMA;  Surgeon: Ashok Pall, MD;  Location: [facility];  Service: Neurosurgery;  Laterality: Left;   ESOPHAGOGASTRODUODENOSCOPY N/A 03/31/2013   Procedure: ESOPHAGOGASTRODUODENOSCOPY (EGD);  Surgeon: Gatha Mayer, MD;  Location: [facility];  Service: Endoscopy;  Laterality: N/A;   EYE SURGERY     laser surgery   FEMORAL-POPLITEAL BYPASS GRAFT Left 07/08/2016   Procedure: LEFT  FEMORAL-BELOW KNEE POPLITEAL ARTERY BYPASS GRAFT USING 6MM X 80 CM PROPATEN GORETEX GRAFT WITH RINGS.;  Surgeon: Rosetta Posner, MD;  [facility];  Service: Vascular;  Laterality: Left;   FEMORAL-POPLITEAL BYPASS GRAFT Right 08/11/2017   Procedure: RIGHT FEMORAL TO BELOW KNEE POPLITEAL ARTERKY  BYPASS GRAFT USING 6MM RINGED PROPATEN GRAFT;  Surgeon: Rosetta Posner, MD;  Location: [facility];  Service: Vascular;  Laterality: Right;   FISTULOGRAM Left 10/29/2014   Procedure: FISTULOGRAM;  Surgeon: Angelia Mould, MD;  Location: [facility];  Service: Cardiovascular;  Laterality: Left;   GAS/FLUID EXCHANGE Left 12/20/2018   Procedure: AIR GAS EXCHANGE;  Surgeon: Jalene Mullet, MD;  Location: [facility];  Service: Ophthalmology;  Laterality: Left;   INSERTION OF DIALYSIS CATHETER N/A 09/05/2020   Procedure: INSERTION OF DIALYSIS CATHETER;  Surgeon: Cherre Robins, MD;  Location: [facility];  Service: Vascular;  Laterality: N/A;   INSERTION OF DIALYSIS CATHETER Left 03/13/2021   Procedure: INSERTION OF DIALYSIS CATHETER;  Surgeon: Waynetta Sandy, MD;  Location: [facility];  Service: Vascular;  Laterality: Left;   KNEE ARTHROSCOPY Right 05/06/2018   Procedure: RIGHT KNEE ARTHROSCOPY;  Surgeon: Newt Minion, MD;  Location: [facility];  Service: Orthopedics;  Laterality: Right;   KNEE ARTHROSCOPY Right 06/10/2018   Procedure: RIGHT KNEE ARTHROSCOPY  AND DEBRIDEMENT;  Surgeon: Newt Minion, MD;  Location: [facility];  Service: Orthopedics;  Laterality: Right;   LIGATION OF ARTERIOVENOUS  FISTULA Left 04/21/2016   Procedure: LIGATION OF ARTERIOVENOUS  FISTULA LEFT ARM;  Surgeon: Elam Dutch, MD;  Location: [facility];  Service: Vascular;  Laterality: Left;   LOWER EXTREMITY ANGIOGRAPHY N/A 04/06/2017   Procedure: Lower Extremity Angiography - Right;  Surgeon: Serafina Mitchell, MD;  Location: [facility];  Service: Cardiovascular;  Laterality: N/A;   MEMBRANE PEEL Left 12/20/2018   Procedure: MEMBRANE PEEL;  Surgeon: Jalene Mullet, MD;  Location: [facility];  Service: Ophthalmology;  Laterality: Left;   PATCH ANGIOPLASTY Right 01/18/2015   Procedure: BASILIC VEIN PATCH ANGIOPLASTY USING VASCUGUARD PATCH;  Surgeon: Angelia Mould, MD;  Location: [place];  Service: Vascular;  Laterality: Right;   PERIPHERAL VASCULAR BALLOON ANGIOPLASTY  09/22/2016   Procedure: Peripheral Vascular Balloon Angioplasty;  Surgeon: Serafina Mitchell, MD;  Location: [facility];  Service: Cardiovascular;;  Lt. Fistula   PERIPHERAL VASCULAR BALLOON ANGIOPLASTY Left 03/22/2018   Procedure: PERIPHERAL VASCULAR BALLOON ANGIOPLASTY;  Surgeon: Serafina Mitchell, MD;  Location: [facility];  Service: Cardiovascular;  Laterality: Left;  Arm shunt   PERIPHERAL VASCULAR CATHETERIZATION N/A 06/23/2016   Procedure: Abdominal Aortogram w/Lower Extremity;  Surgeon: Serafina Mitchell, MD;  Location: [facility];  Service: Cardiovascular;  Laterality: N/A;   PERIPHERAL VASCULAR CATHETERIZATION  06/23/2016   Procedure: Peripheral Vascular Intervention;  Surgeon: Serafina Mitchell, MD;  Location: [facility];  Service: Cardiovascular;;  lt common and external illiac artery   PHOTOCOAGULATION WITH LASER Left 12/20/2018   Procedure: PHOTOCOAGULATION WITH LASER;  Surgeon: Jalene Mullet, MD;  Location: [facility] [facility];  Service: Ophthalmology;  Laterality: Left;   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   Procedure: REPAIR OF COMPLEX TRACTION RETINAL DETACHMENT;  Surgeon: Jalene Mullet, MD;  Location: [facility];  Service: Ophthalmology;  Laterality: Left;   REVISION OF ARTERIOVENOUS GORETEX GRAFT Left 09/05/2020   Procedure: REVISION OF ARTERIOVENOUS GORETEX GRAFT LEFT;  Surgeon: Cherre Robins, MD;  Location: [facility];  Service: Vascular;  Laterality: Left;    FAMILY HISTORY Family History  Problem Relation Age of Onset   Other Mother        not sure of cause of death   Diabetes Father    Diabetes Sister    Diabetes Sister    Pancreatic cancer Maternal Grandmother    Colon cancer Neg Hx     SOCIAL HISTORY Social History   Tobacco Use   Smoking status: Former    Packs/day: 0.35    Years: 40.00    Pack years: 14.00    Types: Cigarettes    Quit date: 05/10/2012    Years since quitting: 9.1   Smokeless tobacco: Never  Vaping Use   Vaping Use: Never used  Substance Use Topics   Alcohol use: No   Drug use: No    Comment: marijuana; quit in early 1980's         OPHTHALMIC EXAM:  Base Eye Exam     Visual Acuity (ETDRS)       Right Left   Dist [place] 20/50 -1 20/200 -1   Dist ph [place] NI 20/100 -1         Tonometry (Tonopen, 10:26 AM)       Right Left   Pressure 15 15  Pupils       Pupils Dark Light Shape React APD   Right PERRL 3 3 Round Minimal None   Left PERRL 3 3 Round Minimal None         Visual Fields (Counting fingers)       Left Right    Full Full         Extraocular Movement       Right Left    Full, Ortho Full, Ortho         Neuro/Psych     Oriented x3: Yes   Mood/Affect: Normal         Dilation      Left eye: 1.0% Mydriacyl, 2.5% Phenylephrine @ 10:26 AM           Slit Lamp and Fundus Exam     External Exam       Right Left   External Normal Normal         Slit Lamp Exam       Right Left   Lids/Lashes Normal Normal   Conjunctiva/Sclera White and quiet White and quiet   Cornea Clear Clear   Anterior Chamber Deep and quiet Deep and quiet   Iris Round and reactive Round and reactive   Lens Centered posterior chamber intraocular lens Centered posterior chamber intraocular lens   Anterior Vitreous Normal Normal, remnant         Fundus Exam       Right Left   Posterior Vitreous  Clear, avitric   Disc  1+ Pallor   C/D Ratio  0.5   Macula  Severe clinically significant macular edema appears to emanate from region temporal aspect of the fovea with large cluster of microaneurysms   Vessels  Clear resection of previous fibrovascular disease clear avitric   Periphery  Good peripheral PRP            IMAGING AND PROCEDURES  Imaging and Procedures for 07/17/21  Intravitreal Injection, Pharmacologic Agent - OS - Left Eye       Time Out 07/17/2021. 10:45 AM. Confirmed correct patient, procedure, site, and patient consented.   Anesthesia Topical anesthesia was used. Anesthetic medications included Lidocaine 4%.   Procedure Preparation included Tobramycin 0.3%, 5% betadine to ocular surface, 10% betadine to eyelids. A 30 gauge needle was used.   Injection: 2.5 mg bevacizumab 2.5 MG/0.1ML   Route: Intravitreal, Site: Left Eye   NDC: [phone], Lot: [id number]   Post-op Post injection exam found visual acuity of at least counting fingers. The patient tolerated the procedure well. There were no complications. The patient received written and verbal post procedure care education. Post injection medications were not given.      OCT, Retina - OU - Both Eyes       Right Eye Quality was good. Scan locations included subfoveal. Central Foveal Thickness: 495.  Findings include epiretinal membrane.   Left Eye Quality was good. Central Foveal Thickness: 672. Progression has improved.   Notes OS with overall improved CSME yet still center involved.  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  Now at 2 months  post surgery.    Macular pucker, right eye OD, post vitrectomy epiretinal membrane is now forming with thickening with no accompanying CME.  We will monitor this and may need vitrectomy ILM peel in the future  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 type 2 diabetes mellitus (HCC) OS with chronic CSME.  We will repeat injection today at 8-month follow-up and assess the efficacy in 6 weeks     ICD-10-CM   1. 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 type 2 diabetes mellitus (HCC)  G38.7564 Intravitreal Injection, Pharmacologic Agent - OS - Left Eye    OCT, Retina - OU - Both Eyes    bevacizumab (AVASTIN) SOSY 2.5 mg    2. 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 Macular pucker, right eye  H35.371       1.  OS center involved CSME overall improved yet still active at 77-month interval today repeat injection today Avastin and follow-up next in 6 weeks, may consider focal laser treatment temporally to this large region of rather thick retina so as to minimize and decrease patient's treatment burden  2.  OD  will continue to monitor, improved acuity however there is a epiretinal membrane new, progressing and will monitor its effect over time  3.  Ophthalmic Meds Ordered this visit:  Meds ordered this encounter  Medications   bevacizumab (AVASTIN) SOSY 2.5 mg       Return in about 6 weeks (around 08/28/2021) for dilate, OS, AVASTIN OCT.  There are no Patient Instructions on file for this visit.   Explained the diagnoses, plan, and follow up with the patient and they expressed understanding.  Patient expressed understanding of the importance of proper follow up care.   Clent Demark Famous Eisenhardt M.D. Diseases & Surgery of the Retina and Vitreous Retina & Diabetic [place] 07/17/21     Abbreviations: M myopia (nearsighted); A astigmatism; H hyperopia (farsighted); P presbyopia; Mrx spectacle prescription;  CTL contact lenses; OD right eye; OS left eye; OU both eyes  XT exotropia; ET esotropia; PEK punctate epithelial keratitis; PEE punctate epithelial erosions; DES dry eye syndrome; MGD meibomian gland dysfunction; ATs artificial tears; PFAT's preservative free artificial tears; [place] nuclear sclerotic cataract; PSC posterior subcapsular cataract; ERM epi-retinal membrane; PVD posterior vitreous detachment; RD retinal detachment; DM diabetes mellitus; DR diabetic retinopathy; NPDR non-proliferative diabetic retinopathy; PDR proliferative diabetic retinopathy; CSME clinically significant macular edema; DME diabetic macular edema; dbh dot blot hemorrhages; CWS cotton wool spot; POAG primary open angle glaucoma; C/D cup-to-disc ratio; HVF humphrey visual field; GVF goldmann visual field; OCT optical coherence tomography; IOP intraocular pressure; BRVO Branch retinal vein occlusion; CRVO central retinal vein occlusion; CRAO central retinal artery occlusion; BRAO branch retinal artery occlusion; RT retinal tear; SB scleral buckle; PPV pars plana vitrectomy; VH Vitreous hemorrhage; PRP panretinal laser  photocoagulation; IVK intravitreal kenalog; VMT vitreomacular traction; MH Macular hole;  NVD neovascularization of the disc; NVE neovascularization elsewhere; AREDS age related eye disease study; ARMD age related macular degeneration; POAG primary open angle glaucoma; EBMD epithelial/anterior basement membrane dystrophy; ACIOL anterior chamber intraocular lens; IOL intraocular lens; PCIOL posterior chamber intraocular lens; Phaco/IOL phacoemulsification with intraocular lens placement; [place] photorefractive keratectomy; LASIK laser assisted in situ keratomileusis; HTN hypertension; DM diabetes mellitus; COPD chronic obstructive pulmonary disease

## 2021-07-17 NOTE — Assessment & Plan Note (Signed)
Macular anatomy OD vastly improved post vitrectomy for vitreal macular traction syndrome with secondary CME.  Now at 2 months post surgery.

## 2021-07-17 NOTE — Assessment & Plan Note (Signed)
OS with chronic CSME.  We will repeat injection today at 1-month follow-up and assess the efficacy in 6 weeks

## 2021-07-18 DIAGNOSIS — E876 Hypokalemia: Secondary | ICD-10-CM | POA: Diagnosis not present

## 2021-07-18 DIAGNOSIS — Z992 Dependence on renal dialysis: Secondary | ICD-10-CM | POA: Diagnosis not present

## 2021-07-18 DIAGNOSIS — N2581 Secondary hyperparathyroidism of renal origin: Secondary | ICD-10-CM | POA: Diagnosis not present

## 2021-07-18 DIAGNOSIS — N186 End stage renal disease: Secondary | ICD-10-CM | POA: Diagnosis not present

## 2021-07-18 DIAGNOSIS — D631 Anemia in chronic kidney disease: Secondary | ICD-10-CM | POA: Diagnosis not present

## 2021-07-21 DIAGNOSIS — N186 End stage renal disease: Secondary | ICD-10-CM | POA: Diagnosis not present

## 2021-07-21 DIAGNOSIS — E876 Hypokalemia: Secondary | ICD-10-CM | POA: Diagnosis not present

## 2021-07-21 DIAGNOSIS — D631 Anemia in chronic kidney disease: Secondary | ICD-10-CM | POA: Diagnosis not present

## 2021-07-21 DIAGNOSIS — Z992 Dependence on renal dialysis: Secondary | ICD-10-CM | POA: Diagnosis not present

## 2021-07-21 DIAGNOSIS — N2581 Secondary hyperparathyroidism of renal origin: Secondary | ICD-10-CM | POA: Diagnosis not present

## 2021-07-22 ENCOUNTER — Ambulatory Visit (INDEPENDENT_AMBULATORY_CARE_PROVIDER_SITE_OTHER): Payer: Medicare Other | Admitting: *Deleted

## 2021-07-22 DIAGNOSIS — J309 Allergic rhinitis, unspecified: Secondary | ICD-10-CM | POA: Diagnosis not present

## 2021-07-23 DIAGNOSIS — D631 Anemia in chronic kidney disease: Secondary | ICD-10-CM | POA: Diagnosis not present

## 2021-07-23 DIAGNOSIS — N186 End stage renal disease: Secondary | ICD-10-CM | POA: Diagnosis not present

## 2021-07-23 DIAGNOSIS — E876 Hypokalemia: Secondary | ICD-10-CM | POA: Diagnosis not present

## 2021-07-23 DIAGNOSIS — Z992 Dependence on renal dialysis: Secondary | ICD-10-CM | POA: Diagnosis not present

## 2021-07-23 DIAGNOSIS — N2581 Secondary hyperparathyroidism of renal origin: Secondary | ICD-10-CM | POA: Diagnosis not present

## 2021-07-24 ENCOUNTER — Other Ambulatory Visit: Payer: Self-pay

## 2021-07-24 ENCOUNTER — Ambulatory Visit (INDEPENDENT_AMBULATORY_CARE_PROVIDER_SITE_OTHER): Payer: Medicare Other | Admitting: Orthopedic Surgery

## 2021-07-24 DIAGNOSIS — Z89432 Acquired absence of left foot: Secondary | ICD-10-CM

## 2021-07-24 DIAGNOSIS — Z89431 Acquired absence of right foot: Secondary | ICD-10-CM | POA: Diagnosis not present

## 2021-07-25 DIAGNOSIS — N186 End stage renal disease: Secondary | ICD-10-CM | POA: Diagnosis not present

## 2021-07-25 DIAGNOSIS — N2581 Secondary hyperparathyroidism of renal origin: Secondary | ICD-10-CM | POA: Diagnosis not present

## 2021-07-25 DIAGNOSIS — Z992 Dependence on renal dialysis: Secondary | ICD-10-CM | POA: Diagnosis not present

## 2021-07-25 DIAGNOSIS — E876 Hypokalemia: Secondary | ICD-10-CM | POA: Diagnosis not present

## 2021-07-25 DIAGNOSIS — D631 Anemia in chronic kidney disease: Secondary | ICD-10-CM | POA: Diagnosis not present

## 2021-07-27 DIAGNOSIS — I159 Secondary hypertension, unspecified: Secondary | ICD-10-CM | POA: Diagnosis not present

## 2021-07-27 DIAGNOSIS — Z992 Dependence on renal dialysis: Secondary | ICD-10-CM | POA: Diagnosis not present

## 2021-07-27 DIAGNOSIS — N186 End stage renal disease: Secondary | ICD-10-CM | POA: Diagnosis not present

## 2021-07-28 DIAGNOSIS — D509 Iron deficiency anemia, unspecified: Secondary | ICD-10-CM | POA: Diagnosis not present

## 2021-07-28 DIAGNOSIS — E876 Hypokalemia: Secondary | ICD-10-CM | POA: Diagnosis not present

## 2021-07-28 DIAGNOSIS — T782XXA Anaphylactic shock, unspecified, initial encounter: Secondary | ICD-10-CM | POA: Diagnosis not present

## 2021-07-28 DIAGNOSIS — N186 End stage renal disease: Secondary | ICD-10-CM | POA: Diagnosis not present

## 2021-07-28 DIAGNOSIS — Z992 Dependence on renal dialysis: Secondary | ICD-10-CM | POA: Diagnosis not present

## 2021-07-28 DIAGNOSIS — D631 Anemia in chronic kidney disease: Secondary | ICD-10-CM | POA: Diagnosis not present

## 2021-07-28 DIAGNOSIS — N2581 Secondary hyperparathyroidism of renal origin: Secondary | ICD-10-CM | POA: Diagnosis not present

## 2021-07-29 ENCOUNTER — Telehealth: Payer: Self-pay | Admitting: Orthopedic Surgery

## 2021-07-29 ENCOUNTER — Encounter: Payer: Self-pay | Admitting: Orthopedic Surgery

## 2021-07-29 NOTE — Progress Notes (Signed)
Office Visit Note   Patient: Destiny Day           Date of Birth: 12-16-1950           MRN: [id number] Visit Date: 07/24/2021              Requested by: Cipriano Mile, [place],  [place] [address] PCP: Cipriano Mile, NP  Chief Complaint  Patient presents with   Left Foot - Follow-up    S/p TMA   Right Foot - Follow-up    S/p TMA      HPI: Patient is a 71 year old woman who presents for evaluation for both lower extremities.  She states the braces for her shoes are not functioning well and the orthotics are worn out she is status post bilateral transmetatarsal amputations she is end-stage renal disease on dialysis.  Assessment & Plan: Visit Diagnoses:  1. 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[place])   2. Status post transmetatarsal amputation of foot, left ([place])     Plan: Patient was given a new prescription for braces shoes orthotics and spacers bilaterally.  Follow-Up Instructions: Return if symptoms worsen or fail to improve.   Ortho Exam  Patient is alert, oriented, no adenopathy, well-dressed, normal affect, normal respiratory effort. Examination patient's feet are plantigrade bilaterally there are no open ulcers no cellulitis no swelling no drainage no signs of  Imaging: No results found. No images are attached to the encounter.  Labs: Lab Results  Component Value Date   HGBA1C 5.6 06/26/2019   HGBA1C 7.2 (H) 05/06/2018   HGBA1C 6.4 (H) 12/31/2017   CRP 3.2 (H) 05/22/2019   CRP 5.0 (H) 05/21/2019   CRP 6.9 (H) 05/20/2019   REPTSTATUS 05/22/2019 FINAL 05/17/2019   GRAMSTAIN  05/06/2018    MODERATE WBC PRESENT, PREDOMINANTLY PMN NO ORGANISMS SEEN    CULT  05/17/2019    NO GROWTH 5 DAYS Performed at [facility], [place] [address] [place], [place] [address]    LABORGA STAPHYLOCOCCUS AUREUS 05/06/2018     Lab Results  Component Value Date   ALBUMIN 2.9 (L) 01/06/2021   ALBUMIN 2.9 (L) 06/26/2019   ALBUMIN  2.6 (L) 05/22/2019    Lab Results  Component Value Date   MG 1.9 01/05/2021   MG 1.8 05/19/2019   MG 1.8 05/17/2019   No results found for: VD25OH  No results found for: PREALBUMIN CBC EXTENDED Latest Ref Rng & Units 03/13/2021 01/05/2021 09/05/2020  WBC 4.0 - 10.5 K/uL - 6.9 -  RBC 3.87 - 5.11 MIL/uL - 3.98 -  HGB 12.0 - 15.0 g/dL 13.3 12.1 12.9  HCT 36.0 - 46.0 % 39.0 39.2 38.0  PLT 150 - 400 K/uL - 268 -  NEUTROABS 1.4 - 7.0 x10E3/uL - - -  LYMPHSABS 0.7 - 3.1 x10E3/uL - - -     There is no height or weight on file to calculate BMI.  Orders:  No orders of the defined types were placed in this encounter.  No orders of the defined types were placed in this encounter.    Procedures: No procedures performed  Clinical Data: No additional findings.  ROS:  All other systems negative, except as noted in the HPI. Review of Systems  Objective: Vital Signs: There were no vitals taken for this visit.  Specialty Comments:  No specialty comments available.  PMFS History: Patient Active Problem List   Diagnosis Date Noted   Acute on chronic heart failure ([place])  01/05/2021   Macular pucker, right eye 10/29/2020   Nausea with vomiting, unspecified 09/10/2020   Pseudophakia of left eye 07/02/2020   Diabetic macular edema of right eye with proliferative retinopathy associated with type 2 diabetes mellitus ([place]) 07/02/2020   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 type 2 diabetes mellitus ([place]) 04/23/2020   Allergy, unspecified, initial encounter 04/04/2020   Anaphylactic shock, unspecified, initial encounter 04/04/2020   Secondary hypertension, unspecified 12/13/2019   Other disorders of phosphorus metabolism 08/24/2019   Paroxysmal atrial fibrillation ([place]) 06/13/2019   Hypocalcemia 04/17/2019   Old bucket handle tear of lateral meniscus of right knee    Old peripheral tear of medial meniscus of right knee    Staphylococcal  arthritis, unspecified joint ([place]) 05/11/2018   Septic arthritis of knee, right ([place]) 05/06/2018   Degenerative tear of posterior horn of medial meniscus, right    Old complex tear of lateral meniscus of right knee    Loose body in knee, right    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[place]) 02/03/2018   End-stage renal disease on hemodialysis ([place])    Respiratory failure ([place]) 01/21/2018   Achilles tendon contracture, right 11/09/2017   Labile blood glucose    Anemia of infection and chronic disease    Black stool    Right sided weakness    Subdural hemorrhage following injury 08/30/2017   Diabetic peripheral neuropathy (HCC)    Chronic obstructive pulmonary disease ([place])    Subdural hematoma 08/24/2017   Traumatic subdural hematoma 08/23/2017   Fall    SDH (subdural hematoma)    Acute respiratory failure with hypoxia (HCC)    Diabetes mellitus type 2 in nonobese ([place])    Leukocytosis    Labile blood pressure    Generalized anxiety disorder    Debility 08/16/2017   Amputated great toe of right foot ([place])    Amputated toe of left foot ([place])    Post-operative pain    ESRD on dialysis [facility])    Anxiety state    Acute blood loss anemia    Diarrhea, unspecified 01/16/2017   Dependence on renal dialysis ([place]) 01/06/2017   Idiopathic chronic venous hypertension of both lower extremities with inflammation 10/13/2016   S/P transmetatarsal amputation of foot, left ([place]) 09/21/2016   Onychomycosis 08/15/2016   Unspecified protein-calorie malnutrition ([place]) 07/29/2016   Other mechanical complication of femoral arterial graft (bypass), initial encounter ([place]) 07/14/2016   Other pneumonia, unspecified organism 07/11/2016   Age-related osteoporosis without current pathological fracture 07/11/2016   Cervical disc disorder, unspecified, unspecified cervical region 07/11/2016   Diverticulitis of intestine, part unspecified, without perforation or abscess without bleeding  07/11/2016   Unspecified abdominal hernia without obstruction or gangrene 07/11/2016   Gastro-esophageal reflux disease with esophagitis 07/11/2016   Primary generalized (osteo)arthritis 07/11/2016   Peripheral vascular disease ([place]) 07/08/2016   Cellulitis of left toe 06/29/2016   Atherosclerosis of native artery of left lower extremity with gangrene ([place]) 62/56/3893   Complication of vascular dialysis catheter 02/29/2016   Encounter for screening for other viral diseases 01/10/2016   Encounter for removal of sutures 10/29/2015   Other fluid overload 10/23/2015   Anemia in chronic kidney disease 10/16/2015   Essential (primary) hypertension 10/16/2015   Acute pulmonary edema ([place]) 10/16/2015   Coagulation defect, unspecified ([place]) 10/16/2015   Acute respiratory failure ([place]) 10/16/2015   Hyperlipidemia, unspecified 10/16/2015   Iron deficiency anemia, unspecified 10/16/2015   Pain, unspecified 10/16/2015   Pruritus,  unspecified 10/16/2015   Secondary hyperparathyroidism of renal origin ([place]) 10/16/2015   Shortness of breath 10/16/2015   GERD (gastroesophageal reflux disease) 10/07/2015   ARF (acute renal failure) ([place])    Hypothermia 06/12/2015   End stage renal disease ([name]) 06/12/2015   Unspecified diastolic (congestive) heart failure ([place]) 07/30/2014   CKD (chronic kidney disease) stage 4, GFR 15-29 ml/min (HCC) 06/27/2014   Hypertensive heart disease 05/25/2013   CKD (chronic kidney disease) stage 3, GFR 30-59 ml/min (HCC) 05/25/2013   Pulmonary edema 05/23/2013   Type 2 diabetes mellitus with diabetic chronic kidney disease ([place]) 05/23/2013   Type 2 diabetes mellitus with hyperosmolar nonketotic hyperglycemia ([place]) 04/13/2013   Acute on chronic diastolic heart failure ([place]) 04/13/2013   Gastrointestinal hemorrhage, unspecified 03/31/2013   Hypokalemia 08/24/2012   Hyperlipidemia associated with type 2 diabetes mellitus ([place]) 12/27/2007   Allergic rhinitis due to pollen  12/27/2007   Unspecified asthma, uncomplicated 42/70/6237   Past Medical History:  Diagnosis Date   Abdominal bruit    Anxiety    Arthritis    Osteoarthritis   Asthma    Cervical disc disease    "pinced nerve"   CHF (congestive heart failure) ([place])    Diabetes mellitus    Type II   Diverticulitis    ESRD (end stage renal disease) ([place])    dialysis - M/W/F- [place]   GERD (gastroesophageal reflux disease)    from medications   GI bleed 03/31/2013   Head injury 07/2017   History of hiatal hernia    Hyperlipidemia    Hypertension    Neuropathy    left leg   Nuclear sclerotic cataract of right eye 04/23/2020   Osteoporosis    Peripheral vascular disease ([place])    Pneumonia    "very young" and a few years ago   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 ([place])    Vitrectomy membrane peel endolaser 05-07-2021   Seasonal allergies    Shortness of breath dyspnea    WIth exertion, when fluid builds   Sleep apnea    can't afford cpap    Vitreomacular traction syndrome, 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   05-07-2021  Vitrectomy, membrane peel, PRP, resection posterior segment of NVD, remnants of peripheral NVE remain in place due to atrophic retina, no vitreous substitute    Family History  Problem Relation Age of Onset   Other Mother        not sure of cause of death   Diabetes Father    Diabetes Sister    Diabetes Sister    Pancreatic cancer Maternal Grandmother    Colon cancer Neg Hx     Past Surgical History:  Procedure Laterality Date   A/V SHUNTOGRAM N/A 09/22/2016   Procedure: A/V Shuntogram - left arm;  Surgeon: Serafina Mitchell, MD;  Location: [facility];  Service: Cardiovascular;  Laterality: N/A;   A/V SHUNTOGRAM N/A 03/22/2018   Procedure: A/V SHUNTOGRAM - left arm;  Surgeon: Serafina Mitchell, MD;  Location: [facility];  Service: Cardiovascular;  Laterality: N/A;   A/V SHUNTOGRAM Left 11/17/2018    Procedure: A/V SHUNTOGRAM;  Surgeon: Angelia Mould, MD;  Location: [facility];  Service: Cardiovascular;  Laterality: Left;   ABDOMINAL HYSTERECTOMY  1993`   AMPUTATION Left 09/01/2016   Procedure: LEFT FOOT TRANSMETATARSAL AMPUTATION;  Surgeon: Newt Minion, MD;  Location: [facility];  Service: Orthopedics;  Laterality: Left;   AMPUTATION Right 08/11/2017  Procedure: RIGHT GREAT TOE AMPUTATION DIGIT;  Surgeon: Rosetta Posner, MD;  Location: [facility];  Service: Vascular;  Laterality: Right;   AMPUTATION Right 12/31/2017   Procedure: RIGHT TRANSMETATARSAL AMPUTATION;  Surgeon: Newt Minion, MD;  Location: [facility];  Service: Orthopedics;  Laterality: Right;   AV FISTULA PLACEMENT Left 04/21/2016   Procedure: INSERTION OF ARTERIOVENOUS (AV) GORE-TEX GRAFT ARM LEFT;  Surgeon: Elam Dutch, MD;  Location: [facility];  Service: Vascular;  Laterality: Left;   BASCILIC VEIN TRANSPOSITION Left 07/10/2014   Procedure: BASCILIC VEIN TRANSPOSITION;  Surgeon: Angelia Mould, MD;  Location: [facility];  Service: Vascular;  Laterality: Left;   [place] Right 11/08/2014   Procedure: FIRST STAGE BASILIC VEIN TRANSPOSITION;  Surgeon: Angelia Mould, MD;  Location: [facility];  Service: Vascular;  Laterality: Right;   [place] Right 01/18/2015   Procedure: SECOND STAGE BASILIC VEIN TRANSPOSITION;  Surgeon: Angelia Mould, MD;  Location: [facility];  Service: Vascular;  Laterality: Right;   CRANIOTOMY N/A 08/23/2017   Procedure: CRANIOTOMY HEMATOMA EVACUATION SUBDURAL;  Surgeon: Ashok Pall, MD;  Location: [facility];  Service: Neurosurgery;  Laterality: N/A;   CRANIOTOMY Left 08/24/2017   Procedure: CRANIOTOMY FOR RECURRENT ACUTE SUBDURAL HEMATOMA;  Surgeon: Ashok Pall, MD;  Location: [facility];  Service: Neurosurgery;  Laterality: Left;   ESOPHAGOGASTRODUODENOSCOPY N/A 03/31/2013   Procedure: ESOPHAGOGASTRODUODENOSCOPY (EGD);  Surgeon: Gatha Mayer, MD;  Location: [facility]  ENDOSCOPY;  Service: Endoscopy;  Laterality: N/A;   EYE SURGERY     laser surgery   FEMORAL-POPLITEAL BYPASS GRAFT Left 07/08/2016   Procedure: LEFT  FEMORAL-BELOW KNEE POPLITEAL ARTERY BYPASS GRAFT USING 6MM X 80 CM PROPATEN GORETEX GRAFT WITH RINGS.;  Surgeon: Rosetta Posner, MD;  Location: [facility];  Service: Vascular;  Laterality: Left;   FEMORAL-POPLITEAL BYPASS GRAFT Right 08/11/2017   Procedure: RIGHT FEMORAL TO BELOW KNEE POPLITEAL ARTERKY  BYPASS GRAFT USING 6MM RINGED PROPATEN GRAFT;  Surgeon: Rosetta Posner, MD;  Location: [facility];  Service: Vascular;  Laterality: Right;   FISTULOGRAM Left 10/29/2014   Procedure: FISTULOGRAM;  Surgeon: Angelia Mould, MD;  Location: [facility] LAB;  Service: Cardiovascular;  Laterality: Left;   GAS/FLUID EXCHANGE Left 12/20/2018   Procedure: AIR GAS EXCHANGE;  Surgeon: Jalene Mullet, MD;  Location: [facility];  Service: Ophthalmology;  Laterality: Left;   INSERTION OF DIALYSIS CATHETER N/A 09/05/2020   Procedure: INSERTION OF DIALYSIS CATHETER;  Surgeon: Cherre Robins, MD;  Location: [facility];  Service: Vascular;  Laterality: N/A;   INSERTION OF DIALYSIS CATHETER Left 03/13/2021   Procedure: INSERTION OF DIALYSIS CATHETER;  Surgeon: Waynetta Sandy, MD;  Location: [facility];  Service: Vascular;  Laterality: Left;   KNEE ARTHROSCOPY Right 05/06/2018   Procedure: RIGHT KNEE ARTHROSCOPY;  Surgeon: Newt Minion, MD;  Location: [facility];  Service: Orthopedics;  Laterality: Right;   KNEE ARTHROSCOPY Right 06/10/2018   Procedure: RIGHT KNEE ARTHROSCOPY AND DEBRIDEMENT;  Surgeon: Newt Minion, MD;  Location: [facility];  Service: Orthopedics;  Laterality: Right;   LIGATION OF ARTERIOVENOUS  FISTULA Left 04/21/2016   Procedure: LIGATION OF ARTERIOVENOUS  FISTULA LEFT ARM;  Surgeon: Elam Dutch, MD;  Location: [facility];  Service: Vascular;  Laterality: Left;   LOWER EXTREMITY ANGIOGRAPHY N/A 04/06/2017   Procedure: Lower Extremity Angiography - Right;  Surgeon: Serafina Mitchell, MD;  Location: [facility];  Service: Cardiovascular;  Laterality: N/A;   MEMBRANE PEEL Left 12/20/2018  Procedure: MEMBRANE PEEL;  Surgeon: Jalene Mullet, MD;  Location: [facility];  Service: Ophthalmology;  Laterality: Left;   PATCH ANGIOPLASTY Right 01/18/2015   Procedure: BASILIC VEIN PATCH ANGIOPLASTY USING VASCUGUARD PATCH;  Surgeon: Angelia Mould, MD;  Location: [facility];  Service: Vascular;  Laterality: Right;   PERIPHERAL VASCULAR BALLOON ANGIOPLASTY  09/22/2016   Procedure: Peripheral Vascular Balloon Angioplasty;  Surgeon: Serafina Mitchell, MD;  Location: [facility];  Service: Cardiovascular;;  Lt. Fistula   PERIPHERAL VASCULAR BALLOON ANGIOPLASTY Left 03/22/2018   Procedure: PERIPHERAL VASCULAR BALLOON ANGIOPLASTY;  Surgeon: Serafina Mitchell, MD;  Location: [facility];  Service: Cardiovascular;  Laterality: Left;  Arm shunt   PERIPHERAL VASCULAR CATHETERIZATION N/A 06/23/2016   Procedure: Abdominal Aortogram w/Lower Extremity;  Surgeon: Serafina Mitchell, MD;  Location: [facility];  Service: Cardiovascular;  Laterality: N/A;   PERIPHERAL VASCULAR CATHETERIZATION  06/23/2016   Procedure: Peripheral Vascular Intervention;  Surgeon: Serafina Mitchell, MD;  Location: [facility];  Service: Cardiovascular;;  lt common and external illiac artery   PHOTOCOAGULATION WITH LASER Left 12/20/2018   Procedure: PHOTOCOAGULATION WITH LASER;  Surgeon: Jalene Mullet, MD;  Location: [facility];  Service: Ophthalmology;  Laterality: Left;   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  Tobacco Use   Smoking status: Former    Packs/day: 0.35    Years: 40.00    Pack years: 14.00    Types: Cigarettes    Quit date: 05/10/2012    Years since quitting: 9.2   Smokeless tobacco: Never  Vaping Use   Vaping Use: Never used  Substance and Sexual Activity   Alcohol use: No   Drug use: No    Comment: marijuana; quit in early 1980's   Sexual activity: Yes

## 2021-07-29 NOTE — Telephone Encounter (Signed)
[facility] calling to verify what pt is exactly needing. The best call back number is [phone] and the rep working with the pt is [name].

## 2021-07-30 DIAGNOSIS — N2581 Secondary hyperparathyroidism of renal origin: Secondary | ICD-10-CM | POA: Diagnosis not present

## 2021-07-30 DIAGNOSIS — T782XXA Anaphylactic shock, unspecified, initial encounter: Secondary | ICD-10-CM | POA: Diagnosis not present

## 2021-07-30 DIAGNOSIS — Z992 Dependence on renal dialysis: Secondary | ICD-10-CM | POA: Diagnosis not present

## 2021-07-30 DIAGNOSIS — D509 Iron deficiency anemia, unspecified: Secondary | ICD-10-CM | POA: Diagnosis not present

## 2021-07-30 DIAGNOSIS — D631 Anemia in chronic kidney disease: Secondary | ICD-10-CM | POA: Diagnosis not present

## 2021-07-30 DIAGNOSIS — N186 End stage renal disease: Secondary | ICD-10-CM | POA: Diagnosis not present

## 2021-07-30 NOTE — Telephone Encounter (Signed)
I called number provided and left message to return call.

## 2021-07-30 NOTE — Telephone Encounter (Signed)
[name] from [facility] called back.

## 2021-08-01 DIAGNOSIS — N186 End stage renal disease: Secondary | ICD-10-CM | POA: Diagnosis not present

## 2021-08-01 DIAGNOSIS — N2581 Secondary hyperparathyroidism of renal origin: Secondary | ICD-10-CM | POA: Diagnosis not present

## 2021-08-01 DIAGNOSIS — Z992 Dependence on renal dialysis: Secondary | ICD-10-CM | POA: Diagnosis not present

## 2021-08-01 DIAGNOSIS — D509 Iron deficiency anemia, unspecified: Secondary | ICD-10-CM | POA: Diagnosis not present

## 2021-08-01 DIAGNOSIS — D631 Anemia in chronic kidney disease: Secondary | ICD-10-CM | POA: Diagnosis not present

## 2021-08-01 DIAGNOSIS — T782XXA Anaphylactic shock, unspecified, initial encounter: Secondary | ICD-10-CM | POA: Diagnosis not present

## 2021-08-03 ENCOUNTER — Emergency Department (HOSPITAL_COMMUNITY): Payer: Medicare Other

## 2021-08-03 ENCOUNTER — Observation Stay (HOSPITAL_COMMUNITY)
Admission: EM | Admit: 2021-08-03 | Discharge: 2021-08-04 | Disposition: A | Discharge status: Home / Self Care | Payer: Medicare Other | Attending: Internal Medicine | Admitting: Internal Medicine

## 2021-08-03 DIAGNOSIS — Z7982 Long term (current) use of aspirin: Secondary | ICD-10-CM | POA: Diagnosis not present

## 2021-08-03 DIAGNOSIS — R079 Chest pain, unspecified: Secondary | ICD-10-CM | POA: Diagnosis not present

## 2021-08-03 DIAGNOSIS — J449 Chronic obstructive pulmonary disease, unspecified: Secondary | ICD-10-CM | POA: Diagnosis not present

## 2021-08-03 DIAGNOSIS — N186 End stage renal disease: Secondary | ICD-10-CM | POA: Diagnosis not present

## 2021-08-03 DIAGNOSIS — R197 Diarrhea, unspecified: Secondary | ICD-10-CM | POA: Diagnosis not present

## 2021-08-03 DIAGNOSIS — D631 Anemia in chronic kidney disease: Secondary | ICD-10-CM | POA: Insufficient documentation

## 2021-08-03 DIAGNOSIS — R0602 Shortness of breath: Secondary | ICD-10-CM | POA: Diagnosis not present

## 2021-08-03 DIAGNOSIS — E8770 Fluid overload, unspecified: Principal | ICD-10-CM | POA: Diagnosis present

## 2021-08-03 DIAGNOSIS — E1142 Type 2 diabetes mellitus with diabetic polyneuropathy: Secondary | ICD-10-CM | POA: Insufficient documentation

## 2021-08-03 DIAGNOSIS — I5022 Chronic systolic (congestive) heart failure: Secondary | ICD-10-CM | POA: Diagnosis not present

## 2021-08-03 DIAGNOSIS — J810 Acute pulmonary edema: Secondary | ICD-10-CM | POA: Diagnosis not present

## 2021-08-03 DIAGNOSIS — E8779 Other fluid overload: Secondary | ICD-10-CM | POA: Diagnosis not present

## 2021-08-03 DIAGNOSIS — Z7984 Long term (current) use of oral hypoglycemic drugs: Secondary | ICD-10-CM | POA: Insufficient documentation

## 2021-08-03 DIAGNOSIS — J80 Acute respiratory distress syndrome: Secondary | ICD-10-CM | POA: Diagnosis not present

## 2021-08-03 DIAGNOSIS — Z87891 Personal history of nicotine dependence: Secondary | ICD-10-CM | POA: Diagnosis not present

## 2021-08-03 DIAGNOSIS — J45909 Unspecified asthma, uncomplicated: Secondary | ICD-10-CM | POA: Insufficient documentation

## 2021-08-03 DIAGNOSIS — N2581 Secondary hyperparathyroidism of renal origin: Secondary | ICD-10-CM | POA: Diagnosis not present

## 2021-08-03 DIAGNOSIS — Z20822 Contact with and (suspected) exposure to covid-19: Secondary | ICD-10-CM | POA: Insufficient documentation

## 2021-08-03 DIAGNOSIS — I5033 Acute on chronic diastolic (congestive) heart failure: Secondary | ICD-10-CM | POA: Diagnosis not present

## 2021-08-03 DIAGNOSIS — Z79899 Other long term (current) drug therapy: Secondary | ICD-10-CM | POA: Insufficient documentation

## 2021-08-03 DIAGNOSIS — R609 Edema, unspecified: Secondary | ICD-10-CM | POA: Diagnosis not present

## 2021-08-03 DIAGNOSIS — I160 Hypertensive urgency: Secondary | ICD-10-CM | POA: Diagnosis present

## 2021-08-03 DIAGNOSIS — I5032 Chronic diastolic (congestive) heart failure: Secondary | ICD-10-CM | POA: Diagnosis not present

## 2021-08-03 DIAGNOSIS — E1122 Type 2 diabetes mellitus with diabetic chronic kidney disease: Secondary | ICD-10-CM | POA: Diagnosis not present

## 2021-08-03 DIAGNOSIS — R0789 Other chest pain: Secondary | ICD-10-CM | POA: Diagnosis not present

## 2021-08-03 DIAGNOSIS — Z743 Need for continuous supervision: Secondary | ICD-10-CM | POA: Diagnosis not present

## 2021-08-03 DIAGNOSIS — I5030 Unspecified diastolic (congestive) heart failure: Secondary | ICD-10-CM | POA: Diagnosis not present

## 2021-08-03 DIAGNOSIS — E876 Hypokalemia: Secondary | ICD-10-CM | POA: Diagnosis not present

## 2021-08-03 DIAGNOSIS — Z992 Dependence on renal dialysis: Secondary | ICD-10-CM | POA: Diagnosis not present

## 2021-08-03 DIAGNOSIS — R9431 Abnormal electrocardiogram [ECG] [EKG]: Secondary | ICD-10-CM | POA: Diagnosis present

## 2021-08-03 DIAGNOSIS — I132 Hypertensive heart and chronic kidney disease with heart failure and with stage 5 chronic kidney disease, or end stage renal disease: Secondary | ICD-10-CM | POA: Diagnosis not present

## 2021-08-03 DIAGNOSIS — I10 Essential (primary) hypertension: Secondary | ICD-10-CM | POA: Diagnosis not present

## 2021-08-03 LAB — RESP PANEL BY RT-PCR (FLU A&B, COVID) ARPGX2
Influenza A by PCR: NEGATIVE
Influenza B by PCR: NEGATIVE
SARS Coronavirus 2 by RT PCR: NEGATIVE

## 2021-08-03 LAB — BASIC METABOLIC PANEL
Anion gap: 13 (ref 5–15)
BUN: 18 mg/dL (ref 8–23)
CO2: 27 mmol/L (ref 22–32)
Calcium: 9.5 mg/dL (ref 8.9–10.3)
Chloride: 92 mmol/L — ABNORMAL LOW (ref 98–111)
Creatinine, Ser: 5.31 mg/dL — ABNORMAL HIGH (ref 0.44–1.00)
GFR, Estimated: 8 mL/min — ABNORMAL LOW (ref >60)
Glucose, Bld: 137 mg/dL — ABNORMAL HIGH (ref 70–99)
Potassium: 2.9 mmol/L — ABNORMAL LOW (ref 3.5–5.1)
Sodium: 132 mmol/L — ABNORMAL LOW (ref 135–145)

## 2021-08-03 LAB — GLUCOSE, CAPILLARY: Glucose-Capillary: 229 mg/dL — ABNORMAL HIGH (ref 70–99)

## 2021-08-03 MED ORDER — AMLODIPINE BESYLATE 5 MG PO TABS
10.0000 mg | ORAL_TABLET | Freq: Once | ORAL | Status: AC
Start: 2021-08-03 — End: 2021-08-03
  Administered 2021-08-03: 10 mg via ORAL
  Filled 2021-08-03: qty 2

## 2021-08-03 MED ORDER — ISOSORB DINITRATE-HYDRALAZINE 20-37.5 MG PO TABS
1.0000 | ORAL_TABLET | Freq: Once | ORAL | Status: AC
  Administered 2021-08-03: 1 via ORAL
  Filled 2021-08-03: qty 1

## 2021-08-03 MED ORDER — ISOSORB DINITRATE-HYDRALAZINE 20-37.5 MG PO TABS: 1.0000 | ORAL_TABLET | ORAL | Status: DC

## 2021-08-03 MED ORDER — CARVEDILOL 12.5 MG PO TABS
12.5000 mg | ORAL_TABLET | Freq: Once | ORAL | Status: AC
  Administered 2021-08-03: 12.5 mg via ORAL
  Filled 2021-08-03: qty 1

## 2021-08-03 MED ORDER — MOMETASONE FURO-FORMOTEROL FUM 200-5 MCG/ACT IN AERO
2.0000 | INHALATION_SPRAY | Freq: Two times a day (BID) | RESPIRATORY_TRACT | Status: DC
  Administered 2021-08-03 – 2021-08-04 (×2): 2 via RESPIRATORY_TRACT
  Filled 2021-08-03: qty 8.8

## 2021-08-03 MED ORDER — CINACALCET HCL 30 MG PO TABS: 90.0000 mg | ORAL_TABLET | ORAL | Status: DC

## 2021-08-03 MED ORDER — ASPIRIN EC 81 MG PO TBEC
81.0000 mg | DELAYED_RELEASE_TABLET | Freq: Every day | ORAL | Status: DC
  Administered 2021-08-03 – 2021-08-04 (×2): 81 mg via ORAL
  Filled 2021-08-03 (×3): qty 1

## 2021-08-03 MED ORDER — CHLORHEXIDINE GLUCONATE CLOTH 2 % EX PADS
6.0000 | MEDICATED_PAD | Freq: Every day | CUTANEOUS | Status: DC
  Administered 2021-08-04: 6 via TOPICAL

## 2021-08-03 MED ORDER — HEPARIN SODIUM (PORCINE) 5000 UNIT/ML IJ SOLN
5000.0000 [IU] | Freq: Three times a day (TID) | INTRAMUSCULAR | Status: DC
  Administered 2021-08-03 – 2021-08-04 (×3): 5000 [IU] via SUBCUTANEOUS
  Filled 2021-08-03 (×3): qty 1

## 2021-08-03 MED ORDER — ISOSORB DINITRATE-HYDRALAZINE 20-37.5 MG PO TABS
1.0000 | ORAL_TABLET | ORAL | Status: DC
  Administered 2021-08-03: 1 via ORAL
  Filled 2021-08-03: qty 1

## 2021-08-03 MED ORDER — CARVEDILOL 12.5 MG PO TABS: 12.5000 mg | ORAL_TABLET | ORAL | Status: DC

## 2021-08-03 MED ORDER — FERRIC CITRATE 1 GM 210 MG(FE) PO TABS: 210.0000 mg | ORAL_TABLET | ORAL | Status: DC

## 2021-08-03 MED ORDER — NITROGLYCERIN IN D5W 200-5 MCG/ML-% IV SOLN
0.0000 ug/min | INTRAVENOUS | Status: DC
  Administered 2021-08-03: 5 ug/min via INTRAVENOUS
  Filled 2021-08-03: qty 250

## 2021-08-03 MED ORDER — CINACALCET HCL 30 MG PO TABS: 90.0000 mg | ORAL_TABLET | ORAL | Status: DC | PRN

## 2021-08-03 MED ORDER — FLUTICASONE PROPIONATE 50 MCG/ACT NA SUSP
2.0000 | Freq: Every day | NASAL | Status: DC | PRN
  Filled 2021-08-03: qty 16

## 2021-08-03 MED ORDER — SODIUM CHLORIDE 0.9% FLUSH
3.0000 mL | Freq: Two times a day (BID) | INTRAVENOUS | Status: DC
  Administered 2021-08-03 – 2021-08-04 (×3): 3 mL via INTRAVENOUS

## 2021-08-03 MED ORDER — ACETAMINOPHEN 325 MG PO TABS: 650.0000 mg | ORAL_TABLET | Freq: Four times a day (QID) | ORAL | Status: DC | PRN

## 2021-08-03 MED ORDER — MONTELUKAST SODIUM 10 MG PO TABS
10.0000 mg | ORAL_TABLET | Freq: Every day | ORAL | Status: DC
  Administered 2021-08-03: 10 mg via ORAL
  Filled 2021-08-03: qty 1

## 2021-08-03 MED ORDER — PANTOPRAZOLE SODIUM 40 MG PO TBEC: 40.0000 mg | DELAYED_RELEASE_TABLET | Freq: Every day | ORAL | Status: DC

## 2021-08-03 MED ORDER — IPRATROPIUM-ALBUTEROL 0.5-2.5 (3) MG/3ML IN SOLN
3.0000 mL | Freq: Once | RESPIRATORY_TRACT | Status: AC
Start: 2021-08-03 — End: 2021-08-03
  Administered 2021-08-03: 3 mL via RESPIRATORY_TRACT
  Filled 2021-08-03: qty 3

## 2021-08-03 MED ORDER — FERRIC CITRATE 1 GM 210 MG(FE) PO TABS: 210.0000 mg | ORAL_TABLET | ORAL | Status: DC | PRN

## 2021-08-03 MED ORDER — POLYETHYLENE GLYCOL 3350 17 G PO PACK: 17.0000 g | PACK | Freq: Every day | ORAL | Status: DC | PRN

## 2021-08-03 MED ORDER — TRIMETHOBENZAMIDE HCL 300 MG PO CAPS
300.0000 mg | ORAL_CAPSULE | Freq: Three times a day (TID) | ORAL | Status: DC | PRN
  Administered 2021-08-03: 300 mg via ORAL
  Filled 2021-08-03 (×2): qty 1

## 2021-08-03 MED ORDER — BUSPIRONE HCL 5 MG PO TABS
7.5000 mg | ORAL_TABLET | Freq: Two times a day (BID) | ORAL | Status: DC | PRN
  Administered 2021-08-03: 7.5 mg via ORAL
  Filled 2021-08-03 (×2): qty 1

## 2021-08-03 MED ORDER — CARVEDILOL 12.5 MG PO TABS
12.5000 mg | ORAL_TABLET | ORAL | Status: DC
Start: 2021-08-04 — End: 2021-08-03

## 2021-08-03 MED ORDER — CINACALCET HCL 30 MG PO TABS
90.0000 mg | ORAL_TABLET | ORAL | Status: DC
  Administered 2021-08-03: 90 mg via ORAL
  Filled 2021-08-03: qty 3

## 2021-08-03 MED ORDER — FERRIC CITRATE 1 GM 210 MG(FE) PO TABS
420.0000 mg | ORAL_TABLET | Freq: Three times a day (TID) | ORAL | Status: DC
  Administered 2021-08-04 (×2): 420 mg via ORAL
  Filled 2021-08-03 (×2): qty 2

## 2021-08-03 MED ORDER — ACETAMINOPHEN 650 MG RE SUPP: 650.0000 mg | Freq: Four times a day (QID) | RECTAL | Status: DC | PRN

## 2021-08-03 MED ORDER — LIDOCAINE-PRILOCAINE 2.5-2.5 % EX CREA
1.0000 "application " | TOPICAL_CREAM | CUTANEOUS | Status: DC
  Filled 2021-08-03: qty 5

## 2021-08-03 MED ORDER — AMLODIPINE BESYLATE 10 MG PO TABS: 10.0000 mg | ORAL_TABLET | Freq: Every day | ORAL | Status: DC

## 2021-08-03 MED ORDER — ALBUTEROL SULFATE (2.5 MG/3ML) 0.083% IN NEBU: 2.5000 mg | INHALATION_SOLUTION | Freq: Four times a day (QID) | RESPIRATORY_TRACT | Status: DC | PRN

## 2021-08-03 MED ORDER — ATORVASTATIN CALCIUM 80 MG PO TABS
80.0000 mg | ORAL_TABLET | Freq: Every day | ORAL | Status: DC
  Administered 2021-08-03: 80 mg via ORAL
  Filled 2021-08-03: qty 1

## 2021-08-03 MED ORDER — GABAPENTIN 100 MG PO CAPS
100.0000 mg | ORAL_CAPSULE | Freq: Every evening | ORAL | Status: DC | PRN
  Filled 2021-08-03: qty 1

## 2021-08-03 MED ORDER — INSULIN ASPART 100 UNIT/ML IJ SOLN: 0.0000 [IU] | Freq: Three times a day (TID) | INTRAMUSCULAR | Status: DC

## 2021-08-03 MED ORDER — GUAIFENESIN-CODEINE 100-10 MG/5ML PO SOLN: 5.0000 mL | Freq: Three times a day (TID) | ORAL | Status: DC | PRN

## 2021-08-03 NOTE — ED Notes (Addendum)
Phone Report given to Steffanie Dunn, RN of [phone]

## 2021-08-03 NOTE — H&P (Signed)
History and Physical    Destiny Day:[id number] DOB: [birth year] DOA: 08/03/2021  Referring MD/NP/PA: Nanda Quinton, MD PCP: Cipriano Mile, NP  Patient coming from: Home(Lives with husband) via EMS  Chief Complaint: Shortness of breath  I have personally briefly reviewed patient's old medical records in [place]   HPI: Destiny Day is a 71 y.o. female with medical history significant of hypertension, hyperlipidemia, HFpEF, diabetes mellitus type 2, asthma, and ESRD on HD(M/W/F) presents with complaints of shortness of breath.  Symptoms woke her out of her sleep this morning around 2 AM.  She went to the bathroom and came back and sat on the edge of the bed without improvement.  Reports that she chronically has a intermittently productive cough with clear sputum and states that she has been wheezing.  Denies having any significant chest pain, leg swelling, fever, chills, nausea, vomiting, or diarrhea.  She has not missed any dialysis sessions, but admits that he yesterday she may have drank more than the 32 ounces of fluid.  She tries to abide by dietary restrictions.  Ms. Shuping also brings up that she is on a fixed income and had not been able to afford inhalers of Advair as it was $160.  She had been prescribed Symbicort but also notes that that still was about $60 and difficult to buy on a fixed income.  ED Course:  Upon admission into the emergency department patient was noted to be afebrile, pulse 82 107, respirations 13-30, blood pressures elevated up to 226/112, and O2 saturation currently maintained on room air.  Labs were a BMP only which noted sodium 132, potassium 2.9, chloride 92, BUN 18, and creatinine 5.31.  Chest x-ray noted enlarged pericardial silhouette with vascular congestion and diffuse interstitial opacities suggesting edema.  Nephrology have been consulted for need of dialysis.  Influenza and COVID-19 screening were negative.  Patient had been given home  medications  Review of Systems  Constitutional:  Positive for malaise/fatigue. Negative for fever.  Eyes:  Negative for photophobia and pain.  Respiratory:  Positive for cough, shortness of breath and wheezing.   Cardiovascular:  Positive for orthopnea. Negative for chest pain and leg swelling.  Gastrointestinal:  Negative for abdominal pain, nausea and vomiting.  Musculoskeletal:  Negative for falls.  Skin:  Negative for rash.  Neurological:  Negative for focal weakness and loss of consciousness.  Psychiatric/Behavioral:  Negative for substance abuse. The patient has insomnia.    Past Medical History:  Diagnosis Date   Abdominal bruit    Anxiety    Arthritis    Osteoarthritis   Asthma    Cervical disc disease    "pinced nerve"   CHF (congestive heart failure) ([place])    Diabetes mellitus    Type II   Diverticulitis    ESRD (end stage renal disease) ([place])    dialysis - M/W/F- [place]   GERD (gastroesophageal reflux disease)    from medications   GI bleed 03/31/2013   Head injury 07/2017   History of hiatal hernia    Hyperlipidemia    Hypertension    Neuropathy    left leg   Nuclear sclerotic cataract of right eye 04/23/2020   Osteoporosis    Peripheral vascular disease ([place])    Pneumonia    "very young" and a few years ago   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)    Vitrectomy membrane peel endolaser 05-07-2021   Seasonal  allergies    Shortness of breath dyspnea    WIth exertion, when fluid builds   Sleep apnea    can't afford cpap    Vitreomacular traction syndrome, 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   05-07-2021  Vitrectomy, membrane peel, PRP, resection posterior segment of NVD, remnants of peripheral NVE remain in place due to atrophic retina, no vitreous substitute    Past Surgical History:  Procedure Laterality Date   A/V SHUNTOGRAM N/A 09/22/2016   Procedure: A/V Shuntogram - left arm;  Surgeon: Serafina Mitchell, MD;  Location: [facility];  Service: Cardiovascular;  Laterality: N/A;   A/V SHUNTOGRAM N/A 03/22/2018   Procedure: A/V SHUNTOGRAM - left arm;  Surgeon: Serafina Mitchell, MD;  Location: [facility];  Service: Cardiovascular;  Laterality: N/A;   A/V SHUNTOGRAM Left 11/17/2018   Procedure: A/V SHUNTOGRAM;  Surgeon: Angelia Mould, MD;  Location: [facility];  Service: Cardiovascular;  Laterality: Left;   ABDOMINAL HYSTERECTOMY  1993`   AMPUTATION Left 09/01/2016   Procedure: LEFT FOOT TRANSMETATARSAL AMPUTATION;  Surgeon: Newt Minion, MD;  Location: [facility];  Service: Orthopedics;  Laterality: Left;   AMPUTATION Right 08/11/2017   Procedure: RIGHT GREAT TOE AMPUTATION DIGIT;  Surgeon: Rosetta Posner, MD;  Location: [facility];  Service: Vascular;  Laterality: Right;   AMPUTATION Right 12/31/2017   Procedure: RIGHT TRANSMETATARSAL AMPUTATION;  Surgeon: Newt Minion, MD;  Location: [facility];  Service: Orthopedics;  Laterality: Right;   AV FISTULA PLACEMENT Left 04/21/2016   Procedure: INSERTION OF ARTERIOVENOUS (AV) GORE-TEX GRAFT ARM LEFT;  Surgeon: Elam Dutch, MD;  Location: [facility];  Service: Vascular;  Laterality: Left;   BASCILIC VEIN TRANSPOSITION Left 07/10/2014   Procedure: BASCILIC VEIN TRANSPOSITION;  Surgeon: Angelia Mould, MD;  Location: [facility];  Service: Vascular;  Laterality: Left;   [place] Right 11/08/2014   Procedure: FIRST STAGE BASILIC VEIN TRANSPOSITION;  Surgeon: Angelia Mould, MD;  Location: [facility];  Service: Vascular;  Laterality: Right;   [place] Right 01/18/2015   Procedure: SECOND STAGE BASILIC VEIN TRANSPOSITION;  Surgeon: Angelia Mould, MD;  Location: [facility];  Service: Vascular;  Laterality: Right;   CRANIOTOMY N/A 08/23/2017   Procedure: CRANIOTOMY HEMATOMA EVACUATION SUBDURAL;  Surgeon: Ashok Pall, MD;  Location: [facility];  Service: Neurosurgery;  Laterality: N/A;   CRANIOTOMY Left  08/24/2017   Procedure: CRANIOTOMY FOR RECURRENT ACUTE SUBDURAL HEMATOMA;  Surgeon: Ashok Pall, MD;  Location: [facility];  Service: Neurosurgery;  Laterality: Left;   ESOPHAGOGASTRODUODENOSCOPY N/A 03/31/2013   Procedure: ESOPHAGOGASTRODUODENOSCOPY (EGD);  Surgeon: Gatha Mayer, MD;  Location: [facility];  Service: Endoscopy;  Laterality: N/A;   EYE SURGERY     laser surgery   FEMORAL-POPLITEAL BYPASS GRAFT Left 07/08/2016   Procedure: LEFT  FEMORAL-BELOW KNEE POPLITEAL ARTERY BYPASS GRAFT USING 6MM X 80 CM PROPATEN GORETEX GRAFT WITH RINGS.;  Surgeon: Rosetta Posner, MD;  Location: [facility] OR;  Service: Vascular;  Laterality: Left;   FEMORAL-POPLITEAL BYPASS GRAFT Right 08/11/2017   Procedure: RIGHT FEMORAL TO BELOW KNEE POPLITEAL ARTERKY  BYPASS GRAFT USING 6MM RINGED PROPATEN GRAFT;  Surgeon: Rosetta Posner, MD;  Location: [facility];  Service: Vascular;  Laterality: Right;   FISTULOGRAM Left 10/29/2014   Procedure: FISTULOGRAM;  Surgeon: Angelia Mould, MD;  Location: [facility];  Service: Cardiovascular;  Laterality: Left;   GAS/FLUID EXCHANGE Left 12/20/2018   Procedure: AIR GAS EXCHANGE;  Surgeon: Jalene Mullet,  MD;  Location: [facility];  Service: Ophthalmology;  Laterality: Left;   INSERTION OF DIALYSIS CATHETER N/A 09/05/2020   Procedure: INSERTION OF DIALYSIS CATHETER;  Surgeon: Cherre Robins, MD;  Location: [facility];  Service: Vascular;  Laterality: N/A;   INSERTION OF DIALYSIS CATHETER Left 03/13/2021   Procedure: INSERTION OF DIALYSIS CATHETER;  Surgeon: Waynetta Sandy, MD;  Location: [facility];  Service: Vascular;  Laterality: Left;   KNEE ARTHROSCOPY Right 05/06/2018   Procedure: RIGHT KNEE ARTHROSCOPY;  Surgeon: Newt Minion, MD;  Location: [facility];  Service: Orthopedics;  Laterality: Right;   KNEE ARTHROSCOPY Right 06/10/2018   Procedure: RIGHT KNEE ARTHROSCOPY AND DEBRIDEMENT;  Surgeon: Newt Minion, MD;  Location: [facility];  Service: Orthopedics;  Laterality: Right;   LIGATION OF  ARTERIOVENOUS  FISTULA Left 04/21/2016   Procedure: LIGATION OF ARTERIOVENOUS  FISTULA LEFT ARM;  Surgeon: Elam Dutch, MD;  Location: [facility];  Service: Vascular;  Laterality: Left;   LOWER EXTREMITY ANGIOGRAPHY N/A 04/06/2017   Procedure: Lower Extremity Angiography - Right;  Surgeon: Serafina Mitchell, MD;  Location: [facility];  Service: Cardiovascular;  Laterality: N/A;   MEMBRANE PEEL Left 12/20/2018   Procedure: MEMBRANE PEEL;  Surgeon: Jalene Mullet, MD;  Location: [facility];  Service: Ophthalmology;  Laterality: Left;   PATCH ANGIOPLASTY Right 01/18/2015   Procedure: BASILIC VEIN PATCH ANGIOPLASTY USING VASCUGUARD PATCH;  Surgeon: Angelia Mould, MD;  Location: [employer];  Service: Vascular;  Laterality: Right;   PERIPHERAL VASCULAR BALLOON ANGIOPLASTY  09/22/2016   Procedure: Peripheral Vascular Balloon Angioplasty;  Surgeon: Serafina Mitchell, MD;  Location: [facility];  Service: Cardiovascular;;  Lt. Fistula   PERIPHERAL VASCULAR BALLOON ANGIOPLASTY Left 03/22/2018   Procedure: PERIPHERAL VASCULAR BALLOON ANGIOPLASTY;  Surgeon: Serafina Mitchell, MD;  Location: [facility];  Service: Cardiovascular;  Laterality: Left;  Arm shunt   PERIPHERAL VASCULAR CATHETERIZATION N/A 06/23/2016   Procedure: Abdominal Aortogram w/Lower Extremity;  Surgeon: Serafina Mitchell, MD;  Location: [facility];  Service: Cardiovascular;  Laterality: N/A;   PERIPHERAL VASCULAR CATHETERIZATION  06/23/2016   Procedure: Peripheral Vascular Intervention;  Surgeon: Serafina Mitchell, MD;  Location: [facility];  Service: Cardiovascular;;  lt common and external illiac artery   PHOTOCOAGULATION WITH LASER Left 12/20/2018   Procedure: PHOTOCOAGULATION WITH LASER;  Surgeon: Jalene Mullet, MD;  Location: [facility];  Service: Ophthalmology;  Laterality: Left;   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   Procedure: REPAIR OF COMPLEX TRACTION RETINAL DETACHMENT;  Surgeon: Jalene Mullet, MD;  Location: [place];  Service: Ophthalmology;  Laterality: Left;   REVISION OF ARTERIOVENOUS GORETEX GRAFT Left 09/05/2020   Procedure: REVISION OF ARTERIOVENOUS GORETEX GRAFT LEFT;  Surgeon: Cherre Robins, MD;  Location: [facility];  Service: Vascular;  Laterality: Left;     reports that she quit smoking about 9 years ago. Her smoking use included cigarettes. She has a 14.00 pack-year smoking history. She has never used smokeless tobacco. She reports that she does not drink alcohol and does not use drugs.  Allergies  Allergen Reactions   Adhesive  [Tape] Hives   Lisinopril Cough   Prednisone Swelling and Other (See Comments)    Excessive fluid buildup    Family History  Problem Relation Age of Onset   Other Mother        not sure of cause of death   Diabetes Father    Diabetes  Sister    Diabetes Sister    Pancreatic cancer Maternal Grandmother    Colon cancer Neg Hx     Prior to Admission medications   Medication Sig Start Date End Date Taking? Authorizing Provider  albuterol (PROAIR HFA) 108 (90 Base) MCG/ACT inhaler Inhale 2 puffs into the lungs every 4 (four) hours as needed for wheezing or shortness of breath. 01/07/21  Yes Valentina Shaggy, MD  amLODipine (NORVASC) 10 MG tablet Take 10 mg by mouth at bedtime.   Yes [provider]  aspirin EC 81 MG tablet Take 81 mg by mouth daily.   Yes [provider]  atorvastatin (LIPITOR) 80 MG tablet Take 80 mg by mouth at bedtime. 07/17/21  Yes [provider]  busPIRone (BUSPAR) 7.5 MG tablet Take 7.5 mg by mouth 2 (two) times daily as needed (anxiety). 10/29/19  Yes [provider]  carvedilol (COREG) 12.5 MG tablet Take 12.5 mg by mouth See admin instructions. Take 1 tablet (12.5 mg) by mouth once daily in the evening on Mondays, Wednesdays, & Fridays. (Dialysis) Take 1 tablet (12.5 mg) by mouth twice daily on Sundays, Tuesdays, Thursdays, & Saturdays. (Non Dialysis)   Yes [provider]  cinacalcet (SENSIPAR) 90 MG tablet Take 90 mg by mouth See admin instructions. Take 1 tablet (90 mg) by mouth in the evening on Mondays, Wednesdays, & Fridays (dialysis) Take 1 tablet (90 mg) by mouth twice daily on Sundays, Tuesdays, Thursdays, Saturdays. (non dialysis) 11/28/18  Yes [provider]  EPINEPHrine (AUVI-Q) 0.3 mg/0.3 mL IJ SOAJ injection Inject 0.3 mg into the muscle as needed for anaphylaxis. 12/24/20  Yes Valentina Shaggy, MD  ferric citrate (AURYXIA) 1 GM 210 MG(Fe) tablet Take 210-420 mg by mouth See admin instructions. Take 2 tablets (420 mg) by mouth with each meal & take 1 tablet (210 mg) by mouth with snacks.   Yes [provider]  fluticasone (FLONASE) 50 MCG/ACT nasal spray Place 2 sprays into both nostrils daily as needed for allergies or rhinitis. 12/07/19  Yes Valentina Shaggy, MD  gabapentin (NEURONTIN) 100 MG capsule TAKE 1 CAPSULE (100 MG TOTAL) BY MOUTH AT BEDTIME. WHEN NECESSARY FOR NEUROPATHY PAIN Patient taking differently: Take 100 mg by mouth at bedtime as needed (pain). When necessary for neuropathy pain 12/13/18  Yes Newt Minion, MD  glipiZIDE (GLUCOTROL) 5 MG tablet Take 5 mg by mouth daily before breakfast.   Yes [provider]  guaiFENesin-codeine 100-10 MG/5ML syrup Take 5 mLs by mouth 3 (three) times daily as needed for cough. 07/08/21  Yes Valentina Shaggy, MD  isosorbide-hydrALAZINE (BIDIL) 20-37.5 MG tablet Take 1 tablet by mouth 3 (three) times daily. BID on days of dialysis Patient taking differently: Take 1 tablet by mouth See admin instructions. hs on Monday,Wednesday,Friday (dialysis days) Tid on Tuesday,Thursday,Saturday,Sunday(non-dialysis days) 01/09/21  Yes Adrian Prows, MD  lidocaine-prilocaine (EMLA) cream Apply 1 application topically 3 (three) times a week. 1-2 hours prior to dialysis 01/07/18  Yes [provider]  montelukast (SINGULAIR) 10 MG tablet TAKE 1 TABLET BY MOUTH  EVERYDAY AT BEDTIME 06/24/21  Yes Valentina Shaggy, MD  omeprazole (PRILOSEC) 20 MG capsule Take 20 mg by mouth daily.    Yes [provider]  ondansetron (ZOFRAN) 4 MG tablet Take 1 tablet (4 mg total) by mouth every 6 (six) hours as needed for nausea. 05/22/19  Yes Aline August, MD  polyethylene glycol (MIRALAX / GLYCOLAX) 17 g packet Take 17 g by  mouth daily. Patient taking differently: Take 17 g by mouth daily as needed for mild constipation. 02/04/20  Yes Wieters, [place] C, PA-C  Spacer/Aero-Holding Chambers (AEROCHAMBER PLUS) inhaler Use with inhaler 03/09/21  Yes Melynda Ripple, MD  traZODone (DESYREL) 50 MG tablet Take 50 mg by mouth at bedtime as needed for sleep. 10/29/19  Yes [provider]  atorvastatin (LIPITOR) 40 MG tablet Take 1 tablet (40 mg total) by mouth daily. Patient not taking: Reported on 08/03/2021 01/05/20   Adrian Prows, MD  docusate sodium (COLACE) 100 MG capsule Take 1 capsule (100 mg total) by mouth 2 (two) times daily. Patient not taking: Reported on 08/03/2021 02/04/20   Wieters, Hallie C, PA-C  famotidine (PEPCID) 10 MG tablet Take 1 tablet (10 mg total) by mouth every other day. Patient not taking: Reported on 06/24/2021 03/09/21   Melynda Ripple, MD  fluticasone-salmeterol (ADVAIR [facility]) [phone] MCG/ACT inhaler Inhale two puffs twice daily Patient not taking: Reported on 08/03/2021 12/24/20   Valentina Shaggy, MD  ipratropium (ATROVENT) 0.06 % nasal spray Place 2 sprays into both nostrils 4 (four) times daily. Patient not taking: Reported on 08/03/2021 03/09/21   Melynda Ripple, MD  Vitamin D, Ergocalciferol, (DRISDOL) 1.25 MG (50000 UT) CAPS capsule Take 50,000 Units by mouth every Sunday. Takes on Sunday Patient not taking: Reported on 08/03/2021    [provider]    Physical Exam:  Constitutional: Elderly female currently eating dinner Vitals:   08/03/21 1027 08/03/21 1030 08/03/21 1045 08/03/21 1100  BP: (!) 178/73 (!) 183/79  (!) 159/73 (!) 160/57  Pulse:  95    Resp: 20 18 16  (!) 21  Temp:      TempSrc:      SpO2:  100% 99% 100%  Weight:      Height:       Eyes: PERRL, lids and conjunctivae normal ENMT: Mucous membranes are moist. Posterior pharynx clear of any exudate or lesions. Poor dentition Neck: normal, supple,  no JVD Respiratory: Normal respiratory effort without significant crackles or wheezes appreciated.  O2 saturations currently maintained on room air. Cardiovascular: Regular rate and rhythm, no murmurs / rubs / gallops. No extremity edema. 2+ pedal pulses. No carotid bruits.  Abdomen: no tenderness, no masses palpated. No hepatosplenomegaly. Bowel sounds positive.  Musculoskeletal: no clubbing / cyanosis.  Bilateral transmetatarsal amputations with orthotics sitting at the side of the be. Skin: no rashes, lesions, ulcers. No induration Neurologic: CN 2-12 grossly intact. Sensation intact, DTR normal. Strength 5/5 in all 4.  Psychiatric: Normal judgment and insight. Alert and oriented x 3. Normal mood.     Labs on Admission: I have personally reviewed following labs and imaging studies  CBC: No results for input(s): WBC, NEUTROABS, HGB, HCT, MCV, PLT in the last 168 hours. Basic Metabolic Panel: Recent Labs  Lab 08/03/21 0445  NA 132*  K 2.9*  CL 92*  CO2 27  GLUCOSE 137*  BUN 18  CREATININE 5.31*  CALCIUM 9.5   GFR: Estimated Creatinine Clearance: 8.9 mL/min (A) (by C-G formula based on SCr of 5.31 mg/dL (H)). Liver Function Tests: No results for input(s): AST, ALT, ALKPHOS, BILITOT, PROT, ALBUMIN in the last 168 hours. No results for input(s): LIPASE, AMYLASE in the last 168 hours. No results for input(s): AMMONIA in the last 168 hours. Coagulation Profile: No results for input(s): INR, PROTIME in the last 168 hours. Cardiac Enzymes: No results for input(s): CKTOTAL, CKMB, CKMBINDEX, TROPONINI in the last 168 hours. BNP (last 3  results) No results for input(s): PROBNP in  the last 8760 hours. HbA1C: No results for input(s): HGBA1C in the last 72 hours. CBG: No results for input(s): GLUCAP in the last 168 hours. Lipid Profile: No results for input(s): CHOL, HDL, LDLCALC, TRIG, CHOLHDL, LDLDIRECT in the last 72 hours. Thyroid Function Tests: No results for input(s): TSH, T4TOTAL, FREET4, T3FREE, THYROIDAB in the last 72 hours. Anemia Panel: No results for input(s): VITAMINB12, FOLATE, FERRITIN, TIBC, IRON, RETICCTPCT in the last 72 hours. Urine analysis:    Component Value Date/Time   COLORURINE YELLOW 07/07/2016 0914   APPEARANCEUR CLOUDY (A) 07/07/2016 0914   LABSPEC >1.030 (H) 07/07/2016 0914   PHURINE 5.5 07/07/2016 0914   GLUCOSEU NEGATIVE 07/07/2016 0914   HGBUR SMALL (A) 07/07/2016 0914   BILIRUBINUR SMALL (A) 07/07/2016 0914   KETONESUR 15 (A) 07/07/2016 0914   PROTEINUR >300 (A) 07/07/2016 0914   UROBILINOGEN 0.2 07/30/2014 2000   NITRITE NEGATIVE 07/07/2016 0914   LEUKOCYTESUR SMALL (A) 07/07/2016 0914   Sepsis Labs: Recent Results (from the past 240 hour(s))  Resp Panel by RT-PCR (Flu A&B, Covid) Nasopharyngeal Swab     Status: None   Collection Time: 08/03/21  4:45 AM   Specimen: Nasopharyngeal Swab; Nasopharyngeal(NP) swabs in vial transport medium  Result Value Ref Range Status   SARS Coronavirus 2 by RT PCR NEGATIVE NEGATIVE Final    Comment: (NOTE) SARS-CoV-2 target nucleic acids are NOT DETECTED.  The SARS-CoV-2 RNA is generally detectable in upper respiratory specimens during the acute phase of infection. The lowest concentration of SARS-CoV-2 viral copies this assay can detect is 138 copies/mL. A negative result does not preclude SARS-Cov-2 infection and should not be used as the sole basis for treatment or other patient management decisions. A negative result may occur with  improper specimen collection/handling, submission of specimen other than nasopharyngeal swab, presence of viral mutation(s) within the areas targeted  by this assay, and inadequate number of viral copies(<138 copies/mL). A negative result must be combined with clinical observations, patient history, and epidemiological information. The expected result is Negative.  Fact Sheet for Patients:  EntrepreneurPulse.com.au  Fact Sheet for Healthcare Providers:  IncredibleEmployment.be  This test is no t yet approved or cleared by the [place] FDA and  has been authorized for detection and/or diagnosis of SARS-CoV-2 by FDA under an Emergency Use Authorization (EUA). This EUA will remain  in effect (meaning this test can be used) for the duration of the COVID-19 declaration under Section 564(b)(1) of the Act, 21 U.S.C.section 360bbb-3(b)(1), unless the authorization is terminated  or revoked sooner.       Influenza A by PCR NEGATIVE NEGATIVE Final   Influenza B by PCR NEGATIVE NEGATIVE Final    Comment: (NOTE) The Xpert Xpress SARS-CoV-2/FLU/RSV plus assay is intended as an aid in the diagnosis of influenza from Nasopharyngeal swab specimens and should not be used as a sole basis for treatment. Nasal washings and aspirates are unacceptable for Xpert Xpress SARS-CoV-2/FLU/RSV testing.  Fact Sheet for Patients: EntrepreneurPulse.com.au  Fact Sheet for Healthcare Providers: IncredibleEmployment.be  This test is not yet approved or cleared by the [place] FDA and has been authorized for detection and/or diagnosis of SARS-CoV-2 by FDA under an Emergency Use Authorization (EUA). This EUA will remain in effect (meaning this test can be used) for the duration of the COVID-19 declaration under Section 564(b)(1) of the Act, 21 U.S.C. section 360bbb-3(b)(1), unless the authorization is terminated or revoked.  Performed at [facility]  Lab, [address], [place], [place] [address]      Radiological Exams on Admission: DG Chest Port 1 View  Result Date:  08/03/2021 CLINICAL DATA:  Chest pain EXAM: PORTABLE CHEST 1 VIEW COMPARISON:  03/13/2021 FINDINGS: 0514 hours The cardio pericardial silhouette is enlarged. Vascular congestion noted with diffuse interstitial opacity suggesting edema. Bones are diffusely demineralized. Telemetry leads overlie the chest. IMPRESSION: Enlarged cardiopericardial silhouette with vascular congestion and diffuse interstitial opacity suggesting edema. Electronically Signed   By: Misty Stanley M.D.   On: 08/03/2021 05:39    EKG: Independently reviewed.  Sinus rhythm with premature atrial complexes and QT prolonged at 539  Assessment/Plan Fluid overload   diastolic CHF  ESRD on HD: Patient reports that she has been compliant with dialysis, but was awoken out of her sleep this morning feeling short of breath.  Admits that she may not have been abiding by dietary and/ or fluid restrictions.  Chest x-ray noted enlarged cardiac silhouette with vascular congestion and diffuse interstitial opacity suggesting edema. -Admit to a medical telemetry bed -Continuous pulse oximetry with oxygen as needed to maintain O2 saturation greater than 92% -Continue nitroglycerin drip and wean off when able -Renal/carb modified diet with fluid restriction -HD per nephrology, appreciate your consultative services.  Hypertensive urgency: Blood pressures initially elevated up to 226/112 on admission.  She had been ordered her home medications and started on a nitroglycerin drip -Continue home blood pressure regimen -Wean nitroglycerin drip off   Hypokalemia: Acute.  Potassium initially 2.9 on admission.  Patient was given 4 ounces of orange juice. -Recheck labs in a.m. 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 -Continue Flonase and Singulair -Dulera substitution for Symbicort -Albuterol nebs as needed for shortness of  breath/wheezing -Transitions of care consulted for medication needed  Prolonged QT interval: QTC 539 -Avoid QT prolonging medications -Correct electrolyte abnormalities  Diabetes mellitus type 2 with neuropathy: On admission blood sugar 137.  Home medication regimen includes glipizide 5 mg with breakfast. -Hypoglycemic protocol -Hold glipizide -Continue gabapentin as needed -CBGs before every meal with very sensitive SSI  Anxiety -Continue BuSpar as needed   Hyperlipidemia -Continue atorvastatin  S/p bilateral transmetatarsal amputation -Continue use of orthotics with ambulation    DVT prophylaxis: Heparin Code Status: Full Family Communication: Son updated at bedside Disposition Plan: Hopefully discharge home once medically stable Consults called: Neurology Admission status: Inpatient  Norval Morton MD [facility]   If 7PM-7AM, please contact night-coverage   08/03/2021, 11:20 AM

## 2021-08-03 NOTE — ED Provider Notes (Signed)
[facility] EMERGENCY DEPARTMENT Provider Note  CSN: [id number] Arrival date & time: 08/03/21 0404  Chief Complaint(s) Shortness of Breath  HPI Destiny Day is a 71 y.o. female with a past medical history listed below including asthma, CHF with a last EF of 60%, ESRD on dialysis MWF and hypertension here for sudden onset shortness of breath at awoke her this morning around 3 AM.  She reports waking up needing to cough and felt short of breath after a severe coughing spell.  Shortness of breath does improve when not coughing however she has been coughing frequently.  Endorsing nasal congestion.  No known fevers or chills.  No known sick contacts.  No chest pain.  No nausea or vomiting.  No abdominal pain.  No edema.   Patient has not missed any of her dialysis sessions.  Last one Friday.  HPI  Past Medical History Past Medical History:  Diagnosis Date   Abdominal bruit    Anxiety    Arthritis    Osteoarthritis   Asthma    Cervical disc disease    "pinced nerve"   CHF (congestive heart failure) ([place])    Diabetes mellitus    Type II   Diverticulitis    ESRD (end stage renal disease) ([place])    dialysis - M/W/F- [place]   GERD (gastroesophageal reflux disease)    from medications   GI bleed 03/31/2013   Head injury 07/2017   History of hiatal hernia    Hyperlipidemia    Hypertension    Neuropathy    left leg   Nuclear sclerotic cataract of right eye 04/23/2020   Osteoporosis    Peripheral vascular disease ([place])    Pneumonia    "very young" and a few years ago   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 ([place])    Vitrectomy membrane peel endolaser 05-07-2021   Seasonal allergies    Shortness of breath dyspnea    WIth exertion, when fluid builds   Sleep apnea    can't afford cpap    Vitreomacular traction syndrome, 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   05-07-2021  Vitrectomy, membrane peel, PRP, resection  posterior segment of NVD, remnants of peripheral NVE remain in place due to atrophic retina, no vitreous substitute   Patient Active Problem List   Diagnosis Date Noted   Acute on chronic heart failure ([place]) 01/05/2021   Macular pucker, right eye 10/29/2020   Nausea with vomiting, unspecified 09/10/2020   Pseudophakia of left eye 07/02/2020   Diabetic macular edema of right eye with proliferative retinopathy associated with type 2 diabetes mellitus ([place]) 07/02/2020   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 type 2 diabetes mellitus ([place]) 04/23/2020   Allergy, unspecified, initial encounter 04/04/2020   Anaphylactic shock, unspecified, initial encounter 04/04/2020   Secondary hypertension, unspecified 12/13/2019   Other disorders of phosphorus metabolism 08/24/2019   Paroxysmal atrial fibrillation ([place]) 06/13/2019   Hypocalcemia 04/17/2019   Old bucket handle tear of lateral meniscus of right knee    Old peripheral tear of medial meniscus of right knee    Staphylococcal arthritis, unspecified joint ([place]) 05/11/2018   Septic arthritis of knee, right ([place]) 05/06/2018   Degenerative tear of posterior horn of medial meniscus, right    Old complex tear of lateral meniscus of right knee    Loose body in knee, right    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[place]) 02/03/2018   End-stage  renal disease on hemodialysis ([place])    Respiratory failure ([place]) 01/21/2018   Achilles tendon contracture, right 11/09/2017   Labile blood glucose    Anemia of infection and chronic disease    Black stool    Right sided weakness    Subdural hemorrhage following injury 08/30/2017   Diabetic peripheral neuropathy (HCC)    Chronic obstructive pulmonary disease ([place])    Subdural hematoma 08/24/2017   Traumatic subdural hematoma 08/23/2017   Fall    SDH (subdural hematoma)    Acute respiratory failure with hypoxia (HCC)    Diabetes mellitus type 2 in  nonobese (HCC)    Leukocytosis    Labile blood pressure    Generalized anxiety disorder    Debility 08/16/2017   Amputated great toe of right foot (HCC)    Amputated toe of left foot (HCC)    Post-operative pain    ESRD on dialysis [facility])    Anxiety state    Acute blood loss anemia    Diarrhea, unspecified 01/16/2017   Dependence on renal dialysis ([place]) 01/06/2017   Idiopathic chronic venous hypertension of both lower extremities with inflammation 10/13/2016   S/P transmetatarsal amputation of foot, left ([place]) 09/21/2016   Onychomycosis 08/15/2016   Unspecified protein-calorie malnutrition ([place]) 07/29/2016   Other mechanical complication of femoral arterial graft (bypass), initial encounter ([place]) 07/14/2016   Other pneumonia, unspecified organism 07/11/2016   Age-related osteoporosis without current pathological fracture 07/11/2016   Cervical disc disorder, unspecified, unspecified cervical region 07/11/2016   Diverticulitis of intestine, part unspecified, without perforation or abscess without bleeding 07/11/2016   Unspecified abdominal hernia without obstruction or gangrene 07/11/2016   Gastro-esophageal reflux disease with esophagitis 07/11/2016   Primary generalized (osteo)arthritis 07/11/2016   Peripheral vascular disease ([place]) 07/08/2016   Cellulitis of left toe 06/29/2016   Atherosclerosis of native artery of left lower extremity with gangrene ([place]) 25/11/3974   Complication of vascular dialysis catheter 02/29/2016   Encounter for screening for other viral diseases 01/10/2016   Encounter for removal of sutures 10/29/2015   Other fluid overload 10/23/2015   Anemia in chronic kidney disease 10/16/2015   Essential (primary) hypertension 10/16/2015   Acute pulmonary edema ([place]) 10/16/2015   Coagulation defect, unspecified ([place]) 10/16/2015   Acute respiratory failure ([place]) 10/16/2015   Hyperlipidemia, unspecified 10/16/2015   Iron deficiency anemia, unspecified 10/16/2015    Pain, unspecified 10/16/2015   Pruritus, unspecified 10/16/2015   Secondary hyperparathyroidism of renal origin ([place]) 10/16/2015   Shortness of breath 10/16/2015   GERD (gastroesophageal reflux disease) 10/07/2015   ARF (acute renal failure) ([place])    Hypothermia 06/12/2015   End stage renal disease ([place]) 06/12/2015   Unspecified diastolic (congestive) heart failure ([place]) 07/30/2014   CKD (chronic kidney disease) stage 4, GFR 15-29 ml/min (HCC) 06/27/2014   Hypertensive heart disease 05/25/2013   CKD (chronic kidney disease) stage 3, GFR 30-59 ml/min (HCC) 05/25/2013   Pulmonary edema 05/23/2013   Type 2 diabetes mellitus with diabetic chronic kidney disease ([place]) 05/23/2013   Type 2 diabetes mellitus with hyperosmolar nonketotic hyperglycemia ([place]) 04/13/2013   Acute on chronic diastolic heart failure ([place]) 04/13/2013   Gastrointestinal hemorrhage, unspecified 03/31/2013   Hypokalemia 08/24/2012   Hyperlipidemia associated with type 2 diabetes mellitus ([place]) 12/27/2007   Allergic rhinitis due to pollen 12/27/2007   Unspecified asthma, uncomplicated 73/41/9379   Home Medication(s) Prior to Admission medications   Medication Sig Start Date End Date Taking? Authorizing Provider  albuterol (PROAIR HFA) 108 (90 Base) MCG/ACT inhaler  Inhale 2 puffs into the lungs every 4 (four) hours as needed for wheezing or shortness of breath. 01/07/21  Yes Valentina Shaggy, MD  amLODipine (NORVASC) 10 MG tablet Take 10 mg by mouth at bedtime.   Yes [provider]  aspirin EC 81 MG tablet Take 81 mg by mouth daily.   Yes [provider]  atorvastatin (LIPITOR) 80 MG tablet Take 80 mg by mouth at bedtime. 07/17/21  Yes [provider]  busPIRone (BUSPAR) 7.5 MG tablet Take 7.5 mg by mouth 2 (two) times daily as needed (anxiety). 10/29/19  Yes [provider]  carvedilol (COREG) 12.5 MG tablet Take 12.5 mg by mouth See admin instructions. Take 1 tablet (12.5 mg) by  mouth once daily in the evening on Mondays, Wednesdays, & Fridays. (Dialysis) Take 1 tablet (12.5 mg) by mouth twice daily on Sundays, Tuesdays, Thursdays, & Saturdays. (Non Dialysis)   Yes [provider]  cinacalcet (SENSIPAR) 90 MG tablet Take 90 mg by mouth See admin instructions. Take 1 tablet (90 mg) by mouth in the evening on Mondays, Wednesdays, & Fridays (dialysis) Take 1 tablet (90 mg) by mouth twice daily on Sundays, Tuesdays, Thursdays, Saturdays. (non dialysis) 11/28/18  Yes [provider]  EPINEPHrine (AUVI-Q) 0.3 mg/0.3 mL IJ SOAJ injection Inject 0.3 mg into the muscle as needed for anaphylaxis. 12/24/20  Yes Valentina Shaggy, MD  ferric citrate (AURYXIA) 1 GM 210 MG(Fe) tablet Take 210-420 mg by mouth See admin instructions. Take 2 tablets (420 mg) by mouth with each meal & take 1 tablet (210 mg) by mouth with snacks.   Yes [provider]  fluticasone (FLONASE) 50 MCG/ACT nasal spray Place 2 sprays into both nostrils daily as needed for allergies or rhinitis. 12/07/19  Yes Valentina Shaggy, MD  gabapentin (NEURONTIN) 100 MG capsule TAKE 1 CAPSULE (100 MG TOTAL) BY MOUTH AT BEDTIME. WHEN NECESSARY FOR NEUROPATHY PAIN Patient taking differently: Take 100 mg by mouth at bedtime as needed (pain). When necessary for neuropathy pain 12/13/18  Yes Newt Minion, MD  glipiZIDE (GLUCOTROL) 5 MG tablet Take 5 mg by mouth daily before breakfast.   Yes [provider]  guaiFENesin-codeine 100-10 MG/5ML syrup Take 5 mLs by mouth 3 (three) times daily as needed for cough. 07/08/21  Yes Valentina Shaggy, MD  isosorbide-hydrALAZINE (BIDIL) 20-37.5 MG tablet Take 1 tablet by mouth 3 (three) times daily. BID on days of dialysis Patient taking differently: Take 1 tablet by mouth See admin instructions. hs on Monday,Wednesday,Friday (dialysis days) Tid on Tuesday,Thursday,Saturday,Sunday(non-dialysis days) 01/09/21  Yes Adrian Prows, MD  lidocaine-prilocaine  (EMLA) cream Apply 1 application topically 3 (three) times a week. 1-2 hours prior to dialysis 01/07/18  Yes [provider]  montelukast (SINGULAIR) 10 MG tablet TAKE 1 TABLET BY MOUTH EVERYDAY AT BEDTIME 06/24/21  Yes Valentina Shaggy, MD  omeprazole (PRILOSEC) 20 MG capsule Take 20 mg by mouth daily.    Yes [provider]  ondansetron (ZOFRAN) 4 MG tablet Take 1 tablet (4 mg total) by mouth every 6 (six) hours as needed for nausea. 05/22/19  Yes Aline August, MD  polyethylene glycol (MIRALAX / GLYCOLAX) 17 g packet Take 17 g by mouth daily. Patient taking differently: Take 17 g by mouth daily as needed for mild constipation. 02/04/20  Yes Wieters, [place] C, PA-C  Spacer/Aero-Holding Chambers (AEROCHAMBER PLUS) inhaler Use with inhaler 03/09/21  Yes Melynda Ripple, MD  traZODone (DESYREL) 50 MG tablet Take 50 mg by  mouth at bedtime as needed for sleep. 10/29/19  Yes [provider]  atorvastatin (LIPITOR) 40 MG tablet Take 1 tablet (40 mg total) by mouth daily. Patient not taking: Reported on 08/03/2021 01/05/20   Adrian Prows, MD  docusate sodium (COLACE) 100 MG capsule Take 1 capsule (100 mg total) by mouth 2 (two) times daily. Patient not taking: Reported on 08/03/2021 02/04/20   Wieters, Hallie C, PA-C  famotidine (PEPCID) 10 MG tablet Take 1 tablet (10 mg total) by mouth every other day. Patient not taking: Reported on 06/24/2021 03/09/21   Melynda Ripple, MD  fluticasone-salmeterol (ADVAIR [facility]) [id number] MCG/ACT inhaler Inhale two puffs twice daily Patient not taking: Reported on 08/03/2021 12/24/20   Valentina Shaggy, MD  ipratropium (ATROVENT) 0.06 % nasal spray Place 2 sprays into both nostrils 4 (four) times daily. Patient not taking: Reported on 08/03/2021 03/09/21   Melynda Ripple, MD  Vitamin D, Ergocalciferol, (DRISDOL) 1.25 MG (50000 UT) CAPS capsule Take 50,000 Units by mouth every Sunday. Takes on Sunday Patient not taking: Reported on 08/03/2021     [provider]                                                                                                                                    Allergies Adhesive  [tape], Lisinopril, and Prednisone  Review of Systems Review of Systems As noted in HPI  Physical Exam Vital Signs  I have reviewed the triage vital signs BP (!) 149/89    Pulse 98    Temp 98 F (36.7 C) (Oral)    Resp 16    Ht 5\' 5"  (1.651 m)    Wt 65.8 kg    SpO2 100%    BMI 24.13 kg/m   Physical Exam Vitals reviewed.  Constitutional:      General: She is not in acute distress.    Appearance: She is well-developed. She is not diaphoretic.  HENT:     Head: Normocephalic and atraumatic.     Nose: Nose normal.  Eyes:     General: No scleral icterus.       Right eye: No discharge.        Left eye: No discharge.     Conjunctiva/sclera: Conjunctivae normal.     Pupils: Pupils are equal, round, and reactive to light.  Cardiovascular:     Rate and Rhythm: Normal rate and regular rhythm.     Heart sounds: No murmur heard.   No friction rub. No gallop.  Pulmonary:     Effort: Pulmonary effort is normal. No respiratory distress.     Breath sounds: No stridor. Rales (faint rales throughout) present. No wheezing.  Abdominal:     General: There is no distension.     Palpations: Abdomen is soft.     Tenderness: There is no abdominal tenderness.  Musculoskeletal:        General: No tenderness.  Cervical back: Normal range of motion and neck supple.     Comments: Hard leg brace, bilateral  Skin:    General: Skin is warm and dry.     Findings: No erythema or rash.  Neurological:     Mental Status: She is alert and oriented to person, place, and time.    ED Results and Treatments Labs (all labs ordered are listed, but only abnormal results are displayed) Labs Reviewed  BASIC METABOLIC PANEL - Abnormal; Notable for the following components:      Result Value   Sodium 132 (*)    Potassium 2.9 (*)     Chloride 92 (*)    Glucose, Bld 137 (*)    Creatinine, Ser 5.31 (*)    GFR, Estimated 8 (*)    All other components within normal limits  RESP PANEL BY RT-PCR (FLU A&B, COVID) ARPGX2  CBC WITH DIFFERENTIAL/PLATELET  CBC WITH DIFFERENTIAL/PLATELET                                                                                                                         EKG  EKG Interpretation  Date/Time:  Sunday August 03 2021 04:44:08 EST Ventricular Rate:  95 PR Interval:  165 QRS Duration: 99 QT Interval:  428 QTC Calculation: 539 R Axis:   26 Text Interpretation: Sinus rhythm Atrial premature complexes Anteroseptal infarct, old Nonspecific repol abnormality, lateral leads Prolonged QT interval Confirmed by Addison Lank [phone]) on 08/03/2021 5:00:49 AM       Radiology DG Chest Port 1 View  Result Date: 08/03/2021 CLINICAL DATA:  Chest pain EXAM: PORTABLE CHEST 1 VIEW COMPARISON:  03/13/2021 FINDINGS: 0514 hours The cardio pericardial silhouette is enlarged. Vascular congestion noted with diffuse interstitial opacity suggesting edema. Bones are diffusely demineralized. Telemetry leads overlie the chest. IMPRESSION: Enlarged cardiopericardial silhouette with vascular congestion and diffuse interstitial opacity suggesting edema. Electronically Signed   By: Misty Stanley M.D.   On: 08/03/2021 05:39    Pertinent labs & imaging results that were available during my care of the patient were reviewed by me and considered in my medical decision making (see MDM for details).  Medications Ordered in ED Medications  amLODipine (NORVASC) tablet 10 mg (has no administration in time range)  carvedilol (COREG) tablet 12.5 mg (has no administration in time range)  isosorbide-hydrALAZINE (BIDIL) 20-37.5 MG per tablet 1 tablet (has no administration in time range)  ipratropium-albuterol (DUONEB) 0.5-2.5 (3) MG/3ML nebulizer solution 3 mL (has no administration in time range)  Procedures Procedures  (including critical care time)  Medical Decision Making / ED Course        Shortness of breath Associated with cough and nasal congestion.  No known fevers or sick contacts but patient does go to dialysis. Noted to be hypertensive with EMS and given nitroglycerin.  BP improved.  Still has shortness of breath with coughing. History of ESRD on dialysis MWF and has not missed any sessions Noted history of CHF with a last EF of 60% noted in 2019 echo which are personally reviewed Sating 100% on RA. No respiratory distress Considering possible hypertensive emergency, pulmonary edema from volume overload or infectious process Doubt PE or pneumothorax Work-up ordered to assess concerns above.  Labs and imaging independently interpreted by me and noted below: EKG with PACs but without other significant dysrhythmias or blocks.  No evidence of acute ischemic changes. Chest x-ray with evidence of likely CHF exacerbation CBC pending BMP consistent with known ESRD.  Mild hyponatremia and hypokalemia. COVID/influenza negative Management: Patient's initial blood pressure was reassuring after nitroglycerin but trended up.  Will provide patient with home dose of blood pressure medicine and reevaluate. Will also provide patient with DuoNeb. Plan to reassess patient after BP improves and ambulate.  If patient is stable and satting well without respiratory distress, she can likely be discharged home.  Patient has increased work of breathing or desats, she will likely need nephrology consult for hemodialysis.   Patient care turned over to oncoming provider. Patient case and results discussed in detail; please see their note for further ED managment.     Final Clinical Impression(s) / ED Diagnoses Final diagnoses:  SOB (shortness of breath)           This chart was  dictated using voice recognition software.  Despite best efforts to proofread,  errors can occur which can change the documentation meaning.    Fatima Blank, MD 08/03/21 [phone]

## 2021-08-03 NOTE — Consult Note (Signed)
[facility] Renal Consultation Note    Indication for Consultation:  Management of ESRD/hemodialysis; anemia, hypertension/volume and secondary hyperparathyroidism  BMW:UXLKG, Rachel Moulds, NP  HPI: SHANIEKA BLEA is a 71 y.o. female. ESRD on HD MWF at [facility].  Past medical history significant for T2DM, HLD, Hx GIB, GERD, HFpEF (EF 55-60% G2DD), asthma, Hx smoker (quit 2014), Hx SDH and PAD s/p b/l TMA.   Patient seen and examined at bedside.  Reports she was awaken from sleep with morning around 2:30AM due to coughing spell followed by shortness of breath.  States got out of bed and tried to "walk it off" with no improvement.  She woke her husband and called EMS.  States it feels like the last time she had fluid on her lungs.  Admits to increased PO intake yesterday with Pepsi and ice.  Currently while resting in bed she is not SOB.  Denies CP, headache, fever, chills, abdominal pain, n/v/d, weakness, dizziness and fatigue.    Pertinent findings during work up include hypertension with SBP>220, K 2.9, BUN 18, Scr 5.31, negative respiratory panel, and CXR with vascular congestion and pulmonary edema.  Patient is being admitted for further evaluation and management.   Past Medical History:  Diagnosis Date   Abdominal bruit    Anxiety    Arthritis    Osteoarthritis   Asthma    Cervical disc disease    "pinced nerve"   CHF (congestive heart failure) (HCC)    Diabetes mellitus    Type II   Diverticulitis    ESRD (end stage renal disease) ([place])    dialysis - M/W/F- [place]   GERD (gastroesophageal reflux disease)    from medications   GI bleed 03/31/2013   Head injury 07/2017   History of hiatal hernia    Hyperlipidemia    Hypertension    Neuropathy    left leg   Nuclear sclerotic cataract of right eye 04/23/2020   Osteoporosis    Peripheral vascular disease ([place])    Pneumonia    "very young" and a few years ago   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)    Vitrectomy membrane peel endolaser 05-07-2021   Seasonal allergies    Shortness of breath dyspnea    WIth exertion, when fluid builds   Sleep apnea    can't afford cpap    Vitreomacular traction syndrome, 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   05-07-2021  Vitrectomy, membrane peel, PRP, resection posterior segment of NVD, remnants of peripheral NVE remain in place due to atrophic retina, no vitreous substitute   Past Surgical History:  Procedure Laterality Date   A/V SHUNTOGRAM N/A 09/22/2016   Procedure: A/V Shuntogram - left arm;  Surgeon: Serafina Mitchell, MD;  Location: [place] [facility];  Service: Cardiovascular;  Laterality: N/A;   A/V SHUNTOGRAM N/A 03/22/2018   Procedure: A/V SHUNTOGRAM - left arm;  Surgeon: Serafina Mitchell, MD;  Location: [place] [facility];  Service: Cardiovascular;  Laterality: N/A;   A/V SHUNTOGRAM Left 11/17/2018   Procedure: A/V SHUNTOGRAM;  Surgeon: Angelia Mould, MD;  Location: [place] [facility];  Service: Cardiovascular;  Laterality: Left;   ABDOMINAL HYSTERECTOMY  1993`   AMPUTATION Left 09/01/2016   Procedure: LEFT FOOT TRANSMETATARSAL AMPUTATION;  Surgeon: Newt Minion, MD;  Location: [place];  Service: Orthopedics;  Laterality: Left;   AMPUTATION Right 08/11/2017   Procedure: RIGHT GREAT TOE AMPUTATION DIGIT;  Surgeon: Curt Jews  F, MD;  Location: [place];  Service: Vascular;  Laterality: Right;   AMPUTATION Right 12/31/2017   Procedure: RIGHT TRANSMETATARSAL AMPUTATION;  Surgeon: Newt Minion, MD;  Location: [place];  Service: Orthopedics;  Laterality: Right;   AV FISTULA PLACEMENT Left 04/21/2016   Procedure: INSERTION OF ARTERIOVENOUS (AV) GORE-TEX GRAFT ARM LEFT;  Surgeon: Elam Dutch, MD;  Location: [place];  Service: Vascular;  Laterality: Left;   BASCILIC VEIN TRANSPOSITION Left 07/10/2014   Procedure: BASCILIC VEIN TRANSPOSITION;  Surgeon: Angelia Mould, MD;  Location: [place];   Service: Vascular;  Laterality: Left;   [place] Right 11/08/2014   Procedure: FIRST STAGE BASILIC VEIN TRANSPOSITION;  Surgeon: Angelia Mould, MD;  Location: [place];  Service: Vascular;  Laterality: Right;   [place] Right 01/18/2015   Procedure: SECOND STAGE BASILIC VEIN TRANSPOSITION;  Surgeon: Angelia Mould, MD;  Location: [place];  Service: Vascular;  Laterality: Right;   CRANIOTOMY N/A 08/23/2017   Procedure: CRANIOTOMY HEMATOMA EVACUATION SUBDURAL;  Surgeon: Ashok Pall, MD;  Location: [place];  Service: Neurosurgery;  Laterality: N/A;   CRANIOTOMY Left 08/24/2017   Procedure: CRANIOTOMY FOR RECURRENT ACUTE SUBDURAL HEMATOMA;  Surgeon: Ashok Pall, MD;  Location: [place];  Service: Neurosurgery;  Laterality: Left;   ESOPHAGOGASTRODUODENOSCOPY N/A 03/31/2013   Procedure: ESOPHAGOGASTRODUODENOSCOPY (EGD);  Surgeon: Gatha Mayer, MD;  Location: [facility] ENDOSCOPY;  Service: Endoscopy;  Laterality: N/A;   EYE SURGERY     laser surgery   FEMORAL-POPLITEAL BYPASS GRAFT Left 07/08/2016   Procedure: LEFT  FEMORAL-BELOW KNEE POPLITEAL ARTERY BYPASS GRAFT USING 6MM X 80 CM PROPATEN GORETEX GRAFT WITH RINGS.;  Surgeon: [place]etta Posner, MD;  Location: [facility] OR;  Service: Vascular;  Laterality: Left;   FEMORAL-POPLITEAL BYPASS GRAFT Right 08/11/2017   Procedure: RIGHT FEMORAL TO BELOW KNEE POPLITEAL ARTERKY  BYPASS GRAFT USING 6MM RINGED PROPATEN GRAFT;  Surgeon: [place]etta Posner, MD;  Location: [place];  Service: Vascular;  Laterality: Right;   FISTULOGRAM Left 10/29/2014   Procedure: FISTULOGRAM;  Surgeon: Angelia Mould, MD;  Location: [facility] CATH LAB;  Service: Cardiovascular;  Laterality: Left;   GAS/FLUID EXCHANGE Left 12/20/2018   Procedure: AIR GAS EXCHANGE;  Surgeon: Jalene Mullet, MD;  Location: [place];  Service: Ophthalmology;  Laterality: Left;   INSERTION OF DIALYSIS CATHETER N/A 09/05/2020   Procedure: INSERTION OF DIALYSIS CATHETER;  Surgeon: Cherre Robins, MD;  Location: [place];  Service: Vascular;  Laterality: N/A;   INSERTION OF DIALYSIS CATHETER Left 03/13/2021   Procedure: INSERTION OF DIALYSIS CATHETER;  Surgeon: Waynetta Sandy, MD;  Location: [place];  Service: Vascular;  Laterality: Left;   KNEE ARTHROSCOPY Right 05/06/2018   Procedure: RIGHT KNEE ARTHROSCOPY;  Surgeon: Newt Minion, MD;  Location: [place];  Service: Orthopedics;  Laterality: Right;   KNEE ARTHROSCOPY Right 06/10/2018   Procedure: RIGHT KNEE ARTHROSCOPY AND DEBRIDEMENT;  Surgeon: Newt Minion, MD;  Location: [place];  Service: Orthopedics;  Laterality: Right;   LIGATION OF ARTERIOVENOUS  FISTULA Left 04/21/2016   Procedure: LIGATION OF ARTERIOVENOUS  FISTULA LEFT ARM;  Surgeon: Elam Dutch, MD;  Location: [place];  Service: Vascular;  Laterality: Left;   LOWER EXTREMITY ANGIOGRAPHY N/A 04/06/2017   Procedure: Lower Extremity Angiography - Right;  Surgeon: Serafina Mitchell, MD;  Location: [place] [facility];  Service: Cardiovascular;  Laterality: N/A;   MEMBRANE PEEL Left 12/20/2018   Procedure: MEMBRANE PEEL;  Surgeon: Jalene Mullet, MD;  Location: [place] [facility];  Service: Ophthalmology;  Laterality: Left;   PATCH ANGIOPLASTY Right 01/18/2015   Procedure: BASILIC VEIN PATCH ANGIOPLASTY USING VASCUGUARD PATCH;  Surgeon: Angelia Mould, MD;  Location: [place];  Service: Vascular;  Laterality: Right;   PERIPHERAL VASCULAR BALLOON ANGIOPLASTY  09/22/2016   Procedure: Peripheral Vascular Balloon Angioplasty;  Surgeon: Serafina Mitchell, MD;  Location: [place] [facility];  Service: Cardiovascular;;  Lt. Fistula   PERIPHERAL VASCULAR BALLOON ANGIOPLASTY Left 03/22/2018   Procedure: PERIPHERAL VASCULAR BALLOON ANGIOPLASTY;  Surgeon: Serafina Mitchell, MD;  Location: [place] [facility];  Service: Cardiovascular;  Laterality: Left;  Arm shunt   PERIPHERAL VASCULAR CATHETERIZATION N/A 06/23/2016   Procedure: Abdominal Aortogram w/Lower Extremity;  Surgeon: Serafina Mitchell, MD;   Location: [place] [facility];  Service: Cardiovascular;  Laterality: N/A;   PERIPHERAL VASCULAR CATHETERIZATION  06/23/2016   Procedure: Peripheral Vascular Intervention;  Surgeon: Serafina Mitchell, MD;  Location: [place] [facility];  Service: Cardiovascular;;  lt common and external illiac artery   PHOTOCOAGULATION WITH LASER Left 12/20/2018   Procedure: PHOTOCOAGULATION WITH LASER;  Surgeon: Jalene Mullet, MD;  Location: [place];  Service: Ophthalmology;  Laterality: Left;   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   Procedure: REPAIR OF COMPLEX TRACTION RETINAL DETACHMENT;  Surgeon: Jalene Mullet, MD;  Location: [place];  Service: Ophthalmology;  Laterality: Left;   REVISION OF ARTERIOVENOUS GORETEX GRAFT Left 09/05/2020   Procedure: REVISION OF ARTERIOVENOUS GORETEX GRAFT LEFT;  Surgeon: Cherre Robins, MD;  Location: [facility];  Service: Vascular;  Laterality: Left;   Family History  Problem Relation Age of Onset   Other Mother        not sure of cause of death   Diabetes Father    Diabetes Sister    Diabetes Sister    Pancreatic cancer Maternal Grandmother    Colon cancer Neg Hx    Social History:  reports that she quit smoking about 9 years ago. Her smoking use included cigarettes. She has a 14.00 pack-year smoking history. She has never used smokeless tobacco. She reports that she does not drink alcohol and does not use drugs. Allergies  Allergen Reactions   Adhesive  [Tape] Hives   Lisinopril Cough   Prednisone Swelling and Other (See Comments)    Excessive fluid buildup   Prior to Admission medications   Medication Sig Start Date End Date Taking? Authorizing Provider  albuterol (PROAIR HFA) 108 (90 Base) MCG/ACT inhaler Inhale 2 puffs into the lungs every 4 (four) hours as needed for wheezing or shortness of breath. 01/07/21  Yes Valentina Shaggy, MD  amLODipine (NORVASC) 10 MG tablet Take 10 mg by mouth at bedtime.   Yes [provider]  aspirin  EC 81 MG tablet Take 81 mg by mouth daily.   Yes [provider]  atorvastatin (LIPITOR) 80 MG tablet Take 80 mg by mouth at bedtime. 07/17/21  Yes [provider]  busPIRone (BUSPAR) 7.5 MG tablet Take 7.5 mg by mouth 2 (two) times daily as needed (anxiety). 10/29/19  Yes [provider]  carvedilol (COREG) 12.5 MG tablet Take 12.5 mg by mouth See admin instructions. Take 1 tablet (12.5 mg) by mouth once daily in the evening on Mondays, Wednesdays, & Fridays. (Dialysis) Take 1 tablet (12.5 mg) by mouth twice daily on Sundays, Tuesdays, Thursdays, & Saturdays. (Non Dialysis)   Yes [provider]  cinacalcet (SENSIPAR) 90 MG tablet Take 90 mg by  mouth See admin instructions. Take 1 tablet (90 mg) by mouth in the evening on Mondays, Wednesdays, & Fridays (dialysis) Take 1 tablet (90 mg) by mouth twice daily on Sundays, Tuesdays, Thursdays, Saturdays. (non dialysis) 11/28/18  Yes [provider]  EPINEPHrine (AUVI-Q) 0.3 mg/0.3 mL IJ SOAJ injection Inject 0.3 mg into the muscle as needed for anaphylaxis. 12/24/20  Yes Valentina Shaggy, MD  ferric citrate (AURYXIA) 1 GM 210 MG(Fe) tablet Take 210-420 mg by mouth See admin instructions. Take 2 tablets (420 mg) by mouth with each meal & take 1 tablet (210 mg) by mouth with snacks.   Yes [provider]  fluticasone (FLONASE) 50 MCG/ACT nasal spray Place 2 sprays into both nostrils daily as needed for allergies or rhinitis. 12/07/19  Yes Valentina Shaggy, MD  gabapentin (NEURONTIN) 100 MG capsule TAKE 1 CAPSULE (100 MG TOTAL) BY MOUTH AT BEDTIME. WHEN NECESSARY FOR NEUROPATHY PAIN Patient taking differently: Take 100 mg by mouth at bedtime as needed (pain). When necessary for neuropathy pain 12/13/18  Yes Newt Minion, MD  glipiZIDE (GLUCOTROL) 5 MG tablet Take 5 mg by mouth daily before breakfast.   Yes [provider]  guaiFENesin-codeine 100-10 MG/5ML syrup Take 5 mLs by mouth 3 (three)  times daily as needed for cough. 07/08/21  Yes Valentina Shaggy, MD  isosorbide-hydrALAZINE (BIDIL) 20-37.5 MG tablet Take 1 tablet by mouth 3 (three) times daily. BID on days of dialysis Patient taking differently: Take 1 tablet by mouth See admin instructions. hs on Monday,Wednesday,Friday (dialysis days) Tid on Tuesday,Thursday,Saturday,Sunday(non-dialysis days) 01/09/21  Yes Adrian Prows, MD  lidocaine-prilocaine (EMLA) cream Apply 1 application topically 3 (three) times a week. 1-2 hours prior to dialysis 01/07/18  Yes [provider]  montelukast (SINGULAIR) 10 MG tablet TAKE 1 TABLET BY MOUTH EVERYDAY AT BEDTIME 06/24/21  Yes Valentina Shaggy, MD  omeprazole (PRILOSEC) 20 MG capsule Take 20 mg by mouth daily.    Yes [provider]  ondansetron (ZOFRAN) 4 MG tablet Take 1 tablet (4 mg total) by mouth every 6 (six) hours as needed for nausea. 05/22/19  Yes Aline August, MD  polyethylene glycol (MIRALAX / GLYCOLAX) 17 g packet Take 17 g by mouth daily. Patient taking differently: Take 17 g by mouth daily as needed for mild constipation. 02/04/20  Yes Wieters, [place]ec[place] C, PA-C  Spacer/Aero-Holding Chambers (AEROCHAMBER PLUS) inhaler Use with inhaler 03/09/21  Yes Melynda Ripple, MD  traZODone (DESYREL) 50 MG tablet Take 50 mg by mouth at bedtime as needed for sleep. 10/29/19  Yes [provider]  atorvastatin (LIPITOR) 40 MG tablet Take 1 tablet (40 mg total) by mouth daily. Patient not taking: Reported on 08/03/2021 01/05/20   Adrian Prows, MD  docusate sodium (COLACE) 100 MG capsule Take 1 capsule (100 mg total) by mouth 2 (two) times daily. Patient not taking: Reported on 08/03/2021 02/04/20   Wieters, Hallie C, PA-C  famotidine (PEPCID) 10 MG tablet Take 1 tablet (10 mg total) by mouth every other day. Patient not taking: Reported on 06/24/2021 03/09/21   Melynda Ripple, MD  fluticasone-salmeterol (ADVAIR [facility]) [phone] MCG/ACT inhaler Inhale two puffs twice  daily Patient not taking: Reported on 08/03/2021 12/24/20   Valentina Shaggy, MD  ipratropium (ATROVENT) 0.06 % nasal spray Place 2 sprays into both nostrils 4 (four) times daily. Patient not taking: Reported on 08/03/2021 03/09/21   Melynda Ripple, MD  Vitamin D, Ergocalciferol, (DRISDOL) 1.25 MG (50000 UT) CAPS capsule Take 50,000 Units by  mouth every Sunday. Takes on Sunday Patient not taking: Reported on 08/03/2021    [provider]   Current Facility-Administered Medications  Medication Dose Route Frequency Provider Last Rate Last Admin   nitroGLYCERIN 50 mg in dextrose 5 % 250 mL (0.2 mg/mL) infusion  0-200 mcg/min Intravenous Continuous Long, Wonda Olds, MD 4.5 mL/hr at 08/03/21 1035 15 mcg/min at 08/03/21 1035   Current Outpatient Medications  Medication Sig Dispense Refill   albuterol (PROAIR HFA) 108 (90 Base) MCG/ACT inhaler Inhale 2 puffs into the lungs every 4 (four) hours as needed for wheezing or shortness of breath. 18 g 1   amLODipine (NORVASC) 10 MG tablet Take 10 mg by mouth at bedtime.     aspirin EC 81 MG tablet Take 81 mg by mouth daily.     atorvastatin (LIPITOR) 80 MG tablet Take 80 mg by mouth at bedtime.     busPIRone (BUSPAR) 7.5 MG tablet Take 7.5 mg by mouth 2 (two) times daily as needed (anxiety).     carvedilol (COREG) 12.5 MG tablet Take 12.5 mg by mouth See admin instructions. Take 1 tablet (12.5 mg) by mouth once daily in the evening on Mondays, Wednesdays, & Fridays. (Dialysis) Take 1 tablet (12.5 mg) by mouth twice daily on Sundays, Tuesdays, Thursdays, & Saturdays. (Non Dialysis)     cinacalcet (SENSIPAR) 90 MG tablet Take 90 mg by mouth See admin instructions. Take 1 tablet (90 mg) by mouth in the evening on Mondays, Wednesdays, & Fridays (dialysis) Take 1 tablet (90 mg) by mouth twice daily on Sundays, Tuesdays, Thursdays, Saturdays. (non dialysis)     EPINEPHrine (AUVI-Q) 0.3 mg/0.3 mL IJ SOAJ injection Inject 0.3 mg into the muscle as needed for  anaphylaxis. 1 each 1   ferric citrate (AURYXIA) 1 GM 210 MG(Fe) tablet Take 210-420 mg by mouth See admin instructions. Take 2 tablets (420 mg) by mouth with each meal & take 1 tablet (210 mg) by mouth with snacks.     fluticasone (FLONASE) 50 MCG/ACT nasal spray Place 2 sprays into both nostrils daily as needed for allergies or rhinitis. 16 g 5   gabapentin (NEURONTIN) 100 MG capsule TAKE 1 CAPSULE (100 MG TOTAL) BY MOUTH AT BEDTIME. WHEN NECESSARY FOR NEUROPATHY PAIN (Patient taking differently: Take 100 mg by mouth at bedtime as needed (pain). When necessary for neuropathy pain) 30 capsule 3   glipiZIDE (GLUCOTROL) 5 MG tablet Take 5 mg by mouth daily before breakfast.     guaiFENesin-codeine 100-10 MG/5ML syrup Take 5 mLs by mouth 3 (three) times daily as needed for cough. 473 mL 0   isosorbide-hydrALAZINE (BIDIL) 20-37.5 MG tablet Take 1 tablet by mouth 3 (three) times daily. BID on days of dialysis (Patient taking differently: Take 1 tablet by mouth See admin instructions. hs on Monday,Wednesday,Friday (dialysis days) Tid on Tuesday,Thursday,Saturday,Sunday(non-dialysis days)) 270 tablet 3   lidocaine-prilocaine (EMLA) cream Apply 1 application topically 3 (three) times a week. 1-2 hours prior to dialysis  12   montelukast (SINGULAIR) 10 MG tablet TAKE 1 TABLET BY MOUTH EVERYDAY AT BEDTIME 30 tablet 5   omeprazole (PRILOSEC) 20 MG capsule Take 20 mg by mouth daily.      ondansetron (ZOFRAN) 4 MG tablet Take 1 tablet (4 mg total) by mouth every 6 (six) hours as needed for nausea. 20 tablet 0   polyethylene glycol (MIRALAX / GLYCOLAX) 17 g packet Take 17 g by mouth daily. (Patient taking differently: Take 17 g by mouth daily as needed for mild  constipation.) 14 each 0   Spacer/Aero-Holding Chambers (AEROCHAMBER PLUS) inhaler Use with inhaler 1 each 2   traZODone (DESYREL) 50 MG tablet Take 50 mg by mouth at bedtime as needed for sleep.     atorvastatin (LIPITOR) 40 MG tablet Take 1 tablet (40 mg  total) by mouth daily. (Patient not taking: Reported on 08/03/2021) 90 tablet 3   docusate sodium (COLACE) 100 MG capsule Take 1 capsule (100 mg total) by mouth 2 (two) times daily. (Patient not taking: Reported on 08/03/2021) 30 capsule 0   famotidine (PEPCID) 10 MG tablet Take 1 tablet (10 mg total) by mouth every other day. (Patient not taking: Reported on 06/24/2021) 15 tablet 0   fluticasone-salmeterol (ADVAIR HFA) 115-21 MCG/ACT inhaler Inhale two puffs twice daily (Patient not taking: Reported on 08/03/2021) 1 each 5   ipratropium (ATROVENT) 0.06 % nasal spray Place 2 sprays into both nostrils 4 (four) times daily. (Patient not taking: Reported on 08/03/2021) 15 mL 0   Vitamin D, Ergocalciferol, (DRISDOL) 1.25 MG (50000 UT) CAPS capsule Take 50,000 Units by mouth every Sunday. Takes on Sunday (Patient not taking: Reported on 08/03/2021)     Labs: Basic Metabolic Panel: Recent Labs  Lab 08/03/21 0445  NA 132*  K 2.9*  CL 92*  CO2 27  GLUCOSE 137*  BUN 18  CREATININE 5.31*  CALCIUM 9.5   Studies/Results: DG Chest Port 1 View  Result Date: 08/03/2021 CLINICAL DATA:  Chest pain EXAM: PORTABLE CHEST 1 VIEW COMPARISON:  03/13/2021 FINDINGS: 0514 hours The cardio pericardial silhouette is enlarged. Vascular congestion noted with diffuse interstitial opacity suggesting edema. Bones are diffusely demineralized. Telemetry leads overlie the chest. IMPRESSION: Enlarged cardiopericardial silhouette with vascular congestion and diffuse interstitial opacity suggesting edema. Electronically Signed   By: Misty Stanley M.D.   On: 08/03/2021 05:39    ROS: All others negative except those listed in HPI.  Physical Exam: Vitals:   08/03/21 1220 08/03/21 1225 08/03/21 1230 08/03/21 1235  BP: (!) 121/47 118/62 127/66 (!) 144/82  Pulse: 87 85 90 89  Resp: 14 (!) 23 (!) 22 (!) 25  Temp:      TempSrc:      SpO2: 100% 99% 100% 98%  Weight:      Height:         General: Well appearing female in NAD Head:  NCAT sclera not icteric MMM Neck: Supple. No lymphadenopathy Lungs: CTA bilaterally. N+rhonchi in RLL. Breathing is unlabored on RA Heart: RRR. No murmur, rubs or gallops.  Abdomen: soft, nontender, +BS, no guarding, no rebound tenderness M/S:  Equal strength b/l in upper and lower extremities.  Lower extremities:no edema, +braces on b/l LE Neuro: AAOx3. Moves all extremities spontaneously. Psych:  Responds to questions appropriately with a normal affect. Dialysis Access: LU AVG +b/t, hyperpigmentation noted over AVG  Dialysis Orders:  [place] [place]  4[facility], BFR 400, DFR 600,  EDW 64kg, 2K/ 3.5Ca  Access: LU AVG   Heparin 2500 unit bolus with 1000 unit intermittent Mircera 30 mcg q4wks - last 07/09/21 Hectorol 41mcg IV qHD   Assessment/Plan:  Volume overload/pulmonary edema - CXR with pulmonary edema.  Has not missed HD but did get under dry weight last HD.  Admits to increased PO fluid intake.  Possible weight loss,challenge EDW and UF as tolerated.  May need to lower dry weight.  Hypokalemia - K 2.9 on admits. Given 4oz OJ.  Will use increased K bath. Check labs again prior to HD.   ESRD -  on HD MWF.  Plan for HD today/tonight due to #1.    Hypertension - BP elevated.  Not improved with home meds so IV nitro started.  Should improve with volume removal.    Anemia of CKD - Check CBC pre HD.  Secondary Hyperparathyroidism -  Ca at goal.  Check phos.  Continue home meds.   Nutrition - Renal diet w/fluid restrictions.  HFpEF 55-60% - volume management with dialysis  Jen Mow, PA-C [facility] 08/03/2021, 1:20 PM

## 2021-08-03 NOTE — ED Triage Notes (Signed)
Pt arrived via [facility]. [facility] pt is [facility] with hx of CHF. Dialysis M-W-F. Pt feels like she has fluid on lungs  181/99, 105HR  2 Nitro

## 2021-08-03 NOTE — Progress Notes (Signed)
Patient to 3E29 from ED. Vital signs obtained. On monitor. CCMD notified. Alert and oriented to room and call light. Call bell within reach.  Era Bumpers, RN

## 2021-08-03 NOTE — ED Provider Notes (Signed)
Blood pressure (!) 189/99, pulse 98, temperature 98 F (36.7 C), temperature source Oral, resp. rate 16, height 5\' 5"  (1.651 m), weight 65.8 kg, SpO2 100 %.  Assuming care from Dr. Leonette Monarch.  In short, Destiny Day is a 71 y.o. female with a chief complaint of Shortness of Breath .  Refer to the original H&P for additional details.  The current plan of care is to follow up on BP and ambulatory pulse ox.   COVID and flu are negative.  Chemistry reviewed as above with potassium of 2.9.  CBC in process.  Chest x-ray with some edema.  Discussed the case with Dr. Augustin Coupe with Nephrology.  They will coordinate dialysis given her significantly elevated blood pressures and shortness of breath with edema on x-ray.   Patient ultimately requiring nitro infusion to control BP. Home meds given previously but not improving pressures to the point of being able to ambulate and d/c. Will admit for HD. Patient has not missed any recent sessions.   Discussed patient's case with [facility], Dr. Tamala Julian to request admission. Patient and family (if present) updated with plan. Care transferred to [facility] service.  I reviewed all nursing notes, vitals, pertinent old records, EKGs, labs, imaging (as available).  CRITICAL CARE Performed by: Margette Fast Total critical care time: 35 minutes Critical care time was exclusive of separately billable procedures and treating other patients. Critical care was necessary to treat or prevent imminent or life-threatening deterioration. Critical care was time spent personally by me on the following activities: development of treatment plan with patient and/or surrogate as well as nursing, discussions with consultants, evaluation of patient's response to treatment, examination of patient, obtaining history from patient or surrogate, ordering and performing treatments and interventions, ordering and review of laboratory studies, ordering and review of radiographic studies, pulse oximetry and  re-evaluation of patient's condition.  Nanda Quinton, MD Emergency Medicine      Ceil Roderick, Wonda Olds, MD 08/03/21 [id number]

## 2021-08-04 ENCOUNTER — Other Ambulatory Visit: Payer: Self-pay

## 2021-08-04 ENCOUNTER — Encounter (HOSPITAL_COMMUNITY): Payer: Self-pay | Admitting: Internal Medicine

## 2021-08-04 DIAGNOSIS — N186 End stage renal disease: Secondary | ICD-10-CM | POA: Diagnosis present

## 2021-08-04 DIAGNOSIS — I132 Hypertensive heart and chronic kidney disease with heart failure and with stage 5 chronic kidney disease, or end stage renal disease: Secondary | ICD-10-CM | POA: Diagnosis not present

## 2021-08-04 DIAGNOSIS — I5030 Unspecified diastolic (congestive) heart failure: Secondary | ICD-10-CM | POA: Diagnosis not present

## 2021-08-04 DIAGNOSIS — J810 Acute pulmonary edema: Secondary | ICD-10-CM | POA: Diagnosis not present

## 2021-08-04 DIAGNOSIS — E8770 Fluid overload, unspecified: Secondary | ICD-10-CM

## 2021-08-04 DIAGNOSIS — E876 Hypokalemia: Secondary | ICD-10-CM | POA: Diagnosis not present

## 2021-08-04 DIAGNOSIS — N2581 Secondary hyperparathyroidism of renal origin: Secondary | ICD-10-CM | POA: Diagnosis not present

## 2021-08-04 DIAGNOSIS — E8779 Other fluid overload: Secondary | ICD-10-CM | POA: Diagnosis not present

## 2021-08-04 DIAGNOSIS — D631 Anemia in chronic kidney disease: Secondary | ICD-10-CM | POA: Diagnosis not present

## 2021-08-04 DIAGNOSIS — Z992 Dependence on renal dialysis: Secondary | ICD-10-CM | POA: Diagnosis not present

## 2021-08-04 LAB — RENAL FUNCTION PANEL
Albumin: 3.1 g/dL — ABNORMAL LOW (ref 3.5–5.0)
Anion gap: 15 (ref 5–15)
BUN: 27 mg/dL — ABNORMAL HIGH (ref 8–23)
CO2: 24 mmol/L (ref 22–32)
Calcium: 8.4 mg/dL — ABNORMAL LOW (ref 8.9–10.3)
Chloride: 94 mmol/L — ABNORMAL LOW (ref 98–111)
Creatinine, Ser: 6.7 mg/dL — ABNORMAL HIGH (ref 0.44–1.00)
GFR, Estimated: 6 mL/min — ABNORMAL LOW (ref >60)
Glucose, Bld: 129 mg/dL — ABNORMAL HIGH (ref 70–99)
Phosphorus: 4.6 mg/dL (ref 2.5–4.6)
Potassium: 3.3 mmol/L — ABNORMAL LOW (ref 3.5–5.1)
Sodium: 133 mmol/L — ABNORMAL LOW (ref 135–145)

## 2021-08-04 LAB — HEMOGLOBIN A1C
Hgb A1c MFr Bld: 6.4 % — ABNORMAL HIGH (ref 4.8–5.6)
Mean Plasma Glucose: 136.98 mg/dL

## 2021-08-04 LAB — GASTROINTESTINAL PANEL BY PCR, STOOL (REPLACES STOOL CULTURE)

## 2021-08-04 LAB — GLUCOSE, CAPILLARY
Glucose-Capillary: 130 mg/dL — ABNORMAL HIGH (ref 70–99)
Glucose-Capillary: 141 mg/dL — ABNORMAL HIGH (ref 70–99)
Glucose-Capillary: 159 mg/dL — ABNORMAL HIGH (ref 70–99)

## 2021-08-04 LAB — CBC WITH DIFFERENTIAL/PLATELET
Abs Immature Granulocytes: 0.03 10*3/uL (ref 0.00–0.07)
Basophils Absolute: 0 10*3/uL (ref 0.0–0.1)
Basophils Relative: 1 %
Eosinophils Absolute: 0.4 10*3/uL (ref 0.0–0.5)
Eosinophils Relative: 6 %
HCT: 31.7 % — ABNORMAL LOW (ref 36.0–46.0)
Hemoglobin: 10.4 g/dL — ABNORMAL LOW (ref 12.0–15.0)
Immature Granulocytes: 1 %
Lymphocytes Relative: 29 %
Lymphs Abs: 1.9 10*3/uL (ref 0.7–4.0)
MCH: 31.7 pg (ref 26.0–34.0)
MCHC: 32.8 g/dL (ref 30.0–36.0)
MCV: 96.6 fL (ref 80.0–100.0)
Monocytes Absolute: 0.5 10*3/uL (ref 0.1–1.0)
Monocytes Relative: 9 %
Neutro Abs: 3.5 10*3/uL (ref 1.7–7.7)
Neutrophils Relative %: 54 %
Platelets: 226 10*3/uL (ref 150–400)
RBC: 3.28 MIL/uL — ABNORMAL LOW (ref 3.87–5.11)
RDW: 13.4 % (ref 11.5–15.5)
WBC: 6.4 10*3/uL (ref 4.0–10.5)
nRBC: 0 % (ref 0.0–0.2)

## 2021-08-04 LAB — HEPATITIS B SURFACE ANTIBODY,QUALITATIVE: Hep B S Ab: REACTIVE — AB

## 2021-08-04 LAB — C DIFFICILE QUICK SCREEN W PCR REFLEX
C Diff antigen: NEGATIVE
C Diff interpretation: NOT DETECTED
C Diff toxin: NEGATIVE

## 2021-08-04 LAB — HEPATITIS B SURFACE ANTIGEN: Hepatitis B Surface Ag: NONREACTIVE

## 2021-08-04 MED ORDER — SODIUM CHLORIDE 0.9 % IV SOLN: 100.0000 mL | INTRAVENOUS | Status: DC | PRN

## 2021-08-04 MED ORDER — LOPERAMIDE HCL 2 MG PO CAPS: 4.0000 mg | ORAL_CAPSULE | Freq: Three times a day (TID) | ORAL | 0 refills | Status: AC | PRN

## 2021-08-04 MED ORDER — HEPARIN SODIUM (PORCINE) 1000 UNIT/ML DIALYSIS: 2500.0000 [IU] | INTRAMUSCULAR | Status: DC | PRN

## 2021-08-04 MED ORDER — LIDOCAINE HCL (PF) 1 % IJ SOLN: 5.0000 mL | INTRAMUSCULAR | Status: DC | PRN

## 2021-08-04 MED ORDER — PENTAFLUOROPROP-TETRAFLUOROETH EX AERO: 1.0000 "application " | INHALATION_SPRAY | CUTANEOUS | Status: DC | PRN

## 2021-08-04 MED ORDER — LIDOCAINE-PRILOCAINE 2.5-2.5 % EX CREA: 1.0000 "application " | TOPICAL_CREAM | CUTANEOUS | Status: DC | PRN

## 2021-08-04 MED ORDER — ALTEPLASE 2 MG IJ SOLR: 2.0000 mg | Freq: Once | INTRAMUSCULAR | Status: DC | PRN

## 2021-08-04 MED ORDER — HEPARIN SODIUM (PORCINE) 1000 UNIT/ML DIALYSIS: 1000.0000 [IU] | INTRAMUSCULAR | Status: DC | PRN

## 2021-08-04 MED ORDER — POTASSIUM CHLORIDE CRYS ER 20 MEQ PO TBCR
40.0000 meq | EXTENDED_RELEASE_TABLET | Freq: Once | ORAL | Status: AC
  Administered 2021-08-04: 40 meq via ORAL
  Filled 2021-08-04: qty 2

## 2021-08-04 MED ORDER — LOPERAMIDE HCL 2 MG PO CAPS
4.0000 mg | ORAL_CAPSULE | Freq: Three times a day (TID) | ORAL | Status: DC | PRN
  Administered 2021-08-04 (×2): 4 mg via ORAL
  Filled 2021-08-04 (×2): qty 2

## 2021-08-04 NOTE — TOC Initial Note (Signed)
Transition of Care [facility]) - Initial/Assessment Note    Patient Details  Name: Destiny Day MRN: [id number] Date of Birth: 1951/02/02  Transition of [facility]) CM/SW Contact:    Zenon Mayo, RN Phone Number: 08/04/2021, 5:43 PM  Clinical Narrative:                 Patient is for dc today, she has no needs.  Expected Discharge Plan: Home/Self Care Barriers to Discharge: No Barriers Identified   Patient Goals and CMS Choice Patient states their goals for this hospitalization and ongoing recovery are:: return home   Choice offered to / list presented to : NA  Expected Discharge Plan and Services Expected Discharge Plan: Home/Self Care   Discharge Planning Services: CM Consult Post Acute Care Choice: NA   Expected Discharge Date: 08/04/21                 DME Agency: NA       HH Arranged: NA          Prior Living Arrangements/Services   Lives with:: Spouse Patient language and need for interpreter reviewed:: Yes Do you feel safe going back to the place where you live?: Yes      Need for Family Participation in Patient Care: Yes (Comment) Care giver support system in place?: Yes (comment)   Criminal Activity/Legal Involvement Pertinent to Current Situation/Hospitalization: No - Comment as needed  Activities of Daily Living Home Assistive Devices/Equipment: None ADL Screening (condition at time of admission) Patient's cognitive ability adequate to safely complete daily activities?: Yes Is the patient deaf or have difficulty hearing?: No Does the patient have difficulty seeing, even when wearing glasses/contacts?: No Does the patient have difficulty concentrating, remembering, or making decisions?: No Patient able to express need for assistance with ADLs?: Yes Does the patient have difficulty dressing or bathing?: No Independently performs ADLs?: Yes (appropriate for developmental age) Does the patient have difficulty walking or climbing stairs?:  Yes Weakness of Legs: Both Weakness of Arms/Hands: None  Permission Sought/Granted                  Emotional Assessment   Attitude/Demeanor/Rapport: Engaged Affect (typically observed): Appropriate Orientation: : Oriented to  Time, Oriented to Situation, Oriented to Place, Oriented to Self Alcohol / Substance Use: Not Applicable Psych Involvement: No (comment)  Admission diagnosis:  SOB (shortness of breath) [R06.02] Fluid overload [E87.70] Chest pain [R07.9] ESRD (end stage renal disease) ([place]) [N18.6] Patient Active Problem List   Diagnosis Date Noted   ESRD (end stage renal disease) ([place]) 08/04/2021   Fluid overload 08/03/2021   Prolonged QT interval 08/03/2021   Acute on chronic heart failure ([place]) 01/05/2021   Macular pucker, right eye 10/29/2020   Nausea with vomiting, unspecified 09/10/2020   Pseudophakia of left eye 07/02/2020   Diabetic macular edema of right eye with proliferative retinopathy associated with type 2 diabetes mellitus ([place]) 07/02/2020   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 type 2 diabetes mellitus ([place]) 04/23/2020   Allergy, unspecified, initial encounter 04/04/2020   Anaphylactic shock, unspecified, initial encounter 04/04/2020   Secondary hypertension, unspecified 12/13/2019   Other disorders of phosphorus metabolism 08/24/2019   Paroxysmal atrial fibrillation ([place]) 06/13/2019   Hypocalcemia 04/17/2019   Old bucket handle tear of lateral meniscus of right knee    Old peripheral tear of medial meniscus of right knee    Staphylococcal arthritis, unspecified joint ([place]) 05/11/2018   Septic arthritis of knee,  right ([place]) 05/06/2018   Degenerative tear of posterior horn of medial meniscus, right    Old complex tear of lateral meniscus of right knee    Loose body in knee, right    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[place]) 02/03/2018   End-stage renal disease on hemodialysis (HCC)     Respiratory failure ([place]) 01/21/2018   Achilles tendon contracture, right 11/09/2017   Labile blood glucose    Anemia of infection and chronic disease    Black stool    Right sided weakness    Subdural hemorrhage following injury 08/30/2017   Diabetic peripheral neuropathy ([place])    Chronic obstructive pulmonary disease ([place])    Subdural hematoma 08/24/2017   Traumatic subdural hematoma 08/23/2017   Fall    SDH (subdural hematoma)    Acute respiratory failure with hypoxia (HCC)    Diabetes mellitus type 2 in nonobese (HCC)    Leukocytosis    Labile blood pressure    Generalized anxiety disorder    Debility 08/16/2017   Amputated great toe of right foot ([place])    Amputated toe of left foot ([place])    Post-operative pain    ESRD on dialysis [facility])    Anxiety state    Acute blood loss anemia    Diarrhea, unspecified 01/16/2017   Dependence on renal dialysis ([place]) 01/06/2017   Idiopathic chronic venous hypertension of both lower extremities with inflammation 10/13/2016   S/P transmetatarsal amputation of foot, left ([place]) 09/21/2016   Onychomycosis 08/15/2016   Unspecified protein-calorie malnutrition ([place]) 07/29/2016   Other mechanical complication of femoral arterial graft (bypass), initial encounter ([place]) 07/14/2016   Other pneumonia, unspecified organism 07/11/2016   Age-related osteoporosis without current pathological fracture 07/11/2016   Cervical disc disorder, unspecified, unspecified cervical region 07/11/2016   Diverticulitis of intestine, part unspecified, without perforation or abscess without bleeding 07/11/2016   Unspecified abdominal hernia without obstruction or gangrene 07/11/2016   Gastro-esophageal reflux disease with esophagitis 07/11/2016   Primary generalized (osteo)arthritis 07/11/2016   Peripheral vascular disease ([place]) 07/08/2016   Cellulitis of left toe 06/29/2016   Atherosclerosis of native artery of left lower extremity with gangrene ([place]) 56/31/4970    Complication of vascular dialysis catheter 02/29/2016   Encounter for screening for other viral diseases 01/10/2016   Encounter for removal of sutures 10/29/2015   Other fluid overload 10/23/2015   Anemia in chronic kidney disease 10/16/2015   Essential (primary) hypertension 10/16/2015   Acute pulmonary edema ([place]) 10/16/2015   Coagulation defect, unspecified ([place]) 10/16/2015   Acute respiratory failure ([place]) 10/16/2015   Hyperlipidemia, unspecified 10/16/2015   Iron deficiency anemia, unspecified 10/16/2015   Pain, unspecified 10/16/2015   Pruritus, unspecified 10/16/2015   Secondary hyperparathyroidism of renal origin ([place]) 10/16/2015   Shortness of breath 10/16/2015   GERD (gastroesophageal reflux disease) 10/07/2015   ARF (acute renal failure) ([place])    Hypothermia 06/12/2015   End stage renal disease ([place]) 06/12/2015   Unspecified diastolic (congestive) heart failure ([place]) 07/30/2014   CKD (chronic kidney disease) stage 4, GFR 15-29 ml/min (HCC) 06/27/2014   Hypertensive heart disease 05/25/2013   CKD (chronic kidney disease) stage 3, GFR 30-59 ml/min (HCC) 05/25/2013   Hypertensive urgency 05/23/2013   Pulmonary edema 05/23/2013   Type 2 diabetes mellitus with diabetic chronic kidney disease ([place]) 05/23/2013   Type 2 diabetes mellitus with hyperosmolar nonketotic hyperglycemia ([place]) 04/13/2013   Acute on chronic diastolic heart failure ([place]) 04/13/2013   Gastrointestinal hemorrhage, unspecified 03/31/2013   Hypokalemia  08/24/2012   Hyperlipidemia associated with type 2 diabetes mellitus ([place]) 12/27/2007   Allergic rhinitis due to pollen 12/27/2007   Unspecified asthma, uncomplicated 25/27/1292   PCP:  Cipriano Mile, NP Pharmacy:   [id number] - [place], [place] [address] Phone: [phone] Fax: [phone]     Social Determinants of Health (SDOH) Interventions    Readmission Risk  Interventions Readmission Risk Prevention Plan 08/04/2021  Transportation Screening Complete  Medication Review ([place]) Complete  PCP or Specialist appointment within 3-5 days of discharge Complete  HRI or [place] Complete  SW Recovery Care/Counseling Consult Complete  [place] Not Applicable  Some recent data might be hidden

## 2021-08-04 NOTE — Care Management CC44 (Signed)
Condition Code 44 Documentation Completed  Patient Details  Name: Destiny Day MRN: [id number] Date of Birth: 1950-11-12   Condition Code 44 given:  Yes Patient signature on Condition Code 44 notice:  Yes Documentation of 2 MD's agreement:  Yes Code 44 added to claim:  Yes    Laurena Slimmer, RN 08/04/2021, 5:34 PM

## 2021-08-04 NOTE — Progress Notes (Signed)
D/C instructions reviewed with patient. Medications reviewed, all questions answered. IV removed, clean and intact. Son to escort pt home.  Clyde Canterbury, RN

## 2021-08-04 NOTE — Discharge Summary (Signed)
[facility]  Physician Discharge Summary   Patient ID: Destiny Day MRN: [id number] DOB/AGE: Dec 25, 1950 71 y.o.  Admit date: 08/03/2021 Discharge date: 08/04/2021    PCP: Cipriano Mile, NP  DISCHARGE DIAGNOSES:  Fluid overload in the setting of end-stage renal disease End-stage renal disease on hemodialysis Chronic diastolic CHF Hypertensive urgency, resolved Acute diarrhea, improved History of COPD   RECOMMENDATIONS FOR OUTPATIENT FOLLOW UP: Patient asked to resume her usual dialysis schedule from Wednesday  Home Health: None Equipment/Devices: None  CODE STATUS: Full code  DISCHARGE CONDITION: fair  Diet recommendation: As before  INITIAL HISTORY: Destiny Day is a 71 y.o. female with medical history significant of hypertension, hyperlipidemia, HFpEF, diabetes mellitus type 2, asthma, and ESRD on HD(M/W/F) presents with complaints of shortness of breath.  She had not missed any dialysis sessions but did have some dietary noncompliance recently drinking more fluids than she should have.  Hospitalized for further management.  Seen by nephrology.   Consultations: Nephrology  Procedures: Hemodialysis   HOSPITAL COURSE:   Fluid overload in the setting of diastolic CHF and end-stage renal disease Did not miss dialysis but may have had more fluids than she should have.  Chest x-ray showed pulmonary edema.  However she was noted to be saturating normal on room air.  She was noted to have elevated blood pressure presentation.  Looks like she required nitroglycerin infusion briefly.  It has been weaned off. Patient underwent hemodialysis after which she started feeling better.  Wants to go home.  Discussed with nephrology who would prefer to dialyze her again tomorrow.  This was discussed with patient who mentions that she will not stay in the hospital another night.  After further discussions with nephrology patient was discharged.  She will go for her usual  dialysis on Wednesday.  She was asked to come back to the hospital if symptoms recur.  End-stage renal disease on hemodialysis She is on a Monday Wednesday Friday schedule.    Hypertensive urgency Initially blood pressures were noted to be 226/112.  Was on nitroglycerin infusion.  Has been weaned off.  Resume home medications  Acute diarrhea Abdomen is benign.  C. difficile was negative.  GI pathogen panel is negative.  Imodium was given with improvement.  May take as needed at discharge.   Hypokalemia   History of COPD/asthma No acute exacerbation.  Continue home medications.  Diabetes mellitus type 2 with neuropathy Continue to monitor CBGs.  SSI.   Prolonged QT interval Avoid QT prolonging medications.  History of anxiety Continue BuSpar  Hyperlipidemia Continue statin  Status post bilateral transmetatarsal amputation Stable    Patient is stable.  Does not want to stay in the hospital another night.  Okay for discharge.   PERTINENT LABS:  The results of significant diagnostics from this hospitalization (including imaging, microbiology, ancillary and laboratory) are listed below for reference.    Microbiology: Recent Results (from the past 240 hour(s))  Resp Panel by RT-PCR (Flu A&B, Covid) Nasopharyngeal Swab     Status: None   Collection Time: 08/03/21  4:45 AM   Specimen: Nasopharyngeal Swab; Nasopharyngeal(NP) swabs in vial transport medium  Result Value Ref Range Status   SARS Coronavirus 2 by RT PCR NEGATIVE NEGATIVE Final    Comment: (NOTE) SARS-CoV-2 target nucleic acids are NOT DETECTED.  The SARS-CoV-2 RNA is generally detectable in upper respiratory specimens during the acute phase of infection. The lowest concentration of SARS-CoV-2 viral copies this assay can detect is 138 copies/mL.  A negative result does not preclude SARS-Cov-2 infection and should not be used as the sole basis for treatment or other patient management decisions. A negative  result may occur with  improper specimen collection/handling, submission of specimen other than nasopharyngeal swab, presence of viral mutation(s) within the areas targeted by this assay, and inadequate number of viral copies(<138 copies/mL). A negative result must be combined with clinical observations, patient history, and epidemiological information. The expected result is Negative.  Fact Sheet for Patients:  EntrepreneurPulse.com.au  Fact Sheet for Healthcare Providers:  IncredibleEmployment.be  This test is no t yet approved or cleared by the [place] FDA and  has been authorized for detection and/or diagnosis of SARS-CoV-2 by FDA under an Emergency Use Authorization (EUA). This EUA will remain  in effect (meaning this test can be used) for the duration of the COVID-19 declaration under Section 564(b)(1) of the Act, 21 U.S.C.section 360bbb-3(b)(1), unless the authorization is terminated  or revoked sooner.       Influenza A by PCR NEGATIVE NEGATIVE Final   Influenza B by PCR NEGATIVE NEGATIVE Final    Comment: (NOTE) The Xpert Xpress SARS-CoV-2/FLU/RSV plus assay is intended as an aid in the diagnosis of influenza from Nasopharyngeal swab specimens and should not be used as a sole basis for treatment. Nasal washings and aspirates are unacceptable for Xpert Xpress SARS-CoV-2/FLU/RSV testing.  Fact Sheet for Patients: EntrepreneurPulse.com.au  Fact Sheet for Healthcare Providers: IncredibleEmployment.be  This test is not yet approved or cleared by the [place] FDA and has been authorized for detection and/or diagnosis of SARS-CoV-2 by FDA under an Emergency Use Authorization (EUA). This EUA will remain in effect (meaning this test can be used) for the duration of the COVID-19 declaration under Section 564(b)(1) of the Act, 21 U.S.C. section 360bbb-3(b)(1), unless the authorization is  terminated or revoked.  Performed at [facility], [place] [address] [place], [place] [address]   Gastrointestinal Panel by PCR , Stool     Status: None   Collection Time: 08/04/21  2:15 AM   Specimen: Stool  Result Value Ref Range Status   Campylobacter species NOT DETECTED NOT DETECTED Final   Plesimonas shigelloides NOT DETECTED NOT DETECTED Final   Salmonella species NOT DETECTED NOT DETECTED Final   Yersinia enterocolitica NOT DETECTED NOT DETECTED Final   Vibrio species NOT DETECTED NOT DETECTED Final   Vibrio cholerae NOT DETECTED NOT DETECTED Final   Enteroaggregative E coli (EAEC) NOT DETECTED NOT DETECTED Final   Enteropathogenic E coli (EPEC) NOT DETECTED NOT DETECTED Final   Enterotoxigenic E coli (ETEC) NOT DETECTED NOT DETECTED Final   Shiga like toxin producing E coli (STEC) NOT DETECTED NOT DETECTED Final   Shigella/Enteroinvasive E coli (EIEC) NOT DETECTED NOT DETECTED Final   Cryptosporidium NOT DETECTED NOT DETECTED Final   Cyclospora cayetanensis NOT DETECTED NOT DETECTED Final   Entamoeba histolytica NOT DETECTED NOT DETECTED Final   Giardia lamblia NOT DETECTED NOT DETECTED Final   Adenovirus F40/41 NOT DETECTED NOT DETECTED Final   Astrovirus NOT DETECTED NOT DETECTED Final   Norovirus GI/GII NOT DETECTED NOT DETECTED Final   Rotavirus A NOT DETECTED NOT DETECTED Final   Sapovirus (I, II, IV, and V) NOT DETECTED NOT DETECTED Final    Comment: Performed at [facility], [address], [place], [place] [address]  C Difficile Quick Screen w PCR reflex     Status: None   Collection Time: 08/04/21  2:15 AM   Specimen: Stool  Result Value Ref Range Status   C Diff antigen NEGATIVE NEGATIVE Final   C Diff toxin NEGATIVE NEGATIVE Final   C Diff interpretation No C. difficile detected.  Final    Comment: Performed at [facility], [place] [address], [place], [place] [address]     Labs:  COVID-19 Labs  Lab Results  Component Value Date    SARSCOV2NAA NEGATIVE 08/03/2021   [place] NEGATIVE 03/09/2021   [place] NEGATIVE 01/05/2021   [place] NEGATIVE 09/03/2020      Basic Metabolic Panel: Recent Labs  Lab 08/03/21 0445 08/04/21 0346  NA 132* 133*  K 2.9* 3.3*  CL 92* 94*  CO2 27 24  GLUCOSE 137* 129*  BUN 18 27*  CREATININE 5.31* 6.70*  CALCIUM 9.5 8.4*  PHOS  --  4.6   Liver Function Tests: Recent Labs  Lab 08/04/21 0346  ALBUMIN 3.1*    CBC: Recent Labs  Lab 08/04/21 0346  WBC 6.4  NEUTROABS 3.5  HGB 10.4*  HCT 31.7*  MCV 96.6  PLT 226     CBG: Recent Labs  Lab 08/03/21 1529 08/03/21 2359 08/04/21 0615 08/04/21 1649  GLUCAP 229* 159* 130* 141*     IMAGING STUDIES DG Chest Port 1 View  Result Date: 08/03/2021 CLINICAL DATA:  Chest pain EXAM: PORTABLE CHEST 1 VIEW COMPARISON:  03/13/2021 FINDINGS: 0514 hours The cardio pericardial silhouette is enlarged. Vascular congestion noted with diffuse interstitial opacity suggesting edema. Bones are diffusely demineralized. Telemetry leads overlie the chest. IMPRESSION: Enlarged cardiopericardial silhouette with vascular congestion and diffuse interstitial opacity suggesting edema. Electronically Signed   By: Misty Stanley M.D.   On: 08/03/2021 05:39   Intravitreal Injection, Pharmacologic Agent - OS - Left Eye  Result Date: 07/17/2021 Time Out 07/17/2021. 10:45 AM. Confirmed correct patient, procedure, site, and patient consented. Anesthesia Topical anesthesia was used. Anesthetic medications included Lidocaine 4%. Procedure Preparation included Tobramycin 0.3%, 5% betadine to ocular surface, 10% betadine to eyelids. A 30 gauge needle was used. Injection: 2.5 mg bevacizumab 2.5 MG/0.1ML   Route: Intravitreal, Site: Left Eye   NDC: [phone], Lot: [id number] Post-op Post injection exam found visual acuity of at least counting fingers. The patient tolerated the procedure well. There were no complications. The patient received written and  verbal post procedure care education. Post injection medications were not given.   OCT, Retina - OU - Both Eyes  Result Date: 07/17/2021 Right Eye Quality was good. Scan locations included subfoveal. Central Foveal Thickness: 495. Findings include epiretinal membrane. Left Eye Quality was good. Central Foveal Thickness: 672. Progression has improved. Notes OS with overall improved CSME yet still center involved.  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  In no distress Resp: Clear to auscultation bilaterally.  Normal effort Cardio: S1-S2 is normal regular.  No S3-S4.  No rubs murmurs or bruit GI: Abdomen is soft.  Nontender nondistended.  Bowel sounds are  present normal.  No masses organomegaly    DISPOSITION: Home  Discharge Instructions     Diet - low sodium heart healthy   Complete by: As directed    Discharge instructions   Complete by: As directed    Please take your medications as prescribed. Please stick to your dialysis schedule.  You were cared for by a hospitalist during your hospital stay. If you have any questions about your discharge medications or the care you received while you were in the hospital after you are discharged, you can call the unit and asked to speak with the hospitalist on call if the hospitalist that took care of you is not available. Once you are discharged, your primary care physician will handle any further medical issues. Please note that NO REFILLS for  any discharge medications will be authorized once you are discharged, as it is imperative that you return to your primary care physician (or establish a relationship with a primary care physician if you do not have one) for your aftercare needs so that they can reassess your need for medications and monitor your lab values. If you do not have a primary care physician, you can call [phone] for a physician referral.   Increase activity slowly   Complete by: As directed          Allergies as of 08/04/2021       Reactions   Adhesive  [tape] Hives   Lisinopril Cough   Prednisone Swelling, Other (See Comments)   Excessive fluid buildup        Medication List     STOP taking these medications    docusate sodium 100 MG capsule Commonly known as: COLACE   famotidine 10 MG tablet Commonly known as: PEPCID   ipratropium 0.06 % nasal spray Commonly known as: ATROVENT       TAKE these medications    Advair HFA 115-21 MCG/ACT inhaler Generic drug: fluticasone-salmeterol Inhale two puffs twice daily   AeroChamber Plus inhaler Use with inhaler   albuterol 108 (90 Base) MCG/ACT inhaler Commonly known as: ProAir HFA Inhale 2 puffs into the lungs every 4 (four) hours as needed for wheezing or shortness of breath.   amLODipine 10 MG tablet Commonly known as: NORVASC Take 10 mg by mouth at bedtime.   aspirin EC 81 MG tablet Take 81 mg by mouth daily.   atorvastatin 80 MG tablet Commonly known as: LIPITOR Take 80 mg by mouth at bedtime.   busPIRone 7.5 MG tablet Commonly known as: BUSPAR Take 7.5 mg by mouth 2 (two) times daily as needed (anxiety).   carvedilol 12.5 MG tablet Commonly known as: COREG Take 12.5 mg by mouth See admin instructions. Take 1 tablet (12.5 mg) by mouth once daily in the evening on Mondays, Wednesdays, & Fridays. (Dialysis) Take 1 tablet (12.5 mg) by mouth twice daily on Sundays, Tuesdays, Thursdays, & Saturdays. (Non Dialysis)   cinacalcet 90  MG tablet Commonly known as: SENSIPAR Take 90 mg by mouth See admin instructions. Take 1 tablet (90 mg) by mouth in the evening on Mondays, Wednesdays, & Fridays (dialysis) Take 1 tablet (90 mg) by mouth twice daily on Sundays, Tuesdays, Thursdays, Saturdays. (non dialysis)   EPINEPHrine 0.3 mg/0.3 mL Soaj injection Commonly known as: Auvi-Q Inject 0.3 mg into the muscle as needed for anaphylaxis.   ferric citrate 1 GM 210 MG(Fe) tablet Commonly known as: AURYXIA Take 210-420 mg by mouth See admin instructions. Take 2 tablets (420 mg) by mouth  with each meal & take 1 tablet (210 mg) by mouth with snacks.   fluticasone 50 MCG/ACT nasal spray Commonly known as: FLONASE Place 2 sprays into both nostrils daily as needed for allergies or rhinitis.   gabapentin 100 MG capsule Commonly known as: NEURONTIN TAKE 1 CAPSULE (100 MG TOTAL) BY MOUTH AT BEDTIME. WHEN NECESSARY FOR NEUROPATHY PAIN What changed:  when to take this reasons to take this   glipiZIDE 5 MG tablet Commonly known as: GLUCOTROL Take 5 mg by mouth daily before breakfast.   guaiFENesin-codeine 100-10 MG/5ML syrup Take 5 mLs by mouth 3 (three) times daily as needed for cough.   isosorbide-hydrALAZINE 20-37.5 MG tablet Commonly known as: BiDil Take 1 tablet by mouth 3 (three) times daily. BID on days of dialysis What changed:  when to take this additional instructions   lidocaine-prilocaine cream Commonly known as: EMLA Apply 1 application topically 3 (three) times a week. 1-2 hours prior to dialysis   loperamide 2 MG capsule Commonly known as: IMODIUM Take 2 capsules (4 mg total) by mouth 3 (three) times daily as needed for diarrhea or loose stools.   montelukast 10 MG tablet Commonly known as: SINGULAIR TAKE 1 TABLET BY MOUTH EVERYDAY AT BEDTIME   omeprazole 20 MG capsule Commonly known as: PRILOSEC Take 20 mg by mouth daily.   ondansetron 4 MG tablet Commonly known as: ZOFRAN Take 1 tablet (4 mg total)  by mouth every 6 (six) hours as needed for nausea.   polyethylene glycol 17 g packet Commonly known as: MIRALAX / GLYCOLAX Take 17 g by mouth daily. What changed:  when to take this reasons to take this   traZODone 50 MG tablet Commonly known as: DESYREL Take 50 mg by mouth at bedtime as needed for sleep.   Vitamin D (Ergocalciferol) 1.25 MG (50000 UNIT) Caps capsule Commonly known as: DRISDOL Take 50,000 Units by mouth every Sunday. Takes on Sunday          Follow-up Information     Cipriano Mile, NP. Schedule an appointment as soon as possible for a visit in 1 week(s).   Contact information: [place] [address] [phone]                 TOTAL DISCHARGE TIME: 68 minutes  [facility] Pager on [email or web address]  08/05/2021, 10:56 AM

## 2021-08-04 NOTE — Care Management Obs Status (Signed)
[facility] NOTIFICATION   Patient Details  Name: Destiny Day MRN: [id number] Date of Birth: 04/30/1951   Medicare Observation Status Notification Given:       Laurena Slimmer, RN 08/04/2021, 5:34 PM

## 2021-08-04 NOTE — Progress Notes (Addendum)
[facility] Progress Note   Subjective:Seen on HD. BP better controlled, denies SOB. Able to sing. UF as tolerated.     Objective Vitals:   08/03/21 2046 08/03/21 2357 08/04/21 0321 08/04/21 0726  BP:  (!) 158/66 (!) 147/70   Pulse:  90 94   Resp:  18 20   Temp:  98.7 F (37.1 C) 98.4 F (36.9 C) 98.3 F (36.8 C)  TempSrc:  Oral Oral Oral  SpO2: 100% 100% 99%   Weight:   65.7 kg   Height:       Physical Exam General: Pleasant older female in NAD Heart: S1,S2 No M/R/G Lungs: Bilateral breath sounds clear anteriorly.  Abdomen: NABS, NT Extremities: Bilateral transmets, no LE edema Dialysis Access:  AVG Cannulated.   Additional Objective Labs: Basic Metabolic Panel: Recent Labs  Lab 08/03/21 0445 08/04/21 0346  NA 132* 133*  K 2.9* 3.3*  CL 92* 94*  CO2 27 24  GLUCOSE 137* 129*  BUN 18 27*  CREATININE 5.31* 6.70*  CALCIUM 9.5 8.4*  PHOS  --  4.6   Liver Function Tests: Recent Labs  Lab 08/04/21 0346  ALBUMIN 3.1*   No results for input(s): LIPASE, AMYLASE in the last 168 hours. CBC: Recent Labs  Lab 08/04/21 0346  WBC 6.4  NEUTROABS 3.5  HGB 10.4*  HCT 31.7*  MCV 96.6  PLT 226   Blood Culture    Component Value Date/Time   SDES BLOOD RIGHT HAND 05/17/2019 1940   SPECREQUEST  05/17/2019 1940    BOTTLES DRAWN AEROBIC AND ANAEROBIC Blood Culture adequate volume   CULT  05/17/2019 1940    NO GROWTH 5 DAYS Performed at [facility], [place] [address] [place], [place] [address]    REPTSTATUS 05/22/2019 FINAL 05/17/2019 1940    Cardiac Enzymes: No results for input(s): CKTOTAL, CKMB, CKMBINDEX, TROPONINI in the last 168 hours. CBG: Recent Labs  Lab 08/03/21 1529 08/03/21 2359 08/04/21 0615  GLUCAP 229* 159* 130*   Iron Studies: No results for input(s): IRON, TIBC, TRANSFERRIN, FERRITIN in the last 72 hours. @lablastinr3 @ Studies/Results: DG Chest Port 1 View  Result Date: 08/03/2021 CLINICAL DATA:  Chest pain EXAM:  PORTABLE CHEST 1 VIEW COMPARISON:  03/13/2021 FINDINGS: 0514 hours The cardio pericardial silhouette is enlarged. Vascular congestion noted with diffuse interstitial opacity suggesting edema. Bones are diffusely demineralized. Telemetry leads overlie the chest. IMPRESSION: Enlarged cardiopericardial silhouette with vascular congestion and diffuse interstitial opacity suggesting edema. Electronically Signed   By: Misty Stanley M.D.   On: 08/03/2021 05:39   Medications:  sodium chloride     sodium chloride      amLODipine  10 mg Oral QHS   aspirin EC  81 mg Oral Daily   atorvastatin  80 mg Oral QHS   carvedilol  12.5 mg Oral Q M,W,F   And   carvedilol  12.5 mg Oral 2 times per day on Sun Tue Thu Sat   Chlorhexidine Gluconate Cloth  6 each Topical Q0600   cinacalcet  90 mg Oral Once per day on Sun Tue Thu Sat   And   cinacalcet  90 mg Oral Once per day on Sun Tue Thu Sat   ferric citrate  420 mg Oral TID WC   heparin  5,000 Units Subcutaneous Q8H   insulin aspart  0-6 Units Subcutaneous TID WC   isosorbide-hydrALAZINE  1 tablet Oral Once per day on Mon Wed Fri   isosorbide-hydrALAZINE  1 tablet Oral Once per  day on Sun Tue Thu Sat   lidocaine-prilocaine  1 application Topical Once per day on Mon Wed Fri   mometasone-formoterol  2 puff Inhalation BID   montelukast  10 mg Oral QHS   sodium chloride flush  3 mL Intravenous Q12H   MWF - [facility]  4h  400/ 600  64kg  2K/ 3.5Ca  LUA AVG Hep 2500+ 1073midrun Mircera 30 mcg IV q4wks - last 07/09/21 Hectorol 40mcg IV qHD    Assessment/Plan:  Volume overload/pulmonary edema - CXR with pulmonary edema.  Has not missed HD but did get under dry weight last HD.  Admits to increased PO fluid intake.  Possible weight loss,challenge EDW and UF as tolerated.  Lower EDW as tolerated.  Hypokalemia - K 3.3 on admits. 4 K bath. Give K Dur 40 MEQ X 1 dose.  ESRD -  on HD MWF.  HD today on schedule. Next HD 08/06/2021.    Hypertension -BP improved, off NTG gtt.  No peripheral edema. Resume home meds.   Anemia of CKD - HGB 10.4  Secondary Hyperparathyroidism -  Ca/PO4 at goal. Continue home meds.   Nutrition - Renal diet w/fluid restrictions.  HFpEF 55-60% - volume management with dialysis  Rita H. Brown NP-C 08/04/2021, 9:05 AM  [facility] [phone]  Pt seen, examined and agree w A/P as above. 3 L off today and close to dry wt. Needs further inpatient HD, will plan for 3hr HD tomorrow to lower volume more.  Kelly Splinter  MD 08/04/2021, 5:10 PM

## 2021-08-04 NOTE — Progress Notes (Incomplete)
[facility] PROGRESS NOTE   Destiny Day IPJ:[id number] DOB: [birth year] DOA: 08/03/2021  PCP: Cipriano Mile, NP  Brief History/Interval Summary: Destiny Day is a 71 y.o. female with medical history significant of hypertension, hyperlipidemia, HFpEF, diabetes mellitus type 2, asthma, and ESRD on HD(M/W/F) presents with complaints of shortness of breath.  She had not missed any dialysis sessions but did have some dietary noncompliance recently drinking more fluids than she should have.  Hospitalized for further management.  Seen by nephrology.   Reason for Visit: Fluid overload  Consultants: Nephrology  Procedures: Hemodialysis today    Subjective/Interval History: Denies any chest pain this morning.  Shortness of breath has not worsened overnight.  No nausea vomiting.  Does report diarrhea but denies any abdominal pain.     Assessment/Plan:  Fluid overload in the setting of diastolic CHF and end-stage renal disease Did not miss dialysis but may have had more fluids than she should have.  Chest x-ray showed pulmonary edema.  However she was noted to be saturating normal on room air.  She was noted to have elevated blood pressure presentation.  Looks like she required nitroglycerin infusion briefly.  It has been weaned off. Plan is for dialysis today.  End-stage renal disease on hemodialysis She is on a Monday Wednesday Friday schedule.  To be dialyzed today.  Hypertensive urgency Initially blood pressures were noted to be 226/112.  Was on nitroglycerin infusion.  Has been weaned off.  Continue antihypertensives.  Blood pressure is noted to be better.  Acute diarrhea Abdomen is benign.  C. difficile was negative.  GI pathogen panel is pending.  We will give Imodium as needed.  Denies any blood in the stool.  Hypokalemia To be addressed with dialysis.  History of COPD/asthma No acute exacerbation.  Continue home medications.  Diabetes mellitus type 2 with  neuropathy Continue to monitor CBGs.  SSI.  Prolonged QT interval Avoid QT prolonging medications.  History of anxiety Continue BuSpar  Hyperlipidemia Continue statin  Status post bilateral transmetatarsal amputation Stable   DVT Prophylaxis: Subcutaneous heparin Code Status: Full code Family Communication: Discussed with patient Disposition Plan: Hopefully return home once improved  Status is: Inpatient  {Inpatient:23812}      Medications: Scheduled:  amLODipine  10 mg Oral QHS   aspirin EC  81 mg Oral Daily   atorvastatin  80 mg Oral QHS   carvedilol  12.5 mg Oral Q M,W,F   And   carvedilol  12.5 mg Oral 2 times per day on Sun Tue Thu Sat   Chlorhexidine Gluconate Cloth  6 each Topical Q0600   cinacalcet  90 mg Oral Once per day on Sun Tue Thu Sat   And   cinacalcet  90 mg Oral Once per day on Sun Tue Thu Sat   ferric citrate  420 mg Oral TID WC   heparin  5,000 Units Subcutaneous Q8H   insulin aspart  0-6 Units Subcutaneous TID WC   isosorbide-hydrALAZINE  1 tablet Oral Once per day on Mon Wed Fri   isosorbide-hydrALAZINE  1 tablet Oral Once per day on Sun Tue Thu Sat   lidocaine-prilocaine  1 application Topical Once per day on Mon Wed Fri   mometasone-formoterol  2 puff Inhalation BID   montelukast  10 mg Oral QHS   potassium chloride  40 mEq Oral Once   sodium chloride flush  3 mL Intravenous Q12H   Continuous:  sodium chloride     sodium chloride  YSA:YTKZSW chloride, sodium chloride, acetaminophen **OR** acetaminophen, albuterol, alteplase, busPIRone, cinacalcet, ferric citrate, fluticasone, gabapentin, guaiFENesin-codeine, heparin, [START ON 08/05/2021] heparin, lidocaine (PF), lidocaine-prilocaine, loperamide, pentafluoroprop-tetrafluoroeth, polyethylene glycol, trimethobenzamide  Antibiotics: Anti-infectives (From admission, onward)    None       Objective:  Vital Signs  Vitals:   08/04/21 1100 08/04/21 1130 08/04/21 1200 08/04/21  1230  BP: (!) 164/64 (!) 116/58 140/70 (!) 152/60  Pulse: 86 89 83 95  Resp:      Temp:      TempSrc:      SpO2:      Weight:      Height:        Intake/Output Summary (Last 24 hours) at 08/04/2021 1257 Last data filed at 08/04/2021 0820 Gross per 24 hour  Intake 285.82 ml  Output --  Net 285.82 ml   Filed Weights   08/03/21 0405 08/04/21 0321 08/04/21 0843  Weight: 65.8 kg 65.7 kg 66.5 kg    General appearance: Awake alert.  In no distress Resp: Mildly tachypneic.  Crackles bilaterally at the bases.  No wheezing or rhonchi. Cardio: S1-S2 is normal regular.  No S3-S4.  No rubs murmurs or bruit GI: Abdomen is soft.  Nontender nondistended.  Bowel sounds are present normal.  No masses organomegaly Extremities: No edema.  Full range of motion of lower extremities. Neurologic: Alert and oriented x3.  No focal neurological deficits.    Lab Results:  Data Reviewed: I have personally reviewed following labs and imaging studies  CBC: Recent Labs  Lab 08/04/21 0346  WBC 6.4  NEUTROABS 3.5  HGB 10.4*  HCT 31.7*  MCV 96.6  PLT 109    Basic Metabolic Panel: Recent Labs  Lab 08/03/21 0445 08/04/21 0346  NA 132* 133*  K 2.9* 3.3*  CL 92* 94*  CO2 27 24  GLUCOSE 137* 129*  BUN 18 27*  CREATININE 5.31* 6.70*  CALCIUM 9.5 8.4*  PHOS  --  4.6    GFR: Estimated Creatinine Clearance: 7 mL/min (A) (by C-G formula based on SCr of 6.7 mg/dL (H)).  Liver Function Tests: Recent Labs  Lab 08/04/21 0346  ALBUMIN 3.1*      HbA1C: Recent Labs    08/04/21 0346  HGBA1C 6.4*    CBG: Recent Labs  Lab 08/03/21 1529 08/03/21 2359 08/04/21 0615  GLUCAP 229* 159* 130*     Recent Results (from the past 240 hour(s))  Resp Panel by RT-PCR (Flu A&B, Covid) Nasopharyngeal Swab     Status: None   Collection Time: 08/03/21  4:45 AM   Specimen: Nasopharyngeal Swab; Nasopharyngeal(NP) swabs in vial transport medium  Result Value Ref Range Status   SARS Coronavirus 2 by  RT PCR NEGATIVE NEGATIVE Final    Comment: (NOTE) SARS-CoV-2 target nucleic acids are NOT DETECTED.  The SARS-CoV-2 RNA is generally detectable in upper respiratory specimens during the acute phase of infection. The lowest concentration of SARS-CoV-2 viral copies this assay can detect is 138 copies/mL. A negative result does not preclude SARS-Cov-2 infection and should not be used as the sole basis for treatment or other patient management decisions. A negative result may occur with  improper specimen collection/handling, submission of specimen other than nasopharyngeal swab, presence of viral mutation(s) within the areas targeted by this assay, and inadequate number of viral copies(<138 copies/mL). A negative result must be combined with clinical observations, patient history, and epidemiological information. The expected result is Negative.  Fact Sheet for Patients:  EntrepreneurPulse.com.au  Fact Sheet  for Healthcare Providers:  IncredibleEmployment.be  This test is no t yet approved or cleared by the [place] and  has been authorized for detection and/or diagnosis of SARS-CoV-2 by FDA under an Emergency Use Authorization (EUA). This EUA will remain  in effect (meaning this test can be used) for the duration of the COVID-19 declaration under Section 564(b)(1) of the Act, 21 U.S.C.section 360bbb-3(b)(1), unless the authorization is terminated  or revoked sooner.       Influenza A by PCR NEGATIVE NEGATIVE Final   Influenza B by PCR NEGATIVE NEGATIVE Final    Comment: (NOTE) The Xpert Xpress SARS-CoV-2/FLU/RSV plus assay is intended as an aid in the diagnosis of influenza from Nasopharyngeal swab specimens and should not be used as a sole basis for treatment. Nasal washings and aspirates are unacceptable for Xpert Xpress SARS-CoV-2/FLU/RSV testing.  Fact Sheet for Patients: EntrepreneurPulse.com.au  Fact Sheet  for Healthcare Providers: IncredibleEmployment.be  This test is not yet approved or cleared by the [place] FDA and has been authorized for detection and/or diagnosis of SARS-CoV-2 by FDA under an Emergency Use Authorization (EUA). This EUA will remain in effect (meaning this test can be used) for the duration of the COVID-19 declaration under Section 564(b)(1) of the Act, 21 U.S.C. section 360bbb-3(b)(1), unless the authorization is terminated or revoked.  Performed at [facility], [place] [address], [place], [place] [address]   Gastrointestinal Panel by PCR , Stool     Status: None   Collection Time: 08/04/21  2:15 AM   Specimen: Stool  Result Value Ref Range Status   Campylobacter species NOT DETECTED NOT DETECTED Final   Plesimonas shigelloides NOT DETECTED NOT DETECTED Final   Salmonella species NOT DETECTED NOT DETECTED Final   Yersinia enterocolitica NOT DETECTED NOT DETECTED Final   Vibrio species NOT DETECTED NOT DETECTED Final   Vibrio cholerae NOT DETECTED NOT DETECTED Final   Enteroaggregative E coli (EAEC) NOT DETECTED NOT DETECTED Final   Enteropathogenic E coli (EPEC) NOT DETECTED NOT DETECTED Final   Enterotoxigenic E coli (ETEC) NOT DETECTED NOT DETECTED Final   Shiga like toxin producing E coli (STEC) NOT DETECTED NOT DETECTED Final   Shigella/Enteroinvasive E coli (EIEC) NOT DETECTED NOT DETECTED Final   Cryptosporidium NOT DETECTED NOT DETECTED Final   Cyclospora cayetanensis NOT DETECTED NOT DETECTED Final   Entamoeba histolytica NOT DETECTED NOT DETECTED Final   Giardia lamblia NOT DETECTED NOT DETECTED Final   Adenovirus F40/41 NOT DETECTED NOT DETECTED Final   Astrovirus NOT DETECTED NOT DETECTED Final   Norovirus GI/GII NOT DETECTED NOT DETECTED Final   Rotavirus A NOT DETECTED NOT DETECTED Final   Sapovirus (I, II, IV, and V) NOT DETECTED NOT DETECTED Final    Comment: Performed at [facility], [place]  [place], [place] [address]  C Difficile Quick Screen w PCR reflex     Status: None   Collection Time: 08/04/21  2:15 AM   Specimen: Stool  Result Value Ref Range Status   C Diff antigen NEGATIVE NEGATIVE Final   C Diff toxin NEGATIVE NEGATIVE Final   C Diff interpretation No C. difficile detected.  Final    Comment: Performed at [facility], [place] [address] [place], [place] [address]      Radiology Studies: DG Chest Port 1 View  Result Date: 08/03/2021 CLINICAL DATA:  Chest pain EXAM: PORTABLE CHEST 1 VIEW COMPARISON:  03/13/2021 FINDINGS: 0514 hours The cardio pericardial silhouette is enlarged.  Vascular congestion noted with diffuse interstitial opacity suggesting edema. Bones are diffusely demineralized. Telemetry leads overlie the chest. IMPRESSION: Enlarged cardiopericardial silhouette with vascular congestion and diffuse interstitial opacity suggesting edema. Electronically Signed   By: Misty Stanley M.D.   On: 08/03/2021 05:39       LOS: 1 day   [facility] Pager on [email or web address]  08/04/2021, 12:57 PM

## 2021-08-05 ENCOUNTER — Telehealth: Payer: Self-pay | Admitting: Physician Assistant

## 2021-08-05 ENCOUNTER — Encounter (INDEPENDENT_AMBULATORY_CARE_PROVIDER_SITE_OTHER): Payer: Medicare Other | Admitting: Ophthalmology

## 2021-08-05 ENCOUNTER — Encounter (INDEPENDENT_AMBULATORY_CARE_PROVIDER_SITE_OTHER): Payer: Self-pay

## 2021-08-05 LAB — HEPATITIS B SURFACE ANTIBODY, QUANTITATIVE: Hep B S AB Quant (Post): 70.6 m[IU]/mL (ref >9.9)

## 2021-08-05 NOTE — Telephone Encounter (Signed)
Transition of care contact from inpatient facility  Date of Discharge: 08/04/21 Date of Contact: 08/05/21 Method of contact: Phone  Attempted to contact patient to discuss transition of care from inpatient admission. Patient did not answer the phone. Message was left on the patient's voicemail with call back  instructions.   Anice Paganini, PA-C 08/05/2021, 1:29 PM  [employer]

## 2021-08-06 DIAGNOSIS — N2581 Secondary hyperparathyroidism of renal origin: Secondary | ICD-10-CM | POA: Diagnosis not present

## 2021-08-06 DIAGNOSIS — N186 End stage renal disease: Secondary | ICD-10-CM | POA: Diagnosis not present

## 2021-08-06 DIAGNOSIS — D631 Anemia in chronic kidney disease: Secondary | ICD-10-CM | POA: Diagnosis not present

## 2021-08-06 DIAGNOSIS — T782XXA Anaphylactic shock, unspecified, initial encounter: Secondary | ICD-10-CM | POA: Diagnosis not present

## 2021-08-06 DIAGNOSIS — Z992 Dependence on renal dialysis: Secondary | ICD-10-CM | POA: Diagnosis not present

## 2021-08-06 DIAGNOSIS — D509 Iron deficiency anemia, unspecified: Secondary | ICD-10-CM | POA: Diagnosis not present

## 2021-08-06 DIAGNOSIS — E1122 Type 2 diabetes mellitus with diabetic chronic kidney disease: Secondary | ICD-10-CM | POA: Diagnosis not present

## 2021-08-07 DIAGNOSIS — Z79899 Other long term (current) drug therapy: Secondary | ICD-10-CM | POA: Diagnosis not present

## 2021-08-07 DIAGNOSIS — E113599 Type 2 diabetes mellitus with proliferative diabetic retinopathy without macular edema, unspecified eye: Secondary | ICD-10-CM | POA: Diagnosis not present

## 2021-08-07 DIAGNOSIS — I70209 Unspecified atherosclerosis of native arteries of extremities, unspecified extremity: Secondary | ICD-10-CM | POA: Diagnosis not present

## 2021-08-07 DIAGNOSIS — I509 Heart failure, unspecified: Secondary | ICD-10-CM | POA: Diagnosis not present

## 2021-08-07 DIAGNOSIS — N186 End stage renal disease: Secondary | ICD-10-CM | POA: Diagnosis not present

## 2021-08-07 DIAGNOSIS — I132 Hypertensive heart and chronic kidney disease with heart failure and with stage 5 chronic kidney disease, or end stage renal disease: Secondary | ICD-10-CM | POA: Diagnosis not present

## 2021-08-07 DIAGNOSIS — I739 Peripheral vascular disease, unspecified: Secondary | ICD-10-CM | POA: Diagnosis not present

## 2021-08-07 DIAGNOSIS — E1122 Type 2 diabetes mellitus with diabetic chronic kidney disease: Secondary | ICD-10-CM | POA: Diagnosis not present

## 2021-08-07 DIAGNOSIS — Z7189 Other specified counseling: Secondary | ICD-10-CM | POA: Diagnosis not present

## 2021-08-07 DIAGNOSIS — E213 Hyperparathyroidism, unspecified: Secondary | ICD-10-CM | POA: Diagnosis not present

## 2021-08-07 DIAGNOSIS — I480 Paroxysmal atrial fibrillation: Secondary | ICD-10-CM | POA: Diagnosis not present

## 2021-08-07 DIAGNOSIS — J309 Allergic rhinitis, unspecified: Secondary | ICD-10-CM | POA: Diagnosis not present

## 2021-08-08 DIAGNOSIS — D509 Iron deficiency anemia, unspecified: Secondary | ICD-10-CM | POA: Diagnosis not present

## 2021-08-08 DIAGNOSIS — T782XXA Anaphylactic shock, unspecified, initial encounter: Secondary | ICD-10-CM | POA: Diagnosis not present

## 2021-08-08 DIAGNOSIS — Z992 Dependence on renal dialysis: Secondary | ICD-10-CM | POA: Diagnosis not present

## 2021-08-08 DIAGNOSIS — N186 End stage renal disease: Secondary | ICD-10-CM | POA: Diagnosis not present

## 2021-08-08 DIAGNOSIS — D631 Anemia in chronic kidney disease: Secondary | ICD-10-CM | POA: Diagnosis not present

## 2021-08-08 DIAGNOSIS — N2581 Secondary hyperparathyroidism of renal origin: Secondary | ICD-10-CM | POA: Diagnosis not present

## 2021-08-11 DIAGNOSIS — D631 Anemia in chronic kidney disease: Secondary | ICD-10-CM | POA: Diagnosis not present

## 2021-08-11 DIAGNOSIS — Z992 Dependence on renal dialysis: Secondary | ICD-10-CM | POA: Diagnosis not present

## 2021-08-11 DIAGNOSIS — N2581 Secondary hyperparathyroidism of renal origin: Secondary | ICD-10-CM | POA: Diagnosis not present

## 2021-08-11 DIAGNOSIS — N186 End stage renal disease: Secondary | ICD-10-CM | POA: Diagnosis not present

## 2021-08-11 DIAGNOSIS — T782XXA Anaphylactic shock, unspecified, initial encounter: Secondary | ICD-10-CM | POA: Diagnosis not present

## 2021-08-11 DIAGNOSIS — D509 Iron deficiency anemia, unspecified: Secondary | ICD-10-CM | POA: Diagnosis not present

## 2021-08-12 ENCOUNTER — Ambulatory Visit (INDEPENDENT_AMBULATORY_CARE_PROVIDER_SITE_OTHER): Payer: Medicare Other

## 2021-08-12 DIAGNOSIS — J309 Allergic rhinitis, unspecified: Secondary | ICD-10-CM | POA: Diagnosis not present

## 2021-08-13 DIAGNOSIS — N186 End stage renal disease: Secondary | ICD-10-CM | POA: Diagnosis not present

## 2021-08-13 DIAGNOSIS — N2581 Secondary hyperparathyroidism of renal origin: Secondary | ICD-10-CM | POA: Diagnosis not present

## 2021-08-13 DIAGNOSIS — Z992 Dependence on renal dialysis: Secondary | ICD-10-CM | POA: Diagnosis not present

## 2021-08-13 DIAGNOSIS — D509 Iron deficiency anemia, unspecified: Secondary | ICD-10-CM | POA: Diagnosis not present

## 2021-08-13 DIAGNOSIS — T782XXA Anaphylactic shock, unspecified, initial encounter: Secondary | ICD-10-CM | POA: Diagnosis not present

## 2021-08-13 DIAGNOSIS — D631 Anemia in chronic kidney disease: Secondary | ICD-10-CM | POA: Diagnosis not present

## 2021-08-15 DIAGNOSIS — T782XXA Anaphylactic shock, unspecified, initial encounter: Secondary | ICD-10-CM | POA: Diagnosis not present

## 2021-08-15 DIAGNOSIS — D509 Iron deficiency anemia, unspecified: Secondary | ICD-10-CM | POA: Diagnosis not present

## 2021-08-15 DIAGNOSIS — N2581 Secondary hyperparathyroidism of renal origin: Secondary | ICD-10-CM | POA: Diagnosis not present

## 2021-08-15 DIAGNOSIS — N186 End stage renal disease: Secondary | ICD-10-CM | POA: Diagnosis not present

## 2021-08-15 DIAGNOSIS — Z992 Dependence on renal dialysis: Secondary | ICD-10-CM | POA: Diagnosis not present

## 2021-08-15 DIAGNOSIS — D631 Anemia in chronic kidney disease: Secondary | ICD-10-CM | POA: Diagnosis not present

## 2021-08-18 DIAGNOSIS — N2581 Secondary hyperparathyroidism of renal origin: Secondary | ICD-10-CM | POA: Diagnosis not present

## 2021-08-18 DIAGNOSIS — N186 End stage renal disease: Secondary | ICD-10-CM | POA: Diagnosis not present

## 2021-08-18 DIAGNOSIS — Z992 Dependence on renal dialysis: Secondary | ICD-10-CM | POA: Diagnosis not present

## 2021-08-18 DIAGNOSIS — T782XXA Anaphylactic shock, unspecified, initial encounter: Secondary | ICD-10-CM | POA: Diagnosis not present

## 2021-08-18 DIAGNOSIS — D509 Iron deficiency anemia, unspecified: Secondary | ICD-10-CM | POA: Diagnosis not present

## 2021-08-18 DIAGNOSIS — D631 Anemia in chronic kidney disease: Secondary | ICD-10-CM | POA: Diagnosis not present

## 2021-08-20 DIAGNOSIS — D631 Anemia in chronic kidney disease: Secondary | ICD-10-CM | POA: Diagnosis not present

## 2021-08-20 DIAGNOSIS — N2581 Secondary hyperparathyroidism of renal origin: Secondary | ICD-10-CM | POA: Diagnosis not present

## 2021-08-20 DIAGNOSIS — D509 Iron deficiency anemia, unspecified: Secondary | ICD-10-CM | POA: Diagnosis not present

## 2021-08-20 DIAGNOSIS — N186 End stage renal disease: Secondary | ICD-10-CM | POA: Diagnosis not present

## 2021-08-20 DIAGNOSIS — Z992 Dependence on renal dialysis: Secondary | ICD-10-CM | POA: Diagnosis not present

## 2021-08-20 DIAGNOSIS — T782XXA Anaphylactic shock, unspecified, initial encounter: Secondary | ICD-10-CM | POA: Diagnosis not present

## 2021-08-21 ENCOUNTER — Encounter (INDEPENDENT_AMBULATORY_CARE_PROVIDER_SITE_OTHER): Payer: Self-pay | Admitting: Ophthalmology

## 2021-08-21 ENCOUNTER — Other Ambulatory Visit: Payer: Self-pay

## 2021-08-21 ENCOUNTER — Encounter (INDEPENDENT_AMBULATORY_CARE_PROVIDER_SITE_OTHER): Payer: Medicare Other | Admitting: Ophthalmology

## 2021-08-21 ENCOUNTER — Ambulatory Visit (INDEPENDENT_AMBULATORY_CARE_PROVIDER_SITE_OTHER): Payer: Medicare Other | Admitting: Ophthalmology

## 2021-08-21 DIAGNOSIS — E113512 Type 2 diabetes mellitus with proliferative diabetic retinopathy with macular edema, left eye: Secondary | ICD-10-CM

## 2021-08-21 DIAGNOSIS — E113511 Type 2 diabetes mellitus with proliferative diabetic retinopathy with macular edema, right eye: Secondary | ICD-10-CM | POA: Diagnosis not present

## 2021-08-21 DIAGNOSIS — H35371 Puckering of macula, right eye: Secondary | ICD-10-CM

## 2021-08-21 DIAGNOSIS — E113521 Type 2 diabetes mellitus with proliferative diabetic retinopathy with traction retinal detachment involving the macula, right eye: Secondary | ICD-10-CM | POA: Diagnosis not present

## 2021-08-21 MED ORDER — BEVACIZUMAB 2.5 MG/0.1ML IZ SOSY
2.5000 mg | PREFILLED_SYRINGE | INTRAVITREAL | Status: AC | PRN
  Administered 2021-08-21: 2.5 mg via INTRAVITREAL

## 2021-08-21 NOTE — Assessment & Plan Note (Signed)
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

## 2021-08-21 NOTE — Progress Notes (Addendum)
08/21/2021     CHIEF COMPLAINT Patient presents for  Chief Complaint  Patient presents with   Retina Follow Up      HISTORY OF PRESENT ILLNESS: Destiny Day is a 71 y.o. female who presents to the clinic today for:   HPI     Retina Follow Up           Diagnosis: Diabetic Retinopathy   Laterality: right eye   Onset: 5 weeks ago   Severity: mild   Duration: 5 weeks   Course: stable         Comments   5 week fu OD oct Avastin OD. Patient states vision is stable and unchanged since last visit. Denies any new floaters or FOL.       Last edited by Laurin Coder on 08/21/2021  1:25 PM.      Referring physician: Cipriano Mile, NP [place],  [place] [address]  HISTORICAL INFORMATION:   Selected notes from the MEDICAL RECORD NUMBER    Lab Results  Component Value Date   HGBA1C 6.4 (H) 08/04/2021     CURRENT MEDICATIONS: No current outpatient medications on file. (Ophthalmic Drugs)   No current facility-administered medications for this visit. (Ophthalmic Drugs)   Current Outpatient Medications (Other)  Medication Sig   albuterol (PROAIR HFA) 108 (90 Base) MCG/ACT inhaler Inhale 2 puffs into the lungs every 4 (four) hours as needed for wheezing or shortness of breath.   amLODipine (NORVASC) 10 MG tablet Take 10 mg by mouth at bedtime.   aspirin EC 81 MG tablet Take 81 mg by mouth daily.   atorvastatin (LIPITOR) 80 MG tablet Take 80 mg by mouth at bedtime.   busPIRone (BUSPAR) 7.5 MG tablet Take 7.5 mg by mouth 2 (two) times daily as needed (anxiety).   carvedilol (COREG) 12.5 MG tablet Take 12.5 mg by mouth See admin instructions. Take 1 tablet (12.5 mg) by mouth once daily in the evening on Mondays, Wednesdays, & Fridays. (Dialysis) Take 1 tablet (12.5 mg) by mouth twice daily on Sundays, Tuesdays, Thursdays, & Saturdays. (Non Dialysis)   cinacalcet (SENSIPAR) 90 MG tablet Take 90 mg by mouth See admin instructions. Take 1 tablet (90  mg) by mouth in the evening on Mondays, Wednesdays, & Fridays (dialysis) Take 1 tablet (90 mg) by mouth twice daily on Sundays, Tuesdays, Thursdays, Saturdays. (non dialysis)   EPINEPHrine (AUVI-Q) 0.3 mg/0.3 mL IJ SOAJ injection Inject 0.3 mg into the muscle as needed for anaphylaxis.   ferric citrate (AURYXIA) 1 GM 210 MG(Fe) tablet Take 210-420 mg by mouth See admin instructions. Take 2 tablets (420 mg) by mouth with each meal & take 1 tablet (210 mg) by mouth with snacks.   fluticasone (FLONASE) 50 MCG/ACT nasal spray Place 2 sprays into both nostrils daily as needed for allergies or rhinitis.   fluticasone-salmeterol (ADVAIR HFA) 115-21 MCG/ACT inhaler Inhale two puffs twice daily (Patient not taking: Reported on 08/03/2021)   gabapentin (NEURONTIN) 100 MG capsule TAKE 1 CAPSULE (100 MG TOTAL) BY MOUTH AT BEDTIME. WHEN NECESSARY FOR NEUROPATHY PAIN (Patient taking differently: Take 100 mg by mouth at bedtime as needed (pain). When necessary for neuropathy pain)   glipiZIDE (GLUCOTROL) 5 MG tablet Take 5 mg by mouth daily before breakfast.   guaiFENesin-codeine 100-10 MG/5ML syrup Take 5 mLs by mouth 3 (three) times daily as needed for cough.   isosorbide-hydrALAZINE (BIDIL) 20-37.5 MG tablet Take 1 tablet by mouth 3 (three) times daily. BID on  days of dialysis (Patient taking differently: Take 1 tablet by mouth See admin instructions. hs on Monday,Wednesday,Friday (dialysis days) Tid on Tuesday,Thursday,Saturday,Sunday(non-dialysis days))   lidocaine-prilocaine (EMLA) cream Apply 1 application topically 3 (three) times a week. 1-2 hours prior to dialysis   loperamide (IMODIUM) 2 MG capsule Take 2 capsules (4 mg total) by mouth 3 (three) times daily as needed for diarrhea or loose stools.   montelukast (SINGULAIR) 10 MG tablet TAKE 1 TABLET BY MOUTH EVERYDAY AT BEDTIME   omeprazole (PRILOSEC) 20 MG capsule Take 20 mg by mouth daily.    ondansetron (ZOFRAN) 4 MG tablet Take 1 tablet (4 mg total) by  mouth every 6 (six) hours as needed for nausea.   polyethylene glycol (MIRALAX / GLYCOLAX) 17 g packet Take 17 g by mouth daily. (Patient taking differently: Take 17 g by mouth daily as needed for mild constipation.)   Spacer/Aero-Holding Chambers (AEROCHAMBER PLUS) inhaler Use with inhaler   traZODone (DESYREL) 50 MG tablet Take 50 mg by mouth at bedtime as needed for sleep.   Vitamin D, Ergocalciferol, (DRISDOL) 1.25 MG (50000 UT) CAPS capsule Take 50,000 Units by mouth every Sunday. Takes on Sunday (Patient not taking: Reported on 08/03/2021)   No current facility-administered medications for this visit. (Other)      REVIEW OF SYSTEMS:    ALLERGIES Allergies  Allergen Reactions   Adhesive  [Tape] Hives   Lisinopril Cough   Prednisone Swelling and Other (See Comments)    Excessive fluid buildup    PAST MEDICAL HISTORY Past Medical History:  Diagnosis Date   Abdominal bruit    Anxiety    Arthritis    Osteoarthritis   Asthma    Cervical disc disease    "pinced nerve"   CHF (congestive heart failure) (HCC)    Diabetes mellitus    Type II   Diverticulitis    ESRD (end stage renal disease) ([place])    dialysis - M/W/F- [place]   GERD (gastroesophageal reflux disease)    from medications   GI bleed 03/31/2013   Head injury 07/2017   History of hiatal hernia    Hyperlipidemia    Hypertension    Neuropathy    left leg   Nuclear sclerotic cataract of right eye 04/23/2020   Osteoporosis    Peripheral vascular disease ([place])    Pneumonia    "very young" and a few years ago   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)    Vitrectomy membrane peel endolaser 05-07-2021   Seasonal allergies    Shortness of breath dyspnea    WIth exertion, when fluid builds   Sleep apnea    can't afford cpap    Vitreomacular traction syndrome, 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   05-07-2021  Vitrectomy, membrane peel, PRP, resection posterior  segment of NVD, remnants of peripheral NVE remain in place due to atrophic retina, no vitreous substitute   Past Surgical History:  Procedure Laterality Date   A/V SHUNTOGRAM N/A 09/22/2016   Procedure: A/V Shuntogram - left arm;  Surgeon: Serafina Mitchell, MD;  Location: [facility];  Service: Cardiovascular;  Laterality: N/A;   A/V SHUNTOGRAM N/A 03/22/2018   Procedure: A/V SHUNTOGRAM - left arm;  Surgeon: Serafina Mitchell, MD;  Location: [facility];  Service: Cardiovascular;  Laterality: N/A;   A/V SHUNTOGRAM Left 11/17/2018   Procedure: A/V SHUNTOGRAM;  Surgeon: Angelia Mould, MD;  Location: [facility];  Service: Cardiovascular;  Laterality: Left;   ABDOMINAL HYSTERECTOMY  1993`   AMPUTATION Left 09/01/2016   Procedure: LEFT FOOT TRANSMETATARSAL AMPUTATION;  Surgeon: Newt Minion, MD;  Location: [facility];  Service: Orthopedics;  Laterality: Left;   AMPUTATION Right 08/11/2017   Procedure: RIGHT GREAT TOE AMPUTATION DIGIT;  Surgeon: Rosetta Posner, MD;  Location: [facility];  Service: Vascular;  Laterality: Right;   AMPUTATION Right 12/31/2017   Procedure: RIGHT TRANSMETATARSAL AMPUTATION;  Surgeon: Newt Minion, MD;  Location: [facility];  Service: Orthopedics;  Laterality: Right;   AV FISTULA PLACEMENT Left 04/21/2016   Procedure: INSERTION OF ARTERIOVENOUS (AV) GORE-TEX GRAFT ARM LEFT;  Surgeon: Elam Dutch, MD;  Location: [facility];  Service: Vascular;  Laterality: Left;   BASCILIC VEIN TRANSPOSITION Left 07/10/2014   Procedure: BASCILIC VEIN TRANSPOSITION;  Surgeon: Angelia Mould, MD;  Location: [facility];  Service: Vascular;  Laterality: Left;   [place] Right 11/08/2014   Procedure: FIRST STAGE BASILIC VEIN TRANSPOSITION;  Surgeon: Angelia Mould, MD;  Location: [facility];  Service: Vascular;  Laterality: Right;   [place] Right 01/18/2015   Procedure: SECOND STAGE BASILIC VEIN TRANSPOSITION;  Surgeon: Angelia Mould, MD;   Location: [facility];  Service: Vascular;  Laterality: Right;   CRANIOTOMY N/A 08/23/2017   Procedure: CRANIOTOMY HEMATOMA EVACUATION SUBDURAL;  Surgeon: Ashok Pall, MD;  Location: [facility];  Service: Neurosurgery;  Laterality: N/A;   CRANIOTOMY Left 08/24/2017   Procedure: CRANIOTOMY FOR RECURRENT ACUTE SUBDURAL HEMATOMA;  Surgeon: Ashok Pall, MD;  Location: [facility];  Service: Neurosurgery;  Laterality: Left;   ESOPHAGOGASTRODUODENOSCOPY N/A 03/31/2013   Procedure: ESOPHAGOGASTRODUODENOSCOPY (EGD);  Surgeon: Gatha Mayer, MD;  Location[facility] ENDOS[facility];  Service: Endoscopy;  Laterality: N/A;   EYE SURGERY     laser surgery   FEMORAL-POPLITEAL BYPASS GRAFT Left 07/08/2016   Procedure: LEFT  FEMORAL-BELOW KNEE POPLITEAL ARTERY BYPASS GRAFT USING 6MM X 80 CM PROPATEN GORETEX GRAFT WITH RINGS.;  Surgeon: Rosetta Posner, MD;  Location: [facility];  Service: Vascular;  Laterality: Left;   FEMORAL-POPLITEAL BYPASS GRAFT Right 08/11/2017   Procedure: RIGHT FEMORAL TO BELOW KNEE POPLITEAL ARTERKY  BYPASS GRAFT USING 6MM RINGED PROPATEN GRAFT;  Surgeon: Rosetta Posner, MD;  Location: [facility];  Service: Vascular;  Laterality: Right;   FISTULOGRAM Left 10/29/2014   Procedure: FISTULOGRAM;  Surgeon: Angelia Mould, MD;  Location: [facility] CATH LAB;  Service: Cardiovascular;  Laterality: Left;   GAS/FLUID EXCHANGE Left 12/20/2018   Procedure: AIR GAS EXCHANGE;  Surgeon: Jalene Mullet, MD;  Location: [facility];  Service: Ophthalmology;  Laterality: Left;   INSERTION OF DIALYSIS CATHETER N/A 09/05/2020   Procedure: INSERTION OF DIALYSIS CATHETER;  Surgeon: Cherre Robins, MD;  Location: [facility];  Service: Vascular;  Laterality: N/A;   INSERTION OF DIALYSIS CATHETER Left 03/13/2021   Procedure: INSERTION OF DIALYSIS CATHETER;  Surgeon: Waynetta Sandy, MD;  Location: [facility];  Service: Vascular;  Laterality: Left;   KNEE ARTHROSCOPY Right 05/06/2018   Procedure: RIGHT KNEE ARTHROSCOPY;  Surgeon: Newt Minion, MD;  Location:  [place];  Service: Orthopedics;  Laterality: Right;   KNEE ARTHROSCOPY Right 06/10/2018   Procedure: RIGHT KNEE ARTHROSCOPY AND DEBRIDEMENT;  Surgeon: Newt Minion, MD;  Location: [facility];  Service: Orthopedics;  Laterality: Right;   LIGATION OF ARTERIOVENOUS  FISTULA Left 04/21/2016   Procedure: LIGATION OF ARTERIOVENOUS  FISTULA LEFT ARM;  Surgeon: Elam Dutch, MD;  Location: [facility];  Service:  Vascular;  Laterality: Left;   LOWER EXTREMITY ANGIOGRAPHY N/A 04/06/2017   Procedure: Lower Extremity Angiography - Right;  Surgeon: Serafina Mitchell, MD;  Location: [facility];  Service: Cardiovascular;  Laterality: N/A;   MEMBRANE PEEL Left 12/20/2018   Procedure: MEMBRANE PEEL;  Surgeon: Jalene Mullet, MD;  Location: [facility];  Service: Ophthalmology;  Laterality: Left;   PATCH ANGIOPLASTY Right 01/18/2015   Procedure: BASILIC VEIN PATCH ANGIOPLASTY USING VASCUGUARD PATCH;  Surgeon: Angelia Mould, MD;  Location: [facility];  Service: Vascular;  Laterality: Right;   PERIPHERAL VASCULAR BALLOON ANGIOPLASTY  09/22/2016   Procedure: Peripheral Vascular Balloon Angioplasty;  Surgeon: Serafina Mitchell, MD;  Location: [facility];  Service: Cardiovascular;;  Lt. Fistula   PERIPHERAL VASCULAR BALLOON ANGIOPLASTY Left 03/22/2018   Procedure: PERIPHERAL VASCULAR BALLOON ANGIOPLASTY;  Surgeon: Serafina Mitchell, MD;  Location: [place] [facility];  Service: Cardiovascular;  Laterality: Left;  Arm shunt   PERIPHERAL VASCULAR CATHETERIZATION N/A 06/23/2016   Procedure: Abdominal Aortogram w/Lower Extremity;  Surgeon: Serafina Mitchell, MD;  Location: [facility];  Service: Cardiovascular;  Laterality: N/A;   PERIPHERAL VASCULAR CATHETERIZATION  06/23/2016   Procedure: Peripheral Vascular Intervention;  Surgeon: Serafina Mitchell, MD;  Location: [facility] [facility];  Service: Cardiovascular;;  lt common and external illiac artery   PHOTOCOAGULATION WITH LASER Left 12/20/2018   Procedure: PHOTOCOAGULATION  WITH LASER;  Surgeon: Jalene Mullet, MD;  Location: [place];  Service: Ophthalmology;  Laterality: Left;   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   Procedure: REPAIR OF COMPLEX TRACTION RETINAL DETACHMENT;  Surgeon: Jalene Mullet, MD;  Location: [place] [facility];  Service: Ophthalmology;  Laterality: Left;   REVISION OF ARTERIOVENOUS GORETEX GRAFT Left 09/05/2020   Procedure: REVISION OF ARTERIOVENOUS GORETEX GRAFT LEFT;  Surgeon: Cherre Robins, MD;  Location: [facility];  Service: Vascular;  Laterality: Left;    FAMILY HISTORY Family History  Problem Relation Age of Onset   Other Mother        not sure of cause of death   Diabetes Father    Diabetes Sister    Diabetes Sister    Pancreatic cancer Maternal Grandmother    Colon cancer Neg Hx     SOCIAL HISTORY Social History   Tobacco Use   Smoking status: Former    Packs/day: 0.35    Years: 40.00    Pack years: 14.00    Types: Cigarettes    Quit date: 05/10/2012    Years since quitting: 9.2   Smokeless tobacco: Never  Vaping Use   Vaping Use: Never used  Substance Use Topics   Alcohol use: No   Drug use: No    Comment: marijuana; quit in early 1980's         OPHTHALMIC EXAM:  Base Eye Exam     Visual Acuity (ETDRS)       Right Left   Dist [place] 20/50 -2 20/100   Dist ph [place] NI NI         Tonometry (Tonopen, 1:30 PM)       Right Left   Pressure 18 21         Pupils       Dark Light Shape APD   Right 3 3 Round None   Left   Irregular None         Extraocular Movement       Right Left    Full Full  Neuro/Psych     Oriented x3: Yes   Mood/Affect: Normal         Dilation     Right eye: 1.0% Mydriacyl, 2.5% Phenylephrine @ 1:30 PM           Slit Lamp and Fundus Exam     External Exam       Right Left   External Normal Normal         Slit Lamp Exam       Right Left   Lids/Lashes Normal Normal   Conjunctiva/Sclera White and quiet White and quiet    Cornea Clear Clear   Anterior Chamber Deep and quiet Deep and quiet   Iris Round and reactive Round and reactive   Lens Centered posterior chamber intraocular lens Centered posterior chamber intraocular lens   Anterior Vitreous Normal Normal, remnant         Fundus Exam       Right Left   Posterior Vitreous Clear, avitric    Disc 1+ Pallor    C/D Ratio 0.5    Macula Microaneurysms, Moderate clinically significant macular edema, Epiretinal membrane over the fovea and extending inferotemporal to an old fibrous tissue island    Vessels PDR-active with NVE inferior to nerve NVE temporal portion of macula, responsive for traction on epiretinal membrane fovea    Periphery Good peripheral PRP             IMAGING AND PROCEDURES  Imaging and Procedures for 08/21/21  Intravitreal Injection, Pharmacologic Agent - OD - Right Eye       Time Out 08/21/2021. 2:36 PM. Confirmed correct patient, procedure, site, and patient consented.   Anesthesia Topical anesthesia was used. Anesthetic medications included Lidocaine 4%.   Procedure Preparation included Tobramycin 0.3%, Ofloxacin , 10% betadine to eyelids, 5% betadine to ocular surface. A 30 gauge needle was used.   Injection: 2.5 mg bevacizumab 2.5 MG/0.1ML   Route: Intravitreal, Site: Right Eye   NDC: [phone], Lot: [id number]   Post-op Post injection exam found visual acuity of at least counting fingers. The patient tolerated the procedure well. There were no complications. The patient received written and verbal post procedure care education. Post injection medications were not given.      OCT, Retina - OU - Both Eyes       Right Eye Quality was good. Scan locations included subfoveal. Central Foveal Thickness: 536. Findings include epiretinal membrane.   Left Eye Quality was good. Central Foveal Thickness: 749. Progression has improved.   Notes OS with worsened  CSME yet still center involved.  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, associated with type 2 diabetes  mellitus (HCC)  N17.0017 Intravitreal Injection, Pharmacologic Agent - OD - Right Eye    OCT, Retina - OU - Both Eyes    bevacizumab (AVASTIN) SOSY 2.5 mg    2. Macular pucker, right eye  H35.371     3. Diabetic macular edema of right eye with proliferative retinopathy associated with type 2 diabetes mellitus ([place])  E11.3511     4. 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 type 2 diabetes mellitus ([place])  C94.4967       1.  OD, with CSME yet still worse epiretinal membrane.  We will control diabetic disease in CSME with intravitreal Avastin today and will schedule vitrectomy and membrane peel in the coming weeks  2.  OS follow-up in 1 to 2 weeks for injection with use of Ozurdex left eye to control the massive CSME progression  3.  Ophthalmic Meds Ordered this visit:  Meds ordered this encounter  Medications   bevacizumab (AVASTIN) SOSY 2.5 mg       Return in about 1 week (around 08/28/2021) for dilate, OS, inj OZURDEX.  There  are no Patient Instructions on file for this visit.   Explained the diagnoses, plan, and follow up with the patient and they expressed understanding.  Patient expressed understanding of the importance of proper follow up care.   Clent Demark Anis Cinelli M.D. Diseases & Surgery of the Retina and Vitreous Retina & Diabetic [place] 08/21/21     Abbreviations: M myopia (nearsighted); A astigmatism; H hyperopia (farsighted); P presbyopia; Mrx spectacle prescription;  CTL contact lenses; OD right eye; OS left eye; OU both eyes  XT exotropia; ET esotropia; PEK punctate epithelial keratitis; PEE punctate epithelial erosions; DES dry eye syndrome; MGD meibomian gland dysfunction; ATs artificial tears; PFAT's preservative free artificial tears; [place] nuclear sclerotic cataract; PSC posterior subcapsular cataract; ERM epi-retinal membrane; PVD posterior vitreous detachment; RD retinal detachment; DM diabetes mellitus; DR diabetic retinopathy; NPDR non-proliferative diabetic retinopathy; PDR proliferative diabetic retinopathy; CSME clinically significant macular edema; DME diabetic macular edema; dbh dot blot hemorrhages; CWS cotton wool spot; POAG primary open angle glaucoma; C/D cup-to-disc ratio; HVF humphrey visual field; GVF goldmann visual field; OCT optical coherence tomography; IOP intraocular pressure; BRVO Branch retinal vein occlusion; CRVO central retinal vein occlusion; CRAO central retinal artery occlusion; BRAO branch retinal artery occlusion; RT retinal tear; SB scleral buckle; PPV pars plana vitrectomy; VH Vitreous hemorrhage; PRP panretinal laser photocoagulation; IVK intravitreal kenalog; VMT vitreomacular traction; MH Macular hole;  NVD neovascularization of the disc; NVE neovascularization elsewhere; AREDS age related eye disease study; ARMD age related macular degeneration; POAG primary open angle glaucoma; EBMD epithelial/anterior basement membrane dystrophy; ACIOL anterior chamber intraocular lens;  IOL intraocular lens; PCIOL posterior chamber intraocular lens; Phaco/IOL phacoemulsification with intraocular lens placement; [place] photorefractive keratectomy; LASIK laser assisted in situ keratomileusis; HTN hypertension; DM diabetes mellitus; COPD chronic obstructive pulmonary disease

## 2021-08-21 NOTE — Assessment & Plan Note (Signed)
Worsening of CSME OS, will need antivegF and/or steroid in the left eye to calm the swelling, today at 5 weeks postinjection

## 2021-08-21 NOTE — Assessment & Plan Note (Signed)
Quiescent PDR post PRP OD

## 2021-08-22 DIAGNOSIS — D509 Iron deficiency anemia, unspecified: Secondary | ICD-10-CM | POA: Diagnosis not present

## 2021-08-22 DIAGNOSIS — N186 End stage renal disease: Secondary | ICD-10-CM | POA: Diagnosis not present

## 2021-08-22 DIAGNOSIS — N2581 Secondary hyperparathyroidism of renal origin: Secondary | ICD-10-CM | POA: Diagnosis not present

## 2021-08-22 DIAGNOSIS — D631 Anemia in chronic kidney disease: Secondary | ICD-10-CM | POA: Diagnosis not present

## 2021-08-22 DIAGNOSIS — Z992 Dependence on renal dialysis: Secondary | ICD-10-CM | POA: Diagnosis not present

## 2021-08-22 DIAGNOSIS — T782XXA Anaphylactic shock, unspecified, initial encounter: Secondary | ICD-10-CM | POA: Diagnosis not present

## 2021-08-25 DIAGNOSIS — D631 Anemia in chronic kidney disease: Secondary | ICD-10-CM | POA: Diagnosis not present

## 2021-08-25 DIAGNOSIS — T782XXA Anaphylactic shock, unspecified, initial encounter: Secondary | ICD-10-CM | POA: Diagnosis not present

## 2021-08-25 DIAGNOSIS — Z992 Dependence on renal dialysis: Secondary | ICD-10-CM | POA: Diagnosis not present

## 2021-08-25 DIAGNOSIS — D509 Iron deficiency anemia, unspecified: Secondary | ICD-10-CM | POA: Diagnosis not present

## 2021-08-25 DIAGNOSIS — N186 End stage renal disease: Secondary | ICD-10-CM | POA: Diagnosis not present

## 2021-08-25 DIAGNOSIS — N2581 Secondary hyperparathyroidism of renal origin: Secondary | ICD-10-CM | POA: Diagnosis not present

## 2021-08-26 ENCOUNTER — Ambulatory Visit (INDEPENDENT_AMBULATORY_CARE_PROVIDER_SITE_OTHER): Payer: Medicare Other | Admitting: *Deleted

## 2021-08-26 DIAGNOSIS — J309 Allergic rhinitis, unspecified: Secondary | ICD-10-CM | POA: Diagnosis not present

## 2021-08-27 DIAGNOSIS — N186 End stage renal disease: Secondary | ICD-10-CM | POA: Diagnosis not present

## 2021-08-27 DIAGNOSIS — N2581 Secondary hyperparathyroidism of renal origin: Secondary | ICD-10-CM | POA: Diagnosis not present

## 2021-08-27 DIAGNOSIS — I159 Secondary hypertension, unspecified: Secondary | ICD-10-CM | POA: Diagnosis not present

## 2021-08-27 DIAGNOSIS — E876 Hypokalemia: Secondary | ICD-10-CM | POA: Diagnosis not present

## 2021-08-27 DIAGNOSIS — D509 Iron deficiency anemia, unspecified: Secondary | ICD-10-CM | POA: Diagnosis not present

## 2021-08-27 DIAGNOSIS — D631 Anemia in chronic kidney disease: Secondary | ICD-10-CM | POA: Diagnosis not present

## 2021-08-27 DIAGNOSIS — Z992 Dependence on renal dialysis: Secondary | ICD-10-CM | POA: Diagnosis not present

## 2021-08-27 DIAGNOSIS — T7840XA Allergy, unspecified, initial encounter: Secondary | ICD-10-CM | POA: Diagnosis not present

## 2021-08-28 ENCOUNTER — Encounter (INDEPENDENT_AMBULATORY_CARE_PROVIDER_SITE_OTHER): Payer: Medicare Other | Admitting: Ophthalmology

## 2021-08-29 DIAGNOSIS — D509 Iron deficiency anemia, unspecified: Secondary | ICD-10-CM | POA: Diagnosis not present

## 2021-08-29 DIAGNOSIS — T7840XA Allergy, unspecified, initial encounter: Secondary | ICD-10-CM | POA: Diagnosis not present

## 2021-08-29 DIAGNOSIS — N2581 Secondary hyperparathyroidism of renal origin: Secondary | ICD-10-CM | POA: Diagnosis not present

## 2021-08-29 DIAGNOSIS — D631 Anemia in chronic kidney disease: Secondary | ICD-10-CM | POA: Diagnosis not present

## 2021-08-29 DIAGNOSIS — Z992 Dependence on renal dialysis: Secondary | ICD-10-CM | POA: Diagnosis not present

## 2021-08-29 DIAGNOSIS — N186 End stage renal disease: Secondary | ICD-10-CM | POA: Diagnosis not present

## 2021-09-01 DIAGNOSIS — N2581 Secondary hyperparathyroidism of renal origin: Secondary | ICD-10-CM | POA: Diagnosis not present

## 2021-09-01 DIAGNOSIS — D509 Iron deficiency anemia, unspecified: Secondary | ICD-10-CM | POA: Diagnosis not present

## 2021-09-01 DIAGNOSIS — T7840XA Allergy, unspecified, initial encounter: Secondary | ICD-10-CM | POA: Diagnosis not present

## 2021-09-01 DIAGNOSIS — N186 End stage renal disease: Secondary | ICD-10-CM | POA: Diagnosis not present

## 2021-09-01 DIAGNOSIS — D631 Anemia in chronic kidney disease: Secondary | ICD-10-CM | POA: Diagnosis not present

## 2021-09-01 DIAGNOSIS — Z992 Dependence on renal dialysis: Secondary | ICD-10-CM | POA: Diagnosis not present

## 2021-09-03 DIAGNOSIS — Z992 Dependence on renal dialysis: Secondary | ICD-10-CM | POA: Diagnosis not present

## 2021-09-03 DIAGNOSIS — N186 End stage renal disease: Secondary | ICD-10-CM | POA: Diagnosis not present

## 2021-09-03 DIAGNOSIS — T7840XA Allergy, unspecified, initial encounter: Secondary | ICD-10-CM | POA: Diagnosis not present

## 2021-09-03 DIAGNOSIS — N2581 Secondary hyperparathyroidism of renal origin: Secondary | ICD-10-CM | POA: Diagnosis not present

## 2021-09-03 DIAGNOSIS — D631 Anemia in chronic kidney disease: Secondary | ICD-10-CM | POA: Diagnosis not present

## 2021-09-03 DIAGNOSIS — D509 Iron deficiency anemia, unspecified: Secondary | ICD-10-CM | POA: Diagnosis not present

## 2021-09-05 DIAGNOSIS — D631 Anemia in chronic kidney disease: Secondary | ICD-10-CM | POA: Diagnosis not present

## 2021-09-05 DIAGNOSIS — T7840XA Allergy, unspecified, initial encounter: Secondary | ICD-10-CM | POA: Diagnosis not present

## 2021-09-05 DIAGNOSIS — Z992 Dependence on renal dialysis: Secondary | ICD-10-CM | POA: Diagnosis not present

## 2021-09-05 DIAGNOSIS — D509 Iron deficiency anemia, unspecified: Secondary | ICD-10-CM | POA: Diagnosis not present

## 2021-09-05 DIAGNOSIS — N2581 Secondary hyperparathyroidism of renal origin: Secondary | ICD-10-CM | POA: Diagnosis not present

## 2021-09-05 DIAGNOSIS — N186 End stage renal disease: Secondary | ICD-10-CM | POA: Diagnosis not present

## 2021-09-08 DIAGNOSIS — N2581 Secondary hyperparathyroidism of renal origin: Secondary | ICD-10-CM | POA: Diagnosis not present

## 2021-09-08 DIAGNOSIS — Z992 Dependence on renal dialysis: Secondary | ICD-10-CM | POA: Diagnosis not present

## 2021-09-08 DIAGNOSIS — D509 Iron deficiency anemia, unspecified: Secondary | ICD-10-CM | POA: Diagnosis not present

## 2021-09-08 DIAGNOSIS — N186 End stage renal disease: Secondary | ICD-10-CM | POA: Diagnosis not present

## 2021-09-08 DIAGNOSIS — D631 Anemia in chronic kidney disease: Secondary | ICD-10-CM | POA: Diagnosis not present

## 2021-09-08 DIAGNOSIS — T7840XA Allergy, unspecified, initial encounter: Secondary | ICD-10-CM | POA: Diagnosis not present

## 2021-09-09 ENCOUNTER — Encounter (INDEPENDENT_AMBULATORY_CARE_PROVIDER_SITE_OTHER): Payer: Medicare Other | Admitting: Ophthalmology

## 2021-09-09 ENCOUNTER — Ambulatory Visit (INDEPENDENT_AMBULATORY_CARE_PROVIDER_SITE_OTHER): Payer: Medicare Other | Admitting: *Deleted

## 2021-09-09 DIAGNOSIS — J309 Allergic rhinitis, unspecified: Secondary | ICD-10-CM

## 2021-09-10 DIAGNOSIS — N2581 Secondary hyperparathyroidism of renal origin: Secondary | ICD-10-CM | POA: Diagnosis not present

## 2021-09-10 DIAGNOSIS — N186 End stage renal disease: Secondary | ICD-10-CM | POA: Diagnosis not present

## 2021-09-10 DIAGNOSIS — Z992 Dependence on renal dialysis: Secondary | ICD-10-CM | POA: Diagnosis not present

## 2021-09-10 DIAGNOSIS — D631 Anemia in chronic kidney disease: Secondary | ICD-10-CM | POA: Diagnosis not present

## 2021-09-10 DIAGNOSIS — T7840XA Allergy, unspecified, initial encounter: Secondary | ICD-10-CM | POA: Diagnosis not present

## 2021-09-10 DIAGNOSIS — D509 Iron deficiency anemia, unspecified: Secondary | ICD-10-CM | POA: Diagnosis not present

## 2021-09-11 ENCOUNTER — Ambulatory Visit (INDEPENDENT_AMBULATORY_CARE_PROVIDER_SITE_OTHER): Payer: Medicare Other | Admitting: Ophthalmology

## 2021-09-11 ENCOUNTER — Encounter (INDEPENDENT_AMBULATORY_CARE_PROVIDER_SITE_OTHER): Payer: Medicare Other | Admitting: Ophthalmology

## 2021-09-11 ENCOUNTER — Encounter (INDEPENDENT_AMBULATORY_CARE_PROVIDER_SITE_OTHER): Payer: Self-pay | Admitting: Ophthalmology

## 2021-09-11 ENCOUNTER — Other Ambulatory Visit: Payer: Self-pay

## 2021-09-11 DIAGNOSIS — H35371 Puckering of macula, right eye: Secondary | ICD-10-CM | POA: Diagnosis not present

## 2021-09-11 DIAGNOSIS — E1151 Type 2 diabetes mellitus with diabetic peripheral angiopathy without gangrene: Secondary | ICD-10-CM | POA: Diagnosis not present

## 2021-09-11 DIAGNOSIS — Z0001 Encounter for general adult medical examination with abnormal findings: Secondary | ICD-10-CM | POA: Diagnosis not present

## 2021-09-11 DIAGNOSIS — E785 Hyperlipidemia, unspecified: Secondary | ICD-10-CM | POA: Diagnosis not present

## 2021-09-11 DIAGNOSIS — E113512 Type 2 diabetes mellitus with proliferative diabetic retinopathy with macular edema, left eye: Secondary | ICD-10-CM | POA: Diagnosis not present

## 2021-09-11 DIAGNOSIS — I70209 Unspecified atherosclerosis of native arteries of extremities, unspecified extremity: Secondary | ICD-10-CM | POA: Diagnosis not present

## 2021-09-11 DIAGNOSIS — D689 Coagulation defect, unspecified: Secondary | ICD-10-CM | POA: Diagnosis not present

## 2021-09-11 DIAGNOSIS — E1122 Type 2 diabetes mellitus with diabetic chronic kidney disease: Secondary | ICD-10-CM | POA: Diagnosis not present

## 2021-09-11 DIAGNOSIS — Z89429 Acquired absence of other toe(s), unspecified side: Secondary | ICD-10-CM | POA: Diagnosis not present

## 2021-09-11 DIAGNOSIS — D649 Anemia, unspecified: Secondary | ICD-10-CM | POA: Diagnosis not present

## 2021-09-11 DIAGNOSIS — E213 Hyperparathyroidism, unspecified: Secondary | ICD-10-CM | POA: Diagnosis not present

## 2021-09-11 DIAGNOSIS — I132 Hypertensive heart and chronic kidney disease with heart failure and with stage 5 chronic kidney disease, or end stage renal disease: Secondary | ICD-10-CM | POA: Diagnosis not present

## 2021-09-11 DIAGNOSIS — F17210 Nicotine dependence, cigarettes, uncomplicated: Secondary | ICD-10-CM | POA: Diagnosis not present

## 2021-09-11 DIAGNOSIS — E113599 Type 2 diabetes mellitus with proliferative diabetic retinopathy without macular edema, unspecified eye: Secondary | ICD-10-CM | POA: Diagnosis not present

## 2021-09-11 MED ORDER — DEXAMETHASONE 0.7 MG IZ IMPL
0.7000 mg | DRUG_IMPLANT | INTRAVITREAL | Status: AC | PRN
  Administered 2021-09-11: .7 mg via INTRAVITREAL

## 2021-09-11 NOTE — Assessment & Plan Note (Signed)
Massive CSME continues, resistant to all antivegF medications, delivered Ozurdex today and will attempt to treat left eye with PRP as fully as possible in the office setting so as to decrease vegF burden for the left eye long-term

## 2021-09-11 NOTE — Progress Notes (Signed)
09/11/2021     CHIEF COMPLAINT Patient presents for  Chief Complaint  Patient presents with   Retina Evaluation      HISTORY OF PRESENT ILLNESS: Destiny Day is a 71 y.o. female who presents to the clinic today for:   HPI     Retina Evaluation           Laterality: left eye   Onset: 8 weeks ago   Associated Symptoms: Negative for Flashes and Floaters         Comments   8 weeks dilate OS, Ozurdex injection OS, OCT. Patient states vision is stable and unchanged since last visit. Denies any new floaters or FOL.        Last edited by Laurin Coder on 09/11/2021  1:15 PM.      Referring physician: Cipriano Mile, NP [place],  [place] [address]  HISTORICAL INFORMATION:   Selected notes from the MEDICAL RECORD NUMBER    Lab Results  Component Value Date   HGBA1C 6.4 (H) 08/04/2021     CURRENT MEDICATIONS: No current outpatient medications on file. (Ophthalmic Drugs)   No current facility-administered medications for this visit. (Ophthalmic Drugs)   Current Outpatient Medications (Other)  Medication Sig   albuterol (PROAIR HFA) 108 (90 Base) MCG/ACT inhaler Inhale 2 puffs into the lungs every 4 (four) hours as needed for wheezing or shortness of breath.   amLODipine (NORVASC) 10 MG tablet Take 10 mg by mouth at bedtime.   aspirin EC 81 MG tablet Take 81 mg by mouth daily.   atorvastatin (LIPITOR) 80 MG tablet Take 80 mg by mouth at bedtime.   busPIRone (BUSPAR) 7.5 MG tablet Take 7.5 mg by mouth 2 (two) times daily as needed (anxiety).   carvedilol (COREG) 12.5 MG tablet Take 12.5 mg by mouth See admin instructions. Take 1 tablet (12.5 mg) by mouth once daily in the evening on Mondays, Wednesdays, & Fridays. (Dialysis) Take 1 tablet (12.5 mg) by mouth twice daily on Sundays, Tuesdays, Thursdays, & Saturdays. (Non Dialysis)   cinacalcet (SENSIPAR) 90 MG tablet Take 90 mg by mouth See admin instructions. Take 1 tablet (90 mg) by mouth in  the evening on Mondays, Wednesdays, & Fridays (dialysis) Take 1 tablet (90 mg) by mouth twice daily on Sundays, Tuesdays, Thursdays, Saturdays. (non dialysis)   EPINEPHrine (AUVI-Q) 0.3 mg/0.3 mL IJ SOAJ injection Inject 0.3 mg into the muscle as needed for anaphylaxis.   ferric citrate (AURYXIA) 1 GM 210 MG(Fe) tablet Take 210-420 mg by mouth See admin instructions. Take 2 tablets (420 mg) by mouth with each meal & take 1 tablet (210 mg) by mouth with snacks.   fluticasone (FLONASE) 50 MCG/ACT nasal spray Place 2 sprays into both nostrils daily as needed for allergies or rhinitis.   fluticasone-salmeterol (ADVAIR HFA) 115-21 MCG/ACT inhaler Inhale two puffs twice daily (Patient not taking: Reported on 08/03/2021)   gabapentin (NEURONTIN) 100 MG capsule TAKE 1 CAPSULE (100 MG TOTAL) BY MOUTH AT BEDTIME. WHEN NECESSARY FOR NEUROPATHY PAIN (Patient taking differently: Take 100 mg by mouth at bedtime as needed (pain). When necessary for neuropathy pain)   glipiZIDE (GLUCOTROL) 5 MG tablet Take 5 mg by mouth daily before breakfast.   guaiFENesin-codeine 100-10 MG/5ML syrup Take 5 mLs by mouth 3 (three) times daily as needed for cough.   isosorbide-hydrALAZINE (BIDIL) 20-37.5 MG tablet Take 1 tablet by mouth 3 (three) times daily. BID on days of dialysis (Patient taking differently: Take 1 tablet  by mouth See admin instructions. hs on Monday,Wednesday,Friday (dialysis days) Tid on Tuesday,Thursday,Saturday,Sunday(non-dialysis days))   lidocaine-prilocaine (EMLA) cream Apply 1 application topically 3 (three) times a week. 1-2 hours prior to dialysis   loperamide (IMODIUM) 2 MG capsule Take 2 capsules (4 mg total) by mouth 3 (three) times daily as needed for diarrhea or loose stools.   montelukast (SINGULAIR) 10 MG tablet TAKE 1 TABLET BY MOUTH EVERYDAY AT BEDTIME   omeprazole (PRILOSEC) 20 MG capsule Take 20 mg by mouth daily.    ondansetron (ZOFRAN) 4 MG tablet Take 1 tablet (4 mg total) by mouth every 6  (six) hours as needed for nausea.   polyethylene glycol (MIRALAX / GLYCOLAX) 17 g packet Take 17 g by mouth daily. (Patient taking differently: Take 17 g by mouth daily as needed for mild constipation.)   Spacer/Aero-Holding Chambers (AEROCHAMBER PLUS) inhaler Use with inhaler   traZODone (DESYREL) 50 MG tablet Take 50 mg by mouth at bedtime as needed for sleep.   Vitamin D, Ergocalciferol, (DRISDOL) 1.25 MG (50000 UT) CAPS capsule Take 50,000 Units by mouth every Sunday. Takes on Sunday (Patient not taking: Reported on 08/03/2021)   No current facility-administered medications for this visit. (Other)      REVIEW OF SYSTEMS:    ALLERGIES Allergies  Allergen Reactions   Adhesive  [Tape] Hives   Lisinopril Cough   Prednisone Swelling and Other (See Comments)    Excessive fluid buildup    PAST MEDICAL HISTORY Past Medical History:  Diagnosis Date   Abdominal bruit    Anxiety    Arthritis    Osteoarthritis   Asthma    Cervical disc disease    "pinced nerve"   CHF (congestive heart failure) (HCC)    Diabetes mellitus    Type II   Diverticulitis    ESRD (end stage renal disease) ([place])    dialysis - M/W/F- [place]   GERD (gastroesophageal reflux disease)    from medications   GI bleed 03/31/2013   Head injury 07/2017   History of hiatal hernia    Hyperlipidemia    Hypertension    Neuropathy    left leg   Nuclear sclerotic cataract of right eye 04/23/2020   Osteoporosis    Peripheral vascular disease ([place])    Pneumonia    "very young" and a few years ago   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)    Vitrectomy membrane peel endolaser 05-07-2021   Seasonal allergies    Shortness of breath dyspnea    WIth exertion, when fluid builds   Sleep apnea    can't afford cpap    Vitreomacular traction syndrome, 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   05-07-2021  Vitrectomy, membrane peel, PRP, resection posterior segment of NVD,  remnants of peripheral NVE remain in place due to atrophic retina, no vitreous substitute   Past Surgical History:  Procedure Laterality Date   A/V SHUNTOGRAM N/A 09/22/2016   Procedure: A/V Shuntogram - left arm;  Surgeon: Serafina Mitchell, MD;  Location: [facility];  Service: Cardiovascular;  Laterality: N/A;   A/V SHUNTOGRAM N/A 03/22/2018   Procedure: A/V SHUNTOGRAM - left arm;  Surgeon: Serafina Mitchell, MD;  Location: [facility];  Service: Cardiovascular;  Laterality: N/A;   A/V SHUNTOGRAM Left 11/17/2018   Procedure: A/V SHUNTOGRAM;  Surgeon: Angelia Mould, MD;  Location: [facility];  Service: Cardiovascular;  Laterality: Left;   ABDOMINAL HYSTERECTOMY  1993`  AMPUTATION Left 09/01/2016   Procedure: LEFT FOOT TRANSMETATARSAL AMPUTATION;  Surgeon: Newt Minion, MD;  Location: [facility];  Service: Orthopedics;  Laterality: Left;   AMPUTATION Right 08/11/2017   Procedure: RIGHT GREAT TOE AMPUTATION DIGIT;  Surgeon: Rosetta Posner, MD;  Location: [facility];  Service: Vascular;  Laterality: Right;   AMPUTATION Right 12/31/2017   Procedure: RIGHT TRANSMETATARSAL AMPUTATION;  Surgeon: Newt Minion, MD;  Location: [facility];  Service: Orthopedics;  Laterality: Right;   AV FISTULA PLACEMENT Left 04/21/2016   Procedure: INSERTION OF ARTERIOVENOUS (AV) GORE-TEX GRAFT ARM LEFT;  Surgeon: Elam Dutch, MD;  Location: [facility];  Service: Vascular;  Laterality: Left;   BASCILIC VEIN TRANSPOSITION Left 07/10/2014   Procedure: BASCILIC VEIN TRANSPOSITION;  Surgeon: Angelia Mould, MD;  Location: [place] [name] [facility];  Service: Vascular;  Laterality: Left;   [place] Right 11/08/2014   Procedure: FIRST STAGE BASILIC VEIN TRANSPOSITION;  Surgeon: Angelia Mould, MD;  Location: [facility];  Service: Vascular;  Laterality: Right;   [place] Right 01/18/2015   Procedure: SECOND STAGE BASILIC VEIN TRANSPOSITION;  Surgeon: Angelia Mould, MD;  Location: [facility];  Service: Vascular;  Laterality: Right;   CRANIOTOMY N/A 08/23/2017   Procedure: CRANIOTOMY HEMATOMA EVACUATION SUBDURAL;  Surgeon: Ashok Pall, MD;  Location: [facility];  Service: Neurosurgery;  Laterality: N/A;   CRANIOTOMY Left 08/24/2017   Procedure: CRANIOTOMY FOR RECURRENT ACUTE SUBDURAL HEMATOMA;  Surgeon: Ashok Pall, MD;  Location: [facility];  Service: Neurosurgery;  Laterality: Left;   ESOPHAGOGASTRODUODENOSCOPY N/A 03/31/2013   Procedure: ESOPHAGOGASTRODUODENOSCOPY (EGD);  Surgeon: Gatha Mayer, MD;  Location: [facility];  Service: Endoscopy;  Laterality: N/A;   EYE SURGERY     laser surgery   FEMORAL-POPLITEAL BYPASS GRAFT Left 07/08/2016   Procedure: LEFT  FEMORAL-BELOW KNEE POPLITEAL ARTERY BYPASS GRAFT USING 6MM X 80 CM PROPATEN GORETEX GRAFT WITH RINGS.;  Surgeon: Rosetta Posner, MD;  [facility];  Service: Vascular;  Laterality: Left;   FEMORAL-POPLITEAL BYPASS GRAFT Right 08/11/2017   Procedure: RIGHT FEMORAL TO BELOW KNEE POPLITEAL ARTERKY  BYPASS GRAFT USING 6MM RINGED PROPATEN GRAFT;  Surgeon: Rosetta Posner, MD;  Location: [facility];  Service: Vascular;  Laterality: Right;   FISTULOGRAM Left 10/29/2014   Procedure: FISTULOGRAM;  Surgeon: Angelia Mould, MD;  Location: [facility] CATH LAB;  Service: Cardiovascular;  Laterality: Left;   GAS/FLUID EXCHANGE Left 12/20/2018   Procedure: AIR GAS EXCHANGE;  Surgeon: Jalene Mullet, MD;  Location: [place];  Service: Ophthalmology;  Laterality: Left;   INSERTION OF DIALYSIS CATHETER N/A 09/05/2020   Procedure: INSERTION OF DIALYSIS CATHETER;  Surgeon: Cherre Robins, MD;  Location: [place];  Service: Vascular;  Laterality: N/A;   INSERTION OF DIALYSIS CATHETER Left 03/13/2021   Procedure: INSERTION OF DIALYSIS CATHETER;  Surgeon: Waynetta Sandy, MD;  Location: [place];  Service: Vascular;  Laterality: Left;   KNEE ARTHROSCOPY Right 05/06/2018   Procedure: RIGHT KNEE ARTHROSCOPY;  Surgeon: Newt Minion, MD;  Location: [facility];   Service: Orthopedics;  Laterality: Right;   KNEE ARTHROSCOPY Right 06/10/2018   Procedure: RIGHT KNEE ARTHROSCOPY AND DEBRIDEMENT;  Surgeon: Newt Minion, MD;  Location: [facility];  Service: Orthopedics;  Laterality: Right;   LIGATION OF ARTERIOVENOUS  FISTULA Left 04/21/2016   Procedure: LIGATION OF ARTERIOVENOUS  FISTULA LEFT ARM;  Surgeon: Elam Dutch, MD;  Location: [facility];  Service: Vascular;  Laterality: Left;   LOWER EXTREMITY ANGIOGRAPHY N/A  04/06/2017   Procedure: Lower Extremity Angiography - Right;  Surgeon: Serafina Mitchell, MD;  Location: [facility];  Service: Cardiovascular;  Laterality: N/A;   MEMBRANE PEEL Left 12/20/2018   Procedure: MEMBRANE PEEL;  Surgeon: Jalene Mullet, MD;  Location: [place];  Service: Ophthalmology;  Laterality: Left;   PATCH ANGIOPLASTY Right 01/18/2015   Procedure: BASILIC VEIN PATCH ANGIOPLASTY USING VASCUGUARD PATCH;  Surgeon: Angelia Mould, MD;  Location: [facility];  Service: Vascular;  Laterality: Right;   PERIPHERAL VASCULAR BALLOON ANGIOPLASTY  09/22/2016   Procedure: Peripheral Vascular Balloon Angioplasty;  Surgeon: Serafina Mitchell, MD;  Location: [facility] [facility];  Service: Cardiovascular;;  Lt. Fistula   PERIPHERAL VASCULAR BALLOON ANGIOPLASTY Left 03/22/2018   Procedure: PERIPHERAL VASCULAR BALLOON ANGIOPLASTY;  Surgeon: Serafina Mitchell, MD;  Location: [facility];  Service: Cardiovascular;  Laterality: Left;  Arm shunt   PERIPHERAL VASCULAR CATHETERIZATION N/A 06/23/2016   Procedure: Abdominal Aortogram w/Lower Extremity;  Surgeon: Serafina Mitchell, MD;  Location: [facility];  Service: Cardiovascular;  Laterality: N/A;   PERIPHERAL VASCULAR CATHETERIZATION  06/23/2016   Procedure: Peripheral Vascular Intervention;  Surgeon: Serafina Mitchell, MD;  Location: [place] [facility];  Service: Cardiovascular;;  lt common and external illiac artery   PHOTOCOAGULATION WITH LASER Left 12/20/2018   Procedure: PHOTOCOAGULATION WITH  LASER;  Surgeon: Jalene Mullet, MD;  Location: [place];  Service: Ophthalmology;  Laterality: Left;   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   Procedure: REPAIR OF COMPLEX TRACTION RETINAL DETACHMENT;  Surgeon: Jalene Mullet, MD;  Location: [place];  Service: Ophthalmology;  Laterality: Left;   REVISION OF ARTERIOVENOUS GORETEX GRAFT Left 09/05/2020   Procedure: REVISION OF ARTERIOVENOUS GORETEX GRAFT LEFT;  Surgeon: Cherre Robins, MD;  Location: [facility];  Service: Vascular;  Laterality: Left;    FAMILY HISTORY Family History  Problem Relation Age of Onset   Other Mother        not sure of cause of death   Diabetes Father    Diabetes Sister    Diabetes Sister    Pancreatic cancer Maternal Grandmother    Colon cancer Neg Hx     SOCIAL HISTORY Social History   Tobacco Use   Smoking status: Former    Packs/day: 0.35    Years: 40.00    Pack years: 14.00    Types: Cigarettes    Quit date: 05/10/2012    Years since quitting: 9.3   Smokeless tobacco: Never  Vaping Use   Vaping Use: Never used  Substance Use Topics   Alcohol use: No   Drug use: No    Comment: marijuana; quit in early 1980's         OPHTHALMIC EXAM:  Base Eye Exam     Visual Acuity (ETDRS)       Right Left   Dist [place] 20/60 -1 20/100 -1   Dist ph [place] NI NI         Tonometry (Tonopen, 1:18 PM)       Right Left   Pressure 13 14         Pupils       Pupils Dark Light Shape React APD   Right PERRL 3 3 Round Minimal None   Left PERRL 3 3 Round Minimal None         Extraocular Movement       Right Left    Full Full  Neuro/Psych     Oriented x3: Yes   Mood/Affect: Normal         Dilation     Left eye: 1.0% Mydriacyl, 2.5% Phenylephrine @ 1:18 PM           Slit Lamp and Fundus Exam     External Exam       Right Left   External Normal Normal         Slit Lamp Exam       Right Left   Lids/Lashes Normal Normal   Conjunctiva/Sclera  White and quiet White and quiet   Cornea Clear Clear   Anterior Chamber Deep and quiet Deep and quiet   Iris Round and reactive Round and reactive   Lens Centered posterior chamber intraocular lens Centered posterior chamber intraocular lens   Anterior Vitreous Normal Normal, remnant         Fundus Exam       Right Left   Posterior Vitreous  Clear, avitric   Disc  1+ Pallor   C/D Ratio  0.5   Macula  Severe clinically significant macular edema appears to emanate from region temporal aspect of the fovea with large cluster of microaneurysms   Vessels  Clear resection of previous fibrovascular disease clear avitric   Periphery  Good peripheral PRP            IMAGING AND PROCEDURES  Imaging and Procedures for 09/11/21  Intravitreal Injection, Pharmacologic Agent - OS - Left Eye       Time Out 09/11/2021. 2:12 PM. Confirmed correct patient, procedure, site, and patient consented.   Anesthesia Topical anesthesia was used. Anesthetic medications included Lidocaine 4%.   Procedure Preparation included Tobramycin 0.3%, 5% betadine to ocular surface, 10% betadine to eyelids. A 30 gauge needle was used.   Injection: 0.7 mg dexamethasone 0.7 MG   Route: Intravitreal, Site: Left Eye   NDC: [phone], Lot: [id number], Waste: 0 mg   Post-op Post injection exam found visual acuity of at least counting fingers. The patient tolerated the procedure well. There were no complications. The patient received written and verbal post procedure care education. Post injection medications were not given.      OCT, Retina - OU - Both Eyes       Right Eye Quality was good. Scan locations included subfoveal. Central Foveal Thickness: 514. Findings include epiretinal membrane.   Left Eye Quality was good. Scan locations included subfoveal. Central Foveal Thickness: 696. Progression has improved.   Notes OS with worsened  CSME yet still center involved.  Will need repeat  steroid therapy  OS to resolve the CME  OD vitreomacular traction syndrome improved post vitrectomy with improved acuity, new epiretinal membrane has newly developed with foveal distortion will monitor             ASSESSMENT/PLAN:  Macular pucker, right eye OD, epiretinal membrane but no CME.  Epiretinal membrane present and will likely need vitrectomy membrane peel.  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 Macular pucker, right eye  H35.371       1.  Focal combined PRP left eye in 2 weeks once CSME has improved verified by OCT next visit post Ozurdex today  2.  We will begin further discussions about proceeding with vitrectomy membrane peel right eye under local MAC anesthesia at [facility] health, [facility]  3.  Ophthalmic Meds Ordered this visit:  Meds ordered this encounter  Medications   dexamethasone (OZURDEX) 0.7 MG intravitreal implant 0.7 mg       Return in about 2 weeks (around 09/25/2021) for dilate, OS, PRP, OCT.  There are no Patient Instructions on file for this visit.   Explained the diagnoses, plan, and follow up with the patient and they expressed understanding.  Patient expressed understanding of the importance of proper follow up care.   Clent Demark Odilon Cass M.D. Diseases & Surgery of the Retina and Vitreous [facility] [place] 09/11/21     Abbreviations: M myopia (nearsighted); A astigmatism; H hyperopia (farsighted); P presbyopia; Mrx spectacle prescription;  CTL contact lenses; OD right eye; OS left eye; OU both eyes  XT exotropia; ET esotropia; PEK punctate epithelial keratitis; PEE punctate epithelial erosions; DES dry eye syndrome; MGD meibomian gland dysfunction; ATs artificial tears; PFAT's preservative free artificial tears; [place] nuclear sclerotic cataract; PSC posterior subcapsular cataract; ERM epi-retinal membrane; PVD posterior vitreous detachment; RD retinal detachment; DM diabetes mellitus; DR diabetic retinopathy; NPDR non-proliferative diabetic retinopathy; PDR proliferative diabetic retinopathy; CSME clinically significant macular edema; DME diabetic macular edema; dbh dot blot hemorrhages; CWS cotton wool spot; POAG primary open angle glaucoma; C/D cup-to-disc ratio; HVF humphrey visual field; GVF goldmann visual field; OCT optical coherence tomography; IOP intraocular pressure; BRVO Branch retinal vein occlusion; CRVO central retinal vein occlusion; CRAO central retinal artery occlusion; BRAO branch retinal artery occlusion; RT retinal tear; SB scleral buckle; PPV pars plana vitrectomy; VH Vitreous hemorrhage; PRP panretinal laser photocoagulation; IVK intravitreal kenalog; VMT vitreomacular traction; MH Macular hole;  NVD neovascularization of the disc; NVE neovascularization elsewhere; AREDS age related eye disease study; ARMD age related macular degeneration; POAG primary open angle glaucoma; EBMD epithelial/anterior basement membrane dystrophy; ACIOL anterior chamber intraocular lens; IOL intraocular lens; PCIOL posterior chamber intraocular lens; Phaco/IOL phacoemulsification with intraocular lens placement; [place] photorefractive keratectomy; LASIK laser assisted in situ keratomileusis; HTN hypertension; DM diabetes mellitus; COPD chronic obstructive pulmonary disease

## 2021-09-11 NOTE — Assessment & Plan Note (Signed)
OD, epiretinal membrane but no CME.  Epiretinal membrane present and will likely need vitrectomy membrane peel.  Patient wants to be "put to sleep"

## 2021-09-12 DIAGNOSIS — N186 End stage renal disease: Secondary | ICD-10-CM | POA: Diagnosis not present

## 2021-09-12 DIAGNOSIS — Z992 Dependence on renal dialysis: Secondary | ICD-10-CM | POA: Diagnosis not present

## 2021-09-12 DIAGNOSIS — D631 Anemia in chronic kidney disease: Secondary | ICD-10-CM | POA: Diagnosis not present

## 2021-09-12 DIAGNOSIS — T7840XA Allergy, unspecified, initial encounter: Secondary | ICD-10-CM | POA: Diagnosis not present

## 2021-09-12 DIAGNOSIS — D509 Iron deficiency anemia, unspecified: Secondary | ICD-10-CM | POA: Diagnosis not present

## 2021-09-12 DIAGNOSIS — N2581 Secondary hyperparathyroidism of renal origin: Secondary | ICD-10-CM | POA: Diagnosis not present

## 2021-09-15 DIAGNOSIS — Z992 Dependence on renal dialysis: Secondary | ICD-10-CM | POA: Diagnosis not present

## 2021-09-15 DIAGNOSIS — T7840XA Allergy, unspecified, initial encounter: Secondary | ICD-10-CM | POA: Diagnosis not present

## 2021-09-15 DIAGNOSIS — N186 End stage renal disease: Secondary | ICD-10-CM | POA: Diagnosis not present

## 2021-09-15 DIAGNOSIS — N2581 Secondary hyperparathyroidism of renal origin: Secondary | ICD-10-CM | POA: Diagnosis not present

## 2021-09-15 DIAGNOSIS — D509 Iron deficiency anemia, unspecified: Secondary | ICD-10-CM | POA: Diagnosis not present

## 2021-09-15 DIAGNOSIS — D631 Anemia in chronic kidney disease: Secondary | ICD-10-CM | POA: Diagnosis not present

## 2021-09-16 ENCOUNTER — Ambulatory Visit (INDEPENDENT_AMBULATORY_CARE_PROVIDER_SITE_OTHER): Payer: Medicare Other

## 2021-09-16 DIAGNOSIS — J309 Allergic rhinitis, unspecified: Secondary | ICD-10-CM

## 2021-09-17 DIAGNOSIS — N2581 Secondary hyperparathyroidism of renal origin: Secondary | ICD-10-CM | POA: Diagnosis not present

## 2021-09-17 DIAGNOSIS — N186 End stage renal disease: Secondary | ICD-10-CM | POA: Diagnosis not present

## 2021-09-17 DIAGNOSIS — Z992 Dependence on renal dialysis: Secondary | ICD-10-CM | POA: Diagnosis not present

## 2021-09-17 DIAGNOSIS — T7840XA Allergy, unspecified, initial encounter: Secondary | ICD-10-CM | POA: Diagnosis not present

## 2021-09-17 DIAGNOSIS — D509 Iron deficiency anemia, unspecified: Secondary | ICD-10-CM | POA: Diagnosis not present

## 2021-09-17 DIAGNOSIS — D631 Anemia in chronic kidney disease: Secondary | ICD-10-CM | POA: Diagnosis not present

## 2021-09-19 DIAGNOSIS — D509 Iron deficiency anemia, unspecified: Secondary | ICD-10-CM | POA: Diagnosis not present

## 2021-09-19 DIAGNOSIS — N186 End stage renal disease: Secondary | ICD-10-CM | POA: Diagnosis not present

## 2021-09-19 DIAGNOSIS — T7840XA Allergy, unspecified, initial encounter: Secondary | ICD-10-CM | POA: Diagnosis not present

## 2021-09-19 DIAGNOSIS — Z992 Dependence on renal dialysis: Secondary | ICD-10-CM | POA: Diagnosis not present

## 2021-09-19 DIAGNOSIS — D631 Anemia in chronic kidney disease: Secondary | ICD-10-CM | POA: Diagnosis not present

## 2021-09-19 DIAGNOSIS — N2581 Secondary hyperparathyroidism of renal origin: Secondary | ICD-10-CM | POA: Diagnosis not present

## 2021-09-22 DIAGNOSIS — Z992 Dependence on renal dialysis: Secondary | ICD-10-CM | POA: Diagnosis not present

## 2021-09-22 DIAGNOSIS — T7840XA Allergy, unspecified, initial encounter: Secondary | ICD-10-CM | POA: Diagnosis not present

## 2021-09-22 DIAGNOSIS — N2581 Secondary hyperparathyroidism of renal origin: Secondary | ICD-10-CM | POA: Diagnosis not present

## 2021-09-22 DIAGNOSIS — N186 End stage renal disease: Secondary | ICD-10-CM | POA: Diagnosis not present

## 2021-09-22 DIAGNOSIS — D631 Anemia in chronic kidney disease: Secondary | ICD-10-CM | POA: Diagnosis not present

## 2021-09-22 DIAGNOSIS — D509 Iron deficiency anemia, unspecified: Secondary | ICD-10-CM | POA: Diagnosis not present

## 2021-09-23 ENCOUNTER — Ambulatory Visit (INDEPENDENT_AMBULATORY_CARE_PROVIDER_SITE_OTHER): Payer: Medicare Other

## 2021-09-23 DIAGNOSIS — J309 Allergic rhinitis, unspecified: Secondary | ICD-10-CM

## 2021-09-24 DIAGNOSIS — Z992 Dependence on renal dialysis: Secondary | ICD-10-CM | POA: Diagnosis not present

## 2021-09-24 DIAGNOSIS — N186 End stage renal disease: Secondary | ICD-10-CM | POA: Diagnosis not present

## 2021-09-24 DIAGNOSIS — N2581 Secondary hyperparathyroidism of renal origin: Secondary | ICD-10-CM | POA: Diagnosis not present

## 2021-09-24 DIAGNOSIS — I159 Secondary hypertension, unspecified: Secondary | ICD-10-CM | POA: Diagnosis not present

## 2021-09-24 DIAGNOSIS — D631 Anemia in chronic kidney disease: Secondary | ICD-10-CM | POA: Diagnosis not present

## 2021-09-24 DIAGNOSIS — E876 Hypokalemia: Secondary | ICD-10-CM | POA: Diagnosis not present

## 2021-09-25 ENCOUNTER — Ambulatory Visit (INDEPENDENT_AMBULATORY_CARE_PROVIDER_SITE_OTHER): Payer: Medicare Other | Admitting: Ophthalmology

## 2021-09-25 ENCOUNTER — Other Ambulatory Visit: Payer: Self-pay

## 2021-09-25 ENCOUNTER — Encounter (INDEPENDENT_AMBULATORY_CARE_PROVIDER_SITE_OTHER): Payer: Self-pay | Admitting: Ophthalmology

## 2021-09-25 DIAGNOSIS — E113512 Type 2 diabetes mellitus with proliferative diabetic retinopathy with macular edema, left eye: Secondary | ICD-10-CM

## 2021-09-25 NOTE — Progress Notes (Signed)
09/25/2021     CHIEF COMPLAINT Patient presents for  Chief Complaint  Patient presents with   Diabetic Retinopathy with Macular Edema      HISTORY OF PRESENT ILLNESS: Destiny Day is a 70 y.o. female who presents to the clinic today for:   HPI   2 weeks dilate OS PRP OCT. Patient states vision is stable and unchanged since last visit. Denies any new floaters or FOL.  Last edited by Laurin Coder on 09/25/2021 11:08 AM.      Referring physician: Cipriano Mile, NP [place],  [place] [address]  HISTORICAL INFORMATION:   Selected notes from the MEDICAL RECORD NUMBER    Lab Results  Component Value Date   HGBA1C 6.4 (H) 08/04/2021     CURRENT MEDICATIONS: No current outpatient medications on file. (Ophthalmic Drugs)   No current facility-administered medications for this visit. (Ophthalmic Drugs)   Current Outpatient Medications (Other)  Medication Sig   albuterol (PROAIR HFA) 108 (90 Base) MCG/ACT inhaler Inhale 2 puffs into the lungs every 4 (four) hours as needed for wheezing or shortness of breath.   amLODipine (NORVASC) 10 MG tablet Take 10 mg by mouth at bedtime.   aspirin EC 81 MG tablet Take 81 mg by mouth daily.   atorvastatin (LIPITOR) 80 MG tablet Take 80 mg by mouth at bedtime.   busPIRone (BUSPAR) 7.5 MG tablet Take 7.5 mg by mouth 2 (two) times daily as needed (anxiety).   carvedilol (COREG) 12.5 MG tablet Take 12.5 mg by mouth See admin instructions. Take 1 tablet (12.5 mg) by mouth once daily in the evening on Mondays, Wednesdays, & Fridays. (Dialysis) Take 1 tablet (12.5 mg) by mouth twice daily on Sundays, Tuesdays, Thursdays, & Saturdays. (Non Dialysis)   cinacalcet (SENSIPAR) 90 MG tablet Take 90 mg by mouth See admin instructions. Take 1 tablet (90 mg) by mouth in the evening on Mondays, Wednesdays, & Fridays (dialysis) Take 1 tablet (90 mg) by mouth twice daily on Sundays, Tuesdays, Thursdays, Saturdays. (non dialysis)    EPINEPHrine (AUVI-Q) 0.3 mg/0.3 mL IJ SOAJ injection Inject 0.3 mg into the muscle as needed for anaphylaxis.   ferric citrate (AURYXIA) 1 GM 210 MG(Fe) tablet Take 210-420 mg by mouth See admin instructions. Take 2 tablets (420 mg) by mouth with each meal & take 1 tablet (210 mg) by mouth with snacks.   fluticasone (FLONASE) 50 MCG/ACT nasal spray Place 2 sprays into both nostrils daily as needed for allergies or rhinitis.   fluticasone-salmeterol (ADVAIR HFA) 115-21 MCG/ACT inhaler Inhale two puffs twice daily (Patient not taking: Reported on 08/03/2021)   gabapentin (NEURONTIN) 100 MG capsule TAKE 1 CAPSULE (100 MG TOTAL) BY MOUTH AT BEDTIME. WHEN NECESSARY FOR NEUROPATHY PAIN (Patient taking differently: Take 100 mg by mouth at bedtime as needed (pain). When necessary for neuropathy pain)   glipiZIDE (GLUCOTROL) 5 MG tablet Take 5 mg by mouth daily before breakfast.   guaiFENesin-codeine 100-10 MG/5ML syrup Take 5 mLs by mouth 3 (three) times daily as needed for cough.   isosorbide-hydrALAZINE (BIDIL) 20-37.5 MG tablet Take 1 tablet by mouth 3 (three) times daily. BID on days of dialysis (Patient taking differently: Take 1 tablet by mouth See admin instructions. hs on Monday,Wednesday,Friday (dialysis days) Tid on Tuesday,Thursday,Saturday,Sunday(non-dialysis days))   lidocaine-prilocaine (EMLA) cream Apply 1 application topically 3 (three) times a week. 1-2 hours prior to dialysis   loperamide (IMODIUM) 2 MG capsule Take 2 capsules (4 mg total) by mouth 3 (  three) times daily as needed for diarrhea or loose stools.   montelukast (SINGULAIR) 10 MG tablet TAKE 1 TABLET BY MOUTH EVERYDAY AT BEDTIME   omeprazole (PRILOSEC) 20 MG capsule Take 20 mg by mouth daily.    ondansetron (ZOFRAN) 4 MG tablet Take 1 tablet (4 mg total) by mouth every 6 (six) hours as needed for nausea.   polyethylene glycol (MIRALAX / GLYCOLAX) 17 g packet Take 17 g by mouth daily. (Patient taking differently: Take 17 g by mouth  daily as needed for mild constipation.)   Spacer/Aero-Holding Chambers (AEROCHAMBER PLUS) inhaler Use with inhaler   traZODone (DESYREL) 50 MG tablet Take 50 mg by mouth at bedtime as needed for sleep.   Vitamin D, Ergocalciferol, (DRISDOL) 1.25 MG (50000 UT) CAPS capsule Take 50,000 Units by mouth every Sunday. Takes on Sunday (Patient not taking: Reported on 08/03/2021)   No current facility-administered medications for this visit. (Other)      REVIEW OF SYSTEMS:    ALLERGIES Allergies  Allergen Reactions   Adhesive  [Tape] Hives   Lisinopril Cough   Prednisone Swelling and Other (See Comments)    Excessive fluid buildup    PAST MEDICAL HISTORY Past Medical History:  Diagnosis Date   Abdominal bruit    Anxiety    Arthritis    Osteoarthritis   Asthma    Cervical disc disease    "pinced nerve"   CHF (congestive heart failure) (HCC)    Diabetes mellitus    Type II   Diverticulitis    ESRD (end stage renal disease) ([place])    dialysis - M/W/F- [place]   GERD (gastroesophageal reflux disease)    from medications   GI bleed 03/31/2013   Head injury 07/2017   History of hiatal hernia    Hyperlipidemia    Hypertension    Neuropathy    left leg   Nuclear sclerotic cataract of right eye 04/23/2020   Osteoporosis    Peripheral vascular disease ([place])    Pneumonia    "very young" and a few years ago   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)    Vitrectomy membrane peel endolaser 05-07-2021   Seasonal allergies    Shortness of breath dyspnea    WIth exertion, when fluid builds   Sleep apnea    can't afford cpap    Vitreomacular traction syndrome, 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   05-07-2021  Vitrectomy, membrane peel, PRP, resection posterior segment of NVD, remnants of peripheral NVE remain in place due to atrophic retina, no vitreous substitute   Past Surgical History:  Procedure Laterality Date   A/V SHUNTOGRAM  N/A 09/22/2016   Procedure: A/V Shuntogram - left arm;  Surgeon: Serafina Mitchell, MD;  Location: [facility];  Service: Cardiovascular;  Laterality: N/A;   A/V SHUNTOGRAM N/A 03/22/2018   Procedure: A/V SHUNTOGRAM - left arm;  Surgeon: Serafina Mitchell, MD;  Location: [facility];  Service: Cardiovascular;  Laterality: N/A;   A/V SHUNTOGRAM Left 11/17/2018   Procedure: A/V SHUNTOGRAM;  Surgeon: Angelia Mould, MD;  Location: [facility];  Service: Cardiovascular;  Laterality: Left;   ABDOMINAL HYSTERECTOMY  1993`   AMPUTATION Left 09/01/2016   Procedure: LEFT FOOT TRANSMETATARSAL AMPUTATION;  Surgeon: Newt Minion, MD;  Location: [facility];  Service: Orthopedics;  Laterality: Left;   AMPUTATION Right 08/11/2017   Procedure: RIGHT GREAT TOE AMPUTATION DIGIT;  Surgeon: Rosetta Posner, MD;  Location:  [facility];  Service: Vascular;  Laterality: Right;   AMPUTATION Right 12/31/2017   Procedure: RIGHT TRANSMETATARSAL AMPUTATION;  Surgeon: Newt Minion, MD;  Location: [facility];  Service: Orthopedics;  Laterality: Right;   AV FISTULA PLACEMENT Left 04/21/2016   Procedure: INSERTION OF ARTERIOVENOUS (AV) GORE-TEX GRAFT ARM LEFT;  Surgeon: Elam Dutch, MD;  Location: [facility];  Service: Vascular;  Laterality: Left;   BASCILIC VEIN TRANSPOSITION Left 07/10/2014   Procedure: BASCILIC VEIN TRANSPOSITION;  Surgeon: Angelia Mould, MD;  Location: [facility];  Service: Vascular;  Laterality: Left;   [place] Right 11/08/2014   Procedure: FIRST STAGE BASILIC VEIN TRANSPOSITION;  Surgeon: Angelia Mould, MD;  Location: [facility];  Service: Vascular;  Laterality: Right;   [place] Right 01/18/2015   Procedure: SECOND STAGE BASILIC VEIN TRANSPOSITION;  Surgeon: Angelia Mould, MD;  Location: [facility];  Service: Vascular;  Laterality: Right;   CRANIOTOMY N/A 08/23/2017   Procedure: CRANIOTOMY HEMATOMA EVACUATION SUBDURAL;  Surgeon: Ashok Pall, MD;   Location: [facility];  Service: Neurosurgery;  Laterality: N/A;   CRANIOTOMY Left 08/24/2017   Procedure: CRANIOTOMY FOR RECURRENT ACUTE SUBDURAL HEMATOMA;  Surgeon: Ashok Pall, MD;  Location: [facility];  Service: Neurosurgery;  Laterality: Left;   ESOPHAGOGASTRODUODENOSCOPY N/A 03/31/2013   Procedure: ESOPHAGOGASTRODUODENOSCOPY (EGD);  Surgeon: Gatha Mayer, MD;  [facility];  Service: Endoscopy;  Laterality: N/A;   EYE SURGERY     laser surgery   FEMORAL-POPLITEAL BYPASS GRAFT Left 07/08/2016   Procedure: LEFT  FEMORAL-BELOW KNEE POPLITEAL ARTERY BYPASS GRAFT USING 6MM X 80 CM PROPATEN GORETEX GRAFT WITH RINGS.;  Surgeon: Rosetta Posner, MD;  Location: [facility];  Service: Vascular;  Laterality: Left;   FEMORAL-POPLITEAL BYPASS GRAFT Right 08/11/2017   Procedure: RIGHT FEMORAL TO BELOW KNEE POPLITEAL ARTERKY  BYPASS GRAFT USING 6MM RINGED PROPATEN GRAFT;  Surgeon: Rosetta Posner, MD;  Location: [facility];  Service: Vascular;  Laterality: Right;   FISTULOGRAM Left 10/29/2014   Procedure: FISTULOGRAM;  Surgeon: Angelia Mould, MD;  Location: [facility] CATH LAB;  Service: Cardiovascular;  Laterality: Left;   GAS/FLUID EXCHANGE Left 12/20/2018   Procedure: AIR GAS EXCHANGE;  Surgeon: Jalene Mullet, MD;  Location: [facility];  Service: Ophthalmology;  Laterality: Left;   INSERTION OF DIALYSIS CATHETER N/A 09/05/2020   Procedure: INSERTION OF DIALYSIS CATHETER;  Surgeon: Cherre Robins, MD;  Location: [facility];  Service: Vascular;  Laterality: N/A;   INSERTION OF DIALYSIS CATHETER Left 03/13/2021   Procedure: INSERTION OF DIALYSIS CATHETER;  Surgeon: Waynetta Sandy, MD;  Location: [facility];  Service: Vascular;  Laterality: Left;   KNEE ARTHROSCOPY Right 05/06/2018   Procedure: RIGHT KNEE ARTHROSCOPY;  Surgeon: Newt Minion, MD;  Location: [facility];  Service: Orthopedics;  Laterality: Right;   KNEE ARTHROSCOPY Right 06/10/2018   Procedure: RIGHT KNEE ARTHROSCOPY AND DEBRIDEMENT;  Surgeon: Newt Minion, MD;   Location: [facility];  Service: Orthopedics;  Laterality: Right;   LIGATION OF ARTERIOVENOUS  FISTULA Left 04/21/2016   Procedure: LIGATION OF ARTERIOVENOUS  FISTULA LEFT ARM;  Surgeon: Elam Dutch, MD;  Location: [facility];  Service: Vascular;  Laterality: Left;   LOWER EXTREMITY ANGIOGRAPHY N/A 04/06/2017   Procedure: Lower Extremity Angiography - Right;  Surgeon: Serafina Mitchell, MD;  Location: [facility];  Service: Cardiovascular;  Laterality: N/A;   MEMBRANE PEEL Left 12/20/2018   Procedure: MEMBRANE PEEL;  Surgeon: Jalene Mullet, MD;  Location: [facility];  Service: Ophthalmology;  Laterality: Left;   PATCH ANGIOPLASTY Right 01/18/2015   Procedure: BASILIC VEIN PATCH ANGIOPLASTY USING VASCUGUARD PATCH;  Surgeon: Angelia Mould, MD;  Location: [facility];  Service: Vascular;  Laterality: Right;   PERIPHERAL VASCULAR BALLOON ANGIOPLASTY  09/22/2016   Procedure: Peripheral Vascular Balloon Angioplasty;  Surgeon: Serafina Mitchell, MD;  Location: [facility];  Service: Cardiovascular;;  Lt. Fistula   PERIPHERAL VASCULAR BALLOON ANGIOPLASTY Left 03/22/2018   Procedure: PERIPHERAL VASCULAR BALLOON ANGIOPLASTY;  Surgeon: Serafina Mitchell, MD;  Location: [facility];  Service: Cardiovascular;  Laterality: Left;  Arm shunt   PERIPHERAL VASCULAR CATHETERIZATION N/A 06/23/2016   Procedure: Abdominal Aortogram w/Lower Extremity;  Surgeon: Serafina Mitchell, MD;  Location: [facility];  Service: Cardiovascular;  Laterality: N/A;   PERIPHERAL VASCULAR CATHETERIZATION  06/23/2016   Procedure: Peripheral Vascular Intervention;  Surgeon: Serafina Mitchell, MD;  Location: [facility];  Service: Cardiovascular;;  lt common and external illiac artery   PHOTOCOAGULATION WITH LASER Left 12/20/2018   Procedure: PHOTOCOAGULATION WITH LASER;  Surgeon: Jalene Mullet, MD;  Location: [facility];  Service: Ophthalmology;  Laterality: Left;   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    Procedure: REPAIR OF COMPLEX TRACTION RETINAL DETACHMENT;  Surgeon: Jalene Mullet, MD;  Location: [facility];  Service: Ophthalmology;  Laterality: Left;   REVISION OF ARTERIOVENOUS GORETEX GRAFT Left 09/05/2020   Procedure: REVISION OF ARTERIOVENOUS GORETEX GRAFT LEFT;  Surgeon: Cherre Robins, MD;  Location: [facility];  Service: Vascular;  Laterality: Left;    FAMILY HISTORY Family History  Problem Relation Age of Onset   Other Mother        not sure of cause of death   Diabetes Father    Diabetes Sister    Diabetes Sister    Pancreatic cancer Maternal Grandmother    Colon cancer Neg Hx     SOCIAL HISTORY Social History   Tobacco Use   Smoking status: Former    Packs/day: 0.35    Years: 40.00    Pack years: 14.00    Types: Cigarettes    Quit date: 05/10/2012    Years since quitting: 9.3   Smokeless tobacco: Never  Vaping Use   Vaping Use: Never used  Substance Use Topics   Alcohol use: No   Drug use: No    Comment: marijuana; quit in early 1980's         OPHTHALMIC EXAM:  Base Eye Exam     Visual Acuity (ETDRS)       Right Left   Dist [place] 20/60 +1 20/80 -1   Dist ph [place] NI NI         Tonometry (Tonopen, 10:55 AM)       Right Left   Pressure 17 24         Pupils       Pupils Dark Light React APD   Right PERRL 3 3 Minimal None   Left PERRL 3 3 Minimal None         Extraocular Movement       Right Left    Full Full         Neuro/Psych     Oriented x3: Yes   Mood/Affect: Normal         Dilation     Left eye: 1.0% Mydriacyl, 2.5% Phenylephrine @ 10:55 AM           Slit Lamp and Fundus  Exam     External Exam       Right Left   External Normal Normal         Slit Lamp Exam       Right Left   Lids/Lashes Normal Normal   Conjunctiva/Sclera White and quiet White and quiet   Cornea Clear Clear   Anterior Chamber Deep and quiet Deep and quiet   Iris Round and reactive Round and reactive   Lens Centered posterior chamber  intraocular lens Centered posterior chamber intraocular lens   Anterior Vitreous Normal Normal, remnant         Fundus Exam       Right Left   Posterior Vitreous  Clear, avitric   Disc  1+ Pallor   C/D Ratio  0.5   Macula  Severe clinically significant macular edema appears to emanate from region temporal aspect of the fovea with large cluster of microaneurysms   Vessels  Clear resection of previous fibrovascular disease clear avitric   Periphery  Good peripheral PRP            IMAGING AND PROCEDURES  Imaging and Procedures for 09/25/21  OCT, Retina - OU - Both Eyes       Right Eye Quality was good. Scan locations included subfoveal. Central Foveal Thickness: 514. Findings include epiretinal membrane.   Left Eye Quality was good. Scan locations included subfoveal. Central Foveal Thickness: 496. Progression has improved.   Notes OS much improved macular edema OS post recent injection of Ozurdex, Will continue with therapy today by the delivery of peripheral PRP decrease vegF burden OD vitreomacular traction syndrome improved post vitrectomy with improved acuity, new epiretinal membrane has newly developed with foveal distortion will monitor     Panretinal Photocoagulation - OS - Left Eye       Time Out Confirmed correct patient, procedure, site, and patient consented.   Anesthesia Topical anesthesia was used. Anesthetic medications included Proparacaine 0.5%.   Laser Information The type of laser was diode. Color was yellow. The duration in seconds was 0.02. The spot size was 390 microns. Laser power was 260. Total spots was 584.   Post-op The patient tolerated the procedure well. There were no complications. The patient received written and verbal post procedure care education.              ASSESSMENT/PLAN:  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 type 2 diabetes mellitus ([place]) 2 weeks post most recent  injection, does not for center involved CSME not responsive to antiemetics.  Will need peripheral PRP completed today to decrease vegF burden   OS, peripheral PRP completed superonasal and superotemporal     ICD-10-CM   1. 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  Additional PRP delivered superonasal and superotemporal today to decrease vegF burden large areas of ischemic retina.  2.  3.  Ophthalmic Meds Ordered this visit:  No orders of the defined types were placed in this encounter.      Return in about 3 weeks (around 10/16/2021) for IOP check TECH only, with Dr. Zadie Rhine in the office.  There are no Patient Instructions on file for this visit.   Explained the diagnoses, plan, and follow up with the patient and they expressed understanding.  Patient expressed  understanding of the importance of proper follow up care.   Clent Demark Geraldyn Shain M.D. Diseases & Surgery of the Retina and Vitreous Retina & Diabetic [place] 09/25/21     Abbreviations: M myopia (nearsighted); A astigmatism; H hyperopia (farsighted); P presbyopia; Mrx spectacle prescription;  CTL contact lenses; OD right eye; OS left eye; OU both eyes  XT exotropia; ET esotropia; PEK punctate epithelial keratitis; PEE punctate epithelial erosions; DES dry eye syndrome; MGD meibomian gland dysfunction; ATs artificial tears; PFAT's preservative free artificial tears; [place] nuclear sclerotic cataract; PSC posterior subcapsular cataract; ERM epi-retinal membrane; PVD posterior vitreous detachment; RD retinal detachment; DM diabetes mellitus; DR diabetic retinopathy; NPDR non-proliferative diabetic retinopathy; PDR proliferative diabetic retinopathy; CSME clinically significant macular edema; DME diabetic macular edema; dbh dot blot hemorrhages; CWS cotton wool spot;  POAG primary open angle glaucoma; C/D cup-to-disc ratio; HVF humphrey visual field; GVF goldmann visual field; OCT optical coherence tomography; IOP intraocular pressure; BRVO Branch retinal vein occlusion; CRVO central retinal vein occlusion; CRAO central retinal artery occlusion; BRAO branch retinal artery occlusion; RT retinal tear; SB scleral buckle; PPV pars plana vitrectomy; VH Vitreous hemorrhage; PRP panretinal laser photocoagulation; IVK intravitreal kenalog; VMT vitreomacular traction; MH Macular hole;  NVD neovascularization of the disc; NVE neovascularization elsewhere; AREDS age related eye disease study; ARMD age related macular degeneration; POAG primary open angle glaucoma; EBMD epithelial/anterior basement membrane dystrophy; ACIOL anterior chamber intraocular lens; IOL intraocular lens; PCIOL posterior chamber intraocular lens; Phaco/IOL phacoemulsification with intraocular lens placement; [place] photorefractive keratectomy; LASIK laser assisted in situ keratomileusis; HTN hypertension; DM diabetes mellitus; COPD chronic obstructive pulmonary disease

## 2021-09-25 NOTE — Assessment & Plan Note (Addendum)
2 weeks post most recent injection, does not for center involved CSME not responsive to antiemetics.  Will need peripheral PRP completed today to decrease vegF burden ? ? ?OS, peripheral PRP completed superonasal and superotemporal ?

## 2021-09-26 DIAGNOSIS — E876 Hypokalemia: Secondary | ICD-10-CM | POA: Diagnosis not present

## 2021-09-26 DIAGNOSIS — D631 Anemia in chronic kidney disease: Secondary | ICD-10-CM | POA: Diagnosis not present

## 2021-09-26 DIAGNOSIS — N2581 Secondary hyperparathyroidism of renal origin: Secondary | ICD-10-CM | POA: Diagnosis not present

## 2021-09-26 DIAGNOSIS — N186 End stage renal disease: Secondary | ICD-10-CM | POA: Diagnosis not present

## 2021-09-26 DIAGNOSIS — Z992 Dependence on renal dialysis: Secondary | ICD-10-CM | POA: Diagnosis not present

## 2021-09-29 DIAGNOSIS — N186 End stage renal disease: Secondary | ICD-10-CM | POA: Diagnosis not present

## 2021-09-29 DIAGNOSIS — E876 Hypokalemia: Secondary | ICD-10-CM | POA: Diagnosis not present

## 2021-09-29 DIAGNOSIS — N2581 Secondary hyperparathyroidism of renal origin: Secondary | ICD-10-CM | POA: Diagnosis not present

## 2021-09-29 DIAGNOSIS — Z992 Dependence on renal dialysis: Secondary | ICD-10-CM | POA: Diagnosis not present

## 2021-09-29 DIAGNOSIS — D631 Anemia in chronic kidney disease: Secondary | ICD-10-CM | POA: Diagnosis not present

## 2021-09-30 ENCOUNTER — Ambulatory Visit (INDEPENDENT_AMBULATORY_CARE_PROVIDER_SITE_OTHER): Payer: Medicare Other

## 2021-09-30 DIAGNOSIS — J309 Allergic rhinitis, unspecified: Secondary | ICD-10-CM

## 2021-10-01 DIAGNOSIS — Z992 Dependence on renal dialysis: Secondary | ICD-10-CM | POA: Diagnosis not present

## 2021-10-01 DIAGNOSIS — N2581 Secondary hyperparathyroidism of renal origin: Secondary | ICD-10-CM | POA: Diagnosis not present

## 2021-10-01 DIAGNOSIS — N186 End stage renal disease: Secondary | ICD-10-CM | POA: Diagnosis not present

## 2021-10-01 DIAGNOSIS — D631 Anemia in chronic kidney disease: Secondary | ICD-10-CM | POA: Diagnosis not present

## 2021-10-01 DIAGNOSIS — E876 Hypokalemia: Secondary | ICD-10-CM | POA: Diagnosis not present

## 2021-10-03 DIAGNOSIS — Z992 Dependence on renal dialysis: Secondary | ICD-10-CM | POA: Diagnosis not present

## 2021-10-03 DIAGNOSIS — N2581 Secondary hyperparathyroidism of renal origin: Secondary | ICD-10-CM | POA: Diagnosis not present

## 2021-10-03 DIAGNOSIS — D631 Anemia in chronic kidney disease: Secondary | ICD-10-CM | POA: Diagnosis not present

## 2021-10-03 DIAGNOSIS — N186 End stage renal disease: Secondary | ICD-10-CM | POA: Diagnosis not present

## 2021-10-03 DIAGNOSIS — E876 Hypokalemia: Secondary | ICD-10-CM | POA: Diagnosis not present

## 2021-10-06 DIAGNOSIS — N186 End stage renal disease: Secondary | ICD-10-CM | POA: Diagnosis not present

## 2021-10-06 DIAGNOSIS — D631 Anemia in chronic kidney disease: Secondary | ICD-10-CM | POA: Diagnosis not present

## 2021-10-06 DIAGNOSIS — E876 Hypokalemia: Secondary | ICD-10-CM | POA: Diagnosis not present

## 2021-10-06 DIAGNOSIS — N2581 Secondary hyperparathyroidism of renal origin: Secondary | ICD-10-CM | POA: Diagnosis not present

## 2021-10-06 DIAGNOSIS — Z992 Dependence on renal dialysis: Secondary | ICD-10-CM | POA: Diagnosis not present

## 2021-10-07 ENCOUNTER — Ambulatory Visit (INDEPENDENT_AMBULATORY_CARE_PROVIDER_SITE_OTHER): Payer: Medicare Other

## 2021-10-07 DIAGNOSIS — J309 Allergic rhinitis, unspecified: Secondary | ICD-10-CM

## 2021-10-08 DIAGNOSIS — D631 Anemia in chronic kidney disease: Secondary | ICD-10-CM | POA: Diagnosis not present

## 2021-10-08 DIAGNOSIS — N186 End stage renal disease: Secondary | ICD-10-CM | POA: Diagnosis not present

## 2021-10-08 DIAGNOSIS — Z992 Dependence on renal dialysis: Secondary | ICD-10-CM | POA: Diagnosis not present

## 2021-10-08 DIAGNOSIS — E876 Hypokalemia: Secondary | ICD-10-CM | POA: Diagnosis not present

## 2021-10-08 DIAGNOSIS — N2581 Secondary hyperparathyroidism of renal origin: Secondary | ICD-10-CM | POA: Diagnosis not present

## 2021-10-10 DIAGNOSIS — E876 Hypokalemia: Secondary | ICD-10-CM | POA: Diagnosis not present

## 2021-10-10 DIAGNOSIS — N186 End stage renal disease: Secondary | ICD-10-CM | POA: Diagnosis not present

## 2021-10-10 DIAGNOSIS — D631 Anemia in chronic kidney disease: Secondary | ICD-10-CM | POA: Diagnosis not present

## 2021-10-10 DIAGNOSIS — N2581 Secondary hyperparathyroidism of renal origin: Secondary | ICD-10-CM | POA: Diagnosis not present

## 2021-10-10 DIAGNOSIS — Z992 Dependence on renal dialysis: Secondary | ICD-10-CM | POA: Diagnosis not present

## 2021-10-13 DIAGNOSIS — E876 Hypokalemia: Secondary | ICD-10-CM | POA: Diagnosis not present

## 2021-10-13 DIAGNOSIS — D631 Anemia in chronic kidney disease: Secondary | ICD-10-CM | POA: Diagnosis not present

## 2021-10-13 DIAGNOSIS — N186 End stage renal disease: Secondary | ICD-10-CM | POA: Diagnosis not present

## 2021-10-13 DIAGNOSIS — Z992 Dependence on renal dialysis: Secondary | ICD-10-CM | POA: Diagnosis not present

## 2021-10-13 DIAGNOSIS — N2581 Secondary hyperparathyroidism of renal origin: Secondary | ICD-10-CM | POA: Diagnosis not present

## 2021-10-15 DIAGNOSIS — N186 End stage renal disease: Secondary | ICD-10-CM | POA: Diagnosis not present

## 2021-10-15 DIAGNOSIS — D631 Anemia in chronic kidney disease: Secondary | ICD-10-CM | POA: Diagnosis not present

## 2021-10-15 DIAGNOSIS — E876 Hypokalemia: Secondary | ICD-10-CM | POA: Diagnosis not present

## 2021-10-15 DIAGNOSIS — N2581 Secondary hyperparathyroidism of renal origin: Secondary | ICD-10-CM | POA: Diagnosis not present

## 2021-10-15 DIAGNOSIS — Z992 Dependence on renal dialysis: Secondary | ICD-10-CM | POA: Diagnosis not present

## 2021-10-16 ENCOUNTER — Ambulatory Visit (INDEPENDENT_AMBULATORY_CARE_PROVIDER_SITE_OTHER): Payer: Medicare Other

## 2021-10-17 DIAGNOSIS — Z992 Dependence on renal dialysis: Secondary | ICD-10-CM | POA: Diagnosis not present

## 2021-10-17 DIAGNOSIS — D631 Anemia in chronic kidney disease: Secondary | ICD-10-CM | POA: Diagnosis not present

## 2021-10-17 DIAGNOSIS — N2581 Secondary hyperparathyroidism of renal origin: Secondary | ICD-10-CM | POA: Diagnosis not present

## 2021-10-17 DIAGNOSIS — N186 End stage renal disease: Secondary | ICD-10-CM | POA: Diagnosis not present

## 2021-10-17 DIAGNOSIS — E876 Hypokalemia: Secondary | ICD-10-CM | POA: Diagnosis not present

## 2021-10-20 DIAGNOSIS — N186 End stage renal disease: Secondary | ICD-10-CM | POA: Diagnosis not present

## 2021-10-20 DIAGNOSIS — N2581 Secondary hyperparathyroidism of renal origin: Secondary | ICD-10-CM | POA: Diagnosis not present

## 2021-10-20 DIAGNOSIS — E876 Hypokalemia: Secondary | ICD-10-CM | POA: Diagnosis not present

## 2021-10-20 DIAGNOSIS — Z992 Dependence on renal dialysis: Secondary | ICD-10-CM | POA: Diagnosis not present

## 2021-10-20 DIAGNOSIS — D631 Anemia in chronic kidney disease: Secondary | ICD-10-CM | POA: Diagnosis not present

## 2021-10-21 ENCOUNTER — Ambulatory Visit (INDEPENDENT_AMBULATORY_CARE_PROVIDER_SITE_OTHER): Payer: Medicare Other

## 2021-10-21 DIAGNOSIS — J309 Allergic rhinitis, unspecified: Secondary | ICD-10-CM

## 2021-10-22 DIAGNOSIS — D631 Anemia in chronic kidney disease: Secondary | ICD-10-CM | POA: Diagnosis not present

## 2021-10-22 DIAGNOSIS — E876 Hypokalemia: Secondary | ICD-10-CM | POA: Diagnosis not present

## 2021-10-22 DIAGNOSIS — Z992 Dependence on renal dialysis: Secondary | ICD-10-CM | POA: Diagnosis not present

## 2021-10-22 DIAGNOSIS — N2581 Secondary hyperparathyroidism of renal origin: Secondary | ICD-10-CM | POA: Diagnosis not present

## 2021-10-22 DIAGNOSIS — N186 End stage renal disease: Secondary | ICD-10-CM | POA: Diagnosis not present

## 2021-10-23 ENCOUNTER — Encounter (INDEPENDENT_AMBULATORY_CARE_PROVIDER_SITE_OTHER): Payer: Self-pay

## 2021-10-23 ENCOUNTER — Ambulatory Visit (INDEPENDENT_AMBULATORY_CARE_PROVIDER_SITE_OTHER): Payer: Medicare Other | Admitting: Ophthalmology

## 2021-10-23 DIAGNOSIS — E113511 Type 2 diabetes mellitus with proliferative diabetic retinopathy with macular edema, right eye: Secondary | ICD-10-CM

## 2021-10-23 DIAGNOSIS — H35371 Puckering of macula, right eye: Secondary | ICD-10-CM

## 2021-10-23 DIAGNOSIS — H401121 Primary open-angle glaucoma, left eye, mild stage: Secondary | ICD-10-CM | POA: Diagnosis not present

## 2021-10-23 DIAGNOSIS — E113512 Type 2 diabetes mellitus with proliferative diabetic retinopathy with macular edema, left eye: Secondary | ICD-10-CM | POA: Diagnosis not present

## 2021-10-23 MED ORDER — BEVACIZUMAB 2.5 MG/0.1ML IZ SOSY
2.5000 mg | PREFILLED_SYRINGE | INTRAVITREAL | Status: AC | PRN
  Administered 2021-10-23: 2.5 mg via INTRAVITREAL

## 2021-10-23 MED ORDER — LATANOPROST 0.005 % OP SOLN: 1.0000 [drp] | Freq: Every day | OPHTHALMIC | 1 refills | Status: DC

## 2021-10-23 NOTE — Patient Instructions (Signed)
Patient instructed to commence with latanoprost (Xalatan) 1 drop nightly left eye ?

## 2021-10-23 NOTE — Assessment & Plan Note (Signed)
Persistent and worsening OD, will need vitrectomy membrane peel in the future ?

## 2021-10-23 NOTE — Assessment & Plan Note (Signed)
OD, no recurrence of CSME but chronic thickening on the basis of ILM contracture which developed post vitrectomy.  Will need vitrectomy membrane peel so that patient can regain even more acuity ?

## 2021-10-23 NOTE — Progress Notes (Signed)
? ? ?10/23/2021 ? ?  ? ?CHIEF COMPLAINT ?Patient presents for  ?Chief Complaint  ?Patient presents with  ? Diabetic Retinopathy with Macular Edema  ? ? ? ? ?HISTORY OF PRESENT ILLNESS: ?Destiny Day is a 71 y.o. female who presents to the clinic today for:  ? ?HPI   ?4 weeks for iop check with tech only (Dr. Zadie Rhine in office.) ?Pt stated a little improvement with vision ?Pt denies floaters and FOL. ? ? ? ?Last edited by Silvestre Moment on 10/23/2021  3:52 PM.  ?  ? ? ?Referring physician: ?Warden Fillers, MD ?[address] ?[address] ?[place],  [place] [address] ? ?HISTORICAL INFORMATION:  ? ?Selected notes from the [facility] ?  ? ?Lab Results  ?Component Value Date  ? HGBA1C 6.4 (H) 08/04/2021  ?  ? ?CURRENT MEDICATIONS: ?No current outpatient medications on file. (Ophthalmic Drugs)  ? ?No current facility-administered medications for this visit. (Ophthalmic Drugs)  ? ?Current Outpatient Medications (Other)  ?Medication Sig  ? albuterol (PROAIR HFA) 108 (90 Base) MCG/ACT inhaler Inhale 2 puffs into the lungs every 4 (four) hours as needed for wheezing or shortness of breath.  ? amLODipine (NORVASC) 10 MG tablet Take 10 mg by mouth at bedtime.  ? aspirin EC 81 MG tablet Take 81 mg by mouth daily.  ? atorvastatin (LIPITOR) 80 MG tablet Take 80 mg by mouth at bedtime.  ? busPIRone (BUSPAR) 7.5 MG tablet Take 7.5 mg by mouth 2 (two) times daily as needed (anxiety).  ? carvedilol (COREG) 12.5 MG tablet Take 12.5 mg by mouth See admin instructions. Take 1 tablet (12.5 mg) by mouth once daily in the evening on Mondays, Wednesdays, & Fridays. (Dialysis) ?Take 1 tablet (12.5 mg) by mouth twice daily on Sundays, Tuesdays, Thursdays, & Saturdays. (Non Dialysis)  ? cinacalcet (SENSIPAR) 90 MG tablet Take 90 mg by mouth See admin instructions. Take 1 tablet (90 mg) by mouth in the evening on Mondays, Wednesdays, & Fridays (dialysis) ?Take 1 tablet (90 mg) by mouth twice daily on Sundays, Tuesdays, Thursdays, Saturdays. (non  dialysis)  ? EPINEPHrine (AUVI-Q) 0.3 mg/0.3 mL IJ SOAJ injection Inject 0.3 mg into the muscle as needed for anaphylaxis.  ? ferric citrate (AURYXIA) 1 GM 210 MG(Fe) tablet Take 210-420 mg by mouth See admin instructions. Take 2 tablets (420 mg) by mouth with each meal & take 1 tablet (210 mg) by mouth with snacks.  ? fluticasone (FLONASE) 50 MCG/ACT nasal spray Place 2 sprays into both nostrils daily as needed for allergies or rhinitis.  ? fluticasone-salmeterol (ADVAIR HFA) 115-21 MCG/ACT inhaler Inhale two puffs twice daily (Patient not taking: Reported on 08/03/2021)  ? gabapentin (NEURONTIN) 100 MG capsule TAKE 1 CAPSULE (100 MG TOTAL) BY MOUTH AT BEDTIME. WHEN NECESSARY FOR NEUROPATHY PAIN (Patient taking differently: Take 100 mg by mouth at bedtime as needed (pain). When necessary for neuropathy pain)  ? glipiZIDE (GLUCOTROL) 5 MG tablet Take 5 mg by mouth daily before breakfast.  ? guaiFENesin-codeine 100-10 MG/5ML syrup Take 5 mLs by mouth 3 (three) times daily as needed for cough.  ? isosorbide-hydrALAZINE (BIDIL) 20-37.5 MG tablet Take 1 tablet by mouth 3 (three) times daily. BID on days of dialysis (Patient taking differently: Take 1 tablet by mouth See admin instructions. hs on Monday,Wednesday,Friday (dialysis days) ?Tid on Tuesday,Thursday,Saturday,Sunday(non-dialysis days))  ? lidocaine-prilocaine (EMLA) cream Apply 1 application topically 3 (three) times a week. 1-2 hours prior to dialysis  ? loperamide (IMODIUM) 2 MG capsule Take 2  capsules (4 mg total) by mouth 3 (three) times daily as needed for diarrhea or loose stools.  ? montelukast (SINGULAIR) 10 MG tablet TAKE 1 TABLET BY MOUTH EVERYDAY AT BEDTIME  ? omeprazole (PRILOSEC) 20 MG capsule Take 20 mg by mouth daily.   ? ondansetron (ZOFRAN) 4 MG tablet Take 1 tablet (4 mg total) by mouth every 6 (six) hours as needed for nausea.  ? polyethylene glycol (MIRALAX / GLYCOLAX) 17 g packet Take 17 g by mouth daily. (Patient taking differently: Take 17  g by mouth daily as needed for mild constipation.)  ? Spacer/Aero-Holding Chambers (AEROCHAMBER PLUS) inhaler Use with inhaler  ? traZODone (DESYREL) 50 MG tablet Take 50 mg by mouth at bedtime as needed for sleep.  ? Vitamin D, Ergocalciferol, (DRISDOL) 1.25 MG (50000 UT) CAPS capsule Take 50,000 Units by mouth every Sunday. Takes on Sunday (Patient not taking: Reported on 08/03/2021)  ? ?No current facility-administered medications for this visit. (Other)  ? ? ? ? ?REVIEW OF SYSTEMS: ?ROS   ?Negative for: Constitutional, Gastrointestinal, Neurological, Skin, Genitourinary, Musculoskeletal, HENT, Endocrine, Cardiovascular, Eyes, Respiratory, Psychiatric, Allergic/Imm, Heme/Lymph ?Last edited by Silvestre Moment on 10/23/2021  3:51 PM.  ?  ? ? ? ?ALLERGIES ?Allergies  ?Allergen Reactions  ? Adhesive  [Tape] Hives  ? Lisinopril Cough  ? Prednisone Swelling and Other (See Comments)  ?  Excessive fluid buildup  ? ? ?PAST MEDICAL HISTORY ?Past Medical History:  ?Diagnosis Date  ? Abdominal bruit   ? Anxiety   ? Arthritis   ? Osteoarthritis  ? Asthma   ? Cervical disc disease   ? "pinced nerve"  ? CHF (congestive heart failure) ([facility] [place])   ? Diabetes mellitus   ? Type II  ? Diverticulitis   ? ESRD (end stage renal disease) ([place])   ? dialysis - M/W/F- [place]  ? GERD (gastroesophageal reflux disease)   ? from medications  ? GI bleed 03/31/2013  ? Head injury 07/2017  ? History of hiatal hernia   ? Hyperlipidemia   ? Hypertension   ? Neuropathy   ? left leg  ? Nuclear sclerotic cataract of right eye 04/23/2020  ? Osteoporosis   ? Peripheral vascular disease ([facility])   ? Pneumonia   ? "very young" and a few years ago  ? 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 Vitrectomy membrane peel endolaser 05-07-2021  ? Seasonal allergies   ? Shortness of breath dyspnea   ? WIth exertion, when fluid builds  ? Sleep apnea   ? can't afford cpap   ? 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 05-07-2021  Vitrectomy, membrane peel, PRP, resection posterior segment of NVD, remnants of peripheral NVE remain in place due to atrophic retina, no vitreous substitute  ? ?Past Surgical History:  ?Procedure Laterality Date  ? A/V SHUNTOGRAM N/A 09/22/2016  ? Procedure: A/V Shuntogram - left arm;  Surgeon: Serafina Mitchell, MD;  Location: [facility];  Service: Cardiovascular;  Laterality: N/A;  ? A/V SHUNTOGRAM N/A 03/22/2018  ? Procedure: A/V SHUNTOGRAM - left arm;  Surgeon: Serafina Mitchell, MD;  Location: [facility];  Service: Cardiovascular;  Laterality: N/A;  ? A/V SHUNTOGRAM Left 11/17/2018  ? Procedure: A/V SHUNTOGRAM;  Surgeon: Angelia Mould, MD;  Location: [facility];  Service: Cardiovascular;  Laterality: Left;  ? ABDOMINAL HYSTERECTOMY  1993`  ? AMPUTATION Left 09/01/2016  ? Procedure: LEFT FOOT  TRANSMETATARSAL AMPUTATION;  Surgeon: Newt Minion, MD;  Location: [facility];  Service: Orthopedics;  Laterality: Left;  ? AMPUTATION Right 08/11/2017  ? Procedure: RIGHT GREAT TOE AMPUTATION DIGIT;  Surgeon: Rosetta Posner, MD;  Location: [facility];  Service: Vascular;  Laterality: Right;  ? AMPUTATION Right 12/31/2017  ? Procedure: RIGHT TRANSMETATARSAL AMPUTATION;  Surgeon: Newt Minion, MD;  Location: [facility];  Service: Orthopedics;  Laterality: Right;  ? AV FISTULA PLACEMENT Left 04/21/2016  ? Procedure: INSERTION OF ARTERIOVENOUS (AV) GORE-TEX GRAFT ARM LEFT;  Surgeon: Elam Dutch, MD;  Location: [facility];  Service: Vascular;  Laterality: Left;  ? BASCILIC VEIN TRANSPOSITION Left 07/10/2014  ? Procedure: BASCILIC VEIN TRANSPOSITION;  Surgeon: Angelia Mould, MD;  Location: [facility];  Service: Vascular;  Laterality: Left;  ? [place] Right 11/08/2014  ? Procedure: FIRST STAGE BASILIC VEIN TRANSPOSITION;  Surgeon: Angelia Mould, MD;  Location: [facility];  Service: Vascular;  Laterality: Right;  ? BASCILIC VEIN TRANSPOSITION Right 01/18/2015  ?  Procedure: SECOND STAGE BASILIC VEIN TRANSPOSITION;  Surgeon: Angelia Mould, MD;  Location: [facility];  Service: Vascular;  Laterality: Right;  ? CRANIOTOMY N/A 08/23/2017  ? Procedure: CRANIOTOMY HEMATOMA

## 2021-10-23 NOTE — Assessment & Plan Note (Signed)
Will need to commence with topical therapy with a drop eyedrop 1 drop at bedtime left eye ?

## 2021-10-24 DIAGNOSIS — E876 Hypokalemia: Secondary | ICD-10-CM | POA: Diagnosis not present

## 2021-10-24 DIAGNOSIS — Z992 Dependence on renal dialysis: Secondary | ICD-10-CM | POA: Diagnosis not present

## 2021-10-24 DIAGNOSIS — D631 Anemia in chronic kidney disease: Secondary | ICD-10-CM | POA: Diagnosis not present

## 2021-10-24 DIAGNOSIS — N186 End stage renal disease: Secondary | ICD-10-CM | POA: Diagnosis not present

## 2021-10-24 DIAGNOSIS — N2581 Secondary hyperparathyroidism of renal origin: Secondary | ICD-10-CM | POA: Diagnosis not present

## 2021-10-25 DIAGNOSIS — N186 End stage renal disease: Secondary | ICD-10-CM | POA: Diagnosis not present

## 2021-10-25 DIAGNOSIS — I159 Secondary hypertension, unspecified: Secondary | ICD-10-CM | POA: Diagnosis not present

## 2021-10-25 DIAGNOSIS — Z992 Dependence on renal dialysis: Secondary | ICD-10-CM | POA: Diagnosis not present

## 2021-10-27 DIAGNOSIS — E876 Hypokalemia: Secondary | ICD-10-CM | POA: Diagnosis not present

## 2021-10-27 DIAGNOSIS — Z992 Dependence on renal dialysis: Secondary | ICD-10-CM | POA: Diagnosis not present

## 2021-10-27 DIAGNOSIS — N2581 Secondary hyperparathyroidism of renal origin: Secondary | ICD-10-CM | POA: Diagnosis not present

## 2021-10-27 DIAGNOSIS — N186 End stage renal disease: Secondary | ICD-10-CM | POA: Diagnosis not present

## 2021-10-27 DIAGNOSIS — D509 Iron deficiency anemia, unspecified: Secondary | ICD-10-CM | POA: Diagnosis not present

## 2021-10-27 DIAGNOSIS — D631 Anemia in chronic kidney disease: Secondary | ICD-10-CM | POA: Diagnosis not present

## 2021-10-29 DIAGNOSIS — D509 Iron deficiency anemia, unspecified: Secondary | ICD-10-CM | POA: Diagnosis not present

## 2021-10-29 DIAGNOSIS — Z992 Dependence on renal dialysis: Secondary | ICD-10-CM | POA: Diagnosis not present

## 2021-10-29 DIAGNOSIS — N2581 Secondary hyperparathyroidism of renal origin: Secondary | ICD-10-CM | POA: Diagnosis not present

## 2021-10-29 DIAGNOSIS — D631 Anemia in chronic kidney disease: Secondary | ICD-10-CM | POA: Diagnosis not present

## 2021-10-29 DIAGNOSIS — E876 Hypokalemia: Secondary | ICD-10-CM | POA: Diagnosis not present

## 2021-10-29 DIAGNOSIS — N186 End stage renal disease: Secondary | ICD-10-CM | POA: Diagnosis not present

## 2021-10-31 DIAGNOSIS — D509 Iron deficiency anemia, unspecified: Secondary | ICD-10-CM | POA: Diagnosis not present

## 2021-10-31 DIAGNOSIS — N186 End stage renal disease: Secondary | ICD-10-CM | POA: Diagnosis not present

## 2021-10-31 DIAGNOSIS — Z992 Dependence on renal dialysis: Secondary | ICD-10-CM | POA: Diagnosis not present

## 2021-10-31 DIAGNOSIS — D631 Anemia in chronic kidney disease: Secondary | ICD-10-CM | POA: Diagnosis not present

## 2021-10-31 DIAGNOSIS — E876 Hypokalemia: Secondary | ICD-10-CM | POA: Diagnosis not present

## 2021-10-31 DIAGNOSIS — N2581 Secondary hyperparathyroidism of renal origin: Secondary | ICD-10-CM | POA: Diagnosis not present

## 2021-11-03 DIAGNOSIS — D509 Iron deficiency anemia, unspecified: Secondary | ICD-10-CM | POA: Diagnosis not present

## 2021-11-03 DIAGNOSIS — N2581 Secondary hyperparathyroidism of renal origin: Secondary | ICD-10-CM | POA: Diagnosis not present

## 2021-11-03 DIAGNOSIS — D631 Anemia in chronic kidney disease: Secondary | ICD-10-CM | POA: Diagnosis not present

## 2021-11-03 DIAGNOSIS — Z992 Dependence on renal dialysis: Secondary | ICD-10-CM | POA: Diagnosis not present

## 2021-11-03 DIAGNOSIS — E1122 Type 2 diabetes mellitus with diabetic chronic kidney disease: Secondary | ICD-10-CM | POA: Diagnosis not present

## 2021-11-03 DIAGNOSIS — N186 End stage renal disease: Secondary | ICD-10-CM | POA: Diagnosis not present

## 2021-11-03 DIAGNOSIS — E876 Hypokalemia: Secondary | ICD-10-CM | POA: Diagnosis not present

## 2021-11-04 ENCOUNTER — Ambulatory Visit (INDEPENDENT_AMBULATORY_CARE_PROVIDER_SITE_OTHER): Payer: Medicare Other

## 2021-11-04 DIAGNOSIS — J309 Allergic rhinitis, unspecified: Secondary | ICD-10-CM | POA: Diagnosis not present

## 2021-11-05 DIAGNOSIS — Z992 Dependence on renal dialysis: Secondary | ICD-10-CM | POA: Diagnosis not present

## 2021-11-05 DIAGNOSIS — E876 Hypokalemia: Secondary | ICD-10-CM | POA: Diagnosis not present

## 2021-11-05 DIAGNOSIS — D509 Iron deficiency anemia, unspecified: Secondary | ICD-10-CM | POA: Diagnosis not present

## 2021-11-05 DIAGNOSIS — D631 Anemia in chronic kidney disease: Secondary | ICD-10-CM | POA: Diagnosis not present

## 2021-11-05 DIAGNOSIS — N186 End stage renal disease: Secondary | ICD-10-CM | POA: Diagnosis not present

## 2021-11-05 DIAGNOSIS — N2581 Secondary hyperparathyroidism of renal origin: Secondary | ICD-10-CM | POA: Diagnosis not present

## 2021-11-07 DIAGNOSIS — Z992 Dependence on renal dialysis: Secondary | ICD-10-CM | POA: Diagnosis not present

## 2021-11-07 DIAGNOSIS — E876 Hypokalemia: Secondary | ICD-10-CM | POA: Diagnosis not present

## 2021-11-07 DIAGNOSIS — D631 Anemia in chronic kidney disease: Secondary | ICD-10-CM | POA: Diagnosis not present

## 2021-11-07 DIAGNOSIS — N186 End stage renal disease: Secondary | ICD-10-CM | POA: Diagnosis not present

## 2021-11-07 DIAGNOSIS — D509 Iron deficiency anemia, unspecified: Secondary | ICD-10-CM | POA: Diagnosis not present

## 2021-11-07 DIAGNOSIS — N2581 Secondary hyperparathyroidism of renal origin: Secondary | ICD-10-CM | POA: Diagnosis not present

## 2021-11-10 DIAGNOSIS — D631 Anemia in chronic kidney disease: Secondary | ICD-10-CM | POA: Diagnosis not present

## 2021-11-10 DIAGNOSIS — N186 End stage renal disease: Secondary | ICD-10-CM | POA: Diagnosis not present

## 2021-11-10 DIAGNOSIS — Z992 Dependence on renal dialysis: Secondary | ICD-10-CM | POA: Diagnosis not present

## 2021-11-10 DIAGNOSIS — N2581 Secondary hyperparathyroidism of renal origin: Secondary | ICD-10-CM | POA: Diagnosis not present

## 2021-11-10 DIAGNOSIS — E876 Hypokalemia: Secondary | ICD-10-CM | POA: Diagnosis not present

## 2021-11-10 DIAGNOSIS — D509 Iron deficiency anemia, unspecified: Secondary | ICD-10-CM | POA: Diagnosis not present

## 2021-11-12 DIAGNOSIS — D631 Anemia in chronic kidney disease: Secondary | ICD-10-CM | POA: Diagnosis not present

## 2021-11-12 DIAGNOSIS — Z992 Dependence on renal dialysis: Secondary | ICD-10-CM | POA: Diagnosis not present

## 2021-11-12 DIAGNOSIS — E876 Hypokalemia: Secondary | ICD-10-CM | POA: Diagnosis not present

## 2021-11-12 DIAGNOSIS — N186 End stage renal disease: Secondary | ICD-10-CM | POA: Diagnosis not present

## 2021-11-12 DIAGNOSIS — N2581 Secondary hyperparathyroidism of renal origin: Secondary | ICD-10-CM | POA: Diagnosis not present

## 2021-11-12 DIAGNOSIS — D509 Iron deficiency anemia, unspecified: Secondary | ICD-10-CM | POA: Diagnosis not present

## 2021-11-14 DIAGNOSIS — N2581 Secondary hyperparathyroidism of renal origin: Secondary | ICD-10-CM | POA: Diagnosis not present

## 2021-11-14 DIAGNOSIS — E876 Hypokalemia: Secondary | ICD-10-CM | POA: Diagnosis not present

## 2021-11-14 DIAGNOSIS — D631 Anemia in chronic kidney disease: Secondary | ICD-10-CM | POA: Diagnosis not present

## 2021-11-14 DIAGNOSIS — Z992 Dependence on renal dialysis: Secondary | ICD-10-CM | POA: Diagnosis not present

## 2021-11-14 DIAGNOSIS — N186 End stage renal disease: Secondary | ICD-10-CM | POA: Diagnosis not present

## 2021-11-14 DIAGNOSIS — D509 Iron deficiency anemia, unspecified: Secondary | ICD-10-CM | POA: Diagnosis not present

## 2021-11-17 DIAGNOSIS — Z992 Dependence on renal dialysis: Secondary | ICD-10-CM | POA: Diagnosis not present

## 2021-11-17 DIAGNOSIS — N186 End stage renal disease: Secondary | ICD-10-CM | POA: Diagnosis not present

## 2021-11-17 DIAGNOSIS — D509 Iron deficiency anemia, unspecified: Secondary | ICD-10-CM | POA: Diagnosis not present

## 2021-11-17 DIAGNOSIS — D631 Anemia in chronic kidney disease: Secondary | ICD-10-CM | POA: Diagnosis not present

## 2021-11-17 DIAGNOSIS — E876 Hypokalemia: Secondary | ICD-10-CM | POA: Diagnosis not present

## 2021-11-17 DIAGNOSIS — N2581 Secondary hyperparathyroidism of renal origin: Secondary | ICD-10-CM | POA: Diagnosis not present

## 2021-11-18 ENCOUNTER — Ambulatory Visit (INDEPENDENT_AMBULATORY_CARE_PROVIDER_SITE_OTHER): Payer: Medicare Other

## 2021-11-18 DIAGNOSIS — J309 Allergic rhinitis, unspecified: Secondary | ICD-10-CM

## 2021-11-19 DIAGNOSIS — N2581 Secondary hyperparathyroidism of renal origin: Secondary | ICD-10-CM | POA: Diagnosis not present

## 2021-11-19 DIAGNOSIS — D509 Iron deficiency anemia, unspecified: Secondary | ICD-10-CM | POA: Diagnosis not present

## 2021-11-19 DIAGNOSIS — N186 End stage renal disease: Secondary | ICD-10-CM | POA: Diagnosis not present

## 2021-11-19 DIAGNOSIS — E876 Hypokalemia: Secondary | ICD-10-CM | POA: Diagnosis not present

## 2021-11-19 DIAGNOSIS — Z992 Dependence on renal dialysis: Secondary | ICD-10-CM | POA: Diagnosis not present

## 2021-11-19 DIAGNOSIS — D631 Anemia in chronic kidney disease: Secondary | ICD-10-CM | POA: Diagnosis not present

## 2021-11-21 DIAGNOSIS — E876 Hypokalemia: Secondary | ICD-10-CM | POA: Diagnosis not present

## 2021-11-21 DIAGNOSIS — N186 End stage renal disease: Secondary | ICD-10-CM | POA: Diagnosis not present

## 2021-11-21 DIAGNOSIS — Z992 Dependence on renal dialysis: Secondary | ICD-10-CM | POA: Diagnosis not present

## 2021-11-21 DIAGNOSIS — N2581 Secondary hyperparathyroidism of renal origin: Secondary | ICD-10-CM | POA: Diagnosis not present

## 2021-11-21 DIAGNOSIS — D509 Iron deficiency anemia, unspecified: Secondary | ICD-10-CM | POA: Diagnosis not present

## 2021-11-21 DIAGNOSIS — D631 Anemia in chronic kidney disease: Secondary | ICD-10-CM | POA: Diagnosis not present

## 2021-11-24 DIAGNOSIS — E876 Hypokalemia: Secondary | ICD-10-CM | POA: Diagnosis not present

## 2021-11-24 DIAGNOSIS — N186 End stage renal disease: Secondary | ICD-10-CM | POA: Diagnosis not present

## 2021-11-24 DIAGNOSIS — J3081 Allergic rhinitis due to animal (cat) (dog) hair and dander: Secondary | ICD-10-CM | POA: Diagnosis not present

## 2021-11-24 DIAGNOSIS — D509 Iron deficiency anemia, unspecified: Secondary | ICD-10-CM | POA: Diagnosis not present

## 2021-11-24 DIAGNOSIS — D631 Anemia in chronic kidney disease: Secondary | ICD-10-CM | POA: Diagnosis not present

## 2021-11-24 DIAGNOSIS — Z992 Dependence on renal dialysis: Secondary | ICD-10-CM | POA: Diagnosis not present

## 2021-11-24 DIAGNOSIS — N2581 Secondary hyperparathyroidism of renal origin: Secondary | ICD-10-CM | POA: Diagnosis not present

## 2021-11-24 DIAGNOSIS — I159 Secondary hypertension, unspecified: Secondary | ICD-10-CM | POA: Diagnosis not present

## 2021-11-24 NOTE — Progress Notes (Signed)
VIALS EXP 11-25-22 ?

## 2021-11-25 ENCOUNTER — Ambulatory Visit (INDEPENDENT_AMBULATORY_CARE_PROVIDER_SITE_OTHER): Payer: Medicare Other | Admitting: Ophthalmology

## 2021-11-25 ENCOUNTER — Encounter (INDEPENDENT_AMBULATORY_CARE_PROVIDER_SITE_OTHER): Payer: Self-pay | Admitting: Ophthalmology

## 2021-11-25 DIAGNOSIS — E113512 Type 2 diabetes mellitus with proliferative diabetic retinopathy with macular edema, left eye: Secondary | ICD-10-CM | POA: Diagnosis not present

## 2021-11-25 DIAGNOSIS — E113511 Type 2 diabetes mellitus with proliferative diabetic retinopathy with macular edema, right eye: Secondary | ICD-10-CM | POA: Diagnosis not present

## 2021-11-25 DIAGNOSIS — J3089 Other allergic rhinitis: Secondary | ICD-10-CM | POA: Diagnosis not present

## 2021-11-25 DIAGNOSIS — H35371 Puckering of macula, right eye: Secondary | ICD-10-CM | POA: Diagnosis not present

## 2021-11-25 MED ORDER — LATANOPROST 0.005 % OP SOLN: 1.0000 [drp] | Freq: Every day | OPHTHALMIC | 5 refills | Status: DC

## 2021-11-25 MED ORDER — BEVACIZUMAB 2.5 MG/0.1ML IZ SOSY
2.5000 mg | PREFILLED_SYRINGE | INTRAVITREAL | Status: AC | PRN
  Administered 2021-11-25: 2.5 mg via INTRAVITREAL

## 2021-11-25 NOTE — Progress Notes (Signed)
? ? ?11/25/2021 ? ?  ? ?CHIEF COMPLAINT ?Patient presents for  ?Chief Complaint  ?Patient presents with  ? Diabetic Retinopathy with Macular Edema  ? ? ? ? ?HISTORY OF PRESENT ILLNESS: ?Destiny Day is a 71 y.o. female who presents to the clinic today for:  ? ?HPI   ?5 weeks for DILATE OU, AVASTIN OCT, OS. ?Pt stated vision has improved. ?Pt denies floaters and FOL. ? ?Last edited by Silvestre Moment on 11/25/2021  3:07 PM.  ?  ? ? ?Referring physician: ?Cipriano Mile, NP ?[address] ?[place],  [place] [address] ? ?HISTORICAL INFORMATION:  ? ?Selected notes from the [facility] ?  ? ?Lab Results  ?Component Value Date  ? HGBA1C 6.4 (H) 08/04/2021  ?  ? ?CURRENT MEDICATIONS: ?Current Outpatient Medications (Ophthalmic Drugs)  ?Medication Sig  ? latanoprost (XALATAN) 0.005 % ophthalmic solution Place 1 drop into the left eye at bedtime.  ? ?No current facility-administered medications for this visit. (Ophthalmic Drugs)  ? ?Current Outpatient Medications (Other)  ?Medication Sig  ? albuterol (PROAIR HFA) 108 (90 Base) MCG/ACT inhaler Inhale 2 puffs into the lungs every 4 (four) hours as needed for wheezing or shortness of breath.  ? amLODipine (NORVASC) 10 MG tablet Take 10 mg by mouth at bedtime.  ? aspirin EC 81 MG tablet Take 81 mg by mouth daily.  ? atorvastatin (LIPITOR) 80 MG tablet Take 80 mg by mouth at bedtime.  ? busPIRone (BUSPAR) 7.5 MG tablet Take 7.5 mg by mouth 2 (two) times daily as needed (anxiety).  ? carvedilol (COREG) 12.5 MG tablet Take 12.5 mg by mouth See admin instructions. Take 1 tablet (12.5 mg) by mouth once daily in the evening on Mondays, Wednesdays, & Fridays. (Dialysis) ?Take 1 tablet (12.5 mg) by mouth twice daily on Sundays, Tuesdays, Thursdays, & Saturdays. (Non Dialysis)  ? cinacalcet (SENSIPAR) 90 MG tablet Take 90 mg by mouth See admin instructions. Take 1 tablet (90 mg) by mouth in the evening on Mondays, Wednesdays, & Fridays (dialysis) ?Take 1 tablet (90 mg) by mouth twice daily  on Sundays, Tuesdays, Thursdays, Saturdays. (non dialysis)  ? EPINEPHrine (AUVI-Q) 0.3 mg/0.3 mL IJ SOAJ injection Inject 0.3 mg into the muscle as needed for anaphylaxis.  ? ferric citrate (AURYXIA) 1 GM 210 MG(Fe) tablet Take 210-420 mg by mouth See admin instructions. Take 2 tablets (420 mg) by mouth with each meal & take 1 tablet (210 mg) by mouth with snacks.  ? fluticasone (FLONASE) 50 MCG/ACT nasal spray Place 2 sprays into both nostrils daily as needed for allergies or rhinitis.  ? fluticasone-salmeterol (ADVAIR HFA) 115-21 MCG/ACT inhaler Inhale two puffs twice daily (Patient not taking: Reported on 08/03/2021)  ? gabapentin (NEURONTIN) 100 MG capsule TAKE 1 CAPSULE (100 MG TOTAL) BY MOUTH AT BEDTIME. WHEN NECESSARY FOR NEUROPATHY PAIN (Patient taking differently: Take 100 mg by mouth at bedtime as needed (pain). When necessary for neuropathy pain)  ? glipiZIDE (GLUCOTROL) 5 MG tablet Take 5 mg by mouth daily before breakfast.  ? guaiFENesin-codeine 100-10 MG/5ML syrup Take 5 mLs by mouth 3 (three) times daily as needed for cough.  ? isosorbide-hydrALAZINE (BIDIL) 20-37.5 MG tablet Take 1 tablet by mouth 3 (three) times daily. BID on days of dialysis (Patient taking differently: Take 1 tablet by mouth See admin instructions. hs on Monday,Wednesday,Friday (dialysis days) ?Tid on Tuesday,Thursday,Saturday,Sunday(non-dialysis days))  ? lidocaine-prilocaine (EMLA) cream Apply 1 application topically 3 (three) times a week. 1-2 hours prior to dialysis  ? loperamide (  IMODIUM) 2 MG capsule Take 2 capsules (4 mg total) by mouth 3 (three) times daily as needed for diarrhea or loose stools.  ? montelukast (SINGULAIR) 10 MG tablet TAKE 1 TABLET BY MOUTH EVERYDAY AT BEDTIME  ? omeprazole (PRILOSEC) 20 MG capsule Take 20 mg by mouth daily.   ? ondansetron (ZOFRAN) 4 MG tablet Take 1 tablet (4 mg total) by mouth every 6 (six) hours as needed for nausea.  ? polyethylene glycol (MIRALAX / GLYCOLAX) 17 g packet Take 17 g by  mouth daily. (Patient taking differently: Take 17 g by mouth daily as needed for mild constipation.)  ? Spacer/Aero-Holding Chambers (AEROCHAMBER PLUS) inhaler Use with inhaler  ? traZODone (DESYREL) 50 MG tablet Take 50 mg by mouth at bedtime as needed for sleep.  ? Vitamin D, Ergocalciferol, (DRISDOL) 1.25 MG (50000 UT) CAPS capsule Take 50,000 Units by mouth every Sunday. Takes on Sunday (Patient not taking: Reported on 08/03/2021)  ? ?No current facility-administered medications for this visit. (Other)  ? ? ? ? ?REVIEW OF SYSTEMS: ?ROS   ?Negative for: Constitutional, Gastrointestinal, Neurological, Skin, Genitourinary, Musculoskeletal, HENT, Endocrine, Cardiovascular, Eyes, Respiratory, Psychiatric, Allergic/Imm, Heme/Lymph ?Last edited by Silvestre Moment on 11/25/2021  3:07 PM.  ?  ? ? ? ?ALLERGIES ?Allergies  ?Allergen Reactions  ? Adhesive  [Tape] Hives  ? Lisinopril Cough  ? Prednisone Swelling and Other (See Comments)  ?  Excessive fluid buildup  ? ? ?PAST MEDICAL HISTORY ?Past Medical History:  ?Diagnosis Date  ? Abdominal bruit   ? Anxiety   ? Arthritis   ? Osteoarthritis  ? Asthma   ? Cervical disc disease   ? "pinced nerve"  ? CHF (congestive heart failure) ([facility])   ? Diabetes mellitus   ? Type II  ? Diverticulitis   ? ESRD (end stage renal disease) ([facility] [facility])   ? dialysis - M/W/F- [place]  ? GERD (gastroesophageal reflux disease)   ? from medications  ? GI bleed 03/31/2013  ? Head injury 07/2017  ? History of hiatal hernia   ? Hyperlipidemia   ? Hypertension   ? Neuropathy   ? left leg  ? Nuclear sclerotic cataract of right eye 04/23/2020  ? Osteoporosis   ? Peripheral vascular disease ([facility] [place])   ? Pneumonia   ? "very young" and a few years ago  ? 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 Vitrectomy membrane peel endolaser 05-07-2021  ? Seasonal allergies   ? Shortness of breath dyspnea   ? WIth exertion, when fluid builds  ? Sleep apnea   ? can't  afford cpap   ? Vitreomacular traction syndrome, 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  ? 05-07-2021  Vitrectomy, membrane peel, PRP, resection posterior segment of NVD, remnants of peripheral NVE remain in place due to atrophic retina, no vitreous substitute  ? ?Past Surgical History:  ?Procedure Laterality Date  ? A/V SHUNTOGRAM N/A 09/22/2016  ? Procedure: A/V Shuntogram - left arm;  Surgeon: Serafina Mitchell, MD;  Location: [facility];  Service: Cardiovascular;  Laterality: N/A;  ? A/V SHUNTOGRAM N/A 03/22/2018  ? Procedure: A/V SHUNTOGRAM - left arm;  Surgeon: Serafina Mitchell, MD;  Location: [facility];  Service: Cardiovascular;  Laterality: N/A;  ? A/V SHUNTOGRAM Left 11/17/2018  ? Procedure: A/V SHUNTOGRAM;  Surgeon: Angelia Mould, MD;  Location: [facility];  Service: Cardiovascular;  Laterality: Left;  ? ABDOMINAL HYSTERECTOMY  1993`  ? AMPUTATION Left  09/01/2016  ? Procedure: LEFT FOOT TRANSMETATARSAL AMPUTATION;  Surgeon: Newt Minion, MD;  Location: [facility];  Service: Orthopedics;  Laterality: Left;  ? AMPUTATION Right 08/11/2017  ? Procedure: RIGHT GREAT TOE AMPUTATION DIGIT;  Surgeon: Rosetta Posner, MD;  Location[facility];  Service: Vascular;  Laterality: Right;  ? AMPUTATION Right 12/31/2017  ? Procedure: RIGHT TRANSMETATARSAL AMPUTATION;  Surgeon: Newt Minion, MD;  Location: [facility];  Service: Orthopedics;  Laterality: Right;  ? AV FISTULA PLACEMENT Left 04/21/2016  ? Procedure: INSERTION OF ARTERIOVENOUS (AV) GORE-TEX GRAFT ARM LEFT;  Surgeon: Elam Dutch, MD;  Location: [facility];  Service: Vascular;  Laterality: Left;  ? BASCILIC VEIN TRANSPOSITION Left 07/10/2014  ? Procedure: BASCILIC VEIN TRANSPOSITION;  Surgeon: Angelia Mould, MD;  Location: [facility];  Service: Vascular;  Laterality: Left;  ? [place] Right 11/08/2014  ? Procedure: FIRST STAGE BASILIC VEIN TRANSPOSITION;  Surgeon: Angelia Mould, MD;  Location: [facility];  Service: Vascular;  Laterality: Right;  ?  BASCILIC VEIN TRANSPOSITION Right 01/18/2015  ? Procedure: SECOND STAGE BASILIC VEIN TRANSPOSITION;  Surgeon: Angelia Mould, MD;  Location: [facility];  Service: Vascular;  Laterality: Right;  ? CRANIOTOM

## 2021-11-25 NOTE — Addendum Note (Signed)
Addended by: Deloria Lair A on: 11/25/2021 04:18 PM ? ? Modules accepted: Orders ? ?

## 2021-11-25 NOTE — Assessment & Plan Note (Signed)
Severe epiretinal membrane, preretinal fibrosis accounts for acuity OD.  Is a regrowth from prior epiretinal tissue, will need to have surgery to allow for best acuity in the right eye to be regained ?

## 2021-11-25 NOTE — Assessment & Plan Note (Signed)
Chronic active CSME center involved.  Repeat injection today Avastin and interval follow-up currently at 5 weeks ?

## 2021-11-25 NOTE — Assessment & Plan Note (Signed)
PDR OD is quiescent. ?

## 2021-11-26 DIAGNOSIS — N2581 Secondary hyperparathyroidism of renal origin: Secondary | ICD-10-CM | POA: Diagnosis not present

## 2021-11-26 DIAGNOSIS — N186 End stage renal disease: Secondary | ICD-10-CM | POA: Diagnosis not present

## 2021-11-26 DIAGNOSIS — D509 Iron deficiency anemia, unspecified: Secondary | ICD-10-CM | POA: Diagnosis not present

## 2021-11-26 DIAGNOSIS — D631 Anemia in chronic kidney disease: Secondary | ICD-10-CM | POA: Diagnosis not present

## 2021-11-26 DIAGNOSIS — Z992 Dependence on renal dialysis: Secondary | ICD-10-CM | POA: Diagnosis not present

## 2021-11-26 DIAGNOSIS — E876 Hypokalemia: Secondary | ICD-10-CM | POA: Diagnosis not present

## 2021-11-28 DIAGNOSIS — D631 Anemia in chronic kidney disease: Secondary | ICD-10-CM | POA: Diagnosis not present

## 2021-11-28 DIAGNOSIS — E876 Hypokalemia: Secondary | ICD-10-CM | POA: Diagnosis not present

## 2021-11-28 DIAGNOSIS — N2581 Secondary hyperparathyroidism of renal origin: Secondary | ICD-10-CM | POA: Diagnosis not present

## 2021-11-28 DIAGNOSIS — D509 Iron deficiency anemia, unspecified: Secondary | ICD-10-CM | POA: Diagnosis not present

## 2021-11-28 DIAGNOSIS — N186 End stage renal disease: Secondary | ICD-10-CM | POA: Diagnosis not present

## 2021-11-28 DIAGNOSIS — Z992 Dependence on renal dialysis: Secondary | ICD-10-CM | POA: Diagnosis not present

## 2021-12-01 DIAGNOSIS — D631 Anemia in chronic kidney disease: Secondary | ICD-10-CM | POA: Diagnosis not present

## 2021-12-01 DIAGNOSIS — Z992 Dependence on renal dialysis: Secondary | ICD-10-CM | POA: Diagnosis not present

## 2021-12-01 DIAGNOSIS — D509 Iron deficiency anemia, unspecified: Secondary | ICD-10-CM | POA: Diagnosis not present

## 2021-12-01 DIAGNOSIS — N2581 Secondary hyperparathyroidism of renal origin: Secondary | ICD-10-CM | POA: Diagnosis not present

## 2021-12-01 DIAGNOSIS — E876 Hypokalemia: Secondary | ICD-10-CM | POA: Diagnosis not present

## 2021-12-01 DIAGNOSIS — N186 End stage renal disease: Secondary | ICD-10-CM | POA: Diagnosis not present

## 2021-12-02 DIAGNOSIS — N186 End stage renal disease: Secondary | ICD-10-CM | POA: Diagnosis not present

## 2021-12-02 DIAGNOSIS — T82858A Stenosis of vascular prosthetic devices, implants and grafts, initial encounter: Secondary | ICD-10-CM | POA: Diagnosis not present

## 2021-12-02 DIAGNOSIS — Z992 Dependence on renal dialysis: Secondary | ICD-10-CM | POA: Diagnosis not present

## 2021-12-02 DIAGNOSIS — I871 Compression of vein: Secondary | ICD-10-CM | POA: Diagnosis not present

## 2021-12-03 DIAGNOSIS — N2581 Secondary hyperparathyroidism of renal origin: Secondary | ICD-10-CM | POA: Diagnosis not present

## 2021-12-03 DIAGNOSIS — D509 Iron deficiency anemia, unspecified: Secondary | ICD-10-CM | POA: Diagnosis not present

## 2021-12-03 DIAGNOSIS — D631 Anemia in chronic kidney disease: Secondary | ICD-10-CM | POA: Diagnosis not present

## 2021-12-03 DIAGNOSIS — Z992 Dependence on renal dialysis: Secondary | ICD-10-CM | POA: Diagnosis not present

## 2021-12-03 DIAGNOSIS — E876 Hypokalemia: Secondary | ICD-10-CM | POA: Diagnosis not present

## 2021-12-03 DIAGNOSIS — N186 End stage renal disease: Secondary | ICD-10-CM | POA: Diagnosis not present

## 2021-12-04 ENCOUNTER — Ambulatory Visit (INDEPENDENT_AMBULATORY_CARE_PROVIDER_SITE_OTHER): Payer: Medicare Other

## 2021-12-04 DIAGNOSIS — J309 Allergic rhinitis, unspecified: Secondary | ICD-10-CM

## 2021-12-05 DIAGNOSIS — E876 Hypokalemia: Secondary | ICD-10-CM | POA: Diagnosis not present

## 2021-12-05 DIAGNOSIS — D631 Anemia in chronic kidney disease: Secondary | ICD-10-CM | POA: Diagnosis not present

## 2021-12-05 DIAGNOSIS — N186 End stage renal disease: Secondary | ICD-10-CM | POA: Diagnosis not present

## 2021-12-05 DIAGNOSIS — N2581 Secondary hyperparathyroidism of renal origin: Secondary | ICD-10-CM | POA: Diagnosis not present

## 2021-12-05 DIAGNOSIS — Z992 Dependence on renal dialysis: Secondary | ICD-10-CM | POA: Diagnosis not present

## 2021-12-05 DIAGNOSIS — D509 Iron deficiency anemia, unspecified: Secondary | ICD-10-CM | POA: Diagnosis not present

## 2021-12-08 DIAGNOSIS — Z992 Dependence on renal dialysis: Secondary | ICD-10-CM | POA: Diagnosis not present

## 2021-12-08 DIAGNOSIS — N2581 Secondary hyperparathyroidism of renal origin: Secondary | ICD-10-CM | POA: Diagnosis not present

## 2021-12-08 DIAGNOSIS — N186 End stage renal disease: Secondary | ICD-10-CM | POA: Diagnosis not present

## 2021-12-08 DIAGNOSIS — D509 Iron deficiency anemia, unspecified: Secondary | ICD-10-CM | POA: Diagnosis not present

## 2021-12-08 DIAGNOSIS — D631 Anemia in chronic kidney disease: Secondary | ICD-10-CM | POA: Diagnosis not present

## 2021-12-08 DIAGNOSIS — E876 Hypokalemia: Secondary | ICD-10-CM | POA: Diagnosis not present

## 2021-12-10 DIAGNOSIS — E876 Hypokalemia: Secondary | ICD-10-CM | POA: Diagnosis not present

## 2021-12-10 DIAGNOSIS — Z992 Dependence on renal dialysis: Secondary | ICD-10-CM | POA: Diagnosis not present

## 2021-12-10 DIAGNOSIS — N186 End stage renal disease: Secondary | ICD-10-CM | POA: Diagnosis not present

## 2021-12-10 DIAGNOSIS — D631 Anemia in chronic kidney disease: Secondary | ICD-10-CM | POA: Diagnosis not present

## 2021-12-10 DIAGNOSIS — N2581 Secondary hyperparathyroidism of renal origin: Secondary | ICD-10-CM | POA: Diagnosis not present

## 2021-12-10 DIAGNOSIS — D509 Iron deficiency anemia, unspecified: Secondary | ICD-10-CM | POA: Diagnosis not present

## 2021-12-12 DIAGNOSIS — N186 End stage renal disease: Secondary | ICD-10-CM | POA: Diagnosis not present

## 2021-12-12 DIAGNOSIS — Z992 Dependence on renal dialysis: Secondary | ICD-10-CM | POA: Diagnosis not present

## 2021-12-12 DIAGNOSIS — N2581 Secondary hyperparathyroidism of renal origin: Secondary | ICD-10-CM | POA: Diagnosis not present

## 2021-12-12 DIAGNOSIS — D509 Iron deficiency anemia, unspecified: Secondary | ICD-10-CM | POA: Diagnosis not present

## 2021-12-12 DIAGNOSIS — D631 Anemia in chronic kidney disease: Secondary | ICD-10-CM | POA: Diagnosis not present

## 2021-12-12 DIAGNOSIS — E876 Hypokalemia: Secondary | ICD-10-CM | POA: Diagnosis not present

## 2021-12-15 DIAGNOSIS — E876 Hypokalemia: Secondary | ICD-10-CM | POA: Diagnosis not present

## 2021-12-15 DIAGNOSIS — N2581 Secondary hyperparathyroidism of renal origin: Secondary | ICD-10-CM | POA: Diagnosis not present

## 2021-12-15 DIAGNOSIS — D509 Iron deficiency anemia, unspecified: Secondary | ICD-10-CM | POA: Diagnosis not present

## 2021-12-15 DIAGNOSIS — Z992 Dependence on renal dialysis: Secondary | ICD-10-CM | POA: Diagnosis not present

## 2021-12-15 DIAGNOSIS — N186 End stage renal disease: Secondary | ICD-10-CM | POA: Diagnosis not present

## 2021-12-15 DIAGNOSIS — D631 Anemia in chronic kidney disease: Secondary | ICD-10-CM | POA: Diagnosis not present

## 2021-12-16 ENCOUNTER — Ambulatory Visit (INDEPENDENT_AMBULATORY_CARE_PROVIDER_SITE_OTHER): Payer: Medicare Other

## 2021-12-16 DIAGNOSIS — J309 Allergic rhinitis, unspecified: Secondary | ICD-10-CM

## 2021-12-17 DIAGNOSIS — N2581 Secondary hyperparathyroidism of renal origin: Secondary | ICD-10-CM | POA: Diagnosis not present

## 2021-12-17 DIAGNOSIS — Z992 Dependence on renal dialysis: Secondary | ICD-10-CM | POA: Diagnosis not present

## 2021-12-17 DIAGNOSIS — D509 Iron deficiency anemia, unspecified: Secondary | ICD-10-CM | POA: Diagnosis not present

## 2021-12-17 DIAGNOSIS — E876 Hypokalemia: Secondary | ICD-10-CM | POA: Diagnosis not present

## 2021-12-17 DIAGNOSIS — N186 End stage renal disease: Secondary | ICD-10-CM | POA: Diagnosis not present

## 2021-12-17 DIAGNOSIS — D631 Anemia in chronic kidney disease: Secondary | ICD-10-CM | POA: Diagnosis not present

## 2021-12-19 DIAGNOSIS — Z992 Dependence on renal dialysis: Secondary | ICD-10-CM | POA: Diagnosis not present

## 2021-12-19 DIAGNOSIS — D631 Anemia in chronic kidney disease: Secondary | ICD-10-CM | POA: Diagnosis not present

## 2021-12-19 DIAGNOSIS — N2581 Secondary hyperparathyroidism of renal origin: Secondary | ICD-10-CM | POA: Diagnosis not present

## 2021-12-19 DIAGNOSIS — E876 Hypokalemia: Secondary | ICD-10-CM | POA: Diagnosis not present

## 2021-12-19 DIAGNOSIS — N186 End stage renal disease: Secondary | ICD-10-CM | POA: Diagnosis not present

## 2021-12-19 DIAGNOSIS — D509 Iron deficiency anemia, unspecified: Secondary | ICD-10-CM | POA: Diagnosis not present

## 2021-12-22 DIAGNOSIS — Z992 Dependence on renal dialysis: Secondary | ICD-10-CM | POA: Diagnosis not present

## 2021-12-22 DIAGNOSIS — N2581 Secondary hyperparathyroidism of renal origin: Secondary | ICD-10-CM | POA: Diagnosis not present

## 2021-12-22 DIAGNOSIS — E876 Hypokalemia: Secondary | ICD-10-CM | POA: Diagnosis not present

## 2021-12-22 DIAGNOSIS — D509 Iron deficiency anemia, unspecified: Secondary | ICD-10-CM | POA: Diagnosis not present

## 2021-12-22 DIAGNOSIS — D631 Anemia in chronic kidney disease: Secondary | ICD-10-CM | POA: Diagnosis not present

## 2021-12-22 DIAGNOSIS — N186 End stage renal disease: Secondary | ICD-10-CM | POA: Diagnosis not present

## 2021-12-24 DIAGNOSIS — D631 Anemia in chronic kidney disease: Secondary | ICD-10-CM | POA: Diagnosis not present

## 2021-12-24 DIAGNOSIS — D509 Iron deficiency anemia, unspecified: Secondary | ICD-10-CM | POA: Diagnosis not present

## 2021-12-24 DIAGNOSIS — E876 Hypokalemia: Secondary | ICD-10-CM | POA: Diagnosis not present

## 2021-12-24 DIAGNOSIS — N186 End stage renal disease: Secondary | ICD-10-CM | POA: Diagnosis not present

## 2021-12-24 DIAGNOSIS — N2581 Secondary hyperparathyroidism of renal origin: Secondary | ICD-10-CM | POA: Diagnosis not present

## 2021-12-24 DIAGNOSIS — Z992 Dependence on renal dialysis: Secondary | ICD-10-CM | POA: Diagnosis not present

## 2021-12-25 DIAGNOSIS — I159 Secondary hypertension, unspecified: Secondary | ICD-10-CM | POA: Diagnosis not present

## 2021-12-25 DIAGNOSIS — N186 End stage renal disease: Secondary | ICD-10-CM | POA: Diagnosis not present

## 2021-12-25 DIAGNOSIS — Z992 Dependence on renal dialysis: Secondary | ICD-10-CM | POA: Diagnosis not present

## 2021-12-26 DIAGNOSIS — Z992 Dependence on renal dialysis: Secondary | ICD-10-CM | POA: Diagnosis not present

## 2021-12-26 DIAGNOSIS — N2581 Secondary hyperparathyroidism of renal origin: Secondary | ICD-10-CM | POA: Diagnosis not present

## 2021-12-26 DIAGNOSIS — E876 Hypokalemia: Secondary | ICD-10-CM | POA: Diagnosis not present

## 2021-12-26 DIAGNOSIS — D631 Anemia in chronic kidney disease: Secondary | ICD-10-CM | POA: Diagnosis not present

## 2021-12-26 DIAGNOSIS — D509 Iron deficiency anemia, unspecified: Secondary | ICD-10-CM | POA: Diagnosis not present

## 2021-12-26 DIAGNOSIS — N186 End stage renal disease: Secondary | ICD-10-CM | POA: Diagnosis not present

## 2021-12-29 DIAGNOSIS — Z992 Dependence on renal dialysis: Secondary | ICD-10-CM | POA: Diagnosis not present

## 2021-12-29 DIAGNOSIS — E876 Hypokalemia: Secondary | ICD-10-CM | POA: Diagnosis not present

## 2021-12-29 DIAGNOSIS — N2581 Secondary hyperparathyroidism of renal origin: Secondary | ICD-10-CM | POA: Diagnosis not present

## 2021-12-29 DIAGNOSIS — D631 Anemia in chronic kidney disease: Secondary | ICD-10-CM | POA: Diagnosis not present

## 2021-12-29 DIAGNOSIS — N186 End stage renal disease: Secondary | ICD-10-CM | POA: Diagnosis not present

## 2021-12-29 DIAGNOSIS — D509 Iron deficiency anemia, unspecified: Secondary | ICD-10-CM | POA: Diagnosis not present

## 2021-12-30 ENCOUNTER — Other Ambulatory Visit: Payer: Self-pay

## 2021-12-30 DIAGNOSIS — I5032 Chronic diastolic (congestive) heart failure: Secondary | ICD-10-CM

## 2021-12-30 DIAGNOSIS — I110 Hypertensive heart disease with heart failure: Secondary | ICD-10-CM

## 2021-12-30 MED ORDER — ISOSORB DINITRATE-HYDRALAZINE 20-37.5 MG PO TABS: 1.0000 | ORAL_TABLET | ORAL | 3 refills | Status: DC

## 2021-12-31 DIAGNOSIS — D631 Anemia in chronic kidney disease: Secondary | ICD-10-CM | POA: Diagnosis not present

## 2021-12-31 DIAGNOSIS — Z992 Dependence on renal dialysis: Secondary | ICD-10-CM | POA: Diagnosis not present

## 2021-12-31 DIAGNOSIS — D509 Iron deficiency anemia, unspecified: Secondary | ICD-10-CM | POA: Diagnosis not present

## 2021-12-31 DIAGNOSIS — E876 Hypokalemia: Secondary | ICD-10-CM | POA: Diagnosis not present

## 2021-12-31 DIAGNOSIS — N2581 Secondary hyperparathyroidism of renal origin: Secondary | ICD-10-CM | POA: Diagnosis not present

## 2021-12-31 DIAGNOSIS — N186 End stage renal disease: Secondary | ICD-10-CM | POA: Diagnosis not present

## 2022-01-01 ENCOUNTER — Encounter (INDEPENDENT_AMBULATORY_CARE_PROVIDER_SITE_OTHER): Payer: Self-pay | Admitting: Ophthalmology

## 2022-01-01 ENCOUNTER — Ambulatory Visit (INDEPENDENT_AMBULATORY_CARE_PROVIDER_SITE_OTHER): Payer: Medicare Other | Admitting: Ophthalmology

## 2022-01-01 DIAGNOSIS — H35371 Puckering of macula, right eye: Secondary | ICD-10-CM

## 2022-01-01 DIAGNOSIS — E113512 Type 2 diabetes mellitus with proliferative diabetic retinopathy with macular edema, left eye: Secondary | ICD-10-CM | POA: Diagnosis not present

## 2022-01-01 DIAGNOSIS — E113511 Type 2 diabetes mellitus with proliferative diabetic retinopathy with macular edema, right eye: Secondary | ICD-10-CM

## 2022-01-01 MED ORDER — AFLIBERCEPT 2MG/0.05ML IZ SOLN FOR KALEIDOSCOPE
2.0000 mg | INTRAVITREAL | Status: AC | PRN
  Administered 2022-01-01: 2 mg via INTRAVITREAL

## 2022-01-01 NOTE — Assessment & Plan Note (Signed)
Severe epiretinal membrane will likely improve acuity with vitrectomy membrane peel yet acuity today is reasonably good.  Patient's medical health will require surgical intervention in the hospital setting.  Yet with good acuity today we will continue observe

## 2022-01-01 NOTE — Assessment & Plan Note (Signed)
Stable PDR OD

## 2022-01-01 NOTE — Assessment & Plan Note (Addendum)
Massive CSME OS.  We will repeat tinjection into vegF today, resistant to Avastin OS in the past,  and use Eylea OS today.  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

## 2022-01-01 NOTE — Progress Notes (Signed)
01/01/2022     CHIEF COMPLAINT Patient presents for  Chief Complaint  Patient presents with   Diabetic Retinopathy with Macular Edema      HISTORY OF PRESENT ILLNESS: Destiny Day is a 71 y.o. female who presents to the clinic today for:   HPI   5 weeks for OS, EYLEA, OCT. Pt stated vision has improved since last visit. Pt reports that images are more clear. Pt denies new floaters and FOL.   Last edited by Silvestre Moment on 01/01/2022 11:05 AM.      Referring physician: Cipriano Mile, NP [place],  [place] [id number]  HISTORICAL INFORMATION:   Selected notes from the MEDICAL RECORD NUMBER    Lab Results  Component Value Date   HGBA1C 6.4 (H) 08/04/2021     CURRENT MEDICATIONS: Current Outpatient Medications (Ophthalmic Drugs)  Medication Sig   latanoprost (XALATAN) 0.005 % ophthalmic solution Place 1 drop into the left eye at bedtime.   No current facility-administered medications for this visit. (Ophthalmic Drugs)   Current Outpatient Medications (Other)  Medication Sig   albuterol (PROAIR HFA) 108 (90 Base) MCG/ACT inhaler Inhale 2 puffs into the lungs every 4 (four) hours as needed for wheezing or shortness of breath.   amLODipine (NORVASC) 10 MG tablet Take 10 mg by mouth at bedtime.   aspirin EC 81 MG tablet Take 81 mg by mouth daily.   atorvastatin (LIPITOR) 80 MG tablet Take 80 mg by mouth at bedtime.   busPIRone (BUSPAR) 7.5 MG tablet Take 7.5 mg by mouth 2 (two) times daily as needed (anxiety).   carvedilol (COREG) 12.5 MG tablet Take 12.5 mg by mouth See admin instructions. Take 1 tablet (12.5 mg) by mouth once daily in the evening on Mondays, Wednesdays, & Fridays. (Dialysis) Take 1 tablet (12.5 mg) by mouth twice daily on Sundays, Tuesdays, Thursdays, & Saturdays. (Non Dialysis)   cinacalcet (SENSIPAR) 90 MG tablet Take 90 mg by mouth See admin instructions. Take 1 tablet (90 mg) by mouth in the evening on Mondays, Wednesdays, & Fridays  (dialysis) Take 1 tablet (90 mg) by mouth twice daily on Sundays, Tuesdays, Thursdays, Saturdays. (non dialysis)   EPINEPHrine (AUVI-Q) 0.3 mg/0.3 mL IJ SOAJ injection Inject 0.3 mg into the muscle as needed for anaphylaxis.   ferric citrate (AURYXIA) 1 GM 210 MG(Fe) tablet Take 210-420 mg by mouth See admin instructions. Take 2 tablets (420 mg) by mouth with each meal & take 1 tablet (210 mg) by mouth with snacks.   fluticasone (FLONASE) 50 MCG/ACT nasal spray Place 2 sprays into both nostrils daily as needed for allergies or rhinitis.   fluticasone-salmeterol (ADVAIR HFA) 115-21 MCG/ACT inhaler Inhale two puffs twice daily (Patient not taking: Reported on 08/03/2021)   gabapentin (NEURONTIN) 100 MG capsule TAKE 1 CAPSULE (100 MG TOTAL) BY MOUTH AT BEDTIME. WHEN NECESSARY FOR NEUROPATHY PAIN (Patient taking differently: Take 100 mg by mouth at bedtime as needed (pain). When necessary for neuropathy pain)   glipiZIDE (GLUCOTROL) 5 MG tablet Take 5 mg by mouth daily before breakfast.   guaiFENesin-codeine 100-10 MG/5ML syrup Take 5 mLs by mouth 3 (three) times daily as needed for cough.   isosorbide-hydrALAZINE (BIDIL) 20-37.5 MG tablet Take 1 tablet by mouth See admin instructions. hs on Monday,Wednesday,Friday (dialysis days) Tid on Tuesday,Thursday,Saturday,Sunday(non-dialysis days)   lidocaine-prilocaine (EMLA) cream Apply 1 application topically 3 (three) times a week. 1-2 hours prior to dialysis   loperamide (IMODIUM) 2 MG capsule Take 2 capsules (4  mg total) by mouth 3 (three) times daily as needed for diarrhea or loose stools.   montelukast (SINGULAIR) 10 MG tablet TAKE 1 TABLET BY MOUTH EVERYDAY AT BEDTIME   omeprazole (PRILOSEC) 20 MG capsule Take 20 mg by mouth daily.    ondansetron (ZOFRAN) 4 MG tablet Take 1 tablet (4 mg total) by mouth every 6 (six) hours as needed for nausea.   polyethylene glycol (MIRALAX / GLYCOLAX) 17 g packet Take 17 g by mouth daily. (Patient taking differently: Take  17 g by mouth daily as needed for mild constipation.)   Spacer/Aero-Holding Chambers (AEROCHAMBER PLUS) inhaler Use with inhaler   traZODone (DESYREL) 50 MG tablet Take 50 mg by mouth at bedtime as needed for sleep.   Vitamin D, Ergocalciferol, (DRISDOL) 1.25 MG (50000 UT) CAPS capsule Take 50,000 Units by mouth every Sunday. Takes on Sunday (Patient not taking: Reported on 08/03/2021)   No current facility-administered medications for this visit. (Other)      REVIEW OF SYSTEMS: ROS   Negative for: Constitutional, Gastrointestinal, Neurological, Skin, Genitourinary, Musculoskeletal, HENT, Endocrine, Cardiovascular, Eyes, Respiratory, Psychiatric, Allergic/Imm, Heme/Lymph Last edited by Silvestre Moment on 01/01/2022 11:05 AM.       ALLERGIES Allergies  Allergen Reactions   Adhesive  [Tape] Hives   Lisinopril Cough   Prednisone Swelling and Other (See Comments)    Excessive fluid buildup    PAST MEDICAL HISTORY Past Medical History:  Diagnosis Date   Abdominal bruit    Anxiety    Arthritis    Osteoarthritis   Asthma    Cervical disc disease    "pinced nerve"   CHF (congestive heart failure) (HCC)    Diabetes mellitus    Type II   Diverticulitis    ESRD (end stage renal disease) ([place])    dialysis - M/W/F- [place]   GERD (gastroesophageal reflux disease)    from medications   GI bleed 03/31/2013   Head injury 07/2017   History of hiatal hernia    Hyperlipidemia    Hypertension    Neuropathy    left leg   Nuclear sclerotic cataract of right eye 04/23/2020   Osteoporosis    Peripheral vascular disease ([place])    Pneumonia    "very young" and a few years ago   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)    Vitrectomy membrane peel endolaser 05-07-2021   Seasonal allergies    Shortness of breath dyspnea    WIth exertion, when fluid builds   Sleep apnea    can't afford cpap    Vitreomacular traction syndrome,  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   05-07-2021  Vitrectomy, membrane peel, PRP, resection posterior segment of NVD, remnants of peripheral NVE remain in place due to atrophic retina, no vitreous substitute   Past Surgical History:  Procedure Laterality Date   A/V SHUNTOGRAM N/A 09/22/2016   Procedure: A/V Shuntogram - left arm;  Surgeon: Serafina Mitchell, MD;  Location: [facility];  Service: Cardiovascular;  Laterality: N/A;   A/V SHUNTOGRAM N/A 03/22/2018   Procedure: A/V SHUNTOGRAM - left arm;  Surgeon: Serafina Mitchell, MD;  Location: [facility];  Service: Cardiovascular;  Laterality: N/A;   A/V SHUNTOGRAM Left 11/17/2018   Procedure: A/V SHUNTOGRAM;  Surgeon: Angelia Mould, MD;  Location: [facility];  Service: Cardiovascular;  Laterality: Left;   ABDOMINAL HYSTERECTOMY  1993`   AMPUTATION Left 09/01/2016   Procedure: LEFT FOOT TRANSMETATARSAL AMPUTATION;  Surgeon: Newt Minion, MD;  Location: [facility];  Service: Orthopedics;  Laterality: Left;   AMPUTATION Right 08/11/2017   Procedure: RIGHT GREAT TOE AMPUTATION DIGIT;  Surgeon: Rosetta Posner, MD;  Location: [facility];  Service: Vascular;  Laterality: Right;   AMPUTATION Right 12/31/2017   Procedure: RIGHT TRANSMETATARSAL AMPUTATION;  Surgeon: Newt Minion, MD;  Location: [facility];  Service: Orthopedics;  Laterality: Right;   AV FISTULA PLACEMENT Left 04/21/2016   Procedure: INSERTION OF ARTERIOVENOUS (AV) GORE-TEX GRAFT ARM LEFT;  Surgeon: Elam Dutch, MD;  Location: [facility];  Service: Vascular;  Laterality: Left;   BASCILIC VEIN TRANSPOSITION Left 07/10/2014   Procedure: BASCILIC VEIN TRANSPOSITION;  Surgeon: Angelia Mould, MD;  Location: [facility];  Service: Vascular;  Laterality: Left;   [place] Right 11/08/2014   Procedure: FIRST STAGE BASILIC VEIN TRANSPOSITION;  Surgeon: Angelia Mould, MD;  Location: [place];  Service: Vascular;  Laterality: Right;   [place] Right 01/18/2015    Procedure: SECOND STAGE BASILIC VEIN TRANSPOSITION;  Surgeon: Angelia Mould, MD;  Location: [facility];  Service: Vascular;  Laterality: Right;   CRANIOTOMY N/A 08/23/2017   Procedure: CRANIOTOMY HEMATOMA EVACUATION SUBDURAL;  Surgeon: Ashok Pall, MD;  Location: [facility];  Service: Neurosurgery;  Laterality: N/A;   CRANIOTOMY Left 08/24/2017   Procedure: CRANIOTOMY FOR RECURRENT ACUTE SUBDURAL HEMATOMA;  Surgeon: Ashok Pall, MD;  Location: [facility];  Service: Neurosurgery;  Laterality: Left;   ESOPHAGOGASTRODUODENOSCOPY N/A 03/31/2013   Procedure: ESOPHAGOGASTRODUODENOSCOPY (EGD);  Surgeon: Gatha Mayer, MD;  [facility];  Service: Endoscopy;  Laterality: N/A;   EYE SURGERY     laser surgery   FEMORAL-POPLITEAL BYPASS GRAFT Left 07/08/2016   Procedure: LEFT  FEMORAL-BELOW KNEE POPLITEAL ARTERY BYPASS GRAFT USING 6MM X 80 CM PROPATEN GORETEX GRAFT WITH RINGS.;  Surgeon: Rosetta Posner, MD;  Location: [facility] OR;  Service: Vascular;  Laterality: Left;   FEMORAL-POPLITEAL BYPASS GRAFT Right 08/11/2017   Procedure: RIGHT FEMORAL TO BELOW KNEE POPLITEAL ARTERKY  BYPASS GRAFT USING 6MM RINGED PROPATEN GRAFT;  Surgeon: Rosetta Posner, MD;  Location: [place];  Service: Vascular;  Laterality: Right;   FISTULOGRAM Left 10/29/2014   Procedure: FISTULOGRAM;  Surgeon: Angelia Mould, MD;  Location: [facility] CATH LAB;  Service: Cardiovascular;  Laterality: Left;   GAS/FLUID EXCHANGE Left 12/20/2018   Procedure: AIR GAS EXCHANGE;  Surgeon: Jalene Mullet, MD;  Location: [place];  Service: Ophthalmology;  Laterality: Left;   INSERTION OF DIALYSIS CATHETER N/A 09/05/2020   Procedure: INSERTION OF DIALYSIS CATHETER;  Surgeon: Cherre Robins, MD;  Location: [place];  Service: Vascular;  Laterality: N/A;   INSERTION OF DIALYSIS CATHETER Left 03/13/2021   Procedure: INSERTION OF DIALYSIS CATHETER;  Surgeon: Waynetta Sandy, MD;  Location: [facility];  Service: Vascular;  Laterality: Left;   KNEE ARTHROSCOPY Right  05/06/2018   Procedure: RIGHT KNEE ARTHROSCOPY;  Surgeon: Newt Minion, MD;  Location: [place];  Service: Orthopedics;  Laterality: Right;   KNEE ARTHROSCOPY Right 06/10/2018   Procedure: RIGHT KNEE ARTHROSCOPY AND DEBRIDEMENT;  Surgeon: Newt Minion, MD;  Location: [place];  Service: Orthopedics;  Laterality: Right;   LIGATION OF ARTERIOVENOUS  FISTULA Left 04/21/2016   Procedure: LIGATION OF ARTERIOVENOUS  FISTULA LEFT ARM;  Surgeon: Elam Dutch, MD;  Location: [place];  Service: Vascular;  Laterality: Left;   LOWER EXTREMITY ANGIOGRAPHY N/A 04/06/2017   Procedure: Lower Extremity Angiography - Right;  Surgeon:  Serafina Mitchell, MD;  Location: [facility];  Service: Cardiovascular;  Laterality: N/A;   MEMBRANE PEEL Left 12/20/2018   Procedure: MEMBRANE PEEL;  Surgeon: Jalene Mullet, MD;  Location: [place];  Service: Ophthalmology;  Laterality: Left;   PATCH ANGIOPLASTY Right 01/18/2015   Procedure: BASILIC VEIN PATCH ANGIOPLASTY USING VASCUGUARD PATCH;  Surgeon: Angelia Mould, MD;  Location: [place];  Service: Vascular;  Laterality: Right;   PERIPHERAL VASCULAR BALLOON ANGIOPLASTY  09/22/2016   Procedure: Peripheral Vascular Balloon Angioplasty;  Surgeon: Serafina Mitchell, MD;  Location: [facility] [facility];  Service: Cardiovascular;;  Lt. Fistula   PERIPHERAL VASCULAR BALLOON ANGIOPLASTY Left 03/22/2018   Procedure: PERIPHERAL VASCULAR BALLOON ANGIOPLASTY;  Surgeon: Serafina Mitchell, MD;  Location: [place] [facility];  Service: Cardiovascular;  Laterality: Left;  Arm shunt   PERIPHERAL VASCULAR CATHETERIZATION N/A 06/23/2016   Procedure: Abdominal Aortogram w/Lower Extremity;  Surgeon: Serafina Mitchell, MD;  Location: [place] [facility];  Service: Cardiovascular;  Laterality: N/A;   PERIPHERAL VASCULAR CATHETERIZATION  06/23/2016   Procedure: Peripheral Vascular Intervention;  Surgeon: Serafina Mitchell, MD;  Location: [place] [facility];  Service: Cardiovascular;;  lt common and external  illiac artery   PHOTOCOAGULATION WITH LASER Left 12/20/2018   Procedure: PHOTOCOAGULATION WITH LASER;  Surgeon: Jalene Mullet, MD;  Location: [place];  Service: Ophthalmology;  Laterality: Left;   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   Procedure: REPAIR OF COMPLEX TRACTION RETINAL DETACHMENT;  Surgeon: Jalene Mullet, MD;  Location: [place];  Service: Ophthalmology;  Laterality: Left;   REVISION OF ARTERIOVENOUS GORETEX GRAFT Left 09/05/2020   Procedure: REVISION OF ARTERIOVENOUS GORETEX GRAFT LEFT;  Surgeon: Cherre Robins, MD;  Location: [facility];  Service: Vascular;  Laterality: Left;    FAMILY HISTORY Family History  Problem Relation Age of Onset   Other Mother        not sure of cause of death   Diabetes Father    Diabetes Sister    Diabetes Sister    Pancreatic cancer Maternal Grandmother    Colon cancer Neg Hx     SOCIAL HISTORY Social History   Tobacco Use   Smoking status: Former    Packs/day: 0.35    Years: 40.00    Total pack years: 14.00    Types: Cigarettes    Quit date: 05/10/2012    Years since quitting: 9.6   Smokeless tobacco: Never  Vaping Use   Vaping Use: Never used  Substance Use Topics   Alcohol use: No   Drug use: No    Comment: marijuana; quit in early 1980's         OPHTHALMIC EXAM:  Base Eye Exam     Visual Acuity (ETDRS)       Right Left   Dist [place] 20/40 -2 20/80 -2   Dist ph [place] NI NI         Tonometry (Tonopen, 11:11 AM)       Right Left   Pressure 15 14         Pupils       Pupils APD   Right PERRL None   Left PERRL None         Visual Fields       Left Right    Full Full         Extraocular Movement       Right Left    Full Full  Neuro/Psych     Oriented x3: Yes   Mood/Affect: Normal         Dilation     Left eye: 2.5% Phenylephrine, 1.0% Mydriacyl @ 11:11 AM           Slit Lamp and Fundus Exam     External Exam       Right Left   External Normal  Normal         Slit Lamp Exam       Right Left   Lids/Lashes Normal Normal   Conjunctiva/Sclera White and quiet White and quiet   Cornea Clear Clear   Anterior Chamber Deep and quiet Deep and quiet   Iris Round and reactive Round and reactive   Lens Centered posterior chamber intraocular lens Centered posterior chamber intraocular lens   Anterior Vitreous Normal Normal, remnant         Fundus Exam       Right Left   Posterior Vitreous  Clear, avitric   Disc  1+ Pallor   C/D Ratio  0.5   Macula  Severe clinically significant macular edema appears to emanate from region temporal aspect of the fovea with large cluster of microaneurysms   Vessels  Clear resection of previous fibrovascular disease clear avitric   Periphery  Good peripheral PRP            IMAGING AND PROCEDURES  Imaging and Procedures for 01/01/22  OCT, Retina - OU - Both Eyes       Right Eye Central Foveal Thickness: 545. Progression has been stable. Findings include abnormal foveal contour, epiretinal membrane.   Left Eye Central Foveal Thickness: 701. Progression has worsened.   Notes OD with residual ILM tortuosity contributing to the continued thickening OD will likely need vitrectomy membrane peel OD, with severe white epiretinal fibrosis over the fovea, if visual acuity affected  OS with persistent and increasing CSME, will need antivegF therapy today to maintain      Intravitreal Injection, Pharmacologic Agent - OS - Left Eye       Time Out 01/01/2022. 11:30 AM. Confirmed correct patient, procedure, site, and patient consented.   Anesthesia Topical anesthesia was used. Anesthetic medications included Lidocaine 4%.   Procedure Preparation included 5% betadine to ocular surface, 10% betadine to eyelids, Tobramycin 0.3%. A 30 gauge needle was used.   Injection: 2 mg aflibercept 2 MG/0.05ML   Route: Intravitreal, Site: Left Eye   NDC: [id number], Lot: [id number], Waste: 0 mL    Post-op Post injection exam found visual acuity of at least counting fingers. The patient tolerated the procedure well. There were no complications. The patient received written and verbal post procedure care education. Post injection medications were not given.              ASSESSMENT/PLAN:  Macular pucker, right eye Severe epiretinal membrane will likely improve acuity with vitrectomy membrane peel yet acuity today is reasonably good.  Patient's medical health will require surgical intervention in the hospital setting.  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  We will repeat tinjection into vegF today, resistant to Avastin OS in the past,  and use Eylea OS today.  Likely underlying cause is macular nonperfusion     ICD-10-CM  1. 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 type 2 diabetes mellitus (HCC)  H74.1638 OCT, Retina - OU - Both Eyes    Intravitreal Injection, Pharmacologic Agent - OS - Left Eye    aflibercept (EYLEA) SOLN 2 mg    2. Macular pucker, right eye  H35.371     3. Diabetic macular edema of right eye with proliferative retinopathy associated with type 2 diabetes mellitus ([place])  E11.3511       OS with chronic active CSME proven to be resistant to Avastin.  We will change to injection of intravitreal Eylea OS today to try to recover improved anatomy and improve macular thickening and acuity  2.  OD severe epiretinal membrane by OCT persists yet with reasonably good vision today.  Patient would like to continue observe  3.  Ophthalmic Meds Ordered this visit:  Meds ordered this encounter  Medications   aflibercept (EYLEA) SOLN 2 mg       Return  in about 6 weeks (around 02/12/2022) for DILATE OU, EYLEA OCT, OS.  There are no Patient Instructions on file for this visit.   Explained the diagnoses, plan, and follow up with the patient and they expressed understanding.  Patient expressed understanding of the importance of proper follow up care.   Clent Demark Lianne Carreto M.D. Diseases & Surgery of the Retina and Vitreous Retina & Diabetic [place] 01/01/22     Abbreviations: M myopia (nearsighted); A astigmatism; H hyperopia (farsighted); P presbyopia; Mrx spectacle prescription;  CTL contact lenses; OD right eye; OS left eye; OU both eyes  XT exotropia; ET esotropia; PEK punctate epithelial keratitis; PEE punctate epithelial erosions; DES dry eye syndrome; MGD meibomian gland dysfunction; ATs artificial tears; PFAT's preservative free artificial tears; [place] nuclear sclerotic cataract; PSC posterior subcapsular cataract; ERM epi-retinal membrane; PVD posterior vitreous detachment; RD retinal detachment; DM diabetes mellitus; DR diabetic retinopathy; NPDR non-proliferative diabetic retinopathy; PDR proliferative diabetic retinopathy; CSME clinically significant macular edema; DME diabetic macular edema; dbh dot blot hemorrhages; CWS cotton wool spot; POAG primary open angle glaucoma; C/D cup-to-disc ratio; HVF humphrey visual field; GVF goldmann visual field; OCT optical coherence tomography; IOP intraocular pressure; BRVO Branch retinal vein occlusion; CRVO central retinal vein occlusion; CRAO central retinal artery occlusion; BRAO branch retinal artery occlusion; RT retinal tear; SB scleral buckle; PPV pars plana vitrectomy; VH Vitreous hemorrhage; PRP panretinal laser photocoagulation; IVK intravitreal kenalog; VMT vitreomacular traction; MH Macular hole;  NVD neovascularization of the disc; NVE neovascularization elsewhere; AREDS age related eye disease study; ARMD age related macular degeneration; POAG primary open angle glaucoma; EBMD  epithelial/anterior basement membrane dystrophy; ACIOL anterior chamber intraocular lens; IOL intraocular lens; PCIOL posterior chamber intraocular lens; Phaco/IOL phacoemulsification with intraocular lens placement; [place] photorefractive keratectomy; LASIK laser assisted in situ keratomileusis; HTN hypertension; DM diabetes mellitus; COPD chronic obstructive pulmonary disease

## 2022-01-02 DIAGNOSIS — N186 End stage renal disease: Secondary | ICD-10-CM | POA: Diagnosis not present

## 2022-01-02 DIAGNOSIS — Z992 Dependence on renal dialysis: Secondary | ICD-10-CM | POA: Diagnosis not present

## 2022-01-02 DIAGNOSIS — N2581 Secondary hyperparathyroidism of renal origin: Secondary | ICD-10-CM | POA: Diagnosis not present

## 2022-01-02 DIAGNOSIS — D631 Anemia in chronic kidney disease: Secondary | ICD-10-CM | POA: Diagnosis not present

## 2022-01-02 DIAGNOSIS — E876 Hypokalemia: Secondary | ICD-10-CM | POA: Diagnosis not present

## 2022-01-02 DIAGNOSIS — D509 Iron deficiency anemia, unspecified: Secondary | ICD-10-CM | POA: Diagnosis not present

## 2022-01-05 DIAGNOSIS — N2581 Secondary hyperparathyroidism of renal origin: Secondary | ICD-10-CM | POA: Diagnosis not present

## 2022-01-05 DIAGNOSIS — E876 Hypokalemia: Secondary | ICD-10-CM | POA: Diagnosis not present

## 2022-01-05 DIAGNOSIS — Z992 Dependence on renal dialysis: Secondary | ICD-10-CM | POA: Diagnosis not present

## 2022-01-05 DIAGNOSIS — D509 Iron deficiency anemia, unspecified: Secondary | ICD-10-CM | POA: Diagnosis not present

## 2022-01-05 DIAGNOSIS — D631 Anemia in chronic kidney disease: Secondary | ICD-10-CM | POA: Diagnosis not present

## 2022-01-05 DIAGNOSIS — N186 End stage renal disease: Secondary | ICD-10-CM | POA: Diagnosis not present

## 2022-01-06 ENCOUNTER — Ambulatory Visit (INDEPENDENT_AMBULATORY_CARE_PROVIDER_SITE_OTHER): Payer: Medicare Other

## 2022-01-06 DIAGNOSIS — J309 Allergic rhinitis, unspecified: Secondary | ICD-10-CM

## 2022-01-07 DIAGNOSIS — Z992 Dependence on renal dialysis: Secondary | ICD-10-CM | POA: Diagnosis not present

## 2022-01-07 DIAGNOSIS — N2581 Secondary hyperparathyroidism of renal origin: Secondary | ICD-10-CM | POA: Diagnosis not present

## 2022-01-07 DIAGNOSIS — D509 Iron deficiency anemia, unspecified: Secondary | ICD-10-CM | POA: Diagnosis not present

## 2022-01-07 DIAGNOSIS — E876 Hypokalemia: Secondary | ICD-10-CM | POA: Diagnosis not present

## 2022-01-07 DIAGNOSIS — D631 Anemia in chronic kidney disease: Secondary | ICD-10-CM | POA: Diagnosis not present

## 2022-01-07 DIAGNOSIS — N186 End stage renal disease: Secondary | ICD-10-CM | POA: Diagnosis not present

## 2022-01-08 ENCOUNTER — Ambulatory Visit: Payer: Medicare Other | Admitting: Student

## 2022-01-08 ENCOUNTER — Ambulatory Visit: Payer: Medicare Other | Admitting: Cardiology

## 2022-01-08 ENCOUNTER — Encounter: Payer: Self-pay | Admitting: Student

## 2022-01-08 VITALS — BP 107/65 | HR 106 | Temp 98.1°F | Resp 17 | Ht 65.0 in | Wt 141.2 lb

## 2022-01-08 DIAGNOSIS — I110 Hypertensive heart disease with heart failure: Secondary | ICD-10-CM

## 2022-01-08 DIAGNOSIS — I5032 Chronic diastolic (congestive) heart failure: Secondary | ICD-10-CM | POA: Diagnosis not present

## 2022-01-08 MED ORDER — ISOSORB DINITRATE-HYDRALAZINE 20-37.5 MG PO TABS: 1.0000 | ORAL_TABLET | ORAL | 3 refills | Status: AC

## 2022-01-08 NOTE — Progress Notes (Signed)
Primary Physician/Referring:  Cipriano Mile, NP  Patient ID: Destiny Day, female    DOB: 1950/07/31, 71 y.o.   MRN: [id number]  Chief Complaint  Patient presents with   chronic diastolic heart failure   hypertensive heart disease    1 year   HPI:    Destiny Day  is a 71 y.o. female  Destiny Day female with difficult to control hypertension in the past, uncontrolled diabetes mellitus, hyperlipidemia and end-stage renal disease on hemodialysis since 2017, no CAD by angiography surprisingly in 2007, peripheral arterial disease with history of left iliac artery stenting and bilateral femoro-popliteal grafting,  s/p left transmetatarsal amputation on 09/01/2016, and right TMA on 12-31-17 by Dr. Sharol Given due to diabetic non healing ulceration, also has diabetic retinopathy.  Patient was last seen in our office 01/09/2021 by Dr. Einar Gip at which time ordered repeat echocardiogram revealing LVEF 55% with moderate LVH and grade 1 diastolic dysfunction.  Patient now presents for 1 year follow up.  Patient continues to follow closely with vein and vascular as well as nephrology.  She is currently on a Monday Wednesday Friday dialysis schedule.  Patient notes episodes of dizziness and lightheadedness associated with soft blood pressure readings on home monitoring.  Denies chest pain, significant dyspnea.  Past Medical History:  Diagnosis Date   Abdominal bruit    Anxiety    Arthritis    Osteoarthritis   Asthma    Cervical disc disease    "pinced nerve"   CHF (congestive heart failure) (HCC)    Diabetes mellitus    Type II   Diverticulitis    ESRD (end stage renal disease) ([place])    dialysis - M/W/F- [place]   GERD (gastroesophageal reflux disease)    from medications   GI bleed 03/31/2013   Head injury 07/2017   History of hiatal hernia    Hyperlipidemia    Hypertension    Neuropathy    left leg   Nuclear sclerotic cataract of right eye 04/23/2020    Osteoporosis    Peripheral vascular disease ([place])    Pneumonia    "very young" and a few years ago   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)    Vitrectomy membrane peel endolaser 05-07-2021   Seasonal allergies    Shortness of breath dyspnea    WIth exertion, when fluid builds   Sleep apnea    can't afford cpap    Vitreomacular traction syndrome, 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   05-07-2021  Vitrectomy, membrane peel, PRP, resection posterior segment of NVD, remnants of peripheral NVE remain in place due to atrophic retina, no vitreous substitute   Past Surgical History:  Procedure Laterality Date   A/V SHUNTOGRAM N/A 09/22/2016   Procedure: A/V Shuntogram - left arm;  Surgeon: Serafina Mitchell, MD;  Location: [place] [facility];  Service: Cardiovascular;  Laterality: N/A;   A/V SHUNTOGRAM N/A 03/22/2018   Procedure: A/V SHUNTOGRAM - left arm;  Surgeon: Serafina Mitchell, MD;  Location: [place] [facility];  Service: Cardiovascular;  Laterality: N/A;   A/V SHUNTOGRAM Left 11/17/2018   Procedure: A/V SHUNTOGRAM;  Surgeon: Angelia Mould, MD;  Location: [place] [facility];  Service: Cardiovascular;  Laterality: Left;   ABDOMINAL HYSTERECTOMY  1993`   AMPUTATION Left 09/01/2016   Procedure: LEFT FOOT TRANSMETATARSAL AMPUTATION;  Surgeon: Newt Minion, MD;  Location: [place];  Service: Orthopedics;  Laterality: Left;   AMPUTATION Right  08/11/2017   Procedure: RIGHT GREAT TOE AMPUTATION DIGIT;  Surgeon: Rosetta Posner, MD;  Location: [place];  Service: Vascular;  Laterality: Right;   AMPUTATION Right 12/31/2017   Procedure: RIGHT TRANSMETATARSAL AMPUTATION;  Surgeon: Newt Minion, MD;  Location: [place];  Service: Orthopedics;  Laterality: Right;   AV FISTULA PLACEMENT Left 04/21/2016   Procedure: INSERTION OF ARTERIOVENOUS (AV) GORE-TEX GRAFT ARM LEFT;  Surgeon: Elam Dutch, MD;  Location: [place];  Service: Vascular;  Laterality: Left;    BASCILIC VEIN TRANSPOSITION Left 07/10/2014   Procedure: BASCILIC VEIN TRANSPOSITION;  Surgeon: Angelia Mould, MD;  Location: [place];  Service: Vascular;  Laterality: Left;   [place] Right 11/08/2014   Procedure: FIRST STAGE BASILIC VEIN TRANSPOSITION;  Surgeon: Angelia Mould, MD;  Location: [place];  Service: Vascular;  Laterality: Right;   [place] Right 01/18/2015   Procedure: SECOND STAGE BASILIC VEIN TRANSPOSITION;  Surgeon: Angelia Mould, MD;  Location: [place];  Service: Vascular;  Laterality: Right;   CRANIOTOMY N/A 08/23/2017   Procedure: CRANIOTOMY HEMATOMA EVACUATION SUBDURAL;  Surgeon: Ashok Pall, MD;  Location: [place];  Service: Neurosurgery;  Laterality: N/A;   CRANIOTOMY Left 08/24/2017   Procedure: CRANIOTOMY FOR RECURRENT ACUTE SUBDURAL HEMATOMA;  Surgeon: Ashok Pall, MD;  Location: [place];  Service: Neurosurgery;  Laterality: Left;   ESOPHAGOGASTRODUODENOSCOPY N/A 03/31/2013   Procedure: ESOPHAGOGASTRODUODENOSCOPY (EGD);  Surgeon: Gatha Mayer, MD;  Location[facility] END[facility];  Service: Endoscopy;  Laterality: N/A;   EYE SURGERY     laser surgery   FEMORAL-POPLITEAL BYPASS GRAFT Left 07/08/2016   Procedure: LEFT  FEMORAL-BELOW KNEE POPLITEAL ARTERY BYPASS GRAFT USING 6MM X 80 CM PROPATEN GORETEX GRAFT WITH RINGS.;  Surgeon: Rosetta Posner, MD;  Location: [facility];  Service: Vascular;  Laterality: Left;   FEMORAL-POPLITEAL BYPASS GRAFT Right 08/11/2017   Procedure: RIGHT FEMORAL TO BELOW KNEE POPLITEAL ARTERKY  BYPASS GRAFT USING 6MM RINGED PROPATEN GRAFT;  Surgeon: Rosetta Posner, MD;  Location: [place];  Service: Vascular;  Laterality: Right;   FISTULOGRAM Left 10/29/2014   Procedure: FISTULOGRAM;  Surgeon: Angelia Mould, MD;  Location: [facility] CATH LAB;  Service: Cardiovascular;  Laterality: Left;   GAS/FLUID EXCHANGE Left 12/20/2018   Procedure: AIR GAS EXCHANGE;  Surgeon: Jalene Mullet, MD;  Location: [place];  Service:  Ophthalmology;  Laterality: Left;   INSERTION OF DIALYSIS CATHETER N/A 09/05/2020   Procedure: INSERTION OF DIALYSIS CATHETER;  Surgeon: Cherre Robins, MD;  Location: [place];  Service: Vascular;  Laterality: N/A;   INSERTION OF DIALYSIS CATHETER Left 03/13/2021   Procedure: INSERTION OF DIALYSIS CATHETER;  Surgeon: Waynetta Sandy, MD;  Location: [place];  Service: Vascular;  Laterality: Left;   KNEE ARTHROSCOPY Right 05/06/2018   Procedure: RIGHT KNEE ARTHROSCOPY;  Surgeon: Newt Minion, MD;  Location: [place];  Service: Orthopedics;  Laterality: Right;   KNEE ARTHROSCOPY Right 06/10/2018   Procedure: RIGHT KNEE ARTHROSCOPY AND DEBRIDEMENT;  Surgeon: Newt Minion, MD;  Location: [place];  Service: Orthopedics;  Laterality: Right;   LIGATION OF ARTERIOVENOUS  FISTULA Left 04/21/2016   Procedure: LIGATION OF ARTERIOVENOUS  FISTULA LEFT ARM;  Surgeon: Elam Dutch, MD;  Location: [place];  Service: Vascular;  Laterality: Left;   LOWER EXTREMITY ANGIOGRAPHY N/A 04/06/2017   Procedure: Lower Extremity Angiography - Right;  Surgeon: Serafina [place], MD;  Location: [place] [facility];  Service: Cardiovascular;  Laterality: N/A;   MEMBRANE  PEEL Left 12/20/2018   Procedure: MEMBRANE PEEL;  Surgeon: Jalene Mullet, MD;  Location: [place];  Service: Ophthalmology;  Laterality: Left;   PATCH ANGIOPLASTY Right 01/18/2015   Procedure: BASILIC VEIN PATCH ANGIOPLASTY USING VASCUGUARD PATCH;  Surgeon: Angelia Mould, MD;  Location: [place];  Service: Vascular;  Laterality: Right;   PERIPHERAL VASCULAR BALLOON ANGIOPLASTY  09/22/2016   Procedure: Peripheral Vascular Balloon Angioplasty;  Surgeon: Serafina [place], MD;  Location: [place] [facility];  Service: Cardiovascular;;  Lt. Fistula   PERIPHERAL VASCULAR BALLOON ANGIOPLASTY Left 03/22/2018   Procedure: PERIPHERAL VASCULAR BALLOON ANGIOPLASTY;  Surgeon: Serafina [place], MD;  Location: [place] [facility];  Service: Cardiovascular;  Laterality: Left;   Arm shunt   PERIPHERAL VASCULAR CATHETERIZATION N/A 06/23/2016   Procedure: Abdominal Aortogram w/Lower Extremity;  Surgeon: Serafina [place], MD;  Location: [place] [facility];  Service: Cardiovascular;  Laterality: N/A;   PERIPHERAL VASCULAR CATHETERIZATION  06/23/2016   Procedure: Peripheral Vascular Intervention;  Surgeon: Serafina [place], MD;  Location: [place] [facility];  Service: Cardiovascular;;  lt common and external illiac artery   PHOTOCOAGULATION WITH LASER Left 12/20/2018   Procedure: PHOTOCOAGULATION WITH LASER;  Surgeon: Jalene Mullet, MD;  Location: [place];  Service: Ophthalmology;  Laterality: Left;   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   Procedure: REPAIR OF COMPLEX TRACTION RETINAL DETACHMENT;  Surgeon: Jalene Mullet, MD;  Location: [place];  Service: Ophthalmology;  Laterality: Left;   REVISION OF ARTERIOVENOUS GORETEX GRAFT Left 09/05/2020   Procedure: REVISION OF ARTERIOVENOUS GORETEX GRAFT LEFT;  Surgeon: Cherre Robins, MD;  Location: [facility] OR;  Service: Vascular;  Laterality: Left;   Family History  Problem Relation Age of Onset   Other Mother        not sure of cause of death   Diabetes Father    Diabetes Sister    Diabetes Sister    Pancreatic cancer Maternal Grandmother    Colon cancer Neg Hx     Social History   Tobacco Use   Smoking status: Former    Packs/day: 0.35    Years: 40.00    Total pack years: 14.00    Types: Cigarettes    Quit date: 05/10/2012    Years since quitting: 9.6   Smokeless tobacco: Never  Substance Use Topics   Alcohol use: No   Marital Status: Married  ROS  Review of Systems  Eyes:  Positive for blurred vision.  Cardiovascular:  Negative for dyspnea on exertion, leg swelling and syncope.  Respiratory:  Negative for cough.   Musculoskeletal:  Positive for arthritis. Negative for muscle weakness.  Gastrointestinal:  Negative for melena.  Neurological:  Positive for dizziness (intermittent).    Objective  Blood pressure 107/65, pulse (!) 106, temperature 98.1 F (36.7 C), temperature source Temporal, resp. rate 17, height '5\' 5"'$  (1.651 m), weight 141 lb 3.2 oz (64 kg), SpO2 96 %.     01/08/2022    2:25 PM 01/08/2022    2:05 PM 01/08/2022    1:45 PM  Vitals with BMI  Height   '5\' 5"'$   Weight   141 lbs 3 oz  BMI   95.6  Systolic 387 564 332  Diastolic 65 45 70  Pulse 951 106 112     Physical Exam Constitutional:      General: She is not in acute distress. Neck:     Vascular: Carotid bruit (bilateral) present. No JVD.  Cardiovascular:  Rate and Rhythm: Normal rate and regular rhythm.     Pulses:          Femoral pulses are 2+ on the right side with bruit and 2+ on the left side with bruit.      Popliteal pulses are 1+ on the right side and 1+ on the left side.        Right dorsalis pedis pulse not accessible and left dorsalis pedis pulse not accessible.        Right posterior tibial pulse not accessible and left posterior tibial pulse not accessible.     Heart sounds: No murmur heard.    No gallop.     Comments: Left arm AV fistula for dialysis noted. Pulmonary:     Effort: Pulmonary effort is normal. No accessory muscle usage.     Breath sounds: Normal breath sounds.  Abdominal:     General: Bowel sounds are normal.     Palpations: Abdomen is soft.   Physical exam unchanged compared to previous office visit.  Laboratory examination:      Latest Ref Rng & Units 08/04/2021    3:46 AM 08/03/2021    4:45 AM 03/13/2021   11:11 AM  CMP  Glucose 70 - 99 mg/dL 129  137  72   BUN 8 - 23 mg/dL '27  18  20   '$ Creatinine 0.44 - 1.00 mg/dL 6.70  5.31  4.50   Sodium 135 - 145 mmol/L 133  132  137   Potassium 3.5 - 5.1 mmol/L 3.3  2.9  4.0   Chloride 98 - 111 mmol/L 94  92  97   CO2 22 - 32 mmol/L 24  27    Calcium 8.9 - 10.3 mg/dL 8.4  9.5        Latest Ref Rng & Units 08/04/2021    3:46 AM 03/13/2021   11:11 AM 01/05/2021    6:16 AM  CBC  WBC 4.0 - 10.5 K/uL 6.4   6.9    Hemoglobin 12.0 - 15.0 g/dL 10.4  13.3  12.1   Hematocrit 36.0 - 46.0 % 31.7  39.0  39.2   Platelets 150 - 400 K/uL 226   268    Lipid Panel     Component Value Date/Time   CHOL 204 (H) 01/04/2020 1049   TRIG 111 01/04/2020 1049   HDL 78 01/04/2020 1049   CHOLHDL 2.1 12/01/2010 0455   VLDL 17 12/01/2010 0455   LDLCALC 107 (H) 01/04/2020 1049   HEMOGLOBIN A1C Lab Results  Component Value Date   HGBA1C 6.4 (H) 08/04/2021   MPG 136.98 08/04/2021   TSH No results for input(s): "TSH" in the last 8760 hours.   External labs:   12/08/2019: H/H 11.2/32. MCV 92. Platelets 269.  Lipid Panel completed 09/01/2018 Cholesterol, total 204.000 m 09/01/2018 Triglycerides 68.000 09/01/2018 HDL 78 MG/DL 09/01/2018 LDL 112.000 m 09/01/2018  Glucose Random 102.000 06/26/2019 BUN 35.000 06/26/2019 Creatinine, Serum 7.650 06/26/2019  A1C 5.600 06/26/2019  TSH 1.240 micr 09/01/2018  FOBT: Normal 09/16/2018   MicroAlbumin Urine 500.000 01/12/2017 MicroAlbumin/Creat 244.9 MG/ 01/12/2017  Allergies   Allergies  Allergen Reactions   Adhesive  [Tape] Hives   Lisinopril Cough   Prednisone Swelling and Other (See Comments)    Excessive fluid buildup      Medications Prior to Visit:   Outpatient Medications Prior to Visit  Medication Sig Dispense Refill   albuterol (PROAIR HFA) 108 (90 Base) MCG/ACT inhaler Inhale 2 puffs into the lungs  every 4 (four) hours as needed for wheezing or shortness of breath. 18 g 1   amLODipine (NORVASC) 10 MG tablet Take 5 mg by mouth at bedtime.     aspirin EC 81 MG tablet Take 81 mg by mouth daily.     atorvastatin (LIPITOR) 80 MG tablet Take 80 mg by mouth at bedtime.     busPIRone (BUSPAR) 7.5 MG tablet Take 7.5 mg by mouth 2 (two) times daily as needed (anxiety).     carvedilol (COREG) 12.5 MG tablet Take 12.5 mg by mouth See admin instructions. Take 1 tablet (12.5 mg) by mouth once daily in the evening on Mondays, Wednesdays, & Fridays. (Dialysis) Take 1  tablet (12.5 mg) by mouth twice daily on Sundays, Tuesdays, Thursdays, & Saturdays. (Non Dialysis)     cinacalcet (SENSIPAR) 90 MG tablet Take 90 mg by mouth See admin instructions. Take 1 tablet (90 mg) by mouth in the evening on Mondays, Wednesdays, & Fridays (dialysis) Take 1 tablet (90 mg) by mouth twice daily on Sundays, Tuesdays, Thursdays, Saturdays. (non dialysis)     Doxercalciferol (HECTOROL IV) Doxercalciferol (Hectorol)     ferric citrate (AURYXIA) 1 GM 210 MG(Fe) tablet Take 210-420 mg by mouth See admin instructions. Take 2 tablets (420 mg) by mouth with each meal & take 1 tablet (210 mg) by mouth with snacks.     fluticasone (FLONASE) 50 MCG/ACT nasal spray Place 2 sprays into both nostrils daily as needed for allergies or rhinitis. 16 g 5   fluticasone-salmeterol (ADVAIR HFA) 115-21 MCG/ACT inhaler Inhale two puffs twice daily 1 each 5   gabapentin (NEURONTIN) 100 MG capsule TAKE 1 CAPSULE (100 MG TOTAL) BY MOUTH AT BEDTIME. WHEN NECESSARY FOR NEUROPATHY PAIN (Patient taking differently: Take 100 mg by mouth at bedtime as needed (pain). When necessary for neuropathy pain) 30 capsule 3   glipiZIDE (GLUCOTROL) 5 MG tablet Take 5 mg by mouth daily before breakfast.     guaiFENesin-codeine 100-10 MG/5ML syrup Take 5 mLs by mouth 3 (three) times daily as needed for cough. 473 mL 0   latanoprost (XALATAN) 0.005 % ophthalmic solution Place 1 drop into the left eye at bedtime. 2.5 mL 5   lidocaine-prilocaine (EMLA) cream Apply 1 application topically 3 (three) times a week. 1-2 hours prior to dialysis  12   loperamide (IMODIUM) 2 MG capsule Take 2 capsules (4 mg total) by mouth 3 (three) times daily as needed for diarrhea or loose stools. 30 capsule 0   Methoxy PEG-Epoetin Beta (MIRCERA IJ) Mircera     montelukast (SINGULAIR) 10 MG tablet TAKE 1 TABLET BY MOUTH EVERYDAY AT BEDTIME 30 tablet 5   omeprazole (PRILOSEC) 20 MG capsule Take 20 mg by mouth daily.      ondansetron (ZOFRAN) 4 MG  tablet Take 1 tablet (4 mg total) by mouth every 6 (six) hours as needed for nausea. 20 tablet 0   polyethylene glycol (MIRALAX / GLYCOLAX) 17 g packet Take 17 g by mouth daily. (Patient taking differently: Take 17 g by mouth daily as needed for mild constipation.) 14 each 0   traZODone (DESYREL) 50 MG tablet Take 50 mg by mouth at bedtime as needed for sleep.     Vitamin D, Ergocalciferol, (DRISDOL) 1.25 MG (50000 UT) CAPS capsule Take 50,000 Units by mouth every Sunday. Takes on Sunday     isosorbide-hydrALAZINE (BIDIL) 20-37.5 MG tablet Take 1 tablet by mouth See admin instructions. hs on Monday,Wednesday,Friday (dialysis days) Tid on Tuesday,Thursday,Saturday,Sunday(non-dialysis days) 270 tablet  3   EPINEPHrine (AUVI-Q) 0.3 mg/0.3 mL IJ SOAJ injection Inject 0.3 mg into the muscle as needed for anaphylaxis. (Patient not taking: Reported on 01/08/2022) 1 each 1   Spacer/Aero-Holding Chambers (AEROCHAMBER PLUS) inhaler Use with inhaler 1 each 2   No facility-administered medications prior to visit.   Final Medications at End of Visit    Current Meds  Medication Sig   albuterol (PROAIR HFA) 108 (90 Base) MCG/ACT inhaler Inhale 2 puffs into the lungs every 4 (four) hours as needed for wheezing or shortness of breath.   amLODipine (NORVASC) 10 MG tablet Take 5 mg by mouth at bedtime.   aspirin EC 81 MG tablet Take 81 mg by mouth daily.   atorvastatin (LIPITOR) 80 MG tablet Take 80 mg by mouth at bedtime.   busPIRone (BUSPAR) 7.5 MG tablet Take 7.5 mg by mouth 2 (two) times daily as needed (anxiety).   carvedilol (COREG) 12.5 MG tablet Take 12.5 mg by mouth See admin instructions. Take 1 tablet (12.5 mg) by mouth once daily in the evening on Mondays, Wednesdays, & Fridays. (Dialysis) Take 1 tablet (12.5 mg) by mouth twice daily on Sundays, Tuesdays, Thursdays, & Saturdays. (Non Dialysis)   cinacalcet (SENSIPAR) 90 MG tablet Take 90 mg by mouth See admin instructions. Take 1 tablet (90 mg) by mouth  in the evening on Mondays, Wednesdays, & Fridays (dialysis) Take 1 tablet (90 mg) by mouth twice daily on Sundays, Tuesdays, Thursdays, Saturdays. (non dialysis)   Doxercalciferol (HECTOROL IV) Doxercalciferol (Hectorol)   ferric citrate (AURYXIA) 1 GM 210 MG(Fe) tablet Take 210-420 mg by mouth See admin instructions. Take 2 tablets (420 mg) by mouth with each meal & take 1 tablet (210 mg) by mouth with snacks.   fluticasone (FLONASE) 50 MCG/ACT nasal spray Place 2 sprays into both nostrils daily as needed for allergies or rhinitis.   fluticasone-salmeterol (ADVAIR HFA) 115-21 MCG/ACT inhaler Inhale two puffs twice daily   gabapentin (NEURONTIN) 100 MG capsule TAKE 1 CAPSULE (100 MG TOTAL) BY MOUTH AT BEDTIME. WHEN NECESSARY FOR NEUROPATHY PAIN (Patient taking differently: Take 100 mg by mouth at bedtime as needed (pain). When necessary for neuropathy pain)   glipiZIDE (GLUCOTROL) 5 MG tablet Take 5 mg by mouth daily before breakfast.   guaiFENesin-codeine 100-10 MG/5ML syrup Take 5 mLs by mouth 3 (three) times daily as needed for cough.   latanoprost (XALATAN) 0.005 % ophthalmic solution Place 1 drop into the left eye at bedtime.   lidocaine-prilocaine (EMLA) cream Apply 1 application topically 3 (three) times a week. 1-2 hours prior to dialysis   loperamide (IMODIUM) 2 MG capsule Take 2 capsules (4 mg total) by mouth 3 (three) times daily as needed for diarrhea or loose stools.   Methoxy PEG-Epoetin Beta (MIRCERA IJ) Mircera   montelukast (SINGULAIR) 10 MG tablet TAKE 1 TABLET BY MOUTH EVERYDAY AT BEDTIME   omeprazole (PRILOSEC) 20 MG capsule Take 20 mg by mouth daily.    ondansetron (ZOFRAN) 4 MG tablet Take 1 tablet (4 mg total) by mouth every 6 (six) hours as needed for nausea.   polyethylene glycol (MIRALAX / GLYCOLAX) 17 g packet Take 17 g by mouth daily. (Patient taking differently: Take 17 g by mouth daily as needed for mild constipation.)   traZODone (DESYREL) 50 MG tablet Take 50 mg by  mouth at bedtime as needed for sleep.   Vitamin D, Ergocalciferol, (DRISDOL) 1.25 MG (50000 UT) CAPS capsule Take 50,000 Units by mouth every Sunday. Takes on Sunday   [  DISCONTINUED] isosorbide-hydrALAZINE (BIDIL) 20-37.5 MG tablet Take 1 tablet by mouth See admin instructions. hs on Monday,Wednesday,Friday (dialysis days) Tid on Tuesday,Thursday,Saturday,Sunday(non-dialysis days)   Radiology:   No results found.  Cardiac Studies:   Echocardiogram 01/21/2021:  Normal LV systolic function with EF 55%. Left ventricle cavity is small.  Moderate concentric hypertrophy of the left ventricle. Normal global wall  motion. Doppler evidence of grade I (impaired) diastolic dysfunction,  elevated LAP. Calculated EF 55%.  No mitral valve regurgitation. Mild mitral valve leaflet calcification. No  evidence of mitral valve stenosis.  Lower Extremity Arterial Duplex 01/10/2019: Right: Patent right femoral - below knee popliteal artery bypass graft without evidence of restenosis.  50-74% stenosis noted in the inflow artery.  Left: Patent right femoral - below knee popliteal artery bypass graft without evidence of restenosis.  50-74% stenosis noted in the inflow artery.   EKG  01/08/2022: Sinus tachycardia at a rate of 107 bpm.  Normal axis.  Left atrial enlargement.  Incomplete right bundle branch block.  Nonspecific ST abnormality.  Unchanged compared to EKG 01/09/2021.  Assessment     ICD-10-CM   1. Hypertensive heart disease with heart failure (HCC)  I11.0 EKG 12-Lead    isosorbide-hydrALAZINE (BIDIL) 20-37.5 MG tablet    2. Chronic diastolic heart failure (HCC)  I50.32 isosorbide-hydrALAZINE (BIDIL) 20-37.5 MG tablet      Recommendations:   Destiny Day  is a 71 y.o. female  Destiny Day female with difficult to control hypertension in the past, uncontrolled diabetes mellitus, hyperlipidemia and end-stage renal disease on hemodialysis since 2017, no CAD by  angiography surprisingly in 2007, peripheral arterial disease with history of left iliac artery stenting and bilateral femoro-popliteal grafting,  s/p left transmetatarsal amputation on 09/01/2016, and right TMA on 12-31-17 by Dr. Sharol Given due to diabetic non healing ulceration, also has diabetic retinopathy.  Patient was last seen in our office 01/09/2021 by Dr. Einar Gip at which time ordered repeat echocardiogram revealing LVEF 55% with moderate LVH and grade 1 diastolic dysfunction.  Patient now presents for 1 year follow up.  Patient is presently fairly asymptomatic from a cardiovascular standpoint and appears euvolemic on exam without evidence of acute heart failure.  Patient's blood pressure is soft and she is complaining of episodes of dizziness/fatigue.  Will reduce amlodipine from 10 mg to 5 mg p.o. daily and continue presents medications otherwise.  Follow-up in 6 weeks, sooner if needed.  Continue to follow with vascular surgery for management of PAD.   Alethia Berthold, PA-C 01/08/2022, 3:33 PM Office: [phone]

## 2022-01-09 DIAGNOSIS — N2581 Secondary hyperparathyroidism of renal origin: Secondary | ICD-10-CM | POA: Diagnosis not present

## 2022-01-09 DIAGNOSIS — Z992 Dependence on renal dialysis: Secondary | ICD-10-CM | POA: Diagnosis not present

## 2022-01-09 DIAGNOSIS — E876 Hypokalemia: Secondary | ICD-10-CM | POA: Diagnosis not present

## 2022-01-09 DIAGNOSIS — N186 End stage renal disease: Secondary | ICD-10-CM | POA: Diagnosis not present

## 2022-01-09 DIAGNOSIS — D509 Iron deficiency anemia, unspecified: Secondary | ICD-10-CM | POA: Diagnosis not present

## 2022-01-09 DIAGNOSIS — D631 Anemia in chronic kidney disease: Secondary | ICD-10-CM | POA: Diagnosis not present

## 2022-01-12 DIAGNOSIS — E876 Hypokalemia: Secondary | ICD-10-CM | POA: Diagnosis not present

## 2022-01-12 DIAGNOSIS — N2581 Secondary hyperparathyroidism of renal origin: Secondary | ICD-10-CM | POA: Diagnosis not present

## 2022-01-12 DIAGNOSIS — N186 End stage renal disease: Secondary | ICD-10-CM | POA: Diagnosis not present

## 2022-01-12 DIAGNOSIS — D631 Anemia in chronic kidney disease: Secondary | ICD-10-CM | POA: Diagnosis not present

## 2022-01-12 DIAGNOSIS — Z992 Dependence on renal dialysis: Secondary | ICD-10-CM | POA: Diagnosis not present

## 2022-01-12 DIAGNOSIS — D509 Iron deficiency anemia, unspecified: Secondary | ICD-10-CM | POA: Diagnosis not present

## 2022-01-14 ENCOUNTER — Other Ambulatory Visit: Payer: Self-pay | Admitting: *Deleted

## 2022-01-14 DIAGNOSIS — I7025 Atherosclerosis of native arteries of other extremities with ulceration: Secondary | ICD-10-CM

## 2022-01-14 DIAGNOSIS — N2581 Secondary hyperparathyroidism of renal origin: Secondary | ICD-10-CM | POA: Diagnosis not present

## 2022-01-14 DIAGNOSIS — D509 Iron deficiency anemia, unspecified: Secondary | ICD-10-CM | POA: Diagnosis not present

## 2022-01-14 DIAGNOSIS — N186 End stage renal disease: Secondary | ICD-10-CM | POA: Diagnosis not present

## 2022-01-14 DIAGNOSIS — D631 Anemia in chronic kidney disease: Secondary | ICD-10-CM | POA: Diagnosis not present

## 2022-01-14 DIAGNOSIS — E876 Hypokalemia: Secondary | ICD-10-CM | POA: Diagnosis not present

## 2022-01-14 DIAGNOSIS — Z95828 Presence of other vascular implants and grafts: Secondary | ICD-10-CM

## 2022-01-14 DIAGNOSIS — Z992 Dependence on renal dialysis: Secondary | ICD-10-CM | POA: Diagnosis not present

## 2022-01-14 DIAGNOSIS — I739 Peripheral vascular disease, unspecified: Secondary | ICD-10-CM

## 2022-01-16 DIAGNOSIS — D631 Anemia in chronic kidney disease: Secondary | ICD-10-CM | POA: Diagnosis not present

## 2022-01-16 DIAGNOSIS — E876 Hypokalemia: Secondary | ICD-10-CM | POA: Diagnosis not present

## 2022-01-16 DIAGNOSIS — Z992 Dependence on renal dialysis: Secondary | ICD-10-CM | POA: Diagnosis not present

## 2022-01-16 DIAGNOSIS — D509 Iron deficiency anemia, unspecified: Secondary | ICD-10-CM | POA: Diagnosis not present

## 2022-01-16 DIAGNOSIS — N186 End stage renal disease: Secondary | ICD-10-CM | POA: Diagnosis not present

## 2022-01-16 DIAGNOSIS — N2581 Secondary hyperparathyroidism of renal origin: Secondary | ICD-10-CM | POA: Diagnosis not present

## 2022-01-19 DIAGNOSIS — Z992 Dependence on renal dialysis: Secondary | ICD-10-CM | POA: Diagnosis not present

## 2022-01-19 DIAGNOSIS — D509 Iron deficiency anemia, unspecified: Secondary | ICD-10-CM | POA: Diagnosis not present

## 2022-01-19 DIAGNOSIS — E876 Hypokalemia: Secondary | ICD-10-CM | POA: Diagnosis not present

## 2022-01-19 DIAGNOSIS — D631 Anemia in chronic kidney disease: Secondary | ICD-10-CM | POA: Diagnosis not present

## 2022-01-19 DIAGNOSIS — N186 End stage renal disease: Secondary | ICD-10-CM | POA: Diagnosis not present

## 2022-01-19 DIAGNOSIS — N2581 Secondary hyperparathyroidism of renal origin: Secondary | ICD-10-CM | POA: Diagnosis not present

## 2022-01-21 DIAGNOSIS — N2581 Secondary hyperparathyroidism of renal origin: Secondary | ICD-10-CM | POA: Diagnosis not present

## 2022-01-21 DIAGNOSIS — E876 Hypokalemia: Secondary | ICD-10-CM | POA: Diagnosis not present

## 2022-01-21 DIAGNOSIS — Z992 Dependence on renal dialysis: Secondary | ICD-10-CM | POA: Diagnosis not present

## 2022-01-21 DIAGNOSIS — D509 Iron deficiency anemia, unspecified: Secondary | ICD-10-CM | POA: Diagnosis not present

## 2022-01-21 DIAGNOSIS — D631 Anemia in chronic kidney disease: Secondary | ICD-10-CM | POA: Diagnosis not present

## 2022-01-21 DIAGNOSIS — N186 End stage renal disease: Secondary | ICD-10-CM | POA: Diagnosis not present

## 2022-01-21 NOTE — Progress Notes (Unsigned)
Office Note     CC:  follow up Requesting Provider:  Cipriano Mile, NP  HPI: Destiny Day is a 71 y.o. (Sep 08, 1950) female who presents for follow up of ESRD with AV graft, PAD and carotid artery stenosis. She has a left AV graft. For a while she was having severe pain during cannulation and therefore underwent revision of left AV graft in February of 2022 by Dr. Stanford Breed. She subsequently was having some overlying skin changes so a TDC was placed in August of 2022 by Dr. Donzetta Matters to allow her arm to heal. She currently is dialyzing on M,W,F using her L arm graft without issues.  She has remote history of left femoral to below-knee popliteal bypass with 72m PTFE by Dr. EDonnetta Hutchingon 07/08/2016. She then had a right femoral to below-knee popliteal bypass with 6 mm ring propatent Gore-Tex graft and right great toe amputation for gangrene by Dr. EDonnetta Hutching1/16/19. She also has bilateral TMA's by orthopedics in 2018 and 2019. These are well healed.  Her carotid artery disease is also followed. Her last duplex showed 1-39% stenosis bilaterally. She has no history of TIA or stroke. Today she reports that she is still dialyzing via her L arm AV graft without issue. She is moving around well with her bilateral orthotics and a walker. She endorses some nerve pain "coming out of her feet" like her "toes are trying to shoot out". However the pain is random and not brought on by anything specific. She has been prescribed gabapentin by Dr.Duda for her nerve pain, however she cannot take the '100mg'$  because it makes her extremely drowsy.  She denies any claudication, rest pain, non healing wounds on the feet. She denies any changes to her vision, weakness or numbness of one side of the body, or slurred speech.  The pt is on a statin for cholesterol management.  The pt is on a daily aspirin.   Other AC:  none The pt is on amlodipine for hypertension.   The pt is diabetic.  Takes glipizide Tobacco hx:  former  Past  Medical History:  Diagnosis Date   Abdominal bruit    Anxiety    Arthritis    Osteoarthritis   Asthma    Cervical disc disease    "pinced nerve"   CHF (congestive heart failure) (HCC)    Diabetes mellitus    Type II   Diverticulitis    ESRD (end stage renal disease) ([facility]    [place]  GERD (gastroesophageal reflux disease)    from medications   GI bleed 03/31/2013   Head injury 07/2017   History of hiatal hernia    Hyperlipidemia    Hypertension    Neuropathy    left leg   Nuclear sclerotic cataract of right eye 04/23/2020   Osteoporosis    Peripheral vascular disease ([facility]    Pneumonia    "very young" and a few years ago   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)    Vitrectomy membrane peel endolaser 05-07-2021   Seasonal allergies    Shortness of breath dyspnea    WIth exertion, when fluid builds   Sleep apnea    can't afford cpap    Vitreomacular traction syndrome, 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   05-07-2021  Vitrectomy, membrane peel, PRP, resection posterior segment of NVD, remnants of peripheral NVE remain in place due to atrophic retina, no vitreous substitute    Past Surgical History:  Procedure Laterality Date   A/V SHUNTOGRAM N/A 09/22/2016   Procedure: A/V Shuntogram - left arm;  Surgeon: Serafina Mitchell, MD;  Location: [place] [facility];  Service: Cardiovascular;  Laterality: N/A;   A/V SHUNTOGRAM N/A 03/22/2018   Procedure: A/V SHUNTOGRAM - left arm;  Surgeon: Serafina Mitchell, MD;  Location: [place] [facility];  Service: Cardiovascular;  Laterality: N/A;   A/V SHUNTOGRAM Left 11/17/2018   Procedure: A/V SHUNTOGRAM;  Surgeon: Angelia Mould, MD;  Location: [place] [facility];  Service: Cardiovascular;  Laterality: Left;   ABDOMINAL HYSTERECTOMY  1993`   AMPUTATION Left 09/01/2016   Procedure: LEFT FOOT TRANSMETATARSAL AMPUTATION;  Surgeon: Newt Minion, MD;  Location: [place];   Service: Orthopedics;  Laterality: Left;   AMPUTATION Right 08/11/2017   Procedure: RIGHT GREAT TOE AMPUTATION DIGIT;  Surgeon: Rosetta Posner, MD;  Location: [place];  Service: Vascular;  Laterality: Right;   AMPUTATION Right 12/31/2017   Procedure: RIGHT TRANSMETATARSAL AMPUTATION;  Surgeon: Newt Minion, MD;  Location: [place];  Service: Orthopedics;  Laterality: Right;   AV FISTULA PLACEMENT Left 04/21/2016   Procedure: INSERTION OF ARTERIOVENOUS (AV) GORE-TEX GRAFT ARM LEFT;  Surgeon: Elam Dutch, MD;  Location: [place];  Service: Vascular;  Laterality: Left;   BASCILIC VEIN TRANSPOSITION Left 07/10/2014   Procedure: BASCILIC VEIN TRANSPOSITION;  Surgeon: Angelia Mould, MD;  Location: [place];  Service: Vascular;  Laterality: Left;   [place] Right 11/08/2014   Procedure: FIRST STAGE BASILIC VEIN TRANSPOSITION;  Surgeon: Angelia Mould, MD;  Location: [place];  Service: Vascular;  Laterality: Right;   [place] Right 01/18/2015   Procedure: SECOND STAGE BASILIC VEIN TRANSPOSITION;  Surgeon: Angelia Mould, MD;  Location: [place];  Service: Vascular;  Laterality: Right;   CRANIOTOMY N/A 08/23/2017   Procedure: CRANIOTOMY HEMATOMA EVACUATION SUBDURAL;  Surgeon: Ashok Pall, MD;  Location: [place];  Service: Neurosurgery;  Laterality: N/A;   CRANIOTOMY Left 08/24/2017   Procedure: CRANIOTOMY FOR RECURRENT ACUTE SUBDURAL HEMATOMA;  Surgeon: Ashok Pall, MD;  Location: [place];  Service: Neurosurgery;  Laterality: Left;   ESOPHAGOGASTRODUODENOSCOPY N/A 03/31/2013   Procedure: ESOPHAGOGASTRODUODENOSCOPY (EGD);  Surgeon: Gatha Mayer, MD;  Location: [facility] ENDOSCOPY;  Service: Endoscopy;  Laterality: N/A;   EYE SURGERY     laser surgery   FEMORAL-POPLITEAL BYPASS GRAFT Left 07/08/2016   Procedure: LEFT  FEMORAL-BELOW KNEE POPLITEAL ARTERY BYPASS GRAFT USING 6MM X 80 CM PROPATEN GORETEX GRAFT WITH RINGS.;  Surgeon: Rosetta Posner, MD;  Location: [facility] OR;   Service: Vascular;  Laterality: Left;   FEMORAL-POPLITEAL BYPASS GRAFT Right 08/11/2017   Procedure: RIGHT FEMORAL TO BELOW KNEE POPLITEAL ARTERKY  BYPASS GRAFT USING 6MM RINGED PROPATEN GRAFT;  Surgeon: Rosetta Posner, MD;  Location: [place];  Service: Vascular;  Laterality: Right;   FISTULOGRAM Left 10/29/2014   Procedure: FISTULOGRAM;  Surgeon: Angelia Mould, MD;  [facility] CATH LAB;  Service: Cardiovascular;  Laterality: Left;   GAS/FLUID EXCHANGE Left 12/20/2018   Procedure: AIR GAS EXCHANGE;  Surgeon: Jalene Mullet, MD;  Location: [place];  Service: Ophthalmology;  Laterality: Left;   INSERTION OF DIALYSIS CATHETER N/A 09/05/2020   Procedure: INSERTION OF DIALYSIS CATHETER;  Surgeon: Cherre Robins, MD;  Location: [place];  Service: Vascular;  Laterality: N/A;   INSERTION OF DIALYSIS CATHETER Left 03/13/2021   Procedure: INSERTION OF DIALYSIS CATHETER;  Surgeon: Waynetta Sandy, MD;  Location: [place];  Service: Vascular;  Laterality: Left;   KNEE ARTHROSCOPY Right 05/06/2018   Procedure: RIGHT KNEE ARTHROSCOPY;  Surgeon: Newt Minion, MD;  Location: [place];  Service: Orthopedics;  Laterality: Right;   KNEE ARTHROSCOPY Right 06/10/2018   Procedure: RIGHT KNEE ARTHROSCOPY AND DEBRIDEMENT;  Surgeon: Newt Minion, MD;  Location: [place];  Service: Orthopedics;  Laterality: Right;   LIGATION OF ARTERIOVENOUS  FISTULA Left 04/21/2016   Procedure: LIGATION OF ARTERIOVENOUS  FISTULA LEFT ARM;  Surgeon: Elam Dutch, MD;  Location: [place];  Service: Vascular;  Laterality: Left;   LOWER EXTREMITY ANGIOGRAPHY N/A 04/06/2017   Procedure: Lower Extremity Angiography - Right;  Surgeon: Serafina Mitchell, MD;  Location: [place] [facility];  Service: Cardiovascular;  Laterality: N/A;   MEMBRANE PEEL Left 12/20/2018   Procedure: MEMBRANE PEEL;  Surgeon: Jalene Mullet, MD;  Location: [place];  Service: Ophthalmology;  Laterality: Left;   PATCH ANGIOPLASTY Right 01/18/2015   Procedure: BASILIC  VEIN PATCH ANGIOPLASTY USING VASCUGUARD PATCH;  Surgeon: Angelia Mould, MD;  Location: [place];  Service: Vascular;  Laterality: Right;   PERIPHERAL VASCULAR BALLOON ANGIOPLASTY  09/22/2016   Procedure: Peripheral Vascular Balloon Angioplasty;  Surgeon: Serafina Mitchell, MD;  Location: [place] [facility];  Service: Cardiovascular;;  Lt. Fistula   PERIPHERAL VASCULAR BALLOON ANGIOPLASTY Left 03/22/2018   Procedure: PERIPHERAL VASCULAR BALLOON ANGIOPLASTY;  Surgeon: Serafina Mitchell, MD;  Location: [place] [facility];  Service: Cardiovascular;  Laterality: Left;  Arm shunt   PERIPHERAL VASCULAR CATHETERIZATION N/A 06/23/2016   Procedure: Abdominal Aortogram w/Lower Extremity;  Surgeon: Serafina Mitchell, MD;  Location: [place] [facility];  Service: Cardiovascular;  Laterality: N/A;   PERIPHERAL VASCULAR CATHETERIZATION  06/23/2016   Procedure: Peripheral Vascular Intervention;  Surgeon: Serafina Mitchell, MD;  Location: [place] [facility];  Service: Cardiovascular;;  lt common and external illiac artery   PHOTOCOAGULATION WITH LASER Left 12/20/2018   Procedure: PHOTOCOAGULATION WITH LASER;  Surgeon: Jalene Mullet, MD;  Location: [place];  Service: Ophthalmology;  Laterality: Left;   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   Procedure: REPAIR OF COMPLEX TRACTION RETINAL DETACHMENT;  Surgeon: Jalene Mullet, MD;  Location: [place];  Service: Ophthalmology;  Laterality: Left;   REVISION OF ARTERIOVENOUS GORETEX GRAFT Left 09/05/2020   Procedure: REVISION OF ARTERIOVENOUS GORETEX GRAFT LEFT;  Surgeon: Cherre Robins, MD;  Location: [facility] OR;  Service: Vascular;  Laterality: Left;    Social History   Socioeconomic History   Marital status: Married    Spouse name: [name]   Number of children: 4   Years of education: some collg   Highest education level: Not on file  Occupational History   Occupation: Retired    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  Tobacco Use    Smoking status: Former    Packs/day: 0.35    Years: 40.00    Total pack years: 14.00    Types: Cigarettes    Quit date: 05/10/2012    Years since quitting: 9.7   Smokeless tobacco: Never  Vaping Use   Vaping Use: Never used  Substance and Sexual Activity   Alcohol use: No   Drug use: No    Comment: marijuana; quit in early 1980's   Sexual activity: Yes  Other Topics Concern   Not on file  Social History Narrative   Patient lives at home with spouse.   Caffeine Use: 1 cup daily   Social Determinants of Health  Financial Resource Strain: Not on file  Food Insecurity: No Food Insecurity (05/30/2020)   Hunger Vital Sign    Worried About Running Out of Food in the Last Year: Never true    Ran Out of Food in the Last Year: Never true  Transportation Needs: No Transportation Needs (05/30/2020)   PRAPARE - Hydrologist (Medical): No    Lack of Transportation (Non-Medical): No  Physical Activity: Not on file  Stress: Not on file  Social Connections: Not on file  Intimate Partner Violence: Not on file    Family History  Problem Relation Age of Onset   Other Mother        not sure of cause of death   Diabetes Father    Diabetes Sister    Diabetes Sister    Pancreatic cancer Maternal Grandmother    Colon cancer Neg Hx     Current Outpatient Medications  Medication Sig Dispense Refill   albuterol (PROAIR HFA) 108 (90 Base) MCG/ACT inhaler Inhale 2 puffs into the lungs every 4 (four) hours as needed for wheezing or shortness of breath. 18 g 1   amLODipine (NORVASC) 10 MG tablet Take 5 mg by mouth at bedtime.     aspirin EC 81 MG tablet Take 81 mg by mouth daily.     atorvastatin (LIPITOR) 80 MG tablet Take 80 mg by mouth at bedtime.     busPIRone (BUSPAR) 7.5 MG tablet Take 7.5 mg by mouth 2 (two) times daily as needed (anxiety).     carvedilol (COREG) 12.5 MG tablet Take 12.5 mg by mouth See admin instructions. Take 1 tablet (12.5 mg) by mouth  once daily in the evening on Mondays, Wednesdays, & Fridays. (Dialysis) Take 1 tablet (12.5 mg) by mouth twice daily on Sundays, Tuesdays, Thursdays, & Saturdays. (Non Dialysis)     cinacalcet (SENSIPAR) 90 MG tablet Take 90 mg by mouth See admin instructions. Take 1 tablet (90 mg) by mouth in the evening on Mondays, Wednesdays, & Fridays (dialysis) Take 1 tablet (90 mg) by mouth twice daily on Sundays, Tuesdays, Thursdays, Saturdays. (non dialysis)     Doxercalciferol (HECTOROL IV) Doxercalciferol (Hectorol)     EPINEPHrine (AUVI-Q) 0.3 mg/0.3 mL IJ SOAJ injection Inject 0.3 mg into the muscle as needed for anaphylaxis. (Patient not taking: Reported on 01/08/2022) 1 each 1   ferric citrate (AURYXIA) 1 GM 210 MG(Fe) tablet Take 210-420 mg by mouth See admin instructions. Take 2 tablets (420 mg) by mouth with each meal & take 1 tablet (210 mg) by mouth with snacks.     fluticasone (FLONASE) 50 MCG/ACT nasal spray Place 2 sprays into both nostrils daily as needed for allergies or rhinitis. 16 g 5   fluticasone-salmeterol (ADVAIR HFA) 115-21 MCG/ACT inhaler Inhale two puffs twice daily 1 each 5   gabapentin (NEURONTIN) 100 MG capsule TAKE 1 CAPSULE (100 MG TOTAL) BY MOUTH AT BEDTIME. WHEN NECESSARY FOR NEUROPATHY PAIN (Patient taking differently: Take 100 mg by mouth at bedtime as needed (pain). When necessary for neuropathy pain) 30 capsule 3   glipiZIDE (GLUCOTROL) 5 MG tablet Take 5 mg by mouth daily before breakfast.     guaiFENesin-codeine 100-10 MG/5ML syrup Take 5 mLs by mouth 3 (three) times daily as needed for cough. 473 mL 0   isosorbide-hydrALAZINE (BIDIL) 20-37.5 MG tablet Take 1 tablet by mouth See admin instructions. hs on Monday,Wednesday,Friday (dialysis days) Tid on Tuesday,Thursday,Saturday,Sunday(non-dialysis days) 270 tablet 3   latanoprost (XALATAN) 0.005 %  ophthalmic solution Place 1 drop into the left eye at bedtime. 2.5 mL 5   lidocaine-prilocaine (EMLA) cream Apply 1 application  topically 3 (three) times a week. 1-2 hours prior to dialysis  12   loperamide (IMODIUM) 2 MG capsule Take 2 capsules (4 mg total) by mouth 3 (three) times daily as needed for diarrhea or loose stools. 30 capsule 0   Methoxy PEG-Epoetin Beta (MIRCERA IJ) Mircera     montelukast (SINGULAIR) 10 MG tablet TAKE 1 TABLET BY MOUTH EVERYDAY AT BEDTIME 30 tablet 5   omeprazole (PRILOSEC) 20 MG capsule Take 20 mg by mouth daily.      ondansetron (ZOFRAN) 4 MG tablet Take 1 tablet (4 mg total) by mouth every 6 (six) hours as needed for nausea. 20 tablet 0   polyethylene glycol (MIRALAX / GLYCOLAX) 17 g packet Take 17 g by mouth daily. (Patient taking differently: Take 17 g by mouth daily as needed for mild constipation.) 14 each 0   Spacer/Aero-Holding Chambers (AEROCHAMBER PLUS) inhaler Use with inhaler 1 each 2   traZODone (DESYREL) 50 MG tablet Take 50 mg by mouth at bedtime as needed for sleep.     Vitamin D, Ergocalciferol, (DRISDOL) 1.25 MG (50000 UT) CAPS capsule Take 50,000 Units by mouth every Sunday. Takes on Sunday     No current facility-administered medications for this visit.    Allergies  Allergen Reactions   Adhesive  [Tape] Hives   Lisinopril Cough   Prednisone Swelling and Other (See Comments)    Excessive fluid buildup     REVIEW OF SYSTEMS:   '[X]'$  denotes positive finding, '[ ]'$  denotes negative finding Cardiac  Comments:  Chest pain or chest pressure:    Shortness of breath upon exertion:    Short of breath when lying flat:    Irregular heart rhythm:        Vascular    Pain in calf, thigh, or hip brought on by ambulation:    Pain in feet at night that wakes you up from your sleep:     Blood clot in your veins:    Leg swelling:         Pulmonary    Oxygen at home:    Productive cough:     Wheezing:         Neurologic    Sudden weakness in arms or legs:     Sudden numbness in arms or legs:     Sudden onset of difficulty speaking or slurred speech:    Temporary  loss of vision in one eye:     Problems with dizziness:         Gastrointestinal    Blood in stool:     Vomited blood:         Genitourinary    Burning when urinating:     Blood in urine:        Psychiatric    Major depression:         Hematologic    Bleeding problems:    Problems with blood clotting too easily:        Skin    Rashes or ulcers:        Constitutional    Fever or chills:      PHYSICAL EXAMINATION:  There were no vitals filed for this visit.  General:  WDWN in NAD; vital signs documented above Gait: Not observed HENT: WNL, normocephalic Pulmonary: normal non-labored breathing , without rales, rhonchi, or wheezing Cardiac: regular HR,  without murmurs without carotid bruit Abdomen: soft, NT, no masses Skin: without rashes Vascular Exam/Pulses: Palpable femoral pulses 2+. R peroneal 2+. No palpable pedal pulses of L foot, however it is warm and well perfused Extremities: without ischemic changes, without Gangrene , without cellulitis; without open wounds; moves all extremities well Musculoskeletal: no muscle wasting or atrophy  Neurologic: A&O X 3;  No focal weakness or paresthesias are detected Psychiatric:  The pt has Normal affect.   Non-Invasive Vascular Imaging:   Bilateral ABI  ABI Findings:  +---------+------------------+-----+----------+----------------+  Right    Rt Pressure (mmHg)IndexWaveform  Comment           +---------+------------------+-----+----------+----------------+  Radial   176                                                +---------+------------------+-----+----------+----------------+  PTA                             monophasicnon-compressible  +---------+------------------+-----+----------+----------------+  DP                              monophasicnon-compressible  +---------+------------------+-----+----------+----------------+  Great Toe                                 amputation         +---------+------------------+-----+----------+----------------+   +---------+------------------+-----+----------+----------+  Left     Lt Pressure (mmHg)IndexWaveform  Comment     +---------+------------------+-----+----------+----------+  Brachial                                  AVF         +---------+------------------+-----+----------+----------+  PTA      147               0.84 monophasic            +---------+------------------+-----+----------+----------+  DP       114               0.65 monophasic            +---------+------------------+-----+----------+----------+  Great Toe                                 amputation  +---------+------------------+-----+----------+----------+   +-------+-----------+-----------+------------+------------+  ABI/TBIToday's ABIToday's TBIPrevious ABIPrevious TBI  +-------+-----------+-----------+------------+------------+  Right  [place]         amputation 1.02                      +-------+-----------+-----------+------------+------------+  Left   0.84       amputation 1.20                      +-------+-----------+-----------+------------+------------+   Bilateral Bypass Duplex  Right Graft #1: fem-pop  +------------------+--------+---------------+----------+--------+                    PSV cm/sStenosis       Waveform  Comments  +------------------+--------+---------------+----------+--------+  Inflow            340     50-74% stenosisbiphasic            +------------------+--------+---------------+----------+--------+  Prox Anastomosis  63                     monophasic          +------------------+--------+---------------+----------+--------+  Proximal Graft    56                     monophasic          +------------------+--------+---------------+----------+--------+  Mid Graft         25                     monophasic           +------------------+--------+---------------+----------+--------+  Distal Graft      26                     monophasic          +------------------+--------+---------------+----------+--------+  Distal Anastomosis37                     monophasic          +------------------+--------+---------------+----------+--------+  Outflow           29                     monophasic          +------------------+--------+---------------+----------+--------+    Left Graft #1: fem-pop  +--------------------+--------+--------+--------+--------+                      PSV cm/sStenosisWaveformComments  +--------------------+--------+--------+--------+--------+  Inflow              159             biphasic          +--------------------+--------+--------+--------+--------+  Proximal Anastomosis153             biphasic          +--------------------+--------+--------+--------+--------+  Proximal Graft      47              biphasic          +--------------------+--------+--------+--------+--------+  Mid Graft           47              biphasic          +--------------------+--------+--------+--------+--------+  Distal Graft        47              biphasic          +--------------------+--------+--------+--------+--------+  Distal Anastomosis  54              biphasic          +--------------------+--------+--------+--------+--------+  Outflow             50              biphasic          +--------------------+--------+--------+--------+--------+  ASSESSMENT/PLAN:: 71 y.o. female here for follow up for bilateral duplex studies of femoral-popliteal bypass grafts and bilateral ABIs in the setting of PAD   -The patient has good mobility and does not have any symptoms of claudication or rest pain -She has palpable femoral pulses and well perfused lower extremities. She has a palpable 2+ R peroneal pulse -Her right ABI cannot be created due to  non-compressible arteries in the lower leg. Her femoral-popliteal bypass is patent and shows evidence of 50-74% inflow stenosis, however this is the  same as it was in 02/06/2020. Her RLE is well perfused with no increase in pain since her last visit. -Her left ABI has decreased to 0.84 from 1.2 in 2021, however her femoral-popliteal bypass is patent with good flow. Her LLE is well perfused with no increased pain since her last visit. -Continue aspirin and statin -Follow up in 1 year for bilateral bypass duplex studies and ABIs  Vicente Serene, PA-C [facility] [phone]  Clinic MD:   [place]

## 2022-01-22 ENCOUNTER — Ambulatory Visit (INDEPENDENT_AMBULATORY_CARE_PROVIDER_SITE_OTHER): Payer: Medicare Other | Admitting: Physician Assistant

## 2022-01-22 ENCOUNTER — Ambulatory Visit (HOSPITAL_COMMUNITY)
Admission: RE | Admit: 2022-01-22 | Discharge: 2022-01-22 | Disposition: A | Discharge status: Home / Self Care | Payer: Medicare Other | Source: Ambulatory Visit | Attending: Vascular Surgery | Admitting: Vascular Surgery

## 2022-01-22 ENCOUNTER — Ambulatory Visit (INDEPENDENT_AMBULATORY_CARE_PROVIDER_SITE_OTHER)
Admission: RE | Admit: 2022-01-22 | Discharge: 2022-01-22 | Disposition: A | Discharge status: Home / Self Care | Payer: Medicare Other | Source: Ambulatory Visit | Attending: Vascular Surgery | Admitting: Vascular Surgery

## 2022-01-22 VITALS — BP 167/74 | HR 85 | Temp 97.2°F | Resp 14 | Ht 65.0 in | Wt 139.4 lb

## 2022-01-22 DIAGNOSIS — Z95828 Presence of other vascular implants and grafts: Secondary | ICD-10-CM

## 2022-01-22 DIAGNOSIS — Z992 Dependence on renal dialysis: Secondary | ICD-10-CM | POA: Diagnosis not present

## 2022-01-22 DIAGNOSIS — I739 Peripheral vascular disease, unspecified: Secondary | ICD-10-CM | POA: Insufficient documentation

## 2022-01-22 DIAGNOSIS — I7025 Atherosclerosis of native arteries of other extremities with ulceration: Secondary | ICD-10-CM | POA: Insufficient documentation

## 2022-01-22 DIAGNOSIS — N186 End stage renal disease: Secondary | ICD-10-CM

## 2022-01-23 DIAGNOSIS — N186 End stage renal disease: Secondary | ICD-10-CM | POA: Diagnosis not present

## 2022-01-23 DIAGNOSIS — Z992 Dependence on renal dialysis: Secondary | ICD-10-CM | POA: Diagnosis not present

## 2022-01-23 DIAGNOSIS — D509 Iron deficiency anemia, unspecified: Secondary | ICD-10-CM | POA: Diagnosis not present

## 2022-01-23 DIAGNOSIS — N2581 Secondary hyperparathyroidism of renal origin: Secondary | ICD-10-CM | POA: Diagnosis not present

## 2022-01-23 DIAGNOSIS — E876 Hypokalemia: Secondary | ICD-10-CM | POA: Diagnosis not present

## 2022-01-23 DIAGNOSIS — D631 Anemia in chronic kidney disease: Secondary | ICD-10-CM | POA: Diagnosis not present

## 2022-01-24 DIAGNOSIS — N186 End stage renal disease: Secondary | ICD-10-CM | POA: Diagnosis not present

## 2022-01-24 DIAGNOSIS — Z992 Dependence on renal dialysis: Secondary | ICD-10-CM | POA: Diagnosis not present

## 2022-01-24 DIAGNOSIS — I159 Secondary hypertension, unspecified: Secondary | ICD-10-CM | POA: Diagnosis not present

## 2022-01-26 DIAGNOSIS — N186 End stage renal disease: Secondary | ICD-10-CM | POA: Diagnosis not present

## 2022-01-26 DIAGNOSIS — E876 Hypokalemia: Secondary | ICD-10-CM | POA: Diagnosis not present

## 2022-01-26 DIAGNOSIS — Z992 Dependence on renal dialysis: Secondary | ICD-10-CM | POA: Diagnosis not present

## 2022-01-26 DIAGNOSIS — N2581 Secondary hyperparathyroidism of renal origin: Secondary | ICD-10-CM | POA: Diagnosis not present

## 2022-01-26 DIAGNOSIS — D631 Anemia in chronic kidney disease: Secondary | ICD-10-CM | POA: Diagnosis not present

## 2022-01-26 DIAGNOSIS — D509 Iron deficiency anemia, unspecified: Secondary | ICD-10-CM | POA: Diagnosis not present

## 2022-01-28 DIAGNOSIS — D509 Iron deficiency anemia, unspecified: Secondary | ICD-10-CM | POA: Diagnosis not present

## 2022-01-28 DIAGNOSIS — Z992 Dependence on renal dialysis: Secondary | ICD-10-CM | POA: Diagnosis not present

## 2022-01-28 DIAGNOSIS — D631 Anemia in chronic kidney disease: Secondary | ICD-10-CM | POA: Diagnosis not present

## 2022-01-28 DIAGNOSIS — N2581 Secondary hyperparathyroidism of renal origin: Secondary | ICD-10-CM | POA: Diagnosis not present

## 2022-01-28 DIAGNOSIS — N186 End stage renal disease: Secondary | ICD-10-CM | POA: Diagnosis not present

## 2022-01-28 DIAGNOSIS — E876 Hypokalemia: Secondary | ICD-10-CM | POA: Diagnosis not present

## 2022-01-30 DIAGNOSIS — D631 Anemia in chronic kidney disease: Secondary | ICD-10-CM | POA: Diagnosis not present

## 2022-01-30 DIAGNOSIS — E876 Hypokalemia: Secondary | ICD-10-CM | POA: Diagnosis not present

## 2022-01-30 DIAGNOSIS — D509 Iron deficiency anemia, unspecified: Secondary | ICD-10-CM | POA: Diagnosis not present

## 2022-01-30 DIAGNOSIS — N2581 Secondary hyperparathyroidism of renal origin: Secondary | ICD-10-CM | POA: Diagnosis not present

## 2022-01-30 DIAGNOSIS — N186 End stage renal disease: Secondary | ICD-10-CM | POA: Diagnosis not present

## 2022-01-30 DIAGNOSIS — Z992 Dependence on renal dialysis: Secondary | ICD-10-CM | POA: Diagnosis not present

## 2022-02-02 DIAGNOSIS — N2581 Secondary hyperparathyroidism of renal origin: Secondary | ICD-10-CM | POA: Diagnosis not present

## 2022-02-02 DIAGNOSIS — E1122 Type 2 diabetes mellitus with diabetic chronic kidney disease: Secondary | ICD-10-CM | POA: Diagnosis not present

## 2022-02-02 DIAGNOSIS — E876 Hypokalemia: Secondary | ICD-10-CM | POA: Diagnosis not present

## 2022-02-02 DIAGNOSIS — Z992 Dependence on renal dialysis: Secondary | ICD-10-CM | POA: Diagnosis not present

## 2022-02-02 DIAGNOSIS — D509 Iron deficiency anemia, unspecified: Secondary | ICD-10-CM | POA: Diagnosis not present

## 2022-02-02 DIAGNOSIS — N186 End stage renal disease: Secondary | ICD-10-CM | POA: Diagnosis not present

## 2022-02-02 DIAGNOSIS — D631 Anemia in chronic kidney disease: Secondary | ICD-10-CM | POA: Diagnosis not present

## 2022-02-03 ENCOUNTER — Ambulatory Visit (INDEPENDENT_AMBULATORY_CARE_PROVIDER_SITE_OTHER): Payer: Medicare Other

## 2022-02-03 DIAGNOSIS — J309 Allergic rhinitis, unspecified: Secondary | ICD-10-CM | POA: Diagnosis not present

## 2022-02-04 DIAGNOSIS — D509 Iron deficiency anemia, unspecified: Secondary | ICD-10-CM | POA: Diagnosis not present

## 2022-02-04 DIAGNOSIS — E876 Hypokalemia: Secondary | ICD-10-CM | POA: Diagnosis not present

## 2022-02-04 DIAGNOSIS — N2581 Secondary hyperparathyroidism of renal origin: Secondary | ICD-10-CM | POA: Diagnosis not present

## 2022-02-04 DIAGNOSIS — Z992 Dependence on renal dialysis: Secondary | ICD-10-CM | POA: Diagnosis not present

## 2022-02-04 DIAGNOSIS — N186 End stage renal disease: Secondary | ICD-10-CM | POA: Diagnosis not present

## 2022-02-04 DIAGNOSIS — D631 Anemia in chronic kidney disease: Secondary | ICD-10-CM | POA: Diagnosis not present

## 2022-02-06 DIAGNOSIS — N2581 Secondary hyperparathyroidism of renal origin: Secondary | ICD-10-CM | POA: Diagnosis not present

## 2022-02-06 DIAGNOSIS — Z992 Dependence on renal dialysis: Secondary | ICD-10-CM | POA: Diagnosis not present

## 2022-02-06 DIAGNOSIS — D631 Anemia in chronic kidney disease: Secondary | ICD-10-CM | POA: Diagnosis not present

## 2022-02-06 DIAGNOSIS — N186 End stage renal disease: Secondary | ICD-10-CM | POA: Diagnosis not present

## 2022-02-06 DIAGNOSIS — D509 Iron deficiency anemia, unspecified: Secondary | ICD-10-CM | POA: Diagnosis not present

## 2022-02-06 DIAGNOSIS — E876 Hypokalemia: Secondary | ICD-10-CM | POA: Diagnosis not present

## 2022-02-09 DIAGNOSIS — N2581 Secondary hyperparathyroidism of renal origin: Secondary | ICD-10-CM | POA: Diagnosis not present

## 2022-02-09 DIAGNOSIS — D631 Anemia in chronic kidney disease: Secondary | ICD-10-CM | POA: Diagnosis not present

## 2022-02-09 DIAGNOSIS — N186 End stage renal disease: Secondary | ICD-10-CM | POA: Diagnosis not present

## 2022-02-09 DIAGNOSIS — E876 Hypokalemia: Secondary | ICD-10-CM | POA: Diagnosis not present

## 2022-02-09 DIAGNOSIS — D509 Iron deficiency anemia, unspecified: Secondary | ICD-10-CM | POA: Diagnosis not present

## 2022-02-09 DIAGNOSIS — Z992 Dependence on renal dialysis: Secondary | ICD-10-CM | POA: Diagnosis not present

## 2022-02-10 ENCOUNTER — Other Ambulatory Visit: Payer: Self-pay | Admitting: Student

## 2022-02-10 ENCOUNTER — Ambulatory Visit
Admission: RE | Admit: 2022-02-10 | Discharge: 2022-02-10 | Disposition: A | Discharge status: Home / Self Care | Payer: Medicare Other | Source: Ambulatory Visit | Attending: Student | Admitting: Student

## 2022-02-10 ENCOUNTER — Ambulatory Visit: Payer: Medicare Other | Admitting: Student

## 2022-02-10 ENCOUNTER — Ambulatory Visit (INDEPENDENT_AMBULATORY_CARE_PROVIDER_SITE_OTHER): Payer: Medicare Other

## 2022-02-10 DIAGNOSIS — F17211 Nicotine dependence, cigarettes, in remission: Secondary | ICD-10-CM | POA: Diagnosis not present

## 2022-02-10 DIAGNOSIS — N186 End stage renal disease: Secondary | ICD-10-CM | POA: Diagnosis not present

## 2022-02-10 DIAGNOSIS — J309 Allergic rhinitis, unspecified: Secondary | ICD-10-CM | POA: Diagnosis not present

## 2022-02-10 DIAGNOSIS — J449 Chronic obstructive pulmonary disease, unspecified: Secondary | ICD-10-CM

## 2022-02-10 DIAGNOSIS — Z0000 Encounter for general adult medical examination without abnormal findings: Secondary | ICD-10-CM | POA: Diagnosis not present

## 2022-02-10 DIAGNOSIS — Z79899 Other long term (current) drug therapy: Secondary | ICD-10-CM | POA: Diagnosis not present

## 2022-02-10 DIAGNOSIS — I132 Hypertensive heart and chronic kidney disease with heart failure and with stage 5 chronic kidney disease, or end stage renal disease: Secondary | ICD-10-CM | POA: Diagnosis not present

## 2022-02-10 DIAGNOSIS — E1122 Type 2 diabetes mellitus with diabetic chronic kidney disease: Secondary | ICD-10-CM | POA: Diagnosis not present

## 2022-02-10 DIAGNOSIS — R053 Chronic cough: Secondary | ICD-10-CM | POA: Diagnosis not present

## 2022-02-10 DIAGNOSIS — Z992 Dependence on renal dialysis: Secondary | ICD-10-CM | POA: Diagnosis not present

## 2022-02-11 DIAGNOSIS — D509 Iron deficiency anemia, unspecified: Secondary | ICD-10-CM | POA: Diagnosis not present

## 2022-02-11 DIAGNOSIS — D631 Anemia in chronic kidney disease: Secondary | ICD-10-CM | POA: Diagnosis not present

## 2022-02-11 DIAGNOSIS — Z992 Dependence on renal dialysis: Secondary | ICD-10-CM | POA: Diagnosis not present

## 2022-02-11 DIAGNOSIS — N186 End stage renal disease: Secondary | ICD-10-CM | POA: Diagnosis not present

## 2022-02-11 DIAGNOSIS — E876 Hypokalemia: Secondary | ICD-10-CM | POA: Diagnosis not present

## 2022-02-11 DIAGNOSIS — N2581 Secondary hyperparathyroidism of renal origin: Secondary | ICD-10-CM | POA: Diagnosis not present

## 2022-02-12 ENCOUNTER — Encounter (INDEPENDENT_AMBULATORY_CARE_PROVIDER_SITE_OTHER): Payer: Self-pay | Admitting: Ophthalmology

## 2022-02-12 ENCOUNTER — Ambulatory Visit (INDEPENDENT_AMBULATORY_CARE_PROVIDER_SITE_OTHER): Payer: Medicare Other | Admitting: Ophthalmology

## 2022-02-12 DIAGNOSIS — H35371 Puckering of macula, right eye: Secondary | ICD-10-CM | POA: Diagnosis not present

## 2022-02-12 DIAGNOSIS — E113511 Type 2 diabetes mellitus with proliferative diabetic retinopathy with macular edema, right eye: Secondary | ICD-10-CM

## 2022-02-12 DIAGNOSIS — E113512 Type 2 diabetes mellitus with proliferative diabetic retinopathy with macular edema, left eye: Secondary | ICD-10-CM

## 2022-02-12 DIAGNOSIS — I7025 Atherosclerosis of native arteries of other extremities with ulceration: Secondary | ICD-10-CM | POA: Diagnosis not present

## 2022-02-12 MED ORDER — AFLIBERCEPT 2MG/0.05ML IZ SOLN FOR KALEIDOSCOPE
2.0000 mg | INTRAVITREAL | Status: AC | PRN
  Administered 2022-02-12: 2 mg via INTRAVITREAL

## 2022-02-12 NOTE — Assessment & Plan Note (Addendum)
Visual acuity OD now affected by severe epiretinal membrane over the fovea.  Will likely need to schedule vitrectomy membrane peel, at [facility] at some point in the future

## 2022-02-12 NOTE — Assessment & Plan Note (Signed)
OD relatively quiescent and no real active macular edema just secondary thickening from ERM

## 2022-02-12 NOTE — Assessment & Plan Note (Signed)
Massive CSME now improving on intravitreal Eylea.  Repeat injection today and maintain current interval of follow-up of 5 to 6 weeks

## 2022-02-12 NOTE — Progress Notes (Signed)
02/12/2022     CHIEF COMPLAINT Patient presents for  Chief Complaint  Patient presents with   Diabetic Retinopathy with Macular Edema      HISTORY OF PRESENT ILLNESS: Destiny Day is a 71 y.o. female who presents to the clinic today for:   HPI   6 weeks for DILATE OU, EYLEA, OCT, OS. Pt stated her vision has improved since last visit. Pt is wondering if she have her drops refilled.  Last edited by Silvestre Moment on 02/12/2022 10:23 AM.      Referring physician: Cipriano Mile, NP [place],  [place] [address]  HISTORICAL INFORMATION:   Selected notes from the MEDICAL RECORD NUMBER    Lab Results  Component Value Date   HGBA1C 6.4 (H) 08/04/2021     CURRENT MEDICATIONS: Current Outpatient Medications (Ophthalmic Drugs)  Medication Sig   latanoprost (XALATAN) 0.005 % ophthalmic solution Place 1 drop into the left eye at bedtime.   No current facility-administered medications for this visit. (Ophthalmic Drugs)   Current Outpatient Medications (Other)  Medication Sig   albuterol (PROAIR HFA) 108 (90 Base) MCG/ACT inhaler Inhale 2 puffs into the lungs every 4 (four) hours as needed for wheezing or shortness of breath.   amLODipine (NORVASC) 10 MG tablet Take 5 mg by mouth at bedtime.   aspirin EC 81 MG tablet Take 81 mg by mouth daily.   atorvastatin (LIPITOR) 80 MG tablet Take 80 mg by mouth at bedtime.   busPIRone (BUSPAR) 7.5 MG tablet Take 7.5 mg by mouth 2 (two) times daily as needed (anxiety).   carvedilol (COREG) 12.5 MG tablet Take 12.5 mg by mouth See admin instructions. Take 1 tablet (12.5 mg) by mouth once daily in the evening on Mondays, Wednesdays, & Fridays. (Dialysis) Take 1 tablet (12.5 mg) by mouth twice daily on Sundays, Tuesdays, Thursdays, & Saturdays. (Non Dialysis)   cinacalcet (SENSIPAR) 90 MG tablet Take 90 mg by mouth See admin instructions. Take 1 tablet (90 mg) by mouth in the evening on Mondays, Wednesdays, & Fridays  (dialysis) Take 1 tablet (90 mg) by mouth twice daily on Sundays, Tuesdays, Thursdays, Saturdays. (non dialysis)   Doxercalciferol (HECTOROL IV) Doxercalciferol (Hectorol)   EPINEPHrine (AUVI-Q) 0.3 mg/0.3 mL IJ SOAJ injection Inject 0.3 mg into the muscle as needed for anaphylaxis.   ferric citrate (AURYXIA) 1 GM 210 MG(Fe) tablet Take 210-420 mg by mouth See admin instructions. Take 2 tablets (420 mg) by mouth with each meal & take 1 tablet (210 mg) by mouth with snacks.   fluticasone (FLONASE) 50 MCG/ACT nasal spray Place 2 sprays into both nostrils daily as needed for allergies or rhinitis.   fluticasone-salmeterol (ADVAIR HFA) 115-21 MCG/ACT inhaler Inhale two puffs twice daily   gabapentin (NEURONTIN) 100 MG capsule TAKE 1 CAPSULE (100 MG TOTAL) BY MOUTH AT BEDTIME. WHEN NECESSARY FOR NEUROPATHY PAIN (Patient taking differently: Take 100 mg by mouth at bedtime as needed (pain). When necessary for neuropathy pain)   glipiZIDE (GLUCOTROL) 5 MG tablet Take 5 mg by mouth daily before breakfast.   guaiFENesin-codeine 100-10 MG/5ML syrup Take 5 mLs by mouth 3 (three) times daily as needed for cough.   isosorbide-hydrALAZINE (BIDIL) 20-37.5 MG tablet Take 1 tablet by mouth See admin instructions. hs on Monday,Wednesday,Friday (dialysis days) Tid on Tuesday,Thursday,Saturday,Sunday(non-dialysis days)   lidocaine-prilocaine (EMLA) cream Apply 1 application topically 3 (three) times a week. 1-2 hours prior to dialysis   loperamide (IMODIUM) 2 MG capsule Take 2 capsules (4 mg  total) by mouth 3 (three) times daily as needed for diarrhea or loose stools.   Methoxy PEG-Epoetin Beta (MIRCERA IJ) Mircera   montelukast (SINGULAIR) 10 MG tablet TAKE 1 TABLET BY MOUTH EVERYDAY AT BEDTIME   omeprazole (PRILOSEC) 20 MG capsule Take 20 mg by mouth daily.    ondansetron (ZOFRAN) 4 MG tablet Take 1 tablet (4 mg total) by mouth every 6 (six) hours as needed for nausea.   polyethylene glycol (MIRALAX / GLYCOLAX) 17 g  packet Take 17 g by mouth daily. (Patient taking differently: Take 17 g by mouth daily as needed for mild constipation.)   Spacer/Aero-Holding Chambers (AEROCHAMBER PLUS) inhaler Use with inhaler   traZODone (DESYREL) 50 MG tablet Take 50 mg by mouth at bedtime as needed for sleep.   Vitamin D, Ergocalciferol, (DRISDOL) 1.25 MG (50000 UT) CAPS capsule Take 50,000 Units by mouth every Sunday. Takes on Sunday   No current facility-administered medications for this visit. (Other)      REVIEW OF SYSTEMS: ROS   Negative for: Constitutional, Gastrointestinal, Neurological, Skin, Genitourinary, Musculoskeletal, HENT, Endocrine, Cardiovascular, Eyes, Respiratory, Psychiatric, Allergic/Imm, Heme/Lymph Last edited by Silvestre Moment on 02/12/2022 10:23 AM.       ALLERGIES Allergies  Allergen Reactions   Adhesive  [Tape] Hives   Lisinopril Cough   Prednisone Swelling and Other (See Comments)    Excessive fluid buildup    PAST MEDICAL HISTORY Past Medical History:  Diagnosis Date   Abdominal bruit    Anxiety    Arthritis    Osteoarthritis   Asthma    Cervical disc disease    "pinced nerve"   CHF (congestive heart failure) (HCC)    Diabetes mellitus    Type II   Diverticulitis    ESRD (end stage renal disease) ([place])    dialysis - M/W/F- [place]   GERD (gastroesophageal reflux disease)    from medications   GI bleed 03/31/2013   Head injury 07/2017   History of hiatal hernia    Hyperlipidemia    Hypertension    Neuropathy    left leg   Nuclear sclerotic cataract of right eye 04/23/2020   Osteoporosis    Peripheral vascular disease ([place])    Pneumonia    "very young" and a few years ago   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)    Vitrectomy membrane peel endolaser 05-07-2021   Seasonal allergies    Shortness of breath dyspnea    WIth exertion, when fluid builds   Sleep apnea    can't afford cpap     Vitreomacular traction syndrome, 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   05-07-2021  Vitrectomy, membrane peel, PRP, resection posterior segment of NVD, remnants of peripheral NVE remain in place due to atrophic retina, no vitreous substitute   Past Surgical History:  Procedure Laterality Date   A/V SHUNTOGRAM N/A 09/22/2016   Procedure: A/V Shuntogram - left arm;  Surgeon: Serafina Mitchell, MD;  Location: [place] [facility];  Service: Cardiovascular;  Laterality: N/A;   A/V SHUNTOGRAM N/A 03/22/2018   Procedure: A/V SHUNTOGRAM - left arm;  Surgeon: Serafina Mitchell, MD;  Location: [place] [facility];  Service: Cardiovascular;  Laterality: N/A;   A/V SHUNTOGRAM Left 11/17/2018   Procedure: A/V SHUNTOGRAM;  Surgeon: Angelia Mould, MD;  Location: [place] [facility];  Service: Cardiovascular;  Laterality: Left;   ABDOMINAL HYSTERECTOMY  1993`   AMPUTATION Left 09/01/2016   Procedure: LEFT FOOT TRANSMETATARSAL AMPUTATION;  Surgeon: Newt Minion, MD;  Location: [place];  Service: Orthopedics;  Laterality: Left;   AMPUTATION Right 08/11/2017   Procedure: RIGHT GREAT TOE AMPUTATION DIGIT;  Surgeon: Rosetta Posner, MD;  Location: [place];  Service: Vascular;  Laterality: Right;   AMPUTATION Right 12/31/2017   Procedure: RIGHT TRANSMETATARSAL AMPUTATION;  Surgeon: Newt Minion, MD;  Location: [place];  Service: Orthopedics;  Laterality: Right;   AV FISTULA PLACEMENT Left 04/21/2016   Procedure: INSERTION OF ARTERIOVENOUS (AV) GORE-TEX GRAFT ARM LEFT;  Surgeon: Elam Dutch, MD;  Location: [place];  Service: Vascular;  Laterality: Left;   BASCILIC VEIN TRANSPOSITION Left 07/10/2014   Procedure: BASCILIC VEIN TRANSPOSITION;  Surgeon: Angelia Mould, MD;  Location: [place];  Service: Vascular;  Laterality: Left;   [place] Right 11/08/2014   Procedure: FIRST STAGE BASILIC VEIN TRANSPOSITION;  Surgeon: Angelia Mould, MD;  Location: [place];  Service: Vascular;  Laterality: Right;   [place] Right 01/18/2015   Procedure: SECOND STAGE BASILIC VEIN TRANSPOSITION;  Surgeon: Angelia Mould, MD;  Location: [place];  Service: Vascular;  Laterality: Right;   CRANIOTOMY N/A 08/23/2017   Procedure: CRANIOTOMY HEMATOMA EVACUATION SUBDURAL;  Surgeon: Ashok Pall, MD;  Location: [place];  Service: Neurosurgery;  Laterality: N/A;   CRANIOTOMY Left 08/24/2017   Procedure: CRANIOTOMY FOR RECURRENT ACUTE SUBDURAL HEMATOMA;  Surgeon: Ashok Pall, MD;  Location: [place];  Service: Neurosurgery;  Laterality: Left;   ESOPHAGOGASTRODUODENOSCOPY N/A 03/31/2013   Procedure: ESOPHAGOGASTRODUODENOSCOPY (EGD);  Surgeon: Gatha Mayer, MD;  Location: [facility] ENDOSCOPY;  Service: Endoscopy;  Laterality: N/A;   EYE SURGERY     laser surgery   FEMORAL-POPLITEAL BYPASS GRAFT Left 07/08/2016   Procedure: LEFT  FEMORAL-BELOW KNEE POPLITEAL ARTERY BYPASS GRAFT USING 6MM X 80 CM PROPATEN GORETEX GRAFT WITH RINGS.;  Surgeon: Rosetta Posner, MD;  Location: [facility] OR;  Service: Vascular;  Laterality: Left;   FEMORAL-POPLITEAL BYPASS GRAFT Right 08/11/2017   Procedure: RIGHT FEMORAL TO BELOW KNEE POPLITEAL ARTERKY  BYPASS GRAFT USING 6MM RINGED PROPATEN GRAFT;  Surgeon: Rosetta Posner, MD;  Location: [place];  Service: Vascular;  Laterality: Right;   FISTULOGRAM Left 10/29/2014   Procedure: FISTULOGRAM;  Surgeon: Angelia Mould, MD;  Location: [facility] CATH LAB;  Service: Cardiovascular;  Laterality: Left;   GAS/FLUID EXCHANGE Left 12/20/2018   Procedure: AIR GAS EXCHANGE;  Surgeon: Jalene Mullet, MD;  Location: [place];  Service: Ophthalmology;  Laterality: Left;   INSERTION OF DIALYSIS CATHETER N/A 09/05/2020   Procedure: INSERTION OF DIALYSIS CATHETER;  Surgeon: Cherre Robins, MD;  Location: [place];  Service: Vascular;  Laterality: N/A;   INSERTION OF DIALYSIS CATHETER Left 03/13/2021   Procedure: INSERTION OF DIALYSIS CATHETER;  Surgeon: Waynetta Sandy, MD;  Location: [place];  Service: Vascular;  Laterality:  Left;   KNEE ARTHROSCOPY Right 05/06/2018   Procedure: RIGHT KNEE ARTHROSCOPY;  Surgeon: Newt Minion, MD;  Location: [place];  Service: Orthopedics;  Laterality: Right;   KNEE ARTHROSCOPY Right 06/10/2018   Procedure: RIGHT KNEE ARTHROSCOPY AND DEBRIDEMENT;  Surgeon: Newt Minion, MD;  Location: [place];  Service: Orthopedics;  Laterality: Right;   LIGATION OF ARTERIOVENOUS  FISTULA Left 04/21/2016   Procedure: LIGATION OF ARTERIOVENOUS  FISTULA LEFT ARM;  Surgeon: Elam Dutch, MD;  Location: [place];  Service: Vascular;  Laterality: Left;   LOWER EXTREMITY ANGIOGRAPHY N/A 04/06/2017   Procedure: Lower Extremity Angiography - Right;  Surgeon:  Serafina Mitchell, MD;  Location: [place] [facility];  Service: Cardiovascular;  Laterality: N/A;   MEMBRANE PEEL Left 12/20/2018   Procedure: MEMBRANE PEEL;  Surgeon: Jalene Mullet, MD;  Location: [place];  Service: Ophthalmology;  Laterality: Left;   PATCH ANGIOPLASTY Right 01/18/2015   Procedure: BASILIC VEIN PATCH ANGIOPLASTY USING VASCUGUARD PATCH;  Surgeon: Angelia Mould, MD;  Location: [place];  Service: Vascular;  Laterality: Right;   PERIPHERAL VASCULAR BALLOON ANGIOPLASTY  09/22/2016   Procedure: Peripheral Vascular Balloon Angioplasty;  Surgeon: Serafina Mitchell, MD;  Location: [place] [facility];  Service: Cardiovascular;;  Lt. Fistula   PERIPHERAL VASCULAR BALLOON ANGIOPLASTY Left 03/22/2018   Procedure: PERIPHERAL VASCULAR BALLOON ANGIOPLASTY;  Surgeon: Serafina Mitchell, MD;  Location: [place] [facility];  Service: Cardiovascular;  Laterality: Left;  Arm shunt   PERIPHERAL VASCULAR CATHETERIZATION N/A 06/23/2016   Procedure: Abdominal Aortogram w/Lower Extremity;  Surgeon: Serafina Mitchell, MD;  Location: [place] [facility];  Service: Cardiovascular;  Laterality: N/A;   PERIPHERAL VASCULAR CATHETERIZATION  06/23/2016   Procedure: Peripheral Vascular Intervention;  Surgeon: Serafina Mitchell, MD;  Location: [place] [facility];  Service:  Cardiovascular;;  lt common and external illiac artery   PHOTOCOAGULATION WITH LASER Left 12/20/2018   Procedure: PHOTOCOAGULATION WITH LASER;  Surgeon: Jalene Mullet, MD;  Location: [place];  Service: Ophthalmology;  Laterality: Left;   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   Procedure: REPAIR OF COMPLEX TRACTION RETINAL DETACHMENT;  Surgeon: Jalene Mullet, MD;  Location: [place];  Service: Ophthalmology;  Laterality: Left;   REVISION OF ARTERIOVENOUS GORETEX GRAFT Left 09/05/2020   Procedure: REVISION OF ARTERIOVENOUS GORETEX GRAFT LEFT;  Surgeon: Cherre Robins, MD;  Location: [facility];  Service: Vascular;  Laterality: Left;    FAMILY HISTORY Family History  Problem Relation Age of Onset   Other Mother        not sure of cause of death   Diabetes Father    Diabetes Sister    Diabetes Sister    Pancreatic cancer Maternal Grandmother    Colon cancer Neg Hx     SOCIAL HISTORY Social History   Tobacco Use   Smoking status: Former    Packs/day: 0.35    Years: 40.00    Total pack years: 14.00    Types: Cigarettes    Quit date: 05/10/2012    Years since quitting: 9.7   Smokeless tobacco: Never  Vaping Use   Vaping Use: Never used  Substance Use Topics   Alcohol use: No   Drug use: No    Comment: marijuana; quit in early 1980's         OPHTHALMIC EXAM:  Base Eye Exam     Visual Acuity (ETDRS)       Right Left   Dist [place] 20/60 -1 20/100 -2   Dist ph [place] NI NI         Tonometry (Tonopen, 10:29 AM)       Right Left   Pressure 14 15         Pupils       Pupils APD   Right PERRL None   Left PERRL None         Visual Fields       Left Right    Full Full         Extraocular Movement       Right Left    Full, Ortho Full, Ortho  Neuro/Psych     Oriented x3: Yes   Mood/Affect: Normal         Dilation     Both eyes: 1.0% Mydriacyl, 2.5% Phenylephrine @ 10:29 AM           Slit Lamp and Fundus Exam      External Exam       Right Left   External Normal Normal         Slit Lamp Exam       Right Left   Lids/Lashes Normal Normal   Conjunctiva/Sclera White and quiet White and quiet   Cornea Clear Clear   Anterior Chamber Deep and quiet Deep and quiet   Iris Round and reactive Round and reactive   Lens Centered posterior chamber intraocular lens Centered posterior chamber intraocular lens   Anterior Vitreous Normal Normal, remnant         Fundus Exam       Right Left   Posterior Vitreous Clear, avitric Clear, avitric   Disc 1+ Pallor 1+ Pallor   C/D Ratio 0.5 0.5   Macula Microaneurysms, Moderate clinically significant macular edema, Epiretinal membrane over the fovea and extending inferotemporal to an old fibrous tissue island, progression of ERM. Severe clinically significant macular edema appears to emanate from region temporal aspect of the fovea with large cluster of microaneurysms   Vessels PDR-active with NVE inferior to nerve NVE temporal portion of macula, responsive for traction on epiretinal membrane fovea Clear resection of previous fibrovascular disease clear avitric   Periphery Good peripheral PRP Good peripheral PRP            IMAGING AND PROCEDURES  Imaging and Procedures for 02/12/22  OCT, Retina - OU - Both Eyes       Right Eye Quality was good. Scan locations included subfoveal. Central Foveal Thickness: 545. Progression has worsened. Findings include abnormal foveal contour, epiretinal membrane.   Left Eye Quality was good. Scan locations included subfoveal. Central Foveal Thickness: 765. Progression has improved.   Notes OD with residual ILM tortuosity contributing to the continued thickening OD will likely need vitrectomy membrane peel OD, with severe white epiretinal fibrosis over the fovea, if visual acuity affected  OS with persistent and increasing CSME, will need antivegF therapy today to maintain, and slightly improving today on Eylea      Intravitreal Injection, Pharmacologic Agent - OS - Left Eye       Time Out 02/12/2022. 11:15 AM. Confirmed correct patient, procedure, site, and patient consented.   Anesthesia Topical anesthesia was used. Anesthetic medications included Lidocaine 4%.   Procedure Preparation included 5% betadine to ocular surface, 10% betadine to eyelids, Tobramycin 0.3%. A 30 gauge needle was used.   Injection: 2 mg aflibercept 2 MG/0.05ML   Route: Intravitreal, Site: Left Eye   NDC: A3590391, Lot: [id number], Expiration date: 07/27/2022, Waste: 0 mL   Post-op Post injection exam found visual acuity of at least counting fingers. The patient tolerated the procedure well. There were no complications. The patient received written and verbal post procedure care education. Post injection medications were not given.              ASSESSMENT/PLAN:  Macular pucker, right eye Visual acuity OD now affected by severe epiretinal membrane over the fovea.  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  Repeat injection today and maintain current interval of follow-up of 5 to 6 weeks  Diabetic macular edema of right eye with proliferative retinopathy associated with type 2 diabetes mellitus ([place]) OD relatively quiescent and no real active macular edema just secondary thickening from ERM     ICD-10-CM   1. 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 Macular pucker, right eye  H35.371     3. Diabetic macular edema of right eye with proliferative retinopathy associated with  type 2 diabetes mellitus ([place])  E11.3511       1.  PDR OS quiescent yet severe CSME centrally.  Will need to control with intravitreal Eylea OS today repeat again today  2.  OD now with impacted vision by epiretinal membrane to maximize visual acuity will likely need vitrectomy membrane peel under local MAC anesthesia, [facility] setting given other systemic issues  3.  Ophthalmic Meds Ordered this visit:  Meds ordered this encounter  Medications   aflibercept (EYLEA) SOLN 2 mg       Return in about 6 weeks (around 03/26/2022) for dilate, OS, EYLEA OCT.  There are no Patient Instructions on file for this visit.   Explained the diagnoses, plan, and follow up with the patient and they expressed understanding.  Patient expressed understanding of the importance of proper follow up care.   Clent Demark Aaren Atallah M.D. Diseases & Surgery of the Retina and Vitreous Retina & Diabetic [place] 02/12/22     Abbreviations: M myopia (nearsighted); A astigmatism; H hyperopia (farsighted); P presbyopia; Mrx spectacle prescription;  CTL contact lenses; OD right eye; OS left eye; OU both eyes  XT exotropia; ET esotropia; PEK punctate epithelial keratitis; PEE punctate epithelial erosions; DES dry eye syndrome; MGD meibomian gland dysfunction; ATs artificial tears; PFAT's preservative free artificial tears; [place] nuclear sclerotic cataract; PSC posterior subcapsular cataract; ERM epi-retinal membrane; PVD posterior vitreous detachment; RD retinal detachment; DM diabetes mellitus; DR diabetic retinopathy; NPDR non-proliferative diabetic retinopathy; PDR proliferative diabetic retinopathy; CSME clinically significant macular edema; DME diabetic macular edema; dbh dot blot hemorrhages; CWS cotton wool spot; POAG primary open angle glaucoma; C/D cup-to-disc ratio; HVF humphrey visual field; GVF goldmann visual field; OCT optical coherence tomography; IOP intraocular pressure; BRVO Branch retinal vein  occlusion; CRVO central retinal vein occlusion; CRAO central retinal artery occlusion; BRAO branch retinal artery occlusion; RT retinal tear; SB scleral buckle; PPV pars plana vitrectomy; VH Vitreous hemorrhage; PRP panretinal laser photocoagulation; IVK intravitreal kenalog; VMT vitreomacular traction; MH Macular hole;  NVD neovascularization of the disc; NVE neovascularization elsewhere; AREDS age related eye disease study; ARMD age related macular degeneration; POAG primary open angle glaucoma; EBMD epithelial/anterior basement membrane dystrophy; ACIOL anterior chamber intraocular lens; IOL intraocular lens; PCIOL posterior chamber intraocular lens; Phaco/IOL phacoemulsification with intraocular lens placement; [place] photorefractive keratectomy; LASIK laser assisted in situ keratomileusis; HTN hypertension; DM diabetes mellitus; COPD chronic obstructive pulmonary disease

## 2022-02-13 DIAGNOSIS — D631 Anemia in chronic kidney disease: Secondary | ICD-10-CM | POA: Diagnosis not present

## 2022-02-13 DIAGNOSIS — Z992 Dependence on renal dialysis: Secondary | ICD-10-CM | POA: Diagnosis not present

## 2022-02-13 DIAGNOSIS — N186 End stage renal disease: Secondary | ICD-10-CM | POA: Diagnosis not present

## 2022-02-13 DIAGNOSIS — N2581 Secondary hyperparathyroidism of renal origin: Secondary | ICD-10-CM | POA: Diagnosis not present

## 2022-02-13 DIAGNOSIS — E876 Hypokalemia: Secondary | ICD-10-CM | POA: Diagnosis not present

## 2022-02-13 DIAGNOSIS — D509 Iron deficiency anemia, unspecified: Secondary | ICD-10-CM | POA: Diagnosis not present

## 2022-02-16 DIAGNOSIS — D631 Anemia in chronic kidney disease: Secondary | ICD-10-CM | POA: Diagnosis not present

## 2022-02-16 DIAGNOSIS — D509 Iron deficiency anemia, unspecified: Secondary | ICD-10-CM | POA: Diagnosis not present

## 2022-02-16 DIAGNOSIS — N186 End stage renal disease: Secondary | ICD-10-CM | POA: Diagnosis not present

## 2022-02-16 DIAGNOSIS — E876 Hypokalemia: Secondary | ICD-10-CM | POA: Diagnosis not present

## 2022-02-16 DIAGNOSIS — Z992 Dependence on renal dialysis: Secondary | ICD-10-CM | POA: Diagnosis not present

## 2022-02-16 DIAGNOSIS — N2581 Secondary hyperparathyroidism of renal origin: Secondary | ICD-10-CM | POA: Diagnosis not present

## 2022-02-17 ENCOUNTER — Ambulatory Visit (INDEPENDENT_AMBULATORY_CARE_PROVIDER_SITE_OTHER): Payer: Medicare Other

## 2022-02-17 ENCOUNTER — Ambulatory Visit: Payer: Medicare Other | Admitting: Student

## 2022-02-17 ENCOUNTER — Encounter: Payer: Self-pay | Admitting: Student

## 2022-02-17 VITALS — BP 132/51 | HR 54 | Temp 97.6°F | Resp 17 | Ht 65.0 in | Wt 133.4 lb

## 2022-02-17 DIAGNOSIS — I110 Hypertensive heart disease with heart failure: Secondary | ICD-10-CM | POA: Diagnosis not present

## 2022-02-17 DIAGNOSIS — J309 Allergic rhinitis, unspecified: Secondary | ICD-10-CM | POA: Diagnosis not present

## 2022-02-17 NOTE — Progress Notes (Signed)
Primary Physician/Referring:  Cipriano Mile, NP  Patient ID: Destiny Day, female    DOB: 1951-07-03, 71 y.o.   MRN: [id number]  Chief Complaint  Patient presents with   Hypertension    4 WEEKS   HPI:    Destiny Day  is a 71 y.o. female  Destiny Day female with difficult to control hypertension in the past, uncontrolled diabetes mellitus, hyperlipidemia and end-stage renal disease on hemodialysis since 2017, no CAD by angiography surprisingly in 2007, peripheral arterial disease with history of left iliac artery stenting and bilateral femoro-popliteal grafting,  s/p left transmetatarsal amputation on 09/01/2016, and right TMA on 12-31-17 by Dr. Sharol Given due to diabetic non healing ulceration, also has diabetic retinopathy.  She was last seen in the office 01/08/2022 at which time her blood pressure was soft and due to episodes of dizziness reduced amlodipine from 10 mg to 5 mg p.o. daily.  She was advised to continue to follow with vascular surgery for management of PAD.  Patient now presents for 6-week follow-up of blood pressure.  With reduced dose of amlodipine patient's blood pressure remains well controlled, however she has had no recurrence of dizziness/lightheadedness.  Past Medical History:  Diagnosis Date   Abdominal bruit    Anxiety    Arthritis    Osteoarthritis   Asthma    Cervical disc disease    "pinced nerve"   CHF (congestive heart failure) (HCC)    Diabetes mellitus    Type II   Diverticulitis    ESRD (end stage renal disease) ([place])    dialysis - M/W/F- [place]   GERD (gastroesophageal reflux disease)    from medications   GI bleed 03/31/2013   Head injury 07/2017   History of hiatal hernia    Hyperlipidemia    Hypertension    Neuropathy    left leg   Nuclear sclerotic cataract of right eye 04/23/2020   Osteoporosis    Peripheral vascular disease ([place])    Pneumonia    "very young" and a few years ago   Right eye affected by  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)    Vitrectomy membrane peel endolaser 05-07-2021   Seasonal allergies    Shortness of breath dyspnea    WIth exertion, when fluid builds   Sleep apnea    can't afford cpap    Vitreomacular traction syndrome, 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   05-07-2021  Vitrectomy, membrane peel, PRP, resection posterior segment of NVD, remnants of peripheral NVE remain in place due to atrophic retina, no vitreous substitute   Past Surgical History:  Procedure Laterality Date   A/V SHUNTOGRAM N/A 09/22/2016   Procedure: A/V Shuntogram - left arm;  Surgeon: Serafina Mitchell, MD;  Location: [place] [facility];  Service: Cardiovascular;  Laterality: N/A;   A/V SHUNTOGRAM N/A 03/22/2018   Procedure: A/V SHUNTOGRAM - left arm;  Surgeon: Serafina Mitchell, MD;  Location: [place] [facility];  Service: Cardiovascular;  Laterality: N/A;   A/V SHUNTOGRAM Left 11/17/2018   Procedure: A/V SHUNTOGRAM;  Surgeon: Angelia Mould, MD;  Location: [place] [facility];  Service: Cardiovascular;  Laterality: Left;   ABDOMINAL HYSTERECTOMY  1993`   AMPUTATION Left 09/01/2016   Procedure: LEFT FOOT TRANSMETATARSAL AMPUTATION;  Surgeon: Newt Minion, MD;  Location: [place];  Service: Orthopedics;  Laterality: Left;   AMPUTATION Right 08/11/2017   Procedure: RIGHT GREAT TOE AMPUTATION DIGIT;  Surgeon: Rosetta Posner, MD;  Location: [place] [facility];  Service: Vascular;  Laterality: Right;   AMPUTATION Right 12/31/2017   Procedure: RIGHT TRANSMETATARSAL AMPUTATION;  Surgeon: Newt Minion, MD;  Location: [place];  Service: Orthopedics;  Laterality: Right;   AV FISTULA PLACEMENT Left 04/21/2016   Procedure: INSERTION OF ARTERIOVENOUS (AV) GORE-TEX GRAFT ARM LEFT;  Surgeon: Elam Dutch, MD;  Location: [place];  Service: Vascular;  Laterality: Left;   BASCILIC VEIN TRANSPOSITION Left 07/10/2014   Procedure: BASCILIC VEIN TRANSPOSITION;  Surgeon: Angelia Mould, MD;   Location: [place];  Service: Vascular;  Laterality: Left;   [place] Right 11/08/2014   Procedure: FIRST STAGE BASILIC VEIN TRANSPOSITION;  Surgeon: Angelia Mould, MD;  Location: [place];  Service: Vascular;  Laterality: Right;   [place] Right 01/18/2015   Procedure: SECOND STAGE BASILIC VEIN TRANSPOSITION;  Surgeon: Angelia Mould, MD;  Location: [place];  Service: Vascular;  Laterality: Right;   CRANIOTOMY N/A 08/23/2017   Procedure: CRANIOTOMY HEMATOMA EVACUATION SUBDURAL;  Surgeon: Ashok Pall, MD;  Location: [place];  Service: Neurosurgery;  Laterality: N/A;   CRANIOTOMY Left 08/24/2017   Procedure: CRANIOTOMY FOR RECURRENT ACUTE SUBDURAL HEMATOMA;  Surgeon: Ashok Pall, MD;  Location: [place];  Service: Neurosurgery;  Laterality: Left;   ESOPHAGOGASTRODUODENOSCOPY N/A 03/31/2013   Procedure: ESOPHAGOGASTRODUODENOSCOPY (EGD);  Surgeon: Gatha Mayer, MD;  Location: [facility] ENDOS[facility];  Service: Endoscopy;  Laterality: N/A;   EYE SURGERY     laser surgery   FEMORAL-POPLITEAL BYPASS GRAFT Left 07/08/2016   Procedure: LEFT  FEMORAL-BELOW KNEE POPLITEAL ARTERY BYPASS GRAFT USING 6MM X 80 CM PROPATEN GORETEX GRAFT WITH RINGS.;  Surgeon: Rosetta Posner, MD;  Location: [facility] OR;  Service: Vascular;  Laterality: Left;   FEMORAL-POPLITEAL BYPASS GRAFT Right 08/11/2017   Procedure: RIGHT FEMORAL TO BELOW KNEE POPLITEAL ARTERKY  BYPASS GRAFT USING 6MM RINGED PROPATEN GRAFT;  Surgeon: Rosetta Posner, MD;  Location: [place];  Service: Vascular;  Laterality: Right;   FISTULOGRAM Left 10/29/2014   Procedure: FISTULOGRAM;  Surgeon: Angelia Mould, MD;  Location: [facility] CATH LAB;  Service: Cardiovascular;  Laterality: Left;   GAS/FLUID EXCHANGE Left 12/20/2018   Procedure: AIR GAS EXCHANGE;  Surgeon: Jalene Mullet, MD;  Location: [place];  Service: Ophthalmology;  Laterality: Left;   INSERTION OF DIALYSIS CATHETER N/A 09/05/2020   Procedure: INSERTION OF DIALYSIS CATHETER;   Surgeon: Cherre Robins, MD;  Location: [place];  Service: Vascular;  Laterality: N/A;   INSERTION OF DIALYSIS CATHETER Left 03/13/2021   Procedure: INSERTION OF DIALYSIS CATHETER;  Surgeon: Waynetta Sandy, MD;  Location: [place];  Service: Vascular;  Laterality: Left;   KNEE ARTHROSCOPY Right 05/06/2018   Procedure: RIGHT KNEE ARTHROSCOPY;  Surgeon: Newt Minion, MD;  Location: [place];  Service: Orthopedics;  Laterality: Right;   KNEE ARTHROSCOPY Right 06/10/2018   Procedure: RIGHT KNEE ARTHROSCOPY AND DEBRIDEMENT;  Surgeon: Newt Minion, MD;  Location: [place];  Service: Orthopedics;  Laterality: Right;   LIGATION OF ARTERIOVENOUS  FISTULA Left 04/21/2016   Procedure: LIGATION OF ARTERIOVENOUS  FISTULA LEFT ARM;  Surgeon: Elam Dutch, MD;  Location: [place];  Service: Vascular;  Laterality: Left;   LOWER EXTREMITY ANGIOGRAPHY N/A 04/06/2017   Procedure: Lower Extremity Angiography - Right;  Surgeon: Serafina Mitchell, MD;  Location: [place] [facility];  Service: Cardiovascular;  Laterality: N/A;   MEMBRANE PEEL Left 12/20/2018   Procedure: MEMBRANE PEEL;  Surgeon: Jalene Mullet, MD;  Location: [facility]  OR;  Service: Ophthalmology;  Laterality: Left;   PATCH ANGIOPLASTY Right 01/18/2015   Procedure: BASILIC VEIN PATCH ANGIOPLASTY USING VASCUGUARD PATCH;  Surgeon: Angelia Mould, MD;  Location: [place];  Service: Vascular;  Laterality: Right;   PERIPHERAL VASCULAR BALLOON ANGIOPLASTY  09/22/2016   Procedure: Peripheral Vascular Balloon Angioplasty;  Surgeon: Serafina Mitchell, MD;  Location: [place] [facility];  Service: Cardiovascular;;  Lt. Fistula   PERIPHERAL VASCULAR BALLOON ANGIOPLASTY Left 03/22/2018   Procedure: PERIPHERAL VASCULAR BALLOON ANGIOPLASTY;  Surgeon: Serafina Mitchell, MD;  Location: [place] [facility];  Service: Cardiovascular;  Laterality: Left;  Arm shunt   PERIPHERAL VASCULAR CATHETERIZATION N/A 06/23/2016   Procedure: Abdominal Aortogram w/Lower Extremity;  Surgeon:  Serafina Mitchell, MD;  Location: [place] [facility];  Service: Cardiovascular;  Laterality: N/A;   PERIPHERAL VASCULAR CATHETERIZATION  06/23/2016   Procedure: Peripheral Vascular Intervention;  Surgeon: Serafina Mitchell, MD;  Location: [place] [facility];  Service: Cardiovascular;;  lt common and external illiac artery   PHOTOCOAGULATION WITH LASER Left 12/20/2018   Procedure: PHOTOCOAGULATION WITH LASER;  Surgeon: Jalene Mullet, MD;  Location: [place];  Service: Ophthalmology;  Laterality: Left;   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   Procedure: REPAIR OF COMPLEX TRACTION RETINAL DETACHMENT;  Surgeon: Jalene Mullet, MD;  Location: [place];  Service: Ophthalmology;  Laterality: Left;   REVISION OF ARTERIOVENOUS GORETEX GRAFT Left 09/05/2020   Procedure: REVISION OF ARTERIOVENOUS GORETEX GRAFT LEFT;  Surgeon: Cherre Robins, MD;  Location: [facility] OR;  Service: Vascular;  Laterality: Left;   Family History  Problem Relation Age of Onset   Other Mother        not sure of cause of death   Diabetes Father    Diabetes Sister    Diabetes Sister    Pancreatic cancer Maternal Grandmother    Colon cancer Neg Hx     Social History   Tobacco Use   Smoking status: Former    Packs/day: 0.35    Years: 40.00    Total pack years: 14.00    Types: Cigarettes    Quit date: 05/10/2012    Years since quitting: 9.7   Smokeless tobacco: Never  Substance Use Topics   Alcohol use: No   Marital Status: Married  ROS  Review of Systems  Eyes:  Positive for blurred vision.  Cardiovascular:  Negative for dyspnea on exertion, leg swelling and syncope.  Respiratory:  Negative for cough.   Musculoskeletal:  Positive for arthritis. Negative for muscle weakness.  Gastrointestinal:  Negative for melena.  Neurological:  Negative for dizziness.   Objective  Blood pressure (!) 132/51, pulse (!) 54, temperature 97.6 F (36.4 C), temperature source Temporal, resp. rate 17, height '5\' 5"'$  (1.651  m), weight 133 lb 6.4 oz (60.5 kg), SpO2 92 %.     02/17/2022   10:34 AM 02/17/2022   10:23 AM 01/22/2022   10:26 AM  Vitals with BMI  Height  '5\' 5"'$  '5\' 5"'$   Weight  133 lbs 6 oz 139 lbs 6 oz  BMI  29.5 62.1  Systolic 308 657 846  Diastolic 51 58 74  Pulse 54 90 85     Physical Exam Vitals reviewed.  Constitutional:      General: She is not in acute distress. Neck:     Vascular: Carotid bruit (bilateral) present. No JVD.  Cardiovascular:     Rate and Rhythm: Normal rate and regular rhythm.  Pulses:          Femoral pulses are 2+ on the right side with bruit and 2+ on the left side with bruit.      Popliteal pulses are 1+ on the right side and 1+ on the left side.        Right dorsalis pedis pulse not accessible and left dorsalis pedis pulse not accessible.        Right posterior tibial pulse not accessible and left posterior tibial pulse not accessible.     Heart sounds: No murmur heard.    No gallop.     Comments: Left arm AV fistula for dialysis noted. Pulmonary:     Effort: Pulmonary effort is normal. No accessory muscle usage.     Breath sounds: Normal breath sounds.     Laboratory examination:      Latest Ref Rng & Units 08/04/2021    3:46 AM 08/03/2021    4:45 AM 03/13/2021   11:11 AM  CMP  Glucose 70 - 99 mg/dL 129  137  72   BUN 8 - 23 mg/dL '27  18  20   '$ Creatinine 0.44 - 1.00 mg/dL 6.70  5.31  4.50   Sodium 135 - 145 mmol/L 133  132  137   Potassium 3.5 - 5.1 mmol/L 3.3  2.9  4.0   Chloride 98 - 111 mmol/L 94  92  97   CO2 22 - 32 mmol/L 24  27    Calcium 8.9 - 10.3 mg/dL 8.4  9.5        Latest Ref Rng & Units 08/04/2021    3:46 AM 03/13/2021   11:11 AM 01/05/2021    6:16 AM  CBC  WBC 4.0 - 10.5 K/uL 6.4   6.9   Hemoglobin 12.0 - 15.0 g/dL 10.4  13.3  12.1   Hematocrit 36.0 - 46.0 % 31.7  39.0  39.2   Platelets 150 - 400 K/uL 226   268    Lipid Panel     Component Value Date/Time   CHOL 204 (H) 01/04/2020 1049   TRIG 111 01/04/2020 1049   HDL 78  01/04/2020 1049   CHOLHDL 2.1 12/01/2010 0455   VLDL 17 12/01/2010 0455   LDLCALC 107 (H) 01/04/2020 1049   HEMOGLOBIN A1C Lab Results  Component Value Date   HGBA1C 6.4 (H) 08/04/2021   MPG 136.98 08/04/2021   TSH No results for input(s): "TSH" in the last 8760 hours.   External labs:   12/08/2019: H/H 11.2/32. MCV 92. Platelets 269.  Lipid Panel completed 09/01/2018 Cholesterol, total 204.000 m 09/01/2018 Triglycerides 68.000 09/01/2018 HDL 78 MG/DL 09/01/2018 LDL 112.000 m 09/01/2018  Glucose Random 102.000 06/26/2019 BUN 35.000 06/26/2019 Creatinine, Serum 7.650 06/26/2019  A1C 5.600 06/26/2019  TSH 1.240 micr 09/01/2018  FOBT: Normal 09/16/2018   MicroAlbumin Urine 500.000 01/12/2017 MicroAlbumin/Creat 244.9 MG/ 01/12/2017  Allergies   Allergies  Allergen Reactions   Adhesive  [Tape] Hives   Lisinopril Cough   Prednisone Swelling and Other (See Comments)    Excessive fluid buildup    Medications Prior to Visit:   Outpatient Medications Prior to Visit  Medication Sig Dispense Refill   albuterol (PROAIR HFA) 108 (90 Base) MCG/ACT inhaler Inhale 2 puffs into the lungs every 4 (four) hours as needed for wheezing or shortness of breath. 18 g 1   amLODipine (NORVASC) 5 MG tablet Take by mouth.     aspirin EC 81 MG tablet Take 81 mg by mouth daily.  atorvastatin (LIPITOR) 80 MG tablet Take 80 mg by mouth at bedtime.     busPIRone (BUSPAR) 7.5 MG tablet Take 7.5 mg by mouth 2 (two) times daily as needed (anxiety).     carvedilol (COREG) 12.5 MG tablet Take 12.5 mg by mouth See admin instructions. Take 1 tablet (12.5 mg) by mouth once daily in the evening on Mondays, Wednesdays, & Fridays. (Dialysis) Take 1 tablet (12.5 mg) by mouth twice daily on Sundays, Tuesdays, Thursdays, & Saturdays. (Non Dialysis)     cinacalcet (SENSIPAR) 90 MG tablet Take 90 mg by mouth See admin instructions. Take 1 tablet (90 mg) by mouth in the evening on Mondays, Wednesdays, & Fridays  (dialysis) Take 1 tablet (90 mg) by mouth twice daily on Sundays, Tuesdays, Thursdays, Saturdays. (non dialysis)     Doxercalciferol (HECTOROL IV) Doxercalciferol (Hectorol)     EPINEPHrine (AUVI-Q) 0.3 mg/0.3 mL IJ SOAJ injection Inject 0.3 mg into the muscle as needed for anaphylaxis. 1 each 1   ferric citrate (AURYXIA) 1 GM 210 MG(Fe) tablet Take 210-420 mg by mouth See admin instructions. Take 2 tablets (420 mg) by mouth with each meal & take 1 tablet (210 mg) by mouth with snacks.     fluticasone (FLONASE) 50 MCG/ACT nasal spray Place 2 sprays into both nostrils daily as needed for allergies or rhinitis. 16 g 5   gabapentin (NEURONTIN) 100 MG capsule TAKE 1 CAPSULE (100 MG TOTAL) BY MOUTH AT BEDTIME. WHEN NECESSARY FOR NEUROPATHY PAIN (Patient taking differently: Take 100 mg by mouth at bedtime as needed (pain). When necessary for neuropathy pain) 30 capsule 3   glipiZIDE (GLUCOTROL) 5 MG tablet Take 5 mg by mouth daily before breakfast.     guaiFENesin-codeine 100-10 MG/5ML syrup Take 5 mLs by mouth 3 (three) times daily as needed for cough. 473 mL 0   isosorbide-hydrALAZINE (BIDIL) 20-37.5 MG tablet Take 1 tablet by mouth See admin instructions. hs on Monday,Wednesday,Friday (dialysis days) Tid on Tuesday,Thursday,Saturday,Sunday(non-dialysis days) 270 tablet 3   loperamide (IMODIUM) 2 MG capsule Take 2 capsules (4 mg total) by mouth 3 (three) times daily as needed for diarrhea or loose stools. 30 capsule 0   Methoxy PEG-Epoetin Beta (MIRCERA IJ) Mircera     montelukast (SINGULAIR) 10 MG tablet TAKE 1 TABLET BY MOUTH EVERYDAY AT BEDTIME 30 tablet 5   omeprazole (PRILOSEC) 20 MG capsule Take 20 mg by mouth daily.      ondansetron (ZOFRAN) 4 MG tablet Take 1 tablet (4 mg total) by mouth every 6 (six) hours as needed for nausea. 20 tablet 0   polyethylene glycol (MIRALAX / GLYCOLAX) 17 g packet Take 17 g by mouth daily. (Patient taking differently: Take 17 g by mouth daily as needed for mild  constipation.) 14 each 0   traZODone (DESYREL) 50 MG tablet Take 50 mg by mouth at bedtime as needed for sleep.     Vitamin D, Ergocalciferol, (DRISDOL) 1.25 MG (50000 UT) CAPS capsule Take 50,000 Units by mouth every Sunday. Takes on Sunday     fluticasone-salmeterol (ADVAIR HFA) 115-21 MCG/ACT inhaler Inhale two puffs twice daily 1 each 5   latanoprost (XALATAN) 0.005 % ophthalmic solution Place 1 drop into the left eye at bedtime. 2.5 mL 5   lidocaine-prilocaine (EMLA) cream Apply 1 application topically 3 (three) times a week. 1-2 hours prior to dialysis  12   Spacer/Aero-Holding Chambers (AEROCHAMBER PLUS) inhaler Use with inhaler (Patient not taking: Reported on 02/17/2022) 1 each 2   amLODipine (NORVASC) 10  MG tablet Take 5 mg by mouth at bedtime.     No facility-administered medications prior to visit.   Final Medications at End of Visit    Current Meds  Medication Sig   albuterol (PROAIR HFA) 108 (90 Base) MCG/ACT inhaler Inhale 2 puffs into the lungs every 4 (four) hours as needed for wheezing or shortness of breath.   amLODipine (NORVASC) 5 MG tablet Take by mouth.   aspirin EC 81 MG tablet Take 81 mg by mouth daily.   atorvastatin (LIPITOR) 80 MG tablet Take 80 mg by mouth at bedtime.   busPIRone (BUSPAR) 7.5 MG tablet Take 7.5 mg by mouth 2 (two) times daily as needed (anxiety).   carvedilol (COREG) 12.5 MG tablet Take 12.5 mg by mouth See admin instructions. Take 1 tablet (12.5 mg) by mouth once daily in the evening on Mondays, Wednesdays, & Fridays. (Dialysis) Take 1 tablet (12.5 mg) by mouth twice daily on Sundays, Tuesdays, Thursdays, & Saturdays. (Non Dialysis)   cinacalcet (SENSIPAR) 90 MG tablet Take 90 mg by mouth See admin instructions. Take 1 tablet (90 mg) by mouth in the evening on Mondays, Wednesdays, & Fridays (dialysis) Take 1 tablet (90 mg) by mouth twice daily on Sundays, Tuesdays, Thursdays, Saturdays. (non dialysis)   Doxercalciferol (HECTOROL IV)  Doxercalciferol (Hectorol)   EPINEPHrine (AUVI-Q) 0.3 mg/0.3 mL IJ SOAJ injection Inject 0.3 mg into the muscle as needed for anaphylaxis.   ferric citrate (AURYXIA) 1 GM 210 MG(Fe) tablet Take 210-420 mg by mouth See admin instructions. Take 2 tablets (420 mg) by mouth with each meal & take 1 tablet (210 mg) by mouth with snacks.   fluticasone (FLONASE) 50 MCG/ACT nasal spray Place 2 sprays into both nostrils daily as needed for allergies or rhinitis.   gabapentin (NEURONTIN) 100 MG capsule TAKE 1 CAPSULE (100 MG TOTAL) BY MOUTH AT BEDTIME. WHEN NECESSARY FOR NEUROPATHY PAIN (Patient taking differently: Take 100 mg by mouth at bedtime as needed (pain). When necessary for neuropathy pain)   glipiZIDE (GLUCOTROL) 5 MG tablet Take 5 mg by mouth daily before breakfast.   guaiFENesin-codeine 100-10 MG/5ML syrup Take 5 mLs by mouth 3 (three) times daily as needed for cough.   isosorbide-hydrALAZINE (BIDIL) 20-37.5 MG tablet Take 1 tablet by mouth See admin instructions. hs on Monday,Wednesday,Friday (dialysis days) Tid on Tuesday,Thursday,Saturday,Sunday(non-dialysis days)   loperamide (IMODIUM) 2 MG capsule Take 2 capsules (4 mg total) by mouth 3 (three) times daily as needed for diarrhea or loose stools.   Methoxy PEG-Epoetin Beta (MIRCERA IJ) Mircera   montelukast (SINGULAIR) 10 MG tablet TAKE 1 TABLET BY MOUTH EVERYDAY AT BEDTIME   omeprazole (PRILOSEC) 20 MG capsule Take 20 mg by mouth daily.    ondansetron (ZOFRAN) 4 MG tablet Take 1 tablet (4 mg total) by mouth every 6 (six) hours as needed for nausea.   polyethylene glycol (MIRALAX / GLYCOLAX) 17 g packet Take 17 g by mouth daily. (Patient taking differently: Take 17 g by mouth daily as needed for mild constipation.)   traZODone (DESYREL) 50 MG tablet Take 50 mg by mouth at bedtime as needed for sleep.   Vitamin D, Ergocalciferol, (DRISDOL) 1.25 MG (50000 UT) CAPS capsule Take 50,000 Units by mouth every Sunday. Takes on Sunday   Radiology:    No results found.  Cardiac Studies:   Echocardiogram 01/21/2021:  Normal LV systolic function with EF 55%. Left ventricle cavity is small.  Moderate concentric hypertrophy of the left ventricle. Normal global wall  motion. Doppler  evidence of grade I (impaired) diastolic dysfunction,  elevated LAP. Calculated EF 55%.  No mitral valve regurgitation. Mild mitral valve leaflet calcification. No  evidence of mitral valve stenosis.  Lower Extremity Arterial Duplex 01/10/2019: Right: Patent right femoral - below knee popliteal artery bypass graft without evidence of restenosis.  50-74% stenosis noted in the inflow artery.  Left: Patent right femoral - below knee popliteal artery bypass graft without evidence of restenosis.  50-74% stenosis noted in the inflow artery.   EKG  01/08/2022: Sinus tachycardia at a rate of 107 bpm.  Normal axis.  Left atrial enlargement.  Incomplete right bundle branch block.  Nonspecific ST abnormality.  Unchanged compared to EKG 01/09/2021.  Assessment     ICD-10-CM   1. Hypertensive heart disease with heart failure (HCC)  I11.0        Recommendations:   Destiny Day  is a 71 y.o. female  Destiny Day female with difficult to control hypertension in the past, uncontrolled diabetes mellitus, hyperlipidemia and end-stage renal disease on hemodialysis since 2017, no CAD by angiography surprisingly in 2007, peripheral arterial disease with history of left iliac artery stenting and bilateral femoro-popliteal grafting,  s/p left transmetatarsal amputation on 09/01/2016, and right TMA on 12-31-17 by Dr. Sharol Given due to diabetic non healing ulceration, also has diabetic retinopathy.  She was last seen in the office 01/08/2022 at which time her blood pressure was soft and due to episodes of dizziness reduced amlodipine from 10 mg to 5 mg p.o. daily.  She was advised to continue to follow with vascular surgery for management of PAD.  Patient now  presents for 6-week follow-up of blood pressure.  Blood pressure remains well controlled but dizziness/lightheadedness has improved with reduced dose of amlodipine, will continue amlodipine 5 mg p.o. daily.  Otherwise no changes at today's office visit.  Follow-up in 1 year, sooner if needed.   Alethia Berthold, PA-C 02/17/2022, 10:53 AM Office: [phone]

## 2022-02-18 DIAGNOSIS — D631 Anemia in chronic kidney disease: Secondary | ICD-10-CM | POA: Diagnosis not present

## 2022-02-18 DIAGNOSIS — E876 Hypokalemia: Secondary | ICD-10-CM | POA: Diagnosis not present

## 2022-02-18 DIAGNOSIS — N186 End stage renal disease: Secondary | ICD-10-CM | POA: Diagnosis not present

## 2022-02-18 DIAGNOSIS — D509 Iron deficiency anemia, unspecified: Secondary | ICD-10-CM | POA: Diagnosis not present

## 2022-02-18 DIAGNOSIS — Z992 Dependence on renal dialysis: Secondary | ICD-10-CM | POA: Diagnosis not present

## 2022-02-18 DIAGNOSIS — N2581 Secondary hyperparathyroidism of renal origin: Secondary | ICD-10-CM | POA: Diagnosis not present

## 2022-02-19 ENCOUNTER — Ambulatory Visit (INDEPENDENT_AMBULATORY_CARE_PROVIDER_SITE_OTHER): Payer: Medicare Other | Admitting: Allergy & Immunology

## 2022-02-19 ENCOUNTER — Encounter: Payer: Self-pay | Admitting: Allergy & Immunology

## 2022-02-19 VITALS — BP 142/68 | HR 62 | Temp 97.8°F | Resp 18 | Ht 65.0 in | Wt 136.1 lb

## 2022-02-19 DIAGNOSIS — I7025 Atherosclerosis of native arteries of other extremities with ulceration: Secondary | ICD-10-CM

## 2022-02-19 DIAGNOSIS — J301 Allergic rhinitis due to pollen: Secondary | ICD-10-CM

## 2022-02-19 DIAGNOSIS — J449 Chronic obstructive pulmonary disease, unspecified: Secondary | ICD-10-CM

## 2022-02-19 DIAGNOSIS — R053 Chronic cough: Secondary | ICD-10-CM

## 2022-02-19 DIAGNOSIS — K9049 Malabsorption due to intolerance, not elsewhere classified: Secondary | ICD-10-CM

## 2022-02-19 MED ORDER — MONTELUKAST SODIUM 10 MG PO TABS: ORAL_TABLET | ORAL | 5 refills | Status: DC

## 2022-02-19 MED ORDER — FLUTICASONE PROPIONATE 50 MCG/ACT NA SUSP: 2.0000 | Freq: Every day | NASAL | 5 refills | Status: DC | PRN

## 2022-02-19 NOTE — Progress Notes (Signed)
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 Asthma COPD overlap syndrome - Lung testing looked slightly better today.  - It did improve with the albuterol puffs. - We with continue with the Symbicort and try to get this approved through [place] [employer].  - We may have to order a chest CT to get a look at your lung tissue. - I am sure that the smoking is not helping, so I would try to stop that for sure.  - We could consider an injectable medication for your asthma (to help the Symbicort work BETTER).  - Daily controller medication(s): Singulair 58m daily only + Symbicort two puffs two puffs twice daily - Prior to physical activity: albuterol 2 puffs 10-15 minutes before physical activity. - Rescue medications: albuterol 4 puffs every 4-6 hours as needed or albuterol nebulizer one vial every 4-6 hours as needed - Asthma control goals:  * Full participation in all desired activities (may need albuterol before activity) * Albuterol use two time or less a week on average (not counting use with activity) * Cough interfering with sleep two time or less a month * Oral steroids no more than once a year * No hospitalizations  2. Chronic rhinitis (dust mites, cat, dog, grass, and indoor and outdoor molds) - Contonue with allergy shots at the same schedule.  - Continue with Xyzal 514mdaily. - Continue with Singulair 104maily. - Continue with Flonase one spray per nostril twice daily and Astelin one spray per nostril twice daily.   3. Concern for food allergies. - We are going to get some testing for  onions as well as the most common foods. - Hopefully they can draw this when they do dialysis.   4. GERD  - We are going to increase your reflux medicine to 6m17mily. - This is not cleared via the kidneys so we should be fine.   5. Return in about 6 weeks (around 04/02/2022).   Subjective:   Destiny Day 71 y35. female presenting today for follow up of  Chief Complaint  Patient presents with   Follow-up   Cough    Destiny Day a history of the following: Patient Active Problem List   Diagnosis Date Noted   Primary open angle glaucoma of left eye, mild stage 10/23/2021   ESRD (end stage renal disease) (HCC)[place]/03/2022   Fluid overload 08/03/2021   Prolonged QT interval 08/03/2021   Acute on chronic heart failure (HCC)[place]/06/2021   Macular pucker, right eye 10/29/2020   Nausea with vomiting, unspecified 09/10/2020   Pseudophakia of left eye 07/02/2020   Diabetic macular edema of right eye with proliferative retinopathy associated with type 2 diabetes mellitus (HCC)[place]/01/2020   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 type 2 diabetes mellitus (HCC)[place]/28/2021   Allergy, unspecified, initial encounter 04/04/2020   Anaphylactic shock, unspecified, initial encounter 04/04/2020   Secondary hypertension, unspecified 12/13/2019   Other disorders of phosphorus metabolism 08/24/2019   Paroxysmal atrial fibrillation (HCC)[place]/17/2020   Hypocalcemia 04/17/2019   Old bucket handle tear of lateral meniscus  of right knee    Old peripheral tear of medial meniscus of right knee    Staphylococcal arthritis, unspecified joint ([place]) 05/11/2018   Septic arthritis of knee, right ([place]) 05/06/2018   Degenerative tear of posterior horn of medial meniscus, right    Old complex tear of lateral meniscus of right knee    Loose body in knee, right    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/03/2018   End-stage renal  disease on hemodialysis ([place])    Respiratory failure ([place]) 01/21/2018   Achilles tendon contracture, right 11/09/2017   Labile blood glucose    Anemia of infection and chronic disease    Black stool    Right sided weakness    Subdural hemorrhage following injury ([place]) 08/30/2017   Diabetic peripheral neuropathy ([place])    Chronic obstructive pulmonary disease ([place])    Subdural hematoma ([place]) 08/24/2017   Traumatic subdural hematoma ([place]) 08/23/2017   Fall    SDH (subdural hematoma) ([place])    Acute respiratory failure with hypoxia ([place])    Diabetes mellitus type 2 in nonobese ([place])    Leukocytosis    Labile blood pressure    Generalized anxiety disorder    Debility 08/16/2017   Amputated great toe of right foot ([place])    Amputated toe of left foot ([place])    Post-operative pain    ESRD on dialysis [facility])    Anxiety state    Acute blood loss anemia    Diarrhea, unspecified 01/16/2017   Dependence on renal dialysis ([place]) 01/06/2017   Idiopathic chronic venous hypertension of both lower extremities with inflammation 10/13/2016   S/P transmetatarsal amputation of foot, left ([place]) 09/21/2016   Onychomycosis 08/15/2016   Unspecified protein-calorie malnutrition ([place]) 07/29/2016   Other mechanical complication of femoral arterial graft (bypass), initial encounter ([place]) 07/14/2016   Other pneumonia, unspecified organism 07/11/2016   Age-related osteoporosis without current pathological fracture 07/11/2016   Cervical disc disorder, unspecified, unspecified cervical region 07/11/2016   Diverticulitis of intestine, part unspecified, without perforation or abscess without bleeding 07/11/2016   Unspecified abdominal hernia without obstruction or gangrene 07/11/2016   Gastro-esophageal reflux disease with esophagitis 07/11/2016   Primary generalized (osteo)arthritis 07/11/2016   Peripheral vascular disease ([place]) 07/08/2016   Cellulitis of left toe 06/29/2016   Atherosclerosis of native artery of  left lower extremity with gangrene ([place]) 36/14/4315   Complication of vascular dialysis catheter 02/29/2016   Encounter for screening for other viral diseases 01/10/2016   Encounter for removal of sutures 10/29/2015   Other fluid overload 10/23/2015   Anemia in chronic kidney disease 10/16/2015   Essential (primary) hypertension 10/16/2015   Acute pulmonary edema ([place]) 10/16/2015   Coagulation defect, unspecified ([place]) 10/16/2015   Acute respiratory failure ([place]) 10/16/2015   Hyperlipidemia, unspecified 10/16/2015   Iron deficiency anemia, unspecified 10/16/2015   Pain, unspecified 10/16/2015   Pruritus, unspecified 10/16/2015   Secondary hyperparathyroidism of renal origin ([place]) 10/16/2015   Shortness of breath 10/16/2015   GERD (gastroesophageal reflux disease) 10/07/2015   ARF (acute renal failure) ([place])    Hypothermia 06/12/2015   End stage renal disease ([place]) 06/12/2015   Unspecified diastolic (congestive) heart failure ([place]) 07/30/2014   CKD (chronic kidney disease) stage 4, GFR 15-29 ml/min (HCC) 06/27/2014   Hypertensive heart disease 05/25/2013   CKD (chronic kidney disease) stage 3, GFR 30-59 ml/min (HCC) 05/25/2013   Hypertensive urgency 05/23/2013   Pulmonary edema 05/23/2013   Type 2 diabetes mellitus with diabetic chronic kidney  disease ([place]) 05/23/2013   Type 2 diabetes mellitus with hyperosmolar nonketotic hyperglycemia ([place]) 04/13/2013   Acute on chronic diastolic heart failure ([place]) 04/13/2013   Gastrointestinal hemorrhage, unspecified 03/31/2013   Hypokalemia 08/24/2012   Hyperlipidemia associated with type 2 diabetes mellitus ([place]) 12/27/2007   Allergic rhinitis due to pollen 12/27/2007   Unspecified asthma, uncomplicated 45/99/7741    History obtained from: chart review and patient.  Jacqueleen is a 71 y.o. female presenting for a follow up visit.  She was last seen in November 2022.  At that time, her lung testing looked stable.  We recommended Breztri 2  puffs twice daily and gave her a sample.  We sent in montelukast as well. For her allergic rhinitis, we continued with allergy shots at the same schedule. We also continued with levocetirizine as well as montelukast, Flonase, and Astelin.   Since the last visit, she has done well.  Asthma/Respiratory Symptom History: She remains on Breztri two puffs twice daily as well as montelukast.  Review of her lung testing shows that we have never tried to reverse her. At her first visit in May 2021, she had a spirometry that showed only mild restriction. Her main complaints initially centered on her allergic rhinitis symptoms initially. She has been on Symbicort when I first met her for a number of years. She has always felt that her Symbicort helping with her breathing.   She had a CXR that was fairly normal one week ago:   IMPRESSION: 1. No active cardiopulmonary disease. 2. Mild hyperinflation of the lungs with increased AP diameter of the chest, may be associated with chronic obstructive pulmonary disease.   She reports that the coughing has become annoying. She tells me that it is colder in the building, more cold than it used to be. This makes the coughing worse. Sometimes this cough is dry and sometimes it is wet. She will cough up a lot of mucous occasionally. It is not consistent. Of note, she has not been on her Breztri. She does not think that the [place] [employer] ever got the free medication approved. She has been surviving on her samples. She does think that things got worse when she ran out of her medications.   She smoked from 1968 through 2000ish when he mother passed away. She started back smoking around 2013. She intermittent smoked since 2013. It is fairly rare now.    Allergic Rhinitis Symptom History: She remains on Xyzal 5 mg daily as well as Singulair.  She is using Flonase 1 spray per nostril twice daily and Astelin 1 spray per nostril twice daily.  She is maximized on all of her allergy  medications.  Kelsa is on allergen immunotherapy. She receives two injections. Immunotherapy script #1 contains grasses, cat and dog. She currently receives 0.31m of the RED vial (1/100). Immunotherapy script #2 contains molds and dust mites. She currently receives 0.517mof the RED vial (1/100). She started shots July of 2021 and reached maintenance in November 2021.   Food Allergy Symptom History: She thinks that her coughing is related possibly to onions. She also thinks that some other foods might be involved. She never gets hives or throat swelling from these ingestions.  Of note, she is on reflux medication so she does not think that this is related.   GERD Symptom History: She remains on 20 mg daily of omeprazole.  She has never tried using the 40 mg dose.  She has a lot of anxiety and has  some medications for that.   Otherwise, there have been no changes to her past medical history, surgical history, family history, or social history.    Review of Systems  Constitutional: Negative.  Negative for fever, malaise/fatigue and weight loss.  HENT: Negative.  Negative for congestion, ear discharge and ear pain.   Eyes:  Negative for pain, discharge and redness.  Respiratory:  Positive for cough. Negative for sputum production, shortness of breath and wheezing.   Cardiovascular: Negative.  Negative for chest pain and palpitations.  Gastrointestinal:  Negative for abdominal pain, heartburn, nausea and vomiting.  Skin: Negative.  Negative for itching and rash.  Neurological:  Negative for dizziness and headaches.  Endo/Heme/Allergies:  Negative for environmental allergies. Does not bruise/bleed easily.       Objective:   Blood pressure (!) 142/68, pulse 62, temperature 97.8 F (36.6 C), resp. rate 18, height '5\' 5"'  (1.651 m), weight 136 lb 2 oz (61.7 kg), SpO2 94 %. Body mass index is 22.65 kg/m.    Physical Exam Vitals reviewed.  Constitutional:      Appearance: She is  well-developed.     Comments: Very pleasant. Still singing today.   HENT:     Head: Normocephalic and atraumatic.     Right Ear: Tympanic membrane, ear canal and external ear normal.     Left Ear: Tympanic membrane, ear canal and external ear normal.     Nose: No nasal deformity, septal deviation, mucosal edema or rhinorrhea.     Right Turbinates: Enlarged, swollen and pale.     Left Turbinates: Enlarged, swollen and pale.     Right Sinus: No maxillary sinus tenderness or frontal sinus tenderness.     Left Sinus: No maxillary sinus tenderness or frontal sinus tenderness.     Comments: No nasal polyps noted. Clear rhinorrhea.     Mouth/Throat:     Lips: Pink.     Mouth: Mucous membranes are moist. Mucous membranes are not pale and not dry.     Pharynx: Uvula midline.  Eyes:     General: Lids are normal. Allergic shiner present.        Right eye: No discharge.        Left eye: No discharge.     Conjunctiva/sclera: Conjunctivae normal.     Right eye: Right conjunctiva is not injected. No chemosis.    Left eye: Left conjunctiva is not injected. No chemosis.    Pupils: Pupils are equal, round, and reactive to light.  Cardiovascular:     Rate and Rhythm: Normal rate and regular rhythm.     Heart sounds: Normal heart sounds.  Pulmonary:     Effort: Pulmonary effort is normal. No tachypnea, accessory muscle usage or respiratory distress.     Breath sounds: Normal breath sounds. No wheezing, rhonchi or rales.     Comments: Moving air well in all lung fields.  No increased work of breathing. Coarse airway sounds throughout.  Chest:     Chest wall: No tenderness.  Lymphadenopathy:     Cervical: No cervical adenopathy.  Skin:    Coloration: Skin is not pale.     Findings: No abrasion, erythema, petechiae or rash. Rash is not papular, urticarial or vesicular.  Neurological:     Mental Status: She is alert.  Psychiatric:        Behavior: Behavior is cooperative.      Diagnostic  studies:    Spirometry: results abnormal (FEV1: 1.05/54%, FVC: 1.35/54%, FEV1/FVC: 78%).  Spirometry consistent with possible restrictive disease. Xopenex four puffs via MDI treatment given in clinic with no improvement.  Allergy Studies: none       Salvatore Marvel, MD  [facility]

## 2022-02-19 NOTE — Patient Instructions (Addendum)
1. Asthma COPD overlap syndrome - Lung testing looked slightly better today.  - It did improve with the albuterol puffs. - We with continue with the Symbicort and try to get this approved through [facility] [place] [employer] [facility].  - We may have to order a chest CT to get a look at your lung tissue. - I am sure that the smoking is not helping, so I would try to stop that for sure.  - We could consider an injectable medication for your asthma (to help the Symbicort work BETTER).  - Daily controller medication(s): Singulair '10mg'$  daily only + Symbicort two puffs two puffs twice daily - Prior to physical activity: albuterol 2 puffs 10-15 minutes before physical activity. - Rescue medications: albuterol 4 puffs every 4-6 hours as needed or albuterol nebulizer one vial every 4-6 hours as needed - Asthma control goals:  * Full participation in all desired activities (may need albuterol before activity) * Albuterol use two time or less a week on average (not counting use with activity) * Cough interfering with sleep two time or less a month * Oral steroids no more than once a year * No hospitalizations  2. Chronic rhinitis (dust mites, cat, dog, grass, and indoor and outdoor molds) - Contonue with allergy shots at the same schedule.  - Continue with Xyzal '5mg'$  daily. - Continue with Singulair '10mg'$  daily. - Continue with Flonase one spray per nostril twice daily and Astelin one spray per nostril twice daily.   3. Concern for food allergies. - We are going to get some testing for onions as well as the most common foods. - Hopefully they can draw this when they do dialysis.   4. GERD  - We are going to increase your reflux medicine to '40mg'$  daily. - This is not cleared via the kidneys so we should be fine.   5. Return in about 6 weeks (around 04/02/2022).    Please inform us of any Emergency Department visits, hospitalizations, or changes in symptoms. Call us before going to the ED for breathing or allergy symptoms since  we might be able to fit you in for a sick visit. Feel free to contact us anytime with any questions, problems, or concerns.  It was a pleasure to see you again today!  Websites that have reliable patient information: 1. American Academy of Asthma, Allergy, and Immunology: www.aaaai.org 2. Food Allergy Research and Education (FARE): foodallergy.org 3. Mothers of Asthmatics: http://www.asthmacommunitynetwork.org 4. American College of Allergy, Asthma, and Immunology: www.acaai.org   COVID-19 Vaccine Information can be found at: ShippingScam.co.uk For questions related to vaccine distribution or appointments, please email [email or web address] or call [phone].     "Like" [place] on Facebook and Instagram for our latest updates!       Make sure you are registered to vote! If you have moved or changed any of your contact information, you will need to get this updated before voting!  In some cases, you MAY be able to register to vote online: [email or web address]

## 2022-02-20 DIAGNOSIS — E876 Hypokalemia: Secondary | ICD-10-CM | POA: Diagnosis not present

## 2022-02-20 DIAGNOSIS — N186 End stage renal disease: Secondary | ICD-10-CM | POA: Diagnosis not present

## 2022-02-20 DIAGNOSIS — Z992 Dependence on renal dialysis: Secondary | ICD-10-CM | POA: Diagnosis not present

## 2022-02-20 DIAGNOSIS — N2581 Secondary hyperparathyroidism of renal origin: Secondary | ICD-10-CM | POA: Diagnosis not present

## 2022-02-20 DIAGNOSIS — D509 Iron deficiency anemia, unspecified: Secondary | ICD-10-CM | POA: Diagnosis not present

## 2022-02-20 DIAGNOSIS — D631 Anemia in chronic kidney disease: Secondary | ICD-10-CM | POA: Diagnosis not present

## 2022-02-23 DIAGNOSIS — D631 Anemia in chronic kidney disease: Secondary | ICD-10-CM | POA: Diagnosis not present

## 2022-02-23 DIAGNOSIS — N2581 Secondary hyperparathyroidism of renal origin: Secondary | ICD-10-CM | POA: Diagnosis not present

## 2022-02-23 DIAGNOSIS — Z992 Dependence on renal dialysis: Secondary | ICD-10-CM | POA: Diagnosis not present

## 2022-02-23 DIAGNOSIS — E876 Hypokalemia: Secondary | ICD-10-CM | POA: Diagnosis not present

## 2022-02-23 DIAGNOSIS — N186 End stage renal disease: Secondary | ICD-10-CM | POA: Diagnosis not present

## 2022-02-23 DIAGNOSIS — D509 Iron deficiency anemia, unspecified: Secondary | ICD-10-CM | POA: Diagnosis not present

## 2022-02-24 ENCOUNTER — Ambulatory Visit (INDEPENDENT_AMBULATORY_CARE_PROVIDER_SITE_OTHER): Payer: Medicare Other

## 2022-02-24 DIAGNOSIS — J309 Allergic rhinitis, unspecified: Secondary | ICD-10-CM

## 2022-02-24 DIAGNOSIS — I159 Secondary hypertension, unspecified: Secondary | ICD-10-CM | POA: Diagnosis not present

## 2022-02-24 DIAGNOSIS — N186 End stage renal disease: Secondary | ICD-10-CM | POA: Diagnosis not present

## 2022-02-24 DIAGNOSIS — Z992 Dependence on renal dialysis: Secondary | ICD-10-CM | POA: Diagnosis not present

## 2022-02-25 ENCOUNTER — Telehealth: Payer: Self-pay

## 2022-02-25 ENCOUNTER — Encounter: Payer: Self-pay | Admitting: Allergy & Immunology

## 2022-02-25 DIAGNOSIS — Z992 Dependence on renal dialysis: Secondary | ICD-10-CM | POA: Diagnosis not present

## 2022-02-25 DIAGNOSIS — E876 Hypokalemia: Secondary | ICD-10-CM | POA: Diagnosis not present

## 2022-02-25 DIAGNOSIS — D509 Iron deficiency anemia, unspecified: Secondary | ICD-10-CM | POA: Diagnosis not present

## 2022-02-25 DIAGNOSIS — D631 Anemia in chronic kidney disease: Secondary | ICD-10-CM | POA: Diagnosis not present

## 2022-02-25 DIAGNOSIS — N2581 Secondary hyperparathyroidism of renal origin: Secondary | ICD-10-CM | POA: Diagnosis not present

## 2022-02-25 DIAGNOSIS — N186 End stage renal disease: Secondary | ICD-10-CM | POA: Diagnosis not present

## 2022-02-25 NOTE — Telephone Encounter (Addendum)
 -----   Message from Valentina Shaggy, MD sent at 02/25/2022  1:26 PM EDT ----- I ordered a high resolution chest CT NO CONTRAST. Unsure of the PA process.   Per Medicare Part A/B  No PA/ pre certification is required. Per [employer] [place] - No PA or Pre Certification required.  Called [phone] - Scheduling dept was closed - will try to call tomorrow, Thursday, 02/26/22 to schedule CT Chest High Resolution.

## 2022-02-27 DIAGNOSIS — Z992 Dependence on renal dialysis: Secondary | ICD-10-CM | POA: Diagnosis not present

## 2022-02-27 DIAGNOSIS — N2581 Secondary hyperparathyroidism of renal origin: Secondary | ICD-10-CM | POA: Diagnosis not present

## 2022-02-27 DIAGNOSIS — E876 Hypokalemia: Secondary | ICD-10-CM | POA: Diagnosis not present

## 2022-02-27 DIAGNOSIS — D631 Anemia in chronic kidney disease: Secondary | ICD-10-CM | POA: Diagnosis not present

## 2022-02-27 DIAGNOSIS — D509 Iron deficiency anemia, unspecified: Secondary | ICD-10-CM | POA: Diagnosis not present

## 2022-02-27 DIAGNOSIS — N186 End stage renal disease: Secondary | ICD-10-CM | POA: Diagnosis not present

## 2022-03-02 DIAGNOSIS — D509 Iron deficiency anemia, unspecified: Secondary | ICD-10-CM | POA: Diagnosis not present

## 2022-03-02 DIAGNOSIS — D631 Anemia in chronic kidney disease: Secondary | ICD-10-CM | POA: Diagnosis not present

## 2022-03-02 DIAGNOSIS — E876 Hypokalemia: Secondary | ICD-10-CM | POA: Diagnosis not present

## 2022-03-02 DIAGNOSIS — Z992 Dependence on renal dialysis: Secondary | ICD-10-CM | POA: Diagnosis not present

## 2022-03-02 DIAGNOSIS — N186 End stage renal disease: Secondary | ICD-10-CM | POA: Diagnosis not present

## 2022-03-02 DIAGNOSIS — N2581 Secondary hyperparathyroidism of renal origin: Secondary | ICD-10-CM | POA: Diagnosis not present

## 2022-03-03 ENCOUNTER — Ambulatory Visit (INDEPENDENT_AMBULATORY_CARE_PROVIDER_SITE_OTHER): Payer: Medicare Other | Admitting: *Deleted

## 2022-03-03 DIAGNOSIS — J309 Allergic rhinitis, unspecified: Secondary | ICD-10-CM

## 2022-03-03 DIAGNOSIS — K9049 Malabsorption due to intolerance, not elsewhere classified: Secondary | ICD-10-CM | POA: Diagnosis not present

## 2022-03-04 DIAGNOSIS — N186 End stage renal disease: Secondary | ICD-10-CM | POA: Diagnosis not present

## 2022-03-04 DIAGNOSIS — Z992 Dependence on renal dialysis: Secondary | ICD-10-CM | POA: Diagnosis not present

## 2022-03-04 DIAGNOSIS — N2581 Secondary hyperparathyroidism of renal origin: Secondary | ICD-10-CM | POA: Diagnosis not present

## 2022-03-04 DIAGNOSIS — D631 Anemia in chronic kidney disease: Secondary | ICD-10-CM | POA: Diagnosis not present

## 2022-03-04 DIAGNOSIS — E876 Hypokalemia: Secondary | ICD-10-CM | POA: Diagnosis not present

## 2022-03-04 DIAGNOSIS — D509 Iron deficiency anemia, unspecified: Secondary | ICD-10-CM | POA: Diagnosis not present

## 2022-03-06 DIAGNOSIS — E876 Hypokalemia: Secondary | ICD-10-CM | POA: Diagnosis not present

## 2022-03-06 DIAGNOSIS — D509 Iron deficiency anemia, unspecified: Secondary | ICD-10-CM | POA: Diagnosis not present

## 2022-03-06 DIAGNOSIS — Z992 Dependence on renal dialysis: Secondary | ICD-10-CM | POA: Diagnosis not present

## 2022-03-06 DIAGNOSIS — N2581 Secondary hyperparathyroidism of renal origin: Secondary | ICD-10-CM | POA: Diagnosis not present

## 2022-03-06 DIAGNOSIS — D631 Anemia in chronic kidney disease: Secondary | ICD-10-CM | POA: Diagnosis not present

## 2022-03-06 DIAGNOSIS — N186 End stage renal disease: Secondary | ICD-10-CM | POA: Diagnosis not present

## 2022-03-06 LAB — ALLERGEN PROFILE, BASIC FOOD
Allergen Corn, IgE: <0.1 kU/L
Beef IgE: <0.1 kU/L
Chocolate/Cacao IgE: <0.1 kU/L
Egg, Whole IgE: <0.1 kU/L
Food Mix (Seafoods) IgE: NEGATIVE
Milk IgE: 0.13 kU/L — AB
Peanut IgE: <0.1 kU/L
Pork IgE: <0.1 kU/L
Soybean IgE: <0.1 kU/L
Wheat IgE: <0.1 kU/L

## 2022-03-06 LAB — ALLERGEN, ONION, F48: Allergen Onion IgE: <0.1 kU/L

## 2022-03-09 DIAGNOSIS — D631 Anemia in chronic kidney disease: Secondary | ICD-10-CM | POA: Diagnosis not present

## 2022-03-09 DIAGNOSIS — Z992 Dependence on renal dialysis: Secondary | ICD-10-CM | POA: Diagnosis not present

## 2022-03-09 DIAGNOSIS — D509 Iron deficiency anemia, unspecified: Secondary | ICD-10-CM | POA: Diagnosis not present

## 2022-03-09 DIAGNOSIS — N2581 Secondary hyperparathyroidism of renal origin: Secondary | ICD-10-CM | POA: Diagnosis not present

## 2022-03-09 DIAGNOSIS — E876 Hypokalemia: Secondary | ICD-10-CM | POA: Diagnosis not present

## 2022-03-09 DIAGNOSIS — N186 End stage renal disease: Secondary | ICD-10-CM | POA: Diagnosis not present

## 2022-03-10 DIAGNOSIS — Z66 Do not resuscitate: Secondary | ICD-10-CM | POA: Diagnosis not present

## 2022-03-10 DIAGNOSIS — I132 Hypertensive heart and chronic kidney disease with heart failure and with stage 5 chronic kidney disease, or end stage renal disease: Secondary | ICD-10-CM | POA: Diagnosis not present

## 2022-03-10 DIAGNOSIS — N186 End stage renal disease: Secondary | ICD-10-CM | POA: Diagnosis not present

## 2022-03-10 DIAGNOSIS — J309 Allergic rhinitis, unspecified: Secondary | ICD-10-CM | POA: Diagnosis not present

## 2022-03-10 DIAGNOSIS — E1122 Type 2 diabetes mellitus with diabetic chronic kidney disease: Secondary | ICD-10-CM | POA: Diagnosis not present

## 2022-03-10 DIAGNOSIS — Z992 Dependence on renal dialysis: Secondary | ICD-10-CM | POA: Diagnosis not present

## 2022-03-10 DIAGNOSIS — Z0000 Encounter for general adult medical examination without abnormal findings: Secondary | ICD-10-CM | POA: Diagnosis not present

## 2022-03-10 DIAGNOSIS — E113599 Type 2 diabetes mellitus with proliferative diabetic retinopathy without macular edema, unspecified eye: Secondary | ICD-10-CM | POA: Diagnosis not present

## 2022-03-10 DIAGNOSIS — Z7189 Other specified counseling: Secondary | ICD-10-CM | POA: Diagnosis not present

## 2022-03-10 DIAGNOSIS — Z6821 Body mass index (BMI) 21.0-21.9, adult: Secondary | ICD-10-CM | POA: Diagnosis not present

## 2022-03-10 DIAGNOSIS — Z79899 Other long term (current) drug therapy: Secondary | ICD-10-CM | POA: Diagnosis not present

## 2022-03-11 DIAGNOSIS — N2581 Secondary hyperparathyroidism of renal origin: Secondary | ICD-10-CM | POA: Diagnosis not present

## 2022-03-11 DIAGNOSIS — D509 Iron deficiency anemia, unspecified: Secondary | ICD-10-CM | POA: Diagnosis not present

## 2022-03-11 DIAGNOSIS — D631 Anemia in chronic kidney disease: Secondary | ICD-10-CM | POA: Diagnosis not present

## 2022-03-11 DIAGNOSIS — Z992 Dependence on renal dialysis: Secondary | ICD-10-CM | POA: Diagnosis not present

## 2022-03-11 DIAGNOSIS — N186 End stage renal disease: Secondary | ICD-10-CM | POA: Diagnosis not present

## 2022-03-11 DIAGNOSIS — E876 Hypokalemia: Secondary | ICD-10-CM | POA: Diagnosis not present

## 2022-03-13 DIAGNOSIS — D631 Anemia in chronic kidney disease: Secondary | ICD-10-CM | POA: Diagnosis not present

## 2022-03-13 DIAGNOSIS — E876 Hypokalemia: Secondary | ICD-10-CM | POA: Diagnosis not present

## 2022-03-13 DIAGNOSIS — D509 Iron deficiency anemia, unspecified: Secondary | ICD-10-CM | POA: Diagnosis not present

## 2022-03-13 DIAGNOSIS — Z992 Dependence on renal dialysis: Secondary | ICD-10-CM | POA: Diagnosis not present

## 2022-03-13 DIAGNOSIS — N186 End stage renal disease: Secondary | ICD-10-CM | POA: Diagnosis not present

## 2022-03-13 DIAGNOSIS — N2581 Secondary hyperparathyroidism of renal origin: Secondary | ICD-10-CM | POA: Diagnosis not present

## 2022-03-16 DIAGNOSIS — D631 Anemia in chronic kidney disease: Secondary | ICD-10-CM | POA: Diagnosis not present

## 2022-03-16 DIAGNOSIS — Z992 Dependence on renal dialysis: Secondary | ICD-10-CM | POA: Diagnosis not present

## 2022-03-16 DIAGNOSIS — N2581 Secondary hyperparathyroidism of renal origin: Secondary | ICD-10-CM | POA: Diagnosis not present

## 2022-03-16 DIAGNOSIS — D509 Iron deficiency anemia, unspecified: Secondary | ICD-10-CM | POA: Diagnosis not present

## 2022-03-16 DIAGNOSIS — E876 Hypokalemia: Secondary | ICD-10-CM | POA: Diagnosis not present

## 2022-03-16 DIAGNOSIS — N186 End stage renal disease: Secondary | ICD-10-CM | POA: Diagnosis not present

## 2022-03-18 DIAGNOSIS — E876 Hypokalemia: Secondary | ICD-10-CM | POA: Diagnosis not present

## 2022-03-18 DIAGNOSIS — D509 Iron deficiency anemia, unspecified: Secondary | ICD-10-CM | POA: Diagnosis not present

## 2022-03-18 DIAGNOSIS — Z992 Dependence on renal dialysis: Secondary | ICD-10-CM | POA: Diagnosis not present

## 2022-03-18 DIAGNOSIS — N2581 Secondary hyperparathyroidism of renal origin: Secondary | ICD-10-CM | POA: Diagnosis not present

## 2022-03-18 DIAGNOSIS — D631 Anemia in chronic kidney disease: Secondary | ICD-10-CM | POA: Diagnosis not present

## 2022-03-18 DIAGNOSIS — N186 End stage renal disease: Secondary | ICD-10-CM | POA: Diagnosis not present

## 2022-03-20 DIAGNOSIS — D631 Anemia in chronic kidney disease: Secondary | ICD-10-CM | POA: Diagnosis not present

## 2022-03-20 DIAGNOSIS — N186 End stage renal disease: Secondary | ICD-10-CM | POA: Diagnosis not present

## 2022-03-20 DIAGNOSIS — E876 Hypokalemia: Secondary | ICD-10-CM | POA: Diagnosis not present

## 2022-03-20 DIAGNOSIS — Z992 Dependence on renal dialysis: Secondary | ICD-10-CM | POA: Diagnosis not present

## 2022-03-20 DIAGNOSIS — D509 Iron deficiency anemia, unspecified: Secondary | ICD-10-CM | POA: Diagnosis not present

## 2022-03-20 DIAGNOSIS — N2581 Secondary hyperparathyroidism of renal origin: Secondary | ICD-10-CM | POA: Diagnosis not present

## 2022-03-23 DIAGNOSIS — N2581 Secondary hyperparathyroidism of renal origin: Secondary | ICD-10-CM | POA: Diagnosis not present

## 2022-03-23 DIAGNOSIS — D509 Iron deficiency anemia, unspecified: Secondary | ICD-10-CM | POA: Diagnosis not present

## 2022-03-23 DIAGNOSIS — E876 Hypokalemia: Secondary | ICD-10-CM | POA: Diagnosis not present

## 2022-03-23 DIAGNOSIS — N186 End stage renal disease: Secondary | ICD-10-CM | POA: Diagnosis not present

## 2022-03-23 DIAGNOSIS — D631 Anemia in chronic kidney disease: Secondary | ICD-10-CM | POA: Diagnosis not present

## 2022-03-23 DIAGNOSIS — Z992 Dependence on renal dialysis: Secondary | ICD-10-CM | POA: Diagnosis not present

## 2022-03-24 ENCOUNTER — Ambulatory Visit (INDEPENDENT_AMBULATORY_CARE_PROVIDER_SITE_OTHER): Payer: Medicare Other | Admitting: *Deleted

## 2022-03-24 DIAGNOSIS — J309 Allergic rhinitis, unspecified: Secondary | ICD-10-CM

## 2022-03-24 DIAGNOSIS — Z1231 Encounter for screening mammogram for malignant neoplasm of breast: Secondary | ICD-10-CM | POA: Diagnosis not present

## 2022-03-25 ENCOUNTER — Telehealth: Payer: Self-pay | Admitting: *Deleted

## 2022-03-25 DIAGNOSIS — D509 Iron deficiency anemia, unspecified: Secondary | ICD-10-CM | POA: Diagnosis not present

## 2022-03-25 DIAGNOSIS — E876 Hypokalemia: Secondary | ICD-10-CM | POA: Diagnosis not present

## 2022-03-25 DIAGNOSIS — D631 Anemia in chronic kidney disease: Secondary | ICD-10-CM | POA: Diagnosis not present

## 2022-03-25 DIAGNOSIS — N2581 Secondary hyperparathyroidism of renal origin: Secondary | ICD-10-CM | POA: Diagnosis not present

## 2022-03-25 DIAGNOSIS — Z992 Dependence on renal dialysis: Secondary | ICD-10-CM | POA: Diagnosis not present

## 2022-03-25 DIAGNOSIS — N186 End stage renal disease: Secondary | ICD-10-CM | POA: Diagnosis not present

## 2022-03-25 NOTE — Chronic Care Management (AMB) (Signed)
  Care Coordination   Note   03/25/2022 Name: JEWELIANA DUDGEON MRN: [id number] DOB: [birth year]  [name] is a 71 y.o. year old female who sees Cipriano Mile, NP for primary care. I reached out to [name] by phone today to offer care coordination services.  Ms. Husband was given information about Care Coordination services today including:   The Care Coordination services include support from the care team which includes your Nurse Coordinator, Clinical Social Worker, or Pharmacist.  The Care Coordination team is here to help remove barriers to the health concerns and goals most important to you. Care Coordination services are voluntary, and the patient may decline or stop services at any time by request to their care team member.   Care Coordination Consent Status: Patient agreed to services and verbal consent obtained.   Follow up plan:  Telephone appointment with care coordination team member scheduled for:  04/02/22  Encounter Outcome:  Pt. Scheduled

## 2022-03-26 ENCOUNTER — Encounter (INDEPENDENT_AMBULATORY_CARE_PROVIDER_SITE_OTHER): Payer: Self-pay | Admitting: Ophthalmology

## 2022-03-26 ENCOUNTER — Ambulatory Visit (INDEPENDENT_AMBULATORY_CARE_PROVIDER_SITE_OTHER): Payer: Medicare Other | Admitting: Ophthalmology

## 2022-03-26 ENCOUNTER — Other Ambulatory Visit: Payer: Medicare Other

## 2022-03-26 DIAGNOSIS — E113512 Type 2 diabetes mellitus with proliferative diabetic retinopathy with macular edema, left eye: Secondary | ICD-10-CM | POA: Diagnosis not present

## 2022-03-26 DIAGNOSIS — E113511 Type 2 diabetes mellitus with proliferative diabetic retinopathy with macular edema, right eye: Secondary | ICD-10-CM | POA: Diagnosis not present

## 2022-03-26 DIAGNOSIS — H35371 Puckering of macula, right eye: Secondary | ICD-10-CM | POA: Diagnosis not present

## 2022-03-26 MED ORDER — BEVACIZUMAB CHEMO INJECTION 1.25MG/0.05ML SYRINGE FOR KALEIDOSCOPE
1.2500 mg | INTRAVITREAL | Status: AC | PRN
  Administered 2022-03-26: 1.25 mg via INTRAVITREAL

## 2022-03-26 NOTE — Assessment & Plan Note (Signed)
Stable OD.  Yet the macular edema will not resolve until epiretinal membrane is addressed

## 2022-03-26 NOTE — Progress Notes (Signed)
03/26/2022     CHIEF COMPLAINT Patient presents for  Chief Complaint  Patient presents with   Diabetic Retinopathy with Macular Edema      HISTORY OF PRESENT ILLNESS: Destiny Day is a 71 y.o. female who presents to the clinic today for:   HPI   6 week dilate os eylea oct Pt states her vision has been stable Pt denies any new floaters or FOL  Last edited by Morene Rankins, CMA on 03/26/2022  9:29 AM.      Referring physician: Cipriano Mile, NP [place],  [place] [id number]  HISTORICAL INFORMATION:   Selected notes from the MEDICAL RECORD NUMBER    Lab Results  Component Value Date   HGBA1C 6.4 (H) 08/04/2021     CURRENT MEDICATIONS: Current Outpatient Medications (Ophthalmic Drugs)  Medication Sig   latanoprost (XALATAN) 0.005 % ophthalmic solution Place 1 drop into the left eye at bedtime.   No current facility-administered medications for this visit. (Ophthalmic Drugs)   Current Outpatient Medications (Other)  Medication Sig   albuterol (PROAIR HFA) 108 (90 Base) MCG/ACT inhaler Inhale 2 puffs into the lungs every 4 (four) hours as needed for wheezing or shortness of breath.   amLODipine (NORVASC) 5 MG tablet Take by mouth.   aspirin EC 81 MG tablet Take 81 mg by mouth daily.   atorvastatin (LIPITOR) 80 MG tablet Take 80 mg by mouth at bedtime.   busPIRone (BUSPAR) 7.5 MG tablet Take 7.5 mg by mouth 2 (two) times daily as needed (anxiety).   carvedilol (COREG) 12.5 MG tablet Take 12.5 mg by mouth See admin instructions. Take 1 tablet (12.5 mg) by mouth once daily in the evening on Mondays, Wednesdays, & Fridays. (Dialysis) Take 1 tablet (12.5 mg) by mouth twice daily on Sundays, Tuesdays, Thursdays, & Saturdays. (Non Dialysis)   cinacalcet (SENSIPAR) 90 MG tablet Take 90 mg by mouth See admin instructions. Take 1 tablet (90 mg) by mouth in the evening on Mondays, Wednesdays, & Fridays (dialysis) Take 1 tablet (90 mg) by mouth twice daily  on Sundays, Tuesdays, Thursdays, Saturdays. (non dialysis)   Doxercalciferol (HECTOROL IV) Doxercalciferol (Hectorol)   EPINEPHrine (AUVI-Q) 0.3 mg/0.3 mL IJ SOAJ injection Inject 0.3 mg into the muscle as needed for anaphylaxis.   ferric citrate (AURYXIA) 1 GM 210 MG(Fe) tablet Take 210-420 mg by mouth See admin instructions. Take 2 tablets (420 mg) by mouth with each meal & take 1 tablet (210 mg) by mouth with snacks.   fluticasone (FLONASE) 50 MCG/ACT nasal spray Place 2 sprays into both nostrils daily as needed for allergies or rhinitis.   fluticasone-salmeterol (ADVAIR HFA) 115-21 MCG/ACT inhaler Inhale two puffs twice daily   gabapentin (NEURONTIN) 100 MG capsule TAKE 1 CAPSULE (100 MG TOTAL) BY MOUTH AT BEDTIME. WHEN NECESSARY FOR NEUROPATHY PAIN (Patient taking differently: Take 100 mg by mouth at bedtime as needed (pain). When necessary for neuropathy pain)   glipiZIDE (GLUCOTROL) 5 MG tablet Take 5 mg by mouth daily before breakfast.   guaiFENesin-codeine 100-10 MG/5ML syrup Take 5 mLs by mouth 3 (three) times daily as needed for cough.   isosorbide-hydrALAZINE (BIDIL) 20-37.5 MG tablet Take 1 tablet by mouth See admin instructions. hs on Monday,Wednesday,Friday (dialysis days) Tid on Tuesday,Thursday,Saturday,Sunday(non-dialysis days)   lidocaine-prilocaine (EMLA) cream Apply 1 application topically 3 (three) times a week. 1-2 hours prior to dialysis   loperamide (IMODIUM) 2 MG capsule Take 2 capsules (4 mg total) by mouth 3 (three) times daily  as needed for diarrhea or loose stools.   Methoxy PEG-Epoetin Beta (MIRCERA IJ) Mircera   montelukast (SINGULAIR) 10 MG tablet TAKE 1 TABLET BY MOUTH EVERYDAY AT BEDTIME   omeprazole (PRILOSEC) 20 MG capsule Take 20 mg by mouth daily.    ondansetron (ZOFRAN) 4 MG tablet Take 1 tablet (4 mg total) by mouth every 6 (six) hours as needed for nausea.   polyethylene glycol (MIRALAX / GLYCOLAX) 17 g packet Take 17 g by mouth daily. (Patient taking  differently: Take 17 g by mouth daily as needed for mild constipation.)   Spacer/Aero-Holding Chambers (AEROCHAMBER PLUS) inhaler Use with inhaler   traZODone (DESYREL) 50 MG tablet Take 50 mg by mouth at bedtime as needed for sleep.   Vitamin D, Ergocalciferol, (DRISDOL) 1.25 MG (50000 UT) CAPS capsule Take 50,000 Units by mouth every Sunday. Takes on Sunday   No current facility-administered medications for this visit. (Other)      REVIEW OF SYSTEMS: ROS   Negative for: Constitutional, Gastrointestinal, Neurological, Skin, Genitourinary, Musculoskeletal, HENT, Endocrine, Cardiovascular, Eyes, Respiratory, Psychiatric, Allergic/Imm, Heme/Lymph Last edited by Morene Rankins, CMA on 03/26/2022  9:29 AM.       ALLERGIES Allergies  Allergen Reactions   Adhesive  [Tape] Hives   Lisinopril Cough   Prednisone Swelling and Other (See Comments)    Excessive fluid buildup    PAST MEDICAL HISTORY Past Medical History:  Diagnosis Date   Abdominal bruit    Anxiety    Arthritis    Osteoarthritis   Asthma    Cervical disc disease    "pinced nerve"   CHF (congestive heart failure) ([place])    Diabetes mellitus    Type II   Diverticulitis    ESRD (end stage renal disease) ([place])    dialysis - M/W/F- [place]   GERD (gastroesophageal reflux disease)    from medications   GI bleed 03/31/2013   Head injury 07/2017   History of hiatal hernia    Hyperlipidemia    Hypertension    Neuropathy    left leg   Nuclear sclerotic cataract of right eye 04/23/2020   Osteoporosis    Peripheral vascular disease ([place])    Pneumonia    "very young" and a few years ago   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)    Vitrectomy membrane peel endolaser 05-07-2021   Seasonal allergies    Shortness of breath dyspnea    WIth exertion, when fluid builds   Sleep apnea    can't afford cpap    Vitreomacular traction syndrome, right  08/22/2020   05-07-2021  Vitrectomy, membrane peel, PRP, resection posterior segment of NVD, remnants of peripheral NVE remain in place due to atrophic retina, no vitreous substitute   Past Surgical History:  Procedure Laterality Date   A/V SHUNTOGRAM N/A 09/22/2016   Procedure: A/V Shuntogram - left arm;  Surgeon: Serafina Mitchell, MD;  Location: [place] [facility];  Service: Cardiovascular;  Laterality: N/A;   A/V SHUNTOGRAM N/A 03/22/2018   Procedure: A/V SHUNTOGRAM - left arm;  Surgeon: Serafina Mitchell, MD;  Location: [place] [facility];  Service: Cardiovascular;  Laterality: N/A;   A/V SHUNTOGRAM Left 11/17/2018   Procedure: A/V SHUNTOGRAM;  Surgeon: Angelia Mould, MD;  Location: [facility];  Service: Cardiovascular;  Laterality: Left;   ABDOMINAL HYSTERECTOMY  1993`   AMPUTATION Left 09/01/2016   Procedure: LEFT FOOT TRANSMETATARSAL AMPUTATION;  Surgeon: Meridee Score  V, MD;  Location: [facility];  Service: Orthopedics;  Laterality: Left;   AMPUTATION Right 08/11/2017   Procedure: RIGHT GREAT TOE AMPUTATION DIGIT;  Surgeon: Rosetta Posner, MD;  Location: [place];  Service: Vascular;  Laterality: Right;   AMPUTATION Right 12/31/2017   Procedure: RIGHT TRANSMETATARSAL AMPUTATION;  Surgeon: Newt Minion, MD;  Location: [facility];  Service: Orthopedics;  Laterality: Right;   AV FISTULA PLACEMENT Left 04/21/2016   Procedure: INSERTION OF ARTERIOVENOUS (AV) GORE-TEX GRAFT ARM LEFT;  Surgeon: Elam Dutch, MD;  Location: [place];  Service: Vascular;  Laterality: Left;   BASCILIC VEIN TRANSPOSITION Left 07/10/2014   Procedure: BASCILIC VEIN TRANSPOSITION;  Surgeon: Angelia Mould, MD;  Location: [facility];  Service: Vascular;  Laterality: Left;   [place] Right 11/08/2014   Procedure: FIRST STAGE BASILIC VEIN TRANSPOSITION;  Surgeon: Angelia Mould, MD;  Location: [place];  Service: Vascular;  Laterality: Right;   [place] Right 01/18/2015   Procedure:  SECOND STAGE BASILIC VEIN TRANSPOSITION;  Surgeon: Angelia Mould, MD;  Location: [place];  Service: Vascular;  Laterality: Right;   CRANIOTOMY N/A 08/23/2017   Procedure: CRANIOTOMY HEMATOMA EVACUATION SUBDURAL;  Surgeon: Ashok Pall, MD;  Location: [place];  Service: Neurosurgery;  Laterality: N/A;   CRANIOTOMY Left 08/24/2017   Procedure: CRANIOTOMY FOR RECURRENT ACUTE SUBDURAL HEMATOMA;  Surgeon: Ashok Pall, MD;  Location: [place];  Service: Neurosurgery;  Laterality: Left;   ESOPHAGOGASTRODUODENOSCOPY N/A 03/31/2013   Procedure: ESOPHAGOGASTRODUODENOSCOPY (EGD);  Surgeon: Gatha Mayer, MD;  [facility] ENDOSCOPY;  Service: Endoscopy;  Laterality: N/A;   EYE SURGERY     laser surgery   FEMORAL-POPLITEAL BYPASS GRAFT Left 07/08/2016   Procedure: LEFT  FEMORAL-BELOW KNEE POPLITEAL ARTERY BYPASS GRAFT USING 6MM X 80 CM PROPATEN GORETEX GRAFT WITH RINGS.;  Surgeon: Rosetta Posner, MD;  [facility];  Service: Vascular;  Laterality: Left;   FEMORAL-POPLITEAL BYPASS GRAFT Right 08/11/2017   Procedure: RIGHT FEMORAL TO BELOW KNEE POPLITEAL ARTERKY  BYPASS GRAFT USING 6MM RINGED PROPATEN GRAFT;  Surgeon: Rosetta Posner, MD;  Location: [place];  Service: Vascular;  Laterality: Right;   FISTULOGRAM Left 10/29/2014   Procedure: FISTULOGRAM;  Surgeon: Angelia Mould, MD;  Location: [facility] CATH LAB;  Service: Cardiovascular;  Laterality: Left;   GAS/FLUID EXCHANGE Left 12/20/2018   Procedure: AIR GAS EXCHANGE;  Surgeon: Jalene Mullet, MD;  Location: [place];  Service: Ophthalmology;  Laterality: Left;   INSERTION OF DIALYSIS CATHETER N/A 09/05/2020   Procedure: INSERTION OF DIALYSIS CATHETER;  Surgeon: Cherre Robins, MD;  Location: [place];  Service: Vascular;  Laterality: N/A;   INSERTION OF DIALYSIS CATHETER Left 03/13/2021   Procedure: INSERTION OF DIALYSIS CATHETER;  Surgeon: Waynetta Sandy, MD;  Location: [place];  Service: Vascular;  Laterality: Left;   KNEE ARTHROSCOPY Right 05/06/2018    Procedure: RIGHT KNEE ARTHROSCOPY;  Surgeon: Newt Minion, MD;  Location: [place];  Service: Orthopedics;  Laterality: Right;   KNEE ARTHROSCOPY Right 06/10/2018   Procedure: RIGHT KNEE ARTHROSCOPY AND DEBRIDEMENT;  Surgeon: Newt Minion, MD;  Location: [place];  Service: Orthopedics;  Laterality: Right;   LIGATION OF ARTERIOVENOUS  FISTULA Left 04/21/2016   Procedure: LIGATION OF ARTERIOVENOUS  FISTULA LEFT ARM;  Surgeon: Elam Dutch, MD;  Location: [place];  Service: Vascular;  Laterality: Left;   LOWER EXTREMITY ANGIOGRAPHY N/A 04/06/2017   Procedure: Lower Extremity Angiography - Right;  Surgeon: Serafina Mitchell,  MD;  Location: [place] [facility];  Service: Cardiovascular;  Laterality: N/A;   MEMBRANE PEEL Left 12/20/2018   Procedure: MEMBRANE PEEL;  Surgeon: Jalene Mullet, MD;  Location: [place];  Service: Ophthalmology;  Laterality: Left;   PATCH ANGIOPLASTY Right 01/18/2015   Procedure: BASILIC VEIN PATCH ANGIOPLASTY USING VASCUGUARD PATCH;  Surgeon: Angelia Mould, MD;  Location: [place];  Service: Vascular;  Laterality: Right;   PERIPHERAL VASCULAR BALLOON ANGIOPLASTY  09/22/2016   Procedure: Peripheral Vascular Balloon Angioplasty;  Surgeon: Serafina Mitchell, MD;  Location: [place] [facility];  Service: Cardiovascular;;  Lt. Fistula   PERIPHERAL VASCULAR BALLOON ANGIOPLASTY Left 03/22/2018   Procedure: PERIPHERAL VASCULAR BALLOON ANGIOPLASTY;  Surgeon: Serafina Mitchell, MD;  Location: [place] [facility];  Service: Cardiovascular;  Laterality: Left;  Arm shunt   PERIPHERAL VASCULAR CATHETERIZATION N/A 06/23/2016   Procedure: Abdominal Aortogram w/Lower Extremity;  Surgeon: Serafina Mitchell, MD;  Location: [place] [facility];  Service: Cardiovascular;  Laterality: N/A;   PERIPHERAL VASCULAR CATHETERIZATION  06/23/2016   Procedure: Peripheral Vascular Intervention;  Surgeon: Serafina Mitchell, MD;  Location: [place] [facility];  Service: Cardiovascular;;  lt common and external illiac  artery   PHOTOCOAGULATION WITH LASER Left 12/20/2018   Procedure: PHOTOCOAGULATION WITH LASER;  Surgeon: Jalene Mullet, MD;  Location: [place];  Service: Ophthalmology;  Laterality: Left;   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   Procedure: REPAIR OF COMPLEX TRACTION RETINAL DETACHMENT;  Surgeon: Jalene Mullet, MD;  Location: [place];  Service: Ophthalmology;  Laterality: Left;   REVISION OF ARTERIOVENOUS GORETEX GRAFT Left 09/05/2020   Procedure: REVISION OF ARTERIOVENOUS GORETEX GRAFT LEFT;  Surgeon: Cherre Robins, MD;  Location: [facility] OR;  Service: Vascular;  Laterality: Left;    FAMILY HISTORY Family History  Problem Relation Age of Onset   Other Mother        not sure of cause of death   Diabetes Father    Diabetes Sister    Diabetes Sister    Pancreatic cancer Maternal Grandmother    Colon cancer Neg Hx     SOCIAL HISTORY Social History   Tobacco Use   Smoking status: Former    Packs/day: 0.35    Years: 40.00    Total pack years: 14.00    Types: Cigarettes    Quit date: 05/10/2012    Years since quitting: 9.8   Smokeless tobacco: Never  Vaping Use   Vaping Use: Never used  Substance Use Topics   Alcohol use: No   Drug use: No    Comment: marijuana; quit in early 1980's         OPHTHALMIC EXAM:  Base Eye Exam     Visual Acuity (ETDRS)       Right Left   Dist [place] 20/60 +1 20/100 -1         Tonometry (Tonopen, 9:35 AM)       Right Left   Pressure 8 14         Pupils       Pupils APD   Right PERRL None   Left PERRL None         Extraocular Movement       Right Left    Ortho Ortho    -- -- --  --  --  -- -- --   -- -- --  --  --  -- -- --  Neuro/Psych     Oriented x3: Yes   Mood/Affect: Normal         Dilation     Left eye: 2.5% Phenylephrine, 1.0% Mydriacyl @ 9:30 AM           Slit Lamp and Fundus Exam     External Exam       Right Left   External Normal Normal          Slit Lamp Exam       Right Left   Lids/Lashes Normal Normal   Conjunctiva/Sclera White and quiet White and quiet   Cornea Clear Clear   Anterior Chamber Deep and quiet Deep and quiet   Iris Round and reactive Round and reactive   Lens Centered posterior chamber intraocular lens Centered posterior chamber intraocular lens   Anterior Vitreous Normal Normal, remnant         Fundus Exam       Right Left   Posterior Vitreous  Clear, avitric   Disc  1+ Pallor   C/D Ratio  0.5   Macula  Severe clinically significant macular edema appears to emanate from region temporal aspect of the fovea with large cluster of microaneurysms, Exudates   Vessels  Clear resection of previous fibrovascular disease clear avitric   Periphery  Good peripheral PRP            IMAGING AND PROCEDURES  Imaging and Procedures for 03/26/22  OCT, Retina - OU - Both Eyes       Right Eye Quality was good. Scan locations included subfoveal. Central Foveal Thickness: 578. Progression has worsened. Findings include abnormal foveal contour, epiretinal membrane.   Left Eye Quality was good. Scan locations included subfoveal. Central Foveal Thickness: 560. Progression has improved.   Notes OD with residual ILM tortuosity contributing to the continued thickening OD will likely need vitrectomy membrane peel OD, with severe white epiretinal fibrosis over the fovea, if visual acuity affected  OS with persistent and increasing CSME, will need antivegF therapy today to maintain, and slightly improving today on Eylea     Intravitreal Injection, Pharmacologic Agent - OS - Left Eye       Time Out 03/26/2022. 10:22 AM. Confirmed correct patient, procedure, site, and patient consented.   Anesthesia Topical anesthesia was used. Anesthetic medications included Lidocaine 4%.   Procedure Preparation included 5% betadine to ocular surface, 10% betadine to eyelids, Tobramycin 0.3%. A 30 gauge needle was used.    Injection: 1.25 mg Bevacizumab 1.'25mg'$ /0.50m   Route: Intravitreal, Site: Left Eye   NDC: [id number] Lot:: [id number] Expiration date: 06/10/2022   Post-op Post injection exam found visual acuity of at least counting fingers. The patient tolerated the procedure well. There were no complications. The patient received written and verbal post procedure care education. Post injection medications were not given.              ASSESSMENT/PLAN:  Diabetic macular edema of right eye with proliferative retinopathy associated with type 2 diabetes mellitus (HCC) Stable OD.  Yet the macular edema will not resolve until epiretinal membrane is addressed  Macular pucker, right eye Next follow-up visit, we will can discuss [facility]     ICD-10-CM   1. 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 type 2 diabetes mellitus (HCC)  [id number], Retina - OU - Both Eyes    Intravitreal Injection, Pharmacologic Agent - OS - Left Eye  Bevacizumab (AVASTIN) SOLN 1.25 mg    2. 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 Macular pucker, right eye  H35.371       1.  Diabetic CSME OS is improving.  On intravitreal Eylea today at 6-week interval.  Repeat injection today reevaluate next in 8 weeks  2.  Next visit dilate OU and consider discussion of vitrectomy membrane peel right eye [place], operating room  3.  Ophthalmic Meds Ordered this visit:  Meds ordered this encounter  Medications   Bevacizumab (AVASTIN) SOLN 1.25 mg       Return in about 8 weeks (around 05/21/2022) for OS, EYLEA OCT, DILATE OU.  There are no Patient Instructions on file for this visit.   Explained the diagnoses, plan, and follow up with the patient and they expressed understanding.  Patient expressed understanding of the importance  of proper follow up care.   Clent Demark Mylz Yuan M.D. Diseases & Surgery of the Retina and Vitreous Retina & Diabetic [place] 03/26/22     Abbreviations: M myopia (nearsighted); A astigmatism; H hyperopia (farsighted); P presbyopia; Mrx spectacle prescription;  CTL contact lenses; OD right eye; OS left eye; OU both eyes  XT exotropia; ET esotropia; PEK punctate epithelial keratitis; PEE punctate epithelial erosions; DES dry eye syndrome; MGD meibomian gland dysfunction; ATs artificial tears; PFAT's preservative free artificial tears; [place] nuclear sclerotic cataract; PSC posterior subcapsular cataract; ERM epi-retinal membrane; PVD posterior vitreous detachment; RD retinal detachment; DM diabetes mellitus; DR diabetic retinopathy; NPDR non-proliferative diabetic retinopathy; PDR proliferative diabetic retinopathy; CSME clinically significant macular edema; DME diabetic macular edema; dbh dot blot hemorrhages; CWS cotton wool spot; POAG primary open angle glaucoma; C/D cup-to-disc ratio; HVF humphrey visual field; GVF goldmann visual field; OCT optical coherence tomography; IOP intraocular pressure; BRVO Branch retinal vein occlusion; CRVO central retinal vein occlusion; CRAO central retinal artery occlusion; BRAO branch retinal artery occlusion; RT retinal tear; SB scleral buckle; PPV pars plana vitrectomy; VH Vitreous hemorrhage; PRP panretinal laser photocoagulation; IVK intravitreal kenalog; VMT vitreomacular traction; MH Macular hole;  NVD neovascularization of the disc; NVE neovascularization elsewhere; AREDS age related eye disease study; ARMD age related macular degeneration; POAG primary open angle glaucoma; EBMD epithelial/anterior basement membrane dystrophy; ACIOL anterior chamber intraocular lens; IOL intraocular lens; PCIOL posterior chamber intraocular lens; Phaco/IOL phacoemulsification with intraocular lens placement; [place] photorefractive keratectomy; LASIK laser assisted in situ keratomileusis;  HTN hypertension; DM diabetes mellitus; COPD chronic obstructive pulmonary disease

## 2022-03-26 NOTE — Assessment & Plan Note (Signed)
Next follow-up visit, we will can discuss consideration of scheduling vitrectomy ILM peel at [facility] operating room

## 2022-03-27 DIAGNOSIS — D631 Anemia in chronic kidney disease: Secondary | ICD-10-CM | POA: Diagnosis not present

## 2022-03-27 DIAGNOSIS — N2581 Secondary hyperparathyroidism of renal origin: Secondary | ICD-10-CM | POA: Diagnosis not present

## 2022-03-27 DIAGNOSIS — I159 Secondary hypertension, unspecified: Secondary | ICD-10-CM | POA: Diagnosis not present

## 2022-03-27 DIAGNOSIS — D509 Iron deficiency anemia, unspecified: Secondary | ICD-10-CM | POA: Diagnosis not present

## 2022-03-27 DIAGNOSIS — N186 End stage renal disease: Secondary | ICD-10-CM | POA: Diagnosis not present

## 2022-03-27 DIAGNOSIS — E876 Hypokalemia: Secondary | ICD-10-CM | POA: Diagnosis not present

## 2022-03-27 DIAGNOSIS — Z992 Dependence on renal dialysis: Secondary | ICD-10-CM | POA: Diagnosis not present

## 2022-03-30 DIAGNOSIS — E876 Hypokalemia: Secondary | ICD-10-CM | POA: Diagnosis not present

## 2022-03-30 DIAGNOSIS — Z992 Dependence on renal dialysis: Secondary | ICD-10-CM | POA: Diagnosis not present

## 2022-03-30 DIAGNOSIS — D509 Iron deficiency anemia, unspecified: Secondary | ICD-10-CM | POA: Diagnosis not present

## 2022-03-30 DIAGNOSIS — D631 Anemia in chronic kidney disease: Secondary | ICD-10-CM | POA: Diagnosis not present

## 2022-03-30 DIAGNOSIS — N2581 Secondary hyperparathyroidism of renal origin: Secondary | ICD-10-CM | POA: Diagnosis not present

## 2022-03-30 DIAGNOSIS — N186 End stage renal disease: Secondary | ICD-10-CM | POA: Diagnosis not present

## 2022-03-31 ENCOUNTER — Other Ambulatory Visit: Payer: Self-pay

## 2022-03-31 ENCOUNTER — Ambulatory Visit (HOSPITAL_COMMUNITY)
Admission: EM | Admit: 2022-03-31 | Discharge: 2022-03-31 | Disposition: A | Discharge status: Home / Self Care | Payer: Medicare Other | Attending: Family Medicine | Admitting: Family Medicine

## 2022-03-31 DIAGNOSIS — M654 Radial styloid tenosynovitis [de Quervain]: Secondary | ICD-10-CM

## 2022-03-31 DIAGNOSIS — T148XXA Other injury of unspecified body region, initial encounter: Secondary | ICD-10-CM | POA: Diagnosis not present

## 2022-03-31 MED ORDER — LIDOCAINE-PRILOCAINE 2.5-2.5 % EX CREA: 1.0000 | TOPICAL_CREAM | CUTANEOUS | 3 refills | Status: AC | PRN

## 2022-03-31 NOTE — ED Provider Notes (Signed)
[place]   [id number] 03/31/22 Arrival Time: [place] PLAN:  1. De Quervain's tenosynovitis, left   2. Multiple skin tears    No indication for plain imaging of wrist at this time. Neuro/vasc intact.  For skin tears of LUE: New Prescriptions   LIDOCAINE-PRILOCAINE (EMLA) CREAM    Apply 1 Application topically as needed.  Use as needed. No signs of skin infection.  Orders Placed This Encounter  Procedures   Apply Thumb spica   Recommend:  Follow-up Information     [place].   Why: If worsening or failing to improve as anticipated. Contact information: [address] [place] [phone]                Reviewed expectations re: course of current medical issues. Questions answered. Outlined signs and symptoms indicating need for more acute intervention. Patient verbalized understanding. After Visit Summary given.  SUBJECTIVE: History from: patient. Destiny Day is a 70 y.o. female who reports persistent pain of radial LEFT wrist; no trauma/injury; hurting this week, esp when gripping items. No extremity sensation changes or weakness. ESRD on dialysis; LEFT arm. Reports skin tears from dialysis; questions removal of band-aid that tore skin; no bleeding. Is painful.  Past Surgical History:  Procedure Laterality Date   A/V SHUNTOGRAM N/A 09/22/2016   Procedure: A/V Shuntogram - left arm;  Surgeon: Serafina Mitchell, MD;  Location: [place] [facility];  Service: Cardiovascular;  Laterality: N/A;   A/V SHUNTOGRAM N/A 03/22/2018   Procedure: A/V SHUNTOGRAM - left arm;  Surgeon: Serafina Mitchell, MD;  Location: [place] [facility];  Service: Cardiovascular;  Laterality: N/A;   A/V SHUNTOGRAM Left 11/17/2018   Procedure: A/V SHUNTOGRAM;  Surgeon: Angelia Mould, MD;  Location: [place] [facility];  Service: Cardiovascular;  Laterality: Left;   ABDOMINAL HYSTERECTOMY  1993`    AMPUTATION Left 09/01/2016   Procedure: LEFT FOOT TRANSMETATARSAL AMPUTATION;  Surgeon: Newt Minion, MD;  Location: [place];  Service: Orthopedics;  Laterality: Left;   AMPUTATION Right 08/11/2017   Procedure: RIGHT GREAT TOE AMPUTATION DIGIT;  Surgeon: Rosetta Posner, MD;  Location: [place];  Service: Vascular;  Laterality: Right;   AMPUTATION Right 12/31/2017   Procedure: RIGHT TRANSMETATARSAL AMPUTATION;  Surgeon: Newt Minion, MD;  Location: [place];  Service: Orthopedics;  Laterality: Right;   AV FISTULA PLACEMENT Left 04/21/2016   Procedure: INSERTION OF ARTERIOVENOUS (AV) GORE-TEX GRAFT ARM LEFT;  Surgeon: Elam Dutch, MD;  Location: [place];  Service: Vascular;  Laterality: Left;   BASCILIC VEIN TRANSPOSITION Left 07/10/2014   Procedure: BASCILIC VEIN TRANSPOSITION;  Surgeon: Angelia Mould, MD;  Location: [place];  Service: Vascular;  Laterality: Left;   [place] Right 11/08/2014   Procedure: FIRST STAGE BASILIC VEIN TRANSPOSITION;  Surgeon: Angelia Mould, MD;  Location: [place];  Service: Vascular;  Laterality: Right;   [place] Right 01/18/2015   Procedure: SECOND STAGE BASILIC VEIN TRANSPOSITION;  Surgeon: Angelia Mould, MD;  Location: [place];  Service: Vascular;  Laterality: Right;   CRANIOTOMY N/A 08/23/2017   Procedure: CRANIOTOMY HEMATOMA EVACUATION SUBDURAL;  Surgeon: Ashok Pall, MD;  Location: [place];  Service: Neurosurgery;  Laterality: N/A;   CRANIOTOMY Left 08/24/2017   Procedure: CRANIOTOMY FOR RECURRENT ACUTE SUBDURAL HEMATOMA;  Surgeon: Ashok Pall, MD;  Location: [place];  Service: Neurosurgery;  Laterality: Left;   ESOPHAGOGASTRODUODENOSCOPY N/A 03/31/2013  Procedure: ESOPHAGOGASTRODUODENOSCOPY (EGD);  Surgeon: Gatha Mayer, MD;  Location: [facility] ENDOSCOPY;  Service: Endoscopy;  Laterality: N/A;   EYE SURGERY     laser surgery   FEMORAL-POPLITEAL BYPASS GRAFT Left 07/08/2016   Procedure: LEFT  FEMORAL-BELOW KNEE POPLITEAL  ARTERY BYPASS GRAFT USING 6MM X 80 CM PROPATEN GORETEX GRAFT WITH RINGS.;  Surgeon: Rosetta Posner, MD;  Location: [facility] OR;  Service: Vascular;  Laterality: Left;   FEMORAL-POPLITEAL BYPASS GRAFT Right 08/11/2017   Procedure: RIGHT FEMORAL TO BELOW KNEE POPLITEAL ARTERKY  BYPASS GRAFT USING 6MM RINGED PROPATEN GRAFT;  Surgeon: Rosetta Posner, MD;  Location: [place];  Service: Vascular;  Laterality: Right;   FISTULOGRAM Left 10/29/2014   Procedure: FISTULOGRAM;  Surgeon: Angelia Mould, MD;  Location: [facility] CATH LAB;  Service: Cardiovascular;  Laterality: Left;   GAS/FLUID EXCHANGE Left 12/20/2018   Procedure: AIR GAS EXCHANGE;  Surgeon: Jalene Mullet, MD;  Location: [place];  Service: Ophthalmology;  Laterality: Left;   INSERTION OF DIALYSIS CATHETER N/A 09/05/2020   Procedure: INSERTION OF DIALYSIS CATHETER;  Surgeon: Cherre Robins, MD;  Location: [place];  Service: Vascular;  Laterality: N/A;   INSERTION OF DIALYSIS CATHETER Left 03/13/2021   Procedure: INSERTION OF DIALYSIS CATHETER;  Surgeon: Waynetta Sandy, MD;  Location: [place];  Service: Vascular;  Laterality: Left;   KNEE ARTHROSCOPY Right 05/06/2018   Procedure: RIGHT KNEE ARTHROSCOPY;  Surgeon: Newt Minion, MD;  Location: [place];  Service: Orthopedics;  Laterality: Right;   KNEE ARTHROSCOPY Right 06/10/2018   Procedure: RIGHT KNEE ARTHROSCOPY AND DEBRIDEMENT;  Surgeon: Newt Minion, MD;  Location: [place];  Service: Orthopedics;  Laterality: Right;   LIGATION OF ARTERIOVENOUS  FISTULA Left 04/21/2016   Procedure: LIGATION OF ARTERIOVENOUS  FISTULA LEFT ARM;  Surgeon: Elam Dutch, MD;  Location: [place];  Service: Vascular;  Laterality: Left;   LOWER EXTREMITY ANGIOGRAPHY N/A 04/06/2017   Procedure: Lower Extremity Angiography - Right;  Surgeon: Serafina Mitchell, MD;  Location: [place] [facility];  Service: Cardiovascular;  Laterality: N/A;   MEMBRANE PEEL Left 12/20/2018   Procedure: MEMBRANE PEEL;  Surgeon: Jalene Mullet, MD;   Location: [place];  Service: Ophthalmology;  Laterality: Left;   PATCH ANGIOPLASTY Right 01/18/2015   Procedure: BASILIC VEIN PATCH ANGIOPLASTY USING VASCUGUARD PATCH;  Surgeon: Angelia Mould, MD;  Location: [place];  Service: Vascular;  Laterality: Right;   PERIPHERAL VASCULAR BALLOON ANGIOPLASTY  09/22/2016   Procedure: Peripheral Vascular Balloon Angioplasty;  Surgeon: Serafina Mitchell, MD;  Location: [place] [facility];  Service: Cardiovascular;;  Lt. Fistula   PERIPHERAL VASCULAR BALLOON ANGIOPLASTY Left 03/22/2018   Procedure: PERIPHERAL VASCULAR BALLOON ANGIOPLASTY;  Surgeon: Serafina Mitchell, MD;  Location: [place] [facility];  Service: Cardiovascular;  Laterality: Left;  Arm shunt   PERIPHERAL VASCULAR CATHETERIZATION N/A 06/23/2016   Procedure: Abdominal Aortogram w/Lower Extremity;  Surgeon: Serafina Mitchell, MD;  Location: [place] [facility];  Service: Cardiovascular;  Laterality: N/A;   PERIPHERAL VASCULAR CATHETERIZATION  06/23/2016   Procedure: Peripheral Vascular Intervention;  Surgeon: Serafina Mitchell, MD;  Location: [place] [facility];  Service: Cardiovascular;;  lt common and external illiac artery   PHOTOCOAGULATION WITH LASER Left 12/20/2018   Procedure: PHOTOCOAGULATION WITH LASER;  Surgeon: Jalene Mullet, MD;  Location: [place];  Service: Ophthalmology;  Laterality: Left;   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   Procedure: REPAIR OF COMPLEX TRACTION RETINAL DETACHMENT;  Surgeon:  Jalene Mullet, MD;  Location: [place];  Service: Ophthalmology;  Laterality: Left;   REVISION OF ARTERIOVENOUS GORETEX GRAFT Left 09/05/2020   Procedure: REVISION OF ARTERIOVENOUS GORETEX GRAFT LEFT;  Surgeon: Cherre Robins, MD;  [facility];  Service: Vascular;  Laterality: Left;      OBJECTIVE:  Vitals:   03/31/22 1331  BP: 137/87  Pulse: 94  Resp: 20  Temp: 97.7 F (36.5 C)  SpO2: 100%    General appearance: alert; no distress HEENT: [place]; AT Neck: supple  with FROM Resp: unlabored respirations Extremities: LUE: warm with well perfused appearance; pain reported over radial side of wrist, more notable with thumb and wrist movement; no erythema or inflammation; no swelling; FROM; very tender over radial styloid; small skin tears of LUE without bleeding and with TTP Skin: warm and dry Neurologic: normal sensation and strength of LUE Psychological: alert and cooperative; normal mood and affect   Allergies  Allergen Reactions   Adhesive  [Tape] Hives   Lisinopril Cough   Prednisone Swelling and Other (See Comments)    Excessive fluid buildup    Past Medical History:  Diagnosis Date   Abdominal bruit    Anxiety    Arthritis    Osteoarthritis   Asthma    Cervical disc disease    "pinced nerve"   CHF (congestive heart failure) (HCC)    Diabetes mellitus    Type II   Diverticulitis    ESRD (end stage renal disease) ([place])    dialysis - M/W/F- [place]   GERD (gastroesophageal reflux disease)    from medications   GI bleed 03/31/2013   Head injury 07/2017   History of hiatal hernia    Hyperlipidemia    Hypertension    Neuropathy    left leg   Nuclear sclerotic cataract of right eye 04/23/2020   Osteoporosis    Peripheral vascular disease ([place])    Pneumonia    "very young" and a few years ago   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)    Vitrectomy membrane peel endolaser 05-07-2021   Seasonal allergies    Shortness of breath dyspnea    WIth exertion, when fluid builds   Sleep apnea    can't afford cpap    Vitreomacular traction syndrome, 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  Tobacco Use   Smoking status: Former    Packs/day: 0.35    Years: 40.00    Total pack years: 14.00    Types: Cigarettes    Quit date: 05/10/2012    Years since quitting: 9.8   Smokeless tobacco: Never  Vaping Use   Vaping Use: Never used  Substance and Sexual Activity   Alcohol use: No   Drug use: No    Comment: marijuana; quit in early 1980's   Sexual activity: Yes  Other Topics Concern   Not on file  Social History Narrative   Patient lives at home with spouse.   Caffeine Use: 1 cup daily  Social Determinants of Health   Financial Resource Strain: Not on file  Food Insecurity: No Food Insecurity (05/30/2020)   Hunger Vital Sign    Worried About Running Out of Food in the Last Year: Never true    Ran Out of Food in the Last Year: Never true  Transportation Needs: No Transportation Needs (05/30/2020)   PRAPARE - Hydrologist (Medical): No    Lack of Transportation (Non-Medical): No  Physical Activity: Not on file  Stress: Not on file  Social Connections: Not on file   Family History  Problem Relation Age of Onset   Other Mother        not sure of cause of death   Diabetes Father    Diabetes Sister    Diabetes Sister    Pancreatic cancer Maternal Grandmother    Colon cancer Neg Hx    Past Surgical History:  Procedure Laterality Date   A/V SHUNTOGRAM N/A 09/22/2016   Procedure: A/V Shuntogram - left arm;  Surgeon: Serafina Mitchell, MD;  Location: [place] [facility];  Service: Cardiovascular;  Laterality: N/A;   A/V SHUNTOGRAM N/A 03/22/2018   Procedure: A/V SHUNTOGRAM - left arm;  Surgeon: Serafina Mitchell, MD;  Location: [place] [facility];  Service: Cardiovascular;  Laterality: N/A;   A/V SHUNTOGRAM Left 11/17/2018   Procedure: A/V SHUNTOGRAM;  Surgeon: Angelia Mould,  MD;  Location: [place] [facility];  Service: Cardiovascular;  Laterality: Left;   ABDOMINAL HYSTERECTOMY  1993`   AMPUTATION Left 09/01/2016   Procedure: LEFT FOOT TRANSMETATARSAL AMPUTATION;  Surgeon: Newt Minion, MD;  Location: [place];  Service: Orthopedics;  Laterality: Left;   AMPUTATION Right 08/11/2017   Procedure: RIGHT GREAT TOE AMPUTATION DIGIT;  Surgeon: Rosetta Posner, MD;  Location: [place];  Service: Vascular;  Laterality: Right;   AMPUTATION Right 12/31/2017   Procedure: RIGHT TRANSMETATARSAL AMPUTATION;  Surgeon: Newt Minion, MD;  Location: [place];  Service: Orthopedics;  Laterality: Right;   AV FISTULA PLACEMENT Left 04/21/2016   Procedure: INSERTION OF ARTERIOVENOUS (AV) GORE-TEX GRAFT ARM LEFT;  Surgeon: Elam Dutch, MD;  Location: [place];  Service: Vascular;  Laterality: Left;   BASCILIC VEIN TRANSPOSITION Left 07/10/2014   Procedure: BASCILIC VEIN TRANSPOSITION;  Surgeon: Angelia Mould, MD;  Location: [place];  Service: Vascular;  Laterality: Left;   [place] Right 11/08/2014   Procedure: FIRST STAGE BASILIC VEIN TRANSPOSITION;  Surgeon: Angelia Mould, MD;  Location: [place];  Service: Vascular;  Laterality: Right;   [place] Right 01/18/2015   Procedure: SECOND STAGE BASILIC VEIN TRANSPOSITION;  Surgeon: Angelia Mould, MD;  Location: [place];  Service: Vascular;  Laterality: Right;   CRANIOTOMY N/A 08/23/2017   Procedure: CRANIOTOMY HEMATOMA EVACUATION SUBDURAL;  Surgeon: Ashok Pall, MD;  Location: [place];  Service: Neurosurgery;  Laterality: N/A;   CRANIOTOMY Left 08/24/2017   Procedure: CRANIOTOMY FOR RECURRENT ACUTE SUBDURAL HEMATOMA;  Surgeon: Ashok Pall, MD;  Location: [place];  Service: Neurosurgery;  Laterality: Left;   ESOPHAGOGASTRODUODENOSCOPY N/A 03/31/2013   Procedure: ESOPHAGOGASTRODUODENOSCOPY (EGD);  Surgeon: Gatha Mayer, MD;  Location: [facility] ENDOSCOPY;  Service: Endoscopy;  Laterality: N/A;   EYE SURGERY      laser surgery   FEMORAL-POPLITEAL BYPASS GRAFT Left 07/08/2016   Procedure: LEFT  FEMORAL-BELOW KNEE POPLITEAL ARTERY BYPASS GRAFT USING 6MM X 80 CM PROPATEN GORETEX GRAFT WITH RINGS.;  Surgeon: Rosetta Posner, MD;  [facility];  Service: Vascular;  Laterality: Left;   FEMORAL-POPLITEAL BYPASS GRAFT Right 08/11/2017   Procedure: RIGHT FEMORAL TO BELOW KNEE POPLITEAL ARTERKY  BYPASS GRAFT USING 6MM RINGED PROPATEN GRAFT;  Surgeon: Rosetta Posner, MD;  Location: [place];  Service: Vascular;  Laterality: Right;   FISTULOGRAM Left 10/29/2014   Procedure: FISTULOGRAM;  Surgeon: Angelia Mould, MD;  Location: [facility] CATH LAB;  Service: Cardiovascular;  Laterality: Left;   GAS/FLUID EXCHANGE Left 12/20/2018   Procedure: AIR GAS EXCHANGE;  Surgeon: Jalene Mullet, MD;  Location: [place];  Service: Ophthalmology;  Laterality: Left;   INSERTION OF DIALYSIS CATHETER N/A 09/05/2020   Procedure: INSERTION OF DIALYSIS CATHETER;  Surgeon: Cherre Robins, MD;  Location: [place];  Service: Vascular;  Laterality: N/A;   INSERTION OF DIALYSIS CATHETER Left 03/13/2021   Procedure: INSERTION OF DIALYSIS CATHETER;  Surgeon: Waynetta Sandy, MD;  Location: [place];  Service: Vascular;  Laterality: Left;   KNEE ARTHROSCOPY Right 05/06/2018   Procedure: RIGHT KNEE ARTHROSCOPY;  Surgeon: Newt Minion, MD;  Location: [place];  Service: Orthopedics;  Laterality: Right;   KNEE ARTHROSCOPY Right 06/10/2018   Procedure: RIGHT KNEE ARTHROSCOPY AND DEBRIDEMENT;  Surgeon: Newt Minion, MD;  Location: [place];  Service: Orthopedics;  Laterality: Right;   LIGATION OF ARTERIOVENOUS  FISTULA Left 04/21/2016   Procedure: LIGATION OF ARTERIOVENOUS  FISTULA LEFT ARM;  Surgeon: Elam Dutch, MD;  Location: [place];  Service: Vascular;  Laterality: Left;   LOWER EXTREMITY ANGIOGRAPHY N/A 04/06/2017   Procedure: Lower Extremity Angiography - Right;  Surgeon: Serafina Mitchell, MD;  Location: [place] [facility];  Service:  Cardiovascular;  Laterality: N/A;   MEMBRANE PEEL Left 12/20/2018   Procedure: MEMBRANE PEEL;  Surgeon: Jalene Mullet, MD;  Location: [place];  Service: Ophthalmology;  Laterality: Left;   PATCH ANGIOPLASTY Right 01/18/2015   Procedure: BASILIC VEIN PATCH ANGIOPLASTY USING VASCUGUARD PATCH;  Surgeon: Angelia Mould, MD;  Location: [place];  Service: Vascular;  Laterality: Right;   PERIPHERAL VASCULAR BALLOON ANGIOPLASTY  09/22/2016   Procedure: Peripheral Vascular Balloon Angioplasty;  Surgeon: Serafina Mitchell, MD;  Location: [place] [facility] LAB;  Service: Cardiovascular;;  Lt. Fistula   PERIPHERAL VASCULAR BALLOON ANGIOPLASTY Left 03/22/2018   Procedure: PERIPHERAL VASCULAR BALLOON ANGIOPLASTY;  Surgeon: Serafina Mitchell, MD;  Location: [place] [facility];  Service: Cardiovascular;  Laterality: Left;  Arm shunt   PERIPHERAL VASCULAR CATHETERIZATION N/A 06/23/2016   Procedure: Abdominal Aortogram w/Lower Extremity;  Surgeon: Serafina Mitchell, MD;  Location: [place] [facility];  Service: Cardiovascular;  Laterality: N/A;   PERIPHERAL VASCULAR CATHETERIZATION  06/23/2016   Procedure: Peripheral Vascular Intervention;  Surgeon: Serafina Mitchell, MD;  Location: [place] [facility] LAB;  Service: Cardiovascular;;  lt common and external illiac artery   PHOTOCOAGULATION WITH LASER Left 12/20/2018   Procedure: PHOTOCOAGULATION WITH LASER;  Surgeon: Jalene Mullet, MD;  Location: [place];  Service: Ophthalmology;  Laterality: Left;   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   Procedure: REPAIR OF COMPLEX TRACTION RETINAL DETACHMENT;  Surgeon: Jalene Mullet, MD;  Location: [place];  Service: Ophthalmology;  Laterality: Left;   REVISION OF ARTERIOVENOUS GORETEX GRAFT Left 09/05/2020   Procedure: REVISION OF ARTERIOVENOUS GORETEX GRAFT LEFT;  Surgeon: Cherre Robins, MD;  Location: [place];  Service: Vascular;  Laterality: Left;       Vanessa Kick, MD 03/31/22 1410

## 2022-03-31 NOTE — Discharge Instructions (Signed)
Wear your wrist/thumb splint for the next 1-1.5 weeks and follow up with the sports medicine specialist if not improving. Continue taking Tylenol as needed.

## 2022-03-31 NOTE — ED Triage Notes (Signed)
Pt reports pain to Lt wrist  and skin tears at dialysis site.

## 2022-04-01 DIAGNOSIS — Z992 Dependence on renal dialysis: Secondary | ICD-10-CM | POA: Diagnosis not present

## 2022-04-01 DIAGNOSIS — D509 Iron deficiency anemia, unspecified: Secondary | ICD-10-CM | POA: Diagnosis not present

## 2022-04-01 DIAGNOSIS — N2581 Secondary hyperparathyroidism of renal origin: Secondary | ICD-10-CM | POA: Diagnosis not present

## 2022-04-01 DIAGNOSIS — E876 Hypokalemia: Secondary | ICD-10-CM | POA: Diagnosis not present

## 2022-04-01 DIAGNOSIS — N186 End stage renal disease: Secondary | ICD-10-CM | POA: Diagnosis not present

## 2022-04-01 DIAGNOSIS — D631 Anemia in chronic kidney disease: Secondary | ICD-10-CM | POA: Diagnosis not present

## 2022-04-02 ENCOUNTER — Ambulatory Visit: Payer: Self-pay

## 2022-04-03 DIAGNOSIS — Z992 Dependence on renal dialysis: Secondary | ICD-10-CM | POA: Diagnosis not present

## 2022-04-03 DIAGNOSIS — E876 Hypokalemia: Secondary | ICD-10-CM | POA: Diagnosis not present

## 2022-04-03 DIAGNOSIS — D631 Anemia in chronic kidney disease: Secondary | ICD-10-CM | POA: Diagnosis not present

## 2022-04-03 DIAGNOSIS — N2581 Secondary hyperparathyroidism of renal origin: Secondary | ICD-10-CM | POA: Diagnosis not present

## 2022-04-03 DIAGNOSIS — N186 End stage renal disease: Secondary | ICD-10-CM | POA: Diagnosis not present

## 2022-04-03 DIAGNOSIS — D509 Iron deficiency anemia, unspecified: Secondary | ICD-10-CM | POA: Diagnosis not present

## 2022-04-04 NOTE — Patient Instructions (Signed)
Visit Information  Thank you for taking time to visit with me today. Please don't hesitate to contact me if I can be of assistance to you.   Following are the goals we discussed today:   Goals Addressed             This Visit's Progress    I need an aluminum walker  with a seeat       Care Coordination Interventions: Reviewed scheduled/upcoming provider appointments including 9/26 at 10 am Discussed plans with patient for ongoing care management follow up and provided patient with direct contact information for care management team Active listening / Reflection utilized  Emotional Support Provided Problem [place] strategies reviewed During my conversation with Mrs. Gehres, she expressed her desire to have a walker equipped with a seat. Although I personally haven't come across one, she mentioned that [place] carries such a product. However, the one she saw was priced in the 200s, which is beyond her budget. She recently acquired a rollator through her insurance, but it's too bulky for her to lift when she needs to attend dialysis. I assured her that I would help find an alternative solution. After contacting [facility], they informed me that they have a clip-on seat that can be attached to regular walkers for only $36.95. This seat can be easily folded up and moved out of the way when not in use.        Our next appointment is by telephone on 9/26 at 10 am  Please call the care guide team at [phone] if you need to cancel or reschedule your appointment.   If you are experiencing a Mental Health or [place] or need someone to talk to, please call 1-800-273-TALK (toll free, 24 hour hotline)  Patient verbalizes understanding of instructions and care plan provided today and agrees to view in [place]. Active MyChart status and patient understanding of how to access instructions and care plan via MyChart  confirmed with patient.      Lazaro Arms RN, BSN, [facility]   Phone: [phone]

## 2022-04-04 NOTE — Patient Outreach (Signed)
  Care Coordination   Initial Visit Note   04/02/2022  Name: Destiny Day MRN: [id number] DOB: 1951/02/16  Destiny Day is a 71 y.o. year old female who sees Cipriano Mile, NP for primary care. I spoke with  Destiny Day by phone today.  What matters to the patients health and wellness today?  I need a walker with a seat.    Goals Addressed             This Visit's Progress    I need an aluminum walker  with a seeat       Care Coordination Interventions: Reviewed scheduled/upcoming provider appointments including 9/26 at 10 am Discussed plans with patient for ongoing care management follow up and provided patient with direct contact information for care management team Active listening / Reflection utilized  Emotional Support Provided Problem [place] strategies reviewed During my conversation with Destiny Day, she expressed her desire to have a walker equipped with a seat. Although I personally haven't come across one, she mentioned that [place] carries such a product. However, the one she saw was priced in the 200s, which is beyond her budget. She recently acquired a rollator through her insurance, but it's too bulky for her to lift when she needs to attend dialysis. I assured her that I would help find an alternative solution. After contacting [facility], they informed me that they have a clip-on seat that can be attached to regular walkers for only $36.95. This seat can be easily folded up and moved out of the way when not in use.        SDOH assessments and interventions completed:  Yes  SDOH Interventions Today    Flowsheet Row Most Recent Value  SDOH Interventions   Food Insecurity Interventions Intervention Not Indicated  Transportation Interventions Intervention Not Indicated        Care Coordination Interventions Activated:  Yes  Care Coordination Interventions:  Yes, provided    Follow up plan: Follow up call scheduled for 9/26 10 am    Encounter Outcome:  Pt. Visit Completed   Lazaro Arms RN, BSN, [facility]   Phone: [phone]

## 2022-04-06 DIAGNOSIS — D631 Anemia in chronic kidney disease: Secondary | ICD-10-CM | POA: Diagnosis not present

## 2022-04-06 DIAGNOSIS — D509 Iron deficiency anemia, unspecified: Secondary | ICD-10-CM | POA: Diagnosis not present

## 2022-04-06 DIAGNOSIS — Z992 Dependence on renal dialysis: Secondary | ICD-10-CM | POA: Diagnosis not present

## 2022-04-06 DIAGNOSIS — E876 Hypokalemia: Secondary | ICD-10-CM | POA: Diagnosis not present

## 2022-04-06 DIAGNOSIS — N186 End stage renal disease: Secondary | ICD-10-CM | POA: Diagnosis not present

## 2022-04-06 DIAGNOSIS — N2581 Secondary hyperparathyroidism of renal origin: Secondary | ICD-10-CM | POA: Diagnosis not present

## 2022-04-07 ENCOUNTER — Other Ambulatory Visit: Payer: Self-pay | Admitting: Allergy & Immunology

## 2022-04-07 ENCOUNTER — Ambulatory Visit (INDEPENDENT_AMBULATORY_CARE_PROVIDER_SITE_OTHER): Payer: Medicare Other | Admitting: Allergy & Immunology

## 2022-04-07 ENCOUNTER — Encounter: Payer: Self-pay | Admitting: Allergy & Immunology

## 2022-04-07 VITALS — BP 130/78 | HR 60 | Temp 97.2°F | Resp 16 | Ht 65.0 in

## 2022-04-07 DIAGNOSIS — K9049 Malabsorption due to intolerance, not elsewhere classified: Secondary | ICD-10-CM | POA: Diagnosis not present

## 2022-04-07 DIAGNOSIS — R053 Chronic cough: Secondary | ICD-10-CM

## 2022-04-07 DIAGNOSIS — J302 Other seasonal allergic rhinitis: Secondary | ICD-10-CM

## 2022-04-07 DIAGNOSIS — J449 Chronic obstructive pulmonary disease, unspecified: Secondary | ICD-10-CM | POA: Diagnosis not present

## 2022-04-07 DIAGNOSIS — J309 Allergic rhinitis, unspecified: Secondary | ICD-10-CM

## 2022-04-07 DIAGNOSIS — I7025 Atherosclerosis of native arteries of other extremities with ulceration: Secondary | ICD-10-CM | POA: Diagnosis not present

## 2022-04-07 DIAGNOSIS — J3089 Other allergic rhinitis: Secondary | ICD-10-CM

## 2022-04-07 DIAGNOSIS — E042 Nontoxic multinodular goiter: Secondary | ICD-10-CM

## 2022-04-07 MED ORDER — MONTELUKAST SODIUM 10 MG PO TABS: ORAL_TABLET | ORAL | 5 refills | Status: AC

## 2022-04-07 MED ORDER — FLUTICASONE PROPIONATE 50 MCG/ACT NA SUSP: 2.0000 | Freq: Every day | NASAL | 5 refills | Status: AC | PRN

## 2022-04-07 MED ORDER — LEVALBUTEROL TARTRATE 45 MCG/ACT IN AERO: 2.0000 | INHALATION_SPRAY | Freq: Four times a day (QID) | RESPIRATORY_TRACT | 1 refills | Status: DC | PRN

## 2022-04-07 MED ORDER — LEVOCETIRIZINE DIHYDROCHLORIDE 5 MG PO TABS: 5.0000 mg | ORAL_TABLET | Freq: Every evening | ORAL | 5 refills | Status: DC

## 2022-04-07 MED ORDER — BUDESONIDE-FORMOTEROL FUMARATE 160-4.5 MCG/ACT IN AERO: 2.0000 | INHALATION_SPRAY | Freq: Two times a day (BID) | RESPIRATORY_TRACT | 5 refills | Status: AC

## 2022-04-07 NOTE — Patient Instructions (Addendum)
1. Asthma COPD overlap syndrome - Lung testing looked a bit worse. - I think we are going to know more from the chest CT on Thursday. - I believe that we are in a holding pattern at this point in time.  - Daily controller medication(s): Singulair '10mg'$  daily only + Symbicort two puffs two puffs twice daily - Prior to physical activity: albuterol 2 puffs 10-15 minutes before physical activity. - Rescue medications: albuterol 4 puffs every 4-6 hours as needed or albuterol nebulizer one vial every 4-6 hours as needed - Asthma control goals:  * Full participation in all desired activities (may need albuterol before activity) * Albuterol use two time or less a week on average (not counting use with activity) * Cough interfering with sleep two time or less a month * Oral steroids no more than once a year * No hospitalizations  2. Chronic rhinitis (dust mites, cat, dog, grass, and indoor and outdoor molds) - Contonue with allergy shots at the same schedule.  - Continue with Xyzal '5mg'$  daily. - Continue with Singulair '10mg'$  daily. - Continue with Flonase one spray per nostril twice daily and Astelin one spray per nostril twice daily.   3. Concern for food allergies. - Previously testing was negative.  - It is OK to avoid these foods, however.   4. GERD  - Continue with omeprazole '40mg'$  daily.  5. Return in about 4 months (around 08/07/2022).    Please inform us of any Emergency Department visits, hospitalizations, or changes in symptoms. Call us before going to the ED for breathing or allergy symptoms since we might be able to fit you in for a sick visit. Feel free to contact us anytime with any questions, problems, or concerns.  It was a pleasure to see you again today!  Websites that have reliable patient information: 1. American Academy of Asthma, Allergy, and Immunology: www.aaaai.org 2. Food Allergy Research and Education (FARE): foodallergy.org 3. Mothers of Asthmatics:  http://www.asthmacommunitynetwork.org 4. American College of Allergy, Asthma, and Immunology: www.acaai.org   COVID-19 Vaccine Information can be found at: ShippingScam.co.uk For questions related to vaccine distribution or appointments, please email [email or web address] or call [phone].     "Like" [place] on Facebook and Instagram for our latest updates!       Make sure you are registered to vote! If you have moved or changed any of your contact information, you will need to get this updated before voting!  In some cases, you MAY be able to register to vote online: [email or web address]

## 2022-04-07 NOTE — Progress Notes (Signed)
FOLLOW UP  Date of Service/Encounter:  04/07/22   Assessment:   Moderate persistent asthma, uncomplicated - with worsening cough (chest CT pending)   Chronic rhinitis (dust mites, cat, dog, grass, and indoor and outdoor molds) - on allergen immunotherapy with maintenance reached in November 2021   Elevated eosinophils - likely uncontrolled allergies   Complicated past medical history, including kidney disease on dialysis   [name] continues to have a cough, but it is reportedly a little bit better since last time we saw her.  I think we are going to know more after her chest CT which is scheduled for this Thursday.  Plan/Recommendations:   1. Asthma COPD overlap syndrome - Lung testing looked a bit worse. - I think we are going to know more from the chest CT on Thursday. - I believe that we are in a holding pattern at this point in time.  - Daily controller medication(s): Singulair '10mg'$  daily only + Symbicort two puffs two puffs twice daily - Prior to physical activity: albuterol 2 puffs 10-15 minutes before physical activity. - Rescue medications: albuterol 4 puffs every 4-6 hours as needed or albuterol nebulizer one vial every 4-6 hours as needed - Asthma control goals:  * Full participation in all desired activities (may need albuterol before activity) * Albuterol use two time or less a week on average (not counting use with activity) * Cough interfering with sleep two time or less a month * Oral steroids no more than once a year * No hospitalizations  2. Chronic rhinitis (dust mites, cat, dog, grass, and indoor and outdoor molds) - Contonue with allergy shots at the same schedule.  - Continue with Xyzal '5mg'$  daily. - Continue with Singulair '10mg'$  daily. - Continue with Flonase one spray per nostril twice daily and Astelin one spray per nostril twice daily.   3. Concern for food allergies. - Previously testing was negative.  - It is OK to avoid these foods, however.    4. GERD  - Continue with omeprazole '40mg'$  daily.  5. Return in about 4 months (around 08/07/2022).    Subjective:   Destiny Day is a 71 y.o. female presenting today for follow up of  Chief Complaint  Patient presents with   Asthma    No change - still coughing    Allergic Rhinitis     Congestion, itching, watery eyes, runny nose    Other    Went to urgent care a week ago - she has tinnitus in her left hand     Destiny Day has a history of the following: Patient Active Problem List   Diagnosis Date Noted   Primary open angle glaucoma of left eye, mild stage 10/23/2021   ESRD (end stage renal disease) ([place]) 08/04/2021   Fluid overload 08/03/2021   Prolonged QT interval 08/03/2021   Acute on chronic heart failure ([place]) 01/05/2021   Macular pucker, right eye 10/29/2020   Nausea with vomiting, unspecified 09/10/2020   Pseudophakia of left eye 07/02/2020   Diabetic macular edema of right eye with proliferative retinopathy associated with type 2 diabetes mellitus ([place]) 07/02/2020   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 type 2 diabetes mellitus ([place]) 04/23/2020   Allergy, unspecified, initial encounter 04/04/2020   Anaphylactic shock, unspecified, initial encounter 04/04/2020   Secondary hypertension, unspecified 12/13/2019   Other disorders of phosphorus metabolism 08/24/2019   Paroxysmal atrial fibrillation ([place]) 06/13/2019   Hypocalcemia 04/17/2019   Old bucket  handle tear of lateral meniscus of right knee    Old peripheral tear of medial meniscus of right knee    Staphylococcal arthritis, unspecified joint ([place]) 05/11/2018   Septic arthritis of knee, right ([place]) 05/06/2018   Degenerative tear of posterior horn of medial meniscus, right    Old complex tear of lateral meniscus of right knee    Loose body in knee, right    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[place]) 02/03/2018   End-stage renal disease on  hemodialysis ([place])    Respiratory failure ([place]) 01/21/2018   Achilles tendon contracture, right 11/09/2017   Labile blood glucose    Anemia of infection and chronic disease    Black stool    Right sided weakness    Subdural hemorrhage following injury ([place]) 08/30/2017   Diabetic peripheral neuropathy (H[facility])    Chronic obstructive pulmonary disease ([place])    Subdural hematoma ([place]) 08/24/2017   Traumatic subdural hematoma ([place]) 08/23/2017   Fall    SDH (subdural hematoma) ([place])    Acute respiratory failure with hypoxia ([place])    Diabetes mellitus type 2 in nonobese ([place])    Leukocytosis    Labile blood pressure    Generalized anxiety disorder    Debility 08/16/2017   Amputated great toe of right foot ([place])    Amputated toe of left foot ([place])    Post-operative pain    ESRD on dialysis [facility])    Anxiety state    Acute blood loss anemia    Diarrhea, unspecified 01/16/2017   Dependence on renal dialysis ([place]) 01/06/2017   Idiopathic chronic venous hypertension of both lower extremities with inflammation 10/13/2016   S/P transmetatarsal amputation of foot, left ([place]) 09/21/2016   Onychomycosis 08/15/2016   Unspecified protein-calorie malnutrition ([place]) 07/29/2016   Other mechanical complication of femoral arterial graft (bypass), initial encounter ([place]) 07/14/2016   Other pneumonia, unspecified organism 07/11/2016   Age-related osteoporosis without current pathological fracture 07/11/2016   Cervical disc disorder, unspecified, unspecified cervical region 07/11/2016   Diverticulitis of intestine, part unspecified, without perforation or abscess without bleeding 07/11/2016   Unspecified abdominal hernia without obstruction or gangrene 07/11/2016   Gastro-esophageal reflux disease with esophagitis 07/11/2016   Primary generalized (osteo)arthritis 07/11/2016   Peripheral vascular disease ([place]) 07/08/2016   Cellulitis of left toe 06/29/2016   Atherosclerosis of native artery of left lower  extremity with gangrene ([place]) 47/42/5956   Complication of vascular dialysis catheter 02/29/2016   Encounter for screening for other viral diseases 01/10/2016   Encounter for removal of sutures 10/29/2015   Other fluid overload 10/23/2015   Anemia in chronic kidney disease 10/16/2015   Essential (primary) hypertension 10/16/2015   Acute pulmonary edema ([place]) 10/16/2015   Coagulation defect, unspecified ([place]) 10/16/2015   Acute respiratory failure ([place]) 10/16/2015   Hyperlipidemia, unspecified 10/16/2015   Iron deficiency anemia, unspecified 10/16/2015   Pain, unspecified 10/16/2015   Pruritus, unspecified 10/16/2015   Secondary hyperparathyroidism of renal origin ([place]) 10/16/2015   Shortness of breath 10/16/2015   GERD (gastroesophageal reflux disease) 10/07/2015   ARF (acute renal failure) ([place])    Hypothermia 06/12/2015   End stage renal disease ([place]) 06/12/2015   Unspecified diastolic (congestive) heart failure ([place]) 07/30/2014   CKD (chronic kidney disease) stage 4, GFR 15-29 ml/min ([place]) 06/27/2014   Hypertensive heart disease 05/25/2013   CKD (chronic kidney disease) stage 3, GFR 30-59 ml/min (HCC) 05/25/2013   Hypertensive urgency 05/23/2013   Pulmonary edema 05/23/2013   Type 2 diabetes  mellitus with diabetic chronic kidney disease ([place]) 05/23/2013   Type 2 diabetes mellitus with hyperosmolar nonketotic hyperglycemia ([place]) 04/13/2013   Acute on chronic diastolic heart failure ([place]) 04/13/2013   Gastrointestinal hemorrhage, unspecified 03/31/2013   Hypokalemia 08/24/2012   Hyperlipidemia associated with type 2 diabetes mellitus ([place]) 12/27/2007   Allergic rhinitis due to pollen 12/27/2007   Unspecified asthma, uncomplicated 44/31/5400    History obtained from: chart review and patient.  Destiny Day is a 71 y.o. female presenting for a follow up visit. She was last seen in July 2023. At that time, her lung testing looked slightly better. We continued with the Symbicort two  puffs BID as well as Singulair '10mg'$  daily. For her rhinitis, we continue with levocetirizine as well as montelukast, Flonase, and Astelin. We increased her reflux medication to omperazole '40mg'$  daily.   Since the last visit, she has continued to have a cough. She is scheduled for a chest CT on Thursday. She does not remember the last time that she had one.   Asthma/Respiratory Symptom History: She remains on the Symbicort two puffs twice daily.  She is also on the montelukast daily.  She had a coughing fit yesterday when she was in dialysis. She tells me that it is very cold in there and this causes her to cough more. She uses a blanket and the seats are heated. She also uses an arm warmer to remain warm.   Allergic Rhinitis Symptom History: She is on the levocetirizine. She is on Flonase and Astelin and does  not like the taste.   She has not been on antibiotics at all since the last visit. She remains on her allergy shots which are helping, although not completely. She has been on allergy shots for a couple of years.   GERD Symptom History: She remains on omeprazole 40 mg daily.  She does feel like this is helped her cough to a certain extent.  She was recently diagnosed with left sided tendonitis. This has been a week now and she needs to wear it for a week and a half. She might have to go and see Orthopedic.   Otherwise, there have been no changes to her past medical history, surgical history, family history, or social history.    Review of Systems  Constitutional: Negative.  Negative for chills, fever, malaise/fatigue and weight loss.  HENT:  Positive for congestion. Negative for ear discharge and ear pain.   Eyes:  Negative for pain, discharge and redness.  Respiratory:  Positive for cough. Negative for sputum production, shortness of breath and wheezing.   Cardiovascular: Negative.  Negative for chest pain and palpitations.  Gastrointestinal:  Negative for abdominal pain, constipation,  diarrhea, heartburn, nausea and vomiting.  Skin: Negative.  Negative for itching and rash.  Neurological:  Negative for dizziness and headaches.  Endo/Heme/Allergies:  Negative for environmental allergies. Does not bruise/bleed easily.       Objective:   Blood pressure 130/78, pulse 60, temperature (!) 97.2 F (36.2 C), resp. rate 16, height '5\' 5"'$  (1.651 m), SpO2 95 %. Body mass index is 22.65 kg/m.    Physical Exam Vitals reviewed.  Constitutional:      Appearance: She is well-developed.     Comments: Very pleasant. Still singing today.   HENT:     Head: Normocephalic and atraumatic.     Right Ear: Tympanic membrane, ear canal and external ear normal.     Left Ear: Tympanic membrane, ear canal and external ear normal.  Ears:     Comments: t    Nose: No nasal deformity, septal deviation, mucosal edema or rhinorrhea.     Right Turbinates: Enlarged, swollen and pale.     Left Turbinates: Enlarged, swollen and pale.     Right Sinus: No maxillary sinus tenderness or frontal sinus tenderness.     Left Sinus: No maxillary sinus tenderness or frontal sinus tenderness.     Comments: No nasal polyps noted. Clear rhinorrhea.     Mouth/Throat:     Lips: Pink.     Mouth: Mucous membranes are moist. Mucous membranes are not pale and not dry.     Pharynx: Uvula midline.  Eyes:     General: Lids are normal. Allergic shiner present.        Right eye: No discharge.        Left eye: No discharge.     Conjunctiva/sclera: Conjunctivae normal.     Right eye: Right conjunctiva is not injected. No chemosis.    Left eye: Left conjunctiva is not injected. No chemosis.    Pupils: Pupils are equal, round, and reactive to light.  Cardiovascular:     Rate and Rhythm: Normal rate and regular rhythm.     Heart sounds: Normal heart sounds.  Pulmonary:     Effort: Pulmonary effort is normal. No tachypnea, accessory muscle usage or respiratory distress.     Breath sounds: Normal breath sounds. No  wheezing, rhonchi or rales.     Comments: Moving air well in all lung fields.  No increased work of breathing. Coarse airway sounds throughout.  Chest:     Chest wall: No tenderness.  Lymphadenopathy:     Cervical: No cervical adenopathy.  Skin:    Coloration: Skin is not pale.     Findings: No abrasion, erythema, petechiae or rash. Rash is not papular, urticarial or vesicular.  Neurological:     Mental Status: She is alert.  Psychiatric:        Behavior: Behavior is cooperative.      Diagnostic studies:    Spirometry: results abnormal (FEV1: 0.85/45%, FVC: 1.60/65%, FEV1/FVC: 53%).    Spirometry consistent with mixed obstructive and restrictive disease.   Allergy Studies: none        Salvatore Marvel, MD  [facility]

## 2022-04-07 NOTE — Addendum Note (Signed)
Addended by: Larence Penning on: 04/07/2022 02:39 PM   Modules accepted: Orders

## 2022-04-08 DIAGNOSIS — D631 Anemia in chronic kidney disease: Secondary | ICD-10-CM | POA: Diagnosis not present

## 2022-04-08 DIAGNOSIS — N2581 Secondary hyperparathyroidism of renal origin: Secondary | ICD-10-CM | POA: Diagnosis not present

## 2022-04-08 DIAGNOSIS — Z992 Dependence on renal dialysis: Secondary | ICD-10-CM | POA: Diagnosis not present

## 2022-04-08 DIAGNOSIS — N186 End stage renal disease: Secondary | ICD-10-CM | POA: Diagnosis not present

## 2022-04-08 DIAGNOSIS — D509 Iron deficiency anemia, unspecified: Secondary | ICD-10-CM | POA: Diagnosis not present

## 2022-04-08 DIAGNOSIS — E876 Hypokalemia: Secondary | ICD-10-CM | POA: Diagnosis not present

## 2022-04-09 ENCOUNTER — Ambulatory Visit
Admission: RE | Admit: 2022-04-09 | Discharge: 2022-04-09 | Disposition: A | Discharge status: Home / Self Care | Payer: Medicare Other | Source: Ambulatory Visit | Attending: Allergy & Immunology | Admitting: Allergy & Immunology

## 2022-04-09 DIAGNOSIS — J45909 Unspecified asthma, uncomplicated: Secondary | ICD-10-CM | POA: Diagnosis not present

## 2022-04-09 DIAGNOSIS — J449 Chronic obstructive pulmonary disease, unspecified: Secondary | ICD-10-CM

## 2022-04-09 DIAGNOSIS — I700 Atherosclerosis of aorta: Secondary | ICD-10-CM | POA: Diagnosis not present

## 2022-04-09 DIAGNOSIS — R053 Chronic cough: Secondary | ICD-10-CM

## 2022-04-09 DIAGNOSIS — J432 Centrilobular emphysema: Secondary | ICD-10-CM | POA: Diagnosis not present

## 2022-04-09 DIAGNOSIS — I2510 Atherosclerotic heart disease of native coronary artery without angina pectoris: Secondary | ICD-10-CM | POA: Diagnosis not present

## 2022-04-10 DIAGNOSIS — E876 Hypokalemia: Secondary | ICD-10-CM | POA: Diagnosis not present

## 2022-04-10 DIAGNOSIS — D509 Iron deficiency anemia, unspecified: Secondary | ICD-10-CM | POA: Diagnosis not present

## 2022-04-10 DIAGNOSIS — N2581 Secondary hyperparathyroidism of renal origin: Secondary | ICD-10-CM | POA: Diagnosis not present

## 2022-04-10 DIAGNOSIS — N186 End stage renal disease: Secondary | ICD-10-CM | POA: Diagnosis not present

## 2022-04-10 DIAGNOSIS — Z992 Dependence on renal dialysis: Secondary | ICD-10-CM | POA: Diagnosis not present

## 2022-04-10 DIAGNOSIS — D631 Anemia in chronic kidney disease: Secondary | ICD-10-CM | POA: Diagnosis not present

## 2022-04-13 DIAGNOSIS — Z992 Dependence on renal dialysis: Secondary | ICD-10-CM | POA: Diagnosis not present

## 2022-04-13 DIAGNOSIS — E876 Hypokalemia: Secondary | ICD-10-CM | POA: Diagnosis not present

## 2022-04-13 DIAGNOSIS — D631 Anemia in chronic kidney disease: Secondary | ICD-10-CM | POA: Diagnosis not present

## 2022-04-13 DIAGNOSIS — D509 Iron deficiency anemia, unspecified: Secondary | ICD-10-CM | POA: Diagnosis not present

## 2022-04-13 DIAGNOSIS — N2581 Secondary hyperparathyroidism of renal origin: Secondary | ICD-10-CM | POA: Diagnosis not present

## 2022-04-13 DIAGNOSIS — N186 End stage renal disease: Secondary | ICD-10-CM | POA: Diagnosis not present

## 2022-04-14 NOTE — Addendum Note (Signed)
Addended by: Eloy End D on: 04/14/2022 06:57 PM   Modules accepted: Orders

## 2022-04-15 DIAGNOSIS — Z992 Dependence on renal dialysis: Secondary | ICD-10-CM | POA: Diagnosis not present

## 2022-04-15 DIAGNOSIS — D631 Anemia in chronic kidney disease: Secondary | ICD-10-CM | POA: Diagnosis not present

## 2022-04-15 DIAGNOSIS — E876 Hypokalemia: Secondary | ICD-10-CM | POA: Diagnosis not present

## 2022-04-15 DIAGNOSIS — N2581 Secondary hyperparathyroidism of renal origin: Secondary | ICD-10-CM | POA: Diagnosis not present

## 2022-04-15 DIAGNOSIS — N186 End stage renal disease: Secondary | ICD-10-CM | POA: Diagnosis not present

## 2022-04-15 DIAGNOSIS — D509 Iron deficiency anemia, unspecified: Secondary | ICD-10-CM | POA: Diagnosis not present

## 2022-04-16 ENCOUNTER — Ambulatory Visit
Admission: RE | Admit: 2022-04-16 | Discharge: 2022-04-16 | Disposition: A | Discharge status: Home / Self Care | Payer: Medicare Other | Source: Ambulatory Visit | Attending: Allergy & Immunology | Admitting: Allergy & Immunology

## 2022-04-16 DIAGNOSIS — E041 Nontoxic single thyroid nodule: Secondary | ICD-10-CM | POA: Diagnosis not present

## 2022-04-16 DIAGNOSIS — E010 Iodine-deficiency related diffuse (endemic) goiter: Secondary | ICD-10-CM | POA: Diagnosis not present

## 2022-04-17 ENCOUNTER — Telehealth: Payer: Self-pay

## 2022-04-17 DIAGNOSIS — N2581 Secondary hyperparathyroidism of renal origin: Secondary | ICD-10-CM | POA: Diagnosis not present

## 2022-04-17 DIAGNOSIS — D509 Iron deficiency anemia, unspecified: Secondary | ICD-10-CM | POA: Diagnosis not present

## 2022-04-17 DIAGNOSIS — E876 Hypokalemia: Secondary | ICD-10-CM | POA: Diagnosis not present

## 2022-04-17 DIAGNOSIS — D631 Anemia in chronic kidney disease: Secondary | ICD-10-CM | POA: Diagnosis not present

## 2022-04-17 DIAGNOSIS — N186 End stage renal disease: Secondary | ICD-10-CM | POA: Diagnosis not present

## 2022-04-17 DIAGNOSIS — Z992 Dependence on renal dialysis: Secondary | ICD-10-CM | POA: Diagnosis not present

## 2022-04-17 NOTE — Telephone Encounter (Signed)
Precert is not  needed according to [facility] for ultrasound of thyroid  [facility] notified

## 2022-04-17 NOTE — Addendum Note (Signed)
Addended by: Valentina Shaggy on: 04/17/2022 01:05 PM   Modules accepted: Orders

## 2022-04-20 DIAGNOSIS — D631 Anemia in chronic kidney disease: Secondary | ICD-10-CM | POA: Diagnosis not present

## 2022-04-20 DIAGNOSIS — D509 Iron deficiency anemia, unspecified: Secondary | ICD-10-CM | POA: Diagnosis not present

## 2022-04-20 DIAGNOSIS — N2581 Secondary hyperparathyroidism of renal origin: Secondary | ICD-10-CM | POA: Diagnosis not present

## 2022-04-20 DIAGNOSIS — N186 End stage renal disease: Secondary | ICD-10-CM | POA: Diagnosis not present

## 2022-04-20 DIAGNOSIS — Z992 Dependence on renal dialysis: Secondary | ICD-10-CM | POA: Diagnosis not present

## 2022-04-20 DIAGNOSIS — E876 Hypokalemia: Secondary | ICD-10-CM | POA: Diagnosis not present

## 2022-04-22 DIAGNOSIS — D631 Anemia in chronic kidney disease: Secondary | ICD-10-CM | POA: Diagnosis not present

## 2022-04-22 DIAGNOSIS — D509 Iron deficiency anemia, unspecified: Secondary | ICD-10-CM | POA: Diagnosis not present

## 2022-04-22 DIAGNOSIS — N186 End stage renal disease: Secondary | ICD-10-CM | POA: Diagnosis not present

## 2022-04-22 DIAGNOSIS — N2581 Secondary hyperparathyroidism of renal origin: Secondary | ICD-10-CM | POA: Diagnosis not present

## 2022-04-22 DIAGNOSIS — E876 Hypokalemia: Secondary | ICD-10-CM | POA: Diagnosis not present

## 2022-04-22 DIAGNOSIS — Z992 Dependence on renal dialysis: Secondary | ICD-10-CM | POA: Diagnosis not present

## 2022-04-24 DIAGNOSIS — N186 End stage renal disease: Secondary | ICD-10-CM | POA: Diagnosis not present

## 2022-04-24 DIAGNOSIS — E876 Hypokalemia: Secondary | ICD-10-CM | POA: Diagnosis not present

## 2022-04-24 DIAGNOSIS — Z992 Dependence on renal dialysis: Secondary | ICD-10-CM | POA: Diagnosis not present

## 2022-04-24 DIAGNOSIS — N2581 Secondary hyperparathyroidism of renal origin: Secondary | ICD-10-CM | POA: Diagnosis not present

## 2022-04-24 DIAGNOSIS — D631 Anemia in chronic kidney disease: Secondary | ICD-10-CM | POA: Diagnosis not present

## 2022-04-24 DIAGNOSIS — D509 Iron deficiency anemia, unspecified: Secondary | ICD-10-CM | POA: Diagnosis not present

## 2022-04-26 DIAGNOSIS — N186 End stage renal disease: Secondary | ICD-10-CM | POA: Diagnosis not present

## 2022-04-26 DIAGNOSIS — I159 Secondary hypertension, unspecified: Secondary | ICD-10-CM | POA: Diagnosis not present

## 2022-04-26 DIAGNOSIS — Z992 Dependence on renal dialysis: Secondary | ICD-10-CM | POA: Diagnosis not present

## 2022-04-27 DIAGNOSIS — E876 Hypokalemia: Secondary | ICD-10-CM | POA: Diagnosis not present

## 2022-04-27 DIAGNOSIS — N2581 Secondary hyperparathyroidism of renal origin: Secondary | ICD-10-CM | POA: Diagnosis not present

## 2022-04-27 DIAGNOSIS — Z23 Encounter for immunization: Secondary | ICD-10-CM | POA: Diagnosis not present

## 2022-04-27 DIAGNOSIS — T7840XA Allergy, unspecified, initial encounter: Secondary | ICD-10-CM | POA: Diagnosis not present

## 2022-04-27 DIAGNOSIS — D631 Anemia in chronic kidney disease: Secondary | ICD-10-CM | POA: Diagnosis not present

## 2022-04-27 DIAGNOSIS — N186 End stage renal disease: Secondary | ICD-10-CM | POA: Diagnosis not present

## 2022-04-27 DIAGNOSIS — Z992 Dependence on renal dialysis: Secondary | ICD-10-CM | POA: Diagnosis not present

## 2022-04-27 DIAGNOSIS — D509 Iron deficiency anemia, unspecified: Secondary | ICD-10-CM | POA: Diagnosis not present

## 2022-04-28 ENCOUNTER — Ambulatory Visit: Payer: Self-pay

## 2022-04-29 DIAGNOSIS — N2581 Secondary hyperparathyroidism of renal origin: Secondary | ICD-10-CM | POA: Diagnosis not present

## 2022-04-29 DIAGNOSIS — E876 Hypokalemia: Secondary | ICD-10-CM | POA: Diagnosis not present

## 2022-04-29 DIAGNOSIS — D509 Iron deficiency anemia, unspecified: Secondary | ICD-10-CM | POA: Diagnosis not present

## 2022-04-29 DIAGNOSIS — N186 End stage renal disease: Secondary | ICD-10-CM | POA: Diagnosis not present

## 2022-04-29 DIAGNOSIS — Z992 Dependence on renal dialysis: Secondary | ICD-10-CM | POA: Diagnosis not present

## 2022-04-29 DIAGNOSIS — T7840XA Allergy, unspecified, initial encounter: Secondary | ICD-10-CM | POA: Diagnosis not present

## 2022-04-29 NOTE — Patient Instructions (Signed)
Visit Information  Thank you for taking time to visit with me today. Please don't hesitate to contact me if I can be of assistance to you.   Following are the goals we discussed today:   Goals Addressed             This Visit's Progress    .       Care Coordination Interventions: Reviewed scheduled/upcoming provider appointments including 9/26 at 10 am Discussed plans with patient for ongoing care management follow up and provided patient with direct contact information for care management team Active listening / Reflection utilized  Emotional Support Provided Problem [place] strategies reviewed During my phone conversation with Destiny Day, I informed her about my conversation with [employer]. They have a seat that can be attached to her walker, which costs only $36.95. [name] appreciated the information and mentioned that she would visit the supply store to check out the seats. This will be our last call, but in case she needs any assistance in the future, she can inform her physician.         Please call the care guide team at [phone] if you need to cancel or reschedule your appointment.   If you are experiencing a Mental Health or [place] or need someone to talk to, please call 1-800-273-TALK (toll free, 24 hour hotline)  Patient verbalizes understanding of instructions and care plan provided today and agrees to view in [facility]. Active MyChart status and patient understanding of how to access instructions and care plan via MyChart confirmed with patient.     No further follow up required:

## 2022-04-29 NOTE — Patient Outreach (Signed)
  Care Coordination   Follow Up Visit Note   04/28/22 Name: HEAVENLEIGH PETRUZZI MRN: [id number] DOB: 11-21-1950  [name] is a 71 y.o. year old female who sees Cipriano Mile, NP for primary care. I spoke with  [name] by phone today.  What matters to the patients health and wellness today?  I want a seat for my walker.    Goals Addressed             This Visit's Progress    .       Care Coordination Interventions: Reviewed scheduled/upcoming provider appointments including 9/26 at 10 am Discussed plans with patient for ongoing care management follow up and provided patient with direct contact information for care management team Active listening / Reflection utilized  Emotional Support Provided Problem [place] strategies reviewed During my phone conversation with Mrs. Geist, I informed her about my conversation with [employer]. They have a seat that can be attached to her walker, which costs only $36.95. Mrs. [place] appreciated the information and mentioned that she would visit the supply store to check out the seats. This will be our last call, but in case she needs any assistance in the future, she can inform her physician.        SDOH assessments and interventions completed:  No     Care Coordination Interventions Activated:  Yes  Care Coordination Interventions:  Yes, provided   Follow up plan: No further intervention required.   Encounter Outcome:  Pt. Visit Completed   Lazaro Arms RN, BSN, [facility]   Phone: [phone]

## 2022-05-01 DIAGNOSIS — T7840XA Allergy, unspecified, initial encounter: Secondary | ICD-10-CM | POA: Diagnosis not present

## 2022-05-01 DIAGNOSIS — Z992 Dependence on renal dialysis: Secondary | ICD-10-CM | POA: Diagnosis not present

## 2022-05-01 DIAGNOSIS — D509 Iron deficiency anemia, unspecified: Secondary | ICD-10-CM | POA: Diagnosis not present

## 2022-05-01 DIAGNOSIS — N2581 Secondary hyperparathyroidism of renal origin: Secondary | ICD-10-CM | POA: Diagnosis not present

## 2022-05-01 DIAGNOSIS — N186 End stage renal disease: Secondary | ICD-10-CM | POA: Diagnosis not present

## 2022-05-01 DIAGNOSIS — E876 Hypokalemia: Secondary | ICD-10-CM | POA: Diagnosis not present

## 2022-05-04 DIAGNOSIS — E876 Hypokalemia: Secondary | ICD-10-CM | POA: Diagnosis not present

## 2022-05-04 DIAGNOSIS — E1122 Type 2 diabetes mellitus with diabetic chronic kidney disease: Secondary | ICD-10-CM | POA: Diagnosis not present

## 2022-05-04 DIAGNOSIS — Z992 Dependence on renal dialysis: Secondary | ICD-10-CM | POA: Diagnosis not present

## 2022-05-04 DIAGNOSIS — D509 Iron deficiency anemia, unspecified: Secondary | ICD-10-CM | POA: Diagnosis not present

## 2022-05-04 DIAGNOSIS — N186 End stage renal disease: Secondary | ICD-10-CM | POA: Diagnosis not present

## 2022-05-04 DIAGNOSIS — T7840XA Allergy, unspecified, initial encounter: Secondary | ICD-10-CM | POA: Diagnosis not present

## 2022-05-04 DIAGNOSIS — N2581 Secondary hyperparathyroidism of renal origin: Secondary | ICD-10-CM | POA: Diagnosis not present

## 2022-05-05 DIAGNOSIS — Z961 Presence of intraocular lens: Secondary | ICD-10-CM | POA: Diagnosis not present

## 2022-05-05 DIAGNOSIS — E113513 Type 2 diabetes mellitus with proliferative diabetic retinopathy with macular edema, bilateral: Secondary | ICD-10-CM | POA: Diagnosis not present

## 2022-05-05 DIAGNOSIS — H0288B Meibomian gland dysfunction left eye, upper and lower eyelids: Secondary | ICD-10-CM | POA: Diagnosis not present

## 2022-05-05 DIAGNOSIS — H0288A Meibomian gland dysfunction right eye, upper and lower eyelids: Secondary | ICD-10-CM | POA: Diagnosis not present

## 2022-05-06 DIAGNOSIS — E876 Hypokalemia: Secondary | ICD-10-CM | POA: Diagnosis not present

## 2022-05-06 DIAGNOSIS — N186 End stage renal disease: Secondary | ICD-10-CM | POA: Diagnosis not present

## 2022-05-06 DIAGNOSIS — T7840XA Allergy, unspecified, initial encounter: Secondary | ICD-10-CM | POA: Diagnosis not present

## 2022-05-06 DIAGNOSIS — N2581 Secondary hyperparathyroidism of renal origin: Secondary | ICD-10-CM | POA: Diagnosis not present

## 2022-05-06 DIAGNOSIS — Z992 Dependence on renal dialysis: Secondary | ICD-10-CM | POA: Diagnosis not present

## 2022-05-06 DIAGNOSIS — D509 Iron deficiency anemia, unspecified: Secondary | ICD-10-CM | POA: Diagnosis not present

## 2022-05-07 ENCOUNTER — Ambulatory Visit (INDEPENDENT_AMBULATORY_CARE_PROVIDER_SITE_OTHER): Payer: Medicare Other

## 2022-05-07 DIAGNOSIS — J309 Allergic rhinitis, unspecified: Secondary | ICD-10-CM

## 2022-05-08 DIAGNOSIS — N2581 Secondary hyperparathyroidism of renal origin: Secondary | ICD-10-CM | POA: Diagnosis not present

## 2022-05-08 DIAGNOSIS — Z992 Dependence on renal dialysis: Secondary | ICD-10-CM | POA: Diagnosis not present

## 2022-05-08 DIAGNOSIS — E876 Hypokalemia: Secondary | ICD-10-CM | POA: Diagnosis not present

## 2022-05-08 DIAGNOSIS — T7840XA Allergy, unspecified, initial encounter: Secondary | ICD-10-CM | POA: Diagnosis not present

## 2022-05-08 DIAGNOSIS — D509 Iron deficiency anemia, unspecified: Secondary | ICD-10-CM | POA: Diagnosis not present

## 2022-05-08 DIAGNOSIS — N186 End stage renal disease: Secondary | ICD-10-CM | POA: Diagnosis not present

## 2022-05-11 DIAGNOSIS — N2581 Secondary hyperparathyroidism of renal origin: Secondary | ICD-10-CM | POA: Diagnosis not present

## 2022-05-11 DIAGNOSIS — Z992 Dependence on renal dialysis: Secondary | ICD-10-CM | POA: Diagnosis not present

## 2022-05-11 DIAGNOSIS — E876 Hypokalemia: Secondary | ICD-10-CM | POA: Diagnosis not present

## 2022-05-11 DIAGNOSIS — T7840XA Allergy, unspecified, initial encounter: Secondary | ICD-10-CM | POA: Diagnosis not present

## 2022-05-11 DIAGNOSIS — D509 Iron deficiency anemia, unspecified: Secondary | ICD-10-CM | POA: Diagnosis not present

## 2022-05-11 DIAGNOSIS — N186 End stage renal disease: Secondary | ICD-10-CM | POA: Diagnosis not present

## 2022-05-13 DIAGNOSIS — Z992 Dependence on renal dialysis: Secondary | ICD-10-CM | POA: Diagnosis not present

## 2022-05-13 DIAGNOSIS — E876 Hypokalemia: Secondary | ICD-10-CM | POA: Diagnosis not present

## 2022-05-13 DIAGNOSIS — N186 End stage renal disease: Secondary | ICD-10-CM | POA: Diagnosis not present

## 2022-05-13 DIAGNOSIS — J3081 Allergic rhinitis due to animal (cat) (dog) hair and dander: Secondary | ICD-10-CM | POA: Diagnosis not present

## 2022-05-13 DIAGNOSIS — N2581 Secondary hyperparathyroidism of renal origin: Secondary | ICD-10-CM | POA: Diagnosis not present

## 2022-05-13 DIAGNOSIS — D509 Iron deficiency anemia, unspecified: Secondary | ICD-10-CM | POA: Diagnosis not present

## 2022-05-13 DIAGNOSIS — T7840XA Allergy, unspecified, initial encounter: Secondary | ICD-10-CM | POA: Diagnosis not present

## 2022-05-13 NOTE — Progress Notes (Signed)
VIALS EXP 05-14-23 

## 2022-05-14 DIAGNOSIS — J3089 Other allergic rhinitis: Secondary | ICD-10-CM | POA: Diagnosis not present

## 2022-05-15 DIAGNOSIS — T7840XA Allergy, unspecified, initial encounter: Secondary | ICD-10-CM | POA: Diagnosis not present

## 2022-05-15 DIAGNOSIS — N2581 Secondary hyperparathyroidism of renal origin: Secondary | ICD-10-CM | POA: Diagnosis not present

## 2022-05-15 DIAGNOSIS — D509 Iron deficiency anemia, unspecified: Secondary | ICD-10-CM | POA: Diagnosis not present

## 2022-05-15 DIAGNOSIS — Z992 Dependence on renal dialysis: Secondary | ICD-10-CM | POA: Diagnosis not present

## 2022-05-15 DIAGNOSIS — E876 Hypokalemia: Secondary | ICD-10-CM | POA: Diagnosis not present

## 2022-05-15 DIAGNOSIS — N186 End stage renal disease: Secondary | ICD-10-CM | POA: Diagnosis not present

## 2022-05-18 DIAGNOSIS — T7840XA Allergy, unspecified, initial encounter: Secondary | ICD-10-CM | POA: Diagnosis not present

## 2022-05-18 DIAGNOSIS — D509 Iron deficiency anemia, unspecified: Secondary | ICD-10-CM | POA: Diagnosis not present

## 2022-05-18 DIAGNOSIS — E876 Hypokalemia: Secondary | ICD-10-CM | POA: Diagnosis not present

## 2022-05-18 DIAGNOSIS — Z992 Dependence on renal dialysis: Secondary | ICD-10-CM | POA: Diagnosis not present

## 2022-05-18 DIAGNOSIS — N186 End stage renal disease: Secondary | ICD-10-CM | POA: Diagnosis not present

## 2022-05-18 DIAGNOSIS — N2581 Secondary hyperparathyroidism of renal origin: Secondary | ICD-10-CM | POA: Diagnosis not present

## 2022-05-19 DIAGNOSIS — M1990 Unspecified osteoarthritis, unspecified site: Secondary | ICD-10-CM | POA: Diagnosis not present

## 2022-05-19 DIAGNOSIS — E1140 Type 2 diabetes mellitus with diabetic neuropathy, unspecified: Secondary | ICD-10-CM | POA: Diagnosis not present

## 2022-05-19 DIAGNOSIS — Z992 Dependence on renal dialysis: Secondary | ICD-10-CM | POA: Diagnosis not present

## 2022-05-19 DIAGNOSIS — Z89421 Acquired absence of other right toe(s): Secondary | ICD-10-CM | POA: Diagnosis not present

## 2022-05-19 DIAGNOSIS — I132 Hypertensive heart and chronic kidney disease with heart failure and with stage 5 chronic kidney disease, or end stage renal disease: Secondary | ICD-10-CM | POA: Diagnosis not present

## 2022-05-19 DIAGNOSIS — I5030 Unspecified diastolic (congestive) heart failure: Secondary | ICD-10-CM | POA: Diagnosis not present

## 2022-05-19 DIAGNOSIS — Z7984 Long term (current) use of oral hypoglycemic drugs: Secondary | ICD-10-CM | POA: Diagnosis not present

## 2022-05-19 DIAGNOSIS — E1151 Type 2 diabetes mellitus with diabetic peripheral angiopathy without gangrene: Secondary | ICD-10-CM | POA: Diagnosis not present

## 2022-05-19 DIAGNOSIS — Z87891 Personal history of nicotine dependence: Secondary | ICD-10-CM | POA: Diagnosis not present

## 2022-05-19 DIAGNOSIS — E1122 Type 2 diabetes mellitus with diabetic chronic kidney disease: Secondary | ICD-10-CM | POA: Diagnosis not present

## 2022-05-19 DIAGNOSIS — F419 Anxiety disorder, unspecified: Secondary | ICD-10-CM | POA: Diagnosis not present

## 2022-05-19 DIAGNOSIS — G4733 Obstructive sleep apnea (adult) (pediatric): Secondary | ICD-10-CM | POA: Diagnosis not present

## 2022-05-19 DIAGNOSIS — N186 End stage renal disease: Secondary | ICD-10-CM | POA: Diagnosis not present

## 2022-05-19 DIAGNOSIS — E785 Hyperlipidemia, unspecified: Secondary | ICD-10-CM | POA: Diagnosis not present

## 2022-05-19 DIAGNOSIS — Z7982 Long term (current) use of aspirin: Secondary | ICD-10-CM | POA: Diagnosis not present

## 2022-05-20 DIAGNOSIS — N186 End stage renal disease: Secondary | ICD-10-CM | POA: Diagnosis not present

## 2022-05-20 DIAGNOSIS — T7840XA Allergy, unspecified, initial encounter: Secondary | ICD-10-CM | POA: Diagnosis not present

## 2022-05-20 DIAGNOSIS — E876 Hypokalemia: Secondary | ICD-10-CM | POA: Diagnosis not present

## 2022-05-20 DIAGNOSIS — D509 Iron deficiency anemia, unspecified: Secondary | ICD-10-CM | POA: Diagnosis not present

## 2022-05-20 DIAGNOSIS — Z992 Dependence on renal dialysis: Secondary | ICD-10-CM | POA: Diagnosis not present

## 2022-05-20 DIAGNOSIS — N2581 Secondary hyperparathyroidism of renal origin: Secondary | ICD-10-CM | POA: Diagnosis not present

## 2022-05-21 ENCOUNTER — Encounter (INDEPENDENT_AMBULATORY_CARE_PROVIDER_SITE_OTHER): Payer: Medicare Other | Admitting: Ophthalmology

## 2022-05-21 DIAGNOSIS — E113511 Type 2 diabetes mellitus with proliferative diabetic retinopathy with macular edema, right eye: Secondary | ICD-10-CM | POA: Diagnosis not present

## 2022-05-21 DIAGNOSIS — E113512 Type 2 diabetes mellitus with proliferative diabetic retinopathy with macular edema, left eye: Secondary | ICD-10-CM | POA: Diagnosis not present

## 2022-05-21 DIAGNOSIS — H35371 Puckering of macula, right eye: Secondary | ICD-10-CM | POA: Diagnosis not present

## 2022-05-21 DIAGNOSIS — H401121 Primary open-angle glaucoma, left eye, mild stage: Secondary | ICD-10-CM | POA: Diagnosis not present

## 2022-05-22 DIAGNOSIS — D509 Iron deficiency anemia, unspecified: Secondary | ICD-10-CM | POA: Diagnosis not present

## 2022-05-22 DIAGNOSIS — E876 Hypokalemia: Secondary | ICD-10-CM | POA: Diagnosis not present

## 2022-05-22 DIAGNOSIS — N186 End stage renal disease: Secondary | ICD-10-CM | POA: Diagnosis not present

## 2022-05-22 DIAGNOSIS — Z992 Dependence on renal dialysis: Secondary | ICD-10-CM | POA: Diagnosis not present

## 2022-05-22 DIAGNOSIS — N2581 Secondary hyperparathyroidism of renal origin: Secondary | ICD-10-CM | POA: Diagnosis not present

## 2022-05-22 DIAGNOSIS — T7840XA Allergy, unspecified, initial encounter: Secondary | ICD-10-CM | POA: Diagnosis not present

## 2022-05-25 DIAGNOSIS — D509 Iron deficiency anemia, unspecified: Secondary | ICD-10-CM | POA: Diagnosis not present

## 2022-05-25 DIAGNOSIS — T7840XA Allergy, unspecified, initial encounter: Secondary | ICD-10-CM | POA: Diagnosis not present

## 2022-05-25 DIAGNOSIS — N186 End stage renal disease: Secondary | ICD-10-CM | POA: Diagnosis not present

## 2022-05-25 DIAGNOSIS — E876 Hypokalemia: Secondary | ICD-10-CM | POA: Diagnosis not present

## 2022-05-25 DIAGNOSIS — N2581 Secondary hyperparathyroidism of renal origin: Secondary | ICD-10-CM | POA: Diagnosis not present

## 2022-05-25 DIAGNOSIS — Z992 Dependence on renal dialysis: Secondary | ICD-10-CM | POA: Diagnosis not present

## 2022-05-26 DIAGNOSIS — N186 End stage renal disease: Secondary | ICD-10-CM | POA: Diagnosis not present

## 2022-05-26 DIAGNOSIS — Z992 Dependence on renal dialysis: Secondary | ICD-10-CM | POA: Diagnosis not present

## 2022-05-26 DIAGNOSIS — E1140 Type 2 diabetes mellitus with diabetic neuropathy, unspecified: Secondary | ICD-10-CM | POA: Diagnosis not present

## 2022-05-26 DIAGNOSIS — I5030 Unspecified diastolic (congestive) heart failure: Secondary | ICD-10-CM | POA: Diagnosis not present

## 2022-05-26 DIAGNOSIS — I132 Hypertensive heart and chronic kidney disease with heart failure and with stage 5 chronic kidney disease, or end stage renal disease: Secondary | ICD-10-CM | POA: Diagnosis not present

## 2022-05-26 DIAGNOSIS — E1122 Type 2 diabetes mellitus with diabetic chronic kidney disease: Secondary | ICD-10-CM | POA: Diagnosis not present

## 2022-05-27 DIAGNOSIS — I5030 Unspecified diastolic (congestive) heart failure: Secondary | ICD-10-CM | POA: Diagnosis not present

## 2022-05-27 DIAGNOSIS — E041 Nontoxic single thyroid nodule: Secondary | ICD-10-CM | POA: Diagnosis not present

## 2022-05-27 DIAGNOSIS — Z992 Dependence on renal dialysis: Secondary | ICD-10-CM | POA: Diagnosis not present

## 2022-05-27 DIAGNOSIS — E1140 Type 2 diabetes mellitus with diabetic neuropathy, unspecified: Secondary | ICD-10-CM | POA: Diagnosis not present

## 2022-05-27 DIAGNOSIS — D509 Iron deficiency anemia, unspecified: Secondary | ICD-10-CM | POA: Diagnosis not present

## 2022-05-27 DIAGNOSIS — N186 End stage renal disease: Secondary | ICD-10-CM | POA: Diagnosis not present

## 2022-05-27 DIAGNOSIS — I159 Secondary hypertension, unspecified: Secondary | ICD-10-CM | POA: Diagnosis not present

## 2022-05-27 DIAGNOSIS — N2581 Secondary hyperparathyroidism of renal origin: Secondary | ICD-10-CM | POA: Diagnosis not present

## 2022-05-27 DIAGNOSIS — I132 Hypertensive heart and chronic kidney disease with heart failure and with stage 5 chronic kidney disease, or end stage renal disease: Secondary | ICD-10-CM | POA: Diagnosis not present

## 2022-05-27 DIAGNOSIS — D631 Anemia in chronic kidney disease: Secondary | ICD-10-CM | POA: Diagnosis not present

## 2022-05-27 DIAGNOSIS — E1122 Type 2 diabetes mellitus with diabetic chronic kidney disease: Secondary | ICD-10-CM | POA: Diagnosis not present

## 2022-05-28 ENCOUNTER — Other Ambulatory Visit (HOSPITAL_COMMUNITY)
Admission: RE | Admit: 2022-05-28 | Discharge: 2022-05-28 | Disposition: A | Discharge status: Home / Self Care | Payer: Medicare Other | Source: Ambulatory Visit | Attending: Allergy & Immunology | Admitting: Allergy & Immunology

## 2022-05-28 ENCOUNTER — Ambulatory Visit (INDEPENDENT_AMBULATORY_CARE_PROVIDER_SITE_OTHER): Payer: Self-pay | Admitting: Ophthalmology

## 2022-05-28 ENCOUNTER — Ambulatory Visit
Admission: RE | Admit: 2022-05-28 | Discharge: 2022-05-28 | Disposition: A | Discharge status: Home / Self Care | Payer: Medicare Other | Source: Ambulatory Visit | Attending: Allergy & Immunology | Admitting: Allergy & Immunology

## 2022-05-28 ENCOUNTER — Other Ambulatory Visit (HOSPITAL_COMMUNITY): Payer: Self-pay

## 2022-05-28 ENCOUNTER — Encounter (HOSPITAL_COMMUNITY): Payer: Self-pay | Admitting: Ophthalmology

## 2022-05-28 ENCOUNTER — Other Ambulatory Visit: Payer: Self-pay

## 2022-05-28 DIAGNOSIS — H35371 Puckering of macula, right eye: Secondary | ICD-10-CM

## 2022-05-28 DIAGNOSIS — I132 Hypertensive heart and chronic kidney disease with heart failure and with stage 5 chronic kidney disease, or end stage renal disease: Secondary | ICD-10-CM | POA: Diagnosis not present

## 2022-05-28 DIAGNOSIS — E041 Nontoxic single thyroid nodule: Secondary | ICD-10-CM | POA: Diagnosis not present

## 2022-05-28 DIAGNOSIS — E113521 Type 2 diabetes mellitus with proliferative diabetic retinopathy with traction retinal detachment involving the macula, right eye: Secondary | ICD-10-CM

## 2022-05-28 DIAGNOSIS — E042 Nontoxic multinodular goiter: Secondary | ICD-10-CM

## 2022-05-28 DIAGNOSIS — N186 End stage renal disease: Secondary | ICD-10-CM | POA: Diagnosis not present

## 2022-05-28 DIAGNOSIS — E113511 Type 2 diabetes mellitus with proliferative diabetic retinopathy with macular edema, right eye: Secondary | ICD-10-CM

## 2022-05-28 DIAGNOSIS — I5030 Unspecified diastolic (congestive) heart failure: Secondary | ICD-10-CM | POA: Diagnosis not present

## 2022-05-28 DIAGNOSIS — Z992 Dependence on renal dialysis: Secondary | ICD-10-CM | POA: Diagnosis not present

## 2022-05-28 DIAGNOSIS — E1140 Type 2 diabetes mellitus with diabetic neuropathy, unspecified: Secondary | ICD-10-CM | POA: Diagnosis not present

## 2022-05-28 DIAGNOSIS — E1122 Type 2 diabetes mellitus with diabetic chronic kidney disease: Secondary | ICD-10-CM | POA: Diagnosis not present

## 2022-05-28 NOTE — Progress Notes (Addendum)
Ms Destiny Day denies chest pain or shortness of breath.Patient denies having any s/s of Covid in her household, also denies any known exposure to Covid.   Ms.Destiny Day has sleep apnea, she cannot wear the mask.  Ms Destiny Day has type II diabetes, patient takes Glipizide in the evening, I instructed patient to hold the glipizide tonight. Ms Destiny Day does not check CBG.  Ms Destiny Day has COPD and has three inhalers, patient does not have any of the inhalers, she cannot afford them.  Ms Destiny Day  is on dialysis, she goes to dialysis, she is scheduled to have dialysis on Friday, no arrangements have been made for patient to make it up on Sat.I asked if patient is going to call to see if she can have dialysis on Sat, Ms Destiny Day said that she has to go to Dr. Dahlia Bailiff office on Saturday and she does not know what time.  I called Dr. Dahlia Bailiff office and spoke with Claiborne Billings asking for orders. I explained to Destiny Day that patient needs to know when she will see Dr. Zadie Rhine on Saturday, because she has to make transportation arrangements and she has to see if she can have dialysis on Sat.Claiborne Billings asked me to leave this information on scheduler's  voice mail, which I did.  Ms Destiny Day 's PCP is Cipriano Mile, NP.  Cardiologist is Dr. Einar Gip.

## 2022-05-29 ENCOUNTER — Ambulatory Visit (HOSPITAL_COMMUNITY): Payer: Self-pay | Admitting: Ophthalmology

## 2022-05-29 ENCOUNTER — Ambulatory Visit (HOSPITAL_COMMUNITY): Payer: Medicare Other | Admitting: Anesthesiology

## 2022-05-29 ENCOUNTER — Encounter (HOSPITAL_COMMUNITY): Payer: Self-pay | Admitting: Ophthalmology

## 2022-05-29 ENCOUNTER — Ambulatory Visit (HOSPITAL_BASED_OUTPATIENT_CLINIC_OR_DEPARTMENT_OTHER): Payer: Medicare Other | Admitting: Anesthesiology

## 2022-05-29 ENCOUNTER — Encounter (HOSPITAL_COMMUNITY)
Admission: RE | Disposition: A | Discharge status: Home / Self Care | Payer: Self-pay | Source: Home / Self Care | Attending: Ophthalmology

## 2022-05-29 ENCOUNTER — Ambulatory Visit (HOSPITAL_COMMUNITY)
Admission: RE | Admit: 2022-05-29 | Discharge: 2022-05-29 | Disposition: A | Discharge status: Home / Self Care | Payer: Medicare Other | Attending: Ophthalmology | Admitting: Ophthalmology

## 2022-05-29 DIAGNOSIS — F419 Anxiety disorder, unspecified: Secondary | ICD-10-CM | POA: Diagnosis not present

## 2022-05-29 DIAGNOSIS — H5461 Unqualified visual loss, right eye, normal vision left eye: Secondary | ICD-10-CM | POA: Diagnosis not present

## 2022-05-29 DIAGNOSIS — E1122 Type 2 diabetes mellitus with diabetic chronic kidney disease: Secondary | ICD-10-CM

## 2022-05-29 DIAGNOSIS — H35371 Puckering of macula, right eye: Secondary | ICD-10-CM

## 2022-05-29 DIAGNOSIS — Z992 Dependence on renal dialysis: Secondary | ICD-10-CM

## 2022-05-29 DIAGNOSIS — Z794 Long term (current) use of insulin: Secondary | ICD-10-CM | POA: Diagnosis not present

## 2022-05-29 DIAGNOSIS — E113511 Type 2 diabetes mellitus with proliferative diabetic retinopathy with macular edema, right eye: Secondary | ICD-10-CM | POA: Diagnosis not present

## 2022-05-29 DIAGNOSIS — E113591 Type 2 diabetes mellitus with proliferative diabetic retinopathy without macular edema, right eye: Secondary | ICD-10-CM | POA: Diagnosis not present

## 2022-05-29 DIAGNOSIS — G4730 Sleep apnea, unspecified: Secondary | ICD-10-CM | POA: Diagnosis not present

## 2022-05-29 DIAGNOSIS — M1990 Unspecified osteoarthritis, unspecified site: Secondary | ICD-10-CM | POA: Diagnosis not present

## 2022-05-29 DIAGNOSIS — I132 Hypertensive heart and chronic kidney disease with heart failure and with stage 5 chronic kidney disease, or end stage renal disease: Secondary | ICD-10-CM | POA: Insufficient documentation

## 2022-05-29 DIAGNOSIS — K219 Gastro-esophageal reflux disease without esophagitis: Secondary | ICD-10-CM | POA: Diagnosis not present

## 2022-05-29 DIAGNOSIS — J449 Chronic obstructive pulmonary disease, unspecified: Secondary | ICD-10-CM | POA: Insufficient documentation

## 2022-05-29 DIAGNOSIS — N186 End stage renal disease: Secondary | ICD-10-CM

## 2022-05-29 DIAGNOSIS — Z7984 Long term (current) use of oral hypoglycemic drugs: Secondary | ICD-10-CM | POA: Diagnosis not present

## 2022-05-29 DIAGNOSIS — E1151 Type 2 diabetes mellitus with diabetic peripheral angiopathy without gangrene: Secondary | ICD-10-CM | POA: Insufficient documentation

## 2022-05-29 DIAGNOSIS — I509 Heart failure, unspecified: Secondary | ICD-10-CM | POA: Diagnosis not present

## 2022-05-29 DIAGNOSIS — Z87891 Personal history of nicotine dependence: Secondary | ICD-10-CM | POA: Diagnosis not present

## 2022-05-29 HISTORY — PX: PARS PLANA VITRECTOMY: SHX2166

## 2022-05-29 HISTORY — DX: Chronic obstructive pulmonary disease, unspecified: J44.9

## 2022-05-29 HISTORY — PX: MEMBRANE PEEL: SHX5967

## 2022-05-29 LAB — POCT I-STAT, CHEM 8
BUN: 34 mg/dL — ABNORMAL HIGH (ref 8–23)
Calcium, Ion: 1.1 mmol/L — ABNORMAL LOW (ref 1.15–1.40)
Chloride: 96 mmol/L — ABNORMAL LOW (ref 98–111)
Creatinine, Ser: 6.5 mg/dL — ABNORMAL HIGH (ref 0.44–1.00)
Glucose, Bld: 89 mg/dL (ref 70–99)
HCT: 41 % (ref 36.0–46.0)
Hemoglobin: 13.9 g/dL (ref 12.0–15.0)
Potassium: 3.9 mmol/L (ref 3.5–5.1)
Sodium: 132 mmol/L — ABNORMAL LOW (ref 135–145)
TCO2: 25 mmol/L (ref 22–32)

## 2022-05-29 LAB — GLUCOSE, CAPILLARY
Glucose-Capillary: 93 mg/dL (ref 70–99)
Glucose-Capillary: 84 mg/dL (ref 70–99)

## 2022-05-29 SURGERY — PARS PLANA VITRECTOMY WITH 25 GAUGE
Anesthesia: Monitor Anesthesia Care | Laterality: Right

## 2022-05-29 SURGERY — PARS PLANA VITRECTOMY WITH 25 GAUGE
Anesthesia: Monitor Anesthesia Care | Site: Eye | Laterality: Right

## 2022-05-29 MED ORDER — BSS IO SOLN
INTRAOCULAR | Status: DC | PRN
  Administered 2022-05-29: 15 mL

## 2022-05-29 MED ORDER — FENTANYL CITRATE (PF) 250 MCG/5ML IJ SOLN
INTRAMUSCULAR | Status: AC
  Filled 2022-05-29: qty 5

## 2022-05-29 MED ORDER — GENTAMICIN SULFATE 40 MG/ML IJ SOLN
INTRAMUSCULAR | Status: AC
  Filled 2022-05-29: qty 2

## 2022-05-29 MED ORDER — POLYMYXIN B SULFATE 500000 UNITS IJ SOLR
INTRAMUSCULAR | Status: AC
  Filled 2022-05-29: qty 10

## 2022-05-29 MED ORDER — ONDANSETRON HCL 4 MG/2ML IJ SOLN
INTRAMUSCULAR | Status: DC | PRN
  Administered 2022-05-29: 4 mg via INTRAVENOUS

## 2022-05-29 MED ORDER — CHLORHEXIDINE GLUCONATE 0.12 % MT SOLN
15.0000 mL | Freq: Once | OROMUCOSAL | Status: AC
  Administered 2022-05-29: 15 mL via OROMUCOSAL
  Filled 2022-05-29: qty 15

## 2022-05-29 MED ORDER — TETRACAINE HCL 0.5 % OP SOLN
OPHTHALMIC | Status: AC
  Filled 2022-05-29: qty 4

## 2022-05-29 MED ORDER — BACITRACIN-POLYMYXIN B 500-10000 UNIT/GM OP OINT
TOPICAL_OINTMENT | OPHTHALMIC | Status: AC
  Filled 2022-05-29: qty 3.5

## 2022-05-29 MED ORDER — TOBRAMYCIN-DEXAMETHASONE 0.3-0.1 % OP OINT
TOPICAL_OINTMENT | OPHTHALMIC | Status: AC
  Filled 2022-05-29: qty 3.5

## 2022-05-29 MED ORDER — LIDOCAINE HCL 2 % IJ SOLN
INTRAMUSCULAR | Status: AC
  Filled 2022-05-29: qty 20

## 2022-05-29 MED ORDER — MIDAZOLAM HCL 2 MG/2ML IJ SOLN
INTRAMUSCULAR | Status: AC
  Filled 2022-05-29: qty 2

## 2022-05-29 MED ORDER — BRILLIANT BLUE G 0.025 % IO SOSY
PREFILLED_SYRINGE | INTRAOCULAR | Status: DC | PRN
  Administered 2022-05-29: 0.5 mL via INTRAVITREAL

## 2022-05-29 MED ORDER — BSS PLUS IO SOLN
INTRAOCULAR | Status: AC
  Filled 2022-05-29: qty 500

## 2022-05-29 MED ORDER — EPINEPHRINE PF 1 MG/ML IJ SOLN
INTRAMUSCULAR | Status: AC
  Filled 2022-05-29: qty 1

## 2022-05-29 MED ORDER — ORAL CARE MOUTH RINSE: 15.0000 mL | Freq: Once | OROMUCOSAL | Status: AC

## 2022-05-29 MED ORDER — GATIFLOXACIN 0.5 % OP SOLN
1.0000 [drp] | OPHTHALMIC | Status: AC | PRN
  Administered 2022-05-29 (×3): 1 [drp] via OPHTHALMIC
  Filled 2022-05-29: qty 2.5

## 2022-05-29 MED ORDER — DEXTROSE 5 % IV SOLN
INTRAVENOUS | Status: AC
  Filled 2022-05-29: qty 100

## 2022-05-29 MED ORDER — INDOCYANINE GREEN 25 MG IV SOLR
INTRAVENOUS | Status: AC
  Filled 2022-05-29: qty 10

## 2022-05-29 MED ORDER — STERILE WATER FOR INJECTION IJ SOLN
INTRAMUSCULAR | Status: AC
  Filled 2022-05-29: qty 10

## 2022-05-29 MED ORDER — ACETAMINOPHEN 500 MG PO TABS
1000.0000 mg | ORAL_TABLET | Freq: Once | ORAL | Status: AC
  Administered 2022-05-29: 1000 mg via ORAL
  Filled 2022-05-29: qty 2

## 2022-05-29 MED ORDER — FENTANYL CITRATE (PF) 250 MCG/5ML IJ SOLN
INTRAMUSCULAR | Status: DC | PRN
  Administered 2022-05-29: 50 ug via INTRAVENOUS

## 2022-05-29 MED ORDER — DEXAMETHASONE SODIUM PHOSPHATE 10 MG/ML IJ SOLN
INTRAMUSCULAR | Status: DC | PRN
  Administered 2022-05-29: 10 mg

## 2022-05-29 MED ORDER — EPINEPHRINE PF 1 MG/ML IJ SOLN
INTRAOCULAR | Status: DC | PRN
  Administered 2022-05-29: 500.3 mL

## 2022-05-29 MED ORDER — CYCLOPENTOLATE HCL 1 % OP SOLN
1.0000 [drp] | OPHTHALMIC | Status: AC
  Administered 2022-05-29 (×3): 1 [drp] via OPHTHALMIC
  Filled 2022-05-29: qty 2

## 2022-05-29 MED ORDER — NA CHONDROIT SULF-NA HYALURON 40-30 MG/ML IO SOSY
INTRAOCULAR | Status: AC
  Filled 2022-05-29: qty 0.5

## 2022-05-29 MED ORDER — PROPOFOL 10 MG/ML IV BOLUS
INTRAVENOUS | Status: AC
  Filled 2022-05-29: qty 20

## 2022-05-29 MED ORDER — NA CHONDROIT SULF-NA HYALURON 40-30 MG/ML IO SOSY
INTRAOCULAR | Status: DC | PRN
  Administered 2022-05-29: 0.5 mL via INTRAOCULAR

## 2022-05-29 MED ORDER — TOBRAMYCIN-DEXAMETHASONE 0.3-0.1 % OP OINT
TOPICAL_OINTMENT | OPHTHALMIC | Status: DC | PRN
  Administered 2022-05-29: 1 via OPHTHALMIC

## 2022-05-29 MED ORDER — DEXAMETHASONE SODIUM PHOSPHATE 10 MG/ML IJ SOLN
INTRAMUSCULAR | Status: AC
  Filled 2022-05-29: qty 1

## 2022-05-29 MED ORDER — PHENYLEPHRINE HCL 2.5 % OP SOLN
1.0000 [drp] | OPHTHALMIC | Status: AC | PRN
  Administered 2022-05-29 (×3): 1 [drp] via OPHTHALMIC
  Filled 2022-05-29: qty 2

## 2022-05-29 MED ORDER — SODIUM CHLORIDE 0.9 % IV SOLN: INTRAVENOUS | Status: DC

## 2022-05-29 MED ORDER — LIDOCAINE HCL 2 % IJ SOLN
INTRAMUSCULAR | Status: DC | PRN
  Administered 2022-05-29: 8 mL

## 2022-05-29 MED ORDER — MIDAZOLAM HCL 2 MG/2ML IJ SOLN
INTRAMUSCULAR | Status: DC | PRN
  Administered 2022-05-29: 1 mg via INTRAVENOUS

## 2022-05-29 MED ORDER — BSS IO SOLN
INTRAOCULAR | Status: AC
  Filled 2022-05-29: qty 15

## 2022-05-29 SURGICAL SUPPLY — 31 items
APPLICATOR COTTON TIP 6 STRL (MISCELLANEOUS) ×1 IMPLANT
APPLICATOR COTTON TIP 6IN STRL (MISCELLANEOUS) ×1
BAG COUNTER SPONGE SURGICOUNT (BAG) ×1 IMPLANT
BNDG EYE OVAL (GAUZE/BANDAGES/DRESSINGS) IMPLANT
CANNULA VLV SOFT TIP 25G (OPHTHALMIC) ×1 IMPLANT
CANNULA VLV SOFT TIP 25GA (OPHTHALMIC) ×1 IMPLANT
DRAPE INCISE 51X51 W/FILM STRL (DRAPES) IMPLANT
DRAPE OPHTHALMIC 77X100 STRL (CUSTOM PROCEDURE TRAY) ×1 IMPLANT
FORCEPS GRIESHABER ILM 25G A (INSTRUMENTS) IMPLANT
FORCEPS HORIZONTAL 25G DISP (OPHTHALMIC RELATED) IMPLANT
GLOVE SS BIOGEL STRL SZ 8.5 (GLOVE) ×1 IMPLANT
GOWN STRL REUS W/ TWL LRG LVL3 (GOWN DISPOSABLE) ×1 IMPLANT
GOWN STRL REUS W/ TWL XL LVL3 (GOWN DISPOSABLE) ×1 IMPLANT
GOWN STRL REUS W/TWL LRG LVL3 (GOWN DISPOSABLE) ×1
GOWN STRL REUS W/TWL XL LVL3 (GOWN DISPOSABLE) ×1
KIT BASIN OR (CUSTOM PROCEDURE TRAY) ×1 IMPLANT
LENS BIOM SUPER VIEW SET DISP (MISCELLANEOUS) ×1 IMPLANT
NDL 18GX1X1/2 (RX/OR ONLY) (NEEDLE) IMPLANT
NDL FILTER BLUNT 18X1 1/2 (NEEDLE) IMPLANT
NDL RETROBULBAR 25GX1.5 (NEEDLE) IMPLANT
NEEDLE 18GX1X1/2 (RX/OR ONLY) (NEEDLE) ×1 IMPLANT
NEEDLE FILTER BLUNT 18X1 1/2 (NEEDLE) ×1 IMPLANT
NEEDLE RETROBULBAR 25GX1.5 (NEEDLE) ×1 IMPLANT
NS IRRIG 1000ML POUR BTL (IV SOLUTION) ×1 IMPLANT
PACK VITRECTOMY CUSTOM (CUSTOM PROCEDURE TRAY) ×1 IMPLANT
PAD ARMBOARD 7.5X6 YLW CONV (MISCELLANEOUS) ×2 IMPLANT
PAK PIK VITRECTOMY CVS 25GA (OPHTHALMIC) ×1 IMPLANT
SHIELD EYE LENSE ONLY DISP (GAUZE/BANDAGES/DRESSINGS) IMPLANT
STOCKINETTE IMPERVIOUS 9X36 MD (GAUZE/BANDAGES/DRESSINGS) ×2 IMPLANT
STOPCOCK 4 WAY LG BORE MALE ST (IV SETS) IMPLANT
WATER STERILE IRR 1000ML POUR (IV SOLUTION) ×1 IMPLANT

## 2022-05-29 NOTE — Brief Op Note (Signed)
Preoperative diagnosis 1.  Epiretinal membrane right eye  2.  Proliferative diabetic retinopathy right eye ,  quiescent Postop diagnoses 1 and 2 of the same  Anesthesia local MAC with retrobulbar are 2% Xylocaine  Surgeon Deloria Lair, MD  Procedure  Vitrectomy membrane peel of epiretinal membrane and internal limiting membrane off the macular [id number]  No complications  No blood loss  PACU good stable condition

## 2022-05-29 NOTE — H&P (Signed)
71 year old woman with advanced diabetic retinopathy, proliferative diabetic retinopathy and quiescent PDR.  Vision loss in the right eye on the basis of epiretinal membrane topographic distortion macular thickening.  Preoperative visual acuity in the right eye is 20/100's.  Patient has a history of end-stage renal disease on renal dialysis.  Today patient is alert oriented and stable vital signs.  Impression #1   epiretinal membrane right eye with severe impact on visual acuity  Quiescent proliferative diabetic retinopathy right eye  Plan #1  Repair via vitrectomy, membrane peel right eye under local MAC anesthesia the right eye.  Patient understands risk of anesthesia, recurrence death

## 2022-05-29 NOTE — Op Note (Signed)
NAME: Destiny Day, Destiny Day. MEDICAL RECORD NO: [id number] ACCOUNT NO: [id number] DATE OF BIRTH: 1950/09/12 FACILITY: [facility] LOCATION: [facility] PHYSICIAN: Clent Demark. Gulianna Hornsby, MD  Operative Report   DATE OF PROCEDURE: 05/29/2022  PREOPERATIVE DIAGNOSIS:    1.  Epiretinal membrane, right eye with severe vision loss. 2.  Proliferative diabetic retinopathy, right eye, quiescent.  POSTOPERATIVE DIAGNOSIS:   1.  Epiretinal membrane, right eye with severe vision loss. 2.  Proliferative diabetic retinopathy, right eye, quiescent.  PROCEDURE:  Posterior vitrectomy with membrane peel of dense epiretinal and subsequent internal limiting membrane, right eye.  SURGEON:  Clent Demark. Junice Fei, MD  ANESTHESIA:  Local retrobulbar with monitored anesthesia control.  INDICATIONS:  Patient with severe macular vision loss on the basis of epiretinal tissue, which has progressed over the macular region of the right eye.  Severe thickening has occurred.  INDICATION FOR PROCEDURE: Severe visual loss.  DESCRIPTION OF PROCEDURE IN DETAIL: The patient was taken to the operating after appropriate signed consent was obtained.  The patient understands the risk of anesthesia including reoccurrence, death, loss of the eye including but not limited to  hemorrhage, infection, scarring, need for another surgery, no change in vision, loss of vision or progressive disease despite intervention.     After appropriate signed consent was obtained the patient was taken to the operating room.  In the operating room appropriate monitoring was followed by mild sedation.  Surgical timeout was carried out with staff and surgeon identifying the right eye as the operative site.  Thereafter, under mild sedation, 2% Xylocaine injected retrobulbar 3 mL  followed by additional 3 mL lidocaine and fashioned a modified van lint with a Adine Heimann modification.  The right periocular region sterilely prepped and draped in the usual sterile fashion.  Lid  speculum applied.  25 gauge trocar placed in the inferotemporal quadrant verified visually in the vitreous cavity and infusion turned on.  Superior trocars were applied.  Previous vitrectomy repair retinal detachment be completed.  Thus, the vitreous washout was cleared,  it was carried out, so as to clear the media to a maximum efficiency.  Surgical capsulotomy was improved and enhanced.  At this time, TissueBlue was injected and that was almost immediately removed under the fluid phase.  No significant staining of the epiretinal tissues was present.  Thus, this was a severe epiretinal membrane.  Pinch technique over the macular region  was successfully elevated at the epiretinal tissue.  This exposed the underlying tractional proportions released immediately.  At this time, a reinjection of TissueBlue was carried out and a significant internal limiting membrane presence remained around  the perifoveal region.  This was also engaged subsequently, after removal of the blue material, the ILM was engaged with forceps and this was removed for approximatel 800 microns around the edge of the fovea.  Significant chorioretinal scarring  temporally from laser photocoagulation was present.  No further attempt was made to remove the ILM in this region temporally.  Excellent foveal topography improvement occurred during the case.  At this time clear vitrectomy was carried out.  All remaining visible floaters were removed.  Instruments were removed from the eye.  The superior trocars were removed.  The infusion was removed.  Intraocular pressure was maintained.  Wounds were secured.   Subconjunctival Decadron applied.  Sterile patch and Fox shield applied.  The patient tolerated the procedure well without complication and taken to PACU in good and stable condition.     Elián.Darby D: 05/29/2022 4:46:14 pm T: 05/29/2022  10:18:00 pm  JOB: [id number]/ [id number]

## 2022-05-29 NOTE — Anesthesia Preprocedure Evaluation (Signed)
Anesthesia Evaluation  Patient identified by MRN, date of birth, ID band Patient awake    Reviewed: Allergy & Precautions, NPO status , Patient's Chart, lab work & pertinent test results  Airway Mallampati: II       Dental  (+) Missing, Poor Dentition, Dental Advisory Given   Pulmonary asthma , sleep apnea , COPD,  COPD inhaler, former smoker    + decreased breath sounds      Cardiovascular hypertension, Pt. on medications and Pt. on home beta blockers + Peripheral Vascular Disease and +CHF   Rhythm:Regular Rate:Normal     Neuro/Psych  PSYCHIATRIC DISORDERS Anxiety        GI/Hepatic Neg liver ROS, hiatal hernia,GERD  Medicated,,  Endo/Other  diabetes, Type 2, Oral Hypoglycemic Agents    Renal/GU ESRF and DialysisRenal disease     Musculoskeletal  (+) Arthritis ,    Abdominal   Peds  Hematology   Anesthesia Other Findings   Reproductive/Obstetrics                             Anesthesia Physical Anesthesia Plan  ASA: 3  Anesthesia Plan: MAC   Post-op Pain Management:    Induction: Intravenous  PONV Risk Score and Plan: 2 and Propofol infusion and Ondansetron  Airway Management Planned: Natural Airway and Nasal Cannula  Additional Equipment: None  Intra-op Plan:   Post-operative Plan:   Informed Consent: I have reviewed the patients History and Physical, chart, labs and discussed the procedure including the risks, benefits and alternatives for the proposed anesthesia with the patient or authorized representative who has indicated his/her understanding and acceptance.       Plan Discussed with: CRNA  Anesthesia Plan Comments:        Anesthesia Quick Evaluation

## 2022-05-29 NOTE — Op Note (Signed)
Report [id number] dictated, 05/29/22

## 2022-05-29 NOTE — H&P (Deleted)
  The note originally documented on this encounter has been moved the the encounter in which it belongs.  

## 2022-05-29 NOTE — Transfer of Care (Signed)
Immediate Anesthesia Transfer of Care Note  Patient: Destiny Day  Procedure(s) Performed: PARS PLANA VITRECTOMY WITH 25 GAUGE (Right: Eye) MEMBRANE PEEL (Right: Eye)  Patient Location: PACU  Anesthesia Type:MAC  Level of Consciousness: awake, alert , and oriented  Airway & Oxygen Therapy: Patient Spontanous Breathing  Post-op Assessment: Report given to RN and Post -op Vital signs reviewed and stable  Post vital signs: Reviewed and stable  Last Vitals:  Vitals Value Taken Time  BP 128/90   Temp 98   Pulse 83 05/29/22 1643  Resp 14 05/29/22 1643  SpO2 91 % 05/29/22 1643  Vitals shown include unvalidated device data.  Last Pain:  Vitals:   05/29/22 1401  TempSrc:   PainSc: 0-No pain         Complications: No notable events documented.

## 2022-05-30 DIAGNOSIS — N186 End stage renal disease: Secondary | ICD-10-CM | POA: Diagnosis not present

## 2022-05-30 DIAGNOSIS — D509 Iron deficiency anemia, unspecified: Secondary | ICD-10-CM | POA: Diagnosis not present

## 2022-05-30 DIAGNOSIS — Z992 Dependence on renal dialysis: Secondary | ICD-10-CM | POA: Diagnosis not present

## 2022-05-30 DIAGNOSIS — D631 Anemia in chronic kidney disease: Secondary | ICD-10-CM | POA: Diagnosis not present

## 2022-05-30 DIAGNOSIS — N2581 Secondary hyperparathyroidism of renal origin: Secondary | ICD-10-CM | POA: Diagnosis not present

## 2022-05-30 NOTE — Anesthesia Postprocedure Evaluation (Signed)
Anesthesia Post Note  Patient: Destiny Day  Procedure(s) Performed: PARS PLANA VITRECTOMY WITH 25 GAUGE (Right: Eye) MEMBRANE PEEL (Right: Eye)     Patient location during evaluation: PACU Anesthesia Type: MAC Level of consciousness: awake and alert Pain management: pain level controlled Vital Signs Assessment: post-procedure vital signs reviewed and stable Respiratory status: spontaneous breathing, nonlabored ventilation, respiratory function stable and patient connected to nasal cannula oxygen Cardiovascular status: stable and blood pressure returned to baseline Postop Assessment: no apparent nausea or vomiting Anesthetic complications: no   No notable events documented.  Last Vitals:  Vitals:   05/29/22 1642 05/29/22 1657  BP: (!) 146/66 (!) 155/68  Pulse: 93 83  Resp: 20 18  Temp: (!) 36.2 C 36.6 C  SpO2: 94% 93%    Last Pain:  Vitals:   05/29/22 1642  TempSrc:   PainSc: 0-No pain                 Effie Berkshire

## 2022-05-31 ENCOUNTER — Encounter (HOSPITAL_COMMUNITY): Payer: Self-pay | Admitting: Ophthalmology

## 2022-06-01 DIAGNOSIS — E113512 Type 2 diabetes mellitus with proliferative diabetic retinopathy with macular edema, left eye: Secondary | ICD-10-CM | POA: Diagnosis not present

## 2022-06-01 DIAGNOSIS — D509 Iron deficiency anemia, unspecified: Secondary | ICD-10-CM | POA: Diagnosis not present

## 2022-06-01 DIAGNOSIS — Z992 Dependence on renal dialysis: Secondary | ICD-10-CM | POA: Diagnosis not present

## 2022-06-01 DIAGNOSIS — H35371 Puckering of macula, right eye: Secondary | ICD-10-CM | POA: Diagnosis not present

## 2022-06-01 DIAGNOSIS — N2581 Secondary hyperparathyroidism of renal origin: Secondary | ICD-10-CM | POA: Diagnosis not present

## 2022-06-01 DIAGNOSIS — N186 End stage renal disease: Secondary | ICD-10-CM | POA: Diagnosis not present

## 2022-06-01 DIAGNOSIS — D631 Anemia in chronic kidney disease: Secondary | ICD-10-CM | POA: Diagnosis not present

## 2022-06-02 DIAGNOSIS — N186 End stage renal disease: Secondary | ICD-10-CM | POA: Diagnosis not present

## 2022-06-02 DIAGNOSIS — E1140 Type 2 diabetes mellitus with diabetic neuropathy, unspecified: Secondary | ICD-10-CM | POA: Diagnosis not present

## 2022-06-02 DIAGNOSIS — E1122 Type 2 diabetes mellitus with diabetic chronic kidney disease: Secondary | ICD-10-CM | POA: Diagnosis not present

## 2022-06-02 DIAGNOSIS — Z992 Dependence on renal dialysis: Secondary | ICD-10-CM | POA: Diagnosis not present

## 2022-06-02 DIAGNOSIS — I132 Hypertensive heart and chronic kidney disease with heart failure and with stage 5 chronic kidney disease, or end stage renal disease: Secondary | ICD-10-CM | POA: Diagnosis not present

## 2022-06-02 DIAGNOSIS — I5030 Unspecified diastolic (congestive) heart failure: Secondary | ICD-10-CM | POA: Diagnosis not present

## 2022-06-03 DIAGNOSIS — N186 End stage renal disease: Secondary | ICD-10-CM | POA: Diagnosis not present

## 2022-06-03 DIAGNOSIS — D631 Anemia in chronic kidney disease: Secondary | ICD-10-CM | POA: Diagnosis not present

## 2022-06-03 DIAGNOSIS — D509 Iron deficiency anemia, unspecified: Secondary | ICD-10-CM | POA: Diagnosis not present

## 2022-06-03 DIAGNOSIS — Z992 Dependence on renal dialysis: Secondary | ICD-10-CM | POA: Diagnosis not present

## 2022-06-03 DIAGNOSIS — N2581 Secondary hyperparathyroidism of renal origin: Secondary | ICD-10-CM | POA: Diagnosis not present

## 2022-06-04 DIAGNOSIS — N186 End stage renal disease: Secondary | ICD-10-CM | POA: Diagnosis not present

## 2022-06-04 DIAGNOSIS — I132 Hypertensive heart and chronic kidney disease with heart failure and with stage 5 chronic kidney disease, or end stage renal disease: Secondary | ICD-10-CM | POA: Diagnosis not present

## 2022-06-04 DIAGNOSIS — Z992 Dependence on renal dialysis: Secondary | ICD-10-CM | POA: Diagnosis not present

## 2022-06-04 DIAGNOSIS — I5030 Unspecified diastolic (congestive) heart failure: Secondary | ICD-10-CM | POA: Diagnosis not present

## 2022-06-04 DIAGNOSIS — E1122 Type 2 diabetes mellitus with diabetic chronic kidney disease: Secondary | ICD-10-CM | POA: Diagnosis not present

## 2022-06-04 DIAGNOSIS — E1140 Type 2 diabetes mellitus with diabetic neuropathy, unspecified: Secondary | ICD-10-CM | POA: Diagnosis not present

## 2022-06-05 DIAGNOSIS — Z992 Dependence on renal dialysis: Secondary | ICD-10-CM | POA: Diagnosis not present

## 2022-06-05 DIAGNOSIS — D631 Anemia in chronic kidney disease: Secondary | ICD-10-CM | POA: Diagnosis not present

## 2022-06-05 DIAGNOSIS — N186 End stage renal disease: Secondary | ICD-10-CM | POA: Diagnosis not present

## 2022-06-05 DIAGNOSIS — N2581 Secondary hyperparathyroidism of renal origin: Secondary | ICD-10-CM | POA: Diagnosis not present

## 2022-06-05 DIAGNOSIS — D509 Iron deficiency anemia, unspecified: Secondary | ICD-10-CM | POA: Diagnosis not present

## 2022-06-05 LAB — CYTOLOGY - NON PAP

## 2022-06-08 DIAGNOSIS — D631 Anemia in chronic kidney disease: Secondary | ICD-10-CM | POA: Diagnosis not present

## 2022-06-08 DIAGNOSIS — Z992 Dependence on renal dialysis: Secondary | ICD-10-CM | POA: Diagnosis not present

## 2022-06-08 DIAGNOSIS — D509 Iron deficiency anemia, unspecified: Secondary | ICD-10-CM | POA: Diagnosis not present

## 2022-06-08 DIAGNOSIS — N186 End stage renal disease: Secondary | ICD-10-CM | POA: Diagnosis not present

## 2022-06-08 DIAGNOSIS — N2581 Secondary hyperparathyroidism of renal origin: Secondary | ICD-10-CM | POA: Diagnosis not present

## 2022-06-09 ENCOUNTER — Telehealth: Payer: Self-pay | Admitting: Allergy & Immunology

## 2022-06-09 DIAGNOSIS — Z992 Dependence on renal dialysis: Secondary | ICD-10-CM | POA: Diagnosis not present

## 2022-06-09 DIAGNOSIS — N186 End stage renal disease: Secondary | ICD-10-CM | POA: Diagnosis not present

## 2022-06-09 DIAGNOSIS — C73 Malignant neoplasm of thyroid gland: Secondary | ICD-10-CM

## 2022-06-09 DIAGNOSIS — I5030 Unspecified diastolic (congestive) heart failure: Secondary | ICD-10-CM | POA: Diagnosis not present

## 2022-06-09 DIAGNOSIS — I132 Hypertensive heart and chronic kidney disease with heart failure and with stage 5 chronic kidney disease, or end stage renal disease: Secondary | ICD-10-CM | POA: Diagnosis not present

## 2022-06-09 DIAGNOSIS — E1122 Type 2 diabetes mellitus with diabetic chronic kidney disease: Secondary | ICD-10-CM | POA: Diagnosis not present

## 2022-06-09 DIAGNOSIS — E1140 Type 2 diabetes mellitus with diabetic neuropathy, unspecified: Secondary | ICD-10-CM | POA: Diagnosis not present

## 2022-06-09 NOTE — Telephone Encounter (Signed)
Referral has been placed. Thanks [employer].

## 2022-06-09 NOTE — Telephone Encounter (Signed)
Refilled inhalers 

## 2022-06-10 DIAGNOSIS — Z992 Dependence on renal dialysis: Secondary | ICD-10-CM | POA: Diagnosis not present

## 2022-06-10 DIAGNOSIS — D509 Iron deficiency anemia, unspecified: Secondary | ICD-10-CM | POA: Diagnosis not present

## 2022-06-10 DIAGNOSIS — D631 Anemia in chronic kidney disease: Secondary | ICD-10-CM | POA: Diagnosis not present

## 2022-06-10 DIAGNOSIS — N186 End stage renal disease: Secondary | ICD-10-CM | POA: Diagnosis not present

## 2022-06-10 DIAGNOSIS — N2581 Secondary hyperparathyroidism of renal origin: Secondary | ICD-10-CM | POA: Diagnosis not present

## 2022-06-10 NOTE — Addendum Note (Signed)
Addended by: Valentina Shaggy on: 06/10/2022 09:37 AM   Modules accepted: Orders

## 2022-06-10 NOTE — Telephone Encounter (Signed)
Discussed with Oncology and they recommended Endocrine workup first. Placed Urgent Referral to Endocrine.   Salvatore Marvel, MD [facility] [place] [facility]

## 2022-06-11 ENCOUNTER — Ambulatory Visit (INDEPENDENT_AMBULATORY_CARE_PROVIDER_SITE_OTHER): Payer: Medicare Other

## 2022-06-11 DIAGNOSIS — Z992 Dependence on renal dialysis: Secondary | ICD-10-CM | POA: Diagnosis not present

## 2022-06-11 DIAGNOSIS — N186 End stage renal disease: Secondary | ICD-10-CM | POA: Diagnosis not present

## 2022-06-11 DIAGNOSIS — E1140 Type 2 diabetes mellitus with diabetic neuropathy, unspecified: Secondary | ICD-10-CM | POA: Diagnosis not present

## 2022-06-11 DIAGNOSIS — J309 Allergic rhinitis, unspecified: Secondary | ICD-10-CM

## 2022-06-11 DIAGNOSIS — E1122 Type 2 diabetes mellitus with diabetic chronic kidney disease: Secondary | ICD-10-CM | POA: Diagnosis not present

## 2022-06-11 DIAGNOSIS — I5030 Unspecified diastolic (congestive) heart failure: Secondary | ICD-10-CM | POA: Diagnosis not present

## 2022-06-11 DIAGNOSIS — I132 Hypertensive heart and chronic kidney disease with heart failure and with stage 5 chronic kidney disease, or end stage renal disease: Secondary | ICD-10-CM | POA: Diagnosis not present

## 2022-06-12 DIAGNOSIS — D509 Iron deficiency anemia, unspecified: Secondary | ICD-10-CM | POA: Diagnosis not present

## 2022-06-12 DIAGNOSIS — D631 Anemia in chronic kidney disease: Secondary | ICD-10-CM | POA: Diagnosis not present

## 2022-06-12 DIAGNOSIS — N2581 Secondary hyperparathyroidism of renal origin: Secondary | ICD-10-CM | POA: Diagnosis not present

## 2022-06-12 DIAGNOSIS — Z992 Dependence on renal dialysis: Secondary | ICD-10-CM | POA: Diagnosis not present

## 2022-06-12 DIAGNOSIS — N186 End stage renal disease: Secondary | ICD-10-CM | POA: Diagnosis not present

## 2022-06-14 DIAGNOSIS — N186 End stage renal disease: Secondary | ICD-10-CM | POA: Diagnosis not present

## 2022-06-14 DIAGNOSIS — Z992 Dependence on renal dialysis: Secondary | ICD-10-CM | POA: Diagnosis not present

## 2022-06-14 DIAGNOSIS — D631 Anemia in chronic kidney disease: Secondary | ICD-10-CM | POA: Diagnosis not present

## 2022-06-14 DIAGNOSIS — D509 Iron deficiency anemia, unspecified: Secondary | ICD-10-CM | POA: Diagnosis not present

## 2022-06-14 DIAGNOSIS — N2581 Secondary hyperparathyroidism of renal origin: Secondary | ICD-10-CM | POA: Diagnosis not present

## 2022-06-15 DIAGNOSIS — H35352 Cystoid macular degeneration, left eye: Secondary | ICD-10-CM | POA: Diagnosis not present

## 2022-06-15 DIAGNOSIS — E113512 Type 2 diabetes mellitus with proliferative diabetic retinopathy with macular edema, left eye: Secondary | ICD-10-CM | POA: Diagnosis not present

## 2022-06-15 DIAGNOSIS — H35371 Puckering of macula, right eye: Secondary | ICD-10-CM | POA: Diagnosis not present

## 2022-06-16 DIAGNOSIS — Z992 Dependence on renal dialysis: Secondary | ICD-10-CM | POA: Diagnosis not present

## 2022-06-16 DIAGNOSIS — N2581 Secondary hyperparathyroidism of renal origin: Secondary | ICD-10-CM | POA: Diagnosis not present

## 2022-06-16 DIAGNOSIS — D509 Iron deficiency anemia, unspecified: Secondary | ICD-10-CM | POA: Diagnosis not present

## 2022-06-16 DIAGNOSIS — N186 End stage renal disease: Secondary | ICD-10-CM | POA: Diagnosis not present

## 2022-06-16 DIAGNOSIS — D631 Anemia in chronic kidney disease: Secondary | ICD-10-CM | POA: Diagnosis not present

## 2022-06-16 NOTE — Telephone Encounter (Signed)
Referral was denied with [facility] Endocrinology due to no urgent appointments available. I have changed the referral to [facility] Endocrinology. [place] T. with Dr Dorris Fetch is going to forward the referral over to Dr Dorris Fetch for review.   If denied, I can try [facility] Endocrinology.

## 2022-06-17 ENCOUNTER — Telehealth: Payer: Self-pay | Admitting: Allergy & Immunology

## 2022-06-17 DIAGNOSIS — I132 Hypertensive heart and chronic kidney disease with heart failure and with stage 5 chronic kidney disease, or end stage renal disease: Secondary | ICD-10-CM | POA: Diagnosis not present

## 2022-06-17 DIAGNOSIS — I5030 Unspecified diastolic (congestive) heart failure: Secondary | ICD-10-CM | POA: Diagnosis not present

## 2022-06-17 DIAGNOSIS — N186 End stage renal disease: Secondary | ICD-10-CM | POA: Diagnosis not present

## 2022-06-17 DIAGNOSIS — E1122 Type 2 diabetes mellitus with diabetic chronic kidney disease: Secondary | ICD-10-CM | POA: Diagnosis not present

## 2022-06-17 DIAGNOSIS — E1140 Type 2 diabetes mellitus with diabetic neuropathy, unspecified: Secondary | ICD-10-CM | POA: Diagnosis not present

## 2022-06-17 DIAGNOSIS — Z992 Dependence on renal dialysis: Secondary | ICD-10-CM | POA: Diagnosis not present

## 2022-06-17 NOTE — Telephone Encounter (Signed)
Patient called and stated that the provider Dr Ernst Bowler referred her to told her that she needs a procedure. Patient is requesting that Dr Ernst Bowler call her back so that she can discuss this with her. Patients call back number is [phone]

## 2022-06-17 NOTE — Telephone Encounter (Signed)
Patient is scheduled to see Dr Dorris Fetch on 06/25/2022 in [facility].

## 2022-06-17 NOTE — Telephone Encounter (Signed)
Please advise to calling pt 

## 2022-06-17 NOTE — Telephone Encounter (Signed)
I called and spoke with the patient as Dr Ernst Bowler is out of the office. Patient was a little mixed up on why we were changing from the [facility] Endocrinology location to [facility]. I explained everything to her and she understands. She wants to keep the appointment but all of her transportation has to work that day. This appointment was made urgently as a work in. Dr Dorris Fetch is booked out till January and the [place] location couldn't get her in until March 2024.    I spoke with Johnette and she is going to look into finding her some transportation. Patient has been informed and is very grateful for the help. I told her we are closed for the holiday and it will be Monday before she hears back from [place].

## 2022-06-18 DIAGNOSIS — Z7984 Long term (current) use of oral hypoglycemic drugs: Secondary | ICD-10-CM | POA: Diagnosis not present

## 2022-06-18 DIAGNOSIS — E1122 Type 2 diabetes mellitus with diabetic chronic kidney disease: Secondary | ICD-10-CM | POA: Diagnosis not present

## 2022-06-18 DIAGNOSIS — F419 Anxiety disorder, unspecified: Secondary | ICD-10-CM | POA: Diagnosis not present

## 2022-06-18 DIAGNOSIS — Z89421 Acquired absence of other right toe(s): Secondary | ICD-10-CM | POA: Diagnosis not present

## 2022-06-18 DIAGNOSIS — G4733 Obstructive sleep apnea (adult) (pediatric): Secondary | ICD-10-CM | POA: Diagnosis not present

## 2022-06-18 DIAGNOSIS — E785 Hyperlipidemia, unspecified: Secondary | ICD-10-CM | POA: Diagnosis not present

## 2022-06-18 DIAGNOSIS — M1990 Unspecified osteoarthritis, unspecified site: Secondary | ICD-10-CM | POA: Diagnosis not present

## 2022-06-18 DIAGNOSIS — I5030 Unspecified diastolic (congestive) heart failure: Secondary | ICD-10-CM | POA: Diagnosis not present

## 2022-06-18 DIAGNOSIS — Z992 Dependence on renal dialysis: Secondary | ICD-10-CM | POA: Diagnosis not present

## 2022-06-18 DIAGNOSIS — Z7982 Long term (current) use of aspirin: Secondary | ICD-10-CM | POA: Diagnosis not present

## 2022-06-18 DIAGNOSIS — Z87891 Personal history of nicotine dependence: Secondary | ICD-10-CM | POA: Diagnosis not present

## 2022-06-18 DIAGNOSIS — E1140 Type 2 diabetes mellitus with diabetic neuropathy, unspecified: Secondary | ICD-10-CM | POA: Diagnosis not present

## 2022-06-18 DIAGNOSIS — N186 End stage renal disease: Secondary | ICD-10-CM | POA: Diagnosis not present

## 2022-06-18 DIAGNOSIS — I132 Hypertensive heart and chronic kidney disease with heart failure and with stage 5 chronic kidney disease, or end stage renal disease: Secondary | ICD-10-CM | POA: Diagnosis not present

## 2022-06-18 DIAGNOSIS — E1151 Type 2 diabetes mellitus with diabetic peripheral angiopathy without gangrene: Secondary | ICD-10-CM | POA: Diagnosis not present

## 2022-06-19 DIAGNOSIS — Z992 Dependence on renal dialysis: Secondary | ICD-10-CM | POA: Diagnosis not present

## 2022-06-19 DIAGNOSIS — N2581 Secondary hyperparathyroidism of renal origin: Secondary | ICD-10-CM | POA: Diagnosis not present

## 2022-06-19 DIAGNOSIS — D631 Anemia in chronic kidney disease: Secondary | ICD-10-CM | POA: Diagnosis not present

## 2022-06-19 DIAGNOSIS — D509 Iron deficiency anemia, unspecified: Secondary | ICD-10-CM | POA: Diagnosis not present

## 2022-06-19 DIAGNOSIS — N186 End stage renal disease: Secondary | ICD-10-CM | POA: Diagnosis not present

## 2022-06-22 DIAGNOSIS — N186 End stage renal disease: Secondary | ICD-10-CM | POA: Diagnosis not present

## 2022-06-22 DIAGNOSIS — D509 Iron deficiency anemia, unspecified: Secondary | ICD-10-CM | POA: Diagnosis not present

## 2022-06-22 DIAGNOSIS — D631 Anemia in chronic kidney disease: Secondary | ICD-10-CM | POA: Diagnosis not present

## 2022-06-22 DIAGNOSIS — N2581 Secondary hyperparathyroidism of renal origin: Secondary | ICD-10-CM | POA: Diagnosis not present

## 2022-06-22 DIAGNOSIS — Z992 Dependence on renal dialysis: Secondary | ICD-10-CM | POA: Diagnosis not present

## 2022-06-23 ENCOUNTER — Encounter (HOSPITAL_COMMUNITY): Payer: Self-pay

## 2022-06-23 DIAGNOSIS — I5030 Unspecified diastolic (congestive) heart failure: Secondary | ICD-10-CM | POA: Diagnosis not present

## 2022-06-23 DIAGNOSIS — E1122 Type 2 diabetes mellitus with diabetic chronic kidney disease: Secondary | ICD-10-CM | POA: Diagnosis not present

## 2022-06-23 DIAGNOSIS — E1140 Type 2 diabetes mellitus with diabetic neuropathy, unspecified: Secondary | ICD-10-CM | POA: Diagnosis not present

## 2022-06-23 DIAGNOSIS — I132 Hypertensive heart and chronic kidney disease with heart failure and with stage 5 chronic kidney disease, or end stage renal disease: Secondary | ICD-10-CM | POA: Diagnosis not present

## 2022-06-23 DIAGNOSIS — N186 End stage renal disease: Secondary | ICD-10-CM | POA: Diagnosis not present

## 2022-06-23 DIAGNOSIS — Z992 Dependence on renal dialysis: Secondary | ICD-10-CM | POA: Diagnosis not present

## 2022-06-23 NOTE — Telephone Encounter (Signed)
Referral has been faxed to the following:  Earnstine Regal, MD [place] [address] [place], [place] [address] Phone: [phone] Fax: [phone]  Will follow back up this week to see if it has been received.

## 2022-06-23 NOTE — Telephone Encounter (Signed)
We discussed the situation some more with both Endocrinology and Johnette. They recommended that due to the transportation issues, we are going to get her in to see General Surgery first to see whether surgery is required. Per Endocrinology, that is typically a first step with thyroid cancer anyway. Then we can get her in to see Endocrinology in January 2024 in one of their [place] locations instead.   I discussed with the patient and she is on board with this new plan.   Salvatore Marvel, MD Allergy and [place] [facility] [place]

## 2022-06-24 DIAGNOSIS — Z992 Dependence on renal dialysis: Secondary | ICD-10-CM | POA: Diagnosis not present

## 2022-06-24 DIAGNOSIS — N186 End stage renal disease: Secondary | ICD-10-CM | POA: Diagnosis not present

## 2022-06-24 DIAGNOSIS — D509 Iron deficiency anemia, unspecified: Secondary | ICD-10-CM | POA: Diagnosis not present

## 2022-06-24 DIAGNOSIS — N2581 Secondary hyperparathyroidism of renal origin: Secondary | ICD-10-CM | POA: Diagnosis not present

## 2022-06-24 DIAGNOSIS — D631 Anemia in chronic kidney disease: Secondary | ICD-10-CM | POA: Diagnosis not present

## 2022-06-24 NOTE — Telephone Encounter (Signed)
Thanks, Dee!!   Shamica Moree, MD [facility]   

## 2022-06-25 ENCOUNTER — Ambulatory Visit: Payer: Medicare Other | Admitting: "Endocrinology

## 2022-06-25 NOTE — Telephone Encounter (Signed)
Patient is scheduled to see Dr Harlow Asa on 07/07/2022. I am soooo glad. They spoke with the patient and she is going to work on getting transportation to their office.

## 2022-06-26 DIAGNOSIS — Z992 Dependence on renal dialysis: Secondary | ICD-10-CM | POA: Diagnosis not present

## 2022-06-26 DIAGNOSIS — D509 Iron deficiency anemia, unspecified: Secondary | ICD-10-CM | POA: Diagnosis not present

## 2022-06-26 DIAGNOSIS — N186 End stage renal disease: Secondary | ICD-10-CM | POA: Diagnosis not present

## 2022-06-26 DIAGNOSIS — D631 Anemia in chronic kidney disease: Secondary | ICD-10-CM | POA: Diagnosis not present

## 2022-06-26 DIAGNOSIS — I159 Secondary hypertension, unspecified: Secondary | ICD-10-CM | POA: Diagnosis not present

## 2022-06-26 DIAGNOSIS — N2581 Secondary hyperparathyroidism of renal origin: Secondary | ICD-10-CM | POA: Diagnosis not present

## 2022-06-29 DIAGNOSIS — N2581 Secondary hyperparathyroidism of renal origin: Secondary | ICD-10-CM | POA: Diagnosis not present

## 2022-06-29 DIAGNOSIS — Z992 Dependence on renal dialysis: Secondary | ICD-10-CM | POA: Diagnosis not present

## 2022-06-29 DIAGNOSIS — D631 Anemia in chronic kidney disease: Secondary | ICD-10-CM | POA: Diagnosis not present

## 2022-06-29 DIAGNOSIS — D509 Iron deficiency anemia, unspecified: Secondary | ICD-10-CM | POA: Diagnosis not present

## 2022-06-29 DIAGNOSIS — N186 End stage renal disease: Secondary | ICD-10-CM | POA: Diagnosis not present

## 2022-06-30 DIAGNOSIS — N186 End stage renal disease: Secondary | ICD-10-CM | POA: Diagnosis not present

## 2022-06-30 DIAGNOSIS — I5030 Unspecified diastolic (congestive) heart failure: Secondary | ICD-10-CM | POA: Diagnosis not present

## 2022-06-30 DIAGNOSIS — E1122 Type 2 diabetes mellitus with diabetic chronic kidney disease: Secondary | ICD-10-CM | POA: Diagnosis not present

## 2022-06-30 DIAGNOSIS — E1140 Type 2 diabetes mellitus with diabetic neuropathy, unspecified: Secondary | ICD-10-CM | POA: Diagnosis not present

## 2022-06-30 DIAGNOSIS — Z992 Dependence on renal dialysis: Secondary | ICD-10-CM | POA: Diagnosis not present

## 2022-06-30 DIAGNOSIS — I132 Hypertensive heart and chronic kidney disease with heart failure and with stage 5 chronic kidney disease, or end stage renal disease: Secondary | ICD-10-CM | POA: Diagnosis not present

## 2022-07-01 DIAGNOSIS — N186 End stage renal disease: Secondary | ICD-10-CM | POA: Diagnosis not present

## 2022-07-01 DIAGNOSIS — N2581 Secondary hyperparathyroidism of renal origin: Secondary | ICD-10-CM | POA: Diagnosis not present

## 2022-07-01 DIAGNOSIS — D509 Iron deficiency anemia, unspecified: Secondary | ICD-10-CM | POA: Diagnosis not present

## 2022-07-01 DIAGNOSIS — D631 Anemia in chronic kidney disease: Secondary | ICD-10-CM | POA: Diagnosis not present

## 2022-07-01 DIAGNOSIS — Z992 Dependence on renal dialysis: Secondary | ICD-10-CM | POA: Diagnosis not present

## 2022-07-03 DIAGNOSIS — Z992 Dependence on renal dialysis: Secondary | ICD-10-CM | POA: Diagnosis not present

## 2022-07-03 DIAGNOSIS — D509 Iron deficiency anemia, unspecified: Secondary | ICD-10-CM | POA: Diagnosis not present

## 2022-07-03 DIAGNOSIS — N186 End stage renal disease: Secondary | ICD-10-CM | POA: Diagnosis not present

## 2022-07-03 DIAGNOSIS — D631 Anemia in chronic kidney disease: Secondary | ICD-10-CM | POA: Diagnosis not present

## 2022-07-03 DIAGNOSIS — N2581 Secondary hyperparathyroidism of renal origin: Secondary | ICD-10-CM | POA: Diagnosis not present

## 2022-07-06 DIAGNOSIS — N2581 Secondary hyperparathyroidism of renal origin: Secondary | ICD-10-CM | POA: Diagnosis not present

## 2022-07-06 DIAGNOSIS — D509 Iron deficiency anemia, unspecified: Secondary | ICD-10-CM | POA: Diagnosis not present

## 2022-07-06 DIAGNOSIS — N186 End stage renal disease: Secondary | ICD-10-CM | POA: Diagnosis not present

## 2022-07-06 DIAGNOSIS — D631 Anemia in chronic kidney disease: Secondary | ICD-10-CM | POA: Diagnosis not present

## 2022-07-06 DIAGNOSIS — Z992 Dependence on renal dialysis: Secondary | ICD-10-CM | POA: Diagnosis not present

## 2022-07-07 DIAGNOSIS — E042 Nontoxic multinodular goiter: Secondary | ICD-10-CM | POA: Diagnosis not present

## 2022-07-08 DIAGNOSIS — D509 Iron deficiency anemia, unspecified: Secondary | ICD-10-CM | POA: Diagnosis not present

## 2022-07-08 DIAGNOSIS — Z992 Dependence on renal dialysis: Secondary | ICD-10-CM | POA: Diagnosis not present

## 2022-07-08 DIAGNOSIS — N2581 Secondary hyperparathyroidism of renal origin: Secondary | ICD-10-CM | POA: Diagnosis not present

## 2022-07-08 DIAGNOSIS — D631 Anemia in chronic kidney disease: Secondary | ICD-10-CM | POA: Diagnosis not present

## 2022-07-08 DIAGNOSIS — N186 End stage renal disease: Secondary | ICD-10-CM | POA: Diagnosis not present

## 2022-07-09 DIAGNOSIS — E1122 Type 2 diabetes mellitus with diabetic chronic kidney disease: Secondary | ICD-10-CM | POA: Diagnosis not present

## 2022-07-09 DIAGNOSIS — I132 Hypertensive heart and chronic kidney disease with heart failure and with stage 5 chronic kidney disease, or end stage renal disease: Secondary | ICD-10-CM | POA: Diagnosis not present

## 2022-07-09 DIAGNOSIS — Z992 Dependence on renal dialysis: Secondary | ICD-10-CM | POA: Diagnosis not present

## 2022-07-09 DIAGNOSIS — E1140 Type 2 diabetes mellitus with diabetic neuropathy, unspecified: Secondary | ICD-10-CM | POA: Diagnosis not present

## 2022-07-09 DIAGNOSIS — I5030 Unspecified diastolic (congestive) heart failure: Secondary | ICD-10-CM | POA: Diagnosis not present

## 2022-07-09 DIAGNOSIS — N186 End stage renal disease: Secondary | ICD-10-CM | POA: Diagnosis not present

## 2022-07-10 DIAGNOSIS — N186 End stage renal disease: Secondary | ICD-10-CM | POA: Diagnosis not present

## 2022-07-10 DIAGNOSIS — D631 Anemia in chronic kidney disease: Secondary | ICD-10-CM | POA: Diagnosis not present

## 2022-07-10 DIAGNOSIS — N2581 Secondary hyperparathyroidism of renal origin: Secondary | ICD-10-CM | POA: Diagnosis not present

## 2022-07-10 DIAGNOSIS — D509 Iron deficiency anemia, unspecified: Secondary | ICD-10-CM | POA: Diagnosis not present

## 2022-07-10 DIAGNOSIS — Z992 Dependence on renal dialysis: Secondary | ICD-10-CM | POA: Diagnosis not present

## 2022-07-13 DIAGNOSIS — Z992 Dependence on renal dialysis: Secondary | ICD-10-CM | POA: Diagnosis not present

## 2022-07-13 DIAGNOSIS — D631 Anemia in chronic kidney disease: Secondary | ICD-10-CM | POA: Diagnosis not present

## 2022-07-13 DIAGNOSIS — N2581 Secondary hyperparathyroidism of renal origin: Secondary | ICD-10-CM | POA: Diagnosis not present

## 2022-07-13 DIAGNOSIS — D509 Iron deficiency anemia, unspecified: Secondary | ICD-10-CM | POA: Diagnosis not present

## 2022-07-13 DIAGNOSIS — N186 End stage renal disease: Secondary | ICD-10-CM | POA: Diagnosis not present

## 2022-07-14 ENCOUNTER — Ambulatory Visit (INDEPENDENT_AMBULATORY_CARE_PROVIDER_SITE_OTHER): Payer: Medicare Other

## 2022-07-14 DIAGNOSIS — H35371 Puckering of macula, right eye: Secondary | ICD-10-CM | POA: Diagnosis not present

## 2022-07-14 DIAGNOSIS — H401121 Primary open-angle glaucoma, left eye, mild stage: Secondary | ICD-10-CM | POA: Diagnosis not present

## 2022-07-14 DIAGNOSIS — E113512 Type 2 diabetes mellitus with proliferative diabetic retinopathy with macular edema, left eye: Secondary | ICD-10-CM | POA: Diagnosis not present

## 2022-07-14 DIAGNOSIS — J309 Allergic rhinitis, unspecified: Secondary | ICD-10-CM

## 2022-07-14 DIAGNOSIS — H35352 Cystoid macular degeneration, left eye: Secondary | ICD-10-CM | POA: Diagnosis not present

## 2022-07-15 DIAGNOSIS — D509 Iron deficiency anemia, unspecified: Secondary | ICD-10-CM | POA: Diagnosis not present

## 2022-07-15 DIAGNOSIS — D631 Anemia in chronic kidney disease: Secondary | ICD-10-CM | POA: Diagnosis not present

## 2022-07-15 DIAGNOSIS — Z992 Dependence on renal dialysis: Secondary | ICD-10-CM | POA: Diagnosis not present

## 2022-07-15 DIAGNOSIS — N186 End stage renal disease: Secondary | ICD-10-CM | POA: Diagnosis not present

## 2022-07-15 DIAGNOSIS — N2581 Secondary hyperparathyroidism of renal origin: Secondary | ICD-10-CM | POA: Diagnosis not present

## 2022-07-17 DIAGNOSIS — Z992 Dependence on renal dialysis: Secondary | ICD-10-CM | POA: Diagnosis not present

## 2022-07-17 DIAGNOSIS — D509 Iron deficiency anemia, unspecified: Secondary | ICD-10-CM | POA: Diagnosis not present

## 2022-07-17 DIAGNOSIS — D631 Anemia in chronic kidney disease: Secondary | ICD-10-CM | POA: Diagnosis not present

## 2022-07-17 DIAGNOSIS — N186 End stage renal disease: Secondary | ICD-10-CM | POA: Diagnosis not present

## 2022-07-17 DIAGNOSIS — N2581 Secondary hyperparathyroidism of renal origin: Secondary | ICD-10-CM | POA: Diagnosis not present

## 2022-07-19 DIAGNOSIS — N186 End stage renal disease: Secondary | ICD-10-CM | POA: Diagnosis not present

## 2022-07-19 DIAGNOSIS — D631 Anemia in chronic kidney disease: Secondary | ICD-10-CM | POA: Diagnosis not present

## 2022-07-19 DIAGNOSIS — D509 Iron deficiency anemia, unspecified: Secondary | ICD-10-CM | POA: Diagnosis not present

## 2022-07-19 DIAGNOSIS — Z992 Dependence on renal dialysis: Secondary | ICD-10-CM | POA: Diagnosis not present

## 2022-07-19 DIAGNOSIS — N2581 Secondary hyperparathyroidism of renal origin: Secondary | ICD-10-CM | POA: Diagnosis not present

## 2022-07-21 ENCOUNTER — Ambulatory Visit (HOSPITAL_COMMUNITY)
Admission: EM | Admit: 2022-07-21 | Discharge: 2022-07-21 | Disposition: A | Discharge status: Home / Self Care | Payer: Medicare Other | Attending: Family Medicine | Admitting: Family Medicine

## 2022-07-21 ENCOUNTER — Encounter (HOSPITAL_COMMUNITY): Payer: Self-pay | Admitting: *Deleted

## 2022-07-21 DIAGNOSIS — I10 Essential (primary) hypertension: Secondary | ICD-10-CM

## 2022-07-21 DIAGNOSIS — J4521 Mild intermittent asthma with (acute) exacerbation: Secondary | ICD-10-CM

## 2022-07-21 MED ORDER — PROMETHAZINE-DM 6.25-15 MG/5ML PO SYRP: 5.0000 mL | ORAL_SOLUTION | Freq: Four times a day (QID) | ORAL | 0 refills | Status: AC | PRN

## 2022-07-21 MED ORDER — ALBUTEROL SULFATE HFA 108 (90 BASE) MCG/ACT IN AERS
INHALATION_SPRAY | RESPIRATORY_TRACT | Status: AC
  Filled 2022-07-21: qty 6.7

## 2022-07-21 MED ORDER — ALBUTEROL SULFATE HFA 108 (90 BASE) MCG/ACT IN AERS: 2.0000 | INHALATION_SPRAY | RESPIRATORY_TRACT | 0 refills | Status: AC | PRN

## 2022-07-21 MED ORDER — ALBUTEROL SULFATE (2.5 MG/3ML) 0.083% IN NEBU
2.5000 mg | INHALATION_SOLUTION | Freq: Once | RESPIRATORY_TRACT | Status: AC
  Administered 2022-07-21: 2.5 mg via RESPIRATORY_TRACT

## 2022-07-21 NOTE — ED Triage Notes (Signed)
Pt states has has cough and congestion since 07/19/2022, she feels like she is wheezing. She is taking no meds. She is out of her albuterol and symbicort x 3 months she states she can't afford it.

## 2022-07-21 NOTE — ED Provider Notes (Signed)
[place]    CSN: [id number] Arrival date & time: 07/21/22  1012      History   Chief Complaint Chief Complaint  Patient presents with   Cough   Nasal Congestion    HPI Destiny Day is a 71 y.o. female.   Patient is here for a "rattling"in her chest with a cough . She has had a cough for a long time, but the rattling/wheezing started 2 days ago.  Also with runny nose, drainage, which is also chronic.  No fevers/chills.  She is out of her albuterol and symbicort as they are expensive and cannot afford it.  She states she is unable to take prednisone at all as it causes swelling in her LE.   She has not had her bp medication this morning.  She does have some at home and just did not take it this morning.  No headaches or dizziness.        Past Medical History:  Diagnosis Date   Abdominal bruit    Anxiety    Arthritis    Osteoarthritis   Asthma    Cervical disc disease    "pinced nerve"   CHF (congestive heart failure) (HCC)    COPD (chronic obstructive pulmonary disease) (HCC)    Diabetes mellitus    Type II   Diverticulitis    ESRD (end stage renal disease) ([place])    dialysis - M/W/F- [place]   GERD (gastroesophageal reflux disease)    from medications   GI bleed 03/31/2013   Head injury 07/2017   History of hiatal hernia    Hyperlipidemia    Hypertension    Neuropathy    left leg   Nuclear sclerotic cataract of right eye 04/23/2020   Osteoporosis    Peripheral vascular disease ([place])    Pneumonia    "very young" and a few years ago   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)    Vitrectomy membrane peel endolaser 05-07-2021   Seasonal allergies    Shortness of breath dyspnea    WIth exertion, when fluid builds   Sleep apnea    can't afford cpap    Vitreomacular traction syndrome, 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   05-07-2021  Vitrectomy, membrane peel, PRP, resection posterior  segment of NVD, remnants of peripheral NVE remain in place due to atrophic retina, no vitreous substitute    Patient Active Problem List   Diagnosis Date Noted   Primary open angle glaucoma of left eye, mild stage 10/23/2021   ESRD (end stage renal disease) ([place]) 08/04/2021   Fluid overload 08/03/2021   Prolonged QT interval 08/03/2021   Acute on chronic heart failure ([place]) 01/05/2021   Macular pucker, right eye 10/29/2020   Nausea with vomiting, unspecified 09/10/2020   Pseudophakia of left eye 07/02/2020   Diabetic macular edema of right eye with proliferative retinopathy associated with type 2 diabetes mellitus ([place]) 07/02/2020   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 type 2 diabetes mellitus ([place]) 04/23/2020   Allergy, unspecified, initial encounter 04/04/2020   Anaphylactic shock, unspecified, initial encounter 04/04/2020   Secondary hypertension, unspecified 12/13/2019   Other disorders of phosphorus metabolism 08/24/2019   Paroxysmal atrial fibrillation ([place]) 06/13/2019   Hypocalcemia 04/17/2019   Old bucket handle tear of lateral meniscus of right knee    Old peripheral tear of medial meniscus of right knee    Staphylococcal arthritis, unspecified joint ([place]) 05/11/2018  Septic arthritis of knee, right ([place]) 05/06/2018   Degenerative tear of posterior horn of medial meniscus, right    Old complex tear of lateral meniscus of right knee    Loose body in knee, right    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[place]) 02/03/2018   End-stage renal disease on hemodialysis (HCC)    Respiratory failure ([place]) 01/21/2018   Achilles tendon contracture, right 11/09/2017   Labile blood glucose    Anemia of infection and chronic disease    Black stool    Right sided weakness    Subdural hemorrhage following injury ([place]) 08/30/2017   Diabetic peripheral neuropathy ([place])    Chronic obstructive pulmonary disease ([place])    Subdural  hematoma ([place]) 08/24/2017   Traumatic subdural hematoma ([place]) 08/23/2017   Fall    SDH (subdural hematoma) (HCC)    Acute respiratory failure with hypoxia ([place])    Diabetes mellitus type 2 in nonobese (HCC)    Leukocytosis    Labile blood pressure    Generalized anxiety disorder    Debility 08/16/2017   Amputated great toe of right foot ([place])    Amputated toe of left foot ([employer])    Post-operative pain    ESRD on dialysis [facility])    Anxiety state    Acute blood loss anemia    Diarrhea, unspecified 01/16/2017   Dependence on renal dialysis ([place]) 01/06/2017   Idiopathic chronic venous hypertension of both lower extremities with inflammation 10/13/2016   S/P transmetatarsal amputation of foot, left ([place]) 09/21/2016   Onychomycosis 08/15/2016   Unspecified protein-calorie malnutrition ([place]) 07/29/2016   Other mechanical complication of femoral arterial graft (bypass), initial encounter ([place]) 07/14/2016   Other pneumonia, unspecified organism 07/11/2016   Age-related osteoporosis without current pathological fracture 07/11/2016   Cervical disc disorder, unspecified, unspecified cervical region 07/11/2016   Diverticulitis of intestine, part unspecified, without perforation or abscess without bleeding 07/11/2016   Unspecified abdominal hernia without obstruction or gangrene 07/11/2016   Gastro-esophageal reflux disease with esophagitis 07/11/2016   Primary generalized (osteo)arthritis 07/11/2016   Peripheral vascular disease ([place]) 07/08/2016   Cellulitis of left toe 06/29/2016   Atherosclerosis of native artery of left lower extremity with gangrene ([place]) 22/08/5425   Complication of vascular dialysis catheter 02/29/2016   Encounter for screening for other viral diseases 01/10/2016   Encounter for removal of sutures 10/29/2015   Other fluid overload 10/23/2015   Anemia in chronic kidney disease 10/16/2015   Essential (primary) hypertension 10/16/2015   Acute pulmonary edema ([place])  10/16/2015   Coagulation defect, unspecified ([place]) 10/16/2015   Acute respiratory failure ([place]) 10/16/2015   Hyperlipidemia, unspecified 10/16/2015   Iron deficiency anemia, unspecified 10/16/2015   Pain, unspecified 10/16/2015   Pruritus, unspecified 10/16/2015   Secondary hyperparathyroidism of renal origin ([place]) 10/16/2015   Shortness of breath 10/16/2015   GERD (gastroesophageal reflux disease) 10/07/2015   ARF (acute renal failure) ([place])    Hypothermia 06/12/2015   End stage renal disease ([place]) 06/12/2015   Unspecified diastolic (congestive) heart failure ([place]) 07/30/2014   CKD (chronic kidney disease) stage 4, GFR 15-29 ml/min (HCC) 06/27/2014   Hypertensive heart disease 05/25/2013   CKD (chronic kidney disease) stage 3, GFR 30-59 ml/min (HCC) 05/25/2013   Hypertensive urgency 05/23/2013   Pulmonary edema 05/23/2013   Type 2 diabetes mellitus with diabetic chronic kidney disease ([place]) 05/23/2013   Type 2 diabetes mellitus with hyperosmolar nonketotic hyperglycemia ([place]) 04/13/2013   Acute on chronic diastolic heart failure ([place]) 04/13/2013  Gastrointestinal hemorrhage, unspecified 03/31/2013   Hypokalemia 08/24/2012   Hyperlipidemia associated with type 2 diabetes mellitus ([place]) 12/27/2007   Allergic rhinitis due to pollen 12/27/2007   Unspecified asthma, uncomplicated 62/22/9798    Past Surgical History:  Procedure Laterality Date   A/V SHUNTOGRAM N/A 09/22/2016   Procedure: A/V Shuntogram - left arm;  Surgeon: Serafina Mitchell, MD;  Location: [place] [facility] LAB;  Service: Cardiovascular;  Laterality: N/A;   A/V SHUNTOGRAM N/A 03/22/2018   Procedure: A/V SHUNTOGRAM - left arm;  Surgeon: Serafina Mitchell, MD;  Location: [place] [facility];  Service: Cardiovascular;  Laterality: N/A;   A/V SHUNTOGRAM Left 11/17/2018   Procedure: A/V SHUNTOGRAM;  Surgeon: Angelia Mould, MD;  Location: [place] [facility];  Service: Cardiovascular;  Laterality: Left;   ABDOMINAL  HYSTERECTOMY  1993`   AMPUTATION Left 09/01/2016   Procedure: LEFT FOOT TRANSMETATARSAL AMPUTATION;  Surgeon: Newt Minion, MD;  Location: [place];  Service: Orthopedics;  Laterality: Left;   AMPUTATION Right 08/11/2017   Procedure: RIGHT GREAT TOE AMPUTATION DIGIT;  Surgeon: Rosetta Posner, MD;  Location: [place];  Service: Vascular;  Laterality: Right;   AMPUTATION Right 12/31/2017   Procedure: RIGHT TRANSMETATARSAL AMPUTATION;  Surgeon: Newt Minion, MD;  Location: [place];  Service: Orthopedics;  Laterality: Right;   AV FISTULA PLACEMENT Left 04/21/2016   Procedure: INSERTION OF ARTERIOVENOUS (AV) GORE-TEX GRAFT ARM LEFT;  Surgeon: Elam Dutch, MD;  Location: [place];  Service: Vascular;  Laterality: Left;   BASCILIC VEIN TRANSPOSITION Left 07/10/2014   Procedure: BASCILIC VEIN TRANSPOSITION;  Surgeon: Angelia Mould, MD;  Location: [place];  Service: Vascular;  Laterality: Left;   [place] Right 11/08/2014   Procedure: FIRST STAGE BASILIC VEIN TRANSPOSITION;  Surgeon: Angelia Mould, MD;  Location: [place];  Service: Vascular;  Laterality: Right;   [place] Right 01/18/2015   Procedure: SECOND STAGE BASILIC VEIN TRANSPOSITION;  Surgeon: Angelia Mould, MD;  Location: [place];  Service: Vascular;  Laterality: Right;   CRANIOTOMY N/A 08/23/2017   Procedure: CRANIOTOMY HEMATOMA EVACUATION SUBDURAL;  Surgeon: Ashok Pall, MD;  Location: [place];  Service: Neurosurgery;  Laterality: N/A;   CRANIOTOMY Left 08/24/2017   Procedure: CRANIOTOMY FOR RECURRENT ACUTE SUBDURAL HEMATOMA;  Surgeon: Ashok Pall, MD;  Location: [place];  Service: Neurosurgery;  Laterality: Left;   ESOPHAGOGASTRODUODENOSCOPY N/A 03/31/2013   Procedure: ESOPHAGOGASTRODUODENOSCOPY (EGD);  Surgeon: Gatha Mayer, MD;  Location: [facility] ENDOSCOPY;  Service: Endoscopy;  Laterality: N/A;   EYE SURGERY     laser surgery   FEMORAL-POPLITEAL BYPASS GRAFT Left 07/08/2016   Procedure: LEFT   FEMORAL-BELOW KNEE POPLITEAL ARTERY BYPASS GRAFT USING 6MM X 80 CM PROPATEN GORETEX GRAFT WITH RINGS.;  Surgeon: Rosetta Posner, MD;  Location[facility];  Service: Vascular;  Laterality: Left;   FEMORAL-POPLITEAL BYPASS GRAFT Right 08/11/2017   Procedure: RIGHT FEMORAL TO BELOW KNEE POPLITEAL ARTERKY  BYPASS GRAFT USING 6MM RINGED PROPATEN GRAFT;  Surgeon: Rosetta Posner, MD;  Location: [place];  Service: Vascular;  Laterality: Right;   FISTULOGRAM Left 10/29/2014   Procedure: FISTULOGRAM;  Surgeon: Angelia Mould, MD;  Location: [facility] CATH LAB;  Service: Cardiovascular;  Laterality: Left;   GAS/FLUID EXCHANGE Left 12/20/2018   Procedure: AIR GAS EXCHANGE;  Surgeon: Jalene Mullet, MD;  Location: [place];  Service: Ophthalmology;  Laterality: Left;   INSERTION OF DIALYSIS CATHETER N/A 09/05/2020   Procedure: INSERTION OF DIALYSIS CATHETER;  Surgeon:  Cherre Robins, MD;  Location: [place];  Service: Vascular;  Laterality: N/A;   INSERTION OF DIALYSIS CATHETER Left 03/13/2021   Procedure: INSERTION OF DIALYSIS CATHETER;  Surgeon: Waynetta Sandy, MD;  Location: [place];  Service: Vascular;  Laterality: Left;   KNEE ARTHROSCOPY Right 05/06/2018   Procedure: RIGHT KNEE ARTHROSCOPY;  Surgeon: Newt Minion, MD;  Location: [place];  Service: Orthopedics;  Laterality: Right;   KNEE ARTHROSCOPY Right 06/10/2018   Procedure: RIGHT KNEE ARTHROSCOPY AND DEBRIDEMENT;  Surgeon: Newt Minion, MD;  Location: [place];  Service: Orthopedics;  Laterality: Right;   LIGATION OF ARTERIOVENOUS  FISTULA Left 04/21/2016   Procedure: LIGATION OF ARTERIOVENOUS  FISTULA LEFT ARM;  Surgeon: Elam Dutch, MD;  Location: [place];  Service: Vascular;  Laterality: Left;   LOWER EXTREMITY ANGIOGRAPHY N/A 04/06/2017   Procedure: Lower Extremity Angiography - Right;  Surgeon: Serafina Mitchell, MD;  Location: [place] [facility] LAB;  Service: Cardiovascular;  Laterality: N/A;   MEMBRANE PEEL Left 12/20/2018   Procedure: MEMBRANE PEEL;   Surgeon: Jalene Mullet, MD;  Location: [place];  Service: Ophthalmology;  Laterality: Left;   MEMBRANE PEEL Right 05/29/2022   Procedure: MEMBRANE PEEL;  Surgeon: Hurman Horn, MD;  Location: [place];  Service: Ophthalmology;  Laterality: Right;   PARS PLANA VITRECTOMY Right 05/29/2022   Procedure: PARS PLANA VITRECTOMY WITH 25 GAUGE;  Surgeon: Hurman Horn, MD;  Location: [place];  Service: Ophthalmology;  Laterality: Right;   PATCH ANGIOPLASTY Right 01/18/2015   Procedure: BASILIC VEIN PATCH ANGIOPLASTY USING VASCUGUARD PATCH;  Surgeon: Angelia Mould, MD;  Location: [place];  Service: Vascular;  Laterality: Right;   PERIPHERAL VASCULAR BALLOON ANGIOPLASTY  09/22/2016   Procedure: Peripheral Vascular Balloon Angioplasty;  Surgeon: Serafina Mitchell, MD;  Location: [place] CV LAB;  Service: Cardiovascular;;  Lt. Fistula   PERIPHERAL VASCULAR BALLOON ANGIOPLASTY Left 03/22/2018   Procedure: PERIPHERAL VASCULAR BALLOON ANGIOPLASTY;  Surgeon: Serafina Mitchell, MD;  Location: [place] [facility] LAB;  Service: Cardiovascular;  Laterality: Left;  Arm shunt   PERIPHERAL VASCULAR CATHETERIZATION N/A 06/23/2016   Procedure: Abdominal Aortogram w/Lower Extremity;  Surgeon: Serafina Mitchell, MD;  Location: [place] CV LAB;  Service: Cardiovascular;  Laterality: N/A;   PERIPHERAL VASCULAR CATHETERIZATION  06/23/2016   Procedure: Peripheral Vascular Intervention;  Surgeon: Serafina Mitchell, MD;  Location: [place] [facility] LAB;  Service: Cardiovascular;;  lt common and external illiac artery   PHOTOCOAGULATION WITH LASER Left 12/20/2018   Procedure: PHOTOCOAGULATION WITH LASER;  Surgeon: Jalene Mullet, MD;  Location: [place];  Service: Ophthalmology;  Laterality: Left;   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   Procedure: REPAIR OF COMPLEX TRACTION RETINAL DETACHMENT;  Surgeon: Jalene Mullet, MD;  Location: [place];  Service: Ophthalmology;  Laterality: Left;   REVISION OF ARTERIOVENOUS  GORETEX GRAFT Left 09/05/2020   Procedure: REVISION OF ARTERIOVENOUS GORETEX GRAFT LEFT;  Surgeon: Cherre Robins, MD;  Location: [facility] OR;  Service: Vascular;  Laterality: Left;    OB History   No obstetric history on file.      Home Medications    Prior to Admission medications   Medication Sig Start Date End Date Taking? Authorizing Provider  amLODipine (NORVASC) 10 MG tablet Take 10 mg by mouth daily. 02/09/22  Yes [provider]  aspirin EC 81 MG tablet Take 81 mg by mouth daily.   Yes [provider]  atorvastatin (LIPITOR) 40 MG  tablet Take 40 mg by mouth at bedtime. 07/17/21  Yes [provider]  b complex-vitamin c-folic acid (NEPHRO-VITE) 0.8 MG TABS tablet Take 1 tablet by mouth at bedtime.   Yes [provider]  busPIRone (BUSPAR) 7.5 MG tablet Take 7.5 mg by mouth daily as needed (anxiety). 10/29/19  Yes [provider]  carvedilol (COREG) 12.5 MG tablet Take 12.5 mg by mouth See admin instructions. Take 1 tablet (12.5 mg) by mouth once daily in the evening on Mondays, Wednesdays, & Fridays. (Dialysis) Take 1 tablet (12.5 mg) by mouth twice daily on Sundays, Tuesdays, Thursdays, & Saturdays. (Non Dialysis)   Yes [provider]  cinacalcet (SENSIPAR) 60 MG tablet Take 60 mg by mouth See admin instructions. Take 1 tablet (60 mg) by mouth in the evening on Mondays, Wednesdays, & Fridays (dialysis) Take 1 tablet (60 mg) by mouth twice daily on Sundays, Tuesdays, Thursdays, Saturdays. (non dialysis) 11/28/18  Yes [provider]  diphenhydrAMINE HCl (BENADRYL ALLERGY PO) Take 1 capsule by mouth every Monday, Wednesday, and Friday with hemodialysis. If needed to help with allergies   Yes [provider]  Doxercalciferol (HECTOROL IV) Doxercalciferol (Hectorol) 08/06/21 08/05/22 Yes [provider]  ferric citrate (AURYXIA) 1 GM 210 MG(Fe) tablet Take 210-420 mg by mouth See admin instructions. Take 2 tablets  (420 mg) by mouth with each meal & take 1 tablet (210 mg) by mouth with snacks.   Yes [provider]  fluticasone (FLONASE) 50 MCG/ACT nasal spray Place 2 sprays into both nostrils daily as needed for allergies or rhinitis. 04/07/22  Yes Valentina Shaggy, MD  gabapentin (NEURONTIN) 100 MG capsule TAKE 1 CAPSULE (100 MG TOTAL) BY MOUTH AT BEDTIME. WHEN NECESSARY FOR NEUROPATHY PAIN Patient taking differently: Take 100 mg by mouth at bedtime as needed (When necessary for neuropathy pain). 12/13/18  Yes Newt Minion, MD  glipiZIDE (GLUCOTROL) 5 MG tablet Take 5 mg by mouth at bedtime.   Yes [provider]  isosorbide-hydrALAZINE (BIDIL) 20-37.5 MG tablet Take 1 tablet by mouth See admin instructions. hs on Monday,Wednesday,Friday (dialysis days) Tid on Tuesday,Thursday,Saturday,Sunday(non-dialysis days) Patient taking differently: Take 1 tablet by mouth See admin instructions. Take in the evening on Monday,Wednesday,Friday (dialysis days) BID on Tuesday,Thursday,Saturday,Sunday(non-dialysis days) 01/08/22  Yes Cantwell, Celeste C, PA-C  levocetirizine (XYZAL) 5 MG tablet Take 1 tablet (5 mg total) by mouth every evening. 04/07/22  Yes Valentina Shaggy, MD  lidocaine (LMX) 4 % cream Apply 1 Application topically every Monday, Wednesday, and Friday with hemodialysis. 05/22/22  Yes [provider]  loperamide (IMODIUM) 2 MG capsule Take 2 capsules (4 mg total) by mouth 3 (three) times daily as needed for diarrhea or loose stools. 08/04/21  Yes Bonnielee Haff, MD  Methoxy PEG-Epoetin Beta (MIRCERA IJ) Mircera 08/20/21 10/23/22 Yes [provider]  montelukast (SINGULAIR) 10 MG tablet TAKE 1 TABLET BY MOUTH EVERYDAY AT BEDTIME 04/07/22  Yes Valentina Shaggy, MD  ofloxacin (OCUFLOX) 0.3 % ophthalmic solution Place 1 drop into the right eye 4 (four) times daily. 05/27/22  Yes [provider]  omeprazole (PRILOSEC) 20 MG capsule Take 20 mg by mouth daily as  needed.   Yes [provider]  ondansetron (ZOFRAN) 4 MG tablet Take 1 tablet (4 mg total) by mouth every 6 (six) hours as needed for nausea. 05/22/19  Yes Aline August, MD  polyethylene glycol (MIRALAX / GLYCOLAX) 17 g packet Take 17 g by mouth daily. Patient taking differently: Take 17 g  by mouth daily as needed for mild constipation. 02/04/20  Yes Wieters, Hallie C, PA-C  prednisoLONE acetate (PRED FORTE) 1 % ophthalmic suspension Place 1 drop into the right eye 4 (four) times daily. 05/27/22  Yes [provider]  Spacer/Aero-Holding Chambers (AEROCHAMBER PLUS) inhaler Use with inhaler 03/09/21  Yes Melynda Ripple, MD  traZODone (DESYREL) 50 MG tablet Take 25-50 mg by mouth at bedtime as needed for sleep. 10/29/19  Yes [provider]  albuterol (VENTOLIN HFA) 108 (90 Base) MCG/ACT inhaler Inhale 2 puffs into the lungs every 4 (four) hours as needed for wheezing or shortness of breath. Patient not taking: Reported on 05/28/2022 04/07/22   Valentina Shaggy, MD  budesonide-formoterol [facility]) 160-4.5 MCG/ACT inhaler Inhale 2 puffs into the lungs 2 (two) times daily. Patient not taking: Reported on 05/28/2022 04/07/22   Valentina Shaggy, MD  EPINEPHrine (AUVI-Q) 0.3 mg/0.3 mL IJ SOAJ injection Inject 0.3 mg into the muscle as needed for anaphylaxis. 12/24/20   Valentina Shaggy, MD  fluticasone-salmeterol (ADVAIR [facility]) [phone] MCG/ACT inhaler Inhale two puffs twice daily Patient not taking: Reported on 05/28/2022 12/24/20   Valentina Shaggy, MD  guaiFENesin-codeine 100-10 MG/5ML syrup Take 5 mLs by mouth 3 (three) times daily as needed for cough. 07/08/21   Valentina Shaggy, MD  latanoprost (XALATAN) 0.005 % ophthalmic solution Place 1 drop into the left eye at bedtime. Patient not taking: Reported on 05/28/2022 11/25/21 11/25/22  Rankin, Clent Demark, MD  lidocaine-prilocaine (EMLA) cream Apply 1 Application topically as needed. Patient not taking: Reported on  05/28/2022 03/31/22   Vanessa Kick, MD    Family History Family History  Problem Relation Age of Onset   Other Mother        not sure of cause of death   Diabetes Father    Diabetes Sister    Diabetes Sister    Pancreatic cancer Maternal Grandmother    Colon cancer Neg Hx     Social History Social History   Tobacco Use   Smoking status: Former    Packs/day: 0.35    Years: 40.00    Total pack years: 14.00    Types: Cigarettes    Quit date: 05/10/2012    Years since quitting: 10.2   Smokeless tobacco: Never  Vaping Use   Vaping Use: Never used  Substance Use Topics   Alcohol use: No   Drug use: No    Comment: marijuana; quit in early 1980's     Allergies   Adhesive  [tape], Lisinopril, and Prednisone   Review of Systems Review of Systems  Constitutional:  Negative for chills and fever.  HENT:  Positive for rhinorrhea.   Respiratory:  Positive for cough and wheezing.   Gastrointestinal: Negative.   Genitourinary: Negative.   Musculoskeletal: Negative.   Psychiatric/Behavioral: Negative.       Physical Exam Triage Vital Signs ED Triage Vitals  Enc Vitals Group     BP 07/21/22 1231 (!) 200/63     Pulse Rate 07/21/22 1231 90     Resp 07/21/22 1231 20     Temp 07/21/22 1231 98 F (36.7 C)     Temp Source 07/21/22 1231 Oral     SpO2 07/21/22 1231 98 %     Weight --      Height --      Head Circumference --      Peak Flow --      Pain Score 07/21/22 1228 0     Pain  Loc --      Pain Edu? --      Excl. in [place]? --    No data found.  Updated Vital Signs BP (!) 200/63 (BP Location: Right Arm)   Pulse 90   Temp 98 F (36.7 C) (Oral)   Resp 20   SpO2 98%   Visual Acuity Right Eye Distance:   Left Eye Distance:   Bilateral Distance:    Right Eye Near:   Left Eye Near:    Bilateral Near:     Physical Exam Constitutional:      General: She is not in acute distress.    Appearance: Normal appearance. She is not ill-appearing.  HENT:     Nose:  Rhinorrhea present.  Cardiovascular:     Rate and Rhythm: Normal rate and regular rhythm.  Pulmonary:     Effort: Pulmonary effort is normal.     Breath sounds: Wheezing present.     Comments: Scattered wheezes, mild;   Musculoskeletal:     Cervical back: Normal range of motion and neck supple.  Skin:    General: Skin is warm.  Neurological:     General: No focal deficit present.     Mental Status: She is alert.  Psychiatric:        Mood and Affect: Mood normal.      UC Treatments / Results  Labs (all labs ordered are listed, but only abnormal results are displayed) Labs Reviewed - No data to display  EKG   Radiology No results found.  Procedures Procedures (including critical care time)  Medications Ordered in UC Medications  albuterol (PROVENTIL) (2.5 MG/3ML) 0.083% nebulizer solution 2.5 mg (2.5 mg Nebulization Given 07/21/22 1249)     Initial Impression / Assessment and Plan / UC Course  I have reviewed the triage vital signs and the nursing notes.  Pertinent labs & imaging results that were available during my care of the patient were reviewed by me and considered in my medical decision making (see chart for details).  Patient was seen today for asthma exacerbation.  She cannot afford her inhalers and has not used them recently.  Of note, she also did not take her bp medications this morning.  Albuterol neb given today, and s/p treatment the wheezing was much improved with good aeration.   Encouraged the patient to get the script for albuterol HFA today as this will be most beneficial for her.   Her bp is also elevated today.  She has not taken her medications today.  She denies CP, headache, dizziness or confusion.  As a result I do not think she needs to go to the ER at this time, but take her bp medications at home asap.   Final Clinical Impressions(s) / UC Diagnoses   Final diagnoses:  Mild intermittent asthma with acute exacerbation  Essential  hypertension     Discharge Instructions      You were seen today for asthma exacerbation.  I have sent out an albuterol inhaler and cough syrup for you today.   Please return if not improving as expected.   I also recommend you go home and take your blood pressure medication as soon as possible as your blood pressure is elevated today.     ED Prescriptions     Medication Sig Dispense Auth. Provider   albuterol (VENTOLIN HFA) 108 (90 Base) MCG/ACT inhaler Inhale 2 puffs into the lungs every 4 (four) hours as needed for wheezing or shortness of breath.  1 each Rondel Oh, MD   promethazine-dextromethorphan (PROMETHAZINE-DM) 6.25-15 MG/5ML syrup Take 5 mLs by mouth 4 (four) times daily as needed for cough. 118 mL Rondel Oh, MD      PDMP not reviewed this encounter.   Rondel Oh, MD 07/21/22 [phone]

## 2022-07-21 NOTE — Discharge Instructions (Signed)
You were seen today for asthma exacerbation.  I have sent out an albuterol inhaler and cough syrup for you today.   Please return if not improving as expected.   I also recommend you go home and take your blood pressure medication as soon as possible as your blood pressure is elevated today.

## 2022-07-22 DIAGNOSIS — Z992 Dependence on renal dialysis: Secondary | ICD-10-CM | POA: Diagnosis not present

## 2022-07-22 DIAGNOSIS — D631 Anemia in chronic kidney disease: Secondary | ICD-10-CM | POA: Diagnosis not present

## 2022-07-22 DIAGNOSIS — D509 Iron deficiency anemia, unspecified: Secondary | ICD-10-CM | POA: Diagnosis not present

## 2022-07-22 DIAGNOSIS — N186 End stage renal disease: Secondary | ICD-10-CM | POA: Diagnosis not present

## 2022-07-22 DIAGNOSIS — N2581 Secondary hyperparathyroidism of renal origin: Secondary | ICD-10-CM | POA: Diagnosis not present

## 2022-07-24 DIAGNOSIS — Z992 Dependence on renal dialysis: Secondary | ICD-10-CM | POA: Diagnosis not present

## 2022-07-24 DIAGNOSIS — D631 Anemia in chronic kidney disease: Secondary | ICD-10-CM | POA: Diagnosis not present

## 2022-07-24 DIAGNOSIS — N186 End stage renal disease: Secondary | ICD-10-CM | POA: Diagnosis not present

## 2022-07-24 DIAGNOSIS — N2581 Secondary hyperparathyroidism of renal origin: Secondary | ICD-10-CM | POA: Diagnosis not present

## 2022-07-24 DIAGNOSIS — D509 Iron deficiency anemia, unspecified: Secondary | ICD-10-CM | POA: Diagnosis not present

## 2022-07-26 DIAGNOSIS — N186 End stage renal disease: Secondary | ICD-10-CM | POA: Diagnosis not present

## 2022-07-26 DIAGNOSIS — Z992 Dependence on renal dialysis: Secondary | ICD-10-CM | POA: Diagnosis not present

## 2022-07-26 DIAGNOSIS — D631 Anemia in chronic kidney disease: Secondary | ICD-10-CM | POA: Diagnosis not present

## 2022-07-26 DIAGNOSIS — D509 Iron deficiency anemia, unspecified: Secondary | ICD-10-CM | POA: Diagnosis not present

## 2022-07-26 DIAGNOSIS — N2581 Secondary hyperparathyroidism of renal origin: Secondary | ICD-10-CM | POA: Diagnosis not present

## 2022-07-28 DIAGNOSIS — K8020 Calculus of gallbladder without cholecystitis without obstruction: Secondary | ICD-10-CM | POA: Diagnosis not present

## 2022-07-28 DIAGNOSIS — N186 End stage renal disease: Secondary | ICD-10-CM | POA: Diagnosis not present

## 2022-07-28 DIAGNOSIS — I132 Hypertensive heart and chronic kidney disease with heart failure and with stage 5 chronic kidney disease, or end stage renal disease: Secondary | ICD-10-CM | POA: Diagnosis not present

## 2022-07-28 DIAGNOSIS — E1122 Type 2 diabetes mellitus with diabetic chronic kidney disease: Secondary | ICD-10-CM | POA: Diagnosis not present

## 2022-07-28 DIAGNOSIS — D649 Anemia, unspecified: Secondary | ICD-10-CM | POA: Diagnosis not present

## 2022-07-28 DIAGNOSIS — E213 Hyperparathyroidism, unspecified: Secondary | ICD-10-CM | POA: Diagnosis not present

## 2022-07-28 DIAGNOSIS — Z992 Dependence on renal dialysis: Secondary | ICD-10-CM | POA: Diagnosis not present

## 2022-07-28 DIAGNOSIS — Z79899 Other long term (current) drug therapy: Secondary | ICD-10-CM | POA: Diagnosis not present

## 2022-07-28 DIAGNOSIS — Z794 Long term (current) use of insulin: Secondary | ICD-10-CM | POA: Diagnosis not present

## 2022-07-28 DIAGNOSIS — J4540 Moderate persistent asthma, uncomplicated: Secondary | ICD-10-CM | POA: Diagnosis not present

## 2022-07-29 DIAGNOSIS — N186 End stage renal disease: Secondary | ICD-10-CM | POA: Diagnosis not present

## 2022-07-29 DIAGNOSIS — D631 Anemia in chronic kidney disease: Secondary | ICD-10-CM | POA: Diagnosis not present

## 2022-07-29 DIAGNOSIS — Z992 Dependence on renal dialysis: Secondary | ICD-10-CM | POA: Diagnosis not present

## 2022-07-29 DIAGNOSIS — N2581 Secondary hyperparathyroidism of renal origin: Secondary | ICD-10-CM | POA: Diagnosis not present

## 2022-07-30 ENCOUNTER — Emergency Department (HOSPITAL_COMMUNITY)
Admission: EM | Admit: 2022-07-30 | Discharge: 2022-07-31 | Disposition: A | Discharge status: Home / Self Care | Payer: Medicare Other | Source: Home / Self Care | Attending: Emergency Medicine | Admitting: Emergency Medicine

## 2022-07-30 ENCOUNTER — Other Ambulatory Visit: Payer: Self-pay

## 2022-07-30 ENCOUNTER — Encounter (HOSPITAL_COMMUNITY): Payer: Self-pay | Admitting: Emergency Medicine

## 2022-07-30 DIAGNOSIS — N261 Atrophy of kidney (terminal): Secondary | ICD-10-CM | POA: Diagnosis not present

## 2022-07-30 DIAGNOSIS — N281 Cyst of kidney, acquired: Secondary | ICD-10-CM | POA: Diagnosis not present

## 2022-07-30 DIAGNOSIS — E1122 Type 2 diabetes mellitus with diabetic chronic kidney disease: Secondary | ICD-10-CM | POA: Insufficient documentation

## 2022-07-30 DIAGNOSIS — K828 Other specified diseases of gallbladder: Secondary | ICD-10-CM | POA: Diagnosis not present

## 2022-07-30 DIAGNOSIS — I70222 Atherosclerosis of native arteries of extremities with rest pain, left leg: Secondary | ICD-10-CM | POA: Diagnosis not present

## 2022-07-30 DIAGNOSIS — K659 Peritonitis, unspecified: Secondary | ICD-10-CM | POA: Diagnosis not present

## 2022-07-30 DIAGNOSIS — J150 Pneumonia due to Klebsiella pneumoniae: Secondary | ICD-10-CM | POA: Diagnosis present

## 2022-07-30 DIAGNOSIS — A419 Sepsis, unspecified organism: Secondary | ICD-10-CM | POA: Diagnosis not present

## 2022-07-30 DIAGNOSIS — I70292 Other atherosclerosis of native arteries of extremities, left leg: Secondary | ICD-10-CM | POA: Diagnosis present

## 2022-07-30 DIAGNOSIS — Z7982 Long term (current) use of aspirin: Secondary | ICD-10-CM | POA: Insufficient documentation

## 2022-07-30 DIAGNOSIS — K8020 Calculus of gallbladder without cholecystitis without obstruction: Secondary | ICD-10-CM | POA: Diagnosis not present

## 2022-07-30 DIAGNOSIS — N250 Renal osteodystrophy: Secondary | ICD-10-CM | POA: Diagnosis not present

## 2022-07-30 DIAGNOSIS — R109 Unspecified abdominal pain: Secondary | ICD-10-CM | POA: Diagnosis not present

## 2022-07-30 DIAGNOSIS — K651 Peritoneal abscess: Secondary | ICD-10-CM | POA: Diagnosis not present

## 2022-07-30 DIAGNOSIS — I130 Hypertensive heart and chronic kidney disease with heart failure and stage 1 through stage 4 chronic kidney disease, or unspecified chronic kidney disease: Secondary | ICD-10-CM | POA: Diagnosis not present

## 2022-07-30 DIAGNOSIS — E041 Nontoxic single thyroid nodule: Secondary | ICD-10-CM | POA: Diagnosis present

## 2022-07-30 DIAGNOSIS — K632 Fistula of intestine: Secondary | ICD-10-CM | POA: Diagnosis not present

## 2022-07-30 DIAGNOSIS — K3533 Acute appendicitis with perforation and localized peritonitis, with abscess: Secondary | ICD-10-CM | POA: Diagnosis not present

## 2022-07-30 DIAGNOSIS — R64 Cachexia: Secondary | ICD-10-CM | POA: Diagnosis present

## 2022-07-30 DIAGNOSIS — I998 Other disorder of circulatory system: Secondary | ICD-10-CM | POA: Diagnosis not present

## 2022-07-30 DIAGNOSIS — R197 Diarrhea, unspecified: Secondary | ICD-10-CM | POA: Insufficient documentation

## 2022-07-30 DIAGNOSIS — J9601 Acute respiratory failure with hypoxia: Secondary | ICD-10-CM | POA: Diagnosis not present

## 2022-07-30 DIAGNOSIS — R1084 Generalized abdominal pain: Secondary | ICD-10-CM | POA: Diagnosis not present

## 2022-07-30 DIAGNOSIS — J9690 Respiratory failure, unspecified, unspecified whether with hypoxia or hypercapnia: Secondary | ICD-10-CM | POA: Diagnosis not present

## 2022-07-30 DIAGNOSIS — Z87891 Personal history of nicotine dependence: Secondary | ICD-10-CM | POA: Diagnosis not present

## 2022-07-30 DIAGNOSIS — N186 End stage renal disease: Secondary | ICD-10-CM | POA: Insufficient documentation

## 2022-07-30 DIAGNOSIS — Z66 Do not resuscitate: Secondary | ICD-10-CM | POA: Diagnosis not present

## 2022-07-30 DIAGNOSIS — I701 Atherosclerosis of renal artery: Secondary | ICD-10-CM | POA: Diagnosis not present

## 2022-07-30 DIAGNOSIS — Z515 Encounter for palliative care: Secondary | ICD-10-CM | POA: Diagnosis not present

## 2022-07-30 DIAGNOSIS — R1011 Right upper quadrant pain: Secondary | ICD-10-CM | POA: Diagnosis not present

## 2022-07-30 DIAGNOSIS — R110 Nausea: Secondary | ICD-10-CM | POA: Insufficient documentation

## 2022-07-30 DIAGNOSIS — N189 Chronic kidney disease, unspecified: Secondary | ICD-10-CM | POA: Diagnosis not present

## 2022-07-30 DIAGNOSIS — D631 Anemia in chronic kidney disease: Secondary | ICD-10-CM | POA: Diagnosis not present

## 2022-07-30 DIAGNOSIS — Z992 Dependence on renal dialysis: Secondary | ICD-10-CM | POA: Insufficient documentation

## 2022-07-30 DIAGNOSIS — K4090 Unilateral inguinal hernia, without obstruction or gangrene, not specified as recurrent: Secondary | ICD-10-CM | POA: Diagnosis not present

## 2022-07-30 DIAGNOSIS — Z7189 Other specified counseling: Secondary | ICD-10-CM | POA: Diagnosis not present

## 2022-07-30 DIAGNOSIS — R739 Hyperglycemia, unspecified: Secondary | ICD-10-CM | POA: Diagnosis not present

## 2022-07-30 DIAGNOSIS — R1031 Right lower quadrant pain: Secondary | ICD-10-CM | POA: Diagnosis not present

## 2022-07-30 DIAGNOSIS — I10 Essential (primary) hypertension: Secondary | ICD-10-CM | POA: Diagnosis not present

## 2022-07-30 DIAGNOSIS — Z89619 Acquired absence of unspecified leg above knee: Secondary | ICD-10-CM | POA: Diagnosis not present

## 2022-07-30 DIAGNOSIS — K3532 Acute appendicitis with perforation and localized peritonitis, without abscess: Secondary | ICD-10-CM | POA: Diagnosis not present

## 2022-07-30 DIAGNOSIS — Z79899 Other long term (current) drug therapy: Secondary | ICD-10-CM | POA: Insufficient documentation

## 2022-07-30 DIAGNOSIS — R918 Other nonspecific abnormal finding of lung field: Secondary | ICD-10-CM | POA: Diagnosis not present

## 2022-07-30 DIAGNOSIS — E871 Hypo-osmolality and hyponatremia: Secondary | ICD-10-CM | POA: Insufficient documentation

## 2022-07-30 DIAGNOSIS — G9341 Metabolic encephalopathy: Secondary | ICD-10-CM | POA: Diagnosis not present

## 2022-07-30 DIAGNOSIS — E876 Hypokalemia: Secondary | ICD-10-CM | POA: Insufficient documentation

## 2022-07-30 DIAGNOSIS — R410 Disorientation, unspecified: Secondary | ICD-10-CM | POA: Diagnosis not present

## 2022-07-30 DIAGNOSIS — I739 Peripheral vascular disease, unspecified: Secondary | ICD-10-CM | POA: Diagnosis not present

## 2022-07-30 DIAGNOSIS — R0602 Shortness of breath: Secondary | ICD-10-CM | POA: Diagnosis not present

## 2022-07-30 DIAGNOSIS — I132 Hypertensive heart and chronic kidney disease with heart failure and with stage 5 chronic kidney disease, or end stage renal disease: Secondary | ICD-10-CM | POA: Diagnosis present

## 2022-07-30 DIAGNOSIS — I70223 Atherosclerosis of native arteries of extremities with rest pain, bilateral legs: Secondary | ICD-10-CM | POA: Diagnosis not present

## 2022-07-30 DIAGNOSIS — F419 Anxiety disorder, unspecified: Secondary | ICD-10-CM | POA: Diagnosis not present

## 2022-07-30 DIAGNOSIS — Z978 Presence of other specified devices: Secondary | ICD-10-CM | POA: Diagnosis not present

## 2022-07-30 DIAGNOSIS — R0902 Hypoxemia: Secondary | ICD-10-CM | POA: Diagnosis not present

## 2022-07-30 DIAGNOSIS — K829 Disease of gallbladder, unspecified: Secondary | ICD-10-CM | POA: Diagnosis not present

## 2022-07-30 DIAGNOSIS — E440 Moderate protein-calorie malnutrition: Secondary | ICD-10-CM | POA: Diagnosis not present

## 2022-07-30 DIAGNOSIS — J690 Pneumonitis due to inhalation of food and vomit: Secondary | ICD-10-CM | POA: Diagnosis not present

## 2022-07-30 DIAGNOSIS — J449 Chronic obstructive pulmonary disease, unspecified: Secondary | ICD-10-CM | POA: Diagnosis not present

## 2022-07-30 DIAGNOSIS — L02211 Cutaneous abscess of abdominal wall: Secondary | ICD-10-CM | POA: Diagnosis not present

## 2022-07-30 DIAGNOSIS — I70202 Unspecified atherosclerosis of native arteries of extremities, left leg: Secondary | ICD-10-CM | POA: Diagnosis not present

## 2022-07-30 DIAGNOSIS — Z7984 Long term (current) use of oral hypoglycemic drugs: Secondary | ICD-10-CM | POA: Diagnosis not present

## 2022-07-30 DIAGNOSIS — Z4682 Encounter for fitting and adjustment of non-vascular catheter: Secondary | ICD-10-CM | POA: Diagnosis not present

## 2022-07-30 DIAGNOSIS — I509 Heart failure, unspecified: Secondary | ICD-10-CM | POA: Diagnosis not present

## 2022-07-30 DIAGNOSIS — I120 Hypertensive chronic kidney disease with stage 5 chronic kidney disease or end stage renal disease: Secondary | ICD-10-CM | POA: Diagnosis not present

## 2022-07-30 DIAGNOSIS — G928 Other toxic encephalopathy: Secondary | ICD-10-CM | POA: Diagnosis not present

## 2022-07-30 DIAGNOSIS — M79606 Pain in leg, unspecified: Secondary | ICD-10-CM | POA: Diagnosis not present

## 2022-07-30 DIAGNOSIS — N739 Female pelvic inflammatory disease, unspecified: Secondary | ICD-10-CM | POA: Diagnosis not present

## 2022-07-30 DIAGNOSIS — Y832 Surgical operation with anastomosis, bypass or graft as the cause of abnormal reaction of the patient, or of later complication, without mention of misadventure at the time of the procedure: Secondary | ICD-10-CM | POA: Diagnosis present

## 2022-07-30 DIAGNOSIS — R6521 Severe sepsis with septic shock: Secondary | ICD-10-CM | POA: Diagnosis not present

## 2022-07-30 DIAGNOSIS — I6782 Cerebral ischemia: Secondary | ICD-10-CM | POA: Diagnosis not present

## 2022-07-30 DIAGNOSIS — E43 Unspecified severe protein-calorie malnutrition: Secondary | ICD-10-CM | POA: Diagnosis present

## 2022-07-30 DIAGNOSIS — E8721 Acute metabolic acidosis: Secondary | ICD-10-CM | POA: Diagnosis not present

## 2022-07-30 DIAGNOSIS — E1141 Type 2 diabetes mellitus with diabetic mononeuropathy: Secondary | ICD-10-CM | POA: Diagnosis present

## 2022-07-30 DIAGNOSIS — Z0389 Encounter for observation for other suspected diseases and conditions ruled out: Secondary | ICD-10-CM | POA: Diagnosis not present

## 2022-07-30 DIAGNOSIS — N2581 Secondary hyperparathyroidism of renal origin: Secondary | ICD-10-CM | POA: Diagnosis not present

## 2022-07-30 DIAGNOSIS — R209 Unspecified disturbances of skin sensation: Secondary | ICD-10-CM | POA: Diagnosis not present

## 2022-07-30 DIAGNOSIS — D72829 Elevated white blood cell count, unspecified: Secondary | ICD-10-CM | POA: Diagnosis not present

## 2022-07-30 DIAGNOSIS — M009 Pyogenic arthritis, unspecified: Secondary | ICD-10-CM | POA: Diagnosis not present

## 2022-07-30 DIAGNOSIS — I5042 Chronic combined systolic (congestive) and diastolic (congestive) heart failure: Secondary | ICD-10-CM | POA: Diagnosis present

## 2022-07-30 DIAGNOSIS — K6819 Other retroperitoneal abscess: Secondary | ICD-10-CM | POA: Diagnosis present

## 2022-07-30 DIAGNOSIS — R4182 Altered mental status, unspecified: Secondary | ICD-10-CM | POA: Diagnosis not present

## 2022-07-30 DIAGNOSIS — R932 Abnormal findings on diagnostic imaging of liver and biliary tract: Secondary | ICD-10-CM | POA: Diagnosis not present

## 2022-07-30 DIAGNOSIS — R112 Nausea with vomiting, unspecified: Secondary | ICD-10-CM | POA: Diagnosis not present

## 2022-07-30 LAB — CBC WITH DIFFERENTIAL/PLATELET
Abs Immature Granulocytes: 0.08 10*3/uL — ABNORMAL HIGH (ref 0.00–0.07)
Basophils Absolute: 0 10*3/uL (ref 0.0–0.1)
Basophils Relative: 0 %
Eosinophils Absolute: 0.1 10*3/uL (ref 0.0–0.5)
Eosinophils Relative: 1 %
HCT: 41.9 % (ref 36.0–46.0)
Hemoglobin: 13.6 g/dL (ref 12.0–15.0)
Immature Granulocytes: 1 %
Lymphocytes Relative: 10 %
Lymphs Abs: 1.3 10*3/uL (ref 0.7–4.0)
MCH: 31.8 pg (ref 26.0–34.0)
MCHC: 32.5 g/dL (ref 30.0–36.0)
MCV: 97.9 fL (ref 80.0–100.0)
Monocytes Absolute: 0.6 10*3/uL (ref 0.1–1.0)
Monocytes Relative: 5 %
Neutro Abs: 11.5 10*3/uL — ABNORMAL HIGH (ref 1.7–7.7)
Neutrophils Relative %: 83 %
Platelets: 237 10*3/uL (ref 150–400)
RBC: 4.28 MIL/uL (ref 3.87–5.11)
RDW: 13 % (ref 11.5–15.5)
WBC: 13.7 10*3/uL — ABNORMAL HIGH (ref 4.0–10.5)
nRBC: 0 % (ref 0.0–0.2)

## 2022-07-30 LAB — COMPREHENSIVE METABOLIC PANEL
ALT: 11 U/L (ref 0–44)
AST: 15 U/L (ref 15–41)
Albumin: 3.4 g/dL — ABNORMAL LOW (ref 3.5–5.0)
Alkaline Phosphatase: 84 U/L (ref 38–126)
Anion gap: 16 — ABNORMAL HIGH (ref 5–15)
BUN: 13 mg/dL (ref 8–23)
CO2: 27 mmol/L (ref 22–32)
Calcium: 10.2 mg/dL (ref 8.9–10.3)
Chloride: 91 mmol/L — ABNORMAL LOW (ref 98–111)
Creatinine, Ser: 5.19 mg/dL — ABNORMAL HIGH (ref 0.44–1.00)
GFR, Estimated: 8 mL/min — ABNORMAL LOW (ref >60)
Glucose, Bld: 128 mg/dL — ABNORMAL HIGH (ref 70–99)
Potassium: 3.3 mmol/L — ABNORMAL LOW (ref 3.5–5.1)
Sodium: 134 mmol/L — ABNORMAL LOW (ref 135–145)
Total Bilirubin: 0.6 mg/dL (ref 0.3–1.2)
Total Protein: 8 g/dL (ref 6.5–8.1)

## 2022-07-30 LAB — LIPASE, BLOOD: Lipase: 34 U/L (ref 11–51)

## 2022-07-30 MED ORDER — ONDANSETRON HCL 4 MG/2ML IJ SOLN
4.0000 mg | Freq: Once | INTRAMUSCULAR | Status: AC
  Administered 2022-07-31: 4 mg via INTRAVENOUS
  Filled 2022-07-30: qty 2

## 2022-07-30 MED ORDER — MORPHINE SULFATE (PF) 4 MG/ML IV SOLN
4.0000 mg | Freq: Once | INTRAVENOUS | Status: AC
  Administered 2022-07-31: 4 mg via INTRAVENOUS
  Filled 2022-07-30: qty 1

## 2022-07-30 NOTE — ED Triage Notes (Signed)
Pt reports right sided lower abdominal pain that started today at noon. Pt denies n/v. Dialysis pt.

## 2022-07-30 NOTE — ED Provider Notes (Signed)
[facility] EMERGENCY DEPARTMENT Provider Note   CSN: [id number] Arrival date & time: 07/30/22  1724     History  Chief Complaint  Patient presents with   Abdominal Pain    Destiny Day is a 72 y.o. female.  The history is provided by the patient.  Abdominal Pain She has history of hypertension, diabetes, hyperlipidemia, end-stage renal disease on hemodialysis, GERD, heart failure, peripheral vascular disease and comes in because of right lower abdominal pain which started about noon today.  Pain does not radiate.  There is associated nausea but no vomiting.  She denies diarrhea although she states she had diarrhea 2 days ago.  She denies fever, chills, sweats.  She does not make urine.  Last dialysis was yesterday.   Home Medications Prior to Admission medications   Medication Sig Start Date End Date Taking? Authorizing Provider  albuterol (VENTOLIN HFA) 108 (90 Base) MCG/ACT inhaler Inhale 2 puffs into the lungs every 4 (four) hours as needed for wheezing or shortness of breath. 07/21/22   Piontek, Junie Panning, MD  amLODipine (NORVASC) 10 MG tablet Take 10 mg by mouth daily. 02/09/22   [provider]  aspirin EC 81 MG tablet Take 81 mg by mouth daily.    [provider]  atorvastatin (LIPITOR) 40 MG tablet Take 40 mg by mouth at bedtime. 07/17/21   [provider]  b complex-vitamin c-folic acid (NEPHRO-VITE) 0.8 MG TABS tablet Take 1 tablet by mouth at bedtime.    [provider]  budesonide-formoterol (SYMBICORT) 160-4.5 MCG/ACT inhaler Inhale 2 puffs into the lungs 2 (two) times daily. Patient not taking: Reported on 05/28/2022 04/07/22   Valentina Shaggy, MD  busPIRone (BUSPAR) 7.5 MG tablet Take 7.5 mg by mouth daily as needed (anxiety). 10/29/19   [provider]  carvedilol (COREG) 12.5 MG tablet Take 12.5 mg by mouth See admin instructions. Take 1 tablet (12.5 mg) by mouth once daily in the evening on Mondays,  Wednesdays, & Fridays. (Dialysis) Take 1 tablet (12.5 mg) by mouth twice daily on Sundays, Tuesdays, Thursdays, & Saturdays. (Non Dialysis)    [provider]  cinacalcet (SENSIPAR) 60 MG tablet Take 60 mg by mouth See admin instructions. Take 1 tablet (60 mg) by mouth in the evening on Mondays, Wednesdays, & Fridays (dialysis) Take 1 tablet (60 mg) by mouth twice daily on Sundays, Tuesdays, Thursdays, Saturdays. (non dialysis) 11/28/18   [provider]  diphenhydrAMINE HCl (BENADRYL ALLERGY PO) Take 1 capsule by mouth every Monday, Wednesday, and Friday with hemodialysis. If needed to help with allergies    [provider]  Doxercalciferol (HECTOROL IV) Doxercalciferol (Hectorol) 08/06/21 08/05/22  [provider]  EPINEPHrine (AUVI-Q) 0.3 mg/0.3 mL IJ SOAJ injection Inject 0.3 mg into the muscle as needed for anaphylaxis. 12/24/20   Valentina Shaggy, MD  ferric citrate (AURYXIA) 1 GM 210 MG(Fe) tablet Take 210-420 mg by mouth See admin instructions. Take 2 tablets (420 mg) by mouth with each meal & take 1 tablet (210 mg) by mouth with snacks.    [provider]  fluticasone (FLONASE) 50 MCG/ACT nasal spray Place 2 sprays into both nostrils daily as needed for allergies or rhinitis. 04/07/22   Valentina Shaggy, MD  fluticasone-salmeterol (ADVAIR [facility]) [phone] MCG/ACT inhaler Inhale two puffs twice daily Patient not taking: Reported on 05/28/2022 12/24/20   Valentina Shaggy, MD  gabapentin (NEURONTIN) 100 MG capsule TAKE 1 CAPSULE (100 MG TOTAL) BY MOUTH AT BEDTIME. WHEN  NECESSARY FOR NEUROPATHY PAIN Patient taking differently: Take 100 mg by mouth at bedtime as needed (When necessary for neuropathy pain). 12/13/18   Newt Minion, MD  glipiZIDE (GLUCOTROL) 5 MG tablet Take 5 mg by mouth at bedtime.    [provider]  guaiFENesin-codeine 100-10 MG/5ML syrup Take 5 mLs by mouth 3 (three) times daily as needed for cough. 07/08/21   Valentina Shaggy, MD  isosorbide-hydrALAZINE (BIDIL) 20-37.5 MG tablet Take 1 tablet by mouth See admin instructions. hs on Monday,Wednesday,Friday (dialysis days) Tid on Tuesday,Thursday,Saturday,Sunday(non-dialysis days) Patient taking differently: Take 1 tablet by mouth See admin instructions. Take in the evening on Monday,Wednesday,Friday (dialysis days) BID on Tuesday,Thursday,Saturday,Sunday(non-dialysis days) 01/08/22   Cantwell, Celeste C, PA-C  latanoprost (XALATAN) 0.005 % ophthalmic solution Place 1 drop into the left eye at bedtime. Patient not taking: Reported on 05/28/2022 11/25/21 11/25/22  Rankin, Clent Demark, MD  levocetirizine (XYZAL) 5 MG tablet Take 1 tablet (5 mg total) by mouth every evening. 04/07/22   Valentina Shaggy, MD  lidocaine (LMX) 4 % cream Apply 1 Application topically every Monday, Wednesday, and Friday with hemodialysis. 05/22/22   [provider]  lidocaine-prilocaine (EMLA) cream Apply 1 Application topically as needed. Patient not taking: Reported on 05/28/2022 03/31/22   Vanessa Kick, MD  loperamide (IMODIUM) 2 MG capsule Take 2 capsules (4 mg total) by mouth 3 (three) times daily as needed for diarrhea or loose stools. 08/04/21   Bonnielee Haff, MD  Methoxy PEG-Epoetin Beta (MIRCERA IJ) Mircera 08/20/21 10/23/22  [provider]  montelukast (SINGULAIR) 10 MG tablet TAKE 1 TABLET BY MOUTH EVERYDAY AT BEDTIME 04/07/22   Valentina Shaggy, MD  ofloxacin (OCUFLOX) 0.3 % ophthalmic solution Place 1 drop into the right eye 4 (four) times daily. 05/27/22   [provider]  omeprazole (PRILOSEC) 20 MG capsule Take 20 mg by mouth daily as needed.    [provider]  ondansetron (ZOFRAN) 4 MG tablet Take 1 tablet (4 mg total) by mouth every 6 (six) hours as needed for nausea. 05/22/19   Aline August, MD  polyethylene glycol (MIRALAX / GLYCOLAX) 17 g packet Take 17 g by mouth daily. Patient taking differently: Take 17 g by mouth daily as needed  for mild constipation. 02/04/20   Wieters, Hallie C, PA-C  prednisoLONE acetate (PRED FORTE) 1 % ophthalmic suspension Place 1 drop into the right eye 4 (four) times daily. 05/27/22   [provider]  promethazine-dextromethorphan (PROMETHAZINE-DM) 6.25-15 MG/5ML syrup Take 5 mLs by mouth 4 (four) times daily as needed for cough. 07/21/22   Rondel Oh, MD  Spacer/Aero-Holding Chambers (AEROCHAMBER PLUS) inhaler Use with inhaler 03/09/21   Melynda Ripple, MD  traZODone (DESYREL) 50 MG tablet Take 25-50 mg by mouth at bedtime as needed for sleep. 10/29/19   [provider]      Allergies    Adhesive  [tape], Lisinopril, and Prednisone    Review of Systems   Review of Systems  Gastrointestinal:  Positive for abdominal pain.  All other systems reviewed and are negative.   Physical Exam Updated Vital Signs BP 131/81   Pulse (!) 101   Temp 99 F (37.2 C) (Oral)   Resp 16   SpO2 100%  Physical Exam Vitals and nursing note reviewed.   72 year old female, resting comfortably and in no acute distress. Vital signs are significant for borderline elevated heart rate. Oxygen saturation is 100%, which is normal. Head is normocephalic and atraumatic. PERRLA,  EOMI. Oropharynx is clear. Neck is nontender and supple without adenopathy or JVD. Back is nontender and there is no CVA tenderness. Lungs are clear without rales, wheezes, or rhonchi. Chest is nontender. Heart has regular rate and rhythm without murmur. Abdomen is soft, flat, with moderate right lower quadrant tenderness and mild right upper quadrant tenderness.  Palpation on the left side of the abdomen causes referred pain in the right lower abdomen.  There is no rebound or guarding.  Peristalsis is hypoactive. Extremities have no cyanosis or edema, full range of motion is present.  AV fistula is present in the left upper arm with thrill present. Skin is warm and dry without rash. Neurologic: Mental status is normal,  cranial nerves are intact, moves all extremities equally.  ED Results / Procedures / Treatments   Labs (all labs ordered are listed, but only abnormal results are displayed) Labs Reviewed  CBC WITH DIFFERENTIAL/PLATELET - Abnormal; Notable for the following components:      Result Value   WBC 13.7 (*)    Neutro Abs 11.5 (*)    Abs Immature Granulocytes 0.08 (*)    All other components within normal limits  COMPREHENSIVE METABOLIC PANEL - Abnormal; Notable for the following components:   Sodium 134 (*)    Potassium 3.3 (*)    Chloride 91 (*)    Glucose, Bld 128 (*)    Creatinine, Ser 5.19 (*)    Albumin 3.4 (*)    GFR, Estimated 8 (*)    Anion gap 16 (*)    All other components within normal limits  LIPASE, BLOOD  MAGNESIUM    Radiology CT ABDOMEN PELVIS W CONTRAST  Result Date: 07/31/2022 CLINICAL DATA:  Right lower quadrant pain for several hours EXAM: CT ABDOMEN AND PELVIS WITH CONTRAST TECHNIQUE: Multidetector CT imaging of the abdomen and pelvis was performed using the standard protocol following bolus administration of intravenous contrast. RADIATION DOSE REDUCTION: This exam was performed according to the departmental dose-optimization program which includes automated exposure control, adjustment of the mA and/or kV according to patient size and/or use of iterative reconstruction technique. CONTRAST:  78m OMNIPAQUE IOHEXOL 350 MG/ML SOLN COMPARISON:  10/15/2020, ultrasound from 11/14/2020 FINDINGS: Lower chest: No acute abnormality. Hepatobiliary: Liver is within normal limits. Gallbladder is well distended. Dependent density is noted consistent with small gallstones. No biliary ductal dilatation is noted. Pancreas: Unremarkable. No pancreatic ductal dilatation or surrounding inflammatory changes. Spleen: Normal in size without focal abnormality. Adrenals/Urinary Tract: Adrenal glands are within normal limits. Kidneys are well visualized bilaterally and demonstrate heavy renal  vascular calcifications. Bilateral parapelvic cysts are noted stable in appearance from the prior exam. No follow-up is recommended. No definitive renal calculi are noted. The ureters are within normal limits. The bladder is decompressed. Stomach/Bowel: No obstructive or inflammatory changes of the colon are noted. The appendix is within normal limits. No inflammatory changes are seen. Small bowel and stomach are within normal limits. Vascular/Lymphatic: Aortic atherosclerosis. No enlarged abdominal or pelvic lymph nodes. Reproductive: Uterus has been surgically removed. There is a persistent right adnexal mass identified measuring 3.9 by 2.7 cm. This is decreased in size from the prior exam. This was characterized on prior ultrasound examination from 2022. Other: No abdominal wall hernia or abnormality. No abdominopelvic ascites. Musculoskeletal: No acute or significant osseous findings. IMPRESSION: Dependent gallstones without complicating factors. Persistent but decreased in size right adnexal mass with calcification. This has been previously characterized on ultrasound examination and surgical consultation recommended. No other focal  abnormality is noted. Electronically Signed   By: Inez Catalina M.D.   On: 07/31/2022 01:01    Procedures Procedures    Medications Ordered in ED Medications  morphine (PF) 4 MG/ML injection 4 mg (4 mg Intravenous Given 07/31/22 0101)  ondansetron (ZOFRAN) injection 4 mg (4 mg Intravenous Given 07/31/22 0102)  iohexol (OMNIPAQUE) 350 MG/ML injection 75 mL (75 mLs Intravenous Contrast Given 07/31/22 0032)    ED Course/ Medical Decision Making/ A&P                           Medical Decision Making Amount and/or Complexity of Data Reviewed Labs: ordered. Radiology: ordered.  Risk Prescription drug management.   Right lower quadrant pain.  Differential diagnosis includes, but is not limited to, appendicitis, diverticulitis, pancreatitis, cholecystitis, ischemic bowel,  abdominal aortic aneurysm.  I have reviewed and interpreted her laboratory tests and my interpretation is mild hyponatremia which is not clinically significant, mild hypokalemia, slightly increased anion gap which is probably related to her kidney disease and not her acute condition, mild hypoalbuminemia which is likely due to nutritional, mild leukocytosis with left shift which is nonspecific.  Lipase is normal.  I have ordered intravenous morphine and ondansetron and I have ordered a CT of abdomen and pelvis.  She does have history of prolonged QT interval, I have ordered a serum magnesium level and an electrocardiogram to evaluate her QT interval.  I have reviewed her past records, and CT of abdomen and pelvis on 10/16/2020 showed presence of cholelithiasis and aortic atherosclerosis.  CT scan shows no acute process.  Cholelithiasis still present without signs of cholecystitis.  I have independently viewed the images, and agree with the radiologist's interpretation.  Patient is reevaluated, she is sleeping comfortably.  I feel she is safe for discharge.  She is to go for her scheduled dialysis later today, return if symptoms are worsening.  Final Clinical Impression(s) / ED Diagnoses Final diagnoses:  RLQ abdominal pain  End-stage renal disease on hemodialysis ([place])  Hypokalemia  Hyponatremia    Rx / DC Orders ED Discharge Orders     None         Delora Fuel, MD 32/54/98 [phone]

## 2022-07-30 NOTE — ED Provider Triage Note (Signed)
Emergency Medicine Provider Triage Evaluation Note  Destiny Day , a 72 y.o. female  was evaluated in triage.  Pt complains of right lower quadrant abdominal pain onset noon today.  Denies history of similar symptoms.  Denies nausea, vomiting, fever, urinary symptoms.  Patient goes to dialysis on Monday, Wednesday, Friday with her last dialysis on Wednesday.  Review of Systems  Positive:  Negative:   Physical Exam  BP (!) 209/82 (BP Location: Right Arm)   Pulse 100   Temp 99 F (37.2 C) (Oral)   Resp 19   SpO2 98%  Gen:   Awake, no distress  Resp:  Normal effort  MSK:   Moves extremities without difficulty  Other:  Thrill noted to left fistula.  Generalized tenderness to palpation to abdomen.  Medical Decision Making  Medically screening exam initiated at 6:20 PM.  Appropriate orders placed.  [name] was informed that the remainder of the evaluation will be completed by another provider, this initial triage assessment does not replace that evaluation, and the importance of remaining in the ED until their evaluation is complete.     Demosthenes Virnig A, PA-C 07/30/22 1821

## 2022-07-31 ENCOUNTER — Emergency Department (HOSPITAL_COMMUNITY): Payer: Medicare Other

## 2022-07-31 DIAGNOSIS — N186 End stage renal disease: Secondary | ICD-10-CM | POA: Diagnosis not present

## 2022-07-31 DIAGNOSIS — N281 Cyst of kidney, acquired: Secondary | ICD-10-CM | POA: Diagnosis not present

## 2022-07-31 DIAGNOSIS — K8020 Calculus of gallbladder without cholecystitis without obstruction: Secondary | ICD-10-CM | POA: Diagnosis not present

## 2022-07-31 DIAGNOSIS — D631 Anemia in chronic kidney disease: Secondary | ICD-10-CM | POA: Diagnosis not present

## 2022-07-31 DIAGNOSIS — N2581 Secondary hyperparathyroidism of renal origin: Secondary | ICD-10-CM | POA: Diagnosis not present

## 2022-07-31 DIAGNOSIS — Z992 Dependence on renal dialysis: Secondary | ICD-10-CM | POA: Diagnosis not present

## 2022-07-31 MED ORDER — IOHEXOL 350 MG/ML SOLN
75.0000 mL | Freq: Once | INTRAVENOUS | Status: AC | PRN
  Administered 2022-07-31: 75 mL via INTRAVENOUS

## 2022-07-31 NOTE — Discharge Instructions (Addendum)
Your evaluation did not show any serious cause for pain.  Please take acetaminophen for pain if pain comes back.  If pain is severe, you are welcome to return at any time for reevaluation.  Please make sure to go for your dialysis later today.

## 2022-08-02 ENCOUNTER — Emergency Department (HOSPITAL_COMMUNITY): Payer: Medicare Other

## 2022-08-02 ENCOUNTER — Encounter (HOSPITAL_COMMUNITY): Payer: Self-pay | Admitting: Pharmacy Technician

## 2022-08-02 ENCOUNTER — Other Ambulatory Visit: Payer: Self-pay

## 2022-08-02 ENCOUNTER — Inpatient Hospital Stay (HOSPITAL_COMMUNITY)
Admission: EM | Admit: 2022-08-02 | Discharge: 2022-09-25 | DRG: 981 | Disposition: E | Payer: Medicare Other | Attending: Pulmonary Disease | Admitting: Pulmonary Disease

## 2022-08-02 DIAGNOSIS — E113599 Type 2 diabetes mellitus with proliferative diabetic retinopathy without macular edema, unspecified eye: Secondary | ICD-10-CM | POA: Diagnosis present

## 2022-08-02 DIAGNOSIS — T82898A Other specified complication of vascular prosthetic devices, implants and grafts, initial encounter: Secondary | ICD-10-CM | POA: Diagnosis present

## 2022-08-02 DIAGNOSIS — R197 Diarrhea, unspecified: Secondary | ICD-10-CM | POA: Diagnosis not present

## 2022-08-02 DIAGNOSIS — K449 Diaphragmatic hernia without obstruction or gangrene: Secondary | ICD-10-CM | POA: Diagnosis present

## 2022-08-02 DIAGNOSIS — Z8701 Personal history of pneumonia (recurrent): Secondary | ICD-10-CM

## 2022-08-02 DIAGNOSIS — E041 Nontoxic single thyroid nodule: Secondary | ICD-10-CM | POA: Diagnosis present

## 2022-08-02 DIAGNOSIS — R1084 Generalized abdominal pain: Secondary | ICD-10-CM | POA: Diagnosis not present

## 2022-08-02 DIAGNOSIS — I70292 Other atherosclerosis of native arteries of extremities, left leg: Secondary | ICD-10-CM | POA: Diagnosis present

## 2022-08-02 DIAGNOSIS — R0602 Shortness of breath: Secondary | ICD-10-CM | POA: Diagnosis not present

## 2022-08-02 DIAGNOSIS — R32 Unspecified urinary incontinence: Secondary | ICD-10-CM | POA: Diagnosis not present

## 2022-08-02 DIAGNOSIS — J150 Pneumonia due to Klebsiella pneumoniae: Secondary | ICD-10-CM | POA: Diagnosis present

## 2022-08-02 DIAGNOSIS — E876 Hypokalemia: Secondary | ICD-10-CM | POA: Diagnosis present

## 2022-08-02 DIAGNOSIS — Z89432 Acquired absence of left foot: Secondary | ICD-10-CM

## 2022-08-02 DIAGNOSIS — R5381 Other malaise: Secondary | ICD-10-CM | POA: Diagnosis not present

## 2022-08-02 DIAGNOSIS — M79606 Pain in leg, unspecified: Secondary | ICD-10-CM | POA: Diagnosis not present

## 2022-08-02 DIAGNOSIS — D34 Benign neoplasm of thyroid gland: Secondary | ICD-10-CM | POA: Diagnosis present

## 2022-08-02 DIAGNOSIS — N739 Female pelvic inflammatory disease, unspecified: Secondary | ICD-10-CM | POA: Diagnosis not present

## 2022-08-02 DIAGNOSIS — I120 Hypertensive chronic kidney disease with stage 5 chronic kidney disease or end stage renal disease: Secondary | ICD-10-CM | POA: Diagnosis not present

## 2022-08-02 DIAGNOSIS — M810 Age-related osteoporosis without current pathological fracture: Secondary | ICD-10-CM | POA: Diagnosis present

## 2022-08-02 DIAGNOSIS — Z800 Family history of malignant neoplasm of digestive organs: Secondary | ICD-10-CM

## 2022-08-02 DIAGNOSIS — R64 Cachexia: Secondary | ICD-10-CM | POA: Diagnosis present

## 2022-08-02 DIAGNOSIS — N839 Noninflammatory disorder of ovary, fallopian tube and broad ligament, unspecified: Secondary | ICD-10-CM | POA: Diagnosis present

## 2022-08-02 DIAGNOSIS — T3695XA Adverse effect of unspecified systemic antibiotic, initial encounter: Secondary | ICD-10-CM | POA: Diagnosis not present

## 2022-08-02 DIAGNOSIS — J9690 Respiratory failure, unspecified, unspecified whether with hypoxia or hypercapnia: Secondary | ICD-10-CM | POA: Diagnosis not present

## 2022-08-02 DIAGNOSIS — R1011 Right upper quadrant pain: Secondary | ICD-10-CM | POA: Diagnosis not present

## 2022-08-02 DIAGNOSIS — Z7984 Long term (current) use of oral hypoglycemic drugs: Secondary | ICD-10-CM

## 2022-08-02 DIAGNOSIS — A419 Sepsis, unspecified organism: Secondary | ICD-10-CM | POA: Diagnosis not present

## 2022-08-02 DIAGNOSIS — E871 Hypo-osmolality and hyponatremia: Secondary | ICD-10-CM | POA: Diagnosis present

## 2022-08-02 DIAGNOSIS — Z794 Long term (current) use of insulin: Secondary | ICD-10-CM

## 2022-08-02 DIAGNOSIS — R109 Unspecified abdominal pain: Principal | ICD-10-CM

## 2022-08-02 DIAGNOSIS — I739 Peripheral vascular disease, unspecified: Secondary | ICD-10-CM | POA: Diagnosis not present

## 2022-08-02 DIAGNOSIS — E43 Unspecified severe protein-calorie malnutrition: Secondary | ICD-10-CM | POA: Diagnosis present

## 2022-08-02 DIAGNOSIS — I953 Hypotension of hemodialysis: Secondary | ICD-10-CM | POA: Diagnosis not present

## 2022-08-02 DIAGNOSIS — K651 Peritoneal abscess: Secondary | ICD-10-CM

## 2022-08-02 DIAGNOSIS — Z515 Encounter for palliative care: Secondary | ICD-10-CM | POA: Diagnosis not present

## 2022-08-02 DIAGNOSIS — Z87891 Personal history of nicotine dependence: Secondary | ICD-10-CM | POA: Diagnosis not present

## 2022-08-02 DIAGNOSIS — N281 Cyst of kidney, acquired: Secondary | ICD-10-CM | POA: Diagnosis not present

## 2022-08-02 DIAGNOSIS — D72829 Elevated white blood cell count, unspecified: Secondary | ICD-10-CM

## 2022-08-02 DIAGNOSIS — Z89421 Acquired absence of other right toe(s): Secondary | ICD-10-CM

## 2022-08-02 DIAGNOSIS — I998 Other disorder of circulatory system: Secondary | ICD-10-CM | POA: Diagnosis not present

## 2022-08-02 DIAGNOSIS — I6782 Cerebral ischemia: Secondary | ICD-10-CM | POA: Diagnosis not present

## 2022-08-02 DIAGNOSIS — I10 Essential (primary) hypertension: Secondary | ICD-10-CM | POA: Diagnosis not present

## 2022-08-02 DIAGNOSIS — Z992 Dependence on renal dialysis: Secondary | ICD-10-CM

## 2022-08-02 DIAGNOSIS — I70223 Atherosclerosis of native arteries of extremities with rest pain, bilateral legs: Secondary | ICD-10-CM | POA: Diagnosis not present

## 2022-08-02 DIAGNOSIS — E8721 Acute metabolic acidosis: Secondary | ICD-10-CM | POA: Diagnosis not present

## 2022-08-02 DIAGNOSIS — J449 Chronic obstructive pulmonary disease, unspecified: Secondary | ICD-10-CM | POA: Diagnosis present

## 2022-08-02 DIAGNOSIS — N2581 Secondary hyperparathyroidism of renal origin: Secondary | ICD-10-CM | POA: Diagnosis present

## 2022-08-02 DIAGNOSIS — Z66 Do not resuscitate: Secondary | ICD-10-CM | POA: Diagnosis not present

## 2022-08-02 DIAGNOSIS — D75838 Other thrombocytosis: Secondary | ICD-10-CM | POA: Diagnosis not present

## 2022-08-02 DIAGNOSIS — I701 Atherosclerosis of renal artery: Secondary | ICD-10-CM | POA: Diagnosis not present

## 2022-08-02 DIAGNOSIS — N250 Renal osteodystrophy: Secondary | ICD-10-CM | POA: Diagnosis not present

## 2022-08-02 DIAGNOSIS — G9341 Metabolic encephalopathy: Secondary | ICD-10-CM | POA: Diagnosis not present

## 2022-08-02 DIAGNOSIS — I468 Cardiac arrest due to other underlying condition: Secondary | ICD-10-CM | POA: Diagnosis not present

## 2022-08-02 DIAGNOSIS — I509 Heart failure, unspecified: Secondary | ICD-10-CM | POA: Diagnosis not present

## 2022-08-02 DIAGNOSIS — K521 Toxic gastroenteritis and colitis: Secondary | ICD-10-CM | POA: Diagnosis not present

## 2022-08-02 DIAGNOSIS — K4090 Unilateral inguinal hernia, without obstruction or gangrene, not specified as recurrent: Secondary | ICD-10-CM | POA: Diagnosis not present

## 2022-08-02 DIAGNOSIS — Z89619 Acquired absence of unspecified leg above knee: Secondary | ICD-10-CM | POA: Diagnosis not present

## 2022-08-02 DIAGNOSIS — F419 Anxiety disorder, unspecified: Secondary | ICD-10-CM | POA: Diagnosis present

## 2022-08-02 DIAGNOSIS — G928 Other toxic encephalopathy: Secondary | ICD-10-CM | POA: Diagnosis not present

## 2022-08-02 DIAGNOSIS — I70202 Unspecified atherosclerosis of native arteries of extremities, left leg: Secondary | ICD-10-CM | POA: Diagnosis not present

## 2022-08-02 DIAGNOSIS — N289 Disorder of kidney and ureter, unspecified: Secondary | ICD-10-CM | POA: Diagnosis present

## 2022-08-02 DIAGNOSIS — Z7982 Long term (current) use of aspirin: Secondary | ICD-10-CM

## 2022-08-02 DIAGNOSIS — E440 Moderate protein-calorie malnutrition: Secondary | ICD-10-CM | POA: Insufficient documentation

## 2022-08-02 DIAGNOSIS — N186 End stage renal disease: Secondary | ICD-10-CM | POA: Diagnosis present

## 2022-08-02 DIAGNOSIS — N644 Mastodynia: Secondary | ICD-10-CM | POA: Diagnosis not present

## 2022-08-02 DIAGNOSIS — R112 Nausea with vomiting, unspecified: Secondary | ICD-10-CM | POA: Diagnosis not present

## 2022-08-02 DIAGNOSIS — K8000 Calculus of gallbladder with acute cholecystitis without obstruction: Secondary | ICD-10-CM | POA: Diagnosis present

## 2022-08-02 DIAGNOSIS — G253 Myoclonus: Secondary | ICD-10-CM | POA: Diagnosis not present

## 2022-08-02 DIAGNOSIS — K3533 Acute appendicitis with perforation and localized peritonitis, with abscess: Principal | ICD-10-CM | POA: Diagnosis present

## 2022-08-02 DIAGNOSIS — I70422 Atherosclerosis of autologous vein bypass graft(s) of the extremities with rest pain, left leg: Secondary | ICD-10-CM | POA: Diagnosis present

## 2022-08-02 DIAGNOSIS — K383 Fistula of appendix: Secondary | ICD-10-CM | POA: Diagnosis not present

## 2022-08-02 DIAGNOSIS — Z79899 Other long term (current) drug therapy: Secondary | ICD-10-CM

## 2022-08-02 DIAGNOSIS — I70222 Atherosclerosis of native arteries of extremities with rest pain, left leg: Secondary | ICD-10-CM | POA: Diagnosis not present

## 2022-08-02 DIAGNOSIS — Z9119 Patient's noncompliance with other medical treatment and regimen due to financial hardship: Secondary | ICD-10-CM

## 2022-08-02 DIAGNOSIS — K6819 Other retroperitoneal abscess: Secondary | ICD-10-CM | POA: Diagnosis present

## 2022-08-02 DIAGNOSIS — Y832 Surgical operation with anastomosis, bypass or graft as the cause of abnormal reaction of the patient, or of later complication, without mention of misadventure at the time of the procedure: Secondary | ICD-10-CM | POA: Diagnosis present

## 2022-08-02 DIAGNOSIS — J690 Pneumonitis due to inhalation of food and vomit: Secondary | ICD-10-CM | POA: Diagnosis not present

## 2022-08-02 DIAGNOSIS — R54 Age-related physical debility: Secondary | ICD-10-CM | POA: Diagnosis not present

## 2022-08-02 DIAGNOSIS — K219 Gastro-esophageal reflux disease without esophagitis: Secondary | ICD-10-CM | POA: Diagnosis present

## 2022-08-02 DIAGNOSIS — R739 Hyperglycemia, unspecified: Secondary | ICD-10-CM | POA: Diagnosis not present

## 2022-08-02 DIAGNOSIS — B9620 Unspecified Escherichia coli [E. coli] as the cause of diseases classified elsewhere: Secondary | ICD-10-CM | POA: Diagnosis present

## 2022-08-02 DIAGNOSIS — K8020 Calculus of gallbladder without cholecystitis without obstruction: Secondary | ICD-10-CM | POA: Diagnosis not present

## 2022-08-02 DIAGNOSIS — J9601 Acute respiratory failure with hypoxia: Secondary | ICD-10-CM | POA: Diagnosis not present

## 2022-08-02 DIAGNOSIS — D631 Anemia in chronic kidney disease: Secondary | ICD-10-CM | POA: Diagnosis present

## 2022-08-02 DIAGNOSIS — Z4682 Encounter for fitting and adjustment of non-vascular catheter: Secondary | ICD-10-CM | POA: Diagnosis not present

## 2022-08-02 DIAGNOSIS — R4182 Altered mental status, unspecified: Secondary | ICD-10-CM | POA: Diagnosis not present

## 2022-08-02 DIAGNOSIS — R627 Adult failure to thrive: Secondary | ICD-10-CM | POA: Diagnosis present

## 2022-08-02 DIAGNOSIS — E1122 Type 2 diabetes mellitus with diabetic chronic kidney disease: Secondary | ICD-10-CM | POA: Diagnosis not present

## 2022-08-02 DIAGNOSIS — Z91048 Other nonmedicinal substance allergy status: Secondary | ICD-10-CM

## 2022-08-02 DIAGNOSIS — Z681 Body mass index (BMI) 19 or less, adult: Secondary | ICD-10-CM

## 2022-08-02 DIAGNOSIS — R209 Unspecified disturbances of skin sensation: Secondary | ICD-10-CM | POA: Diagnosis not present

## 2022-08-02 DIAGNOSIS — Z978 Presence of other specified devices: Secondary | ICD-10-CM | POA: Diagnosis not present

## 2022-08-02 DIAGNOSIS — R6521 Severe sepsis with septic shock: Secondary | ICD-10-CM | POA: Diagnosis not present

## 2022-08-02 DIAGNOSIS — K829 Disease of gallbladder, unspecified: Secondary | ICD-10-CM

## 2022-08-02 DIAGNOSIS — I5042 Chronic combined systolic (congestive) and diastolic (congestive) heart failure: Secondary | ICD-10-CM | POA: Diagnosis present

## 2022-08-02 DIAGNOSIS — I132 Hypertensive heart and chronic kidney disease with heart failure and with stage 5 chronic kidney disease, or end stage renal disease: Secondary | ICD-10-CM | POA: Diagnosis present

## 2022-08-02 DIAGNOSIS — G4733 Obstructive sleep apnea (adult) (pediatric): Secondary | ICD-10-CM | POA: Diagnosis present

## 2022-08-02 DIAGNOSIS — F05 Delirium due to known physiological condition: Secondary | ICD-10-CM | POA: Diagnosis not present

## 2022-08-02 DIAGNOSIS — R932 Abnormal findings on diagnostic imaging of liver and biliary tract: Secondary | ICD-10-CM | POA: Diagnosis not present

## 2022-08-02 DIAGNOSIS — E1141 Type 2 diabetes mellitus with diabetic mononeuropathy: Secondary | ICD-10-CM | POA: Diagnosis present

## 2022-08-02 DIAGNOSIS — Z833 Family history of diabetes mellitus: Secondary | ICD-10-CM

## 2022-08-02 DIAGNOSIS — N2889 Other specified disorders of kidney and ureter: Secondary | ICD-10-CM | POA: Diagnosis present

## 2022-08-02 DIAGNOSIS — M009 Pyogenic arthritis, unspecified: Secondary | ICD-10-CM | POA: Diagnosis not present

## 2022-08-02 DIAGNOSIS — E1165 Type 2 diabetes mellitus with hyperglycemia: Secondary | ICD-10-CM | POA: Diagnosis present

## 2022-08-02 DIAGNOSIS — R918 Other nonspecific abnormal finding of lung field: Secondary | ICD-10-CM | POA: Diagnosis not present

## 2022-08-02 DIAGNOSIS — N261 Atrophy of kidney (terminal): Secondary | ICD-10-CM | POA: Diagnosis not present

## 2022-08-02 DIAGNOSIS — E1151 Type 2 diabetes mellitus with diabetic peripheral angiopathy without gangrene: Secondary | ICD-10-CM | POA: Diagnosis present

## 2022-08-02 DIAGNOSIS — Z7951 Long term (current) use of inhaled steroids: Secondary | ICD-10-CM

## 2022-08-02 DIAGNOSIS — L02211 Cutaneous abscess of abdominal wall: Secondary | ICD-10-CM | POA: Diagnosis not present

## 2022-08-02 DIAGNOSIS — K3532 Acute appendicitis with perforation and localized peritonitis, without abscess: Secondary | ICD-10-CM | POA: Diagnosis not present

## 2022-08-02 DIAGNOSIS — R410 Disorientation, unspecified: Secondary | ICD-10-CM | POA: Diagnosis not present

## 2022-08-02 DIAGNOSIS — K659 Peritonitis, unspecified: Secondary | ICD-10-CM | POA: Diagnosis present

## 2022-08-02 DIAGNOSIS — Z7189 Other specified counseling: Secondary | ICD-10-CM | POA: Diagnosis not present

## 2022-08-02 DIAGNOSIS — Z888 Allergy status to other drugs, medicaments and biological substances status: Secondary | ICD-10-CM

## 2022-08-02 DIAGNOSIS — E785 Hyperlipidemia, unspecified: Secondary | ICD-10-CM | POA: Diagnosis present

## 2022-08-02 DIAGNOSIS — K828 Other specified diseases of gallbladder: Secondary | ICD-10-CM | POA: Diagnosis not present

## 2022-08-02 DIAGNOSIS — R0902 Hypoxemia: Secondary | ICD-10-CM | POA: Diagnosis not present

## 2022-08-02 DIAGNOSIS — K5790 Diverticulosis of intestine, part unspecified, without perforation or abscess without bleeding: Secondary | ICD-10-CM | POA: Diagnosis present

## 2022-08-02 DIAGNOSIS — K632 Fistula of intestine: Secondary | ICD-10-CM | POA: Diagnosis not present

## 2022-08-02 DIAGNOSIS — Z90710 Acquired absence of both cervix and uterus: Secondary | ICD-10-CM

## 2022-08-02 DIAGNOSIS — Z0389 Encounter for observation for other suspected diseases and conditions ruled out: Secondary | ICD-10-CM | POA: Diagnosis not present

## 2022-08-02 DIAGNOSIS — N7093 Salpingitis and oophoritis, unspecified: Secondary | ICD-10-CM | POA: Diagnosis present

## 2022-08-02 DIAGNOSIS — N189 Chronic kidney disease, unspecified: Secondary | ICD-10-CM | POA: Diagnosis not present

## 2022-08-02 DIAGNOSIS — E739 Lactose intolerance, unspecified: Secondary | ICD-10-CM | POA: Diagnosis present

## 2022-08-02 DIAGNOSIS — I130 Hypertensive heart and chronic kidney disease with heart failure and stage 1 through stage 4 chronic kidney disease, or unspecified chronic kidney disease: Secondary | ICD-10-CM | POA: Diagnosis not present

## 2022-08-02 DIAGNOSIS — T40605A Adverse effect of unspecified narcotics, initial encounter: Secondary | ICD-10-CM | POA: Diagnosis not present

## 2022-08-02 LAB — COMPREHENSIVE METABOLIC PANEL
ALT: 9 U/L (ref 0–44)
AST: 19 U/L (ref 15–41)
Albumin: 2.7 g/dL — ABNORMAL LOW (ref 3.5–5.0)
Alkaline Phosphatase: 93 U/L (ref 38–126)
Anion gap: 16 — ABNORMAL HIGH (ref 5–15)
BUN: 21 mg/dL (ref 8–23)
CO2: 25 mmol/L (ref 22–32)
Calcium: 9.7 mg/dL (ref 8.9–10.3)
Chloride: 88 mmol/L — ABNORMAL LOW (ref 98–111)
Creatinine, Ser: 6.42 mg/dL — ABNORMAL HIGH (ref 0.44–1.00)
GFR, Estimated: 6 mL/min — ABNORMAL LOW (ref >60)
Glucose, Bld: 250 mg/dL — ABNORMAL HIGH (ref 70–99)
Potassium: 3.3 mmol/L — ABNORMAL LOW (ref 3.5–5.1)
Sodium: 129 mmol/L — ABNORMAL LOW (ref 135–145)
Total Bilirubin: 0.6 mg/dL (ref 0.3–1.2)
Total Protein: 6.3 g/dL — ABNORMAL LOW (ref 6.5–8.1)

## 2022-08-02 LAB — CBC WITH DIFFERENTIAL/PLATELET
Abs Immature Granulocytes: 0.21 10*3/uL — ABNORMAL HIGH (ref 0.00–0.07)
Basophils Absolute: 0 10*3/uL (ref 0.0–0.1)
Basophils Relative: 0 %
Eosinophils Absolute: 0.1 10*3/uL (ref 0.0–0.5)
Eosinophils Relative: 1 %
HCT: 36.5 % (ref 36.0–46.0)
Hemoglobin: 11.6 g/dL — ABNORMAL LOW (ref 12.0–15.0)
Immature Granulocytes: 1 %
Lymphocytes Relative: 4 %
Lymphs Abs: 0.9 10*3/uL (ref 0.7–4.0)
MCH: 31.8 pg (ref 26.0–34.0)
MCHC: 31.8 g/dL (ref 30.0–36.0)
MCV: 100 fL (ref 80.0–100.0)
Monocytes Absolute: 0.7 10*3/uL (ref 0.1–1.0)
Monocytes Relative: 4 %
Neutro Abs: 17.7 10*3/uL — ABNORMAL HIGH (ref 1.7–7.7)
Neutrophils Relative %: 90 %
Platelets: 243 10*3/uL (ref 150–400)
RBC: 3.65 MIL/uL — ABNORMAL LOW (ref 3.87–5.11)
RDW: 12.9 % (ref 11.5–15.5)
WBC: 19.6 10*3/uL — ABNORMAL HIGH (ref 4.0–10.5)
nRBC: 0 % (ref 0.0–0.2)

## 2022-08-02 LAB — LIPASE, BLOOD: Lipase: 32 U/L (ref 11–51)

## 2022-08-02 MED ORDER — ALBUTEROL SULFATE (2.5 MG/3ML) 0.083% IN NEBU: 2.5000 mg | INHALATION_SOLUTION | RESPIRATORY_TRACT | Status: DC | PRN

## 2022-08-02 MED ORDER — HYDROMORPHONE HCL 1 MG/ML IJ SOLN
0.5000 mg | INTRAMUSCULAR | Status: DC | PRN
  Administered 2022-08-03 – 2022-08-05 (×6): 0.5 mg via INTRAVENOUS
  Filled 2022-08-02 (×2): qty 1
  Filled 2022-08-02 (×2): qty 0.5
  Filled 2022-08-02: qty 1
  Filled 2022-08-02: qty 0.5

## 2022-08-02 MED ORDER — FENTANYL CITRATE PF 50 MCG/ML IJ SOSY
12.5000 ug | PREFILLED_SYRINGE | INTRAMUSCULAR | Status: DC | PRN
  Administered 2022-08-02 – 2022-08-06 (×4): 50 ug via INTRAVENOUS
  Filled 2022-08-02 (×4): qty 1

## 2022-08-02 MED ORDER — IOHEXOL 350 MG/ML SOLN
75.0000 mL | Freq: Once | INTRAVENOUS | Status: AC | PRN
  Administered 2022-08-02: 75 mL via INTRAVENOUS

## 2022-08-02 MED ORDER — FLUTICASONE FUROATE-VILANTEROL 200-25 MCG/ACT IN AEPB
1.0000 | INHALATION_SPRAY | Freq: Every day | RESPIRATORY_TRACT | Status: DC
  Administered 2022-08-03 – 2022-08-27 (×16): 1 via RESPIRATORY_TRACT
  Filled 2022-08-02 (×2): qty 28

## 2022-08-02 MED ORDER — OXYCODONE HCL 5 MG PO TABS
5.0000 mg | ORAL_TABLET | ORAL | Status: DC | PRN
  Administered 2022-08-04 – 2022-08-21 (×16): 5 mg via ORAL
  Filled 2022-08-02 (×16): qty 1

## 2022-08-02 MED ORDER — INSULIN ASPART 100 UNIT/ML IJ SOLN
0.0000 [IU] | Freq: Three times a day (TID) | INTRAMUSCULAR | Status: DC
  Administered 2022-08-03: 1 [IU] via SUBCUTANEOUS
  Administered 2022-08-04: 2 [IU] via SUBCUTANEOUS
  Administered 2022-08-06 – 2022-08-07 (×4): 1 [IU] via SUBCUTANEOUS
  Administered 2022-08-07: 3 [IU] via SUBCUTANEOUS
  Administered 2022-08-08 (×3): 2 [IU] via SUBCUTANEOUS
  Administered 2022-08-09: 1 [IU] via SUBCUTANEOUS
  Administered 2022-08-09: 3 [IU] via SUBCUTANEOUS
  Administered 2022-08-11 (×3): 1 [IU] via SUBCUTANEOUS
  Administered 2022-08-12: 2 [IU] via SUBCUTANEOUS
  Administered 2022-08-13 – 2022-08-14 (×2): 1 [IU] via SUBCUTANEOUS
  Administered 2022-08-14: 2 [IU] via SUBCUTANEOUS
  Administered 2022-08-15: 3 [IU] via SUBCUTANEOUS
  Administered 2022-08-15 – 2022-08-16 (×2): 1 [IU] via SUBCUTANEOUS
  Administered 2022-08-16: 2 [IU] via SUBCUTANEOUS
  Administered 2022-08-16 – 2022-08-18 (×3): 1 [IU] via SUBCUTANEOUS
  Administered 2022-08-18: 2 [IU] via SUBCUTANEOUS
  Administered 2022-08-19 – 2022-08-20 (×4): 1 [IU] via SUBCUTANEOUS
  Administered 2022-08-20: 2 [IU] via SUBCUTANEOUS
  Administered 2022-08-21 – 2022-08-22 (×3): 1 [IU] via SUBCUTANEOUS
  Administered 2022-08-22 (×2): 2 [IU] via SUBCUTANEOUS
  Administered 2022-08-23 – 2022-08-25 (×5): 1 [IU] via SUBCUTANEOUS
  Administered 2022-08-27: 2 [IU] via SUBCUTANEOUS

## 2022-08-02 MED ORDER — PANTOPRAZOLE SODIUM 40 MG IV SOLR
40.0000 mg | Freq: Two times a day (BID) | INTRAVENOUS | Status: DC
  Administered 2022-08-02 – 2022-08-08 (×12): 40 mg via INTRAVENOUS
  Filled 2022-08-02 (×12): qty 10

## 2022-08-02 MED ORDER — HYDROMORPHONE HCL 1 MG/ML IJ SOLN
0.5000 mg | Freq: Once | INTRAMUSCULAR | Status: AC
  Administered 2022-08-02: 0.5 mg via INTRAVENOUS
  Filled 2022-08-02 (×2): qty 1

## 2022-08-02 MED ORDER — PIPERACILLIN-TAZOBACTAM IN DEX 2-0.25 GM/50ML IV SOLN
2.2500 g | Freq: Three times a day (TID) | INTRAVENOUS | Status: DC
  Administered 2022-08-03 – 2022-08-08 (×15): 2.25 g via INTRAVENOUS
  Filled 2022-08-02 (×20): qty 50

## 2022-08-02 MED ORDER — SODIUM CHLORIDE 0.9 % IV SOLN: INTRAVENOUS | Status: DC

## 2022-08-02 MED ORDER — ACETAMINOPHEN 10 MG/ML IV SOLN: 1000.0000 mg | Freq: Three times a day (TID) | INTRAVENOUS | Status: AC | PRN

## 2022-08-02 MED ORDER — METOPROLOL TARTRATE 5 MG/5ML IV SOLN
2.5000 mg | Freq: Three times a day (TID) | INTRAVENOUS | Status: DC
  Administered 2022-08-02 – 2022-08-03 (×2): 2.5 mg via INTRAVENOUS
  Filled 2022-08-02 (×2): qty 5

## 2022-08-02 MED ORDER — FLUTICASONE PROPIONATE 50 MCG/ACT NA SUSP: 2.0000 | Freq: Every day | NASAL | Status: DC | PRN

## 2022-08-02 MED ORDER — OXYCODONE HCL 5 MG PO TABS
10.0000 mg | ORAL_TABLET | ORAL | Status: DC | PRN
  Administered 2022-08-03 – 2022-08-06 (×3): 10 mg via ORAL
  Filled 2022-08-02 (×3): qty 2

## 2022-08-02 MED ORDER — PIPERACILLIN-TAZOBACTAM 3.375 G IVPB 30 MIN
3.3750 g | Freq: Once | INTRAVENOUS | Status: AC
  Administered 2022-08-02: 3.375 g via INTRAVENOUS
  Filled 2022-08-02: qty 50

## 2022-08-02 MED ORDER — OFLOXACIN 0.3 % OP SOLN: 1.0000 [drp] | Freq: Four times a day (QID) | OPHTHALMIC | Status: DC

## 2022-08-02 NOTE — ED Provider Notes (Signed)
[facility] Provider Note   CSN: [id number] Arrival date & time: 08/10/2022  1717     History  Chief Complaint  Patient presents with   Abdominal Pain    Destiny Day is a 72 y.o. female.  HPI 72 year old female with multiple comorbidities including CHF, diabetes, ESRD on dialysis, hypertension presents with right-sided abdominal pain.  This pain has been there for about a week according to her.  She was seen in the ER a few days ago and sent home.  She states the pain has persisted and gotten worse.  It is a constant pain.  She has not eaten much, partially due to no appetite and pain and partially due to some occasional vomiting.  No fevers.  There is also some right-sided back pain. No dyspnea/chest pain.  Home Medications Prior to Admission medications   Medication Sig Start Date End Date Taking? Authorizing Provider  albuterol (VENTOLIN HFA) 108 (90 Base) MCG/ACT inhaler Inhale 2 puffs into the lungs every 4 (four) hours as needed for wheezing or shortness of breath. 07/21/22   Piontek, Junie Panning, MD  amLODipine (NORVASC) 10 MG tablet Take 10 mg by mouth daily. 02/09/22   [provider]  aspirin EC 81 MG tablet Take 81 mg by mouth daily.    [provider]  atorvastatin (LIPITOR) 40 MG tablet Take 40 mg by mouth at bedtime. 07/17/21   [provider]  b complex-vitamin c-folic acid (NEPHRO-VITE) 0.8 MG TABS tablet Take 1 tablet by mouth at bedtime.    [provider]  budesonide-formoterol (SYMBICORT) 160-4.5 MCG/ACT inhaler Inhale 2 puffs into the lungs 2 (two) times daily. Patient not taking: Reported on 05/28/2022 04/07/22   Valentina Shaggy, MD  busPIRone (BUSPAR) 7.5 MG tablet Take 7.5 mg by mouth daily as needed (anxiety). 10/29/19   [provider]  carvedilol (COREG) 12.5 MG tablet Take 12.5 mg by mouth See admin instructions. Take 1 tablet (12.5 mg) by mouth once daily in the evening on  Mondays, Wednesdays, & Fridays. (Dialysis) Take 1 tablet (12.5 mg) by mouth twice daily on Sundays, Tuesdays, Thursdays, & Saturdays. (Non Dialysis)    [provider]  cinacalcet (SENSIPAR) 60 MG tablet Take 60 mg by mouth See admin instructions. Take 1 tablet (60 mg) by mouth in the evening on Mondays, Wednesdays, & Fridays (dialysis) Take 1 tablet (60 mg) by mouth twice daily on Sundays, Tuesdays, Thursdays, Saturdays. (non dialysis) 11/28/18   [provider]  diphenhydrAMINE HCl (BENADRYL ALLERGY PO) Take 1 capsule by mouth every Monday, Wednesday, and Friday with hemodialysis. If needed to help with allergies    [provider]  Doxercalciferol (HECTOROL IV) Doxercalciferol (Hectorol) 08/06/21 08/05/22  [provider]  EPINEPHrine (AUVI-Q) 0.3 mg/0.3 mL IJ SOAJ injection Inject 0.3 mg into the muscle as needed for anaphylaxis. 12/24/20   Valentina Shaggy, MD  ferric citrate (AURYXIA) 1 GM 210 MG(Fe) tablet Take 210-420 mg by mouth See admin instructions. Take 2 tablets (420 mg) by mouth with each meal & take 1 tablet (210 mg) by mouth with snacks.    [provider]  fluticasone (FLONASE) 50 MCG/ACT nasal spray Place 2 sprays into both nostrils daily as needed for allergies or rhinitis. 04/07/22   Valentina Shaggy, MD  fluticasone-salmeterol (ADVAIR [facility]) [phone] MCG/ACT inhaler Inhale two puffs twice daily Patient not taking: Reported on 05/28/2022 12/24/20   Valentina Shaggy, MD  gabapentin (NEURONTIN) 100 MG capsule TAKE 1  CAPSULE (100 MG TOTAL) BY MOUTH AT BEDTIME. WHEN NECESSARY FOR NEUROPATHY PAIN Patient taking differently: Take 100 mg by mouth at bedtime as needed (When necessary for neuropathy pain). 12/13/18   Newt Minion, MD  glipiZIDE (GLUCOTROL) 5 MG tablet Take 5 mg by mouth at bedtime.    [provider]  guaiFENesin-codeine 100-10 MG/5ML syrup Take 5 mLs by mouth 3 (three) times daily as needed for cough. 07/08/21    Valentina Shaggy, MD  isosorbide-hydrALAZINE (BIDIL) 20-37.5 MG tablet Take 1 tablet by mouth See admin instructions. hs on Monday,Wednesday,Friday (dialysis days) Tid on Tuesday,Thursday,Saturday,Sunday(non-dialysis days) Patient taking differently: Take 1 tablet by mouth See admin instructions. Take in the evening on Monday,Wednesday,Friday (dialysis days) BID on Tuesday,Thursday,Saturday,Sunday(non-dialysis days) 01/08/22   Cantwell, Celeste C, PA-C  latanoprost (XALATAN) 0.005 % ophthalmic solution Place 1 drop into the left eye at bedtime. Patient not taking: Reported on 05/28/2022 11/25/21 11/25/22  Rankin, Clent Demark, MD  levocetirizine (XYZAL) 5 MG tablet Take 1 tablet (5 mg total) by mouth every evening. 04/07/22   Valentina Shaggy, MD  lidocaine (LMX) 4 % cream Apply 1 Application topically every Monday, Wednesday, and Friday with hemodialysis. 05/22/22   [provider]  lidocaine-prilocaine (EMLA) cream Apply 1 Application topically as needed. Patient not taking: Reported on 05/28/2022 03/31/22   Vanessa Kick, MD  loperamide (IMODIUM) 2 MG capsule Take 2 capsules (4 mg total) by mouth 3 (three) times daily as needed for diarrhea or loose stools. 08/04/21   Bonnielee Haff, MD  Methoxy PEG-Epoetin Beta (MIRCERA IJ) Mircera 08/20/21 10/23/22  [provider]  montelukast (SINGULAIR) 10 MG tablet TAKE 1 TABLET BY MOUTH EVERYDAY AT BEDTIME 04/07/22   Valentina Shaggy, MD  ofloxacin (OCUFLOX) 0.3 % ophthalmic solution Place 1 drop into the right eye 4 (four) times daily. 05/27/22   [provider]  omeprazole (PRILOSEC) 20 MG capsule Take 20 mg by mouth daily as needed.    [provider]  ondansetron (ZOFRAN) 4 MG tablet Take 1 tablet (4 mg total) by mouth every 6 (six) hours as needed for nausea. 05/22/19   Aline August, MD  polyethylene glycol (MIRALAX / GLYCOLAX) 17 g packet Take 17 g by mouth daily. Patient taking differently: Take 17 g by mouth daily  as needed for mild constipation. 02/04/20   Wieters, Hallie C, PA-C  prednisoLONE acetate (PRED FORTE) 1 % ophthalmic suspension Place 1 drop into the right eye 4 (four) times daily. 05/27/22   [provider]  promethazine-dextromethorphan (PROMETHAZINE-DM) 6.25-15 MG/5ML syrup Take 5 mLs by mouth 4 (four) times daily as needed for cough. 07/21/22   Rondel Oh, MD  Spacer/Aero-Holding Chambers (AEROCHAMBER PLUS) inhaler Use with inhaler 03/09/21   Melynda Ripple, MD  traZODone (DESYREL) 50 MG tablet Take 25-50 mg by mouth at bedtime as needed for sleep. 10/29/19   [provider]      Allergies    Adhesive  [tape], Lisinopril, and Prednisone    Review of Systems   Review of Systems  Constitutional:  Negative for fever.  Respiratory:  Negative for shortness of breath.   Cardiovascular:  Negative for chest pain.  Gastrointestinal:  Positive for abdominal pain and vomiting.  Musculoskeletal:  Positive for back pain.    Physical Exam Updated Vital Signs BP (!) 131/59 (BP Location: Right Arm)   Pulse (!) 103   Temp 98.2 F (36.8 C)   Resp (!) 24   SpO2 94%  Physical Exam Vitals and  nursing note reviewed.  Constitutional:      Appearance: She is well-developed. She is not diaphoretic.  HENT:     Head: Normocephalic and atraumatic.  Pulmonary:     Effort: Pulmonary effort is normal.  Abdominal:     Palpations: Abdomen is soft.     Tenderness: There is abdominal tenderness in the right upper quadrant and right lower quadrant.  Skin:    General: Skin is warm and dry.  Neurological:     Mental Status: She is alert.     ED Results / Procedures / Treatments   Labs (all labs ordered are listed, but only abnormal results are displayed) Labs Reviewed  CBC WITH DIFFERENTIAL/PLATELET - Abnormal; Notable for the following components:      Result Value   WBC 19.6 (*)    RBC 3.65 (*)    Hemoglobin 11.6 (*)    Neutro Abs 17.7 (*)    Abs Immature Granulocytes 0.21  (*)    All other components within normal limits  COMPREHENSIVE METABOLIC PANEL - Abnormal; Notable for the following components:   Sodium 129 (*)    Potassium 3.3 (*)    Chloride 88 (*)    Glucose, Bld 250 (*)    Creatinine, Ser 6.42 (*)    Total Protein 6.3 (*)    Albumin 2.7 (*)    GFR, Estimated 6 (*)    Anion gap 16 (*)    All other components within normal limits  LIPASE, BLOOD  LACTIC ACID, PLASMA  LACTIC ACID, PLASMA  PROCALCITONIN  PROCALCITONIN  C-REACTIVE PROTEIN  BRAIN NATRIURETIC PEPTIDE  HEMOGLOBIN A1C    EKG None  Radiology CT ABDOMEN PELVIS W CONTRAST  Result Date: 07/31/2022 CLINICAL DATA:  Right groin and right abdominal pain. EXAM: CT ABDOMEN AND PELVIS WITH CONTRAST TECHNIQUE: Multidetector CT imaging of the abdomen and pelvis was performed using the standard protocol following bolus administration of intravenous contrast. RADIATION DOSE REDUCTION: This exam was performed according to the departmental dose-optimization program which includes automated exposure control, adjustment of the mA and/or kV according to patient size and/or use of iterative reconstruction technique. CONTRAST:  61m OMNIPAQUE IOHEXOL 350 MG/ML SOLN COMPARISON:  07/31/2022 FINDINGS: Lower chest: No acute abnormality. Hepatobiliary: Cholelithiasis without pericholecystic inflammatory change noted. The liver is unremarkable. No intra or extrahepatic biliary ductal dilation. Pancreas: Unremarkable Spleen: Unremarkable Adrenals/Urinary Tract: The adrenal glands are unremarkable. The kidneys are normal in position. There is mild to moderate renal cortical atrophy bilaterally. Multiple simple cortical cysts are seen within the kidneys bilaterally. Within the upper pole of the left kidney, however, a hypoenhancing 2.1 cm lesion is identified suspicious for AA primary renal neoplasm. Vascular calcifications noted within the renal hila bilaterally. No definite urinary renal or ureteral calculi. No  hydronephrosis. The bladder is unremarkable. Stomach/Bowel: The stomach, small bowel, and large bowel are unremarkable. The previously noted loculated collection along the right pelvic sidewall is again identified and is enlarged, measuring 3.9 x 5.3 cm at axial image # 69/3. As noted on prior examination, there is irregular mural enhancement, enhancing internal septa, and mild surrounding inflammatory stranding. Additionally, on the current examination, there has developed gas within the superior aspect of the mass, best seen on axial image # 68/3. When compared to remote prior examination of 10/15/2020, this is seen immediately adjacent to the appendix and complex right adnexal cystic lesion. Altogether, the findings are suspicious for a perforated appendicitis with a periappendiceal abscess with secondary involvement of the previously noted adnexal  mass or a tubo-ovarian abscess. A malignant lesion is considered less likely given its relatively rapid evolution though a secondarily infected malignancy could appear in this fashion. No free intraperitoneal gas or fluid. No evidence of obstruction. Vascular/Lymphatic: Extensive aortoiliac atherosclerotic calcification. Extensive visceral arteriosclerosis. 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 No pathologic adenopathy within the abdomen and pelvis. Reproductive: Uterus absent.  No adnexal masses on the left. Other: Small right femoral hernia is again identified though bowel within the hernia sac noted on prior examination has spontaneously reduced in the interval. Musculoskeletal: Degenerative changes are seen within the lumbar spine. No acute bone abnormality. No lytic or blastic bone lesion. IMPRESSION: 1. Interval enlargement of the  previously noted loculated collection along the right pelvic sidewall with irregular mural enhancement, enhancing internal septa, and mild surrounding inflammatory stranding. Additionally, there has developed gas within the superior aspect of the mass, best seen on axial image # 68/3. When compared to remote prior examination of 10/15/2020, this is seen immediately adjacent to the appendix and complex right adnexal cystic lesion. Altogether, the findings are suspicious for a perforated appendicitis with a periappendiceal abscess with probable secondary involvement of the previously noted adnexal mass or a tubo-ovarian abscess. A malignant lesion is considered less likely given its relatively rapid evolution though a secondarily infected malignancy could appear in this fashion. Surgical consultation is recommended. 2. 2.1 cm hypoenhancing lesion within the upper pole of the left kidney suspicious for a primary renal neoplasm. Dedicated renal mass protocol CT or MRI examination is recommended for evaluation once the patient's acute issues have resolved. 3. Cholelithiasis. 4. Small right fat containing femoral hernia. Previously noted bowel within the hernia sac has spontaneously reduced. Aortic Atherosclerosis (ICD10-I70.0). Electronically Signed   By: Fidela Salisbury M.D.   On: 08/17/2022 20:35   US Abdomen Limited RUQ (LIVER/GB)  Result Date: 08/23/2022 CLINICAL DATA:  Right-sided abdominal pain EXAM: ULTRASOUND ABDOMEN LIMITED RIGHT UPPER QUADRANT COMPARISON:  CT abdomen and pelvis 07/31/2022 FINDINGS: Gallbladder: Small gallstones are present. There is no gallbladder wall thickening or pericholecystic fluid. Sonographic Percell Miller sign is positive per sonographer. Common bile duct: Diameter: 5.1 mm Liver: No focal lesion identified. Increase in parenchymal echogenicity. Portal vein is patent on color Doppler imaging with normal direction of blood flow towards the liver. Other: 1.4 x 2.2 x 1.4 cm right renal cyst.  Right kidney appears echogenic. IMPRESSION: 1. Cholelithiasis with positive sonographic Murphy sign. No gallbladder wall thickening or pericholecystic fluid. Findings are equivocal for acute cholecystitis. 2. Increased echogenicity of the liver. This is a nonspecific finding but may be seen in the setting of hepatic steatosis. 3. Echogenic right kidney, suggesting medical renal disease. Electronically Signed   By: Ronney Asters M.D.   On: 07/28/2022 18:35    Procedures Ultrasound ED Peripheral IV (Provider)  Date/Time: 08/22/2022 7:51 PM  Performed by: Sherwood Gambler, MD Authorized by: Sherwood Gambler, MD   Procedure details:    Indications: poor IV access     Skin Prep: chlorhexidine gluconate     Location:  Right AC   Angiocath:  20 G   Bedside Ultrasound Guided: Yes     Patient tolerated procedure without complications: Yes     Dressing applied: Yes       Medications Ordered in ED Medications  oxyCODONE (Oxy IR/ROXICODONE) immediate release  tablet 5 mg (has no administration in time range)  oxyCODONE (Oxy IR/ROXICODONE) immediate release tablet 10 mg (has no administration in time range)  HYDROmorphone (DILAUDID) injection 0.5 mg (has no administration in time range)  0.9 %  sodium chloride infusion (has no administration in time range)  insulin aspart (novoLOG) injection 0-6 Units (has no administration in time range)  HYDROmorphone (DILAUDID) injection 0.5 mg (0.5 mg Intravenous Given 07/30/2022 1951)  piperacillin-tazobactam (ZOSYN) IVPB 3.375 g (3.375 g Intravenous New Bag/Given 08/01/2022 2019)  iohexol (OMNIPAQUE) 350 MG/ML injection 75 mL (75 mLs Intravenous Contrast Given 08/01/2022 2007)    ED Course/ Medical Decision Making/ A&P                           Medical Decision Making Amount and/or Complexity of Data Reviewed Labs:     Details: Leukocytosis of 19 is higher than a few days ago. Radiology: independent interpretation performed.    Details: Right upper quadrant  ultrasound with gallstones but no pericholecystic fluid. CT with right lower quadrant fluid collection with some gas  Risk Prescription drug management. Decision regarding hospitalization.   Overall, patient has significant right upper and lower abdominal tenderness.  I discussed with general surgery at first due to concern for possible cholecystitis.  However on his read of the original CT from a few days ago, patient seem to have a right femoral hernia with some bowel.  Thus he wants a repeat CT.  The femoral hernia no longer has bowel but now there is concern for appendicitis with abscess according to radiology.  I discussed this with general surgery Dr. Dema Severin.  At this point she needs to be covered with Zosyn but no emergent surgery tonight and this will be sorted out better tomorrow.  Will need hospitalist admission and I have discussed with Dr. Marcello Moores.        Final Clinical Impression(s) / ED Diagnoses Final diagnoses:  Right sided abdominal pain    Rx / DC Orders ED Discharge Orders     None         Sherwood Gambler, MD 08/08/2022 2204

## 2022-08-02 NOTE — H&P (Incomplete)
History and Physical    Destiny Day JGG:[id number] DOB: 06-Jun-1951 DOA: 08/03/2022  PCP: Cipriano Mile, NP  Patient coming from: home  I have personally briefly reviewed patient's old medical records in [place]  Chief Complaint: righ side lower abd pain hour PTA  HPI: Destiny Day is a 72 y.o. female with medical history significant of  hypertension, hyperlipidemia, HFpEF, diabetes mellitus type 2, asthma/COPD, and ESRD on HD(M/W/F) ,anxiety,GERD, OSA not cpap due to prohibitive cost,patient presents to Ed with complaint of right lower quadrant abdominal pain that began hours PTA. Per patient she notes abdominal pain actually started a week ago but has been intermittent however today was very severe and is associated with low appetite, nausea but no emesis/diarrhea/chest pain [name] /fever/ chills or presyncope.   ED Course:  Patient was seen by surgery who base on CT imaging is concerned for possible right inguina l hernia /vs gallbladder disease and recommended HIDA scan for further evaluation/ vs  ct findings that  are also suspicious for a perforated appendicitis with a periappendiceal abscess with probable secondary involvement of the previously noted adnexal mass or a tubo-ovarian abscess. Surgery Dr Dema Severin will f/u with patient per dr Thermon Leyland off going Surgeon Dr At time recommendation is for iv antibiotics while surgery evaluates further surgical plan.   Vitals: afeb, 131/59, hr 103, rr 24 Labs Wbc 19.6, hgb 11.6 (13.6), plt 243, pmn 17.7 left shift,  N a 129, K 3.3 , CL 88, glu 250, cr 6.42 AG 16 lipase 32 spo94   US/gallbladder IMPRESSION: 1. Cholelithiasis with positive sonographic Murphy sign. No gallbladder wall thickening or pericholecystic fluid. Findings are equivocal for acute cholecystitis. 2. Increased echogenicity of the liver. This is a nonspecific finding but may be seen in the setting of hepatic steatosis. 3. Echogenic right kidney,  suggesting medical renal disease.    CT pelvis/ab IMPRESSION: 1. Interval enlargement of the previously noted loculated collection along the right pelvic sidewall with irregular mural enhancement, enhancing internal septa, and mild surrounding inflammatory stranding. Additionally, there has developed gas within the superior aspect of the mass, best seen on axial image # 68/3. When compared to remote prior examination of 10/15/2020, this is seen immediately adjacent to the appendix and complex right adnexal cystic lesion. Altogether, the findings are suspicious for a perforated appendicitis with a periappendiceal abscess with probable secondary involvement of the previously noted adnexal mass or a tubo-ovarian abscess. A malignant lesion is considered less likely given its relatively rapid evolution though a secondarily infected malignancy could appear in this fashion. Surgical consultation is recommended. 2. 2.1 cm hypoenhancing lesion within the upper pole of the left kidney suspicious for a primary renal neoplasm. Dedicated renal mass protocol CT or MRI examination is recommended for evaluation once the patient's acute issues have resolved. 3. Cholelithiasis. 4. Small right fat containing femoral hernia. Previously noted bowel within the hernia sac has spontaneously reduced.   Aortic Atherosclerosis (ICD10-I70.0  Tx dialuid 0.5, zosyn Review of Systems: As per HPI otherwise 10 point review of systems negative.   Past Medical History:  Diagnosis Date   Abdominal bruit    Anxiety    Arthritis    Osteoarthritis   Asthma    Cervical disc disease    "pinced nerve"   CHF (congestive heart failure) (HCC)    COPD (chronic obstructive pulmonary disease) (HCC)    Diabetes mellitus    Type II   Diverticulitis    ESRD (end stage renal disease) (  [place])    dialysis - M/W/F- [place]   GERD (gastroesophageal reflux disease)    from medications   GI bleed 03/31/2013   Head injury  07/2017   History of hiatal hernia    Hyperlipidemia    Hypertension    Neuropathy    left leg   Nuclear sclerotic cataract of right eye 04/23/2020   Osteoporosis    Peripheral vascular disease ([place])    Pneumonia    "very young" and a few years ago   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 ([place])    Vitrectomy membrane peel endolaser 05-07-2021   Seasonal allergies    Shortness of breath dyspnea    WIth exertion, when fluid builds   Sleep apnea    can't afford cpap    Vitreomacular traction syndrome, 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   05-07-2021  Vitrectomy, membrane peel, PRP, resection posterior segment of NVD, remnants of peripheral NVE remain in place due to atrophic retina, no vitreous substitute    Past Surgical History:  Procedure Laterality Date   A/V SHUNTOGRAM N/A 09/22/2016   Procedure: A/V Shuntogram - left arm;  Surgeon: Serafina Mitchell, MD;  Location: [place] [facility];  Service: Cardiovascular;  Laterality: N/A;   A/V SHUNTOGRAM N/A 03/22/2018   Procedure: A/V SHUNTOGRAM - left arm;  Surgeon: Serafina Mitchell, MD;  Location: [facility] [facility];  Service: Cardiovascular;  Laterality: N/A;   A/V SHUNTOGRAM Left 11/17/2018   Procedure: A/V SHUNTOGRAM;  Surgeon: Angelia Mould, MD;  Location: [place] [facility];  Service: Cardiovascular;  Laterality: Left;   ABDOMINAL HYSTERECTOMY  1993`   AMPUTATION Left 09/01/2016   Procedure: LEFT FOOT TRANSMETATARSAL AMPUTATION;  Surgeon: Newt Minion, MD;  Location: [place];  Service: Orthopedics;  Laterality: Left;   AMPUTATION Right 08/11/2017   Procedure: RIGHT GREAT TOE AMPUTATION DIGIT;  Surgeon: Rosetta Posner, MD;  Location: [place];  Service: Vascular;  Laterality: Right;   AMPUTATION Right 12/31/2017   Procedure: RIGHT TRANSMETATARSAL AMPUTATION;  Surgeon: Newt Minion, MD;  Location: [place];  Service: Orthopedics;  Laterality: Right;   AV FISTULA PLACEMENT Left 04/21/2016    Procedure: INSERTION OF ARTERIOVENOUS (AV) GORE-TEX GRAFT ARM LEFT;  Surgeon: Elam Dutch, MD;  Location: [place];  Service: Vascular;  Laterality: Left;   BASCILIC VEIN TRANSPOSITION Left 07/10/2014   Procedure: BASCILIC VEIN TRANSPOSITION;  Surgeon: Angelia Mould, MD;  Location: [place];  Service: Vascular;  Laterality: Left;   [place] Right 11/08/2014   Procedure: FIRST STAGE BASILIC VEIN TRANSPOSITION;  Surgeon: Angelia Mould, MD;  Location: [place];  Service: Vascular;  Laterality: Right;   [place] Right 01/18/2015   Procedure: SECOND STAGE BASILIC VEIN TRANSPOSITION;  Surgeon: Angelia Mould, MD;  Location: [place];  Service: Vascular;  Laterality: Right;   CRANIOTOMY N/A 08/23/2017   Procedure: CRANIOTOMY HEMATOMA EVACUATION SUBDURAL;  Surgeon: Ashok Pall, MD;  Location: [place];  Service: Neurosurgery;  Laterality: N/A;   CRANIOTOMY Left 08/24/2017   Procedure: CRANIOTOMY FOR RECURRENT ACUTE SUBDURAL HEMATOMA;  Surgeon: Ashok Pall, MD;  Location: [place];  Service: Neurosurgery;  Laterality: Left;   ESOPHAGOGASTRODUODENOSCOPY N/A 03/31/2013   Procedure: ESOPHAGOGASTRODUODENOSCOPY (EGD);  Surgeon: Gatha Mayer, MD;  Location: [facility];  Service: Endoscopy;  Laterality: N/A;   EYE SURGERY     laser surgery   FEMORAL-POPLITEAL BYPASS GRAFT Left 07/08/2016   Procedure: LEFT  FEMORAL-BELOW KNEE POPLITEAL  ARTERY BYPASS GRAFT USING 6MM X 80 CM PROPATEN GORETEX GRAFT WITH RINGS.;  Surgeon: Rosetta Posner, MD;  Location: [facility] OR;  Service: Vascular;  Laterality: Left;   FEMORAL-POPLITEAL BYPASS GRAFT Right 08/11/2017   Procedure: RIGHT FEMORAL TO BELOW KNEE POPLITEAL ARTERKY  BYPASS GRAFT USING 6MM RINGED PROPATEN GRAFT;  Surgeon: Rosetta Posner, MD;  Location: [place];  Service: Vascular;  Laterality: Right;   FISTULOGRAM Left 10/29/2014   Procedure: FISTULOGRAM;  Surgeon: Angelia Mould, MD;  Location: [facility] CATH LAB;  Service:  Cardiovascular;  Laterality: Left;   GAS/FLUID EXCHANGE Left 12/20/2018   Procedure: AIR GAS EXCHANGE;  Surgeon: Jalene Mullet, MD;  Location: [place];  Service: Ophthalmology;  Laterality: Left;   INSERTION OF DIALYSIS CATHETER N/A 09/05/2020   Procedure: INSERTION OF DIALYSIS CATHETER;  Surgeon: Cherre Robins, MD;  Location: [place];  Service: Vascular;  Laterality: N/A;   INSERTION OF DIALYSIS CATHETER Left 03/13/2021   Procedure: INSERTION OF DIALYSIS CATHETER;  Surgeon: Waynetta Sandy, MD;  Location: [place];  Service: Vascular;  Laterality: Left;   KNEE ARTHROSCOPY Right 05/06/2018   Procedure: RIGHT KNEE ARTHROSCOPY;  Surgeon: Newt Minion, MD;  Location: [place];  Service: Orthopedics;  Laterality: Right;   KNEE ARTHROSCOPY Right 06/10/2018   Procedure: RIGHT KNEE ARTHROSCOPY AND DEBRIDEMENT;  Surgeon: Newt Minion, MD;  Location: [place];  Service: Orthopedics;  Laterality: Right;   LIGATION OF ARTERIOVENOUS  FISTULA Left 04/21/2016   Procedure: LIGATION OF ARTERIOVENOUS  FISTULA LEFT ARM;  Surgeon: Elam Dutch, MD;  Location: [place];  Service: Vascular;  Laterality: Left;   LOWER EXTREMITY ANGIOGRAPHY N/A 04/06/2017   Procedure: Lower Extremity Angiography - Right;  Surgeon: Serafina Mitchell, MD;  Location: [place] [facility];  Service: Cardiovascular;  Laterality: N/A;   MEMBRANE PEEL Left 12/20/2018   Procedure: MEMBRANE PEEL;  Surgeon: Jalene Mullet, MD;  Location: [place];  Service: Ophthalmology;  Laterality: Left;   MEMBRANE PEEL Right 05/29/2022   Procedure: MEMBRANE PEEL;  Surgeon: Hurman Horn, MD;  Location: [place];  Service: Ophthalmology;  Laterality: Right;   PARS PLANA VITRECTOMY Right 05/29/2022   Procedure: PARS PLANA VITRECTOMY WITH 25 GAUGE;  Surgeon: Hurman Horn, MD;  Location: [place];  Service: Ophthalmology;  Laterality: Right;   PATCH ANGIOPLASTY Right 01/18/2015   Procedure: BASILIC VEIN PATCH ANGIOPLASTY USING VASCUGUARD PATCH;  Surgeon: Angelia Mould, MD;  Location: [place];  Service: Vascular;  Laterality: Right;   PERIPHERAL VASCULAR BALLOON ANGIOPLASTY  09/22/2016   Procedure: Peripheral Vascular Balloon Angioplasty;  Surgeon: Serafina Mitchell, MD;  Location: [place] [facility];  Service: Cardiovascular;;  Lt. Fistula   PERIPHERAL VASCULAR BALLOON ANGIOPLASTY Left 03/22/2018   Procedure: PERIPHERAL VASCULAR BALLOON ANGIOPLASTY;  Surgeon: Serafina Mitchell, MD;  Location: [place] [facility];  Service: Cardiovascular;  Laterality: Left;  Arm shunt   PERIPHERAL VASCULAR CATHETERIZATION N/A 06/23/2016   Procedure: Abdominal Aortogram w/Lower Extremity;  Surgeon: Serafina Mitchell, MD;  Location: [place] [facility];  Service: Cardiovascular;  Laterality: N/A;   PERIPHERAL VASCULAR CATHETERIZATION  06/23/2016   Procedure: Peripheral Vascular Intervention;  Surgeon: Serafina Mitchell, MD;  Location: [place] [facility];  Service: Cardiovascular;;  lt common and external illiac artery   PHOTOCOAGULATION WITH LASER Left 12/20/2018   Procedure: PHOTOCOAGULATION WITH LASER;  Surgeon: Jalene Mullet, MD;  Location: [place];  Service: Ophthalmology;  Laterality: Left;   REPAIR OF COMPLEX TRACTION  RETINAL DETACHMENT Left 12/20/2018   Procedure: REPAIR OF COMPLEX TRACTION RETINAL DETACHMENT;  Surgeon: Jalene Mullet, MD;  Location: [place];  Service: Ophthalmology;  Laterality: Left;   REVISION OF ARTERIOVENOUS GORETEX GRAFT Left 09/05/2020   Procedure: REVISION OF ARTERIOVENOUS GORETEX GRAFT LEFT;  Surgeon: Cherre Robins, MD;  Location: [place];  Service: Vascular;  Laterality: Left;     reports that she quit smoking about 10 years ago. Her smoking use included cigarettes. She has a 14.00 pack-year smoking history. She has never used smokeless tobacco. She reports that she does not drink alcohol and does not use drugs.  Allergies  Allergen Reactions   Adhesive  [Tape] Hives   Lisinopril Cough   Prednisone Swelling and Other (See Comments)    Excessive  fluid buildup    Family History  Problem Relation Age of Onset   Other Mother        not sure of cause of death   Diabetes Father    Diabetes Sister    Diabetes Sister    Pancreatic cancer Maternal Grandmother    Colon cancer Neg Hx    *** Prior to Admission medications   Medication Sig Start Date End Date Taking? Authorizing Provider  albuterol (VENTOLIN HFA) 108 (90 Base) MCG/ACT inhaler Inhale 2 puffs into the lungs every 4 (four) hours as needed for wheezing or shortness of breath. 07/21/22   Piontek, Junie Panning, MD  amLODipine (NORVASC) 10 MG tablet Take 10 mg by mouth daily. 02/09/22   [provider]  aspirin EC 81 MG tablet Take 81 mg by mouth daily.    [provider]  atorvastatin (LIPITOR) 40 MG tablet Take 40 mg by mouth at bedtime. 07/17/21   [provider]  b complex-vitamin c-folic acid (NEPHRO-VITE) 0.8 MG TABS tablet Take 1 tablet by mouth at bedtime.    [provider]  budesonide-formoterol (SYMBICORT) 160-4.5 MCG/ACT inhaler Inhale 2 puffs into the lungs 2 (two) times daily. Patient not taking: Reported on 05/28/2022 04/07/22   Valentina Shaggy, MD  busPIRone (BUSPAR) 7.5 MG tablet Take 7.5 mg by mouth daily as needed (anxiety). 10/29/19   [provider]  carvedilol (COREG) 12.5 MG tablet Take 12.5 mg by mouth See admin instructions. Take 1 tablet (12.5 mg) by mouth once daily in the evening on Mondays, Wednesdays, & Fridays. (Dialysis) Take 1 tablet (12.5 mg) by mouth twice daily on Sundays, Tuesdays, Thursdays, & Saturdays. (Non Dialysis)    [provider]  cinacalcet (SENSIPAR) 60 MG tablet Take 60 mg by mouth See admin instructions. Take 1 tablet (60 mg) by mouth in the evening on Mondays, Wednesdays, & Fridays (dialysis) Take 1 tablet (60 mg) by mouth twice daily on Sundays, Tuesdays, Thursdays, Saturdays. (non dialysis) 11/28/18   [provider]  diphenhydrAMINE HCl (BENADRYL ALLERGY PO) Take 1 capsule by  mouth every Monday, Wednesday, and Friday with hemodialysis. If needed to help with allergies    [provider]  Doxercalciferol (HECTOROL IV) Doxercalciferol (Hectorol) 08/06/21 08/05/22  [provider]  EPINEPHrine (AUVI-Q) 0.3 mg/0.3 mL IJ SOAJ injection Inject 0.3 mg into the muscle as needed for anaphylaxis. 12/24/20   Valentina Shaggy, MD  ferric citrate (AURYXIA) 1 GM 210 MG(Fe) tablet Take 210-420 mg by mouth See admin instructions. Take 2 tablets (420 mg) by mouth with each meal & take 1 tablet (210 mg) by mouth with snacks.    [provider]  fluticasone (FLONASE) 50 MCG/ACT nasal spray Place 2  sprays into both nostrils daily as needed for allergies or rhinitis. 04/07/22   Valentina Shaggy, MD  fluticasone-salmeterol (ADVAIR [facility]) [phone] MCG/ACT inhaler Inhale two puffs twice daily Patient not taking: Reported on 05/28/2022 12/24/20   Valentina Shaggy, MD  gabapentin (NEURONTIN) 100 MG capsule TAKE 1 CAPSULE (100 MG TOTAL) BY MOUTH AT BEDTIME. WHEN NECESSARY FOR NEUROPATHY PAIN Patient taking differently: Take 100 mg by mouth at bedtime as needed (When necessary for neuropathy pain). 12/13/18   Newt Minion, MD  glipiZIDE (GLUCOTROL) 5 MG tablet Take 5 mg by mouth at bedtime.    [provider]  guaiFENesin-codeine 100-10 MG/5ML syrup Take 5 mLs by mouth 3 (three) times daily as needed for cough. 07/08/21   Valentina Shaggy, MD  isosorbide-hydrALAZINE (BIDIL) 20-37.5 MG tablet Take 1 tablet by mouth See admin instructions. hs on Monday,Wednesday,Friday (dialysis days) Tid on Tuesday,Thursday,Saturday,Sunday(non-dialysis days) Patient taking differently: Take 1 tablet by mouth See admin instructions. Take in the evening on Monday,Wednesday,Friday (dialysis days) BID on Tuesday,Thursday,Saturday,Sunday(non-dialysis days) 01/08/22   Cantwell, Celeste C, PA-C  latanoprost (XALATAN) 0.005 % ophthalmic solution Place 1 drop into the left eye at  bedtime. Patient not taking: Reported on 05/28/2022 11/25/21 11/25/22  Rankin, Clent Demark, MD  levocetirizine (XYZAL) 5 MG tablet Take 1 tablet (5 mg total) by mouth every evening. 04/07/22   Valentina Shaggy, MD  lidocaine (LMX) 4 % cream Apply 1 Application topically every Monday, Wednesday, and Friday with hemodialysis. 05/22/22   [provider]  lidocaine-prilocaine (EMLA) cream Apply 1 Application topically as needed. Patient not taking: Reported on 05/28/2022 03/31/22   Vanessa Kick, MD  loperamide (IMODIUM) 2 MG capsule Take 2 capsules (4 mg total) by mouth 3 (three) times daily as needed for diarrhea or loose stools. 08/04/21   Bonnielee Haff, MD  Methoxy PEG-Epoetin Beta (MIRCERA IJ) Mircera 08/20/21 10/23/22  [provider]  montelukast (SINGULAIR) 10 MG tablet TAKE 1 TABLET BY MOUTH EVERYDAY AT BEDTIME 04/07/22   Valentina Shaggy, MD  ofloxacin (OCUFLOX) 0.3 % ophthalmic solution Place 1 drop into the right eye 4 (four) times daily. 05/27/22   [provider]  omeprazole (PRILOSEC) 20 MG capsule Take 20 mg by mouth daily as needed.    [provider]  ondansetron (ZOFRAN) 4 MG tablet Take 1 tablet (4 mg total) by mouth every 6 (six) hours as needed for nausea. 05/22/19   Aline August, MD  polyethylene glycol (MIRALAX / GLYCOLAX) 17 g packet Take 17 g by mouth daily. Patient taking differently: Take 17 g by mouth daily as needed for mild constipation. 02/04/20   Wieters, Hallie C, PA-C  prednisoLONE acetate (PRED FORTE) 1 % ophthalmic suspension Place 1 drop into the right eye 4 (four) times daily. 05/27/22   [provider]  promethazine-dextromethorphan (PROMETHAZINE-DM) 6.25-15 MG/5ML syrup Take 5 mLs by mouth 4 (four) times daily as needed for cough. 07/21/22   Rondel Oh, MD  Spacer/Aero-Holding Chambers (AEROCHAMBER PLUS) inhaler Use with inhaler 03/09/21   Melynda Ripple, MD  traZODone (DESYREL) 50 MG tablet Take 25-50 mg by mouth at  bedtime as needed for sleep. 10/29/19   [provider]    Physical Exam: Vitals:   08/23/2022 1739  BP: (!) 131/59  Pulse: (!) 103  Resp: (!) 24  Temp: 98.2 F (36.8 C)  SpO2: 94%    Constitutional: NAD, calm, comfortable Vitals:   08/23/2022 1739  BP: (!) 131/59  Pulse: (!) 103  Resp: (!)  24  Temp: 98.2 F (36.8 C)  SpO2: 94%   Eyes: PERRL, lids and conjunctivae normal ENMT: Mucous membranes are moist. Posterior pharynx clear of any exudate or lesions.Normal dentition.  Neck: normal, supple, no masses, no thyromegaly Respiratory: clear to auscultation bilaterally, no wheezing, no crackles. Normal respiratory effort. No accessory muscle use.  Cardiovascular: Regular rate and rhythm, no murmurs / rubs / gallops. No extremity edema. 2+ pedal pulses. No carotid bruits.  Abdomen: no tenderness, no masses palpated. No hepatosplenomegaly. Bowel sounds positive.  Musculoskeletal: no clubbing / cyanosis. No joint deformity upper and lower extremities. Good ROM, no contractures. Normal muscle tone.  Skin: no rashes, lesions, ulcers. No induration Neurologic: CN 2-12 grossly intact. Sensation intact, DTR normal. Strength 5/5 in all 4.  Psychiatric: Normal judgment and insight. Alert and oriented x 3. Normal mood.    Labs on Admission: I have personally reviewed following labs and imaging studies  CBC: Recent Labs  Lab 07/30/22 1821 08/06/2022 1730  WBC 13.7* 19.6*  NEUTROABS 11.5* 17.7*  HGB 13.6 11.6*  HCT 41.9 36.5  MCV 97.9 100.0  PLT 237 275   Basic Metabolic Panel: Recent Labs  Lab 07/30/22 1821 08/19/2022 1730  NA 134* 129*  K 3.3* 3.3*  CL 91* 88*  CO2 27 25  GLUCOSE 128* 250*  BUN 13 21  CREATININE 5.19* 6.42*  CALCIUM 10.2 9.7   GFR: CrCl cannot be calculated (Unknown ideal weight.). Liver Function Tests: Recent Labs  Lab 07/30/22 1821 08/20/2022 1730  AST 15 19  ALT 11 9  ALKPHOS 84 93  BILITOT 0.6 0.6  PROT 8.0 6.3*  ALBUMIN 3.4* 2.7*    Recent Labs  Lab 07/30/22 1821 08/26/2022 1730  LIPASE 34 32   No results for input(s): "AMMONIA" in the last 168 hours. Coagulation Profile: No results for input(s): "INR", "PROTIME" in the last 168 hours. Cardiac Enzymes: No results for input(s): "CKTOTAL", "CKMB", "CKMBINDEX", "TROPONINI" in the last 168 hours. BNP (last 3 results) No results for input(s): "PROBNP" in the last 8760 hours. HbA1C: No results for input(s): "HGBA1C" in the last 72 hours. CBG: No results for input(s): "GLUCAP" in the last 168 hours. Lipid Profile: No results for input(s): "CHOL", "HDL", "LDLCALC", "TRIG", "CHOLHDL", "LDLDIRECT" in the last 72 hours. Thyroid Function Tests: No results for input(s): "TSH", "T4TOTAL", "FREET4", "T3FREE", "THYROIDAB" in the last 72 hours. Anemia Panel: No results for input(s): "VITAMINB12", "FOLATE", "FERRITIN", "TIBC", "IRON", "RETICCTPCT" in the last 72 hours. Urine analysis:    Component Value Date/Time   COLORURINE YELLOW 07/07/2016 0914   APPEARANCEUR CLOUDY (A) 07/07/2016 0914   LABSPEC >1.030 (H) 07/07/2016 0914   PHURINE 5.5 07/07/2016 0914   GLUCOSEU NEGATIVE 07/07/2016 0914   HGBUR SMALL (A) 07/07/2016 0914   BILIRUBINUR SMALL (A) 07/07/2016 0914   KETONESUR 15 (A) 07/07/2016 0914   PROTEINUR >300 (A) 07/07/2016 0914   UROBILINOGEN 0.2 07/30/2014 2000   NITRITE NEGATIVE 07/07/2016 0914   LEUKOCYTESUR SMALL (A) 07/07/2016 0914    Radiological Exams on Admission: CT ABDOMEN PELVIS W CONTRAST  Result Date: 08/03/2022 CLINICAL DATA:  Right groin and right abdominal pain. EXAM: CT ABDOMEN AND PELVIS WITH CONTRAST TECHNIQUE: Multidetector CT imaging of the abdomen and pelvis was performed using the standard protocol following bolus administration of intravenous contrast. RADIATION DOSE REDUCTION: This exam was performed according to the departmental dose-optimization program which includes automated exposure control, adjustment of the mA and/or kV according  to patient size and/or use of iterative reconstruction technique.  CONTRAST:  12m OMNIPAQUE IOHEXOL 350 MG/ML SOLN COMPARISON:  07/31/2022 FINDINGS: Lower chest: No acute abnormality. Hepatobiliary: Cholelithiasis without pericholecystic inflammatory change noted. The liver is unremarkable. No intra or extrahepatic biliary ductal dilation. Pancreas: Unremarkable Spleen: Unremarkable Adrenals/Urinary Tract: The adrenal glands are unremarkable. The kidneys are normal in position. There is mild to moderate renal cortical atrophy bilaterally. Multiple simple cortical cysts are seen within the kidneys bilaterally. Within the upper pole of the left kidney, however, a hypoenhancing 2.1 cm lesion is identified suspicious for AA primary renal neoplasm. Vascular calcifications noted within the renal hila bilaterally. No definite urinary renal or ureteral calculi. No hydronephrosis. The bladder is unremarkable. Stomach/Bowel: The stomach, small bowel, and large bowel are unremarkable. The previously noted loculated collection along the right pelvic sidewall is again identified and is enlarged, measuring 3.9 x 5.3 cm at axial image # 69/3. As noted on prior examination, there is irregular mural enhancement, enhancing internal septa, and mild surrounding inflammatory stranding. Additionally, on the current examination, there has developed gas within the superior aspect of the mass, best seen on axial image # 68/3. When compared to remote prior examination of 10/15/2020, this is seen immediately adjacent to the appendix and complex right adnexal cystic lesion. Altogether, the findings are suspicious for a perforated appendicitis with a periappendiceal abscess with secondary involvement of the previously noted adnexal mass or a tubo-ovarian abscess. A malignant lesion is considered less likely given its relatively rapid evolution though a secondarily infected malignancy could appear in this fashion. No free intraperitoneal gas or  fluid. No evidence of obstruction. Vascular/Lymphatic: Extensive aortoiliac atherosclerotic calcification. Extensive visceral arteriosclerosis. 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 No pathologic adenopathy within the abdomen and pelvis. Reproductive: Uterus absent.  No adnexal masses on the left. Other: Small right femoral hernia is again identified though bowel within the hernia sac noted on prior examination has spontaneously reduced in the interval. Musculoskeletal: Degenerative changes are seen within the lumbar spine. No acute bone abnormality. No lytic or blastic bone lesion. IMPRESSION: 1. Interval enlargement of the previously noted loculated collection along the right pelvic sidewall with irregular mural enhancement, enhancing internal septa, and mild surrounding inflammatory stranding. Additionally, there has developed gas within the superior aspect of the mass, best seen on axial image # 68/3. When compared to remote prior examination of 10/15/2020, this is seen immediately adjacent to the appendix and complex right adnexal cystic lesion. Altogether, the findings are suspicious for a perforated appendicitis with a periappendiceal abscess with probable secondary involvement of the previously noted adnexal mass or a tubo-ovarian abscess. A malignant lesion is considered less likely given its relatively rapid evolution though a secondarily infected malignancy could appear in this fashion. Surgical consultation is recommended. 2. 2.1 cm hypoenhancing lesion within the upper pole of the left kidney suspicious for a primary renal neoplasm. Dedicated renal mass protocol CT or MRI examination is recommended for evaluation once the patient's acute issues have resolved. 3.  Cholelithiasis. 4. Small right fat containing femoral hernia. Previously noted bowel within the hernia sac has spontaneously reduced. Aortic Atherosclerosis (ICD10-I70.0). Electronically Signed   By: AFidela SalisburyM.D.   On: 07/30/2022 20:35   UKoreaAbdomen Limited RUQ (LIVER/GB)  Result Date: 08/12/2022 CLINICAL DATA:  Right-sided abdominal pain EXAM: ULTRASOUND ABDOMEN LIMITED RIGHT UPPER QUADRANT COMPARISON:  CT  abdomen and pelvis 07/31/2022 FINDINGS: Gallbladder: Small gallstones are present. There is no gallbladder wall thickening or pericholecystic fluid. Sonographic Percell Miller sign is positive per sonographer. Common bile duct: Diameter: 5.1 mm Liver: No focal lesion identified. Increase in parenchymal echogenicity. Portal vein is patent on color Doppler imaging with normal direction of blood flow towards the liver. Other: 1.4 x 2.2 x 1.4 cm right renal cyst. Right kidney appears echogenic. IMPRESSION: 1. Cholelithiasis with positive sonographic Murphy sign. No gallbladder wall thickening or pericholecystic fluid. Findings are equivocal for acute cholecystitis. 2. Increased echogenicity of the liver. This is a nonspecific finding but may be seen in the setting of hepatic steatosis. 3. Echogenic right kidney, suggesting medical renal disease. Electronically Signed   By: Ronney Asters M.D.   On: 08/20/2022 18:35    EKG: Independently reviewed. ***  Assessment/Plan  Intraabdominal infection with concern for gallbladder disease vs incarcerated inguinal hernia vs appendicitis  -admit progressive care  -start on ivfs  -continue on broad spectrum abx  -blood cultures ordered  -trend inflammatory markers and lactic acid  - f/u with surgery final recs   Hypertension -at baseline  -hold oral medications currently due to npo status  -place on prn  meds   Hyperlipidemia -hold statin currently    HFpEF -euvolemic  -hold on carvediool , imdur due npo status with intraabd infection  -place on  metoprolol iv q6 hours low dose   DMII -uncontrolled with hyperglycemia -place on iss /fs -last A1c  6.4 ( 08/04/21) -repeat pending    Asthma/COPD -no current exacerbation  -resume home regimen symbicort/prn nebs    ESRD on HD(M/W/F) -last HD Friday  -renal consult for HD    Anxiety  GERD -hx of gastritis  -ppi bid    OSA  -not cpap due to prohibitive cost -monitor pulse ox  DVT prophylaxis: scd Code Status:  Family Communication: none at bedside Disposition Plan: patient  expected to be admitted greater than 2 midnights  Consults called: surgery DR White/ Renal Ejigiri Admission status: progressive care    Clance Boll MD [facility]   If 7PM-7AM, please contact night-coverage [email or web address] Password [facility]  08/26/2022, 9:21 PM

## 2022-08-02 NOTE — Consult Note (Signed)
Consulting Physician: Nickola Major Latrelle Fuston  Referring Provider: Dr. Regenia Skeeter  Chief Complaint: Abdominal pain  Reason for Consult: Possible cholecystitis   Subjective   HPI: Destiny Day is an 72 y.o. female who is here for severe right sided abdominal pain.  The pain wraps around the right abdomen just at or below the level of the umbilicus.  It has slowly built in severity for the last week and a half.  She has not had an appetite and not eaten anything since mid week.  She   She had a GI bleed with upper endoscopy in 2014 that showed gastritis, otherwise normal.  Past Medical History:  Diagnosis Date   Abdominal bruit    Anxiety    Arthritis    Osteoarthritis   Asthma    Cervical disc disease    "pinced nerve"   CHF (congestive heart failure) (HCC)    COPD (chronic obstructive pulmonary disease) (HCC)    Diabetes mellitus    Type II   Diverticulitis    ESRD (end stage renal disease) ([place])    dialysis - M/W/F- [place]   GERD (gastroesophageal reflux disease)    from medications   GI bleed 03/31/2013   Head injury 07/2017   History of hiatal hernia    Hyperlipidemia    Hypertension    Neuropathy    left leg   Nuclear sclerotic cataract of right eye 04/23/2020   Osteoporosis    Peripheral vascular disease ([place])    Pneumonia    "very young" and a few years ago   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)    Vitrectomy membrane peel endolaser 05-07-2021   Seasonal allergies    Shortness of breath dyspnea    WIth exertion, when fluid builds   Sleep apnea    can't afford cpap    Vitreomacular traction syndrome, 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   05-07-2021  Vitrectomy, membrane peel, PRP, resection posterior segment of NVD, remnants of peripheral NVE remain in place due to atrophic retina, no vitreous substitute    Past Surgical History:  Procedure Laterality Date   A/V SHUNTOGRAM N/A 09/22/2016    Procedure: A/V Shuntogram - left arm;  Surgeon: Serafina Mitchell, MD;  Location: [place] [facility];  Service: Cardiovascular;  Laterality: N/A;   A/V SHUNTOGRAM N/A 03/22/2018   Procedure: A/V SHUNTOGRAM - left arm;  Surgeon: Serafina Mitchell, MD;  Location: [place] [facility];  Service: Cardiovascular;  Laterality: N/A;   A/V SHUNTOGRAM Left 11/17/2018   Procedure: A/V SHUNTOGRAM;  Surgeon: Angelia Mould, MD;  Location: [place] [facility];  Service: Cardiovascular;  Laterality: Left;   ABDOMINAL HYSTERECTOMY  1993`   AMPUTATION Left 09/01/2016   Procedure: LEFT FOOT TRANSMETATARSAL AMPUTATION;  Surgeon: Newt Minion, MD;  Location: [place];  Service: Orthopedics;  Laterality: Left;   AMPUTATION Right 08/11/2017   Procedure: RIGHT GREAT TOE AMPUTATION DIGIT;  Surgeon: Rosetta Posner, MD;  Location: [place];  Service: Vascular;  Laterality: Right;   AMPUTATION Right 12/31/2017   Procedure: RIGHT TRANSMETATARSAL AMPUTATION;  Surgeon: Newt Minion, MD;  Location: [place];  Service: Orthopedics;  Laterality: Right;   AV FISTULA PLACEMENT Left 04/21/2016   Procedure: INSERTION OF ARTERIOVENOUS (AV) GORE-TEX GRAFT ARM LEFT;  Surgeon: Elam Dutch, MD;  Location: [place];  Service: Vascular;  Laterality: Left;   BASCILIC VEIN TRANSPOSITION Left 07/10/2014   Procedure: BASCILIC VEIN TRANSPOSITION;  Surgeon: Harrell Gave  Nicole Cella, MD;  Location: [place];  Service: Vascular;  Laterality: Left;   [place] Right 11/08/2014   Procedure: FIRST STAGE BASILIC VEIN TRANSPOSITION;  Surgeon: Angelia Mould, MD;  Location: [place];  Service: Vascular;  Laterality: Right;   [place] Right 01/18/2015   Procedure: SECOND STAGE BASILIC VEIN TRANSPOSITION;  Surgeon: Angelia Mould, MD;  Location: [place];  Service: Vascular;  Laterality: Right;   CRANIOTOMY N/A 08/23/2017   Procedure: CRANIOTOMY HEMATOMA EVACUATION SUBDURAL;  Surgeon: Ashok Pall, MD;  Location: [place];   Service: Neurosurgery;  Laterality: N/A;   CRANIOTOMY Left 08/24/2017   Procedure: CRANIOTOMY FOR RECURRENT ACUTE SUBDURAL HEMATOMA;  Surgeon: Ashok Pall, MD;  Location: [place];  Service: Neurosurgery;  Laterality: Left;   ESOPHAGOGASTRODUODENOSCOPY N/A 03/31/2013   Procedure: ESOPHAGOGASTRODUODENOSCOPY (EGD);  Surgeon: Gatha Mayer, MD;  Location: [facility] ENDOSCOPY;  Service: Endoscopy;  Laterality: N/A;   EYE SURGERY     laser surgery   FEMORAL-POPLITEAL BYPASS GRAFT Left 07/08/2016   Procedure: LEFT  FEMORAL-BELOW KNEE POPLITEAL ARTERY BYPASS GRAFT USING 6MM X 80 CM PROPATEN GORETEX GRAFT WITH RINGS.;  Surgeon: Rosetta Posner, MD;  Location: [facility] OR;  Service: Vascular;  Laterality: Left;   FEMORAL-POPLITEAL BYPASS GRAFT Right 08/11/2017   Procedure: RIGHT FEMORAL TO BELOW KNEE POPLITEAL ARTERKY  BYPASS GRAFT USING 6MM RINGED PROPATEN GRAFT;  Surgeon: Rosetta Posner, MD;  Location: [place];  Service: Vascular;  Laterality: Right;   FISTULOGRAM Left 10/29/2014   Procedure: FISTULOGRAM;  Surgeon: Angelia Mould, MD;  Location: [facility] CATH LAB;  Service: Cardiovascular;  Laterality: Left;   GAS/FLUID EXCHANGE Left 12/20/2018   Procedure: AIR GAS EXCHANGE;  Surgeon: Jalene Mullet, MD;  Location: [place];  Service: Ophthalmology;  Laterality: Left;   INSERTION OF DIALYSIS CATHETER N/A 09/05/2020   Procedure: INSERTION OF DIALYSIS CATHETER;  Surgeon: Cherre Robins, MD;  Location: [place];  Service: Vascular;  Laterality: N/A;   INSERTION OF DIALYSIS CATHETER Left 03/13/2021   Procedure: INSERTION OF DIALYSIS CATHETER;  Surgeon: Waynetta Sandy, MD;  Location: [place];  Service: Vascular;  Laterality: Left;   KNEE ARTHROSCOPY Right 05/06/2018   Procedure: RIGHT KNEE ARTHROSCOPY;  Surgeon: Newt Minion, MD;  Location: [place];  Service: Orthopedics;  Laterality: Right;   KNEE ARTHROSCOPY Right 06/10/2018   Procedure: RIGHT KNEE ARTHROSCOPY AND DEBRIDEMENT;  Surgeon: Newt Minion, MD;  Location: [place];   Service: Orthopedics;  Laterality: Right;   LIGATION OF ARTERIOVENOUS  FISTULA Left 04/21/2016   Procedure: LIGATION OF ARTERIOVENOUS  FISTULA LEFT ARM;  Surgeon: Elam Dutch, MD;  Location: [place];  Service: Vascular;  Laterality: Left;   LOWER EXTREMITY ANGIOGRAPHY N/A 04/06/2017   Procedure: Lower Extremity Angiography - Right;  Surgeon: Serafina Mitchell, MD;  Location: [place] CV LAB;  Service: Cardiovascular;  Laterality: N/A;   MEMBRANE PEEL Left 12/20/2018   Procedure: MEMBRANE PEEL;  Surgeon: Jalene Mullet, MD;  Location: [place];  Service: Ophthalmology;  Laterality: Left;   MEMBRANE PEEL Right 05/29/2022   Procedure: MEMBRANE PEEL;  Surgeon: Hurman Horn, MD;  Location: [place];  Service: Ophthalmology;  Laterality: Right;   PARS PLANA VITRECTOMY Right 05/29/2022   Procedure: PARS PLANA VITRECTOMY WITH 25 GAUGE;  Surgeon: Hurman Horn, MD;  Location: [place];  Service: Ophthalmology;  Laterality: Right;   PATCH ANGIOPLASTY Right 01/18/2015   Procedure: BASILIC VEIN PATCH ANGIOPLASTY USING VASCUGUARD PATCH;  Surgeon: Judeth Cornfield  Scot Dock, MD;  Location: [place];  Service: Vascular;  Laterality: Right;   PERIPHERAL VASCULAR BALLOON ANGIOPLASTY  09/22/2016   Procedure: Peripheral Vascular Balloon Angioplasty;  Surgeon: Serafina Mitchell, MD;  Location: [place] [facility];  Service: Cardiovascular;;  Lt. Fistula   PERIPHERAL VASCULAR BALLOON ANGIOPLASTY Left 03/22/2018   Procedure: PERIPHERAL VASCULAR BALLOON ANGIOPLASTY;  Surgeon: Serafina Mitchell, MD;  Location: [place] [facility] LAB;  Service: Cardiovascular;  Laterality: Left;  Arm shunt   PERIPHERAL VASCULAR CATHETERIZATION N/A 06/23/2016   Procedure: Abdominal Aortogram w/Lower Extremity;  Surgeon: Serafina Mitchell, MD;  Location: [place] CV LAB;  Service: Cardiovascular;  Laterality: N/A;   PERIPHERAL VASCULAR CATHETERIZATION  06/23/2016   Procedure: Peripheral Vascular Intervention;  Surgeon: Serafina Mitchell, MD;  Location: [place]  CV LAB;  Service: Cardiovascular;;  lt common and external illiac artery   PHOTOCOAGULATION WITH LASER Left 12/20/2018   Procedure: PHOTOCOAGULATION WITH LASER;  Surgeon: Jalene Mullet, MD;  Location: [place];  Service: Ophthalmology;  Laterality: Left;   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   Procedure: REPAIR OF COMPLEX TRACTION RETINAL DETACHMENT;  Surgeon: Jalene Mullet, MD;  Location: [place];  Service: Ophthalmology;  Laterality: Left;   REVISION OF ARTERIOVENOUS GORETEX GRAFT Left 09/05/2020   Procedure: REVISION OF ARTERIOVENOUS GORETEX GRAFT LEFT;  Surgeon: Cherre Robins, MD;  Location: [facility] OR;  Service: Vascular;  Laterality: Left;    Family History  Problem Relation Age of Onset   Other Mother        not sure of cause of death   Diabetes Father    Diabetes Sister    Diabetes Sister    Pancreatic cancer Maternal Grandmother    Colon cancer Neg Hx     Social:  reports that she quit smoking about 10 years ago. Her smoking use included cigarettes. She has a 14.00 pack-year smoking history. She has never used smokeless tobacco. She reports that she does not drink alcohol and does not use drugs.  Allergies:  Allergies  Allergen Reactions   Adhesive  [Tape] Hives   Lisinopril Cough   Prednisone Swelling and Other (See Comments)    Excessive fluid buildup    Medications: Current Outpatient Medications  Medication Instructions   albuterol (VENTOLIN HFA) 108 (90 Base) MCG/ACT inhaler 2 puffs, Inhalation, Every 4 hours PRN   amLODipine (NORVASC) 10 mg, Oral, Daily   aspirin EC 81 mg, Oral, Daily   atorvastatin (LIPITOR) 40 mg, Oral, Daily at bedtime   b complex-vitamin c-folic acid (NEPHRO-VITE) 0.8 MG TABS tablet 1 tablet, Oral, Daily at bedtime   budesonide-formoterol (SYMBICORT) 160-4.5 MCG/ACT inhaler 2 puffs, Inhalation, 2 times daily   busPIRone (BUSPAR) 7.5 mg, Oral, Daily PRN   carvedilol (COREG) 12.5 mg, Oral, See admin instructions, Take 1  tablet (12.5 mg) by mouth once daily in the evening on Mondays, Wednesdays, & Fridays. (Dialysis)<BR>Take 1 tablet (12.5 mg) by mouth twice daily on Sundays, Tuesdays, Thursdays, & Saturdays. (Non Dialysis)   cinacalcet (SENSIPAR) 60 mg, Oral, See admin instructions, Take 1 tablet (60 mg) by mouth in the evening on Mondays, Wednesdays, & Fridays (dialysis)<BR>Take 1 tablet (60 mg) by mouth twice daily on Sundays, Tuesdays, Thursdays, Saturdays. (non dialysis)   diphenhydrAMINE HCl (BENADRYL ALLERGY PO) 1 capsule, Oral, Every M-W-F (Hemodialysis), If needed to help with allergies   Doxercalciferol (HECTOROL IV) Doxercalciferol (Hectorol)   EPINEPHrine (AUVI-Q) 0.3 mg, Intramuscular, As needed   ferric citrate (AURYXIA) 210-420 mg, Oral, See  admin instructions, Take 2 tablets (420 mg) by mouth with each meal & take 1 tablet (210 mg) by mouth with snacks.    fluticasone (FLONASE) 50 MCG/ACT nasal spray 2 sprays, Each Nare, Daily PRN   fluticasone-salmeterol (ADVAIR HFA) 115-21 MCG/ACT inhaler Inhale two puffs twice daily   gabapentin (NEURONTIN) 100 mg, Oral, Daily at bedtime, When necessary for neuropathy pain   glipiZIDE (GLUCOTROL) 5 mg, Oral, Daily at bedtime   guaiFENesin-codeine 100-10 MG/5ML syrup 5 mLs, Oral, 3 times daily PRN   isosorbide-hydrALAZINE (BIDIL) 20-37.5 MG tablet 1 tablet, Oral, See admin instructions, hs on Monday,Wednesday,Friday (dialysis days)<BR>Tid on Tuesday,Thursday,Saturday,Sunday(non-dialysis days)   latanoprost (XALATAN) 0.005 % ophthalmic solution 1 drop, Left Eye, Daily at bedtime   levocetirizine (XYZAL) 5 mg, Oral, Every evening   lidocaine (LMX) 4 % cream 1 Application, Topical, Every M-W-F (Hemodialysis)   lidocaine-prilocaine (EMLA) cream 1 Application, Topical, As needed   loperamide (IMODIUM) 4 mg, Oral, 3 times daily PRN   Methoxy PEG-Epoetin Beta (MIRCERA IJ) Mircera   montelukast (SINGULAIR) 10 MG tablet TAKE 1 TABLET BY MOUTH EVERYDAY AT BEDTIME    ofloxacin (OCUFLOX) 0.3 % ophthalmic solution 1 drop, Right Eye, 4 times daily   omeprazole (PRILOSEC) 20 mg, Oral, Daily PRN   ondansetron (ZOFRAN) 4 mg, Oral, Every 6 hours PRN   polyethylene glycol (MIRALAX / GLYCOLAX) 17 g, Oral, Daily   prednisoLONE acetate (PRED FORTE) 1 % ophthalmic suspension 1 drop, Right Eye, 4 times daily   promethazine-dextromethorphan (PROMETHAZINE-DM) 6.25-15 MG/5ML syrup 5 mLs, Oral, 4 times daily PRN   Spacer/Aero-Holding Chambers (AEROCHAMBER PLUS) inhaler Use with inhaler   traZODone (DESYREL) 25-50 mg, Oral, At bedtime PRN    ROS - all of the below systems have been reviewed with the patient and positives are indicated with bold text General: chills, fever or night sweats Eyes: blurry vision or double vision ENT: epistaxis or sore throat Allergy/Immunology: itchy/watery eyes or nasal congestion Hematologic/Lymphatic: bleeding problems, blood clots or swollen lymph nodes Endocrine: temperature intolerance or unexpected weight changes Breast: new or changing breast lumps or nipple discharge Resp: cough, shortness of breath, or wheezing CV: chest pain or dyspnea on exertion GI: as per HPI GU: dysuria, trouble voiding, or hematuria MSK: joint pain or joint stiffness Neuro: TIA or stroke symptoms Derm: pruritus and skin lesion changes Psych: anxiety and depression  Objective   PE Blood pressure (!) 131/59, pulse (!) 103, temperature 98.2 F (36.8 C), resp. rate (!) 24, SpO2 94 %. Constitutional: NAD; conversant; no deformities Eyes: Moist conjunctiva; no lid lag; anicteric; PERRL Neck: Trachea midline; no thyromegaly Lungs: Normal respiratory effort; no tactile fremitus CV: RRR; no palpable thrills; no pitting edema GI: Abd tender in the right abdomen from the level of the umbilicus down.  Severe tenderness over the right inguinal region.  Difficult to palpate any clear hernia.  Also tender in the right upper quadrant, but tenderness seems more  focused lower in the abdomen. MSK: Normal range of motion of extremities; no clubbing/cyanosis Psychiatric: Appropriate affect; alert and oriented x3 Lymphatic: No palpable cervical or axillary lymphadenopathy  Results for orders placed or performed during the hospital encounter of 08/03/2022 (from the past 24 hour(s))  CBC with Differential     Status: Abnormal   Collection Time: 08/04/2022  5:30 PM  Result Value Ref Range   WBC 19.6 (H) 4.0 - 10.5 K/uL   RBC 3.65 (L) 3.87 - 5.11 MIL/uL   Hemoglobin 11.6 (L) 12.0 - 15.0 g/dL  HCT 36.5 36.0 - 46.0 %   MCV 100.0 80.0 - 100.0 fL   MCH 31.8 26.0 - 34.0 pg   MCHC 31.8 30.0 - 36.0 g/dL   RDW 12.9 11.5 - 15.5 %   Platelets 243 150 - 400 K/uL   nRBC 0.0 0.0 - 0.2 %   Neutrophils Relative % 90 %   Neutro Abs 17.7 (H) 1.7 - 7.7 K/uL   Lymphocytes Relative 4 %   Lymphs Abs 0.9 0.7 - 4.0 K/uL   Monocytes Relative 4 %   Monocytes Absolute 0.7 0.1 - 1.0 K/uL   Eosinophils Relative 1 %   Eosinophils Absolute 0.1 0.0 - 0.5 K/uL   Basophils Relative 0 %   Basophils Absolute 0.0 0.0 - 0.1 K/uL   Immature Granulocytes 1 %   Abs Immature Granulocytes 0.21 (H) 0.00 - 0.07 K/uL  Comprehensive metabolic panel     Status: Abnormal   Collection Time: 08/11/2022  5:30 PM  Result Value Ref Range   Sodium 129 (L) 135 - 145 mmol/L   Potassium 3.3 (L) 3.5 - 5.1 mmol/L   Chloride 88 (L) 98 - 111 mmol/L   CO2 25 22 - 32 mmol/L   Glucose, Bld 250 (H) 70 - 99 mg/dL   BUN 21 8 - 23 mg/dL   Creatinine, Ser 6.42 (H) 0.44 - 1.00 mg/dL   Calcium 9.7 8.9 - 10.3 mg/dL   Total Protein 6.3 (L) 6.5 - 8.1 g/dL   Albumin 2.7 (L) 3.5 - 5.0 g/dL   AST 19 15 - 41 U/L   ALT 9 0 - 44 U/L   Alkaline Phosphatase 93 38 - 126 U/L   Total Bilirubin 0.6 0.3 - 1.2 mg/dL   GFR, Estimated 6 (L) >60 mL/min   Anion gap 16 (H) 5 - 15  Lipase, blood     Status: None   Collection Time: 08/13/2022  5:30 PM  Result Value Ref Range   Lipase 32 11 - 51 U/L   *Note: Due to a large  number of results and/or encounters for the requested time period, some results have not been displayed. A complete set of results can be found in Results Review.     Imaging Orders         US Abdomen Limited RUQ (LIVER/GB)     1. Cholelithiasis with positive sonographic Murphy sign. No gallbladder wall thickening or pericholecystic fluid. Findings are equivocal for acute cholecystitis. 2. Increased echogenicity of the liver. This is a nonspecific finding but may be seen in the setting of hepatic steatosis. 3. Echogenic right kidney, suggesting medical renal disease.    CT ABD/PEL 07/31/22  Dependent gallstones without complicating factors.   Persistent but decreased in size right adnexal mass with calcification. This has been previously characterized on ultrasound examination and surgical consultation recommended.   No other focal abnormality is noted.     On my review of the CT from 2 days ago, I think there is a small bowel containing right inguinal hernia which may be a femoral type hernia.  The bowel nearby does have some gas in it, but does not look particularly dilated.   Assessment and Plan   Destiny Day is an 72 y.o. female with multiple medical co-morbidities who presented with abdominal pain, leukocytosis of 20,000, gallstones and a positive sonographic murphy sign.  Based on her exam, and CT scan from a few days ago, I am concerned about a right inguinal hernia.  I think would  be worthwhile to repeat a CT scan to evaluate for this.  If there is no hernia on CT, I'm not convinced by the ultrasound that the gallbladder is driving her pain and leukocytosis.  She is higher risk for surgery based on her medical issues.  I recommend completing a HIDA scan prior to committing her to any intervention for her gallbladder.  She also would need preoperative medical optimization.  Surgery team will follow closely.  My shift ends at 7 PM, so I will discuss the case with Dr. Dema Severin  who is taking over the general surgery service this evening.  Felicie Morn, MD  [facility] Use [email or web address] to contact on call provider  New Patient Billing: [phone] - High MDM

## 2022-08-02 NOTE — ED Provider Triage Note (Signed)
Emergency Medicine Provider Triage Evaluation Note  Destiny Day , a 72 y.o. female  was evaluated in triage.  Pt complains of worsening abdominal pain of the right side.  Seen in the ED 2 to 3 days ago for same, CT was negative for acute emergency though did indicate some gallstones without evidence of acute gallbladder disease.  Denies fevers or chills.  Endorses some constipation and diarrhea.  On dialysis, does not produce urine.  Has not missed dialysis.  DMT2, ESRD on dialysis M/W/F, HTN, anxiety  Review of Systems  Positive:  Negative: See above  Physical Exam  There were no vitals taken for this visit. Gen:   Awake, no distress   Resp:  Normal effort  MSK:   Moves extremities without difficulty  Other:  Right-sided abdominal tenderness, nondistended, protuberant, soft.  Medical Decision Making  Medically screening exam initiated at 5:20 PM.  Appropriate orders placed.  Destiny Day was informed that the remainder of the evaluation will be completed by another provider, this initial triage assessment does not replace that evaluation, and the importance of remaining in the ED until their evaluation is complete.     Destiny Rome, PA-C 12/04/00 1732

## 2022-08-02 NOTE — ED Triage Notes (Signed)
Pt here with reports of RLQ abdominal pain for the last week and a half. Seen several days ago for the same, dx with gallstones. MWF dialysis pt.  180/80 285 CBG  HR 120 98% RA

## 2022-08-03 ENCOUNTER — Inpatient Hospital Stay (HOSPITAL_COMMUNITY): Payer: Medicare Other

## 2022-08-03 DIAGNOSIS — K659 Peritonitis, unspecified: Secondary | ICD-10-CM | POA: Diagnosis not present

## 2022-08-03 DIAGNOSIS — N739 Female pelvic inflammatory disease, unspecified: Secondary | ICD-10-CM | POA: Diagnosis not present

## 2022-08-03 LAB — RETICULOCYTES
Immature Retic Fract: 13.4 % (ref 2.3–15.9)
RBC.: 3.44 MIL/uL — ABNORMAL LOW (ref 3.87–5.11)
Retic Count, Absolute: 45.1 10*3/uL (ref 19.0–186.0)
Retic Ct Pct: 1.3 % (ref 0.4–3.1)

## 2022-08-03 LAB — CBG MONITORING, ED
Glucose-Capillary: 121 mg/dL — ABNORMAL HIGH (ref 70–99)
Glucose-Capillary: 149 mg/dL — ABNORMAL HIGH (ref 70–99)
Glucose-Capillary: 151 mg/dL — ABNORMAL HIGH (ref 70–99)
Glucose-Capillary: 96 mg/dL (ref 70–99)

## 2022-08-03 LAB — FERRITIN: Ferritin: 1457 ng/mL — ABNORMAL HIGH (ref 11–307)

## 2022-08-03 LAB — COMPREHENSIVE METABOLIC PANEL
ALT: 9 U/L (ref 0–44)
AST: 15 U/L (ref 15–41)
Albumin: 2.3 g/dL — ABNORMAL LOW (ref 3.5–5.0)
Alkaline Phosphatase: 86 U/L (ref 38–126)
Anion gap: 12 (ref 5–15)
BUN: 23 mg/dL (ref 8–23)
CO2: 26 mmol/L (ref 22–32)
Calcium: 9.2 mg/dL (ref 8.9–10.3)
Chloride: 92 mmol/L — ABNORMAL LOW (ref 98–111)
Creatinine, Ser: 6.91 mg/dL — ABNORMAL HIGH (ref 0.44–1.00)
GFR, Estimated: 6 mL/min — ABNORMAL LOW (ref >60)
Glucose, Bld: 165 mg/dL — ABNORMAL HIGH (ref 70–99)
Potassium: 3.2 mmol/L — ABNORMAL LOW (ref 3.5–5.1)
Sodium: 130 mmol/L — ABNORMAL LOW (ref 135–145)
Total Bilirubin: 0.6 mg/dL (ref 0.3–1.2)
Total Protein: 5.9 g/dL — ABNORMAL LOW (ref 6.5–8.1)

## 2022-08-03 LAB — CBC
HCT: 32.7 % — ABNORMAL LOW (ref 36.0–46.0)
Hemoglobin: 10.9 g/dL — ABNORMAL LOW (ref 12.0–15.0)
MCH: 32.3 pg (ref 26.0–34.0)
MCHC: 33.3 g/dL (ref 30.0–36.0)
MCV: 97 fL (ref 80.0–100.0)
Platelets: 264 10*3/uL (ref 150–400)
RBC: 3.37 MIL/uL — ABNORMAL LOW (ref 3.87–5.11)
RDW: 13 % (ref 11.5–15.5)
WBC: 21.7 10*3/uL — ABNORMAL HIGH (ref 4.0–10.5)
nRBC: 0 % (ref 0.0–0.2)

## 2022-08-03 LAB — PROTIME-INR
INR: 1 (ref 0.8–1.2)
Prothrombin Time: 13.3 seconds (ref 11.4–15.2)

## 2022-08-03 LAB — BRAIN NATRIURETIC PEPTIDE: B Natriuretic Peptide: 1676.4 pg/mL — ABNORMAL HIGH (ref 0.0–100.0)

## 2022-08-03 LAB — C-REACTIVE PROTEIN: CRP: 20.4 mg/dL — ABNORMAL HIGH (ref <1.0)

## 2022-08-03 LAB — MAGNESIUM: Magnesium: 1.8 mg/dL (ref 1.7–2.4)

## 2022-08-03 LAB — TSH: TSH: 0.76 u[IU]/mL (ref 0.350–4.500)

## 2022-08-03 LAB — IRON AND TIBC
Iron: 26 ug/dL — ABNORMAL LOW (ref 28–170)
Saturation Ratios: 21 % (ref 10.4–31.8)
TIBC: 125 ug/dL — ABNORMAL LOW (ref 250–450)
UIBC: 99 ug/dL

## 2022-08-03 LAB — PROCALCITONIN
Procalcitonin: 2.03 ng/mL
Procalcitonin: 1.62 ng/mL

## 2022-08-03 LAB — HEMOGLOBIN AND HEMATOCRIT, BLOOD
HCT: 34.2 % — ABNORMAL LOW (ref 36.0–46.0)
Hemoglobin: 10.9 g/dL — ABNORMAL LOW (ref 12.0–15.0)

## 2022-08-03 LAB — VITAMIN B12: Vitamin B-12: 421 pg/mL (ref 180–914)

## 2022-08-03 LAB — HEMOGLOBIN A1C
Hgb A1c MFr Bld: 6.2 % — ABNORMAL HIGH (ref 4.8–5.6)
Mean Plasma Glucose: 131 mg/dL

## 2022-08-03 LAB — LACTIC ACID, PLASMA: Lactic Acid, Venous: 1.1 mmol/L (ref 0.5–1.9)

## 2022-08-03 LAB — FOLATE: Folate: 11.2 ng/mL (ref >5.9)

## 2022-08-03 MED ORDER — FENTANYL CITRATE (PF) 100 MCG/2ML IJ SOLN
INTRAMUSCULAR | Status: AC | PRN
  Administered 2022-08-03 (×4): 25 ug via INTRAVENOUS

## 2022-08-03 MED ORDER — ATORVASTATIN CALCIUM 40 MG PO TABS
40.0000 mg | ORAL_TABLET | Freq: Every day | ORAL | Status: DC
  Administered 2022-08-03 – 2022-08-26 (×23): 40 mg via ORAL
  Filled 2022-08-03 (×24): qty 1

## 2022-08-03 MED ORDER — HEPARIN SODIUM (PORCINE) 1000 UNIT/ML DIALYSIS: 2000.0000 [IU] | INTRAMUSCULAR | Status: DC | PRN

## 2022-08-03 MED ORDER — TRAZODONE HCL 50 MG PO TABS
25.0000 mg | ORAL_TABLET | Freq: Every evening | ORAL | Status: DC | PRN
  Administered 2022-08-03 – 2022-08-19 (×3): 50 mg via ORAL
  Filled 2022-08-03 (×4): qty 1

## 2022-08-03 MED ORDER — HYDRALAZINE HCL 20 MG/ML IJ SOLN
10.0000 mg | Freq: Four times a day (QID) | INTRAMUSCULAR | Status: DC | PRN
  Administered 2022-08-10: 10 mg via INTRAVENOUS
  Filled 2022-08-03: qty 1

## 2022-08-03 MED ORDER — FENTANYL CITRATE (PF) 100 MCG/2ML IJ SOLN
INTRAMUSCULAR | Status: AC
  Filled 2022-08-03: qty 4

## 2022-08-03 MED ORDER — MIDAZOLAM HCL 2 MG/2ML IJ SOLN
INTRAMUSCULAR | Status: AC
  Filled 2022-08-03: qty 4

## 2022-08-03 MED ORDER — SODIUM CHLORIDE 0.9 % IV SOLN
INTRAVENOUS | Status: AC | PRN
  Administered 2022-08-03: 10 mL/h via INTRAVENOUS

## 2022-08-03 MED ORDER — URSODIOL 300 MG PO CAPS
300.0000 mg | ORAL_CAPSULE | Freq: Two times a day (BID) | ORAL | Status: DC
  Administered 2022-08-04 – 2022-08-26 (×38): 300 mg via ORAL
  Filled 2022-08-03 (×49): qty 1

## 2022-08-03 MED ORDER — URSODIOL 250 MG PO TABS: 250.0000 mg | ORAL_TABLET | Freq: Two times a day (BID) | ORAL | Status: DC

## 2022-08-03 MED ORDER — MIDAZOLAM HCL 2 MG/2ML IJ SOLN
INTRAMUSCULAR | Status: AC | PRN
  Administered 2022-08-03 (×2): 1 mg via INTRAVENOUS

## 2022-08-03 MED ORDER — GABAPENTIN 100 MG PO CAPS
100.0000 mg | ORAL_CAPSULE | Freq: Every day | ORAL | Status: DC
  Administered 2022-08-03 – 2022-08-05 (×3): 100 mg via ORAL
  Filled 2022-08-03 (×4): qty 1

## 2022-08-03 MED ORDER — POTASSIUM CHLORIDE 10 MEQ/100ML IV SOLN
10.0000 meq | INTRAVENOUS | Status: AC
  Administered 2022-08-03 (×2): 10 meq via INTRAVENOUS
  Filled 2022-08-03 (×2): qty 100

## 2022-08-03 MED ORDER — HEPARIN SODIUM (PORCINE) 1000 UNIT/ML DIALYSIS
2000.0000 [IU] | Freq: Once | INTRAMUSCULAR | Status: AC
  Administered 2022-08-04: 2000 [IU] via INTRAVENOUS_CENTRAL
  Filled 2022-08-03: qty 2

## 2022-08-03 MED ORDER — SODIUM CHLORIDE 0.9% FLUSH
5.0000 mL | Freq: Three times a day (TID) | INTRAVENOUS | Status: DC
  Administered 2022-08-03 – 2022-08-20 (×45): 5 mL

## 2022-08-03 MED ORDER — CARVEDILOL 12.5 MG PO TABS: 12.5000 mg | ORAL_TABLET | ORAL | Status: DC

## 2022-08-03 MED ORDER — CHLORHEXIDINE GLUCONATE CLOTH 2 % EX PADS
6.0000 | MEDICATED_PAD | Freq: Every day | CUTANEOUS | Status: DC
  Administered 2022-08-04 – 2022-08-16 (×12): 6 via TOPICAL

## 2022-08-03 MED ORDER — URSODIOL 60 MG/ML SUSP
250.0000 mg | Freq: Two times a day (BID) | ORAL | Status: DC
  Filled 2022-08-03: qty 4.2

## 2022-08-03 MED ORDER — ISOSORB DINITRATE-HYDRALAZINE 20-37.5 MG PO TABS
1.0000 | ORAL_TABLET | ORAL | Status: DC
  Administered 2022-08-04 – 2022-08-20 (×18): 1 via ORAL
  Filled 2022-08-03 (×23): qty 1

## 2022-08-03 MED ORDER — CARVEDILOL 12.5 MG PO TABS
12.5000 mg | ORAL_TABLET | ORAL | Status: DC
  Administered 2022-08-03 – 2022-08-21 (×6): 12.5 mg via ORAL
  Filled 2022-08-03 (×3): qty 1

## 2022-08-03 MED ORDER — BUSPIRONE HCL 5 MG PO TABS
7.5000 mg | ORAL_TABLET | Freq: Every day | ORAL | Status: DC | PRN
  Administered 2022-08-16: 7.5 mg via ORAL
  Filled 2022-08-03 (×2): qty 2

## 2022-08-03 MED ORDER — CARVEDILOL 12.5 MG PO TABS
12.5000 mg | ORAL_TABLET | ORAL | Status: DC
  Administered 2022-08-04 – 2022-08-20 (×15): 12.5 mg via ORAL
  Filled 2022-08-03 (×23): qty 1

## 2022-08-03 MED ORDER — ONDANSETRON HCL 4 MG PO TABS
4.0000 mg | ORAL_TABLET | Freq: Four times a day (QID) | ORAL | Status: DC | PRN
  Administered 2022-08-03: 4 mg via ORAL
  Filled 2022-08-03: qty 1

## 2022-08-03 MED ORDER — ISOSORB DINITRATE-HYDRALAZINE 20-37.5 MG PO TABS
1.0000 | ORAL_TABLET | ORAL | Status: DC
  Administered 2022-08-03 – 2022-08-21 (×6): 1 via ORAL
  Filled 2022-08-03 (×4): qty 1

## 2022-08-03 MED ORDER — LIDOCAINE HCL (PF) 1 % IJ SOLN
10.0000 mL | Freq: Once | INTRAMUSCULAR | Status: AC
  Administered 2022-08-03: 10 mL via INTRADERMAL

## 2022-08-03 MED ORDER — ISOSORB DINITRATE-HYDRALAZINE 20-37.5 MG PO TABS: 1.0000 | ORAL_TABLET | ORAL | Status: DC

## 2022-08-03 NOTE — ED Notes (Signed)
Patient is very drowsy but can't stay awake to take PO meds at this time.  VSS.  Will have PM RN attempt to get 1800 med

## 2022-08-03 NOTE — Progress Notes (Signed)
Subjective/Chief Complaint: Some ab pain, nausea   Objective: Vital signs in last 24 hours: Temp:  [98.2 F (36.8 C)-99 F (37.2 C)] 99 F (37.2 C) (01/08 0437) Pulse Rate:  [89-113] 95 (01/08 0740) Resp:  [11-24] 20 (01/08 0740) BP: (131-187)/(44-103) 187/67 (01/08 0715) SpO2:  [90 %-100 %] 99 % (01/08 0740) Weight:  [63.5 kg] 63.5 kg (01/08 0032) Last BM Date : 08/01/22  Intake/Output from previous day: 01/07 0701 - 01/08 0700 In: 100 [IV Piggyback:100] Out: -  Intake/Output this shift: Total I/O In: 100 [IV Piggyback:100] Out: -   Ab soft tender rlq without peritoneal signs  Lab Results:  Recent Labs    08/19/2022 1730 08/03/22 0008 08/03/22 0422  WBC 19.6*  --  21.7*  HGB 11.6* 10.9* 10.9*  HCT 36.5 34.2* 32.7*  PLT 243  --  264   BMET Recent Labs    08/08/2022 1730 08/03/22 0422  NA 129* 130*  K 3.3* 3.2*  CL 88* 92*  CO2 25 26  GLUCOSE 250* 165*  BUN 21 23  CREATININE 6.42* 6.91*  CALCIUM 9.7 9.2   PT/INR Recent Labs    08/03/22 0422  LABPROT 13.3  INR 1.0   ABG No results for input(s): "PHART", "HCO3" in the last 72 hours.  Invalid input(s): "PCO2", "PO2"  Studies/Results: CT ABDOMEN PELVIS W CONTRAST  Result Date: 08/26/2022 CLINICAL DATA:  Right groin and right abdominal pain. EXAM: CT ABDOMEN AND PELVIS WITH CONTRAST TECHNIQUE: Multidetector CT imaging of the abdomen and pelvis was performed using the standard protocol following bolus administration of intravenous contrast. RADIATION DOSE REDUCTION: This exam was performed according to the departmental dose-optimization program which includes automated exposure control, adjustment of the mA and/or kV according to patient size and/or use of iterative reconstruction technique. CONTRAST:  16m OMNIPAQUE IOHEXOL 350 MG/ML SOLN COMPARISON:  07/31/2022 FINDINGS: Lower chest: No acute abnormality. Hepatobiliary: Cholelithiasis without pericholecystic inflammatory change noted. The liver is  unremarkable. No intra or extrahepatic biliary ductal dilation. Pancreas: Unremarkable Spleen: Unremarkable Adrenals/Urinary Tract: The adrenal glands are unremarkable. The kidneys are normal in position. There is mild to moderate renal cortical atrophy bilaterally. Multiple simple cortical cysts are seen within the kidneys bilaterally. Within the upper pole of the left kidney, however, a hypoenhancing 2.1 cm lesion is identified suspicious for AA primary renal neoplasm. Vascular calcifications noted within the renal hila bilaterally. No definite urinary renal or ureteral calculi. No hydronephrosis. The bladder is unremarkable. Stomach/Bowel: The stomach, small bowel, and large bowel are unremarkable. The previously noted loculated collection along the right pelvic sidewall is again identified and is enlarged, measuring 3.9 x 5.3 cm at axial image # 69/3. As noted on prior examination, there is irregular mural enhancement, enhancing internal septa, and mild surrounding inflammatory stranding. Additionally, on the current examination, there has developed gas within the superior aspect of the mass, best seen on axial image # 68/3. When compared to remote prior examination of 10/15/2020, this is seen immediately adjacent to the appendix and complex right adnexal cystic lesion. Altogether, the findings are suspicious for a perforated appendicitis with a periappendiceal abscess with secondary involvement of the previously noted adnexal mass or a tubo-ovarian abscess. A malignant lesion is considered less likely given its relatively rapid evolution though a secondarily infected malignancy could appear in this fashion. No free intraperitoneal gas or fluid. No evidence of obstruction. Vascular/Lymphatic: Extensive aortoiliac atherosclerotic calcification. Extensive visceral arteriosclerosis. 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 No pathologic adenopathy within the abdomen and pelvis. Reproductive: Uterus absent.  No adnexal masses on the left. Other: Small right femoral hernia is again identified though bowel within the hernia sac noted on prior examination has spontaneously reduced in the interval. Musculoskeletal: Degenerative changes are seen within the lumbar spine. No acute bone abnormality. No lytic or blastic bone lesion. IMPRESSION: 1. Interval enlargement of the previously noted loculated collection along the right pelvic sidewall with irregular mural enhancement, enhancing internal septa, and mild surrounding inflammatory stranding. Additionally, there has developed gas within the superior aspect of the mass, best seen on axial image # 68/3. When compared to remote prior examination of 10/15/2020, this is seen immediately adjacent to the appendix and complex right adnexal cystic lesion. Altogether, the findings are suspicious for a perforated appendicitis with a periappendiceal abscess with probable secondary involvement of the previously noted adnexal mass or a tubo-ovarian abscess. A malignant lesion is considered less likely given its relatively rapid evolution though a secondarily infected malignancy could appear in this fashion. Surgical consultation is recommended. 2. 2.1 cm hypoenhancing lesion within the upper pole of the left kidney suspicious for a primary renal neoplasm. Dedicated renal mass protocol CT or MRI examination is recommended for evaluation once the patient's acute issues have resolved. 3. Cholelithiasis. 4. Small right fat containing femoral hernia. Previously noted bowel within the hernia sac has spontaneously reduced. Aortic Atherosclerosis (ICD10-I70.0). Electronically Signed   By: Fidela Salisbury M.D.   On: 08/08/2022 20:35   US Abdomen Limited RUQ  (LIVER/GB)  Result Date: 08/04/2022 CLINICAL DATA:  Right-sided abdominal pain EXAM: ULTRASOUND ABDOMEN LIMITED RIGHT UPPER QUADRANT COMPARISON:  CT abdomen and pelvis 07/31/2022 FINDINGS: Gallbladder: Small gallstones are present. There is no gallbladder wall thickening or pericholecystic fluid. Sonographic Percell Miller sign is positive per sonographer. Common bile duct: Diameter: 5.1 mm Liver: No focal lesion identified. Increase in parenchymal echogenicity. Portal vein is patent on color Doppler imaging with normal direction of blood flow towards the liver. Other: 1.4 x 2.2 x 1.4 cm right renal cyst. Right kidney appears echogenic. IMPRESSION: 1. Cholelithiasis with positive sonographic Murphy sign. No gallbladder wall thickening or pericholecystic fluid. Findings are equivocal for acute cholecystitis. 2. Increased echogenicity of the liver. This is a nonspecific finding but may be seen in the setting of hepatic steatosis. 3. Echogenic right kidney, suggesting medical renal disease. Electronically Signed   By: Ronney Asters M.D.   On: 08/26/2022 18:35    Anti-infectives: Anti-infectives (From admission, onward)    Start     Dose/Rate Route Frequency Ordered Stop   08/13/2022 2245  piperacillin-tazobactam (ZOSYN) IVPB 2.25 g        2.25 g 100 mL/hr over 30 Minutes Intravenous Every 8 hours 08/10/2022 2215     07/31/2022 1900  piperacillin-tazobactam (ZOSYN) IVPB 3.375 g        3.375 g 100 mL/hr over 30 Minutes Intravenous  Once 07/31/2022 1853 08/10/2022 2049       Assessment/Plan: Pelvic abscess, likely perforated appendicitis -no urgent need for surgery -if can have IR drainage and abx would be preferable, will ask IR to see her -continue zosyn -hold on pharm dvt prophylaxis until IR evaluation  I reviewed hospitalist notes, last 24 h vitals and pain scores, last 48 h intake and output, last 24 h labs and trends, and last 24 h  imaging results.  This care required moderate level of medical decision  making.    Rolm Bookbinder 08/03/2022

## 2022-08-03 NOTE — ED Notes (Signed)
Patient has had several dark stools that are soft since returned from [place] drain placement.

## 2022-08-03 NOTE — ED Notes (Signed)
ED TO INPATIENT HANDOFF REPORT  ED Nurse Name and Phone #: Jinny Blossom [phone]  S Name/Age/Gender Destiny Day 72 y.o. female Room/Bed: 038C/038C  Code Status   Code Status: Full Code  Home/SNF/Other Home Patient oriented to: self, place, time, and situation Is this baseline? Yes   Triage Complete: Triage complete  Chief Complaint Abdominal infection ([place]) [K65.9]  Triage Note Pt here with reports of RLQ abdominal pain for the last week and a half. Seen several days ago for the same, dx with gallstones. MWF dialysis pt.  180/80 285 CBG  HR 120 98% RA   Allergies Allergies  Allergen Reactions   Adhesive  [Tape] Hives   Lactose Intolerance (Gi) Diarrhea   Lisinopril Cough   Prednisone Swelling and Other (See Comments)    Excessive fluid buildup    Level of Care/Admitting Diagnosis ED Disposition     ED Disposition  Admit   Condition  --   Comment  Hospital Area: [facility] [[id number]]  Level of Care: Progressive [102]  Admit to Progressive based on following criteria: MULTISYSTEM THREATS such as stable sepsis, metabolic/electrolyte imbalance with or without encephalopathy that is responding to early treatment.  Admit to Progressive based on following criteria: GI, ENDOCRINE disease patients with GI bleeding, acute liver failure or pancreatitis, stable with diabetic ketoacidosis or thyrotoxicosis (hypothyroid) state.  May admit patient to [facility] or [facility] if equivalent level of care is available:: No  Covid Evaluation: Symptomatic Person Under Investigation (PUI) or recent exposure (last 10 days) *Testing Required*  Diagnosis: Abdominal infection [facility]) [[id number]]  Admitting Physician: Clance Boll [[id number]]  Attending Physician: Clance Boll [[id number]]  Certification:: I certify this patient will need inpatient services for at least 2 midnights  Estimated Length of Stay: 3          B Medical/Surgery History Past  Medical History:  Diagnosis Date   Abdominal bruit    Anxiety    Arthritis    Osteoarthritis   Asthma    Cervical disc disease    "pinced nerve"   CHF (congestive heart failure) ([place])    COPD (chronic obstructive pulmonary disease) ([place])    Diabetes mellitus    Type II   Diverticulitis    ESRD (end stage renal disease) ([place])    dialysis - M/W/F- [place]   GERD (gastroesophageal reflux disease)    from medications   GI bleed 03/31/2013   Head injury 07/2017   History of hiatal hernia    Hyperlipidemia    Hypertension    Neuropathy    left leg   Nuclear sclerotic cataract of right eye 04/23/2020   Osteoporosis    Peripheral vascular disease ([place])    Pneumonia    "very young" and a few years ago   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)    Vitrectomy membrane peel endolaser 05-07-2021   Seasonal allergies    Shortness of breath dyspnea    WIth exertion, when fluid builds   Sleep apnea    can't afford cpap    Vitreomacular traction syndrome, 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   05-07-2021  Vitrectomy, membrane peel, PRP, resection posterior segment of NVD, remnants of peripheral NVE remain in place due to atrophic retina, no vitreous substitute   Past Surgical History:  Procedure Laterality Date   A/V SHUNTOGRAM N/A 09/22/2016   Procedure: A/V Shuntogram - left arm;  Surgeon: Serafina Mitchell, MD;  Location: [place] [facility];  Service: Cardiovascular;  Laterality: N/A;   A/V SHUNTOGRAM N/A 03/22/2018   Procedure: A/V SHUNTOGRAM - left arm;  Surgeon: Serafina Mitchell, MD;  Location: [place] [facility];  Service: Cardiovascular;  Laterality: N/A;   A/V SHUNTOGRAM Left 11/17/2018   Procedure: A/V SHUNTOGRAM;  Surgeon: Angelia Mould, MD;  Location: [place] [facility];  Service: Cardiovascular;  Laterality: Left;   ABDOMINAL HYSTERECTOMY  1993`   AMPUTATION Left 09/01/2016   Procedure: LEFT FOOT TRANSMETATARSAL  AMPUTATION;  Surgeon: Newt Minion, MD;  Location: [place];  Service: Orthopedics;  Laterality: Left;   AMPUTATION Right 08/11/2017   Procedure: RIGHT GREAT TOE AMPUTATION DIGIT;  Surgeon: Rosetta Posner, MD;  Location: [place];  Service: Vascular;  Laterality: Right;   AMPUTATION Right 12/31/2017   Procedure: RIGHT TRANSMETATARSAL AMPUTATION;  Surgeon: Newt Minion, MD;  Location: [place];  Service: Orthopedics;  Laterality: Right;   AV FISTULA PLACEMENT Left 04/21/2016   Procedure: INSERTION OF ARTERIOVENOUS (AV) GORE-TEX GRAFT ARM LEFT;  Surgeon: Elam Dutch, MD;  Location: [place];  Service: Vascular;  Laterality: Left;   BASCILIC VEIN TRANSPOSITION Left 07/10/2014   Procedure: BASCILIC VEIN TRANSPOSITION;  Surgeon: Angelia Mould, MD;  Location: [place];  Service: Vascular;  Laterality: Left;   [place] Right 11/08/2014   Procedure: FIRST STAGE BASILIC VEIN TRANSPOSITION;  Surgeon: Angelia Mould, MD;  Location: [place];  Service: Vascular;  Laterality: Right;   [place] Right 01/18/2015   Procedure: SECOND STAGE BASILIC VEIN TRANSPOSITION;  Surgeon: Angelia Mould, MD;  Location: [place];  Service: Vascular;  Laterality: Right;   CRANIOTOMY N/A 08/23/2017   Procedure: CRANIOTOMY HEMATOMA EVACUATION SUBDURAL;  Surgeon: Ashok Pall, MD;  Location: [place];  Service: Neurosurgery;  Laterality: N/A;   CRANIOTOMY Left 08/24/2017   Procedure: CRANIOTOMY FOR RECURRENT ACUTE SUBDURAL HEMATOMA;  Surgeon: Ashok Pall, MD;  Location: [place];  Service: Neurosurgery;  Laterality: Left;   ESOPHAGOGASTRODUODENOSCOPY N/A 03/31/2013   Procedure: ESOPHAGOGASTRODUODENOSCOPY (EGD);  Surgeon: Gatha Mayer, MD;  Location: [facility] ENDOSCOPY;  Service: Endoscopy;  Laterality: N/A;   EYE SURGERY     laser surgery   FEMORAL-POPLITEAL BYPASS GRAFT Left 07/08/2016   Procedure: LEFT  FEMORAL-BELOW KNEE POPLITEAL ARTERY BYPASS GRAFT USING 6MM X 80 CM PROPATEN GORETEX GRAFT WITH  RINGS.;  Surgeon: Rosetta Posner, MD;  Location: [facility] OR;  Service: Vascular;  Laterality: Left;   FEMORAL-POPLITEAL BYPASS GRAFT Right 08/11/2017   Procedure: RIGHT FEMORAL TO BELOW KNEE POPLITEAL ARTERKY  BYPASS GRAFT USING 6MM RINGED PROPATEN GRAFT;  Surgeon: Rosetta Posner, MD;  Location: [place];  Service: Vascular;  Laterality: Right;   FISTULOGRAM Left 10/29/2014   Procedure: FISTULOGRAM;  Surgeon: Angelia Mould, MD;  Location: [facility] CATH LAB;  Service: Cardiovascular;  Laterality: Left;   GAS/FLUID EXCHANGE Left 12/20/2018   Procedure: AIR GAS EXCHANGE;  Surgeon: Jalene Mullet, MD;  Location: [place];  Service: Ophthalmology;  Laterality: Left;   INSERTION OF DIALYSIS CATHETER N/A 09/05/2020   Procedure: INSERTION OF DIALYSIS CATHETER;  Surgeon: Cherre Robins, MD;  Location: [place];  Service: Vascular;  Laterality: N/A;   INSERTION OF DIALYSIS CATHETER Left 03/13/2021   Procedure: INSERTION OF DIALYSIS CATHETER;  Surgeon: Waynetta Sandy, MD;  Location: [place];  Service: Vascular;  Laterality: Left;   KNEE ARTHROSCOPY Right 05/06/2018   Procedure: RIGHT KNEE ARTHROSCOPY;  Surgeon: Newt Minion, MD;  Location: [place];  Service: Orthopedics;  Laterality: Right;   KNEE ARTHROSCOPY Right 06/10/2018   Procedure: RIGHT KNEE ARTHROSCOPY AND DEBRIDEMENT;  Surgeon: Newt Minion, MD;  Location: [place];  Service: Orthopedics;  Laterality: Right;   LIGATION OF ARTERIOVENOUS  FISTULA Left 04/21/2016   Procedure: LIGATION OF ARTERIOVENOUS  FISTULA LEFT ARM;  Surgeon: Elam Dutch, MD;  Location: [place];  Service: Vascular;  Laterality: Left;   LOWER EXTREMITY ANGIOGRAPHY N/A 04/06/2017   Procedure: Lower Extremity Angiography - Right;  Surgeon: Serafina Mitchell, MD;  Location: [place] [facility];  Service: Cardiovascular;  Laterality: N/A;   MEMBRANE PEEL Left 12/20/2018   Procedure: MEMBRANE PEEL;  Surgeon: Jalene Mullet, MD;  Location: [place];  Service: Ophthalmology;  Laterality: Left;    MEMBRANE PEEL Right 05/29/2022   Procedure: MEMBRANE PEEL;  Surgeon: Hurman Horn, MD;  Location: [place];  Service: Ophthalmology;  Laterality: Right;   PARS PLANA VITRECTOMY Right 05/29/2022   Procedure: PARS PLANA VITRECTOMY WITH 25 GAUGE;  Surgeon: [place]man Horn, MD;  Location: [place];  Service: Ophthalmology;  Laterality: Right;   PATCH ANGIOPLASTY Right 01/18/2015   Procedure: BASILIC VEIN PATCH ANGIOPLASTY USING VASCUGUARD PATCH;  Surgeon: Angelia Mould, MD;  Location: [place];  Service: Vascular;  Laterality: Right;   PERIPHERAL VASCULAR BALLOON ANGIOPLASTY  09/22/2016   Procedure: Peripheral Vascular Balloon Angioplasty;  Surgeon: Serafina Mitchell, MD;  Location: [place] [facility];  Service: Cardiovascular;;  Lt. Fistula   PERIPHERAL VASCULAR BALLOON ANGIOPLASTY Left 03/22/2018   Procedure: PERIPHERAL VASCULAR BALLOON ANGIOPLASTY;  Surgeon: Serafina Mitchell, MD;  Location: [place] [facility];  Service: Cardiovascular;  Laterality: Left;  Arm shunt   PERIPHERAL VASCULAR CATHETERIZATION N/A 06/23/2016   Procedure: Abdominal Aortogram w/Lower Extremity;  Surgeon: Serafina Mitchell, MD;  Location: [place] [facility];  Service: Cardiovascular;  Laterality: N/A;   PERIPHERAL VASCULAR CATHETERIZATION  06/23/2016   Procedure: Peripheral Vascular Intervention;  Surgeon: Serafina Mitchell, MD;  Location: [place] [facility];  Service: Cardiovascular;;  lt common and external illiac artery   PHOTOCOAGULATION WITH LASER Left 12/20/2018   Procedure: PHOTOCOAGULATION WITH LASER;  Surgeon: Jalene Mullet, MD;  Location: [place];  Service: Ophthalmology;  Laterality: Left;   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   Procedure: REPAIR OF COMPLEX TRACTION RETINAL DETACHMENT;  Surgeon: Jalene Mullet, MD;  Location: [place];  Service: Ophthalmology;  Laterality: Left;   REVISION OF ARTERIOVENOUS GORETEX GRAFT Left 09/05/2020   Procedure: REVISION OF ARTERIOVENOUS GORETEX GRAFT LEFT;  Surgeon:  Cherre Robins, MD;  Location: [place];  Service: Vascular;  Laterality: Left;     A IV Location/Drains/Wounds Patient Lines/Drains/Airways Status     Active Line/Drains/Airways     Name Placement date Placement time Site Days   Peripheral IV 07/31/22 20 G 1" Right Forearm 07/31/22  0013  Forearm  3   Peripheral IV 08/15/2022 20 G 1.75" Right Antecubital 08/20/2022  1945  Antecubital  1   Fistula / Graft Right Upper arm Arteriovenous vein graft --  --  Upper arm  --   Fistula / Graft Left Upper arm Arteriovenous vein graft 09/05/20  1259  Upper arm  697   Fistula / Graft Left Upper arm --  --  Upper arm  --   External Urinary Catheter 08/03/22  1032  --  less than 1            Intake/Output Last 24 hours  Intake/Output  Summary (Last 24 hours) at 08/03/2022 1900 Last data filed at 08/03/2022 1553 Gross per 24 hour  Intake 1053.04 ml  Output --  Net 1053.04 ml    Labs/Imaging Results for orders placed or performed during the hospital encounter of 08/05/2022 (from the past 48 hour(s))  CBC with Differential     Status: Abnormal   Collection Time: 08/05/2022  5:30 PM  Result Value Ref Range   WBC 19.6 (H) 4.0 - 10.5 K/uL   RBC 3.65 (L) 3.87 - 5.11 MIL/uL   Hemoglobin 11.6 (L) 12.0 - 15.0 g/dL   HCT 36.5 36.0 - 46.0 %   MCV 100.0 80.0 - 100.0 fL   MCH 31.8 26.0 - 34.0 pg   MCHC 31.8 30.0 - 36.0 g/dL   RDW 12.9 11.5 - 15.5 %   Platelets 243 150 - 400 K/uL   nRBC 0.0 0.0 - 0.2 %   Neutrophils Relative % 90 %   Neutro Abs 17.7 (H) 1.7 - 7.7 K/uL   Lymphocytes Relative 4 %   Lymphs Abs 0.9 0.7 - 4.0 K/uL   Monocytes Relative 4 %   Monocytes Absolute 0.7 0.1 - 1.0 K/uL   Eosinophils Relative 1 %   Eosinophils Absolute 0.1 0.0 - 0.5 K/uL   Basophils Relative 0 %   Basophils Absolute 0.0 0.0 - 0.1 K/uL   Immature Granulocytes 1 %   Abs Immature Granulocytes 0.21 (H) 0.00 - 0.07 K/uL    Comment: Performed at [facility], [address], [place], [place] [address]   Comprehensive metabolic panel     Status: Abnormal   Collection Time: 07/30/2022  5:30 PM  Result Value Ref Range   Sodium 129 (L) 135 - 145 mmol/L   Potassium 3.3 (L) 3.5 - 5.1 mmol/L   Chloride 88 (L) 98 - 111 mmol/L   CO2 25 22 - 32 mmol/L   Glucose, Bld 250 (H) 70 - 99 mg/dL    Comment: Glucose reference range applies only to samples taken after fasting for at least 8 hours.   BUN 21 8 - 23 mg/dL   Creatinine, Ser 6.42 (H) 0.44 - 1.00 mg/dL   Calcium 9.7 8.9 - 10.3 mg/dL   Total Protein 6.3 (L) 6.5 - 8.1 g/dL   Albumin 2.7 (L) 3.5 - 5.0 g/dL   AST 19 15 - 41 U/L   ALT 9 0 - 44 U/L   Alkaline Phosphatase 93 38 - 126 U/L   Total Bilirubin 0.6 0.3 - 1.2 mg/dL   GFR, Estimated 6 (L) >60 mL/min    Comment: (NOTE) Calculated using the CKD-EPI Creatinine Equation (2021)    Anion gap 16 (H) 5 - 15    Comment: Performed at [facility], [place] [address], [place], [place] [address]  Lipase, blood     Status: None   Collection Time: 08/01/2022  5:30 PM  Result Value Ref Range   Lipase 32 11 - 51 U/L    Comment: Performed at [facility] [address], [place], [place] [address]  Lactic acid, plasma     Status: None   Collection Time: 08/03/22 12:08 AM  Result Value Ref Range   Lactic Acid, Venous 1.1 0.5 - 1.9 mmol/L    Comment: Performed at [facility] [address] [place], [place] [address]  Procalcitonin - Baseline     Status: None   Collection Time: 08/03/22 12:08 AM  Result Value Ref Range  Procalcitonin 2.03 ng/mL    Comment:        Interpretation: PCT > 2 ng/mL: Systemic infection (sepsis) is likely, unless other causes are known. (NOTE)       Sepsis PCT Algorithm           Lower Respiratory Tract                                      Infection PCT Algorithm    ----------------------------     ----------------------------         PCT < 0.25 ng/mL                PCT < 0.10 ng/mL          Strongly encourage             Strongly discourage    discontinuation of antibiotics    initiation of antibiotics    ----------------------------     -----------------------------       PCT 0.25 - 0.50 ng/mL            PCT 0.10 - 0.25 ng/mL               OR       >80% decrease in PCT            Discourage initiation of                                            antibiotics      Encourage discontinuation           of antibiotics    ----------------------------     -----------------------------         PCT >= 0.50 ng/mL              PCT 0.26 - 0.50 ng/mL               AND       <80% decrease in PCT              Encourage initiation of                                             antibiotics       Encourage continuation           of antibiotics    ----------------------------     -----------------------------        PCT >= 0.50 ng/mL                  PCT > 0.50 ng/mL               AND         increase in PCT                  Strongly encourage                                      initiation of antibiotics    Strongly encourage escalation           of antibiotics                                     -----------------------------  PCT <= 0.25 ng/mL                                                 OR                                        > 80% decrease in PCT                                      Discontinue / Do not initiate                                             antibiotics  Performed at [facility], [place] [address] [place], [place] [address]   C-reactive protein     Status: Abnormal   Collection Time: 08/03/22 12:08 AM  Result Value Ref Range   CRP 20.4 (H) <1.0 mg/dL    Comment: Performed at [facility] [address] [place], [place] [address]  Brain natriuretic peptide     Status: Abnormal   Collection Time: 08/03/22 12:08 AM  Result Value Ref Range   B Natriuretic Peptide 1,676.4 (H) 0.0 - 100.0 pg/mL    Comment: Performed at [facility] [address]  [place], [place] [address]  Vitamin B12     Status: None   Collection Time: 08/03/22 12:08 AM  Result Value Ref Range   Vitamin B-12 421 180 - 914 pg/mL    Comment: (NOTE) This assay is not validated for testing neonatal or myeloproliferative syndrome specimens for Vitamin B12 levels. Performed at [facility], [place] [address], [place], [place] [address]   Folate     Status: None   Collection Time: 08/03/22 12:08 AM  Result Value Ref Range   Folate 11.2 >5.9 ng/mL    Comment: Performed at [facility] [address] [place], [place] [address]  Iron and TIBC     Status: Abnormal   Collection Time: 08/03/22 12:08 AM  Result Value Ref Range   Iron 26 (L) 28 - 170 ug/dL   TIBC 125 (L) 250 - 450 ug/dL   Saturation Ratios 21 10.4 - 31.8 %   UIBC 99 ug/dL    Comment: Performed at [facility], [place] [address], [place], [place] [address]  Ferritin     Status: Abnormal   Collection Time: 08/03/22 12:08 AM  Result Value Ref Range   Ferritin 1,457 (H) 11 - 307 ng/mL    Comment: Performed at [facility], [place] [address], [place], [place] [address]  Reticulocytes     Status: Abnormal   Collection Time: 08/03/22 12:08 AM  Result Value Ref Range   Retic Ct Pct 1.3 0.4 - 3.1 %   RBC. 3.44 (L) 3.87 - 5.11 MIL/uL   Retic Count, Absolute 45.1 19.0 - 186.0 K/uL   Immature Retic Fract 13.4 2.3 - 15.9 %    Comment: Performed at [facility] [address], [place],  [address]  Hemoglobin and hematocrit,  blood     Status: Abnormal   Collection Time: 08/03/22 12:08 AM  Result Value Ref Range   Hemoglobin 10.9 (L) 12.0 - 15.0 g/dL   HCT 34.2 (L) 36.0 - 46.0 %    Comment: Performed at [facility] [address], [place], [place] [address]  Magnesium     Status: None   Collection Time: 08/03/22 12:08 AM  Result Value Ref Range   Magnesium 1.8 1.7 - 2.4 mg/dL    Comment: Performed at [facility] [address], [place], [place] [address]  TSH      Status: None   Collection Time: 08/03/22 12:08 AM  Result Value Ref Range   TSH 0.760 0.350 - 4.500 uIU/mL    Comment: Performed by a 3rd Generation assay with a functional sensitivity of <=0.01 uIU/mL. Performed at [facility], [place] [address] [place], [place] [address]   CBG monitoring, ED     Status: Abnormal   Collection Time: 08/03/22 12:24 AM  Result Value Ref Range   Glucose-Capillary 149 (H) 70 - 99 mg/dL    Comment: Glucose reference range applies only to samples taken after fasting for at least 8 hours.  Procalcitonin     Status: None   Collection Time: 08/03/22  4:22 AM  Result Value Ref Range   Procalcitonin 1.62 ng/mL    Comment:        Interpretation: PCT > 0.5 ng/mL and <= 2 ng/mL: Systemic infection (sepsis) is possible, but other conditions are known to elevate PCT as well. (NOTE)       Sepsis PCT Algorithm           Lower Respiratory Tract                                      Infection PCT Algorithm    ----------------------------     ----------------------------         PCT < 0.25 ng/mL                PCT < 0.10 ng/mL          Strongly encourage             Strongly discourage   discontinuation of antibiotics    initiation of antibiotics    ----------------------------     -----------------------------       PCT 0.25 - 0.50 ng/mL            PCT 0.10 - 0.25 ng/mL               OR       >80% decrease in PCT            Discourage initiation of                                            antibiotics      Encourage discontinuation           of antibiotics    ----------------------------     -----------------------------         PCT >= 0.50 ng/mL              PCT 0.26 - 0.50 ng/mL  AND       <80% decrease in PCT             Encourage initiation of                                             antibiotics       Encourage continuation           of antibiotics    ----------------------------     -----------------------------        PCT >= 0.50  ng/mL                  PCT > 0.50 ng/mL               AND         increase in PCT                  Strongly encourage                                      initiation of antibiotics    Strongly encourage escalation           of antibiotics                                     -----------------------------                                           PCT <= 0.25 ng/mL                                                 OR                                        > 80% decrease in PCT                                      Discontinue / Do not initiate                                             antibiotics  Performed at [facility], [place] [address] [place], [place] [address]   Comprehensive metabolic panel     Status: Abnormal   Collection Time: 08/03/22  4:22 AM  Result Value Ref Range   Sodium 130 (L) 135 - 145 mmol/L   Potassium 3.2 (L) 3.5 - 5.1 mmol/L   Chloride 92 (L) 98 - 111 mmol/L   CO2 26 22 - 32 mmol/L   Glucose, Bld 165 (H) 70 - 99 mg/dL    Comment: Glucose reference range applies only to samples taken after fasting for at least 8  hours.   BUN 23 8 - 23 mg/dL   Creatinine, Ser 6.91 (H) 0.44 - 1.00 mg/dL   Calcium 9.2 8.9 - 10.3 mg/dL   Total Protein 5.9 (L) 6.5 - 8.1 g/dL   Albumin 2.3 (L) 3.5 - 5.0 g/dL   AST 15 15 - 41 U/L   ALT 9 0 - 44 U/L   Alkaline Phosphatase 86 38 - 126 U/L   Total Bilirubin 0.6 0.3 - 1.2 mg/dL   GFR, Estimated 6 (L) >60 mL/min    Comment: (NOTE) Calculated using the CKD-EPI Creatinine Equation (2021)    Anion gap 12 5 - 15    Comment: Performed at [facility], [place] [address] [place], [place] [address]  CBC     Status: Abnormal   Collection Time: 08/03/22  4:22 AM  Result Value Ref Range   WBC 21.7 (H) 4.0 - 10.5 K/uL   RBC 3.37 (L) 3.87 - 5.11 MIL/uL   Hemoglobin 10.9 (L) 12.0 - 15.0 g/dL   HCT 32.7 (L) 36.0 - 46.0 %   MCV 97.0 80.0 - 100.0 fL   MCH 32.3 26.0 - 34.0 pg   MCHC 33.3 30.0 - 36.0 g/dL   RDW 13.0 11.5 - 15.5 %    Platelets 264 150 - 400 K/uL   nRBC 0.0 0.0 - 0.2 %    Comment: Performed at [facility], [place] [address], [place], [place] [address]  Protime-INR     Status: None   Collection Time: 08/03/22  4:22 AM  Result Value Ref Range   Prothrombin Time 13.3 11.4 - 15.2 seconds   INR 1.0 0.8 - 1.2    Comment: (NOTE) INR goal varies based on device and disease states. Performed at [facility], [place] [address], [place], [place] [address]   CBG monitoring, ED     Status: Abnormal   Collection Time: 08/03/22  8:20 AM  Result Value Ref Range   Glucose-Capillary 151 (H) 70 - 99 mg/dL    Comment: Glucose reference range applies only to samples taken after fasting for at least 8 hours.  Aerobic/Anaerobic Culture w Gram Stain (surgical/deep wound)     Status: None (Preliminary result)   Collection Time: 08/03/22  1:26 PM   Specimen: Wound; Abscess  Result Value Ref Range   Specimen Description WOUND    Special Requests  ABCESS ABDOMEN    Gram Stain      ABUNDANT WBC PRESENT, PREDOMINANTLY PMN ABUNDANT GRAM NEGATIVE RODS FEW GRAM POSITIVE COCCI IN PAIRS IN CHAINS FEW GRAM POSITIVE RODS Performed at [facility], [place] [address], [place], [place] [address]    Culture PENDING    Report Status PENDING   CBG monitoring, ED     Status: None   Collection Time: 08/03/22  1:36 PM  Result Value Ref Range   Glucose-Capillary 96 70 - 99 mg/dL    Comment: Glucose reference range applies only to samples taken after fasting for at least 8 hours.  CBG monitoring, ED     Status: Abnormal   Collection Time: 08/03/22  5:39 PM  Result Value Ref Range   Glucose-Capillary 121 (H) 70 - 99 mg/dL    Comment: Glucose reference range applies only to samples taken after fasting for at least 8 hours.   *Note: Due to a large number of results and/or encounters for the requested time period, some results have not been displayed. A complete set of results can be found  in Results Review.   CT GUIDED  PERITONEAL/RETROPERITONEAL FLUID DRAIN BY PERC CATH  Result Date: 08/03/2022 INDICATION: 72 year old female referred for drainage right pelvic abscess EXAM: CT-GUIDED DRAINAGE RIGHT PELVIC ABSCESS TECHNIQUE: Multidetector CT imaging of the pelvis was performed following the standard protocol without IV contrast. RADIATION DOSE REDUCTION: This exam was performed according to the departmental dose-optimization program which includes automated exposure control, adjustment of the mA and/or kV according to patient size and/or use of iterative reconstruction technique. MEDICATIONS: The patient is currently admitted to the hospital and receiving intravenous antibiotics. The antibiotics were administered within an appropriate time frame prior to the initiation of the procedure. ANESTHESIA/SEDATION: Moderate (conscious) sedation was employed during this procedure. A total of Versed 1.0 mg and Fentanyl 50 mcg was administered intravenously by the radiology nurse. Total intra-service moderate Sedation Time: 20 minutes. The patient's level of consciousness and vital signs were monitored continuously by radiology nursing throughout the procedure under my direct supervision. COMPLICATIONS: None PROCEDURE: Informed written consent was obtained from the patient after a thorough discussion of the procedural risks, benefits and alternatives. All questions were addressed. Maximal Sterile Barrier Technique was utilized including caps, mask, sterile gowns, sterile gloves, sterile drape, hand hygiene and skin antiseptic. A timeout was performed prior to the initiation of the procedure. Patient was position supine on the CT gantry table. Scout CT was acquired for planning purposes. The patient was then prepped and draped in usual sterile fashion. 1% lidocaine was used for local anesthesia. A small stab incision was made with 11 blade scalpel. Trocar needle was then advanced into the abscess associated with the right adnexa. Using  modified Seldinger technique, 10 French drain was placed. Approximately 30 cc of frankly purulent material aspirated. Culture was sent for analysis. Drain was sutured in position and attached to bulb suction. Patient tolerated the procedure well and remained hemodynamically stable throughout. No complications were encountered and no significant blood loss. IMPRESSION: Status post CT-guided drainage of right pelvic abscess. Signed, Dulcy Fanny. Nadene Rubins, RPVI [facility] Electronically Signed   By: Corrie Mckusick D.O.   On: 08/03/2022 15:27   CT ABDOMEN PELVIS W CONTRAST  Result Date: 08/17/2022 CLINICAL DATA:  Right groin and right abdominal pain. EXAM: CT ABDOMEN AND PELVIS WITH CONTRAST TECHNIQUE: Multidetector CT imaging of the abdomen and pelvis was performed using the standard protocol following bolus administration of intravenous contrast. RADIATION DOSE REDUCTION: This exam was performed according to the departmental dose-optimization program which includes automated exposure control, adjustment of the mA and/or kV according to patient size and/or use of iterative reconstruction technique. CONTRAST:  61m OMNIPAQUE IOHEXOL 350 MG/ML SOLN COMPARISON:  07/31/2022 FINDINGS: Lower chest: No acute abnormality. Hepatobiliary: Cholelithiasis without pericholecystic inflammatory change noted. The liver is unremarkable. No intra or extrahepatic biliary ductal dilation. Pancreas: Unremarkable Spleen: Unremarkable Adrenals/Urinary Tract: The adrenal glands are unremarkable. The kidneys are normal in position. There is mild to moderate renal cortical atrophy bilaterally. Multiple simple cortical cysts are seen within the kidneys bilaterally. Within the upper pole of the left kidney, however, a hypoenhancing 2.1 cm lesion is identified suspicious for AA primary renal neoplasm. Vascular calcifications noted within the renal hila bilaterally. No definite  urinary renal or ureteral calculi. No hydronephrosis. The bladder is unremarkable. Stomach/Bowel: The stomach, small bowel, and large bowel are unremarkable. The previously noted loculated collection along the right pelvic sidewall is again identified and is enlarged, measuring 3.9 x 5.3 cm at axial image #  69/3. As noted on prior examination, there is irregular mural enhancement, enhancing internal septa, and mild surrounding inflammatory stranding. Additionally, on the current examination, there has developed gas within the superior aspect of the mass, best seen on axial image # 68/3. When compared to remote prior examination of 10/15/2020, this is seen immediately adjacent to the appendix and complex right adnexal cystic lesion. Altogether, the findings are suspicious for a perforated appendicitis with a periappendiceal abscess with secondary involvement of the previously noted adnexal mass or a tubo-ovarian abscess. A malignant lesion is considered less likely given its relatively rapid evolution though a secondarily infected malignancy could appear in this fashion. No free intraperitoneal gas or fluid. No evidence of obstruction. Vascular/Lymphatic: Extensive aortoiliac atherosclerotic calcification. Extensive visceral arteriosclerosis. 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 No pathologic adenopathy within the abdomen and pelvis. Reproductive: Uterus absent.  No adnexal masses on the left. Other: Small right femoral hernia is again identified though bowel within the hernia sac noted on prior examination has spontaneously reduced in the interval. Musculoskeletal: Degenerative changes are seen within the lumbar spine. No acute bone abnormality. No lytic or blastic bone lesion.  IMPRESSION: 1. Interval enlargement of the previously noted loculated collection along the right pelvic sidewall with irregular mural enhancement, enhancing internal septa, and mild surrounding inflammatory stranding. Additionally, there has developed gas within the superior aspect of the mass, best seen on axial image # 68/3. When compared to remote prior examination of 10/15/2020, this is seen immediately adjacent to the appendix and complex right adnexal cystic lesion. Altogether, the findings are suspicious for a perforated appendicitis with a periappendiceal abscess with probable secondary involvement of the previously noted adnexal mass or a tubo-ovarian abscess. A malignant lesion is considered less likely given its relatively rapid evolution though a secondarily infected malignancy could appear in this fashion. Surgical consultation is recommended. 2. 2.1 cm hypoenhancing lesion within the upper pole of the left kidney suspicious for a primary renal neoplasm. Dedicated renal mass protocol CT or MRI examination is recommended for evaluation once the patient's acute issues have resolved. 3. Cholelithiasis. 4. Small right fat containing femoral hernia. Previously noted bowel within the hernia sac has spontaneously reduced. Aortic Atherosclerosis (ICD10-I70.0). Electronically Signed   By: Fidela Salisbury M.D.   On: 08/23/2022 20:35   US Abdomen Limited RUQ (LIVER/GB)  Result Date: 08/11/2022 CLINICAL DATA:  Right-sided abdominal pain EXAM: ULTRASOUND ABDOMEN LIMITED RIGHT UPPER QUADRANT COMPARISON:  CT abdomen and pelvis 07/31/2022 FINDINGS: Gallbladder: Small gallstones are present. There is no gallbladder wall thickening or pericholecystic fluid. Sonographic Percell Miller sign is positive per sonographer. Common bile duct: Diameter: 5.1 mm Liver: No focal lesion identified. Increase in parenchymal echogenicity. Portal vein is patent on color Doppler imaging with normal direction of blood flow towards the liver.  Other: 1.4 x 2.2 x 1.4 cm right renal cyst. Right kidney appears echogenic. IMPRESSION: 1. Cholelithiasis with positive sonographic Murphy sign. No gallbladder wall thickening or pericholecystic fluid. Findings are equivocal for acute cholecystitis. 2. Increased echogenicity of the liver. This is a nonspecific finding but may be seen in the setting of hepatic steatosis. 3. Echogenic right kidney, suggesting medical renal disease. Electronically Signed   By: Ronney Asters M.D.   On: 08/23/2022 18:35    Pending Labs Unresulted Labs (From admission, onward)  Start     Ordered   08/04/22 0500  Comprehensive metabolic panel  Tomorrow morning,   R        08/03/22 1029   08/04/22 0500  CBC  Tomorrow morning,   R        08/03/22 1029   08/04/22 0500  Magnesium  Tomorrow morning,   R        08/03/22 1029   08/03/22 0500  Procalcitonin  Daily,   R      08/26/2022 2118   08/03/2022 2214  Hemoglobin A1c  Once,   R        08/03/2022 2215   08/26/2022 2211  Hemoglobin and hematocrit, blood  Now then every 6 hours,   R (with TIMED occurrences)      08/07/2022 2215   07/31/2022 2210  Blood gas, venous  Once,   R        08/14/2022 2215   07/30/2022 2122  Hemoglobin A1c  Once,   R       Comments: To assess prior glycemic control    07/28/2022 2121   08/06/2022 2119  Lactic acid, plasma  STAT Now then every 3 hours,   R (with STAT occurrences)      08/04/2022 2118   Unscheduled  Occult blood card to lab, stool  As needed,   R      08/20/2022 2215            Vitals/Pain Today's Vitals   08/03/22 1630 08/03/22 1700 08/03/22 1730 08/03/22 1820  BP: (!) 153/42 (!) 124/90 (!) 109/45   Pulse: (!) 102 97 98   Resp: '13 14 14   '$ Temp:    98.7 F (37.1 C)  TempSrc:    Oral  SpO2: 95% 98% 95%   Weight:      Height:      PainSc: Asleep       Isolation Precautions No active isolations  Medications Medications  oxyCODONE (Oxy IR/ROXICODONE) immediate release tablet 5 mg (has no administration in time range)   oxyCODONE (Oxy IR/ROXICODONE) immediate release tablet 10 mg (10 mg Oral Given 08/03/22 1426)  HYDROmorphone (DILAUDID) injection 0.5 mg (0.5 mg Intravenous Given 08/03/22 1418)  insulin aspart (novoLOG) injection 0-6 Units ( Subcutaneous Not Given 08/03/22 1740)  pantoprazole (PROTONIX) injection 40 mg (40 mg Intravenous Given 08/03/22 0923)  fluticasone furoate-vilanterol (BREO ELLIPTA) 200-25 MCG/ACT 1 puff (1 puff Inhalation Given 08/03/22 1036)  fluticasone (FLONASE) 50 MCG/ACT nasal spray 2 spray (has no administration in time range)  acetaminophen (OFIRMEV) IV 1,000 mg (has no administration in time range)  fentaNYL (SUBLIMAZE) injection 12.5-50 mcg (50 mcg Intravenous Given 08/03/22 1014)  albuterol (PROVENTIL) (2.5 MG/3ML) 0.083% nebulizer solution 2.5 mg (has no administration in time range)  piperacillin-tazobactam (ZOSYN) IVPB 2.25 g (0 g Intravenous Stopped 08/03/22 1553)  hydrALAZINE (APRESOLINE) injection 10 mg (has no administration in time range)  busPIRone (BUSPAR) tablet 7.5 mg (has no administration in time range)  traZODone (DESYREL) tablet 25-50 mg (has no administration in time range)  ondansetron (ZOFRAN) tablet 4 mg (has no administration in time range)  gabapentin (NEURONTIN) capsule 100 mg (has no administration in time range)  atorvastatin (LIPITOR) tablet 40 mg (has no administration in time range)  carvedilol (COREG) tablet 12.5 mg (12.5 mg Oral Given 08/03/22 1426)  carvedilol (COREG) tablet 12.5 mg (has no administration in time range)  isosorbide-hydrALAZINE (BIDIL) 20-37.5 MG per tablet 1 tablet (has no administration in time range)  isosorbide-hydrALAZINE (BIDIL)  20-37.5 MG per tablet 1 tablet (1 tablet Oral Given 08/03/22 1426)  Chlorhexidine Gluconate Cloth 2 % PADS 6 each (0 each Topical Hold 08/03/22 1411)  heparin injection 2,000 Units (0 Units Dialysis Hold 08/03/22 1743)  heparin injection 2,000 Units (has no administration in time range)  sodium chloride flush (NS) 0.9 %  injection 5 mL (5 mLs Intracatheter Not Given 08/03/22 1742)  ursodiol (ACTIGALL) capsule 300 mg (0 mg Oral Hold 08/03/22 1820)  HYDROmorphone (DILAUDID) injection 0.5 mg (0.5 mg Intravenous Given 08/12/2022 1951)  piperacillin-tazobactam (ZOSYN) IVPB 3.375 g (0 g Intravenous Stopped 07/27/2022 2049)  iohexol (OMNIPAQUE) 350 MG/ML injection 75 mL (75 mLs Intravenous Contrast Given 08/07/2022 2007)  potassium chloride 10 mEq in 100 mL IVPB (0 mEq Intravenous Stopped 08/03/22 0851)  0.9 %  sodium chloride infusion (0 mLs Intravenous Stopped 08/03/22 1553)  midazolam (VERSED) injection (1 mg Intravenous Given 08/03/22 1302)  fentaNYL (SUBLIMAZE) injection (25 mcg Intravenous Given 08/03/22 1317)  lidocaine (PF) (XYLOCAINE) 1 % injection 10 mL (10 mLs Intradermal Given 08/03/22 1331)    Mobility walks with device Moderate fall risk   Focused Assessments Dialysis patient with appendicitis with abscess had JP drain placed today.  Flush with 5cc q 8hrs.  ACHS LUA graft +bruit thrill Sleep since receiving pain med after procedure. Had 3 soft BM after procedure.     R Recommendations: See Admitting Provider Note  Report given to:   Additional Notes: n/a

## 2022-08-03 NOTE — Consult Note (Signed)
Chief Complaint: RLQ abdominal pain found to have pelvic fluid collection. Team is requesting intra abdominal / pelvic abscess drain - aspiration.   Referring Physician(s): Dr. Jeanmarie Hubert.  Supervising Physician: Corrie Mckusick  Patient Status: [facility] - ED  History of Present Illness: Destiny Day is a 72 y.o. female inpatient) History of ESRD ( on HD), HTN, HLD, DM, COPD, CHF, PVD. Presented to the ED on 1.5.24 with right sided abdominal pain. Patient was discharged and returned to the ED on 1.7.24 with the same. CT abd pelvis from 1.7.23 reads Interval enlargement of the previously noted loculated collection along the right pelvic sidewall with irregular mural enhancement, enhancing internal septa, and mild surrounding inflammatory stranding. Additionally, there has developed gas within the superior aspect of the mass, best seen on axial image # 68/3. When compared to remote prior examination of 10/15/2020, this is seen immediately adjacent to the appendix and complex right adnexal cystic lesion. Altogether, the findings are suspicious for a perforated appendicitis with a periappendiceal abscess with probable secondary involvement of the previously noted adnexal mass or a tubo-ovarian abscess. A malignant lesion is considered less likely given its relatively rapid evolution though a secondarily infected malignancy could appear in this fashion. Surgical consultation is recommended.  2.1 cm hypoenhancing lesion within the upper pole of the left kidney suspicious for a primary renal neoplasm. Dedicated renal mass protocol CT or MRI examination is recommended for evaluation once the patient's acute issues have resolved. Patient is thought to have a perforated appendicitis. General Surgery is following. Team is requesting an intra abdominal abscess drain placement - aspiration.  Patient alert and laying in bed,calm. Husband at bedside. Endorses RLQ/ groin pain.  Denies any fevers, headache, chest  pain, SOB, cough, nausea, vomiting or bleeding. Return precautions and treatment recommendations and follow-up discussed with the patient and patient's husband who is agreeable with the plan.   Past Medical History:  Diagnosis Date   Abdominal bruit    Anxiety    Arthritis    Osteoarthritis   Asthma    Cervical disc disease    "pinced nerve"   CHF (congestive heart failure) (HCC)    COPD (chronic obstructive pulmonary disease) (HCC)    Diabetes mellitus    Type II   Diverticulitis    ESRD (end stage renal disease) ([place])    dialysis - M/W/F- [place]   GERD (gastroesophageal reflux disease)    from medications   GI bleed 03/31/2013   Head injury 07/2017   History of hiatal hernia    Hyperlipidemia    Hypertension    Neuropathy    left leg   Nuclear sclerotic cataract of right eye 04/23/2020   Osteoporosis    Peripheral vascular disease ([place])    Pneumonia    "very young" and a few years ago   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 ([place])    Vitrectomy membrane peel endolaser 05-07-2021   Seasonal allergies    Shortness of breath dyspnea    WIth exertion, when fluid builds   Sleep apnea    can't afford cpap    Vitreomacular traction syndrome, 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   05-07-2021  Vitrectomy, membrane peel, PRP, resection posterior segment of NVD, remnants of peripheral NVE remain in place due to atrophic retina, no vitreous substitute    Past Surgical History:  Procedure Laterality Date   A/V SHUNTOGRAM N/A 09/22/2016   Procedure: A/V Shuntogram - left arm;  Surgeon: Durene Fruits  Pierre Bali, MD;  Location: [place] [facility];  Service: Cardiovascular;  Laterality: N/A;   A/V SHUNTOGRAM N/A 03/22/2018   Procedure: A/V SHUNTOGRAM - left arm;  Surgeon: Serafina Mitchell, MD;  Location: [place] [facility];  Service: Cardiovascular;  Laterality: N/A;   A/V SHUNTOGRAM Left 11/17/2018   Procedure: A/V SHUNTOGRAM;  Surgeon: Angelia Mould, MD;  Location: [place] [facility];  Service: Cardiovascular;  Laterality: Left;   ABDOMINAL HYSTERECTOMY  1993`   AMPUTATION Left 09/01/2016   Procedure: LEFT FOOT TRANSMETATARSAL AMPUTATION;  Surgeon: Newt Minion, MD;  Location: [place];  Service: Orthopedics;  Laterality: Left;   AMPUTATION Right 08/11/2017   Procedure: RIGHT GREAT TOE AMPUTATION DIGIT;  Surgeon: Rosetta Posner, MD;  Location: [place];  Service: Vascular;  Laterality: Right;   AMPUTATION Right 12/31/2017   Procedure: RIGHT TRANSMETATARSAL AMPUTATION;  Surgeon: Newt Minion, MD;  Location: [place];  Service: Orthopedics;  Laterality: Right;   AV FISTULA PLACEMENT Left 04/21/2016   Procedure: INSERTION OF ARTERIOVENOUS (AV) GORE-TEX GRAFT ARM LEFT;  Surgeon: Elam Dutch, MD;  Location: [place];  Service: Vascular;  Laterality: Left;   BASCILIC VEIN TRANSPOSITION Left 07/10/2014   Procedure: BASCILIC VEIN TRANSPOSITION;  Surgeon: Angelia Mould, MD;  Location: [place];  Service: Vascular;  Laterality: Left;   [place] Right 11/08/2014   Procedure: FIRST STAGE BASILIC VEIN TRANSPOSITION;  Surgeon: Angelia Mould, MD;  Location: [place];  Service: Vascular;  Laterality: Right;   [place] Right 01/18/2015   Procedure: SECOND STAGE BASILIC VEIN TRANSPOSITION;  Surgeon: Angelia Mould, MD;  Location: [place];  Service: Vascular;  Laterality: Right;   CRANIOTOMY N/A 08/23/2017   Procedure: CRANIOTOMY HEMATOMA EVACUATION SUBDURAL;  Surgeon: Ashok Pall, MD;  Location: [place];  Service: Neurosurgery;  Laterality: N/A;   CRANIOTOMY Left 08/24/2017   Procedure: CRANIOTOMY FOR RECURRENT ACUTE SUBDURAL HEMATOMA;  Surgeon: Ashok Pall, MD;  Location: [place];  Service: Neurosurgery;  Laterality: Left;   ESOPHAGOGASTRODUODENOSCOPY N/A 03/31/2013   Procedure: ESOPHAGOGASTRODUODENOSCOPY (EGD);  Surgeon: Gatha Mayer, MD;  Location: [facility];  Service: Endoscopy;  Laterality: N/A;    EYE SURGERY     laser surgery   FEMORAL-POPLITEAL BYPASS GRAFT Left 07/08/2016   Procedure: LEFT  FEMORAL-BELOW KNEE POPLITEAL ARTERY BYPASS GRAFT USING 6MM X 80 CM PROPATEN GORETEX GRAFT WITH RINGS.;  Surgeon: Rosetta Posner, MD;  Location: [facility] OR;  Service: Vascular;  Laterality: Left;   FEMORAL-POPLITEAL BYPASS GRAFT Right 08/11/2017   Procedure: RIGHT FEMORAL TO BELOW KNEE POPLITEAL ARTERKY  BYPASS GRAFT USING 6MM RINGED PROPATEN GRAFT;  Surgeon: Rosetta Posner, MD;  Location: [place];  Service: Vascular;  Laterality: Right;   FISTULOGRAM Left 10/29/2014   Procedure: FISTULOGRAM;  Surgeon: Angelia Mould, MD;  Location: [facility] CATH LAB;  Service: Cardiovascular;  Laterality: Left;   GAS/FLUID EXCHANGE Left 12/20/2018   Procedure: AIR GAS EXCHANGE;  Surgeon: Jalene Mullet, MD;  Location: [place];  Service: Ophthalmology;  Laterality: Left;   INSERTION OF DIALYSIS CATHETER N/A 09/05/2020   Procedure: INSERTION OF DIALYSIS CATHETER;  Surgeon: Cherre Robins, MD;  Location: [place];  Service: Vascular;  Laterality: N/A;   INSERTION OF DIALYSIS CATHETER Left 03/13/2021   Procedure: INSERTION OF DIALYSIS CATHETER;  Surgeon: Waynetta Sandy, MD;  Location: [place];  Service: Vascular;  Laterality: Left;   KNEE ARTHROSCOPY Right 05/06/2018   Procedure: RIGHT KNEE ARTHROSCOPY;  Surgeon: Sharol Given,  Illene Regulus, MD;  Location: [place];  Service: Orthopedics;  Laterality: Right;   KNEE ARTHROSCOPY Right 06/10/2018   Procedure: RIGHT KNEE ARTHROSCOPY AND DEBRIDEMENT;  Surgeon: Newt Minion, MD;  Location: [place];  Service: Orthopedics;  Laterality: Right;   LIGATION OF ARTERIOVENOUS  FISTULA Left 04/21/2016   Procedure: LIGATION OF ARTERIOVENOUS  FISTULA LEFT ARM;  Surgeon: Elam Dutch, MD;  Location: [place];  Service: Vascular;  Laterality: Left;   LOWER EXTREMITY ANGIOGRAPHY N/A 04/06/2017   Procedure: Lower Extremity Angiography - Right;  Surgeon: Serafina Mitchell, MD;  Location: [place] [facility];  Service:  Cardiovascular;  Laterality: N/A;   MEMBRANE PEEL Left 12/20/2018   Procedure: MEMBRANE PEEL;  Surgeon: Jalene Mullet, MD;  Location: [place];  Service: Ophthalmology;  Laterality: Left;   MEMBRANE PEEL Right 05/29/2022   Procedure: MEMBRANE PEEL;  Surgeon: Hurman Horn, MD;  Location: [place];  Service: Ophthalmology;  Laterality: Right;   PARS PLANA VITRECTOMY Right 05/29/2022   Procedure: PARS PLANA VITRECTOMY WITH 25 GAUGE;  Surgeon: Hurman Horn, MD;  Location: [place];  Service: Ophthalmology;  Laterality: Right;   PATCH ANGIOPLASTY Right 01/18/2015   Procedure: BASILIC VEIN PATCH ANGIOPLASTY USING VASCUGUARD PATCH;  Surgeon: Angelia Mould, MD;  Location: [place];  Service: Vascular;  Laterality: Right;   PERIPHERAL VASCULAR BALLOON ANGIOPLASTY  09/22/2016   Procedure: Peripheral Vascular Balloon Angioplasty;  Surgeon: Serafina Mitchell, MD;  Location: [place] [facility];  Service: Cardiovascular;;  Lt. Fistula   PERIPHERAL VASCULAR BALLOON ANGIOPLASTY Left 03/22/2018   Procedure: PERIPHERAL VASCULAR BALLOON ANGIOPLASTY;  Surgeon: Serafina Mitchell, MD;  Location: [place] [facility];  Service: Cardiovascular;  Laterality: Left;  Arm shunt   PERIPHERAL VASCULAR CATHETERIZATION N/A 06/23/2016   Procedure: Abdominal Aortogram w/Lower Extremity;  Surgeon: Serafina Mitchell, MD;  Location: [place] [facility];  Service: Cardiovascular;  Laterality: N/A;   PERIPHERAL VASCULAR CATHETERIZATION  06/23/2016   Procedure: Peripheral Vascular Intervention;  Surgeon: Serafina Mitchell, MD;  Location: [place] [facility];  Service: Cardiovascular;;  lt common and external illiac artery   PHOTOCOAGULATION WITH LASER Left 12/20/2018   Procedure: PHOTOCOAGULATION WITH LASER;  Surgeon: Jalene Mullet, MD;  Location: [place];  Service: Ophthalmology;  Laterality: Left;   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   Procedure: REPAIR OF COMPLEX TRACTION RETINAL DETACHMENT;  Surgeon: Jalene Mullet, MD;   Location: [place];  Service: Ophthalmology;  Laterality: Left;   REVISION OF ARTERIOVENOUS GORETEX GRAFT Left 09/05/2020   Procedure: REVISION OF ARTERIOVENOUS GORETEX GRAFT LEFT;  Surgeon: Cherre Robins, MD;  Location: [place];  Service: Vascular;  Laterality: Left;    Allergies: Adhesive  [tape], Lactose intolerance (gi), Lisinopril, and Prednisone  Medications: Prior to Admission medications   Medication Sig Start Date End Date Taking? Authorizing Provider  albuterol (VENTOLIN HFA) 108 (90 Base) MCG/ACT inhaler Inhale 2 puffs into the lungs every 4 (four) hours as needed for wheezing or shortness of breath. 07/21/22  Yes Piontek, Erin, MD  amLODipine (NORVASC) 10 MG tablet Take 10 mg by mouth daily. 02/09/22  Yes [provider]  aspirin EC 81 MG tablet Take 81 mg by mouth daily.   Yes [provider]  atorvastatin (LIPITOR) 40 MG tablet Take 40 mg by mouth at bedtime. 07/17/21  Yes [provider]  b complex-vitamin c-folic acid (NEPHRO-VITE) 0.8 MG TABS tablet Take 1 tablet by mouth at bedtime.   Yes  [provider]  busPIRone (BUSPAR) 7.5 MG tablet Take 7.5 mg by mouth daily as needed (anxiety). 10/29/19  Yes [provider]  carvedilol (COREG) 12.5 MG tablet Take 12.5 mg by mouth See admin instructions. Take 1 tablet (12.5 mg) by mouth once daily in the evening on Mondays, Wednesdays, & Fridays. (Dialysis) Take 1 tablet (12.5 mg) by mouth twice daily on Sundays, Tuesdays, Thursdays, & Saturdays. (Non Dialysis)   Yes [provider]  cinacalcet (SENSIPAR) 60 MG tablet Take 60 mg by mouth See admin instructions. Take 1 tablet (60 mg) by mouth in the evening on Mondays, Wednesdays, & Fridays (dialysis) Take 1 tablet (60 mg) by mouth twice daily on Sundays, Tuesdays, Thursdays, Saturdays. (non dialysis) 11/28/18  Yes [provider]  diphenhydrAMINE HCl (BENADRYL ALLERGY PO) Take 1 capsule by mouth every Monday, Wednesday, and Friday  with hemodialysis. If needed to help with allergies   Yes [provider]  Doxercalciferol (HECTOROL IV) Doxercalciferol (Hectorol) 08/06/21 08/05/22 Yes [provider]  ferric citrate (AURYXIA) 1 GM 210 MG(Fe) tablet Take 210-420 mg by mouth See admin instructions. Take 2 tablets (420 mg) by mouth with each meal & take 1 tablet (210 mg) by mouth with snacks.   Yes [provider]  fluticasone (FLONASE) 50 MCG/ACT nasal spray Place 2 sprays into both nostrils daily as needed for allergies or rhinitis. 04/07/22  Yes Valentina Shaggy, MD  gabapentin (NEURONTIN) 100 MG capsule TAKE 1 CAPSULE (100 MG TOTAL) BY MOUTH AT BEDTIME. WHEN NECESSARY FOR NEUROPATHY PAIN Patient taking differently: Take 100 mg by mouth at bedtime as needed (When necessary for neuropathy pain). 12/13/18  Yes Newt Minion, MD  glipiZIDE (GLUCOTROL) 5 MG tablet Take 5 mg by mouth at bedtime.   Yes [provider]  isosorbide-hydrALAZINE (BIDIL) 20-37.5 MG tablet Take 1 tablet by mouth See admin instructions. hs on Monday,Wednesday,Friday (dialysis days) Tid on Tuesday,Thursday,Saturday,Sunday(non-dialysis days) Patient taking differently: Take 1 tablet by mouth See admin instructions. Take in the evening on Monday,Wednesday,Friday (dialysis days) BID on Tuesday,Thursday,Saturday,Sunday(non-dialysis days) 01/08/22  Yes Cantwell, Celeste C, PA-C  lidocaine-prilocaine (EMLA) cream Apply 1 Application topically as needed. 03/31/22  Yes Vanessa Kick, MD  loperamide (IMODIUM) 2 MG capsule Take 2 capsules (4 mg total) by mouth 3 (three) times daily as needed for diarrhea or loose stools. 08/04/21  Yes Bonnielee Haff, MD  Methoxy PEG-Epoetin Beta (MIRCERA IJ) Mircera 08/20/21 10/23/22 Yes [provider]  montelukast (SINGULAIR) 10 MG tablet TAKE 1 TABLET BY MOUTH EVERYDAY AT BEDTIME Patient taking differently: Take 10 mg by mouth at bedtime. 04/07/22  Yes Valentina Shaggy, MD  omeprazole  (PRILOSEC) 20 MG capsule Take 20 mg by mouth daily as needed (for acid reflux).   Yes [provider]  ondansetron (ZOFRAN) 4 MG tablet Take 1 tablet (4 mg total) by mouth every 6 (six) hours as needed for nausea. 05/22/19  Yes Aline August, MD  promethazine-dextromethorphan (PROMETHAZINE-DM) 6.25-15 MG/5ML syrup Take 5 mLs by mouth 4 (four) times daily as needed for cough. 07/21/22  Yes Piontek, Junie Panning, MD  traZODone (DESYREL) 50 MG tablet Take 25-50 mg by mouth at bedtime as needed for sleep. 10/29/19  Yes [provider]  ursodiol (ACTIGALL) 250 MG tablet Take 250 mg by mouth 2 (two) times daily. 07/28/22  Yes [provider]  budesonide-formoterol (SYMBICORT) 160-4.5 MCG/ACT inhaler Inhale 2 puffs into the lungs 2 (two) times daily. Patient not taking: Reported on 05/28/2022 04/07/22   Salvatore Marvel  Louis, MD  EPINEPHrine (AUVI-Q) 0.3 mg/0.3 mL IJ SOAJ injection Inject 0.3 mg into the muscle as needed for anaphylaxis. Patient not taking: Reported on 08/03/2022 12/24/20   Valentina Shaggy, MD  fluticasone-salmeterol (ADVAIR [facility]) [id number] MCG/ACT inhaler Inhale two puffs twice daily Patient not taking: Reported on 05/28/2022 12/24/20   Valentina Shaggy, MD  latanoprost (XALATAN) 0.005 % ophthalmic solution Place 1 drop into the left eye at bedtime. Patient not taking: Reported on 05/28/2022 11/25/21 11/25/22  Rankin, Clent Demark, MD  levocetirizine (XYZAL) 5 MG tablet Take 1 tablet (5 mg total) by mouth every evening. Patient not taking: Reported on 08/03/2022 04/07/22   Valentina Shaggy, MD  ofloxacin (OCUFLOX) 0.3 % ophthalmic solution Place 1 drop into the right eye 4 (four) times daily. Patient not taking: Reported on 08/03/2022 05/27/22   [provider]  prednisoLONE acetate (PRED FORTE) 1 % ophthalmic suspension Place 1 drop into the right eye 4 (four) times daily. Patient not taking: Reported on 08/03/2022 05/27/22   [provider]  Spacer/Aero-Holding  Chambers (AEROCHAMBER PLUS) inhaler Use with inhaler 03/09/21   Melynda Ripple, MD     Family History  Problem Relation Age of Onset   Other Mother        not sure of cause of death   Diabetes Father    Diabetes Sister    Diabetes Sister    Pancreatic cancer Maternal Grandmother    Colon cancer Neg Hx     Social History   Socioeconomic History   Marital status: Married    Spouse name: [name]   Number of children: 4   Years of education: some collg   Highest education level: Not on file  Occupational History   Occupation: Retired    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  Tobacco Use   Smoking status: Former    Packs/day: 0.35    Years: 40.00    Total pack years: 14.00    Types: Cigarettes    Quit date: 05/10/2012    Years since quitting: 10.2   Smokeless tobacco: Never  Vaping Use   Vaping Use: Never used  Substance and Sexual Activity   Alcohol use: No   Drug use: No    Comment: marijuana; quit in early 1980's   Sexual activity: Yes  Other Topics Concern   Not on file  Social History Narrative   Patient lives at home with spouse.   Caffeine Use: 1 cup daily   Social Determinants of Health   Financial Resource Strain: Not on file  Food Insecurity: No Food Insecurity (05/30/2020)   Hunger Vital Sign    Worried About Running Out of Food in the Last Year: Never true    Ran Out of Food in the Last Year: Never true  Transportation Needs: No Transportation Needs (05/30/2020)   PRAPARE - Hydrologist (Medical): No    Lack of Transportation (Non-Medical): No  Physical Activity: Not on file  Stress: Not on file  Social Connections: Not on file     Review of Systems: A 12 point ROS discussed and pertinent positives are indicated in the HPI above.  All other systems are negative.  Review of Systems  Constitutional:  Negative for fatigue and fever.  HENT:  Negative for congestion.   Respiratory:  Negative for cough and  shortness of breath.   Gastrointestinal:  Positive for abdominal pain. Negative for diarrhea, nausea and vomiting. Constipation: right lower quadrant/ groin pain.  Vital Signs: BP 124/89   Pulse 98   Temp 99 F (37.2 C) (Oral)   Resp 17   Ht '5\' 5"'$  (1.651 m)   Wt 139 lb 15.9 oz (63.5 kg)   SpO2 95%   BMI 23.30 kg/m     Physical Exam Vitals and nursing note reviewed.  Constitutional:      Appearance: She is well-developed.  HENT:     Head: Normocephalic and atraumatic.  Eyes:     Conjunctiva/sclera: Conjunctivae normal.  Cardiovascular:     Rate and Rhythm: Normal rate and regular rhythm.     Comments: Left graft Pulmonary:     Effort: Pulmonary effort is normal.     Breath sounds: Normal breath sounds.  Musculoskeletal:        General: Normal range of motion.     Cervical back: Normal range of motion.  Skin:    General: Skin is warm.  Neurological:     Mental Status: She is alert and oriented to person, place, and time.     Imaging: CT ABDOMEN PELVIS W CONTRAST  Result Date: 08/04/2022 CLINICAL DATA:  Right groin and right abdominal pain. EXAM: CT ABDOMEN AND PELVIS WITH CONTRAST TECHNIQUE: Multidetector CT imaging of the abdomen and pelvis was performed using the standard protocol following bolus administration of intravenous contrast. RADIATION DOSE REDUCTION: This exam was performed according to the departmental dose-optimization program which includes automated exposure control, adjustment of the mA and/or kV according to patient size and/or use of iterative reconstruction technique. CONTRAST:  60m OMNIPAQUE IOHEXOL 350 MG/ML SOLN COMPARISON:  07/31/2022 FINDINGS: Lower chest: No acute abnormality. Hepatobiliary: Cholelithiasis without pericholecystic inflammatory change noted. The liver is unremarkable. No intra or extrahepatic biliary ductal dilation. Pancreas: Unremarkable Spleen: Unremarkable Adrenals/Urinary Tract: The adrenal glands are unremarkable. The kidneys  are normal in position. There is mild to moderate renal cortical atrophy bilaterally. Multiple simple cortical cysts are seen within the kidneys bilaterally. Within the upper pole of the left kidney, however, a hypoenhancing 2.1 cm lesion is identified suspicious for AA primary renal neoplasm. Vascular calcifications noted within the renal hila bilaterally. No definite urinary renal or ureteral calculi. No hydronephrosis. The bladder is unremarkable. Stomach/Bowel: The stomach, small bowel, and large bowel are unremarkable. The previously noted loculated collection along the right pelvic sidewall is again identified and is enlarged, measuring 3.9 x 5.3 cm at axial image # 69/3. As noted on prior examination, there is irregular mural enhancement, enhancing internal septa, and mild surrounding inflammatory stranding. Additionally, on the current examination, there has developed gas within the superior aspect of the mass, best seen on axial image # 68/3. When compared to remote prior examination of 10/15/2020, this is seen immediately adjacent to the appendix and complex right adnexal cystic lesion. Altogether, the findings are suspicious for a perforated appendicitis with a periappendiceal abscess with secondary involvement of the previously noted adnexal mass or a tubo-ovarian abscess. A malignant lesion is considered less likely given its relatively rapid evolution though a secondarily infected malignancy could appear in this fashion. No free intraperitoneal gas or fluid. No evidence of obstruction. Vascular/Lymphatic: Extensive aortoiliac atherosclerotic calcification. Extensive visceral arteriosclerosis. 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 No pathologic adenopathy within the abdomen  and pelvis. Reproductive: Uterus absent.  No adnexal masses on the left. Other: Small right  femoral hernia is again identified though bowel within the hernia sac noted on prior examination has spontaneously reduced in the interval. Musculoskeletal: Degenerative changes are seen within the lumbar spine. No acute bone abnormality. No lytic or blastic bone lesion. IMPRESSION: 1. Interval enlargement of the previously noted loculated collection along the right pelvic sidewall with irregular mural enhancement, enhancing internal septa, and mild surrounding inflammatory stranding. Additionally, there has developed gas within the superior aspect of the mass, best seen on axial image # 68/3. When compared to remote prior examination of 10/15/2020, this is seen immediately adjacent to the appendix and complex right adnexal cystic lesion. Altogether, the findings are suspicious for a perforated appendicitis with a periappendiceal abscess with probable secondary involvement of the previously noted adnexal mass or a tubo-ovarian abscess. A malignant lesion is considered less likely given its relatively rapid evolution though a secondarily infected malignancy could appear in this fashion. Surgical consultation is recommended. 2. 2.1 cm hypoenhancing lesion within the upper pole of the left kidney suspicious for a primary renal neoplasm. Dedicated renal mass protocol CT or MRI examination is recommended for evaluation once the patient's acute issues have resolved. 3. Cholelithiasis. 4. Small right fat containing femoral hernia. Previously noted bowel within the hernia sac has spontaneously reduced. Aortic Atherosclerosis (ICD10-I70.0). Electronically Signed   By: Fidela Salisbury M.D.   On: 07/30/2022 20:35   US Abdomen Limited RUQ (LIVER/GB)  Result Date: 08/13/2022 CLINICAL DATA:  Right-sided abdominal pain EXAM: ULTRASOUND ABDOMEN LIMITED RIGHT UPPER QUADRANT COMPARISON:  CT abdomen and pelvis 07/31/2022 FINDINGS:  Gallbladder: Small gallstones are present. There is no gallbladder wall thickening or pericholecystic fluid. Sonographic Percell Miller sign is positive per sonographer. Common bile duct: Diameter: 5.1 mm Liver: No focal lesion identified. Increase in parenchymal echogenicity. Portal vein is patent on color Doppler imaging with normal direction of blood flow towards the liver. Other: 1.4 x 2.2 x 1.4 cm right renal cyst. Right kidney appears echogenic. IMPRESSION: 1. Cholelithiasis with positive sonographic Murphy sign. No gallbladder wall thickening or pericholecystic fluid. Findings are equivocal for acute cholecystitis. 2. Increased echogenicity of the liver. This is a nonspecific finding but may be seen in the setting of hepatic steatosis. 3. Echogenic right kidney, suggesting medical renal disease. Electronically Signed   By: Ronney Asters M.D.   On: 07/31/2022 18:35   CT ABDOMEN PELVIS W CONTRAST  Result Date: 07/31/2022 CLINICAL DATA:  Right lower quadrant pain for several hours EXAM: CT ABDOMEN AND PELVIS WITH CONTRAST TECHNIQUE: Multidetector CT imaging of the abdomen and pelvis was performed using the standard protocol following bolus administration of intravenous contrast. RADIATION DOSE REDUCTION: This exam was performed according to the departmental dose-optimization program which includes automated exposure control, adjustment of the mA and/or kV according to patient size and/or use of iterative reconstruction technique. CONTRAST:  45m OMNIPAQUE IOHEXOL 350 MG/ML SOLN COMPARISON:  10/15/2020, ultrasound from 11/14/2020 FINDINGS: Lower chest: No acute abnormality. Hepatobiliary: Liver is within normal limits. Gallbladder is well distended. Dependent density is noted consistent with small gallstones. No biliary ductal dilatation is noted. Pancreas: Unremarkable. No pancreatic ductal dilatation or surrounding inflammatory changes. Spleen: Normal in size without focal abnormality. Adrenals/Urinary Tract:  Adrenal glands are within normal limits. Kidneys are well visualized bilaterally and demonstrate heavy renal vascular calcifications. Bilateral parapelvic cysts are noted stable in appearance from the prior exam. No follow-up is recommended. No definitive renal calculi are noted. The ureters are within normal limits. The bladder is decompressed. Stomach/Bowel: No obstructive or inflammatory  changes of the colon are noted. The appendix is within normal limits. No inflammatory changes are seen. Small bowel and stomach are within normal limits. Vascular/Lymphatic: Aortic atherosclerosis. No enlarged abdominal or pelvic lymph nodes. Reproductive: Uterus has been surgically removed. There is a persistent right adnexal mass identified measuring 3.9 by 2.7 cm. This is decreased in size from the prior exam. This was characterized on prior ultrasound examination from 2022. Other: No abdominal wall hernia or abnormality. No abdominopelvic ascites. Musculoskeletal: No acute or significant osseous findings. IMPRESSION: Dependent gallstones without complicating factors. Persistent but decreased in size right adnexal mass with calcification. This has been previously characterized on ultrasound examination and surgical consultation recommended. No other focal abnormality is noted. Electronically Signed   By: Inez Catalina M.D.   On: 07/31/2022 01:01    Labs:  CBC: Recent Labs    08/04/21 0346 05/29/22 1438 07/30/22 1821 08/26/2022 1730 08/03/22 0008 08/03/22 0422  WBC 6.4  --  13.7* 19.6*  --  21.7*  HGB 10.4*   < > 13.6 11.6* 10.9* 10.9*  HCT 31.7*   < > 41.9 36.5 34.2* 32.7*  PLT 226  --  237 243  --  264   < > = values in this interval not displayed.    COAGS: Recent Labs    08/03/22 0422  INR 1.0    BMP: Recent Labs    08/04/21 0346 05/29/22 1438 07/30/22 1821 08/14/2022 1730 08/03/22 0422  NA 133* 132* 134* 129* 130*  K 3.3* 3.9 3.3* 3.3* 3.2*  CL 94* 96* 91* 88* 92*  CO2 24  --  '27 25 26   '$ GLUCOSE 129* 89 128* 250* 165*  BUN 27* 34* '13 21 23  '$ CALCIUM 8.4*  --  10.2 9.7 9.2  CREATININE 6.70* 6.50* 5.19* 6.42* 6.91*  GFRNONAA 6*  --  8* 6* 6*    LIVER FUNCTION TESTS: Recent Labs    08/04/21 0346 07/30/22 1821 07/31/2022 1730 08/03/22 0422  BILITOT  --  0.6 0.6 0.6  AST  --  '15 19 15  '$ ALT  --  '11 9 9  '$ ALKPHOS  --  84 93 86  PROT  --  8.0 6.3* 5.9*  ALBUMIN 3.1* 3.4* 2.7* 2.3*   Assessment and Plan:  72 y.o. female (inpatient) History of ESRD ( on HD), HTN, HLD, DM, COPD, CHF, PVD. Presented to the ED on 1.5.24 with right sided abdominal pain. Patient was discharged and returned to the ED on 1.7.24 with the same. CT abd pelvis from 1.7.23 reads Interval enlargement of the previously noted loculated collection along the right pelvic sidewall with irregular mural enhancement, enhancing internal septa, and mild surrounding inflammatory stranding. Additionally, there has developed gas within the superior aspect of the mass, best seen on axial image # 68/3. When compared to remote prior examination of 10/15/2020, this is seen immediately adjacent to the appendix and complex right adnexal cystic lesion. Altogether, the findings are suspicious for a perforated appendicitis with a periappendiceal abscess with probable secondary involvement of the previously noted adnexal mass or a tubo-ovarian abscess. A malignant lesion is considered less likely given its relatively rapid evolution though a secondarily infected malignancy could appear in this fashion. Surgical consultation is recommended.  2.1 cm hypoenhancing lesion within the upper pole of the left kidney suspicious for a primary renal neoplasm. Dedicated renal mass protocol CT or MRI examination is recommended for evaluation once the patient's acute issues have resolved. Patient is thought to have a  perforated appendicitis. General Surgery is following. Team is requesting an intra abdominal abscess drain placement -  aspiration.  Patient is on ASA. CRP 20.4. BNP 1676, Cr 6.91, K 3.2, WBC 21.7. Last dialysis session on 1.5.24. Pertinent allergies include Tape, Prednisone. Patient has been NPO since midnight.  Risks and benefits discussed with the patient including bleeding, infection, damage to adjacent structures, bowel perforation/fistula connection, and sepsis.  All of the patient's and the patient's husband's questions were answered, patient is agreeable to proceed.  Consent signed and in IR control room.    Thank you for this interesting consult.  I greatly enjoyed meeting CHERISSA HOOK and look forward to participating in their care.  A copy of this report was sent to the requesting provider on this date.  Electronically Signed: Jacqualine Mau, NP 08/03/2022, 9:01 AM   I spent a total of 40 Minutes    in face to face in clinical consultation, greater than 50% of which was counseling/coordinating care for intra abdominal abscess drain placement - aspiration.

## 2022-08-03 NOTE — Progress Notes (Signed)
PROGRESS NOTE  Destiny Day VFI:[id number] DOB: [birth year] DOA: 08/17/2022 PCP: Cipriano Mile, NP   LOS: 1 day   Brief Narrative / Interim history: 72 year old female with history of ESRD on MWF HD, asthma/COPD, diastolic CHF, HTN, HLD, DM2, comes into the hospital with few days of right lower quadrant abdominal pain.  Imaging in the ED concerning for perforated appendicitis with a periappendiceal abscess with probably secondary involvement of the previously noted adnexal mass or a tubo-ovarian abscess.  She was placed on antibiotics, general surgery was consulted and she was admitted to the hospital.  Subjective / 24h Interval events: Doing well, complains of right lower quadrant abdominal pain.  Denies any fever or chills.  She reports that she has been feeling sick to her stomach for quite some time  Assesement and Plan: Principal problem Intra-abdominal abscess, concern for perforated appendicitis -patient started on antibiotics with Zosyn, continue.  General surgery consulted, discussed with Dr. Donne Hazel this morning, will consult IR for abscess drainage.  Please send fluid analysis including cultures, Gram stain, cell count to better tailor antibiotic regimen -Continue Zosyn for now  Active problems Essential hypertension-resume home medications.  ESRD on HD-nephrology consulted, contacted Dr. Jonnie Finner  Hyperlipidemia-resume home statin  Chronic diastolic CHF -appears euvolemic, volume management per HD  Anxiety-continue home BuSpar as needed  Asthma/COPD-stable, no wheezing, no exacerbation  Hypokalemia-replete and continue to monitor  DM2-continue sliding scale.  CBG (last 3)  Recent Labs    08/03/22 0024 08/03/22 0820  GLUCAP 149* 151*    Scheduled Meds:  carvedilol  12.5 mg Oral See admin instructions   fluticasone furoate-vilanterol  1 puff Inhalation Daily   gabapentin  100 mg Oral QHS   insulin aspart  0-6 Units Subcutaneous TID WC    isosorbide-hydrALAZINE  1 tablet Oral See admin instructions   pantoprazole (PROTONIX) IV  40 mg Intravenous Q12H   ursodiol  250 mg Oral BID   Continuous Infusions:  acetaminophen     piperacillin-tazobactam Stopped (08/03/22 0606)   PRN Meds:.acetaminophen, albuterol, busPIRone, fentaNYL (SUBLIMAZE) injection, fluticasone, hydrALAZINE, HYDROmorphone (DILAUDID) injection, ondansetron, oxyCODONE, oxyCODONE, traZODone  Current Outpatient Medications  Medication Instructions   albuterol (VENTOLIN HFA) 108 (90 Base) MCG/ACT inhaler 2 puffs, Inhalation, Every 4 hours PRN   amLODipine (NORVASC) 10 mg, Oral, Daily   aspirin EC 81 mg, Oral, Daily   atorvastatin (LIPITOR) 40 mg, Oral, Daily at bedtime   b complex-vitamin c-folic acid (NEPHRO-VITE) 0.8 MG TABS tablet 1 tablet, Oral, Daily at bedtime   budesonide-formoterol (SYMBICORT) 160-4.5 MCG/ACT inhaler 2 puffs, Inhalation, 2 times daily   busPIRone (BUSPAR) 7.5 mg, Oral, Daily PRN   carvedilol (COREG) 12.5 mg, Oral, See admin instructions, Take 1 tablet (12.5 mg) by mouth once daily in the evening on Mondays, Wednesdays, & Fridays. (Dialysis)<BR>Take 1 tablet (12.5 mg) by mouth twice daily on Sundays, Tuesdays, Thursdays, & Saturdays. (Non Dialysis)   cinacalcet (SENSIPAR) 60 mg, Oral, See admin instructions, Take 1 tablet (60 mg) by mouth in the evening on Mondays, Wednesdays, & Fridays (dialysis)<BR>Take 1 tablet (60 mg) by mouth twice daily on Sundays, Tuesdays, Thursdays, Saturdays. (non dialysis)   diphenhydrAMINE HCl (BENADRYL ALLERGY PO) 1 capsule, Oral, Every M-W-F (Hemodialysis), If needed to help with allergies   Doxercalciferol (HECTOROL IV) Doxercalciferol (Hectorol)   EPINEPHrine (AUVI-Q) 0.3 mg, Intramuscular, As needed   ferric citrate (AURYXIA) 210-420 mg, Oral, See admin instructions, Take 2 tablets (420 mg) by mouth with each meal & take 1 tablet (210 mg) by mouth  with snacks.    fluticasone (FLONASE) 50 MCG/ACT nasal spray 2  sprays, Each Nare, Daily PRN   fluticasone-salmeterol (ADVAIR HFA) 115-21 MCG/ACT inhaler Inhale two puffs twice daily   gabapentin (NEURONTIN) 100 mg, Oral, Daily at bedtime, When necessary for neuropathy pain   glipiZIDE (GLUCOTROL) 5 mg, Oral, Daily at bedtime   isosorbide-hydrALAZINE (BIDIL) 20-37.5 MG tablet 1 tablet, Oral, See admin instructions, hs on Monday,Wednesday,Friday (dialysis days)<BR>Tid on Tuesday,Thursday,Saturday,'Sunday(non-dialysis days)   latanoprost (XALATAN) 0.005 % ophthalmic solution 1 drop, Left Eye, Daily at bedtime   levocetirizine (XYZAL) 5 mg, Oral, Every evening   lidocaine-prilocaine (EMLA) cream 1 Application, Topical, As needed   loperamide (IMODIUM) 4 mg, Oral, 3 times daily PRN   Methoxy PEG-Epoetin Beta (MIRCERA IJ) Mircera   montelukast (SINGULAIR) 10 MG tablet TAKE 1 TABLET BY MOUTH EVERYDAY AT BEDTIME   ofloxacin (OCUFLOX) 0.3 % ophthalmic solution 1 drop, 4 times daily   omeprazole (PRILOSEC) 20 mg, Oral, Daily PRN   ondansetron (ZOFRAN) 4 mg, Oral, Every 6 hours PRN   prednisoLONE acetate (PRED FORTE) 1 % ophthalmic suspension 1 drop, 4 times daily   promethazine-dextromethorphan (PROMETHAZINE-DM) 6.25-15 MG/5ML syrup 5 mLs, Oral, 4 times daily PRN   Spacer/Aero-Holding Chambers (AEROCHAMBER PLUS) inhaler Use with inhaler   traZODone (DESYREL) 25-50 mg, Oral, At bedtime PRN   ursodiol (ACTIGALL) 250 mg, Oral, 2 times daily    Diet Orders (From admission, onward)     Start     Ordered   08/26/2022 2122  Diet NPO time specified  Diet effective now        01'$ /07/24 2121            DVT prophylaxis: SCDs Start: 08/08/2022 2213   Lab Results  Component Value Date   PLT 264 08/03/2022      Code Status: Full Code  Family Communication: no family at bedside   Status is: Inpatient  Remains inpatient appropriate because: severity of illness  Level of care: Progressive  Consultants:  General surgery IR Nephrology   Objective: Vitals:    08/03/22 0740 08/03/22 0830 08/03/22 0845 08/03/22 0910  BP:  124/89 127/67   Pulse: 95 98 97   Resp: 20 17 (!) 27   Temp:    98.5 F (36.9 C)  TempSrc:    Oral  SpO2: 99% 95% 96%   Weight:      Height:        Intake/Output Summary (Last 24 hours) at 08/03/2022 1002 Last data filed at 08/03/2022 0851 Gross per 24 hour  Intake 300 ml  Output --  Net 300 ml   Wt Readings from Last 3 Encounters:  08/03/22 63.5 kg  05/29/22 63.5 kg  02/19/22 61.7 kg    Examination:  Constitutional: NAD Eyes: no scleral icterus ENMT: Mucous membranes are moist.  Neck: normal, supple Respiratory: clear to auscultation bilaterally, no wheezing, no crackles. Normal respiratory effort. No accessory muscle use.  Cardiovascular: Regular rate and rhythm, no murmurs / rubs / gallops. No LE edema.  Abdomen: non distended, no tenderness. Bowel sounds positive.  Musculoskeletal: no clubbing / cyanosis.   Data Reviewed: I have independently reviewed following labs and imaging studies   CBC Recent Labs  Lab 07/30/22 1821 08/13/2022 1730 08/03/22 0008 08/03/22 0422  WBC 13.7* 19.6*  --  21.7*  HGB 13.6 11.6* 10.9* 10.9*  HCT 41.9 36.5 34.2* 32.7*  PLT 237 243  --  264  MCV 97.9 100.0  --  97.0  MCH 31.8  31.8  --  32.3  MCHC 32.5 31.8  --  33.3  RDW 13.0 12.9  --  13.0  LYMPHSABS 1.3 0.9  --   --   MONOABS 0.6 0.7  --   --   EOSABS 0.1 0.1  --   --   BASOSABS 0.0 0.0  --   --     Recent Labs  Lab 07/30/22 1821 08/25/2022 1730 08/03/22 0008 08/03/22 0422  NA 134* 129*  --  130*  K 3.3* 3.3*  --  3.2*  CL 91* 88*  --  92*  CO2 27 25  --  26  GLUCOSE 128* 250*  --  165*  BUN 13 21  --  23  CREATININE 5.19* 6.42*  --  6.91*  CALCIUM 10.2 9.7  --  9.2  AST 15 19  --  15  ALT 11 9  --  9  ALKPHOS 84 93  --  86  BILITOT 0.6 0.6  --  0.6  ALBUMIN 3.4* 2.7*  --  2.3*  MG  --   --  1.8  --   CRP  --   --  20.4*  --   PROCALCITON  --   --  2.03 1.62  LATICACIDVEN  --   --  1.1  --   INR   --   --   --  1.0  TSH  --   --  0.760  --   BNP  --   --  3,491.7*  --     ------------------------------------------------------------------------------------------------------------------ No results for input(s): "CHOL", "HDL", "LDLCALC", "TRIG", "CHOLHDL", "LDLDIRECT" in the last 72 hours.  Lab Results  Component Value Date   HGBA1C 6.4 (H) 08/04/2021   ------------------------------------------------------------------------------------------------------------------ Recent Labs    08/03/22 0008  TSH 0.760    Cardiac Enzymes No results for input(s): "CKMB", "TROPONINI", "MYOGLOBIN" in the last 168 hours.  Invalid input(s): "CK" ------------------------------------------------------------------------------------------------------------------    Component Value Date/Time   BNP 1,676.4 (H) 08/03/2022 0008    CBG: Recent Labs  Lab 08/03/22 0024 08/03/22 0820  GLUCAP 149* 151*    No results found for this or any previous visit (from the past 240 hour(s)).   Radiology Studies: CT ABDOMEN PELVIS W CONTRAST  Result Date: 08/26/2022 CLINICAL DATA:  Right groin and right abdominal pain. EXAM: CT ABDOMEN AND PELVIS WITH CONTRAST TECHNIQUE: Multidetector CT imaging of the abdomen and pelvis was performed using the standard protocol following bolus administration of intravenous contrast. RADIATION DOSE REDUCTION: This exam was performed according to the departmental dose-optimization program which includes automated exposure control, adjustment of the mA and/or kV according to patient size and/or use of iterative reconstruction technique. CONTRAST:  59m OMNIPAQUE IOHEXOL 350 MG/ML SOLN COMPARISON:  07/31/2022 FINDINGS: Lower chest: No acute abnormality. Hepatobiliary: Cholelithiasis without pericholecystic inflammatory change noted. The liver is unremarkable. No intra or extrahepatic biliary ductal dilation. Pancreas: Unremarkable Spleen: Unremarkable Adrenals/Urinary Tract: The  adrenal glands are unremarkable. The kidneys are normal in position. There is mild to moderate renal cortical atrophy bilaterally. Multiple simple cortical cysts are seen within the kidneys bilaterally. Within the upper pole of the left kidney, however, a hypoenhancing 2.1 cm lesion is identified suspicious for AA primary renal neoplasm. Vascular calcifications noted within the renal hila bilaterally. No definite urinary renal or ureteral calculi. No hydronephrosis. The bladder is unremarkable. Stomach/Bowel: The stomach, small bowel, and large bowel are unremarkable. The previously noted loculated collection along the right pelvic sidewall is again identified and is  enlarged, measuring 3.9 x 5.3 cm at axial image # 69/3. As noted on prior examination, there is irregular mural enhancement, enhancing internal septa, and mild surrounding inflammatory stranding. Additionally, on the current examination, there has developed gas within the superior aspect of the mass, best seen on axial image # 68/3. When compared to remote prior examination of 10/15/2020, this is seen immediately adjacent to the appendix and complex right adnexal cystic lesion. Altogether, the findings are suspicious for a perforated appendicitis with a periappendiceal abscess with secondary involvement of the previously noted adnexal mass or a tubo-ovarian abscess. A malignant lesion is considered less likely given its relatively rapid evolution though a secondarily infected malignancy could appear in this fashion. No free intraperitoneal gas or fluid. No evidence of obstruction. Vascular/Lymphatic: Extensive aortoiliac atherosclerotic calcification. Extensive visceral arteriosclerosis. 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 Bilateral femoral-distal bypass grafts are partially  visualized. No pathologic adenopathy within the abdomen and pelvis. Reproductive: Uterus absent.  No adnexal masses on the left. Other: Small right femoral hernia is again identified though bowel within the hernia sac noted on prior examination has spontaneously reduced in the interval. Musculoskeletal: Degenerative changes are seen within the lumbar spine. No acute bone abnormality. No lytic or blastic bone lesion. IMPRESSION: 1. Interval enlargement of the previously noted loculated collection along the right pelvic sidewall with irregular mural enhancement, enhancing internal septa, and mild surrounding inflammatory stranding. Additionally, there has developed gas within the superior aspect of the mass, best seen on axial image # 68/3. When compared to remote prior examination of 10/15/2020, this is seen immediately adjacent to the appendix and complex right adnexal cystic lesion. Altogether, the findings are suspicious for a perforated appendicitis with a periappendiceal abscess with probable secondary involvement of the previously noted adnexal mass or a tubo-ovarian abscess. A malignant lesion is considered less likely given its relatively rapid evolution though a secondarily infected malignancy could appear in this fashion. Surgical consultation is recommended. 2. 2.1 cm hypoenhancing lesion within the upper pole of the left kidney suspicious for a primary renal neoplasm. Dedicated renal mass protocol CT or MRI examination is recommended for evaluation once the patient's acute issues have resolved. 3. Cholelithiasis. 4. Small right fat containing femoral hernia. Previously noted bowel within the hernia sac has spontaneously reduced. Aortic Atherosclerosis (ICD10-I70.0). Electronically Signed   By: Fidela Salisbury M.D.   On: 08/14/2022 20:35   US Abdomen Limited RUQ (LIVER/GB)  Result Date: 08/15/2022 CLINICAL DATA:  Right-sided abdominal pain EXAM: ULTRASOUND ABDOMEN LIMITED RIGHT UPPER QUADRANT COMPARISON:   CT abdomen and pelvis 07/31/2022 FINDINGS: Gallbladder: Small gallstones are present. There is no gallbladder wall thickening or pericholecystic fluid. Sonographic Percell Miller sign is positive per sonographer. Common bile duct: Diameter: 5.1 mm Liver: No focal lesion identified. Increase in parenchymal echogenicity. Portal vein is patent on color Doppler imaging with normal direction of blood flow towards the liver. Other: 1.4 x 2.2 x 1.4 cm right renal cyst. Right kidney appears echogenic. IMPRESSION: 1. Cholelithiasis with positive sonographic Murphy sign. No gallbladder wall thickening or pericholecystic fluid. Findings are equivocal for acute cholecystitis. 2. Increased echogenicity of the liver. This is a nonspecific finding but may be seen in the setting of hepatic steatosis. 3. Echogenic right kidney, suggesting medical renal disease. Electronically Signed   By: Ronney Asters M.D.   On: 08/12/2022 18:35  Marzetta Board, MD, PhD [facility]  Between 7 am - 7 pm I am available, please contact me via Amion (for emergencies) or Securechat (non urgent messages)  Between 7 pm - 7 am I am not available, please contact night coverage MD/APP via Amion

## 2022-08-03 NOTE — Consult Note (Signed)
Renal Service Consult Note [facility]  Destiny Day 08/03/2022 Sol Blazing, MD Requesting Physician: Dr Cruzita Lederer  Reason for Consult: ESRD w/ abdominal pain and possible abscess HPI: The patient is a 72 y.o. year-old w/ PMH as below who presented to ED w/ abd pain x 1 week. In ED wbc 13k and CT showed findings suggestive of appendicitis vs periappendiceal abscess. Pt admitted and started on IV abx. IR and gen surgery are consulting. We are asked to see for dialysis.   Pt seen in room, pleasant, no c/o's at this time.   ROS - denies CP, no joint pain, no HA, no blurry vision, no rash, no diarrhea, no nausea/ vomiting, no dysuria, no difficulty voiding   Past Medical History  Past Medical History:  Diagnosis Date   Abdominal bruit    Anxiety    Arthritis    Osteoarthritis   Asthma    Cervical disc disease    "pinced nerve"   CHF (congestive heart failure) (HCC)    COPD (chronic obstructive pulmonary disease) (HCC)    Diabetes mellitus    Type II   Diverticulitis    ESRD (end stage renal disease) ([place])    dialysis - M/W/F- [place]   GERD (gastroesophageal reflux disease)    from medications   GI bleed 03/31/2013   Head injury 07/2017   History of hiatal hernia    Hyperlipidemia    Hypertension    Neuropathy    left leg   Nuclear sclerotic cataract of right eye 04/23/2020   Osteoporosis    Peripheral vascular disease ([place])    Pneumonia    "very young" and a few years ago   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)    Vitrectomy membrane peel endolaser 05-07-2021   Seasonal allergies    Shortness of breath dyspnea    WIth exertion, when fluid builds   Sleep apnea    can't afford cpap    Vitreomacular traction syndrome, 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   05-07-2021  Vitrectomy, membrane peel, PRP, resection posterior segment of NVD, remnants of peripheral NVE remain in place due to atrophic  retina, no vitreous substitute   Past Surgical History  Past Surgical History:  Procedure Laterality Date   A/V SHUNTOGRAM N/A 09/22/2016   Procedure: A/V Shuntogram - left arm;  Surgeon: Serafina Mitchell, MD;  Location: [place] [facility];  Service: Cardiovascular;  Laterality: N/A;   A/V SHUNTOGRAM N/A 03/22/2018   Procedure: A/V SHUNTOGRAM - left arm;  Surgeon: Serafina Mitchell, MD;  Location: [place] [facility];  Service: Cardiovascular;  Laterality: N/A;   A/V SHUNTOGRAM Left 11/17/2018   Procedure: A/V SHUNTOGRAM;  Surgeon: Angelia Mould, MD;  Location: [place] [facility];  Service: Cardiovascular;  Laterality: Left;   ABDOMINAL HYSTERECTOMY  1993`   AMPUTATION Left 09/01/2016   Procedure: LEFT FOOT TRANSMETATARSAL AMPUTATION;  Surgeon: Newt Minion, MD;  Location: [place];  Service: Orthopedics;  Laterality: Left;   AMPUTATION Right 08/11/2017   Procedure: RIGHT GREAT TOE AMPUTATION DIGIT;  Surgeon: Rosetta Posner, MD;  Location: [place];  Service: Vascular;  Laterality: Right;   AMPUTATION Right 12/31/2017   Procedure: RIGHT TRANSMETATARSAL AMPUTATION;  Surgeon: Newt Minion, MD;  Location: [place];  Service: Orthopedics;  Laterality: Right;   AV FISTULA PLACEMENT Left 04/21/2016   Procedure: INSERTION OF ARTERIOVENOUS (AV) GORE-TEX GRAFT ARM LEFT;  Surgeon: Elam Dutch, MD;  Location:  [place] [facility];  Service: Vascular;  Laterality: Left;   BASCILIC VEIN TRANSPOSITION Left 07/10/2014   Procedure: BASCILIC VEIN TRANSPOSITION;  Surgeon: Angelia Mould, MD;  Location: [place];  Service: Vascular;  Laterality: Left;   [place] Right 11/08/2014   Procedure: FIRST STAGE BASILIC VEIN TRANSPOSITION;  Surgeon: Angelia Mould, MD;  Location: [place];  Service: Vascular;  Laterality: Right;   [place] Right 01/18/2015   Procedure: SECOND STAGE BASILIC VEIN TRANSPOSITION;  Surgeon: Angelia Mould, MD;  Location: [place];  Service: Vascular;  Laterality:  Right;   CRANIOTOMY N/A 08/23/2017   Procedure: CRANIOTOMY HEMATOMA EVACUATION SUBDURAL;  Surgeon: Ashok Pall, MD;  Location: [place];  Service: Neurosurgery;  Laterality: N/A;   CRANIOTOMY Left 08/24/2017   Procedure: CRANIOTOMY FOR RECURRENT ACUTE SUBDURAL HEMATOMA;  Surgeon: Ashok Pall, MD;  Location: [place];  Service: Neurosurgery;  Laterality: Left;   ESOPHAGOGASTRODUODENOSCOPY N/A 03/31/2013   Procedure: ESOPHAGOGASTRODUODENOSCOPY (EGD);  Surgeon: Gatha Mayer, MD;  Location: [facility] ENDOSCOPY;  Service: Endoscopy;  Laterality: N/A;   EYE SURGERY     laser surgery   FEMORAL-POPLITEAL BYPASS GRAFT Left 07/08/2016   Procedure: LEFT  FEMORAL-BELOW KNEE POPLITEAL ARTERY BYPASS GRAFT USING 6MM X 80 CM PROPATEN GORETEX GRAFT WITH RINGS.;  Surgeon: Rosetta Posner, MD;  Location: [facility] OR;  Service: Vascular;  Laterality: Left;   FEMORAL-POPLITEAL BYPASS GRAFT Right 08/11/2017   Procedure: RIGHT FEMORAL TO BELOW KNEE POPLITEAL ARTERKY  BYPASS GRAFT USING 6MM RINGED PROPATEN GRAFT;  Surgeon: Rosetta Posner, MD;  Location: [place];  Service: Vascular;  Laterality: Right;   FISTULOGRAM Left 10/29/2014   Procedure: FISTULOGRAM;  Surgeon: Angelia Mould, MD;  Location: [facility] CATH LAB;  Service: Cardiovascular;  Laterality: Left;   GAS/FLUID EXCHANGE Left 12/20/2018   Procedure: AIR GAS EXCHANGE;  Surgeon: Jalene Mullet, MD;  Location: [place];  Service: Ophthalmology;  Laterality: Left;   INSERTION OF DIALYSIS CATHETER N/A 09/05/2020   Procedure: INSERTION OF DIALYSIS CATHETER;  Surgeon: Cherre Robins, MD;  Location: [place];  Service: Vascular;  Laterality: N/A;   INSERTION OF DIALYSIS CATHETER Left 03/13/2021   Procedure: INSERTION OF DIALYSIS CATHETER;  Surgeon: Waynetta Sandy, MD;  Location: [place];  Service: Vascular;  Laterality: Left;   KNEE ARTHROSCOPY Right 05/06/2018   Procedure: RIGHT KNEE ARTHROSCOPY;  Surgeon: Newt Minion, MD;  Location: [place];  Service: Orthopedics;  Laterality: Right;    KNEE ARTHROSCOPY Right 06/10/2018   Procedure: RIGHT KNEE ARTHROSCOPY AND DEBRIDEMENT;  Surgeon: Newt Minion, MD;  Location: [place];  Service: Orthopedics;  Laterality: Right;   LIGATION OF ARTERIOVENOUS  FISTULA Left 04/21/2016   Procedure: LIGATION OF ARTERIOVENOUS  FISTULA LEFT ARM;  Surgeon: Elam Dutch, MD;  Location: [place];  Service: Vascular;  Laterality: Left;   LOWER EXTREMITY ANGIOGRAPHY N/A 04/06/2017   Procedure: Lower Extremity Angiography - Right;  Surgeon: Serafina Mitchell, MD;  Location: [place] [facility];  Service: Cardiovascular;  Laterality: N/A;   MEMBRANE PEEL Left 12/20/2018   Procedure: MEMBRANE PEEL;  Surgeon: Jalene Mullet, MD;  Location: [place];  Service: Ophthalmology;  Laterality: Left;   MEMBRANE PEEL Right 05/29/2022   Procedure: MEMBRANE PEEL;  Surgeon: Hurman Horn, MD;  Location: [place];  Service: Ophthalmology;  Laterality: Right;   PARS PLANA VITRECTOMY Right 05/29/2022   Procedure: PARS PLANA VITRECTOMY WITH 25 GAUGE;  Surgeon: Hurman Horn, MD;  Location: [place];  Service:  Ophthalmology;  Laterality: Right;   PATCH ANGIOPLASTY Right 01/18/2015   Procedure: BASILIC VEIN PATCH ANGIOPLASTY USING VASCUGUARD PATCH;  Surgeon: Angelia Mould, MD;  Location: [place];  Service: Vascular;  Laterality: Right;   PERIPHERAL VASCULAR BALLOON ANGIOPLASTY  09/22/2016   Procedure: Peripheral Vascular Balloon Angioplasty;  Surgeon: Serafina Mitchell, MD;  Location: [place] [facility];  Service: Cardiovascular;;  Lt. Fistula   PERIPHERAL VASCULAR BALLOON ANGIOPLASTY Left 03/22/2018   Procedure: PERIPHERAL VASCULAR BALLOON ANGIOPLASTY;  Surgeon: Serafina Mitchell, MD;  Location: [place] [facility];  Service: Cardiovascular;  Laterality: Left;  Arm shunt   PERIPHERAL VASCULAR CATHETERIZATION N/A 06/23/2016   Procedure: Abdominal Aortogram w/Lower Extremity;  Surgeon: Serafina Mitchell, MD;  Location: [place] [facility];  Service: Cardiovascular;  Laterality: N/A;    PERIPHERAL VASCULAR CATHETERIZATION  06/23/2016   Procedure: Peripheral Vascular Intervention;  Surgeon: Serafina Mitchell, MD;  Location: [place] [facility];  Service: Cardiovascular;;  lt common and external illiac artery   PHOTOCOAGULATION WITH LASER Left 12/20/2018   Procedure: PHOTOCOAGULATION WITH LASER;  Surgeon: Jalene Mullet, MD;  Location: [place];  Service: Ophthalmology;  Laterality: Left;   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   Procedure: REPAIR OF COMPLEX TRACTION RETINAL DETACHMENT;  Surgeon: Jalene Mullet, MD;  Location: [place];  Service: Ophthalmology;  Laterality: Left;   REVISION OF ARTERIOVENOUS GORETEX GRAFT Left 09/05/2020   Procedure: REVISION OF ARTERIOVENOUS GORETEX GRAFT LEFT;  Surgeon: Cherre Robins, MD;  Location: [facility] OR;  Service: Vascular;  Laterality: Left;   Family History  Family History  Problem Relation Age of Onset   Other Mother        not sure of cause of death   Diabetes Father    Diabetes Sister    Diabetes Sister    Pancreatic cancer Maternal Grandmother    Colon cancer Neg Hx    Social History  reports that she quit smoking about 10 years ago. Her smoking use included cigarettes. She has a 14.00 pack-year smoking history. She has never used smokeless tobacco. She reports that she does not drink alcohol and does not use drugs. Allergies  Allergies  Allergen Reactions   Adhesive  [Tape] Hives   Lactose Intolerance (Gi) Diarrhea   Lisinopril Cough   Prednisone Swelling and Other (See Comments)    Excessive fluid buildup   Home medications Prior to Admission medications   Medication Sig Start Date End Date Taking? Authorizing Provider  albuterol (VENTOLIN HFA) 108 (90 Base) MCG/ACT inhaler Inhale 2 puffs into the lungs every 4 (four) hours as needed for wheezing or shortness of breath. 07/21/22  Yes Piontek, Erin, MD  amLODipine (NORVASC) 10 MG tablet Take 10 mg by mouth daily. 02/09/22  Yes [provider]   aspirin EC 81 MG tablet Take 81 mg by mouth daily.   Yes [provider]  atorvastatin (LIPITOR) 40 MG tablet Take 40 mg by mouth at bedtime. 07/17/21  Yes [provider]  b complex-vitamin c-folic acid (NEPHRO-VITE) 0.8 MG TABS tablet Take 1 tablet by mouth at bedtime.   Yes [provider]  busPIRone (BUSPAR) 7.5 MG tablet Take 7.5 mg by mouth daily as needed (anxiety). 10/29/19  Yes [provider]  carvedilol (COREG) 12.5 MG tablet Take 12.5 mg by mouth See admin instructions. Take 1 tablet (12.5 mg) by mouth once daily in the evening on Mondays, Wednesdays, & Fridays. (Dialysis) Take 1 tablet (12.5 mg) by mouth  twice daily on Sundays, Tuesdays, Thursdays, & Saturdays. (Non Dialysis)   Yes [provider]  cinacalcet (SENSIPAR) 60 MG tablet Take 60 mg by mouth See admin instructions. Take 1 tablet (60 mg) by mouth in the evening on Mondays, Wednesdays, & Fridays (dialysis) Take 1 tablet (60 mg) by mouth twice daily on Sundays, Tuesdays, Thursdays, Saturdays. (non dialysis) 11/28/18  Yes [provider]  diphenhydrAMINE HCl (BENADRYL ALLERGY PO) Take 1 capsule by mouth every Monday, Wednesday, and Friday with hemodialysis. If needed to help with allergies   Yes [provider]  Doxercalciferol (HECTOROL IV) Doxercalciferol (Hectorol) 08/06/21 08/05/22 Yes [provider]  ferric citrate (AURYXIA) 1 GM 210 MG(Fe) tablet Take 210-420 mg by mouth See admin instructions. Take 2 tablets (420 mg) by mouth with each meal & take 1 tablet (210 mg) by mouth with snacks.   Yes [provider]  fluticasone (FLONASE) 50 MCG/ACT nasal spray Place 2 sprays into both nostrils daily as needed for allergies or rhinitis. 04/07/22  Yes Valentina Shaggy, MD  gabapentin (NEURONTIN) 100 MG capsule TAKE 1 CAPSULE (100 MG TOTAL) BY MOUTH AT BEDTIME. WHEN NECESSARY FOR NEUROPATHY PAIN Patient taking differently: Take 100 mg by mouth at bedtime  as needed (When necessary for neuropathy pain). 12/13/18  Yes Newt Minion, MD  glipiZIDE (GLUCOTROL) 5 MG tablet Take 5 mg by mouth at bedtime.   Yes [provider]  isosorbide-hydrALAZINE (BIDIL) 20-37.5 MG tablet Take 1 tablet by mouth See admin instructions. hs on Monday,Wednesday,Friday (dialysis days) Tid on Tuesday,Thursday,Saturday,Sunday(non-dialysis days) Patient taking differently: Take 1 tablet by mouth See admin instructions. Take in the evening on Monday,Wednesday,Friday (dialysis days) BID on Tuesday,Thursday,Saturday,Sunday(non-dialysis days) 01/08/22  Yes Cantwell, Celeste C, PA-C  lidocaine-prilocaine (EMLA) cream Apply 1 Application topically as needed. 03/31/22  Yes Vanessa Kick, MD  loperamide (IMODIUM) 2 MG capsule Take 2 capsules (4 mg total) by mouth 3 (three) times daily as needed for diarrhea or loose stools. 08/04/21  Yes Bonnielee Haff, MD  Methoxy PEG-Epoetin Beta (MIRCERA IJ) Mircera 08/20/21 10/23/22 Yes [provider]  montelukast (SINGULAIR) 10 MG tablet TAKE 1 TABLET BY MOUTH EVERYDAY AT BEDTIME Patient taking differently: Take 10 mg by mouth at bedtime. 04/07/22  Yes Valentina Shaggy, MD  omeprazole (PRILOSEC) 20 MG capsule Take 20 mg by mouth daily as needed (for acid reflux).   Yes [provider]  ondansetron (ZOFRAN) 4 MG tablet Take 1 tablet (4 mg total) by mouth every 6 (six) hours as needed for nausea. 05/22/19  Yes Aline August, MD  promethazine-dextromethorphan (PROMETHAZINE-DM) 6.25-15 MG/5ML syrup Take 5 mLs by mouth 4 (four) times daily as needed for cough. 07/21/22  Yes Piontek, Junie Panning, MD  traZODone (DESYREL) 50 MG tablet Take 25-50 mg by mouth at bedtime as needed for sleep. 10/29/19  Yes [provider]  ursodiol (ACTIGALL) 250 MG tablet Take 250 mg by mouth 2 (two) times daily. 07/28/22  Yes [provider]  budesonide-formoterol (SYMBICORT) 160-4.5 MCG/ACT inhaler Inhale 2 puffs into the lungs 2 (two)  times daily. Patient not taking: Reported on 05/28/2022 04/07/22   Valentina Shaggy, MD  EPINEPHrine (AUVI-Q) 0.3 mg/0.3 mL IJ SOAJ injection Inject 0.3 mg into the muscle as needed for anaphylaxis. Patient not taking: Reported on 08/03/2022 12/24/20   Valentina Shaggy, MD  fluticasone-salmeterol (ADVAIR [facility]) [id number] MCG/ACT inhaler Inhale two puffs twice daily Patient not taking: Reported on 05/28/2022 12/24/20   Valentina Shaggy, MD  latanoprost (XALATAN) 0.005 %  ophthalmic solution Place 1 drop into the left eye at bedtime. Patient not taking: Reported on 05/28/2022 11/25/21 11/25/22  Rankin, Clent Demark, MD  levocetirizine (XYZAL) 5 MG tablet Take 1 tablet (5 mg total) by mouth every evening. Patient not taking: Reported on 08/03/2022 04/07/22   Valentina Shaggy, MD  ofloxacin (OCUFLOX) 0.3 % ophthalmic solution Place 1 drop into the right eye 4 (four) times daily. Patient not taking: Reported on 08/03/2022 05/27/22   [provider]  prednisoLONE acetate (PRED FORTE) 1 % ophthalmic suspension Place 1 drop into the right eye 4 (four) times daily. Patient not taking: Reported on 08/03/2022 05/27/22   [provider]  Spacer/Aero-Holding Chambers (AEROCHAMBER PLUS) inhaler Use with inhaler 03/09/21   Melynda Ripple, MD     Vitals:   08/03/22 0830 08/03/22 0845 08/03/22 0910 08/03/22 1046  BP: 124/89 127/67  (!) 150/43  Pulse: 98 97  95  Resp: 17 (!) 27  18  Temp:   98.5 F (36.9 C)   TempSrc:   Oral   SpO2: 95% 96%  97%  Weight:      Height:       Exam Gen alert, no distress. On RA No rash, cyanosis or gangrene Sclera anicteric, throat clear  No jvd or bruits Chest clear bilat to bases, no rales/ wheezing RRR no MRG Abd soft ntnd no mass or ascites +bs GU defer MS no joint effusions or deformity Ext no LE or UE edema, no wounds or ulcers Neuro is alert, Ox 3 , nf    LUA AVG+bruit    Home meds include - norvasc, aspririn, lipitor, buspar, coreg 12.5 1-2x per  day, sensipar, gabapentin 100 hs, glucotrol, isosorbide-hydralazine, prilosec, trazodone, ursodiol, symbicort, advair, eyedrops/ prns/ vits/ supps     OP HD: [place] MWF  4h  400/600   57.4kg   2/3.5 bath LUA AVG  Heparin 2500+ 109mdrun - last HD 1/5, post wt 57.3kg - hectorol 7 ug IV tiw - venofer 50 q week - mircera 30 ug q4 wks, last 12/8, due now   Assessment/ Plan: Abd pain/ intra-abdominal infection or abscess - per CT imaging. Gen surg and IR consulted, started on IV abx. ESRD - on HD MWF. Will plan on HD tonight or possibly tomorrow am. Labs and vol are stable.  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, follow MBD ckd - CCa in range, add on phos DM 2 - per pmd COPD/ asthma - no symptoms   Rob Taishawn Smaldone  MD 08/03/2022, 11:22 AM   Recent Labs  Lab 08/13/2022 1730 08/03/22 0008 08/03/22 0422  HGB 11.6* 10.9* 10.9*  ALBUMIN 2.7*  --  2.3*  CALCIUM 9.7  --  9.2  CREATININE 6.42*  --  6.91*  K 3.3*  --  3.2*   Inpatient medications:  atorvastatin  40 mg Oral QHS   carvedilol  12.5 mg Oral See admin instructions   fluticasone furoate-vilanterol  1 puff Inhalation Daily   gabapentin  100 mg Oral QHS   insulin aspart  0-6 Units Subcutaneous TID WC   isosorbide-hydrALAZINE  1 tablet Oral See admin instructions   pantoprazole (PROTONIX) IV  40 mg Intravenous Q12H   ursodiol  250 mg Oral BID    acetaminophen     piperacillin-tazobactam Stopped (08/03/22 0606)   acetaminophen, albuterol, busPIRone, fentaNYL (SUBLIMAZE) injection, fluticasone, hydrALAZINE, HYDROmorphone (DILAUDID) injection, ondansetron, oxyCODONE, oxyCODONE, traZODone

## 2022-08-03 NOTE — Procedures (Signed)
Interventional Radiology Procedure Note  Procedure: Image guided drain placement, RLQ abscess.  21F pigtail drain.  Complications: None  EBL: None Sample: Culture sent  Recommendations: - Routine drain care, with sterile flushes, record output - follow up Cx - routine wound care  Signed,  Dulcy Fanny. Earleen Newport, DO

## 2022-08-04 ENCOUNTER — Inpatient Hospital Stay (HOSPITAL_COMMUNITY): Payer: Medicare Other

## 2022-08-04 DIAGNOSIS — R0902 Hypoxemia: Secondary | ICD-10-CM | POA: Diagnosis not present

## 2022-08-04 DIAGNOSIS — K659 Peritonitis, unspecified: Secondary | ICD-10-CM | POA: Diagnosis not present

## 2022-08-04 LAB — COMPREHENSIVE METABOLIC PANEL
ALT: 10 U/L (ref 0–44)
AST: 15 U/L (ref 15–41)
Albumin: 2.2 g/dL — ABNORMAL LOW (ref 3.5–5.0)
Alkaline Phosphatase: 86 U/L (ref 38–126)
Anion gap: 14 (ref 5–15)
BUN: 32 mg/dL — ABNORMAL HIGH (ref 8–23)
CO2: 25 mmol/L (ref 22–32)
Calcium: 9.6 mg/dL (ref 8.9–10.3)
Chloride: 92 mmol/L — ABNORMAL LOW (ref 98–111)
Creatinine, Ser: 8.1 mg/dL — ABNORMAL HIGH (ref 0.44–1.00)
GFR, Estimated: 5 mL/min — ABNORMAL LOW (ref >60)
Glucose, Bld: 148 mg/dL — ABNORMAL HIGH (ref 70–99)
Potassium: 4.4 mmol/L (ref 3.5–5.1)
Sodium: 131 mmol/L — ABNORMAL LOW (ref 135–145)
Total Bilirubin: 0.6 mg/dL (ref 0.3–1.2)
Total Protein: 6.2 g/dL — ABNORMAL LOW (ref 6.5–8.1)

## 2022-08-04 LAB — PHOSPHORUS: Phosphorus: 6.6 mg/dL — ABNORMAL HIGH (ref 2.5–4.6)

## 2022-08-04 LAB — PROCALCITONIN: Procalcitonin: 2.02 ng/mL

## 2022-08-04 LAB — CBC
HCT: 32.2 % — ABNORMAL LOW (ref 36.0–46.0)
Hemoglobin: 10.4 g/dL — ABNORMAL LOW (ref 12.0–15.0)
MCH: 32.1 pg (ref 26.0–34.0)
MCHC: 32.3 g/dL (ref 30.0–36.0)
MCV: 99.4 fL (ref 80.0–100.0)
Platelets: 288 10*3/uL (ref 150–400)
RBC: 3.24 MIL/uL — ABNORMAL LOW (ref 3.87–5.11)
RDW: 13.2 % (ref 11.5–15.5)
WBC: 24.8 10*3/uL — ABNORMAL HIGH (ref 4.0–10.5)
nRBC: 0 % (ref 0.0–0.2)

## 2022-08-04 LAB — GLUCOSE, CAPILLARY
Glucose-Capillary: 239 mg/dL — ABNORMAL HIGH (ref 70–99)
Glucose-Capillary: 233 mg/dL — ABNORMAL HIGH (ref 70–99)

## 2022-08-04 LAB — CBG MONITORING, ED
Glucose-Capillary: 152 mg/dL — ABNORMAL HIGH (ref 70–99)
Glucose-Capillary: 121 mg/dL — ABNORMAL HIGH (ref 70–99)

## 2022-08-04 LAB — HEPATITIS B SURFACE ANTIGEN: Hepatitis B Surface Ag: NONREACTIVE

## 2022-08-04 LAB — MAGNESIUM: Magnesium: 2.1 mg/dL (ref 1.7–2.4)

## 2022-08-04 MED ORDER — ONDANSETRON HCL 4 MG/2ML IJ SOLN
4.0000 mg | Freq: Once | INTRAMUSCULAR | Status: DC
  Filled 2022-08-04: qty 2

## 2022-08-04 MED ORDER — ONDANSETRON HCL 4 MG/2ML IJ SOLN
4.0000 mg | Freq: Four times a day (QID) | INTRAMUSCULAR | Status: DC | PRN
  Administered 2022-08-04 – 2022-08-26 (×8): 4 mg via INTRAVENOUS
  Filled 2022-08-04 (×8): qty 2

## 2022-08-04 MED ORDER — DOXERCALCIFEROL 4 MCG/2ML IV SOLN: 7.0000 ug | INTRAVENOUS | Status: DC

## 2022-08-04 MED ORDER — CHLORHEXIDINE GLUCONATE CLOTH 2 % EX PADS
6.0000 | MEDICATED_PAD | Freq: Every day | CUTANEOUS | Status: DC
  Administered 2022-08-05 – 2022-08-09 (×5): 6 via TOPICAL

## 2022-08-04 MED ORDER — DOXERCALCIFEROL 4 MCG/2ML IV SOLN
7.0000 ug | INTRAVENOUS | Status: DC
  Administered 2022-08-05 – 2022-08-26 (×9): 7 ug via INTRAVENOUS
  Filled 2022-08-04 (×9): qty 4

## 2022-08-04 MED ORDER — PROCHLORPERAZINE EDISYLATE 10 MG/2ML IJ SOLN
10.0000 mg | Freq: Four times a day (QID) | INTRAMUSCULAR | Status: DC | PRN
  Administered 2022-08-06: 10 mg via INTRAVENOUS
  Filled 2022-08-04: qty 2

## 2022-08-04 NOTE — Progress Notes (Signed)
PROGRESS NOTE  TRISTEN PENNINO PJA:[id number] DOB: 1950-09-10 DOA: 08/17/2022 PCP: Cipriano Mile, NP   LOS: 2 days   Brief Narrative / Interim history: 72 year old female with history of ESRD on MWF HD, asthma/COPD, diastolic CHF, HTN, HLD, DM2, comes into the hospital with few days of right lower quadrant abdominal pain.  Imaging in the ED concerning for perforated appendicitis with a periappendiceal abscess with probably secondary involvement of the previously noted adnexal mass or a tubo-ovarian abscess.  She was placed on antibiotics, general surgery was consulted and she was admitted to the hospital.  Subjective / 24h Interval events: Seen in dialysis, complains of nausea.  She feels "sick to her stomach".  Assesement and Plan: Principal problem Intra-abdominal abscess, concern for perforated appendicitis -patient started on antibiotics with Zosyn, continue.  General surgery consulted, IR consulted and she is status post right lower quadrant abscess drain placement.  Microbiology so far shows GNR, GPC in chains and GPR.  Continue Zosyn for now, tailor antibiotics based on speciation and sensitivities -Still with significant leukocytosis, white count 24.8 this morning.  Procalcitonin is high at 2 as well.  Active problems Essential hypertension-resume home medications.  ESRD on HD-nephrology consulted, contacted Dr. Jonnie Finner, undergoing dialysis this morning  Hyperlipidemia-resume home statin  Chronic diastolic CHF -appears euvolemic, volume management per HD  Anxiety-continue home BuSpar as needed  Asthma/COPD-stable, no wheezing, no exacerbation  Hypokalemia-replete and continue to monitor, potassium normalized this morning  Hyponatremia-fluid status managed with HD  DM2-continue sliding scale.  CBG (last 3)  Recent Labs    08/03/22 1336 08/03/22 1739 08/04/22 0111  GLUCAP 96 121* 152*     Scheduled Meds:  atorvastatin  40 mg Oral QHS   carvedilol  12.5 mg Oral  Q M,W,F-HD   carvedilol  12.5 mg Oral 2 times per day on Tue Thu Sat   Chlorhexidine Gluconate Cloth  6 each Topical Q0600   fluticasone furoate-vilanterol  1 puff Inhalation Daily   gabapentin  100 mg Oral QHS   insulin aspart  0-6 Units Subcutaneous TID WC   isosorbide-hydrALAZINE  1 tablet Oral 2 times per day on Sun Tue Thu Sat   isosorbide-hydrALAZINE  1 tablet Oral Q M,W,F-HD   ondansetron (ZOFRAN) IV  4 mg Intravenous Once   pantoprazole (PROTONIX) IV  40 mg Intravenous Q12H   sodium chloride flush  5 mL Intracatheter Q8H   ursodiol  300 mg Oral BID   Continuous Infusions:  piperacillin-tazobactam Stopped (08/03/22 2320)   PRN Meds:.albuterol, busPIRone, fentaNYL (SUBLIMAZE) injection, fluticasone, heparin, hydrALAZINE, HYDROmorphone (DILAUDID) injection, ondansetron (ZOFRAN) IV, ondansetron, oxyCODONE, oxyCODONE, prochlorperazine, traZODone  Current Outpatient Medications  Medication Instructions   albuterol (VENTOLIN HFA) 108 (90 Base) MCG/ACT inhaler 2 puffs, Inhalation, Every 4 hours PRN   amLODipine (NORVASC) 10 mg, Oral, Daily   aspirin EC 81 mg, Oral, Daily   atorvastatin (LIPITOR) 40 mg, Oral, Daily at bedtime   b complex-vitamin c-folic acid (NEPHRO-VITE) 0.8 MG TABS tablet 1 tablet, Oral, Daily at bedtime   budesonide-formoterol (SYMBICORT) 160-4.5 MCG/ACT inhaler 2 puffs, Inhalation, 2 times daily   busPIRone (BUSPAR) 7.5 mg, Oral, Daily PRN   carvedilol (COREG) 12.5 mg, Oral, See admin instructions, Take 1 tablet (12.5 mg) by mouth once daily in the evening on Mondays, Wednesdays, & Fridays. (Dialysis)<BR>Take 1 tablet (12.5 mg) by mouth twice daily on Sundays, Tuesdays, Thursdays, & Saturdays. (Non Dialysis)   cinacalcet (SENSIPAR) 60 mg, Oral, See admin instructions, Take 1 tablet (60 mg) by mouth in  the evening on Mondays, Wednesdays, & Fridays (dialysis)<BR>Take 1 tablet (60 mg) by mouth twice daily on 'Sundays, Tuesdays, Thursdays, Saturdays. (non dialysis)    diphenhydrAMINE HCl (BENADRYL ALLERGY PO) 1 capsule, Oral, Every M-W-F (Hemodialysis), If needed to help with allergies   Doxercalciferol (HECTOROL IV) Doxercalciferol (Hectorol)   EPINEPHrine (AUVI-Q) 0.3 mg, Intramuscular, As needed   ferric citrate (AURYXIA) 210-420 mg, Oral, See admin instructions, Take 2 tablets (420 mg) by mouth with each meal & take 1 tablet (210 mg) by mouth with snacks.    fluticasone (FLONASE) 50 MCG/ACT nasal spray 2 sprays, Each Nare, Daily PRN   fluticasone-salmeterol (ADVAIR HFA) 115-21 MCG/ACT inhaler Inhale two puffs twice daily   gabapentin (NEURONTIN) 100 mg, Oral, Daily at bedtime, When necessary for neuropathy pain   glipiZIDE (GLUCOTROL) 5 mg, Oral, Daily at bedtime   isosorbide-hydrALAZINE (BIDIL) 20-37.5 MG tablet 1 tablet, Oral, See admin instructions, hs on Monday,Wednesday,Friday (dialysis days)<BR>Tid on Tuesday,Thursday,Saturday,Sunday(non-dialysis days)   latanoprost (XALATAN) 0.005 % ophthalmic solution 1 drop, Left Eye, Daily at bedtime   levocetirizine (XYZAL) 5 mg, Oral, Every evening   lidocaine-prilocaine (EMLA) cream 1 Application, Topical, As needed   loperamide (IMODIUM) 4 mg, Oral, 3 times daily PRN   Methoxy PEG-Epoetin Beta (MIRCERA IJ) Mircera   montelukast (SINGULAIR) 10 MG tablet TAKE 1 TABLET BY MOUTH EVERYDAY AT BEDTIME   ofloxacin (OCUFLOX) 0.3 % ophthalmic solution 1 drop, 4 times daily   omeprazole (PRILOSEC) 20 mg, Oral, Daily PRN   ondansetron (ZOFRAN) 4 mg, Oral, Every 6 hours PRN   prednisoLONE acetate (PRED FORTE) 1 % ophthalmic suspension 1 drop, 4 times daily   promethazine-dextromethorphan (PROMETHAZINE-DM) 6.25-15 MG/5ML syrup 5 mLs, Oral, 4 times daily PRN   Spacer/Aero-Holding Chambers (AEROCHAMBER PLUS) inhaler Use with inhaler   traZODone (DESYREL) 25-50 mg, Oral, At bedtime PRN   ursodiol (ACTIGALL) 250 mg, Oral, 2 times daily    Diet Orders (From admission, onward)     Start     Ordered   08/04/22 0856  Diet  clear liquid Room service appropriate? Yes; Fluid consistency: Thin; Fluid restriction: 1200 mL Fluid  Diet effective now       Question Answer Comment  Room service appropriate? Yes   Fluid consistency: Thin   Fluid restriction: 1200 mL Fluid      01'$ /09/24 0855            DVT prophylaxis: SCDs Start: 08/01/2022 2213   Lab Results  Component Value Date   PLT 288 08/04/2022      Code Status: Full Code  Family Communication: no family at bedside   Status is: Inpatient  Remains inpatient appropriate because: severity of illness  Level of care: Progressive  Consultants:  General surgery IR Nephrology   Objective: Vitals:   08/04/22 1000 08/04/22 1011 08/04/22 1050 08/04/22 1055  BP: (!) 111/57 (!) 163/74 (!) 144/68   Pulse: 96 64 64   Resp: '16 20 18   '$ Temp:   98.6 F (37 C) 98.7 F (37.1 C)  TempSrc:   Oral Oral  SpO2: 100% 96% 95%   Weight:      Height:        Intake/Output Summary (Last 24 hours) at 08/04/2022 1112 Last data filed at 08/04/2022 1011 Gross per 24 hour  Intake 129.52 ml  Output 2 ml  Net 127.52 ml    Wt Readings from Last 3 Encounters:  08/04/22 63.5 kg  05/29/22 63.5 kg  02/19/22 61.7 kg    Examination:  Constitutional: NAD Eyes: lids and conjunctivae normal, no scleral icterus ENMT: mmm Neck: normal, supple Respiratory: clear to auscultation bilaterally, no wheezing, no crackles. Normal respiratory effort.  Cardiovascular: Regular rate and rhythm, no murmurs / rubs / gallops.  Abdomen: soft, no distention, no tenderness. Bowel sounds positive.  Neurologic: no focal deficits, equal strength  Data Reviewed: I have independently reviewed following labs and imaging studies   CBC Recent Labs  Lab 07/30/22 1821 08/06/2022 1730 08/03/22 0008 08/03/22 0422 08/04/22 0438  WBC 13.7* 19.6*  --  21.7* 24.8*  HGB 13.6 11.6* 10.9* 10.9* 10.4*  HCT 41.9 36.5 34.2* 32.7* 32.2*  PLT 237 243  --  264 288  MCV 97.9 100.0  --  97.0 99.4   MCH 31.8 31.8  --  32.3 32.1  MCHC 32.5 31.8  --  33.3 32.3  RDW 13.0 12.9  --  13.0 13.2  LYMPHSABS 1.3 0.9  --   --   --   MONOABS 0.6 0.7  --   --   --   EOSABS 0.1 0.1  --   --   --   BASOSABS 0.0 0.0  --   --   --      Recent Labs  Lab 07/30/22 1821 07/29/2022 1730 08/03/22 0008 08/03/22 0422 08/04/22 0438  NA 134* 129*  --  130* 131*  K 3.3* 3.3*  --  3.2* 4.4  CL 91* 88*  --  92* 92*  CO2 27 25  --  26 25  GLUCOSE 128* 250*  --  165* 148*  BUN 13 21  --  23 32*  CREATININE 5.19* 6.42*  --  6.91* 8.10*  CALCIUM 10.2 9.7  --  9.2 9.6  AST 15 19  --  15 15  ALT 11 9  --  9 10  ALKPHOS 84 93  --  86 86  BILITOT 0.6 0.6  --  0.6 0.6  ALBUMIN 3.4* 2.7*  --  2.3* 2.2*  MG  --   --  1.8  --  2.1  CRP  --   --  20.4*  --   --   PROCALCITON  --   --  2.03 1.62 2.02  LATICACIDVEN  --   --  1.1  --   --   INR  --   --   --  1.0  --   TSH  --   --  0.760  --   --   HGBA1C  --   --  6.2*  --   --   BNP  --   --  2,595.6*  --   --      ------------------------------------------------------------------------------------------------------------------ No results for input(s): "CHOL", "HDL", "LDLCALC", "TRIG", "CHOLHDL", "LDLDIRECT" in the last 72 hours.  Lab Results  Component Value Date   HGBA1C 6.2 (H) 08/03/2022   ------------------------------------------------------------------------------------------------------------------ Recent Labs    08/03/22 0008  TSH 0.760     Cardiac Enzymes No results for input(s): "CKMB", "TROPONINI", "MYOGLOBIN" in the last 168 hours.  Invalid input(s): "CK" ------------------------------------------------------------------------------------------------------------------    Component Value Date/Time   BNP 1,676.4 (H) 08/03/2022 0008    CBG: Recent Labs  Lab 08/03/22 0024 08/03/22 0820 08/03/22 1336 08/03/22 1739 08/04/22 0111  GLUCAP 149* 151* 96 121* 152*     Recent Results (from the past 240 hour(s))   Aerobic/Anaerobic Culture w Gram Stain (surgical/deep wound)     Status: None (Preliminary result)   Collection Time: 08/03/22  1:26 PM  Specimen: Wound; Abscess  Result Value Ref Range Status   Specimen Description WOUND  Final   Special Requests  ABCESS ABDOMEN  Final   Gram Stain   Final    ABUNDANT WBC PRESENT, PREDOMINANTLY PMN ABUNDANT GRAM NEGATIVE RODS FEW GRAM POSITIVE COCCI IN PAIRS IN CHAINS FEW GRAM POSITIVE RODS    Culture   Final    CULTURE REINCUBATED FOR BETTER GROWTH Performed at [facility], [place] [address] [place], [place] [address]    Report Status PENDING  Incomplete     Radiology Studies: CT GUIDED PERITONEAL/RETROPERITONEAL FLUID DRAIN BY PERC CATH  Result Date: 08/03/2022 INDICATION: 72 year old female referred for drainage right pelvic abscess EXAM: CT-GUIDED DRAINAGE RIGHT PELVIC ABSCESS TECHNIQUE: Multidetector CT imaging of the pelvis was performed following the standard protocol without IV contrast. RADIATION DOSE REDUCTION: This exam was performed according to the departmental dose-optimization program which includes automated exposure control, adjustment of the mA and/or kV according to patient size and/or use of iterative reconstruction technique. MEDICATIONS: The patient is currently admitted to the hospital and receiving intravenous antibiotics. The antibiotics were administered within an appropriate time frame prior to the initiation of the procedure. ANESTHESIA/SEDATION: Moderate (conscious) sedation was employed during this procedure. A total of Versed 1.0 mg and Fentanyl 50 mcg was administered intravenously by the radiology nurse. Total intra-service moderate Sedation Time: 20 minutes. The patient's level of consciousness and vital signs were monitored continuously by radiology nursing throughout the procedure under my direct supervision. COMPLICATIONS: None PROCEDURE: Informed written consent was obtained from the patient after a thorough  discussion of the procedural risks, benefits and alternatives. All questions were addressed. Maximal Sterile Barrier Technique was utilized including caps, mask, sterile gowns, sterile gloves, sterile drape, hand hygiene and skin antiseptic. A timeout was performed prior to the initiation of the procedure. Patient was position supine on the CT gantry table. Scout CT was acquired for planning purposes. The patient was then prepped and draped in usual sterile fashion. 1% lidocaine was used for local anesthesia. A small stab incision was made with 11 blade scalpel. Trocar needle was then advanced into the abscess associated with the right adnexa. Using modified Seldinger technique, 10 French drain was placed. Approximately 30 cc of frankly purulent material aspirated. Culture was sent for analysis. Drain was sutured in position and attached to bulb suction. Patient tolerated the procedure well and remained hemodynamically stable throughout. No complications were encountered and no significant blood loss. IMPRESSION: Status post CT-guided drainage of right pelvic abscess. Signed, Dulcy Fanny. Nadene Rubins, RPVI [facility] Radiology Electronically Signed   By: Corrie Mckusick D.O.   On: 08/03/2022 15:27     Marzetta Board, MD, PhD [facility]  Between 7 am - 7 pm I am available, please contact me via Amion (for emergencies) or Securechat (non urgent messages)  Between 7 pm - 7 am I am not available, please contact night coverage MD/APP via Amion

## 2022-08-04 NOTE — ED Notes (Signed)
Pt transported to dialysis

## 2022-08-04 NOTE — Progress Notes (Signed)
   08/04/22 1050  Vitals  Temp 98.6 F (37 C)  Temp Source Oral  BP (!) 144/68  MAP (mmHg) 91  BP Location Right Arm  BP Method Automatic  Patient Position (if appropriate) Lying  Pulse Rate 64  Pulse Rate Source Monitor  ECG Heart Rate 63  Resp 18  Oxygen Therapy  SpO2 95 %  O2 Device Nasal Cannula  O2 Flow Rate (L/min) 2 L/min  Pulse Oximetry Type Continuous     08/04/22 1050  Vitals  Temp 98.6 F (37 C)  Temp Source Oral  BP (!) 144/68  MAP (mmHg) 91  BP Location Right Arm  BP Method Automatic  Patient Position (if appropriate) Lying  Pulse Rate 64  Pulse Rate Source Monitor  ECG Heart Rate 63  Resp 18  Oxygen Therapy  SpO2 95 %  O2 Device Nasal Cannula  O2 Flow Rate (L/min) 2 L/min  Pulse Oximetry Type Continuous   Received patient in bed to unit.  Alert and oriented.  Informed consent signed and in chart.   Treatment initiated: 0715 Treatment completed: 1011  Patient tolerated well.  Transported back to the room  Alert, without acute distress.  Hand-off given to patient's nurse.   Access used: AVG Access issues: HD nurse that stuck pt had problems sticking and first venous attempt was infiltrated  Total UF removed: 2036m Medication(s) given: Heparin Bolus 2000 units, Zofran '4mg'$  IVP Post HD VS: see above Post HD weight: UTA d/t being on an ED stretcher, pt unable to stand to weigh   [facility]

## 2022-08-04 NOTE — Progress Notes (Addendum)
[facility] Progress Note     Subjective: CC: Nausea Patient reports nausea onset this morning but no abdominal pain. Per nurse she has vomited this morning at dialysis. Patient reports she sometimes has nausea and vomiting during dialysis. Denies passage of stool or gas today but had 2 bowel movements last night.   Objective: Vital signs in last 24 hours: Temp:  [97.6 F (36.4 C)-98.7 F (37.1 C)] 97.6 F (36.4 C) (01/09 0545) Pulse Rate:  [84-109] 84 (01/09 0545) Resp:  [8-27] 27 (01/09 0545) BP: (109-181)/(37-102) 147/59 (01/09 0545) SpO2:  [94 %-100 %] 95 % (01/09 0545) Last BM Date : 08/01/22  Intake/Output from previous day: 01/08 0701 - 01/09 0700 In: 1003 [I.V.:703; IV Piggyback:300] Out: -  Intake/Output this shift: No intake/output data recorded.  PE: Gen:  Alert, NAD, pleasant Abd: Soft, non-tender, non-distended RLQ drain - serosanguinous and purulent drainage in bulb   Lab Results:  Recent Labs    08/03/22 0422 08/04/22 0438  WBC 21.7* 24.8*  HGB 10.9* 10.4*  HCT 32.7* 32.2*  PLT 264 288   BMET Recent Labs    08/03/22 0422 08/04/22 0438  NA 130* 131*  K 3.2* 4.4  CL 92* 92*  CO2 26 25  GLUCOSE 165* 148*  BUN 23 32*  CREATININE 6.91* 8.10*  CALCIUM 9.2 9.6   PT/INR Recent Labs    08/03/22 0422  LABPROT 13.3  INR 1.0   CMP     Component Value Date/Time   NA 131 (L) 08/04/2022 0438   NA 135 (A) 07/15/2016 0000   K 4.4 08/04/2022 0438   CL 92 (L) 08/04/2022 0438   CO2 25 08/04/2022 0438   GLUCOSE 148 (H) 08/04/2022 0438   BUN 32 (H) 08/04/2022 0438   BUN 30 (A) 07/15/2016 0000   CREATININE 8.10 (H) 08/04/2022 0438   CALCIUM 9.6 08/04/2022 0438   PROT 6.2 (L) 08/04/2022 0438   ALBUMIN 2.2 (L) 08/04/2022 0438   AST 15 08/04/2022 0438   ALT 10 08/04/2022 0438   ALKPHOS 86 08/04/2022 0438   BILITOT 0.6 08/04/2022 0438   GFRNONAA 5 (L) 08/04/2022 0438   GFRAA 6 (L) 06/26/2019 0500   Lipase     Component Value  Date/Time   LIPASE 32 08/16/2022 1730       Studies/Results: CT GUIDED PERITONEAL/RETROPERITONEAL FLUID DRAIN BY PERC CATH  Result Date: 08/03/2022 INDICATION: 72 year old female referred for drainage right pelvic abscess EXAM: CT-GUIDED DRAINAGE RIGHT PELVIC ABSCESS TECHNIQUE: Multidetector CT imaging of the pelvis was performed following the standard protocol without IV contrast. RADIATION DOSE REDUCTION: This exam was performed according to the departmental dose-optimization program which includes automated exposure control, adjustment of the mA and/or kV according to patient size and/or use of iterative reconstruction technique. MEDICATIONS: The patient is currently admitted to the hospital and receiving intravenous antibiotics. The antibiotics were administered within an appropriate time frame prior to the initiation of the procedure. ANESTHESIA/SEDATION: Moderate (conscious) sedation was employed during this procedure. A total of Versed 1.0 mg and Fentanyl 50 mcg was administered intravenously by the radiology nurse. Total intra-service moderate Sedation Time: 20 minutes. The patient's level of consciousness and vital signs were monitored continuously by radiology nursing throughout the procedure under my direct supervision. COMPLICATIONS: None PROCEDURE: Informed written consent was obtained from the patient after a thorough discussion of the procedural risks, benefits and alternatives. All questions were addressed. Maximal Sterile Barrier Technique was utilized including caps, mask, sterile gowns, sterile gloves, sterile drape,  hand hygiene and skin antiseptic. A timeout was performed prior to the initiation of the procedure. Patient was position supine on the CT gantry table. Scout CT was acquired for planning purposes. The patient was then prepped and draped in usual sterile fashion. 1% lidocaine was used for local anesthesia. A small stab incision was made with 11 blade scalpel. Trocar needle was  then advanced into the abscess associated with the right adnexa. Using modified Seldinger technique, 10 French drain was placed. Approximately 30 cc of frankly purulent material aspirated. Culture was sent for analysis. Drain was sutured in position and attached to bulb suction. Patient tolerated the procedure well and remained hemodynamically stable throughout. No complications were encountered and no significant blood loss. IMPRESSION: Status post CT-guided drainage of right pelvic abscess. Signed, Dulcy Fanny. Nadene Rubins, RPVI [facility] Radiology Electronically Signed   By: Corrie Mckusick D.O.   On: 08/03/2022 15:27   CT ABDOMEN PELVIS W CONTRAST  Result Date: 08/13/2022 CLINICAL DATA:  Right groin and right abdominal pain. EXAM: CT ABDOMEN AND PELVIS WITH CONTRAST TECHNIQUE: Multidetector CT imaging of the abdomen and pelvis was performed using the standard protocol following bolus administration of intravenous contrast. RADIATION DOSE REDUCTION: This exam was performed according to the departmental dose-optimization program which includes automated exposure control, adjustment of the mA and/or kV according to patient size and/or use of iterative reconstruction technique. CONTRAST:  1m OMNIPAQUE IOHEXOL 350 MG/ML SOLN COMPARISON:  07/31/2022 FINDINGS: Lower chest: No acute abnormality. Hepatobiliary: Cholelithiasis without pericholecystic inflammatory change noted. The liver is unremarkable. No intra or extrahepatic biliary ductal dilation. Pancreas: Unremarkable Spleen: Unremarkable Adrenals/Urinary Tract: The adrenal glands are unremarkable. The kidneys are normal in position. There is mild to moderate renal cortical atrophy bilaterally. Multiple simple cortical cysts are seen within the kidneys bilaterally. Within the upper pole of the left kidney, however, a hypoenhancing 2.1 cm lesion is identified suspicious for AA primary renal neoplasm. Vascular  calcifications noted within the renal hila bilaterally. No definite urinary renal or ureteral calculi. No hydronephrosis. The bladder is unremarkable. Stomach/Bowel: The stomach, small bowel, and large bowel are unremarkable. The previously noted loculated collection along the right pelvic sidewall is again identified and is enlarged, measuring 3.9 x 5.3 cm at axial image # 69/3. As noted on prior examination, there is irregular mural enhancement, enhancing internal septa, and mild surrounding inflammatory stranding. Additionally, on the current examination, there has developed gas within the superior aspect of the mass, best seen on axial image # 68/3. When compared to remote prior examination of 10/15/2020, this is seen immediately adjacent to the appendix and complex right adnexal cystic lesion. Altogether, the findings are suspicious for a perforated appendicitis with a periappendiceal abscess with secondary involvement of the previously noted adnexal mass or a tubo-ovarian abscess. A malignant lesion is considered less likely given its relatively rapid evolution though a secondarily infected malignancy could appear in this fashion. No free intraperitoneal gas or fluid. No evidence of obstruction. Vascular/Lymphatic: Extensive aortoiliac atherosclerotic calcification. Extensive visceral arteriosclerosis. 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 No pathologic adenopathy within the abdomen and pelvis. Reproductive: Uterus absent.  No adnexal masses on the left. Other: Small right femoral hernia is again identified though bowel within the hernia sac noted on prior examination has spontaneously reduced in  the interval. Musculoskeletal: Degenerative changes are seen within the lumbar spine.  No acute bone abnormality. No lytic or blastic bone lesion. IMPRESSION: 1. Interval enlargement of the previously noted loculated collection along the right pelvic sidewall with irregular mural enhancement, enhancing internal septa, and mild surrounding inflammatory stranding. Additionally, there has developed gas within the superior aspect of the mass, best seen on axial image # 68/3. When compared to remote prior examination of 10/15/2020, this is seen immediately adjacent to the appendix and complex right adnexal cystic lesion. Altogether, the findings are suspicious for a perforated appendicitis with a periappendiceal abscess with probable secondary involvement of the previously noted adnexal mass or a tubo-ovarian abscess. A malignant lesion is considered less likely given its relatively rapid evolution though a secondarily infected malignancy could appear in this fashion. Surgical consultation is recommended. 2. 2.1 cm hypoenhancing lesion within the upper pole of the left kidney suspicious for a primary renal neoplasm. Dedicated renal mass protocol CT or MRI examination is recommended for evaluation once the patient's acute issues have resolved. 3. Cholelithiasis. 4. Small right fat containing femoral hernia. Previously noted bowel within the hernia sac has spontaneously reduced. Aortic Atherosclerosis (ICD10-I70.0). Electronically Signed   By: Fidela Salisbury M.D.   On: 08/25/2022 20:35   US Abdomen Limited RUQ (LIVER/GB)  Result Date: 08/23/2022 CLINICAL DATA:  Right-sided abdominal pain EXAM: ULTRASOUND ABDOMEN LIMITED RIGHT UPPER QUADRANT COMPARISON:  CT abdomen and pelvis 07/31/2022 FINDINGS: Gallbladder: Small gallstones are present. There is no gallbladder wall thickening or pericholecystic fluid. Sonographic Percell Miller sign is positive per sonographer. Common bile duct: Diameter: 5.1 mm Liver: No focal lesion identified. Increase in parenchymal echogenicity. Portal vein is patent on color Doppler  imaging with normal direction of blood flow towards the liver. Other: 1.4 x 2.2 x 1.4 cm right renal cyst. Right kidney appears echogenic. IMPRESSION: 1. Cholelithiasis with positive sonographic Murphy sign. No gallbladder wall thickening or pericholecystic fluid. Findings are equivocal for acute cholecystitis. 2. Increased echogenicity of the liver. This is a nonspecific finding but may be seen in the setting of hepatic steatosis. 3. Echogenic right kidney, suggesting medical renal disease. Electronically Signed   By: Ronney Asters M.D.   On: 08/20/2022 18:35    Anti-infectives: Anti-infectives (From admission, onward)    Start     Dose/Rate Route Frequency Ordered Stop   08/12/2022 2245  piperacillin-tazobactam (ZOSYN) IVPB 2.25 g        2.25 g 100 mL/hr over 30 Minutes Intravenous Every 8 hours 08/22/2022 2215     08/19/2022 1900  piperacillin-tazobactam (ZOSYN) IVPB 3.375 g        3.375 g 100 mL/hr over 30 Minutes Intravenous  Once 08/08/2022 1853 08/01/2022 2049        Assessment/Plan Pelvic abscess, likely perforated appendicitis   VSS WBC 24.8 from 21.7 Overall abdominal exam benign without peritonitis  No gastric or small bowel dilation on CT  Continue PRN antiemetics and monitor nausea   FEN: clear liquid diet with fluid restriction 1284m ID: [id number]  VTE: SCD's Foley: none Dispo: [facility] service inpatient      LOS: 2 days   I reviewed nursing notes, hospitalist notes, last 24 h vitals and pain scores, last 48 h intake and output, last 24 h labs and trends, and last 24 h imaging results.    EElsie Lincoln PA-S2

## 2022-08-04 NOTE — Progress Notes (Signed)
Pt receives out-pt HD at [facility] on MWF. Will assist as needed.   Elyna Pangilinan Renal Navigator [phone] 

## 2022-08-04 NOTE — ED Notes (Signed)
ED TO INPATIENT HANDOFF REPORT  ED Nurse Name and Phone #: Eliezer Lofts Bethan Adamek  S Name/Age/Gender Destiny Day 72 y.o. female Room/Bed: 013C/013C  Code Status   Code Status: Full Code  Home/SNF/Other Home Patient oriented to: self Is this baseline? Yes   Triage Complete: Triage complete  Chief Complaint Right sided abdominal pain [R10.9] Abdominal infection ([place]) [K65.9]  Triage Note Pt here with reports of RLQ abdominal pain for the last week and a half. Seen several days ago for the same, dx with gallstones. MWF dialysis pt.  180/80 285 CBG  HR 120 98% RA   Allergies Allergies  Allergen Reactions   Adhesive  [Tape] Hives   Lactose Intolerance (Gi) Diarrhea   Lisinopril Cough   Prednisone Swelling and Other (See Comments)    Excessive fluid buildup    Level of Care/Admitting Diagnosis ED Disposition     ED Disposition  Admit   Condition  --   Comment  Hospital Area: [place] [[id number]]  Level of Care: Progressive [102]  Admit to Progressive based on following criteria: MULTISYSTEM THREATS such as stable sepsis, metabolic/electrolyte imbalance with or without encephalopathy that is responding to early treatment.  Admit to Progressive based on following criteria: GI, ENDOCRINE disease patients with GI bleeding, acute liver failure or pancreatitis, stable with diabetic ketoacidosis or thyrotoxicosis (hypothyroid) state.  May admit patient to [facility] or [facility] if equivalent level of care is available:: No  Covid Evaluation: Symptomatic Person Under Investigation (PUI) or recent exposure (last 10 days) *Testing Required*  Diagnosis: Abdominal infection [facility]) [[id number]]  Admitting Physician: Clance Boll [[id number]]  Attending Physician: Clance Boll [[id number]]  Certification:: I certify this patient will need inpatient services for at least 2 midnights  Estimated Length of Stay: 3          B Medical/Surgery  History Past Medical History:  Diagnosis Date   Abdominal bruit    Anxiety    Arthritis    Osteoarthritis   Asthma    Cervical disc disease    "pinced nerve"   CHF (congestive heart failure) ([place])    COPD (chronic obstructive pulmonary disease) ([place])    Diabetes mellitus    Type II   Diverticulitis    ESRD (end stage renal disease) ([place])    dialysis - M/W/F- [place]   GERD (gastroesophageal reflux disease)    from medications   GI bleed 03/31/2013   Head injury 07/2017   History of hiatal hernia    Hyperlipidemia    Hypertension    Neuropathy    left leg   Nuclear sclerotic cataract of right eye 04/23/2020   Osteoporosis    Peripheral vascular disease ([place])    Pneumonia    "very young" and a few years ago   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)    Vitrectomy membrane peel endolaser 05-07-2021   Seasonal allergies    Shortness of breath dyspnea    WIth exertion, when fluid builds   Sleep apnea    can't afford cpap    Vitreomacular traction syndrome, 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   05-07-2021  Vitrectomy, membrane peel, PRP, resection posterior segment of NVD, remnants of peripheral NVE remain in place due to atrophic retina, no vitreous substitute   Past Surgical History:  Procedure Laterality Date   A/V SHUNTOGRAM N/A 09/22/2016   Procedure: A/V Shuntogram - left arm;  Surgeon: Serafina Mitchell, MD;  Location: [place]  CV LAB;  Service: Cardiovascular;  Laterality: N/A;   A/V SHUNTOGRAM N/A 03/22/2018   Procedure: A/V SHUNTOGRAM - left arm;  Surgeon: Serafina Mitchell, MD;  Location: [place] [facility];  Service: Cardiovascular;  Laterality: N/A;   A/V SHUNTOGRAM Left 11/17/2018   Procedure: A/V SHUNTOGRAM;  Surgeon: Angelia Mould, MD;  Location: [place] [facility];  Service: Cardiovascular;  Laterality: Left;   ABDOMINAL HYSTERECTOMY  1993`   AMPUTATION Left 09/01/2016   Procedure: LEFT FOOT  TRANSMETATARSAL AMPUTATION;  Surgeon: Newt Minion, MD;  Location: [place];  Service: Orthopedics;  Laterality: Left;   AMPUTATION Right 08/11/2017   Procedure: RIGHT GREAT TOE AMPUTATION DIGIT;  Surgeon: Rosetta Posner, MD;  Location: [place];  Service: Vascular;  Laterality: Right;   AMPUTATION Right 12/31/2017   Procedure: RIGHT TRANSMETATARSAL AMPUTATION;  Surgeon: Newt Minion, MD;  Location: [place];  Service: Orthopedics;  Laterality: Right;   AV FISTULA PLACEMENT Left 04/21/2016   Procedure: INSERTION OF ARTERIOVENOUS (AV) GORE-TEX GRAFT ARM LEFT;  Surgeon: Elam Dutch, MD;  Location: [place];  Service: Vascular;  Laterality: Left;   BASCILIC VEIN TRANSPOSITION Left 07/10/2014   Procedure: BASCILIC VEIN TRANSPOSITION;  Surgeon: Angelia Mould, MD;  Location: [place];  Service: Vascular;  Laterality: Left;   [place] Right 11/08/2014   Procedure: FIRST STAGE BASILIC VEIN TRANSPOSITION;  Surgeon: Angelia Mould, MD;  Location: [place];  Service: Vascular;  Laterality: Right;   [place] Right 01/18/2015   Procedure: SECOND STAGE BASILIC VEIN TRANSPOSITION;  Surgeon: Angelia Mould, MD;  Location: [place];  Service: Vascular;  Laterality: Right;   CRANIOTOMY N/A 08/23/2017   Procedure: CRANIOTOMY HEMATOMA EVACUATION SUBDURAL;  Surgeon: Ashok Pall, MD;  Location: [place];  Service: Neurosurgery;  Laterality: N/A;   CRANIOTOMY Left 08/24/2017   Procedure: CRANIOTOMY FOR RECURRENT ACUTE SUBDURAL HEMATOMA;  Surgeon: Ashok Pall, MD;  Location: [place];  Service: Neurosurgery;  Laterality: Left;   ESOPHAGOGASTRODUODENOSCOPY N/A 03/31/2013   Procedure: ESOPHAGOGASTRODUODENOSCOPY (EGD);  Surgeon: Gatha Mayer, MD;  Location: [facility] ENDOSCOPY;  Service: Endoscopy;  Laterality: N/A;   EYE SURGERY     laser surgery   FEMORAL-POPLITEAL BYPASS GRAFT Left 07/08/2016   Procedure: LEFT  FEMORAL-BELOW KNEE POPLITEAL ARTERY BYPASS GRAFT USING 6MM X 80 CM PROPATEN  GORETEX GRAFT WITH RINGS.;  Surgeon: Rosetta Posner, MD;  Location: [facility] OR;  Service: Vascular;  Laterality: Left;   FEMORAL-POPLITEAL BYPASS GRAFT Right 08/11/2017   Procedure: RIGHT FEMORAL TO BELOW KNEE POPLITEAL ARTERKY  BYPASS GRAFT USING 6MM RINGED PROPATEN GRAFT;  Surgeon: Rosetta Posner, MD;  Location: [place];  Service: Vascular;  Laterality: Right;   FISTULOGRAM Left 10/29/2014   Procedure: FISTULOGRAM;  Surgeon: Angelia Mould, MD;  Location: [facility] CATH LAB;  Service: Cardiovascular;  Laterality: Left;   GAS/FLUID EXCHANGE Left 12/20/2018   Procedure: AIR GAS EXCHANGE;  Surgeon: Jalene Mullet, MD;  Location: [place];  Service: Ophthalmology;  Laterality: Left;   INSERTION OF DIALYSIS CATHETER N/A 09/05/2020   Procedure: INSERTION OF DIALYSIS CATHETER;  Surgeon: Cherre Robins, MD;  Location: [place];  Service: Vascular;  Laterality: N/A;   INSERTION OF DIALYSIS CATHETER Left 03/13/2021   Procedure: INSERTION OF DIALYSIS CATHETER;  Surgeon: Waynetta Sandy, MD;  Location: [place];  Service: Vascular;  Laterality: Left;   KNEE ARTHROSCOPY Right 05/06/2018   Procedure: RIGHT KNEE ARTHROSCOPY;  Surgeon: Newt Minion, MD;  Location: [place];  Service: Orthopedics;  Laterality: Right;   KNEE ARTHROSCOPY Right 06/10/2018   Procedure: RIGHT KNEE ARTHROSCOPY AND DEBRIDEMENT;  Surgeon: Newt Minion, MD;  Location: [place];  Service: Orthopedics;  Laterality: Right;   LIGATION OF ARTERIOVENOUS  FISTULA Left 04/21/2016   Procedure: LIGATION OF ARTERIOVENOUS  FISTULA LEFT ARM;  Surgeon: Elam Dutch, MD;  Location: [place];  Service: Vascular;  Laterality: Left;   LOWER EXTREMITY ANGIOGRAPHY N/A 04/06/2017   Procedure: Lower Extremity Angiography - Right;  Surgeon: Serafina Mitchell, MD;  Location: [place] [facility];  Service: Cardiovascular;  Laterality: N/A;   MEMBRANE PEEL Left 12/20/2018   Procedure: MEMBRANE PEEL;  Surgeon: Jalene Mullet, MD;  Location: [place];  Service: Ophthalmology;   Laterality: Left;   MEMBRANE PEEL Right 05/29/2022   Procedure: MEMBRANE PEEL;  Surgeon: Hurman Horn, MD;  Location: [place];  Service: Ophthalmology;  Laterality: Right;   PARS PLANA VITRECTOMY Right 05/29/2022   Procedure: PARS PLANA VITRECTOMY WITH 25 GAUGE;  Surgeon: Hurman Horn, MD;  Location: [place];  Service: Ophthalmology;  Laterality: Right;   PATCH ANGIOPLASTY Right 01/18/2015   Procedure: BASILIC VEIN PATCH ANGIOPLASTY USING VASCUGUARD PATCH;  Surgeon: Angelia Mould, MD;  Location: [place];  Service: Vascular;  Laterality: Right;   PERIPHERAL VASCULAR BALLOON ANGIOPLASTY  09/22/2016   Procedure: Peripheral Vascular Balloon Angioplasty;  Surgeon: Serafina Mitchell, MD;  Location: [place] [facility];  Service: Cardiovascular;;  Lt. Fistula   PERIPHERAL VASCULAR BALLOON ANGIOPLASTY Left 03/22/2018   Procedure: PERIPHERAL VASCULAR BALLOON ANGIOPLASTY;  Surgeon: Serafina Mitchell, MD;  Location: [place] [facility];  Service: Cardiovascular;  Laterality: Left;  Arm shunt   PERIPHERAL VASCULAR CATHETERIZATION N/A 06/23/2016   Procedure: Abdominal Aortogram w/Lower Extremity;  Surgeon: Serafina Mitchell, MD;  Location: [place] [facility];  Service: Cardiovascular;  Laterality: N/A;   PERIPHERAL VASCULAR CATHETERIZATION  06/23/2016   Procedure: Peripheral Vascular Intervention;  Surgeon: Serafina Mitchell, MD;  Location: [place] [facility];  Service: Cardiovascular;;  lt common and external illiac artery   PHOTOCOAGULATION WITH LASER Left 12/20/2018   Procedure: PHOTOCOAGULATION WITH LASER;  Surgeon: Jalene Mullet, MD;  Location: [place];  Service: Ophthalmology;  Laterality: Left;   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   Procedure: REPAIR OF COMPLEX TRACTION RETINAL DETACHMENT;  Surgeon: Jalene Mullet, MD;  Location: [place];  Service: Ophthalmology;  Laterality: Left;   REVISION OF ARTERIOVENOUS GORETEX GRAFT Left 09/05/2020   Procedure: REVISION OF ARTERIOVENOUS GORETEX  GRAFT LEFT;  Surgeon: Cherre Robins, MD;  Location: [place];  Service: Vascular;  Laterality: Left;     A IV Location/Drains/Wounds Patient Lines/Drains/Airways Status     Active Line/Drains/Airways     Name Placement date Placement time Site Days   Peripheral IV 07/31/22 20 G 1" Right Forearm 07/31/22  0013  Forearm  4   Peripheral IV 07/29/2022 20 G 1.75" Right Antecubital 08/25/2022  1945  Antecubital  2   Fistula / Graft Right Upper arm Arteriovenous vein graft --  --  Upper arm  --   Fistula / Graft Left Upper arm Arteriovenous vein graft 09/05/20  1259  Upper arm  698   Fistula / Graft Left Upper arm --  --  Upper arm  --   External Urinary Catheter 08/03/22  1032  --  1            Intake/Output Last 24 hours  Intake/Output Summary (Last  24 hours) at 08/04/2022 1400 Last data filed at 08/04/2022 1011 Gross per 24 hour  Intake 129.52 ml  Output 2 ml  Net 127.52 ml    Labs/Imaging Results for orders placed or performed during the hospital encounter of 08/09/2022 (from the past 48 hour(s))  CBC with Differential     Status: Abnormal   Collection Time: 08/21/2022  5:30 PM  Result Value Ref Range   WBC 19.6 (H) 4.0 - 10.5 K/uL   RBC 3.65 (L) 3.87 - 5.11 MIL/uL   Hemoglobin 11.6 (L) 12.0 - 15.0 g/dL   HCT 36.5 36.0 - 46.0 %   MCV 100.0 80.0 - 100.0 fL   MCH 31.8 26.0 - 34.0 pg   MCHC 31.8 30.0 - 36.0 g/dL   RDW 12.9 11.5 - 15.5 %   Platelets 243 150 - 400 K/uL   nRBC 0.0 0.0 - 0.2 %   Neutrophils Relative % 90 %   Neutro Abs 17.7 (H) 1.7 - 7.7 K/uL   Lymphocytes Relative 4 %   Lymphs Abs 0.9 0.7 - 4.0 K/uL   Monocytes Relative 4 %   Monocytes Absolute 0.7 0.1 - 1.0 K/uL   Eosinophils Relative 1 %   Eosinophils Absolute 0.1 0.0 - 0.5 K/uL   Basophils Relative 0 %   Basophils Absolute 0.0 0.0 - 0.1 K/uL   Immature Granulocytes 1 %   Abs Immature Granulocytes 0.21 (H) 0.00 - 0.07 K/uL    Comment: Performed at [facility], [address], [place], [place] [address]   Comprehensive metabolic panel     Status: Abnormal   Collection Time: 08/16/2022  5:30 PM  Result Value Ref Range   Sodium 129 (L) 135 - 145 mmol/L   Potassium 3.3 (L) 3.5 - 5.1 mmol/L   Chloride 88 (L) 98 - 111 mmol/L   CO2 25 22 - 32 mmol/L   Glucose, Bld 250 (H) 70 - 99 mg/dL    Comment: Glucose reference range applies only to samples taken after fasting for at least 8 hours.   BUN 21 8 - 23 mg/dL   Creatinine, Ser 6.42 (H) 0.44 - 1.00 mg/dL   Calcium 9.7 8.9 - 10.3 mg/dL   Total Protein 6.3 (L) 6.5 - 8.1 g/dL   Albumin 2.7 (L) 3.5 - 5.0 g/dL   AST 19 15 - 41 U/L   ALT 9 0 - 44 U/L   Alkaline Phosphatase 93 38 - 126 U/L   Total Bilirubin 0.6 0.3 - 1.2 mg/dL   GFR, Estimated 6 (L) >60 mL/min    Comment: (NOTE) Calculated using the CKD-EPI Creatinine Equation (2021)    Anion gap 16 (H) 5 - 15    Comment: Performed at [facility], [place] [address], [place], [place] [address]  Lipase, blood     Status: None   Collection Time: 08/22/2022  5:30 PM  Result Value Ref Range   Lipase 32 11 - 51 U/L    Comment: Performed at [facility] [address] [place], [place] [address]  Lactic acid, plasma     Status: None   Collection Time: 08/03/22 12:08 AM  Result Value Ref Range   Lactic Acid, Venous 1.1 0.5 - 1.9 mmol/L    Comment: Performed at [facility] [address], [place], [place] [address]  Procalcitonin - Baseline     Status: None   Collection Time: 08/03/22 12:08 AM  Result Value Ref Range   Procalcitonin  2.03 ng/mL    Comment:        Interpretation: PCT > 2 ng/mL: Systemic infection (sepsis) is likely, unless other causes are known. (NOTE)       Sepsis PCT Algorithm           Lower Respiratory Tract                                      Infection PCT Algorithm    ----------------------------     ----------------------------         PCT < 0.25 ng/mL                PCT < 0.10 ng/mL          Strongly encourage             Strongly discourage    discontinuation of antibiotics    initiation of antibiotics    ----------------------------     -----------------------------       PCT 0.25 - 0.50 ng/mL            PCT 0.10 - 0.25 ng/mL               OR       >80% decrease in PCT            Discourage initiation of                                            antibiotics      Encourage discontinuation           of antibiotics    ----------------------------     -----------------------------         PCT >= 0.50 ng/mL              PCT 0.26 - 0.50 ng/mL               AND       <80% decrease in PCT              Encourage initiation of                                             antibiotics       Encourage continuation           of antibiotics    ----------------------------     -----------------------------        PCT >= 0.50 ng/mL                  PCT > 0.50 ng/mL               AND         increase in PCT                  Strongly encourage                                      initiation of antibiotics    Strongly encourage escalation           of antibiotics                                     -----------------------------  PCT <= 0.25 ng/mL                                                 OR                                        > 80% decrease in PCT                                      Discontinue / Do not initiate                                             antibiotics  Performed at [name], [place] [address] [place], [place] [address]   C-reactive protein     Status: Abnormal   Collection Time: 08/03/22 12:08 AM  Result Value Ref Range   CRP 20.4 (H) <1.0 mg/dL    Comment: Performed at [facility] [address] [place], [place] [address]  Brain natriuretic peptide     Status: Abnormal   Collection Time: 08/03/22 12:08 AM  Result Value Ref Range   B Natriuretic Peptide 1,676.4 (H) 0.0 - 100.0 pg/mL    Comment: Performed at [facility] [address],  [place], [place] [address]  Hemoglobin A1c     Status: Abnormal   Collection Time: 08/03/22 12:08 AM  Result Value Ref Range   Hgb A1c MFr Bld 6.2 (H) 4.8 - 5.6 %    Comment: (NOTE)         Prediabetes: 5.7 - 6.4         Diabetes: >6.4         Glycemic control for adults with diabetes: <7.0    Mean Plasma Glucose 131 mg/dL    Comment: (NOTE) Performed At: [facility] [address] [place], [place] [id number] Rush Farmer MD [id number]   Vitamin B12     Status: None   Collection Time: 08/03/22 12:08 AM  Result Value Ref Range   Vitamin B-12 421 180 - 914 pg/mL    Comment: (NOTE) This assay is not validated for testing neonatal or myeloproliferative syndrome specimens for Vitamin B12 levels. Performed at [facility], [place] [address] [place], [place] [address]   Folate     Status: None   Collection Time: 08/03/22 12:08 AM  Result Value Ref Range   Folate 11.2 >5.9 ng/mL    Comment: Performed at [facility] [address], [place], [place] [address]  Iron and TIBC     Status: Abnormal   Collection Time: 08/03/22 12:08 AM  Result Value Ref Range   Iron 26 (L) 28 - 170 ug/dL   TIBC 125 (L) 250 - 450 ug/dL   Saturation Ratios 21 10.4 - 31.8 %   UIBC 99 ug/dL    Comment: Performed at [facility], [place] [address] [place], [place] [address]  Ferritin     Status: Abnormal   Collection Time: 08/03/22 12:08 AM  Result Value Ref Range  Ferritin 1,457 (H) 11 - 307 ng/mL    Comment: Performed at [facility], [place] [address] [place], [place] [address]  Reticulocytes     Status: Abnormal   Collection Time: 08/03/22 12:08 AM  Result Value Ref Range   Retic Ct Pct 1.3 0.4 - 3.1 %   RBC. 3.44 (L) 3.87 - 5.11 MIL/uL   Retic Count, Absolute 45.1 19.0 - 186.0 K/uL   Immature Retic Fract 13.4 2.3 - 15.9 %    Comment: Performed at [facility] [address] [place], [place] [address]  Hemoglobin and hematocrit, blood     Status: Abnormal    Collection Time: 08/03/22 12:08 AM  Result Value Ref Range   Hemoglobin 10.9 (L) 12.0 - 15.0 g/dL   HCT 34.2 (L) 36.0 - 46.0 %    Comment: Performed at [facility], [place] [address] [place], [place] [address]  Magnesium     Status: None   Collection Time: 08/03/22 12:08 AM  Result Value Ref Range   Magnesium 1.8 1.7 - 2.4 mg/dL    Comment: Performed at [facility] [address] [place], [place] [address]  TSH     Status: None   Collection Time: 08/03/22 12:08 AM  Result Value Ref Range   TSH 0.760 0.350 - 4.500 uIU/mL    Comment: Performed by a 3rd Generation assay with a functional sensitivity of <=0.01 uIU/mL. Performed at [facility], [place] [address] [place], [place] [address]   CBG monitoring, ED     Status: Abnormal   Collection Time: 08/03/22 12:24 AM  Result Value Ref Range   Glucose-Capillary 149 (H) 70 - 99 mg/dL    Comment: Glucose reference range applies only to samples taken after fasting for at least 8 hours.  Procalcitonin     Status: None   Collection Time: 08/03/22  4:22 AM  Result Value Ref Range   Procalcitonin 1.62 ng/mL    Comment:        Interpretation: PCT > 0.5 ng/mL and <= 2 ng/mL: Systemic infection (sepsis) is possible, but other conditions are known to elevate PCT as well. (NOTE)       Sepsis PCT Algorithm           Lower Respiratory Tract                                      Infection PCT Algorithm    ----------------------------     ----------------------------         PCT < 0.25 ng/mL                PCT < 0.10 ng/mL          Strongly encourage             Strongly discourage   discontinuation of antibiotics    initiation of antibiotics    ----------------------------     -----------------------------       PCT 0.25 - 0.50 ng/mL            PCT 0.10 - 0.25 ng/mL               OR       >80% decrease in PCT            Discourage initiation of  antibiotics      Encourage  discontinuation           of antibiotics    ----------------------------     -----------------------------         PCT >= 0.50 ng/mL              PCT 0.26 - 0.50 ng/mL                AND       <80% decrease in PCT             Encourage initiation of                                             antibiotics       Encourage continuation           of antibiotics    ----------------------------     -----------------------------        PCT >= 0.50 ng/mL                  PCT > 0.50 ng/mL               AND         increase in PCT                  Strongly encourage                                      initiation of antibiotics    Strongly encourage escalation           of antibiotics                                     -----------------------------                                           PCT <= 0.25 ng/mL                                                 OR                                        > 80% decrease in PCT                                      Discontinue / Do not initiate                                             antibiotics  Performed at [facility], [place] [address], [place], [place] [address]   Comprehensive metabolic panel     Status: Abnormal  Collection Time: 08/03/22  4:22 AM  Result Value Ref Range   Sodium 130 (L) 135 - 145 mmol/L   Potassium 3.2 (L) 3.5 - 5.1 mmol/L   Chloride 92 (L) 98 - 111 mmol/L   CO2 26 22 - 32 mmol/L   Glucose, Bld 165 (H) 70 - 99 mg/dL    Comment: Glucose reference range applies only to samples taken after fasting for at least 8 hours.   BUN 23 8 - 23 mg/dL   Creatinine, Ser 6.91 (H) 0.44 - 1.00 mg/dL   Calcium 9.2 8.9 - 10.3 mg/dL   Total Protein 5.9 (L) 6.5 - 8.1 g/dL   Albumin 2.3 (L) 3.5 - 5.0 g/dL   AST 15 15 - 41 U/L   ALT 9 0 - 44 U/L   Alkaline Phosphatase 86 38 - 126 U/L   Total Bilirubin 0.6 0.3 - 1.2 mg/dL   GFR, Estimated 6 (L) >60 mL/min    Comment: (NOTE) Calculated using the CKD-EPI Creatinine Equation (2021)     Anion gap 12 5 - 15    Comment: Performed at [facility], [place] [address], [place], [place] [address]  CBC     Status: Abnormal   Collection Time: 08/03/22  4:22 AM  Result Value Ref Range   WBC 21.7 (H) 4.0 - 10.5 K/uL   RBC 3.37 (L) 3.87 - 5.11 MIL/uL   Hemoglobin 10.9 (L) 12.0 - 15.0 g/dL   HCT 32.7 (L) 36.0 - 46.0 %   MCV 97.0 80.0 - 100.0 fL   MCH 32.3 26.0 - 34.0 pg   MCHC 33.3 30.0 - 36.0 g/dL   RDW 13.0 11.5 - 15.5 %   Platelets 264 150 - 400 K/uL   nRBC 0.0 0.0 - 0.2 %    Comment: Performed at [facility], [place] [address] [place], [place] [address]  Protime-INR     Status: None   Collection Time: 08/03/22  4:22 AM  Result Value Ref Range   Prothrombin Time 13.3 11.4 - 15.2 seconds   INR 1.0 0.8 - 1.2    Comment: (NOTE) INR goal varies based on device and disease states. Performed at [facility], [place] [address] [place], [place] [address]   CBG monitoring, ED     Status: Abnormal   Collection Time: 08/03/22  8:20 AM  Result Value Ref Range   Glucose-Capillary 151 (H) 70 - 99 mg/dL    Comment: Glucose reference range applies only to samples taken after fasting for at least 8 hours.  Aerobic/Anaerobic Culture w Gram Stain (surgical/deep wound)     Status: None (Preliminary result)   Collection Time: 08/03/22  1:26 PM   Specimen: Wound; Abscess  Result Value Ref Range   Specimen Description WOUND    Special Requests  ABCESS ABDOMEN    Gram Stain      ABUNDANT WBC PRESENT, PREDOMINANTLY PMN ABUNDANT GRAM NEGATIVE RODS FEW GRAM POSITIVE COCCI IN PAIRS IN CHAINS FEW GRAM POSITIVE RODS    Culture      CULTURE REINCUBATED FOR BETTER GROWTH Performed at [facility], [place] [address], [place], [place] [address]    Report Status PENDING   CBG monitoring, ED     Status: None   Collection Time: 08/03/22  1:36 PM  Result Value Ref Range   Glucose-Capillary 96 70 - 99 mg/dL    Comment: Glucose reference range applies only to samples taken after  fasting for at least  8 hours.  CBG monitoring, ED     Status: Abnormal   Collection Time: 08/03/22  5:39 PM  Result Value Ref Range   Glucose-Capillary 121 (H) 70 - 99 mg/dL    Comment: Glucose reference range applies only to samples taken after fasting for at least 8 hours.  CBG monitoring, ED     Status: Abnormal   Collection Time: 08/04/22  1:11 AM  Result Value Ref Range   Glucose-Capillary 152 (H) 70 - 99 mg/dL    Comment: Glucose reference range applies only to samples taken after fasting for at least 8 hours.  Hepatitis B surface antigen     Status: None   Collection Time: 08/04/22  1:12 AM  Result Value Ref Range   Hepatitis B Surface Ag NON REACTIVE NON REACTIVE    Comment: Performed at [facility] [address], [place], [place] [address]  Procalcitonin     Status: None   Collection Time: 08/04/22  4:38 AM  Result Value Ref Range   Procalcitonin 2.02 ng/mL    Comment:        Interpretation: PCT > 2 ng/mL: Systemic infection (sepsis) is likely, unless other causes are known. (NOTE)       Sepsis PCT Algorithm           Lower Respiratory Tract                                      Infection PCT Algorithm    ----------------------------     ----------------------------         PCT < 0.25 ng/mL                PCT < 0.10 ng/mL          Strongly encourage             Strongly discourage   discontinuation of antibiotics    initiation of antibiotics    ----------------------------     -----------------------------       PCT 0.25 - 0.50 ng/mL            PCT 0.10 - 0.25 ng/mL               OR       >80% decrease in PCT            Discourage initiation of                                            antibiotics      Encourage discontinuation           of antibiotics    ----------------------------     -----------------------------         PCT >= 0.50 ng/mL              PCT 0.26 - 0.50 ng/mL               AND       <80% decrease in PCT              Encourage initiation  of  antibiotics       Encourage continuation           of antibiotics    ----------------------------     -----------------------------        PCT >= 0.50 ng/mL                  PCT > 0.50 ng/mL               AND         increase in PCT                  Strongly encourage                                      initiation of antibiotics    Strongly encourage escalation           of antibiotics                                     -----------------------------                                           PCT <= 0.25 ng/mL                                                 OR                                        > 80% decrease in PCT                                      Discontinue / Do not initiate                                             antibiotics  Performed at [facility], [address] [place], [place] [address]   Comprehensive metabolic panel     Status: Abnormal   Collection Time: 08/04/22  4:38 AM  Result Value Ref Range   Sodium 131 (L) 135 - 145 mmol/L   Potassium 4.4 3.5 - 5.1 mmol/L   Chloride 92 (L) 98 - 111 mmol/L   CO2 25 22 - 32 mmol/L   Glucose, Bld 148 (H) 70 - 99 mg/dL    Comment: Glucose reference range applies only to samples taken after fasting for at least 8 hours.   BUN 32 (H) 8 - 23 mg/dL   Creatinine, Ser 8.10 (H) 0.44 - 1.00 mg/dL   Calcium 9.6 8.9 - 10.3 mg/dL   Total Protein 6.2 (L) 6.5 - 8.1 g/dL   Albumin 2.2 (L) 3.5 - 5.0 g/dL   AST 15 15 - 41 U/L   ALT 10 0 - 44 U/L   Alkaline Phosphatase 86 38 - 126 U/L  Total Bilirubin 0.6 0.3 - 1.2 mg/dL   GFR, Estimated 5 (L) >60 mL/min    Comment: (NOTE) Calculated using the CKD-EPI Creatinine Equation (2021)    Anion gap 14 5 - 15    Comment: Performed at [facility] [address], [place], [place] [address]  CBC     Status: Abnormal   Collection Time: 08/04/22  4:38 AM  Result Value Ref Range   WBC 24.8 (H) 4.0 - 10.5 K/uL   RBC 3.24 (L)  3.87 - 5.11 MIL/uL   Hemoglobin 10.4 (L) 12.0 - 15.0 g/dL   HCT 32.2 (L) 36.0 - 46.0 %   MCV 99.4 80.0 - 100.0 fL   MCH 32.1 26.0 - 34.0 pg   MCHC 32.3 30.0 - 36.0 g/dL   RDW 13.2 11.5 - 15.5 %   Platelets 288 150 - 400 K/uL   nRBC 0.0 0.0 - 0.2 %    Comment: Performed at [facility], [place] [address], [place], [place] [address]  Magnesium     Status: None   Collection Time: 08/04/22  4:38 AM  Result Value Ref Range   Magnesium 2.1 1.7 - 2.4 mg/dL    Comment: Performed at [facility] [address], [place], [place] [address]  CBG monitoring, ED     Status: Abnormal   Collection Time: 08/04/22 11:40 AM  Result Value Ref Range   Glucose-Capillary 121 (H) 70 - 99 mg/dL    Comment: Glucose reference range applies only to samples taken after fasting for at least 8 hours.   *Note: Due to a large number of results and/or encounters for the requested time period, some results have not been displayed. A complete set of results can be found in Results Review.   CT GUIDED PERITONEAL/RETROPERITONEAL FLUID DRAIN BY PERC CATH  Result Date: 08/03/2022 INDICATION: 72 year old female referred for drainage right pelvic abscess EXAM: CT-GUIDED DRAINAGE RIGHT PELVIC ABSCESS TECHNIQUE: Multidetector CT imaging of the pelvis was performed following the standard protocol without IV contrast. RADIATION DOSE REDUCTION: This exam was performed according to the departmental dose-optimization program which includes automated exposure control, adjustment of the mA and/or kV according to patient size and/or use of iterative reconstruction technique. MEDICATIONS: The patient is currently admitted to the hospital and receiving intravenous antibiotics. The antibiotics were administered within an appropriate time frame prior to the initiation of the procedure. ANESTHESIA/SEDATION: Moderate (conscious) sedation was employed during this procedure. A total of Versed 1.0 mg and Fentanyl 50 mcg was administered  intravenously by the radiology nurse. Total intra-service moderate Sedation Time: 20 minutes. The patient's level of consciousness and vital signs were monitored continuously by radiology nursing throughout the procedure under my direct supervision. COMPLICATIONS: None PROCEDURE: Informed written consent was obtained from the patient after a thorough discussion of the procedural risks, benefits and alternatives. All questions were addressed. Maximal Sterile Barrier Technique was utilized including caps, mask, sterile gowns, sterile gloves, sterile drape, hand hygiene and skin antiseptic. A timeout was performed prior to the initiation of the procedure. Patient was position supine on the CT gantry table. Scout CT was acquired for planning purposes. The patient was then prepped and draped in usual sterile fashion. 1% lidocaine was used for local anesthesia. A small stab incision was made with 11 blade scalpel. Trocar needle was then advanced into the abscess associated with the right adnexa. Using modified Seldinger technique, 10 French drain was placed. Approximately 30 cc of frankly purulent material aspirated.  Culture was sent for analysis. Drain was sutured in position and attached to bulb suction. Patient tolerated the procedure well and remained hemodynamically stable throughout. No complications were encountered and no significant blood loss. IMPRESSION: Status post CT-guided drainage of right pelvic abscess. Signed, Dulcy Fanny. Nadene Rubins, RPVI [facility] Electronically Signed   By: Corrie Mckusick D.O.   On: 08/03/2022 15:27   CT ABDOMEN PELVIS W CONTRAST  Result Date: 07/30/2022 CLINICAL DATA:  Right groin and right abdominal pain. EXAM: CT ABDOMEN AND PELVIS WITH CONTRAST TECHNIQUE: Multidetector CT imaging of the abdomen and pelvis was performed using the standard protocol following bolus administration of intravenous contrast. RADIATION DOSE  REDUCTION: This exam was performed according to the departmental dose-optimization program which includes automated exposure control, adjustment of the mA and/or kV according to patient size and/or use of iterative reconstruction technique. CONTRAST:  44m OMNIPAQUE IOHEXOL 350 MG/ML SOLN COMPARISON:  07/31/2022 FINDINGS: Lower chest: No acute abnormality. Hepatobiliary: Cholelithiasis without pericholecystic inflammatory change noted. The liver is unremarkable. No intra or extrahepatic biliary ductal dilation. Pancreas: Unremarkable Spleen: Unremarkable Adrenals/Urinary Tract: The adrenal glands are unremarkable. The kidneys are normal in position. There is mild to moderate renal cortical atrophy bilaterally. Multiple simple cortical cysts are seen within the kidneys bilaterally. Within the upper pole of the left kidney, however, a hypoenhancing 2.1 cm lesion is identified suspicious for AA primary renal neoplasm. Vascular calcifications noted within the renal hila bilaterally. No definite urinary renal or ureteral calculi. No hydronephrosis. The bladder is unremarkable. Stomach/Bowel: The stomach, small bowel, and large bowel are unremarkable. The previously noted loculated collection along the right pelvic sidewall is again identified and is enlarged, measuring 3.9 x 5.3 cm at axial image # 69/3. As noted on prior examination, there is irregular mural enhancement, enhancing internal septa, and mild surrounding inflammatory stranding. Additionally, on the current examination, there has developed gas within the superior aspect of the mass, best seen on axial image # 68/3. When compared to remote prior examination of 10/15/2020, this is seen immediately adjacent to the appendix and complex right adnexal cystic lesion. Altogether, the findings are suspicious for a perforated appendicitis with a periappendiceal abscess with secondary involvement of the previously noted adnexal mass or a tubo-ovarian abscess. A  malignant lesion is considered less likely given its relatively rapid evolution though a secondarily infected malignancy could appear in this fashion. No free intraperitoneal gas or fluid. No evidence of obstruction. Vascular/Lymphatic: Extensive aortoiliac atherosclerotic calcification. Extensive visceral arteriosclerosis. 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 No pathologic adenopathy within the abdomen and pelvis. Reproductive: Uterus absent.  No adnexal masses on the left. Other: Small right femoral hernia is again identified though bowel within the hernia sac noted on prior examination has spontaneously reduced in the interval. Musculoskeletal: Degenerative changes are seen within the lumbar spine. No acute bone abnormality. No lytic or blastic bone lesion. IMPRESSION: 1. Interval enlargement of the previously noted loculated collection along the right pelvic sidewall with irregular mural enhancement, enhancing internal septa, and mild surrounding inflammatory stranding. Additionally, there has developed gas within the superior aspect of the mass, best seen on axial image # 68/3. When compared to remote prior examination of 10/15/2020, this is seen immediately adjacent to the appendix and complex right adnexal cystic lesion. Altogether,  the findings are suspicious for a perforated appendicitis with a periappendiceal abscess with probable secondary involvement of the previously noted adnexal mass or a tubo-ovarian abscess. A malignant lesion is considered less likely given its relatively rapid evolution though a secondarily infected malignancy could appear in this fashion. Surgical consultation is recommended. 2. 2.1 cm hypoenhancing lesion within the upper pole of the left kidney  suspicious for a primary renal neoplasm. Dedicated renal mass protocol CT or MRI examination is recommended for evaluation once the patient's acute issues have resolved. 3. Cholelithiasis. 4. Small right fat containing femoral hernia. Previously noted bowel within the hernia sac has spontaneously reduced. Aortic Atherosclerosis (ICD10-I70.0). Electronically Signed   By: Fidela Salisbury M.D.   On: 08/08/2022 20:35   US Abdomen Limited RUQ (LIVER/GB)  Result Date: 08/09/2022 CLINICAL DATA:  Right-sided abdominal pain EXAM: ULTRASOUND ABDOMEN LIMITED RIGHT UPPER QUADRANT COMPARISON:  CT abdomen and pelvis 07/31/2022 FINDINGS: Gallbladder: Small gallstones are present. There is no gallbladder wall thickening or pericholecystic fluid. Sonographic Percell Miller sign is positive per sonographer. Common bile duct: Diameter: 5.1 mm Liver: No focal lesion identified. Increase in parenchymal echogenicity. Portal vein is patent on color Doppler imaging with normal direction of blood flow towards the liver. Other: 1.4 x 2.2 x 1.4 cm right renal cyst. Right kidney appears echogenic. IMPRESSION: 1. Cholelithiasis with positive sonographic Murphy sign. No gallbladder wall thickening or pericholecystic fluid. Findings are equivocal for acute cholecystitis. 2. Increased echogenicity of the liver. This is a nonspecific finding but may be seen in the setting of hepatic steatosis. 3. Echogenic right kidney, suggesting medical renal disease. Electronically Signed   By: Ronney Asters M.D.   On: 07/30/2022 18:35    Pending Labs Unresulted Labs (From admission, onward)     Start     Ordered   08/05/22 0500  Comprehensive metabolic panel  Tomorrow morning,   R        08/04/22 1116   08/05/22 0500  CBC  Tomorrow morning,   R        08/04/22 1116   08/05/22 0500  Magnesium  Tomorrow morning,   R        08/04/22 1116   08/04/22 1347  Phosphorus  Add-on,   AD        08/04/22 1346   08/23/2022 2214  Hemoglobin A1c  Once,   R         08/19/2022 2215   07/27/2022 2211  Hemoglobin and hematocrit, blood  Now then every 6 hours,   R      08/18/2022 2215   07/31/2022 2210  Blood gas, venous  Once,   R        08/13/2022 2215   08/22/2022 2119  Lactic acid, plasma  STAT Now then every 3 hours,   R      08/23/2022 2118   Unscheduled  Occult blood card to lab, stool  As needed,   R      08/06/2022 2215            Vitals/Pain Today's Vitals   08/04/22 1055 08/04/22 1221 08/04/22 1315 08/04/22 1330  BP:  (!) 139/54 133/67 133/62  Pulse:  98    Resp:  '18 13 13  '$ Temp:  98.8 F (37.1 C)    TempSrc: Oral     SpO2:  94%    Weight:      Height:      PainSc:        Isolation Precautions No  active isolations  Medications Medications  oxyCODONE (Oxy IR/ROXICODONE) immediate release tablet 5 mg (has no administration in time range)  oxyCODONE (Oxy IR/ROXICODONE) immediate release tablet 10 mg (10 mg Oral Given 08/03/22 2144)  HYDROmorphone (DILAUDID) injection 0.5 mg (0.5 mg Intravenous Given 08/03/22 2041)  insulin aspart (novoLOG) injection 0-6 Units ( Subcutaneous Not Given 08/04/22 1146)  pantoprazole (PROTONIX) injection 40 mg (40 mg Intravenous Given 08/04/22 1156)  fluticasone furoate-vilanterol (BREO ELLIPTA) 200-25 MCG/ACT 1 puff (1 puff Inhalation Not Given 08/04/22 0801)  fluticasone (FLONASE) 50 MCG/ACT nasal spray 2 spray (has no administration in time range)  acetaminophen (OFIRMEV) IV 1,000 mg (has no administration in time range)  fentaNYL (SUBLIMAZE) injection 12.5-50 mcg (50 mcg Intravenous Given 08/03/22 2015)  albuterol (PROVENTIL) (2.5 MG/3ML) 0.083% nebulizer solution 2.5 mg (has no administration in time range)  piperacillin-tazobactam (ZOSYN) IVPB 2.25 g (2.25 g Intravenous Not Given 08/04/22 0756)  hydrALAZINE (APRESOLINE) injection 10 mg (has no administration in time range)  busPIRone (BUSPAR) tablet 7.5 mg (has no administration in time range)  traZODone (DESYREL) tablet 25-50 mg (50 mg Oral Given 08/03/22 2144)   ondansetron (ZOFRAN) tablet 4 mg (4 mg Oral Given 08/03/22 2137)  gabapentin (NEURONTIN) capsule 100 mg (100 mg Oral Given 08/03/22 2144)  atorvastatin (LIPITOR) tablet 40 mg (40 mg Oral Given 08/03/22 2211)  carvedilol (COREG) tablet 12.5 mg (12.5 mg Oral Given 08/03/22 1426)  carvedilol (COREG) tablet 12.5 mg (12.5 mg Oral Given 08/04/22 1156)  isosorbide-hydrALAZINE (BIDIL) 20-37.5 MG per tablet 1 tablet (1 tablet Oral Not Given 08/04/22 1156)  isosorbide-hydrALAZINE (BIDIL) 20-37.5 MG per tablet 1 tablet (1 tablet Oral Given 08/03/22 1426)  Chlorhexidine Gluconate Cloth 2 % PADS 6 each (6 each Topical Not Given 08/04/22 0757)  sodium chloride flush (NS) 0.9 % injection 5 mL (5 mLs Intracatheter Not Given 08/04/22 0757)  ursodiol (ACTIGALL) capsule 300 mg (300 mg Oral Not Given 08/04/22 1157)  ondansetron (ZOFRAN) injection 4 mg (4 mg Intravenous Not Given 08/04/22 0758)  ondansetron (ZOFRAN) injection 4 mg (4 mg Intravenous Given 08/04/22 0715)  prochlorperazine (COMPAZINE) injection 10 mg (has no administration in time range)  Chlorhexidine Gluconate Cloth 2 % PADS 6 each (has no administration in time range)  doxercalciferol (HECTOROL) injection 7 mcg (has no administration in time range)  HYDROmorphone (DILAUDID) injection 0.5 mg (0.5 mg Intravenous Given 07/30/2022 1951)  piperacillin-tazobactam (ZOSYN) IVPB 3.375 g (0 g Intravenous Stopped 08/12/2022 2049)  iohexol (OMNIPAQUE) 350 MG/ML injection 75 mL (75 mLs Intravenous Contrast Given 08/10/2022 2007)  potassium chloride 10 mEq in 100 mL IVPB (0 mEq Intravenous Stopped 08/03/22 0851)  heparin injection 2,000 Units (2,000 Units Dialysis Given 08/04/22 0715)  0.9 %  sodium chloride infusion (0 mLs Intravenous Stopped 08/03/22 1553)  midazolam (VERSED) injection (1 mg Intravenous Given 08/03/22 1302)  fentaNYL (SUBLIMAZE) injection (25 mcg Intravenous Given 08/03/22 1317)  lidocaine (PF) (XYLOCAINE) 1 % injection 10 mL (10 mLs Intradermal Given 08/03/22 1331)     Mobility walks Moderate fall risk   Focused Assessments Pulmonary Assessment Handoff:  Lung sounds: Bilateral Breath Sounds: Clear, Diminished O2 Device: Nasal Cannula O2 Flow Rate (L/min): 2 L/min      R Recommendations: See Admitting Provider Note  Report given to:   Additional Notes: ,

## 2022-08-04 NOTE — Progress Notes (Signed)
[name] Progress Note  Subjective: pt had drain placed by IR yesterday.  Today is in pain. Pt seen in HD unit.   Vitals:   08/04/22 1055 08/04/22 1221 08/04/22 1315 08/04/22 1330  BP:  (!) 139/54 133/67 133/62  Pulse:  98    Resp:  '18 13 13  '$ Temp:  98.8 F (37.1 C)    TempSrc: Oral     SpO2:  94%    Weight:      Height:        Exam: Gen alert, no distress. On RA No jvd or bruits Chest clear bilat to bases RRR no MRG Abd soft ntnd no mass or ascites +bs Ext no LE or UE edema, Neuro is alert, Ox 3 , nf    LUA AVG+bruit      Home meds include - norvasc, aspririn, lipitor, buspar, coreg 12.5 1-2x per day, sensipar, gabapentin 100 hs, glucotrol, isosorbide-hydralazine, prilosec, trazodone, ursodiol, symbicort, advair, eyedrops/ prns/ vits/ supps        OP HD: [place] MWF  4h  400/600   57.4kg   2/3.5 bath LUA AVG  Heparin 2500+ 1078mdrun - last HD 1/5, post wt 57.3kg - hectorol 7 ug IV tiw - venofer 50 q week - mircera 30 ug q4 wks, last 12/8, due now     Assessment/ Plan: Abd pain/ intra-abdominal infection or abscess - per CT imaging. Gen surg and IR consulted, started on IV abx. IR placed drain in RLQ fluid collection. Fluid sent for culture. Per pmd.  ESRD - on HD MWF. Had HD this am off schedule. HD tomorrow to get back on schedule.  HTN/ volume - no edema on exam, on 2L [place], up 5-6kg if wts are accurate. Will get port CXR.  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 Cont IV vdra.  DM 2 - per pmd COPD/ asthma - no symptoms     Rob Chiana Wamser 08/04/2022, 1:40 PM   Recent Labs  Lab 08/03/22 0422 08/04/22 0438  HGB 10.9* 10.4*  ALBUMIN 2.3* 2.2*  CALCIUM 9.2 9.6  CREATININE 6.91* 8.10*  K 3.2* 4.4   Recent Labs  Lab 08/03/22 0008  IRON 26*  TIBC 125*  FERRITIN 1,457*   Inpatient medications:  atorvastatin  40 mg Oral QHS   carvedilol  12.5 mg Oral Q M,W,F-HD   carvedilol  12.5 mg Oral 2 times per day on Tue  Thu Sat   Chlorhexidine Gluconate Cloth  6 each Topical Q0600   fluticasone furoate-vilanterol  1 puff Inhalation Daily   gabapentin  100 mg Oral QHS   insulin aspart  0-6 Units Subcutaneous TID WC   isosorbide-hydrALAZINE  1 tablet Oral 2 times per day on Sun Tue Thu Sat   isosorbide-hydrALAZINE  1 tablet Oral Q M,W,F-HD   ondansetron (ZOFRAN) IV  4 mg Intravenous Once   pantoprazole (PROTONIX) IV  40 mg Intravenous Q12H   sodium chloride flush  5 mL Intracatheter Q8H   ursodiol  300 mg Oral BID    piperacillin-tazobactam Stopped (08/03/22 2320)   albuterol, busPIRone, fentaNYL (SUBLIMAZE) injection, fluticasone, hydrALAZINE, HYDROmorphone (DILAUDID) injection, ondansetron (ZOFRAN) IV, ondansetron, oxyCODONE, oxyCODONE, prochlorperazine, traZODone

## 2022-08-04 NOTE — Progress Notes (Signed)
Pt arrived to the floor around 1500 from the ED via stretcher. Pt transferred to hospital bed from stretcher with one assist. Pt alert and oriented x4 in no acute distress upon arrival to floor. VSS. Respirations even and unlabored on 2 L/min. Pt oriented to room. Pt encouraged to use call bell. Call bell within reach. Bed in low position.

## 2022-08-05 DIAGNOSIS — K659 Peritonitis, unspecified: Secondary | ICD-10-CM | POA: Diagnosis not present

## 2022-08-05 LAB — CBC
HCT: 46.8 % — ABNORMAL HIGH (ref 36.0–46.0)
Hemoglobin: 15.2 g/dL — ABNORMAL HIGH (ref 12.0–15.0)
MCH: 31.8 pg (ref 26.0–34.0)
MCHC: 32.5 g/dL (ref 30.0–36.0)
MCV: 97.9 fL (ref 80.0–100.0)
Platelets: 196 10*3/uL (ref 150–400)
RBC: 4.78 MIL/uL (ref 3.87–5.11)
RDW: 13.2 % (ref 11.5–15.5)
WBC: 13.6 10*3/uL — ABNORMAL HIGH (ref 4.0–10.5)
nRBC: 0 % (ref 0.0–0.2)

## 2022-08-05 LAB — COMPREHENSIVE METABOLIC PANEL
ALT: 10 U/L (ref 0–44)
AST: 15 U/L (ref 15–41)
Albumin: 2.2 g/dL — ABNORMAL LOW (ref 3.5–5.0)
Alkaline Phosphatase: 73 U/L (ref 38–126)
Anion gap: 16 — ABNORMAL HIGH (ref 5–15)
BUN: 22 mg/dL (ref 8–23)
CO2: 25 mmol/L (ref 22–32)
Calcium: 9.4 mg/dL (ref 8.9–10.3)
Chloride: 88 mmol/L — ABNORMAL LOW (ref 98–111)
Creatinine, Ser: 5.14 mg/dL — ABNORMAL HIGH (ref 0.44–1.00)
GFR, Estimated: 8 mL/min — ABNORMAL LOW (ref >60)
Glucose, Bld: 152 mg/dL — ABNORMAL HIGH (ref 70–99)
Potassium: 3.3 mmol/L — ABNORMAL LOW (ref 3.5–5.1)
Sodium: 129 mmol/L — ABNORMAL LOW (ref 135–145)
Total Bilirubin: 0.5 mg/dL (ref 0.3–1.2)
Total Protein: 6 g/dL — ABNORMAL LOW (ref 6.5–8.1)

## 2022-08-05 LAB — GLUCOSE, CAPILLARY
Glucose-Capillary: 160 mg/dL — ABNORMAL HIGH (ref 70–99)
Glucose-Capillary: 141 mg/dL — ABNORMAL HIGH (ref 70–99)
Glucose-Capillary: 150 mg/dL — ABNORMAL HIGH (ref 70–99)
Glucose-Capillary: 162 mg/dL — ABNORMAL HIGH (ref 70–99)

## 2022-08-05 LAB — MAGNESIUM: Magnesium: 2 mg/dL (ref 1.7–2.4)

## 2022-08-05 MED ORDER — HEPARIN SODIUM (PORCINE) 1000 UNIT/ML DIALYSIS
2500.0000 [IU] | INTRAMUSCULAR | Status: DC | PRN
  Administered 2022-08-05: 2500 [IU] via INTRAVENOUS_CENTRAL
  Filled 2022-08-05: qty 3

## 2022-08-05 MED ORDER — HEPARIN SODIUM (PORCINE) 5000 UNIT/ML IJ SOLN
5000.0000 [IU] | Freq: Three times a day (TID) | INTRAMUSCULAR | Status: DC
  Administered 2022-08-05 – 2022-08-10 (×15): 5000 [IU] via SUBCUTANEOUS
  Filled 2022-08-05 (×14): qty 1

## 2022-08-05 MED ORDER — PENTAFLUOROPROP-TETRAFLUOROETH EX AERO
INHALATION_SPRAY | CUTANEOUS | Status: AC
  Filled 2022-08-05: qty 30

## 2022-08-05 NOTE — Progress Notes (Signed)
  Transition of Care [facility]) Screening Note   Patient Details  Name: Destiny Day Date of Birth: 17-Feb-1951   Transition of Care [facility]) CM/SW Contact:    Benard Halsted, [employer] Phone Number: 08/05/2022, 6:11 PM    Transition of Care Department [facility]) has reviewed patient and no TOC needs have been identified at this time. We will continue to monitor patient advancement through interdisciplinary progression rounds. If new patient transition needs arise, please place a TOC consult.

## 2022-08-05 NOTE — Progress Notes (Signed)
[facility] Progress Note  Subjective: pt had drain placed by IR yesterday.  Today is in pain. Pt seen in HD unit.   Vitals:   08/05/22 1130 08/05/22 1134 08/05/22 1149 08/05/22 1242  BP: (!) 144/46 (!) 146/59 (!) 197/39 (!) 157/45  Pulse: 71 70 77 78  Resp: '18 17 16   '$ Temp:   97.7 F (36.5 C)   TempSrc:   Oral   SpO2: 98% 97% 96%   Weight:   57.5 kg   Height:        Exam: Gen alert, no distress. On RA No jvd or bruits Chest clear bilat to bases RRR no MRG Abd soft ntnd no mass or ascites +bs Ext no LE or UE edema, Neuro is alert, Ox 3 , nf    LUA AVG+bruit      Home meds include - norvasc, aspirin, lipitor, buspar, coreg 12.5 1-2x per day, sensipar, gabapentin 100 hs, glucotrol, isosorbide-hydralazine, prilosec, trazodone, ursodiol, symbicort, advair, eyedrops/ prns/ vits/ supps        OP HD: [place] MWF  4h  400/600   57.4kg   2/3.5 bath LUA AVG  Heparin 2500+ 1013mdrun - last HD 1/5, post wt 57.3kg - hectorol 7 ug IV tiw - venofer 50 q week - mircera 30 ug q4 wks, last 12/8, due now    CXR 1/09 - no acute process   Assessment/ Plan: Abd pain/ intra-abdominal infection or abscess - per CT imaging. Gen surg and IR consulted, started on IV abx. IR placed drain in RLQ fluid collection. Fluid sent for culture, growing GNR's. Per pmd.  ESRD - on HD MWF. HD today.  HTN/ volume - no edema on exam, CXR neg, 2kg up today. UF goal 1.5- 2 L as tolerated.  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 Cont IV vdra.  DM 2 - per pmd COPD/ asthma - no symptoms   Rob Brenin Heidelberger 08/05/2022, 2:53 PM   Recent Labs  Lab 08/04/22 0438 08/05/22 0403  HGB 10.4* 15.2*  ALBUMIN 2.2* 2.2*  CALCIUM 9.6 9.4  PHOS 6.6*  --   CREATININE 8.10* 5.14*  K 4.4 3.3*    Recent Labs  Lab 08/03/22 0008  IRON 26*  TIBC 125*  FERRITIN 1,457*    Inpatient medications:  atorvastatin  40 mg Oral QHS   carvedilol  12.5 mg Oral Q M,W,F-HD    carvedilol  12.5 mg Oral 2 times per day on Tue Thu Sat   Chlorhexidine Gluconate Cloth  6 each Topical Q0600   Chlorhexidine Gluconate Cloth  6 each Topical Q0600   doxercalciferol  7 mcg Intravenous Q M,W,F-HD   fluticasone furoate-vilanterol  1 puff Inhalation Daily   gabapentin  100 mg Oral QHS   heparin injection (subcutaneous)  5,000 Units Subcutaneous Q8H   insulin aspart  0-6 Units Subcutaneous TID WC   isosorbide-hydrALAZINE  1 tablet Oral 2 times per day on Sun Tue Thu Sat   isosorbide-hydrALAZINE  1 tablet Oral Q M,W,F-HD   ondansetron (ZOFRAN) IV  4 mg Intravenous Once   pantoprazole (PROTONIX) IV  40 mg Intravenous Q12H   pentafluoroprop-tetrafluoroeth       sodium chloride flush  5 mL Intracatheter Q8H   ursodiol  300 mg Oral BID    piperacillin-tazobactam 2.25 g (08/05/22 0524)   albuterol, busPIRone, fentaNYL (SUBLIMAZE) injection, fluticasone, hydrALAZINE, HYDROmorphone (DILAUDID) injection, ondansetron (ZOFRAN) IV, ondansetron, oxyCODONE, oxyCODONE, pentafluoroprop-tetrafluoroeth, prochlorperazine, traZODone

## 2022-08-05 NOTE — Progress Notes (Signed)
[facility] Progress Note     Subjective: On HD.  Admits to some abdominal pain that has just started within the last hour, although RN states she has been like this yesterday as well.   Objective: Vital signs in last 24 hours: Temp:  [97.3 F (36.3 C)-98.8 F (37.1 C)] 97.7 F (36.5 C) (01/10 0727) Pulse Rate:  [64-98] 88 (01/10 0900) Resp:  [10-28] 28 (01/10 0900) BP: (104-169)/(43-85) 169/63 (01/10 0900) SpO2:  [91 %-100 %] 100 % (01/10 0900) Weight:  [59.4 kg-59.5 kg] 59.5 kg (01/10 0727) Last BM Date : 08/04/22  Intake/Output from previous day: 01/09 0701 - 01/10 0700 In: 50 [IV Piggyback:50] Out: 2  Intake/Output this shift: No intake/output data recorded.  PE: Gen:  Alert, moaning at times Abd: Soft, ND, some diffuse abdominal pain with some mild guarding, RLQ drain with no output currently   Lab Results:  Recent Labs    08/04/22 0438 08/05/22 0403  WBC 24.8* 13.6*  HGB 10.4* 15.2*  HCT 32.2* 46.8*  PLT 288 196   BMET Recent Labs    08/04/22 0438 08/05/22 0403  NA 131* 129*  K 4.4 3.3*  CL 92* 88*  CO2 25 25  GLUCOSE 148* 152*  BUN 32* 22  CREATININE 8.10* 5.14*  CALCIUM 9.6 9.4   PT/INR Recent Labs    08/03/22 0422  LABPROT 13.3  INR 1.0   CMP     Component Value Date/Time   NA 129 (L) 08/05/2022 0403   NA 135 (A) 07/15/2016 0000   K 3.3 (L) 08/05/2022 0403   CL 88 (L) 08/05/2022 0403   CO2 25 08/05/2022 0403   GLUCOSE 152 (H) 08/05/2022 0403   BUN 22 08/05/2022 0403   BUN 30 (A) 07/15/2016 0000   CREATININE 5.14 (H) 08/05/2022 0403   CALCIUM 9.4 08/05/2022 0403   PROT 6.0 (L) 08/05/2022 0403   ALBUMIN 2.2 (L) 08/05/2022 0403   AST 15 08/05/2022 0403   ALT 10 08/05/2022 0403   ALKPHOS 73 08/05/2022 0403   BILITOT 0.5 08/05/2022 0403   GFRNONAA 8 (L) 08/05/2022 0403   GFRAA 6 (L) 06/26/2019 0500   Lipase     Component Value Date/Time   LIPASE 32 07/27/2022 1730       Studies/Results: DG CHEST PORT 1  VIEW  Result Date: 08/04/2022 CLINICAL DATA:  Hypoxia EXAM: PORTABLE CHEST 1 VIEW COMPARISON:  None Available. FINDINGS: Normal mediastinum and cardiac silhouette. Normal pulmonary vasculature. No evidence of effusion, infiltrate, or pneumothorax. No acute bony abnormality. IMPRESSION: No acute cardiopulmonary process. Electronically Signed   By: Suzy Bouchard M.D.   On: 08/04/2022 15:15   CT GUIDED PERITONEAL/RETROPERITONEAL FLUID DRAIN BY PERC CATH  Result Date: 08/03/2022 INDICATION: 72 year old female referred for drainage right pelvic abscess EXAM: CT-GUIDED DRAINAGE RIGHT PELVIC ABSCESS TECHNIQUE: Multidetector CT imaging of the pelvis was performed following the standard protocol without IV contrast. RADIATION DOSE REDUCTION: This exam was performed according to the departmental dose-optimization program which includes automated exposure control, adjustment of the mA and/or kV according to patient size and/or use of iterative reconstruction technique. MEDICATIONS: The patient is currently admitted to the hospital and receiving intravenous antibiotics. The antibiotics were administered within an appropriate time frame prior to the initiation of the procedure. ANESTHESIA/SEDATION: Moderate (conscious) sedation was employed during this procedure. A total of Versed 1.0 mg and Fentanyl 50 mcg was administered intravenously by the radiology nurse. Total intra-service moderate Sedation Time: 20 minutes. The patient's level of  consciousness and vital signs were monitored continuously by radiology nursing throughout the procedure under my direct supervision. COMPLICATIONS: None PROCEDURE: Informed written consent was obtained from the patient after a thorough discussion of the procedural risks, benefits and alternatives. All questions were addressed. Maximal Sterile Barrier Technique was utilized including caps, mask, sterile gowns, sterile gloves, sterile drape, hand hygiene and skin antiseptic. A timeout was  performed prior to the initiation of the procedure. Patient was position supine on the CT gantry table. Scout CT was acquired for planning purposes. The patient was then prepped and draped in usual sterile fashion. 1% lidocaine was used for local anesthesia. A small stab incision was made with 11 blade scalpel. Trocar needle was then advanced into the abscess associated with the right adnexa. Using modified Seldinger technique, 10 French drain was placed. Approximately 30 cc of frankly purulent material aspirated. Culture was sent for analysis. Drain was sutured in position and attached to bulb suction. Patient tolerated the procedure well and remained hemodynamically stable throughout. No complications were encountered and no significant blood loss. IMPRESSION: Status post CT-guided drainage of right pelvic abscess. Signed, Dulcy Fanny. Nadene Rubins, RPVI [facility] Electronically Signed   By: Corrie Mckusick D.O.   On: 08/03/2022 15:27    Anti-infectives: Anti-infectives (From admission, onward)    Start     Dose/Rate Route Frequency Ordered Stop   08/16/2022 2245  piperacillin-tazobactam (ZOSYN) IVPB 2.25 g        2.25 g 100 mL/hr over 30 Minutes Intravenous Every 8 hours 08/22/2022 2215     08/04/2022 1900  piperacillin-tazobactam (ZOSYN) IVPB 3.375 g        3.375 g 100 mL/hr over 30 Minutes Intravenous  Once 08/12/2022 1853 07/30/2022 2049        Assessment/Plan Pelvic abscess, likely perforated appendicitis   -VSS WBC down to 13 today, AF -Overall abdominal exam seems stable from yesterday.  Labs and vitals are all stable and actually improving.  No overt evidence of worsening status.  Will monitor -No gastric or small bowel dilation on CT  -Continue PRN antiemetics and monitor nausea  -will need to continue her drain and follow up with IR in drain clinic as outpatient.  Will also need to follow up with Dr. Donne Hazel to discuss possible  need for surgical intervention in the next couple of months.  FEN: renal diet with fluid restriction 1274m ID: [id number] [email or web address]  VTE: SCD's Foley: none  ESRD CHF HTN Neuropathy HTN OSA    LOS: 3 days   I reviewed nursing notes, hospitalist notes, last 24 h vitals and pain scores, last 48 h intake and output, last 24 h labs and trends, and last 24 h imaging results.  KHenreitta Cea9:39 AM 08/05/2022 Please refer to Amion for oncall pager numbers

## 2022-08-05 NOTE — Progress Notes (Signed)
   08/05/22 1149  Vitals  Temp 97.7 F (36.5 C)  Temp Source Oral  BP (!) 197/39  MAP (mmHg) 82  BP Location Right Arm  BP Method Automatic  Patient Position (if appropriate) Lying  Pulse Rate 77  Pulse Rate Source Monitor  ECG Heart Rate 77  Resp 16  Oxygen Therapy  SpO2 96 %  O2 Device Room Air  Pulse Oximetry Type Continuous   Received patient in bed to unit.  Alert and oriented.  Informed consent signed and in chart.   Treatment initiated: 0801  Treatment completed: 1134  Patient tolerated well.  Transported back to the room  Alert, without acute distress.  Hand-off given to patient's nurse.   Access used: AVG Access issues: some issues sticking d/t infiltration that occurred last tx  Total UF removed: 1800 ml Medication(s) given: Heparin 2500 units bolus, Hectorol 41mg IVP, Dilaudid 0.'5mg'$  IVP Post HD VS: see above Post HD weight: 57.5kg   [facility]

## 2022-08-05 NOTE — Progress Notes (Signed)
Patient transported to dialysis in stable condition, A&Ox4, CBG 169, Vital signs stable

## 2022-08-05 NOTE — Plan of Care (Signed)
  Problem: Fluid Volume: Goal: Ability to maintain a balanced intake and output will improve Outcome: Progressing   Problem: Health Behavior/Discharge Planning: Goal: Ability to manage health-related needs will improve Outcome: Progressing   Problem: Metabolic: Goal: Ability to maintain appropriate glucose levels will improve Outcome: Progressing   Problem: Nutritional: Goal: Maintenance of adequate nutrition will improve Outcome: Progressing Goal: Progress toward achieving an optimal weight will improve Outcome: Progressing   Problem: Tissue Perfusion: Goal: Adequacy of tissue perfusion will improve Outcome: Progressing   Problem: Clinical Measurements: Goal: Will remain free from infection Outcome: Progressing

## 2022-08-05 NOTE — Progress Notes (Signed)
PROGRESS NOTE  SHREE ESPEY CWC:[id number] DOB: 10-Jan-1951 DOA: 08/19/2022 PCP: Cipriano Mile, NP   LOS: 3 days   Brief Narrative / Interim history: 72 year old female with history of ESRD on MWF HD, asthma/COPD, diastolic CHF, HTN, HLD, DM2, comes into the hospital with few days of right lower quadrant abdominal pain.  Imaging in the ED concerning for perforated appendicitis with a periappendiceal abscess with probably secondary involvement of the previously noted adnexal mass or a tubo-ovarian abscess.  She was placed on antibiotics, general surgery was consulted and she was admitted to the hospital.  Subjective / 24h Interval events: No significant events overnight, she reports generalized weakness and fatigue, reports abdominal pain has improved  Assesement and Plan:  Intra-abdominal abscess, concern for perforated appendicitis  -patient started on antibiotics with Zosyn, continue.  General surgery consulted, IR consulted and she is status post right lower quadrant abscess drain placement.  Microbiology so far shows GNR, GPC in chains and GPR.  Continue Zosyn for now, tailor antibiotics based on speciation and sensitivities -White blood cell count trending down -Drain management per IR, will need repeat imaging at some point. -General surgery on board as well, will need surgery at some point  Essential hypertension-resume home medications.  ESRD on HD-nephrology consulted,HD per renal   Hyperlipidemia-resume home statin  Chronic diastolic CHF -appears euvolemic, volume management per HD  Anxiety-continue home BuSpar as needed  Asthma/COPD-stable, no wheezing, no exacerbation  Hypokalemia-replete and continue to monitor, potassium normalized this morning  Hyponatremia-fluid status managed with HD  DM2-continue sliding scale.  CBG (last 3)  Recent Labs    08/04/22 2143 08/05/22 0659 08/05/22 1236  GLUCAP 233* 160* 141*    Scheduled Meds:  atorvastatin  40 mg  Oral QHS   carvedilol  12.5 mg Oral Q M,W,F-HD   carvedilol  12.5 mg Oral 2 times per day on Tue Thu Sat   Chlorhexidine Gluconate Cloth  6 each Topical Q0600   Chlorhexidine Gluconate Cloth  6 each Topical Q0600   doxercalciferol  7 mcg Intravenous Q M,W,F-HD   fluticasone furoate-vilanterol  1 puff Inhalation Daily   gabapentin  100 mg Oral QHS   insulin aspart  0-6 Units Subcutaneous TID WC   isosorbide-hydrALAZINE  1 tablet Oral 2 times per day on Sun Tue Thu Sat   isosorbide-hydrALAZINE  1 tablet Oral Q M,W,F-HD   ondansetron (ZOFRAN) IV  4 mg Intravenous Once   pantoprazole (PROTONIX) IV  40 mg Intravenous Q12H   pentafluoroprop-tetrafluoroeth       sodium chloride flush  5 mL Intracatheter Q8H   ursodiol  300 mg Oral BID   Continuous Infusions:  piperacillin-tazobactam 2.25 g (08/05/22 0524)   PRN Meds:.albuterol, busPIRone, fentaNYL (SUBLIMAZE) injection, fluticasone, hydrALAZINE, HYDROmorphone (DILAUDID) injection, ondansetron (ZOFRAN) IV, ondansetron, oxyCODONE, oxyCODONE, pentafluoroprop-tetrafluoroeth, prochlorperazine, traZODone  Current Outpatient Medications  Medication Instructions   albuterol (VENTOLIN HFA) 108 (90 Base) MCG/ACT inhaler 2 puffs, Inhalation, Every 4 hours PRN   amLODipine (NORVASC) 10 mg, Oral, Daily   aspirin EC 81 mg, Oral, Daily   atorvastatin (LIPITOR) 40 mg, Oral, Daily at bedtime   b complex-vitamin c-folic acid (NEPHRO-VITE) 0.8 MG TABS tablet 1 tablet, Oral, Daily at bedtime   budesonide-formoterol (SYMBICORT) 160-4.5 MCG/ACT inhaler 2 puffs, Inhalation, 2 times daily   busPIRone (BUSPAR) 7.5 mg, Oral, Daily PRN   carvedilol (COREG) 12.5 mg, Oral, See admin instructions, Take 1 tablet (12.5 mg) by mouth once daily in the evening on Mondays, Wednesdays, & Fridays. (Dialysis)<BR>Take  1 tablet (12.5 mg) by mouth twice daily on 'Sundays, Tuesdays, Thursdays, & Saturdays. (Non Dialysis)   cinacalcet (SENSIPAR) 60 mg, Oral, See admin instructions,  Take 1 tablet (60 mg) by mouth in the evening on Mondays, Wednesdays, & Fridays (dialysis)<BR>Take 1 tablet (60 mg) by mouth twice daily on Sundays, Tuesdays, Thursdays, Saturdays. (non dialysis)   diphenhydrAMINE HCl (BENADRYL ALLERGY PO) 1 capsule, Oral, Every M-W-F (Hemodialysis), If needed to help with allergies   Doxercalciferol (HECTOROL IV) Doxercalciferol (Hectorol)   EPINEPHrine (AUVI-Q) 0.3 mg, Intramuscular, As needed   ferric citrate (AURYXIA) 210-420 mg, Oral, See admin instructions, Take 2 tablets (420 mg) by mouth with each meal & take 1 tablet (210 mg) by mouth with snacks.    fluticasone (FLONASE) 50 MCG/ACT nasal spray 2 sprays, Each Nare, Daily PRN   fluticasone-salmeterol (ADVAIR HFA) 115-21 MCG/ACT inhaler Inhale two puffs twice daily   gabapentin (NEURONTIN) 100 mg, Oral, Daily at bedtime, When necessary for neuropathy pain   glipiZIDE (GLUCOTROL) 5 mg, Oral, Daily at bedtime   isosorbide-hydrALAZINE (BIDIL) 20-37.5 MG tablet 1 tablet, Oral, See admin instructions, hs on Monday,Wednesday,Friday (dialysis days)<BR>Tid on Tuesday,Thursday,Saturday,Sunday(non-dialysis days)   latanoprost (XALATAN) 0.005 % ophthalmic solution 1 drop, Left Eye, Daily at bedtime   levocetirizine (XYZAL) 5 mg, Oral, Every evening   lidocaine-prilocaine (EMLA) cream 1 Application, Topical, As needed   loperamide (IMODIUM) 4 mg, Oral, 3 times daily PRN   Methoxy PEG-Epoetin Beta (MIRCERA IJ) Mircera   montelukast (SINGULAIR) 10 MG tablet TAKE 1 TABLET BY MOUTH EVERYDAY AT BEDTIME   ofloxacin (OCUFLOX) 0.3 % ophthalmic solution 1 drop, 4 times daily   omeprazole (PRILOSEC) 20 mg, Oral, Daily PRN   ondansetron (ZOFRAN) 4 mg, Oral, Every 6 hours PRN   prednisoLONE acetate (PRED FORTE) 1 % ophthalmic suspension 1 drop, 4 times daily   promethazine-dextromethorphan (PROMETHAZINE-DM) 6.25-15 MG/5ML syrup 5 mLs, Oral, 4 times daily PRN   Spacer/Aero-Holding Chambers (AEROCHAMBER PLUS) inhaler Use with  inhaler   traZODone (DESYREL) 25-50 mg, Oral, At bedtime PRN   ursodiol (ACTIGALL) 250 mg, Oral, 2 times daily    Diet Orders (From admission, onward)     Start     Ordered   08/05/22 0946  Diet renal with fluid restriction Fluid restriction: 1200 mL Fluid; Room service appropriate? Yes; Fluid consistency: Thin  Diet effective now       Question Answer Comment  Fluid restriction: 1200 mL Fluid   Room service appropriate? Yes   Fluid consistency: Thin      01'$ /10/24 0945            DVT prophylaxis: SCDs Start: 08/13/2022 2213   Lab Results  Component Value Date   PLT 196 08/05/2022      Code Status: Full Code  Family Communication: no family at bedside   Status is: Inpatient  Remains inpatient appropriate because: severity of illness  Level of care: Progressive  Consultants:  General surgery IR Nephrology   Objective: Vitals:   08/05/22 1130 08/05/22 1134 08/05/22 1149 08/05/22 1242  BP: (!) 144/46 (!) 146/59 (!) 197/39 (!) 157/45  Pulse: 71 70 77 78  Resp: '18 17 16   '$ Temp:   97.7 F (36.5 C)   TempSrc:   Oral   SpO2: 98% 97% 96%   Weight:   57.5 kg   Height:        Intake/Output Summary (Last 24 hours) at 08/05/2022 1344 Last data filed at 08/05/2022 1134 Gross per 24 hour  Intake  50 ml  Output 1800 ml  Net -1750 ml   Wt Readings from Last 3 Encounters:  08/05/22 57.5 kg  05/29/22 63.5 kg  02/19/22 61.7 kg    Examination:  Awake Alert, Oriented X 3, No new F.N deficits, Normal affect Symmetrical Chest wall movement, Good air movement bilaterally, CTAB RRR,No Gallops,Rubs or new Murmurs, No Parasternal Heave +ve B.Sounds, Abd Soft, right lower quadrant drain present No Cyanosis, Clubbing or edema, No new Rash or bruise     Data Reviewed: I have independently reviewed following labs and imaging studies   CBC Recent Labs  Lab 07/30/22 1821 08/15/2022 1730 08/03/22 0008 08/03/22 0422 08/04/22 0438 08/05/22 0403  WBC 13.7* 19.6*  --   21.7* 24.8* 13.6*  HGB 13.6 11.6* 10.9* 10.9* 10.4* 15.2*  HCT 41.9 36.5 34.2* 32.7* 32.2* 46.8*  PLT 237 243  --  264 288 196  MCV 97.9 100.0  --  97.0 99.4 97.9  MCH 31.8 31.8  --  32.3 32.1 31.8  MCHC 32.5 31.8  --  33.3 32.3 32.5  RDW 13.0 12.9  --  13.0 13.2 13.2  LYMPHSABS 1.3 0.9  --   --   --   --   MONOABS 0.6 0.7  --   --   --   --   EOSABS 0.1 0.1  --   --   --   --   BASOSABS 0.0 0.0  --   --   --   --     Recent Labs  Lab 07/30/22 1821 08/26/2022 1730 08/03/22 0008 08/03/22 0422 08/04/22 0438 08/05/22 0403  NA 134* 129*  --  130* 131* 129*  K 3.3* 3.3*  --  3.2* 4.4 3.3*  CL 91* 88*  --  92* 92* 88*  CO2 27 25  --  '26 25 25  '$ GLUCOSE 128* 250*  --  165* 148* 152*  BUN 13 21  --  23 32* 22  CREATININE 5.19* 6.42*  --  6.91* 8.10* 5.14*  CALCIUM 10.2 9.7  --  9.2 9.6 9.4  AST 15 19  --  '15 15 15  '$ ALT 11 9  --  '9 10 10  '$ ALKPHOS 84 93  --  86 86 73  BILITOT 0.6 0.6  --  0.6 0.6 0.5  ALBUMIN 3.4* 2.7*  --  2.3* 2.2* 2.2*  MG  --   --  1.8  --  2.1 2.0  CRP  --   --  20.4*  --   --   --   PROCALCITON  --   --  2.03 1.62 2.02  --   LATICACIDVEN  --   --  1.1  --   --   --   INR  --   --   --  1.0  --   --   TSH  --   --  0.760  --   --   --   HGBA1C  --   --  6.2*  --   --   --   BNP  --   --  4,098.1*  --   --   --     ------------------------------------------------------------------------------------------------------------------ No results for input(s): "CHOL", "HDL", "LDLCALC", "TRIG", "CHOLHDL", "LDLDIRECT" in the last 72 hours.  Lab Results  Component Value Date   HGBA1C 6.2 (H) 08/03/2022   ------------------------------------------------------------------------------------------------------------------ Recent Labs    08/03/22 0008  TSH 0.760    Cardiac Enzymes No results for input(s): "CKMB", "TROPONINI", "MYOGLOBIN" in  the last 168 hours.  Invalid input(s):  "CK" ------------------------------------------------------------------------------------------------------------------    Component Value Date/Time   BNP 1,676.4 (H) 08/03/2022 0008    CBG: Recent Labs  Lab 08/04/22 1140 08/04/22 1627 08/04/22 2143 08/05/22 0659 08/05/22 1236  GLUCAP 121* 239* 233* 160* 141*    Recent Results (from the past 240 hour(s))  Aerobic/Anaerobic Culture w Gram Stain (surgical/deep wound)     Status: None (Preliminary result)   Collection Time: 08/03/22  1:26 PM   Specimen: Wound; Abscess  Result Value Ref Range Status   Specimen Description WOUND  Final   Special Requests  ABCESS ABDOMEN  Final   Gram Stain   Final    ABUNDANT WBC PRESENT, PREDOMINANTLY PMN ABUNDANT GRAM NEGATIVE RODS FEW GRAM POSITIVE COCCI IN PAIRS IN CHAINS FEW GRAM POSITIVE RODS    Culture   Final    ABUNDANT GRAM NEGATIVE RODS IDENTIFICATION AND SUSCEPTIBILITIES TO FOLLOW HOLDING FOR POSSIBLE ANAEROBE Performed at [facility], [place] [address], [place], [place] [address]    Report Status PENDING  Incomplete     Radiology Studies: DG CHEST PORT 1 VIEW  Result Date: 08/04/2022 CLINICAL DATA:  Hypoxia EXAM: PORTABLE CHEST 1 VIEW COMPARISON:  None Available. FINDINGS: Normal mediastinum and cardiac silhouette. Normal pulmonary vasculature. No evidence of effusion, infiltrate, or pneumothorax. No acute bony abnormality. IMPRESSION: No acute cardiopulmonary process. Electronically Signed   By: Suzy Bouchard M.D.   On: 08/04/2022 15:15     Ron Beske MD [facility]  Between 7 am - 7 pm I am available, please contact me via Amion (for emergencies) or Securechat (non urgent messages)  Between 7 pm - 7 am I am not available, please contact night coverage MD/APP via Amion

## 2022-08-06 ENCOUNTER — Inpatient Hospital Stay (HOSPITAL_COMMUNITY): Payer: Medicare Other

## 2022-08-06 DIAGNOSIS — N281 Cyst of kidney, acquired: Secondary | ICD-10-CM | POA: Diagnosis not present

## 2022-08-06 DIAGNOSIS — K659 Peritonitis, unspecified: Secondary | ICD-10-CM | POA: Diagnosis not present

## 2022-08-06 DIAGNOSIS — N186 End stage renal disease: Secondary | ICD-10-CM | POA: Diagnosis not present

## 2022-08-06 DIAGNOSIS — K8020 Calculus of gallbladder without cholecystitis without obstruction: Secondary | ICD-10-CM | POA: Diagnosis not present

## 2022-08-06 LAB — CBC
HCT: 37.5 % (ref 36.0–46.0)
Hemoglobin: 12 g/dL (ref 12.0–15.0)
MCH: 31.8 pg (ref 26.0–34.0)
MCHC: 32 g/dL (ref 30.0–36.0)
MCV: 99.5 fL (ref 80.0–100.0)
Platelets: 382 10*3/uL (ref 150–400)
RBC: 3.77 MIL/uL — ABNORMAL LOW (ref 3.87–5.11)
RDW: 13.3 % (ref 11.5–15.5)
WBC: 20.2 10*3/uL — ABNORMAL HIGH (ref 4.0–10.5)
nRBC: 0 % (ref 0.0–0.2)

## 2022-08-06 LAB — GLUCOSE, CAPILLARY
Glucose-Capillary: 194 mg/dL — ABNORMAL HIGH (ref 70–99)
Glucose-Capillary: 171 mg/dL — ABNORMAL HIGH (ref 70–99)
Glucose-Capillary: 186 mg/dL — ABNORMAL HIGH (ref 70–99)
Glucose-Capillary: 177 mg/dL — ABNORMAL HIGH (ref 70–99)

## 2022-08-06 MED ORDER — IOHEXOL 350 MG/ML SOLN
75.0000 mL | Freq: Once | INTRAVENOUS | Status: AC | PRN
  Administered 2022-08-06: 75 mL via INTRAVENOUS

## 2022-08-06 MED ORDER — IOHEXOL 9 MG/ML PO SOLN
500.0000 mL | ORAL | Status: AC
  Administered 2022-08-06: 500 mL via ORAL

## 2022-08-06 MED ORDER — FERRIC CITRATE 1 GM 210 MG(FE) PO TABS
420.0000 mg | ORAL_TABLET | Freq: Three times a day (TID) | ORAL | Status: DC
  Administered 2022-08-06 – 2022-08-27 (×35): 420 mg via ORAL
  Filled 2022-08-06 (×43): qty 2

## 2022-08-06 NOTE — Progress Notes (Signed)
Subjective:  In Room complaining of some abdominal discomfort noted surgery ordering CT scan.  HD yesterday on schedule.  Objective Vital signs in last 24 hours: Vitals:   08/06/22 0800 08/06/22 0900 08/06/22 1000 08/06/22 1100  BP: (!) 131/43     Pulse: 68 69 74 76  Resp: 12 19 (!) 22 (!) 25  Temp:      TempSrc:      SpO2: 94% 96% 93% 94%  Weight:      Height:       Weight change: 0.1 kg  Physical Exam: General: Thin chronically ill elderly female, not in distress but complaining of abdominal discomfort Heart: RRR no MRG Lungs: CTA bilaterally nonlabored breathing Abdomen: NABS, soft, non-distended, tender right-sided,RLQ IR drain dark fluid Extremities: No pedal edema Dialysis Access: LUA AVG+ bruit   OP HD: [place] MWF  4h  400/600   57.4kg   2/3.5 bath LUA AVG  Heparin 2500+ 1019mdrun - last HD 1/5, post wt 57.3kg - hectorol 7 ug IV tiw - venofer 50 q week - mircera 30 ug q4 wks, last 12/8, due now    CXR 1/09 - no acute process   Problem/Plan: Abd pain/ intra-abdominal infection or abscess - per CT imaging. Gen surg and IR consulted, started on IV abx. IR placed drain in RLQ fluid collection. Fluid sent for culture, growing GNR's. Per pmd.///Repeating CT Scan  per surgery today/// ESRD - on HD MWF. HD on schedule yesterday next HD tomorrow HTN/ volume - no edema on exam, CXR neg, 2kg up today. 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  BMikey Bussingwhen taking meals DM 2 - per pmd COPD/ asthma - no symptoms   DErnest Haber PA-C [facility] [phone]/05/2023,11:58 AM  LOS: 4 days   Labs: Basic Metabolic Panel: Recent Labs  Lab 08/03/22 0422 08/04/22 0438 08/05/22 0403  NA 130* 131* 129*  K 3.2* 4.4 3.3*  CL 92* 92* 88*  CO2 '26 25 25  '$ GLUCOSE 165* 148* 152*  BUN 23 32* 22  CREATININE 6.91* 8.10* 5.14*  CALCIUM 9.2 9.6  9.4  PHOS  --  6.6*  --    Liver Function Tests: Recent Labs  Lab 08/03/22 0422 08/04/22 0438 08/05/22 0403  AST '15 15 15  '$ ALT '9 10 10  '$ ALKPHOS 86 86 73  BILITOT 0.6 0.6 0.5  PROT 5.9* 6.2* 6.0*  ALBUMIN 2.3* 2.2* 2.2*   Recent Labs  Lab 07/30/22 1821 08/04/2022 1730  LIPASE 34 32   No results for input(s): "AMMONIA" in the last 168 hours. CBC: Recent Labs  Lab 07/30/22 1821 08/19/2022 1730 08/03/22 0008 08/03/22 0422 08/04/22 0438 08/05/22 0403 08/06/22 0433  WBC 13.7* 19.6*  --  21.7* 24.8* 13.6* 20.2*  NEUTROABS 11.5* 17.7*  --   --   --   --   --   HGB 13.6 11.6*   < > 10.9* 10.4* 15.2* 12.0  HCT 41.9 36.5   < > 32.7* 32.2* 46.8* 37.5  MCV 97.9 100.0  --  97.0 99.4 97.9 99.5  PLT 237 243  --  264 288 196 382   < > = values in this interval not displayed.   Cardiac Enzymes: No results for input(s): "CKTOTAL", "CKMB", "CKMBINDEX", "TROPONINI" in the last 168 hours. CBG: Recent Labs  Lab 08/05/22 1236 08/05/22 1649 08/05/22 2144  08/06/22 0846 08/06/22 1137  GLUCAP 141* 150* 162* 194* 171*    Studies/Results: DG CHEST PORT 1 VIEW  Result Date: 08/04/2022 CLINICAL DATA:  Hypoxia EXAM: PORTABLE CHEST 1 VIEW COMPARISON:  None Available. FINDINGS: Normal mediastinum and cardiac silhouette. Normal pulmonary vasculature. No evidence of effusion, infiltrate, or pneumothorax. No acute bony abnormality. IMPRESSION: No acute cardiopulmonary process. Electronically Signed   By: Suzy Bouchard M.D.   On: 08/04/2022 15:15   Medications:  piperacillin-tazobactam 2.25 g (08/06/22 0628)    atorvastatin  40 mg Oral QHS   carvedilol  12.5 mg Oral Q M,W,F-HD   carvedilol  12.5 mg Oral 2 times per day on Tue Thu Sat   Chlorhexidine Gluconate Cloth  6 each Topical Q0600   Chlorhexidine Gluconate Cloth  6 each Topical Q0600   doxercalciferol  7 mcg Intravenous Q M,W,F-HD   fluticasone furoate-vilanterol  1 puff Inhalation Daily   gabapentin  100 mg Oral QHS   heparin  injection (subcutaneous)  5,000 Units Subcutaneous Q8H   insulin aspart  0-6 Units Subcutaneous TID WC   isosorbide-hydrALAZINE  1 tablet Oral 2 times per day on Sun Tue Thu Sat   isosorbide-hydrALAZINE  1 tablet Oral Q M,W,F-HD   ondansetron (ZOFRAN) IV  4 mg Intravenous Once   pantoprazole (PROTONIX) IV  40 mg Intravenous Q12H   sodium chloride flush  5 mL Intracatheter Q8H   ursodiol  300 mg Oral BID

## 2022-08-06 NOTE — Progress Notes (Signed)
[facility] Progress Note     Subjective: Cc worsening abdominal pain. Some confusion overnight, and slower to respond to questions this morning, but she is A&Ox3 for me (person, "surgical building", 2024)   Objective: Vital signs in last 24 hours: Temp:  [97.4 F (36.3 C)-98.4 F (36.9 C)] 98.4 F (36.9 C) (01/11 0500) Pulse Rate:  [68-93] 68 (01/11 0500) Resp:  [12-23] 17 (01/11 0500) BP: (104-197)/(33-80) 133/41 (01/11 0500) SpO2:  [96 %-98 %] 97 % (01/11 0500) Weight:  [57.5 kg] 57.5 kg (01/10 1149) Last BM Date : 08/04/22  Intake/Output from previous day: 01/10 0701 - 01/11 0700 In: 20  Out: 1850 [Drains:50] Intake/Output this shift: No intake/output data recorded.  PE: Gen:  Alert, moaning at times Abd: Soft, RUQ abdominal pain with guarding, RLQ IR drain with dark bilious vs thin feculent effluent   Lab Results:  Recent Labs    08/05/22 0403 08/06/22 0433  WBC 13.6* 20.2*  HGB 15.2* 12.0  HCT 46.8* 37.5  PLT 196 382   BMET Recent Labs    08/04/22 0438 08/05/22 0403  NA 131* 129*  K 4.4 3.3*  CL 92* 88*  CO2 25 25  GLUCOSE 148* 152*  BUN 32* 22  CREATININE 8.10* 5.14*  CALCIUM 9.6 9.4   PT/INR No results for input(s): "LABPROT", "INR" in the last 72 hours.  CMP     Component Value Date/Time   NA 129 (L) 08/05/2022 0403   NA 135 (A) 07/15/2016 0000   K 3.3 (L) 08/05/2022 0403   CL 88 (L) 08/05/2022 0403   CO2 25 08/05/2022 0403   GLUCOSE 152 (H) 08/05/2022 0403   BUN 22 08/05/2022 0403   BUN 30 (A) 07/15/2016 0000   CREATININE 5.14 (H) 08/05/2022 0403   CALCIUM 9.4 08/05/2022 0403   PROT 6.0 (L) 08/05/2022 0403   ALBUMIN 2.2 (L) 08/05/2022 0403   AST 15 08/05/2022 0403   ALT 10 08/05/2022 0403   ALKPHOS 73 08/05/2022 0403   BILITOT 0.5 08/05/2022 0403   GFRNONAA 8 (L) 08/05/2022 0403   GFRAA 6 (L) 06/26/2019 0500   Lipase     Component Value Date/Time   LIPASE 32 08/03/2022 1730    Studies/Results: DG CHEST PORT  1 VIEW  Result Date: 08/04/2022 CLINICAL DATA:  Hypoxia EXAM: PORTABLE CHEST 1 VIEW COMPARISON:  None Available. FINDINGS: Normal mediastinum and cardiac silhouette. Normal pulmonary vasculature. No evidence of effusion, infiltrate, or pneumothorax. No acute bony abnormality. IMPRESSION: No acute cardiopulmonary process. Electronically Signed   By: Suzy Bouchard M.D.   On: 08/04/2022 15:15    Anti-infectives: Anti-infectives (From admission, onward)    Start     Dose/Rate Route Frequency Ordered Stop   08/01/2022 2245  piperacillin-tazobactam (ZOSYN) IVPB 2.25 g        2.25 g 100 mL/hr over 30 Minutes Intravenous Every 8 hours 08/23/2022 2215     08/23/2022 1900  piperacillin-tazobactam (ZOSYN) IVPB 3.375 g        3.375 g 100 mL/hr over 30 Minutes Intravenous  Once 08/18/2022 1853 08/20/2022 2049        Assessment/Plan Pelvic abscess, likely perforated appendicitis   -VSS WBC 20 today - concerned about increased right sided abdominal pain and the drain changed in quality - now looks like stool or dark bile. Repeat ct scan today with IV and PO contrast to further evaluate.  -Continue PRN antiemetics and monitor nausea  - Will need to continue her drain and follow up  with IR in drain clinic as outpatient.  Will also need to follow up with Dr. Donne Hazel to discuss possible need for surgical intervention in the next couple of months.    FEN: renal diet with fluid restriction 1235m ID: [email or web address] [email or web address]  VTE: SCD's Foley: none  ESRD CHF HTN Neuropathy HTN OSA    LOS: 4 days   I reviewed nursing notes, hospitalist notes, last 24 h vitals and pain scores, last 48 h intake and output, last 24 h labs and trends, and last 24 h imaging results.  EDarci CurrentSimaan 10:12 AM 08/06/2022 Please refer to Amion for oncall pager numbers

## 2022-08-06 NOTE — Progress Notes (Signed)
Patient had episode of confusion in the night refusing medication, saying the medications were changed, husband and son came to visit and were able to talk with Patient. Complaints of abd pain and nausea prn pain medications and antiemetics were administered through the night. Report given to incoming nurse for monitoring

## 2022-08-06 NOTE — Progress Notes (Signed)
PROGRESS NOTE  Destiny Day FYB:[id number] DOB: Oct 09, 1950 DOA: 08/01/2022 PCP: Cipriano Mile, NP   LOS: 4 days   Brief Narrative / Interim history:  72 year old female with history of ESRD on MWF HD, asthma/COPD, diastolic CHF, HTN, HLD, DM2, comes into the hospital with few days of right lower quadrant abdominal pain.  Imaging in the ED concerning for perforated appendicitis with a periappendiceal abscess with probably secondary involvement of the previously noted adnexal mass or a tubo-ovarian abscess.  She was placed on antibiotics, general surgery was consulted and she was admitted to the hospital.  Subjective / 24h Interval events:  She is with some confusion overnight, still complains of abdominal pain  Assesement and Plan:  Intra-abdominal abscess, concern for perforated appendicitis  -patient started on antibiotics with Zosyn, continue.  General surgery consulted, IR consulted and she is status post right lower quadrant abscess drain placement.  Microbiology so far shows GNR, GPC in chains and GPR.  Continue Zosyn for now, tailor antibiotics based on speciation and sensitivities -He is with worsening leukocytosis, and abdominal pain, plan to repeat CT abdomen with IV contrast for further evaluation -Drain management per IR, will need repeat imaging at some point. -General surgery on board as well, will need surgery at some point  Essential hypertension-resume home medications.  ESRD on HD-nephrology consulted,HD per renal   Hyperlipidemia-resume home statin  Chronic diastolic CHF -appears euvolemic, volume management per HD  Anxiety-continue home BuSpar as needed  Asthma/COPD-stable, no wheezing, no exacerbation  Hypokalemia-replete and continue to monitor, potassium normalized this morning  Hyponatremia-fluid status managed with HD  DM2-continue sliding scale.  CBG (last 3)  Recent Labs    08/05/22 2144 08/06/22 0846 08/06/22 1137  GLUCAP 162* 194* 171*     Scheduled Meds:  atorvastatin  40 mg Oral QHS   carvedilol  12.5 mg Oral Q M,W,F-HD   carvedilol  12.5 mg Oral 2 times per day on Tue Thu Sat   Chlorhexidine Gluconate Cloth  6 each Topical Q0600   Chlorhexidine Gluconate Cloth  6 each Topical Q0600   doxercalciferol  7 mcg Intravenous Q M,W,F-HD   ferric citrate  420 mg Oral TID WC   fluticasone furoate-vilanterol  1 puff Inhalation Daily   gabapentin  100 mg Oral QHS   heparin injection (subcutaneous)  5,000 Units Subcutaneous Q8H   insulin aspart  0-6 Units Subcutaneous TID WC   isosorbide-hydrALAZINE  1 tablet Oral 2 times per day on Sun Tue Thu Sat   isosorbide-hydrALAZINE  1 tablet Oral Q M,W,F-HD   ondansetron (ZOFRAN) IV  4 mg Intravenous Once   pantoprazole (PROTONIX) IV  40 mg Intravenous Q12H   sodium chloride flush  5 mL Intracatheter Q8H   ursodiol  300 mg Oral BID   Continuous Infusions:  piperacillin-tazobactam 2.25 g (08/06/22 1400)   PRN Meds:.albuterol, busPIRone, fentaNYL (SUBLIMAZE) injection, fluticasone, hydrALAZINE, HYDROmorphone (DILAUDID) injection, ondansetron (ZOFRAN) IV, ondansetron, oxyCODONE, oxyCODONE, prochlorperazine, traZODone  Current Outpatient Medications  Medication Instructions   albuterol (VENTOLIN HFA) 108 (90 Base) MCG/ACT inhaler 2 puffs, Inhalation, Every 4 hours PRN   amLODipine (NORVASC) 10 mg, Oral, Daily   aspirin EC 81 mg, Oral, Daily   atorvastatin (LIPITOR) 40 mg, Oral, Daily at bedtime   b complex-vitamin c-folic acid (NEPHRO-VITE) 0.8 MG TABS tablet 1 tablet, Oral, Daily at bedtime   budesonide-formoterol (SYMBICORT) 160-4.5 MCG/ACT inhaler 2 puffs, Inhalation, 2 times daily   busPIRone (BUSPAR) 7.5 mg, Oral, Daily PRN   carvedilol (COREG) 12.5  mg, Oral, See admin instructions, Take 1 tablet (12.5 mg) by mouth once daily in the evening on Mondays, Wednesdays, & Fridays. (Dialysis)<BR>Take 1 tablet (12.5 mg) by mouth twice daily on 'Sundays, Tuesdays, Thursdays, & Saturdays.  (Non Dialysis)   cinacalcet (SENSIPAR) 60 mg, Oral, See admin instructions, Take 1 tablet (60 mg) by mouth in the evening on Mondays, Wednesdays, & Fridays (dialysis)<BR>Take 1 tablet (60 mg) by mouth twice daily on Sundays, Tuesdays, Thursdays, Saturdays. (non dialysis)   diphenhydrAMINE HCl (BENADRYL ALLERGY PO) 1 capsule, Oral, Every M-W-F (Hemodialysis), If needed to help with allergies   EPINEPHrine (AUVI-Q) 0.3 mg, Intramuscular, As needed   ferric citrate (AURYXIA) 210-420 mg, Oral, See admin instructions, Take 2 tablets (420 mg) by mouth with each meal & take 1 tablet (210 mg) by mouth with snacks.    fluticasone (FLONASE) 50 MCG/ACT nasal spray 2 sprays, Each Nare, Daily PRN   fluticasone-salmeterol (ADVAIR HFA) 115-21 MCG/ACT inhaler Inhale two puffs twice daily   gabapentin (NEURONTIN) 100 mg, Oral, Daily at bedtime, When necessary for neuropathy pain   glipiZIDE (GLUCOTROL) 5 mg, Oral, Daily at bedtime   isosorbide-hydrALAZINE (BIDIL) 20-37.5 MG tablet 1 tablet, Oral, See admin instructions, hs on Monday,Wednesday,Friday (dialysis days)<BR>Tid on Tuesday,Thursday,Saturday,Sunday(non-dialysis days)   latanoprost (XALATAN) 0.005 % ophthalmic solution 1 drop, Left Eye, Daily at bedtime   levocetirizine (XYZAL) 5 mg, Oral, Every evening   lidocaine-prilocaine (EMLA) cream 1 Application, Topical, As needed   loperamide (IMODIUM) 4 mg, Oral, 3 times daily PRN   Methoxy PEG-Epoetin Beta (MIRCERA IJ) Mircera   montelukast (SINGULAIR) 10 MG tablet TAKE 1 TABLET BY MOUTH EVERYDAY AT BEDTIME   ofloxacin (OCUFLOX) 0.3 % ophthalmic solution 1 drop, 4 times daily   omeprazole (PRILOSEC) 20 mg, Oral, Daily PRN   ondansetron (ZOFRAN) 4 mg, Oral, Every 6 hours PRN   prednisoLONE acetate (PRED FORTE) 1 % ophthalmic suspension 1 drop, 4 times daily   promethazine-dextromethorphan (PROMETHAZINE-DM) 6.25-15 MG/5ML syrup 5 mLs, Oral, 4 times daily PRN   Spacer/Aero-Holding Chambers (AEROCHAMBER PLUS)  inhaler Use with inhaler   traZODone (DESYREL) 25-50 mg, Oral, At bedtime PRN   ursodiol (ACTIGALL) 250 mg, Oral, 2 times daily    Diet Orders (From admission, onward)     Start     Ordered   08/05/22 0946  Diet renal with fluid restriction Fluid restriction: 1200 mL Fluid; Room service appropriate? Yes; Fluid consistency: Thin  Diet effective now       Question Answer Comment  Fluid restriction: 1200 mL Fluid   Room service appropriate? Yes   Fluid consistency: Thin      01'$ /10/24 0945            DVT prophylaxis: heparin injection 5,000 Units Start: 08/05/22 1445 SCDs Start: 07/29/2022 2213   Lab Results  Component Value Date   PLT 382 08/06/2022      Code Status: Full Code  Family Communication: D/W husband at bedside  Status is: Inpatient  Remains inpatient appropriate because: severity of illness  Level of care: Progressive  Consultants:  General surgery IR Nephrology   Objective: Vitals:   08/06/22 0800 08/06/22 0900 08/06/22 1000 08/06/22 1100  BP: (!) 131/43     Pulse: 68 69 74 76  Resp: 12 19 (!) 22 (!) 25  Temp:      TempSrc:      SpO2: 94% 96% 93% 94%  Weight:      Height:        Intake/Output Summary (Last  24 hours) at 08/06/2022 1417 Last data filed at 08/06/2022 8891 Gross per 24 hour  Intake 20 ml  Output 50 ml  Net -30 ml   Wt Readings from Last 3 Encounters:  08/05/22 57.5 kg  05/29/22 63.5 kg  02/19/22 61.7 kg    Examination:  Awake Alert, is oriented x 3, but slow to respond today with some confusion. Good air entry bilaterally RRR,No Gallops,Rubs or new Murmurs, No Parasternal Heave +ve B.Sounds, drain present in right lower quadrant, with dark bilious output No Cyanosis, Clubbing or edema, No new Rash or bruise      Data Reviewed: I have independently reviewed following labs and imaging studies   CBC Recent Labs  Lab 07/30/22 1821 08/15/2022 1730 08/03/22 0008 08/03/22 0422 08/04/22 0438 08/05/22 0403  08/06/22 0433  WBC 13.7* 19.6*  --  21.7* 24.8* 13.6* 20.2*  HGB 13.6 11.6* 10.9* 10.9* 10.4* 15.2* 12.0  HCT 41.9 36.5 34.2* 32.7* 32.2* 46.8* 37.5  PLT 237 243  --  264 288 196 382  MCV 97.9 100.0  --  97.0 99.4 97.9 99.5  MCH 31.8 31.8  --  32.3 32.1 31.8 31.8  MCHC 32.5 31.8  --  33.3 32.3 32.5 32.0  RDW 13.0 12.9  --  13.0 13.2 13.2 13.3  LYMPHSABS 1.3 0.9  --   --   --   --   --   MONOABS 0.6 0.7  --   --   --   --   --   EOSABS 0.1 0.1  --   --   --   --   --   BASOSABS 0.0 0.0  --   --   --   --   --     Recent Labs  Lab 07/30/22 1821 08/09/2022 1730 08/03/22 0008 08/03/22 0422 08/04/22 0438 08/05/22 0403  NA 134* 129*  --  130* 131* 129*  K 3.3* 3.3*  --  3.2* 4.4 3.3*  CL 91* 88*  --  92* 92* 88*  CO2 27 25  --  '26 25 25  '$ GLUCOSE 128* 250*  --  165* 148* 152*  BUN 13 21  --  23 32* 22  CREATININE 5.19* 6.42*  --  6.91* 8.10* 5.14*  CALCIUM 10.2 9.7  --  9.2 9.6 9.4  AST 15 19  --  '15 15 15  '$ ALT 11 9  --  '9 10 10  '$ ALKPHOS 84 93  --  86 86 73  BILITOT 0.6 0.6  --  0.6 0.6 0.5  ALBUMIN 3.4* 2.7*  --  2.3* 2.2* 2.2*  MG  --   --  1.8  --  2.1 2.0  CRP  --   --  20.4*  --   --   --   PROCALCITON  --   --  2.03 1.62 2.02  --   LATICACIDVEN  --   --  1.1  --   --   --   INR  --   --   --  1.0  --   --   TSH  --   --  0.760  --   --   --   HGBA1C  --   --  6.2*  --   --   --   BNP  --   --  6,945.0*  --   --   --     ------------------------------------------------------------------------------------------------------------------ No results for input(s): "CHOL", "HDL", "LDLCALC", "TRIG", "CHOLHDL", "LDLDIRECT" in the last  72 hours.  Lab Results  Component Value Date   HGBA1C 6.2 (H) 08/03/2022   ------------------------------------------------------------------------------------------------------------------ No results for input(s): "TSH", "T4TOTAL", "T3FREE", "THYROIDAB" in the last 72 hours.  Invalid input(s): "FREET3"   Cardiac Enzymes No results for  input(s): "CKMB", "TROPONINI", "MYOGLOBIN" in the last 168 hours.  Invalid input(s): "CK" ------------------------------------------------------------------------------------------------------------------    Component Value Date/Time   BNP 1,676.4 (H) 08/03/2022 0008    CBG: Recent Labs  Lab 08/05/22 1236 08/05/22 1649 08/05/22 2144 08/06/22 0846 08/06/22 1137  GLUCAP 141* 150* 162* 194* 171*    Recent Results (from the past 240 hour(s))  Aerobic/Anaerobic Culture w Gram Stain (surgical/deep wound)     Status: None (Preliminary result)   Collection Time: 08/03/22  1:26 PM   Specimen: Wound; Abscess  Result Value Ref Range Status   Specimen Description WOUND  Final   Special Requests  ABCESS ABDOMEN  Final   Gram Stain   Final    ABUNDANT WBC PRESENT, PREDOMINANTLY PMN ABUNDANT GRAM NEGATIVE RODS FEW GRAM POSITIVE COCCI IN PAIRS IN CHAINS FEW GRAM POSITIVE RODS    Culture   Final    ABUNDANT ESCHERICHIA COLI MODERATE KLEBSIELLA PNEUMONIAE SUSCEPTIBILITIES TO FOLLOW HOLDING FOR POSSIBLE ANAEROBE Performed at [facility], [place] [address], [place], [place] [address]    Report Status PENDING  Incomplete   Organism ID, Bacteria ESCHERICHIA COLI  Final      Susceptibility   Escherichia coli - MIC*    AMPICILLIN >=32 RESISTANT Resistant     CEFAZOLIN <=4 SENSITIVE Sensitive     CEFEPIME <=0.12 SENSITIVE Sensitive     CEFTAZIDIME <=1 SENSITIVE Sensitive     CEFTRIAXONE <=0.25 SENSITIVE Sensitive     CIPROFLOXACIN <=0.25 SENSITIVE Sensitive     GENTAMICIN <=1 SENSITIVE Sensitive     IMIPENEM <=0.25 SENSITIVE Sensitive     TRIMETH/SULFA <=20 SENSITIVE Sensitive     AMPICILLIN/SULBACTAM >=32 RESISTANT Resistant     PIP/TAZO <=4 SENSITIVE Sensitive     * ABUNDANT ESCHERICHIA COLI     Radiology Studies: No results found.   Phillips Climes MD [facility]  Between 7 am - 7 pm I am available, please contact me via Amion (for emergencies) or Securechat  (non urgent messages)  Between 7 pm - 7 am I am not available, please contact night coverage MD/APP via Amion

## 2022-08-06 NOTE — Care Management Important Message (Signed)
Important Message  Patient Details  Name: Destiny Day MRN: [id number] Date of Birth: 02-Jan-1951   Medicare Important Message Given:  Yes     Delane Wessinger Montine Circle 08/06/2022, 3:03 PM

## 2022-08-07 DIAGNOSIS — K3532 Acute appendicitis with perforation and localized peritonitis, without abscess: Secondary | ICD-10-CM

## 2022-08-07 DIAGNOSIS — K659 Peritonitis, unspecified: Secondary | ICD-10-CM | POA: Diagnosis not present

## 2022-08-07 LAB — RENAL FUNCTION PANEL
Albumin: 2.3 g/dL — ABNORMAL LOW (ref 3.5–5.0)
Anion gap: 19 — ABNORMAL HIGH (ref 5–15)
BUN: 36 mg/dL — ABNORMAL HIGH (ref 8–23)
CO2: 24 mmol/L (ref 22–32)
Calcium: 9.5 mg/dL (ref 8.9–10.3)
Chloride: 88 mmol/L — ABNORMAL LOW (ref 98–111)
Creatinine, Ser: 5.33 mg/dL — ABNORMAL HIGH (ref 0.44–1.00)
GFR, Estimated: 8 mL/min — ABNORMAL LOW (ref >60)
Glucose, Bld: 180 mg/dL — ABNORMAL HIGH (ref 70–99)
Phosphorus: 5 mg/dL — ABNORMAL HIGH (ref 2.5–4.6)
Potassium: 3.1 mmol/L — ABNORMAL LOW (ref 3.5–5.1)
Sodium: 131 mmol/L — ABNORMAL LOW (ref 135–145)

## 2022-08-07 LAB — CBC
HCT: 36.6 % (ref 36.0–46.0)
Hemoglobin: 11.5 g/dL — ABNORMAL LOW (ref 12.0–15.0)
MCH: 31.3 pg (ref 26.0–34.0)
MCHC: 31.4 g/dL (ref 30.0–36.0)
MCV: 99.7 fL (ref 80.0–100.0)
Platelets: 420 10*3/uL — ABNORMAL HIGH (ref 150–400)
RBC: 3.67 MIL/uL — ABNORMAL LOW (ref 3.87–5.11)
RDW: 13.3 % (ref 11.5–15.5)
WBC: 20.9 10*3/uL — ABNORMAL HIGH (ref 4.0–10.5)
nRBC: 0 % (ref 0.0–0.2)

## 2022-08-07 LAB — GLUCOSE, CAPILLARY
Glucose-Capillary: 181 mg/dL — ABNORMAL HIGH (ref 70–99)
Glucose-Capillary: 108 mg/dL — ABNORMAL HIGH (ref 70–99)
Glucose-Capillary: 273 mg/dL — ABNORMAL HIGH (ref 70–99)
Glucose-Capillary: 297 mg/dL — ABNORMAL HIGH (ref 70–99)

## 2022-08-07 MED ORDER — PENTAFLUOROPROP-TETRAFLUOROETH EX AERO
INHALATION_SPRAY | CUTANEOUS | Status: AC
  Filled 2022-08-07: qty 30

## 2022-08-07 MED ORDER — GABAPENTIN 100 MG PO CAPS
100.0000 mg | ORAL_CAPSULE | Freq: Every day | ORAL | Status: DC
  Administered 2022-08-07 – 2022-08-09 (×3): 100 mg via ORAL
  Filled 2022-08-07 (×3): qty 1

## 2022-08-07 MED ORDER — RISAQUAD PO CAPS
1.0000 | ORAL_CAPSULE | Freq: Every day | ORAL | Status: DC
  Administered 2022-08-07 – 2022-08-26 (×16): 1 via ORAL
  Filled 2022-08-07 (×16): qty 1

## 2022-08-07 NOTE — TOC Initial Note (Addendum)
Transition of Care [facility]) - Initial/Assessment Note    Patient Details  Name: Destiny Day MRN: [id number] Date of Birth: October 05, 1950  Transition of Care [facility]) CM/SW Contact:    Verdell Carmine, RN Phone Number: 08/07/2022, 2:41 PM  Clinical Narrative:                  72 Yo CKD dialysis patient with ruptured appendix and abscess on IV abx Day 5 . Abscess was drained by IR, still has JP.  PT order placed for evaluation. May need home health for reconditioning. Has family able to assist. Renal navigator /CSW is following.  Darby spoke to patient and husband. They are requesting home health aide and wheelchair. D/W MD order in for wheelchair. [place] orders not placed yet, will be in hospital several more days. PING Bamboo states patient active with [facility]. Called Amy to confirm.Awaiting PT assessment for recommendations.  Amy called back, the patient is active with [facility].  Cm will continue to follow for needs, recommendations, and transitions of care   Expected Discharge Plan: [place] Barriers to Discharge: Continued Medical Work up   Patient Goals and CMS Choice    Home with home health        Expected Discharge Plan and [place] with DME, possibly home health Living arrangements for the past 2 months: [place]                                      Prior Living Arrangements/Services Living arrangements for the past 2 months: Single Family Home Lives with:: Spouse Patient language and need for interpreter reviewed:: Yes        Need for Family Participation in Patient Care: Yes (Comment) Care giver support system in place?: Yes (comment)   Criminal Activity/Legal Involvement Pertinent to Current Situation/Hospitalization: No - Comment as needed  Activities of Daily Living Home Assistive Devices/Equipment: None ADL Screening (condition at time of admission) Patient's cognitive ability adequate to safely  complete daily activities?: Yes Is the patient deaf or have difficulty hearing?: Yes Does the patient have difficulty seeing, even when wearing glasses/contacts?: No Does the patient have difficulty concentrating, remembering, or making decisions?: No Patient able to express need for assistance with ADLs?: Yes Does the patient have difficulty dressing or bathing?: No Independently performs ADLs?: Yes (appropriate for developmental age) Does the patient have difficulty walking or climbing stairs?: No Weakness of Legs: Both Weakness of Arms/Hands: Both  Permission Sought/Granted                  Emotional Assessment       Orientation: : Oriented to Self, Oriented to Place Alcohol / Substance Use: Not Applicable Psych Involvement: No (comment)  Admission diagnosis:  Right sided abdominal pain [R10.9] Abdominal infection ([place]) [K65.9] Patient Active Problem List   Diagnosis Date Noted   Abdominal infection ([place]) 08/01/2022   Primary open angle glaucoma of left eye, mild stage 10/23/2021   ESRD (end stage renal disease) ([place]) 08/04/2021   Fluid overload 08/03/2021   Prolonged QT interval 08/03/2021   Acute on chronic heart failure ([place]) 01/05/2021   Macular pucker, right eye 10/29/2020   Nausea with vomiting, unspecified 09/10/2020   Pseudophakia of left eye 07/02/2020   Diabetic macular edema of right eye with proliferative retinopathy associated with type 2 diabetes mellitus (  [place]) 07/02/2020   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 type 2 diabetes mellitus ([place]) 04/23/2020   Allergy, unspecified, initial encounter 04/04/2020   Anaphylactic shock, unspecified, initial encounter 04/04/2020   Secondary hypertension, unspecified 12/13/2019   Other disorders of phosphorus metabolism 08/24/2019   Paroxysmal atrial fibrillation ([place]) 06/13/2019   Hypocalcemia 04/17/2019   Old bucket handle tear of lateral meniscus of right knee    Old  peripheral tear of medial meniscus of right knee    Staphylococcal arthritis, unspecified joint ([place]) 05/11/2018   Septic arthritis of knee, right ([place]) 05/06/2018   Degenerative tear of posterior horn of medial meniscus, right    Old complex tear of lateral meniscus of right knee    Loose body in knee, right    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[place]) 02/03/2018   End-stage renal disease on hemodialysis ([place])    Respiratory failure ([place]) 01/21/2018   Achilles tendon contracture, right 11/09/2017   Labile blood glucose    Anemia of infection and chronic disease    Black stool    Right sided weakness    Subdural hemorrhage following injury ([place]) 08/30/2017   Diabetic peripheral neuropathy (HCC)    Chronic obstructive pulmonary disease ([place])    Subdural hematoma ([place]) 08/24/2017   Traumatic subdural hematoma ([place]) 08/23/2017   Fall    SDH (subdural hematoma) ([place])    Acute respiratory failure with hypoxia ([place])    Diabetes mellitus type 2 in nonobese ([place])    Leukocytosis    Labile blood pressure    Generalized anxiety disorder    Debility 08/16/2017   Amputated great toe of right foot ([place])    Amputated toe of left foot ([place])    Post-operative pain    ESRD on dialysis [facility])    Anxiety state    Acute blood loss anemia    Diarrhea, unspecified 01/16/2017   Dependence on renal dialysis ([place]) 01/06/2017   Idiopathic chronic venous hypertension of both lower extremities with inflammation 10/13/2016   S/P transmetatarsal amputation of foot, left ([place]) 09/21/2016   Onychomycosis 08/15/2016   Unspecified protein-calorie malnutrition ([place]) 07/29/2016   Other mechanical complication of femoral arterial graft (bypass), initial encounter ([place]) 07/14/2016   Other pneumonia, unspecified organism 07/11/2016   Age-related osteoporosis without current pathological fracture 07/11/2016   Cervical disc disorder, unspecified, unspecified cervical region 07/11/2016    Diverticulitis of intestine, part unspecified, without perforation or abscess without bleeding 07/11/2016   Unspecified abdominal hernia without obstruction or gangrene 07/11/2016   Gastro-esophageal reflux disease with esophagitis 07/11/2016   Primary generalized (osteo)arthritis 07/11/2016   Peripheral vascular disease ([place]) 07/08/2016   Cellulitis of left toe 06/29/2016   Atherosclerosis of native artery of left lower extremity with gangrene ([place]) 20/25/4270   Complication of vascular dialysis catheter 02/29/2016   Encounter for screening for other viral diseases 01/10/2016   Encounter for removal of sutures 10/29/2015   Other fluid overload 10/23/2015   Anemia in chronic kidney disease 10/16/2015   Essential (primary) hypertension 10/16/2015   Acute pulmonary edema ([place]) 10/16/2015   Coagulation defect, unspecified ([place]) 10/16/2015   Acute respiratory failure ([place]) 10/16/2015   Hyperlipidemia, unspecified 10/16/2015   Iron deficiency anemia, unspecified 10/16/2015   Pain, unspecified 10/16/2015   Pruritus, unspecified 10/16/2015   Secondary hyperparathyroidism of renal origin ([place]) 10/16/2015   Shortness of breath 10/16/2015   GERD (gastroesophageal reflux disease) 10/07/2015   ARF (acute renal failure) ([place])    Hypothermia  06/12/2015   End stage renal disease ([place]) 06/12/2015   Unspecified diastolic (congestive) heart failure ([place]) 07/30/2014   CKD (chronic kidney disease) stage 4, GFR 15-29 ml/min (HCC) 06/27/2014   Hypertensive heart disease 05/25/2013   CKD (chronic kidney disease) stage 3, GFR 30-59 ml/min (HCC) 05/25/2013   Hypertensive urgency 05/23/2013   Pulmonary edema 05/23/2013   Type 2 diabetes mellitus with diabetic chronic kidney disease ([place]) 05/23/2013   Type 2 diabetes mellitus with hyperosmolar nonketotic hyperglycemia ([place]) 04/13/2013   Acute on chronic diastolic heart failure ([place]) 04/13/2013   Gastrointestinal hemorrhage, unspecified 03/31/2013    Hypokalemia 08/24/2012   Hyperlipidemia associated with type 2 diabetes mellitus ([place]) 12/27/2007   Allergic rhinitis due to pollen 12/27/2007   Unspecified asthma, uncomplicated 28/41/3244   PCP:  Cipriano Mile, NP Pharmacy:   [id number] [place], [id number]: [phone]: [phone] [facility] [place]Phone: [phone]: [phone] [address] [address]Phone: [phone]: [phone]    Social Determinants of Health (SDOH) Social History: SDOH Screenings   Food Insecurity: No Food Insecurity (08/04/2022)  Housing: Low Risk  (08/04/2022)  Transportation Needs: No Transportation Needs (08/04/2022)  Utilities: Not At Risk (08/04/2022)  Depression (PHQ2-9): Low Risk  (05/30/2020)  Tobacco Use: Medium Risk (08/23/2022)   SDOH Interventions:     Readmission Risk Interventions    08/04/2021    5:41 PM  Readmission Risk Prevention Plan  Transportation Screening Complete  Medication Review ([place] Complete  PCP or Specialist appointment within 3-5 days of discharge Complete  HRI or [place]Complete  SW Recovery Care/Counseling Consult Complete  PLa PresaNot Applicable

## 2022-08-07 NOTE — TOC Progression Note (Signed)
Transition of Care [facility]) - Progression Note    Patient Details  Name: Destiny Day MRN: [id number] Date of Birth: 1950/11/28  Transition of Care [facility]) CM/SW [place], [place] Phone Number: 08/07/2022, 4:28 PM  Clinical Narrative:    CSW received request from rounder to meet with patient regarding services at discharge. CSW met with spouse at bedside. He requested an aide at home. CSW explained that if patient qualifies for home health services we can add an aide onto the orders as long as the agency has staffing. He stated she had services before but could not remember the agency and that she was not currently active that he knew of. CSW explained that beyond the home health that Medicare covers, Medicaid is the only thing that pays for more hours of aide service. He also requested a wheelchair for patient. 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 CSW confirmed PCP and address with spouse. RNCM assisting with home needs as well.    Expected Discharge Plan: [place] Barriers to Discharge: Continued Medical Work up  Expected Discharge Plan and Services     Post Acute Care Choice: Home Health, Durable Medical Equipment Living arrangements for the past 2 months: [place] Determinants of Health (SDOH) Interventions SDOH Screenings   Food Insecurity: No Food Insecurity (08/04/2022)  Housing: Low Risk  (08/04/2022)  Transportation Needs: No Transportation Needs (08/04/2022)  Utilities: Not At Risk (08/04/2022)  Depression (PHQ2-9): Low Risk  (05/30/2020)  Tobacco Use: Medium Risk (08/13/2022)    Readmission Risk Interventions    08/07/2022    4:27 PM 08/04/2021    5:41 PM  Readmission Risk Prevention Plan  Transportation Screening Complete Complete  Medication Review Press photographer) Complete Complete  PCP or Specialist appointment within 3-5 days of  discharge Complete Complete  HRI or [place] Complete Complete  SW Recovery Care/Counseling Consult Complete Complete  Palliative Care Screening Not Applicable Not [place] Not Applicable Not Applicable

## 2022-08-07 NOTE — Progress Notes (Signed)
Received patient in bed to unit.  Alert and oriented.  Informed consent signed and in chart.   Treatment initiated: 0917 Treatment completed: 1259  Patient tolerated well.  Transported back to the room  Alert, without acute distress.  Hand-off given to patient's nurse.   Access used: AVG Access issues: none  Total UF removed: 1.2L Medication(s) given: Oxycodone Post HD VS: 97.9,161/87,17,100% Post HD weight: 58.2   [facility]

## 2022-08-07 NOTE — Progress Notes (Signed)
[facility] Progress Note     Subjective: Cc More alert today. Reports ongoing abdominal pain. Denies nausea. BMx3 documented last 24h  Objective: Vital signs in last 24 hours: Temp:  [97.9 F (36.6 C)-98.7 F (37.1 C)] 97.9 F (36.6 C) (01/12 0800) Pulse Rate:  [68-165] 68 (01/12 0800) Resp:  [10-31] 18 (01/12 0800) BP: (129-153)/(35-60) 147/60 (01/12 0800) SpO2:  [67 %-97 %] 92 % (01/12 0834) Last BM Date : 08/06/22  Intake/Output from previous day: 01/11 0701 - 01/12 0700 In: 400 [IV Piggyback:400] Out: 10 [Drains:10] Intake/Output this shift: No intake/output data recorded.  PE: Gen:  Alert, moaning at times Abd: Soft, RUQ abdominal pain with guarding - stable compared to yesterday, RLQ IR drain with thin brown effluent - low output.  Lab Results:  Recent Labs    08/06/22 0433 08/07/22 0627  WBC 20.2* 20.9*  HGB 12.0 11.5*  HCT 37.5 36.6  PLT 382 420*   BMET Recent Labs    08/05/22 0403 08/07/22 0627  NA 129* 131*  K 3.3* 3.1*  CL 88* 88*  CO2 25 24  GLUCOSE 152* 180*  BUN 22 36*  CREATININE 5.14* 5.33*  CALCIUM 9.4 9.5   PT/INR No results for input(s): "LABPROT", "INR" in the last 72 hours.  CMP     Component Value Date/Time   NA 131 (L) 08/07/2022 0627   NA 135 (A) 07/15/2016 0000   K 3.1 (L) 08/07/2022 0627   CL 88 (L) 08/07/2022 0627   CO2 24 08/07/2022 0627   GLUCOSE 180 (H) 08/07/2022 0627   BUN 36 (H) 08/07/2022 0627   BUN 30 (A) 07/15/2016 0000   CREATININE 5.33 (H) 08/07/2022 0627   CALCIUM 9.5 08/07/2022 0627   PROT 6.0 (L) 08/05/2022 0403   ALBUMIN 2.3 (L) 08/07/2022 0627   AST 15 08/05/2022 0403   ALT 10 08/05/2022 0403   ALKPHOS 73 08/05/2022 0403   BILITOT 0.5 08/05/2022 0403   GFRNONAA 8 (L) 08/07/2022 0627   GFRAA 6 (L) 06/26/2019 0500   Lipase     Component Value Date/Time   LIPASE 32 08/23/2022 1730    Studies/Results: CT ABDOMEN PELVIS W CONTRAST  Result Date: 08/06/2022 CLINICAL DATA:   Worsening right-sided abdominal pain. Status post right pelvic abscess drainage 3 days ago. EXAM: CT ABDOMEN AND PELVIS WITH CONTRAST TECHNIQUE: Multidetector CT imaging of the abdomen and pelvis was performed using the standard protocol following bolus administration of intravenous contrast. RADIATION DOSE REDUCTION: This exam was performed according to the departmental dose-optimization program which includes automated exposure control, adjustment of the mA and/or kV according to patient size and/or use of iterative reconstruction technique. CONTRAST:  76m OMNIPAQUE IOHEXOL 350 MG/ML SOLN COMPARISON:  CT abdomen pelvis dated August 02, 2022. FINDINGS: Lower chest: No acute abnormality. Hepatobiliary: No focal liver abnormality. Multiple tiny layering gallstones again noted. No gallbladder wall thickening or biliary dilatation. Pancreas: Unremarkable. No pancreatic ductal dilatation or surrounding inflammatory changes. Spleen: Normal in size without focal abnormality. Adrenals/Urinary Tract: Adrenal glands are unremarkable. Similar bilateral renal atrophy with delayed renal enhancement and contrast excretion, consistent with medical renal disease. Unchanged round 2.1 cm heterogeneously enhancing lesion in the upper pole of the left kidney. Extensive bilateral renovascular calcifications again noted. Small bilateral renal simple cysts are unchanged. No follow-up imaging is recommended. No hydronephrosis. The bladder is decompressed. Stomach/Bowel: Stomach is within normal limits. The small bowel and colon are unremarkable. The tip of the appendix is again seen terminating in the  right pelvic complex fluid collection (series 6, images 65-91), which is significantly decreased in size status post percutaneous drainage, with pigtail drainage catheter appropriately positioned. There are a few residual tiny foci of internal gas, but no significant residual fluid component. There are several loops of small bowel adjacent  to this area, but without obvious fistula or small-bowel inflammatory change. This area remains inseparable from the right ovary. Vascular/Lymphatic: Aortic atherosclerosis. No enlarged abdominal or pelvic lymph nodes. Reproductive: Prior hysterectomy and left oophorectomy. Unchanged 3.8 x 3.0 cm complex right ovarian mass with internal calcification. Other: No new fluid collection. No free fluid or pneumoperitoneum. Unchanged small fat containing right inguinal and femoral hernias. Musculoskeletal: No acute or significant osseous findings. IMPRESSION: 1. Significant interval decrease in size of the right pelvic abscess status post percutaneous drainage, with pigtail drainage catheter appropriately positioned. There are a few residual tiny foci of internal gas, but no significant residual fluid component. The tip of the appendix terminates within and is inseparable from this area, again suspicious for perforated appendicitis. There are several loops of small bowel adjacent to this area, but without obvious fistula or small-bowel inflammatory change. This area remains inseparable from the right ovary. 2. Unchanged 3.8 cm complex right ovarian mass with internal calcification, previously characterized on prior ultrasound. Gynecological consultation was recommended at that time. 3. Unchanged 2.1 cm heterogeneously enhancing left upper pole renal lesion, suspicious for primary renal neoplasm. Outpatient non-emergent follow-up renal protocol CT or MRI with and without contrast is recommended for further evaluation. 4. Unchanged cholelithiasis. 5.  Aortic Atherosclerosis (ICD10-I70.0). Electronically Signed   By: Titus Dubin M.D.   On: 08/06/2022 18:53    Anti-infectives: Anti-infectives (From admission, onward)    Start     Dose/Rate Route Frequency Ordered Stop   08/14/2022 2245  piperacillin-tazobactam (ZOSYN) IVPB 2.25 g        2.25 g 100 mL/hr over 30 Minutes Intravenous Every 8 hours 08/23/2022 2215      08/15/2022 1900  piperacillin-tazobactam (ZOSYN) IVPB 3.375 g        3.375 g 100 mL/hr over 30 Minutes Intravenous  Once 08/21/2022 1853 08/07/2022 2049        Assessment/Plan Pelvic abscess, likely perforated appendicitis   -VSS WBC 20 today, stable - CT abd pelvis repeat 1/11 due to leukocytosis, increased abdominal pain, and change in drain quality from cloudy SS to brown. CT was reassuring -- showed interval decrease in size of pelvic fluid collection that abuts the tip of the appendix. Drain in good position. No emergency surgery needs - continue drain and IV abx.  -Continue PRN antiemetics and monitor nausea  - Will need to continue her drain and follow up with IR in drain clinic as outpatient.  Will also need to follow up with Dr. Donne Hazel to discuss possible need for surgical intervention in the next couple of months.    FEN: advance to renal diet with fluid restriction 1259m ID: [id number] [email or web address]  VTE: SCD's Foley: none  ESRD CHF HTN Neuropathy HTN OSA    LOS: 5 days   I reviewed nursing notes, hospitalist notes, last 24 h vitals and pain scores, last 48 h intake and output, last 24 h labs and trends, and last 24 h imaging results.  EDarci Current[place] 8:50 AM 08/07/2022 Please refer to Amion for oncall pager numbers

## 2022-08-07 NOTE — Progress Notes (Signed)
PROGRESS NOTE  Destiny Day GDJ:[id number] DOB: 1951-07-01 DOA: 08/21/2022 PCP: Cipriano Mile, NP   LOS: 5 days   Brief Narrative / Interim history:  72 year old female with history of ESRD on MWF HD, asthma/COPD, diastolic CHF, HTN, HLD, DM2, comes into the hospital with few days of right lower quadrant abdominal pain.  Imaging in the ED concerning for perforated appendicitis with a periappendiceal abscess with probably secondary involvement of the previously noted adnexal mass or a tubo-ovarian abscess.  She was placed on antibiotics, general surgery was consulted and she was admitted to the hospital.  Subjective / 24h Interval events:  She does complain of bilateral lower extremity neuropathic pain  Assesement and Plan:  Intra-abdominal abscess, concern for perforated appendicitis  -patient started on antibiotics with Zosyn, continue.  General surgery consulted, IR consulted and she is status post right lower quadrant abscess drain placement.  Microbiology so far shows GNR, GPC in chains and GPR.  Continue Zosyn for now, tailor antibiotics based on speciation and sensitivities -Management per general surgery, CT abdomen pelvis obtained yesterday it is reassuring, plan to continue with IV antibiotics for now, with IR to manage drain, at 1 point she will need surgery when stable  Essential hypertension- continue home medications.  Hospital delirium -Much improved today, continue with delirium precaution  Neuropathic pain - she is complaining of bilateral extremity neuropathic pain so I will resume her back on gabapentin  ESRD on HD-nephrology consulted,HD per renal   Hyperlipidemia-resume home statin  Chronic diastolic CHF -appears euvolemic, volume management per HD  Anxiety-continue home BuSpar as needed  Asthma/COPD-stable, no wheezing, no exacerbation  Hypokalemia-replete and continue to monitor, potassium normalized this morning  Hyponatremia-fluid status managed  with HD  DM2-continue sliding scale.  CBG (last 3)  Recent Labs    08/06/22 2054 08/07/22 0832 08/07/22 1421  GLUCAP 177* 181* 108*    Scheduled Meds:  acidophilus  1 capsule Oral Daily   atorvastatin  40 mg Oral QHS   carvedilol  12.5 mg Oral Q M,W,F-HD   carvedilol  12.5 mg Oral 2 times per day on Tue Thu Sat   Chlorhexidine Gluconate Cloth  6 each Topical Q0600   Chlorhexidine Gluconate Cloth  6 each Topical Q0600   doxercalciferol  7 mcg Intravenous Q M,W,F-HD   ferric citrate  420 mg Oral TID WC   fluticasone furoate-vilanterol  1 puff Inhalation Daily   gabapentin  100 mg Oral QHS   heparin injection (subcutaneous)  5,000 Units Subcutaneous Q8H   insulin aspart  0-6 Units Subcutaneous TID WC   isosorbide-hydrALAZINE  1 tablet Oral 2 times per day on Sun Tue Thu Sat   isosorbide-hydrALAZINE  1 tablet Oral Q M,W,F-HD   ondansetron (ZOFRAN) IV  4 mg Intravenous Once   pantoprazole (PROTONIX) IV  40 mg Intravenous Q12H   pentafluoroprop-tetrafluoroeth       sodium chloride flush  5 mL Intracatheter Q8H   ursodiol  300 mg Oral BID   Continuous Infusions:  piperacillin-tazobactam 2.25 g (08/07/22 1450)   PRN Meds:.albuterol, busPIRone, fluticasone, hydrALAZINE, ondansetron (ZOFRAN) IV, ondansetron, oxyCODONE, pentafluoroprop-tetrafluoroeth, prochlorperazine, traZODone  Current Outpatient Medications  Medication Instructions   albuterol (VENTOLIN HFA) 108 (90 Base) MCG/ACT inhaler 2 puffs, Inhalation, Every 4 hours PRN   amLODipine (NORVASC) 10 mg, Oral, Daily   aspirin EC 81 mg, Oral, Daily   atorvastatin (LIPITOR) 40 mg, Oral, Daily at bedtime   b complex-vitamin c-folic acid (NEPHRO-VITE) 0.8 MG TABS tablet 1 tablet, Oral,  Daily at bedtime   budesonide-formoterol (SYMBICORT) 160-4.5 MCG/ACT inhaler 2 puffs, Inhalation, 2 times daily   busPIRone (BUSPAR) 7.5 mg, Oral, Daily PRN   carvedilol (COREG) 12.5 mg, Oral, See admin instructions, Take 1 tablet (12.5 mg) by  mouth once daily in the evening on Mondays, Wednesdays, & Fridays. (Dialysis)<BR>Take 1 tablet (12.5 mg) by mouth twice daily on 'Sundays, Tuesdays, Thursdays, & Saturdays. (Non Dialysis)   cinacalcet (SENSIPAR) 60 mg, Oral, See admin instructions, Take 1 tablet (60 mg) by mouth in the evening on Mondays, Wednesdays, & Fridays (dialysis)<BR>Take 1 tablet (60 mg) by mouth twice daily on Sundays, Tuesdays, Thursdays, Saturdays. (non dialysis)   diphenhydrAMINE HCl (BENADRYL ALLERGY PO) 1 capsule, Oral, Every M-W-F (Hemodialysis), If needed to help with allergies   EPINEPHrine (AUVI-Q) 0.3 mg, Intramuscular, As needed   ferric citrate (AURYXIA) 210-420 mg, Oral, See admin instructions, Take 2 tablets (420 mg) by mouth with each meal & take 1 tablet (210 mg) by mouth with snacks.    fluticasone (FLONASE) 50 MCG/ACT nasal spray 2 sprays, Each Nare, Daily PRN   fluticasone-salmeterol (ADVAIR HFA) 115-21 MCG/ACT inhaler Inhale two puffs twice daily   gabapentin (NEURONTIN) 100 mg, Oral, Daily at bedtime, When necessary for neuropathy pain   glipiZIDE (GLUCOTROL) 5 mg, Oral, Daily at bedtime   isosorbide-hydrALAZINE (BIDIL) 20-37.5 MG tablet 1 tablet, Oral, See admin instructions, hs on Monday,Wednesday,Friday (dialysis days)<BR>Tid on Tuesday,Thursday,Saturday,Sunday(non-dialysis days)   latanoprost (XALATAN) 0.005 % ophthalmic solution 1 drop, Left Eye, Daily at bedtime   levocetirizine (XYZAL) 5 mg, Oral, Every evening   lidocaine-prilocaine (EMLA) cream 1 Application, Topical, As needed   loperamide (IMODIUM) 4 mg, Oral, 3 times daily PRN   Methoxy PEG-Epoetin Beta (MIRCERA IJ) Mircera   montelukast (SINGULAIR) 10 MG tablet TAKE 1 TABLET BY MOUTH EVERYDAY AT BEDTIME   ofloxacin (OCUFLOX) 0.3 % ophthalmic solution 1 drop, 4 times daily   omeprazole (PRILOSEC) 20 mg, Oral, Daily PRN   ondansetron (ZOFRAN) 4 mg, Oral, Every 6 hours PRN   prednisoLONE acetate (PRED FORTE) 1 % ophthalmic suspension 1 drop,  4 times daily   promethazine-dextromethorphan (PROMETHAZINE-DM) 6.25-15 MG/5ML syrup 5 mLs, Oral, 4 times daily PRN   Spacer/Aero-Holding Chambers (AEROCHAMBER PLUS) inhaler Use with inhaler   traZODone (DESYREL) 25-50 mg, Oral, At bedtime PRN   ursodiol (ACTIGALL) 250 mg, Oral, 2 times daily    Diet Orders (From admission, onward)     Start     Ordered   08/07/22 0852  Diet renal with fluid restriction Fluid restriction: 1200 mL Fluid; Room service appropriate? Yes; Fluid consistency: Thin  Diet effective now       Question Answer Comment  Fluid restriction: 1200 mL Fluid   Room service appropriate? Yes   Fluid consistency: Thin      01'$ /12/24 0851            DVT prophylaxis: heparin injection 5,000 Units Start: 08/05/22 1445 SCDs Start: 08/18/2022 2213   Lab Results  Component Value Date   PLT 420 (H) 08/07/2022      Code Status: Full Code  Family Communication: D/W husband at bedside  Status is: Inpatient  Remains inpatient appropriate because: severity of illness  Level of care: Progressive  Consultants:  General surgery IR Nephrology   Objective: Vitals:   08/07/22 1130 08/07/22 1200 08/07/22 1259 08/07/22 1418  BP: 101/66 133/64 (!) 161/87 (!) 192/53  Pulse: 74 70 73   Resp: '15 12  20  '$ Temp:   97.9 F (  36.6 C) 97.9 F (36.6 C)  TempSrc:   Oral Oral  SpO2: 100% 97%  96%  Weight:      Height:        Intake/Output Summary (Last 24 hours) at 08/07/2022 1525 Last data filed at 08/07/2022 1259 Gross per 24 hour  Intake 400 ml  Output 1210 ml  Net -810 ml   Wt Readings from Last 3 Encounters:  08/07/22 59.2 kg  05/29/22 63.5 kg  02/19/22 61.7 kg    Examination:  Awake Alert, Oriented X 3, she is appropriate today Symmetrical Chest wall movement, Good air movement bilaterally, CTAB RRR,No Gallops,Rubs or new Murmurs, No Parasternal Heave +ve B.Sounds, tenderness in the right lower quadrant, drain present. No Cyanosis, Clubbing or edema, No  new Rash or bruise       Data Reviewed: I have independently reviewed following labs and imaging studies   CBC Recent Labs  Lab 08/17/2022 1730 08/03/22 0008 08/03/22 0422 08/04/22 0438 08/05/22 0403 08/06/22 0433 08/07/22 0627  WBC 19.6*  --  21.7* 24.8* 13.6* 20.2* 20.9*  HGB 11.6*   < > 10.9* 10.4* 15.2* 12.0 11.5*  HCT 36.5   < > 32.7* 32.2* 46.8* 37.5 36.6  PLT 243  --  264 288 196 382 420*  MCV 100.0  --  97.0 99.4 97.9 99.5 99.7  MCH 31.8  --  32.3 32.1 31.8 31.8 31.3  MCHC 31.8  --  33.3 32.3 32.5 32.0 31.4  RDW 12.9  --  13.0 13.2 13.2 13.3 13.3  LYMPHSABS 0.9  --   --   --   --   --   --   MONOABS 0.7  --   --   --   --   --   --   EOSABS 0.1  --   --   --   --   --   --   BASOSABS 0.0  --   --   --   --   --   --    < > = values in this interval not displayed.    Recent Labs  Lab 08/10/2022 1730 08/03/22 0008 08/03/22 0422 08/04/22 0438 08/05/22 0403 08/07/22 0627  NA 129*  --  130* 131* 129* 131*  K 3.3*  --  3.2* 4.4 3.3* 3.1*  CL 88*  --  92* 92* 88* 88*  CO2 25  --  '26 25 25 24  '$ GLUCOSE 250*  --  165* 148* 152* 180*  BUN 21  --  23 32* 22 36*  CREATININE 6.42*  --  6.91* 8.10* 5.14* 5.33*  CALCIUM 9.7  --  9.2 9.6 9.4 9.5  AST 19  --  '15 15 15  '$ --   ALT 9  --  '9 10 10  '$ --   ALKPHOS 93  --  86 86 73  --   BILITOT 0.6  --  0.6 0.6 0.5  --   ALBUMIN 2.7*  --  2.3* 2.2* 2.2* 2.3*  MG  --  1.8  --  2.1 2.0  --   CRP  --  20.4*  --   --   --   --   PROCALCITON  --  2.03 1.62 2.02  --   --   LATICACIDVEN  --  1.1  --   --   --   --   INR  --   --  1.0  --   --   --   TSH  --  0.760  --   --   --   --   HGBA1C  --  6.2*  --   --   --   --   BNP  --  1,676.4*  --   --   --   --     ------------------------------------------------------------------------------------------------------------------ No results for input(s): "CHOL", "HDL", "LDLCALC", "TRIG", "CHOLHDL", "LDLDIRECT" in the last 72 hours.  Lab Results  Component Value Date   HGBA1C 6.2 (H)  08/03/2022   ------------------------------------------------------------------------------------------------------------------ No results for input(s): "TSH", "T4TOTAL", "T3FREE", "THYROIDAB" in the last 72 hours.  Invalid input(s): "FREET3"   Cardiac Enzymes No results for input(s): "CKMB", "TROPONINI", "MYOGLOBIN" in the last 168 hours.  Invalid input(s): "CK" ------------------------------------------------------------------------------------------------------------------    Component Value Date/Time   BNP 1,676.4 (H) 08/03/2022 0008    CBG: Recent Labs  Lab 08/06/22 1137 08/06/22 1728 08/06/22 2054 08/07/22 0832 08/07/22 1421  GLUCAP 171* 186* 177* 181* 108*    Recent Results (from the past 240 hour(s))  Aerobic/Anaerobic Culture w Gram Stain (surgical/deep wound)     Status: None (Preliminary result)   Collection Time: 08/03/22  1:26 PM   Specimen: Wound; Abscess  Result Value Ref Range Status   Specimen Description WOUND  Final   Special Requests  ABCESS ABDOMEN  Final   Gram Stain   Final    ABUNDANT WBC PRESENT, PREDOMINANTLY PMN ABUNDANT GRAM NEGATIVE RODS FEW GRAM POSITIVE COCCI IN PAIRS IN CHAINS FEW GRAM POSITIVE RODS    Culture   Final    ABUNDANT ESCHERICHIA COLI MODERATE KLEBSIELLA PNEUMONIAE ABUNDANT BACTEROIDES THETAIOTAOMICRON BETA LACTAMASE POSITIVE Performed at [facility], [place] [address] [place], [place] [address]    Report Status PENDING  Incomplete   Organism ID, Bacteria ESCHERICHIA COLI  Final      Susceptibility   Escherichia coli - MIC*    AMPICILLIN >=32 RESISTANT Resistant     CEFAZOLIN <=4 SENSITIVE Sensitive     CEFEPIME <=0.12 SENSITIVE Sensitive     CEFTAZIDIME <=1 SENSITIVE Sensitive     CEFTRIAXONE <=0.25 SENSITIVE Sensitive     CIPROFLOXACIN <=0.25 SENSITIVE Sensitive     GENTAMICIN <=1 SENSITIVE Sensitive     IMIPENEM <=0.25 SENSITIVE Sensitive     TRIMETH/SULFA <=20 SENSITIVE Sensitive      AMPICILLIN/SULBACTAM >=32 RESISTANT Resistant     PIP/TAZO <=4 SENSITIVE Sensitive     * ABUNDANT ESCHERICHIA COLI     Radiology Studies: CT ABDOMEN PELVIS W CONTRAST  Result Date: 08/06/2022 CLINICAL DATA:  Worsening right-sided abdominal pain. Status post right pelvic abscess drainage 3 days ago. EXAM: CT ABDOMEN AND PELVIS WITH CONTRAST TECHNIQUE: Multidetector CT imaging of the abdomen and pelvis was performed using the standard protocol following bolus administration of intravenous contrast. RADIATION DOSE REDUCTION: This exam was performed according to the departmental dose-optimization program which includes automated exposure control, adjustment of the mA and/or kV according to patient size and/or use of iterative reconstruction technique. CONTRAST:  56m OMNIPAQUE IOHEXOL 350 MG/ML SOLN COMPARISON:  CT abdomen pelvis dated August 02, 2022. FINDINGS: Lower chest: No acute abnormality. Hepatobiliary: No focal liver abnormality. Multiple tiny layering gallstones again noted. No gallbladder wall thickening or biliary dilatation. Pancreas: Unremarkable. No pancreatic ductal dilatation or surrounding inflammatory changes. Spleen: Normal in size without focal abnormality. Adrenals/Urinary Tract: Adrenal glands are unremarkable. Similar bilateral renal atrophy with delayed renal enhancement and contrast excretion, consistent with medical renal disease. Unchanged round 2.1 cm heterogeneously enhancing lesion in the upper pole of the left  kidney. Extensive bilateral renovascular calcifications again noted. Small bilateral renal simple cysts are unchanged. No follow-up imaging is recommended. No hydronephrosis. The bladder is decompressed. Stomach/Bowel: Stomach is within normal limits. The small bowel and colon are unremarkable. The tip of the appendix is again seen terminating in the right pelvic complex fluid collection (series 6, images 65-91), which is significantly decreased in size status post  percutaneous drainage, with pigtail drainage catheter appropriately positioned. There are a few residual tiny foci of internal gas, but no significant residual fluid component. There are several loops of small bowel adjacent to this area, but without obvious fistula or small-bowel inflammatory change. This area remains inseparable from the right ovary. Vascular/Lymphatic: Aortic atherosclerosis. No enlarged abdominal or pelvic lymph nodes. Reproductive: Prior hysterectomy and left oophorectomy. Unchanged 3.8 x 3.0 cm complex right ovarian mass with internal calcification. Other: No new fluid collection. No free fluid or pneumoperitoneum. Unchanged small fat containing right inguinal and femoral hernias. Musculoskeletal: No acute or significant osseous findings. IMPRESSION: 1. Significant interval decrease in size of the right pelvic abscess status post percutaneous drainage, with pigtail drainage catheter appropriately positioned. There are a few residual tiny foci of internal gas, but no significant residual fluid component. The tip of the appendix terminates within and is inseparable from this area, again suspicious for perforated appendicitis. There are several loops of small bowel adjacent to this area, but without obvious fistula or small-bowel inflammatory change. This area remains inseparable from the right ovary. 2. Unchanged 3.8 cm complex right ovarian mass with internal calcification, previously characterized on prior ultrasound. Gynecological consultation was recommended at that time. 3. Unchanged 2.1 cm heterogeneously enhancing left upper pole renal lesion, suspicious for primary renal neoplasm. Outpatient non-emergent follow-up renal protocol CT or MRI with and without contrast is recommended for further evaluation. 4. Unchanged cholelithiasis. 5.  Aortic Atherosclerosis (ICD10-I70.0). Electronically Signed   By: Titus Dubin M.D.   On: 08/06/2022 18:53     Lorenia Hoston MD [facility]  Between 7 am - 7 pm I am available, please contact me via [place] (for emergencies) or Securechat (non urgent messages)  Between 7 pm - 7 am I am not available, please contact night coverage MD/APP via Amion

## 2022-08-07 NOTE — Progress Notes (Signed)
[facility] Progress Note   Subjective:    Seen on examined on HD. Tolerating UFG 1.5L. SBP in 140s. Denies SOB, CP, and N/V. Noted ABD drain.  Objective Vitals:   08/07/22 0332 08/07/22 0800 08/07/22 0834 08/07/22 0903  BP: (!) 146/47 (!) 147/60  (!) 160/42  Pulse: 68 68    Resp: 16 18    Temp: 98.7 F (37.1 C) 97.9 F (36.6 C)  97.6 F (36.4 C)  TempSrc: Oral Oral  Oral  SpO2:   92%   Weight:    59.2 kg  Height:       Physical Exam General: Thin chronically ill elderly female Heart: RRR no MRG Lungs: CTA bilaterally nonlabored breathing Abdomen: NABS, soft, non-distended, mildly tender with palpation; RLQ drain placed in IR- dark fluid Extremities: No pedal edema Dialysis Access: LUA AVG+ bruit  Filed Weights   08/05/22 0727 08/05/22 1149 08/07/22 0903  Weight: 59.5 kg 57.5 kg 59.2 kg    Intake/Output Summary (Last 24 hours) at 08/07/2022 1011 Last data filed at 08/07/2022 0658 Gross per 24 hour  Intake 400 ml  Output 10 ml  Net 390 ml    Additional Objective Labs: Basic Metabolic Panel: Recent Labs  Lab 08/04/22 0438 08/05/22 0403 08/07/22 0627  NA 131* 129* 131*  K 4.4 3.3* 3.1*  CL 92* 88* 88*  CO2 '25 25 24  '$ GLUCOSE 148* 152* 180*  BUN 32* 22 36*  CREATININE 8.10* 5.14* 5.33*  CALCIUM 9.6 9.4 9.5  PHOS 6.6*  --  5.0*   Liver Function Tests: Recent Labs  Lab 08/03/22 0422 08/04/22 0438 08/05/22 0403 08/07/22 0627  AST '15 15 15  '$ --   ALT '9 10 10  '$ --   ALKPHOS 86 86 73  --   BILITOT 0.6 0.6 0.5  --   PROT 5.9* 6.2* 6.0*  --   ALBUMIN 2.3* 2.2* 2.2* 2.3*   Recent Labs  Lab 08/16/2022 1730  LIPASE 32   CBC: Recent Labs  Lab 07/30/2022 1730 08/03/22 0008 08/03/22 0422 08/04/22 0438 08/05/22 0403 08/06/22 0433 08/07/22 0627  WBC 19.6*  --  21.7* 24.8* 13.6* 20.2* 20.9*  NEUTROABS 17.7*  --   --   --   --   --   --   HGB 11.6*   < > 10.9* 10.4* 15.2* 12.0 11.5*  HCT 36.5   < > 32.7* 32.2* 46.8* 37.5 36.6  MCV 100.0   --  97.0 99.4 97.9 99.5 99.7  PLT 243  --  264 288 196 382 420*   < > = values in this interval not displayed.   Blood Culture    Component Value Date/Time   SDES WOUND 08/03/2022 1326   SPECREQUEST  ABCESS ABDOMEN 08/03/2022 1326   CULT  08/03/2022 1326    ABUNDANT ESCHERICHIA COLI MODERATE KLEBSIELLA PNEUMONIAE SUSCEPTIBILITIES TO FOLLOW ABUNDANT BACTEROIDES THETAIOTAOMICRON BETA LACTAMASE POSITIVE Performed at [facility], [place] [address], [place], [place] [address]    REPTSTATUS PENDING 08/03/2022 1326    Cardiac Enzymes: No results for input(s): "CKTOTAL", "CKMB", "CKMBINDEX", "TROPONINI" in the last 168 hours. CBG: Recent Labs  Lab 08/06/22 0846 08/06/22 1137 08/06/22 1728 08/06/22 2054 08/07/22 0832  GLUCAP 194* 171* 186* 177* 181*   Iron Studies: No results for input(s): "IRON", "TIBC", "TRANSFERRIN", "FERRITIN" in the last 72 hours. Lab Results  Component Value Date   INR 1.0 08/03/2022   INR 1.1 05/17/2019   INR 1.02 08/23/2017   Studies/Results: CT  ABDOMEN PELVIS W CONTRAST  Result Date: 08/06/2022 CLINICAL DATA:  Worsening right-sided abdominal pain. Status post right pelvic abscess drainage 3 days ago. EXAM: CT ABDOMEN AND PELVIS WITH CONTRAST TECHNIQUE: Multidetector CT imaging of the abdomen and pelvis was performed using the standard protocol following bolus administration of intravenous contrast. RADIATION DOSE REDUCTION: This exam was performed according to the departmental dose-optimization program which includes automated exposure control, adjustment of the mA and/or kV according to patient size and/or use of iterative reconstruction technique. CONTRAST:  61m OMNIPAQUE IOHEXOL 350 MG/ML SOLN COMPARISON:  CT abdomen pelvis dated August 02, 2022. FINDINGS: Lower chest: No acute abnormality. Hepatobiliary: No focal liver abnormality. Multiple tiny layering gallstones again noted. No gallbladder wall thickening or biliary dilatation. Pancreas:  Unremarkable. No pancreatic ductal dilatation or surrounding inflammatory changes. Spleen: Normal in size without focal abnormality. Adrenals/Urinary Tract: Adrenal glands are unremarkable. Similar bilateral renal atrophy with delayed renal enhancement and contrast excretion, consistent with medical renal disease. Unchanged round 2.1 cm heterogeneously enhancing lesion in the upper pole of the left kidney. Extensive bilateral renovascular calcifications again noted. Small bilateral renal simple cysts are unchanged. No follow-up imaging is recommended. No hydronephrosis. The bladder is decompressed. Stomach/Bowel: Stomach is within normal limits. The small bowel and colon are unremarkable. The tip of the appendix is again seen terminating in the right pelvic complex fluid collection (series 6, images 65-91), which is significantly decreased in size status post percutaneous drainage, with pigtail drainage catheter appropriately positioned. There are a few residual tiny foci of internal gas, but no significant residual fluid component. There are several loops of small bowel adjacent to this area, but without obvious fistula or small-bowel inflammatory change. This area remains inseparable from the right ovary. Vascular/Lymphatic: Aortic atherosclerosis. No enlarged abdominal or pelvic lymph nodes. Reproductive: Prior hysterectomy and left oophorectomy. Unchanged 3.8 x 3.0 cm complex right ovarian mass with internal calcification. Other: No new fluid collection. No free fluid or pneumoperitoneum. Unchanged small fat containing right inguinal and femoral hernias. Musculoskeletal: No acute or significant osseous findings. IMPRESSION: 1. Significant interval decrease in size of the right pelvic abscess status post percutaneous drainage, with pigtail drainage catheter appropriately positioned. There are a few residual tiny foci of internal gas, but no significant residual fluid component. The tip of the appendix terminates  within and is inseparable from this area, again suspicious for perforated appendicitis. There are several loops of small bowel adjacent to this area, but without obvious fistula or small-bowel inflammatory change. This area remains inseparable from the right ovary. 2. Unchanged 3.8 cm complex right ovarian mass with internal calcification, previously characterized on prior ultrasound. Gynecological consultation was recommended at that time. 3. Unchanged 2.1 cm heterogeneously enhancing left upper pole renal lesion, suspicious for primary renal neoplasm. Outpatient non-emergent follow-up renal protocol CT or MRI with and without contrast is recommended for further evaluation. 4. Unchanged cholelithiasis. 5.  Aortic Atherosclerosis (ICD10-I70.0). Electronically Signed   By: WTitus DubinM.D.   On: 08/06/2022 18:53    Medications:  piperacillin-tazobactam 2.25 g (08/07/22 0527)    atorvastatin  40 mg Oral QHS   carvedilol  12.5 mg Oral Q M,W,F-HD   carvedilol  12.5 mg Oral 2 times per day on Tue Thu Sat   Chlorhexidine Gluconate Cloth  6 each Topical Q0600   Chlorhexidine Gluconate Cloth  6 each Topical Q0600   doxercalciferol  7 mcg Intravenous Q M,W,F-HD   ferric citrate  420 mg Oral TID WC   fluticasone furoate-vilanterol  1 puff Inhalation Daily   heparin injection (subcutaneous)  5,000 Units Subcutaneous Q8H   insulin aspart  0-6 Units Subcutaneous TID WC   isosorbide-hydrALAZINE  1 tablet Oral 2 times per day on Sun Tue Thu Sat   isosorbide-hydrALAZINE  1 tablet Oral Q M,W,F-HD   ondansetron (ZOFRAN) IV  4 mg Intravenous Once   pantoprazole (PROTONIX) IV  40 mg Intravenous Q12H   pentafluoroprop-tetrafluoroeth       sodium chloride flush  5 mL Intracatheter Q8H   ursodiol  300 mg Oral BID    Dialysis Orders: [facility] MWF  4h  400/600   57.4kg   2/3.5 bath LUA AVG  Heparin 2500+ 1057mdrun - last HD 1/5, post wt 57.3kg - hectorol 7 ug IV tiw - venofer 50 q week - mircera 30 ug q4 wks,  last 12/8, due now    CXR 1/09 - no acute process  Assessment/Plan: Abd pain/ intra-abdominal infection or abscess - per CT imaging. Gen surg and IR consulted, started on IV abx. IR placed drain in RLQ fluid collection. Fluid sent for culture, growing GNR's. Per pmd. Per general surgery, repeat CT showed interval decrease in size of pelvic fluid collection and drain in good position. No emergent surgery is needed at this time. ESRD - on HD MWF. On HD: On 4K bath for K+ 3.1. Monitor trend closely. Will give K+ supplementation if needed. Checking labs in AM.  HTN/ volume - no edema on exam, CXR neg, 2kg up today. UFG 1.5L today. 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

## 2022-08-07 NOTE — Progress Notes (Signed)
Ms. Ditton is alert and oriented x3. Flushed JP drain with 5 ml of NS. No resistance met, no complaints of pain during intervention. Drainage from JP drain appears then brownish-green in color.

## 2022-08-08 DIAGNOSIS — K3533 Acute appendicitis with perforation and localized peritonitis, with abscess: Secondary | ICD-10-CM

## 2022-08-08 DIAGNOSIS — K659 Peritonitis, unspecified: Secondary | ICD-10-CM | POA: Diagnosis not present

## 2022-08-08 DIAGNOSIS — K651 Peritoneal abscess: Secondary | ICD-10-CM | POA: Diagnosis not present

## 2022-08-08 DIAGNOSIS — R209 Unspecified disturbances of skin sensation: Secondary | ICD-10-CM | POA: Diagnosis not present

## 2022-08-08 DIAGNOSIS — D72829 Elevated white blood cell count, unspecified: Secondary | ICD-10-CM | POA: Diagnosis not present

## 2022-08-08 DIAGNOSIS — M79606 Pain in leg, unspecified: Secondary | ICD-10-CM | POA: Diagnosis not present

## 2022-08-08 DIAGNOSIS — R197 Diarrhea, unspecified: Secondary | ICD-10-CM | POA: Diagnosis not present

## 2022-08-08 LAB — GLUCOSE, CAPILLARY
Glucose-Capillary: 248 mg/dL — ABNORMAL HIGH (ref 70–99)
Glucose-Capillary: 237 mg/dL — ABNORMAL HIGH (ref 70–99)
Glucose-Capillary: 221 mg/dL — ABNORMAL HIGH (ref 70–99)
Glucose-Capillary: 140 mg/dL — ABNORMAL HIGH (ref 70–99)

## 2022-08-08 LAB — CBC
HCT: 34.2 % — ABNORMAL LOW (ref 36.0–46.0)
Hemoglobin: 11.5 g/dL — ABNORMAL LOW (ref 12.0–15.0)
MCH: 32.2 pg (ref 26.0–34.0)
MCHC: 33.6 g/dL (ref 30.0–36.0)
MCV: 95.8 fL (ref 80.0–100.0)
Platelets: 365 10*3/uL (ref 150–400)
RBC: 3.57 MIL/uL — ABNORMAL LOW (ref 3.87–5.11)
RDW: 13.2 % (ref 11.5–15.5)
WBC: 24.8 10*3/uL — ABNORMAL HIGH (ref 4.0–10.5)
nRBC: 0.2 % (ref 0.0–0.2)

## 2022-08-08 LAB — AEROBIC/ANAEROBIC CULTURE W GRAM STAIN (SURGICAL/DEEP WOUND)

## 2022-08-08 MED ORDER — CEFAZOLIN SODIUM-DEXTROSE 1-4 GM/50ML-% IV SOLN
1.0000 g | Freq: Every day | INTRAVENOUS | Status: DC
  Filled 2022-08-08: qty 50

## 2022-08-08 MED ORDER — PIPERACILLIN-TAZOBACTAM IN DEX 2-0.25 GM/50ML IV SOLN
2.2500 g | Freq: Three times a day (TID) | INTRAVENOUS | Status: DC
  Administered 2022-08-08 – 2022-08-10 (×6): 2.25 g via INTRAVENOUS
  Filled 2022-08-08 (×7): qty 50

## 2022-08-08 MED ORDER — PANTOPRAZOLE SODIUM 40 MG PO TBEC
40.0000 mg | DELAYED_RELEASE_TABLET | Freq: Two times a day (BID) | ORAL | Status: DC
  Administered 2022-08-08 – 2022-08-26 (×32): 40 mg via ORAL
  Filled 2022-08-08 (×32): qty 1

## 2022-08-08 NOTE — Evaluation (Signed)
Physical Therapy Evaluation Patient Details Name: Destiny Day MRN: [id number] DOB: February 26, 1951 Today's Date: 08/08/2022  History of Present Illness  72 year old female admitted 1/7  to the hospital with few days of right lower quadrant abdominal pain.  Imaging in the ED concerning for perforated appendicitis with a periappendiceal abscess with probably secondary involvement of the previously noted adnexal mass or a tubo-ovarian abscess.  She was placed on antibiotics, general surgery was consulted and she was admitted to the hospital. JP drain right lower quadrant placed.  PMH: ESRD on MWF HD, asthma/COPD, diastolic CHF, HTN, HLD, DM2  Clinical Impression  Pt admitted with above diagnosis. Pt unable to participate much with PT as she was lethargic throughout evaluation. Nurse in room on arrival getting ready to get pt back to bed. PT assessed pt and she could only follow commands inconsistently. Pt had been sitting up since 9 am per husband but he states this is very different from her baseline. PT assisted her with total assist back to bed and notified MD of her vital signs with movement (unremarkable) as well as regarding her mobility and lethargy.  Will follow acutely and progress pt as able.  Pt currently with functional limitations due to the deficits listed below (see PT Problem List). Pt will benefit from skilled PT to increase their independence and safety with mobility to allow discharge to the venue listed below.          Recommendations for follow up therapy are one component of a multi-disciplinary discharge planning process, led by the attending physician.  Recommendations may be updated based on patient status, additional functional criteria and insurance authorization.  Follow Up Recommendations Home health PT (HHaide, HHOT)      Assistance Recommended at Discharge Intermittent Supervision/Assistance  Patient can return home with the following  A little help with walking and/or  transfers;A little help with bathing/dressing/bathroom;Assistance with cooking/housework;Assist for transportation;Help with stairs or ramp for entrance    Equipment Recommendations Rolling walker (2 wheels);Wheelchair (measurements PT);Wheelchair cushion (measurements PT)  Recommendations for Other Services       Functional Status Assessment Patient has had a recent decline in their functional status and demonstrates the ability to make significant improvements in function in a reasonable and predictable amount of time.     Precautions / Restrictions Precautions Precautions: Fall Precaution Comments: JP drain right lower quadrant Required Braces or Orthoses: Other Brace Other Brace: Pt has bil shoes with metal upright braces Restrictions Weight Bearing Restrictions: No      Mobility  Bed Mobility Overal bed mobility: Needs Assistance Bed Mobility: Sit to Supine       Sit to supine: Total assist   General bed mobility comments: Pt not following commands to lie down therefore total assist to get pt back in bed    Transfers Overall transfer level: Needs assistance Equipment used: Rolling walker (2 wheels), 2 person hand held assist Transfers: Sit to/from Stand, Bed to chair/wheelchair/BSC Sit to Stand: Total assist (attempted with pt unable to attempt to stand to RW)     Squat pivot transfers: Max assist, Total assist     General transfer comment: Attempted to stand pt to RW and pt would not assist. Pt too lethargic to follow commands and nursing agreed for PT to assist pt back to bed. Pt did weight bear on bil LEs but needed total to max assist to squat pivot pt back to bed.  Notified MD.    Ambulation/Gait  Stairs            Wheelchair Mobility    Modified Rankin (Stroke Patients Only)       Balance Overall balance assessment: Needs assistance Sitting-balance support: Bilateral upper extremity supported, No upper extremity  supported, Feet supported Sitting balance-Leahy Scale: Poor Sitting balance - Comments: relies on external and UE support                                     Pertinent Vitals/Pain Pain Assessment Pain Assessment: No/denies pain    Home Living Family/patient expects to be discharged to:: Private residence Living Arrangements: Spouse/significant other Available Help at Discharge: Family;Available PRN/intermittently (husband works full time) Type of Home: House Home Access: Level entry       Home Layout: Two level;Able to live on main level with bedroom/bathroom Home Equipment: Rolling Walker (2 wheels);Rollator (4 wheels);Cane - single point;Crutches;Wheelchair - manual Additional Comments: Drives to end of driveway and Lucianne Lei picks her up for HD; watches TV and may make her own bfast and wash clothes    Prior Function               Mobility Comments: walks in house with RW on her own; able to put her braces on by herslef PTA ADLs Comments: sponge bathes to make it easier, husband cooks and cleans     Hand Dominance   Dominant Hand: Right    Extremity/Trunk Assessment   Upper Extremity Assessment Upper Extremity Assessment: Defer to OT evaluation    Lower Extremity Assessment Lower Extremity Assessment:  (Difficult to assess as pt not following commands)    Cervical / Trunk Assessment Cervical / Trunk Assessment: Kyphotic  Communication   Communication: No difficulties  Cognition Arousal/Alertness: Lethargic, Suspect due to medications Behavior During Therapy: Flat affect Overall Cognitive Status: Difficult to assess                                 General Comments: Pt not following commands consistently as she appeared to not be able to stay awake/alert        General Comments General comments (skin integrity, edema, etc.): 70 bpm, 97%RA, 132/50 ONCE IN BED; 106/36 IN CHAIR    Exercises     Assessment/Plan    PT Assessment  Patient needs continued PT services  PT Problem List Decreased activity tolerance;Decreased balance;Decreased mobility;Decreased knowledge of use of DME;Decreased safety awareness;Decreased knowledge of precautions       PT Treatment Interventions DME instruction;Gait training;Functional mobility training;Therapeutic activities;Therapeutic exercise;Balance training;Patient/family education    PT Goals (Current goals can be found in the Care Plan section)  Acute Rehab PT Goals Patient Stated Goal: to go home PT Goal Formulation: With patient/family Time For Goal Achievement: 08/22/22 Potential to Achieve Goals: Good    Frequency Min 3X/week     Co-evaluation               AM-PAC PT "6 Clicks" Mobility  Outcome Measure Help needed turning from your back to your side while in a flat bed without using bedrails?: Total Help needed moving from lying on your back to sitting on the side of a flat bed without using bedrails?: Total Help needed moving to and from a bed to a chair (including a wheelchair)?: Total Help needed standing up from a chair using your arms (e.g., wheelchair  or bedside chair)?: Total Help needed to walk in hospital room?: Total Help needed climbing 3-5 steps with a railing? : Total 6 Click Score: 6    End of Session Equipment Utilized During Treatment: Gait belt Activity Tolerance: Patient limited by fatigue;Patient limited by lethargy Patient left: in bed;with call bell/phone within reach;with bed alarm set;with family/visitor present Nurse Communication: Mobility status PT Visit Diagnosis: Muscle weakness (generalized) (M62.81)    Time: 0539-7673 PT Time Calculation (min) (ACUTE ONLY): 27 min   Charges:   PT Evaluation $PT Eval Moderate Complexity: 1 Mod PT Treatments $Therapeutic Activity: 8-22 mins        [facility] M,PT Acute Rehab Services [phone]   Alvira Philips 08/08/2022, 3:15 PM

## 2022-08-08 NOTE — Progress Notes (Signed)
PROGRESS NOTE  Destiny Day [facility]:[id number] DOB: [birth year] DOA: 08/06/2022 PCP: Cipriano Mile, NP   LOS: 6 days   Brief Narrative / Interim history:  72 year old female with history of ESRD on MWF HD, asthma/COPD, diastolic CHF, HTN, HLD, DM2, comes into the hospital with few days of right lower quadrant abdominal pain.  Imaging in the ED concerning for perforated appendicitis with a periappendiceal abscess with probably secondary involvement of the previously noted adnexal mass or a tubo-ovarian abscess.  She was placed on antibiotics, general surgery was consulted and she was admitted to the hospital.  Subjective / 24h Interval events:  Reports right lower quadrant pain remains the same, reports lower extremity pain much improved  Assesement and Plan:  Intra-abdominal abscess, concern for perforated appendicitis  -patient started on antibiotics with Zosyn, continue.  General surgery consulted, IR consulted and she is status post right lower quadrant abscess drain placement.  Microbiology so far shows GNR, GPC in chains and GPR.  -drain culture growing E. coli and Klebsiella, continiue with   Zosyn, discussed with ID, will see tomorrow -Management per general surgery, CT abdomen pelvis obtained yesterday it is reassuring, plan to continue with IV antibiotics for now, with IR to manage drain, at 1 point she will need surgery when stable  Essential hypertension- continue home medications.  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  DM2-continue sliding scale.  CBG (last 3)  Recent Labs    08/07/22 2054 08/08/22 0759 08/08/22 1235  GLUCAP 297* 248* 237*    Scheduled Meds:  acidophilus  1 capsule Oral Daily   atorvastatin  40 mg Oral QHS   carvedilol  12.5 mg Oral Q M,W,F-HD   carvedilol  12.5 mg Oral 2 times per day on Tue Thu Sat   Chlorhexidine Gluconate Cloth  6 each Topical Q0600   Chlorhexidine Gluconate Cloth  6 each Topical Q0600   doxercalciferol  7 mcg Intravenous Q M,W,F-HD   ferric citrate  420 mg Oral TID WC   fluticasone furoate-vilanterol  1 puff Inhalation Daily   gabapentin  100 mg Oral QHS   heparin injection (subcutaneous)  5,000 Units Subcutaneous Q8H   insulin aspart  0-6 Units Subcutaneous TID WC   isosorbide-hydrALAZINE  1 tablet Oral 2 times per day on Sun Tue Thu Sat   isosorbide-hydrALAZINE  1 tablet Oral Q M,W,F-HD   ondansetron (ZOFRAN) IV  4 mg Intravenous Once   pantoprazole  40 mg Oral BID   sodium chloride flush  5 mL Intracatheter Q8H   ursodiol  300 mg Oral BID   Continuous Infusions:   ceFAZolin (ANCEF) IV     PRN Meds:.albuterol, busPIRone, fluticasone, hydrALAZINE, ondansetron (ZOFRAN) IV, ondansetron, oxyCODONE, prochlorperazine, traZODone  Current Outpatient Medications  Medication Instructions   albuterol (VENTOLIN HFA) 108 (90 Base) MCG/ACT inhaler 2 puffs, Inhalation, Every 4 hours PRN  amLODipine (NORVASC) 10 mg, Oral, Daily   aspirin EC 81 mg, Oral, Daily   atorvastatin (LIPITOR) 40 mg, Oral, Daily at bedtime   b complex-vitamin c-folic acid (NEPHRO-VITE) 0.8 MG TABS tablet 1 tablet, Oral, Daily at bedtime   budesonide-formoterol (SYMBICORT) 160-4.5 MCG/ACT inhaler 2 puffs, Inhalation, 2 times  daily   busPIRone (BUSPAR) 7.5 mg, Oral, Daily PRN   carvedilol (COREG) 12.5 mg, Oral, See admin instructions, Take 1 tablet (12.5 mg) by mouth once daily in the evening on Mondays, Wednesdays, & Fridays. (Dialysis)<BR>Take 1 tablet (12.5 mg) by mouth twice daily on 'Sundays, Tuesdays, Thursdays, & Saturdays. (Non Dialysis)   cinacalcet (SENSIPAR) 60 mg, Oral, See admin instructions, Take 1 tablet (60 mg) by mouth in the evening on Mondays, Wednesdays, & Fridays (dialysis)<BR>Take 1 tablet (60 mg) by mouth twice daily on Sundays, Tuesdays, Thursdays, Saturdays. (non dialysis)   diphenhydrAMINE HCl (BENADRYL ALLERGY PO) 1 capsule, Oral, Every M-W-F (Hemodialysis), If needed to help with allergies   EPINEPHrine (AUVI-Q) 0.3 mg, Intramuscular, As needed   ferric citrate (AURYXIA) 210-420 mg, Oral, See admin instructions, Take 2 tablets (420 mg) by mouth with each meal & take 1 tablet (210 mg) by mouth with snacks.    fluticasone (FLONASE) 50 MCG/ACT nasal spray 2 sprays, Each Nare, Daily PRN   fluticasone-salmeterol (ADVAIR HFA) 115-21 MCG/ACT inhaler Inhale two puffs twice daily   gabapentin (NEURONTIN) 100 mg, Oral, Daily at bedtime, When necessary for neuropathy pain   glipiZIDE (GLUCOTROL) 5 mg, Oral, Daily at bedtime   isosorbide-hydrALAZINE (BIDIL) 20-37.5 MG tablet 1 tablet, Oral, See admin instructions, hs on Monday,Wednesday,Friday (dialysis days)<BR>Tid on Tuesday,Thursday,Saturday,Sunday(non-dialysis days)   latanoprost (XALATAN) 0.005 % ophthalmic solution 1 drop, Left Eye, Daily at bedtime   levocetirizine (XYZAL) 5 mg, Oral, Every evening   lidocaine-prilocaine (EMLA) cream 1 Application, Topical, As needed   loperamide (IMODIUM) 4 mg, Oral, 3 times daily PRN   Methoxy PEG-Epoetin Beta (MIRCERA IJ) Mircera   montelukast (SINGULAIR) 10 MG tablet TAKE 1 TABLET BY MOUTH EVERYDAY AT BEDTIME   ofloxacin (OCUFLOX) 0.3 % ophthalmic solution 1 drop, 4 times daily   omeprazole (PRILOSEC) 20 mg,  Oral, Daily PRN   ondansetron (ZOFRAN) 4 mg, Oral, Every 6 hours PRN   prednisoLONE acetate (PRED FORTE) 1 % ophthalmic suspension 1 drop, 4 times daily   promethazine-dextromethorphan (PROMETHAZINE-DM) 6.25-15 MG/5ML syrup 5 mLs, Oral, 4 times daily PRN   Spacer/Aero-Holding Chambers (AEROCHAMBER PLUS) inhaler Use with inhaler   traZODone (DESYREL) 25-50 mg, Oral, At bedtime PRN   ursodiol (ACTIGALL) 250 mg, Oral, 2 times daily    Diet Orders (From admission, onward)     Start     Ordered   08/07/22 0852  Diet renal with fluid restriction Fluid restriction: 1200 mL Fluid; Room service appropriate? Yes; Fluid consistency: Thin  Diet effective now       Question Answer Comment  Fluid restriction: 1200 mL Fluid   Room service appropriate? Yes   Fluid consistency: Thin      01'$ /12/24 0851            DVT prophylaxis: heparin injection 5,000 Units Start: 08/05/22 1445 SCDs Start: 08/11/2022 2213   Lab Results  Component Value Date   PLT 365 08/08/2022      Code Status: Full Code  Family Communication: D/W husband at bedside  Status is: Inpatient  Remains inpatient appropriate because: severity of illness  Level of care: Progressive  Consultants:  General surgery IR Nephrology   Objective:  Vitals:   08/07/22 2055 08/08/22 0538 08/08/22 0801 08/08/22 1245  BP: 116/63 (!) 174/51 (!) 158/44 (!) 106/36  Pulse: 75 70 69 70  Resp: '13 14 15 19  '$ Temp: 98.4 F (36.9 C) 98.7 F (37.1 C) 98.2 F (36.8 C) 97.6 F (36.4 C)  TempSrc: Oral Oral Oral Oral  SpO2: 96% 96% 94% 93%  Weight:      Height:        Intake/Output Summary (Last 24 hours) at 08/08/2022 1445 Last data filed at 08/08/2022 0510 Gross per 24 hour  Intake 10 ml  Output 35 ml  Net -25 ml   Wt Readings from Last 3 Encounters:  08/07/22 59.2 kg  05/29/22 63.5 kg  02/19/22 61.7 kg    Examination:  Awake Alert, Oriented X 3, No new F.N deficits, Normal affect Symmetrical Chest wall movement, Good  air movement bilaterally, CTAB RRR,No Gallops,Rubs or new Murmurs, No Parasternal Heave +ve B.Sounds, Abd Soft, right lower quadrant drain present  no Cyanosis, Clubbing or edema, No new Rash or bruise       Data Reviewed: I have independently reviewed following labs and imaging studies   CBC Recent Labs  Lab 08/16/2022 1730 08/03/22 0008 08/04/22 0438 08/05/22 0403 08/06/22 0433 08/07/22 0627 08/08/22 0154  WBC 19.6*   < > 24.8* 13.6* 20.2* 20.9* 24.8*  HGB 11.6*   < > 10.4* 15.2* 12.0 11.5* 11.5*  HCT 36.5   < > 32.2* 46.8* 37.5 36.6 34.2*  PLT 243   < > 288 196 382 420* 365  MCV 100.0   < > 99.4 97.9 99.5 99.7 95.8  MCH 31.8   < > 32.1 31.8 31.8 31.3 32.2  MCHC 31.8   < > 32.3 32.5 32.0 31.4 33.6  RDW 12.9   < > 13.2 13.2 13.3 13.3 13.2  LYMPHSABS 0.9  --   --   --   --   --   --   MONOABS 0.7  --   --   --   --   --   --   EOSABS 0.1  --   --   --   --   --   --   BASOSABS 0.0  --   --   --   --   --   --    < > = values in this interval not displayed.    Recent Labs  Lab 07/27/2022 1730 08/03/22 0008 08/03/22 0422 08/04/22 0438 08/05/22 0403 08/07/22 0627  NA 129*  --  130* 131* 129* 131*  K 3.3*  --  3.2* 4.4 3.3* 3.1*  CL 88*  --  92* 92* 88* 88*  CO2 25  --  '26 25 25 24  '$ GLUCOSE 250*  --  165* 148* 152* 180*  BUN 21  --  23 32* 22 36*  CREATININE 6.42*  --  6.91* 8.10* 5.14* 5.33*  CALCIUM 9.7  --  9.2 9.6 9.4 9.5  AST 19  --  '15 15 15  '$ --   ALT 9  --  '9 10 10  '$ --   ALKPHOS 93  --  86 86 73  --   BILITOT 0.6  --  0.6 0.6 0.5  --   ALBUMIN 2.7*  --  2.3* 2.2* 2.2* 2.3*  MG  --  1.8  --  2.1 2.0  --   CRP  --  20.4*  --   --   --   --  PROCALCITON  --  2.03 1.62 2.02  --   --   LATICACIDVEN  --  1.1  --   --   --   --   INR  --   --  1.0  --   --   --   TSH  --  0.760  --   --   --   --   HGBA1C  --  6.2*  --   --   --   --   BNP  --  0,938.1*  --   --   --   --      ------------------------------------------------------------------------------------------------------------------ No results for input(s): "CHOL", "HDL", "LDLCALC", "TRIG", "CHOLHDL", "LDLDIRECT" in the last 72 hours.  Lab Results  Component Value Date   HGBA1C 6.2 (H) 08/03/2022   ------------------------------------------------------------------------------------------------------------------ No results for input(s): "TSH", "T4TOTAL", "T3FREE", "THYROIDAB" in the last 72 hours.  Invalid input(s): "FREET3"   Cardiac Enzymes No results for input(s): "CKMB", "TROPONINI", "MYOGLOBIN" in the last 168 hours.  Invalid input(s): "CK" ------------------------------------------------------------------------------------------------------------------    Component Value Date/Time   BNP 1,676.4 (H) 08/03/2022 0008    CBG: Recent Labs  Lab 08/07/22 1421 08/07/22 1635 08/07/22 2054 08/08/22 0759 08/08/22 1235  GLUCAP 108* 273* 297* 248* 237*    Recent Results (from the past 240 hour(s))  Aerobic/Anaerobic Culture w Gram Stain (surgical/deep wound)     Status: None   Collection Time: 08/03/22  1:26 PM   Specimen: Wound; Abscess  Result Value Ref Range Status   Specimen Description WOUND  Final   Special Requests  ABCESS ABDOMEN  Final   Gram Stain   Final    ABUNDANT WBC PRESENT, PREDOMINANTLY PMN ABUNDANT GRAM NEGATIVE RODS FEW GRAM POSITIVE COCCI IN PAIRS IN CHAINS FEW GRAM POSITIVE RODS    Culture   Final    ABUNDANT ESCHERICHIA COLI MODERATE KLEBSIELLA PNEUMONIAE ABUNDANT BACTEROIDES THETAIOTAOMICRON BETA LACTAMASE POSITIVE Performed at [facility], [place] [address] [place], [place] [address]    Report Status 08/08/2022 FINAL  Final   Organism ID, Bacteria ESCHERICHIA COLI  Final   Organism ID, Bacteria KLEBSIELLA PNEUMONIAE  Final      Susceptibility   Escherichia coli - MIC*    AMPICILLIN >=32 RESISTANT Resistant     CEFAZOLIN <=4 SENSITIVE Sensitive      CEFEPIME <=0.12 SENSITIVE Sensitive     CEFTAZIDIME <=1 SENSITIVE Sensitive     CEFTRIAXONE <=0.25 SENSITIVE Sensitive     CIPROFLOXACIN <=0.25 SENSITIVE Sensitive     GENTAMICIN <=1 SENSITIVE Sensitive     IMIPENEM <=0.25 SENSITIVE Sensitive     TRIMETH/SULFA <=20 SENSITIVE Sensitive     AMPICILLIN/SULBACTAM >=32 RESISTANT Resistant     PIP/TAZO <=4 SENSITIVE Sensitive     * ABUNDANT ESCHERICHIA COLI   Klebsiella pneumoniae - MIC*    AMPICILLIN RESISTANT Resistant     CEFAZOLIN <=4 SENSITIVE Sensitive     CEFEPIME <=0.12 SENSITIVE Sensitive     CEFTAZIDIME <=1 SENSITIVE Sensitive     CEFTRIAXONE <=0.25 SENSITIVE Sensitive     CIPROFLOXACIN <=0.25 SENSITIVE Sensitive     GENTAMICIN <=1 SENSITIVE Sensitive     IMIPENEM <=0.25 SENSITIVE Sensitive     TRIMETH/SULFA <=20 SENSITIVE Sensitive     AMPICILLIN/SULBACTAM 4 SENSITIVE Sensitive     PIP/TAZO <=4 SENSITIVE Sensitive     * MODERATE KLEBSIELLA PNEUMONIAE     Radiology Studies: No results found.   Phillips Climes MD [facility]  Between 7 am - 7 pm I am  available, please contact me via Amion (for emergencies) or Securechat (non urgent messages)  Between 7 pm - 7 am I am not available, please contact night coverage MD/APP via Amion

## 2022-08-08 NOTE — Consult Note (Signed)
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 She is s/p ir drain placement with improved abscess size but complicated course with leukocytosis    Micro: 1/08 fresh drain abd abscess cx - ecoli (R amp-sulb; S cefazolin, cipro, bactrim), bacteroides, kleb pna (S cefazolin, cipro, bactrim) 1/08 acute hep B panel positive hep B sAb, negative sAg  Procedures/imaging: 1/08 ir placed abscess percutaneous drain; 30 cc purulent fluid retrieved 1/11 repeat abd pelv ct slight improvement size of abscess  Patient has had rising leukocytosis depsite improving abscess size, good drain placement location, and appropriate abx. She has no sign of sepsis physiology otherwise. Her thrombocytosis however, is improving and clinically she is appearing/feeling well  Piptazo is a very good medication for intraabd abscess outside of esbl which was not retrieved. It does miss candida species but often we don't empirically start patient on this unless retrievable culture for such.   She has hurthle cell cancer, complex ovarian mass and left renal mass and we might raise question if they are active in any of these leukocytosis response.   She does have new onset diarrhea 24 hours and the left lower extremity is cold and a lot more tender than the RLE. I spoke with dr [facility] who  reported removing and readding her gabapentin with some improved in bilateral chronic LE pain, however agree the cold sensation is concerning  No sign of drug rash Her lft was normal until 1/10. Gen surg team is planning hida to look for potential acalculous cholecystitis  At this time, I would keep her on current antibiotics vs switching to cefpodoxim/flagyl to finish course and continue to monitor wbc rise of unclear infectious significance at this time   Plan: Continue current abx Would check lactate, abi to determine if ischemic LLE which could contribute to leukocytosis. If hida scan negative, and no concern of necrosis of LLE ischemia, would then assess for cdiff with diarrhea She needs outpatient f/u with endocrinology/urology/gynecology for other chest/abd/pelv ct finding as mentioned  Discussed with primary team   I spent 75 minute reviewing data/chart, and coordinating care and >50% direct face to face time providing counseling/discussing diagnostics/treatment plan with patient      ------------------------------------------------ Principal Problem:   Abdominal infection ([place])    HPI: Destiny Day is a 72 y.o. female HFpEF< dm2, pvd s/p prior revascularization, and bilateral tma, neuropathy, copd, esrd on tiw dialysis, admitted 08/26/2022 for abd pain found tohave intraabd abscess. She is s/p ir drain placement with improved abscess size but complicated course with leukocytosis  Patient is s/p ir drain placement and repeat ct is improving  She continues to have poor mentation with worsening leukocytosis without fever/hypothermia  Gabapentin taken off with worsening bilateral LE pain/neuropathy so readdedd Lft normal and hasn't been repeated No dysuria but she is dialysis patient (via avg--no  issue) No cough/dyspnea No headache  Last 24 hours consant diarrhea Abd pain not severe. Unclear if improving  Surgery planning hida scan in setting leukocytosis  No  rash No other complaint  Husband at bedside and agree diarrhea new and abd pain improving.     Family History  Problem Relation Age of Onset   Other Mother        not sure of cause of death   Diabetes Father    Diabetes Sister    Diabetes Sister    Pancreatic cancer Maternal Grandmother    Colon cancer Neg Hx     Social History   Tobacco Use   Smoking status: Former    Packs/day: 0.35    Years: 40.00    Total pack years: 14.00    Types: Cigarettes    Quit date: 05/10/2012    Years since quitting: 10.2   Smokeless tobacco: Never  Vaping Use   Vaping Use: Never used  Substance Use Topics   Alcohol use: No   Drug use: No    Comment: marijuana; quit in early 1980's    Allergies  Allergen Reactions   Adhesive  [Tape] Hives   Lactose Intolerance (Gi) Diarrhea   Lisinopril Cough   Prednisone Swelling and Other (See Comments)    Excessive fluid buildup    Review of Systems: ROS All Other ROS was negative, except mentioned above   Past Medical History:  Diagnosis Date   Abdominal bruit    Anxiety    Arthritis    Osteoarthritis   Asthma    Cervical disc disease    "pinced nerve"   CHF (congestive heart failure) (HCC)    COPD (chronic obstructive pulmonary disease) ([place])    Diabetes mellitus    Type II   Diverticulitis    ESRD (end stage renal disease) ([place])    dialysis - M/W/F- [facility]   GERD (gastroesophageal reflux disease)    from medications   GI bleed 03/31/2013   Head injury 07/2017   History of hiatal hernia    Hyperlipidemia    Hypertension    Neuropathy    left leg   Nuclear sclerotic cataract of right eye 04/23/2020   Osteoporosis    Peripheral vascular disease ([place])    Pneumonia    "very young" and a few years ago   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)    Vitrectomy membrane peel endolaser 05-07-2021   Seasonal allergies    Shortness of breath dyspnea    WIth  exertion, when fluid builds   Sleep apnea    can't afford cpap    Vitreomacular traction syndrome, 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   05-07-2021  Vitrectomy, membrane peel, PRP, resection posterior segment of NVD, remnants of peripheral NVE remain in place due to atrophic retina, no vitreous substitute       Scheduled Meds:  acidophilus  1 capsule Oral Daily   atorvastatin  40 mg Oral QHS   carvedilol  12.5 mg Oral Q M,W,F-HD   carvedilol  12.5 mg Oral 2 times per day on Tue Thu Sat   Chlorhexidine Gluconate Cloth  6 each Topical Q0600   Chlorhexidine Gluconate Cloth  6 each Topical Q0600   doxercalciferol  7 mcg Intravenous Q M,W,F-HD   ferric citrate  420 mg Oral TID WC   fluticasone furoate-vilanterol  1 puff Inhalation Daily   gabapentin  100 mg Oral QHS   heparin injection (subcutaneous)  5,000  Units Subcutaneous Q8H   insulin aspart  0-6 Units Subcutaneous TID WC   isosorbide-hydrALAZINE  1 tablet Oral 2 times per day on Sun Tue Thu Sat   isosorbide-hydrALAZINE  1 tablet Oral Q M,W,F-HD   ondansetron (ZOFRAN) IV  4 mg Intravenous Once   pantoprazole  40 mg Oral BID   sodium chloride flush  5 mL Intracatheter Q8H   ursodiol  300 mg Oral BID   Continuous Infusions:  piperacillin-tazobactam 2.25 g (08/08/22 1547)   PRN Meds:.albuterol, busPIRone, fluticasone, hydrALAZINE, ondansetron (ZOFRAN) IV, ondansetron, oxyCODONE, prochlorperazine, traZODone   OBJECTIVE: Blood pressure (!) 158/55, pulse 70, temperature 98.5 F (36.9 C), temperature source Oral, resp. rate 17, height '5\' 5"'$  (1.651 m), weight 59.2 kg, SpO2 98 %.  Physical Exam  General/constitutional: no distress, pleasant, some conversation HEENT: Normocephalic, PER, Conj Clear Neck supple CV: rrr no mrg Lungs: clear to auscultation, normal respiratory effort Abd: Soft, mild tenderness no localization -- jp drain with green bloody fluid Ext: no edema Skin: No Rash Neuro: drowsy/lethargic; nonfocal otherwise MSK: no joint  pain/swelling/back pain; left LE very cold; I couldn't palpate DP pulse; very tender LLE >>> RLE     Lab Results Lab Results  Component Value Date   WBC 24.8 (H) 08/08/2022   HGB 11.5 (L) 08/08/2022   HCT 34.2 (L) 08/08/2022   MCV 95.8 08/08/2022   PLT 365 08/08/2022    Lab Results  Component Value Date   CREATININE 5.33 (H) 08/07/2022   BUN 36 (H) 08/07/2022   NA 131 (L) 08/07/2022   K 3.1 (L) 08/07/2022   CL 88 (L) 08/07/2022   CO2 24 08/07/2022    Lab Results  Component Value Date   ALT 10 08/05/2022   AST 15 08/05/2022   ALKPHOS 73 08/05/2022   BILITOT 0.5 08/05/2022      Microbiology: Recent Results (from the past 240 hour(s))  Aerobic/Anaerobic Culture w Gram Stain (surgical/deep wound)     Status: None   Collection Time: 08/03/22  1:26 PM   Specimen: Wound; Abscess  Result Value Ref Range Status   Specimen Description WOUND  Final   Special Requests  ABCESS ABDOMEN  Final   Gram Stain   Final    ABUNDANT WBC PRESENT, PREDOMINANTLY PMN ABUNDANT GRAM NEGATIVE RODS FEW GRAM POSITIVE COCCI IN PAIRS IN CHAINS FEW GRAM POSITIVE RODS    Culture   Final    ABUNDANT ESCHERICHIA COLI MODERATE KLEBSIELLA PNEUMONIAE ABUNDANT BACTEROIDES THETAIOTAOMICRON BETA LACTAMASE POSITIVE Performed at [facility], [place] [address], [place], [place] [address]    Report Status 08/08/2022 FINAL  Final   Organism ID, Bacteria ESCHERICHIA COLI  Final   Organism ID, Bacteria KLEBSIELLA PNEUMONIAE  Final      Susceptibility   Escherichia coli - MIC*    AMPICILLIN >=32 RESISTANT Resistant     CEFAZOLIN <=4 SENSITIVE Sensitive     CEFEPIME <=0.12 SENSITIVE Sensitive     CEFTAZIDIME <=1 SENSITIVE Sensitive     CEFTRIAXONE <=0.25 SENSITIVE Sensitive     CIPROFLOXACIN <=0.25 SENSITIVE Sensitive     GENTAMICIN <=1 SENSITIVE Sensitive     IMIPENEM <=0.25 SENSITIVE Sensitive     TRIMETH/SULFA <=20 SENSITIVE Sensitive     AMPICILLIN/SULBACTAM >=32 RESISTANT Resistant      PIP/TAZO <=4 SENSITIVE Sensitive     * ABUNDANT ESCHERICHIA COLI   Klebsiella pneumoniae - MIC*    AMPICILLIN RESISTANT Resistant     CEFAZOLIN <=4 SENSITIVE Sensitive  CEFEPIME <=0.12 SENSITIVE Sensitive     CEFTAZIDIME <=1 SENSITIVE Sensitive     CEFTRIAXONE <=0.25 SENSITIVE Sensitive     CIPROFLOXACIN <=0.25 SENSITIVE Sensitive     GENTAMICIN <=1 SENSITIVE Sensitive     IMIPENEM <=0.25 SENSITIVE Sensitive     TRIMETH/SULFA <=20 SENSITIVE Sensitive     AMPICILLIN/SULBACTAM 4 SENSITIVE Sensitive     PIP/TAZO <=4 SENSITIVE Sensitive     * MODERATE KLEBSIELLA PNEUMONIAE     Serology:    Imaging: If present, new imagings (plain films, ct scans, and mri) have been personally visualized and interpreted; radiology reports have been reviewed. Decision making incorporated into the Impression / Recommendations.  1/11 ct abd pelv with contrast 1. Significant interval decrease in size of the right pelvic abscess status post percutaneous drainage, with pigtail drainage catheter appropriately positioned. There are a few residual tiny foci of internal gas, but no significant residual fluid component. The tip of the appendix terminates within and is inseparable from this area, again suspicious for perforated appendicitis. There are several loops of small bowel adjacent to this area, but without obvious fistula or small-bowel inflammatory change. This area remains inseparable from the right ovary. 2. Unchanged 3.8 cm complex right ovarian mass with internal calcification, previously characterized on prior ultrasound. Gynecological consultation was recommended at that time. 3. Unchanged 2.1 cm heterogeneously enhancing left upper pole renal lesion, suspicious for primary renal neoplasm. Outpatient non-emergent follow-up renal protocol CT or MRI with and without contrast is recommended for further evaluation. 4. Unchanged cholelithiasis. 5.  Aortic Atherosclerosis   1/09 cxr No acute  cardiopulm process   1/07 abd pelv ct with contrast 1. Interval enlargement of the previously noted loculated collection along the right pelvic sidewall with irregular mural enhancement, enhancing internal septa, and mild surrounding inflammatory stranding. Additionally, there has developed gas within the superior aspect of the mass, best seen on axial image # 68/3. When compared to remote prior examination of 10/15/2020, this is seen immediately adjacent to the appendix and complex right adnexal cystic lesion. Altogether, the findings are suspicious for a perforated appendicitis with a periappendiceal abscess with probable secondary involvement of the previously noted adnexal mass or a tubo-ovarian abscess. A malignant lesion is considered less likely given its relatively rapid evolution though a secondarily infected malignancy could appear in this fashion. Surgical consultation is recommended. 2. 2.1 cm hypoenhancing lesion within the upper pole of the left kidney suspicious for a primary renal neoplasm. Dedicated renal mass protocol CT or MRI examination is recommended for evaluation once the patient's acute issues have resolved. 3. Cholelithiasis. 4. Small right fat containing femoral hernia. Previously noted bowel within the hernia sac has spontaneously reduced.   1/07 ruq [place] 1. Interval enlargement of the previously noted loculated collection along the right pelvic sidewall with irregular mural enhancement, enhancing internal septa, and mild surrounding inflammatory stranding. Additionally, there has developed gas within the superior aspect of the mass, best seen on axial image # 68/3. When compared to remote prior examination of 10/15/2020, this is seen immediately adjacent to the appendix and complex right adnexal cystic lesion. Altogether, the findings are suspicious for a perforated appendicitis with a periappendiceal abscess with probable secondary involvement of the  previously noted adnexal mass or a tubo-ovarian abscess. A malignant lesion is considered less likely given its relatively rapid evolution though a secondarily infected malignancy could appear in this fashion. Surgical consultation is recommended. 2. 2.1 cm hypoenhancing lesion within the upper pole of the left kidney suspicious  for a primary renal neoplasm. Dedicated renal mass protocol CT or MRI examination is recommended for evaluation once the patient's acute issues have resolved. 3. Cholelithiasis. 4. Small right fat containing femoral hernia. Previously noted bowel within the hernia sac has spontaneously reduced.  Jabier Mutton, [place] [facility] [place] [phone] pager    08/08/2022, 5:22 PM

## 2022-08-08 NOTE — Progress Notes (Signed)
[facility] Progress Note   Subjective:    Seen and examined patient at bedside. Seen sleeping in recliner. Responds to voice. Denies ABD currently. Next HD 1/15.  Objective Vitals:   08/07/22 1600 08/07/22 2055 08/08/22 0538 08/08/22 0801  BP: 110/80 116/63 (!) 174/51 (!) 158/44  Pulse: 89 75 70 69  Resp: (!) '22 13 14 15  '$ Temp: 98 F (36.7 C) 98.4 F (36.9 C) 98.7 F (37.1 C) 98.2 F (36.8 C)  TempSrc: Oral Oral Oral Oral  SpO2: 100% 96% 96% 94%  Weight:      Height:       Physical Exam General: Thin chronically ill elderly female Heart: RRR no MRG Lungs: CTA bilaterally nonlabored breathing Abdomen: NABS, soft, non-distended, mildly tender with palpation; RLQ drain placed in IR- dark fluid in tube but none in bag Extremities: No pedal edema Dialysis Access: LUA AVG+ bruit  Filed Weights   08/05/22 0727 08/05/22 1149 08/07/22 0903  Weight: 59.5 kg 57.5 kg 59.2 kg    Intake/Output Summary (Last 24 hours) at 08/08/2022 1204 Last data filed at 08/08/2022 0510 Gross per 24 hour  Intake 10 ml  Output 1235 ml  Net -1225 ml    Additional Objective Labs: Basic Metabolic Panel: Recent Labs  Lab 08/04/22 0438 08/05/22 0403 08/07/22 0627  NA 131* 129* 131*  K 4.4 3.3* 3.1*  CL 92* 88* 88*  CO2 '25 25 24  '$ GLUCOSE 148* 152* 180*  BUN 32* 22 36*  CREATININE 8.10* 5.14* 5.33*  CALCIUM 9.6 9.4 9.5  PHOS 6.6*  --  5.0*   Liver Function Tests: Recent Labs  Lab 08/03/22 0422 08/04/22 0438 08/05/22 0403 08/07/22 0627  AST '15 15 15  '$ --   ALT '9 10 10  '$ --   ALKPHOS 86 86 73  --   BILITOT 0.6 0.6 0.5  --   PROT 5.9* 6.2* 6.0*  --   ALBUMIN 2.3* 2.2* 2.2* 2.3*   Recent Labs  Lab 08/07/2022 1730  LIPASE 32   CBC: Recent Labs  Lab 07/28/2022 1730 08/03/22 0008 08/04/22 0438 08/05/22 0403 08/06/22 0433 08/07/22 0627 08/08/22 0154  WBC 19.6*   < > 24.8* 13.6* 20.2* 20.9* 24.8*  NEUTROABS 17.7*  --   --   --   --   --   --   HGB 11.6*   < >  10.4* 15.2* 12.0 11.5* 11.5*  HCT 36.5   < > 32.2* 46.8* 37.5 36.6 34.2*  MCV 100.0   < > 99.4 97.9 99.5 99.7 95.8  PLT 243   < > 288 196 382 420* 365   < > = values in this interval not displayed.   Blood Culture    Component Value Date/Time   SDES WOUND 08/03/2022 1326   SPECREQUEST  ABCESS ABDOMEN 08/03/2022 1326   CULT  08/03/2022 1326    ABUNDANT ESCHERICHIA COLI MODERATE KLEBSIELLA PNEUMONIAE ABUNDANT BACTEROIDES THETAIOTAOMICRON BETA LACTAMASE POSITIVE Performed at [facility], [place] [address], [place], [place] [address]    REPTSTATUS PENDING 08/03/2022 1326    Cardiac Enzymes: No results for input(s): "CKTOTAL", "CKMB", "CKMBINDEX", "TROPONINI" in the last 168 hours. CBG: Recent Labs  Lab 08/07/22 0832 08/07/22 1421 08/07/22 1635 08/07/22 2054 08/08/22 0759  GLUCAP 181* 108* 273* 297* 248*   Iron Studies: No results for input(s): "IRON", "TIBC", "TRANSFERRIN", "FERRITIN" in the last 72 hours. Lab Results  Component Value Date   INR 1.0 08/03/2022   INR 1.1 05/17/2019  INR 1.02 08/23/2017   Studies/Results: CT ABDOMEN PELVIS W CONTRAST  Result Date: 08/06/2022 CLINICAL DATA:  Worsening right-sided abdominal pain. Status post right pelvic abscess drainage 3 days ago. EXAM: CT ABDOMEN AND PELVIS WITH CONTRAST TECHNIQUE: Multidetector CT imaging of the abdomen and pelvis was performed using the standard protocol following bolus administration of intravenous contrast. RADIATION DOSE REDUCTION: This exam was performed according to the departmental dose-optimization program which includes automated exposure control, adjustment of the mA and/or kV according to patient size and/or use of iterative reconstruction technique. CONTRAST:  77m OMNIPAQUE IOHEXOL 350 MG/ML SOLN COMPARISON:  CT abdomen pelvis dated August 02, 2022. FINDINGS: Lower chest: No acute abnormality. Hepatobiliary: No focal liver abnormality. Multiple tiny layering gallstones again noted. No  gallbladder wall thickening or biliary dilatation. Pancreas: Unremarkable. No pancreatic ductal dilatation or surrounding inflammatory changes. Spleen: Normal in size without focal abnormality. Adrenals/Urinary Tract: Adrenal glands are unremarkable. Similar bilateral renal atrophy with delayed renal enhancement and contrast excretion, consistent with medical renal disease. Unchanged round 2.1 cm heterogeneously enhancing lesion in the upper pole of the left kidney. Extensive bilateral renovascular calcifications again noted. Small bilateral renal simple cysts are unchanged. No follow-up imaging is recommended. No hydronephrosis. The bladder is decompressed. Stomach/Bowel: Stomach is within normal limits. The small bowel and colon are unremarkable. The tip of the appendix is again seen terminating in the right pelvic complex fluid collection (series 6, images 65-91), which is significantly decreased in size status post percutaneous drainage, with pigtail drainage catheter appropriately positioned. There are a few residual tiny foci of internal gas, but no significant residual fluid component. There are several loops of small bowel adjacent to this area, but without obvious fistula or small-bowel inflammatory change. This area remains inseparable from the right ovary. Vascular/Lymphatic: Aortic atherosclerosis. No enlarged abdominal or pelvic lymph nodes. Reproductive: Prior hysterectomy and left oophorectomy. Unchanged 3.8 x 3.0 cm complex right ovarian mass with internal calcification. Other: No new fluid collection. No free fluid or pneumoperitoneum. Unchanged small fat containing right inguinal and femoral hernias. Musculoskeletal: No acute or significant osseous findings. IMPRESSION: 1. Significant interval decrease in size of the right pelvic abscess status post percutaneous drainage, with pigtail drainage catheter appropriately positioned. There are a few residual tiny foci of internal gas, but no significant  residual fluid component. The tip of the appendix terminates within and is inseparable from this area, again suspicious for perforated appendicitis. There are several loops of small bowel adjacent to this area, but without obvious fistula or small-bowel inflammatory change. This area remains inseparable from the right ovary. 2. Unchanged 3.8 cm complex right ovarian mass with internal calcification, previously characterized on prior ultrasound. Gynecological consultation was recommended at that time. 3. Unchanged 2.1 cm heterogeneously enhancing left upper pole renal lesion, suspicious for primary renal neoplasm. Outpatient non-emergent follow-up renal protocol CT or MRI with and without contrast is recommended for further evaluation. 4. Unchanged cholelithiasis. 5.  Aortic Atherosclerosis (ICD10-I70.0). Electronically Signed   By: WTitus DubinM.D.   On: 08/06/2022 18:53    Medications:  piperacillin-tazobactam 2.25 g (08/08/22 0554)    acidophilus  1 capsule Oral Daily   atorvastatin  40 mg Oral QHS   carvedilol  12.5 mg Oral Q M,W,F-HD   carvedilol  12.5 mg Oral 2 times per day on Tue Thu Sat   Chlorhexidine Gluconate Cloth  6 each Topical Q0600   Chlorhexidine Gluconate Cloth  6 each Topical Q0600   doxercalciferol  7 mcg Intravenous Q  M,W,F-HD   ferric citrate  420 mg Oral TID WC   fluticasone furoate-vilanterol  1 puff Inhalation Daily   gabapentin  100 mg Oral QHS   heparin injection (subcutaneous)  5,000 Units Subcutaneous Q8H   insulin aspart  0-6 Units Subcutaneous TID WC   isosorbide-hydrALAZINE  1 tablet Oral 2 times per day on Sun Tue Thu Sat   isosorbide-hydrALAZINE  1 tablet Oral Q M,W,F-HD   ondansetron (ZOFRAN) IV  4 mg Intravenous Once   pantoprazole (PROTONIX) IV  40 mg Intravenous Q12H   sodium chloride flush  5 mL Intracatheter Q8H   ursodiol  300 mg Oral BID    Dialysis Orders: [facility] MWF  4h  400/600   57.4kg   2/3.5 bath LUA AVG  Heparin 2500+ 1021mdrun - last  HD 1/5, post wt 57.3kg - hectorol 7 ug IV tiw - venofer 50 q week - mircera 30 ug q4 wks, last 12/8, due now    CXR 1/09 - no acute process  Assessment/Plan: Abd pain/ intra-abdominal infection or abscess - per CT imaging. Gen surg and IR consulted, started on IV abx. IR placed drain in RLQ fluid collection. Fluid sent for culture, growing GNR's. Per pmd. Per general surgery, repeat CT showed interval decrease in size of pelvic fluid collection and drain in good position. No emergent surgery is needed at this time but WBC is trending up-now 24.8. Per general surgery, may need to consider changing ABX and obtain HIDA scan to r/o cholecystitis. She remains afebrile. ESRD - on HD MWF. On 4K bath during yesterday's HD for K+ 3.1. Monitor trend closely. Will give K+ supplementation if needed. Checking labs in AM.  HTN/ volume - no edema on exam, CXR neg, 2kg up today. UFG 1.5L yesterday. 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  BMikey Bussingwhen taking meals DM 2 - per pmd COPD/ asthma - no symptoms  CTobie Poet NP [facility] 08/08/2022,12:04 PM  LOS: 6 days

## 2022-08-08 NOTE — Progress Notes (Signed)
Subjective/Chief Complaint: Still with some right sided abdominal pain, but less   Objective: Vital signs in last 24 hours: Temp:  [97.6 F (36.4 C)-98.7 F (37.1 C)] 98.2 F (36.8 C) (01/13 0801) Pulse Rate:  [44-93] 69 (01/13 0801) Resp:  [12-22] 15 (01/13 0801) BP: (101-192)/(42-88) 158/44 (01/13 0801) SpO2:  [92 %-100 %] 94 % (01/13 0801) Weight:  [59.2 kg] 59.2 kg (01/12 0903) Last BM Date : 08/07/22  Intake/Output from previous day: 01/12 0701 - 01/13 0700 In: 10  Out: 1235 [Emesis/NG output:20; Drains:15] Intake/Output this shift: No intake/output data recorded.  Exam: Awake and alert Looks comfortable Abdomen soft, tender with guarding RUQ and RLQ Drain stable  Lab Results:  Recent Labs    08/07/22 0627 08/08/22 0154  WBC 20.9* 24.8*  HGB 11.5* 11.5*  HCT 36.6 34.2*  PLT 420* 365   BMET Recent Labs    08/07/22 0627  NA 131*  K 3.1*  CL 88*  CO2 24  GLUCOSE 180*  BUN 36*  CREATININE 5.33*  CALCIUM 9.5   PT/INR No results for input(s): "LABPROT", "INR" in the last 72 hours. ABG No results for input(s): "PHART", "HCO3" in the last 72 hours.  Invalid input(s): "PCO2", "PO2"  Studies/Results: CT ABDOMEN PELVIS W CONTRAST  Result Date: 08/06/2022 CLINICAL DATA:  Worsening right-sided abdominal pain. Status post right pelvic abscess drainage 3 days ago. EXAM: CT ABDOMEN AND PELVIS WITH CONTRAST TECHNIQUE: Multidetector CT imaging of the abdomen and pelvis was performed using the standard protocol following bolus administration of intravenous contrast. RADIATION DOSE REDUCTION: This exam was performed according to the departmental dose-optimization program which includes automated exposure control, adjustment of the mA and/or kV according to patient size and/or use of iterative reconstruction technique. CONTRAST:  67m OMNIPAQUE IOHEXOL 350 MG/ML SOLN COMPARISON:  CT abdomen pelvis dated August 02, 2022. FINDINGS: Lower chest: No acute abnormality.  Hepatobiliary: No focal liver abnormality. Multiple tiny layering gallstones again noted. No gallbladder wall thickening or biliary dilatation. Pancreas: Unremarkable. No pancreatic ductal dilatation or surrounding inflammatory changes. Spleen: Normal in size without focal abnormality. Adrenals/Urinary Tract: Adrenal glands are unremarkable. Similar bilateral renal atrophy with delayed renal enhancement and contrast excretion, consistent with medical renal disease. Unchanged round 2.1 cm heterogeneously enhancing lesion in the upper pole of the left kidney. Extensive bilateral renovascular calcifications again noted. Small bilateral renal simple cysts are unchanged. No follow-up imaging is recommended. No hydronephrosis. The bladder is decompressed. Stomach/Bowel: Stomach is within normal limits. The small bowel and colon are unremarkable. The tip of the appendix is again seen terminating in the right pelvic complex fluid collection (series 6, images 65-91), which is significantly decreased in size status post percutaneous drainage, with pigtail drainage catheter appropriately positioned. There are a few residual tiny foci of internal gas, but no significant residual fluid component. There are several loops of small bowel adjacent to this area, but without obvious fistula or small-bowel inflammatory change. This area remains inseparable from the right ovary. Vascular/Lymphatic: Aortic atherosclerosis. No enlarged abdominal or pelvic lymph nodes. Reproductive: Prior hysterectomy and left oophorectomy. Unchanged 3.8 x 3.0 cm complex right ovarian mass with internal calcification. Other: No new fluid collection. No free fluid or pneumoperitoneum. Unchanged small fat containing right inguinal and femoral hernias. Musculoskeletal: No acute or significant osseous findings. IMPRESSION: 1. Significant interval decrease in size of the right pelvic abscess status post percutaneous drainage, with pigtail drainage catheter  appropriately positioned. There are a few residual tiny foci of internal gas,  but no significant residual fluid component. The tip of the appendix terminates within and is inseparable from this area, again suspicious for perforated appendicitis. There are several loops of small bowel adjacent to this area, but without obvious fistula or small-bowel inflammatory change. This area remains inseparable from the right ovary. 2. Unchanged 3.8 cm complex right ovarian mass with internal calcification, previously characterized on prior ultrasound. Gynecological consultation was recommended at that time. 3. Unchanged 2.1 cm heterogeneously enhancing left upper pole renal lesion, suspicious for primary renal neoplasm. Outpatient non-emergent follow-up renal protocol CT or MRI with and without contrast is recommended for further evaluation. 4. Unchanged cholelithiasis. 5.  Aortic Atherosclerosis (ICD10-I70.0). Electronically Signed   By: Titus Dubin M.D.   On: 08/06/2022 18:53    Anti-infectives: Anti-infectives (From admission, onward)    Start     Dose/Rate Route Frequency Ordered Stop   08/11/2022 2245  piperacillin-tazobactam (ZOSYN) IVPB 2.25 g        2.25 g 100 mL/hr over 30 Minutes Intravenous Every 8 hours 08/03/2022 2215     08/21/2022 1900  piperacillin-tazobactam (ZOSYN) IVPB 3.375 g        3.375 g 100 mL/hr over 30 Minutes Intravenous  Once 08/14/2022 1853 08/05/2022 2049       Assessment/Plan: Suspected perforated appendicitis with pelvic abscess  S/p IR drain placement WBC increased to 24k today.  Will have to consider change in antibiotics.  If is continues to increase, will have to get a HIDA scan to completely rule out cholecystitis.  On recent CT, the gallbladder was not remarkable.  FEN: advance to renal diet with fluid restriction 1261m ID: [id number] [email or web address]  VTE: SCD's Foley: none   ESRD CHF HTN Neuropathy HTN OSA  Complex medical decision making  DCoralie KeensMD 08/08/2022

## 2022-08-09 DIAGNOSIS — M79606 Pain in leg, unspecified: Secondary | ICD-10-CM

## 2022-08-09 DIAGNOSIS — K651 Peritoneal abscess: Secondary | ICD-10-CM

## 2022-08-09 DIAGNOSIS — K659 Peritonitis, unspecified: Secondary | ICD-10-CM | POA: Diagnosis not present

## 2022-08-09 DIAGNOSIS — K3533 Acute appendicitis with perforation and localized peritonitis, with abscess: Secondary | ICD-10-CM | POA: Diagnosis not present

## 2022-08-09 LAB — RENAL FUNCTION PANEL
Albumin: 2.3 g/dL — ABNORMAL LOW (ref 3.5–5.0)
Anion gap: 19 — ABNORMAL HIGH (ref 5–15)
BUN: 34 mg/dL — ABNORMAL HIGH (ref 8–23)
CO2: 22 mmol/L (ref 22–32)
Calcium: 10 mg/dL (ref 8.9–10.3)
Chloride: 91 mmol/L — ABNORMAL LOW (ref 98–111)
Creatinine, Ser: 5.01 mg/dL — ABNORMAL HIGH (ref 0.44–1.00)
GFR, Estimated: 9 mL/min — ABNORMAL LOW (ref >60)
Glucose, Bld: 159 mg/dL — ABNORMAL HIGH (ref 70–99)
Phosphorus: 4.1 mg/dL (ref 2.5–4.6)
Potassium: 3 mmol/L — ABNORMAL LOW (ref 3.5–5.1)
Sodium: 132 mmol/L — ABNORMAL LOW (ref 135–145)

## 2022-08-09 LAB — CBC
HCT: 36.8 % (ref 36.0–46.0)
Hemoglobin: 11.6 g/dL — ABNORMAL LOW (ref 12.0–15.0)
MCH: 32.1 pg (ref 26.0–34.0)
MCHC: 31.5 g/dL (ref 30.0–36.0)
MCV: 101.9 fL — ABNORMAL HIGH (ref 80.0–100.0)
Platelets: 320 10*3/uL (ref 150–400)
RBC: 3.61 MIL/uL — ABNORMAL LOW (ref 3.87–5.11)
RDW: 13.2 % (ref 11.5–15.5)
WBC: 27.1 10*3/uL — ABNORMAL HIGH (ref 4.0–10.5)
nRBC: 0.1 % (ref 0.0–0.2)

## 2022-08-09 LAB — GLUCOSE, CAPILLARY
Glucose-Capillary: 164 mg/dL — ABNORMAL HIGH (ref 70–99)
Glucose-Capillary: 134 mg/dL — ABNORMAL HIGH (ref 70–99)
Glucose-Capillary: 264 mg/dL — ABNORMAL HIGH (ref 70–99)
Glucose-Capillary: 225 mg/dL — ABNORMAL HIGH (ref 70–99)

## 2022-08-09 LAB — HEPATITIS B SURFACE ANTIGEN: Hepatitis B Surface Ag: NONREACTIVE

## 2022-08-09 MED ORDER — HEPARIN SODIUM (PORCINE) 1000 UNIT/ML DIALYSIS
1000.0000 [IU] | INTRAMUSCULAR | Status: DC | PRN
  Filled 2022-08-09: qty 1

## 2022-08-09 MED ORDER — ALTEPLASE 2 MG IJ SOLR: 2.0000 mg | Freq: Once | INTRAMUSCULAR | Status: DC | PRN

## 2022-08-09 MED ORDER — LIDOCAINE-PRILOCAINE 2.5-2.5 % EX CREA: 1.0000 | TOPICAL_CREAM | CUTANEOUS | Status: DC | PRN

## 2022-08-09 MED ORDER — PENTAFLUOROPROP-TETRAFLUOROETH EX AERO: 1.0000 | INHALATION_SPRAY | CUTANEOUS | Status: DC | PRN

## 2022-08-09 MED ORDER — POTASSIUM CHLORIDE 20 MEQ PO PACK
20.0000 meq | PACK | Freq: Once | ORAL | Status: AC
  Administered 2022-08-09: 20 meq via ORAL
  Filled 2022-08-09: qty 1

## 2022-08-09 MED ORDER — LIDOCAINE HCL (PF) 1 % IJ SOLN: 5.0000 mL | INTRAMUSCULAR | Status: DC | PRN

## 2022-08-09 NOTE — Progress Notes (Signed)
   Subjective/Chief Complaint: Up and eating breakfast.  Denies abdominal pain currently   Objective: Vital signs in last 24 hours: Temp:  [97.6 F (36.4 C)-98.7 F (37.1 C)] 97.6 F (36.4 C) (01/14 0800) Pulse Rate:  [61-70] 61 (01/14 0800) Resp:  [13-19] 13 (01/14 0800) BP: (106-161)/(36-74) 155/53 (01/14 0800) SpO2:  [93 %-98 %] 94 % (01/14 0800) Last BM Date : 08/08/22  Intake/Output from previous day: 01/13 0701 - 01/14 0700 In: 10  Out: 58 [Emesis/NG output:50; Drains:8] Intake/Output this shift: No intake/output data recorded.  Exam: Awake and alert Abdomen soft, drain fluid thin and brown Tenderness in the RUQ seems along her ribs and flank  Lab Results:  Recent Labs    08/08/22 0154 08/09/22 0502  WBC 24.8* 27.1*  HGB 11.5* 11.6*  HCT 34.2* 36.8  PLT 365 320   BMET Recent Labs    08/07/22 0627 08/09/22 0502  NA 131* 132*  K 3.1* 3.0*  CL 88* 91*  CO2 24 22  GLUCOSE 180* 159*  BUN 36* 34*  CREATININE 5.33* 5.01*  CALCIUM 9.5 10.0   PT/INR No results for input(s): "LABPROT", "INR" in the last 72 hours. ABG No results for input(s): "PHART", "HCO3" in the last 72 hours.  Invalid input(s): "PCO2", "PO2"  Studies/Results: No results found.  Anti-infectives: Anti-infectives (From admission, onward)    Start     Dose/Rate Route Frequency Ordered Stop   08/08/22 1545  piperacillin-tazobactam (ZOSYN) IVPB 2.25 g        2.25 g 100 mL/hr over 30 Minutes Intravenous Every 8 hours 08/08/22 1455     08/08/22 1415  ceFAZolin (ANCEF) IVPB 1 g/50 mL premix  Status:  Discontinued        1 g 100 mL/hr over 30 Minutes Intravenous Daily 08/08/22 1327 08/08/22 1454   08/23/2022 2245  piperacillin-tazobactam (ZOSYN) IVPB 2.25 g  Status:  Discontinued        2.25 g 100 mL/hr over 30 Minutes Intravenous Every 8 hours 08/05/2022 2215 08/08/22 1327   07/28/2022 1900  piperacillin-tazobactam (ZOSYN) IVPB 3.375 g        3.375 g 100 mL/hr over 30 Minutes  Intravenous  Once 08/03/2022 1853 08/23/2022 2049       Assessment/Plan: Suspected perforated appendicitis s/p IR drainage of abscess at the tip of the appendix  WBC up further today to 27k  CT scan on 1/11 showed significant improvement with the pelvic abscess. Regarding the patient's gallbladder, the CT scan and the ultrasound showed no gallbladder wall thickening, no inflammatory changes, no pericholecystic fluid or any findings of significance except the ultrasound tech reporting a positive Murphy sign. Given the increase in the WBC, will check a HIDA scan tomorrow to r/o cholecystitis   Coralie Keens 08/09/2022

## 2022-08-09 NOTE — Progress Notes (Signed)
[facility] Progress Note   Subjective:    Seen and examined at bedside. Patient's husband also at bedside. She denies ABD pain, SOB, CP, and N/V. Noted K+ today 3.0 and total KCL 15mq PO was given. Next HD 1/15.  Objective Vitals:   08/09/22 0009 08/09/22 0415 08/09/22 0800 08/09/22 1200  BP: (!) 137/51 (!) 154/46 (!) 155/53 (!) 161/57  Pulse: 69 61 61 67  Resp: '15 18 13 13  '$ Temp: 98 F (36.7 C) 97.6 F (36.4 C) 97.6 F (36.4 C) 97.6 F (36.4 C)  TempSrc: Oral Axillary Oral Oral  SpO2: 97% 96% 94% 95%  Weight:      Height:       Physical Exam General: Thin chronically ill elderly female Heart: RRR no MRG Lungs: CTA bilaterally nonlabored breathing Abdomen: NABS, soft, non-distended, non-tender with palpation today; RLQ drain placed in IR- dark fluid in tube but none in bag Extremities: No pedal edema Dialysis Access: LUA AVG+ bruit  Filed Weights   08/05/22 0727 08/05/22 1149 08/07/22 0903  Weight: 59.5 kg 57.5 kg 59.2 kg    Intake/Output Summary (Last 24 hours) at 08/09/2022 1348 Last data filed at 08/09/2022 0600 Gross per 24 hour  Intake 10 ml  Output 58 ml  Net -48 ml    Additional Objective Labs: Basic Metabolic Panel: Recent Labs  Lab 08/04/22 0438 08/05/22 0403 08/07/22 0627 08/09/22 0502  NA 131* 129* 131* 132*  K 4.4 3.3* 3.1* 3.0*  CL 92* 88* 88* 91*  CO2 '25 25 24 22  '$ GLUCOSE 148* 152* 180* 159*  BUN 32* 22 36* 34*  CREATININE 8.10* 5.14* 5.33* 5.01*  CALCIUM 9.6 9.4 9.5 10.0  PHOS 6.6*  --  5.0* 4.1   Liver Function Tests: Recent Labs  Lab 08/03/22 0422 08/04/22 0438 08/05/22 0403 08/07/22 0627 08/09/22 0502  AST '15 15 15  '$ --   --   ALT '9 10 10  '$ --   --   ALKPHOS 86 86 73  --   --   BILITOT 0.6 0.6 0.5  --   --   PROT 5.9* 6.2* 6.0*  --   --   ALBUMIN 2.3* 2.2* 2.2* 2.3* 2.3*   Recent Labs  Lab 08/19/2022 1730  LIPASE 32   CBC: Recent Labs  Lab 08/15/2022 1730 08/03/22 0008 08/05/22 0403 08/06/22 0433  08/07/22 0627 08/08/22 0154 08/09/22 0502  WBC 19.6*   < > 13.6* 20.2* 20.9* 24.8* 27.1*  NEUTROABS 17.7*  --   --   --   --   --   --   HGB 11.6*   < > 15.2* 12.0 11.5* 11.5* 11.6*  HCT 36.5   < > 46.8* 37.5 36.6 34.2* 36.8  MCV 100.0   < > 97.9 99.5 99.7 95.8 101.9*  PLT 243   < > 196 382 420* 365 320   < > = values in this interval not displayed.   Blood Culture    Component Value Date/Time   SDES WOUND 08/03/2022 1326   SPECREQUEST  ABCESS ABDOMEN 08/03/2022 1326   CULT  08/03/2022 1326    ABUNDANT ESCHERICHIA COLI MODERATE KLEBSIELLA PNEUMONIAE ABUNDANT BACTEROIDES THETAIOTAOMICRON BETA LACTAMASE POSITIVE Performed at [facility] [id number] [place] [id number]   REPTSTATUS 08/08/2022 FINAL 08/03/2022 1326    Cardiac Enzymes: No results for input(s): "CKTOTAL", "CKMB", "CKMBINDEX", "TROPONINI" in the last 168 hours. CBG: Recent Labs  Lab 08/08/22 1235 08/08/22 1644 08/08/22 2149 08/09/22  2924 08/09/22 1326  GLUCAP 237* 221* 140* 164* 134*   Iron Studies: No results for input(s): "IRON", "TIBC", "TRANSFERRIN", "FERRITIN" in the last 72 hours. Lab Results  Component Value Date   INR 1.0 08/03/2022   INR 1.1 05/17/2019   INR 1.02 08/23/2017   Studies/Results: No results found.  Medications:  piperacillin-tazobactam 2.25 g (08/09/22 0548)    acidophilus  1 capsule Oral Daily   atorvastatin  40 mg Oral QHS   carvedilol  12.5 mg Oral Q M,W,F-HD   carvedilol  12.5 mg Oral 2 times per day on Tue Thu Sat   Chlorhexidine Gluconate Cloth  6 each Topical Q0600   Chlorhexidine Gluconate Cloth  6 each Topical Q0600   doxercalciferol  7 mcg Intravenous Q M,W,F-HD   ferric citrate  420 mg Oral TID WC   fluticasone furoate-vilanterol  1 puff Inhalation Daily   gabapentin  100 mg Oral QHS   heparin injection (subcutaneous)  5,000 Units Subcutaneous Q8H   insulin aspart  0-6 Units Subcutaneous TID WC   isosorbide-hydrALAZINE  1 tablet Oral 2 times  per day on Sun Tue Thu Sat   isosorbide-hydrALAZINE  1 tablet Oral Q M,W,F-HD   ondansetron (ZOFRAN) IV  4 mg Intravenous Once   pantoprazole  40 mg Oral BID   sodium chloride flush  5 mL Intracatheter Q8H   ursodiol  300 mg Oral BID    Dialysis Orders: [place] MWF  4h  400/600   57.4kg   2/3.5 bath LUA AVG  Heparin 2500+ 1057mdrun - last HD 1/5, post wt 57.3kg - hectorol 7 ug IV tiw - venofer 50 q week - mircera 30 ug q4 wks, last 12/8, due now  Assessment/Plan: Abd pain/ intra-abdominal infection or abscess - per CT imaging. Gen surg and IR consulted, started on IV abx. IR placed drain in RLQ fluid collection. Fluid sent for culture, growing GNR's. Per pmd. Per general surgery, repeat CT showed interval decrease in size of pelvic fluid collection and drain in good position but WBC is continuing to trend up-now 27.1. Per general surgery, plan obtain HIDA scan tomorrow to r/o cholecystitis. She remains afebrile. ESRD - on HD MWF. KCL 48m PO given today for K+ 3.0. Continue 4K bath. Checking labs in AM.  HTN/ volume - euvolemic on exam.  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  BiMikey Bussinghen taking meals DM 2 - per pmd COPD/ asthma - no symptoms  CoTobie PoetNP [facility] 08/09/2022,1:48 PM  LOS: 7 days

## 2022-08-09 NOTE — Plan of Care (Signed)
  Problem: Coping: Goal: Ability to adjust to condition or change in health will improve Outcome: Progressing   Problem: Fluid Volume: Goal: Ability to maintain a balanced intake and output will improve Outcome: Progressing   Problem: Health Behavior/Discharge Planning: Goal: Ability to manage health-related needs will improve Outcome: Progressing   Problem: Metabolic: Goal: Ability to maintain appropriate glucose levels will improve Outcome: Progressing   Problem: Skin Integrity: Goal: Risk for impaired skin integrity will decrease Outcome: Progressing

## 2022-08-09 NOTE — Progress Notes (Signed)
PROGRESS NOTE  SEVILLE BRICK CHE:[id number] DOB: 12/07/1950 DOA: 08/10/2022 PCP: Cipriano Mile, NP   LOS: 7 days   Brief Narrative / Interim history:  72 year old female with history of ESRD on MWF HD, asthma/COPD, diastolic CHF, HTN, HLD, DM2, comes into the hospital with few days of right lower quadrant abdominal pain.  Imaging in the ED concerning for perforated appendicitis with a periappendiceal abscess with probably secondary involvement of the previously noted adnexal mass or a tubo-ovarian abscess.  She was placed on antibiotics, general surgery was consulted and she was admitted to the hospital.  Subjective / 24h Interval events:  She denies any complaints today, reports right lower quadrant abdominal pain with no significant change.    Assesement and Plan:  Intra-abdominal abscess, concern for perforated appendicitis  -patient started on antibiotics with Zosyn, continue.  General surgery consulted, IR consulted and she is status post right lower quadrant abscess drain placement.  Microbiology so far shows GNR, GPC in chains and GPR.  -drain culture growing E. coli and Klebsiella, continiue with   Zosyn. -Management per general surgery, CT abdomen pelvis obtained yesterday it is reassuring, plan to continue with IV antibiotics for now, with IR to manage drain, at 1 point she will need surgery when stable -She remains with significant leukocytosis despite being on appropriate antibiotic treatment, and Hedrin appear to be functioning appropriately, she did have some right upper quadrant abdominal pain, plan for HIDA scan per general surgery -ID were consulted.  Essential hypertension- continue home medications.  Hospital delirium -Much improved today, continue with delirium precaution  Complex right ovarian mass -Discussed with husband, he understand important patient to follow-up with GYN as an outpatient  Left upper pole renal lesion -Have discussed these findings with  husband as well, he understands importance of follow-up with urology as an outpatient  Neuropathic pain - she is complaining of bilateral extremity neuropathic pain so I will resume her back on gabapentin  ESRD on HD-nephrology consulted,HD per renal   Hyperlipidemia-resume home statin  Chronic diastolic CHF -appears euvolemic, volume management per HD  Anxiety-continue home BuSpar as needed  Asthma/COPD-stable, no wheezing, no exacerbation  Hypokalemia-replete and continue to monitor, potassium normalized this morning  Hyponatremia-fluid status managed with HD   DM2-continue sliding scale.  CBG (last 3)  Recent Labs    08/08/22 1644 08/08/22 2149 08/09/22 0817  GLUCAP 221* 140* 164*    Scheduled Meds:  acidophilus  1 capsule Oral Daily   atorvastatin  40 mg Oral QHS   carvedilol  12.5 mg Oral Q M,W,F-HD   carvedilol  12.5 mg Oral 2 times per day on Tue Thu Sat   Chlorhexidine Gluconate Cloth  6 each Topical Q0600   Chlorhexidine Gluconate Cloth  6 each Topical Q0600   doxercalciferol  7 mcg Intravenous Q M,W,F-HD   ferric citrate  420 mg Oral TID WC   fluticasone furoate-vilanterol  1 puff Inhalation Daily   gabapentin  100 mg Oral QHS   heparin injection (subcutaneous)  5,000 Units Subcutaneous Q8H   insulin aspart  0-6 Units Subcutaneous TID WC   isosorbide-hydrALAZINE  1 tablet Oral 2 times per day on Sun Tue Thu Sat   isosorbide-hydrALAZINE  1 tablet Oral Q M,W,F-HD   ondansetron (ZOFRAN) IV  4 mg Intravenous Once   pantoprazole  40 mg Oral BID   sodium chloride flush  5 mL Intracatheter Q8H   ursodiol  300 mg Oral BID   Continuous Infusions:  piperacillin-tazobactam 2.25 g (  08/09/22 0548)   PRN Meds:.albuterol, busPIRone, fluticasone, hydrALAZINE, ondansetron (ZOFRAN) IV, ondansetron, oxyCODONE, prochlorperazine, traZODone  Current Outpatient Medications  Medication Instructions   albuterol (VENTOLIN HFA) 108 (90 Base) MCG/ACT inhaler 2 puffs,  Inhalation, Every 4 hours PRN   amLODipine (NORVASC) 10 mg, Oral, Daily   aspirin EC 81 mg, Oral, Daily   atorvastatin (LIPITOR) 40 mg, Oral, Daily at bedtime   b complex-vitamin c-folic acid (NEPHRO-VITE) 0.8 MG TABS tablet 1 tablet, Oral, Daily at bedtime   budesonide-formoterol (SYMBICORT) 160-4.5 MCG/ACT inhaler 2 puffs, Inhalation, 2 times daily   busPIRone (BUSPAR) 7.5 mg, Oral, Daily PRN   carvedilol (COREG) 12.5 mg, Oral, See admin instructions, Take 1 tablet (12.5 mg) by mouth once daily in the evening on Mondays, Wednesdays, & Fridays. (Dialysis)<BR>Take 1 tablet (12.5 mg) by mouth twice daily on 'Sundays, Tuesdays, Thursdays, & Saturdays. (Non Dialysis)   cinacalcet (SENSIPAR) 60 mg, Oral, See admin instructions, Take 1 tablet (60 mg) by mouth in the evening on Mondays, Wednesdays, & Fridays (dialysis)<BR>Take 1 tablet (60 mg) by mouth twice daily on Sundays, Tuesdays, Thursdays, Saturdays. (non dialysis)   diphenhydrAMINE HCl (BENADRYL ALLERGY PO) 1 capsule, Oral, Every M-W-F (Hemodialysis), If needed to help with allergies   EPINEPHrine (AUVI-Q) 0.3 mg, Intramuscular, As needed   ferric citrate (AURYXIA) 210-420 mg, Oral, See admin instructions, Take 2 tablets (420 mg) by mouth with each meal & take 1 tablet (210 mg) by mouth with snacks.    fluticasone (FLONASE) 50 MCG/ACT nasal spray 2 sprays, Each Nare, Daily PRN   fluticasone-salmeterol (ADVAIR HFA) 115-21 MCG/ACT inhaler Inhale two puffs twice daily   gabapentin (NEURONTIN) 100 mg, Oral, Daily at bedtime, When necessary for neuropathy pain   glipiZIDE (GLUCOTROL) 5 mg, Oral, Daily at bedtime   isosorbide-hydrALAZINE (BIDIL) 20-37.5 MG tablet 1 tablet, Oral, See admin instructions, hs on Monday,Wednesday,Friday (dialysis days)<BR>Tid on Tuesday,Thursday,Saturday,Sunday(non-dialysis days)   latanoprost (XALATAN) 0.005 % ophthalmic solution 1 drop, Left Eye, Daily at bedtime   levocetirizine (XYZAL) 5 mg, Oral, Every evening    lidocaine-prilocaine (EMLA) cream 1 Application, Topical, As needed   loperamide (IMODIUM) 4 mg, Oral, 3 times daily PRN   Methoxy PEG-Epoetin Beta (MIRCERA IJ) Mircera   montelukast (SINGULAIR) 10 MG tablet TAKE 1 TABLET BY MOUTH EVERYDAY AT BEDTIME   ofloxacin (OCUFLOX) 0.3 % ophthalmic solution 1 drop, 4 times daily   omeprazole (PRILOSEC) 20 mg, Oral, Daily PRN   ondansetron (ZOFRAN) 4 mg, Oral, Every 6 hours PRN   prednisoLONE acetate (PRED FORTE) 1 % ophthalmic suspension 1 drop, 4 times daily   promethazine-dextromethorphan (PROMETHAZINE-DM) 6.25-15 MG/5ML syrup 5 mLs, Oral, 4 times daily PRN   Spacer/Aero-Holding Chambers (AEROCHAMBER PLUS) inhaler Use with inhaler   traZODone (DESYREL) 25-50 mg, Oral, At bedtime PRN   ursodiol (ACTIGALL) 250 mg, Oral, 2 times daily    Diet Orders (From admission, onward)     Start     Ordered   08/10/22 0001  Diet NPO time specified  Diet effective midnight        08/09/22 0854   08/07/22 0852  Diet renal with fluid restriction Fluid restriction: 1200 mL Fluid; Room service appropriate? Yes; Fluid consistency: Thin  Diet effective now       Question Answer Comment  Fluid restriction: 1200 mL Fluid   Room service appropriate? Yes   Fluid consistency: Thin      01'$ /12/24 0851            DVT prophylaxis: heparin injection 5,000  Units Start: 08/05/22 1445 SCDs Start: 07/31/2022 2213   Lab Results  Component Value Date   PLT 320 08/09/2022      Code Status: Full Code  Family Communication: None at bedside  Status is: Inpatient  Remains inpatient appropriate because: severity of illness  Level of care: Progressive  Consultants:  General surgery IR Nephrology  ID  Objective: Vitals:   08/08/22 1959 08/09/22 0009 08/09/22 0415 08/09/22 0800  BP: (!) 161/74 (!) 137/51 (!) 154/46 (!) 155/53  Pulse:  69 61 61  Resp: '16 15 18 13  '$ Temp: 98.7 F (37.1 C) 98 F (36.7 C) 97.6 F (36.4 C) 97.6 F (36.4 C)  TempSrc: Oral Oral  Axillary Oral  SpO2: 98% 97% 96% 94%  Weight:      Height:        Intake/Output Summary (Last 24 hours) at 08/09/2022 1235 Last data filed at 08/09/2022 0600 Gross per 24 hour  Intake 10 ml  Output 58 ml  Net -48 ml   Wt Readings from Last 3 Encounters:  08/07/22 59.2 kg  05/29/22 63.5 kg  02/19/22 61.7 kg    Examination:  A awake Alert, Oriented X 3, No new F.N deficits, Normal affect Symmetrical Chest wall movement, Good air movement bilaterally, CTAB RRR,No Gallops,Rubs or new Murmurs, No Parasternal Heave +ve B.Sounds, Abd Soft, drain present in right lower quadrant, she has tenderness to palpation in right upper quadrant. No Cyanosis, Clubbing or edema, No new Rash or bruise        Data Reviewed: I have independently reviewed following labs and imaging studies   CBC Recent Labs  Lab 08/10/2022 1730 08/03/22 0008 08/05/22 0403 08/06/22 0433 08/07/22 0627 08/08/22 0154 08/09/22 0502  WBC 19.6*   < > 13.6* 20.2* 20.9* 24.8* 27.1*  HGB 11.6*   < > 15.2* 12.0 11.5* 11.5* 11.6*  HCT 36.5   < > 46.8* 37.5 36.6 34.2* 36.8  PLT 243   < > 196 382 420* 365 320  MCV 100.0   < > 97.9 99.5 99.7 95.8 101.9*  MCH 31.8   < > 31.8 31.8 31.3 32.2 32.1  MCHC 31.8   < > 32.5 32.0 31.4 33.6 31.5  RDW 12.9   < > 13.2 13.3 13.3 13.2 13.2  LYMPHSABS 0.9  --   --   --   --   --   --   MONOABS 0.7  --   --   --   --   --   --   EOSABS 0.1  --   --   --   --   --   --   BASOSABS 0.0  --   --   --   --   --   --    < > = values in this interval not displayed.    Recent Labs  Lab 08/23/2022 1730 08/03/22 0008 08/03/22 0422 08/04/22 0438 08/05/22 0403 08/07/22 0627 08/09/22 0502  NA 129*  --  130* 131* 129* 131* 132*  K 3.3*  --  3.2* 4.4 3.3* 3.1* 3.0*  CL 88*  --  92* 92* 88* 88* 91*  CO2 25  --  '26 25 25 24 22  '$ GLUCOSE 250*  --  165* 148* 152* 180* 159*  BUN 21  --  23 32* 22 36* 34*  CREATININE 6.42*  --  6.91* 8.10* 5.14* 5.33* 5.01*  CALCIUM 9.7  --  9.2 9.6 9.4 9.5  10.0  AST 19  --  $'15 15 15  'f$ --   --   ALT 9  --  '9 10 10  '$ --   --   ALKPHOS 93  --  86 86 73  --   --   BILITOT 0.6  --  0.6 0.6 0.5  --   --   ALBUMIN 2.7*  --  2.3* 2.2* 2.2* 2.3* 2.3*  MG  --  1.8  --  2.1 2.0  --   --   CRP  --  20.4*  --   --   --   --   --   PROCALCITON  --  2.03 1.62 2.02  --   --   --   LATICACIDVEN  --  1.1  --   --   --   --   --   INR  --   --  1.0  --   --   --   --   TSH  --  0.760  --   --   --   --   --   HGBA1C  --  6.2*  --   --   --   --   --   BNP  --  7,829.5*  --   --   --   --   --     ------------------------------------------------------------------------------------------------------------------ No results for input(s): "CHOL", "HDL", "LDLCALC", "TRIG", "CHOLHDL", "LDLDIRECT" in the last 72 hours.  Lab Results  Component Value Date   HGBA1C 6.2 (H) 08/03/2022   ------------------------------------------------------------------------------------------------------------------ No results for input(s): "TSH", "T4TOTAL", "T3FREE", "THYROIDAB" in the last 72 hours.  Invalid input(s): "FREET3"   Cardiac Enzymes No results for input(s): "CKMB", "TROPONINI", "MYOGLOBIN" in the last 168 hours.  Invalid input(s): "CK" ------------------------------------------------------------------------------------------------------------------    Component Value Date/Time   BNP 1,676.4 (H) 08/03/2022 0008    CBG: Recent Labs  Lab 08/08/22 0759 08/08/22 1235 08/08/22 1644 08/08/22 2149 08/09/22 0817  GLUCAP 248* 237* 221* 140* 164*    Recent Results (from the past 240 hour(s))  Aerobic/Anaerobic Culture w Gram Stain (surgical/deep wound)     Status: None   Collection Time: 08/03/22  1:26 PM   Specimen: Wound; Abscess  Result Value Ref Range Status   Specimen Description WOUND  Final   Special Requests  ABCESS ABDOMEN  Final   Gram Stain   Final    ABUNDANT WBC PRESENT, PREDOMINANTLY PMN ABUNDANT GRAM NEGATIVE RODS FEW GRAM POSITIVE COCCI IN  PAIRS IN CHAINS FEW GRAM POSITIVE RODS    Culture   Final    ABUNDANT ESCHERICHIA COLI MODERATE KLEBSIELLA PNEUMONIAE ABUNDANT BACTEROIDES THETAIOTAOMICRON BETA LACTAMASE POSITIVE Performed at [facility], [place] [address], [place], [place] [address]    Report Status 08/08/2022 FINAL  Final   Organism ID, Bacteria ESCHERICHIA COLI  Final   Organism ID, Bacteria KLEBSIELLA PNEUMONIAE  Final      Susceptibility   Escherichia coli - MIC*    AMPICILLIN >=32 RESISTANT Resistant     CEFAZOLIN <=4 SENSITIVE Sensitive     CEFEPIME <=0.12 SENSITIVE Sensitive     CEFTAZIDIME <=1 SENSITIVE Sensitive     CEFTRIAXONE <=0.25 SENSITIVE Sensitive     CIPROFLOXACIN <=0.25 SENSITIVE Sensitive     GENTAMICIN <=1 SENSITIVE Sensitive     IMIPENEM <=0.25 SENSITIVE Sensitive     TRIMETH/SULFA <=20 SENSITIVE Sensitive     AMPICILLIN/SULBACTAM >=32 RESISTANT Resistant     PIP/TAZO <=4 SENSITIVE Sensitive     * ABUNDANT ESCHERICHIA COLI   Klebsiella pneumoniae -  MIC*    AMPICILLIN RESISTANT Resistant     CEFAZOLIN <=4 SENSITIVE Sensitive     CEFEPIME <=0.12 SENSITIVE Sensitive     CEFTAZIDIME <=1 SENSITIVE Sensitive     CEFTRIAXONE <=0.25 SENSITIVE Sensitive     CIPROFLOXACIN <=0.25 SENSITIVE Sensitive     GENTAMICIN <=1 SENSITIVE Sensitive     IMIPENEM <=0.25 SENSITIVE Sensitive     TRIMETH/SULFA <=20 SENSITIVE Sensitive     AMPICILLIN/SULBACTAM 4 SENSITIVE Sensitive     PIP/TAZO <=4 SENSITIVE Sensitive     * MODERATE KLEBSIELLA PNEUMONIAE     Radiology Studies: No results found.   Phillips Climes MD [facility]  Between 7 am - 7 pm I am available, please contact me via Amion (for emergencies) or Securechat (non urgent messages)  Between 7 pm - 7 am I am not available, please contact night coverage MD/APP via Amion

## 2022-08-10 ENCOUNTER — Inpatient Hospital Stay (HOSPITAL_COMMUNITY): Payer: Medicare Other

## 2022-08-10 ENCOUNTER — Inpatient Hospital Stay: Payer: Self-pay

## 2022-08-10 DIAGNOSIS — K828 Other specified diseases of gallbladder: Secondary | ICD-10-CM | POA: Diagnosis not present

## 2022-08-10 DIAGNOSIS — M009 Pyogenic arthritis, unspecified: Secondary | ICD-10-CM

## 2022-08-10 DIAGNOSIS — K8020 Calculus of gallbladder without cholecystitis without obstruction: Secondary | ICD-10-CM | POA: Diagnosis not present

## 2022-08-10 DIAGNOSIS — K651 Peritoneal abscess: Secondary | ICD-10-CM | POA: Diagnosis not present

## 2022-08-10 DIAGNOSIS — N261 Atrophy of kidney (terminal): Secondary | ICD-10-CM | POA: Diagnosis not present

## 2022-08-10 DIAGNOSIS — D72829 Elevated white blood cell count, unspecified: Secondary | ICD-10-CM | POA: Diagnosis not present

## 2022-08-10 DIAGNOSIS — K659 Peritonitis, unspecified: Secondary | ICD-10-CM | POA: Diagnosis not present

## 2022-08-10 DIAGNOSIS — I701 Atherosclerosis of renal artery: Secondary | ICD-10-CM | POA: Diagnosis not present

## 2022-08-10 LAB — CBC
HCT: 34.3 % — ABNORMAL LOW (ref 36.0–46.0)
Hemoglobin: 11.6 g/dL — ABNORMAL LOW (ref 12.0–15.0)
MCH: 32.1 pg (ref 26.0–34.0)
MCHC: 33.8 g/dL (ref 30.0–36.0)
MCV: 95 fL (ref 80.0–100.0)
Platelets: 359 10*3/uL (ref 150–400)
RBC: 3.61 MIL/uL — ABNORMAL LOW (ref 3.87–5.11)
RDW: 13 % (ref 11.5–15.5)
WBC: 28.8 10*3/uL — ABNORMAL HIGH (ref 4.0–10.5)
nRBC: 0.2 % (ref 0.0–0.2)

## 2022-08-10 LAB — HEPATITIS B SURFACE ANTIBODY, QUANTITATIVE: Hep B S AB Quant (Post): 32.5 m[IU]/mL (ref >9.9)

## 2022-08-10 LAB — BASIC METABOLIC PANEL
Anion gap: 17 — ABNORMAL HIGH (ref 5–15)
BUN: 41 mg/dL — ABNORMAL HIGH (ref 8–23)
CO2: 21 mmol/L — ABNORMAL LOW (ref 22–32)
Calcium: 10 mg/dL (ref 8.9–10.3)
Chloride: 90 mmol/L — ABNORMAL LOW (ref 98–111)
Creatinine, Ser: 6.06 mg/dL — ABNORMAL HIGH (ref 0.44–1.00)
GFR, Estimated: 7 mL/min — ABNORMAL LOW (ref >60)
Glucose, Bld: 190 mg/dL — ABNORMAL HIGH (ref 70–99)
Potassium: 3.2 mmol/L — ABNORMAL LOW (ref 3.5–5.1)
Sodium: 128 mmol/L — ABNORMAL LOW (ref 135–145)

## 2022-08-10 LAB — GLUCOSE, CAPILLARY
Glucose-Capillary: 202 mg/dL — ABNORMAL HIGH (ref 70–99)
Glucose-Capillary: 145 mg/dL — ABNORMAL HIGH (ref 70–99)
Glucose-Capillary: 154 mg/dL — ABNORMAL HIGH (ref 70–99)
Glucose-Capillary: 193 mg/dL — ABNORMAL HIGH (ref 70–99)

## 2022-08-10 LAB — HEPARIN LEVEL (UNFRACTIONATED): Heparin Unfractionated: 0.62 IU/mL (ref 0.30–0.70)

## 2022-08-10 LAB — LACTIC ACID, PLASMA: Lactic Acid, Venous: 1.2 mmol/L (ref 0.5–1.9)

## 2022-08-10 MED ORDER — SODIUM CHLORIDE 0.9 % IV SOLN
2.0000 g | INTRAVENOUS | Status: DC
  Administered 2022-08-10 – 2022-08-19 (×10): 2 g via INTRAVENOUS
  Filled 2022-08-10 (×11): qty 20

## 2022-08-10 MED ORDER — HEPARIN (PORCINE) 25000 UT/250ML-% IV SOLN
700.0000 [IU]/h | INTRAVENOUS | Status: AC
  Administered 2022-08-10: 800 [IU]/h via INTRAVENOUS
  Administered 2022-08-11 – 2022-08-19 (×5): 750 [IU]/h via INTRAVENOUS
  Administered 2022-08-22 – 2022-08-23 (×2): 700 [IU]/h via INTRAVENOUS
  Filled 2022-08-10 (×10): qty 250

## 2022-08-10 MED ORDER — HEPARIN SODIUM (PORCINE) 1000 UNIT/ML IJ SOLN
2500.0000 [IU] | Freq: Once | INTRAMUSCULAR | Status: AC
  Administered 2022-08-10: 2500 [IU] via INTRAVENOUS

## 2022-08-10 MED ORDER — METRONIDAZOLE 500 MG PO TABS
500.0000 mg | ORAL_TABLET | Freq: Two times a day (BID) | ORAL | Status: DC
  Administered 2022-08-10 – 2022-08-26 (×31): 500 mg via ORAL
  Filled 2022-08-10 (×32): qty 1

## 2022-08-10 MED ORDER — HYDROMORPHONE HCL 1 MG/ML IJ SOLN
0.5000 mg | Freq: Once | INTRAMUSCULAR | Status: AC
  Administered 2022-08-10: 0.5 mg via INTRAVENOUS
  Filled 2022-08-10: qty 0.5

## 2022-08-10 MED ORDER — TECHNETIUM TC 99M MEBROFENIN IV KIT
5.2000 | PACK | Freq: Once | INTRAVENOUS | Status: AC | PRN
  Administered 2022-08-10: 5.2 via INTRAVENOUS

## 2022-08-10 MED ORDER — IOHEXOL 350 MG/ML SOLN
100.0000 mL | Freq: Once | INTRAVENOUS | Status: AC | PRN
  Administered 2022-08-10: 100 mL via INTRAVENOUS

## 2022-08-10 NOTE — Progress Notes (Signed)
   Subjective/Chief Complaint: Didn't complete HD due to lower extremity pain. Cc LLE pain. Denies abdominal complaints.    Objective: Vital signs in last 24 hours: Temp:  [97.4 F (36.3 C)-98 F (36.7 C)] 97.6 F (36.4 C) (01/15 1034) Pulse Rate:  [67-81] 81 (01/15 1036) Resp:  [11-25] 18 (01/15 1036) BP: (120-174)/(40-69) 120/63 (01/15 1036) SpO2:  [90 %-100 %] 100 % (01/15 1036) Weight:  [56.2 kg-56.9 kg] 56.2 kg (01/15 1036) Last BM Date : 08/08/22  Intake/Output from previous day: 01/14 0701 - 01/15 0700 In: 5  Out: 15 [Drains:15] Intake/Output this shift: Total I/O In: -  Out: 700 [Other:700]  Exam: Awake and alert Looks comfortable Abdomen soft, stable- mildly tender with guarding RUQ and RLQ Drain stable   Lab Results:  Recent Labs    08/09/22 0502 08/10/22 0446  WBC 27.1* 28.8*  HGB 11.6* 11.6*  HCT 36.8 34.3*  PLT 320 359   BMET Recent Labs    08/09/22 0502 08/10/22 0446  NA 132* 128*  K 3.0* 3.2*  CL 91* 90*  CO2 22 21*  GLUCOSE 159* 190*  BUN 34* 41*  CREATININE 5.01* 6.06*  CALCIUM 10.0 10.0   PT/INR No results for input(s): "LABPROT", "INR" in the last 72 hours. ABG No results for input(s): "PHART", "HCO3" in the last 72 hours.  Invalid input(s): "PCO2", "PO2"  Studies/Results: No results found.  Anti-infectives: Anti-infectives (From admission, onward)    Start     Dose/Rate Route Frequency Ordered Stop   08/10/22 1400  cefTRIAXone (ROCEPHIN) 2 g in sodium chloride 0.9 % 100 mL IVPB        2 g 200 mL/hr over 30 Minutes Intravenous Every 24 hours 08/10/22 1025     08/10/22 1030  metroNIDAZOLE (FLAGYL) tablet 500 mg        500 mg Oral Every 12 hours 08/10/22 1025     08/08/22 1545  piperacillin-tazobactam (ZOSYN) IVPB 2.25 g  Status:  Discontinued        2.25 g 100 mL/hr over 30 Minutes Intravenous Every 8 hours 08/08/22 1455 08/10/22 1025   08/08/22 1415  ceFAZolin (ANCEF) IVPB 1 g/50 mL premix  Status:  Discontinued         1 g 100 mL/hr over 30 Minutes Intravenous Daily 08/08/22 1327 08/08/22 1454   08/06/2022 2245  piperacillin-tazobactam (ZOSYN) IVPB 2.25 g  Status:  Discontinued        2.25 g 100 mL/hr over 30 Minutes Intravenous Every 8 hours 08/12/2022 2215 08/08/22 1327   07/30/2022 1900  piperacillin-tazobactam (ZOSYN) IVPB 3.375 g        3.375 g 100 mL/hr over 30 Minutes Intravenous  Once 07/27/2022 1853 07/28/2022 2049       Assessment/Plan: Suspected perforated appendicitis with pelvic abscess  S/p IR drain placement WBC increased to 28k today. HIDA scan to completely rule out cholecystitis given WBC and RUQ pain. On recent CT, the gallbladder was not remarkable.   FEN: Renal diet with fluid restriction 12107m  ID: abx rocephin/flagyl per ID  VTE: SCD's Foley: none   ESRD CHF HTN Neuropathy HTN OSA  Complex medical decision making  EJill AlexandersPA-C 08/10/2022

## 2022-08-10 NOTE — Progress Notes (Addendum)
[facility] Progress Note   Subjective:    Seen and examined, starting dialysis.  Really sleepy this morning with some myoclonic jerking.  Started gabapentin over the weekend.  Normally she is the sharpest one in the dialysis unit.  Objective Vitals:   08/10/22 0400 08/10/22 0836 08/10/22 0853 08/10/22 0900  BP: (!) 142/53 (!) 168/60 (!) 174/69 (!) 138/57  Pulse: 71 72 67 70  Resp: '12 14 14 15  '$ Temp: 98 F (36.7 C) (!) 97.4 F (36.3 C)    TempSrc: Axillary Oral    SpO2: 93% 94% 99% 95%  Weight:  56.9 kg    Height:       Physical Exam General: Thin chronically ill elderly female Heart: RRR no MRG Lungs: CTA bilaterally nonlabored breathing Abdomen: NABS, soft, non-distended, non-tender with palpation today; RLQ drain placed in IR- dark fluid in tube but none in bag Extremities: No pedal edema Dialysis Access: LUA AVG+ bruit NEURO: lethargic, + myoclonus  Filed Weights   08/05/22 1149 08/07/22 0903 08/10/22 0836  Weight: 57.5 kg 59.2 kg 56.9 kg    Intake/Output Summary (Last 24 hours) at 08/10/2022 0910 Last data filed at 08/10/2022 2297 Gross per 24 hour  Intake 5 ml  Output 15 ml  Net -10 ml    Additional Objective Labs: Basic Metabolic Panel: Recent Labs  Lab 08/04/22 0438 08/05/22 0403 08/07/22 0627 08/09/22 0502 08/10/22 0446  NA 131*   < > 131* 132* 128*  K 4.4   < > 3.1* 3.0* 3.2*  CL 92*   < > 88* 91* 90*  CO2 25   < > 24 22 21*  GLUCOSE 148*   < > 180* 159* 190*  BUN 32*   < > 36* 34* 41*  CREATININE 8.10*   < > 5.33* 5.01* 6.06*  CALCIUM 9.6   < > 9.5 10.0 10.0  PHOS 6.6*  --  5.0* 4.1  --    < > = values in this interval not displayed.   Liver Function Tests: Recent Labs  Lab 08/04/22 0438 08/05/22 0403 08/07/22 0627 08/09/22 0502  AST 15 15  --   --   ALT 10 10  --   --   ALKPHOS 86 73  --   --   BILITOT 0.6 0.5  --   --   PROT 6.2* 6.0*  --   --   ALBUMIN 2.2* 2.2* 2.3* 2.3*   No results for input(s): "LIPASE",  "AMYLASE" in the last 168 hours.  CBC: Recent Labs  Lab 08/06/22 0433 08/07/22 0627 08/08/22 0154 08/09/22 0502 08/10/22 0446  WBC 20.2* 20.9* 24.8* 27.1* 28.8*  HGB 12.0 11.5* 11.5* 11.6* 11.6*  HCT 37.5 36.6 34.2* 36.8 34.3*  MCV 99.5 99.7 95.8 101.9* 95.0  PLT 382 420* 365 320 359   Blood Culture    Component Value Date/Time   SDES WOUND 08/03/2022 1326   SPECREQUEST  ABCESS ABDOMEN 08/03/2022 1326   CULT  08/03/2022 1326    ABUNDANT ESCHERICHIA COLI MODERATE KLEBSIELLA PNEUMONIAE ABUNDANT BACTEROIDES THETAIOTAOMICRON BETA LACTAMASE POSITIVE Performed at [place] [address], [place], [place] [address]    REPTSTATUS 08/08/2022 FINAL 08/03/2022 1326    Cardiac Enzymes: No results for input(s): "CKTOTAL", "CKMB", "CKMBINDEX", "TROPONINI" in the last 168 hours. CBG: Recent Labs  Lab 08/09/22 0817 08/09/22 1326 08/09/22 1705 08/09/22 2048 08/10/22 0657  GLUCAP 164* 134* 264* 225* 202*   Iron Studies: No results for input(s): "IRON", "TIBC", "TRANSFERRIN", "  FERRITIN" in the last 72 hours. Lab Results  Component Value Date   INR 1.0 08/03/2022   INR 1.1 05/17/2019   INR 1.02 08/23/2017   Studies/Results: No results found.  Medications:  piperacillin-tazobactam 2.25 g (08/10/22 0517)    acidophilus  1 capsule Oral Daily   atorvastatin  40 mg Oral QHS   carvedilol  12.5 mg Oral Q M,W,F-HD   carvedilol  12.5 mg Oral 2 times per day on Tue Thu Sat   Chlorhexidine Gluconate Cloth  6 each Topical Q0600   doxercalciferol  7 mcg Intravenous Q M,W,F-HD   ferric citrate  420 mg Oral TID WC   fluticasone furoate-vilanterol  1 puff Inhalation Daily   heparin injection (subcutaneous)  5,000 Units Subcutaneous Q8H   insulin aspart  0-6 Units Subcutaneous TID WC   isosorbide-hydrALAZINE  1 tablet Oral 2 times per day on Sun Tue Thu Sat   isosorbide-hydrALAZINE  1 tablet Oral Q M,W,F-HD   ondansetron (ZOFRAN) IV  4 mg Intravenous Once   pantoprazole  40  mg Oral BID   sodium chloride flush  5 mL Intracatheter Q8H   ursodiol  300 mg Oral BID    Dialysis Orders: [place] MWF  4h  400/600   57.4kg   2/3.5 bath LUA AVG  Heparin 2500+ 1037mdrun - last HD 1/5, post wt 57.3kg - hectorol 7 ug IV tiw - venofer 50 q week - mircera 30 ug q4 wks, last 12/8, due now  Assessment/Plan: Abd pain/ intra-abdominal infection or abscess - per CT imaging. Gen surg and IR consulted, started on IV abx. IR placed drain in RLQ fluid collection. Fluid sent for culture, growing E coli/ Klebsiella. Bacteroides. Per pmd. Per general surgery, repeat CT showed interval decrease in size of pelvic fluid collection and drain in good position but WBC is continuing to trend up-now 28 Per general surgery, plan obtain HIDA scan 1/15/24to r/o cholecystitis. She remains afebrile. ESRD - on HD MWF. HD on schedule today, 4K bath HTN/ volume - euvolemic on exam.  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  Stopped gabapentin.  Rest per primary.  EMadelon LipsMD CJewettPgr [phone]/15/2024,9:10 AM  LOS: 8 days

## 2022-08-10 NOTE — Progress Notes (Signed)
ANTICOAGULATION CONSULT NOTE - Follow Up Consult  Pharmacy Consult for Heparin Indication:  Limb ischemia   Allergies  Allergen Reactions   Adhesive  [Tape] Hives   Lactose Intolerance (Gi) Diarrhea   Lisinopril Cough   Prednisone Swelling and Other (See Comments)    Excessive fluid buildup    Patient Measurements: Height: '5\' 5"'$  (165.1 cm) Weight: 56.2 kg (123 lb 14.4 oz) IBW/kg (Calculated) : 57  Vital Signs: Temp: 97.6 F (36.4 C) (01/15 1034) Temp Source: Oral (01/15 1034) BP: 120/63 (01/15 1036) Pulse Rate: 81 (01/15 1036)  Labs: Recent Labs    08/08/22 0154 08/09/22 0502 08/10/22 0446  HGB 11.5* 11.6* 11.6*  HCT 34.2* 36.8 34.3*  PLT 365 320 359  CREATININE  --  5.01* 6.06*    Estimated Creatinine Clearance: 7.6 mL/min (A) (by C-G formula based on SCr of 6.06 mg/dL (H)).   Assessment: 72 year old female to begin heparin for limb ischemia Has a drain in place, also with ESRD  Goal of Therapy:  Heparin level 0.3-0.7 units/ml Monitor platelets by anticoagulation protocol: Yes   Plan:  No heparin bolus Heparin drip at 800 units / hr Heparin level in 8 hours Daily heparin level, CBC  Thank you Anette Guarneri, PharmD  08/10/2022,11:51 AM

## 2022-08-10 NOTE — Progress Notes (Signed)
ANTICOAGULATION CONSULT NOTE  Pharmacy Consult for Heparin Indication:  Limb ischemia   Allergies  Allergen Reactions   Adhesive  [Tape] Hives   Lactose Intolerance (Gi) Diarrhea   Lisinopril Cough   Prednisone Swelling and Other (See Comments)    Excessive fluid buildup    Patient Measurements: Height: '5\' 5"'$  (165.1 cm) Weight: 56.2 kg (123 lb 14.4 oz) IBW/kg (Calculated) : 57  Vital Signs: Temp: 98.2 F (36.8 C) (01/15 2109) Temp Source: Oral (01/15 2109) BP: 173/50 (01/15 2109) Pulse Rate: 83 (01/15 2109)  Labs: Recent Labs    08/08/22 0154 08/09/22 0502 08/10/22 0446 08/10/22 2027  HGB 11.5* 11.6* 11.6*  --   HCT 34.2* 36.8 34.3*  --   PLT 365 320 359  --   HEPARINUNFRC  --   --   --  0.62  CREATININE  --  5.01* 6.06*  --      Estimated Creatinine Clearance: 7.6 mL/min (A) (by C-G formula based on SCr of 6.06 mg/dL (H)).   Assessment: 72 year old female to begin heparin for limb ischemia.  Has a drain in place, also with ESRD.  Heparin level therapeutic and at the high end of normal.  No bleeding reported.  Goal of Therapy:  Heparin level 0.3-0.7 units/ml Monitor platelets by anticoagulation protocol: Yes   Plan:  No heparin bolus Reduce heparin gtt slightly to 750 units/hr F/U AM labs  Dekisha Mesmer D. Mina Marble, PharmD, BCPS, [place] 08/10/2022, 9:26 PM

## 2022-08-10 NOTE — Progress Notes (Signed)
Supervising Physician: Ruthann Cancer  Patient Status:  [facility] - In-pt  Chief Complaint:  Suspected perforated appendicitis with pelvic abscess, s/p drain placement 08/03/22 by Dr. Earleen Newport  Subjective:  Just returning to room from [place].  Patient resting.  Opens eyes and responds appropriately, then closes eyes again. No acute distress.  Allergies: Adhesive  [tape], Lactose intolerance (gi), Lisinopril, and Prednisone  Medications: Prior to Admission medications   Medication Sig Start Date End Date Taking? Authorizing Provider  albuterol (VENTOLIN HFA) 108 (90 Base) MCG/ACT inhaler Inhale 2 puffs into the lungs every 4 (four) hours as needed for wheezing or shortness of breath. 07/21/22  Yes Piontek, Erin, MD  amLODipine (NORVASC) 10 MG tablet Take 10 mg by mouth daily. 02/09/22  Yes [provider]  aspirin EC 81 MG tablet Take 81 mg by mouth daily.   Yes [provider]  atorvastatin (LIPITOR) 40 MG tablet Take 40 mg by mouth at bedtime. 07/17/21  Yes [provider]  b complex-vitamin c-folic acid (NEPHRO-VITE) 0.8 MG TABS tablet Take 1 tablet by mouth at bedtime.   Yes [provider]  busPIRone (BUSPAR) 7.5 MG tablet Take 7.5 mg by mouth daily as needed (anxiety). 10/29/19  Yes [provider]  carvedilol (COREG) 12.5 MG tablet Take 12.5 mg by mouth See admin instructions. Take 1 tablet (12.5 mg) by mouth once daily in the evening on Mondays, Wednesdays, & Fridays. (Dialysis) Take 1 tablet (12.5 mg) by mouth twice daily on Sundays, Tuesdays, Thursdays, & Saturdays. (Non Dialysis)   Yes [provider]  cinacalcet (SENSIPAR) 60 MG tablet Take 60 mg by mouth See admin instructions. Take 1 tablet (60 mg) by mouth in the evening on Mondays, Wednesdays, & Fridays (dialysis) Take 1 tablet (60 mg) by mouth twice daily on Sundays, Tuesdays, Thursdays, Saturdays. (non dialysis) 11/28/18  Yes [provider]  diphenhydrAMINE HCl  (BENADRYL ALLERGY PO) Take 1 capsule by mouth every Monday, Wednesday, and Friday with hemodialysis. If needed to help with allergies   Yes [provider]  ferric citrate (AURYXIA) 1 GM 210 MG(Fe) tablet Take 210-420 mg by mouth See admin instructions. Take 2 tablets (420 mg) by mouth with each meal & take 1 tablet (210 mg) by mouth with snacks.   Yes [provider]  fluticasone (FLONASE) 50 MCG/ACT nasal spray Place 2 sprays into both nostrils daily as needed for allergies or rhinitis. 04/07/22  Yes Valentina Shaggy, MD  gabapentin (NEURONTIN) 100 MG capsule TAKE 1 CAPSULE (100 MG TOTAL) BY MOUTH AT BEDTIME. WHEN NECESSARY FOR NEUROPATHY PAIN Patient taking differently: Take 100 mg by mouth at bedtime as needed (When necessary for neuropathy pain). 12/13/18  Yes Newt Minion, MD  glipiZIDE (GLUCOTROL) 5 MG tablet Take 5 mg by mouth at bedtime.   Yes [provider]  isosorbide-hydrALAZINE (BIDIL) 20-37.5 MG tablet Take 1 tablet by mouth See admin instructions. hs on Monday,Wednesday,Friday (dialysis days) Tid on Tuesday,Thursday,Saturday,Sunday(non-dialysis days) Patient taking differently: Take 1 tablet by mouth See admin instructions. Take in the evening on Monday,Wednesday,Friday (dialysis days) BID on Tuesday,Thursday,Saturday,Sunday(non-dialysis days) 01/08/22  Yes Cantwell, Celeste C, PA-C  lidocaine-prilocaine (EMLA) cream Apply 1 Application topically as needed. 03/31/22  Yes Vanessa Kick, MD  loperamide (IMODIUM) 2 MG capsule Take 2 capsules (4 mg total) by mouth 3 (three) times daily as needed for diarrhea or loose stools. 08/04/21  Yes Bonnielee Haff, MD  Methoxy PEG-Epoetin Beta (MIRCERA IJ) Mircera 08/20/21 10/23/22 Yes [provider]  montelukast (SINGULAIR) 10 MG tablet TAKE 1 TABLET BY MOUTH EVERYDAY AT BEDTIME Patient taking differently: Take 10 mg by mouth at bedtime. 04/07/22  Yes Valentina Shaggy, MD  omeprazole (PRILOSEC) 20 MG capsule  Take 20 mg by mouth daily as needed (for acid reflux).   Yes [provider]  ondansetron (ZOFRAN) 4 MG tablet Take 1 tablet (4 mg total) by mouth every 6 (six) hours as needed for nausea. 05/22/19  Yes Aline August, MD  promethazine-dextromethorphan (PROMETHAZINE-DM) 6.25-15 MG/5ML syrup Take 5 mLs by mouth 4 (four) times daily as needed for cough. 07/21/22  Yes Piontek, Junie Panning, MD  traZODone (DESYREL) 50 MG tablet Take 25-50 mg by mouth at bedtime as needed for sleep. 10/29/19  Yes [provider]  ursodiol (ACTIGALL) 250 MG tablet Take 250 mg by mouth 2 (two) times daily. 07/28/22  Yes [provider]  budesonide-formoterol (SYMBICORT) 160-4.5 MCG/ACT inhaler Inhale 2 puffs into the lungs 2 (two) times daily. Patient not taking: Reported on 05/28/2022 04/07/22   Valentina Shaggy, MD  EPINEPHrine (AUVI-Q) 0.3 mg/0.3 mL IJ SOAJ injection Inject 0.3 mg into the muscle as needed for anaphylaxis. Patient not taking: Reported on 08/03/2022 12/24/20   Valentina Shaggy, MD  fluticasone-salmeterol (ADVAIR [facility]) [phone] MCG/ACT inhaler Inhale two puffs twice daily Patient not taking: Reported on 05/28/2022 12/24/20   Valentina Shaggy, MD  latanoprost (XALATAN) 0.005 % ophthalmic solution Place 1 drop into the left eye at bedtime. Patient not taking: Reported on 05/28/2022 11/25/21 11/25/22  Rankin, Clent Demark, MD  levocetirizine (XYZAL) 5 MG tablet Take 1 tablet (5 mg total) by mouth every evening. Patient not taking: Reported on 08/03/2022 04/07/22   Valentina Shaggy, MD  ofloxacin (OCUFLOX) 0.3 % ophthalmic solution Place 1 drop into the right eye 4 (four) times daily. Patient not taking: Reported on 08/03/2022 05/27/22   [provider]  prednisoLONE acetate (PRED FORTE) 1 % ophthalmic suspension Place 1 drop into the right eye 4 (four) times daily. Patient not taking: Reported on 08/03/2022 05/27/22   [provider]  Spacer/Aero-Holding Chambers (AEROCHAMBER PLUS)  inhaler Use with inhaler 03/09/21   Melynda Ripple, MD     Vital Signs: BP (!) 189/57   Pulse 81   Temp 97.6 F (36.4 C) (Oral)   Resp 18   Ht '5\' 5"'$  (1.651 m)   Wt 123 lb 14.4 oz (56.2 kg)   SpO2 100%   BMI 20.62 kg/m   Physical Exam Vitals reviewed.  Constitutional:      General: She is not in acute distress. Cardiovascular:     Rate and Rhythm: Normal rate.  Pulmonary:     Effort: Pulmonary effort is normal. No respiratory distress.  Abdominal:     General: Abdomen is flat.     Palpations: Abdomen is soft.     Tenderness: There is abdominal tenderness.  Skin:    General: Skin is warm and dry.  Neurological:     Mental Status: She is easily aroused.    Drain Location: RLQ Size: Fr size: 10 Fr Date of placement: 08/03/22  Currently to: Drain collection device: suction bulb 24 hour output:  Output by Drain (mL) 08/08/22 0700 - 08/08/22 1459 08/08/22 1500 - 08/08/22 2259 08/08/22 2300 - 08/09/22 0659 08/09/22 0700 - 08/09/22 1459 08/09/22 1500 - 08/09/22 2259 08/09/22 2300 - 08/10/22 0659 08/10/22 0700 - 08/10/22 1459 08/10/22 1500 - 08/10/22 1606  Closed System Drain Lateral RLQ   8  5 10 0    Current examination: Flushes/aspirates easily.  Insertion site unremarkable. Suture and stat lock in place. Dressed appropriately.  Minimal serous output in JP bulb  Imaging: NM Hepatobiliary Liver Func  Result Date: 08/10/2022 CLINICAL DATA:  Concern for acute cholecystitis. EXAM: NUCLEAR MEDICINE HEPATOBILIARY IMAGING TECHNIQUE: Sequential images of the abdomen were obtained out to 60 minutes following intravenous administration of radiopharmaceutical. RADIOPHARMACEUTICALS:  5.2 mCi Tc-8m Choletec IV COMPARISON:  CT August 06, 2022. FINDINGS: Prompt uptake and biliary excretion of activity by the liver is seen. Gallbladder activity is visualized, consistent with patency of cystic duct. Biliary activity passes into small bowel, consistent with patent common bile duct.  IMPRESSION: Patent cystic and common bile ducts without scintigraphic evidence of acute cholecystitis. Electronically Signed   By: JDahlia BailiffM.D.   On: 08/10/2022 13:39   [place]KG SITE RITE  Result Date: 08/10/2022 If Site Rite image not attached, placement could not be confirmed due to current cardiac rhythm.  CT ABDOMEN PELVIS W CONTRAST  Result Date: 08/06/2022 CLINICAL DATA:  Worsening right-sided abdominal pain. Status post right pelvic abscess drainage 3 days ago. EXAM: CT ABDOMEN AND PELVIS WITH CONTRAST TECHNIQUE: Multidetector CT imaging of the abdomen and pelvis was performed using the standard protocol following bolus administration of intravenous contrast. RADIATION DOSE REDUCTION: This exam was performed according to the departmental dose-optimization program which includes automated exposure control, adjustment of the mA and/or kV according to patient size and/or use of iterative reconstruction technique. CONTRAST:  734mOMNIPAQUE IOHEXOL 350 MG/ML SOLN COMPARISON:  CT abdomen pelvis dated August 02, 2022. FINDINGS: Lower chest: No acute abnormality. Hepatobiliary: No focal liver abnormality. Multiple tiny layering gallstones again noted. No gallbladder wall thickening or biliary dilatation. Pancreas: Unremarkable. No pancreatic ductal dilatation or surrounding inflammatory changes. Spleen: Normal in size without focal abnormality. Adrenals/Urinary Tract: Adrenal glands are unremarkable. Similar bilateral renal atrophy with delayed renal enhancement and contrast excretion, consistent with medical renal disease. Unchanged round 2.1 cm heterogeneously enhancing lesion in the upper pole of the left kidney. Extensive bilateral renovascular calcifications again noted. Small bilateral renal simple cysts are unchanged. No follow-up imaging is recommended. No hydronephrosis. The bladder is decompressed. Stomach/Bowel: Stomach is within normal limits. The small bowel and colon are unremarkable. The  tip of the appendix is again seen terminating in the right pelvic complex fluid collection (series 6, images 65-91), which is significantly decreased in size status post percutaneous drainage, with pigtail drainage catheter appropriately positioned. There are a few residual tiny foci of internal gas, but no significant residual fluid component. There are several loops of small bowel adjacent to this area, but without obvious fistula or small-bowel inflammatory change. This area remains inseparable from the right ovary. Vascular/Lymphatic: Aortic atherosclerosis. No enlarged abdominal or pelvic lymph nodes. Reproductive: Prior hysterectomy and left oophorectomy. Unchanged 3.8 x 3.0 cm complex right ovarian mass with internal calcification. Other: No new fluid collection. No free fluid or pneumoperitoneum. Unchanged small fat containing right inguinal and femoral hernias. Musculoskeletal: No acute or significant osseous findings. IMPRESSION: 1. Significant interval decrease in size of the right pelvic abscess status post percutaneous drainage, with pigtail drainage catheter appropriately positioned. There are a few residual tiny foci of internal gas, but no significant residual fluid component. The tip of the appendix terminates within and is inseparable from this area, again suspicious for perforated appendicitis. There are several loops of small bowel adjacent to this area, but without obvious fistula or small-bowel inflammatory change.  This area remains inseparable from the right ovary. 2. Unchanged 3.8 cm complex right ovarian mass with internal calcification, previously characterized on prior ultrasound. Gynecological consultation was recommended at that time. 3. Unchanged 2.1 cm heterogeneously enhancing left upper pole renal lesion, suspicious for primary renal neoplasm. Outpatient non-emergent follow-up renal protocol CT or MRI with and without contrast is recommended for further evaluation. 4. Unchanged  cholelithiasis. 5.  Aortic Atherosclerosis (ICD10-I70.0). Electronically Signed   By: Titus Dubin M.D.   On: 08/06/2022 18:53    Labs:  CBC: Recent Labs    08/07/22 0627 08/08/22 0154 08/09/22 0502 08/10/22 0446  WBC 20.9* 24.8* 27.1* 28.8*  HGB 11.5* 11.5* 11.6* 11.6*  HCT 36.6 34.2* 36.8 34.3*  PLT 420* 365 320 359    COAGS: Recent Labs    08/03/22 0422  INR 1.0    BMP: Recent Labs    08/05/22 0403 08/07/22 0627 08/09/22 0502 08/10/22 0446  NA 129* 131* 132* 128*  K 3.3* 3.1* 3.0* 3.2*  CL 88* 88* 91* 90*  CO2 '25 24 22 '$ 21*  GLUCOSE 152* 180* 159* 190*  BUN 22 36* 34* 41*  CALCIUM 9.4 9.5 10.0 10.0  CREATININE 5.14* 5.33* 5.01* 6.06*  GFRNONAA 8* 8* 9* 7*    LIVER FUNCTION TESTS: Recent Labs    08/08/2022 1730 08/03/22 0422 08/04/22 0438 08/05/22 0403 08/07/22 0627 08/09/22 0502  BILITOT 0.6 0.6 0.6 0.5  --   --   AST '19 15 15 15  '$ --   --   ALT '9 9 10 10  '$ --   --   ALKPHOS 93 86 86 73  --   --   PROT 6.3* 5.9* 6.2* 6.0*  --   --   ALBUMIN 2.7* 2.3* 2.2* 2.2* 2.3* 2.3*    Assessment and Plan:  Pelvic abscess S/p drain placement 08/03/22 OP serous in nature, minimal.  May consider repeat imaging and removal of drain in near future if Surgery agrees  Plan: Continue TID flushes with 5 cc NS. Record output Q shift.  Dressing changes QD or PRN if soiled.   Call IR APP or on call IR MD if difficulty flushing or sudden change in drain output.  Repeat imaging/possible drain injection once output < 10 mL/QD (excluding flush material). Consideration for drain removal if output is < 10 mL/QD (excluding flush material), pending discussion with the providing surgical service.  Discharge planning: Please contact IR APP or on call IR MD prior to patient d/c to ensure appropriate follow up plans are in place. Typically patient will follow up with IR clinic 10-14 days post d/c for repeat imaging/possible drain injection. IR scheduler will contact patient with  date/time of appointment. Patient will need to flush drain QD with 5 cc NS, record output QD, dressing changes every 2-3 days or earlier if soiled.   IR will continue to follow - please call with questions or concerns.  Electronically Signed: Pasty Spillers, PA 08/10/2022, 4:00 PM   I spent a total of 15 Minutes at the the patient's bedside AND on the patient's hospital floor or unit, greater than 50% of which was counseling/coordinating care for pelvic abscess

## 2022-08-10 NOTE — Consult Note (Signed)
Hospital Consult    Reason for Consult: Bilateral lower extremity pain, left greater than right Requesting Physician: Dr. Waldron Labs MRN #:  [id number]  History of Present Illness: This is a 72 y.o. female with history of peripheral arterial disease, end-stage renal disease, currently hospitalized with retroperitoneal abscess.  Vascular surgery was called from a complaint of bilateral lower extremity pain, left greater than right, which has been present for quite some time.  On exam, Destiny Day was undergoing dialysis.  She noted bilateral lower extremity rest pain, left greater than right.  She was a poor historian, but no significant pain present since her initial presentation to the hospital.  When probed further, she stated this pain had been present for over a month.  She denied sensorimotor deficits.  Bilateral feet with transmetatarsal amputations.  Past Medical History:  Diagnosis Date   Abdominal bruit    Anxiety    Arthritis    Osteoarthritis   Asthma    Cervical disc disease    "pinced nerve"   CHF (congestive heart failure) (HCC)    COPD (chronic obstructive pulmonary disease) (HCC)    Diabetes mellitus    Type II   Diverticulitis    ESRD (end stage renal disease) ([place])    dialysis - M/W/F- [place]   GERD (gastroesophageal reflux disease)    from medications   GI bleed 03/31/2013   Head injury 07/2017   History of hiatal hernia    Hyperlipidemia    Hypertension    Neuropathy    left leg   Nuclear sclerotic cataract of right eye 04/23/2020   Osteoporosis    Peripheral vascular disease ([place])    Pneumonia    "very young" and a few years ago   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)    Vitrectomy membrane peel endolaser 05-07-2021   Seasonal allergies    Shortness of breath dyspnea    WIth exertion, when fluid builds   Sleep apnea    can't afford cpap    Vitreomacular traction syndrome,  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   05-07-2021  Vitrectomy, membrane peel, PRP, resection posterior segment of NVD, remnants of peripheral NVE remain in place due to atrophic retina, no vitreous substitute    Past Surgical History:  Procedure Laterality Date   A/V SHUNTOGRAM N/A 09/22/2016   Procedure: A/V Shuntogram - left arm;  Surgeon: Serafina Mitchell, MD;  Location: [place];  Service: Cardiovascular;  Laterality: N/A;   A/V SHUNTOGRAM N/A 03/22/2018   Procedure: A/V SHUNTOGRAM - left arm;  Surgeon: Serafina Mitchell, MD;  Location: [place] [facility];  Service: Cardiovascular;  Laterality: N/A;   A/V SHUNTOGRAM Left 11/17/2018   Procedure: A/V SHUNTOGRAM;  Surgeon: Angelia Mould, MD;  Location: [facility] [facility];  Service: Cardiovascular;  Laterality: Left;   ABDOMINAL HYSTERECTOMY  1993`   AMPUTATION Left 09/01/2016   Procedure: LEFT FOOT TRANSMETATARSAL AMPUTATION;  Surgeon: Newt Minion, MD;  Location: [place];  Service: Orthopedics;  Laterality: Left;   AMPUTATION Right 08/11/2017   Procedure: RIGHT GREAT TOE AMPUTATION DIGIT;  Surgeon: Rosetta Posner, MD;  Location: [place];  Service: Vascular;  Laterality: Right;   AMPUTATION Right 12/31/2017   Procedure: RIGHT TRANSMETATARSAL AMPUTATION;  Surgeon: Newt Minion, MD;  Location: [place];  Service: Orthopedics;  Laterality: Right;   AV FISTULA PLACEMENT Left 04/21/2016   Procedure: INSERTION OF ARTERIOVENOUS (AV) GORE-TEX GRAFT ARM LEFT;  Surgeon: Jessy Oto  Fields, MD;  Location: [place];  Service: Vascular;  Laterality: Left;   [place] Left 07/10/2014   Procedure: BASCILIC VEIN TRANSPOSITION;  Surgeon: Angelia Mould, MD;  Location: [place];  Service: Vascular;  Laterality: Left;   [place] Right 11/08/2014   Procedure: FIRST STAGE BASILIC VEIN TRANSPOSITION;  Surgeon: Angelia Mould, MD;  Location: [place];  Service: Vascular;  Laterality: Right;   [place] Right 01/18/2015    Procedure: SECOND STAGE BASILIC VEIN TRANSPOSITION;  Surgeon: Angelia Mould, MD;  Location: [place];  Service: Vascular;  Laterality: Right;   CRANIOTOMY N/A 08/23/2017   Procedure: CRANIOTOMY HEMATOMA EVACUATION SUBDURAL;  Surgeon: Ashok Pall, MD;  Location: [place];  Service: Neurosurgery;  Laterality: N/A;   CRANIOTOMY Left 08/24/2017   Procedure: CRANIOTOMY FOR RECURRENT ACUTE SUBDURAL HEMATOMA;  Surgeon: Ashok Pall, MD;  Location: [place];  Service: Neurosurgery;  Laterality: Left;   ESOPHAGOGASTRODUODENOSCOPY N/A 03/31/2013   Procedure: ESOPHAGOGASTRODUODENOSCOPY (EGD);  Surgeon: Gatha Mayer, MD;  Location: [facility];  Service: Endoscopy;  Laterality: N/A;   EYE SURGERY     laser surgery   FEMORAL-POPLITEAL BYPASS GRAFT Left 07/08/2016   Procedure: LEFT  FEMORAL-BELOW KNEE POPLITEAL ARTERY BYPASS GRAFT USING 6MM X 80 CM PROPATEN GORETEX GRAFT WITH RINGS.;  Surgeon: Rosetta Posner, MD;  Location: [facility] OR;  Service: Vascular;  Laterality: Left;   FEMORAL-POPLITEAL BYPASS GRAFT Right 08/11/2017   Procedure: RIGHT FEMORAL TO BELOW KNEE POPLITEAL ARTERKY  BYPASS GRAFT USING 6MM RINGED PROPATEN GRAFT;  Surgeon: Rosetta Posner, MD;  Location: [place];  Service: Vascular;  Laterality: Right;   FISTULOGRAM Left 10/29/2014   Procedure: FISTULOGRAM;  Surgeon: Angelia Mould, MD;  Location: [facility] C[facility] LAB;  Service: Cardiovascular;  Laterality: Left;   GAS/FLUID EXCHANGE Left 12/20/2018   Procedure: AIR GAS EXCHANGE;  Surgeon: Jalene Mullet, MD;  Location: [place];  Service: Ophthalmology;  Laterality: Left;   INSERTION OF DIALYSIS CATHETER N/A 09/05/2020   Procedure: INSERTION OF DIALYSIS CATHETER;  Surgeon: Cherre , MD;  Location: [place];  Service: Vascular;  Laterality: N/A;   INSERTION OF DIALYSIS CATHETER Left 03/13/2021   Procedure: INSERTION OF DIALYSIS CATHETER;  Surgeon: Waynetta Sandy, MD;  Location: [place];  Service: Vascular;  Laterality: Left;   KNEE ARTHROSCOPY Right  05/06/2018   Procedure: RIGHT KNEE ARTHROSCOPY;  Surgeon: Newt Minion, MD;  Location: [place];  Service: Orthopedics;  Laterality: Right;   KNEE ARTHROSCOPY Right 06/10/2018   Procedure: RIGHT KNEE ARTHROSCOPY AND DEBRIDEMENT;  Surgeon: Newt Minion, MD;  Location: [place];  Service: Orthopedics;  Laterality: Right;   LIGATION OF ARTERIOVENOUS  FISTULA Left 04/21/2016   Procedure: LIGATION OF ARTERIOVENOUS  FISTULA LEFT ARM;  Surgeon: Elam Dutch, MD;  Location: [place];  Service: Vascular;  Laterality: Left;   LOWER EXTREMITY ANGIOGRAPHY N/A 04/06/2017   Procedure: Lower Extremity Angiography - Right;  Surgeon: Serafina Mitchell, MD;  Location: [place] [facility];  Service: Cardiovascular;  Laterality: N/A;   MEMBRANE PEEL Left 12/20/2018   Procedure: MEMBRANE PEEL;  Surgeon: Jalene Mullet, MD;  Location: [place];  Service: Ophthalmology;  Laterality: Left;   MEMBRANE PEEL Right 05/29/2022   Procedure: MEMBRANE PEEL;  Surgeon: Hurman Horn, MD;  Location: [place];  Service: Ophthalmology;  Laterality: Right;   PARS PLANA VITRECTOMY Right 05/29/2022   Procedure: PARS PLANA VITRECTOMY WITH 25 GAUGE;  Surgeon: Hurman Horn, MD;  Location:  [place] [facility];  Service: Ophthalmology;  Laterality: Right;   PATCH ANGIOPLASTY Right 01/18/2015   Procedure: BASILIC VEIN PATCH ANGIOPLASTY USING VASCUGUARD PATCH;  Surgeon: Angelia Mould, MD;  Location: [place];  Service: Vascular;  Laterality: Right;   PERIPHERAL VASCULAR BALLOON ANGIOPLASTY  09/22/2016   Procedure: Peripheral Vascular Balloon Angioplasty;  Surgeon: Serafina Mitchell, MD;  Location: [place] [facility];  Service: Cardiovascular;;  Lt. Fistula   PERIPHERAL VASCULAR BALLOON ANGIOPLASTY Left 03/22/2018   Procedure: PERIPHERAL VASCULAR BALLOON ANGIOPLASTY;  Surgeon: Serafina Mitchell, MD;  Location: [place] [facility];  Service: Cardiovascular;  Laterality: Left;  Arm shunt   PERIPHERAL VASCULAR CATHETERIZATION N/A 06/23/2016   Procedure: Abdominal  Aortogram w/Lower Extremity;  Surgeon: Serafina Mitchell, MD;  Location: [place] [facility];  Service: Cardiovascular;  Laterality: N/A;   PERIPHERAL VASCULAR CATHETERIZATION  06/23/2016   Procedure: Peripheral Vascular Intervention;  Surgeon: Serafina Mitchell, MD;  Location: [place] [facility];  Service: Cardiovascular;;  lt common and external illiac artery   PHOTOCOAGULATION WITH LASER Left 12/20/2018   Procedure: PHOTOCOAGULATION WITH LASER;  Surgeon: Jalene Mullet, MD;  Location: [place];  Service: Ophthalmology;  Laterality: Left;   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   Procedure: REPAIR OF COMPLEX TRACTION RETINAL DETACHMENT;  Surgeon: Jalene Mullet, MD;  Location: [place];  Service: Ophthalmology;  Laterality: Left;   REVISION OF ARTERIOVENOUS GORETEX GRAFT Left 09/05/2020   Procedure: REVISION OF ARTERIOVENOUS GORETEX GRAFT LEFT;  Surgeon: Cherre , MD;  Location: [place];  Service: Vascular;  Laterality: Left;    Allergies  Allergen Reactions   Adhesive  [Tape] Hives   Lactose Intolerance (Gi) Diarrhea   Lisinopril Cough   Prednisone Swelling and Other (See Comments)    Excessive fluid buildup    Prior to Admission medications   Medication Sig Start Date End Date Taking? Authorizing Provider  albuterol (VENTOLIN HFA) 108 (90 Base) MCG/ACT inhaler Inhale 2 puffs into the lungs every 4 (four) hours as needed for wheezing or shortness of breath. 07/21/22  Yes Piontek, Erin, MD  amLODipine (NORVASC) 10 MG tablet Take 10 mg by mouth daily. 02/09/22  Yes [provider]  aspirin EC 81 MG tablet Take 81 mg by mouth daily.   Yes [provider]  atorvastatin (LIPITOR) 40 MG tablet Take 40 mg by mouth at bedtime. 07/17/21  Yes [provider]  b complex-vitamin c-folic acid (NEPHRO-VITE) 0.8 MG TABS tablet Take 1 tablet by mouth at bedtime.   Yes [provider]  busPIRone (BUSPAR) 7.5 MG tablet Take 7.5 mg by mouth daily as  needed (anxiety). 10/29/19  Yes [provider]  carvedilol (COREG) 12.5 MG tablet Take 12.5 mg by mouth See admin instructions. Take 1 tablet (12.5 mg) by mouth once daily in the evening on Mondays, Wednesdays, & Fridays. (Dialysis) Take 1 tablet (12.5 mg) by mouth twice daily on Sundays, Tuesdays, Thursdays, & Saturdays. (Non Dialysis)   Yes [provider]  cinacalcet (SENSIPAR) 60 MG tablet Take 60 mg by mouth See admin instructions. Take 1 tablet (60 mg) by mouth in the evening on Mondays, Wednesdays, & Fridays (dialysis) Take 1 tablet (60 mg) by mouth twice daily on Sundays, Tuesdays, Thursdays, Saturdays. (non dialysis) 11/28/18  Yes [provider]  diphenhydrAMINE HCl (BENADRYL ALLERGY PO) Take 1 capsule by mouth every Monday, Wednesday, and Friday with hemodialysis. If needed to help with allergies   Yes [provider]  ferric  citrate (AURYXIA) 1 GM 210 MG(Fe) tablet Take 210-420 mg by mouth See admin instructions. Take 2 tablets (420 mg) by mouth with each meal & take 1 tablet (210 mg) by mouth with snacks.   Yes [provider]  fluticasone (FLONASE) 50 MCG/ACT nasal spray Place 2 sprays into both nostrils daily as needed for allergies or rhinitis. 04/07/22  Yes Valentina Shaggy, MD  gabapentin (NEURONTIN) 100 MG capsule TAKE 1 CAPSULE (100 MG TOTAL) BY MOUTH AT BEDTIME. WHEN NECESSARY FOR NEUROPATHY PAIN Patient taking differently: Take 100 mg by mouth at bedtime as needed (When necessary for neuropathy pain). 12/13/18  Yes Newt Minion, MD  glipiZIDE (GLUCOTROL) 5 MG tablet Take 5 mg by mouth at bedtime.   Yes [provider]  isosorbide-hydrALAZINE (BIDIL) 20-37.5 MG tablet Take 1 tablet by mouth See admin instructions. hs on Monday,Wednesday,Friday (dialysis days) Tid on Tuesday,Thursday,Saturday,Sunday(non-dialysis days) Patient taking differently: Take 1 tablet by mouth See admin instructions. Take in the evening on  Monday,Wednesday,Friday (dialysis days) BID on Tuesday,Thursday,Saturday,Sunday(non-dialysis days) 01/08/22  Yes Cantwell, Celeste C, PA-C  lidocaine-prilocaine (EMLA) cream Apply 1 Application topically as needed. 03/31/22  Yes Vanessa Kick, MD  loperamide (IMODIUM) 2 MG capsule Take 2 capsules (4 mg total) by mouth 3 (three) times daily as needed for diarrhea or loose stools. 08/04/21  Yes Bonnielee Haff, MD  Methoxy PEG-Epoetin Beta (MIRCERA IJ) Mircera 08/20/21 10/23/22 Yes [provider]  montelukast (SINGULAIR) 10 MG tablet TAKE 1 TABLET BY MOUTH EVERYDAY AT BEDTIME Patient taking differently: Take 10 mg by mouth at bedtime. 04/07/22  Yes Valentina Shaggy, MD  omeprazole (PRILOSEC) 20 MG capsule Take 20 mg by mouth daily as needed (for acid reflux).   Yes [provider]  ondansetron (ZOFRAN) 4 MG tablet Take 1 tablet (4 mg total) by mouth every 6 (six) hours as needed for nausea. 05/22/19  Yes Aline August, MD  promethazine-dextromethorphan (PROMETHAZINE-DM) 6.25-15 MG/5ML syrup Take 5 mLs by mouth 4 (four) times daily as needed for cough. 07/21/22  Yes Piontek, Junie Panning, MD  traZODone (DESYREL) 50 MG tablet Take 25-50 mg by mouth at bedtime as needed for sleep. 10/29/19  Yes [provider]  ursodiol (ACTIGALL) 250 MG tablet Take 250 mg by mouth 2 (two) times daily. 07/28/22  Yes [provider]  budesonide-formoterol (SYMBICORT) 160-4.5 MCG/ACT inhaler Inhale 2 puffs into the lungs 2 (two) times daily. Patient not taking: Reported on 05/28/2022 04/07/22   Valentina Shaggy, MD  EPINEPHrine (AUVI-Q) 0.3 mg/0.3 mL IJ SOAJ injection Inject 0.3 mg into the muscle as needed for anaphylaxis. Patient not taking: Reported on 08/03/2022 12/24/20   Valentina Shaggy, MD  fluticasone-salmeterol (ADVAIR [facility]) [phone] MCG/ACT inhaler Inhale two puffs twice daily Patient not taking: Reported on 05/28/2022 12/24/20   Valentina Shaggy, MD  latanoprost (XALATAN) 0.005 %  ophthalmic solution Place 1 drop into the left eye at bedtime. Patient not taking: Reported on 05/28/2022 11/25/21 11/25/22  Rankin, Clent Demark, MD  levocetirizine (XYZAL) 5 MG tablet Take 1 tablet (5 mg total) by mouth every evening. Patient not taking: Reported on 08/03/2022 04/07/22   Valentina Shaggy, MD  ofloxacin (OCUFLOX) 0.3 % ophthalmic solution Place 1 drop into the right eye 4 (four) times daily. Patient not taking: Reported on 08/03/2022 05/27/22   [provider]  prednisoLONE acetate (PRED FORTE) 1 % ophthalmic suspension Place 1 drop into the right eye 4 (four) times daily. 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  Tobacco Use   Smoking status: Former    Packs/day: 0.35    Years: 40.00    Total pack years: 14.00    Types: Cigarettes    Quit date: 05/10/2012    Years since quitting: 10.2   Smokeless tobacco: Never  Vaping Use   Vaping Use: Never used  Substance and Sexual Activity   Alcohol use: No   Drug use: No    Comment: marijuana; quit in early 1980's   Sexual activity: Yes  Other Topics Concern   Not on file  Social History Narrative   Patient lives at home with spouse.   Caffeine Use: 1 cup daily   Social Determinants of Health   Financial Resource Strain: Not on file  Food Insecurity: No Food Insecurity (08/04/2022)   Hunger Vital Sign    Worried About Running Out of Food in the Last Year: Never true    Ran Out of Food in the Last Year: Never true  Transportation Needs: No Transportation Needs (08/04/2022)   PRAPARE -  Hydrologist (Medical): No    Lack of Transportation (Non-Medical): No  Physical Activity: Not on file  Stress: Not on file  Social Connections: Not on file  Intimate Partner Violence: Not At Risk (08/04/2022)   Humiliation, Afraid, Rape, and Kick questionnaire    Fear of Current or Ex-Partner: No    Emotionally Abused: No    Physically Abused: No    Sexually Abused: No    Family History  Problem Relation Age of Onset   Other Mother        not sure of cause of death   Diabetes Father    Diabetes Sister    Diabetes Sister    Pancreatic cancer Maternal Grandmother    Colon cancer Neg Hx     ROS: Otherwise negative unless mentioned in HPI  Physical Examination  Vitals:   08/10/22 0930 08/10/22 1000  BP: (!) 135/56 131/65  Pulse: 75 81  Resp: (!) 25 19  Temp:    SpO2: 97% 100%   Body mass index is 20.87 kg/m.  General:  WDWN in NAD Gait: Not observed HENT: WNL, normocephalic Pulmonary: normal non-labored breathing, without Rales, rhonchi,  wheezing Cardiac: regular Abdomen: soft, NT/ND, no masses -IR drain in the suprapubic region. Skin: without rashes Vascular Exam/Pulses: Nonpalpable femoral pulses bilaterally, posterior tibial artery signals in the right foot, no signals in the left foot. Extremities: without ischemic changes, without Gangrene , without cellulitis; without open wounds;  Musculoskeletal: no muscle wasting or atrophy  Neurologic: A&O X 3;  No focal weakness or paresthesias are detected; speech is fluent/normal Psychiatric:  The pt has pain out of proportion to palpation.  When sitting in bed, she was asleep until I would touch her lower extremities. Lymph:  Unremarkable  CBC    Component Value Date/Time   WBC 28.8 (H) 08/10/2022 0446   RBC 3.61 (L) 08/10/2022 0446   HGB 11.6 (L) 08/10/2022 0446   HGB 11.2 12/08/2019 1300   HCT 34.3 (  L) 08/10/2022 0446   HCT 32.1 (L) 12/08/2019 1300   PLT 359 08/10/2022 0446   PLT  269 12/08/2019 1300   MCV 95.0 08/10/2022 0446   MCV 92 12/08/2019 1300   MCH 32.1 08/10/2022 0446   MCHC 33.8 08/10/2022 0446   RDW 13.0 08/10/2022 0446   RDW 12.2 12/08/2019 1300   LYMPHSABS 0.9 08/09/2022 1730   LYMPHSABS 2.0 12/08/2019 1300   MONOABS 0.7 08/12/2022 1730   EOSABS 0.1 08/16/2022 1730   EOSABS 0.4 12/08/2019 1300   BASOSABS 0.0 08/07/2022 1730   BASOSABS 0.0 12/08/2019 1300    BMET    Component Value Date/Time   NA 128 (L) 08/10/2022 0446   NA 135 (A) 07/15/2016 0000   K 3.2 (L) 08/10/2022 0446   CL 90 (L) 08/10/2022 0446   CO2 21 (L) 08/10/2022 0446   GLUCOSE 190 (H) 08/10/2022 0446   BUN 41 (H) 08/10/2022 0446   BUN 30 (A) 07/15/2016 0000   CREATININE 6.06 (H) 08/10/2022 0446   CALCIUM 10.0 08/10/2022 0446   GFRNONAA 7 (L) 08/10/2022 0446   GFRAA 6 (L) 06/26/2019 0500    COAGS: Lab Results  Component Value Date   INR 1.0 08/03/2022   INR 1.1 05/17/2019   INR 1.02 08/23/2017     ASSESSMENT/PLAN: This is a 72 y.o. female currently admitted for retroperitoneal abscess with known history of left iliac artery stenting, bilateral femoral to below-knee popliteal artery bypasses-right side in 2019, left side 2017.  She has had multiple procedures for long-term HD access as well.  On physical exam, Destiny Day had nonpalpable pulses in the lower extremities with no signals in the left foot.  She had nonpalpable femoral pulses as well.  The pain she describes been present for at least 1 week, but when probed further states that the pain has been 1 month.  This is not acute limb ischemia, however needs further workup as I am concerned for bypass this may be thrombosed.  I am also worried about her previous iliac artery stents.  Recommend CT angio abdomen pelvis in an effort to assess patency of iliac system bilateral for multiple and popliteal artery bypasses.  Patient would also benefit from ABI.  Destiny Day would benefit from heparin drip until this is sorted  if not contraindicated with the drain.   Destiny Santee MD [facility] [phone] 08/10/2022  10:31 AM

## 2022-08-10 NOTE — Progress Notes (Signed)
Patient CT scan reviewed demonstrating severe atherosclerotic disease throughout her vasculature. There appears to be multiple areas of tandem, flow-limiting stenosis within Destiny Day's iliac arteries.  The left-sided femoral to below-knee popliteal artery bypass is occluded.  Unable to assess her tibial vessels due to significant calcification.  Destiny Day would benefit from left lower extremity angiogram in an effort to define, and improve distal perfusion to alleviate rest pain.  This is not acute, and has been present for at least 2 weeks per both Destiny Day and her husband.   Complicating timing to intervention is her current pelvic abscess, for which she has a drain.    I plan to discuss her case with my colleagues.  The pelvic abscess could act as a nidus for seeding PTFE graft,  should she have another bypass target.  I would also like for mental status to clear up as well prior to undergoing intervention as she needs to be able to lie flat and follow directions for an angiogram.  Please continue heparin at this time.  Would appreciate mental status workup. Plan for angiography later this week pending improvement.   I discussed the above with both her son and her husband over the phone.  Destiny John MD

## 2022-08-10 NOTE — Progress Notes (Signed)
Patient ID: Destiny Day, female   DOB: February 13, 1951, 72 y.o.   MRN: [id number]   HIDA scan negative for cholecystitis with patent cystic duct  Ok to resume diet from a general surgery standpoint

## 2022-08-10 NOTE — Progress Notes (Signed)
PROGRESS NOTE  Destiny Day VQM:[id number] DOB: 10/22/1950 DOA: 08/11/2022 PCP: Cipriano Mile, NP   LOS: 8 days   Brief Narrative / Interim history:  72 year old female with history of ESRD on MWF HD, asthma/COPD, diastolic CHF, HTN, HLD, DM2, comes into the hospital with few days of right lower quadrant abdominal pain.  Imaging in the ED concerning for perforated appendicitis with a periappendiceal abscess with probably secondary involvement of the previously noted adnexal mass or a tubo-ovarian abscess.  She was placed on antibiotics, general surgery was consulted and she was admitted to the hospital.  Subjective / 24h Interval events:  Still complains of lower extremity pain, left> right  Assesement and Plan:  Intra-abdominal abscess, concern for perforated appendicitis  -patient started on antibiotics with Zosyn, continue.  General surgery consulted, IR consulted and she is status post right lower quadrant abscess drain placement.  Microbiology so far shows GNR, GPC in chains and GPR.  -drain culture growing E. coli and Klebsiella, continiue with   Zosyn. -Management per general surgery, CT abdomen pelvis obtained yesterday it is reassuring, plan to continue with IV antibiotics for now, with IR to manage drain, at 1 point she will need surgery when stable -She remains with significant leukocytosis despite being on appropriate antibiotic treatment, and Hedrin appear to be functioning appropriately, she did have some right upper quadrant abdominal pain, plan for HIDA scan per general surgery  Leukocytosis -Can be related to multiple etiologies, possibly due to above, but repeat imaging showing improving abscess, HIDA scan is pending. -As well she is with known lower extremity PAD, unclear is currently having critical ischemia, workup is pending, please see discussion below-she is with diarrhea, if other workup is inconclusive then she will need to be checked for C.  Difficile  PAD -Vascular surgery input greatly appreciated, will follow on CT angio abdomen and pelvi. -Started on heparin drip, discussed with IR, no contraindication regarding the drain.  Essential hypertension- continue home medications.  Hospital delirium -Much improved today, continue with delirium precaution  Complex right ovarian mass -Discussed with husband, he understand important patient to follow-up with GYN as an outpatient  Left upper pole renal lesion -Have discussed these findings with husband as well, he understands importance of follow-up with urology as an outpatient  Neuropathic pain - she is complaining of bilateral extremity neuropathic pain so I will resume her back on gabapentin  ESRD on HD-nephrology consulted,HD per renal   Hyperlipidemia-resume home statin  Chronic diastolic CHF -appears euvolemic, volume management per HD  Anxiety-continue home BuSpar as needed  Asthma/COPD-stable, no wheezing, no exacerbation  Hypokalemia-replete and continue to monitor, potassium normalized this morning  Hyponatremia-fluid status managed with HD   DM2-continue sliding scale.  CBG (last 3)  Recent Labs    08/09/22 2048 08/10/22 0657 08/10/22 1141  GLUCAP 225* 202* 145*    Scheduled Meds:  acidophilus  1 capsule Oral Daily   atorvastatin  40 mg Oral QHS   carvedilol  12.5 mg Oral Q M,W,F-HD   carvedilol  12.5 mg Oral 2 times per day on Tue Thu Sat   Chlorhexidine Gluconate Cloth  6 each Topical Q0600   doxercalciferol  7 mcg Intravenous Q M,W,F-HD   ferric citrate  420 mg Oral TID WC   fluticasone furoate-vilanterol  1 puff Inhalation Daily    HYDROmorphone (DILAUDID) injection  0.5 mg Intravenous Once   insulin aspart  0-6 Units Subcutaneous TID WC   isosorbide-hydrALAZINE  1 tablet Oral 2 times  per day on Sun Tue Thu Sat   isosorbide-hydrALAZINE  1 tablet Oral Q M,W,F-HD   metroNIDAZOLE  500 mg Oral Q12H   ondansetron (ZOFRAN) IV  4 mg  Intravenous Once   pantoprazole  40 mg Oral BID   sodium chloride flush  5 mL Intracatheter Q8H   ursodiol  300 mg Oral BID   Continuous Infusions:  cefTRIAXone (ROCEPHIN)  IV     heparin     PRN Meds:.albuterol, busPIRone, fluticasone, hydrALAZINE, ondansetron (ZOFRAN) IV, ondansetron, oxyCODONE, prochlorperazine, traZODone  Current Outpatient Medications  Medication Instructions   albuterol (VENTOLIN HFA) 108 (90 Base) MCG/ACT inhaler 2 puffs, Inhalation, Every 4 hours PRN   amLODipine (NORVASC) 10 mg, Oral, Daily   aspirin EC 81 mg, Oral, Daily   atorvastatin (LIPITOR) 40 mg, Oral, Daily at bedtime   b complex-vitamin c-folic acid (NEPHRO-VITE) 0.8 MG TABS tablet 1 tablet, Oral, Daily at bedtime   budesonide-formoterol (SYMBICORT) 160-4.5 MCG/ACT inhaler 2 puffs, Inhalation, 2 times daily   busPIRone (BUSPAR) 7.5 mg, Oral, Daily PRN   carvedilol (COREG) 12.5 mg, Oral, See admin instructions, Take 1 tablet (12.5 mg) by mouth once daily in the evening on Mondays, Wednesdays, & Fridays. (Dialysis)<BR>Take 1 tablet (12.5 mg) by mouth twice daily on 'Sundays, Tuesdays, Thursdays, & Saturdays. (Non Dialysis)   cinacalcet (SENSIPAR) 60 mg, Oral, See admin instructions, Take 1 tablet (60 mg) by mouth in the evening on Mondays, Wednesdays, & Fridays (dialysis)<BR>Take 1 tablet (60 mg) by mouth twice daily on Sundays, Tuesdays, Thursdays, Saturdays. (non dialysis)   diphenhydrAMINE HCl (BENADRYL ALLERGY PO) 1 capsule, Oral, Every M-W-F (Hemodialysis), If needed to help with allergies   EPINEPHrine (AUVI-Q) 0.3 mg, Intramuscular, As needed   ferric citrate (AURYXIA) 210-420 mg, Oral, See admin instructions, Take 2 tablets (420 mg) by mouth with each meal & take 1 tablet (210 mg) by mouth with snacks.    fluticasone (FLONASE) 50 MCG/ACT nasal spray 2 sprays, Each Nare, Daily PRN   fluticasone-salmeterol (ADVAIR HFA) 115-21 MCG/ACT inhaler Inhale two puffs twice daily   gabapentin (NEURONTIN) 100 mg,  Oral, Daily at bedtime, When necessary for neuropathy pain   glipiZIDE (GLUCOTROL) 5 mg, Oral, Daily at bedtime   isosorbide-hydrALAZINE (BIDIL) 20-37.5 MG tablet 1 tablet, Oral, See admin instructions, hs on Monday,Wednesday,Friday (dialysis days)<BR>Tid on Tuesday,Thursday,Saturday,Sunday(non-dialysis days)   latanoprost (XALATAN) 0.005 % ophthalmic solution 1 drop, Left Eye, Daily at bedtime   levocetirizine (XYZAL) 5 mg, Oral, Every evening   lidocaine-prilocaine (EMLA) cream 1 Application, Topical, As needed   loperamide (IMODIUM) 4 mg, Oral, 3 times daily PRN   Methoxy PEG-Epoetin Beta (MIRCERA IJ) Mircera   montelukast (SINGULAIR) 10 MG tablet TAKE 1 TABLET BY MOUTH EVERYDAY AT BEDTIME   ofloxacin (OCUFLOX) 0.3 % ophthalmic solution 1 drop, 4 times daily   omeprazole (PRILOSEC) 20 mg, Oral, Daily PRN   ondansetron (ZOFRAN) 4 mg, Oral, Every 6 hours PRN   prednisoLONE acetate (PRED FORTE) 1 % ophthalmic suspension 1 drop, 4 times daily   promethazine-dextromethorphan (PROMETHAZINE-DM) 6.25-15 MG/5ML syrup 5 mLs, Oral, 4 times daily PRN   Spacer/Aero-Holding Chambers (AEROCHAMBER PLUS) inhaler Use with inhaler   traZODone (DESYREL) 25-50 mg, Oral, At bedtime PRN   ursodiol (ACTIGALL) 250 mg, Oral, 2 times daily    Diet Orders (From admission, onward)     Start     Ordered   08/10/22 0001  Diet NPO time specified  Diet effective midnight        01'$ /14/24 0854  DVT prophylaxis: SCDs Start: 08/13/2022 2213   Lab Results  Component Value Date   PLT 359 08/10/2022      Code Status: Full Code  Family Communication: None at bedside  Status is: Inpatient  Remains inpatient appropriate because: severity of illness  Level of care: Progressive  Consultants:  General surgery IR Nephrology  ID Vascular  Objective: Vitals:   08/10/22 0930 08/10/22 1000 08/10/22 1034 08/10/22 1036  BP: (!) 135/56 131/65 (!) 133/40 120/63  Pulse: 75 81 78 81  Resp: (!) 25 19  (!) 21 18  Temp:   97.6 F (36.4 C)   TempSrc:   Oral   SpO2: 97% 100% 93% 100%  Weight:    56.2 kg  Height:        Intake/Output Summary (Last 24 hours) at 08/10/2022 1433 Last data filed at 08/10/2022 1100 Gross per 24 hour  Intake 5 ml  Output 715 ml  Net -710 ml   Wt Readings from Last 3 Encounters:  08/10/22 56.2 kg  05/29/22 63.5 kg  02/19/22 61.7 kg    Examination:   Awake Alert, Oriented X 3, discomfort due to pain Symmetrical Chest wall movement, Good air movement bilaterally, CTAB RRR,No Gallops,Rubs or new Murmurs, No Parasternal Heave +ve B.Sounds, Abd Soft, quadrant drain present EMA bilaterally, but no ischemia or gangrene noted, no open wounds, left lower extremity more cool to palpation      Data Reviewed: I have independently reviewed following labs and imaging studies   CBC Recent Labs  Lab 08/06/22 0433 08/07/22 0627 08/08/22 0154 08/09/22 0502 08/10/22 0446  WBC 20.2* 20.9* 24.8* 27.1* 28.8*  HGB 12.0 11.5* 11.5* 11.6* 11.6*  HCT 37.5 36.6 34.2* 36.8 34.3*  PLT 382 420* 365 320 359  MCV 99.5 99.7 95.8 101.9* 95.0  MCH 31.8 31.3 32.2 32.1 32.1  MCHC 32.0 31.4 33.6 31.5 33.8  RDW 13.3 13.3 13.2 13.2 13.0    Recent Labs  Lab 08/04/22 0438 08/05/22 0403 08/07/22 0627 08/09/22 0502 08/10/22 0446 08/10/22 0724  NA 131* 129* 131* 132* 128*  --   K 4.4 3.3* 3.1* 3.0* 3.2*  --   CL 92* 88* 88* 91* 90*  --   CO2 '25 25 24 22 '$ 21*  --   GLUCOSE 148* 152* 180* 159* 190*  --   BUN 32* 22 36* 34* 41*  --   CREATININE 8.10* 5.14* 5.33* 5.01* 6.06*  --   CALCIUM 9.6 9.4 9.5 10.0 10.0  --   AST 15 15  --   --   --   --   ALT 10 10  --   --   --   --   ALKPHOS 86 73  --   --   --   --   BILITOT 0.6 0.5  --   --   --   --   ALBUMIN 2.2* 2.2* 2.3* 2.3*  --   --   MG 2.1 2.0  --   --   --   --   PROCALCITON 2.02  --   --   --   --   --   LATICACIDVEN  --   --   --   --   --  1.2     ------------------------------------------------------------------------------------------------------------------ No results for input(s): "CHOL", "HDL", "LDLCALC", "TRIG", "CHOLHDL", "LDLDIRECT" in the last 72 hours.  Lab Results  Component Value Date   HGBA1C 6.2 (H) 08/03/2022   ------------------------------------------------------------------------------------------------------------------ No results for input(s): "TSH", "T4TOTAL", "T3FREE", "  THYROIDAB" in the last 72 hours.  Invalid input(s): "FREET3"   Cardiac Enzymes No results for input(s): "CKMB", "TROPONINI", "MYOGLOBIN" in the last 168 hours.  Invalid input(s): "CK" ------------------------------------------------------------------------------------------------------------------    Component Value Date/Time   BNP 1,676.4 (H) 08/03/2022 0008    CBG: Recent Labs  Lab 08/09/22 1326 08/09/22 1705 08/09/22 2048 08/10/22 0657 08/10/22 1141  GLUCAP 134* 264* 225* 202* 145*    Recent Results (from the past 240 hour(s))  Aerobic/Anaerobic Culture w Gram Stain (surgical/deep wound)     Status: None   Collection Time: 08/03/22  1:26 PM   Specimen: Wound; Abscess  Result Value Ref Range Status   Specimen Description WOUND  Final   Special Requests  ABCESS ABDOMEN  Final   Gram Stain   Final    ABUNDANT WBC PRESENT, PREDOMINANTLY PMN ABUNDANT GRAM NEGATIVE RODS FEW GRAM POSITIVE COCCI IN PAIRS IN CHAINS FEW GRAM POSITIVE RODS    Culture   Final    ABUNDANT ESCHERICHIA COLI MODERATE KLEBSIELLA PNEUMONIAE ABUNDANT BACTEROIDES THETAIOTAOMICRON BETA LACTAMASE POSITIVE Performed at [facility], [place] [address] [place], [place] [address]    Report Status 08/08/2022 FINAL  Final   Organism ID, Bacteria ESCHERICHIA COLI  Final   Organism ID, Bacteria KLEBSIELLA PNEUMONIAE  Final      Susceptibility   Escherichia coli - MIC*    AMPICILLIN >=32 RESISTANT Resistant     CEFAZOLIN <=4 SENSITIVE Sensitive      CEFEPIME <=0.12 SENSITIVE Sensitive     CEFTAZIDIME <=1 SENSITIVE Sensitive     CEFTRIAXONE <=0.25 SENSITIVE Sensitive     CIPROFLOXACIN <=0.25 SENSITIVE Sensitive     GENTAMICIN <=1 SENSITIVE Sensitive     IMIPENEM <=0.25 SENSITIVE Sensitive     TRIMETH/SULFA <=20 SENSITIVE Sensitive     AMPICILLIN/SULBACTAM >=32 RESISTANT Resistant     PIP/TAZO <=4 SENSITIVE Sensitive     * ABUNDANT ESCHERICHIA COLI   Klebsiella pneumoniae - MIC*    AMPICILLIN RESISTANT Resistant     CEFAZOLIN <=4 SENSITIVE Sensitive     CEFEPIME <=0.12 SENSITIVE Sensitive     CEFTAZIDIME <=1 SENSITIVE Sensitive     CEFTRIAXONE <=0.25 SENSITIVE Sensitive     CIPROFLOXACIN <=0.25 SENSITIVE Sensitive     GENTAMICIN <=1 SENSITIVE Sensitive     IMIPENEM <=0.25 SENSITIVE Sensitive     TRIMETH/SULFA <=20 SENSITIVE Sensitive     AMPICILLIN/SULBACTAM 4 SENSITIVE Sensitive     PIP/TAZO <=4 SENSITIVE Sensitive     * MODERATE KLEBSIELLA PNEUMONIAE     Radiology Studies: NM Hepatobiliary Liver Func  Result Date: 08/10/2022 CLINICAL DATA:  Concern for acute cholecystitis. EXAM: NUCLEAR MEDICINE HEPATOBILIARY IMAGING TECHNIQUE: Sequential images of the abdomen were obtained out to 60 minutes following intravenous administration of radiopharmaceutical. RADIOPHARMACEUTICALS:  5.2 mCi Tc-78m Choletec IV COMPARISON:  CT August 06, 2022. FINDINGS: Prompt uptake and biliary excretion of activity by the liver is seen. Gallbladder activity is visualized, consistent with patency of cystic duct. Biliary activity passes into small bowel, consistent with patent common bile duct. IMPRESSION: Patent cystic and common bile ducts without scintigraphic evidence of acute cholecystitis. Electronically Signed   By: JDahlia BailiffM.D.   On: 08/10/2022 13:39   [place] SITE RITE  Result Date: 08/10/2022 If Site Rite image not attached, placement could not be confirmed due to current cardiac rhythm.    DPhillips ClimesMD [facility]  Between 7 am - 7 pm I am available, please contact me via Amion (for emergencies)  or Securechat (non urgent messages)  Between 7 pm - 7 am I am not available, please contact night coverage MD/APP via Amion

## 2022-08-10 NOTE — Progress Notes (Signed)
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 She is s/p ir drain placement with improved abscess size but complicated course with leukocytosis       Micro: 1/08 fresh drain abd abscess cx - ecoli (R amp-sulb; S cefazolin, cipro, bactrim), bacteroides, kleb pna (S cefazolin, cipro, bactrim) 1/08 acute hep B panel positive hep B sAb, negative sAg   Procedures/imaging: 1/08 ir placed abscess percutaneous drain; 30 cc purulent fluid retrieved 1/11 repeat abd pelv ct slight improvement size of abscess   Patient has had rising leukocytosis depsite improving abscess size, good drain placement location, and appropriate abx. She has no sign of sepsis physiology otherwise. Her thrombocytosis however, is improving and clinically she is appearing/feeling well   Piptazo is a very good medication for intraabd abscess outside of esbl which was not retrieved. It does miss candida species but often we don't empirically start patient on this unless retrievable culture for such.    She has hurthle cell cancer, complex ovarian mass and left renal mass and we might raise question if they are active in any of these leukocytosis response.    She does have new onset diarrhea 24 hours and the left lower extremity is cold and a lot more tender than the RLE. I spoke with dr [facility] who reported  removing and readding her gabapentin with some improved in bilateral chronic LE pain, however agree the cold sensation is concerning   No sign of drug rash Her lft was normal until 1/10. Gen surg team is planning hida to look for potential acalculous cholecystitis   At this time, I would keep her on current antibiotics vs switching to cefpodoxim/flagyl to finish course and continue to monitor wbc rise of unclear infectious significance at this time   ----------------------------- 08/10/22 Vascular surgery had seen patient today and concerned for more of a subacute/chronic process rather than acute limb ischemia. Further angio study to be done. She is awaiting hida.  Wbc trend up slowing down No frank watery diarrhea   Also will avoid salt/volume load and pseudomonas coverage by transitioning piptazo to ceftriaxone/flagyl for now    Plan: Change piptazo to ceftriaxone/flagyl Await vascular surgery w/u on her chronic ischemic legs Await hida Will monitor her diarrhea. If this gets worse and nothing else obvious in terms of contributing to leukocytosis, we might have to look more closely at cdiff evaluation She needs outpatient f/u with endocrinology/urology/gynecology for other chest/abd/pelv ct finding as mentioned  Discussed with primary team   I spent more than 35 minute reviewing data/chart, and coordinating care and >50% direct  face to face time providing counseling/discussing diagnostics/treatment plan with patient   Principal Problem:   Abdominal infection ([facility]) Active Problems:   Intra-abdominal abscess ([facility])   Painful and cold lower extremity   Allergies  Allergen Reactions   Adhesive  [Tape] Hives   Lactose Intolerance (Gi) Diarrhea   Lisinopril Cough   Prednisone Swelling and Other (See Comments)    Excessive fluid buildup    Scheduled Meds:  acidophilus  1 capsule Oral Daily   atorvastatin  40 mg Oral QHS   carvedilol  12.5 mg Oral Q M,W,F-HD   carvedilol   12.5 mg Oral 2 times per day on Tue Thu Sat   Chlorhexidine Gluconate Cloth  6 each Topical Q0600   doxercalciferol  7 mcg Intravenous Q M,W,F-HD   ferric citrate  420 mg Oral TID WC   fluticasone furoate-vilanterol  1 puff Inhalation Daily    HYDROmorphone (DILAUDID) injection  0.5 mg Intravenous Once   insulin aspart  0-6 Units Subcutaneous TID WC   isosorbide-hydrALAZINE  1 tablet Oral 2 times per day on Sun Tue Thu Sat   isosorbide-hydrALAZINE  1 tablet Oral Q M,W,F-HD   metroNIDAZOLE  500 mg Oral Q12H   ondansetron (ZOFRAN) IV  4 mg Intravenous Once   pantoprazole  40 mg Oral BID   sodium chloride flush  5 mL Intracatheter Q8H   ursodiol  300 mg Oral BID   Continuous Infusions:  cefTRIAXone (ROCEPHIN)  IV     heparin     PRN Meds:.albuterol, busPIRone, fluticasone, hydrALAZINE, ondansetron (ZOFRAN) IV, ondansetron, oxyCODONE, prochlorperazine, traZODone   SUBJECTIVE: Patient evaluated by vascular Lots of leg pain today  Afebrile Wbc up Nausea Stool not diarrhea and slowing down. Mushy consistency per flow sheet documentation  Review of Systems: ROS All other ROS was negative, except mentioned above     OBJECTIVE: Vitals:   08/10/22 0930 08/10/22 1000 08/10/22 1034 08/10/22 1036  BP: (!) 135/56 131/65 (!) 133/40 120/63  Pulse: 75 81 78 81  Resp: (!) 25 19 (!) 21 18  Temp:   97.6 F (36.4 C)   TempSrc:   Oral   SpO2: 97% 100% 93% 100%  Weight:    56.2 kg  Height:       Body mass index is 20.62 kg/m.  Physical Exam General/constitutional: complains of legs pain; cooperative HEENT: Normocephalic, PER, Conj Clear, EOMI, Oropharynx clear Neck supple CV: rrr no mrg Lungs: clear to auscultation, normal respiratory effort Abd: Soft, Nontender Ext: no edema Skin: No Rash Neuro: nonfocal MSK: tender bilateral LE L>R   Lab Results Lab Results  Component Value Date   WBC 28.8 (H) 08/10/2022   HGB 11.6 (L) 08/10/2022   HCT 34.3 (L) 08/10/2022   MCV 95.0  08/10/2022   PLT 359 08/10/2022    Lab Results  Component Value Date   CREATININE 6.06 (H) 08/10/2022   BUN 41 (H) 08/10/2022   NA 128 (L) 08/10/2022   K 3.2 (L) 08/10/2022   CL 90 (L) 08/10/2022   CO2 21 (L) 08/10/2022    Lab Results  Component Value Date   ALT 10 08/05/2022   AST 15 08/05/2022   ALKPHOS 73 08/05/2022   BILITOT 0.5 08/05/2022      Microbiology: Recent Results (from the past 240 hour(s))  Aerobic/Anaerobic Culture w Gram Stain (surgical/deep wound)     Status: None   Collection Time: 08/03/22  1:26 PM   Specimen: Wound; Abscess  Result Value Ref Range Status  Specimen Description WOUND  Final   Special Requests  ABCESS ABDOMEN  Final   Gram Stain   Final    ABUNDANT WBC PRESENT, PREDOMINANTLY PMN ABUNDANT GRAM NEGATIVE RODS FEW GRAM POSITIVE COCCI IN PAIRS IN CHAINS FEW GRAM POSITIVE RODS    Culture   Final    ABUNDANT ESCHERICHIA COLI MODERATE KLEBSIELLA PNEUMONIAE ABUNDANT BACTEROIDES THETAIOTAOMICRON BETA LACTAMASE POSITIVE Performed at [facility], [place] [address] [place], [place] [address]    Report Status 08/08/2022 FINAL  Final   Organism ID, Bacteria ESCHERICHIA COLI  Final   Organism ID, Bacteria KLEBSIELLA PNEUMONIAE  Final      Susceptibility   Escherichia coli - MIC*    AMPICILLIN >=32 RESISTANT Resistant     CEFAZOLIN <=4 SENSITIVE Sensitive     CEFEPIME <=0.12 SENSITIVE Sensitive     CEFTAZIDIME <=1 SENSITIVE Sensitive     CEFTRIAXONE <=0.25 SENSITIVE Sensitive     CIPROFLOXACIN <=0.25 SENSITIVE Sensitive     GENTAMICIN <=1 SENSITIVE Sensitive     IMIPENEM <=0.25 SENSITIVE Sensitive     TRIMETH/SULFA <=20 SENSITIVE Sensitive     AMPICILLIN/SULBACTAM >=32 RESISTANT Resistant     PIP/TAZO <=4 SENSITIVE Sensitive     * ABUNDANT ESCHERICHIA COLI   Klebsiella pneumoniae - MIC*    AMPICILLIN RESISTANT Resistant     CEFAZOLIN <=4 SENSITIVE Sensitive     CEFEPIME <=0.12 SENSITIVE Sensitive     CEFTAZIDIME <=1 SENSITIVE  Sensitive     CEFTRIAXONE <=0.25 SENSITIVE Sensitive     CIPROFLOXACIN <=0.25 SENSITIVE Sensitive     GENTAMICIN <=1 SENSITIVE Sensitive     IMIPENEM <=0.25 SENSITIVE Sensitive     TRIMETH/SULFA <=20 SENSITIVE Sensitive     AMPICILLIN/SULBACTAM 4 SENSITIVE Sensitive     PIP/TAZO <=4 SENSITIVE Sensitive     * MODERATE KLEBSIELLA PNEUMONIAE     Serology:   Imaging: If present, new imagings (plain films, ct scans, and mri) have been personally visualized and interpreted; radiology reports have been reviewed. Decision making incorporated into the Impression / Recommendations.  1/11 ct abd pelv with contrast 1. Significant interval decrease in size of the right pelvic abscess status post percutaneous drainage, with pigtail drainage catheter appropriately positioned. There are a few residual tiny foci of internal gas, but no significant residual fluid component. The tip of the appendix terminates within and is inseparable from this area, again suspicious for perforated appendicitis. There are several loops of small bowel adjacent to this area, but without obvious fistula or small-bowel inflammatory change. This area remains inseparable from the right ovary. 2. Unchanged 3.8 cm complex right ovarian mass with internal calcification, previously characterized on prior ultrasound. Gynecological consultation was recommended at that time. 3. Unchanged 2.1 cm heterogeneously enhancing left upper pole renal lesion, suspicious for primary renal neoplasm. Outpatient non-emergent follow-up renal protocol CT or MRI with and without contrast is recommended for further evaluation. 4. Unchanged cholelithiasis. 5.  Aortic Atherosclerosis     1/09 cxr No acute cardiopulm process     1/07 abd pelv ct with contrast 1. Interval enlargement of the previously noted loculated collection along the right pelvic sidewall with irregular mural enhancement, enhancing internal septa, and mild surrounding  inflammatory stranding. Additionally, there has developed gas within the superior aspect of the mass, best seen on axial image # 68/3. When compared to remote prior examination of 10/15/2020, this is seen immediately adjacent to the appendix and complex right adnexal cystic lesion. Altogether, the findings are suspicious for a perforated  appendicitis with a periappendiceal abscess with probable secondary involvement of the previously noted adnexal mass or a tubo-ovarian abscess. A malignant lesion is considered less likely given its relatively rapid evolution though a secondarily infected malignancy could appear in this fashion. Surgical consultation is recommended. 2. 2.1 cm hypoenhancing lesion within the upper pole of the left kidney suspicious for a primary renal neoplasm. Dedicated renal mass protocol CT or MRI examination is recommended for evaluation once the patient's acute issues have resolved. 3. Cholelithiasis. 4. Small right fat containing femoral hernia. Previously noted bowel within the hernia sac has spontaneously reduced.     1/07 ruq [place] 1. Interval enlargement of the previously noted loculated collection along the right pelvic sidewall with irregular mural enhancement, enhancing internal septa, and mild surrounding inflammatory stranding. Additionally, there has developed gas within the superior aspect of the mass, best seen on axial image # 68/3. When compared to remote prior examination of 10/15/2020, this is seen immediately adjacent to the appendix and complex right adnexal cystic lesion. Altogether, the findings are suspicious for a perforated appendicitis with a periappendiceal abscess with probable secondary involvement of the previously noted adnexal mass or a tubo-ovarian abscess. A malignant lesion is considered less likely given its relatively rapid evolution though a secondarily infected malignancy could appear in this fashion. Surgical consultation is  recommended. 2. 2.1 cm hypoenhancing lesion within the upper pole of the left kidney suspicious for a primary renal neoplasm. Dedicated renal mass protocol CT or MRI examination is recommended for evaluation once the patient's acute issues have resolved. 3. Cholelithiasis. 4. Small right fat containing femoral hernia. Previously noted bowel within the hernia sac has spontaneously reduced.     Jabier Mutton, [facility] [place] [phone] pager    08/10/2022, 1:26 PM

## 2022-08-10 NOTE — Procedures (Signed)
Patient seen and examined on Hemodialysis. BP (!) 138/57 (BP Location: Right Arm)   Pulse 70   Temp (!) 97.4 F (36.3 C) (Oral)   Resp 15   Ht '5\' 5"'$  (1.651 m)   Wt 56.9 kg   SpO2 95%   BMI 20.87 kg/m   QB 200 mL/ min via L AVG, UF goal 1L  Tolerating treatment without complaints at this time.     Madelon Lips MD [facility] [phone] 9:13 AM

## 2022-08-11 ENCOUNTER — Encounter (HOSPITAL_COMMUNITY): Payer: Medicare Other

## 2022-08-11 ENCOUNTER — Ambulatory Visit: Payer: Medicare Other | Admitting: Allergy & Immunology

## 2022-08-11 DIAGNOSIS — R209 Unspecified disturbances of skin sensation: Secondary | ICD-10-CM | POA: Diagnosis not present

## 2022-08-11 DIAGNOSIS — M79606 Pain in leg, unspecified: Secondary | ICD-10-CM

## 2022-08-11 DIAGNOSIS — K651 Peritoneal abscess: Secondary | ICD-10-CM

## 2022-08-11 DIAGNOSIS — M009 Pyogenic arthritis, unspecified: Secondary | ICD-10-CM | POA: Diagnosis not present

## 2022-08-11 DIAGNOSIS — K659 Peritonitis, unspecified: Secondary | ICD-10-CM | POA: Diagnosis not present

## 2022-08-11 LAB — GLUCOSE, CAPILLARY
Glucose-Capillary: 184 mg/dL — ABNORMAL HIGH (ref 70–99)
Glucose-Capillary: 188 mg/dL — ABNORMAL HIGH (ref 70–99)
Glucose-Capillary: 179 mg/dL — ABNORMAL HIGH (ref 70–99)
Glucose-Capillary: 131 mg/dL — ABNORMAL HIGH (ref 70–99)

## 2022-08-11 LAB — CBC
HCT: 39.7 % (ref 36.0–46.0)
Hemoglobin: 12.9 g/dL (ref 12.0–15.0)
MCH: 31.5 pg (ref 26.0–34.0)
MCHC: 32.5 g/dL (ref 30.0–36.0)
MCV: 96.8 fL (ref 80.0–100.0)
Platelets: 411 10*3/uL — ABNORMAL HIGH (ref 150–400)
RBC: 4.1 MIL/uL (ref 3.87–5.11)
RDW: 13.3 % (ref 11.5–15.5)
WBC: 28.1 10*3/uL — ABNORMAL HIGH (ref 4.0–10.5)
nRBC: 0.1 % (ref 0.0–0.2)

## 2022-08-11 LAB — HEPARIN LEVEL (UNFRACTIONATED): Heparin Unfractionated: 0.61 IU/mL (ref 0.30–0.70)

## 2022-08-11 MED ORDER — NEPRO/CARBSTEADY PO LIQD
237.0000 mL | Freq: Two times a day (BID) | ORAL | Status: DC
  Administered 2022-08-11 – 2022-08-19 (×7): 237 mL via ORAL

## 2022-08-11 MED ORDER — GERHARDT'S BUTT CREAM
TOPICAL_CREAM | Freq: Three times a day (TID) | CUTANEOUS | Status: DC
  Administered 2022-08-23 – 2022-08-26 (×4): 1 via TOPICAL
  Filled 2022-08-11 (×2): qty 1

## 2022-08-11 MED ORDER — RENA-VITE PO TABS
1.0000 | ORAL_TABLET | Freq: Every day | ORAL | Status: DC
  Administered 2022-08-11 – 2022-08-26 (×16): 1 via ORAL
  Filled 2022-08-11 (×16): qty 1

## 2022-08-11 MED ORDER — AMLODIPINE BESYLATE 5 MG PO TABS
5.0000 mg | ORAL_TABLET | Freq: Every day | ORAL | Status: DC
  Administered 2022-08-11 – 2022-08-12 (×2): 5 mg via ORAL
  Filled 2022-08-11 (×2): qty 1

## 2022-08-11 NOTE — Progress Notes (Signed)
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 She is s/p ir drain placement with improved abscess size but complicated course with leukocytosis       Micro: 1/08 fresh drain abd abscess cx - ecoli (R amp-sulb; S cefazolin, cipro, bactrim), bacteroides, kleb pna (S cefazolin, cipro, bactrim) 1/08 acute hep B panel positive hep B sAb, negative sAg   Procedures/imaging: 1/08 ir placed abscess percutaneous drain; 30 cc purulent fluid retrieved 1/11 repeat abd pelv ct slight improvement size of abscess 1/15 CTA with run-off showed increased right pelvic abscess and also new presacral fluid collection   W/u so far for increased leukocytosis size now 08/11/22 circles back to abdominal abscess process given most recent ct finding from 1/15. Negative hida. No s/s to suggest cdiff. And doesn't appear there is acute critical limb ischemia/necrosis at this time  We had switched abx to ceftriaxone/flagyl from piptazo to avoid salt/volume load     Spoke with surgery 1/16 who plans to ask ir to adjust drain and discuss presacral fluid material finding    Plan: Continue ceftriaxone flagyl Pending further general surgery and vascular surgery input She needs outpatient f/u with endocrinology/urology/gynecology for other chest/abd/pelv ct  finding as mentioned  Discussed with primary team    I spent 50 minute reviewing data/chart, and coordinating care and >50% direct face to face time providing counseling/discussing diagnostics/treatment plan with patient    Principal Problem:   Abdominal infection ([place]) Active Problems:   Intra-abdominal abscess ([place])   Painful and cold lower extremity   Allergies  Allergen Reactions   Adhesive  [Tape] Hives   Lactose Intolerance (Gi) Diarrhea   Lisinopril Cough   Prednisone Swelling and Other (See Comments)    Excessive fluid buildup    Scheduled Meds:  acidophilus  1 capsule Oral Daily   amLODipine  5 mg Oral QHS   atorvastatin  40 mg Oral QHS   carvedilol  12.5 mg Oral Q M,W,F-HD   carvedilol  12.5 mg Oral 2 times per day on Tue Thu Sat   Chlorhexidine Gluconate Cloth  6 each Topical Q0600   doxercalciferol  7 mcg Intravenous Q M,W,F-HD   ferric citrate  420 mg Oral TID WC   fluticasone furoate-vilanterol  1 puff Inhalation Daily   Gerhardt's butt cream   Topical TID   insulin aspart  0-6 Units Subcutaneous TID WC   isosorbide-hydrALAZINE  1 tablet Oral 2 times per day on Sun Tue Thu Sat   isosorbide-hydrALAZINE  1 tablet Oral Q M,W,F-HD   metroNIDAZOLE  500  mg Oral Q12H   pantoprazole  40 mg Oral BID   sodium chloride flush  5 mL Intracatheter Q8H   ursodiol  300 mg Oral BID   Continuous Infusions:  cefTRIAXone (ROCEPHIN)  IV Stopped (08/10/22 2346)   heparin 750 Units/hr (08/10/22 2127)   PRN Meds:.albuterol, busPIRone, fluticasone, hydrALAZINE, ondansetron (ZOFRAN) IV, ondansetron, oxyCODONE, prochlorperazine, traZODone   SUBJECTIVE: Continued significant pain LLE > RLE  Afebrile No diarrhea Cta reviewed Discussed with gen-surg team    Review of Systems: ROS All other ROS was negative, except mentioned above     OBJECTIVE: Vitals:   08/10/22 2109 08/11/22 0106 08/11/22 0422 08/11/22 0800  BP: (!) 173/50 (!) 141/46 (!) 176/62 (!) 181/48   Pulse: 83 78 83 79  Resp: '17 18 15 15  '$ Temp: 98.2 F (36.8 C) 98.5 F (36.9 C) 98 F (36.7 C)   TempSrc: Oral Oral Oral   SpO2: 97% 97% 96% 95%  Weight:      Height:       Body mass index is 20.62 kg/m.  Physical Exam General/constitutional: no distress, pleasant HEENT: Normocephalic, PER, Conj Clear, EOMI, Oropharynx clear Neck supple CV: rrr no mrg Lungs: clear to auscultation, normal respiratory effort  Abd: Soft, Nontender - jp drain functioning  Ext: no edema Skin: No Rash Neuro: nonfocal   MSK: bilateral tma; tender bilateral LE L>R   Lab Results Lab Results  Component Value Date   WBC 28.1 (H) 08/11/2022   HGB 12.9 08/11/2022   HCT 39.7 08/11/2022   MCV 96.8 08/11/2022   PLT 411 (H) 08/11/2022    Lab Results  Component Value Date   CREATININE 6.06 (H) 08/10/2022   BUN 41 (H) 08/10/2022   NA 128 (L) 08/10/2022   K 3.2 (L) 08/10/2022   CL 90 (L) 08/10/2022   CO2 21 (L) 08/10/2022    Lab Results  Component Value Date   ALT 10 08/05/2022   AST 15 08/05/2022   ALKPHOS 73 08/05/2022   BILITOT 0.5 08/05/2022      Microbiology: Recent Results (from the past 240 hour(s))  Aerobic/Anaerobic Culture w Gram Stain (surgical/deep wound)     Status: None   Collection Time: 08/03/22  1:26 PM   Specimen: Wound; Abscess  Result Value Ref Range Status   Specimen Description WOUND  Final   Special Requests  ABCESS ABDOMEN  Final   Gram Stain   Final    ABUNDANT WBC PRESENT, PREDOMINANTLY PMN ABUNDANT GRAM NEGATIVE RODS FEW GRAM POSITIVE COCCI IN PAIRS IN CHAINS FEW GRAM POSITIVE RODS    Culture   Final    ABUNDANT ESCHERICHIA COLI MODERATE KLEBSIELLA PNEUMONIAE ABUNDANT BACTEROIDES THETAIOTAOMICRON BETA LACTAMASE POSITIVE Performed at [facility], [place] [address], [place], [place] [address]    Report Status 08/08/2022 FINAL  Final   Organism ID, Bacteria ESCHERICHIA COLI  Final   Organism ID, Bacteria KLEBSIELLA PNEUMONIAE  Final       Susceptibility   Escherichia coli - MIC*    AMPICILLIN >=32 RESISTANT Resistant     CEFAZOLIN <=4 SENSITIVE Sensitive     CEFEPIME <=0.12 SENSITIVE Sensitive     CEFTAZIDIME <=1 SENSITIVE Sensitive     CEFTRIAXONE <=0.25 SENSITIVE Sensitive     CIPROFLOXACIN <=0.25 SENSITIVE Sensitive     GENTAMICIN <=1 SENSITIVE Sensitive     IMIPENEM <=0.25 SENSITIVE Sensitive     TRIMETH/SULFA <=20 SENSITIVE Sensitive     AMPICILLIN/SULBACTAM >=32 RESISTANT Resistant     PIP/TAZO <=4  SENSITIVE Sensitive     * ABUNDANT ESCHERICHIA COLI   Klebsiella pneumoniae - MIC*    AMPICILLIN RESISTANT Resistant     CEFAZOLIN <=4 SENSITIVE Sensitive     CEFEPIME <=0.12 SENSITIVE Sensitive     CEFTAZIDIME <=1 SENSITIVE Sensitive     CEFTRIAXONE <=0.25 SENSITIVE Sensitive     CIPROFLOXACIN <=0.25 SENSITIVE Sensitive     GENTAMICIN <=1 SENSITIVE Sensitive     IMIPENEM <=0.25 SENSITIVE Sensitive     TRIMETH/SULFA <=20 SENSITIVE Sensitive     AMPICILLIN/SULBACTAM 4 SENSITIVE Sensitive     PIP/TAZO <=4 SENSITIVE Sensitive     * MODERATE KLEBSIELLA PNEUMONIAE     Serology:   Imaging: If present, new imagings (plain films, ct scans, and mri) have been personally visualized and interpreted; radiology reports have been reviewed. Decision making incorporated into the Impression / Recommendations.  1/15 hida scan No evidence cholecystitis  1/15 cta abd aorta with run-off IMPRESSION: VASCULAR   1. Severe atherosclerotic disease throughout the abdomen, pelvis and bilateral lower extremities. Aortic Atherosclerosis (ICD10-I70.0). 2. Bilateral inflow disease. Stenosis involving bilateral external iliac arteries. 3. 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 4. Right femoropopliteal bypass graft is patent but limited evaluation due to motion artifact. 5. Very limited evaluation of  the bilateral runoff vessels due to severe vascular calcifications. Likely single-vessel runoff on the left from the peroneal artery. At least 2 vessel runoff in the right lower extremity as described. 6. 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 7. Bilateral renal artery stenosis.   NON-VASCULAR   1. Complex collection in the right hemipelvis has slightly enlarged in size since 08/06/2022. The anterior percutaneous drain is still in place and located along the anterior aspect of the collection. Slightly limited evaluation of this structure due to the phase of contrast. 2. Concern for fluid or material just posterior to the rectum and just inferior to the coccyx. There may also be material or fluid tracking in the posterior pelvic subcutaneous tissues near the coccyx. Recommend attention to this area on follow up imaging. 3. Cholelithiasis.   1/11 ct abd pelv with contrast 1. Significant interval decrease in size of the right pelvic abscess status post percutaneous drainage, with pigtail drainage catheter appropriately positioned. There are a few residual tiny foci of internal gas, but no significant residual fluid component. The tip of the appendix terminates within and is inseparable from this area, again suspicious for perforated appendicitis. There are several loops of small bowel adjacent to this area, but without obvious fistula or small-bowel inflammatory change. This area remains inseparable from the right ovary. 2. Unchanged 3.8 cm complex right ovarian mass with internal calcification, previously characterized on prior ultrasound. Gynecological consultation was recommended at that time. 3. Unchanged 2.1 cm heterogeneously enhancing left upper pole renal lesion, suspicious for primary renal neoplasm. Outpatient non-emergent follow-up renal protocol CT or MRI with and without contrast is recommended for further evaluation. 4. Unchanged  cholelithiasis. 5.  Aortic Atherosclerosis     1/09 cxr No acute cardiopulm process     1/07 abd pelv ct with contrast 1. Interval enlargement of the previously noted loculated collection along the right pelvic sidewall with irregular mural enhancement, enhancing internal septa, and mild surrounding inflammatory stranding. Additionally, there has developed gas within the superior aspect of the mass, best seen on axial image # 68/3. When compared to remote prior  examination of 10/15/2020, this is seen immediately adjacent to the appendix and complex right adnexal cystic lesion. Altogether, the findings are suspicious for a perforated appendicitis with a periappendiceal abscess with probable secondary involvement of the previously noted adnexal mass or a tubo-ovarian abscess. A malignant lesion is considered less likely given its relatively rapid evolution though a secondarily infected malignancy could appear in this fashion. Surgical consultation is recommended. 2. 2.1 cm hypoenhancing lesion within the upper pole of the left kidney suspicious for a primary renal neoplasm. Dedicated renal mass protocol CT or MRI examination is recommended for evaluation once the patient's acute issues have resolved. 3. Cholelithiasis. 4. Small right fat containing femoral hernia. Previously noted bowel within the hernia sac has spontaneously reduced.     1/07 ruq [place] 1. Interval enlargement of the previously noted loculated collection along the right pelvic sidewall with irregular mural enhancement, enhancing internal septa, and mild surrounding inflammatory stranding. Additionally, there has developed gas within the superior aspect of the mass, best seen on axial image # 68/3. When compared to remote prior examination of 10/15/2020, this is seen immediately adjacent to the appendix and complex right adnexal cystic lesion. Altogether, the findings are suspicious for a perforated appendicitis  with a periappendiceal abscess with probable secondary involvement of the previously noted adnexal mass or a tubo-ovarian abscess. A malignant lesion is considered less likely given its relatively rapid evolution though a secondarily infected malignancy could appear in this fashion. Surgical consultation is recommended. 2. 2.1 cm hypoenhancing lesion within the upper pole of the left kidney suspicious for a primary renal neoplasm. Dedicated renal mass protocol CT or MRI examination is recommended for evaluation once the patient's acute issues have resolved. 3. Cholelithiasis. 4. Small right fat containing femoral hernia. Previously noted bowel within the hernia sac has spontaneously reduced.     Jabier Mutton, [facility] [place] [phone] pager    08/11/2022, 12:16 PM

## 2022-08-11 NOTE — Progress Notes (Signed)
[facility] Progress Note   Subjective:    Seen and examined at bedside. Net UF 749m. Jerking movements improved. She was concerned that she had dialysis today -she really wanted me to know that she did not want to go to dialysis today. Reminded her that today is not her treatment day and she was relieved.   HIDA scan was neg for cholecystitis    Objective Vitals:   08/10/22 2109 08/11/22 0106 08/11/22 0422 08/11/22 0800  BP: (!) 173/50 (!) 141/46 (!) 176/62 (!) 181/48  Pulse: 83 78 83 79  Resp: '17 18 15 15  '$ Temp: 98.2 F (36.8 C) 98.5 F (36.9 C) 98 F (36.7 C)   TempSrc: Oral Oral Oral   SpO2: 97% 97% 96% 95%  Weight:      Height:       Physical Exam General: Thin chronically ill elderly female Heart: RRR no MRG Lungs: CTA bilaterally nonlabored breathing Abdomen: NABS, soft, non-distended, non-tender with palpation today; RLQ drain placed in IR- dark fluid in tube but none in bag Extremities: No pedal edema Dialysis Access: LUA AVG+ bruit NEURO: lethargic, + myoclonus  Filed Weights   08/07/22 0903 08/10/22 0836 08/10/22 1036  Weight: 59.2 kg 56.9 kg 56.2 kg    Intake/Output Summary (Last 24 hours) at 08/11/2022 0934 Last data filed at 08/11/2022 0440 Gross per 24 hour  Intake --  Output 710 ml  Net -710 ml     Additional Objective Labs: Basic Metabolic Panel: Recent Labs  Lab 08/07/22 0627 08/09/22 0502 08/10/22 0446  NA 131* 132* 128*  K 3.1* 3.0* 3.2*  CL 88* 91* 90*  CO2 24 22 21*  GLUCOSE 180* 159* 190*  BUN 36* 34* 41*  CREATININE 5.33* 5.01* 6.06*  CALCIUM 9.5 10.0 10.0  PHOS 5.0* 4.1  --     Liver Function Tests: Recent Labs  Lab 08/05/22 0403 08/07/22 0627 08/09/22 0502  AST 15  --   --   ALT 10  --   --   ALKPHOS 73  --   --   BILITOT 0.5  --   --   PROT 6.0*  --   --   ALBUMIN 2.2* 2.3* 2.3*    No results for input(s): "LIPASE", "AMYLASE" in the last 168 hours.  CBC: Recent Labs  Lab 08/07/22 0627  08/08/22 0154 08/09/22 0502 08/10/22 0446 08/11/22 0612  WBC 20.9* 24.8* 27.1* 28.8* 28.1*  HGB 11.5* 11.5* 11.6* 11.6* 12.9  HCT 36.6 34.2* 36.8 34.3* 39.7  MCV 99.7 95.8 101.9* 95.0 96.8  PLT 420* 365 320 359 411*    Blood Culture    Component Value Date/Time   SDES WOUND 08/03/2022 1326   SPECREQUEST  ABCESS ABDOMEN 08/03/2022 1326   CULT  08/03/2022 1326    ABUNDANT ESCHERICHIA COLI MODERATE KLEBSIELLA PNEUMONIAE ABUNDANT BACTEROIDES THETAIOTAOMICRON BETA LACTAMASE POSITIVE [facility] at [facility] [id number], [place] [address]   REPTSTATUS 08/08/2022 FINAL 08/03/2022 1326    Cardiac Enzymes: No results for input(s): "CKTOTAL", "CKMB", "CKMBINDEX", "TROPONINI" in the last 168 hours. CBG: Recent Labs  Lab 08/10/22 0657 08/10/22 1141 08/10/22 1512 08/10/22 2013 08/11/22 0756  GLUCAP 202* 145* 154* 193* 184*    Iron Studies: No results for input(s): "IRON", "TIBC", "TRANSFERRIN", "FERRITIN" in the last 72 hours. Lab Results  Component Value Date   INR 1.0 08/03/2022   INR 1.1 05/17/2019   INR 1.02 08/23/2017   Studies/Results: CT ANGIO AO+BIFEM W &  OR WO CONTRAST  Result Date: 08/10/2022 CLINICAL DATA:  72 year old with rest pain. EXAM: CT ANGIOGRAPHY OF ABDOMINAL AORTA WITH ILIOFEMORAL RUNOFF TECHNIQUE: Multidetector CT imaging of the abdomen, pelvis and lower extremities was performed using the standard protocol during bolus administration of intravenous contrast. Multiplanar CT image reconstructions and MIPs were obtained to evaluate the vascular anatomy. RADIATION DOSE REDUCTION: This exam was performed according to the departmental dose-optimization program which includes automated exposure control, adjustment of the mA and/or kV according to patient size and/or use of iterative reconstruction technique. CONTRAST:  165m OMNIPAQUE IOHEXOL 350 MG/ML SOLN COMPARISON:  CT abdomen and pelvis 08/06/2022 FINDINGS: VASCULAR Aorta: Diffuse  circumferential calcifications involving the abdominal aorta without aneurysm or dissection. No significant aortic stenosis. Celiac: Celiac artery is patent with diffuse atherosclerotic calcifications. At least mild narrowing at the origin of the celiac trunk. No evidence for aneurysm or dissection involving the branch vessels. SMA: SMA is patent with at least moderate stenosis at the origin due to large amount of calcified plaque. Renals: Bilateral renal arteries are patent with diffuse atherosclerotic plaque. High-grade stenosis involving the right renal artery origin. At least mild to moderate stenosis in the proximal left renal artery. No aneurysm or dissection involving the renal arteries. IMA: Patent RIGHT Lower Extremity Inflow: Circumferential calcifications in the right common iliac artery without significant stenosis. High-grade stenosis or occlusion at the origin of the right internal iliac artery. Diffuse calcified plaque throughout the right external iliac artery. Stenosis in the right external artery is likely hemodynamically significant with scattered areas of narrowing from calcified plaque. Outflow: Postsurgical changes in the right groin and the right common femoral artery is patent. Right profunda femoral arteries are calcified but patent. Native SFA is occluded. Right femoropopliteal bypass graft is tortuous but patent. Graft ties into the distal popliteal artery near the knee. There appears to be some intraluminal stenosis in the bypass graft. Limited evaluation of the distal graft due to motion artifact. Suspect that the distal graft is patent. Popliteal artery just distal to the graft is very limited due to motion artifact. Runoff: Limited evaluation of the runoff vessels due to diffuse calcifications. However, there appears to be flow in the posterior tibial artery and peroneal artery. Evidence for segmental disease throughout the anterior tibial artery and limited evaluation. LEFT Lower  Extremity Inflow: Circumferential calcifications in left common iliac artery without significant stenosis. Probable high-grade stenosis at the origin left internal iliac artery. Narrowing in the mid and distal left external iliac artery from diffuse atherosclerotic disease. Outflow: Left common femoral artery is heavily calcified but patent. Origin of the left profunda femoral artery is heavily calcified. There is flow in the left profunda femoral arteries. Native left SFA is occluded. Left femoropopliteal bypass graft is occluded. Runoff: Limited evaluation due to calcified vessels. There appears to be flow in the left peroneal artery. Occlusive disease in the anterior tibial artery. There appears to be occlusive or segmental disease in the posterior tibial artery. Veins: No obvious venous abnormality within the limitations of this arterial phase study. Review of the MIP images confirms the above findings. NON-VASCULAR Lower chest: Bandlike atelectasis or scarring in the right lower lobe. Otherwise, the visualized lung bases are clear. Hepatobiliary: Evidence for cholelithiasis. Mild distention of the gallbladder is unchanged. Normal appearance of liver. Pancreas: Unremarkable. No pancreatic ductal dilatation or surrounding inflammatory changes. Spleen: Normal in size without focal abnormality. Adrenals/Urinary Tract: Adrenal glands are within normal limits. Atrophy in both kidneys compatible with history of  end-stage renal disease. Evidence for bilateral renal cysts that to do not require dedicated follow-up. Urinary bladder is decompressed. Small amount of contrast within the urinary bladder. Stomach/Bowel: High-density material throughout the small and large bowel without dilatation or obstruction. Normal appearance of the stomach. Again noted is material just posterior to the rectum and near the tip of the coccyx on image 135/6. This material measures roughly 2.0 x 2.2 cm and minimally changed from the exam on  08/06/2022. Concern for increased densities in the posterior subcutaneous tissues along the left posterior aspect of the coccyx on image 124/6. Developing subcutaneous fistula tract cannot be excluded. Lymphatic: No significant lymph node enlargement in the abdomen or pelvis. Reproductive: Status post hysterectomy. No evidence for a left adnexa mass. Again noted is complex structure in the region of the right adnexa containing a small focus of gas and small calcifications. There is a percutaneous drain along the anterior aspect of this complex collection. Collection measures approximately 4.6 x 3.9 cm on image 115/6 and roughly measured 4.1 x 3.5 cm on the exam from 08/06/2022. Other: No free fluid.  Negative for free air. Musculoskeletal: Bilateral transmetatarsal amputations. No acute bone abnormality. IMPRESSION: VASCULAR 1. Severe atherosclerotic disease throughout the abdomen, pelvis and bilateral lower extremities. Aortic Atherosclerosis (ICD10-I70.0). 2. Bilateral inflow disease. Stenosis involving bilateral external iliac arteries. 3. 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 4. Right femoropopliteal bypass graft is patent but limited evaluation due to motion artifact. 5. Very limited evaluation of the bilateral runoff vessels due to severe vascular calcifications. Likely single-vessel runoff on the left from the peroneal artery. At least 2 vessel runoff in the right lower extremity as described. 6. 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 7. Bilateral renal artery stenosis. NON-VASCULAR 1. Complex collection in the right hemipelvis has slightly enlarged in size since 08/06/2022. The anterior percutaneous drain is still in place and located along the anterior aspect of the collection. Slightly limited evaluation of this structure due to the phase of contrast. 2.  Concern for fluid or material just posterior to the rectum and just inferior to the coccyx. There may also be material or fluid tracking in the posterior pelvic subcutaneous tissues near the coccyx. Recommend attention to this area on follow up imaging. 3. Cholelithiasis. Electronically Signed   By: Markus Daft M.D.   On: 08/10/2022 17:27   NM Hepatobiliary Liver Func  Result Date: 08/10/2022 CLINICAL DATA:  Concern for acute cholecystitis. EXAM: NUCLEAR MEDICINE HEPATOBILIARY IMAGING TECHNIQUE: Sequential images of the abdomen were obtained out to 60 minutes following intravenous administration of radiopharmaceutical. RADIOPHARMACEUTICALS:  5.2 mCi Tc-33m Choletec IV COMPARISON:  CT August 06, 2022. FINDINGS: Prompt uptake and biliary excretion of activity by the liver is seen. Gallbladder activity is visualized, consistent with patency of cystic duct. Biliary activity passes into small bowel, consistent with patent common bile duct. IMPRESSION: Patent cystic and common bile ducts without scintigraphic evidence of acute cholecystitis. Electronically Signed   By: JDahlia BailiffM.D.   On: 08/10/2022 13:39   [place] SITE RITE  Result Date: 08/10/2022 If Site Rite image not attached, placement could not be confirmed due to current cardiac rhythm.   Medications:  cefTRIAXone (ROCEPHIN)  IV Stopped (08/10/22 2346)   heparin 750 Units/hr (08/10/22 2127)    acidophilus  1 capsule Oral Daily   atorvastatin  40 mg  Oral QHS   carvedilol  12.5 mg Oral Q M,W,F-HD   carvedilol  12.5 mg Oral 2 times per day on Tue Thu Sat   Chlorhexidine Gluconate Cloth  6 each Topical Q0600   doxercalciferol  7 mcg Intravenous Q M,W,F-HD   ferric citrate  420 mg Oral TID WC   fluticasone furoate-vilanterol  1 puff Inhalation Daily   insulin aspart  0-6 Units Subcutaneous TID WC   isosorbide-hydrALAZINE  1 tablet Oral 2 times per day on Sun Tue Thu Sat   isosorbide-hydrALAZINE  1 tablet Oral Q M,W,F-HD   metroNIDAZOLE   500 mg Oral Q12H   pantoprazole  40 mg Oral BID   sodium chloride flush  5 mL Intracatheter Q8H   ursodiol  300 mg Oral BID    Dialysis Orders: [place] MWF  4h  400/600   57.4kg   2/3.5 bath LUA AVG  Heparin 2500+ 1060mdrun - last HD 1/5, post wt 57.3kg - hectorol 7 ug IV tiw - venofer 50 q week - mircera 30 ug q4 wks, last 12/8, due now  Assessment/Plan: Abd pain/ intra-abdominal infection or abscess - per CT imaging. Gen surg and IR consulted, started on IV abx. IR placed drain in RLQ fluid collection. Fluid sent for culture, growing E coli/ Klebsiella. Bacteroides. Per pmd. Per general surgery, repeat CT showed interval decrease in size of pelvic fluid collection and drain in good position but WBC is continuing to trend up-now 28 Per general surgery, HIDA scan 08/10/22 to r/o cholecystitis - negative She remains afebrile. ESRD - on HD MWF. HD on schedule. Next HD 1/17.  HTN/ volume - euvolemic on exam.  Set UF goal 1-2L as tolerated. Resume home amlodipine.  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  Stopped gabapentin.  Rest per primary.  EMadelon LipsMD [facility] [phone]/16/2024,9:34 AM  LOS: 9 days

## 2022-08-11 NOTE — Progress Notes (Signed)
Physical Therapy Treatment Patient Details Name: Destiny Day MRN: [id number] DOB: 07/02/1951 Today's Date: 08/11/2022   History of Present Illness 72 year old female admitted 1/7  to the hospital with few days of right lower quadrant abdominal pain.  Imaging in the ED concerning for perforated appendicitis with a periappendiceal abscess with probably secondary involvement of the previously noted adnexal mass or a tubo-ovarian abscess.  She was placed on antibiotics, general surgery was consulted and she was admitted to the hospital. JP drain right lower quadrant placed.  PMH: ESRD on MWF HD, asthma/COPD, diastolic CHF, HTN, HLD, DM2    PT Comments    Patient alert with improved cognition compared to evaluation, however continues with decr cognition (? Pt's baseline). Follows simple commands, however self-distracts and loses track of what she is doing. Patient attempted to don socks and shoes and was unable to reach her feet. She was able to verbalize how the braces closed as PT did the task for her. Sit to stand required light mod assist of 2 and she then was able to step-pivot around to chair with RW and incr time.    Recommendations for follow up therapy are one component of a multi-disciplinary discharge planning process, led by the attending physician.  Recommendations may be updated based on patient status, additional functional criteria and insurance authorization.  Follow Up Recommendations  Home health PT (HHaide, HHOT)     Assistance Recommended at Discharge Intermittent Supervision/Assistance  Patient can return home with the following A little help with walking and/or transfers;A little help with bathing/dressing/bathroom;Assistance with cooking/housework;Assist for transportation;Help with stairs or ramp for entrance   Equipment Recommendations  Rolling walker (2 wheels);Wheelchair (measurements PT);Wheelchair cushion (measurements PT)    Recommendations for Other Services  OT consult     Precautions / Restrictions Precautions Precautions: Fall Precaution Comments: JP drain right lower quadrant Required Braces or Orthoses: Other Brace Other Brace: Pt has bil shoes with metal upright braces Restrictions Weight Bearing Restrictions: No     Mobility  Bed Mobility Overal bed mobility: Needs Assistance Bed Mobility: Rolling, Sidelying to Sit Rolling: Mod assist Sidelying to sit: Mod assist       General bed mobility comments: Requires repetition of cues due to decr attention and loses track of what was asked of her    Transfers Overall transfer level: Needs assistance Equipment used: Rolling walker (2 wheels), 2 person hand held assist Transfers: Sit to/from Stand, Bed to chair/wheelchair/BSC Sit to Stand: Mod assist, +2 physical assistance   Step pivot transfers: Min assist, +2 safety/equipment       General transfer comment: required moderate boost from EOB to standing; min assist for balance due to posterior lean during transfer    Ambulation/Gait             Pre-gait activities: pivotal steps to chair only     Stairs             Wheelchair Mobility    Modified Rankin (Stroke Patients Only)       Balance Overall balance assessment: Needs assistance Sitting-balance support: Bilateral upper extremity supported, No upper extremity supported, Feet supported Sitting balance-Leahy Scale: Poor Sitting balance - Comments: relies on external and UE support   Standing balance support: Bilateral upper extremity supported, Reliant on assistive device for balance Standing balance-Leahy Scale: Poor  Cognition Arousal/Alertness: Awake/alert Behavior During Therapy: Flat affect Overall Cognitive Status: No family/caregiver present to determine baseline cognitive functioning                                 General Comments: following simple commands but at times distracts  herself from task; convinced that the brief that tech pulled from her pack was not the type of brief she wanted, asked for pack and pt then pulled another one from pack (exactly the same) and was satisfied to use the second one        Exercises      General Comments General comments (skin integrity, edema, etc.): Incr time during session as pt self-distracts and requires repeat cuing to complete task.      Pertinent Vitals/Pain Pain Assessment Pain Assessment: No/denies pain    Home Living                          Prior Function            PT Goals (current goals can now be found in the care plan section) Acute Rehab PT Goals Patient Stated Goal: to go home Time For Goal Achievement: 08/22/22 Potential to Achieve Goals: Good Progress towards PT goals: Progressing toward goals    Frequency    Min 3X/week      PT Plan Current plan remains appropriate (if cognition continues to improve and pt requires min assist or less)    Co-evaluation              AM-PAC PT "6 Clicks" Mobility   Outcome Measure  Help needed turning from your back to your side while in a flat bed without using bedrails?: A Lot Help needed moving from lying on your back to sitting on the side of a flat bed without using bedrails?: A Lot Help needed moving to and from a bed to a chair (including a wheelchair)?: Total Help needed standing up from a chair using your arms (e.g., wheelchair or bedside chair)?: Total Help needed to walk in hospital room?: Total Help needed climbing 3-5 steps with a railing? : Total 6 Click Score: 8    End of Session Equipment Utilized During Treatment: Gait belt Activity Tolerance: Patient tolerated treatment well Patient left: with call bell/phone within reach;in chair;with chair alarm set Nurse Communication: Mobility status PT Visit Diagnosis: Muscle weakness (generalized) (M62.81)     Time: 2956-2130 PT Time Calculation (min) (ACUTE ONLY): 28  min  Charges:  $Gait Training: 8-22 mins $Therapeutic Activity: 8-22 mins                      Arby Barrette, PT Acute Rehabilitation Services  Office [phone]    Rexanne Mano 08/11/2022, 3:47 PM

## 2022-08-11 NOTE — Progress Notes (Signed)
Subjective/Chief Complaint: C/o extremity pain, abdominal pain, and buttock pain.  Denies nausea or vomiting. +BMs.   Daughter at bedside Objective: Vital signs in last 24 hours: Temp:  [98 F (36.7 C)-98.5 F (36.9 C)] 98 F (36.7 C) (01/16 0422) Pulse Rate:  [78-90] 79 (01/16 0800) Resp:  [15-21] 15 (01/16 0800) BP: (124-189)/(46-70) 181/48 (01/16 0800) SpO2:  [95 %-98 %] 95 % (01/16 0800) Last BM Date : 08/08/22  Intake/Output from previous day: 01/15 0701 - 01/16 0700 In: -  Out: 710 [Drains:10] Intake/Output this shift: Total I/O In: 120 [P.O.:120] Out: -   Exam: Awake and alert Abdomen soft, exam stable - RUQ and LLQ abd pain, drain appears feculent,  GU: labia and vulva appear normal - there is no redness, induration, or masses.   perianal region with some local skin breakdown and tenderness, without bleeding. There is no abscess or induration.   Lab Results:  Recent Labs    08/10/22 0446 08/11/22 0612  WBC 28.8* 28.1*  HGB 11.6* 12.9  HCT 34.3* 39.7  PLT 359 411*   BMET Recent Labs    08/09/22 0502 08/10/22 0446  NA 132* 128*  K 3.0* 3.2*  CL 91* 90*  CO2 22 21*  GLUCOSE 159* 190*  BUN 34* 41*  CREATININE 5.01* 6.06*  CALCIUM 10.0 10.0   PT/INR No results for input(s): "LABPROT", "INR" in the last 72 hours. ABG No results for input(s): "PHART", "HCO3" in the last 72 hours.  Invalid input(s): "PCO2", "PO2"  Studies/Results: CT ANGIO AO+BIFEM W & OR WO CONTRAST  Result Date: 08/10/2022 CLINICAL DATA:  72 year old with rest pain. EXAM: CT ANGIOGRAPHY OF ABDOMINAL AORTA WITH ILIOFEMORAL RUNOFF TECHNIQUE: Multidetector CT imaging of the abdomen, pelvis and lower extremities was performed using the standard protocol during bolus administration of intravenous contrast. Multiplanar CT image reconstructions and MIPs were obtained to evaluate the vascular anatomy. RADIATION DOSE REDUCTION: This exam was performed according to the departmental  dose-optimization program which includes automated exposure control, adjustment of the mA and/or kV according to patient size and/or use of iterative reconstruction technique. CONTRAST:  142m OMNIPAQUE IOHEXOL 350 MG/ML SOLN COMPARISON:  CT abdomen and pelvis 08/06/2022 FINDINGS: VASCULAR Aorta: Diffuse circumferential calcifications involving the abdominal aorta without aneurysm or dissection. No significant aortic stenosis. Celiac: Celiac artery is patent with diffuse atherosclerotic calcifications. At least mild narrowing at the origin of the celiac trunk. No evidence for aneurysm or dissection involving the branch vessels. SMA: SMA is patent with at least moderate stenosis at the origin due to large amount of calcified plaque. Renals: Bilateral renal arteries are patent with diffuse atherosclerotic plaque. High-grade stenosis involving the right renal artery origin. At least mild to moderate stenosis in the proximal left renal artery. No aneurysm or dissection involving the renal arteries. IMA: Patent RIGHT Lower Extremity Inflow: Circumferential calcifications in the right common iliac artery without significant stenosis. High-grade stenosis or occlusion at the origin of the right internal iliac artery. Diffuse calcified plaque throughout the right external iliac artery. Stenosis in the right external artery is likely hemodynamically significant with scattered areas of narrowing from calcified plaque. Outflow: Postsurgical changes in the right groin and the right common femoral artery is patent. Right profunda femoral arteries are calcified but patent. Native SFA is occluded. Right femoropopliteal bypass graft is tortuous but patent. Graft ties into the distal popliteal artery near the knee. There appears to be some intraluminal stenosis in the bypass graft. Limited evaluation of the distal graft  due to motion artifact. Suspect that the distal graft is patent. Popliteal artery just distal to the graft is very  limited due to motion artifact. Runoff: Limited evaluation of the runoff vessels due to diffuse calcifications. However, there appears to be flow in the posterior tibial artery and peroneal artery. Evidence for segmental disease throughout the anterior tibial artery and limited evaluation. LEFT Lower Extremity Inflow: Circumferential calcifications in left common iliac artery without significant stenosis. Probable high-grade stenosis at the origin left internal iliac artery. Narrowing in the mid and distal left external iliac artery from diffuse atherosclerotic disease. Outflow: Left common femoral artery is heavily calcified but patent. Origin of the left profunda femoral artery is heavily calcified. There is flow in the left profunda femoral arteries. Native left SFA is occluded. Left femoropopliteal bypass graft is occluded. Runoff: Limited evaluation due to calcified vessels. There appears to be flow in the left peroneal artery. Occlusive disease in the anterior tibial artery. There appears to be occlusive or segmental disease in the posterior tibial artery. Veins: No obvious venous abnormality within the limitations of this arterial phase study. Review of the MIP images confirms the above findings. NON-VASCULAR Lower chest: Bandlike atelectasis or scarring in the right lower lobe. Otherwise, the visualized lung bases are clear. Hepatobiliary: Evidence for cholelithiasis. Mild distention of the gallbladder is unchanged. Normal appearance of liver. Pancreas: Unremarkable. No pancreatic ductal dilatation or surrounding inflammatory changes. Spleen: Normal in size without focal abnormality. Adrenals/Urinary Tract: Adrenal glands are within normal limits. Atrophy in both kidneys compatible with history of end-stage renal disease. Evidence for bilateral renal cysts that to do not require dedicated follow-up. Urinary bladder is decompressed. Small amount of contrast within the urinary bladder. Stomach/Bowel:  High-density material throughout the small and large bowel without dilatation or obstruction. Normal appearance of the stomach. Again noted is material just posterior to the rectum and near the tip of the coccyx on image 135/6. This material measures roughly 2.0 x 2.2 cm and minimally changed from the exam on 08/06/2022. Concern for increased densities in the posterior subcutaneous tissues along the left posterior aspect of the coccyx on image 124/6. Developing subcutaneous fistula tract cannot be excluded. Lymphatic: No significant lymph node enlargement in the abdomen or pelvis. Reproductive: Status post hysterectomy. No evidence for a left adnexa mass. Again noted is complex structure in the region of the right adnexa containing a small focus of gas and small calcifications. There is a percutaneous drain along the anterior aspect of this complex collection. Collection measures approximately 4.6 x 3.9 cm on image 115/6 and roughly measured 4.1 x 3.5 cm on the exam from 08/06/2022. Other: No free fluid.  Negative for free air. Musculoskeletal: Bilateral transmetatarsal amputations. No acute bone abnormality. IMPRESSION: VASCULAR 1. Severe atherosclerotic disease throughout the abdomen, pelvis and bilateral lower extremities. Aortic Atherosclerosis (ICD10-I70.0). 2. Bilateral inflow disease. Stenosis involving bilateral external iliac arteries. 3. 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 4. Right femoropopliteal bypass graft is patent but limited evaluation due to motion artifact. 5. Very limited evaluation of the bilateral runoff vessels due to severe vascular calcifications. Likely single-vessel runoff on the left from the peroneal artery. At least 2 vessel runoff in the right lower extremity as described. 6. 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 7. Bilateral renal  artery stenosis. NON-VASCULAR 1. Complex collection in the right hemipelvis has slightly  enlarged in size since 08/06/2022. The anterior percutaneous drain is still in place and located along the anterior aspect of the collection. Slightly limited evaluation of this structure due to the phase of contrast. 2. Concern for fluid or material just posterior to the rectum and just inferior to the coccyx. There may also be material or fluid tracking in the posterior pelvic subcutaneous tissues near the coccyx. Recommend attention to this area on follow up imaging. 3. Cholelithiasis. Electronically Signed   By: Markus Daft M.D.   On: 08/10/2022 17:27   NM Hepatobiliary Liver Func  Result Date: 08/10/2022 CLINICAL DATA:  Concern for acute cholecystitis. EXAM: NUCLEAR MEDICINE HEPATOBILIARY IMAGING TECHNIQUE: Sequential images of the abdomen were obtained out to 60 minutes following intravenous administration of radiopharmaceutical. RADIOPHARMACEUTICALS:  5.2 mCi Tc-76m Choletec IV COMPARISON:  CT August 06, 2022. FINDINGS: Prompt uptake and biliary excretion of activity by the liver is seen. Gallbladder activity is visualized, consistent with patency of cystic duct. Biliary activity passes into small bowel, consistent with patent common bile duct. IMPRESSION: Patent cystic and common bile ducts without scintigraphic evidence of acute cholecystitis. Electronically Signed   By: JDahlia BailiffM.D.   On: 08/10/2022 13:39   [place]KG SITE RITE  Result Date: 08/10/2022 If Site Rite image not attached, placement could not be confirmed due to current cardiac rhythm.   Anti-infectives: Anti-infectives (From admission, onward)    Start     Dose/Rate Route Frequency Ordered Stop   08/10/22 1400  cefTRIAXone (ROCEPHIN) 2 g in sodium chloride 0.9 % 100 mL IVPB        2 g 200 mL/hr over 30 Minutes Intravenous Every 24 hours 08/10/22 1025     08/10/22 1030  metroNIDAZOLE (FLAGYL) tablet 500 mg        500 mg Oral Every 12  hours 08/10/22 1025     08/08/22 1545  piperacillin-tazobactam (ZOSYN) IVPB 2.25 g  Status:  Discontinued        2.25 g 100 mL/hr over 30 Minutes Intravenous Every 8 hours 08/08/22 1455 08/10/22 1025   08/08/22 1415  ceFAZolin (ANCEF) IVPB 1 g/50 mL premix  Status:  Discontinued        1 g 100 mL/hr over 30 Minutes Intravenous Daily 08/08/22 1327 08/08/22 1454   08/21/2022 2245  piperacillin-tazobactam (ZOSYN) IVPB 2.25 g  Status:  Discontinued        2.25 g 100 mL/hr over 30 Minutes Intravenous Every 8 hours 08/01/2022 2215 08/08/22 1327   08/15/2022 1900  piperacillin-tazobactam (ZOSYN) IVPB 3.375 g        3.375 g 100 mL/hr over 30 Minutes Intravenous  Once 08/21/2022 1853 08/26/2022 2049       Assessment/Plan: Suspected perforated appendicitis with pelvic abscess S/p IR drain placement WBC increased to 28k- HIDA negative, CTA yesterday performed by vascular shows stable to slightly enlarged RLQ fluid collection - will ask IR to evaluate drain - may need exchange or upsize. I examined the patients perianal region and perineum and there are no signs of infection as noted above. Barrier cream ordered for the perianal region.  FEN: Renal diet with fluid restriction 12057m ID: [id number] rocephin/flagyl per ID  VTE: SCD's Foley: none   ESRD CHF HTN Neuropathy HTN OSA    ElJill AlexandersA-C 08/11/2022

## 2022-08-11 NOTE — Progress Notes (Signed)
ANTICOAGULATION CONSULT NOTE  Pharmacy Consult for Heparin Indication:  Limb ischemia   Allergies  Allergen Reactions   Adhesive  [Tape] Hives   Lactose Intolerance (Gi) Diarrhea   Lisinopril Cough   Prednisone Swelling and Other (See Comments)    Excessive fluid buildup    Patient Measurements: Height: '5\' 5"'$  (165.1 cm) Weight: 56.2 kg (123 lb 14.4 oz) IBW/kg (Calculated) : 57  Vital Signs: Temp: 98 F (36.7 C) (01/16 0422) Temp Source: Oral (01/16 0422) BP: 176/62 (01/16 0422) Pulse Rate: 83 (01/16 0422)  Labs: Recent Labs    08/09/22 0502 08/10/22 0446 08/10/22 2027 08/11/22 0612  HGB 11.6* 11.6*  --  12.9  HCT 36.8 34.3*  --  39.7  PLT 320 359  --  411*  HEPARINUNFRC  --   --  0.62 0.61  CREATININE 5.01* 6.06*  --   --      Estimated Creatinine Clearance: 7.6 mL/min (A) (by C-G formula based on SCr of 6.06 mg/dL (H)).   Assessment: 72 year old female to begin heparin for limb ischemia.  Has a drain in place, also with ESRD.  Heparin level therapeutic   Goal of Therapy:  Heparin level 0.3-0.7 units/ml Monitor platelets by anticoagulation protocol: Yes   Plan:  Heparin at 750 units / hr Follow up AM labs  Thank you Anette Guarneri, PharmD 08/11/2022, 8:06 AM

## 2022-08-11 NOTE — Progress Notes (Signed)
Initial Nutrition Assessment  DOCUMENTATION CODES:   Non-severe (moderate) malnutrition in context of chronic illness  INTERVENTION:   Liberalize pt diet to regular due to ongoing poor PO intake  Continue 1.2 L fluid restriction Encourage good PO intake Meal ordering with assist Renal Multivitamin w/ minerals daily Nepro Shake po BID, each supplement provides 425 kcal and 19 grams protein  NUTRITION DIAGNOSIS:   Moderate Malnutrition related to chronic illness as evidenced by severe muscle depletion, moderate fat depletion.  GOAL:   Patient will meet greater than or equal to 90% of their needs  MONITOR:   PO intake, Supplement acceptance, Labs, Weight trends, I & O's  REASON FOR ASSESSMENT:   Rounds    ASSESSMENT:   72 y.o. female presented to the ED with RLQ pain. PMH includes ESRD on HD (MWF), COPD, T2DM, GERD, HLD, and diverticulosis. Pt admitted with intra-abdominal abscess, concern for perforated appendicitis.   1/07 - Admitted 1/08 - drain placed  Pt laying in bed, family at bedside. Reports that she had some grits for breakfast. Discussed importance of proper nutrition to assist in healing and preventing loss of lean muscle mass. Discussed changing diet to allow pt to have foods that she enjoys. Discussed oral nutrition supplements with pt and family; both agreeable. Pt unsure if she has recently lost any weight. She is currently below her EDW.  Discussed with team. MD shares that pt is not eating much.   Medications reviewed and include: Risaquad, Hectorol, Ferric citrate, NovoLog SSI, Protonix, IV antibiotics  Labs reviewed: Sodium 128, Potassium 3.2, Phosphorus 4.1, Hgb A1c 6.2%, 24 hr CBGs 145-193  HD on 1/15  EDW: 57.4 kg Net UF: 700 mL   NUTRITION - FOCUSED PHYSICAL EXAM:  Flowsheet Row Most Recent Value  Orbital Region Moderate depletion  Upper Arm Region Moderate depletion  Thoracic and Lumbar Region Moderate depletion  Buccal Region Moderate  depletion  Temple Region Mild depletion  Clavicle Bone Region Mild depletion  Clavicle and Acromion Bone Region Moderate depletion  Scapular Bone Region Moderate depletion  Dorsal Hand Moderate depletion  Patellar Region Severe depletion  Anterior Thigh Region Severe depletion  Posterior Calf Region Severe depletion  Edema (RD Assessment) None  Hair Unable to assess  Eyes Reviewed  Mouth Reviewed  Skin Reviewed  Nails Unable to assess  [Nail Polish]   Diet Order:   Diet Order             Diet regular Room service appropriate? Yes with Assist; Fluid consistency: Thin; Fluid restriction: 1200 mL Fluid  Diet effective now                   EDUCATION NEEDS:   Not appropriate for education at this time  Skin:  Skin Assessment: Reviewed RN Assessment  Last BM:  1/13  Height:   Ht Readings from Last 1 Encounters:  08/04/22 '5\' 5"'$  (1.651 m)    Weight:   Wt Readings from Last 1 Encounters:  08/10/22 56.2 kg   Ideal Body Weight:  56.8 kg  BMI:  Body mass index is 20.62 kg/m.  Estimated Nutritional Needs:  Kcal:  1700-1900 Protein:  85-100 grams Fluid:  UOP + 1 L   [name], LDN Clinical Dietitian See [facility] for contact information.

## 2022-08-11 NOTE — Progress Notes (Signed)
PROGRESS NOTE  Destiny Day ERX:[id number] DOB: 30-Aug-1950 DOA: 08/08/2022 PCP: Cipriano Mile, NP   LOS: 9 days   Brief Narrative / Interim history:  72 year old female with history of ESRD on MWF HD, asthma/COPD, diastolic CHF, HTN, HLD, DM2, comes into the hospital with few days of right lower quadrant abdominal pain.  Imaging in the ED concerning for perforated appendicitis with a periappendiceal abscess with probably secondary involvement of the previously noted adnexal mass or a tubo-ovarian abscess.  She was placed on antibiotics, general surgery was consulted and she was admitted to the hospital.  She has been seen by IR, where she had drain placed in the abscess, further workup was pursued given persistent leukocytosis, which was significant for lower extremity ischemia, for which she has been started on heparin drip by vascular surgery.  Subjective / 24h Interval events:  Significant events overnight, she still complains of lower extremity pain, her right lower quadrant pain with no significant change.  Assesement and Plan:  Intra-abdominal abscess,  perforated appendicitis  -Antibiotics management per ID, initially on Zosyn, currently on Rocephin and Flagyl -  General surgery consulted, IR consulted and she is status post right lower quadrant abscess drain placement.   -He remains with persistent leukocytosis, repeat CT scan showing increased fluid collection size, IR to reevaluate today and to inject drain, as well evidence of fluid collection coccyx and rectum, discussed with IR to evaluate of possible to insert drain in that area. -HIDA scan with no evidence of acute cholecystitis (was obtained by general surgery due to persistent leukocytosis)  Leukocytosis -Can be related to multiple etiologies, possibly due to above. -Lower extremity ischemia can be contributing. - please see discussion below-she is with diarrhea, if other workup is inconclusive then she will need to be  checked for C. Difficile  PAD -Vascular surgery input greatly appreciated, CTA worked on lower extremity has been obtained, demonstrating severe atherosclerotic disease. -She is high risk for intervention currently especially in the setting of appendiceal abscess, encephalopathy, for now continue with heparin drip, conservative management, and possible need for angiogram later in the week if she improves.  Essential hypertension- continue home medications.  Hospital delirium -Much improved today, continue with delirium precaution  Complex right ovarian mass -Discussed with husband, he understand important patient to follow-up with GYN as an outpatient  Left upper pole renal lesion -Concern of malignancy, have discussed these findings with husband as well, he understands importance of follow-up with urology as an outpatient  Neuropathic pain - she is complaining of bilateral extremity neuropathic pain so I will resume her back on gabapentin  ESRD on HD-nephrology consulted,HD per renal   Hyperlipidemia-resume home statin  Chronic diastolic CHF -appears euvolemic, volume management per HD  Anxiety-continue home BuSpar as needed  Asthma/COPD-stable, no wheezing, no exacerbation  Hypokalemia-replete and continue to monitor, potassium normalized this morning  Hyponatremia-fluid status managed with HD   DM2-continue sliding scale.  CBG (last 3)  Recent Labs    08/10/22 2013 08/11/22 0756 08/11/22 1223  GLUCAP 193* 184* 188*    Scheduled Meds:  acidophilus  1 capsule Oral Daily   amLODipine  5 mg Oral QHS   atorvastatin  40 mg Oral QHS   carvedilol  12.5 mg Oral Q M,W,F-HD   carvedilol  12.5 mg Oral 2 times per day on Tue Thu Sat   Chlorhexidine Gluconate Cloth  6 each Topical Q0600   doxercalciferol  7 mcg Intravenous Q M,W,F-HD   feeding supplement (NEPRO  CARB STEADY)  237 mL Oral BID BM   ferric citrate  420 mg Oral TID WC   fluticasone furoate-vilanterol  1 puff  Inhalation Daily   Gerhardt's butt cream   Topical TID   insulin aspart  0-6 Units Subcutaneous TID WC   isosorbide-hydrALAZINE  1 tablet Oral 2 times per day on Sun Tue Thu Sat   isosorbide-hydrALAZINE  1 tablet Oral Q M,W,F-HD   metroNIDAZOLE  500 mg Oral Q12H   multivitamin  1 tablet Oral QHS   pantoprazole  40 mg Oral BID   sodium chloride flush  5 mL Intracatheter Q8H   ursodiol  300 mg Oral BID   Continuous Infusions:  cefTRIAXone (ROCEPHIN)  IV Stopped (08/10/22 2346)   heparin 750 Units/hr (08/10/22 2127)   PRN Meds:.albuterol, busPIRone, fluticasone, hydrALAZINE, ondansetron (ZOFRAN) IV, ondansetron, oxyCODONE, prochlorperazine, traZODone  Current Outpatient Medications  Medication Instructions   albuterol (VENTOLIN HFA) 108 (90 Base) MCG/ACT inhaler 2 puffs, Inhalation, Every 4 hours PRN   amLODipine (NORVASC) 10 mg, Oral, Daily   aspirin EC 81 mg, Oral, Daily   atorvastatin (LIPITOR) 40 mg, Oral, Daily at bedtime   b complex-vitamin c-folic acid (NEPHRO-VITE) 0.8 MG TABS tablet 1 tablet, Oral, Daily at bedtime   budesonide-formoterol (SYMBICORT) 160-4.5 MCG/ACT inhaler 2 puffs, Inhalation, 2 times daily   busPIRone (BUSPAR) 7.5 mg, Oral, Daily PRN   carvedilol (COREG) 12.5 mg, Oral, See admin instructions, Take 1 tablet (12.5 mg) by mouth once daily in the evening on Mondays, Wednesdays, & Fridays. (Dialysis)<BR>Take 1 tablet (12.5 mg) by mouth twice daily on Sundays, Tuesdays, Thursdays, & Saturdays. (Non Dialysis)   cinacalcet (SENSIPAR) 60 mg, Oral, See admin instructions, Take 1 tablet (60 mg) by mouth in the evening on Mondays, Wednesdays, & Fridays (dialysis)<BR>Take 1 tablet (60 mg) by mouth twice daily on Sundays, Tuesdays, Thursdays, Saturdays. (non dialysis)   diphenhydrAMINE HCl (BENADRYL ALLERGY PO) 1 capsule, Oral, Every M-W-F (Hemodialysis), If needed to help with allergies   EPINEPHrine (AUVI-Q) 0.3 mg, Intramuscular, As needed   ferric citrate (AURYXIA)  210-420 mg, Oral, See admin instructions, Take 2 tablets (420 mg) by mouth with each meal & take 1 tablet (210 mg) by mouth with snacks.    fluticasone (FLONASE) 50 MCG/ACT nasal spray 2 sprays, Each Nare, Daily PRN   fluticasone-salmeterol (ADVAIR HFA) 115-21 MCG/ACT inhaler Inhale two puffs twice daily   gabapentin (NEURONTIN) 100 mg, Oral, Daily at bedtime, When necessary for neuropathy pain   glipiZIDE (GLUCOTROL) 5 mg, Oral, Daily at bedtime   isosorbide-hydrALAZINE (BIDIL) 20-37.5 MG tablet 1 tablet, Oral, See admin instructions, hs on Monday,Wednesday,Friday (dialysis days)<BR>Tid on Tuesday,Thursday,Saturday,Sunday(non-dialysis days)   latanoprost (XALATAN) 0.005 % ophthalmic solution 1 drop, Left Eye, Daily at bedtime   levocetirizine (XYZAL) 5 mg, Oral, Every evening   lidocaine-prilocaine (EMLA) cream 1 Application, Topical, As needed   loperamide (IMODIUM) 4 mg, Oral, 3 times daily PRN   Methoxy PEG-Epoetin Beta (MIRCERA IJ) Mircera   montelukast (SINGULAIR) 10 MG tablet TAKE 1 TABLET BY MOUTH EVERYDAY AT BEDTIME   ofloxacin (OCUFLOX) 0.3 % ophthalmic solution 1 drop, 4 times daily   omeprazole (PRILOSEC) 20 mg, Oral, Daily PRN   ondansetron (ZOFRAN) 4 mg, Oral, Every 6 hours PRN   prednisoLONE acetate (PRED FORTE) 1 % ophthalmic suspension 1 drop, 4 times daily   promethazine-dextromethorphan (PROMETHAZINE-DM) 6.25-15 MG/5ML syrup 5 mLs, Oral, 4 times daily PRN   Spacer/Aero-Holding Chambers (AEROCHAMBER PLUS) inhaler Use with inhaler   traZODone (DESYREL) 25-50  mg, Oral, At bedtime PRN   ursodiol (ACTIGALL) 250 mg, Oral, 2 times daily    Diet Orders (From admission, onward)     Start     Ordered   08/11/22 1317  Diet regular Room service appropriate? Yes with Assist; Fluid consistency: Thin; Fluid restriction: 1200 mL Fluid  Diet effective now       Question Answer Comment  Room service appropriate? Yes with Assist   Fluid consistency: Thin   Fluid restriction: 1200 mL  Fluid      08/11/22 1316            DVT prophylaxis: SCDs Start: 08/14/2022 2213   Lab Results  Component Value Date   PLT 411 (H) 08/11/2022      Code Status: Full Code  Family Communication: Discussed with daughter at bedside  Status is: Inpatient  Remains inpatient appropriate because: severity of illness  Level of care: Progressive  Consultants:  General surgery IR Nephrology  ID Vascular  Objective: Vitals:   08/11/22 0106 08/11/22 0422 08/11/22 0800 08/11/22 1226  BP: (!) 141/46 (!) 176/62 (!) 181/48 (!) 157/57  Pulse: 78 83 79 75  Resp: '18 15 15   '$ Temp: 98.5 F (36.9 C) 98 F (36.7 C)    TempSrc: Oral Oral    SpO2: 97% 96% 95%   Weight:      Height:        Intake/Output Summary (Last 24 hours) at 08/11/2022 1357 Last data filed at 08/11/2022 0900 Gross per 24 hour  Intake 120 ml  Output 10 ml  Net 110 ml   Wt Readings from Last 3 Encounters:  08/10/22 56.2 kg  05/29/22 63.5 kg  02/19/22 61.7 kg    Examination:  Awake, alert, pleasant, but she is confused, in no apparent distress Good air entry bilaterally, no wheezing or rhonchi regular rate and rhythm, no rubs or gallops Abdomen soft, bowel sounds present, right lower quadrant drain Status post TMA bilaterally, no signs of gangrene or ischemia, no open wounds, left lower extremity cool to palpation     Data Reviewed: I have independently reviewed following labs and imaging studies   CBC Recent Labs  Lab 08/07/22 0627 08/08/22 0154 08/09/22 0502 08/10/22 0446 08/11/22 0612  WBC 20.9* 24.8* 27.1* 28.8* 28.1*  HGB 11.5* 11.5* 11.6* 11.6* 12.9  HCT 36.6 34.2* 36.8 34.3* 39.7  PLT 420* 365 320 359 411*  MCV 99.7 95.8 101.9* 95.0 96.8  MCH 31.3 32.2 32.1 32.1 31.5  MCHC 31.4 33.6 31.5 33.8 32.5  RDW 13.3 13.2 13.2 13.0 13.3    Recent Labs  Lab 08/05/22 0403 08/07/22 0627 08/09/22 0502 08/10/22 0446 08/10/22 0724  NA 129* 131* 132* 128*  --   K 3.3* 3.1* 3.0* 3.2*  --    CL 88* 88* 91* 90*  --   CO2 '25 24 22 '$ 21*  --   GLUCOSE 152* 180* 159* 190*  --   BUN 22 36* 34* 41*  --   CREATININE 5.14* 5.33* 5.01* 6.06*  --   CALCIUM 9.4 9.5 10.0 10.0  --   AST 15  --   --   --   --   ALT 10  --   --   --   --   ALKPHOS 73  --   --   --   --   BILITOT 0.5  --   --   --   --   ALBUMIN 2.2* 2.3* 2.3*  --   --  MG 2.0  --   --   --   --   LATICACIDVEN  --   --   --   --  1.2    ------------------------------------------------------------------------------------------------------------------ No results for input(s): "CHOL", "HDL", "LDLCALC", "TRIG", "CHOLHDL", "LDLDIRECT" in the last 72 hours.  Lab Results  Component Value Date   HGBA1C 6.2 (H) 08/03/2022   ------------------------------------------------------------------------------------------------------------------ No results for input(s): "TSH", "T4TOTAL", "T3FREE", "THYROIDAB" in the last 72 hours.  Invalid input(s): "FREET3"   Cardiac Enzymes No results for input(s): "CKMB", "TROPONINI", "MYOGLOBIN" in the last 168 hours.  Invalid input(s): "CK" ------------------------------------------------------------------------------------------------------------------    Component Value Date/Time   BNP 1,676.4 (H) 08/03/2022 0008    CBG: Recent Labs  Lab 08/10/22 1141 08/10/22 1512 08/10/22 2013 08/11/22 0756 08/11/22 1223  GLUCAP 145* 154* 193* 184* 188*    Recent Results (from the past 240 hour(s))  Aerobic/Anaerobic Culture w Gram Stain (surgical/deep wound)     Status: None   Collection Time: 08/03/22  1:26 PM   Specimen: Wound; Abscess  Result Value Ref Range Status   Specimen Description WOUND  Final   Special Requests  ABCESS ABDOMEN  Final   Gram Stain   Final    ABUNDANT WBC PRESENT, PREDOMINANTLY PMN ABUNDANT GRAM NEGATIVE RODS FEW GRAM POSITIVE COCCI IN PAIRS IN CHAINS FEW GRAM POSITIVE RODS    Culture   Final    ABUNDANT ESCHERICHIA COLI MODERATE KLEBSIELLA  PNEUMONIAE ABUNDANT BACTEROIDES THETAIOTAOMICRON BETA LACTAMASE POSITIVE Performed at [facility], [place] [address], [place], [place] [address]    Report Status 08/08/2022 FINAL  Final   Organism ID, Bacteria ESCHERICHIA COLI  Final   Organism ID, Bacteria KLEBSIELLA PNEUMONIAE  Final      Susceptibility   Escherichia coli - MIC*    AMPICILLIN >=32 RESISTANT Resistant     CEFAZOLIN <=4 SENSITIVE Sensitive     CEFEPIME <=0.12 SENSITIVE Sensitive     CEFTAZIDIME <=1 SENSITIVE Sensitive     CEFTRIAXONE <=0.25 SENSITIVE Sensitive     CIPROFLOXACIN <=0.25 SENSITIVE Sensitive     GENTAMICIN <=1 SENSITIVE Sensitive     IMIPENEM <=0.25 SENSITIVE Sensitive     TRIMETH/SULFA <=20 SENSITIVE Sensitive     AMPICILLIN/SULBACTAM >=32 RESISTANT Resistant     PIP/TAZO <=4 SENSITIVE Sensitive     * ABUNDANT ESCHERICHIA COLI   Klebsiella pneumoniae - MIC*    AMPICILLIN RESISTANT Resistant     CEFAZOLIN <=4 SENSITIVE Sensitive     CEFEPIME <=0.12 SENSITIVE Sensitive     CEFTAZIDIME <=1 SENSITIVE Sensitive     CEFTRIAXONE <=0.25 SENSITIVE Sensitive     CIPROFLOXACIN <=0.25 SENSITIVE Sensitive     GENTAMICIN <=1 SENSITIVE Sensitive     IMIPENEM <=0.25 SENSITIVE Sensitive     TRIMETH/SULFA <=20 SENSITIVE Sensitive     AMPICILLIN/SULBACTAM 4 SENSITIVE Sensitive     PIP/TAZO <=4 SENSITIVE Sensitive     * MODERATE KLEBSIELLA PNEUMONIAE     Radiology Studies: CT ANGIO AO+BIFEM W & OR WO CONTRAST  Result Date: 08/10/2022 CLINICAL DATA:  72 year old with rest pain. EXAM: CT ANGIOGRAPHY OF ABDOMINAL AORTA WITH ILIOFEMORAL RUNOFF TECHNIQUE: Multidetector CT imaging of the abdomen, pelvis and lower extremities was performed using the standard protocol during bolus administration of intravenous contrast. Multiplanar CT image reconstructions and MIPs were obtained to evaluate the vascular anatomy. RADIATION DOSE REDUCTION: This exam was performed according to the departmental dose-optimization program  which includes automated exposure control, adjustment of the mA and/or kV according to patient size and/or  use of iterative reconstruction technique. CONTRAST:  136m OMNIPAQUE IOHEXOL 350 MG/ML SOLN COMPARISON:  CT abdomen and pelvis 08/06/2022 FINDINGS: VASCULAR Aorta: Diffuse circumferential calcifications involving the abdominal aorta without aneurysm or dissection. No significant aortic stenosis. Celiac: Celiac artery is patent with diffuse atherosclerotic calcifications. At least mild narrowing at the origin of the celiac trunk. No evidence for aneurysm or dissection involving the branch vessels. SMA: SMA is patent with at least moderate stenosis at the origin due to large amount of calcified plaque. Renals: Bilateral renal arteries are patent with diffuse atherosclerotic plaque. High-grade stenosis involving the right renal artery origin. At least mild to moderate stenosis in the proximal left renal artery. No aneurysm or dissection involving the renal arteries. IMA: Patent RIGHT Lower Extremity Inflow: Circumferential calcifications in the right common iliac artery without significant stenosis. High-grade stenosis or occlusion at the origin of the right internal iliac artery. Diffuse calcified plaque throughout the right external iliac artery. Stenosis in the right external artery is likely hemodynamically significant with scattered areas of narrowing from calcified plaque. Outflow: Postsurgical changes in the right groin and the right common femoral artery is patent. Right profunda femoral arteries are calcified but patent. Native SFA is occluded. Right femoropopliteal bypass graft is tortuous but patent. Graft ties into the distal popliteal artery near the knee. There appears to be some intraluminal stenosis in the bypass graft. Limited evaluation of the distal graft due to motion artifact. Suspect that the distal graft is patent. Popliteal artery just distal to the graft is very limited due to motion  artifact. Runoff: Limited evaluation of the runoff vessels due to diffuse calcifications. However, there appears to be flow in the posterior tibial artery and peroneal artery. Evidence for segmental disease throughout the anterior tibial artery and limited evaluation. LEFT Lower Extremity Inflow: Circumferential calcifications in left common iliac artery without significant stenosis. Probable high-grade stenosis at the origin left internal iliac artery. Narrowing in the mid and distal left external iliac artery from diffuse atherosclerotic disease. Outflow: Left common femoral artery is heavily calcified but patent. Origin of the left profunda femoral artery is heavily calcified. There is flow in the left profunda femoral arteries. Native left SFA is occluded. Left femoropopliteal bypass graft is occluded. Runoff: Limited evaluation due to calcified vessels. There appears to be flow in the left peroneal artery. Occlusive disease in the anterior tibial artery. There appears to be occlusive or segmental disease in the posterior tibial artery. Veins: No obvious venous abnormality within the limitations of this arterial phase study. Review of the MIP images confirms the above findings. NON-VASCULAR Lower chest: Bandlike atelectasis or scarring in the right lower lobe. Otherwise, the visualized lung bases are clear. Hepatobiliary: Evidence for cholelithiasis. Mild distention of the gallbladder is unchanged. Normal appearance of liver. Pancreas: Unremarkable. No pancreatic ductal dilatation or surrounding inflammatory changes. Spleen: Normal in size without focal abnormality. Adrenals/Urinary Tract: Adrenal glands are within normal limits. Atrophy in both kidneys compatible with history of end-stage renal disease. Evidence for bilateral renal cysts that to do not require dedicated follow-up. Urinary bladder is decompressed. Small amount of contrast within the urinary bladder. Stomach/Bowel: High-density material  throughout the small and large bowel without dilatation or obstruction. Normal appearance of the stomach. Again noted is material just posterior to the rectum and near the tip of the coccyx on image 135/6. This material measures roughly 2.0 x 2.2 cm and minimally changed from the exam on 08/06/2022. Concern for increased densities in the posterior subcutaneous  tissues along the left posterior aspect of the coccyx on image 124/6. Developing subcutaneous fistula tract cannot be excluded. Lymphatic: No significant lymph node enlargement in the abdomen or pelvis. Reproductive: Status post hysterectomy. No evidence for a left adnexa mass. Again noted is complex structure in the region of the right adnexa containing a small focus of gas and small calcifications. There is a percutaneous drain along the anterior aspect of this complex collection. Collection measures approximately 4.6 x 3.9 cm on image 115/6 and roughly measured 4.1 x 3.5 cm on the exam from 08/06/2022. Other: No free fluid.  Negative for free air. Musculoskeletal: Bilateral transmetatarsal amputations. No acute bone abnormality. IMPRESSION: VASCULAR 1. Severe atherosclerotic disease throughout the abdomen, pelvis and bilateral lower extremities. Aortic Atherosclerosis (ICD10-I70.0). 2. Bilateral inflow disease. Stenosis involving bilateral external iliac arteries. 3. 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 4. Right femoropopliteal bypass graft is patent but limited evaluation due to motion artifact. 5. Very limited evaluation of the bilateral runoff vessels due to severe vascular calcifications. Likely single-vessel runoff on the left from the peroneal artery. At least 2 vessel runoff in the right lower extremity as described. 6. 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 7. Bilateral renal artery stenosis.  NON-VASCULAR 1. Complex collection in the right hemipelvis has slightly enlarged in size since 08/06/2022. The anterior percutaneous drain is still in place and located along the anterior aspect of the collection. Slightly limited evaluation of this structure due to the phase of contrast. 2. Concern for fluid or material just posterior to the rectum and just inferior to the coccyx. There may also be material or fluid tracking in the posterior pelvic subcutaneous tissues near the coccyx. Recommend attention to this area on follow up imaging. 3. Cholelithiasis. Electronically Signed   By: Markus Daft M.D.   On: 08/10/2022 17:27     Colter Magowan MD [facility]  Between 7 am - 7 pm I am available, please contact me via [place] (for emergencies) or Securechat (non urgent messages)  Between 7 pm - 7 am I am not available, please contact night coverage MD/APP via Amion

## 2022-08-12 ENCOUNTER — Inpatient Hospital Stay (HOSPITAL_COMMUNITY): Payer: Medicare Other

## 2022-08-12 DIAGNOSIS — I10 Essential (primary) hypertension: Secondary | ICD-10-CM | POA: Diagnosis not present

## 2022-08-12 DIAGNOSIS — K651 Peritoneal abscess: Secondary | ICD-10-CM | POA: Diagnosis not present

## 2022-08-12 DIAGNOSIS — K659 Peritonitis, unspecified: Secondary | ICD-10-CM | POA: Diagnosis not present

## 2022-08-12 DIAGNOSIS — I739 Peripheral vascular disease, unspecified: Secondary | ICD-10-CM | POA: Diagnosis not present

## 2022-08-12 DIAGNOSIS — I70222 Atherosclerosis of native arteries of extremities with rest pain, left leg: Secondary | ICD-10-CM

## 2022-08-12 DIAGNOSIS — N186 End stage renal disease: Secondary | ICD-10-CM | POA: Diagnosis not present

## 2022-08-12 HISTORY — PX: IR SINUS/FIST TUBE CHK-NON GI: IMG673

## 2022-08-12 LAB — CBC
HCT: 32.5 % — ABNORMAL LOW (ref 36.0–46.0)
HCT: 30 % — ABNORMAL LOW (ref 36.0–46.0)
Hemoglobin: 11 g/dL — ABNORMAL LOW (ref 12.0–15.0)
Hemoglobin: 10.4 g/dL — ABNORMAL LOW (ref 12.0–15.0)
MCH: 31.7 pg (ref 26.0–34.0)
MCH: 32.2 pg (ref 26.0–34.0)
MCHC: 33.8 g/dL (ref 30.0–36.0)
MCHC: 34.7 g/dL (ref 30.0–36.0)
MCV: 93.7 fL (ref 80.0–100.0)
MCV: 92.9 fL (ref 80.0–100.0)
Platelets: 306 10*3/uL (ref 150–400)
Platelets: 334 10*3/uL (ref 150–400)
RBC: 3.47 MIL/uL — ABNORMAL LOW (ref 3.87–5.11)
RBC: 3.23 MIL/uL — ABNORMAL LOW (ref 3.87–5.11)
RDW: 13.3 % (ref 11.5–15.5)
RDW: 13.2 % (ref 11.5–15.5)
WBC: 24.5 10*3/uL — ABNORMAL HIGH (ref 4.0–10.5)
WBC: 25.5 10*3/uL — ABNORMAL HIGH (ref 4.0–10.5)
nRBC: 0.1 % (ref 0.0–0.2)
nRBC: 0.1 % (ref 0.0–0.2)

## 2022-08-12 LAB — GLUCOSE, CAPILLARY
Glucose-Capillary: 154 mg/dL — ABNORMAL HIGH (ref 70–99)
Glucose-Capillary: 233 mg/dL — ABNORMAL HIGH (ref 70–99)
Glucose-Capillary: 156 mg/dL — ABNORMAL HIGH (ref 70–99)

## 2022-08-12 LAB — RENAL FUNCTION PANEL
Albumin: 2 g/dL — ABNORMAL LOW (ref 3.5–5.0)
Anion gap: 17 — ABNORMAL HIGH (ref 5–15)
BUN: 37 mg/dL — ABNORMAL HIGH (ref 8–23)
CO2: 21 mmol/L — ABNORMAL LOW (ref 22–32)
Calcium: 9.6 mg/dL (ref 8.9–10.3)
Chloride: 84 mmol/L — ABNORMAL LOW (ref 98–111)
Creatinine, Ser: 6.5 mg/dL — ABNORMAL HIGH (ref 0.44–1.00)
GFR, Estimated: 6 mL/min — ABNORMAL LOW (ref >60)
Glucose, Bld: 251 mg/dL — ABNORMAL HIGH (ref 70–99)
Phosphorus: 4.4 mg/dL (ref 2.5–4.6)
Potassium: 2.5 mmol/L — CL (ref 3.5–5.1)
Sodium: 122 mmol/L — ABNORMAL LOW (ref 135–145)

## 2022-08-12 LAB — HEPARIN LEVEL (UNFRACTIONATED): Heparin Unfractionated: 0.5 IU/mL (ref 0.30–0.70)

## 2022-08-12 MED ORDER — HEPARIN SODIUM (PORCINE) 1000 UNIT/ML IJ SOLN
INTRAMUSCULAR | Status: AC
  Filled 2022-08-12: qty 5

## 2022-08-12 MED ORDER — LIDOCAINE-EPINEPHRINE 1 %-1:100000 IJ SOLN
INTRAMUSCULAR | Status: AC
  Administered 2022-08-12: 10 mL
  Filled 2022-08-12: qty 1

## 2022-08-12 MED ORDER — IOHEXOL 300 MG/ML  SOLN
50.0000 mL | Freq: Once | INTRAMUSCULAR | Status: AC | PRN
  Administered 2022-08-12: 10 mL

## 2022-08-12 MED ORDER — GABAPENTIN 100 MG PO CAPS
100.0000 mg | ORAL_CAPSULE | Freq: Every day | ORAL | Status: DC
  Administered 2022-08-12 – 2022-08-25 (×14): 100 mg via ORAL
  Filled 2022-08-12 (×14): qty 1

## 2022-08-12 NOTE — Progress Notes (Addendum)
PROGRESS NOTE        PATIENT DETAILS Name: Destiny Day Age: 72 y.o. Sex: female Date of Birth: 09-18-1950 Admit Date: 08/25/2022 Admitting Physician Clance Boll, MD QHU:[id number], Rachel Moulds, NP  Brief Summary: Patient is a 72 y.o.  female with history of ESRD on HD MWF, chronic HFpEF, PAD-s/p bilateral TMA who presented with RLQ pain-found to have perforated appendicitis with abscess  Significant events: 1/07>> admitted TRH-RLQ pain-subsequently found to have perforated appendicitis with abscess.  Significant studies: 1/07>> RUQ ultrasound: Cholelithiasis with positive sonographic Murphy sign. 1/07>> CT abdomen/pelvis: Findings suspicious for perforated appendicitis with periappendiceal abscess 1/11>> CT abdomen/pelvis: Interval decrease in size of the right pelvic abscess-s/p percutaneous drainage. 1/15>> HIDA scan: No evidence of acute cholecystitis. 1/15>> CT angio abdominal aorta: Severe atherosclerotic disease throughout abdomen/pelvis and bilateral lower extremities.  Severe left outflow disease with occlusion of left femoral-popliteal bypass graft, occlusion of native left SFA.  Complex collection in the right hemipelvis-enlarged since 1/11 study.  Significant microbiology data: 1/08>> appendix abscess culture: E. coli/Klebsiella  Procedures: 1/08>> CT-guided drainage of right pelvic abscess 1/17>> drain check by IR  Consults: IR ID Vascular surgery General surgery Nephrology  Subjective: Lying comfortably in bed-denies any chest pain or shortness of breath.    Objective: Vitals: Blood pressure (!) 152/54, pulse 68, temperature 97.7 F (36.5 C), temperature source Oral, resp. rate 16, height '5\' 5"'$  (1.651 m), weight 56.2 kg, SpO2 99 %.   Exam: Gen Exam:Alert awake-not in any distress HEENT:atraumatic, normocephalic Chest: B/L clear to auscultation anteriorly CVS:S1S2 regular Abdomen:soft non tender, non distended Extremities:no  edema Neurology: Non focal Skin: no rash  Pertinent Labs/Radiology:    Latest Ref Rng & Units 08/12/2022   10:11 AM 08/11/2022    6:12 AM 08/10/2022    4:46 AM  CBC  WBC 4.0 - 10.5 K/uL 24.5  28.1  28.8   Hemoglobin 12.0 - 15.0 g/dL 11.0  12.9  11.6   Hematocrit 36.0 - 46.0 % 32.5  39.7  34.3   Platelets 150 - 400 K/uL 306  411  359     Lab Results  Component Value Date   NA 128 (L) 08/10/2022   K 3.2 (L) 08/10/2022   CL 90 (L) 08/10/2022   CO2 21 (L) 08/10/2022      Assessment/Plan: Perforated appendicitis with periappendiceal abscess Appendiceal fistula S/p CT-guided drain placed on 1/8-IR performed drain check on 1/17-which demonstrates a appendiceal fistula Continue empiric antibiotics General surgery/IR/ID following. General surgery planning conservative treatment  Critical limb ischemia with rest pain PAD-s/p left femoral-popliteal bypass graft- CT angio with occluded left femoral to below-knee popliteal artery bypass Remains on IV heparin Vascular surgery following-we will await further recommendations  ESRD on HD MWF Nephrology following and directing care  Acute metabolic encephalopathy Hospital delirium In the setting of acute illness/appendix abscess Improved with supportive care Maintain delirium precautions  HFrEF Euvolemic Volume removal with HD  HLD Continue statin  HTN BP stable Continue Coreg/BiDil/amlodipine  DM-2 (A1c 6.2 on 1/8) CBGs stable with SSI  Recent Labs    08/11/22 1223 08/11/22 1504 08/11/22 2140  GLUCAP 188* 179* 131*    Neuropathic pain Rest  pain associated PAD Neurontin  Anxiety Continue with BuSpar as needed  Asthma COPD overlap No wheezing Continue bronchodilators  Complex right ovarian mass Outpatient GYN follow-up-patient/spouse aware of this recommendation-potential  malignancy  Left upper pole renal lesion Concern for malignancy-needs outpatient follow-up with urology for further  workup  Debility/deconditioning PT/OT eval Home health recommended  Nutrition Status: Nutrition Problem: Moderate Malnutrition Etiology: chronic illness Signs/Symptoms: severe muscle depletion, moderate fat depletion Interventions: Nepro shake, MVI, Liberalize Diet  BMI: Estimated body mass index is 20.62 kg/m as calculated from the following:   Height as of this encounter: '5\' 5"'$  (1.651 m).   Weight as of this encounter: 56.2 kg.   Code status:   Code Status: Full Code   DVT Prophylaxis: SCDs Start: 08/14/2022 2213 IV heparin   Family Communication: None at bedside   Disposition Plan: Status is: Inpatient Remains inpatient appropriate because: Severity of illness.   Planned Discharge Destination:Home health   Diet: Diet Order             Diet regular Room service appropriate? Yes with Assist; Fluid consistency: Thin; Fluid restriction: 1200 mL Fluid  Diet effective now                     Antimicrobial agents: Anti-infectives (From admission, onward)    Start     Dose/Rate Route Frequency Ordered Stop   08/10/22 1400  cefTRIAXone (ROCEPHIN) 2 g in sodium chloride 0.9 % 100 mL IVPB        2 g 200 mL/hr over 30 Minutes Intravenous Every 24 hours 08/10/22 1025     08/10/22 1030  metroNIDAZOLE (FLAGYL) tablet 500 mg        500 mg Oral Every 12 hours 08/10/22 1025     08/08/22 1545  piperacillin-tazobactam (ZOSYN) IVPB 2.25 g  Status:  Discontinued        2.25 g 100 mL/hr over 30 Minutes Intravenous Every 8 hours 08/08/22 1455 08/10/22 1025   08/08/22 1415  ceFAZolin (ANCEF) IVPB 1 g/50 mL premix  Status:  Discontinued        1 g 100 mL/hr over 30 Minutes Intravenous Daily 08/08/22 1327 08/08/22 1454   08/20/2022 2245  piperacillin-tazobactam (ZOSYN) IVPB 2.25 g  Status:  Discontinued        2.25 g 100 mL/hr over 30 Minutes Intravenous Every 8 hours 07/31/2022 2215 08/08/22 1327   08/12/2022 1900  piperacillin-tazobactam (ZOSYN) IVPB 3.375 g        3.375 g 100  mL/hr over 30 Minutes Intravenous  Once 08/14/2022 1853 08/01/2022 2049        MEDICATIONS: Scheduled Meds:  acidophilus  1 capsule Oral Daily   amLODipine  5 mg Oral QHS   atorvastatin  40 mg Oral QHS   carvedilol  12.5 mg Oral Q M,W,F-HD   carvedilol  12.5 mg Oral 2 times per day on Tue Thu Sat   Chlorhexidine Gluconate Cloth  6 each Topical Q0600   doxercalciferol  7 mcg Intravenous Q M,W,F-HD   feeding supplement (NEPRO CARB STEADY)  237 mL Oral BID BM   ferric citrate  420 mg Oral TID WC   fluticasone furoate-vilanterol  1 puff Inhalation Daily   Gerhardt's butt cream   Topical TID   insulin aspart  0-6 Units Subcutaneous TID WC   isosorbide-hydrALAZINE  1 tablet Oral 2 times per day on Sun Tue Thu Sat   isosorbide-hydrALAZINE  1 tablet Oral Q M,W,F-HD   metroNIDAZOLE  500 mg Oral Q12H   multivitamin  1 tablet Oral QHS   pantoprazole  40 mg Oral BID   sodium chloride flush  5 mL Intracatheter Q8H   ursodiol  300 mg Oral BID   Continuous Infusions:  cefTRIAXone (ROCEPHIN)  IV Stopped (08/12/22 0008)   heparin 750 Units/hr (08/11/22 2255)   PRN Meds:.albuterol, busPIRone, fluticasone, hydrALAZINE, ondansetron (ZOFRAN) IV, ondansetron, oxyCODONE, prochlorperazine, traZODone   I have personally reviewed following labs and imaging studies  LABORATORY DATA: CBC: Recent Labs  Lab 08/08/22 0154 08/09/22 0502 08/10/22 0446 08/11/22 0612 08/12/22 1011  WBC 24.8* 27.1* 28.8* 28.1* 24.5*  HGB 11.5* 11.6* 11.6* 12.9 11.0*  HCT 34.2* 36.8 34.3* 39.7 32.5*  MCV 95.8 101.9* 95.0 96.8 93.7  PLT 365 320 359 411* 222    Basic Metabolic Panel: Recent Labs  Lab 08/07/22 0627 08/09/22 0502 08/10/22 0446  NA 131* 132* 128*  K 3.1* 3.0* 3.2*  CL 88* 91* 90*  CO2 24 22 21*  GLUCOSE 180* 159* 190*  BUN 36* 34* 41*  CREATININE 5.33* 5.01* 6.06*  CALCIUM 9.5 10.0 10.0  PHOS 5.0* 4.1  --     GFR: Estimated Creatinine Clearance: 7.6 mL/min (A) (by C-G formula based on SCr of  6.06 mg/dL (H)).  Liver Function Tests: Recent Labs  Lab 08/07/22 0627 08/09/22 0502  ALBUMIN 2.3* 2.3*   No results for input(s): "LIPASE", "AMYLASE" in the last 168 hours. No results for input(s): "AMMONIA" in the last 168 hours.  Coagulation Profile: No results for input(s): "INR", "PROTIME" in the last 168 hours.  Cardiac Enzymes: No results for input(s): "CKTOTAL", "CKMB", "CKMBINDEX", "TROPONINI" in the last 168 hours.  BNP (last 3 results) No results for input(s): "PROBNP" in the last 8760 hours.  Lipid Profile: No results for input(s): "CHOL", "HDL", "LDLCALC", "TRIG", "CHOLHDL", "LDLDIRECT" in the last 72 hours.  Thyroid Function Tests: No results for input(s): "TSH", "T4TOTAL", "FREET4", "T3FREE", "THYROIDAB" in the last 72 hours.  Anemia Panel: No results for input(s): "VITAMINB12", "FOLATE", "FERRITIN", "TIBC", "IRON", "RETICCTPCT" in the last 72 hours.  Urine analysis:    Component Value Date/Time   COLORURINE YELLOW 07/07/2016 0914   APPEARANCEUR CLOUDY (A) 07/07/2016 0914   LABSPEC >1.030 (H) 07/07/2016 0914   PHURINE 5.5 07/07/2016 0914   GLUCOSEU NEGATIVE 07/07/2016 0914   HGBUR SMALL (A) 07/07/2016 0914   BILIRUBINUR SMALL (A) 07/07/2016 0914   KETONESUR 15 (A) 07/07/2016 0914   PROTEINUR >300 (A) 07/07/2016 0914   UROBILINOGEN 0.2 07/30/2014 2000   NITRITE NEGATIVE 07/07/2016 0914   LEUKOCYTESUR SMALL (A) 07/07/2016 0914    Sepsis Labs: Lactic Acid, Venous    Component Value Date/Time   LATICACIDVEN 1.2 08/10/2022 0724    MICROBIOLOGY: Recent Results (from the past 240 hour(s))  Aerobic/Anaerobic Culture w Gram Stain (surgical/deep wound)     Status: None   Collection Time: 08/03/22  1:26 PM   Specimen: Wound; Abscess  Result Value Ref Range Status   Specimen Description WOUND  Final   Special Requests  ABCESS ABDOMEN  Final   Gram Stain   Final    ABUNDANT WBC PRESENT, PREDOMINANTLY PMN ABUNDANT GRAM NEGATIVE RODS FEW GRAM POSITIVE  COCCI IN PAIRS IN CHAINS FEW GRAM POSITIVE RODS    Culture   Final    ABUNDANT ESCHERICHIA COLI MODERATE KLEBSIELLA PNEUMONIAE ABUNDANT BACTEROIDES THETAIOTAOMICRON BETA LACTAMASE POSITIVE Performed at [facility], [address], [place], [place] [address]    Report Status 08/08/2022 FINAL  Final   Organism ID, Bacteria ESCHERICHIA COLI  Final   Organism ID, Bacteria KLEBSIELLA PNEUMONIAE  Final      Susceptibility   Escherichia coli - MIC*  AMPICILLIN >=32 RESISTANT Resistant     CEFAZOLIN <=4 SENSITIVE Sensitive     CEFEPIME <=0.12 SENSITIVE Sensitive     CEFTAZIDIME <=1 SENSITIVE Sensitive     CEFTRIAXONE <=0.25 SENSITIVE Sensitive     CIPROFLOXACIN <=0.25 SENSITIVE Sensitive     GENTAMICIN <=1 SENSITIVE Sensitive     IMIPENEM <=0.25 SENSITIVE Sensitive     TRIMETH/SULFA <=20 SENSITIVE Sensitive     AMPICILLIN/SULBACTAM >=32 RESISTANT Resistant     PIP/TAZO <=4 SENSITIVE Sensitive     * ABUNDANT ESCHERICHIA COLI   Klebsiella pneumoniae - MIC*    AMPICILLIN RESISTANT Resistant     CEFAZOLIN <=4 SENSITIVE Sensitive     CEFEPIME <=0.12 SENSITIVE Sensitive     CEFTAZIDIME <=1 SENSITIVE Sensitive     CEFTRIAXONE <=0.25 SENSITIVE Sensitive     CIPROFLOXACIN <=0.25 SENSITIVE Sensitive     GENTAMICIN <=1 SENSITIVE Sensitive     IMIPENEM <=0.25 SENSITIVE Sensitive     TRIMETH/SULFA <=20 SENSITIVE Sensitive     AMPICILLIN/SULBACTAM 4 SENSITIVE Sensitive     PIP/TAZO <=4 SENSITIVE Sensitive     * MODERATE KLEBSIELLA PNEUMONIAE    RADIOLOGY STUDIES/RESULTS: IR Catheter Tube Change  Result Date: 08/12/2022 CLINICAL DATA:  72 year old female with history of right pelvic abscess status post percutaneous drain placement on 08/03/2022 presenting with feculent output from drainage catheter. EXAM: IR CATHETER TUBE CHANGE COMPARISON:  None Available. CONTRAST:  10 mL Omnipaque 300-administered via the existing percutaneous drain. FLUOROSCOPY TIME:  Two mGy TECHNIQUE: The patient  was positioned supine on the fluoroscopy table. A preprocedural spot fluoroscopic image was obtained of the right lower quadrant and the existing percutaneous drainage catheter. Multiple spot fluoroscopic and radiographic images were obtained following the injection of a small amount of contrast via the existing percutaneous drainage catheter. The drainage catheter was then placed to bag drainage. FINDINGS: Contrast injection demonstrates minimal right lower quadrant fluid collection with fistulous connection to what appears to be the appendix which eventually drains into the cecum. 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 No drain manipulation performed. PLAN: Drain was placed to bag drainage from bulb suction. Recommend discontinuation of drain flushing. Follow-up in 3-4 weeks in [facility] with CT abdomen pelvis and fluoroscopic drain injection to assess for abscess/fistula resolution. Ruthann Cancer, MD [facility] Radiology Electronically Signed   By: Ruthann Cancer M.D.   On: 08/12/2022 10:19   CT ANGIO AO+BIFEM W & OR WO CONTRAST  Result Date: 08/10/2022 CLINICAL DATA:  72 year old with rest pain. EXAM: CT ANGIOGRAPHY OF ABDOMINAL AORTA WITH ILIOFEMORAL RUNOFF TECHNIQUE: Multidetector CT imaging of the abdomen, pelvis and lower extremities was performed using the standard protocol during bolus administration of intravenous contrast. Multiplanar CT image reconstructions and MIPs were obtained to evaluate the vascular anatomy. RADIATION DOSE REDUCTION: This exam was performed according to the departmental dose-optimization program which includes automated exposure control, adjustment of the mA and/or kV according to patient size and/or use of iterative reconstruction technique. CONTRAST:  177m OMNIPAQUE IOHEXOL 350 MG/ML SOLN COMPARISON:  CT abdomen and pelvis 08/06/2022 FINDINGS: VASCULAR Aorta:  Diffuse circumferential calcifications involving the abdominal aorta without aneurysm or dissection. No significant aortic stenosis. Celiac: Celiac artery is patent with diffuse atherosclerotic calcifications. At least mild narrowing at the origin of the celiac trunk. No evidence for aneurysm or dissection involving the branch vessels. SMA: SMA is patent with at least moderate stenosis at the origin due to large amount of calcified  plaque. Renals: Bilateral renal arteries are patent with diffuse atherosclerotic plaque. High-grade stenosis involving the right renal artery origin. At least mild to moderate stenosis in the proximal left renal artery. No aneurysm or dissection involving the renal arteries. IMA: Patent RIGHT Lower Extremity Inflow: Circumferential calcifications in the right common iliac artery without significant stenosis. High-grade stenosis or occlusion at the origin of the right internal iliac artery. Diffuse calcified plaque throughout the right external iliac artery. Stenosis in the right external artery is likely hemodynamically significant with scattered areas of narrowing from calcified plaque. Outflow: Postsurgical changes in the right groin and the right common femoral artery is patent. Right profunda femoral arteries are calcified but patent. Native SFA is occluded. Right femoropopliteal bypass graft is tortuous but patent. Graft ties into the distal popliteal artery near the knee. There appears to be some intraluminal stenosis in the bypass graft. Limited evaluation of the distal graft due to motion artifact. Suspect that the distal graft is patent. Popliteal artery just distal to the graft is very limited due to motion artifact. Runoff: Limited evaluation of the runoff vessels due to diffuse calcifications. However, there appears to be flow in the posterior tibial artery and peroneal artery. Evidence for segmental disease throughout the anterior tibial artery and limited evaluation. LEFT  Lower Extremity Inflow: Circumferential calcifications in left common iliac artery without significant stenosis. Probable high-grade stenosis at the origin left internal iliac artery. Narrowing in the mid and distal left external iliac artery from diffuse atherosclerotic disease. Outflow: Left common femoral artery is heavily calcified but patent. Origin of the left profunda femoral artery is heavily calcified. There is flow in the left profunda femoral arteries. Native left SFA is occluded. Left femoropopliteal bypass graft is occluded. Runoff: Limited evaluation due to calcified vessels. There appears to be flow in the left peroneal artery. Occlusive disease in the anterior tibial artery. There appears to be occlusive or segmental disease in the posterior tibial artery. Veins: No obvious venous abnormality within the limitations of this arterial phase study. Review of the MIP images confirms the above findings. NON-VASCULAR Lower chest: Bandlike atelectasis or scarring in the right lower lobe. Otherwise, the visualized lung bases are clear. Hepatobiliary: Evidence for cholelithiasis. Mild distention of the gallbladder is unchanged. Normal appearance of liver. Pancreas: Unremarkable. No pancreatic ductal dilatation or surrounding inflammatory changes. Spleen: Normal in size without focal abnormality. Adrenals/Urinary Tract: Adrenal glands are within normal limits. Atrophy in both kidneys compatible with history of end-stage renal disease. Evidence for bilateral renal cysts that to do not require dedicated follow-up. Urinary bladder is decompressed. Small amount of contrast within the urinary bladder. Stomach/Bowel: High-density material throughout the small and large bowel without dilatation or obstruction. Normal appearance of the stomach. Again noted is material just posterior to the rectum and near the tip of the coccyx on image 135/6. This material measures roughly 2.0 x 2.2 cm and minimally changed from the  exam on 08/06/2022. Concern for increased densities in the posterior subcutaneous tissues along the left posterior aspect of the coccyx on image 124/6. Developing subcutaneous fistula tract cannot be excluded. Lymphatic: No significant lymph node enlargement in the abdomen or pelvis. Reproductive: Status post hysterectomy. No evidence for a left adnexa mass. Again noted is complex structure in the region of the right adnexa containing a small focus of gas and small calcifications. There is a percutaneous drain along the anterior aspect of this complex collection. Collection measures approximately 4.6 x 3.9 cm on image 115/6 and  roughly measured 4.1 x 3.5 cm on the exam from 08/06/2022. Other: No free fluid.  Negative for free air. Musculoskeletal: Bilateral transmetatarsal amputations. No acute bone abnormality. IMPRESSION: VASCULAR 1. Severe atherosclerotic disease throughout the abdomen, pelvis and bilateral lower extremities. Aortic Atherosclerosis (ICD10-I70.0). 2. Bilateral inflow disease. Stenosis involving bilateral external iliac arteries. 3. 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 4. Right femoropopliteal bypass graft is patent but limited evaluation due to motion artifact. 5. Very limited evaluation of the bilateral runoff vessels due to severe vascular calcifications. Likely single-vessel runoff on the left from the peroneal artery. At least 2 vessel runoff in the right lower extremity as described. 6. 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 7. Bilateral renal artery stenosis. NON-VASCULAR 1. Complex collection in the right hemipelvis has slightly enlarged in size since 08/06/2022. The anterior percutaneous drain is still in place and located along the anterior aspect of the collection. Slightly limited evaluation of this structure due to the phase of  contrast. 2. Concern for fluid or material just posterior to the rectum and just inferior to the coccyx. There may also be material or fluid tracking in the posterior pelvic subcutaneous tissues near the coccyx. Recommend attention to this area on follow up imaging. 3. Cholelithiasis. Electronically Signed   By: Markus Daft M.D.   On: 08/10/2022 17:27   NM Hepatobiliary Liver Func  Result Date: 08/10/2022 CLINICAL DATA:  Concern for acute cholecystitis. EXAM: NUCLEAR MEDICINE HEPATOBILIARY IMAGING TECHNIQUE: Sequential images of the abdomen were obtained out to 60 minutes following intravenous administration of radiopharmaceutical. RADIOPHARMACEUTICALS:  5.2 mCi Tc-42m Choletec IV COMPARISON:  CT August 06, 2022. FINDINGS: Prompt uptake and biliary excretion of activity by the liver is seen. Gallbladder activity is visualized, consistent with patency of cystic duct. Biliary activity passes into small bowel, consistent with patent common bile duct. IMPRESSION: Patent cystic and common bile ducts without scintigraphic evidence of acute cholecystitis. Electronically Signed   By: JDahlia BailiffM.D.   On: 08/10/2022 13:39   [place]KG SITE RITE  Result Date: 08/10/2022 If Site Rite image not attached, placement could not be confirmed due to current cardiac rhythm.    LOS: 10 days   SOren Binet MD  [facility]    To contact the attending provider between 7A-7P or the covering provider during after hours 7P-7A, please log into the web site [email or web address] and access using universal [place] password for that web site. If you do not have the password, please call the hospital operator.  08/12/2022, 11:19 AM

## 2022-08-12 NOTE — Progress Notes (Signed)
[facility] Progress Note   Subjective:    Seen in room. 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 She's feeling a litlle better, wants to eat.   Objective Vitals:   08/12/22 0131 08/12/22 0500 08/12/22 0807 08/12/22 0925  BP: (!) 126/47 (!) 147/53 (!) 152/54   Pulse: 68 66 65 68  Resp: '12 12 16 16  '$ Temp: (!) 97.4 F (36.3 C) 98 F (36.7 C) 97.7 F (36.5 C)   TempSrc: Oral Oral Oral   SpO2: 96% 96% 92% 99%  Weight:      Height:       Physical Exam General: Thin chronically ill elderly female Heart: RRR no MRG Lungs: CTA bilaterally nonlabored breathing Abdomen: NABS, soft, non-distended, non-tender with palpation today; RLQ drain in place  Extremities: No pedal edema Dialysis Access: LUA AVG+ bruit NEURO: lethargic, + myoclonus  Filed Weights   08/07/22 0903 08/10/22 0836 08/10/22 1036  Weight: 59.2 kg 56.9 kg 56.2 kg    Intake/Output Summary (Last 24 hours) at 08/12/2022 1033 Last data filed at 08/12/2022 0925 Gross per 24 hour  Intake 430.31 ml  Output 1 ml  Net 429.31 ml     Additional Objective Labs: Basic Metabolic Panel: Recent Labs  Lab 08/07/22 0627 08/09/22 0502 08/10/22 0446  NA 131* 132* 128*  K 3.1* 3.0* 3.2*  CL 88* 91* 90*  CO2 24 22 21*  GLUCOSE 180* 159* 190*  BUN 36* 34* 41*  CREATININE 5.33* 5.01* 6.06*  CALCIUM 9.5 10.0 10.0  PHOS 5.0* 4.1  --     Liver Function Tests: Recent Labs  Lab 08/07/22 0627 08/09/22 0502  ALBUMIN 2.3* 2.3*    No results for input(s): "LIPASE", "AMYLASE" in the last 168 hours.  CBC: Recent Labs  Lab 08/07/22 0627 08/08/22 0154 08/09/22 0502 08/10/22 0446 08/11/22 0612  WBC 20.9* 24.8* 27.1* 28.8* 28.1*  HGB 11.5* 11.5* 11.6* 11.6* 12.9  HCT 36.6 34.2* 36.8 34.3* 39.7  MCV 99.7 95.8 101.9* 95.0 96.8  PLT 420* 365 320 359 411*    Blood Culture    Component Value Date/Time   SDES WOUND 08/03/2022 1326   SPECREQUEST  ABCESS ABDOMEN 08/03/2022 1326   CULT   08/03/2022 1326    ABUNDANT ESCHERICHIA COLI MODERATE KLEBSIELLA PNEUMONIAE ABUNDANT BACTEROIDES THETAIOTAOMICRON BETA LACTAMASE POSITIVE Performed at [facility], [place] [address] [place], [place] [address]    REPTSTATUS 08/08/2022 FINAL 08/03/2022 1326    Cardiac Enzymes: No results for input(s): "CKTOTAL", "CKMB", "CKMBINDEX", "TROPONINI" in the last 168 hours. CBG: Recent Labs  Lab 08/10/22 2013 08/11/22 0756 08/11/22 1223 08/11/22 1504 08/11/22 2140  GLUCAP 193* 184* 188* 179* 131*    Iron Studies: No results for input(s): "IRON", "TIBC", "TRANSFERRIN", "FERRITIN" in the last 72 hours. Lab Results  Component Value Date   INR 1.0 08/03/2022   INR 1.1 05/17/2019   INR 1.02 08/23/2017   Studies/Results: IR Catheter Tube Change  Result Date: 08/12/2022 CLINICAL DATA:  72 year old female with history of right pelvic abscess status post percutaneous drain placement on 08/03/2022 presenting with feculent output from drainage catheter. EXAM: IR CATHETER TUBE CHANGE COMPARISON:  None Available. CONTRAST:  10 mL Omnipaque 300-administered via the existing percutaneous drain. FLUOROSCOPY TIME:  Two mGy TECHNIQUE: The patient was positioned supine on the fluoroscopy table. A preprocedural spot fluoroscopic image was obtained of the right lower quadrant and the existing percutaneous drainage catheter. Multiple spot fluoroscopic and radiographic images were obtained following the  injection of a small amount of contrast via the existing percutaneous drainage catheter. The drainage catheter was then placed to bag drainage. FINDINGS: Contrast injection demonstrates minimal right lower quadrant fluid collection with fistulous connection to what appears to be the appendix which eventually drains into the cecum. 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 No drain manipulation performed. PLAN: Drain was placed to bag drainage from bulb  suction. Recommend discontinuation of drain flushing. Follow-up in 3-4 weeks in [facility] with CT abdomen pelvis and fluoroscopic drain injection to assess for abscess/fistula resolution. Ruthann Cancer, MD [facility] Electronically Signed   By: Ruthann Cancer M.D.   On: 08/12/2022 10:19   CT ANGIO AO+BIFEM W & OR WO CONTRAST  Result Date: 08/10/2022 CLINICAL DATA:  72 year old with rest pain. EXAM: CT ANGIOGRAPHY OF ABDOMINAL AORTA WITH ILIOFEMORAL RUNOFF TECHNIQUE: Multidetector CT imaging of the abdomen, pelvis and lower extremities was performed using the standard protocol during bolus administration of intravenous contrast. Multiplanar CT image reconstructions and MIPs were obtained to evaluate the vascular anatomy. RADIATION DOSE REDUCTION: This exam was performed according to the departmental dose-optimization program which includes automated exposure control, adjustment of the mA and/or kV according to patient size and/or use of iterative reconstruction technique. CONTRAST:  161m OMNIPAQUE IOHEXOL 350 MG/ML SOLN COMPARISON:  CT abdomen and pelvis 08/06/2022 FINDINGS: VASCULAR Aorta: Diffuse circumferential calcifications involving the abdominal aorta without aneurysm or dissection. No significant aortic stenosis. Celiac: Celiac artery is patent with diffuse atherosclerotic calcifications. At least mild narrowing at the origin of the celiac trunk. No evidence for aneurysm or dissection involving the branch vessels. SMA: SMA is patent with at least moderate stenosis at the origin due to large amount of calcified plaque. Renals: Bilateral renal arteries are patent with diffuse atherosclerotic plaque. High-grade stenosis involving the right renal artery origin. At least mild to moderate stenosis in the proximal left renal artery. No aneurysm or dissection involving the renal arteries. IMA: Patent RIGHT Lower Extremity Inflow: Circumferential  calcifications in the right common iliac artery without significant stenosis. High-grade stenosis or occlusion at the origin of the right internal iliac artery. Diffuse calcified plaque throughout the right external iliac artery. Stenosis in the right external artery is likely hemodynamically significant with scattered areas of narrowing from calcified plaque. Outflow: Postsurgical changes in the right groin and the right common femoral artery is patent. Right profunda femoral arteries are calcified but patent. Native SFA is occluded. Right femoropopliteal bypass graft is tortuous but patent. Graft ties into the distal popliteal artery near the knee. There appears to be some intraluminal stenosis in the bypass graft. Limited evaluation of the distal graft due to motion artifact. Suspect that the distal graft is patent. Popliteal artery just distal to the graft is very limited due to motion artifact. Runoff: Limited evaluation of the runoff vessels due to diffuse calcifications. However, there appears to be flow in the posterior tibial artery and peroneal artery. Evidence for segmental disease throughout the anterior tibial artery and limited evaluation. LEFT Lower Extremity Inflow: Circumferential calcifications in left common iliac artery without significant stenosis. Probable high-grade stenosis at the origin left internal iliac artery. Narrowing in the mid and distal left external iliac artery from diffuse atherosclerotic disease. Outflow: Left common femoral artery is heavily calcified but patent. Origin of the left profunda femoral artery is heavily calcified. There is flow in the left profunda femoral arteries. Native left SFA is occluded. Left  femoropopliteal bypass graft is occluded. Runoff: Limited evaluation due to calcified vessels. There appears to be flow in the left peroneal artery. Occlusive disease in the anterior tibial artery. There appears to be occlusive or segmental disease in the posterior  tibial artery. Veins: No obvious venous abnormality within the limitations of this arterial phase study. Review of the MIP images confirms the above findings. NON-VASCULAR Lower chest: Bandlike atelectasis or scarring in the right lower lobe. Otherwise, the visualized lung bases are clear. Hepatobiliary: Evidence for cholelithiasis. Mild distention of the gallbladder is unchanged. Normal appearance of liver. Pancreas: Unremarkable. No pancreatic ductal dilatation or surrounding inflammatory changes. Spleen: Normal in size without focal abnormality. Adrenals/Urinary Tract: Adrenal glands are within normal limits. Atrophy in both kidneys compatible with history of end-stage renal disease. Evidence for bilateral renal cysts that to do not require dedicated follow-up. Urinary bladder is decompressed. Small amount of contrast within the urinary bladder. Stomach/Bowel: High-density material throughout the small and large bowel without dilatation or obstruction. Normal appearance of the stomach. Again noted is material just posterior to the rectum and near the tip of the coccyx on image 135/6. This material measures roughly 2.0 x 2.2 cm and minimally changed from the exam on 08/06/2022. Concern for increased densities in the posterior subcutaneous tissues along the left posterior aspect of the coccyx on image 124/6. Developing subcutaneous fistula tract cannot be excluded. Lymphatic: No significant lymph node enlargement in the abdomen or pelvis. Reproductive: Status post hysterectomy. No evidence for a left adnexa mass. Again noted is complex structure in the region of the right adnexa containing a small focus of gas and small calcifications. There is a percutaneous drain along the anterior aspect of this complex collection. Collection measures approximately 4.6 x 3.9 cm on image 115/6 and roughly measured 4.1 x 3.5 cm on the exam from 08/06/2022. Other: No free fluid.  Negative for free air. Musculoskeletal: Bilateral  transmetatarsal amputations. No acute bone abnormality. IMPRESSION: VASCULAR 1. Severe atherosclerotic disease throughout the abdomen, pelvis and bilateral lower extremities. Aortic Atherosclerosis (ICD10-I70.0). 2. Bilateral inflow disease. Stenosis involving bilateral external iliac arteries. 3. 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 4. Right femoropopliteal bypass graft is patent but limited evaluation due to motion artifact. 5. Very limited evaluation of the bilateral runoff vessels due to severe vascular calcifications. Likely single-vessel runoff on the left from the peroneal artery. At least 2 vessel runoff in the right lower extremity as described. 6. 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 7. Bilateral renal artery stenosis. NON-VASCULAR 1. Complex collection in the right hemipelvis has slightly enlarged in size since 08/06/2022. The anterior percutaneous drain is still in place and located along the anterior aspect of the collection. Slightly limited evaluation of this structure due to the phase of contrast. 2. Concern for fluid or material just posterior to the rectum and just inferior to the coccyx. There may also be material or fluid tracking in the posterior pelvic subcutaneous tissues near the coccyx. Recommend attention to this area on follow up imaging. 3. Cholelithiasis. Electronically Signed   By: Markus Daft M.D.   On: 08/10/2022 17:27   NM Hepatobiliary Liver Func  Result Date: 08/10/2022 CLINICAL DATA:  Concern for acute cholecystitis. EXAM: NUCLEAR MEDICINE HEPATOBILIARY IMAGING TECHNIQUE: Sequential images of the abdomen were obtained out to 60 minutes following intravenous administration of radiopharmaceutical. RADIOPHARMACEUTICALS:  5.2 mCi  Tc-47m Choletec IV COMPARISON:  CT August 06, 2022. FINDINGS: Prompt uptake and biliary excretion  of activity by the liver is seen. Gallbladder activity is visualized, consistent with patency of cystic duct. Biliary activity passes into small bowel, consistent with patent common bile duct. IMPRESSION: Patent cystic and common bile ducts without scintigraphic evidence of acute cholecystitis. Electronically Signed   By: JDahlia BailiffM.D.   On: 08/10/2022 13:39   [place]KG SITE RITE  Result Date: 08/10/2022 If Site Rite image not attached, placement could not be confirmed due to current cardiac rhythm.   Medications:  cefTRIAXone (ROCEPHIN)  IV Stopped (08/12/22 0008)   heparin 750 Units/hr (08/11/22 2255)    acidophilus  1 capsule Oral Daily   amLODipine  5 mg Oral QHS   atorvastatin  40 mg Oral QHS   carvedilol  12.5 mg Oral Q M,W,F-HD   carvedilol  12.5 mg Oral 2 times per day on Tue Thu Sat   Chlorhexidine Gluconate Cloth  6 each Topical Q0600   doxercalciferol  7 mcg Intravenous Q M,W,F-HD   feeding supplement (NEPRO CARB STEADY)  237 mL Oral BID BM   ferric citrate  420 mg Oral TID WC   fluticasone furoate-vilanterol  1 puff Inhalation Daily   Gerhardt's butt cream   Topical TID   insulin aspart  0-6 Units Subcutaneous TID WC   isosorbide-hydrALAZINE  1 tablet Oral 2 times per day on Sun Tue Thu Sat   isosorbide-hydrALAZINE  1 tablet Oral Q M,W,F-HD   metroNIDAZOLE  500 mg Oral Q12H   multivitamin  1 tablet Oral QHS   pantoprazole  40 mg Oral BID   sodium chloride flush  5 mL Intracatheter Q8H   ursodiol  300 mg Oral BID    [place]  [facility]  400/600   57.4kg   2/3.5 bath LUA AVG  Heparin 2500+ 10071mrun - last HD 1/5, post wt 57.3kg - hectorol 7 ug IV tiw - venofer 50 q week - mircera 30 ug q4 wks, last 12/8, due now  Assessment/Plan: Abd pain/ intra-abdominal infection or abscess - per CT imaging. Gen surg and IR consulted, started on IV abx. IR placed drain in RLQ fluid collection. Fluid sent for culture, growing E coli/ Klebsiella. Bacteroides. Per pmd.  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 Per surg/Ir.  ESRD - on HD MWF. HD on schedule. Next HD 1/17  HTN/ volume - euvolemic on exam.  Set UF goal 1-2L as tolerated. Resume home amlodipine.  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  Stopped gabapentin.  Rest per primary.  OgLynnda ChildA-C [facility] 08/12/2022,10:33 AM

## 2022-08-12 NOTE — Procedures (Signed)
Interventional Radiology Procedure Note  Procedure: Drain check  Findings: Please refer to procedural dictation for full description. RLQ pelvic drain injection demonstrates appendiceal fistula.  Complications: None  Estimated Blood Loss: None  Recommendations: Drain placed from bulb suction to bag drainage.  Recommend discontinuation of flushes in hopes of eventual healing of colonic fistula.    Ruthann Cancer, MD Pager: [phone]

## 2022-08-12 NOTE — Evaluation (Signed)
Occupational Therapy Evaluation Patient Details Name: Destiny Day MRN: [id number] DOB: October 12, 1950 Today's Date: 08/12/2022   History of Present Illness 72 year old female admitted 1/7  to the hospital with few days of right lower quadrant abdominal pain.  Imaging in the [facility] concerning for perforated appendicitis with a periappendiceal abscess with probably secondary involvement of the previously noted adnexal mass or a tubo-ovarian abscess.  She was placed on antibiotics, general surgery was consulted and she was admitted to the hospital. JP drain right lower quadrant placed.  PMH: ESRD on MWF HD, asthma/COPD, diastolic CHF, HTN, HLD, DM2   Clinical Impression   Pt walks with a walker, sponge bathes and dresses independently at baseline. She lives with her husband who works. Pt presents with impaired cognition, generalized weakness and poor standing balance. She requires mod assist for bed mobility and to stand. She was able to step-pivot to chair with min assist and increased time. Pt needs min to max assist for ADLs. Recommend HHOT and an aide if family can provide 24 hour care, otherwise pt may need SNF.     Recommendations for follow up therapy are one component of a multi-disciplinary discharge planning process, led by the attending physician.  Recommendations may be updated based on patient status, additional functional criteria and insurance authorization.   Follow Up Recommendations  Home health OT (SNF if pt family cannot proved 24 hour care) Home health aide    Assistance Recommended at Discharge Frequent or constant Supervision/Assistance  Patient can return home with the following A lot of help with walking and/or transfers;A lot of help with bathing/dressing/bathroom;Assistance with cooking/housework;Direct supervision/assist for medications management;Direct supervision/assist for financial management;Assist for transportation;Help with stairs or ramp for entrance     Functional Status Assessment  Patient has had a recent decline in their functional status and demonstrates the ability to make significant improvements in function in a reasonable and predictable amount of time.  Equipment Recommendations  None recommended by OT    Recommendations for Other Services       Precautions / Restrictions Precautions Precautions: Fall Precaution Comments: drain right lower quadrant Required Braces or Orthoses: Other Brace Other Brace: Pt has bil shoes with metal upright braces Restrictions Weight Bearing Restrictions: No      Mobility Bed Mobility Overal bed mobility: Needs Assistance Bed Mobility: Supine to Sit     Supine to sit: Mod assist     General bed mobility comments: assist to raise trunk and for hip to EOB, poor sequencing    Transfers Overall transfer level: Needs assistance Equipment used: Rolling walker (2 wheels), 2 person hand held assist Transfers: Sit to/from Stand, Bed to chair/wheelchair/BSC Sit to Stand: Mod assist, From elevated surface     Step pivot transfers: Min assist     General transfer comment: sit<>stand with hands on RW, assist to rise and steady      Balance Overall balance assessment: Needs assistance   Sitting balance-Leahy Scale: Fair     Standing balance support: Bilateral upper extremity supported, Reliant on assistive device for balance Standing balance-Leahy Scale: Poor                             ADL either performed or assessed with clinical judgement   ADL Overall ADL's : Needs assistance/impaired Eating/Feeding: Minimal assistance;Sitting Eating/Feeding Details (indicate cue type and reason): assist to open packages, cut spaghetti Grooming: Wash/dry hands;Sitting;Set up Grooming Details (indicate cue type and reason):  prior to eating Upper Body Bathing: Moderate assistance;Sitting   Lower Body Bathing: Maximal assistance;Sit to/from stand   Upper Body Dressing : Minimal  assistance;Sitting   Lower Body Dressing: Maximal assistance;Sit to/from stand Lower Body Dressing Details (indicate cue type and reason): assist to don shoes with braces     Toileting- Clothing Manipulation and Hygiene: Total assistance;Sit to/from stand               Vision Baseline Vision/History: 1 Wears glasses Ability to See in Adequate Light: 0 Adequate Patient Visual Report: No change from baseline       Perception     Praxis      Pertinent Vitals/Pain Pain Assessment Pain Assessment: Faces Faces Pain Scale: Hurts little more Pain Location: LEs with touch Pain Descriptors / Indicators: Grimacing, Guarding Pain Intervention(s): Monitored during session, Repositioned     Hand Dominance Right   Extremity/Trunk Assessment Upper Extremity Assessment Upper Extremity Assessment: Generalized weakness;Overall [facility] for tasks assessed   Lower Extremity Assessment Lower Extremity Assessment: Defer to PT evaluation   Cervical / Trunk Assessment Cervical / Trunk Assessment: Kyphotic   Communication Communication Communication: No difficulties   Cognition Arousal/Alertness: Awake/alert Behavior During Therapy: Flat affect Overall Cognitive Status: Impaired/Different from baseline Area of Impairment: Orientation, Memory, Following commands, Safety/judgement, Awareness, Problem solving                 Orientation Level: Disoriented to, Situation, Time   Memory: Decreased short-term memory Following Commands: Follows one step commands with increased time Safety/Judgement: Decreased awareness of deficits Awareness: Intellectual Problem Solving: Slow processing, Decreased initiation, Difficulty sequencing, Requires verbal cues, Requires tactile cues General Comments: pt stating she is in the hospital because the stents in her groin are blocked     General Comments       Exercises     Shoulder Instructions      Home Living Family/patient expects to be  discharged to:: Private residence Living Arrangements: Spouse/significant other Available Help at Discharge: Family;Available PRN/intermittently (husband works full time) Type of Home: House Home Access: Level entry     Home Layout: Two level;Able to live on main level with bedroom/bathroom     Bathroom Shower/Tub: Occupational psychologist: Standard     Home Equipment: Conservation officer, nature (2 wheels);Rollator (4 wheels);Cane - single point;Crutches;Wheelchair - manual   Additional Comments: Drives to end of driveway and Lucianne Lei picks her up for HD; watches TV and may make her own bfast and wash clothes      Prior Functioning/Environment Prior Level of Function : Needs assist             Mobility Comments: walks in house with RW on her own; able to put her braces ADLs Comments: sponge bathes to make it easier, husband cooks and cleans        OT Problem List: Decreased strength;Impaired balance (sitting and/or standing);Decreased cognition;Decreased safety awareness;Decreased knowledge of use of DME or AE      OT Treatment/Interventions: Self-care/ADL training;DME and/or AE instruction;Therapeutic activities;Cognitive remediation/compensation;Patient/family education;Balance training    OT Goals(Current goals can be found in the care plan section) Acute Rehab OT Goals OT Goal Formulation: With patient Time For Goal Achievement: 08/26/22 Potential to Achieve Goals: Good ADL Goals Pt Will Perform Grooming: with min guard assist;sitting Pt Will Perform Upper Body Dressing: sitting;with supervision Pt Will Perform Lower Body Dressing: with min assist;sit to/from stand Pt Will Transfer to Toilet: with min guard assist;ambulating;bedside commode Pt Will Perform Toileting -  Clothing Manipulation and hygiene: with min assist;sit to/from stand Additional ADL Goal #1: Pt will perform bed mobility with supervision in preparation for ADLs.  OT Frequency: Min 2X/week     Co-evaluation              AM-PAC OT "6 Clicks" Daily Activity     Outcome Measure Help from another person eating meals?: A Little Help from another person taking care of personal grooming?: A Little Help from another person toileting, which includes using toliet, bedpan, or urinal?: A Lot Help from another person bathing (including washing, rinsing, drying)?: A Little Help from another person to put on and taking off regular upper body clothing?: A Lot   6 Click Score: 13   End of Session Equipment Utilized During Treatment: Gait belt;Rolling walker (2 wheels)  Activity Tolerance: Patient tolerated treatment well Patient left: in chair;with call bell/phone within reach;with chair alarm set  OT Visit Diagnosis: Unsteadiness on feet (R26.81);Other abnormalities of gait and mobility (R26.89);Muscle weakness (generalized) (M62.81);Other symptoms and signs involving cognitive function                Time: 1202-1226 OT Time Calculation (min): 24 min Charges:  OT General Charges $OT Visit: 1 Visit OT Evaluation $OT Eval Moderate Complexity: 1 Mod OT Treatments $Self Care/Home Management : 8-22 mins  Cleta Alberts, OTR/L Acute Rehabilitation Services Office: [phone]   [name] 08/12/2022, 1:15 PM

## 2022-08-12 NOTE — Progress Notes (Signed)
Pt received in bed no c/os no distress noted stable for HD via LUA AVG UFG 2L

## 2022-08-12 NOTE — Progress Notes (Signed)
ANTICOAGULATION CONSULT NOTE  Pharmacy Consult for Heparin Indication:  Limb ischemia   Allergies  Allergen Reactions   Adhesive  [Tape] Hives   Lactose Intolerance (Gi) Diarrhea   Lisinopril Cough   Prednisone Swelling and Other (See Comments)    Excessive fluid buildup    Patient Measurements: Height: '5\' 5"'$  (165.1 cm) Weight: 56.2 kg (123 lb 14.4 oz) IBW/kg (Calculated) : 57  Vital Signs: Temp: 97.8 F (36.6 C) (01/17 1120) Temp Source: Oral (01/17 1120) BP: 140/34 (01/17 1200) Pulse Rate: 66 (01/17 1200)  Labs: Recent Labs    08/10/22 0446 08/10/22 2027 08/11/22 0612 08/12/22 1011  HGB 11.6*  --  12.9 11.0*  HCT 34.3*  --  39.7 32.5*  PLT 359  --  411* 306  HEPARINUNFRC  --  0.62 0.61 0.50  CREATININE 6.06*  --   --   --      Estimated Creatinine Clearance: 7.6 mL/min (A) (by C-G formula based on SCr of 6.06 mg/dL (H)).   Assessment: 72 year old female to begin heparin for limb ischemia.  Has a drain in place, also with ESRD.  Heparin level therapeutic   Goal of Therapy:  Heparin level 0.3-0.7 units/ml Monitor platelets by anticoagulation protocol: Yes   Plan:  Heparin at 750 units / hr Follow up AM labs  Thank you Anette Guarneri, PharmD 08/12/2022, 12:23 PM

## 2022-08-12 NOTE — Progress Notes (Signed)
   08/12/22 1830  Vitals  Temp 98.9 F (37.2 C)  Pulse Rate 71  Resp 16  BP (!) 153/38  SpO2 99 %  O2 Device Room Air  Post Treatment  Dialyzer Clearance Lightly streaked  Duration of HD Treatment -hour(s) 3.5 hour(s)  Hemodialysis Intake (mL) 0 mL  Liters Processed 84  Fluid Removed (mL) 0 mL  Tolerated HD Treatment No (Comment)  Post-Hemodialysis Comments pt ran 3.5 hours, UF goal not met d/t hypotensive, and feeling sick on the stomach  AVG/AVF Arterial Site Held (minutes) 8 minutes  AVG/AVF Venous Site Held (minutes) 8 minutes   Received patient in bed to unit.  Alert and oriented.  Informed consent signed and in chart.   Treatment initiated:  Treatment completed:   Patient tolerated well.  Transported back to the room  Alert, without acute distress.  Hand-off given to patient's nurse.   Access used: LUA AVF Access issues: none  Total UF removed: 0 Medication(s) given: none    Na'Shaminy T Merrilee [facility]

## 2022-08-12 NOTE — Progress Notes (Signed)
  Daily Progress Note  Subjective: Resting comfortably. No complaints in dialysis.   Objective: Vitals:   08/12/22 1800 08/12/22 1830  BP: (!) 132/43 (!) 153/38  Pulse: 69   Resp: 14   Temp:  98.9 F (37.2 C)  SpO2: 99%     Physical Examination Drain in place.  Nonpalpable in the feet.  No wounds on the feet.   ASSESSMENT/PLAN:  Patient is a 72 year old female with multiple medical problems including pelvic abscess from perforated appendicitis currently with drain.  Critical limb ischemia with rest pain in the left leg.  Had a long conversation with the patient and her son Henrene Pastor regarding her rest pain.  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  I am concerned that revascularization with an active infection would lead to seeding of a bypass graft. Both Henrene Pastor and his mother are aware that in the interim, we will continue heparin, however there can be tissue loss at the foot that can occur and progress.  Rickelle is at high risk of amputation at this time.  Will continue to follow peripherally.    Cassandria Santee MD MS [facility] [phone] 08/12/2022  6:42 PM

## 2022-08-12 NOTE — Progress Notes (Signed)
Subjective/Chief Complaint: Overall feels better compared to yesterday. Doesn't want to go to dialysis.  Drain injection yesterday confirmed fistula to the appendix   Objective: Vital signs in last 24 hours: Temp:  [97.4 F (36.3 C)-98 F (36.7 C)] 97.7 F (36.5 C) (01/17 0807) Pulse Rate:  [65-75] 68 (01/17 0925) Resp:  [12-21] 16 (01/17 0925) BP: (126-162)/(47-68) 152/54 (01/17 0807) SpO2:  [83 %-99 %] 99 % (01/17 0925) Last BM Date : 08/11/22  Intake/Output from previous day: 01/16 0701 - 01/17 0700 In: 550.3 [P.O.:360; I.V.:190.3] Out: 1 [Stool:1] Intake/Output this shift: No intake/output data recorded.  Exam: Awake and alert Abdomen soft, exam stable - RUQ and LLQ abd pain with less guarding, drain to gravity, currently empty   Lab Results:  Recent Labs    08/11/22 0612 08/12/22 1011  WBC 28.1* 24.5*  HGB 12.9 11.0*  HCT 39.7 32.5*  PLT 411* 306   BMET Recent Labs    08/10/22 0446  NA 128*  K 3.2*  CL 90*  CO2 21*  GLUCOSE 190*  BUN 41*  CREATININE 6.06*  CALCIUM 10.0   PT/INR No results for input(s): "LABPROT", "INR" in the last 72 hours. ABG No results for input(s): "PHART", "HCO3" in the last 72 hours.  Invalid input(s): "PCO2", "PO2"  Studies/Results: IR Catheter Tube Change  Result Date: 08/12/2022 CLINICAL DATA:  72 year old female with history of right pelvic abscess status post percutaneous drain placement on 08/03/2022 presenting with feculent output from drainage catheter. EXAM: IR CATHETER TUBE CHANGE COMPARISON:  None Available. CONTRAST:  10 mL Omnipaque 300-administered via the existing percutaneous drain. FLUOROSCOPY TIME:  Two mGy TECHNIQUE: The patient was positioned supine on the fluoroscopy table. A preprocedural spot fluoroscopic image was obtained of the right lower quadrant and the existing percutaneous drainage catheter. Multiple spot fluoroscopic and radiographic images were obtained following the injection of a small  amount of contrast via the existing percutaneous drainage catheter. The drainage catheter was then placed to bag drainage. FINDINGS: Contrast injection demonstrates minimal right lower quadrant fluid collection with fistulous connection to what appears to be the appendix which eventually drains into the cecum. 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 No drain manipulation performed. PLAN: Drain was placed to bag drainage from bulb suction. Recommend discontinuation of drain flushing. Follow-up in 3-4 weeks in [facility] with CT abdomen pelvis and fluoroscopic drain injection to assess for abscess/fistula resolution. Ruthann Cancer, MD [facility] Radiology Electronically Signed   By: Ruthann Cancer M.D.   On: 08/12/2022 10:19   CT ANGIO AO+BIFEM W & OR WO CONTRAST  Result Date: 08/10/2022 CLINICAL DATA:  72 year old with rest pain. EXAM: CT ANGIOGRAPHY OF ABDOMINAL AORTA WITH ILIOFEMORAL RUNOFF TECHNIQUE: Multidetector CT imaging of the abdomen, pelvis and lower extremities was performed using the standard protocol during bolus administration of intravenous contrast. Multiplanar CT image reconstructions and MIPs were obtained to evaluate the vascular anatomy. RADIATION DOSE REDUCTION: This exam was performed according to the departmental dose-optimization program which includes automated exposure control, adjustment of the mA and/or kV according to patient size and/or use of iterative reconstruction technique. CONTRAST:  162m OMNIPAQUE IOHEXOL 350 MG/ML SOLN COMPARISON:  CT abdomen and pelvis 08/06/2022 FINDINGS: VASCULAR Aorta: Diffuse circumferential calcifications involving the abdominal aorta without aneurysm or dissection. No significant aortic stenosis. Celiac: Celiac artery is patent with diffuse atherosclerotic calcifications. At least mild narrowing at the origin of the celiac trunk. No  evidence for aneurysm or dissection involving the branch vessels. SMA: SMA is patent with at least moderate stenosis at the origin due to large amount of calcified plaque. Renals: Bilateral renal arteries are patent with diffuse atherosclerotic plaque. High-grade stenosis involving the right renal artery origin. At least mild to moderate stenosis in the proximal left renal artery. No aneurysm or dissection involving the renal arteries. IMA: Patent RIGHT Lower Extremity Inflow: Circumferential calcifications in the right common iliac artery without significant stenosis. High-grade stenosis or occlusion at the origin of the right internal iliac artery. Diffuse calcified plaque throughout the right external iliac artery. Stenosis in the right external artery is likely hemodynamically significant with scattered areas of narrowing from calcified plaque. Outflow: Postsurgical changes in the right groin and the right common femoral artery is patent. Right profunda femoral arteries are calcified but patent. Native SFA is occluded. Right femoropopliteal bypass graft is tortuous but patent. Graft ties into the distal popliteal artery near the knee. There appears to be some intraluminal stenosis in the bypass graft. Limited evaluation of the distal graft due to motion artifact. Suspect that the distal graft is patent. Popliteal artery just distal to the graft is very limited due to motion artifact. Runoff: Limited evaluation of the runoff vessels due to diffuse calcifications. However, there appears to be flow in the posterior tibial artery and peroneal artery. Evidence for segmental disease throughout the anterior tibial artery and limited evaluation. LEFT Lower Extremity Inflow: Circumferential calcifications in left common iliac artery without significant stenosis. Probable high-grade stenosis at the origin left internal iliac artery. Narrowing in the mid and distal left external iliac artery from diffuse atherosclerotic  disease. Outflow: Left common femoral artery is heavily calcified but patent. Origin of the left profunda femoral artery is heavily calcified. There is flow in the left profunda femoral arteries. Native left SFA is occluded. Left femoropopliteal bypass graft is occluded. Runoff: Limited evaluation due to calcified vessels. There appears to be flow in the left peroneal artery. Occlusive disease in the anterior tibial artery. There appears to be occlusive or segmental disease in the posterior tibial artery. Veins: No obvious venous abnormality within the limitations of this arterial phase study. Review of the MIP images confirms the above findings. NON-VASCULAR Lower chest: Bandlike atelectasis or scarring in the right lower lobe. Otherwise, the visualized lung bases are clear. Hepatobiliary: Evidence for cholelithiasis. Mild distention of the gallbladder is unchanged. Normal appearance of liver. Pancreas: Unremarkable. No pancreatic ductal dilatation or surrounding inflammatory changes. Spleen: Normal in size without focal abnormality. Adrenals/Urinary Tract: Adrenal glands are within normal limits. Atrophy in both kidneys compatible with history of end-stage renal disease. Evidence for bilateral renal cysts that to do not require dedicated follow-up. Urinary bladder is decompressed. Small amount of contrast within the urinary bladder. Stomach/Bowel: High-density material throughout the small and large bowel without dilatation or obstruction. Normal appearance of the stomach. Again noted is material just posterior to the rectum and near the tip of the coccyx on image 135/6. This material measures roughly 2.0 x 2.2 cm and minimally changed from the exam on 08/06/2022. Concern for increased densities in the posterior subcutaneous tissues along the left posterior aspect of the coccyx on image 124/6. Developing subcutaneous fistula tract cannot be excluded. Lymphatic: No significant lymph node enlargement in the abdomen  or pelvis. Reproductive: Status post hysterectomy. No evidence for a left adnexa mass. Again noted is complex structure in the region of the right adnexa containing a small focus of gas  and small calcifications. There is a percutaneous drain along the anterior aspect of this complex collection. Collection measures approximately 4.6 x 3.9 cm on image 115/6 and roughly measured 4.1 x 3.5 cm on the exam from 08/06/2022. Other: No free fluid.  Negative for free air. Musculoskeletal: Bilateral transmetatarsal amputations. No acute bone abnormality. IMPRESSION: VASCULAR 1. Severe atherosclerotic disease throughout the abdomen, pelvis and bilateral lower extremities. Aortic Atherosclerosis (ICD10-I70.0). 2. Bilateral inflow disease. Stenosis involving bilateral external iliac arteries. 3. 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 4. Right femoropopliteal bypass graft is patent but limited evaluation due to motion artifact. 5. Very limited evaluation of the bilateral runoff vessels due to severe vascular calcifications. Likely single-vessel runoff on the left from the peroneal artery. At least 2 vessel runoff in the right lower extremity as described. 6. 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 7. Bilateral renal artery stenosis. NON-VASCULAR 1. Complex collection in the right hemipelvis has slightly enlarged in size since 08/06/2022. The anterior percutaneous drain is still in place and located along the anterior aspect of the collection. Slightly limited evaluation of this structure due to the phase of contrast. 2. Concern for fluid or material just posterior to the rectum and just inferior to the coccyx. There may also be material or fluid tracking in the posterior pelvic subcutaneous tissues near the coccyx. Recommend attention to this area on follow up imaging. 3.  Cholelithiasis. Electronically Signed   By: Markus Daft M.D.   On: 08/10/2022 17:27   NM Hepatobiliary Liver Func  Result Date: 08/10/2022 CLINICAL DATA:  Concern for acute cholecystitis. EXAM: NUCLEAR MEDICINE HEPATOBILIARY IMAGING TECHNIQUE: Sequential images of the abdomen were obtained out to 60 minutes following intravenous administration of radiopharmaceutical. RADIOPHARMACEUTICALS:  5.2 mCi Tc-32m Choletec IV COMPARISON:  CT August 06, 2022. FINDINGS: Prompt uptake and biliary excretion of activity by the liver is seen. Gallbladder activity is visualized, consistent with patency of cystic duct. Biliary activity passes into small bowel, consistent with patent common bile duct. IMPRESSION: Patent cystic and common bile ducts without scintigraphic evidence of acute cholecystitis. Electronically Signed   By: JDahlia BailiffM.D.   On: 08/10/2022 13:39   [place]KG SITE RITE  Result Date: 08/10/2022 If Site Rite image not attached, placement could not be confirmed due to current cardiac rhythm.   Anti-infectives: Anti-infectives (From admission, onward)    Start     Dose/Rate Route Frequency Ordered Stop   08/10/22 1400  cefTRIAXone (ROCEPHIN) 2 g in sodium chloride 0.9 % 100 mL IVPB        2 g 200 mL/hr over 30 Minutes Intravenous Every 24 hours 08/10/22 1025     08/10/22 1030  metroNIDAZOLE (FLAGYL) tablet 500 mg        500 mg Oral Every 12 hours 08/10/22 1025     08/08/22 1545  piperacillin-tazobactam (ZOSYN) IVPB 2.25 g  Status:  Discontinued        2.25 g 100 mL/hr over 30 Minutes Intravenous Every 8 hours 08/08/22 1455 08/10/22 1025   08/08/22 1415  ceFAZolin (ANCEF) IVPB 1 g/50 mL premix  Status:  Discontinued        1 g 100 mL/hr over 30 Minutes Intravenous Daily 08/08/22 1327 08/08/22 1454   08/16/2022 2245  piperacillin-tazobactam (ZOSYN) IVPB 2.25 g  Status:  Discontinued        2.25 g  100 mL/hr over 30 Minutes Intravenous Every 8 hours 07/27/2022 2215 08/08/22 1327   08/15/2022 1900   piperacillin-tazobactam (ZOSYN) IVPB 3.375 g        3.375 g 100 mL/hr over 30 Minutes Intravenous  Once 08/22/2022 1853 08/22/2022 2049       Assessment/Plan: Suspected perforated appendicitis with pelvic abscess - S/p IR drain placement - WBC increased to 24 from 28 - RLQ drain injection study yesterday confirmed fistula 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 IR drain placed to gravity. Abscess cavity significantly decreased in size.  - continue abx per ID. No urgent role for surgery. Hopefully fistula will close over time with drain in place.   FEN: Renal diet with fluid restriction 1272m  ID: abx rocephin/flagyl per ID  VTE: SCD's Foley: none   ESRD CHF HTN Neuropathy HTN OSA   EJill AlexandersPA-C 08/12/2022

## 2022-08-13 DIAGNOSIS — I10 Essential (primary) hypertension: Secondary | ICD-10-CM | POA: Diagnosis not present

## 2022-08-13 DIAGNOSIS — K659 Peritonitis, unspecified: Secondary | ICD-10-CM | POA: Diagnosis not present

## 2022-08-13 DIAGNOSIS — N186 End stage renal disease: Secondary | ICD-10-CM | POA: Diagnosis not present

## 2022-08-13 DIAGNOSIS — D72829 Elevated white blood cell count, unspecified: Secondary | ICD-10-CM | POA: Diagnosis not present

## 2022-08-13 DIAGNOSIS — I739 Peripheral vascular disease, unspecified: Secondary | ICD-10-CM | POA: Diagnosis not present

## 2022-08-13 DIAGNOSIS — K651 Peritoneal abscess: Secondary | ICD-10-CM | POA: Diagnosis not present

## 2022-08-13 LAB — HEPARIN LEVEL (UNFRACTIONATED): Heparin Unfractionated: 0.53 IU/mL (ref 0.30–0.70)

## 2022-08-13 LAB — RENAL FUNCTION PANEL
Albumin: 2.1 g/dL — ABNORMAL LOW (ref 3.5–5.0)
Anion gap: 15 (ref 5–15)
BUN: 11 mg/dL (ref 8–23)
CO2: 24 mmol/L (ref 22–32)
Calcium: 9.3 mg/dL (ref 8.9–10.3)
Chloride: 93 mmol/L — ABNORMAL LOW (ref 98–111)
Creatinine, Ser: 2.92 mg/dL — ABNORMAL HIGH (ref 0.44–1.00)
GFR, Estimated: 17 mL/min — ABNORMAL LOW (ref >60)
Glucose, Bld: 148 mg/dL — ABNORMAL HIGH (ref 70–99)
Phosphorus: 2.6 mg/dL (ref 2.5–4.6)
Potassium: 3.3 mmol/L — ABNORMAL LOW (ref 3.5–5.1)
Sodium: 132 mmol/L — ABNORMAL LOW (ref 135–145)

## 2022-08-13 LAB — GLUCOSE, CAPILLARY
Glucose-Capillary: 154 mg/dL — ABNORMAL HIGH (ref 70–99)
Glucose-Capillary: 137 mg/dL — ABNORMAL HIGH (ref 70–99)
Glucose-Capillary: 148 mg/dL — ABNORMAL HIGH (ref 70–99)
Glucose-Capillary: 157 mg/dL — ABNORMAL HIGH (ref 70–99)

## 2022-08-13 LAB — CBC
HCT: 33.9 % — ABNORMAL LOW (ref 36.0–46.0)
Hemoglobin: 11.5 g/dL — ABNORMAL LOW (ref 12.0–15.0)
MCH: 31.3 pg (ref 26.0–34.0)
MCHC: 33.9 g/dL (ref 30.0–36.0)
MCV: 92.4 fL (ref 80.0–100.0)
Platelets: 347 10*3/uL (ref 150–400)
RBC: 3.67 MIL/uL — ABNORMAL LOW (ref 3.87–5.11)
RDW: 13.2 % (ref 11.5–15.5)
WBC: 27.8 10*3/uL — ABNORMAL HIGH (ref 4.0–10.5)
nRBC: 0 % (ref 0.0–0.2)

## 2022-08-13 MED ORDER — AMLODIPINE BESYLATE 5 MG PO TABS: 5.0000 mg | ORAL_TABLET | Freq: Every day | ORAL | Status: DC

## 2022-08-13 MED ORDER — POTASSIUM CHLORIDE CRYS ER 20 MEQ PO TBCR
40.0000 meq | EXTENDED_RELEASE_TABLET | Freq: Once | ORAL | Status: DC
  Filled 2022-08-13: qty 2

## 2022-08-13 MED ORDER — AMLODIPINE BESYLATE 10 MG PO TABS
10.0000 mg | ORAL_TABLET | Freq: Every day | ORAL | Status: DC
  Administered 2022-08-13 – 2022-08-24 (×11): 10 mg via ORAL
  Filled 2022-08-13 (×14): qty 1

## 2022-08-13 MED ORDER — AMLODIPINE BESYLATE 10 MG PO TABS: 10.0000 mg | ORAL_TABLET | Freq: Every day | ORAL | Status: DC

## 2022-08-13 NOTE — Progress Notes (Signed)
[facility] Progress Note   Subjective:    Seen in room. Has been having diarrhea. Upset, crying -had accident in bed. We called care team.  Dialysis yesterday - didn't meet goal d/t hypotension   Objective Vitals:   08/13/22 0520 08/13/22 0554 08/13/22 0800 08/13/22 0802  BP: (!) 192/66 (!) 181/61  (!) 198/66  Pulse: 81 79  78  Resp: '11 16  15  '$ Temp: 97.9 F (36.6 C)   97.7 F (36.5 C)  TempSrc: Oral   Axillary  SpO2: 93% 99% 98% 97%  Weight:      Height:       Physical Exam General: Thin chronically ill elderly female Heart: RRR no MRG Lungs: CTA bilaterally nonlabored breathing Abdomen: NABS, soft, non-distended, non-tender with palpation today; RLQ drain in place  Extremities: No pedal edema Dialysis Access: LUA AVG+ bruit NEURO: lethargic, + myoclonus  Filed Weights   08/10/22 1036 08/12/22 1435 08/12/22 1851  Weight: 56.2 kg 57.7 kg 57.7 kg    Intake/Output Summary (Last 24 hours) at 08/13/2022 0956 Last data filed at 08/13/2022 0500 Gross per 24 hour  Intake 120 ml  Output 0 ml  Net 120 ml     Additional Objective Labs: Basic Metabolic Panel: Recent Labs  Lab 08/09/22 0502 08/10/22 0446 08/12/22 1432 08/13/22 0329  NA 132* 128* 122* 132*  K 3.0* 3.2* 2.5* 3.3*  CL 91* 90* 84* 93*  CO2 22 21* 21* 24  GLUCOSE 159* 190* 251* 148*  BUN 34* 41* 37* 11  CREATININE 5.01* 6.06* 6.50* 2.92*  CALCIUM 10.0 10.0 9.6 9.3  PHOS 4.1  --  4.4 2.6    Liver Function Tests: Recent Labs  Lab 08/09/22 0502 08/12/22 1432 08/13/22 0329  ALBUMIN 2.3* 2.0* 2.1*    No results for input(s): "LIPASE", "AMYLASE" in the last 168 hours.  CBC: Recent Labs  Lab 08/10/22 0446 08/11/22 0612 08/12/22 1011 08/12/22 1432 08/13/22 0329  WBC 28.8* 28.1* 24.5* 25.5* 27.8*  HGB 11.6* 12.9 11.0* 10.4* 11.5*  HCT 34.3* 39.7 32.5* 30.0* 33.9*  MCV 95.0 96.8 93.7 92.9 92.4  PLT 359 411* 306 334 347    Blood Culture    Component Value Date/Time    SDES WOUND 08/03/2022 1326   SPECREQUEST  ABCESS ABDOMEN 08/03/2022 1326   CULT  08/03/2022 1326    ABUNDANT ESCHERICHIA COLI MODERATE KLEBSIELLA PNEUMONIAE ABUNDANT BACTEROIDES THETAIOTAOMICRON BETA LACTAMASE POSITIVE Performed at [facility], [place] [address], [place], [place] [address]    REPTSTATUS 08/08/2022 FINAL 08/03/2022 1326    Cardiac Enzymes: No results for input(s): "CKTOTAL", "CKMB", "CKMBINDEX", "TROPONINI" in the last 168 hours. CBG: Recent Labs  Lab 08/11/22 2140 08/12/22 0804 08/12/22 1145 08/12/22 2148 08/13/22 0808  GLUCAP 131* 154* 233* 156* 154*    Iron Studies: No results for input(s): "IRON", "TIBC", "TRANSFERRIN", "FERRITIN" in the last 72 hours. Lab Results  Component Value Date   INR 1.0 08/03/2022   INR 1.1 05/17/2019   INR 1.02 08/23/2017   Studies/Results: IR Sinus/Fist Tube Chk-Non GI  Result Date: 08/12/2022 CLINICAL DATA:  72 year old female with history of right pelvic abscess status post percutaneous drain placement on 08/03/2022 presenting with feculent output from drainage catheter. EXAM: IR CATHETER TUBE CHANGE COMPARISON:  None Available. CONTRAST:  10 mL Omnipaque 300-administered via the existing percutaneous drain. FLUOROSCOPY TIME:  Two mGy TECHNIQUE: The patient was positioned supine on the fluoroscopy table. A preprocedural spot fluoroscopic image was obtained of the right lower quadrant  and the existing percutaneous drainage catheter. Multiple spot fluoroscopic and radiographic images were obtained following the injection of a small amount of contrast via the existing percutaneous drainage catheter. The drainage catheter was then placed to bag drainage. FINDINGS: Contrast injection demonstrates minimal right lower quadrant fluid collection with fistulous connection to what appears to be the appendix which eventually drains into the cecum. 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 No drain manipulation performed. PLAN: Drain was placed to bag drainage from bulb suction. Recommend discontinuation of drain flushing. Follow-up in 3-4 weeks in [facility] with CT abdomen pelvis and fluoroscopic drain injection to assess for abscess/fistula resolution. Ruthann Cancer, MD [facility] Radiology Electronically Signed   By: Ruthann Cancer M.D.   On: 08/12/2022 10:19    Medications:  cefTRIAXone (ROCEPHIN)  IV Stopped (08/12/22 2246)   heparin 750 Units/hr (08/11/22 2255)    acidophilus  1 capsule Oral Daily   amLODipine  5 mg Oral QHS   atorvastatin  40 mg Oral QHS   carvedilol  12.5 mg Oral Q M,W,F-HD   carvedilol  12.5 mg Oral 2 times per day on Tue Thu Sat   Chlorhexidine Gluconate Cloth  6 each Topical Q0600   doxercalciferol  7 mcg Intravenous Q M,W,F-HD   feeding supplement (NEPRO CARB STEADY)  237 mL Oral BID BM   ferric citrate  420 mg Oral TID WC   fluticasone furoate-vilanterol  1 puff Inhalation Daily   gabapentin  100 mg Oral QHS   Gerhardt's butt cream   Topical TID   insulin aspart  0-6 Units Subcutaneous TID WC   isosorbide-hydrALAZINE  1 tablet Oral 2 times per day on Sun Tue Thu Sat   isosorbide-hydrALAZINE  1 tablet Oral Q M,W,F-HD   metroNIDAZOLE  500 mg Oral Q12H   multivitamin  1 tablet Oral QHS   pantoprazole  40 mg Oral BID   sodium chloride flush  5 mL Intracatheter Q8H   ursodiol  300 mg Oral BID    Dialysis Orders: [place] MWF  4h  400/600   57.4kg   2/3.5 bath LUA AVG  Heparin 2500+ 1036mdrun - last HD 1/5, post wt 57.3kg - hectorol 7 ug IV tiw - venofer 50 q week - mircera 30 ug q4 wks, last 12/8, due now  Assessment/Plan: Abd pain/ intra-abdominal infection or abscess - per CT imaging. Gen surg and IR consulted, started on IV abx. IR placed drain in RLQ fluid collection. Fluid sent for culture, growing E coli/ Klebsiella. Bacteroides. Per pmd. Per general surgery, repeat CT  slight improvement in size of pelvic CT angio 1/15  - severe atherosclerotic disease, increased size pelvic abscess and new fluid collection- IR following-drain change 1/17 showing appendiceal fistula. Per surgery/IR.  ESRD - on HD MWF. HD on schedule. Next HD 1/19-- 4K bath  HTN/ volume - euvolemic on exam.  Set UF goal 1-2L as tolerated. 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  Stopped gabapentin.  Rest per primary.  OLynnda ChildPA-C [facility] 08/13/2022,9:56 AM

## 2022-08-13 NOTE — Progress Notes (Signed)
Subjective/Chief Complaint: Reports diarrhea and sore bottom. Ongoing intermittent abdominal discomfort. Denies nausea/vomiting.   Objective: Vital signs in last 24 hours: Temp:  [97.7 F (36.5 C)-98.9 F (37.2 C)] 97.7 F (36.5 C) (01/18 0802) Pulse Rate:  [64-83] 78 (01/18 0802) Resp:  [10-25] 15 (01/18 0802) BP: (93-198)/(28-66) 198/66 (01/18 0802) SpO2:  [92 %-99 %] 97 % (01/18 0802) Weight:  [57.7 kg] 57.7 kg (01/17 1851) Last BM Date : 08/11/22  Intake/Output from previous day: 01/17 0701 - 01/18 0700 In: 240 [P.O.:240] Out: 0  Intake/Output this shift: No intake/output data recorded.  Exam: Awake and alert Abdomen soft, exam stable to imroved. Mild RUQ tenderness, drain with nothing in gravity bag, saline in tubing GU: pt with small 1 cm sacral wound without signs of infection. Base is pink and moist.  Perianal exam without cellulitis, induration, or fluctuance, there is some skin breakdown that is not bleeding. No evidence of perianal fistula.    Lab Results:  Recent Labs    08/12/22 1432 08/13/22 0329  WBC 25.5* 27.8*  HGB 10.4* 11.5*  HCT 30.0* 33.9*  PLT 334 347   BMET Recent Labs    08/12/22 1432 08/13/22 0329  NA 122* 132*  K 2.5* 3.3*  CL 84* 93*  CO2 21* 24  GLUCOSE 251* 148*  BUN 37* 11  CREATININE 6.50* 2.92*  CALCIUM 9.6 9.3   PT/INR No results for input(s): "LABPROT", "INR" in the last 72 hours. ABG No results for input(s): "PHART", "HCO3" in the last 72 hours.  Invalid input(s): "PCO2", "PO2"  Studies/Results: IR Sinus/Fist Tube Chk-Non GI  Result Date: 08/12/2022 CLINICAL DATA:  72 year old female with history of right pelvic abscess status post percutaneous drain placement on 08/03/2022 presenting with feculent output from drainage catheter. EXAM: IR CATHETER TUBE CHANGE COMPARISON:  None Available. CONTRAST:  10 mL Omnipaque 300-administered via the existing percutaneous drain. FLUOROSCOPY TIME:  Two mGy TECHNIQUE: The  patient was positioned supine on the fluoroscopy table. A preprocedural spot fluoroscopic image was obtained of the right lower quadrant and the existing percutaneous drainage catheter. Multiple spot fluoroscopic and radiographic images were obtained following the injection of a small amount of contrast via the existing percutaneous drainage catheter. The drainage catheter was then placed to bag drainage. FINDINGS: Contrast injection demonstrates minimal right lower quadrant fluid collection with fistulous connection to what appears to be the appendix which eventually drains into the cecum. 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 No drain manipulation performed. PLAN: Drain was placed to bag drainage from bulb suction. Recommend discontinuation of drain flushing. Follow-up in 3-4 weeks in [facility] with CT abdomen pelvis and fluoroscopic drain injection to assess for abscess/fistula resolution. Ruthann Cancer, MD [facility] Radiology Electronically Signed   By: Ruthann Cancer M.D.   On: 08/12/2022 10:19    Anti-infectives: Anti-infectives (From admission, onward)    Start     Dose/Rate Route Frequency Ordered Stop   08/10/22 1400  cefTRIAXone (ROCEPHIN) 2 g in sodium chloride 0.9 % 100 mL IVPB        2 g 200 mL/hr over 30 Minutes Intravenous Every 24 hours 08/10/22 1025     08/10/22 1030  metroNIDAZOLE (FLAGYL) tablet 500 mg        500 mg Oral Every 12 hours 08/10/22 1025     08/08/22 1545  piperacillin-tazobactam (ZOSYN) IVPB 2.25 g  Status:  Discontinued  2.25 g 100 mL/hr over 30 Minutes Intravenous Every 8 hours 08/08/22 1455 08/10/22 1025   08/08/22 1415  ceFAZolin (ANCEF) IVPB 1 g/50 mL premix  Status:  Discontinued        1 g 100 mL/hr over 30 Minutes Intravenous Daily 08/08/22 1327 08/08/22 1454   08/16/2022 2245  piperacillin-tazobactam (ZOSYN) IVPB 2.25 g  Status:   Discontinued        2.25 g 100 mL/hr over 30 Minutes Intravenous Every 8 hours 07/27/2022 2215 08/08/22 1327   08/07/2022 1900  piperacillin-tazobactam (ZOSYN) IVPB 3.375 g        3.375 g 100 mL/hr over 30 Minutes Intravenous  Once 08/14/2022 1853 08/15/2022 2049       Assessment/Plan: Suspected perforated appendicitis with pelvic abscess - S/p IR drain placement - WBC 27 from 24, unclear etiology but I do feel the patient has adequate source control with IR drain in place next to the appendix. No evidence of perianal infection. - continue abx per ID. No urgent role for surgery. Hopefully fistula will close over time with drain in place.  - I spoke to the patients husband, [name], at the bedside.   FEN: Renal diet with fluid restriction 1213m  ID: abx rocephin/flagyl per ID  VTE: SCD's Foley: none   ESRD CHF HTN Neuropathy HTN OSA   EJill AlexandersPA-C 08/13/2022

## 2022-08-13 NOTE — Progress Notes (Signed)
[facility] for Heparin Indication:  Limb ischemia   Allergies  Allergen Reactions   Adhesive  [Tape] Hives   Lactose Intolerance (Gi) Diarrhea   Lisinopril Cough   Prednisone Swelling and Other (See Comments)    Excessive fluid buildup    Patient Measurements: Height: '5\' 5"'$  (165.1 cm) Weight: 57.7 kg (127 lb 3.3 oz) IBW/kg (Calculated) : 57  Vital Signs: Temp: 97.9 F (36.6 C) (01/18 0520) Temp Source: Oral (01/18 0520) BP: 181/61 (01/18 0554) Pulse Rate: 79 (01/18 0554)  Labs: Recent Labs    08/11/22 0612 08/12/22 1011 08/12/22 1432 08/13/22 0329  HGB 12.9 11.0* 10.4* 11.5*  HCT 39.7 32.5* 30.0* 33.9*  PLT 411* 306 334 347  HEPARINUNFRC 0.61 0.50  --  0.53  CREATININE  --   --  6.50* 2.92*     Estimated Creatinine Clearance: 15.9 mL/min (A) (by C-G formula based on SCr of 2.92 mg/dL (H)).   Assessment: 72 year old female to begin heparin for limb ischemia.  Has a drain in place, also with ESRD.  Heparin level therapeutic   Goal of Therapy:  Heparin level 0.3-0.7 units/ml Monitor platelets by anticoagulation protocol: Yes   Plan:  Heparin at 750 units / hr Follow up AM labs  Thank you Anette Guarneri, PharmD 08/13/2022, 7:58 AM

## 2022-08-13 NOTE — Progress Notes (Signed)
Physical Therapy Treatment Patient Details Name: Destiny Day MRN: [id number] DOB: January 24, 1951 Today's Date: 08/13/2022   History of Present Illness 72 year old female admitted 1/7  to the hospital with few days of right lower quadrant abdominal pain.  Imaging in the ED concerning for perforated appendicitis with a periappendiceal abscess with probably secondary involvement of the previously noted adnexal mass or a tubo-ovarian abscess.  She was placed on antibiotics, general surgery was consulted and she was admitted to the hospital. JP drain right lower quadrant placed.  PMH: ESRD on MWF HD, asthma/COPD, diastolic CHF, HTN, HLD, DM2    PT Comments    Pt agreeable to PT session today, family at bedside. Pt drowsy throughout today's session, able to sit EOB with min guard for safety, however pt reporting she thought she had a bowel movement. Pt returned to supine and performed rolling for hygiene, pt fatigued after bed mobility, declining attempts to sit EOB or stand and transfer today. Pt will continue to benefit from skilled acute PT at this time to progress mobility and address deficits, discharge recommendations remain appropriate as long as family feels comfortable with current level of care.     Recommendations for follow up therapy are one component of a multi-disciplinary discharge planning process, led by the attending physician.  Recommendations may be updated based on patient status, additional functional criteria and insurance authorization.  Follow Up Recommendations  Home health PT (HHaide, HHOT)     Assistance Recommended at Discharge Intermittent Supervision/Assistance  Patient can return home with the following A little help with walking and/or transfers;A little help with bathing/dressing/bathroom;Assistance with cooking/housework;Assist for transportation;Help with stairs or ramp for entrance   Equipment Recommendations  Rolling walker (2 wheels);Wheelchair (measurements  PT);Wheelchair cushion (measurements PT)    Recommendations for Other Services       Precautions / Restrictions Precautions Precautions: Fall Precaution Comments: drain right lower quadrant Required Braces or Orthoses: Other Brace Other Brace: Pt has bil shoes with metal upright braces Restrictions Weight Bearing Restrictions: No     Mobility  Bed Mobility Overal bed mobility: Needs Assistance Bed Mobility: Supine to Sit, Rolling, Sit to Supine Rolling: Min assist   Supine to sit: Mod assist Sit to supine: Mod assist   General bed mobility comments: pt requriing assistance to initiate rolling and help guiding hand to bed rail, assistance needed for strength and balance for supine<>sit    Transfers                   General transfer comment: pt declining all transfers today    Ambulation/Gait                   Stairs             Wheelchair Mobility    Modified Rankin (Stroke Patients Only)       Balance Overall balance assessment: Needs assistance Sitting-balance support: Bilateral upper extremity supported, Feet supported Sitting balance-Leahy Scale: Fair Sitting balance - Comments: utilizing BUE for balance while seated EOB, close guard provided for safety                                    Cognition Arousal/Alertness: Awake/alert (but drowsy) Behavior During Therapy: Flat affect Overall Cognitive Status: Impaired/Different from baseline Area of Impairment: Orientation, Memory, Following commands, Safety/judgement, Awareness, Problem solving  Orientation Level: Disoriented to, Situation, Time   Memory: Decreased short-term memory Following Commands: Follows one step commands with increased time Safety/Judgement: Decreased awareness of deficits Awareness: Intellectual Problem Solving: Slow processing, Decreased initiation, Difficulty sequencing, Requires verbal cues, Requires tactile  cues General Comments: pt drowsy throughout session but following commands with increased time        Exercises      General Comments General comments (skin integrity, edema, etc.): VSS throughout session      Pertinent Vitals/Pain Pain Assessment Pain Assessment: No/denies pain    Home Living                          Prior Function            PT Goals (current goals can now be found in the care plan section) Acute Rehab PT Goals Patient Stated Goal: to go home PT Goal Formulation: With patient/family Time For Goal Achievement: 08/22/22 Potential to Achieve Goals: Good Progress towards PT goals: Progressing toward goals    Frequency    Min 3X/week      PT Plan Current plan remains appropriate (if cognition continues to improve and pt requires min assist or less)    Co-evaluation              AM-PAC PT "6 Clicks" Mobility   Outcome Measure  Help needed turning from your back to your side while in a flat bed without using bedrails?: A Lot Help needed moving from lying on your back to sitting on the side of a flat bed without using bedrails?: A Lot Help needed moving to and from a bed to a chair (including a wheelchair)?: Total Help needed standing up from a chair using your arms (e.g., wheelchair or bedside chair)?: Total Help needed to walk in hospital room?: Total Help needed climbing 3-5 steps with a railing? : Total 6 Click Score: 8    End of Session   Activity Tolerance: Patient limited by fatigue Patient left: with call bell/phone within reach;in bed;with bed alarm set;with family/visitor present Nurse Communication: Mobility status PT Visit Diagnosis: Muscle weakness (generalized) (M62.81)     Time: 1109-1130 PT Time Calculation (min) (ACUTE ONLY): 21 min  Charges:  $Therapeutic Activity: 8-22 mins                     Charlynne Cousins, PT DPT Acute Rehabilitation Services Office [phone]    Luvenia Heller 08/13/2022, 3:24 PM

## 2022-08-13 NOTE — Progress Notes (Addendum)
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 She is s/p ir drain placement with improved abscess size but complicated course with leukocytosis       Micro: 1/08 fresh drain abd abscess cx - ecoli (R amp-sulb; S cefazolin, cipro, bactrim), bacteroides, kleb pna (S cefazolin, cipro, bactrim) 1/08 acute hep B panel positive hep B sAb, negative sAg   Procedures/imaging: 1/08 ir placed abscess percutaneous drain; 30 cc purulent fluid retrieved 1/11 repeat abd pelv ct slight improvement size of abscess 1/15 CTA with run-off showed increased right pelvic abscess and also new presacral fluid collection   W/u so far for increased leukocytosis size now 08/11/22 circles back to abdominal abscess process given most recent ct finding from 1/15. Negative hida. No s/s to suggest cdiff. And doesn't appear there is acute critical limb ischemia/necrosis at this time  We had switched abx to ceftriaxone/flagyl from piptazo to avoid salt/volume load     Spoke with surgery 1/16 who plans to ask ir to adjust drain and discuss presacral fluid material finding   ------- 1/18 assessment Planned vascular intervention for LLE but not until intraabd abscess resolved 1/17 fistula study shows nearly collapsed pelvic abscess  We do not know what is going  on with the presacral fluid density collection that was shown on 1/15 cta.   Her wbc remains high. ?presacral fluid collection worsening vs her LLE ischemia?  Surgery note reviewed   Will have to get full ct abdomen if wbc continues to go up    Plan: Continue ceftriaxone/flagyl Repeat ct early next week --> if presacral fluid collection shows improvement/stability will transition to oral amox-clav and discharge from id standpoint with clinic f/u, if no other issue She needs outpatient f/u with endocrinology/urology/gynecology for other chest/abd/pelv ct finding as mentioned  Discussed with primary team   I spent more than 50 minute reviewing data/chart, and coordinating care and >50% direct face to face time providing counseling/discussing diagnostics/treatment plan with patient    Principal Problem:   Abdominal infection ([place]) Active Problems:   Intra-abdominal abscess ([facility])   Painful and cold lower extremity   Allergies  Allergen Reactions   Adhesive  [Tape] Hives   Lactose Intolerance (Gi) Diarrhea   Lisinopril Cough   Prednisone Swelling and Other (See Comments)    Excessive fluid buildup    Scheduled Meds:  acidophilus  1 capsule Oral Daily   amLODipine  10 mg Oral QHS   atorvastatin  40 mg Oral QHS   carvedilol  12.5 mg Oral Q M,W,F-HD   carvedilol  12.5 mg Oral 2 times per day on Tue Thu Sat   Chlorhexidine Gluconate Cloth  6 each Topical Q0600   doxercalciferol  7 mcg Intravenous Q M,W,F-HD   feeding supplement (NEPRO CARB STEADY)  237 mL Oral BID BM   ferric citrate  420 mg Oral TID WC   fluticasone furoate-vilanterol  1 puff Inhalation Daily   gabapentin  100 mg Oral QHS   Gerhardt's butt cream   Topical TID   insulin aspart  0-6 Units Subcutaneous TID WC   isosorbide-hydrALAZINE  1 tablet Oral 2 times per day on Sun Tue Thu Sat   isosorbide-hydrALAZINE  1 tablet Oral Q M,W,F-HD   metroNIDAZOLE  500 mg Oral Q12H   multivitamin  1 tablet Oral QHS    pantoprazole  40 mg Oral BID   potassium chloride  40 mEq Oral Once   sodium chloride flush  5 mL Intracatheter Q8H   ursodiol  300 mg Oral BID   Continuous Infusions:  cefTRIAXone (ROCEPHIN)  IV Stopped (08/12/22 2246)   heparin 750 Units/hr (08/13/22 1208)   PRN Meds:.albuterol, busPIRone, fluticasone, hydrALAZINE, ondansetron (ZOFRAN) IV, ondansetron, oxyCODONE, prochlorperazine, traZODone   SUBJECTIVE: Pain controlled  Fistula study 1/17 improved/resolved pelvic collection  Reviewed vascular and gen surg note    Review of Systems: ROS All other ROS was negative, except mentioned above     OBJECTIVE: Vitals:   08/13/22 0800 08/13/22 0802 08/13/22 1200 08/13/22 1202  BP:  (!) 198/66 (!) 128/36   Pulse:  78 72   Resp:  15  11  Temp:  97.7 F (36.5 C)    TempSrc:  Axillary    SpO2: 98% 97%  95%  Weight:      Height:       Body mass index is 21.17 kg/m.  Physical Exam General/constitutional: no distress, pleasant HEENT: Normocephalic, PER, Conj Clear, EOMI, Oropharynx clear Neck supple CV: rrr no mrg Lungs: clear to auscultation, normal respiratory effort  Abd: Soft, Nontender - jp drain functioning  Ext: no edema Skin: No Rash Neuro: nonfocal  MSK: bilateral tma; tender bilateral LE L>R   Lab Results Lab Results  Component Value Date   WBC 27.8 (H) 08/13/2022   HGB 11.5 (L) 08/13/2022   HCT 33.9 (L) 08/13/2022   MCV 92.4 08/13/2022   PLT 347 08/13/2022    Lab Results  Component Value Date   CREATININE 2.92 (H) 08/13/2022   BUN 11 08/13/2022   NA 132 (L) 08/13/2022   K 3.3 (L) 08/13/2022   CL 93 (L) 08/13/2022   CO2 24 08/13/2022    Lab Results  Component Value Date   ALT 10 08/05/2022   AST 15 08/05/2022   ALKPHOS 73 08/05/2022   BILITOT 0.5 08/05/2022      Microbiology: No results found for this or any previous visit (from the past 240 hour(s)).    Serology:   Imaging: If present, new imagings (plain films, ct scans, and  mri) have been personally visualized and interpreted; radiology reports have been reviewed. Decision making incorporated into the Impression / Recommendations.   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 No drain manipulation performed.   1/15 hida scan No evidence cholecystitis  1/15 cta abd aorta with run-off IMPRESSION: VASCULAR   1. Severe atherosclerotic disease throughout the abdomen, pelvis and bilateral lower extremities. Aortic Atherosclerosis (ICD10-I70.0). 2. Bilateral inflow disease. Stenosis involving bilateral external iliac arteries. 3. Severe left outflow disease with  occlusion of the left femoropopliteal bypass graft, occlusion of the native left SFA and heavily diseased left profunda femoral arteries. 4. Right femoropopliteal bypass graft is patent but limited evaluation due to motion artifact. 5. Very limited evaluation of the bilateral runoff vessels due to severe vascular calcifications. Likely single-vessel runoff on the left from the peroneal artery. At least 2 vessel runoff in the right lower extremity as described. 6. 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 7. Bilateral renal artery stenosis.   NON-VASCULAR   1. Complex collection in the right hemipelvis has slightly enlarged in size since 08/06/2022. The anterior percutaneous drain is still in place and located along the anterior aspect of the collection. Slightly limited evaluation of this structure due to the phase of contrast. 2. Concern for fluid or material just posterior to the rectum and just inferior to the coccyx. There may also be material or fluid tracking in the posterior pelvic subcutaneous tissues near the coccyx. Recommend attention to this area on follow up imaging. 3. Cholelithiasis.   1/11 ct abd pelv with contrast 1. Significant interval decrease in size of the right pelvic abscess status  post percutaneous drainage, with pigtail drainage catheter appropriately positioned. There are a few residual tiny foci of internal gas, but no significant residual fluid component. The tip of the appendix terminates within and is inseparable from this area, again suspicious for perforated appendicitis. There are several loops of small bowel adjacent to this area, but without obvious fistula or small-bowel inflammatory change. This area remains inseparable from the right ovary. 2. Unchanged 3.8 cm complex right ovarian mass with internal calcification, previously characterized on prior ultrasound. Gynecological consultation was recommended at that time. 3. Unchanged 2.1 cm heterogeneously enhancing left upper pole renal lesion, suspicious for primary renal neoplasm. Outpatient non-emergent follow-up renal protocol CT or MRI with and without contrast is recommended for further evaluation. 4. Unchanged cholelithiasis. 5.  Aortic Atherosclerosis     1/09 cxr No acute cardiopulm process     1/07 abd pelv ct with contrast 1. Interval enlargement of the previously noted loculated collection along the right pelvic sidewall with irregular mural enhancement, enhancing internal septa, and mild surrounding inflammatory stranding. Additionally, there has developed gas within the superior aspect of the mass, best seen on axial image # 68/3. When compared to remote prior examination of 10/15/2020, this is seen immediately adjacent to the appendix and complex right adnexal cystic lesion. Altogether, the findings are suspicious for a perforated appendicitis with a periappendiceal abscess with probable secondary involvement of the previously noted adnexal mass or a tubo-ovarian abscess. A malignant lesion is considered less likely given its relatively rapid evolution though a secondarily infected malignancy could appear in this fashion. Surgical consultation is recommended. 2. 2.1 cm  hypoenhancing lesion within the upper pole of the left kidney suspicious for a primary renal neoplasm. Dedicated renal mass protocol CT or MRI examination is recommended for evaluation once the patient's acute issues have resolved. 3. Cholelithiasis. 4. Small right fat containing femoral hernia. Previously noted bowel within the hernia sac has spontaneously reduced.     1/07 ruq [place] 1. Interval enlargement of the previously noted loculated collection along the right pelvic sidewall with irregular mural enhancement, enhancing internal septa, and mild surrounding inflammatory stranding. Additionally, there has developed gas within the superior aspect of the mass, best seen on axial image # 68/3. When compared to remote prior examination of 10/15/2020, this is seen immediately adjacent to the appendix and  complex right adnexal cystic lesion. Altogether, the findings are suspicious for a perforated appendicitis with a periappendiceal abscess with probable secondary involvement of the previously noted adnexal mass or a tubo-ovarian abscess. A malignant lesion is considered less likely given its relatively rapid evolution though a secondarily infected malignancy could appear in this fashion. Surgical consultation is recommended. 2. 2.1 cm hypoenhancing lesion within the upper pole of the left kidney suspicious for a primary renal neoplasm. Dedicated renal mass protocol CT or MRI examination is recommended for evaluation once the patient's acute issues have resolved. 3. Cholelithiasis. 4. Small right fat containing femoral hernia. Previously noted bowel within the hernia sac has spontaneously reduced.     Jabier Mutton, [facility] [place] [phone] pager    08/13/2022, 3:01 PM

## 2022-08-13 NOTE — Progress Notes (Addendum)
PROGRESS NOTE        PATIENT DETAILS Name: Destiny Day Age: 72 y.o. Sex: female Date of Birth: Nov 01, 1950 Admit Date: 08/12/2022 Admitting Physician Clance Boll, MD DJT:[id number], Rachel Moulds, NP  Brief Summary: Patient is a 72 y.o.  female with history of ESRD on HD MWF, chronic HFpEF, PAD-s/p bilateral TMA who presented with RLQ pain-found to have perforated appendicitis with abscess  Significant events: 1/07>> admitted TRH-RLQ pain-subsequently found to have perforated appendicitis with abscess.  Significant studies: 1/07>> RUQ ultrasound: Cholelithiasis with positive sonographic Murphy sign. 1/07>> CT abdomen/pelvis: Findings suspicious for perforated appendicitis with periappendiceal abscess 1/11>> CT abdomen/pelvis: Interval decrease in size of the right pelvic abscess-s/p percutaneous drainage. 1/15>> HIDA scan: No evidence of acute cholecystitis. 1/15>> CT angio abdominal aorta: Severe atherosclerotic disease throughout abdomen/pelvis and bilateral lower extremities.  Severe left outflow disease with occlusion of left femoral-popliteal bypass graft, occlusion of native left SFA.  Complex collection in the right hemipelvis-enlarged since 1/11 study.  Significant microbiology data: 1/08>> appendix abscess culture: E. coli/Klebsiella  Procedures: 1/08>> CT-guided drainage of right pelvic abscess 1/17>> drain check by IR  Consults: IR ID Vascular surgery General surgery Nephrology  Subjective: Some mild RLL pain-minimal pain in her lower extremities today.  Objective: Vitals: Blood pressure (!) 198/66, pulse 78, temperature 97.7 F (36.5 C), temperature source Axillary, resp. rate 15, height '5\' 5"'$  (1.651 m), weight 57.7 kg, SpO2 97 %.   Exam: Gen Exam:Alert awake-not in any distress HEENT:atraumatic, normocephalic Chest: B/L clear to auscultation anteriorly CVS:S1S2 regular Abdomen:soft non tender, non distended Extremities:no  edema Neurology: Non focal-but with generalized weakness. 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  ESRD on HD MWF Nephrology following and directing care  Acute metabolic encephalopathy Hospital delirium In the setting of acute illness/appendix abscess Improved with supportive care Maintain delirium precautions  HFrEF Euvolemic Volume removal with HD  HLD Continue statin  HTN BP on the higher side today-increase amlodipine to 10 mg. Continue Coreg/BiDil  DM-2 (A1c 6.2 on 1/8) CBGs stable with SSI  Recent Labs    08/12/22 1145 08/12/22 2148 08/13/22 0808  GLUCAP 233* 156* 154*    Hypokalemia Replete/recheck  Neuropathic pain Rest  pain associated PAD  Neurontin  Anxiety Continue with BuSpar as needed  Asthma COPD overlap No wheezing Continue bronchodilators  Complex right ovarian mass Outpatient  GYN follow-up-patient/spouse aware of this recommendation-potential malignancy  Left upper pole renal lesion Concern for malignancy-needs outpatient follow-up with urology for further workup  Debility/deconditioning PT/OT eval Home health recommended  Nutrition Status: Nutrition Problem: Moderate Malnutrition Etiology: chronic illness Signs/Symptoms: severe muscle depletion, moderate fat depletion Interventions: Nepro shake, MVI, Liberalize Diet  BMI: Estimated body mass index is 21.17 kg/m as calculated from the following:   Height as of this encounter: '5\' 5"'$  (1.651 m).   Weight as of this encounter: 57.7 kg.   Code status:   Code Status: Full Code   DVT Prophylaxis: SCDs Start: 08/16/2022 2213 IV heparin   Family Communication: Son James-[phone]-left voicemail on 1/18   Disposition Plan: Status is: Inpatient Remains inpatient appropriate because: Severity of illness.   Planned Discharge Destination:Home health   Diet: Diet Order             Diet regular Room service appropriate? Yes with Assist; Fluid consistency: Thin; Fluid restriction: 1200 mL Fluid  Diet effective now                     Antimicrobial agents: Anti-infectives (From admission, onward)    Start     Dose/Rate Route Frequency Ordered Stop   08/10/22 1400  cefTRIAXone (ROCEPHIN) 2 g in sodium chloride 0.9 % 100 mL IVPB        2 g 200 mL/hr over 30 Minutes Intravenous Every 24 hours 08/10/22 1025     08/10/22 1030  metroNIDAZOLE (FLAGYL) tablet 500 mg        500 mg Oral Every 12 hours 08/10/22 1025     08/08/22 1545  piperacillin-tazobactam (ZOSYN) IVPB 2.25 g  Status:  Discontinued        2.25 g 100 mL/hr over 30 Minutes Intravenous Every 8 hours 08/08/22 1455 08/10/22 1025   08/08/22 1415  ceFAZolin (ANCEF) IVPB 1 g/50 mL premix  Status:  Discontinued        1 g 100 mL/hr over 30 Minutes Intravenous Daily 08/08/22 1327 08/08/22 1454   08/08/2022 2245  piperacillin-tazobactam  (ZOSYN) IVPB 2.25 g  Status:  Discontinued        2.25 g 100 mL/hr over 30 Minutes Intravenous Every 8 hours 07/29/2022 2215 08/08/22 1327   08/13/2022 1900  piperacillin-tazobactam (ZOSYN) IVPB 3.375 g        3.375 g 100 mL/hr over 30 Minutes Intravenous  Once 08/04/2022 1853 07/30/2022 2049        MEDICATIONS: Scheduled Meds:  acidophilus  1 capsule Oral Daily   amLODipine  5 mg Oral QHS   atorvastatin  40 mg Oral QHS   carvedilol  12.5 mg Oral Q M,W,F-HD   carvedilol  12.5 mg Oral 2 times per day on Tue Thu Sat   Chlorhexidine Gluconate Cloth  6 each Topical Q0600   doxercalciferol  7 mcg Intravenous Q M,W,F-HD   feeding supplement (NEPRO CARB STEADY)  237 mL Oral BID BM   ferric citrate  420 mg Oral TID WC   fluticasone furoate-vilanterol  1 puff Inhalation Daily   gabapentin  100 mg Oral QHS   Gerhardt's butt cream   Topical TID   insulin aspart  0-6 Units Subcutaneous TID WC   isosorbide-hydrALAZINE  1 tablet Oral 2 times per day on Sun Tue Thu Sat   isosorbide-hydrALAZINE  1 tablet Oral Q  M,W,F-HD   metroNIDAZOLE  500 mg Oral Q12H   multivitamin  1 tablet Oral QHS   pantoprazole  40 mg Oral BID   sodium chloride flush  5 mL Intracatheter Q8H   ursodiol  300 mg Oral BID   Continuous Infusions:  cefTRIAXone (ROCEPHIN)  IV Stopped (08/12/22 2246)   heparin 750 Units/hr (08/11/22 2255)   PRN Meds:.albuterol, busPIRone, fluticasone, hydrALAZINE, ondansetron (ZOFRAN) IV, ondansetron, oxyCODONE, prochlorperazine, traZODone   I have personally reviewed following labs and imaging studies  LABORATORY DATA: CBC: Recent Labs  Lab 08/10/22 0446 08/11/22 0612 08/12/22 1011 08/12/22 1432 08/13/22 0329  WBC 28.8* 28.1* 24.5* 25.5* 27.8*  HGB 11.6* 12.9 11.0* 10.4* 11.5*  HCT 34.3* 39.7 32.5* 30.0* 33.9*  MCV 95.0 96.8 93.7 92.9 92.4  PLT 359 411* 306 334 347     Basic Metabolic Panel: Recent Labs  Lab 08/07/22 0627 08/09/22 0502 08/10/22 0446 08/12/22 1432  08/13/22 0329  NA 131* 132* 128* 122* 132*  K 3.1* 3.0* 3.2* 2.5* 3.3*  CL 88* 91* 90* 84* 93*  CO2 24 22 21* 21* 24  GLUCOSE 180* 159* 190* 251* 148*  BUN 36* 34* 41* 37* 11  CREATININE 5.33* 5.01* 6.06* 6.50* 2.92*  CALCIUM 9.5 10.0 10.0 9.6 9.3  PHOS 5.0* 4.1  --  4.4 2.6     GFR: Estimated Creatinine Clearance: 15.9 mL/min (A) (by C-G formula based on SCr of 2.92 mg/dL (H)).  Liver Function Tests: Recent Labs  Lab 08/07/22 0627 08/09/22 0502 08/12/22 1432 08/13/22 0329  ALBUMIN 2.3* 2.3* 2.0* 2.1*    No results for input(s): "LIPASE", "AMYLASE" in the last 168 hours. No results for input(s): "AMMONIA" in the last 168 hours.  Coagulation Profile: No results for input(s): "INR", "PROTIME" in the last 168 hours.  Cardiac Enzymes: No results for input(s): "CKTOTAL", "CKMB", "CKMBINDEX", "TROPONINI" in the last 168 hours.  BNP (last 3 results) No results for input(s): "PROBNP" in the last 8760 hours.  Lipid Profile: No results for input(s): "CHOL", "HDL", "LDLCALC", "TRIG", "CHOLHDL", "LDLDIRECT" in the last 72 hours.  Thyroid Function Tests: No results for input(s): "TSH", "T4TOTAL", "FREET4", "T3FREE", "THYROIDAB" in the last 72 hours.  Anemia Panel: No results for input(s): "VITAMINB12", "FOLATE", "FERRITIN", "TIBC", "IRON", "RETICCTPCT" in the last 72 hours.  Urine analysis:    Component Value Date/Time   COLORURINE YELLOW 07/07/2016 0914   APPEARANCEUR CLOUDY (A) 07/07/2016 0914   LABSPEC >1.030 (H) 07/07/2016 0914   PHURINE 5.5 07/07/2016 0914   GLUCOSEU NEGATIVE 07/07/2016 0914   HGBUR SMALL (A) 07/07/2016 0914   BILIRUBINUR SMALL (A) 07/07/2016 0914   KETONESUR 15 (A) 07/07/2016 0914   PROTEINUR >300 (A) 07/07/2016 0914   UROBILINOGEN 0.2 07/30/2014 2000   NITRITE NEGATIVE 07/07/2016 0914   LEUKOCYTESUR SMALL (A) 07/07/2016 0914    Sepsis Labs: Lactic Acid, Venous    Component Value Date/Time   LATICACIDVEN 1.2 08/10/2022 0724     MICROBIOLOGY: Recent Results (from the past 240 hour(s))  Aerobic/Anaerobic Culture w Gram Stain (surgical/deep wound)     Status: None   Collection Time: 08/03/22  1:26 PM   Specimen: Wound; Abscess  Result Value Ref Range Status   Specimen Description WOUND  Final   Special Requests  ABCESS ABDOMEN  Final   Gram Stain   Final    ABUNDANT WBC PRESENT, PREDOMINANTLY PMN ABUNDANT GRAM NEGATIVE RODS FEW GRAM POSITIVE COCCI IN PAIRS IN CHAINS FEW GRAM POSITIVE RODS    Culture   Final  ABUNDANT ESCHERICHIA COLI MODERATE KLEBSIELLA PNEUMONIAE ABUNDANT BACTEROIDES THETAIOTAOMICRON BETA LACTAMASE POSITIVE Performed at [facility] [place] [address], [place], [place] [address]    Report Status 08/08/2022 FINAL  Final   Organism ID, Bacteria ESCHERICHIA COLI  Final   Organism ID, Bacteria KLEBSIELLA PNEUMONIAE  Final      Susceptibility   Escherichia coli - MIC*    AMPICILLIN >=32 RESISTANT Resistant     CEFAZOLIN <=4 SENSITIVE Sensitive     CEFEPIME <=0.12 SENSITIVE Sensitive     CEFTAZIDIME <=1 SENSITIVE Sensitive     CEFTRIAXONE <=0.25 SENSITIVE Sensitive     CIPROFLOXACIN <=0.25 SENSITIVE Sensitive     GENTAMICIN <=1 SENSITIVE Sensitive     IMIPENEM <=0.25 SENSITIVE Sensitive     TRIMETH/SULFA <=20 SENSITIVE Sensitive     AMPICILLIN/SULBACTAM >=32 RESISTANT Resistant     PIP/TAZO <=4 SENSITIVE Sensitive     * ABUNDANT ESCHERICHIA COLI   Klebsiella pneumoniae - MIC*    AMPICILLIN RESISTANT Resistant     CEFAZOLIN <=4 SENSITIVE Sensitive     CEFEPIME <=0.12 SENSITIVE Sensitive     CEFTAZIDIME <=1 SENSITIVE Sensitive     CEFTRIAXONE <=0.25 SENSITIVE Sensitive     CIPROFLOXACIN <=0.25 SENSITIVE Sensitive     GENTAMICIN <=1 SENSITIVE Sensitive     IMIPENEM <=0.25 SENSITIVE Sensitive     TRIMETH/SULFA <=20 SENSITIVE Sensitive     AMPICILLIN/SULBACTAM 4 SENSITIVE Sensitive     PIP/TAZO <=4 SENSITIVE Sensitive     * MODERATE KLEBSIELLA PNEUMONIAE    RADIOLOGY  STUDIES/RESULTS: IR Sinus/Fist Tube Chk-Non GI  Result Date: 08/12/2022 CLINICAL DATA:  72 year old female with history of right pelvic abscess status post percutaneous drain placement on 08/03/2022 presenting with feculent output from drainage catheter. EXAM: IR CATHETER TUBE CHANGE COMPARISON:  None Available. CONTRAST:  10 mL Omnipaque 300-administered via the existing percutaneous drain. FLUOROSCOPY TIME:  Two mGy TECHNIQUE: The patient was positioned supine on the fluoroscopy table. A preprocedural spot fluoroscopic image was obtained of the right lower quadrant and the existing percutaneous drainage catheter. Multiple spot fluoroscopic and radiographic images were obtained following the injection of a small amount of contrast via the existing percutaneous drainage catheter. The drainage catheter was then placed to bag drainage. FINDINGS: Contrast injection demonstrates minimal right lower quadrant fluid collection with fistulous connection to what appears to be the appendix which eventually drains into the cecum. 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 No drain manipulation performed. PLAN: Drain was placed to bag drainage from bulb suction. Recommend discontinuation of drain flushing. Follow-up in 3-4 weeks in [facility] with CT abdomen pelvis and fluoroscopic drain injection to assess for abscess/fistula resolution. Ruthann Cancer, MD [facility] Radiology Electronically Signed   By: Ruthann Cancer M.D.   On: 08/12/2022 10:19     LOS: 11 days   Oren Binet, MD  [facility]    To contact the attending provider between 7A-7P or the covering provider during after hours 7P-7A, please log into the web site [email or web address] and access using universal [place] password for that web site. If you do not have the password, please call the hospital operator.  08/13/2022, 10:08  AM

## 2022-08-13 NOTE — Progress Notes (Signed)
Referring Physician(s): Obie Dredge, PA-C  Supervising Physician: Corrie Mckusick  Patient Status:  [facility] - In-pt  Chief Complaint: Perforated appendicitis with abscess s/p drain placement 08/03/22 in IR by Dr. Earleen Newport.   Subjective: Patient in bed awake/alert but disoriented. She has a church member visiting with her. She is complaining about generalized pain/discomfort.   Allergies: Adhesive  [tape], Lactose intolerance (gi), Lisinopril, and Prednisone  Medications: Prior to Admission medications   Medication Sig Start Date End Date Taking? Authorizing Provider  albuterol (VENTOLIN HFA) 108 (90 Base) MCG/ACT inhaler Inhale 2 puffs into the lungs every 4 (four) hours as needed for wheezing or shortness of breath. 07/21/22  Yes Piontek, Erin, MD  amLODipine (NORVASC) 10 MG tablet Take 10 mg by mouth daily. 02/09/22  Yes [provider]  aspirin EC 81 MG tablet Take 81 mg by mouth daily.   Yes [provider]  atorvastatin (LIPITOR) 40 MG tablet Take 40 mg by mouth at bedtime. 07/17/21  Yes [provider]  b complex-vitamin c-folic acid (NEPHRO-VITE) 0.8 MG TABS tablet Take 1 tablet by mouth at bedtime.   Yes [provider]  busPIRone (BUSPAR) 7.5 MG tablet Take 7.5 mg by mouth daily as needed (anxiety). 10/29/19  Yes [provider]  carvedilol (COREG) 12.5 MG tablet Take 12.5 mg by mouth See admin instructions. Take 1 tablet (12.5 mg) by mouth once daily in the evening on Mondays, Wednesdays, & Fridays. (Dialysis) Take 1 tablet (12.5 mg) by mouth twice daily on Sundays, Tuesdays, Thursdays, & Saturdays. (Non Dialysis)   Yes [provider]  cinacalcet (SENSIPAR) 60 MG tablet Take 60 mg by mouth See admin instructions. Take 1 tablet (60 mg) by mouth in the evening on Mondays, Wednesdays, & Fridays (dialysis) Take 1 tablet (60 mg) by mouth twice daily on Sundays, Tuesdays, Thursdays, Saturdays. (non dialysis) 11/28/18  Yes [provider]  diphenhydrAMINE HCl (BENADRYL ALLERGY PO) Take 1 capsule by mouth every Monday, Wednesday, and Friday with hemodialysis. If needed to help with allergies   Yes [provider]  ferric citrate (AURYXIA) 1 GM 210 MG(Fe) tablet Take 210-420 mg by mouth See admin instructions. Take 2 tablets (420 mg) by mouth with each meal & take 1 tablet (210 mg) by mouth with snacks.   Yes [provider]  fluticasone (FLONASE) 50 MCG/ACT nasal spray Place 2 sprays into both nostrils daily as needed for allergies or rhinitis. 04/07/22  Yes Valentina Shaggy, MD  gabapentin (NEURONTIN) 100 MG capsule TAKE 1 CAPSULE (100 MG TOTAL) BY MOUTH AT BEDTIME. WHEN NECESSARY FOR NEUROPATHY PAIN Patient taking differently: Take 100 mg by mouth at bedtime as needed (When necessary for neuropathy pain). 12/13/18  Yes Newt Minion, MD  glipiZIDE (GLUCOTROL) 5 MG tablet Take 5 mg by mouth at bedtime.   Yes [provider]  isosorbide-hydrALAZINE (BIDIL) 20-37.5 MG tablet Take 1 tablet by mouth See admin instructions. hs on Monday,Wednesday,Friday (dialysis days) Tid on Tuesday,Thursday,Saturday,Sunday(non-dialysis days) Patient taking differently: Take 1 tablet by mouth See admin instructions. Take in the evening on Monday,Wednesday,Friday (dialysis days) BID on Tuesday,Thursday,Saturday,Sunday(non-dialysis days) 01/08/22  Yes Cantwell, Celeste C, PA-C  lidocaine-prilocaine (EMLA) cream Apply 1 Application topically as needed. 03/31/22  Yes Vanessa Kick, MD  loperamide (IMODIUM) 2 MG capsule Take 2 capsules (4 mg total) by mouth 3 (three) times daily as needed for diarrhea or loose stools. 08/04/21  Yes Bonnielee Haff, MD  Methoxy PEG-Epoetin Beta (MIRCERA IJ) Mircera 08/20/21 10/23/22  Yes [provider]  montelukast (SINGULAIR) 10 MG tablet TAKE 1 TABLET BY MOUTH EVERYDAY AT BEDTIME Patient taking differently: Take 10 mg by mouth at bedtime. 04/07/22  Yes Valentina Shaggy, MD   omeprazole (PRILOSEC) 20 MG capsule Take 20 mg by mouth daily as needed (for acid reflux).   Yes [provider]  ondansetron (ZOFRAN) 4 MG tablet Take 1 tablet (4 mg total) by mouth every 6 (six) hours as needed for nausea. 05/22/19  Yes Aline August, MD  promethazine-dextromethorphan (PROMETHAZINE-DM) 6.25-15 MG/5ML syrup Take 5 mLs by mouth 4 (four) times daily as needed for cough. 07/21/22  Yes Piontek, Junie Panning, MD  traZODone (DESYREL) 50 MG tablet Take 25-50 mg by mouth at bedtime as needed for sleep. 10/29/19  Yes [provider]  ursodiol (ACTIGALL) 250 MG tablet Take 250 mg by mouth 2 (two) times daily. 07/28/22  Yes [provider]  budesonide-formoterol (SYMBICORT) 160-4.5 MCG/ACT inhaler Inhale 2 puffs into the lungs 2 (two) times daily. Patient not taking: Reported on 05/28/2022 04/07/22   Valentina Shaggy, MD  EPINEPHrine (AUVI-Q) 0.3 mg/0.3 mL IJ SOAJ injection Inject 0.3 mg into the muscle as needed for anaphylaxis. Patient not taking: Reported on 08/03/2022 12/24/20   Valentina Shaggy, MD  fluticasone-salmeterol (ADVAIR [facility]) [phone] MCG/ACT inhaler Inhale two puffs twice daily Patient not taking: Reported on 05/28/2022 12/24/20   Valentina Shaggy, MD  latanoprost (XALATAN) 0.005 % ophthalmic solution Place 1 drop into the left eye at bedtime. Patient not taking: Reported on 05/28/2022 11/25/21 11/25/22  Rankin, Clent Demark, MD  levocetirizine (XYZAL) 5 MG tablet Take 1 tablet (5 mg total) by mouth every evening. Patient not taking: Reported on 08/03/2022 04/07/22   Valentina Shaggy, MD  ofloxacin (OCUFLOX) 0.3 % ophthalmic solution Place 1 drop into the right eye 4 (four) times daily. Patient not taking: Reported on 08/03/2022 05/27/22   [provider]  prednisoLONE acetate (PRED FORTE) 1 % ophthalmic suspension Place 1 drop into the right eye 4 (four) times daily. Patient not taking: Reported on 08/03/2022 05/27/22   [provider]   Spacer/Aero-Holding Chambers (AEROCHAMBER PLUS) inhaler Use with inhaler 03/09/21   Melynda Ripple, MD     Vital Signs: BP (!) 128/36 (BP Location: Right Arm)   Pulse 72   Temp 97.7 F (36.5 C) (Axillary)   Resp 11   Ht '5\' 5"'$  (1.651 m)   Wt 127 lb 3.3 oz (57.7 kg)   SpO2 95%   BMI 21.17 kg/m   Physical Exam Constitutional:      General: She is not in acute distress. Pulmonary:     Effort: Pulmonary effort is normal.  Abdominal:     Comments: RLQ drain to gravity. Zero output in gravity bag. Purulent fluid in tubing. Dressing is clean/dry/intact.   Skin:    General: Skin is warm and dry.  Neurological:     Mental Status: She is alert. She is disoriented.     Imaging: IR Sinus/Fist Tube Chk-Non GI  Result Date: 08/12/2022 CLINICAL DATA:  72 year old female with history of right pelvic abscess status post percutaneous drain placement on 08/03/2022 presenting with feculent output from drainage catheter. EXAM: IR CATHETER TUBE CHANGE COMPARISON:  None Available. CONTRAST:  10 mL Omnipaque 300-administered via the existing percutaneous drain. FLUOROSCOPY TIME:  Two mGy TECHNIQUE: The patient was positioned supine on the fluoroscopy table. A preprocedural spot fluoroscopic image was obtained of the right lower quadrant and the existing percutaneous drainage catheter.  Multiple spot fluoroscopic and radiographic images were obtained following the injection of a small amount of contrast via the existing percutaneous drainage catheter. The drainage catheter was then placed to bag drainage. FINDINGS: Contrast injection demonstrates minimal right lower quadrant fluid collection with fistulous connection to what appears to be the appendix which eventually drains into the cecum. 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 No drain manipulation performed. PLAN: Drain was placed to bag drainage from bulb suction. Recommend discontinuation of  drain flushing. Follow-up in 3-4 weeks in [facility] with CT abdomen pelvis and fluoroscopic drain injection to assess for abscess/fistula resolution. Destiny Cancer, MD [facility] Radiology Electronically Signed   By: Destiny Day M.D.   On: 08/12/2022 10:19   CT ANGIO AO+BIFEM W & OR WO CONTRAST  Result Date: 08/10/2022 CLINICAL DATA:  72 year old with rest pain. EXAM: CT ANGIOGRAPHY OF ABDOMINAL AORTA WITH ILIOFEMORAL RUNOFF TECHNIQUE: Multidetector CT imaging of the abdomen, pelvis and lower extremities was performed using the standard protocol during bolus administration of intravenous contrast. Multiplanar CT image reconstructions and MIPs were obtained to evaluate the vascular anatomy. RADIATION DOSE REDUCTION: This exam was performed according to the departmental dose-optimization program which includes automated exposure control, adjustment of the mA and/or kV according to patient size and/or use of iterative reconstruction technique. CONTRAST:  135m OMNIPAQUE IOHEXOL 350 MG/ML SOLN COMPARISON:  CT abdomen and pelvis 08/06/2022 FINDINGS: VASCULAR Aorta: Diffuse circumferential calcifications involving the abdominal aorta without aneurysm or dissection. No significant aortic stenosis. Celiac: Celiac artery is patent with diffuse atherosclerotic calcifications. At least mild narrowing at the origin of the celiac trunk. No evidence for aneurysm or dissection involving the branch vessels. SMA: SMA is patent with at least moderate stenosis at the origin due to large amount of calcified plaque. Renals: Bilateral renal arteries are patent with diffuse atherosclerotic plaque. High-grade stenosis involving the right renal artery origin. At least mild to moderate stenosis in the proximal left renal artery. No aneurysm or dissection involving the renal arteries. IMA: Patent RIGHT Lower Extremity Inflow: Circumferential calcifications in the right common iliac  artery without significant stenosis. High-grade stenosis or occlusion at the origin of the right internal iliac artery. Diffuse calcified plaque throughout the right external iliac artery. Stenosis in the right external artery is likely hemodynamically significant with scattered areas of narrowing from calcified plaque. Outflow: Postsurgical changes in the right groin and the right common femoral artery is patent. Right profunda femoral arteries are calcified but patent. Native SFA is occluded. Right femoropopliteal bypass graft is tortuous but patent. Graft ties into the distal popliteal artery near the knee. There appears to be some intraluminal stenosis in the bypass graft. Limited evaluation of the distal graft due to motion artifact. Suspect that the distal graft is patent. Popliteal artery just distal to the graft is very limited due to motion artifact. Runoff: Limited evaluation of the runoff vessels due to diffuse calcifications. However, there appears to be flow in the posterior tibial artery and peroneal artery. Evidence for segmental disease throughout the anterior tibial artery and limited evaluation. LEFT Lower Extremity Inflow: Circumferential calcifications in left common iliac artery without significant stenosis. Probable high-grade stenosis at the origin left internal iliac artery. Narrowing in the mid and distal left external iliac artery from diffuse atherosclerotic disease. Outflow: Left common femoral artery is heavily calcified but patent. Origin of the left profunda femoral artery is heavily calcified. There is flow in the  left profunda femoral arteries. Native left SFA is occluded. Left femoropopliteal bypass graft is occluded. Runoff: Limited evaluation due to calcified vessels. There appears to be flow in the left peroneal artery. Occlusive disease in the anterior tibial artery. There appears to be occlusive or segmental disease in the posterior tibial artery. Veins: No obvious venous  abnormality within the limitations of this arterial phase study. Review of the MIP images confirms the above findings. NON-VASCULAR Lower chest: Bandlike atelectasis or scarring in the right lower lobe. Otherwise, the visualized lung bases are clear. Hepatobiliary: Evidence for cholelithiasis. Mild distention of the gallbladder is unchanged. Normal appearance of liver. Pancreas: Unremarkable. No pancreatic ductal dilatation or surrounding inflammatory changes. Spleen: Normal in size without focal abnormality. Adrenals/Urinary Tract: Adrenal glands are within normal limits. Atrophy in both kidneys compatible with history of end-stage renal disease. Evidence for bilateral renal cysts that to do not require dedicated follow-up. Urinary bladder is decompressed. Small amount of contrast within the urinary bladder. Stomach/Bowel: High-density material throughout the small and large bowel without dilatation or obstruction. Normal appearance of the stomach. Again noted is material just posterior to the rectum and near the tip of the coccyx on image 135/6. This material measures roughly 2.0 x 2.2 cm and minimally changed from the exam on 08/06/2022. Concern for increased densities in the posterior subcutaneous tissues along the left posterior aspect of the coccyx on image 124/6. Developing subcutaneous fistula tract cannot be excluded. Lymphatic: No significant lymph node enlargement in the abdomen or pelvis. Reproductive: Status post hysterectomy. No evidence for a left adnexa mass. Again noted is complex structure in the region of the right adnexa containing a small focus of gas and small calcifications. There is a percutaneous drain along the anterior aspect of this complex collection. Collection measures approximately 4.6 x 3.9 cm on image 115/6 and roughly measured 4.1 x 3.5 cm on the exam from 08/06/2022. Other: No free fluid.  Negative for free air. Musculoskeletal: Bilateral transmetatarsal amputations. No acute bone  abnormality. IMPRESSION: VASCULAR 1. Severe atherosclerotic disease throughout the abdomen, pelvis and bilateral lower extremities. Aortic Atherosclerosis (ICD10-I70.0). 2. Bilateral inflow disease. Stenosis involving bilateral external iliac arteries. 3. 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 4. Right femoropopliteal bypass graft is patent but limited evaluation due to motion artifact. 5. Very limited evaluation of the bilateral runoff vessels due to severe vascular calcifications. Likely single-vessel runoff on the left from the peroneal artery. At least 2 vessel runoff in the right lower extremity as described. 6. 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 7. Bilateral renal artery stenosis. NON-VASCULAR 1. Complex collection in the right hemipelvis has slightly enlarged in size since 08/06/2022. The anterior percutaneous drain is still in place and located along the anterior aspect of the collection. Slightly limited evaluation of this structure due to the phase of contrast. 2. Concern for fluid or material just posterior to the rectum and just inferior to the coccyx. There may also be material or fluid tracking in the posterior pelvic subcutaneous tissues near the coccyx. Recommend attention to this area on follow up imaging. 3. Cholelithiasis. Electronically Signed   By: Markus Daft M.D.   On: 08/10/2022 17:27   NM Hepatobiliary Liver Func  Result Date: 08/10/2022 CLINICAL DATA:  Concern for acute cholecystitis. EXAM: NUCLEAR MEDICINE HEPATOBILIARY IMAGING TECHNIQUE: Sequential images of the abdomen were obtained out to 60  minutes following intravenous administration of radiopharmaceutical. RADIOPHARMACEUTICALS:  5.2 mCi Tc-55m Choletec IV COMPARISON:  CT August 06, 2022. FINDINGS: Prompt uptake and biliary excretion of activity by the liver is seen.  Gallbladder activity is visualized, consistent with patency of cystic duct. Biliary activity passes into small bowel, consistent with patent common bile duct. IMPRESSION: Patent cystic and common bile ducts without scintigraphic evidence of acute cholecystitis. Electronically Signed   By: JDahlia BailiffM.D.   On: 08/10/2022 13:39   [place]KG SITE RITE  Result Date: 08/10/2022 If Site Rite image not attached, placement could not be confirmed due to current cardiac rhythm.   Labs:  CBC: Recent Labs    08/11/22 0612 08/12/22 1011 08/12/22 1432 08/13/22 0329  WBC 28.1* 24.5* 25.5* 27.8*  HGB 12.9 11.0* 10.4* 11.5*  HCT 39.7 32.5* 30.0* 33.9*  PLT 411* 306 334 347    COAGS: Recent Labs    08/03/22 0422  INR 1.0    BMP: Recent Labs    08/09/22 0502 08/10/22 0446 08/12/22 1432 08/13/22 0329  NA 132* 128* 122* 132*  K 3.0* 3.2* 2.5* 3.3*  CL 91* 90* 84* 93*  CO2 22 21* 21* 24  GLUCOSE 159* 190* 251* 148*  BUN 34* 41* 37* 11  CALCIUM 10.0 10.0 9.6 9.3  CREATININE 5.01* 6.06* 6.50* 2.92*  GFRNONAA 9* 7* 6* 17*    LIVER FUNCTION TESTS: Recent Labs    08/06/2022 1730 08/03/22 0422 08/04/22 0438 08/05/22 0403 08/07/22 0627 08/09/22 0502 08/12/22 1432 08/13/22 0329  BILITOT 0.6 0.6 0.6 0.5  --   --   --   --   AST '19 15 15 15  '$ --   --   --   --   ALT '9 9 10 10  '$ --   --   --   --   ALKPHOS 93 86 86 73  --   --   --   --   PROT 6.3* 5.9* 6.2* 6.0*  --   --   --   --   ALBUMIN 2.7* 2.3* 2.2* 2.2* 2.3* 2.3* 2.0* 2.1*    Assessment and Plan:  Perforated appendicitis with abscess s/p drain placement 08/03/22 in IR by Dr. WEarleen Newport   Afebrile but with worsening leukocytosis. No drain output since drain was exchanged/repositioned yesterday.   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 Suture and stat lock in place. Dressed appropriately.   Plan: Do not flush drain Record output Q shift. Dressing changes QD or PRN if soiled.   Discharge planning: Please contact IR APP or on call IR MD prior to patient d/c to ensure appropriate follow up plans are in place. We will plan to see the patient in approximately 3-4 weeks for drain evaluation, CT imaging and fluoroscopic drain injection.   IR will continue to follow - please call with questions or concerns.  Electronically Signed: JSoyla Dryer AGACNP-BC [phone]/18/2024, 12:55 PM   I spent a total  of 15 Minutes at the the patient's bedside AND on the patient's hospital floor or unit, greater than 50% of which was counseling/coordinating care for abscess drain.

## 2022-08-14 DIAGNOSIS — E440 Moderate protein-calorie malnutrition: Secondary | ICD-10-CM | POA: Insufficient documentation

## 2022-08-14 DIAGNOSIS — K651 Peritoneal abscess: Secondary | ICD-10-CM | POA: Diagnosis not present

## 2022-08-14 DIAGNOSIS — I10 Essential (primary) hypertension: Secondary | ICD-10-CM | POA: Diagnosis not present

## 2022-08-14 DIAGNOSIS — K659 Peritonitis, unspecified: Secondary | ICD-10-CM | POA: Diagnosis not present

## 2022-08-14 DIAGNOSIS — N186 End stage renal disease: Secondary | ICD-10-CM | POA: Diagnosis not present

## 2022-08-14 LAB — CBC
HCT: 31.8 % — ABNORMAL LOW (ref 36.0–46.0)
Hemoglobin: 10.9 g/dL — ABNORMAL LOW (ref 12.0–15.0)
MCH: 32.1 pg (ref 26.0–34.0)
MCHC: 34.3 g/dL (ref 30.0–36.0)
MCV: 93.5 fL (ref 80.0–100.0)
Platelets: 336 10*3/uL (ref 150–400)
RBC: 3.4 MIL/uL — ABNORMAL LOW (ref 3.87–5.11)
RDW: 13.4 % (ref 11.5–15.5)
WBC: 24.5 10*3/uL — ABNORMAL HIGH (ref 4.0–10.5)
nRBC: 0.1 % (ref 0.0–0.2)

## 2022-08-14 LAB — GLUCOSE, CAPILLARY
Glucose-Capillary: 178 mg/dL — ABNORMAL HIGH (ref 70–99)
Glucose-Capillary: 235 mg/dL — ABNORMAL HIGH (ref 70–99)
Glucose-Capillary: 128 mg/dL — ABNORMAL HIGH (ref 70–99)

## 2022-08-14 LAB — HEPARIN LEVEL (UNFRACTIONATED): Heparin Unfractionated: 0.49 IU/mL (ref 0.30–0.70)

## 2022-08-14 MED ORDER — LIDOCAINE HCL (PF) 1 % IJ SOLN: 5.0000 mL | INTRAMUSCULAR | Status: DC | PRN

## 2022-08-14 MED ORDER — HEPARIN SODIUM (PORCINE) 1000 UNIT/ML DIALYSIS
1000.0000 [IU] | INTRAMUSCULAR | Status: DC | PRN
  Filled 2022-08-14: qty 1

## 2022-08-14 MED ORDER — ANTICOAGULANT SODIUM CITRATE 4% (200MG/5ML) IV SOLN: 5.0000 mL | Status: DC | PRN

## 2022-08-14 MED ORDER — HEPARIN SODIUM (PORCINE) 1000 UNIT/ML IJ SOLN
INTRAMUSCULAR | Status: AC
  Administered 2022-08-14: 1000 [IU]
  Filled 2022-08-14: qty 3

## 2022-08-14 MED ORDER — ALTEPLASE 2 MG IJ SOLR: 2.0000 mg | Freq: Once | INTRAMUSCULAR | Status: DC | PRN

## 2022-08-14 MED ORDER — LIDOCAINE-PRILOCAINE 2.5-2.5 % EX CREA: 1.0000 | TOPICAL_CREAM | CUTANEOUS | Status: DC | PRN

## 2022-08-14 MED ORDER — HEPARIN SODIUM (PORCINE) 1000 UNIT/ML DIALYSIS
2500.0000 [IU] | Freq: Once | INTRAMUSCULAR | Status: AC
  Administered 2022-08-14: 2500 [IU] via INTRAVENOUS_CENTRAL

## 2022-08-14 MED ORDER — PENTAFLUOROPROP-TETRAFLUOROETH EX AERO: 1.0000 | INHALATION_SPRAY | CUTANEOUS | Status: DC | PRN

## 2022-08-14 NOTE — Progress Notes (Signed)
[place] for Heparin Indication:  Limb ischemia   Allergies  Allergen Reactions   Adhesive  [Tape] Hives   Lactose Intolerance (Gi) Diarrhea   Lisinopril Cough   Prednisone Swelling and Other (See Comments)    Excessive fluid buildup    Patient Measurements: Height: '5\' 5"'$  (165.1 cm) Weight: 57.7 kg (127 lb 3.3 oz) IBW/kg (Calculated) : 57  Vital Signs: Temp: 97.5 F (36.4 C) (01/19 0810) Temp Source: Oral (01/19 0810) BP: 156/53 (01/19 0810) Pulse Rate: 78 (01/19 0810)  Labs: Recent Labs    08/12/22 1011 08/12/22 1432 08/13/22 0329 08/14/22 0512  HGB 11.0* 10.4* 11.5* 10.9*  HCT 32.5* 30.0* 33.9* 31.8*  PLT 306 334 347 336  HEPARINUNFRC 0.50  --  0.53 0.49  CREATININE  --  6.50* 2.92*  --      Estimated Creatinine Clearance: 15.9 mL/min (A) (by C-G formula based on SCr of 2.92 mg/dL (H)).   Assessment: 72 year old female continues heparin for limb ischemia.  Planning to purse revascularization once pelvic abscess improved/resolved  Heparin level therapeutic   Goal of Therapy:  Heparin level 0.3-0.7 units/ml Monitor platelets by anticoagulation protocol: Yes   Plan:  Heparin at 750 units / hr Follow up AM labs  Thank you Anette Guarneri, PharmD 08/14/2022, 8:32 AM

## 2022-08-14 NOTE — Progress Notes (Signed)
  Daily Progress Note  Subjective: Resting comfortably. No complaints  Objective: Vitals:   08/14/22 0810 08/14/22 1259  BP: (!) 156/53 (!) 133/58  Pulse: 78 75  Resp: 18 16  Temp: (!) 97.5 F (36.4 C) 97.6 F (36.4 C)  SpO2: 96%     Physical Examination Drain in place.  Nonpalpable in the feet.  No wounds on the feet.   ASSESSMENT/PLAN:  Patient is a 72 year old female with multiple medical problems including pelvic abscess from perforated appendicitis currently with drain.  Critical limb ischemia with rest pain in the left leg.  Patient continues to have leukocytosis. Plan for repeat CT scan early next week. Chenae has a complicated case which I have discussed with my partners.  In the optimal setting, I would wait until complete resolution of the pelvic abscess prior to attempted endovascular or open interventions for left lower extremity critical limb ischemia with rest pain.  Should she have an improved CT scan early next week, with decreasing leukocytosis and functioning drain, I plan to discuss possible endovascular intervention as scheduling allows to define and possibly improve inflow in an effort to decrease rest pain.  In talking with general surgery, resolution of the appendiceal fistula will take several weeks.  I am concerned that Ozie could worsen to ulcerations on the feet and that time.  I do not think she is not a candidate for open bypass surgery until the abscess has resolved due to the need for PTFE.   Cassandria Santee MD MS [facility] [phone] 08/14/2022  1:25 PM

## 2022-08-14 NOTE — Progress Notes (Signed)
PROGRESS NOTE        PATIENT DETAILS Name: Destiny Day Age: 72 y.o. Sex: female Date of Birth: 09-10-1950 Admit Date: 08/13/2022 Admitting Physician Clance Boll, MD TZG:YFVCB, Rachel Moulds, NP  Brief Summary: Patient is a 72 y.o.  female with history of ESRD on HD MWF, chronic HFpEF, PAD-s/p bilateral TMA who presented with RLQ pain-found to have perforated appendicitis with abscess  Significant events: 1/07>> admitted TRH-RLQ pain-subsequently found to have perforated appendicitis with abscess.  Significant studies: 1/07>> RUQ ultrasound: Cholelithiasis with positive sonographic Murphy sign. 1/07>> CT abdomen/pelvis: Findings suspicious for perforated appendicitis with periappendiceal abscess 1/11>> CT abdomen/pelvis: Interval decrease in size of the right pelvic abscess-s/p percutaneous drainage. 1/15>> HIDA scan: No evidence of acute cholecystitis. 1/15>> CT angio abdominal aorta: Severe atherosclerotic disease throughout abdomen/pelvis and bilateral lower extremities.  Severe left outflow disease with occlusion of left femoral-popliteal bypass graft, occlusion of native left SFA.  Complex collection in the right hemipelvis-enlarged since 1/11 study.  Significant microbiology data: 1/08>> appendix abscess culture: E. coli/Klebsiella  Procedures: 1/08>> CT-guided drainage of right pelvic abscess 1/17>> drain check by IR  Consults: IR ID Vascular surgery General surgery Nephrology  Subjective: Some ongoing rest pain mostly in the left lower extremity.  No abdominal pain.  Objective: Vitals: Blood pressure (!) 156/53, pulse 78, temperature (!) 97.5 F (36.4 C), temperature source Oral, resp. rate 18, height '5\' 5"'$  (1.651 m), weight 57.7 kg, SpO2 96 %.   Exam: Gen Exam:Alert awake-not in any distress HEENT:atraumatic, normocephalic Chest: B/L clear to auscultation anteriorly CVS:S1S2 regular Abdomen:soft non tender, non  distended Extremities:no edema Neurology: Non focal Skin: no rash  Pertinent Labs/Radiology:    Latest Ref Rng & Units 08/14/2022    5:12 AM 08/13/2022    3:29 AM 08/12/2022    2:32 PM  CBC  WBC 4.0 - 10.5 K/uL 24.5  27.8  25.5   Hemoglobin 12.0 - 15.0 g/dL 10.9  11.5  10.4   Hematocrit 36.0 - 46.0 % 31.8  33.9  30.0   Platelets 150 - 400 K/uL 336  347  334     Lab Results  Component Value Date   NA 132 (L) 08/13/2022   K 3.3 (L) 08/13/2022   CL 93 (L) 08/13/2022   CO2 24 08/13/2022      Assessment/Plan: Perforated appendicitis with periappendiceal abscess-s/p CT-guided drain placed on 1/8 by IR 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 Plan is to repeat CT imaging early next week   Critical left limb ischemia with rest pain PAD-s/p left femoral-popliteal bypass graft- CT angio with occluded left femoral to below-knee popliteal artery bypass Remains on IV heparin Appreciate vascular surgery following-recommendations are to pursue revascularization once pelvic abscess has resolved.  ESRD on HD MWF Nephrology following and directing care  Acute metabolic encephalopathy Hospital delirium In the setting of acute illness/appendix abscess Improved with supportive care Maintain delirium precautions  HFrEF Euvolemic Volume removal with HD  HLD Continue statin  HTN BP better-amlodipine adjusted 1/18 Continue BiDil/Coreg.   DM-2 (A1c 6.2 on 1/8) CBGs stable with SSI  Recent Labs    08/13/22 1528 08/13/22 2141 08/14/22 0810  GLUCAP 148* 157* 178*    Hypokalemia Replete/recheck  Neuropathic pain Rest  pain associated PAD Neurontin  Anxiety Continue with BuSpar as needed  Asthma COPD overlap No wheezing Continue bronchodilators  Complex right ovarian mass Outpatient GYN  follow-up-patient/spouse aware of this recommendation-potential malignancy  Left upper pole renal lesion Concern for malignancy-needs outpatient follow-up with urology for further workup  Debility/deconditioning PT/OT eval Home health recommended  Nutrition Status: Nutrition Problem: Moderate Malnutrition Etiology: chronic illness Signs/Symptoms: severe muscle depletion, moderate fat depletion Interventions: Nepro shake, MVI, Liberalize Diet  BMI: Estimated body mass index is 21.17 kg/m as calculated from the following:   Height as of this encounter: '5\' 5"'$  (1.651 m).   Weight as of this encounter: 57.7 kg.   Code status:   Code Status: Full Code   DVT Prophylaxis: SCDs Start: 08/05/2022 2213 IV heparin   Family Communication: Son [phone]-left voicemail on 1/18   Disposition Plan: Status is: Inpatient Remains inpatient appropriate because: Severity of illness.   Planned Discharge Destination:Home health   Diet: Diet Order             Diet regular Room service appropriate? No; Fluid consistency: Thin; Fluid restriction: 1200 mL Fluid  Diet effective now                     Antimicrobial agents: Anti-infectives (From admission, onward)    Start     Dose/Rate Route Frequency Ordered Stop   08/10/22 1400  cefTRIAXone (ROCEPHIN) 2 g in sodium chloride 0.9 % 100 mL IVPB        2 g 200 mL/hr over 30 Minutes Intravenous Every 24 hours 08/10/22 1025     08/10/22 1030  metroNIDAZOLE (FLAGYL) tablet 500 mg        500 mg Oral Every 12 hours 08/10/22 1025     08/08/22 1545  piperacillin-tazobactam (ZOSYN) IVPB 2.25 g  Status:  Discontinued        2.25 g 100 mL/hr over 30 Minutes Intravenous Every 8 hours 08/08/22 1455 08/10/22 1025   08/08/22 1415  ceFAZolin (ANCEF) IVPB 1 g/50 mL premix  Status:  Discontinued        1 g 100 mL/hr over 30 Minutes Intravenous Daily 08/08/22 1327 08/08/22 1454   08/25/2022 2245  piperacillin-tazobactam (ZOSYN) IVPB 2.25 g   Status:  Discontinued        2.25 g 100 mL/hr over 30 Minutes Intravenous Every 8 hours 07/30/2022 2215 08/08/22 1327   07/28/2022 1900  piperacillin-tazobactam (ZOSYN) IVPB 3.375 g        3.375 g 100 mL/hr over 30 Minutes Intravenous  Once 07/29/2022 1853 08/23/2022 2049        MEDICATIONS: Scheduled Meds:  acidophilus  1 capsule Oral Daily   amLODipine  10 mg Oral QHS   atorvastatin  40 mg Oral QHS   carvedilol  12.5 mg Oral Q M,W,F-HD   carvedilol  12.5 mg Oral 2 times per day on Tue Thu Sat   Chlorhexidine Gluconate Cloth  6 each Topical Q0600   doxercalciferol  7 mcg Intravenous Q M,W,F-HD   feeding supplement (NEPRO CARB STEADY)  237 mL Oral BID BM   ferric citrate  420 mg Oral TID WC   fluticasone furoate-vilanterol  1 puff Inhalation Daily   gabapentin  100 mg Oral QHS   Gerhardt's butt cream   Topical TID   insulin aspart  0-6 Units Subcutaneous TID WC   isosorbide-hydrALAZINE  1 tablet Oral 2 times per day on Sun Tue Thu Sat   isosorbide-hydrALAZINE  1 tablet Oral  Q M,W,F-HD   metroNIDAZOLE  500 mg Oral Q12H   multivitamin  1 tablet Oral QHS   pantoprazole  40 mg Oral BID   potassium chloride  40 mEq Oral Once   sodium chloride flush  5 mL Intracatheter Q8H   ursodiol  300 mg Oral BID   Continuous Infusions:  cefTRIAXone (ROCEPHIN)  IV 2 g (08/13/22 1533)   heparin 750 Units/hr (08/13/22 1208)   PRN Meds:.albuterol, busPIRone, fluticasone, hydrALAZINE, ondansetron (ZOFRAN) IV, ondansetron, oxyCODONE, prochlorperazine, traZODone   I have personally reviewed following labs and imaging studies  LABORATORY DATA: CBC: Recent Labs  Lab 08/11/22 0612 08/12/22 1011 08/12/22 1432 08/13/22 0329 08/14/22 0512  WBC 28.1* 24.5* 25.5* 27.8* 24.5*  HGB 12.9 11.0* 10.4* 11.5* 10.9*  HCT 39.7 32.5* 30.0* 33.9* 31.8*  MCV 96.8 93.7 92.9 92.4 93.5  PLT 411* 306 334 347 336     Basic Metabolic Panel: Recent Labs  Lab 08/09/22 0502 08/10/22 0446 08/12/22 1432  08/13/22 0329  NA 132* 128* 122* 132*  K 3.0* 3.2* 2.5* 3.3*  CL 91* 90* 84* 93*  CO2 22 21* 21* 24  GLUCOSE 159* 190* 251* 148*  BUN 34* 41* 37* 11  CREATININE 5.01* 6.06* 6.50* 2.92*  CALCIUM 10.0 10.0 9.6 9.3  PHOS 4.1  --  4.4 2.6     GFR: Estimated Creatinine Clearance: 15.9 mL/min (A) (by C-G formula based on SCr of 2.92 mg/dL (H)).  Liver Function Tests: Recent Labs  Lab 08/09/22 0502 08/12/22 1432 08/13/22 0329  ALBUMIN 2.3* 2.0* 2.1*    No results for input(s): "LIPASE", "AMYLASE" in the last 168 hours. No results for input(s): "AMMONIA" in the last 168 hours.  Coagulation Profile: No results for input(s): "INR", "PROTIME" in the last 168 hours.  Cardiac Enzymes: No results for input(s): "CKTOTAL", "CKMB", "CKMBINDEX", "TROPONINI" in the last 168 hours.  BNP (last 3 results) No results for input(s): "PROBNP" in the last 8760 hours.  Lipid Profile: No results for input(s): "CHOL", "HDL", "LDLCALC", "TRIG", "CHOLHDL", "LDLDIRECT" in the last 72 hours.  Thyroid Function Tests: No results for input(s): "TSH", "T4TOTAL", "FREET4", "T3FREE", "THYROIDAB" in the last 72 hours.  Anemia Panel: No results for input(s): "VITAMINB12", "FOLATE", "FERRITIN", "TIBC", "IRON", "RETICCTPCT" in the last 72 hours.  Urine analysis:    Component Value Date/Time   COLORURINE YELLOW 07/07/2016 0914   APPEARANCEUR CLOUDY (A) 07/07/2016 0914   LABSPEC >1.030 (H) 07/07/2016 0914   PHURINE 5.5 07/07/2016 0914   GLUCOSEU NEGATIVE 07/07/2016 0914   HGBUR SMALL (A) 07/07/2016 0914   BILIRUBINUR SMALL (A) 07/07/2016 0914   KETONESUR 15 (A) 07/07/2016 0914   PROTEINUR >300 (A) 07/07/2016 0914   UROBILINOGEN 0.2 07/30/2014 2000   NITRITE NEGATIVE 07/07/2016 0914   LEUKOCYTESUR SMALL (A) 07/07/2016 0914    Sepsis Labs: Lactic Acid, Venous    Component Value Date/Time   LATICACIDVEN 1.2 08/10/2022 0724    MICROBIOLOGY: No results found for this or any previous visit (from  the past 240 hour(s)).   RADIOLOGY STUDIES/RESULTS: No results found.   LOS: 12 days   Oren Binet, MD  [facility]    To contact the attending provider between 7A-7P or the covering provider during after hours 7P-7A, please log into the web site [email or web address] and access using universal [place] password for that web site. If you do not have the password, please call the hospital operator.  08/14/2022, 10:15 AM

## 2022-08-14 NOTE — Progress Notes (Signed)
[facility] Progress Note   Subjective:   Patient seen and examined at bedside.  Recognized me but does not remember my name.  Reports intermittent confusion.  Pain in LLE is her biggest complaint today. Reports it has been going on for 2 weeks.  Appetite is poor.  Denies n/v.  Admits to diarrhea and stool incontinence during sleep.  Minimal abdominal pain.  Denies CP, SOB and fatigue.    Objective Vitals:   08/13/22 2202 08/14/22 0030 08/14/22 0433 08/14/22 0810  BP: (!) 169/63 (!) 123/50 (!) 129/50 (!) 156/53  Pulse:  80 77 78  Resp: '19 16 13 18  '$ Temp: 98 F (36.7 C)  98.6 F (37 C) (!) 97.5 F (36.4 C)  TempSrc: Axillary  Axillary Oral  SpO2:   95% 96%  Weight:      Height:       Physical Exam General:chronically ill appearing female in NAD Heart:RRR, no mrg Lungs:CTAB, nml WOB on RA Abdomen:soft, NT, drain in RLQ Extremities:compression socks/braces in place, trace edema Dialysis Access: LU AVG +b/t   Filed Weights   08/10/22 1036 08/12/22 1435 08/12/22 1851  Weight: 56.2 kg 57.7 kg 57.7 kg    Intake/Output Summary (Last 24 hours) at 08/14/2022 1234 Last data filed at 08/14/2022 0600 Gross per 24 hour  Intake 844.45 ml  Output --  Net 844.45 ml    Additional Objective Labs: Basic Metabolic Panel: Recent Labs  Lab 08/09/22 0502 08/10/22 0446 08/12/22 1432 08/13/22 0329  NA 132* 128* 122* 132*  K 3.0* 3.2* 2.5* 3.3*  CL 91* 90* 84* 93*  CO2 22 21* 21* 24  GLUCOSE 159* 190* 251* 148*  BUN 34* 41* 37* 11  CREATININE 5.01* 6.06* 6.50* 2.92*  CALCIUM 10.0 10.0 9.6 9.3  PHOS 4.1  --  4.4 2.6   Liver Function Tests: Recent Labs  Lab 08/09/22 0502 08/12/22 1432 08/13/22 0329  ALBUMIN 2.3* 2.0* 2.1*    CBC: Recent Labs  Lab 08/11/22 0612 08/12/22 1011 08/12/22 1432 08/13/22 0329 08/14/22 0512  WBC 28.1* 24.5* 25.5* 27.8* 24.5*  HGB 12.9 11.0* 10.4* 11.5* 10.9*  HCT 39.7 32.5* 30.0* 33.9* 31.8*  MCV 96.8 93.7 92.9 92.4 93.5  PLT  411* 306 334 347 336   CBG: Recent Labs  Lab 08/13/22 0808 08/13/22 1209 08/13/22 1528 08/13/22 2141 08/14/22 0810  GLUCAP 154* 137* 148* 157* 178*    Medications:  cefTRIAXone (ROCEPHIN)  IV 2 g (08/13/22 1533)   heparin 750 Units/hr (08/13/22 1208)    acidophilus  1 capsule Oral Daily   amLODipine  10 mg Oral QHS   atorvastatin  40 mg Oral QHS   carvedilol  12.5 mg Oral Q M,W,F-HD   carvedilol  12.5 mg Oral 2 times per day on Tue Thu Sat   Chlorhexidine Gluconate Cloth  6 each Topical Q0600   doxercalciferol  7 mcg Intravenous Q M,W,F-HD   feeding supplement (NEPRO CARB STEADY)  237 mL Oral BID BM   ferric citrate  420 mg Oral TID WC   fluticasone furoate-vilanterol  1 puff Inhalation Daily   gabapentin  100 mg Oral QHS   Gerhardt's butt cream   Topical TID   insulin aspart  0-6 Units Subcutaneous TID WC   isosorbide-hydrALAZINE  1 tablet Oral 2 times per day on Sun Tue Thu Sat   isosorbide-hydrALAZINE  1 tablet Oral Q M,W,F-HD   metroNIDAZOLE  500 mg Oral Q12H   multivitamin  1 tablet Oral QHS  pantoprazole  40 mg Oral BID   potassium chloride  40 mEq Oral Once   sodium chloride flush  5 mL Intracatheter Q8H   ursodiol  300 mg Oral BID    Dialysis Orders: [place] MWF  4h  400/600   57.4kg   2/3.5 bath LUA AVG  Heparin 2500+ 1030mdrun - last HD 1/5, post wt 57.3kg - hectorol 7 ug IV tiw - venofer 50 q week - mircera 30 ug q4 wks, last 12/8, due now   Assessment/Plan: Abd pain/ intra-abdominal infection or abscess/perforated appendicitis - per CT imaging. Gen surg and IR consulted, started on IV abx. IR placed drain in RLQ fluid collection. Fluid sent for culture, growing E coli/ Klebsiella. Bacteroides. Per pmd. 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 No urgent role for  surgery per notes. Plan for repeat scan early next week. Per surgery/IR.  PAD/critical limb ischemia w/rest pain - CTA on 1/15 w/occluded L fem-pop. On Heparin.  VVS consulting, plan for revascularization when infection improved d/t concern for seeding of bypass graft.  ESRD - on HD MWF. HD on schedule. Next HD 1/19-- 4K bath  HTN/ volume - Appears mostly euvolemic on exam.  Set UF goal 1-2L as tolerated. Bps have been high --Resumed home amlodipine, but tends to drop during HD.    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 On Auryxia when taking meals DM 2 - per pmd COPD/ asthma - no symptoms Encephalopahty: likely multifactorial.  Stopped gabapentin.  Per Primary.   LJen Mow PA-C [facility] 08/14/2022,12:34 PM  LOS: 12 days

## 2022-08-14 NOTE — Progress Notes (Signed)
Received patient in bed to unit.  Alert and oriented.  Informed consent signed and in chart.   Treatment initiated: 1555 Treatment completed: 1935  Patient tolerated well.  Transported back to the room  Alert, without acute distress.  Hand-off given to patient's nurse.   Access used: AVF Access issues: none  Total UF removed: 1L Medication(s) given:  Post HD VS: see table below Post HD weight: 54.7kg   08/14/22 1953  Vitals  BP (!) 167/75  MAP (mmHg) 98  BP Location Right Arm  BP Method Automatic  Patient Position (if appropriate) Lying  Pulse Rate 83  Pulse Rate Source Monitor  ECG Heart Rate 83  Resp 17  Oxygen Therapy  SpO2 96 %  O2 Device Room Air  Patient Activity (if Appropriate) In bed  Pulse Oximetry Type Continuous  During Treatment Monitoring  HD Safety Checks Performed Yes  Intra-Hemodialysis Comments Tolerated well  Post Treatment  Dialyzer Clearance Lightly streaked  Duration of HD Treatment -hour(s) 3.5 hour(s)  Hemodialysis Intake (mL) 0 mL  Liters Processed 84  Fluid Removed (mL) 1000 mL  Tolerated HD Treatment Yes  Post-Hemodialysis Comments v/s stable  AVG/AVF Arterial Site Held (minutes) 8 minutes  AVG/AVF Venous Site Held (minutes) 8 minutes  Fistula / Graft Left Upper arm Arteriovenous vein graft  Placement Date/Time: (c) 09/05/20 (c) 1259   Placed prior to admission: Yes  Orientation: Left  Access Location: Upper arm  Access Type: Arteriovenous vein graft  Site Condition No complications  Fistula / Graft Assessment Thrill;Present;Bruit  Status Deaccessed;[facility]

## 2022-08-14 NOTE — Progress Notes (Signed)
Occupational Therapy Treatment Patient Details Name: Destiny Day MRN: [id number] DOB: 06-12-1951 Today's Date: 08/14/2022   History of present illness 72 year old female admitted 1/7  to the hospital with few days of right lower quadrant abdominal pain.  Imaging in the ED concerning for perforated appendicitis with a periappendiceal abscess with probably secondary involvement of the previously noted adnexal mass or a tubo-ovarian abscess.  She was placed on antibiotics, general surgery was consulted and she was admitted to the hospital. JP drain right lower quadrant placed.  PMH: ESRD on MWF HD, asthma/COPD, diastolic CHF, HTN, HLD, DM2   OT comments  Patient with fair progress toward patient focused goals.  Patient continues to need Min to Mod A for transfers, but sat EOB and was able to place bilateral braces with Min A.  OT to continue efforts in the acute setting to address deficits and assist with the transition home with assist as needed.     Recommendations for follow up therapy are one component of a multi-disciplinary discharge planning process, led by the attending physician.  Recommendations may be updated based on patient status, additional functional criteria and insurance authorization.    Follow Up Recommendations  Home health OT     Assistance Recommended at Discharge Frequent or constant Supervision/Assistance  Patient can return home with the following  A lot of help with walking and/or transfers;A lot of help with bathing/dressing/bathroom;Assistance with cooking/housework;Direct supervision/assist for medications management;Direct supervision/assist for financial management;Assist for transportation;Help with stairs or ramp for entrance   Equipment Recommendations  None recommended by OT    Recommendations for Other Services      Precautions / Restrictions Precautions Precautions: Fall Precaution Comments: drain right lower quadrant Other Brace: Pt has bil  shoes with metal upright braces Restrictions Weight Bearing Restrictions: No       Mobility Bed Mobility Overal bed mobility: Needs Assistance Bed Mobility: Sidelying to Sit   Sidelying to sit: Mod assist            Transfers Overall transfer level: Needs assistance Equipment used: Rolling walker (2 wheels) Transfers: Sit to/from Stand, Bed to chair/wheelchair/BSC Sit to Stand: Mod assist, From elevated surface     Step pivot transfers: Min assist           Balance Overall balance assessment: Needs assistance Sitting-balance support: Bilateral upper extremity supported, Feet supported Sitting balance-Leahy Scale: Fair     Standing balance support: Bilateral upper extremity supported, Reliant on assistive device for balance Standing balance-Leahy Scale: Poor                             ADL either performed or assessed with clinical judgement   ADL   Eating/Feeding: Sitting;Set up   Grooming: Wash/dry hands;Sitting;Set up               Lower Body Dressing: Moderate assistance;Sit to/from stand                      Extremity/Trunk Assessment Upper Extremity Assessment Upper Extremity Assessment: Generalized weakness       Cervical / Trunk Assessment Cervical / Trunk Assessment: Kyphotic                      Cognition Arousal/Alertness: Awake/alert Behavior During Therapy: WFL for tasks assessed/performed Overall Cognitive Status: No family/caregiver present to determine baseline cognitive functioning  Pertinent Vitals/ Pain       Pain Assessment Pain Assessment: Faces Pain Location: LEs with touch Pain Descriptors / Indicators: Grimacing, Guarding Pain Intervention(s): Monitored during session                                                          Frequency  Min 2X/week        Progress Toward  Goals  OT Goals(current goals can now be found in the care plan section)  Progress towards OT goals: Progressing toward goals  Acute Rehab OT Goals OT Goal Formulation: With patient Time For Goal Achievement: 08/26/22 Potential to Achieve Goals: Good  Plan Discharge plan remains appropriate    Co-evaluation                 AM-PAC OT "6 Clicks" Daily Activity     Outcome Measure   Help from another person eating meals?: None Help from another person taking care of personal grooming?: None Help from another person toileting, which includes using toliet, bedpan, or urinal?: A Lot Help from another person bathing (including washing, rinsing, drying)?: A Lot Help from another person to put on and taking off regular upper body clothing?: A Little Help from another person to put on and taking off regular lower body clothing?: A Lot 6 Click Score: 17    End of Session Equipment Utilized During Treatment: Gait belt;Rolling walker (2 wheels)  OT Visit Diagnosis: Unsteadiness on feet (R26.81);Other abnormalities of gait and mobility (R26.89);Muscle weakness (generalized) (M62.81);Other symptoms and signs involving cognitive function   Activity Tolerance Patient tolerated treatment well   Patient Left in chair;with call bell/phone within reach;with chair alarm set   Nurse Communication          Time: [phone] OT Time Calculation (min): 19 min  Charges: OT General Charges $OT Visit: 1 Visit OT Treatments $Self Care/Home Management : 8-22 mins  08/14/2022  RP, OTR/L  Acute Rehabilitation Services  Office:  [phone]   Destiny Day 08/14/2022, 10:18 AM

## 2022-08-14 NOTE — Progress Notes (Signed)
   Subjective/Chief Complaint: No complaints Denies abdominal pain this morning   Objective: Vital signs in last 24 hours: Temp:  [97.5 F (36.4 C)-98.6 F (37 C)] 97.5 F (36.4 C) (01/19 0810) Pulse Rate:  [72-84] 78 (01/19 0810) Resp:  [11-19] 18 (01/19 0810) BP: (107-169)/(36-63) 156/53 (01/19 0810) SpO2:  [91 %-96 %] 96 % (01/19 0810) Last BM Date : 08/13/22  Intake/Output from previous day: 01/18 0701 - 01/19 0700 In: 844.5 [I.V.:444.5; IV Piggyback:400] Out: -  Intake/Output this shift: No intake/output data recorded.  Exam: Awake and alert Abdomen soft, non-tender today Drain with some purulence in the tube  Lab Results:  Recent Labs    08/13/22 0329 08/14/22 0512  WBC 27.8* 24.5*  HGB 11.5* 10.9*  HCT 33.9* 31.8*  PLT 347 336   BMET Recent Labs    08/12/22 1432 08/13/22 0329  NA 122* 132*  K 2.5* 3.3*  CL 84* 93*  CO2 21* 24  GLUCOSE 251* 148*  BUN 37* 11  CREATININE 6.50* 2.92*  CALCIUM 9.6 9.3   PT/INR No results for input(s): "LABPROT", "INR" in the last 72 hours. ABG No results for input(s): "PHART", "HCO3" in the last 72 hours.  Invalid input(s): "PCO2", "PO2"  Studies/Results: No results found.  Anti-infectives: Anti-infectives (From admission, onward)    Start     Dose/Rate Route Frequency Ordered Stop   08/10/22 1400  cefTRIAXone (ROCEPHIN) 2 g in sodium chloride 0.9 % 100 mL IVPB        2 g 200 mL/hr over 30 Minutes Intravenous Every 24 hours 08/10/22 1025     08/10/22 1030  metroNIDAZOLE (FLAGYL) tablet 500 mg        500 mg Oral Every 12 hours 08/10/22 1025     08/08/22 1545  piperacillin-tazobactam (ZOSYN) IVPB 2.25 g  Status:  Discontinued        2.25 g 100 mL/hr over 30 Minutes Intravenous Every 8 hours 08/08/22 1455 08/10/22 1025   08/08/22 1415  ceFAZolin (ANCEF) IVPB 1 g/50 mL premix  Status:  Discontinued        1 g 100 mL/hr over 30 Minutes Intravenous Daily 08/08/22 1327 08/08/22 1454   08/01/2022 2245   piperacillin-tazobactam (ZOSYN) IVPB 2.25 g  Status:  Discontinued        2.25 g 100 mL/hr over 30 Minutes Intravenous Every 8 hours 08/08/2022 2215 08/08/22 1327   08/11/2022 1900  piperacillin-tazobactam (ZOSYN) IVPB 3.375 g        3.375 g 100 mL/hr over 30 Minutes Intravenous  Once 08/10/2022 1853 07/30/2022 2049       Assessment/Plan: Suspected perforated appendicitis with pelvic abscess - S/p IR drain placement - WBC 27 from 24, unclear etiology but I do feel the patient has adequate source control with IR drain in place next to the appendix. No evidence of perianal infection. - continue abx per ID. No urgent role for surgery. Hopefully fistula will close over time with drain in place  -ok from a general surgery standpoint for Vascular Surgery to do an A-gram and stents at their discretion.  Issue will remain when to address the perforated appendix and abscess in light of her need for an eventual lower extremity bypass which would likely require PTFE  -will repeat the CT scan Monday or Tuesday to evaluate progress of the abscess  General surgery will see PRN this weekend  LOS: 12 days    Destiny Day 08/14/2022

## 2022-08-15 DIAGNOSIS — E440 Moderate protein-calorie malnutrition: Secondary | ICD-10-CM

## 2022-08-15 DIAGNOSIS — K651 Peritoneal abscess: Secondary | ICD-10-CM | POA: Diagnosis not present

## 2022-08-15 DIAGNOSIS — M79606 Pain in leg, unspecified: Secondary | ICD-10-CM | POA: Diagnosis not present

## 2022-08-15 DIAGNOSIS — K659 Peritonitis, unspecified: Secondary | ICD-10-CM | POA: Diagnosis not present

## 2022-08-15 LAB — CBC
HCT: 34.2 % — ABNORMAL LOW (ref 36.0–46.0)
Hemoglobin: 11.7 g/dL — ABNORMAL LOW (ref 12.0–15.0)
MCH: 32.1 pg (ref 26.0–34.0)
MCHC: 34.2 g/dL (ref 30.0–36.0)
MCV: 94 fL (ref 80.0–100.0)
Platelets: 365 10*3/uL (ref 150–400)
RBC: 3.64 MIL/uL — ABNORMAL LOW (ref 3.87–5.11)
RDW: 13.6 % (ref 11.5–15.5)
WBC: 28.5 10*3/uL — ABNORMAL HIGH (ref 4.0–10.5)
nRBC: 0.1 % (ref 0.0–0.2)

## 2022-08-15 LAB — GLUCOSE, CAPILLARY
Glucose-Capillary: 159 mg/dL — ABNORMAL HIGH (ref 70–99)
Glucose-Capillary: 279 mg/dL — ABNORMAL HIGH (ref 70–99)
Glucose-Capillary: 132 mg/dL — ABNORMAL HIGH (ref 70–99)
Glucose-Capillary: 120 mg/dL — ABNORMAL HIGH (ref 70–99)

## 2022-08-15 LAB — HEPARIN LEVEL (UNFRACTIONATED): Heparin Unfractionated: 0.45 IU/mL (ref 0.30–0.70)

## 2022-08-15 NOTE — Progress Notes (Addendum)
[facility] Progress Note   Subjective:   Patient seen and examined in room.  Sitting in bedside chair.  About to transfer back into bed.  Reports abdominal pain earlier today but has now resolved.  Oriented but more lethargic/speech slowed compared to yesterday. Denies CP, SOB, nausea and vomiting.    Objective Vitals:   08/15/22 0400 08/15/22 0512 08/15/22 0838 08/15/22 0900  BP: (!) 194/58 (!) 165/66  138/80  Pulse: 84 81 84 84  Resp: '18 13 18 16  '$ Temp: 98 F (36.7 C)     TempSrc: Oral     SpO2: 98% 99% 93%   Weight:      Height:       Physical Exam General:chronically ill appearing, lethargic female in NAD Heart:RRR, no mrg Lungs:CTAB, nml WOB Abdomen:drain in RLQ Extremities:compression socks/braces in place, trace edema Dialysis Access: LU AVG +b/t   Filed Weights   08/12/22 1851 08/14/22 1530 08/14/22 1853  Weight: 57.7 kg 57.2 kg 54.7 kg    Intake/Output Summary (Last 24 hours) at 08/15/2022 1244 Last data filed at 08/15/2022 0838 Gross per 24 hour  Intake --  Output 1000 ml  Net -1000 ml    Additional Objective Labs: Basic Metabolic Panel: Recent Labs  Lab 08/09/22 0502 08/10/22 0446 08/12/22 1432 08/13/22 0329  NA 132* 128* 122* 132*  K 3.0* 3.2* 2.5* 3.3*  CL 91* 90* 84* 93*  CO2 22 21* 21* 24  GLUCOSE 159* 190* 251* 148*  BUN 34* 41* 37* 11  CREATININE 5.01* 6.06* 6.50* 2.92*  CALCIUM 10.0 10.0 9.6 9.3  PHOS 4.1  --  4.4 2.6   Liver Function Tests: Recent Labs  Lab 08/09/22 0502 08/12/22 1432 08/13/22 0329  ALBUMIN 2.3* 2.0* 2.1*   CBC: Recent Labs  Lab 08/12/22 1011 08/12/22 1432 08/13/22 0329 08/14/22 0512 08/15/22 0457  WBC 24.5* 25.5* 27.8* 24.5* 28.5*  HGB 11.0* 10.4* 11.5* 10.9* 11.7*  HCT 32.5* 30.0* 33.9* 31.8* 34.2*  MCV 93.7 92.9 92.4 93.5 94.0  PLT 306 334 347 336 365   Blood Culture    Component Value Date/Time   SDES WOUND 08/03/2022 1326   SPECREQUEST  ABCESS ABDOMEN 08/03/2022 1326   CULT   08/03/2022 1326    ABUNDANT ESCHERICHIA COLI MODERATE KLEBSIELLA PNEUMONIAE ABUNDANT BACTEROIDES THETAIOTAOMICRON BETA LACTAMASE POSITIVE Performed at [facility], [place] [address], [place], [place] [address]    REPTSTATUS 08/08/2022 FINAL 08/03/2022 1326    CBG: Recent Labs  Lab 08/14/22 0810 08/14/22 1251 08/14/22 2143 08/15/22 0839 08/15/22 1224  GLUCAP 178* 235* 128* 159* 279*   Medications:  cefTRIAXone (ROCEPHIN)  IV 2 g (08/15/22 1242)   heparin 750 Units/hr (08/14/22 2333)    acidophilus  1 capsule Oral Daily   amLODipine  10 mg Oral QHS   atorvastatin  40 mg Oral QHS   carvedilol  12.5 mg Oral Q M,W,F-HD   carvedilol  12.5 mg Oral 2 times per day on Tue Thu Sat   Chlorhexidine Gluconate Cloth  6 each Topical Q0600   doxercalciferol  7 mcg Intravenous Q M,W,F-HD   feeding supplement (NEPRO CARB STEADY)  237 mL Oral BID BM   ferric citrate  420 mg Oral TID WC   fluticasone furoate-vilanterol  1 puff Inhalation Daily   gabapentin  100 mg Oral QHS   Gerhardt's butt cream   Topical TID   insulin aspart  0-6 Units Subcutaneous TID WC   isosorbide-hydrALAZINE  1 tablet Oral 2 times  per day on Sun Tue Thu Sat   isosorbide-hydrALAZINE  1 tablet Oral Q M,W,F-HD   metroNIDAZOLE  500 mg Oral Q12H   multivitamin  1 tablet Oral QHS   pantoprazole  40 mg Oral BID   potassium chloride  40 mEq Oral Once   sodium chloride flush  5 mL Intracatheter Q8H   ursodiol  300 mg Oral BID    Dialysis Orders: [facility] MWF  4h  400/600   57.4kg   2/3.5 bath LUA AVG  Heparin 2500+ 1019mdrun - last HD 1/5, post wt 57.3kg - hectorol 7 ug IV tiw - venofer 50 q week - mircera 30 ug q4 wks, last 12/8, due now   Assessment/Plan: Abd pain/ intra-abdominal infection or abscess/perforated appendicitis - per CT imaging. Gen surg and IR consulted, started on IV abx. IR placed drain in RLQ fluid collection. Fluid sent for culture, growing E coli/ Klebsiella. Bacteroides. Per pmd. 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 No urgent role for surgery per notes. Plan for repeat scan early next week. Per surgery/IR.  PAD/critical limb ischemia w/rest pain - CTA on 1/15 w/occluded L fem-pop. On Heparin.  VVS consulting, if improvement noted in abscess on repeat CT, may proceed with endovascular intervention d/t concern for compromise of LE and expected long recovery time for abdominal abscess.   ESRD - on HD MWF. HD on schedule. Next HD 1/22-- 4K bath  HTN/ volume - Appears mostly euvolemic on exam.  Set UF goal 1-2L as tolerated. Bps have been high --Improved with resumed home amlodipine, carvedilol and bidil.  Hold pre HD d/t hypotension.  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 On Auryxia when taking meals DM 2 - per pmd COPD/ asthma - no symptoms Encephalopahty: likely multifactorial.  Waxing and wanning. Stopped gabapentin.  Per Primary.  Nutrition - Alb low. Currently on regular diet.  Follow labs. If K^ will need to change to renal diet. Add protein supplements.   LJen Mow PA-C [facility] 08/15/2022,12:44 PM  LOS: 13 days

## 2022-08-15 NOTE — Progress Notes (Signed)
[facility] for Heparin Indication:  Limb ischemia   Allergies  Allergen Reactions   Adhesive  [Tape] Hives   Lactose Intolerance (Gi) Diarrhea   Lisinopril Cough   Prednisone Swelling and Other (See Comments)    Excessive fluid buildup    Patient Measurements: Height: '5\' 5"'$  (165.1 cm) Weight: 54.7 kg (120 lb 9.5 oz) IBW/kg (Calculated) : 57  Vital Signs: Temp: 98 F (36.7 C) (01/20 0400) Temp Source: Oral (01/20 0400) BP: 165/66 (01/20 0512) Pulse Rate: 81 (01/20 0512)  Labs: Recent Labs    08/12/22 1432 08/13/22 0329 08/14/22 0512 08/15/22 0457  HGB 10.4* 11.5* 10.9* 11.7*  HCT 30.0* 33.9* 31.8* 34.2*  PLT 334 347 336 365  HEPARINUNFRC  --  0.53 0.49 0.45  CREATININE 6.50* 2.92*  --   --      Estimated Creatinine Clearance: 15.3 mL/min (A) (by C-G formula based on SCr of 2.92 mg/dL (H)).   Assessment: 72 year old female continues heparin for limb ischemia.  Planning to pursue revascularization once pelvic abscess improved/resolved  Heparin level therapeutic at 0.45 this am, communicated with today's RN to reach out to pharmacist with any concerns.   Goal of Therapy:  Heparin level 0.3-0.7 units/ml Monitor platelets by anticoagulation protocol: Yes   Plan:  Continue Heparin at 750 units / hr Continue daily levels  Monitor for signs and symptoms of bleeding.   Thank you Vicenta Dunning, PharmD  PGY1 Pharmacy Resident

## 2022-08-15 NOTE — Evaluation (Signed)
Clinical/Bedside Swallow Evaluation Patient Details  Name: Destiny Day MRN: [id number] Date of Birth: [birth year]  Today's Date: 08/15/2022 Time: SLP Start Time (ACUTE ONLY): 6712 SLP Stop Time (ACUTE ONLY): 0945 SLP Time Calculation (min) (ACUTE ONLY): 22 min  Past Medical History:  Past Medical History:  Diagnosis Date   Abdominal bruit    Anxiety    Arthritis    Osteoarthritis   Asthma    Cervical disc disease    "pinced nerve"   CHF (congestive heart failure) (HCC)    COPD (chronic obstructive pulmonary disease) ([place])    Diabetes mellitus    Type II   Diverticulitis    ESRD (end stage renal disease) ([place])    dialysis - M/W/F- [place]   GERD (gastroesophageal reflux disease)    from medications   GI bleed 03/31/2013   Head injury 07/2017   History of hiatal hernia    Hyperlipidemia    Hypertension    Neuropathy    left leg   Nuclear sclerotic cataract of right eye 04/23/2020   Osteoporosis    Peripheral vascular disease ([place])    Pneumonia    "very young" and a few years ago   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)    Vitrectomy membrane peel endolaser 05-07-2021   Seasonal allergies    Shortness of breath dyspnea    WIth exertion, when fluid builds   Sleep apnea    can't afford cpap    Vitreomacular traction syndrome, 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   05-07-2021  Vitrectomy, membrane peel, PRP, resection posterior segment of NVD, remnants of peripheral NVE remain in place due to atrophic retina, no vitreous substitute   Past Surgical History:  Past Surgical History:  Procedure Laterality Date   A/V SHUNTOGRAM N/A 09/22/2016   Procedure: A/V Shuntogram - left arm;  Surgeon: Serafina Mitchell, MD;  Location: [facility];  Service: Cardiovascular;  Laterality: N/A;   A/V SHUNTOGRAM N/A 03/22/2018   Procedure: A/V SHUNTOGRAM - left arm;  Surgeon: Serafina Mitchell, MD;  Location: [facility];   Service: Cardiovascular;  Laterality: N/A;   A/V SHUNTOGRAM Left 11/17/2018   Procedure: A/V SHUNTOGRAM;  Surgeon: Angelia Mould, MD;  Location: [place] [facility];  Service: Cardiovascular;  Laterality: Left;   ABDOMINAL HYSTERECTOMY  1993`   AMPUTATION Left 09/01/2016   Procedure: LEFT FOOT TRANSMETATARSAL AMPUTATION;  Surgeon: Newt Minion, MD;  Location: [place] [facility];  Service: Orthopedics;  Laterality: Left;   AMPUTATION Right 08/11/2017   Procedure: RIGHT GREAT TOE AMPUTATION DIGIT;  Surgeon: Rosetta Posner, MD;  Location: [place];  Service: Vascular;  Laterality: Right;   AMPUTATION Right 12/31/2017   Procedure: RIGHT TRANSMETATARSAL AMPUTATION;  Surgeon: Newt Minion, MD;  Location: [facility];  Service: Orthopedics;  Laterality: Right;   AV FISTULA PLACEMENT Left 04/21/2016   Procedure: INSERTION OF ARTERIOVENOUS (AV) GORE-TEX GRAFT ARM LEFT;  Surgeon: Elam Dutch, MD;  Location: [place];  Service: Vascular;  Laterality: Left;   BASCILIC VEIN TRANSPOSITION Left 07/10/2014   Procedure: BASCILIC VEIN TRANSPOSITION;  Surgeon: Angelia Mould, MD;  Location: [place];  Service: Vascular;  Laterality: Left;   [place] Right 11/08/2014   Procedure: FIRST STAGE BASILIC VEIN TRANSPOSITION;  Surgeon: Angelia Mould, MD;  Location: [place];  Service: Vascular;  Laterality: Right;   [place] Right 01/18/2015   Procedure: SECOND STAGE BASILIC VEIN TRANSPOSITION;  Surgeon:  Angelia Mould, MD;  Location: [place];  Service: Vascular;  Laterality: Right;   CRANIOTOMY N/A 08/23/2017   Procedure: CRANIOTOMY HEMATOMA EVACUATION SUBDURAL;  Surgeon: Ashok Pall, MD;  Location: [place];  Service: Neurosurgery;  Laterality: N/A;   CRANIOTOMY Left 08/24/2017   Procedure: CRANIOTOMY FOR RECURRENT ACUTE SUBDURAL HEMATOMA;  Surgeon: Ashok Pall, MD;  Location: [facility];  Service: Neurosurgery;  Laterality: Left;   ESOPHAGOGASTRODUODENOSCOPY N/A 03/31/2013   Procedure:  ESOPHAGOGASTRODUODENOSCOPY (EGD);  Surgeon: Gatha Mayer, MD;  Location: [facility];  Service: Endoscopy;  Laterality: N/A;   EYE SURGERY     laser surgery   FEMORAL-POPLITEAL BYPASS GRAFT Left 07/08/2016   Procedure: LEFT  FEMORAL-BELOW KNEE POPLITEAL ARTERY BYPASS GRAFT USING 6MM X 80 CM PROPATEN GORETEX GRAFT WITH RINGS.;  Surgeon: Rosetta Posner, MD;  Location: [facility] OR;  Service: Vascular;  Laterality: Left;   FEMORAL-POPLITEAL BYPASS GRAFT Right 08/11/2017   Procedure: RIGHT FEMORAL TO BELOW KNEE POPLITEAL ARTERKY  BYPASS GRAFT USING 6MM RINGED PROPATEN GRAFT;  Surgeon: Rosetta Posner, MD;  Location: [place];  Service: Vascular;  Laterality: Right;   FISTULOGRAM Left 10/29/2014   Procedure: FISTULOGRAM;  Surgeon: Angelia Mould, MD;  Location: [facility];  Service: Cardiovascular;  Laterality: Left;   GAS/FLUID EXCHANGE Left 12/20/2018   Procedure: AIR GAS EXCHANGE;  Surgeon: Jalene Mullet, MD;  Location: [place];  Service: Ophthalmology;  Laterality: Left;   INSERTION OF DIALYSIS CATHETER N/A 09/05/2020   Procedure: INSERTION OF DIALYSIS CATHETER;  Surgeon: Cherre Robins, MD;  Location: [facility];  Service: Vascular;  Laterality: N/A;   INSERTION OF DIALYSIS CATHETER Left 03/13/2021   Procedure: INSERTION OF DIALYSIS CATHETER;  Surgeon: Waynetta Sandy, MD;  Location: [place];  Service: Vascular;  Laterality: Left;   IR SINUS/FIST TUBE CHK-NON GI  08/12/2022   KNEE ARTHROSCOPY Right 05/06/2018   Procedure: RIGHT KNEE ARTHROSCOPY;  Surgeon: Newt Minion, MD;  Location: [place];  Service: Orthopedics;  Laterality: Right;   KNEE ARTHROSCOPY Right 06/10/2018   Procedure: RIGHT KNEE ARTHROSCOPY AND DEBRIDEMENT;  Surgeon: Newt Minion, MD;  Location: [place];  Service: Orthopedics;  Laterality: Right;   LIGATION OF ARTERIOVENOUS  FISTULA Left 04/21/2016   Procedure: LIGATION OF ARTERIOVENOUS  FISTULA LEFT ARM;  Surgeon: Elam Dutch, MD;  Location: [place];  Service: Vascular;  Laterality:  Left;   LOWER EXTREMITY ANGIOGRAPHY N/A 04/06/2017   Procedure: Lower Extremity Angiography - Right;  Surgeon: Serafina Mitchell, MD;  Location: [place] [facility];  Service: Cardiovascular;  Laterality: N/A;   MEMBRANE PEEL Left 12/20/2018   Procedure: MEMBRANE PEEL;  Surgeon: Jalene Mullet, MD;  Location: [place];  Service: Ophthalmology;  Laterality: Left;   MEMBRANE PEEL Right 05/29/2022   Procedure: MEMBRANE PEEL;  Surgeon: Hurman Horn, MD;  Location: [place];  Service: Ophthalmology;  Laterality: Right;   PARS PLANA VITRECTOMY Right 05/29/2022   Procedure: PARS PLANA VITRECTOMY WITH 25 GAUGE;  Surgeon: Hurman Horn, MD;  Location: [place];  Service: Ophthalmology;  Laterality: Right;   PATCH ANGIOPLASTY Right 01/18/2015   Procedure: BASILIC VEIN PATCH ANGIOPLASTY USING VASCUGUARD PATCH;  Surgeon: Angelia Mould, MD;  Location: [place];  Service: Vascular;  Laterality: Right;   PERIPHERAL VASCULAR BALLOON ANGIOPLASTY  09/22/2016   Procedure: Peripheral Vascular Balloon Angioplasty;  Surgeon: Serafina Mitchell, MD;  Location: [place] [facility];  Service: Cardiovascular;;  Lt. Fistula   PERIPHERAL VASCULAR BALLOON ANGIOPLASTY Left  03/22/2018   Procedure: PERIPHERAL VASCULAR BALLOON ANGIOPLASTY;  Surgeon: Serafina Mitchell, MD;  Location: [place] [facility];  Service: Cardiovascular;  Laterality: Left;  Arm shunt   PERIPHERAL VASCULAR CATHETERIZATION N/A 06/23/2016   Procedure: Abdominal Aortogram w/Lower Extremity;  Surgeon: Serafina Mitchell, MD;  Location: [place] [facility];  Service: Cardiovascular;  Laterality: N/A;   PERIPHERAL VASCULAR CATHETERIZATION  06/23/2016   Procedure: Peripheral Vascular Intervention;  Surgeon: Serafina Mitchell, MD;  Location: [place] [facility];  Service: Cardiovascular;;  lt common and external illiac artery   PHOTOCOAGULATION WITH LASER Left 12/20/2018   Procedure: PHOTOCOAGULATION WITH LASER;  Surgeon: Jalene Mullet, MD;  Location: [place];  Service: Ophthalmology;   Laterality: Left;   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   Procedure: REPAIR OF COMPLEX TRACTION RETINAL DETACHMENT;  Surgeon: Jalene Mullet, MD;  Location: [place];  Service: Ophthalmology;  Laterality: Left;   REVISION OF ARTERIOVENOUS GORETEX GRAFT Left 09/05/2020   Procedure: REVISION OF ARTERIOVENOUS GORETEX GRAFT LEFT;  Surgeon: Cherre Robins, MD;  Location: [facility] OR;  Service: Vascular;  Laterality: Left;   HPI:  Patient is a 72 y.o.  female with history of ESRD on HD MWF, chronic HFpEF, PAD-s/p bilateral TMA who presented with RLQ pain-found to have perforated appendicitis with abscess    Swallow eval ordered, pt denies dysphagia.    Assessment / Plan / Recommendation  Clinical Impression  Patient presents with clinical indications of minimal oral dysphagia likely due to her lack of dentition, current cognitive difficulties *delayed processing, word finding* resulting in prolonged mastication and oral retention on the right lateral sulci.  Pt also was consuming dry french toast - - Use of liquids did not clear solid retention, therefore SLP cued her to consume applesauce, which was effective to clear 90%.  No indication of aspiration across all po trials including coffee, water, french toast, sausage. Advised pt and her daughter *arrived during session* to recommendations for compensations and means to take medications if difficult for her to swallow using teach back. Recommend continue diet - encouraging softer foods or using purees to clear, etc.  Thanks for this consult. SLP Visit Diagnosis: Dysphagia, oral phase (R13.11)    Aspiration Risk  No limitations    Diet Recommendation Regular;Thin liquid   Liquid Administration via: Cup;Straw Medication Administration: Whole meds with liquid Supervision: Patient able to self feed Compensations: Slow rate;Small sips/bites;Lingual sweep for clearance of pocketing Postural Changes: Seated upright at 90 degrees;Remain  upright for at least 30 minutes after po intake    Other  Recommendations Oral Care Recommendations: Oral care BID    Recommendations for follow up therapy are one component of a multi-disciplinary discharge planning process, led by the attending physician.  Recommendations may be updated based on patient status, additional functional criteria and insurance authorization.  Follow up Recommendations No SLP follow up      Assistance Recommended at Discharge    Functional Status Assessment Patient has not had a recent decline in their functional status  Frequency and Duration            Prognosis        Swallow Study   General Date of Onset: 08/15/22 HPI: Patient is a 72 y.o.  female with history of ESRD on HD MWF, chronic HFpEF, PAD-s/p bilateral TMA who presented with RLQ pain-found to have perforated appendicitis with abscess    Swallow eval ordered, pt denies dysphagia. Type of Study: Bedside Swallow Evaluation  Diet Prior to this Study: Regular;Thin liquids (fluid restriction) Temperature Spikes Noted: No Respiratory Status: Room air History of Recent Intubation: No Behavior/Cognition: Alert;Cooperative;Pleasant mood Oral Cavity Assessment: Within Functional Limits Oral Care Completed by SLP: No Oral Cavity - Dentition: Missing dentition (few teeth present) Vision: Functional for self-feeding Self-Feeding Abilities: Able to feed self Patient Positioning: Upright in chair Baseline Vocal Quality: Low vocal intensity Volitional Cough: Other (Comment) (did not test due to her abdomen pain with coughing) Volitional Swallow: Able to elicit    Oral/Motor/Sensory Function Overall Oral Motor/Sensory Function: Within functional limits   Ice Chips Ice chips: Not tested   Thin Liquid Thin Liquid: Within functional limits Presentation: Cup;Self Fed    Nectar Thick Nectar Thick Liquid: Not tested   Honey Thick Honey Thick Liquid: Not tested   Puree Puree: Within functional  limits Presentation: Self Fed;Spoon   Solid     Solid: Impaired Presentation: Self Fed Oral Phase Functional Implications: Right lateral sulci pocketing;Impaired mastication;Oral residue;Other (comment) (prolonged mastication - with oral pocketing on the right)      Macario Golds 08/15/2022,11:31 AM Kathleen Lime, MS [place] Office [phone]

## 2022-08-15 NOTE — Progress Notes (Signed)
PROGRESS NOTE        PATIENT DETAILS Name: Destiny Day Age: 72 y.o. Sex: female Date of Birth: [birth year] Admit Date: 08/18/2022 Admitting Physician Clance Boll, MD FVC:BSWHQ, Rachel Moulds, NP  Brief Summary: Patient is a 72 y.o.  female with history of ESRD on HD MWF, chronic HFpEF, PAD-s/p bilateral TMA who presented with RLQ pain-found to have perforated appendicitis with abscess  Significant events: 1/07>> admitted TRH-RLQ pain-subsequently found to have perforated appendicitis with abscess.  Significant studies: 1/07>> RUQ ultrasound: Cholelithiasis with positive sonographic Murphy sign. 1/07>> CT abdomen/pelvis: Findings suspicious for perforated appendicitis with periappendiceal abscess 1/11>> CT abdomen/pelvis: Interval decrease in size of the right pelvic abscess-s/p percutaneous drainage. 1/15>> HIDA scan: No evidence of acute cholecystitis. 1/15>> CT angio abdominal aorta: Severe atherosclerotic disease throughout abdomen/pelvis and bilateral lower extremities.  Severe left outflow disease with occlusion of left femoral-popliteal bypass graft, occlusion of native left SFA.  Complex collection in the right hemipelvis-enlarged since 1/11 study.  Significant microbiology data: 1/08>> appendix abscess culture: E. coli/Klebsiella  Procedures: 1/08>> CT-guided drainage of right pelvic abscess 1/17>> drain check by IR  Consults: IR ID Vascular surgery General surgery Nephrology  Subjective: No major issues overnight.  Was attempting to get out of bed to chair this morning.  Objective: Vitals: Blood pressure 138/80, pulse 84, temperature 98 F (36.7 C), temperature source Oral, resp. rate 16, height '5\' 5"'$  (1.651 m), weight 54.7 kg, SpO2 99 %.   Exam: Gen Exam:Alert awake-not in any distress HEENT:atraumatic, normocephalic Chest: B/L clear to auscultation anteriorly CVS:S1S2 regular Abdomen:soft non tender, non  distended Extremities:no edema Neurology: Non focal Skin: no rash  Pertinent Labs/Radiology:    Latest Ref Rng & Units 08/15/2022    4:57 AM 08/14/2022    5:12 AM 08/13/2022    3:29 AM  CBC  WBC 4.0 - 10.5 K/uL 28.5  24.5  27.8   Hemoglobin 12.0 - 15.0 g/dL 11.7  10.9  11.5   Hematocrit 36.0 - 46.0 % 34.2  31.8  33.9   Platelets 150 - 400 K/uL 365  336  347     Lab Results  Component Value Date   NA 132 (L) 08/13/2022   K 3.3 (L) 08/13/2022   CL 93 (L) 08/13/2022   CO2 24 08/13/2022      Assessment/Plan: Perforated appendicitis with periappendiceal abscess-s/p CT-guided drain placed on 1/8 by IR 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   Critical left limb ischemia with rest pain PAD-s/p left femoral-popliteal bypass graft- CT angio with occluded left femoral to below-knee popliteal artery bypass Remains on IV heparin Appreciate vascular surgery following-recommendations are to pursue revascularization once pelvic abscess has resolved.  ESRD on HD MWF Nephrology following and directing care  Acute metabolic encephalopathy Hospital delirium In the setting of acute illness/appendix abscess Significantly better/resolved with supportive care. Maintain delirium precautions  HFrEF Euvolemic Volume removal with HD  HLD Continue statin  HTN BP better-amlodipine adjusted 1/18 Continue BiDil/Coreg.   DM-2 (A1c 6.2 on 1/8) CBGs stable with SSI  Recent Labs    08/14/22 1251 08/14/22 2143 08/15/22 0839  GLUCAP 235* 128* 159*    Hypokalemia Replete/recheck  Neuropathic pain Rest  pain associated PAD Neurontin  Anxiety Continue with BuSpar as needed  Asthma COPD overlap No wheezing Continue bronchodilators  Complex right ovarian mass Outpatient GYN  follow-up-patient/spouse aware of this recommendation-potential malignancy  Left upper pole renal lesion Concern for malignancy-needs outpatient follow-up with urology for further workup  Debility/deconditioning PT/OT eval Home health recommended  Nutrition Status: Nutrition Problem: Moderate Malnutrition Etiology: chronic illness Signs/Symptoms: severe muscle depletion, moderate fat depletion Interventions: Nepro shake, MVI, Liberalize Diet  BMI: Estimated body mass index is 20.07 kg/m as calculated from the following:   Height as of this encounter: '5\' 5"'$  (1.651 m).   Weight as of this encounter: 54.7 kg.   Code status:   Code Status: Full Code   DVT Prophylaxis: SCDs Start: 07/28/2022 2213 IV heparin   Family Communication: Son [phone]-left voicemail on 1/18   Disposition Plan: Status is: Inpatient Remains inpatient appropriate because: Severity of illness.   Planned Discharge Destination:Home health   Diet: Diet Order             Diet regular Room service appropriate? No; Fluid consistency: Thin; Fluid restriction: 1200 mL Fluid  Diet effective now                     Antimicrobial agents: Anti-infectives (From admission, onward)    Start     Dose/Rate Route Frequency Ordered Stop   08/10/22 1400  cefTRIAXone (ROCEPHIN) 2 g in sodium chloride 0.9 % 100 mL IVPB        2 g 200 mL/hr over 30 Minutes Intravenous Every 24 hours 08/10/22 1025     08/10/22 1030  metroNIDAZOLE (FLAGYL) tablet 500 mg        500 mg Oral Every 12 hours 08/10/22 1025     08/08/22 1545  piperacillin-tazobactam (ZOSYN) IVPB 2.25 g  Status:  Discontinued        2.25 g 100 mL/hr over 30 Minutes Intravenous Every 8 hours 08/08/22 1455 08/10/22 1025   08/08/22 1415  ceFAZolin (ANCEF) IVPB 1 g/50 mL premix  Status:  Discontinued        1 g 100 mL/hr over 30 Minutes Intravenous Daily 08/08/22 1327 08/08/22 1454   08/04/2022 2245  piperacillin-tazobactam (ZOSYN) IVPB 2.25 g   Status:  Discontinued        2.25 g 100 mL/hr over 30 Minutes Intravenous Every 8 hours 08/04/2022 2215 08/08/22 1327   07/28/2022 1900  piperacillin-tazobactam (ZOSYN) IVPB 3.375 g        3.375 g 100 mL/hr over 30 Minutes Intravenous  Once 08/04/2022 1853 07/29/2022 2049        MEDICATIONS: Scheduled Meds:  acidophilus  1 capsule Oral Daily   amLODipine  10 mg Oral QHS   atorvastatin  40 mg Oral QHS   carvedilol  12.5 mg Oral Q M,W,F-HD   carvedilol  12.5 mg Oral 2 times per day on Tue Thu Sat   Chlorhexidine Gluconate Cloth  6 each Topical Q0600   doxercalciferol  7 mcg Intravenous Q M,W,F-HD   feeding supplement (NEPRO CARB STEADY)  237 mL Oral BID BM   ferric citrate  420 mg Oral TID WC   fluticasone furoate-vilanterol  1 puff Inhalation Daily   gabapentin  100 mg Oral QHS   Gerhardt's butt cream   Topical TID   insulin aspart  0-6 Units Subcutaneous TID WC   isosorbide-hydrALAZINE  1 tablet Oral 2 times per day on Sun Tue Thu Sat   isosorbide-hydrALAZINE  1 tablet Oral Q M,W,F-HD   metroNIDAZOLE  500 mg Oral Q12H   multivitamin  1 tablet Oral QHS   pantoprazole  40 mg Oral BID   potassium chloride  40 mEq Oral Once   sodium chloride flush  5 mL Intracatheter Q8H   ursodiol  300 mg Oral BID   Continuous Infusions:  cefTRIAXone (ROCEPHIN)  IV 2 g (08/14/22 2150)   heparin 750 Units/hr (08/14/22 2333)   PRN Meds:.albuterol, busPIRone, fluticasone, hydrALAZINE, ondansetron (ZOFRAN) IV, ondansetron, oxyCODONE, prochlorperazine, traZODone   I have personally reviewed following labs and imaging studies  LABORATORY DATA: CBC: Recent Labs  Lab 08/12/22 1011 08/12/22 1432 08/13/22 0329 08/14/22 0512 08/15/22 0457  WBC 24.5* 25.5* 27.8* 24.5* 28.5*  HGB 11.0* 10.4* 11.5* 10.9* 11.7*  HCT 32.5* 30.0* 33.9* 31.8* 34.2*  MCV 93.7 92.9 92.4 93.5 94.0  PLT 306 334 347 336 365     Basic Metabolic Panel: Recent Labs  Lab 08/09/22 0502 08/10/22 0446 08/12/22 1432  08/13/22 0329  NA 132* 128* 122* 132*  K 3.0* 3.2* 2.5* 3.3*  CL 91* 90* 84* 93*  CO2 22 21* 21* 24  GLUCOSE 159* 190* 251* 148*  BUN 34* 41* 37* 11  CREATININE 5.01* 6.06* 6.50* 2.92*  CALCIUM 10.0 10.0 9.6 9.3  PHOS 4.1  --  4.4 2.6     GFR: Estimated Creatinine Clearance: 15.3 mL/min (A) (by C-G formula based on SCr of 2.92 mg/dL (H)).  Liver Function Tests: Recent Labs  Lab 08/09/22 0502 08/12/22 1432 08/13/22 0329  ALBUMIN 2.3* 2.0* 2.1*    No results for input(s): "LIPASE", "AMYLASE" in the last 168 hours. No results for input(s): "AMMONIA" in the last 168 hours.  Coagulation Profile: No results for input(s): "INR", "PROTIME" in the last 168 hours.  Cardiac Enzymes: No results for input(s): "CKTOTAL", "CKMB", "CKMBINDEX", "TROPONINI" in the last 168 hours.  BNP (last 3 results) No results for input(s): "PROBNP" in the last 8760 hours.  Lipid Profile: No results for input(s): "CHOL", "HDL", "LDLCALC", "TRIG", "CHOLHDL", "LDLDIRECT" in the last 72 hours.  Thyroid Function Tests: No results for input(s): "TSH", "T4TOTAL", "FREET4", "T3FREE", "THYROIDAB" in the last 72 hours.  Anemia Panel: No results for input(s): "VITAMINB12", "FOLATE", "FERRITIN", "TIBC", "IRON", "RETICCTPCT" in the last 72 hours.  Urine analysis:    Component Value Date/Time   COLORURINE YELLOW 07/07/2016 0914   APPEARANCEUR CLOUDY (A) 07/07/2016 0914   LABSPEC >1.030 (H) 07/07/2016 0914   PHURINE 5.5 07/07/2016 0914   GLUCOSEU NEGATIVE 07/07/2016 0914   HGBUR SMALL (A) 07/07/2016 0914   BILIRUBINUR SMALL (A) 07/07/2016 0914   KETONESUR 15 (A) 07/07/2016 0914   PROTEINUR >300 (A) 07/07/2016 0914   UROBILINOGEN 0.2 07/30/2014 2000   NITRITE NEGATIVE 07/07/2016 0914   LEUKOCYTESUR SMALL (A) 07/07/2016 0914    Sepsis Labs: Lactic Acid, Venous    Component Value Date/Time   LATICACIDVEN 1.2 08/10/2022 0724    MICROBIOLOGY: No results found for this or any previous visit (from  the past 240 hour(s)).   RADIOLOGY STUDIES/RESULTS: No results found.   LOS: 13 days   Oren Binet, MD  [facility]    To contact the attending provider between 7A-7P or the covering provider during after hours 7P-7A, please log into the web site [email or web address] and access using universal Johnson password for that web site. If you do not have the password, please call the hospital operator.  08/15/2022, 10:38 AM

## 2022-08-16 DIAGNOSIS — K659 Peritonitis, unspecified: Secondary | ICD-10-CM | POA: Diagnosis not present

## 2022-08-16 DIAGNOSIS — K651 Peritoneal abscess: Secondary | ICD-10-CM | POA: Diagnosis not present

## 2022-08-16 DIAGNOSIS — E440 Moderate protein-calorie malnutrition: Secondary | ICD-10-CM | POA: Diagnosis not present

## 2022-08-16 DIAGNOSIS — M79606 Pain in leg, unspecified: Secondary | ICD-10-CM | POA: Diagnosis not present

## 2022-08-16 LAB — RENAL FUNCTION PANEL
Albumin: 2 g/dL — ABNORMAL LOW (ref 3.5–5.0)
Anion gap: 14 (ref 5–15)
BUN: 11 mg/dL (ref 8–23)
CO2: 22 mmol/L (ref 22–32)
Calcium: 9.8 mg/dL (ref 8.9–10.3)
Chloride: 95 mmol/L — ABNORMAL LOW (ref 98–111)
Creatinine, Ser: 3.91 mg/dL — ABNORMAL HIGH (ref 0.44–1.00)
GFR, Estimated: 12 mL/min — ABNORMAL LOW (ref >60)
Glucose, Bld: 182 mg/dL — ABNORMAL HIGH (ref 70–99)
Phosphorus: 3.6 mg/dL (ref 2.5–4.6)
Potassium: 3 mmol/L — ABNORMAL LOW (ref 3.5–5.1)
Sodium: 131 mmol/L — ABNORMAL LOW (ref 135–145)

## 2022-08-16 LAB — CBC WITH DIFFERENTIAL/PLATELET
Abs Immature Granulocytes: 0.79 10*3/uL — ABNORMAL HIGH (ref 0.00–0.07)
Basophils Absolute: 0.1 10*3/uL (ref 0.0–0.1)
Basophils Relative: 0 %
Eosinophils Absolute: 0.3 10*3/uL (ref 0.0–0.5)
Eosinophils Relative: 1 %
HCT: UNDETERMINED % (ref 36.0–46.0)
HCT: 32.5 % — ABNORMAL LOW (ref 36.0–46.0)
Hemoglobin: UNDETERMINED g/dL (ref 12.0–15.0)
Hemoglobin: 10.5 g/dL — ABNORMAL LOW (ref 12.0–15.0)
Immature Granulocytes: 3 %
Lymphocytes Relative: 8 %
Lymphs Abs: 1.8 10*3/uL (ref 0.7–4.0)
MCH: UNDETERMINED pg (ref 26.0–34.0)
MCH: 31.3 pg (ref 26.0–34.0)
MCHC: UNDETERMINED g/dL (ref 30.0–36.0)
MCHC: 32.3 g/dL (ref 30.0–36.0)
MCV: UNDETERMINED fL (ref 80.0–100.0)
MCV: 96.7 fL (ref 80.0–100.0)
Monocytes Absolute: 1.7 10*3/uL — ABNORMAL HIGH (ref 0.1–1.0)
Monocytes Relative: 7 %
Neutro Abs: 18.8 10*3/uL — ABNORMAL HIGH (ref 1.7–7.7)
Neutrophils Relative %: 81 %
Platelets: UNDETERMINED 10*3/uL (ref 150–400)
Platelets: 318 10*3/uL (ref 150–400)
RBC: UNDETERMINED MIL/uL (ref 3.87–5.11)
RBC: 3.36 MIL/uL — ABNORMAL LOW (ref 3.87–5.11)
RDW: UNDETERMINED % (ref 11.5–15.5)
RDW: 13.8 % (ref 11.5–15.5)
WBC: UNDETERMINED 10*3/uL (ref 4.0–10.5)
WBC: 23.4 10*3/uL — ABNORMAL HIGH (ref 4.0–10.5)
nRBC: UNDETERMINED % (ref 0.0–0.2)
nRBC: 0.1 % (ref 0.0–0.2)

## 2022-08-16 LAB — HEPARIN LEVEL (UNFRACTIONATED): Heparin Unfractionated: 0.49 IU/mL (ref 0.30–0.70)

## 2022-08-16 LAB — GLUCOSE, CAPILLARY
Glucose-Capillary: 168 mg/dL — ABNORMAL HIGH (ref 70–99)
Glucose-Capillary: 230 mg/dL — ABNORMAL HIGH (ref 70–99)
Glucose-Capillary: 168 mg/dL — ABNORMAL HIGH (ref 70–99)
Glucose-Capillary: 222 mg/dL — ABNORMAL HIGH (ref 70–99)

## 2022-08-16 MED ORDER — CHLORHEXIDINE GLUCONATE CLOTH 2 % EX PADS
6.0000 | MEDICATED_PAD | Freq: Every day | CUTANEOUS | Status: DC
  Administered 2022-08-17 – 2022-08-28 (×12): 6 via TOPICAL

## 2022-08-16 MED ORDER — POTASSIUM CHLORIDE 20 MEQ PO PACK
40.0000 meq | PACK | Freq: Once | ORAL | Status: AC
  Administered 2022-08-16: 40 meq via ORAL
  Filled 2022-08-16: qty 2

## 2022-08-16 NOTE — Progress Notes (Signed)
ANTICOAGULATION CONSULT NOTE  Pharmacy Consult for Heparin Indication:  Limb ischemia     Allergies  Allergen Reactions   Adhesive  [Tape] Hives   Lactose Intolerance (Gi) Diarrhea   Lisinopril Cough   Prednisone Swelling and Other (See Comments)    Excessive fluid buildup    Patient Measurements: Height: '5\' 5"'$  (165.1 cm) Weight: 54.7 kg (120 lb 9.5 oz) IBW/kg (Calculated) : 57  Vital Signs: Temp: 98.4 F (36.9 C) (01/21 0304) Temp Source: Oral (01/21 0304) BP: 124/49 (01/21 0304) Pulse Rate: 76 (01/21 0304)  Labs: Recent Labs    08/14/22 0512 08/15/22 0457 08/16/22 0327  HGB 10.9* 11.7* 10.8*  HCT 31.8* 34.2* 32.5*  PLT 336 365 280  HEPARINUNFRC 0.49 0.45 0.49  CREATININE  --   --  3.91*     Estimated Creatinine Clearance: 11.4 mL/min (A) (by C-G formula based on SCr of 3.91 mg/dL (H)).   Assessment: 72 year old female continues heparin for limb ischemia.  Planning to pursue revascularization once pelvic abscess has improved/resolved.   Heparin level therapeutic at 0.49 this am, communicated with today's RN to reach out to pharmacist with any concerns.   Goal of Therapy:  Heparin level 0.3-0.7 units/ml Monitor platelets by anticoagulation protocol: Yes   Plan:  Continue Heparin at 750 units / hr Continue daily levels  Monitor for signs and symptoms of bleeding.   Thank you Vicenta Dunning, PharmD  PGY1 Pharmacy Resident

## 2022-08-16 NOTE — Progress Notes (Signed)
[facility] Progress Note   Subjective:   Patient seen and examined in room with husband at bedside.  Sitting up picking at her breakfast.  Admits to abdominal pain and vomits x2 while I am in the room.  Abdominal pain improved after vomiting.  Appetite poor.  Husband concerned about patient mentation and word finding difficultly.  He is concerned she may have had a stroke at some point.  Patient oriented but slow to respond.  Recognized me by name as I entered the room.   Denies CP, SOB and diarrhea. Continues to have pain in L leg.   Objective Vitals:   08/15/22 2000 08/15/22 2306 08/16/22 0000 08/16/22 0304  BP: (!) 151/61 (!) 122/50 101/66 (!) 124/49  Pulse: 76 77 76 76  Resp: '20 19 14 13  '$ Temp: 98.2 F (36.8 C) 98 F (36.7 C) 98 F (36.7 C) 98.4 F (36.9 C)  TempSrc: Oral Oral Oral Oral  SpO2: 99% 100% 98% 98%  Weight:      Height:       Physical Exam General:chronically ill appearing female in NAD, oriented but slow to respond HEENT: symmetric, no facial droop, poor dentition Heart:RRR, no mrg Lungs:CTAB, nml WOB on RA Abdomen:soft, NT, RLQ drain in place Extremities:no LE edema, compression socks and braces in place Dialysis Access: LU AVG +b/t   Filed Weights   08/12/22 1851 08/14/22 1530 08/14/22 1853  Weight: 57.7 kg 57.2 kg 54.7 kg   No intake or output data in the 24 hours ending 08/16/22 1133  Additional Objective Labs: Basic Metabolic Panel: Recent Labs  Lab 08/12/22 1432 08/13/22 0329 08/16/22 0327  NA 122* 132* 131*  K 2.5* 3.3* 3.0*  CL 84* 93* 95*  CO2 21* 24 22  GLUCOSE 251* 148* 182*  BUN 37* 11 11  CREATININE 6.50* 2.92* 3.91*  CALCIUM 9.6 9.3 9.8  PHOS 4.4 2.6 3.6   Liver Function Tests: Recent Labs  Lab 08/12/22 1432 08/13/22 0329 08/16/22 0327  ALBUMIN 2.0* 2.1* 2.0*   CBC: Recent Labs  Lab 08/13/22 0329 08/14/22 0512 08/15/22 0457 08/16/22 0327 08/16/22 0650  WBC 27.8* 24.5* 28.5* 22.5* 23.4*  NEUTROABS   --   --   --  QUANTITY NOT SUFFICIENT, UNABLE TO PERFORM TEST RECOLLECT CBCD  CALLED TO TANYA DECAREAUS RN 0345 BTAYLOR 18.8*  HGB 11.5* 10.9* 11.7* 10.8* 10.5*  HCT 33.9* 31.8* 34.2* 32.5* 32.5*  MCV 92.4 93.5 94.0 94.8 96.7  PLT 347 336 365 280 318   Blood Culture    Component Value Date/Time   SDES WOUND 08/03/2022 1326   SPECREQUEST  ABCESS ABDOMEN 08/03/2022 1326   CULT  08/03/2022 1326    ABUNDANT ESCHERICHIA COLI MODERATE KLEBSIELLA PNEUMONIAE ABUNDANT BACTEROIDES THETAIOTAOMICRON BETA LACTAMASE POSITIVE Performed at [facility], [address] [place], [place] [address]    REPTSTATUS 08/08/2022 FINAL 08/03/2022 1326    CBG: Recent Labs  Lab 08/15/22 0839 08/15/22 1224 08/15/22 1613 08/15/22 2131 08/16/22 0856  GLUCAP 159* 279* 132* 120* 168*   Medications:  cefTRIAXone (ROCEPHIN)  IV 2 g (08/15/22 1242)   heparin 750 Units/hr (08/14/22 2333)    acidophilus  1 capsule Oral Daily   amLODipine  10 mg Oral QHS   atorvastatin  40 mg Oral QHS   carvedilol  12.5 mg Oral Q M,W,F-HD   carvedilol  12.5 mg Oral 2 times per day on Tue Thu Sat   Chlorhexidine Gluconate Cloth  6 each Topical Q0600  doxercalciferol  7 mcg Intravenous Q M,W,F-HD   feeding supplement (NEPRO CARB STEADY)  237 mL Oral BID BM   ferric citrate  420 mg Oral TID WC   fluticasone furoate-vilanterol  1 puff Inhalation Daily   gabapentin  100 mg Oral QHS   Gerhardt's butt cream   Topical TID   insulin aspart  0-6 Units Subcutaneous TID WC   isosorbide-hydrALAZINE  1 tablet Oral 2 times per day on Sun Tue Thu Sat   isosorbide-hydrALAZINE  1 tablet Oral Q M,W,F-HD   metroNIDAZOLE  500 mg Oral Q12H   multivitamin  1 tablet Oral QHS   pantoprazole  40 mg Oral BID   sodium chloride flush  5 mL Intracatheter Q8H   ursodiol  300 mg Oral BID    Dialysis Orders: [place] MWF  4h  400/600   57.4kg   2/3.5 bath LUA AVG  Heparin 2500+ 1012mdrun - last HD 1/5, post wt 57.3kg - hectorol 7 ug IV  tiw - venofer 50 q week - mircera 30 ug q4 wks, last 12/8, due now   Assessment/Plan: Abd pain/ intra-abdominal infection or abscess/perforated appendicitis - per CT imaging. Gen surg and IR consulted, started on IV abx. IR placed drain in RLQ fluid collection. Fluid sent for culture, growing E coli/ Klebsiella. Bacteroides. Per pmd. 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 No urgent role for surgery per notes. Plan for repeat scan early next week. Per surgery/IR.  PAD/critical limb ischemia w/rest pain - CTA on 1/15 w/occluded L fem-pop. On Heparin.  VVS consulting, if improvement noted in abscess on repeat CT, may proceed with endovascular intervention d/t concern for compromise of LE and expected long recovery time for abdominal abscess.   ESRD - on HD MWF. HD on schedule. Next HD 1/22-- 4K bath  Hypokalemia - diet liberalized but poor appetite. Encouraged nutrition. Supplements given.  HTN/ volume - Appears mostly euvolemic on exam.  Set UF goal 1-2L as tolerated. Bps have been high --Improved with resumed home amlodipine, carvedilol and bidil.  Hold pre HD d/t intra dialytic hypotension.  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 no ESA needed  Continue to monitor. If continues to drop will order ESA. MBD ckd - CCa elevated hold Hectorol, phos trending down, if continues to decrease will hold binder. On Auryxia when taking meals DM 2 - per pmd COPD/ asthma - no symptoms Encephalopahty: likely multifactorial.  Waxing and wanning. Stopped gabapentin. Ordered CT head to r/o stroke.  Nutrition - Alb low. Currently on regular diet.  Follow labs. If K^ will need to change to renal diet. Add protein supplements.   LJen Mow PA-C [facility] 08/16/2022,11:33 AM  LOS: 14 days

## 2022-08-16 NOTE — Progress Notes (Signed)
PROGRESS NOTE        PATIENT DETAILS Name: Destiny Day Age: 72 y.o. Sex: female Date of Birth: 1950/12/29 Admit Date: 07/31/2022 Admitting Physician Clance Boll, MD [id number], Rachel Moulds, NP  Brief Summary: Patient is a 72 y.o.  female with history of ESRD on HD MWF, chronic HFpEF, PAD-s/p bilateral TMA who presented with RLQ pain-found to have perforated appendicitis with abscess  Significant events: 1/07>> admitted TRH-RLQ pain-subsequently found to have perforated appendicitis with abscess.  Significant studies: 1/07>> RUQ ultrasound: Cholelithiasis with positive sonographic Murphy sign. 1/07>> CT abdomen/pelvis: Findings suspicious for perforated appendicitis with periappendiceal abscess 1/11>> CT abdomen/pelvis: Interval decrease in size of the right pelvic abscess-s/p percutaneous drainage. 1/15>> HIDA scan: No evidence of acute cholecystitis. 1/15>> CT angio abdominal aorta: Severe atherosclerotic disease throughout abdomen/pelvis and bilateral lower extremities.  Severe left outflow disease with occlusion of left femoral-popliteal bypass graft, occlusion of native left SFA.  Complex collection in the right hemipelvis-enlarged since 1/11 study.  Significant microbiology data: 1/08>> appendix abscess culture: E. coli/Klebsiella  Procedures: 1/08>> CT-guided drainage of right pelvic abscess 1/17>> drain check by IR  Consults: IR ID Vascular surgery General surgery Nephrology  Subjective: Lying comfortably in bed-some intermittent left leg pain/breast pain continues.  No abdominal pain this morning.  Objective: Vitals: Blood pressure (!) 124/49, pulse 76, temperature 98.4 F (36.9 C), temperature source Oral, resp. rate 13, height '5\' 5"'$  (1.651 m), weight 54.7 kg, SpO2 98 %.   Exam: Gen Exam:Alert awake-not in any distress HEENT:atraumatic, normocephalic Chest: B/L clear to auscultation anteriorly CVS:S1S2 regular Abdomen:soft  non tender, non distended Extremities:no edema.  Bilateral transmetatarsal amputation stump looks benign-no evidence of gangrene. 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  Critical left limb ischemia with rest pain PAD-s/p left femoral-popliteal bypass graft- CT angio with occluded left femoral to below-knee popliteal artery bypass Remains on IV heparin Appreciate vascular surgery following-recommendations are to pursue revascularization once pelvic abscess has resolved.  ESRD on HD MWF Nephrology following and directing care  Hypokalemia Replete/recheck  Acute metabolic encephalopathy Hospital delirium In the setting of acute illness/appendix abscess Significantly better/resolved with supportive care. Maintain delirium precautions  HFrEF Euvolemic Volume removal with HD  HLD Continue statin  HTN BP better-amlodipine adjusted 1/18 Continue BiDil/Coreg.   DM-2 (A1c 6.2 on 1/8) CBGs stable with SSI  Recent Labs    08/15/22 1613 08/15/22 2131 08/16/22 0856  GLUCAP 132* 120* 168*  Hypokalemia Replete/recheck  Neuropathic pain Rest  pain associated PAD Neurontin  Anxiety Continue with BuSpar as  needed  Asthma COPD overlap No wheezing Continue bronchodilators  Complex right ovarian mass Outpatient GYN follow-up-patient/spouse aware of this recommendation-potential malignancy  Left upper pole renal lesion Concern for malignancy-needs outpatient follow-up with urology for further workup  Debility/deconditioning PT/OT eval Home health recommended  Nutrition Status: Nutrition Problem: Moderate Malnutrition Etiology: chronic illness Signs/Symptoms: severe muscle depletion, moderate fat depletion Interventions: Nepro shake, MVI, Liberalize Diet  BMI: Estimated body mass index is 20.07 kg/m as calculated from the following:   Height as of this encounter: '5\' 5"'$  (1.651 m).   Weight as of this encounter: 54.7 kg.   Code status:   Code Status: Full Code   DVT Prophylaxis: SCDs Start: 08/16/2022 2213 IV heparin   Family Communication: Son James-[phone]-left voicemail on 1/18   Disposition Plan: Status is: Inpatient Remains inpatient appropriate because: Severity of illness.   Planned Discharge Destination:Home health   Diet: Diet Order             Diet regular Room service appropriate? No; Fluid consistency: Thin; Fluid restriction: 1200 mL Fluid  Diet effective now                     Antimicrobial agents: Anti-infectives (From admission, onward)    Start     Dose/Rate Route Frequency Ordered Stop   08/10/22 1400  cefTRIAXone (ROCEPHIN) 2 g in sodium chloride 0.9 % 100 mL IVPB        2 g 200 mL/hr over 30 Minutes Intravenous Every 24 hours 08/10/22 1025     08/10/22 1030  metroNIDAZOLE (FLAGYL) tablet 500 mg        500 mg Oral Every 12 hours 08/10/22 1025     08/08/22 1545  piperacillin-tazobactam (ZOSYN) IVPB 2.25 g  Status:  Discontinued        2.25 g 100 mL/hr over 30 Minutes Intravenous Every 8 hours 08/08/22 1455 08/10/22 1025   08/08/22 1415  ceFAZolin (ANCEF) IVPB 1 g/50 mL premix  Status:  Discontinued        1 g 100 mL/hr over 30  Minutes Intravenous Daily 08/08/22 1327 08/08/22 1454   08/07/2022 2245  piperacillin-tazobactam (ZOSYN) IVPB 2.25 g  Status:  Discontinued        2.25 g 100 mL/hr over 30 Minutes Intravenous Every 8 hours 08/21/2022 2215 08/08/22 1327   08/11/2022 1900  piperacillin-tazobactam (ZOSYN) IVPB 3.375 g        3.375 g 100 mL/hr over 30 Minutes Intravenous  Once 08/06/2022 1853 08/23/2022 2049        MEDICATIONS: Scheduled Meds:  acidophilus  1 capsule Oral Daily   amLODipine  10 mg Oral QHS   atorvastatin  40 mg Oral QHS   carvedilol  12.5 mg Oral Q M,W,F-HD   carvedilol  12.5 mg Oral 2 times per day on Tue Thu Sat   Chlorhexidine Gluconate Cloth  6 each Topical Q0600   doxercalciferol  7 mcg Intravenous Q M,W,F-HD   feeding supplement (NEPRO CARB STEADY)  237 mL Oral BID BM   ferric citrate  420 mg Oral TID WC   fluticasone furoate-vilanterol  1 puff Inhalation Daily   gabapentin  100 mg Oral QHS   Gerhardt's butt cream   Topical TID   insulin aspart  0-6 Units Subcutaneous TID WC   isosorbide-hydrALAZINE  1 tablet Oral 2 times per day on Sun Tue Thu Sat  isosorbide-hydrALAZINE  1 tablet Oral Q M,W,F-HD   metroNIDAZOLE  500 mg Oral Q12H   multivitamin  1 tablet Oral QHS   pantoprazole  40 mg Oral BID   sodium chloride flush  5 mL Intracatheter Q8H   ursodiol  300 mg Oral BID   Continuous Infusions:  cefTRIAXone (ROCEPHIN)  IV 2 g (08/15/22 1242)   heparin 750 Units/hr (08/14/22 2333)   PRN Meds:.albuterol, busPIRone, fluticasone, hydrALAZINE, ondansetron (ZOFRAN) IV, ondansetron, oxyCODONE, prochlorperazine, traZODone   I have personally reviewed following labs and imaging studies  LABORATORY DATA: CBC: Recent Labs  Lab 08/13/22 0329 08/14/22 0512 08/15/22 0457 08/16/22 0327 08/16/22 0650  WBC 27.8* 24.5* 28.5* 22.5* 23.4*  NEUTROABS  --   --   --  QUANTITY NOT SUFFICIENT, UNABLE TO PERFORM TEST RECOLLECT CBCD  CALLED TO TANYA DECAREAUS RN 0345 BTAYLOR 18.8*  HGB 11.5* 10.9*  11.7* 10.8* 10.5*  HCT 33.9* 31.8* 34.2* 32.5* 32.5*  MCV 92.4 93.5 94.0 94.8 96.7  PLT 347 336 365 280 318     Basic Metabolic Panel: Recent Labs  Lab 08/10/22 0446 08/12/22 1432 08/13/22 0329 08/16/22 0327  NA 128* 122* 132* 131*  K 3.2* 2.5* 3.3* 3.0*  CL 90* 84* 93* 95*  CO2 21* 21* 24 22  GLUCOSE 190* 251* 148* 182*  BUN 41* 37* 11 11  CREATININE 6.06* 6.50* 2.92* 3.91*  CALCIUM 10.0 9.6 9.3 9.8  PHOS  --  4.4 2.6 3.6     GFR: Estimated Creatinine Clearance: 11.4 mL/min (A) (by C-G formula based on SCr of 3.91 mg/dL (H)).  Liver Function Tests: Recent Labs  Lab 08/12/22 1432 08/13/22 0329 08/16/22 0327  ALBUMIN 2.0* 2.1* 2.0*    No results for input(s): "LIPASE", "AMYLASE" in the last 168 hours. No results for input(s): "AMMONIA" in the last 168 hours.  Coagulation Profile: No results for input(s): "INR", "PROTIME" in the last 168 hours.  Cardiac Enzymes: No results for input(s): "CKTOTAL", "CKMB", "CKMBINDEX", "TROPONINI" in the last 168 hours.  BNP (last 3 results) No results for input(s): "PROBNP" in the last 8760 hours.  Lipid Profile: No results for input(s): "CHOL", "HDL", "LDLCALC", "TRIG", "CHOLHDL", "LDLDIRECT" in the last 72 hours.  Thyroid Function Tests: No results for input(s): "TSH", "T4TOTAL", "FREET4", "T3FREE", "THYROIDAB" in the last 72 hours.  Anemia Panel: No results for input(s): "VITAMINB12", "FOLATE", "FERRITIN", "TIBC", "IRON", "RETICCTPCT" in the last 72 hours.  Urine analysis:    Component Value Date/Time   COLORURINE YELLOW 07/07/2016 0914   APPEARANCEUR CLOUDY (A) 07/07/2016 0914   LABSPEC >1.030 (H) 07/07/2016 0914   PHURINE 5.5 07/07/2016 0914   GLUCOSEU NEGATIVE 07/07/2016 0914   HGBUR SMALL (A) 07/07/2016 0914   BILIRUBINUR SMALL (A) 07/07/2016 0914   KETONESUR 15 (A) 07/07/2016 0914   PROTEINUR >300 (A) 07/07/2016 0914   UROBILINOGEN 0.2 07/30/2014 2000   NITRITE NEGATIVE 07/07/2016 0914   LEUKOCYTESUR  SMALL (A) 07/07/2016 0914    Sepsis Labs: Lactic Acid, Venous    Component Value Date/Time   LATICACIDVEN 1.2 08/10/2022 0724    MICROBIOLOGY: No results found for this or any previous visit (from the past 240 hour(s)).   RADIOLOGY STUDIES/RESULTS: No results found.   LOS: 14 days   Oren Binet, MD  [facility]    To contact the attending provider between 7A-7P or the covering provider during after hours 7P-7A, please log into the web site [email or web address] and access using universal [place] password for that web site. If you  do not have the password, please call the hospital operator.  08/16/2022, 9:44 AM

## 2022-08-17 ENCOUNTER — Inpatient Hospital Stay (HOSPITAL_COMMUNITY): Payer: Medicare Other

## 2022-08-17 DIAGNOSIS — M79606 Pain in leg, unspecified: Secondary | ICD-10-CM | POA: Diagnosis not present

## 2022-08-17 DIAGNOSIS — E440 Moderate protein-calorie malnutrition: Secondary | ICD-10-CM | POA: Diagnosis not present

## 2022-08-17 DIAGNOSIS — K659 Peritonitis, unspecified: Secondary | ICD-10-CM | POA: Diagnosis not present

## 2022-08-17 DIAGNOSIS — K651 Peritoneal abscess: Secondary | ICD-10-CM | POA: Diagnosis not present

## 2022-08-17 LAB — CBC
HCT: 32.5 % — ABNORMAL LOW (ref 36.0–46.0)
HCT: 32.5 % — ABNORMAL LOW (ref 36.0–46.0)
Hemoglobin: 11 g/dL — ABNORMAL LOW (ref 12.0–15.0)
Hemoglobin: 10.6 g/dL — ABNORMAL LOW (ref 12.0–15.0)
MCH: 32.1 pg (ref 26.0–34.0)
MCH: 31.4 pg (ref 26.0–34.0)
MCHC: 33.8 g/dL (ref 30.0–36.0)
MCHC: 32.6 g/dL (ref 30.0–36.0)
MCV: 94.8 fL (ref 80.0–100.0)
MCV: 96.2 fL (ref 80.0–100.0)
Platelets: 288 10*3/uL (ref 150–400)
Platelets: 329 10*3/uL (ref 150–400)
RBC: 3.43 MIL/uL — ABNORMAL LOW (ref 3.87–5.11)
RBC: 3.38 MIL/uL — ABNORMAL LOW (ref 3.87–5.11)
RDW: 14 % (ref 11.5–15.5)
RDW: 14 % (ref 11.5–15.5)
WBC: 22.8 10*3/uL — ABNORMAL HIGH (ref 4.0–10.5)
WBC: 24.9 10*3/uL — ABNORMAL HIGH (ref 4.0–10.5)
nRBC: 0.1 % (ref 0.0–0.2)
nRBC: 0.1 % (ref 0.0–0.2)

## 2022-08-17 LAB — RENAL FUNCTION PANEL
Albumin: 2 g/dL — ABNORMAL LOW (ref 3.5–5.0)
Albumin: 2 g/dL — ABNORMAL LOW (ref 3.5–5.0)
Anion gap: 15 (ref 5–15)
Anion gap: 17 — ABNORMAL HIGH (ref 5–15)
BUN: 16 mg/dL (ref 8–23)
BUN: 18 mg/dL (ref 8–23)
CO2: 23 mmol/L (ref 22–32)
CO2: 22 mmol/L (ref 22–32)
Calcium: 10.1 mg/dL (ref 8.9–10.3)
Calcium: 10.1 mg/dL (ref 8.9–10.3)
Chloride: 93 mmol/L — ABNORMAL LOW (ref 98–111)
Chloride: 94 mmol/L — ABNORMAL LOW (ref 98–111)
Creatinine, Ser: 4.99 mg/dL — ABNORMAL HIGH (ref 0.44–1.00)
Creatinine, Ser: 5.09 mg/dL — ABNORMAL HIGH (ref 0.44–1.00)
GFR, Estimated: 9 mL/min — ABNORMAL LOW (ref >60)
GFR, Estimated: 9 mL/min — ABNORMAL LOW (ref >60)
Glucose, Bld: 172 mg/dL — ABNORMAL HIGH (ref 70–99)
Glucose, Bld: 171 mg/dL — ABNORMAL HIGH (ref 70–99)
Phosphorus: 4 mg/dL (ref 2.5–4.6)
Phosphorus: 4.2 mg/dL (ref 2.5–4.6)
Potassium: 3.4 mmol/L — ABNORMAL LOW (ref 3.5–5.1)
Potassium: 3.9 mmol/L (ref 3.5–5.1)
Sodium: 131 mmol/L — ABNORMAL LOW (ref 135–145)
Sodium: 133 mmol/L — ABNORMAL LOW (ref 135–145)

## 2022-08-17 LAB — GLUCOSE, CAPILLARY
Glucose-Capillary: 183 mg/dL — ABNORMAL HIGH (ref 70–99)
Glucose-Capillary: 155 mg/dL — ABNORMAL HIGH (ref 70–99)
Glucose-Capillary: 145 mg/dL — ABNORMAL HIGH (ref 70–99)

## 2022-08-17 LAB — HEPARIN LEVEL (UNFRACTIONATED): Heparin Unfractionated: 0.46 IU/mL (ref 0.30–0.70)

## 2022-08-17 MED ORDER — LIDOCAINE HCL (PF) 1 % IJ SOLN: 5.0000 mL | INTRAMUSCULAR | Status: DC | PRN

## 2022-08-17 MED ORDER — HEPARIN SODIUM (PORCINE) 1000 UNIT/ML DIALYSIS
2500.0000 [IU] | INTRAMUSCULAR | Status: DC | PRN
  Administered 2022-08-17: 2500 [IU] via INTRAVENOUS_CENTRAL
  Filled 2022-08-17: qty 3

## 2022-08-17 MED ORDER — POTASSIUM CHLORIDE 20 MEQ PO PACK: 20.0000 meq | PACK | Freq: Once | ORAL | Status: DC

## 2022-08-17 MED ORDER — PENTAFLUOROPROP-TETRAFLUOROETH EX AERO: 1.0000 | INHALATION_SPRAY | CUTANEOUS | Status: DC | PRN

## 2022-08-17 MED ORDER — ANTICOAGULANT SODIUM CITRATE 4% (200MG/5ML) IV SOLN: 5.0000 mL | Status: DC | PRN

## 2022-08-17 MED ORDER — ALTEPLASE 2 MG IJ SOLR: 2.0000 mg | Freq: Once | INTRAMUSCULAR | Status: DC | PRN

## 2022-08-17 MED ORDER — HEPARIN SODIUM (PORCINE) 1000 UNIT/ML DIALYSIS
1000.0000 [IU] | INTRAMUSCULAR | Status: DC | PRN
  Administered 2022-08-17: 1000 [IU]
  Filled 2022-08-17: qty 1

## 2022-08-17 MED ORDER — LIDOCAINE-PRILOCAINE 2.5-2.5 % EX CREA: 1.0000 | TOPICAL_CREAM | CUTANEOUS | Status: DC | PRN

## 2022-08-17 NOTE — Progress Notes (Signed)
[facility] Progress Note     Subjective: CC-  Seen in dialysis. Feels about the same. Continues to have some intermittent RLQ abdominal pain. Does not think she had any bowel movements yesterday, last documented BM 1/16. PO intake not recorded and patient can't really tell me if she's eating. Did not take Nepro shakes yesterday. Drain with 0cc output documented over the weekend. WBC 24.9 from 22.8 yesterday, but overall down from 27.8 a few days ago. Afebrile.  Objective: Vital signs in last 24 hours: Temp:  [97.6 F (36.4 C)-98.2 F (36.8 C)] 98.2 F (36.8 C) (01/22 0420) Pulse Rate:  [65-82] 67 (01/22 0930) Resp:  [9-25] 12 (01/22 0930) BP: (109-158)/(48-61) 141/48 (01/22 0930) SpO2:  [87 %-100 %] 100 % (01/22 0930) Weight:  [57.8 kg-58.5 kg] 57.8 kg (01/22 0820) Last BM Date : 08/16/22  Intake/Output from previous day: 01/21 0701 - 01/22 0700 In: 805.3 [I.V.:545.4; IV Piggyback:249.9] Out: 0  Intake/Output this shift: No intake/output data recorded.  PE: Gen:  Alert, NAD Pulm:  rate and effort normal on room air Abd: soft, ND, mild RLQ TTP without rebound or guarding, drain with no output in gravity bag  Lab Results:  Recent Labs    08/17/22 0237 08/17/22 0715  WBC 22.8* 24.9*  HGB 11.0* 10.6*  HCT 32.5* 32.5*  PLT 288 329   BMET Recent Labs    08/17/22 0237 08/17/22 0715  NA 131* 133*  K 3.4* 3.9  CL 93* 94*  CO2 23 22  GLUCOSE 172* 171*  BUN 16 18  CREATININE 4.99* 5.09*  CALCIUM 10.1 10.1   PT/INR No results for input(s): "LABPROT", "INR" in the last 72 hours. CMP     Component Value Date/Time   NA 133 (L) 08/17/2022 0715   NA 135 (A) 07/15/2016 0000   K 3.9 08/17/2022 0715   CL 94 (L) 08/17/2022 0715   CO2 22 08/17/2022 0715   GLUCOSE 171 (H) 08/17/2022 0715   BUN 18 08/17/2022 0715   BUN 30 (A) 07/15/2016 0000   CREATININE 5.09 (H) 08/17/2022 0715   CALCIUM 10.1 08/17/2022 0715   PROT 6.0 (L) 08/05/2022 0403   ALBUMIN  2.0 (L) 08/17/2022 0715   AST 15 08/05/2022 0403   ALT 10 08/05/2022 0403   ALKPHOS 73 08/05/2022 0403   BILITOT 0.5 08/05/2022 0403   GFRNONAA 9 (L) 08/17/2022 0715   GFRAA 6 (L) 06/26/2019 0500   Lipase     Component Value Date/Time   LIPASE 32 08/19/2022 1730       Studies/Results: CT HEAD WO CONTRAST (5MM)  Result Date: 08/17/2022 CLINICAL DATA:  Altered mental status EXAM: CT HEAD WITHOUT CONTRAST TECHNIQUE: Contiguous axial images were obtained from the base of the skull through the vertex without intravenous contrast. RADIATION DOSE REDUCTION: This exam was performed according to the departmental dose-optimization program which includes automated exposure control, adjustment of the mA and/or kV according to patient size and/or use of iterative reconstruction technique. COMPARISON:  05/07/2018 FINDINGS: Brain: There is no mass, hemorrhage or extra-axial collection. There is generalized atrophy without lobar predilection. Hypodensity of the white matter is most commonly associated with chronic microvascular disease. Vascular: Atherosclerotic calcification of the vertebral and internal carotid arteries at the skull base. No abnormal hyperdensity of the major intracranial arteries or dural venous sinuses. Skull: The visualized skull base, calvarium and extracranial soft tissues are normal. Sinuses/Orbits: No fluid levels or advanced mucosal thickening of the visualized paranasal sinuses. No mastoid or middle  ear effusion. The orbits are normal. IMPRESSION: 1. No acute intracranial abnormality. 2. Generalized atrophy and findings of chronic microvascular disease. Electronically Signed   By: Ulyses Jarred M.D.   On: 08/17/2022 01:07    Anti-infectives: Anti-infectives (From admission, onward)    Start     Dose/Rate Route Frequency Ordered Stop   08/10/22 1400  cefTRIAXone (ROCEPHIN) 2 g in sodium chloride 0.9 % 100 mL IVPB        2 g 200 mL/hr over 30 Minutes Intravenous Every 24 hours  08/10/22 1025     08/10/22 1030  metroNIDAZOLE (FLAGYL) tablet 500 mg        500 mg Oral Every 12 hours 08/10/22 1025     08/08/22 1545  piperacillin-tazobactam (ZOSYN) IVPB 2.25 g  Status:  Discontinued        2.25 g 100 mL/hr over 30 Minutes Intravenous Every 8 hours 08/08/22 1455 08/10/22 1025   08/08/22 1415  ceFAZolin (ANCEF) IVPB 1 g/50 mL premix  Status:  Discontinued        1 g 100 mL/hr over 30 Minutes Intravenous Daily 08/08/22 1327 08/08/22 1454   07/27/2022 2245  piperacillin-tazobactam (ZOSYN) IVPB 2.25 g  Status:  Discontinued        2.25 g 100 mL/hr over 30 Minutes Intravenous Every 8 hours 07/27/2022 2215 08/08/22 1327   08/03/2022 1900  piperacillin-tazobactam (ZOSYN) IVPB 3.375 g        3.375 g 100 mL/hr over 30 Minutes Intravenous  Once 08/13/2022 1853 08/09/2022 2049        Assessment/Plan Suspected perforated appendicitis with pelvic abscess - S/p IR drain placement 1/8. 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 To gravity drainage, no flushes. Hopefully fistula will close over time with drain in place. No urgent role for surgery. - WBC 24.9 from 22.8 yesterday, but overall down from 27.8 a few days ago. Afebrile. Drain with no output over the weekend. Likely will need repeat CT this week, will discuss timing with MD. - continue abx per ID   -ok from a general surgery standpoint for Vascular Surgery to do an A-gram and stents at their discretion.  Issue will remain when to address the perforated appendix and abscess in light of her need for an eventual lower extremity bypass which would likely require PTFE. Vascular is following and likely also planning repeat CT scan early this week as well   FEN: Reg diet with fluid restriction 1258m, Nepro shakes ID: [id number] rocephin/flagyl per ID  VTE: SCD's, heparin gtt Foley: none   ESRD CHF HTN Neuropathy HTN OSA  I reviewed last 24 h vitals and pain scores, last 48 h intake and output, and last 24 h labs and  trends.    LOS: 15 days    [facility] 08/17/2022, 10:01 AM Please see Amion for pager number during day hours 7:00am-4:30pm

## 2022-08-17 NOTE — Progress Notes (Signed)
PROGRESS NOTE        PATIENT DETAILS Name: Destiny Day Age: 72 y.o. Sex: female Date of Birth: [birth year] Admit Date: 08/01/2022 Admitting Physician Clance Boll, MD XKG:YJEHU, Rachel Moulds, NP  Brief Summary: Patient is a 72 y.o.  female with history of ESRD on HD MWF, chronic HFpEF, PAD-s/p bilateral TMA who presented with RLQ pain-found to have perforated appendicitis with abscess.  Hospital course complicated by worsening left lower extremity rest pain due to critical limb ischemia.  See below for further details.  Significant events: 1/07>> admitted TRH-RLQ pain-subsequently found to have perforated appendicitis with abscess.  Significant studies: 1/07>> RUQ ultrasound: Cholelithiasis with positive sonographic Murphy sign. 1/07>> CT abdomen/pelvis: Findings suspicious for perforated appendicitis with periappendiceal abscess 1/11>> CT abdomen/pelvis: Interval decrease in size of the right pelvic abscess-s/p percutaneous drainage. 1/15>> HIDA scan: No evidence of acute cholecystitis. 1/15>> CT angio abdominal aorta: Severe atherosclerotic disease throughout abdomen/pelvis and bilateral lower extremities.  Severe left outflow disease with occlusion of left femoral-popliteal bypass graft, occlusion of native left SFA.  Complex collection in the right hemipelvis-enlarged since 1/11 study. 1/22>> CT head:No acute abnormality.  Significant microbiology data: 1/08>> appendix abscess culture: E. coli/Klebsiella  Procedures: 1/08>> CT-guided drainage of right pelvic abscess 1/17>> drain check by IR  Consults: IR ID Vascular surgery General surgery Nephrology  Subjective: No major issues overnight.  Continues to have some intermittent RLQ pain.  Relatively alert/awake today.  Objective: Vitals: Blood pressure (!) 101/39, pulse 77, temperature 98.2 F (36.8 C), temperature source Oral, resp. rate 16, height '5\' 5"'$  (1.651 m), weight 57.8 kg, SpO2 100  %.   Exam: Gen Exam:Alert awake-not in any distress HEENT:atraumatic, normocephalic Chest: B/L clear to auscultation anteriorly CVS:S1S2 regular Abdomen:soft non tender, non distended Extremities:no edema-bilateral transmetatarsal amputation stump site looks benign. 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 Await further recommendations from [facility]/infectious disease.    Critical left limb ischemia with rest pain PAD-s/p left femoral-popliteal bypass graft- CT angio with occluded left femoral to below-knee popliteal artery bypass Remains on IV heparin Appreciate vascular surgery following-recommendations are to pursue revascularization once pelvic abscess has resolved.  ESRD on HD MWF Nephrology following and directing care  Hypokalemia Repleted.  Acute metabolic encephalopathy Hospital delirium In the setting of acute illness/appendix abscess Overall better but with some intermittent delirium. CT head done on 1/22 at family request-no acute abnormality seen. Maintain delirium precautions  HFrEF  Euvolemic Volume removal with HD  HLD Continue statin  HTN BP better-amlodipine adjusted 1/18 Continue  BiDil/Coreg.   DM-2 (A1c 6.2 on 1/8) CBGs stable with SSI  Recent Labs    08/16/22 1812 08/16/22 2225 08/17/22 0704  GLUCAP 168* 222* 183*    Hypokalemia Replete/recheck  Neuropathic pain Rest  pain associated PAD Neurontin  Anxiety Continue with BuSpar as needed  Asthma COPD overlap No wheezing Continue bronchodilators  Complex right ovarian mass Outpatient GYN follow-up-patient/spouse aware of this recommendation-potential malignancy  Left upper pole renal lesion Concern for malignancy-needs outpatient follow-up with urology for further workup  Debility/deconditioning PT/OT eval Home health recommended  Nutrition Status: Nutrition Problem: Moderate Malnutrition Etiology: chronic illness Signs/Symptoms: severe muscle depletion, moderate fat depletion Interventions: Nepro shake, MVI, Liberalize Diet  BMI: Estimated body mass index is 21.2 kg/m as calculated from the following:   Height as of this encounter: '5\' 5"'$  (1.651 m).   Weight as of this encounter: 57.8 kg.   Code status:   Code Status: Full Code   DVT Prophylaxis: SCDs Start: 08/12/2022 2213 IV heparin   Family Communication: Son [phone]-left voicemail on 1/18   Disposition Plan: Status is: Inpatient Remains inpatient appropriate because: Severity of illness.   Planned Discharge Destination:Home health   Diet: Diet Order             Diet regular Room service appropriate? No; Fluid consistency: Thin; Fluid restriction: 1200 mL Fluid  Diet effective now                     Antimicrobial agents: Anti-infectives (From admission, onward)    Start     Dose/Rate Route Frequency Ordered Stop   08/10/22 1400  cefTRIAXone (ROCEPHIN) 2 g in sodium chloride 0.9 % 100 mL IVPB        2 g 200 mL/hr over 30 Minutes Intravenous Every 24 hours 08/10/22 1025     08/10/22 1030  metroNIDAZOLE (FLAGYL) tablet 500 mg        500 mg Oral Every 12 hours 08/10/22 1025     08/08/22 1545   piperacillin-tazobactam (ZOSYN) IVPB 2.25 g  Status:  Discontinued        2.25 g 100 mL/hr over 30 Minutes Intravenous Every 8 hours 08/08/22 1455 08/10/22 1025   08/08/22 1415  ceFAZolin (ANCEF) IVPB 1 g/50 mL premix  Status:  Discontinued        1 g 100 mL/hr over 30 Minutes Intravenous Daily 08/08/22 1327 08/08/22 1454   07/29/2022 2245  piperacillin-tazobactam (ZOSYN) IVPB 2.25 g  Status:  Discontinued        2.25 g 100 mL/hr over 30 Minutes Intravenous Every 8 hours 08/03/2022 2215 08/08/22 1327   08/04/2022 1900  piperacillin-tazobactam (ZOSYN) IVPB 3.375 g        3.375 g 100 mL/hr over 30 Minutes Intravenous  Once 08/13/2022 1853 08/16/2022 2049        MEDICATIONS: Scheduled Meds:  acidophilus  1 capsule Oral Daily   amLODipine  10 mg Oral QHS   atorvastatin  40 mg Oral QHS   carvedilol  12.5 mg Oral Q M,W,F-HD   carvedilol  12.5 mg Oral 2 times per day on Tue Thu Sat   Chlorhexidine Gluconate Cloth  6 each Topical Q0600   doxercalciferol  7 mcg Intravenous Q M,W,F-HD   feeding supplement (NEPRO CARB STEADY)  237 mL Oral BID BM   ferric citrate  420 mg Oral TID WC   fluticasone furoate-vilanterol  1 puff Inhalation Daily   gabapentin  100 mg Oral QHS   Gerhardt's butt cream   Topical TID   insulin aspart  0-6 Units Subcutaneous TID WC   isosorbide-hydrALAZINE  1 tablet Oral 2 times per day on Sun Tue Thu Sat   isosorbide-hydrALAZINE  1 tablet Oral Q M,W,F-HD   metroNIDAZOLE  500 mg Oral Q12H   multivitamin  1 tablet Oral QHS   pantoprazole  40 mg Oral BID   potassium chloride  20 mEq Oral Once   sodium chloride flush  5 mL Intracatheter Q8H   ursodiol  300 mg Oral BID   Continuous Infusions:  anticoagulant sodium citrate     cefTRIAXone (ROCEPHIN)  IV Stopped (08/16/22 2001)   heparin 750 Units/hr (08/17/22 0600)   PRN Meds:.albuterol, alteplase, anticoagulant sodium citrate, busPIRone, fluticasone, heparin, heparin, hydrALAZINE, lidocaine (PF), lidocaine-prilocaine,  ondansetron (ZOFRAN) IV, ondansetron, oxyCODONE, pentafluoroprop-tetrafluoroeth, prochlorperazine, traZODone   I have personally reviewed following labs and imaging studies  LABORATORY DATA: CBC: Recent Labs  Lab 08/15/22 0457 08/16/22 0327 08/16/22 0650 08/17/22 0237 08/17/22 0715  WBC 28.5* QUANTITY NOT SUFFICIENT, UNABLE TO PERFORM TEST 23.4* 22.8* 24.9*  NEUTROABS  --  QUANTITY NOT SUFFICIENT, UNABLE TO PERFORM TEST RECOLLECT CBCD  CALLED TO TANYA DECAREAUS RN 0345 BTAYLOR 18.8*  --   --   HGB 11.7* QUANTITY NOT SUFFICIENT, UNABLE TO PERFORM TEST 10.5* 11.0* 10.6*  HCT 34.2* QUANTITY NOT SUFFICIENT, UNABLE TO PERFORM TEST 32.5* 32.5* 32.5*  MCV 94.0 QUANTITY NOT SUFFICIENT, UNABLE TO PERFORM TEST 96.7 94.8 96.2  PLT 365 QUANTITY NOT SUFFICIENT, UNABLE TO PERFORM TEST 318 288 329     Basic Metabolic Panel: Recent Labs  Lab 08/12/22 1432 08/13/22 0329 08/16/22 0327 08/17/22 0237 08/17/22 0715  NA 122* 132* 131* 131* 133*  K 2.5* 3.3* 3.0* 3.4* 3.9  CL 84* 93* 95* 93* 94*  CO2 21* '24 22 23 22  '$ GLUCOSE 251* 148* 182* 172* 171*  BUN 37* '11 11 16 18  '$ CREATININE 6.50* 2.92* 3.91* 4.99* 5.09*  CALCIUM 9.6 9.3 9.8 10.1 10.1  PHOS 4.4 2.6 3.6 4.0 4.2     GFR: Estimated Creatinine Clearance: 9.1 mL/min (A) (by C-G formula based on SCr of 5.09 mg/dL (H)).  Liver Function Tests: Recent Labs  Lab 08/12/22 1432 08/13/22 0329 08/16/22 0327 08/17/22 0237 08/17/22 0715  ALBUMIN 2.0* 2.1* 2.0* 2.0* 2.0*    No results for input(s): "LIPASE", "AMYLASE" in the last 168 hours. No results for input(s): "AMMONIA" in the last 168 hours.  Coagulation Profile: No results for input(s): "INR", "PROTIME" in the last 168 hours.  Cardiac Enzymes: No results for input(s): "CKTOTAL", "CKMB", "CKMBINDEX", "TROPONINI" in the last 168 hours.  BNP (last 3 results) No results for input(s): "PROBNP" in the last 8760 hours.  Lipid Profile: No results for input(s): "CHOL", "HDL",  "LDLCALC", "TRIG", "CHOLHDL", "LDLDIRECT" in the last 72 hours.  Thyroid Function Tests: No results for input(s): "TSH", "T4TOTAL", "FREET4", "T3FREE", "THYROIDAB" in the last 72 hours.  Anemia Panel: No results for input(s): "VITAMINB12", "FOLATE", "FERRITIN", "TIBC", "IRON", "RETICCTPCT" in the last 72 hours.  Urine analysis:    Component Value Date/Time   COLORURINE YELLOW 07/07/2016 0914   APPEARANCEUR CLOUDY (A) 07/07/2016 0914   LABSPEC >1.030 (H) 07/07/2016 0914   PHURINE 5.5 07/07/2016 0914   GLUCOSEU NEGATIVE 07/07/2016 0914   HGBUR SMALL (A) 07/07/2016 0914   BILIRUBINUR SMALL (A) 07/07/2016 0914   KETONESUR 15 (A) 07/07/2016 0914   PROTEINUR >  300 (A) 07/07/2016 0914   UROBILINOGEN 0.2 07/30/2014 2000   NITRITE NEGATIVE 07/07/2016 0914   LEUKOCYTESUR SMALL (A) 07/07/2016 0914    Sepsis Labs: Lactic Acid, Venous    Component Value Date/Time   LATICACIDVEN 1.2 08/10/2022 0724    MICROBIOLOGY: No results found for this or any previous visit (from the past 240 hour(s)).   RADIOLOGY STUDIES/RESULTS: CT HEAD WO CONTRAST (5MM)  Result Date: 08/17/2022 CLINICAL DATA:  Altered mental status EXAM: CT HEAD WITHOUT CONTRAST TECHNIQUE: Contiguous axial images were obtained from the base of the skull through the vertex without intravenous contrast. RADIATION DOSE REDUCTION: This exam was performed according to the departmental dose-optimization program which includes automated exposure control, adjustment of the mA and/or kV according to patient size and/or use of iterative reconstruction technique. COMPARISON:  05/07/2018 FINDINGS: Brain: There is no mass, hemorrhage or extra-axial collection. There is generalized atrophy without lobar predilection. Hypodensity of the white matter is most commonly associated with chronic microvascular disease. Vascular: Atherosclerotic calcification of the vertebral and internal carotid arteries at the skull base. No abnormal hyperdensity of the  major intracranial arteries or dural venous sinuses. Skull: The visualized skull base, calvarium and extracranial soft tissues are normal. Sinuses/Orbits: No fluid levels or advanced mucosal thickening of the visualized paranasal sinuses. No mastoid or middle ear effusion. The orbits are normal. IMPRESSION: 1. No acute intracranial abnormality. 2. Generalized atrophy and findings of chronic microvascular disease. Electronically Signed   By: Ulyses Jarred M.D.   On: 08/17/2022 01:07     LOS: 15 days   Oren Binet, MD  [facility]    To contact the attending provider between 7A-7P or the covering provider during after hours 7P-7A, please log into the web site [email or web address] and access using universal  password for that web site. If you do not have the password, please call the hospital operator.  08/17/2022, 10:55 AM

## 2022-08-17 NOTE — Progress Notes (Signed)
[facility] Progress Note   Subjective:    Seen in HD unit. Tolerating UF so far. Speaks softly but alert, oriented. Feels nauseated. Denies cp/dyspnea.   Objective Vitals:   08/17/22 0650 08/17/22 0820 08/17/22 0842 08/17/22 0900  BP:  (!) 150/60 (!) 147/52 (!) 158/51  Pulse: 73 66 65 66  Resp: '16 14 14 13  '$ Temp:      TempSrc:      SpO2: 100% 100% 100% 100%  Weight:  57.8 kg    Height:       Physical Exam General:chronically ill appearing female in NAD, oriented but slow to respond Heart:RRR, no mrg Lungs:CTAB, nml WOB on RA Abdomen:soft, NT, RLQ drain in place Extremities:no LE edema, compression socks and braces in place Dialysis Access: LU AVG +b/t   Filed Weights   08/14/22 1853 08/17/22 0520 08/17/22 0820  Weight: 54.7 kg 58.5 kg 57.8 kg    Intake/Output Summary (Last 24 hours) at 08/17/2022 0905 Last data filed at 08/17/2022 0600 Gross per 24 hour  Intake 805.3 ml  Output 0 ml  Net 805.3 ml    Additional Objective Labs: Basic Metabolic Panel: Recent Labs  Lab 08/16/22 0327 08/17/22 0237 08/17/22 0715  NA 131* 131* 133*  K 3.0* 3.4* 3.9  CL 95* 93* 94*  CO2 '22 23 22  '$ GLUCOSE 182* 172* 171*  BUN '11 16 18  '$ CREATININE 3.91* 4.99* 5.09*  CALCIUM 9.8 10.1 10.1  PHOS 3.6 4.0 4.2    Liver Function Tests: Recent Labs  Lab 08/16/22 0327 08/17/22 0237 08/17/22 0715  ALBUMIN 2.0* 2.0* 2.0*    CBC: Recent Labs  Lab 08/15/22 0457 08/16/22 0327 08/16/22 0650 08/17/22 0237 08/17/22 0715  WBC 28.5* QUANTITY NOT SUFFICIENT, UNABLE TO PERFORM TEST 23.4* 22.8* 24.9*  NEUTROABS  --  QUANTITY NOT SUFFICIENT, UNABLE TO PERFORM TEST RECOLLECT CBCD  CALLED TO TANYA DECAREAUS RN 0345 BTAYLOR 18.8*  --   --   HGB 11.7* QUANTITY NOT SUFFICIENT, UNABLE TO PERFORM TEST 10.5* 11.0* 10.6*  HCT 34.2* QUANTITY NOT SUFFICIENT, UNABLE TO PERFORM TEST 32.5* 32.5* 32.5*  MCV 94.0 QUANTITY NOT SUFFICIENT, UNABLE TO PERFORM TEST 96.7 94.8 96.2  PLT 365  QUANTITY NOT SUFFICIENT, UNABLE TO PERFORM TEST 318 288 329    Blood Culture    Component Value Date/Time   SDES WOUND 08/03/2022 1326   SPECREQUEST  ABCESS ABDOMEN 08/03/2022 1326   CULT  08/03/2022 1326    ABUNDANT ESCHERICHIA COLI MODERATE KLEBSIELLA PNEUMONIAE ABUNDANT BACTEROIDES THETAIOTAOMICRON BETA LACTAMASE POSITIVE Performed at [facility], [address], [place], [place] [address]    REPTSTATUS 08/08/2022 FINAL 08/03/2022 1326    CBG: Recent Labs  Lab 08/16/22 0856 08/16/22 1302 08/16/22 1812 08/16/22 2225 08/17/22 0704  GLUCAP 168* 230* 168* 222* 183*    Medications:  anticoagulant sodium citrate     cefTRIAXone (ROCEPHIN)  IV Stopped (08/16/22 2001)   heparin 750 Units/hr (08/17/22 0600)    acidophilus  1 capsule Oral Daily   amLODipine  10 mg Oral QHS   atorvastatin  40 mg Oral QHS   carvedilol  12.5 mg Oral Q M,W,F-HD   carvedilol  12.5 mg Oral 2 times per day on Tue Thu Sat   Chlorhexidine Gluconate Cloth  6 each Topical Q0600   doxercalciferol  7 mcg Intravenous Q M,W,F-HD   feeding supplement (NEPRO CARB STEADY)  237 mL Oral BID BM   ferric citrate  420 mg Oral TID WC   fluticasone  furoate-vilanterol  1 puff Inhalation Daily   gabapentin  100 mg Oral QHS   Gerhardt's butt cream   Topical TID   insulin aspart  0-6 Units Subcutaneous TID WC   isosorbide-hydrALAZINE  1 tablet Oral 2 times per day on Sun Tue Thu Sat   isosorbide-hydrALAZINE  1 tablet Oral Q M,W,F-HD   metroNIDAZOLE  500 mg Oral Q12H   multivitamin  1 tablet Oral QHS   pantoprazole  40 mg Oral BID   potassium chloride  20 mEq Oral Once   sodium chloride flush  5 mL Intracatheter Q8H   ursodiol  300 mg Oral BID    Dialysis Orders: [place] MWF  4h  400/600   57.4kg   2/3.5 bath LUA AVG  Heparin 2500+ 1079mdrun - last HD 1/5, post wt 57.3kg - hectorol 7 ug IV tiw - venofer 50 q week - mircera 30 ug q4 wks, last 12/8, due now   Assessment/Plan: Abd pain/  intra-abdominal infection or abscess/perforated appendicitis - per CT imaging. Gen surg and IR consulted, started on IV abx. IR placed drain in RLQ fluid collection. Fluid sent for culture, growing E coli/ Klebsiella. Bacteroides. Per pmd. 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 No urgent role for surgery per notes. Plan for repeat scan early next week. Per surgery/IR.  PAD/critical limb ischemia w/rest pain - CTA on 1/15 w/occluded L fem-pop. On Heparin.  VVS consulting, if improvement noted in abscess on repeat CT, may proceed with endovascular intervention d/t concern for compromise of LE and expected long recovery time for abdominal abscess.   ESRD - on HD MWF. HD on schedule. Next HD 1/22-- 4K bath  Hypokalemia - diet liberalized but poor appetite. Encouraged nutrition. Supplements given.  HTN/ volume - Appears mostly euvolemic on exam.  Set UF goal 1-2L as tolerated. Bps have been high --Improved with resumed home amlodipine, carvedilol and bidil.  Hold pre HD d/t intra dialytic hypotension.  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 no ESA needed  Continue to monitor. If continues to drop will order ESA. MBD ckd - CCa elevated hold Hectorol, phos trending down, if continues to decrease will hold binder. On Auryxia when taking meals DM 2 - per pmd COPD/ asthma - no symptoms Encephalopahty: likely multifactorial.  Waxing and wanning. Stopped gabapentin. Ordered CT head to r/o stroke.  Nutrition - Alb low. Currently on regular diet.  Follow labs. If K^ will need to change to renal diet. Add protein supplements.   OLynnda ChildPA-C [facility] 08/17/2022,9:05 AM

## 2022-08-17 NOTE — Progress Notes (Signed)
ANTICOAGULATION CONSULT NOTE  Pharmacy Consult for Heparin Indication:  Limb ischemia     Allergies  Allergen Reactions   Adhesive  [Tape] Hives   Lactose Intolerance (Gi) Diarrhea   Lisinopril Cough   Prednisone Swelling and Other (See Comments)    Excessive fluid buildup    Patient Measurements: Height: '5\' 5"'$  (165.1 cm) Weight: 58.5 kg (128 lb 15.5 oz) IBW/kg (Calculated) : 57  Vital Signs: Temp: 98.2 F (36.8 C) (01/22 0420) Temp Source: Oral (01/22 0420) BP: 142/60 (01/22 0420) Pulse Rate: 74 (01/22 0420)  Labs: Recent Labs    08/15/22 0457 08/16/22 0327 08/16/22 0650 08/17/22 0237 08/17/22 0715  HGB 11.7* QUANTITY NOT SUFFICIENT, UNABLE TO PERFORM TEST 10.5* 11.0* 10.6*  HCT 34.2* QUANTITY NOT SUFFICIENT, UNABLE TO PERFORM TEST 32.5* 32.5* 32.5*  PLT 365 QUANTITY NOT SUFFICIENT, UNABLE TO PERFORM TEST 318 288 329  HEPARINUNFRC 0.45 0.49  --  0.46  --   CREATININE  --  3.91*  --  4.99*  --      Estimated Creatinine Clearance: 9.3 mL/min (A) (by C-G formula based on SCr of 4.99 mg/dL (H)).   Assessment: 72 year old female continues heparin for limb ischemia.  Planning to pursue revascularization once pelvic abscess has improved/resolved.   Heparin level therapeutic   Goal of Therapy:  Heparin level 0.3-0.7 units/ml Monitor platelets by anticoagulation protocol: Yes   Plan:  Continue Heparin at 750 units / hr Continue daily levels  Monitor for signs and symptoms of bleeding.   Thank you Anette Guarneri, PharmD

## 2022-08-17 NOTE — Progress Notes (Signed)
Received patient in bed to unit.  Alert and oriented.  Informed consent signed and in chart.   Treatment initiated: 08:42 Treatment completed: 12:49  Patient tolerated well.  Transported back to the room  Alert, without acute distress.  Hand-off given to patient's nurse.   Access used: left AVG Access issues: none  Total UF removed: 1.5L Medication(s) given: zofran     08/17/22 1249  Vitals  BP (!) 145/68  MAP (mmHg) 90  BP Location Right Arm  BP Method Automatic  Patient Position (if appropriate) Lying  Pulse Rate (!) 58  Pulse Rate Source Monitor  ECG Heart Rate 82  Resp (!) 28  Oxygen Therapy  SpO2 (!) 70 %  O2 Device Room Air  During Treatment Monitoring  HD Safety Checks Performed Yes  Intra-Hemodialysis Comments Tolerated well;Tx completed  Dialysis Fluid Bolus Normal Saline  Post Treatment  Dialyzer Clearance Clear  Duration of HD Treatment -hour(s) 4 hour(s)  Liters Processed 84  Fluid Removed (mL) 1500 mL  Tolerated HD Treatment Yes  AVG/AVF Arterial Site Held (minutes) 7 minutes  AVG/AVF Venous Site Held (minutes) 7 minutes  Fistula / Graft Left Upper arm Arteriovenous vein graft  Placement Date/Time: (c) 09/05/20 (c) 1259   Placed prior to admission: Yes  Orientation: Left  Access Location: Upper arm  Access Type: Arteriovenous vein graft  Site Condition No complications  Fistula / Graft Assessment Present;Thrill;Bruit  Status Deaccessed      Jahid Weida S [facility]

## 2022-08-17 NOTE — Progress Notes (Signed)
PT Cancellation Note  Patient Details Name: Destiny Day MRN: [id number] DOB: September 19, 1950   Cancelled Treatment:    Reason Eval/Treat Not Completed: Patient at procedure or test/unavailable Pt off floor at HD. Will follow.   [name] 08/17/2022, 9:55 AM Marisa Severin, PT, DPT Acute Rehabilitation Services Secure chat preferred Office [phone]

## 2022-08-17 NOTE — Progress Notes (Signed)
  Daily Progress Note  Subjective: Resting comfortably.  Appears to have some altered mental status  Objective: Vitals:   08/17/22 1230 08/17/22 1249  BP: (!) 139/59 (!) 145/68  Pulse: 78 (!) 58  Resp: 19 (!) 28  Temp:    SpO2: 99% (!) 70%    Physical Examination Drain in place.  Nonpalpable in the feet.  No wounds on the feet.   ASSESSMENT/PLAN:   Patient is a 72 year old female with multiple medical problems including pelvic abscess from perforated appendicitis currently with drain.  Critical limb ischemia with rest pain in the left leg.   I have followed her course now over the last week trying to determine optimal timing for left lower extremity angiography with possible intervention.  Judging from her previous imaging, Destiny Day will likely need both inflow stenting, as well as lower extremity redo bypass.  In both cases, prosthetic material will need to be implanted.  I am worried that doing this in the setting of an active infection could lead to seeding, which would be a significant problem requiring explantation.  I would appreciate a new CT for [facility] in an effort to visualize improvement/regression in the size of her pelvic abscess/appendiceal fistula.  On exam today, Destiny Day appeared to have some altered mental status.  It appears as though her pain is at least tolerable in the short-term.  I think that it hurts her most during dialysis due to fluid removal.  I plan to continue to follow her course.  Timing to angiography will be dependent on CT scan and further improvement.  The continued leukocytosis greater than 20 remains a concern. Recommend continuing heparin gtt and pain medication.    Cassandria Santee MD MS [facility] [phone] 08/17/2022  3:17 PM

## 2022-08-18 DIAGNOSIS — K651 Peritoneal abscess: Secondary | ICD-10-CM | POA: Diagnosis not present

## 2022-08-18 DIAGNOSIS — M79606 Pain in leg, unspecified: Secondary | ICD-10-CM | POA: Diagnosis not present

## 2022-08-18 DIAGNOSIS — E440 Moderate protein-calorie malnutrition: Secondary | ICD-10-CM | POA: Diagnosis not present

## 2022-08-18 DIAGNOSIS — K659 Peritonitis, unspecified: Secondary | ICD-10-CM | POA: Diagnosis not present

## 2022-08-18 LAB — CBC WITH DIFFERENTIAL/PLATELET
Abs Immature Granulocytes: 0.54 10*3/uL — ABNORMAL HIGH (ref 0.00–0.07)
Basophils Absolute: 0.1 10*3/uL (ref 0.0–0.1)
Basophils Relative: 1 %
Eosinophils Absolute: 0.1 10*3/uL (ref 0.0–0.5)
Eosinophils Relative: 1 %
HCT: 35.6 % — ABNORMAL LOW (ref 36.0–46.0)
Hemoglobin: 11.3 g/dL — ABNORMAL LOW (ref 12.0–15.0)
Immature Granulocytes: 3 %
Lymphocytes Relative: 8 %
Lymphs Abs: 1.8 10*3/uL (ref 0.7–4.0)
MCH: 31 pg (ref 26.0–34.0)
MCHC: 31.7 g/dL (ref 30.0–36.0)
MCV: 97.5 fL (ref 80.0–100.0)
Monocytes Absolute: 1.3 10*3/uL — ABNORMAL HIGH (ref 0.1–1.0)
Monocytes Relative: 6 %
Neutro Abs: 17.8 10*3/uL — ABNORMAL HIGH (ref 1.7–7.7)
Neutrophils Relative %: 81 %
Platelets: 338 10*3/uL (ref 150–400)
RBC: 3.65 MIL/uL — ABNORMAL LOW (ref 3.87–5.11)
RDW: 14.4 % (ref 11.5–15.5)
WBC: 21.7 10*3/uL — ABNORMAL HIGH (ref 4.0–10.5)
nRBC: 0.2 % (ref 0.0–0.2)

## 2022-08-18 LAB — HEPARIN LEVEL (UNFRACTIONATED): Heparin Unfractionated: 0.59 IU/mL (ref 0.30–0.70)

## 2022-08-18 LAB — GLUCOSE, CAPILLARY
Glucose-Capillary: 142 mg/dL — ABNORMAL HIGH (ref 70–99)
Glucose-Capillary: 154 mg/dL — ABNORMAL HIGH (ref 70–99)
Glucose-Capillary: 215 mg/dL — ABNORMAL HIGH (ref 70–99)
Glucose-Capillary: 140 mg/dL — ABNORMAL HIGH (ref 70–99)

## 2022-08-18 MED ORDER — IOHEXOL 9 MG/ML PO SOLN: 500.0000 mL | ORAL | Status: AC

## 2022-08-18 MED ORDER — IOHEXOL 9 MG/ML PO SOLN
ORAL | Status: AC
  Filled 2022-08-18: qty 1000

## 2022-08-18 NOTE — Progress Notes (Signed)
[facility] Progress Note   Subjective:    Seen in room. Trying to eat breakfast. No new complaints. Denies cp, dyspnea, abd pain. For repeat CT today.   Objective Vitals:   08/18/22 0200 08/18/22 0400 08/18/22 0600 08/18/22 0800  BP:  (!) 109/45  (!) 151/54  Pulse: 83 80 82 84  Resp: '17 11 12 18  '$ Temp:  97.9 F (36.6 C)  97.8 F (36.6 C)  TempSrc:  Oral  Axillary  SpO2: 100% 95% 96% 97%  Weight:      Height:       Physical Exam General:chronically ill appearing, nad  Heart:RRR, no mrg Lungs:CTAB, nml WOB on RA Abdomen:soft, NT, RLQ drain in place Extremities:no LE edema, compression socks and braces in place Dialysis Access: LU AVG +b/t   Filed Weights   08/17/22 0520 08/17/22 0820 08/17/22 1249  Weight: 58.5 kg 57.8 kg 54 kg    Intake/Output Summary (Last 24 hours) at 08/18/2022 1011 Last data filed at 08/17/2022 2000 Gross per 24 hour  Intake --  Output 1500 ml  Net -1500 ml     Additional Objective Labs: Basic Metabolic Panel: Recent Labs  Lab 08/16/22 0327 08/17/22 0237 08/17/22 0715  NA 131* 131* 133*  K 3.0* 3.4* 3.9  CL 95* 93* 94*  CO2 '22 23 22  '$ GLUCOSE 182* 172* 171*  BUN '11 16 18  '$ CREATININE 3.91* 4.99* 5.09*  CALCIUM 9.8 10.1 10.1  PHOS 3.6 4.0 4.2    Liver Function Tests: Recent Labs  Lab 08/16/22 0327 08/17/22 0237 08/17/22 0715  ALBUMIN 2.0* 2.0* 2.0*    CBC: Recent Labs  Lab 08/16/22 0327 08/16/22 0650 08/17/22 0237 08/17/22 0715 08/18/22 0727  WBC QUANTITY NOT SUFFICIENT, UNABLE TO PERFORM TEST 23.4* 22.8* 24.9* 21.7*  NEUTROABS QUANTITY NOT SUFFICIENT, UNABLE TO PERFORM TEST RECOLLECT CBCD  CALLED TO TANYA DECAREAUS RN [phone] [address]TA[address]OR 18.8*  --   --  17.8*  HGB QUANTITY NOT SUFFICIENT, UNABLE TO PERFORM TEST 10.5* 11.0* 10.6* 11.3*  HCT QUANTITY NOT SUFFICIENT, UNABLE TO PERFORM TEST 32.5* 32.5* 32.5* 35.6*  MCV QUANTITY NOT SUFFICIENT, UNABLE TO PERFORM TEST 96.7 94.8 96.2 97.5  PLT QUANTITY NOT  SUFFICIENT, UNABLE TO PERFORM TEST 318 288 329 338    Blood Culture    Component Value Date/Time   SDES WOUND 08/03/2022 1326   SPECREQUEST  ABCESS ABDOMEN 08/03/2022 1326   CULT  08/03/2022 1326    ABUNDANT ESCHERICHIA COLI MODERATE KLEBSIELLA PNEUMONIAE ABUNDANT BACTEROIDES THETAIOTAOMICRON BETA LACTAMASE POSITIVE Performed at [facility] [address] [place], [place] [address]    REPTSTATUS 08/08/2022 FINAL 08/03/2022 1326    CBG: Recent Labs  Lab 08/16/22 2225 08/17/22 0704 08/17/22 1610 08/17/22 2107 08/18/22 0831  GLUCAP 222* 183* 155* 145* 142*    Medications:  cefTRIAXone (ROCEPHIN)  IV 2 g (08/17/22 1709)   heparin 750 Units/hr (08/17/22 1703)    acidophilus  1 capsule Oral Daily   amLODipine  10 mg Oral QHS   atorvastatin  40 mg Oral QHS   carvedilol  12.5 mg Oral Q M,W,F-HD   carvedilol  12.5 mg Oral 2 times per day on Tue Thu Sat   Chlorhexidine Gluconate Cloth  6 each Topical Q0600   doxercalciferol  7 mcg Intravenous Q M,W,F-HD   feeding supplement (NEPRO CARB STEADY)  237 mL Oral BID BM   ferric citrate  420 mg Oral TID WC   fluticasone furoate-vilanterol  1 puff Inhalation Daily  gabapentin  100 mg Oral QHS   Gerhardt's butt cream   Topical TID   insulin aspart  0-6 Units Subcutaneous TID WC   iohexol       isosorbide-hydrALAZINE  1 tablet Oral 2 times per day on Sun Tue Thu Sat   isosorbide-hydrALAZINE  1 tablet Oral Q M,W,F-HD   metroNIDAZOLE  500 mg Oral Q12H   multivitamin  1 tablet Oral QHS   pantoprazole  40 mg Oral BID   potassium chloride  20 mEq Oral Once   sodium chloride flush  5 mL Intracatheter Q8H   ursodiol  300 mg Oral BID    Dialysis Orders: [facility] MWF  4h  400/600   57.4kg   2/3.5 bath LUA AVG  Heparin 2500+ 1039mdrun - last HD 1/5, post wt 57.3kg - hectorol 7 ug IV tiw - venofer 50 q week - mircera 30 ug q4 wks, last 12/8, due now   Assessment/Plan: Abd pain/ intra-abdominal infection or  abscess/perforated appendicitis - per CT imaging. Gen surg and IR consulted, started on IV abx. IR placed drain in RLQ fluid collection. Fluid sent for culture, growing E coli/ Klebsiella. Bacteroides. Per pmd. 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 Plan for repeat scan this week. Per surgery/IR.  PAD/critical limb ischemia w/rest pain - CTA on 1/15 w/occluded L fem-pop. On Heparin.  VVS consulting, if improvement noted in abscess on repeat CT, may proceed with endovascular intervention d/t concern for compromise of LE and expected long recovery time for abdominal abscess.   ESRD - on HD MWF. HD on schedule. Next HD 1/24 Hypokalemia - diet liberalized but poor appetite. Encouraged nutrition. Supplements given. Improved.  HTN/ volume - Appears mostly euvolemic on exam.  Set UF goal 1-2L as tolerated. Bps have been high --Improved with resumed home amlodipine, carvedilol and bidil.  Hold pre HD d/t intra dialytic hypotension.  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 no ESA needed   MBD ckd - CCa elevated hold Hectorol, Phos in range.On Auryxia when taking meals DM 2 - per pmd COPD/ asthma - no symptoms Encephalopahty: likely multifactorial.  Waxing and wanning. Stopped gabapentin. Ordered CT head to r/o stroke.  Nutrition - Alb low. Currently on regular diet.  Follow labs. If K^ will need to change to renal diet. Add protein supplements.   OLynnda ChildPA-C [facility] 08/18/2022,10:11 AM

## 2022-08-18 NOTE — Progress Notes (Signed)
Occupational Therapy Treatment Patient Details Name: Destiny Day MRN: [id number] DOB: Mar 02, 1951 Today's Date: 08/18/2022   History of present illness 72 year old female admitted 1/7  to the hospital with few days of right lower quadrant abdominal pain.  Imaging in the ED concerning for perforated appendicitis with a periappendiceal abscess with probably secondary involvement of the previously noted adnexal mass or a tubo-ovarian abscess.  She was placed on antibiotics, general surgery was consulted and she was admitted to the hospital. JP drain right lower quadrant placed.  PMH: ESRD on MWF HD, asthma/COPD, diastolic CHF, HTN, HLD, DM2   OT comments  Patient is poor progress toward patient focused goals this date.  Noted increased confusion and difficulty maintaining LOA for self feeding.  Patient not initiating movements/bed mobility as before, and needing increasing assist compared to prior session.  Patient is much more lethargic, and discharge disposition changed to possible SNF level rehab.  OT will continue efforts in the acute setting to maximize functional status.     Recommendations for follow up therapy are one component of a multi-disciplinary discharge planning process, led by the attending physician.  Recommendations may be updated based on patient status, additional functional criteria and insurance authorization.    Follow Up Recommendations  Other (comment) (patient with decline in function: question SNF vs HH with assist as needed and HH OT)     Assistance Recommended at Discharge Frequent or constant Supervision/Assistance  Patient can return home with the following  A lot of help with walking and/or transfers;A lot of help with bathing/dressing/bathroom;Assistance with cooking/housework;Direct supervision/assist for medications management;Direct supervision/assist for financial management;Assist for transportation;Help with stairs or ramp for entrance   Equipment  Recommendations  BSC/3in1;Tub/shower seat    Recommendations for Other Services      Precautions / Restrictions Precautions Precautions: Fall Precaution Comments: drain right lower quadrant Required Braces or Orthoses: Other Brace Other Brace: Pt has bil shoes with metal upright braces Restrictions Weight Bearing Restrictions: No       Mobility Bed Mobility Overal bed mobility: Needs Assistance             General bed mobility comments: max A to reposition in bed.  Not initiating or assisting with movements.    Transfers                                                                        Cognition Arousal/Alertness: Lethargic Behavior During Therapy: Flat affect Overall Cognitive Status: Difficult to assess                                                             Pertinent Vitals/ Pain       Pain Assessment Pain Assessment: No/denies pain Pain Intervention(s): Monitored during session  Frequency  Min 2X/week        Progress Toward Goals  OT Goals(current goals can now be found in the care plan section)  Progress towards OT goals: Not progressing toward goals - comment (Increased confusion and lethargy)  Acute Rehab OT Goals OT Goal Formulation: With patient Time For Goal Achievement: 08/26/22 Potential to Achieve Goals: [place] Discharge plan needs to be updated    Co-evaluation                 AM-PAC OT "6 Clicks" Daily Activity     Outcome Measure   Help from another person eating meals?: A Lot Help from another person taking care of personal grooming?: A Lot Help from another person toileting, which includes using toliet, bedpan, or urinal?: A Lot Help from another person bathing (including washing, rinsing, drying)?: A Lot Help from another person to put on and taking off regular upper  body clothing?: A Lot Help from another person to put on and taking off regular lower body clothing?: A Lot 6 Click Score: 12    End of Session Equipment Utilized During Treatment: Gait belt;Rolling walker (2 wheels)  OT Visit Diagnosis: Unsteadiness on feet (R26.81);Other abnormalities of gait and mobility (R26.89);Muscle weakness (generalized) (M62.81);Other symptoms and signs involving cognitive function   Activity Tolerance Patient tolerated treatment well   Patient Left in bed;with call bell/phone within reach;with bed alarm set   Nurse Communication Other (comment) (increased lethargy)        Time: 1329-1340 OT Time Calculation (min): 11 min  Charges: OT General Charges $OT Visit: 1 Visit OT Treatments $Self Care/Home Management : 8-22 mins  08/18/2022  RP, OTR/L  Acute Rehabilitation Services  Office:  [phone]   Metta Clines 08/18/2022, 1:46 PM

## 2022-08-18 NOTE — Progress Notes (Signed)
ANTICOAGULATION CONSULT NOTE  Pharmacy Consult for Heparin Indication:  Limb ischemia     Allergies  Allergen Reactions   Adhesive  [Tape] Hives   Lactose Intolerance (Gi) Diarrhea   Lisinopril Cough   Prednisone Swelling and Other (See Comments)    Excessive fluid buildup    Patient Measurements: Height: '5\' 5"'$  (165.1 cm) Weight: 54 kg (119 lb 0.8 oz) IBW/kg (Calculated) : 57  Vital Signs: Temp: 97.9 F (36.6 C) (01/23 0400) Temp Source: Oral (01/23 0400) BP: 151/54 (01/23 0800) Pulse Rate: 84 (01/23 0800)  Labs: Recent Labs    08/16/22 0327 08/16/22 0650 08/17/22 0237 08/17/22 0715 08/18/22 0727  HGB QUANTITY NOT SUFFICIENT, UNABLE TO PERFORM TEST   < > 11.0* 10.6* 11.3*  HCT QUANTITY NOT SUFFICIENT, UNABLE TO PERFORM TEST   < > 32.5* 32.5* 35.6*  PLT QUANTITY NOT SUFFICIENT, UNABLE TO PERFORM TEST   < > 288 329 338  HEPARINUNFRC 0.49  --  0.46  --  0.59  CREATININE 3.91*  --  4.99* 5.09*  --    < > = values in this interval not displayed.     Estimated Creatinine Clearance: 8.6 mL/min (A) (by C-G formula based on SCr of 5.09 mg/dL (H)).   Assessment: 72 year old female continues heparin for limb ischemia.  Planning to pursue revascularization once pelvic abscess has improved/resolved.   Heparin level therapeutic   Goal of Therapy:  Heparin level 0.3-0.7 units/ml Monitor platelets by anticoagulation protocol: Yes   Plan:  Continue heparin at 750 units / hr Continue daily levels  Monitor for signs and symptoms of bleeding.   Thank you Anette Guarneri, PharmD

## 2022-08-18 NOTE — Progress Notes (Addendum)
[facility] Progress Note     Subjective: CC-  Patient denies abdominal pain or buttock pain today. States she feels a little better. Denies nausea or emesis. +flatus. BMx2 yesterday. No family at bedside.  Objective: Vital signs in last 24 hours: Temp:  [97.8 F (36.6 C)-98.1 F (36.7 C)] 97.8 F (36.6 C) (01/23 0800) Pulse Rate:  [58-84] 84 (01/23 0800) Resp:  [11-28] 18 (01/23 0800) BP: (101-158)/(39-68) 151/54 (01/23 0800) SpO2:  [70 %-100 %] 97 % (01/23 0800) Weight:  [54 kg] 54 kg (01/22 1249) Last BM Date : 08/17/22  Intake/Output from previous day: 01/22 0701 - 01/23 0700 In: -  Out: 1500  Intake/Output this shift: No intake/output data recorded.  PE: Gen:  Alert, NAD Pulm:  rate and effort normal on room air Abd: soft, ND, mild tenderness around RUQ drain with minimal clear output in gravity bag  Lab Results:  Recent Labs    08/17/22 0715 08/18/22 0727  WBC 24.9* 21.7*  HGB 10.6* 11.3*  HCT 32.5* 35.6*  PLT 329 338   BMET Recent Labs    08/17/22 0237 08/17/22 0715  NA 131* 133*  K 3.4* 3.9  CL 93* 94*  CO2 23 22  GLUCOSE 172* 171*  BUN 16 18  CREATININE 4.99* 5.09*  CALCIUM 10.1 10.1   PT/INR No results for input(s): "LABPROT", "INR" in the last 72 hours. CMP     Component Value Date/Time   NA 133 (L) 08/17/2022 0715   NA 135 (A) 07/15/2016 0000   K 3.9 08/17/2022 0715   CL 94 (L) 08/17/2022 0715   CO2 22 08/17/2022 0715   GLUCOSE 171 (H) 08/17/2022 0715   BUN 18 08/17/2022 0715   BUN 30 (A) 07/15/2016 0000   CREATININE 5.09 (H) 08/17/2022 0715   CALCIUM 10.1 08/17/2022 0715   PROT 6.0 (L) 08/05/2022 0403   ALBUMIN 2.0 (L) 08/17/2022 0715   AST 15 08/05/2022 0403   ALT 10 08/05/2022 0403   ALKPHOS 73 08/05/2022 0403   BILITOT 0.5 08/05/2022 0403   GFRNONAA 9 (L) 08/17/2022 0715   GFRAA 6 (L) 06/26/2019 0500   Lipase     Component Value Date/Time   LIPASE 32 08/18/2022 1730       Studies/Results: CT HEAD  WO CONTRAST (5MM)  Result Date: 08/17/2022 CLINICAL DATA:  Altered mental status EXAM: CT HEAD WITHOUT CONTRAST TECHNIQUE: Contiguous axial images were obtained from the base of the skull through the vertex without intravenous contrast. RADIATION DOSE REDUCTION: This exam was performed according to the departmental dose-optimization program which includes automated exposure control, adjustment of the mA and/or kV according to patient size and/or use of iterative reconstruction technique. COMPARISON:  05/07/2018 FINDINGS: Brain: There is no mass, hemorrhage or extra-axial collection. There is generalized atrophy without lobar predilection. Hypodensity of the white matter is most commonly associated with chronic microvascular disease. Vascular: Atherosclerotic calcification of the vertebral and internal carotid arteries at the skull base. No abnormal hyperdensity of the major intracranial arteries or dural venous sinuses. Skull: The visualized skull base, calvarium and extracranial soft tissues are normal. Sinuses/Orbits: No fluid levels or advanced mucosal thickening of the visualized paranasal sinuses. No mastoid or middle ear effusion. The orbits are normal. IMPRESSION: 1. No acute intracranial abnormality. 2. Generalized atrophy and findings of chronic microvascular disease. Electronically Signed   By: Ulyses Jarred M.D.   On: 08/17/2022 01:07    Anti-infectives: Anti-infectives (From admission, onward)    Start  Dose/Rate Route Frequency Ordered Stop   08/10/22 1400  cefTRIAXone (ROCEPHIN) 2 g in sodium chloride 0.9 % 100 mL IVPB        2 g 200 mL/hr over 30 Minutes Intravenous Every 24 hours 08/10/22 1025     08/10/22 1030  metroNIDAZOLE (FLAGYL) tablet 500 mg        500 mg Oral Every 12 hours 08/10/22 1025     08/08/22 1545  piperacillin-tazobactam (ZOSYN) IVPB 2.25 g  Status:  Discontinued        2.25 g 100 mL/hr over 30 Minutes Intravenous Every 8 hours 08/08/22 1455 08/10/22 1025    08/08/22 1415  ceFAZolin (ANCEF) IVPB 1 g/50 mL premix  Status:  Discontinued        1 g 100 mL/hr over 30 Minutes Intravenous Daily 08/08/22 1327 08/08/22 1454   08/20/2022 2245  piperacillin-tazobactam (ZOSYN) IVPB 2.25 g  Status:  Discontinued        2.25 g 100 mL/hr over 30 Minutes Intravenous Every 8 hours 08/17/2022 2215 08/08/22 1327   08/25/2022 1900  piperacillin-tazobactam (ZOSYN) IVPB 3.375 g        3.375 g 100 mL/hr over 30 Minutes Intravenous  Once 08/12/2022 1853 08/01/2022 2049        Assessment/Plan Suspected perforated appendicitis with pelvic abscess - S/p IR drain placement 1/8. 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 To gravity drainage, no flushes. Hopefully fistula will close over time with drain in place. No urgent role for surgery. - WBC 21.  Afebrile. Drain with no output over the weekend. I was planning to order CT abdomen and pelvis tomorrow, but I see ID ordered for today. Will follow results.  - continue abx per ID   -ok from a general surgery standpoint for Vascular Surgery to do an A-gram and stents at their discretion.  Issue will remain when to address the perforated appendix and abscess in light of her need for an eventual lower extremity bypass which would likely require PTFE. Vascular following   FEN: Reg diet with fluid restriction 1275m, Nepro shakes ID: [id number] rocephin/flagyl per ID  VTE: SCD's, heparin gtt Foley: none   ESRD CHF HTN Neuropathy HTN OSA  I reviewed last 24 h vitals and pain scores, last 48 h intake and output, and last 24 h labs and trends.    LOS: 16 days    [facility] 08/18/2022, 8:43 AM Please see Amion for pager number during day hours 7:00am-4:30pm

## 2022-08-18 NOTE — Progress Notes (Signed)
Physical Therapy Treatment Patient Details Name: Destiny Day MRN: [id number] DOB: 17-Aug-1950 Today's Date: 08/18/2022   History of Present Illness 72 year old female admitted 1/7  to the hospital with few days of right lower quadrant abdominal pain.  Imaging in the ED concerning for perforated appendicitis with a periappendiceal abscess with probably secondary involvement of the previously noted adnexal mass or a tubo-ovarian abscess.  She was placed on antibiotics, general surgery was consulted and she was admitted to the hospital. JP drain right lower quadrant placed.  PMH: ESRD on MWF HD, asthma/COPD, diastolic CHF, HTN, HLD, DM2    PT Comments    Pt awake upon arrival of PT, agreeable to PT session. Pt with increased bed mobility for hygiene and clean-up but willing to perform OOB mobility afterwards. Pt required modA for supine>sit and sit<>stand trials, with verbal, visual, and tactile cues utilized for pt to achieve full upright standing. Pt able to side step along EOB with second standing attempt before fatiguing and ready to return to supine. Pt left in bed as she has orders for a CT scan today. Pt continues to benefit from skilled acute PT at this time to progress mobility, strength, balance, and activity tolerance. Depending on family support at her current functional level, pt may benefit from SNF at discharge if family is not comfortable providing the assist she needs, no family present today to discuss further. Acute PT will continue to follow as appropriate.     Recommendations for follow up therapy are one component of a multi-disciplinary discharge planning process, led by the attending physician.  Recommendations may be updated based on patient status, additional functional criteria and insurance authorization.  Follow Up Recommendations  Home health PT (HHaide, HHOT vs SNF pending future sessions)     Assistance Recommended at Discharge Frequent or constant  Supervision/Assistance  Patient can return home with the following A little help with walking and/or transfers;A little help with bathing/dressing/bathroom;Assistance with cooking/housework;Assist for transportation;Help with stairs or ramp for entrance   Equipment Recommendations  Rolling walker (2 wheels);Wheelchair (measurements PT);Wheelchair cushion (measurements PT)    Recommendations for Other Services       Precautions / Restrictions Precautions Precautions: Fall Precaution Comments: drain right lower quadrant Required Braces or Orthoses: Other Brace Other Brace: Pt has bil shoes with metal upright braces Restrictions Weight Bearing Restrictions: No     Mobility  Bed Mobility Overal bed mobility: Needs Assistance Bed Mobility: Rolling, Supine to Sit, Sit to Supine Rolling: Min assist   Supine to sit: Mod assist Sit to supine: Min assist   General bed mobility comments: pt requiring increased time for all bed mobility, rolling performed for hygiene, pt utilizing bedrails with mobility, minA required for rolling and sit>supine for BLE management, modA needed for supine>sit for trunk support    Transfers Overall transfer level: Needs assistance Equipment used: Rolling walker (2 wheels) Transfers: Sit to/from Stand Sit to Stand: Mod assist           General transfer comment: modA needed to stand x2 trials, tactile, verbal, and visual cues required for full upright posture in standing, pt able to achieve with second trial    Ambulation/Gait             Pre-gait activities: side steps along EOB with min-modA for RW management and sequencing     Stairs             Wheelchair Mobility    Modified Rankin (Stroke Patients Only)  Balance Overall balance assessment: Needs assistance Sitting-balance support: Bilateral upper extremity supported, Feet supported Sitting balance-Leahy Scale: Fair Sitting balance - Comments: utilizing BUE for  balance while seated EOB, close guard provided for safety   Standing balance support: Bilateral upper extremity supported, Reliant on assistive device for balance Standing balance-Leahy Scale: Poor Standing balance comment: pt with anterior lean and increased hip flexion with first standing trial, able to stand with full upright posture for second attempt and take side steps along EOB with minA                            Cognition Arousal/Alertness: Awake/alert Behavior During Therapy: Flat affect Overall Cognitive Status: Difficult to assess                         Following Commands: Follows one step commands with increased time Safety/Judgement: Decreased awareness of deficits   Problem Solving: Slow processing, Decreased initiation, Difficulty sequencing, Requires verbal cues, Requires tactile cues General Comments: pt more alert during today's session but requires increased time with all tasks        Exercises      General Comments General comments (skin integrity, edema, etc.): VSS throughout on room air      Pertinent Vitals/Pain Pain Assessment Pain Assessment: No/denies pain    Home Living                          Prior Function            PT Goals (current goals can now be found in the care plan section) Acute Rehab PT Goals Patient Stated Goal: to go home PT Goal Formulation: With patient/family Time For Goal Achievement: 08/22/22 Potential to Achieve Goals: Good Progress towards PT goals: Progressing toward goals    Frequency    Min 3X/week      PT Plan Current plan remains appropriate (if family okay with pt's current needs, may benefit from SNF stay vs HH)    Co-evaluation              AM-PAC PT "6 Clicks" Mobility   Outcome Measure  Help needed turning from your back to your side while in a flat bed without using bedrails?: A Little Help needed moving from lying on your back to sitting on the side of a  flat bed without using bedrails?: A Lot Help needed moving to and from a bed to a chair (including a wheelchair)?: A Lot Help needed standing up from a chair using your arms (e.g., wheelchair or bedside chair)?: A Lot Help needed to walk in hospital room?: A Lot Help needed climbing 3-5 steps with a railing? : Total 6 Click Score: 12    End of Session Equipment Utilized During Treatment: Gait belt Activity Tolerance: Patient limited by fatigue Patient left: with call bell/phone within reach;in bed;with bed alarm set Nurse Communication: Mobility status PT Visit Diagnosis: Muscle weakness (generalized) (M62.81)     Time: 5027-7412 PT Time Calculation (min) (ACUTE ONLY): 44 min  Charges:  $Therapeutic Activity: 38-52 mins                     Charlynne Cousins, PT DPT Acute Rehabilitation Services Office [phone]    Luvenia Heller 08/18/2022, 3:39 PM

## 2022-08-18 NOTE — Progress Notes (Addendum)
PROGRESS NOTE        PATIENT DETAILS Name: Destiny Day Age: 72 y.o. Sex: female Date of Birth: 1950-12-26 Admit Date: 08/14/2022 Admitting Physician Clance Boll, MD OYD:XAJOI, Rachel Moulds, NP  Brief Summary: Patient is a 72 y.o.  female with history of ESRD on HD MWF, chronic HFpEF, PAD-s/p bilateral TMA who presented with RLQ pain-found to have perforated appendicitis with abscess.  Hospital course complicated by worsening left lower extremity rest pain due to critical limb ischemia.  See below for further details.  Significant events: 1/07>> admitted TRH-RLQ pain-subsequently found to have perforated appendicitis with abscess.  Significant studies: 1/07>> RUQ ultrasound: Cholelithiasis with positive sonographic Murphy sign. 1/07>> CT abdomen/pelvis: Findings suspicious for perforated appendicitis with periappendiceal abscess 1/11>> CT abdomen/pelvis: Interval decrease in size of the right pelvic abscess-s/p percutaneous drainage. 1/15>> HIDA scan: No evidence of acute cholecystitis. 1/15>> CT angio abdominal aorta: Severe atherosclerotic disease throughout abdomen/pelvis and bilateral lower extremities.  Severe left outflow disease with occlusion of left femoral-popliteal bypass graft, occlusion of native left SFA.  Complex collection in the right hemipelvis-enlarged since 1/11 study. 1/22>> CT head:No acute abnormality.  Significant microbiology data: 1/08>> appendix abscess culture: E. coli/Klebsiella  Procedures: 1/08>> CT-guided drainage of right pelvic abscess 1/17>> drain check by IR  Consults: IR ID Vascular surgery General surgery Nephrology  Subjective: Some intermittent RLQ pain continues.  Some intermittent LLE rest pain continues.  Objective: Vitals: Blood pressure (!) 151/54, pulse 84, temperature 97.8 F (36.6 C), temperature source Axillary, resp. rate 18, height '5\' 5"'$  (1.651 m), weight 54 kg, SpO2 97 %.   Exam: Gen  Exam:Alert awake-not in any distress HEENT:atraumatic, normocephalic Chest: B/L clear to auscultation anteriorly CVS:S1S2 regular Abdomen:soft non tender, non distended Extremities:no edema-bilateral transmetatarsal amputation stump site looks benign. 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  Leukocytosis persistent but slowly downtrending Remains on empiric IV antibiotics Awaiting repeat CT abdomen CC/ID following-await further recommendations.   Critical left limb ischemia with rest pain PAD-s/p left femoral-popliteal bypass graft- CT angio with occluded left femoral to below-knee popliteal artery bypass Remains on IV heparin Appreciate vascular surgery following-recommendations are to pursue revascularization once pelvic abscess has resolved given risk of infection of prosthetic graft/stent.  ESRD on HD MWF Nephrology following and directing care  Hypokalemia Repleted.  Acute metabolic encephalopathy Hospital delirium In the setting of acute illness/appendix abscess Overall better but with some intermittent delirium. CT head done on 1/22 at family request-no acute abnormality seen. Maintain delirium precautions  HFrEF Euvolemic Volume removal with HD  HLD Continue statin  HTN BP better-amlodipine adjusted 1/18 Continue BiDil/Coreg.   DM-2  (A1c 6.2 on 1/8) CBGs stable with SSI  Recent Labs    08/17/22 1610 08/17/22 2107 08/18/22 0831  GLUCAP 155* 145* 142*    Hypokalemia Replete/recheck  Neuropathic pain Rest  pain associated PAD Neurontin  Anxiety Continue with BuSpar as needed  Asthma COPD overlap No wheezing Continue bronchodilators  Complex right ovarian mass Outpatient GYN follow-up-patient/spouse aware of this recommendation-potential malignancy  Left upper pole renal lesion Concern for malignancy-needs outpatient follow-up with urology for further workup  Debility/deconditioning PT/OT eval Home health recommended  Nutrition Status: Nutrition Problem: Moderate Malnutrition Etiology: chronic illness Signs/Symptoms: severe muscle depletion, moderate fat depletion Interventions: Nepro shake, MVI, Liberalize Diet  BMI: Estimated body mass index is 19.81 kg/m as calculated from the following:   Height as of this encounter: '5\' 5"'$  (1.651 m).   Weight as of this encounter: 54 kg.   Code status:   Code Status: Full Code   DVT Prophylaxis: SCDs Start: 08/10/2022 2213 IV heparin   Family Communication: Spouse Destiny Day-[phone] at bedside 1/23   Disposition Plan: Status is: Inpatient Remains inpatient appropriate because: Severity of illness.   Planned Discharge Destination:Home health   Diet: Diet Order             Diet regular Room service appropriate? No; Fluid consistency: Thin; Fluid restriction: 1200 mL Fluid  Diet effective now                     Antimicrobial agents: Anti-infectives (From admission, onward)    Start     Dose/Rate Route Frequency Ordered Stop   08/10/22 1400  cefTRIAXone (ROCEPHIN) 2 g in sodium chloride 0.9 % 100 mL IVPB        2 g 200 mL/hr over 30 Minutes Intravenous Every 24 hours 08/10/22 1025     08/10/22 1030  metroNIDAZOLE (FLAGYL) tablet 500 mg        500 mg Oral Every 12 hours 08/10/22 1025     08/08/22 1545  piperacillin-tazobactam  (ZOSYN) IVPB 2.25 g  Status:  Discontinued        2.25 g 100 mL/hr over 30 Minutes Intravenous Every 8 hours 08/08/22 1455 08/10/22 1025   08/08/22 1415  ceFAZolin (ANCEF) IVPB 1 g/50 mL premix  Status:  Discontinued        1 g 100 mL/hr over 30 Minutes Intravenous Daily 08/08/22 1327 08/08/22 1454   08/05/2022 2245  piperacillin-tazobactam (ZOSYN) IVPB 2.25 g  Status:  Discontinued        2.25 g 100 mL/hr over 30 Minutes Intravenous Every 8 hours 07/28/2022 2215 08/08/22 1327   08/25/2022 1900  piperacillin-tazobactam (ZOSYN) IVPB 3.375 g        3.375 g 100 mL/hr over 30 Minutes Intravenous  Once 08/21/2022 1853 08/11/2022 2049        MEDICATIONS: Scheduled Meds:  acidophilus  1 capsule Oral Daily   amLODipine  10 mg Oral QHS   atorvastatin  40 mg Oral QHS   carvedilol  12.5 mg Oral Q M,W,F-HD   carvedilol  12.5 mg Oral 2 times per day on Tue Thu Sat   Chlorhexidine Gluconate Cloth  6 each Topical Q0600   doxercalciferol  7 mcg Intravenous Q M,W,F-HD   feeding supplement (NEPRO CARB STEADY)  237 mL Oral BID BM   ferric citrate  420 mg Oral TID WC   fluticasone furoate-vilanterol  1 puff Inhalation Daily  gabapentin  100 mg Oral QHS   Gerhardt's butt cream   Topical TID   insulin aspart  0-6 Units Subcutaneous TID WC   iohexol       isosorbide-hydrALAZINE  1 tablet Oral 2 times per day on Sun Tue Thu Sat   isosorbide-hydrALAZINE  1 tablet Oral Q M,W,F-HD   metroNIDAZOLE  500 mg Oral Q12H   multivitamin  1 tablet Oral QHS   pantoprazole  40 mg Oral BID   potassium chloride  20 mEq Oral Once   sodium chloride flush  5 mL Intracatheter Q8H   ursodiol  300 mg Oral BID   Continuous Infusions:  cefTRIAXone (ROCEPHIN)  IV 2 g (08/17/22 1709)   heparin 750 Units/hr (08/17/22 1703)   PRN Meds:.albuterol, busPIRone, fluticasone, hydrALAZINE, iohexol, ondansetron (ZOFRAN) IV, ondansetron, oxyCODONE, prochlorperazine, traZODone   I have personally reviewed following labs and imaging  studies  LABORATORY DATA: CBC: Recent Labs  Lab 08/16/22 0327 08/16/22 0650 08/17/22 0237 08/17/22 0715 08/18/22 0727  WBC QUANTITY NOT SUFFICIENT, UNABLE TO PERFORM TEST 23.4* 22.8* 24.9* 21.7*  NEUTROABS QUANTITY NOT SUFFICIENT, UNABLE TO PERFORM TEST RECOLLECT CBCD  CALLED TO TANYA DECAREAUS RN 0345 BTAYLOR 18.8*  --   --  17.8*  HGB QUANTITY NOT SUFFICIENT, UNABLE TO PERFORM TEST 10.5* 11.0* 10.6* 11.3*  HCT QUANTITY NOT SUFFICIENT, UNABLE TO PERFORM TEST 32.5* 32.5* 32.5* 35.6*  MCV QUANTITY NOT SUFFICIENT, UNABLE TO PERFORM TEST 96.7 94.8 96.2 97.5  PLT QUANTITY NOT SUFFICIENT, UNABLE TO PERFORM TEST 318 288 329 338     Basic Metabolic Panel: Recent Labs  Lab 08/12/22 1432 08/13/22 0329 08/16/22 0327 08/17/22 0237 08/17/22 0715  NA 122* 132* 131* 131* 133*  K 2.5* 3.3* 3.0* 3.4* 3.9  CL 84* 93* 95* 93* 94*  CO2 21* '24 22 23 22  '$ GLUCOSE 251* 148* 182* 172* 171*  BUN 37* '11 11 16 18  '$ CREATININE 6.50* 2.92* 3.91* 4.99* 5.09*  CALCIUM 9.6 9.3 9.8 10.1 10.1  PHOS 4.4 2.6 3.6 4.0 4.2     GFR: Estimated Creatinine Clearance: 8.6 mL/min (A) (by C-G formula based on SCr of 5.09 mg/dL (H)).  Liver Function Tests: Recent Labs  Lab 08/12/22 1432 08/13/22 0329 08/16/22 0327 08/17/22 0237 08/17/22 0715  ALBUMIN 2.0* 2.1* 2.0* 2.0* 2.0*    No results for input(s): "LIPASE", "AMYLASE" in the last 168 hours. No results for input(s): "AMMONIA" in the last 168 hours.  Coagulation Profile: No results for input(s): "INR", "PROTIME" in the last 168 hours.  Cardiac Enzymes: No results for input(s): "CKTOTAL", "CKMB", "CKMBINDEX", "TROPONINI" in the last 168 hours.  BNP (last 3 results) No results for input(s): "PROBNP" in the last 8760 hours.  Lipid Profile: No results for input(s): "CHOL", "HDL", "LDLCALC", "TRIG", "CHOLHDL", "LDLDIRECT" in the last 72 hours.  Thyroid Function Tests: No results for input(s): "TSH", "T4TOTAL", "FREET4", "T3FREE", "THYROIDAB" in  the last 72 hours.  Anemia Panel: No results for input(s): "VITAMINB12", "FOLATE", "FERRITIN", "TIBC", "IRON", "RETICCTPCT" in the last 72 hours.  Urine analysis:    Component Value Date/Time   COLORURINE YELLOW 07/07/2016 0914   APPEARANCEUR CLOUDY (A) 07/07/2016 0914   LABSPEC >1.030 (H) 07/07/2016 0914   PHURINE 5.5 07/07/2016 0914   GLUCOSEU NEGATIVE 07/07/2016 0914   HGBUR SMALL (A) 07/07/2016 0914   BILIRUBINUR SMALL (A) 07/07/2016 0914   KETONESUR 15 (A) 07/07/2016 0914   PROTEINUR >300 (A) 07/07/2016 0914   UROBILINOGEN 0.2 07/30/2014 2000   NITRITE NEGATIVE 07/07/2016 0914  LEUKOCYTESUR SMALL (A) 07/07/2016 0914    Sepsis Labs: Lactic Acid, Venous    Component Value Date/Time   LATICACIDVEN 1.2 08/10/2022 0724    MICROBIOLOGY: No results found for this or any previous visit (from the past 240 hour(s)).   RADIOLOGY STUDIES/RESULTS: CT HEAD WO CONTRAST (5MM)  Result Date: 08/17/2022 CLINICAL DATA:  Altered mental status EXAM: CT HEAD WITHOUT CONTRAST TECHNIQUE: Contiguous axial images were obtained from the base of the skull through the vertex without intravenous contrast. RADIATION DOSE REDUCTION: This exam was performed according to the departmental dose-optimization program which includes automated exposure control, adjustment of the mA and/or kV according to patient size and/or use of iterative reconstruction technique. COMPARISON:  05/07/2018 FINDINGS: Brain: There is no mass, hemorrhage or extra-axial collection. There is generalized atrophy without lobar predilection. Hypodensity of the white matter is most commonly associated with chronic microvascular disease. Vascular: Atherosclerotic calcification of the vertebral and internal carotid arteries at the skull base. No abnormal hyperdensity of the major intracranial arteries or dural venous sinuses. Skull: The visualized skull base, calvarium and extracranial soft tissues are normal. Sinuses/Orbits: No fluid levels  or advanced mucosal thickening of the visualized paranasal sinuses. No mastoid or middle ear effusion. The orbits are normal. IMPRESSION: 1. No acute intracranial abnormality. 2. Generalized atrophy and findings of chronic microvascular disease. Electronically Signed   By: Ulyses Jarred M.D.   On: 08/17/2022 01:07     LOS: 16 days   Oren Binet, MD  [facility]    To contact the attending provider between 7A-7P or the covering provider during after hours 7P-7A, please log into the web site [email or web address] and access using universal [place] password for that web site. If you do not have the password, please call the hospital operator.  08/18/2022, 10:48 AM

## 2022-08-19 ENCOUNTER — Inpatient Hospital Stay (HOSPITAL_COMMUNITY): Payer: Medicare Other

## 2022-08-19 DIAGNOSIS — K651 Peritoneal abscess: Secondary | ICD-10-CM | POA: Diagnosis not present

## 2022-08-19 DIAGNOSIS — N186 End stage renal disease: Secondary | ICD-10-CM | POA: Diagnosis not present

## 2022-08-19 DIAGNOSIS — I10 Essential (primary) hypertension: Secondary | ICD-10-CM | POA: Diagnosis not present

## 2022-08-19 DIAGNOSIS — K659 Peritonitis, unspecified: Secondary | ICD-10-CM | POA: Diagnosis not present

## 2022-08-19 LAB — RENAL FUNCTION PANEL
Albumin: 2 g/dL — ABNORMAL LOW (ref 3.5–5.0)
Anion gap: 16 — ABNORMAL HIGH (ref 5–15)
BUN: 18 mg/dL (ref 8–23)
CO2: 24 mmol/L (ref 22–32)
Calcium: 10.2 mg/dL (ref 8.9–10.3)
Chloride: 94 mmol/L — ABNORMAL LOW (ref 98–111)
Creatinine, Ser: 4.8 mg/dL — ABNORMAL HIGH (ref 0.44–1.00)
GFR, Estimated: 9 mL/min — ABNORMAL LOW (ref >60)
Glucose, Bld: 173 mg/dL — ABNORMAL HIGH (ref 70–99)
Phosphorus: 4.2 mg/dL (ref 2.5–4.6)
Potassium: 2.6 mmol/L — CL (ref 3.5–5.1)
Sodium: 134 mmol/L — ABNORMAL LOW (ref 135–145)

## 2022-08-19 LAB — CBC
HCT: 32.1 % — ABNORMAL LOW (ref 36.0–46.0)
Hemoglobin: 10.1 g/dL — ABNORMAL LOW (ref 12.0–15.0)
MCH: 31.2 pg (ref 26.0–34.0)
MCHC: 31.5 g/dL (ref 30.0–36.0)
MCV: 99.1 fL (ref 80.0–100.0)
Platelets: 350 10*3/uL (ref 150–400)
RBC: 3.24 MIL/uL — ABNORMAL LOW (ref 3.87–5.11)
RDW: 14.6 % (ref 11.5–15.5)
WBC: 20.1 10*3/uL — ABNORMAL HIGH (ref 4.0–10.5)
nRBC: 0.1 % (ref 0.0–0.2)

## 2022-08-19 LAB — GLUCOSE, CAPILLARY
Glucose-Capillary: 193 mg/dL — ABNORMAL HIGH (ref 70–99)
Glucose-Capillary: 122 mg/dL — ABNORMAL HIGH (ref 70–99)
Glucose-Capillary: 157 mg/dL — ABNORMAL HIGH (ref 70–99)
Glucose-Capillary: 199 mg/dL — ABNORMAL HIGH (ref 70–99)

## 2022-08-19 LAB — HEPARIN LEVEL (UNFRACTIONATED): Heparin Unfractionated: 0.53 IU/mL (ref 0.30–0.70)

## 2022-08-19 MED ORDER — CEFAZOLIN SODIUM-DEXTROSE 2-4 GM/100ML-% IV SOLN: 2.0000 g | INTRAVENOUS | Status: DC

## 2022-08-19 MED ORDER — ENSURE ENLIVE PO LIQD
237.0000 mL | Freq: Two times a day (BID) | ORAL | Status: DC
  Administered 2022-08-20 – 2022-08-25 (×9): 237 mL via ORAL

## 2022-08-19 MED ORDER — LIDOCAINE HCL (PF) 1 % IJ SOLN: 5.0000 mL | INTRAMUSCULAR | Status: DC | PRN

## 2022-08-19 MED ORDER — HEPARIN SODIUM (PORCINE) 1000 UNIT/ML DIALYSIS: 2500.0000 [IU] | INTRAMUSCULAR | Status: DC | PRN

## 2022-08-19 MED ORDER — CEFAZOLIN SODIUM-DEXTROSE 1-4 GM/50ML-% IV SOLN
1.0000 g | Freq: Once | INTRAVENOUS | Status: AC
  Administered 2022-08-20: 1 g via INTRAVENOUS
  Filled 2022-08-19 (×2): qty 50

## 2022-08-19 MED ORDER — LIDOCAINE-PRILOCAINE 2.5-2.5 % EX CREA: 1.0000 | TOPICAL_CREAM | CUTANEOUS | Status: DC | PRN

## 2022-08-19 MED ORDER — PENTAFLUOROPROP-TETRAFLUOROETH EX AERO: 1.0000 | INHALATION_SPRAY | CUTANEOUS | Status: DC | PRN

## 2022-08-19 MED ORDER — CEFAZOLIN SODIUM-DEXTROSE 2-4 GM/100ML-% IV SOLN
2.0000 g | INTRAVENOUS | Status: DC
  Administered 2022-08-21 – 2022-08-26 (×3): 2 g via INTRAVENOUS
  Filled 2022-08-19 (×3): qty 100

## 2022-08-19 MED ORDER — HEPARIN SODIUM (PORCINE) 1000 UNIT/ML DIALYSIS: 1000.0000 [IU] | INTRAMUSCULAR | Status: DC | PRN

## 2022-08-19 MED ORDER — IOHEXOL 350 MG/ML SOLN
75.0000 mL | Freq: Once | INTRAVENOUS | Status: AC | PRN
  Administered 2022-08-19: 75 mL via INTRAVENOUS

## 2022-08-19 MED ORDER — ALTEPLASE 2 MG IJ SOLR: 2.0000 mg | Freq: Once | INTRAMUSCULAR | Status: DC | PRN

## 2022-08-19 MED ORDER — POTASSIUM CHLORIDE 20 MEQ PO PACK
20.0000 meq | PACK | Freq: Once | ORAL | Status: AC
  Administered 2022-08-19: 20 meq via ORAL
  Filled 2022-08-19: qty 1

## 2022-08-19 MED ORDER — ANTICOAGULANT SODIUM CITRATE 4% (200MG/5ML) IV SOLN: 5.0000 mL | Status: DC | PRN

## 2022-08-19 NOTE — Progress Notes (Signed)
ANTICOAGULATION CONSULT NOTE  Pharmacy Consult for Heparin Indication:  Limb ischemia     Allergies  Allergen Reactions   Adhesive  [Tape] Hives   Lactose Intolerance (Gi) Diarrhea   Lisinopril Cough   Prednisone Swelling and Other (See Comments)    Excessive fluid buildup    Patient Measurements: Height: '5\' 5"'$  (165.1 cm) Weight: 53.3 kg (117 lb 8.1 oz) IBW/kg (Calculated) : 57  Vital Signs: Temp: 97.5 F (36.4 C) (01/24 0830) Temp Source: Oral (01/24 0830) BP: 137/44 (01/24 1100) Pulse Rate: 60 (01/24 1030)  Labs: Recent Labs    08/17/22 0237 08/17/22 0715 08/18/22 0727 08/19/22 0749 08/19/22 0853  HGB 11.0* 10.6* 11.3*  --  10.1*  HCT 32.5* 32.5* 35.6*  --  32.1*  PLT 288 329 338  --  350  HEPARINUNFRC 0.46  --  0.59 0.53  --   CREATININE 4.99* 5.09*  --   --  4.80*     Estimated Creatinine Clearance: 9 mL/min (A) (by C-G formula based on SCr of 4.8 mg/dL (H)).   Assessment: 72 year old female continues heparin for limb ischemia.  Planning to pursue revascularization once pelvic abscess has improved/resolved.   Heparin level therapeutic   Goal of Therapy:  Heparin level 0.3-0.7 units/ml Monitor platelets by anticoagulation protocol: Yes   Plan:  Continue heparin at 750 units / hr Continue daily levels  Monitor for signs and symptoms of bleeding.   Thank you Anette Guarneri, PharmD

## 2022-08-19 NOTE — Progress Notes (Incomplete)
PHARMACY CONSULT NOTE FOR:  OUTPATIENT  PARENTERAL ANTIBIOTIC THERAPY (OPAT)  Indication:  Regimen:  End date:   IV antibiotic discharge orders are pended. To discharging provider:  please sign these orders via discharge navigator,  Select New Orders & click on the button choice - Manage This Unsigned Work.     Thank you for allowing pharmacy to be a part of this patient's care.  Lawson Radar 08/19/2022, 3:15 PM

## 2022-08-19 NOTE — Progress Notes (Signed)
[facility] Progress Note   Subjective:    Seen in [place]. Tolerating -UF goal 1L. Had some diarrhea again. Had repeat CT this am - no worsening or new finding.  AM labs pending.   Objective Vitals:   08/19/22 0759 08/19/22 0830 08/19/22 0846 08/19/22 0915  BP: (!) 131/47 (!) 121/43 (!) 131/52 (!) 111/56  Pulse: 76 76 77 76  Resp: (!) '9 17 14   '$ Temp: (!) 97.5 F (36.4 C) (!) 97.5 F (36.4 C)    TempSrc: Axillary Oral    SpO2: 94%  93% 92%  Weight:  54.1 kg    Height:       Physical Exam General:chronically ill appearing, nad  Heart:RRR, no mrg Lungs:CTAB, nml WOB on RA Abdomen:soft, NT, RLQ drain in place Extremities:no LE edema, compression socks and braces in place Dialysis Access: LU AVG +b/t   Filed Weights   08/17/22 1249 08/19/22 0500 08/19/22 0830  Weight: 54 kg 56 kg 54.1 kg   No intake or output data in the 24 hours ending 08/19/22 0920   Additional Objective Labs: Basic Metabolic Panel: Recent Labs  Lab 08/16/22 0327 08/17/22 0237 08/17/22 0715  NA 131* 131* 133*  K 3.0* 3.4* 3.9  CL 95* 93* 94*  CO2 '22 23 22  '$ GLUCOSE 182* 172* 171*  BUN '11 16 18  '$ CREATININE 3.91* 4.99* 5.09*  CALCIUM 9.8 10.1 10.1  PHOS 3.6 4.0 4.2    Liver Function Tests: Recent Labs  Lab 08/16/22 0327 08/17/22 0237 08/17/22 0715  ALBUMIN 2.0* 2.0* 2.0*    CBC: Recent Labs  Lab 08/16/22 0327 08/16/22 0650 08/17/22 0237 08/17/22 0715 08/18/22 0727  WBC QUANTITY NOT SUFFICIENT, UNABLE TO PERFORM TEST 23.4* 22.8* 24.9* 21.7*  NEUTROABS QUANTITY NOT SUFFICIENT, UNABLE TO PERFORM TEST RECOLLECT CBCD  CALLED TO TANYA DECAREAUS RN 0345 BTAYLOR 18.8*  --   --  17.8*  HGB QUANTITY NOT SUFFICIENT, UNABLE TO PERFORM TEST 10.5* 11.0* 10.6* 11.3*  HCT QUANTITY NOT SUFFICIENT, UNABLE TO PERFORM TEST 32.5* 32.5* 32.5* 35.6*  MCV QUANTITY NOT SUFFICIENT, UNABLE TO PERFORM TEST 96.7 94.8 96.2 97.5  PLT QUANTITY NOT SUFFICIENT, UNABLE TO PERFORM TEST 318 288 329 338     Blood Culture    Component Value Date/Time   SDES WOUND 08/03/2022 1326   SPECREQUEST  ABCESS ABDOMEN 08/03/2022 1326   CULT  08/03/2022 1326    ABUNDANT ESCHERICHIA COLI MODERATE KLEBSIELLA PNEUMONIAE ABUNDANT BACTEROIDES THETAIOTAOMICRON BETA LACTAMASE POSITIVE Performed at [facility], [address], [place], [place] [address]    REPTSTATUS 08/08/2022 FINAL 08/03/2022 1326    CBG: Recent Labs  Lab 08/18/22 0831 08/18/22 1206 08/18/22 1620 08/18/22 2115 08/19/22 0800  GLUCAP 142* 154* 215* 140* 193*    Medications:  anticoagulant sodium citrate     cefTRIAXone (ROCEPHIN)  IV Stopped (08/18/22 2237)   heparin 750 Units/hr (08/19/22 0126)    acidophilus  1 capsule Oral Daily   amLODipine  10 mg Oral QHS   atorvastatin  40 mg Oral QHS   carvedilol  12.5 mg Oral Q M,W,F-HD   carvedilol  12.5 mg Oral 2 times per day on Tue Thu Sat   Chlorhexidine Gluconate Cloth  6 each Topical Q0600   doxercalciferol  7 mcg Intravenous Q M,W,F-HD   feeding supplement (NEPRO CARB STEADY)  237 mL Oral BID BM   ferric citrate  420 mg Oral TID WC   fluticasone furoate-vilanterol  1 puff Inhalation Daily   gabapentin  100 mg Oral QHS   Gerhardt's butt cream   Topical TID   insulin aspart  0-6 Units Subcutaneous TID WC   isosorbide-hydrALAZINE  1 tablet Oral 2 times per day on Sun Tue Thu Sat   isosorbide-hydrALAZINE  1 tablet Oral Q M,W,F-HD   metroNIDAZOLE  500 mg Oral Q12H   multivitamin  1 tablet Oral QHS   pantoprazole  40 mg Oral BID   potassium chloride  20 mEq Oral Once   sodium chloride flush  5 mL Intracatheter Q8H   ursodiol  300 mg Oral BID    Dialysis Orders: [place] MWF  4h  400/600   57.4kg   2/3.5 bath LUA AVG  Heparin 2500+ 1043mdrun - last HD 1/5, post wt 57.3kg - hectorol 7 ug IV tiw - venofer 50 q week - mircera 30 ug q4 wks, last 12/8, due now   Assessment/Plan: Abd pain/ intra-abdominal infection or abscess/perforated appendicitis - per CT  imaging. Gen surg and IR consulted, started on IV abx. IR placed drain in RLQ fluid collection. Fluid sent for culture, growing E coli/ Klebsiella. Bacteroides. Per pmd. 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 Repeat CT 1/24 - no new findings.  Per surgery/IR.  PAD/critical limb ischemia w/rest pain - CTA on 1/15 w/occluded L fem-pop. On Heparin.  VVS consulting, if improvement noted in abscess on repeat CT, may proceed with endovascular intervention d/t concern for compromise of LE and expected long recovery time for abdominal abscess.   ESRD - on HD MWF. HD on schedule. HD today Hypokalemia - diet liberalized but poor appetite. Encouraged nutrition. Supplements given. Improved.  HTN/ volume - Appears mostly euvolemic on exam.  Set UF goal 1-2L as tolerated. Bps have been high --Improved with resumed home amlodipine, carvedilol and bidil.  Hold pre HD d/t intra dialytic hypotension.  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 no ESA needed   MBD ckd - CCa elevated hold Hectorol, Phos in range.On Auryxia when taking meals DM 2 - per pmd COPD/ asthma - no symptoms Encephalopahty: likely multifactorial.  Waxing and wanning. Stopped gabapentin. Ordered CT head to r/o stroke.  Nutrition - Alb low. Currently on regular diet.  Follow labs. If K^ will need to change to renal diet. Add protein supplements.   OLynnda ChildPA-C [facility] 08/19/2022,9:20 AM

## 2022-08-19 NOTE — Progress Notes (Signed)
Received patient in bed to unit.  Alert and oriented.  Informed consent signed and in chart.   Treatment initiated: 0846 Treatment completed: 1145  Patient tolerated well.  Transported back to the room  Alert, without acute distress.  Hand-off given to patient's nurse.   Access used: AVG Access issues: None  Total UF removed: 0.8L Medication(s) given: Hectorol, Oxy '5mg'$  Post HD VS: 518/98,42,10 Post HD weight: 53.3kg   [facility]

## 2022-08-19 NOTE — Progress Notes (Addendum)
PROGRESS NOTE        PATIENT DETAILS Name: Destiny Day Age: 72 y.o. Sex: female Date of Birth: 12-26-1950 Admit Date: 08/23/2022 Admitting Physician Clance Boll, MD [facility]:ENIDP, Rachel Moulds, NP  Brief Summary: Patient is a 72 y.o.  female with history of ESRD on HD MWF, chronic HFpEF, PAD-s/p bilateral TMA who presented with RLQ pain-found to have perforated appendicitis with abscess.  Hospital course complicated by worsening left lower extremity rest pain due to critical limb ischemia.  See below for further details.  Significant events: 1/07>> admitted TRH-RLQ pain-subsequently found to have perforated appendicitis with abscess.  Significant studies: 1/07>> RUQ ultrasound: Cholelithiasis with positive sonographic Murphy sign. 1/07>> CT abdomen/pelvis: Findings suspicious for perforated appendicitis with periappendiceal abscess 1/11>> CT abdomen/pelvis: Interval decrease in size of the right pelvic abscess-s/p percutaneous drainage. 1/15>> HIDA scan: No evidence of acute cholecystitis. 1/15>> CT angio abdominal aorta: Severe atherosclerotic disease throughout abdomen/pelvis and bilateral lower extremities.  Severe left outflow disease with occlusion of left femoral-popliteal bypass graft, occlusion of native left SFA.  Complex collection in the right hemipelvis-enlarged since 1/11 study. 1/22>> CT head:No acute abnormality 1/24>> CT abdomen/pelvis: Drainage catheter in place-in the region of complex abnormality in the right pelvic sidewall, residual component 4.9 x 5.3 x 4.7 cm.  No clinical worsening.  Significant microbiology data: 1/08>> appendix abscess culture: E. coli/Klebsiella  Procedures: 1/08>> CT-guided drainage of right pelvic abscess 1/17>> drain check by IR  Consults: IR ID Vascular surgery General surgery Nephrology  Subjective: Seen early in hemodialysis-unchanged-ongoing intermittent RLQ pain.  Some intermittent left lower  extremity pain.  Objective: Vitals: Blood pressure (!) 137/44, pulse 60, temperature (!) 97.5 F (36.4 C), temperature source Oral, resp. rate 13, height '5\' 5"'$  (1.651 m), weight 54.1 kg, SpO2 93 %.   Exam: Gen Exam:Alert awake-not in any distress HEENT:atraumatic, normocephalic Chest: B/L clear to auscultation anteriorly CVS:S1S2 regular Abdomen:soft non tender, non distended Extremities:no edema-bilateral TMA stump benign with Neurology: Non focal Skin: no rash  Pertinent Labs/Radiology:    Latest Ref Rng & Units 08/19/2022    8:53 AM 08/18/2022    7:27 AM 08/17/2022    7:15 AM  CBC  WBC 4.0 - 10.5 K/uL 20.1  21.7  24.9   Hemoglobin 12.0 - 15.0 g/dL 10.1  11.3  10.6   Hematocrit 36.0 - 46.0 % 32.1  35.6  32.5   Platelets 150 - 400 K/uL 350  338  329     Lab Results  Component Value Date   NA 134 (L) 08/19/2022   K 2.6 (LL) 08/19/2022   CL 94 (L) 08/19/2022   CO2 24 08/19/2022      Assessment/Plan: Perforated appendicitis with periappendiceal abscess-s/p CT-guided drain placed on 1/8 by IR 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   Leukocytosis persist but seems to be slowly downtrending  Repeat CT-with some residual collection but no new findings. Remains on empiric IV Rocephin/Flagyl Awaiting further recommendations from ID/CCS team.   Critical left limb ischemia with rest pain PAD-s/p left femoral-popliteal bypass graft- CT angio with occluded left femoral to below-knee popliteal artery bypass Remains on IV heparin Appreciate vascular surgery following-recommendations are to pursue revascularization once pelvic abscess has resolved given risk of infection of prosthetic graft/stent.  ESRD on  HD MWF Nephrology following and directing care  Hypokalemia Will discuss with nephrology to see if this can be repeated at HD. Addendum-nephrology placed on a high K bath for the  last hour of HD, and will supplement KCl today.  Acute metabolic encephalopathy Hospital delirium In the setting of acute illness/appendix abscess Overall better but with some intermittent delirium. CT head done on 1/22 at family request-no acute abnormality seen. Maintain delirium precautions  HFrEF Euvolemic Volume removal with HD  HLD Continue statin  HTN BP better-amlodipine adjusted 1/18 Continue BiDil/Coreg.   DM-2 (A1c 6.2 on 1/8) CBGs stable with SSI  Recent Labs    08/18/22 1620 08/18/22 2115 08/19/22 0800  GLUCAP 215* 140* 193*    Neuropathic pain Rest  pain associated PAD Neurontin  Anxiety Continue with BuSpar as needed  Asthma COPD overlap No wheezing Continue bronchodilators  Complex right ovarian mass Outpatient GYN follow-up-patient/spouse aware of this recommendation-potential malignancy  Left upper pole renal lesion Concern for malignancy-needs outpatient follow-up with urology for further workup  Debility/deconditioning PT/OT eval Home health recommended  Nutrition Status: Nutrition Problem: Moderate Malnutrition Etiology: chronic illness Signs/Symptoms: severe muscle depletion, moderate fat depletion Interventions: Nepro shake, MVI, Liberalize Diet  BMI: Estimated body mass index is 19.85 kg/m as calculated from the following:   Height as of this encounter: '5\' 5"'$  (1.651 m).   Weight as of this encounter: 54.1 kg.   Code status:   Code Status: Full Code   DVT Prophylaxis: SCDs Start: 07/29/2022 2213 IV heparin   Family Communication: Spouse [name]-[phone] updated 1/24   Disposition Plan: Status is: Inpatient Remains inpatient appropriate because: Severity of illness.   Planned Discharge Destination:Home health   Diet: Diet Order             Diet regular Room service appropriate? No; Fluid consistency: Thin; Fluid restriction: 1200 mL Fluid  Diet effective now                     Antimicrobial  agents: Anti-infectives (From admission, onward)    Start     Dose/Rate Route Frequency Ordered Stop   08/10/22 1400  cefTRIAXone (ROCEPHIN) 2 g in sodium chloride 0.9 % 100 mL IVPB        2 g 200 mL/hr over 30 Minutes Intravenous Every 24 hours 08/10/22 1025     08/10/22 1030  metroNIDAZOLE (FLAGYL) tablet 500 mg        500 mg Oral Every 12 hours 08/10/22 1025     08/08/22 1545  piperacillin-tazobactam (ZOSYN) IVPB 2.25 g  Status:  Discontinued        2.25 g 100 mL/hr over 30 Minutes Intravenous Every 8 hours 08/08/22 1455 08/10/22 1025   08/08/22 1415  ceFAZolin (ANCEF) IVPB 1 g/50 mL premix  Status:  Discontinued        1 g 100 mL/hr over 30 Minutes Intravenous Daily 08/08/22 1327 08/08/22 1454   08/07/2022 2245  piperacillin-tazobactam (ZOSYN) IVPB 2.25 g  Status:  Discontinued        2.25 g 100 mL/hr over 30 Minutes Intravenous Every 8 hours 08/13/2022 2215 08/08/22 1327   07/27/2022 1900  piperacillin-tazobactam (ZOSYN) IVPB 3.375 g        3.375 g 100 mL/hr over 30 Minutes Intravenous  Once 07/30/2022 1853 08/25/2022 2049        MEDICATIONS: Scheduled Meds:  acidophilus  1 capsule Oral Daily   amLODipine  10 mg Oral QHS   atorvastatin  40 mg  Oral QHS   carvedilol  12.5 mg Oral Q M,W,F-HD   carvedilol  12.5 mg Oral 2 times per day on Tue Thu Sat   Chlorhexidine Gluconate Cloth  6 each Topical Q0600   doxercalciferol  7 mcg Intravenous Q M,W,F-HD   feeding supplement (NEPRO CARB STEADY)  237 mL Oral BID BM   ferric citrate  420 mg Oral TID WC   fluticasone furoate-vilanterol  1 puff Inhalation Daily   gabapentin  100 mg Oral QHS   Gerhardt's butt cream   Topical TID   insulin aspart  0-6 Units Subcutaneous TID WC   isosorbide-hydrALAZINE  1 tablet Oral 2 times per day on Sun Tue Thu Sat   isosorbide-hydrALAZINE  1 tablet Oral Q M,W,F-HD   metroNIDAZOLE  500 mg Oral Q12H   multivitamin  1 tablet Oral QHS   pantoprazole  40 mg Oral BID   potassium chloride  20 mEq Oral Once    sodium chloride flush  5 mL Intracatheter Q8H   ursodiol  300 mg Oral BID   Continuous Infusions:  anticoagulant sodium citrate     cefTRIAXone (ROCEPHIN)  IV Stopped (08/18/22 2237)   heparin 750 Units/hr (08/19/22 0126)   PRN Meds:.albuterol, alteplase, anticoagulant sodium citrate, busPIRone, fluticasone, heparin, heparin, hydrALAZINE, lidocaine (PF), lidocaine-prilocaine, ondansetron (ZOFRAN) IV, ondansetron, oxyCODONE, pentafluoroprop-tetrafluoroeth, prochlorperazine, traZODone   I have personally reviewed following labs and imaging studies  LABORATORY DATA: CBC: Recent Labs  Lab 08/16/22 0327 08/16/22 0650 08/17/22 0237 08/17/22 0715 08/18/22 0727 08/19/22 0853  WBC QUANTITY NOT SUFFICIENT, UNABLE TO PERFORM TEST 23.4* 22.8* 24.9* 21.7* 20.1*  NEUTROABS QUANTITY NOT SUFFICIENT, UNABLE TO PERFORM TEST RECOLLECT CBCD  CALLED TO TANYA DECAREAUS RN [phone] BTAYLOR 18.8*  --   --  17.8*  --   HGB QUANTITY NOT SUFFICIENT, UNABLE TO PERFORM TEST 10.5* 11.0* 10.6* 11.3* 10.1*  HCT QUANTITY NOT SUFFICIENT, UNABLE TO PERFORM TEST 32.5* 32.5* 32.5* 35.6* 32.1*  MCV QUANTITY NOT SUFFICIENT, UNABLE TO PERFORM TEST 96.7 94.8 96.2 97.5 99.1  PLT QUANTITY NOT SUFFICIENT, UNABLE TO PERFORM TEST 318 288 329 338 350     Basic Metabolic Panel: Recent Labs  Lab 08/13/22 0329 08/16/22 0327 08/17/22 0237 08/17/22 0715 08/19/22 0853  NA 132* 131* 131* 133* 134*  K 3.3* 3.0* 3.4* 3.9 2.6*  CL 93* 95* 93* 94* 94*  CO2 '24 22 23 22 24  '$ GLUCOSE 148* 182* 172* 171* 173*  BUN '11 11 16 18 18  '$ CREATININE 2.92* 3.91* 4.99* 5.09* 4.80*  CALCIUM 9.3 9.8 10.1 10.1 10.2  PHOS 2.6 3.6 4.0 4.2 4.2     GFR: Estimated Creatinine Clearance: 9.2 mL/min (A) (by C-G formula based on SCr of 4.8 mg/dL (H)).  Liver Function Tests: Recent Labs  Lab 08/13/22 0329 08/16/22 0327 08/17/22 0237 08/17/22 0715 08/19/22 0853  ALBUMIN 2.1* 2.0* 2.0* 2.0* 2.0*    No results for input(s): "LIPASE", "AMYLASE"  in the last 168 hours. No results for input(s): "AMMONIA" in the last 168 hours.  Coagulation Profile: No results for input(s): "INR", "PROTIME" in the last 168 hours.  Cardiac Enzymes: No results for input(s): "CKTOTAL", "CKMB", "CKMBINDEX", "TROPONINI" in the last 168 hours.  BNP (last 3 results) No results for input(s): "PROBNP" in the last 8760 hours.  Lipid Profile: No results for input(s): "CHOL", "HDL", "LDLCALC", "TRIG", "CHOLHDL", "LDLDIRECT" in the last 72 hours.  Thyroid Function Tests: No results for input(s): "TSH", "T4TOTAL", "FREET4", "T3FREE", "THYROIDAB" in the last 72 hours.  Anemia  Panel: No results for input(s): "VITAMINB12", "FOLATE", "FERRITIN", "TIBC", "IRON", "RETICCTPCT" in the last 72 hours.  Urine analysis:    Component Value Date/Time   COLORURINE YELLOW 07/07/2016 0914   APPEARANCEUR CLOUDY (A) 07/07/2016 0914   LABSPEC >1.030 (H) 07/07/2016 0914   PHURINE 5.5 07/07/2016 0914   GLUCOSEU NEGATIVE 07/07/2016 0914   HGBUR SMALL (A) 07/07/2016 0914   BILIRUBINUR SMALL (A) 07/07/2016 0914   KETONESUR 15 (A) 07/07/2016 0914   PROTEINUR >300 (A) 07/07/2016 0914   UROBILINOGEN 0.2 07/30/2014 2000   NITRITE NEGATIVE 07/07/2016 0914   LEUKOCYTESUR SMALL (A) 07/07/2016 0914    Sepsis Labs: Lactic Acid, Venous    Component Value Date/Time   LATICACIDVEN 1.2 08/10/2022 0724    MICROBIOLOGY: No results found for this or any previous visit (from the past 240 hour(s)).   RADIOLOGY STUDIES/RESULTS: CT ABDOMEN PELVIS W CONTRAST  Result Date: 08/19/2022 CLINICAL DATA:  Follow-up abscess drainage EXAM: CT ABDOMEN AND PELVIS WITH CONTRAST TECHNIQUE: Multidetector CT imaging of the abdomen and pelvis was performed using the standard protocol following bolus administration of intravenous contrast. RADIATION DOSE REDUCTION: This exam was performed according to the departmental dose-optimization program which includes automated exposure control, adjustment of  the mA and/or kV according to patient size and/or use of iterative reconstruction technique. CONTRAST:  17m OMNIPAQUE IOHEXOL 350 MG/ML SOLN COMPARISON:  08/06/2022.  08/23/2022. 10/15/2020 FINDINGS: Lower chest: Lung bases are clear. Hepatobiliary: Layering stones and sludge remain evident in the gallbladder. No focal liver parenchymal lesion. Pancreas: Normal Spleen: Normal Adrenals/Urinary Tract: Adrenal glands remain normal. No change in appearance of the kidneys. No hydronephrosis. Bladder is collapsed but unremarkable. Stomach/Bowel: Stomach and small intestine are normal. Drainage catheter remains in place in the region of a complex abnormality along the right pelvic sidewall. Residual component continues to measure approximately 4.9 x 5.3 x 4.7 cm. The majority of this probably relates to a chronic pelvic sidewall mass lesion. Previously there was speculation that there could have been superinfection due to the presence of gas on the study 08/17/2022. I think at this point it is very difficult to determine if 0 where seeing simply represents the residual mass lesion or if there is any residual inflammatory component. Certainly there is no worsening or new finding. Vascular/Lymphatic: Aortic atherosclerosis. No aneurysm. IVC is normal. Reproductive: Previous hysterectomy.  No other pelvic finding. Other: No free fluid or air. Musculoskeletal: Ordinary chronic spinal degenerative changes. IMPRESSION: 1. Drainage catheter remains in place in the region of a complex abnormality along the right pelvic sidewall. Residual component continues to measure approximately 4.9 x 5.3 x 4.7 cm. The majority of this probably relates to a chronic pelvic sidewall mass lesion. Previously there was speculation that there could have been superinfection due to the presence of gas on the study 08/09/2022. I think at this point it is very difficult to determine if there is any residual inflammatory component. Certainly there is no  worsening or new finding. 2. Layering stones and sludge in the gallbladder. 3. Aortic atherosclerosis. Aortic Atherosclerosis (ICD10-I70.0). Electronically Signed   By: MNelson ChimesM.D.   On: 08/19/2022 08:21     LOS: 165days   SOren Binet MD  [facility]    To contact the attending provider between 7A-7P or the covering provider during after hours 7P-7A, please log into the web site [email or web address] and access using universal [place] password for that web site. If you do not have the password, please call the hospital operator.  08/19/2022, 11:38 AM

## 2022-08-19 NOTE — Progress Notes (Signed)
Supervising Physician: Corrie Mckusick  Patient Status:  [facility] - In-pt  Chief Complaint:  Perforated appendicitis with abscess s/p drain placement 08/03/22 in IR by Dr. Earleen Newport.   Subjective:  Recently back from HD.  Complains of diarrhea and abdominal pain. Answers to or discusses little else.  Drain in place.   Allergies: Adhesive  [tape], Lactose intolerance (gi), Lisinopril, and Prednisone  Medications: Prior to Admission medications   Medication Sig Start Date End Date Taking? Authorizing Provider  albuterol (VENTOLIN HFA) 108 (90 Base) MCG/ACT inhaler Inhale 2 puffs into the lungs every 4 (four) hours as needed for wheezing or shortness of breath. 07/21/22  Yes Piontek, Erin, MD  amLODipine (NORVASC) 10 MG tablet Take 10 mg by mouth daily. 02/09/22  Yes [provider]  aspirin EC 81 MG tablet Take 81 mg by mouth daily.   Yes [provider]  atorvastatin (LIPITOR) 40 MG tablet Take 40 mg by mouth at bedtime. 07/17/21  Yes [provider]  b complex-vitamin c-folic acid (NEPHRO-VITE) 0.8 MG TABS tablet Take 1 tablet by mouth at bedtime.   Yes [provider]  busPIRone (BUSPAR) 7.5 MG tablet Take 7.5 mg by mouth daily as needed (anxiety). 10/29/19  Yes [provider]  carvedilol (COREG) 12.5 MG tablet Take 12.5 mg by mouth See admin instructions. Take 1 tablet (12.5 mg) by mouth once daily in the evening on Mondays, Wednesdays, & Fridays. (Dialysis) Take 1 tablet (12.5 mg) by mouth twice daily on Sundays, Tuesdays, Thursdays, & Saturdays. (Non Dialysis)   Yes [provider]  cinacalcet (SENSIPAR) 60 MG tablet Take 60 mg by mouth See admin instructions. Take 1 tablet (60 mg) by mouth in the evening on Mondays, Wednesdays, & Fridays (dialysis) Take 1 tablet (60 mg) by mouth twice daily on Sundays, Tuesdays, Thursdays, Saturdays. (non dialysis) 11/28/18  Yes [provider]  diphenhydrAMINE HCl (BENADRYL ALLERGY PO) Take 1  capsule by mouth every Monday, Wednesday, and Friday with hemodialysis. If needed to help with allergies   Yes [provider]  ferric citrate (AURYXIA) 1 GM 210 MG(Fe) tablet Take 210-420 mg by mouth See admin instructions. Take 2 tablets (420 mg) by mouth with each meal & take 1 tablet (210 mg) by mouth with snacks.   Yes [provider]  fluticasone (FLONASE) 50 MCG/ACT nasal spray Place 2 sprays into both nostrils daily as needed for allergies or rhinitis. 04/07/22  Yes Valentina Shaggy, MD  gabapentin (NEURONTIN) 100 MG capsule TAKE 1 CAPSULE (100 MG TOTAL) BY MOUTH AT BEDTIME. WHEN NECESSARY FOR NEUROPATHY PAIN Patient taking differently: Take 100 mg by mouth at bedtime as needed (When necessary for neuropathy pain). 12/13/18  Yes Newt Minion, MD  glipiZIDE (GLUCOTROL) 5 MG tablet Take 5 mg by mouth at bedtime.   Yes [provider]  isosorbide-hydrALAZINE (BIDIL) 20-37.5 MG tablet Take 1 tablet by mouth See admin instructions. hs on Monday,Wednesday,Friday (dialysis days) Tid on Tuesday,Thursday,Saturday,Sunday(non-dialysis days) Patient taking differently: Take 1 tablet by mouth See admin instructions. Take in the evening on Monday,Wednesday,Friday (dialysis days) BID on Tuesday,Thursday,Saturday,Sunday(non-dialysis days) 01/08/22  Yes Cantwell, Celeste C, PA-C  lidocaine-prilocaine (EMLA) cream Apply 1 Application topically as needed. 03/31/22  Yes Vanessa Kick, MD  loperamide (IMODIUM) 2 MG capsule Take 2 capsules (4 mg total) by mouth 3 (three) times daily as needed for diarrhea or loose stools. 08/04/21  Yes Bonnielee Haff, MD  Methoxy PEG-Epoetin Beta (MIRCERA IJ) Mircera 08/20/21 10/23/22 Yes [provider]  montelukast (SINGULAIR) 10 MG tablet TAKE 1 TABLET BY MOUTH EVERYDAY AT BEDTIME Patient taking differently: Take 10 mg by mouth at bedtime. 04/07/22  Yes Valentina Shaggy, MD  omeprazole (PRILOSEC) 20 MG capsule Take 20 mg by mouth daily as  needed (for acid reflux).   Yes [provider]  ondansetron (ZOFRAN) 4 MG tablet Take 1 tablet (4 mg total) by mouth every 6 (six) hours as needed for nausea. 05/22/19  Yes Aline August, MD  promethazine-dextromethorphan (PROMETHAZINE-DM) 6.25-15 MG/5ML syrup Take 5 mLs by mouth 4 (four) times daily as needed for cough. 07/21/22  Yes Piontek, Junie Panning, MD  traZODone (DESYREL) 50 MG tablet Take 25-50 mg by mouth at bedtime as needed for sleep. 10/29/19  Yes [provider]  ursodiol (ACTIGALL) 250 MG tablet Take 250 mg by mouth 2 (two) times daily. 07/28/22  Yes [provider]  budesonide-formoterol (SYMBICORT) 160-4.5 MCG/ACT inhaler Inhale 2 puffs into the lungs 2 (two) times daily. Patient not taking: Reported on 05/28/2022 04/07/22   Valentina Shaggy, MD  EPINEPHrine (AUVI-Q) 0.3 mg/0.3 mL IJ SOAJ injection Inject 0.3 mg into the muscle as needed for anaphylaxis. Patient not taking: Reported on 08/03/2022 12/24/20   Valentina Shaggy, MD  fluticasone-salmeterol (ADVAIR [facility]) [phone] MCG/ACT inhaler Inhale two puffs twice daily Patient not taking: Reported on 05/28/2022 12/24/20   Valentina Shaggy, MD  latanoprost (XALATAN) 0.005 % ophthalmic solution Place 1 drop into the left eye at bedtime. Patient not taking: Reported on 05/28/2022 11/25/21 11/25/22  Rankin, Clent Demark, MD  levocetirizine (XYZAL) 5 MG tablet Take 1 tablet (5 mg total) by mouth every evening. Patient not taking: Reported on 08/03/2022 04/07/22   Valentina Shaggy, MD  ofloxacin (OCUFLOX) 0.3 % ophthalmic solution Place 1 drop into the right eye 4 (four) times daily. Patient not taking: Reported on 08/03/2022 05/27/22   [provider]  prednisoLONE acetate (PRED FORTE) 1 % ophthalmic suspension Place 1 drop into the right eye 4 (four) times daily. Patient not taking: Reported on 08/03/2022 05/27/22   [provider]  Spacer/Aero-Holding Chambers (AEROCHAMBER PLUS) inhaler Use with inhaler  03/09/21   Melynda Ripple, MD     Vital Signs: BP (!) 128/36 (BP Location: Right Arm)   Pulse 72   Temp 97.7 F (36.5 C) (Axillary)   Resp 11   Ht '5\' 5"'$  (1.651 m)   Wt 127 lb 3.3 oz (57.7 kg)   SpO2 95%   BMI 21.17 kg/m   Physical Exam NAD, alert, disoriented vs. Fixated on current issues.  Abdomen: RLQ drain to gravity. Minimal turbid output in gravity bag. Dressing is clean/dry/intact. Drain insertion site c/d/I.   Imaging: IR Sinus/Fist Tube Chk-Non GI  Result Date: 08/12/2022 CLINICAL DATA:  72 year old female with history of right pelvic abscess status post percutaneous drain placement on 08/03/2022 presenting with feculent output from drainage catheter. EXAM: IR CATHETER TUBE CHANGE COMPARISON:  None Available. CONTRAST:  10 mL Omnipaque 300-administered via the existing percutaneous drain. FLUOROSCOPY TIME:  Two mGy TECHNIQUE: The patient was positioned supine on the fluoroscopy table. A preprocedural spot fluoroscopic image was obtained of the right lower quadrant and the existing percutaneous drainage catheter. Multiple spot fluoroscopic and radiographic images were obtained following the injection of a small amount of contrast via the existing percutaneous drainage catheter. The drainage catheter was then placed to bag drainage. FINDINGS: Contrast injection demonstrates minimal right lower quadrant fluid collection with fistulous connection to what appears to be  the appendix which eventually drains into the cecum. 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 No drain manipulation performed. PLAN: Drain was placed to bag drainage from bulb suction. Recommend discontinuation of drain flushing. Follow-up in 3-4 weeks in [facility] with CT abdomen pelvis and fluoroscopic drain injection to assess for abscess/fistula resolution. Ruthann Cancer, MD [facility]iology  Electronically Signed   By: Ruthann Cancer M.D.   On: 08/12/2022 10:19   CT ANGIO AO+BIFEM W & OR WO CONTRAST  Result Date: 08/10/2022 CLINICAL DATA:  72 year old with rest pain. EXAM: CT ANGIOGRAPHY OF ABDOMINAL AORTA WITH ILIOFEMORAL RUNOFF TECHNIQUE: Multidetector CT imaging of the abdomen, pelvis and lower extremities was performed using the standard protocol during bolus administration of intravenous contrast. Multiplanar CT image reconstructions and MIPs were obtained to evaluate the vascular anatomy. RADIATION DOSE REDUCTION: This exam was performed according to the departmental dose-optimization program which includes automated exposure control, adjustment of the mA and/or kV according to patient size and/or use of iterative reconstruction technique. CONTRAST:  152m OMNIPAQUE IOHEXOL 350 MG/ML SOLN COMPARISON:  CT abdomen and pelvis 08/06/2022 FINDINGS: VASCULAR Aorta: Diffuse circumferential calcifications involving the abdominal aorta without aneurysm or dissection. No significant aortic stenosis. Celiac: Celiac artery is patent with diffuse atherosclerotic calcifications. At least mild narrowing at the origin of the celiac trunk. No evidence for aneurysm or dissection involving the branch vessels. SMA: SMA is patent with at least moderate stenosis at the origin due to large amount of calcified plaque. Renals: Bilateral renal arteries are patent with diffuse atherosclerotic plaque. High-grade stenosis involving the right renal artery origin. At least mild to moderate stenosis in the proximal left renal artery. No aneurysm or dissection involving the renal arteries. IMA: Patent RIGHT Lower Extremity Inflow: Circumferential calcifications in the right common iliac artery without significant stenosis. High-grade stenosis or occlusion at the origin of the right internal iliac artery. Diffuse calcified plaque throughout the right external iliac artery. Stenosis in the right external artery is likely  hemodynamically significant with scattered areas of narrowing from calcified plaque. Outflow: Postsurgical changes in the right groin and the right common femoral artery is patent. Right profunda femoral arteries are calcified but patent. Native SFA is occluded. Right femoropopliteal bypass graft is tortuous but patent. Graft ties into the distal popliteal artery near the knee. There appears to be some intraluminal stenosis in the bypass graft. Limited evaluation of the distal graft due to motion artifact. Suspect that the distal graft is patent. Popliteal artery just distal to the graft is very limited due to motion artifact. Runoff: Limited evaluation of the runoff vessels due to diffuse calcifications. However, there appears to be flow in the posterior tibial artery and peroneal artery. Evidence for segmental disease throughout the anterior tibial artery and limited evaluation. LEFT Lower Extremity Inflow: Circumferential calcifications in left common iliac artery without significant stenosis. Probable high-grade stenosis at the origin left internal iliac artery. Narrowing in the mid and distal left external iliac artery from diffuse atherosclerotic disease. Outflow: Left common femoral artery is heavily calcified but patent. Origin of the left profunda femoral artery is heavily calcified. There is flow in the left profunda femoral arteries. Native left SFA is occluded. Left femoropopliteal bypass graft is occluded. Runoff: Limited evaluation due to calcified vessels. There appears to be flow in the left peroneal artery. Occlusive disease in the anterior tibial artery. There appears to be occlusive or segmental disease in the posterior  tibial artery. Veins: No obvious venous abnormality within the limitations of this arterial phase study. Review of the MIP images confirms the above findings. NON-VASCULAR Lower chest: Bandlike atelectasis or scarring in the right lower lobe. Otherwise, the visualized lung bases  are clear. Hepatobiliary: Evidence for cholelithiasis. Mild distention of the gallbladder is unchanged. Normal appearance of liver. Pancreas: Unremarkable. No pancreatic ductal dilatation or surrounding inflammatory changes. Spleen: Normal in size without focal abnormality. Adrenals/Urinary Tract: Adrenal glands are within normal limits. Atrophy in both kidneys compatible with history of end-stage renal disease. Evidence for bilateral renal cysts that to do not require dedicated follow-up. Urinary bladder is decompressed. Small amount of contrast within the urinary bladder. Stomach/Bowel: High-density material throughout the small and large bowel without dilatation or obstruction. Normal appearance of the stomach. Again noted is material just posterior to the rectum and near the tip of the coccyx on image 135/6. This material measures roughly 2.0 x 2.2 cm and minimally changed from the exam on 08/06/2022. Concern for increased densities in the posterior subcutaneous tissues along the left posterior aspect of the coccyx on image 124/6. Developing subcutaneous fistula tract cannot be excluded. Lymphatic: No significant lymph node enlargement in the abdomen or pelvis. Reproductive: Status post hysterectomy. No evidence for a left adnexa mass. Again noted is complex structure in the region of the right adnexa containing a small focus of gas and small calcifications. There is a percutaneous drain along the anterior aspect of this complex collection. Collection measures approximately 4.6 x 3.9 cm on image 115/6 and roughly measured 4.1 x 3.5 cm on the exam from 08/06/2022. Other: No free fluid.  Negative for free air. Musculoskeletal: Bilateral transmetatarsal amputations. No acute bone abnormality. IMPRESSION: VASCULAR 1. Severe atherosclerotic disease throughout the abdomen, pelvis and bilateral lower extremities. Aortic Atherosclerosis (ICD10-I70.0). 2. Bilateral inflow disease. Stenosis involving bilateral external  iliac arteries. 3. 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 4. Right femoropopliteal bypass graft is patent but limited evaluation due to motion artifact. 5. Very limited evaluation of the bilateral runoff vessels due to severe vascular calcifications. Likely single-vessel runoff on the left from the peroneal artery. At least 2 vessel runoff in the right lower extremity as described. 6. 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 7. Bilateral renal artery stenosis. NON-VASCULAR 1. Complex collection in the right hemipelvis has slightly enlarged in size since 08/06/2022. The anterior percutaneous drain is still in place and located along the anterior aspect of the collection. Slightly limited evaluation of this structure due to the phase of contrast. 2. Concern for fluid or material just posterior to the rectum and just inferior to the coccyx. There may also be material or fluid tracking in the posterior pelvic subcutaneous tissues near the coccyx. Recommend attention to this area on follow up imaging. 3. Cholelithiasis. Electronically Signed   By: Markus Daft M.D.   On: 08/10/2022 17:27   NM Hepatobiliary Liver Func  Result Date: 08/10/2022 CLINICAL DATA:  Concern for acute cholecystitis. EXAM: NUCLEAR MEDICINE HEPATOBILIARY IMAGING TECHNIQUE: Sequential images of the abdomen were obtained out to 60 minutes following intravenous administration of radiopharmaceutical. RADIOPHARMACEUTICALS:  5.2 mCi Tc-32m Choletec IV COMPARISON:  CT August 06, 2022. FINDINGS: Prompt uptake and biliary excretion of activity by the liver is seen. Gallbladder activity is visualized, consistent with patency of cystic duct. Biliary activity passes into small bowel, consistent  with patent common bile duct. IMPRESSION: Patent cystic and common bile ducts without scintigraphic evidence of  acute cholecystitis. Electronically Signed   By: Dahlia Bailiff M.D.   On: 08/10/2022 13:39   [place] EKG SITE RITE  Result Date: 08/10/2022 If Site Rite image not attached, placement could not be confirmed due to current cardiac rhythm.   Labs:  CBC: Recent Labs    08/11/22 0612 08/12/22 1011 08/12/22 1432 08/13/22 0329  WBC 28.1* 24.5* 25.5* 27.8*  HGB 12.9 11.0* 10.4* 11.5*  HCT 39.7 32.5* 30.0* 33.9*  PLT 411* 306 334 347    COAGS: Recent Labs    08/03/22 0422  INR 1.0    BMP: Recent Labs    08/09/22 0502 08/10/22 0446 08/12/22 1432 08/13/22 0329  NA 132* 128* 122* 132*  K 3.0* 3.2* 2.5* 3.3*  CL 91* 90* 84* 93*  CO2 22 21* 21* 24  GLUCOSE 159* 190* 251* 148*  BUN 34* 41* 37* 11  CALCIUM 10.0 10.0 9.6 9.3  CREATININE 5.01* 6.06* 6.50* 2.92*  GFRNONAA 9* 7* 6* 17*    LIVER FUNCTION TESTS: Recent Labs    08/20/2022 1730 08/03/22 0422 08/04/22 0438 08/05/22 0403 08/07/22 0627 08/09/22 0502 08/12/22 1432 08/13/22 0329  BILITOT 0.6 0.6 0.6 0.5  --   --   --   --   AST '19 15 15 15  '$ --   --   --   --   ALT '9 9 10 10  '$ --   --   --   --   ALKPHOS 93 86 86 73  --   --   --   --   PROT 6.3* 5.9* 6.2* 6.0*  --   --   --   --   ALBUMIN 2.7* 2.3* 2.2* 2.2* 2.3* 2.3* 2.0* 2.1*    Assessment and Plan: Perforated appendicitis with abscess s/p drain placement 08/03/22 in IR by Dr. Earleen Newport.   Drain remains in place with small amount of cloudy/turbid output since converting to a gravity bag with no flushes.  Last drain injection was performed 08/12/22 and confirmed a fistula.   CT Abdomen Pelvis reviewed by Dr. Earleen Newport today who notes no significant interval change.  Complex mass remains in place.  No undrained collection, no need to additional drainage. Discussed with surgery PA.   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 Suture and stat lock in place. Dressed appropriately.   Plan: Do not flush drain Record output Q shift. Dressing changes QD or PRN if soiled.   Discharge planning: Please contact IR APP or on call IR MD prior to patient d/c to ensure appropriate follow up plans are in place. We will plan to see the patient in approximately 3-4 weeks for drain evaluation, CT imaging and fluoroscopic drain injection.   IR will continue to follow - please call with questions or concerns.  Electronically Signed: Brynda Greathouse, MS RD PA-C    I spent a total  of 15 Minutes at the the patient's bedside AND on the patient's hospital floor or unit, greater than 50% of which was counseling/coordinating care for perforated appendicitis.

## 2022-08-19 NOTE — Progress Notes (Signed)
Occupational Therapy Treatment Patient Details Name: Destiny Day MRN: [id number] DOB: [birth year] Today's Date: 08/19/2022   History of present illness 72 year old female admitted 1/7  to the hospital with few days of right lower quadrant abdominal pain.  Imaging in the ED concerning for perforated appendicitis with a periappendiceal abscess with probably secondary involvement of the previously noted adnexal mass or a tubo-ovarian abscess.  She was placed on antibiotics, general surgery was consulted and she was admitted to the hospital. JP drain right lower quadrant placed.  PMH: ESRD on MWF HD, asthma/COPD, diastolic CHF, HTN, HLD, DM2   OT comments  Pt with increased confusion, but alert. Moderate assist for bed mobility. Sat EOB with supervision and participated in grooming and eating applesauce. Pt with c/o of buttocks pain, rolled with min assist and increased time to L side and pillow placed with pt reporting relief.    Recommendations for follow up therapy are one component of a multi-disciplinary discharge planning process, led by the attending physician.  Recommendations may be updated based on patient status, additional functional criteria and insurance authorization.    Follow Up Recommendations  Skilled nursing-short term rehab (<3 hours/day)     Assistance Recommended at Discharge Frequent or constant Supervision/Assistance  Patient can return home with the following  A lot of help with walking and/or transfers;A lot of help with bathing/dressing/bathroom;Assistance with cooking/housework;Direct supervision/assist for medications management;Direct supervision/assist for financial management;Assist for transportation;Help with stairs or ramp for entrance   Equipment Recommendations  BSC/3in1;Tub/shower seat    Recommendations for Other Services      Precautions / Restrictions Precautions Precautions: Fall Precaution Comments: drain right lower quadrant Required Braces  or Orthoses: Other Brace Other Brace: Pt has bil shoes with metal upright braces Restrictions Weight Bearing Restrictions: No       Mobility Bed Mobility Overal bed mobility: Needs Assistance Bed Mobility: Supine to Sit, Sit to Supine Rolling: Min assist   Supine to sit: Mod assist Sit to supine: Mod assist   General bed mobility comments: min to roll for placement of pillow to relieve buttocks pressure, assist for raising trunk and LEs back into bed    Transfers                   General transfer comment: did not attempt     Balance Overall balance assessment: Needs assistance   Sitting balance-Leahy Scale: Fair Sitting balance - Comments: B LE and one hand assist                                   ADL either performed or assessed with clinical judgement   ADL Overall ADL's : Needs assistance/impaired Eating/Feeding: Minimal assistance;Sitting Eating/Feeding Details (indicate cue type and reason): ate applesauce seated EOB Grooming: Wash/dry hands;Wash/dry face;Sitting;Supervision/safety               Lower Body Dressing: Total assistance;Bed level Lower Body Dressing Details (indicate cue type and reason): socks                    Extremity/Trunk Assessment              Vision       Perception     Praxis      Cognition Arousal/Alertness: Awake/alert Behavior During Therapy: Flat affect Overall Cognitive Status: Impaired/Different from baseline Area of Impairment: Orientation, Attention, Memory, Following commands, Safety/judgement, Awareness, Problem solving  Orientation Level: Disoriented to, Place, Time, Situation Current Attention Level: Focused Memory: Decreased short-term memory Following Commands: Follows one step commands with increased time, Follows one step commands inconsistently Safety/Judgement: Decreased awareness of safety, Decreased awareness of deficits Awareness:  Intellectual Problem Solving: Slow processing, Decreased initiation, Difficulty sequencing, Requires verbal cues, Requires tactile cues          Exercises      Shoulder Instructions       General Comments      Pertinent Vitals/ Pain       Pain Assessment Pain Assessment: Faces Faces Pain Scale: Hurts little more Pain Location: buttocks Pain Descriptors / Indicators: Grimacing, Guarding Pain Intervention(s): Repositioned  Home Living                                          Prior Functioning/Environment              Frequency  Min 2X/week        Progress Toward Goals  OT Goals(current goals can now be found in the care plan section)  Progress towards OT goals: Not progressing toward goals - comment  Acute Rehab OT Goals OT Goal Formulation: With patient Time For Goal Achievement: 08/26/22 Potential to Achieve Goals: [place] Discharge plan remains appropriate    Co-evaluation                 AM-PAC OT "6 Clicks" Daily Activity     Outcome Measure   Help from another person eating meals?: A Little Help from another person taking care of personal grooming?: A Little Help from another person toileting, which includes using toliet, bedpan, or urinal?: A Lot Help from another person bathing (including washing, rinsing, drying)?: A Lot Help from another person to put on and taking off regular upper body clothing?: A Lot Help from another person to put on and taking off regular lower body clothing?: A Lot 6 Click Score: 14    End of Session    OT Visit Diagnosis: Unsteadiness on feet (R26.81);Other abnormalities of gait and mobility (R26.89);Muscle weakness (generalized) (M62.81);Other symptoms and signs involving cognitive function   Activity Tolerance Patient tolerated treatment well   Patient Left in bed;with call bell/phone within reach;with bed alarm set   Nurse Communication          Time: 2122-4825 OT Time  Calculation (min): 24 min  Charges: OT General Charges $OT Visit: 1 Visit OT Treatments $Self Care/Home Management : 23-37 mins Cleta Alberts, OTR/L Acute Rehabilitation Services Office: [phone]   [name] 08/19/2022, 3:17 PM

## 2022-08-19 NOTE — Progress Notes (Signed)
Lab called with critical K+ value of 2.6, Provider called and gave order for 4K+ for remainder of tx. Changes made as ordered.

## 2022-08-19 NOTE — Progress Notes (Signed)
Nutrition Follow-up  DOCUMENTATION CODES:   Non-severe (moderate) malnutrition in context of chronic illness  INTERVENTION:   Encourage good PO intake Meal ordering with assist Continue Renal Multivitamin w/ minerals daily Discontinue Nepro Shake, replace with Ensure Enlive po BID, each supplement provides 350 kcal and 20 grams of protein. Magic cup BID with meals, each supplement provides 290 kcal and 9 grams of protein  NUTRITION DIAGNOSIS:   Moderate Malnutrition related to chronic illness as evidenced by severe muscle depletion, moderate fat depletion. - Ongoing  GOAL:   Patient will meet greater than or equal to 90% of their needs - Ongoing  MONITOR:   PO intake, Supplement acceptance, Labs, Weight trends, I & O's  REASON FOR ASSESSMENT:   Rounds    ASSESSMENT:   73 y.o. female presented to the ED with RLQ pain. PMH includes ESRD on HD (MWF), COPD, T2DM, GERD, HLD, and diverticulosis. Pt admitted with intra-abdominal abscess, concern for perforated appendicitis.   1/07 - Admitted 1/08 - drain placed  RD attempted to see pt x2, pt unavailable. Discussed case in rounds and with RN. Per EMR, pt has not been accepting the Nepro shakes. RD to change with alternative supplements. Pt weight is now below her EDW.  Medications reviewed and include: Risaquad, Hectorol, Ferric citrate, NovoLog SSI, Flagyl, Rena-vit, Protonix, IV antibiotics  Labs reviewed: Sodium 134, Potassium 2.6, Phosphorus 4.2, 24 hr CBGs 122-215  HD on 1/24 EDW: 57.4 kg Net UF: 800 mL   Diet Order:   Diet Order             Diet regular Room service appropriate? No; Fluid consistency: Thin; Fluid restriction: 1200 mL Fluid  Diet effective now                   EDUCATION NEEDS:   Not appropriate for education at this time  Skin:  Skin Assessment: Reviewed RN Assessment  Last BM:  1/24 - Type 6  Height:   Ht Readings from Last 1 Encounters:  08/04/22 '5\' 5"'$  (1.651 m)    Weight:    Wt Readings from Last 1 Encounters:  08/19/22 53.3 kg   Ideal Body Weight:  56.8 kg  BMI:  Body mass index is 19.55 kg/m.  Estimated Nutritional Needs:  Kcal:  1700-1900 Protein:  85-100 grams Fluid:  UOP + 1 L   [name], LDN Clinical Dietitian See [facility] for contact information.

## 2022-08-19 NOTE — Progress Notes (Signed)
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 She is s/p ir drain placement with improved abscess size but complicated course with leukocytosis       Micro: 1/08 fresh drain abd abscess cx - ecoli (R amp-sulb; S cefazolin, cipro, bactrim), bacteroides, kleb pna (S cefazolin, cipro, bactrim) 1/08 acute hep B panel positive hep B sAb, negative sAg   Procedures/imaging: 1/08 ir placed abscess percutaneous drain; 30 cc purulent fluid retrieved 1/11 repeat abd pelv ct slight improvement size of abscess 1/15 CTA with run-off showed increased right pelvic abscess and also new presacral fluid collection 1/15 hida scan negative 1/17 IR checked drain and in good position 1/24 repeat abd pelv ct showed stable 4 cm right pelvis abscess that is also next to a known right adnexal mass  W/u also revealed LLE >>> RLE ischemia and vascular surgery had planned on doing revascularization once abscess in check  ------- 1/24 assessment Persistent leukocytosis Diarrhea from abx Repeat ct 1/24 showed similar size to 1/15 but complex appearing fluid collection next to right adnexal mass. Presacral fluid collection not further mentioned  ?fistula from appendix, ?now involved the right adnexa mass  Surgery to evaluate again.      Plan: Can transition abx to cefazolin (so can do with dialysis) and continue metronidazole Duration a moving target based on serial imaging/clinical evaluation, unless surgery decide to washout/perform appendectomy Await surgery evaluation Discussed with primary team   I spent 50 minute reviewing data/chart, and coordinating care and >50% direct face to face time providing counseling/discussing diagnostics/treatment plan with patient   Principal Problem:   Abdominal infection ([place]) Active Problems:   Intra-abdominal abscess ([place])   Painful and cold lower extremity   Malnutrition of moderate degree   Allergies  Allergen Reactions   Adhesive  [Tape] Hives   Lactose Intolerance (Gi) Diarrhea   Lisinopril Cough   Prednisone Swelling and Other (See Comments)    Excessive fluid buildup    Scheduled Meds:  acidophilus  1 capsule Oral Daily   amLODipine  10 mg Oral QHS   atorvastatin  40 mg Oral QHS   carvedilol  12.5 mg Oral Q M,W,F-HD   carvedilol  12.5 mg Oral 2 times per day on Tue Thu Sat   Chlorhexidine Gluconate Cloth  6 each Topical Q0600   doxercalciferol  7 mcg Intravenous Q M,W,F-HD   feeding supplement (NEPRO CARB STEADY)  237 mL Oral BID BM   ferric citrate  420 mg Oral TID WC   fluticasone furoate-vilanterol  1 puff Inhalation  Daily   gabapentin  100 mg Oral QHS   Gerhardt's butt cream   Topical TID   insulin aspart  0-6 Units Subcutaneous TID WC   isosorbide-hydrALAZINE  1 tablet Oral 2 times per day on Sun Tue Thu Sat   isosorbide-hydrALAZINE  1 tablet Oral Q M,W,F-HD   metroNIDAZOLE  500 mg Oral Q12H   multivitamin  1 tablet Oral QHS   pantoprazole  40 mg Oral BID   potassium chloride  20 mEq Oral Once   sodium chloride flush  5 mL Intracatheter Q8H   ursodiol  300 mg Oral BID   Continuous Infusions:  cefTRIAXone (ROCEPHIN)  IV Stopped (08/18/22 2237)   heparin 750 Units/hr (08/19/22 0126)   PRN Meds:.albuterol, busPIRone, fluticasone, hydrALAZINE,  ondansetron (ZOFRAN) IV, ondansetron, oxyCODONE, prochlorperazine, traZODone   SUBJECTIVE: Reports diarrhea 3x on flow sheet last 24 hours No abd pain No n/v No rash  Wbc still 20's but ?trending down    Review of Systems: ROS All other ROS was negative, except mentioned above     OBJECTIVE: Vitals:   08/19/22 1000 08/19/22 1030 08/19/22 1100 08/19/22 1213  BP: (!) 125/48 (!) 141/45 (!) 137/44   Pulse: 75 60    Resp: '18 17 13   '$ Temp:      TempSrc:      SpO2:  93%    Weight:    53.3 kg  Height:       Body mass index is 19.55 kg/m.  Physical Exam General/constitutional: no distress, pleasant HEENT: Normocephalic, PER, Conj Clear, EOMI, Oropharynx clear Neck supple CV: rrr no mrg Lungs: clear to auscultation, normal respiratory effort Abd: Soft, Nontender -- drain jp in place Ext: no edema Skin: No Rash Neuro: nonfocal MSK: no peripheral joint swelling/tenderness/warmth    Lab Results Lab Results  Component Value Date   WBC 20.1 (H) 08/19/2022   HGB 10.1 (L) 08/19/2022   HCT 32.1 (L) 08/19/2022   MCV 99.1 08/19/2022   PLT 350 08/19/2022    Lab Results  Component Value Date   CREATININE 4.80 (H) 08/19/2022   BUN 18 08/19/2022   NA 134 (L) 08/19/2022   K 2.6 (LL) 08/19/2022   CL 94 (L) 08/19/2022   CO2 24 08/19/2022    Lab Results  Component Value Date   ALT 10 08/05/2022   AST 15 08/05/2022   ALKPHOS 73 08/05/2022   BILITOT 0.5 08/05/2022      Microbiology: No results found for this or any previous visit (from the past 240 hour(s)).    Serology:   Imaging: If present, new imagings (plain films, ct scans, and mri) have been personally visualized and interpreted; radiology reports have been reviewed. Decision making incorporated into the Impression / Recommendations.   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 No drain manipulation performed.   1/15 hida scan No  evidence cholecystitis  1/15 cta abd aorta with run-off IMPRESSION: VASCULAR   1. Severe atherosclerotic disease throughout the abdomen, pelvis and bilateral lower extremities. Aortic Atherosclerosis (ICD10-I70.0). 2. Bilateral inflow disease. Stenosis involving bilateral external iliac arteries. 3. 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 4. Right femoropopliteal bypass graft is patent but limited evaluation due to motion artifact. 5. Very limited evaluation of the bilateral runoff vessels due to severe vascular calcifications. Likely single-vessel runoff on the left from the peroneal artery.  At least 2 vessel runoff in the right lower extremity as described. 6. 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 7. Bilateral renal artery stenosis.   NON-VASCULAR   1. Complex collection in the right hemipelvis has slightly enlarged in size since 08/06/2022. The anterior percutaneous drain is still in place and located along the anterior aspect of the collection. Slightly limited evaluation of this structure due to the phase of contrast. 2. Concern for fluid or material just posterior to the rectum and just inferior to the coccyx. There may also be material or fluid tracking in the posterior pelvic subcutaneous tissues near the coccyx. Recommend attention to this area on follow up imaging. 3. Cholelithiasis.   1/11 ct abd pelv with contrast 1. Significant interval decrease in size of the right pelvic abscess status post percutaneous drainage, with pigtail drainage catheter appropriately positioned. There are a few residual tiny foci of internal gas, but no significant residual fluid component. The tip of the appendix terminates within and is inseparable from this area, again suspicious for perforated appendicitis. There are several loops  of small bowel adjacent to this area, but without obvious fistula or small-bowel inflammatory change. This area remains inseparable from the right ovary. 2. Unchanged 3.8 cm complex right ovarian mass with internal calcification, previously characterized on prior ultrasound. Gynecological consultation was recommended at that time. 3. Unchanged 2.1 cm heterogeneously enhancing left upper pole renal lesion, suspicious for primary renal neoplasm. Outpatient non-emergent follow-up renal protocol CT or MRI with and without contrast is recommended for further evaluation. 4. Unchanged cholelithiasis. 5.  Aortic Atherosclerosis     1/09 cxr No acute cardiopulm process     1/07 abd pelv ct with contrast 1. Interval enlargement of the previously noted loculated collection along the right pelvic sidewall with irregular mural enhancement, enhancing internal septa, and mild surrounding inflammatory stranding. Additionally, there has developed gas within the superior aspect of the mass, best seen on axial image # 68/3. When compared to remote prior examination of 10/15/2020, this is seen immediately adjacent to the appendix and complex right adnexal cystic lesion. Altogether, the findings are suspicious for a perforated appendicitis with a periappendiceal abscess with probable secondary involvement of the previously noted adnexal mass or a tubo-ovarian abscess. A malignant lesion is considered less likely given its relatively rapid evolution though a secondarily infected malignancy could appear in this fashion. Surgical consultation is recommended. 2. 2.1 cm hypoenhancing lesion within the upper pole of the left kidney suspicious for a primary renal neoplasm. Dedicated renal mass protocol CT or MRI examination is recommended for evaluation once the patient's acute issues have resolved. 3. Cholelithiasis. 4. Small right fat containing femoral hernia. Previously noted bowel within the hernia  sac has spontaneously reduced.     1/07 ruq [place] 1. Interval enlargement of the previously noted loculated collection along the right pelvic sidewall with irregular mural enhancement, enhancing internal septa, and mild surrounding inflammatory stranding. Additionally, there has developed gas within the superior aspect of the mass, best seen on axial image # 68/3. When compared to remote prior examination of 10/15/2020, this is seen immediately adjacent to the appendix and complex right adnexal cystic lesion. Altogether, the findings are suspicious for a perforated appendicitis with a periappendiceal abscess with probable secondary involvement of the previously noted adnexal mass or a tubo-ovarian abscess. A malignant lesion is considered less likely given its relatively rapid evolution though a secondarily infected malignancy could appear in this fashion. Surgical consultation is recommended.  2. 2.1 cm hypoenhancing lesion within the upper pole of the left kidney suspicious for a primary renal neoplasm. Dedicated renal mass protocol CT or MRI examination is recommended for evaluation once the patient's acute issues have resolved. 3. Cholelithiasis. 4. Small right fat containing femoral hernia. Previously noted bowel within the hernia sac has spontaneously reduced.  1/24 ct abd pelv 1. Drainage catheter remains in place in the region of a complex abnormality along the right pelvic sidewall. Residual component continues to measure approximately 4.9 x 5.3 x 4.7 cm. The majority of this probably relates to a chronic pelvic sidewall mass lesion. Previously there was speculation that there could have been superinfection due to the presence of gas on the study 08/19/2022. I think at this point it is very difficult to determine if there is any residual inflammatory component. Certainly there is no worsening or new finding. 2. Layering stones and sludge in the gallbladder. 3. Aortic  atherosclerosis.   Jabier Mutton, [facility] [place] [phone] pager    08/19/2022, 1:17 PM

## 2022-08-19 NOTE — Progress Notes (Signed)
[facility] Progress Note     Subjective: CC-  Seen in HD. Continues to have some abdominal pain, no better or worse than yesterday. Denies n/v. Unsure how much she is eating. Complaining of diarrhea, only 1 stool yesterday. WBC down 20.1, afebrile  Objective: Vital signs in last 24 hours: Temp:  [97.5 F (36.4 C)-97.8 F (36.6 C)] 97.5 F (36.4 C) (01/24 0830) Pulse Rate:  [60-85] 60 (01/24 1030) Resp:  [9-20] 13 (01/24 1100) BP: (106-165)/(37-119) 137/44 (01/24 1100) SpO2:  [90 %-100 %] 93 % (01/24 1030) Weight:  [54.1 kg-56 kg] 54.1 kg (01/24 0830) Last BM Date : 08/17/22  Intake/Output from previous day: No intake/output data recorded. Intake/Output this shift: No intake/output data recorded.  PE: Gen:  Alert, NAD Pulm:  rate and effort normal on room air Abd: soft, ND, mild RLQ tenderness, no output in gravity bag  Lab Results:  Recent Labs    08/18/22 0727 08/19/22 0853  WBC 21.7* 20.1*  HGB 11.3* 10.1*  HCT 35.6* 32.1*  PLT 338 350   BMET Recent Labs    08/17/22 0715 08/19/22 0853  NA 133* 134*  K 3.9 2.6*  CL 94* 94*  CO2 22 24  GLUCOSE 171* 173*  BUN 18 18  CREATININE 5.09* 4.80*  CALCIUM 10.1 10.2   PT/INR No results for input(s): "LABPROT", "INR" in the last 72 hours. CMP     Component Value Date/Time   NA 134 (L) 08/19/2022 0853   NA 135 (A) 07/15/2016 0000   K 2.6 (LL) 08/19/2022 0853   CL 94 (L) 08/19/2022 0853   CO2 24 08/19/2022 0853   GLUCOSE 173 (H) 08/19/2022 0853   BUN 18 08/19/2022 0853   BUN 30 (A) 07/15/2016 0000   CREATININE 4.80 (H) 08/19/2022 0853   CALCIUM 10.2 08/19/2022 0853   PROT 6.0 (L) 08/05/2022 0403   ALBUMIN 2.0 (L) 08/19/2022 0853   AST 15 08/05/2022 0403   ALT 10 08/05/2022 0403   ALKPHOS 73 08/05/2022 0403   BILITOT 0.5 08/05/2022 0403   GFRNONAA 9 (L) 08/19/2022 0853   GFRAA 6 (L) 06/26/2019 0500   Lipase     Component Value Date/Time   LIPASE 32 08/09/2022 1730        Studies/Results: CT ABDOMEN PELVIS W CONTRAST  Result Date: 08/19/2022 CLINICAL DATA:  Follow-up abscess drainage EXAM: CT ABDOMEN AND PELVIS WITH CONTRAST TECHNIQUE: Multidetector CT imaging of the abdomen and pelvis was performed using the standard protocol following bolus administration of intravenous contrast. RADIATION DOSE REDUCTION: This exam was performed according to the departmental dose-optimization program which includes automated exposure control, adjustment of the mA and/or kV according to patient size and/or use of iterative reconstruction technique. CONTRAST:  27m OMNIPAQUE IOHEXOL 350 MG/ML SOLN COMPARISON:  08/06/2022.  08/23/2022. 10/15/2020 FINDINGS: Lower chest: Lung bases are clear. Hepatobiliary: Layering stones and sludge remain evident in the gallbladder. No focal liver parenchymal lesion. Pancreas: Normal Spleen: Normal Adrenals/Urinary Tract: Adrenal glands remain normal. No change in appearance of the kidneys. No hydronephrosis. Bladder is collapsed but unremarkable. Stomach/Bowel: Stomach and small intestine are normal. Drainage catheter remains in place in the region of a complex abnormality along the right pelvic sidewall. Residual component continues to measure approximately 4.9 x 5.3 x 4.7 cm. The majority of this probably relates to a chronic pelvic sidewall mass lesion. Previously there was speculation that there could have been superinfection due to the presence of gas on the study 08/09/2022. I think at this  point it is very difficult to determine if 0 where seeing simply represents the residual mass lesion or if there is any residual inflammatory component. Certainly there is no worsening or new finding. Vascular/Lymphatic: Aortic atherosclerosis. No aneurysm. IVC is normal. Reproductive: Previous hysterectomy.  No other pelvic finding. Other: No free fluid or air. Musculoskeletal: Ordinary chronic spinal degenerative changes. IMPRESSION: 1. Drainage catheter  remains in place in the region of a complex abnormality along the right pelvic sidewall. Residual component continues to measure approximately 4.9 x 5.3 x 4.7 cm. The majority of this probably relates to a chronic pelvic sidewall mass lesion. Previously there was speculation that there could have been superinfection due to the presence of gas on the study 08/13/2022. I think at this point it is very difficult to determine if there is any residual inflammatory component. Certainly there is no worsening or new finding. 2. Layering stones and sludge in the gallbladder. 3. Aortic atherosclerosis. Aortic Atherosclerosis (ICD10-I70.0). Electronically Signed   By: Nelson Chimes M.D.   On: 08/19/2022 08:21    Anti-infectives: Anti-infectives (From admission, onward)    Start     Dose/Rate Route Frequency Ordered Stop   08/10/22 1400  cefTRIAXone (ROCEPHIN) 2 g in sodium chloride 0.9 % 100 mL IVPB        2 g 200 mL/hr over 30 Minutes Intravenous Every 24 hours 08/10/22 1025     08/10/22 1030  metroNIDAZOLE (FLAGYL) tablet 500 mg        500 mg Oral Every 12 hours 08/10/22 1025     08/08/22 1545  piperacillin-tazobactam (ZOSYN) IVPB 2.25 g  Status:  Discontinued        2.25 g 100 mL/hr over 30 Minutes Intravenous Every 8 hours 08/08/22 1455 08/10/22 1025   08/08/22 1415  ceFAZolin (ANCEF) IVPB 1 g/50 mL premix  Status:  Discontinued        1 g 100 mL/hr over 30 Minutes Intravenous Daily 08/08/22 1327 08/08/22 1454   08/22/2022 2245  piperacillin-tazobactam (ZOSYN) IVPB 2.25 g  Status:  Discontinued        2.25 g 100 mL/hr over 30 Minutes Intravenous Every 8 hours 08/25/2022 2215 08/08/22 1327   08/20/2022 1900  piperacillin-tazobactam (ZOSYN) IVPB 3.375 g        3.375 g 100 mL/hr over 30 Minutes Intravenous  Once 08/23/2022 1853 08/03/2022 2049        Assessment/Plan Suspected perforated appendicitis with pelvic abscess - S/p IR drain placement 1/8. 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 To  gravity drainage, no flushes. Hopefully fistula will close over time with drain in place. No urgent role for surgery. - WBC 20, afebrile. Drain with no output - Repeat CT scan today reports persistent complex abnormality (4.9 x 5.3 x 4.7 cm) along the right pelvic sidewall with drain in place, majority of this probably relates to a chronic pelvic sidewall mass lesion; no new or worsening finding. >> Will review with MD and IR   -Vascular following for critical limb ischemia and timing of angiography (and possible intervention) dependent on CT scan and further improvement   FEN: Reg diet with fluid restriction 1248m, Nepro shakes ID: [id number] rocephin/flagyl per ID  VTE: SCD's, heparin gtt Foley: none   ESRD CHF HTN Neuropathy HTN OSA   I reviewed last 24 h vitals and pain scores, last 48 h intake and output, and last 24 h labs and trends.    LOS: 17 days    BWellington Hampshire  PA-C [facility] 08/19/2022, 11:18 AM Please see Amion for pager number during day hours 7:00am-4:30pm

## 2022-08-20 ENCOUNTER — Other Ambulatory Visit: Payer: Self-pay | Admitting: Student

## 2022-08-20 DIAGNOSIS — K659 Peritonitis, unspecified: Secondary | ICD-10-CM | POA: Diagnosis not present

## 2022-08-20 DIAGNOSIS — E440 Moderate protein-calorie malnutrition: Secondary | ICD-10-CM | POA: Diagnosis not present

## 2022-08-20 DIAGNOSIS — K651 Peritoneal abscess: Secondary | ICD-10-CM | POA: Diagnosis not present

## 2022-08-20 DIAGNOSIS — M79606 Pain in leg, unspecified: Secondary | ICD-10-CM | POA: Diagnosis not present

## 2022-08-20 LAB — CBC
HCT: 34.4 % — ABNORMAL LOW (ref 36.0–46.0)
Hemoglobin: 11.3 g/dL — ABNORMAL LOW (ref 12.0–15.0)
MCH: 31.9 pg (ref 26.0–34.0)
MCHC: 32.8 g/dL (ref 30.0–36.0)
MCV: 97.2 fL (ref 80.0–100.0)
Platelets: 314 10*3/uL (ref 150–400)
RBC: 3.54 MIL/uL — ABNORMAL LOW (ref 3.87–5.11)
RDW: 14.7 % (ref 11.5–15.5)
WBC: 16.9 10*3/uL — ABNORMAL HIGH (ref 4.0–10.5)
nRBC: 0.3 % — ABNORMAL HIGH (ref 0.0–0.2)

## 2022-08-20 LAB — GLUCOSE, CAPILLARY
Glucose-Capillary: 165 mg/dL — ABNORMAL HIGH (ref 70–99)
Glucose-Capillary: 213 mg/dL — ABNORMAL HIGH (ref 70–99)
Glucose-Capillary: 168 mg/dL — ABNORMAL HIGH (ref 70–99)
Glucose-Capillary: 169 mg/dL — ABNORMAL HIGH (ref 70–99)

## 2022-08-20 LAB — HEPARIN LEVEL (UNFRACTIONATED): Heparin Unfractionated: 0.77 IU/mL — ABNORMAL HIGH (ref 0.30–0.70)

## 2022-08-20 LAB — RENAL FUNCTION PANEL
Albumin: 2.2 g/dL — ABNORMAL LOW (ref 3.5–5.0)
Anion gap: 16 — ABNORMAL HIGH (ref 5–15)
BUN: 10 mg/dL (ref 8–23)
CO2: 24 mmol/L (ref 22–32)
Calcium: 10.2 mg/dL (ref 8.9–10.3)
Chloride: 95 mmol/L — ABNORMAL LOW (ref 98–111)
Creatinine, Ser: 3.28 mg/dL — ABNORMAL HIGH (ref 0.44–1.00)
GFR, Estimated: 14 mL/min — ABNORMAL LOW (ref >60)
Glucose, Bld: 146 mg/dL — ABNORMAL HIGH (ref 70–99)
Phosphorus: 4 mg/dL (ref 2.5–4.6)
Potassium: 3.6 mmol/L (ref 3.5–5.1)
Sodium: 135 mmol/L (ref 135–145)

## 2022-08-20 NOTE — Progress Notes (Signed)
PHARMACY CONSULT NOTE FOR:  OUTPATIENT  PARENTERAL ANTIBIOTIC THERAPY (OPAT)  NOTE: This OPAT is informational only as patient will be receiving antibiotics with HD. Renal team aware.   Indication: IAI with E coli, Kleb PNA, Bacteroides  Regimen: Cefazolin 2g QHD MWF + Flagyl 500 mg PO BID  End date:  09/17/22 (Additional 4w from 1/25)  IV antibiotic discharge orders are pended. To discharging provider:  please sign these orders via discharge navigator,  Select New Orders & click on the button choice - Manage This Unsigned Work.     Thank you for allowing pharmacy to be a part of this patient's care.  Adria Dill, PharmD PGY-2 Infectious Diseases Resident  08/20/2022 10:02 AM

## 2022-08-20 NOTE — Progress Notes (Signed)
ANTICOAGULATION CONSULT NOTE  Pharmacy Consult for Heparin Indication:  Limb ischemia     Allergies  Allergen Reactions   Adhesive  [Tape] Hives   Lactose Intolerance (Gi) Diarrhea   Lisinopril Cough   Prednisone Swelling and Other (See Comments)    Excessive fluid buildup    Patient Measurements: Height: '5\' 5"'$  (165.1 cm) Weight: 55.7 kg (122 lb 12.7 oz) IBW/kg (Calculated) : 57  Vital Signs: Temp: 97.7 F (36.5 C) (01/25 0304) Temp Source: Oral (01/25 0304) BP: 153/51 (01/25 0304) Pulse Rate: 83 (01/25 0304)  Labs: Recent Labs    08/18/22 0727 08/19/22 0749 08/19/22 0853 08/20/22 0509  HGB 11.3*  --  10.1* 11.3*  HCT 35.6*  --  32.1* 34.4*  PLT 338  --  350 314  HEPARINUNFRC 0.59 0.53  --  0.77*  CREATININE  --   --  4.80* 3.28*     Estimated Creatinine Clearance: 13.8 mL/min (A) (by C-G formula based on SCr of 3.28 mg/dL (H)).   Assessment: 72 year old female continues heparin for limb ischemia.  Planning to pursue revascularization once pelvic abscess has improved/resolved.   Heparin level slightly elevated this AM  Goal of Therapy:  Heparin level 0.3-0.7 units/ml Monitor platelets by anticoagulation protocol: Yes   Plan:  Decrease heparin to 700 units / hr Continue daily levels  Monitor for signs and symptoms of bleeding.   Thank you Anette Guarneri, PharmD

## 2022-08-20 NOTE — Progress Notes (Signed)
Pt. Noted to be holding significant portion of medications with applesauce in her mouth. Encouraged patient to swallow, but Pt. Was unable to do so. Suctioned patient for safety.

## 2022-08-20 NOTE — Progress Notes (Signed)
[facility] Progress Note   Subjective:    Seen in room. Sleepy, no new complaints. Will need 4wks of antibiotics.    Objective Vitals:   08/19/22 2305 08/20/22 0304 08/20/22 0500 08/20/22 0810  BP: (!) 147/57 (!) 153/51  (!) 156/51  Pulse: 86 83  81  Resp: '15 15  12  '$ Temp: 97.8 F (36.6 C) 97.7 F (36.5 C)  98.4 F (36.9 C)  TempSrc: Oral Oral  Oral  SpO2: 91% 91%    Weight:   55.7 kg   Height:       Physical Exam General:chronically ill appearing, nad  Heart:RRR, no mrg Lungs:CTAB, nml WOB on RA Abdomen:soft, NT, RLQ drain in place Extremities:no LE edema, compression socks and braces in place Dialysis Access: LU AVG +b/t   Filed Weights   08/19/22 0830 08/19/22 1213 08/20/22 0500  Weight: 54.1 kg 53.3 kg 55.7 kg    Intake/Output Summary (Last 24 hours) at 08/20/2022 1154 Last data filed at 08/19/2022 1423 Gross per 24 hour  Intake 65 ml  Output --  Net 65 ml     Additional Objective Labs: Basic Metabolic Panel: Recent Labs  Lab 08/17/22 0715 08/19/22 0853 08/20/22 0509  NA 133* 134* 135  K 3.9 2.6* 3.6  CL 94* 94* 95*  CO2 '22 24 24  '$ GLUCOSE 171* 173* 146*  BUN '18 18 10  '$ CREATININE 5.09* 4.80* 3.28*  CALCIUM 10.1 10.2 10.2  PHOS 4.2 4.2 4.0    Liver Function Tests: Recent Labs  Lab 08/17/22 0715 08/19/22 0853 08/20/22 0509  ALBUMIN 2.0* 2.0* 2.2*    CBC: Recent Labs  Lab 08/16/22 0327 08/16/22 0650 08/17/22 0237 08/17/22 0715 08/18/22 0727 08/19/22 0853 08/20/22 0509  WBC QUANTITY NOT SUFFICIENT, UNABLE TO PERFORM TEST 23.4* 22.8* 24.9* 21.7* 20.1* 16.9*  NEUTROABS QUANTITY NOT SUFFICIENT, UNABLE TO PERFORM TEST RECOLLECT CBCD  CALLED TO TANYA DECAREAUS RN 0345 BTAYLOR 18.8*  --   --  17.8*  --   --   HGB QUANTITY NOT SUFFICIENT, UNABLE TO PERFORM TEST 10.5* 11.0* 10.6* 11.3* 10.1* 11.3*  HCT QUANTITY NOT SUFFICIENT, UNABLE TO PERFORM TEST 32.5* 32.5* 32.5* 35.6* 32.1* 34.4*  MCV QUANTITY NOT SUFFICIENT, UNABLE TO  PERFORM TEST 96.7 94.8 96.2 97.5 99.1 97.2  PLT QUANTITY NOT SUFFICIENT, UNABLE TO PERFORM TEST 318 288 329 338 350 314    Blood Culture    Component Value Date/Time   SDES WOUND 08/03/2022 1326   SPECREQUEST  ABCESS ABDOMEN 08/03/2022 1326   CULT  08/03/2022 1326    ABUNDANT ESCHERICHIA COLI MODERATE KLEBSIELLA PNEUMONIAE ABUNDANT BACTEROIDES THETAIOTAOMICRON BETA LACTAMASE POSITIVE Performed at [facility] [address] [place], [place] [address]    REPTSTATUS 08/08/2022 FINAL 08/03/2022 1326    CBG: Recent Labs  Lab 08/19/22 0800 08/19/22 1431 08/19/22 1726 08/19/22 2129 08/20/22 0807  GLUCAP 193* 122* 157* 199* 165*    Medications:   ceFAZolin (ANCEF) IV     [START ON 08/21/2022]  ceFAZolin (ANCEF) IV     heparin 700 Units/hr (08/20/22 0823)    acidophilus  1 capsule Oral Daily   amLODipine  10 mg Oral QHS   atorvastatin  40 mg Oral QHS   carvedilol  12.5 mg Oral Q M,W,F-HD   carvedilol  12.5 mg Oral 2 times per day on Tue Thu Sat   Chlorhexidine Gluconate Cloth  6 each Topical Q0600   doxercalciferol  7 mcg Intravenous Q M,W,F-HD   feeding supplement  237  mL Oral BID BM   ferric citrate  420 mg Oral TID WC   fluticasone furoate-vilanterol  1 puff Inhalation Daily   gabapentin  100 mg Oral QHS   Gerhardt's butt cream   Topical TID   insulin aspart  0-6 Units Subcutaneous TID WC   isosorbide-hydrALAZINE  1 tablet Oral 2 times per day on Sun Tue Thu Sat   isosorbide-hydrALAZINE  1 tablet Oral Q M,W,F-HD   metroNIDAZOLE  500 mg Oral Q12H   multivitamin  1 tablet Oral QHS   pantoprazole  40 mg Oral BID   sodium chloride flush  5 mL Intracatheter Q8H   ursodiol  300 mg Oral BID    Dialysis Orders: [facility] MWF  4h  400/600   57.4kg   2/3.5 bath LUA AVG  Heparin 2500+ 1065mdrun - last HD 1/5, post wt 57.3kg - hectorol 7 ug IV tiw - venofer 50 q week - mircera 30 ug q4 wks, last 12/8, due now   Assessment/Plan: Abd pain/ intra-abdominal infection  or abscess/perforated appendicitis - per CT imaging. Gen surg and IR consulted, started on IV abx. IR placed drain in RLQ fluid collection. Fluid sent for culture, growing E coli/ Klebsiella. Bacteroides. Per pmd. 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 Repeat CT 1/24 - no new findings.  Per ID continue cefazolin 2 g IV q HD + Flagyl 500 BID until 09/17/22 PAD/critical limb ischemia w/rest pain - CTA on 1/15 w/occluded L fem-pop. On Heparin.  VVS consulting, if improvement noted in abscess on repeat CT, may proceed with endovascular intervention d/t concern for compromise of LE and expected long recovery time for abdominal abscess.   ESRD - on HD MWF. HD on schedule. Next HD 1/26 Hypokalemia - intermittent with diarrhea.  diet liberalized but poor appetite. Encouraged nutrition. Supplementing prn. Improved.  HTN/ volume - Appears mostly euvolemic on exam.  Set UF goal 1-2L as tolerated. Bps have been high --Improved with resumed home amlodipine, carvedilol and bidil.  Hold pre HD d/t intra dialytic hypotension.  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 no ESA needed   MBD ckd - CCa elevated hold Hectorol, Phos in range.On Auryxia DM 2 - per pmd COPD/ asthma - no symptoms Encephalopahty: likely multifactorial.  Waxing and wanning. Stopped gabapentin. Ordered CT head to r/o stroke.  Nutrition - Alb low. Currently on regular diet.  Follow labs. If K^ will need to change to renal diet. Add protein supplements.   OLynnda ChildPA-C [facility] 08/20/2022,11:54 AM

## 2022-08-20 NOTE — Progress Notes (Signed)
PROGRESS NOTE        PATIENT DETAILS Name: Destiny Day Age: 72 y.o. Sex: female Date of Birth: [birth year] Admit Date: 08/25/2022 Admitting Physician Clance Boll, MD BDZ:[id number], Rachel Moulds, NP  Brief Summary: Patient is a 72 y.o.  female with history of ESRD on HD MWF, chronic HFpEF, PAD-s/p bilateral TMA who presented with RLQ pain-found to have perforated appendicitis with abscess.  Hospital course complicated by worsening left lower extremity rest pain due to critical limb ischemia.  See below for further details.  Significant events: 1/07>> admitted TRH-RLQ pain-subsequently found to have perforated appendicitis with abscess.  Significant studies: 1/07>> RUQ ultrasound: Cholelithiasis with positive sonographic Murphy sign. 1/07>> CT abdomen/pelvis: Findings suspicious for perforated appendicitis with periappendiceal abscess 1/11>> CT abdomen/pelvis: Interval decrease in size of the right pelvic abscess-s/p percutaneous drainage. 1/15>> HIDA scan: No evidence of acute cholecystitis. 1/15>> CT angio abdominal aorta: Severe atherosclerotic disease throughout abdomen/pelvis and bilateral lower extremities.  Severe left outflow disease with occlusion of left femoral-popliteal bypass graft, occlusion of native left SFA.  Complex collection in the right hemipelvis-enlarged since 1/11 study. 1/22>> CT head:No acute abnormality 1/24>> CT abdomen/pelvis: Drainage catheter in place-in the region of complex abnormality in the right pelvic sidewall, residual component 4.9 x 5.3 x 4.7 cm.  No clinical worsening.  Significant microbiology data: 1/08>> appendix abscess culture: E. coli/Klebsiella  Procedures: 1/08>> CT-guided drainage of right pelvic abscess 1/17>> drain check by IR  Consults: IR ID Vascular surgery General surgery Nephrology  Subjective: No major issues overnight-lying comfortably in bed.  Objective: Vitals: Blood pressure (!) 156/51,  pulse 81, temperature 98.4 F (36.9 C), temperature source Oral, resp. rate 12, height '5\' 5"'$  (1.651 m), weight 55.7 kg, SpO2 91 %.   Exam: Gen Exam:Alert awake-not in any distress HEENT:atraumatic, normocephalic Chest: B/L clear to auscultation anteriorly CVS:S1S2 regular Abdomen:soft non tender, non distended Extremities:no edema-bilateral TMA stump benign with Neurology: Non focal Skin: no rash  Pertinent Labs/Radiology:    Latest Ref Rng & Units 08/20/2022    5:09 AM 08/19/2022    8:53 AM 08/18/2022    7:27 AM  CBC  WBC 4.0 - 10.5 K/uL 16.9  20.1  21.7   Hemoglobin 12.0 - 15.0 g/dL 11.3  10.1  11.3   Hematocrit 36.0 - 46.0 % 34.4  32.1  35.6   Platelets 150 - 400 K/uL 314  350  338     Lab Results  Component Value Date   NA 135 08/20/2022   K 3.6 08/20/2022   CL 95 (L) 08/20/2022   CO2 24 08/20/2022      Assessment/Plan: Perforated appendicitis with periappendiceal abscess-s/p CT-guided drain placed on 1/8 by IR 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   Leukocytosis now slowly downtrending Repeat CT-with some residual collection but no new findings. ID switched Rocephin to cefazolin-remains on Flagyl. Awaiting further recommendations from ID/CCS team.   Critical left limb ischemia with rest pain PAD-s/p left femoral-popliteal bypass graft- CT angio with occluded left femoral to below-knee popliteal artery bypass Remains on IV heparin Appreciate vascular surgery following-recommendations are to pursue revascularization once pelvic abscess has resolved given risk of infection of prosthetic graft/stent. Await further recommendations from vascular surgery.  ESRD on HD MWF Nephrology following and directing care  Hypokalemia Repleted.  Acute metabolic encephalopathy Hospital delirium In the setting of acute illness/appendix abscess Overall better but with some  intermittent delirium. CT head done on 1/22 at family request-no acute abnormality seen. Maintain delirium precautions  HFrEF Euvolemic Volume removal with HD  HLD Continue statin  HTN BP better-amlodipine adjusted 1/18 Continue BiDil/Coreg.   DM-2 (A1c 6.2 on 1/8) CBGs stable with SSI  Recent Labs    08/19/22 1726 08/19/22 2129 08/20/22 0807  GLUCAP 157* 199* 165*    Neuropathic pain Rest  pain associated PAD Neurontin  Anxiety Continue with BuSpar as needed  Asthma COPD overlap No wheezing Continue bronchodilators  Complex right ovarian mass Outpatient GYN follow-up-patient/spouse aware of this recommendation-potential malignancy  Left upper pole renal lesion Concern for malignancy-needs outpatient follow-up with urology for further workup  Debility/deconditioning PT/OT eval Home health recommended  Nutrition Status: Nutrition Problem: Moderate Malnutrition Etiology: chronic illness Signs/Symptoms: severe muscle depletion, moderate fat depletion Interventions: Nepro shake, MVI, Liberalize Diet  BMI: Estimated body mass index is 20.43 kg/m as calculated from the following:   Height as of this encounter: '5\' 5"'$  (1.651 m).   Weight as of this encounter: 55.7 kg.   Code status:   Code Status: Full Code   DVT Prophylaxis: SCDs Start: 08/21/2022 2213 IV heparin   Family Communication: Spouse [name]-[phone] updated 1/24   Disposition Plan: Status is: Inpatient Remains inpatient appropriate because: Severity of illness.   Planned Discharge Destination:Home health   Diet: Diet Order             Diet regular Room service appropriate? No; Fluid consistency: Thin; Fluid restriction: 1200 mL Fluid  Diet effective now                     Antimicrobial agents: Anti-infectives (From admission, onward)    Start     Dose/Rate Route Frequency Ordered Stop   08/21/22 1800  ceFAZolin (ANCEF) IVPB 2g/100 mL premix        2 g 200 mL/hr  over 30 Minutes Intravenous Every M-W-F (1800) 08/19/22 1514     08/20/22 1400  ceFAZolin (ANCEF) IVPB 1 g/50 mL premix        1 g 100 mL/hr over 30 Minutes Intravenous  Once 08/19/22 1349     08/20/22 0000  ceFAZolin (ANCEF) IVPB 2g/100 mL premix  Status:  Discontinued        2 g 200 mL/hr over 30 Minutes Intravenous Every M-W-F (Hemodialysis) 08/19/22 1349 08/19/22 1514   08/19/22 1330  ceFAZolin (ANCEF) IVPB 2g/100 mL premix  Status:  Discontinued        2 g 200 mL/hr over 30 Minutes Intravenous Every M-W-F (Hemodialysis) 08/19/22 1344 08/19/22 1349   08/10/22 1400  cefTRIAXone (ROCEPHIN) 2 g in sodium chloride 0.9 % 100 mL IVPB  Status:  Discontinued        2 g 200 mL/hr over 30 Minutes Intravenous Every 24 hours 08/10/22 1025 08/19/22 1344   08/10/22 1030  metroNIDAZOLE (FLAGYL) tablet 500 mg        500 mg Oral Every 12 hours 08/10/22 1025     08/08/22 1545  piperacillin-tazobactam (ZOSYN) IVPB 2.25 g  Status:  Discontinued        2.25 g 100 mL/hr over 30 Minutes Intravenous Every 8 hours 08/08/22 1455 08/10/22 1025   08/08/22 1415  ceFAZolin (ANCEF) IVPB 1 g/50 mL premix  Status:  Discontinued        1 g 100 mL/hr over  30 Minutes Intravenous Daily 08/08/22 1327 08/08/22 1454   08/26/2022 2245  piperacillin-tazobactam (ZOSYN) IVPB 2.25 g  Status:  Discontinued        2.25 g 100 mL/hr over 30 Minutes Intravenous Every 8 hours 07/28/2022 2215 08/08/22 1327   08/19/2022 1900  piperacillin-tazobactam (ZOSYN) IVPB 3.375 g        3.375 g 100 mL/hr over 30 Minutes Intravenous  Once 07/29/2022 1853 08/19/2022 2049        MEDICATIONS: Scheduled Meds:  acidophilus  1 capsule Oral Daily   amLODipine  10 mg Oral QHS   atorvastatin  40 mg Oral QHS   carvedilol  12.5 mg Oral Q M,W,F-HD   carvedilol  12.5 mg Oral 2 times per day on Tue Thu Sat   Chlorhexidine Gluconate Cloth  6 each Topical Q0600   doxercalciferol  7 mcg Intravenous Q M,W,F-HD   feeding supplement  237 mL Oral BID BM   ferric  citrate  420 mg Oral TID WC   fluticasone furoate-vilanterol  1 puff Inhalation Daily   gabapentin  100 mg Oral QHS   Gerhardt's butt cream   Topical TID   insulin aspart  0-6 Units Subcutaneous TID WC   isosorbide-hydrALAZINE  1 tablet Oral 2 times per day on Sun Tue Thu Sat   isosorbide-hydrALAZINE  1 tablet Oral Q M,W,F-HD   metroNIDAZOLE  500 mg Oral Q12H   multivitamin  1 tablet Oral QHS   pantoprazole  40 mg Oral BID   sodium chloride flush  5 mL Intracatheter Q8H   ursodiol  300 mg Oral BID   Continuous Infusions:   ceFAZolin (ANCEF) IV     [START ON 08/21/2022]  ceFAZolin (ANCEF) IV     heparin 700 Units/hr (08/20/22 0823)   PRN Meds:.albuterol, busPIRone, fluticasone, hydrALAZINE, ondansetron (ZOFRAN) IV, ondansetron, oxyCODONE, prochlorperazine, traZODone   I have personally reviewed following labs and imaging studies  LABORATORY DATA: CBC: Recent Labs  Lab 08/16/22 0327 08/16/22 0650 08/17/22 0237 08/17/22 0715 08/18/22 0727 08/19/22 0853 08/20/22 0509  WBC QUANTITY NOT SUFFICIENT, UNABLE TO PERFORM TEST 23.4* 22.8* 24.9* 21.7* 20.1* 16.9*  NEUTROABS QUANTITY NOT SUFFICIENT, UNABLE TO PERFORM TEST RECOLLECT CBCD  CALLED TO TANYA DECAREAUS RN 0345 BTAYLOR 18.8*  --   --  17.8*  --   --   HGB QUANTITY NOT SUFFICIENT, UNABLE TO PERFORM TEST 10.5* 11.0* 10.6* 11.3* 10.1* 11.3*  HCT QUANTITY NOT SUFFICIENT, UNABLE TO PERFORM TEST 32.5* 32.5* 32.5* 35.6* 32.1* 34.4*  MCV QUANTITY NOT SUFFICIENT, UNABLE TO PERFORM TEST 96.7 94.8 96.2 97.5 99.1 97.2  PLT QUANTITY NOT SUFFICIENT, UNABLE TO PERFORM TEST 318 288 329 338 350 314     Basic Metabolic Panel: Recent Labs  Lab 08/16/22 0327 08/17/22 0237 08/17/22 0715 08/19/22 0853 08/20/22 0509  NA 131* 131* 133* 134* 135  K 3.0* 3.4* 3.9 2.6* 3.6  CL 95* 93* 94* 94* 95*  CO2 '22 23 22 24 24  '$ GLUCOSE 182* 172* 171* 173* 146*  BUN '11 16 18 18 10  '$ CREATININE 3.91* 4.99* 5.09* 4.80* 3.28*  CALCIUM 9.8 10.1 10.1 10.2  10.2  PHOS 3.6 4.0 4.2 4.2 4.0     GFR: Estimated Creatinine Clearance: 13.8 mL/min (A) (by C-G formula based on SCr of 3.28 mg/dL (H)).  Liver Function Tests: Recent Labs  Lab 08/16/22 0327 08/17/22 0237 08/17/22 0715 08/19/22 0853 08/20/22 0509  ALBUMIN 2.0* 2.0* 2.0* 2.0* 2.2*    No results for input(s): "LIPASE", "AMYLASE" in  the last 168 hours. No results for input(s): "AMMONIA" in the last 168 hours.  Coagulation Profile: No results for input(s): "INR", "PROTIME" in the last 168 hours.  Cardiac Enzymes: No results for input(s): "CKTOTAL", "CKMB", "CKMBINDEX", "TROPONINI" in the last 168 hours.  BNP (last 3 results) No results for input(s): "PROBNP" in the last 8760 hours.  Lipid Profile: No results for input(s): "CHOL", "HDL", "LDLCALC", "TRIG", "CHOLHDL", "LDLDIRECT" in the last 72 hours.  Thyroid Function Tests: No results for input(s): "TSH", "T4TOTAL", "FREET4", "T3FREE", "THYROIDAB" in the last 72 hours.  Anemia Panel: No results for input(s): "VITAMINB12", "FOLATE", "FERRITIN", "TIBC", "IRON", "RETICCTPCT" in the last 72 hours.  Urine analysis:    Component Value Date/Time   COLORURINE YELLOW 07/07/2016 0914   APPEARANCEUR CLOUDY (A) 07/07/2016 0914   LABSPEC >1.030 (H) 07/07/2016 0914   PHURINE 5.5 07/07/2016 0914   GLUCOSEU NEGATIVE 07/07/2016 0914   HGBUR SMALL (A) 07/07/2016 0914   BILIRUBINUR SMALL (A) 07/07/2016 0914   KETONESUR 15 (A) 07/07/2016 0914   PROTEINUR >300 (A) 07/07/2016 0914   UROBILINOGEN 0.2 07/30/2014 2000   NITRITE NEGATIVE 07/07/2016 0914   LEUKOCYTESUR SMALL (A) 07/07/2016 0914    Sepsis Labs: Lactic Acid, Venous    Component Value Date/Time   LATICACIDVEN 1.2 08/10/2022 0724    MICROBIOLOGY: No results found for this or any previous visit (from the past 240 hour(s)).   RADIOLOGY STUDIES/RESULTS: CT ABDOMEN PELVIS W CONTRAST  Result Date: 08/19/2022 CLINICAL DATA:  Follow-up abscess drainage EXAM: CT ABDOMEN  AND PELVIS WITH CONTRAST TECHNIQUE: Multidetector CT imaging of the abdomen and pelvis was performed using the standard protocol following bolus administration of intravenous contrast. RADIATION DOSE REDUCTION: This exam was performed according to the departmental dose-optimization program which includes automated exposure control, adjustment of the mA and/or kV according to patient size and/or use of iterative reconstruction technique. CONTRAST:  66m OMNIPAQUE IOHEXOL 350 MG/ML SOLN COMPARISON:  08/06/2022.  08/05/2022. 10/15/2020 FINDINGS: Lower chest: Lung bases are clear. Hepatobiliary: Layering stones and sludge remain evident in the gallbladder. No focal liver parenchymal lesion. Pancreas: Normal Spleen: Normal Adrenals/Urinary Tract: Adrenal glands remain normal. No change in appearance of the kidneys. No hydronephrosis. Bladder is collapsed but unremarkable. Stomach/Bowel: Stomach and small intestine are normal. Drainage catheter remains in place in the region of a complex abnormality along the right pelvic sidewall. Residual component continues to measure approximately 4.9 x 5.3 x 4.7 cm. The majority of this probably relates to a chronic pelvic sidewall mass lesion. Previously there was speculation that there could have been superinfection due to the presence of gas on the study 08/19/2022. I think at this point it is very difficult to determine if 0 where seeing simply represents the residual mass lesion or if there is any residual inflammatory component. Certainly there is no worsening or new finding. Vascular/Lymphatic: Aortic atherosclerosis. No aneurysm. IVC is normal. Reproductive: Previous hysterectomy.  No other pelvic finding. Other: No free fluid or air. Musculoskeletal: Ordinary chronic spinal degenerative changes. IMPRESSION: 1. Drainage catheter remains in place in the region of a complex abnormality along the right pelvic sidewall. Residual component continues to measure approximately 4.9 x  5.3 x 4.7 cm. The majority of this probably relates to a chronic pelvic sidewall mass lesion. Previously there was speculation that there could have been superinfection due to the presence of gas on the study 07/31/2022. I think at this point it is very difficult to determine if there is any residual inflammatory component. Certainly there  is no worsening or new finding. 2. Layering stones and sludge in the gallbladder. 3. Aortic atherosclerosis. Aortic Atherosclerosis (ICD10-I70.0). Electronically Signed   By: Nelson Chimes M.D.   On: 08/19/2022 08:21     LOS: 70 days   Oren Binet, MD  [facility]    To contact the attending provider between 7A-7P or the covering provider during after hours 7P-7A, please log into the web site [email or web address] and access using universal [place] password for that web site. If you do not have the password, please call the hospital operator.  08/20/2022, 12:09 PM

## 2022-08-20 NOTE — Progress Notes (Signed)
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 She is s/p ir drain placement with improved abscess size but complicated course with leukocytosis       Micro: 1/08 fresh drain abd abscess cx - ecoli (R amp-sulb; S cefazolin, cipro, bactrim), bacteroides, kleb pna (S cefazolin, cipro, bactrim) 1/08 acute hep B panel positive hep B sAb, negative sAg   Procedures/imaging: 1/08 ir placed abscess percutaneous drain; 30 cc purulent fluid retrieved 1/11 repeat abd pelv ct slight improvement size of abscess 1/15 CTA with run-off showed increased right pelvic abscess and also new presacral fluid collection 1/15 hida scan negative 1/17 IR checked drain and in good position 1/24 repeat abd pelv ct showed stable 4 cm right pelvis abscess that is also next to a known right adnexal mass  W/u also revealed LLE >>> RLE ischemia and vascular surgery had planned on doing revascularization once abscess in check  ------- 1/25 Persistent leukocytosis -- stable 20's Diarrhea from abx -- we have changed ceftriaxone to cefazolin 1/24 hopefully to help with diarrhea  Repeat ct 1/24 showed similar size to 1/15 but complex appearing fluid collection next to right adnexal mass.  Presacral fluid collection not further mentioned. Discussed with surgery team and they think there is appendiceal/abscess fistula, and plan to do surgery if it doesn't close in a few weeks  Vasc surgery also following for potential LLE revascularization but unclear timing in setting ongoing abd abscess   Plan: Continue cefazolin 2 gram tiw with dialysis (no need for picc) continue metronidazole 500 mg po bid Duration abx 4 more weeks starting 1/25-2/22/24 Weekly cbc, cmp, crp Hopefully with the drain remains/functioning she doesn't need abx beyond that F/u surgery F/u ID clinic 2/20 @ 1030 am; if she remains in hospital please reengage ID team within 1 week of 2/20 to reevaluate Id will sign off Discussed with primary team  I spent 50 minute reviewing data/chart, and coordinating care and >50% direct face to face time providing counseling/discussing diagnostics/treatment plan with patient   Principal Problem:   Abdominal infection ([place]) Active Problems:   Intra-abdominal abscess ([place])   Painful and cold lower extremity   Malnutrition of moderate degree   Allergies  Allergen Reactions   Adhesive  [Tape] Hives   Lactose Intolerance (Gi) Diarrhea   Lisinopril Cough   Prednisone Swelling and Other (See Comments)    Excessive fluid buildup    Scheduled Meds:  acidophilus  1 capsule Oral Daily  amLODipine  10 mg Oral QHS   atorvastatin  40 mg Oral QHS   carvedilol  12.5 mg Oral Q M,W,F-HD   carvedilol  12.5 mg Oral 2 times per day on Tue Thu Sat   Chlorhexidine Gluconate Cloth  6 each Topical Q0600   doxercalciferol  7 mcg Intravenous Q M,W,F-HD   feeding supplement  237 mL Oral BID BM   ferric citrate  420 mg Oral TID WC   fluticasone furoate-vilanterol  1 puff Inhalation Daily   gabapentin  100 mg Oral QHS   Gerhardt's butt cream   Topical TID   insulin aspart  0-6 Units Subcutaneous TID WC   isosorbide-hydrALAZINE  1 tablet Oral 2 times per day on Sun Tue Thu Sat    isosorbide-hydrALAZINE  1 tablet Oral Q M,W,F-HD   metroNIDAZOLE  500 mg Oral Q12H   multivitamin  1 tablet Oral QHS   pantoprazole  40 mg Oral BID   ursodiol  300 mg Oral BID   Continuous Infusions:   ceFAZolin (ANCEF) IV     [START ON 08/21/2022]  ceFAZolin (ANCEF) IV     heparin 700 Units/hr (08/20/22 0823)   PRN Meds:.albuterol, busPIRone, fluticasone, hydrALAZINE, ondansetron (ZOFRAN) IV, ondansetron, oxyCODONE, prochlorperazine, traZODone   SUBJECTIVE: Diarrhea 2-3 stools reported last 24 hours No f/c Appears sleepy everytime I had seen her this admission  Left leg pain remains    Review of Systems: ROS All other ROS was negative, except mentioned above     OBJECTIVE: Vitals:   08/20/22 0304 08/20/22 0500 08/20/22 0810 08/20/22 1212  BP: (!) 153/51  (!) 156/51 (!) 123/47  Pulse: 83  81 84  Resp: '15  12 15  '$ Temp: 97.7 F (36.5 C)  98.4 F (36.9 C) 98.4 F (36.9 C)  TempSrc: Oral  Oral Oral  SpO2: 91%     Weight:  55.7 kg    Height:       Body mass index is 20.43 kg/m.  Physical Exam General/constitutional: no distress, very sleepy, able to keeps eyes open for less than a minute; conversant when eyes open; cooperative HEENT: Normocephalic, PER, Conj Clear, EOMI, Oropharynx clear Neck supple CV: rrr no mrg Lungs: clear to auscultation, normal respiratory effort Abd: Soft, Nontender Ext: no edema Skin: No Rash Neuro: nonfocal MSK: left LE tenderness; bilateral tma stump no swelling/wound   Lab Results Lab Results  Component Value Date   WBC 16.9 (H) 08/20/2022   HGB 11.3 (L) 08/20/2022   HCT 34.4 (L) 08/20/2022   MCV 97.2 08/20/2022   PLT 314 08/20/2022    Lab Results  Component Value Date   CREATININE 3.28 (H) 08/20/2022   BUN 10 08/20/2022   NA 135 08/20/2022   K 3.6 08/20/2022   CL 95 (L) 08/20/2022   CO2 24 08/20/2022    Lab Results  Component Value Date   ALT 10 08/05/2022   AST 15 08/05/2022   ALKPHOS 73 08/05/2022   BILITOT  0.5 08/05/2022      Microbiology: No results found for this or any previous visit (from the past 240 hour(s)).    Serology:   Imaging: If present, new imagings (plain films, ct scans, and mri) have been personally visualized and interpreted; radiology reports have been reviewed. Decision making incorporated into the Impression / Recommendations.   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 No drain manipulation performed.   1/15 hida scan No evidence cholecystitis  1/15 cta  abd aorta with run-off IMPRESSION: VASCULAR   1. Severe atherosclerotic disease throughout the abdomen, pelvis and bilateral lower extremities. Aortic Atherosclerosis (ICD10-I70.0). 2. Bilateral inflow disease. Stenosis involving bilateral external iliac arteries. 3. 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 4. Right femoropopliteal bypass graft is patent but limited evaluation due to motion artifact. 5. Very limited evaluation of the bilateral runoff vessels due to severe vascular calcifications. Likely single-vessel runoff on the left from the peroneal artery. At least 2 vessel runoff in the right lower extremity as described. 6. 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 7. Bilateral renal artery stenosis.   NON-VASCULAR   1. Complex collection in the right hemipelvis has slightly enlarged in size since 08/06/2022. The anterior percutaneous drain is still in place and located along the anterior aspect of the collection. Slightly limited evaluation of this structure due to the phase of contrast. 2. Concern for fluid or material just posterior to the rectum and just inferior to the coccyx. There may also be material or fluid tracking in the posterior pelvic subcutaneous tissues near  the coccyx. Recommend attention to this area on follow up imaging. 3. Cholelithiasis.   1/11 ct abd pelv with contrast 1. Significant interval decrease in size of the right pelvic abscess status post percutaneous drainage, with pigtail drainage catheter appropriately positioned. There are a few residual tiny foci of internal gas, but no significant residual fluid component. The tip of the appendix terminates within and is inseparable from this area, again suspicious for perforated appendicitis. There are several loops of small bowel adjacent to this area, but without obvious fistula or small-bowel inflammatory change. This area remains inseparable from the right ovary. 2. Unchanged 3.8 cm complex right ovarian mass with internal calcification, previously characterized on prior ultrasound. Gynecological consultation was recommended at that time. 3. Unchanged 2.1 cm heterogeneously enhancing left upper pole renal lesion, suspicious for primary renal neoplasm. Outpatient non-emergent follow-up renal protocol CT or MRI with and without contrast is recommended for further evaluation. 4. Unchanged cholelithiasis. 5.  Aortic Atherosclerosis     1/09 cxr No acute cardiopulm process     1/07 abd pelv ct with contrast 1. Interval enlargement of the previously noted loculated collection along the right pelvic sidewall with irregular mural enhancement, enhancing internal septa, and mild surrounding inflammatory stranding. Additionally, there has developed gas within the superior aspect of the mass, best seen on axial image # 68/3. When compared to remote prior examination of 10/15/2020, this is seen immediately adjacent to the appendix and complex right adnexal cystic lesion. Altogether, the findings are suspicious for a perforated appendicitis with a periappendiceal abscess with probable secondary involvement of the previously noted adnexal mass or a tubo-ovarian abscess. A malignant  lesion is considered less likely given its relatively rapid evolution though a secondarily infected malignancy could appear in this fashion. Surgical consultation is recommended. 2. 2.1 cm hypoenhancing lesion within the upper pole of the left kidney suspicious for a primary renal neoplasm. Dedicated renal mass protocol CT or MRI examination is recommended for evaluation once the patient's acute issues have resolved. 3. Cholelithiasis. 4. Small right fat containing femoral hernia. Previously noted bowel within the hernia sac has spontaneously reduced.     1/07 ruq [place] 1. Interval enlargement of the previously noted loculated collection along the right pelvic sidewall with irregular mural enhancement, enhancing internal septa, and mild  surrounding inflammatory stranding. Additionally, there has developed gas within the superior aspect of the mass, best seen on axial image # 68/3. When compared to remote prior examination of 10/15/2020, this is seen immediately adjacent to the appendix and complex right adnexal cystic lesion. Altogether, the findings are suspicious for a perforated appendicitis with a periappendiceal abscess with probable secondary involvement of the previously noted adnexal mass or a tubo-ovarian abscess. A malignant lesion is considered less likely given its relatively rapid evolution though a secondarily infected malignancy could appear in this fashion. Surgical consultation is recommended. 2. 2.1 cm hypoenhancing lesion within the upper pole of the left kidney suspicious for a primary renal neoplasm. Dedicated renal mass protocol CT or MRI examination is recommended for evaluation once the patient's acute issues have resolved. 3. Cholelithiasis. 4. Small right fat containing femoral hernia. Previously noted bowel within the hernia sac has spontaneously reduced.  1/24 ct abd pelv 1. Drainage catheter remains in place in the region of a complex abnormality along the  right pelvic sidewall. Residual component continues to measure approximately 4.9 x 5.3 x 4.7 cm. The majority of this probably relates to a chronic pelvic sidewall mass lesion. Previously there was speculation that there could have been superinfection due to the presence of gas on the study 08/22/2022. I think at this point it is very difficult to determine if there is any residual inflammatory component. Certainly there is no worsening or new finding. 2. Layering stones and sludge in the gallbladder. 3. Aortic atherosclerosis.   Jabier Mutton, [facility] [place] [phone] pager    08/20/2022, 2:30 PM

## 2022-08-20 NOTE — Progress Notes (Signed)
[facility] Progress Note     Subjective: CC-  Husband at bedside. No acute changes. Patient still with some intermittent abdominal pain. No n/v but she has poor appetite and is not really eating much. WBC continues to trend down 16.9, afebrile Drain with no output  Objective: Vital signs in last 24 hours: Temp:  [97.6 F (36.4 C)-98.4 F (36.9 C)] 98.4 F (36.9 C) (01/25 0810) Pulse Rate:  [81-86] 81 (01/25 0810) Resp:  [12-17] 12 (01/25 0810) BP: (129-156)/(45-57) 156/51 (01/25 0810) SpO2:  [91 %-97 %] 91 % (01/25 0304) Weight:  [53.3 kg-55.7 kg] 55.7 kg (01/25 0500) Last BM Date : 08/19/22  Intake/Output from previous day: 01/24 0701 - 01/25 0700 In: 65 [P.O.:60; I.V.:5] Out: 0  Intake/Output this shift: No intake/output data recorded.  PE: Gen:  Alert, NAD Pulm:  rate and effort normal on room air Abd: soft, ND, mild RLQ tenderness, scant thin/brown fluid in gravity bag  Lab Results:  Recent Labs    08/19/22 0853 08/20/22 0509  WBC 20.1* 16.9*  HGB 10.1* 11.3*  HCT 32.1* 34.4*  PLT 350 314   BMET Recent Labs    08/19/22 0853 08/20/22 0509  NA 134* 135  K 2.6* 3.6  CL 94* 95*  CO2 24 24  GLUCOSE 173* 146*  BUN 18 10  CREATININE 4.80* 3.28*  CALCIUM 10.2 10.2   PT/INR No results for input(s): "LABPROT", "INR" in the last 72 hours. CMP     Component Value Date/Time   NA 135 08/20/2022 0509   NA 135 (A) 07/15/2016 0000   K 3.6 08/20/2022 0509   CL 95 (L) 08/20/2022 0509   CO2 24 08/20/2022 0509   GLUCOSE 146 (H) 08/20/2022 0509   BUN 10 08/20/2022 0509   BUN 30 (A) 07/15/2016 0000   CREATININE 3.28 (H) 08/20/2022 0509   CALCIUM 10.2 08/20/2022 0509   PROT 6.0 (L) 08/05/2022 0403   ALBUMIN 2.2 (L) 08/20/2022 0509   AST 15 08/05/2022 0403   ALT 10 08/05/2022 0403   ALKPHOS 73 08/05/2022 0403   BILITOT 0.5 08/05/2022 0403   GFRNONAA 14 (L) 08/20/2022 0509   GFRAA 6 (L) 06/26/2019 0500   Lipase     Component Value  Date/Time   LIPASE 32 08/01/2022 1730       Studies/Results: CT ABDOMEN PELVIS W CONTRAST  Result Date: 08/19/2022 CLINICAL DATA:  Follow-up abscess drainage EXAM: CT ABDOMEN AND PELVIS WITH CONTRAST TECHNIQUE: Multidetector CT imaging of the abdomen and pelvis was performed using the standard protocol following bolus administration of intravenous contrast. RADIATION DOSE REDUCTION: This exam was performed according to the departmental dose-optimization program which includes automated exposure control, adjustment of the mA and/or kV according to patient size and/or use of iterative reconstruction technique. CONTRAST:  59m OMNIPAQUE IOHEXOL 350 MG/ML SOLN COMPARISON:  08/06/2022.  08/25/2022. 10/15/2020 FINDINGS: Lower chest: Lung bases are clear. Hepatobiliary: Layering stones and sludge remain evident in the gallbladder. No focal liver parenchymal lesion. Pancreas: Normal Spleen: Normal Adrenals/Urinary Tract: Adrenal glands remain normal. No change in appearance of the kidneys. No hydronephrosis. Bladder is collapsed but unremarkable. Stomach/Bowel: Stomach and small intestine are normal. Drainage catheter remains in place in the region of a complex abnormality along the right pelvic sidewall. Residual component continues to measure approximately 4.9 x 5.3 x 4.7 cm. The majority of this probably relates to a chronic pelvic sidewall mass lesion. Previously there was speculation that there could have been superinfection due to the presence  of gas on the study 08/26/2022. I think at this point it is very difficult to determine if 0 where seeing simply represents the residual mass lesion or if there is any residual inflammatory component. Certainly there is no worsening or new finding. Vascular/Lymphatic: Aortic atherosclerosis. No aneurysm. IVC is normal. Reproductive: Previous hysterectomy.  No other pelvic finding. Other: No free fluid or air. Musculoskeletal: Ordinary chronic spinal degenerative  changes. IMPRESSION: 1. Drainage catheter remains in place in the region of a complex abnormality along the right pelvic sidewall. Residual component continues to measure approximately 4.9 x 5.3 x 4.7 cm. The majority of this probably relates to a chronic pelvic sidewall mass lesion. Previously there was speculation that there could have been superinfection due to the presence of gas on the study 08/22/2022. I think at this point it is very difficult to determine if there is any residual inflammatory component. Certainly there is no worsening or new finding. 2. Layering stones and sludge in the gallbladder. 3. Aortic atherosclerosis. Aortic Atherosclerosis (ICD10-I70.0). Electronically Signed   By: Nelson Chimes M.D.   On: 08/19/2022 08:21    Anti-infectives: Anti-infectives (From admission, onward)    Start     Dose/Rate Route Frequency Ordered Stop   08/21/22 1800  ceFAZolin (ANCEF) IVPB 2g/100 mL premix        2 g 200 mL/hr over 30 Minutes Intravenous Every M-W-F (1800) 08/19/22 1514     08/20/22 1400  ceFAZolin (ANCEF) IVPB 1 g/50 mL premix        1 g 100 mL/hr over 30 Minutes Intravenous  Once 08/19/22 1349     08/20/22 0000  ceFAZolin (ANCEF) IVPB 2g/100 mL premix  Status:  Discontinued        2 g 200 mL/hr over 30 Minutes Intravenous Every M-W-F (Hemodialysis) 08/19/22 1349 08/19/22 1514   08/19/22 1330  ceFAZolin (ANCEF) IVPB 2g/100 mL premix  Status:  Discontinued        2 g 200 mL/hr over 30 Minutes Intravenous Every M-W-F (Hemodialysis) 08/19/22 1344 08/19/22 1349   08/10/22 1400  cefTRIAXone (ROCEPHIN) 2 g in sodium chloride 0.9 % 100 mL IVPB  Status:  Discontinued        2 g 200 mL/hr over 30 Minutes Intravenous Every 24 hours 08/10/22 1025 08/19/22 1344   08/10/22 1030  metroNIDAZOLE (FLAGYL) tablet 500 mg        500 mg Oral Every 12 hours 08/10/22 1025     08/08/22 1545  piperacillin-tazobactam (ZOSYN) IVPB 2.25 g  Status:  Discontinued        2.25 g 100 mL/hr over 30 Minutes  Intravenous Every 8 hours 08/08/22 1455 08/10/22 1025   08/08/22 1415  ceFAZolin (ANCEF) IVPB 1 g/50 mL premix  Status:  Discontinued        1 g 100 mL/hr over 30 Minutes Intravenous Daily 08/08/22 1327 08/08/22 1454   08/04/2022 2245  piperacillin-tazobactam (ZOSYN) IVPB 2.25 g  Status:  Discontinued        2.25 g 100 mL/hr over 30 Minutes Intravenous Every 8 hours 08/04/2022 2215 08/08/22 1327   08/22/2022 1900  piperacillin-tazobactam (ZOSYN) IVPB 3.375 g        3.375 g 100 mL/hr over 30 Minutes Intravenous  Once 07/31/2022 1853 08/07/2022 2049        Assessment/Plan Suspected perforated appendicitis with pelvic abscess - S/p IR drain placement 1/8. 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 To gravity drainage, no flushes.  - WBC down 16.9, afebrile.  Drain with no output - Repeat CT scan 1/24 with some thickening of the pelvic sidewall but more likely this is a phlegmon, drain in place with confirmed fistula, no new issues - Continue current care. Encourage PO intake (RD following, changed Nepro shakes to Ensure yesterday). Husband at bedside and hopeful he can get her to eat more.  - Hopefully fistula will close over the next few weeks with drain in place. No urgent role for surgery. Will consider repeat imaging next week and repeat drain injection when appropriate. We will see again next week.   -Vascular following for critical limb ischemia and timing of angiography (and possible intervention) dependent on CT scan and further improvement   FEN: Reg diet with fluid restriction 1229m, Ensure ID: [email or web address] rocephin/flagyl per ID  VTE: SCD's, heparin gtt Foley: none   ESRD CHF HTN Neuropathy HTN OSA   I reviewed last 24 h vitals and pain scores, last 48 h intake and output, and last 24 h labs and trends.    LOS: 18 days    [facility] 08/20/2022, 11:02 AM Please see Amion for pager number during day hours 7:00am-4:30pm

## 2022-08-20 NOTE — Progress Notes (Signed)
Physical Therapy Treatment Patient Details Name: Destiny Day MRN: [id number] DOB: 17-Sep-1950 Today's Date: 08/20/2022   History of Present Illness 72 year old female admitted 1/7  to the hospital with few days of right lower quadrant abdominal pain.  Imaging in the ED concerning for perforated appendicitis with a periappendiceal abscess with probably secondary involvement of the previously noted adnexal mass or a tubo-ovarian abscess.  She was placed on antibiotics, general surgery was consulted and she was admitted to the hospital. JP drain right lower quadrant placed.  PMH: ESRD on MWF HD, asthma/COPD, diastolic CHF, HTN, HLD, DM2    PT Comments    Pt awake upon arrival of PT but limited by lethargy and cognition during today's session. Pt initially repeating phrases but requiring increased cueing to respond and participate. Pt able to perform supine<>sit transfers with mod-maxA today for strength, initiation, and balance. Once seated EOB, pt able to maintain balance with feet supported and BUE support with min guard for safety. Pt reporting dizziness, BP reading 128/40, recent reading as 123/47, pt reporting improvement after BLE exercises. AAROM for initiation and staying on task for BLE LAQ, B shoes and braces donned. Pt able to stand with +2 support and 2HHA, cueing provided for hip extension and upright posture, improving with second trial of standing. Pt tolerating standing position ~20 seconds before requesting to sit, modAx2 required. Acute PT will continue to follow up with pt as appropriate. Due to increased assist and recent lethargy, pt's discharge recommendations updated to SNF as pt is requiring increased assistance. With improvement to cognition and lethargy, pt with potential progression back to HHPT, will continue to assess in future sessions.     Recommendations for follow up therapy are one component of a multi-disciplinary discharge planning process, led by the attending  physician.  Recommendations may be updated based on patient status, additional functional criteria and insurance authorization.  Follow Up Recommendations  Skilled nursing-short term rehab (<3 hours/day) Can patient physically be transported by private vehicle: No   Assistance Recommended at Discharge Frequent or constant Supervision/Assistance  Patient can return home with the following A little help with walking and/or transfers;A little help with bathing/dressing/bathroom;Assistance with cooking/housework;Assist for transportation;Help with stairs or ramp for entrance   Equipment Recommendations  Rolling walker (2 wheels);Wheelchair (measurements PT);Wheelchair cushion (measurements PT)    Recommendations for Other Services       Precautions / Restrictions Precautions Precautions: Fall Precaution Comments: drain right lower quadrant Required Braces or Orthoses: Other Brace Other Brace: Pt has bil shoes with metal upright braces Restrictions Weight Bearing Restrictions: No     Mobility  Bed Mobility Overal bed mobility: Needs Assistance Bed Mobility: Supine to Sit, Sit to Supine     Supine to sit: HOB elevated, Max assist Sit to supine: Mod assist, HOB elevated, +2 for physical assistance   General bed mobility comments: pt requiring mod-maxA for bed mobility today for initiation, trunk support, and BLE management    Transfers Overall transfer level: Needs assistance Equipment used: 2 person hand held assist Transfers: Sit to/from Stand Sit to Stand: Mod assist, +2 physical assistance           General transfer comment: pt performing 2 sit<>stand trials with increased time for rest break. Pt requires assist for initiation, power-up, and balance, verbal and tactile cueing provided for upright posture and hip extension in standing    Ambulation/Gait  Stairs             Wheelchair Mobility    Modified Rankin (Stroke Patients  Only)       Balance Overall balance assessment: Needs assistance Sitting-balance support: Bilateral upper extremity supported, Feet supported Sitting balance-Leahy Scale: Fair Sitting balance - Comments: pt sitting EOB with min guard for safety and BUE support to offset pressure on bottom   Standing balance support: Bilateral upper extremity supported Standing balance-Leahy Scale: Poor Standing balance comment: Pt with posterior lean in standing with increased hip flexion and relying on the bed behind her for balance. Pt able to progress to more upright posture with second standing trial, but unable to achieve neutral hip position                            Cognition Arousal/Alertness: Lethargic Behavior During Therapy: Flat affect Overall Cognitive Status: Impaired/Different from baseline Area of Impairment: Memory, Following commands, Safety/judgement, Problem solving                     Memory: Decreased short-term memory Following Commands: Follows one step commands with increased time, Follows one step commands inconsistently Safety/Judgement: Decreased awareness of safety, Decreased awareness of deficits   Problem Solving: Slow processing, Decreased initiation, Difficulty sequencing, Requires verbal cues, Requires tactile cues General Comments: pt awake upon arrival of PT, however pt lethargic throughout session, requiring increased time and cueing to follow commands        Exercises General Exercises - Lower Extremity Long Arc Quad: AAROM, Both, 5 reps, Seated (increased cueing to participate, assist provided due to resistance of shoe on floor)    General Comments General comments (skin integrity, edema, etc.): VSS throughout, pt lethargic but responding intermittently, especially with tactile cues      Pertinent Vitals/Pain Pain Assessment Pain Assessment: Faces Faces Pain Scale: Hurts little more Pain Location: buttocks Pain Descriptors /  Indicators: Grimacing, Guarding Pain Intervention(s): Monitored during session, Repositioned    Home Living                          Prior Function            PT Goals (current goals can now be found in the care plan section) Acute Rehab PT Goals Patient Stated Goal: to go home PT Goal Formulation: With patient/family Time For Goal Achievement: 08/22/22 Potential to Achieve Goals: Good Progress towards PT goals: Progressing toward goals (when not limited by lethargy)    Frequency    Min 2X/week      PT Plan Discharge plan needs to be updated    Co-evaluation              AM-PAC PT "6 Clicks" Mobility   Outcome Measure  Help needed turning from your back to your side while in a flat bed without using bedrails?: A Little Help needed moving from lying on your back to sitting on the side of a flat bed without using bedrails?: A Lot Help needed moving to and from a bed to a chair (including a wheelchair)?: A Lot Help needed standing up from a chair using your arms (e.g., wheelchair or bedside chair)?: A Lot Help needed to walk in hospital room?: A Lot Help needed climbing 3-5 steps with a railing? : Total 6 Click Score: 12    End of Session   Activity Tolerance: Patient limited by lethargy Patient  left: with call bell/phone within reach;in bed;with bed alarm set Nurse Communication: Mobility status PT Visit Diagnosis: Muscle weakness (generalized) (M62.81)     Time: 2683-4196 PT Time Calculation (min) (ACUTE ONLY): 34 min  Charges:  $Therapeutic Activity: 23-37 mins                     Charlynne Cousins, PT DPT Acute Rehabilitation Services Office [phone]    Destiny Day 08/20/2022, 3:57 PM

## 2022-08-21 DIAGNOSIS — K651 Peritoneal abscess: Secondary | ICD-10-CM | POA: Diagnosis not present

## 2022-08-21 DIAGNOSIS — M79606 Pain in leg, unspecified: Secondary | ICD-10-CM | POA: Diagnosis not present

## 2022-08-21 DIAGNOSIS — K659 Peritonitis, unspecified: Secondary | ICD-10-CM | POA: Diagnosis not present

## 2022-08-21 DIAGNOSIS — E440 Moderate protein-calorie malnutrition: Secondary | ICD-10-CM | POA: Diagnosis not present

## 2022-08-21 LAB — GLUCOSE, CAPILLARY
Glucose-Capillary: 192 mg/dL — ABNORMAL HIGH (ref 70–99)
Glucose-Capillary: 186 mg/dL — ABNORMAL HIGH (ref 70–99)
Glucose-Capillary: 176 mg/dL — ABNORMAL HIGH (ref 70–99)

## 2022-08-21 LAB — HEPARIN LEVEL (UNFRACTIONATED): Heparin Unfractionated: 0.66 IU/mL (ref 0.30–0.70)

## 2022-08-21 MED ORDER — PENTAFLUOROPROP-TETRAFLUOROETH EX AERO: 1.0000 | INHALATION_SPRAY | CUTANEOUS | Status: DC | PRN

## 2022-08-21 MED ORDER — ANTICOAGULANT SODIUM CITRATE 4% (200MG/5ML) IV SOLN: 5.0000 mL | Status: DC | PRN

## 2022-08-21 MED ORDER — HEPARIN SODIUM (PORCINE) 1000 UNIT/ML IJ SOLN
INTRAMUSCULAR | Status: AC
  Filled 2022-08-21: qty 3

## 2022-08-21 MED ORDER — HEPARIN SODIUM (PORCINE) 1000 UNIT/ML DIALYSIS: 2500.0000 [IU] | INTRAMUSCULAR | Status: DC | PRN

## 2022-08-21 MED ORDER — HEPARIN SODIUM (PORCINE) 1000 UNIT/ML DIALYSIS: 1000.0000 [IU] | INTRAMUSCULAR | Status: DC | PRN

## 2022-08-21 MED ORDER — LIDOCAINE HCL (PF) 1 % IJ SOLN: 5.0000 mL | INTRAMUSCULAR | Status: DC | PRN

## 2022-08-21 MED ORDER — LIDOCAINE-PRILOCAINE 2.5-2.5 % EX CREA: 1.0000 | TOPICAL_CREAM | CUTANEOUS | Status: DC | PRN

## 2022-08-21 NOTE — Progress Notes (Signed)
PROGRESS NOTE        PATIENT DETAILS Name: Destiny Day Age: 72 y.o. Sex: female Date of Birth: 1951/04/20 Admit Date: 07/30/2022 Admitting Physician Destiny Goodell, MD ESP:[id number], Destiny Moulds, NP  Brief Summary: Patient is a 72 y.o.  female with history of ESRD on HD MWF, chronic HFpEF, PAD-s/p bilateral TMA who presented with RLQ pain-found to have perforated appendicitis with abscess.  Hospital course complicated by worsening left lower extremity rest pain due to critical limb ischemia.  See below for further details.  Significant events: 1/07>> admitted TRH-RLQ pain-subsequently found to have perforated appendicitis with abscess.  Significant studies: 1/07>> RUQ ultrasound: Cholelithiasis with positive sonographic Murphy sign. 1/07>> CT abdomen/pelvis: Findings suspicious for perforated appendicitis with periappendiceal abscess 1/11>> CT abdomen/pelvis: Interval decrease in size of the right pelvic abscess-s/p percutaneous drainage. 1/15>> HIDA scan: No evidence of acute cholecystitis. 1/15>> CT angio abdominal aorta: Severe atherosclerotic disease throughout abdomen/pelvis and bilateral lower extremities.  Severe left outflow disease with occlusion of left femoral-popliteal bypass graft, occlusion of native left SFA.  Complex collection in the right hemipelvis-enlarged since 1/11 study. 1/22>> CT head:No acute abnormality 1/24>> CT abdomen/pelvis: Drainage catheter in place-in the region of complex abnormality in the right pelvic sidewall, residual component 4.9 x 5.3 x 4.7 cm.  No clinical worsening.  Significant microbiology data: 1/08>> appendix abscess culture: E. coli/Klebsiella  Procedures: 1/08>> CT-guided drainage of right pelvic abscess 1/17>> drain check by IR  Consults: IR ID Vascular surgery General surgery Nephrology  Subjective: No major issues overnight-appears unchanged-frail-weak but awake/alert this morning.  No family at  bedside.  Objective: Vitals: Blood pressure 90/68, pulse 83, temperature 98.1 F (36.7 C), temperature source Oral, resp. rate (!) 26, height '5\' 5"'$  (1.651 m), weight 55.2 kg, SpO2 (!) 84 %.   Exam: Gen Exam:Alert awake-not in any distress HEENT:atraumatic, normocephalic Chest: B/L clear to auscultation anteriorly CVS:S1S2 regular Abdomen:soft non tender, non distended Extremities:no edema-bilateral TMA stump benign with Neurology: Non focal Skin: no rash  Pertinent Labs/Radiology:    Latest Ref Rng & Units 08/20/2022    5:09 AM 08/19/2022    8:53 AM 08/18/2022    7:27 AM  CBC  WBC 4.0 - 10.5 K/uL 16.9  20.1  21.7   Hemoglobin 12.0 - 15.0 g/dL 11.3  10.1  11.3   Hematocrit 36.0 - 46.0 % 34.4  32.1  35.6   Platelets 150 - 400 K/uL 314  350  338     Lab Results  Component Value Date   NA 135 08/20/2022   K 3.6 08/20/2022   CL 95 (L) 08/20/2022   CO2 24 08/20/2022      Assessment/Plan: Perforated appendicitis with periappendiceal abscess-s/p CT-guided drain placed on 1/8 by IR 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   Leukocytosis now slowly downtrending Repeat CT-with some residual collection but no new findings. ID switched antibiotics-current recommendations are for cefazolin (given with HD) and Flagyl-end date 2/22.  Needs weekly CBC/CMP/CRP.  ID follow-up arranged for 2/20. General surgery continues to recommend supportive care.   Critical left limb ischemia with rest pain PAD-s/p left femoral-popliteal bypass graft CT angio with occluded left femoral to below-knee popliteal artery bypass Remains on IV heparin Spoke with vascular surgery-Dr. Unk Day 1/26-he will post patient for  possible angiogram/revascularization early next week.  See prior notes for concerns from vascular surgery-given pelvic abscess-concern for prosthetic graft infection.  ESRD on HD MWF Nephrology  following and directing care  Hypokalemia Repleted.  Acute metabolic encephalopathy Hospital delirium In the setting of acute illness/appendix abscess Overall better but with some intermittent delirium. CT head done on 1/22 at family request-no acute abnormality seen. Maintain delirium precautions  HFrEF Euvolemic Volume removal with HD  HLD Continue statin  HTN BP better-amlodipine adjusted 1/18 Continue BiDil/Coreg.   DM-2 (A1c 6.2 on 1/8) CBGs stable with SSI  Recent Labs    08/20/22 1444 08/20/22 2156 08/21/22 0742  GLUCAP 168* 169* 192*    Neuropathic pain Rest  pain associated PAD Neurontin  Anxiety Continue with BuSpar as needed  Asthma COPD overlap No wheezing Continue bronchodilators  Complex right ovarian mass Outpatient GYN follow-up-patient/spouse aware of this recommendation-potential malignancy  Left upper pole renal lesion Concern for malignancy-needs outpatient follow-up with urology for further workup  Debility/deconditioning PT/OT eval Home health recommended  Nutrition Status: Nutrition Problem: Moderate Malnutrition Etiology: chronic illness Signs/Symptoms: severe muscle depletion, moderate fat depletion Interventions: Nepro shake, MVI, Liberalize Diet  BMI: Estimated body mass index is 20.25 kg/m as calculated from the following:   Height as of this encounter: '5\' 5"'$  (1.651 m).   Weight as of this encounter: 55.2 kg.   Code status:   Code Status: Full Code   DVT Prophylaxis: SCDs Start: 08/01/2022 2213 IV heparin   Family Communication: Spouse Destiny Day-[phone] updated 1/26   Disposition Plan: Status is: Inpatient Remains inpatient appropriate because: Severity of illness.   Planned Discharge Destination:Home health   Diet: Diet Order             Diet regular Room service appropriate? No; Fluid consistency: Thin; Fluid restriction: 1200 mL Fluid  Diet effective now                     Antimicrobial  agents: Anti-infectives (From admission, onward)    Start     Dose/Rate Route Frequency Ordered Stop   08/21/22 1800  ceFAZolin (ANCEF) IVPB 2g/100 mL premix        2 g 200 mL/hr over 30 Minutes Intravenous Every M-W-F (1800) 08/19/22 1514     08/20/22 1400  ceFAZolin (ANCEF) IVPB 1 g/50 mL premix        1 g 100 mL/hr over 30 Minutes Intravenous  Once 08/19/22 1349 08/20/22 1548   08/20/22 0000  ceFAZolin (ANCEF) IVPB 2g/100 mL premix  Status:  Discontinued        2 g 200 mL/hr over 30 Minutes Intravenous Every M-W-F (Hemodialysis) 08/19/22 1349 08/19/22 1514   08/19/22 1330  ceFAZolin (ANCEF) IVPB 2g/100 mL premix  Status:  Discontinued        2 g 200 mL/hr over 30 Minutes Intravenous Every M-W-F (Hemodialysis) 08/19/22 1344 08/19/22 1349   08/10/22 1400  cefTRIAXone (ROCEPHIN) 2 g in sodium chloride 0.9 % 100 mL IVPB  Status:  Discontinued        2 g 200 mL/hr over 30 Minutes Intravenous Every 24 hours 08/10/22 1025 08/19/22 1344   08/10/22 1030  metroNIDAZOLE (FLAGYL) tablet 500 mg        500 mg Oral Every 12 hours 08/10/22 1025     08/08/22 1545  piperacillin-tazobactam (ZOSYN) IVPB 2.25 g  Status:  Discontinued        2.25 g 100 mL/hr over 30 Minutes Intravenous Every 8 hours 08/08/22  1455 08/10/22 1025   08/08/22 1415  ceFAZolin (ANCEF) IVPB 1 g/50 mL premix  Status:  Discontinued        1 g 100 mL/hr over 30 Minutes Intravenous Daily 08/08/22 1327 08/08/22 1454   08/05/2022 2245  piperacillin-tazobactam (ZOSYN) IVPB 2.25 g  Status:  Discontinued        2.25 g 100 mL/hr over 30 Minutes Intravenous Every 8 hours 07/29/2022 2215 08/08/22 1327   08/08/2022 1900  piperacillin-tazobactam (ZOSYN) IVPB 3.375 g        3.375 g 100 mL/hr over 30 Minutes Intravenous  Once 08/10/2022 1853 08/03/2022 2049        MEDICATIONS: Scheduled Meds:  acidophilus  1 capsule Oral Daily   amLODipine  10 mg Oral QHS   atorvastatin  40 mg Oral QHS   carvedilol  12.5 mg Oral Q M,W,F-HD   carvedilol  12.5  mg Oral 2 times per day on Tue Thu Sat   Chlorhexidine Gluconate Cloth  6 each Topical Q0600   doxercalciferol  7 mcg Intravenous Q M,W,F-HD   feeding supplement  237 mL Oral BID BM   ferric citrate  420 mg Oral TID WC   fluticasone furoate-vilanterol  1 puff Inhalation Daily   gabapentin  100 mg Oral QHS   Gerhardt's butt cream   Topical TID   insulin aspart  0-6 Units Subcutaneous TID WC   isosorbide-hydrALAZINE  1 tablet Oral 2 times per day on Sun Tue Thu Sat   isosorbide-hydrALAZINE  1 tablet Oral Q M,W,F-HD   metroNIDAZOLE  500 mg Oral Q12H   multivitamin  1 tablet Oral QHS   pantoprazole  40 mg Oral BID   ursodiol  300 mg Oral BID   Continuous Infusions:   ceFAZolin (ANCEF) IV     heparin 700 Units/hr (08/20/22 1625)   PRN Meds:.albuterol, busPIRone, fluticasone, hydrALAZINE, ondansetron (ZOFRAN) IV, ondansetron, oxyCODONE, prochlorperazine, traZODone   I have personally reviewed following labs and imaging studies  LABORATORY DATA: CBC: Recent Labs  Lab 08/16/22 0327 08/16/22 0650 08/17/22 0237 08/17/22 0715 08/18/22 0727 08/19/22 0853 08/20/22 0509  WBC QUANTITY NOT SUFFICIENT, UNABLE TO PERFORM TEST 23.4* 22.8* 24.9* 21.7* 20.1* 16.9*  NEUTROABS QUANTITY NOT SUFFICIENT, UNABLE TO PERFORM TEST RECOLLECT CBCD  CALLED TO TANYA DECAREAUS RN [phone] BTAYLOR 18.8*  --   --  17.8*  --   --   HGB QUANTITY NOT SUFFICIENT, UNABLE TO PERFORM TEST 10.5* 11.0* 10.6* 11.3* 10.1* 11.3*  HCT QUANTITY NOT SUFFICIENT, UNABLE TO PERFORM TEST 32.5* 32.5* 32.5* 35.6* 32.1* 34.4*  MCV QUANTITY NOT SUFFICIENT, UNABLE TO PERFORM TEST 96.7 94.8 96.2 97.5 99.1 97.2  PLT QUANTITY NOT SUFFICIENT, UNABLE TO PERFORM TEST 318 288 329 338 350 314     Basic Metabolic Panel: Recent Labs  Lab 08/16/22 0327 08/17/22 0237 08/17/22 0715 08/19/22 0853 08/20/22 0509  NA 131* 131* 133* 134* 135  K 3.0* 3.4* 3.9 2.6* 3.6  CL 95* 93* 94* 94* 95*  CO2 '22 23 22 24 24  '$ GLUCOSE 182* 172* 171* 173*  146*  BUN '11 16 18 18 10  '$ CREATININE 3.91* 4.99* 5.09* 4.80* 3.28*  CALCIUM 9.8 10.1 10.1 10.2 10.2  PHOS 3.6 4.0 4.2 4.2 4.0     GFR: Estimated Creatinine Clearance: 13.7 mL/min (A) (by C-G formula based on SCr of 3.28 mg/dL (H)).  Liver Function Tests: Recent Labs  Lab 08/16/22 0327 08/17/22 0237 08/17/22 0715 08/19/22 0853 08/20/22 0509  ALBUMIN 2.0* 2.0* 2.0* 2.0* 2.2*  No results for input(s): "LIPASE", "AMYLASE" in the last 168 hours. No results for input(s): "AMMONIA" in the last 168 hours.  Coagulation Profile: No results for input(s): "INR", "PROTIME" in the last 168 hours.  Cardiac Enzymes: No results for input(s): "CKTOTAL", "CKMB", "CKMBINDEX", "TROPONINI" in the last 168 hours.  BNP (last 3 results) No results for input(s): "PROBNP" in the last 8760 hours.  Lipid Profile: No results for input(s): "CHOL", "HDL", "LDLCALC", "TRIG", "CHOLHDL", "LDLDIRECT" in the last 72 hours.  Thyroid Function Tests: No results for input(s): "TSH", "T4TOTAL", "FREET4", "T3FREE", "THYROIDAB" in the last 72 hours.  Anemia Panel: No results for input(s): "VITAMINB12", "FOLATE", "FERRITIN", "TIBC", "IRON", "RETICCTPCT" in the last 72 hours.  Urine analysis:    Component Value Date/Time   COLORURINE YELLOW 07/07/2016 0914   APPEARANCEUR CLOUDY (A) 07/07/2016 0914   LABSPEC >1.030 (H) 07/07/2016 0914   PHURINE 5.5 07/07/2016 0914   GLUCOSEU NEGATIVE 07/07/2016 0914   HGBUR SMALL (A) 07/07/2016 0914   BILIRUBINUR SMALL (A) 07/07/2016 0914   KETONESUR 15 (A) 07/07/2016 0914   PROTEINUR >300 (A) 07/07/2016 0914   UROBILINOGEN 0.2 07/30/2014 2000   NITRITE NEGATIVE 07/07/2016 0914   LEUKOCYTESUR SMALL (A) 07/07/2016 0914    Sepsis Labs: Lactic Acid, Venous    Component Value Date/Time   LATICACIDVEN 1.2 08/10/2022 0724    MICROBIOLOGY: No results found for this or any previous visit (from the past 240 hour(s)).   RADIOLOGY STUDIES/RESULTS: No results  found.   LOS: 19 days   Oren Binet, MD  [facility]    To contact the attending provider between 7A-7P or the covering provider during after hours 7P-7A, please log into the web site [email or web address] and access using universal [place] password for that web site. If you do not have the password, please call the hospital operator.  08/21/2022, 11:58 AM

## 2022-08-21 NOTE — Evaluation (Signed)
Clinical/Bedside Swallow Evaluation Patient Details  Name: Destiny Day MRN: [id number] Date of Birth: Mar 02, 1951  Today's Date: 08/21/2022 Time: SLP Start Time (ACUTE ONLY): 17 SLP Stop Time (ACUTE ONLY): 1519 SLP Time Calculation (min) (ACUTE ONLY): 13 min  Past Medical History:  Past Medical History:  Diagnosis Date   Abdominal bruit    Anxiety    Arthritis    Osteoarthritis   Asthma    Cervical disc disease    "pinced nerve"   CHF (congestive heart failure) (HCC)    COPD (chronic obstructive pulmonary disease) ([place])    Diabetes mellitus    Type II   Diverticulitis    ESRD (end stage renal disease) ([place])    dialysis - M/W/F- [place]   GERD (gastroesophageal reflux disease)    from medications   GI bleed 03/31/2013   Head injury 07/2017   History of hiatal hernia    Hyperlipidemia    Hypertension    Neuropathy    left leg   Nuclear sclerotic cataract of right eye 04/23/2020   Osteoporosis    Peripheral vascular disease ([place])    Pneumonia    "very young" and a few years ago   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)    Vitrectomy membrane peel endolaser 05-07-2021   Seasonal allergies    Shortness of breath dyspnea    WIth exertion, when fluid builds   Sleep apnea    can't afford cpap    Vitreomacular traction syndrome, 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   05-07-2021  Vitrectomy, membrane peel, PRP, resection posterior segment of NVD, remnants of peripheral NVE remain in place due to atrophic retina, no vitreous substitute   Past Surgical History:  Past Surgical History:  Procedure Laterality Date   A/V SHUNTOGRAM N/A 09/22/2016   Procedure: A/V Shuntogram - left arm;  Surgeon: Serafina Mitchell, MD;  Location: [place] [facility];  Service: Cardiovascular;  Laterality: N/A;   A/V SHUNTOGRAM N/A 03/22/2018   Procedure: A/V SHUNTOGRAM - left arm;  Surgeon: Serafina Mitchell, MD;  Location: [place] [facility];   Service: Cardiovascular;  Laterality: N/A;   A/V SHUNTOGRAM Left 11/17/2018   Procedure: A/V SHUNTOGRAM;  Surgeon: Angelia Mould, MD;  Location: [place] [facility];  Service: Cardiovascular;  Laterality: Left;   ABDOMINAL HYSTERECTOMY  1993`   AMPUTATION Left 09/01/2016   Procedure: LEFT FOOT TRANSMETATARSAL AMPUTATION;  Surgeon: Newt Minion, MD;  Location: [place];  Service: Orthopedics;  Laterality: Left;   AMPUTATION Right 08/11/2017   Procedure: RIGHT GREAT TOE AMPUTATION DIGIT;  Surgeon: Rosetta Posner, MD;  Location: [place];  Service: Vascular;  Laterality: Right;   AMPUTATION Right 12/31/2017   Procedure: RIGHT TRANSMETATARSAL AMPUTATION;  Surgeon: Newt Minion, MD;  Location: [place];  Service: Orthopedics;  Laterality: Right;   AV FISTULA PLACEMENT Left 04/21/2016   Procedure: INSERTION OF ARTERIOVENOUS (AV) GORE-TEX GRAFT ARM LEFT;  Surgeon: Elam Dutch, MD;  Location: [place];  Service: Vascular;  Laterality: Left;   BASCILIC VEIN TRANSPOSITION Left 07/10/2014   Procedure: BASCILIC VEIN TRANSPOSITION;  Surgeon: Angelia Mould, MD;  Location: [place];  Service: Vascular;  Laterality: Left;   [place] Right 11/08/2014   Procedure: FIRST STAGE BASILIC VEIN TRANSPOSITION;  Surgeon: Angelia Mould, MD;  Location: [place];  Service: Vascular;  Laterality: Right;   [place] Right 01/18/2015   Procedure: SECOND STAGE BASILIC VEIN TRANSPOSITION;  Surgeon:  Angelia Mould, MD;  Location: [place];  Service: Vascular;  Laterality: Right;   CRANIOTOMY N/A 08/23/2017   Procedure: CRANIOTOMY HEMATOMA EVACUATION SUBDURAL;  Surgeon: Ashok Pall, MD;  Location: [place];  Service: Neurosurgery;  Laterality: N/A;   CRANIOTOMY Left 08/24/2017   Procedure: CRANIOTOMY FOR RECURRENT ACUTE SUBDURAL HEMATOMA;  Surgeon: Ashok Pall, MD;  Location: [place];  Service: Neurosurgery;  Laterality: Left;   ESOPHAGOGASTRODUODENOSCOPY N/A 03/31/2013   Procedure:  ESOPHAGOGASTRODUODENOSCOPY (EGD);  Surgeon: Gatha Mayer, MD;  Location: [facility] ENDOSCOPY;  Service: Endoscopy;  Laterality: N/A;   EYE SURGERY     laser surgery   FEMORAL-POPLITEAL BYPASS GRAFT Left 07/08/2016   Procedure: LEFT  FEMORAL-BELOW KNEE POPLITEAL ARTERY BYPASS GRAFT USING 6MM X 80 CM PROPATEN GORETEX GRAFT WITH RINGS.;  Surgeon: Rosetta Posner, MD;  Location: [facility] OR;  Service: Vascular;  Laterality: Left;   FEMORAL-POPLITEAL BYPASS GRAFT Right 08/11/2017   Procedure: RIGHT FEMORAL TO BELOW KNEE POPLITEAL ARTERKY  BYPASS GRAFT USING 6MM RINGED PROPATEN GRAFT;  Surgeon: Rosetta Posner, MD;  Location: [place];  Service: Vascular;  Laterality: Right;   FISTULOGRAM Left 10/29/2014   Procedure: FISTULOGRAM;  Surgeon: Angelia Mould, MD;  Location: [facility] CATH LAB;  Service: Cardiovascular;  Laterality: Left;   GAS/FLUID EXCHANGE Left 12/20/2018   Procedure: AIR GAS EXCHANGE;  Surgeon: Jalene Mullet, MD;  Location: [place];  Service: Ophthalmology;  Laterality: Left;   INSERTION OF DIALYSIS CATHETER N/A 09/05/2020   Procedure: INSERTION OF DIALYSIS CATHETER;  Surgeon: Cherre Robins, MD;  Location: [place];  Service: Vascular;  Laterality: N/A;   INSERTION OF DIALYSIS CATHETER Left 03/13/2021   Procedure: INSERTION OF DIALYSIS CATHETER;  Surgeon: Waynetta Sandy, MD;  Location: [place];  Service: Vascular;  Laterality: Left;   IR SINUS/FIST TUBE CHK-NON GI  08/12/2022   KNEE ARTHROSCOPY Right 05/06/2018   Procedure: RIGHT KNEE ARTHROSCOPY;  Surgeon: Newt Minion, MD;  Location: [place];  Service: Orthopedics;  Laterality: Right;   KNEE ARTHROSCOPY Right 06/10/2018   Procedure: RIGHT KNEE ARTHROSCOPY AND DEBRIDEMENT;  Surgeon: Newt Minion, MD;  Location: [place];  Service: Orthopedics;  Laterality: Right;   LIGATION OF ARTERIOVENOUS  FISTULA Left 04/21/2016   Procedure: LIGATION OF ARTERIOVENOUS  FISTULA LEFT ARM;  Surgeon: Elam Dutch, MD;  Location: [place];  Service: Vascular;  Laterality:  Left;   LOWER EXTREMITY ANGIOGRAPHY N/A 04/06/2017   Procedure: Lower Extremity Angiography - Right;  Surgeon: Serafina Mitchell, MD;  Location: [place] [facility];  Service: Cardiovascular;  Laterality: N/A;   MEMBRANE PEEL Left 12/20/2018   Procedure: MEMBRANE PEEL;  Surgeon: Jalene Mullet, MD;  Location: [place];  Service: Ophthalmology;  Laterality: Left;   MEMBRANE PEEL Right 05/29/2022   Procedure: MEMBRANE PEEL;  Surgeon: [place] Horn, MD;  Location: [place];  Service: Ophthalmology;  Laterality: Right;   PARS PLANA VITRECTOMY Right 05/29/2022   Procedure: PARS PLANA VITRECTOMY WITH 25 GAUGE;  Surgeon: [place] Horn, MD;  Location: [place];  Service: Ophthalmology;  Laterality: Right;   PATCH ANGIOPLASTY Right 01/18/2015   Procedure: BASILIC VEIN PATCH ANGIOPLASTY USING VASCUGUARD PATCH;  Surgeon: Angelia Mould, MD;  Location: [place];  Service: Vascular;  Laterality: Right;   PERIPHERAL VASCULAR BALLOON ANGIOPLASTY  09/22/2016   Procedure: Peripheral Vascular Balloon Angioplasty;  Surgeon: Serafina Mitchell, MD;  Location: [place] [facility];  Service: Cardiovascular;;  Lt. Fistula   PERIPHERAL VASCULAR BALLOON ANGIOPLASTY Left  03/22/2018   Procedure: PERIPHERAL VASCULAR BALLOON ANGIOPLASTY;  Surgeon: Serafina Mitchell, MD;  Location: [place] [facility];  Service: Cardiovascular;  Laterality: Left;  Arm shunt   PERIPHERAL VASCULAR CATHETERIZATION N/A 06/23/2016   Procedure: Abdominal Aortogram w/Lower Extremity;  Surgeon: Serafina Mitchell, MD;  Location: [place] [facility];  Service: Cardiovascular;  Laterality: N/A;   PERIPHERAL VASCULAR CATHETERIZATION  06/23/2016   Procedure: Peripheral Vascular Intervention;  Surgeon: Serafina Mitchell, MD;  Location: [place] [facility];  Service: Cardiovascular;;  lt common and external illiac artery   PHOTOCOAGULATION WITH LASER Left 12/20/2018   Procedure: PHOTOCOAGULATION WITH LASER;  Surgeon: Jalene Mullet, MD;  Location: [place];  Service: Ophthalmology;   Laterality: Left;   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   Procedure: REPAIR OF COMPLEX TRACTION RETINAL DETACHMENT;  Surgeon: Jalene Mullet, MD;  Location: [place];  Service: Ophthalmology;  Laterality: Left;   REVISION OF ARTERIOVENOUS GORETEX GRAFT Left 09/05/2020   Procedure: REVISION OF ARTERIOVENOUS GORETEX GRAFT LEFT;  Surgeon: Cherre Robins, MD;  Location: [facility] OR;  Service: Vascular;  Laterality: Left;   HPI:  Patient is a 72 y.o.  female with history of ESRD on HD MWF, chronic HFpEF, PAD-s/p bilateral TMA who presented with RLQ pain-found to have perforated appendicitis with abscess.  Hospital course complicated by worsening left lower extremity rest pain due to critical limb ischemia. BSE 08/15/22 with minimal oral dysphagia due to lack of dentition with oral retention. Recommended she continue on regular texture but encouraged softer foods or using puree to clear. ST reconsulted today for swallow.    Assessment / Plan / Recommendation  Clinical Impression  Pt seen for swallow assessment with appearance of holding something in oral cavity on therapist's arrival. After prompts she was able to expectorate what appeared to be masticated food versus mucous- pt stated it was from breakfast. She also had a whiteish material that she expectorated. She appears to also have cognitive impairments and had difficulty following commands and expressing herself to therapist and complained of pain in lower extremities during repositioning. Her swallow was assessed 1/20 and found to have oral residue and today presents similar except more confused. She consumed applesauce texture without difficulty but with solid texture demonstrated mandibular movement without a rotary masitcation pattern. Thin liquid wash facilitated oral clearance. There were no indications of compromised airway across textures and it is recommended she continue thin liquids however SLP will downgrade texture to puree  and follow for appropriateness of advancement when ready. SLP Visit Diagnosis: Dysphagia, oral phase (R13.11)    Aspiration Risk  Mild aspiration risk    Diet Recommendation Dysphagia 1 (Puree);Thin liquid   Liquid Administration via: Straw;Cup Medication Administration: Crushed with puree Supervision: Patient able to self feed Compensations: Minimize environmental distractions;Slow rate;Small sips/bites;Lingual sweep for clearance of pocketing Postural Changes: Seated upright at 90 degrees    Other  Recommendations Oral Care Recommendations: Oral care BID    Recommendations for follow up therapy are one component of a multi-disciplinary discharge planning process, led by the attending physician.  Recommendations may be updated based on patient status, additional functional criteria and insurance authorization.  Follow up Recommendations Skilled nursing-short term rehab (<3 hours/day)      Assistance Recommended at Discharge    Functional Status Assessment Patient has had a recent decline in their functional status and demonstrates the ability to make significant improvements in function in a reasonable and predictable amount of time.  Frequency  and Duration min 2x/week  2 weeks       Prognosis Prognosis for Safe Diet Advancement: Good Barriers to Reach Goals: Cognitive deficits      Swallow Study   General Date of Onset: 08/15/22 HPI: Patient is a 72 y.o.  female with history of ESRD on HD MWF, chronic HFpEF, PAD-s/p bilateral TMA who presented with RLQ pain-found to have perforated appendicitis with abscess.  Hospital course complicated by worsening left lower extremity rest pain due to critical limb ischemia. BSE 08/15/22 with minimal oral dysphagia due to lack of dentition with oral retention. Recommended she continue on regular texture but encouraged softer foods or using puree to clear. ST reconsulted today for swallow. Type of Study: Bedside Swallow Evaluation Previous  Swallow Assessment:  (see HPI) Diet Prior to this Study: Regular;Thin liquids Temperature Spikes Noted: No Respiratory Status: Room air History of Recent Intubation: No Behavior/Cognition: Alert;Confused;Distractible;Requires cueing Oral Cavity Assessment: Other (comment) (expectorated what appeared to be pocketed food versus dried mucous) Oral Care Completed by SLP: Yes Oral Cavity - Dentition: Missing dentition (no upper dentition and approx 3 lower teeth) Vision: Functional for self-feeding Self-Feeding Abilities: Needs assist Patient Positioning: Upright in bed Baseline Vocal Quality: Normal Volitional Cough: Cognitively unable to elicit Volitional Swallow: Unable to elicit    Oral/Motor/Sensory Function Overall Oral Motor/Sensory Function: Within functional limits   Ice Chips Ice chips: Not tested   Thin Liquid Thin Liquid: Within functional limits Presentation: Straw    Nectar Thick Nectar Thick Liquid: Not tested   Honey Thick Honey Thick Liquid: Not tested   Puree Puree: Within functional limits   Solid     Solid: Impaired Oral Phase Impairments: Impaired mastication Oral Phase Functional Implications: Impaired mastication (reduced rotary movement) Pharyngeal Phase Impairments:  (none)      Ariyanna Oien, Orbie Pyo 08/21/2022,3:43 PM

## 2022-08-21 NOTE — Progress Notes (Addendum)
Received patient in her bed ,before the start of treatment.Alert x 3.Vitals were stable.  Hemodialysis access used : Left upper graft that works well.  Duration of treatment : 3.5. hours.  Medicine given.: Hectorol 88mg IV                             Oxycodone 5 mg p.o                             Heparin bolus not given,she has heparin drip going on at 7 cc/hr.  Fluid removed.900cc  Hemodialysis tx issue:At one time her blood pressure dropped .100 cc Ns given.  Hand off to the patient's nurse.Nurse made aware that patient moved her bowel and was being cleaned.Her stool looked blackish currant jelly and smelled like c-diff.

## 2022-08-21 NOTE — Progress Notes (Signed)
ANTICOAGULATION CONSULT NOTE  Pharmacy Consult for Heparin Indication:  Limb ischemia     Allergies  Allergen Reactions   Adhesive  [Tape] Hives   Lactose Intolerance (Gi) Diarrhea   Lisinopril Cough   Prednisone Swelling and Other (See Comments)    Excessive fluid buildup    Patient Measurements: Height: '5\' 5"'$  (165.1 cm) Weight: 56.4 kg (124 lb 5.4 oz) IBW/kg (Calculated) : 57  Vital Signs: Temp: 98 F (36.7 C) (01/26 0741) Temp Source: Oral (01/26 0741) BP: 157/46 (01/26 0741) Pulse Rate: 80 (01/26 0741)  Labs: Recent Labs    08/19/22 0749 08/19/22 0853 08/20/22 0509 08/21/22 0717  HGB  --  10.1* 11.3*  --   HCT  --  32.1* 34.4*  --   PLT  --  350 314  --   HEPARINUNFRC 0.53  --  0.77* 0.66  CREATININE  --  4.80* 3.28*  --      Estimated Creatinine Clearance: 14 mL/min (A) (by C-G formula based on SCr of 3.28 mg/dL (H)).   Assessment: 72 year old female continues heparin for limb ischemia.  Planning to pursue revascularization once pelvic abscess has improved/resolved.   Heparin level therapeutic this AM  Goal of Therapy:  Heparin level 0.3-0.7 units/ml Monitor platelets by anticoagulation protocol: Yes   Plan:  Continue heparin at 700 units / hr Continue daily levels  Monitor for signs and symptoms of bleeding.   Thank you Anette Guarneri, PharmD

## 2022-08-21 NOTE — Progress Notes (Signed)
[facility] Progress Note   Subjective: Seen on HD. She has her wig in bed with her which is something she has NEVER done. She does not recognize me-knows me well from OP clinic. Very sad. No C/Os.     Objective Vitals:   08/21/22 0840 08/21/22 0936 08/21/22 1000 08/21/22 1030  BP: (!) 145/43 (!) 109/37 (!) 118/39 (!) 123/47  Pulse: 77 77 78 80  Resp: 11 (!) 22 (!) 21 19  Temp: 98.1 F (36.7 C)     TempSrc: Oral     SpO2:  96% 100%   Weight: 55.2 kg     Height:       Physical Exam General: Chronically ill appearing female in NAD Heart: S1,,S2 + SEM 2/6  Lungs: CTAB decreased in bases. Abdomen: NT, NABS  Extremities: bilateral TMA stumps No LE edema Dialysis Access: L AVG cannulated   Additional Objective Labs: Basic Metabolic Panel: Recent Labs  Lab 08/17/22 0715 08/19/22 0853 08/20/22 0509  NA 133* 134* 135  K 3.9 2.6* 3.6  CL 94* 94* 95*  CO2 '22 24 24  '$ GLUCOSE 171* 173* 146*  BUN '18 18 10  '$ CREATININE 5.09* 4.80* 3.28*  CALCIUM 10.1 10.2 10.2  PHOS 4.2 4.2 4.0   Liver Function Tests: Recent Labs  Lab 08/17/22 0715 08/19/22 0853 08/20/22 0509  ALBUMIN 2.0* 2.0* 2.2*   No results for input(s): "LIPASE", "AMYLASE" in the last 168 hours. CBC: Recent Labs  Lab 08/16/22 0327 08/16/22 0650 08/17/22 0237 08/17/22 0715 08/18/22 0727 08/19/22 0853 08/20/22 0509  WBC QUANTITY NOT SUFFICIENT, UNABLE TO PERFORM TEST 23.4* 22.8* 24.9* 21.7* 20.1* 16.9*  NEUTROABS QUANTITY NOT SUFFICIENT, UNABLE TO PERFORM TEST RECOLLECT CBCD  CALLED TO TANYA DECAREAUS RN 0345 BTAYLOR 18.8*  --   --  17.8*  --   --   HGB QUANTITY NOT SUFFICIENT, UNABLE TO PERFORM TEST 10.5* 11.0* 10.6* 11.3* 10.1* 11.3*  HCT QUANTITY NOT SUFFICIENT, UNABLE TO PERFORM TEST 32.5* 32.5* 32.5* 35.6* 32.1* 34.4*  MCV QUANTITY NOT SUFFICIENT, UNABLE TO PERFORM TEST 96.7 94.8 96.2 97.5 99.1 97.2  PLT QUANTITY NOT SUFFICIENT, UNABLE TO PERFORM TEST 318 288 329 338 350 314   Blood  Culture    Component Value Date/Time   SDES WOUND 08/03/2022 1326   SPECREQUEST  ABCESS ABDOMEN 08/03/2022 1326   CULT  08/03/2022 1326    ABUNDANT ESCHERICHIA COLI MODERATE KLEBSIELLA PNEUMONIAE ABUNDANT BACTEROIDES THETAIOTAOMICRON BETA LACTAMASE POSITIVE Performed at [facility], [place] [address] [place], [place] [address]    REPTSTATUS 08/08/2022 FINAL 08/03/2022 1326    Cardiac Enzymes: No results for input(s): "CKTOTAL", "CKMB", "CKMBINDEX", "TROPONINI" in the last 168 hours. CBG: Recent Labs  Lab 08/20/22 0807 08/20/22 1216 08/20/22 1444 08/20/22 2156 08/21/22 0742  GLUCAP 165* 213* 168* 169* 192*   Iron Studies: No results for input(s): "IRON", "TIBC", "TRANSFERRIN", "FERRITIN" in the last 72 hours. '@lablastinr3'$ @ Studies/Results: No results found. Medications:   ceFAZolin (ANCEF) IV     heparin 700 Units/hr (08/20/22 1625)    acidophilus  1 capsule Oral Daily   amLODipine  10 mg Oral QHS   atorvastatin  40 mg Oral QHS   carvedilol  12.5 mg Oral Q M,W,F-HD   carvedilol  12.5 mg Oral 2 times per day on Tue Thu Sat   Chlorhexidine Gluconate Cloth  6 each Topical Q0600   doxercalciferol  7 mcg Intravenous Q M,W,F-HD   feeding supplement  237 mL Oral BID BM   ferric  citrate  420 mg Oral TID WC   fluticasone furoate-vilanterol  1 puff Inhalation Daily   gabapentin  100 mg Oral QHS   Gerhardt's butt cream   Topical TID   heparin sodium (porcine)       insulin aspart  0-6 Units Subcutaneous TID WC   isosorbide-hydrALAZINE  1 tablet Oral 2 times per day on Sun Tue Thu Sat   isosorbide-hydrALAZINE  1 tablet Oral Q M,W,F-HD   metroNIDAZOLE  500 mg Oral Q12H   multivitamin  1 tablet Oral QHS   pantoprazole  40 mg Oral BID   ursodiol  300 mg Oral BID     Dialysis Orders: [place] MWF  4h  400/600   57.4kg   2/3.5 bath LUA AVG  Heparin 2500 units IV initial bolus+ 1000 units IV mid run - last HD 1/5, post wt 57.3kg - hectorol 7 ug IV tiw - venofer 50 q  week - mircera 30 ug q4 wks, last 12/8, due now   Assessment/Plan: Abd pain/ intra-abdominal infection or abscess/perforated appendicitis - per CT imaging. Gen surg and IR consulted, started on IV abx. IR placed drain in RLQ fluid collection. Fluid sent for culture, growing E coli/ Klebsiella. Bacteroides. Per pmd. 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 Repeat CT 1/24 - no new findings.  Per ID continue cefazolin 2 g IV q HD + Flagyl 500 BID until 09/17/22 PAD/critical limb ischemia w/rest pain - CTA on 1/15 w/occluded L fem-pop. On Heparin.  VVS consulting, if improvement noted in abscess on repeat CT, may proceed with endovascular intervention d/t concern for compromise of LE and expected long recovery time for abdominal abscess.   ESRD - on HD MWF. HD on schedule. Next HD 08/08/2022 Hypokalemia - intermittent with diarrhea.  diet liberalized but poor appetite. Encouraged nutrition. Supplementing prn. Improved.  HTN/ volume - Appears mostly euvolemic on exam.  Set UF goal 1-2L as tolerated. Bps have been high --Improved with resumed home amlodipine, carvedilol and bidil.  Hold pre HD d/t intra dialytic hypotension.  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 no ESA needed   MBD ckd - CCa elevated hold Hectorol, Phos in range.On Auryxia DM 2 - per pmd COPD/ asthma - no symptoms Encephalopahty: likely multifactorial.  Waxing and wanning. Stopped gabapentin. Ordered CT head to r/o stroke.  Nutrition - Alb low. Currently on regular diet.  Follow labs. If K^ will need to change to renal diet. Add protein supplements.     Destanee Bedonie H. Clary Boulais NP-C 08/21/2022, 10:57 AM  [employer] [phone]

## 2022-08-21 NOTE — Progress Notes (Signed)
SLP Cancellation Note  Patient Details Name: Destiny Day MRN: [id number] DOB: [birth year]   Cancelled treatment:       Reason Eval/Treat Not Completed: Patient at procedure or test/unavailable. Pt is in HD. Will continue efforts.    Houston Siren 08/21/2022, 11:03 AM

## 2022-08-21 NOTE — Progress Notes (Signed)
Patient had episode of green emesis following medication administration. Medications were crushed in applesauce so unsure if patient was able to keep any of them down.

## 2022-08-22 DIAGNOSIS — K659 Peritonitis, unspecified: Secondary | ICD-10-CM | POA: Diagnosis not present

## 2022-08-22 LAB — CBC
HCT: 37.5 % (ref 36.0–46.0)
Hemoglobin: 12.1 g/dL (ref 12.0–15.0)
MCH: 32 pg (ref 26.0–34.0)
MCHC: 32.3 g/dL (ref 30.0–36.0)
MCV: 99.2 fL (ref 80.0–100.0)
Platelets: 273 10*3/uL (ref 150–400)
RBC: 3.78 MIL/uL — ABNORMAL LOW (ref 3.87–5.11)
RDW: 15.1 % (ref 11.5–15.5)
WBC: 23.1 10*3/uL — ABNORMAL HIGH (ref 4.0–10.5)
nRBC: 0.3 % — ABNORMAL HIGH (ref 0.0–0.2)

## 2022-08-22 LAB — GLUCOSE, CAPILLARY
Glucose-Capillary: 163 mg/dL — ABNORMAL HIGH (ref 70–99)
Glucose-Capillary: 249 mg/dL — ABNORMAL HIGH (ref 70–99)
Glucose-Capillary: 206 mg/dL — ABNORMAL HIGH (ref 70–99)
Glucose-Capillary: 134 mg/dL — ABNORMAL HIGH (ref 70–99)

## 2022-08-22 LAB — HEPARIN LEVEL (UNFRACTIONATED): Heparin Unfractionated: 0.54 IU/mL (ref 0.30–0.70)

## 2022-08-22 LAB — RENAL FUNCTION PANEL
Albumin: 2.2 g/dL — ABNORMAL LOW (ref 3.5–5.0)
Anion gap: 16 — ABNORMAL HIGH (ref 5–15)
BUN: 9 mg/dL (ref 8–23)
CO2: 25 mmol/L (ref 22–32)
Calcium: 10.6 mg/dL — ABNORMAL HIGH (ref 8.9–10.3)
Chloride: 94 mmol/L — ABNORMAL LOW (ref 98–111)
Creatinine, Ser: 2.95 mg/dL — ABNORMAL HIGH (ref 0.44–1.00)
GFR, Estimated: 16 mL/min — ABNORMAL LOW (ref >60)
Glucose, Bld: 178 mg/dL — ABNORMAL HIGH (ref 70–99)
Phosphorus: 3.6 mg/dL (ref 2.5–4.6)
Potassium: 3.1 mmol/L — ABNORMAL LOW (ref 3.5–5.1)
Sodium: 135 mmol/L (ref 135–145)

## 2022-08-22 MED ORDER — POTASSIUM CHLORIDE CRYS ER 10 MEQ PO TBCR: 10.0000 meq | EXTENDED_RELEASE_TABLET | Freq: Once | ORAL | Status: DC

## 2022-08-22 MED ORDER — ISOSORB DINITRATE-HYDRALAZINE 20-37.5 MG PO TABS
1.0000 | ORAL_TABLET | Freq: Two times a day (BID) | ORAL | Status: DC
  Administered 2022-08-23 – 2022-08-25 (×4): 1 via ORAL
  Filled 2022-08-22 (×4): qty 1

## 2022-08-22 MED ORDER — CARVEDILOL 3.125 MG PO TABS
3.1250 mg | ORAL_TABLET | Freq: Two times a day (BID) | ORAL | Status: DC
  Administered 2022-08-23 – 2022-08-25 (×4): 3.125 mg via ORAL
  Filled 2022-08-22 (×7): qty 1

## 2022-08-22 MED ORDER — POTASSIUM CHLORIDE 20 MEQ PO PACK
20.0000 meq | PACK | Freq: Once | ORAL | Status: AC
  Administered 2022-08-22: 10 meq via ORAL
  Filled 2022-08-22: qty 1

## 2022-08-22 MED ORDER — OXYCODONE HCL 5 MG PO TABS
10.0000 mg | ORAL_TABLET | ORAL | Status: DC | PRN
  Administered 2022-08-22 – 2022-08-24 (×3): 10 mg via ORAL
  Filled 2022-08-22 (×3): qty 2

## 2022-08-22 NOTE — Progress Notes (Signed)
PROGRESS NOTE        PATIENT DETAILS Name: Destiny Day Age: 72 y.o. Sex: female Date of Birth: August 09, 1950 Admit Date: 08/17/2022 Admitting Physician Lorrin Goodell, MD IWP:[id number], Rachel Moulds, NP  Brief Summary: Patient is a 72 y.o.  female with history of ESRD on HD MWF, chronic HFpEF, PAD-s/p bilateral TMA who presented with RLQ pain-found to have perforated appendicitis with abscess.  Hospital course complicated by worsening left lower extremity rest pain due to critical limb ischemia.  See below for further details.  Significant events: 1/07>> admitted TRH-RLQ pain-subsequently found to have perforated appendicitis with abscess.  Significant studies: 1/07>> RUQ ultrasound: Cholelithiasis with positive sonographic Murphy sign. 1/07>> CT abdomen/pelvis: Findings suspicious for perforated appendicitis with periappendiceal abscess 1/11>> CT abdomen/pelvis: Interval decrease in size of the right pelvic abscess-s/p percutaneous drainage. 1/15>> HIDA scan: No evidence of acute cholecystitis. 1/15>> CT angio abdominal aorta: Severe atherosclerotic disease throughout abdomen/pelvis and bilateral lower extremities.  Severe left outflow disease with occlusion of left femoral-popliteal bypass graft, occlusion of native left SFA.  Complex collection in the right hemipelvis-enlarged since 1/11 study. 1/22>> CT head:No acute abnormality 1/24>> CT abdomen/pelvis: Drainage catheter in place-in the region of complex abnormality in the right pelvic sidewall, residual component 4.9 x 5.3 x 4.7 cm.  No clinical worsening.  Significant microbiology data: 1/08>> appendix abscess culture: E. coli/Klebsiella  Procedures: 1/08>> CT-guided drainage of right pelvic abscess 1/17>> drain check by IR  Consults: IR ID Vascular surgery General surgery Nephrology  Subjective: Patient in bed, appears comfortable, denies any headache, no fever, no chest pain or pressure, no  shortness of breath , no abdominal pain. No focal weakness.  Some intermittent left leg pain.  Objective: Vitals: Blood pressure (!) 64/51, pulse 82, temperature 98.1 F (36.7 C), temperature source Oral, resp. rate 17, height '5\' 5"'$  (1.651 m), weight 54.3 kg, SpO2 92 %.   Exam:  Awake Alert, No new F.N deficits, Normal affect Fletcher.AT,PERRAL Supple Neck, No JVD,   Symmetrical Chest wall movement, Good air movement bilaterally, CTAB RRR,No Gallops, Rubs or new Murmurs,  +ve B.Sounds, right lower quadrant abdominal drain, bilateral TMA stumps, left leg mildly cold as compared to the right.      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   Leukocytosis now slowly downtrending Repeat CT-with some residual collection but no new findings. ID switched antibiotics-current recommendations are for cefazolin (given with HD) and Flagyl-end date 09/17/22.  Needs weekly CBC/CMP/CRP.  ID follow-up arranged for 2/20. General surgery continues to recommend supportive care.   Critical left limb ischemia with rest pain PAD-s/p left femoral-popliteal bypass graft CT angio with occluded left femoral to below-knee popliteal artery bypass Remains on IV heparin Spoke with vascular surgery-Dr. Unk Lightning 1/26-he will post patient for possible angiogram/revascularization early next week.  See prior notes for concerns from vascular surgery-given pelvic abscess-concern for prosthetic graft infection.  Patient agreeable for blood transfusion if needed during the surgery.  ESRD on HD MWF Nephrology following and directing care  Hypokalemia Repleted.  Acute metabolic encephalopathy Hospital delirium In the setting of acute illness/appendix abscess Overall better but with some intermittent delirium. CT head done  on 1/22 at family request-no acute  abnormality seen. Maintain delirium precautions  HFrEF Euvolemic Volume removal with HD  HLD Continue statin  HTN Pressure running low will adjust blood pressure medications on 08/22/2022.  Neuropathic pain Rest  pain associated PAD Neurontin  Anxiety Continue with BuSpar as needed  Asthma COPD overlap No wheezing Continue bronchodilators  Complex right ovarian mass Outpatient GYN follow-up-patient/spouse aware of this recommendation-potential malignancy  Left upper pole renal lesion Concern for malignancy-needs outpatient follow-up with urology for further workup  Debility/deconditioning PT/OT eval Home health recommended  DM-2 (A1c 6.2 on 1/8) CBGs stable with SSI  Recent Labs    08/21/22 1609 08/21/22 2134 08/22/22 0924  GLUCAP 186* 176* 163*    Nutrition Status: Nutrition Problem: Moderate Malnutrition Etiology: chronic illness Signs/Symptoms: severe muscle depletion, moderate fat depletion Interventions: Nepro shake, MVI, Liberalize Diet  BMI: Estimated body mass index is 19.92 kg/m as calculated from the following:   Height as of this encounter: '5\' 5"'$  (1.651 m).   Weight as of this encounter: 54.3 kg.   Code status:   Code Status: Full Code   DVT Prophylaxis: SCDs Start: 08/10/2022 2213 IV heparin   Family Communication: Spouse [name] updated 1/26   Disposition Plan: Status is: Inpatient Remains inpatient appropriate because: Severity of illness.   Planned Discharge Destination:Home health   Diet: Diet Order             DIET - DYS 1 Room service appropriate? No; Fluid consistency: Thin  Diet effective now                    MEDICATIONS: Scheduled Meds:  acidophilus  1 capsule Oral Daily   amLODipine  10 mg Oral QHS   atorvastatin  40 mg Oral QHS   carvedilol  12.5 mg Oral Q M,W,F-HD   carvedilol  12.5 mg Oral 2 times per day on Tue Thu Sat   Chlorhexidine Gluconate Cloth   6 each Topical Q0600   doxercalciferol  7 mcg Intravenous Q M,W,F-HD   feeding supplement  237 mL Oral BID BM   ferric citrate  420 mg Oral TID WC   fluticasone furoate-vilanterol  1 puff Inhalation Daily   gabapentin  100 mg Oral QHS   Gerhardt's butt cream   Topical TID   insulin aspart  0-6 Units Subcutaneous TID WC   isosorbide-hydrALAZINE  1 tablet Oral 2 times per day on Sun Tue Thu Sat   isosorbide-hydrALAZINE  1 tablet Oral Q M,W,F-HD   metroNIDAZOLE  500 mg Oral Q12H   multivitamin  1 tablet Oral QHS   pantoprazole  40 mg Oral BID   potassium chloride  20 mEq Oral Once   ursodiol  300 mg Oral BID   Continuous Infusions:   ceFAZolin (ANCEF) IV 2 g (08/21/22 1751)   heparin 700 Units/hr (08/22/22 0323)   PRN Meds:.albuterol, busPIRone, fluticasone, hydrALAZINE, ondansetron (ZOFRAN) IV, ondansetron, oxyCODONE, prochlorperazine, traZODone   I have personally reviewed following labs and imaging studies  LABORATORY DATA:  Recent Labs  Lab 08/16/22 0327 08/16/22 0650 08/17/22 0237 08/17/22 0715 08/18/22 0727 08/19/22 0853 08/20/22 0509 08/22/22 0307  WBC QUANTITY NOT SUFFICIENT, UNABLE TO PERFORM TEST 23.4*   < > 24.9* 21.7* 20.1* 16.9* 23.1*  HGB QUANTITY NOT SUFFICIENT, UNABLE TO PERFORM TEST 10.5*   < > 10.6* 11.3* 10.1* 11.3* 12.1  HCT QUANTITY NOT SUFFICIENT, UNABLE TO PERFORM TEST 32.5*   < > 32.5* 35.6* 32.1* 34.4* 37.5  PLT QUANTITY NOT SUFFICIENT, UNABLE TO PERFORM TEST  318   < > 329 338 350 314 273  MCV QUANTITY NOT SUFFICIENT, UNABLE TO PERFORM TEST 96.7   < > 96.2 97.5 99.1 97.2 99.2  MCH QUANTITY NOT SUFFICIENT, UNABLE TO PERFORM TEST 31.3   < > 31.4 31.0 31.2 31.9 32.0  MCHC QUANTITY NOT SUFFICIENT, UNABLE TO PERFORM TEST 32.3   < > 32.6 31.7 31.5 32.8 32.3  RDW QUANTITY NOT SUFFICIENT, UNABLE TO PERFORM TEST 13.8   < > 14.0 14.4 14.6 14.7 15.1  LYMPHSABS QUANTITY NOT SUFFICIENT, UNABLE TO PERFORM TEST RECOLLECT CBCD  CALLED TO TANYA DECAREAUS RN 0345  BTAYLOR 1.8  --   --  1.8  --   --   --   MONOABS QUANTITY NOT SUFFICIENT, UNABLE TO PERFORM TEST RECOLLECT CBCD  CALLED TO TANYA DECAREAUS RN 0345 BTAYLOR 1.7*  --   --  1.3*  --   --   --   EOSABS QUANTITY NOT SUFFICIENT, UNABLE TO PERFORM TEST RECOLLECT CBCD  CALLED TO TANYA DECAREAUS RN 0345 BTAYLOR 0.3  --   --  0.1  --   --   --   BASOSABS QUANTITY NOT SUFFICIENT, UNABLE TO PERFORM TEST RECOLLECT CBCD  CALLED TO TANYA DECAREAUS RN 0345 BTAYLOR 0.1  --   --  0.1  --   --   --    < > = values in this interval not displayed.    Recent Labs  Lab 08/17/22 0237 08/17/22 0715 08/19/22 0853 08/20/22 0509 08/22/22 0307  NA 131* 133* 134* 135 135  K 3.4* 3.9 2.6* 3.6 3.1*  CL 93* 94* 94* 95* 94*  CO2 '23 22 24 24 25  '$ ANIONGAP 15 17* 16* 16* 16*  GLUCOSE 172* 171* 173* 146* 178*  BUN '16 18 18 10 9  '$ CREATININE 4.99* 5.09* 4.80* 3.28* 2.95*  ALBUMIN 2.0* 2.0* 2.0* 2.2* 2.2*  CALCIUM 10.1 10.1 10.2 10.2 10.6*     LOS: 20 days   Signature  -    Lala Lund M.D on 08/22/2022 at 9:32 AM   -  To page go to [email or web address]

## 2022-08-22 NOTE — Progress Notes (Signed)
[facility] Progress Note   Subjective: Seen in room with husband at bedside. LOC waxing and waning today. Looks terrible.     Objective Vitals:   08/22/22 1000 08/22/22 1049 08/22/22 1100 08/22/22 1200  BP: 96/62 106/86  (!) 116/58  Pulse: 90  80 82  Resp: '20  17 13  '$ Temp:      TempSrc:      SpO2: 96%  92% 94%  Weight:      Height:       Physical Exam General: Chronically ill appearing female in NAD Heart: S1,,S2 + SEM 2/6  Lungs: CTAB decreased in bases. Abdomen: NT, NABS  Extremities: bilateral TMA stumps No LE edema Neuro: Oriented to person, place. Speech discombobulated at times but answers most questions appropriately. Can still sing.  Dialysis Access: L AVG +T/B   Additional Objective Labs: Basic Metabolic Panel: Recent Labs  Lab 08/19/22 0853 08/20/22 0509 08/22/22 0307  NA 134* 135 135  K 2.6* 3.6 3.1*  CL 94* 95* 94*  CO2 '24 24 25  '$ GLUCOSE 173* 146* 178*  BUN '18 10 9  '$ CREATININE 4.80* 3.28* 2.95*  CALCIUM 10.2 10.2 10.6*  PHOS 4.2 4.0 3.6   Liver Function Tests: Recent Labs  Lab 08/19/22 0853 08/20/22 0509 08/22/22 0307  ALBUMIN 2.0* 2.2* 2.2*   No results for input(s): "LIPASE", "AMYLASE" in the last 168 hours. CBC: Recent Labs  Lab 08/16/22 0327 08/16/22 0650 08/17/22 0237 08/17/22 0715 08/18/22 0727 08/19/22 0853 08/20/22 0509 08/22/22 0307  WBC QUANTITY NOT SUFFICIENT, UNABLE TO PERFORM TEST 23.4*   < > 24.9* 21.7* 20.1* 16.9* 23.1*  NEUTROABS QUANTITY NOT SUFFICIENT, UNABLE TO PERFORM TEST RECOLLECT CBCD  CALLED TO TANYA DECAREAUS RN 0345 BTAYLOR 18.8*  --   --  17.8*  --   --   --   HGB QUANTITY NOT SUFFICIENT, UNABLE TO PERFORM TEST 10.5*   < > 10.6* 11.3* 10.1* 11.3* 12.1  HCT QUANTITY NOT SUFFICIENT, UNABLE TO PERFORM TEST 32.5*   < > 32.5* 35.6* 32.1* 34.4* 37.5  MCV QUANTITY NOT SUFFICIENT, UNABLE TO PERFORM TEST 96.7   < > 96.2 97.5 99.1 97.2 99.2  PLT QUANTITY NOT SUFFICIENT, UNABLE TO PERFORM TEST 318   <  > 329 338 350 314 273   < > = values in this interval not displayed.   Blood Culture    Component Value Date/Time   SDES WOUND 08/03/2022 1326   SPECREQUEST  ABCESS ABDOMEN 08/03/2022 1326   CULT  08/03/2022 1326    ABUNDANT ESCHERICHIA COLI MODERATE KLEBSIELLA PNEUMONIAE ABUNDANT BACTEROIDES THETAIOTAOMICRON BETA LACTAMASE POSITIVE Performed at [facility], [place] [address] [place], [place] [address]    REPTSTATUS 08/08/2022 FINAL 08/03/2022 1326    Cardiac Enzymes: No results for input(s): "CKTOTAL", "CKMB", "CKMBINDEX", "TROPONINI" in the last 168 hours. CBG: Recent Labs  Lab 08/20/22 2156 08/21/22 0742 08/21/22 1609 08/21/22 2134 08/22/22 0924  GLUCAP 169* 192* 186* 176* 163*   Iron Studies: No results for input(s): "IRON", "TIBC", "TRANSFERRIN", "FERRITIN" in the last 72 hours. '@lablastinr3'$ @ Studies/Results: No results found. Medications:   ceFAZolin (ANCEF) IV 2 g (08/21/22 1751)   heparin 700 Units/hr (08/22/22 0323)    acidophilus  1 capsule Oral Daily   amLODipine  10 mg Oral QHS   atorvastatin  40 mg Oral QHS   [START ON 08/23/2022] carvedilol  3.125 mg Oral BID WC   Chlorhexidine Gluconate Cloth  6 each Topical Q0600   doxercalciferol  7  mcg Intravenous Q M,W,F-HD   feeding supplement  237 mL Oral BID BM   ferric citrate  420 mg Oral TID WC   fluticasone furoate-vilanterol  1 puff Inhalation Daily   gabapentin  100 mg Oral QHS   Gerhardt's butt cream   Topical TID   insulin aspart  0-6 Units Subcutaneous TID WC   [START ON 08/23/2022] isosorbide-hydrALAZINE  1 tablet Oral BID   metroNIDAZOLE  500 mg Oral Q12H   multivitamin  1 tablet Oral QHS   pantoprazole  40 mg Oral BID   ursodiol  300 mg Oral BID     Dialysis Orders: [place] MWF  4h  400/600   57.4kg   2/3.5 bath LUA AVG  Heparin 2500 units IV initial bolus+ 1000 units IV mid run - last HD 1/5, post wt 57.3kg - hectorol 7 ug IV tiw - venofer 50 q week - mircera 30 ug q4 wks, last 12/8,  due now   Assessment/Plan: Abd pain/ intra-abdominal infection or abscess/perforated appendicitis - per CT imaging. Gen surg and IR consulted, started on IV abx. IR placed drain in RLQ fluid collection. Fluid sent for culture, growing E coli/ Klebsiella. Bacteroides. Per pmd. 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 Repeat CT 1/24 - no new findings.  Per ID continue cefazolin 2 g IV q HD + Flagyl 500 BID until 09/17/22 PAD/critical limb ischemia w/rest pain - CTA on 1/15 w/occluded L fem-pop. On Heparin.  VVS consulting, if improvement noted in abscess on repeat CT, may proceed with endovascular intervention d/t concern for compromise of LE and expected long recovery time for abdominal abscess. Tentative plan is for angiogram next week.   ESRD - on HD MWF. HD on schedule. Next HD 08/23/2022 Hypokalemia - intermittent with diarrhea. diet liberalized but poor appetite. Encouraged nutrition. Supplementing prn. Improved.  HTN/ volume - Appears mostly euvolemic on exam.  Set UF goal 1-2L as tolerated. Bps have been high --Improved with resumed home amlodipine, carvedilol and bidil.  Hold pre HD d/t intra dialytic hypotension.  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 no ESA needed   MBD ckd - CCa elevated hold Hectorol, Phos in range.On Auryxia DM 2 - per pmd COPD/ asthma - no symptoms Encephalopahty: likely multifactorial.  Waxing and wanning. Stopped gabapentin. Ordered CT head to r/o stroke.  Nutrition - Alb low. Currently on regular diet.  Follow labs. If K^ will need to change to renal diet. Add protein supplements.   Dejanira Pamintuan H. Ahijah Devery NP-C 08/22/2022, 12:33 PM  [employer] [phone]

## 2022-08-22 NOTE — Progress Notes (Signed)
ANTICOAGULATION CONSULT NOTE  Pharmacy Consult for Heparin Indication:  Limb ischemia     Allergies  Allergen Reactions   Adhesive  [Tape] Hives   Lactose Intolerance (Gi) Diarrhea   Lisinopril Cough   Prednisone Swelling and Other (See Comments)    Excessive fluid buildup    Patient Measurements: Height: '5\' 5"'$  (165.1 cm) Weight: 54.3 kg (119 lb 11.4 oz) IBW/kg (Calculated) : 57  Vital Signs: Pulse Rate: 82 (01/27 0745)  Labs: Recent Labs    08/19/22 0853 08/20/22 0509 08/21/22 0717 08/22/22 0307  HGB 10.1* 11.3*  --  12.1  HCT 32.1* 34.4*  --  37.5  PLT 350 314  --  273  HEPARINUNFRC  --  0.77* 0.66 0.54  CREATININE 4.80* 3.28*  --  2.95*     Estimated Creatinine Clearance: 15 mL/min (A) (by C-G formula based on SCr of 2.95 mg/dL (H)).   Assessment: 72 year old female continues heparin for limb ischemia.  Planning to pursue revascularization once pelvic abscess has improved/resolved.   Heparin level therapeutic at 0.57 this AM, Hgb 12.1, plts 273  Goal of Therapy:  Heparin level 0.3-0.7 units/ml Monitor platelets by anticoagulation protocol: Yes   Plan:  Continue heparin at 700 units / hr Continue daily levels  Monitor for signs and symptoms of bleeding.   Titus Dubin, PharmD PGY1 Pharmacy Resident 08/22/2022 8:17 AM

## 2022-08-23 DIAGNOSIS — K659 Peritonitis, unspecified: Secondary | ICD-10-CM | POA: Diagnosis not present

## 2022-08-23 LAB — CK: Total CK: 275 U/L — ABNORMAL HIGH (ref 38–234)

## 2022-08-23 LAB — CBC
HCT: 35.1 % — ABNORMAL LOW (ref 36.0–46.0)
Hemoglobin: 11.2 g/dL — ABNORMAL LOW (ref 12.0–15.0)
MCH: 31.6 pg (ref 26.0–34.0)
MCHC: 31.9 g/dL (ref 30.0–36.0)
MCV: 99.2 fL (ref 80.0–100.0)
Platelets: 229 10*3/uL (ref 150–400)
RBC: 3.54 MIL/uL — ABNORMAL LOW (ref 3.87–5.11)
RDW: 15.1 % (ref 11.5–15.5)
WBC: 18.9 10*3/uL — ABNORMAL HIGH (ref 4.0–10.5)
nRBC: 0.2 % (ref 0.0–0.2)

## 2022-08-23 LAB — GLUCOSE, CAPILLARY
Glucose-Capillary: 155 mg/dL — ABNORMAL HIGH (ref 70–99)
Glucose-Capillary: 158 mg/dL — ABNORMAL HIGH (ref 70–99)
Glucose-Capillary: 141 mg/dL — ABNORMAL HIGH (ref 70–99)
Glucose-Capillary: 156 mg/dL — ABNORMAL HIGH (ref 70–99)
Glucose-Capillary: 138 mg/dL — ABNORMAL HIGH (ref 70–99)

## 2022-08-23 LAB — HEPARIN LEVEL (UNFRACTIONATED): Heparin Unfractionated: 0.42 IU/mL (ref 0.30–0.70)

## 2022-08-23 NOTE — Progress Notes (Addendum)
  Progress Note    08/23/2022 9:21 AM * No surgery date entered *  Subjective:  worsening pain L foot and leg   Vitals:   08/23/22 0436 08/23/22 0800  BP: (!) 159/60 (!) 157/66  Pulse: 93 95  Resp: 14   Temp: 97.9 F (36.6 C) 99 F (37.2 C)  SpO2: 91% 93%   Physical Exam: Lungs:  non labored Extremities:  L foot and lower leg dark and cold to touch with severe pain to light palpation of calf muscle Neurologic: A&O  CBC    Component Value Date/Time   WBC 18.9 (H) 08/23/2022 0247   RBC 3.54 (L) 08/23/2022 0247   HGB 11.2 (L) 08/23/2022 0247   HGB 11.2 12/08/2019 1300   HCT 35.1 (L) 08/23/2022 0247   HCT 32.1 (L) 12/08/2019 1300   PLT 229 08/23/2022 0247   PLT 269 12/08/2019 1300   MCV 99.2 08/23/2022 0247   MCV 92 12/08/2019 1300   MCH 31.6 08/23/2022 0247   MCHC 31.9 08/23/2022 0247   RDW 15.1 08/23/2022 0247   RDW 12.2 12/08/2019 1300   LYMPHSABS 1.8 08/18/2022 0727   LYMPHSABS 2.0 12/08/2019 1300   MONOABS 1.3 (H) 08/18/2022 0727   EOSABS 0.1 08/18/2022 0727   EOSABS 0.4 12/08/2019 1300   BASOSABS 0.1 08/18/2022 0727   BASOSABS 0.0 12/08/2019 1300    BMET    Component Value Date/Time   NA 135 08/22/2022 0307   NA 135 (A) 07/15/2016 0000   K 3.1 (L) 08/22/2022 0307   CL 94 (L) 08/22/2022 0307   CO2 25 08/22/2022 0307   GLUCOSE 178 (H) 08/22/2022 0307   BUN 9 08/22/2022 0307   BUN 30 (A) 07/15/2016 0000   CREATININE 2.95 (H) 08/22/2022 0307   CALCIUM 10.6 (H) 08/22/2022 0307   GFRNONAA 16 (L) 08/22/2022 0307   GFRAA 6 (L) 06/26/2019 0500    INR    Component Value Date/Time   INR 1.0 08/03/2022 0422     Intake/Output Summary (Last 24 hours) at 08/23/2022 1751 Last data filed at 08/22/2022 2011 Gross per 24 hour  Intake 0 ml  Output 0 ml  Net 0 ml     Assessment/Plan:  72 y.o. female with ischemic L leg  L foot and lower leg cold with severe pain to touch in calf; concern for necrotic muscle; check CK Given degree of ischemia in LLE,  patient would be best served with L AKA instead of angiogram This was discussed with the patient and she is in agreement L AKA tomorrow with Dr. Scot Dock Npo past midnight Consent ordered   Destiny Ligas, PA-C [facility] [phone] 08/23/2022 9:21 AM  I have independently interviewed and examined patient and agree with PA assessment and plan above.  Patient having severe pain in the left leg and ability to bend the left knee today.  There is a very weakly palpable left common femoral pulse.  I reviewed her angiogram going back to 2018 had a patent left femoropopliteal bypass at the time she had very diminutive distal tibial vessels with long segment SFA occlusion.  I am unsure that angiography will be available for her and I discussed proceeding with left above-knee amputation with both the patient and the primary team and they are in agreement and I will have this scheduled for tomorrow.  Anamae Rochelle C. Donzetta Matters, MD [facility] Office: [phone] Pager: [phone]

## 2022-08-23 NOTE — Progress Notes (Signed)
[facility] Progress Note   Subjective: Seen in room. Oriented to self only today. Plans for L AKA tomorrow. No family present.      Objective Vitals:   08/22/22 1800 08/22/22 2011 08/23/22 0436 08/23/22 0800  BP:  (!) 142/50 (!) 159/60 (!) 157/66  Pulse: 85 82 93 95  Resp: '14 11 14   '$ Temp:  98.2 F (36.8 C) 97.9 F (36.6 C) 99 F (37.2 C)  TempSrc:  Axillary Axillary Oral  SpO2: 90% 92% 91% 93%  Weight:      Height:       Physical Exam General: Chronically ill appearing female in NAD Heart: S1,,S2 + SEM 2/6  Lungs: CTAB decreased in bases. Abdomen: NT, NABS  Extremities: bilateral TMA stumps. LLE tender, cool to touch. No LE edema Neuro: Oriented to person only.   Dialysis Access: L AVG +T/B    Additional Objective Labs: Basic Metabolic Panel: Recent Labs  Lab 08/19/22 0853 08/20/22 0509 08/22/22 0307  NA 134* 135 135  K 2.6* 3.6 3.1*  CL 94* 95* 94*  CO2 '24 24 25  '$ GLUCOSE 173* 146* 178*  BUN '18 10 9  '$ CREATININE 4.80* 3.28* 2.95*  CALCIUM 10.2 10.2 10.6*  PHOS 4.2 4.0 3.6   Liver Function Tests: Recent Labs  Lab 08/19/22 0853 08/20/22 0509 08/22/22 0307  ALBUMIN 2.0* 2.2* 2.2*   No results for input(s): "LIPASE", "AMYLASE" in the last 168 hours. CBC: Recent Labs  Lab 08/18/22 0727 08/19/22 0853 08/20/22 0509 08/22/22 0307 08/23/22 0247  WBC 21.7* 20.1* 16.9* 23.1* 18.9*  NEUTROABS 17.8*  --   --   --   --   HGB 11.3* 10.1* 11.3* 12.1 11.2*  HCT 35.6* 32.1* 34.4* 37.5 35.1*  MCV 97.5 99.1 97.2 99.2 99.2  PLT 338 350 314 273 229   Blood Culture    Component Value Date/Time   SDES WOUND 08/03/2022 1326   SPECREQUEST  ABCESS ABDOMEN 08/03/2022 1326   CULT  08/03/2022 1326    ABUNDANT ESCHERICHIA COLI MODERATE KLEBSIELLA PNEUMONIAE ABUNDANT BACTEROIDES THETAIOTAOMICRON BETA LACTAMASE POSITIVE Performed at [facility], [place] [address], [place],  [address]    REPTSTATUS 08/08/2022 FINAL 08/03/2022 1326     Cardiac Enzymes: No results for input(s): "CKTOTAL", "CKMB", "CKMBINDEX", "TROPONINI" in the last 168 hours. CBG: Recent Labs  Lab 08/22/22 1330 08/22/22 1659 08/22/22 2201 08/23/22 0840 08/23/22 0856  GLUCAP 249* 206* 134* 155* 158*   Iron Studies: No results for input(s): "IRON", "TIBC", "TRANSFERRIN", "FERRITIN" in the last 72 hours. '@lablastinr3'$ @ Studies/Results: No results found. Medications:   ceFAZolin (ANCEF) IV 2 g (08/21/22 1751)   heparin 700 Units/hr (08/22/22 0323)    acidophilus  1 capsule Oral Daily   amLODipine  10 mg Oral QHS   atorvastatin  40 mg Oral QHS   carvedilol  3.125 mg Oral BID WC   Chlorhexidine Gluconate Cloth  6 each Topical Q0600   doxercalciferol  7 mcg Intravenous Q M,W,F-HD   feeding supplement  237 mL Oral BID BM   ferric citrate  420 mg Oral TID WC   fluticasone furoate-vilanterol  1 puff Inhalation Daily   gabapentin  100 mg Oral QHS   Gerhardt's butt cream   Topical TID   insulin aspart  0-6 Units Subcutaneous TID WC   isosorbide-hydrALAZINE  1 tablet Oral BID   metroNIDAZOLE  500 mg Oral Q12H   multivitamin  1 tablet Oral QHS   pantoprazole  40 mg Oral BID  ursodiol  300 mg Oral BID     Dialysis Orders: [place] MWF  4h  400/600   57.4kg   2/3.5 bath LUA AVG  Heparin 2500 units IV initial bolus+ 1000 units IV mid run - last HD 1/5, post wt 57.3kg - hectorol 7 ug IV tiw - venofer 50 q week - mircera 30 ug q4 wks, last 12/8, due now   Assessment/Plan: Abd pain/ intra-abdominal infection or abscess/perforated appendicitis - per CT imaging. Gen surg and IR consulted, started on IV abx. IR placed drain in RLQ fluid collection. Fluid sent for culture, growing E coli/ Klebsiella. Bacteroides. Per pmd. 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  Repeat CT 1/24 - no new findings.  Per ID continue cefazolin 2 g IV q HD + Flagyl 500 BID until 09/17/22 PAD/critical limb ischemia w/rest pain - CTA on 1/15 w/occluded L fem-pop. On Heparin.  VVS consulting, if improvement noted in abscess on repeat CT, may proceed with endovascular intervention d/t concern for compromise of LE and expected long recovery time for abdominal abscess. Tentative plan is for angiogram next week.   ESRD - on HD MWF. HD on schedule. Next HD 07/30/2022. Will need to coordinate with OR schedule. Hypokalemia - intermittent with diarrhea. diet liberalized but poor appetite. Encouraged nutrition. Supplementing prn. Improved.  HTN/ volume - Appears mostly euvolemic on exam.  Set UF goal 1-2L as tolerated. Bps have been high --Improved with resumed home amlodipine, carvedilol and bidil.  Hold pre HD d/t intra dialytic hypotension.  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 no ESA needed   MBD ckd - CCa elevated hold Hectorol, Phos in range.On Auryxia DM 2 - per pmd COPD/ asthma - no symptoms Encephalopahty: likely multifactorial.  Waxing and wanning. Stopped gabapentin. Ordered CT head to r/o stroke.  Nutrition - Alb low. Currently on regular diet.  Follow labs. If K^ will need to change to renal diet. Add protein supplements.   Tamari Busic H. Francena Zender NP-C 08/23/2022, 10:01 AM  [employer] [phone]

## 2022-08-23 NOTE — Progress Notes (Addendum)
TRH night cross cover note:   I was notified by RN that patient is to undergo left AKA tomorrow (1/29) and that she has heparin drip infusing.   Procedure scheduled for 0906 on AM of 1/29. Heparin drip to be held starting at 0300 on 1/29 in anticipation of this procedure. Heparin drip order modified to reflect this.     Babs Bertin, DO Hospitalist

## 2022-08-23 NOTE — Progress Notes (Signed)
PROGRESS NOTE        PATIENT DETAILS Name: Destiny Day Age: 72 y.o. Sex: female Date of Birth: [birth year] Admit Date: 08/04/2022 Admitting Physician Lorrin Goodell, MD WUJ:[id number], Rachel Moulds, NP  Brief Summary: Patient is a 72 y.o.  female with history of ESRD on HD MWF, chronic HFpEF, PAD-s/p bilateral TMA who presented with RLQ pain-found to have perforated appendicitis with abscess.  Hospital course complicated by worsening left lower extremity rest pain due to critical limb ischemia.  See below for further details.  Significant events: 1/07>> admitted TRH-RLQ pain-subsequently found to have perforated appendicitis with abscess.  Significant studies: 1/07>> RUQ ultrasound: Cholelithiasis with positive sonographic Murphy sign. 1/07>> CT abdomen/pelvis: Findings suspicious for perforated appendicitis with periappendiceal abscess 1/11>> CT abdomen/pelvis: Interval decrease in size of the right pelvic abscess-s/p percutaneous drainage. 1/15>> HIDA scan: No evidence of acute cholecystitis. 1/15>> CT angio abdominal aorta: Severe atherosclerotic disease throughout abdomen/pelvis and bilateral lower extremities.  Severe left outflow disease with occlusion of left femoral-popliteal bypass graft, occlusion of native left SFA.  Complex collection in the right hemipelvis-enlarged since 1/11 study. 1/22>> CT head:No acute abnormality 1/24>> CT abdomen/pelvis: Drainage catheter in place-in the region of complex abnormality in the right pelvic sidewall, residual component 4.9 x 5.3 x 4.7 cm.  No clinical worsening.  Significant microbiology data: 1/08>> appendix abscess culture: E. coli/Klebsiella  Procedures: 1/08>> CT-guided drainage of right pelvic abscess 1/17>> drain check by IR  Consults: IR ID Vascular surgery General surgery Nephrology Pall Care  Subjective:  Patient in bed, appears comfortable, denies any headache, no fever, no chest pain or  pressure, no shortness of breath , no abdominal pain. No new focal weakness.  Improved left leg pain.   Objective: Vitals: Blood pressure (!) 157/66, pulse 95, temperature 99 F (37.2 C), temperature source Oral, resp. rate 14, height '5\' 5"'$  (1.651 m), weight 54.9 kg, SpO2 93 %.   Exam:  Awake Alert, No new F.N deficits, Normal affect [place].AT,PERRAL Supple Neck, No JVD,   Symmetrical Chest wall movement, Good air movement bilaterally, CTAB RRR,No Gallops, Rubs or new Murmurs,  +ve B.Sounds, right lower quadrant abdominal drain, bilateral TMA stumps, left leg mildly cold as compared to the right.      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   Leukocytosis now slowly downtrending Repeat CT-with some residual collection but no new findings. ID switched antibiotics-current recommendations are for cefazolin (given with HD) and Flagyl-end date 09/17/22.  Needs weekly CBC/CMP/CRP.  ID follow-up arranged for 2/20. General surgery continues to recommend supportive care.    Critical left limb ischemia with rest pain PAD-s/p left femoral-popliteal bypass graft CT angio with occluded left femoral to below-knee popliteal artery bypass Remains on IV heparin Seen by vascular surgery, case discussed with vascular surgeon Dr.Cain on 08/23/2022 patient will likely not benefit from revascularization and will benefit from a left AKA for which she is agreeable, likely surgery on 08/23/2022, patient has multiple advanced medical problems with poor quality of life and poor long-term prognosis, will involve palliative care for goals of care as well, patient agreeable for blood transfusion if needed during the surgery.   ESRD on HD MWF Nephrology following  and directing  care   Hypokalemia Repleted.   Acute metabolic encephalopathy Hospital delirium In the setting of acute illness/appendix abscess Overall better but with some intermittent delirium. CT head done on 1/22 at family request-no acute abnormality seen. Maintain delirium precautions   HFrEF Euvolemic Volume removal with HD   HLD Continue statin   HTN Pressure running low will adjust blood pressure medications on 08/22/2022.   Neuropathic pain Rest  pain associated PAD Neurontin   Anxiety Continue with BuSpar as needed   Asthma COPD overlap No wheezing Continue bronchodilators   Complex right ovarian mass Outpatient GYN follow-up-patient/spouse aware of this recommendation-potential malignancy   Left upper pole renal lesion Concern for malignancy-needs outpatient follow-up with urology for further workup   Debility/deconditioning PT/OT eval Home health recommended   DM-2 (A1c 6.2 on 1/8) CBGs stable with SSI  Recent Labs    08/22/22 2201 08/23/22 0840 08/23/22 0856  GLUCAP 134* 155* 158*    Nutrition Status: Nutrition Problem: Moderate Malnutrition Etiology: chronic illness Signs/Symptoms: severe muscle depletion, moderate fat depletion Interventions: Nepro shake, MVI, Liberalize Diet  BMI: Estimated body mass index is 20.14 kg/m as calculated from the following:   Height as of this encounter: '5\' 5"'$  (1.651 m).   Weight as of this encounter: 54.9 kg.   Code status:   Code Status: Full Code   DVT Prophylaxis: SCDs Start: 08/26/2022 2213 IV heparin   Family Communication: Spouse [name]-[phone] updated 1/26   Disposition Plan: Status is: Inpatient Remains inpatient appropriate because: Severity of illness.   Planned Discharge Destination:Home health   Diet: Diet Order             Diet NPO time specified  Diet effective midnight           DIET - DYS 1 Room service appropriate? No; Fluid consistency: Thin  Diet effective now                     MEDICATIONS: Scheduled Meds:  acidophilus  1 capsule Oral Daily   amLODipine  10 mg Oral QHS   atorvastatin  40 mg Oral QHS   carvedilol  3.125 mg Oral BID WC   Chlorhexidine Gluconate Cloth  6 each Topical Q0600   doxercalciferol  7 mcg Intravenous Q M,W,F-HD   feeding supplement  237 mL Oral BID BM   ferric citrate  420 mg Oral TID WC   fluticasone furoate-vilanterol  1 puff Inhalation Daily   gabapentin  100 mg Oral QHS   Gerhardt's butt cream   Topical TID   insulin aspart  0-6 Units Subcutaneous TID WC   isosorbide-hydrALAZINE  1 tablet Oral BID   metroNIDAZOLE  500 mg Oral Q12H   multivitamin  1 tablet Oral QHS   pantoprazole  40 mg Oral BID   ursodiol  300 mg Oral BID   Continuous Infusions:   ceFAZolin (ANCEF) IV 2 g (08/21/22 1751)   heparin 700 Units/hr (08/22/22 0323)   PRN Meds:.albuterol, busPIRone, fluticasone, hydrALAZINE, ondansetron (ZOFRAN) IV, ondansetron, oxyCODONE, prochlorperazine, traZODone   I have personally reviewed following labs and imaging studies  LABORATORY DATA:  Recent Labs  Lab 08/18/22 0727 08/19/22 0853 08/20/22 0509 08/22/22 0307 08/23/22 0247  WBC 21.7* 20.1* 16.9* 23.1* 18.9*  HGB 11.3* 10.1* 11.3* 12.1 11.2*  HCT 35.6* 32.1* 34.4* 37.5 35.1*  PLT 338 350 314 273 229  MCV 97.5 99.1 97.2 99.2 99.2  MCH 31.0 31.2 31.9 32.0 31.6  MCHC 31.7 31.5 32.8  32.3 31.9  RDW 14.4 14.6 14.7 15.1 15.1  LYMPHSABS 1.8  --   --   --   --   MONOABS 1.3*  --   --   --   --   EOSABS 0.1  --   --   --   --   BASOSABS 0.1  --   --   --   --     Recent Labs  Lab 08/17/22 0237 08/17/22 0715 08/19/22 0853 08/20/22 0509 08/22/22 0307  NA 131* 133* 134* 135 135  K 3.4* 3.9 2.6* 3.6 3.1*  CL 93* 94* 94* 95* 94*  CO2 '23 22 24 24 25  '$ ANIONGAP 15 17* 16* 16* 16*  GLUCOSE 172* 171* 173* 146* 178*  BUN '16 18 18 10 9  '$ CREATININE 4.99* 5.09* 4.80* 3.28* 2.95*  ALBUMIN 2.0* 2.0* 2.0* 2.2* 2.2*  CALCIUM 10.1 10.1 10.2 10.2  10.6*     LOS: 21 days   Signature  -    Lala Lund M.D on 08/23/2022 at 10:31 AM   -  To page go to [email or web address]

## 2022-08-23 NOTE — H&P (View-Only) (Signed)
  Progress Note    08/23/2022 9:21 AM * No surgery date entered *  Subjective:  worsening pain L foot and leg   Vitals:   08/23/22 0436 08/23/22 0800  BP: (!) 159/60 (!) 157/66  Pulse: 93 95  Resp: 14   Temp: 97.9 F (36.6 C) 99 F (37.2 C)  SpO2: 91% 93%   Physical Exam: Lungs:  non labored Extremities:  L foot and lower leg dark and cold to touch with severe pain to light palpation of calf muscle Neurologic: A&O  CBC    Component Value Date/Time   WBC 18.9 (H) 08/23/2022 0247   RBC 3.54 (L) 08/23/2022 0247   HGB 11.2 (L) 08/23/2022 0247   HGB 11.2 12/08/2019 1300   HCT 35.1 (L) 08/23/2022 0247   HCT 32.1 (L) 12/08/2019 1300   PLT 229 08/23/2022 0247   PLT 269 12/08/2019 1300   MCV 99.2 08/23/2022 0247   MCV 92 12/08/2019 1300   MCH 31.6 08/23/2022 0247   MCHC 31.9 08/23/2022 0247   RDW 15.1 08/23/2022 0247   RDW 12.2 12/08/2019 1300   LYMPHSABS 1.8 08/18/2022 0727   LYMPHSABS 2.0 12/08/2019 1300   MONOABS 1.3 (H) 08/18/2022 0727   EOSABS 0.1 08/18/2022 0727   EOSABS 0.4 12/08/2019 1300   BASOSABS 0.1 08/18/2022 0727   BASOSABS 0.0 12/08/2019 1300    BMET    Component Value Date/Time   NA 135 08/22/2022 0307   NA 135 (A) 07/15/2016 0000   K 3.1 (L) 08/22/2022 0307   CL 94 (L) 08/22/2022 0307   CO2 25 08/22/2022 0307   GLUCOSE 178 (H) 08/22/2022 0307   BUN 9 08/22/2022 0307   BUN 30 (A) 07/15/2016 0000   CREATININE 2.95 (H) 08/22/2022 0307   CALCIUM 10.6 (H) 08/22/2022 0307   GFRNONAA 16 (L) 08/22/2022 0307   GFRAA 6 (L) 06/26/2019 0500    INR    Component Value Date/Time   INR 1.0 08/03/2022 0422     Intake/Output Summary (Last 24 hours) at 08/23/2022 4332 Last data filed at 08/22/2022 2011 Gross per 24 hour  Intake 0 ml  Output 0 ml  Net 0 ml     Assessment/Plan:  72 y.o. female with ischemic L leg  L foot and lower leg cold with severe pain to touch in calf; concern for necrotic muscle; check CK Given degree of ischemia in LLE,  patient would be best served with L AKA instead of angiogram This was discussed with the patient and she is in agreement L AKA tomorrow with Dr. Scot Dock Npo past midnight Consent ordered   Dagoberto Ligas, PA-C [facility] [phone] 08/23/2022 9:21 AM  I have independently interviewed and examined patient and agree with PA assessment and plan above.  Patient having severe pain in the left leg and ability to bend the left knee today.  There is a very weakly palpable left common femoral pulse.  I reviewed her angiogram going back to 2018 had a patent left femoropopliteal bypass at the time she had very diminutive distal tibial vessels with long segment SFA occlusion.  I am unsure that angiography will be available for her and I discussed proceeding with left above-knee amputation with both the patient and the primary team and they are in agreement and I will have this scheduled for tomorrow.  Alison Breeding C. Donzetta Matters, MD [facility] [place] Office: [phone] Pager: [phone]

## 2022-08-23 NOTE — Progress Notes (Signed)
ANTICOAGULATION CONSULT NOTE  Pharmacy Consult for Heparin Indication:  Limb ischemia     Allergies  Allergen Reactions   Adhesive  [Tape] Hives   Lactose Intolerance (Gi) Diarrhea   Lisinopril Cough   Prednisone Swelling and Other (See Comments)    Excessive fluid buildup    Patient Measurements: Height: '5\' 5"'$  (165.1 cm) Weight: 54.9 kg (121 lb 0.5 oz) IBW/kg (Calculated) : 57  Vital Signs: Temp: 97.9 F (36.6 C) (01/28 0436) Temp Source: Axillary (01/28 0436) BP: 159/60 (01/28 0436) Pulse Rate: 93 (01/28 0436)  Labs: Recent Labs    08/21/22 0717 08/22/22 0307 08/23/22 0247  HGB  --  12.1 11.2*  HCT  --  37.5 35.1*  PLT  --  273 229  HEPARINUNFRC 0.66 0.54 0.42  CREATININE  --  2.95*  --      Estimated Creatinine Clearance: 15.2 mL/min (A) (by C-G formula based on SCr of 2.95 mg/dL (H)).   Assessment: 72 year old female continues heparin for limb ischemia.  Planning to pursue revascularization once pelvic abscess has improved/resolved.   Heparin level therapeutic at 0.42 this AM, Hgb 11.2, plts 229  Goal of Therapy:  Heparin level 0.3-0.7 units/ml Monitor platelets by anticoagulation protocol: Yes   Plan:  Continue heparin at 700 units / hr Continue daily levels  Monitor for signs and symptoms of bleeding.   Titus Dubin, PharmD PGY1 Pharmacy Resident 08/23/2022 7:42 AM

## 2022-08-24 ENCOUNTER — Inpatient Hospital Stay (HOSPITAL_COMMUNITY): Payer: Medicare Other | Admitting: Anesthesiology

## 2022-08-24 ENCOUNTER — Encounter (HOSPITAL_COMMUNITY): Admission: EM | Disposition: E | Payer: Self-pay | Source: Home / Self Care | Attending: Internal Medicine

## 2022-08-24 ENCOUNTER — Other Ambulatory Visit: Payer: Self-pay

## 2022-08-24 ENCOUNTER — Telehealth (HOSPITAL_COMMUNITY): Payer: Self-pay | Admitting: Pharmacy Technician

## 2022-08-24 ENCOUNTER — Other Ambulatory Visit (HOSPITAL_COMMUNITY): Payer: Self-pay

## 2022-08-24 ENCOUNTER — Encounter (HOSPITAL_COMMUNITY): Payer: Self-pay

## 2022-08-24 ENCOUNTER — Encounter (HOSPITAL_COMMUNITY): Payer: Self-pay | Admitting: Family Medicine

## 2022-08-24 DIAGNOSIS — I509 Heart failure, unspecified: Secondary | ICD-10-CM | POA: Diagnosis not present

## 2022-08-24 DIAGNOSIS — D631 Anemia in chronic kidney disease: Secondary | ICD-10-CM

## 2022-08-24 DIAGNOSIS — I998 Other disorder of circulatory system: Secondary | ICD-10-CM | POA: Diagnosis not present

## 2022-08-24 DIAGNOSIS — Z87891 Personal history of nicotine dependence: Secondary | ICD-10-CM

## 2022-08-24 DIAGNOSIS — K659 Peritonitis, unspecified: Secondary | ICD-10-CM | POA: Diagnosis not present

## 2022-08-24 DIAGNOSIS — Z7984 Long term (current) use of oral hypoglycemic drugs: Secondary | ICD-10-CM

## 2022-08-24 DIAGNOSIS — I130 Hypertensive heart and chronic kidney disease with heart failure and stage 1 through stage 4 chronic kidney disease, or unspecified chronic kidney disease: Secondary | ICD-10-CM

## 2022-08-24 DIAGNOSIS — E1122 Type 2 diabetes mellitus with diabetic chronic kidney disease: Secondary | ICD-10-CM

## 2022-08-24 DIAGNOSIS — N189 Chronic kidney disease, unspecified: Secondary | ICD-10-CM

## 2022-08-24 HISTORY — PX: AMPUTATION: SHX166

## 2022-08-24 LAB — POCT I-STAT, CHEM 8
BUN: 29 mg/dL — ABNORMAL HIGH (ref 8–23)
Calcium, Ion: 1.43 mmol/L — ABNORMAL HIGH (ref 1.15–1.40)
Chloride: 96 mmol/L — ABNORMAL LOW (ref 98–111)
Creatinine, Ser: 6.6 mg/dL — ABNORMAL HIGH (ref 0.44–1.00)
Glucose, Bld: 152 mg/dL — ABNORMAL HIGH (ref 70–99)
HCT: 34 % — ABNORMAL LOW (ref 36.0–46.0)
Hemoglobin: 11.6 g/dL — ABNORMAL LOW (ref 12.0–15.0)
Potassium: 3.4 mmol/L — ABNORMAL LOW (ref 3.5–5.1)
Sodium: 135 mmol/L (ref 135–145)
TCO2: 26 mmol/L (ref 22–32)

## 2022-08-24 LAB — GLUCOSE, CAPILLARY
Glucose-Capillary: 164 mg/dL — ABNORMAL HIGH (ref 70–99)
Glucose-Capillary: 169 mg/dL — ABNORMAL HIGH (ref 70–99)
Glucose-Capillary: 118 mg/dL — ABNORMAL HIGH (ref 70–99)
Glucose-Capillary: 105 mg/dL — ABNORMAL HIGH (ref 70–99)
Glucose-Capillary: 103 mg/dL — ABNORMAL HIGH (ref 70–99)

## 2022-08-24 LAB — RENAL FUNCTION PANEL
Albumin: 2.1 g/dL — ABNORMAL LOW (ref 3.5–5.0)
Anion gap: 20 — ABNORMAL HIGH (ref 5–15)
BUN: 29 mg/dL — ABNORMAL HIGH (ref 8–23)
CO2: 20 mmol/L — ABNORMAL LOW (ref 22–32)
Calcium: 10.9 mg/dL — ABNORMAL HIGH (ref 8.9–10.3)
Chloride: 95 mmol/L — ABNORMAL LOW (ref 98–111)
Creatinine, Ser: 6.28 mg/dL — ABNORMAL HIGH (ref 0.44–1.00)
GFR, Estimated: 7 mL/min — ABNORMAL LOW (ref >60)
Glucose, Bld: 146 mg/dL — ABNORMAL HIGH (ref 70–99)
Phosphorus: 4.5 mg/dL (ref 2.5–4.6)
Potassium: 3.8 mmol/L (ref 3.5–5.1)
Sodium: 135 mmol/L (ref 135–145)

## 2022-08-24 LAB — CBC
HCT: 34.1 % — ABNORMAL LOW (ref 36.0–46.0)
HCT: 35.5 % — ABNORMAL LOW (ref 36.0–46.0)
Hemoglobin: 11 g/dL — ABNORMAL LOW (ref 12.0–15.0)
Hemoglobin: 11.5 g/dL — ABNORMAL LOW (ref 12.0–15.0)
MCH: 31.5 pg (ref 26.0–34.0)
MCH: 32 pg (ref 26.0–34.0)
MCHC: 32.3 g/dL (ref 30.0–36.0)
MCHC: 32.4 g/dL (ref 30.0–36.0)
MCV: 97.7 fL (ref 80.0–100.0)
MCV: 98.9 fL (ref 80.0–100.0)
Platelets: 199 10*3/uL (ref 150–400)
Platelets: 191 10*3/uL (ref 150–400)
RBC: 3.49 MIL/uL — ABNORMAL LOW (ref 3.87–5.11)
RBC: 3.59 MIL/uL — ABNORMAL LOW (ref 3.87–5.11)
RDW: 15.4 % (ref 11.5–15.5)
RDW: 15.4 % (ref 11.5–15.5)
WBC: 16.2 10*3/uL — ABNORMAL HIGH (ref 4.0–10.5)
WBC: 13.9 10*3/uL — ABNORMAL HIGH (ref 4.0–10.5)
nRBC: 0.2 % (ref 0.0–0.2)
nRBC: 0.1 % (ref 0.0–0.2)

## 2022-08-24 SURGERY — AMPUTATION, ABOVE KNEE
Anesthesia: General | Site: Knee | Laterality: Left

## 2022-08-24 SURGERY — ABDOMINAL AORTOGRAM W/LOWER EXTREMITY
Anesthesia: LOCAL

## 2022-08-24 MED ORDER — PROPOFOL 10 MG/ML IV BOLUS
INTRAVENOUS | Status: DC | PRN
  Administered 2022-08-24: 80 mg via INTRAVENOUS

## 2022-08-24 MED ORDER — HYDROMORPHONE HCL 1 MG/ML IJ SOLN
INTRAMUSCULAR | Status: AC
  Filled 2022-08-24: qty 1

## 2022-08-24 MED ORDER — PROPOFOL 10 MG/ML IV BOLUS
INTRAVENOUS | Status: AC
  Filled 2022-08-24: qty 20

## 2022-08-24 MED ORDER — SUGAMMADEX SODIUM 200 MG/2ML IV SOLN
INTRAVENOUS | Status: DC | PRN
  Administered 2022-08-24: 200 mg via INTRAVENOUS

## 2022-08-24 MED ORDER — 0.9 % SODIUM CHLORIDE (POUR BTL) OPTIME
TOPICAL | Status: DC | PRN
  Administered 2022-08-24: 1000 mL

## 2022-08-24 MED ORDER — LIDOCAINE 2% (20 MG/ML) 5 ML SYRINGE
INTRAMUSCULAR | Status: DC | PRN
  Administered 2022-08-24: 80 mg via INTRAVENOUS

## 2022-08-24 MED ORDER — LIDOCAINE-PRILOCAINE 2.5-2.5 % EX CREA: 1.0000 | TOPICAL_CREAM | CUTANEOUS | Status: DC | PRN

## 2022-08-24 MED ORDER — ONDANSETRON HCL 4 MG/2ML IJ SOLN
INTRAMUSCULAR | Status: AC
  Filled 2022-08-24: qty 2

## 2022-08-24 MED ORDER — LIDOCAINE 2% (20 MG/ML) 5 ML SYRINGE
INTRAMUSCULAR | Status: AC
  Filled 2022-08-24: qty 5

## 2022-08-24 MED ORDER — OXYCODONE HCL 5 MG/5ML PO SOLN: 5.0000 mg | Freq: Once | ORAL | Status: DC | PRN

## 2022-08-24 MED ORDER — ROCURONIUM BROMIDE 10 MG/ML (PF) SYRINGE
PREFILLED_SYRINGE | INTRAVENOUS | Status: DC | PRN
  Administered 2022-08-24: 50 mg via INTRAVENOUS

## 2022-08-24 MED ORDER — ONDANSETRON HCL 4 MG/2ML IJ SOLN: 4.0000 mg | Freq: Once | INTRAMUSCULAR | Status: DC | PRN

## 2022-08-24 MED ORDER — OXYCODONE HCL 5 MG PO TABS: 5.0000 mg | ORAL_TABLET | Freq: Once | ORAL | Status: DC | PRN

## 2022-08-24 MED ORDER — ORAL CARE MOUTH RINSE: 15.0000 mL | Freq: Once | OROMUCOSAL | Status: AC

## 2022-08-24 MED ORDER — CHLORHEXIDINE GLUCONATE 0.12 % MT SOLN
OROMUCOSAL | Status: AC
  Administered 2022-08-24: 15 mL via OROMUCOSAL
  Filled 2022-08-24: qty 15

## 2022-08-24 MED ORDER — ROCURONIUM BROMIDE 10 MG/ML (PF) SYRINGE
PREFILLED_SYRINGE | INTRAVENOUS | Status: AC
  Filled 2022-08-24: qty 10

## 2022-08-24 MED ORDER — PHENYLEPHRINE 80 MCG/ML (10ML) SYRINGE FOR IV PUSH (FOR BLOOD PRESSURE SUPPORT)
PREFILLED_SYRINGE | INTRAVENOUS | Status: DC | PRN
  Administered 2022-08-24: 40 ug via INTRAVENOUS
  Administered 2022-08-24: 80 ug via INTRAVENOUS

## 2022-08-24 MED ORDER — CHLORHEXIDINE GLUCONATE 0.12 % MT SOLN: 15.0000 mL | Freq: Once | OROMUCOSAL | Status: AC

## 2022-08-24 MED ORDER — HEPARIN SODIUM (PORCINE) 1000 UNIT/ML DIALYSIS: 2500.0000 [IU] | Freq: Once | INTRAMUSCULAR | Status: DC

## 2022-08-24 MED ORDER — ONDANSETRON HCL 4 MG/2ML IJ SOLN
INTRAMUSCULAR | Status: DC | PRN
  Administered 2022-08-24: 4 mg via INTRAVENOUS

## 2022-08-24 MED ORDER — PENTAFLUOROPROP-TETRAFLUOROETH EX AERO: 1.0000 | INHALATION_SPRAY | CUTANEOUS | Status: DC | PRN

## 2022-08-24 MED ORDER — BACITRACIN ZINC 500 UNIT/GM EX OINT
TOPICAL_OINTMENT | CUTANEOUS | Status: DC | PRN
  Administered 2022-08-24: 1 via TOPICAL

## 2022-08-24 MED ORDER — SODIUM CHLORIDE 0.9 % IV SOLN: INTRAVENOUS | Status: DC

## 2022-08-24 MED ORDER — ACETAMINOPHEN 500 MG PO TABS
1000.0000 mg | ORAL_TABLET | Freq: Once | ORAL | Status: AC
  Administered 2022-08-24: 1000 mg via ORAL
  Filled 2022-08-24: qty 2

## 2022-08-24 MED ORDER — LIDOCAINE HCL (PF) 1 % IJ SOLN: 5.0000 mL | INTRAMUSCULAR | Status: DC | PRN

## 2022-08-24 MED ORDER — INSULIN ASPART 100 UNIT/ML IJ SOLN: 0.0000 [IU] | INTRAMUSCULAR | Status: DC | PRN

## 2022-08-24 MED ORDER — FENTANYL CITRATE (PF) 250 MCG/5ML IJ SOLN
INTRAMUSCULAR | Status: AC
  Filled 2022-08-24: qty 5

## 2022-08-24 MED ORDER — HYDROMORPHONE HCL 1 MG/ML IJ SOLN: 0.2500 mg | INTRAMUSCULAR | Status: DC | PRN

## 2022-08-24 MED ORDER — FENTANYL CITRATE (PF) 250 MCG/5ML IJ SOLN
INTRAMUSCULAR | Status: DC | PRN
  Administered 2022-08-24 (×2): 50 ug via INTRAVENOUS
  Administered 2022-08-24: 100 ug via INTRAVENOUS
  Administered 2022-08-24: 50 ug via INTRAVENOUS

## 2022-08-24 MED ORDER — BACITRACIN ZINC 500 UNIT/GM EX OINT
TOPICAL_OINTMENT | CUTANEOUS | Status: AC
  Filled 2022-08-24: qty 28.35

## 2022-08-24 MED ORDER — PHENYLEPHRINE HCL-NACL 20-0.9 MG/250ML-% IV SOLN
INTRAVENOUS | Status: DC | PRN
  Administered 2022-08-24: 40 ug/min via INTRAVENOUS

## 2022-08-24 SURGICAL SUPPLY — 57 items
BAG COUNTER SPONGE SURGICOUNT (BAG) ×1 IMPLANT
BANDAGE ESMARK 6X9 LF (GAUZE/BANDAGES/DRESSINGS) ×1 IMPLANT
BLADE SAW RECIP 87.9 MT (BLADE) ×1 IMPLANT
BNDG COHESIVE 6X5 TAN STRL LF (GAUZE/BANDAGES/DRESSINGS) ×1 IMPLANT
BNDG ELASTIC 4X5.8 VLCR STR LF (GAUZE/BANDAGES/DRESSINGS) ×2 IMPLANT
BNDG ELASTIC 6X5.8 VLCR STR LF (GAUZE/BANDAGES/DRESSINGS) IMPLANT
BNDG ESMARK 6X9 LF (GAUZE/BANDAGES/DRESSINGS) ×1
BNDG GAUZE DERMACEA FLUFF 4 (GAUZE/BANDAGES/DRESSINGS) ×1 IMPLANT
CANISTER SUCT 3000ML PPV (MISCELLANEOUS) ×1 IMPLANT
COVER SURGICAL LIGHT HANDLE (MISCELLANEOUS) ×1 IMPLANT
CUFF TOURN SGL QUICK 18X4 (TOURNIQUET CUFF) IMPLANT
CUFF TOURN SGL QUICK 24 (TOURNIQUET CUFF) ×1
CUFF TOURN SGL QUICK 34 (TOURNIQUET CUFF)
CUFF TOURN SGL QUICK 42 (TOURNIQUET CUFF) IMPLANT
CUFF TRNQT CYL 24X4X16.5-23 (TOURNIQUET CUFF) IMPLANT
CUFF TRNQT CYL 34X4.125X (TOURNIQUET CUFF) IMPLANT
DRAIN CHANNEL 19F RND (DRAIN) IMPLANT
DRAPE HALF SHEET 40X57 (DRAPES) ×1 IMPLANT
DRAPE INCISE IOBAN 66X45 STRL (DRAPES) ×1 IMPLANT
DRAPE ORTHO SPLIT 77X108 STRL (DRAPES) ×2
DRAPE SURG ORHT 6 SPLT 77X108 (DRAPES) ×2 IMPLANT
DRAPE U-SHAPE 47X51 STRL (DRAPES) ×1 IMPLANT
DRSG ADAPTIC 3X8 NADH LF (GAUZE/BANDAGES/DRESSINGS) ×1 IMPLANT
ELECT CAUTERY BLADE 6.4 (BLADE) ×1 IMPLANT
ELECT REM PT RETURN 9FT ADLT (ELECTROSURGICAL) ×1
ELECTRODE REM PT RTRN 9FT ADLT (ELECTROSURGICAL) ×1 IMPLANT
EVACUATOR SILICONE 100CC (DRAIN) IMPLANT
GAUZE SPONGE 4X4 12PLY STRL (GAUZE/BANDAGES/DRESSINGS) ×1 IMPLANT
GAUZE SPONGE 4X4 12PLY STRL LF (GAUZE/BANDAGES/DRESSINGS) IMPLANT
GLOVE BIO SURGEON STRL SZ7.5 (GLOVE) ×1 IMPLANT
GLOVE BIOGEL PI IND STRL 8 (GLOVE) ×1 IMPLANT
GLOVE SURG POLY ORTHO LF SZ7.5 (GLOVE) IMPLANT
GLOVE SURG UNDER LTX SZ8 (GLOVE) ×1 IMPLANT
GOWN STRL REUS W/ TWL LRG LVL3 (GOWN DISPOSABLE) ×3 IMPLANT
GOWN STRL REUS W/TWL LRG LVL3 (GOWN DISPOSABLE) ×3
KIT BASIN OR (CUSTOM PROCEDURE TRAY) ×1 IMPLANT
KIT TURNOVER KIT B (KITS) ×1 IMPLANT
NS IRRIG 1000ML POUR BTL (IV SOLUTION) ×1 IMPLANT
PACK GENERAL/GYN (CUSTOM PROCEDURE TRAY) ×1 IMPLANT
PAD ARMBOARD 7.5X6 YLW CONV (MISCELLANEOUS) ×2 IMPLANT
RASP HELIOCORDIAL MED (MISCELLANEOUS) IMPLANT
STAPLER VISISTAT 35W (STAPLE) ×1 IMPLANT
STOCKINETTE IMPERVIOUS LG (DRAPES) ×1 IMPLANT
SUT ETHILON 3 0 PS 1 (SUTURE) IMPLANT
SUT SILK 0 TIES 10X30 (SUTURE) ×1 IMPLANT
SUT SILK 2 0 (SUTURE) ×1
SUT SILK 2 0 SH CR/8 (SUTURE) ×1 IMPLANT
SUT SILK 2-0 18XBRD TIE 12 (SUTURE) ×1 IMPLANT
SUT SILK 3 0 (SUTURE) ×1
SUT SILK 3-0 18XBRD TIE 12 (SUTURE) ×1 IMPLANT
SUT VIC AB 2-0 CT1 18 (SUTURE) ×1 IMPLANT
SUT VIC AB 3-0 SH 27 (SUTURE) ×1
SUT VIC AB 3-0 SH 27X BRD (SUTURE) IMPLANT
TOWEL GREEN STERILE (TOWEL DISPOSABLE) ×2 IMPLANT
TOWEL GREEN STERILE FF (TOWEL DISPOSABLE) ×1 IMPLANT
UNDERPAD 30X36 HEAVY ABSORB (UNDERPADS AND DIAPERS) ×1 IMPLANT
WATER STERILE IRR 1000ML POUR (IV SOLUTION) ×1 IMPLANT

## 2022-08-24 NOTE — OR Nursing (Signed)
PATIENT HAS SUPRAPUBIC CATHETER IN PLACE DRAINING THICK GREEN FLUID

## 2022-08-24 NOTE — Anesthesia Preprocedure Evaluation (Addendum)
Anesthesia Evaluation  Patient identified by MRN, date of birth, ID band Patient awake    Reviewed: Allergy & Precautions, H&P , NPO status , Patient's Chart, lab work & pertinent test results  Airway Mallampati: III  TM Distance: >3 FB Neck ROM: Full    Dental  (+) Poor Dentition, Dental Advisory Given, Chipped, Missing, Edentulous Upper   Pulmonary asthma , sleep apnea , COPD,  COPD inhaler, former smoker 14 pack year history    Pulmonary exam normal breath sounds clear to auscultation       Cardiovascular hypertension, Pt. on medications + Peripheral Vascular Disease and +CHF  Normal cardiovascular exam Rhythm:Regular Rate:Normal     Neuro/Psych  PSYCHIATRIC DISORDERS Anxiety      Neuromuscular disease (peripheral neuropathy)    GI/Hepatic Neg liver ROS, hiatal hernia,GERD  Medicated and Controlled,,  Endo/Other  diabetes, Well Controlled, Type 2, Oral Hypoglycemic Agents    Renal/GU CRFRenal diseaseK 3.6 this AM, last HD 3d ago  negative genitourinary   Musculoskeletal  (+) Arthritis , Osteoarthritis,    Abdominal   Peds negative pediatric ROS (+)  Hematology  (+) Blood dyscrasia, anemia Hb 11, plt 199   Anesthesia Other Findings   Reproductive/Obstetrics negative OB ROS                             Anesthesia Physical Anesthesia Plan  ASA: 3  Anesthesia Plan: General   Post-op Pain Management: Tylenol PO (pre-op)*   Induction:   PONV Risk Score and Plan: 3 and Propofol infusion and TIVA  Airway Management Planned: Oral ETT  Additional Equipment: None  Intra-op Plan:   Post-operative Plan:   Informed Consent: I have reviewed the patients History and Physical, chart, labs and discussed the procedure including the risks, benefits and alternatives for the proposed anesthesia with the patient or authorized representative who has indicated his/her understanding and acceptance.      Dental advisory given  Plan Discussed with: CRNA  Anesthesia Plan Comments:         Anesthesia Quick Evaluation

## 2022-08-24 NOTE — Telephone Encounter (Signed)
Patient Advocate Encounter   Received notification that prior authorization for [facility] 200-5MCG/ACT aerosol is required.   PA submitted on 08/15/2022 Key [id number] Status is pending       Lyndel Safe, [place] Patient Advocate Specialist [place] Patient Advocate Team Direct Number: [phone]  Fax: [phone]

## 2022-08-24 NOTE — Op Note (Signed)
    NAME: [name]    MRN: [id number] DOB: 1951-03-25    DATE OF OPERATION: 08/01/2022  PREOP DIAGNOSIS:    Ischemic left leg  POSTOP DIAGNOSIS:    Same  PROCEDURE:    Left above-the-knee amputation  SURGEON: Judeth Cornfield. Scot Dock, MD  ASSIST: None  ANESTHESIA: General  EBL: Minimal  INDICATIONS:    Destiny Day is a 72 y.o. female with an ischemic left leg.  She presents for left above-the-knee amputation.  FINDINGS:   The muscle appeared adequately perfused.  TECHNIQUE:   The patient was taken to the operating room and received a general anesthetic.  The left leg was prepped and draped in the usual sterile fashion.  A tourniquet was placed on the upper thigh.  The circumference of the limb was measured above the patella.  I used these measurements to mark a fishmouth incision above the level of the patella.  The leg was exsanguinated with an Esmarch bandage.  The tourniquet was inflated to 300 mmHg.  Under tourniquet control, the incision was carried down to the skin, subcutaneous tissue, fascia and muscle to the femur which was dissected free circumferentially.  The bone was divided proximal to the level of skin division.  The femoral artery and vein were individually suture-ligated with 2-0 silk ties.  The old bypass graft was ligated with a 2-0 silk.  The tourniquet was released.  Additional hemostasis was obtained using electrocautery.  The edges of the bone were rasped.  The wound was irrigated.  Hemostasis was obtained in the wound.  Tissue was closed over the bone with a running 3-0 Vicryl.  The fascia was then closed with interrupted 2-0 Vicryl's.  The skin was closed with staples.  Sterile dressing was applied.  The patient tolerated the procedure well and was transferred to the recovery room in stable condition.  All needle and sponge counts were correct.   [name], MD, FACS [facility]  DATE OF DICTATION:    08/01/2022

## 2022-08-24 NOTE — Progress Notes (Addendum)
[facility] Progress Note   Subjective:  Seen at onset of HD - BP low side. S/p L AKA this AM. She is talking quietly, able to ask me about her HD time today.  Objective Vitals:   08/08/2022 1110 08/09/2022 1115 08/23/2022 1130 07/27/2022 1145  BP: (!) 150/54 (!) 150/50 (!) 123/46 (!) 109/48  Pulse: 75 76 75 75  Resp: '16 15 14 12  '$ Temp: 97.8 F (36.6 C)  98.6 F (37 C) 98.7 F (37.1 C)  TempSrc:      SpO2: 100% 100% 100% 100%  Weight:      Height:       Physical Exam  General: Chronically ill appearing woman, NAD. Nasal O2 in place Heart: RRR; no murmur Lungs: CTA anteriorly Abdomen: soft Extremities: L AKA bandaged, RLE without edema Dialysis Access: L AVG + bruit  Additional Objective Labs: Basic Metabolic Panel: Recent Labs  Lab 08/19/22 0853 08/20/22 0509 08/22/22 0307 07/28/2022 0855  NA 134* 135 135 135  K 2.6* 3.6 3.1* 3.4*  CL 94* 95* 94* 96*  CO2 '24 24 25  '$ --   GLUCOSE 173* 146* 178* 152*  BUN '18 10 9 '$ 29*  CREATININE 4.80* 3.28* 2.95* 6.60*  CALCIUM 10.2 10.2 10.6*  --   PHOS 4.2 4.0 3.6  --    Liver Function Tests: Recent Labs  Lab 08/19/22 0853 08/20/22 0509 08/22/22 0307  ALBUMIN 2.0* 2.2* 2.2*   CBC: Recent Labs  Lab 08/18/22 0727 08/19/22 0853 08/20/22 0509 08/22/22 0307 08/23/22 0247 08/23/2022 0527 08/05/2022 0855  WBC 21.7* 20.1* 16.9* 23.1* 18.9* 16.2*  --   NEUTROABS 17.8*  --   --   --   --   --   --   HGB 11.3* 10.1* 11.3* 12.1 11.2* 11.0* 11.6*  HCT 35.6* 32.1* 34.4* 37.5 35.1* 34.1* 34.0*  MCV 97.5 99.1 97.2 99.2 99.2 97.7  --   PLT 338 350 314 273 229 199  --    Blood Culture    Component Value Date/Time   SDES WOUND 08/03/2022 1326   SPECREQUEST  ABCESS ABDOMEN 08/03/2022 1326   CULT  08/03/2022 1326    ABUNDANT ESCHERICHIA COLI MODERATE KLEBSIELLA PNEUMONIAE ABUNDANT BACTEROIDES THETAIOTAOMICRON BETA LACTAMASE POSITIVE Performed at [facility], [place] [address], [place], [place] [address]     REPTSTATUS 08/08/2022 FINAL 08/03/2022 1326    Cardiac Enzymes: Recent Labs  Lab 08/23/22 0900  CKTOTAL 275*   CBG: Recent Labs  Lab 08/23/22 2151 08/21/2022 0738 08/17/2022 0827 08/20/2022 1110 07/28/2022 1236  GLUCAP 138* 164* 169* 118* 105*   Medications:   ceFAZolin (ANCEF) IV 2 g (08/21/22 1751)    acidophilus  1 capsule Oral Daily   amLODipine  10 mg Oral QHS   atorvastatin  40 mg Oral QHS   carvedilol  3.125 mg Oral BID WC   Chlorhexidine Gluconate Cloth  6 each Topical Q0600   doxercalciferol  7 mcg Intravenous Q M,W,F-HD   feeding supplement  237 mL Oral BID BM   ferric citrate  420 mg Oral TID WC   fluticasone furoate-vilanterol  1 puff Inhalation Daily   gabapentin  100 mg Oral QHS   Gerhardt's butt cream   Topical TID   HYDROmorphone       insulin aspart  0-6 Units Subcutaneous TID WC   isosorbide-hydrALAZINE  1 tablet Oral BID   metroNIDAZOLE  500 mg Oral Q12H   multivitamin  1 tablet Oral QHS   pantoprazole  40 mg Oral BID   ursodiol  300 mg Oral BID    Dialysis Orders: [place] MWF 4h, 400/600, EDW 57.4kg, 2K/3.5Ca bath, LUA AVG  Heparin 2500 units IV initial + 1000 units IV mid run - Hectorol 91mg IV q HD - Venofer '50mg'$  weekly - Mircera 347m q 4 wks, last 12/8  Assessment/Plan: Abdominal abscess: Gen surg and IR consulted, started on IV abx. IR placed drain in RLQ fluid collection. Fluid sent for culture - grew E coli/ Klebsiella bacteroides. 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 Repeat CT 1/24 - no new findings.  Per ID - Cefazolin 2 g IV q HD + Flagyl 500 BID until 09/17/22 PAD/critical limb ischemia w/rest pain - CTA on 1/15 w/occluded L fem-pop. VVS consulted with initial plan for angiogram + intervention. 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 ESRD: Continue HD on MWF schedule - for HD now. Low UF goal, no  heparin. Hypokalemia - intermittent with diarrhea. Supplementing prn HTN/ volume: No significant edema on exam. Continue home amlodipine, carvedilol, BiDil with holding parameters. BP lower today, low UF goal. Anemia of ESRD: Hgb 11.6, follow for post-op drop and can give ESA prn. Secondary HPTH: CorrCa high, VDRA on hold. Phos ok - continue Auryxia as binder. T2DM COPD/asthma Encephalopathy, multifactorial: Waxing and wanning. Gabapentin on hold. Nutrition: Alb low, continue protein supplements. Currently on regular diet.    KaVeneta PentonPA-C 08/15/2022, 2:45 PM  [facility];  Agree with Above, s/p left AKA today. BP soft, lower UF goal.   D. Atom Solivan,  CKA

## 2022-08-24 NOTE — Telephone Encounter (Signed)
Patient Advocate Encounter  Received notification that the request for prior authorization for Dulera 200-5MCG/ACT aerosol has been denied due to must use AirDUo Respiclick first.     Lyndel Safe, CPhT Pharmacy Patient Advocate Specialist [place] Patient Advocate Team Direct Number: [phone]  Fax: [phone]

## 2022-08-24 NOTE — Anesthesia Postprocedure Evaluation (Signed)
Anesthesia Post Note  Patient: Destiny Day  Procedure(s) Performed: LEFT AMPUTATION ABOVE KNEE (Left: Knee)     Patient location during evaluation: PACU Anesthesia Type: General Level of consciousness: awake and alert, oriented and patient cooperative Pain management: pain level controlled Vital Signs Assessment: post-procedure vital signs reviewed and stable Respiratory status: spontaneous breathing, nonlabored ventilation and respiratory function stable Cardiovascular status: blood pressure returned to baseline and stable Postop Assessment: no apparent nausea or vomiting Anesthetic complications: no   No notable events documented.  Last Vitals:  Vitals:   08/04/2022 1130 08/10/2022 1145  BP: (!) 123/46 (!) 109/48  Pulse: 75 75  Resp: 14 12  Temp: 37 C 37.1 C  SpO2: 100% 100%    Last Pain:  Vitals:   07/29/2022 1115  TempSrc:   PainSc: [place]

## 2022-08-24 NOTE — TOC Progression Note (Signed)
Transition of Care [facility]) - Progression Note    Patient Details  Name: DELLA SCRIVENER MRN: [id number] Date of Birth: 1950/10/06  Transition of Care [facility]) CM/SW [place], LCSW Phone Number: 08/05/2022, 5:34 PM  Clinical Narrative:    CSW spoke with patient's spouse about updated DC plan. He is requesting CIR. CSW will follow for updated therapy notes and request CIR eval. If they cannot accept patient, spouse in agreement with SNF placement. He is hoping for [facility] or [facility]. He stated if dialysis transport is a barrier, he will commit to taking patient back and forth. He requested a small wheelchair for patient and CSW explained that will be completed upon discharge from SNF. CSW continuing to follow.    Expected Discharge Plan: [place] Barriers to Discharge: Continued Medical Work up, SNF Pending bed offer (Dialysis accepting snf)  Expected Discharge Plan and Services In-house Referral: Clinical Social Work   Post Acute Care Choice: [place], IP Rehab Living arrangements for the past 2 months: Single Family Home                                       Social Determinants of Health (SDOH) Interventions [place]: No Food Insecurity (08/04/2022)  Housing: Low Risk  (08/04/2022)  Transportation Needs: No Transportation Needs (08/04/2022)  Utilities: Not At Risk (08/04/2022)  Depression (PHQ2-9): Low Risk  (05/30/2020)  Tobacco Use: Medium Risk (08/20/2022)    Readmission Risk Interventions    08/21/2022    5:33 PM 08/07/2022    4:27 PM 08/04/2021    5:41 PM  Readmission Risk Prevention Plan  Transportation Screening  Complete Complete  Medication Review Press photographer)  Complete Complete  PCP or Specialist appointment within 3-5 days of discharge  Complete Complete  HRI or Home Care Consult  Complete Complete  SW Recovery Care/Counseling Consult  Complete Complete  Palliative Care  Screening  Not Applicable Not [place] Complete Not Applicable Not Applicable

## 2022-08-24 NOTE — Interval H&P Note (Signed)
History and Physical Interval Note:  08/04/2022 9:00 AM  Destiny Day  has presented today for surgery, with the diagnosis of PAD.  The various methods of treatment have been discussed with the patient and family. After consideration of risks, benefits and other options for treatment, the patient has consented to  Procedure(s): AMPUTATION ABOVE KNEE (Left) as a surgical intervention.  The patient's history has been reviewed, patient examined, no change in status, stable for surgery.  I have reviewed the patient's chart and labs.  Questions were answered to the patient's satisfaction.     [name]

## 2022-08-24 NOTE — Progress Notes (Signed)
Received patient in bed to unit.  Alert and oriented.  Informed consent signed and in chart.   Tx duration: 4h  Patient tolerated well.  Transported back to the room  Alert, without acute distress.  Hand-off given to patient's nurse.   Access used: L UAF Access issues: None  Total UF removed: 1095m Medication(s) given: Hectorol 737m Post HD weight: 48.8kg   07/30/2022 1907  Vitals  Temp 98.1 F (36.7 C)  Temp Source Oral  BP (!) 117/48  MAP (mmHg) 69  Pulse Rate (!) 51  ECG Heart Rate 86  Resp (!) 26  Oxygen Therapy  SpO2 100 %  O2 Device Nasal Cannula  O2 Flow Rate (L/min) 3 L/min  During Treatment Monitoring  Intra-Hemodialysis Comments Tolerated well;Tx completed  Post Treatment  Dialyzer Clearance Lightly streaked  Duration of HD Treatment -hour(s) 4 hour(s)  Liters Processed 84.2  Fluid Removed (mL) 1000 mL  Tolerated HD Treatment Yes  AVG/AVF Arterial Site Held (minutes) 8 minutes  AVG/AVF Venous Site Held (minutes) 8 minutes  Fistula / Graft Left Upper arm Arteriovenous vein graft  Placement Date/Time: (c) 09/05/20 (c) 1259   Placed prior to admission: Yes  Orientation: Left  Access Location: Upper arm  Access Type: Arteriovenous vein graft  Site Condition No complications  Fistula / Graft Assessment Present;Thrill;Bruit  Status Deaccessed

## 2022-08-24 NOTE — Anesthesia Procedure Notes (Signed)
Procedure Name: Intubation Date/Time: 08/25/2022 9:56 AM  Performed by: Lind Guest, CRNAPre-anesthesia Checklist: Patient identified, Emergency Drugs available, Suction available, Patient being monitored and Timeout performed Patient Re-evaluated:Patient Re-evaluated prior to induction Oxygen Delivery Method: Circle system utilized Preoxygenation: Pre-oxygenation with 100% oxygen Induction Type: IV induction Ventilation: Mask ventilation without difficulty Laryngoscope Size: Mac and 3 Grade View: Grade III Tube type: Oral Tube size: 7.0 mm Number of attempts: 1 Placement Confirmation: ETT inserted through vocal cords under direct vision, positive ETCO2 and breath sounds checked- equal and bilateral Secured at: 24 cm Tube secured with: Tape Dental Injury: Teeth and Oropharynx as per pre-operative assessment

## 2022-08-24 NOTE — Progress Notes (Signed)
PROGRESS NOTE        PATIENT DETAILS Name: Destiny Day Age: 72 y.o. Sex: female Date of Birth: 02/19/1951 Admit Date: 08/17/2022 Admitting Physician Lorrin Goodell, MD [id number], Rachel Moulds, NP  Brief Summary: Patient is a 72 y.o.  female with history of ESRD on HD MWF, chronic HFpEF, PAD-s/p bilateral TMA who presented with RLQ pain-found to have perforated appendicitis with abscess.  Hospital course complicated by worsening left lower extremity rest pain due to critical limb ischemia.  See below for further details.  Significant events: 1/07>> admitted TRH-RLQ pain-subsequently found to have perforated appendicitis with abscess.  Significant studies: 1/07>> RUQ ultrasound: Cholelithiasis with positive sonographic Murphy sign. 1/07>> CT abdomen/pelvis: Findings suspicious for perforated appendicitis with periappendiceal abscess 1/11>> CT abdomen/pelvis: Interval decrease in size of the right pelvic abscess-s/p percutaneous drainage. 1/15>> HIDA scan: No evidence of acute cholecystitis. 1/15>> CT angio abdominal aorta: Severe atherosclerotic disease throughout abdomen/pelvis and bilateral lower extremities.  Severe left outflow disease with occlusion of left femoral-popliteal bypass graft, occlusion of native left SFA.  Complex collection in the right hemipelvis-enlarged since 1/11 study. 1/22>> CT head:No acute abnormality 1/24>> CT abdomen/pelvis: Drainage catheter in place-in the region of complex abnormality in the right pelvic sidewall, residual component 4.9 x 5.3 x 4.7 cm.  No clinical worsening.  Significant microbiology data: 1/08>> appendix abscess culture: E. coli/Klebsiella  Procedures: 1/08>> CT-guided drainage of right pelvic abscess 1/17>> drain check by IR  Consults: IR ID Vascular surgery General surgery Nephrology Pall Care  Subjective:  Patient in bed, appears comfortable, denies any headache, no fever, no chest pain or  pressure, no shortness of breath , no abdominal pain. No new focal weakness. Improved left leg pain.   Objective: Vitals: Blood pressure (!) 175/68, pulse 87, temperature 98.1 F (36.7 C), temperature source Oral, resp. rate 18, height '5\' 5"'$  (1.651 m), weight 54.9 kg, SpO2 93 %.   Exam:  Awake Alert, No new F.N deficits, Normal affect [place].AT,PERRAL Supple Neck, No JVD,   Symmetrical Chest wall movement, Good air movement bilaterally, CTAB RRR,No Gallops, Rubs or new Murmurs,  +ve B.Sounds, right lower quadrant abdominal drain, bilateral TMA stumps, left leg mildly cold as compared to the right.      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   Leukocytosis now slowly downtrending Repeat CT-with some residual collection but no new findings. ID switched antibiotics-current recommendations are for cefazolin (given with HD) and Flagyl-end date 09/17/22.  Needs weekly CBC/CMP/CRP.  ID follow-up arranged for 2/20. General surgery continues to recommend supportive care.    Critical left limb ischemia with rest pain, PAD-s/p left femoral-popliteal bypass graft, CT angio with occluded left femoral to below-knee popliteal artery bypass Remains on IV heparin Seen by vascular surgery, case discussed with vascular surgeon Dr.Cain on 08/23/2022 patient will likely not benefit from revascularization and will benefit from a left AKA for which she is agreeable, likely surgery on 08/23/2022, patient has multiple advanced medical problems with poor quality of life and poor long-term prognosis, will involve palliative care for goals of care as well, patient agreeable for blood transfusion if needed during the surgery, patient will be a moderate to high risk candidate  for adverse cardiopulmonary outcome during  the perioperative period, explained this clearly to patient and husband on 08/25/2022.  Both accept the risk and want to proceed with surgery.   ESRD on HD MWF - Nephrology following and directing care  Hypokalemia - Repleted.  Acute metabolic encephalopathy Hospital delirium In the setting of acute illness/appendix abscess Overall better but with some intermittent delirium. CT head done on 1/22 at family request-no acute abnormality seen. Maintain delirium precautions  HFrEF Euvolemic Volume removal with HD  HLD Continue statin  HTN Pressure running low will adjust blood pressure medications on 08/22/2022.  Neuropathic pain Rest  pain associated PAD Neurontin  Anxiety Continue with BuSpar as needed  Asthma COPD overlap No wheezing Continue bronchodilators  Complex right ovarian mass Outpatient GYN follow-up-patient/spouse aware of this recommendation-potential malignancy  Left upper pole renal lesion Concern for malignancy-needs outpatient follow-up with urology for further workup  Debility/deconditioning PT/OT eval Home health recommended  DM-2 (A1c 6.2 on 1/8) CBGs stable with SSI  Recent Labs    08/23/22 2151 08/01/2022 0738 08/19/2022 0827  GLUCAP 138* 164* 169*    Nutrition Status: Nutrition Problem: Moderate Malnutrition Etiology: chronic illness Signs/Symptoms: severe muscle depletion, moderate fat depletion Interventions: Nepro shake, MVI, Liberalize Diet  BMI: Estimated body mass index is 20.14 kg/m as calculated from the following:   Height as of this encounter: '5\' 5"'$  (1.651 m).   Weight as of this encounter: 54.9 kg.   Code status:   Code Status: Full Code   DVT Prophylaxis: SCDs Start: 08/10/2022 2213 IV heparin   Family Communication: Spouse [name]-[phone] updated 08/24/25  Disposition Plan: Status is: Inpatient Remains inpatient appropriate because: Severity of illness.   Planned Discharge Destination:Home  health   Diet: Diet Order             Diet NPO time specified Except for: Sips with Meds  Diet effective midnight                    MEDICATIONS: Scheduled Meds:  acetaminophen  1,000 mg Oral Once   [MAR Hold] acidophilus  1 capsule Oral Daily   [MAR Hold] amLODipine  10 mg Oral QHS   [MAR Hold] atorvastatin  40 mg Oral QHS   [MAR Hold] carvedilol  3.125 mg Oral BID WC   chlorhexidine       [MAR Hold] Chlorhexidine Gluconate Cloth  6 each Topical Q0600   [MAR Hold] doxercalciferol  7 mcg Intravenous Q M,W,F-HD   [MAR Hold] feeding supplement  237 mL Oral BID BM   [MAR Hold] ferric citrate  420 mg Oral TID WC   [MAR Hold] fluticasone furoate-vilanterol  1 puff Inhalation Daily   [MAR Hold] gabapentin  100 mg Oral QHS   [MAR Hold] Gerhardt's butt cream   Topical TID   [MAR Hold] insulin aspart  0-6 Units Subcutaneous TID WC   [MAR Hold] isosorbide-hydrALAZINE  1 tablet Oral BID   [MAR Hold] metroNIDAZOLE  500 mg Oral Q12H   [MAR Hold] multivitamin  1 tablet Oral QHS   [MAR Hold] pantoprazole  40 mg Oral BID   [MAR Hold] ursodiol  300 mg Oral BID   Continuous Infusions:  [MAR Hold]  ceFAZolin (ANCEF) IV 2 g (08/21/22 1751)   PRN Meds:.[MAR Hold] albuterol, [MAR Hold] busPIRone, chlorhexidine, [MAR Hold] fluticasone, [MAR Hold] hydrALAZINE, [MAR Hold] ondansetron (ZOFRAN) IV, [MAR Hold] ondansetron, [MAR Hold] oxyCODONE, [MAR Hold] prochlorperazine, [MAR Hold] traZODone   I have personally reviewed following labs  and imaging studies  LABORATORY DATA:  Recent Labs  Lab 08/18/22 0727 08/19/22 0853 08/20/22 0509 08/22/22 0307 08/23/22 0247 08/10/2022 0527  WBC 21.7* 20.1* 16.9* 23.1* 18.9* 16.2*  HGB 11.3* 10.1* 11.3* 12.1 11.2* 11.0*  HCT 35.6* 32.1* 34.4* 37.5 35.1* 34.1*  PLT 338 350 314 273 229 199  MCV 97.5 99.1 97.2 99.2 99.2 97.7  MCH 31.0 31.2 31.9 32.0 31.6 31.5  MCHC 31.7 31.5 32.8 32.3 31.9 32.3  RDW 14.4 14.6 14.7 15.1 15.1 15.4  LYMPHSABS 1.8  --    --   --   --   --   MONOABS 1.3*  --   --   --   --   --   EOSABS 0.1  --   --   --   --   --   BASOSABS 0.1  --   --   --   --   --     Recent Labs  Lab 08/19/22 0853 08/20/22 0509 08/22/22 0307  NA 134* 135 135  K 2.6* 3.6 3.1*  CL 94* 95* 94*  CO2 '24 24 25  '$ ANIONGAP 16* 16* 16*  GLUCOSE 173* 146* 178*  BUN '18 10 9  '$ CREATININE 4.80* 3.28* 2.95*  ALBUMIN 2.0* 2.2* 2.2*  CALCIUM 10.2 10.2 10.6*     LOS: 22 days   Signature  -    Lala Lund M.D on 07/27/2022 at 8:53 AM   -  To page go to [email or web address]

## 2022-08-24 NOTE — Transfer of Care (Signed)
Immediate Anesthesia Transfer of Care Note  Patient: Destiny Day  Procedure(s) Performed: LEFT AMPUTATION ABOVE KNEE (Left: Knee)  Patient Location: PACU  Anesthesia Type:General  Level of Consciousness: drowsy and patient cooperative  Airway & Oxygen Therapy: Patient Spontanous Breathing and Patient connected to face mask oxygen  Post-op Assessment: Report given to RN and Post -op Vital signs reviewed and stable  Post vital signs: Reviewed and stable  Last Vitals:  Vitals Value Taken Time  BP 150/54 08/18/2022 1109  Temp    Pulse 76 08/22/2022 1112  Resp 15 08/18/2022 1112  SpO2 100 % 08/18/2022 1112  Vitals shown include unvalidated device data.  Last Pain:  Vitals:   07/29/2022 0923  TempSrc:   PainSc: 8       Patients Stated Pain Goal: 2 (42/10/31 2811)  Complications: No notable events documented.

## 2022-08-24 NOTE — TOC Benefit Eligibility Note (Addendum)
Patient Teacher, English as a foreign language completed.    The patient is currently admitted and upon discharge could be taking Breo Ellipta 200-25 mcg/act.  The current 30 day co-pay is $383.09 due to a 412.99 deductible remaining.   The patient is currently admitted and upon discharge could be taking Fluticasone - Salmeterol (AuroDuo Respiclick) 914-44 mcg/act.  The current 30 day co-pay is $110.76 due to a 412.99 deductible remaining.   The patient is currently admitted and upon discharge could be taking Dulera 200-5 mcg/act.  Requires Prior Authorization  The patient is currently admitted and upon discharge could be taking Advair 500-50 mcg/act.  Non Formulary  The patient is insured through [place], [place] Patient Advocate Specialist [place] Patient Advocate Team Direct Number: [phone]  Fax: [phone]

## 2022-08-25 ENCOUNTER — Encounter (HOSPITAL_COMMUNITY): Payer: Self-pay | Admitting: Vascular Surgery

## 2022-08-25 DIAGNOSIS — R109 Unspecified abdominal pain: Secondary | ICD-10-CM | POA: Diagnosis not present

## 2022-08-25 DIAGNOSIS — K659 Peritonitis, unspecified: Secondary | ICD-10-CM | POA: Diagnosis not present

## 2022-08-25 DIAGNOSIS — Z89619 Acquired absence of unspecified leg above knee: Secondary | ICD-10-CM

## 2022-08-25 DIAGNOSIS — Z7189 Other specified counseling: Secondary | ICD-10-CM | POA: Diagnosis not present

## 2022-08-25 DIAGNOSIS — Z515 Encounter for palliative care: Secondary | ICD-10-CM

## 2022-08-25 DIAGNOSIS — I70223 Atherosclerosis of native arteries of extremities with rest pain, bilateral legs: Secondary | ICD-10-CM

## 2022-08-25 LAB — CBC
HCT: 38.5 % (ref 36.0–46.0)
Hemoglobin: 11.9 g/dL — ABNORMAL LOW (ref 12.0–15.0)
MCH: 31 pg (ref 26.0–34.0)
MCHC: 30.9 g/dL (ref 30.0–36.0)
MCV: 100.3 fL — ABNORMAL HIGH (ref 80.0–100.0)
Platelets: 193 10*3/uL (ref 150–400)
RBC: 3.84 MIL/uL — ABNORMAL LOW (ref 3.87–5.11)
RDW: 15.4 % (ref 11.5–15.5)
WBC: 15.4 10*3/uL — ABNORMAL HIGH (ref 4.0–10.5)
nRBC: 0.2 % (ref 0.0–0.2)

## 2022-08-25 LAB — GLUCOSE, CAPILLARY
Glucose-Capillary: 130 mg/dL — ABNORMAL HIGH (ref 70–99)
Glucose-Capillary: 166 mg/dL — ABNORMAL HIGH (ref 70–99)
Glucose-Capillary: 155 mg/dL — ABNORMAL HIGH (ref 70–99)
Glucose-Capillary: 109 mg/dL — ABNORMAL HIGH (ref 70–99)

## 2022-08-25 MED ORDER — NEPRO/CARBSTEADY PO LIQD
237.0000 mL | Freq: Two times a day (BID) | ORAL | Status: DC
  Administered 2022-08-25 – 2022-08-26 (×3): 237 mL via ORAL

## 2022-08-25 MED ORDER — PROSOURCE PLUS PO LIQD
30.0000 mL | Freq: Two times a day (BID) | ORAL | Status: DC
  Administered 2022-08-25 – 2022-08-26 (×2): 30 mL via ORAL
  Filled 2022-08-25 (×3): qty 30

## 2022-08-25 MED ORDER — ISOSORB DINITRATE-HYDRALAZINE 20-37.5 MG PO TABS
1.0000 | ORAL_TABLET | Freq: Two times a day (BID) | ORAL | Status: DC
  Administered 2022-08-26: 1 via ORAL
  Filled 2022-08-25 (×2): qty 1

## 2022-08-25 MED ORDER — MIDODRINE HCL 5 MG PO TABS
10.0000 mg | ORAL_TABLET | Freq: Three times a day (TID) | ORAL | Status: DC
  Administered 2022-08-25 – 2022-08-26 (×4): 10 mg via ORAL
  Filled 2022-08-25 (×5): qty 2

## 2022-08-25 NOTE — TOC Progression Note (Signed)
Transition of Care [facility]) - Progression Note    Patient Details  Name: Destiny Day MRN: [id number] Date of Birth: [birth year]  [facility]) CM/SW [place], LCSW Phone Number: 08/25/2022, 4:15 PM  Clinical Narrative:    CSW touched base with therapy and CIR and patient is not currently at a level to tolerate their program. CSW notified spouse and provided SNF bed offers. He has selected [facility] and is aware patient will need to switch dialysis clinics while there. CSW enlisting the help of renal navigator to check available spots. [facility] transports to [place], [place], and [facility]. Spouse with preference for [facility]. Spouse shared he does not feel patient will be ready medically anytime soon.    Expected Discharge Plan: [place] Barriers to Discharge: Continued Medical Work up, SNF Pending bed offer (Dialysis accepting snf)  Expected Discharge Plan and Services In-house Referral: Clinical Social Work   Post Acute Care Choice: [place], IP Rehab Living arrangements for the past 2 months: Single Family Home                                       Social Determinants of Health (SDOH) Interventions [place]: No Food Insecurity (08/04/2022)  Housing: Low Risk  (08/04/2022)  Transportation Needs: No Transportation Needs (08/04/2022)  Utilities: Not At Risk (08/04/2022)  Depression (PHQ2-9): Low Risk  (05/30/2020)  Tobacco Use: Medium Risk (08/25/2022)    Readmission Risk Interventions    08/07/2022    5:33 PM 08/07/2022    4:27 PM 08/04/2021    5:41 PM  Readmission Risk Prevention Plan  Transportation Screening  Complete Complete  Medication Review Press photographer)  Complete Complete  PCP or Specialist appointment within 3-5 days of discharge  Complete Complete  HRI or Home Care Consult  Complete Complete  SW Recovery Care/Counseling Consult  Complete Complete  Palliative Care Screening   Not Applicable Not [place] Complete Not Applicable Not Applicable

## 2022-08-25 NOTE — Progress Notes (Addendum)
Physical Therapy Note  (Full note to follow)  Serial BPs taken during PT session;     08/25/22 1030 08/25/22 1038 08/25/22 1040  Vital Signs  Patient Position (if appropriate) Orthostatic Vitals  --   --   Orthostatic Lying   BP- Lying  --   --   --   Orthostatic Sitting  BP- Sitting 156/62 (sitting EOB) (!) 110/34 (post tsfr to recliner; sitting upright) (!) 89/44 (reclined, footrest up in recliner)    08/25/22 1045 08/25/22 1048  Vital Signs  Patient Position (if appropriate)  --   --   Orthostatic Lying   BP- Lying 103/46 (immediately post drwsheet back to bed) (!) 100/39 (HOB lowered (did not tolerate near flat position))  Orthostatic Sitting  BP- Sitting  --   --    Vitals at end of session: 102/53, MAP 65;     HR 95    O2 sats 96% on supplemental O2    Noteworthy BP drop (especially DBP) with transfer OOB to recliner, and with incr time sitting upright; RN notified;  Ended session with pt back in bed, HOB slightly elevated, in L semi-sidelying;  See other PT note of this date for other details;   Roney Marion, [place] Office [phone]

## 2022-08-25 NOTE — Progress Notes (Signed)
PROGRESS NOTE        PATIENT DETAILS Name: Destiny Day Age: 72 y.o. Sex: female Date of Birth: 1951-05-22 Admit Date: 08/10/2022 Admitting Physician Lorrin Goodell, MD QQV:ZDGLO, Rachel Moulds, NP  Brief Summary: Patient is a 72 y.o.  female with history of ESRD on HD MWF, chronic HFpEF, PAD-s/p bilateral TMA who presented with RLQ pain-found to have perforated appendicitis with abscess.  Hospital course complicated by worsening left lower extremity rest pain due to critical limb ischemia.  See below for further details.  Significant events: 1/07>> admitted TRH-RLQ pain-subsequently found to have perforated appendicitis with abscess.  Significant studies: 1/07>> RUQ ultrasound: Cholelithiasis with positive sonographic Murphy sign. 1/07>> CT abdomen/pelvis: Findings suspicious for perforated appendicitis with periappendiceal abscess 1/11>> CT abdomen/pelvis: Interval decrease in size of the right pelvic abscess-s/p percutaneous drainage. 1/15>> HIDA scan: No evidence of acute cholecystitis. 1/15>> CT angio abdominal aorta: Severe atherosclerotic disease throughout abdomen/pelvis and bilateral lower extremities.  Severe left outflow disease with occlusion of left femoral-popliteal bypass graft, occlusion of native left SFA.  Complex collection in the right hemipelvis-enlarged since 1/11 study. 1/22>> CT head:No acute abnormality 1/24>> CT abdomen/pelvis: Drainage catheter in place-in the region of complex abnormality in the right pelvic sidewall, residual component 4.9 x 5.3 x 4.7 cm.  No clinical worsening.  Significant microbiology data: 1/08>> appendix abscess culture: E. 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 Donzetta Matters vascular surgery  Consults: IR ID Vascular surgery General surgery Nephrology Pall Care  Subjective: Patient in bed appears to be in no  distress denies any headache chest or abdominal pain, left lower extremity discomfort improved after AKA   Objective: Vitals: Blood pressure (!) 152/62, pulse 90, temperature 98.1 F (36.7 C), temperature source Axillary, resp. rate 14, height '5\' 5"'$  (1.651 m), weight 49.8 kg, SpO2 100 %.   Exam:  Awake Alert, No new F.N deficits, Normal affect [place].AT,PERRAL Supple Neck, No JVD,   Symmetrical Chest wall movement, Good air movement bilaterally, CTAB RRR,No Gallops, Rubs or new Murmurs,  +ve B.Sounds, right lower quadrant abdominal drain, right TMA stump, left AKA stump under bandage      Assessment/Plan:  Perforated appendicitis with periappendiceal abscess-s/p CT-guided drain placed on 1/8 by IR, 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   Leukocytosis now slowly downtrending Repeat CT-with some residual collection but no new findings. ID switched antibiotics-current recommendations are for cefazolin (given with HD) and Flagyl-end date 09/17/22.  Needs weekly CBC/CMP/CRP.  ID follow-up arranged for 2/20. General surgery continues to recommend supportive care.    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  Palliative care has also been consulted for goals of care discussion.   ESRD on HD MWF - Nephrology following and directing care  Hypokalemia - Repleted.  Acute metabolic encephalopathy Hospital delirium In the setting of acute illness/appendix abscess Overall better but with some intermittent delirium. CT head done  on 1/22  at family request-no acute abnormality seen. Maintain delirium precautions  HFrEF Euvolemic Volume removal with HD  HLD Continue statin  HTN Pressure running low will adjust blood pressure medications on 08/22/2022.  Neuropathic pain Rest  pain associated PAD Neurontin  Anxiety Continue with BuSpar as needed  Asthma COPD overlap No wheezing Continue bronchodilators  Complex right ovarian mass Outpatient GYN follow-up-patient/spouse aware of this recommendation-potential malignancy  Left upper pole renal lesion Concern for malignancy-needs outpatient follow-up with urology for further workup  Debility/deconditioning PT/OT eval Home health recommended  DM-2 (A1c 6.2 on 1/8) CBGs stable with SSI  Recent Labs    08/26/2022 1236 08/23/2022 1956 08/25/22 0747  GLUCAP 105* 103* 130*    Nutrition Status: Nutrition Problem: Moderate Malnutrition Etiology: chronic illness Signs/Symptoms: severe muscle depletion, moderate fat depletion Interventions: Nepro shake, MVI, Liberalize Diet  BMI: Estimated body mass index is 18.27 kg/m as calculated from the following:   Height as of this encounter: '5\' 5"'$  (1.651 m).   Weight as of this encounter: 49.8 kg.   Code status:   Code Status: Full Code   DVT Prophylaxis: SCDs Start: 08/26/2022 2213 IV heparin   Family Communication: Spouse [name]-[phone] updated 08/24/25  Disposition Plan: Status is: Inpatient Remains inpatient appropriate because: Severity of illness.   Planned Discharge Destination:Home health   Diet: Diet Order             DIET - DYS 1 Room service appropriate? Yes; Fluid consistency: Thin  Diet effective now                    MEDICATIONS: Scheduled Meds:  acidophilus  1 capsule Oral Daily   amLODipine  10 mg Oral QHS   atorvastatin  40 mg Oral QHS   carvedilol  3.125 mg Oral BID WC   Chlorhexidine Gluconate Cloth  6 each Topical Q0600   doxercalciferol  7 mcg Intravenous Q  M,W,F-HD   feeding supplement  237 mL Oral BID BM   feeding supplement (NEPRO CARB STEADY)  237 mL Oral BID BM   ferric citrate  420 mg Oral TID WC   fluticasone furoate-vilanterol  1 puff Inhalation Daily   gabapentin  100 mg Oral QHS   Gerhardt's butt cream   Topical TID   insulin aspart  0-6 Units Subcutaneous TID WC   isosorbide-hydrALAZINE  1 tablet Oral BID   metroNIDAZOLE  500 mg Oral Q12H   multivitamin  1 tablet Oral QHS   pantoprazole  40 mg Oral BID   ursodiol  300 mg Oral BID   Continuous Infusions:   ceFAZolin (ANCEF) IV 2 g (07/29/2022 2055)   PRN Meds:.albuterol, busPIRone, fluticasone, hydrALAZINE, ondansetron (ZOFRAN) IV, ondansetron, oxyCODONE, prochlorperazine, traZODone   I have personally reviewed following labs and imaging studies  LABORATORY DATA:  Recent Labs  Lab 08/22/22 0307 08/23/22 0247 08/07/2022 0527 08/08/2022 0720 08/26/2022 0855 08/25/22 0609  WBC 23.1* 18.9* 16.2* 13.9*  --  15.4*  HGB 12.1 11.2* 11.0* 11.5* 11.6* 11.9*  HCT 37.5 35.1* 34.1* 35.5* 34.0* 38.5  PLT 273 229 199 191  --  193  MCV 99.2 99.2 97.7 98.9  --  100.3*  MCH 32.0 31.6 31.5 32.0  --  31.0  MCHC 32.3 31.9 32.3 32.4  --  30.9  RDW 15.1 15.1 15.4 15.4  --  15.4    Recent Labs  Lab 08/19/22 0853 08/20/22 0509 08/22/22 0307 08/08/2022 0720 07/31/2022 0855  NA 134* 135 135 135  135  K 2.6* 3.6 3.1* 3.8 3.4*  CL 94* 95* 94* 95* 96*  CO2 '24 24 25 '$ 20*  --   ANIONGAP 16* 16* 16* 20*  --   GLUCOSE 173* 146* 178* 146* 152*  BUN '18 10 9 '$ 29* 29*  CREATININE 4.80* 3.28* 2.95* 6.28* 6.60*  ALBUMIN 2.0* 2.2* 2.2* 2.1*  --   CALCIUM 10.2 10.2 10.6* 10.9*  --      LOS: 23 days   Signature  -    Lala Lund M.D on 08/25/2022 at 10:03 AM   -  To page go to [email or web address]

## 2022-08-25 NOTE — NC FL2 (Signed)
[place] MEDICAID FL2 LEVEL OF CARE FORM     IDENTIFICATION  Patient Name: Destiny Day Birthdate: [birth year] Sex: female Admission Date (Current Location): 07/30/2022  [facility] and [place] Number:  Herbalist and Address:  [facility], [place] [address] [place], [place] [address]      Provider Number: [id number]  Attending Physician Name and Address:  Thurnell Lose, MD  Relative Name and Phone Number:       Current Level of Care: Hospital Recommended Level of Care: [place] Prior Approval Number:    Date Approved/Denied:   PASRR Number: [id number]  Discharge Plan: SNF    Current Diagnoses: Patient Active Problem List   Diagnosis Date Noted   Malnutrition of moderate degree 08/14/2022   Intra-abdominal abscess ([place]) 08/09/2022   Painful and cold lower extremity 08/09/2022   Abdominal infection ([place]) 08/18/2022   Primary open angle glaucoma of left eye, mild stage 10/23/2021   ESRD (end stage renal disease) ([place]) 08/04/2021   Fluid overload 08/03/2021   Prolonged QT interval 08/03/2021   Acute on chronic heart failure ([place]) 01/05/2021   Macular pucker, right eye 10/29/2020   Nausea with vomiting, unspecified 09/10/2020   Pseudophakia of left eye 07/02/2020   Diabetic macular edema of right eye with proliferative retinopathy associated with type 2 diabetes mellitus ([place]) 07/02/2020   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 type 2 diabetes mellitus ([place]) 04/23/2020   Allergy, unspecified, initial encounter 04/04/2020   Anaphylactic shock, unspecified, initial encounter 04/04/2020   Secondary hypertension, unspecified 12/13/2019   Other disorders of phosphorus metabolism 08/24/2019   Paroxysmal atrial fibrillation ([place]) 06/13/2019   Hypocalcemia 04/17/2019   Old bucket handle tear of lateral meniscus of right knee    Old peripheral tear of medial meniscus of right knee     Staphylococcal arthritis, unspecified joint ([place]) 05/11/2018   Septic arthritis of knee, right ([place]) 05/06/2018   Degenerative tear of posterior horn of medial meniscus, right    Old complex tear of lateral meniscus of right knee    Loose body in knee, right    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[place]) 02/03/2018   End-stage renal disease on hemodialysis ([place])    Respiratory failure ([place]) 01/21/2018   Achilles tendon contracture, right 11/09/2017   Labile blood glucose    Anemia of infection and chronic disease    Black stool    Right sided weakness    Subdural hemorrhage following injury ([place]) 08/30/2017   Diabetic peripheral neuropathy ([place])    Chronic obstructive pulmonary disease ([place])    Subdural hematoma ([place]) 08/24/2017   Traumatic subdural hematoma ([place]) 08/23/2017   Fall    SDH (subdural hematoma) ([place])    Acute respiratory failure with hypoxia ([place])    Diabetes mellitus type 2 in nonobese ([place])    Leukocytosis    Labile blood pressure    Generalized anxiety disorder    Debility 08/16/2017   Amputated great toe of right foot ([place])    Amputated toe of left foot ([place])    Post-operative pain    ESRD on dialysis [facility])    Anxiety state    Acute blood loss anemia    Diarrhea, unspecified 01/16/2017   Dependence on renal dialysis ([place]) 01/06/2017   Idiopathic chronic venous hypertension of both lower extremities with inflammation 10/13/2016   S/P transmetatarsal amputation of foot, left ([place]) 09/21/2016   Onychomycosis 08/15/2016  Unspecified protein-calorie malnutrition ([place]) 07/29/2016   Other mechanical complication of femoral arterial graft (bypass), initial encounter ([place]) 07/14/2016   Other pneumonia, unspecified organism 07/11/2016   Age-related osteoporosis without current pathological fracture 07/11/2016   Cervical disc disorder, unspecified, unspecified cervical region 07/11/2016   Diverticulitis of intestine, part unspecified, without  perforation or abscess without bleeding 07/11/2016   Unspecified abdominal hernia without obstruction or gangrene 07/11/2016   Gastro-esophageal reflux disease with esophagitis 07/11/2016   Primary generalized (osteo)arthritis 07/11/2016   Peripheral vascular disease ([place]) 07/08/2016   Cellulitis of left toe 06/29/2016   Atherosclerosis of native artery of left lower extremity with gangrene ([place]) 16/01/3709   Complication of vascular dialysis catheter 02/29/2016   Encounter for screening for other viral diseases 01/10/2016   Encounter for removal of sutures 10/29/2015   Other fluid overload 10/23/2015   Anemia in chronic kidney disease 10/16/2015   Essential (primary) hypertension 10/16/2015   Acute pulmonary edema ([place]) 10/16/2015   Coagulation defect, unspecified ([place]) 10/16/2015   Acute respiratory failure ([place]) 10/16/2015   Hyperlipidemia, unspecified 10/16/2015   Iron deficiency anemia, unspecified 10/16/2015   Pain, unspecified 10/16/2015   Pruritus, unspecified 10/16/2015   Secondary hyperparathyroidism of renal origin ([place]) 10/16/2015   Shortness of breath 10/16/2015   GERD (gastroesophageal reflux disease) 10/07/2015   ARF (acute renal failure) ([place])    Hypothermia 06/12/2015   End stage renal disease ([place]) 06/12/2015   Unspecified diastolic (congestive) heart failure ([place]) 07/30/2014   CKD (chronic kidney disease) stage 4, GFR 15-29 ml/min (HCC) 06/27/2014   Hypertensive heart disease 05/25/2013   CKD (chronic kidney disease) stage 3, GFR 30-59 ml/min (HCC) 05/25/2013   Hypertensive urgency 05/23/2013   Pulmonary edema 05/23/2013   Type 2 diabetes mellitus with diabetic chronic kidney disease ([place]) 05/23/2013   Type 2 diabetes mellitus with hyperosmolar nonketotic hyperglycemia ([place]) 04/13/2013   Acute on chronic diastolic heart failure ([place]) 04/13/2013   Gastrointestinal hemorrhage, unspecified 03/31/2013   Hypokalemia 08/24/2012   Hyperlipidemia associated with type 2  diabetes mellitus ([place]) 12/27/2007   Allergic rhinitis due to pollen 12/27/2007   Unspecified asthma, uncomplicated 62/69/4854    Orientation RESPIRATION BLADDER Height & Weight     Self, Time, Situation, Place  O2 (2L O2) Continent Weight: 109 lb 12.6 oz (49.8 kg) Height:  '5\' 5"'$  (165.1 cm)  BEHAVIORAL SYMPTOMS/MOOD NEUROLOGICAL BOWEL NUTRITION STATUS      Incontinent Diet (See dc summary)  AMBULATORY STATUS COMMUNICATION OF NEEDS Skin   Extensive Assist Verbally Surgical wounds, Other (Comment) (Closed incision on foot; skin tear on buttocks)                       Personal Care Assistance Level of Assistance  Bathing, Feeding, Dressing Bathing Assistance: Maximum assistance Feeding assistance: Limited assistance Dressing Assistance: Maximum assistance     Functional Limitations Info  Sight, Hearing Sight Info: Impaired Hearing Info: Impaired      SPECIAL CARE FACTORS FREQUENCY  PT (By licensed PT), OT (By licensed OT)     PT Frequency: 5x/week OT Frequency: 5x/week            Contractures Contractures Info: Not present    Additional Factors Info  Code Status, Allergies Code Status Info: Full Allergies Info: Adhesive  (Tape), Lactose Intolerance (Gi), Lisinopril, Prednisone           Current Medications (08/25/2022):  This is the current hospital active medication list Current Facility-Administered Medications  Medication Dose Route Frequency Provider  Last Rate Last Admin   acidophilus (RISAQUAD) capsule 1 capsule  1 capsule Oral Daily Elgergawy, Silver Huguenin, MD   1 capsule at 08/25/22 1013   albuterol (PROVENTIL) (2.5 MG/3ML) 0.083% nebulizer solution 2.5 mg  2.5 mg Nebulization Q2H PRN Clance Boll, MD       atorvastatin (LIPITOR) tablet 40 mg  40 mg Oral QHS Caren Griffins, MD   40 mg at 08/14/2022 2100   busPIRone (BUSPAR) tablet 7.5 mg  7.5 mg Oral Daily PRN Caren Griffins, MD   7.5 mg at 08/16/22 0047   carvedilol (COREG) tablet 3.125 mg   3.125 mg Oral BID WC Lala Lund K, MD   3.125 mg at 08/25/22 1013   ceFAZolin (ANCEF) IVPB 2g/100 mL premix  2 g Intravenous Q M,W,F-1800 Rolla Flatten, RPH 200 mL/hr at 08/16/2022 2055 2 g at 08/22/2022 2055   Chlorhexidine Gluconate Cloth 2 % PADS 6 each  6 each Topical Q0600 Penninger, Ria Comment, [place]   6 each at 08/25/22 4492   doxercalciferol (HECTOROL) injection 7 mcg  7 mcg Intravenous Q M,W,F-HD Roney Jaffe, MD   7 mcg at 08/01/2022 1640   feeding supplement (ENSURE ENLIVE / ENSURE PLUS) liquid 237 mL  237 mL Oral BID BM Jonetta Osgood, MD   237 mL at 08/25/22 1016   feeding supplement (NEPRO CARB STEADY) liquid 237 mL  237 mL Oral BID BM Loren Racer, PA-C   237 mL at 08/25/22 1016   ferric citrate (AURYXIA) tablet 420 mg  420 mg Oral TID WC Ernest Haber, PA-C   420 mg at 08/23/22 1739   fluticasone (FLONASE) 50 MCG/ACT nasal spray 2 spray  2 spray Each Nare Daily PRN Clance Boll, MD       fluticasone furoate-vilanterol (BREO ELLIPTA) 200-25 MCG/ACT 1 puff  1 puff Inhalation Daily Clance Boll, MD   1 puff at 08/25/22 0915   gabapentin (NEURONTIN) capsule 100 mg  100 mg Oral QHS Jonetta Osgood, MD   100 mg at 08/26/2022 2100   Gerhardt's butt cream   Topical TID Jill Alexanders, PA-C   Given at 08/25/22 1016   hydrALAZINE (APRESOLINE) injection 10 mg  10 mg Intravenous Q6H PRN Myles Rosenthal A, MD   10 mg at 08/10/22 1507   insulin aspart (novoLOG) injection 0-6 Units  0-6 Units Subcutaneous TID WC Myles Rosenthal A, MD   1 Units at 08/05/2022 0754   [START ON 08/26/2022] isosorbide-hydrALAZINE (BIDIL) 20-37.5 MG per tablet 1 tablet  1 tablet Oral BID Thurnell Lose, MD       metroNIDAZOLE (FLAGYL) tablet 500 mg  500 mg Oral Q12H Vu, Trung T, MD   500 mg at 08/25/22 1013   midodrine (PROAMATINE) tablet 10 mg  10 mg Oral TID WC Thurnell Lose, MD       multivitamin (RENA-VIT) tablet 1 tablet  1 tablet Oral QHS Elgergawy, Silver Huguenin, MD   1 tablet at  08/19/2022 2100   ondansetron (ZOFRAN) injection 4 mg  4 mg Intravenous Q6H PRN Caren Griffins, MD   4 mg at 08/09/2022 2048   ondansetron (ZOFRAN) tablet 4 mg  4 mg Oral Q6H PRN Caren Griffins, MD   4 mg at 08/03/22 2137   oxyCODONE (Oxy IR/ROXICODONE) immediate release tablet 10 mg  10 mg Oral Q4H PRN Thurnell Lose, MD   10 mg at 08/10/2022 2048   pantoprazole (PROTONIX) EC tablet 40 mg  40 mg Oral BID Elgergawy, Silver Huguenin, MD   40 mg at 08/25/22 1013   prochlorperazine (COMPAZINE) injection 10 mg  10 mg Intravenous Q6H PRN Caren Griffins, MD   10 mg at 08/06/22 0344   traZODone (DESYREL) tablet 25-50 mg  25-50 mg Oral QHS PRN Caren Griffins, MD   50 mg at 08/19/22 2156   ursodiol (ACTIGALL) capsule 300 mg  300 mg Oral BID Caren Griffins, MD   300 mg at 08/05/2022 2119     Discharge Medications: Please see discharge summary for a list of discharge medications.  Relevant Imaging Results:  Relevant Lab Results:   Additional Information SSN: [id number]. Requires outpaitient dialysis [facility] on MWF.  Benard Halsted, LCSW

## 2022-08-25 NOTE — Progress Notes (Addendum)
[facility]  VASCULAR SURGERY ASSESSMENT & PLAN:   POD 1 LEFT AKA: The patient is doing well status post left above-the-knee amputation.  Will plan on a dressing change tomorrow or Thursday.  Gae Gallop, MD 10:30 AM   Subjective  - No new complaints comfortable  Objective (!) 130/56 88 98 F (36.7 C) (Oral) 12 99%  Intake/Output Summary (Last 24 hours) at 08/25/2022 0741 Last data filed at 08/05/2022 1907 Gross per 24 hour  Intake --  Output 1050 ml  Net -1050 ml    Left AKA dressing intact clean and dry Lungs non labored breathing General no acute distress  Assessment/Planning: POD # 1 Left AKA due to ischemic left LE without revascularization options Dressing will be maintained  Leukocytosis she is on IV ancef HGB stable post op 11.9  Roxy Horseman 08/25/2022 7:41 AM --  Laboratory Lab Results: Recent Labs    08/19/2022 0720 08/06/2022 0855 08/25/22 0609  WBC 13.9*  --  15.4*  HGB 11.5* 11.6* 11.9*  HCT 35.5* 34.0* 38.5  PLT 191  --  193   BMET Recent Labs    08/19/2022 0720 08/26/2022 0855  NA 135 135  K 3.8 3.4*  CL 95* 96*  CO2 20*  --   GLUCOSE 146* 152*  BUN 29* 29*  CREATININE 6.28* 6.60*  CALCIUM 10.9*  --     COAG Lab Results  Component Value Date   INR 1.0 08/03/2022   INR 1.1 05/17/2019   INR 1.02 08/23/2017   No results found for: "PTT"

## 2022-08-25 NOTE — Progress Notes (Signed)
[facility] Progress Note  1 Day Post-Op  Subjective: Sitting on the edge of bed with PT.  Having a lot of pain from her AKA yesterday.  Husband at bedside.  Questions answered.  Objective: Vital signs in last 24 hours: Temp:  [97 F (36.1 C)-98.7 F (37.1 C)] 98.1 F (36.7 C) (01/30 0744) Pulse Rate:  [42-123] 92 (01/30 1013) Resp:  [12-26] 14 (01/30 0916) BP: (108-159)/(43-62) 156/62 (01/30 1013) SpO2:  [94 %-100 %] 100 % (01/30 0916) Weight:  [49.8 kg-50.5 kg] 49.8 kg (01/29 1907) Last BM Date : 08/21/22  Intake/Output from previous day: 01/29 0701 - 01/30 0700 In: -  Out: 1050 [Blood:50] Intake/Output this shift: No intake/output data recorded.  PE: Gen:  Alert, NAD Pulm:  rate and effort normal  Abd: soft, ND, mild RLQ tenderness, scant thin/brown fluid in gravity bag  Lab Results:  Recent Labs    07/31/2022 0720 08/09/2022 0855 08/25/22 0609  WBC 13.9*  --  15.4*  HGB 11.5* 11.6* 11.9*  HCT 35.5* 34.0* 38.5  PLT 191  --  193   BMET Recent Labs    08/22/2022 0720 07/30/2022 0855  NA 135 135  K 3.8 3.4*  CL 95* 96*  CO2 20*  --   GLUCOSE 146* 152*  BUN 29* 29*  CREATININE 6.28* 6.60*  CALCIUM 10.9*  --    PT/INR No results for input(s): "LABPROT", "INR" in the last 72 hours. CMP     Component Value Date/Time   NA 135 08/03/2022 0855   NA 135 (A) 07/15/2016 0000   K 3.4 (L) 08/15/2022 0855   CL 96 (L) 08/07/2022 0855   CO2 20 (L) 08/09/2022 0720   GLUCOSE 152 (H) 08/15/2022 0855   BUN 29 (H) 08/16/2022 0855   BUN 30 (A) 07/15/2016 0000   CREATININE 6.60 (H) 08/14/2022 0855   CALCIUM 10.9 (H) 08/23/2022 0720   PROT 6.0 (L) 08/05/2022 0403   ALBUMIN 2.1 (L) 08/20/2022 0720   AST 15 08/05/2022 0403   ALT 10 08/05/2022 0403   ALKPHOS 73 08/05/2022 0403   BILITOT 0.5 08/05/2022 0403   GFRNONAA 7 (L) 08/09/2022 0720   GFRAA 6 (L) 06/26/2019 0500   Lipase     Component Value Date/Time   LIPASE 32 08/14/2022 1730        Studies/Results: No results found.  Anti-infectives: Anti-infectives (From admission, onward)    Start     Dose/Rate Route Frequency Ordered Stop   08/21/22 1800  ceFAZolin (ANCEF) IVPB 2g/100 mL premix        2 g 200 mL/hr over 30 Minutes Intravenous Every M-W-F (1800) 08/19/22 1514     08/20/22 1400  ceFAZolin (ANCEF) IVPB 1 g/50 mL premix        1 g 100 mL/hr over 30 Minutes Intravenous  Once 08/19/22 1349 08/20/22 1548   08/20/22 0000  ceFAZolin (ANCEF) IVPB 2g/100 mL premix  Status:  Discontinued        2 g 200 mL/hr over 30 Minutes Intravenous Every M-W-F (Hemodialysis) 08/19/22 1349 08/19/22 1514   08/19/22 1330  ceFAZolin (ANCEF) IVPB 2g/100 mL premix  Status:  Discontinued        2 g 200 mL/hr over 30 Minutes Intravenous Every M-W-F (Hemodialysis) 08/19/22 1344 08/19/22 1349   08/10/22 1400  cefTRIAXone (ROCEPHIN) 2 g in sodium chloride 0.9 % 100 mL IVPB  Status:  Discontinued        2 g 200 mL/hr over 30 Minutes Intravenous  Every 24 hours 08/10/22 1025 08/19/22 1344   08/10/22 1030  metroNIDAZOLE (FLAGYL) tablet 500 mg        500 mg Oral Every 12 hours 08/10/22 1025     08/08/22 1545  piperacillin-tazobactam (ZOSYN) IVPB 2.25 g  Status:  Discontinued        2.25 g 100 mL/hr over 30 Minutes Intravenous Every 8 hours 08/08/22 1455 08/10/22 1025   08/08/22 1415  ceFAZolin (ANCEF) IVPB 1 g/50 mL premix  Status:  Discontinued        1 g 100 mL/hr over 30 Minutes Intravenous Daily 08/08/22 1327 08/08/22 1454   08/17/2022 2245  piperacillin-tazobactam (ZOSYN) IVPB 2.25 g  Status:  Discontinued        2.25 g 100 mL/hr over 30 Minutes Intravenous Every 8 hours 08/01/2022 2215 08/08/22 1327   08/17/2022 1900  piperacillin-tazobactam (ZOSYN) IVPB 3.375 g        3.375 g 100 mL/hr over 30 Minutes Intravenous  Once 07/28/2022 1853 08/20/2022 2049        Assessment/Plan Suspected perforated appendicitis with pelvic abscess - S/p IR drain placement 1/8. 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 To gravity drainage, no flushes.  - WBC down 15, afebrile. Drain with minimal tan cloudy output - Repeat CT scan 1/24 with some thickening of the pelvic sidewall but more likely this is a phlegmon, drain in place with confirmed fistula, no new issues - Continue current care. Encourage PO intake (RD following, changed Nepro shakes to Ensure yesterday). Husband at bedside. - Hopefully fistula will close over the next few weeks with drain in place.     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    LOS: 23 days    KHenreitta Cea [facility] 08/25/2022, 10:57 AM Please see Amion for pager number during day hours 7:00am-4:30pm

## 2022-08-25 NOTE — Evaluation (Addendum)
Occupational Therapy Evaluation Patient Details Name: Destiny Day MRN: [id number] DOB: 03/08/1951 Today's Date: 08/25/2022   History of Present Illness 72 year old female admitted 1/7  to the hospital with few days of right lower quadrant abdominal pain.  Imaging in the ED concerning for perforated appendicitis with a periappendiceal abscess with probably secondary involvement of the previously noted adnexal mass or a tubo-ovarian abscess.  She was placed on antibiotics, general surgery was consulted and she was admitted to the hospital. JP drain right lower quadrant placed; vascular workup as well, and pt needed L AKA, done 1/29  PMH: ESRD on MWF HD, asthma/COPD, diastolic CHF, HTN, HLD, DM2   Clinical Impression   Patient reassessed this date post L AKA.  Patient with complaint of 10/10 pain, actively resisting any attempts at mobility.  Essentially Mod to Total A for ADL completion at bedlevel, and total assist for basic bed mobility.  This does represent a significant decline in her functional status prior to L AKA.  OT will follow in the acute setting to address deficits listed, and to assist with transition to next level of care.  Currently OT is recommending SNF for post acute rehab, if her pain tolerance improves, and she is able to tolerate OT, AIR could be considered.  Plan of care reviewed and goals adjusted.       Recommendations for follow up therapy are one component of a multi-disciplinary discharge planning process, led by the attending physician.  Recommendations may be updated based on patient status, additional functional criteria and insurance authorization.   Follow Up Recommendations  Skilled nursing-short term rehab (<3 hours/day)     Assistance Recommended at Discharge Frequent or constant Supervision/Assistance  Patient can return home with the following A lot of help with bathing/dressing/bathroom;Assistance with cooking/housework;Direct supervision/assist for  medications management;Direct supervision/assist for financial management;Assist for transportation;Help with stairs or ramp for entrance;Two people to help with walking and/or transfers    Functional Status Assessment  Patient has had a recent decline in their functional status and demonstrates the ability to make significant improvements in function in a reasonable and predictable amount of time.  Equipment Recommendations  Wheelchair (measurements OT);Wheelchair cushion (measurements OT);BSC/3in1;Tub/shower seat    Recommendations for Other Services       Precautions / Restrictions Precautions Precautions: Fall Precaution Comments: drain right lower quadrant; L AKA 1/29; watch BP Required Braces or Orthoses: Other Brace Other Brace: Pt has R shoe with metal upright braces Restrictions Weight Bearing Restrictions: Yes LLE Weight Bearing: Non weight bearing      Mobility Bed Mobility Overal bed mobility: Modified Independent Bed Mobility: Supine to Sit, Sit to Supine     Supine to sit: Total assist, HOB elevated Sit to supine: Total assist   General bed mobility comments: patient actively resisting Patient Response: Cooperative  Transfers                   General transfer comment: deferred due to unable to sit EOB      Balance Overall balance assessment: Needs assistance Sitting-balance support: Bilateral upper extremity supported, Feet supported Sitting balance-Leahy Scale: Zero   Postural control: Posterior lean, Right lateral lean                                 ADL either performed or assessed with clinical judgement   ADL   Eating/Feeding: Minimal assistance;Bed level   Grooming: Wash/dry hands;Wash/dry face;Minimal  assistance;Bed level   Upper Body Bathing: Moderate assistance;Bed level   Lower Body Bathing: Total assistance;Bed level   Upper Body Dressing : Moderate assistance;Bed level   Lower Body Dressing: Total  assistance;Bed level                       Vision Baseline Vision/History: 1 Wears glasses Patient Visual Report: No change from baseline       Perception     Praxis      Pertinent Vitals/Pain Pain Assessment Pain Assessment: Faces Faces Pain Scale: Hurts worst Pain Location: LLE and abdomen Pain Descriptors / Indicators: Crying, Grimacing, Guarding Pain Intervention(s): Monitored during session     Hand Dominance Right   Extremity/Trunk Assessment Upper Extremity Assessment Upper Extremity Assessment: Generalized weakness   Lower Extremity Assessment Lower Extremity Assessment: Defer to PT evaluation       Communication Communication Communication: Expressive difficulties   Cognition Arousal/Alertness: Awake/alert Behavior During Therapy: Anxious, Restless Overall Cognitive Status: No family/caregiver present to determine baseline cognitive functioning                                       General Comments   BP stable 127/65    Exercises     Shoulder Instructions      Home Living Family/patient expects to be discharged to:: Private residence Living Arrangements: Spouse/significant other Available Help at Discharge: Available 24 hours/day Type of Home: House Home Access: Level entry     Home Layout: Two level;Able to live on main level with bedroom/bathroom     Bathroom Shower/Tub: Occupational psychologist: Standard Bathroom Accessibility: Yes How Accessible: Accessible via walker Home Equipment: Brooks (2 wheels);Rollator (4 wheels);Cane - single point;Crutches;Wheelchair - manual          Prior Functioning/Environment Prior Level of Function : Needs assist             Mobility Comments: walks in house with RW on her own; able to put her braces ADLs Comments: sponge bathes to make it easier, husband cooks and cleans        OT Problem List: Decreased strength;Impaired balance (sitting and/or  standing);Decreased cognition;Decreased safety awareness;Decreased knowledge of use of DME or AE;Decreased range of motion;Decreased activity tolerance;Pain      OT Treatment/Interventions: Self-care/ADL training;DME and/or AE instruction;Therapeutic activities;Cognitive remediation/compensation;Patient/family education;Balance training    OT Goals(Current goals can be found in the care plan section) Acute Rehab OT Goals OT Goal Formulation: Patient unable to participate in goal setting Time For Goal Achievement: 09/07/22 Potential to Achieve Goals: Fair ADL Goals Pt Will Perform Upper Body Dressing: with min guard assist;sitting Pt Will Perform Lower Body Dressing: with mod assist;sitting/lateral leans Pt Will Transfer to Toilet: with mod assist;stand pivot transfer;bedside commode Pt Will Perform Toileting - Clothing Manipulation and hygiene: with max assist;sit to/from stand Additional ADL Goal #1: Pt with perform bed mobility with Min A in prep for ADL  OT Frequency: Min 2X/week    Co-evaluation              AM-PAC OT "6 Clicks" Daily Activity     Outcome Measure Help from another person eating meals?: A Little Help from another person taking care of personal grooming?: A Little Help from another person toileting, which includes using toliet, bedpan, or urinal?: Total Help from another person bathing (including washing, rinsing, drying)?: A  Lot Help from another person to put on and taking off regular upper body clothing?: A Lot Help from another person to put on and taking off regular lower body clothing?: Total 6 Click Score: 12   End of Session Equipment Utilized During Treatment: Gait belt Nurse Communication: Mobility status  Activity Tolerance: Patient limited by pain Patient left: in bed;with call bell/phone within reach;with bed alarm set  OT Visit Diagnosis: Unsteadiness on feet (R26.81);Other abnormalities of gait and mobility (R26.89);Muscle weakness  (generalized) (M62.81);Other symptoms and signs involving cognitive function;Pain Pain - Right/Left: Left Pain - part of body: Leg                Time: 2824-1753 OT Time Calculation (min): 18 min Charges:  OT General Charges $OT Visit: 1 Visit OT Evaluation $OT Re-eval: 1 Re-eval  08/25/2022  RP, OTR/L  Acute Rehabilitation Services  Office:  [phone]   Metta Clines 08/25/2022, 4:11 PM

## 2022-08-25 NOTE — Progress Notes (Addendum)
[facility] Progress Note   Subjective:   Seen in room. Did ok with HD after amputation yesterday - had been hypotensive, only 1L net UF. More conversant today, no CP/dyspnea.  Objective Vitals:   08/25/22 0303 08/25/22 0744 08/25/22 0800 08/25/22 0916  BP: (!) 130/56 (!) 157/62 (!) 152/62 (!) 152/62  Pulse: 88  88 90  Resp: '12  15 14  '$ Temp: 98 F (36.7 C) 98.1 F (36.7 C)    TempSrc: Oral Axillary    SpO2: 99%  98% 100%  Weight:      Height:       Physical Exam General: Chronically ill appearing woman, NAD. Nasal O2 in place Heart: RRR; no murmur Lungs: CTA anteriorly Abdomen: soft Extremities: L AKA bandaged, RLE without edema Dialysis Access: L AVG + bruit    Additional Objective Labs: Basic Metabolic Panel: Recent Labs  Lab 08/20/22 0509 08/22/22 0307 08/18/2022 0720 08/09/2022 0855  NA 135 135 135 135  K 3.6 3.1* 3.8 3.4*  CL 95* 94* 95* 96*  CO2 24 25 20*  --   GLUCOSE 146* 178* 146* 152*  BUN 10 9 29* 29*  CREATININE 3.28* 2.95* 6.28* 6.60*  CALCIUM 10.2 10.6* 10.9*  --   PHOS 4.0 3.6 4.5  --    Liver Function Tests: Recent Labs  Lab 08/20/22 0509 08/22/22 0307 08/18/2022 0720  ALBUMIN 2.2* 2.2* 2.1*   CBC: Recent Labs  Lab 08/22/22 0307 08/23/22 0247 07/28/2022 0527 08/09/2022 0720 07/28/2022 0855 08/25/22 0609  WBC 23.1* 18.9* 16.2* 13.9*  --  15.4*  HGB 12.1 11.2* 11.0* 11.5* 11.6* 11.9*  HCT 37.5 35.1* 34.1* 35.5* 34.0* 38.5  MCV 99.2 99.2 97.7 98.9  --  100.3*  PLT 273 229 199 191  --  193   Blood Culture    Component Value Date/Time   SDES WOUND 08/03/2022 1326   SPECREQUEST  ABCESS ABDOMEN 08/03/2022 1326   CULT  08/03/2022 1326    ABUNDANT ESCHERICHIA COLI MODERATE KLEBSIELLA PNEUMONIAE ABUNDANT BACTEROIDES THETAIOTAOMICRON BETA LACTAMASE POSITIVE Performed at [facility], [place] [address] [place], [place] [address]    REPTSTATUS 08/08/2022 FINAL 08/03/2022 1326   Cardiac Enzymes: Recent Labs  Lab  08/23/22 0900  CKTOTAL 275*   CBG: Recent Labs  Lab 08/20/2022 0827 08/01/2022 1110 08/15/2022 1236 08/03/2022 1956 08/25/22 0747  GLUCAP 169* 118* 105* 103* 130*   Medications:   ceFAZolin (ANCEF) IV 2 g (08/22/2022 2055)    acidophilus  1 capsule Oral Daily   amLODipine  10 mg Oral QHS   atorvastatin  40 mg Oral QHS   carvedilol  3.125 mg Oral BID WC   Chlorhexidine Gluconate Cloth  6 each Topical Q0600   doxercalciferol  7 mcg Intravenous Q M,W,F-HD   feeding supplement  237 mL Oral BID BM   ferric citrate  420 mg Oral TID WC   fluticasone furoate-vilanterol  1 puff Inhalation Daily   gabapentin  100 mg Oral QHS   Gerhardt's butt cream   Topical TID   insulin aspart  0-6 Units Subcutaneous TID WC   isosorbide-hydrALAZINE  1 tablet Oral BID   metroNIDAZOLE  500 mg Oral Q12H   multivitamin  1 tablet Oral QHS   pantoprazole  40 mg Oral BID   ursodiol  300 mg Oral BID    Dialysis Orders: [place] MWF 4h, 400/600, EDW 57.4kg, 2K/3.5Ca bath, LUA AVG  Heparin 2500 units IV initial + 1000 units IV mid run - Hectorol  32mg IV q HD - Venofer '50mg'$  weekly - Mircera 364m q 4 wks, last 12/8   Assessment/Plan: Abdominal abscess: Gen surg and IR consulted, started on IV abx. IR placed drain in RLQ fluid collection. Fluid sent for culture - grew E coli/ Klebsiella bacteroides. 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 Repeat CT 1/24 - no new findings.  Per ID - Cefazolin 2 g IV q HD + Flagyl 500 BID until 09/17/22 PAD/critical limb ischemia w/rest pain - CTA on 1/15 w/occluded L fem-pop. VVS consulted with initial plan for angiogram + intervention. Unfortunately, pain and exam worse - s/p L AKA 07/29/2022. ESRD: Continue HD on MWF schedule - HD tomorrow, 1L UFG, 3K bath. Hypokalemia - intermittent with diarrhea. Supplementing prn HTN/ volume: No significant edema on exam.  Continue home amlodipine, carvedilol, BiDil with holding parameters.  Anemia of ESRD: Hgb 11.9, follow for post-op drop and can give ESA prn. Secondary HPTH: CorrCa high, VDRA on hold. Phos ok - continue Auryxia as binder. T2DM COPD/asthma Encephalopathy, multifactorial: Waxing and wanning. Gabapentin on hold. Nutrition: Alb low, continue protein supplements. Currently on regular diet.   KaVeneta PentonPA-C 08/25/2022, 9:41 AM  [facility] attending: The patient was seen and examined.  Chart reviewed.  I agree with above. Tolerated dialysis well yesterday with 1 L ultrafiltration.  She seems to be comfortable this morning.  Plan for regular dialysis tomorrow.  Currently on antibiotics.  Discussed with the patient's daughter as well. D.Katheran JamesCaBrightwoodidney Associates.

## 2022-08-25 NOTE — Consult Note (Signed)
Palliative Care Consult Note                                  Date: 08/25/2022   Patient Name: Destiny Day  DOB: [birth year]  MRN: [id number]  Age / Sex: 72 y.o., female  PCP: Cipriano Mile, NP Referring Physician: Thurnell Lose, MD  Reason for Consultation: Establishing goals of care  HPI/Patient Profile: 72 y.o. female  with past medical history of ESRD on HD MWF, chronic HFpEF, PAD-s/p bilateral TMA who presented with RLQ pain-found to have perforated appendicitis with abscess. Hospital course complicated by worsening left lower extremity rest pain due to critical limb ischemia requiring AKA 07/31/2022. She was admitted on 08/11/2022 with perforated appendicitis with periappendiceal abscess status post drain placement by IR, critical limb ischemia status post femoropopliteal bypass requiring AKA 07/31/2022, ESRD on IHD, acute metabolic encephalopathy, HFrEF, and others.   PMT was consulted for [place] conversations.  Past Medical History:  Diagnosis Date   Abdominal bruit    Anxiety    Arthritis    Osteoarthritis   Asthma    Cervical disc disease    "pinced nerve"   CHF (congestive heart failure) (HCC)    COPD (chronic obstructive pulmonary disease) (HCC)    Diabetes mellitus    Type II   Diverticulitis    ESRD (end stage renal disease) ([place])    dialysis - M/W/F- [place]   GERD (gastroesophageal reflux disease)    from medications   GI bleed 03/31/2013   Head injury 07/2017   History of hiatal hernia    Hyperlipidemia    Hypertension    Neuropathy    left leg   Nuclear sclerotic cataract of right eye 04/23/2020   Osteoporosis    Peripheral vascular disease ([place])    Pneumonia    "very young" and a few years ago   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)    Vitrectomy membrane peel endolaser 05-07-2021   Seasonal allergies    Shortness of breath  dyspnea    WIth exertion, when fluid builds   Sleep apnea    can't afford cpap    Vitreomacular traction syndrome, 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  I met with the patient at the bedside, although she is still intermittently confused and not very communicative.  The patient's eldest son Destiny Day was present at the bedside.   Concept of Palliative Care was introduced as specialized medical care for people and their families living with serious illness.  If focuses on providing relief from the symptoms and stress of a serious illness.  The goal is to improve quality of life for both the patient and the family. Values and goals of care important to patient and family were attempted to be elicited.  Created space and opportunity for patient  and family to explore thoughts and feelings regarding current medical situation   Natural trajectory and current clinical status were discussed. Questions and concerns addressed. Patient  encouraged to call with questions or concerns.  Patient/Family Understanding of Illness: The patient's son knows that she had an infection and is being treated for this.  We had an extensive discussion about her appendicitis, abscess, drain placement, critical limb ischemia requiring AKA, chronic renal disease on hemodialysis, confusion and all the possible (likely multifactorial) reasons for confusion.  He states that he understands better her current medical situation.  Life Review: The patient has been married for 42 years.  She has 3 children, 4 grandchildren, and 1 great grandchild and is soon to  have another great grandchild.  She is described as very tough and proud.  She is also very independent person.  She previously worked as a Consulting civil engineer, custodian, and many other things.  She often had 2 jobs and her son states that the family feels she "worked too hard."  She is now retired, since 10 years ago.  In her free time currently she enjoys TV including shopping channel, [employer] movies.  She is described as religious/spiritual.  I offered chaplaincy support but they would like to hold off until she is little more awake and conversational.  Patient Values: Independence, family, faith  Goals: To get better and be able to leave the hospital  Today's Discussion: In addition to discussions described above we had extensive discussion on various topics.  We started by discussing her chronic illnesses including hemodialysis dependent end-stage renal failure.  He states that she has a very good attitude about her dialysis.  She is seen several people say that they are tired of going through it and stop resulting in them passing away.  On some days she does seem a little more tired with dialysis, although she continues to do it and wants to continue.  In general, she tolerates it pretty well.  We discussed about CODE STATUS.  I explained full code versus DNR including the risks and benefits of each.  At this time he would like to defer CODE STATUS until she is more awake and able to participate in this conversation so that they can ensure that her wishes are being honored.  I feel this is reasonable.  Overall, the family takes turns between the patient's spouse, son Destiny Day, daughter Destiny Day (all who live locally) staying with her.  They tried to avoid leaving her alone very much at all.  The patient's other son Destiny Day lives in [place] and is going to try to come up.  We honored the strong family support that the patient seems to have and I think them for their participation in her care and  involvement in her decisions.  The patient's son states his goal is for her to continue to wake up more and improved.  He does see a noticeable difference today compared to previous days.  We discussed that hopefully as the infection improves, pain improves requiring less medications, and just with time that she will continue to become more oriented.  At this time he feels that that would be a good point for further goals discussion so that she can be involved.  At this time they would like to remain full code and full scope.  We discussed that palliative medicine would back off for a couple days to allow time for outcomes and see how she does.  We are always available to come back if needed sooner.  I provided emotional and general support through therapeutic listening, empathy, sharing of stories, and other techniques. I answered all questions and addressed all concerns to the best of my ability.  Review of Systems  Unable to perform ROS: Acuity of condition    Objective:   Primary Diagnoses: Present on Admission:  Abdominal infection [facility])   Physical Exam Vitals and nursing note reviewed.  HENT:     Head: Normocephalic and atraumatic.  Cardiovascular:     Rate and Rhythm: Normal rate.  Pulmonary:     Effort: Pulmonary effort is normal. No respiratory distress.     Breath sounds: No wheezing or rhonchi.  Abdominal:     General: Abdomen is flat.     Palpations: Abdomen is soft.     Comments: Noted drain RLQ  Skin:    General: Skin is warm and dry.  Neurological:     Mental Status: She is alert. She is disoriented and confused.     Comments: Minimally interactive  Psychiatric:        Behavior: Behavior normal.     Vital Signs:  BP (!) 141/48 (BP Location: Right Arm)   Pulse 91   Temp 98.5 F (36.9 C) (Axillary)   Resp (!) 21   Ht '5\' 5"'$  (1.651 m)   Wt 49.8 kg   SpO2 98%   BMI 18.27 kg/m   Palliative Assessment/Data: 20-30%    Advanced Care Planning:    Existing Vynca/ACP Documentation: None  Primary Decision Maker: NEXT OF KIN  Code Status/Advance Care Planning: Full code  A discussion was had today regarding advanced directives. Concepts specific to code status, artifical feeding and hydration, continued IV antibiotics and rehospitalization was had.  The difference between a aggressive medical intervention path and a palliative comfort care path for this patient at this time was had.   Decisions/Changes to ACP: None today  Assessment & Plan:   Impression: 72 year old female with acute presentation and chronic comorbidities as described above.  She has been through quite a lot with perforated appendicitis with abscess status post drain placement, critical limb ischemia requiring AKA yesterday, end-stage renal disease at baseline on hemodialysis Monday, Wednesday, Friday.  She also has acute metabolic encephalopathy likely secondary to delirium from multifactorial etiology, although this seems to be improving.  At this time family wants to remain full code and full scope.  They are agreeable to continued conversations when the patient is more awake and able to participate.  Overall long-term prognosis guarded to poor  SUMMARY OF RECOMMENDATIONS   Remain full code Full scope of care Continue to treat the treatable Allow time for outcomes Hopefully patient will wake up more in the coming days Further [employer] conversations as patient is more awake/alert PMT will follow-up in a few days Please contact us for any significant clinical changes or new palliative needs  Symptom Management:  Per primary team PMT is available to assist as needed  Prognosis:  Unable to determine  Discharge Planning:  To Be Determined   Discussed with: Patient's family, medical team, nursing team    Thank you for allowing [place] to participate in the care of IVIONA HOLE PMT will continue to support holistically.  Time Total: 90 min  Greater  than 50%  of this time was spent counseling and coordinating care related to the above assessment and plan.  Signed by: Walden Field, NP Palliative Medicine Team  Team Phone # [phone] (Nights/Weekends)  08/25/2022, 4:25 PM

## 2022-08-25 NOTE — Progress Notes (Signed)
Case discussed with CSW. Pt receives out-pt HD at [facility] on MWF. Pt arrives at 10:05 for 10:20 chair time. Info provided to CSW for d/c planning purposes. Will assist as needed.   Melven Sartorius Renal Navigator [phone]

## 2022-08-25 NOTE — Progress Notes (Signed)
Nutrition Follow-up  DOCUMENTATION CODES:   Non-severe (moderate) malnutrition in context of chronic illness  INTERVENTION:   Encourage good PO intake Meal ordering with assist Continue Renal Multivitamin w/ minerals daily Continue Ensure Enlive po BID, each supplement provides 350 kcal and 20 grams of protein. Continue Magic cup BID with meals, each supplement provides 290 kcal and 9 grams of protein 30 ml ProSource Plus BID, each supplement provides 100 kcals and 15 grams protein.   NUTRITION DIAGNOSIS:   Moderate Malnutrition related to chronic illness as evidenced by severe muscle depletion, moderate fat depletion. - Ongoing  GOAL:   Patient will meet greater than or equal to 90% of their needs - Ongoing  MONITOR:   PO intake, Supplement acceptance, Labs, Skin, I & O's  REASON FOR ASSESSMENT:   Rounds    ASSESSMENT:   72 y.o. female presented to the ED with RLQ pain. PMH includes ESRD on HD (MWF), COPD, T2DM, GERD, HLD, and diverticulosis. Pt admitted with intra-abdominal abscess, concern for perforated appendicitis.   1/07 - Admitted 1/08 - drain placed 1/29 - s/p L AKA 1/30 - diet advanced to dysphagia 1  Pt laying in bed, family at bedside. Pt in pain, unable to interact with RD. Family stated that they had just received her meal tray. Discussed encouraging Ensure shake with pt, family agrees and are encouraging that.  Only one meal intake recorded since previous RD visit, 0% on 1/28.  Medications reviewed and include: Risaquad, Hectorol, Ferric citrate, NovoLog SSI, Flagyl, Rena-vit, Protonix, IV antibiotics  Labs reviewed: Sodium 135, Potassium 3.4, Phosphorus 4.5, 24 hr CBGs 103-166  HD on 1/29 EDW: 57.4 kg Net UF: 1000 mL   Diet Order:   Diet Order             DIET - DYS 1 Room service appropriate? Yes; Fluid consistency: Thin  Diet effective now                   EDUCATION NEEDS:   Not appropriate for education at this time  Skin:  Skin  Assessment: Reviewed RN Assessment  Last BM:  1/26  Height:   Ht Readings from Last 1 Encounters:  08/01/2022 '5\' 5"'$  (1.651 m)    Weight:   Wt Readings from Last 1 Encounters:  08/23/2022 49.8 kg   Ideal Body Weight:  56.8 kg  BMI:  Body mass index is 18.27 kg/m.  Estimated Nutritional Needs:  Kcal:  1700-1900 Protein:  85-100 grams Fluid:  UOP + 1 L   [name], LDN Clinical Dietitian See [facility] for contact information.

## 2022-08-25 NOTE — Evaluation (Signed)
Physical Therapy Re-Evaluation Patient Details Name: Destiny Day MRN: [id number] DOB: Aug 27, 1950 Today's Date: 08/25/2022  History of Present Illness  72 year old female admitted 1/7  to the hospital with few days of right lower quadrant abdominal pain.  Imaging in the ED concerning for perforated appendicitis with a periappendiceal abscess with probably secondary involvement of the previously noted adnexal mass or a tubo-ovarian abscess.  She was placed on antibiotics, general surgery was consulted and she was admitted to the hospital. JP drain right lower quadrant placed; vascular workup as well, and pt needed L AKA, done 1/29  PMH: ESRD on MWF HD, asthma/COPD, diastolic CHF, HTN, HLD, DM2  Clinical Impression   Pt admitted with above diagnosis. Lives at home with family, in a two-level home with a level entry; Destiny Day household distances with assistive device prior to admission; here acutely, working on transfers and gait; noting decr ability to participate 1/25 due to lethargy; s/p L AKA on 1/29; Presents to PT post L AKA with significant functional dependencies, decr activity tolerance, BP drifting downward with incr sitting activity and transfers, and pain is also a limiting factor;  Currently requires 2 person Max assist to sit up to EOB and transfer OOB to recliner; Pt currently with functional limitations due to the deficits listed below (see PT Problem List). Pt will benefit from skilled PT to increase their independence and safety with mobility to allow discharge to the venue listed below.       Recommending post-acute rehab, and husband very much wants to consider AIR; He and daughter were present and verbalized understanding that pt will need to be able to tolerate intensive therapies, and have a plan for around the clock assistance when she gets home; Family indicates they are working on a plan for more assist for Destiny Day at home; We can consider AIR -- at this time, she would not be  able to tolerate the intensity, but will continue work with her and watch for progress     Recommendations for follow up therapy are one component of a multi-disciplinary discharge planning process, led by the attending physician.  Recommendations may be updated based on patient status, additional functional criteria and insurance authorization.  Follow Up Recommendations Other (comment) (Post-acute Physical Rehabilitation) Can patient physically be transported by private vehicle: No    Assistance Recommended at Discharge Frequent or constant Supervision/Assistance  Patient can return home with the following  Two people to help with walking and/or transfers;Assistance with cooking/housework;A lot of help with bathing/dressing/bathroom;Assist for transportation;Direct supervision/assist for medications management    Equipment Recommendations Wheelchair (measurements PT);Wheelchair cushion (measurements PT);Other (comment);Hospital bed (can consider sliding board)  Recommendations for Other Services       Functional Status Assessment Patient has had a recent decline in their functional status and demonstrates the ability to make significant improvements in function in a reasonable and predictable amount of time.     Precautions / Restrictions Precautions Precautions: Fall Precaution Comments: drain right lower quadrant; L AKA 1/29; watch BP, drop with incr time sitting on 1/30 Required Braces or Orthoses: Other Brace Other Brace: Pt has bil shoes with metal upright braces Restrictions LLE Weight Bearing: Non weight bearing (AKA)      Mobility  Bed Mobility Overal bed mobility: Needs Assistance Bed Mobility: Supine to Sit     Supine to sit: HOB elevated, Max assist     General bed mobility comments: pt requiring mod-maxA for bed mobility today for initiation, trunk support, and BLE  management    Transfers Overall transfer level: Needs assistance Equipment used: 2 person hand  held assist Transfers: Bed to chair/wheelchair/BSC       Squat pivot transfers: Max assist, Total assist     General transfer comment: Opted to help pt OOB to recliner at her request to get up to chair; Squat pivot transfer with 2 person Max assist using bed pad and R knee block for stability; very painful in recliner and noted BPs drifting downward with incr time in recliner; Assisted pt back to bed with 3 person assist via drawsheet transfer    Ambulation/Gait                  Stairs            Wheelchair Mobility    Modified Rankin (Stroke Patients Only)       Balance     Sitting balance-Leahy Scale: Zero (progressing to Poor with more time sitting) Sitting balance - Comments: Heavy antlagic lean to R (away from painful L residual limb), needing Max assist to sit up initially; with incr time sitting EOB, pt able to progress to min-mod assist to sit up                                     Pertinent Vitals/Pain Pain Assessment Pain Assessment: Faces Faces Pain Scale: Hurts whole lot Pain Location: LLE and abdomen; pain at her bottom end of session back in bed; repositioned into L semi-sidelying (with noteworhty look of relaxation/relative comfort) Pain Descriptors / Indicators: Crying, Grimacing, Guarding (Antalgic push away from LLE in sitting) Pain Intervention(s): Monitored during session, Repositioned    Home Living Family/patient expects to be discharged to:: Private residence Living Arrangements: Spouse/significant other Available Help at Discharge: Family (Family working on getting at or near 24 hour assist) Type of Home: House Home Access: Level entry       Home Layout: Two level;Able to live on main level with bedroom/bathroom Home Equipment: Rolling Walker (2 wheels);Rollator (4 wheels);Cane - single point;Crutches;Wheelchair - manual      Prior Function Prior Level of Function : Needs assist             Mobility  Comments: walks in house with RW on her own; able to put her braces ADLs Comments: sponge bathes to make it easier, husband cooks and cleans     Hand Dominance   Dominant Hand: Right    Extremity/Trunk Assessment   Upper Extremity Assessment Upper Extremity Assessment: Generalized weakness    Lower Extremity Assessment Lower Extremity Assessment: Generalized weakness;RLE deficits/detail;LLE deficits/detail RLE Deficits / Details: Weak; needs assistance to move in all planes; difficult MMT due to decr cognition; knee blocked for stability during pivot transfer bed to recliner LLE Deficits / Details: S/p L AKA on 1/29; very painful, but tolerated gentle light touch/desensitization LLE: Unable to fully assess due to pain    Cervical / Trunk Assessment Cervical / Trunk Assessment: Kyphotic  Communication   Communication: Expressive difficulties  Cognition Arousal/Alertness: Awake/alert Behavior During Therapy: Restless Overall Cognitive Status: Impaired/Different from baseline Area of Impairment: Memory, Following commands, Safety/judgement, Problem solving                 Orientation Level: Disoriented to, Place, Time, Situation   Memory: Decreased short-term memory Following Commands: Follows one step commands with increased time, Follows one step commands inconsistently Safety/Judgement: Decreased awareness of  safety, Decreased awareness of deficits   Problem Solving: Slow processing, Decreased initiation, Difficulty sequencing, Requires verbal cues, Requires tactile cues General Comments: Very painful, especially with transition movements this session        General Comments General comments (skin integrity, edema, etc.): See other note of this date for details re: BP response to activity    Exercises     Assessment/Plan    PT Assessment Patient needs continued PT services  PT Problem List Decreased activity tolerance;Decreased balance;Decreased  mobility;Decreased knowledge of use of DME;Decreased safety awareness;Decreased knowledge of precautions;Decreased cognition;Decreased strength;Decreased range of motion;Cardiopulmonary status limiting activity;Pain       PT Treatment Interventions DME instruction;Functional mobility training;Therapeutic activities;Therapeutic exercise;Balance training;Neuromuscular re-education;Cognitive remediation;Patient/family education;Wheelchair mobility training;Gait training    PT Goals (Current goals can be found in the Care Plan section)  Acute Rehab PT Goals Patient Stated Goal: Husband very much wants to get pt to Acute Inpt Rehab PT Goal Formulation: With patient/family Time For Goal Achievement: 09/08/22 Potential to Achieve Goals: Good    Frequency Min 3X/week     Co-evaluation               AM-PAC PT "6 Clicks" Mobility  Outcome Measure Help needed turning from your back to your side while in a flat bed without using bedrails?: A Lot Help needed moving from lying on your back to sitting on the side of a flat bed without using bedrails?: Total Help needed moving to and from a bed to a chair (including a wheelchair)?: Total Help needed standing up from a chair using your arms (e.g., wheelchair or bedside chair)?: Total Help needed to walk in hospital room?: Total Help needed climbing 3-5 steps with a railing? : Total 6 Click Score: 7    End of Session Equipment Utilized During Treatment: Oxygen;Other (comment) (bed pads) Activity Tolerance: Patient limited by pain;Other (comment) (and with BP drop with sitting, more upright activity) Patient left: in bed;with call bell/phone within reach;with bed alarm set;with family/visitor present Nurse Communication: Mobility status PT Visit Diagnosis: Muscle weakness (generalized) (M62.81);Pain;Other (comment) (postural hypotension) Pain - Right/Left: Left Pain - part of body: Leg (residual limb, pain at bottom, abdominal pain)     Time: 4268-3419 PT Time Calculation (min) (ACUTE ONLY): 45 min   Charges:   PT Evaluation $PT Re-evaluation: 1 Re-eval PT Treatments $Therapeutic Activity: 23-37 mins        Roney Marion, PT  Acute Rehabilitation Services Office [place] 08/25/2022, 11:59 AM

## 2022-08-26 DIAGNOSIS — K659 Peritonitis, unspecified: Secondary | ICD-10-CM | POA: Diagnosis not present

## 2022-08-26 LAB — GLUCOSE, CAPILLARY
Glucose-Capillary: 136 mg/dL — ABNORMAL HIGH (ref 70–99)
Glucose-Capillary: 140 mg/dL — ABNORMAL HIGH (ref 70–99)
Glucose-Capillary: 140 mg/dL — ABNORMAL HIGH (ref 70–99)
Glucose-Capillary: 311 mg/dL — ABNORMAL HIGH (ref 70–99)

## 2022-08-26 LAB — CBC
HCT: 35.2 % — ABNORMAL LOW (ref 36.0–46.0)
Hemoglobin: 11.6 g/dL — ABNORMAL LOW (ref 12.0–15.0)
MCH: 32 pg (ref 26.0–34.0)
MCHC: 33 g/dL (ref 30.0–36.0)
MCV: 97 fL (ref 80.0–100.0)
Platelets: 210 10*3/uL (ref 150–400)
RBC: 3.63 MIL/uL — ABNORMAL LOW (ref 3.87–5.11)
RDW: 15.4 % (ref 11.5–15.5)
WBC: 13.7 10*3/uL — ABNORMAL HIGH (ref 4.0–10.5)
nRBC: 0.3 % — ABNORMAL HIGH (ref 0.0–0.2)

## 2022-08-26 LAB — SURGICAL PATHOLOGY

## 2022-08-26 MED ORDER — ACETAMINOPHEN 325 MG PO TABS
ORAL_TABLET | ORAL | Status: AC
  Administered 2022-08-26: 650 mg via ORAL
  Filled 2022-08-26: qty 2

## 2022-08-26 MED ORDER — TRAZODONE 25 MG HALF TABLET: 25.0000 mg | ORAL_TABLET | Freq: Every evening | ORAL | Status: DC | PRN

## 2022-08-26 MED ORDER — ACETAMINOPHEN 325 MG PO TABS: 650.0000 mg | ORAL_TABLET | Freq: Once | ORAL | Status: AC

## 2022-08-26 MED ORDER — NYSTATIN 100000 UNIT/GM EX POWD
Freq: Three times a day (TID) | CUTANEOUS | Status: DC
  Filled 2022-08-26: qty 15

## 2022-08-26 MED ORDER — NALOXONE HCL 0.4 MG/ML IJ SOLN
INTRAMUSCULAR | Status: AC
  Administered 2022-08-26: 0.2 mg via INTRAVENOUS
  Filled 2022-08-26: qty 1

## 2022-08-26 MED ORDER — NALOXONE HCL 0.4 MG/ML IJ SOLN
INTRAMUSCULAR | Status: AC
  Administered 2022-08-26: 0.4 mg via INTRAVENOUS
  Filled 2022-08-26: qty 1

## 2022-08-26 MED ORDER — NALOXONE HCL 0.4 MG/ML IJ SOLN
0.2000 mg | INTRAMUSCULAR | Status: DC | PRN
  Administered 2022-08-27 (×2): 0.2 mg via INTRAVENOUS
  Filled 2022-08-26: qty 1

## 2022-08-26 MED ORDER — HEPARIN SODIUM (PORCINE) 5000 UNIT/ML IJ SOLN
5000.0000 [IU] | Freq: Three times a day (TID) | INTRAMUSCULAR | Status: DC
  Administered 2022-08-27 – 2022-08-28 (×3): 5000 [IU] via SUBCUTANEOUS
  Filled 2022-08-26 (×3): qty 1

## 2022-08-26 MED ORDER — OXYCODONE HCL 5 MG PO TABS
5.0000 mg | ORAL_TABLET | ORAL | Status: DC | PRN
  Administered 2022-08-26 (×2): 5 mg via ORAL
  Filled 2022-08-26 (×3): qty 1

## 2022-08-26 NOTE — Progress Notes (Signed)
Case discussed with CSW. Pt for snf placement at [facility] and snf is requesting a different clinic while pt at rehab. Referral submitted this morning to [facility] admissions with request for SW, Emilie Rutter, or [place]. Will assist as needed.   Melven Sartorius Renal Navigator [phone]

## 2022-08-26 NOTE — Progress Notes (Signed)
SLP Cancellation Note  Patient Details Name: Destiny Day MRN: [id number] DOB: May 02, 1951   Cancelled treatment:        Pt in HD currently. Will continue efforts.    Houston Siren 08/26/2022, 12:22 PM

## 2022-08-26 NOTE — Consult Note (Addendum)
[place] Nurse Consult Note: Reason for Consult: Consult requested for buttocks.  Performed remotely after review of progress notes.  Pt is noted to have red moist macerated painful partial thickness skin loss.  Description is consistent with moisture associated skin damage.  ICD-10 CM Codes for Irritant Dermatitis L24A2 - Due to fecal, urinary or dual incontinence  Pt already has Gerhardts butt cream ordered for topical treatment.  There are limited options for this condition if the patient remains incontinent of stool frequently. Air mattress ordered to increase airflow and promote healing. Topical treatment orders provided for bedside nurses to perform as follows to protect skin and repel moisture: Apply Gerhardts cream to buttocks and perineum TID and PRN when turning and cleaning, then apply antifungal powder over the cream each time.  Please re-consult if further assistance is needed.  Thank-you,  Julien Girt MSN, [place], [place], [place], [place]

## 2022-08-26 NOTE — Progress Notes (Signed)
PROGRESS NOTE        PATIENT DETAILS Name: Destiny Day Age: 72 y.o. Sex: female Date of Birth: [birth year] Admit Date: 07/29/2022 Admitting Physician Lorrin Goodell, MD RDE:YCXKG, Rachel Moulds, NP  Brief Summary: Patient is a 72 y.o.  female with history of ESRD on HD MWF, chronic HFpEF, PAD-s/p bilateral TMA who presented with RLQ pain-found to have perforated appendicitis with abscess.  Hospital course complicated by worsening left lower extremity rest pain due to critical limb ischemia.  See below for further details.  Significant events: 1/07>> admitted TRH-RLQ pain-subsequently found to have perforated appendicitis with abscess.  Significant studies: 1/07>> RUQ ultrasound: Cholelithiasis with positive sonographic Murphy sign. 1/07>> CT abdomen/pelvis: Findings suspicious for perforated appendicitis with periappendiceal abscess 1/11>> CT abdomen/pelvis: Interval decrease in size of the right pelvic abscess-s/p percutaneous drainage. 1/15>> HIDA scan: No evidence of acute cholecystitis. 1/15>> CT angio abdominal aorta: Severe atherosclerotic disease throughout abdomen/pelvis and bilateral lower extremities.  Severe left outflow disease with occlusion of left femoral-popliteal bypass graft, occlusion of native left SFA.  Complex collection in the right hemipelvis-enlarged since 1/11 study. 1/22>> CT head:No acute abnormality 1/24>> CT abdomen/pelvis: Drainage catheter in place-in the region of complex abnormality in the right pelvic sidewall, residual component 4.9 x 5.3 x 4.7 cm.  No clinical worsening.  Significant microbiology data: 1/08>> appendix abscess culture: E. 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 Donzetta Matters vascular surgery  Consults: IR ID Vascular surgery General surgery Nephrology Pall Care  Subjective: Patient somnolent but denies any headache  or chest pain, left leg pain better.   Objective: Vitals: Blood pressure (!) 170/54, pulse 89, temperature 98.1 F (36.7 C), temperature source Axillary, resp. rate 16, height '5\' 5"'$  (1.651 m), weight 51 kg, SpO2 91 %.   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   Leukocytosis now slowly downtrending Repeat CT-with some residual collection but no new findings. ID switched antibiotics-current recommendations are for cefazolin (given with HD) and Flagyl-end date 09/17/22.  Needs weekly CBC/CMP/CRP.  ID follow-up arranged for 2/20. General surgery continues to recommend supportive care.    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  Palliative care has also been consulted for goals of care discussion.   ESRD on HD MWF - Nephrology following and directing care  Hypokalemia - Repleted.  Acute metabolic encephalopathy Hospital delirium In the setting of acute illness/appendix abscess, underlying delirium and encephalopathy had improved.  CT head done  on 08/17/2022 was  unremarkable.  She has developed some narcotic induced toxic encephalopathy on 08/26/2022, improved after Narcan, narcotic dose    HFrEF Euvolemic Volume removal with HD  HLD Continue statin  HTN Pressure running low will adjust blood pressure medications on 08/22/2022.  Neuropathic pain Rest  pain associated PAD Neurontin  Anxiety Continue with BuSpar as needed  Asthma COPD overlap No wheezing Continue bronchodilators  Complex right ovarian mass Outpatient GYN follow-up-patient/spouse aware of this recommendation-potential malignancy  Left upper pole renal lesion Concern for malignancy-needs outpatient follow-up with urology for further workup  Debility/deconditioning PT/OT eval Home health recommended  DM-2 (A1c 6.2 on 1/8) CBGs stable with SSI  Recent Labs    08/25/22 2156 08/26/22 0758 08/26/22 0837  GLUCAP 109* 136* 140*    Nutrition Status: Nutrition Problem: Moderate Malnutrition Etiology: chronic illness Signs/Symptoms: severe muscle depletion, moderate fat depletion Interventions: Ensure Enlive (each supplement provides 350kcal and 20 grams of protein), MVI, Magic cup, Prostat  BMI: Estimated body mass index is 18.71 kg/m as calculated from the following:   Height as of this encounter: '5\' 5"'$  (1.651 m).   Weight as of this encounter: 51 kg.   Code status:   Code Status: Full Code   DVT Prophylaxis: heparin injection 5,000 Units Start: 08/26/22 0830 SCDs Start: 08/18/2022 2213 IV heparin   Family Communication: Spouse [name]-[phone] updated 08/24/25  Disposition Plan: Status is: Inpatient Remains inpatient appropriate because: Severity of illness.   Planned Discharge Destination:Home health   Diet: Diet Order             DIET - DYS 1 Room service appropriate? Yes; Fluid consistency: Thin  Diet effective now                    MEDICATIONS: Scheduled Meds:  (feeding supplement) PROSource Plus  30 mL Oral BID BM    acidophilus  1 capsule Oral Daily   atorvastatin  40 mg Oral QHS   carvedilol  3.125 mg Oral BID WC   Chlorhexidine Gluconate Cloth  6 each Topical Q0600   doxercalciferol  7 mcg Intravenous Q M,W,F-HD   feeding supplement  237 mL Oral BID BM   feeding supplement (NEPRO CARB STEADY)  237 mL Oral BID BM   ferric citrate  420 mg Oral TID WC   fluticasone furoate-vilanterol  1 puff Inhalation Daily   gabapentin  100 mg Oral QHS   Gerhardt's butt cream   Topical TID   heparin  5,000 Units Subcutaneous Q8H   insulin aspart  0-6 Units Subcutaneous TID WC   isosorbide-hydrALAZINE  1 tablet Oral BID   metroNIDAZOLE  500 mg Oral Q12H   midodrine  10 mg Oral TID WC   multivitamin  1 tablet Oral QHS   naloxone       pantoprazole  40 mg Oral BID   ursodiol  300 mg Oral BID   Continuous Infusions:   ceFAZolin (ANCEF) IV 2 g (07/31/2022 2055)   PRN Meds:.albuterol, busPIRone, fluticasone, hydrALAZINE, naloxone, naLOXone (NARCAN)  injection, ondansetron (ZOFRAN) IV, ondansetron, oxyCODONE, prochlorperazine, traZODone   I have personally reviewed following labs and imaging studies  LABORATORY DATA:  Recent Labs  Lab 08/23/22 0247 08/17/2022 0527 08/26/2022 0720 08/26/2022 0855 08/25/22 0609 08/26/22 0709  WBC 18.9* 16.2* 13.9*  --  15.4* 13.7*  HGB 11.2* 11.0* 11.5* 11.6* 11.9* 11.6*  HCT 35.1* 34.1* 35.5* 34.0* 38.5 35.2*  PLT 229 199 191  --  193 210  MCV  99.2 97.7 98.9  --  100.3* 97.0  MCH 31.6 31.5 32.0  --  31.0 32.0  MCHC 31.9 32.3 32.4  --  30.9 33.0  RDW 15.1 15.4 15.4  --  15.4 15.4    Recent Labs  Lab 08/20/22 0509 08/22/22 0307 08/01/2022 0720 08/15/2022 0855  NA 135 135 135 135  K 3.6 3.1* 3.8 3.4*  CL 95* 94* 95* 96*  CO2 24 25 20*  --   ANIONGAP 16* 16* 20*  --   GLUCOSE 146* 178* 146* 152*  BUN 10 9 29* 29*  CREATININE 3.28* 2.95* 6.28* 6.60*  ALBUMIN 2.2* 2.2* 2.1*  --   CALCIUM 10.2 10.6* 10.9*  --      LOS: 24 days   Signature  -    Lala Lund M.D on  08/26/2022 at 9:03 AM   -  To page go to [email or web address]

## 2022-08-26 NOTE — Progress Notes (Addendum)
[facility]  VASCULAR SURGERY ASSESSMENT & PLAN:   POD 2 LEFT AKA: Her dressing is dry.  Plan dressing change tomorrow.  DVT PROPHYLAXIS: I have started DVT prophylaxis.  Gae Gallop, MD 8:18 AM    Subjective  - Resting peacefully this am   Objective (!) 157/57 89 98.3 F (36.8 C) (Axillary) 16 91%  Intake/Output Summary (Last 24 hours) at 08/26/2022 0715 Last data filed at 08/26/2022 0410 Gross per 24 hour  Intake --  Output 1 ml  Net -1 ml    Vitals are stable Left AKA dressing clean and dry   Assessment/Planning: POD # 2 left AKA  Plan for dressing change tomorrow, patient is resting well this am. HD today No new labs  Roxy Horseman 08/26/2022 7:15 AM --  Laboratory Lab Results: Recent Labs    08/09/2022 0720 07/28/2022 0855 08/25/22 0609  WBC 13.9*  --  15.4*  HGB 11.5* 11.6* 11.9*  HCT 35.5* 34.0* 38.5  PLT 191  --  193   BMET Recent Labs    07/30/2022 0720 08/03/2022 0855  NA 135 135  K 3.8 3.4*  CL 95* 96*  CO2 20*  --   GLUCOSE 146* 152*  BUN 29* 29*  CREATININE 6.28* 6.60*  CALCIUM 10.9*  --     COAG Lab Results  Component Value Date   INR 1.0 08/03/2022   INR 1.1 05/17/2019   INR 1.02 08/23/2017   No results found for: "PTT"

## 2022-08-26 NOTE — Progress Notes (Signed)
[facility] Progress Note   Subjective:  Seen on HD. Per RN, when first came in to unit she was very lethargic with reduced RR, Dr. Augustin Coupe was walking by and assessed her and ordered narcan which reversed symptoms. Unfortunately, she she stooled herself and now she is in severe pain to her bottom after being cleaned - RN reports her buttocks is raw. Will send message to her hospitalist and floor RN.  Objective Vitals:   08/26/22 0847 08/26/22 0900 08/26/22 0915 08/26/22 1000  BP: (!) 164/78  (!) 166/51 131/61  Pulse: 95 95 88 91  Resp: '13  14 15  '$ Temp: 98.5 F (36.9 C)     TempSrc: Oral     SpO2: 97%  98% 99%  Weight:      Height:       Physical Exam General: Chronically ill appearing woman, NAD. Nasal O2 in place. Answering questions appropriately. Heart: RRR; no murmur Lungs: CTA anteriorly Abdomen: soft Extremities: L AKA bandaged, RLE without edema Dialysis Access: L AVG + bruit  Additional Objective Labs: Basic Metabolic Panel: Recent Labs  Lab 08/20/22 0509 08/22/22 0307 07/30/2022 0720 08/03/2022 0855  NA 135 135 135 135  K 3.6 3.1* 3.8 3.4*  CL 95* 94* 95* 96*  CO2 24 25 20*  --   GLUCOSE 146* 178* 146* 152*  BUN 10 9 29* 29*  CREATININE 3.28* 2.95* 6.28* 6.60*  CALCIUM 10.2 10.6* 10.9*  --   PHOS 4.0 3.6 4.5  --    Liver Function Tests: Recent Labs  Lab 08/20/22 0509 08/22/22 0307 07/27/2022 0720  ALBUMIN 2.2* 2.2* 2.1*   CBC: Recent Labs  Lab 08/23/22 0247 07/31/2022 0527 08/19/2022 0720 07/30/2022 0855 08/25/22 0609 08/26/22 0709  WBC 18.9* 16.2* 13.9*  --  15.4* 13.7*  HGB 11.2* 11.0* 11.5* 11.6* 11.9* 11.6*  HCT 35.1* 34.1* 35.5* 34.0* 38.5 35.2*  MCV 99.2 97.7 98.9  --  100.3* 97.0  PLT 229 199 191  --  193 210   Cardiac Enzymes: Recent Labs  Lab 08/23/22 0900  CKTOTAL 275*   CBG: Recent Labs  Lab 08/25/22 1227 08/25/22 1608 08/25/22 2156 08/26/22 0758 08/26/22 0837  GLUCAP 166* 155* 109* 136* 140*   Medications:    ceFAZolin (ANCEF) IV 2 g (07/29/2022 2055)    (feeding supplement) PROSource Plus  30 mL Oral BID BM   acidophilus  1 capsule Oral Daily   atorvastatin  40 mg Oral QHS   carvedilol  3.125 mg Oral BID WC   Chlorhexidine Gluconate Cloth  6 each Topical Q0600   doxercalciferol  7 mcg Intravenous Q M,W,F-HD   feeding supplement  237 mL Oral BID BM   feeding supplement (NEPRO CARB STEADY)  237 mL Oral BID BM   ferric citrate  420 mg Oral TID WC   fluticasone furoate-vilanterol  1 puff Inhalation Daily   gabapentin  100 mg Oral QHS   Gerhardt's butt cream   Topical TID   heparin  5,000 Units Subcutaneous Q8H   insulin aspart  0-6 Units Subcutaneous TID WC   isosorbide-hydrALAZINE  1 tablet Oral BID   metroNIDAZOLE  500 mg Oral Q12H   midodrine  10 mg Oral TID WC   multivitamin  1 tablet Oral QHS   naloxone       pantoprazole  40 mg Oral BID   ursodiol  300 mg Oral BID    Dialysis Orders: [place] MWF 4h, 400/600, EDW 57.4kg, 2K/3.5Ca bath, LUA AVG  Heparin 2500 units IV initial + 1000 units IV mid run - Hectorol 64mg IV q HD - Venofer '50mg'$  weekly - Mircera 350m q 4 wks, last 12/8   Assessment/Plan: Abdominal abscess: Gen surg and IR consulted, started on IV abx. IR placed drain in RLQ fluid collection. Fluid sent for culture - grew E coli/ Klebsiella bacteroides. 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 Repeat CT 1/24 - no new findings.  Per ID - Cefazolin 2 g IV q HD + Flagyl 500 BID until 09/17/22 PAD/critical limb ischemia w/rest pain - CTA on 1/15 w/occluded L fem-pop. VVS consulted with initial plan for angiogram + intervention. Unfortunately, pain and exam worse - s/p L AKA 07/29/2022. ESRD: Continue HD on MWF schedule - HD now, low UFG, no heparin. Hypokalemia - intermittent with diarrhea. Supplementing prn HTN/ volume: No significant edema on exam. Continue  home amlodipine, carvedilol, BiDil with holding parameters.  Anemia of ESRD: Hgb 11.6, follow for post-op drop and can give ESA prn. Secondary HPTH: CorrCa high, VDRA on hold. Phos ok - continue Auryxia as binder. T2DM COPD/asthma Encephalopathy, multifactorial: Waxing and wanning. Gabapentin on hold. Nutrition: Alb low, continue protein supplements. Currently on regular diet.   KaVeneta PentonPA-C 08/26/2022, 10:17 AM  [employer]

## 2022-08-26 NOTE — Procedures (Signed)
Patient was seen on dialysis and the procedure was supervised.  BFR 400  Via L AVG BP is  170/54. UF as tolerated.   Patient appears to be tolerating treatment well.  Destiny Day 08/26/2022

## 2022-08-26 NOTE — Procedures (Signed)
HD Note:  Some information was entered later than the data was gathered due to patient care needs. The stated time with the data is accurate.  Received patient in bed to unit.  Informed consent signed and had accompanied her with a packet on her bed.  Patient not responsive to voice or touch.  Dr. Ronnie Derby called unit approximately 5 minutes after the patient arrived on the [place] and ordered narcan and cbg.  Narcan effective and patient became alert and oriented x3.  CBG was148, no intervention required.  The patient had a week cough and there was thick expectorant that was wiped out with a wash cloth and then suctioned within the first 30 min of being on the unit..    O2 applied at 2L/M related to the coughing and sat at 94%.  This was effective  Patient had loose stool just prior to treatment initiation.  Stool is dark and not formed, appears to have mucous in it.  She had another and cream for her buttock and a new sacral patch was sent up from her floor and applied.  This area caused the patient pain, Dr. Candiss Norse ordered tylenol, which was given.  Patient had an episode of hypotension  at the last hour mark of treatment. The UF was turned off and 100 ml bolus saline given.  She recovered her SBP.  See flowsheet  Patient taken off treatment 4 min early as hypotensive BP and change in level of consciousness.. Patient became unresponsive, shallow breaths, starring off without awareness of staff speaking to her. 200 ml of saline given prior to wash back. Rapid Response called, Narcan given again, see MAR Patient did regain consciousness and complained of pain and that she had to go to the bathroom. Patient taken to her room, report given to her nurse, this writer assisted to clean her again after another BM.  Access used: Left upper arm fistual Access issues: None  Total UF removed: 900 ml.  This is with the 200 ml given calculated.     [facility]

## 2022-08-26 NOTE — Significant Event (Signed)
Rapid Response Event Note   Reason for Call :  somnolence  Initial Focused Assessment:  Per HD Rn patient was lethargic when she arrived to dialysis department this morning.  They gave her a dose of Narcan and her mental status improved.  Upon my arrival she will wake up and look at me but doesn't cough when asked and is very drowsy. Lungs with scatter rhonchi BP 124/67  Hr 91  RR 16 O2 sat 96% on [place]    Interventions:  Hd Rn ended treatment a few minutes early  0.4 Narcan given IV Patient's mental status significantly improved. She is able to cough.  She is moaning and calling out.  She denies pain or discomfort. She states that she is tired. Oral Care  BP 109/81  Hr 86 RR 24  O2 sat 97% on [place]  Plan of Care:  Return to nursing department Nephrologist updated Dr Candiss Norse of events while in HD   Event Summary:   MD Notified:  Dr Carolin Sicks Call Time: 0814 Arrival Time: [id number] End Time: [place]  Raliegh Ip, RN

## 2022-08-27 ENCOUNTER — Inpatient Hospital Stay (HOSPITAL_COMMUNITY): Payer: Medicare Other

## 2022-08-27 DIAGNOSIS — K651 Peritoneal abscess: Secondary | ICD-10-CM | POA: Diagnosis not present

## 2022-08-27 DIAGNOSIS — A419 Sepsis, unspecified organism: Secondary | ICD-10-CM | POA: Diagnosis not present

## 2022-08-27 DIAGNOSIS — K659 Peritonitis, unspecified: Secondary | ICD-10-CM | POA: Diagnosis not present

## 2022-08-27 DIAGNOSIS — R6521 Severe sepsis with septic shock: Secondary | ICD-10-CM | POA: Diagnosis not present

## 2022-08-27 DIAGNOSIS — J9601 Acute respiratory failure with hypoxia: Secondary | ICD-10-CM | POA: Diagnosis not present

## 2022-08-27 LAB — BLOOD GAS, ARTERIAL
Acid-base deficit: 2.4 mmol/L — ABNORMAL HIGH (ref 0.0–2.0)
Bicarbonate: 21.6 mmol/L (ref 20.0–28.0)
Drawn by: 406621
O2 Saturation: 96.1 %
Patient temperature: 37
pCO2 arterial: 34 mmHg (ref 32–48)
pH, Arterial: 7.41 (ref 7.35–7.45)
pO2, Arterial: 74 mmHg — ABNORMAL LOW (ref 83–108)

## 2022-08-27 LAB — COMPREHENSIVE METABOLIC PANEL
ALT: 30 U/L (ref 0–44)
AST: 423 U/L — ABNORMAL HIGH (ref 15–41)
Albumin: 2.2 g/dL — ABNORMAL LOW (ref 3.5–5.0)
Alkaline Phosphatase: 68 U/L (ref 38–126)
Anion gap: 20 — ABNORMAL HIGH (ref 5–15)
BUN: 27 mg/dL — ABNORMAL HIGH (ref 8–23)
CO2: 18 mmol/L — ABNORMAL LOW (ref 22–32)
Calcium: 11.7 mg/dL — ABNORMAL HIGH (ref 8.9–10.3)
Chloride: 97 mmol/L — ABNORMAL LOW (ref 98–111)
Creatinine, Ser: 4.08 mg/dL — ABNORMAL HIGH (ref 0.44–1.00)
GFR, Estimated: 11 mL/min — ABNORMAL LOW (ref >60)
Glucose, Bld: 111 mg/dL — ABNORMAL HIGH (ref 70–99)
Potassium: 4.1 mmol/L (ref 3.5–5.1)
Sodium: 135 mmol/L (ref 135–145)
Total Bilirubin: 1.2 mg/dL (ref 0.3–1.2)
Total Protein: 7.1 g/dL (ref 6.5–8.1)

## 2022-08-27 LAB — POCT I-STAT 7, (LYTES, BLD GAS, ICA,H+H)
Acid-base deficit: 5 mmol/L — ABNORMAL HIGH (ref 0.0–2.0)
Bicarbonate: 20.4 mmol/L (ref 20.0–28.0)
Calcium, Ion: 1.48 mmol/L — ABNORMAL HIGH (ref 1.15–1.40)
HCT: 39 % (ref 36.0–46.0)
Hemoglobin: 13.3 g/dL (ref 12.0–15.0)
O2 Saturation: 93 %
Patient temperature: 99.3
Potassium: 3.8 mmol/L (ref 3.5–5.1)
Sodium: 135 mmol/L (ref 135–145)
TCO2: 22 mmol/L (ref 22–32)
pCO2 arterial: 40.8 mmHg (ref 32–48)
pH, Arterial: 7.308 — ABNORMAL LOW (ref 7.35–7.45)
pO2, Arterial: 76 mmHg — ABNORMAL LOW (ref 83–108)

## 2022-08-27 LAB — CBC
HCT: 45 % (ref 36.0–46.0)
HCT: 43.8 % (ref 36.0–46.0)
Hemoglobin: 14.4 g/dL (ref 12.0–15.0)
Hemoglobin: 13.6 g/dL (ref 12.0–15.0)
MCH: 31.3 pg (ref 26.0–34.0)
MCH: 31.6 pg (ref 26.0–34.0)
MCHC: 32 g/dL (ref 30.0–36.0)
MCHC: 31.1 g/dL (ref 30.0–36.0)
MCV: 97.8 fL (ref 80.0–100.0)
MCV: 101.6 fL — ABNORMAL HIGH (ref 80.0–100.0)
Platelets: 176 10*3/uL (ref 150–400)
Platelets: 239 10*3/uL (ref 150–400)
RBC: 4.6 MIL/uL (ref 3.87–5.11)
RBC: 4.31 MIL/uL (ref 3.87–5.11)
RDW: 15.9 % — ABNORMAL HIGH (ref 11.5–15.5)
RDW: 16.1 % — ABNORMAL HIGH (ref 11.5–15.5)
WBC: 18.3 10*3/uL — ABNORMAL HIGH (ref 4.0–10.5)
WBC: 25.8 10*3/uL — ABNORMAL HIGH (ref 4.0–10.5)
nRBC: 0.7 % — ABNORMAL HIGH (ref 0.0–0.2)
nRBC: 1.2 % — ABNORMAL HIGH (ref 0.0–0.2)

## 2022-08-27 LAB — GLUCOSE, CAPILLARY
Glucose-Capillary: 115 mg/dL — ABNORMAL HIGH (ref 70–99)
Glucose-Capillary: 119 mg/dL — ABNORMAL HIGH (ref 70–99)
Glucose-Capillary: 129 mg/dL — ABNORMAL HIGH (ref 70–99)
Glucose-Capillary: 130 mg/dL — ABNORMAL HIGH (ref 70–99)
Glucose-Capillary: 180 mg/dL — ABNORMAL HIGH (ref 70–99)
Glucose-Capillary: 243 mg/dL — ABNORMAL HIGH (ref 70–99)
Glucose-Capillary: 261 mg/dL — ABNORMAL HIGH (ref 70–99)

## 2022-08-27 LAB — MRSA NEXT GEN BY PCR, NASAL: MRSA by PCR Next Gen: NOT DETECTED

## 2022-08-27 LAB — AMMONIA: Ammonia: 57 umol/L — ABNORMAL HIGH (ref 9–35)

## 2022-08-27 LAB — RENAL FUNCTION PANEL
Albumin: 2.2 g/dL — ABNORMAL LOW (ref 3.5–5.0)
Anion gap: 23 — ABNORMAL HIGH (ref 5–15)
BUN: 23 mg/dL (ref 8–23)
CO2: 18 mmol/L — ABNORMAL LOW (ref 22–32)
Calcium: 10.6 mg/dL — ABNORMAL HIGH (ref 8.9–10.3)
Chloride: 92 mmol/L — ABNORMAL LOW (ref 98–111)
Creatinine, Ser: 3.18 mg/dL — ABNORMAL HIGH (ref 0.44–1.00)
GFR, Estimated: 15 mL/min — ABNORMAL LOW (ref >60)
Glucose, Bld: 273 mg/dL — ABNORMAL HIGH (ref 70–99)
Phosphorus: 5.3 mg/dL — ABNORMAL HIGH (ref 2.5–4.6)
Potassium: 4.4 mmol/L (ref 3.5–5.1)
Sodium: 133 mmol/L — ABNORMAL LOW (ref 135–145)

## 2022-08-27 LAB — HIV ANTIBODY (ROUTINE TESTING W REFLEX): HIV Screen 4th Generation wRfx: NONREACTIVE

## 2022-08-27 LAB — RPR: RPR Ser Ql: NONREACTIVE

## 2022-08-27 LAB — TSH: TSH: 1.5 u[IU]/mL (ref 0.350–4.500)

## 2022-08-27 LAB — VITAMIN B12: Vitamin B-12: 1106 pg/mL — ABNORMAL HIGH (ref 180–914)

## 2022-08-27 LAB — FOLATE: Folate: 13.5 ng/mL (ref >5.9)

## 2022-08-27 MED ORDER — NOREPINEPHRINE 4 MG/250ML-% IV SOLN
2.0000 ug/min | INTRAVENOUS | Status: DC
  Administered 2022-08-27: 10 ug/min via INTRAVENOUS
  Administered 2022-08-27: 9 ug/min via INTRAVENOUS
  Administered 2022-08-28 (×2): 20 ug/min via INTRAVENOUS
  Administered 2022-08-28: 15 ug/min via INTRAVENOUS
  Filled 2022-08-27 (×4): qty 250

## 2022-08-27 MED ORDER — MIDAZOLAM HCL 2 MG/2ML IJ SOLN
INTRAMUSCULAR | Status: AC
  Filled 2022-08-27: qty 2

## 2022-08-27 MED ORDER — ROCURONIUM BROMIDE 10 MG/ML (PF) SYRINGE
PREFILLED_SYRINGE | INTRAVENOUS | Status: AC
  Filled 2022-08-27: qty 10

## 2022-08-27 MED ORDER — ARFORMOTEROL TARTRATE 15 MCG/2ML IN NEBU
15.0000 ug | INHALATION_SOLUTION | Freq: Two times a day (BID) | RESPIRATORY_TRACT | Status: DC
  Administered 2022-08-27 – 2022-08-28 (×2): 15 ug via RESPIRATORY_TRACT
  Filled 2022-08-27 (×2): qty 2

## 2022-08-27 MED ORDER — ORAL CARE MOUTH RINSE
15.0000 mL | OROMUCOSAL | Status: DC
  Administered 2022-08-27 – 2022-08-28 (×10): 15 mL via OROMUCOSAL

## 2022-08-27 MED ORDER — FENTANYL CITRATE PF 50 MCG/ML IJ SOSY
PREFILLED_SYRINGE | INTRAMUSCULAR | Status: AC
  Filled 2022-08-27: qty 2

## 2022-08-27 MED ORDER — SODIUM BICARBONATE 8.4 % IV SOLN
INTRAVENOUS | Status: DC
  Filled 2022-08-27: qty 1000

## 2022-08-27 MED ORDER — SODIUM BICARBONATE 8.4 % IV SOLN: 100.0000 meq | Freq: Once | INTRAVENOUS | Status: AC

## 2022-08-27 MED ORDER — ALBUTEROL SULFATE (2.5 MG/3ML) 0.083% IN NEBU: 2.5000 mg | INHALATION_SOLUTION | Freq: Once | RESPIRATORY_TRACT | Status: DC

## 2022-08-27 MED ORDER — BUDESONIDE 0.5 MG/2ML IN SUSP
0.5000 mg | Freq: Two times a day (BID) | RESPIRATORY_TRACT | Status: DC
  Administered 2022-08-27 – 2022-08-28 (×2): 0.5 mg via RESPIRATORY_TRACT
  Filled 2022-08-27 (×2): qty 2

## 2022-08-27 MED ORDER — SODIUM CHLORIDE 0.9 % IV SOLN: 250.0000 mL | INTRAVENOUS | Status: DC

## 2022-08-27 MED ORDER — SODIUM BICARBONATE 8.4 % IV SOLN
INTRAVENOUS | Status: AC
  Administered 2022-08-27: 100 meq via INTRAVENOUS
  Filled 2022-08-27: qty 100

## 2022-08-27 MED ORDER — ASPIRIN 300 MG RE SUPP
150.0000 mg | Freq: Every day | RECTAL | Status: DC
  Administered 2022-08-27 – 2022-08-28 (×2): 150 mg via RECTAL
  Filled 2022-08-27 (×2): qty 1

## 2022-08-27 MED ORDER — MIDODRINE HCL 5 MG PO TABS
10.0000 mg | ORAL_TABLET | Freq: Three times a day (TID) | ORAL | Status: DC
  Administered 2022-08-27 – 2022-08-28 (×3): 10 mg
  Filled 2022-08-27 (×3): qty 2

## 2022-08-27 MED ORDER — PHENYLEPHRINE 80 MCG/ML (10ML) SYRINGE FOR IV PUSH (FOR BLOOD PRESSURE SUPPORT)
PREFILLED_SYRINGE | INTRAVENOUS | Status: AC
  Administered 2022-08-27: 800 ug
  Filled 2022-08-27: qty 10

## 2022-08-27 MED ORDER — PANTOPRAZOLE SODIUM 40 MG IV SOLR
40.0000 mg | INTRAVENOUS | Status: DC
  Administered 2022-08-27: 40 mg via INTRAVENOUS
  Filled 2022-08-27: qty 10

## 2022-08-27 MED ORDER — REVEFENACIN 175 MCG/3ML IN SOLN
175.0000 ug | Freq: Every day | RESPIRATORY_TRACT | Status: DC
  Administered 2022-08-28: 175 ug via RESPIRATORY_TRACT
  Filled 2022-08-27: qty 3

## 2022-08-27 MED ORDER — ATORVASTATIN CALCIUM 40 MG PO TABS
40.0000 mg | ORAL_TABLET | Freq: Every day | ORAL | Status: DC
  Administered 2022-08-27: 40 mg
  Filled 2022-08-27: qty 1

## 2022-08-27 MED ORDER — ROCURONIUM BROMIDE 10 MG/ML (PF) SYRINGE
PREFILLED_SYRINGE | INTRAVENOUS | Status: AC
  Administered 2022-08-27: 50 mg
  Filled 2022-08-27: qty 10

## 2022-08-27 MED ORDER — PIPERACILLIN-TAZOBACTAM IN DEX 2-0.25 GM/50ML IV SOLN
2.2500 g | Freq: Three times a day (TID) | INTRAVENOUS | Status: DC
  Administered 2022-08-27 – 2022-08-28 (×3): 2.25 g via INTRAVENOUS
  Filled 2022-08-27 (×4): qty 50

## 2022-08-27 MED ORDER — PHENYLEPHRINE HCL-NACL 20-0.9 MG/250ML-% IV SOLN
25.0000 ug/min | INTRAVENOUS | Status: DC
  Administered 2022-08-28: 25 ug/min via INTRAVENOUS
  Filled 2022-08-27: qty 250

## 2022-08-27 MED ORDER — ETOMIDATE 2 MG/ML IV SOLN
INTRAVENOUS | Status: AC
  Administered 2022-08-27: 10 mg
  Filled 2022-08-27: qty 20

## 2022-08-27 MED ORDER — ORAL CARE MOUTH RINSE: 15.0000 mL | OROMUCOSAL | Status: DC | PRN

## 2022-08-27 MED ORDER — LACTATED RINGERS IV BOLUS
1000.0000 mL | Freq: Once | INTRAVENOUS | Status: AC
  Administered 2022-08-27: 1000 mL via INTRAVENOUS

## 2022-08-27 MED ORDER — NOREPINEPHRINE 4 MG/250ML-% IV SOLN
INTRAVENOUS | Status: AC
  Filled 2022-08-27: qty 250

## 2022-08-27 MED ORDER — RENA-VITE PO TABS
1.0000 | ORAL_TABLET | Freq: Every day | ORAL | Status: DC
  Administered 2022-08-27: 1
  Filled 2022-08-27: qty 1

## 2022-08-27 MED ORDER — INSULIN ASPART 100 UNIT/ML IJ SOLN
0.0000 [IU] | INTRAMUSCULAR | Status: DC
  Administered 2022-08-27: 1 [IU] via SUBCUTANEOUS
  Administered 2022-08-28: 2 [IU] via SUBCUTANEOUS
  Administered 2022-08-28: 1 [IU] via SUBCUTANEOUS

## 2022-08-27 NOTE — Progress Notes (Signed)
08/27/2022 0500 Pt more lethargic, HR and respirations have increased.  Not responding when asking if she is in pain.  Messaged Dr. Bridgett Larsson with changes and also let RR nurse know.   Carney [place]

## 2022-08-27 NOTE — Consult Note (Signed)
Neurology Consultation    Reason for Consult:Encephalopathy  CC: Abdominal pain  HISTORY OF PRESENT ILLNESS   HPI  History is obtained from: Chart review  KIERRA JEZEWSKI is a 72 y.o. female with a past medical history of COPD, HTN, HLD, PVD, DM, ESRD, hiatal hernia who initially presented with abdominal pain. On 1/8 she has a CT guided drainage of the right pelvic abscess.  Cultures from this drainage grew E. coli and Klebsiella.  Additionally she had a CT angiogram of the abdominal aorta on 1/15 which showed severe atherosclerotic disease throughout her abdomen and pelvis and bilateral lower extremities.  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  We were consulted to evaluate her for encephalopathy.  She did have a rapid response called on her yesterday while she was at dialysis due to decreased responsiveness.  This improved with Narcan.  Today she was noted to be hypoxic, with an increased white blood cell count, and decreased responsiveness and she was transferred to ICU for intubation.  On my exam at 1315 she is  minimally responsive with oxygen saturation of 82%. She was subsequently placed on a non re-breather with oxygen saturation at 85% and primary team and Dr. Lorrin Goodell were updated.  Rapid response was called.   ROS: Unable to obtain due to altered mental status.   PAST MEDICAL HISTORY    Past Medical History:  Past Medical History:  Diagnosis Date   Abdominal bruit    Anxiety    Arthritis    Osteoarthritis   Asthma    Cervical disc disease    "pinced nerve"   CHF (congestive heart failure) (HCC)    COPD (chronic obstructive pulmonary disease) ([place])    Diabetes mellitus    Type II   Diverticulitis    ESRD (end stage renal disease) ([place])    dialysis - M/W/F- [place]   GERD (gastroesophageal reflux disease)    from medications   GI bleed 03/31/2013   Head injury 07/2017   History of hiatal hernia    Hyperlipidemia    Hypertension    Neuropathy    left leg   Nuclear  sclerotic cataract of right eye 04/23/2020   Osteoporosis    Peripheral vascular disease ([place])    Pneumonia    "very young" and a few years ago   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 Family History  Problem Relation Age of Onset   Other Mother        not sure of cause of death   Diabetes Father    Diabetes Sister    Diabetes Sister    Pancreatic cancer Maternal Grandmother    Colon cancer Neg Hx     Allergies:  Allergies  Allergen Reactions   Adhesive  [Tape] Hives   Lactose Intolerance (Gi) Diarrhea   Lisinopril Cough   Prednisone Swelling and Other (See Comments)    Excessive fluid buildup    Social History:   reports that she quit smoking about  10 years ago. Her smoking use included cigarettes. She has a 14.00 pack-year smoking history. She has never used smokeless tobacco. She reports that she does not drink alcohol and does not use drugs.    Medications Medications Prior to Admission  Medication Sig Dispense Refill   albuterol (VENTOLIN HFA) 108 (90 Base) MCG/ACT inhaler Inhale 2 puffs into the lungs every 4 (four) hours as needed for wheezing or shortness of breath. 1 each 0   amLODipine (NORVASC) 10 MG tablet Take 10 mg by mouth daily.     aspirin EC 81 MG tablet Take 81 mg by mouth daily.     atorvastatin (LIPITOR) 40 MG tablet Take 40 mg by mouth at bedtime.     b complex-vitamin c-folic acid (NEPHRO-VITE) 0.8 MG TABS tablet Take 1 tablet by mouth at bedtime.      busPIRone (BUSPAR) 7.5 MG tablet Take 7.5 mg by mouth daily as needed (anxiety).     carvedilol (COREG) 12.5 MG tablet Take 12.5 mg by mouth See admin instructions. Take 1 tablet (12.5 mg) by mouth once daily in the evening on Mondays, Wednesdays, & Fridays. (Dialysis) Take 1 tablet (12.5 mg) by mouth twice daily on Sundays, Tuesdays, Thursdays, & Saturdays. (Non Dialysis)     cinacalcet (SENSIPAR) 60 MG tablet Take 60 mg by mouth See admin instructions. Take 1 tablet (60 mg) by mouth in the evening on Mondays, Wednesdays, & Fridays (dialysis) Take 1 tablet (60 mg) by mouth twice daily on Sundays, Tuesdays, Thursdays, Saturdays. (non dialysis)     diphenhydrAMINE HCl (BENADRYL ALLERGY PO) Take 1 capsule by mouth every Monday, Wednesday, and Friday with hemodialysis. If needed to help with allergies     [EXPIRED] Doxercalciferol (HECTOROL IV) Doxercalciferol (Hectorol)     ferric citrate (AURYXIA) 1 GM 210 MG(Fe) tablet Take 210-420 mg by mouth See admin instructions. Take 2 tablets (420 mg) by mouth with each meal & take 1 tablet (210 mg) by mouth with snacks.     fluticasone (FLONASE) 50 MCG/ACT nasal spray Place 2 sprays into both nostrils daily as needed for allergies or rhinitis. 16 g 5   gabapentin (NEURONTIN) 100 MG capsule TAKE 1 CAPSULE (100 MG TOTAL) BY MOUTH AT BEDTIME. WHEN NECESSARY FOR NEUROPATHY PAIN (Patient taking differently: Take 100 mg by mouth at bedtime as needed (When necessary for neuropathy pain).) 30 capsule 3   glipiZIDE (GLUCOTROL) 5 MG tablet Take 5 mg by mouth at bedtime.     isosorbide-hydrALAZINE (BIDIL) 20-37.5 MG tablet Take 1 tablet by mouth See admin instructions. hs on Monday,Wednesday,Friday (dialysis days) Tid on Tuesday,Thursday,Saturday,Sunday(non-dialysis days) (Patient taking differently: Take 1 tablet by mouth See admin instructions. Take in the evening on Monday,Wednesday,Friday (dialysis days) BID on Tuesday,Thursday,Saturday,Sunday(non-dialysis days)) 270  tablet 3   lidocaine-prilocaine (EMLA) cream Apply 1 Application topically as needed. 30 g 3   loperamide (IMODIUM) 2 MG capsule Take 2 capsules (4 mg total) by mouth 3 (three) times daily as needed for diarrhea or loose stools. 30 capsule 0   Methoxy PEG-Epoetin Beta (MIRCERA IJ) Mircera     montelukast (SINGULAIR) 10 MG tablet TAKE 1 TABLET BY MOUTH EVERYDAY AT BEDTIME (Patient taking differently: Take 10 mg by mouth at bedtime.) 30 tablet 5   omeprazole (PRILOSEC) 20 MG capsule Take 20 mg by mouth daily as needed (for acid reflux).     ondansetron (ZOFRAN) 4 MG tablet Take 1 tablet (4 mg total) by mouth every 6 (six) hours as needed for nausea. 20 tablet  0   promethazine-dextromethorphan (PROMETHAZINE-DM) 6.25-15 MG/5ML syrup Take 5 mLs by mouth 4 (four) times daily as needed for cough. 118 mL 0   traZODone (DESYREL) 50 MG tablet Take 25-50 mg by mouth at bedtime as needed for sleep.     ursodiol (ACTIGALL) 250 MG tablet Take 250 mg by mouth 2 (two) times daily.     budesonide-formoterol (SYMBICORT) 160-4.5 MCG/ACT inhaler Inhale 2 puffs into the lungs 2 (two) times daily. (Patient not taking: Reported on 05/28/2022) 1 each 5   EPINEPHrine (AUVI-Q) 0.3 mg/0.3 mL IJ SOAJ injection Inject 0.3 mg into the muscle as needed for anaphylaxis. (Patient not taking: Reported on 08/03/2022) 1 each 1   fluticasone-salmeterol (ADVAIR HFA) 115-21 MCG/ACT inhaler Inhale two puffs twice daily (Patient not taking: Reported on 05/28/2022) 1 each 5   Spacer/Aero-Holding Chambers (AEROCHAMBER PLUS) inhaler Use with inhaler 1 each 2    EXAMINATION    Current vital signs:    08/27/2022   12:00 PM 08/27/2022   11:47 AM 08/27/2022   11:08 AM  Vitals with BMI  Systolic 009  381  Diastolic 51  53  Pulse 829 103     Examination:  GENERAL: Elderly, ill-appearing HEENT: - Normocephalic and atraumatic, dry mm, no lymphadenopathy, no Thyromegally LUNGS -coarse right worse than left.  Tachypneic, saturation 84% on a  nonrebreather CV - S1S2 RRR, equal pulses bilaterally. ABDOMEN - Soft, nontender, nondistended with normoactive BS Ext: Left AKA, extremities are cool  NEURO:  Mental Status: AA&Ox3  Language: speech is clear.  Intact naming, repetition, fluency, and comprehension. Cranial Nerves:  II, III, IV, VI: Pupils are slow to react, consistent blink to threat, eyes are midline VII: Right facial asymmetry IX, X: Weak cough and gag XI: Head is midline Motor:  RUE: 2/5      LUE: 2/5 RLE: 2/5        LLE AKA  Tone: is normal, no rigidity in upper extremities  Sensation-grimaces to painful stimuli Coordination: Unable to perform Gait- deferred   LABS   I have reviewed labs in epic and the results pertinent to this consultation are:  Lab Results  Component Value Date   LDLCALC 107 (H) 01/04/2020   Lab Results  Component Value Date   ALT 10 08/05/2022   AST 15 08/05/2022   ALKPHOS 73 08/05/2022   BILITOT 0.5 08/05/2022   Lab Results  Component Value Date   HGBA1C 6.2 (H) 08/03/2022   Lab Results  Component Value Date   WBC 18.3 (H) 08/27/2022   HGB 14.4 08/27/2022   HCT 45.0 08/27/2022   MCV 97.8 08/27/2022   PLT 176 08/27/2022   Lab Results  Component Value Date   VITAMINB12 421 08/03/2022   Lab Results  Component Value Date   FOLATE 11.2 08/03/2022   Lab Results  Component Value Date   NA 133 (L) 08/27/2022   K 4.4 08/27/2022   CL 92 (L) 08/27/2022   CO2 18 (L) 08/27/2022     DIAGNOSTIC IMAGING/PROCEDURES   I have reviewed the images obtained:, as below    CT-head 1. No acute intracranial abnormality. 2. Unchanged chronic small-vessel disease.  MRI brain pending   EEG pending  ASSESSMENT/PLAN    Assessment:  72 y.o. female with a past medical history of COPD, HTN, HLD, PVD, DM, ESRD, hiatal hernia who initially presented with abdominal pain. On 1/8 she has a CT guided drainage of the right pelvic abscess.  Cultures from this drainage grew E. coli  and Klebsiella.  Additionally she had a CT angiogram of the abdominal aorta on 1/15 which showed severe atherosclerotic disease throughout her abdomen and pelvis and bilateral lower extremities.  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  We were consulted to evaluate her for encephalopathy.  She did have a rapid response called on her yesterday while she was at dialysis due to decreased responsiveness. On my exam at 1317 she is unresponsive with minimal response to noxious stimuli and oxygen saturation of 82%. She was subsequently placed on a non re-breather with oxygen saturation at 85% and primary team and Dr. Lorrin Goodell were updated.  Rapid response was called. Of note WBC is increased to 18.3.   Impression: Encephalopathy in the setting of sepsis and hypoxia  Concern for aspiration   Recommendations: - Delirium precautions  - B12, folate, TSH, Ammonia, RPR. - management of hypoxic respiratory failure per Primary team.  Neurology will follow up on imaging results. Please call with any questions    -- Patient seen and examined by NP/APP with MD. MD to update note as needed.   Janine Ores, DNP, FNP-BC [facility] Pager: [phone]   **This documentation was dictated using [place] and may contain inadvertent errors **  NEUROHOSPITALIST ADDENDUM Performed a face to face diagnostic evaluation.   I have reviewed the contents of history and physical exam as documented by PA/ARNP/Resident and agree with above documentation.  I have discussed and formulated the above plan as documented. Edits to the note have been made as needed.  Impression/Key exam findings/Plan: Ms. DENYSE FILLION has had a prolonged hosptial course complicated by perforated appendicitis, left above knee amputation on 1/29. We were asked to evaluate her for neurological causes of AMS. Per discussion with Dr. Candiss Norse, she developed waxing and waning encephalopathy last night. Today she has  elevated WBC count, poor responsiveness, tachycardia,   gurgling and muffled breath sounds. Her symptoms are most concerning for aspiration penumonia rather than an acute neurological process.  No hx of seizures. Hard to examine her given poor mentation. CT Head is stable. She is not safe to go for MRI Brain given the degree of her hypoxia and satting in 80s while on 10+ liters of oxygen. Routine EEG is obtained and pending. No history of seizure and would be very rare to have a first time seizure in this age group.  I think that the overall presentation is more concerning for aspiration pneumonia with respiratory failure and near respiratory arrest rather than a seizure or other neurologic event.  We will follow up on the studies above but my overall suspicion is that neurologic decline is secondary to respiratory decline and hypoxia.  Donnetta Simpers, MD [facility] [phone]   If 7pm to 7am, please call on call as listed on AMION.

## 2022-08-27 NOTE — Progress Notes (Signed)
MB arrived to room and was setting up equipment for EEG when Neurology arrived to access patient and a potential Code status. Neurology Dr. And NP requested to cancel EEG for now and called floor following Dr. To discuss further patient care.

## 2022-08-27 NOTE — Procedures (Signed)
Intubation Procedure Note  Destiny Day  [id number]  [birth year]  Date:08/27/22  Time:3:00 PM   Provider Performing:Moniqua Engebretsen    Procedure: Intubation (31500)  Indication(s) Respiratory Failure  Consent Risks of the procedure as well as the alternatives and risks of each were explained to the patient and/or caregiver.  Consent for the procedure was obtained and is signed in the bedside chart   Anesthesia Etomidate and Rocuronium   Time Out Verified patient identification, verified procedure, site/side was marked, verified correct patient position, special equipment/implants available, medications/allergies/relevant history reviewed, required imaging and test results available.   Sterile Technique Usual hand hygeine, masks, and gloves were used   Procedure Description Patient positioned in bed supine.  Sedation given as noted above.  Patient was intubated with endotracheal tube using Glidescope.  View was Grade 1 full glottis .  Number of attempts was 1.  Colorimetric CO2 detector was consistent with tracheal placement.   Complications/Tolerance None; patient tolerated the procedure well. Chest X-ray is ordered to verify placement.   EBL Minimal   Specimen(s) None

## 2022-08-27 NOTE — Procedures (Signed)
Bronchoscopy Procedure Note  JAYLEANA COLBERG  [id number]  Oct 20, 1950  Date:08/27/22  Time:4:45 PM   Provider Performing:Delray Reza   Procedure(s):  Flexible bronchoscopy with bronchial alveolar lavage (95284) and Initial Therapeutic Aspiration of Tracheobronchial Tree (13244)  Indication(s) Acute respiratory failure  Consent Risks of the procedure as well as the alternatives and risks of each were explained to the patient and/or caregiver.  Consent for the procedure was obtained and is signed in the bedside chart  Anesthesia Etomidate and Rocuronium   Time Out Verified patient identification, verified procedure, site/side was marked, verified correct patient position, special equipment/implants available, medications/allergies/relevant history reviewed, required imaging and test results available.   Sterile Technique Usual hand hygiene, masks, gowns, and gloves were used   Procedure Description Bronchoscope advanced through endotracheal tube and into airway.  Airways were examined down to subsegmental level with findings noted below.   Following diagnostic evaluation, BAL(s) performed in RLL with normal saline and return of Greyish colored fluid and Therapeutic aspiration performed in RLL/RML/LLL  Findings: Copious amounts of tenacious secretion noted through out respiratory tree, Also food particle noted in RLL.   Complications/Tolerance None; patient tolerated the procedure well. Chest X-ray is not needed post procedure.   EBL Minimal   Specimen(s) BAL

## 2022-08-27 NOTE — Progress Notes (Signed)
PT Cancellation Note  Patient Details Name: Destiny Day MRN: [id number] DOB: October 02, 1950   Cancelled Treatment:    Reason Eval/Treat Not Completed: Medical issues which prohibited therapy. Pt transferring off unit to ICU upon arrival of PT, now intubated.    Luvenia Heller 08/27/2022, 3:31 PM

## 2022-08-27 NOTE — Progress Notes (Signed)
RT unsuccessful at getting ABG. RN aware.

## 2022-08-27 NOTE — Progress Notes (Signed)
eLink Physician-Brief Progress Note Patient Name: Destiny Day DOB: Feb 20, 1951 MRN: [id number]   Date of Service  08/27/2022  HPI/Events of Note  RT unable to obtain a blood gas.  eICU Interventions  Bicarb gtt started empirically at 100 ml / hour.        Frederik Pear 08/27/2022, 11:36 PM

## 2022-08-27 NOTE — Progress Notes (Addendum)
PROGRESS NOTE        PATIENT DETAILS Name: Destiny Day Age: 72 y.o. Sex: female Date of Birth: 1951-04-21 Admit Date: 08/16/2022 Admitting Physician Lorrin Goodell, MD EGB:TDVVO, Rachel Moulds, NP  Brief Summary: Patient is a 72 y.o.  female with history of ESRD on HD MWF, chronic HFpEF, PAD-s/p bilateral TMA who presented with RLQ pain-found to have perforated appendicitis with abscess.  Hospital course complicated by worsening left lower extremity rest pain due to critical limb ischemia.  See below for further details.  Significant events: 1/07>> admitted TRH-RLQ pain-subsequently found to have perforated appendicitis with abscess.  Significant studies: 1/07>> RUQ ultrasound: Cholelithiasis with positive sonographic Murphy sign. 1/07>> CT abdomen/pelvis: Findings suspicious for perforated appendicitis with periappendiceal abscess 1/11>> CT abdomen/pelvis: Interval decrease in size of the right pelvic abscess-s/p percutaneous drainage. 1/15>> HIDA scan: No evidence of acute cholecystitis. 1/15>> CT angio abdominal aorta: Severe atherosclerotic disease throughout abdomen/pelvis and bilateral lower extremities.  Severe left outflow disease with occlusion of left femoral-popliteal bypass graft, occlusion of native left SFA.  Complex collection in the right hemipelvis-enlarged since 1/11 study. 1/22>> CT head:No acute abnormality 1/24>> CT abdomen/pelvis: Drainage catheter in place-in the region of complex abnormality in the right pelvic sidewall, residual component 4.9 x 5.3 x 4.7 cm.  No clinical worsening.  Significant microbiology data: 1/08>> appendix abscess culture: E. 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 Donzetta Matters vascular surgery  Consults: IR ID Vascular surgery General surgery Nephrology Pall Care  Subjective: Patient in bed awake denies any headache,  overall appears lethargic as compared to before   Objective: Vitals: Blood pressure (!) 132/45, pulse 96, temperature 98.1 F (36.7 C), temperature source Oral, resp. rate (!) 33, height '5\' 5"'$  (1.651 m), weight 40.3 kg, SpO2 91 %.   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   Leukocytosis now slowly downtrending Repeat CT-with some residual collection but no new findings. ID switched antibiotics-current recommendations are for cefazolin (given with HD) and Flagyl-end date 09/17/22.  Needs weekly CBC/CMP/CRP.  ID follow-up arranged for 2/20. General surgery continues to recommend supportive care.    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  Palliative care has also been consulted for goals of care discussion.   ESRD on HD MWF - Nephrology following and directing care  Acute metabolic encephalopathy Hospital delirium In the setting of acute illness/appendix abscess, underlying delirium and encephalopathy  had improved.   CT head done on 08/17/2022 was unremarkable.  She has developed some narcotic induced toxic encephalopathy on 08/26/2022, improved after Narcan, narcotic stopped, was also on Neurontin which has been stopped.  Mentation initially improved on 08/26/2022 however throughout the night according to the husband she had episodes where she will get better and then get worse again, on 08/27/2022 she remains awake but overall lethargic, head CT repeated on 08/27/2022 nonacute.  Will go ahead and obtain MRI brain, ABG, ammonia level, EEG and neuro input.  Minimize narcotics and benzodiazepines.  NG tube for feeding.  Was paged at 1:30 PM that patient has abruptly become more short of breath, hypoxic and even less responsive, in the meantime morning ABG appears stable except for minimal hypoxia patient has reduced breath sounds on the right side on exam, suction revealed lot of oral secretions and soft food material, she likely has aspirated, unable to maintain her airway, discussed with husband in detail for now he would like her to get intubated and have full scope of care and full code.  Will call pulmonary critical care and transferred to ICU for intubation.  She clearly cannot maintain her airway and is aspirating.  Husband will discuss further goals of care with family.    HFrEF Euvolemic Volume removal with HD  HLD Continue statin  HTN Pressure running low will adjust blood pressure medications on 08/22/2022.  Neuropathic pain Rest  pain associated PAD Neurontin  Anxiety Continue with BuSpar as needed  Asthma COPD overlap No wheezing Continue bronchodilators  Complex right ovarian mass Outpatient GYN follow-up-patient/spouse aware of this recommendation-potential malignancy  Left upper pole renal lesion Concern for malignancy-needs outpatient follow-up with urology for further workup  Debility/deconditioning PT/OT eval Home health recommended  DM-2 (A1c 6.2 on 1/8) CBGs stable with  SSI  Recent Labs    08/26/22 2133 08/27/22 0500 08/27/22 0819  GLUCAP 311* 261* 243*    Nutrition Status: Nutrition Problem: Moderate Malnutrition Etiology: chronic illness Signs/Symptoms: severe muscle depletion, moderate fat depletion Interventions: Ensure Enlive (each supplement provides 350kcal and 20 grams of protein), MVI, Magic cup, Prostat  BMI: Estimated body mass index is 14.78 kg/m as calculated from the following:   Height as of this encounter: '5\' 5"'$  (1.651 m).   Weight as of this encounter: 40.3 kg.   Code status:   Code Status: Full Code   DVT Prophylaxis: heparin injection 5,000 Units Start: 08/27/22 0600 SCDs Start: 07/27/2022 2213 IV heparin   Family Communication: Spouse [name] updated 08/24/25, updated 08/27/2022  Disposition Plan: Status is: Inpatient Remains inpatient appropriate because: Severity of illness.   Planned Discharge Destination:Home health   Diet: Diet Order             DIET - DYS 1 Room service appropriate? Yes; Fluid consistency: Thin  Diet effective now                    MEDICATIONS: Scheduled Meds:  (feeding supplement) PROSource Plus  30 mL Oral BID BM   acidophilus  1 capsule Oral Daily   aspirin  150 mg Rectal Daily   atorvastatin  40 mg Oral QHS   carvedilol  3.125 mg Oral BID WC   Chlorhexidine Gluconate Cloth  6 each Topical Q0600   feeding supplement  237 mL Oral BID BM   feeding supplement (NEPRO CARB STEADY)  237 mL Oral BID BM   ferric citrate  420 mg Oral TID WC   fluticasone furoate-vilanterol  1 puff Inhalation Daily   Gerhardt's butt cream   Topical TID   heparin  5,000 Units Subcutaneous Q8H   insulin aspart  0-6 Units Subcutaneous TID WC   isosorbide-hydrALAZINE  1 tablet Oral BID   metroNIDAZOLE  500 mg Oral Q12H   midodrine  10 mg Oral TID WC   multivitamin  1 tablet Oral QHS   nystatin   Topical TID   pantoprazole  40 mg Oral BID   ursodiol  300 mg Oral BID   Continuous  Infusions:   ceFAZolin (ANCEF) IV 2 g (08/26/22 1717)   PRN Meds:.albuterol, fluticasone, hydrALAZINE, naLOXone (NARCAN)  injection, ondansetron (ZOFRAN) IV, ondansetron   I have personally reviewed following labs and imaging studies  LABORATORY DATA:  Recent Labs  Lab 08/13/2022 0527 08/15/2022 0720 08/16/2022 0855 08/25/22 0609 08/26/22 0709 08/27/22 0319  WBC 16.2* 13.9*  --  15.4* 13.7* 18.3*  HGB 11.0* 11.5* 11.6* 11.9* 11.6* 14.4  HCT 34.1* 35.5* 34.0* 38.5 35.2* 45.0  PLT 199 191  --  193 210 176  MCV 97.7 98.9  --  100.3* 97.0 97.8  MCH 31.5 32.0  --  31.0 32.0 31.3  MCHC 32.3 32.4  --  30.9 33.0 32.0  RDW 15.4 15.4  --  15.4 15.4 15.9*    Recent Labs  Lab 08/22/22 0307 08/21/2022 0720 08/12/2022 0855 08/27/22 0319  NA 135 135 135 133*  K 3.1* 3.8 3.4* 4.4  CL 94* 95* 96* 92*  CO2 25 20*  --  18*  ANIONGAP 16* 20*  --  23*  GLUCOSE 178* 146* 152* 273*  BUN 9 29* 29* 23  CREATININE 2.95* 6.28* 6.60* 3.18*  ALBUMIN 2.2* 2.1*  --  2.2*  CALCIUM 10.6* 10.9*  --  10.6*     LOS: 25 days   Signature  -    Lala Lund M.D on 08/27/2022 at 11:13 AM   -  To page go to [email or web address]

## 2022-08-27 NOTE — Progress Notes (Signed)
Consult was place for US guided PIV. Pt has US guided PIV in place and vasopressor in currently running at this site. Verified by unit RN. Unit RN to place consult if further access is needed.

## 2022-08-27 NOTE — Progress Notes (Signed)
[facility] in room to see pt. O2 reading low 80s. RN came into room to assess. NRB has been placed on pt without improvement. Pt not following commands and arouses slightly to pain. Rapid response called. Dr. Candiss Norse notified. Pt to be transferred to ICU for airway protection.   1409 Report given to receiving ICU RN. Pt transferred to ICU with charge RN and rapid response team. Belongings with pt.

## 2022-08-27 NOTE — Progress Notes (Signed)
Pharmacy Antibiotic Note  Destiny Day is a 72 y.o. female admitted on 08/17/2022 with intra-abdominal infection, perforated appendix with periappendicular abscess. She is currently on   Cefazolin and Flagyl for intraabdominal infection with E coli, Kleb PNA, Bacteroides, with targeted end date of 09/17/22.  Today  CCM is switching antibiotics to Zosyn.   Pharmacy has been consulted for Zosyn dosing for sepsis due aspiration pneumonia.  Plan: Zosyn 2.25 g IV q8h Monitor clinical status and culture results daily.    Height: '5\' 5"'$  (165.1 cm) Weight: 40.3 kg (88 lb 13.5 oz) IBW/kg (Calculated) : 57  Temp (24hrs), Avg:98.5 F (36.9 C), Min:98.1 F (36.7 C), Max:99.3 F (37.4 C)  Recent Labs  Lab 08/22/22 0307 08/23/22 0247 08/06/2022 0527 08/01/2022 0720 08/25/2022 0855 08/25/22 0609 08/26/22 0709 08/27/22 0319  WBC 23.1*   < > 16.2* 13.9*  --  15.4* 13.7* 18.3*  CREATININE 2.95*  --   --  6.28* 6.60*  --   --  3.18*   < > = values in this interval not displayed.    Estimated Creatinine Clearance: 10.3 mL/min (A) (by C-G formula based on SCr of 3.18 mg/dL (H)).    Allergies  Allergen Reactions   Adhesive  [Tape] Hives   Lactose Intolerance (Gi) Diarrhea   Lisinopril Cough   Prednisone Swelling and Other (See Comments)    Excessive fluid buildup    Antimicrobials this admission: 1/7 zosyn>>1/15 Ceftriaxone 1/15 > 1/25 Flagyl 1/15 >   2/1 Cefazolin 1/25 >   2/1 Zosyn 2/1>>   Dose adjustments this admission:   Microbiology results: 1/8 wound cx: E. Coli (amp R, Unasyn R); Kleb pneumo (Amp R); bacteroides thetaiotaomicron >> Tx per ID     Thank you for allowing pharmacy to be a part of this patient's care. Nicole Cella, RPh Clinical Pharmacist  08/27/2022 3:32 PM

## 2022-08-27 NOTE — Consult Note (Signed)
NAME:  Destiny Day, MRN:  [id number], DOB:  11/10/1950, LOS: 35 ADMISSION DATE:  08/23/2022, CONSULTATION DATE: 2/1 REFERRING MD: Dr. Candiss Norse, CHIEF COMPLAINT: Altered mental status, respiratory depression  History of Present Illness:  72 year old female who presented to [facility] hospital on 1/7 with reports of right lower quadrant abdominal pain.  She was admitted to [facility] for further evaluation.  Initial workup notable for RUQ ultrasound with cholelithiasis and positive sonographic Murphy sign.  Subsequent CT of the abdomen pelvis showed findings suspicious for perforated appendicitis with periappendiceal abscess.  On 1/8 she underwent a CT-guided drainage of the right pelvic abscess.  Cultures from appendix drainage grew E. coli and Klebsiella.  Subsequent CT imaging of abdomen/pelvis on 1/11 showed an interval decrease in size of the right pelvic abscess after percutaneous drainage.  She underwent HIDA scan on 1/15 with no evidence of acute cholecystitis.  CT angio of the abdominal aorta on 1/15 showed severe atherosclerotic disease throughout the abdomen and pelvis and bilateral lower extremities.  Severe left outflow disease with occlusion of the left femoral-popliteal bypass graft, occlusion of native left SFA.  Complex collection in the right hemipelvis and large since 1/11 study.  1/17 interventional radiology performed a drain check to ensure patency of abscess drain.  CTA imaging findings were evaluated by vascular surgery.  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  Postoperative amputation course complicated by oversedation, metabolic encephalopathy and soft blood pressures.  She has been followed by infectious disease with recommendation for cefazolin to be given with HD and Flagyl with an end date of 09/17/2022.  On 2/1, the patient developed worsening altered mental status with agonal respirations.  PCCM urgently consulted for transfer to  ICU for intubation.  Husband requested full scope care prior to intubation in conversation with Dr. Candiss Norse.  Labs 2/1-NA 133, K4.4, CL 92, CO2 18, glucose 273, BUN 23, CR 3.18, WBC 18.3, Hgb 14.4, and platelets 176.  CXR post intubation with ETT, NG tube in good position.  New right lower lobe infiltrate concerning for aspiration and left lower lobe.   Pertinent  Medical History  Anxiety  COPD  HTN  HLD PVD DM  ESRD-on HD MWF Hiatal Hernia   Significant Hospital Events: Including procedures, antibiotic start and stop dates in addition to other pertinent events   1/7 Admit with perforated appendix 1/8 CT-guided drainage of right pelvic abscess 1/15 CT angio of the abdominal aorta with severe atherosclerotic disease throughout abdomen and pelvis & bilateral lower extremities.  Severe left outflow disease with occlusion of left femoral-popliteal bypass graft, occlusion of native left SFA.  1/17 Drain check / injection per IR, noted appendiceal fistula  1/22 CT head negative 1/24 CT abdomen pelvis with drainage catheter in place in the region of complex abnormality in the right pelvic sidewall 1/29 left AKA per Dr. Donzetta Matters VVS 2/1 Tx to ICU, emergent intubation in setting of AMS and suspected aspiration.  Near respiratory arrest.  Interim History / Subjective:  Called for emergent transfer to ICU for altered mental status and agonal respirations  Objective   Blood pressure (!) 110/56, pulse (!) 107, temperature 99.3 F (37.4 C), temperature source Axillary, resp. rate 14, height '5\' 5"'$  (1.651 m), weight 40.3 kg, SpO2 (!) 86 %.        Intake/Output Summary (Last 24 hours) at 08/27/2022 1429 Last data filed at 08/26/2022 2115 Gross per 24 hour  Intake 0 ml  Output 0 ml  Net 0 ml   Filed Weights   08/03/2022 1907 08/26/22 0500 08/27/22 0500  Weight: 49.8 kg 51 kg 40.3 kg    Examination: General: Cachectic elderly female lying in bed HENT: MM pink/dry, ETT Lungs: Agonal respirations  prior to intubation, post intubation with coarse rhonchi R>L Cardiovascular: S1-S2 RRR, questionable SEM difficult to discern with of adventitious breath sounds Abdomen: Soft, nondistended, right lower quadrant drainage catheter with yellow purulent drainage in tubing and bag Extremities: Muscle wasting, left AKA with dressing in place Neuro: Obtunded, no spontaneous movements  Resolved Hospital Problem list     Assessment & Plan:   RLQ Pain with Perforated Appendicitis with Perippendiceal Abscess s/p CT Guided Drain Klebsiella and E-Coli from Abscess.  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  -appreciate ID, IR, CCS assistance with patient care  -continue abx as outlined per ID > cefazolin to be given with HD and Flagyl through 09/17/2022, see below -Ensure weekly CBC, CMP, CRP -ID follow-up arranged for 2/20 -follow fever curve / WBC trend   Acute Hypoxemic Respiratory Failure with RLL Aspiration  Possible aspiration on left as well.   -expand abx to zosyn for now. Can transition back to above once feel patient has been treated for acute issues  -now PRVC with LTVV -wean PEEP / FiO2 for sats >90% -now CXR and in am -ABG in one hour  Critical Limb Ischemia s/p L AKA Hx of Left Femoral-Popliteal Bypass Graft, CTA with occluded L Fem to below Knee Bypass this admission.  PVD -ASA PR  -post operative care per VVS  Hypotension  -1L LR now  -levophed for MAP >65 -midodrine 10 mg TID   Acute Metabolic Encephalopathy  In setting of aspiration, hypoxia. CBG normal on arrival to ICU.  -minimize all sedation to allow for neuro exam  -Narcotics stopped 1/31 as well as neurontin  -appreciate Neurology input > pending MRI brain, ammonia, EEG   ESRD on HD  MWF  -Trend BMP / urinary output -Replace electrolytes as indicated -appreciate Nephrology, last HD 1/31   HTN, HLD  HFrEF -dry to euvolemic on exam, 1L LR now  -hold bidil,  coreg  -continue statin  Complex Right Ovarian Mass  L Upper Pole Renal Lesion  -outpatient GYN & Urology follow up needed, family aware of issue & possibility of malignancy   DM II with Diabetic Retinopathy  -assess CBG now  -SSI Q4, very sensitive scale   Asthma / COPD Overlap  -add brovana, pulmicort, yupelri  -PRN albuterol  -monitor for bronchospasm   Anxiety  -hold buspar -supportive care   Adult Failure to Thrive  -critically ill, concern for poor prognosis   Severe Protein Calorie Malnutrition  -begin TF, monitor for re-feeding syndrome  Best Practice (right click and "Reselect all SmartList Selections" daily)  Diet/type: NPO DVT prophylaxis: prophylactic heparin  GI prophylaxis: PPI Lines: N/A Foley:  N/A Code Status:  DNR Last date of multidisciplinary goals of care discussion: 2/1 per Dr. Tacy Learn, see IPAL note  Labs   CBC: Recent Labs  Lab 08/21/2022 0527 08/01/2022 0720 08/26/2022 0855 08/25/22 0609 08/26/22 0709 08/27/22 0319  WBC 16.2* 13.9*  --  15.4* 13.7* 18.3*  HGB 11.0* 11.5* 11.6* 11.9* 11.6* 14.4  HCT 34.1* 35.5* 34.0* 38.5 35.2* 45.0  MCV 97.7 98.9  --  100.3* 97.0 97.8  PLT 199 191  --  193 210 176    Basic  Metabolic Panel: Recent Labs  Lab 08/22/22 0307 08/09/2022 0720 08/18/2022 0855 08/27/22 0319  NA 135 135 135 133*  K 3.1* 3.8 3.4* 4.4  CL 94* 95* 96* 92*  CO2 25 20*  --  18*  GLUCOSE 178* 146* 152* 273*  BUN 9 29* 29* 23  CREATININE 2.95* 6.28* 6.60* 3.18*  CALCIUM 10.6* 10.9*  --  10.6*  PHOS 3.6 4.5  --  5.3*   GFR: Estimated Creatinine Clearance: 10.3 mL/min (A) (by C-G formula based on SCr of 3.18 mg/dL (H)). Recent Labs  Lab 08/01/2022 0720 08/25/22 0609 08/26/22 0709 08/27/22 0319  WBC 13.9* 15.4* 13.7* 18.3*    Liver Function Tests: Recent Labs  Lab 08/22/22 0307 08/15/2022 0720 08/27/22 0319  ALBUMIN 2.2* 2.1* 2.2*   No results for input(s): "LIPASE", "AMYLASE" in the last 168 hours. No results for  input(s): "AMMONIA" in the last 168 hours.  ABG    Component Value Date/Time   PHART 7.41 08/27/2022 1110   PCO2ART 34 08/27/2022 1110   PO2ART 74 (L) 08/27/2022 1110   HCO3 21.6 08/27/2022 1110   TCO2 26 08/09/2022 0855   ACIDBASEDEF 2.4 (H) 08/27/2022 1110   O2SAT 96.1 08/27/2022 1110     Coagulation Profile: No results for input(s): "INR", "PROTIME" in the last 168 hours.  Cardiac Enzymes: Recent Labs  Lab 08/23/22 0900  CKTOTAL 275*    HbA1C: Hgb A1c MFr Bld  Date/Time Value Ref Range Status  08/03/2022 12:08 AM 6.2 (H) 4.8 - 5.6 % Final    Comment:    (NOTE)         Prediabetes: 5.7 - 6.4         Diabetes: >6.4         Glycemic control for adults with diabetes: <7.0   08/04/2021 03:46 AM 6.4 (H) 4.8 - 5.6 % Final    Comment:    (NOTE) Pre diabetes:          5.7%-6.4%  Diabetes:              >6.4%  Glycemic control for   <7.0% adults with diabetes     CBG: Recent Labs  Lab 08/26/22 2133 08/27/22 0500 08/27/22 0819 08/27/22 1215 08/27/22 1345  GLUCAP 311* 261* 243* 130* 129*    Review of Systems:   Unable to complete with patient due to AMS.   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   Surgical History:   Past  Surgical History:  Procedure Laterality Date   A/V SHUNTOGRAM N/A 09/22/2016   Procedure: A/V Shuntogram - left arm;  Surgeon: Serafina Mitchell, MD;  Location: [place] [facility];  Service: Cardiovascular;  Laterality: N/A;   A/V SHUNTOGRAM N/A 03/22/2018   Procedure: A/V SHUNTOGRAM - left arm;  Surgeon: Serafina Mitchell, MD;  Location: [place] [facility];  Service: Cardiovascular;  Laterality: N/A;   A/V SHUNTOGRAM Left 11/17/2018   Procedure: A/V SHUNTOGRAM;  Surgeon: [name]  S, MD;  Location: [place] [facility];  Service: Cardiovascular;  Laterality: Left;   ABDOMINAL HYSTERECTOMY  1993`   AMPUTATION Left 09/01/2016   Procedure: LEFT FOOT TRANSMETATARSAL AMPUTATION;  Surgeon: Newt Minion, MD;  Location: [place];  Service: Orthopedics;  Laterality: Left;   AMPUTATION Right 08/11/2017   Procedure: RIGHT GREAT TOE AMPUTATION DIGIT;  Surgeon: Rosetta Posner, MD;  Location: [place];  Service: Vascular;  Laterality: Right;   AMPUTATION Right 12/31/2017   Procedure: RIGHT TRANSMETATARSAL AMPUTATION;  Surgeon: Newt Minion, MD;  Location: [place];  Service: Orthopedics;  Laterality: Right;   AMPUTATION Left 08/14/2022   Procedure: LEFT AMPUTATION ABOVE KNEE;  Surgeon: Angelia Mould, MD;  Location: [place];  Service: Vascular;  Laterality: Left;   AV FISTULA PLACEMENT Left 04/21/2016   Procedure: INSERTION OF ARTERIOVENOUS (AV) GORE-TEX GRAFT ARM LEFT;  Surgeon: Elam Dutch, MD;  Location: [place];  Service: Vascular;  Laterality: Left;   BASCILIC VEIN TRANSPOSITION Left 07/10/2014   Procedure: BASCILIC VEIN TRANSPOSITION;  Surgeon: Angelia Mould, MD;  Location: [place];  Service: Vascular;  Laterality: Left;   [place] Right 11/08/2014   Procedure: FIRST STAGE BASILIC VEIN TRANSPOSITION;  Surgeon: Angelia Mould, MD;  Location: [place];  Service: Vascular;  Laterality: Right;   [place] Right 01/18/2015   Procedure: SECOND STAGE BASILIC VEIN  TRANSPOSITION;  Surgeon: Angelia Mould, MD;  Location: [place];  Service: Vascular;  Laterality: Right;   CRANIOTOMY N/A 08/23/2017   Procedure: CRANIOTOMY HEMATOMA EVACUATION SUBDURAL;  Surgeon: Ashok Pall, MD;  Location: [place];  Service: Neurosurgery;  Laterality: N/A;   CRANIOTOMY Left 08/24/2017   Procedure: CRANIOTOMY FOR RECURRENT ACUTE SUBDURAL HEMATOMA;  Surgeon: Ashok Pall, MD;  Location: [place];  Service: Neurosurgery;  Laterality: Left;   ESOPHAGOGASTRODUODENOSCOPY N/A 03/31/2013   Procedure: ESOPHAGOGASTRODUODENOSCOPY (EGD);  Surgeon: Gatha Mayer, MD;  Location: [facility] ENDOSCOPY;  Service: Endoscopy;  Laterality: N/A;   EYE SURGERY     laser surgery   FEMORAL-POPLITEAL BYPASS GRAFT Left 07/08/2016   Procedure: LEFT  FEMORAL-BELOW KNEE POPLITEAL ARTERY BYPASS GRAFT USING 6MM X 80 CM PROPATEN GORETEX GRAFT WITH RINGS.;  Surgeon: Rosetta Posner, MD;  Location: [facility] OR;  Service: Vascular;  Laterality: Left;   FEMORAL-POPLITEAL BYPASS GRAFT Right 08/11/2017   Procedure: RIGHT FEMORAL TO BELOW KNEE POPLITEAL ARTERKY  BYPASS GRAFT USING 6MM RINGED PROPATEN GRAFT;  Surgeon: Rosetta Posner, MD;  Location: [place];  Service: Vascular;  Laterality: Right;   FISTULOGRAM Left 10/29/2014   Procedure: FISTULOGRAM;  Surgeon: Angelia Mould, MD;  Location: [facility] CATH LAB;  Service: Cardiovascular;  Laterality: Left;   GAS/FLUID EXCHANGE Left 12/20/2018   Procedure: AIR GAS EXCHANGE;  Surgeon: Jalene Mullet, MD;  Location: [place];  Service: Ophthalmology;  Laterality: Left;   INSERTION OF DIALYSIS CATHETER N/A 09/05/2020   Procedure: INSERTION OF DIALYSIS CATHETER;  Surgeon: Cherre Robins, MD;  Location: [place];  Service: Vascular;  Laterality: N/A;   INSERTION OF DIALYSIS CATHETER Left 03/13/2021   Procedure: INSERTION OF DIALYSIS CATHETER;  Surgeon: Waynetta Sandy, MD;  Location: [place];  Service: Vascular;  Laterality: Left;   IR SINUS/FIST TUBE CHK-NON GI  08/12/2022   KNEE ARTHROSCOPY  Right 05/06/2018   Procedure: RIGHT KNEE ARTHROSCOPY;  Surgeon: Newt Minion, MD;  Location: [place];  Service: Orthopedics;  Laterality: Right;   KNEE ARTHROSCOPY Right 06/10/2018   Procedure: RIGHT KNEE ARTHROSCOPY  AND DEBRIDEMENT;  Surgeon: Newt Minion, MD;  Location: [place];  Service: Orthopedics;  Laterality: Right;   LIGATION OF ARTERIOVENOUS  FISTULA Left 04/21/2016   Procedure: LIGATION OF ARTERIOVENOUS  FISTULA LEFT ARM;  Surgeon: Elam Dutch, MD;  Location: [place];  Service: Vascular;  Laterality: Left;   LOWER EXTREMITY ANGIOGRAPHY N/A 04/06/2017   Procedure: Lower Extremity Angiography - Right;  Surgeon: Serafina Mitchell, MD;  Location: [place] [facility];  Service: Cardiovascular;  Laterality: N/A;   MEMBRANE PEEL Left 12/20/2018   Procedure: MEMBRANE PEEL;  Surgeon: Jalene Mullet, MD;  Location: [place];  Service: Ophthalmology;  Laterality: Left;   MEMBRANE PEEL Right 05/29/2022   Procedure: MEMBRANE PEEL;  Surgeon: Hurman Horn, MD;  Location: [place];  Service: Ophthalmology;  Laterality: Right;   PARS PLANA VITRECTOMY Right 05/29/2022   Procedure: PARS PLANA VITRECTOMY WITH 25 GAUGE;  Surgeon: Hurman Horn, MD;  Location: [place];  Service: Ophthalmology;  Laterality: Right;   PATCH ANGIOPLASTY Right 01/18/2015   Procedure: BASILIC VEIN PATCH ANGIOPLASTY USING VASCUGUARD PATCH;  Surgeon: Angelia Mould, MD;  Location: [place];  Service: Vascular;  Laterality: Right;   PERIPHERAL VASCULAR BALLOON ANGIOPLASTY  09/22/2016   Procedure: Peripheral Vascular Balloon Angioplasty;  Surgeon: Serafina Mitchell, MD;  Location: [place] [facility];  Service: Cardiovascular;;  Lt. Fistula   PERIPHERAL VASCULAR BALLOON ANGIOPLASTY Left 03/22/2018   Procedure: PERIPHERAL VASCULAR BALLOON ANGIOPLASTY;  Surgeon: Serafina Mitchell, MD;  Location: [place] [facility];  Service: Cardiovascular;  Laterality: Left;  Arm shunt   PERIPHERAL VASCULAR CATHETERIZATION N/A 06/23/2016   Procedure: Abdominal  Aortogram w/Lower Extremity;  Surgeon: Serafina Mitchell, MD;  Location: [place] [facility];  Service: Cardiovascular;  Laterality: N/A;   PERIPHERAL VASCULAR CATHETERIZATION  06/23/2016   Procedure: Peripheral Vascular Intervention;  Surgeon: Serafina Mitchell, MD;  Location: [place] [facility];  Service: Cardiovascular;;  lt common and external illiac artery   PHOTOCOAGULATION WITH LASER Left 12/20/2018   Procedure: PHOTOCOAGULATION WITH LASER;  Surgeon: Jalene Mullet, MD;  Location: [place];  Service: Ophthalmology;  Laterality: Left;   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   Procedure: REPAIR OF COMPLEX TRACTION RETINAL DETACHMENT;  Surgeon: Jalene Mullet, MD;  Location: [place];  Service: Ophthalmology;  Laterality: Left;   REVISION OF ARTERIOVENOUS GORETEX GRAFT Left 09/05/2020   Procedure: REVISION OF ARTERIOVENOUS GORETEX GRAFT LEFT;  Surgeon: Cherre Robins, MD;  Location: [place];  Service: Vascular;  Laterality: Left;     Social History:   reports that she quit smoking about 10 years ago. Her smoking use included cigarettes. She has a 14.00 pack-year smoking history. She has never used smokeless tobacco. She reports that she does not drink alcohol and does not use drugs.   Family History:  Her family history includes Diabetes in her father, sister, and sister; Other in her mother; Pancreatic cancer in her maternal grandmother. There is no history of Colon cancer.   Allergies Allergies  Allergen Reactions   Adhesive  [Tape] Hives   Lactose Intolerance (Gi) Diarrhea   Lisinopril Cough   Prednisone Swelling and Other (See Comments)    Excessive fluid buildup     Home Medications  Prior to Admission medications   Medication Sig Start Date End Date Taking? Authorizing Provider  albuterol (VENTOLIN HFA) 108 (90 Base) MCG/ACT inhaler Inhale 2 puffs into the lungs every 4 (four) hours as needed for wheezing or  shortness of breath. 07/21/22  Yes Piontek, Erin, MD   amLODipine (NORVASC) 10 MG tablet Take 10 mg by mouth daily. 02/09/22  Yes [provider]  aspirin EC 81 MG tablet Take 81 mg by mouth daily.   Yes [provider]  atorvastatin (LIPITOR) 40 MG tablet Take 40 mg by mouth at bedtime. 07/17/21  Yes [provider]  b complex-vitamin c-folic acid (NEPHRO-VITE) 0.8 MG TABS tablet Take 1 tablet by mouth at bedtime.   Yes [provider]  busPIRone (BUSPAR) 7.5 MG tablet Take 7.5 mg by mouth daily as needed (anxiety). 10/29/19  Yes [provider]  carvedilol (COREG) 12.5 MG tablet Take 12.5 mg by mouth See admin instructions. Take 1 tablet (12.5 mg) by mouth once daily in the evening on Mondays, Wednesdays, & Fridays. (Dialysis) Take 1 tablet (12.5 mg) by mouth twice daily on Sundays, Tuesdays, Thursdays, & Saturdays. (Non Dialysis)   Yes [provider]  cinacalcet (SENSIPAR) 60 MG tablet Take 60 mg by mouth See admin instructions. Take 1 tablet (60 mg) by mouth in the evening on Mondays, Wednesdays, & Fridays (dialysis) Take 1 tablet (60 mg) by mouth twice daily on Sundays, Tuesdays, Thursdays, Saturdays. (non dialysis) 11/28/18  Yes [provider]  diphenhydrAMINE HCl (BENADRYL ALLERGY PO) Take 1 capsule by mouth every Monday, Wednesday, and Friday with hemodialysis. If needed to help with allergies   Yes [provider]  ferric citrate (AURYXIA) 1 GM 210 MG(Fe) tablet Take 210-420 mg by mouth See admin instructions. Take 2 tablets (420 mg) by mouth with each meal & take 1 tablet (210 mg) by mouth with snacks.   Yes [provider]  fluticasone (FLONASE) 50 MCG/ACT nasal spray Place 2 sprays into both nostrils daily as needed for allergies or rhinitis. 04/07/22  Yes Valentina Shaggy, MD  gabapentin (NEURONTIN) 100 MG capsule TAKE 1 CAPSULE (100 MG TOTAL) BY MOUTH AT BEDTIME. WHEN NECESSARY FOR NEUROPATHY PAIN Patient taking differently: Take 100 mg by mouth at bedtime as  needed (When necessary for neuropathy pain). 12/13/18  Yes Newt Minion, MD  glipiZIDE (GLUCOTROL) 5 MG tablet Take 5 mg by mouth at bedtime.   Yes [provider]  isosorbide-hydrALAZINE (BIDIL) 20-37.5 MG tablet Take 1 tablet by mouth See admin instructions. hs on Monday,Wednesday,Friday (dialysis days) Tid on Tuesday,Thursday,Saturday,Sunday(non-dialysis days) Patient taking differently: Take 1 tablet by mouth See admin instructions. Take in the evening on Monday,Wednesday,Friday (dialysis days) BID on Tuesday,Thursday,Saturday,Sunday(non-dialysis days) 01/08/22  Yes [place], Celeste C, PA-C  lidocaine-prilocaine (EMLA) cream Apply 1 Application topically as needed. 03/31/22  Yes Vanessa Kick, MD  loperamide (IMODIUM) 2 MG capsule Take 2 capsules (4 mg total) by mouth 3 (three) times daily as needed for diarrhea or loose stools. 08/04/21  Yes Bonnielee Haff, MD  Methoxy PEG-Epoetin Beta (MIRCERA IJ) Mircera 08/20/21 10/23/22 Yes [provider]  montelukast (SINGULAIR) 10 MG tablet TAKE 1 TABLET BY MOUTH EVERYDAY AT BEDTIME Patient taking differently: Take 10 mg by mouth at bedtime. 04/07/22  Yes Valentina Shaggy, MD  omeprazole (PRILOSEC) 20 MG capsule Take 20 mg by mouth daily as needed (for acid reflux).   Yes [provider]  ondansetron (ZOFRAN) 4 MG tablet Take 1 tablet (4 mg total) by mouth every 6 (six) hours as needed for nausea. 05/22/19  Yes Aline August, MD  promethazine-dextromethorphan (PROMETHAZINE-DM) 6.25-15 MG/5ML syrup Take 5 mLs by mouth 4 (four) times daily as needed for cough. 07/21/22  Yes Piontek, Pinch,  MD  traZODone (DESYREL) 50 MG tablet Take 25-50 mg by mouth at bedtime as needed for sleep. 10/29/19  Yes [provider]  ursodiol (ACTIGALL) 250 MG tablet Take 250 mg by mouth 2 (two) times daily. 07/28/22  Yes [provider]  budesonide-formoterol (SYMBICORT) 160-4.5 MCG/ACT inhaler Inhale 2 puffs into the lungs 2 (two) times  daily. Patient not taking: Reported on 05/28/2022 04/07/22   Valentina Shaggy, MD  EPINEPHrine (AUVI-Q) 0.3 mg/0.3 mL IJ SOAJ injection Inject 0.3 mg into the muscle as needed for anaphylaxis. Patient not taking: Reported on 08/03/2022 12/24/20   Valentina Shaggy, MD  fluticasone-salmeterol (ADVAIR [facility]) [phone] MCG/ACT inhaler Inhale two puffs twice daily Patient not taking: Reported on 05/28/2022 12/24/20   Valentina Shaggy, MD  Spacer/Aero-Holding Chambers (AEROCHAMBER PLUS) inhaler Use with inhaler 03/09/21   Melynda Ripple, MD     Critical care time: 61 minutes     Noe Gens, MSN, APRN, NP-C, AGACNP-BC [facility] 08/27/2022, 2:29 PM   Please see [email or web address] for pager details.   From 7A-7P if no response, please call [phone] After hours, please call ELink [phone]

## 2022-08-27 NOTE — Significant Event (Addendum)
Rapid Response Event Note   Reason for Call :  desaturation  Initial Focused Assessment:  Pt lying in bed, responsive to deep painful stimuli with minimal flicker. Lung sounds rhonchus R>L. Heart tones normal.  Neuro NP at bedside, already placed pt on NRB from 4L [place], suctioned what appeared to be crushed meds and possibly applesauce. No meds given by mouth today per MAR. Agonal respirations observed, patient without cough/gag.  VS 110/56  (73) HR 103 RR 8 O2 82% 4L [place] *O2 sat difficult to pick up; placed on portable monitor upon arrival and for transport with intermittent monitoring 86-91%  ABG this AM at 1100, see results  Interventions:  Suctioned pt NRB placed with [place] RT to bedside MD to bedside CCM consult, to bedside  Plan of Care:  Transfer to ICU, intubate  Event Summary:  MD Notified: P. Candiss Norse MD Call Time: [place] Time: [id number] End Time: [place]  Newman Nickels, RN

## 2022-08-27 NOTE — Progress Notes (Signed)
eLink Physician-Brief Progress Note Patient Name: ELLEAH HEMSLEY DOB: 27-Jan-1951 MRN: [id number]   Date of Service  08/27/2022  HPI/Events of Note  Patient developed hypotension and an agonal rhythm with ventricular ectopy. Abg couldd not be obtained to r/o acidosis so an amp of Bicarb was pushed empirically with immediate improvement in the rhythm.  eICU Interventions  A second amp of Bicarb  ordered for a total of 100 meq, RT to attempt ABG again.        Kerry Kass Chiyeko Ferre 08/27/2022, 11:14 PM

## 2022-08-27 NOTE — Progress Notes (Signed)
MB called Nurse Hassan Rowan and Patient has a STAT MRI pending, she will call MRI for a more approximate time and call EEG back to inform for availability.

## 2022-08-27 NOTE — Progress Notes (Signed)
Assessed pt for USGPIV for vasopressor- Pt is right arm only with 2 PIV's in the R-arm. Pt does not have any veins suitable for USGPIV. Spoke to [employer] and advised to continue using the current PIV, place an IV-watch on the site and discuss with the MD about placing a central line. States he is waiting on the MD for next steps. [employer]

## 2022-08-27 NOTE — Progress Notes (Signed)
Contacted [facility] admissions this morning to request an update on pt's referral for new clinic placement while at snf. Awaiting response.   Melven Sartorius Renal Navigator [phone]

## 2022-08-27 NOTE — IPAL (Signed)
Interdisciplinary Goals of Care Family Meeting   Date carried out:: 08/27/2022  Location of the meeting: Conference room  Member's involved: Physician, Bedside Registered Nurse, and Family Member or next of kin  Durable Power of Attorney or acting medical decision maker: Stephanie Coup  Discussion: We discussed goals of care for [employer] .    The Clinical status was relayed to patient's husband and daughter in conference room in detail.   Updated and notified of patients medical condition.   We discussed that Jaely is going to complicated medical course, initially she came with perforated appendix, complicated further with left lower extremity ischemia requiring above-knee amputation and now probably she has aspirated, she is struggling to breathe with baseline renal failure and peripheral vascular disease  Patient has been and daughter were in agreement that we should not resuscitate in case if her heart stops, will continue with full scope of care for next 24 hours and meet again to discuss how she is doing if she is improving we will continue full scope of care and if not then we might consider palliative and comfort approach   Patient with Progressive multiorgan failure with a very high probablity of a very minimal chance of meaningful recovery despite all aggressive and optimal medical therapy.  Code status: Full DNR  Disposition: Continue current acute care    Family are satisfied with Plan of action and management. All questions answered   Jacky Kindle MD [facility] See Amion for pager If no response to pager, please call [phone] until 7pm After 7pm, Please call E-link [phone]

## 2022-08-27 NOTE — Progress Notes (Signed)
OT Cancellation Note  Patient Details Name: Destiny Day MRN: [id number] DOB: 06/14/1951   Cancelled Treatment:    Reason Eval/Treat Not Completed: Medical issues which prohibited therapy (Pt transferred to ICU and intubated.)  [name] 08/27/2022, 3:03 PM Cleta Alberts, OTR/L [place] Office: [phone]

## 2022-08-27 NOTE — Progress Notes (Signed)
Nutrition Follow-up  DOCUMENTATION CODES:   Non-severe (moderate) malnutrition in context of chronic illness  INTERVENTION:    Continue Renal Multivitamin w/ minerals daily Discontinue oral nutrition supplements If within [place], once Cortrak is placed, recommend initiating enteral nutrition: Start Vital 1.5 at 20 mL/hr and advance by 10 mL every 12 hours to goal rate of 50 mL/hr. (1200 mL per day) 60 mL ProSource TF20 - daily Provides 1880 kcal, 81 gm protein, and 917 mL free water daily.   NUTRITION DIAGNOSIS:   Moderate Malnutrition related to chronic illness as evidenced by severe muscle depletion, moderate fat depletion. - Ongoing  GOAL:   Patient will meet greater than or equal to 90% of their needs - Ongoing  MONITOR:   PO intake, Supplement acceptance, Labs, Skin, I & O's  REASON FOR ASSESSMENT:   Rounds    ASSESSMENT:   72 y.o. female presented to the ED with RLQ pain. PMH includes ESRD on HD (MWF), COPD, T2DM, GERD, HLD, and diverticulosis. Pt admitted with intra-abdominal abscess, concern for perforated appendicitis.   1/07 - Admitted 1/08 - drain placed 1/29 - s/p L AKA 1/30 - diet advanced to dysphagia 1 2/01 - SLP recommends NPO; transferred to the ICU- intubated  Discussed pt in rounds. Pt needing Cortrak placement tomorrow.   RD returned to pt bedside, rapid response team and MD at bedside. Per notes, pt transferred to the ICU for intubation. Ongoing family [place] conversations.    Medications reviewed and include: Risaquad, Ferric citrate, NovoLog SSI, Flagyl, Rena-vit, Protonix, IV antibiotics  Labs reviewed: Sodium 133, Potassium 4.4, Phosphorus 5.3, 24 hr CBGs 129-261  HD on 1/31 EDW: 57.4 kg Net UF: 900 mL   Diet Order:   Diet Order     None      EDUCATION NEEDS:   Not appropriate for education at this time  Skin:  Skin Assessment: Reviewed RN Assessment  Last BM:  2/1 - Type 7  Height:   Ht Readings from Last 1 Encounters:   08/21/2022 '5\' 5"'$  (1.651 m)    Weight:   Wt Readings from Last 1 Encounters:  08/27/22 40.3 kg   Ideal Body Weight:  56.8 kg  BMI:  Body mass index is 14.78 kg/m.  Estimated Nutritional Needs:  Kcal:  1700-1900 Protein:  85-100 grams Fluid:  UOP + 1 L   [name], LDN Clinical Dietitian See [facility] for contact information.

## 2022-08-27 NOTE — Progress Notes (Addendum)
[facility] Progress Note   Subjective:  Seen in room - less alert, not answering my questions today. O2 sats dropping when sleeping, but she wakes easily with talking to her and then O2 sats ~ 90 range. Spoke to RN to monitor. Dialyzed yesterday - did have RR called at end of her treatment. Looks dry today - encouraged to drink some water.  Objective Vitals:   08/27/22 0000 08/27/22 0306 08/27/22 0500 08/27/22 0814  BP: (!) 111/40 (!) 123/46  (!) 132/45  Pulse: 90 93  96  Resp: (!) 23 (!) 25  (!) 33  Temp:  98.4 F (36.9 C)  98.1 F (36.7 C)  TempSrc:  Oral  Oral  SpO2: 92% 90%  91%  Weight:   40.3 kg   Height:       Physical Exam General: Chronically ill appearing woman, NAD. Nasal O2 in place. Not answering my questions today and mouth breathing. Heart: RRR; no murmur Lungs: CTA anteriorly Abdomen: soft Extremities: L AKA bandaged, RLE without edema Dialysis Access: L AVG + bruit  Additional Objective Labs: Basic Metabolic Panel: Recent Labs  Lab 08/22/22 0307 08/07/2022 0720 08/17/2022 0855 08/27/22 0319  NA 135 135 135 133*  K 3.1* 3.8 3.4* 4.4  CL 94* 95* 96* 92*  CO2 25 20*  --  18*  GLUCOSE 178* 146* 152* 273*  BUN 9 29* 29* 23  CREATININE 2.95* 6.28* 6.60* 3.18*  CALCIUM 10.6* 10.9*  --  10.6*  PHOS 3.6 4.5  --  5.3*   Liver Function Tests: Recent Labs  Lab 08/22/22 0307 08/22/2022 0720 08/27/22 0319  ALBUMIN 2.2* 2.1* 2.2*   CBC: Recent Labs  Lab 08/23/2022 0527 08/08/2022 0720 07/31/2022 0855 08/25/22 0609 08/26/22 0709 08/27/22 0319  WBC 16.2* 13.9*  --  15.4* 13.7* 18.3*  HGB 11.0* 11.5*   < > 11.9* 11.6* 14.4  HCT 34.1* 35.5*   < > 38.5 35.2* 45.0  MCV 97.7 98.9  --  100.3* 97.0 97.8  PLT 199 191  --  193 210 176   < > = values in this interval not displayed.   Blood Culture    Component Value Date/Time   SDES WOUND 08/03/2022 1326   SPECREQUEST  ABCESS ABDOMEN 08/03/2022 1326   CULT  08/03/2022 1326    ABUNDANT  ESCHERICHIA COLI MODERATE KLEBSIELLA PNEUMONIAE ABUNDANT BACTEROIDES THETAIOTAOMICRON BETA LACTAMASE POSITIVE Performed at [facility], [place] [address], [place], [place] [address]    REPTSTATUS 08/08/2022 FINAL 08/03/2022 1326    Cardiac Enzymes: Recent Labs  Lab 08/23/22 0900  CKTOTAL 275*   CBG: Recent Labs  Lab 08/26/22 0837 08/26/22 1642 08/26/22 2133 08/27/22 0500 08/27/22 0819  GLUCAP 140* 140* 311* 261* 243*   Studies/Results: DG Chest Port 1 View  Result Date: 08/27/2022 CLINICAL DATA:  Shortness of breath EXAM: PORTABLE CHEST 1 VIEW COMPARISON:  08/04/2022 FINDINGS: Or artifact from EKG leads. Normal heart size and mediastinal contours. Notably confluent atheromatous calcification along the aorta. No acute infiltrate or edema. No effusion or pneumothorax. No acute osseous findings. IMPRESSION: No active disease. Electronically Signed   By: Jorje Guild M.D.   On: 08/27/2022 06:30   Medications:   ceFAZolin (ANCEF) IV 2 g (08/26/22 1717)    (feeding supplement) PROSource Plus  30 mL Oral BID BM   acidophilus  1 capsule Oral Daily   atorvastatin  40 mg Oral QHS   carvedilol  3.125 mg Oral BID WC   Chlorhexidine Gluconate  Cloth  6 each Topical Q0600   feeding supplement  237 mL Oral BID BM   feeding supplement (NEPRO CARB STEADY)  237 mL Oral BID BM   ferric citrate  420 mg Oral TID WC   fluticasone furoate-vilanterol  1 puff Inhalation Daily   Gerhardt's butt cream   Topical TID   heparin  5,000 Units Subcutaneous Q8H   insulin aspart  0-6 Units Subcutaneous TID WC   isosorbide-hydrALAZINE  1 tablet Oral BID   metroNIDAZOLE  500 mg Oral Q12H   midodrine  10 mg Oral TID WC   multivitamin  1 tablet Oral QHS   nystatin   Topical TID   pantoprazole  40 mg Oral BID   ursodiol  300 mg Oral BID    Dialysis Orders: [place] MWF 4h, 400/600, EDW 57.4kg, 2K/3.5Ca bath, LUA AVG  Heparin 2500 units IV initial + 1000 units IV mid run - Hectorol 20mg IV q HD -  Venofer '50mg'$  weekly - Mircera 33m q 4 wks, last 12/8   Assessment/Plan: Abdominal abscess: Gen surg and IR consulted, started on IV abx. IR placed drain in RLQ fluid collection. Fluid sent for culture - grew E coli/ Klebsiella bacteroides. 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 Repeat CT 1/24 - no new findings.  Per ID - Cefazolin 2 g IV q HD + Flagyl 500 BID until 09/17/22 PAD/critical limb ischemia w/rest pain - CTA on 1/15 w/occluded L fem-pop. VVS consulted with initial plan for angiogram + intervention. Unfortunately, pain and exam worse - s/p L AKA 08/16/2022. ESRD: Continue HD on MWF schedule - next HD tomorrow - no UF or heparin, 3K bath. Diarrhea with buttocks wound/irritation HTN/ volume: No significant edema on exam. Home amlodipine, BiDil on hold, Coreg with holding parameters. Now on midodrine '10mg'$  TID.  Anemia of ESRD: Hgb 14.4 - ?error. Secondary HPTH: CorrCa high, VDRA on hold. Phos ok - continue Auryxia as binder. Check PTH. T2DM COPD/asthma Encephalopathy, multifactorial: Waxing and wanning. Gabapentin on hold. Nutrition: Alb low, continue protein supplements. Currently on regular diet.   KaVeneta PentonPA-C 08/27/2022, 9:29 AM  [facility]  Seen and examined at bedside.  Chart reviewed.  I agree with assessment and plan as outlined above. Mental status has been waxing and waning.  She was quite somnolent this morning.  Gabapentin on hold.  Recommend to avoid sedatives.  She had dialysis with around a liter of UF yesterday.  Plan for regular HD tomorrow.  [facility].

## 2022-08-27 NOTE — Progress Notes (Addendum)
[facility]:   POD 3 LEFT AKA: Her AKA is healing nicely.  Begin daily dressing changes.  DVT PROPHYLAXIS: She is on subcu heparin.  Vascular surgery will follow peripherally.  Gae Gallop, MD 7:51 AM    Subjective  - Patient alert but not verbally responding this am.    Objective (!) 123/46 93 98.4 F (36.9 C) (Oral) (!) 25 90%  Intake/Output Summary (Last 24 hours) at 08/27/2022 8270 Last data filed at 08/26/2022 2115 Gross per 24 hour  Intake 0 ml  Output 900 ml  Net -900 ml   General no acute distress Lungs non labored breathing  Clean dry new dressing in place stump appears viable   Assessment/Planning: POD # 3 left AKA  Stump healing well and appears viable Continued Leukocytosis being managed by primary team on IV antibiotics Ancef  Roxy Horseman 08/27/2022 7:21 AM --  Laboratory Lab Results: Recent Labs    08/26/22 0709 08/27/22 0319  WBC 13.7* 18.3*  HGB 11.6* 14.4  HCT 35.2* 45.0  PLT 210 176   BMET Recent Labs    08/22/2022 0855 08/27/22 0319  NA 135 133*  K 3.4* 4.4  CL 96* 92*  CO2  --  18*  GLUCOSE 152* 273*  BUN 29* 23  CREATININE 6.60* 3.18*  CALCIUM  --  10.6*    COAG Lab Results  Component Value Date   INR 1.0 08/03/2022   INR 1.1 05/17/2019   INR 1.02 08/23/2017   No results found for: "PTT"

## 2022-08-27 NOTE — Progress Notes (Signed)
eLink Physician-Brief Progress Note Patient Name: Destiny Day DOB: [birth year] MRN: [id number]   Date of Service  08/27/2022  HPI/Events of Note  Blood pressure soft on 9 mcg of peripheral Norepinephrine gtt.  eICU Interventions  Peripheral Phenylephrine gtt added.        Kerry Kass Laura Radilla 08/27/2022, 8:04 PM

## 2022-08-27 NOTE — Progress Notes (Addendum)
Speech Language Pathology Treatment: Dysphagia  Patient Details Name: Destiny Day MRN: [id number] DOB: 1951-06-19 Today's Date: 08/27/2022 Time: 1002-1010 SLP Time Calculation (min) (ACUTE ONLY): 8 min  Assessment / Plan / Recommendation Clinical Impression  Pt on caseload to be seen today and MD also reordered given change in alertness and mental status since yesterday when rapid response was called. Pt lethargic, able to open eyes, could not follow commands or converse with therapist and easily falls back asleep if not stimulated. She held applesauce in oral cavity with initially several movements in attempts to propel then stopped with drool mixed with applesauce out of right side of mouth. Verbal and tactile stimulation with empty spoon to facilitate bolus transfer was not effective or given additional time. Applesauce remained on anterior tongue and eventually needed therapist to remove with [place]. Pt is not able to safely consume po's given mental status and lethargy (MD stated head CT ordered) and should be changed to NPO status and would need alternate means for nutrition and medication at this time. ST will continue to follow.    HPI HPI: Patient is a 72 y.o.  female with history of ESRD on HD MWF, chronic HFpEF, PAD-s/p bilateral TMA who presented with RLQ pain-found to have perforated appendicitis with abscess.  Hospital course complicated by worsening left lower extremity rest pain due to critical limb ischemia. BSE 08/15/22 with minimal oral dysphagia due to lack of dentition with oral retention. Recommended she continue on regular texture but encouraged softer foods or using puree to clear. ST reconsulted today for swallow.      SLP Plan  Continue with current plan of care      Recommendations for follow up therapy are one component of a multi-disciplinary discharge planning process, led by the attending physician.  Recommendations may be updated based on patient status,  additional functional criteria and insurance authorization.    Recommendations  Diet recommendations: NPO Medication Administration: Via alternative means                Oral Care Recommendations: Oral care QID Follow Up Recommendations: Skilled nursing-short term rehab (<3 hours/day) Assistance recommended at discharge: Frequent or constant Supervision/Assistance SLP Visit Diagnosis: Dysphagia, oral phase (R13.11) Plan: Continue with current plan of care           Houston Siren  08/27/2022, 10:20 AM

## 2022-08-27 NOTE — Progress Notes (Signed)
EEG complete - results pending 

## 2022-08-27 DEATH — deceased

## 2022-08-28 ENCOUNTER — Inpatient Hospital Stay (HOSPITAL_COMMUNITY): Payer: Medicare Other

## 2022-08-28 DIAGNOSIS — R4182 Altered mental status, unspecified: Secondary | ICD-10-CM

## 2022-08-28 DIAGNOSIS — R109 Unspecified abdominal pain: Secondary | ICD-10-CM | POA: Diagnosis not present

## 2022-08-28 DIAGNOSIS — Z515 Encounter for palliative care: Secondary | ICD-10-CM | POA: Diagnosis not present

## 2022-08-28 DIAGNOSIS — Z66 Do not resuscitate: Secondary | ICD-10-CM

## 2022-08-28 DIAGNOSIS — Z7189 Other specified counseling: Secondary | ICD-10-CM | POA: Diagnosis not present

## 2022-08-28 DIAGNOSIS — K659 Peritonitis, unspecified: Secondary | ICD-10-CM | POA: Diagnosis not present

## 2022-08-28 LAB — TYPE AND SCREEN
ABO/RH(D): B POS
Antibody Screen: NEGATIVE
Donor AG Type: NEGATIVE
Donor AG Type: NEGATIVE
Unit division: 0
Unit division: 0

## 2022-08-28 LAB — BASIC METABOLIC PANEL
Anion gap: 32 — ABNORMAL HIGH (ref 5–15)
BUN: 36 mg/dL — ABNORMAL HIGH (ref 8–23)
CO2: 12 mmol/L — ABNORMAL LOW (ref 22–32)
Calcium: 10.7 mg/dL — ABNORMAL HIGH (ref 8.9–10.3)
Chloride: 91 mmol/L — ABNORMAL LOW (ref 98–111)
Creatinine, Ser: 4.38 mg/dL — ABNORMAL HIGH (ref 0.44–1.00)
GFR, Estimated: 10 mL/min — ABNORMAL LOW (ref >60)
Glucose, Bld: 275 mg/dL — ABNORMAL HIGH (ref 70–99)
Potassium: 4 mmol/L (ref 3.5–5.1)
Sodium: 135 mmol/L (ref 135–145)

## 2022-08-28 LAB — BPAM RBC
Blood Product Expiration Date: 202402232359
Blood Product Expiration Date: 202402262359
Unit Type and Rh: 5100
Unit Type and Rh: 5100

## 2022-08-28 LAB — CBC
HCT: 35.3 % — ABNORMAL LOW (ref 36.0–46.0)
Hemoglobin: 11.1 g/dL — ABNORMAL LOW (ref 12.0–15.0)
MCH: 32 pg (ref 26.0–34.0)
MCHC: 31.4 g/dL (ref 30.0–36.0)
MCV: 101.7 fL — ABNORMAL HIGH (ref 80.0–100.0)
Platelets: 238 10*3/uL (ref 150–400)
RBC: 3.47 MIL/uL — ABNORMAL LOW (ref 3.87–5.11)
RDW: 15.8 % — ABNORMAL HIGH (ref 11.5–15.5)
WBC: 29 10*3/uL — ABNORMAL HIGH (ref 4.0–10.5)
nRBC: 2.3 % — ABNORMAL HIGH (ref 0.0–0.2)

## 2022-08-28 LAB — POCT I-STAT 7, (LYTES, BLD GAS, ICA,H+H)
Acid-base deficit: 16 mmol/L — ABNORMAL HIGH (ref 0.0–2.0)
Bicarbonate: 10 mmol/L — ABNORMAL LOW (ref 20.0–28.0)
Calcium, Ion: 1.28 mmol/L (ref 1.15–1.40)
HCT: 35 % — ABNORMAL LOW (ref 36.0–46.0)
Hemoglobin: 11.9 g/dL — ABNORMAL LOW (ref 12.0–15.0)
O2 Saturation: 99 %
Patient temperature: 96.5
Potassium: 4.3 mmol/L (ref 3.5–5.1)
Sodium: 130 mmol/L — ABNORMAL LOW (ref 135–145)
TCO2: 11 mmol/L — ABNORMAL LOW (ref 22–32)
pCO2 arterial: 24 mmHg — ABNORMAL LOW (ref 32–48)
pH, Arterial: 7.221 — ABNORMAL LOW (ref 7.35–7.45)
pO2, Arterial: 143 mmHg — ABNORMAL HIGH (ref 83–108)

## 2022-08-28 LAB — GLUCOSE, CAPILLARY
Glucose-Capillary: 146 mg/dL — ABNORMAL HIGH (ref 70–99)
Glucose-Capillary: 182 mg/dL — ABNORMAL HIGH (ref 70–99)
Glucose-Capillary: 241 mg/dL — ABNORMAL HIGH (ref 70–99)

## 2022-08-28 MED ORDER — MORPHINE SULFATE (PF) 2 MG/ML IV SOLN
INTRAVENOUS | Status: AC
  Filled 2022-08-28: qty 2

## 2022-08-28 MED ORDER — GLYCOPYRROLATE 0.2 MG/ML IJ SOLN: 0.2000 mg | INTRAMUSCULAR | Status: DC | PRN

## 2022-08-28 MED ORDER — POLYVINYL ALCOHOL 1.4 % OP SOLN
1.0000 [drp] | Freq: Four times a day (QID) | OPHTHALMIC | Status: DC | PRN
  Filled 2022-08-28: qty 15

## 2022-08-28 MED ORDER — MORPHINE SULFATE (PF) 2 MG/ML IV SOLN
2.0000 mg | INTRAVENOUS | Status: DC | PRN
  Administered 2022-08-28 (×2): 2 mg via INTRAVENOUS

## 2022-08-28 MED ORDER — SODIUM CHLORIDE 0.9 % IV SOLN: INTRAVENOUS | Status: DC

## 2022-08-28 MED ORDER — GLYCOPYRROLATE 1 MG PO TABS: 1.0000 mg | ORAL_TABLET | ORAL | Status: DC | PRN

## 2022-08-28 MED ORDER — MIDAZOLAM HCL 2 MG/2ML IJ SOLN: 2.0000 mg | INTRAMUSCULAR | Status: DC | PRN

## 2022-08-28 MED ORDER — SODIUM BICARBONATE 8.4 % IV SOLN
INTRAVENOUS | Status: DC
  Filled 2022-08-28 (×2): qty 1000

## 2022-08-30 LAB — CULTURE, RESPIRATORY W GRAM STAIN

## 2022-09-01 LAB — VITAMIN B1: Vitamin B1 (Thiamine): 141.3 nmol/L (ref 66.5–200.0)

## 2022-09-15 ENCOUNTER — Inpatient Hospital Stay: Payer: Medicare Other | Admitting: Internal Medicine

## 2022-09-25 NOTE — Progress Notes (Addendum)
Per night shift RN, E-link stated that levo ceiling could be raised to 22mg on a pIV(us).

## 2022-09-25 NOTE — Progress Notes (Signed)
Funeral home contacted and per family and funeral home plans to pick up pt per families request.

## 2022-09-25 NOTE — Progress Notes (Signed)
[facility] NEPHROLOGY PROGRESS NOTE  Assessment/ Plan: Pt is a 72 y.o. yo female   Dialysis Orders: [place] MWF 4h, 400/600, EDW 57.4kg, 2K/3.5Ca bath, LUA AVG  Heparin 2500 units IV initial + 1000 units IV mid run - Hectorol 96mg IV q HD - Venofer '50mg'$  weekly - Mircera 340m q 4 wks  # Septic shock/perforated appendicitis with abscess status post CT-guided drain, growing E. coli and Klebsiella from the abscess.  Seen by IR, surgery and ID.  The plan was to continue cefazolin 2 g IV with HD and Flagyl 500 twice daily until 09/17/2022.  However patient deteriorated on 2/1 and intubated in ICU, antibiotics changed to IV Zosyn.  Currently on high-dose of Levophed with soft BP.  # ESRD on HD: Patient is currently on Levophed, unstable.  I do think she will tolerate intermittent HD.  She has long-term poor prognosis and family has been discussing goals of care.  If family desires aggressive care then the only option is CRRT but it will not change the long-term prognosis.  I will discontinue IV fluid and await family decision.  She is DNR.  I have discussed with ICU team and nurse.  #PAD/critical limb ischemia w/rest pain - CTA on 1/15 w/occluded L fem-pop. VVS consulted with initial plan for angiogram + intervention. 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  #Anemia of ESRD: hb acceptable.   # Secondary HPTH: CorrCa high, VDRA on hold. Phos ok.  This continue [place] she is currently n.p.o. and intubated.  #Encephalopathy, multifactorial: Waxing and wanning. Gabapentin on hold.  # Severe metabolic acidosis due to shock.  Received IV bicarbonate.  I will hold further IV fluid because of ESRD status.  Monitor lab and plan as outlined above.  Subjective: Seen and examined in ICU.  Chart reviewed.  The event noted including change in mental status, transferred to ICU, intubation, on pressures etc. Objective Vital signs in last 24 hours: Vitals:   0202-22-2024800  [id number]-2024830 0202/22/2024845  BP: 112/65 116/60 101/60 95/62  Pulse:      Resp: '19 20 20 20  '$ Temp:      TempSrc:      SpO2:      Weight:      Height:       Weight change: 3.5 kg  Intake/Output Summary (Last 24 hours) at 2/22-Feb-2024103 Last data filed at 2/22-Feb-2024800 Gross per 24 hour  Intake 2794.79 ml  Output --  Net 2794.79 ml       Labs: RENAL PANEL Recent Labs    08/03/22 0008 08/03/22 0422 08/04/22 0438 08/05/22 0403 08/07/22 0670351/17/24 1432 08/13/22 0329 08/16/22 0327 08/17/22 0200931/22/24 0715 08/19/22 0881821/25/24 0599371/27/24 03169601/25/2024 0720 08/23/2022 0855 08/27/22 0319 08/27/22 1456 08/27/22 1551 02February 22, [id number]  NA  --    < > 131* 129*   < > 122* 132* 131* 131* 133* 134* 135 135 135 135 133* 135 135 135 130*  K  --    < > 4.4 3.3*   < > 2.5* 3.3* 3.0* 3.4* 3.9 2.6* 3.6 3.1* 3.8 3.4* 4.4 4.1 3.8 4.0 4.3  CL  --    < > 92* 88*   < > 84* 93* 95* 93* 94* 94* 95* 94* 95* 96* 92* 97*  --  91*  --   CO2  --    < > 25 25   < > 21* 24 22  $'23 22 24 24 25 'm$ 20*  --  18* 18*  --  12*  --   GLUCOSE  --    < > 148* 152*   < > 251* 148* 182* 172* 171* 173* 146* 178* 146* 152* 273* 111*  --  275*  --   BUN  --    < > 32* 22   < > 37* '11 11 16 18 18 10 9 '$ 29* 29* 23 27*  --  36*  --   CREATININE  --    < > 8.10* 5.14*   < > 6.50* 2.92* 3.91* 4.99* 5.09* 4.80* 3.28* 2.95* 6.28* 6.60* 3.18* 4.08*  --  4.38*  --   CALCIUM  --    < > 9.6 9.4   < > 9.6 9.3 9.8 10.1 10.1 10.2 10.2 10.6* 10.9*  --  10.6* 11.7*  --  10.7*  --   MG 1.8  --  2.1 2.0  --   --   --   --   --   --   --   --   --   --   --   --   --   --   --   --   PHOS  --   --  6.6*  --    < > 4.4 2.6 3.6 4.0 4.2 4.2 4.0 3.6 4.5  --  5.3*  --   --   --   --   ALBUMIN  --    < > 2.2* 2.2*   < > 2.0* 2.1* 2.0* 2.0* 2.0* 2.0* 2.2* 2.2* 2.1*  --  2.2* 2.2*  --   --   --    < > = values in this interval not displayed.     Liver Function Tests: Recent Labs  Lab 08/23/2022 0720  08/27/22 0319 08/27/22 1456  AST  --   --  423*  ALT  --   --  30  ALKPHOS  --   --  68  BILITOT  --   --  1.2  PROT  --   --  7.1  ALBUMIN 2.1* 2.2* 2.2*   No results for input(s): "LIPASE", "AMYLASE" in the last 168 hours. Recent Labs  Lab 08/27/22 1456  AMMONIA 57*   CBC: Recent Labs    08/03/22 0008 08/03/22 0422 08/27/22 0319 08/27/22 1214 08/27/22 1216 08/27/22 1456 08/27/22 1551 31-Aug-2022 0436 08-31-22 0437  HGB 10.9*   < > 14.4  --   --  13.6 13.3 11.1* 11.9*  MCV  --    < > 97.8  --   --  101.6*  --  101.7*  --   VITAMINB12 421  --   --   --  1,106*  --   --   --   --   FOLATE 11.2  --   --  13.5  --   --   --   --   --   FERRITIN 1,457*  --   --   --   --   --   --   --   --   TIBC 125*  --   --   --   --   --   --   --   --   IRON 26*  --   --   --   --   --   --   --   --   RETICCTPCT 1.3  --   --   --   --   --   --   --   --    < > =  values in this interval not displayed.    Cardiac Enzymes: Recent Labs  Lab 08/23/22 0900  CKTOTAL 275*   CBG: Recent Labs  Lab 08/27/22 1514 08/27/22 1918 08/27/22 2334 09-12-22 0323 09-12-2022 0713  GLUCAP 119* 180* 115* 146* 182*    Iron Studies: No results for input(s): "IRON", "TIBC", "TRANSFERRIN", "FERRITIN" in the last 72 hours. Studies/Results: EEG adult  Result Date: 2022/09/12 Lora Havens, MD     September 12, 2022  8:44 AM Patient Name: Destiny Day MRN: [id number] Epilepsy Attending: Lora Havens Referring Physician/Provider: Thurnell Lose, MD Date: 08/27/2022 Duration: 24.37 mins Patient history: 72 year old female with altered mental status.  EEG to evaluate for seizure Level of alertness: Awake AEDs during EEG study: None Technical aspects: This EEG study was done with scalp electrodes positioned according to the 10-20 International system of electrode placement. Electrical activity was reviewed with band pass filter of 1-'70Hz'$ , sensitivity of 7 uV/mm, display speed of 52m/sec with a '60Hz'$  notched  filter applied as appropriate. EEG data were recorded continuously and digitally stored.  Video monitoring was available and reviewed as appropriate. Description: No clear posterior dominant rhythm was seen.  EEG showed continuous generalized 3 to 6 Hz theta-delta slowing. Hyperventilation and photic stimulation were not performed.   ABNORMALITY - Continuous slow, generalized IMPRESSION: This study is suggestive of moderate diffuse encephalopathy, nonspecific etiology. No seizures or epileptiform discharges were seen throughout the recording. PLora Havens  Portable Chest xray  Result Date: 22024-02-17CLINICAL DATA:  Acute respiratory failure with hypoxia. Evaluate pleural effusion or pneumothorax. EXAM: PORTABLE CHEST 1 VIEW COMPARISON:  08/27/2022. FINDINGS: 0519 hours. Endotracheal tube tip projects over the midthoracic trachea. Enteric tube tip and side port project over the stomach. Improved aeration of the right lung base with minimal persistent linear opacity, likely atelectasis. No pleural effusion or pneumothorax. Stable cardiac and mediastinal contours with extensive aortic atherosclerosis. IMPRESSION: Improved aeration of the right lung base. Electronically Signed   By: WEmmit AlexandersM.D.   On: 0Feb 17, 202408:14   DG Chest Port 1 View  Result Date: 08/27/2022 CLINICAL DATA:  Acute respiratory distress. Endotracheal tube placement. EXAM: PORTABLE CHEST 1 VIEW COMPARISON:  Chest radiograph 08/27/2022 at 6:19 a.m. FINDINGS: The patient is rotated to the right with unchanged cardiomediastinal silhouette. An endotracheal tube has been placed and terminates proximally 3.5 cm above the carina. An enteric tube has been placed and terminates over the gastric fundus. Aortic atherosclerosis is again noted. There is new right basilar airspace opacity with associated volume loss including mild elevation of the right hemidiaphragm. The left lung remains clear. No sizable pleural effusion or pneumothorax is  identified. IMPRESSION: 1. Endotracheal and enteric tubes as above. 2. New right lower lobe opacity favored to reflect atelectasis. Electronically Signed   By: ALogan BoresM.D.   On: 08/27/2022 15:05   CT HEAD WO CONTRAST (5MM)  Result Date: 08/27/2022 CLINICAL DATA:  Delirium. EXAM: CT HEAD WITHOUT CONTRAST TECHNIQUE: Contiguous axial images were obtained from the base of the skull through the vertex without intravenous contrast. RADIATION DOSE REDUCTION: This exam was performed according to the departmental dose-optimization program which includes automated exposure control, adjustment of the mA and/or kV according to patient size and/or use of iterative reconstruction technique. COMPARISON:  Head CT 08/17/2022. FINDINGS: Brain: No acute hemorrhage. Unchanged chronic small-vessel disease. Cortical gray-white differentiation is otherwise preserved. Prominence of the ventricles and sulci within normal limits for age. No extra-axial collection. Basilar cisterns are patent. Unchanged  parenchymal calcification in the right pons. Vascular: No hyperdense vessel or unexpected calcification. Skull: Prior left frontotemporal craniotomy Sinuses/Orbits: Trace opacification of the left mastoid air cells. Paranasal sinuses are well aerated. Orbits are unremarkable. Other: None. IMPRESSION: 1. No acute intracranial abnormality. 2. Unchanged chronic small-vessel disease. Electronically Signed   By: Emmit Alexanders M.D.   On: 08/27/2022 10:48   DG Chest Port 1 View  Result Date: 08/27/2022 CLINICAL DATA:  Shortness of breath EXAM: PORTABLE CHEST 1 VIEW COMPARISON:  08/04/2022 FINDINGS: Or artifact from EKG leads. Normal heart size and mediastinal contours. Notably confluent atheromatous calcification along the aorta. No acute infiltrate or edema. No effusion or pneumothorax. No acute osseous findings. IMPRESSION: No active disease. Electronically Signed   By: Jorje Guild M.D.   On: 08/27/2022 06:30     Medications: Infusions:  sodium chloride     sodium chloride     norepinephrine (LEVOPHED) Adult infusion 20 mcg/min (09/16/2022 0800)   phenylephrine (NEO-SYNEPHRINE) Adult infusion     piperacillin-tazobactam (ZOSYN)  IV Stopped (09/16/2022 0204)   sodium bicarbonate 150 mEq in dextrose 5 % 1,150 mL infusion 150 mL/hr at Sep 16, 2022 0800    Scheduled Medications:  arformoterol  15 mcg Nebulization BID   aspirin  150 mg Rectal Daily   atorvastatin  40 mg Per Tube QHS   budesonide (PULMICORT) nebulizer solution  0.5 mg Nebulization BID   Chlorhexidine Gluconate Cloth  6 each Topical Q0600   ferric citrate  420 mg Oral TID WC   Gerhardt's butt cream   Topical TID   heparin  5,000 Units Subcutaneous Q8H   insulin aspart  0-6 Units Subcutaneous Q4H   midodrine  10 mg Per Tube TID WC   multivitamin  1 tablet Per Tube QHS   nystatin   Topical TID   mouth rinse  15 mL Mouth Rinse Q2H   pantoprazole (PROTONIX) IV  40 mg Intravenous Q24H   revefenacin  175 mcg Nebulization Daily    have reviewed scheduled and prn medications.  Physical Exam: General: Critically ill looking female, intubated, not responsive Heart:RRR, s1s2 nl Lungs: Coarse breath sound but lateral Abdomen:soft,  non-distended Extremities:No edema Dialysis Access: Left upper extremity AV graft present.  Kemauri Musa Tanna Furry September 16, 2022,11:03 AM  LOS: 26 days

## 2022-09-25 NOTE — Progress Notes (Signed)
NAME:  Destiny Day, MRN:  [id number], DOB:  05-04-1951, LOS: 16 ADMISSION DATE:  07/29/2022, CONSULTATION DATE: 2/1 REFERRING MD: Dr. Candiss Norse, CHIEF COMPLAINT: Altered mental status, respiratory depression  History of Present Illness:  72 year old female who presented to [facility] hospital on 1/7 with reports of right lower quadrant abdominal pain.  She was admitted to [facility] for further evaluation.  Initial workup notable for RUQ ultrasound with cholelithiasis and positive sonographic Murphy sign.  Subsequent CT of the abdomen pelvis showed findings suspicious for perforated appendicitis with periappendiceal abscess.  On 1/8 she underwent a CT-guided drainage of the right pelvic abscess.  Cultures from appendix drainage grew E. coli and Klebsiella.  Subsequent CT imaging of abdomen/pelvis on 1/11 showed an interval decrease in size of the right pelvic abscess after percutaneous drainage.  She underwent HIDA scan on 1/15 with no evidence of acute cholecystitis.  CT angio of the abdominal aorta on 1/15 showed severe atherosclerotic disease throughout the abdomen and pelvis and bilateral lower extremities.  Severe left outflow disease with occlusion of the left femoral-popliteal bypass graft, occlusion of native left SFA.  Complex collection in the right hemipelvis and large since 1/11 study.  1/17 interventional radiology performed a drain check to ensure patency of abscess drain.  CTA imaging findings were evaluated by vascular surgery.  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  Postoperative amputation course complicated by oversedation, metabolic encephalopathy and soft blood pressures.  She has been followed by infectious disease with recommendation for cefazolin to be given with HD and Flagyl with an end date of 09/17/2022.  On 2/1, the patient developed worsening altered mental status with agonal respirations.  PCCM urgently consulted for transfer to  ICU for intubation.  Husband requested full scope care prior to intubation in conversation with Dr. Candiss Norse.  Labs 2/1-NA 133, K4.4, CL 92, CO2 18, glucose 273, BUN 23, CR 3.18, WBC 18.3, Hgb 14.4, and platelets 176.  CXR post intubation with ETT, NG tube in good position.  New right lower lobe infiltrate concerning for aspiration and left lower lobe.   Pertinent  Medical History  Anxiety  COPD  HTN  HLD PVD DM  ESRD-on HD MWF Hiatal Hernia   Significant Hospital Events: Including procedures, antibiotic start and stop dates in addition to other pertinent events   1/7 Admit with perforated appendix 1/8 CT-guided drainage of right pelvic abscess 1/15 CT angio of the abdominal aorta with severe atherosclerotic disease throughout abdomen and pelvis & bilateral lower extremities.  Severe left outflow disease with occlusion of left femoral-popliteal bypass graft, occlusion of native left SFA.  1/17 Drain check / injection per IR, noted appendiceal fistula  1/22 CT head negative 1/24 CT abdomen pelvis with drainage catheter in place in the region of complex abnormality in the right pelvic sidewall 1/29 left AKA per Dr. Donzetta Matters VVS 2/1 Tx to ICU, emergent intubation in setting of AMS and suspected aspiration.  Near respiratory arrest.  Interim History / Subjective:   Remains critically ill intubated on life support   Objective   Blood pressure 95/62, pulse 82, temperature (!) 96.5 F (35.8 C), temperature source Axillary, resp. rate 20, height '5\' 5"'$  (1.651 m), weight 43.8 kg, SpO2 100 %.    Vent Mode: PRVC FiO2 (%):  [80 %-100 %] 80 % Set Rate:  [20 bmp] 20 bmp Vt Set:  [450 mL] 450 mL PEEP:  [8 cmH20-10 cmH20] [place]  Pressure:  [17 cmH20-31 cmH20] 17 cmH20   Intake/Output Summary (Last 24 hours) at 09/19/2022 8563 Last data filed at 09-19-22 0800 Gross per 24 hour  Intake 2794.79 ml  Output --  Net 2794.79 ml   Filed Weights   08/26/22 0500 08/27/22 0500 09-19-2022 0500   Weight: 51 kg 40.3 kg 43.8 kg    Examination: General: cachetic elderly frail fm, intubated on life support  HENT: ETT in place  Lungs: BL vented breath souds  Cardiovascular: Regular rhythm, S1-S2 Abdomen: Right lower quadrant drainage catheter Extremities: Muscle wasting, left AKA Neuro: Unresponsive intubated on mechanical support  Resolved Hospital Problem list     Assessment & Plan:   RLQ Pain with Perforated Appendicitis with Perippendiceal Abscess s/p CT Guided Drain Klebsiella and E-Coli from Abscess.  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  Plan: Appreciate ID, IR and CCS multidisciplinary care. Was transitioned to Zosyn on ICU admission yesterday Assess temperature curve and white blood cell count trend.  Acute Hypoxemic Respiratory Failure with RLL Aspiration  Possible aspiration on left as well.   Plan: Continue Zosyn for now, can likely go back to other antimicrobial regimen once ID weighs in. Continue mechanical vent support, low tidal volume ventilation Wean PEEP and FiO2 to maintain sats above 90%  Critical Limb Ischemia s/p L AKA Hx of Left Femoral-Popliteal Bypass Graft, CTA with occluded L Fem to below Knee Bypass this admission.  PVD Continue aspirin Postop care per vascular surgery  Hypotension  Continue midodrine 10 3 times daily Off Levophed at this time  Acute Metabolic Encephalopathy  In setting of aspiration, hypoxia. CBG normal on arrival to ICU.  Plan: Minimizing sedation Continue to follow neuroexam, EEG complete with consistent with encephalopathy May need to consider MRI imaging after goals of care discussion with family.  ESRD on HD  MWF  Plan: Continue per nephrology  HTN, HLD  HFrEF -dry to euvolemic on exam, 1L LR now  -hold bidil, coreg  -continue statin  Complex Right Ovarian Mass  L Upper Pole Renal Lesion  -outpatient GYN & Urology follow up needed, family aware of  issue & possibility of malignancy   DM II with Diabetic Retinopathy  -assess CBG now  SSI with CBGs  Asthma / COPD Overlap  Plan: Continue triple therapy nebs plus as needed albuterol  Anxiety  -hold buspar -supportive care   Adult Failure to Thrive  -critically ill, concern for poor prognosis   Severe Protein Calorie Malnutrition  -begin TF, monitor for re-feeding syndrome  Goals of care discussion I met with patient's family this morning.  Son daughter and niece.  At this time they have a good understanding of her multiple medical comorbidities and the likelihood that she is not going to survive long-term.  Her husband is going to return this afternoon around 2 PM.  We will have a repeat meeting and discuss next steps.  Best Practice (right click and "Reselect all SmartList Selections" daily)  Diet/type: NPO DVT prophylaxis: prophylactic heparin  GI prophylaxis: PPI Lines: N/A Foley:  N/A Code Status:  DNR Last date of multidisciplinary goals of care discussion: Met with family today.  Labs   CBC: Recent Labs  Lab 08/25/22 0609 08/26/22 0709 08/27/22 0319 08/27/22 1456 08/27/22 1551 09/19/22 0436 2022/09/19 0437  WBC 15.4* 13.7* 18.3* 25.8*  --  29.0*  --   HGB 11.9* 11.6* 14.4 13.6 13.3 11.1* 11.9*  HCT 38.5 35.2* 45.0  43.8 39.0 35.3* 35.0*  MCV 100.3* 97.0 97.8 101.6*  --  101.7*  --   PLT 193 210 176 239  --  238  --     Basic Metabolic Panel: Recent Labs  Lab 08/22/22 0307 08/03/2022 0720 07/27/2022 0855 08/27/22 0319 08/27/22 1456 08/27/22 1551 2022-08-30 0436 Aug 30, 2022 0437  NA 135 135 135 133* 135 135 135 130*  K 3.1* 3.8 3.4* 4.4 4.1 3.8 4.0 4.3  CL 94* 95* 96* 92* 97*  --  91*  --   CO2 25 20*  --  18* 18*  --  12*  --   GLUCOSE 178* 146* 152* 273* 111*  --  275*  --   BUN 9 29* 29* 23 27*  --  36*  --   CREATININE 2.95* 6.28* 6.60* 3.18* 4.08*  --  4.38*  --   CALCIUM 10.6* 10.9*  --  10.6* 11.7*  --  10.7*  --   PHOS 3.6 4.5  --  5.3*  --   --    --   --    GFR: Estimated Creatinine Clearance: 8.1 mL/min (A) (by C-G formula based on SCr of 4.38 mg/dL (H)). Recent Labs  Lab 08/26/22 0709 08/27/22 0319 08/27/22 1456 08/30/2022 0436  WBC 13.7* 18.3* 25.8* 29.0*    Liver Function Tests: Recent Labs  Lab 08/22/22 0307 08/10/2022 0720 08/27/22 0319 08/27/22 1456  AST  --   --   --  423*  ALT  --   --   --  30  ALKPHOS  --   --   --  68  BILITOT  --   --   --  1.2  PROT  --   --   --  7.1  ALBUMIN 2.2* 2.1* 2.2* 2.2*   No results for input(s): "LIPASE", "AMYLASE" in the last 168 hours. Recent Labs  Lab 08/27/22 1456  AMMONIA 57*    ABG    Component Value Date/Time   PHART 7.221 (L) 2022/08/30 0437   PCO2ART 24.0 (L) Aug 30, 2022 0437   PO2ART 143 (H) 08/30/2022 0437   HCO3 10.0 (L) 08-30-22 0437   TCO2 11 (L) 2022/08/30 0437   ACIDBASEDEF 16.0 (H) 08/30/2022 0437   O2SAT 99 August 30, 2022 0437     Coagulation Profile: No results for input(s): "INR", "PROTIME" in the last 168 hours.  Cardiac Enzymes: Recent Labs  Lab 08/23/22 0900  CKTOTAL 275*    HbA1C: Hgb A1c MFr Bld  Date/Time Value Ref Range Status  08/03/2022 12:08 AM 6.2 (H) 4.8 - 5.6 % Final    Comment:    (NOTE)         Prediabetes: 5.7 - 6.4         Diabetes: >6.4         Glycemic control for adults with diabetes: <7.0   08/04/2021 03:46 AM 6.4 (H) 4.8 - 5.6 % Final    Comment:    (NOTE) Pre diabetes:          5.7%-6.4%  Diabetes:              >6.4%  Glycemic control for   <7.0% adults with diabetes     CBG: Recent Labs  Lab 08/27/22 1514 08/27/22 1918 08/27/22 2334 08/30/22 0323 30-Aug-2022 0713  GLUCAP 119* 180* 115* 146* 182*    Review of Systems:   Unable to complete with patient due to AMS.   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   Surgical History:   Past Surgical History:  Procedure Laterality Date   A/V SHUNTOGRAM N/A 09/22/2016   Procedure: A/V Shuntogram - left arm;  Surgeon: Serafina Mitchell, MD;  Location: [place] [facility];  Service: Cardiovascular;  Laterality: N/A;   A/V SHUNTOGRAM N/A 03/22/2018   Procedure: A/V SHUNTOGRAM - left arm;  Surgeon: Serafina Mitchell, MD;  Location: [place] [facility];  Service: Cardiovascular;  Laterality: N/A;   A/V SHUNTOGRAM Left 11/17/2018   Procedure: A/V SHUNTOGRAM;  Surgeon: Angelia Mould, MD;  Location: [place] [facility];  Service: Cardiovascular;  Laterality: Left;   ABDOMINAL HYSTERECTOMY  1993`   AMPUTATION Left 09/01/2016   Procedure: LEFT FOOT TRANSMETATARSAL AMPUTATION;  Surgeon: Newt Minion, MD;  Location: [place];  Service: Orthopedics;  Laterality: Left;   AMPUTATION Right 08/11/2017   Procedure: RIGHT GREAT TOE AMPUTATION DIGIT;  Surgeon: Rosetta Posner, MD;  Location: [place];  Service: Vascular;  Laterality: Right;   AMPUTATION Right 12/31/2017   Procedure: RIGHT TRANSMETATARSAL AMPUTATION;  Surgeon: Newt Minion, MD;  Location: [place];  Service: Orthopedics;  Laterality: Right;   AMPUTATION Left 08/13/2022   Procedure: LEFT AMPUTATION ABOVE KNEE;  Surgeon: Angelia Mould, MD;  Location: [place];  Service: Vascular;  Laterality: Left;   AV FISTULA PLACEMENT Left 04/21/2016   Procedure: INSERTION OF  ARTERIOVENOUS (AV) GORE-TEX GRAFT ARM LEFT;  Surgeon: Elam Dutch, MD;  Location: [place];  Service: Vascular;  Laterality: Left;   BASCILIC VEIN TRANSPOSITION Left 07/10/2014   Procedure: BASCILIC VEIN TRANSPOSITION;  Surgeon: Angelia Mould, MD;  Location: [place];  Service: Vascular;  Laterality: Left;   [place] Right 11/08/2014   Procedure: FIRST STAGE BASILIC VEIN TRANSPOSITION;  Surgeon: Angelia Mould, MD;  Location: [place];  Service: Vascular;  Laterality: Right;   [place] Right 01/18/2015   Procedure: SECOND STAGE BASILIC VEIN TRANSPOSITION;  Surgeon: Angelia Mould, MD;  Location: [place];  Service: Vascular;  Laterality: Right;   CRANIOTOMY N/A 08/23/2017   Procedure: CRANIOTOMY HEMATOMA EVACUATION SUBDURAL;  Surgeon: Ashok Pall, MD;  Location: [place];  Service: Neurosurgery;  Laterality: N/A;   CRANIOTOMY Left 08/24/2017   Procedure: CRANIOTOMY FOR RECURRENT ACUTE SUBDURAL HEMATOMA;  Surgeon: Ashok Pall, MD;  Location: [place];  Service: Neurosurgery;  Laterality: Left;   ESOPHAGOGASTRODUODENOSCOPY N/A 03/31/2013   Procedure: ESOPHAGOGASTRODUODENOSCOPY (EGD);  Surgeon: Gatha Mayer, MD;  Location[facility];  Service: Endoscopy;  Laterality: N/A;   EYE SURGERY     laser surgery   FEMORAL-POPLITEAL BYPASS GRAFT Left 07/08/2016   Procedure: LEFT  FEMORAL-BELOW KNEE POPLITEAL ARTERY BYPASS GRAFT USING 6MM X 80 CM PROPATEN GORETEX GRAFT WITH RINGS.;  Surgeon: Rosetta Posner, MD;  Location: [facility] OR;  Service: Vascular;  Laterality: Left;   FEMORAL-POPLITEAL BYPASS GRAFT Right 08/11/2017   Procedure: RIGHT FEMORAL TO BELOW KNEE POPLITEAL ARTERKY  BYPASS GRAFT USING 6MM RINGED PROPATEN GRAFT;  Surgeon: Donnetta Hutching,  Arvilla Meres, MD;  Location: [place];  Service: Vascular;  Laterality: Right;   FISTULOGRAM Left 10/29/2014   Procedure: FISTULOGRAM;  Surgeon: Angelia Mould, MD;  Location: [facility] CATH LAB;  Service: Cardiovascular;  Laterality: Left;    GAS/FLUID EXCHANGE Left 12/20/2018   Procedure: AIR GAS EXCHANGE;  Surgeon: Jalene Mullet, MD;  Location: [place];  Service: Ophthalmology;  Laterality: Left;   INSERTION OF DIALYSIS CATHETER N/A 09/05/2020   Procedure: INSERTION OF DIALYSIS CATHETER;  Surgeon: Cherre Robins, MD;  Location: [place];  Service: Vascular;  Laterality: N/A;   INSERTION OF DIALYSIS CATHETER Left 03/13/2021   Procedure: INSERTION OF DIALYSIS CATHETER;  Surgeon: Waynetta Sandy, MD;  Location: [place];  Service: Vascular;  Laterality: Left;   IR SINUS/FIST TUBE CHK-NON GI  08/12/2022   KNEE ARTHROSCOPY Right 05/06/2018   Procedure: RIGHT KNEE ARTHROSCOPY;  Surgeon: Newt Minion, MD;  Location: [place];  Service: Orthopedics;  Laterality: Right;   KNEE ARTHROSCOPY Right 06/10/2018   Procedure: RIGHT KNEE ARTHROSCOPY AND DEBRIDEMENT;  Surgeon: Newt Minion, MD;  Location: [place];  Service: Orthopedics;  Laterality: Right;   LIGATION OF ARTERIOVENOUS  FISTULA Left 04/21/2016   Procedure: LIGATION OF ARTERIOVENOUS  FISTULA LEFT ARM;  Surgeon: Elam Dutch, MD;  Location: [place];  Service: Vascular;  Laterality: Left;   LOWER EXTREMITY ANGIOGRAPHY N/A 04/06/2017   Procedure: Lower Extremity Angiography - Right;  Surgeon: Serafina Mitchell, MD;  Location: [place] [facility];  Service: Cardiovascular;  Laterality: N/A;   MEMBRANE PEEL Left 12/20/2018   Procedure: MEMBRANE PEEL;  Surgeon: Jalene Mullet, MD;  Location: [place];  Service: Ophthalmology;  Laterality: Left;   MEMBRANE PEEL Right 05/29/2022   Procedure: MEMBRANE PEEL;  Surgeon: Hurman Horn, MD;  Location: [place];  Service: Ophthalmology;  Laterality: Right;   PARS PLANA VITRECTOMY Right 05/29/2022   Procedure: PARS PLANA VITRECTOMY WITH 25 GAUGE;  Surgeon: Hurman Horn, MD;  Location: [place];  Service: Ophthalmology;  Laterality: Right;   PATCH ANGIOPLASTY Right 01/18/2015   Procedure: BASILIC VEIN PATCH ANGIOPLASTY USING VASCUGUARD PATCH;  Surgeon:  Angelia Mould, MD;  Location: [place];  Service: Vascular;  Laterality: Right;   PERIPHERAL VASCULAR BALLOON ANGIOPLASTY  09/22/2016   Procedure: Peripheral Vascular Balloon Angioplasty;  Surgeon: Serafina Mitchell, MD;  Location: [place] [facility];  Service: Cardiovascular;;  Lt. Fistula   PERIPHERAL VASCULAR BALLOON ANGIOPLASTY Left 03/22/2018   Procedure: PERIPHERAL VASCULAR BALLOON ANGIOPLASTY;  Surgeon: Serafina Mitchell, MD;  Location: [place] [facility];  Service: Cardiovascular;  Laterality: Left;  Arm shunt   PERIPHERAL VASCULAR CATHETERIZATION N/A 06/23/2016   Procedure: Abdominal Aortogram w/Lower Extremity;  Surgeon: Serafina Mitchell, MD;  Location: [place] [facility];  Service: Cardiovascular;  Laterality: N/A;   PERIPHERAL VASCULAR CATHETERIZATION  06/23/2016   Procedure: Peripheral Vascular Intervention;  Surgeon: Serafina Mitchell, MD;  Location: [place] [facility];  Service: Cardiovascular;;  lt common and external illiac artery   PHOTOCOAGULATION WITH LASER Left 12/20/2018   Procedure: PHOTOCOAGULATION WITH LASER;  Surgeon: Jalene Mullet, MD;  Location: [place];  Service: Ophthalmology;  Laterality: Left;   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   Procedure: REPAIR OF COMPLEX TRACTION RETINAL DETACHMENT;  Surgeon: Jalene Mullet, MD;  Location: [place];  Service: Ophthalmology;  Laterality: Left;   REVISION OF ARTERIOVENOUS GORETEX GRAFT Left 09/05/2020   Procedure: REVISION OF ARTERIOVENOUS GORETEX GRAFT LEFT;  Surgeon:  Cherre Robins, MD;  [facility];  Service: Vascular;  Laterality: Left;     Social History:   reports that she quit smoking about 10 years ago. Her smoking use included cigarettes. She has a 14.00 pack-year smoking history. She has never used smokeless tobacco. She reports that she does not drink alcohol and does not use drugs.   Family History:  Her family history includes Diabetes in her father, sister, and sister; Other in her  mother; Pancreatic cancer in her maternal grandmother. There is no history of Colon cancer.   Allergies Allergies  Allergen Reactions   Adhesive  [Tape] Hives   Lactose Intolerance (Gi) Diarrhea   Lisinopril Cough   Prednisone Swelling and Other (See Comments)    Excessive fluid buildup     Home Medications  Prior to Admission medications   Medication Sig Start Date End Date Taking? Authorizing Provider  albuterol (VENTOLIN HFA) 108 (90 Base) MCG/ACT inhaler Inhale 2 puffs into the lungs every 4 (four) hours as needed for wheezing or shortness of breath. 07/21/22  Yes Piontek, Erin, MD  amLODipine (NORVASC) 10 MG tablet Take 10 mg by mouth daily. 02/09/22  Yes [provider]  aspirin EC 81 MG tablet Take 81 mg by mouth daily.   Yes [provider]  atorvastatin (LIPITOR) 40 MG tablet Take 40 mg by mouth at bedtime. 07/17/21  Yes [provider]  b complex-vitamin c-folic acid (NEPHRO-VITE) 0.8 MG TABS tablet Take 1 tablet by mouth at bedtime.   Yes [provider]  busPIRone (BUSPAR) 7.5 MG tablet Take 7.5 mg by mouth daily as needed (anxiety). 10/29/19  Yes [provider]  carvedilol (COREG) 12.5 MG tablet Take 12.5 mg by mouth See admin instructions. Take 1 tablet (12.5 mg) by mouth once daily in the evening on Mondays, Wednesdays, & Fridays. (Dialysis) Take 1 tablet (12.5 mg) by mouth twice daily on Sundays, Tuesdays, Thursdays, & Saturdays. (Non Dialysis)   Yes [provider]  cinacalcet (SENSIPAR) 60 MG tablet Take 60 mg by mouth See admin instructions. Take 1 tablet (60 mg) by mouth in the evening on Mondays, Wednesdays, & Fridays (dialysis) Take 1 tablet (60 mg) by mouth twice daily on Sundays, Tuesdays, Thursdays, Saturdays. (non dialysis) 11/28/18  Yes [provider]  diphenhydrAMINE HCl (BENADRYL ALLERGY PO) Take 1 capsule by mouth every Monday, Wednesday, and Friday with hemodialysis. If needed to help with allergies    Yes [provider]  ferric citrate (AURYXIA) 1 GM 210 MG(Fe) tablet Take 210-420 mg by mouth See admin instructions. Take 2 tablets (420 mg) by mouth with each meal & take 1 tablet (210 mg) by mouth with snacks.   Yes [provider]  fluticasone (FLONASE) 50 MCG/ACT nasal spray Place 2 sprays into both nostrils daily as needed for allergies or rhinitis. 04/07/22  Yes Valentina Shaggy, MD  gabapentin (NEURONTIN) 100 MG capsule TAKE 1 CAPSULE (100 MG TOTAL) BY MOUTH AT BEDTIME. WHEN NECESSARY FOR NEUROPATHY PAIN Patient taking differently: Take 100 mg by mouth at bedtime as needed (When necessary for neuropathy pain). 12/13/18  Yes Newt Minion, MD  glipiZIDE (GLUCOTROL) 5 MG tablet Take 5 mg by mouth at bedtime.   Yes [provider]  isosorbide-hydrALAZINE (BIDIL) 20-37.5 MG tablet Take 1 tablet by mouth See admin instructions. hs on Monday,Wednesday,Friday (dialysis days) Tid on Tuesday,Thursday,Saturday,Sunday(non-dialysis days) Patient taking differently: Take 1 tablet by mouth See admin instructions. Take in the evening on Monday,Wednesday,Friday (dialysis  days) BID on Tuesday,Thursday,Saturday,Sunday(non-dialysis days) 01/08/22  Yes Cantwell, Celeste C, PA-C  lidocaine-prilocaine (EMLA) cream Apply 1 Application topically as needed. 03/31/22  Yes Vanessa Kick, MD  loperamide (IMODIUM) 2 MG capsule Take 2 capsules (4 mg total) by mouth 3 (three) times daily as needed for diarrhea or loose stools. 08/04/21  Yes Bonnielee Haff, MD  Methoxy PEG-Epoetin Beta (MIRCERA IJ) Mircera 08/20/21 10/23/22 Yes [provider]  montelukast (SINGULAIR) 10 MG tablet TAKE 1 TABLET BY MOUTH EVERYDAY AT BEDTIME Patient taking differently: Take 10 mg by mouth at bedtime. 04/07/22  Yes Valentina Shaggy, MD  omeprazole (PRILOSEC) 20 MG capsule Take 20 mg by mouth daily as needed (for acid reflux).   Yes [provider]  ondansetron (ZOFRAN) 4 MG tablet Take 1 tablet (4  mg total) by mouth every 6 (six) hours as needed for nausea. 05/22/19  Yes Aline August, MD  promethazine-dextromethorphan (PROMETHAZINE-DM) 6.25-15 MG/5ML syrup Take 5 mLs by mouth 4 (four) times daily as needed for cough. 07/21/22  Yes Piontek, Junie Panning, MD  traZODone (DESYREL) 50 MG tablet Take 25-50 mg by mouth at bedtime as needed for sleep. 10/29/19  Yes [provider]  ursodiol (ACTIGALL) 250 MG tablet Take 250 mg by mouth 2 (two) times daily. 07/28/22  Yes [provider]  budesonide-formoterol (SYMBICORT) 160-4.5 MCG/ACT inhaler Inhale 2 puffs into the lungs 2 (two) times daily. Patient not taking: Reported on 05/28/2022 04/07/22   Valentina Shaggy, MD  EPINEPHrine (AUVI-Q) 0.3 mg/0.3 mL IJ SOAJ injection Inject 0.3 mg into the muscle as needed for anaphylaxis. Patient not taking: Reported on 08/03/2022 12/24/20   Valentina Shaggy, MD  fluticasone-salmeterol (ADVAIR [facility]) [phone] MCG/ACT inhaler Inhale two puffs twice daily Patient not taking: Reported on 05/28/2022 12/24/20   Valentina Shaggy, MD  Spacer/Aero-Holding Chambers (AEROCHAMBER PLUS) inhaler Use with inhaler 03/09/21   Melynda Ripple, MD      This patient is critically ill with multiple organ system failure; which, requires frequent high complexity decision making, assessment, support, evaluation, and titration of therapies. This was completed through the application of advanced monitoring technologies and extensive interpretation of multiple databases. During this encounter critical care time was devoted to patient care services described in this note for 32 minutes.  Garner Nash, DO [facility] Sep 15, 2022 9:24 AM

## 2022-09-25 NOTE — Progress Notes (Signed)
Speech Language Pathology Discharge Patient Details Name: Destiny Day MRN: [id number] DOB: [birth year] Today's Date: August 29, 2022 Time:  -     Patient discharged from SLP services secondary to medical decline - will need to re-order SLP to resume therapy services. Pt now requiring mechanical ventilation.   Please see latest therapy progress note for current level of functioning and progress toward goals.    Progress and discharge plan discussed with patient and/or caregiver: Patient unable to participate in discharge planning and no caregivers available   Houston Siren 2022/08/29, 11:29 AM

## 2022-09-25 NOTE — Progress Notes (Signed)
[facility] Progress Note  4 Days Post-Op  Subjective: Events noted from overnight.  Aspirated [place] apparently.  On maxed levo and bicarb.  Not responsive currently.  Objective: Vital signs in last 24 hours: Temp:  [96.5 F (35.8 C)-99.3 F (37.4 C)] 96.5 F (35.8 C) 2022/09/19 0413) Pulse Rate:  [76-114] 82 09-19-22 0343) Resp:  [12-33] 20 09-19-2022 0830) BP: (50-167)/(35-101) 101/60 09-19-2022 0830) SpO2:  [86 %-100 %] 100 % (02/01 1500) FiO2 (%):  [80 %-100 %] 80 % 2022-09-19 0739) Weight:  [43.8 kg] 43.8 kg 09-19-2022 0500) Last BM Date : 08/27/22  Intake/Output from previous day: 02/01 0701 - 2022/09/19 0700 In: 2582.9 [I.V.:1283.5; IV Piggyback:1299.3] Out: -  Intake/Output this shift: Total I/O In: 211.9 [I.V.:211.9] Out: -   PE: Gen:  ill on vent, unresponsive Pulm:  on vent Abd: soft, ND, scant thin/brown fluid in gravity bag  Lab Results:  Recent Labs    08/27/22 1456 08/27/22 1551 09/19/2022 0436 09/19/2022 0437  WBC 25.8*  --  29.0*  --   HGB 13.6   < > 11.1* 11.9*  HCT 43.8   < > 35.3* 35.0*  PLT 239  --  238  --    < > = values in this interval not displayed.   BMET Recent Labs    08/27/22 1456 08/27/22 1551 2022/09/19 0436 19-Sep-2022 0437  NA 135   < > 135 130*  K 4.1   < > 4.0 4.3  CL 97*  --  91*  --   CO2 18*  --  12*  --   GLUCOSE 111*  --  275*  --   BUN 27*  --  36*  --   CREATININE 4.08*  --  4.38*  --   CALCIUM 11.7*  --  10.7*  --    < > = values in this interval not displayed.   PT/INR No results for input(s): "LABPROT", "INR" in the last 72 hours. CMP     Component Value Date/Time   NA 130 (L) September 19, 2022 0437   NA 135 (A) 07/15/2016 0000   K 4.3 09-19-22 0437   CL 91 (L) 09/19/2022 0436   CO2 12 (L) Sep 19, 2022 0436   GLUCOSE 275 (H) 19-Sep-2022 0436   BUN 36 (H) Sep 19, 2022 0436   BUN 30 (A) 07/15/2016 0000   CREATININE 4.38 (H) 2022/09/19 0436   CALCIUM 10.7 (H) 19-Sep-2022 0436   PROT 7.1 08/27/2022 1456   ALBUMIN 2.2 (L)  08/27/2022 1456   AST 423 (H) 08/27/2022 1456   ALT 30 08/27/2022 1456   ALKPHOS 68 08/27/2022 1456   BILITOT 1.2 08/27/2022 1456   GFRNONAA 10 (L) Sep 19, 2022 0436   GFRAA 6 (L) 06/26/2019 0500   Lipase     Component Value Date/Time   LIPASE 32 08/21/2022 1730       Studies/Results: EEG adult  Result Date: Sep 19, 2022 Lora Havens, MD     09-19-2022  8:44 AM Patient Name: Destiny Day MRN: [id number] Epilepsy Attending: Lora Havens Referring Physician/Provider: Thurnell Lose, MD Date: 08/27/2022 Duration: 24.37 mins Patient history: 72 year old female with altered mental status.  EEG to evaluate for seizure Level of alertness: Awake AEDs during EEG study: None Technical aspects: This EEG study was done with scalp electrodes positioned according to the 10-20 International system of electrode placement. Electrical activity was reviewed with band pass filter of 1-'70Hz'$ , sensitivity of 7 uV/mm, display speed of 13m/sec with a '60Hz'$  notched filter applied as appropriate.  EEG data were recorded continuously and digitally stored.  Video monitoring was available and reviewed as appropriate. Description: No clear posterior dominant rhythm was seen.  EEG showed continuous generalized 3 to 6 Hz theta-delta slowing. Hyperventilation and photic stimulation were not performed.   ABNORMALITY - Continuous slow, generalized IMPRESSION: This study is suggestive of moderate diffuse encephalopathy, nonspecific etiology. No seizures or epileptiform discharges were seen throughout the recording. Lora Havens   Portable Chest xray  Result Date: 09-25-22 CLINICAL DATA:  Acute respiratory failure with hypoxia. Evaluate pleural effusion or pneumothorax. EXAM: PORTABLE CHEST 1 VIEW COMPARISON:  08/27/2022. FINDINGS: 0519 hours. Endotracheal tube tip projects over the midthoracic trachea. Enteric tube tip and side port project over the stomach. Improved aeration of the right lung base with minimal  persistent linear opacity, likely atelectasis. No pleural effusion or pneumothorax. Stable cardiac and mediastinal contours with extensive aortic atherosclerosis. IMPRESSION: Improved aeration of the right lung base. Electronically Signed   By: Emmit Alexanders M.D.   On: 09/25/22 08:14   DG Chest Port 1 View  Result Date: 08/27/2022 CLINICAL DATA:  Acute respiratory distress. Endotracheal tube placement. EXAM: PORTABLE CHEST 1 VIEW COMPARISON:  Chest radiograph 08/27/2022 at 6:19 a.m. FINDINGS: The patient is rotated to the right with unchanged cardiomediastinal silhouette. An endotracheal tube has been placed and terminates proximally 3.5 cm above the carina. An enteric tube has been placed and terminates over the gastric fundus. Aortic atherosclerosis is again noted. There is new right basilar airspace opacity with associated volume loss including mild elevation of the right hemidiaphragm. The left lung remains clear. No sizable pleural effusion or pneumothorax is identified. IMPRESSION: 1. Endotracheal and enteric tubes as above. 2. New right lower lobe opacity favored to reflect atelectasis. Electronically Signed   By: Logan Bores M.D.   On: 08/27/2022 15:05   CT HEAD WO CONTRAST (5MM)  Result Date: 08/27/2022 CLINICAL DATA:  Delirium. EXAM: CT HEAD WITHOUT CONTRAST TECHNIQUE: Contiguous axial images were obtained from the base of the skull through the vertex without intravenous contrast. RADIATION DOSE REDUCTION: This exam was performed according to the departmental dose-optimization program which includes automated exposure control, adjustment of the mA and/or kV according to patient size and/or use of iterative reconstruction technique. COMPARISON:  Head CT 08/17/2022. FINDINGS: Brain: No acute hemorrhage. Unchanged chronic small-vessel disease. Cortical gray-white differentiation is otherwise preserved. Prominence of the ventricles and sulci within normal limits for age. No extra-axial collection.  Basilar cisterns are patent. Unchanged parenchymal calcification in the right pons. Vascular: No hyperdense vessel or unexpected calcification. Skull: Prior left frontotemporal craniotomy Sinuses/Orbits: Trace opacification of the left mastoid air cells. Paranasal sinuses are well aerated. Orbits are unremarkable. Other: None. IMPRESSION: 1. No acute intracranial abnormality. 2. Unchanged chronic small-vessel disease. Electronically Signed   By: Emmit Alexanders M.D.   On: 08/27/2022 10:48   DG Chest Port 1 View  Result Date: 08/27/2022 CLINICAL DATA:  Shortness of breath EXAM: PORTABLE CHEST 1 VIEW COMPARISON:  08/04/2022 FINDINGS: Or artifact from EKG leads. Normal heart size and mediastinal contours. Notably confluent atheromatous calcification along the aorta. No acute infiltrate or edema. No effusion or pneumothorax. No acute osseous findings. IMPRESSION: No active disease. Electronically Signed   By: Jorje Guild M.D.   On: 08/27/2022 06:30    Anti-infectives: Anti-infectives (From admission, onward)    Start     Dose/Rate Route Frequency Ordered Stop   08/27/22 1645  piperacillin-tazobactam (ZOSYN) IVPB 2.25 g  2.25 g 100 mL/hr over 30 Minutes Intravenous Every 8 hours 08/27/22 1556     08/21/22 1800  ceFAZolin (ANCEF) IVPB 2g/100 mL premix  Status:  Discontinued        2 g 200 mL/hr over 30 Minutes Intravenous Every M-W-F (1800) 08/19/22 1514 08/27/22 1505   08/20/22 1400  ceFAZolin (ANCEF) IVPB 1 g/50 mL premix        1 g 100 mL/hr over 30 Minutes Intravenous  Once 08/19/22 1349 08/20/22 1548   08/20/22 0000  ceFAZolin (ANCEF) IVPB 2g/100 mL premix  Status:  Discontinued        2 g 200 mL/hr over 30 Minutes Intravenous Every M-W-F (Hemodialysis) 08/19/22 1349 08/19/22 1514   08/19/22 1330  ceFAZolin (ANCEF) IVPB 2g/100 mL premix  Status:  Discontinued        2 g 200 mL/hr over 30 Minutes Intravenous Every M-W-F (Hemodialysis) 08/19/22 1344 08/19/22 1349   08/10/22 1400   cefTRIAXone (ROCEPHIN) 2 g in sodium chloride 0.9 % 100 mL IVPB  Status:  Discontinued        2 g 200 mL/hr over 30 Minutes Intravenous Every 24 hours 08/10/22 1025 08/19/22 1344   08/10/22 1030  metroNIDAZOLE (FLAGYL) tablet 500 mg  Status:  Discontinued        500 mg Oral Every 12 hours 08/10/22 1025 08/27/22 1505   08/08/22 1545  piperacillin-tazobactam (ZOSYN) IVPB 2.25 g  Status:  Discontinued        2.25 g 100 mL/hr over 30 Minutes Intravenous Every 8 hours 08/08/22 1455 08/10/22 1025   08/08/22 1415  ceFAZolin (ANCEF) IVPB 1 g/50 mL premix  Status:  Discontinued        1 g 100 mL/hr over 30 Minutes Intravenous Daily 08/08/22 1327 08/08/22 1454   08/11/2022 2245  piperacillin-tazobactam (ZOSYN) IVPB 2.25 g  Status:  Discontinued        2.25 g 100 mL/hr over 30 Minutes Intravenous Every 8 hours 07/27/2022 2215 08/08/22 1327   08/09/2022 1900  piperacillin-tazobactam (ZOSYN) IVPB 3.375 g        3.375 g 100 mL/hr over 30 Minutes Intravenous  Once 08/23/2022 1853 08/23/2022 2049        Assessment/Plan Suspected perforated appendicitis with pelvic abscess - S/p IR drain placement 1/8. 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 To gravity drainage, no flushes.  - WBC up to 29, but likely secondary to aspiration event - Repeat CT scan 1/24 with some thickening of the pelvic sidewall but more likely this is a phlegmon, drain in place with confirmed fistula, no new issues - Hopefully fistula will close on its own. - RN states that family may transition more towards comfort measures.  Will monitor for this. - could order CT of abd/pel if concerned about this, but her abdomen seems benign and stable and seems source likely clear from aspiration event and events following.    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    LOS: 26 days    Henreitta Cea, [facility] Sep 04, 2022, 8:55 AM Please see Amion for pager number during day hours 7:00am-4:30pm

## 2022-09-25 NOTE — Progress Notes (Signed)
Spoke with patient's husband, [name] regarding Ms. Mucci's arrangements; family have selected the below funeral home:  [facility] [address] [place], [place] [address] [phone]

## 2022-09-25 NOTE — Progress Notes (Signed)
Per NP pt is going comfort after speaking w family and neo turned off. NP now putting in orders for comfort care

## 2022-09-25 NOTE — Progress Notes (Addendum)
Pt BP continues to drop despite being max on levo and MD @ bedside. Neo restarted

## 2022-09-25 NOTE — Progress Notes (Signed)
Daily Progress Note   Patient Name: Destiny Day       Date: 09-06-22 DOB: 09-16-1950  Age: 72 y.o. MRN#: [id number] Attending Physician: Garner Nash, DO Primary Care Physician: Cipriano Mile, NP Admit Date: 08/12/2022 Length of Stay: 26 days  Reason for Consultation/Follow-up: Establishing goals of care  HPI/Patient Profile:  72 y.o. female  with past medical history of ESRD on HD MWF, chronic HFpEF, PAD-s/p bilateral TMA who presented with RLQ pain-found to have perforated appendicitis with abscess. Hospital course complicated by worsening left lower extremity rest pain due to critical limb ischemia requiring AKA 08/07/2022. She was admitted on 08/18/2022 with perforated appendicitis with periappendiceal abscess status post drain placement by IR, critical limb ischemia status post femoropopliteal bypass requiring AKA 08/03/2022, ESRD on IHD, acute metabolic encephalopathy, HFrEF, and others.    PMT was consulted for Destiny Day conversations.  Subjective:   Subjective: Chart Reviewed. Updates received. Patient Assessed. Created space and opportunity for patient  and family to explore thoughts and feelings regarding current medical situation.  Today's Discussion: Today I saw the patient bedside.  Family was present in conference room.  I spoke with the nurse and the nurse practitioner Noe Gens, NP.  Yesterday the patient suffered an aspiration event and rapid decline, and your cardiac arrest.  She was emergently intubated.  At this time she remains on pressors and a few minutes after my arrival she began having change in heart rhythm, bigeminy, progressive hypotension despite pressors.  She appears to be actively dying despite all aggressive medical interventions.  I went with Noe Gens, NP to speak with the patient's family.  Her daughter and granddaughter were present, son had stepped off the unit but they called him to return.  The patient's husband had just gone home after staying all  night but they called him to return to the hospital as soon as possible.  When the patient's family (except husband) arrived we provided an update.  We shared that unfortunately despite all medical interventions she appears to be actively dying.  We shared that there is unlikely anything that we can do to prevent this.  We discussed the possibility of using comfort medications to help ease her passing.  We could do this either with the ventilator in place or after compassionate extubation.  After extensive discussion they requested to maintain current scope of care until the patient's husband can arrive.  However, she will remain a DNR.  They seem to be leaning towards comfort care but would like her husband/their father there as well.  I provided emotional and general support through therapeutic listening, empathy, sharing of stories, and other techniques. I answered all questions and addressed all concerns to the best of my ability.  Review of Systems  Unable to perform ROS: Acuity of condition    Objective:   Vital Signs:  BP (!) 81/47   Pulse 82   Temp (!) 96.5 F (35.8 C) (Axillary)   Resp 20   Ht '5\' 5"'$  (1.651 m)   Wt 43.8 kg   SpO2 100%   BMI 16.07 kg/m   Physical Exam: Physical Exam Vitals and nursing note reviewed.  Constitutional:      General: She is not in acute distress.    Appearance: She is ill-appearing and toxic-appearing.     Interventions: She is sedated and intubated.  HENT:     Head: Normocephalic and atraumatic.  Cardiovascular:     Rate and Rhythm: Normal rate.  Comments: Bigeminy noted, significant hypotension (SBP 60) despite pressors Pulmonary:     Effort: Pulmonary effort is normal. No respiratory distress. She is intubated.  Abdominal:     General: Abdomen is flat.     Palpations: Abdomen is soft.     Palliative Assessment/Data: 10%    Existing Vynca/ACP Documentation: None  Assessment & Plan:   Impression: Present on Admission:   Abdominal infection (Destiny Day)  72 year old female with acute presentation and chronic comorbidities as described above. She has been through quite a lot with perforated appendicitis with abscess status post drain placement, critical limb ischemia requiring AKA yesterday, end-stage renal disease at baseline on hemodialysis Monday, Wednesday, Friday. She also has acute metabolic encephalopathy likely secondary to delirium from multifactorial etiology, although this seemed to be improving yesterday she suffered a significant decline.  She is now in the ICU, intubated after aspiration pressors.  This morning she appears to be actively dying despite all medical interventions.  We are waiting for patient's husband to arrive to make firm decisions on how to proceed. Overall long-term prognosis guarded to poor   SUMMARY OF RECOMMENDATIONS   Remain DNR Recommend transition to comfort care if agreeable by family Continue emotional support of patient and family Will await husband's arrival PMT is available to assist as needed  Symptom Management:  Per primary team PMT is available to assist as needed  Code Status: DNR  Prognosis: Hours - Days  Discharge Planning: Anticipated Hospital Death  Discussed with: Patient's family, medical team, nursing team  **ADDENDUM: Per PCCM notes after the patient's husband arrived they elected for compassionate extubation and comfort care.  The patient passed away shortly after extubation.  Thank you for allowing [place] to participate in the care of Destiny Day PMT will continue to support holistically.  Billing based on MDM: High  Problems Addressed: One acute or chronic illness or injury that poses a threat to life or bodily function  Amount and/or Complexity of Data: Category 3:Discussion of management or test interpretation with external physician/other qualified health care professional/appropriate source (not separately reported)  Risks: Decision not to  resuscitate or to de-escalate care because of poor prognosis  Walden Field, NP Palliative Medicine Team  Team Phone # [phone] (Nights/Weekends)  03/25/2021, 8:17 AM

## 2022-09-25 NOTE — Procedures (Signed)
Patient Name: Destiny Day  MRN: [id number]  Epilepsy Attending: Lora Havens  Referring Physician/Provider: Thurnell Lose, MD  Date: 08/27/2022 Duration: 24.37 mins  Patient history: 72 year old female with altered mental status.  EEG to evaluate for seizure  Level of alertness: Awake  AEDs during EEG study: None  Technical aspects: This EEG study was done with scalp electrodes positioned according to the 10-20 International system of electrode placement. Electrical activity was reviewed with band pass filter of 1-'70Hz'$ , sensitivity of 7 uV/mm, display speed of 64m/sec with a '60Hz'$  notched filter applied as appropriate. EEG data were recorded continuously and digitally stored.  Video monitoring was available and reviewed as appropriate.  Description: No clear posterior dominant rhythm was seen.  EEG showed continuous generalized 3 to 6 Hz theta-delta slowing. Hyperventilation and photic stimulation were not performed.     ABNORMALITY - Continuous slow, generalized  IMPRESSION: This study is suggestive of moderate diffuse encephalopathy, nonspecific etiology. No seizures or epileptiform discharges were seen throughout the recording.  [name] OBarbra Sarks

## 2022-09-25 NOTE — Death Summary Note (Signed)
DEATH SUMMARY   Patient Details  Name: Destiny Day MRN: [id number] DOB: [birth year]  Admission/Discharge Information   Admit Date:  Aug 22, 2022  Date of Death: Date of Death: September 17, 2022  Time of Death: Time of Death: 10-27-39  Length of Stay: 2022/11/09  Referring Physician: Cipriano Mile, NP   Reason(s) for Hospitalization   72 year old female who presented to [facility] hospital on 08/22/22 with reports of right lower quadrant abdominal pain.   She was admitted to [facility] for further evaluation.  Initial workup notable for RUQ ultrasound with cholelithiasis and positive sonographic Murphy sign.  Subsequent CT of the abdomen pelvis showed findings suspicious for perforated appendicitis with periappendiceal abscess.  On 1/8 she underwent a CT-guided drainage of the right pelvic abscess.  Cultures from appendix drainage grew E. coli and Klebsiella.  Subsequent CT imaging of abdomen/pelvis on 1/11 showed an interval decrease in size of the right pelvic abscess after percutaneous drainage.  She underwent HIDA scan on 1/15 with no evidence of acute cholecystitis.  CT angio of the abdominal aorta on 1/15 showed severe atherosclerotic disease throughout the abdomen and pelvis and bilateral lower extremities.  Severe left outflow disease with occlusion of the left femoral-popliteal bypass graft, occlusion of native left SFA.  Complex collection in the right hemipelvis and large since 1/11 study.  1/17 interventional radiology performed a drain check to ensure patency of abscess drain.  CTA imaging findings were evaluated by vascular surgery.  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  Postoperative amputation course complicated by oversedation, metabolic encephalopathy and soft blood pressures.  She has been followed by infectious disease with recommendation for cefazolin to be given with HD and Flagyl with an end date of 09/17/2022.  On 2/1, the patient developed worsening  altered mental status with agonal respirations.  PCCM urgently consulted for transfer to ICU for intubation.   Husband requested full scope care prior to intubation in conversation with Dr. Candiss Norse.  Labs 2/1-NA 133, K4.4, CL 92, CO2 18, glucose 273, BUN 23, CR 3.18, WBC 18.3, Hgb 14.4, and platelets 176.  CXR post intubation with ETT, NG tube in good position.  New right lower lobe infiltrate concerning for aspiration and left lower lobe.  Diagnoses  Preliminary cause of death:  Secondary Diagnoses (including complications and co-morbidities):  Principal Problem:   Abdominal infection ([place]) Active Problems:   Intra-abdominal abscess ([place])   Painful and cold lower extremity   Malnutrition of moderate degree   Septic shock [facility])   Brief Hospital Course (including significant findings, care, treatment, and services provided and events leading to death)   72 year old female who presented to [facility] hospital on 08/22/22 with reports of right lower quadrant abdominal pain.   She was admitted to [facility] for further evaluation.  Initial workup notable for RUQ ultrasound with cholelithiasis and positive sonographic Murphy sign.  Subsequent CT of the abdomen pelvis showed findings suspicious for perforated appendicitis with periappendiceal abscess.  On 1/8 she underwent a CT-guided drainage of the right pelvic abscess.  Cultures from appendix drainage grew E. coli and Klebsiella.  Subsequent CT imaging of abdomen/pelvis on 1/11 showed an interval decrease in size of the right pelvic abscess after percutaneous drainage.  She underwent HIDA scan on 1/15 with no evidence of acute cholecystitis.  CT angio of the abdominal aorta on 1/15 showed severe atherosclerotic disease throughout the abdomen and pelvis and bilateral lower extremities.  Severe left outflow disease with occlusion  of the left femoral-popliteal bypass graft, occlusion of native left SFA.  Complex collection in the right hemipelvis and large since 1/11  study.  1/17 interventional radiology performed a drain check to ensure patency of abscess drain.  CTA imaging findings were evaluated by vascular surgery.  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  Postoperative amputation course complicated by oversedation, metabolic encephalopathy and soft blood pressures.  She has been followed by infectious disease with recommendation for cefazolin to be given with HD and Flagyl with an end date of 09/17/2022.  On 2/1, the patient developed worsening altered mental status with agonal respirations.  PCCM urgently consulted for transfer to ICU for intubation.   Husband requested full scope care prior to intubation in conversation with Dr. Candiss Norse.  Labs 2/1-NA 133, K4.4, CL 92, CO2 18, glucose 273, BUN 23, CR 3.18, WBC 18.3, Hgb 14.4, and platelets 176.  CXR post intubation with ETT, NG tube in good position.  New right lower lobe infiltrate concerning for aspiration and left lower lobe.  1/7 Admit with perforated appendix 1/8 CT-guided drainage of right pelvic abscess 1/15 CT angio of the abdominal aorta with severe atherosclerotic disease throughout abdomen and pelvis & bilateral lower extremities.  Severe left outflow disease with occlusion of left femoral-popliteal bypass graft, occlusion of native left SFA.  1/17 Drain check / injection per IR, noted appendiceal fistula  1/22 CT head negative 1/24 CT abdomen pelvis with drainage catheter in place in the region of complex abnormality in the right pelvic sidewall 1/29 left AKA per Dr. Donzetta Matters VVS 2/1 Tx to ICU, emergent intubation in setting of AMS and suspected aspiration.  Near respiratory arrest.  RLQ Pain with Perforated Appendicitis with Perippendiceal Abscess s/p CT Guided Drain Klebsiella and E-Coli from Abscess.  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  PVD Hypotension  Acute Metabolic Encephalopathy  ESRD on HD  HTN, HLD  HFrEF Complex Right Ovarian Mass  L Upper Pole Renal Lesion  DM II with Diabetic Retinopathy  Asthma / COPD Overlap  Anxiety  Adult Failure to Thrive  Severe Protein Calorie Malnutrition  Goals of care discussion  I met with patient's family this morning. Son daughter and niece. At this time they have a good understanding of her multiple medical comorbidities and the likelihood that she is not going to survive long-term. Her husband is going to return this afternoon around 2 PM. We will have a repeat meeting and discuss next steps.   Decision was made for comfort care transition. The patient passed her in the ICU.   Pertinent Labs and Studies  Significant Diagnostic Studies EEG adult  Result Date: 24-Sep-2022 Lora Havens, MD     2022/09/24  8:44 AM Patient Name: Destiny Day MRN: [id number] Epilepsy Attending: Lora Havens Referring Physician/Provider: Thurnell Lose, MD Date: 08/27/2022 Duration: 24.37 mins Patient history: 72 year old female with altered mental status.  EEG to evaluate for seizure Level of alertness: Awake AEDs during EEG study: None Technical aspects: This EEG study was done with scalp electrodes positioned according to the 10-20 International system of electrode placement. Electrical activity was reviewed with band pass filter of 1-'70Hz'$ , sensitivity of 7 uV/mm, display speed of 2m/sec with a '60Hz'$  notched filter applied as appropriate. EEG data were recorded continuously and  digitally stored.  Video monitoring was available and reviewed as appropriate. Description: No clear posterior dominant rhythm was seen.  EEG showed continuous generalized 3 to 6 Hz theta-delta slowing. Hyperventilation and photic stimulation were not performed.   ABNORMALITY - Continuous slow, generalized IMPRESSION: This study is suggestive of moderate diffuse  encephalopathy, nonspecific etiology. No seizures or epileptiform discharges were seen throughout the recording. Lora Havens   Portable Chest xray  Result Date: 09/02/2022 CLINICAL DATA:  Acute respiratory failure with hypoxia. Evaluate pleural effusion or pneumothorax. EXAM: PORTABLE CHEST 1 VIEW COMPARISON:  08/27/2022. FINDINGS: 0519 hours. Endotracheal tube tip projects over the midthoracic trachea. Enteric tube tip and side port project over the stomach. Improved aeration of the right lung base with minimal persistent linear opacity, likely atelectasis. No pleural effusion or pneumothorax. Stable cardiac and mediastinal contours with extensive aortic atherosclerosis. IMPRESSION: Improved aeration of the right lung base. Electronically Signed   By: Emmit Alexanders M.D.   On: 09-02-22 08:14   DG Chest Port 1 View  Result Date: 08/27/2022 CLINICAL DATA:  Acute respiratory distress. Endotracheal tube placement. EXAM: PORTABLE CHEST 1 VIEW COMPARISON:  Chest radiograph 08/27/2022 at 6:19 a.m. FINDINGS: The patient is rotated to the right with unchanged cardiomediastinal silhouette. An endotracheal tube has been placed and terminates proximally 3.5 cm above the carina. An enteric tube has been placed and terminates over the gastric fundus. Aortic atherosclerosis is again noted. There is new right basilar airspace opacity with associated volume loss including mild elevation of the right hemidiaphragm. The left lung remains clear. No sizable pleural effusion or pneumothorax is identified. IMPRESSION: 1. Endotracheal and enteric tubes as above. 2. New right lower lobe opacity favored to reflect atelectasis. Electronically Signed   By: Logan Bores M.D.   On: 08/27/2022 15:05   CT HEAD WO CONTRAST (5MM)  Result Date: 08/27/2022 CLINICAL DATA:  Delirium. EXAM: CT HEAD WITHOUT CONTRAST TECHNIQUE: Contiguous axial images were obtained from the base of the skull through the vertex without intravenous  contrast. RADIATION DOSE REDUCTION: This exam was performed according to the departmental dose-optimization program which includes automated exposure control, adjustment of the mA and/or kV according to patient size and/or use of iterative reconstruction technique. COMPARISON:  Head CT 08/17/2022. FINDINGS: Brain: No acute hemorrhage. Unchanged chronic small-vessel disease. Cortical gray-white differentiation is otherwise preserved. Prominence of the ventricles and sulci within normal limits for age. No extra-axial collection. Basilar cisterns are patent. Unchanged parenchymal calcification in the right pons. Vascular: No hyperdense vessel or unexpected calcification. Skull: Prior left frontotemporal craniotomy Sinuses/Orbits: Trace opacification of the left mastoid air cells. Paranasal sinuses are well aerated. Orbits are unremarkable. Other: None. IMPRESSION: 1. No acute intracranial abnormality. 2. Unchanged chronic small-vessel disease. Electronically Signed   By: Emmit Alexanders M.D.   On: 08/27/2022 10:48   DG Chest Port 1 View  Result Date: 08/27/2022 CLINICAL DATA:  Shortness of breath EXAM: PORTABLE CHEST 1 VIEW COMPARISON:  08/04/2022 FINDINGS: Or artifact from EKG leads. Normal heart size and mediastinal contours. Notably confluent atheromatous calcification along the aorta. No acute infiltrate or edema. No effusion or pneumothorax. No acute osseous findings. IMPRESSION: No active disease. Electronically Signed   By: Jorje Guild M.D.   On: 08/27/2022 06:30   CT ABDOMEN PELVIS W CONTRAST  Result Date: 08/19/2022 CLINICAL DATA:  Follow-up abscess drainage EXAM: CT ABDOMEN AND PELVIS WITH CONTRAST TECHNIQUE: Multidetector CT imaging of the abdomen and pelvis was performed using the standard protocol following bolus administration  of intravenous contrast. RADIATION DOSE REDUCTION: This exam was performed according to the departmental dose-optimization program which includes automated exposure  control, adjustment of the mA and/or kV according to patient size and/or use of iterative reconstruction technique. CONTRAST:  73m OMNIPAQUE IOHEXOL 350 MG/ML SOLN COMPARISON:  08/06/2022.  08/22/2022. 10/15/2020 FINDINGS: Lower chest: Lung bases are clear. Hepatobiliary: Layering stones and sludge remain evident in the gallbladder. No focal liver parenchymal lesion. Pancreas: Normal Spleen: Normal Adrenals/Urinary Tract: Adrenal glands remain normal. No change in appearance of the kidneys. No hydronephrosis. Bladder is collapsed but unremarkable. Stomach/Bowel: Stomach and small intestine are normal. Drainage catheter remains in place in the region of a complex abnormality along the right pelvic sidewall. Residual component continues to measure approximately 4.9 x 5.3 x 4.7 cm. The majority of this probably relates to a chronic pelvic sidewall mass lesion. Previously there was speculation that there could have been superinfection due to the presence of gas on the study 07/31/2022. I think at this point it is very difficult to determine if 0 where seeing simply represents the residual mass lesion or if there is any residual inflammatory component. Certainly there is no worsening or new finding. Vascular/Lymphatic: Aortic atherosclerosis. No aneurysm. IVC is normal. Reproductive: Previous hysterectomy.  No other pelvic finding. Other: No free fluid or air. Musculoskeletal: Ordinary chronic spinal degenerative changes. IMPRESSION: 1. Drainage catheter remains in place in the region of a complex abnormality along the right pelvic sidewall. Residual component continues to measure approximately 4.9 x 5.3 x 4.7 cm. The majority of this probably relates to a chronic pelvic sidewall mass lesion. Previously there was speculation that there could have been superinfection due to the presence of gas on the study 08/16/2022. I think at this point it is very difficult to determine if there is any residual inflammatory component.  Certainly there is no worsening or new finding. 2. Layering stones and sludge in the gallbladder. 3. Aortic atherosclerosis. Aortic Atherosclerosis (ICD10-I70.0). Electronically Signed   By: MNelson ChimesM.D.   On: 08/19/2022 08:21   CT HEAD WO CONTRAST (5MM)  Result Date: 08/17/2022 CLINICAL DATA:  Altered mental status EXAM: CT HEAD WITHOUT CONTRAST TECHNIQUE: Contiguous axial images were obtained from the base of the skull through the vertex without intravenous contrast. RADIATION DOSE REDUCTION: This exam was performed according to the departmental dose-optimization program which includes automated exposure control, adjustment of the mA and/or kV according to patient size and/or use of iterative reconstruction technique. COMPARISON:  05/07/2018 FINDINGS: Brain: There is no mass, hemorrhage or extra-axial collection. There is generalized atrophy without lobar predilection. Hypodensity of the white matter is most commonly associated with chronic microvascular disease. Vascular: Atherosclerotic calcification of the vertebral and internal carotid arteries at the skull base. No abnormal hyperdensity of the major intracranial arteries or dural venous sinuses. Skull: The visualized skull base, calvarium and extracranial soft tissues are normal. Sinuses/Orbits: No fluid levels or advanced mucosal thickening of the visualized paranasal sinuses. No mastoid or middle ear effusion. The orbits are normal. IMPRESSION: 1. No acute intracranial abnormality. 2. Generalized atrophy and findings of chronic microvascular disease. Electronically Signed   By: KUlyses JarredM.D.   On: 08/17/2022 01:07   IR Sinus/Fist Tube Chk-Non GI  Result Date: 08/12/2022 CLINICAL DATA:  72year old female with history of right pelvic abscess status post percutaneous drain placement on 08/03/2022 presenting with feculent output from drainage catheter. EXAM: IR CATHETER TUBE CHANGE COMPARISON:  None Available. CONTRAST:  10 mL Omnipaque  300-administered  via the existing percutaneous drain. FLUOROSCOPY TIME:  Two mGy TECHNIQUE: The patient was positioned supine on the fluoroscopy table. A preprocedural spot fluoroscopic image was obtained of the right lower quadrant and the existing percutaneous drainage catheter. Multiple spot fluoroscopic and radiographic images were obtained following the injection of a small amount of contrast via the existing percutaneous drainage catheter. The drainage catheter was then placed to bag drainage. FINDINGS: Contrast injection demonstrates minimal right lower quadrant fluid collection with fistulous connection to what appears to be the appendix which eventually drains into the cecum. 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 No drain manipulation performed. PLAN: Drain was placed to bag drainage from bulb suction. Recommend discontinuation of drain flushing. Follow-up in 3-4 weeks in [facility] with CT abdomen pelvis and fluoroscopic drain injection to assess for abscess/fistula resolution. Ruthann Cancer, MD [facility] Electronically Signed   By: Ruthann Cancer M.D.   On: 08/12/2022 10:19   CT ANGIO AO+BIFEM W & OR WO CONTRAST  Result Date: 08/10/2022 CLINICAL DATA:  72 year old with rest pain. EXAM: CT ANGIOGRAPHY OF ABDOMINAL AORTA WITH ILIOFEMORAL RUNOFF TECHNIQUE: Multidetector CT imaging of the abdomen, pelvis and lower extremities was performed using the standard protocol during bolus administration of intravenous contrast. Multiplanar CT image reconstructions and MIPs were obtained to evaluate the vascular anatomy. RADIATION DOSE REDUCTION: This exam was performed according to the departmental dose-optimization program which includes automated exposure control, adjustment of the mA and/or kV according to patient size and/or use of iterative reconstruction technique. CONTRAST:  173m  OMNIPAQUE IOHEXOL 350 MG/ML SOLN COMPARISON:  CT abdomen and pelvis 08/06/2022 FINDINGS: VASCULAR Aorta: Diffuse circumferential calcifications involving the abdominal aorta without aneurysm or dissection. No significant aortic stenosis. Celiac: Celiac artery is patent with diffuse atherosclerotic calcifications. At least mild narrowing at the origin of the celiac trunk. No evidence for aneurysm or dissection involving the branch vessels. SMA: SMA is patent with at least moderate stenosis at the origin due to large amount of calcified plaque. Renals: Bilateral renal arteries are patent with diffuse atherosclerotic plaque. High-grade stenosis involving the right renal artery origin. At least mild to moderate stenosis in the proximal left renal artery. No aneurysm or dissection involving the renal arteries. IMA: Patent RIGHT Lower Extremity Inflow: Circumferential calcifications in the right common iliac artery without significant stenosis. High-grade stenosis or occlusion at the origin of the right internal iliac artery. Diffuse calcified plaque throughout the right external iliac artery. Stenosis in the right external artery is likely hemodynamically significant with scattered areas of narrowing from calcified plaque. Outflow: Postsurgical changes in the right groin and the right common femoral artery is patent. Right profunda femoral arteries are calcified but patent. Native SFA is occluded. Right femoropopliteal bypass graft is tortuous but patent. Graft ties into the distal popliteal artery near the knee. There appears to be some intraluminal stenosis in the bypass graft. Limited evaluation of the distal graft due to motion artifact. Suspect that the distal graft is patent. Popliteal artery just distal to the graft is very limited due to motion artifact. Runoff: Limited evaluation of the runoff vessels due to diffuse calcifications. However, there appears to be flow in the posterior tibial artery and peroneal  artery. Evidence for segmental disease throughout the anterior tibial artery and limited evaluation. LEFT Lower Extremity Inflow: Circumferential calcifications in left common iliac artery without significant stenosis. Probable high-grade stenosis at the origin left internal iliac artery. Narrowing  in the mid and distal left external iliac artery from diffuse atherosclerotic disease. Outflow: Left common femoral artery is heavily calcified but patent. Origin of the left profunda femoral artery is heavily calcified. There is flow in the left profunda femoral arteries. Native left SFA is occluded. Left femoropopliteal bypass graft is occluded. Runoff: Limited evaluation due to calcified vessels. There appears to be flow in the left peroneal artery. Occlusive disease in the anterior tibial artery. There appears to be occlusive or segmental disease in the posterior tibial artery. Veins: No obvious venous abnormality within the limitations of this arterial phase study. Review of the MIP images confirms the above findings. NON-VASCULAR Lower chest: Bandlike atelectasis or scarring in the right lower lobe. Otherwise, the visualized lung bases are clear. Hepatobiliary: Evidence for cholelithiasis. Mild distention of the gallbladder is unchanged. Normal appearance of liver. Pancreas: Unremarkable. No pancreatic ductal dilatation or surrounding inflammatory changes. Spleen: Normal in size without focal abnormality. Adrenals/Urinary Tract: Adrenal glands are within normal limits. Atrophy in both kidneys compatible with history of end-stage renal disease. Evidence for bilateral renal cysts that to do not require dedicated follow-up. Urinary bladder is decompressed. Small amount of contrast within the urinary bladder. Stomach/Bowel: High-density material throughout the small and large bowel without dilatation or obstruction. Normal appearance of the stomach. Again noted is material just posterior to the rectum and near the tip  of the coccyx on image 135/6. This material measures roughly 2.0 x 2.2 cm and minimally changed from the exam on 08/06/2022. Concern for increased densities in the posterior subcutaneous tissues along the left posterior aspect of the coccyx on image 124/6. Developing subcutaneous fistula tract cannot be excluded. Lymphatic: No significant lymph node enlargement in the abdomen or pelvis. Reproductive: Status post hysterectomy. No evidence for a left adnexa mass. Again noted is complex structure in the region of the right adnexa containing a small focus of gas and small calcifications. There is a percutaneous drain along the anterior aspect of this complex collection. Collection measures approximately 4.6 x 3.9 cm on image 115/6 and roughly measured 4.1 x 3.5 cm on the exam from 08/06/2022. Other: No free fluid.  Negative for free air. Musculoskeletal: Bilateral transmetatarsal amputations. No acute bone abnormality. IMPRESSION: VASCULAR 1. Severe atherosclerotic disease throughout the abdomen, pelvis and bilateral lower extremities. Aortic Atherosclerosis (ICD10-I70.0). 2. Bilateral inflow disease. Stenosis involving bilateral external iliac arteries. 3. 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 4. Right femoropopliteal bypass graft is patent but limited evaluation due to motion artifact. 5. Very limited evaluation of the bilateral runoff vessels due to severe vascular calcifications. Likely single-vessel runoff on the left from the peroneal artery. At least 2 vessel runoff in the right lower extremity as described. 6. 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 7. Bilateral renal artery stenosis. NON-VASCULAR 1. Complex collection in the right hemipelvis has slightly enlarged in size since 08/06/2022. The anterior percutaneous drain is still in place and located along the  anterior aspect of the collection. Slightly limited evaluation of this structure due to the phase of contrast. 2. Concern for fluid or material just posterior to the rectum and just inferior to the coccyx. There may also be material or fluid tracking in the posterior pelvic subcutaneous tissues near the coccyx. Recommend attention to this area on follow up imaging. 3. Cholelithiasis. Electronically Signed   By: Scherrie Gerlach.D.  On: 08/10/2022 17:27   NM Hepatobiliary Liver Func  Result Date: 08/10/2022 CLINICAL DATA:  Concern for acute cholecystitis. EXAM: NUCLEAR MEDICINE HEPATOBILIARY IMAGING TECHNIQUE: Sequential images of the abdomen were obtained out to 60 minutes following intravenous administration of radiopharmaceutical. RADIOPHARMACEUTICALS:  5.2 mCi Tc-35m Choletec IV COMPARISON:  CT August 06, 2022. FINDINGS: Prompt uptake and biliary excretion of activity by the liver is seen. Gallbladder activity is visualized, consistent with patency of cystic duct. Biliary activity passes into small bowel, consistent with patent common bile duct. IMPRESSION: Patent cystic and common bile ducts without scintigraphic evidence of acute cholecystitis. Electronically Signed   By: JDahlia BailiffM.D.   On: 08/10/2022 13:39   [place] SITE RITE  Result Date: 08/10/2022 If Site Rite image not attached, placement could not be confirmed due to current cardiac rhythm.  CT ABDOMEN PELVIS W CONTRAST  Result Date: 08/06/2022 CLINICAL DATA:  Worsening right-sided abdominal pain. Status post right pelvic abscess drainage 3 days ago. EXAM: CT ABDOMEN AND PELVIS WITH CONTRAST TECHNIQUE: Multidetector CT imaging of the abdomen and pelvis was performed using the standard protocol following bolus administration of intravenous contrast. RADIATION DOSE REDUCTION: This exam was performed according to the departmental dose-optimization program which includes automated exposure control, adjustment of the mA and/or kV according to  patient size and/or use of iterative reconstruction technique. CONTRAST:  746mOMNIPAQUE IOHEXOL 350 MG/ML SOLN COMPARISON:  CT abdomen pelvis dated August 02, 2022. FINDINGS: Lower chest: No acute abnormality. Hepatobiliary: No focal liver abnormality. Multiple tiny layering gallstones again noted. No gallbladder wall thickening or biliary dilatation. Pancreas: Unremarkable. No pancreatic ductal dilatation or surrounding inflammatory changes. Spleen: Normal in size without focal abnormality. Adrenals/Urinary Tract: Adrenal glands are unremarkable. Similar bilateral renal atrophy with delayed renal enhancement and contrast excretion, consistent with medical renal disease. Unchanged round 2.1 cm heterogeneously enhancing lesion in the upper pole of the left kidney. Extensive bilateral renovascular calcifications again noted. Small bilateral renal simple cysts are unchanged. No follow-up imaging is recommended. No hydronephrosis. The bladder is decompressed. Stomach/Bowel: Stomach is within normal limits. The small bowel and colon are unremarkable. The tip of the appendix is again seen terminating in the right pelvic complex fluid collection (series 6, images 65-91), which is significantly decreased in size status post percutaneous drainage, with pigtail drainage catheter appropriately positioned. There are a few residual tiny foci of internal gas, but no significant residual fluid component. There are several loops of small bowel adjacent to this area, but without obvious fistula or small-bowel inflammatory change. This area remains inseparable from the right ovary. Vascular/Lymphatic: Aortic atherosclerosis. No enlarged abdominal or pelvic lymph nodes. Reproductive: Prior hysterectomy and left oophorectomy. Unchanged 3.8 x 3.0 cm complex right ovarian mass with internal calcification. Other: No new fluid collection. No free fluid or pneumoperitoneum. Unchanged small fat containing right inguinal and femoral hernias.  Musculoskeletal: No acute or significant osseous findings. IMPRESSION: 1. Significant interval decrease in size of the right pelvic abscess status post percutaneous drainage, with pigtail drainage catheter appropriately positioned. There are a few residual tiny foci of internal gas, but no significant residual fluid component. The tip of the appendix terminates within and is inseparable from this area, again suspicious for perforated appendicitis. There are several loops of small bowel adjacent to this area, but without obvious fistula or small-bowel inflammatory change. This area remains inseparable from the right ovary. 2. Unchanged 3.8 cm complex right ovarian mass with internal calcification, previously characterized on prior ultrasound. Gynecological consultation  was recommended at that time. 3. Unchanged 2.1 cm heterogeneously enhancing left upper pole renal lesion, suspicious for primary renal neoplasm. Outpatient non-emergent follow-up renal protocol CT or MRI with and without contrast is recommended for further evaluation. 4. Unchanged cholelithiasis. 5.  Aortic Atherosclerosis (ICD10-I70.0). Electronically Signed   By: Titus Dubin M.D.   On: 08/06/2022 18:53   DG CHEST PORT 1 VIEW  Result Date: 08/04/2022 CLINICAL DATA:  Hypoxia EXAM: PORTABLE CHEST 1 VIEW COMPARISON:  None Available. FINDINGS: Normal mediastinum and cardiac silhouette. Normal pulmonary vasculature. No evidence of effusion, infiltrate, or pneumothorax. No acute bony abnormality. IMPRESSION: No acute cardiopulmonary process. Electronically Signed   By: Suzy Bouchard M.D.   On: 08/04/2022 15:15   CT GUIDED PERITONEAL/RETROPERITONEAL FLUID DRAIN BY PERC CATH  Result Date: 08/03/2022 INDICATION: 72 year old female referred for drainage right pelvic abscess EXAM: CT-GUIDED DRAINAGE RIGHT PELVIC ABSCESS TECHNIQUE: Multidetector CT imaging of the pelvis was performed following the standard protocol without IV contrast. RADIATION DOSE  REDUCTION: This exam was performed according to the departmental dose-optimization program which includes automated exposure control, adjustment of the mA and/or kV according to patient size and/or use of iterative reconstruction technique. MEDICATIONS: The patient is currently admitted to the hospital and receiving intravenous antibiotics. The antibiotics were administered within an appropriate time frame prior to the initiation of the procedure. ANESTHESIA/SEDATION: Moderate (conscious) sedation was employed during this procedure. A total of Versed 1.0 mg and Fentanyl 50 mcg was administered intravenously by the radiology nurse. Total intra-service moderate Sedation Time: 20 minutes. The patient's level of consciousness and vital signs were monitored continuously by radiology nursing throughout the procedure under my direct supervision. COMPLICATIONS: None PROCEDURE: Informed written consent was obtained from the patient after a thorough discussion of the procedural risks, benefits and alternatives. All questions were addressed. Maximal Sterile Barrier Technique was utilized including caps, mask, sterile gowns, sterile gloves, sterile drape, hand hygiene and skin antiseptic. A timeout was performed prior to the initiation of the procedure. Patient was position supine on the CT gantry table. Scout CT was acquired for planning purposes. The patient was then prepped and draped in usual sterile fashion. 1% lidocaine was used for local anesthesia. A small stab incision was made with 11 blade scalpel. Trocar needle was then advanced into the abscess associated with the right adnexa. Using modified Seldinger technique, 10 French drain was placed. Approximately 30 cc of frankly purulent material aspirated. Culture was sent for analysis. Drain was sutured in position and attached to bulb suction. Patient tolerated the procedure well and remained hemodynamically stable throughout. No complications were encountered and no  significant blood loss. IMPRESSION: Status post CT-guided drainage of right pelvic abscess. Signed, Dulcy Fanny. Nadene Rubins, RPVI [facility] Electronically Signed   By: Corrie Mckusick D.O.   On: 08/03/2022 15:27   CT ABDOMEN PELVIS W CONTRAST  Result Date: 08/15/2022 CLINICAL DATA:  Right groin and right abdominal pain. EXAM: CT ABDOMEN AND PELVIS WITH CONTRAST TECHNIQUE: Multidetector CT imaging of the abdomen and pelvis was performed using the standard protocol following bolus administration of intravenous contrast. RADIATION DOSE REDUCTION: This exam was performed according to the departmental dose-optimization program which includes automated exposure control, adjustment of the mA and/or kV according to patient size and/or use of iterative reconstruction technique. CONTRAST:  58m OMNIPAQUE IOHEXOL 350 MG/ML SOLN COMPARISON:  07/31/2022 FINDINGS: Lower chest: No acute abnormality. Hepatobiliary: Cholelithiasis without pericholecystic inflammatory change noted. The liver is unremarkable. No  intra or extrahepatic biliary ductal dilation. Pancreas: Unremarkable Spleen: Unremarkable Adrenals/Urinary Tract: The adrenal glands are unremarkable. The kidneys are normal in position. There is mild to moderate renal cortical atrophy bilaterally. Multiple simple cortical cysts are seen within the kidneys bilaterally. Within the upper pole of the left kidney, however, a hypoenhancing 2.1 cm lesion is identified suspicious for AA primary renal neoplasm. Vascular calcifications noted within the renal hila bilaterally. No definite urinary renal or ureteral calculi. No hydronephrosis. The bladder is unremarkable. Stomach/Bowel: The stomach, small bowel, and large bowel are unremarkable. The previously noted loculated collection along the right pelvic sidewall is again identified and is enlarged, measuring 3.9 x 5.3 cm at axial image # 69/3. As noted on prior  examination, there is irregular mural enhancement, enhancing internal septa, and mild surrounding inflammatory stranding. Additionally, on the current examination, there has developed gas within the superior aspect of the mass, best seen on axial image # 68/3. When compared to remote prior examination of 10/15/2020, this is seen immediately adjacent to the appendix and complex right adnexal cystic lesion. Altogether, the findings are suspicious for a perforated appendicitis with a periappendiceal abscess with secondary involvement of the previously noted adnexal mass or a tubo-ovarian abscess. A malignant lesion is considered less likely given its relatively rapid evolution though a secondarily infected malignancy could appear in this fashion. No free intraperitoneal gas or fluid. No evidence of obstruction. Vascular/Lymphatic: Extensive aortoiliac atherosclerotic calcification. Extensive visceral arteriosclerosis. 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 No pathologic adenopathy within the abdomen and pelvis. Reproductive: Uterus absent.  No adnexal masses on the left. Other: Small right femoral hernia is again identified though bowel within the hernia sac noted on prior examination has spontaneously reduced in the interval. Musculoskeletal: Degenerative changes are seen within the lumbar spine. No acute bone abnormality. No lytic or blastic bone lesion. IMPRESSION: 1. Interval enlargement of the previously noted loculated collection along the right pelvic sidewall with irregular mural enhancement, enhancing internal septa, and mild surrounding inflammatory stranding. Additionally, there has developed gas within the superior aspect of the mass, best seen on axial image # 68/3. When  compared to remote prior examination of 10/15/2020, this is seen immediately adjacent to the appendix and complex right adnexal cystic lesion. Altogether, the findings are suspicious for a perforated appendicitis with a periappendiceal abscess with probable secondary involvement of the previously noted adnexal mass or a tubo-ovarian abscess. A malignant lesion is considered less likely given its relatively rapid evolution though a secondarily infected malignancy could appear in this fashion. Surgical consultation is recommended. 2. 2.1 cm hypoenhancing lesion within the upper pole of the left kidney suspicious for a primary renal neoplasm. Dedicated renal mass protocol CT or MRI examination is recommended for evaluation once the patient's acute issues have resolved. 3. Cholelithiasis. 4. Small right fat containing femoral hernia. Previously noted bowel within the hernia sac has spontaneously reduced. Aortic Atherosclerosis (ICD10-I70.0). Electronically Signed   By: Fidela Salisbury M.D.   On: 08/26/2022 20:35   US Abdomen Limited RUQ (LIVER/GB)  Result Date: 08/09/2022 CLINICAL DATA:  Right-sided abdominal pain EXAM: ULTRASOUND ABDOMEN LIMITED RIGHT UPPER QUADRANT COMPARISON:  CT abdomen and pelvis 07/31/2022 FINDINGS: Gallbladder: Small gallstones are present. There is no gallbladder wall thickening or pericholecystic fluid. Sonographic Percell Miller sign is positive per sonographer. Common bile duct:  Diameter: 5.1 mm Liver: No focal lesion identified. Increase in parenchymal echogenicity. Portal vein is patent on color Doppler imaging with normal direction of blood flow towards the liver. Other: 1.4 x 2.2 x 1.4 cm right renal cyst. Right kidney appears echogenic. IMPRESSION: 1. Cholelithiasis with positive sonographic Murphy sign. No gallbladder wall thickening or pericholecystic fluid. Findings are equivocal for acute cholecystitis. 2. Increased echogenicity of the liver. This is a nonspecific finding but may be seen  in the setting of hepatic steatosis. 3. Echogenic right kidney, suggesting medical renal disease. Electronically Signed   By: Ronney Asters M.D.   On: 08/11/2022 18:35    Microbiology Recent Results (from the past 240 hour(s))  MRSA Next Gen by PCR, Nasal     Status: None   Collection Time: 08/27/22  4:13 PM   Specimen: Nasal Mucosa; Nasal Swab  Result Value Ref Range Status   MRSA by PCR Next Gen NOT DETECTED NOT DETECTED Final    Comment: (NOTE) The GeneXpert MRSA Assay (FDA approved for NASAL specimens only), is one component of a comprehensive MRSA colonization surveillance program. It is not intended to diagnose MRSA infection nor to guide or monitor treatment for MRSA infections. Test performance is not FDA approved in patients less than 31 years old. Performed at [facility], [place] [address] [place], [place] [address]   Culture, Respiratory w Gram Stain     Status: None   Collection Time: 08/27/22  4:54 PM   Specimen: Bronchoalveolar Lavage; Respiratory  Result Value Ref Range Status   Specimen Description BRONCHIAL ALVEOLAR LAVAGE  Final   Special Requests NONE  Final   Gram Stain   Final    FEW WBC PRESENT,BOTH PMN AND MONONUCLEAR FEW YEAST WITH PSEUDOHYPHAE Performed at [facility], [place] [address], [place], [place] [address]    Culture ABUNDANT CANDIDA ALBICANS  Final   Report Status 08/30/2022 FINAL  Final    Lab Basic Metabolic Panel: Recent Labs  Lab 08/27/22 0319 08/27/22 1456 08/27/22 1551 09-26-2022 0436 2022-09-26 0437  NA 133* 135 135 135 130*  K 4.4 4.1 3.8 4.0 4.3  CL 92* 97*  --  91*  --   CO2 18* 18*  --  12*  --   GLUCOSE 273* 111*  --  275*  --   BUN 23 27*  --  36*  --   CREATININE 3.18* 4.08*  --  4.38*  --   CALCIUM 10.6* 11.7*  --  10.7*  --   PHOS 5.3*  --   --   --   --    Liver Function Tests: Recent Labs  Lab 08/27/22 0319 08/27/22 1456  AST  --  423*  ALT  --  30  ALKPHOS  --  68  BILITOT  --  1.2  PROT  --  7.1   ALBUMIN 2.2* 2.2*   No results for input(s): "LIPASE", "AMYLASE" in the last 168 hours. Recent Labs  Lab 08/27/22 1456  AMMONIA 57*   CBC: Recent Labs  Lab 08/26/22 0709 08/27/22 0319 08/27/22 1456 08/27/22 1551 Sep 26, 2022 0436 26-Sep-2022 0437  WBC 13.7* 18.3* 25.8*  --  29.0*  --   HGB 11.6* 14.4 13.6 13.3 11.1* 11.9*  HCT 35.2* 45.0 43.8 39.0 35.3* 35.0*  MCV 97.0 97.8 101.6*  --  101.7*  --   PLT 210 176 239  --  238  --    Cardiac Enzymes: No results for input(s): "CKTOTAL", "CKMB", "CKMBINDEX", "  TROPONINI" in the last 168 hours. Sepsis Labs: Recent Labs  Lab 08/26/22 0709 08/27/22 0319 08/27/22 1456 2022-09-13 0436  WBC 13.7* 18.3* 25.8* 29.0*    Procedures/Operations   ETT   Alven Alverio L Hokulani Rogel 09/01/2022, 2:41 PM

## 2022-09-25 NOTE — Progress Notes (Signed)
eLink Physician-Brief Progress Note Patient Name: Destiny Day DOB: 09/18/1950 MRN: [id number]   Date of Service  09/24/2022  HPI/Events of Note  ABG reviewed.  eICU Interventions  Bicarb gtt rate increased to 150 ml / hour.        Kerry Kass Corinthia Helmers 09-24-22, 5:12 AM

## 2022-09-25 NOTE — Progress Notes (Signed)
RN asked for bedside assessment for change in heart rhythm and blood pressure. Patient on maximum levophed support (technically higher ceiling approved per [facility] MD overnight) and experiencing progressive hypotension & rhythm changes despite support.  She remains on mechanical ventilation. Met with daughter and son in conference room with [place] NP, Randall Hiss.  We reviewed ongoing decline despite escalating interventions. They called their father and want him to come back to the hospital.  We discussed possible options for how to best care for her moving forward - including transition off mechanical ventilation vs continuing current care and allowing her to pass on mechanical ventilation. Support offered.      Noe Gens, MSN, APRN, NP-C, AGACNP-BC [facility] Sep 15, 2022, 1:20 PM   Please see [email or web address] for pager details.   From 7A-7P if no response, please call [phone] After hours, please call ELink [phone]

## 2022-09-25 NOTE — Progress Notes (Addendum)
Husband arrived, updated at children's request.  Reviewed current situation with hypotension despite increased blood pressure agents, mechanical ventilation etc.  He expresses his enduring love for his wife and that he knows she has suffered over the last 12 years.  He does not want to further prolong her suffering and would like to transition of the ventilator and focus on her comfort.     Transitioned off mechanical ventilation with children & granddaughter in the room. Husband retrieved to bedside after vent removal.  Family support offered.  PRN medications for evidence of pain or shortness of breath.        Noe Gens, MSN, APRN, NP-C, AGACNP-BC [facility] 09-12-22, 1:25 PM   Please see [email or web address] for pager details.   From 7A-7P if no response, please call [phone] After hours, please call ELink [phone]

## 2022-09-25 DEATH — deceased

## 2023-02-18 ENCOUNTER — Ambulatory Visit: Payer: Medicare Other | Admitting: Cardiology
